# Patient Record
Sex: Female | Born: 1945 | Race: Black or African American | Hispanic: No | Marital: Single | Smoking: Former smoker
Health system: Southern US, Community
[De-identification: ages and names within clinical notes are randomized; demographics above are authoritative.]

## PROBLEM LIST (undated history)

## (undated) DIAGNOSIS — L0231 Cutaneous abscess of buttock: Secondary | ICD-10-CM

## (undated) DIAGNOSIS — L409 Psoriasis, unspecified: Secondary | ICD-10-CM

## (undated) DIAGNOSIS — K5090 Crohn's disease, unspecified, without complications: Secondary | ICD-10-CM

## (undated) DIAGNOSIS — K56609 Unspecified intestinal obstruction, unspecified as to partial versus complete obstruction: Secondary | ICD-10-CM

## (undated) DIAGNOSIS — K449 Diaphragmatic hernia without obstruction or gangrene: Secondary | ICD-10-CM

## (undated) HISTORY — PX: OTHER SURGICAL HISTORY: SHX169

---

## 1979-08-08 HISTORY — PX: APPENDECTOMY: SHX54

## 1989-08-07 HISTORY — PX: CHOLECYSTECTOMY: SHX55

## 1997-12-07 HISTORY — PX: OTHER SURGICAL HISTORY: SHX169

## 2005-02-23 ENCOUNTER — Ambulatory Visit: Payer: Self-pay | Admitting: Internal Medicine

## 2005-05-28 ENCOUNTER — Ambulatory Visit: Payer: Self-pay | Admitting: Internal Medicine

## 2005-07-22 ENCOUNTER — Ambulatory Visit: Payer: Self-pay | Admitting: Internal Medicine

## 2006-02-03 ENCOUNTER — Ambulatory Visit: Payer: Self-pay | Admitting: Internal Medicine

## 2006-02-13 ENCOUNTER — Inpatient Hospital Stay (HOSPITAL_COMMUNITY): Admission: EM | Admit: 2006-02-13 | Discharge: 2006-02-18 | Payer: Self-pay | Admitting: Emergency Medicine

## 2006-02-15 ENCOUNTER — Ambulatory Visit: Payer: Self-pay | Admitting: Internal Medicine

## 2006-02-22 ENCOUNTER — Ambulatory Visit: Payer: Self-pay | Admitting: Internal Medicine

## 2006-04-19 ENCOUNTER — Ambulatory Visit: Payer: Self-pay | Admitting: Internal Medicine

## 2006-07-08 ENCOUNTER — Ambulatory Visit: Payer: Self-pay | Admitting: Internal Medicine

## 2006-10-14 ENCOUNTER — Ambulatory Visit: Payer: Self-pay | Admitting: Internal Medicine

## 2007-02-27 IMAGING — CR DG ABDOMEN ACUTE W/ 1V CHEST
3 series · 3 of 3 positions shown · non-contrast
Comparison: none

CLINICAL DATA: Nausea, vomiting, diarrhea, and upper abdominal pain.  Status post bowel resection for rectal abscess.  Crohn's disease.
ACUTE ABDOMINAL SERIES WITH CHEST ? 3 VIEWS:

[view not recorded (1 of 3)]
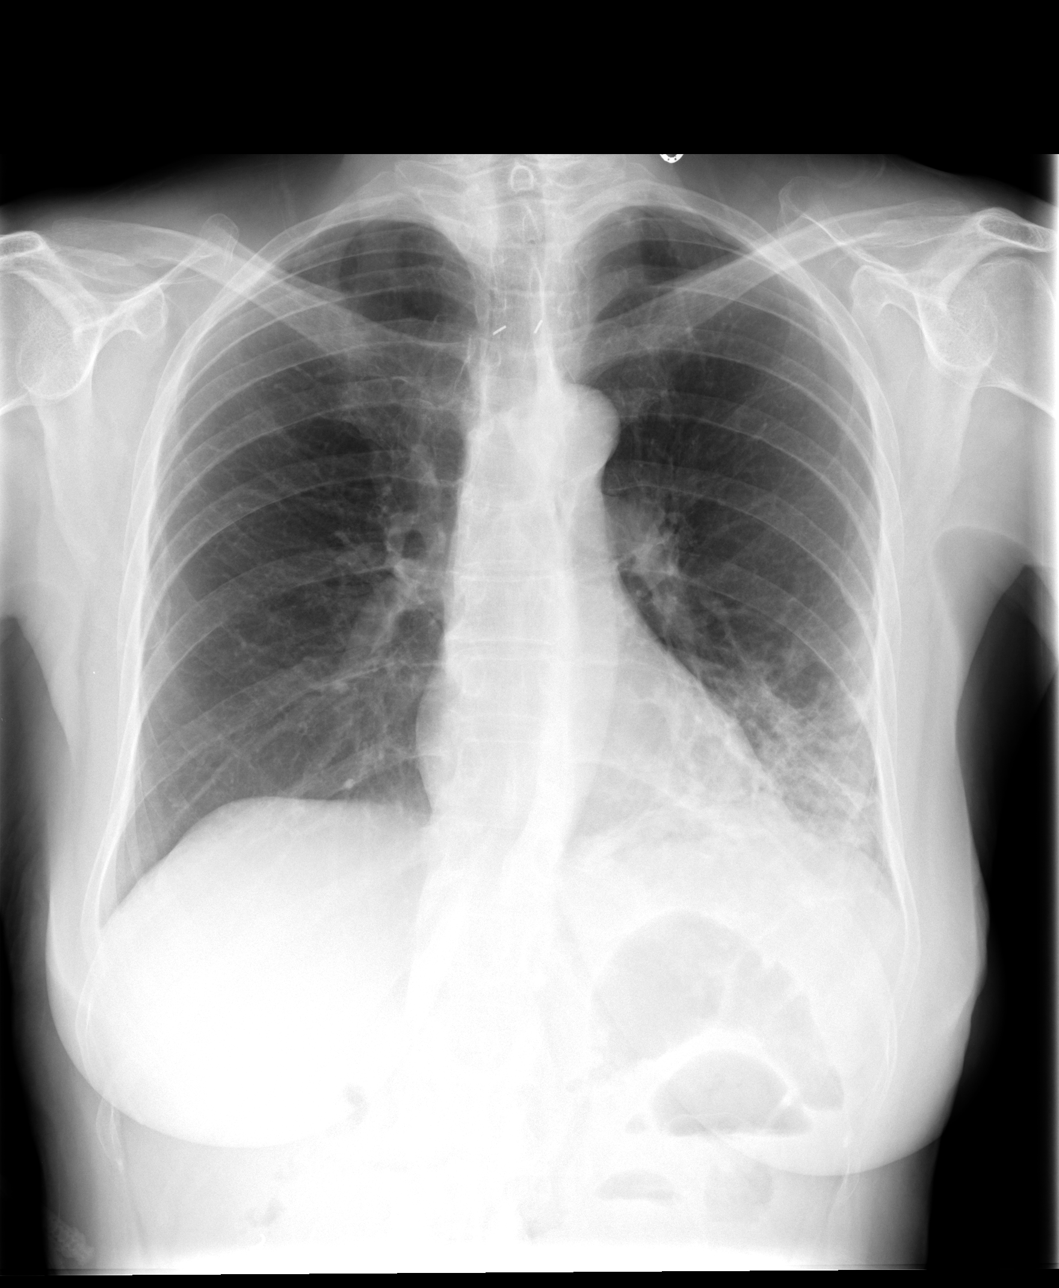

[view not recorded (2 of 3)]
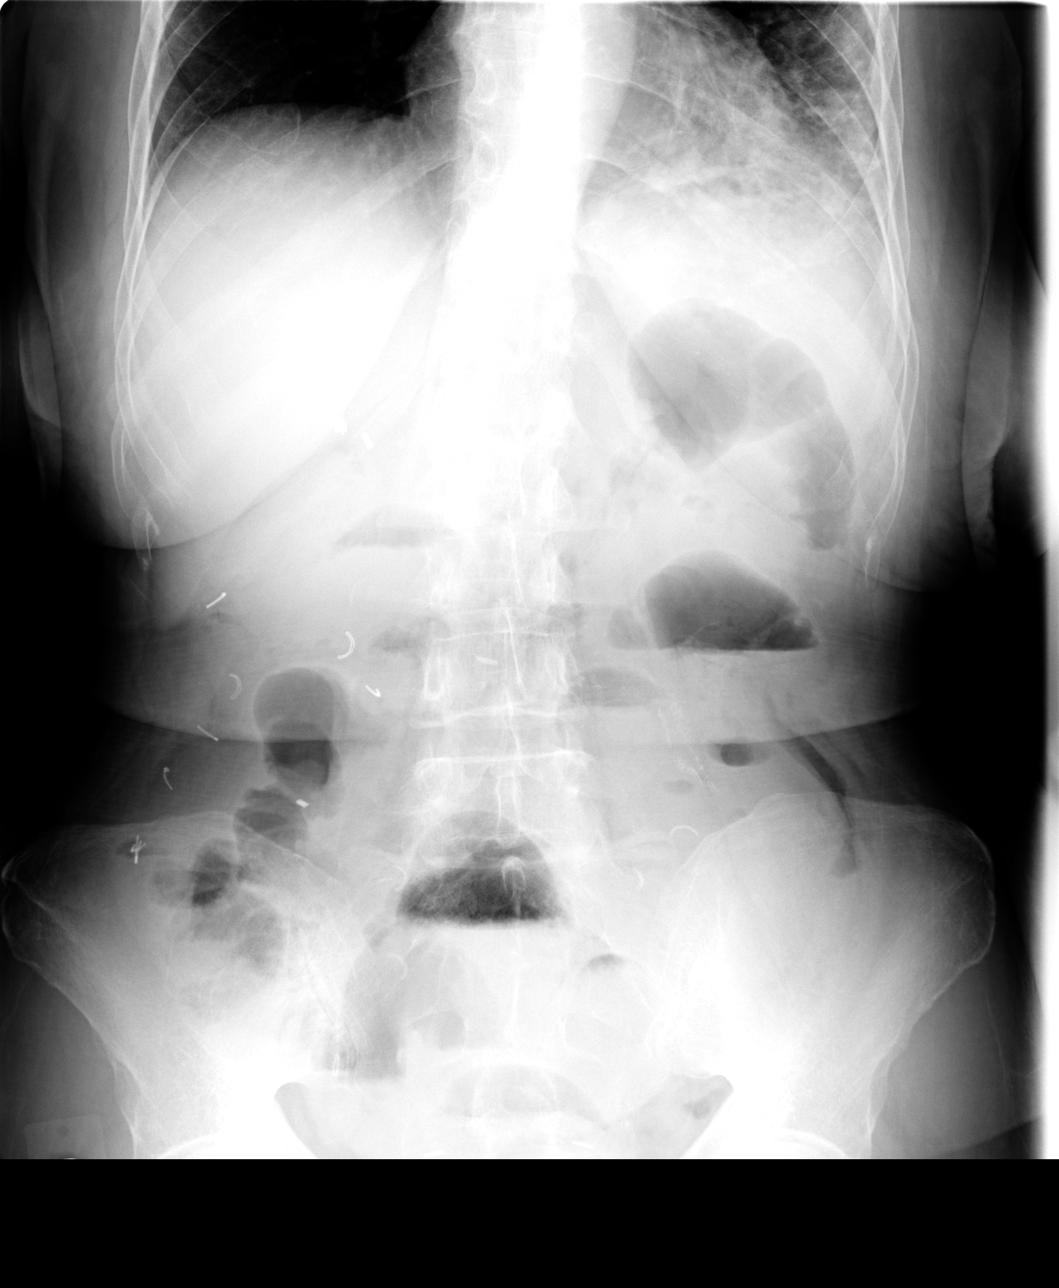

[view not recorded (3 of 3)]
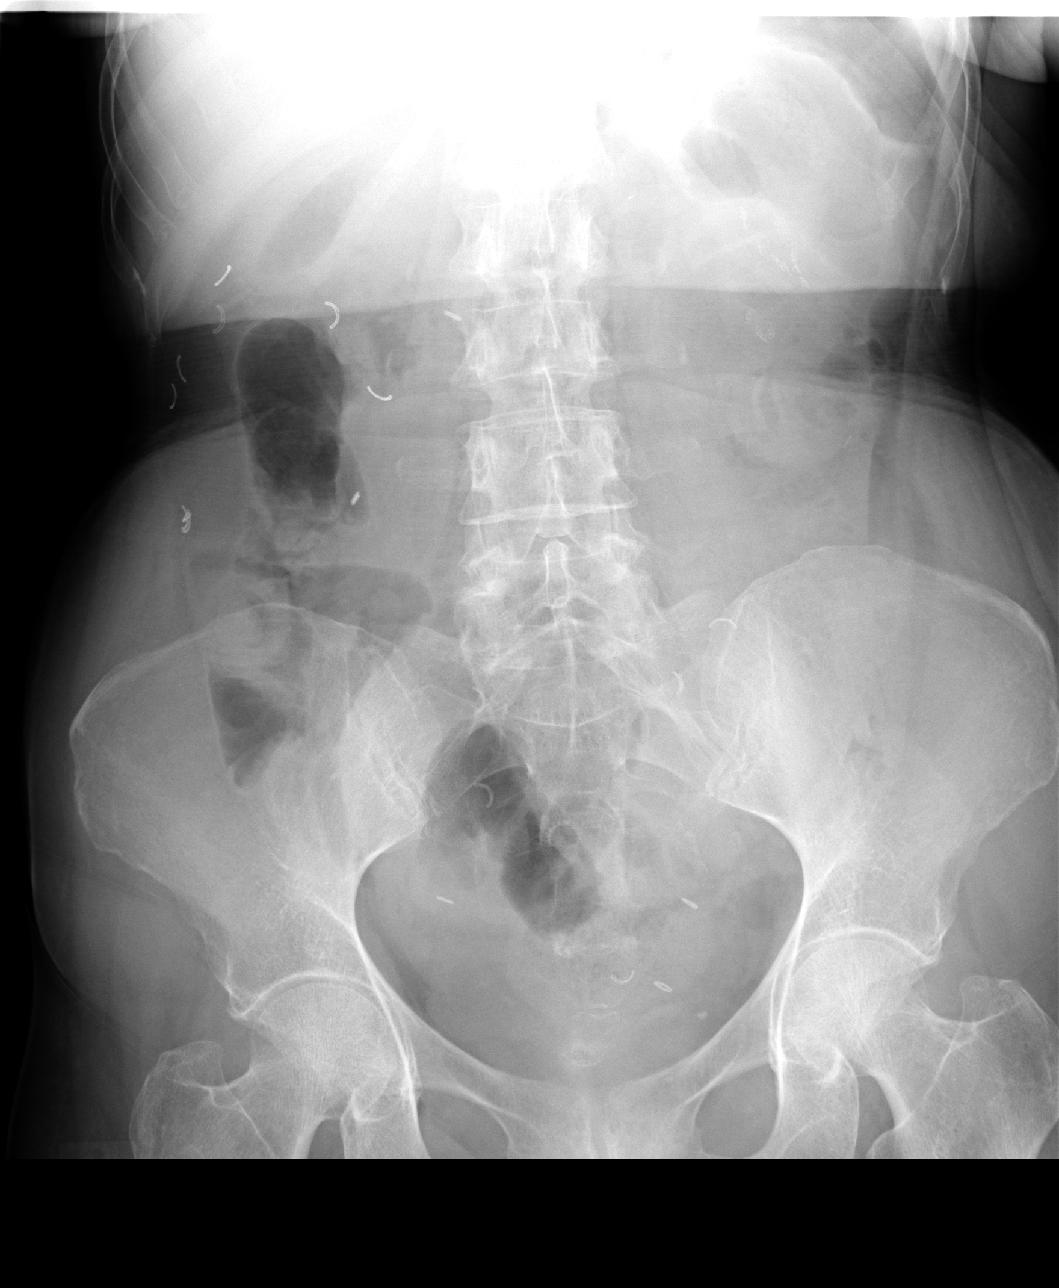

[3 of 3 positions shown; findings below may reference images not displayed]

FINDINGS: There is an irregular parenchymal density in the left lung base, the acuteness of which is uncertain.  This may represent pneumonia however, it is possible that chronic scarring may produce this appearance.  No free intraperitoneal air.  There are dilated loops of small bowel in the upper and lower abdomen with air-fluid levels on the erect view.  There is a paucity of colonic gas.  The findings are suspicious for small bowel obstruction.
IMPRESSION: Radiographic findings would be compatible with small bowel obstruction.  
Left lower lung zone density suspicious for pneumonia until proven otherwise.
Unfortunately, there are no prior chest x-rays available for correlation.
Consider follow-up chest post-treatment to reassess the left base density.

## 2007-03-01 IMAGING — CR DG ABDOMEN ACUTE W/ 1V CHEST
3 series · 3 of 3 positions shown · non-contrast
Comparison: 02/13/06.

CLINICAL DATA: Abdominal pain. 
 ACUTE ABDOMINAL SERIES:

[view not recorded (1 of 3)]
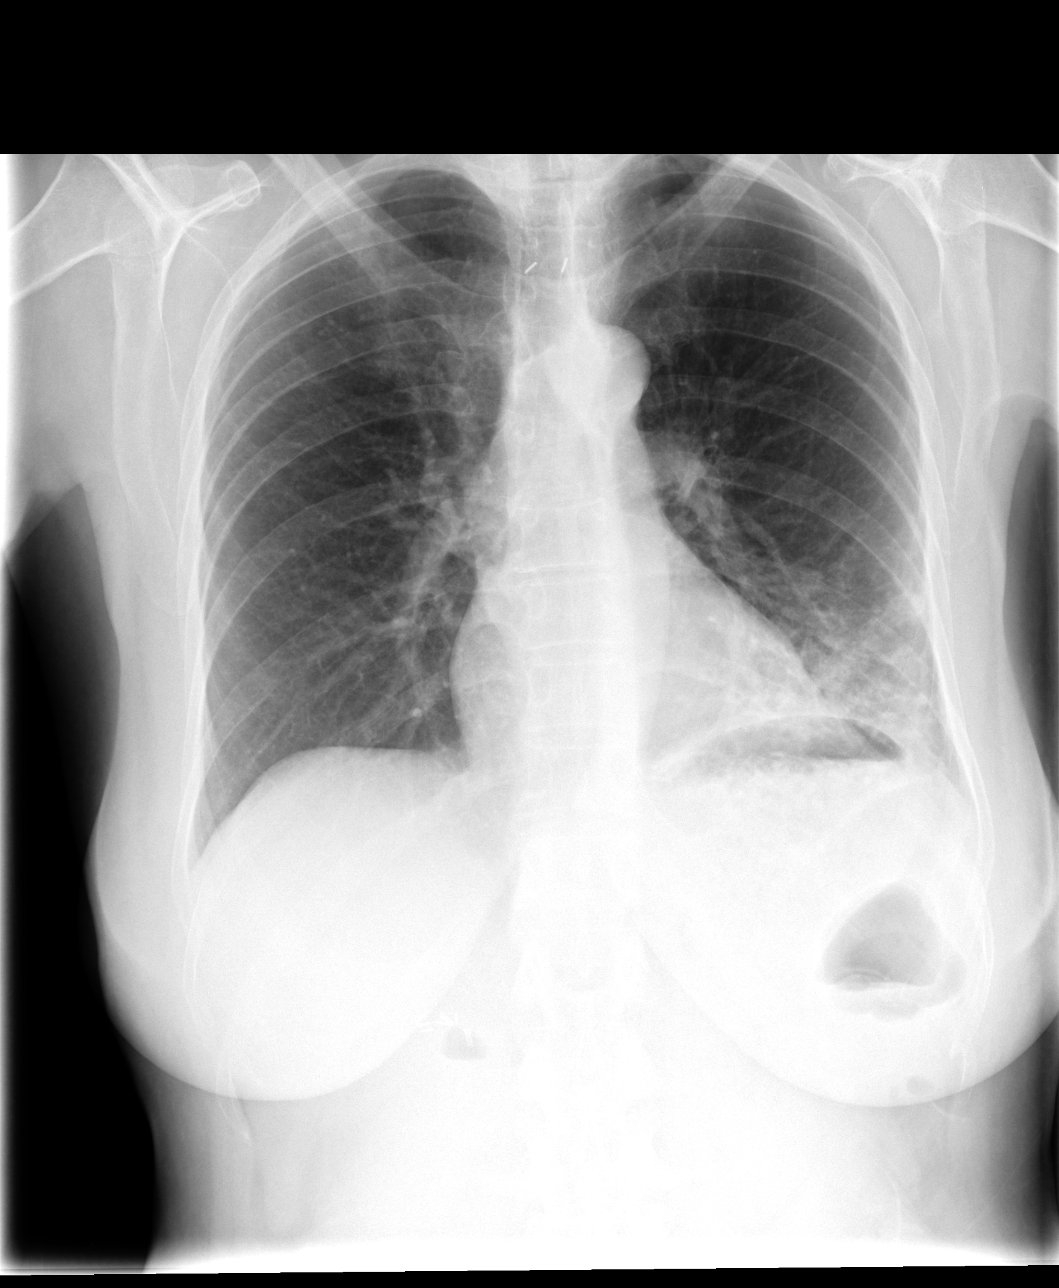

[view not recorded (2 of 3)]
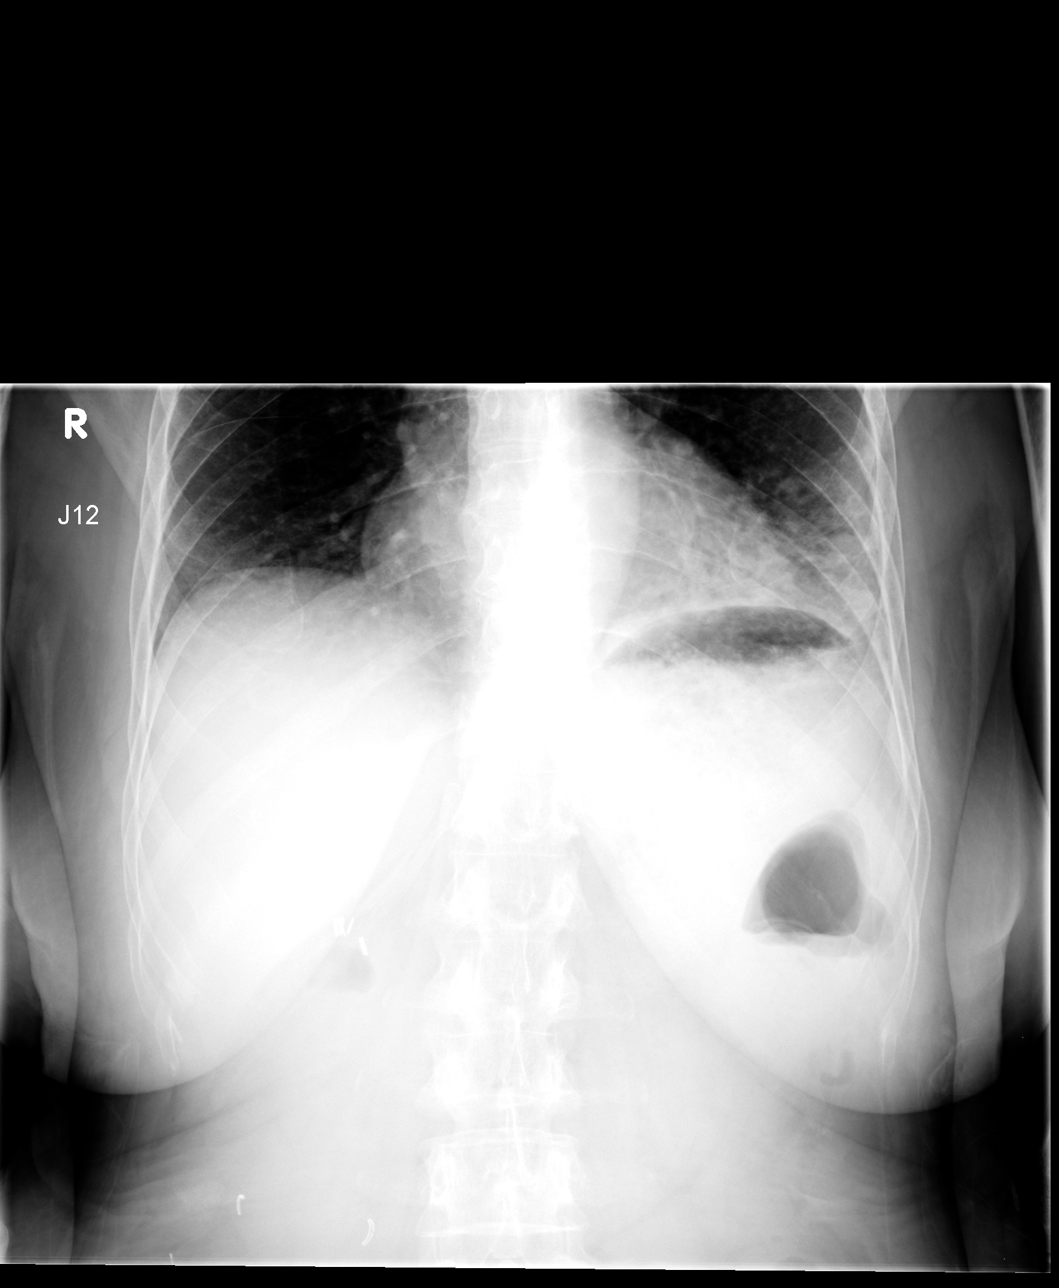

[view not recorded (3 of 3)]
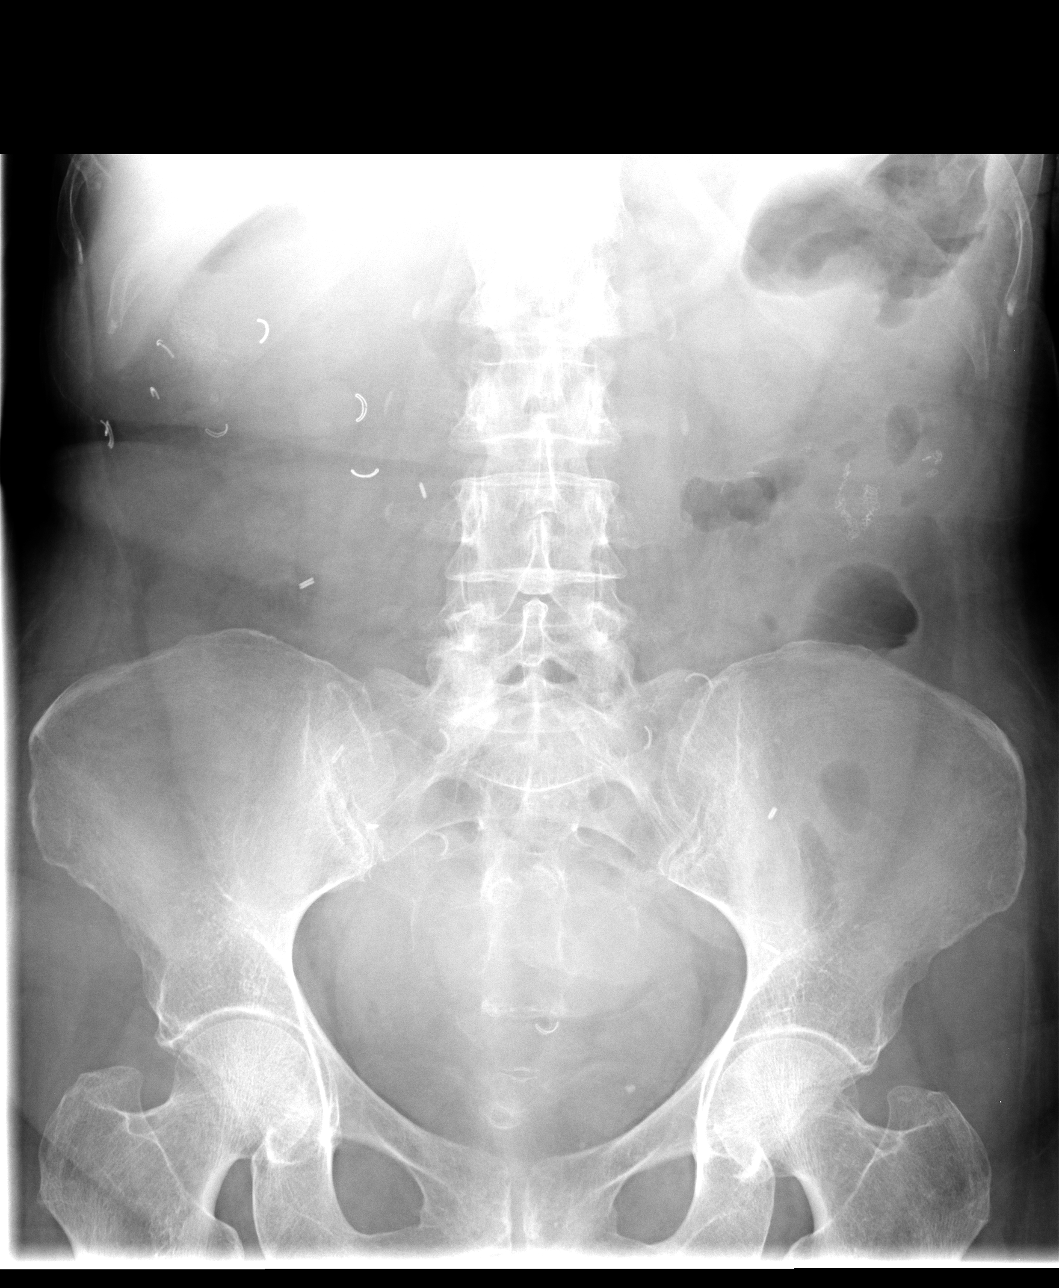

[3 of 3 positions shown; findings below may reference images not displayed]

FINDINGS: Prominent markings remain at the left lung base.  Although this could be due to atelectasis or scarring, left lower lobe pneumonia is also a consideration.  The right lung is clear.  The heart is within normal limits is size.  
 The slight small bowel distention appears improved.  On the erect view no significant air fluid levels are seen and no free air is noted.
IMPRESSION: 1.  Opacity remains at the left base suspicious for left lower lobe pneumonia. 
 2.  Improvement in small bowel distention.  No free air.

## 2007-08-22 ENCOUNTER — Emergency Department (HOSPITAL_COMMUNITY): Admission: EM | Admit: 2007-08-22 | Discharge: 2007-08-22 | Payer: Self-pay | Admitting: Emergency Medicine

## 2007-10-18 ENCOUNTER — Emergency Department (HOSPITAL_COMMUNITY): Admission: EM | Admit: 2007-10-18 | Discharge: 2007-10-18 | Payer: Self-pay | Admitting: *Deleted

## 2007-11-23 ENCOUNTER — Emergency Department (HOSPITAL_COMMUNITY): Admission: EM | Admit: 2007-11-23 | Discharge: 2007-11-23 | Payer: Self-pay | Admitting: Emergency Medicine

## 2007-12-08 HISTORY — PX: COLOSTOMY TAKEDOWN: SHX5783

## 2007-12-08 HISTORY — PX: BOWEL RESECTION: SHX1257

## 2007-12-08 HISTORY — PX: COLOSTOMY: SHX63

## 2007-12-14 ENCOUNTER — Emergency Department (HOSPITAL_COMMUNITY): Admission: EM | Admit: 2007-12-14 | Discharge: 2007-12-14 | Payer: Self-pay | Admitting: *Deleted

## 2008-05-20 ENCOUNTER — Emergency Department (HOSPITAL_COMMUNITY): Admission: EM | Admit: 2008-05-20 | Discharge: 2008-05-21 | Payer: Self-pay | Admitting: Emergency Medicine

## 2008-06-10 ENCOUNTER — Emergency Department (HOSPITAL_COMMUNITY): Admission: EM | Admit: 2008-06-10 | Discharge: 2008-06-11 | Payer: Self-pay | Admitting: Emergency Medicine

## 2008-08-15 ENCOUNTER — Emergency Department (HOSPITAL_COMMUNITY): Admission: EM | Admit: 2008-08-15 | Discharge: 2008-08-16 | Payer: Self-pay | Admitting: Emergency Medicine

## 2008-10-31 IMAGING — CR DG ABDOMEN ACUTE W/ 1V CHEST
3 series · 3 of 3 positions shown · non-contrast
Comparison: 02/15/06.

CLINICAL DATA: 60-year-old female, Crohn?s disease.  Abdominal pain.  
ACUTE ABDOMINAL SERIES - 3 VIEW;

[w chest pa]
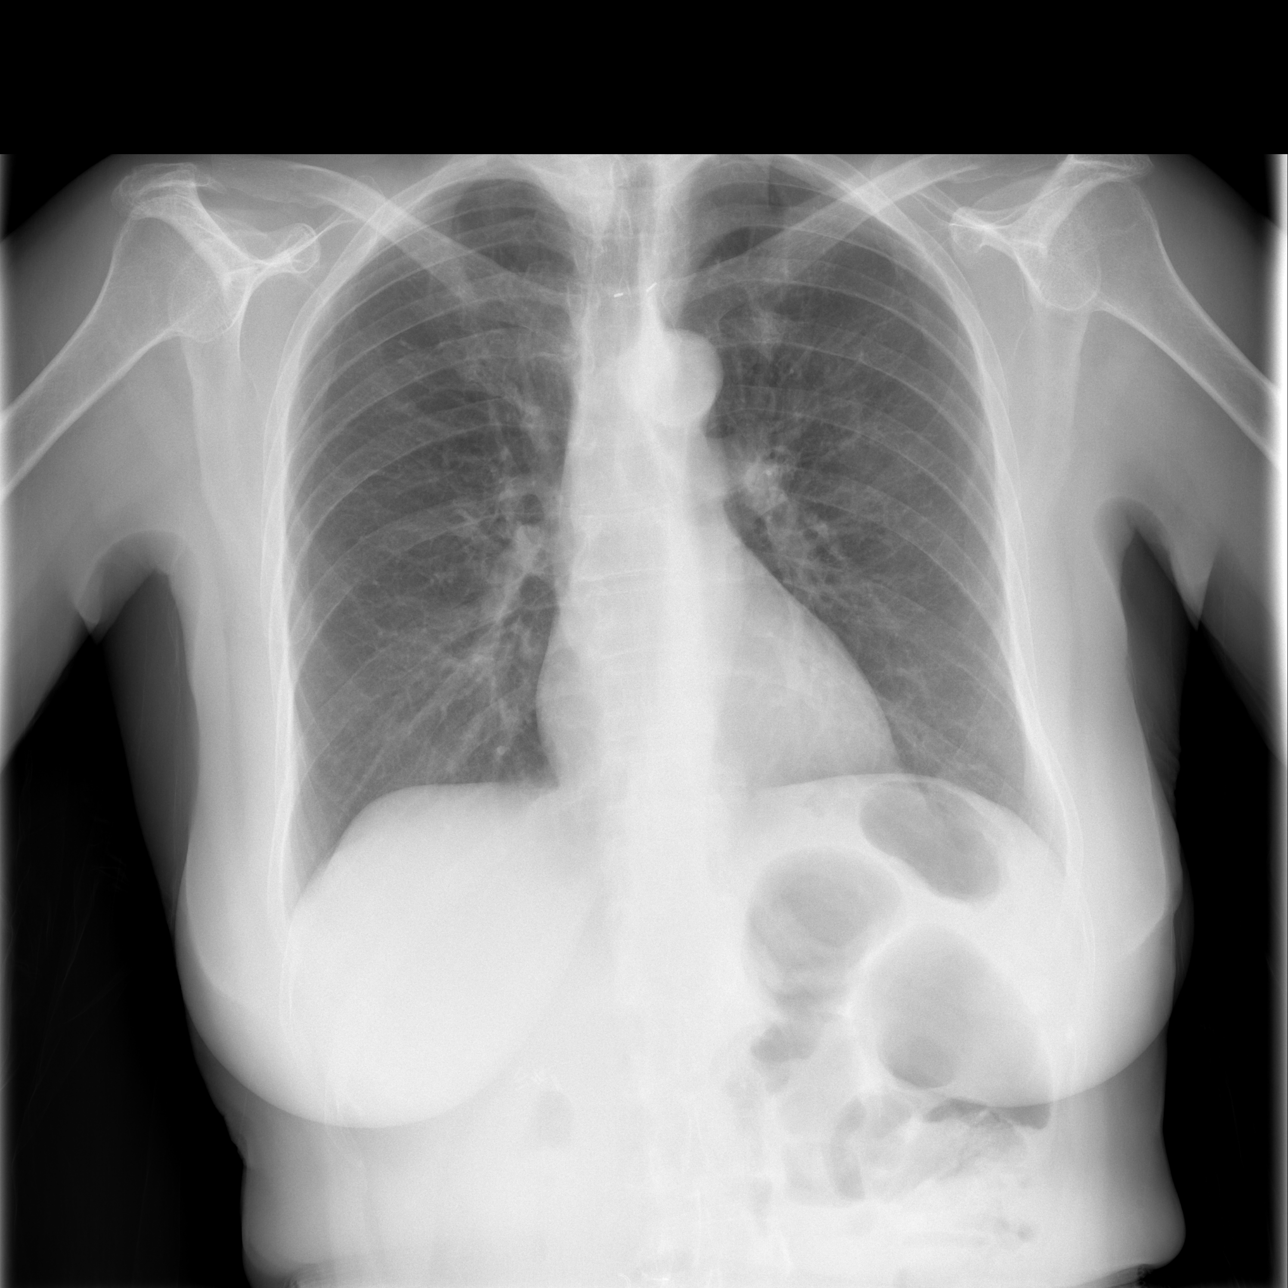

[w abdomen upright]
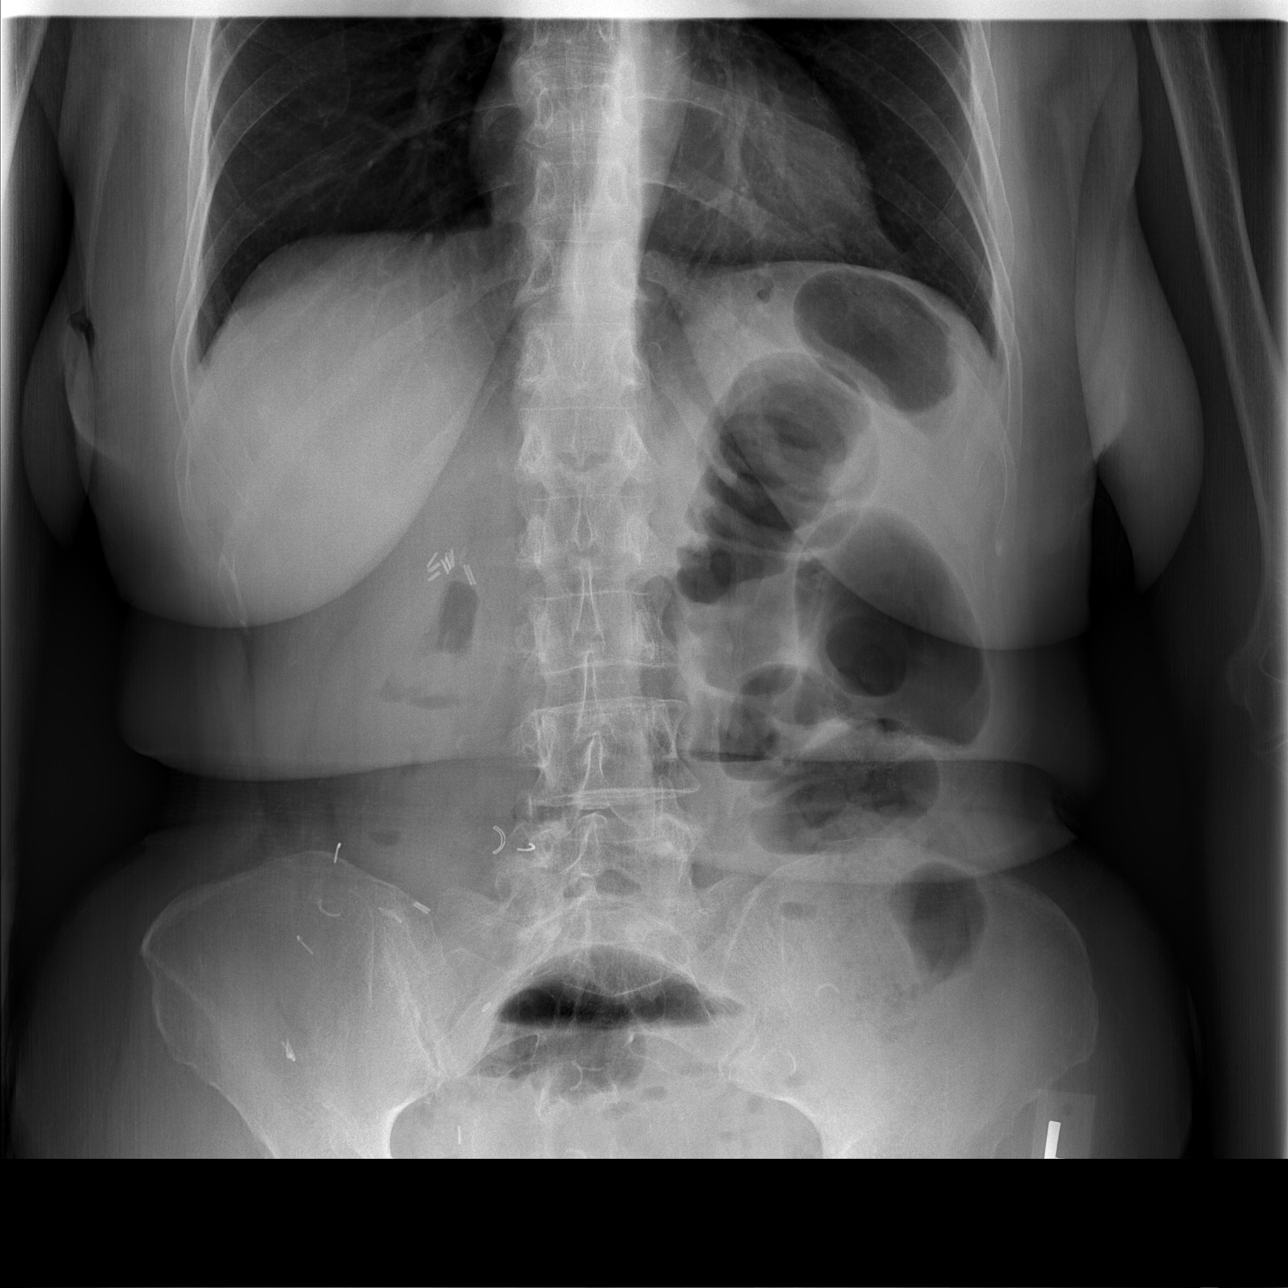

[t abdomen supine]
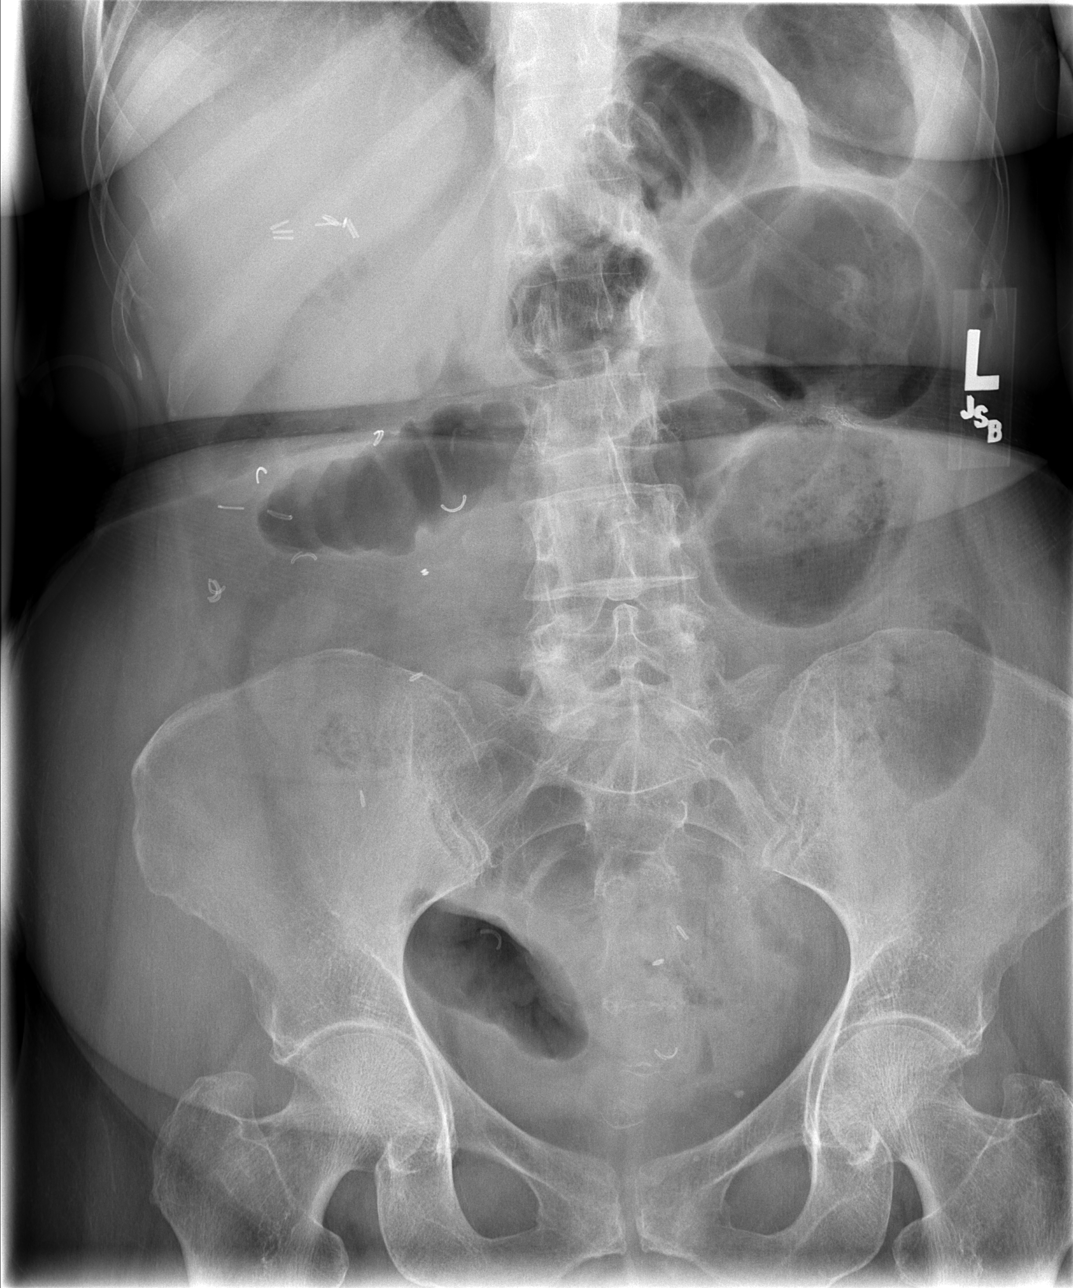

[3 of 3 positions shown; findings below may reference images not displayed]

FINDINGS: Clear lungs.  Normal heart size.  Negative for pneumonia, edema, effusion, or pneumothorax.  No free air. 
Patient is status post cholecystectomy.  Previous lower abdominal surgery with surgical clips noted.  Dilated small bowel is present with air fluid levels on the upright exam.  Small bowel diameter is roughly 7 cm.  Colon is decompressed.  Findings are consistent with small bowel obstruction.
IMPRESSION: 1.  Small bowel obstruction.  
2.  No free air.  
3.  No acute chest finding.

## 2008-11-12 ENCOUNTER — Ambulatory Visit: Payer: Self-pay | Admitting: Internal Medicine

## 2008-11-12 ENCOUNTER — Inpatient Hospital Stay (HOSPITAL_COMMUNITY): Admission: EM | Admit: 2008-11-12 | Discharge: 2008-11-15 | Payer: Self-pay | Admitting: Emergency Medicine

## 2008-11-26 ENCOUNTER — Emergency Department (HOSPITAL_COMMUNITY): Admission: EM | Admit: 2008-11-26 | Discharge: 2008-11-26 | Payer: Self-pay | Admitting: Emergency Medicine

## 2008-12-06 IMAGING — CR DG ABDOMEN ACUTE W/ 1V CHEST
3 series · 3 of 3 positions shown · non-contrast
Comparison: 10/18/2007.

CLINICAL DATA: 60 year-old female with abdominal pain, Crohn?s disease, nausea, vomiting and diarrhea.
 ACUTE ABDOMINAL SERIES WITH CHEST? 3 VIEW:

[w chest pa]
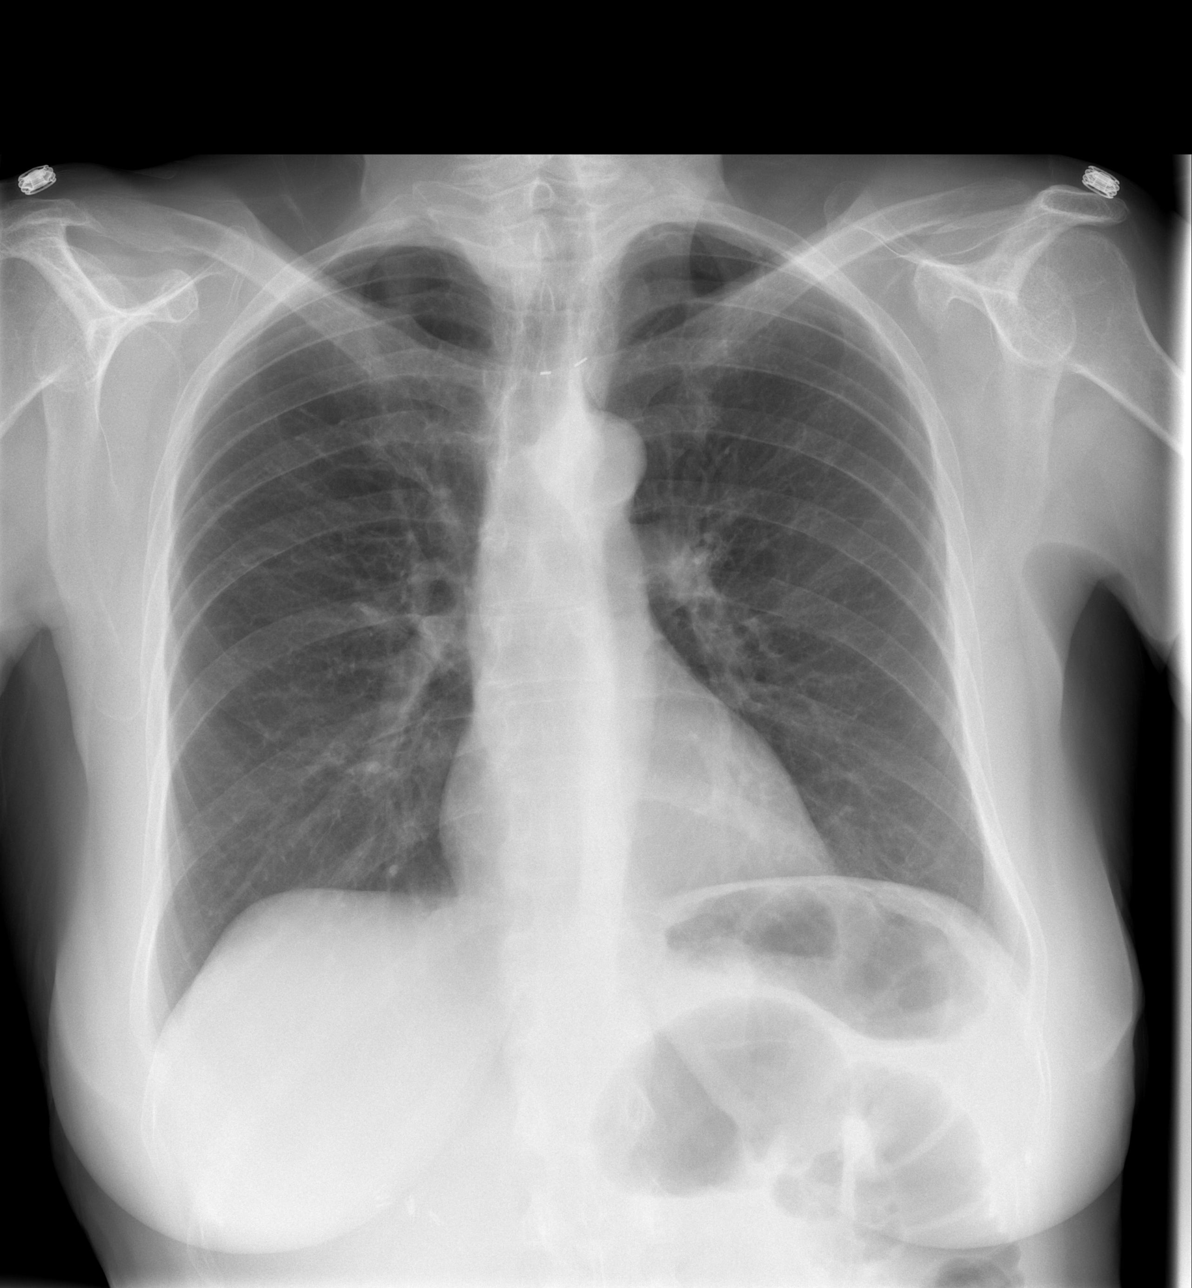

[w abdomen upright]
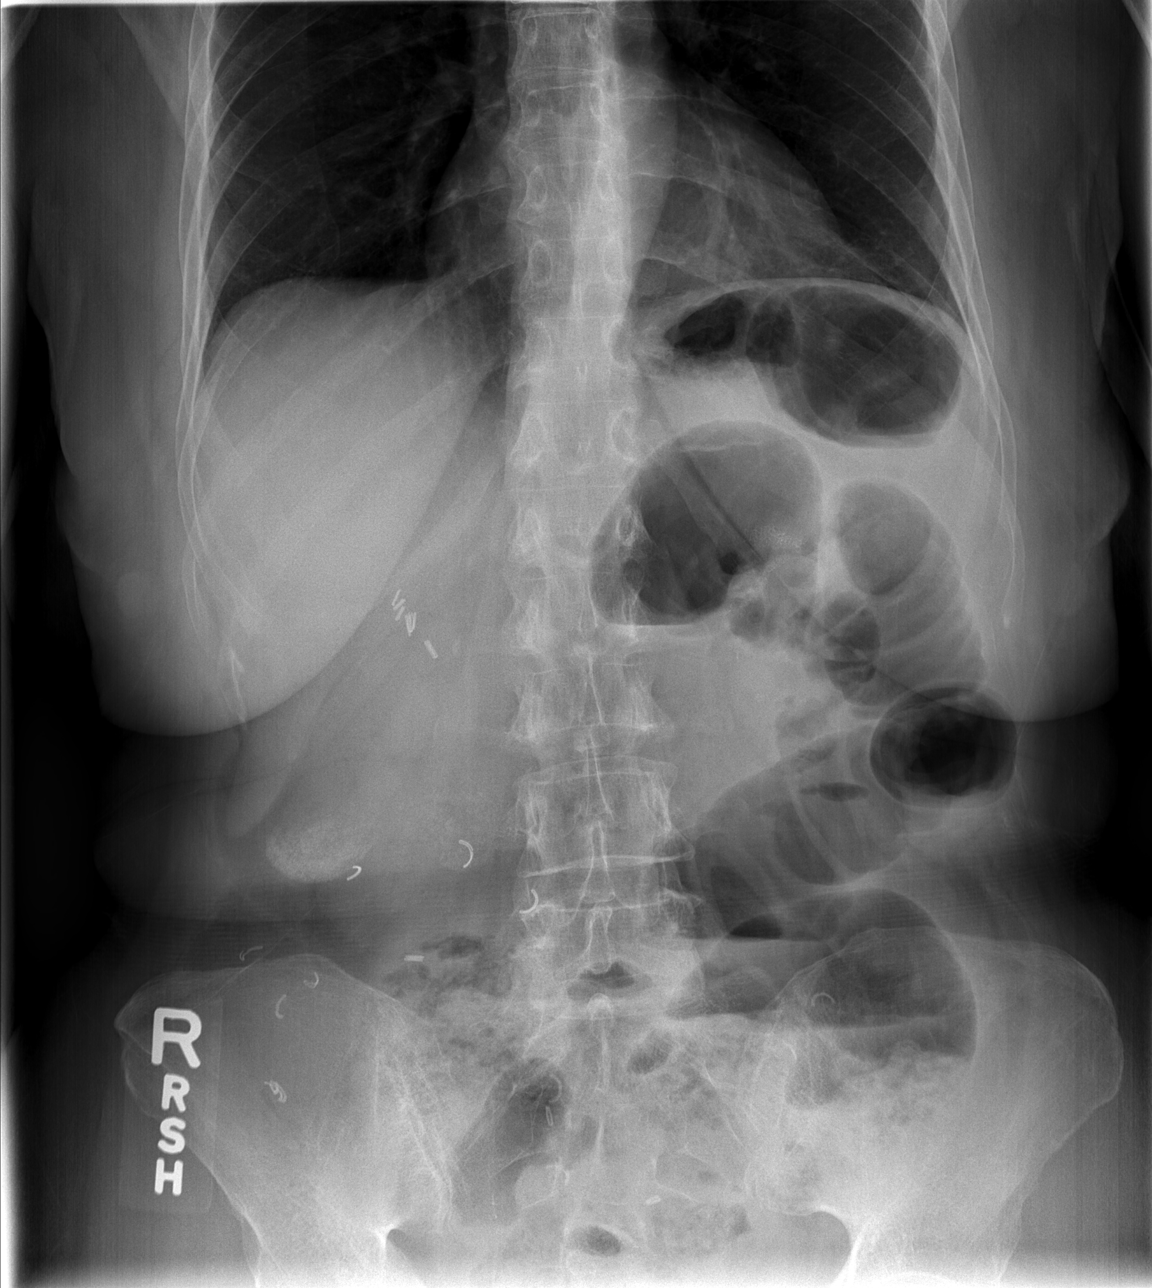

[t abdomen supine]
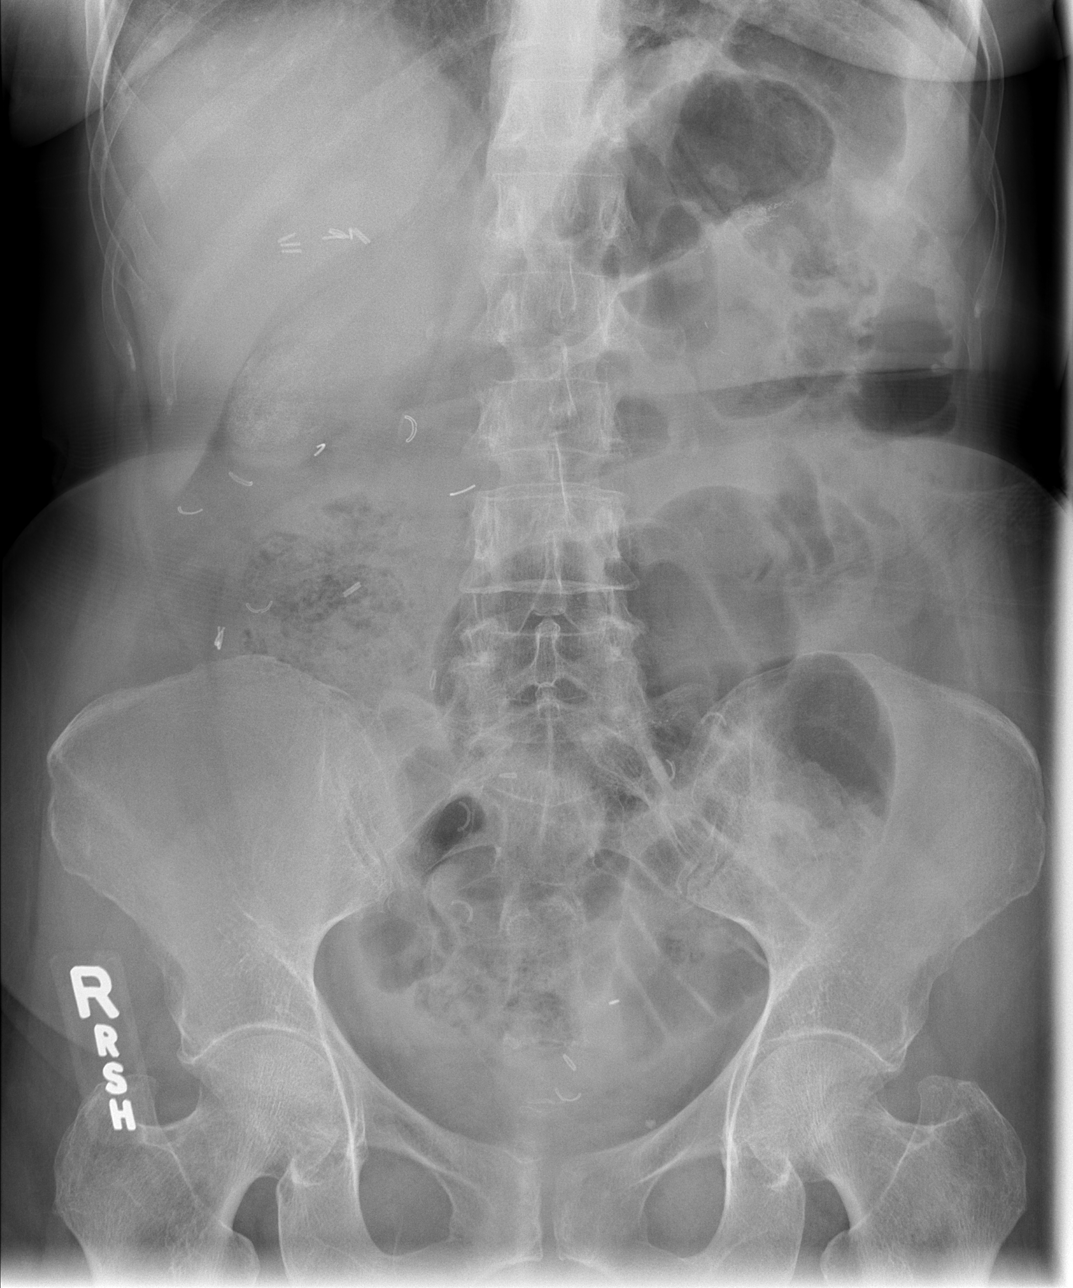

[3 of 3 positions shown; findings below may reference images not displayed]

FINDINGS: Normal heart size and vascularity.  No acute pneumonia, edema, effusion or pneumothorax.  Clips are noted over the superior mediastinum.  Chest exam is stable.
 Clips are present in the abdomen from prior bowel surgery and likely cholecystectomy.  No free air identified.  Dilated small bowel is noted in the left abdomen diffusely with air-fluid levels suspicious for small bowel obstruction.  On the supine view, a small bowel loop is dilated to nearly 9cm.  Colon is relatively decompressed.
IMPRESSION: 1. Dilated small bowel with air-fluid levels consistent with recurrent small bowel obstruction.
 2. No free air.
 3. No acute chest finding.

## 2009-02-07 ENCOUNTER — Emergency Department (HOSPITAL_COMMUNITY): Admission: EM | Admit: 2009-02-07 | Discharge: 2009-02-07 | Payer: Self-pay | Admitting: Emergency Medicine

## 2009-02-11 ENCOUNTER — Inpatient Hospital Stay (HOSPITAL_COMMUNITY): Admission: EM | Admit: 2009-02-11 | Discharge: 2009-02-14 | Payer: Self-pay | Admitting: Emergency Medicine

## 2009-02-12 ENCOUNTER — Ambulatory Visit: Payer: Self-pay | Admitting: Gastroenterology

## 2009-04-20 ENCOUNTER — Emergency Department (HOSPITAL_COMMUNITY): Admission: EM | Admit: 2009-04-20 | Discharge: 2009-04-20 | Payer: Self-pay | Admitting: Emergency Medicine

## 2009-04-21 ENCOUNTER — Emergency Department (HOSPITAL_COMMUNITY): Admission: EM | Admit: 2009-04-21 | Discharge: 2009-04-22 | Payer: Self-pay | Admitting: Emergency Medicine

## 2009-04-30 ENCOUNTER — Emergency Department (HOSPITAL_COMMUNITY): Admission: EM | Admit: 2009-04-30 | Discharge: 2009-04-30 | Payer: Self-pay | Admitting: Emergency Medicine

## 2009-05-02 ENCOUNTER — Inpatient Hospital Stay (HOSPITAL_COMMUNITY): Admission: EM | Admit: 2009-05-02 | Discharge: 2009-05-05 | Payer: Self-pay | Admitting: Emergency Medicine

## 2009-05-02 ENCOUNTER — Ambulatory Visit: Payer: Self-pay | Admitting: Internal Medicine

## 2009-05-14 ENCOUNTER — Emergency Department (HOSPITAL_COMMUNITY): Admission: EM | Admit: 2009-05-14 | Discharge: 2009-05-14 | Payer: Self-pay | Admitting: Emergency Medicine

## 2009-05-15 ENCOUNTER — Inpatient Hospital Stay (HOSPITAL_COMMUNITY): Admission: EM | Admit: 2009-05-15 | Discharge: 2009-05-17 | Payer: Self-pay | Admitting: Emergency Medicine

## 2009-07-09 ENCOUNTER — Ambulatory Visit: Payer: Self-pay | Admitting: Internal Medicine

## 2009-07-09 ENCOUNTER — Inpatient Hospital Stay (HOSPITAL_COMMUNITY): Admission: EM | Admit: 2009-07-09 | Discharge: 2009-07-10 | Payer: Self-pay | Admitting: Emergency Medicine

## 2009-07-15 ENCOUNTER — Emergency Department (HOSPITAL_COMMUNITY): Admission: EM | Admit: 2009-07-15 | Discharge: 2009-07-16 | Payer: Self-pay | Admitting: Emergency Medicine

## 2009-07-25 ENCOUNTER — Ambulatory Visit: Payer: Self-pay | Admitting: Vascular Surgery

## 2009-07-25 ENCOUNTER — Emergency Department (HOSPITAL_COMMUNITY): Admission: EM | Admit: 2009-07-25 | Discharge: 2009-07-25 | Payer: Self-pay | Admitting: Emergency Medicine

## 2009-07-25 ENCOUNTER — Encounter (INDEPENDENT_AMBULATORY_CARE_PROVIDER_SITE_OTHER): Payer: Self-pay | Admitting: Emergency Medicine

## 2009-08-29 IMAGING — CR DG ABDOMEN ACUTE W/ 1V CHEST
3 series · 3 of 3 positions shown · non-contrast
Comparison: 11/23/2007

CLINICAL DATA: Abdominal pain.  Crohn's disease.

ACUTE ABDOMEN SERIES (ABDOMEN 2 VIEW & CHEST 1 VIEW)

[w chest pa]
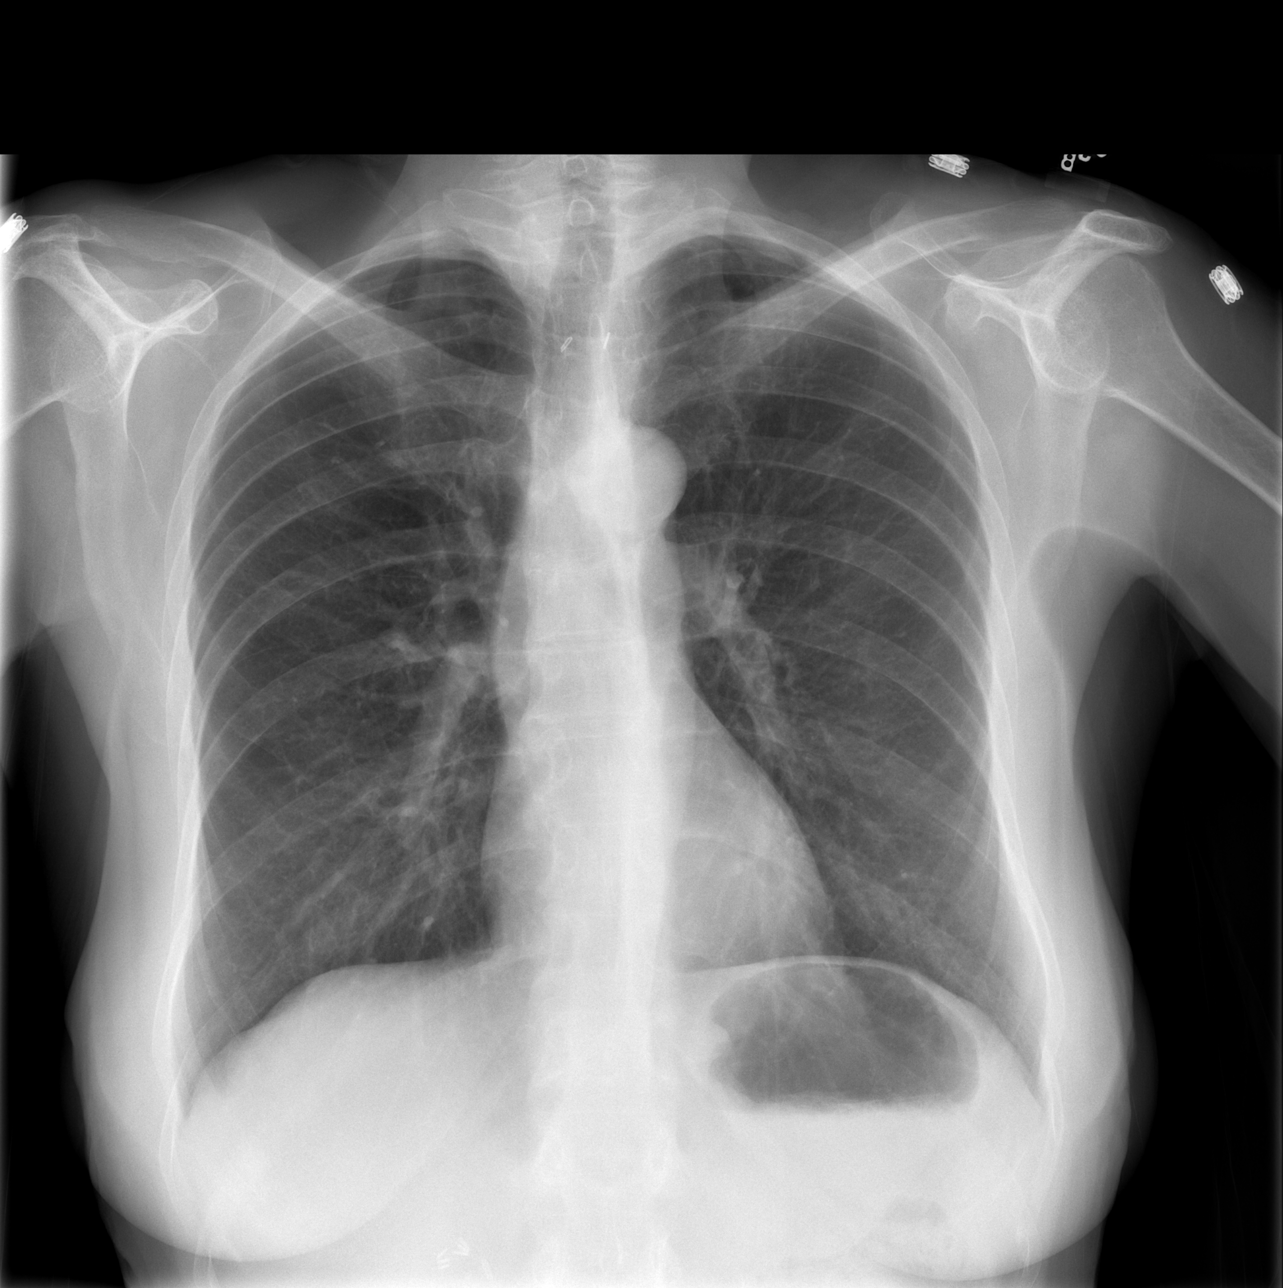

[w abdomen upright]
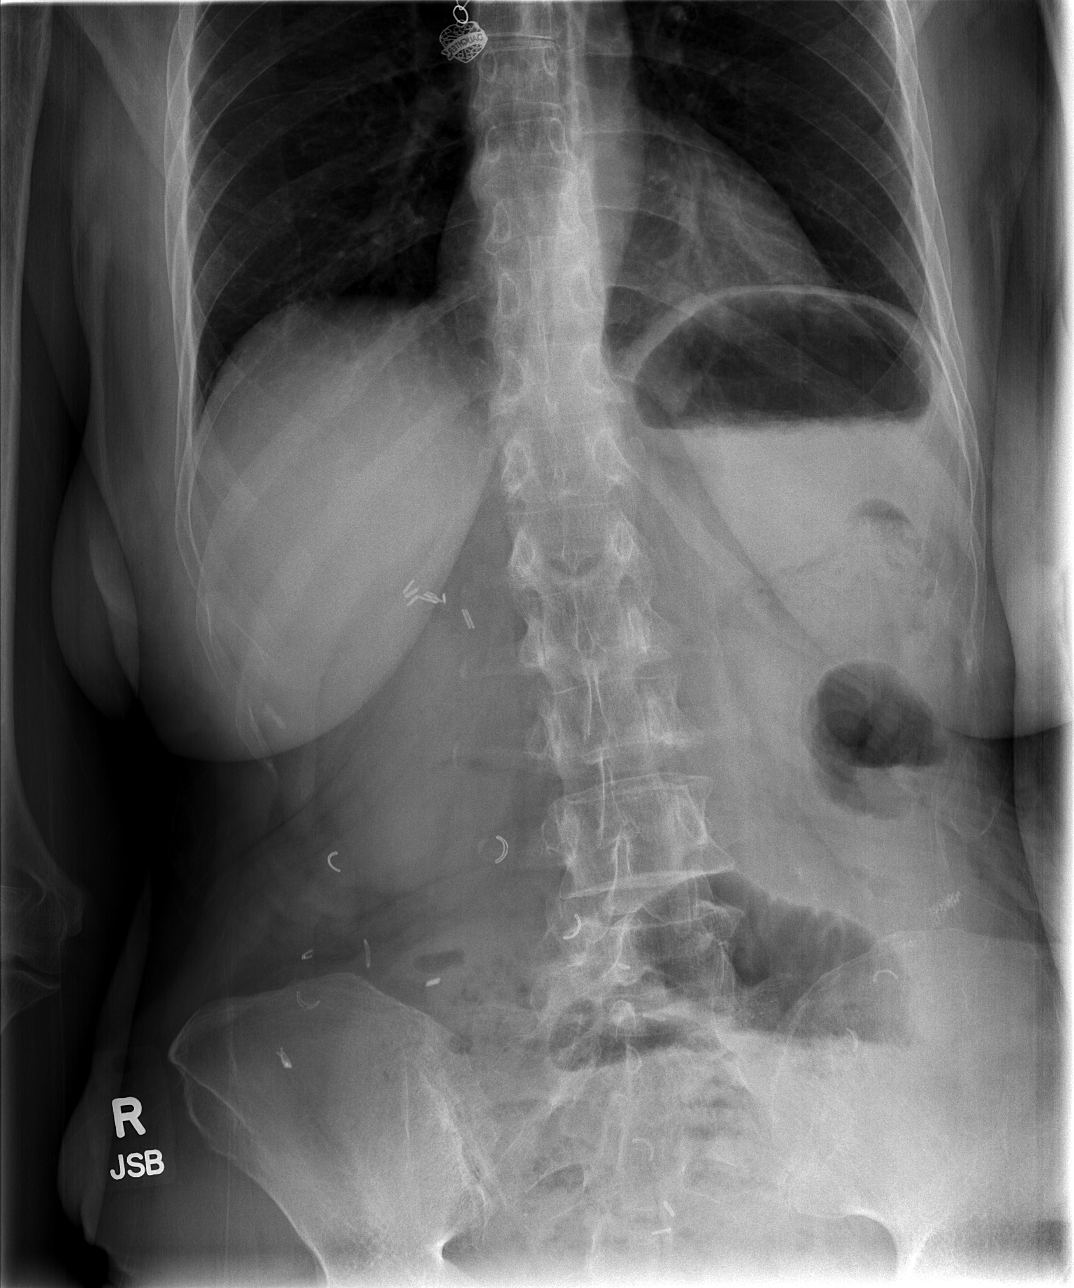

[t abdomen supine]
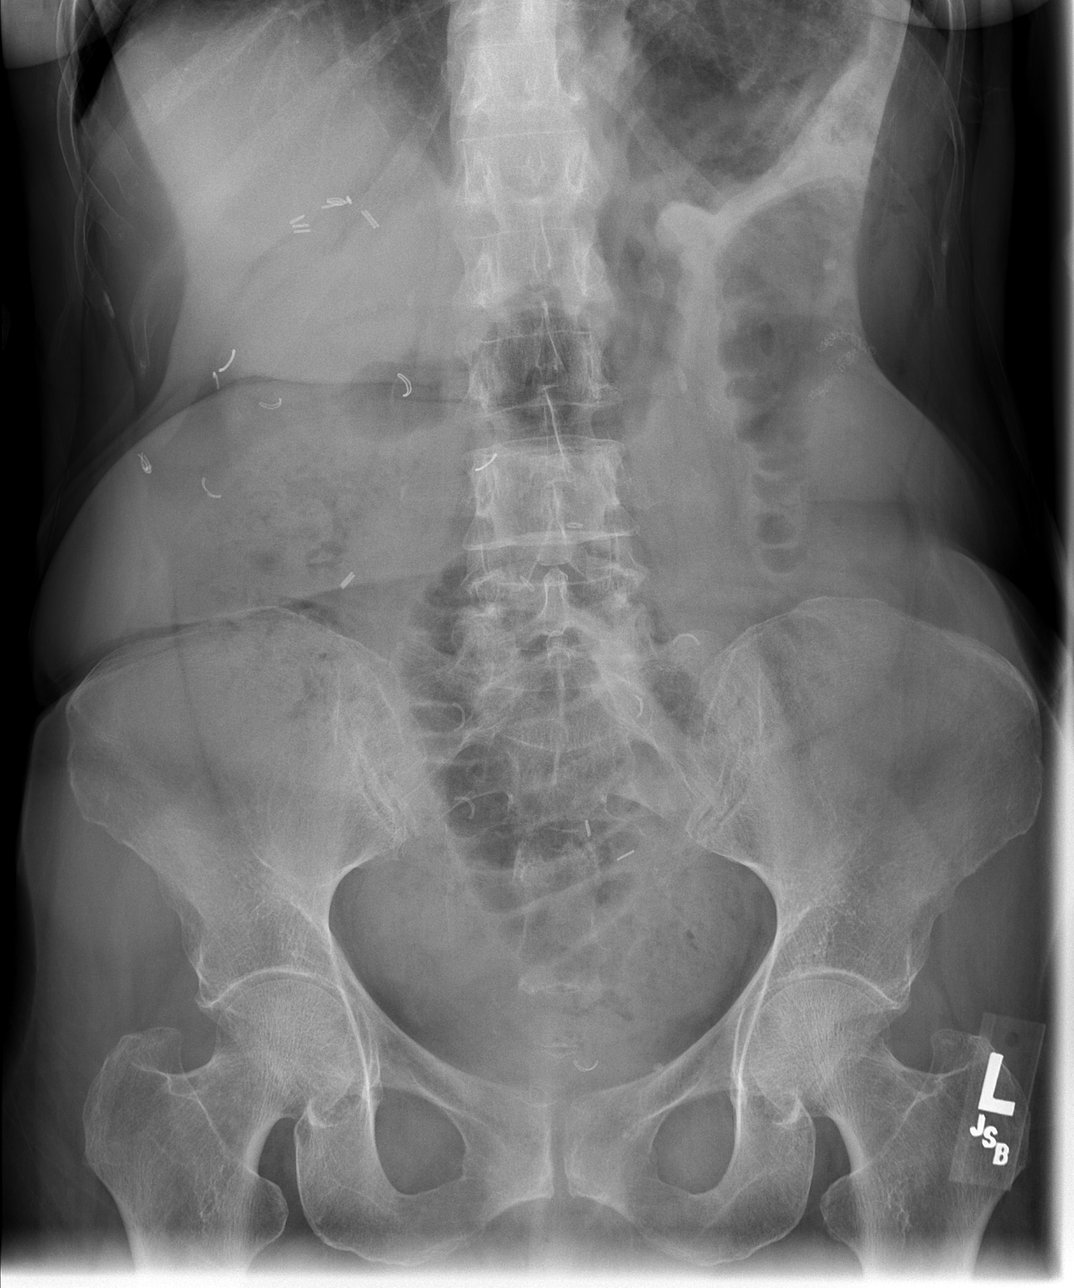

[3 of 3 positions shown; findings below may reference images not displayed]

FINDINGS: The cardiomediastinal silhouette is unremarkable.
The lungs are clear.
Clips within the upper mediastinum are again identified.
No pleural effusions or pneumothorax identified.

A nonspecific dilated loop of small bowel within the central lower
abdomen is identified.
No other dilated loops of bowel are identified.
Surgical clips and sutures within the abdomen and pelvis are again
noted.
There is no evidence of pneumoperitoneum.
No acute bony abnormalities are identified.
IMPRESSION: Distended small bowel loop within the central lower abdomen -
question focal small bowel abnormality or ileus.

No evidence of pneumoperitoneum.

No evidence of acute cardiopulmonary disease.

## 2009-08-30 IMAGING — CT CT PELVIS W/ CM
2 of 5 series · 11 of 36 positions shown, 18 images · IV contrast (water/omni  & 80 ml omni 300)
Comparison: None

CT ABDOMEN

CLINICAL DATA: Abdominal and pelvic pain with nausea vomiting.
History of Crohn disease with multiple bowel surgeries including
colon resection..

CT ABDOMEN AND PELVIS WITH CONTRAST
TECHNIQUE: Multidetector CT imaging of the abdomen and pelvis was
performed using the standard protocol following bolus
administration of intravenous contrast.
Contrast: 80 ml intravenous Ymnipaque-N00.

[Series 2: routine abdomen · axial · 0.70mm/px · z∈[-396,-56]mm · 10 of 84 slices shown, 16 images]
[im 8/84  soft-tissue]
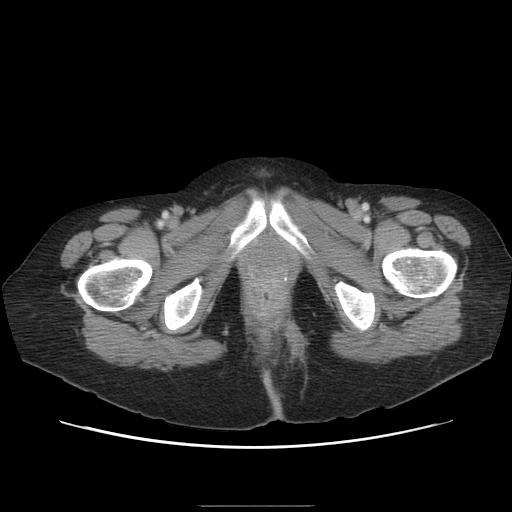
[im 8/84  bone]
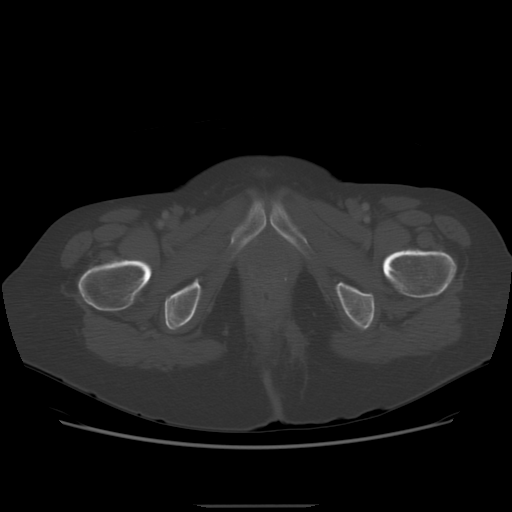
[im 16/84  soft-tissue]
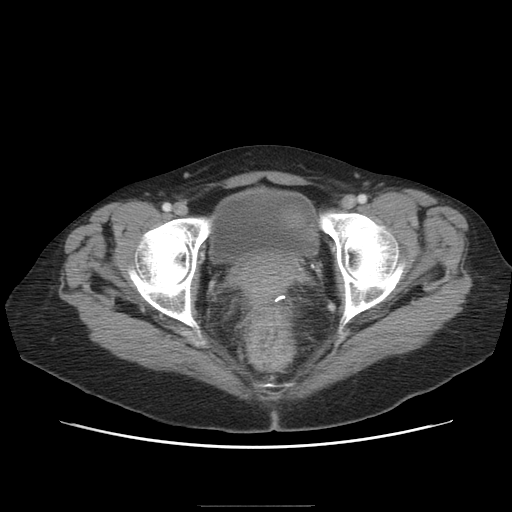
[im 23/84  soft-tissue]
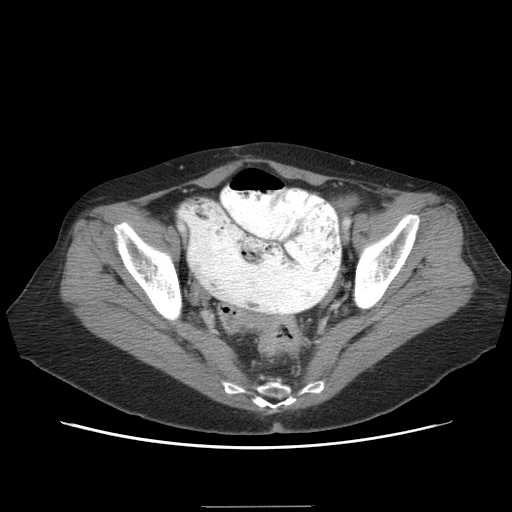
[im 31/84  soft-tissue]
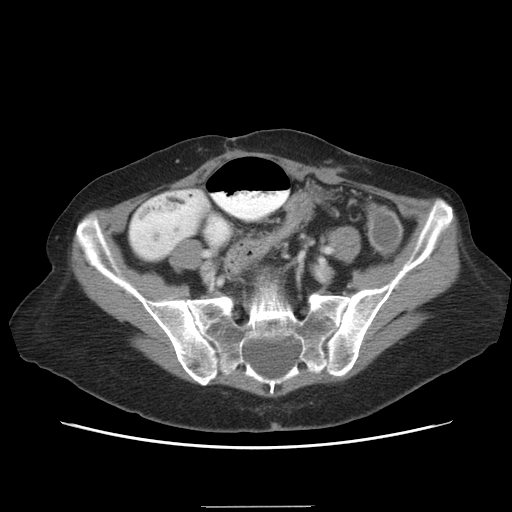
[im 38/84  soft-tissue]
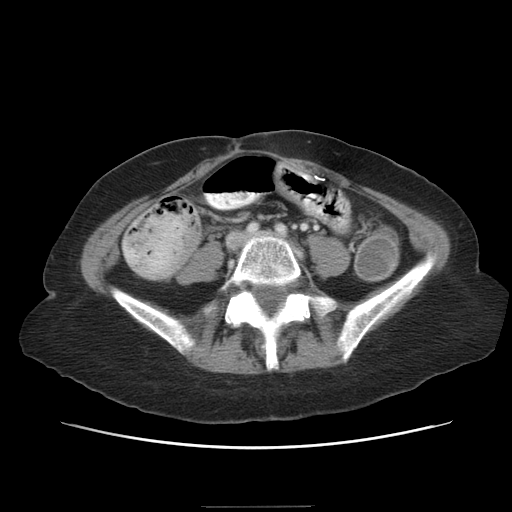
[im 46/84  soft-tissue]
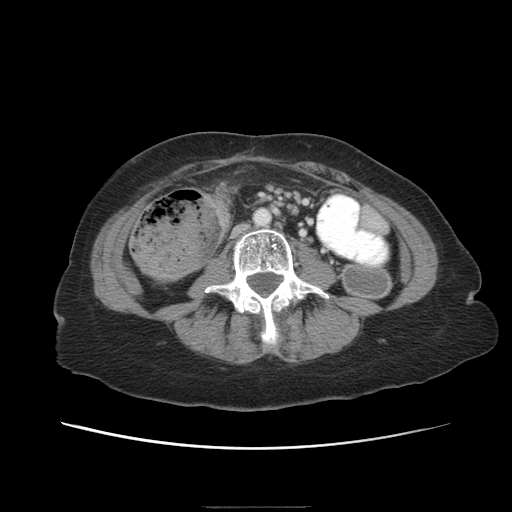
[im 53/84  soft-tissue]
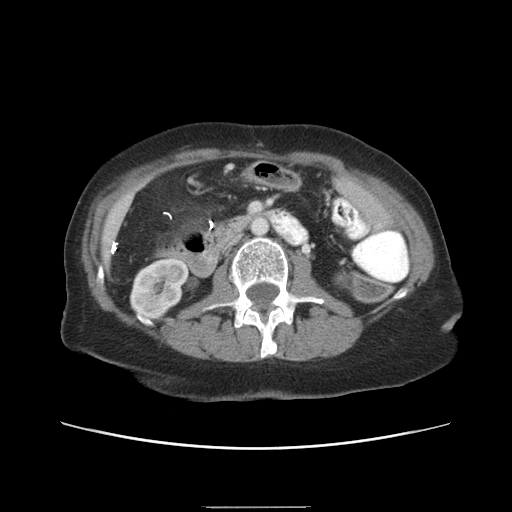
[im 53/84  lung]
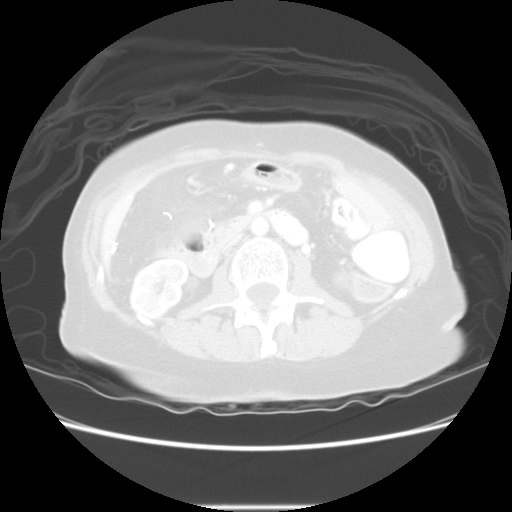
[im 61/84  soft-tissue]
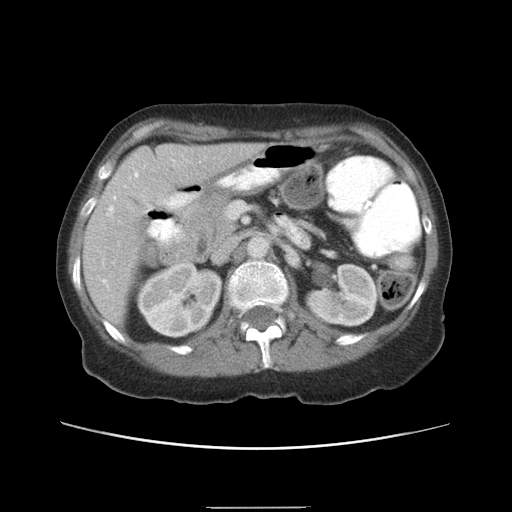
[im 61/84  lung]
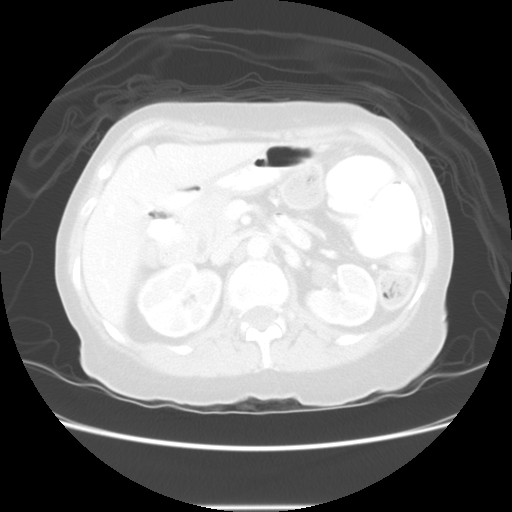
[im 68/84  soft-tissue]
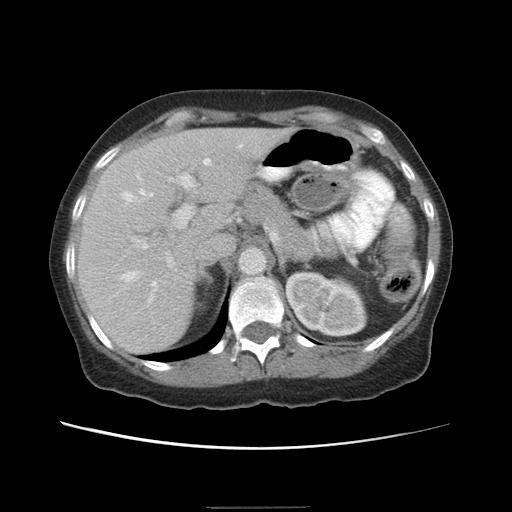
[im 68/84  lung]
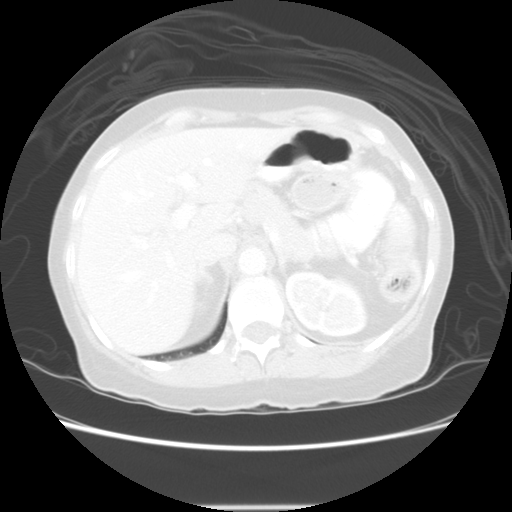
[im 68/84  bone]
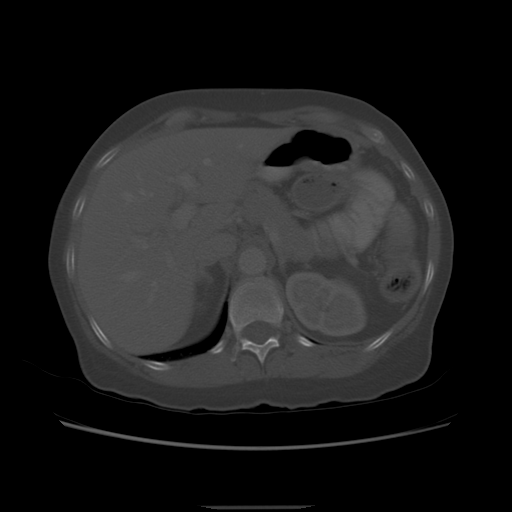
[im 76/84  soft-tissue]
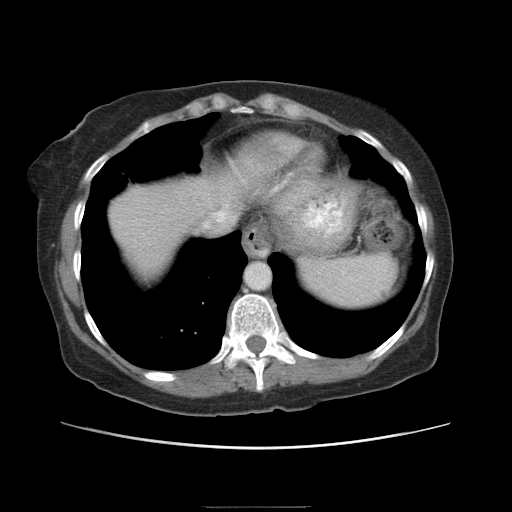
[im 76/84  lung]
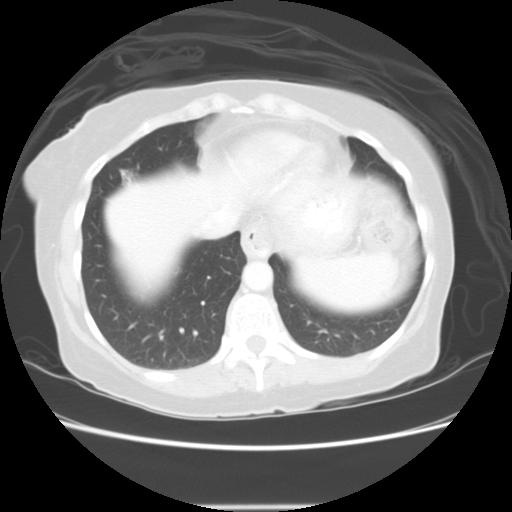

[Series 400: reformatted · sagittal · 0.83mm/px · 1 of 110 slices shown, 2 images]
[im 37/110  soft-tissue]
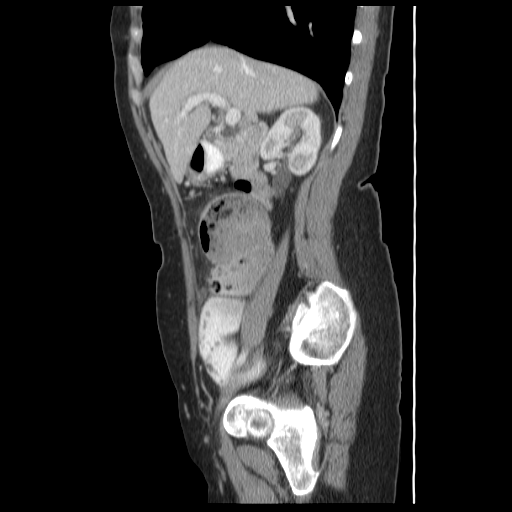
[im 37/110  bone]
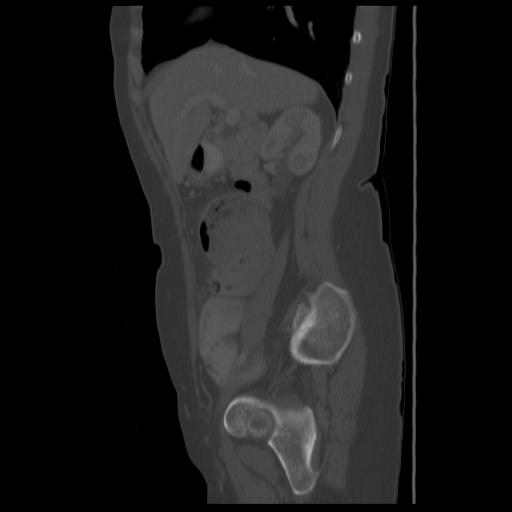

[11 of 36 positions shown; findings below may reference images not displayed]

FINDINGS: A small right hepatic cyst is identified but the
remainder the liver is unremarkable.
The spleen, pancreas, and adrenal glands are unremarkable.
Bilateral renal cortical atrophy is identified the kidneys are
otherwise unremarkable.
The patient is status post cholecystectomy.

There are dilated loops of small bowel noted to a focal area of
small bowel wall thickening and narrowing within the anterior right
abdomen (images 35-38).  Mild wall thickening of the bowel distal
to this focal area of narrowing down to the rectum is identified
but this bowel is not collapsed.

There is no evidence of pneumoperitoneum, free fluid, biliary
dilatation, or dilated aneurysm.
IMPRESSION: Focal small bowel wall thickening within the anterior right abdomen
causing partial bowel obstruction - compatible with Chron's
involvement. No evidence of pneumoperitoneum.

Mild apparent wall thickening of the remainder of the bowel distal
to this focal narrowing suggesting Crohn's involvement as well.

CT PELVIS
FINDINGS: There are dilated loops of small bowel noted.  A a focal
area of small bowel wall thickening and narrowing is identified
within the anterior right abdomen (images 35-38).  Mild wall
thickening of the bowel distal to this focal area of narrowing down
to the rectum is identified but this bowel is not collapsed.

No evidence of free fluid or enlarged lymph nodes identified. ]
IMPRESSION: Focal small bowel wall thickening within the anterior right abdomen
causing partial bowel obstruction - compatible with Chron's
involvement. No evidence of pneumoperitoneum.

Mild apparent wall thickening of the remainder of the bowel distal
to this focal narrowing suggesting Crohn's involvement as well.

## 2009-11-19 ENCOUNTER — Emergency Department (HOSPITAL_COMMUNITY): Admission: EM | Admit: 2009-11-19 | Discharge: 2009-11-19 | Payer: Self-pay | Admitting: Emergency Medicine

## 2009-11-26 IMAGING — CT CT PELVIS W/ CM
2 of 5 series · 17 of 46 positions shown, 19 images · IV contrast (80 ml omni 300)
Comparison: 08/16/2008

CT ABDOMEN

CLINICAL DATA: Abdominal pain

CT ABDOMEN AND PELVIS WITH CONTRAST
TECHNIQUE: Multidetector CT imaging of the abdomen and pelvis was
performed using the standard protocol following bolus
administration of intravenous contrast.
Contrast: 80 ml Xmnipaque-AGG

[Series 2: routine abdomen · axial · 0.70mm/px · z∈[-415,-55]mm · 14 of 82 slices shown, 16 images]
[im 5/82  soft-tissue]
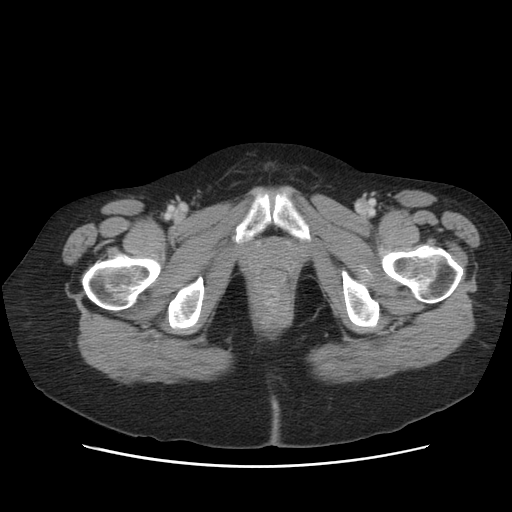
[im 5/82  bone]
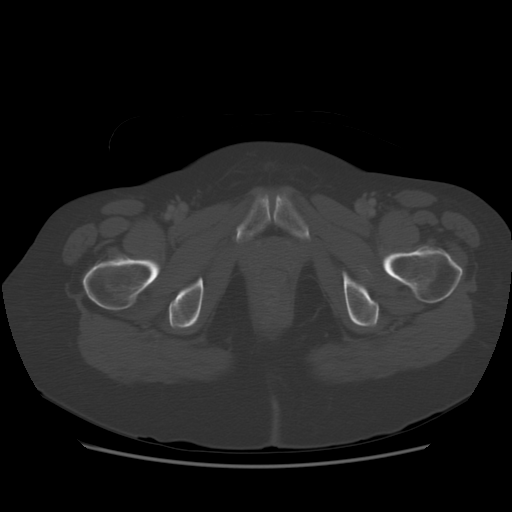
[im 9/82  soft-tissue]
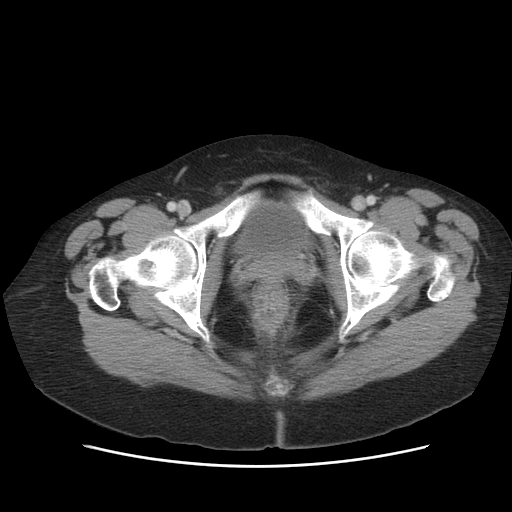
[im 18/82  soft-tissue]
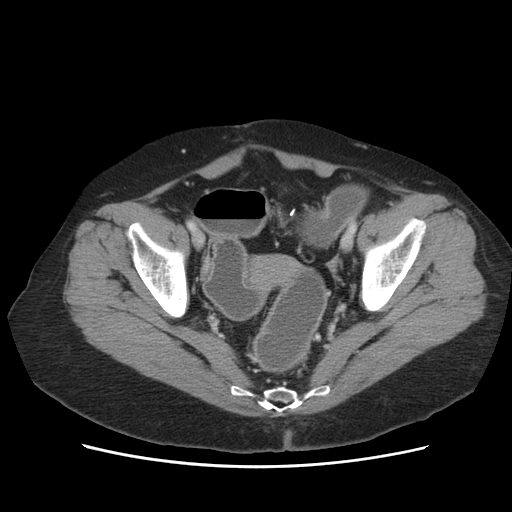
[im 22/82  soft-tissue]
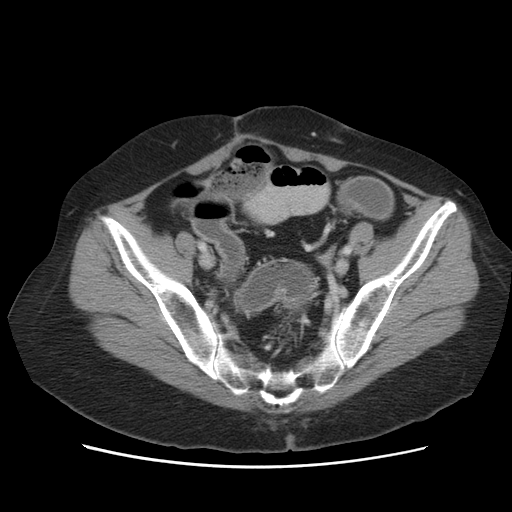
[im 26/82  soft-tissue]
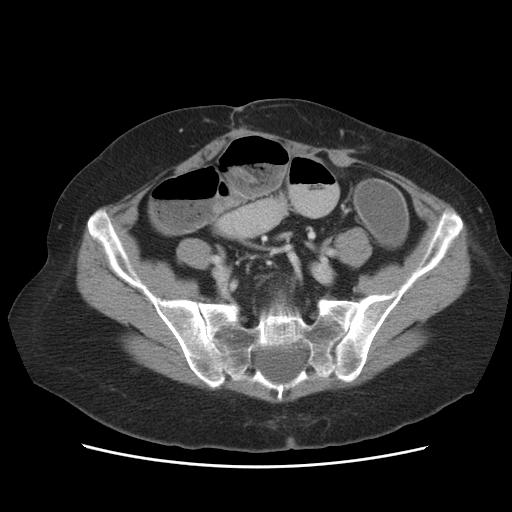
[im 35/82  soft-tissue]
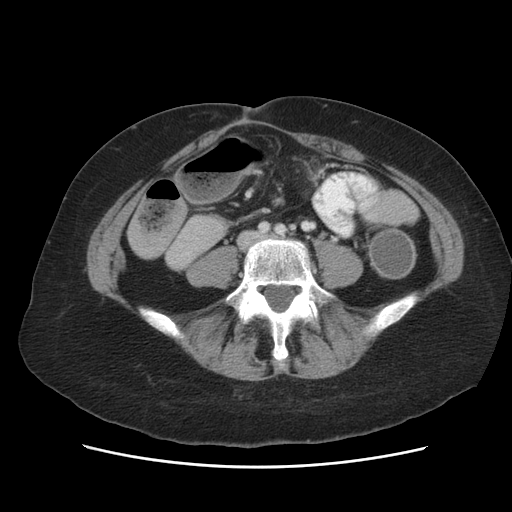
[im 39/82  soft-tissue]
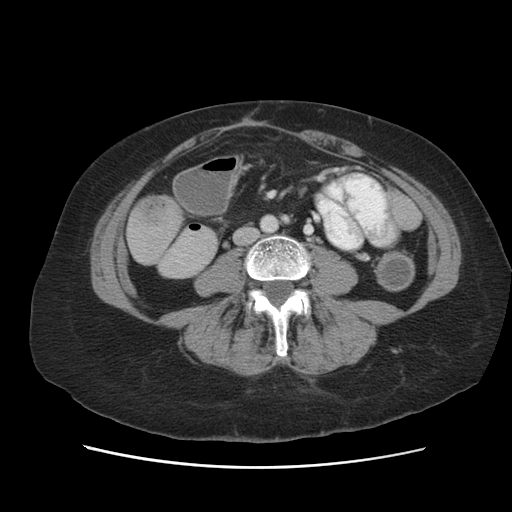
[im 43/82  soft-tissue]
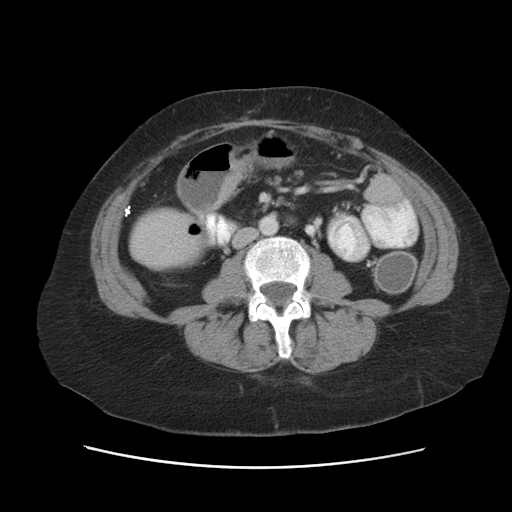
[im 47/82  soft-tissue]
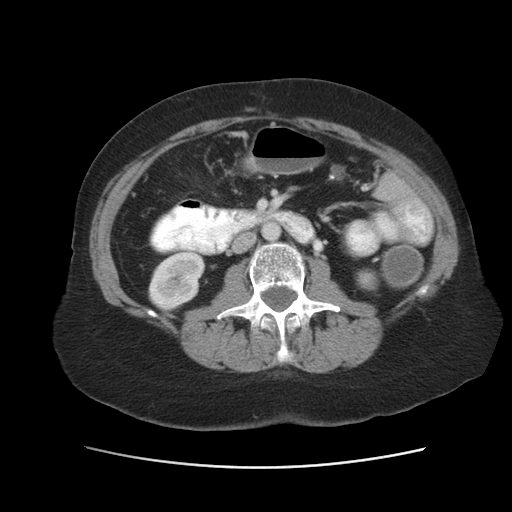
[im 47/82  bone]
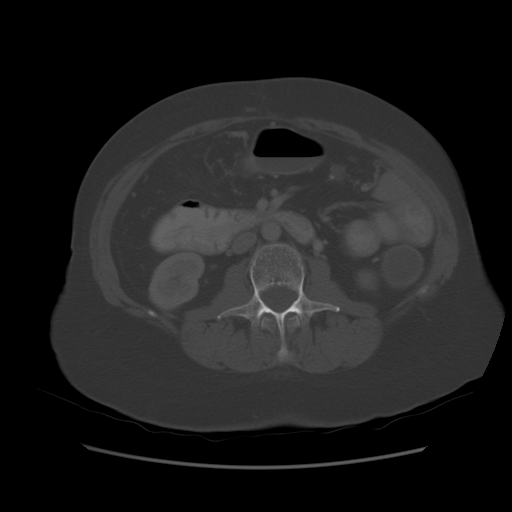
[im 56/82  soft-tissue]
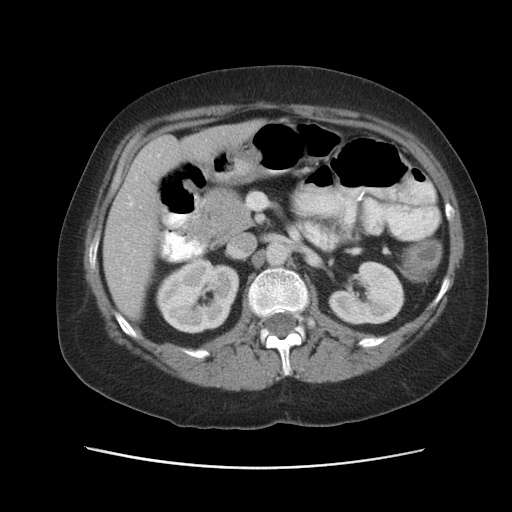
[im 60/82  soft-tissue]
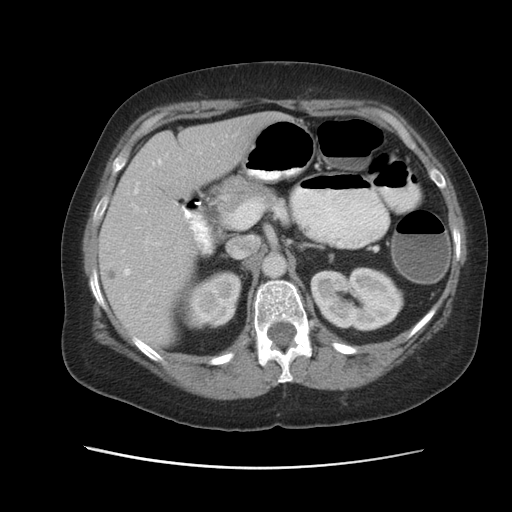
[im 64/82  soft-tissue]
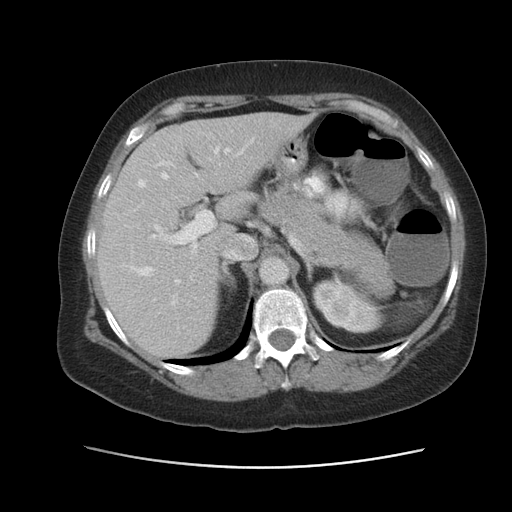
[im 73/82  soft-tissue]
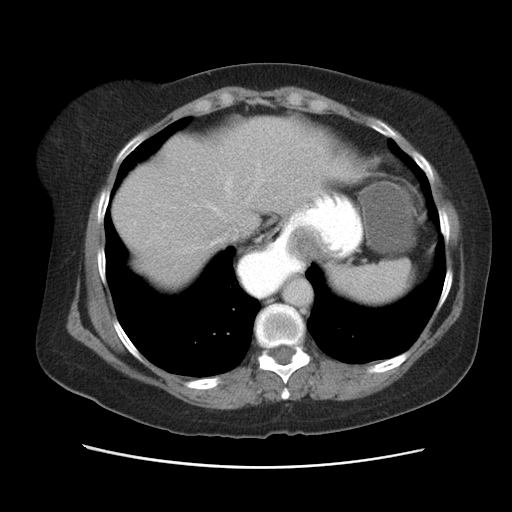
[im 77/82  soft-tissue]
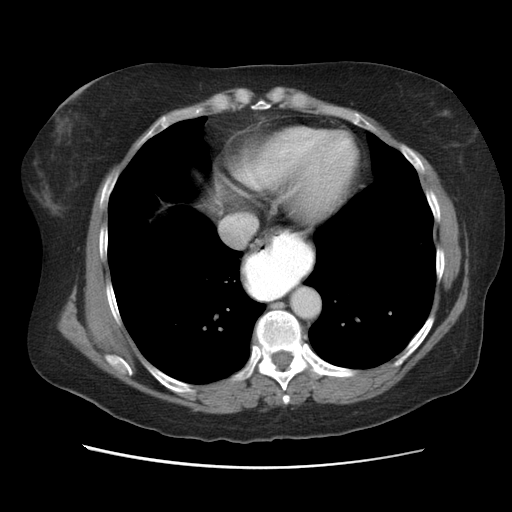

[Series 401: reformatted · coronal · 0.86mm/px · 3 of 88 slices shown]
[im 30/88  soft-tissue]
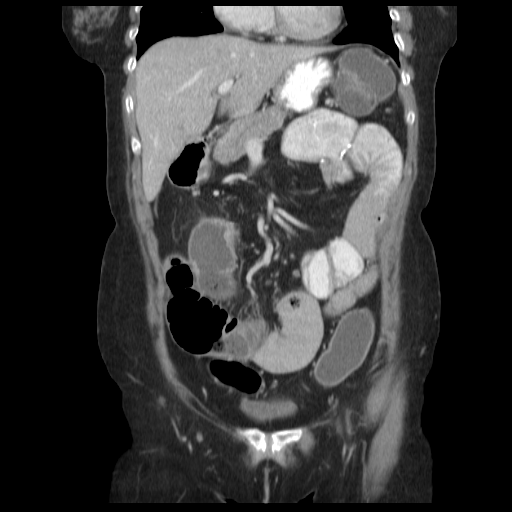
[im 39/88  soft-tissue]
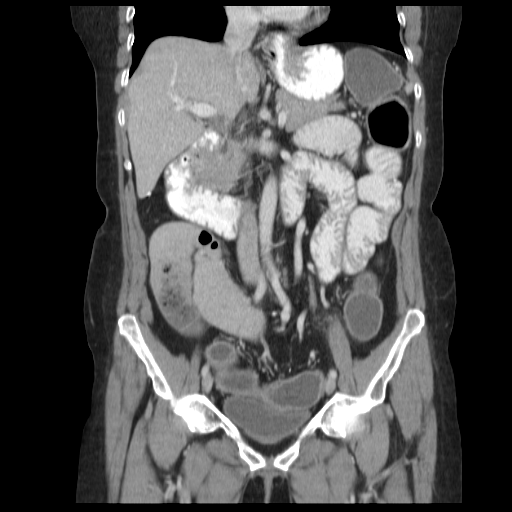
[im 49/88  soft-tissue]
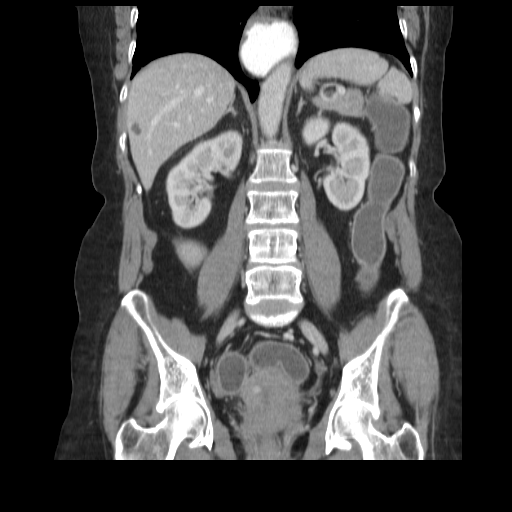

[17 of 46 positions shown; findings below may reference images not displayed]

FINDINGS: Scarring verse atelectasis at the lung bases is stable.
A large hiatal hernia is not significantly changed.  Status post
colectomy is redemonstrated.  On images 38 and 39, there is a focal
bowel wall thickening involving the loop of small bowel in the
anterior abdomen.  Dilated small bowel loops are present both
proximal and distal to this area.  No pneumatosis.  No extraluminal
gas.  No free fluid in the abdomen.

A tiny hypodensity in the liver is stable.  Kidneys, adrenal
glands, pancreas, and spleen are within normal limits.
IMPRESSION: There is focal bowel wall thickening involving a loop of small
bowel in the anterior abdomen.  Dilated small bowel loops are
present.  Considerations include both neoplastic an inflammatory
etiology.  Dilated small bowel loops suggest  partial small bowel
obstruction.

CT PELVIS
FINDINGS: The uterus and adnexa are within normal limits.  There
is no free fluid in the pelvis.  The bladder is unremarkable.
No acute intrapelvic pathology.
IMPRESSION:

## 2009-11-26 IMAGING — CR DG ABDOMEN 2V
2 series · 2 of 2 positions shown · non-contrast
Comparison: 08/16/2008

CLINICAL DATA: Crohn's disease.  Abdominal pain.

ABDOMEN - 2 VIEW

[w abdomen upright]
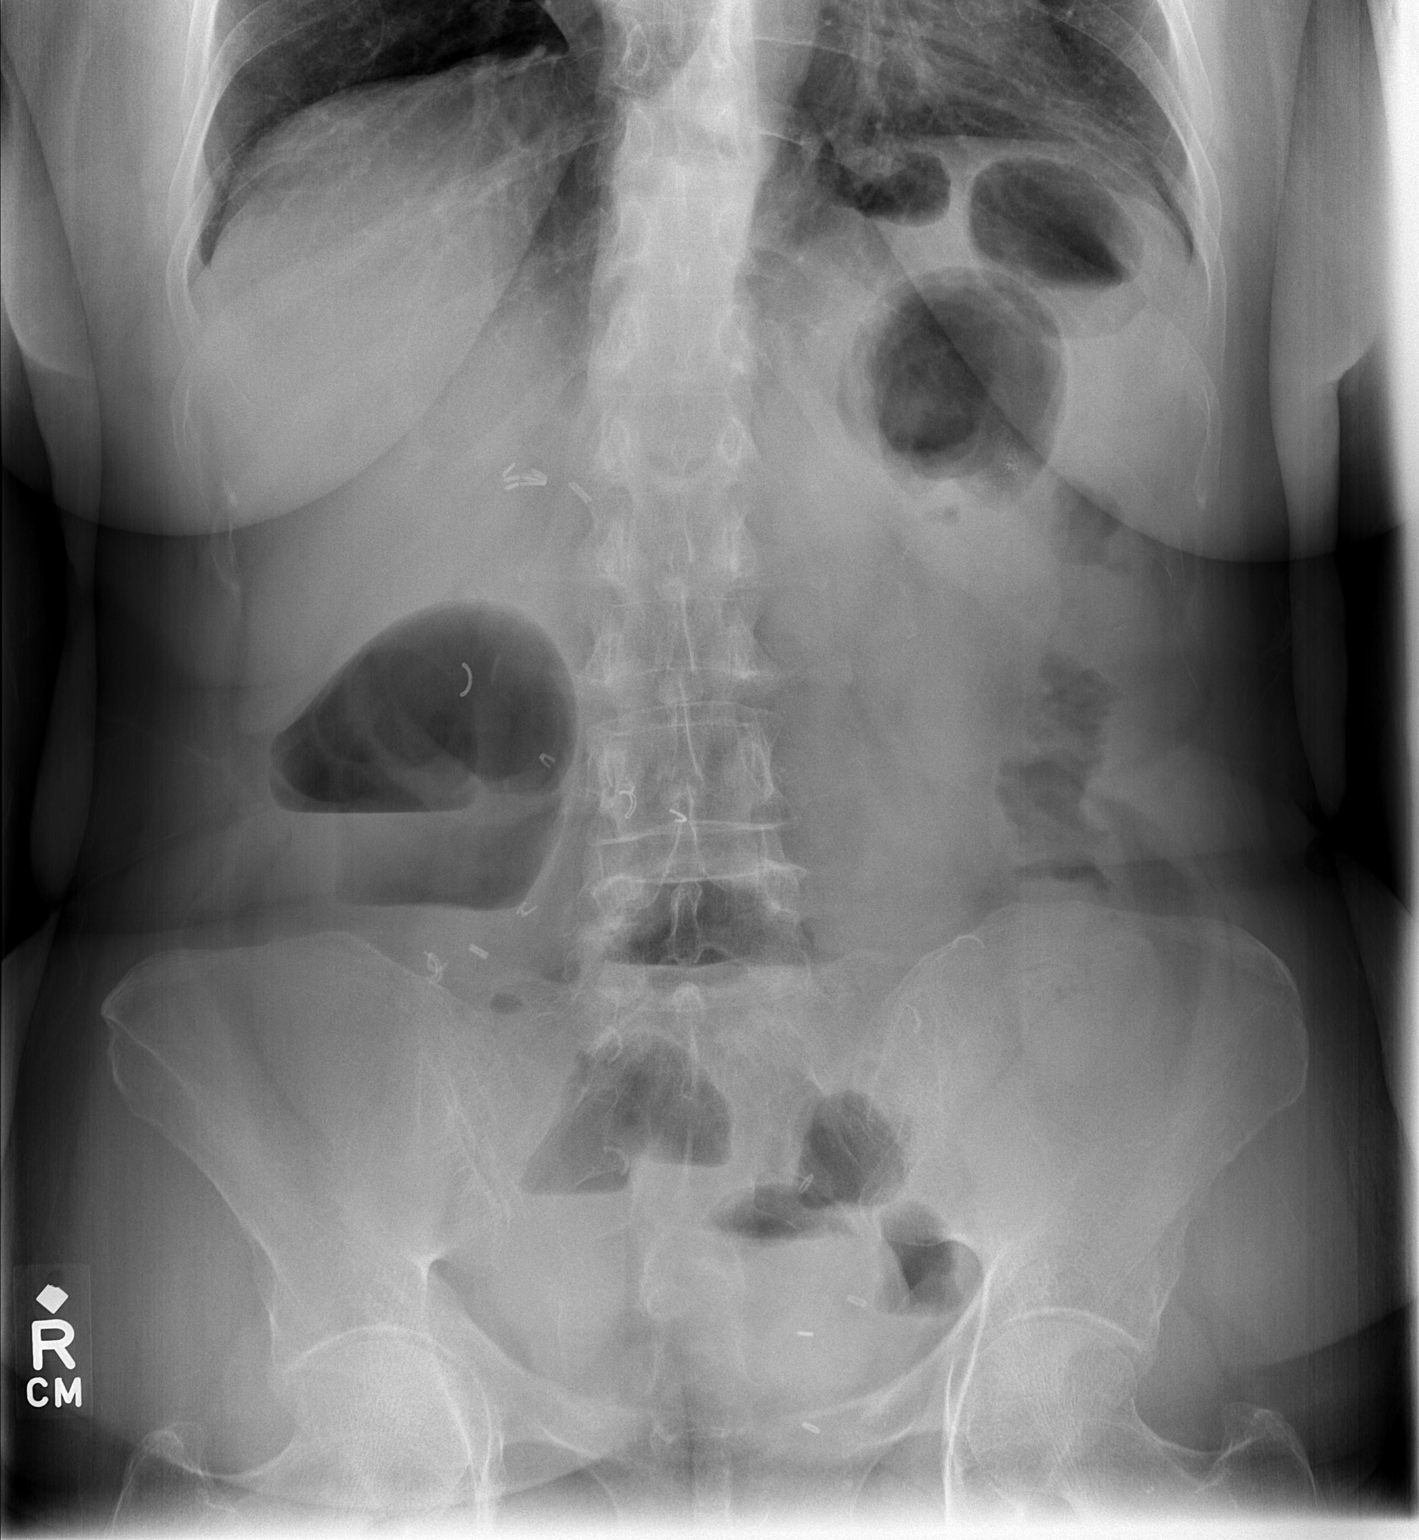

[t abdomen supine]
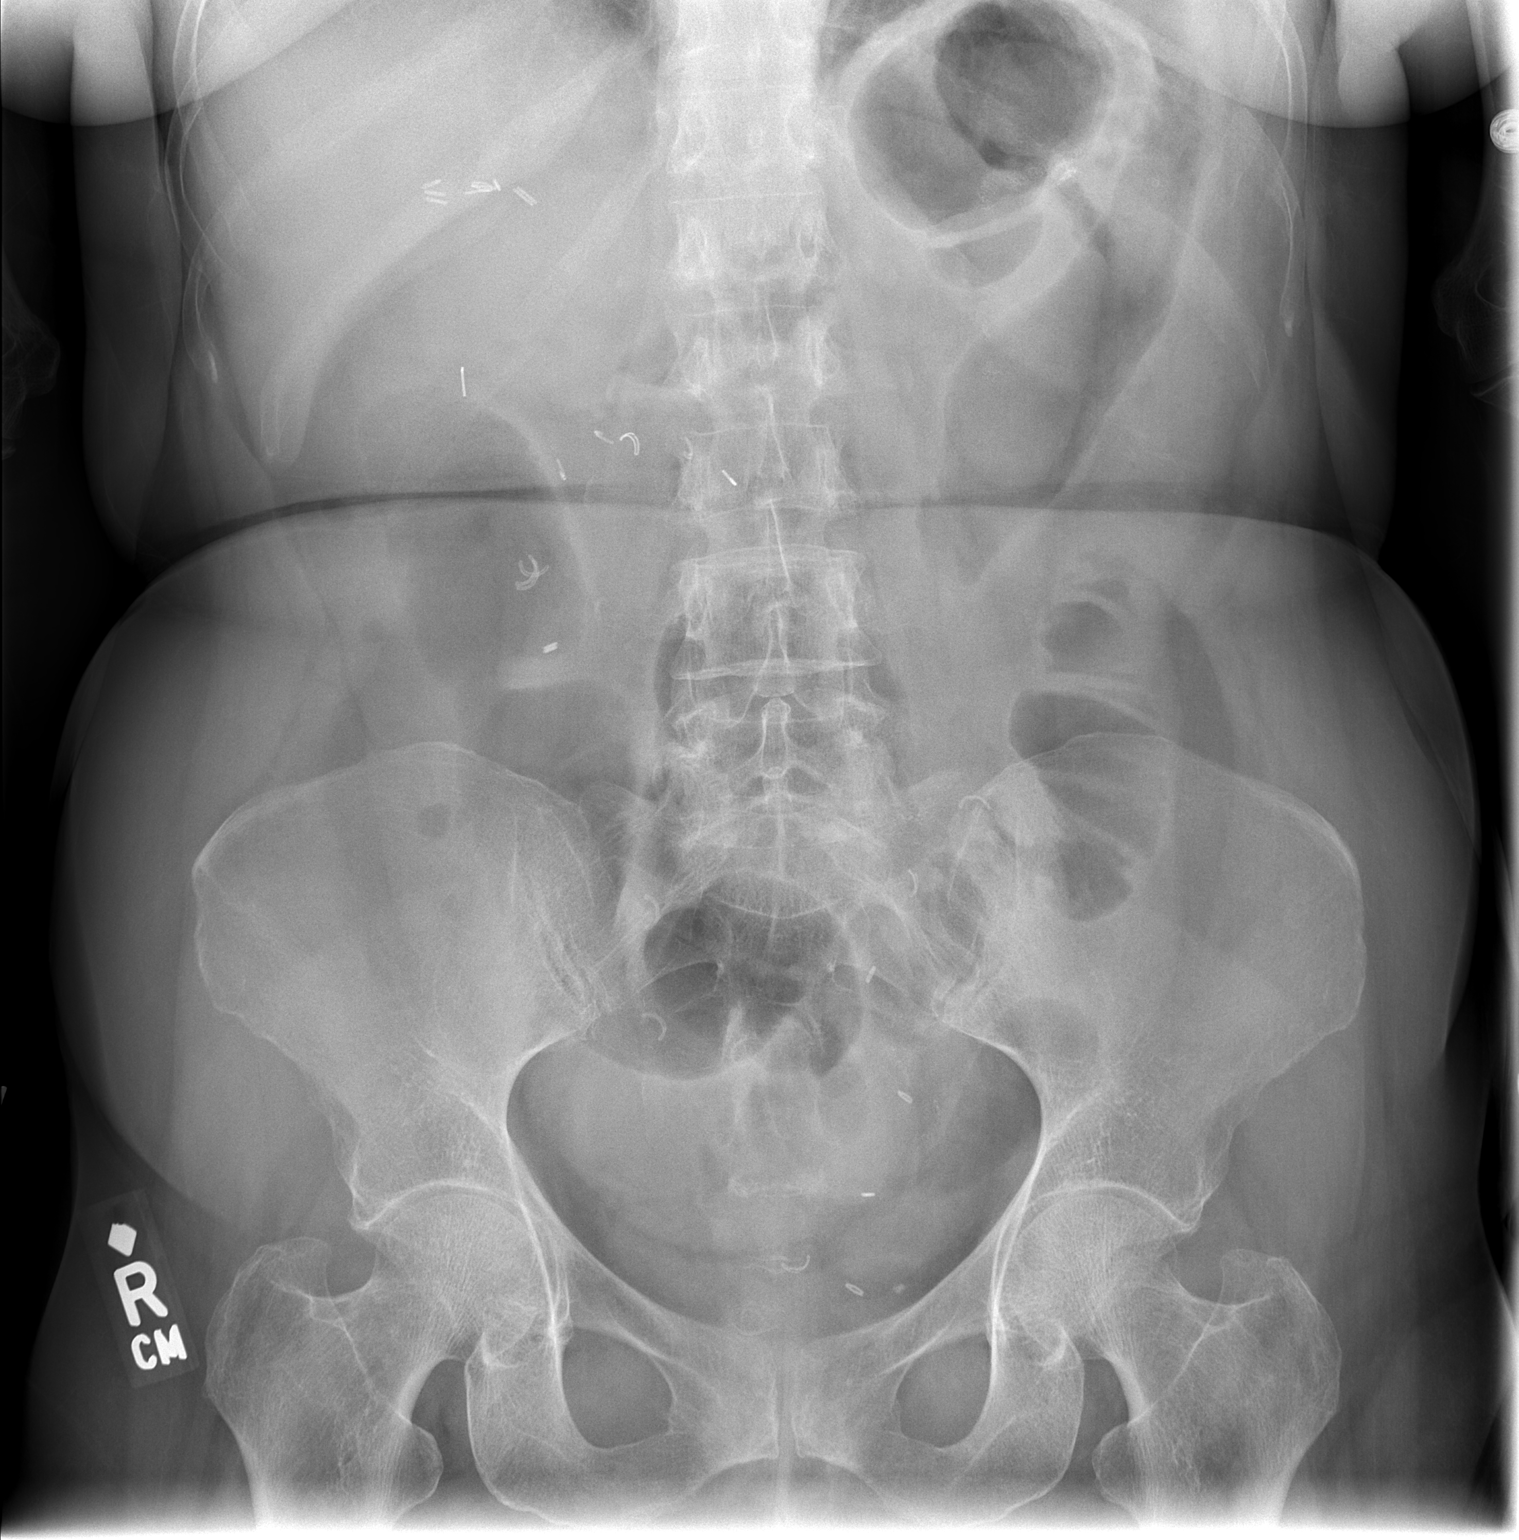

[2 of 2 positions shown; findings below may reference images not displayed]

FINDINGS: Dilated small bowel loops and air-fluid levels are
present in the right lower quadrant.  There is no free
intraperitoneal gas.  Postoperative changes are noted.  Bones are
demineralized.  Soft tissues are within normal limits.
IMPRESSION: Partial or early small bowel obstruction and postoperative changes.
No free intraperitoneal gas to suggest perforation.

## 2009-11-27 IMAGING — CR DG ABDOMEN 2V
2 series · 2 of 2 positions shown · non-contrast
Comparison: 08/15/2008

CLINICAL DATA: Abdominal pain

ABDOMEN - 2 VIEW

[w abdomen upright]
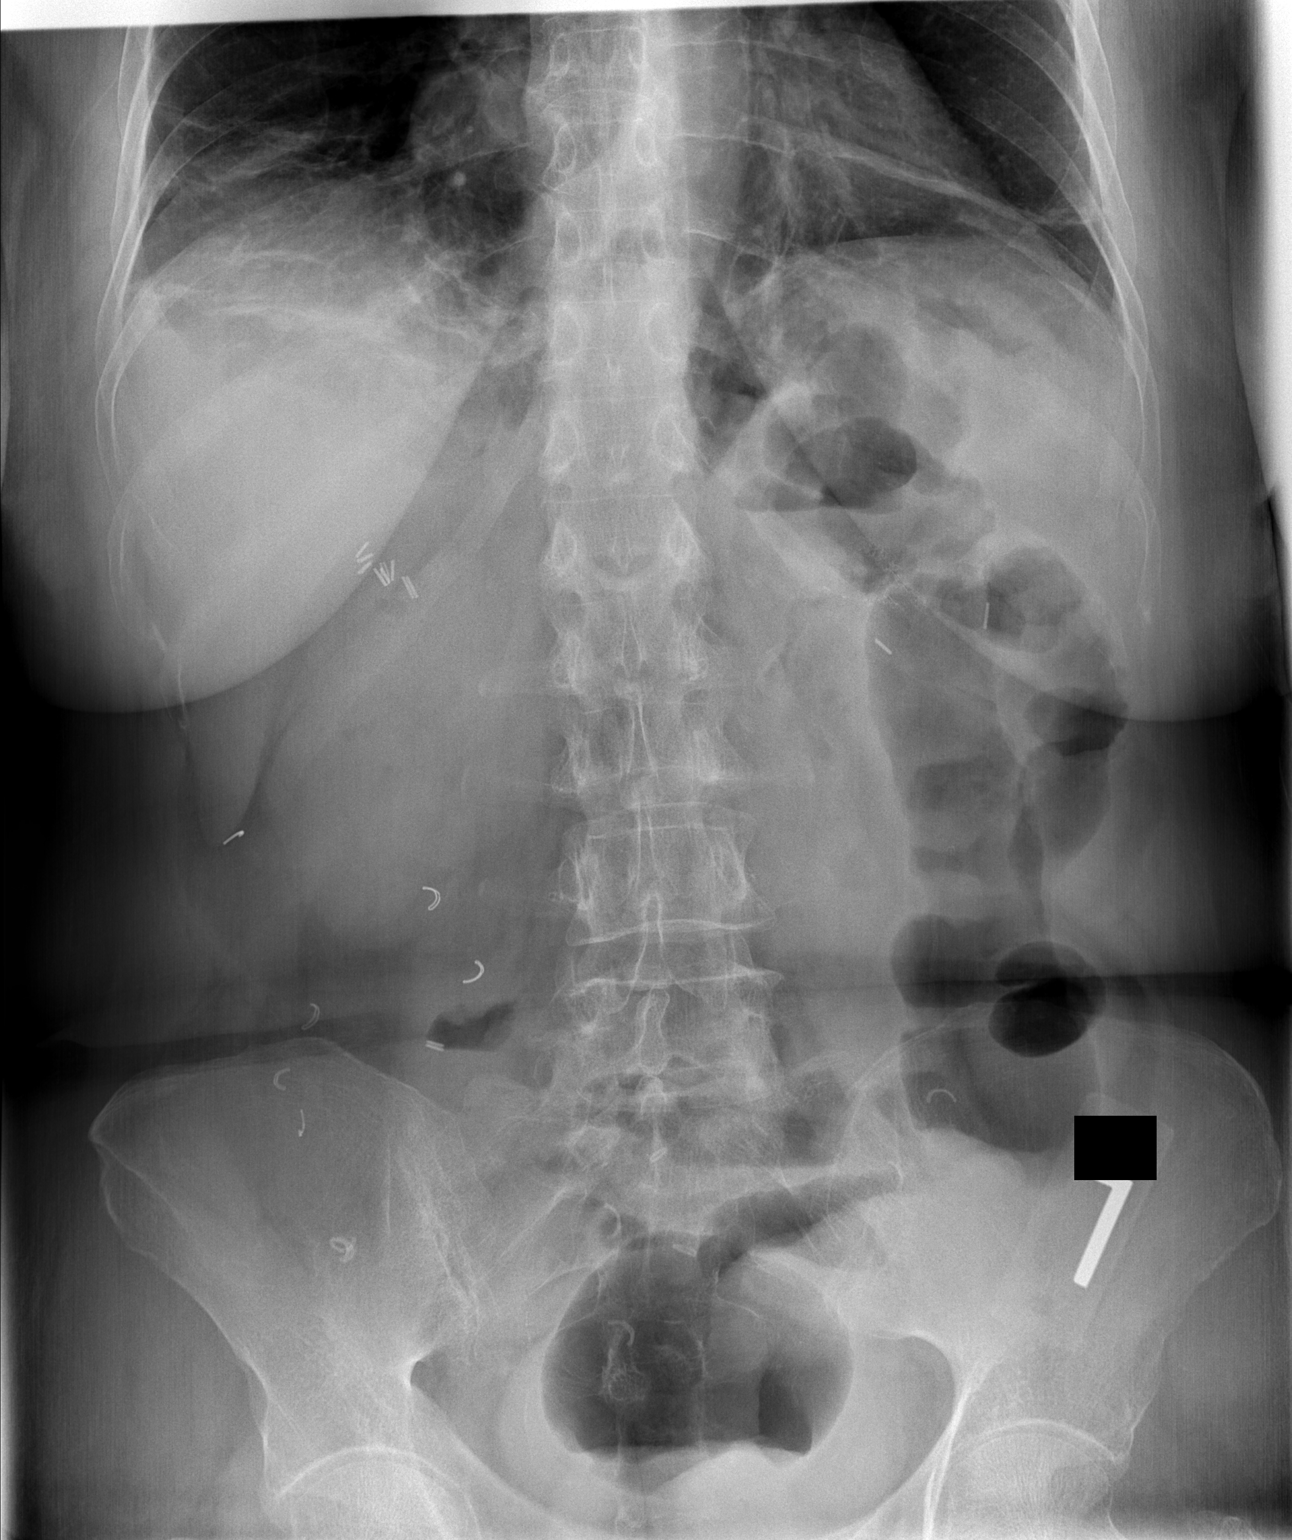

[t abdomen supine]
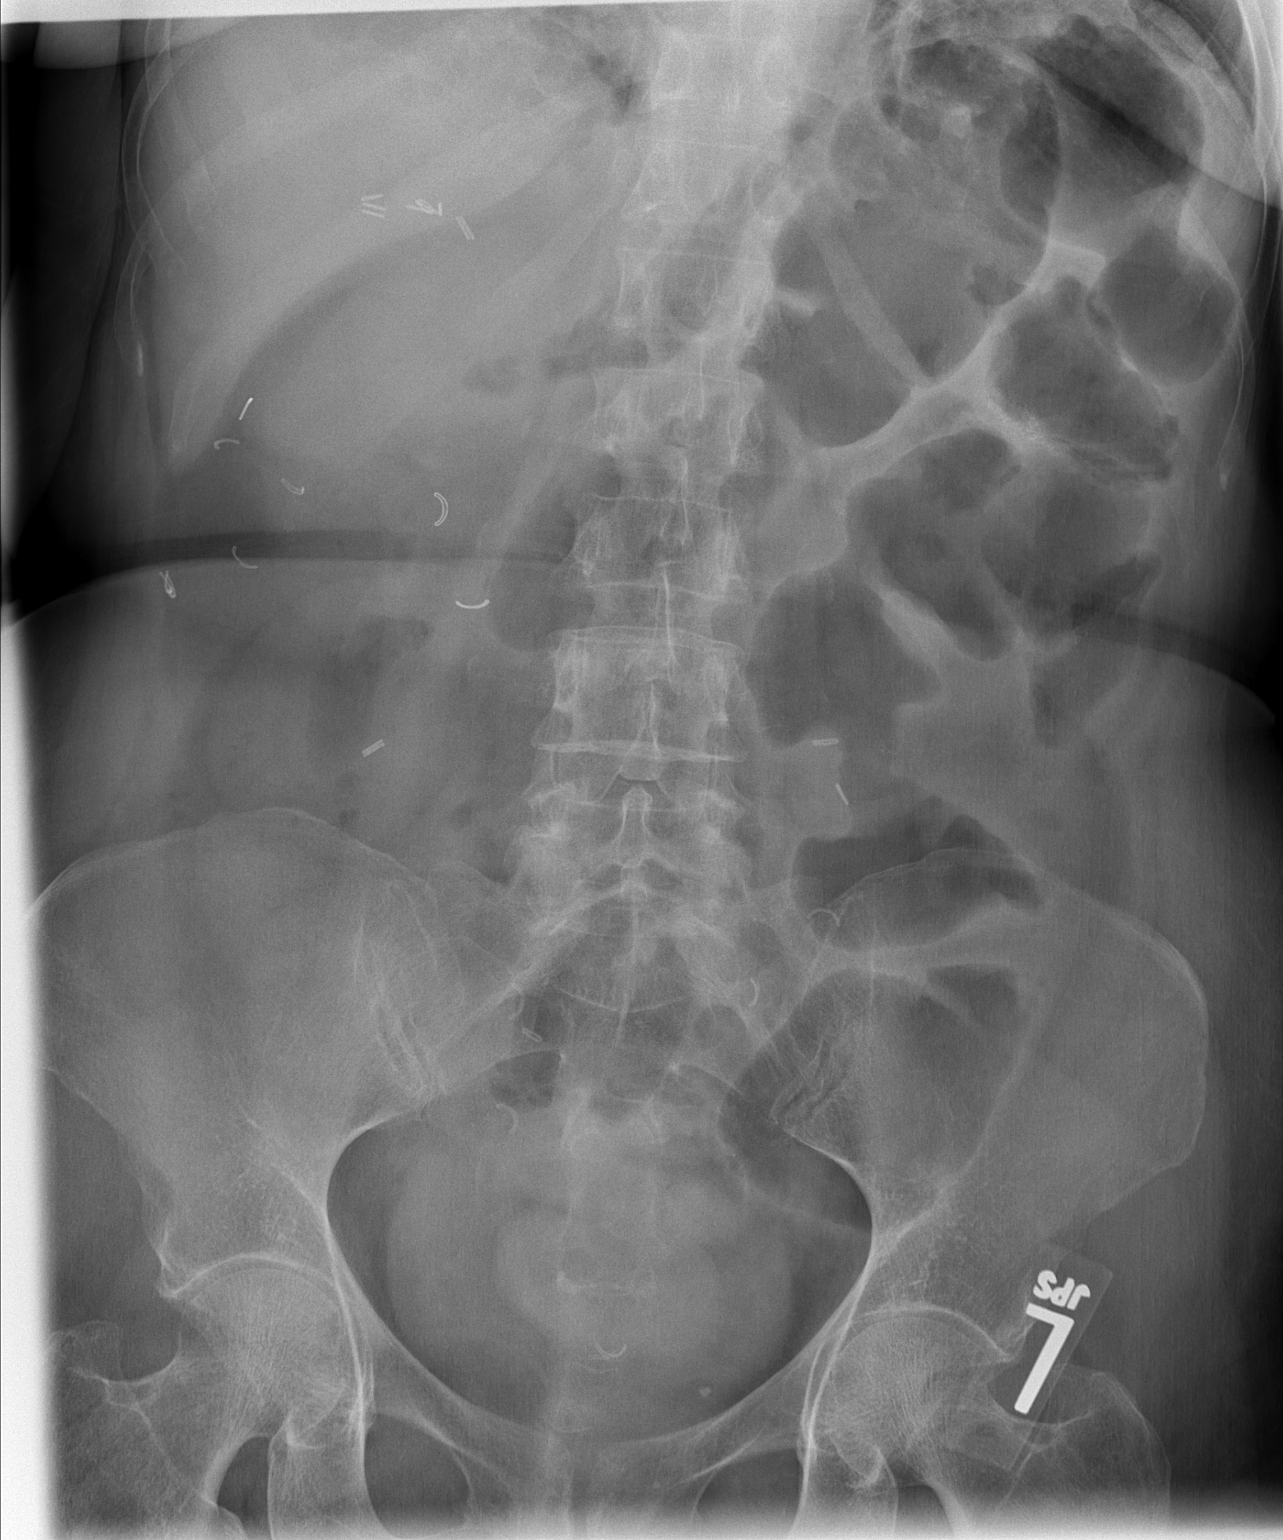

[2 of 2 positions shown; findings below may reference images not displayed]

FINDINGS: No free air or acute/specific abnormality of the bowel
gas pattern.  There is mild dilatation of the left colon without
evidence for obstruction.  No wall thickening or pneumatosis.

Psoas margins intact.  No pathological calcifications.  Numerous
surgical clips are present.  No osseous lesions.
IMPRESSION: Nonspecific dilatation of the left colon - no acute or specific
findings.

## 2009-12-10 IMAGING — CR DG ABDOMEN ACUTE W/ 1V CHEST
3 series · 3 of 3 positions shown · non-contrast
Comparison: Abdominal radiographs 11/13/2008, three-way series
08/15/2008

CLINICAL DATA: Abdominal pain

ACUTE ABDOMEN SERIES (ABDOMEN 2 VIEW & CHEST 1 VIEW)

[w chest pa]
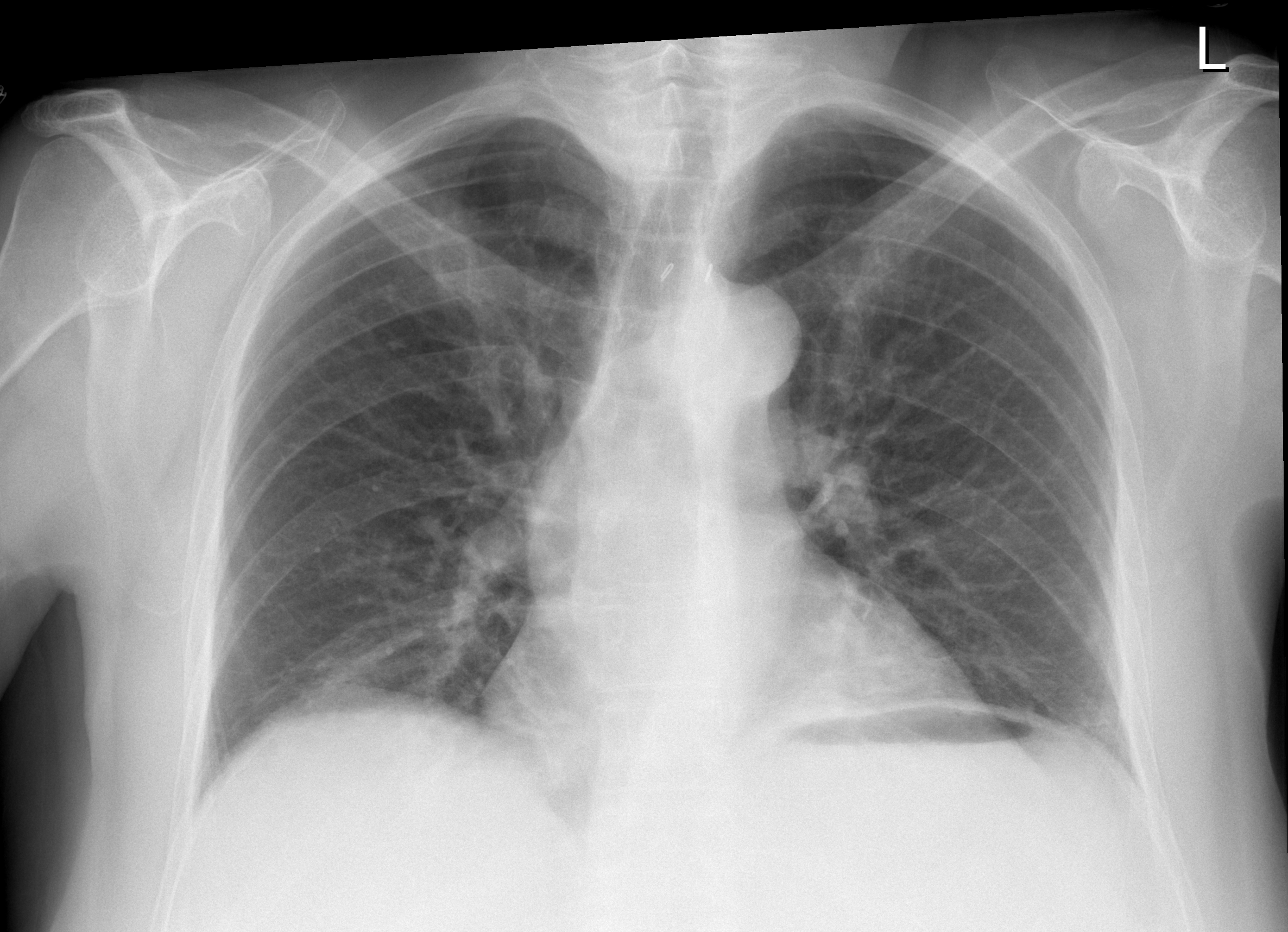

[w abdomen upright]
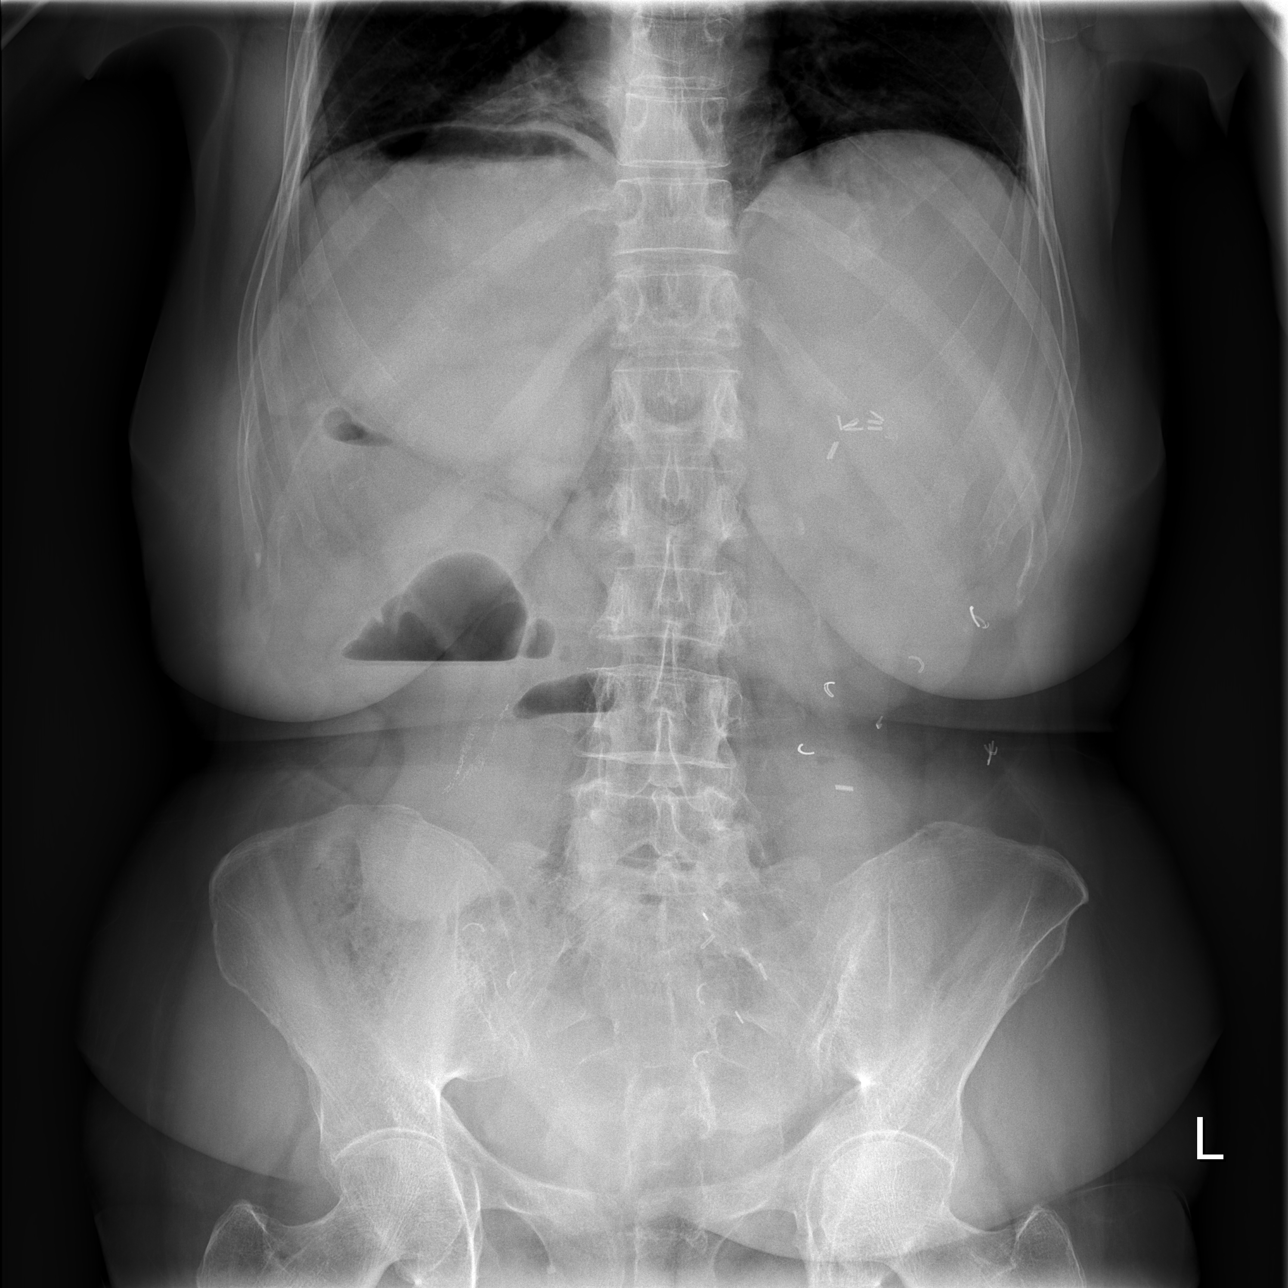

[t abdomen supine]
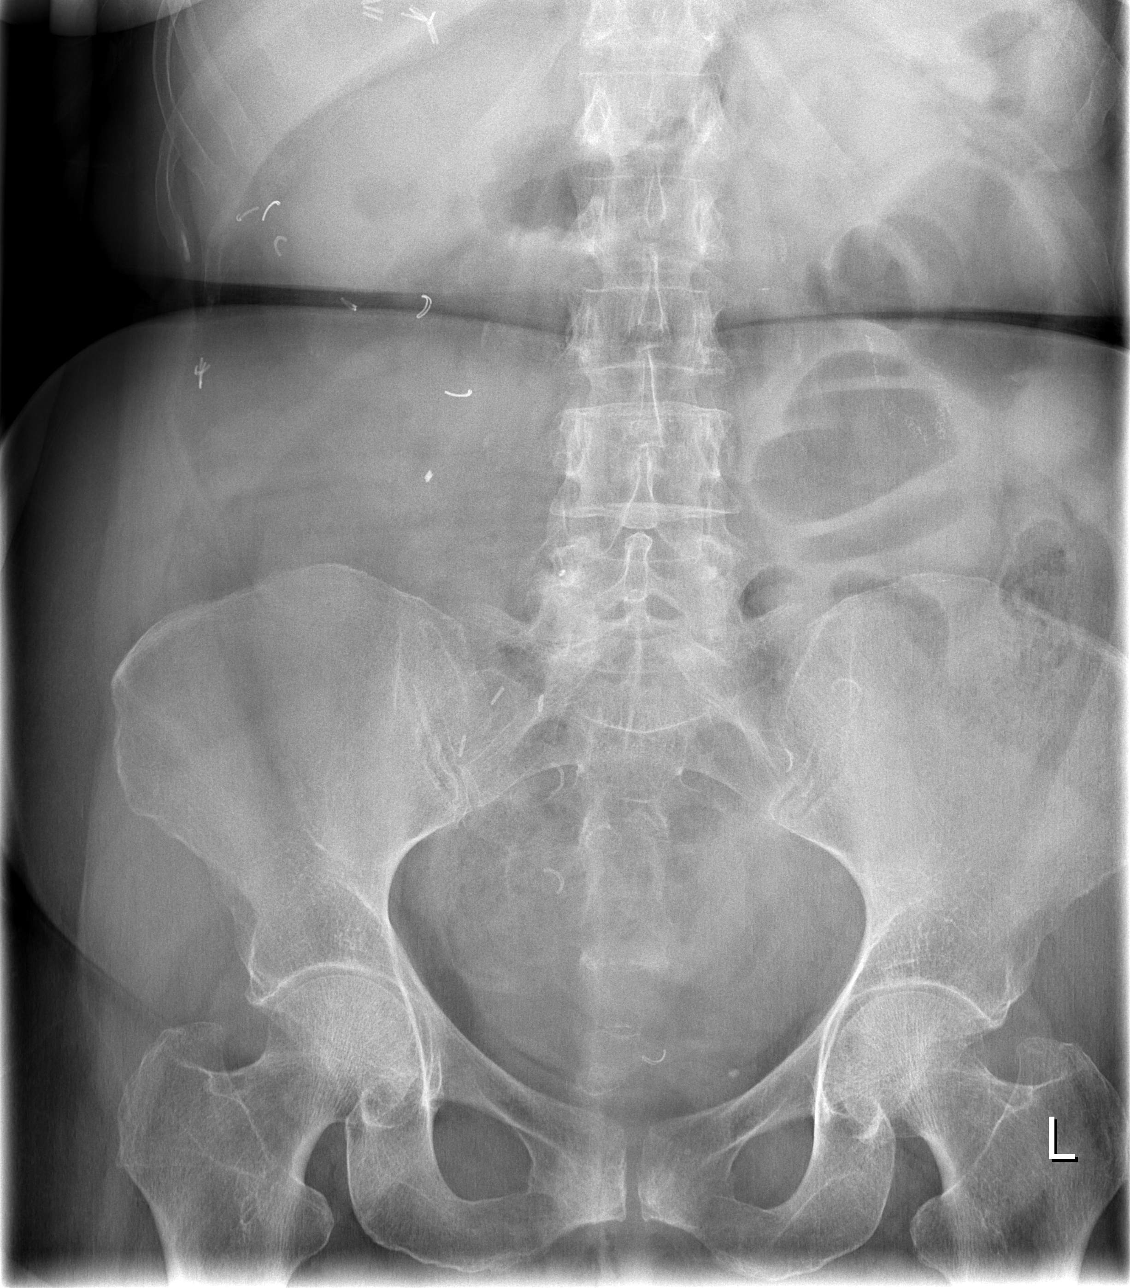

[3 of 3 positions shown; findings below may reference images not displayed]

FINDINGS: Lung volumes are low with bibasilar atelectasis.  No free
air beneath the diaphragms.  Heart size is on the upper limits of
normal accounting for technique.

Left upper quadrant suture lines are noted.  There is stable mild
prominence of several loops of colon but no gaseous small bowel
distention is seen to suggest small bowel obstruction.  No abnormal
air-fluid level identified.  No calcific opacity is seen.
IMPRESSION: Nonobstructive bowel gas pattern.

Low lung volumes with bibasilar atelectasis.

## 2009-12-17 ENCOUNTER — Inpatient Hospital Stay (HOSPITAL_COMMUNITY): Admission: EM | Admit: 2009-12-17 | Discharge: 2009-12-24 | Payer: Self-pay | Admitting: Emergency Medicine

## 2010-02-01 ENCOUNTER — Inpatient Hospital Stay (HOSPITAL_COMMUNITY): Admission: EM | Admit: 2010-02-01 | Discharge: 2010-02-08 | Payer: Self-pay | Admitting: Emergency Medicine

## 2010-02-07 ENCOUNTER — Ambulatory Visit: Payer: Self-pay | Admitting: Gastroenterology

## 2010-02-25 IMAGING — CR DG ABDOMEN ACUTE W/ 1V CHEST
3 series · 3 of 3 positions shown · non-contrast
Comparison: None

CLINICAL DATA: Abdominal pain, vomiting and diarrhea.

ACUTE ABDOMEN SERIES (ABDOMEN 2 VIEW & CHEST 1 VIEW)

[w chest pa]
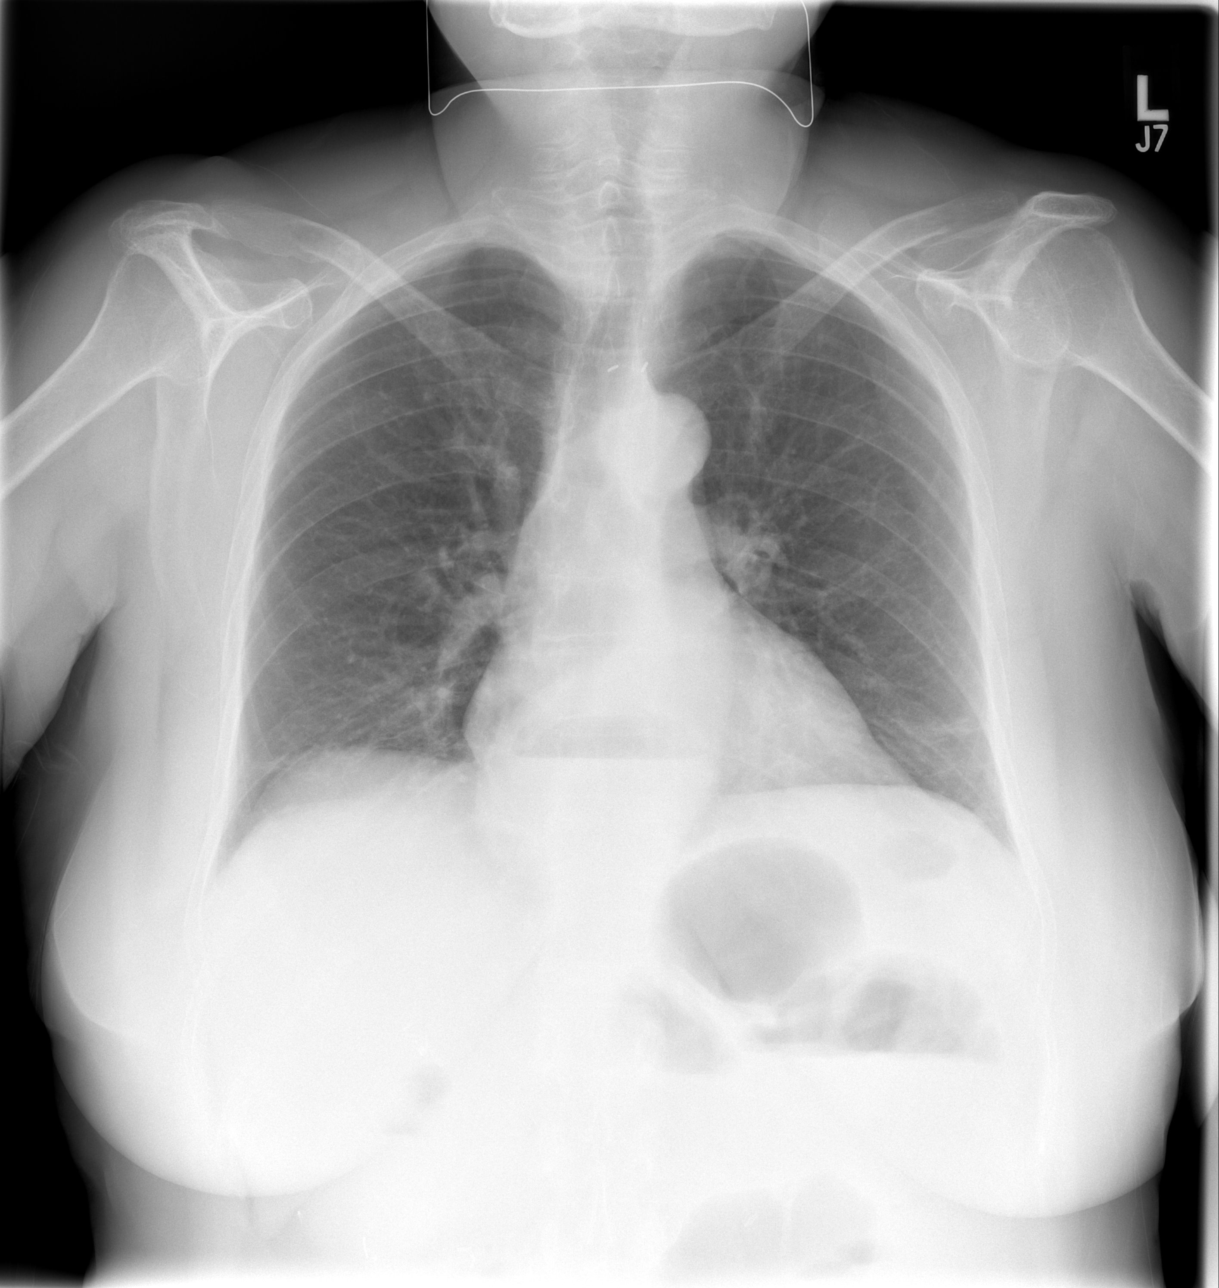

[w abdomen upright]
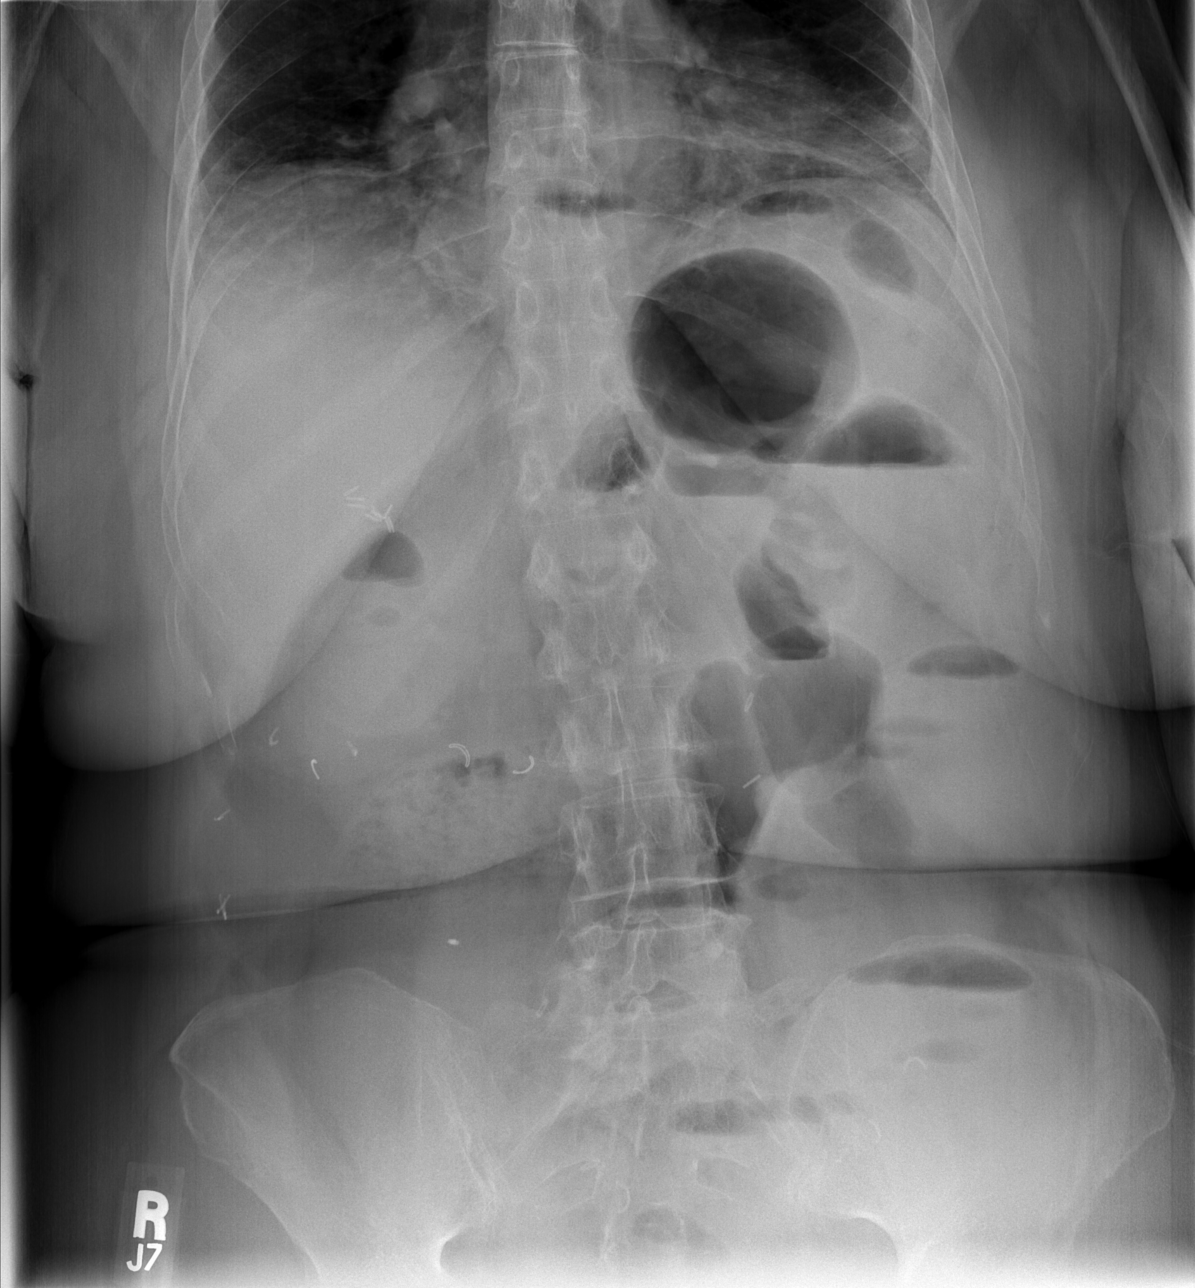

[t abdomen supine]
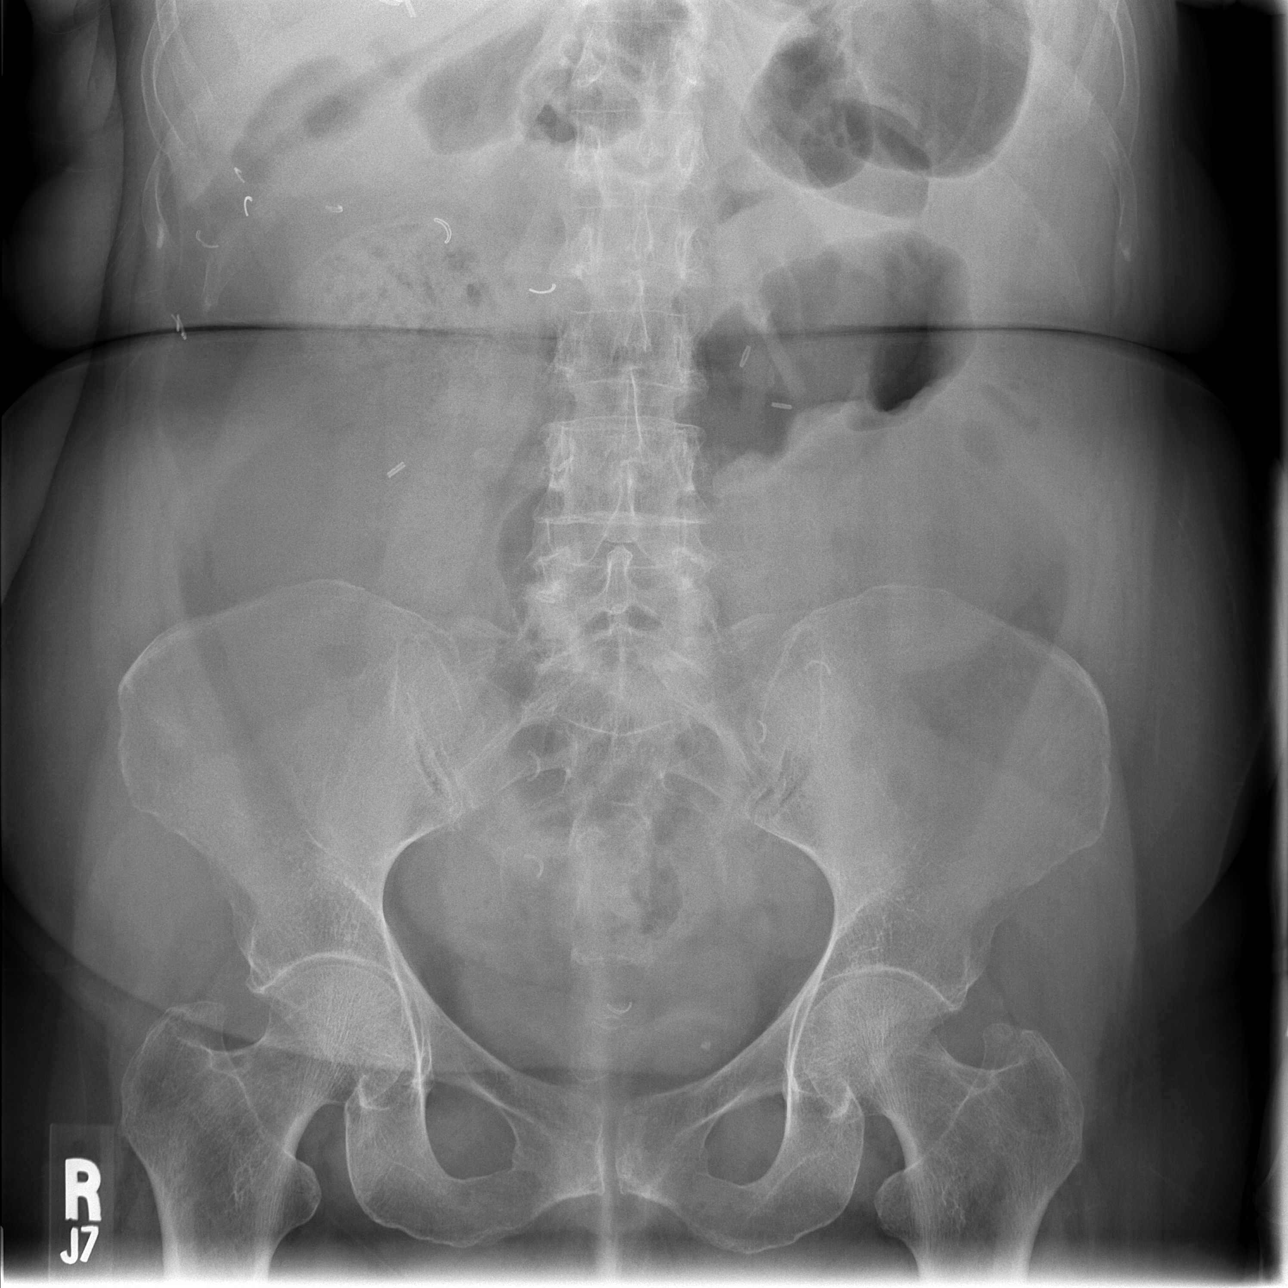

[3 of 3 positions shown; findings below may reference images not displayed]

FINDINGS: The upright chest x-ray demonstrates minimal streaky
bibasilar atelectasis.  Heart size is normal.

Two views of the abdomen demonstrate extensive surgical changes.
There is a moderate sized hiatal hernia.  There are dilated small
bowel loops with air-fluid levels consistent with a small bowel
obstruction.  No free air.
IMPRESSION: 1.  Streaky bibasilar atelectasis.
2.  Moderate sized hiatal hernia.
3.  Small bowel obstruction bowel gas pattern.

## 2010-02-25 IMAGING — CT CT PELVIS W/ CM
2 of 5 series · 13 of 32 positions shown, 18 images · IV contrast (water- bmi proto & 100 ML OMNI 300)
Comparison: 11/12/2008

CT ABDOMEN

CLINICAL DATA: Abdominal pain, vomiting and diarrhea.

CT ABDOMEN AND PELVIS WITH CONTRAST
TECHNIQUE: Multidetector CT imaging of the abdomen and pelvis was
performed using the standard protocol following bolus
administration of intravenous contrast.
Contrast: 100 ml Dmnipaque-1MM axial

[Series 2: routine abdomen · axial · 0.75mm/px · z∈[-395,-70]mm · 7 of 87 slices shown, 12 images]
[im 11/87  soft-tissue]
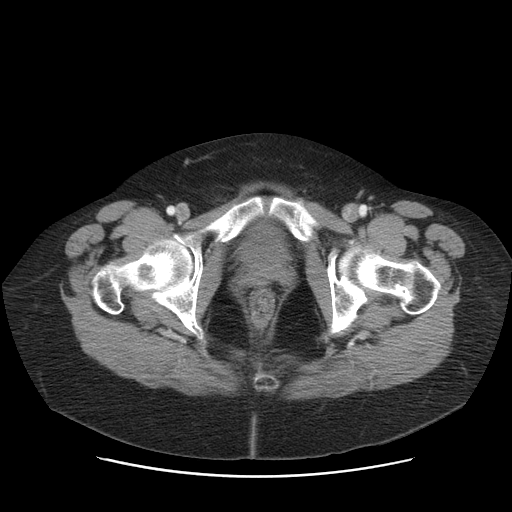
[im 11/87  bone]
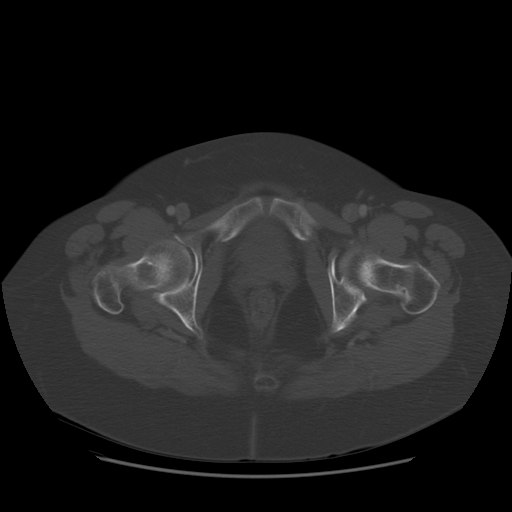
[im 22/87  soft-tissue]
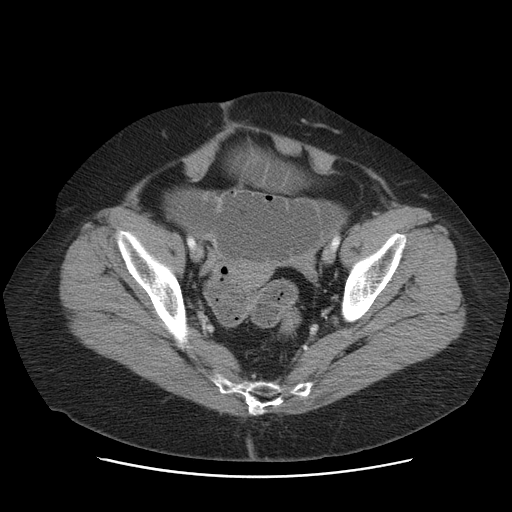
[im 33/87  soft-tissue]
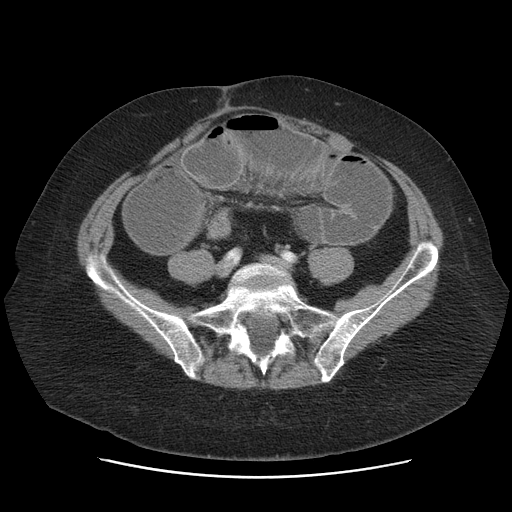
[im 44/87  soft-tissue]
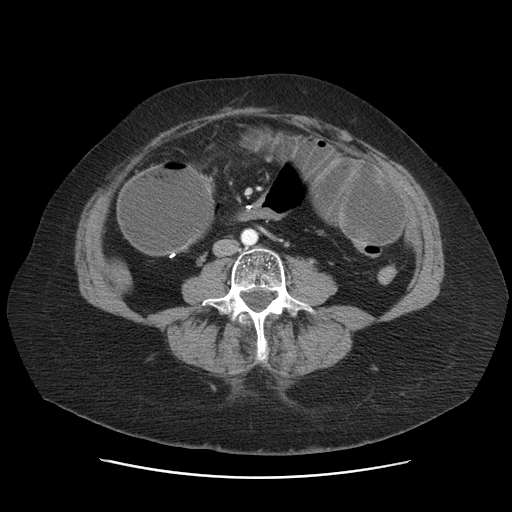
[im 44/87  lung]
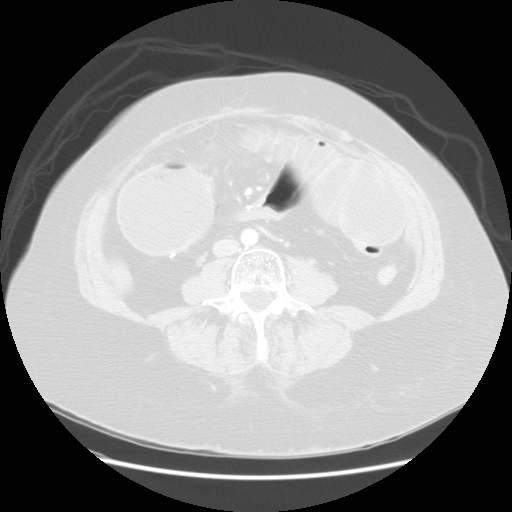
[im 54/87  soft-tissue]
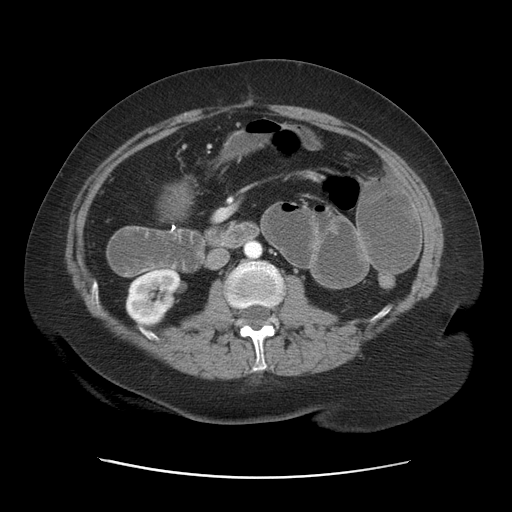
[im 54/87  lung]
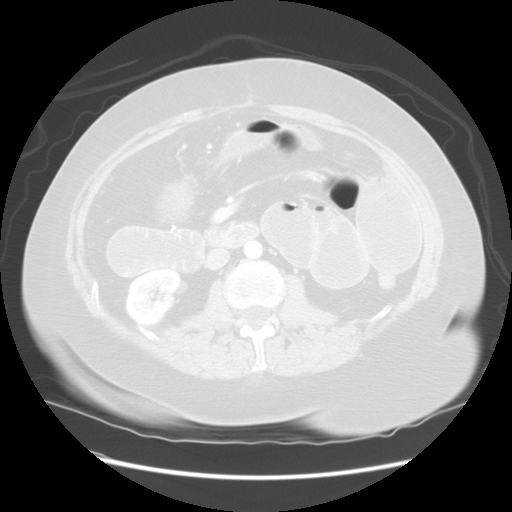
[im 65/87  soft-tissue]
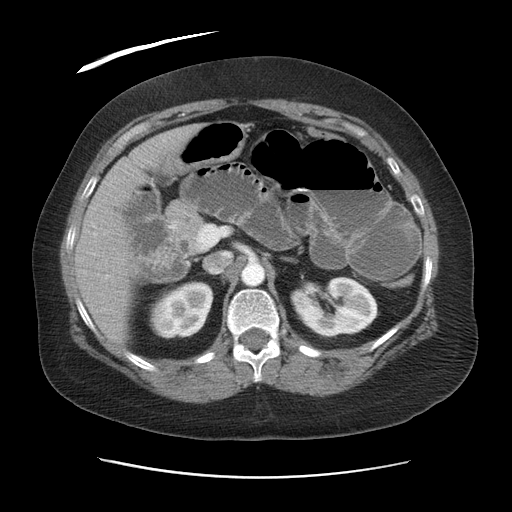
[im 65/87  lung]
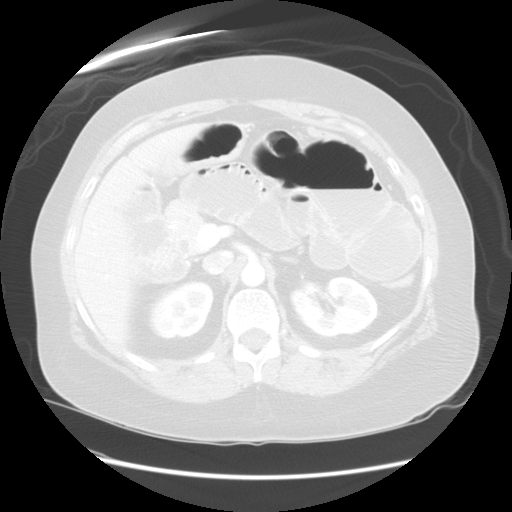
[im 76/87  soft-tissue]
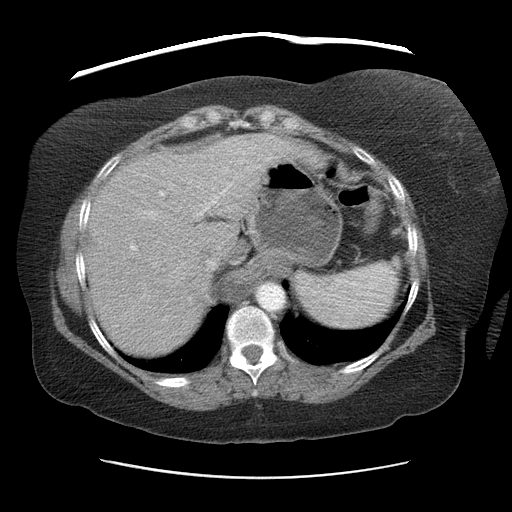
[im 76/87  lung]
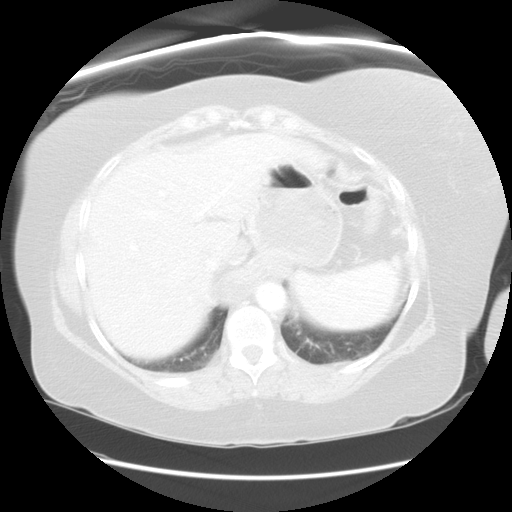

[Series 400: reformatted · sagittal · 0.88mm/px · 6 of 101 slices shown]
[im 12/101  soft-tissue]
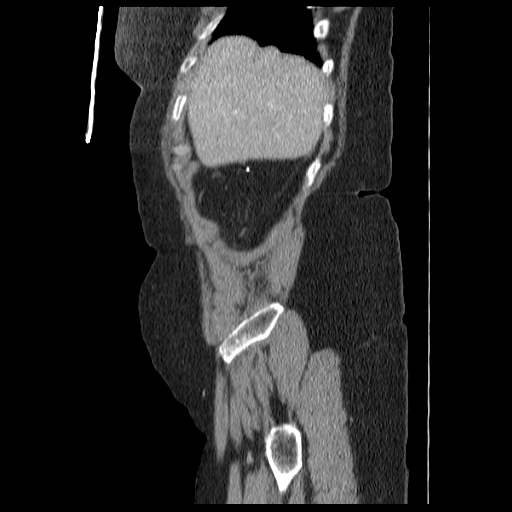
[im 23/101  soft-tissue]
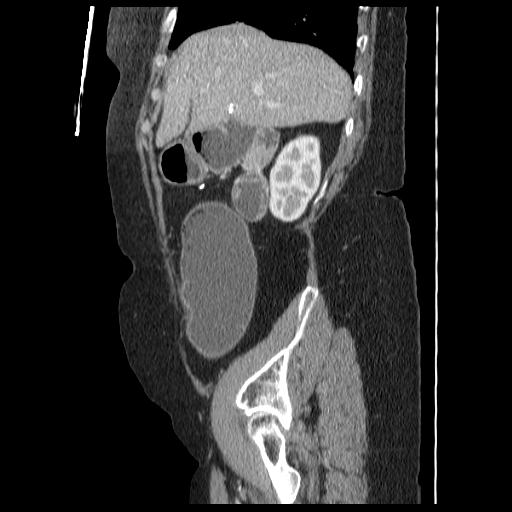
[im 34/101  soft-tissue]
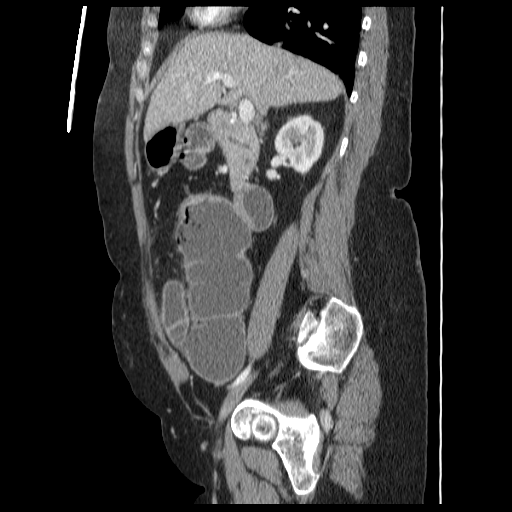
[im 45/101  soft-tissue]
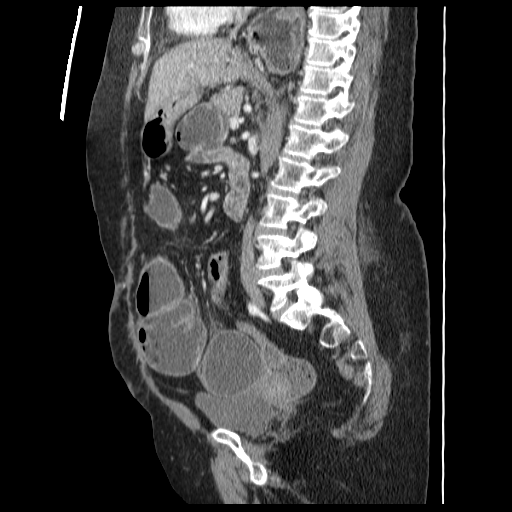
[im 56/101  soft-tissue]
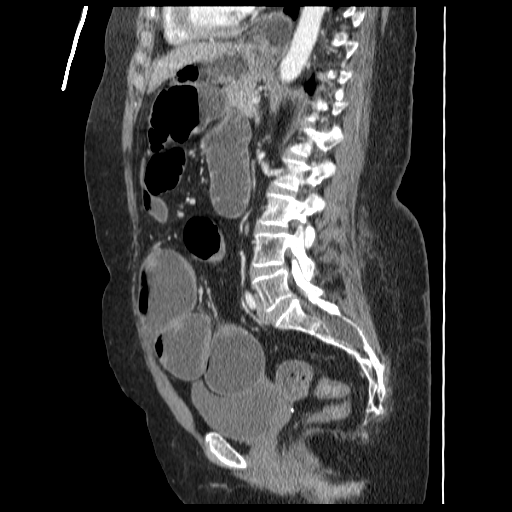
[im 67/101  soft-tissue]
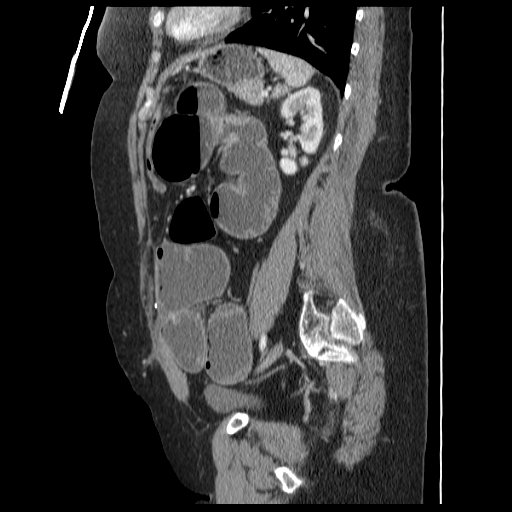

[13 of 32 positions shown; findings below may reference images not displayed]

FINDINGS: The lung bases are clear except for dependent
atelectasis.  There is a large hiatal hernia.

The liver is unremarkable.  This is stable right hepatic lobe cyst.
No biliary dilatation.  The patient has had a cholecystectomy.  The
pancreas is unremarkable.  The spleen is normal in size.  The
adrenal glands and kidneys demonstrate no significant
abnormalities.

The stomach is grossly normal.  The duodenum and small bowel are
dilated with air-fluid levels consistent with a small bowel
obstruction.  The colon is decompressed.

The aorta is normal in caliber.  The major branch vessels are
normal.  No mesenteric or retroperitoneal masses or adenopathy.

The bony structures are unremarkable.
IMPRESSION: 1.  Markedly dilated small bowel consistent with a small bowel
obstruction.  A focal area of small bowel wall thickening in the
right abdomen may be an area of recurrent Crohn's disease.  This
looks very similar to the prior study.
2.  Status post cholecystectomy with stable mild common bile duct
dilatation.

CT PELVIS
FINDINGS: The rectum, sigmoid colon and uterus and ovaries are
unremarkable.  There are markedly dilated small bowel loops
measuring up to 5 cm in diameter.  There are extensive surgical
changes and there is an area in the region of the medial terminal
ileum demonstrates mild enhancement in the wall thickening which
could be a focus of Crohn's disease.

The bony pelvis is intact.
IMPRESSION: 1.  High-grade small bowel obstruction with a transition area in
the region of the distal ileum which may reflect a focus of
recurrent Crohn's disease.
2.  No other significant pelvic findings.

## 2010-02-26 IMAGING — CR DG ABDOMEN 1V
1 series · 1 of 1 positions shown · non-contrast
Comparison: Abdomen films of 02/11/2009

CLINICAL DATA: Small bowel obstruction, exacerbation of Crohn's
disease

ABDOMEN - 1 VIEW

[w abdomen upright]
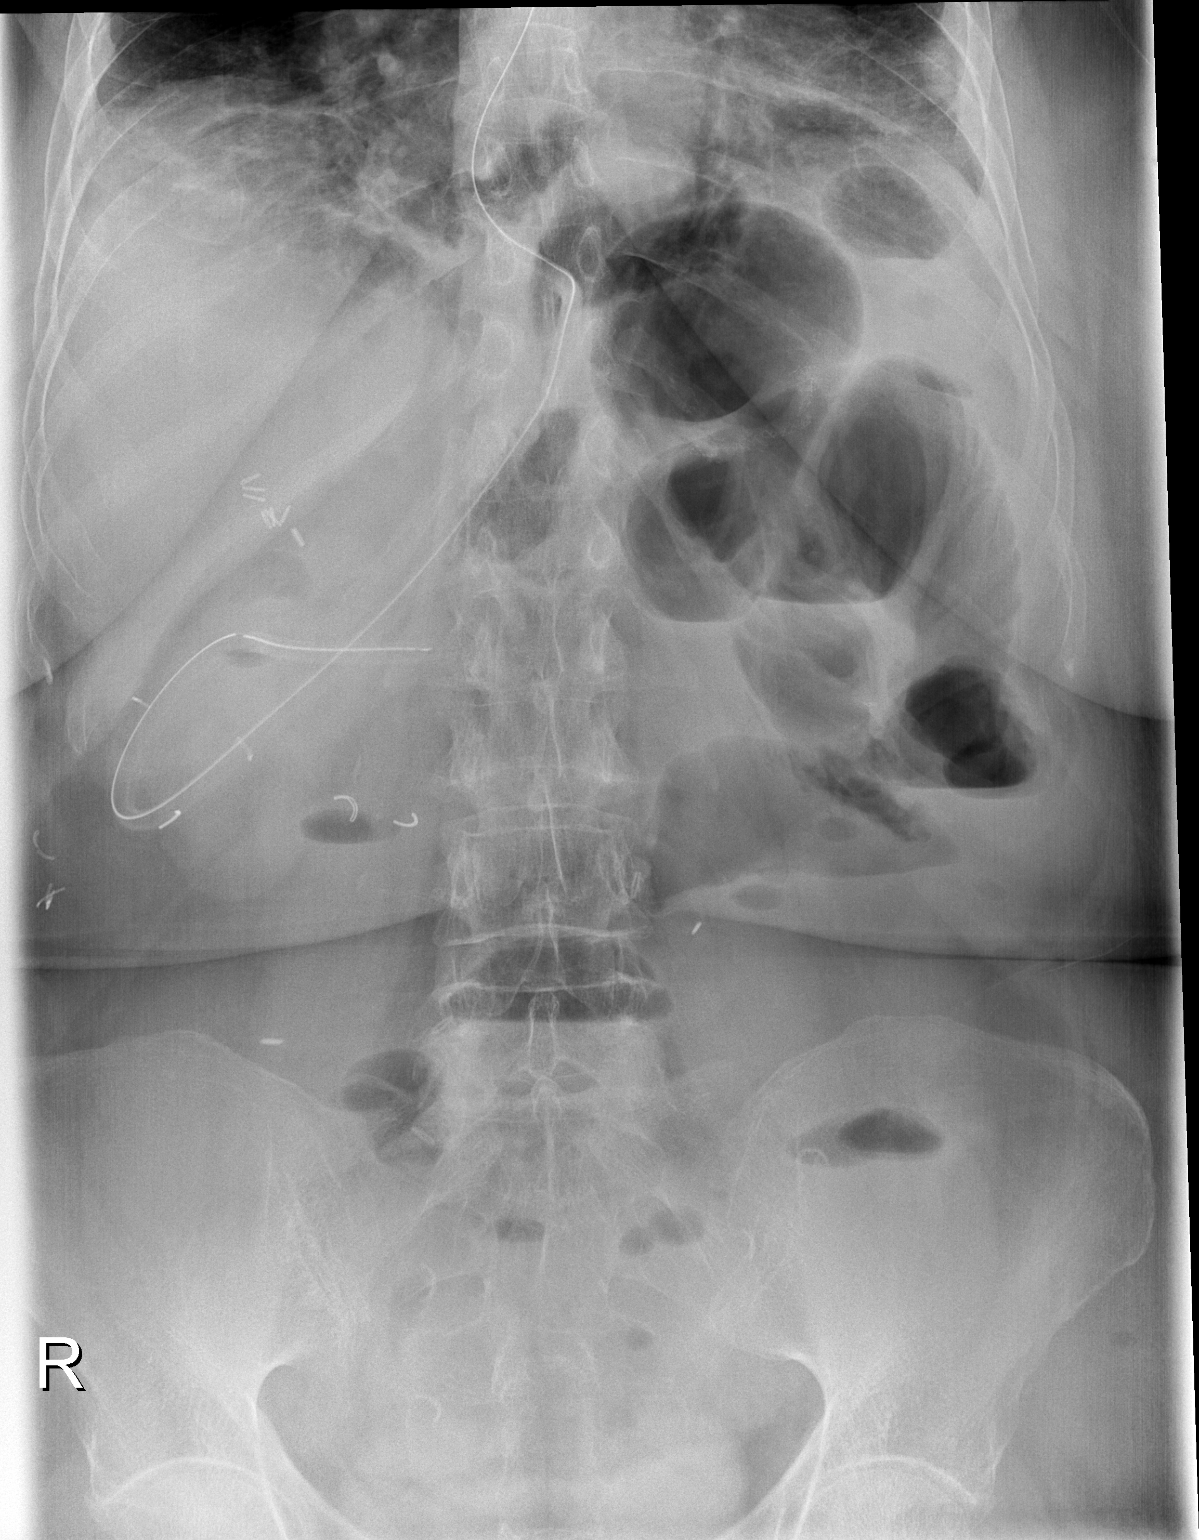

[1 of 1 positions shown; findings below may reference images not displayed]

FINDINGS: An NG tube is present with a loop distally probably in
the region of the distal body of the stomach or proximal duodenum.
There is still gaseous distention of small bowel loops with
differential air-fluid levels consistent with small bowel
obstruction.  Very little colonic bowel gas is seen.  Basilar
linear atelectasis is noted at both lung bases.
IMPRESSION: Persistent SBO pattern.  NG tube looped in distal stomach or
proximal duodenal loop.

## 2010-02-26 IMAGING — CR DG CHEST 2V
2 series · 2 of 2 positions shown · non-contrast
Comparison: None.

CLINICAL DATA: Small bowel obstruction.  Exacerbation of Crohn's
disease.

CHEST - 2 VIEW 02/12/2009:

[w chest pa]
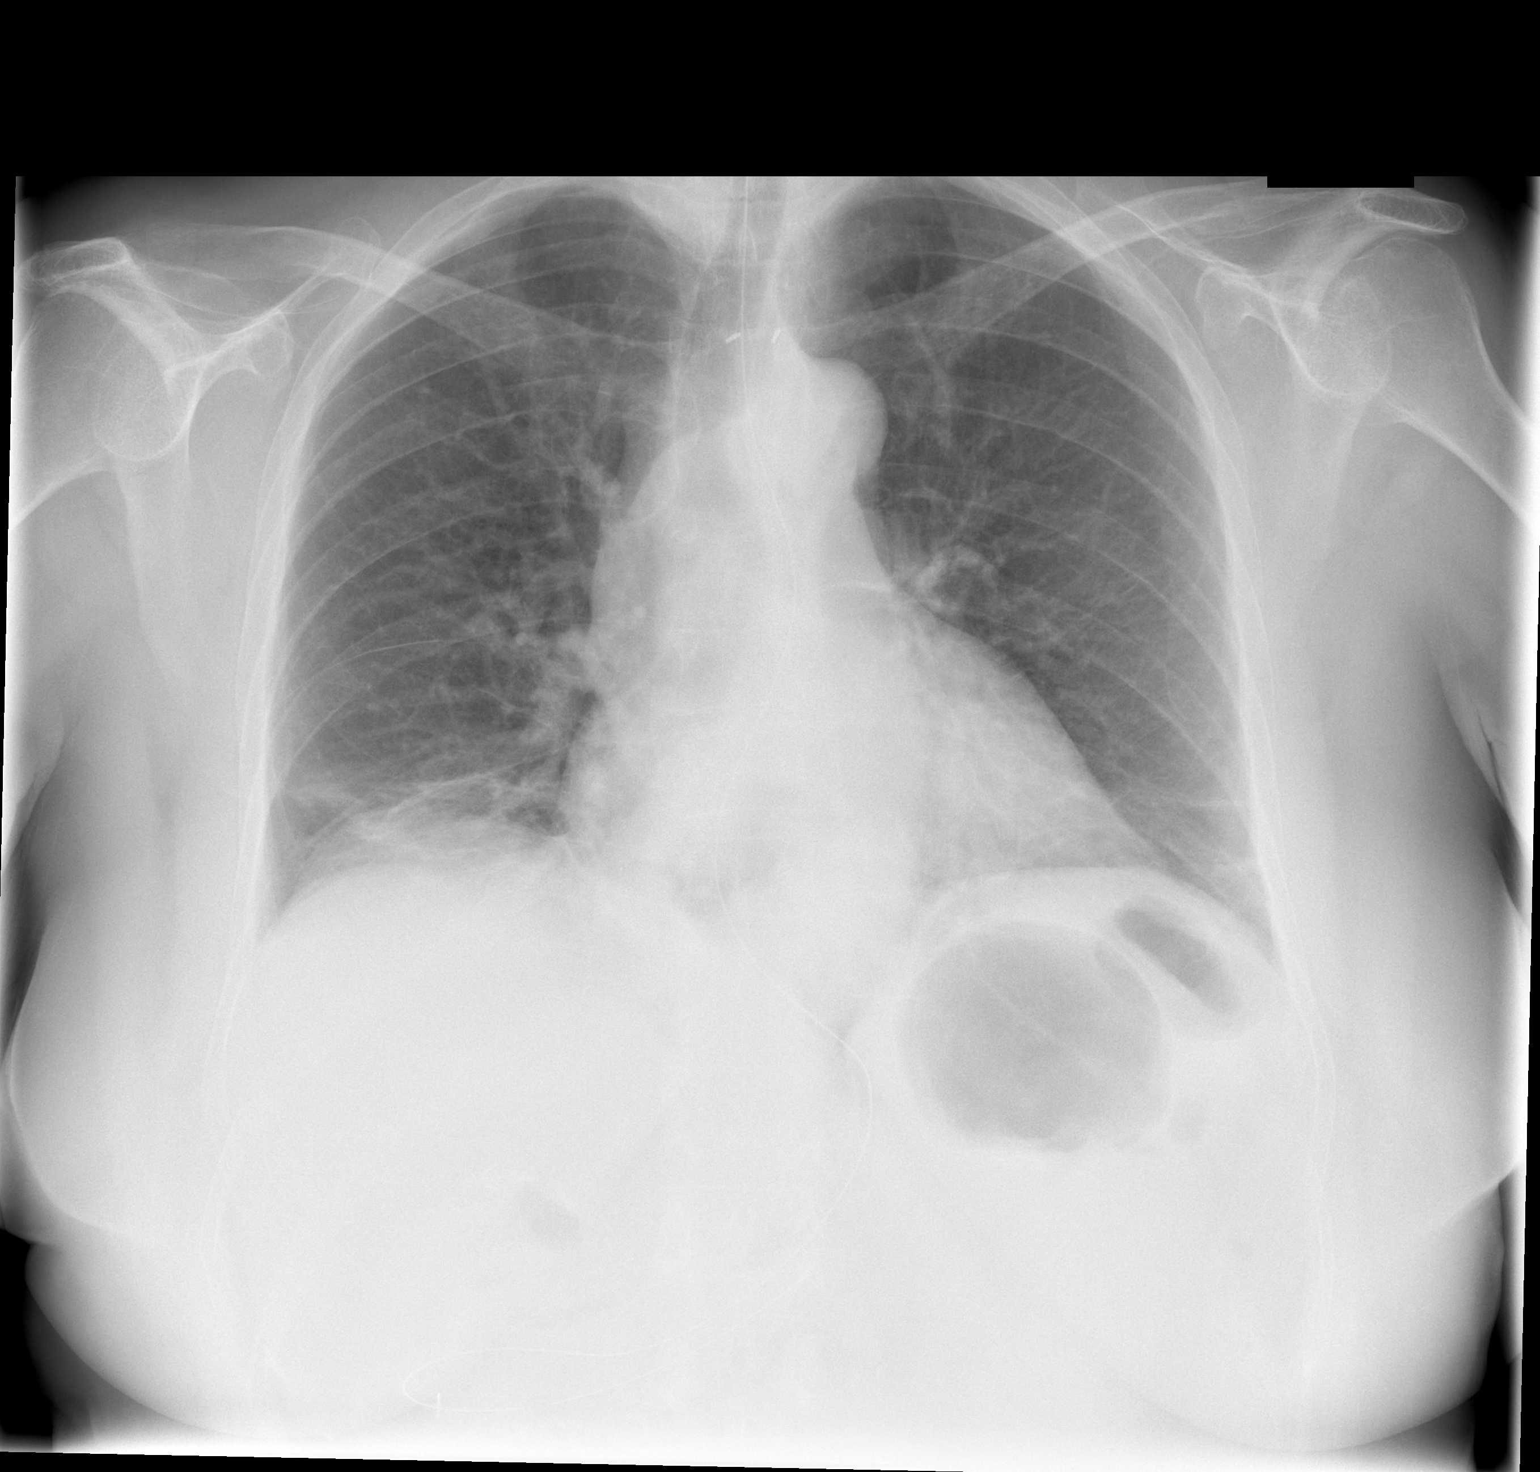

[w chest lat]
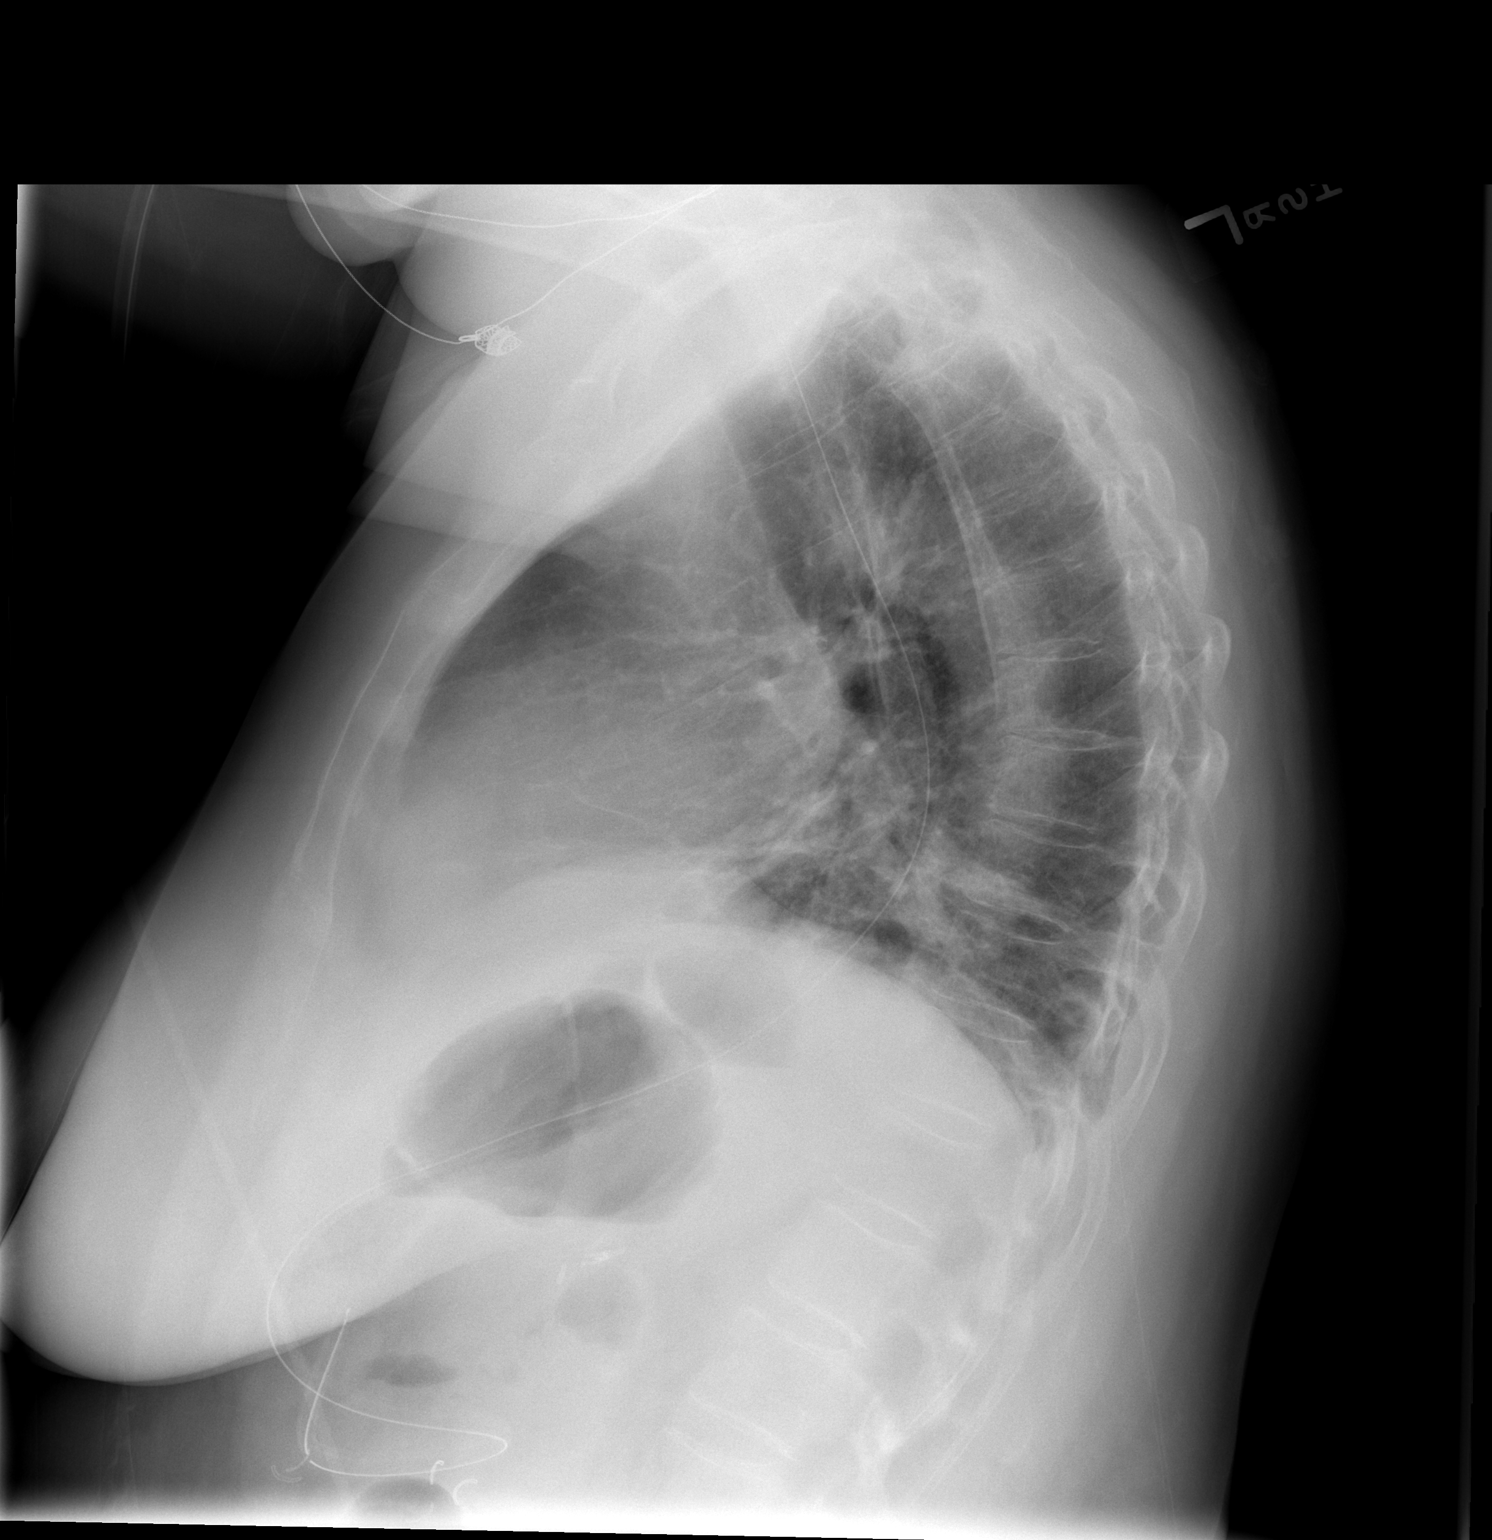

[2 of 2 positions shown; findings below may reference images not displayed]

FINDINGS: Suboptimal inspiration with atelectasis in the lower
lobes and lingula.  Lungs otherwise clear.  Heart size normal for
technique and degree of inspiration.  Thoracic aorta mildly
tortuous.  Small moderate sized hiatal hernia.  Nasogastric tube
courses below the diaphragm and is looped in the stomach.  No
pleural effusions.  Visualized bony thorax intact.
IMPRESSION: Suboptimal inspiration accounts for atelectasis in the lower lobes
and lingula.  No acute cardiopulmonary disease otherwise.  Hiatal
hernia.

## 2010-03-27 ENCOUNTER — Emergency Department (HOSPITAL_COMMUNITY): Admission: EM | Admit: 2010-03-27 | Discharge: 2010-03-28 | Payer: Self-pay | Admitting: Emergency Medicine

## 2010-05-04 IMAGING — CR DG ABDOMEN ACUTE W/ 1V CHEST
5 series · 5 of 5 positions shown · non-contrast
Comparison: 02/12/2009.

CLINICAL DATA: Crohn's disease.  Abdominal pain.

ACUTE ABDOMEN SERIES (ABDOMEN 2 VIEW & CHEST 1 VIEW)

[w chest pa (1 of 2)]
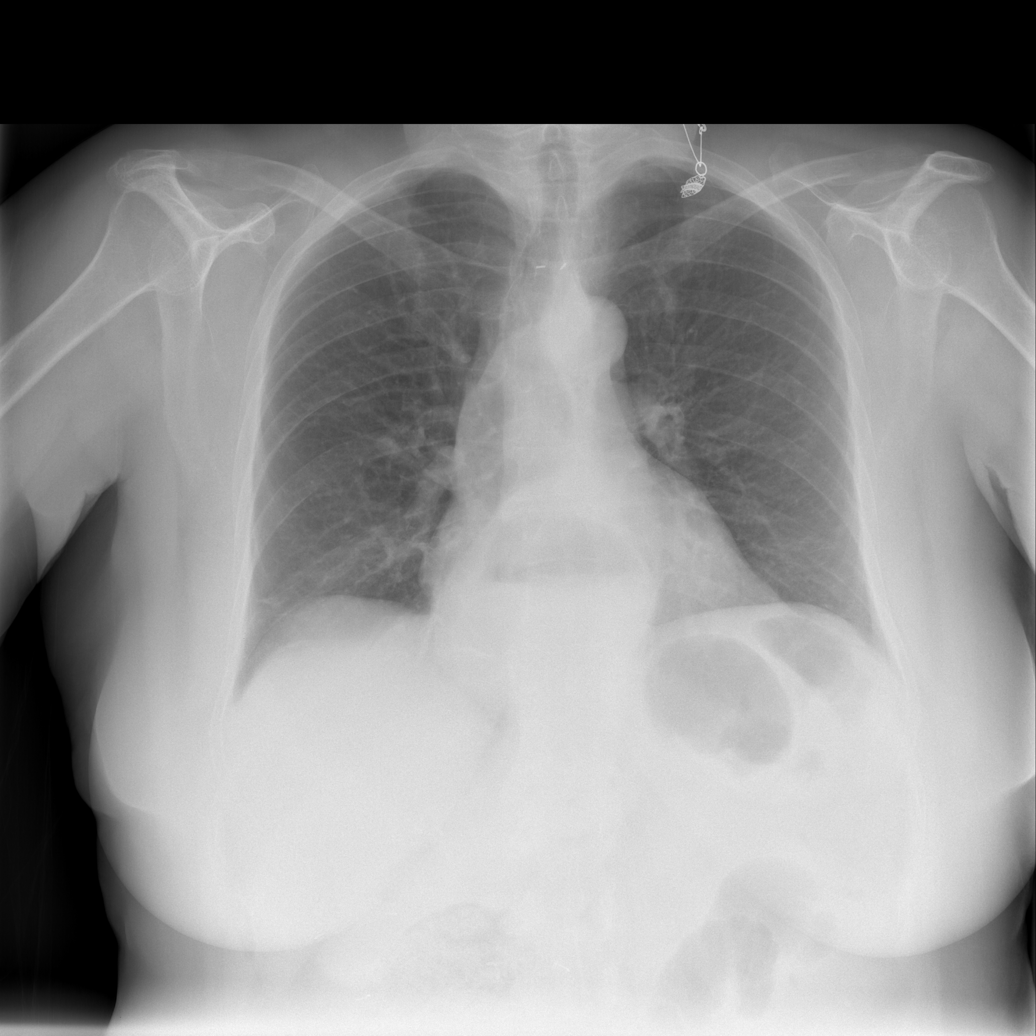

[w chest pa (2 of 2)]
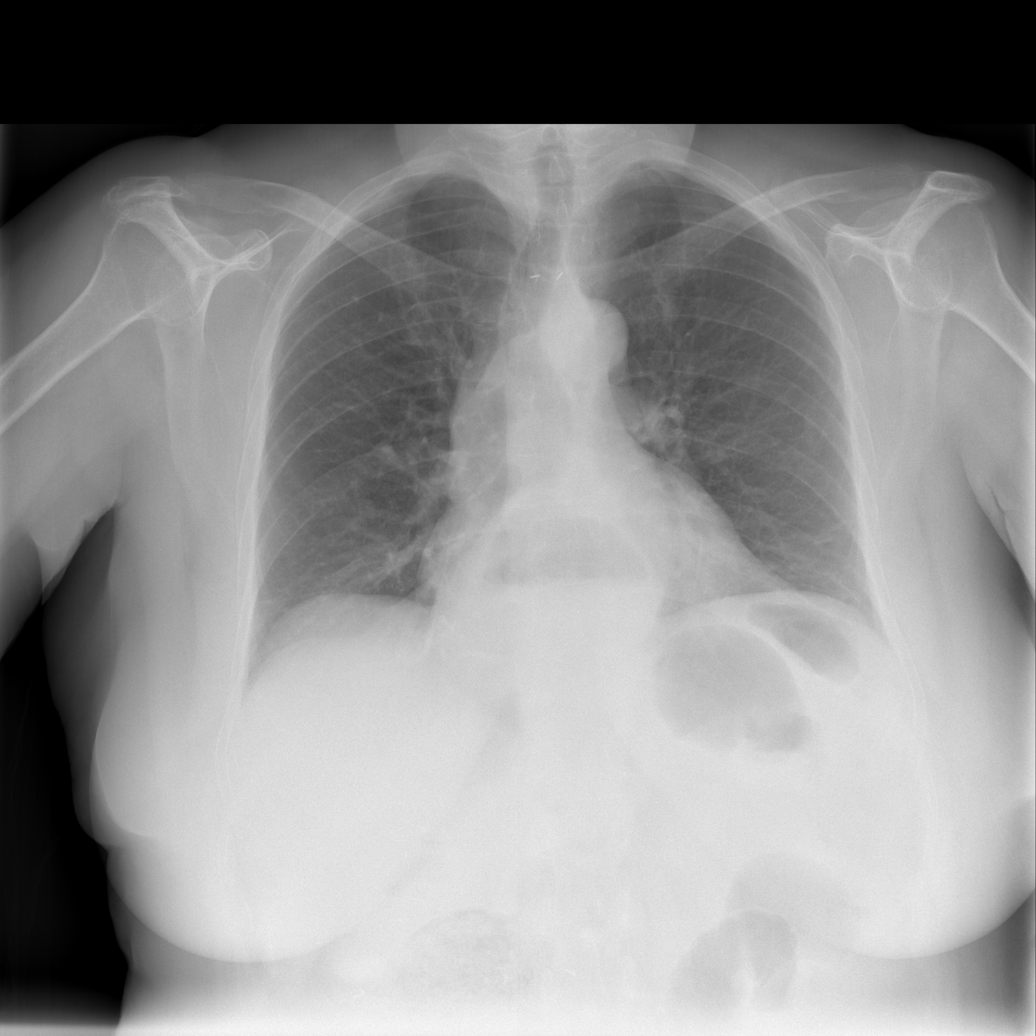

[w abdomen upright *]
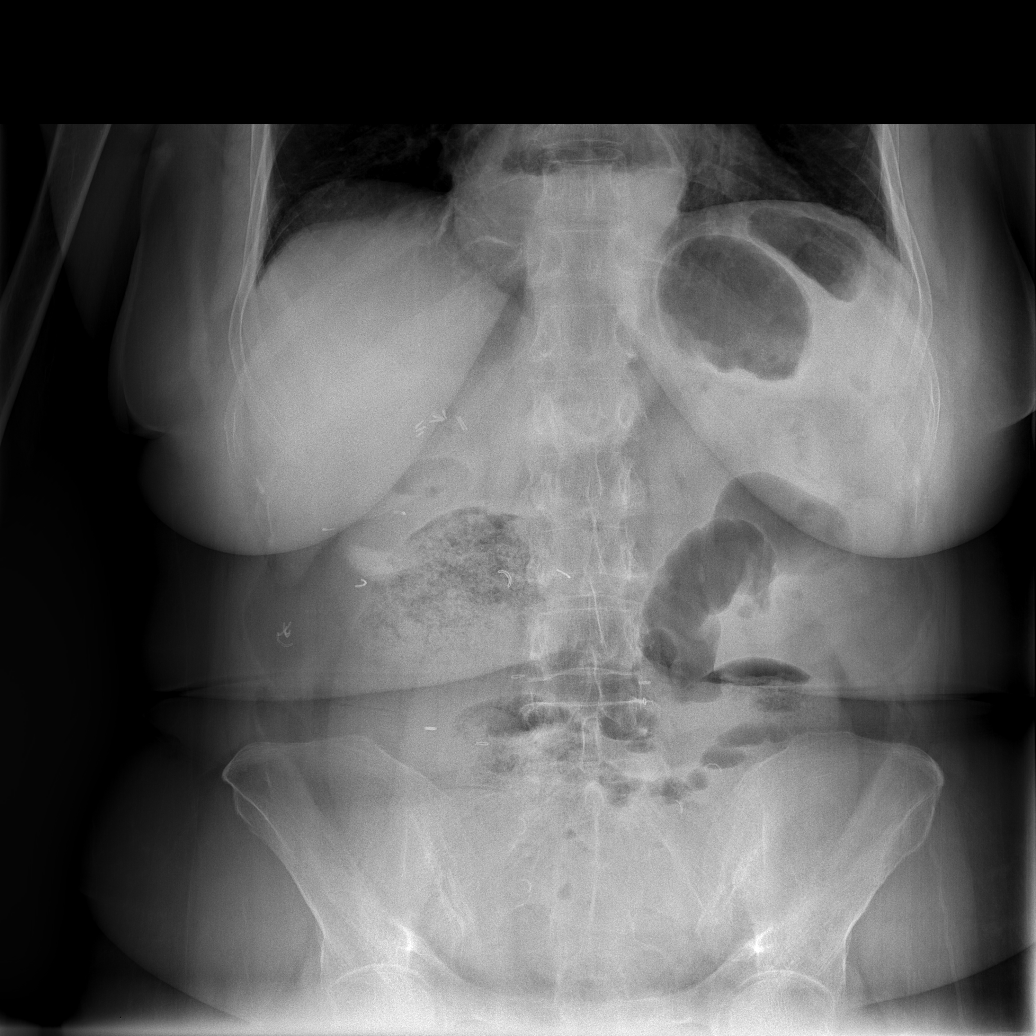

[t abdomen supine (1 of 2)]
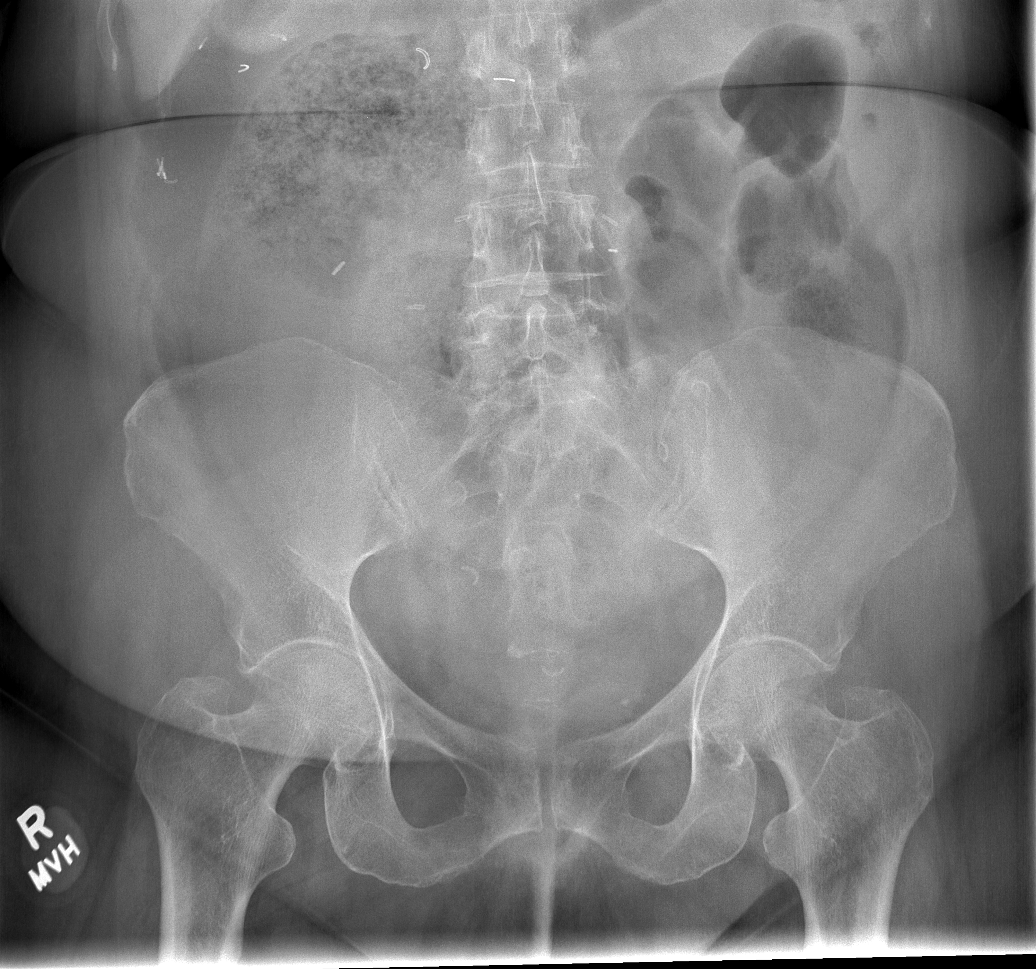

[t abdomen supine (2 of 2)]
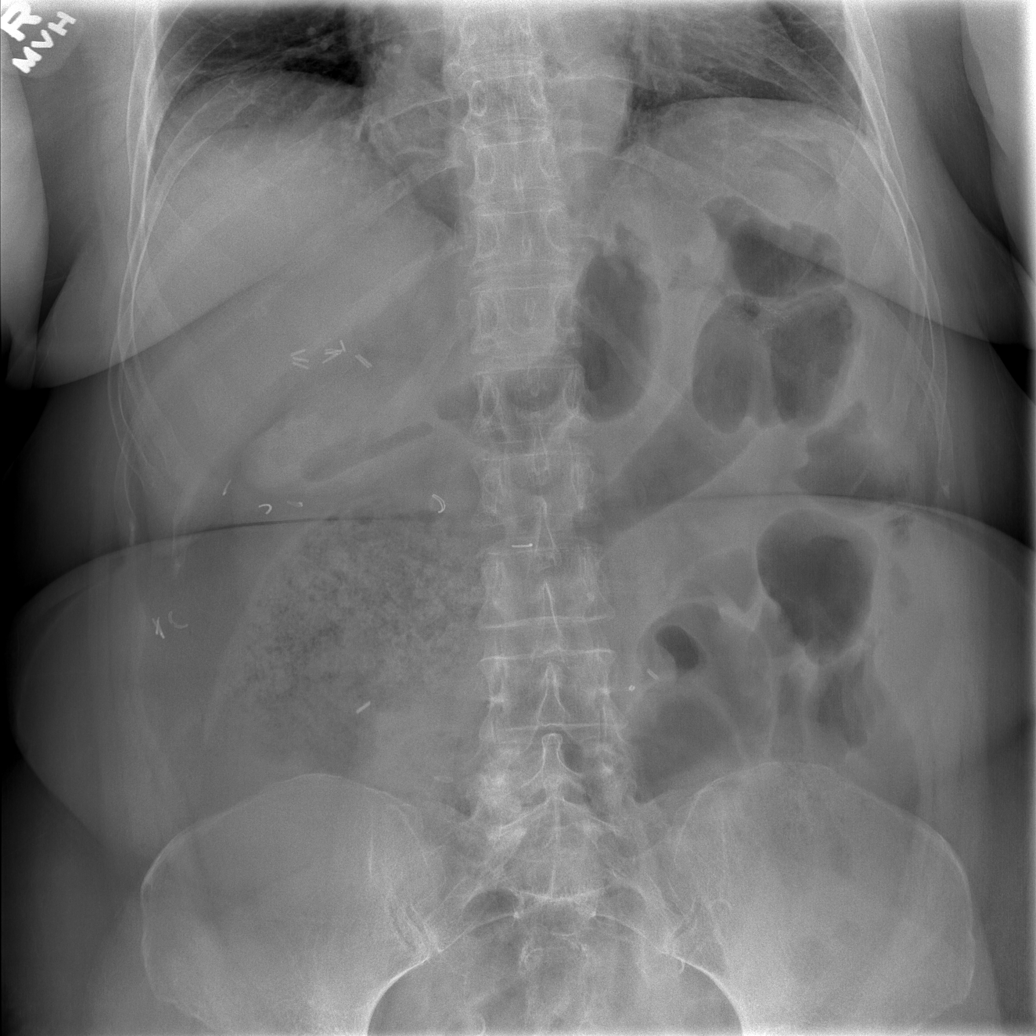

[5 of 5 positions shown; findings below may reference images not displayed]

FINDINGS: Hiatal hernia.  Tortuous aorta.  Mild cardiomegaly.
Central pulmonary vascular prominence.  No congestive heart failure
or segmental infiltrate. Minimal basilar atelectatic changes.

Gas distended small bowel loops with minimally thickened folds.  No
free air.  Stool right colon.
IMPRESSION: Gas filled slightly distended small bowel loops with slightly
thickened folds.  This may reflect underlying Crohn's disease.

Hiatal hernia.

Tortuous aorta.

Minimal basilar atelectatic changes.

## 2010-05-14 IMAGING — CR DG ABDOMEN ACUTE W/ 1V CHEST
3 series · 3 of 3 positions shown · non-contrast
Comparison: 04/20/2009

CLINICAL DATA: Abdominal pain.

ACUTE ABDOMEN SERIES (ABDOMEN 2 VIEW & CHEST 1 VIEW)

[w chest pa]
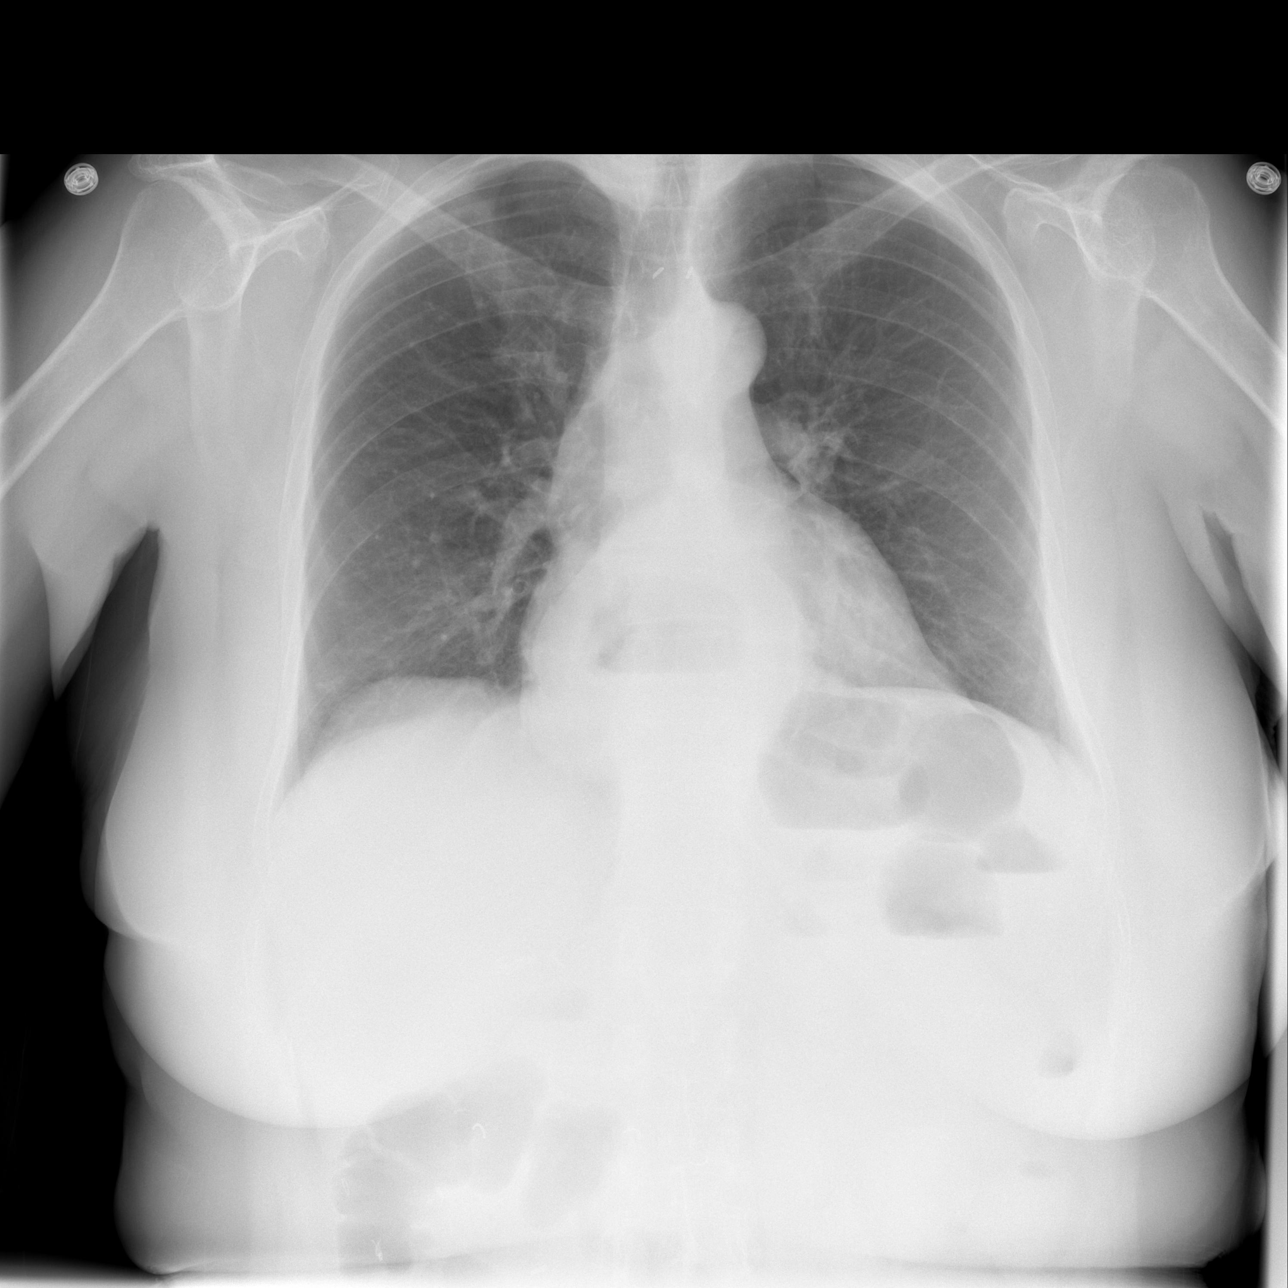

[w abdomen upright]
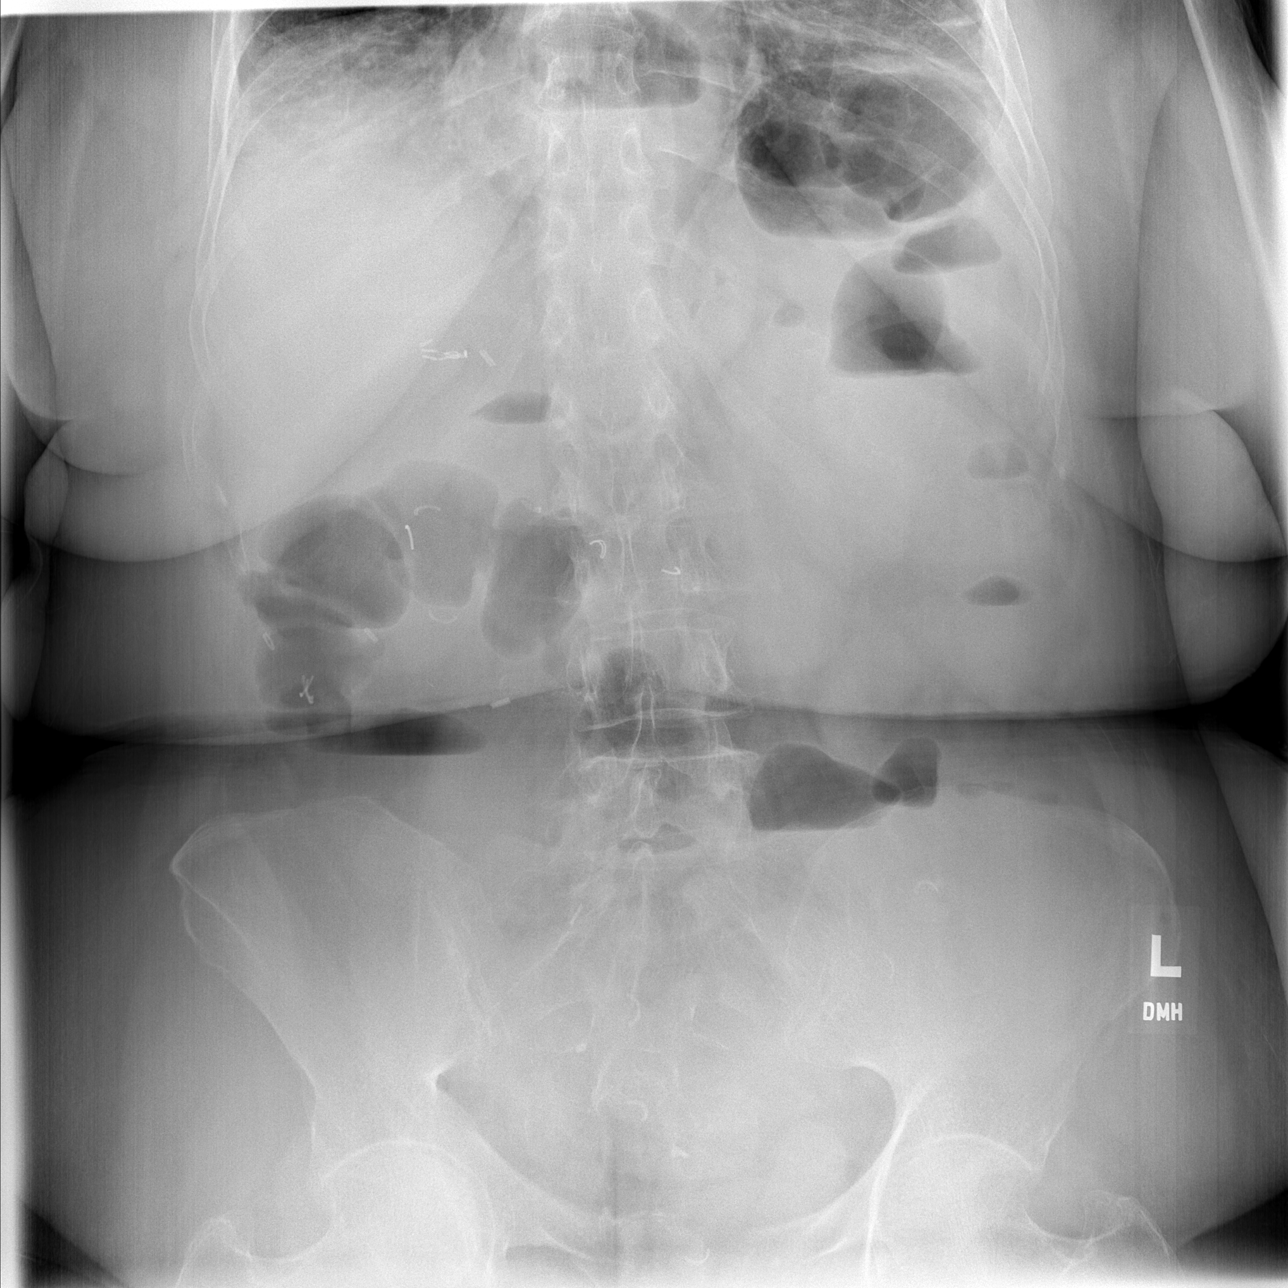

[t abdomen supine]
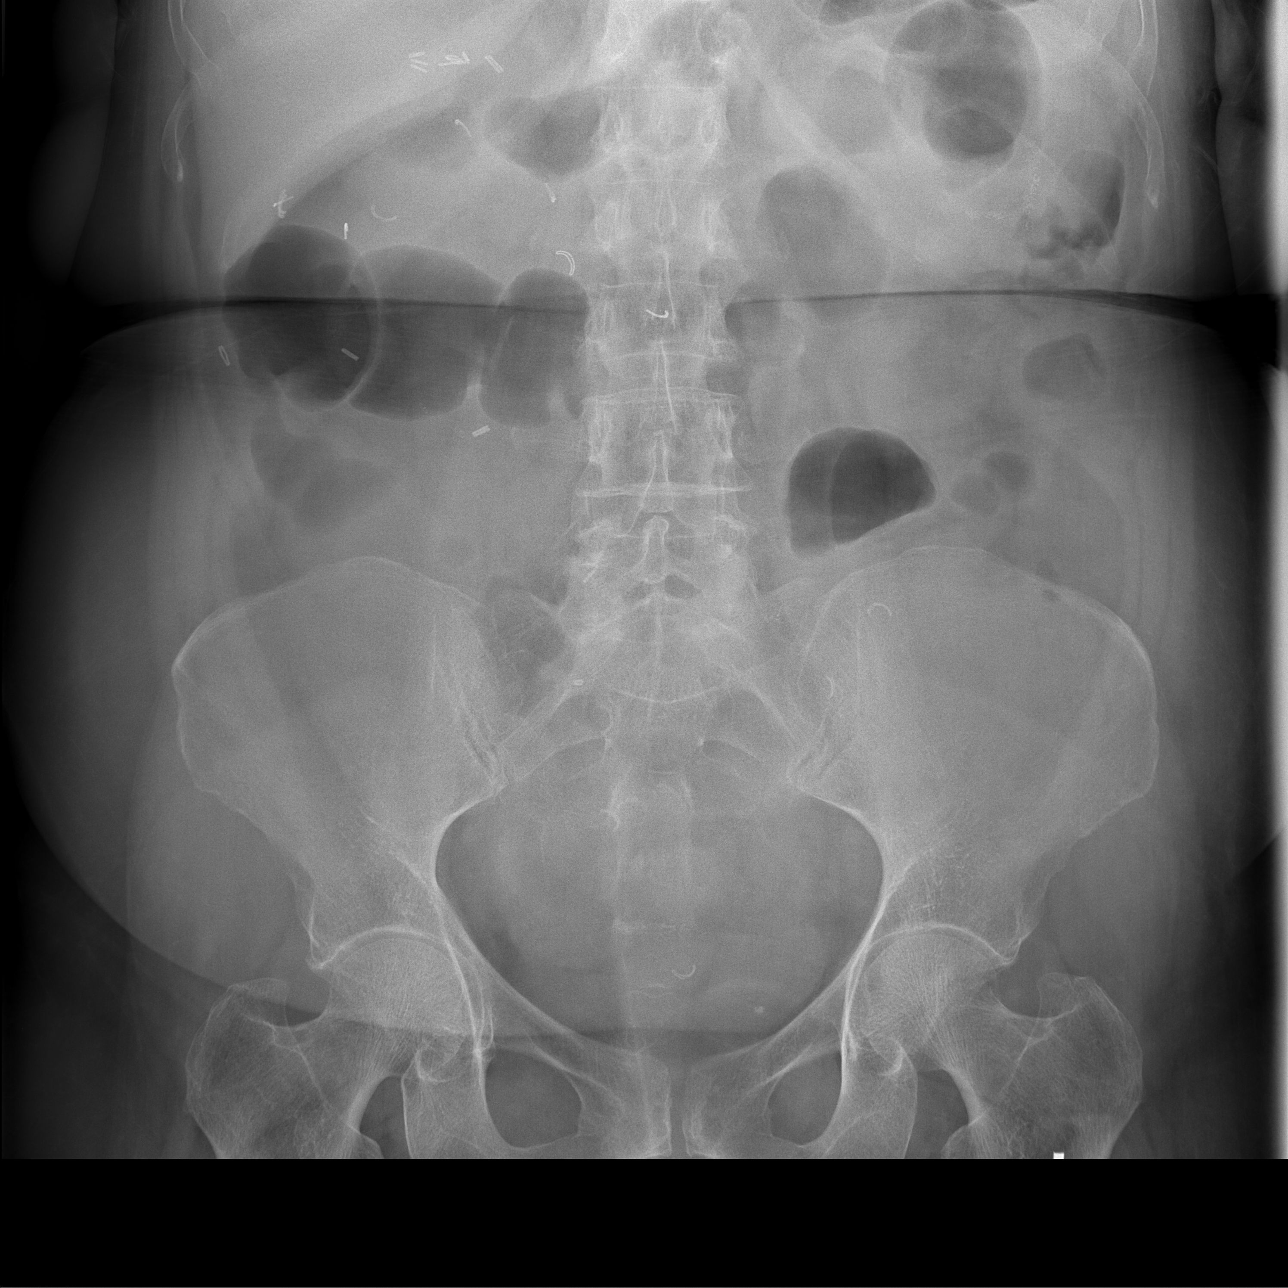

[3 of 3 positions shown; findings below may reference images not displayed]

FINDINGS: The upright chest x-ray demonstrates a moderate to large
hiatal hernia which is stable.  The cardiac silhouette, mediastinal
and hilar contours are stable.  No acute pulmonary findings.

Two views of the abdomen demonstrate scattered air in the colon.
There are a few air-fluid levels.  No dilated loops of small bowel
to suggest obstruction.  Surgical changes are noted.  The bony
structures are unremarkable.
IMPRESSION: 1.  No acute cardiopulmonary findings.  Stable moderate to large
hiatal hernia.
2.  Scattered air in the colon.  No definite findings for small
bowel obstruction or free air.

## 2010-05-16 IMAGING — CT CT PELVIS W/ CM
2 of 5 series · 14 of 32 positions shown, 19 images · IV contrast (agent unspecified)
Comparison: 02/11/2009

CT ABDOMEN

CLINICAL DATA: Abdominal pain/nausea and vomiting/history of
Crohn's disease with colon resection

CT ABDOMEN AND PELVIS WITH CONTRAST
TECHNIQUE: Multidetector CT imaging of the abdomen and pelvis was
performed using the standard protocol following bolus
administration of intravenous contrast.
Contrast: 100 ml Pmnipaque-Z99

[Series 2: routine abdomen · axial · 0.83mm/px · z∈[-390,-60]mm · 8 of 86 slices shown, 13 images]
[im 10/86  soft-tissue]
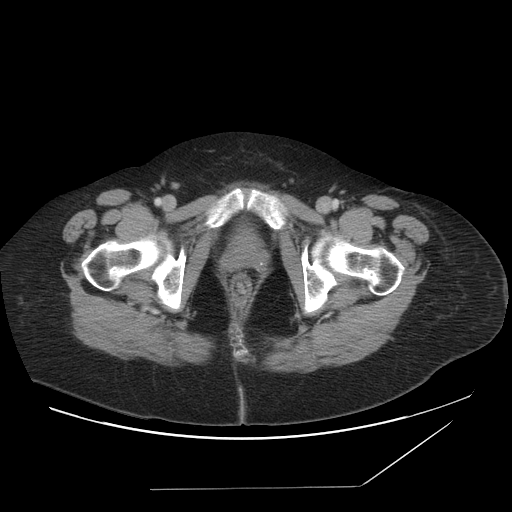
[im 10/86  bone]
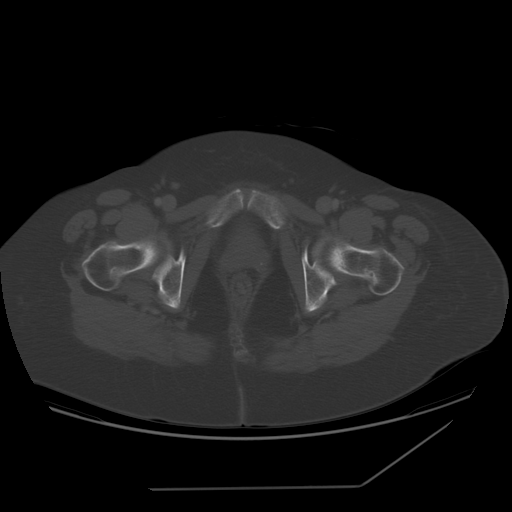
[im 19/86  soft-tissue]
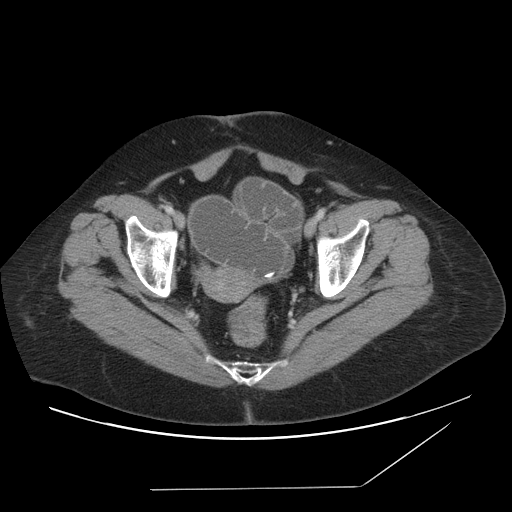
[im 29/86  soft-tissue]
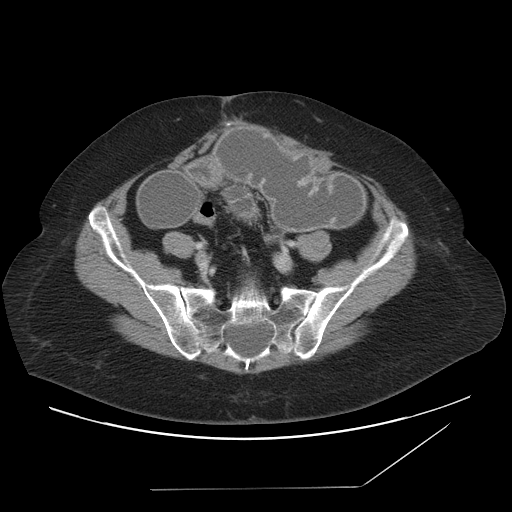
[im 38/86  soft-tissue]
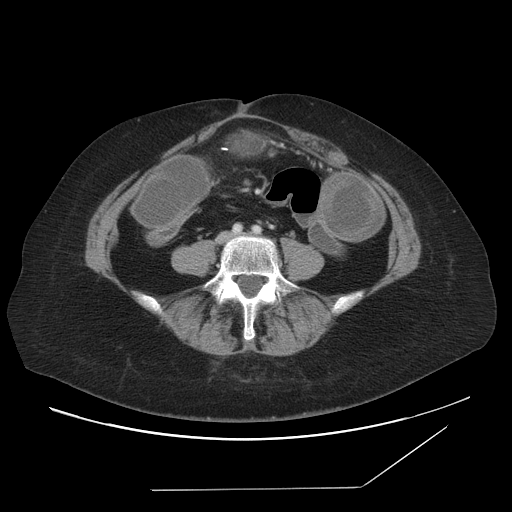
[im 48/86  soft-tissue]
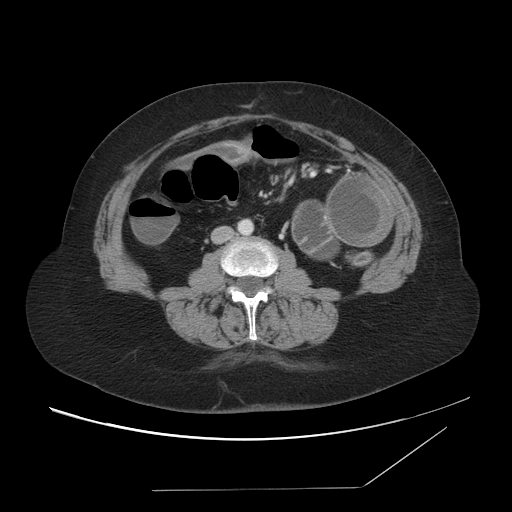
[im 48/86  lung]
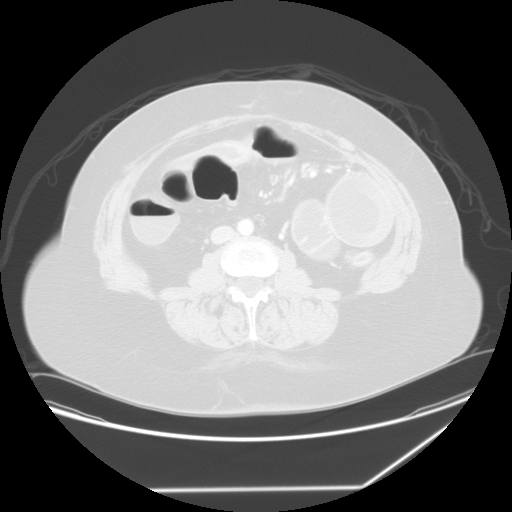
[im 57/86  soft-tissue]
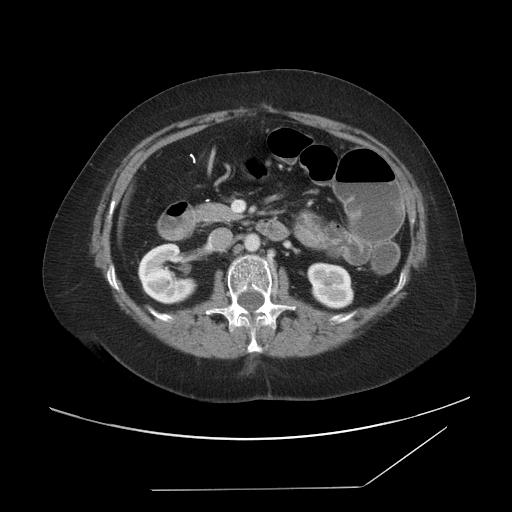
[im 57/86  lung]
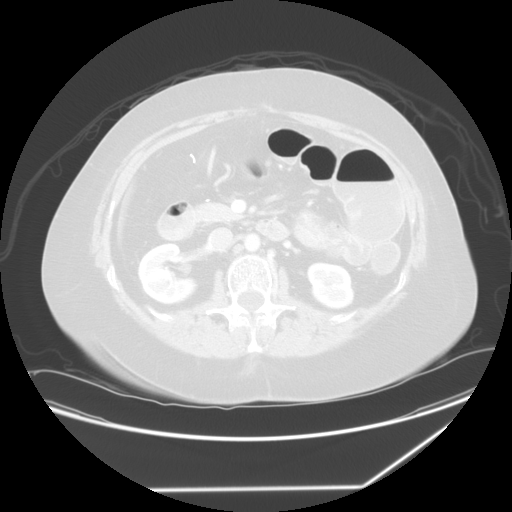
[im 67/86  soft-tissue]
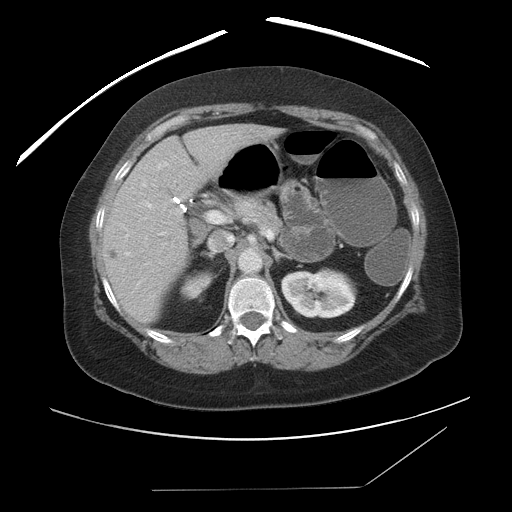
[im 67/86  lung]
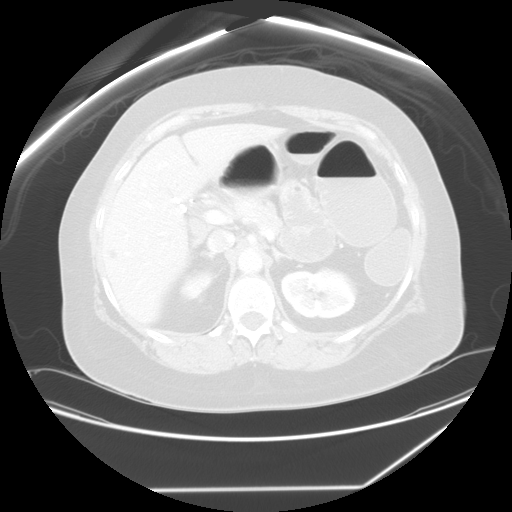
[im 76/86  soft-tissue]
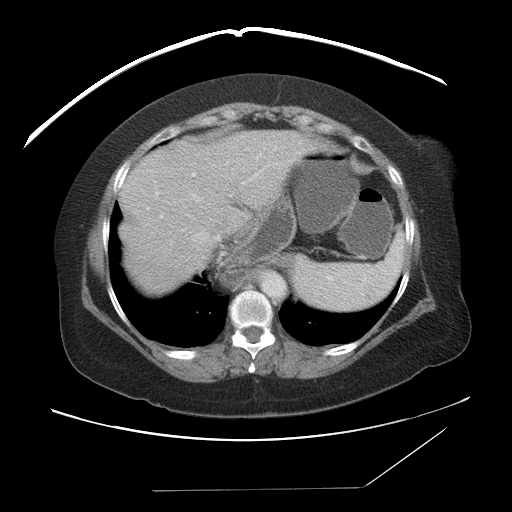
[im 76/86  lung]
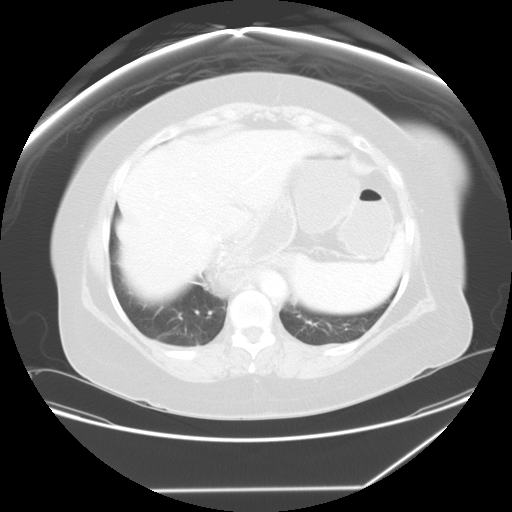

[Series 400: reformatted · sagittal · 0.85mm/px · 6 of 100 slices shown]
[im 12/100  soft-tissue]
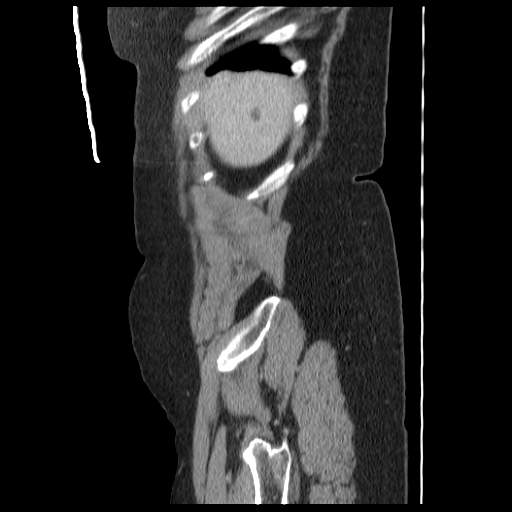
[im 23/100  soft-tissue]
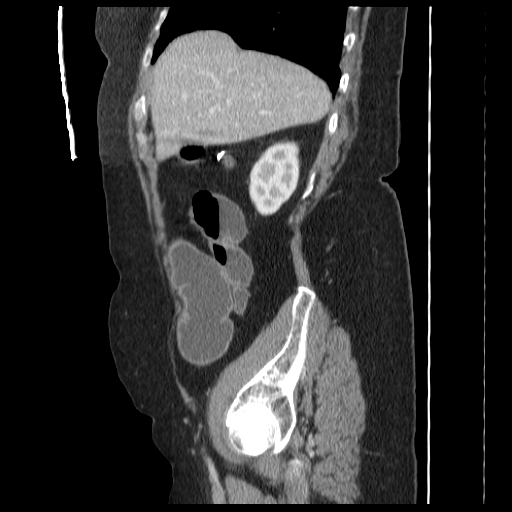
[im 34/100  soft-tissue]
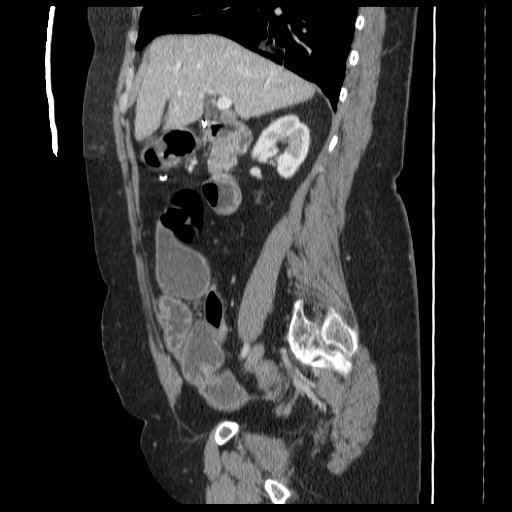
[im 45/100  soft-tissue]
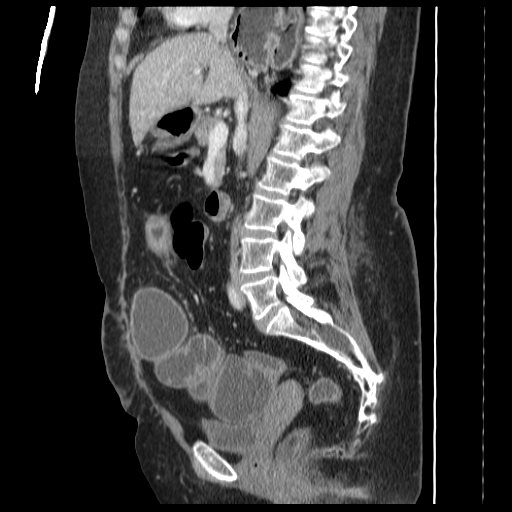
[im 56/100  soft-tissue]
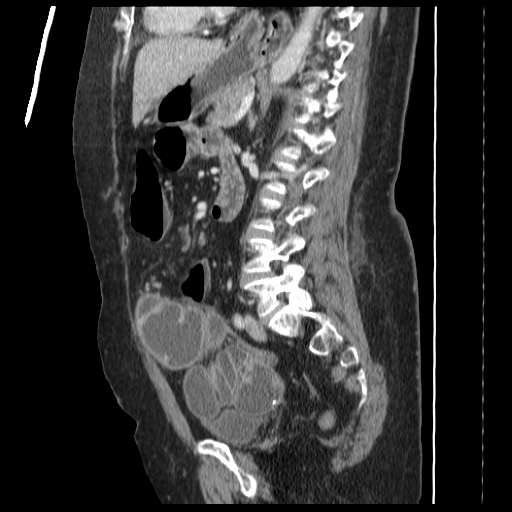
[im 67/100  soft-tissue]
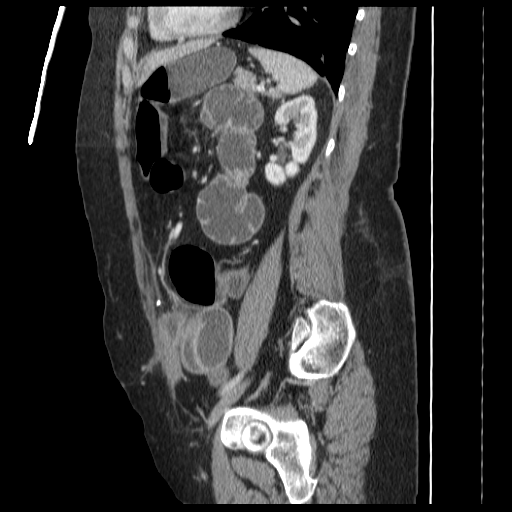

[14 of 32 positions shown; findings below may reference images not displayed]

FINDINGS: Highest cuts include the lung bases which appear clear.
There is a large hiatal hernia again noted.

Liver unremarkable with a small cyst of the right lobe again noted.
Spleen, pancreas, and adrenal glands remain normal.

There are markedly dilated loops of small bowel as before.  No
colon is visualized in the upper abdomen in this patient who is had
partial resection of the colon.  There are several areas where
there appears to be bowel wall thickening and some areas of
enhancement suggesting active Crohn's disease.  No obvious
perforation or abscess.  Similar changes were noted on the prior
study.

Prior cholecystectomy.  No significant  biliary dilatation at this
time.
IMPRESSION: 1. Findings consistent with small bowel obstruction with changes
suggesting active Crohn's disease as before.  No definite
perforation or abscess.
2.  Large hiatal hernia.

CT PELVIS
FINDINGS: There is evidence for a nondilated distal colon.  There
are several areas of surgical clips suggesting multiple bowel
anastomoses.  There are several areas of small bowel wall
enhancement suggesting active Crohn's disease.  At this time there
is no visible perforation.  A focal site causing obstruction is not
evident.
IMPRESSION: Small bowel obstruction with the CT findings suggesting active
Crohn's disease in several different areas.  No obvious perforation
or abscess.  Similar changes were noted previously.

## 2010-05-17 IMAGING — CR DG ABDOMEN 2V
2 series · 2 of 2 positions shown · non-contrast
Comparison: [DATE]

CLINICAL DATA: Small bowel obstruction and Crohn's disease

ABDOMEN - 2 VIEW

[w abdomen upright]
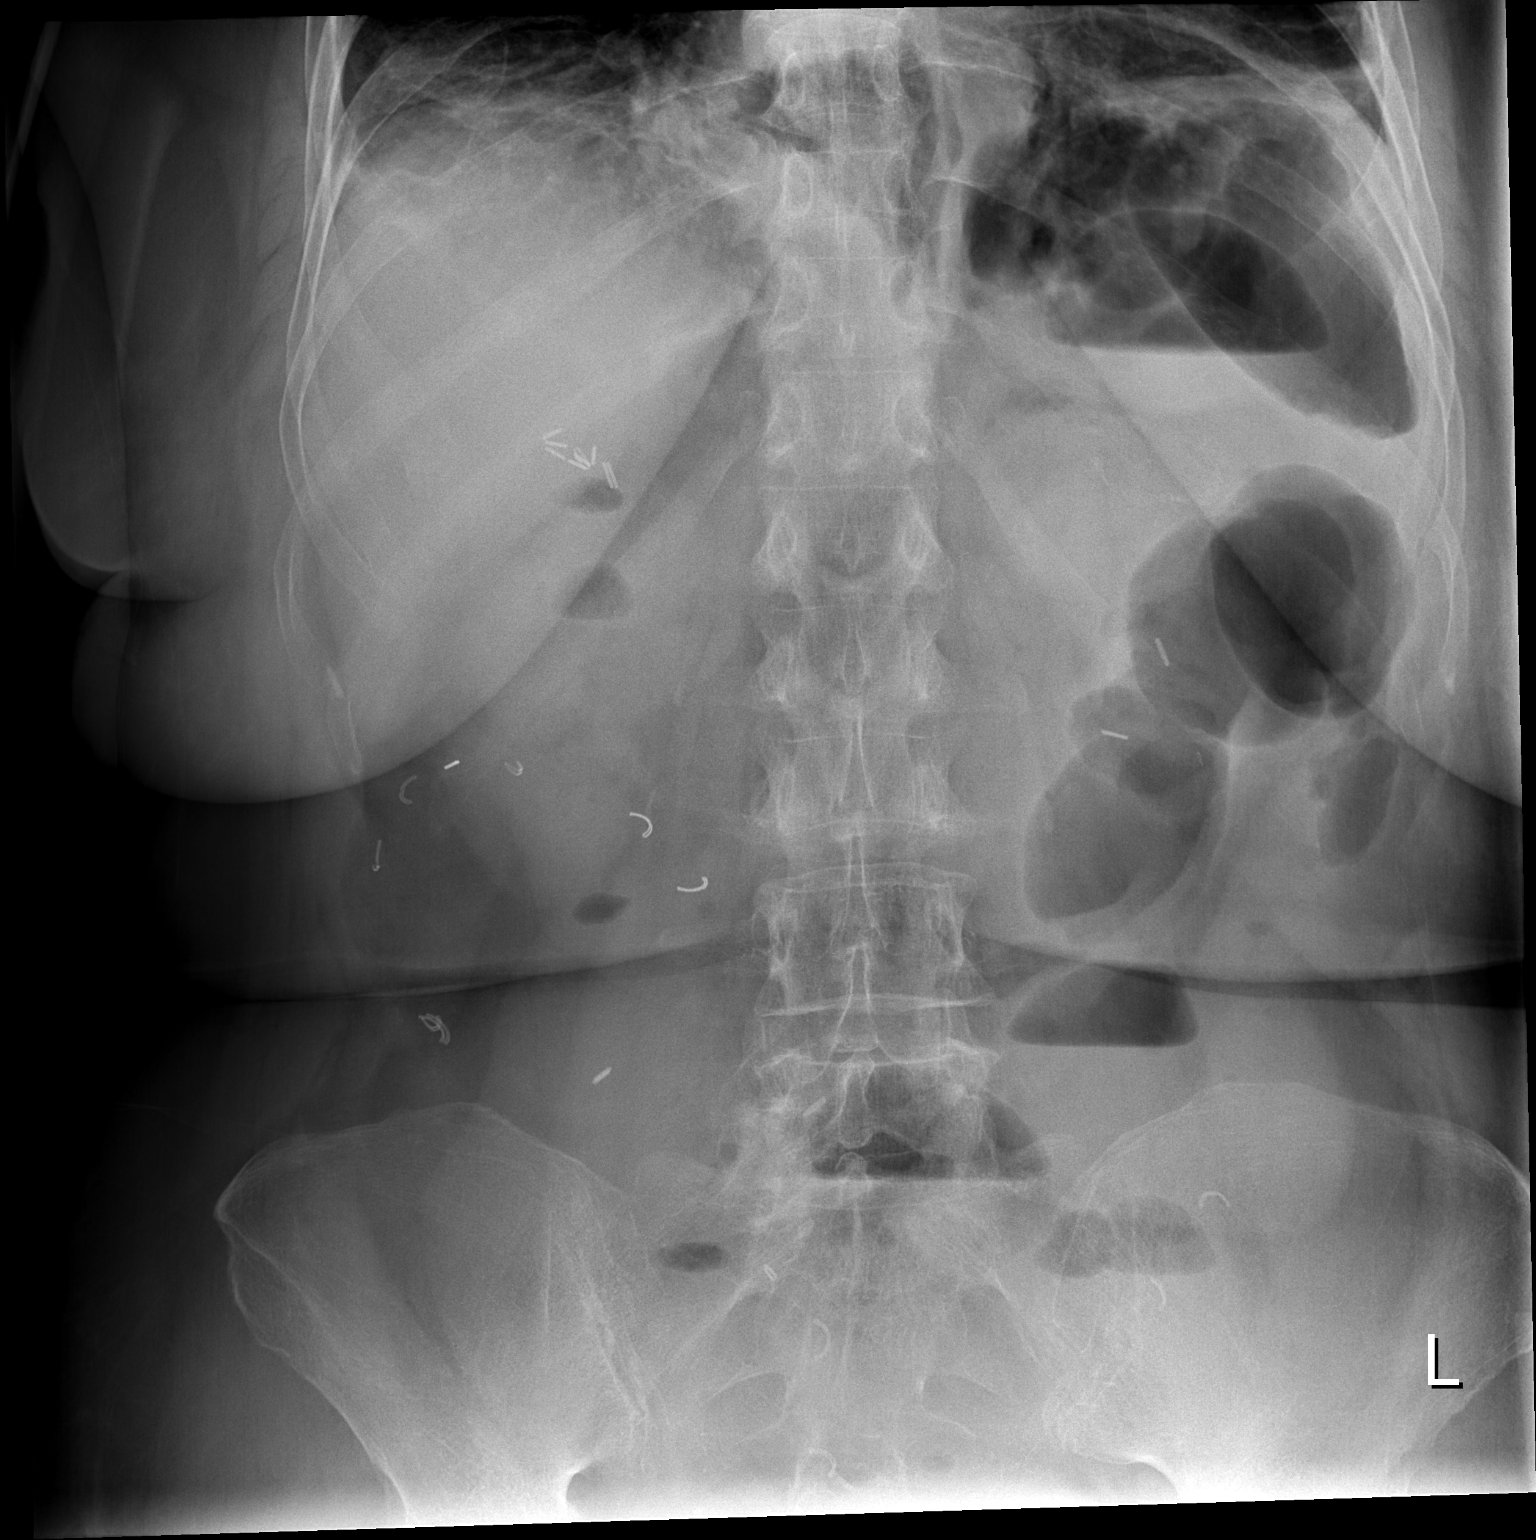

[t abdomen supine]
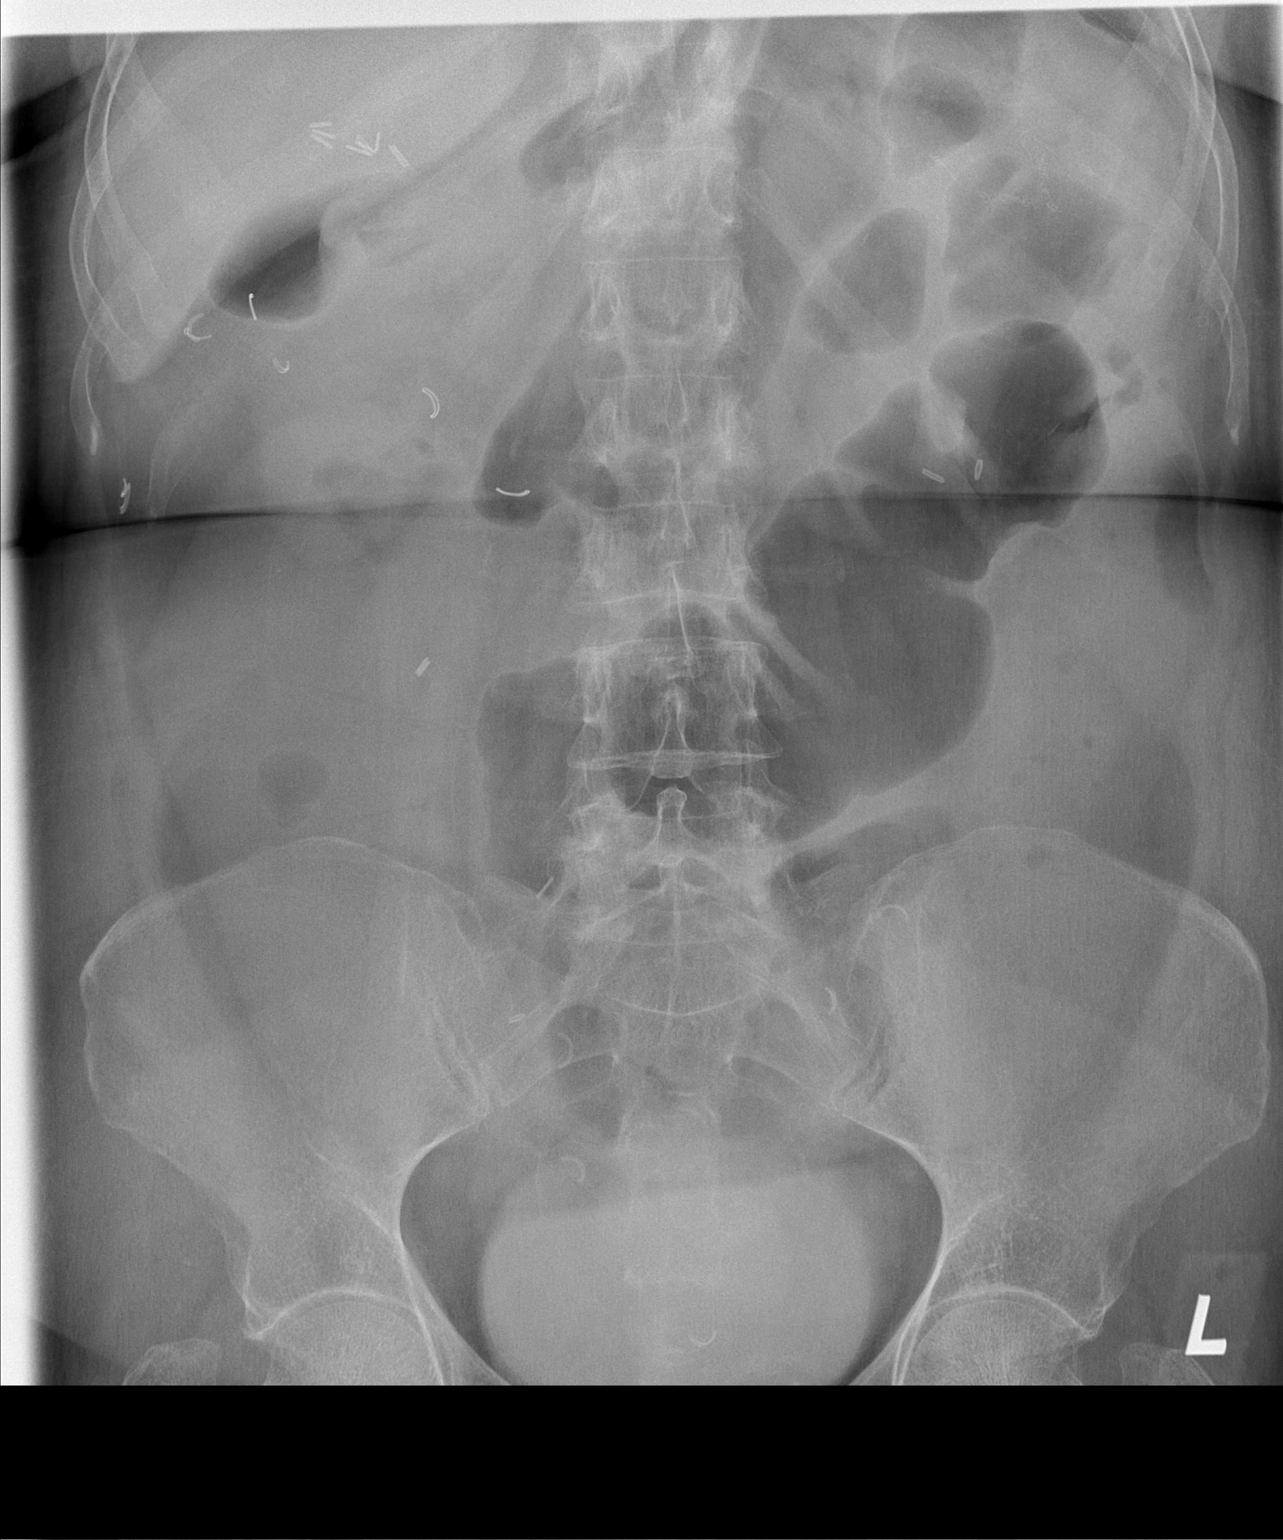

[2 of 2 positions shown; findings below may reference images not displayed]

FINDINGS: Air fluid levels within distended bowel loops in the left
side and lower abdomen are stable.  No free intraperitoneal gas or
pneumatosis.  Sub total colectomy is noted on the prior CT.
IMPRESSION: Stable small bowel obstruction pattern.  No free intraperitoneal
gas.

## 2010-05-28 IMAGING — CR DG ABDOMEN 2V
2 series · 2 of 2 positions shown · non-contrast
Comparison: Abdominal series 04/25/2009

CLINICAL DATA: Abdominal pain with vomiting .  History of Crohn's
disease and colostomy.

ABDOMEN - 2 VIEW

[w abdomen upright]
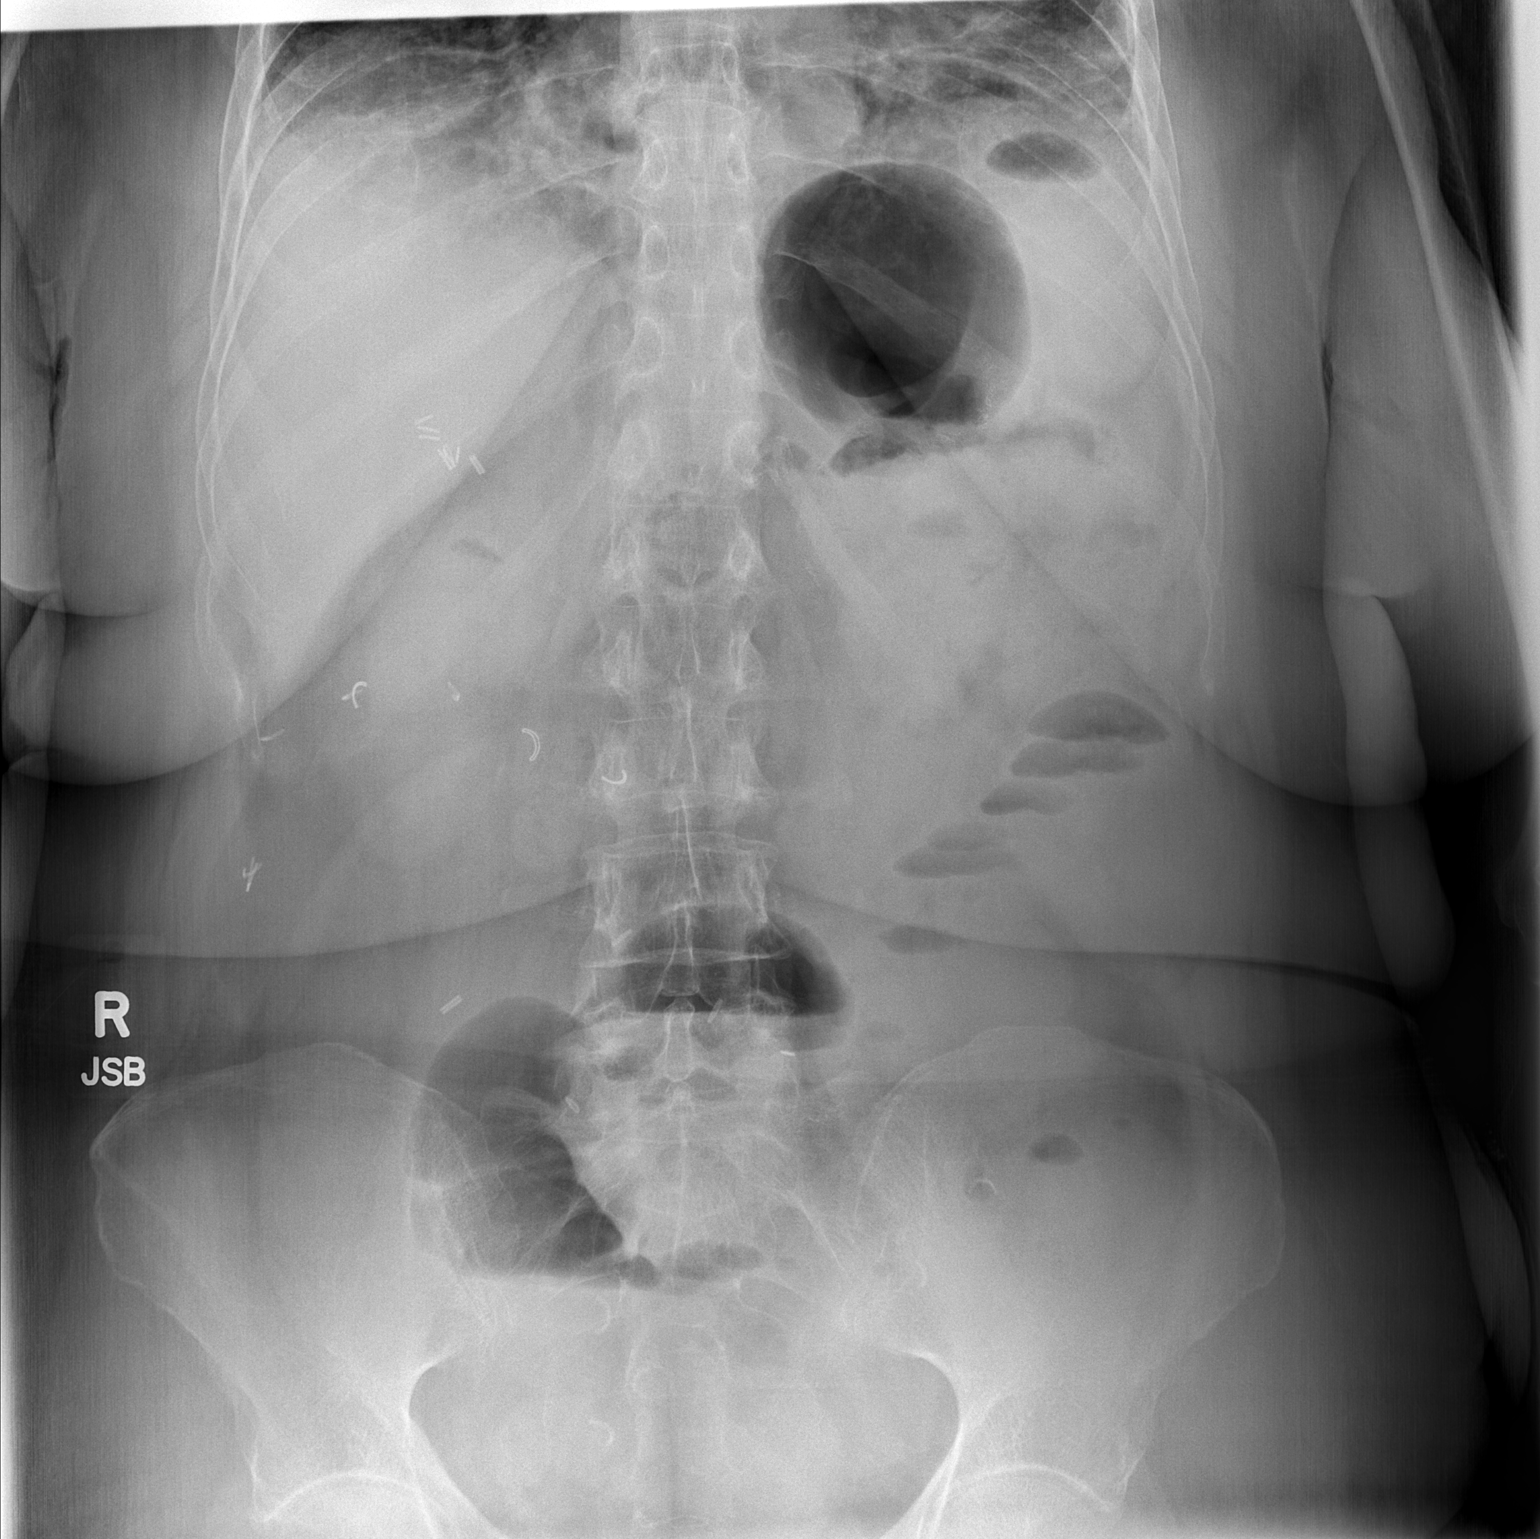

[t abdomen supine]
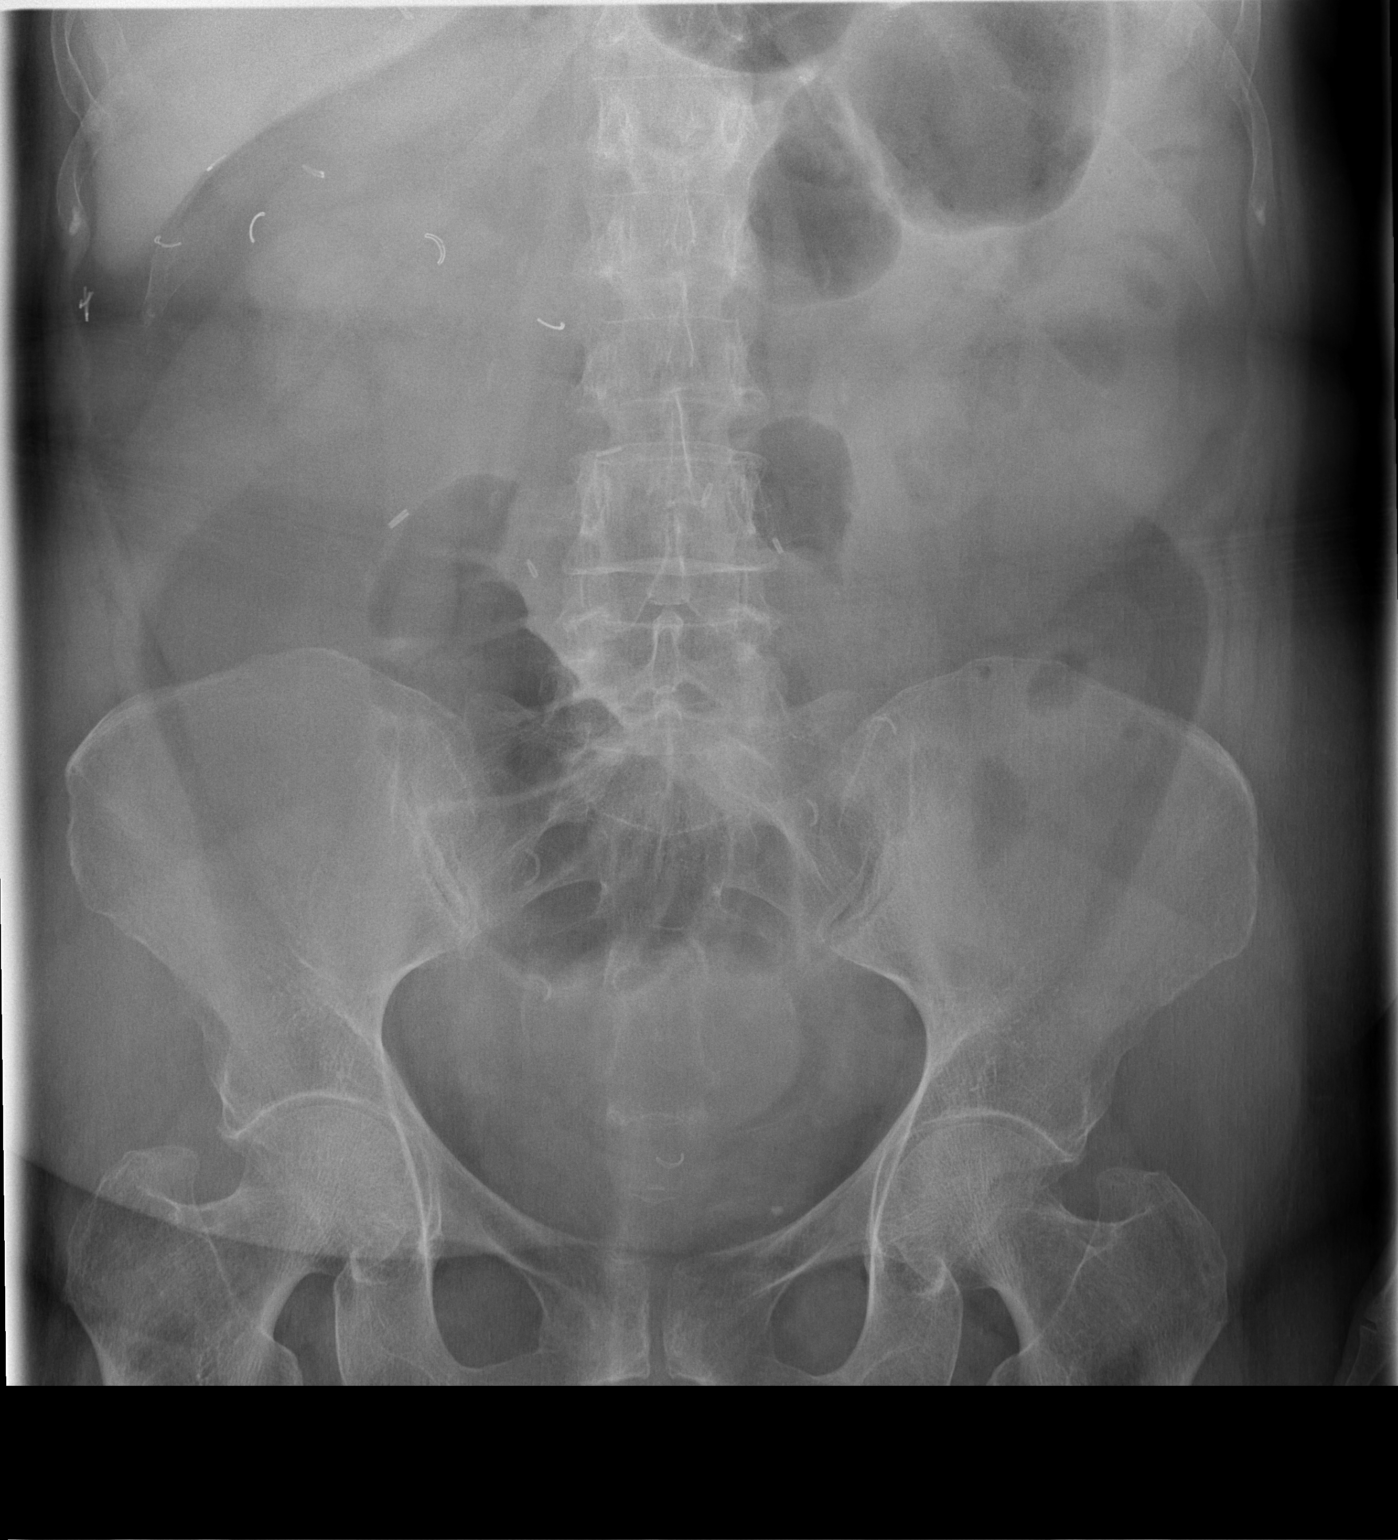

[2 of 2 positions shown; findings below may reference images not displayed]

FINDINGS: There is a prominent loop of small bowel in the central
lower abdomen which is unchanged from prior and measures 7 cm .
There are several air-fluid levels in the left lower quadrant small
bowel.  No evidence of intraperitoneal free air.  There is no gas
in the distal small bowel at the ileoanal anastomosis.
IMPRESSION: 1.  No significant change from prior.
2.  Small bowel ileus versus obstruction is similar to prior.

## 2010-05-29 ENCOUNTER — Inpatient Hospital Stay (HOSPITAL_COMMUNITY): Admission: EM | Admit: 2010-05-29 | Discharge: 2010-06-08 | Payer: Self-pay | Admitting: Emergency Medicine

## 2010-05-29 IMAGING — CR DG ABDOMEN ACUTE W/ 1V CHEST
3 series · 3 of 3 positions shown · non-contrast
Comparison: Abdomen series 05/14/2009, CT abdomen pelvis 05/02/2009
and chest x-ray 02/12/2009.  Recent clinical notes were reviewed.

CLINICAL DATA: Crohn's disease with abdominal pain, nausea and
vomiting.

ACUTE ABDOMEN SERIES (ABDOMEN 2 VIEW & CHEST 1 VIEW)

[w chest pa]
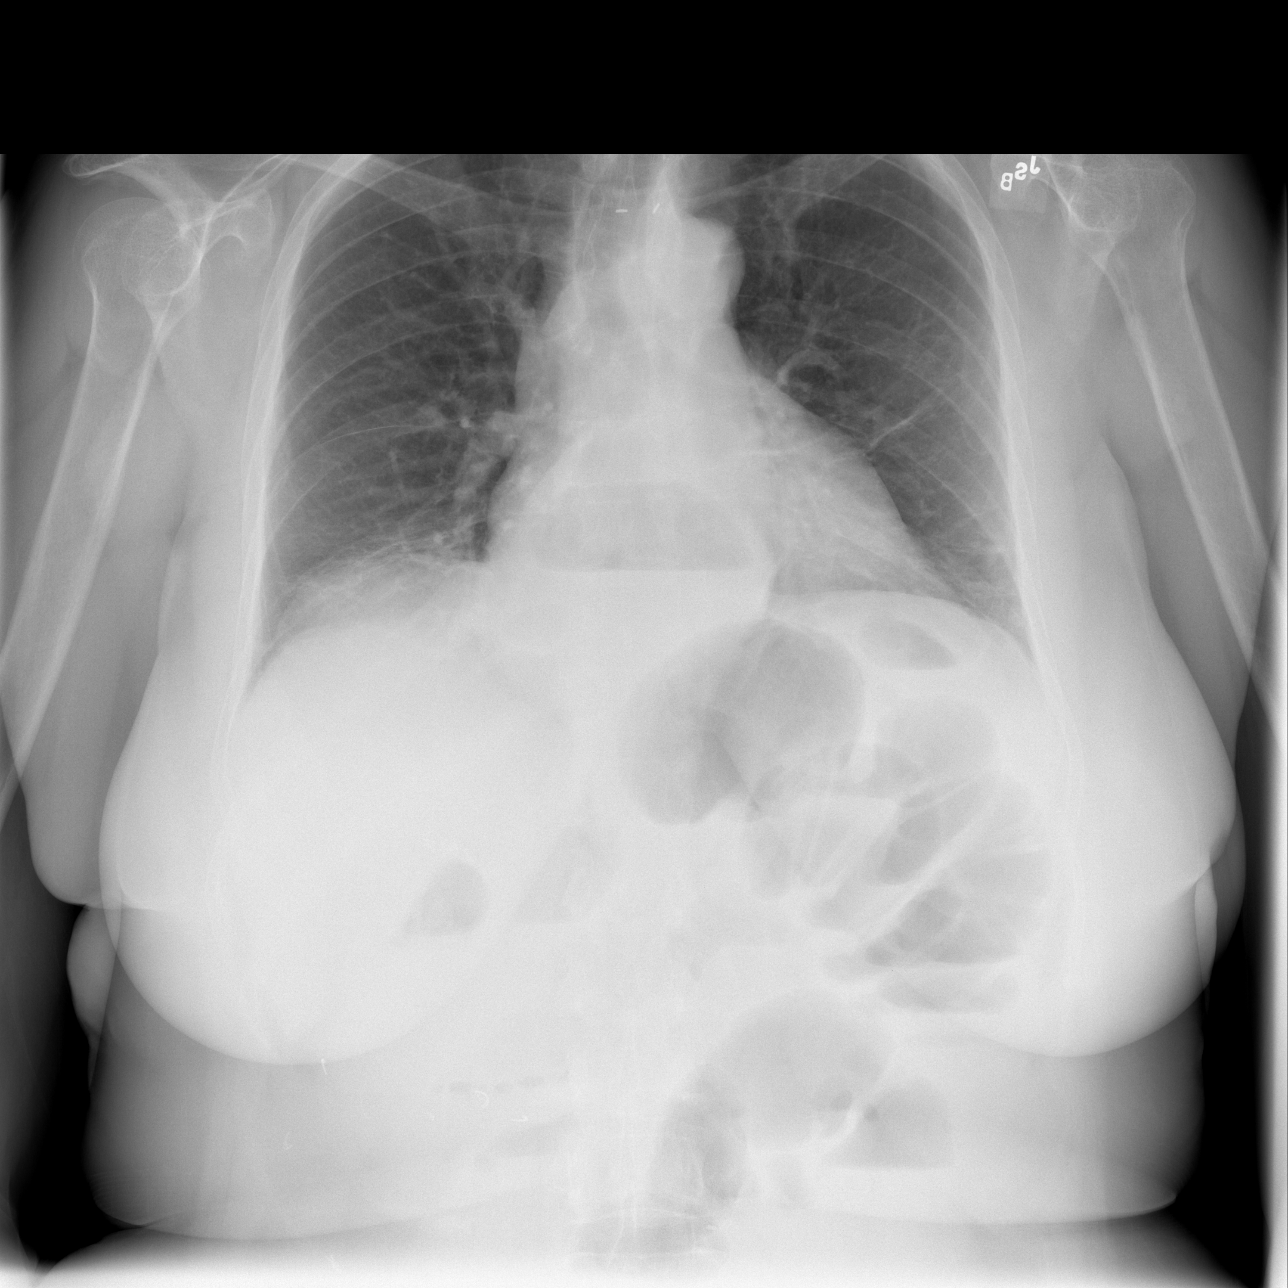

[w abdomen upright]
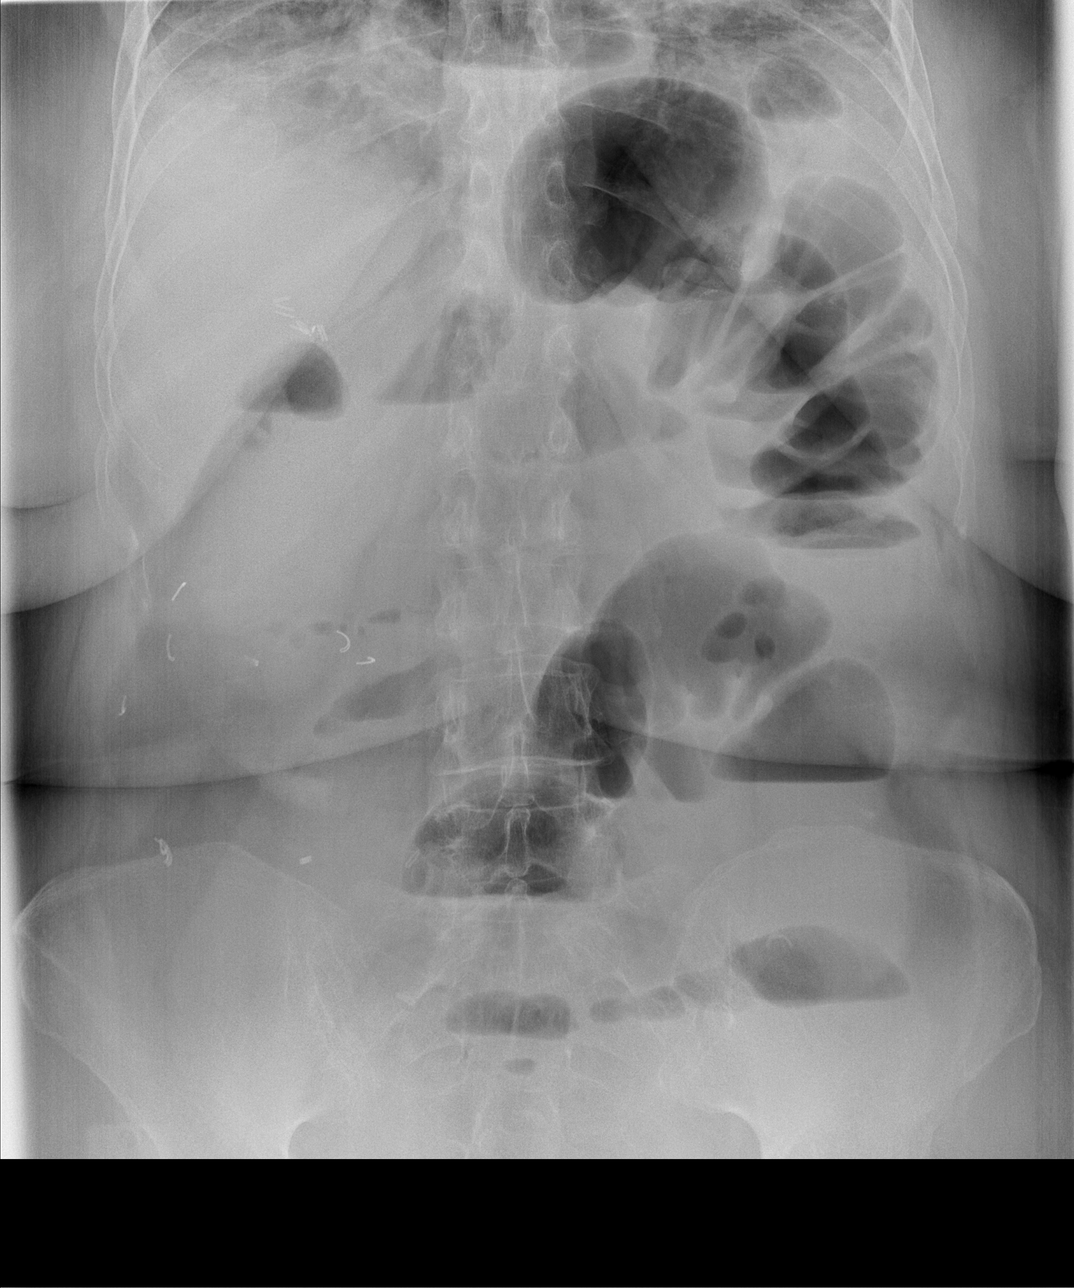

[t abdomen supine]
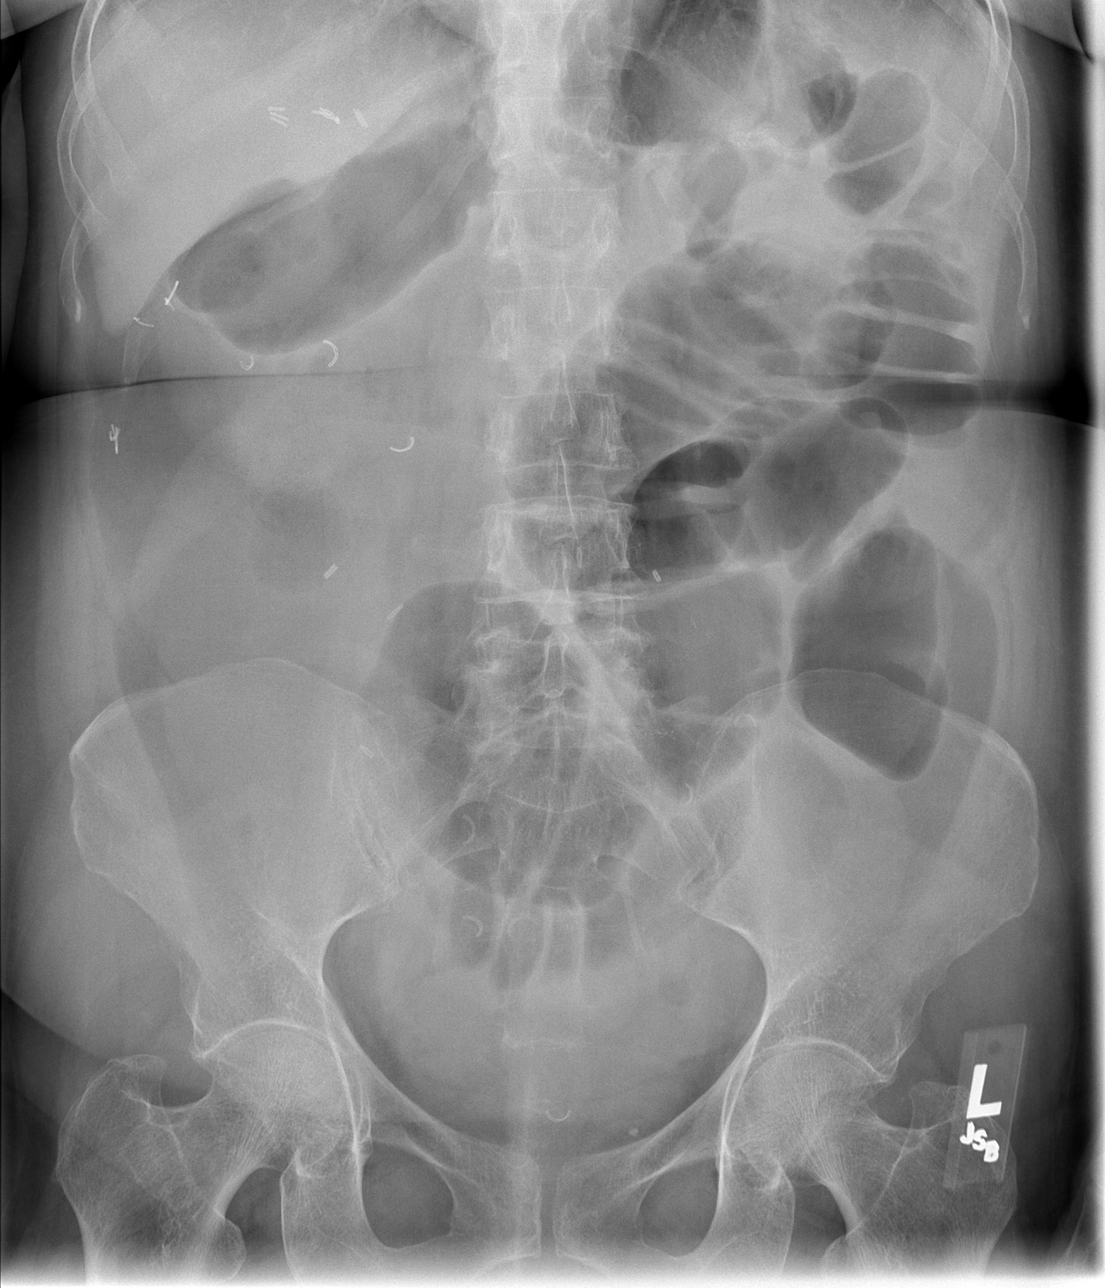

[3 of 3 positions shown; findings below may reference images not displayed]

FINDINGS: Frontal view of the chest shows midline trachea and
stable heart size.  Large hiatal hernia.  Lungs are somewhat low in
volume with bibasilar atelectasis and/or scarring.

Two views of the abdomen show postoperative changes.  Air and fluid
are seen in somewhat dilated bowel.  The patient has undergone
multiple bowel resections, per review of clinical notes, making
differentiation between small bowel and colon somewhat difficult.
IMPRESSION: Dilated bowel with air-fluid levels, which may be due to
obstruction. Please see above.

## 2010-05-31 ENCOUNTER — Ambulatory Visit: Payer: Self-pay | Admitting: Internal Medicine

## 2010-05-31 IMAGING — CR DG ABDOMEN 1V
1 series · 1 of 1 positions shown · non-contrast
Comparison: Acute abdomen series 05/25/2009 and two-view abdomen x-
rays 05/14/2009 and 05/03/2009.

CLINICAL DATA: History of Crohn's disease who presented this
admission with a small bowel obstruction.  Follow-up.

ABDOMEN - 1 VIEW 05/17/2009:

[t abdomen supine]
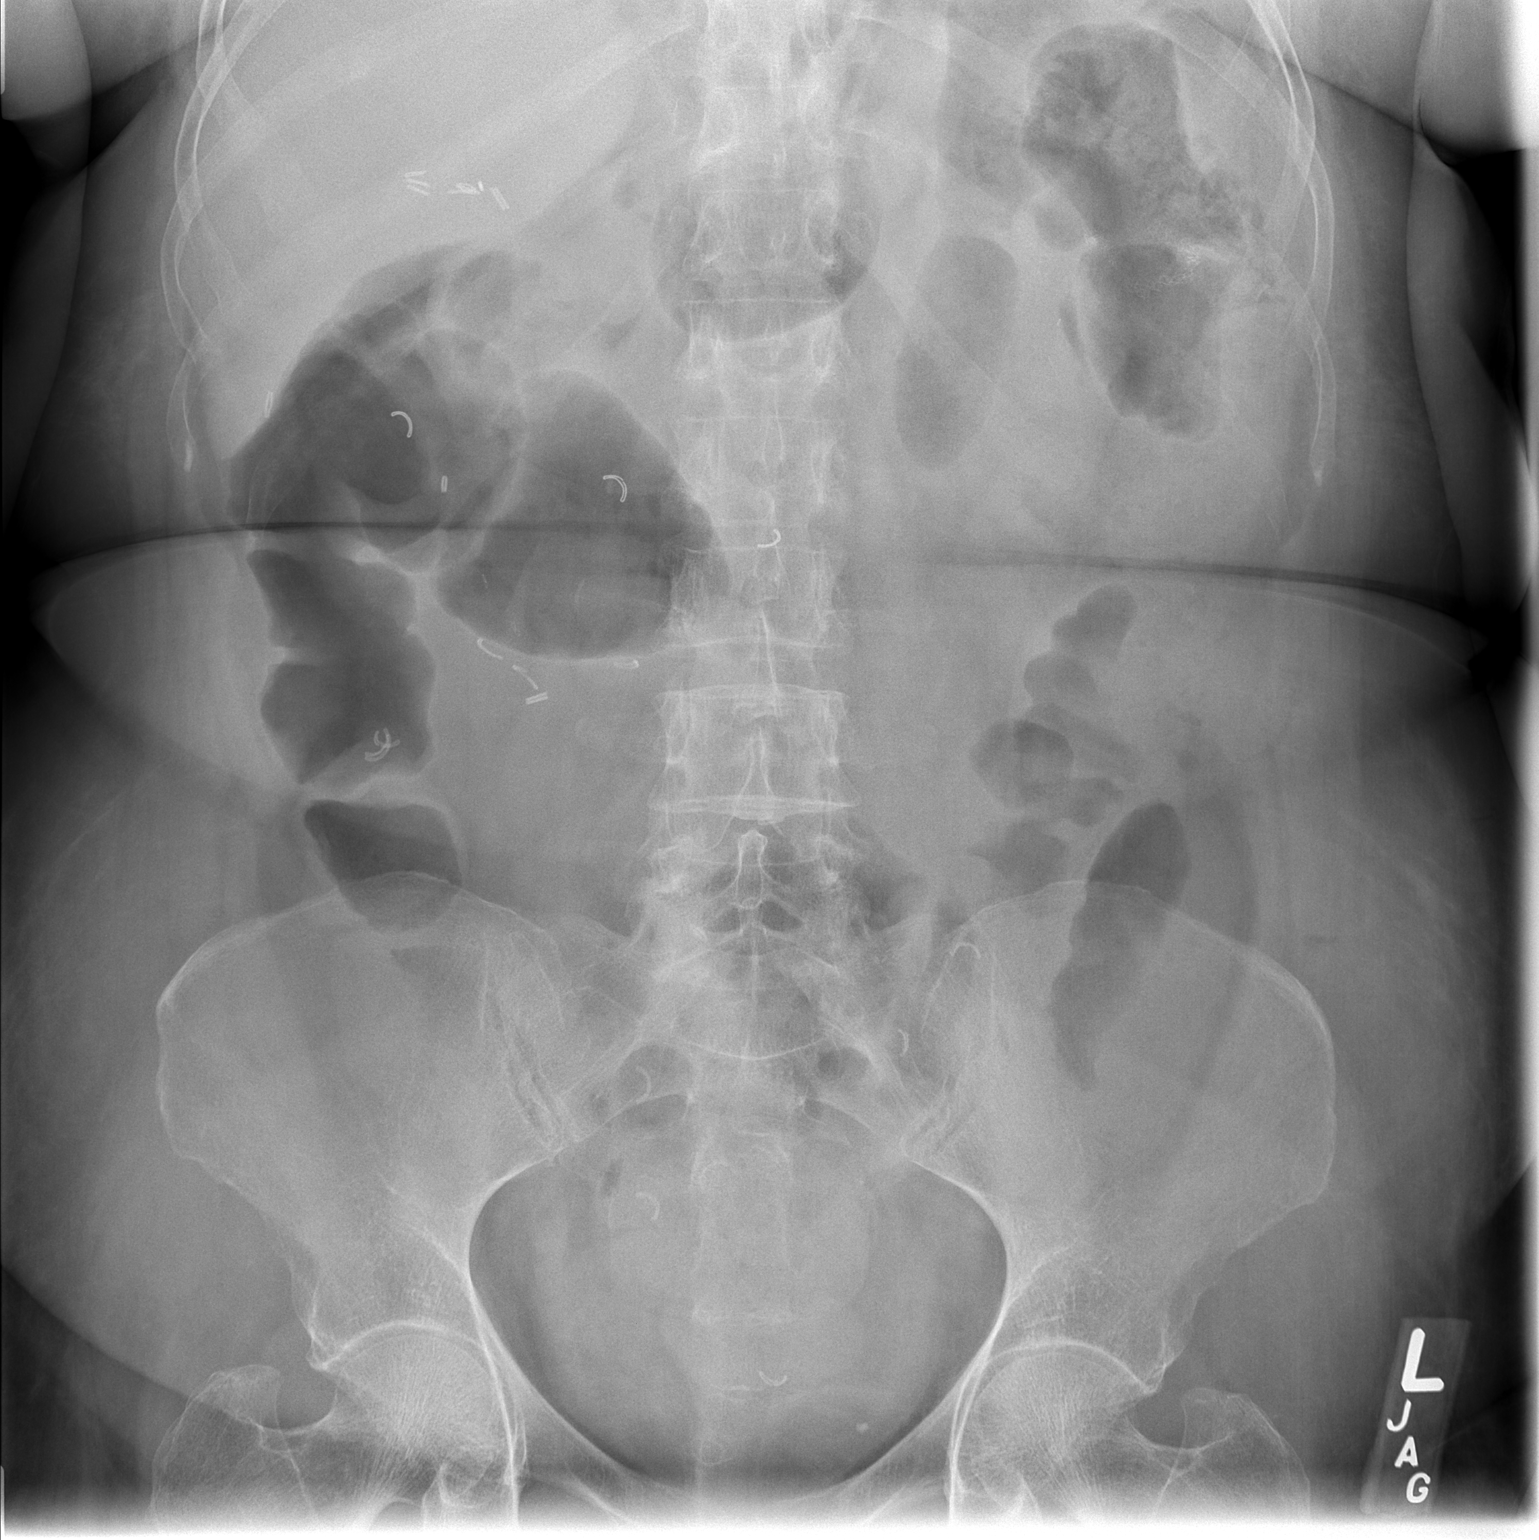

[1 of 1 positions shown; findings below may reference images not displayed]

FINDINGS: Significant interval improvement in the dilated jejunum
in the left upper quadrant since the examination 2 days ago.  Gas
within normal caliber jejunum currently.  Gas in normal caliber
colon.  No evidence of free air on the supine image.
IMPRESSION: Resolution of small bowel obstruction.  Unremarkable bowel gas
pattern currently.

## 2010-06-04 ENCOUNTER — Ambulatory Visit: Payer: Self-pay | Admitting: Internal Medicine

## 2010-07-22 IMAGING — CR DG CHEST 2V
2 series · 2 of 2 positions shown · non-contrast
Comparison: 02/12/2009

CLINICAL DATA: Abdominal pain and Crohn's flare-up.

CHEST - 2 VIEW

[w chest pa]
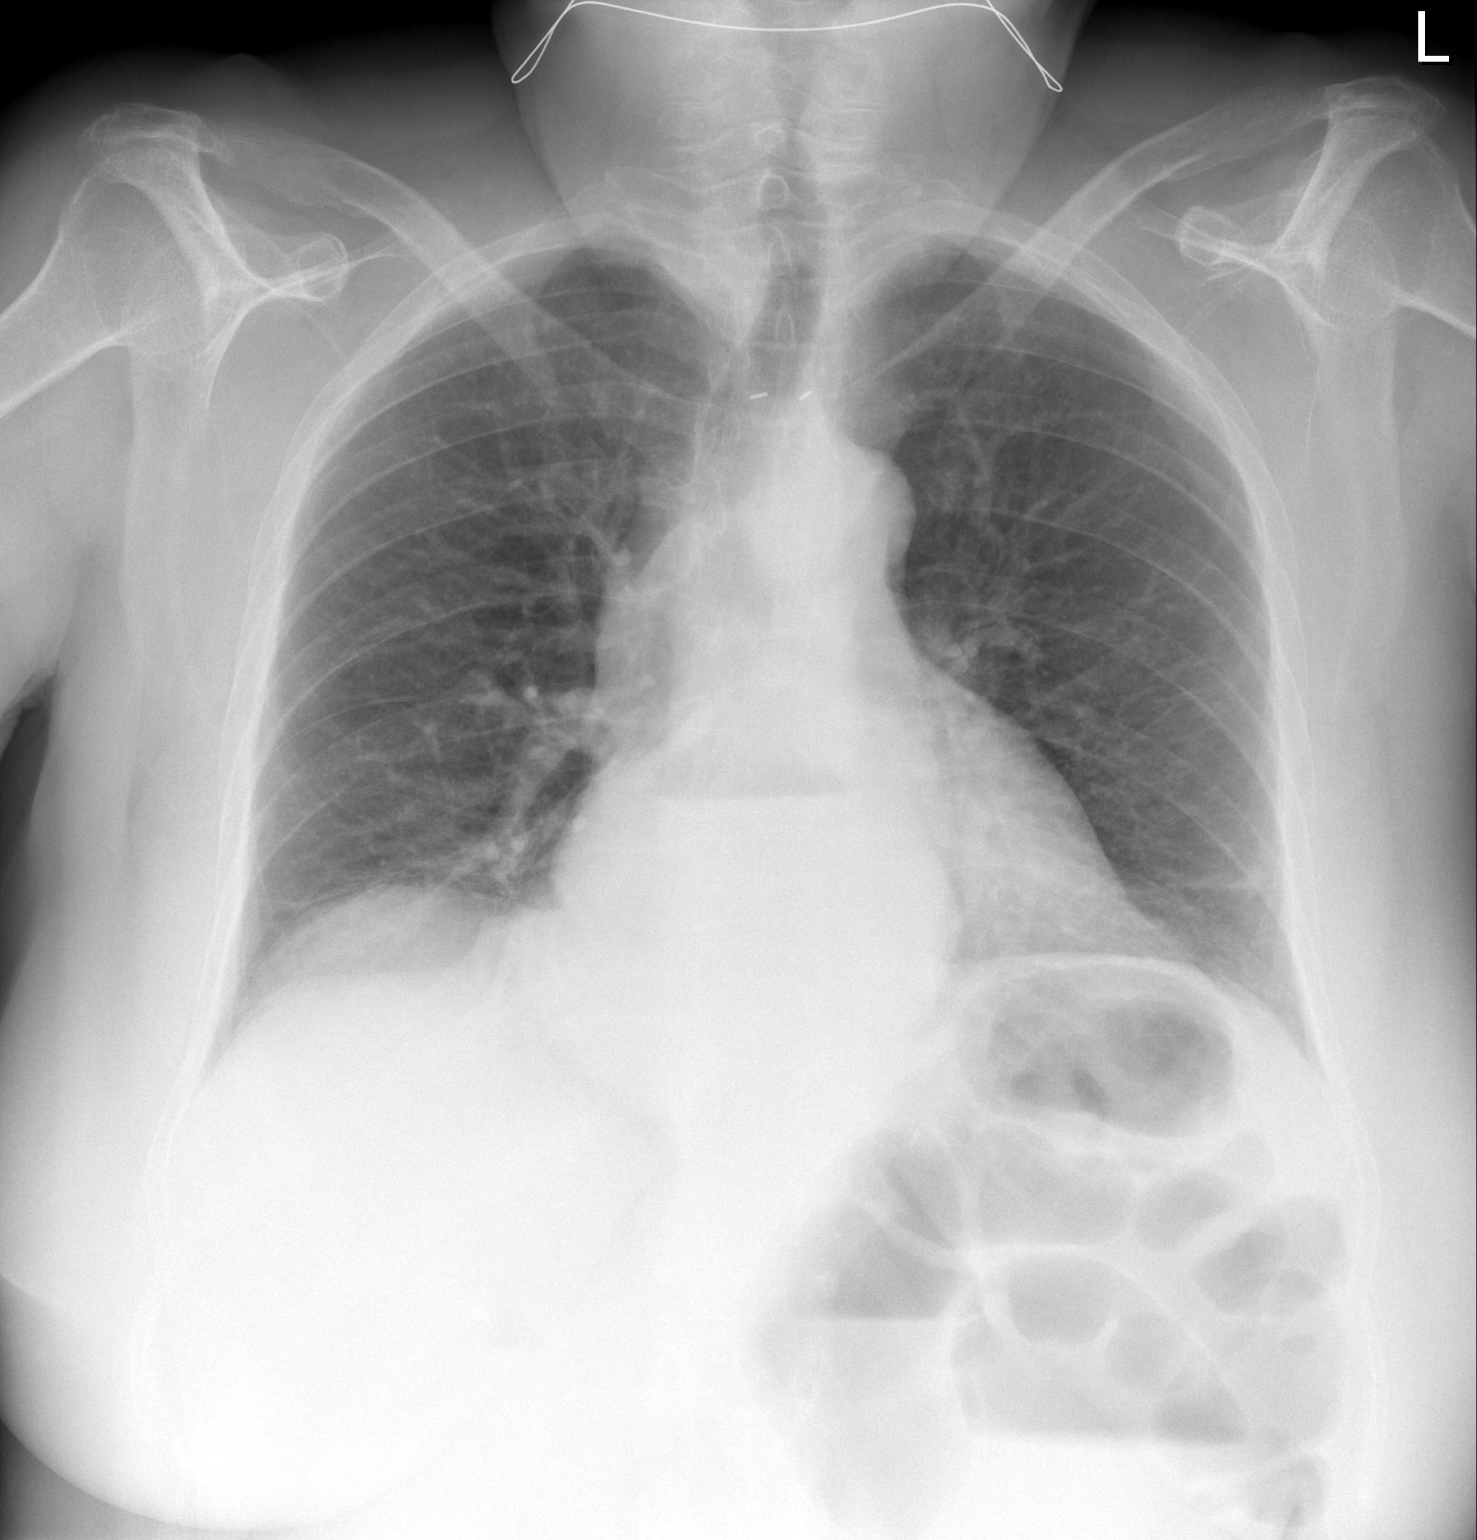

[w chest lat]
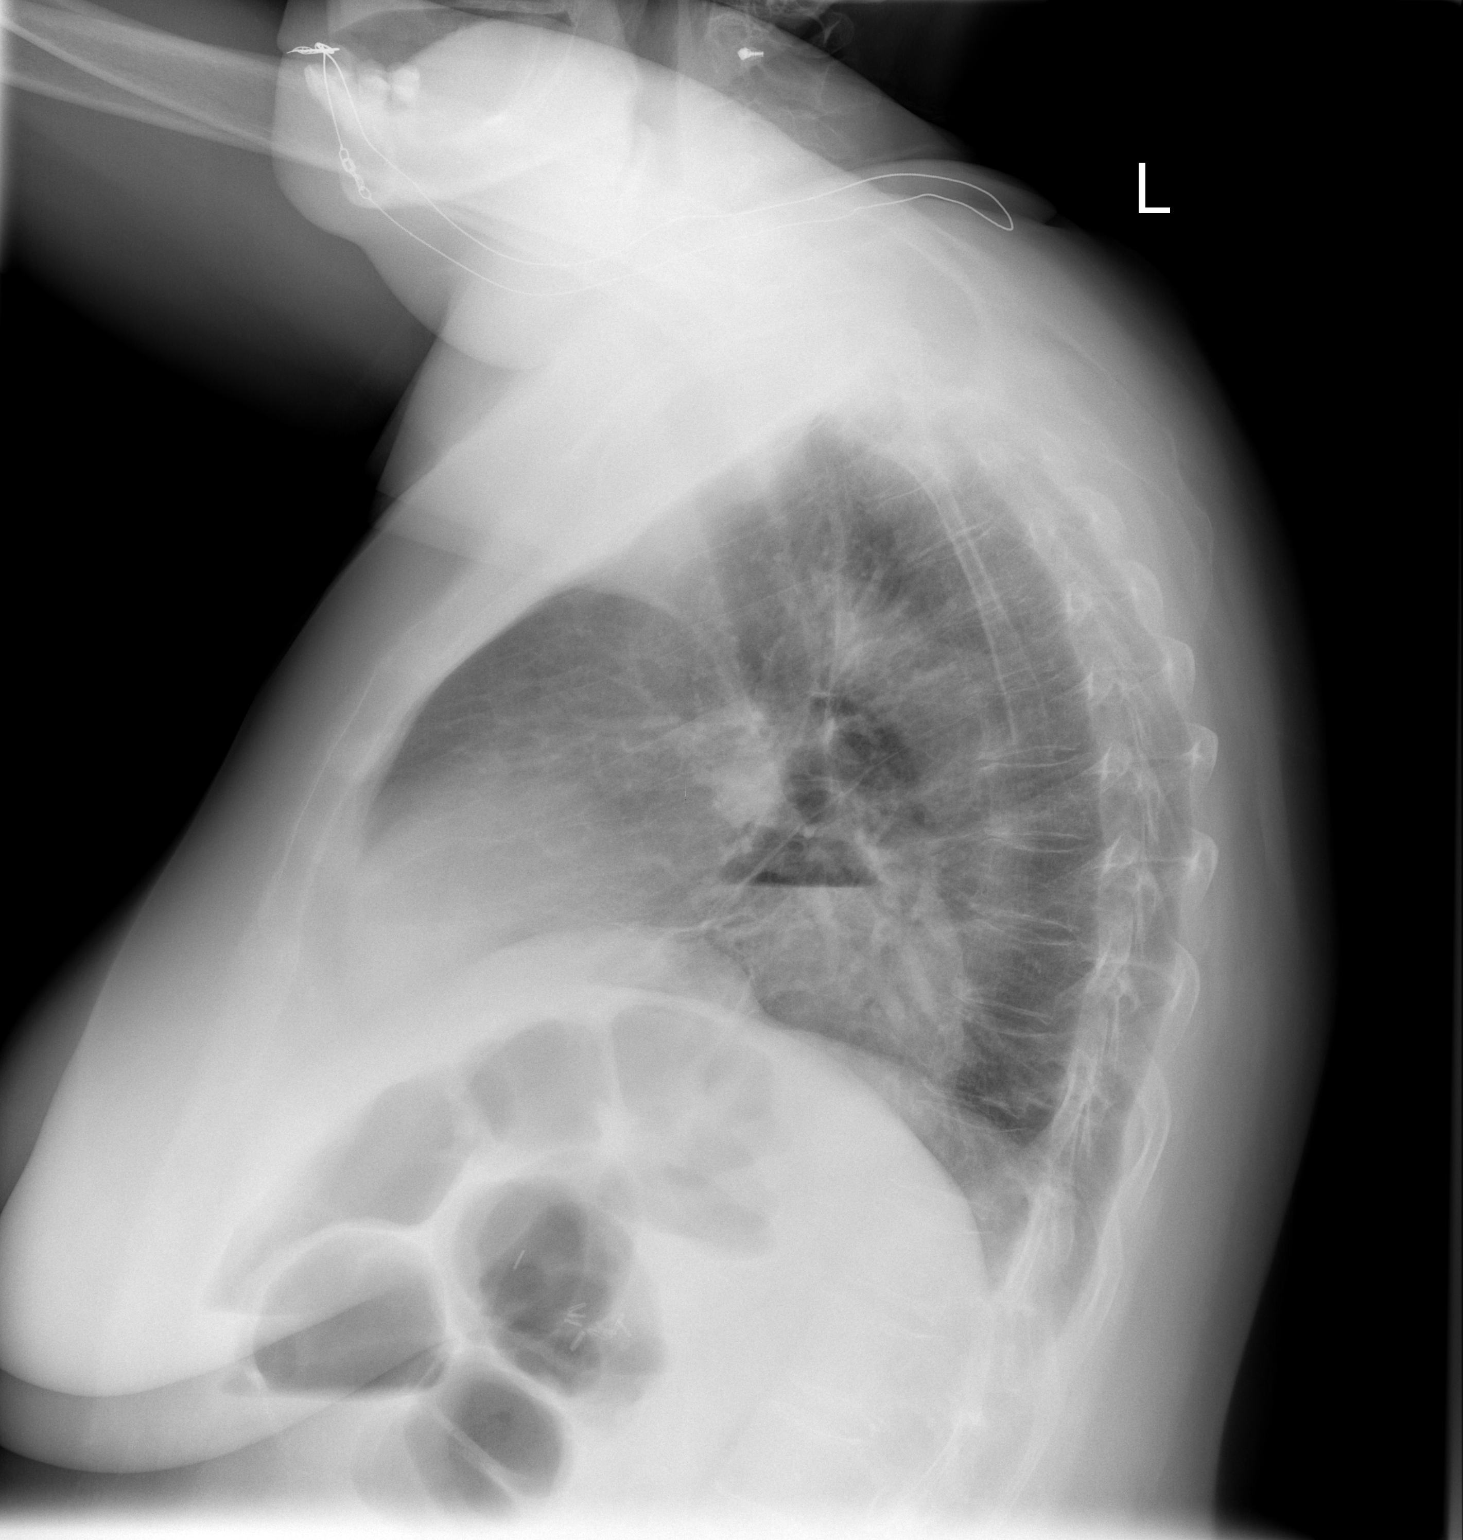

[2 of 2 positions shown; findings below may reference images not displayed]

FINDINGS: Two views of the chest demonstrate a large hiatal hernia
with an air-fluid level.  Again noted are linear densities in the
lung bases suggestive for atelectasis and/or scarring.  Stable
surgical clips near the thoracic inlet.  Cardiac silhouette is
stable.  There are gas-filled loops of bowel in the left upper
quadrant of the abdomen.
IMPRESSION: No acute chest findings.

Large hiatal hernia.

Gas-filled loops of bowel in the left upper quadrant of the
abdomen.

## 2010-07-22 IMAGING — CT CT ABDOMEN W/ CM
1 of 3 series · 14 of 32 positions shown, 18 images · IV contrast (APPLIED)
Comparison: Plain films of the abdomen 05/17/2009 and CT abdomen
and pelvis 05/02/2009 and 02/11/2009.

CT ABDOMEN

CLINICAL DATA: Abdominal pain in patient with Crohn's disease.

CT ABDOMEN AND PELVIS WITH CONTRAST
TECHNIQUE: Multidetector CT imaging of the abdomen and pelvis was
performed using the standard protocol following bolus
administration of intravenous contrast.
Contrast: 100 ml 1mnipaque-TWW.

[Series 2: abd/pelv with 5.0 b31f st · axial · 0.72mm/px · z∈[-548,-108]mm · 14 of 98 slices shown, 18 images]
[im 5/98  soft-tissue]
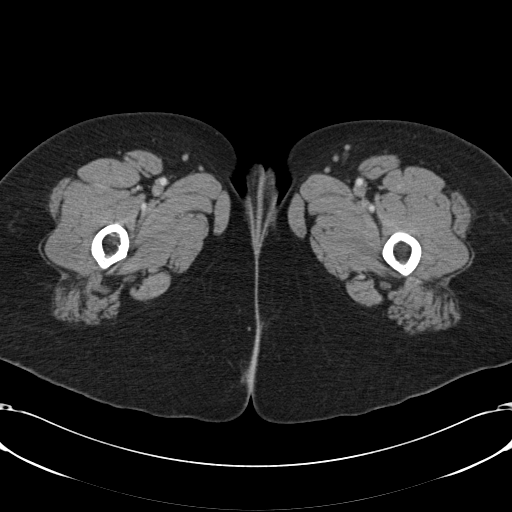
[im 5/98  bone]
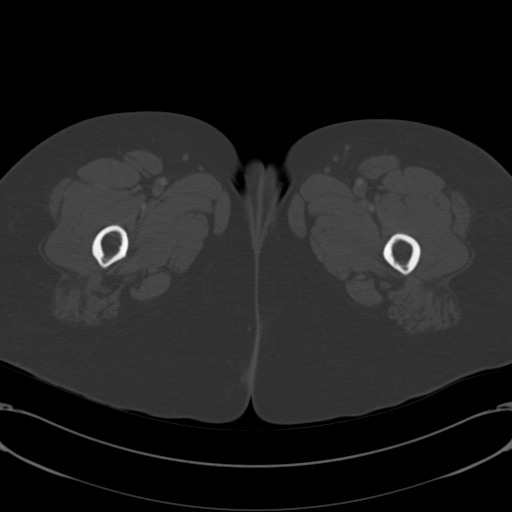
[im 14/98  soft-tissue]
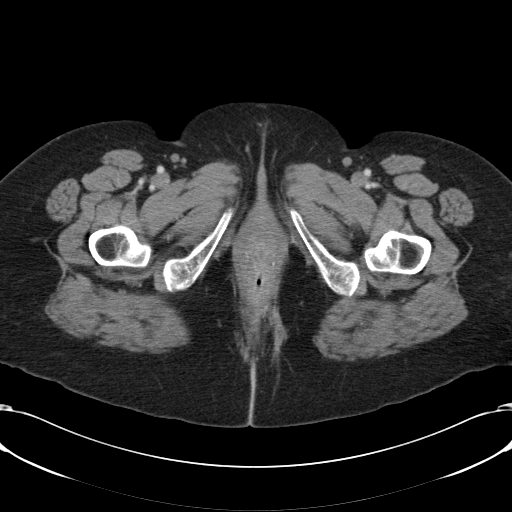
[im 23/98  soft-tissue]
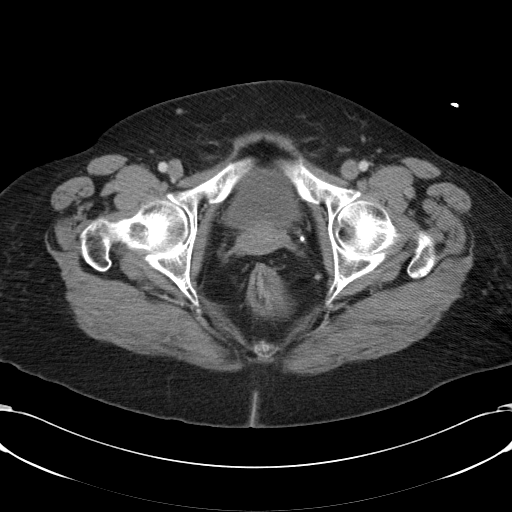
[im 31/98  soft-tissue]
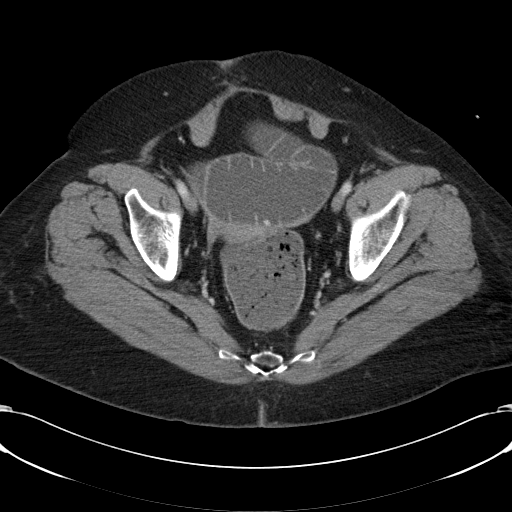
[im 36/98  soft-tissue]
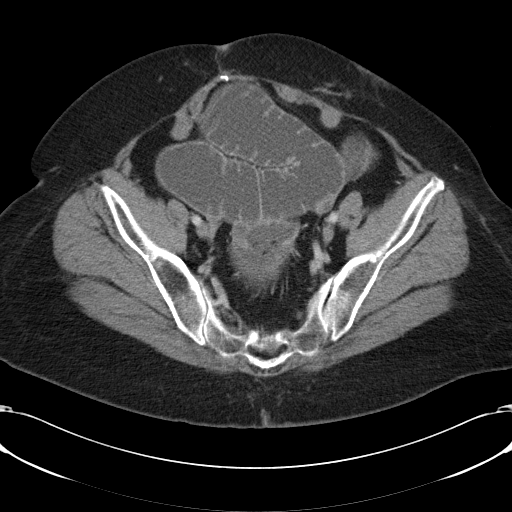
[im 45/98  soft-tissue]
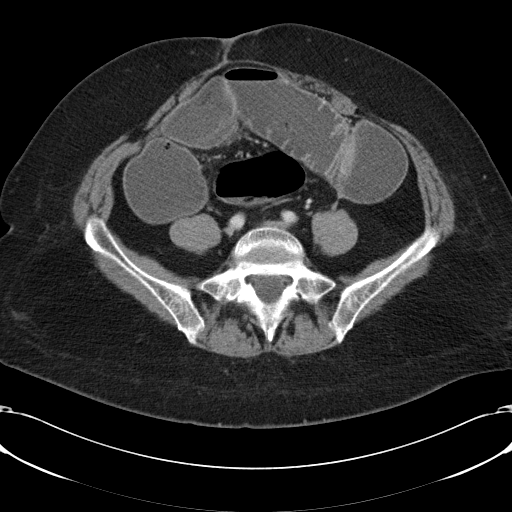
[im 53/98  soft-tissue]
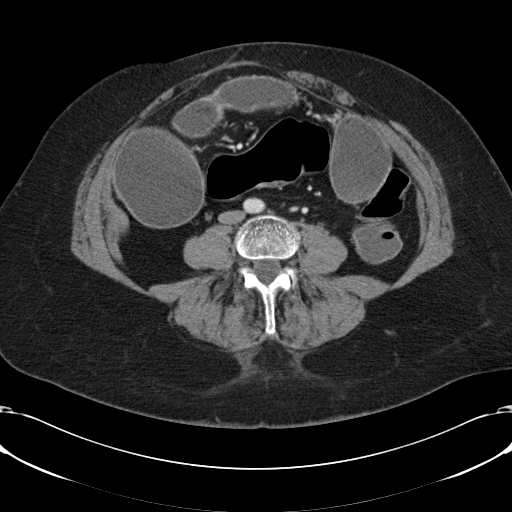
[im 62/98  soft-tissue]
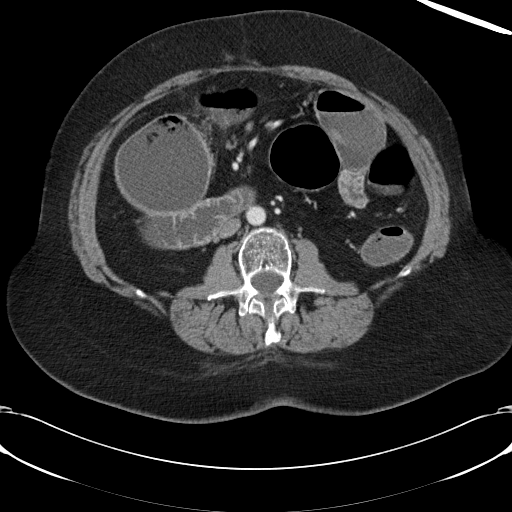
[im 67/98  soft-tissue]
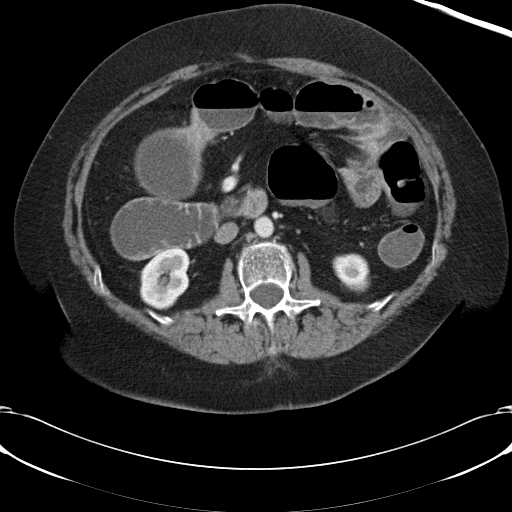
[im 67/98  bone]
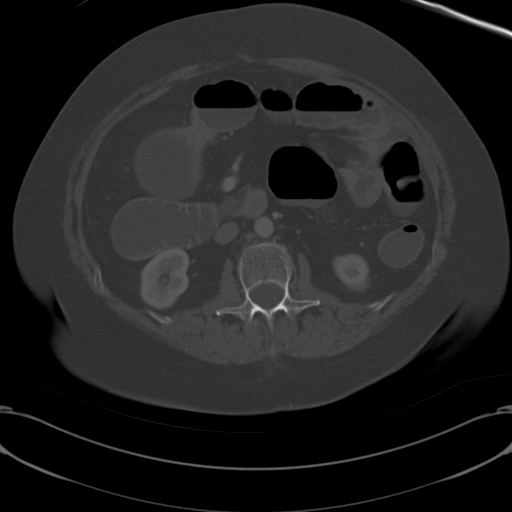
[im 75/98  soft-tissue]
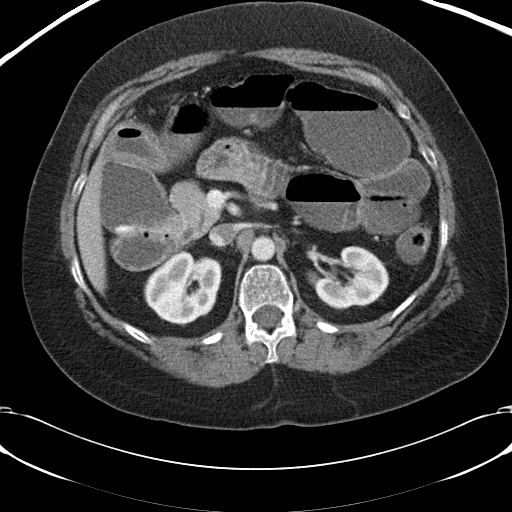
[im 80/98  lung]
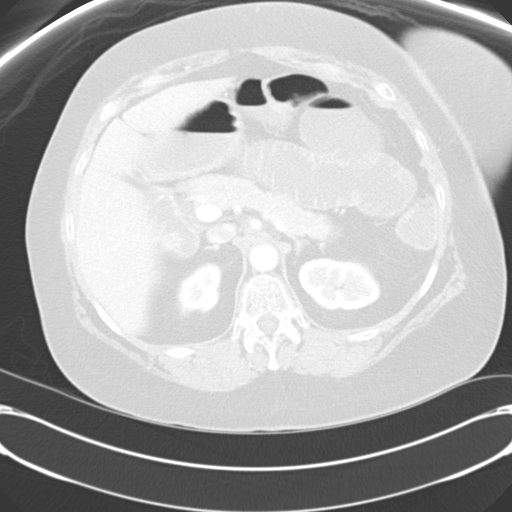
[im 84/98  soft-tissue]
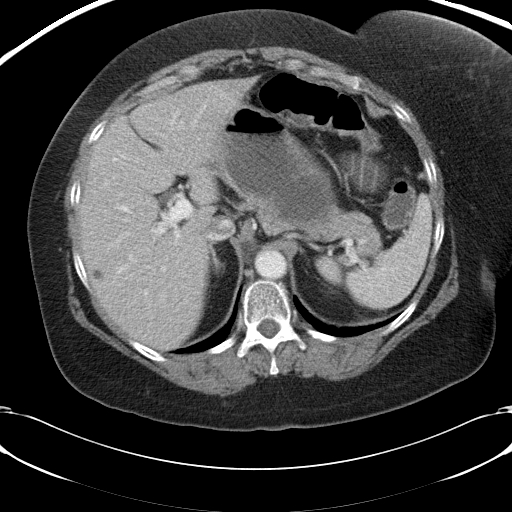
[im 84/98  lung]
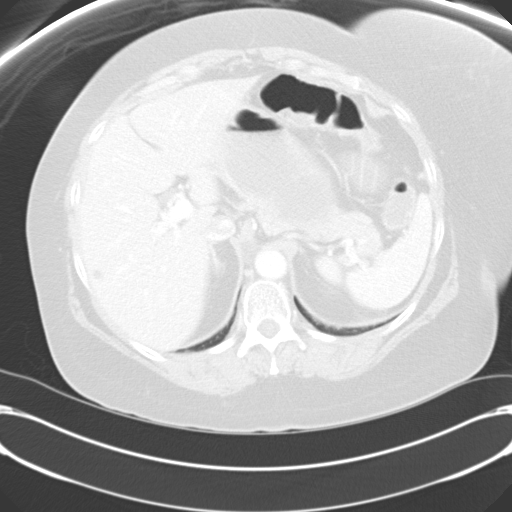
[im 89/98  lung]
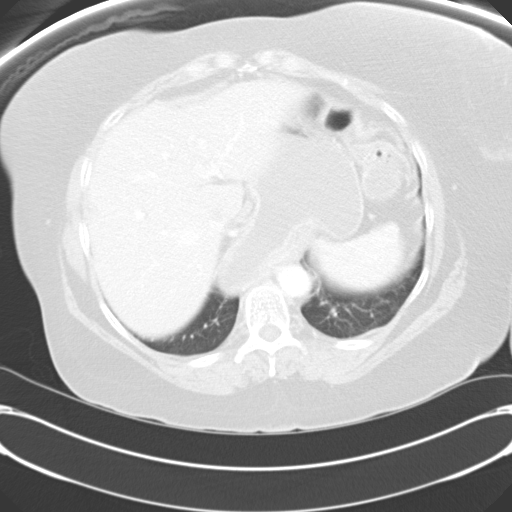
[im 93/98  soft-tissue]
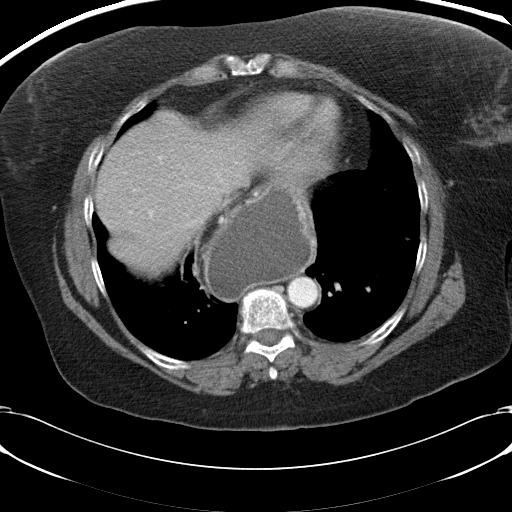
[im 93/98  lung]
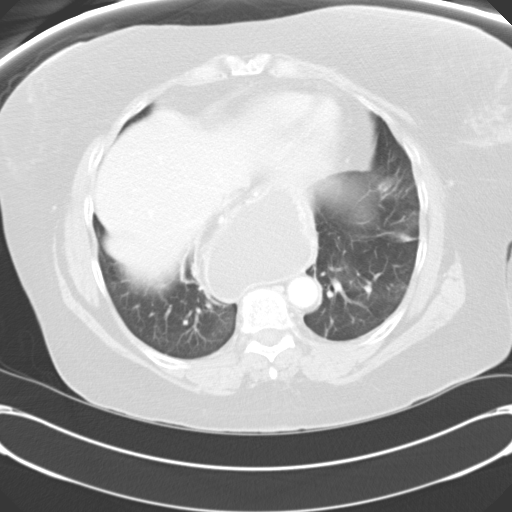

[14 of 32 positions shown; findings below may reference images not displayed]

FINDINGS: As on the prior study, the patient has a large hiatal
hernia.  There is some scattered atelectasis in the lung bases
which are otherwise unremarkable.  No pleural or pericardial
effusion.

Surgical anastomosis is noted in the left upper quadrant of the
abdomen in this patient who is status post subtotal colectomy.
There is marked dilatation of small bowel diffusely with multiple
air-fluid levels present.  Focal area of narrowing of small bowel
is seen in the right upper quadrant of the abdomen on image 32
which has a similar appearance to the prior study from 02/11/2009.
Small bowel loop immediately proximal to this area of narrowing is
markedly dilated with small bowel feces sign identified.  There is
a second area of narrowing of small bowel in the left upper
quadrant of the abdomen with an angulated loop seen.  No abscess or
perforation is identified.

The patient is status post cholecystectomy.  Small cyst in the
right hepatic lobe is unchanged.  The adrenal glands, pancreas,
biliary tree, spleen and kidneys are unremarkable.  No focal bony
abnormality is identified.
IMPRESSION: 1.  Findings consistent with small bowel obstruction.  Two
potential areas of narrowing are identified as detailed above with
with the most likely in the right upper quadrant and a second in
the left upper quadrant.  Right upper quadrant narrowing appears
similar to that seen on prior study. No abscess or perforation.
2.  Large hiatal hernia

CT PELVIS
FINDINGS: There is no pelvic fluid or lymphadenopathy.  Small
enhancing lesion the right side the uterus is compatible with a
small fibroid.  Uterus and adnexa otherwise unremarkable.  No focal
bony abnormality.
IMPRESSION: No acute finding in the pelvis.

## 2010-07-23 IMAGING — CR DG ABDOMEN 2V
2 series · 2 of 2 positions shown · non-contrast
Comparison: CT 07/08/2009

CLINICAL DATA: Crohn disease.  Bowel obstruction.

ABDOMEN - 2 VIEW

[w abdomen upright]
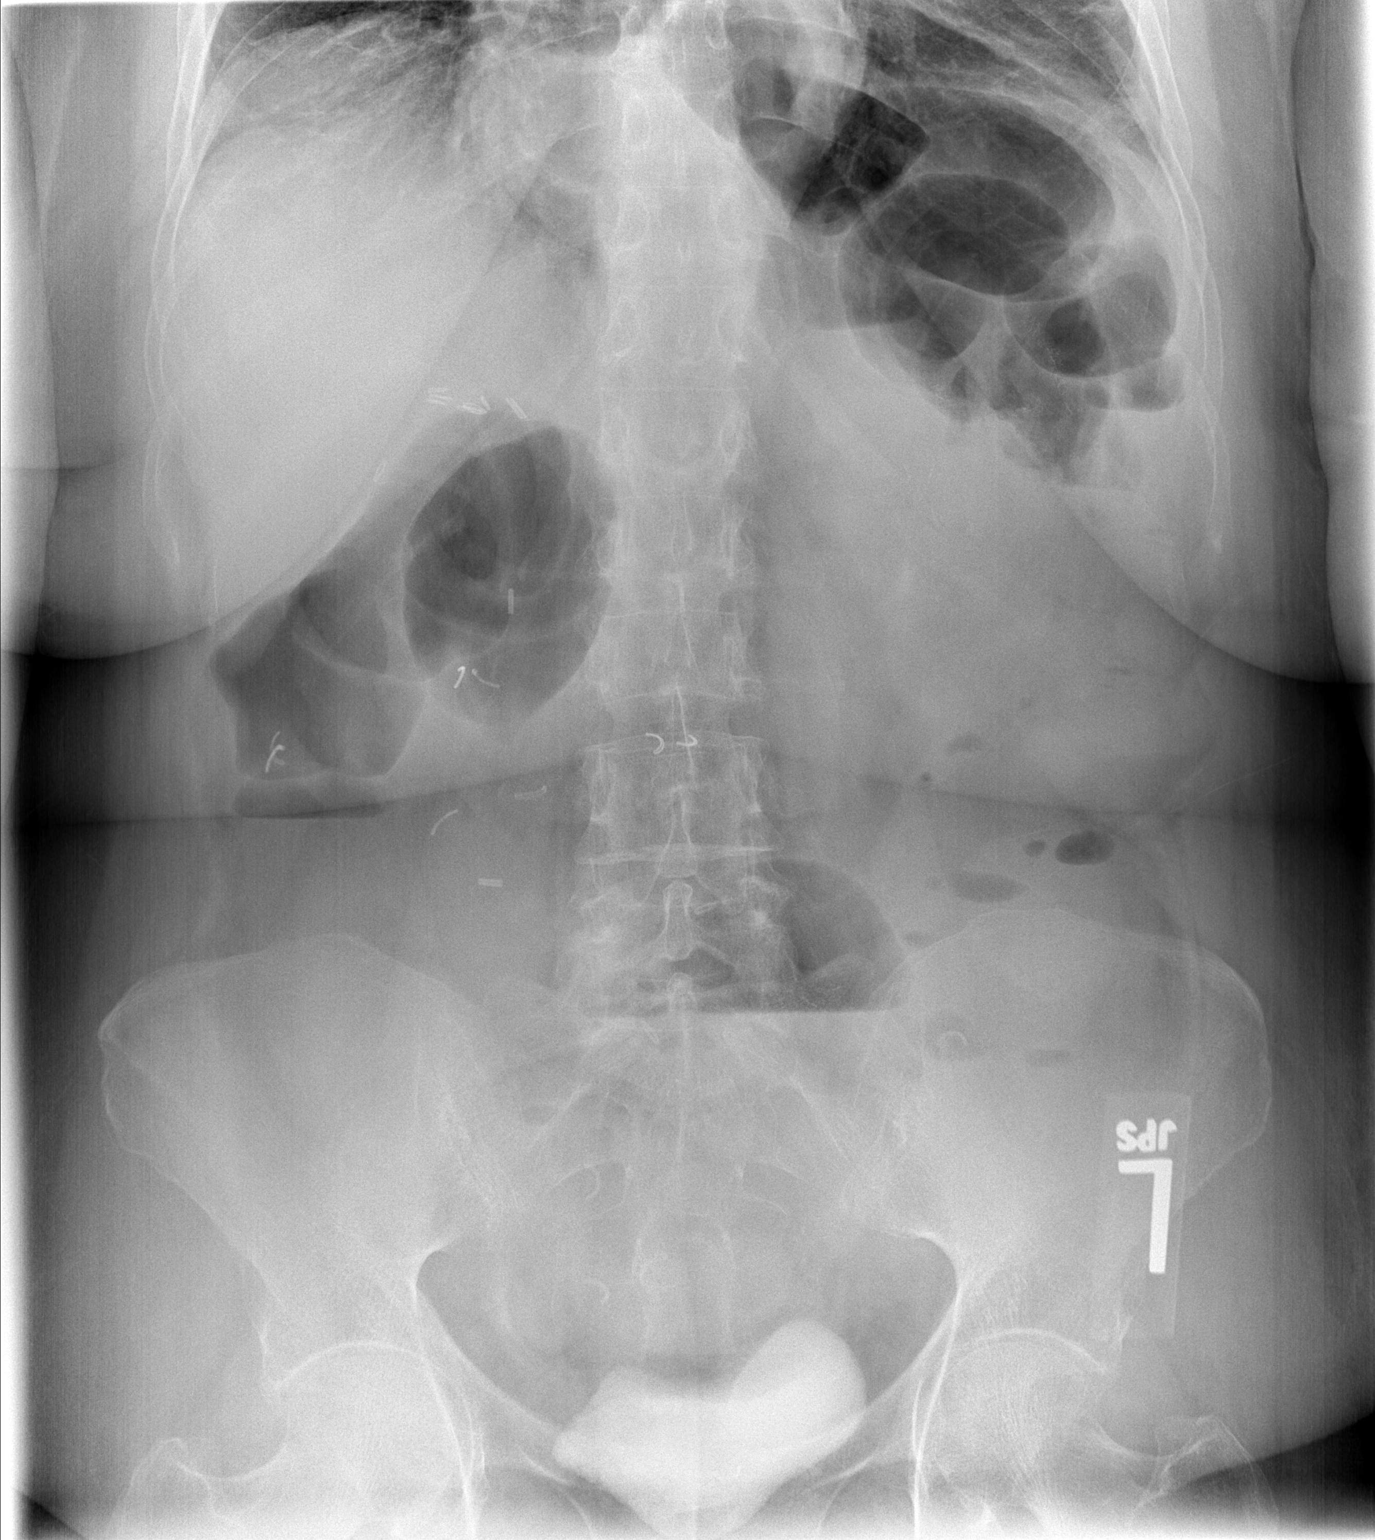

[t abdomen supine]
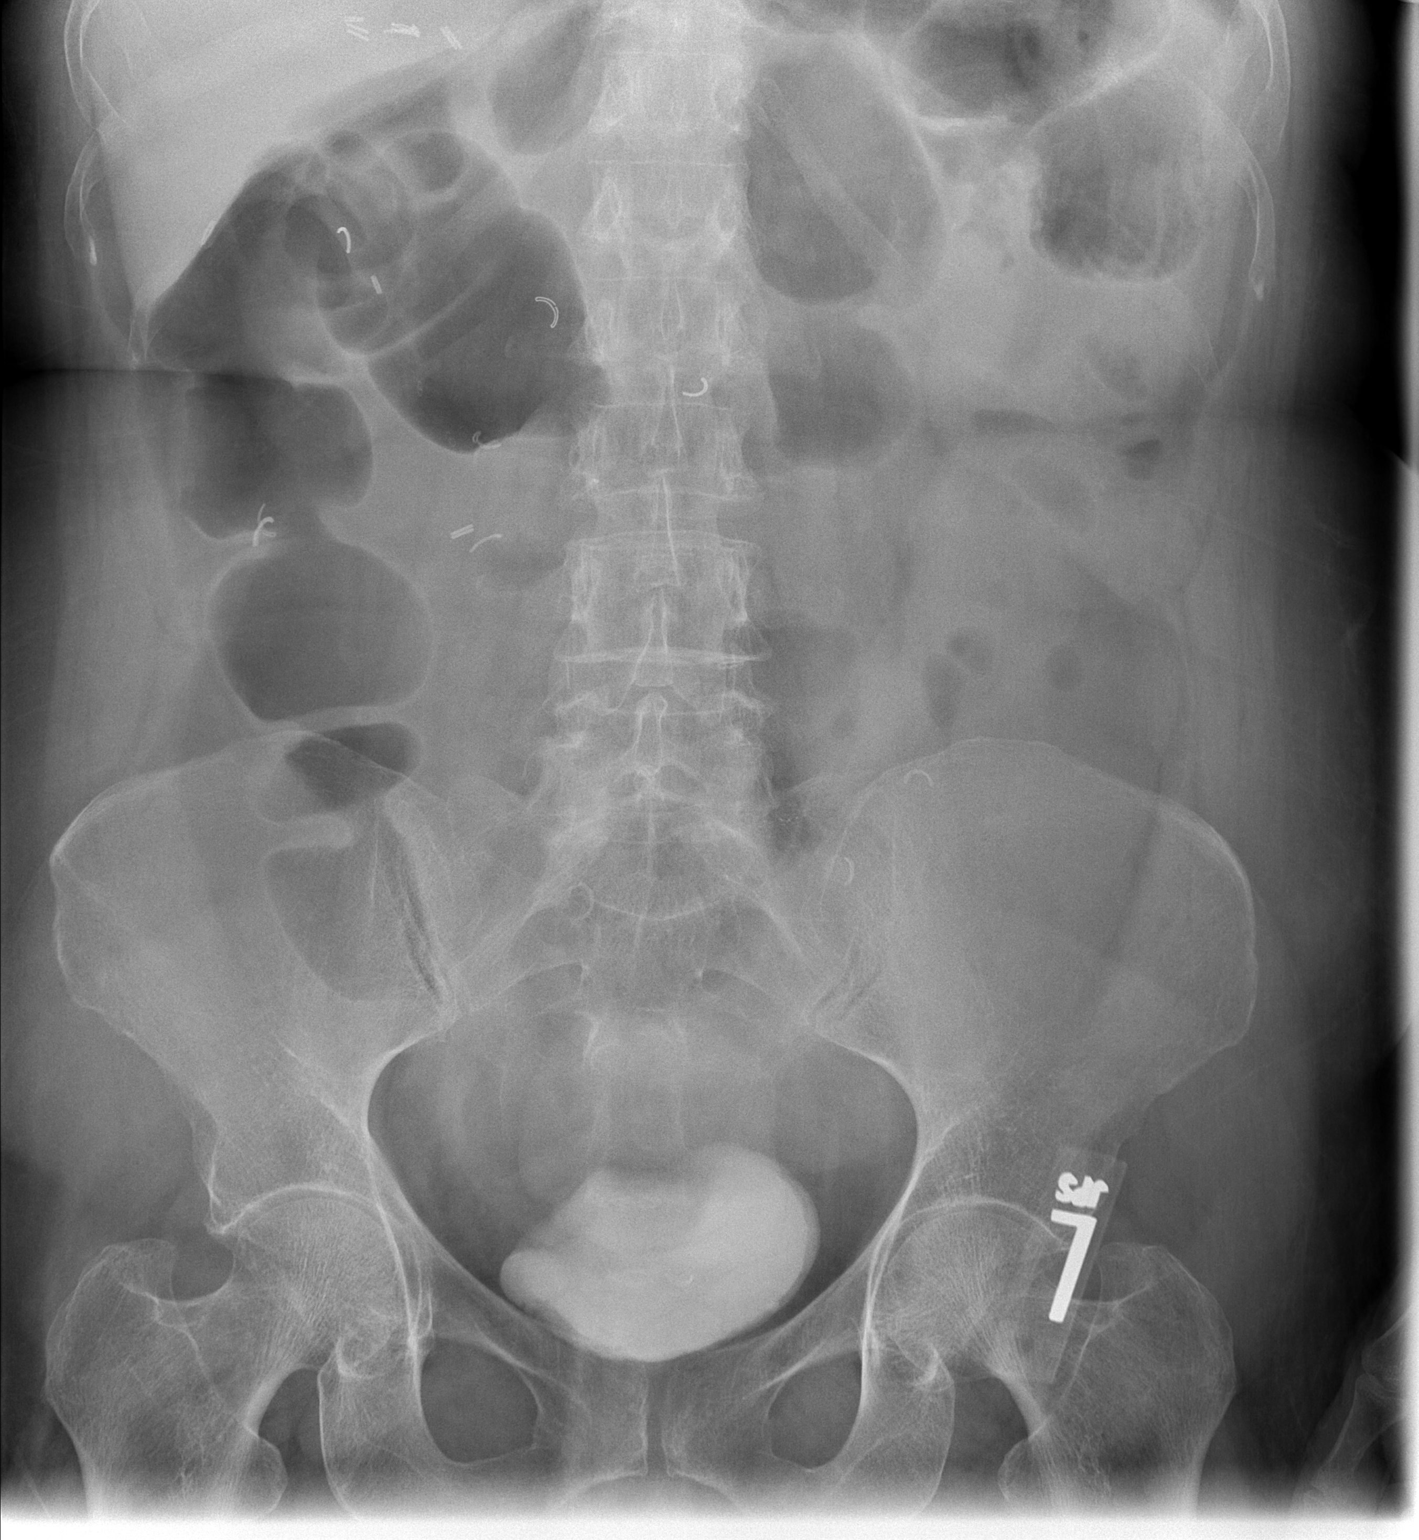

[2 of 2 positions shown; findings below may reference images not displayed]

FINDINGS: Distended loops of bowel, most likely small bowel with
air-fluid level in a lower abdominal bowel loops are findings
consistent with bowel obstruction.  Contrast material is noted in
the bladder.  No extraluminal gas.
IMPRESSION: Findings compatible with small bowel obstruction.

## 2010-07-24 IMAGING — CR DG ABDOMEN 1V
1 series · 1 of 1 positions shown · non-contrast
Comparison: Abdomen films of [DATE] and 05/17/2009

CLINICAL DATA: Follow up small bowel obstruction

ABDOMEN - 1 VIEW

[t abdomen supine]
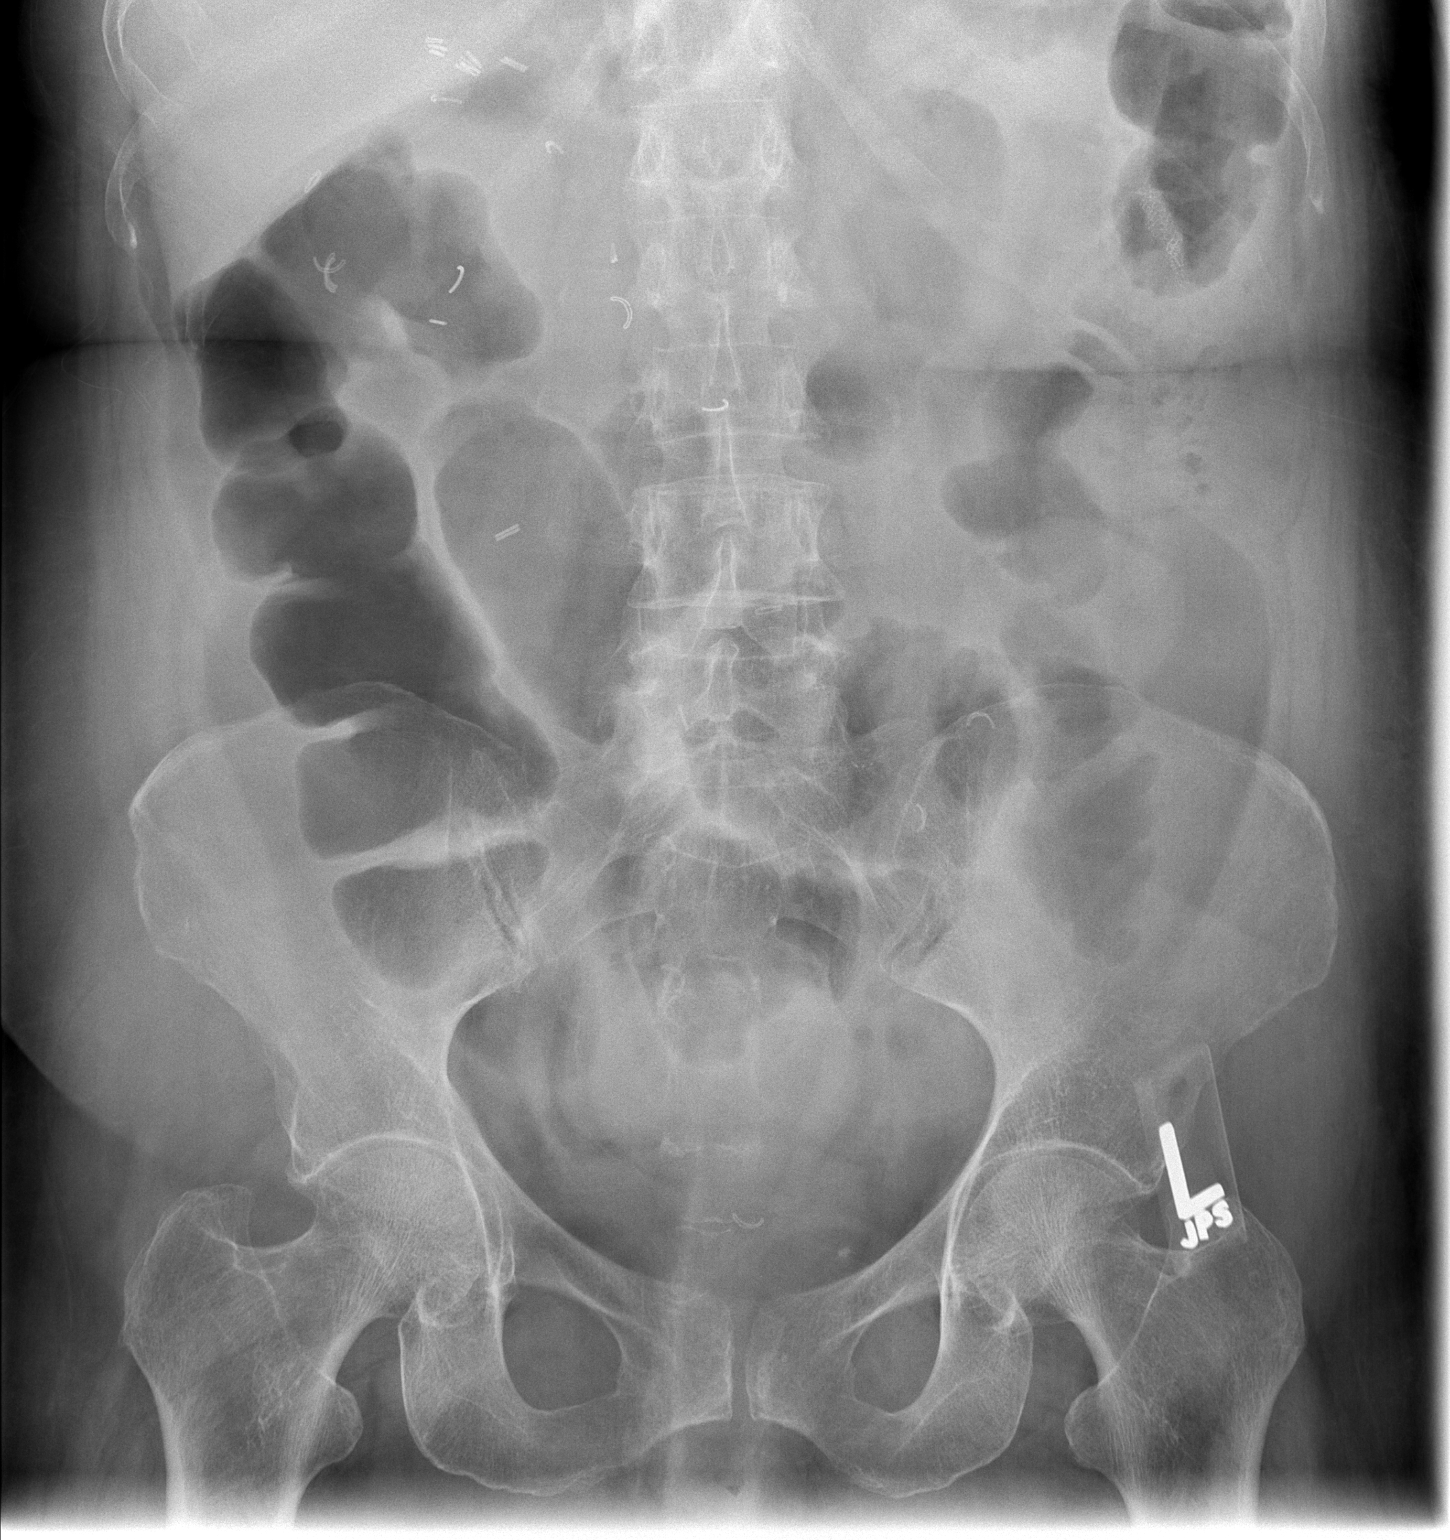

[1 of 1 positions shown; findings below may reference images not displayed]

FINDINGS: There is colonic bowel gas present.  No definite evidence
of bowel obstruction is seen.  There is a loop of small bowel in
the left abdomen which is minimally prominent, and follow-up is
recommended.  Surgical clips are noted in the right upper quadrant.
IMPRESSION: No definite obstruction.  Minimally prominent loop of small bowel
in the left abdomen.

## 2010-07-29 IMAGING — CR DG ABDOMEN ACUTE W/ 1V CHEST
3 series · 3 of 3 positions shown · non-contrast
Comparison: Chest x-ray of 07/08/2009 and abdomen films of
07/10/2009

CLINICAL DATA: Epigastric pain, history of Crohn's disease

ACUTE ABDOMEN SERIES (ABDOMEN 2 VIEW & CHEST 1 VIEW)

[w chest pa]
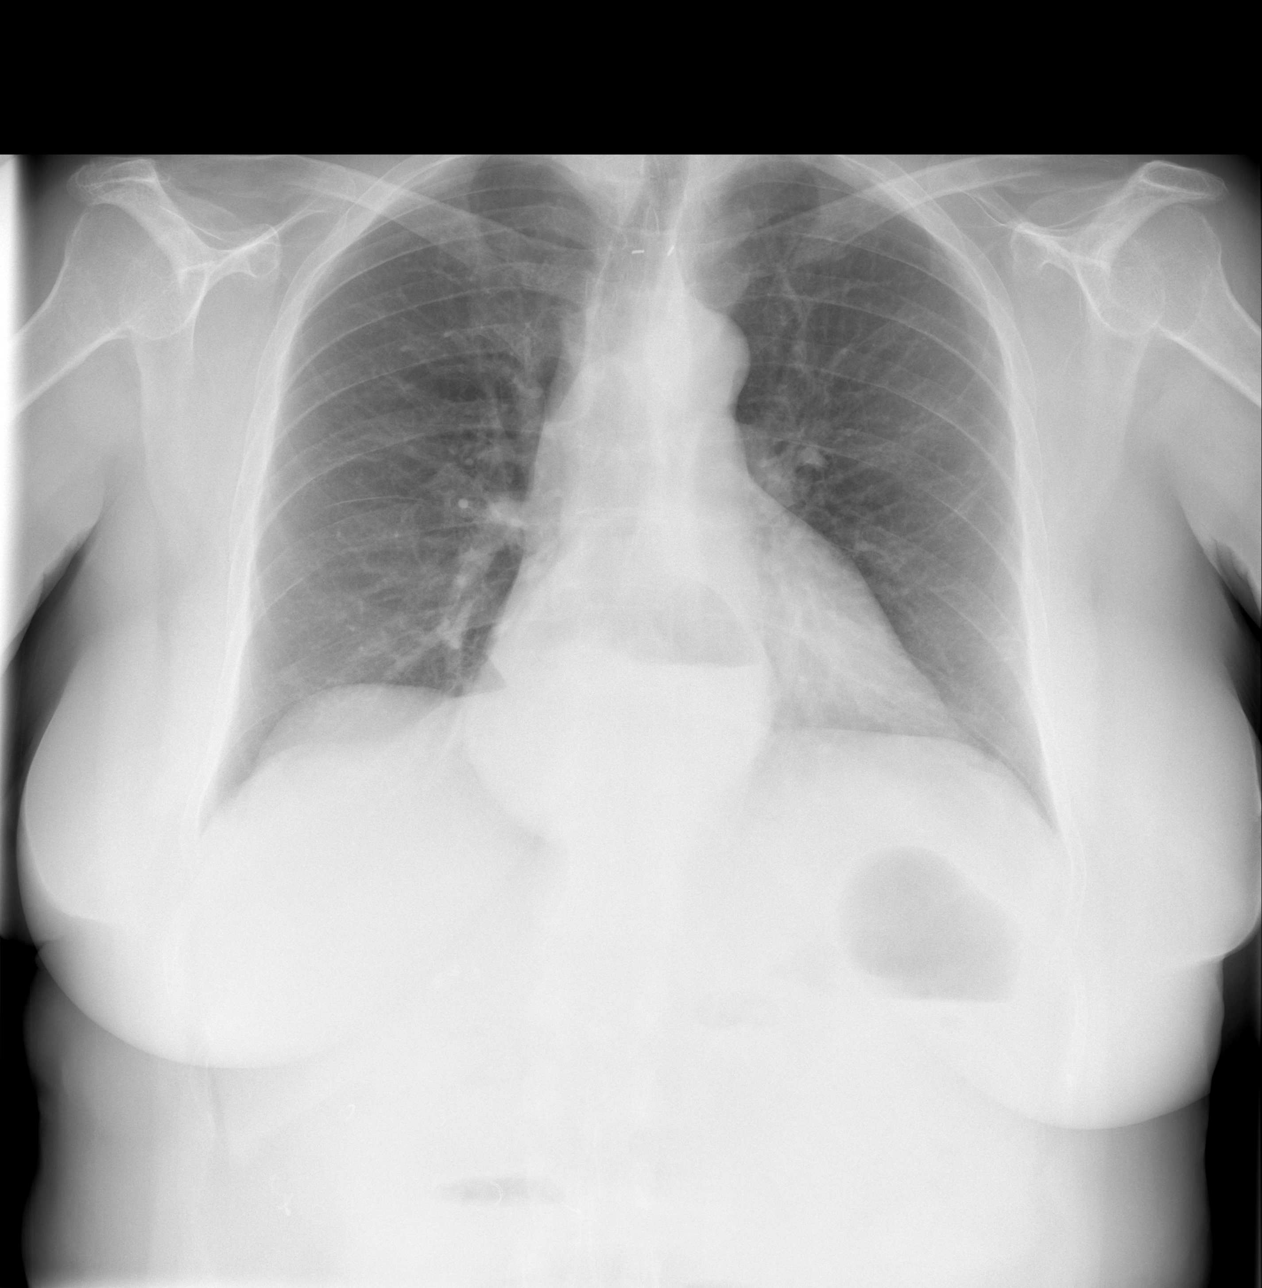

[w abdomen upright]
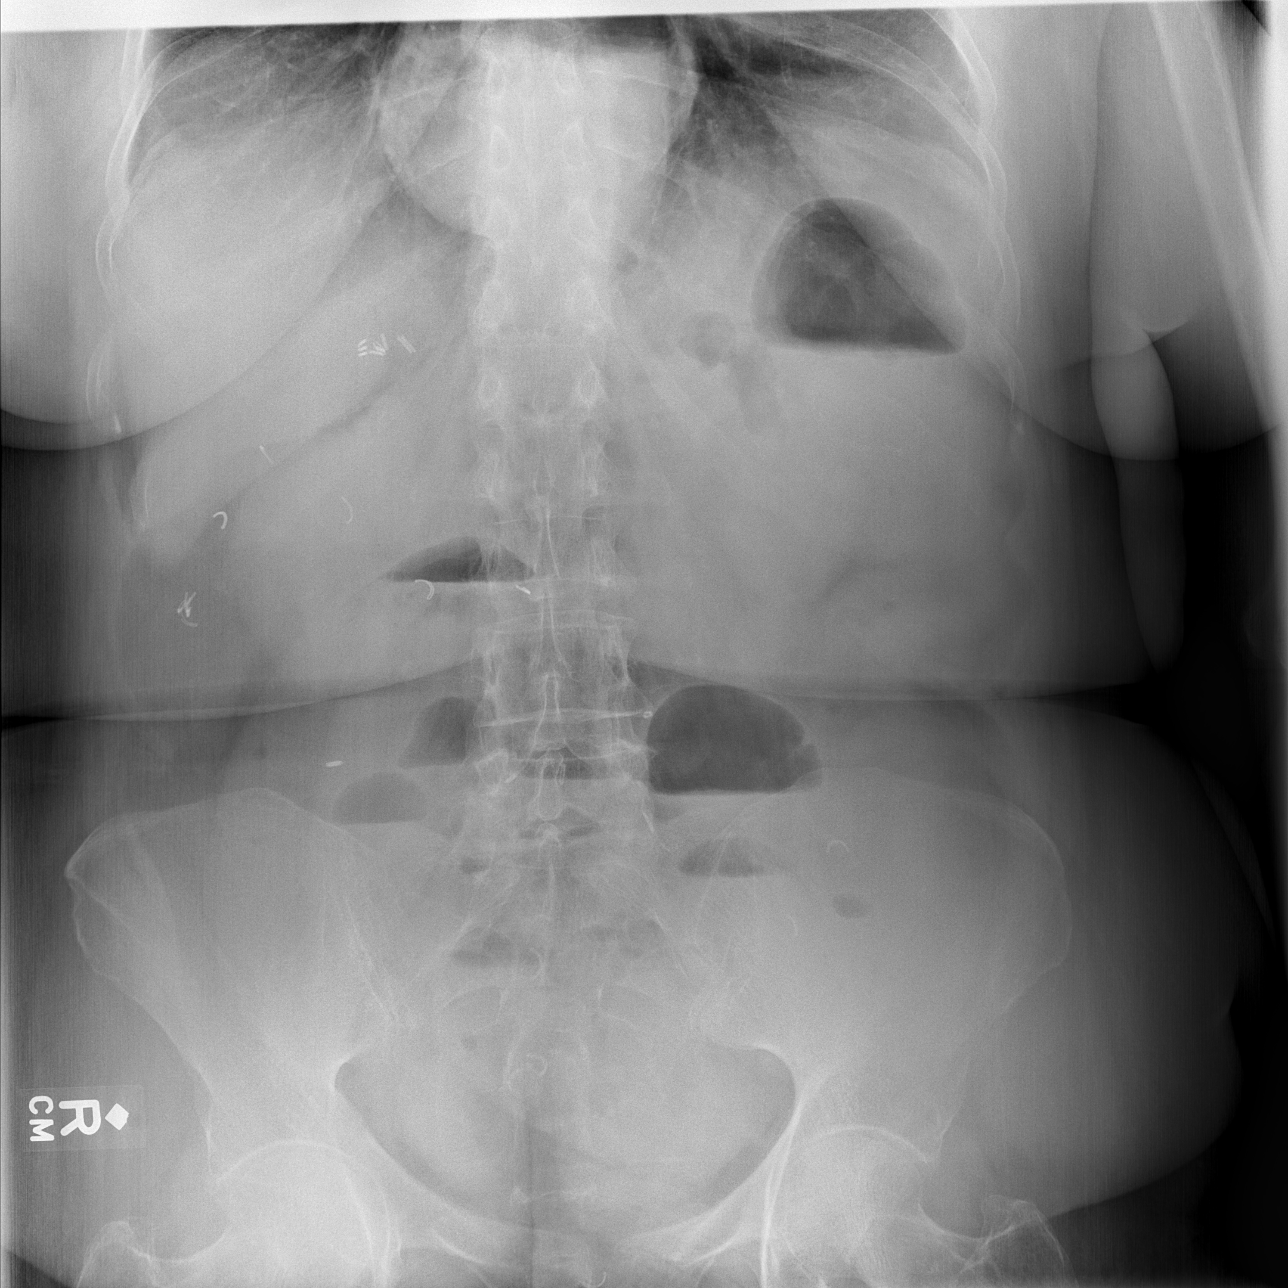

[t abdomen supine]
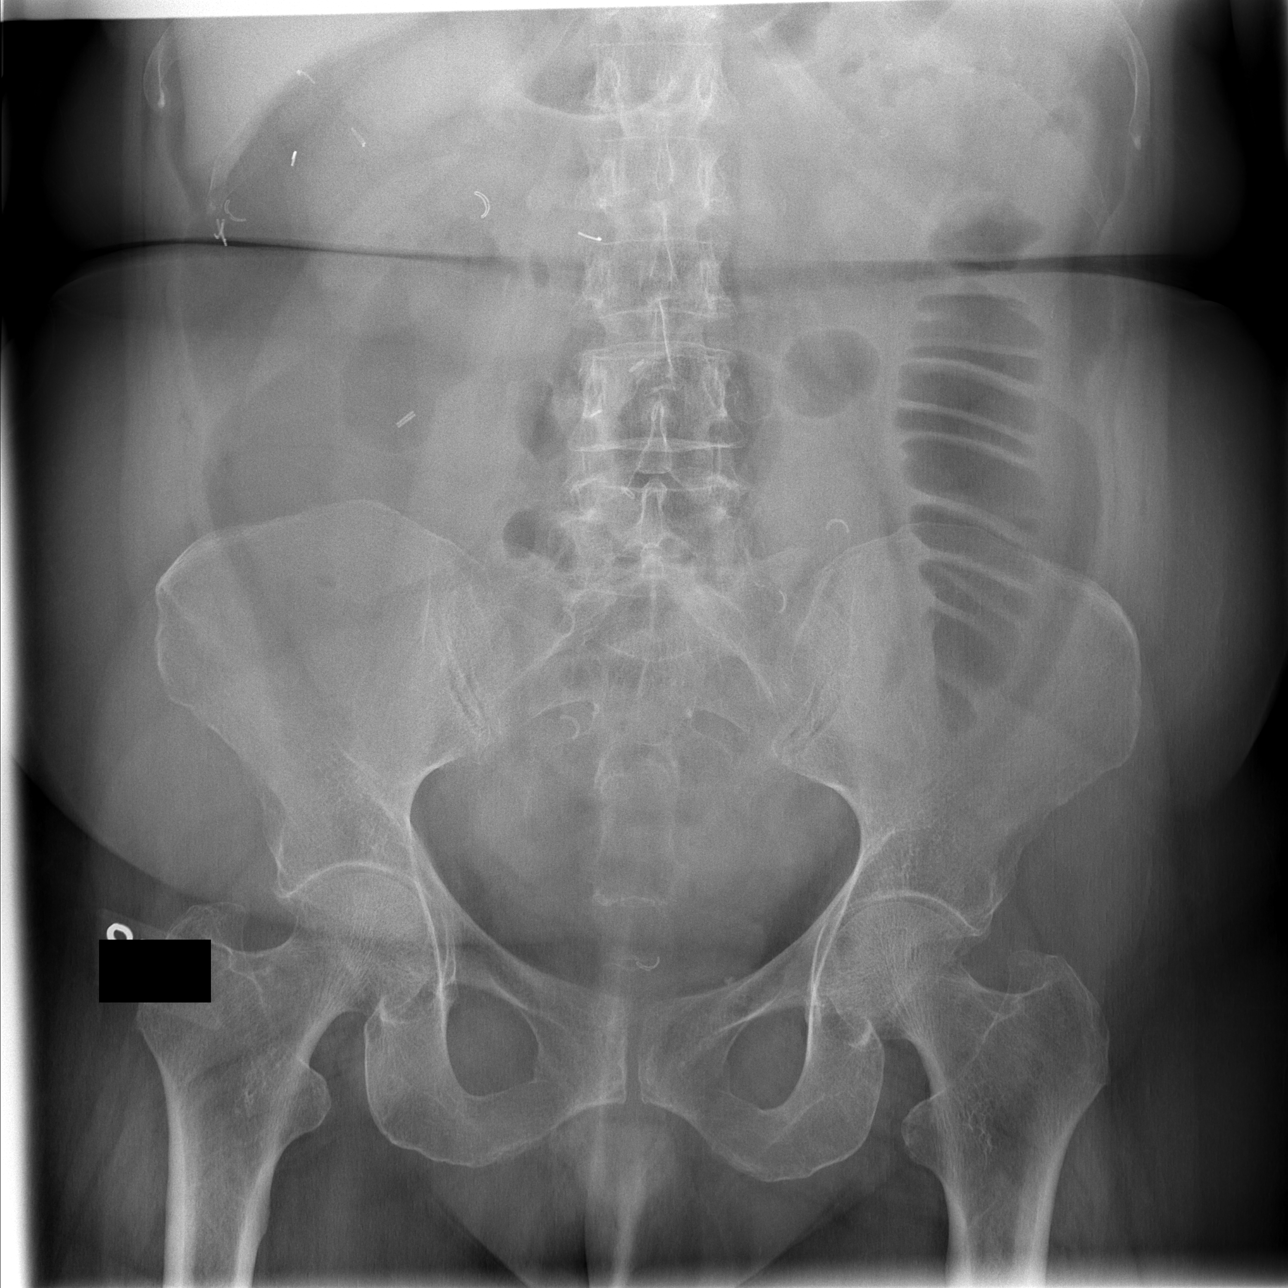

[3 of 3 positions shown; findings below may reference images not displayed]

FINDINGS: The lungs are clear.  The heart is within upper limits of
normal.  A moderate sized hiatal hernia is noted.

There are dilated loops of small bowel with air-fluid levels
consistent with small bowel obstruction.  No colonic bowel gas is
seen.  Multiple surgical clips are present throughout the abdomen.
IMPRESSION: 1.  Dilated small bowel loops with air-fluid levels consistent with
small bowel obstruction.  No free air.
2.  No active lung disease.
3.  Moderate sized hiatal hernia.

## 2010-08-08 IMAGING — CR DG ABDOMEN ACUTE W/ 1V CHEST
3 series · 3 of 3 positions shown · non-contrast
Comparison: 07/15/2009

CLINICAL DATA: Abdominal pain, swelling.

ACUTE ABDOMEN SERIES (ABDOMEN 2 VIEW & CHEST 1 VIEW)

[w chest pa]
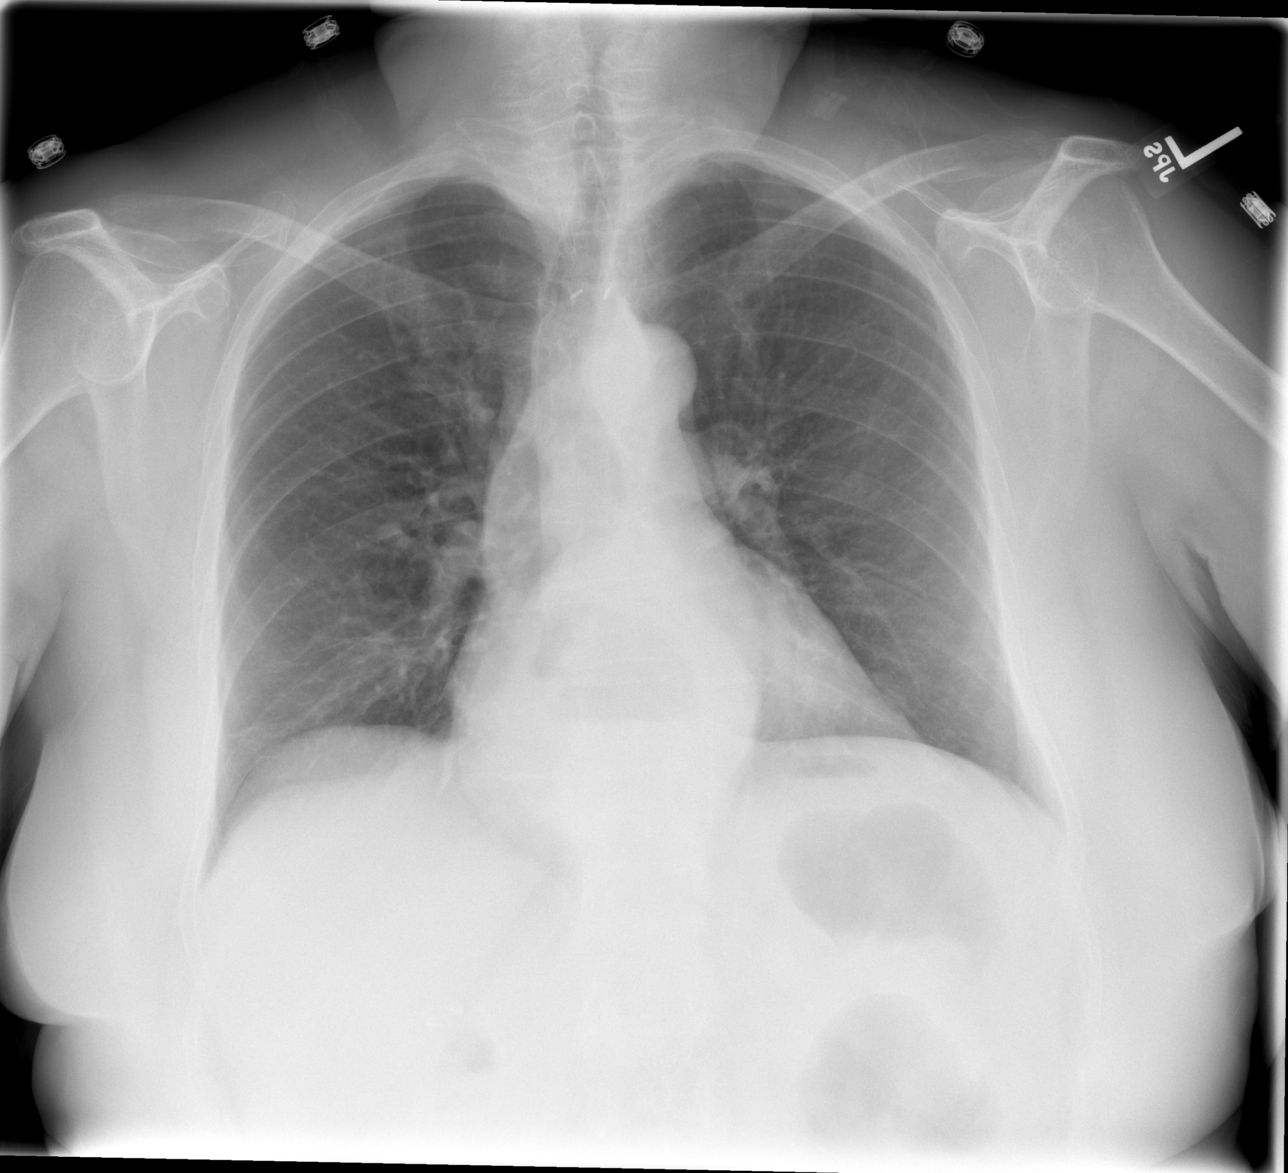

[w abdomen upright]
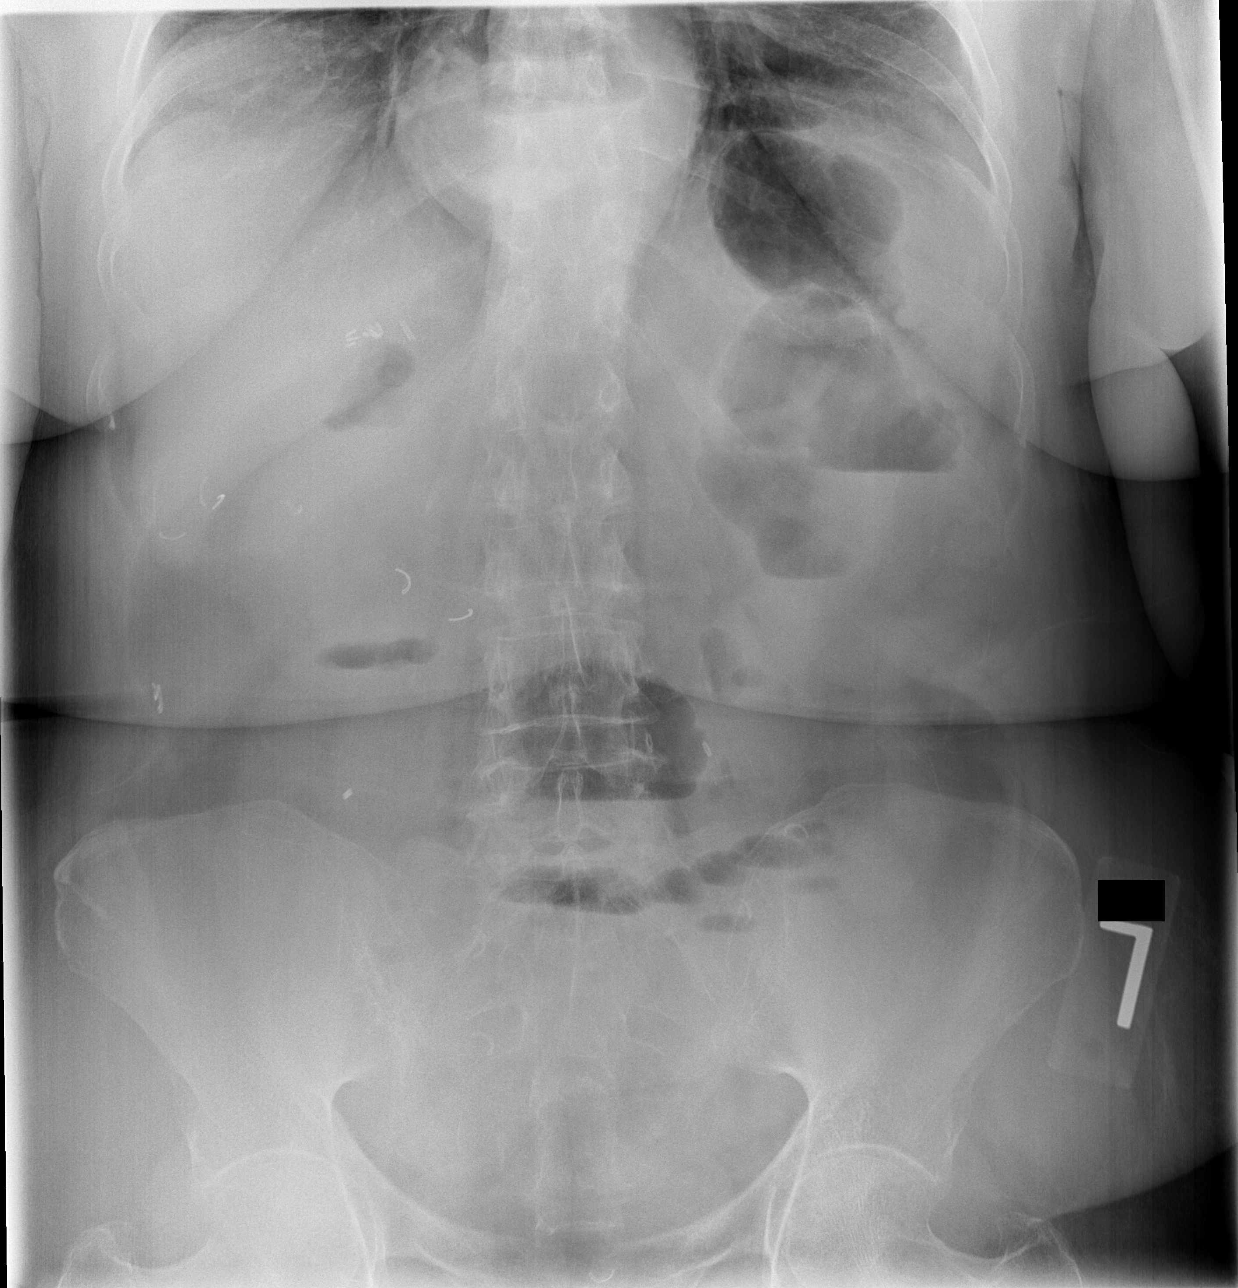

[t abdomen supine]
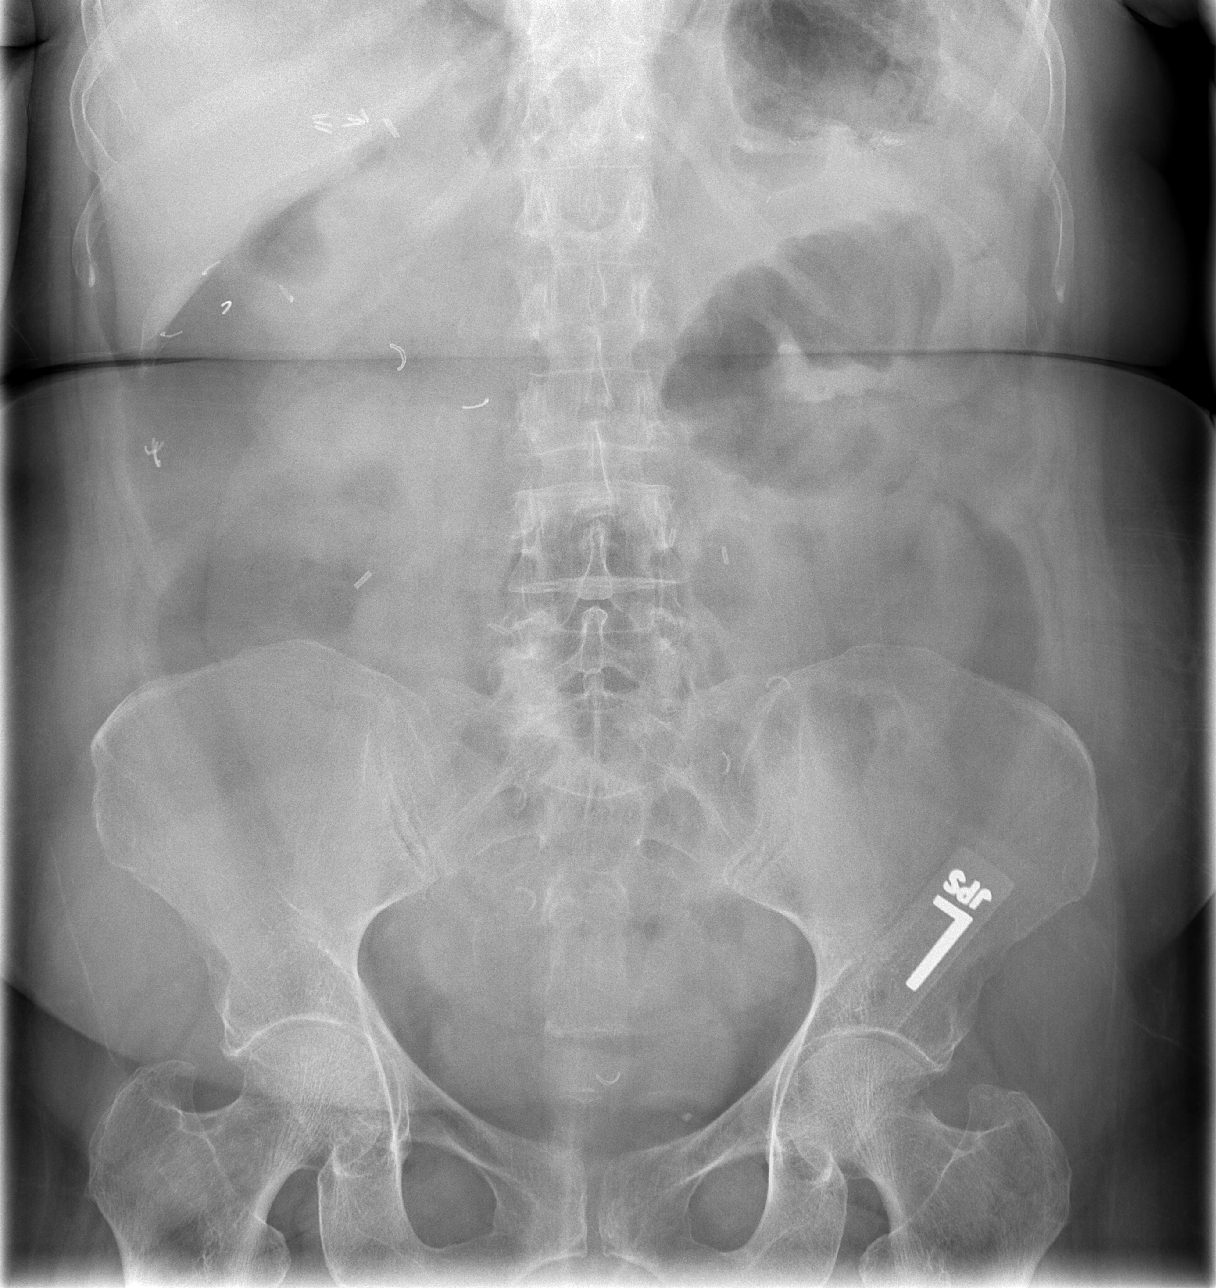

[3 of 3 positions shown; findings below may reference images not displayed]

FINDINGS: Moderate sized hiatal hernia again noted, unchanged.
Heart is normal size.  Lungs are clear.  No effusions.

Dilated small bowel loops with air-fluid levels seen with in the
abdomen compatible with small bowel obstruction.  Findings similar
to prior study.  Surgical clips throughout the abdomen.  No free
air.
IMPRESSION: Continued small bowel obstruction pattern.  This is not
significantly changed since previous study.

## 2010-09-24 ENCOUNTER — Emergency Department (HOSPITAL_COMMUNITY): Admission: EM | Admit: 2010-09-24 | Discharge: 2010-09-24 | Payer: Self-pay | Admitting: Emergency Medicine

## 2010-11-06 ENCOUNTER — Ambulatory Visit (HOSPITAL_COMMUNITY): Payer: Self-pay | Admitting: Internal Medicine

## 2010-11-06 ENCOUNTER — Encounter (HOSPITAL_COMMUNITY)
Admission: RE | Admit: 2010-11-06 | Discharge: 2010-12-06 | Payer: Self-pay | Source: Home / Self Care | Attending: Internal Medicine | Admitting: Internal Medicine

## 2010-12-02 ENCOUNTER — Ambulatory Visit: Payer: Self-pay | Admitting: Internal Medicine

## 2010-12-03 IMAGING — CR DG ABDOMEN ACUTE W/ 1V CHEST
3 series · 3 of 3 positions shown · non-contrast
Comparison: CT abdomen pelvis of 07/25/2009, and chest with acute
abdomen of 07/25/2009

CLINICAL DATA: Shortness of breath, abdominal pain, nausea,
vomiting, diarrhea

ACUTE ABDOMEN SERIES (ABDOMEN 2 VIEW & CHEST 1 VIEW)

[w chest pa]
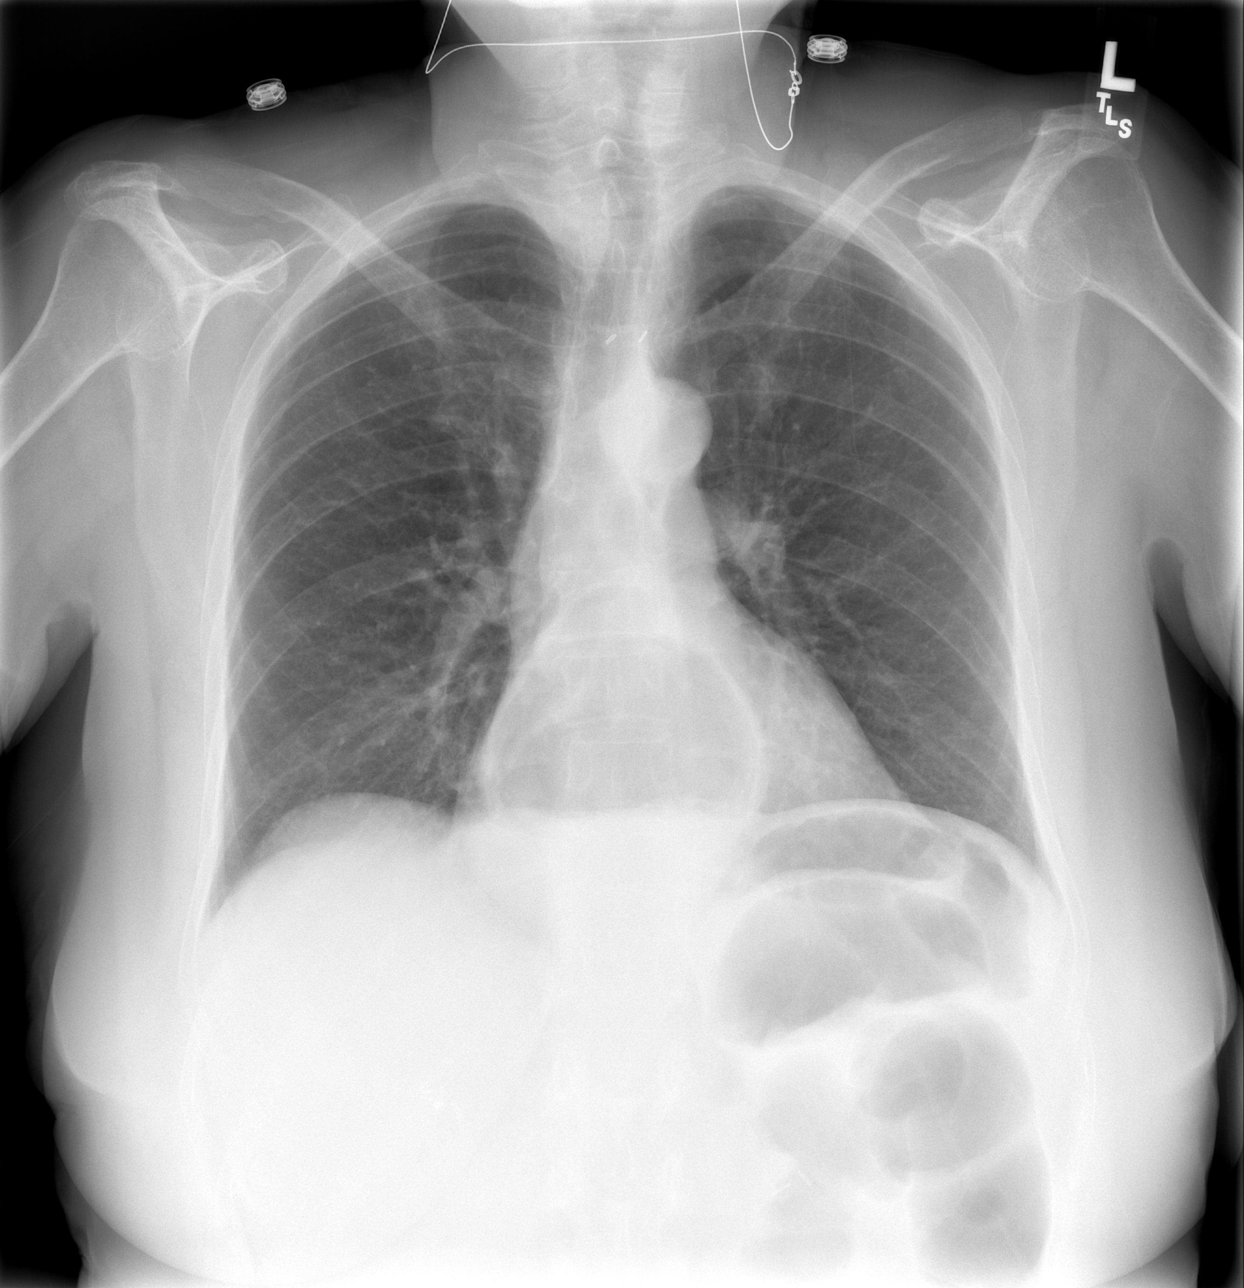

[w abdomen upright]
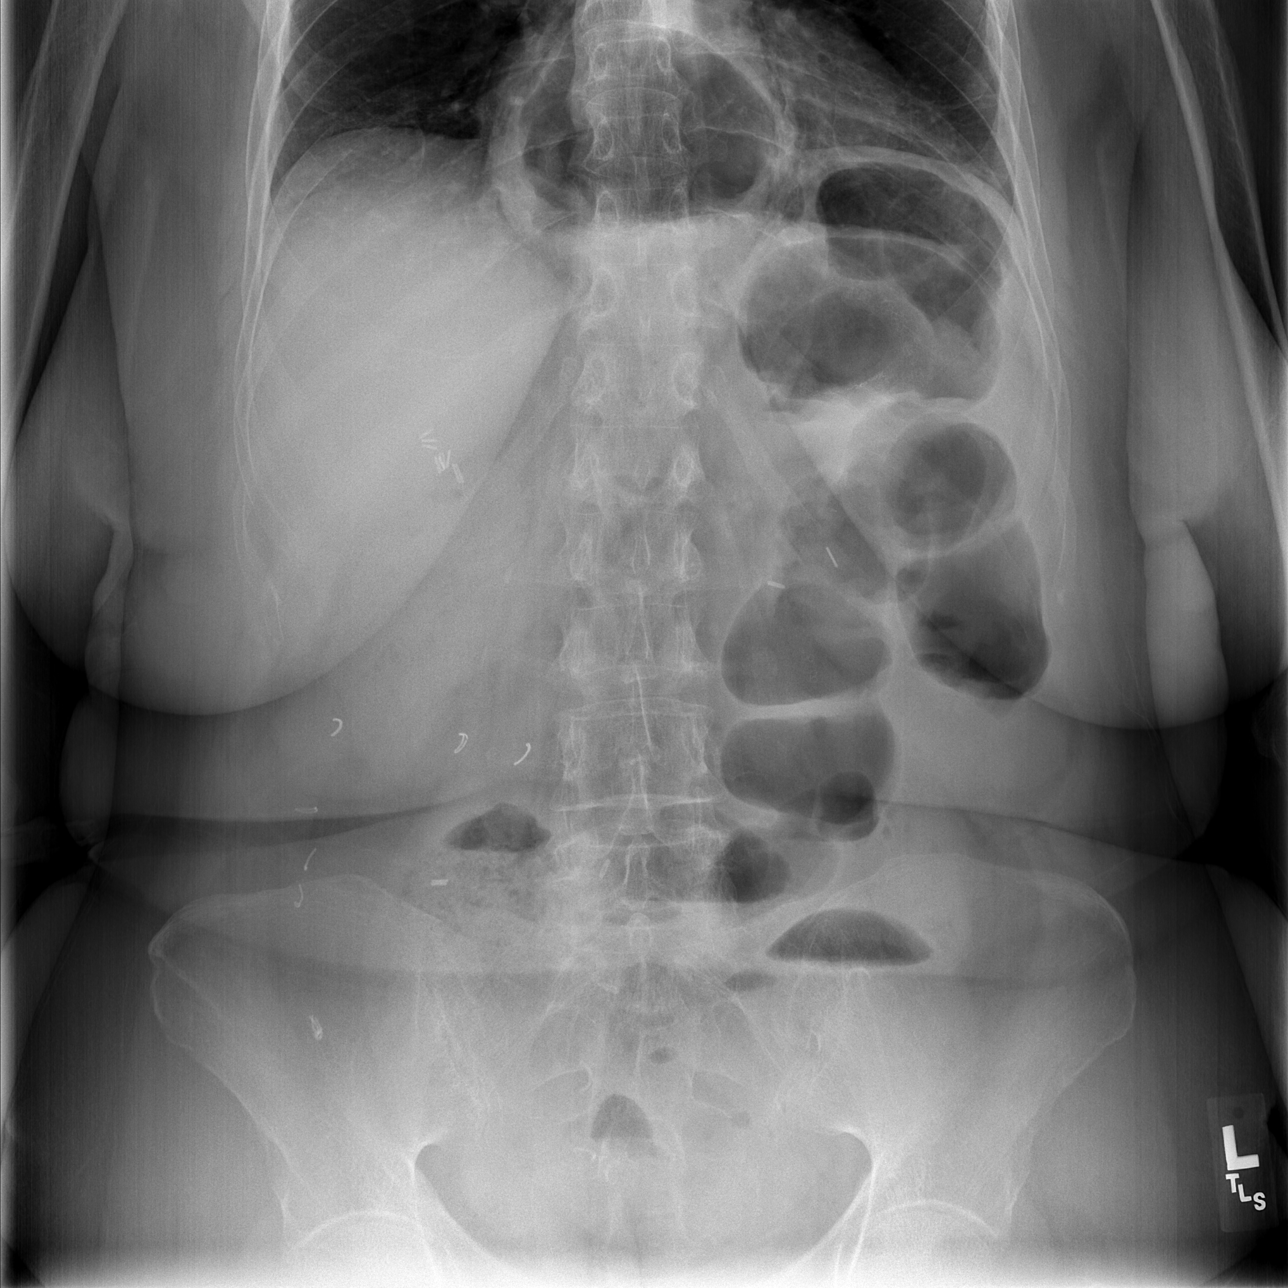

[t abdomen supine]
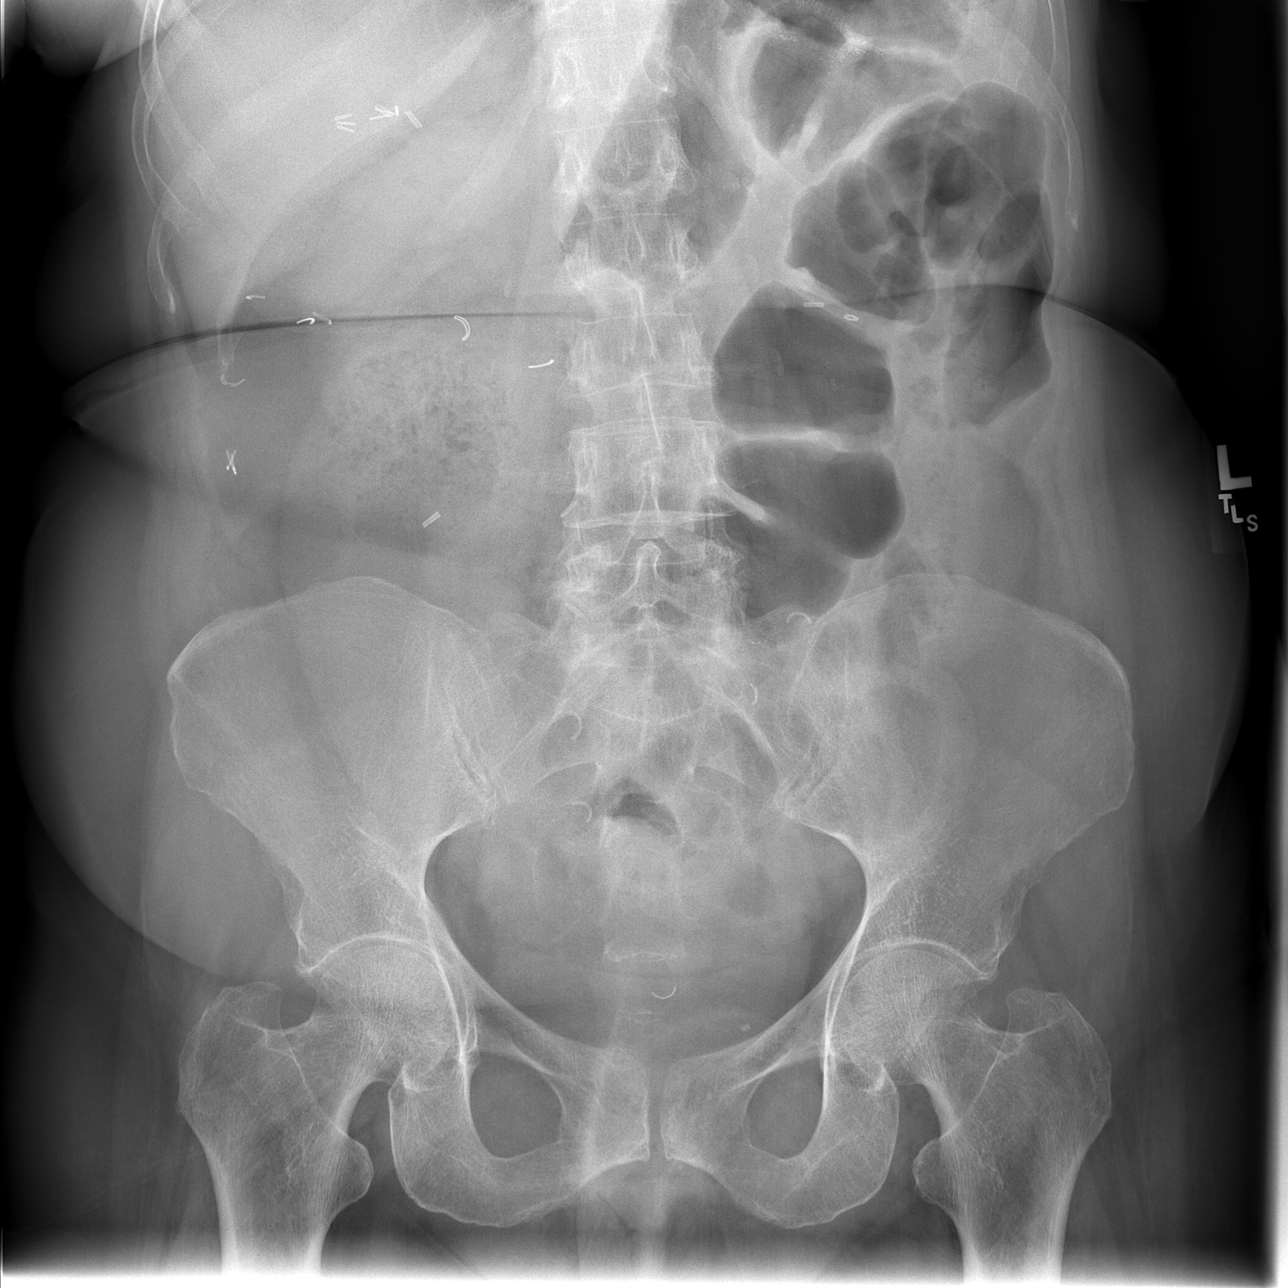

[3 of 3 positions shown; findings below may reference images not displayed]

FINDINGS: The lungs are clear.  A moderate sized hiatal hernia is
noted.  Heart size is stable.

The gas noted throughout the abdomen is primarily in the colon
without distention.  No definite small bowel gaseous distention is
seen.  However it would be difficult to exclude fluid distended
loops of small bowel.  No free air is seen.  Multiple surgical
clips are noted.
IMPRESSION: 1.  No definite gaseous distention of small bowel loops is seen,
but the small bowel loops may be fluid distended.  Cannot exclude
small bowel obstruction on the current films.  No free air.
2.  Moderate sized hiatal hernia.
3.  No active lung disease.

## 2010-12-28 ENCOUNTER — Encounter: Payer: Self-pay | Admitting: Unknown Physician Specialty

## 2010-12-30 ENCOUNTER — Encounter (HOSPITAL_COMMUNITY): Admission: RE | Admit: 2010-12-30 | Payer: Self-pay | Source: Home / Self Care | Admitting: Internal Medicine

## 2010-12-31 IMAGING — CT CT ABD-PELV W/ CM
2 of 5 series · 12 of 36 positions shown, 19 images · IV contrast (water & 100 ML OMNI 300)
Comparison: 07/25/2009.

CLINICAL DATA: Nausea.  Vomiting.

CT ABDOMEN AND PELVIS WITH CONTRAST
TECHNIQUE: Multidetector CT imaging of the abdomen and pelvis was
performed following the standard protocol during bolus
administration of intravenous contrast.
Contrast: 100 ml 5mnipaque-FGG.

[Series 2: routine abdomen · axial · 0.75mm/px · z∈[-371,-31]mm · 11 of 82 slices shown, 17 images]
[im 7/82  soft-tissue]
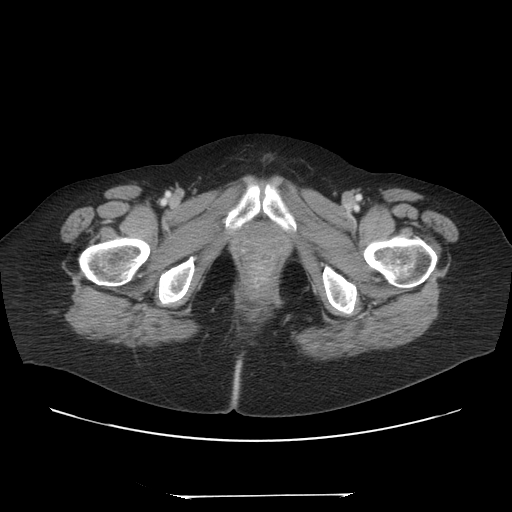
[im 7/82  bone]
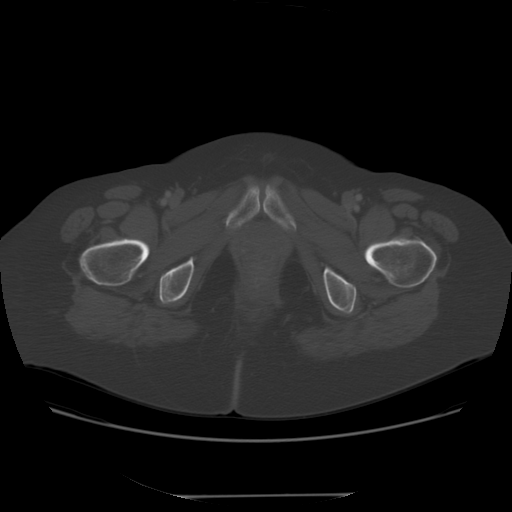
[im 14/82  soft-tissue]
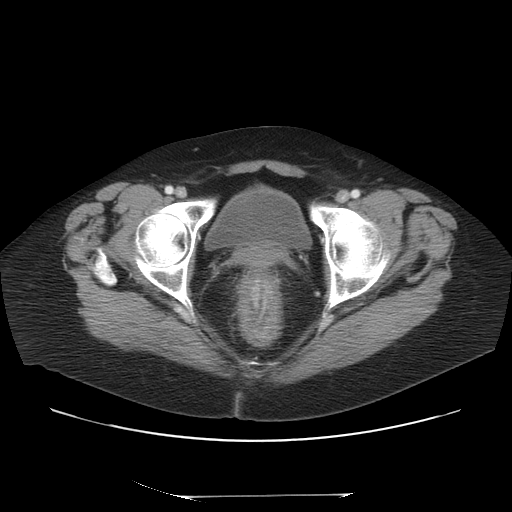
[im 21/82  soft-tissue]
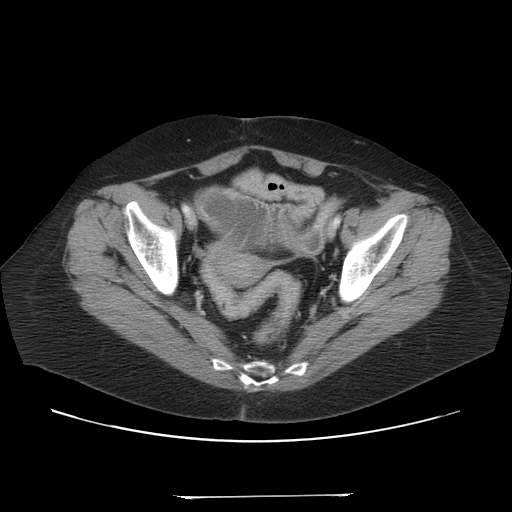
[im 28/82  soft-tissue]
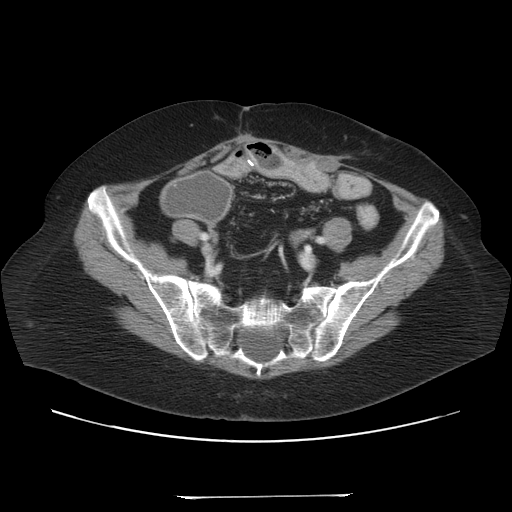
[im 34/82  soft-tissue]
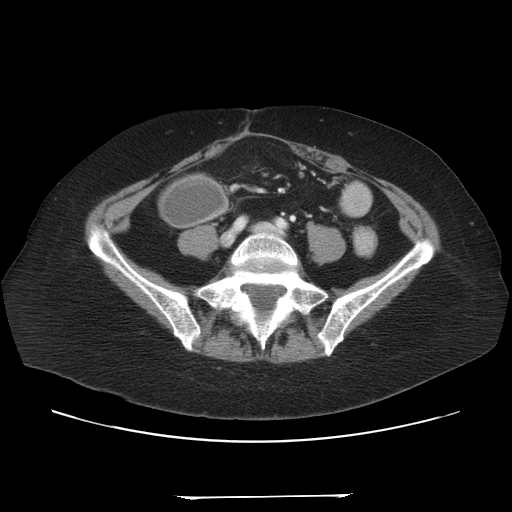
[im 41/82  soft-tissue]
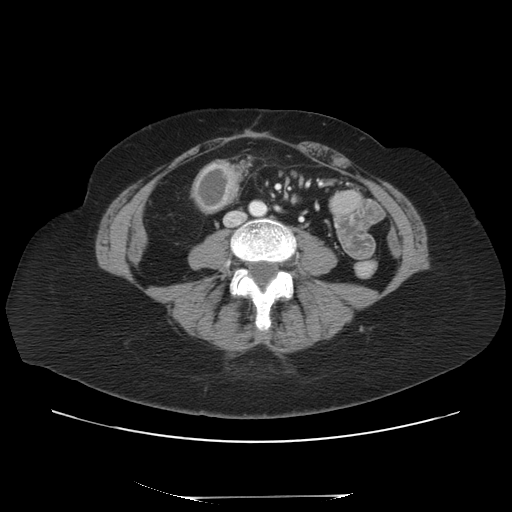
[im 48/82  soft-tissue]
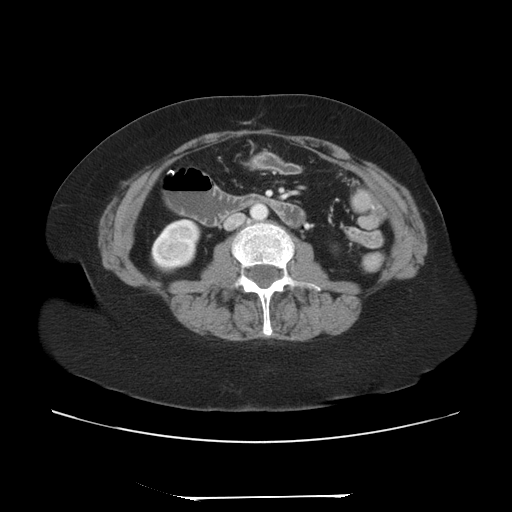
[im 55/82  soft-tissue]
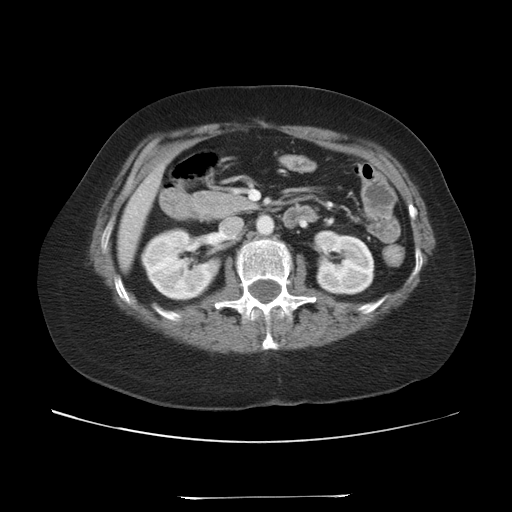
[im 55/82  lung]
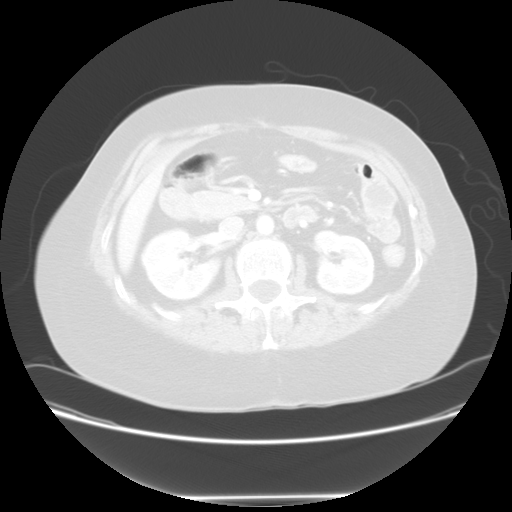
[im 61/82  soft-tissue]
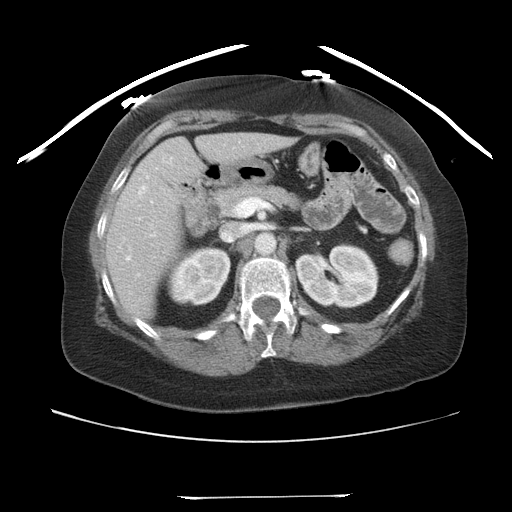
[im 61/82  lung]
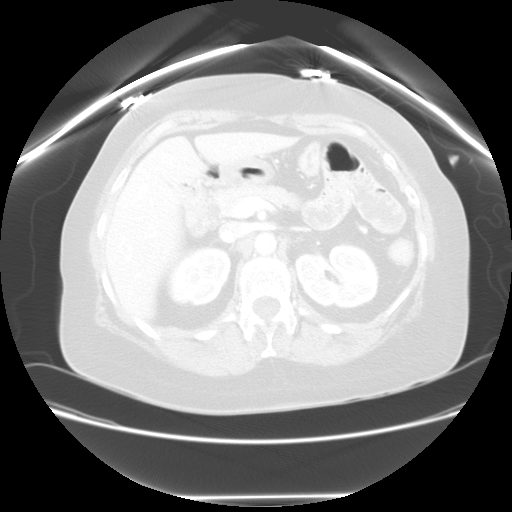
[im 61/82  bone]
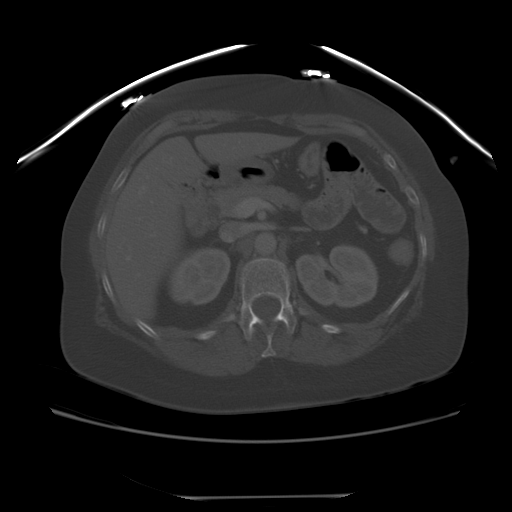
[im 68/82  soft-tissue]
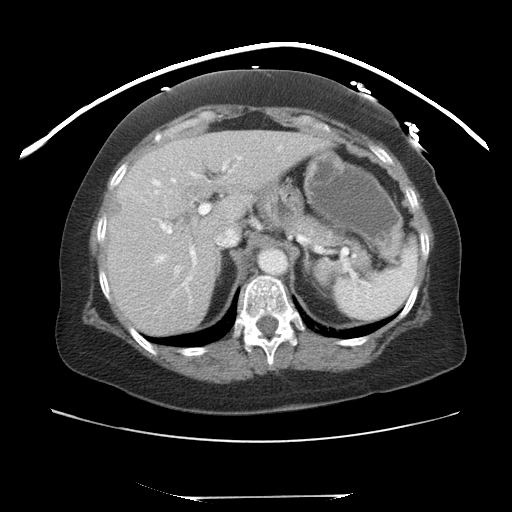
[im 68/82  lung]
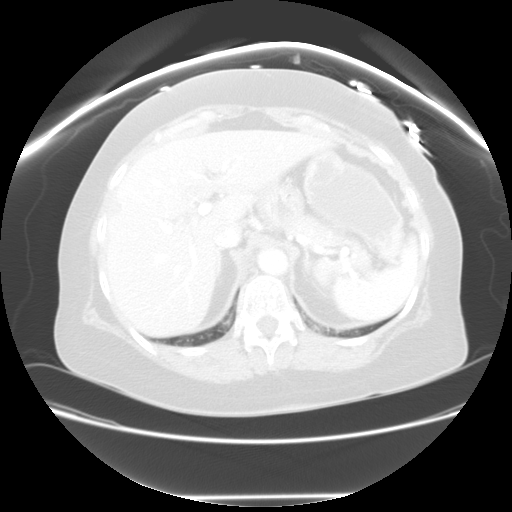
[im 75/82  soft-tissue]
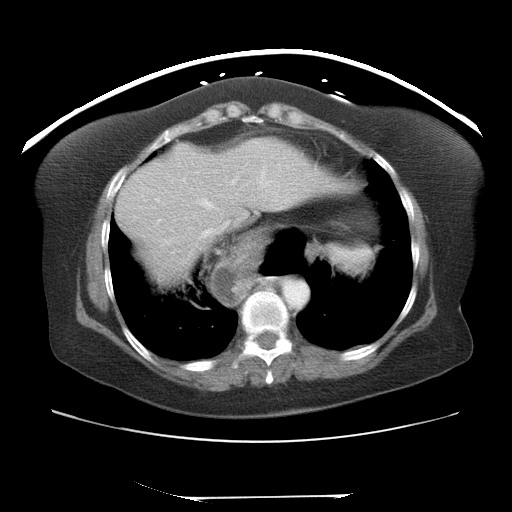
[im 75/82  lung]
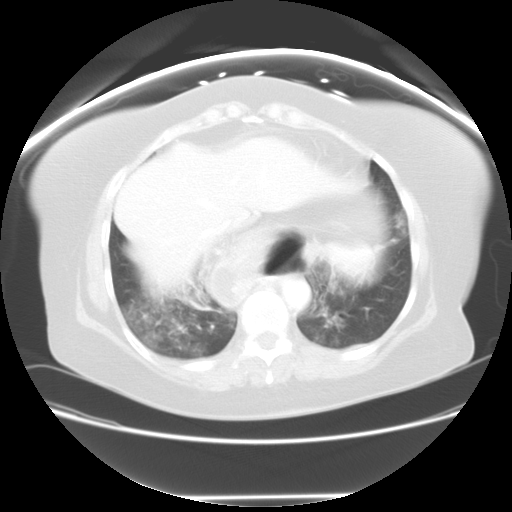

[Series 401: sag · sagittal · 0.81mm/px · 1 of 109 slices shown, 2 images]
[im 37/109  soft-tissue]
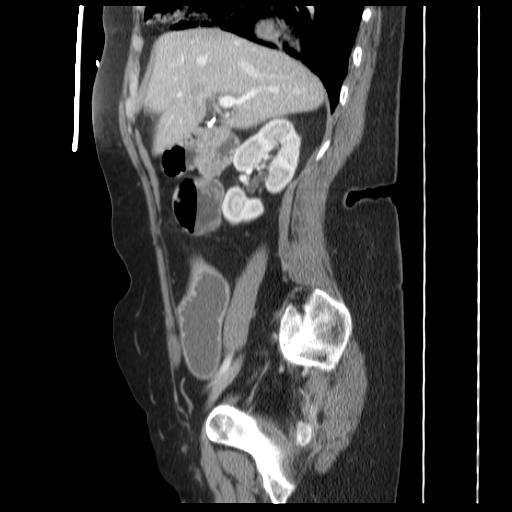
[im 37/109  bone]
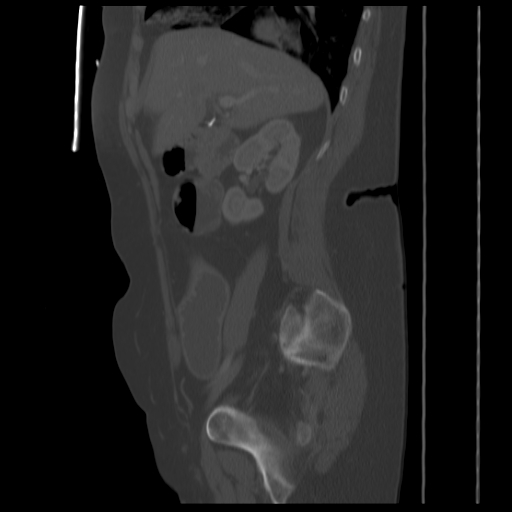

[12 of 36 positions shown; findings below may reference images not displayed]

FINDINGS: Large hiatal hernia is present.  Airspace disease is
present within the right middle lobe with patchy less pronounced
airspace disease in both lower lobes.  Findings suggestive of
pneumonia with aspiration considered unlikely based on the
distribution.

The liver shows a small cyst in the right hepatic lobe.
Cholecystectomy.  No free air is present in the abdomen.  The
pancreas and kidneys appear within normal limits.  Mild thickening
of the antrum of the stomach is likely due to under distention.

Distention of the second part of the duodenum with fluid.  Jejunum
appears within normal limits.  Ascending colectomy is present.
Ileocolonic anastomosis is present in the left upper quadrant.
There is dilation of the distal small bowel extending up to the
enterocolonic anastomosis.  Mural thickening is present in the
upper abdomen, with hyperenhancement of small bowel mucosa
consistent with active enteritis.  There is dilation of a loop of
small bowel in the expected location of the splenic flexure of the
colon.  No free fluid in the anatomic pelvis.  Bilateral L5 pars
defects without spondylolisthesis.
IMPRESSION: 1.  Right middle lobe and bilateral lower lobe airspace opacity
consistent with pneumonia.
2.  Large hiatal hernia.
3.  Dilation of the distal small bowel proximal to the ileocolonic
anastomoses.  Mural thickening is consistent with active Crohn's
disease.  No complete obstruction is identified.
4.  Focal areas of dilated small bowel indicating with stricture.
5.  Ascending colectomy.

## 2010-12-31 IMAGING — CR DG ABDOMEN ACUTE W/ 1V CHEST
3 series · 3 of 3 positions shown · non-contrast
Comparison: 11/19/2009.

CLINICAL DATA: Nausea and vomiting.

ACUTE ABDOMEN SERIES (ABDOMEN 2 VIEW & CHEST 1 VIEW)

[w chest pa]
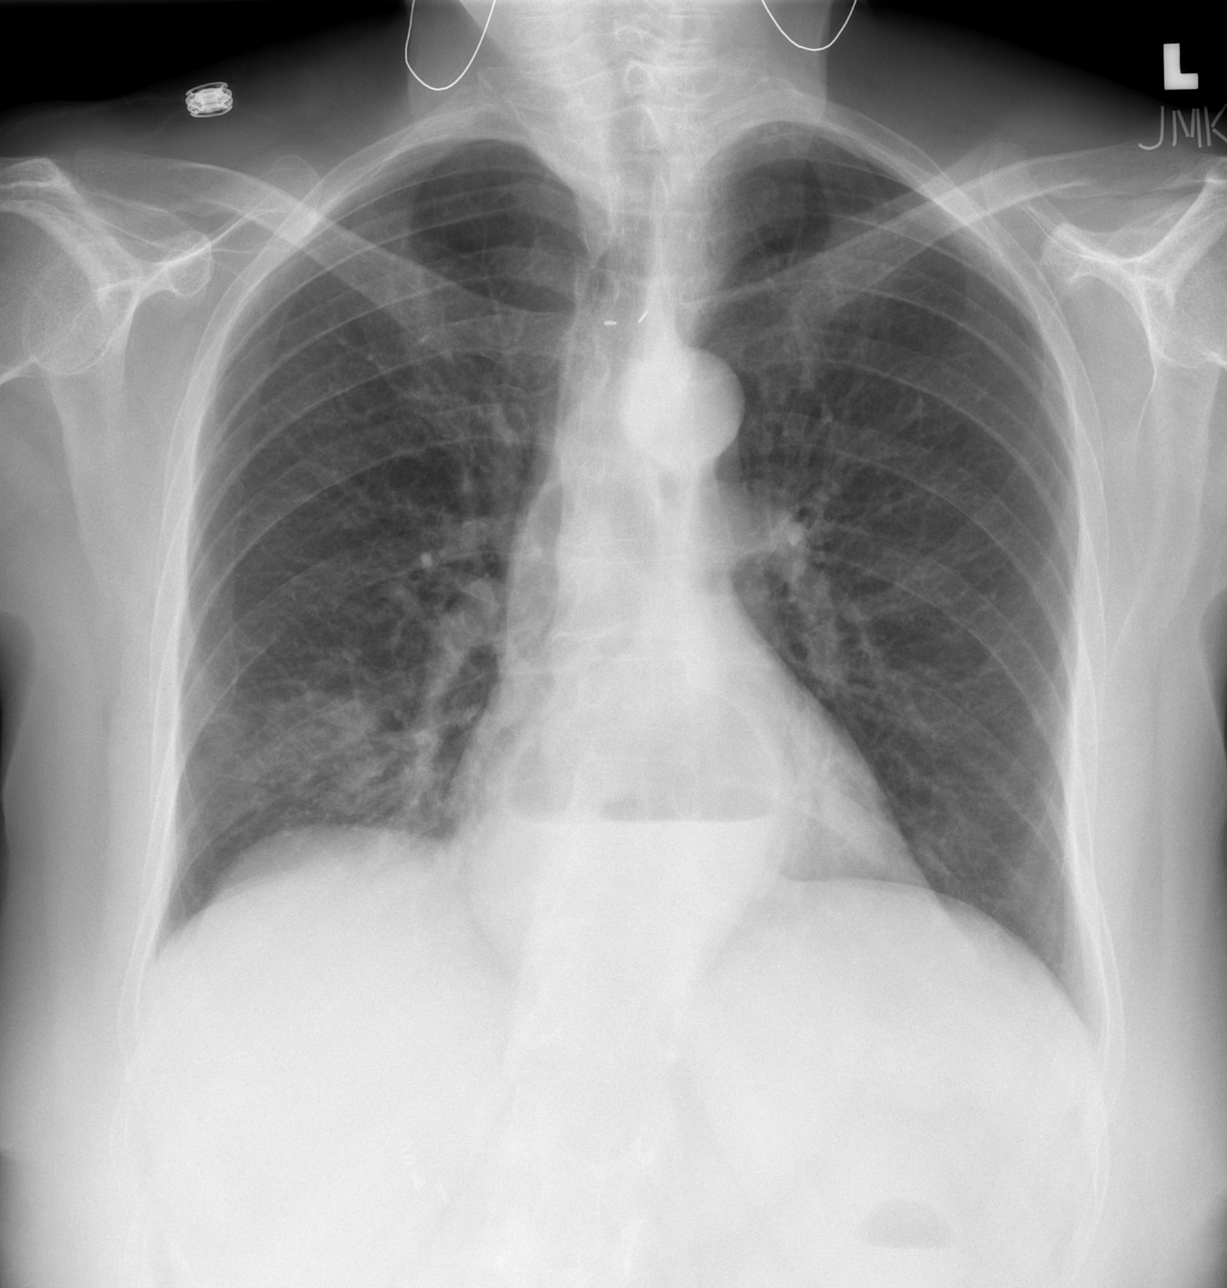

[w abdomen upright]
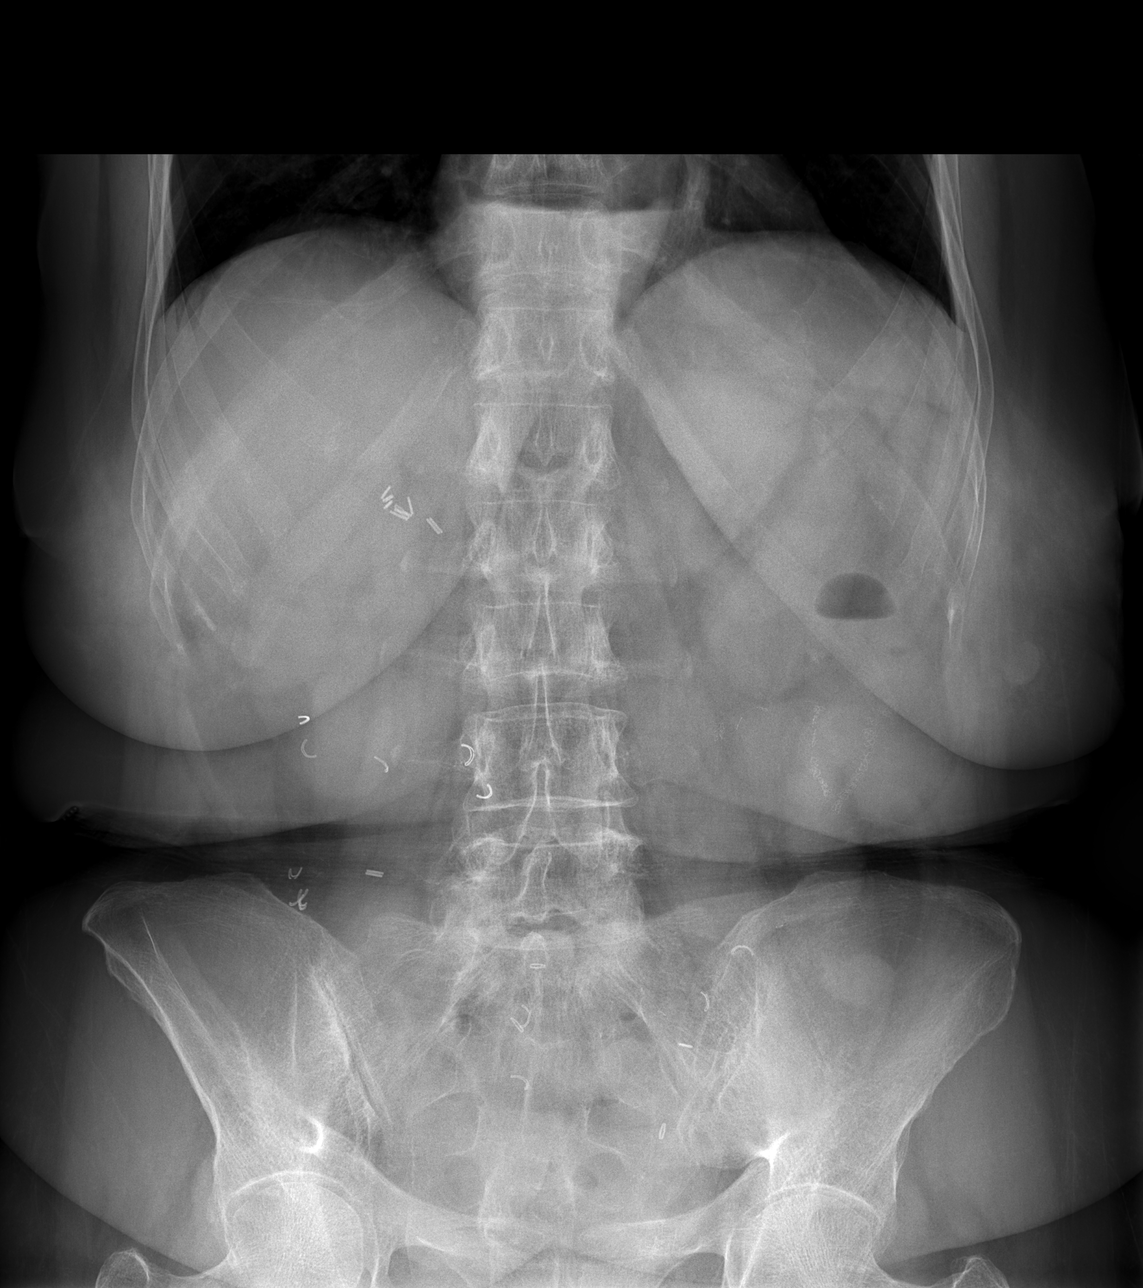

[t abdomen supine]
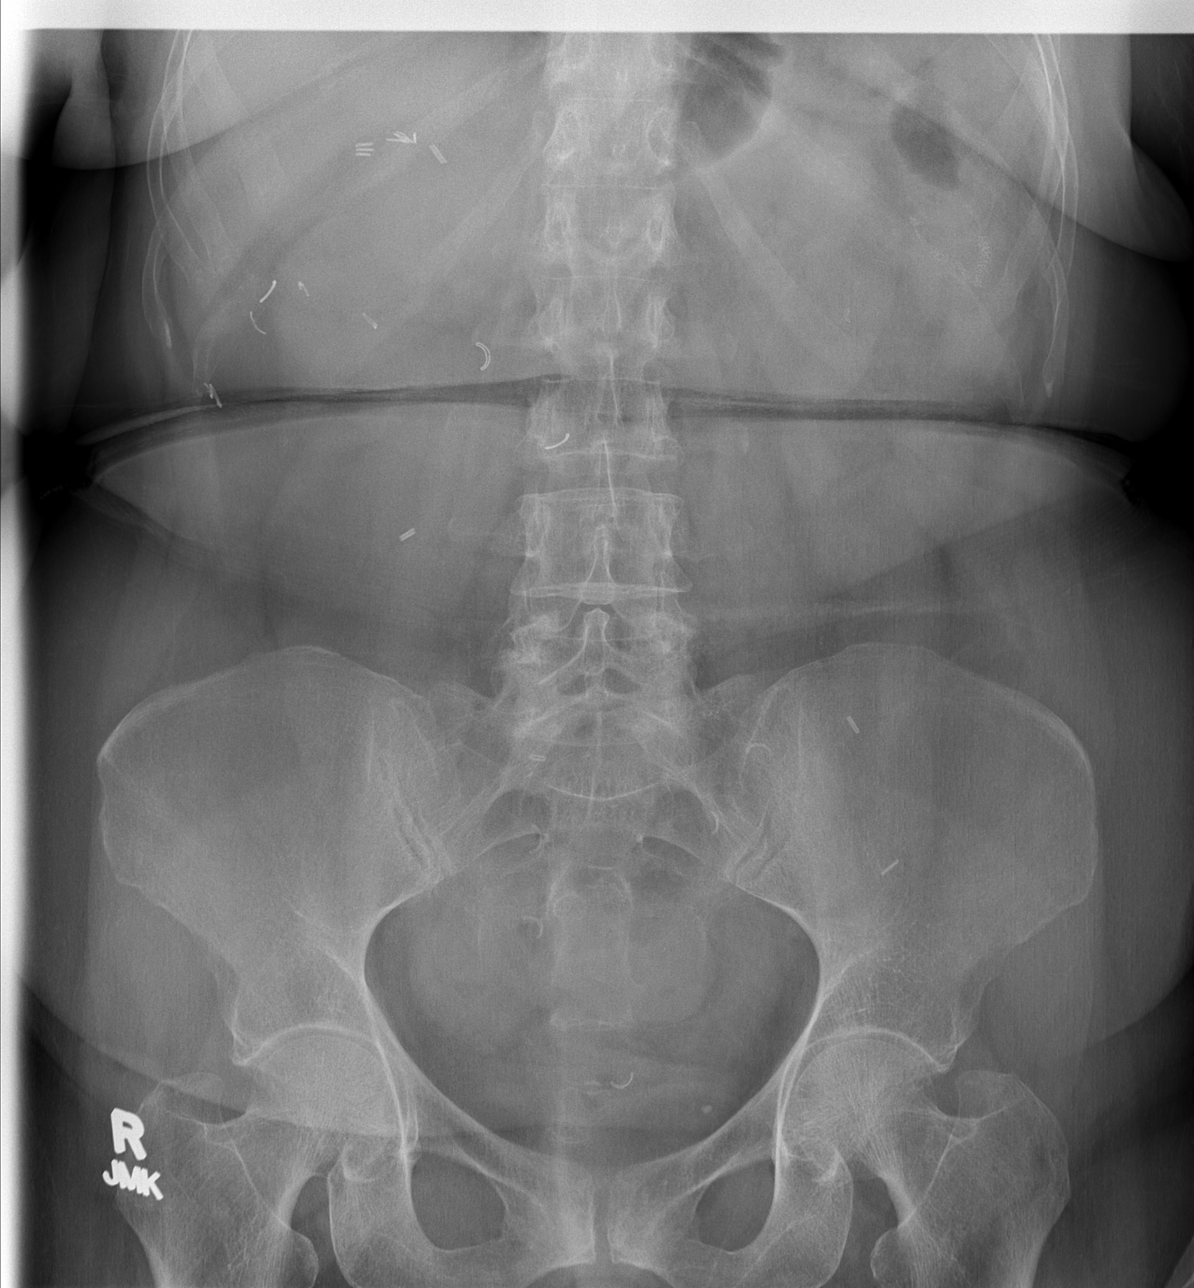

[3 of 3 positions shown; findings below may reference images not displayed]

FINDINGS: Patchy airspace disease in the right lung base may
represent pneumonia.  This was not present previously.

There is a large hiatal hernia which is unchanged.  There is no
heart failure or effusion.

Multiple surgical clips are present in the abdomen.  There is no
bowel obstruction or free air.  No acute bony abnormality.
IMPRESSION: Right lower lobe infiltrate, probable pneumonia.

Nonobstructive bowel gas pattern.

## 2011-01-05 IMAGING — CR DG CHEST 2V
1 series · 1 of 1 positions shown · non-contrast
Comparison: Abdominal x-ray on 12/20/2009 and chest x-ray on
12/17/2009

CLINICAL DATA: Pneumonia

CHEST - 2 VIEW

[w chest lat]
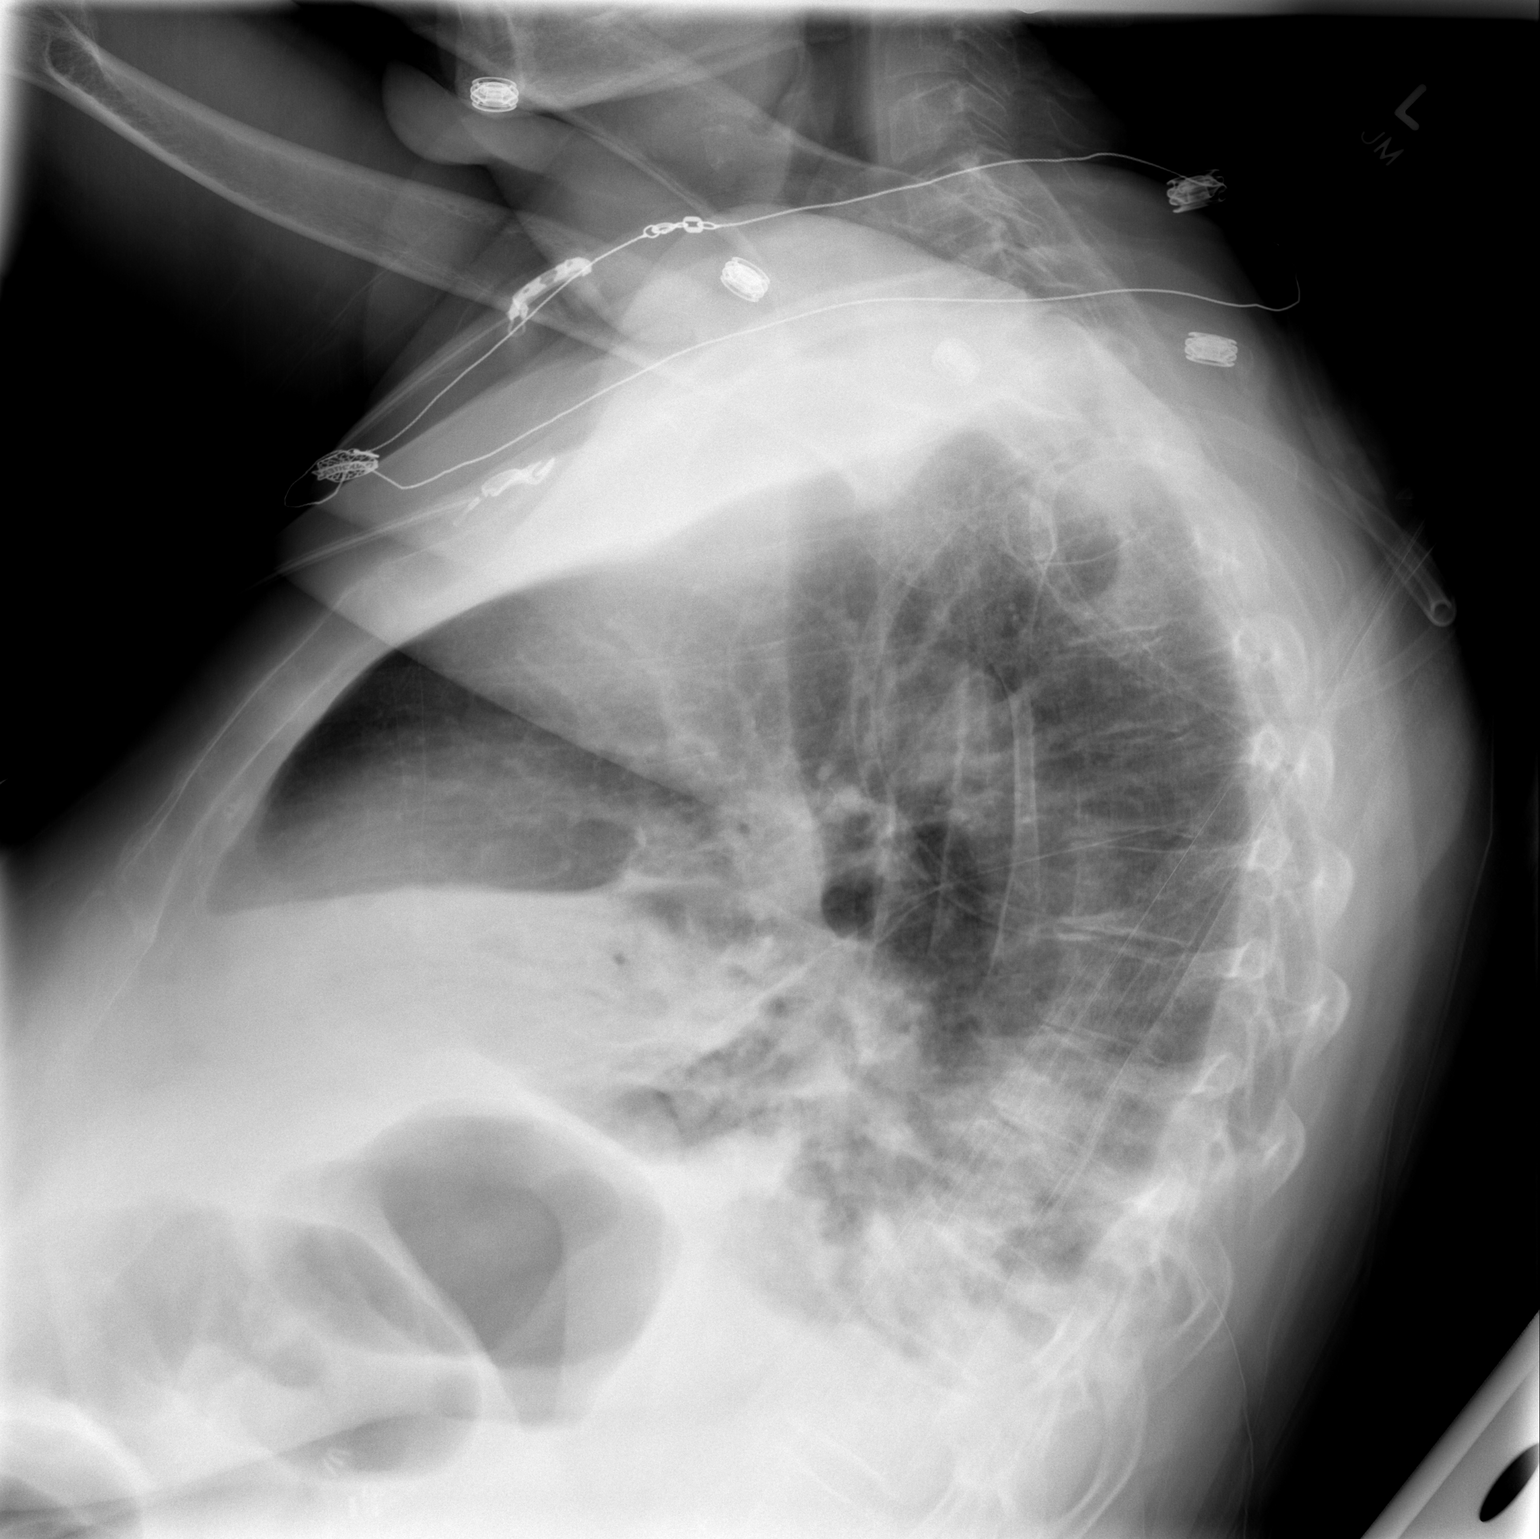

[1 of 1 positions shown; findings below may reference images not displayed]

FINDINGS: There is progressive pneumonia of the right middle lobe
and right lower lobe with increased airspace consolidation present.
There likely is associated small amount of right pleural fluid.
Increased opacity is also noted at the left lung base and evolving
left lower lobe pneumonia may also be present.  Progressive
bilateral abnormalities raises the possibility of aspiration as
etiology of pneumonia.
IMPRESSION: Progressive, dense pneumonia of the right middle and lower lobes
with associated small right pleural effusion.  There also is
progressive opacity at the left lung base suspicious for developing
left lower lobe pneumonia.  Bilateral abnormalities raises the
possibility of aspiration as etiology of pneumonia.

## 2011-01-07 ENCOUNTER — Ambulatory Visit (HOSPITAL_COMMUNITY): Admit: 2011-01-07 | Payer: Self-pay | Admitting: Internal Medicine

## 2011-01-07 ENCOUNTER — Ambulatory Visit (HOSPITAL_COMMUNITY): Payer: Medicare Other

## 2011-01-07 ENCOUNTER — Encounter (HOSPITAL_COMMUNITY): Payer: Medicare Other | Attending: Gastroenterology

## 2011-01-07 DIAGNOSIS — K5090 Crohn's disease, unspecified, without complications: Secondary | ICD-10-CM | POA: Insufficient documentation

## 2011-02-18 LAB — CBC
HCT: 40.2 % (ref 36.0–46.0)
Hemoglobin: 13.4 g/dL (ref 12.0–15.0)
MCH: 28.7 pg (ref 26.0–34.0)
MCHC: 33.2 g/dL (ref 30.0–36.0)
MCV: 86.5 fL (ref 78.0–100.0)
Platelets: 341 10*3/uL (ref 150–400)
RBC: 4.65 MIL/uL (ref 3.87–5.11)
RDW: 15.1 % (ref 11.5–15.5)
WBC: 14 10*3/uL — ABNORMAL HIGH (ref 4.0–10.5)

## 2011-02-18 LAB — COMPREHENSIVE METABOLIC PANEL WITH GFR
ALT: 39 U/L — ABNORMAL HIGH (ref 0–35)
Alkaline Phosphatase: 145 U/L — ABNORMAL HIGH (ref 39–117)
BUN: 10 mg/dL (ref 6–23)
CO2: 20 meq/L (ref 19–32)
Calcium: 9.4 mg/dL (ref 8.4–10.5)
GFR calc non Af Amer: 45 mL/min — ABNORMAL LOW (ref >60)
Glucose, Bld: 89 mg/dL (ref 70–99)
Potassium: 2.6 meq/L — CL (ref 3.5–5.1)
Sodium: 142 meq/L (ref 135–145)

## 2011-02-18 LAB — COMPREHENSIVE METABOLIC PANEL
AST: 28 U/L (ref 0–37)
Albumin: 3.5 g/dL (ref 3.5–5.2)
Chloride: 116 mEq/L — ABNORMAL HIGH (ref 96–112)
Creatinine, Ser: 1.22 mg/dL — ABNORMAL HIGH (ref 0.4–1.2)
GFR calc Af Amer: 54 mL/min — ABNORMAL LOW (ref >60)
Total Bilirubin: 0.4 mg/dL (ref 0.3–1.2)
Total Protein: 8.4 g/dL — ABNORMAL HIGH (ref 6.0–8.3)

## 2011-02-18 LAB — DIFFERENTIAL
Basophils Absolute: 0 10*3/uL (ref 0.0–0.1)
Basophils Relative: 0 % (ref 0–1)
Eosinophils Absolute: 0.1 K/uL (ref 0.0–0.7)
Eosinophils Relative: 0 % (ref 0–5)
Lymphocytes Relative: 10 % — ABNORMAL LOW (ref 12–46)
Lymphs Abs: 1.3 10*3/uL (ref 0.7–4.0)
Monocytes Absolute: 0.6 10*3/uL (ref 0.1–1.0)
Monocytes Relative: 4 % (ref 3–12)
Neutro Abs: 12 K/uL — ABNORMAL HIGH (ref 1.7–7.7)
Neutrophils Relative %: 86 % — ABNORMAL HIGH (ref 43–77)

## 2011-02-22 LAB — CARDIAC PANEL(CRET KIN+CKTOT+MB+TROPI)
CK, MB: 3.8 ng/mL (ref 0.3–4.0)
CK, MB: 4.9 ng/mL — ABNORMAL HIGH (ref 0.3–4.0)
Relative Index: 2.5 (ref 0.0–2.5)
Total CK: 140 U/L (ref 7–177)
Total CK: 196 U/L — ABNORMAL HIGH (ref 7–177)
Troponin I: 0.04 ng/mL (ref 0.00–0.06)

## 2011-02-22 LAB — MAGNESIUM
Magnesium: 1.5 mg/dL (ref 1.5–2.5)
Magnesium: 1.7 mg/dL (ref 1.5–2.5)
Magnesium: 2.2 mg/dL (ref 1.5–2.5)

## 2011-02-22 LAB — CBC
HCT: 30.9 % — ABNORMAL LOW (ref 36.0–46.0)
HCT: 29.6 % — ABNORMAL LOW (ref 36.0–46.0)
HCT: 30 % — ABNORMAL LOW (ref 36.0–46.0)
HCT: 30.8 % — ABNORMAL LOW (ref 36.0–46.0)
HCT: 37.2 % (ref 36.0–46.0)
HCT: 30.5 % — ABNORMAL LOW (ref 36.0–46.0)
HCT: 32.4 % — ABNORMAL LOW (ref 36.0–46.0)
HCT: 41.2 % (ref 36.0–46.0)
Hemoglobin: 10.3 g/dL — ABNORMAL LOW (ref 12.0–15.0)
Hemoglobin: 10.1 g/dL — ABNORMAL LOW (ref 12.0–15.0)
Hemoglobin: 10.8 g/dL — ABNORMAL LOW (ref 12.0–15.0)
Hemoglobin: 10.2 g/dL — ABNORMAL LOW (ref 12.0–15.0)
Hemoglobin: 10.6 g/dL — ABNORMAL LOW (ref 12.0–15.0)
MCH: 29.6 pg (ref 26.0–34.0)
MCH: 30.1 pg (ref 26.0–34.0)
MCHC: 33.5 g/dL (ref 30.0–36.0)
MCHC: 33 g/dL (ref 30.0–36.0)
MCHC: 32.8 g/dL (ref 30.0–36.0)
MCHC: 33 g/dL (ref 30.0–36.0)
MCV: 89 fL (ref 78.0–100.0)
MCV: 86.1 fL (ref 78.0–100.0)
MCV: 87.4 fL (ref 78.0–100.0)
MCV: 88 fL (ref 78.0–100.0)
MCV: 88.3 fL (ref 78.0–100.0)
MCV: 89 fL (ref 78.0–100.0)
MCV: 89.7 fL (ref 78.0–100.0)
Platelets: 192 10*3/uL (ref 150–400)
Platelets: 261 10*3/uL (ref 150–400)
Platelets: 291 10*3/uL (ref 150–400)
Platelets: 304 10*3/uL (ref 150–400)
Platelets: 346 10*3/uL (ref 150–400)
Platelets: 423 10*3/uL — ABNORMAL HIGH (ref 150–400)
Platelets: 268 10*3/uL (ref 150–400)
Platelets: 290 10*3/uL (ref 150–400)
Platelets: 303 10*3/uL (ref 150–400)
Platelets: 213 10*3/uL (ref 150–400)
RBC: 3.48 MIL/uL — ABNORMAL LOW (ref 3.87–5.11)
RBC: 3.39 MIL/uL — ABNORMAL LOW (ref 3.87–5.11)
RBC: 4.44 MIL/uL (ref 3.87–5.11)
RBC: 3.55 MIL/uL — ABNORMAL LOW (ref 3.87–5.11)
RBC: 3.4 MIL/uL — ABNORMAL LOW (ref 3.87–5.11)
RBC: 3.59 MIL/uL — ABNORMAL LOW (ref 3.87–5.11)
RDW: 14.1 % (ref 11.5–15.5)
RDW: 15.5 % (ref 11.5–15.5)
RDW: 16.2 % — ABNORMAL HIGH (ref 11.5–15.5)
RDW: 16 % — ABNORMAL HIGH (ref 11.5–15.5)
RDW: 16.1 % — ABNORMAL HIGH (ref 11.5–15.5)
RDW: 13.2 % (ref 11.5–15.5)
RDW: 13.6 % (ref 11.5–15.5)
RDW: 13.9 % (ref 11.5–15.5)
WBC: 16.3 10*3/uL — ABNORMAL HIGH (ref 4.0–10.5)
WBC: 21.4 10*3/uL — ABNORMAL HIGH (ref 4.0–10.5)
WBC: 14.1 10*3/uL — ABNORMAL HIGH (ref 4.0–10.5)
WBC: 19.7 10*3/uL — ABNORMAL HIGH (ref 4.0–10.5)
WBC: 20.1 10*3/uL — ABNORMAL HIGH (ref 4.0–10.5)
WBC: 21.5 10*3/uL — ABNORMAL HIGH (ref 4.0–10.5)
WBC: 15.9 10*3/uL — ABNORMAL HIGH (ref 4.0–10.5)
WBC: 19.3 10*3/uL — ABNORMAL HIGH (ref 4.0–10.5)
WBC: 5.5 10*3/uL (ref 4.0–10.5)

## 2011-02-22 LAB — CULTURE, BLOOD (ROUTINE X 2)
Culture: NO GROWTH
Culture: NO GROWTH

## 2011-02-22 LAB — POTASSIUM: Potassium: 2.8 mEq/L — ABNORMAL LOW (ref 3.5–5.1)

## 2011-02-22 LAB — BASIC METABOLIC PANEL
BUN: 3 mg/dL — ABNORMAL LOW (ref 6–23)
BUN: 4 mg/dL — ABNORMAL LOW (ref 6–23)
BUN: 6 mg/dL (ref 6–23)
BUN: 12 mg/dL (ref 6–23)
BUN: 13 mg/dL (ref 6–23)
BUN: 3 mg/dL — ABNORMAL LOW (ref 6–23)
BUN: 5 mg/dL — ABNORMAL LOW (ref 6–23)
BUN: 6 mg/dL (ref 6–23)
BUN: 3 mg/dL — ABNORMAL LOW (ref 6–23)
BUN: 8 mg/dL (ref 6–23)
CO2: 15 mEq/L — ABNORMAL LOW (ref 19–32)
CO2: 15 mEq/L — ABNORMAL LOW (ref 19–32)
CO2: 16 mEq/L — ABNORMAL LOW (ref 19–32)
CO2: 17 mEq/L — ABNORMAL LOW (ref 19–32)
CO2: 18 mEq/L — ABNORMAL LOW (ref 19–32)
CO2: 14 mEq/L — ABNORMAL LOW (ref 19–32)
CO2: 24 mEq/L (ref 19–32)
CO2: 17 mEq/L — ABNORMAL LOW (ref 19–32)
CO2: 19 mEq/L (ref 19–32)
Calcium: 7.9 mg/dL — ABNORMAL LOW (ref 8.4–10.5)
Calcium: 8 mg/dL — ABNORMAL LOW (ref 8.4–10.5)
Calcium: 7.1 mg/dL — ABNORMAL LOW (ref 8.4–10.5)
Calcium: 7.4 mg/dL — ABNORMAL LOW (ref 8.4–10.5)
Calcium: 8.2 mg/dL — ABNORMAL LOW (ref 8.4–10.5)
Chloride: 110 mEq/L (ref 96–112)
Chloride: 112 mEq/L (ref 96–112)
Chloride: 114 mEq/L — ABNORMAL HIGH (ref 96–112)
Chloride: 119 mEq/L — ABNORMAL HIGH (ref 96–112)
Chloride: 122 mEq/L — ABNORMAL HIGH (ref 96–112)
Chloride: 111 mEq/L (ref 96–112)
Chloride: 107 mEq/L (ref 96–112)
Chloride: 109 mEq/L (ref 96–112)
Creatinine, Ser: 1.02 mg/dL (ref 0.4–1.2)
Creatinine, Ser: 1.11 mg/dL (ref 0.4–1.2)
Creatinine, Ser: 1.24 mg/dL — ABNORMAL HIGH (ref 0.4–1.2)
Creatinine, Ser: 1.28 mg/dL — ABNORMAL HIGH (ref 0.4–1.2)
Creatinine, Ser: 1 mg/dL (ref 0.4–1.2)
Creatinine, Ser: 1.14 mg/dL (ref 0.4–1.2)
Creatinine, Ser: 0.91 mg/dL (ref 0.4–1.2)
Creatinine, Ser: 1.05 mg/dL (ref 0.4–1.2)
Creatinine, Ser: 1.15 mg/dL (ref 0.4–1.2)
GFR calc Af Amer: >60 mL/min (ref >60)
GFR calc Af Amer: >60 mL/min (ref >60)
GFR calc Af Amer: >60 mL/min (ref >60)
GFR calc non Af Amer: >60 mL/min (ref >60)
GFR calc non Af Amer: 53 mL/min — ABNORMAL LOW (ref >60)
GFR calc non Af Amer: 56 mL/min — ABNORMAL LOW (ref >60)
GFR calc non Af Amer: 48 mL/min — ABNORMAL LOW (ref >60)
GFR calc non Af Amer: 56 mL/min — ABNORMAL LOW (ref >60)
GFR calc non Af Amer: 53 mL/min — ABNORMAL LOW (ref >60)
GFR calc non Af Amer: >60 mL/min (ref >60)
Glucose, Bld: 106 mg/dL — ABNORMAL HIGH (ref 70–99)
Glucose, Bld: 107 mg/dL — ABNORMAL HIGH (ref 70–99)
Glucose, Bld: 119 mg/dL — ABNORMAL HIGH (ref 70–99)
Glucose, Bld: 99 mg/dL (ref 70–99)
Glucose, Bld: 134 mg/dL — ABNORMAL HIGH (ref 70–99)
Glucose, Bld: 112 mg/dL — ABNORMAL HIGH (ref 70–99)
Glucose, Bld: 91 mg/dL (ref 70–99)
Glucose, Bld: 120 mg/dL — ABNORMAL HIGH (ref 70–99)
Potassium: 3.3 mEq/L — ABNORMAL LOW (ref 3.5–5.1)
Potassium: 4 mEq/L (ref 3.5–5.1)
Potassium: 3.4 mEq/L — ABNORMAL LOW (ref 3.5–5.1)
Potassium: 3.7 mEq/L (ref 3.5–5.1)
Potassium: 3.2 mEq/L — ABNORMAL LOW (ref 3.5–5.1)
Sodium: 136 mEq/L (ref 135–145)
Sodium: 137 mEq/L (ref 135–145)
Sodium: 139 mEq/L (ref 135–145)

## 2011-02-22 LAB — URINE MICROSCOPIC-ADD ON

## 2011-02-22 LAB — DIFFERENTIAL
Basophils Absolute: 0 10*3/uL (ref 0.0–0.1)
Basophils Absolute: 0 10*3/uL (ref 0.0–0.1)
Basophils Absolute: 0 10*3/uL (ref 0.0–0.1)
Basophils Relative: 0 % (ref 0–1)
Basophils Relative: 0 % (ref 0–1)
Basophils Relative: 0 % (ref 0–1)
Eosinophils Absolute: 0 10*3/uL (ref 0.0–0.7)
Eosinophils Absolute: 0.1 10*3/uL (ref 0.0–0.7)
Eosinophils Absolute: 0 10*3/uL (ref 0.0–0.7)
Eosinophils Absolute: 0 10*3/uL (ref 0.0–0.7)
Eosinophils Relative: 0 % (ref 0–5)
Eosinophils Relative: 0 % (ref 0–5)
Eosinophils Relative: 0 % (ref 0–5)
Lymphocytes Relative: 9 % — ABNORMAL LOW (ref 12–46)
Lymphs Abs: 1.2 10*3/uL (ref 0.7–4.0)
Monocytes Absolute: 0.4 10*3/uL (ref 0.1–1.0)
Monocytes Absolute: 0.2 10*3/uL (ref 0.1–1.0)
Neutrophils Relative %: 88 % — ABNORMAL HIGH (ref 43–77)

## 2011-02-22 LAB — COMPREHENSIVE METABOLIC PANEL
ALT: 21 U/L (ref 0–35)
ALT: 29 U/L (ref 0–35)
ALT: 39 U/L — ABNORMAL HIGH (ref 0–35)
ALT: 23 U/L (ref 0–35)
ALT: 37 U/L — ABNORMAL HIGH (ref 0–35)
AST: 27 U/L (ref 0–37)
AST: 54 U/L — ABNORMAL HIGH (ref 0–37)
AST: 23 U/L (ref 0–37)
Albumin: 2.7 g/dL — ABNORMAL LOW (ref 3.5–5.2)
Albumin: 3.6 g/dL (ref 3.5–5.2)
Alkaline Phosphatase: 102 U/L (ref 39–117)
BUN: 5 mg/dL — ABNORMAL LOW (ref 6–23)
CO2: 12 mEq/L — ABNORMAL LOW (ref 19–32)
CO2: 13 mEq/L — ABNORMAL LOW (ref 19–32)
CO2: 16 mEq/L — ABNORMAL LOW (ref 19–32)
CO2: 21 mEq/L (ref 19–32)
CO2: 21 mEq/L (ref 19–32)
Calcium: 7.4 mg/dL — ABNORMAL LOW (ref 8.4–10.5)
Calcium: 7.6 mg/dL — ABNORMAL LOW (ref 8.4–10.5)
Calcium: 8.9 mg/dL (ref 8.4–10.5)
Calcium: 8.9 mg/dL (ref 8.4–10.5)
Chloride: 110 mEq/L (ref 96–112)
Chloride: 120 mEq/L — ABNORMAL HIGH (ref 96–112)
Chloride: 113 mEq/L — ABNORMAL HIGH (ref 96–112)
Chloride: 117 mEq/L — ABNORMAL HIGH (ref 96–112)
GFR calc Af Amer: 47 mL/min — ABNORMAL LOW (ref >60)
GFR calc Af Amer: 52 mL/min — ABNORMAL LOW (ref >60)
GFR calc Af Amer: 56 mL/min — ABNORMAL LOW (ref >60)
GFR calc Af Amer: >60 mL/min (ref >60)
GFR calc non Af Amer: 39 mL/min — ABNORMAL LOW (ref >60)
GFR calc non Af Amer: 46 mL/min — ABNORMAL LOW (ref >60)
GFR calc non Af Amer: 54 mL/min — ABNORMAL LOW (ref >60)
Glucose, Bld: 132 mg/dL — ABNORMAL HIGH (ref 70–99)
Glucose, Bld: 80 mg/dL (ref 70–99)
Glucose, Bld: 85 mg/dL (ref 70–99)
Potassium: 3.5 mEq/L (ref 3.5–5.1)
Sodium: 135 mEq/L (ref 135–145)
Sodium: 137 mEq/L (ref 135–145)
Sodium: 141 mEq/L (ref 135–145)
Sodium: 142 mEq/L (ref 135–145)
Total Bilirubin: 0.3 mg/dL (ref 0.3–1.2)
Total Bilirubin: 0.7 mg/dL (ref 0.3–1.2)
Total Bilirubin: 0.5 mg/dL (ref 0.3–1.2)
Total Bilirubin: 0.5 mg/dL (ref 0.3–1.2)
Total Protein: 8.9 g/dL — ABNORMAL HIGH (ref 6.0–8.3)
Total Protein: 5.6 g/dL — ABNORMAL LOW (ref 6.0–8.3)

## 2011-02-22 LAB — LIPASE, BLOOD: Lipase: 54 U/L (ref 11–59)

## 2011-02-22 LAB — GLUCOSE, CAPILLARY
Glucose-Capillary: 122 mg/dL — ABNORMAL HIGH (ref 70–99)
Glucose-Capillary: 133 mg/dL — ABNORMAL HIGH (ref 70–99)
Glucose-Capillary: 134 mg/dL — ABNORMAL HIGH (ref 70–99)
Glucose-Capillary: 136 mg/dL — ABNORMAL HIGH (ref 70–99)
Glucose-Capillary: 138 mg/dL — ABNORMAL HIGH (ref 70–99)
Glucose-Capillary: 144 mg/dL — ABNORMAL HIGH (ref 70–99)
Glucose-Capillary: 148 mg/dL — ABNORMAL HIGH (ref 70–99)
Glucose-Capillary: 161 mg/dL — ABNORMAL HIGH (ref 70–99)
Glucose-Capillary: 163 mg/dL — ABNORMAL HIGH (ref 70–99)
Glucose-Capillary: 71 mg/dL (ref 70–99)
Glucose-Capillary: 88 mg/dL (ref 70–99)
Glucose-Capillary: 103 mg/dL — ABNORMAL HIGH (ref 70–99)
Glucose-Capillary: 112 mg/dL — ABNORMAL HIGH (ref 70–99)
Glucose-Capillary: 114 mg/dL — ABNORMAL HIGH (ref 70–99)
Glucose-Capillary: 116 mg/dL — ABNORMAL HIGH (ref 70–99)
Glucose-Capillary: 122 mg/dL — ABNORMAL HIGH (ref 70–99)
Glucose-Capillary: 128 mg/dL — ABNORMAL HIGH (ref 70–99)
Glucose-Capillary: 134 mg/dL — ABNORMAL HIGH (ref 70–99)
Glucose-Capillary: 84 mg/dL (ref 70–99)
Glucose-Capillary: 81 mg/dL (ref 70–99)

## 2011-02-22 LAB — URINE CULTURE

## 2011-02-22 LAB — POCT CARDIAC MARKERS
CKMB, poc: 1.2 ng/mL (ref 1.0–8.0)
Myoglobin, poc: 108 ng/mL (ref 12–200)

## 2011-02-22 LAB — POCT I-STAT, CHEM 8
Creatinine, Ser: 1.4 mg/dL — ABNORMAL HIGH (ref 0.4–1.2)
Hemoglobin: 15 g/dL (ref 12.0–15.0)
Potassium: 2.4 mEq/L — CL (ref 3.5–5.1)
Sodium: 141 mEq/L (ref 135–145)

## 2011-02-22 LAB — URINALYSIS, MICROSCOPIC ONLY
Glucose, UA: NEGATIVE mg/dL
Ketones, ur: NEGATIVE mg/dL
Nitrite: NEGATIVE
Protein, ur: 30 mg/dL — AB

## 2011-02-22 LAB — CHLORIDE, URINE, RANDOM: Chloride Urine: 211 mEq/L

## 2011-02-22 LAB — CK TOTAL AND CKMB (NOT AT ARMC)
CK, MB: 5.1 ng/mL — ABNORMAL HIGH (ref 0.3–4.0)
Relative Index: 3.3 — ABNORMAL HIGH (ref 0.0–2.5)
Total CK: 155 U/L (ref 7–177)

## 2011-02-22 LAB — URINALYSIS, ROUTINE W REFLEX MICROSCOPIC
Bilirubin Urine: NEGATIVE
Bilirubin Urine: NEGATIVE
Glucose, UA: NEGATIVE mg/dL
Hgb urine dipstick: NEGATIVE
Nitrite: NEGATIVE
Protein, ur: NEGATIVE mg/dL
Specific Gravity, Urine: 1.011 (ref 1.005–1.030)
Urobilinogen, UA: 0.2 mg/dL (ref 0.0–1.0)
pH: 6.5 (ref 5.0–8.0)

## 2011-02-22 LAB — NA AND K (SODIUM & POTASSIUM), RAND UR: Potassium Urine: 33 mEq/L

## 2011-02-22 LAB — POCT I-STAT 4, (NA,K, GLUC, HGB,HCT): Potassium: 3.3 mEq/L — ABNORMAL LOW (ref 3.5–5.1)

## 2011-02-22 LAB — HEPATIC FUNCTION PANEL
Bilirubin, Direct: 0.1 mg/dL (ref 0.0–0.3)
Indirect Bilirubin: 0.3 mg/dL (ref 0.3–0.9)

## 2011-02-22 LAB — BRAIN NATRIURETIC PEPTIDE: Pro B Natriuretic peptide (BNP): 189 pg/mL — ABNORMAL HIGH (ref 0.0–100.0)

## 2011-02-22 LAB — PHOSPHORUS: Phosphorus: 2.1 mg/dL — ABNORMAL LOW (ref 2.3–4.6)

## 2011-02-22 LAB — HEMOGLOBIN A1C: Hgb A1c MFr Bld: 5.7 % (ref 4.6–6.1)

## 2011-02-24 LAB — COMPREHENSIVE METABOLIC PANEL
ALT: 39 U/L — ABNORMAL HIGH (ref 0–35)
AST: 38 U/L — ABNORMAL HIGH (ref 0–37)
CO2: 20 mEq/L (ref 19–32)
Calcium: 8.8 mg/dL (ref 8.4–10.5)
GFR calc Af Amer: >60 mL/min (ref >60)
GFR calc non Af Amer: 57 mL/min — ABNORMAL LOW (ref >60)
Sodium: 137 mEq/L (ref 135–145)
Total Protein: 8.1 g/dL (ref 6.0–8.3)

## 2011-02-24 LAB — DIFFERENTIAL
Eosinophils Absolute: 0 10*3/uL (ref 0.0–0.7)
Eosinophils Relative: 1 % (ref 0–5)
Lymphs Abs: 2 10*3/uL (ref 0.7–4.0)
Monocytes Absolute: 0.5 10*3/uL (ref 0.1–1.0)
Monocytes Relative: 6 % (ref 3–12)

## 2011-02-24 LAB — CBC
MCHC: 33.6 g/dL (ref 30.0–36.0)
RBC: 4.28 MIL/uL (ref 3.87–5.11)

## 2011-02-25 LAB — DIFFERENTIAL
Basophils Absolute: 0 10*3/uL (ref 0.0–0.1)
Basophils Absolute: 0 10*3/uL (ref 0.0–0.1)
Basophils Relative: 0 % (ref 0–1)
Eosinophils Absolute: 0 10*3/uL (ref 0.0–0.7)
Eosinophils Absolute: 0 10*3/uL (ref 0.0–0.7)
Eosinophils Relative: 0 % (ref 0–5)
Lymphocytes Relative: 8 % — ABNORMAL LOW (ref 12–46)
Lymphs Abs: 1.1 10*3/uL (ref 0.7–4.0)
Lymphs Abs: 1.6 10*3/uL (ref 0.7–4.0)
Monocytes Relative: 0 % — ABNORMAL LOW (ref 3–12)
Monocytes Relative: 1 % — ABNORMAL LOW (ref 3–12)
Neutro Abs: 8.4 10*3/uL — ABNORMAL HIGH (ref 1.7–7.7)
Neutrophils Relative %: 84 % — ABNORMAL HIGH (ref 43–77)
Neutrophils Relative %: 90 % — ABNORMAL HIGH (ref 43–77)
Neutrophils Relative %: 91 % — ABNORMAL HIGH (ref 43–77)

## 2011-02-25 LAB — COMPREHENSIVE METABOLIC PANEL
ALT: 30 U/L (ref 0–35)
CO2: 15 mEq/L — ABNORMAL LOW (ref 19–32)
CO2: 18 mEq/L — ABNORMAL LOW (ref 19–32)
Calcium: 8.1 mg/dL — ABNORMAL LOW (ref 8.4–10.5)
Calcium: 8.9 mg/dL (ref 8.4–10.5)
Chloride: 106 mEq/L (ref 96–112)
Creatinine, Ser: 0.99 mg/dL (ref 0.4–1.2)
Creatinine, Ser: 1.21 mg/dL — ABNORMAL HIGH (ref 0.4–1.2)
GFR calc Af Amer: >60 mL/min (ref >60)
GFR calc non Af Amer: 45 mL/min — ABNORMAL LOW (ref >60)
GFR calc non Af Amer: 57 mL/min — ABNORMAL LOW (ref >60)
Glucose, Bld: 125 mg/dL — ABNORMAL HIGH (ref 70–99)
Glucose, Bld: 138 mg/dL — ABNORMAL HIGH (ref 70–99)
Sodium: 139 mEq/L (ref 135–145)
Total Bilirubin: 0.8 mg/dL (ref 0.3–1.2)
Total Protein: 6.2 g/dL (ref 6.0–8.3)

## 2011-02-25 LAB — CLOSTRIDIUM DIFFICILE EIA: C difficile Toxins A+B, EIA: NEGATIVE

## 2011-02-25 LAB — BASIC METABOLIC PANEL
BUN: 12 mg/dL (ref 6–23)
CO2: 14 mEq/L — ABNORMAL LOW (ref 19–32)
Calcium: 7.6 mg/dL — ABNORMAL LOW (ref 8.4–10.5)
Chloride: 115 mEq/L — ABNORMAL HIGH (ref 96–112)
Creatinine, Ser: 1.05 mg/dL (ref 0.4–1.2)
GFR calc Af Amer: >60 mL/min (ref >60)
Glucose, Bld: 157 mg/dL — ABNORMAL HIGH (ref 70–99)

## 2011-02-25 LAB — GLUCOSE, CAPILLARY
Glucose-Capillary: 124 mg/dL — ABNORMAL HIGH (ref 70–99)
Glucose-Capillary: 143 mg/dL — ABNORMAL HIGH (ref 70–99)

## 2011-02-25 LAB — CBC
Hemoglobin: 10.4 g/dL — ABNORMAL LOW (ref 12.0–15.0)
Hemoglobin: 14.6 g/dL (ref 12.0–15.0)
MCHC: 33.3 g/dL (ref 30.0–36.0)
MCHC: 33.4 g/dL (ref 30.0–36.0)
MCHC: 34 g/dL (ref 30.0–36.0)
MCV: 88.2 fL (ref 78.0–100.0)
MCV: 88.4 fL (ref 78.0–100.0)
MCV: 90.3 fL (ref 78.0–100.0)
Platelets: 312 10*3/uL (ref 150–400)
RBC: 3.45 MIL/uL — ABNORMAL LOW (ref 3.87–5.11)
RBC: 4.13 MIL/uL (ref 3.87–5.11)
RBC: 4.86 MIL/uL (ref 3.87–5.11)
RDW: 16.4 % — ABNORMAL HIGH (ref 11.5–15.5)
RDW: 16.5 % — ABNORMAL HIGH (ref 11.5–15.5)
WBC: 10.7 10*3/uL — ABNORMAL HIGH (ref 4.0–10.5)

## 2011-02-25 LAB — POTASSIUM
Potassium: 2.3 mEq/L — CL (ref 3.5–5.1)
Potassium: 2.8 mEq/L — ABNORMAL LOW (ref 3.5–5.1)

## 2011-02-25 LAB — STOOL CULTURE

## 2011-02-25 LAB — POCT CARDIAC MARKERS: Myoglobin, poc: >500 ng/mL (ref 12–200)

## 2011-02-25 LAB — MAGNESIUM: Magnesium: 2.2 mg/dL (ref 1.5–2.5)

## 2011-02-25 LAB — LIPASE, BLOOD: Lipase: 38 U/L (ref 11–59)

## 2011-03-01 LAB — COMPREHENSIVE METABOLIC PANEL
ALT: 16 U/L (ref 0–35)
ALT: 19 U/L (ref 0–35)
AST: 13 U/L (ref 0–37)
AST: 14 U/L (ref 0–37)
Albumin: 2.5 g/dL — ABNORMAL LOW (ref 3.5–5.2)
Albumin: 2.8 g/dL — ABNORMAL LOW (ref 3.5–5.2)
Alkaline Phosphatase: 117 U/L (ref 39–117)
Alkaline Phosphatase: 127 U/L — ABNORMAL HIGH (ref 39–117)
Alkaline Phosphatase: 98 U/L (ref 39–117)
BUN: 7 mg/dL (ref 6–23)
BUN: 9 mg/dL (ref 6–23)
CO2: 14 mEq/L — ABNORMAL LOW (ref 19–32)
CO2: 16 mEq/L — ABNORMAL LOW (ref 19–32)
CO2: 17 mEq/L — ABNORMAL LOW (ref 19–32)
Calcium: 7.3 mg/dL — ABNORMAL LOW (ref 8.4–10.5)
Calcium: 7.7 mg/dL — ABNORMAL LOW (ref 8.4–10.5)
Chloride: 115 mEq/L — ABNORMAL HIGH (ref 96–112)
Chloride: 119 mEq/L — ABNORMAL HIGH (ref 96–112)
Chloride: 120 mEq/L — ABNORMAL HIGH (ref 96–112)
Creatinine, Ser: 0.94 mg/dL (ref 0.4–1.2)
GFR calc Af Amer: >60 mL/min (ref >60)
GFR calc Af Amer: >60 mL/min (ref >60)
GFR calc non Af Amer: >60 mL/min (ref >60)
GFR calc non Af Amer: >60 mL/min (ref >60)
GFR calc non Af Amer: >60 mL/min (ref >60)
Glucose, Bld: 122 mg/dL — ABNORMAL HIGH (ref 70–99)
Glucose, Bld: 153 mg/dL — ABNORMAL HIGH (ref 70–99)
Glucose, Bld: 160 mg/dL — ABNORMAL HIGH (ref 70–99)
Potassium: 2.4 mEq/L — CL (ref 3.5–5.1)
Potassium: 2.8 mEq/L — ABNORMAL LOW (ref 3.5–5.1)
Potassium: 3.7 mEq/L (ref 3.5–5.1)
Sodium: 140 mEq/L (ref 135–145)
Sodium: 140 mEq/L (ref 135–145)
Sodium: 142 mEq/L (ref 135–145)
Total Bilirubin: 0.3 mg/dL (ref 0.3–1.2)
Total Bilirubin: 0.4 mg/dL (ref 0.3–1.2)
Total Protein: 5.4 g/dL — ABNORMAL LOW (ref 6.0–8.3)

## 2011-03-01 LAB — DIFFERENTIAL
Basophils Relative: 0 % (ref 0–1)
Eosinophils Absolute: 0 10*3/uL (ref 0.0–0.7)
Eosinophils Relative: 0 % (ref 0–5)
Neutrophils Relative %: 87 % — ABNORMAL HIGH (ref 43–77)

## 2011-03-01 LAB — GLUCOSE, CAPILLARY
Glucose-Capillary: 100 mg/dL — ABNORMAL HIGH (ref 70–99)
Glucose-Capillary: 100 mg/dL — ABNORMAL HIGH (ref 70–99)
Glucose-Capillary: 106 mg/dL — ABNORMAL HIGH (ref 70–99)
Glucose-Capillary: 117 mg/dL — ABNORMAL HIGH (ref 70–99)
Glucose-Capillary: 122 mg/dL — ABNORMAL HIGH (ref 70–99)
Glucose-Capillary: 141 mg/dL — ABNORMAL HIGH (ref 70–99)
Glucose-Capillary: 141 mg/dL — ABNORMAL HIGH (ref 70–99)
Glucose-Capillary: 85 mg/dL (ref 70–99)
Glucose-Capillary: 89 mg/dL (ref 70–99)

## 2011-03-01 LAB — BASIC METABOLIC PANEL
BUN: 6 mg/dL (ref 6–23)
BUN: 8 mg/dL (ref 6–23)
CO2: 20 mEq/L (ref 19–32)
CO2: 24 mEq/L (ref 19–32)
Chloride: 113 mEq/L — ABNORMAL HIGH (ref 96–112)
Chloride: 116 mEq/L — ABNORMAL HIGH (ref 96–112)
Creatinine, Ser: 0.92 mg/dL (ref 0.4–1.2)
Glucose, Bld: 90 mg/dL (ref 70–99)
Potassium: 2.8 mEq/L — ABNORMAL LOW (ref 3.5–5.1)
Potassium: 3 mEq/L — ABNORMAL LOW (ref 3.5–5.1)

## 2011-03-01 LAB — CBC
HCT: 30.2 % — ABNORMAL LOW (ref 36.0–46.0)
HCT: 30.9 % — ABNORMAL LOW (ref 36.0–46.0)
HCT: 31.9 % — ABNORMAL LOW (ref 36.0–46.0)
Hemoglobin: 10.3 g/dL — ABNORMAL LOW (ref 12.0–15.0)
Hemoglobin: 9.8 g/dL — ABNORMAL LOW (ref 12.0–15.0)
MCHC: 32.8 g/dL (ref 30.0–36.0)
MCHC: 33.4 g/dL (ref 30.0–36.0)
MCHC: 33.5 g/dL (ref 30.0–36.0)
MCV: 90.6 fL (ref 78.0–100.0)
MCV: 90.9 fL (ref 78.0–100.0)
MCV: 91.9 fL (ref 78.0–100.0)
Platelets: 225 10*3/uL (ref 150–400)
Platelets: 230 10*3/uL (ref 150–400)
RBC: 3.27 MIL/uL — ABNORMAL LOW (ref 3.87–5.11)
RBC: 3.29 MIL/uL — ABNORMAL LOW (ref 3.87–5.11)
RBC: 3.41 MIL/uL — ABNORMAL LOW (ref 3.87–5.11)
RBC: 3.47 MIL/uL — ABNORMAL LOW (ref 3.87–5.11)
WBC: 11.5 10*3/uL — ABNORMAL HIGH (ref 4.0–10.5)
WBC: 12.2 10*3/uL — ABNORMAL HIGH (ref 4.0–10.5)
WBC: 9.4 10*3/uL (ref 4.0–10.5)
WBC: 9.6 10*3/uL (ref 4.0–10.5)
WBC: 9.7 10*3/uL (ref 4.0–10.5)

## 2011-03-01 LAB — CLOSTRIDIUM DIFFICILE EIA

## 2011-03-01 LAB — MAGNESIUM: Magnesium: 1.6 mg/dL (ref 1.5–2.5)

## 2011-03-04 ENCOUNTER — Ambulatory Visit (HOSPITAL_COMMUNITY): Payer: Medicare Other

## 2011-03-04 ENCOUNTER — Encounter (HOSPITAL_COMMUNITY): Payer: Medicare Other

## 2011-03-10 ENCOUNTER — Encounter (HOSPITAL_COMMUNITY): Payer: Medicare Other

## 2011-03-10 ENCOUNTER — Encounter (HOSPITAL_COMMUNITY): Payer: Medicare Other | Attending: Oncology

## 2011-03-10 DIAGNOSIS — K5090 Crohn's disease, unspecified, without complications: Secondary | ICD-10-CM | POA: Insufficient documentation

## 2011-03-10 LAB — DIFFERENTIAL
Basophils Absolute: 0 10*3/uL (ref 0.0–0.1)
Eosinophils Absolute: 0.1 10*3/uL (ref 0.0–0.7)
Eosinophils Relative: 1 % (ref 0–5)
Monocytes Absolute: 0.6 10*3/uL (ref 0.1–1.0)

## 2011-03-10 LAB — COMPREHENSIVE METABOLIC PANEL
ALT: 25 U/L (ref 0–35)
AST: 24 U/L (ref 0–37)
Albumin: 3.5 g/dL (ref 3.5–5.2)
Alkaline Phosphatase: 168 U/L — ABNORMAL HIGH (ref 39–117)
CO2: 20 mEq/L (ref 19–32)
Chloride: 106 mEq/L (ref 96–112)
GFR calc Af Amer: 49 mL/min — ABNORMAL LOW (ref >60)
GFR calc non Af Amer: 40 mL/min — ABNORMAL LOW (ref >60)
Potassium: 2.2 mEq/L — CL (ref 3.5–5.1)
Sodium: 137 mEq/L (ref 135–145)
Total Bilirubin: 0.6 mg/dL (ref 0.3–1.2)

## 2011-03-10 LAB — URINALYSIS, ROUTINE W REFLEX MICROSCOPIC
Bilirubin Urine: NEGATIVE
Ketones, ur: NEGATIVE mg/dL
Nitrite: NEGATIVE
Protein, ur: 30 mg/dL — AB

## 2011-03-10 LAB — CBC
MCV: 86.7 fL (ref 78.0–100.0)
RBC: 4.8 MIL/uL (ref 3.87–5.11)

## 2011-03-10 LAB — URINE MICROSCOPIC-ADD ON

## 2011-03-10 LAB — POTASSIUM: Potassium: 2 mEq/L — CL (ref 3.5–5.1)

## 2011-03-10 LAB — URINE CULTURE: Colony Count: 90000

## 2011-03-14 LAB — COMPREHENSIVE METABOLIC PANEL
ALT: 16 U/L (ref 0–35)
ALT: 19 U/L (ref 0–35)
ALT: 26 U/L (ref 0–35)
AST: 22 U/L (ref 0–37)
BUN: 14 mg/dL (ref 6–23)
BUN: 13 mg/dL (ref 6–23)
CO2: 20 mEq/L (ref 19–32)
CO2: 19 mEq/L (ref 19–32)
Calcium: 9.3 mg/dL (ref 8.4–10.5)
Calcium: 8.8 mg/dL (ref 8.4–10.5)
Calcium: 9.3 mg/dL (ref 8.4–10.5)
Creatinine, Ser: 1.24 mg/dL — ABNORMAL HIGH (ref 0.4–1.2)
Creatinine, Ser: 1.15 mg/dL (ref 0.4–1.2)
GFR calc Af Amer: 54 mL/min — ABNORMAL LOW (ref >60)
GFR calc non Af Amer: 44 mL/min — ABNORMAL LOW (ref >60)
GFR calc non Af Amer: 48 mL/min — ABNORMAL LOW (ref >60)
Glucose, Bld: 90 mg/dL (ref 70–99)
Glucose, Bld: 113 mg/dL — ABNORMAL HIGH (ref 70–99)
Glucose, Bld: 119 mg/dL — ABNORMAL HIGH (ref 70–99)
Sodium: 140 mEq/L (ref 135–145)
Total Protein: 7 g/dL (ref 6.0–8.3)
Total Protein: 7.3 g/dL (ref 6.0–8.3)

## 2011-03-14 LAB — URINALYSIS, ROUTINE W REFLEX MICROSCOPIC
Glucose, UA: NEGATIVE mg/dL
Glucose, UA: NEGATIVE mg/dL
Ketones, ur: NEGATIVE mg/dL
Ketones, ur: NEGATIVE mg/dL
Ketones, ur: NEGATIVE mg/dL
Nitrite: NEGATIVE
Protein, ur: 30 mg/dL — AB
Protein, ur: 30 mg/dL — AB
Specific Gravity, Urine: 1.036 — ABNORMAL HIGH (ref 1.005–1.030)
Urobilinogen, UA: 0.2 mg/dL (ref 0.0–1.0)
pH: 6.5 (ref 5.0–8.0)

## 2011-03-14 LAB — POCT I-STAT, CHEM 8
Calcium, Ion: 1.16 mmol/L (ref 1.12–1.32)
Creatinine, Ser: 1.3 mg/dL — ABNORMAL HIGH (ref 0.4–1.2)
Glucose, Bld: 114 mg/dL — ABNORMAL HIGH (ref 70–99)
Hemoglobin: 14.3 g/dL (ref 12.0–15.0)
TCO2: 15 mmol/L (ref 0–100)

## 2011-03-14 LAB — URINE CULTURE: Colony Count: >=100000

## 2011-03-14 LAB — DIFFERENTIAL
Eosinophils Absolute: 0 10*3/uL (ref 0.0–0.7)
Eosinophils Absolute: 0 10*3/uL (ref 0.0–0.7)
Eosinophils Relative: 1 % (ref 0–5)
Eosinophils Relative: 0 % (ref 0–5)
Lymphocytes Relative: 19 % (ref 12–46)
Lymphs Abs: 0.9 10*3/uL (ref 0.7–4.0)
Lymphs Abs: 1.9 10*3/uL (ref 0.7–4.0)
Monocytes Absolute: 1 10*3/uL (ref 0.1–1.0)
Monocytes Relative: 0 % — ABNORMAL LOW (ref 3–12)
Monocytes Relative: 7 % (ref 3–12)
Neutrophils Relative %: 69 % (ref 43–77)
Neutrophils Relative %: 78 % — ABNORMAL HIGH (ref 43–77)

## 2011-03-14 LAB — MAGNESIUM: Magnesium: 1.8 mg/dL (ref 1.5–2.5)

## 2011-03-14 LAB — CBC
HCT: 38.2 % (ref 36.0–46.0)
HCT: 40.3 % (ref 36.0–46.0)
Hemoglobin: 12.8 g/dL (ref 12.0–15.0)
Hemoglobin: 11.4 g/dL — ABNORMAL LOW (ref 12.0–15.0)
MCHC: 33.5 g/dL (ref 30.0–36.0)
MCHC: 33.4 g/dL (ref 30.0–36.0)
MCHC: 33.7 g/dL (ref 30.0–36.0)
MCV: 93.4 fL (ref 78.0–100.0)
MCV: 94.8 fL (ref 78.0–100.0)
MCV: 95.6 fL (ref 78.0–100.0)
Platelets: 318 10*3/uL (ref 150–400)
RBC: 4.09 MIL/uL (ref 3.87–5.11)
RBC: 3.53 MIL/uL — ABNORMAL LOW (ref 3.87–5.11)
RBC: 4.25 MIL/uL (ref 3.87–5.11)
RDW: 14.6 % (ref 11.5–15.5)
RDW: 14.5 % (ref 11.5–15.5)
RDW: 14.7 % (ref 11.5–15.5)
WBC: 10.5 10*3/uL (ref 4.0–10.5)

## 2011-03-14 LAB — BASIC METABOLIC PANEL
BUN: 14 mg/dL (ref 6–23)
GFR calc non Af Amer: 47 mL/min — ABNORMAL LOW (ref >60)
Potassium: 3.7 mEq/L (ref 3.5–5.1)
Sodium: 138 mEq/L (ref 135–145)

## 2011-03-14 LAB — LIPASE, BLOOD: Lipase: 18 U/L (ref 11–59)

## 2011-03-14 LAB — URINE MICROSCOPIC-ADD ON

## 2011-03-16 LAB — URINALYSIS, ROUTINE W REFLEX MICROSCOPIC
Nitrite: NEGATIVE
Protein, ur: 100 mg/dL — AB
Urobilinogen, UA: 0.2 mg/dL (ref 0.0–1.0)

## 2011-03-16 LAB — CBC
HCT: 30.9 % — ABNORMAL LOW (ref 36.0–46.0)
HCT: 37.2 % (ref 36.0–46.0)
HCT: 39 % (ref 36.0–46.0)
Hemoglobin: 10.4 g/dL — ABNORMAL LOW (ref 12.0–15.0)
Hemoglobin: 12.8 g/dL (ref 12.0–15.0)
Hemoglobin: 13.1 g/dL (ref 12.0–15.0)
MCHC: 33.6 g/dL (ref 30.0–36.0)
MCHC: 34.3 g/dL (ref 30.0–36.0)
MCV: 102.5 fL — ABNORMAL HIGH (ref 78.0–100.0)
MCV: 102.8 fL — ABNORMAL HIGH (ref 78.0–100.0)
RBC: 3.22 MIL/uL — ABNORMAL LOW (ref 3.87–5.11)
RBC: 3.79 MIL/uL — ABNORMAL LOW (ref 3.87–5.11)
RDW: 15.6 % — ABNORMAL HIGH (ref 11.5–15.5)
RDW: 15.7 % — ABNORMAL HIGH (ref 11.5–15.5)
RDW: 15.9 % — ABNORMAL HIGH (ref 11.5–15.5)
WBC: 11.4 10*3/uL — ABNORMAL HIGH (ref 4.0–10.5)

## 2011-03-16 LAB — URINE MICROSCOPIC-ADD ON

## 2011-03-16 LAB — COMPREHENSIVE METABOLIC PANEL
ALT: 20 U/L (ref 0–35)
AST: 26 U/L (ref 0–37)
Albumin: 2.8 g/dL — ABNORMAL LOW (ref 3.5–5.2)
Albumin: 3.3 g/dL — ABNORMAL LOW (ref 3.5–5.2)
Alkaline Phosphatase: 91 U/L (ref 39–117)
BUN: 8 mg/dL (ref 6–23)
CO2: 22 mEq/L (ref 19–32)
Calcium: 8.5 mg/dL (ref 8.4–10.5)
Chloride: 111 mEq/L (ref 96–112)
Creatinine, Ser: 1.05 mg/dL (ref 0.4–1.2)
GFR calc Af Amer: >60 mL/min (ref >60)
GFR calc non Af Amer: 53 mL/min — ABNORMAL LOW (ref >60)
Glucose, Bld: 104 mg/dL — ABNORMAL HIGH (ref 70–99)
Potassium: 3.6 mEq/L (ref 3.5–5.1)
Sodium: 143 mEq/L (ref 135–145)
Total Bilirubin: 0.6 mg/dL (ref 0.3–1.2)
Total Protein: 6 g/dL (ref 6.0–8.3)

## 2011-03-16 LAB — BASIC METABOLIC PANEL
CO2: 19 mEq/L (ref 19–32)
CO2: 23 mEq/L (ref 19–32)
Calcium: 8.8 mg/dL (ref 8.4–10.5)
GFR calc non Af Amer: >60 mL/min (ref >60)
Glucose, Bld: 158 mg/dL — ABNORMAL HIGH (ref 70–99)
Glucose, Bld: 89 mg/dL (ref 70–99)
Potassium: 2.9 mEq/L — ABNORMAL LOW (ref 3.5–5.1)
Potassium: 4.3 mEq/L (ref 3.5–5.1)
Sodium: 138 mEq/L (ref 135–145)
Sodium: 142 mEq/L (ref 135–145)

## 2011-03-16 LAB — DIFFERENTIAL
Basophils Absolute: 0 10*3/uL (ref 0.0–0.1)
Basophils Relative: 0 % (ref 0–1)
Eosinophils Absolute: 0 10*3/uL (ref 0.0–0.7)
Eosinophils Relative: 0 % (ref 0–5)
Lymphs Abs: 0.7 10*3/uL (ref 0.7–4.0)
Monocytes Absolute: 0.1 10*3/uL (ref 0.1–1.0)
Monocytes Relative: 2 % — ABNORMAL LOW (ref 3–12)
Neutro Abs: 10.7 10*3/uL — ABNORMAL HIGH (ref 1.7–7.7)
Neutrophils Relative %: 94 % — ABNORMAL HIGH (ref 43–77)

## 2011-03-16 LAB — PHOSPHORUS: Phosphorus: 2.1 mg/dL — ABNORMAL LOW (ref 2.3–4.6)

## 2011-03-16 LAB — LIPASE, BLOOD: Lipase: 23 U/L (ref 11–59)

## 2011-03-16 LAB — URINE CULTURE: Colony Count: >=100000

## 2011-03-17 LAB — URINALYSIS, ROUTINE W REFLEX MICROSCOPIC
Glucose, UA: NEGATIVE mg/dL
Glucose, UA: NEGATIVE mg/dL
Glucose, UA: NEGATIVE mg/dL
Hgb urine dipstick: NEGATIVE
Ketones, ur: NEGATIVE mg/dL
Nitrite: NEGATIVE
Nitrite: NEGATIVE
Protein, ur: 100 mg/dL — AB
Specific Gravity, Urine: 1.026 (ref 1.005–1.030)
Specific Gravity, Urine: 1.033 — ABNORMAL HIGH (ref 1.005–1.030)
Urobilinogen, UA: 0.2 mg/dL (ref 0.0–1.0)
pH: 6.5 (ref 5.0–8.0)
pH: 6.5 (ref 5.0–8.0)
pH: 6 (ref 5.0–8.0)

## 2011-03-17 LAB — COMPREHENSIVE METABOLIC PANEL
ALT: 23 U/L (ref 0–35)
ALT: 28 U/L (ref 0–35)
AST: 31 U/L (ref 0–37)
AST: 29 U/L (ref 0–37)
Albumin: 4.1 g/dL (ref 3.5–5.2)
Alkaline Phosphatase: 135 U/L — ABNORMAL HIGH (ref 39–117)
BUN: 9 mg/dL (ref 6–23)
BUN: 9 mg/dL (ref 6–23)
BUN: 10 mg/dL (ref 6–23)
BUN: 8 mg/dL (ref 6–23)
CO2: 19 mEq/L (ref 19–32)
CO2: 23 mEq/L (ref 19–32)
Calcium: 9 mg/dL (ref 8.4–10.5)
Calcium: 9.1 mg/dL (ref 8.4–10.5)
Calcium: 9.5 mg/dL (ref 8.4–10.5)
Chloride: 104 mEq/L (ref 96–112)
Creatinine, Ser: 1.09 mg/dL (ref 0.4–1.2)
Creatinine, Ser: 1.13 mg/dL (ref 0.4–1.2)
Creatinine, Ser: 1.13 mg/dL (ref 0.4–1.2)
GFR calc Af Amer: 59 mL/min — ABNORMAL LOW (ref >60)
GFR calc Af Amer: 53 mL/min — ABNORMAL LOW (ref >60)
GFR calc non Af Amer: 49 mL/min — ABNORMAL LOW (ref >60)
GFR calc non Af Amer: 49 mL/min — ABNORMAL LOW (ref >60)
Glucose, Bld: 92 mg/dL (ref 70–99)
Glucose, Bld: 96 mg/dL (ref 70–99)
Glucose, Bld: 112 mg/dL — ABNORMAL HIGH (ref 70–99)
Potassium: 3.3 mEq/L — ABNORMAL LOW (ref 3.5–5.1)
Sodium: 141 mEq/L (ref 135–145)
Sodium: 137 mEq/L (ref 135–145)
Total Bilirubin: 0.6 mg/dL (ref 0.3–1.2)
Total Protein: 6.6 g/dL (ref 6.0–8.3)
Total Protein: 7.6 g/dL (ref 6.0–8.3)

## 2011-03-17 LAB — DIFFERENTIAL
Basophils Absolute: 0 10*3/uL (ref 0.0–0.1)
Basophils Relative: 1 % (ref 0–1)
Eosinophils Absolute: 0 10*3/uL (ref 0.0–0.7)
Eosinophils Absolute: 0 10*3/uL (ref 0.0–0.7)
Eosinophils Relative: 1 % (ref 0–5)
Lymphocytes Relative: 34 % (ref 12–46)
Lymphocytes Relative: 36 % (ref 12–46)
Lymphocytes Relative: 10 % — ABNORMAL LOW (ref 12–46)
Lymphs Abs: 1.6 10*3/uL (ref 0.7–4.0)
Lymphs Abs: 1.9 10*3/uL (ref 0.7–4.0)
Lymphs Abs: 0.5 10*3/uL — ABNORMAL LOW (ref 0.7–4.0)
Monocytes Absolute: 0.5 10*3/uL (ref 0.1–1.0)
Monocytes Relative: 10 % (ref 3–12)
Neutro Abs: 2.8 10*3/uL (ref 1.7–7.7)
Neutro Abs: 3 10*3/uL (ref 1.7–7.7)
Neutro Abs: 4.2 10*3/uL (ref 1.7–7.7)
Neutrophils Relative %: 49 % (ref 43–77)
Neutrophils Relative %: 53 % (ref 43–77)
Neutrophils Relative %: 87 % — ABNORMAL HIGH (ref 43–77)

## 2011-03-17 LAB — CBC
HCT: 34 % — ABNORMAL LOW (ref 36.0–46.0)
Hemoglobin: 11.2 g/dL — ABNORMAL LOW (ref 12.0–15.0)
Hemoglobin: 11.3 g/dL — ABNORMAL LOW (ref 12.0–15.0)
Hemoglobin: 11.4 g/dL — ABNORMAL LOW (ref 12.0–15.0)
MCHC: 34.1 g/dL (ref 30.0–36.0)
MCHC: 34.1 g/dL (ref 30.0–36.0)
MCHC: 35.4 g/dL (ref 30.0–36.0)
MCHC: 33.8 g/dL (ref 30.0–36.0)
MCV: 103.9 fL — ABNORMAL HIGH (ref 78.0–100.0)
MCV: 106.4 fL — ABNORMAL HIGH (ref 78.0–100.0)
Platelets: 370 10*3/uL (ref 150–400)
RBC: 3.18 MIL/uL — ABNORMAL LOW (ref 3.87–5.11)
RBC: 3.27 MIL/uL — ABNORMAL LOW (ref 3.87–5.11)
RBC: 3.16 MIL/uL — ABNORMAL LOW (ref 3.87–5.11)
RDW: 15.9 % — ABNORMAL HIGH (ref 11.5–15.5)
RDW: 17.4 % — ABNORMAL HIGH (ref 11.5–15.5)
WBC: 4.2 10*3/uL (ref 4.0–10.5)
WBC: 4.9 10*3/uL (ref 4.0–10.5)

## 2011-03-17 LAB — URINE MICROSCOPIC-ADD ON

## 2011-03-17 LAB — BASIC METABOLIC PANEL
CO2: 19 mEq/L (ref 19–32)
Calcium: 8.8 mg/dL (ref 8.4–10.5)
Chloride: 111 mEq/L (ref 96–112)
Creatinine, Ser: 1.07 mg/dL (ref 0.4–1.2)
GFR calc Af Amer: >60 mL/min (ref >60)
Sodium: 137 mEq/L (ref 135–145)

## 2011-03-17 LAB — URINE CULTURE

## 2011-03-17 LAB — LIPASE, BLOOD: Lipase: 30 U/L (ref 11–59)

## 2011-03-19 LAB — PROTIME-INR
INR: 1 (ref 0.00–1.49)
Prothrombin Time: 13.9 seconds (ref 11.6–15.2)

## 2011-03-19 LAB — URINALYSIS, ROUTINE W REFLEX MICROSCOPIC
Protein, ur: 30 mg/dL — AB
Urobilinogen, UA: 0.2 mg/dL (ref 0.0–1.0)

## 2011-03-19 LAB — URINE CULTURE
Colony Count: 85000
Colony Count: >=100000
Colony Count: >=100000

## 2011-03-19 LAB — COMPREHENSIVE METABOLIC PANEL
ALT: 31 U/L (ref 0–35)
AST: 25 U/L (ref 0–37)
Albumin: 3.7 g/dL (ref 3.5–5.2)
Albumin: 3.9 g/dL (ref 3.5–5.2)
BUN: 11 mg/dL (ref 6–23)
BUN: 12 mg/dL (ref 6–23)
CO2: 22 mEq/L (ref 19–32)
Calcium: 9.3 mg/dL (ref 8.4–10.5)
Chloride: 105 mEq/L (ref 96–112)
Chloride: 105 mEq/L (ref 96–112)
Creatinine, Ser: 1.03 mg/dL (ref 0.4–1.2)
Creatinine, Ser: 1.17 mg/dL (ref 0.4–1.2)
GFR calc Af Amer: 56 mL/min — ABNORMAL LOW (ref >60)
GFR calc non Af Amer: 46 mL/min — ABNORMAL LOW (ref >60)
GFR calc non Af Amer: 47 mL/min — ABNORMAL LOW (ref >60)
Glucose, Bld: 111 mg/dL — ABNORMAL HIGH (ref 70–99)
Glucose, Bld: 140 mg/dL — ABNORMAL HIGH (ref 70–99)
Potassium: 4.4 mEq/L (ref 3.5–5.1)
Sodium: 137 mEq/L (ref 135–145)
Total Bilirubin: 0.5 mg/dL (ref 0.3–1.2)
Total Bilirubin: 0.8 mg/dL (ref 0.3–1.2)
Total Protein: 6.5 g/dL (ref 6.0–8.3)

## 2011-03-19 LAB — DIFFERENTIAL
Basophils Absolute: 0 10*3/uL (ref 0.0–0.1)
Basophils Relative: 0 % (ref 0–1)
Eosinophils Absolute: 0 10*3/uL (ref 0.0–0.7)
Lymphocytes Relative: 6 % — ABNORMAL LOW (ref 12–46)
Monocytes Relative: 2 % — ABNORMAL LOW (ref 3–12)
Neutro Abs: 7.1 10*3/uL (ref 1.7–7.7)
Neutrophils Relative %: 92 % — ABNORMAL HIGH (ref 43–77)

## 2011-03-19 LAB — URINE DRUGS OF ABUSE SCREEN W ALC, ROUTINE (REF LAB)
Barbiturate Quant, Ur: NEGATIVE
Benzodiazepines.: NEGATIVE
Creatinine,U: 92.8 mg/dL
Marijuana Metabolite: NEGATIVE
Methadone: NEGATIVE
Phencyclidine (PCP): NEGATIVE

## 2011-03-19 LAB — MAGNESIUM: Magnesium: 1.9 mg/dL (ref 1.5–2.5)

## 2011-03-19 LAB — CBC
HCT: 27.6 % — ABNORMAL LOW (ref 36.0–46.0)
HCT: 35.3 % — ABNORMAL LOW (ref 36.0–46.0)
Hemoglobin: 10.8 g/dL — ABNORMAL LOW (ref 12.0–15.0)
Hemoglobin: 9.7 g/dL — ABNORMAL LOW (ref 12.0–15.0)
MCV: 99.6 fL (ref 78.0–100.0)
Platelets: 219 10*3/uL (ref 150–400)
Platelets: 276 10*3/uL (ref 150–400)
Platelets: 351 10*3/uL (ref 150–400)
RBC: 2.65 MIL/uL — ABNORMAL LOW (ref 3.87–5.11)
RBC: 2.78 MIL/uL — ABNORMAL LOW (ref 3.87–5.11)
RBC: 3.54 MIL/uL — ABNORMAL LOW (ref 3.87–5.11)
RBC: 3.81 MIL/uL — ABNORMAL LOW (ref 3.87–5.11)
RDW: 23.9 % — ABNORMAL HIGH (ref 11.5–15.5)
WBC: 4 10*3/uL (ref 4.0–10.5)
WBC: 4.6 10*3/uL (ref 4.0–10.5)
WBC: 6.6 10*3/uL (ref 4.0–10.5)
WBC: 7.8 10*3/uL (ref 4.0–10.5)

## 2011-03-19 LAB — CK TOTAL AND CKMB (NOT AT ARMC)
CK, MB: 3.4 ng/mL (ref 0.3–4.0)
CK, MB: 3.4 ng/mL (ref 0.3–4.0)
Relative Index: INVALID (ref 0.0–2.5)
Total CK: 70 U/L (ref 7–177)

## 2011-03-19 LAB — PHOSPHORUS: Phosphorus: 4.5 mg/dL (ref 2.3–4.6)

## 2011-03-19 LAB — BASIC METABOLIC PANEL
GFR calc Af Amer: 57 mL/min — ABNORMAL LOW (ref >60)
GFR calc non Af Amer: 47 mL/min — ABNORMAL LOW (ref >60)
Glucose, Bld: 92 mg/dL (ref 70–99)
Potassium: 4 mEq/L (ref 3.5–5.1)
Sodium: 140 mEq/L (ref 135–145)

## 2011-03-19 LAB — URINE MICROSCOPIC-ADD ON

## 2011-03-19 LAB — LIPID PANEL
HDL: 92 mg/dL (ref >39)
Total CHOL/HDL Ratio: 1.8 RATIO

## 2011-03-19 LAB — LIPASE, BLOOD
Lipase: 24 U/L (ref 11–59)
Lipase: 95 U/L — ABNORMAL HIGH (ref 11–59)

## 2011-03-19 LAB — LACTIC ACID, PLASMA: Lactic Acid, Venous: 0.8 mmol/L (ref 0.5–2.2)

## 2011-03-19 LAB — BRAIN NATRIURETIC PEPTIDE: Pro B Natriuretic peptide (BNP): <30 pg/mL (ref 0.0–100.0)

## 2011-04-02 ENCOUNTER — Emergency Department (HOSPITAL_COMMUNITY)
Admission: EM | Admit: 2011-04-02 | Discharge: 2011-04-02 | Disposition: A | Discharge status: Home / Self Care | Payer: Medicare Other | Attending: Emergency Medicine | Admitting: Emergency Medicine

## 2011-04-02 ENCOUNTER — Emergency Department (HOSPITAL_COMMUNITY): Payer: Medicare Other

## 2011-04-02 DIAGNOSIS — R109 Unspecified abdominal pain: Secondary | ICD-10-CM | POA: Insufficient documentation

## 2011-04-02 DIAGNOSIS — R112 Nausea with vomiting, unspecified: Secondary | ICD-10-CM | POA: Insufficient documentation

## 2011-04-02 DIAGNOSIS — Z9889 Other specified postprocedural states: Secondary | ICD-10-CM | POA: Insufficient documentation

## 2011-04-02 DIAGNOSIS — R197 Diarrhea, unspecified: Secondary | ICD-10-CM | POA: Insufficient documentation

## 2011-04-02 DIAGNOSIS — K449 Diaphragmatic hernia without obstruction or gangrene: Secondary | ICD-10-CM | POA: Insufficient documentation

## 2011-04-02 DIAGNOSIS — Z9089 Acquired absence of other organs: Secondary | ICD-10-CM | POA: Insufficient documentation

## 2011-04-02 LAB — COMPREHENSIVE METABOLIC PANEL
ALT: 35 U/L (ref 0–35)
AST: 29 U/L (ref 0–37)
Alkaline Phosphatase: 128 U/L — ABNORMAL HIGH (ref 39–117)
CO2: 15 mEq/L — ABNORMAL LOW (ref 19–32)
Chloride: 114 mEq/L — ABNORMAL HIGH (ref 96–112)
GFR calc Af Amer: 58 mL/min — ABNORMAL LOW (ref >60)
GFR calc non Af Amer: 48 mL/min — ABNORMAL LOW (ref >60)
Glucose, Bld: 125 mg/dL — ABNORMAL HIGH (ref 70–99)
Sodium: 139 mEq/L (ref 135–145)
Total Bilirubin: 0.5 mg/dL (ref 0.3–1.2)

## 2011-04-02 LAB — URINALYSIS, ROUTINE W REFLEX MICROSCOPIC
Glucose, UA: NEGATIVE mg/dL
Hgb urine dipstick: NEGATIVE
Ketones, ur: NEGATIVE mg/dL
Protein, ur: 30 mg/dL — AB
Urobilinogen, UA: 0.2 mg/dL (ref 0.0–1.0)

## 2011-04-02 LAB — CBC
HCT: 38.2 % (ref 36.0–46.0)
Hemoglobin: 12.7 g/dL (ref 12.0–15.0)
MCH: 27.6 pg (ref 26.0–34.0)
MCHC: 33.2 g/dL (ref 30.0–36.0)
RDW: 14.6 % (ref 11.5–15.5)

## 2011-04-02 LAB — DIFFERENTIAL
Basophils Absolute: 0 10*3/uL (ref 0.0–0.1)
Basophils Relative: 0 % (ref 0–1)
Eosinophils Relative: 0 % (ref 0–5)
Monocytes Absolute: 0.1 10*3/uL (ref 0.1–1.0)
Monocytes Relative: 1 % — ABNORMAL LOW (ref 3–12)

## 2011-04-02 LAB — URINE MICROSCOPIC-ADD ON

## 2011-04-02 LAB — OCCULT BLOOD, POC DEVICE: Fecal Occult Bld: POSITIVE

## 2011-04-02 MED ORDER — IOHEXOL 300 MG/ML  SOLN
100.0000 mL | Freq: Once | INTRAMUSCULAR | Status: AC | PRN
  Administered 2011-04-02: 100 mL via INTRAVENOUS

## 2011-04-03 LAB — URINE CULTURE

## 2011-04-10 IMAGING — CR DG ABDOMEN ACUTE W/ 1V CHEST
3 series · 3 of 3 positions shown · non-contrast
Comparison: 12/20/2009

CLINICAL DATA: Crohn's disease.  Abdominal pain.

ACUTE ABDOMEN SERIES (ABDOMEN 2 VIEW & CHEST 1 VIEW)

[w chest pa]
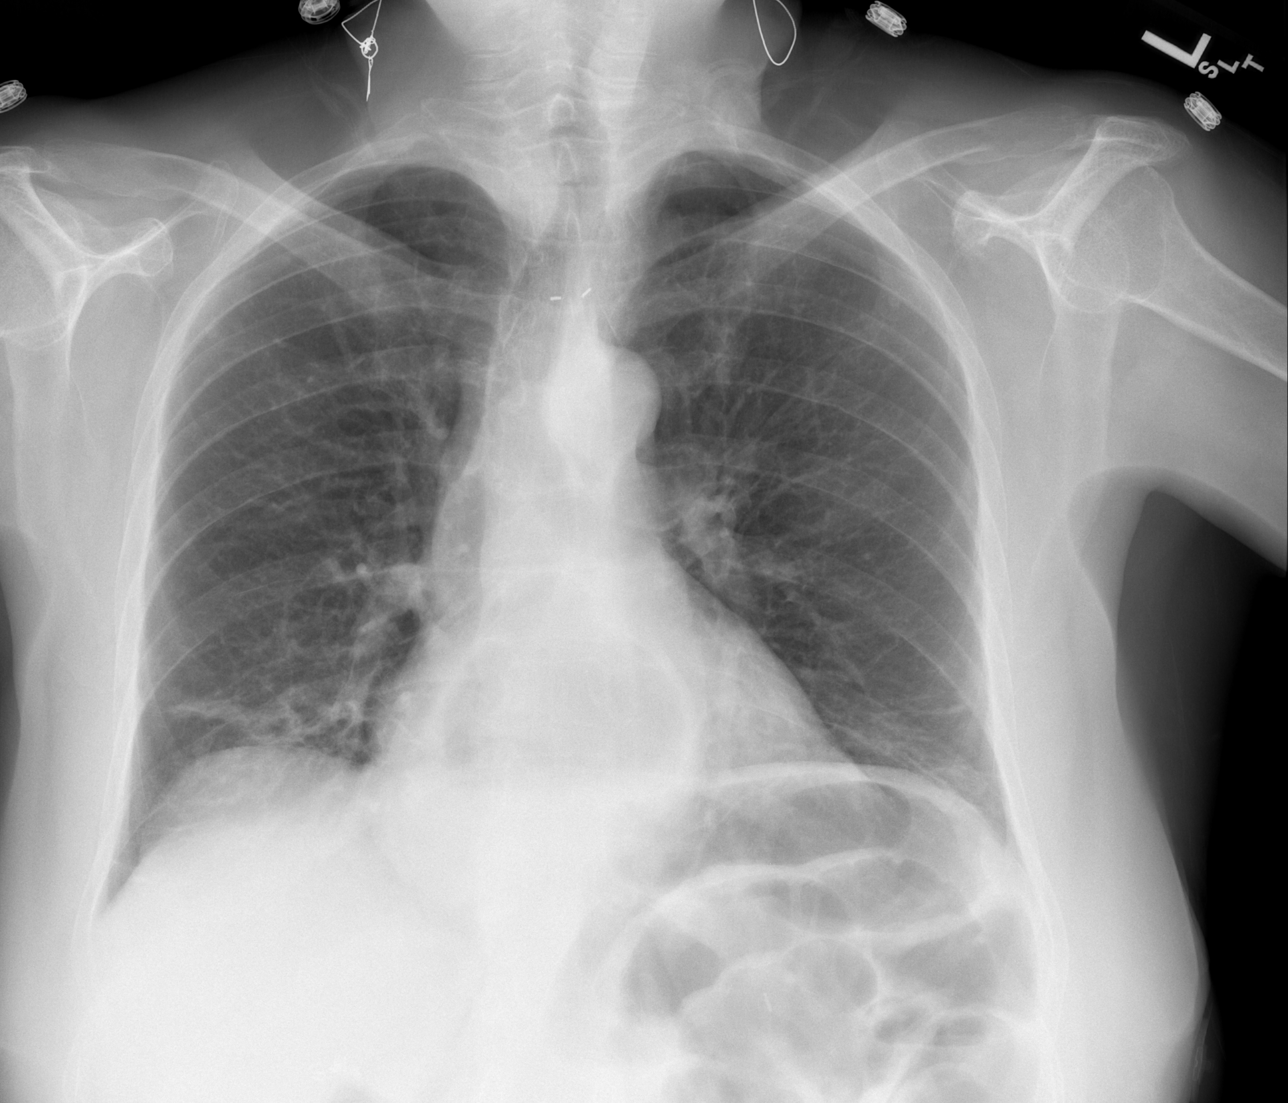

[w abdomen upright]
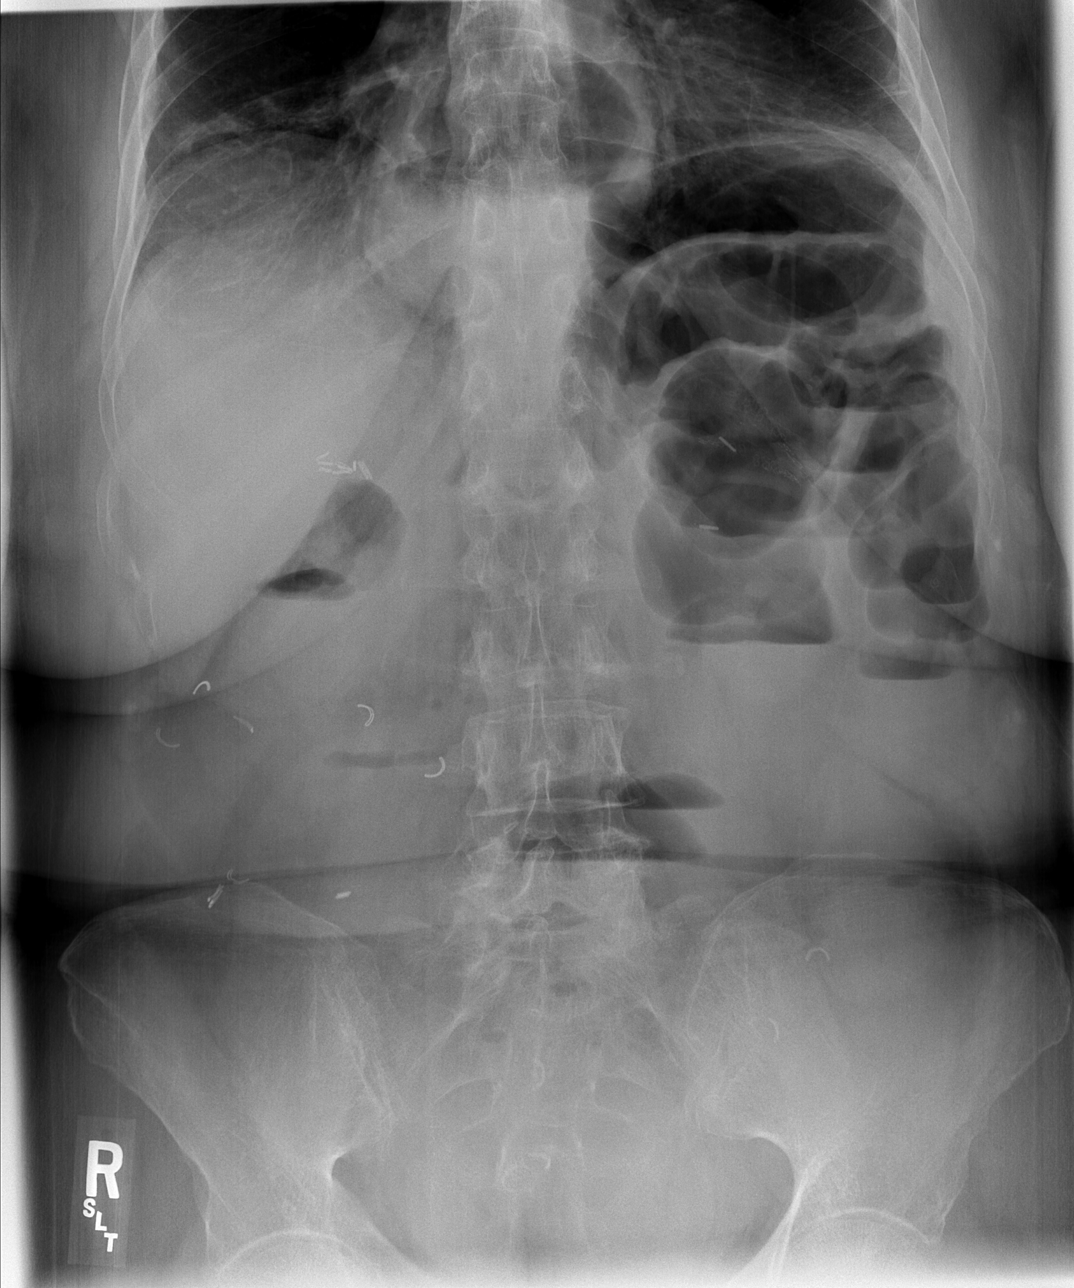

[t abdomen supine]
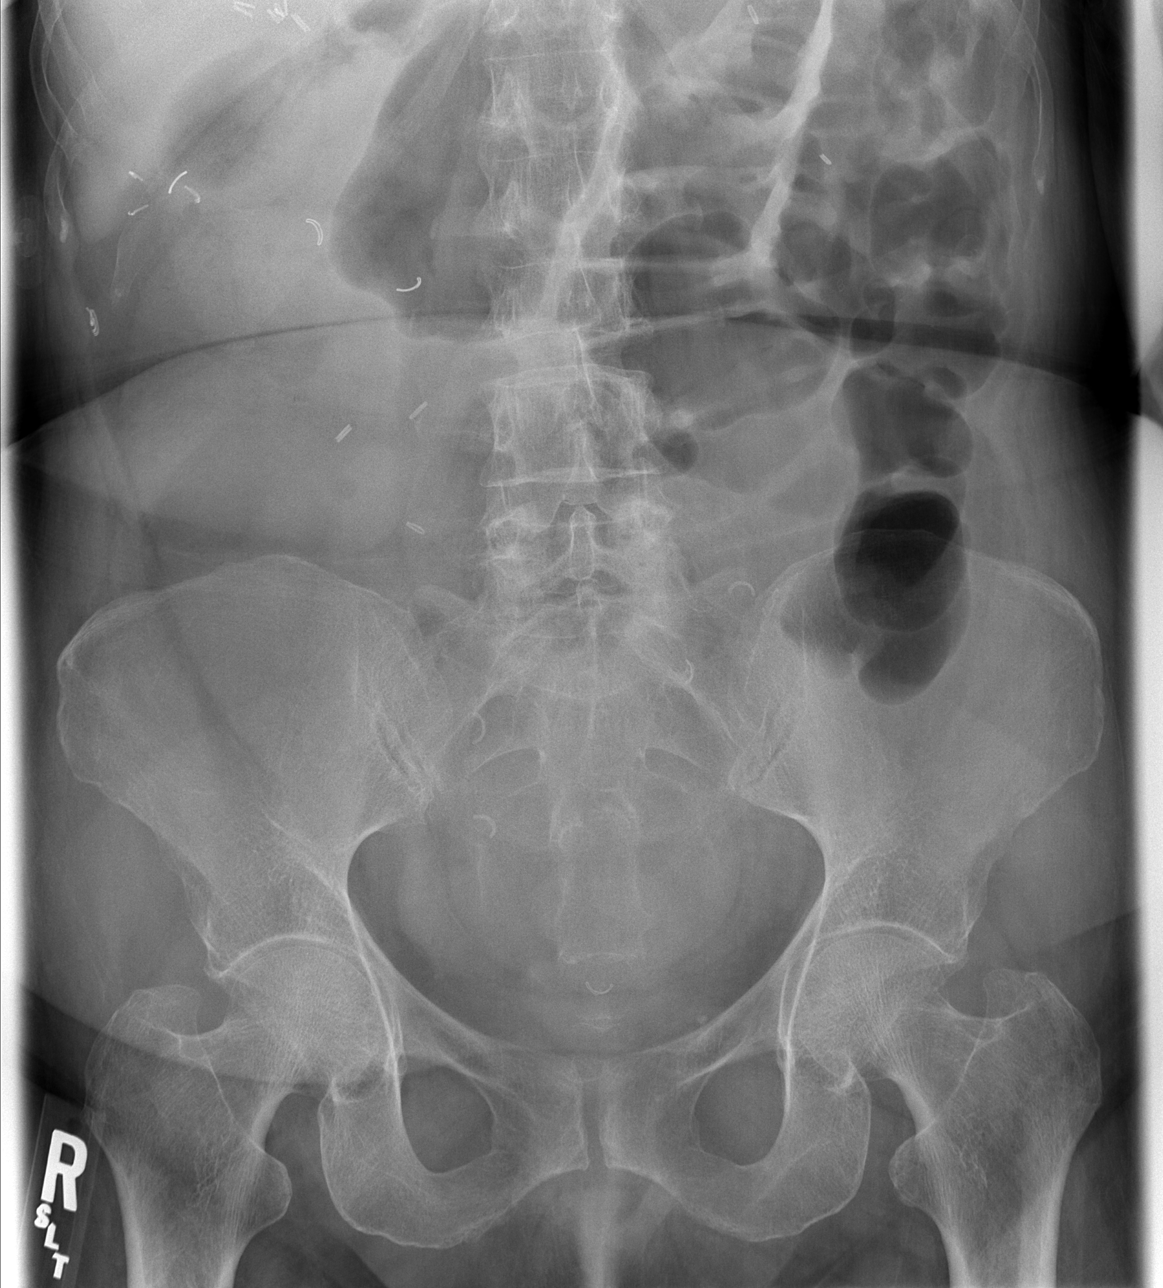

[3 of 3 positions shown; findings below may reference images not displayed]

FINDINGS: Heart size is normal.

There is plate-like atelectasis identified within both lung bases
right greater than left.

Hiatal hernia is noted.

There are multiple air-fluid levels identified within the abdomen.

Dilated loops of bowel within the left upper quadrant of the
abdomen are noted measuring up to 5.7 cm.

There is a a paucity of rectal gas.
IMPRESSION: 1.  Dilated loops of bowel and air-fluid levels in this patient who
has a history of partial colonic resection and Crohn's disease.
Bowel obstruction cannot be excluded.
2.  Hiatal hernia
3.  Bibasilar pulmonary atelectasis.

## 2011-04-11 IMAGING — CT CT ABD-PELV W/ CM
2 of 5 series · 16 of 46 positions shown, 18 images · IV contrast (APPLIED)
Comparison: 03/27/2010 radiographs; CT from 12/17/2009

CLINICAL DATA: Lower abdominal pain.  Chest pain.  History of
Crohn's disease.

CT ABDOMEN AND PELVIS WITH CONTRAST
TECHNIQUE: Multidetector CT imaging of the abdomen and pelvis was
performed following the standard protocol during bolus
administration of intravenous contrast.
Contrast: 80 ml Dmnipaque-1GG

[Series 2: abd/pelv with 5.0 b31f st · axial · 0.66mm/px · z∈[-516,-126]mm · 13 of 88 slices shown, 15 images]
[im 5/88  soft-tissue]
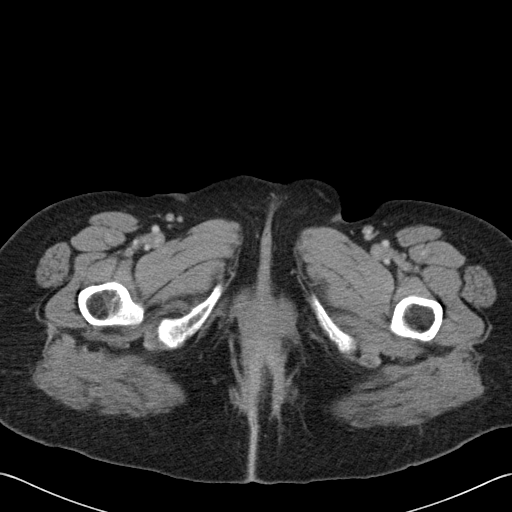
[im 5/88  bone]
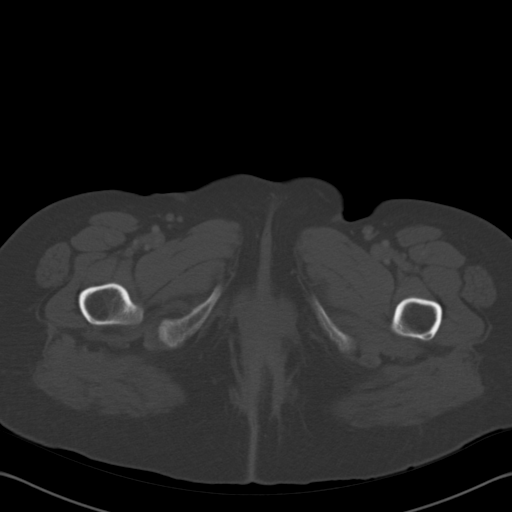
[im 14/88  soft-tissue]
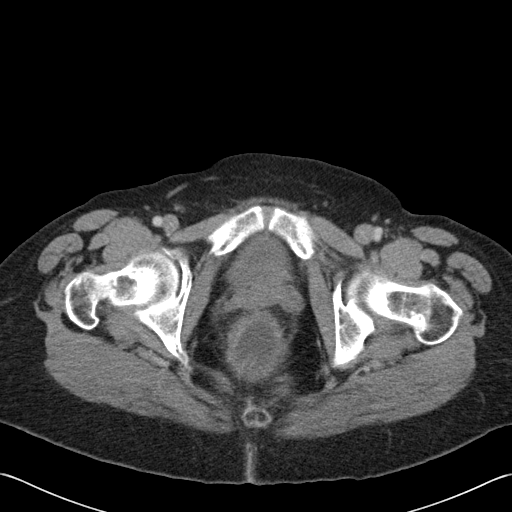
[im 18/88  soft-tissue]
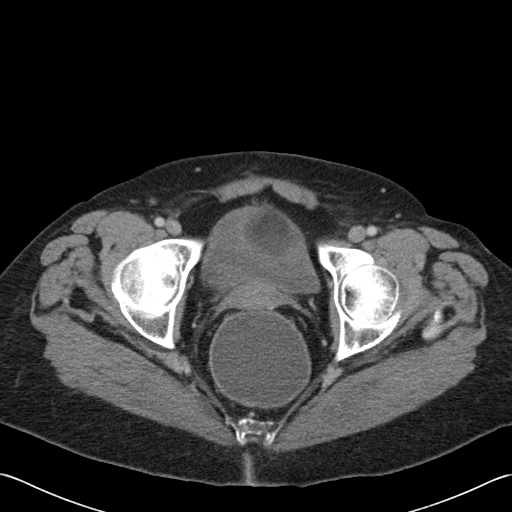
[im 27/88  soft-tissue]
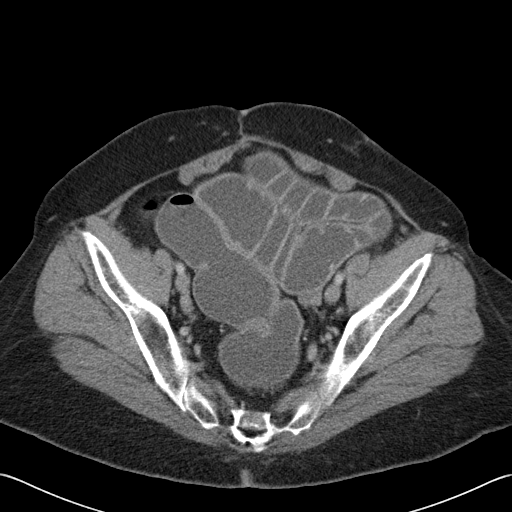
[im 31/88  soft-tissue]
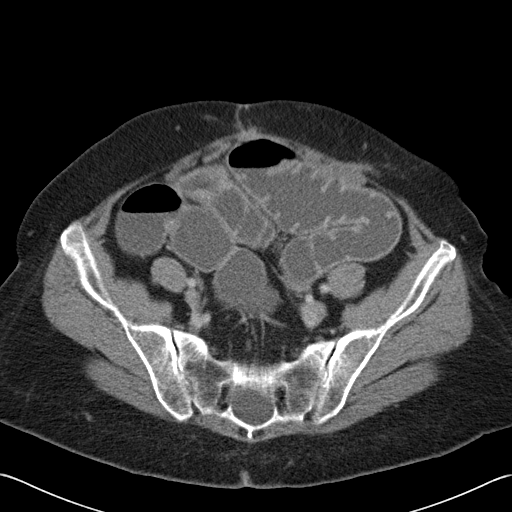
[im 40/88  soft-tissue]
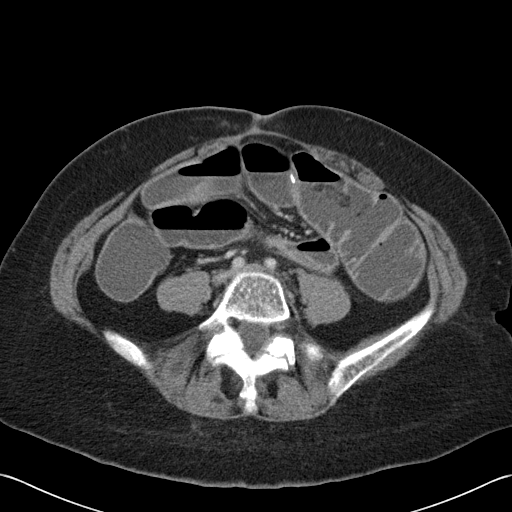
[im 44/88  soft-tissue]
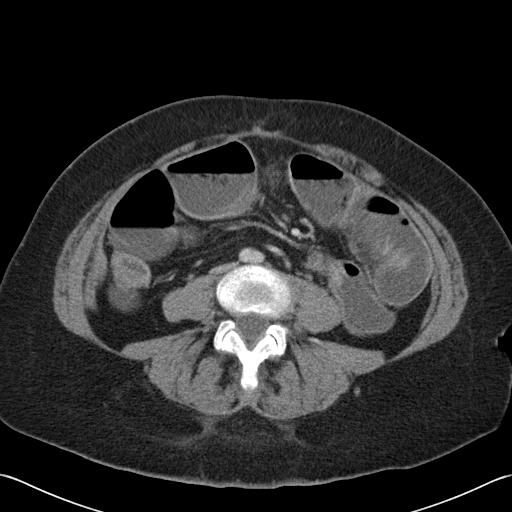
[im 48/88  soft-tissue]
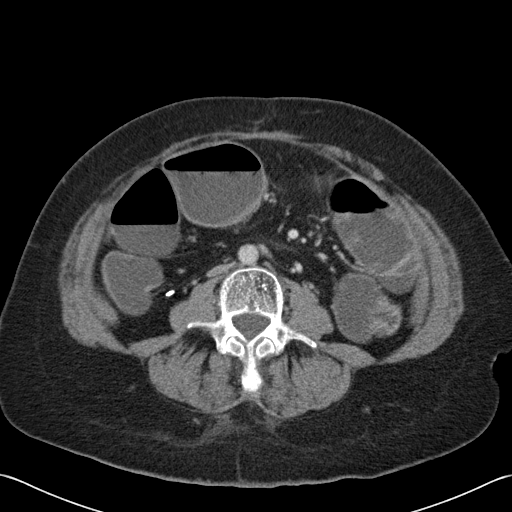
[im 57/88  soft-tissue]
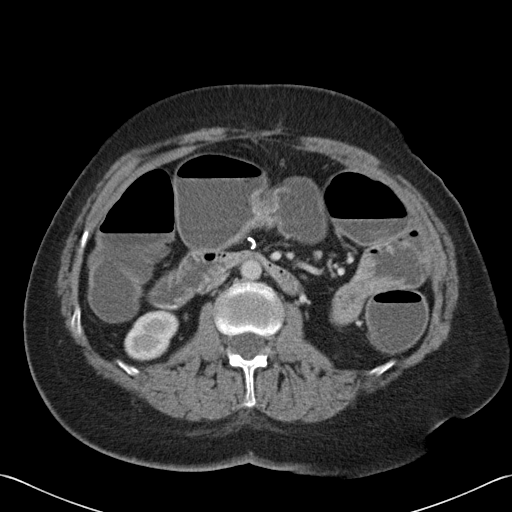
[im 57/88  bone]
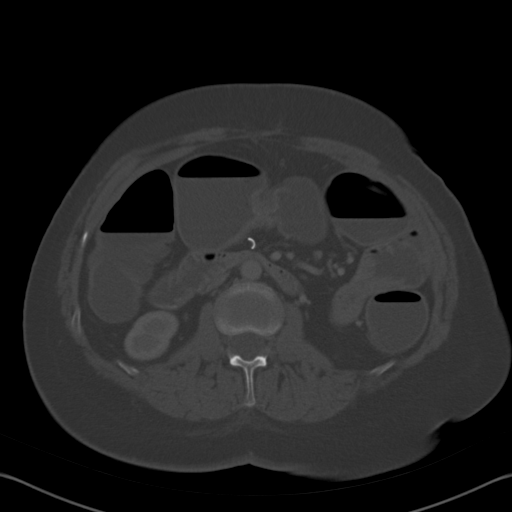
[im 61/88  soft-tissue]
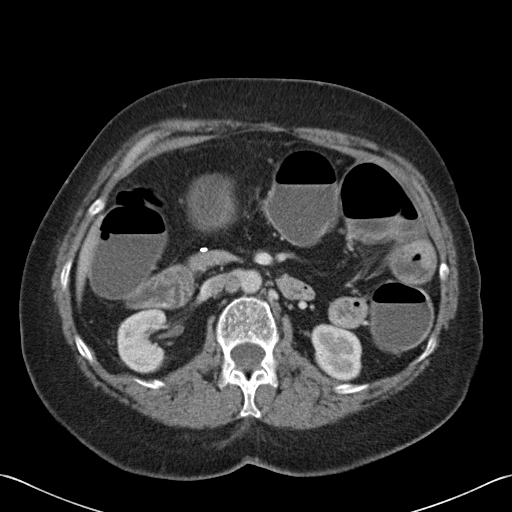
[im 70/88  soft-tissue]
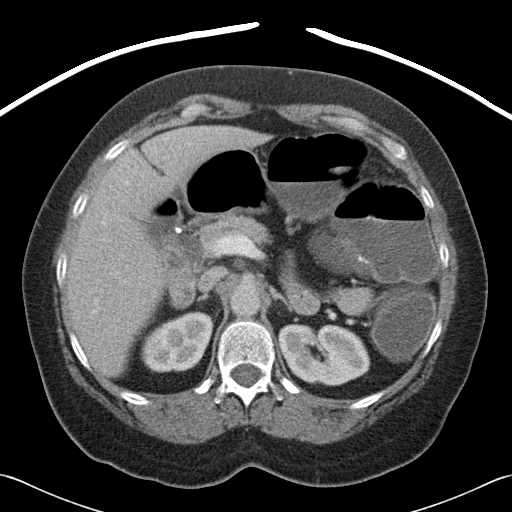
[im 74/88  soft-tissue]
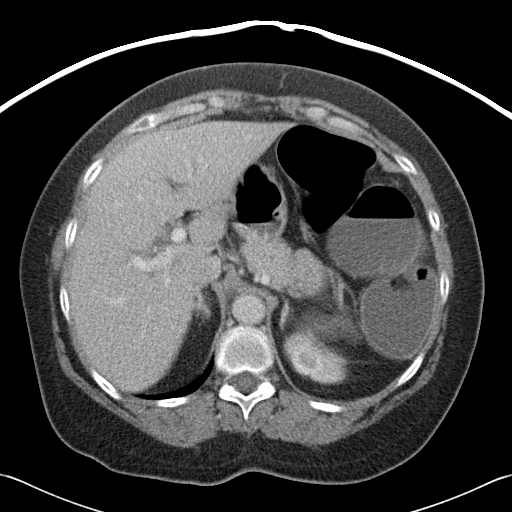
[im 83/88  soft-tissue]
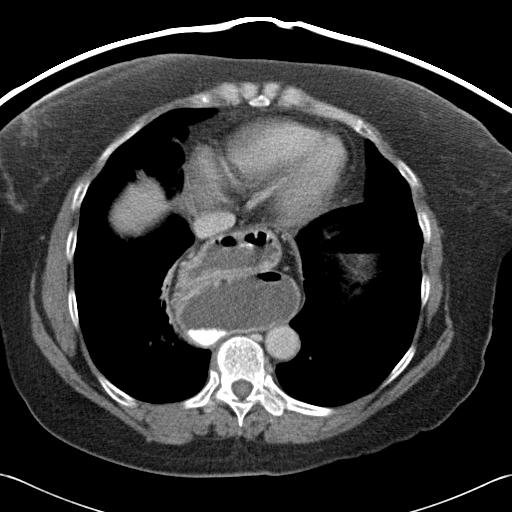

[Series 602: coronal · coronal · 0.86mm/px · 3 of 81 slices shown]
[im 27/81  soft-tissue]
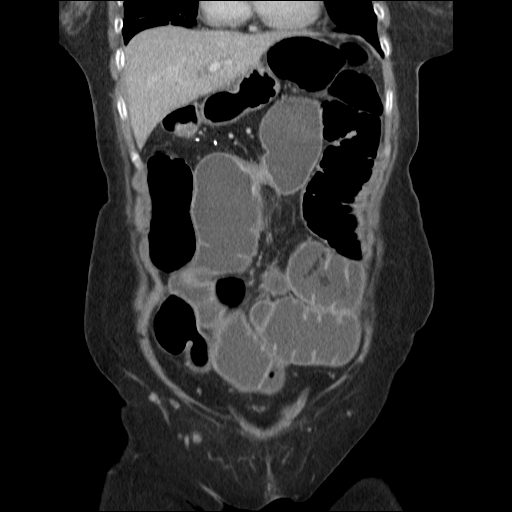
[im 36/81  soft-tissue]
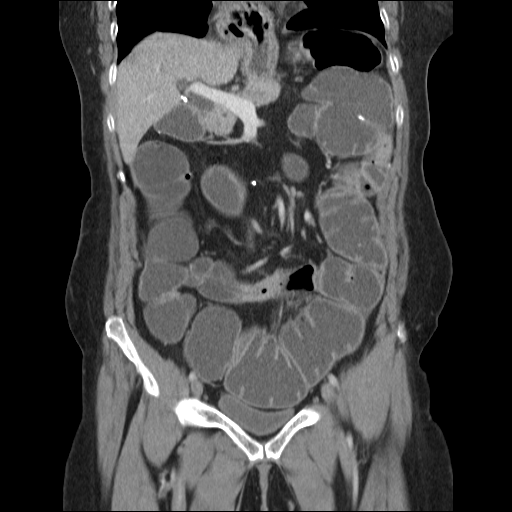
[im 45/81  soft-tissue]
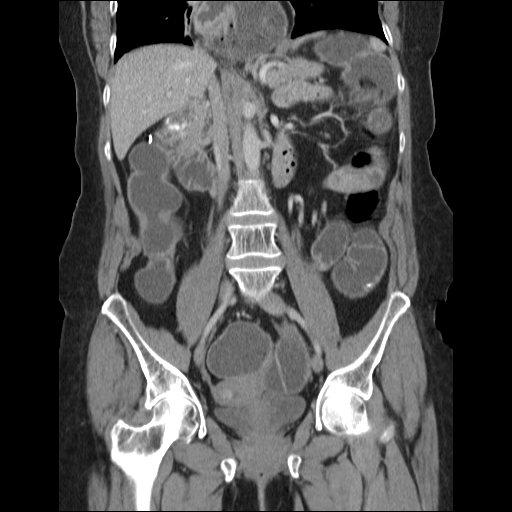

[16 of 46 positions shown; findings below may reference images not displayed]

FINDINGS: Large hiatal hernia noted.  There is linear atelectasis
or scarring in the lingula, right middle lobe, and right lower
lobe, although overall the airspace opacities at the lung bases are
significantly improved.

A hypodense lesion in the right hepatic lobe measures 0.8 x 0.7 cm
(formerly 0.7 x 0.5 cm, and back on 08/16/2008, 0.8 x 0.6 cm).
This likely represents a cyst.

The spleen appears unremarkable, as do the pancreas and adrenal
glands.  The gallbladder is surgically absent.

There is dilation of most of the jejunum, the entire remaining
ileum, and the entire colon.  The appearance suggests diffuse
ileus, although there are some areas of mucosal enhancement in the
jejunum and colon potentially reflecting active Crohn's
inflammation.

The kidneys appear unremarkable, as do the proximal ureters.

No abscess is identified.  No ascites noted.  The urinary bladder
appears unremarkable.

The sacroiliac joints appear normal.  There is degenerative loss of
articular space both hips.  There are bilateral pars defects at L5
but these are somewhat low in position and are not associated with
subluxation.

Fusiform shaped Tarlov cyst noted at the S2-3 level.
IMPRESSION: 1.  Dilated small and large bowel extending to the rectum, probably
representing diffuse ileus although there are some areas of
moderate mucosal enhancement in the bowel which may reflect active
Crohn's disease.
2.  Pars defects at L5 without subluxation.
3.  Tarlov cysts in the upper sacrum.
4.  Improved airspace opacities in the lung bases.
5.  Large hiatal hernia.

6.  Stable right hepatic lobe hypodense lesion, likely a cyst.

## 2011-04-21 NOTE — H&P (Signed)
Danielle [name] NO.:  [id number]   MEDICAL RECORD NO.:  [id number]          PATIENT TYPE:  INP   LOCATION:  5128                         FACILITY:  [facility]   PHYSICIAN:  Maisie Fus A. Cornett, M.D.DATE OF BIRTH:  29-May-1946   DATE OF ADMISSION:  05/02/2009  DATE OF DISCHARGE:                              HISTORY & PHYSICAL   Dr. Karilyn Cota in [place] as her gastroenterologist and Dr. Gabriel Cirri is her  primary surgeon in [place].   CHIEF COMPLAINT:  Abdominal pain with nausea and vomiting.   HISTORY OF PRESENT ILLNESS:  [name] is a 65 year old black female  with a limited past medical history only of Crohn disease with multiple  bowel resection secondary to Crohn strictures.  She was recently  admitted to [facility] in March 2010 for small bowel obstruction, which  resolved with conservative management.  She has had serial admissions in  the past for essentially the same problem.  Approximately 3 weeks ago,  the patient began having some abdominal pain that was pretty vague.  During this first week of pain, she states she had some nausea, but that  resolved.  Then, this nausea and vomiting came back approximately 4 days  ago.  She presented to [facility] Emergency Department twice over the  past several days with the complaint of abdominal pain, nausea, and  vomiting, and was diagnosed with urinary tract infection.  Due to the  continued pain, she presented to [facility] where she was told the same  thing.  At that time, she was given Cipro for urinary tract infection.  This did not seem to help her pain and due to continued nausea and  vomiting, the patient presented back to [facility] Emergency Department  today where a CT scan of the abdomen and pelvis was completed, which  showed a probable small bowel obstruction with active Crohn's colitis.  However, upon looking at the CT scan it is very difficult to determine  whether her large bowel or small bowel is dilated  as her anatomy is not  very clear due to the multiple bowel resections.  Because of this we  were called to admit the patient.   REVIEW OF SYSTEMS:  Please see HPI, otherwise, all other systems are  negative.   PAST MEDICAL HISTORY:  Crohn disease.   PAST SURGICAL HISTORY:  The patient is unsure of how many exact  abdominal procedures she has had; however, she has had many procedures  including multiple exploratory laparotomies with bowel resections, as  well as I believe, a colostomy and colostomy takedown.  She does also  state she has had an appendectomy and cholecystectomy and multiple  rectal I and Ds for perirectal abscesses, probably secondary to her  Crohn.  She has also had a left ankle surgery, as well as a total  thyroidectomy.   SOCIAL HISTORY:  The patient is divorced.  She does not have any  children.  She is a former smoker, but quit many many years ago.  She  denies any alcohol or illegal drugs.   ALLERGIES:  1.  MORPHINE.  2. SULFA.  3. ASPIRIN.   MEDICATIONS:  1. Vicodin 1-2 p.o. q.4 h. p.r.n. pain.  2. Klor-Con 40 mEq once a day.  3. Lasix 40 mg once a day.  4. Phenergan 25 mg as needed.  5. Loperamide, dose unknown p.r.n. for diarrhea.  6. Mercaptopurine dose unknown twice a day.  7. Pantoprazole 40 mg twice a day.  8. Bentyl as needed.  9. Remicade.  She has been receiving these treatments over the last      several weeks.  She was supposed to receive her last treatment      today; however, she felt bad enough that she did not go for her      treatment.  10.Vitamin B complex with vitamin B12 and C once a day.  11.Cipro 500 mg one p.o. b.i.d. times last 2 days for urinary tract      infection.  12.Flagyl 500 mg one p.o. t.i.d. times last 2 weeks.   PHYSICAL EXAMINATION:  GENERAL:  Danielle [name] is a 65 year old black  female who is currently sitting up in her bed in no acute distress.  She  is well developed and well nourished.  VITAL SIGNS:   Temperature 98.1, pulse 91, respirations 20, and blood  pressure 109/73.  HEENT:  Head is normocephalic and atraumatic.  Sclerae noninjected.  Also, the patient does wear glasses.  Pupils are equal, round, and  reactive to light.  Ears and nose without any obvious masses or lesions.  No rhinorrhea.  Mouth is pink and moist.  Throat shows no exudate.  NECK:  Supple.  Trachea was midline.  No thyromegaly.  HEART:  Regular rate and rhythm.  Normal S1, S2.  No murmurs, gallops,  or rubs are noted.  Carotid +2 , radial, and pedal pulses bilaterally.  LUNGS:  Clear to auscultation bilaterally with no wheezes, rhonchi, or  rales noted.  Respiratory effort is nonlabored.  ABDOMEN:  Soft with some minimal distension.  She does have active bowel  sounds and only has mild tenderness on exam.  She does have multiple  scars on her belly from all her prior surgical procedures.  No  peritoneal signs are noted.  MUSCULOSKELETAL:  All 4 extremities are symmetrical with no cyanosis,  clubbing, or edema.  SKIN:  Warm and dry without any obvious masses, lesions, or rashes.  NEUROLOGIC:  Cranial nerves II through XII appeared to be grossly  intact.  Deep tendon reflex exam is deferred at this time.  PSYCHIATRIC:  The patient is alert and oriented x3 with an appropriate  affect.   LABORATORY DATA AND DIAGNOSTICS:  White blood cell count is 5,400,  hemoglobin 11.6, hematocrit 34.0, and platelet count is 354,000.  Sodium  139, potassium 3.4, glucose 92, BUN 9, creatinine 1.13.  CT of the  abdomen and pelvis showed dilated loops of small bowel with a large  hiatal hernia; however, as stated above due to the patient's multiple  surgical procedures, her anatomy was very difficult to define and it is  unclear whether these bowel dilatations are small bowel or a large bowel  or possibly even though.   IMPRESSION:  1. Crohn's enterocolitis with possible bowel obstruction.  2. Urinary tract infection.   PLAN:   [name] will be admitted to 5100.  She will remain n.p.o. and  various IV medications will be given to her for pain and nausea.  She  will also be given Cipro to continue treatment of her urinary  tract  infection.  We will recheck lab and get serial x-rays in the morning.  We will start her on SCDs with Lovenox for DVT prophylaxis.  In the  meantime, we would like the patient to sign a release of information  forms that we can obtain her operative  reports from Dr. Brayton Caves office in [place].  So, we have a better idea of  what type anatomy we are dealing with, so we can better treat the  patient.  Otherwise, we have obtained a GI consult to help with the  management of her active Crohn disease.      Letha Cape, PA      Maisie Fus A. Cornett, M.D.  Electronically Signed    KEO/MEDQ  D:  05/02/2009  T:  05/03/2009  Job:  [id number]   cc:   Erskine Speed, M.D.  Lionel December, M.D.

## 2011-04-21 NOTE — H&P (Signed)
Danielle [name], Danielle [name] NO.:  [id number]   MEDICAL RECORD NO.:  [id number]          PATIENT TYPE:  INP   LOCATION:  5035                         FACILITY:  [facility]   PHYSICIAN:  Della Goo, M.D. DATE OF BIRTH:  [birth year]   DATE OF ADMISSION:  11/12/2008  DATE OF DISCHARGE:                              HISTORY & PHYSICAL   PRIMARY CARE PHYSICIAN:  Unassigned.   CHIEF COMPLAINT:  Abdominal pain.   HISTORY OF PRESENT ILLNESS:  This is 65 year old female with a history  of Crohn disease who presents to the emergency department secondary to  complaints of worsening abdominal pain, nausea, vomiting and diarrhea  since yesterday a.m.  She reports vomiting up food-like material at  first, then progressed to dry heaves.  She denies having any  hematemesis.  She also has had diarrhea, multiple episodes.  Denies  having any melena or hematochezia.  She also denies having any fevers,  chills, chest pain, shortness of breath, lightheadedness, dizziness,  myalgias or arthralgias.  She describes her abdominal pain as being  diffusely located and rates the pain as being an 8/10 at the worst.   PAST MEDICAL HISTORY:  Significant for Crohn disease diagnosed in the  32s.  Her gastroenterologist is Dr. Karilyn Cota in [place], [place].   PAST SURGICAL HISTORY:  History of multiple abdominal surgeries for  bowel obstructions.  She reports having 5 surgeries for bowel  obstruction.  She also reports having a colostomy placement and  colostomy takedown and also an appendectomy and cholecystectomy.   Her medications at this time include:  1. Imuran 25 mg 2 tablets p.o. b.i.d.  2. Iron 325 mg one p.o. daily.  3. Pentasa 325 mg 3 tablets p.o. t.i.d.  4. Hydrocodone/APAP 5/500 one p.o. q.6 h. p.r.n. severe pain.  5. Potassium chloride 20 mEq 1 p.o. b.i.d.  6. Multivitamin 1 p.o. daily.  7. Phenergan p.r.n. 25 mg q.6 h p.r.n. nausea or vomiting.   ALLERGIES:  ASPIRIN and  SULFA.   SOCIAL HISTORY:  The patient lives with her brother and nephew.  She is  a nonsmoker and nondrinker.   FAMILY HISTORY:  Positive for paternal aunts, uncles and cousins with  Crohn disease.  Positive for coronary artery disease in her paternal  family, also hypertension in her paternal family and also diabetes in  her mother.   REVIEW OF SYSTEMS:  Pertinents are mentioned above.  The patient denies  having any fevers, chills, congestion, seizure activity, headaches,  blurry vision, syncope.  She does report having poor appetite and p.o.  intake over the past 24 hours.   PHYSICAL EXAMINATION FINDINGS:  This is a 65 year old obese female in  discomfort but no acute distress.  VITAL SIGNS:  Temperature 97.3, blood pressure 127/88, heart rate 84,  respirations 20, O2 saturations 100% on room air.  HEENT EXAMINATION:  Normocephalic, atraumatic.  Pupils equally round,  reactive to light.  Extraocular movements are intact.  Funduscopic  benign.  Oropharynx is clear.  NECK:  Supple, full range of motion.  No thyromegaly, adenopathy,  jugular venous distention.  CARDIOVASCULAR:  Regular rate and rhythm.  No murmurs, gallops or rubs.  LUNGS:  Clear to auscultation bilaterally.  ABDOMEN:  Positive bowel sounds, soft, nontender, nondistended.  EXTREMITIES:  Without cyanosis, clubbing or edema.  NEUROLOGIC EXAMINATION:  Nonfocal.   LABORATORY STUDIES:  White blood cell count 45.3, hemoglobin 14.2,  hematocrit 43.5, platelets 482, neutrophils 95%.  Sodium 138, potassium  3.7, chloride 102, bicarb 21, BUN 15, creatinine 1.30 and glucose 174.  Urinalysis reveals moderate hemoglobin and moderate leukocyte esterase.  Abdominal x-rays reveal partial small-bowel obstruction.  No free air.  CT scan of the abdomen and pelvis revealed dilated small bowel loops.   ASSESSMENT:  A 65 year old female being admitted with:  1. Abdominal pain.  2. Partial small-bowel obstruction.  3. Crohn  disease.  4. Urinary tract infection.  5. Leukocytosis.   PLAN:  The patient will be admitted and placed on clear liquids for  bowel rest, and IV fluids have been ordered for maintenance therapy.  Pain control therapy and antiemetics have also been ordered.  The  patient will be placed on IV antibiotic therapy of ciprofloxacin to  cover the urinary tract infection.  Flagyl therapy will also be  considered.  She will continue her regular medications and her  electrolytes will be monitored.  DVT and GI prophylaxis have also been  ordered.  A repeat abdominal series will also be performed to monitor  the patient's progress while she is hospitalized and further  consultations will ensue pending results of the patient's x-ray studies  and her clinical course.      Della Goo, M.D.  Electronically Signed     HJ/MEDQ  D:  11/12/2008  T:  11/12/2008  Job:  [id number]

## 2011-04-21 NOTE — H&P (Signed)
Danielle [name], Danielle [name]               ACCOUNT NO.:  [id number]   MEDICAL RECORD NO.:  [id number]          PATIENT TYPE:  INP   LOCATION:  5159                         FACILITY[facility]  [facility]   PHYSICIAN:  Carlena Hurl, MDDATE OF BIRTH:  [birth year]   DATE OF ADMISSION:  02/11/2009  DATE OF DISCHARGE:                              HISTORY & PHYSICAL   CHIEF COMPLAINT:  Abdominal pain, nausea, vomiting, and diarrhea of 1-  day duration.   HISTORY OF PRESENT ILLNESS:  This is a 65 year old pleasant Caucasian  lady who has a past medical history significant for Crohn disease  diagnosed in 1980, coming in with 1-day episode of nausea, vomiting,  diarrhea, and abdominal pain.  The history is obtained by the patient.  According to her, this patient started having abdominal cramps and  nausea and vomiting and diarrhea started one day prior to this  admission.  Initially, she vomited the food material and later she noted  a green color fluid coming out through the vomitus.  She thought it is  stool.  She has not noted any blood in the vomitus.  She is also having  loose stools, but there is no blood or no mucus in it.  She is  complaining of right lower quadrant pain, which is sharp and 8/10 and  not radiating anywhere and there is no relieving or aggravating factors.  This patient was admitted recently and she was admitted on November 12, 2008, for pretty much similar symptoms and the patient states that she  was diagnosed with Crohn disease in 97s and she follows up with Dr.  Karilyn Cota in [place], [place].  She denies any other past medical  history.   PAST SURGICAL HISTORY:  The patient had multiple abdominal surgeries for  bowel obstruction.  She reports having five surgeries for bowel  obstruction and also had a colostomy placement done and later it was  reversed and she also had appendectomy and cholecystectomy.   FAMILY HISTORY:  Nothing significant.   MEDICATIONS:  Medications  the patient is on at home are:  1. Imuran 25 mg 2 tablets p.o. 2 times a day.  2. Iron pills 325 mg p.o. 1 time a day.  3. Pentasa 25 mg p.o. 3 times a day.  4. Hydrocodone tablets for pain.  5. Potassium chloride 20 mEq p.o. 2 times a day.  6. Multivitamin tablet.  7. Phenergan as needed for nausea and vomiting.   ALLERGIES:  The patient is allergic to ASPIRIN and PENICILLIN.   SOCIAL HISTORY:  The patient lives with her brother and nephew.  She  denies smoking, alcohol, or drug abuse.   FAMILY HISTORY:  Positive for paternal aunts, uncles, and cousins with  Crohn disease; also positive for coronary artery disease in her paternal  family; and significant history of diabetes and hypertension in the  paternal family and in her mother.   REVIEW OF SYSTEMS:  Pretty much same as history of present illness.   PHYSICAL EXAMINATION:  GENERAL:  This is a 65 year old Caucasian lady  who is lying comfortably on the bed with  NG tube without any significant  abdominal pain or severe shortness of breath.  VITAL SIGNS:  When she came into the ER, blood pressure 132/84, pulse  rate of 96, respirations 24, temperature 98.5, and saturating 100% on  room air.  HEENT:  Head is atraumatic and normocephalic and there is NG tube in her  nose without any suction.  NECK:  Supple.  No JVD appreciated.  LUNGS:  Clear to auscultation bilaterally.  CVS:  S1 and S2 heard with regular rate and rhythm.  ABDOMEN:  Soft.  Bowel sounds present, nontender, and nondistended.  EXTREMITIES:  No pedal edema noted.  Pulses are palpable bilaterally.  No cyanosis, no clubbing appreciated.   LABORATORY DATA:  WBC 6.6, hemoglobin 12.9, hematocrit 38.2, platelets  of 351, and neutrophils of 85%.  Sodium 137, potassium 4.4, chloride  105, bicarb 22, glucose of 119, BUN 11, and creatinine of 1.19.  AST,  ALT, alk phos, and albumin are within normal limits.  Calcium of 9.9 and  lipase of 95.   The patient had a CT chest  and pelvis, which showed markedly dilated  small bowel consistent with small bowel obstruction and focal area of  small bowel wall thickening in the right abdomen, which is showing a  recurrent Crohn disease.  CT of pelvis also showed high grade small  bowel obstruction with a transient area in the region of the distal  ileum reflecting a focus of recurrent Crohn disease.  No other  significant pelvic findings noted.   ASSESSMENT AND PLAN:  A 65 year old lady with a history of Crohn  disease, now coming in with nausea, vomiting, and diarrhea; and the CT  abdomen showing small bowel obstruction.  1. Small bowel obstruction.  This patient had multiple abdominal      surgeries in the past because of the Crohn disease.  This could be      from the adhesions causing the small bowel obstruction.  The      patient has NG tube now and so far there is no suctioning from the      NG tube and the patient clinically is stable and not feeling any      nauseous and even her abdominal pain has improved.  So for now, we      will treat this small bowel obstruction symptomatically and surgery      has also already been consulted and further management will be      depending upon the patient's clinical status and depending upon the      surgical consult evaluation.  2. Recurrent Crohn disease.  CT abdomen showed small bowel wall      thickening in the right abdomen and at this time the patient will      be started on IV Solu-Medrol.  We will start this patient on IV      Solu-Medrol for the Crohn disease.  3. Nausea and vomiting.  The patient will be given Zofran as needed      for her nausea and vomiting.  4. Deep venous thrombosis prophylaxis.  The patient will be placed on      Lovenox 40 mg subcu 1 time a day.      Carlena Hurl, MD  Electronically Signed     JD/MEDQ  D:  02/12/2009  T:  02/12/2009  Job:  [id number]

## 2011-04-21 NOTE — Discharge Summary (Signed)
[name], [name] NO.:  [id number]   MEDICAL RECORD NO.:  [id number]          PATIENT TYPE:  INP   LOCATION:  5159                         FACILITY:  [facility]   PHYSICIAN:  Marcellus Scott, MD     DATE OF BIRTH:  December 12, 1945   DATE OF ADMISSION:  02/11/2009  DATE OF DISCHARGE:  02/14/2009                               DISCHARGE SUMMARY   PRIMARY MEDICAL DOCTOR:  Gentry Fitz.   PRIMARY GASTROENTEROLOGIST:  Dr. Lionel December.  Phone number [phone]-  [phone].   DISCHARGE DIAGNOSES:  1. Recurrent small bowel obstruction.  Clinically resolved.  2. Crohn disease.  3. Recurrent urinary tract infection.  [name]. Chronic anemia.   DISCHARGE MEDICATIONS:  1. Bentyl liquid 1 teaspoon before each meal.  2. Prednisone increased to 40 mg p.o. daily.  3. Imuran 20 mg tablet, 2 tablets p.o. daily.  [name]. Potassium 40 mEq p.o. b.i.d.  5. Protonix 40 mg p.o. b.i.d.  6. Multivitamin 1 p.o. daily.  7. Calcium 1 p.o. daily.  8. Iron tablets 325 mg p.o. daily.  9. Pentasa 250 mg tablet, 2 tablets p.o. t.i.d.  10.Phenergan 25 mg every 6 hourly as needed.  11.Cipro 500 mg p.o. b.i.d. to complete a 1-week course.   PROCEDURES:  1. X-ray of the abdomen on February 12, 2009.  Impression:  Persistent      small bowel obstruction pattern.  NG tube looped in the distal      stomach or proximal duodenal loop.  2. Chest x-ray on February 12, 2009.  Impression:  Suboptimal inspiration      accounts for atelectasis in the lower lobes and lingula.  No acute      cardiopulmonary disease otherwise.  Hiatal hernia.  3. CT of the abdomen with contrast.  Impression:      a.     Markedly dilated small bowel consistent with a small bowel       obstruction.  A focal area of small bowel thickening in the right       abdomen may be an area of recurrent Crohn disease.  This looks       very similar to a prior study.      b.     Status post cholecystectomy with stable mild common bile       duct dilatation.  [name]. CT  pelvis.  Impression:      a.     A high-grade small bowel obstruction with a transition area       in the region of the distal ileum which may reflect a focus of       recurrent Crohn disease.      b.     No other significant pelvic findings.  5. X-ray of the abdomen and chest.  Impression:      a.     Streaky bibasilar atelectasis.      b.     Moderate-sized hiatal hernia.      c.     Small bowel obstruction bowel gas pattern.   Danielle DATA:  CBC:  Hemoglobin [name], Danielle [name], Danielle [name], Danielle  219.  Urine culture from February 12, 2009 is greater than 100,000 colonies  per mL.  This sample seems contaminated.  Urine drug screen was  negative.  Basic metabolic panel on February 13, 2009 unremarkable with BUN  13, creatinine 1.16.  Cardiac enzymes negative.  Homocysteine 8.  TSH  0.580.  Urinalysis with too-numerous-to-count Danielle blood cells and a  few bacteria.  BNP less than 30.  Lipid panel unremarkable.  Lactic acid  0.8.  Lipase 95.   CONSULTATIONS:  1. GI, Dr. Arlyce Dice of [facility].  2. [facility], Dr. Johna Sheriff.   HOSPITAL COURSE AND PATIENT DISPOSITION:  Danielle [name] is a 65 year old  female patient with history of Crohn disease, multiple abdominal  surgeries for bowel obstruction and prior colostomy, appendectomy, and  cholecystectomy who presented with nausea, vomiting, diarrhea, and  abdominal pain.  There was no blood or mucus in either one of them.  She  had recently been admitted on November 12, 2008 for similar symptoms.  She was now admitted with a diagnosis of small bowel obstruction for  further evaluation and management.  1. 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  The patient was admitted to the medical floor.      Surgery and GI were consulted given the history of multiple      abdominal surgeries to rule out a mechanical obstruction.  The      patient's bowel was rested by making her  nil per oral and NG tube      to low wall suction.  She was placed on IV Solu-Medrol and Flagyl.      With these measures the patient actually did quite well.  Her      steroids were subsequently switched to p.o. and her NG tube was      discontinued and diet was gradually advanced.  Today she has      tolerated an advanced low-residue diet with no further abdominal      pain, nausea, vomiting or diarrhea.  Her PPD testing has been      negative.  GI recommends continuing all her home medications and      follow up with her GI physician and recommended starting Remicade      as outpatient.  She has been advised to avoid narcotic pain      medications for some time.  She can use Tylenol on an as-needed      basis. The patient has an appointment to see Dr. Karilyn Cota tomorrow,      February 15, 2009 at 10:45 in the morning.  She is being advised to      follow with him with a repeat CBC.  2. Crohn disease.  Management as per problem #1.  3. Recurrent urinary tract infection.  The patient indicates she was      treated a month ago for a UTI.  However, she had some dysuria on      this admission.  She has been started to complete a week's course      of Cipro.  She is to call back in 2 days for final urine culture      results.  [name]. Anemia which is probably chronic.  Unclear what her baseline is.      However, she has had steadily dropping blood counts in the hospital      from 10.8 to 9.7-[name].  There is no  overt bleeding.  I have      recommended repeating the Hemoglobin and Danielle prior to      discharge but she indicates that she would rather follow up with      Dr. Karilyn Cota in the a.m. with a repeat CBC.  Apparently he also has      [facility].  This has been      encouraged.   This patient at this time is stable, asymptomatic of any dizziness,  weakness, lightheadedness or dyspnea.  She will be discharged home to  follow up with her GI physician in the a.m.  with a repeat CBC.   Time spent coordinating this discharge:  35 minutes.      Marcellus Scott, MD  Electronically Signed     AH/MEDQ  D:  02/14/2009  T:  02/14/2009  Job:  [id number]   cc:   Lionel December, M.D.  Lorne Skeens. [place]worth, M.D.  Barbette Hair. Arlyce Dice, MD,FACG

## 2011-04-21 NOTE — Discharge Summary (Signed)
[name], [name] NO.:  [id number]   MEDICAL RECORD NO.:  [id number]          PATIENT TYPE:  INP   LOCATION:  5031                         FACILITY:  [facility]   PHYSICIAN:  Danielle Paling, MD    DATE OF BIRTH:  Apr 22, 1946   DATE OF ADMISSION:  07/08/2009  DATE OF DISCHARGE:  07/10/2009                               DISCHARGE SUMMARY   PRIMARY CARE PHYSICIAN:  Danielle [name], M.D.   GASTROENTEROLOGIST:  Danielle [name], M.D.   PRIMARY SURGEON:  Danielle [name], M.D.   ADMITTING HISTORY:  Please refer to the excellent admission note  dictated by Danielle [name], M.D. under history of present illness.   PRIMARY DIAGNOSIS:  Small bowel obstruction.   SECONDARY DIAGNOSES:  1. History of complicated and advanced Crohn's disease.  2. History of recurrent Crohn's flare.  3. Recurring urinary tract infection.   DISCHARGE MEDICATIONS:  No new medication added.  The patient is to  continue home medication which is as follows:  1. 6-mercaptopurine 50 mg daily p.o. q.12 hours.  2. Lasix 20 mg p.o. daily.  3. KCl 40 mEq p.o. daily.  4. Prednisone 10 mg p.o. daily.  5. Flagyl 500 mg p.o. q.8 hours.  6. Protonix 40 mg p.o. q.12 hours.  7. Multivitamin 1 tablet p.o. daily.  8. Vitamin B12 injections monthly.  9. Ambien 10 mg p.o. q.h.s.  10.Vicodin 5/500 mg p.o. q.6 hours p.r.n.  11.Imodium 2 tablets p.o. q.8 hours p.r.n.  12.Phenergan 25 mg p.o. q.6 hours p.r.n.   HOSPITAL COURSE:  The following issues were addressed during the  hospitalization:  1. Small bowel obstruction.  The patient was admitted to the hospital      and GI consult and general surgery consult were obtained.  Chest x-      ray showed small bowel obstruction.  However it resolved and      followup abdominal x-ray today shows resolution of the small bowel      obstruction.  Also clinically the patient has tolerated p.o. intake      very well; had a bowel movement and had no nausea or vomiting and     abdominal pain has resolved as well.  The patient is      hemodynamically and clinically stable to go home.  2. Crohn's disease.  Continued home medications at the hospital.  The      patient will follow up with her primary gastroenterologist in 1      week time as an outpatient to adjust her prednisone dose.   PROCEDURES PERFORMED AND CONSULTATIONS PERFORMED:  As mentioned under  hospital course.   DISPOSITION:  The patient is going to follow up with Dr. Lionel [name]  in [place] in 1 week's time and with Dr. Mirna [name] in 1 week's time as  well.  Total time spent in discharge 45 minutes.      Danielle Paling, MD  Electronically Signed     NP/MEDQ  D:  07/10/2009  T:  07/10/2009  Job:  [id number]   cc:   Danielle Friendly. Loleta Chance, MD  Danielle [name],  M.D. 

## 2011-04-21 NOTE — Discharge Summary (Signed)
NAMEMIKAYA, BUNNER NO.:  [id number]   MEDICAL RECORD NO.:  [id number]          PATIENT TYPE:  INP   LOCATION:  5020                         FACILITY:  [facility]   PHYSICIAN:  Monte Fantasia, MD  DATE OF BIRTH:  11-28-1946   DATE OF ADMISSION:  05/15/2009  DATE OF DISCHARGE:  05/17/2009                               DISCHARGE SUMMARY   PRIMARY CARE PHYSICIAN:  Unassigned to [place].  The primary care physician  in [place], Dr. Cyndia Bent.   DISCHARGE DIAGNOSES:  1. Chron disease flare.  2. Diarrhea, which is secondary to the above.  3. Abdominal pain, which is resolved, secondary to Chron disease flare      and diarrhea.   DISCHARGE MEDICATIONS:  1. Lasix 20 mg p.o. daily.  2. Protonix 40 mg p.o. b.i.d.  3. Imodium 2 mg p.o. b.i.d. p.r.n. diarrhea, discontinue.  4. Flagyl 500 mg p.o. t.i.d. for 5 more days.  5. Prednisone 60 mg p.o. daily of 5 days, then 40 mg p.o. daily for 3      days, then 20 mg p.o. daily for 3 days, then 10 mg p.o. daily for 3      days, then 5 mg p.o. daily for 3 days, and then to discontinue.  6. Ambien 5 mg p.o. nightly p.r.n. insomnia.  7. Vicodin 500/5 one tablet p.o. q.6 h. p.r.n. pain.   COURSE DURING THE HOSPITAL STAY:  Ms. Signer is a 65 year old Caucasian  lady patient who was admitted with complaints of severe abdominal pain  due to severe Crohn disease flare.  As per the patient, the pain was  similar to what she gets with the Chron flare and that she had tapered  her prednisone too fast than usual, which caused that as per her.  The  patient was started on her prednisone, initially started with 60 mg.  At  present, the patient's abdominal pain has resolved well.  We would go  slow on tapering the prednisone as dictated above, and the patient is  recommended to follow up with her primary care physician and the GI  physician as an outpatient.  The patient has been also empirically given  Flagyl 3 times a day to complete the  course of 7 days.  As it has shown  in some patients to assist with correction of acute flare.  The patient  received her monthly vitamin B12 during the stay in the hospital.  At  present, the patient is medically stable to be discharged and can be  discharged home.  The patient has a history of recurrent urinary tract  infection and has been treated multiple times for the same.  At present,  she is asymptomatic for the UTI and hence no antibiotics had been  started.  Possibly, this could be secondary to colonization.   LABORATORY DATA DONE DURING THE STAY IN THE HOSPITAL:  Total WBC 10.9,  hemoglobin 10.4, hematocrit 30.9, platelets of 218.  Sodium 142,  potassium 4.3, chloride 117, bicarb 19, glucose 158, BUN 7, creatinine  0.92, calcium of 8.5.   RADIOLOGICAL INVESTIGATIONS:  Done  during the stay in the hospital:  1. Abdominal x-ray done on May 14, 2009.  Impression:  No significant change from prior small bowel ileus versus  obstruction similar to prior.  1. Abdominal x-ray done on May 15, 2009:  Dilated bowel loops.  2. Abdominal Xray - resolution of ileus   DISPOSITION:  The patient at present is medically stable to be  discharged and can be discharged home.   PHYSICAL EXAMINATION:  VITAL SIGNS:  Temperature of 98.3, heart rate of  90, respirations 22, blood pressure 126/74.  HEENT/NECK:  Neck is supple.  Pupils equal and reacting to light.  No  pallor.  No lymphadenopathy.  RESPIRATORY:  Air entry is bilaterally equal.  No rales.  No rhonchi.  CARDIOVASCULAR:  S1 and S2.  Regular rate and rhythm.  ABDOMEN:  Soft.  No guarding.  No rigidity.  No tenderness.  Per  abdomen, no distention.  Bowel sounds are good, not hyperactive.      Monte Fantasia, MD  Electronically Signed     MP/MEDQ  D:  05/17/2009  T:  05/18/2009  Job:  [id number]   cc:   Dr. Cyndia Bent

## 2011-04-21 NOTE — Consult Note (Signed)
Danielle [name] NO.:  [id number]   MEDICAL RECORD NO.:  [id number]          PATIENT TYPE:  INP   LOCATION:  5159                         FACILITY:  [facility]   PHYSICIAN:  Danielle [name], Danielle McclainDATE OF BIRTH:  1945/12/31   DATE OF CONSULTATION:  02/12/2009  DATE OF DISCHARGE:                                 CONSULTATION   REQUESTING PHYSICIAN:  Cathlyn Parsons, NP   REASON FOR CONSULTATION:  High-grade small-bowel obstruction.   HISTORY OF PRESENT ILLNESS:  Danielle [name] is a 65 year old female patient  with history of Crohn's diagnosed in 63.  She was followed by Dr.  Karilyn Cota in [place], [place].  She presented to the emergency  department in the past 24 hours with complaints of abrupt onset of  abdominal pain associated with nausea and vomiting.  The patient does  admit to eating cabbage this past Sunday.  She had prior presentation  with abdominal pain and nausea and vomiting associated small bowel  obstruction in December 2009.  CT at that time showed in the right upper  abdomen an area of the bowel wall thickening and associated small bowel  dilatation.  The small bowel obstruction in December though did resolve  with conservative treatment.  Since that time, the patient reports that  she has done well with her Crohn's management.  She is currently on 3  medications, Imuran, Pentasa and her physician just placed her on  prednisone and she was also to start Remicade infusions later this  month.   In regards to prior problems related to Crohn's, she has had at least 6-  7 laparotomy procedures involving the small bowel or colon because of  her Crohn disease.  She has also had prior colectomy with colostomy and  associated subsequent colostomy takedown as well.  In regards to the  historical evolution of her Crohn disease, she did not present initially  with any obstructive process.  Gastroenterology was also evaluating the  patient for a Crohn's  exacerbation.   REVIEW OF SYSTEMS:  As per the history of present illness.  No  constitutional symptoms such as fevers, chills, or myalgias.  GI:  Although the patient reports she has not had any problems related to her  Crohn disease since she was seen in December, she has had several  emergency department visits over the past 12 months for GI complaints  and she was seen at least twice since the December visit, once was on  November 26, 2008 and once was on February 07, 2009.  She does report that  she has been passing flatus since lunch time today.   PAST MEDICAL HISTORY:  Crohn disease.   PAST SURGICAL HISTORY:  1. Six to seven laparotomy procedures regarding Crohn's complications      for either lysis of adhesions and possible bowel resections, small      bowel, she has also had a prior colectomy and colostomy with      colostomy takedown.  2. Appendectomy.  3. Cholecystectomy.   FAMILY HISTORY:  Noncontributory to this admission.   SOCIAL HISTORY:  No alcohol or tobacco.  She works out of her home.   ALLERGIES:  ASPIRIN and PENICILLIN.   HOME MEDICATIONS:  Imuran, iron, Pentasa, hydrocodone, potassium  chloride, multiple vitamin, and Phenergan.  Since admission, she has  also been started on IV Solu-Medrol as well as Flagyl.   PHYSICAL EXAMINATION:  GENERAL:  Pleasant female patient complaining of  discomfort related to her NG tube.  VITAL SIGNS:  Temperature 98, BP 110/71, pulse 87 and regular, and  respirations 18.  PSYCH:  The patient is alert and oriented x3.  Affect is appropriate to  current situation.  NEUROLOGIC:  Cranial nerves II-XII are grossly intact.  The patient is  moving all extremities x4.  No focal neurologic deficits.  EYES:  Sclerae noninjected and nonicteric.  EARS, NOSE, and THROAT:  Ears are symmetrical.  No otorrhea.  Nose is  midline.  No rhinorrhea.  Oral mucous membranes pink and moist.  CHEST:  Bilateral lung sounds.  Clear to auscultation.   Respiratory  effort is unlabored.  She is saturating 97% on room air.  CARDIOVASCULAR:  Heart sounds S1-S2.  No rubs, murmurs, clicks, or  gallops.  No JVD.  No peripheral edema.  Pulses regular, nontachycardic.  ABDOMEN:  Slightly obese but nontender, slightly distended, and  diminished bowel sounds.  NG tube is in place and clamped.  The patient  is pending transport to the x-ray department for a KUB.  The NG tube has  thin, green bilious returns noted.  EXTREMITIES:  Symmetrical in appearance without cyanosis or clubbing.   LABORATORY DATA:  Presentation urinalysis is abnormal consistent with  UTI.  Sodium 136, potassium 4.6, CO2 22, glucose 140, BUN 12, and  creatinine 1.03.  LFTs are normal.  White count 4000, hemoglobin 10.8,  and platelets 276,000.  PTT 31, PT 13.9, and INR 1.0.   DIAGNOSTIC DATA:  CT of the abdomen and pelvis again reveals duodenal  and small bowel dilatation with air-fluid levels consistent with a bowel  obstruction.  The colon is decompressed.  She has a focal area of  thickening in the right abdomen similar to prior study on December 2009.  From my review, this area actually looks somewhat worsened with more  strictured appearance and subsequently more noticeable bowel dilatation.   IMPRESSION:  1. High-grade small-bowel obstruction in a patient with known Crohn      stricture.  2. Probable acute Crohn's exacerbation versus mechanical obstruction      from recently ingested cabbage.  3. Probable urinary tract infection.   PLAN:  1. Continue current medical therapy with bowel rest, NG tubes, and IV      fluids.  2. Agree with treatment of Crohn's at the present time with a steroid      pulsing, Flagyl as was the GI evaluation.  Hopefully, medical      therapy will resolve the patient's bowel obstruction and she will      avoid operative intervention this admission.  3. Follow up on today's KUB to see if the obstruction is passing and      she is  beginning to develop air and contrast      in the colon.  The patient self-reports flatus passage today.  4. If symptoms do not resolve, the patient may need operative      intervention this admission.  This would be difficult given the      patient's multiple prior abdominal procedures.      Danielle [name],  N.P.      Danielle [name], M.D.  Electronically Signed    ALE/MEDQ  D:  02/12/2009  T:  02/13/2009  Job:  [id number]

## 2011-04-21 NOTE — Discharge Summary (Signed)
NAMENIVEAH, BOERNER NO.:  [id number]   MEDICAL RECORD NO.:  [id number]          PATIENT TYPE:  INP   LOCATION:  5128                         FACILITY:  [facility]   PHYSICIAN:  Currie Paris, M.D.DATE OF BIRTH:  1946/01/09   DATE OF ADMISSION:  05/02/2009  DATE OF DISCHARGE:  05/05/2009                               DISCHARGE SUMMARY   ADMITTING PHYSICIAN:  Maisie Fus A. Cornett, MD   DISCHARGE PHYSICIAN:  Currie Paris, MD   GASTROENTEROLOGIST:  Lionel December, MD, Jonita Albee.   PRIMARY SURGEON:  Erskine Speed, MD, Jonita Albee.   PROCEDURES:  None.   CONSULTANTS:  Iva Boop, MD, Clementeen Graham, [facility].   REASON FOR ADMISSION:  Ms. Christoffel is a 65 year old black female with a  past medical history of Crohn disease and multiple abdominal surgeries  secondary to her Crohn disease.  She presented to the emergency  department with a complaint of about a week's worth of pain with some  nausea.  She then progressed to having multiple bouts of emesis as well.  Once here, she was found to have a possible bowel obstruction on CT scan  with evidence of Crohn colitis at her terminal ileum.  We were asked to  see the patient at this time secondary to bowel obstruction.  Please see  admitting history and physical for further details.   ADMITTING DIAGNOSES:  1. Crohn enterocolitis with possible bowel obstruction.  2. Urinary tract infection.   HOSPITAL COURSE:  The patient was admitted and continued on her Cipro  for her urinary tract infection.  She was made n.p.o.  At this time,  Gastroenterology was consulted and after their evaluation, they felt  that the patient probably did not have a bowel obstruction but simply  had a Crohn's exacerbation.  At this time, the patient was placed on  Imuran for her exacerbation.  She was continued on this and her pain  continued to improve.  The patient was passing flatus and having bowel  movements by hospital day 1 and therefore her  diet was advanced as  tolerated.  By her final hospital day, the patient did not have any  complaints, was tolerating a regular diet, continuing to have bowel  movements and was not having much pain.   On exam, her abdomen was soft, nontender, and nondistended with active  bowel sounds.  At this time, it was felt that the patient was stable for  discharge to home with quick follow up with her primary  gastroenterologist in [place], Dr. Karilyn Cota.   DISCHARGE DIAGNOSES:  1. Crohn enterocolitis with possible bowel obstruction.  2. Bowel obstruction, resolved.  3. Urinary tract infection.   DISCHARGE MEDICATIONS:  She was informed to continue her;  1. Lasix 40 mg daily.  2. Phenergan 25 mg p.r.n. nausea.  3. Pantoprazole 40 mg 2 times a day.  4. Loperamide 2 mg 2 times a day.  5. Mercaptopurine 50 mg 2 times a day.  6. Bentyl 2 mg as needed.  7. Fluconazole 150 mg daily.  8. Vitamin B12 monthly.   She was given prescriptions  for,  1. Prednisone 40 mg once a day.  2. Imuran 150 mg once a day.  3. Vicodin 7.5/500 one q.6 h. p.r.n. pain.   She was informed to stop taking her,  1. Cipro 500 mg 2 times daily.  2. Flagyl 500 mg 2 times daily.  3. Oxycodone 5/325 as needed.  4. Doxycycline 100 mg 2 times a day.   DISCHARGE INSTRUCTIONS:  Ms. Carneal is informed that she may resume a  regular diet.  She has no activity restrictions and may shower.  She has  no wound care that is applicable, and she is to follow up with Dr.  Karilyn Cota in [place] for which she already has an appointment on May 08, 2009,  which is this upcoming week.      Letha Cape, PA      Currie Paris, M.D.  Electronically Signed    KEO/MEDQ  D:  05/05/2009  T:  05/05/2009  Job:  [id number]   cc:   Erskine Speed, M.D.  Lionel December, M.D.  Iva Boop, MD,FACG

## 2011-04-21 NOTE — H&P (Signed)
Danielle [name], Danielle [name] NO.:  [id number]   MEDICAL RECORD NO.:  [id number]          PATIENT TYPE:  EMS   LOCATION:  MAJO                         FACILITY:  [facility]   PHYSICIAN:  Vania Rea, M.D. DATE OF BIRTH:  [birth year]   DATE OF ADMISSION:  07/08/2009  DATE OF DISCHARGE:                              HISTORY & PHYSICAL   PRIMARY CARE PHYSICIAN:  Dr. Mirna Mires.   GASTROENTEROLOGIST:  Lionel December, M.D. in [place].   CHIEF COMPLAINT:  Nausea, vomiting and diarrhea for 1 day.   HISTORY OF PRESENT ILLNESS:  This is 65 year old African American lady  with a known history of complicated Crohn's, status post multiple  surgeries, who was in her baseline state of health, maintained on  multiple medications until the day of admission when she started having  severe nausea, vomiting, and diarrhea associated with bloating and  abdominal distention.  The patient came to the emergency room to be  evaluated and as a part of her management had a CT scan of the abdomen  done.   The patient has had no fever, cough or cold.  No chest pain.  She has  not noted increased urinary frequency but has been having increased  thirst and dehydration.   After having had a CAT scan done, the patient passed a lot of gas and  noted that with the passage of large amounts of gas, she felt much  better.  She feels a lot of her symptoms were due to trapped gas.  However, because her CT scan showed evidence of small-bowel obstruction  with areas of marked narrowing of her gut, the hospitalist service was  called to assist with management.  Currently, patient is keeping down  ginger-ale.   PAST MEDICAL HISTORY:  1. Complicated and advanced Crohn's disease, status post multiple      abdominal surgeries, including laparotomy and osteotomies with      subsequent takedown.  2. Recurrent Crohn's flare.  Currently being managed with and      prednisone.  3. History of recurrent UTIs.  4.  She is status post appendectomy.  5. Status post cholecystectomy.   MEDICATIONS:  1. 6-mercaptopurine 50 mg twice daily.  2. Lasix 20 mg daily.  3. Potassium 40 mEq daily.  4. Prednisone on a taper 10 mg daily.  Will continue this for another      10 days.  5. Flagyl 500 mg 3 times daily.  6. Protonix 40 mg twice daily.  7. Multivitamin 1 daily.  8. Vitamin B12 monthly.  9. Ambien 10 mg at bedtime when necessary.  10.Vicodin 5/500 every 6 hours when needed.  11.Imodium 2 tablets 3 times daily when needed.  She has run out.  12.Phenergan 25 mg when needed.   ALLERGIES:  1. ASPIRIN causes abdominal cramps.  2. MORPHINE causes hallucinations.  3. SULFA causes severe nausea and vomiting.   SOCIAL HISTORY:  She lives with a brother.  Denies tobacco, alcohol or  illicit drug use currently.   FAMILY HISTORY:  She has multiple uncles and aunts with Crohn disease.  However,  her siblings are all completely healthy.  Her mother had  diabetes, and her father has coronary artery disease.   REVIEW OF SYSTEMS:  Other than noted above, a 10-point review of systems  is unremarkable.   PHYSICAL EXAMINATION:  A pleasant middle-aged African American lady  lying in the stretcher in no acute distress.  VITALS:  Her temperature is 98.1.  Her pulse 75, respirations 16, blood  pressure 114, 08/02.  She is saturating at 98% on room air.  Pupils are round equal and reactive.  Mucous membranes pink.  She is  dehydrated.  No cervical lymphadenopathy or thyromegaly.  No jugular venous  distention.  No carotid bruit.  She has no oral Candida.  Her chest is clear to auscultation bilaterally.  CARDIOVASCULAR:  Regular rhythm without murmur.  Her abdomen is obese and soft.  She complains of generalized soreness.  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  Her  bowel sounds are normal.  EXTREMITIES:  Without edema.   She has 2+  dorsalis pedis pulses bilaterally.  CENTRAL NERVOUS SYSTEM: Cranial nerves II-XII grossly intact.  She has  no focal neurologic deficit.  Her skin is warm and dry.  Apart from the area just where her navel used  to be, which appears to be moist and like a fistula.  She has no obvious  ulceration.   LABS:  Her white count is 10.2, hemoglobin 13.7, platelets 301.  She has  90% neutrophils.  Absolute granulocyte count is 9.5, otherwise  unremarkable.  Her i-STAT chemistry shows a sodium of 138, potassium  3.7, chloride 115, BUN 15, creatinine 1.3, glucose 114.  Her lactic acid  is 1.3.  Urinalysis was contaminated by many epithelial cells.  However,  she had white cells too numerous to count, red cells 7 to 10, many  bacteria, rare yeast, specific gravity is 1.036, a large amount of  leukocyte esterase, total protein 100.   A 2-view chest x-ray shows no acute chest findings; however, she had a  large hiatal hernia and gas-filled loops of bowel in the left upper  quadrant of the abdomen.   CT scan of the abdomen and pelvis with p.o. and IV contrast revealed  findings consistent with small-bowel obstruction.  Two potential areas  of narrowing are identified in the right upper quadrant and left upper  quadrant.  No abscess or perforation.  A large hiatal hernia.  There are  no acute findings in the pelvis.   ASSESSMENT:  1. Acute gastroenteritis which does not seem typical of her Crohn's      flare.  2. Severe Crohn disease.  3. Dehydration  4. Urinary tract infection.  5. 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   PLAN:  Will admit this lady for hydration and treatment of her urinary  tract infection.  Will check her stool for C diff but will continue her  with Flagyl, 6-mercaptopurine, and her normal dose of prednisone.  We  will not change a dose of her prednisone nor add any other Crohn's  medications until we consult with the GI Service.  Will continue her  with clear  liquids and IV fluids.  Repeat a plain x-ray of her abdomen  in the morning.  If the x-ray suggest the obstruction has resolved, then  we will resume her Imodium.  Other plans as per orders.      Vania Rea,  M.D.  Electronically Signed     LC/MEDQ  D:  07/09/2009  T:  07/09/2009  Job:  [id number]   cc:   Annia Friendly. Loleta Chance, MD  Lionel December, M.D.

## 2011-04-21 NOTE — H&P (Signed)
[name], Danielle [name] NO.:  [id number]   MEDICAL RECORD NO.:  [id number]          PATIENT TYPE:  INP   LOCATION:  1827                         FACILITY:  [facility]   PHYSICIAN:  Lonia Blood, M.D.DATE OF BIRTH:  Nov 25, 1946   DATE OF ADMISSION:  05/15/2009  DATE OF DISCHARGE:                              HISTORY & PHYSICAL   PRIMARY CARE PHYSICIAN:  [place], [place].   CHIEF COMPLAINT:  Abdominal pain.   HISTORY OF PRESENT ILLNESS:  [name] is a very pleasant 65-  year-old female who suffers with severe advanced Crohn's disease.  She  has undergone multiple abdominal surgeries for this.  She has suffered  with multiple flares.  She was discharged from the hospital at the end  of May of 2010 with a similar symptom complex.  The patient states that  she was taking all of her usual medications and getting along fine until  approximately 6:00 a.m. this morning.  She then developed severe onset  of acute abdominal pain.  This was very shortly thereafter followed by  intractable nausea, vomiting and diarrhea.  This is typical of her usual  Crohn's flares.  She had no fever.  She denied dysuria.  She denies  chest pain, shortness of breath, fevers or chills.  She presented to the  emergency room after she was not able to control her symptoms with  Phenergan and her usual Crohn's disease medications at home.  In the  emergency room a KUB has been accomplished which raises a question of a  partial small-bowel obstruction.  It should be noted this is a typical  presentation for this patient and has been established in the past this  likely represents a flare of Crohn's disease rather than a true small-  bowel obstruction.   REVIEW OF SYSTEMS:  Comprehensive review of systems is unremarkable with  exception of multiple positive elements as noted in the history of  present illness above.   PAST MEDICAL HISTORY:  1. Severe Crohn's disease.  a.     Multiple abdominal surgeries including laparotomies and       possible ostomy with subsequent takedown.      b.     Frequent flares requiring pulse dose Imuran as well as       prednisone.  2. 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  3. Status post appendectomy.  4. Status post cholecystectomy.  5. Remote history of tobacco abuse.   OUTPATIENT MEDICATIONS:  1. Imuran 25 mg possibly b.i.d.  2. Lasix 40 mg daily.  3. Phenergan 25 mg p.r.n.  4. Protonix 40 mg b.i.d.  5. Loperamide 2 mg b.i.d.  6,  The patient reports that she takes mercaptopurine 50 mg b.i.d.  1. Bentyl 2 mg p.r.n.  2. Recent course of fluconazole possibly completed.  3. B12 injections every month.  4. Prednisone 40 mg daily - recently tapered off but the patient      resumed 40 on her own.   ALLERGIES:  ASPIRIN, SULFA AND MORPHINE.   FAMILY  HISTORY:  Noncontributory this admission but fully reviewed per  old records.   SOCIAL HISTORY:  The patient denies alcohol or use illicit drugs.  She  lives in the [place] area.  She does not presently smoke.   DATA REVIEWED:  White count is elevated at 11.4, MCV is 103 which is  above normal.  Hemoglobin and platelet count are normal.  Sodium,  potassium, chloride, bicarb, BUN, creatinine are normal.  Serum glucose  is normal.  Calcium is 9.1.  LFTs are normal.  Albumin is borderline low  at 3.3, total bili is 0.6, lipase is normal.  Urinalysis reveals large  leukocyte esterase, 15 of ketones and 21-50 white blood cells.  KUB  reveals dilated bowel with air-fluid levels raising the question of  possible small bowel obstruction.  Please see discussion above.   PHYSICAL EXAMINATION:  Temperature 98.3, blood pressure 118/78, heart  rate 87, respiratory rate 16, O2 saturation 97% on room air.  GENERAL:  Well-developed, well-nourished overweight female in no acute  respiratory distress.  HEENT:  Normocephalic,  atraumatic.  Pupils equal, round, react to light  and accommodation.  Extraocular muscles intact bilaterally.  OC/OP  clear.  NECK:  No JVD.  LUNGS:  Clear to auscultation bilaterally without wheezes or rhonchi.  CARDIOVASCULAR:  Regular rate and rhythm without murmur, gallop or rub  and normal S1, S2.  ABDOMEN:  Overweight, soft.  Bowel sounds very hypoactive but present.  Generally tender to palpation throughout all fields.  No focal mass.  No  rebound, nondistended.  EXTREMITIES:  No significant cyanosis, clubbing, edema bilateral lower  extremities.  NEUROLOGIC:  The patient is alert and oriented x4.  Cranial II-XII are  intact bilaterally.  She displays 5/5 strength bilateral upper and lower  extremities.  There is no Babinski.  She has intact sensation to touch  throughout.   IMPRESSION AND PLAN:  1. Acute Crohn's flare.  Ms. Milliner appears to have very severe      Crohn's disease.  She indeed is suffering with what she reports is      her typical acute flare.  This is concomitant with a recent      tapering of her prednisone to off.  We will increase the patient's      Imuran to 150 mg a day which is still somewhat below maximum      potential weight based dose of approximately 200 mg a day for this      patient.  It is not clear to me if it is wise to use 6-      mercaptopurine in the setting of Imuran as well as these seem to be      somewhat redundant to my knowledge.  I will hold the 6-      mercaptopurine for now until we can clarify the patient's medical      regimen from her Gastroenterology physician in the morning.  I will      place the patient back on high-dose prednisone at 60 mg p.o. b.i.d.      for now.  We will continue loperamide and Bentyl.  We will add      Flagyl at 500 mg t.i.d. as this has been shown in some patients to      assist with correction of an acute flare.  The patient will be      hydrated due to her inability to tolerate a significant p.o.  intake  to assure that she does not become dehydrated.  2. Recurrent urinary tract infections.  At this point one must decide      whether this patient has chronic colonization or true urinary tract      infections.  She has no dysuria whatsoever.  There are no fevers or      chills.  For now I am inclined to withhold antibiotic therapy.      Urine will be sent for culture.  If a significant colony count is      identified on culture we will initiate a specific antibiotic      directed at that particular pathogen.  3. Chronic macrocytosis and anemia.  At the present time the patient      is not anemic.  This may simply be secondary to hemo concentration      with dehydration.  The patient states that she continues her B12      every month.  We will follow her hemoglobin as we hydrate her and      investigate further as indicated.      Lonia Blood, M.D.  Electronically Signed     JTM/MEDQ  D:  05/15/2009  T:  05/15/2009  Job:  [id number]

## 2011-04-21 NOTE — Discharge Summary (Signed)
NAMEAHTZIRY, SAATHOFF NO.:  [id number]   MEDICAL RECORD NO.:  [id number]          PATIENT TYPE:  INP   LOCATION:  5035                         FACILITY:  [facility]   PHYSICIAN:  Eduard Clos, MDDATE OF BIRTH:  1946/01/12   DATE OF ADMISSION:  11/12/2008  DATE OF DISCHARGE:                               DISCHARGE SUMMARY   COURSE IN THE HOSPITAL:  A 65 year old female with known history of  Crohn's disease presented to the ER with complaints of nausea, vomiting,  and abdominal pain.  On admission, patient had a CAT scan of the abdomen  and pelvis which showed small bowel wall thickening involving the small  bowel.  Patient initially was placed n.p.o., and then advanced clear-  liquid diet, and advanced as tolerated.  A gastroenterology consult was  obtained with Dr. Leone Payor.  Patient also had a bout of  chronic on Cipro  and Flagyl.  After that,  patient tolerated diet and had no nausea or  vomiting.  Patient can be followed as an outpatient with  Dr. Karilyn Cota,  patient's primary gastroenterologist.   Patient will be discharged on Cipro for 3-4 days for her possible UTI.   PROCEDURES DURING THIS STAY:  CT of the abdomen and pelvis with contrast  showed small bowel thickening  , unknown etiology.  small bowel loops  with partial small bowel obstruction.  No acute intrapelvic pathology.  X-ray of the abdomen on November 13, 2008 shows nonspecific dilatation of  the left colon.   FINAL DIAGNOSES:  1. Partial small bowel obstruction secondary to Crohn's disease of the      bowel.  2. Dehydration.  3. Urinary tract infection.   DISCHARGE MEDICATIONS:  1. Imuran 50 mg p.o. b.i.d.  2. Pentasa 325 mg 3 tablets 3 times daily.  3. Iron 325 mg p.o. daily.  4. Multivitamin 1 tablet p.o. daily.  5. Potassium chloride 15 mEq twice daily.  6. Hydrocodone/ APAP 5/500 one p.o. q.6h. p.r.n. as needed.  7. Ciprofloxacin   Patient is to follow up with her primary  gastroenterology, Dr. Karilyn Cota,  December 21.  At that time,  to check her metabolic panel with Dr.  Karilyn Cota.  Patient is advised to avoid high residuediet.      Eduard Clos, MD  Electronically Signed    ANK/MEDQ  D:  11/13/2008  T:  11/13/2008  Job:  [id number]   cc:   Lionel December, M.D.

## 2011-04-21 NOTE — Discharge Summary (Signed)
Danielle [name], Danielle [name] NO.:  [id number]   MEDICAL RECORD NO.:  [id number]          PATIENT TYPE:  INP   LOCATION:  5035                         FACILITY:  [facility]   PHYSICIAN:  Isidor Holts, M.D.  DATE OF BIRTH:  December 07, 1946   DATE OF ADMISSION:  11/12/2008  DATE OF DISCHARGE:  11/15/2008                               DISCHARGE SUMMARY   ADDENDUM:   DISCHARGE DIAGNOSES:  Refer to interim discharge summary dictated  November 13, 2008, by Dr. Midge Minium.  Refer also to above  mentioned interim summary, for details of admission history, procedures,  consultations and clinical course.  For the period, however, from  November 14, 2008, to November 15, 2008, the following are pertinent:  The patient continued on Ciprofloxacin for urinary tract infection,  however, urine culture came back on November 14, 2008, demonstrating E.  coli which was quinolone resistant, but sensitive to cephalosporins.  The patient on that same day, spiked a pyrexia of 101.  She was  therefore started on intravenous Rocephin, and by November 15, 2008, was  afebrile.  Ciprofloxacin has of course been discontinued.  There were no  new issues.  The patient continued to maintain adequate oral intake  without nausea or vomiting, had no symptoms of abdominal pain, felt  considerably improved on November 15, 2008, and was very keen to be  discharged.   DISPOSITION:  The patient was considered clinically stable for discharge  on November 15, 2008.  She has been transitioned to oral steroids in pre-  admission dosage.  Imuran has of course, been continued in pre-admission  dosage.  She is anticipated to complete a further 5 days of Keflex, to  complete a 7 day course of treatment for her quinolone resistant E. coli  urinary tract infection.   DISCHARGE MEDICATIONS:  1. Imuran 50 mg p.o. b.i.d.  2. Iron 325 mg p.o. b.i.d.  3. Pentasa (325 mg) three pills p.o. t.i.d.  4. Potassium chloride 20  mEq p.o. b.i.d.  5. Multivitamin one p.o. daily.  6. Prednisone 30 mg p.o. daily.  7. Protonix 40 mg p.o. daily.  8. Keflex 500 mg p.o. b.i.d. for 5 days, from November 16, 2008.   Note:  Hydrocodone/APAP and Phenergan have been discontinued, until re-  evaluated by Dr. Karilyn Cota.   DIET:  Low residue.   ACTIVITY:  As tolerated.   FOLLOWUP INSTRUCTIONS:  The patient is to follow up with her primary  gastroenterologist, Dr. Karilyn Cota.  She has already an appointment  scheduled for November 26, 2008.  She has been urged to keep this  appointment.   SPECIAL INSTRUCTIONS:  Dr. Karilyn Cota is recommended to check the patient's  serum electrolytes, during her office visit.      Isidor Holts, M.D.  Electronically Signed     CO/MEDQ  D:  11/15/2008  T:  11/15/2008  Job:  [id number]   cc:   Lionel December, M.D.

## 2011-04-24 NOTE — Consult Note (Signed)
Danielle [name], Danielle [name]               ACCOUNT NO.:  [id number]   MEDICAL RECORD NO.:  [id number]          PATIENT TYPE:  INP   LOCATION:  A302                          FACILITY:  [facility]   PHYSICIAN:  R. Roetta Sessions, M.D. DATE OF BIRTH:  [birth year]   DATE OF CONSULTATION:  02/14/2006  DATE OF DISCHARGE:                                   CONSULTATION   REASON FOR CONSULTATION:  Nausea, vomiting, diarrhea in setting of Crohn's  disease.   HISTORY OF PRESENT ILLNESS:  Danielle [name] is a 65 year old African-American  female with longstanding history of complicated Crohn's disease.  She had  been having trouble with worsening diarrhea and abdominal pain over the past  several weeks, subsequently became significantly worse in the past couple of  days, and she presented to the emergency department for further evaluation  yesterday.  She was subsequently admitted to Dr. Rob Hickman service with  radiographic picture consistent with a partial small-bowel obstruction.  This lady has been on steroid-sparing therapy in the way of azathioprine and  was more recently changed to MT6 (mercaptomerin) [apparently for insurance  reasons] some 2 weeks ago under the direction of Dr. Karilyn Cota.  She tells me  that one month ago she had a CT scan of the abdomen at [facility]  which demonstrated active disease, and plans were being made to get her  Remicade therapy.  She has not yet had her PPD.  She tells me she got sick  when she consumed her oral prep for the CT scan, vomited, and ended up in  [facility] for 1 week with left-sided pneumonia.   On admission yesterday, she had a white count of 9.1, hemoglobin 10.8,  hematocrit 33.4, MCV 83.6.  Sodium 138, potassium 3.4, chloride 110, glucose  100, BUN 6, creatinine 0.6.  Overnight with hydration, hemoglobin 8.2,  hematocrit 25.7.  She has not had any melena or rectal bleeding.   Her abdominal pain, nausea and vomiting have resolved.  She is on a  full  liquid diet and is hungry and wants to have regular food.  She was given 60  mg Solu-Medrol as a single dose yesterday.  She has been continued on  Pentasa, MT6 (mercaptomerin).   She was also being placed on IV Cipro and Flagyl.   PAST MEDICAL HISTORY:  Significant for:  1.  Longstanding and complicated Crohn's disease.  She has had multiple      small-bowel resections, mostly done by Dr. Gabriel Cirri at [facility].      She has had at least one surgery here by Dr. Katrinka Blazing in the past, last was      a few years ago.  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  1.  She did receive Remicade back in 2002.  She had a number of treatments      and says things were helped significant with this treatment modality.  2.  She tells me she last had a colonoscopy one year ago by Dr. Gabriel Cirri.      She checked out okay.  3.  Recent hospitalization for lower lobe pneumonia at [facility].   MEDICATIONS ON ADMISSION:  1.  Potassium 40 mEq twice daily.  2.  Pentasa 3 tablets, dose unknown 3 times a day.  3.  Mercaptomerin 100 mg daily.  4.  Iron tablet twice daily.  5.  Vitamin supplement daily.  6 . Vitamin B12 IM monthly.  1.  Librax as needed.   ALLERGIES:  ASPIRIN, MORPHINE, SULFA.   FAMILY HISTORY:  Multiple family members with Crohn's disease.  There is no  history of colorectal neoplasia or chronic gastrointestinal illness.   SOCIAL HISTORY:  The patient is an Public house manager, works in [place].  She is followed  primarily by Dr. Fara Chute. No alcohol, tobacco.  She is disabled  currently because of the Crohn's.   REVIEW OF SYSTEMS:  No chest pain or dyspnea on exertion.  No skin or eye  problems.  No fever or chills.   PHYSICAL EXAMINATION:  GENERAL:  Well-developed 65 year old African-American  female sitting up in her hospital bed looking quite comfortable.  VITAL SIGNS:  Temperature 98.3,  pulse 94, respirations 16, blood pressure  97/58.   PHYSICAL  EXAMINATION:  HEENT:  No scleral icterus, oral cavity lesions.  CHEST: Lungs clear to auscultation.  CARDIOVASCULAR: Regular rate and rhythm without murmur, gallop, or rub.  ABDOMEN:  She has multiple surgical scars, nondistended.  Positive bowel  sounds. Soft and entirely nontender.  No appreciable mass or organomegaly.  EXTREMITIES:  No edema.   ADDITIONAL LABORATORY DATA:  Sodium 138, potassium 3.4, chloride 110, CO2  19, glucose 100, BUN 6, creatinine 0.9, total bilirubin 0.6, alkaline  phosphatase  82, AST 24, ALT 21, total protein 7.7, albumin 3.3, calcium  9.5.  Lipase 22 yesterday and 18 today.  Urinalysis: Specific gravity  greater than 1.030.  She had 21 to 50 white cells, many bacteria and also  many squamous epithelial cells.   IMPRESSION:  Danielle [name] is a pleasant 65 year old lady with  longstanding complicated Crohn's disease.  It sounds like she has had  worsening of her symptoms over the past several weeks and had a acute  exacerbation leading to hospitalization. She is now significantly improved  with IV fluids, symptomatic treatment, and a single dose of Solu-Medrol  overnight.  She says she is back to her more recent baseline.   I have reviewed the plain films. Indeed, she does have convincing  radiographic picture for at least partial small-bowel obstruction, but  clinically this has gotten better.   There are no old records for review at this time, but Danielle [name] is well  versed and has fairly good insight as to her disease process and evaluation  thus far, and it does sound like she was on the way towards getting  Remicade.  I certainly would like to review the CT scan and hopefully there  may be a copy in our office on a CD ROM.   RECOMMENDATIONS:  1.  For the time being, we will allow her to have a low-residue diet.  2.  We will review the CT one way or the other from [facility] done 1      month ago. 3.  We will go ahead and do a PPD skin  test anyway because it has been a      year since she has had one. This will be done in preparation for      possible  Remicade therapy.  4.  I agree with  continuing her 6-MP and Pentasa.  Hopefully will stop her      Cipro in the near future.  5.  Will review the office record.  6.  Send stool sample for C difficle toxin assay.   I would like to thank Dr. Sherle Poe for allowing me to see this nice lady  today.  Further recommendations to follow.      Jonathon Bellows, M.D.  Electronically Signed     RMR/MEDQ  D:  02/14/2006  T:  02/14/2006  Job:  [id number]   cc:   Margaretmary Dys, M.D.   Lionel December, M.D.  [address]  [place]  [place] [address]   Fara Chute, M.D.  [facility], [employer]

## 2011-04-24 NOTE — H&P (Signed)
[name], [name]               ACCOUNT NO.:  [id number]   MEDICAL RECORD NO.:  [id number]          PATIENT TYPE:  INP   LOCATION:  A302                          FACILITY:  [facility]   PHYSICIAN:  Margaretmary Dys, M.D.DATE OF BIRTH:  05-Sep-1946   DATE OF ADMISSION:  02/13/2006  DATE OF DISCHARGE:  LH                                HISTORY & PHYSICAL   ADMISSION DIAGNOSIS:  1.  Acute abdominal pain.  2.  Exacerbation of Crohn's disease.  3.  Diarrhea.  4.  Hypokalemia.   PRIMARY CARE PHYSICIAN:  Dr. Karilyn Cota, gastroenterologist.   CHIEF COMPLAINT:  Abdominal pain, diarrhea of several days' duration.   HISTORY OF PRESENT ILLNESS:  [name] is a 65 year old African-American  female who presented to the emergency room with complaints of abdominal pain  and diarrhea.  She has an 18 year history of Crohn's colitis.   She follows with Dr. Karilyn Cota.  The patient usually has about 4-5 bowel  movements a day but typically when she has an exacerbation she has greater  than 10 and that is what happened in the last 3-4 days.  She has been having  multiple episodes.  Initially, she thought this would blow away.  However,  it got even more progressive to the point where she developed some nausea  and vomiting.  She tried to use some Phenergan suppositories but without  much help.  She also has some cramping abdominal pain extra with the  diarrhea.  She had some fever and chills but had no headache, no chest pain  or shortness of breath.  She had no blood in her stools and has never had  any blood in her stools.   About a month ago she was in the hospital for a CT scan of her abdomen and  pelvis and she had some trouble with the contrast; she was vomiting, was  admitted to the hospital and eventually contracted what appeared to be  nosocomial pneumonia.   Evaluation in the emergency room revealed that she was mildly dehydrated and  mildly hypokalemia and abdominal x-rays showed possible  partial small bowel  obstruction.  The patient has been admitted now for further management.   REVIEW OF SYSTEMS:  Ten point review of systems is otherwise negative except  as mentioned in the history of present illness.   PAST MEDICAL HISTORY:  1.  Crohn's disease with multiple surgeries in the past.  The patient states      that she has had six surgeries for bowel resection.  2.  Recent nosocomial pneumonia about a month ago.  She has yet to feel that      she is back to her baseline.   MEDICATIONS:  1.  Potassium 40 mEq p.o. b.i.d.  2.  Bentyl p.r.n.  3.  Pentasa three tablets p.o. t.i.d.  4.  Mercaptopurine 50 mg two tablets p.o. once a day.  5.  Iron tablets one p.o. b.i.d.  6.  Vitamins one tablet p.o. once a day.  7.  Vitamin B12 intramuscularly once a month.  8.  Librax p.o. as needed.  ALLERGIES:  She reports allergy to aspirin and morphine sulfate.   FAMILY HISTORY:  Positive for extensive history of Crohn's disease in an  aunt and uncle and brother.   SOCIAL HISTORY:  The patient is a lifelong nonsmoker.  Does not drink  alcohol.  Denies any illicit drug use.  Currently retired.  Has been unable  to continue working because of her illness.  She is a trained LPN.   Does note a family history of colon cancer.   PHYSICAL EXAMINATION:  Conscious, alert, mild pain distress.  VITAL SIGNS:  Blood pressure on arrival in the emergency room was 100/66, pulse 108,  respiratory rate 18, T max 98.1.  Oxygen saturation was 98% on room air.  HEENT:  Normocephalic, atraumatic.  Oral mucosa is moist with no exudates.  NECK:  Supple. No JVD or lymphadenopathy.  LUNGS:  Clear clinically with good air entry bilaterally.  HEART:  S1, S1 regular.  ABDOMEN:  Soft, nondistended. No rigidity.  The patient has some mild  umbilical tenderness on palpation.  Otherwise appeared benign.  EXTREMITIES: No edema.  CNS:  Exam is grossly intact with no focal deficits.   LABORATORY DATA:  Acute  abdominal series revealed findings compatible with  small bowel obstruction.  She did have a left lower lung densities  suspicious for pneumonia which appears to be what it was previously when she  had pneumonia but patient is asymptomatic now from the pulmonary standpoint.   White blood cell count 9.1, hemoglobin 10.8, hematocrit 33.4, platelet count  589.  Neutrophils 83%.  Sodium 138, potassium 3.4, chloride 110, CO2 19,  glucose 100, BUN 6, creatinine 0.9.  Total bilirubin 0.6, AST 24, ALT 21,  albumin 3.3.  Calcium 9.5.  Lipase 22.  Urinalysis was negative.  Urine  microscopy showed many bacteria but leukocytes were trace and no nitrites.   ASSESSMENT/PLAN:  [name] is a 65 year old African-American female with  an 18 year history of Crohn's disease who presented with acute exacerbation.   The plan is to admit her at this time.  Will put her on some Solu-Medrol 60  mg IV once a day pending GI evaluation.  The patient was empirically started  on Cipro and Flagyl which I agreed to.  I have discussed with Dr Reino Kent he  will be seeing her in consult.  Will replace her potassium and hydrate her.  Will control pain with Dilaudid p.r.n.  The patient feels a lot better since  arriving in the emergency room.  She received some Demerol and Phenergan and  Levsin.   DVT prophylaxis will be with Lovenox and GI prophylaxis with Protonix.  I  will resume the patient's home medications consisting of mercaptopurine and  mesalamine.      Margaretmary Dys, M.D.  Electronically Signed     AM/MEDQ  D:  02/14/2006  T:  02/14/2006  Job:  [id number]

## 2011-04-24 NOTE — Group Therapy Note (Signed)
Danielle [name], Danielle [name]               ACCOUNT NO.:  [id number]   MEDICAL RECORD NO.:  [id number]          PATIENT TYPE:  INP   LOCATION:  A302                          FACILITY:  [facility]   PHYSICIAN:  Margaretmary Dys, M.D.DATE OF BIRTH:  February 25, 1946   DATE OF PROCEDURE:  02/14/2006  DATE OF DISCHARGE:                                   PROGRESS NOTE   SUBJECTIVE:  The patient feels a lot better.  She is actually hungry this  morning and requesting food to eat.  She has very minimal abdominal pain and  her diarrheal episodes have markedly reduced.  She has no nausea or  vomiting.  No fever, chills or rigors.  No headaches, dizziness or  lightheadedness.   OBJECTIVE:  GENERAL:  The patient is comfortable, pleasant and in better  spirits this morning.  VITAL SIGNS:  Blood pressure 97/58, pulse 94, respiratory rate 16,  temperature 98.3, oxygen saturation 100% on room air.  HEENT EXAM:  Normocephalic, atraumatic.  Oral mucosa moist.  No exudate.  Neck supple, no JVD.  LUNGS:  Clear to A&P.  HEART:  S1/S2 regular, no S3/S4 gallops or rubs.  ABDOMEN:  Soft, nontender, bowel sounds positive.  Much improved when  compared to yesterday.  EXTREMITIES:  No edema.  CNS:  Showed no focal deficits.   LABORATORY DATA:  White blood cell 12.7, hemoglobin 8.2, hematocrit 25.7,  platelet count 264,000, no left shift.  Sodium 140, potassium 3.3, chloride  114, CO2 20, glucose 90, BUN 4, creatinine 0.9, calcium 8.5, lipase 18.   ASSESSMENT:  1.  Acute Crohn's exacerbation.  The patient is much better, has less pain.      We will continue on Solu-Medrol 60 mg IV today and also Cipro and Flagyl      IV.  We will wait for input from Dr. Jena Gauss, GI.  We will also continue      on Pentasa and Mercaptopurine.  We will advance her diet.  2.  Small bowel obstruction.  Possibly very partial.  The patient is hungry      and has no nausea.  We will start with clear liquids and advance as      tolerated.   DISPOSITION:  The patient doing fairly well overall with improvement in her  symptoms.  We will continue to monitor and await GI input.      Margaretmary Dys, M.D.  Electronically Signed     AM/MEDQ  D:  02/14/2006  T:  02/14/2006  Job:  [id number]   cc:   R. Roetta Sessions, M.D.  [address]  [place]  [place] [address]

## 2011-04-24 NOTE — Discharge Summary (Signed)
Danielle, [name]               ACCOUNT NO.:  [id number]   MEDICAL RECORD NO.:  [id number]          PATIENT TYPE:  INP   LOCATION:  A302                          FACILITY:  [facility]   PHYSICIAN:  Hanley Hays. Dechurch, M.D.DATE OF BIRTH:  04-04-1946   DATE OF ADMISSION:  02/13/2006  DATE OF DISCHARGE:  03/15/2007LH                                 DISCHARGE SUMMARY   DIAGNOSES:  1.  Exacerbation of Crohn's disease.  2.  Partial small bowel obstruction, resolved.  3.  Anemia, acute on chronic, status post two units packed red blood cells.  4.  Hypokalemia, resolving.  5.  Resolving pneumonia.   DISPOSITION:  The patient is being discharged to home to follow up with her  family physician and Dr. Dionicia Abler.   DISCHARGE MEDICATIONS:  Lortab 5/500 q. 4-6h. p.r.n. pain, prednisone 20 mg  daily, further taper by Dr. Dionicia Abler, Pentasa SR 250 mg t.i.d., mercaptopurine  100 mg daily, KCl 40 mEq two times daily, and iron 1 tablet b.i.d. She will  continue her multivitamins daily and vitamin B12 monthly as per her regular  physician.   HOSPITAL COURSE:  The patient is a 65 year old African-American female with  long standing complicated Crohn's disease who presented to the hospital with  worsening diarrhea and abdominal pain.  She presented with evidence of a  partial small bowel obstruction.  With hydration and bowel rest, this  spontaneously resolved and she began taking p.o.'s without difficulty.  Her  initial hemoglobin was 10.8 and dropped to a low of 7.6 post IV hydration  and she received 2 units of packed red cells.  Hemoglobin was 10.3 at time  of discharge.  She was initially was hypokalemic with a potassium 3.4 and,  again, this was supplemented and was 3.9 at the time of discharge.  Iron  studies revealed a total iron of 36, an iron binding capacity of 248, and  saturation of 15% with normal B12 and folate levels.  She was continued on  iron supplement. Her chest x-ray revealed some changes  in the left lower  lung consistent with her resolving pneumonia.  She had no symptoms relative  to that during this stay.  She remained clinically stable and was discharged  with plan as noted above.      Hanley Hays Josefine Class, M.D.  Electronically Signed     FED/MEDQ  D:  04/16/2006  T:  04/17/2006  Job:  [id number]

## 2011-05-05 ENCOUNTER — Encounter (HOSPITAL_COMMUNITY): Payer: Medicare Other | Attending: Oncology

## 2011-05-05 DIAGNOSIS — K5090 Crohn's disease, unspecified, without complications: Secondary | ICD-10-CM | POA: Insufficient documentation

## 2011-06-01 ENCOUNTER — Ambulatory Visit (INDEPENDENT_AMBULATORY_CARE_PROVIDER_SITE_OTHER): Payer: Medicare Other | Admitting: Internal Medicine

## 2011-06-01 DIAGNOSIS — K5080 Crohn's disease of both small and large intestine without complications: Secondary | ICD-10-CM

## 2011-06-01 DIAGNOSIS — M2550 Pain in unspecified joint: Secondary | ICD-10-CM

## 2011-06-09 ENCOUNTER — Encounter (HOSPITAL_COMMUNITY): Payer: Medicare Other | Attending: Oncology

## 2011-06-09 DIAGNOSIS — K5090 Crohn's disease, unspecified, without complications: Secondary | ICD-10-CM | POA: Insufficient documentation

## 2011-06-11 IMAGING — CR DG ABDOMEN 2V
2 series · 2 of 2 positions shown · non-contrast
Comparison: CT of the abdomen and pelvis performed 03/28/2010, and
abdominal and chest radiographs performed 03/27/2010

CLINICAL DATA: Lower abdominal pain and diarrhea.

ABDOMEN - 2 VIEW

[w abdomen upright]
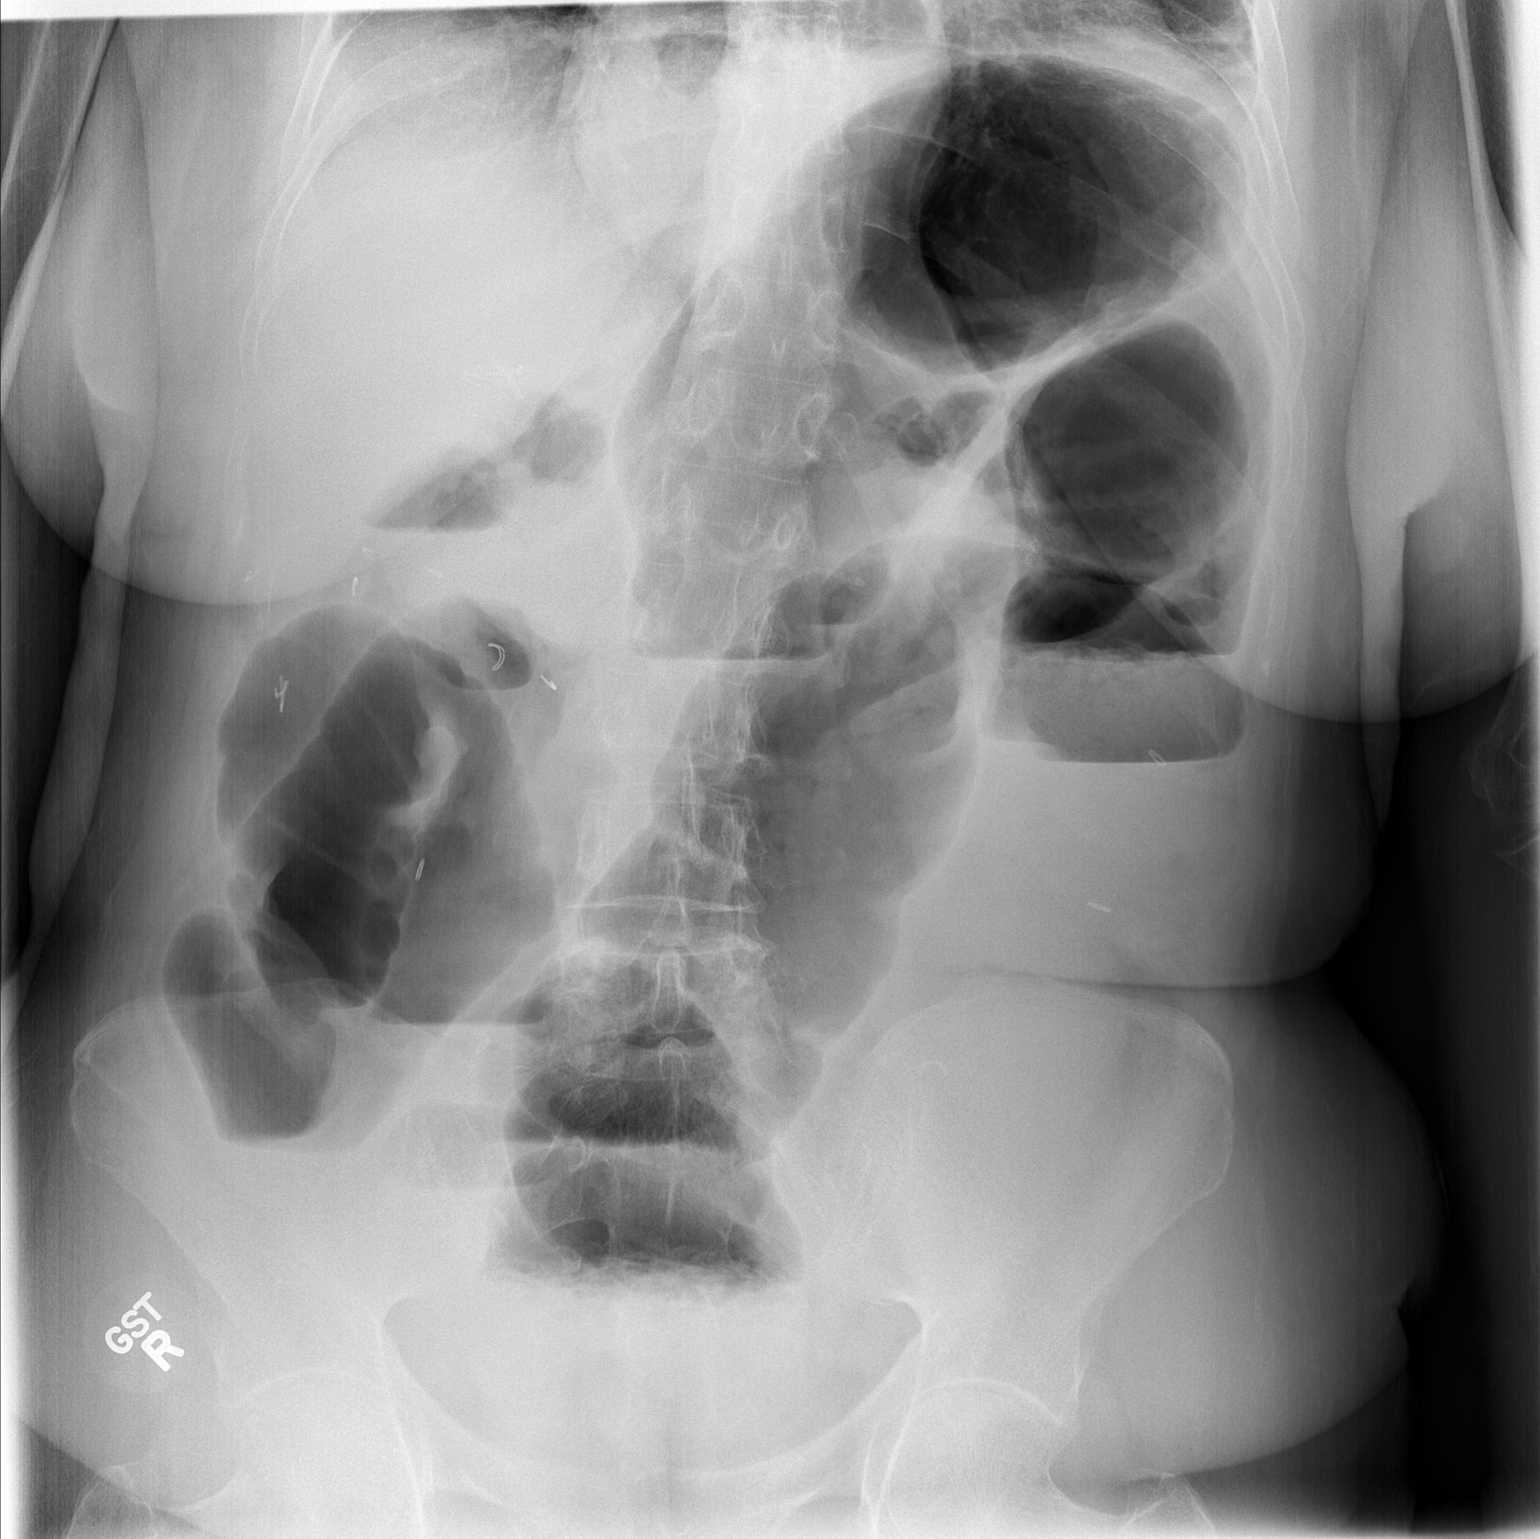

[t abdomen supine]
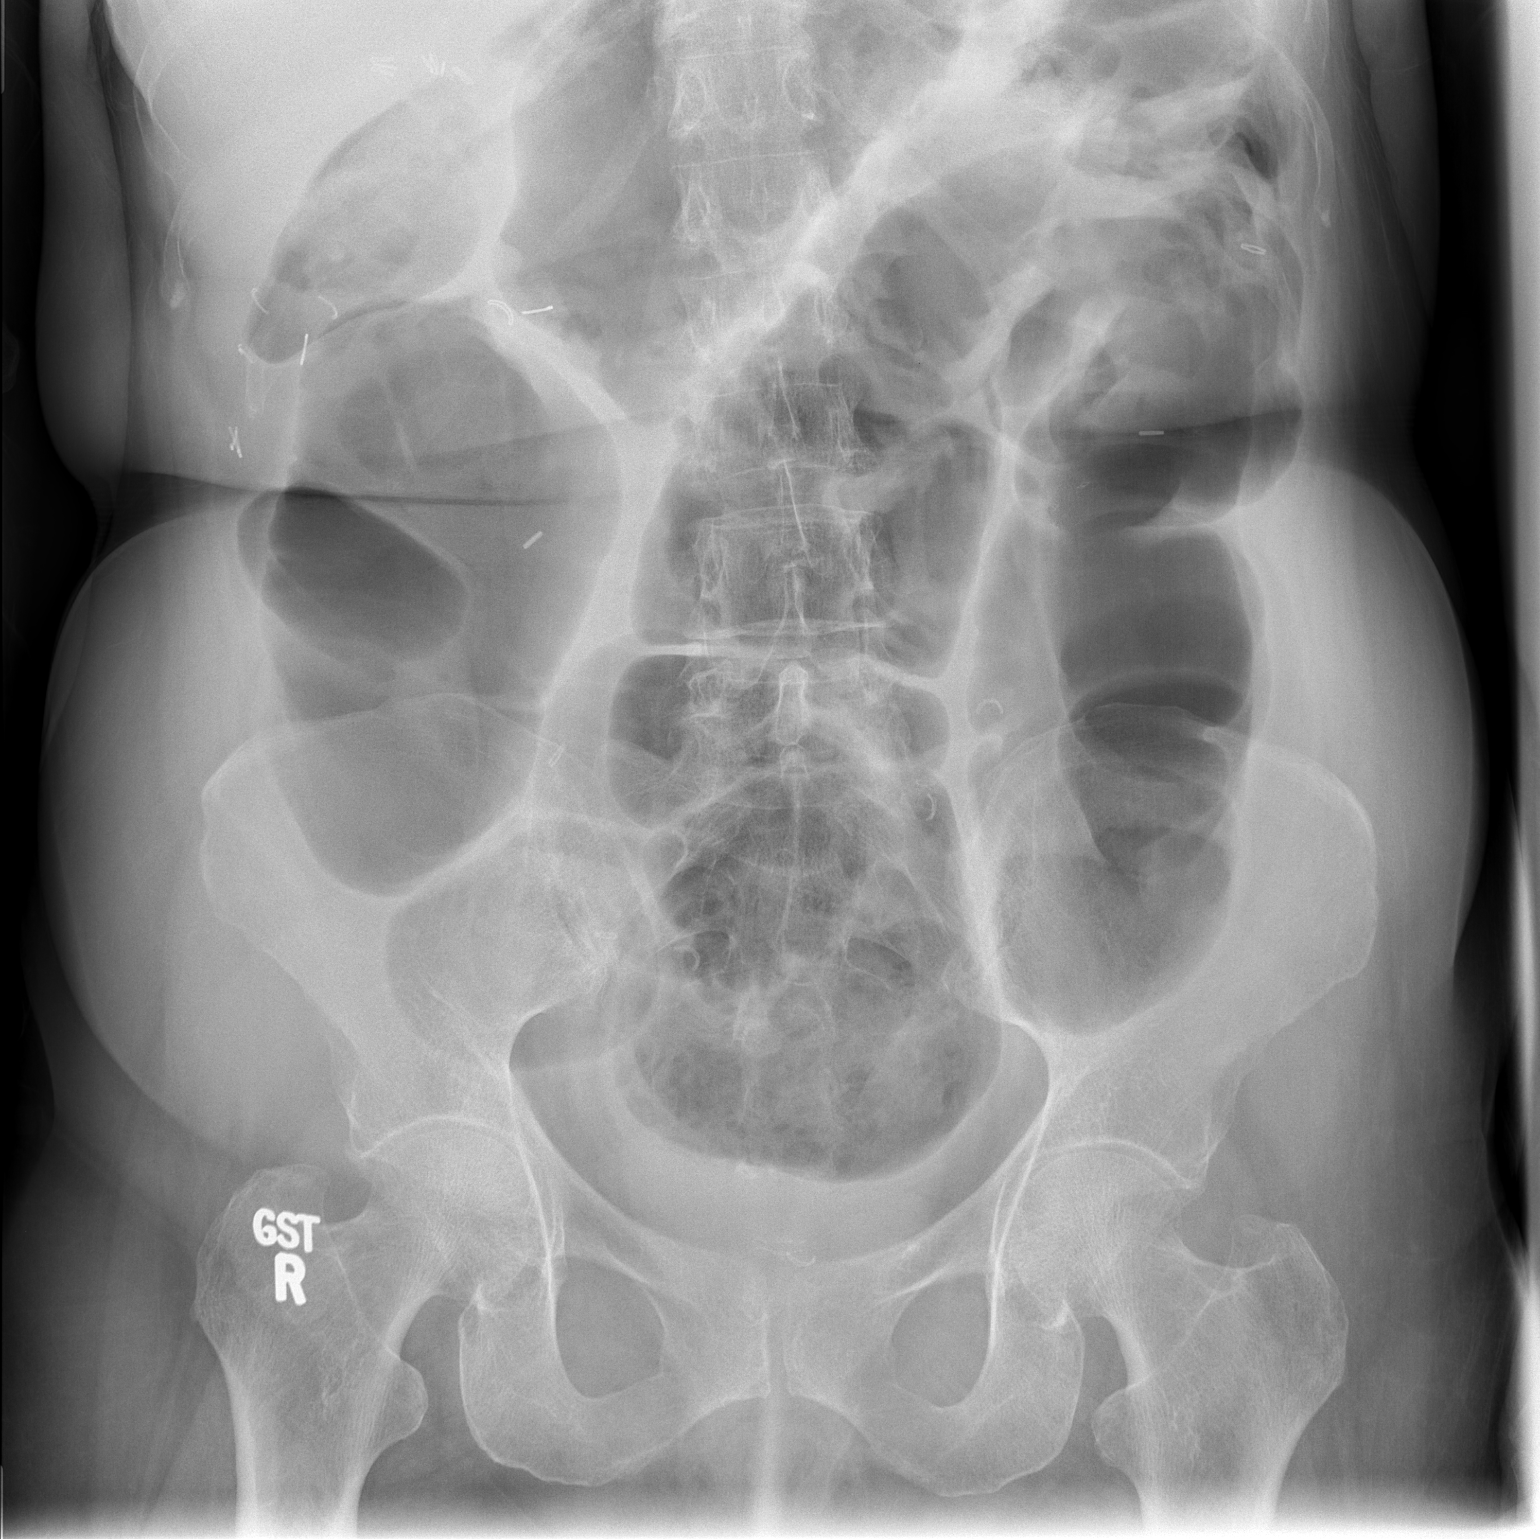

[2 of 2 positions shown; findings below may reference images not displayed]

FINDINGS: On correlation with the prior CT, the patient is status
post resection of most of the small bowel, as well as the proximal
large bowel. There is diffuse gaseous distension of the remaining
visualized bowel, with multiple prominent air-fluid levels,
measuring up to 9.4 cm in diameter.  However, this extends to the
level of the rectum.  This raises concern for ileus; no definite
mucosal thickening is seen to suggest toxic megacolon.  A very
distal obstruction might also have this appearance.

No free intra-abdominal air is identified.  No acute osseous
abnormalities are seen.  Bibasilar atelectasis is noted at the
visualized lung bases.
IMPRESSION: 1.  Diffuse gaseous distension of the remaining bowel, with
multiple air-fluid levels; this extends to the level of the rectum.
Findings raise concern for ileus; a very distal obstruction, at the
level of the rectum, might also have this appearance.  Status post
resection of most of the small bowel, as well as the proximal large
bowel.
2.  Bibasilar atelectasis noted.

## 2011-06-11 IMAGING — CR DG CHEST 2V
2 series · 2 of 2 positions shown · non-contrast
Comparison: 01/26/2010

CLINICAL DATA: Chest pain

CHEST - 2 VIEW

[w chest pa]
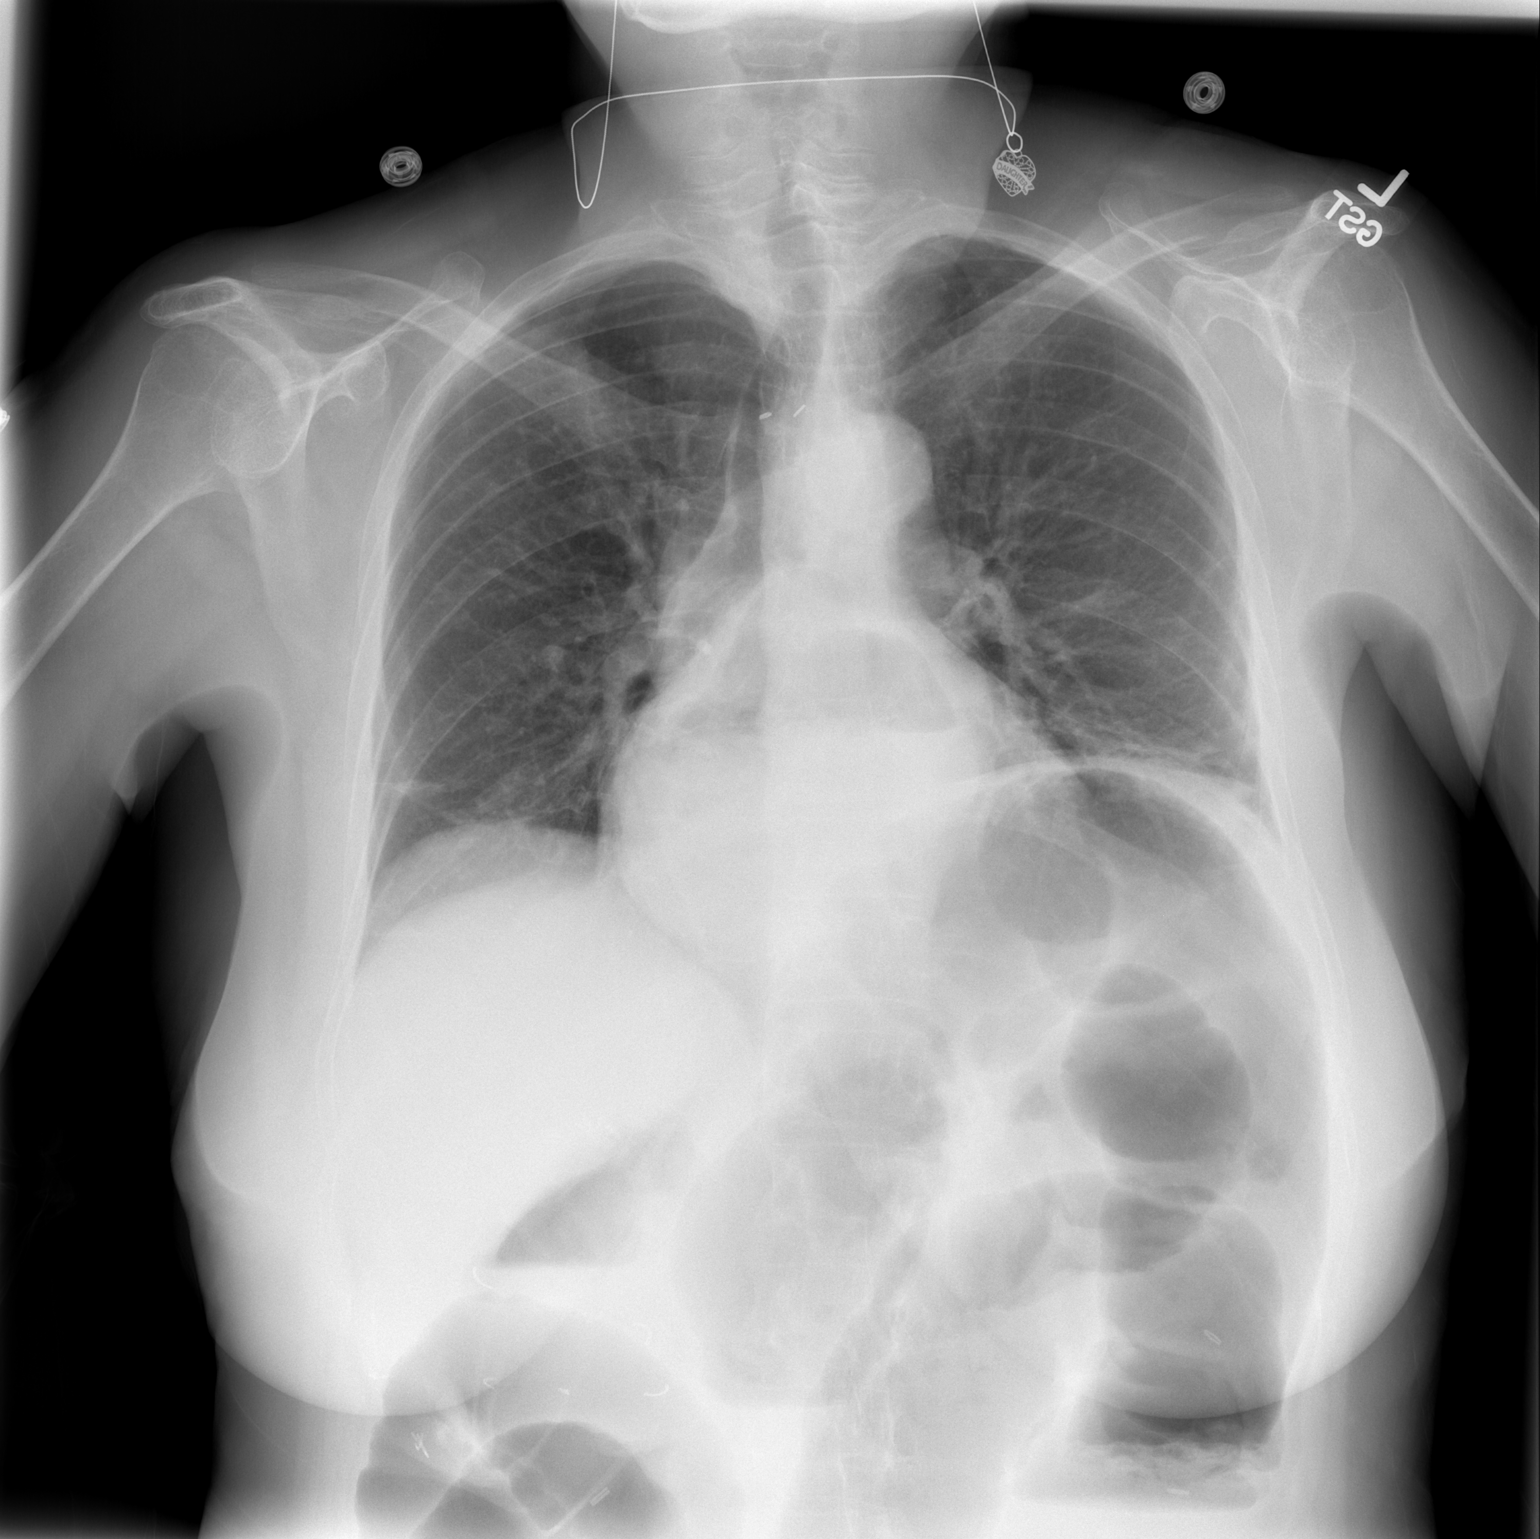

[w chest lat]
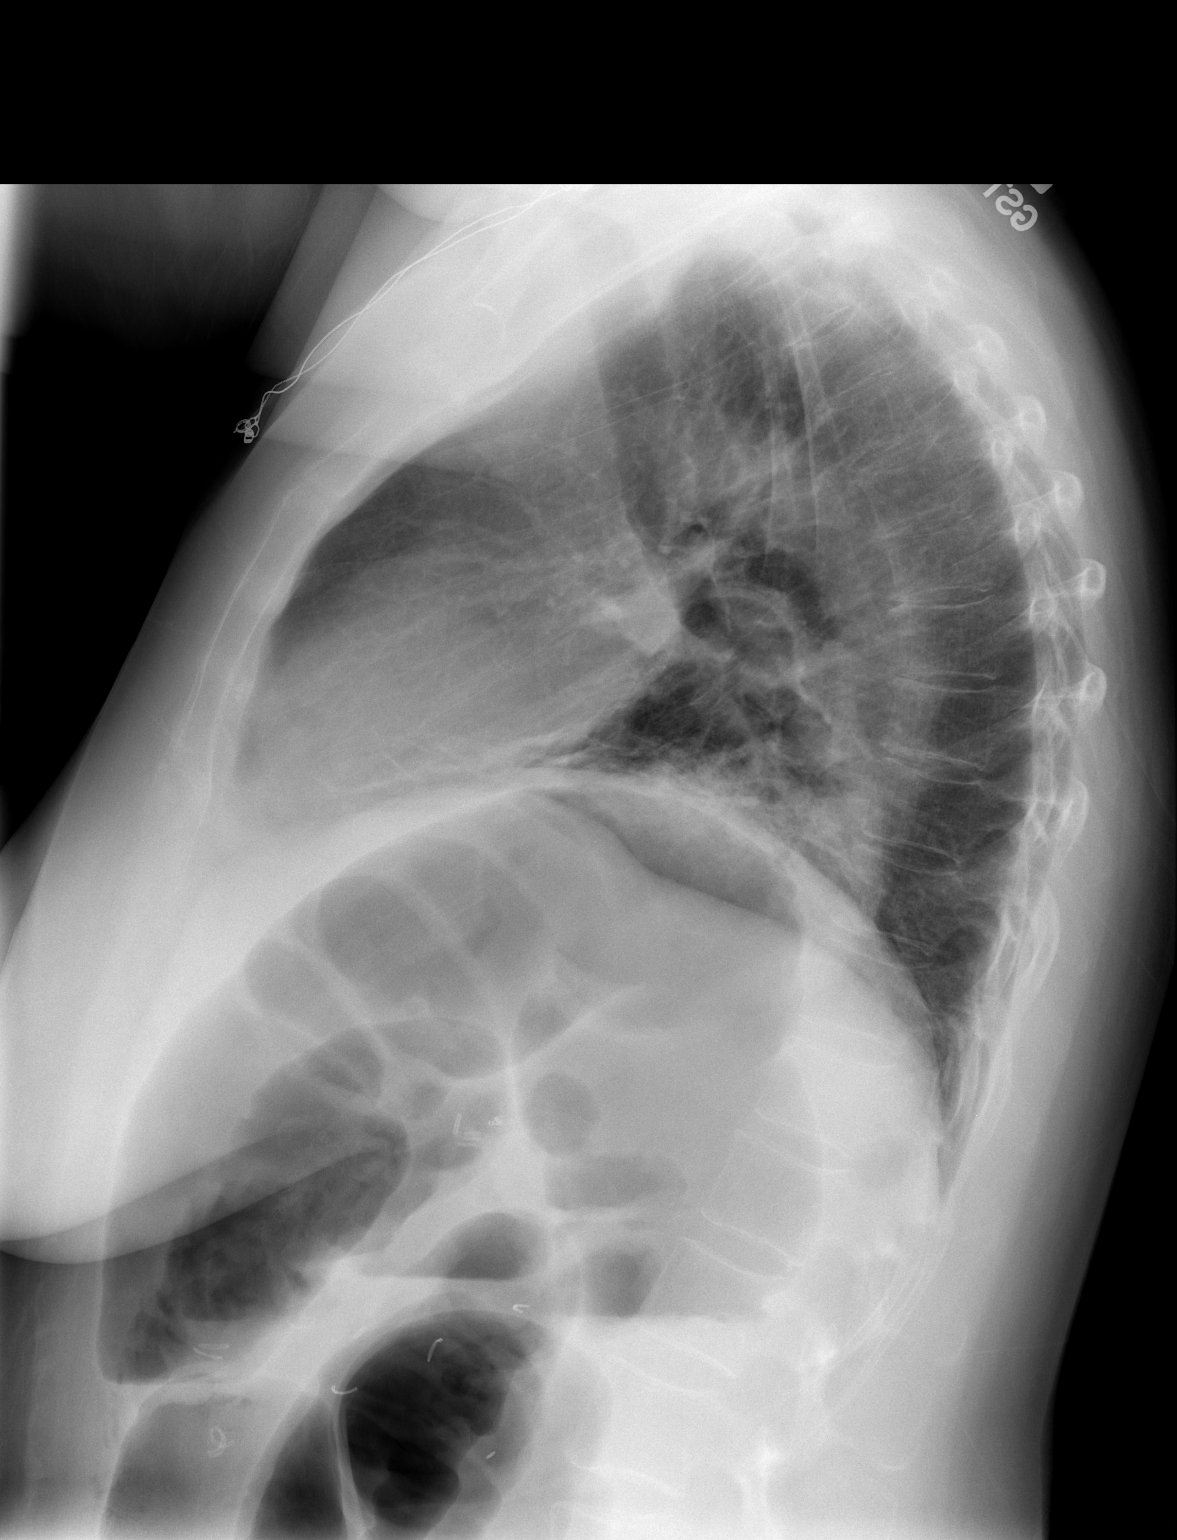

[2 of 2 positions shown; findings below may reference images not displayed]

FINDINGS: Lung volumes are low.  No edema or focal consolidation.
Cardiopericardial silhouette is at upper limits of normal for size.
Moderate-to-large hiatal hernia noted.  There is some bibasilar
subsegmental atelectasis.

Diffuse gaseous colonic distention is visualized in the upper
abdomen.  This has been present on previous studies, but is more
pronounced today.
IMPRESSION: Low volume film with bibasilar atelectasis.

Moderate-to-large hiatal hernia.

The diffuse gaseous bowel distention appears to mainly represent
colon.  The patient has had gaseous colonic distention on previous
films, but it is more prominent today.

## 2011-06-12 IMAGING — CT CT ABD-PELV W/ CM
2 of 5 series · 16 of 46 positions shown, 18 images · IV contrast (omnipaque)
Comparison: CT of the abdomen and pelvis performed 03/28/2010

CLINICAL DATA: Diffuse abdominal pain and distension; nausea and
vomiting.  Constipation.  History of Crohn's disease with extensive
bowel resection.

CT ABDOMEN AND PELVIS WITH CONTRAST
TECHNIQUE: Multidetector CT imaging of the abdomen and pelvis was
performed following the standard protocol during bolus
administration of intravenous contrast.
Contrast: 80 mL of Omnipaque 300 IV contrast

[Series 2: routine abdomen · axial · 0.90mm/px · z∈[-412,-22]mm · 13 of 88 slices shown, 15 images]
[im 5/88  soft-tissue]
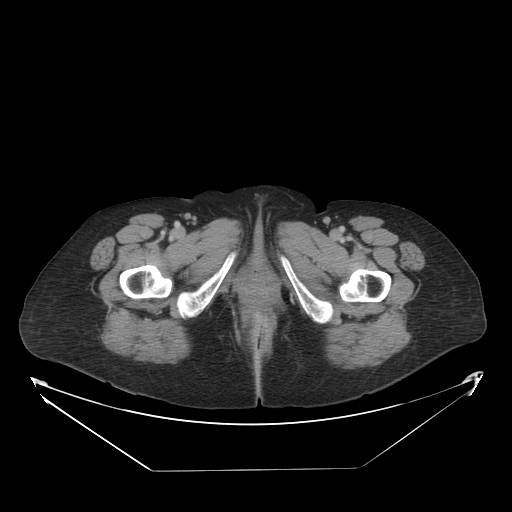
[im 5/88  bone]
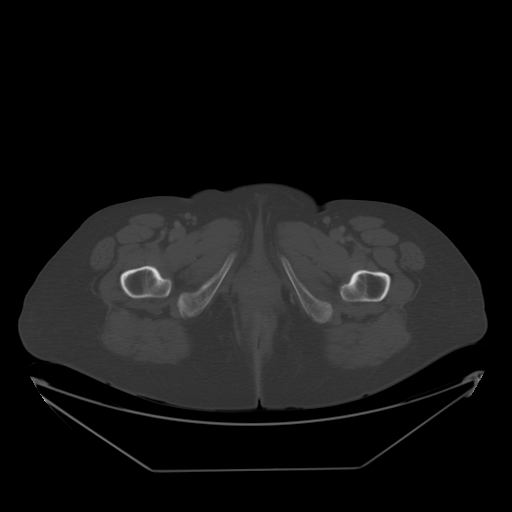
[im 10/88  soft-tissue]
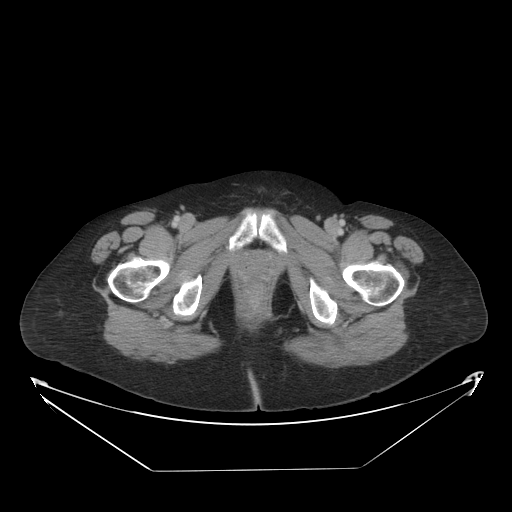
[im 20/88  soft-tissue]
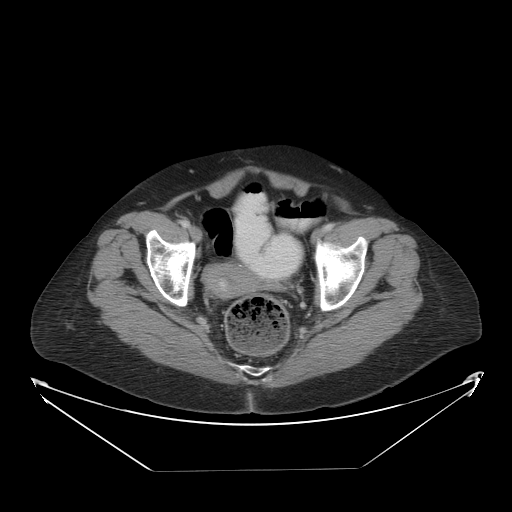
[im 25/88  soft-tissue]
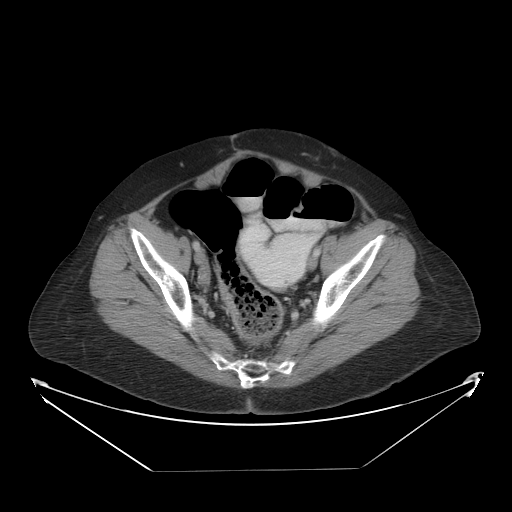
[im 30/88  soft-tissue]
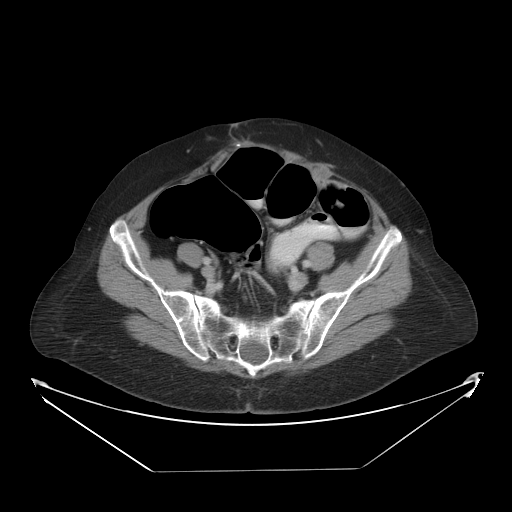
[im 39/88  soft-tissue]
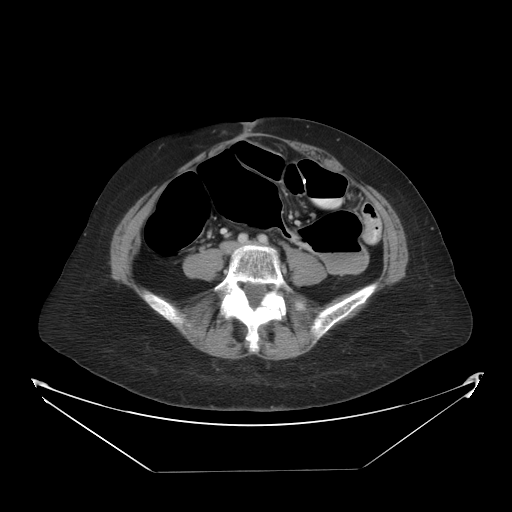
[im 44/88  soft-tissue]
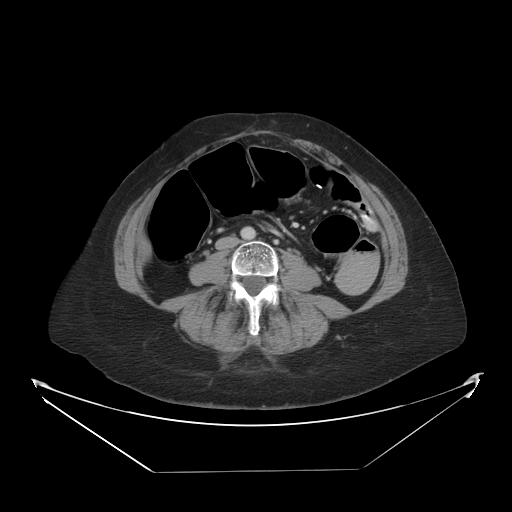
[im 49/88  soft-tissue]
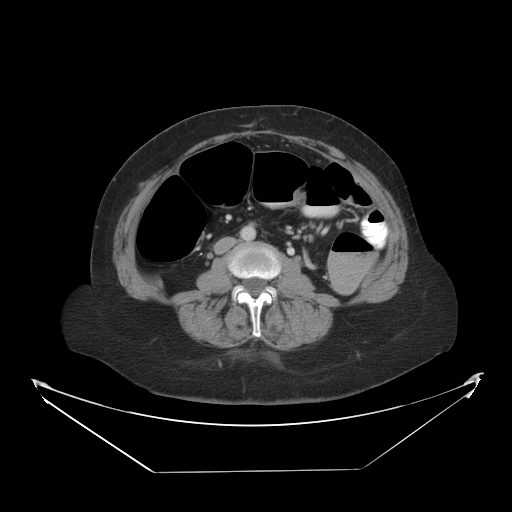
[im 59/88  soft-tissue]
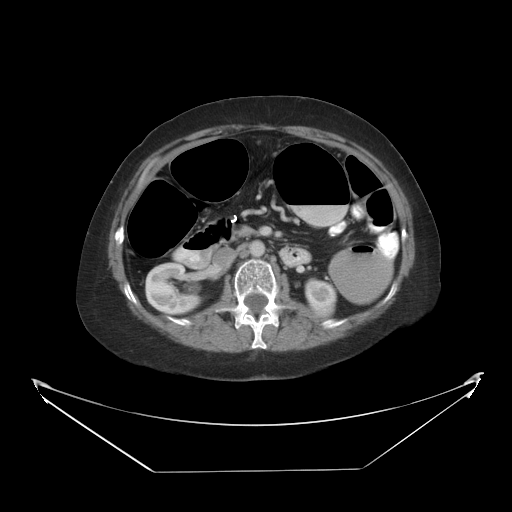
[im 59/88  bone]
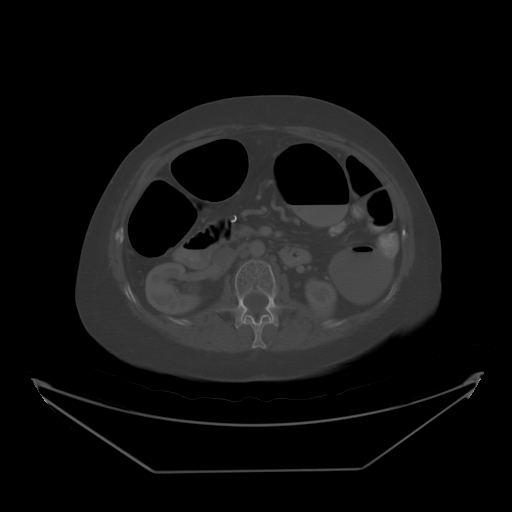
[im 63/88  soft-tissue]
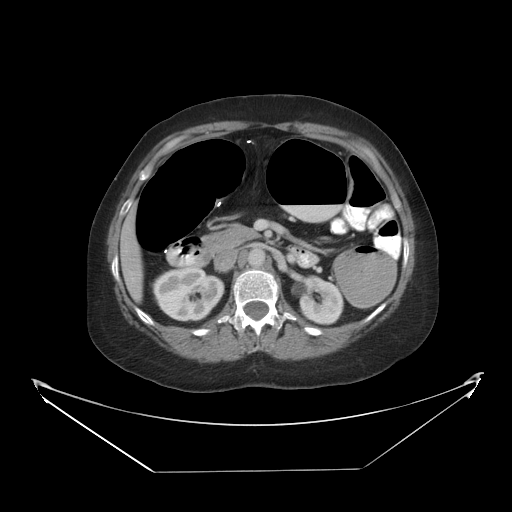
[im 68/88  soft-tissue]
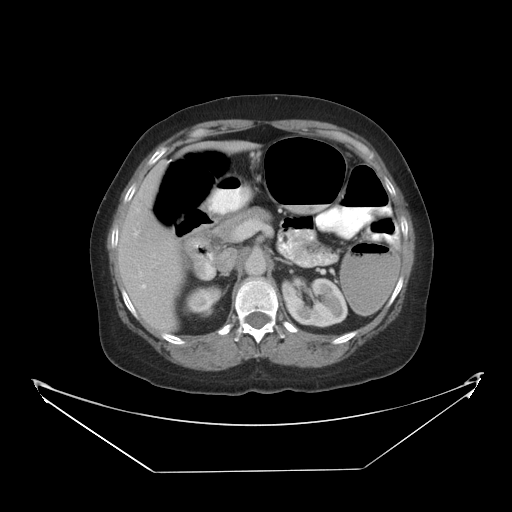
[im 78/88  soft-tissue]
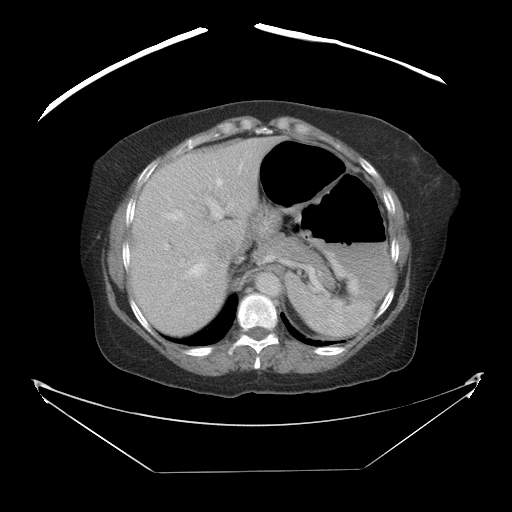
[im 83/88  soft-tissue]
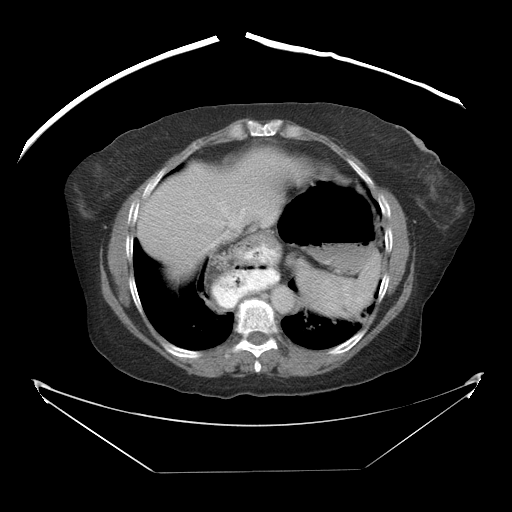

[Series 401: cor · coronal · 0.90mm/px · 3 of 97 slices shown]
[im 33/97  soft-tissue]
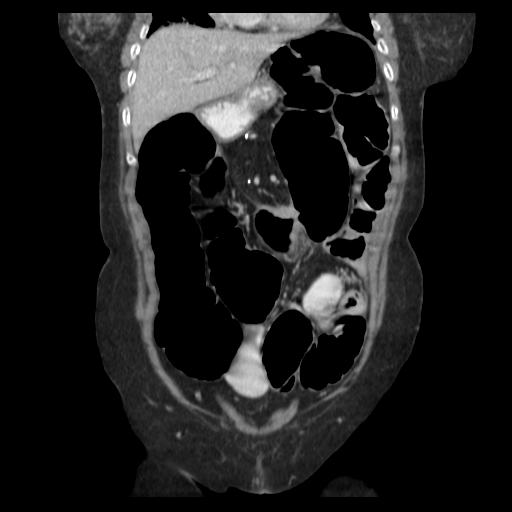
[im 43/97  soft-tissue]
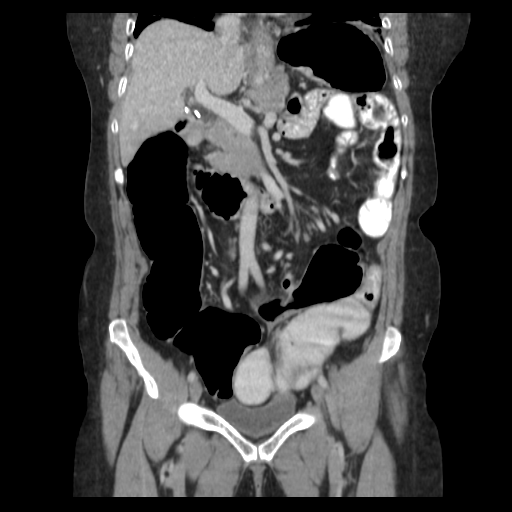
[im 54/97  soft-tissue]
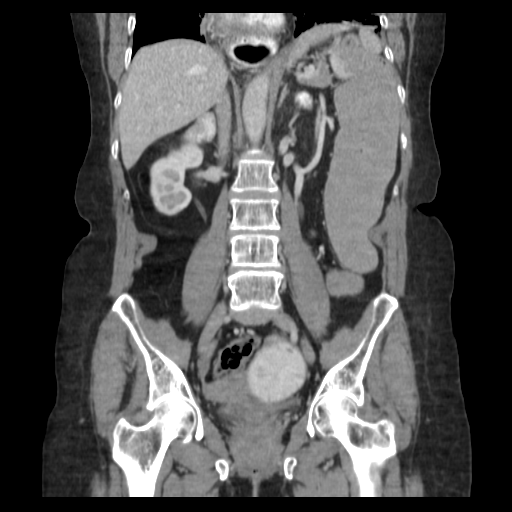

[16 of 46 positions shown; findings below may reference images not displayed]

FINDINGS: Mild patchy bibasilar airspace opacity likely reflects
atelectasis, although pneumonia cannot be entirely excluded.

A small 0.6 cm hypodensity is again noted within the right hepatic
lobe; this is not fully characterized on the current study, but
likely reflects a small hepatic cyst.  The liver is otherwise
unremarkable in appearance.  The spleen is within normal limits.
The patient is status post cholecystectomy, with clips noted at the
gallbladder fossa.  The pancreas is unremarkable; the adrenal
glands are within normal limits.

The kidneys are normal in appearance.  No hydronephrosis is seen;
no renal or ureteral stones are identified.  No perinephric
stranding is appreciated.  Small bilateral extrarenal pelves are
identified.

The patient is status post extensive bowel resection, involving
most of the small bowel and part of the colon.  There is diffuse
gaseous distension of the visualized remaining bowel, more
prominent than on the prior study and of uncertain etiology.  Stool
is noted within the rectum, with mild associated inflammatory
change.  The mild inflammation appears stable from the prior study,
and there is no sign of significant fecal impaction.  Given the
lack of distal obstruction, findings likely reflect ileus.

The more proximal small bowel is relatively decompressed,
containing a small amount of contrast.  A very large hiatal hernia
is noted, containing most of the stomach; this is unchanged in
appearance from the prior study, without evidence for volvulus at
this time.

No acute vascular abnormalities are seen.

The bladder is mildly distended and is unremarkable in appearance.
The uterus is within normal limits.  The adnexa are grossly
unremarkable; no suspicious adnexal masses are seen.  No inguinal
lymphadenopathy is appreciated.

Mild degenerative change is noted at the lower lumbar spine, with
mild fragmentation at the facet joints.  Tarlov cysts are again
identified at the upper sacrum.  No acute osseous abnormalities are
seen.
IMPRESSION: 1.  Diffuse distension of the remaining bowel, primarily with air.
No evidence of distal obstruction; stool noted in the rectum, with
mild stable associated inflammatory change.  No evidence of fecal
impaction.  Findings likely reflect ileus.
2.  Mild patchy bibasilar airspace opacity likely reflects
atelectasis, although pneumonia cannot be entirely excluded.
3.  Very large hiatal hernia, containing most of the stomach; no
evidence of volvulus at this time.
4.  Likely small stable hepatic cyst.
5.  Mild degenerative change at the lower lumbar spine, with Tarlov
cysts again seen at the upper sacrum.

## 2011-06-13 IMAGING — CR DG ABDOMEN 2V
2 series · 2 of 2 positions shown · non-contrast
Comparison: Prior today

CLINICAL DATA: Follow-up ileus.

ABDOMEN - 2 VIEW

[w abdomen upright]
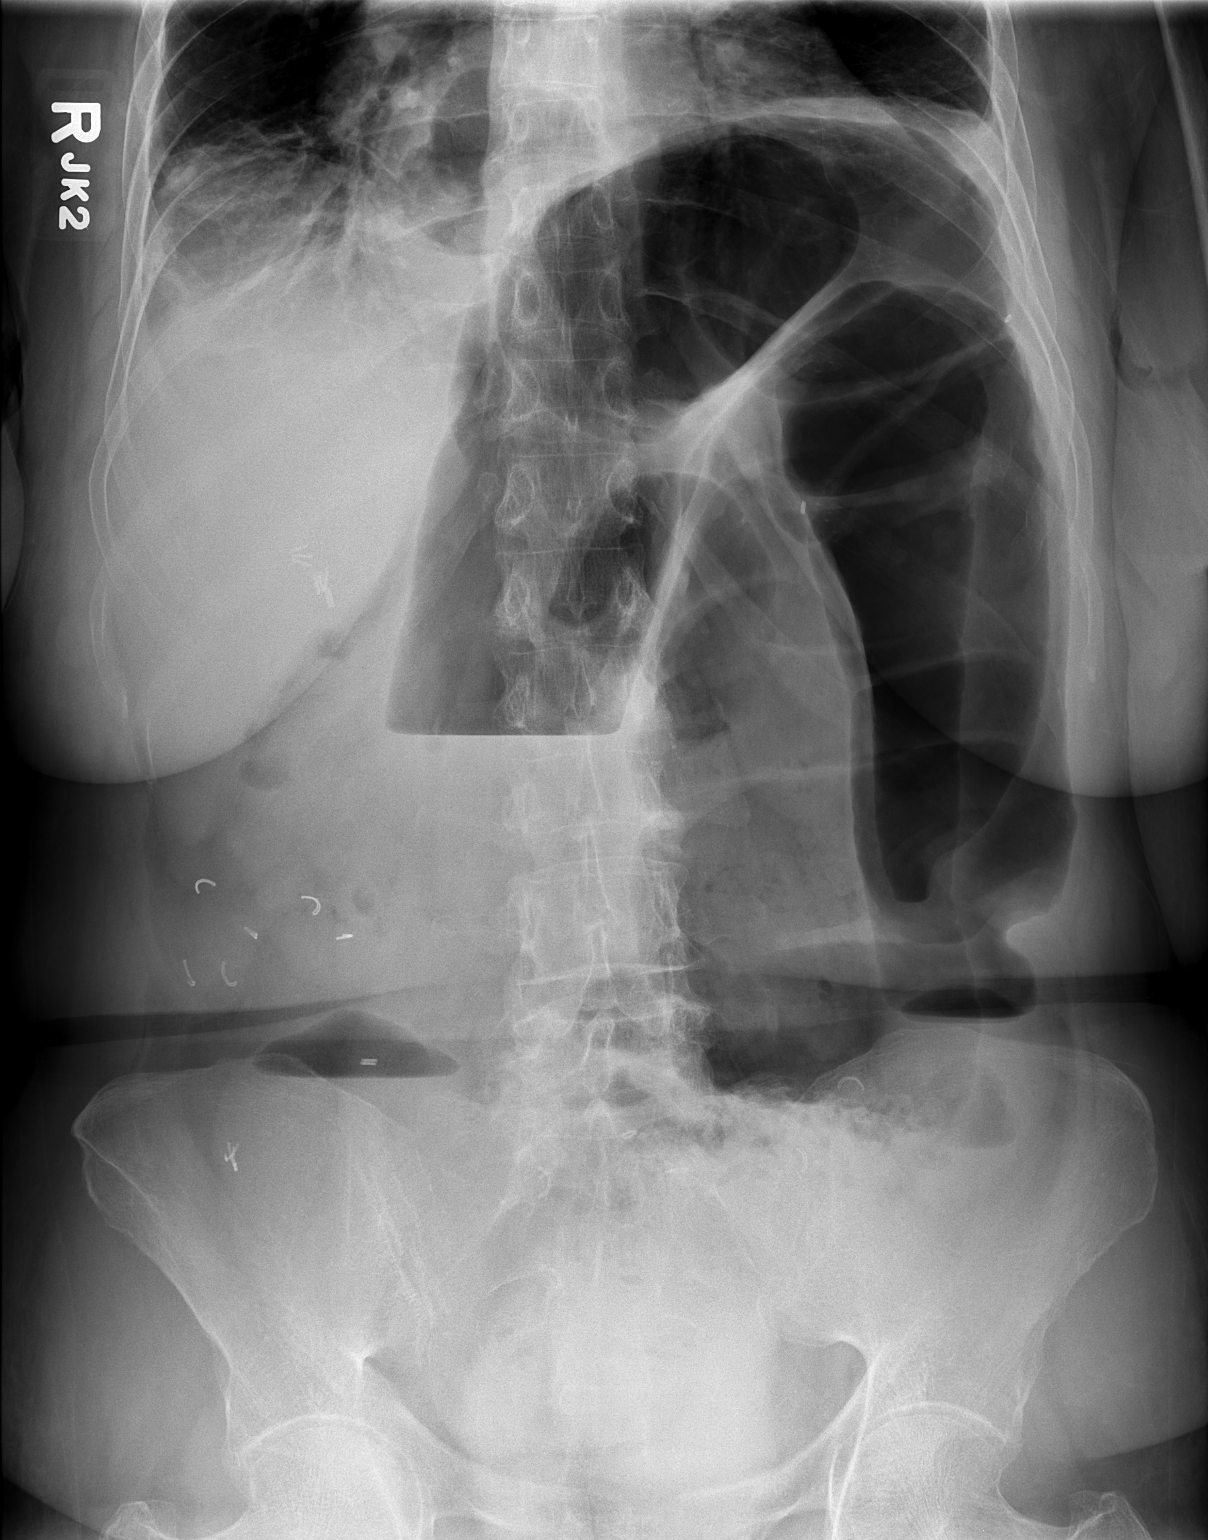

[t abdomen supine]
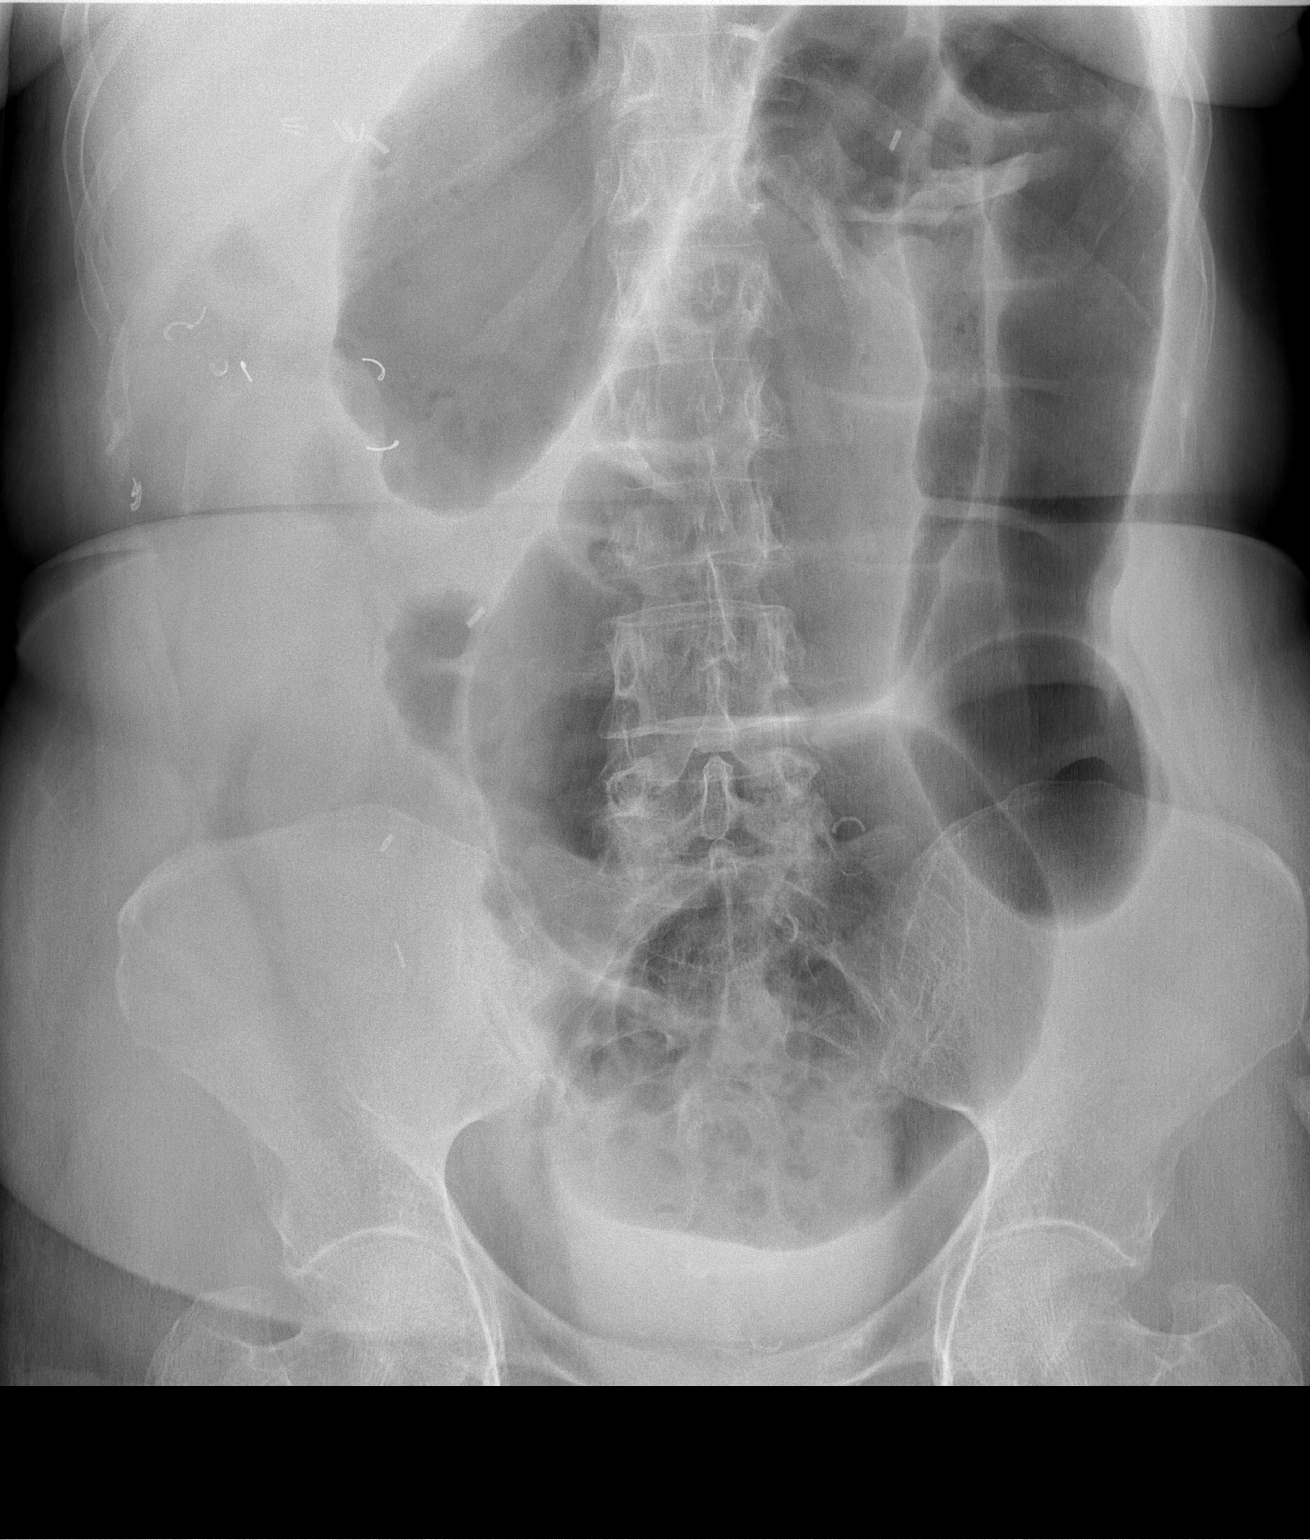

[2 of 2 positions shown; findings below may reference images not displayed]

FINDINGS: Colonic dilatation with multiple air-fluid levels shows
no significant change.  Contrast is again seen within the rectum.
Surgical clips are seen in the right abdomen.  No free air
identified.
IMPRESSION: Colonic ileus pattern, without significant change.

## 2011-06-14 IMAGING — CR DG ABDOMEN 2V
3 series · 3 of 3 positions shown · non-contrast
Comparison: 05/30/2010

CLINICAL DATA: Follow up ileus

ABDOMEN - 2 VIEW

[w abdomen upright *]
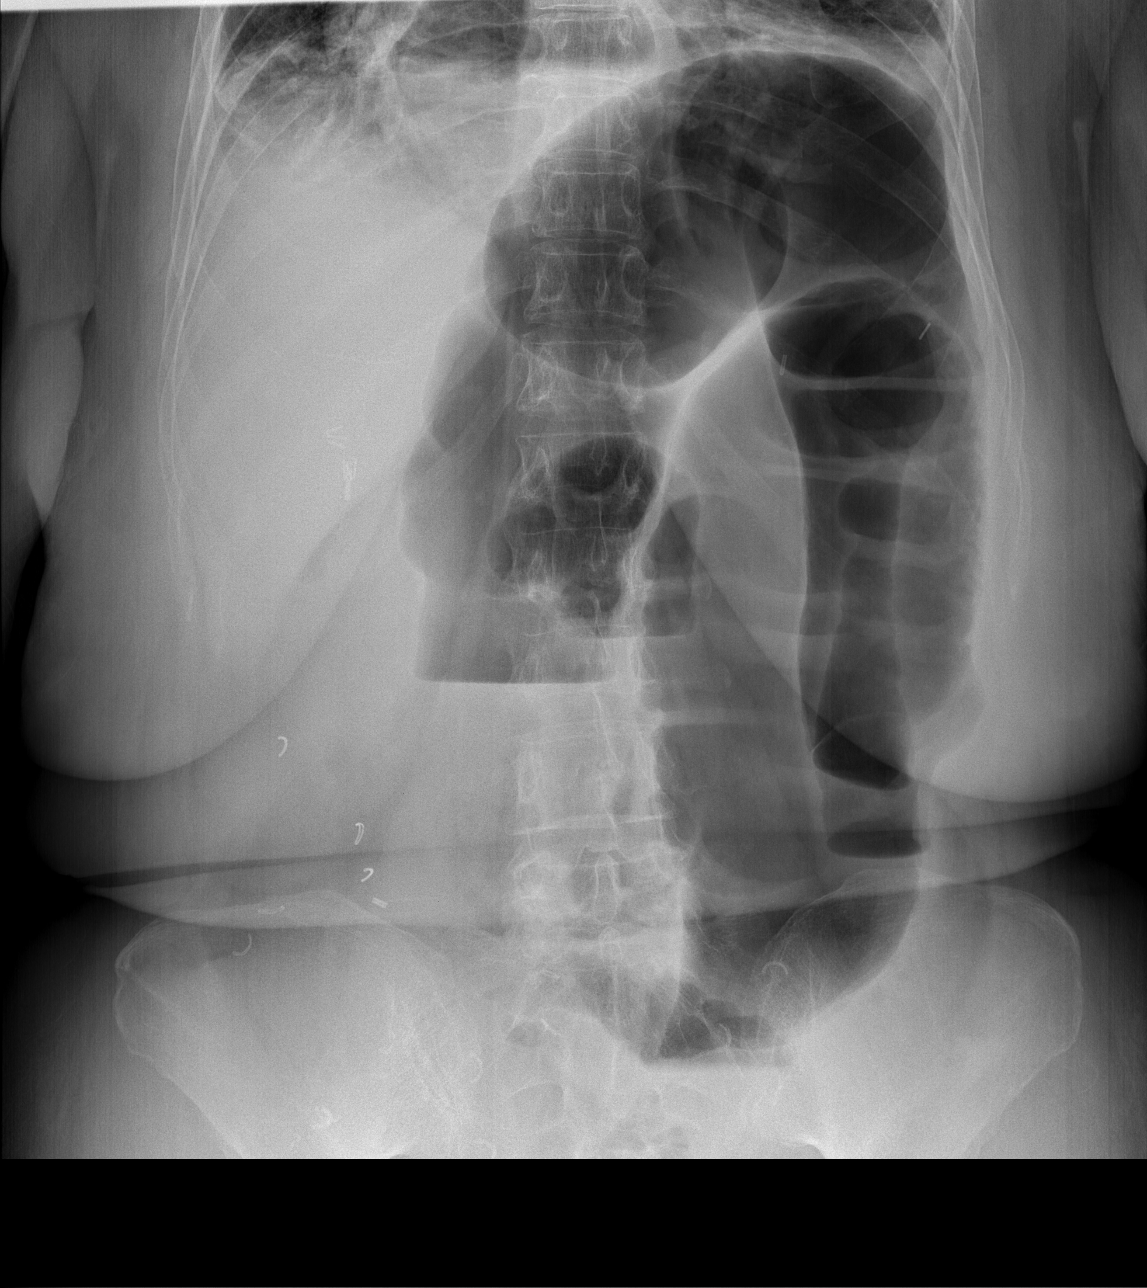

[t abdomen supine * (1 of 2)]
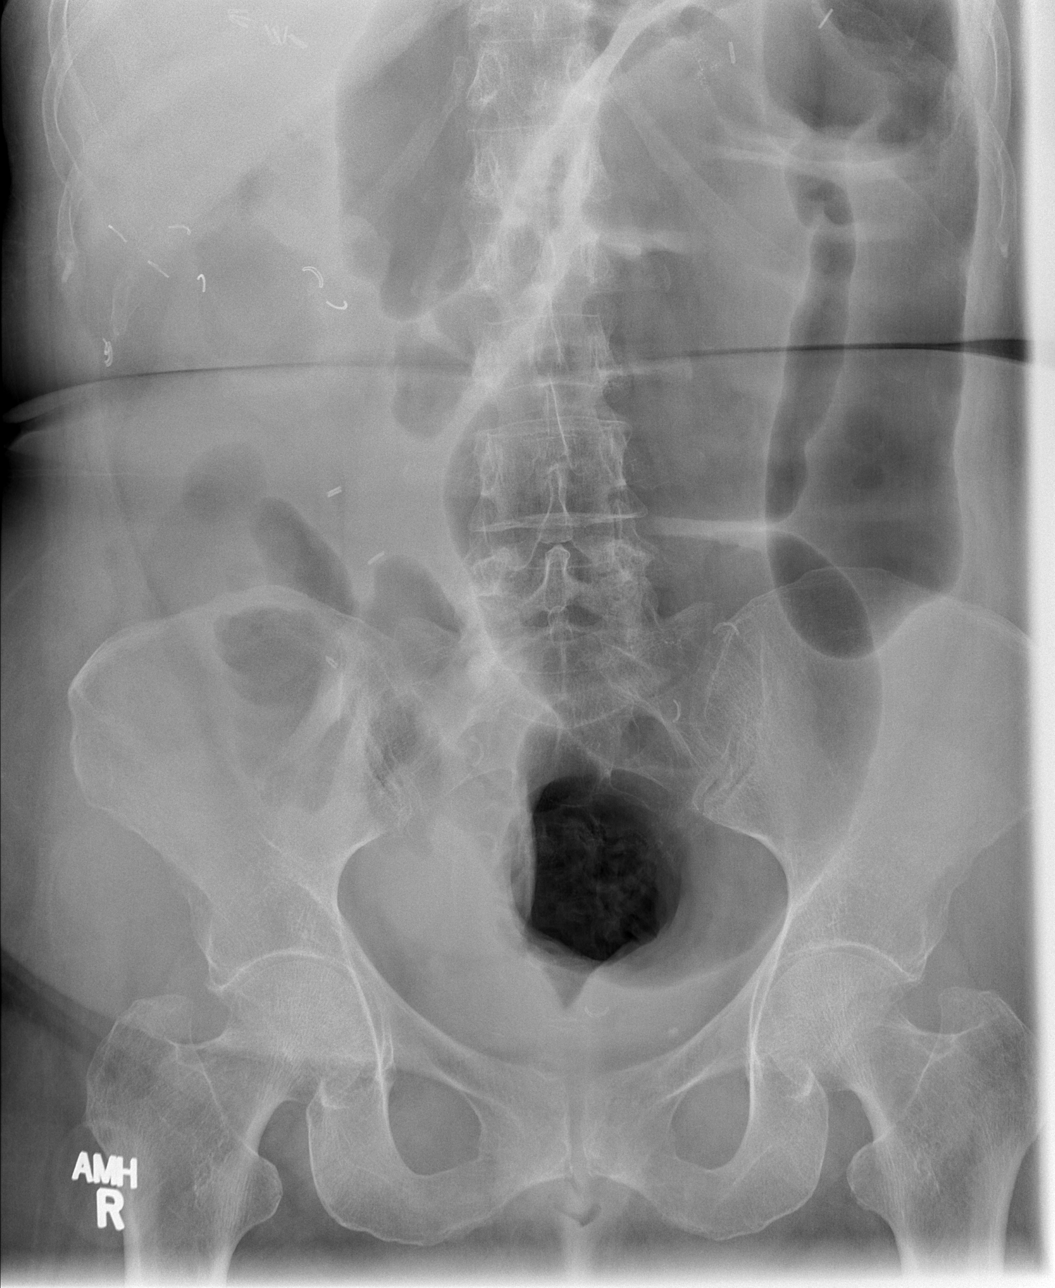

[t abdomen supine * (2 of 2)]
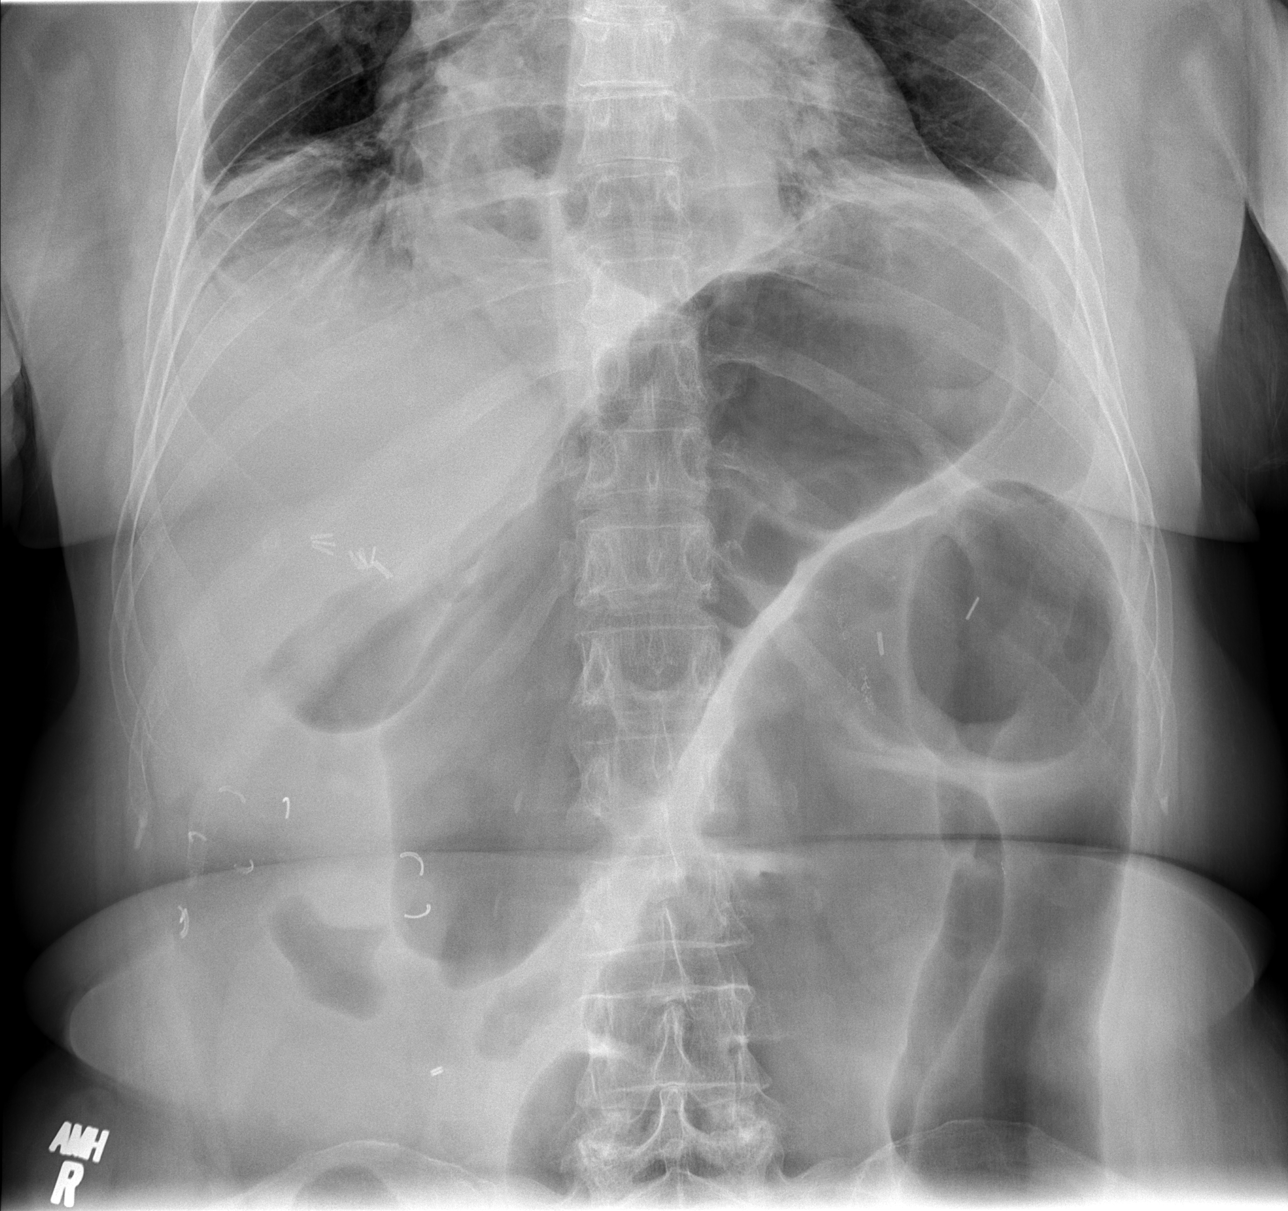

[3 of 3 positions shown; findings below may reference images not displayed]

FINDINGS: Colonic dilatation and multiple air-fluid levels are
again identified.  When compared with the previous examination the
degree of colonic dilatation is unchanged.

Surgical clips are noted within the right hemiabdomen.

No free intraperitoneal air identified.
IMPRESSION: 1.  Colonic ileus pattern without significant change.

## 2011-06-15 IMAGING — CR DG ABDOMEN 2V
3 series · 3 of 3 positions shown · non-contrast
Comparison: 05/31/2010

CLINICAL DATA: Follow-up ileus.

ABDOMEN - 2 VIEW

[w abdomen upright]
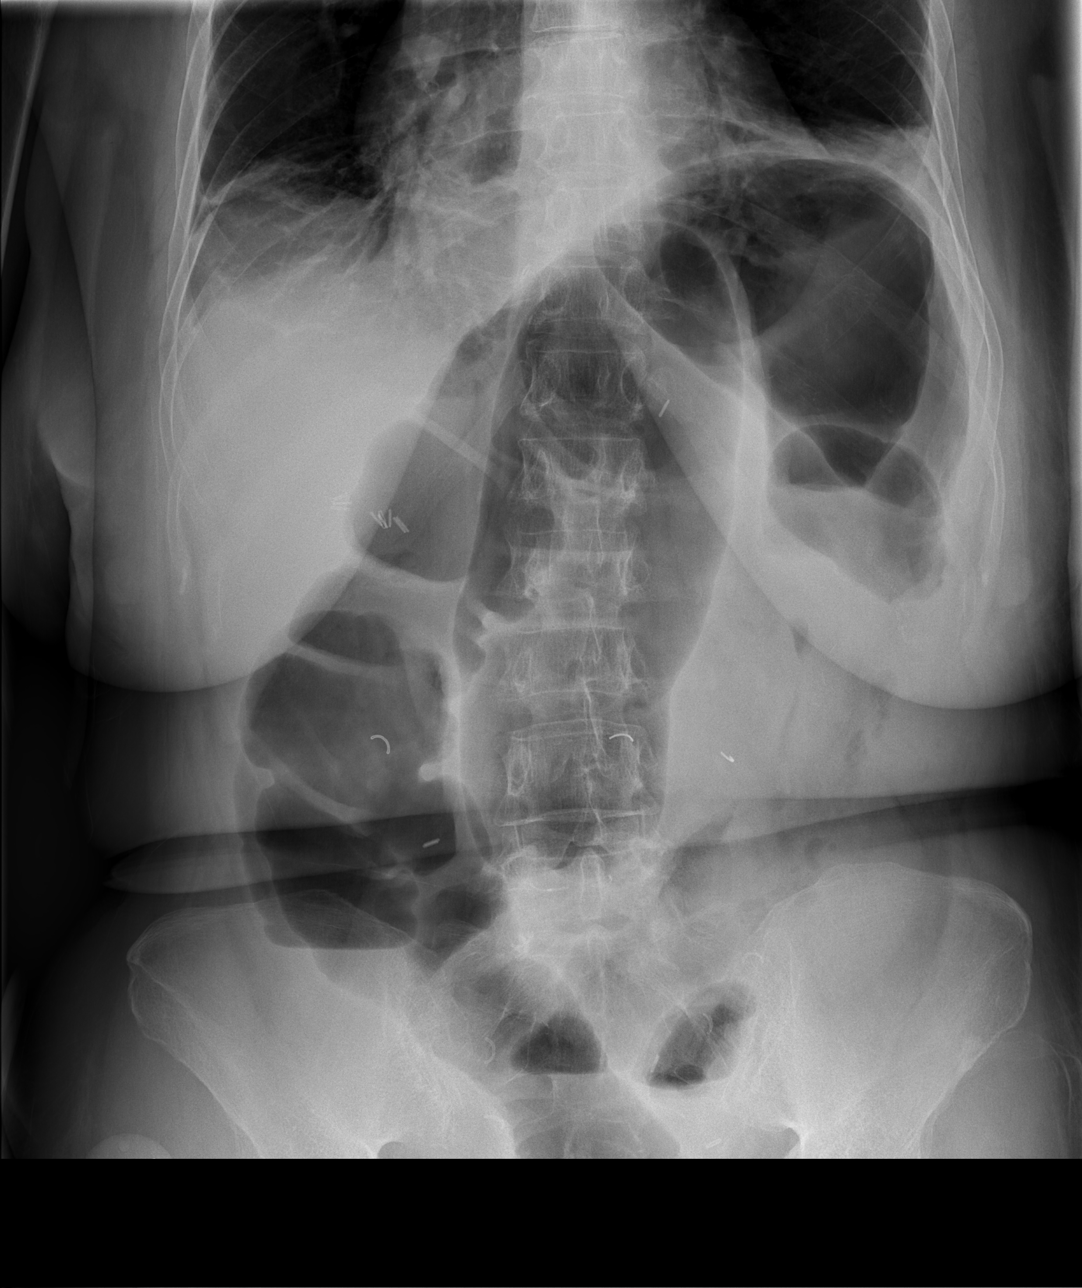

[t abdomen supine (1 of 2)]
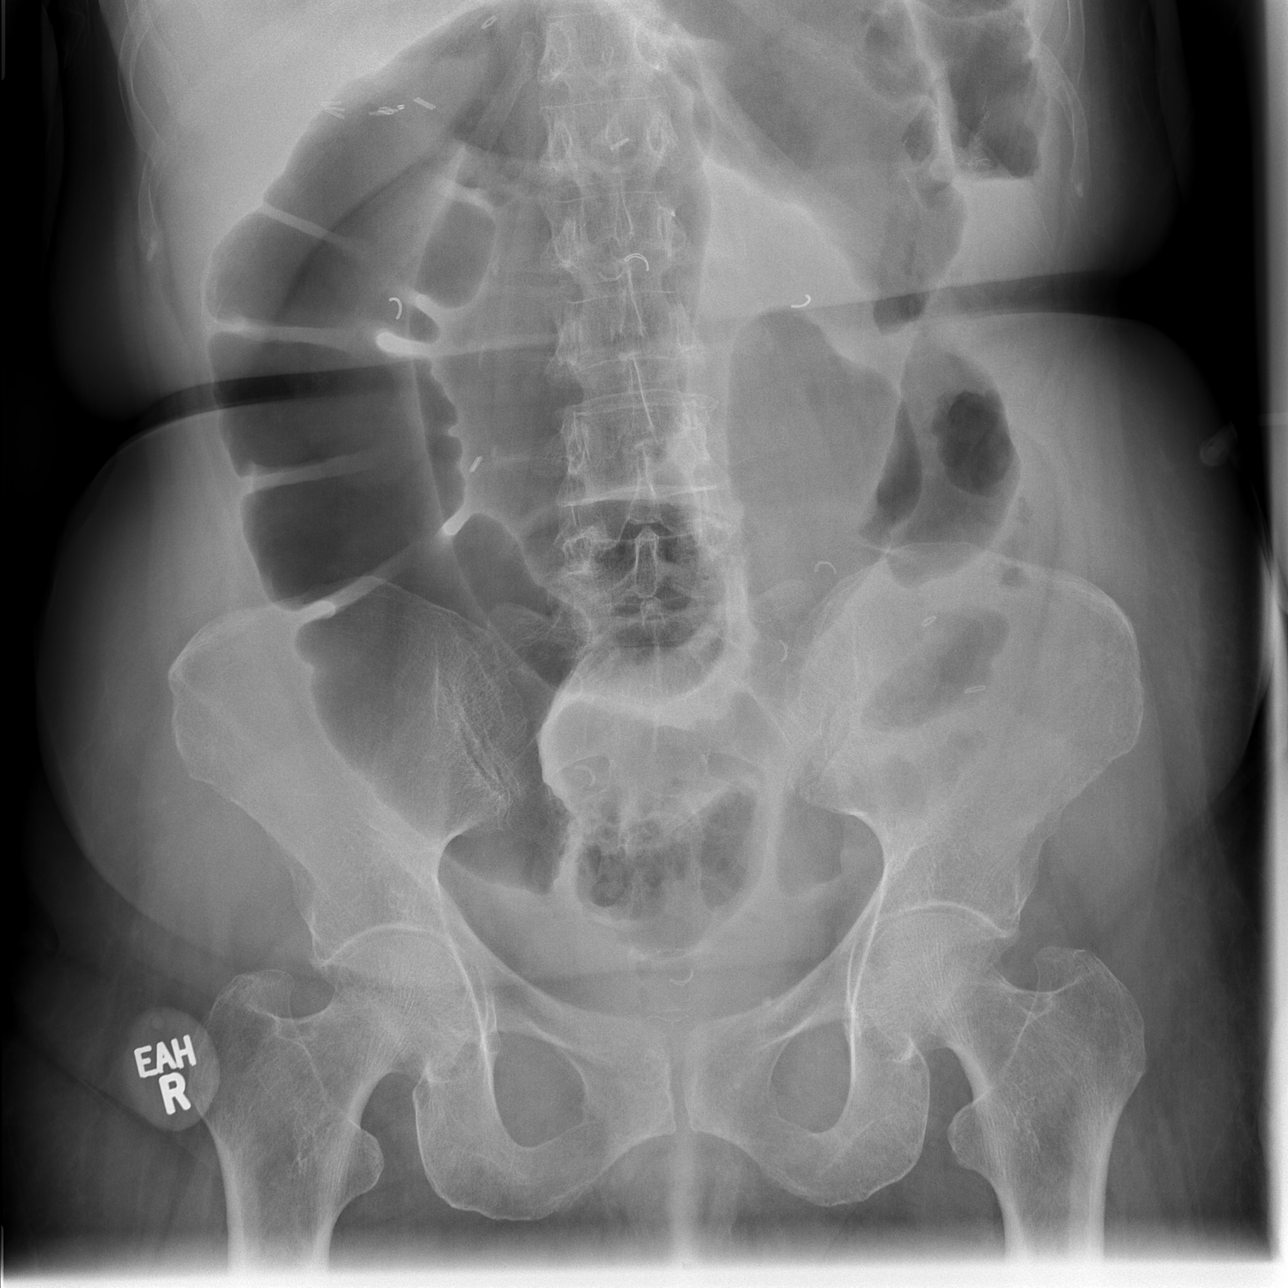

[t abdomen supine (2 of 2)]
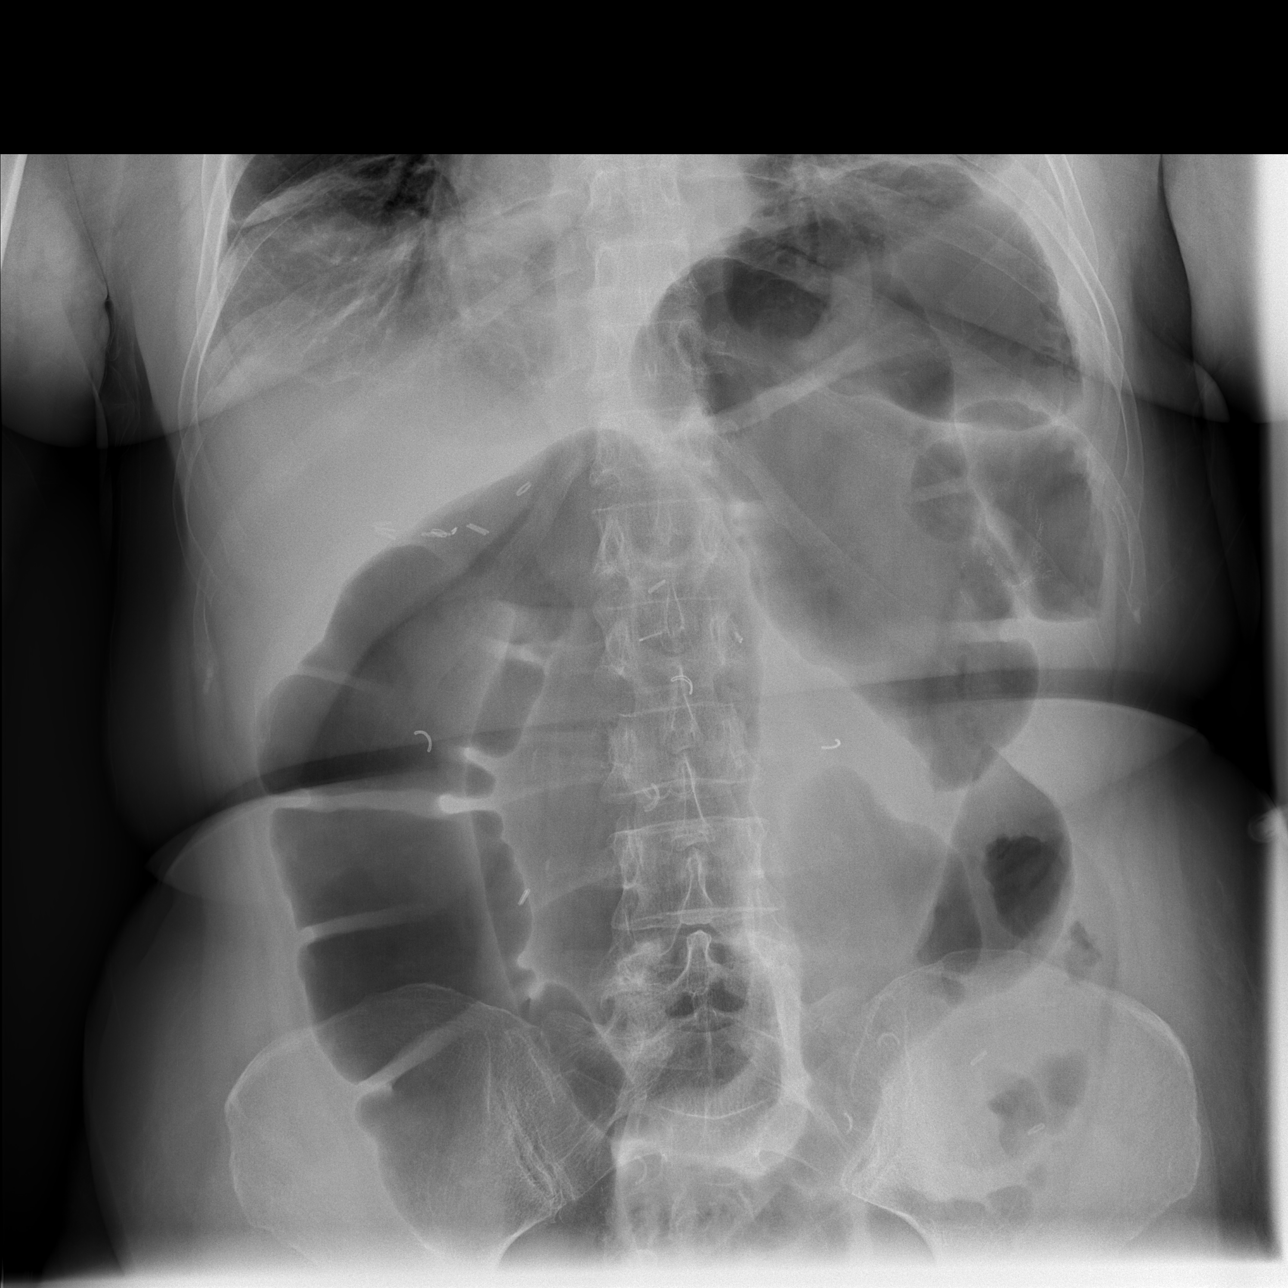

[3 of 3 positions shown; findings below may reference images not displayed]

FINDINGS: Diffuse gaseous distention of colon is again seen to the
level of the rectum.  This is consistent with colonic ileus.  No
evidence of dilated small bowel loops or free air.

Moderate size hiatal hernia again noted as well as bibasilar
infiltrates or atelectasis.
IMPRESSION: 1.  No significant change in diffuse colonic ileus pattern.
2.  Bibasilar atelectasis versus infiltrates and hiatal hernia
again noted.

## 2011-06-16 NOTE — Consult Note (Signed)
NAME:  Danielle [name], Danielle [name]                    ACCOUNT NO.:  MEDICAL RECORD NO.:  [id number]  LOCATION:                                 FACILITY:  PHYSICIAN:  Lionel December, M.D.    DATE OF BIRTH:  Feb 11, 1946  DATE OF CONSULTATION:  06/01/2011 DATE OF DISCHARGE:                                CONSULTATION   PRESENTING COMPLAINT:  Followup for Crohn disease.  The patient complains of diarrhea and painful defecation.  HISTORY OF PRESENT ILLNESS:  Danielle [name] is a 65 year old African American female patient of Dr. Yolanda Bonine, who is here for scheduled visit. She was last seen in [facility] in August 2011.  She has history of Crohn disease for over 20 years.  She has been on Remicade infusion since middle of last year.  She says Remicade only helps her but 4-6 weeks.  Within a day or two of receiving infusion, her stools became normal.  Gradually, she begins to have diarrhea and she will have 5-6 stools every day.  Stools are loose except soon after receiving Remicade fusion.  Even though her stools are loose, she has excruciating pain with defecation.  She states she is taking pain medication 2-3 times a day.  She has noted scant amount of rectal discharge.  She would like to get a refill on her Mycolog II cream.  She also complains of joint pain in small joints of both hands, shoulders, and knees.  Dr. Loleta Chance gave her tramadol which she took 4 times a day and experienced some rectal bleeding.  Now, she is taking once a day and rectal bleeding has stopped.  She denies morning joint stiffness.  She denies abdominal pain.  She says her appetite is good.  She has gained 9 pounds in the last 10 months.  She does not take any NSAIDs.  She denies nausea, vomiting, fever, chills, or heartburn.  She also not experienced any side effects with Remicade.  Her last PPD was in November 2011.  CURRENT MEDICATIONS: 1. KCl 40 mEq p.o. daily. 2. MVI daily. 3. Calcium 600. 4. Vitamin D 400 units daily. 5.  Lortab 7.5/500 t.i.d. p.r.n. 6. Vitamin B12 injection 1 mg IM q. monthly. 7. Mycolog II cream to perianal area b.i.d. p.r.n. 8. Protonix 40 mg p.o. q.a.m. 9. Phenergan 25 mg p.o. q.6 p.r.n. which she uses occasionally. 10.Remicade infusion 5 mg/kg every 8 weeks.  Last dose was on May 01, 2011. 11.Bentyl liquid 10 mg 4 times a day. 12.Probiotic one daily.  PAST MEDICAL HISTORY:  Her Crohn disease was diagnosed in 1989, initially was a small bowel disease.  She has had at least 4 bowel resections, the first one was by Dr. Elpidio Anis several years ago and she has had at least 3 more by Dr. Gabriel Cirri at [facility].  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  She also has perianal disease with multiple abscesses for which she has required I and D as well as Flagyl therapy.  The patient's first colonoscopy was in 2000, and most recently she had  one in February 2006, by Dr. Gabriel Cirri when she had her colostomy for a colostomy takedown.  Other surgeries include appendectomy and cholecystectomy.  In the past, she has been on , but unfortunately she has failed it, has been in the case with mesalamine.  Chronic GERD.  She is on replacement therapy with B12 because she has had more than 4 feet of her distal small bowel removed.  History of hypokalemia, possibly related to GI losses.  ALLERGIES:  To ASPIRIN, SULFA, and MORPHINE SULFATE.  FAMILY HISTORY:  She has paternal aunt, age 84 with Crohn disease and she has a first cousin on her dad side with Crohn disease.  SOCIAL HISTORY:  She is divorced.  She does not have any children.  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  She has never smoked cigarettes or drank alcohol.  OBJECTIVE:  VITAL SIGNS:  Weight 154 pounds, she is 64 inches tall, pulse 74 per minute,  blood pressure 106/68, temp is 98.1. HEENT:  Conjunctivae is pink.  Sclerae nonicteric.  Oropharyngeal mucosa is normal.  She has dentures in place.  No neck masses or thyromegaly noted. CARDIAC:  With regular rhythm.  Normal S1 and S3.  No murmur or gallop noted. LUNGS:  Clear to auscultation. ABDOMEN:  Symmetrical.  She has midline scar.  Bowel sounds are normal on palpation, soft abdomen without tenderness, organomegaly, or masses. RECTAL:  Limited with external inspection.  She had some redness to perianal skin and scant amount of discharge. EXTREMITIES:  No peripheral edema or clubbing noted.  ASSESSMENT: 1. Crohn disease involving distal small bowel and anorectal perianal     disease.  She is having very painful defecation to the point that     she is requiring pain medication every day.  Suspect this may be     due to refractory disease, but need to rule out neoplasm or a     stricture.  Her last colonoscopy was over 5 years ago.  It appears     that Remicade is only providing relief for 4-6 weeks.  Therefore,     interval needs to be decreased to every 6 weeks.  Next, would be to     increase the dose to 7.5/10/kg. 2. Polyarthralgia.  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  PLAN: 1. New prescription given for K-Dur 40 mEq daily 1 month, 5 refills. 2. Prescription also given for hydrocodone/APAP 7.5/500 t.i.d. p.r.n.     60 without refill. 3. Dicyclomine liquid 10 mg for each meal 90 doses with 2 refills. 4. Mycolog II cream 60 g to be used b.i.d. p.r.n.  We will schedule for colonoscopy at [facility] in near future.  We will request that Remicade infusion interval be changed to every 6 weeks.  She will have the following blood work at the time of colonoscopy, CBC, sed rate, CRP,  ANA, rheumatoid factor, Chem-20, and serum magnesium level.          ______________________________ Lionel December, M.D.     NR/MEDQ  D:  06/01/2011  T:  06/02/2011  Job:  [id number]  cc:   Annia Friendly. [place],  [place] Fax: [phone]

## 2011-06-18 IMAGING — CR DG ABDOMEN 2V
2 series · 2 of 2 positions shown · non-contrast
Comparison: 06/02/2010

CLINICAL DATA: Abdominal pain, ileus

ABDOMEN - 2 VIEW

[w abdomen upright]
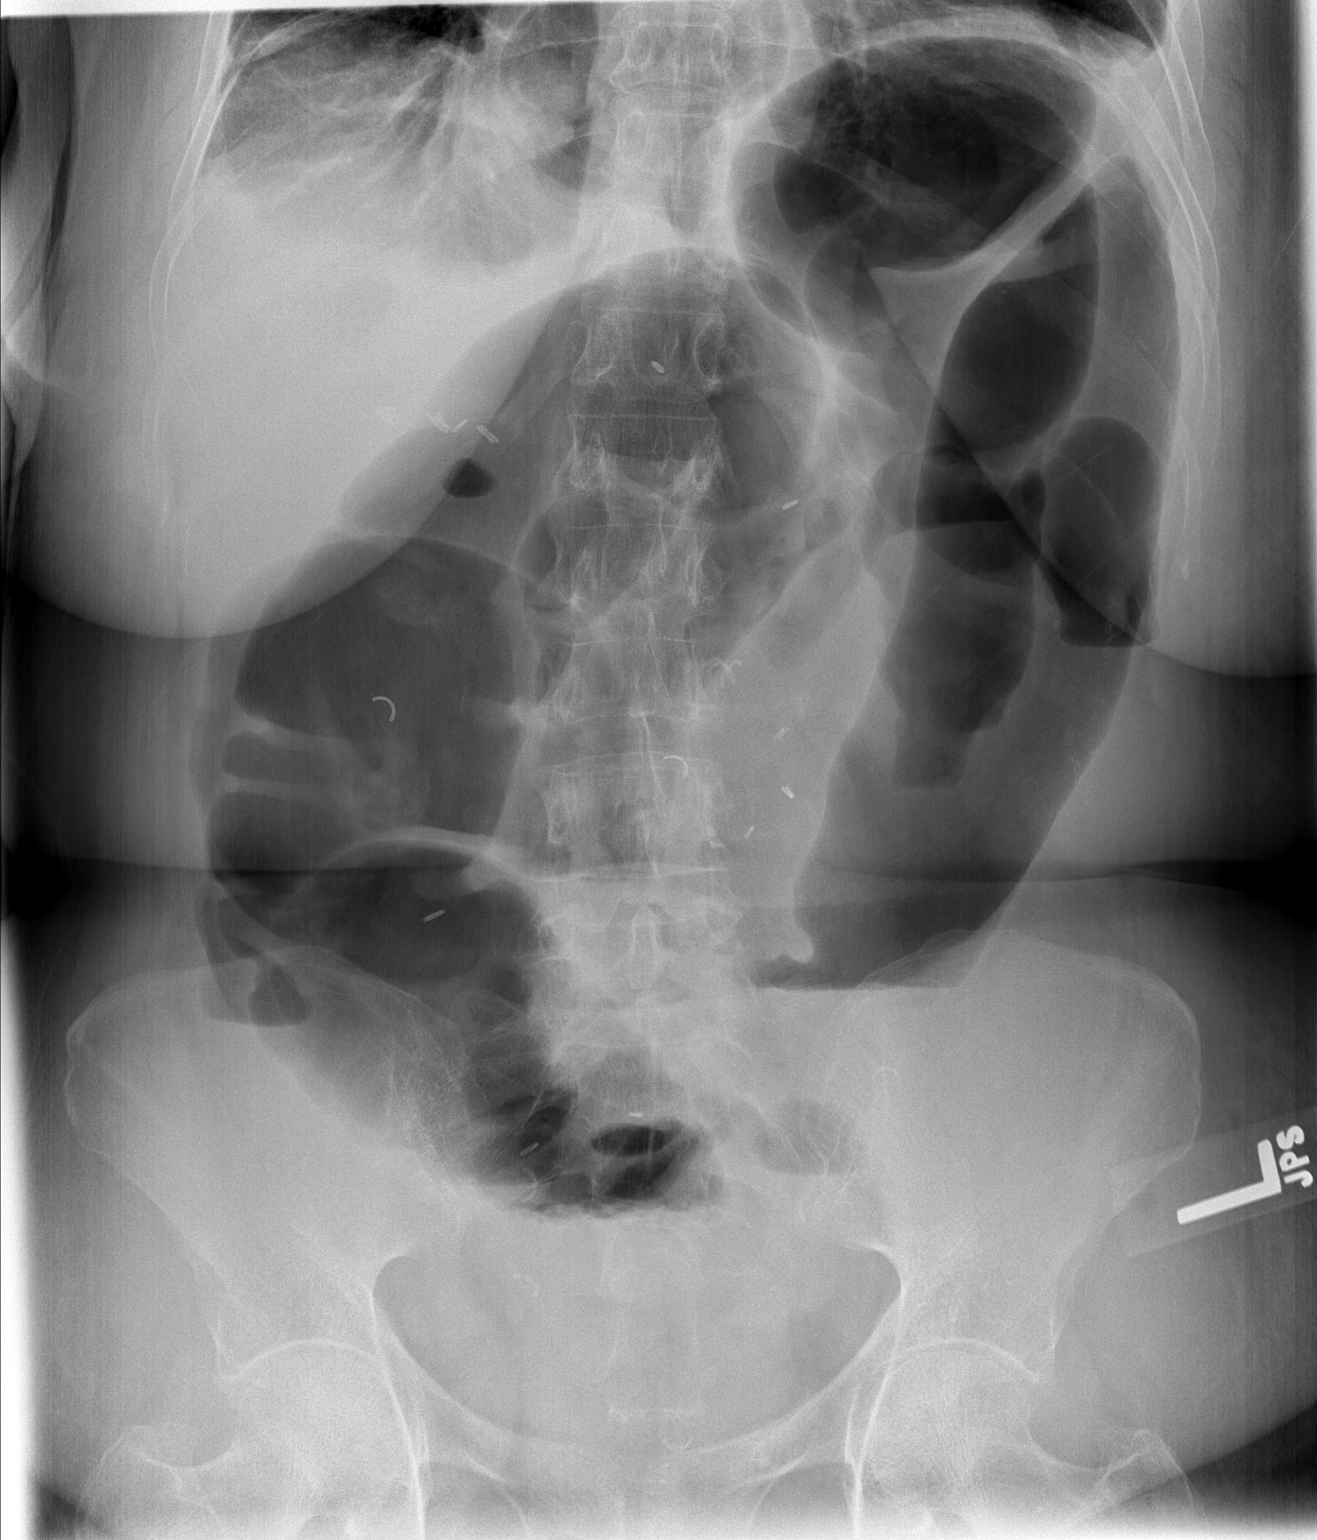

[t abdomen supine]
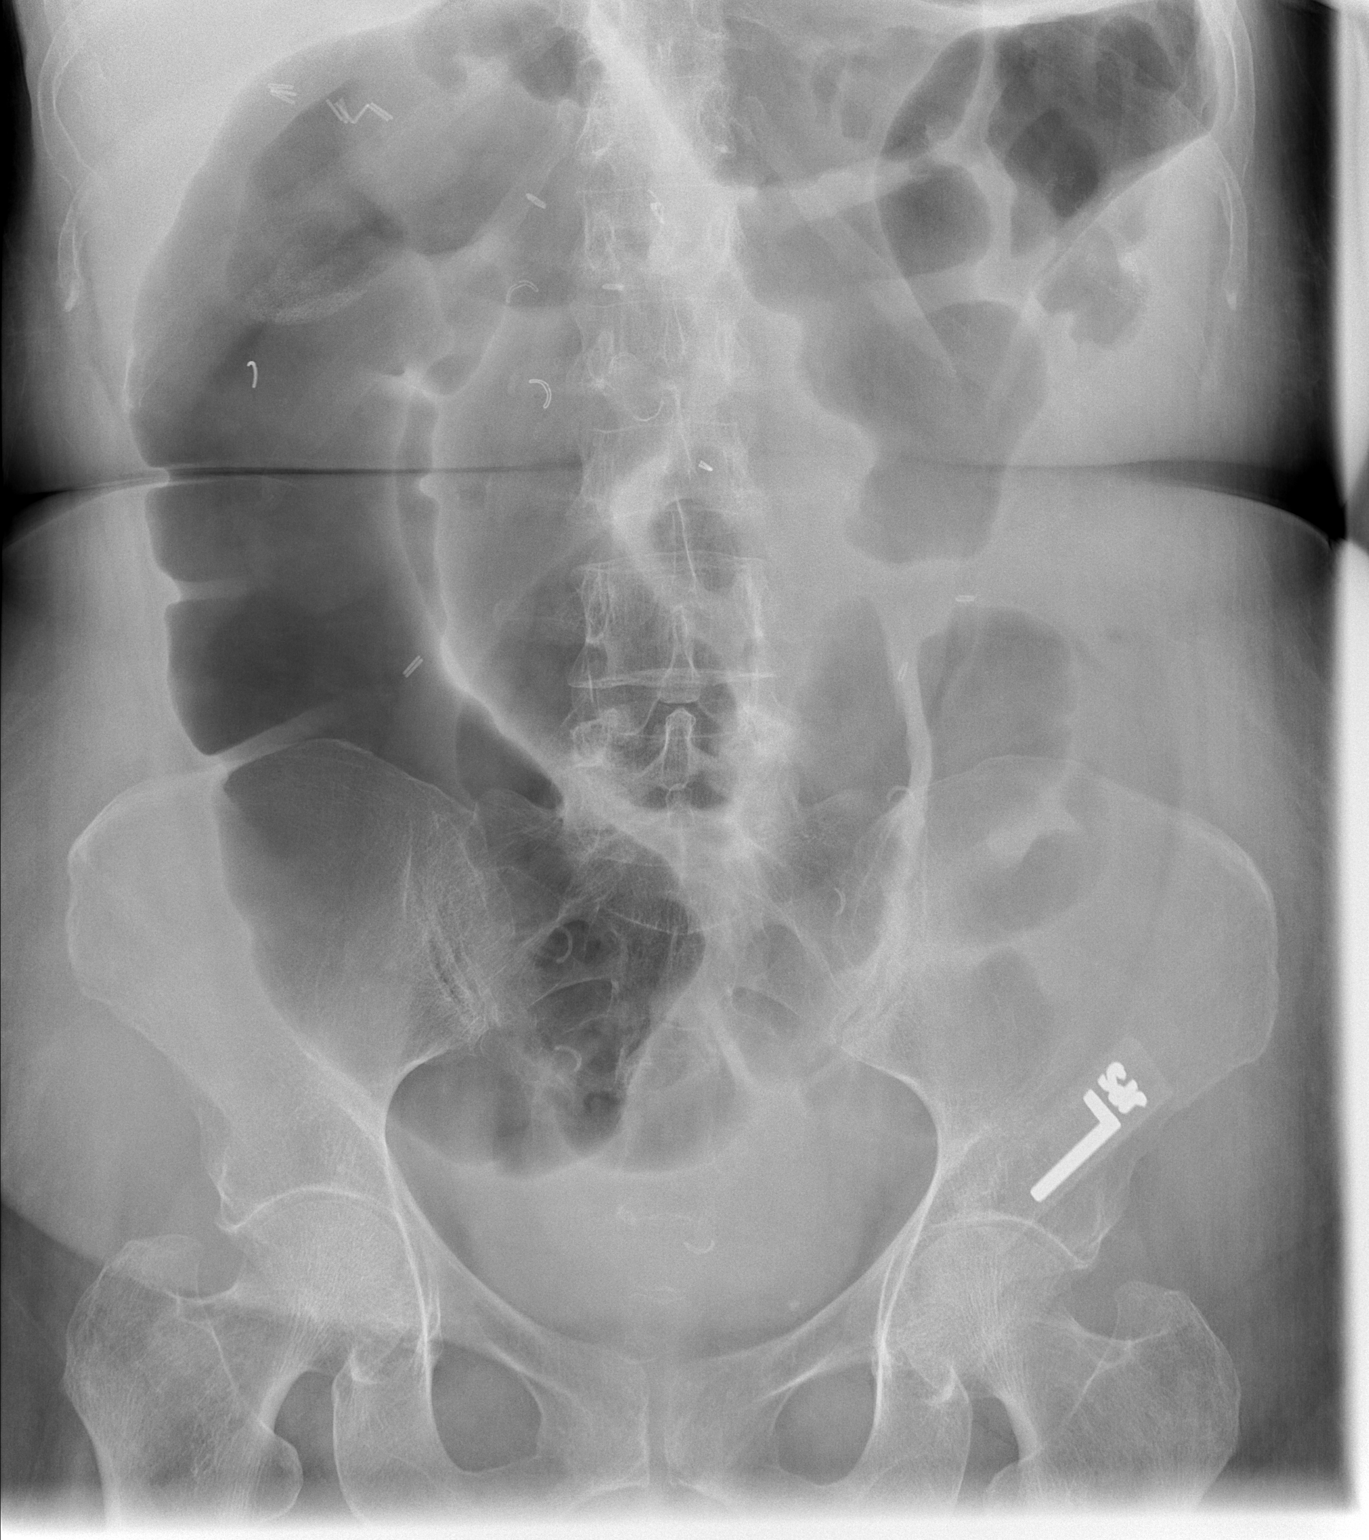

[2 of 2 positions shown; findings below may reference images not displayed]

FINDINGS: There is been further dilatation of the sigmoid colon
measuring up to 10 cm diameter distally.  There are scattered   gas
distended segments of more proximal colon. Small bowel appears less
dilated.  Air fluid levels in the colon on the erect film.  No free
air.  Small effusions and atelectasis/consolidation suspected in
the lung bases.  Scattered vascular clips in the mid abdomen.
IMPRESSION: 1.  Persistent colonic ileus

## 2011-06-19 IMAGING — CR DG CHEST 1V PORT
1 series · 1 of 1 positions shown · non-contrast
Comparison: 05/28/2010

CLINICAL DATA: Right PICC line placement.

PORTABLE CHEST - 1 VIEW

[view not recorded]
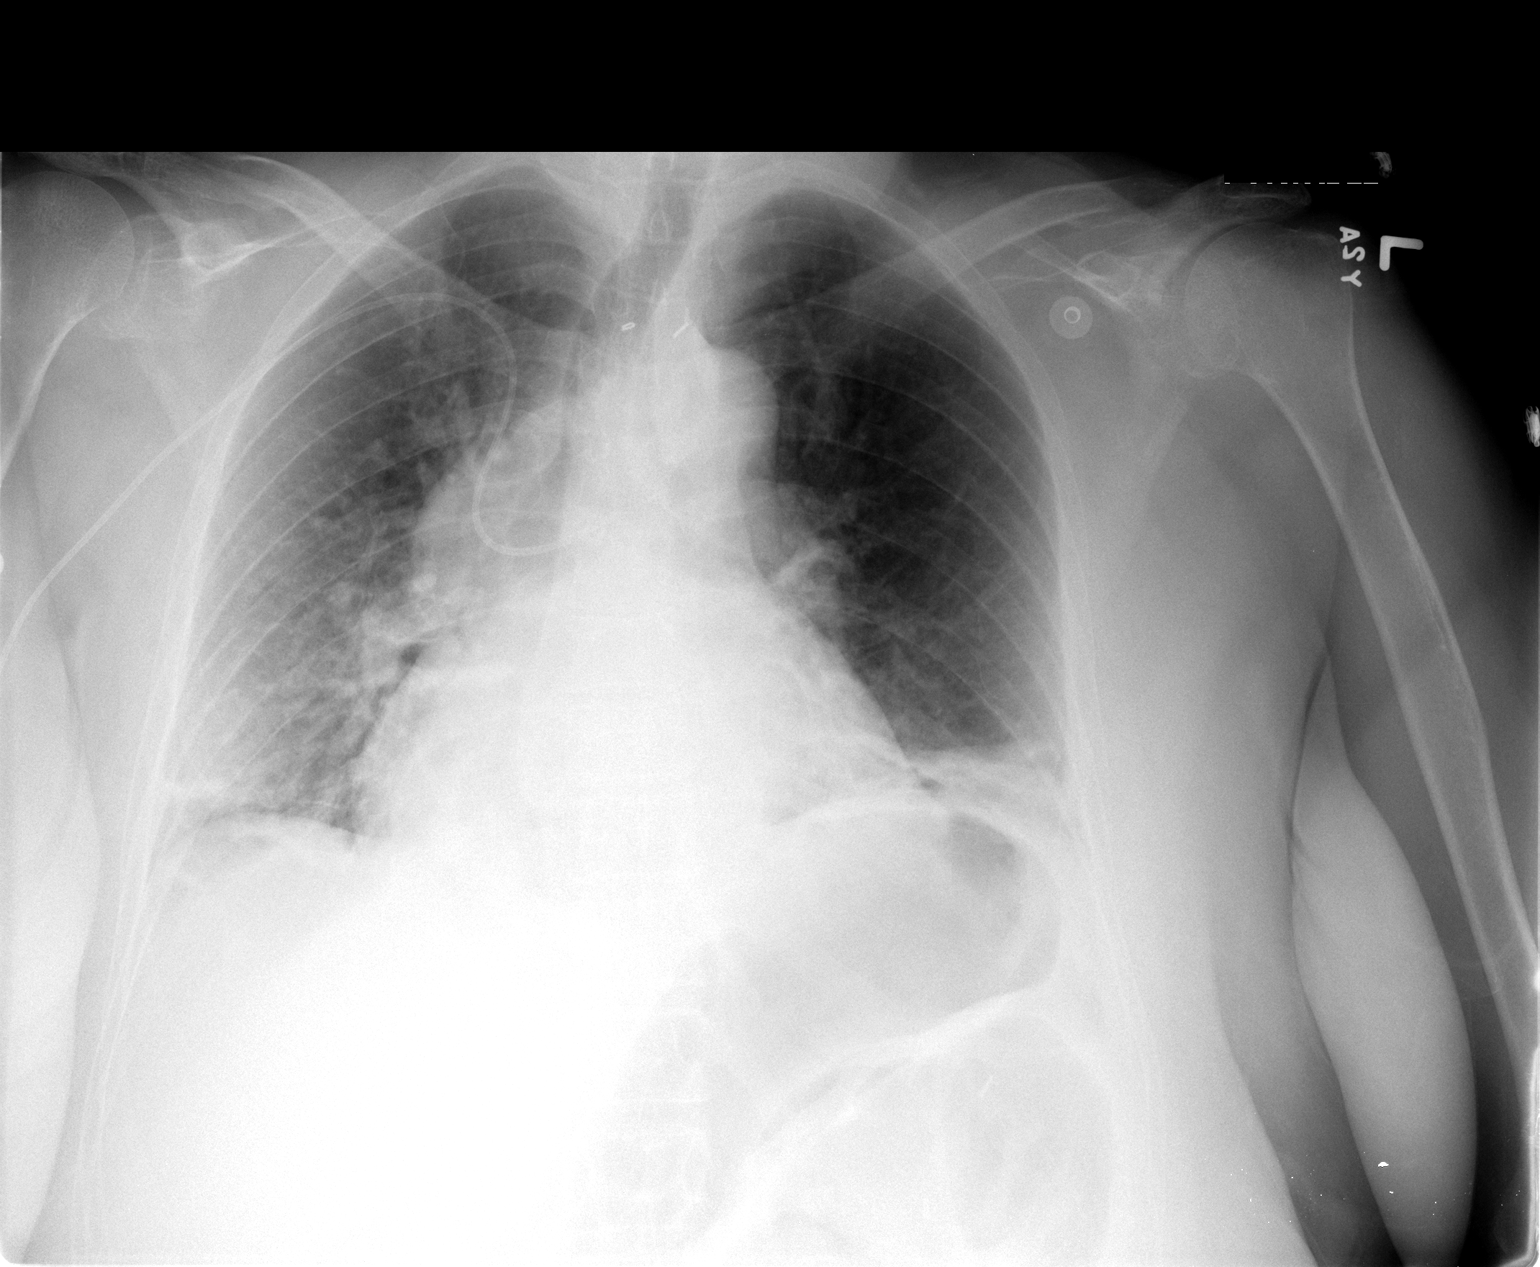

[1 of 1 positions shown; findings below may reference images not displayed]

FINDINGS: Right-sided PICC line enters the right innominate vein
and crosses over into the left innominate vein.  Recommend
retraction of the catheter by  4 cm followed by readvancement, if
possible.  Lungs show increased bibasilar atelectasis since the
prior chest x-ray.
IMPRESSION: Right-sided PICC line crosses at the level of the innominate
confluence into the left innominate vein.  Recommend initial
adjustment by retraction of the catheter by roughly 4 cm followed
by readvancement, if possible.

## 2011-06-19 IMAGING — CR DG CHEST 1V PORT
1 series · 1 of 1 positions shown · non-contrast
Comparison: 4345 hours the same day and earlier.

CLINICAL DATA: 63-year-old female with right PICC line
repositioning.

PORTABLE CHEST - 1 VIEW

[AP]
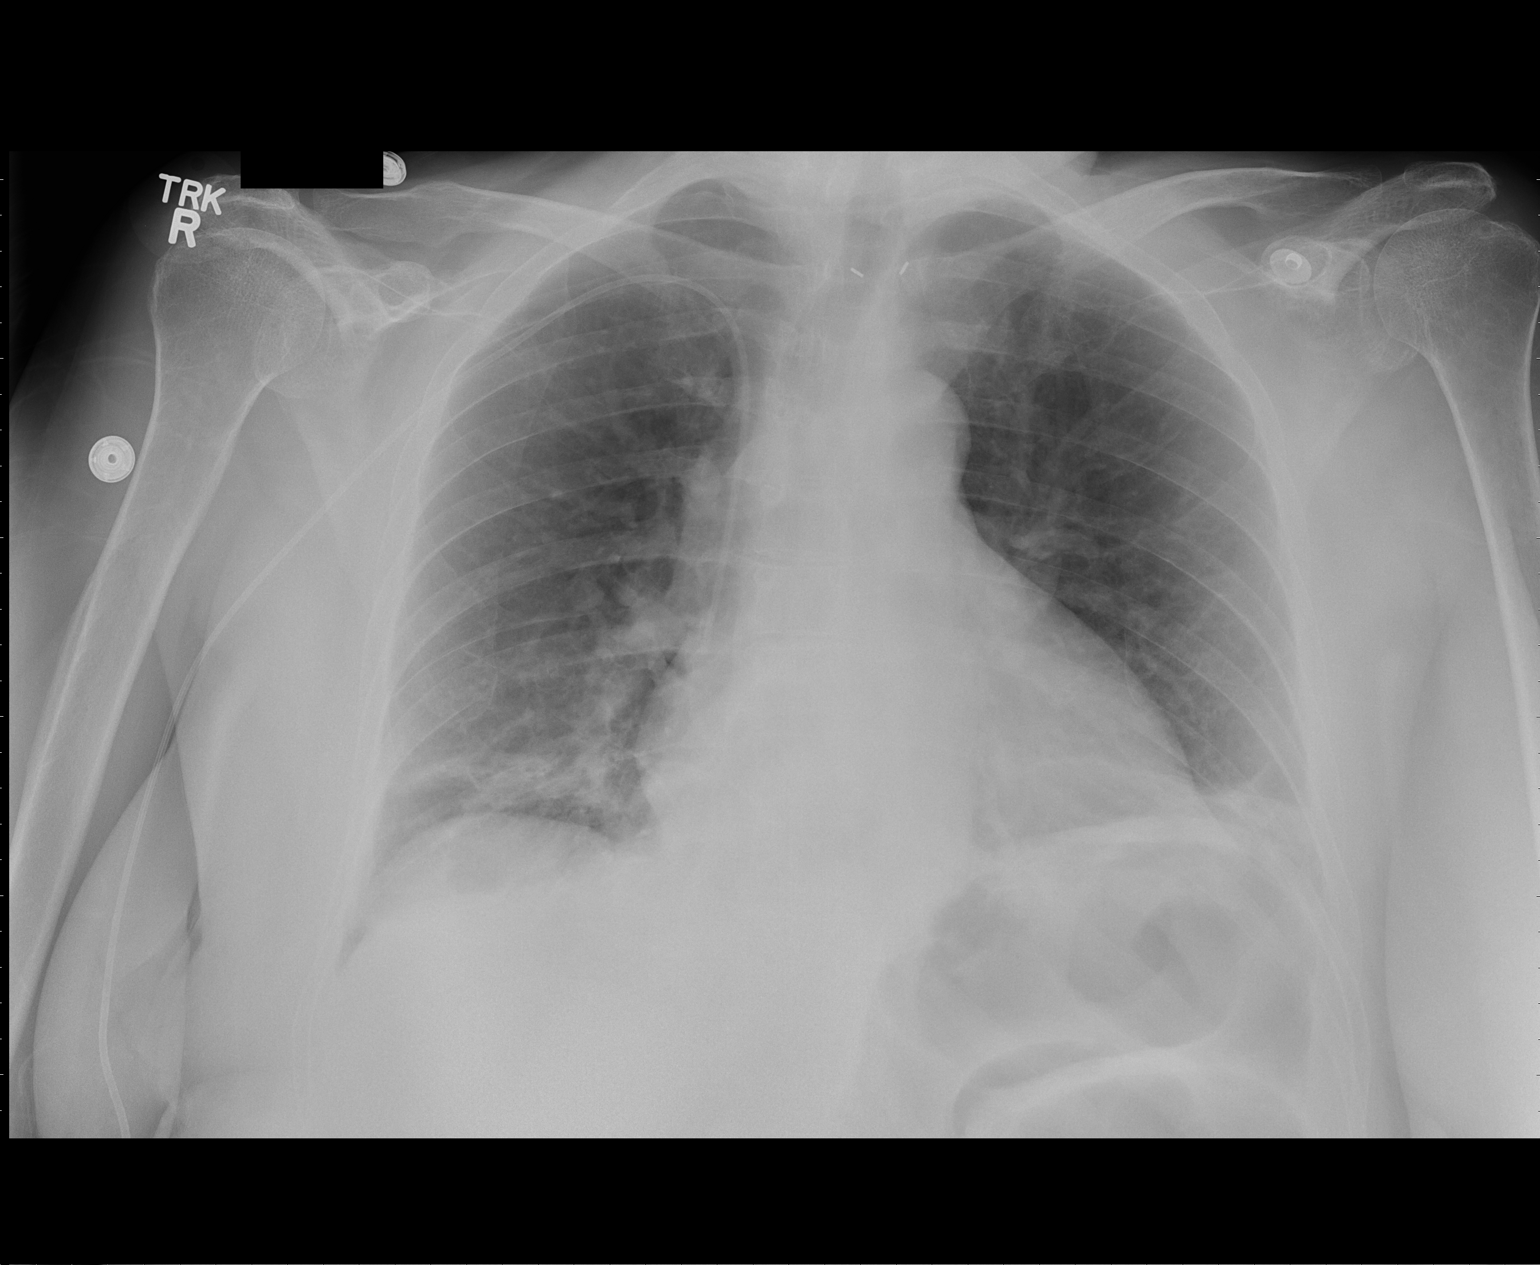

[1 of 1 positions shown; findings below may reference images not displayed]

FINDINGS: Semi upright AP portable view 8885 hours.  There is now
satisfactory course of the right upper extremity PICC line, tip at
the level of the cavoatrial junction.  No pneumothorax.  Otherwise
stable lungs.
IMPRESSION: Satisfactory placement of right PICC line, tip at the level of the
cavoatrial junction.  Otherwise stable chest.

## 2011-06-20 IMAGING — CR DG ABDOMEN 2V
1 series · 1 of 1 positions shown · non-contrast
Comparison: Abdominal radiograph performed 06/04/2010

CLINICAL DATA: Nausea, vomiting and abdominal pain; history of
bowel resections and Crohn's disease.  Follow up ileus.

ABDOMEN - 2 VIEW

[view not recorded]
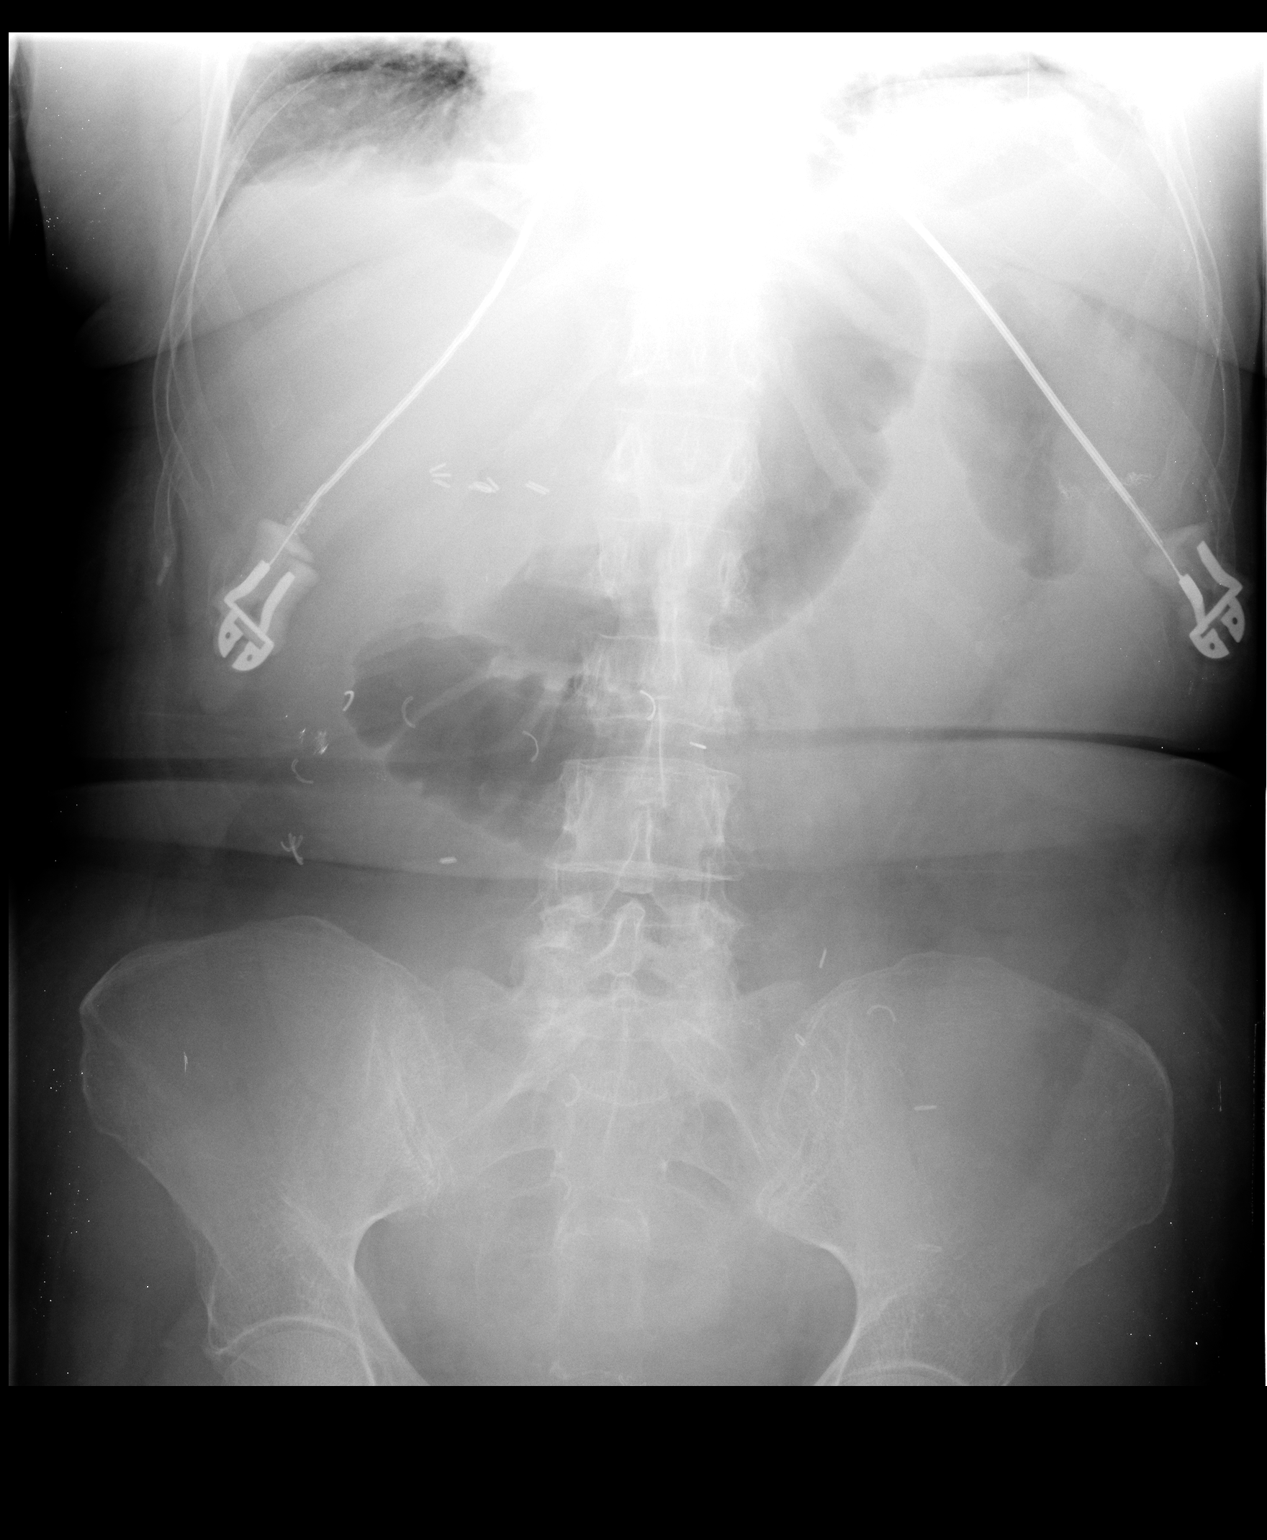

[1 of 1 positions shown; findings below may reference images not displayed]

FINDINGS: Previous noted colonic distension has largely resolved;
the bowel is mostly filled with fluid, with a relative paucity of
bowel gas, except in the stomach and a loop of distended proximal
small bowel. The presence of fluid within the colon suggests
against high-grade obstruction. No free intra-abdominal air is
identified on the provided decubitus view.  Scattered postoperative
change is noted about the abdomen.

No acute osseous abnormalities are seen.
IMPRESSION: Interval resolution of colonic distension; bowel mostly filled with
fluid, with a relative paucity of bowel gas.  Nonspecific
distension of a single loop of proximal small bowel, without
definite evidence for high-grade bowel obstruction.  No evidence of
free intra-abdominal air.

## 2011-06-23 ENCOUNTER — Ambulatory Visit (HOSPITAL_COMMUNITY): Payer: Medicare Other

## 2011-06-29 ENCOUNTER — Encounter (INDEPENDENT_AMBULATORY_CARE_PROVIDER_SITE_OTHER): Payer: Self-pay | Admitting: *Deleted

## 2011-07-02 ENCOUNTER — Emergency Department (HOSPITAL_COMMUNITY)
Admission: EM | Admit: 2011-07-02 | Discharge: 2011-07-03 | Disposition: A | Discharge status: Home / Self Care | Payer: Medicare Other | Attending: Emergency Medicine | Admitting: Emergency Medicine

## 2011-07-02 DIAGNOSIS — R109 Unspecified abdominal pain: Secondary | ICD-10-CM | POA: Insufficient documentation

## 2011-07-02 DIAGNOSIS — K5090 Crohn's disease, unspecified, without complications: Secondary | ICD-10-CM | POA: Insufficient documentation

## 2011-07-02 DIAGNOSIS — E876 Hypokalemia: Secondary | ICD-10-CM | POA: Insufficient documentation

## 2011-07-02 DIAGNOSIS — Z9089 Acquired absence of other organs: Secondary | ICD-10-CM | POA: Insufficient documentation

## 2011-07-02 LAB — DIFFERENTIAL
Basophils Absolute: 0 10*3/uL (ref 0.0–0.1)
Eosinophils Relative: 1 % (ref 0–5)
Lymphocytes Relative: 24 % (ref 12–46)
Lymphs Abs: 2.7 10*3/uL (ref 0.7–4.0)
Neutro Abs: 7.1 10*3/uL (ref 1.7–7.7)
Neutrophils Relative %: 66 % (ref 43–77)

## 2011-07-02 LAB — CBC
HCT: 37.6 % (ref 36.0–46.0)
RBC: 4.55 MIL/uL (ref 3.87–5.11)
RDW: 14.3 % (ref 11.5–15.5)
WBC: 10.9 10*3/uL — ABNORMAL HIGH (ref 4.0–10.5)

## 2011-07-02 LAB — BASIC METABOLIC PANEL
BUN: 17 mg/dL (ref 6–23)
CO2: 13 mEq/L — ABNORMAL LOW (ref 19–32)
Chloride: 105 mEq/L (ref 96–112)
GFR calc Af Amer: 55 mL/min — ABNORMAL LOW (ref >60)
Potassium: 3 mEq/L — ABNORMAL LOW (ref 3.5–5.1)

## 2011-07-06 ENCOUNTER — Telehealth (INDEPENDENT_AMBULATORY_CARE_PROVIDER_SITE_OTHER): Payer: Self-pay | Admitting: *Deleted

## 2011-07-06 ENCOUNTER — Emergency Department (HOSPITAL_COMMUNITY)
Admission: EM | Admit: 2011-07-06 | Discharge: 2011-07-06 | Disposition: A | Discharge status: Home / Self Care | Payer: Medicare Other | Attending: Emergency Medicine | Admitting: Emergency Medicine

## 2011-07-06 DIAGNOSIS — R197 Diarrhea, unspecified: Secondary | ICD-10-CM | POA: Insufficient documentation

## 2011-07-06 DIAGNOSIS — E876 Hypokalemia: Secondary | ICD-10-CM | POA: Insufficient documentation

## 2011-07-06 DIAGNOSIS — R109 Unspecified abdominal pain: Secondary | ICD-10-CM | POA: Insufficient documentation

## 2011-07-06 DIAGNOSIS — R110 Nausea: Secondary | ICD-10-CM | POA: Insufficient documentation

## 2011-07-06 LAB — BASIC METABOLIC PANEL
BUN: 10 mg/dL (ref 6–23)
CO2: 14 mEq/L — ABNORMAL LOW (ref 19–32)
Chloride: 105 mEq/L (ref 96–112)
Creatinine, Ser: 1.36 mg/dL — ABNORMAL HIGH (ref 0.50–1.10)

## 2011-07-06 LAB — DIFFERENTIAL
Eosinophils Relative: 1 % (ref 0–5)
Lymphocytes Relative: 24 % (ref 12–46)
Lymphs Abs: 2.8 10*3/uL (ref 0.7–4.0)
Monocytes Relative: 7 % (ref 3–12)
Neutrophils Relative %: 67 % (ref 43–77)

## 2011-07-06 LAB — URINALYSIS, ROUTINE W REFLEX MICROSCOPIC
Glucose, UA: NEGATIVE mg/dL
Hgb urine dipstick: NEGATIVE
Specific Gravity, Urine: 1.018 (ref 1.005–1.030)
Urobilinogen, UA: 0.2 mg/dL (ref 0.0–1.0)
pH: 6.5 (ref 5.0–8.0)

## 2011-07-06 LAB — CBC
HCT: 39 % (ref 36.0–46.0)
MCV: 82.5 fL (ref 78.0–100.0)
RBC: 4.73 MIL/uL (ref 3.87–5.11)
WBC: 11.6 10*3/uL — ABNORMAL HIGH (ref 4.0–10.5)

## 2011-07-06 NOTE — Telephone Encounter (Signed)
Patient was sch'd for TCS 07/08/11 and left message stating she was sick and wanted to cancel her TCS and she would call back to resch at later date

## 2011-07-08 ENCOUNTER — Ambulatory Visit (HOSPITAL_COMMUNITY): Admission: RE | Admit: 2011-07-08 | Payer: Medicare Other | Source: Ambulatory Visit | Admitting: Internal Medicine

## 2011-07-08 ENCOUNTER — Encounter (HOSPITAL_COMMUNITY): Admission: RE | Payer: Self-pay | Source: Ambulatory Visit

## 2011-07-08 ENCOUNTER — Encounter (INDEPENDENT_AMBULATORY_CARE_PROVIDER_SITE_OTHER): Payer: Medicare Other | Admitting: Internal Medicine

## 2011-07-08 SURGERY — COLONOSCOPY
Anesthesia: Moderate Sedation

## 2011-07-22 ENCOUNTER — Encounter (INDEPENDENT_AMBULATORY_CARE_PROVIDER_SITE_OTHER): Payer: Self-pay

## 2011-07-23 ENCOUNTER — Encounter (HOSPITAL_COMMUNITY): Payer: Medicare Other | Attending: Oncology

## 2011-07-23 VITALS — BP 97/61 | HR 62 | Temp 99.0°F | Wt 153.2 lb

## 2011-07-23 DIAGNOSIS — K5090 Crohn's disease, unspecified, without complications: Secondary | ICD-10-CM

## 2011-07-23 MED ORDER — ACETAMINOPHEN 500 MG PO TABS
ORAL_TABLET | ORAL | Status: AC
  Administered 2011-07-23: 1000 mg via ORAL
  Filled 2011-07-23: qty 1

## 2011-07-23 MED ORDER — LORATADINE 10 MG PO TABS
10.0000 mg | ORAL_TABLET | Freq: Every day | ORAL | Status: DC
  Administered 2011-07-23: 10 mg via ORAL

## 2011-07-23 MED ORDER — SODIUM CHLORIDE 0.9 % IV SOLN
Freq: Once | INTRAVENOUS | Status: AC
  Administered 2011-07-23: 50 mL via INTRAVENOUS

## 2011-07-23 MED ORDER — SODIUM CHLORIDE 0.9 % IJ SOLN: 10.0000 mL | Freq: Once | INTRAMUSCULAR | Status: DC

## 2011-07-23 MED ORDER — LORATADINE 10 MG PO TABS
ORAL_TABLET | ORAL | Status: AC
  Administered 2011-07-23: 10 mg via ORAL
  Filled 2011-07-23: qty 1

## 2011-07-23 MED ORDER — SODIUM CHLORIDE 0.9 % IV SOLN
5.0000 mg/kg | Freq: Once | INTRAVENOUS | Status: AC
  Administered 2011-07-23: 300 mg via INTRAVENOUS
  Filled 2011-07-23: qty 30

## 2011-07-23 MED ORDER — ACETAMINOPHEN 325 MG PO TABS
650.0000 mg | ORAL_TABLET | Freq: Four times a day (QID) | ORAL | Status: AC | PRN
  Administered 2011-07-23: 1000 mg via ORAL
  Filled 2011-07-23: qty 2

## 2011-07-23 NOTE — Progress Notes (Signed)
Pt. Tolerated well.

## 2011-07-27 ENCOUNTER — Other Ambulatory Visit (INDEPENDENT_AMBULATORY_CARE_PROVIDER_SITE_OTHER): Payer: Self-pay | Admitting: *Deleted

## 2011-07-27 DIAGNOSIS — K5090 Crohn's disease, unspecified, without complications: Secondary | ICD-10-CM

## 2011-07-27 MED ORDER — HYDROCODONE-ACETAMINOPHEN 7.5-500 MG PO TABS: ORAL_TABLET | ORAL | Status: DC

## 2011-07-27 NOTE — Telephone Encounter (Signed)
Rec'd a fax request for a refill on Hydrocodone/Acetaminoph 7.5-500. Last filled 07-03-2011 . The patient takes this for the pain that she experiences with Crohn's Disease.

## 2011-08-03 ENCOUNTER — Other Ambulatory Visit (INDEPENDENT_AMBULATORY_CARE_PROVIDER_SITE_OTHER): Payer: Self-pay | Admitting: *Deleted

## 2011-08-03 DIAGNOSIS — K5090 Crohn's disease, unspecified, without complications: Secondary | ICD-10-CM

## 2011-08-03 MED ORDER — PREDNISONE 10 MG PO TABS: ORAL_TABLET | ORAL | Status: DC

## 2011-08-27 LAB — CBC
HCT: 36.6
Hemoglobin: 12
MCV: 93.2
RBC: 3.93
WBC: 14.3 — ABNORMAL HIGH

## 2011-08-27 LAB — DIFFERENTIAL
Basophils Absolute: 0
Basophils Relative: 0
Lymphocytes Relative: 4 — ABNORMAL LOW
Neutro Abs: 13.6 — ABNORMAL HIGH

## 2011-08-27 LAB — URINALYSIS, ROUTINE W REFLEX MICROSCOPIC
Bilirubin Urine: NEGATIVE
Nitrite: NEGATIVE
Protein, ur: 30 — AB
Specific Gravity, Urine: 1.031 — ABNORMAL HIGH
Urobilinogen, UA: 0.2

## 2011-08-27 LAB — COMPREHENSIVE METABOLIC PANEL
Alkaline Phosphatase: 115
BUN: 6
CO2: 21
Chloride: 111
Creatinine, Ser: 0.9
GFR calc non Af Amer: >60
Glucose, Bld: 94
Total Bilirubin: 0.4

## 2011-08-27 LAB — URINE MICROSCOPIC-ADD ON

## 2011-08-27 LAB — LIPASE, BLOOD: Lipase: 22

## 2011-08-31 ENCOUNTER — Emergency Department (HOSPITAL_COMMUNITY): Payer: Medicare Other

## 2011-08-31 ENCOUNTER — Emergency Department (HOSPITAL_COMMUNITY)
Admission: EM | Admit: 2011-08-31 | Discharge: 2011-08-31 | Disposition: A | Discharge status: Home / Self Care | Payer: Medicare Other | Attending: Emergency Medicine | Admitting: Emergency Medicine

## 2011-08-31 DIAGNOSIS — M1990 Unspecified osteoarthritis, unspecified site: Secondary | ICD-10-CM | POA: Insufficient documentation

## 2011-08-31 DIAGNOSIS — R51 Headache: Secondary | ICD-10-CM | POA: Insufficient documentation

## 2011-08-31 DIAGNOSIS — M542 Cervicalgia: Secondary | ICD-10-CM | POA: Insufficient documentation

## 2011-08-31 DIAGNOSIS — M47812 Spondylosis without myelopathy or radiculopathy, cervical region: Secondary | ICD-10-CM | POA: Insufficient documentation

## 2011-09-03 ENCOUNTER — Encounter (HOSPITAL_COMMUNITY): Payer: Medicare Other | Attending: Oncology

## 2011-09-03 VITALS — BP 113/72 | HR 92 | Temp 98.4°F | Ht 64.5 in | Wt 154.0 lb

## 2011-09-03 DIAGNOSIS — K5090 Crohn's disease, unspecified, without complications: Secondary | ICD-10-CM | POA: Insufficient documentation

## 2011-09-03 LAB — POCT I-STAT, CHEM 8
Glucose, Bld: 144 — ABNORMAL HIGH
HCT: 41
Hemoglobin: 13.9
Potassium: 2.6 — CL
Sodium: 142
TCO2: 18

## 2011-09-03 LAB — CBC
HCT: 38.4
Hemoglobin: 12.9
MCHC: 32.9
MCV: 89.5
Platelets: 462 — ABNORMAL HIGH
Platelets: 355
RBC: 3.88
WBC: 25.4 — ABNORMAL HIGH

## 2011-09-03 LAB — DIFFERENTIAL
Basophils Relative: 0
Eosinophils Absolute: 0
Eosinophils Relative: 0
Eosinophils Relative: 0
Lymphocytes Relative: 5 — ABNORMAL LOW
Lymphs Abs: 1
Lymphs Abs: 1
Monocytes Absolute: 1
Monocytes Absolute: 0.7
Monocytes Relative: 4
Monocytes Relative: 4
Neutro Abs: 23.4 — ABNORMAL HIGH

## 2011-09-03 LAB — COMPREHENSIVE METABOLIC PANEL
ALT: 12
AST: 13
Albumin: 3.2 — ABNORMAL LOW
CO2: 20
Calcium: 9.3
GFR calc Af Amer: >60
Sodium: 140
Total Protein: 6.9

## 2011-09-03 LAB — URINE MICROSCOPIC-ADD ON

## 2011-09-03 LAB — URINALYSIS, ROUTINE W REFLEX MICROSCOPIC
Bilirubin Urine: NEGATIVE
Glucose, UA: NEGATIVE
Ketones, ur: NEGATIVE
Nitrite: NEGATIVE
Specific Gravity, Urine: 1.022
Specific Gravity, Urine: 1.017
pH: 6
pH: 6

## 2011-09-03 LAB — HEPATIC FUNCTION PANEL
AST: 17
Bilirubin, Direct: <0.1
Total Bilirubin: 0.5

## 2011-09-03 MED ORDER — SODIUM CHLORIDE 0.9 % IV SOLN
INTRAVENOUS | Status: DC
  Administered 2011-09-03: 11:00:00 via INTRAVENOUS

## 2011-09-03 MED ORDER — LORATADINE 10 MG PO TABS
ORAL_TABLET | ORAL | Status: AC
  Administered 2011-09-03: 10 mg via ORAL
  Filled 2011-09-03: qty 1

## 2011-09-03 MED ORDER — ACETAMINOPHEN 325 MG PO TABS
650.0000 mg | ORAL_TABLET | Freq: Once | ORAL | Status: AC
  Administered 2011-09-03: 650 mg via ORAL
  Filled 2011-09-03: qty 2

## 2011-09-03 MED ORDER — INFLIXIMAB 100 MG IV SOLR
5.0000 mg/kg | Freq: Once | INTRAVENOUS | Status: AC
  Administered 2011-09-03: 300 mg via INTRAVENOUS
  Filled 2011-09-03: qty 30

## 2011-09-03 MED ORDER — LORATADINE 10 MG PO TABS
10.0000 mg | ORAL_TABLET | Freq: Every day | ORAL | Status: DC
  Administered 2011-09-03: 10 mg via ORAL

## 2011-09-09 LAB — POCT I-STAT, CHEM 8
Creatinine, Ser: 1.2
Hemoglobin: 12.6
Potassium: 4.1
Sodium: 140
TCO2: 17

## 2011-09-09 LAB — OCCULT BLOOD X 1 CARD TO LAB, STOOL: Fecal Occult Bld: POSITIVE

## 2011-09-09 LAB — DIFFERENTIAL
Basophils Absolute: 0
Eosinophils Absolute: 0.1
Eosinophils Relative: 0
Lymphocytes Relative: 10 — ABNORMAL LOW
Lymphs Abs: 1.4
Neutrophils Relative %: 87 — ABNORMAL HIGH

## 2011-09-09 LAB — CBC
HCT: 36.3
MCV: 88.4
Platelets: 492 — ABNORMAL HIGH
RDW: 14
WBC: 13.9 — ABNORMAL HIGH

## 2011-09-11 LAB — CBC
HCT: 37.9 % (ref 36.0–46.0)
HCT: 33.6 — ABNORMAL LOW
Hemoglobin: 10.7 g/dL — ABNORMAL LOW (ref 12.0–15.0)
Hemoglobin: 14.2 g/dL (ref 12.0–15.0)
MCHC: 31.5 g/dL (ref 30.0–36.0)
MCHC: 33.6 g/dL (ref 30.0–36.0)
MCV: 88 fL (ref 78.0–100.0)
MCV: 88.3 fL (ref 78.0–100.0)
MCV: 89.5 fL (ref 78.0–100.0)
Platelets: 419 10*3/uL — ABNORMAL HIGH (ref 150–400)
Platelets: 379
RBC: 3.62 MIL/uL — ABNORMAL LOW (ref 3.87–5.11)
RBC: 3.65 MIL/uL — ABNORMAL LOW (ref 3.87–5.11)
RBC: 4.92 MIL/uL (ref 3.87–5.11)
RDW: 15.9 — ABNORMAL HIGH
WBC: 15.4 10*3/uL — ABNORMAL HIGH (ref 4.0–10.5)
WBC: 45.3 10*3/uL — ABNORMAL HIGH (ref 4.0–10.5)

## 2011-09-11 LAB — CULTURE, BLOOD (ROUTINE X 2)

## 2011-09-11 LAB — URINALYSIS, ROUTINE W REFLEX MICROSCOPIC
Bilirubin Urine: NEGATIVE
Glucose, UA: NEGATIVE mg/dL
Glucose, UA: NEGATIVE mg/dL
Glucose, UA: NEGATIVE
Hgb urine dipstick: NEGATIVE
Ketones, ur: NEGATIVE mg/dL
Ketones, ur: NEGATIVE mg/dL
Ketones, ur: NEGATIVE mg/dL
Protein, ur: 30 mg/dL — AB
Protein, ur: NEGATIVE mg/dL
Specific Gravity, Urine: 1.027 (ref 1.005–1.030)
Specific Gravity, Urine: 1.027
Urobilinogen, UA: 0.2 mg/dL (ref 0.0–1.0)

## 2011-09-11 LAB — URINE CULTURE: Colony Count: >=100000

## 2011-09-11 LAB — DIFFERENTIAL
Basophils Absolute: 0 10*3/uL (ref 0.0–0.1)
Basophils Absolute: 0
Basophils Relative: 0 % (ref 0–1)
Basophils Relative: 0
Eosinophils Absolute: 0 10*3/uL (ref 0.0–0.7)
Eosinophils Absolute: 0 10*3/uL (ref 0.0–0.7)
Eosinophils Relative: 1
Lymphocytes Relative: 6 % — ABNORMAL LOW (ref 12–46)
Lymphocytes Relative: 12
Lymphs Abs: 0.9 10*3/uL (ref 0.7–4.0)
Lymphs Abs: 1.4 10*3/uL (ref 0.7–4.0)
Monocytes Absolute: 0.1 10*3/uL (ref 0.1–1.0)
Monocytes Absolute: 0.9 10*3/uL (ref 0.1–1.0)
Monocytes Relative: 1 % — ABNORMAL LOW (ref 3–12)
Monocytes Relative: 2 % — ABNORMAL LOW (ref 3–12)
Monocytes Relative: 2 % — ABNORMAL LOW (ref 3–12)
Neutro Abs: 14.2 10*3/uL — ABNORMAL HIGH (ref 1.7–7.7)
Neutro Abs: 43 10*3/uL — ABNORMAL HIGH (ref 1.7–7.7)
Neutrophils Relative %: 92 % — ABNORMAL HIGH (ref 43–77)
Neutrophils Relative %: 94 % — ABNORMAL HIGH (ref 43–77)

## 2011-09-11 LAB — BASIC METABOLIC PANEL
BUN: 6
CO2: 21 mEq/L (ref 19–32)
Calcium: 9.2
Chloride: 110 mEq/L (ref 96–112)
Chloride: 115 mEq/L — ABNORMAL HIGH (ref 96–112)
Creatinine, Ser: 1.08 mg/dL (ref 0.4–1.2)
GFR calc Af Amer: >60 mL/min (ref >60)
GFR calc Af Amer: >60 mL/min (ref >60)
GFR calc non Af Amer: 54 — ABNORMAL LOW
Glucose, Bld: 87
Potassium: 3.5 mEq/L (ref 3.5–5.1)
Sodium: 142 mEq/L (ref 135–145)

## 2011-09-11 LAB — URINE MICROSCOPIC-ADD ON

## 2011-09-11 LAB — COMPREHENSIVE METABOLIC PANEL
ALT: 24 U/L (ref 0–35)
ALT: 32 U/L (ref 0–35)
AST: 40 U/L — ABNORMAL HIGH (ref 0–37)
Alkaline Phosphatase: 127 U/L — ABNORMAL HIGH (ref 39–117)
Alkaline Phosphatase: 89 U/L (ref 39–117)
BUN: 12 mg/dL (ref 6–23)
CO2: 21 mEq/L (ref 19–32)
CO2: 23 mEq/L (ref 19–32)
Chloride: 102 mEq/L (ref 96–112)
GFR calc Af Amer: 51 mL/min — ABNORMAL LOW (ref >60)
GFR calc Af Amer: >60 mL/min (ref >60)
GFR calc non Af Amer: 42 mL/min — ABNORMAL LOW (ref >60)
Glucose, Bld: 174 mg/dL — ABNORMAL HIGH (ref 70–99)
Potassium: 3.7 mEq/L (ref 3.5–5.1)
Sodium: 138 mEq/L (ref 135–145)
Total Bilirubin: 0.8 mg/dL (ref 0.3–1.2)
Total Bilirubin: 0.8 mg/dL (ref 0.3–1.2)
Total Protein: 7.8 g/dL (ref 6.0–8.3)

## 2011-09-11 LAB — LIPASE, BLOOD: Lipase: 303 U/L — ABNORMAL HIGH (ref 11–59)

## 2011-09-14 ENCOUNTER — Encounter (INDEPENDENT_AMBULATORY_CARE_PROVIDER_SITE_OTHER): Payer: Medicare Other | Admitting: Internal Medicine

## 2011-09-14 ENCOUNTER — Encounter (INDEPENDENT_AMBULATORY_CARE_PROVIDER_SITE_OTHER): Payer: Self-pay | Admitting: *Deleted

## 2011-09-15 ENCOUNTER — Telehealth (INDEPENDENT_AMBULATORY_CARE_PROVIDER_SITE_OTHER): Payer: Self-pay | Admitting: *Deleted

## 2011-09-15 LAB — DIFFERENTIAL
Basophils Absolute: 0
Basophils Relative: 0
Eosinophils Absolute: 0.1 — ABNORMAL LOW
Eosinophils Relative: 1
Monocytes Absolute: 0.1
Monocytes Relative: 1 — ABNORMAL LOW

## 2011-09-15 LAB — CBC
HCT: 36.3
Platelets: 526 — ABNORMAL HIGH
WBC: 13.4 — ABNORMAL HIGH

## 2011-09-15 LAB — COMPREHENSIVE METABOLIC PANEL
ALT: 9
AST: 19
Albumin: 3.2 — ABNORMAL LOW
Alkaline Phosphatase: 75
BUN: 4 — ABNORMAL LOW
Chloride: 107
GFR calc Af Amer: >60
Potassium: 3.1 — ABNORMAL LOW
Sodium: 132 — ABNORMAL LOW
Total Bilirubin: 0.9

## 2011-09-15 NOTE — Telephone Encounter (Signed)
Patient called back realized she missed her appt yesterday--RS for 10/30 @ 9:45am.  She needs to have her meds called in  Hydocodone but needs to be for 3 times daily  [facility] [address]. Florence Canner [phone]

## 2011-09-16 NOTE — Telephone Encounter (Signed)
I have given this to Terri to address. I spoke with Jackilyn this morning and she states that she does need a new Rx for her Hydrocodone/APAP 7.5/500 mg  Take 1 by mouth three times a day #90 with 3 refills. I have made Delrae Rend aware and she will address.

## 2011-09-17 LAB — CBC
MCHC: 32.6
Platelets: 419 — ABNORMAL HIGH
RDW: 13.6

## 2011-09-17 LAB — DIFFERENTIAL
Basophils Absolute: 0
Basophils Relative: 0
Eosinophils Relative: 1
Lymphocytes Relative: 18
Neutro Abs: 12.8 — ABNORMAL HIGH

## 2011-09-17 LAB — BASIC METABOLIC PANEL
BUN: 8
CO2: 23
Calcium: 9.9
GFR calc non Af Amer: 46 — ABNORMAL LOW
Glucose, Bld: 93

## 2011-09-17 NOTE — Telephone Encounter (Signed)
Per Delrae Rend verbal instruction the following was [facility]: Hydrocodone/Acetamin 7.5/500 mg - Take 1 by mouth Three times daily-#90 with 2 additional refills. The patient was called and made aware on 09-16-11.

## 2011-09-21 NOTE — Progress Notes (Signed)
This encounter was created in error - please disregard.

## 2011-10-06 ENCOUNTER — Encounter (INDEPENDENT_AMBULATORY_CARE_PROVIDER_SITE_OTHER): Payer: Self-pay | Admitting: Internal Medicine

## 2011-10-06 ENCOUNTER — Ambulatory Visit (INDEPENDENT_AMBULATORY_CARE_PROVIDER_SITE_OTHER): Payer: Medicare Other | Admitting: Internal Medicine

## 2011-10-06 DIAGNOSIS — K5090 Crohn's disease, unspecified, without complications: Secondary | ICD-10-CM

## 2011-10-06 DIAGNOSIS — R197 Diarrhea, unspecified: Secondary | ICD-10-CM

## 2011-10-06 MED ORDER — METRONIDAZOLE 250 MG PO TABS: 250.0000 mg | ORAL_TABLET | Freq: Three times a day (TID) | ORAL | Status: AC

## 2011-10-06 MED ORDER — CHOLESTYRAMINE 4 G PO PACK: 1.0000 | PACK | Freq: Two times a day (BID) | ORAL | Status: DC

## 2011-10-06 NOTE — Patient Instructions (Addendum)
Metronidazole 250 mg by mouth 3 times a day after each meal for 4 weeks. Questran 2-4 g twice daily by mouth 2 hours before or after taking other medications.

## 2011-10-06 NOTE — Progress Notes (Signed)
Presenting complaint; diarrhea and perianal discomfort. Subjective; patient is 65 year old female with history of small bowel and anorectal Crohn's disease who is here for scheduled visit. She was last seen on 06/01/2011. Her last dose of infliximab was on 08/24/2011. She now presents with five-day history of diarrhea and rectal soreness. She states her perianal skin is raw. She denies rectal bleeding, rectal discharge, abdominal pain, nausea vomiting fever or chills. She was on prednisone until one week ago. In his own was given to her by Dr. Loleta Chance for arthCurrent medicationsralgias. She has not taken any antibiotics recently. She has pain in small joints of both hands neck and shoulders. She has not experienced any side effects with infliximab and she states it helps and she wonders if she could get it more frequently. She states it even helps with her joint pains. She is applying Mycolog-II cream to perianal skin but it is not helping.  Current Outpatient Prescriptions on File Prior to Visit  Medication Sig Dispense Refill  . calcium carbonate 1250 MG capsule Take by mouth 2 (two) times daily with a meal.        . dicyclomine (BENTYL) 10 MG/5ML syrup Take 20 mg by mouth 4 (four) times daily -  before meals and at bedtime.        [name] HYDROcodone-acetaminophen (LORTAB 7.5) 7.5-500 MG per tablet Take 1 Tablet By Mouth 3 Times a Day As Needed  60 tablet  0  . inFLIXimab (REMICADE) 100 MG injection Inject into the vein. Last infusion was in September , next infusion 10-24-11      . multivitamin (THERAGRAN) per tablet Take 1 tablet by mouth daily.        [name] nystatin-triamcinolone (MYCOLOG II) cream Apply topically 4 (four) times daily.        . pantoprazole (PROTONIX) 40 MG tablet Take 40 mg by mouth daily.        . Pot Cl in D5W Lact Ringers (KCL 0.3%-LACTATED RINGERS-D5W) 40 MEQ/L SOLN Inject into the vein continuous. Rec's with Remicade Infusion      . Probiotic Product (PROBIOTIC FORMULA PO) Take by  mouth.        . promethazine (PHENERGAN) 25 MG tablet Take 25 mg by mouth every 6 (six) hours as needed.         Objective BP 120/80  Pulse 72  Temp(Src) 98.8 F (37.1 C) (Oral)  Resp 14  Ht 5\' 4"  (1.626 m)  Wt 168 lb (76.204 kg)  BMI 28.84 kg/m2 Conjunctiva is pink. Sclerae nonicteric. Oropharyngeal mucosa is normal. No neck masses or thyromegaly noted. Abdomen is full. Bowel sounds are normal. Abdomen is soft and nontender without organomegaly or masses. Rectal examination was limited to external inspection and she has skin excoriation all-around anal orifice. No peripheral edema or clubbing noted. Assessment #1 Perianal irritation secondary to diarrhea in a patient with known history of small bowel and anorectal Crohn's disease. she does not appear to be acutely ill. #2. 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 Loleta Chance. Plan Proceed with infliximab as scheduled. She is currently on maintenance every eight-weeks. Treatment interval or dose could be changed depending on her response. Metronidazole 250 mg by mouth 3 times a day for 4 weeks. Westra and 2-4 g twice daily take 2 hours before or after taking other medications. Office visit in 6 weeks.

## 2011-10-08 IMAGING — CT CT ABD-PELV W/ CM
1 of 2 series · 15 of 32 positions shown, 19 images · IV contrast (agent unspecified)
Comparison: Multiple priors.  Most recent CT 05/29/2010.

CLINICAL DATA: Crohn's disease.  Severe abdominal pain with nausea
and vomiting.  Status post cholecystectomy, appendectomy, colon
resection, and adhesions with colostomy takedown.

CT ABDOMEN AND PELVIS WITH CONTRAST
Contrast: 100 ml [id number]

[Series 2: rtn ap with st · axial · 0.68mm/px · z∈[+950,+1340]mm · 15 of 86 slices shown, 19 images]
[im 4/86  soft-tissue]
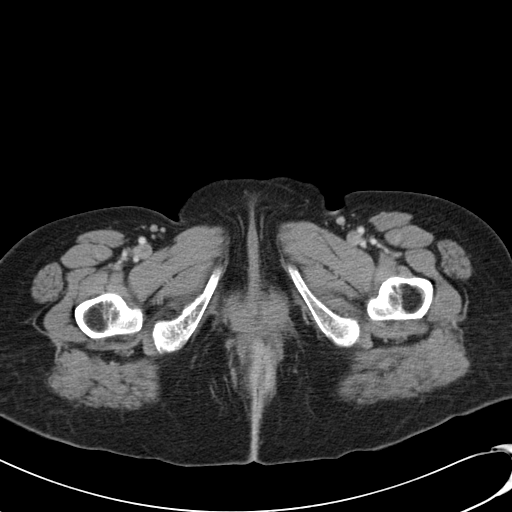
[im 4/86  bone]
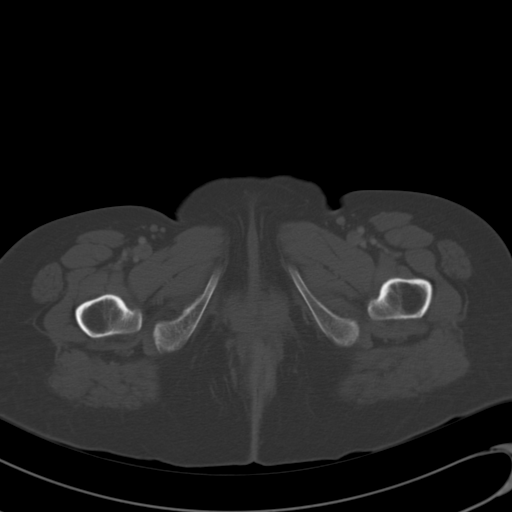
[im 12/86  soft-tissue]
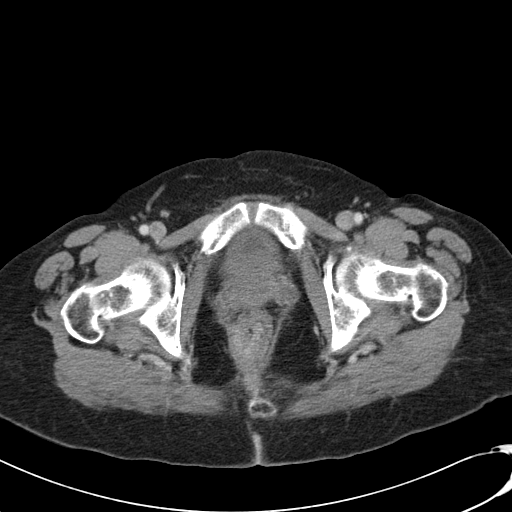
[im 19/86  soft-tissue]
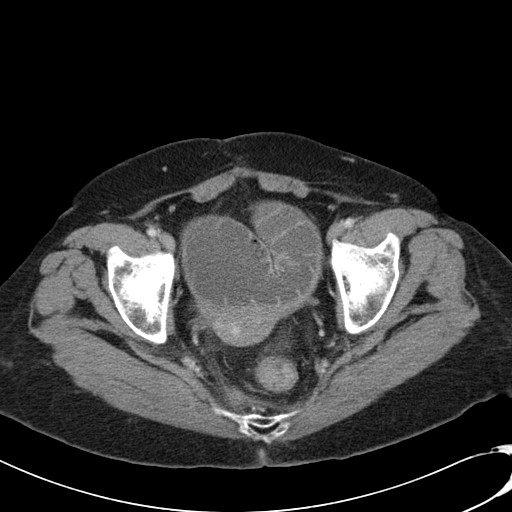
[im 23/86  soft-tissue]
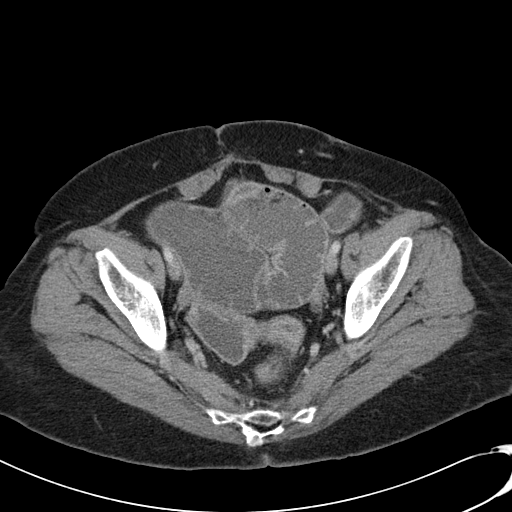
[im 30/86  soft-tissue]
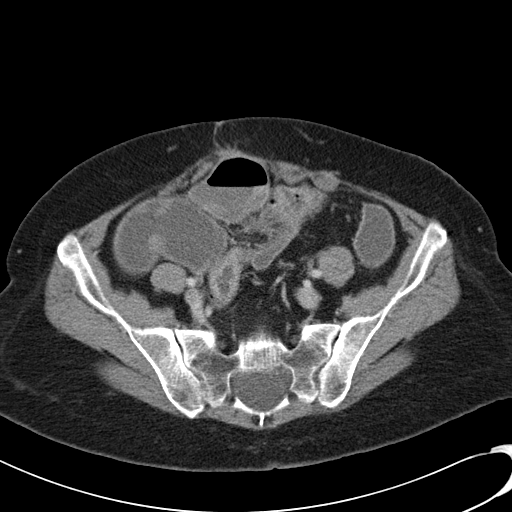
[im 37/86  soft-tissue]
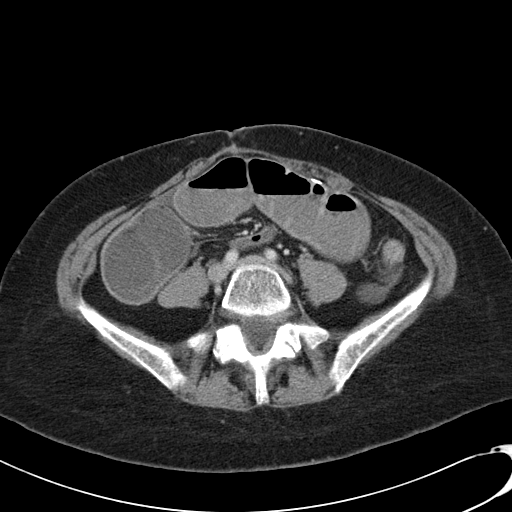
[im 45/86  soft-tissue]
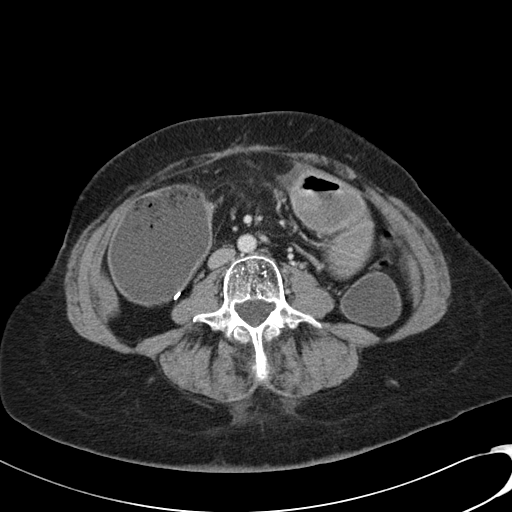
[im 49/86  soft-tissue]
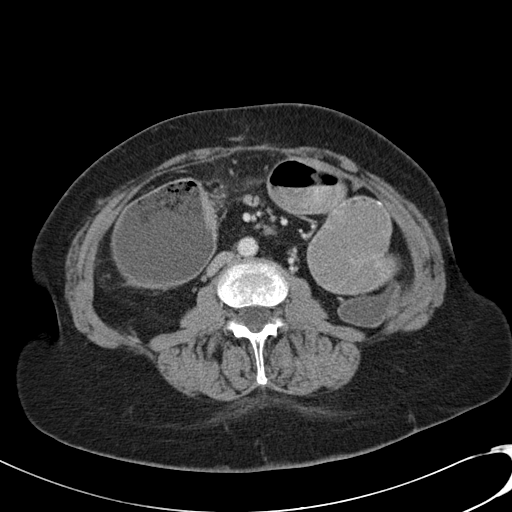
[im 56/86  soft-tissue]
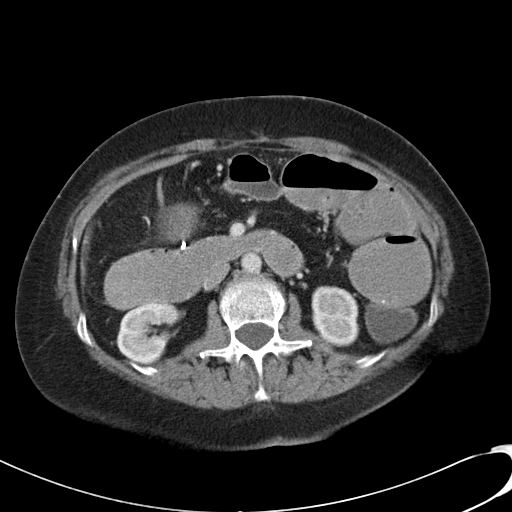
[im 56/86  bone]
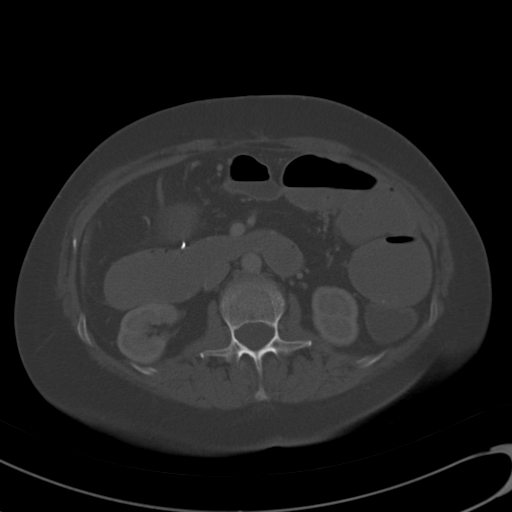
[im 63/86  soft-tissue]
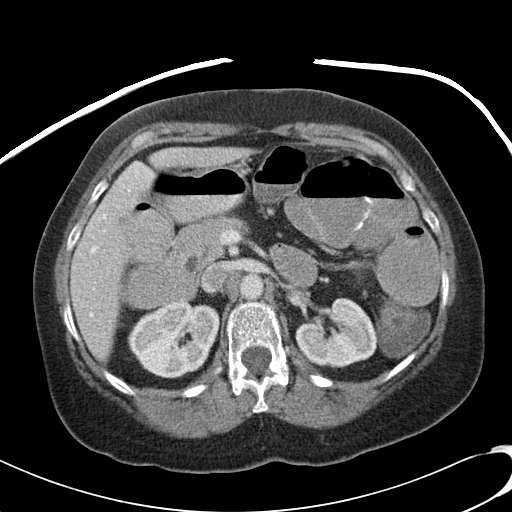
[im 67/86  soft-tissue]
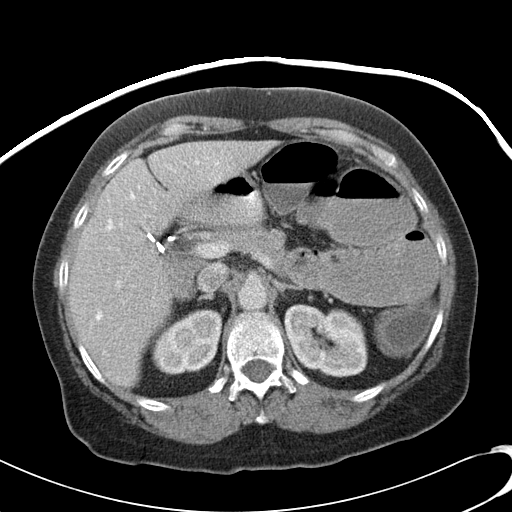
[im 71/86  lung]
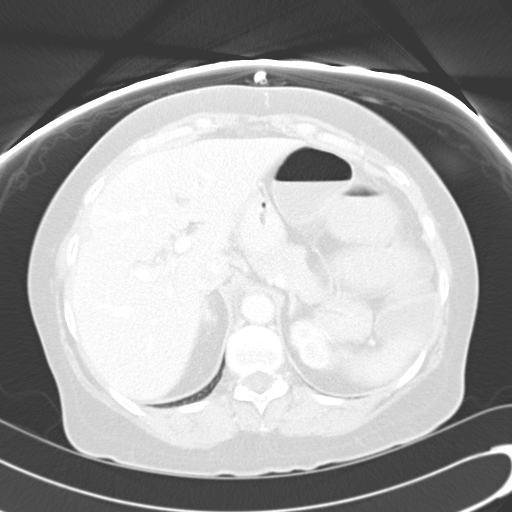
[im 74/86  soft-tissue]
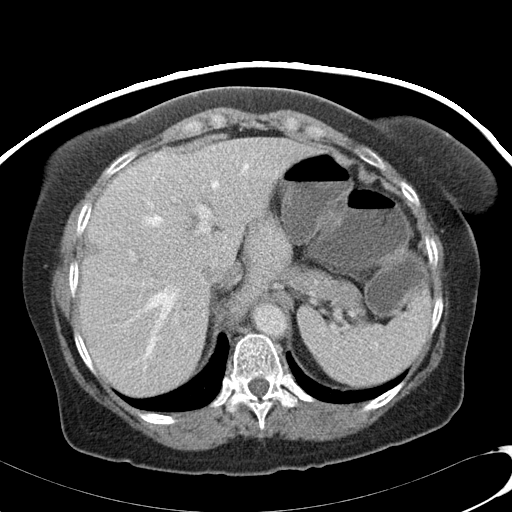
[im 74/86  lung]
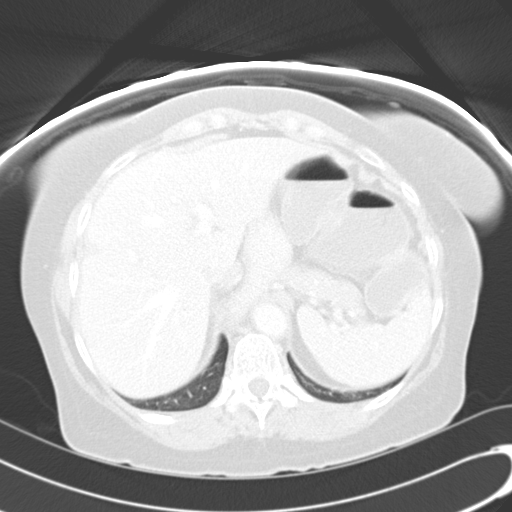
[im 78/86  lung]
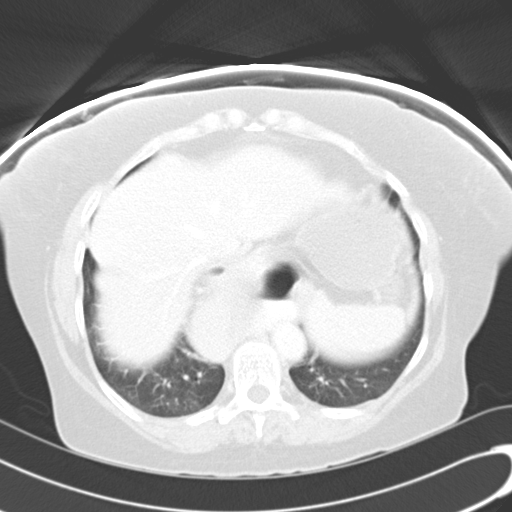
[im 82/86  soft-tissue]
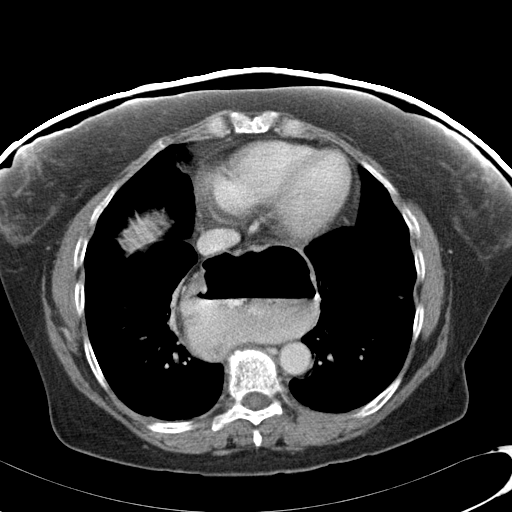
[im 82/86  lung]
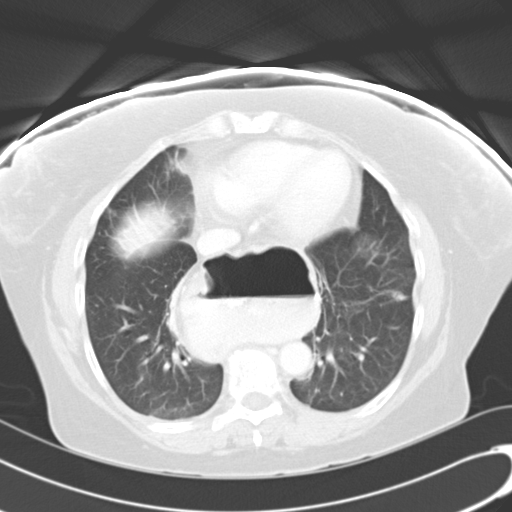

[15 of 32 positions shown; findings below may reference images not displayed]

FINDINGS: Lung bases clear.  Unchanged small hepatic cyst.  Large
hiatal hernia with much of the stomach in the chest.  Normal liver
and spleen.  Gallbladder absent.  Pancreas  and adrenal glands
unremarkable.  Normal renal contour and excretion.  No adenopathy
or vascular abnormality.

The patient is status postresection of a a good deal of small and
large bowel for history of Crohn's disease.  There is marked
dilatation of the remaining intestines.  In the pelvis, the rectum
is of relatively normal caliber.  It is unclear if there is a low
transition zone leading to bowel obstruction due to adhesion, or if
the findings represent ileus.  Correlate clinically.  Continued
surveillance with plain films may be helpful in further evaluation.
No visible intra-abdominal abscess or mass lesion.  No free fluid
or free air.  A similar appearance was noted on the prior CT of
[DATE].  Small area of increased density noted in the uterus likely
fibroid.  Urinary bladder normal.  No visible sacroiliac joint
pathology.
IMPRESSION: Marked bowel distention is similar to that noted on 05/29/2010.
Question ileus versus distal obstruction.  Adhesions would be a
likely consideration if obstruction is present.

## 2011-10-12 ENCOUNTER — Other Ambulatory Visit: Payer: Self-pay

## 2011-10-12 ENCOUNTER — Encounter (HOSPITAL_COMMUNITY): Payer: Self-pay | Admitting: *Deleted

## 2011-10-12 ENCOUNTER — Emergency Department (HOSPITAL_COMMUNITY)
Admission: EM | Admit: 2011-10-12 | Discharge: 2011-10-13 | Disposition: A | Discharge status: Home / Self Care | Payer: Medicare Other | Attending: Emergency Medicine | Admitting: Emergency Medicine

## 2011-10-12 DIAGNOSIS — E872 Acidosis, unspecified: Secondary | ICD-10-CM | POA: Insufficient documentation

## 2011-10-12 DIAGNOSIS — R000 Tachycardia, unspecified: Secondary | ICD-10-CM | POA: Insufficient documentation

## 2011-10-12 DIAGNOSIS — K5090 Crohn's disease, unspecified, without complications: Secondary | ICD-10-CM | POA: Insufficient documentation

## 2011-10-12 DIAGNOSIS — K219 Gastro-esophageal reflux disease without esophagitis: Secondary | ICD-10-CM | POA: Insufficient documentation

## 2011-10-12 DIAGNOSIS — R109 Unspecified abdominal pain: Secondary | ICD-10-CM | POA: Insufficient documentation

## 2011-10-12 DIAGNOSIS — Z87891 Personal history of nicotine dependence: Secondary | ICD-10-CM | POA: Insufficient documentation

## 2011-10-12 DIAGNOSIS — R079 Chest pain, unspecified: Secondary | ICD-10-CM | POA: Insufficient documentation

## 2011-10-12 NOTE — ED Notes (Signed)
Pt in c/o epigastric pain with n/v/d, pt states she has chrones disease and this feels like a flair, also c/o chest pain, EKG completed in triage

## 2011-10-12 NOTE — ED Notes (Signed)
Pt states she believes herself to be having a chrone's flair.  Pt has a hx of such.  Pt has been having diarrhea x2 days during which she has been passing much flatus and vomiting.

## 2011-10-13 ENCOUNTER — Telehealth (INDEPENDENT_AMBULATORY_CARE_PROVIDER_SITE_OTHER): Payer: Self-pay | Admitting: Internal Medicine

## 2011-10-13 ENCOUNTER — Other Ambulatory Visit: Payer: Self-pay

## 2011-10-13 ENCOUNTER — Emergency Department (HOSPITAL_COMMUNITY): Payer: Medicare Other

## 2011-10-13 LAB — DIFFERENTIAL
Basophils Absolute: 0 10*3/uL (ref 0.0–0.1)
Basophils Relative: 0 % (ref 0–1)
Eosinophils Absolute: 0.1 10*3/uL (ref 0.0–0.7)
Eosinophils Relative: 0 % (ref 0–5)
Neutrophils Relative %: 76 % (ref 43–77)

## 2011-10-13 LAB — CBC
MCH: 26.8 pg (ref 26.0–34.0)
MCHC: 32.1 g/dL (ref 30.0–36.0)
Platelets: 450 10*3/uL — ABNORMAL HIGH (ref 150–400)
RBC: 4.73 MIL/uL (ref 3.87–5.11)
RDW: 14.2 % (ref 11.5–15.5)

## 2011-10-13 LAB — URINE MICROSCOPIC-ADD ON

## 2011-10-13 LAB — COMPREHENSIVE METABOLIC PANEL
ALT: 19 U/L (ref 0–35)
Albumin: 3.2 g/dL — ABNORMAL LOW (ref 3.5–5.2)
Alkaline Phosphatase: 127 U/L — ABNORMAL HIGH (ref 39–117)
Calcium: 8.7 mg/dL (ref 8.4–10.5)
Potassium: 3 mEq/L — ABNORMAL LOW (ref 3.5–5.1)
Sodium: 134 mEq/L — ABNORMAL LOW (ref 135–145)
Total Protein: 8.1 g/dL (ref 6.0–8.3)

## 2011-10-13 LAB — URINALYSIS, ROUTINE W REFLEX MICROSCOPIC
Bilirubin Urine: NEGATIVE
Glucose, UA: NEGATIVE mg/dL
Ketones, ur: NEGATIVE mg/dL
Nitrite: NEGATIVE
Specific Gravity, Urine: 1.021 (ref 1.005–1.030)
pH: 6.5 (ref 5.0–8.0)

## 2011-10-13 MED ORDER — HYDROMORPHONE HCL PF 1 MG/ML IJ SOLN
1.0000 mg | Freq: Once | INTRAMUSCULAR | Status: AC
  Administered 2011-10-13: 1 mg via INTRAVENOUS
  Filled 2011-10-13: qty 1

## 2011-10-13 MED ORDER — SODIUM CHLORIDE 0.9 % IV BOLUS (SEPSIS)
1000.0000 mL | Freq: Once | INTRAVENOUS | Status: AC
  Administered 2011-10-13: 1000 mL via INTRAVENOUS

## 2011-10-13 MED ORDER — PREDNISONE 5 MG PO TABS: ORAL_TABLET | ORAL | Status: AC

## 2011-10-13 MED ORDER — ONDANSETRON HCL 4 MG/2ML IJ SOLN
4.0000 mg | Freq: Once | INTRAMUSCULAR | Status: AC
  Administered 2011-10-13: 4 mg via INTRAVENOUS

## 2011-10-13 MED ORDER — ONDANSETRON HCL 4 MG/2ML IJ SOLN
INTRAMUSCULAR | Status: AC
  Administered 2011-10-13: 4 mg via INTRAVENOUS
  Filled 2011-10-13: qty 2

## 2011-10-13 MED ORDER — PROMETHAZINE HCL 25 MG/ML IJ SOLN
25.0000 mg | Freq: Once | INTRAMUSCULAR | Status: AC
  Administered 2011-10-13: 25 mg via INTRAVENOUS
  Filled 2011-10-13: qty 1

## 2011-10-13 NOTE — Telephone Encounter (Signed)
Talked with Danielle [name] she was seen in the ER in [place] last night. She was given IV's for dehydration. [name] states that she had contacted that virus and cold not get back to where she was, feels that she is having a flare and request prednisone 20 mg -Take by mouth 1 week , 10 mg by mouth 1 week , called to [facility] on [address] in [place], [place]  I told her that I would give to Delrae Rend to address and to check with Pharmacy later in the day to make sure that they have it.

## 2011-10-13 NOTE — Telephone Encounter (Signed)
Patient has called back and states that she needs her pain medication refilled and she has refills but, it is 2 days early. While at the ER last night she states that she was not given any meds for pain. The pharmacist states if we would call and change her RX to Hydrocodone 7.5/500 mg take 1 by mouth 4 times daily, they would go ahead an fill it. Danielle [name] ask that we please help her due to her pain. The Pharmacy number is [phone] , she also ask that we call her @ [phone] to let her know the outcome.

## 2011-10-13 NOTE — ED Provider Notes (Signed)
History     CSN: [id number] Arrival date & time: 10/12/2011  8:15 PM   First MD Initiated Contact with Patient 10/13/11 0308      Chief Complaint  Patient presents with  . Chest Pain    (Consider location/radiation/quality/duration/timing/severity/associated sxs/prior treatment) Patient is a 65 y.o. female presenting with chest pain. The history is provided by the patient.  Chest Pain    patient here with 2 days of abdominal pain similar to her Crohn's disease. Patient has had emesis x5 over the last 3 days, which has been nonbilious or bloody. No black or bloody stools noted. Location of pain is diffuse  described as crampy, no fever noted. Patient has used medications at home which have not helped.  Past Medical History  Diagnosis Date  . Crohn's disease   . Chronic diarrhea   . Joint pain   . GERD (gastroesophageal reflux disease)   . Polyarthralgia     Past Surgical History  Procedure Date  . Bowel resection   . Colostomy     [name]  . Anal stenosis   . Colonoscopy 2000  . Cholecystectomy   . Appendectomy   . Goiter 1999    Family History  Problem Relation Age of Onset  . Diabetes Mother   . Healthy Sister   . Hypertension Brother   . Colon cancer Brother     History  Substance Use Topics  . Smoking status: Former Smoker    Types: Cigarettes    Quit date: 10/05/1996  . Smokeless tobacco: Never Used   Comment: 1 pack every two weeks when she smoked  . Alcohol Use: No    OB History    Grav Para Term Preterm Abortions TAB SAB Ect Mult Living                  Review of Systems  Cardiovascular: Positive for chest pain.  All other systems reviewed and are negative.    Allergies  Aspirin; Morphine and related; and Sulfur  Home Medications   Current Outpatient Rx  Name Route Sig Dispense Refill  . CALCIUM CARBONATE 1250 MG PO CAPS Oral Take by mouth 2 (two) times daily with a meal.      . CALCIUM-VITAMIN D 600-200 MG-UNIT PO TABS Oral Take 1  tablet by mouth daily.      . CHOLESTYRAMINE 4 G PO PACK Oral Take 1 packet by mouth 2 (two) times daily. 60 each 1  . VITAMIN B-12 IJ Injection Inject as directed every 30 (thirty) days.      [name] DICYCLOMINE HCL 10 MG/5ML PO SOLN Oral Take 20 mg by mouth 4 (four) times daily -  before meals and at bedtime.      [name] HYDROCODONE-ACETAMINOPHEN 7.5-500 MG PO TABS  Take 1 Tablet By Mouth 3 Times a Day As Needed 60 tablet 0  . INFLIXIMAB 100 MG IV SOLR Intravenous Inject into the vein. Last infusion was in September , next infusion 10-24-11    . METRONIDAZOLE 250 MG PO TABS Oral Take 1 tablet (250 mg total) by mouth 3 (three) times daily. 84 tablet 0  . MULTIVITAMINS PO TABS Oral Take 1 tablet by mouth daily.      . NYSTATIN-TRIAMCINOLONE 100000-0.1 UNIT/GM-% EX CREA Topical Apply topically 4 (four) times daily.      [name] PANTOPRAZOLE SODIUM 40 MG PO TBEC Oral Take 40 mg by mouth daily.      [name] KCL IN D5W LACTATED RINGERS 40 MEQ/L IV SOLN  Intravenous Inject into the vein continuous. Rec's with Remicade Infusion    . PROBIOTIC FORMULA PO Oral Take by mouth.      [name] PROMETHAZINE HCL 25 MG PO TABS Oral Take 25 mg by mouth every 6 (six) hours as needed.      [name] ZOLPIDEM TARTRATE 10 MG PO TABS Oral Take 10 mg by mouth as needed.       BP 113/71  Pulse 99  Temp(Src) 99.6 F (37.6 C) (Oral)  Resp 18  SpO2 98%  Physical Exam  Nursing note and vitals reviewed. Constitutional: She is oriented to person, place, and time. She appears well-developed and well-nourished.  Non-toxic appearance. No distress.  HENT:  Head: Normocephalic and atraumatic.  Eyes: Conjunctivae and EOM are normal. Pupils are equal, round, and reactive to light.  Neck: Normal range of motion. Neck supple. No tracheal deviation present.  Cardiovascular: Regular rhythm and normal heart sounds.  Tachycardia present.  Exam reveals no gallop.   No murmur heard. Pulmonary/Chest: Effort normal and breath sounds normal. No stridor. No respiratory  distress. She has no wheezes.  Abdominal: Soft. Normal appearance and bowel sounds are normal. She exhibits no distension. There is generalized tenderness. There is no rigidity, no rebound and no guarding.  Musculoskeletal: Normal range of motion. She exhibits no edema and no tenderness.  Neurological: She is alert and oriented to person, place, and time. She has normal strength. No cranial nerve deficit or sensory deficit. GCS eye subscore is 4. GCS verbal subscore is 5. GCS motor subscore is 6.  Skin: Skin is warm and dry.  Psychiatric: She has a normal mood and affect. Her speech is normal and behavior is normal.    ED Course  Procedures (including critical care time)   Labs Reviewed  CBC  DIFFERENTIAL  COMPREHENSIVE METABOLIC PANEL  URINALYSIS, ROUTINE W REFLEX MICROSCOPIC  LIPASE, BLOOD   No results found.   No diagnosis found.    MDM    Date: 10/13/2011  Rate: 93  Rhythm: normal sinus rhythm  QRS Axis: normal  Intervals: normal  ST/T Wave abnormalities: nonspecific ST changes  Conduction Disutrbances:none  Narrative Interpretation:   Old EKG Reviewed: none available       5:51 AM Is given IV fluids 2 L total. Also given pain medications and antibiotics. Patient's acidosis noted on her electrolytes and she was offered admission. She does not want to stay in the hospital at this time and wants to see her doctor at 10 AM for a scheduled visit. Patient is stable at this time for discharge  Toy Baker, MD 10/13/11 [phone]

## 2011-10-13 NOTE — Telephone Encounter (Signed)
Danielle Ar, NP states that she has called patient and made her aware that she will not fill her pain medication.

## 2011-10-13 NOTE — ED Notes (Signed)
Pt and family complaining about length of time they are having to wait to see a Dr.  Dr Freida Busman aware and at bedside at the moment.

## 2011-10-13 NOTE — Telephone Encounter (Signed)
Would like for [name] to give her a call back.

## 2011-10-13 NOTE — ED Notes (Signed)
The pt is sleeping with no complaints at this time.

## 2011-10-13 NOTE — Telephone Encounter (Signed)
Danielle [name] thinks she is having a Crohn's flare. She is having  Loose stool. She is presently taking Imodium for her diarrhea.  I will start her on a Prednisone taper.  I am not going to refill her hydrocodone.

## 2011-10-15 ENCOUNTER — Ambulatory Visit (HOSPITAL_COMMUNITY): Payer: Medicare Other

## 2011-10-21 ENCOUNTER — Encounter (HOSPITAL_COMMUNITY): Payer: Medicare Other | Attending: Oncology

## 2011-10-21 VITALS — Ht 64.0 in | Wt 154.0 lb

## 2011-10-21 DIAGNOSIS — K5090 Crohn's disease, unspecified, without complications: Secondary | ICD-10-CM | POA: Insufficient documentation

## 2011-10-21 MED ORDER — SODIUM CHLORIDE 0.9 % IJ SOLN
10.0000 mL | INTRAMUSCULAR | Status: DC | PRN
  Administered 2011-10-21: 10 mL via INTRAVENOUS

## 2011-10-21 MED ORDER — LORATADINE 10 MG PO TABS
10.0000 mg | ORAL_TABLET | Freq: Every day | ORAL | Status: DC
  Administered 2011-10-21: 10 mg via ORAL
  Filled 2011-10-21: qty 1

## 2011-10-21 MED ORDER — ACETAMINOPHEN 325 MG PO TABS
650.0000 mg | ORAL_TABLET | Freq: Once | ORAL | Status: AC
  Administered 2011-10-21: 650 mg via ORAL
  Filled 2011-10-21: qty 2

## 2011-10-21 MED ORDER — SODIUM CHLORIDE 0.9 % IV SOLN
INTRAVENOUS | Status: DC
  Administered 2011-10-21: 13:00:00 via INTRAVENOUS

## 2011-10-21 MED ORDER — SODIUM CHLORIDE 0.9 % IV SOLN
5.0000 mg/kg | Freq: Once | INTRAVENOUS | Status: AC
  Administered 2011-10-21: 300 mg via INTRAVENOUS
  Filled 2011-10-21: qty 30

## 2011-11-06 ENCOUNTER — Encounter (HOSPITAL_COMMUNITY): Payer: Self-pay | Admitting: Emergency Medicine

## 2011-11-06 ENCOUNTER — Emergency Department (HOSPITAL_COMMUNITY): Payer: Medicare Other

## 2011-11-06 ENCOUNTER — Inpatient Hospital Stay (HOSPITAL_COMMUNITY)
Admission: EM | Admit: 2011-11-06 | Discharge: 2011-11-13 | DRG: 641 | Disposition: A | Discharge status: Home / Self Care | Payer: Medicare Other | Attending: Internal Medicine | Admitting: Internal Medicine

## 2011-11-06 DIAGNOSIS — R197 Diarrhea, unspecified: Secondary | ICD-10-CM

## 2011-11-06 DIAGNOSIS — Z79899 Other long term (current) drug therapy: Secondary | ICD-10-CM

## 2011-11-06 DIAGNOSIS — D473 Essential (hemorrhagic) thrombocythemia: Secondary | ICD-10-CM | POA: Diagnosis present

## 2011-11-06 DIAGNOSIS — K5090 Crohn's disease, unspecified, without complications: Secondary | ICD-10-CM | POA: Diagnosis present

## 2011-11-06 DIAGNOSIS — R112 Nausea with vomiting, unspecified: Secondary | ICD-10-CM

## 2011-11-06 DIAGNOSIS — I959 Hypotension, unspecified: Secondary | ICD-10-CM | POA: Diagnosis present

## 2011-11-06 DIAGNOSIS — K449 Diaphragmatic hernia without obstruction or gangrene: Secondary | ICD-10-CM | POA: Diagnosis present

## 2011-11-06 DIAGNOSIS — E876 Hypokalemia: Principal | ICD-10-CM | POA: Diagnosis present

## 2011-11-06 DIAGNOSIS — A088 Other specified intestinal infections: Secondary | ICD-10-CM | POA: Diagnosis present

## 2011-11-06 DIAGNOSIS — D72829 Elevated white blood cell count, unspecified: Secondary | ICD-10-CM | POA: Diagnosis present

## 2011-11-06 DIAGNOSIS — K219 Gastro-esophageal reflux disease without esophagitis: Secondary | ICD-10-CM | POA: Diagnosis present

## 2011-11-06 DIAGNOSIS — R1310 Dysphagia, unspecified: Secondary | ICD-10-CM

## 2011-11-06 DIAGNOSIS — Z2989 Encounter for other specified prophylactic measures: Secondary | ICD-10-CM

## 2011-11-06 DIAGNOSIS — Z418 Encounter for other procedures for purposes other than remedying health state: Secondary | ICD-10-CM

## 2011-11-06 LAB — CBC
HCT: 36.3 % (ref 36.0–46.0)
Hemoglobin: 12.4 g/dL (ref 12.0–15.0)
MCH: 27.7 pg (ref 26.0–34.0)
MCHC: 34.2 g/dL (ref 30.0–36.0)
MCV: 81 fL (ref 78.0–100.0)
RBC: 4.48 MIL/uL (ref 3.87–5.11)

## 2011-11-06 LAB — MAGNESIUM: Magnesium: 2 mg/dL (ref 1.5–2.5)

## 2011-11-06 LAB — CARDIAC PANEL(CRET KIN+CKTOT+MB+TROPI): CK, MB: 4.7 ng/mL — ABNORMAL HIGH (ref 0.3–4.0)

## 2011-11-06 LAB — BASIC METABOLIC PANEL
BUN: 11 mg/dL (ref 6–23)
CO2: 14 mEq/L — ABNORMAL LOW (ref 19–32)
Calcium: 8.9 mg/dL (ref 8.4–10.5)
Creatinine, Ser: 1.29 mg/dL — ABNORMAL HIGH (ref 0.50–1.10)
GFR calc non Af Amer: 43 mL/min — ABNORMAL LOW (ref >90)
Glucose, Bld: 77 mg/dL (ref 70–99)

## 2011-11-06 MED ORDER — ENOXAPARIN SODIUM 40 MG/0.4ML ~~LOC~~ SOLN
40.0000 mg | SUBCUTANEOUS | Status: DC
  Administered 2011-11-06 – 2011-11-12 (×7): 40 mg via SUBCUTANEOUS
  Filled 2011-11-06 (×8): qty 0.4

## 2011-11-06 MED ORDER — MAGNESIUM SULFATE 40 MG/ML IJ SOLN
2.0000 g | Freq: Once | INTRAMUSCULAR | Status: AC
  Administered 2011-11-06: 2 g via INTRAVENOUS
  Filled 2011-11-06: qty 50

## 2011-11-06 MED ORDER — MULTIVITAMINS PO TABS: 1.0000 | ORAL_TABLET | Freq: Every day | ORAL | Status: DC

## 2011-11-06 MED ORDER — HYDROMORPHONE HCL PF 1 MG/ML IJ SOLN
0.5000 mg | INTRAMUSCULAR | Status: DC | PRN
  Administered 2011-11-06 – 2011-11-09 (×9): 1 mg via INTRAVENOUS
  Administered 2011-11-09: 0.5 mg via INTRAVENOUS
  Administered 2011-11-10 – 2011-11-13 (×13): 1 mg via INTRAVENOUS
  Filled 2011-11-06 (×24): qty 1

## 2011-11-06 MED ORDER — DICYCLOMINE HCL 10 MG/5ML PO SOLN
20.0000 mg | Freq: Three times a day (TID) | ORAL | Status: DC
  Administered 2011-11-07 – 2011-11-10 (×8): 20 mg via ORAL
  Filled 2011-11-06 (×18): qty 10

## 2011-11-06 MED ORDER — THERA M PLUS PO TABS
1.0000 | ORAL_TABLET | Freq: Every day | ORAL | Status: DC
  Administered 2011-11-07 – 2011-11-13 (×7): 1 via ORAL
  Filled 2011-11-06 (×7): qty 1

## 2011-11-06 MED ORDER — HYDROMORPHONE HCL PF 1 MG/ML IJ SOLN
1.0000 mg | Freq: Once | INTRAMUSCULAR | Status: AC
  Administered 2011-11-06: 1 mg via INTRAVENOUS
  Filled 2011-11-06: qty 1

## 2011-11-06 MED ORDER — PROMETHAZINE HCL 25 MG/ML IJ SOLN
12.5000 mg | Freq: Four times a day (QID) | INTRAMUSCULAR | Status: DC | PRN
  Administered 2011-11-06 – 2011-11-11 (×16): 12.5 mg via INTRAVENOUS
  Administered 2011-11-12: 23:00:00 via INTRAVENOUS
  Administered 2011-11-12 (×2): 12.5 mg via INTRAVENOUS
  Filled 2011-11-06 (×19): qty 1

## 2011-11-06 MED ORDER — CALCIUM CARBONATE-VITAMIN D 500-200 MG-UNIT PO TABS
1.0000 | ORAL_TABLET | Freq: Every day | ORAL | Status: DC
  Administered 2011-11-07 – 2011-11-13 (×7): 1 via ORAL
  Filled 2011-11-06 (×7): qty 1

## 2011-11-06 MED ORDER — ACETAMINOPHEN 650 MG RE SUPP: 650.0000 mg | Freq: Four times a day (QID) | RECTAL | Status: DC | PRN

## 2011-11-06 MED ORDER — ALUM & MAG HYDROXIDE-SIMETH 200-200-20 MG/5ML PO SUSP: 30.0000 mL | Freq: Four times a day (QID) | ORAL | Status: DC | PRN

## 2011-11-06 MED ORDER — ACETAMINOPHEN 325 MG PO TABS: 650.0000 mg | ORAL_TABLET | Freq: Four times a day (QID) | ORAL | Status: DC | PRN

## 2011-11-06 MED ORDER — OXYCODONE HCL 5 MG PO TABS
5.0000 mg | ORAL_TABLET | ORAL | Status: DC | PRN
  Filled 2011-11-06: qty 1

## 2011-11-06 MED ORDER — MAGNESIUM SULFATE 40 MG/ML IJ SOLN: 2.0000 g | Freq: Once | INTRAMUSCULAR | Status: DC

## 2011-11-06 MED ORDER — FLORA-Q PO CAPS
1.0000 | ORAL_CAPSULE | Freq: Every day | ORAL | Status: DC
  Administered 2011-11-07 – 2011-11-13 (×8): 1 via ORAL
  Filled 2011-11-06 (×8): qty 1

## 2011-11-06 MED ORDER — CHOLESTYRAMINE 4 G PO PACK
1.0000 | PACK | ORAL | Status: DC
  Administered 2011-11-07 (×2): 1 via ORAL
  Filled 2011-11-06 (×15): qty 1

## 2011-11-06 MED ORDER — ZOLPIDEM TARTRATE 5 MG PO TABS
5.0000 mg | ORAL_TABLET | Freq: Every evening | ORAL | Status: DC | PRN
  Administered 2011-11-07 – 2011-11-11 (×3): 5 mg via ORAL
  Filled 2011-11-06 (×3): qty 1

## 2011-11-06 MED ORDER — POTASSIUM CHLORIDE IN NACL 40-0.9 MEQ/L-% IV SOLN
INTRAVENOUS | Status: DC
  Administered 2011-11-06 – 2011-11-10 (×7): via INTRAVENOUS
  Administered 2011-11-12: 100 mL/h via INTRAVENOUS
  Administered 2011-11-13: 06:00:00 via INTRAVENOUS
  Filled 2011-11-06 (×20): qty 1000

## 2011-11-06 MED ORDER — PROMETHAZINE HCL 12.5 MG PO TABS
12.5000 mg | ORAL_TABLET | Freq: Four times a day (QID) | ORAL | Status: DC | PRN
  Filled 2011-11-06 (×4): qty 1

## 2011-11-06 MED ORDER — PROBIOTIC FORMULA PO CAPS: 1.0000 | ORAL_CAPSULE | ORAL | Status: DC

## 2011-11-06 MED ORDER — POTASSIUM CHLORIDE 10 MEQ/100ML IV SOLN
10.0000 meq | INTRAVENOUS | Status: AC
  Administered 2011-11-06 – 2011-11-07 (×5): 10 meq via INTRAVENOUS
  Filled 2011-11-06 (×5): qty 100

## 2011-11-06 MED ORDER — PROMETHAZINE HCL 25 MG/ML IJ SOLN
25.0000 mg | Freq: Once | INTRAMUSCULAR | Status: AC
  Administered 2011-11-06: 25 mg via INTRAVENOUS
  Filled 2011-11-06: qty 1

## 2011-11-06 MED ORDER — CALCIUM-VITAMIN D 600-200 MG-UNIT PO TABS: 1.0000 | ORAL_TABLET | Freq: Every day | ORAL | Status: DC

## 2011-11-06 MED ORDER — POTASSIUM CHLORIDE 10 MEQ/100ML IV SOLN
10.0000 meq | Freq: Once | INTRAVENOUS | Status: AC
  Administered 2011-11-06: 10 meq via INTRAVENOUS
  Filled 2011-11-06: qty 100

## 2011-11-06 MED ORDER — SODIUM CHLORIDE 0.9 % IV BOLUS (SEPSIS)
1000.0000 mL | Freq: Once | INTRAVENOUS | Status: AC
  Administered 2011-11-06: 1000 mL via INTRAVENOUS

## 2011-11-06 NOTE — ED Notes (Signed)
The pt up to the br just after her pain and nausea med was given.  She insisted on going to the br cautioned against it.  Family member and staff accompanied her there.  She tolerated  It well.

## 2011-11-06 NOTE — ED Notes (Signed)
Pt c/o mid sternal CP with radiation into throat and productive cough with clear sputum; pt sts SOB and nausea

## 2011-11-06 NOTE — ED Notes (Signed)
Patient is alert and oriented x4, resp even unlabored. Skin w/d. C/o abdominal pain, history of chrohns disease. Waiting on hospitalist for admission

## 2011-11-06 NOTE — ED Provider Notes (Signed)
History     CSN: [id number] Arrival date & time: 11/06/2011  3:09 PM   First MD Initiated Contact with Patient 11/06/11 1528      Chief Complaint  Patient presents with  . Chest Pain    (Consider location/radiation/quality/duration/timing/severity/associated sxs/prior treatment) Patient is a 65 y.o. female presenting with chest pain and GI illlness. The history is provided by the patient.  Chest Pain The chest pain began 2 days ago. Chest pain occurs constantly. The chest pain is unchanged. Associated with: swallowing. At its most intense, the pain is at 7/10. The pain is currently at 3/10. Quality: "like something is stuck" The pain does not radiate. Exacerbated by: swallowing. Primary symptoms include cough, nausea and vomiting. Pertinent negatives for primary symptoms include no fever, no syncope, no shortness of breath, no palpitations and no abdominal pain.  The cough began 2 days ago. The cough is productive. The sputum is clear.  Pertinent negatives for associated symptoms include no diaphoresis, no lower extremity edema, no near-syncope, no orthopnea, no paroxysmal nocturnal dyspnea and no weakness. Treatments tried: anti emetics. Risk factors include post-menopausal and being elderly. Past medical history comments: crohn's disease    GI Problem  This is a new problem. The current episode started 2 days ago. The problem occurs 5 to 10 times per day. The problem has not changed since onset.There has been no fever. Associated symptoms include vomiting and cough. Pertinent negatives include no abdominal pain, no chills and no headaches. Past medical history comments: crohn's disease.    Past Medical History  Diagnosis Date  . Crohn's disease   . Chronic diarrhea   . Joint pain   . GERD (gastroesophageal reflux disease)   . Polyarthralgia     Past Surgical History  Procedure Date  . Bowel resection   . Colostomy     [name]  . Anal stenosis   . Colonoscopy 2000  .  Cholecystectomy   . Appendectomy   . Goiter 1999    Family History  Problem Relation Age of Onset  . Diabetes Mother   . Healthy Sister   . Hypertension Brother   . Colon cancer Brother     History  Substance Use Topics  . Smoking status: Former Smoker    Types: Cigarettes    Quit date: 10/05/1996  . Smokeless tobacco: Never Used   Comment: 1 pack every two weeks when she smoked  . Alcohol Use: No    OB History    Grav Para Term Preterm Abortions TAB SAB Ect Mult Living                  Review of Systems  Constitutional: Negative for fever, chills and diaphoresis.  HENT: Positive for sore throat and trouble swallowing. Negative for voice change.   Respiratory: Positive for cough. Negative for shortness of breath.   Cardiovascular: Positive for chest pain. Negative for palpitations, orthopnea, syncope and near-syncope.  Gastrointestinal: Positive for nausea and vomiting. Negative for abdominal pain.  Musculoskeletal: Negative for back pain.  Skin: Negative for color change and rash.  Neurological: Negative for weakness, light-headedness and headaches.  All other systems reviewed and are negative.    Allergies  Aspirin; Morphine and related; and Sulfa antibiotics  Home Medications   Current Outpatient Rx  Name Route Sig Dispense Refill  . CALCIUM-VITAMIN D 600-200 MG-UNIT PO TABS Oral Take 1 tablet by mouth daily.      . CEPHALEXIN 500 MG PO CAPS Oral Take 500 mg  by mouth 3 (three) times daily. Started on 11/29 for 7 days     . CHOLESTYRAMINE 4 G PO PACK Oral Take 1 packet by mouth 2 (two) times daily. 60 each 1  . VITAMIN B-12 IJ Injection Inject as directed every 30 (thirty) days.      [name] DICYCLOMINE HCL 10 MG/5ML PO SOLN Oral Take 20 mg by mouth 4 (four) times daily -  before meals and at bedtime.     [name] HYDROCODONE-ACETAMINOPHEN 7.5-500 MG PO TABS  Take 1 Tablet By Mouth 3 Times a Day As Needed 60 tablet 0  . MULTIVITAMINS PO TABS Oral Take 1 tablet by mouth  daily.      . NYSTATIN-TRIAMCINOLONE 100000-0.1 UNIT/GM-% EX CREA Topical Apply topically 4 (four) times daily.     [name] PANTOPRAZOLE SODIUM 40 MG PO TBEC Oral Take 40 mg by mouth daily.      [name] PROBIOTIC FORMULA PO Oral Take 1 tablet by mouth daily.     [name] PROMETHAZINE HCL 25 MG PO TABS Oral Take 25 mg by mouth every 6 (six) hours as needed. Nausea    . INFLIXIMAB 100 MG IV SOLR Intravenous Inject 100 mg into the vein every 6 (six) weeks. Last infusion was in October , next infusion 11-26-11    . ZOLPIDEM TARTRATE 10 MG PO TABS Oral Take 10 mg by mouth as needed. insomnia      BP 115/60  Pulse 105  Temp(Src) 98.5 F (36.9 C) (Oral)  Resp 18  SpO2 97%  Physical Exam  Nursing note and vitals reviewed. Constitutional: She is oriented to person, place, and time. She appears well-developed and well-nourished.  HENT:  Head: Normocephalic and atraumatic.  Eyes: Pupils are equal, round, and reactive to light.  Cardiovascular: Normal rate, regular rhythm, normal heart sounds and intact distal pulses.   Pulmonary/Chest: Effort normal and breath sounds normal. No respiratory distress.  Abdominal: Soft. She exhibits no distension. There is no tenderness.  Neurological: She is alert and oriented to person, place, and time.  Skin: Skin is warm and dry.  Psychiatric: She has a normal mood and affect.    ED Course  Procedures (including critical care time)  Labs Reviewed  CBC - Abnormal; Notable for the following:    WBC 17.8 (*)    Platelets 450 (*)    All other components within normal limits  BASIC METABOLIC PANEL - Abnormal; Notable for the following:    Sodium 133 (*)    Potassium 2.0 (*)    CO2 14 (*)    Creatinine, Ser 1.29 (*)    GFR calc non Af Amer 43 (*)    GFR calc Af Amer 50 (*)    All other components within normal limits  CARDIAC PANEL(CRET KIN+CKTOT+MB+TROPI) - Abnormal; Notable for the following:    CK, MB 4.7 (*)    Relative Index 4.3 (*)    All other components within  normal limits   Dg Chest 2 View  11/06/2011  *RADIOLOGY REPORT*  Clinical Data: Cough with increasing sputum.  CHEST - 2 VIEW  Comparison: Chest radiograph 06/05/2010 and 05/28/2010  Findings: Cardiopericardial silhouette is upper normal in size and stable.  Stable thoracic aorta and hilar contours are normal pulmonary vascularity.  Moderate to large hiatal hernia appears similar to prior examination.  The lungs are normally expanded and clear.  There is no pleural effusion. Bones appear osteopenic.  No acute bony abnormality identified.  Mild gaseous distention of visualized colon in  the left upper quadrant, similar to prior exam.  IMPRESSION: Moderate to large hiatal hernia.  No acute cardiopulmonary disease.  Original Report Authenticated By: Britta Mccreedy, M.D.     Date: 11/06/2011  Rate: 108  Rhythm: sinus tachycardia  QRS Axis: right  Intervals: normal  ST/T Wave abnormalities: indeterminate Abnormal appearance of ST segment in leads I, aVL< but no ST depression  Conduction Disutrbances:none  Narrative Interpretation:   Old EKG Reviewed: unchanged    1. Hypokalemia   2. Crohn's disease   3. Trouble swallowing   4. Nausea & vomiting   5. Diarrhea       MDM  64 yo F with hx Crohn's disease presents with 2 days of throat and chest pain, mild ache chronically, but worse with swallowing; says it feels like something is getting stuck when swallowing. Will have to swallow own saliva several times to get it to go down all the way. Says started with a piece of chicken feeling like it got stuck, but has not had problems since then being able to eat and swallow food. Is accompanied by cough productive of clear sputum, and nausea with several episodes of vomiting. Denies fevers, SOB, abd pain, changes in bowel habits. EKG obtained in triage as above. Will check basic labs, CXR, and due to age and complaint of CP, will check trop. However, do not think is likely to be cardiac in etiology; is  much more likely to be related to esophageal issues secondary to Crohn's.  Pt significantly hypokalemic; will start treatment. Has leukocytosis to 17.8, which is higher than previous, and at end of steroid taper. However, afebrile, no signs of pna on CXR. Discussed pt with medicine, who will admit to step down unit for monitoring and further treatment.     Theotis Burrow, MD 11/07/11 [phone]

## 2011-11-06 NOTE — ED Notes (Signed)
The pt has had difficulty swallowing dry mouth with vomiting  For 3 days.  She has a history of crohns disaese.  She gets iv remicade and she received that approx 3 weeks ago.  Alert oriented skin warm and dry.

## 2011-11-06 NOTE — ED Notes (Signed)
The pts pot chloride has almost infused she is not having any pain or irriitation form the meds

## 2011-11-06 NOTE — H&P (Addendum)
DATE OF ADMISSION:  11/06/2011  PCP:    [name], MD   Chief Complaint: Weakness  HPI: Danielle [name] is an 65 y.o. female with a history of Crohn's Disease since 1990, who presents with complaints of weakness difficulty swallowing, and worsening diarrhea X 1 week.  She reports having difficulty swallowing but denies pain in her throat.  She had been given antibiotic therapy of Keflex for a dental abscess and had dental work done.  She reports also having increased heartburn and indigestion and decreased appetite.   She has had nausea but denies vomiting.  She denies any hematochezia or melena passage.   In the Emergency department, her laboratory studies revealed a potassium level of 2.0. Replacement therapy was started and she was referred for admission.    Past Medical History  Diagnosis Date  . Crohn's disease   . Chronic diarrhea   . Joint pain   . GERD (gastroesophageal reflux disease)   . Polyarthralgia     Past Surgical History  Procedure Date  . Bowel resection   . Colostomy     [name]  . Anal stenosis   . Colonoscopy 2000  . Cholecystectomy   . Appendectomy   . Goiter 1999    Medications:  HOME MEDS: Prior to Admission medications   Medication Sig Start Date End Date Taking? Authorizing Provider  Calcium-Vitamin D 600-200 MG-UNIT per tablet Take 1 tablet by mouth daily.     Yes Historical Provider, MD  cephALEXin (KEFLEX) 500 MG capsule Take 500 mg by mouth 3 (three) times daily. Started on 11/29 for 7 days    Yes Historical Provider, MD  cholestyramine Lanetta Inch) 4 G packet Take 1 packet by mouth 2 (two) times daily. 10/06/11 10/05/12 Yes Malissa Hippo, MD  Cyanocobalamin (VITAMIN B-12 IJ) Inject as directed every 30 (thirty) days.     Yes Historical Provider, MD  dicyclomine (BENTYL) 10 MG/5ML syrup Take 20 mg by mouth 4 (four) times daily -  before meals and at bedtime.    Yes Historical Provider, MD  HYDROcodone-acetaminophen (LORTAB 7.5) 7.5-500 MG  per tablet Take 1 Tablet By Mouth 3 Times a Day As Needed 07/27/11  Yes Llana Aliment, NP  multivitamin [facility]) per tablet Take 1 tablet by mouth daily.     Yes Historical Provider, MD  nystatin-triamcinolone (MYCOLOG II) cream Apply topically 4 (four) times daily.    Yes Historical Provider, MD  pantoprazole (PROTONIX) 40 MG tablet Take 40 mg by mouth daily.     Yes Historical Provider, MD  Probiotic Product (PROBIOTIC FORMULA PO) Take 1 tablet by mouth daily.    Yes Historical Provider, MD  promethazine (PHENERGAN) 25 MG tablet Take 25 mg by mouth every 6 (six) hours as needed. Nausea   Yes Historical Provider, MD  inFLIXimab (REMICADE) 100 MG injection Inject 100 mg into the vein every 6 (six) weeks. Last infusion was in October , next infusion 11-26-11    Historical Provider, MD  zolpidem (AMBIEN) 10 MG tablet Take 10 mg by mouth as needed. insomnia 08/24/11   Historical Provider, MD    Allergies:  Allergies  Allergen Reactions  . Aspirin Other (See Comments)    Clamps her stomach  . Morphine And Related Itching  . Sulfa Antibiotics Nausea And Vomiting    Social History:   reports that she quit smoking about 15 years ago. Her smoking use included Cigarettes. She has never used smokeless tobacco. She reports that she does not drink  alcohol or use illicit drugs.  Family History: Family History  Problem Relation Age of Onset  . Diabetes Mother   . Healthy Sister   . Hypertension Brother   . Colon cancer Brother     Review of Systems:  The patient denies anorexia, fever, weight loss, vision loss, decreased hearing, chest pain, syncope, dyspnea on exertion, peripheral edema, balance deficits, hemoptysis, abdominal pain, melena, hematochezia,  hematuria, incontinence, genital sores, muscle weakness, suspicious skin lesions, transient blindness, difficulty walking, depression, unusual weight change, abnormal bleeding, enlarged lymph nodes, angioedema, and breast masses.  Physical  Exam:  GEN:  Pleasant 65 year old well nourished well developed appearing African American Female in no acute distress; cooperative with exam Filed Vitals:   11/06/11 1512 11/06/11 1804 11/06/11 1945  BP: 115/60 100/62 105/66  Pulse: 105 93 89  Temp: 98.5 F (36.9 C) 98.4 F (36.9 C)   TempSrc: Oral Axillary   Resp: 18 18   SpO2: 97% 100% 100%   Blood pressure 105/66, pulse 89, temperature 98.4 F (36.9 C), temperature source Axillary, resp. rate 18, SpO2 100.00%. PSYCH: SHe is alert and oriented x4; does not appear anxious does not appear depressed; affect is normal HEENT: Normocephalic and Atraumatic, Mucous membranes pink; PERRLA; EOM intact; Fundi:  Benign;  No scleral icterus, Nares: Patent, Oropharynx: Clear, Sparse Dentition, Neck:  FROM, no cervical lymphadenopathy nor thyromegaly or carotid bruit; no JVD; Breasts:: Not examined CHEST WALL: No tenderness CHEST: Normal respiration, clear to auscultation bilaterally HEART: Regular rate and rhythm; no murmurs rubs or gallops BACK: No kyphosis or scoliosis; no CVA tenderness ABDOMEN: Positive Bowel Sounds, soft non-tender; no masses, no organomegaly, no pannus; no intertriginous candida. Rectal Exam: Not done EXTREMITIES: No bone or joint deformity; age-appropriate arthropathy of the hands and knees; no cyanosis, clubbing or edema; no ulcerations. Genitalia: not examined PULSES: 2+ and symmetric SKIN: No rash or ulceration CNS: Cranial nerves 2-12 grossly intact no focal neurologic deficit   Labs & Imaging Results for orders placed during the hospital encounter of 11/06/11 (from the past 48 hour(s))  CBC     Status: Abnormal   Collection Time   11/06/11  4:02 PM      Component Value Range Comment   WBC 17.8 (*) 4.0 - 10.5 (K/uL)    RBC 4.48  3.87 - 5.11 (MIL/uL)    Hemoglobin 12.4  12.0 - 15.0 (g/dL)    HCT 45.4  09.8 - 11.9 (%)    MCV 81.0  78.0 - 100.0 (fL)    MCH 27.7  26.0 - 34.0 (pg)    MCHC 34.2  30.0 - 36.0  (g/dL)    RDW 14.7  82.9 - 56.2 (%)    Platelets 450 (*) 150 - 400 (K/uL)   BASIC METABOLIC PANEL     Status: Abnormal   Collection Time   11/06/11  4:02 PM      Component Value Range Comment   Sodium 133 (*) 135 - 145 (mEq/L)    Potassium 2.0 (*) 3.5 - 5.1 (mEq/L)    Chloride 107  96 - 112 (mEq/L)    CO2 14 (*) 19 - 32 (mEq/L)    Glucose, Bld 77  70 - 99 (mg/dL)    BUN 11  6 - 23 (mg/dL)    Creatinine, Ser 1.30 (*) 0.50 - 1.10 (mg/dL)    Calcium 8.9  8.4 - 10.5 (mg/dL)    GFR calc non Af Amer 43 (*) >90 (mL/min)    GFR  calc Af Amer 50 (*) >90 (mL/min)   CARDIAC PANEL(CRET KIN+CKTOT+MB+TROPI)     Status: Abnormal   Collection Time   11/06/11  4:04 PM      Component Value Range Comment   Total CK 109  7 - 177 (U/L)    CK, MB 4.7 (*) 0.3 - 4.0 (ng/mL)    Troponin I <0.30  <0.30 (ng/mL)    Relative Index 4.3 (*) 0.0 - 2.5     Dg Chest 2 View  11/06/2011  *RADIOLOGY REPORT*  Clinical Data: Cough with increasing sputum.  CHEST - 2 VIEW  Comparison: Chest radiograph 06/05/2010 and 05/28/2010  Findings: Cardiopericardial silhouette is upper normal in size and stable.  Stable thoracic aorta and hilar contours are normal pulmonary vascularity.  Moderate to large hiatal hernia appears similar to prior examination.  The lungs are normally expanded and clear.  There is no pleural effusion. Bones appear osteopenic.  No acute bony abnormality identified.  Mild gaseous distention of visualized colon in the left upper quadrant, similar to prior exam.  IMPRESSION: Moderate to large hiatal hernia.  No acute cardiopulmonary disease.  Original Report Authenticated By: Britta Mccreedy, M.D.      Assessment/Plan: Present on Admission:  .Hypokalemia Diarrhea (Chronic) Dysphagia Leukocytosis Thrombocytosis Crohn's Disease   Plan:    Patient has been admitted to a Stepdown Bed due to her severe Hypokalemia ( potassium level =2.0) from her chronic diarrhea.  Potassium replacement therapy has been  ordered and a magnesium level will be checked.  She will be placed in isolation temporarily due to her diarrhea to rule out an infectious cause, she does have a leukocytosis and thrombocytosis which may be early indices of bacteremia or sepsis, therefore Stool studies will be sent for C.diff PCR, and Stool Pathogens.  An evaluation for dysphagia will also be ordered, however this may also may be due to an infectious process, possibly candidal or herpetic, if so a GI consultation may be needed once her potassium has been replaced.  Her regular medications will be reconciled, and DVT prophylaxis has been ordered.      Other plans as per orders.    CODE STATUS:      FULL CODE       Farley Crooker C 11/06/2011, 8:02 PM

## 2011-11-06 NOTE — ED Notes (Signed)
Pot drip started 10 meq to run in over one hour.  nss running along with it to dilute

## 2011-11-06 NOTE — ED Notes (Signed)
The pts pot chlorde iv has infused. Her liter bolus has almost infused

## 2011-11-07 LAB — BASIC METABOLIC PANEL
CO2: 15 mEq/L — ABNORMAL LOW (ref 19–32)
Calcium: 8.1 mg/dL — ABNORMAL LOW (ref 8.4–10.5)
Creatinine, Ser: 1.02 mg/dL (ref 0.50–1.10)
GFR calc non Af Amer: 57 mL/min — ABNORMAL LOW (ref >90)
Sodium: 139 mEq/L (ref 135–145)

## 2011-11-07 LAB — CBC
MCH: 26.8 pg (ref 26.0–34.0)
MCV: 81.8 fL (ref 78.0–100.0)
Platelets: 430 10*3/uL — ABNORMAL HIGH (ref 150–400)
RBC: 4.06 MIL/uL (ref 3.87–5.11)
RDW: 15.1 % (ref 11.5–15.5)
WBC: 12.2 10*3/uL — ABNORMAL HIGH (ref 4.0–10.5)

## 2011-11-07 LAB — CLOSTRIDIUM DIFFICILE BY PCR: Toxigenic C. Difficile by PCR: NEGATIVE

## 2011-11-07 MED ORDER — POTASSIUM CHLORIDE 10 MEQ/100ML IV SOLN
10.0000 meq | INTRAVENOUS | Status: AC
  Administered 2011-11-07 – 2011-11-08 (×6): 10 meq via INTRAVENOUS
  Filled 2011-11-07 (×6): qty 100

## 2011-11-07 MED ORDER — LOPERAMIDE HCL 2 MG PO CAPS
2.0000 mg | ORAL_CAPSULE | ORAL | Status: DC | PRN
  Administered 2011-11-07 – 2011-11-13 (×10): 2 mg via ORAL
  Filled 2011-11-07 (×10): qty 1

## 2011-11-07 MED ORDER — POTASSIUM CHLORIDE CRYS ER 20 MEQ PO TBCR
40.0000 meq | EXTENDED_RELEASE_TABLET | Freq: Two times a day (BID) | ORAL | Status: AC
  Administered 2011-11-07 (×2): 40 meq via ORAL
  Filled 2011-11-07 (×2): qty 2

## 2011-11-07 MED ORDER — PANTOPRAZOLE SODIUM 40 MG IV SOLR
40.0000 mg | Freq: Two times a day (BID) | INTRAVENOUS | Status: DC
  Administered 2011-11-07 – 2011-11-08 (×3): 40 mg via INTRAVENOUS
  Filled 2011-11-07 (×4): qty 40

## 2011-11-07 NOTE — Progress Notes (Signed)
Subjective:  Persistent diarrhea, no abd pain. Objective: Vital signs in last 24 hours: Filed Vitals:   11/06/11 2115 11/06/11 2223 11/07/11 0434 11/07/11 1413  BP: 102/56 84/55 98/61  94/61  Pulse: 85 78 84 88  Temp:  97.7 F (36.5 C) 97.9 F (36.6 C) 98.6 F (37 C)  TempSrc:  Oral  Oral  Resp: 18 18 18 18   Height:  5\' 4"  (1.626 m)    Weight:  71.714 kg (158 lb 1.6 oz) 71.124 kg (156 lb 12.8 oz)   SpO2: 100% 98% 100% 100%   Weight change:   Intake/Output Summary (Last 24 hours) at 11/07/11 1837 Last data filed at 11/07/11 1820  Gross per 24 hour  Intake 3826.67 ml  Output    805 ml  Net 3021.67 ml   Physical Exam: .on exam she is alert and afebrile and comfortable. cvs s1 and s2 heard  Lungs are clear abd is soft and bowels sounds hyperactive Ext no  Edema. Neuro: non focal  Lab Results: Results for orders placed during the hospital encounter of 11/06/11 (from the past 24 hour(s))  CLOSTRIDIUM DIFFICILE BY PCR     Status: Normal   Collection Time   11/07/11  1:00 AM      Component Value Range   C difficile by pcr NEGATIVE  NEGATIVE   BASIC METABOLIC PANEL     Status: Abnormal   Collection Time   11/07/11 11:00 AM      Component Value Range   Sodium 139  135 - 145 (mEq/L)   Potassium 2.2 (*) 3.5 - 5.1 (mEq/L)   Chloride 113 (*) 96 - 112 (mEq/L)   CO2 15 (*) 19 - 32 (mEq/L)   Glucose, Bld 66 (*) 70 - 99 (mg/dL)   BUN 6  6 - 23 (mg/dL)   Creatinine, Ser 8.11  0.50 - 1.10 (mg/dL)   Calcium 8.1 (*) 8.4 - 10.5 (mg/dL)   GFR calc non Af Amer 57 (*) >90 (mL/min)   GFR calc Af Amer 66 (*) >90 (mL/min)  CBC     Status: Abnormal   Collection Time   11/07/11 11:00 AM      Component Value Range   WBC 12.2 (*) 4.0 - 10.5 (K/uL)   RBC 4.06  3.87 - 5.11 (MIL/uL)   Hemoglobin 10.9 (*) 12.0 - 15.0 (g/dL)   HCT 91.4 (*) 78.2 - 46.0 (%)   MCV 81.8  78.0 - 100.0 (fL)   MCH 26.8  26.0 - 34.0 (pg)   MCHC 32.8  30.0 - 36.0 (g/dL)   RDW 95.6  21.3 - 08.6 (%)   Platelets 430  (*) 150 - 400 (K/uL)         Micro Results: Recent Results (from the past 240 hour(s))  CLOSTRIDIUM DIFFICILE BY PCR     Status: Normal   Collection Time   11/07/11  1:00 AM      Component Value Range Status Comment   C difficile by pcr NEGATIVE  NEGATIVE  Final    Studies/Results: Dg Chest 2 View  11/06/2011  *RADIOLOGY REPORT*  Clinical Data: Cough with increasing sputum.  CHEST - 2 VIEW  Comparison: Chest radiograph 06/05/2010 and 05/28/2010  Findings: Cardiopericardial silhouette is upper normal in size and stable.  Stable thoracic aorta and hilar contours are normal pulmonary vascularity.  Moderate to large hiatal hernia appears similar to prior examination.  The lungs are normally expanded and clear.  There is no pleural effusion. Bones appear osteopenic.  No acute bony abnormality identified.  Mild gaseous distention of visualized colon in the left upper quadrant, similar to prior exam.  IMPRESSION: Moderate to large hiatal hernia.  No acute cardiopulmonary disease.  Original Report Authenticated By: Britta Mccreedy, M.D.   Medications: reviewed Scheduled Meds:   . calcium-vitamin D  1 tablet Oral Daily  . cholestyramine  1 packet Oral Custom  . dicyclomine  20 mg Oral TID AC & HS  . enoxaparin  40 mg Subcutaneous Q24H  . Flora-Q  1 capsule Oral Daily  . magnesium sulfate IVPB  2 g Intravenous Once  . multivitamins ther. w/minerals  1 tablet Oral Daily  . pantoprazole (PROTONIX) IV  40 mg Intravenous Q12H  . potassium chloride  10 mEq Intravenous Once  . potassium chloride  10 mEq Intravenous Q1 Hr x 6  . potassium chloride  10 mEq Intravenous Q1 Hr x 6  . potassium chloride  40 mEq Oral BID  . DISCONTD: Calcium-Vitamin D  1 tablet Oral Daily  . DISCONTD: magnesium sulfate IVPB  2 g Intravenous Once  . DISCONTD: multivitamin  1 tablet Oral Daily  . DISCONTD: PROBIOTIC FORMULA  1 capsule Oral 1 day or 1 dose   Continuous Infusions:   . 0.9 % NaCl with KCl 40 mEq / L 100  mL/hr at 11/07/11 1002   PRN Meds:.acetaminophen, acetaminophen, alum & mag hydroxide-simeth, HYDROmorphone, loperamide, oxyCODONE, promethazine, promethazine, zolpidem Assessment/Plan: Principal Problem:  *Hypokalemia is being repleted. Pt denies any chest pain. Will get a repeat potassium level tonight.  And replete as needed Active Problems:  Chronic diarrhea: since 1 week. Probably crohns flare up.  Takes monthly subcutaneous injections for crohns disease. c diff pcr negative and other stool work up still pending. On PRN imodium, gi consulted. Spoke to Dr Marina Goodell over the phone, recommended no indication for steroids yet. Watchful observation, and replete k as needed.   Dysphagia speech and swallow eval pending.  Leukocytosis: improving. Not on any antibiotics yet. Stool work pending.   Thrombocytosis: reactive   LOS: 1 day   Caoilainn Sacks 11/07/2011, 6:37 PM

## 2011-11-07 NOTE — ED Provider Notes (Signed)
I saw and evaluated the patient, reviewed the resident's note and I agree with the findings and plan.  The patient arrived complaining of chest pain, difficulty swallowing.  The heart and lung exam were within normal limits as was the remainder of the physical exam.  The ekg was unchanged from prior studies and I agree with the resident's interpretation of it.  Will discuss admission with medicine as there is an elevated wbc of undetermined significance.  Also, the K is low at 2.  Will initiate replacement and consult medicine.  Geoffery Lyons, MD 11/07/11 [phone]

## 2011-11-07 NOTE — Significant Event (Signed)
11/07/11 (12:27) K+ at 2.2 Dr. Blake Divine caaled and informed orders given.

## 2011-11-08 LAB — CBC
HCT: 30 % — ABNORMAL LOW (ref 36.0–46.0)
Hemoglobin: 9.9 g/dL — ABNORMAL LOW (ref 12.0–15.0)
MCH: 27.1 pg (ref 26.0–34.0)
MCHC: 33 g/dL (ref 30.0–36.0)
RBC: 3.65 MIL/uL — ABNORMAL LOW (ref 3.87–5.11)

## 2011-11-08 LAB — BASIC METABOLIC PANEL
BUN: 5 mg/dL — ABNORMAL LOW (ref 6–23)
CO2: 13 mEq/L — ABNORMAL LOW (ref 19–32)
Calcium: 8.3 mg/dL — ABNORMAL LOW (ref 8.4–10.5)
GFR calc non Af Amer: 52 mL/min — ABNORMAL LOW (ref >90)
Glucose, Bld: 76 mg/dL (ref 70–99)

## 2011-11-08 MED ORDER — PANTOPRAZOLE SODIUM 40 MG PO TBEC
40.0000 mg | DELAYED_RELEASE_TABLET | Freq: Every day | ORAL | Status: DC
  Administered 2011-11-08 – 2011-11-13 (×5): 40 mg via ORAL
  Filled 2011-11-08 (×5): qty 1

## 2011-11-08 NOTE — Progress Notes (Signed)
11/08/11:  Pharmacy- IV to po 64yo female with Crohn's flare, experiencing continued diarrhea but no abdominal pain.  She is taking other medications by mouth.  Will change Protonix IV to po per P&T cmte protocol  Renaee Munda 11/08/2011

## 2011-11-08 NOTE — Progress Notes (Addendum)
Subjective: Patient improved comfortable diarrhea has improved denies any nausea vomiting or abdominal pain. Still has discomfort in her throat on swallowing. Speech and swallow evaluation still pending.  Objective: Weight change: 0.499 kg (1 lb 1.6 oz)  Intake/Output Summary (Last 24 hours) at 11/08/11 1717 Last data filed at 11/08/11 1300  Gross per 24 hour  Intake    960 ml  Output      4 ml  Net    956 ml   on exam she is alert and afebrile and comfortable.  cvs s1 and s2 heard  Lungs are clear  abd is soft and bowels sounds hyperactive  Ext no Edema.  Neuro: non focal   Lab Results: Results for orders placed during the hospital encounter of 11/06/11 (from the past 24 hour(s))  CBC     Status: Abnormal   Collection Time   11/08/11  6:40 AM      Component Value Range   WBC 12.8 (*) 4.0 - 10.5 (K/uL)   RBC 3.65 (*) 3.87 - 5.11 (MIL/uL)   Hemoglobin 9.9 (*) 12.0 - 15.0 (g/dL)   HCT 16.1 (*) 09.6 - 46.0 (%)   MCV 82.2  78.0 - 100.0 (fL)   MCH 27.1  26.0 - 34.0 (pg)   MCHC 33.0  30.0 - 36.0 (g/dL)   RDW 04.5  40.9 - 81.1 (%)   Platelets 392  150 - 400 (K/uL)  BASIC METABOLIC PANEL     Status: Abnormal   Collection Time   11/08/11  6:40 AM      Component Value Range   Sodium 138  135 - 145 (mEq/L)   Potassium 3.4 (*) 3.5 - 5.1 (mEq/L)   Chloride 119 (*) 96 - 112 (mEq/L)   CO2 13 (*) 19 - 32 (mEq/L)   Glucose, Bld 76  70 - 99 (mg/dL)   BUN 5 (*) 6 - 23 (mg/dL)   Creatinine, Ser 9.14 (*) 0.50 - 1.10 (mg/dL)   Calcium 8.3 (*) 8.4 - 10.5 (mg/dL)   GFR calc non Af Amer 52 (*) >90 (mL/min)   GFR calc Af Amer 60 (*) >90 (mL/min)     Micro Results: Recent Results (from the past 240 hour(s))  CLOSTRIDIUM DIFFICILE BY PCR     Status: Normal   Collection Time   11/07/11  1:00 AM      Component Value Range Status Comment   C difficile by pcr NEGATIVE  NEGATIVE  Final     Studies/Results: Dg Chest 2 View  11/06/2011  *RADIOLOGY REPORT*  Clinical Data: Cough with  increasing sputum.  CHEST - 2 VIEW  Comparison: Chest radiograph 06/05/2010 and 05/28/2010  Findings: Cardiopericardial silhouette is upper normal in size and stable.  Stable thoracic aorta and hilar contours are normal pulmonary vascularity.  Moderate to large hiatal hernia appears similar to prior examination.  The lungs are normally expanded and clear.  There is no pleural effusion. Bones appear osteopenic.  No acute bony abnormality identified.  Mild gaseous distention of visualized colon in the left upper quadrant, similar to prior exam.  IMPRESSION: Moderate to large hiatal hernia.  No acute cardiopulmonary disease.  Original Report Authenticated By: Britta Mccreedy, M.D.   Dg Abd Acute W/chest  10/13/2011  *RADIOLOGY REPORT*  Clinical Data: Abdominal pain.  ACUTE ABDOMEN SERIES (ABDOMEN 2 VIEW & CHEST 1 VIEW)  Comparison: 04/02/2011 CT  Findings: Hiatal hernia.  Surgical clips project over the trachea. Mild bibasilar interstitial prominence / linear opacity may reflect mild  atelectasis.  Otherwise, without focal consolidation.  There are numerous surgical clips throughout the abdomen.  There are a few nonspecific air-fluid levels.  Relative paucity of distal bowel gas.  No free intraperitoneal air.  No acute osseous abnormality.  IMPRESSION: Nonspecific bowel gas pattern with a few air-fluid levels however no bowel dilatation.  No free intraperitoneal air.  Hiatal hernia.  Original Report Authenticated By: Waneta Martins, M.D.   Medications: Scheduled Meds:   . calcium-vitamin D  1 tablet Oral Daily  . cholestyramine  1 packet Oral Custom  . dicyclomine  20 mg Oral TID AC & HS  . enoxaparin  40 mg Subcutaneous Q24H  . Flora-Q  1 capsule Oral Daily  . multivitamins ther. w/minerals  1 tablet Oral Daily  . pantoprazole (PROTONIX) IV  40 mg Intravenous Q12H  . potassium chloride  10 mEq Intravenous Q1 Hr x 6  . potassium chloride  40 mEq Oral BID   Continuous Infusions:   . 0.9 % NaCl with  KCl 40 mEq / L 100 mL/hr at 11/07/11 1002   PRN Meds:.acetaminophen, acetaminophen, alum & mag hydroxide-simeth, HYDROmorphone, loperamide, oxyCODONE, promethazine, promethazine, zolpidem  Assessment/Plan: Patient Active Hospital Problem List: Hypokalemia (11/06/2011)   repleted currently her potassium level is 3.4. She still has potassium running in her IV.  Chronic diarrhea (11/06/2011) likely secondary to Crohn's flareup. Currently does not have any indication for steroids. C. difficile PCR negative stool cultures negative so far. When necessary Imodium  Dysphagia (11/06/2011)  chronic dysphagia she had an upper endoscopy many years ago and was told it was within normal limits. We'll get a speech and swallow evaluation and barium swallow in a.m. if needed.  Leukocytosis (11/06/2011)  improving  Thrombocytosis (11/06/2011)  reactive   LOS: 2 days   Danielle [name] 11/08/2011, 5:17 PM

## 2011-11-09 LAB — CBC
Hemoglobin: 10 g/dL — ABNORMAL LOW (ref 12.0–15.0)
MCH: 26.5 pg (ref 26.0–34.0)
MCHC: 32.2 g/dL (ref 30.0–36.0)
MCV: 82.5 fL (ref 78.0–100.0)
Platelets: 360 10*3/uL (ref 150–400)

## 2011-11-09 LAB — BASIC METABOLIC PANEL
CO2: 14 mEq/L — ABNORMAL LOW (ref 19–32)
Calcium: 8.1 mg/dL — ABNORMAL LOW (ref 8.4–10.5)
Creatinine, Ser: 1.05 mg/dL (ref 0.50–1.10)
GFR calc non Af Amer: 55 mL/min — ABNORMAL LOW (ref >90)
Glucose, Bld: 79 mg/dL (ref 70–99)
Sodium: 138 mEq/L (ref 135–145)

## 2011-11-09 MED ORDER — DIPHENHYDRAMINE HCL 25 MG PO CAPS
25.0000 mg | ORAL_CAPSULE | Freq: Once | ORAL | Status: AC
  Administered 2011-11-09: 25 mg via ORAL
  Filled 2011-11-09 (×2): qty 1

## 2011-11-09 MED ORDER — CIPROFLOXACIN IN D5W 400 MG/200ML IV SOLN
400.0000 mg | Freq: Two times a day (BID) | INTRAVENOUS | Status: DC
  Administered 2011-11-09 – 2011-11-13 (×7): 400 mg via INTRAVENOUS
  Filled 2011-11-09 (×10): qty 200

## 2011-11-09 MED ORDER — METRONIDAZOLE IN NACL 5-0.79 MG/ML-% IV SOLN
500.0000 mg | Freq: Three times a day (TID) | INTRAVENOUS | Status: DC
  Administered 2011-11-10 – 2011-11-11 (×7): 500 mg via INTRAVENOUS
  Filled 2011-11-09 (×12): qty 100

## 2011-11-09 NOTE — Progress Notes (Signed)
Subjective: Pt had 3 loose bowel movement this am. Pt spiked fever yesterday. She states she feels better.   Objective: Weight change: 1.587 kg (3 lb 8 oz)  Intake/Output Summary (Last 24 hours) at 11/09/11 1526 Last data filed at 11/09/11 1329  Gross per 24 hour  Intake    840 ml  Output      4 ml  Net    836 ml   On exam She is alert afebrile, in no acute distress. CVS: S1 S2 heard Lungs: Clear Abdomen: soft non tender and non distended. Extremities: no edema.  Neuro: no new focal deficits.  Lab Results: Results for orders placed during the hospital encounter of 11/06/11 (from the past 24 hour(s))  CBC     Status: Abnormal   Collection Time   11/09/11  9:45 AM      Component Value Range   WBC 14.1 (*) 4.0 - 10.5 (K/uL)   RBC 3.77 (*) 3.87 - 5.11 (MIL/uL)   Hemoglobin 10.0 (*) 12.0 - 15.0 (g/dL)   HCT 11.9 (*) 14.7 - 46.0 (%)   MCV 82.5  78.0 - 100.0 (fL)   MCH 26.5  26.0 - 34.0 (pg)   MCHC 32.2  30.0 - 36.0 (g/dL)   RDW 82.9  56.2 - 13.0 (%)   Platelets 360  150 - 400 (K/uL)  BASIC METABOLIC PANEL     Status: Abnormal   Collection Time   11/09/11  9:45 AM      Component Value Range   Sodium 138  135 - 145 (mEq/L)   Potassium 3.6  3.5 - 5.1 (mEq/L)   Chloride 116 (*) 96 - 112 (mEq/L)   CO2 14 (*) 19 - 32 (mEq/L)   Glucose, Bld 79  70 - 99 (mg/dL)   BUN 6  6 - 23 (mg/dL)   Creatinine, Ser 8.65  0.50 - 1.10 (mg/dL)   Calcium 8.1 (*) 8.4 - 10.5 (mg/dL)   GFR calc non Af Amer 55 (*) >90 (mL/min)   GFR calc Af Amer 64 (*) >90 (mL/min)     Micro Results: Recent Results (from the past 240 hour(s))  STOOL CULTURE     Status: Normal (Preliminary result)   Collection Time   11/07/11 12:59 AM      Component Value Range Status Comment   Specimen Description STOOL   Final    Special Requests NONE   Final    Culture NO SUSPICIOUS COLONIES, CONTINUING TO HOLD   Final    Report Status PENDING   Incomplete   CLOSTRIDIUM DIFFICILE BY PCR     Status: Normal   Collection  Time   11/07/11  1:00 AM      Component Value Range Status Comment   C difficile by pcr NEGATIVE  NEGATIVE  Final     Studies/Results: Dg Chest 2 View  11/06/2011  *RADIOLOGY REPORT*  Clinical Data: Cough with increasing sputum.  CHEST - 2 VIEW  Comparison: Chest radiograph 06/05/2010 and 05/28/2010  Findings: Cardiopericardial silhouette is upper normal in size and stable.  Stable thoracic aorta and hilar contours are normal pulmonary vascularity.  Moderate to large hiatal hernia appears similar to prior examination.  The lungs are normally expanded and clear.  There is no pleural effusion. Bones appear osteopenic.  No acute bony abnormality identified.  Mild gaseous distention of visualized colon in the left upper quadrant, similar to prior exam.  IMPRESSION: Moderate to large hiatal hernia.  No acute cardiopulmonary disease.  Original Report  Authenticated By: Britta Mccreedy, M.D.   Dg Abd Acute W/chest  10/13/2011  *RADIOLOGY REPORT*  Clinical Data: Abdominal pain.  ACUTE ABDOMEN SERIES (ABDOMEN 2 VIEW & CHEST 1 VIEW)  Comparison: 04/02/2011 CT  Findings: Hiatal hernia.  Surgical clips project over the trachea. Mild bibasilar interstitial prominence / linear opacity may reflect mild atelectasis.  Otherwise, without focal consolidation.  There are numerous surgical clips throughout the abdomen.  There are a few nonspecific air-fluid levels.  Relative paucity of distal bowel gas.  No free intraperitoneal air.  No acute osseous abnormality.  IMPRESSION: Nonspecific bowel gas pattern with a few air-fluid levels however no bowel dilatation.  No free intraperitoneal air.  Hiatal hernia.  Original Report Authenticated By: Waneta Martins, M.D.   Medications: Scheduled Meds:   . calcium-vitamin D  1 tablet Oral Daily  . cholestyramine  1 packet Oral Custom  . dicyclomine  20 mg Oral TID AC & HS  . enoxaparin  40 mg Subcutaneous Q24H  . Flora-Q  1 capsule Oral Daily  . multivitamins ther. w/minerals   1 tablet Oral Daily  . pantoprazole  40 mg Oral Q1200  . DISCONTD: pantoprazole (PROTONIX) IV  40 mg Intravenous Q12H   Continuous Infusions:   . 0.9 % NaCl with KCl 40 mEq / L 100 mL/hr at 11/09/11 0826   PRN Meds:.acetaminophen, acetaminophen, alum & mag hydroxide-simeth, HYDROmorphone, loperamide, oxyCODONE, promethazine, promethazine, zolpidem  Assessment/Plan:  Hypokalemia (11/06/2011) repleted. Chronic diarrhea (11/06/2011) Probably viral in origin. Stool cultures are negative.  C diff pcr negative. Persistent leukocytosis, with fever overnight. Blood cultures negative so far. Will Call gastroenterology in am if diarrhea doesn't improve in the next 24 hours.  Dysphagia (11/06/2011)  swallow eval pending Pt has mod to large hiatal hernia.  Leukocytosis (11/06/2011)  persistent. Fever: will start pt on empirically on iv cipro and iv flagyl.    LOS: 3 days   Danielle [name] 11/09/2011, 3:26 PM

## 2011-11-09 NOTE — Progress Notes (Signed)
Utilization Review Completed.Carmita Boom T12/02/2011   

## 2011-11-10 LAB — BASIC METABOLIC PANEL
CO2: 13 mEq/L — ABNORMAL LOW (ref 19–32)
Calcium: 8.1 mg/dL — ABNORMAL LOW (ref 8.4–10.5)
Creatinine, Ser: 1.08 mg/dL (ref 0.50–1.10)
GFR calc Af Amer: 62 mL/min — ABNORMAL LOW (ref >90)
GFR calc non Af Amer: 53 mL/min — ABNORMAL LOW (ref >90)
Sodium: 137 mEq/L (ref 135–145)

## 2011-11-10 LAB — CBC
MCH: 26.3 pg (ref 26.0–34.0)
MCHC: 31.9 g/dL (ref 30.0–36.0)
MCV: 82.5 fL (ref 78.0–100.0)
Platelets: 348 10*3/uL (ref 150–400)
RBC: 3.72 MIL/uL — ABNORMAL LOW (ref 3.87–5.11)
RDW: 15.4 % (ref 11.5–15.5)

## 2011-11-10 LAB — STOOL CULTURE

## 2011-11-10 MED ORDER — SODIUM CHLORIDE 0.9 % IV SOLN
Freq: Once | INTRAVENOUS | Status: AC
  Administered 2011-11-11: 500 mL via INTRAVENOUS

## 2011-11-10 MED ORDER — SODIUM CHLORIDE 0.9 % IV SOLN: Freq: Once | INTRAVENOUS | Status: DC

## 2011-11-10 NOTE — Progress Notes (Signed)
Subjective:  Patient states she feels better today, she had 3 episodes of diarrhea. Slightly nauseous no episodes of vomiting. Denies any abdominal pain. Objective: Weight change: 0.046 kg (1.6 oz)  Intake/Output Summary (Last 24 hours) at 11/10/11 1851 Last data filed at 11/10/11 1700  Gross per 24 hour  Intake    360 ml  Output   1200 ml  Net   -840 ml   On exam she's alert afebrile oriented x3 comfortable in no acute distress  Cardiovascular exam S1-S2 heard  Respiratory exam lungs clear to auscultation bilaterally no wheezing or rhonchi  Abdomen is soft nontender nondistended bowel sounds are heard  Extremities no pedal edema cyanosis or clubbing  Neurological exam patient is alert oriented x3 no focal deficits  Lab Results: Results for orders placed during the hospital encounter of 11/06/11 (from the past 24 hour(s))  CBC     Status: Abnormal   Collection Time   11/10/11  6:40 AM      Component Value Range   WBC 12.2 (*) 4.0 - 10.5 (K/uL)   RBC 3.72 (*) 3.87 - 5.11 (MIL/uL)   Hemoglobin 9.8 (*) 12.0 - 15.0 (g/dL)   HCT 29.5 (*) 62.1 - 46.0 (%)   MCV 82.5  78.0 - 100.0 (fL)   MCH 26.3  26.0 - 34.0 (pg)   MCHC 31.9  30.0 - 36.0 (g/dL)   RDW 30.8  65.7 - 84.6 (%)   Platelets 348  150 - 400 (K/uL)  BASIC METABOLIC PANEL     Status: Abnormal   Collection Time   11/10/11  6:40 AM      Component Value Range   Sodium 137  135 - 145 (mEq/L)   Potassium 3.9  3.5 - 5.1 (mEq/L)   Chloride 116 (*) 96 - 112 (mEq/L)   CO2 13 (*) 19 - 32 (mEq/L)   Glucose, Bld 89  70 - 99 (mg/dL)   BUN 5 (*) 6 - 23 (mg/dL)   Creatinine, Ser 9.62  0.50 - 1.10 (mg/dL)   Calcium 8.1 (*) 8.4 - 10.5 (mg/dL)   GFR calc non Af Amer 53 (*) >90 (mL/min)   GFR calc Af Amer 62 (*) >90 (mL/min)     Micro Results: Recent Results (from the past 240 hour(s))  STOOL CULTURE     Status: Normal   Collection Time   11/07/11 12:59 AM      Component Value Range Status Comment   Specimen Description STOOL    Final    Special Requests NONE   Final    Culture     Final    Value: NO SALMONELLA, SHIGELLA, CAMPYLOBACTER, OR YERSINIA ISOLATED   Report Status 11/10/2011 FINAL   Final   CLOSTRIDIUM DIFFICILE BY PCR     Status: Normal   Collection Time   11/07/11  1:00 AM      Component Value Range Status Comment   C difficile by pcr NEGATIVE  NEGATIVE  Final     Studies/Results: Dg Chest 2 View  11/06/2011  *RADIOLOGY REPORT*  Clinical Data: Cough with increasing sputum.  CHEST - 2 VIEW  Comparison: Chest radiograph 06/05/2010 and 05/28/2010  Findings: Cardiopericardial silhouette is upper normal in size and stable.  Stable thoracic aorta and hilar contours are normal pulmonary vascularity.  Moderate to large hiatal hernia appears similar to prior examination.  The lungs are normally expanded and clear.  There is no pleural effusion. Bones appear osteopenic.  No acute bony abnormality identified.  Mild  gaseous distention of visualized colon in the left upper quadrant, similar to prior exam.  IMPRESSION: Moderate to large hiatal hernia.  No acute cardiopulmonary disease.  Original Report Authenticated By: Britta Mccreedy, M.D.   Dg Abd Acute W/chest  10/13/2011  *RADIOLOGY REPORT*  Clinical Data: Abdominal pain.  ACUTE ABDOMEN SERIES (ABDOMEN 2 VIEW & CHEST 1 VIEW)  Comparison: 04/02/2011 CT  Findings: Hiatal hernia.  Surgical clips project over the trachea. Mild bibasilar interstitial prominence / linear opacity may reflect mild atelectasis.  Otherwise, without focal consolidation.  There are numerous surgical clips throughout the abdomen.  There are a few nonspecific air-fluid levels.  Relative paucity of distal bowel gas.  No free intraperitoneal air.  No acute osseous abnormality.  IMPRESSION: Nonspecific bowel gas pattern with a few air-fluid levels however no bowel dilatation.  No free intraperitoneal air.  Hiatal hernia.  Original Report Authenticated By: Waneta Martins, M.D.   Medications: Scheduled  Meds:   . sodium chloride   Intravenous Once  . sodium chloride   Intravenous Once  . calcium-vitamin D  1 tablet Oral Daily  . cholestyramine  1 packet Oral Custom  . ciprofloxacin  400 mg Intravenous Q12H  . diphenhydrAMINE  25 mg Oral Once  . enoxaparin  40 mg Subcutaneous Q24H  . Flora-Q  1 capsule Oral Daily  . metronidazole  500 mg Intravenous Q8H  . multivitamins ther. w/minerals  1 tablet Oral Daily  . pantoprazole  40 mg Oral Q1200  . DISCONTD: dicyclomine  20 mg Oral TID AC & HS   Continuous Infusions:   . 0.9 % NaCl with KCl 40 mEq / L 100 mL/hr at 11/10/11 1018   PRN Meds:.acetaminophen, acetaminophen, alum & mag hydroxide-simeth, HYDROmorphone, loperamide, oxyCODONE, promethazine, promethazine, zolpidem  Assessment/Plan: Patient Active Hospital Problem List: Hypokalemia (11/06/2011)  repleted  Chronic diarrhea (11/06/2011): C. difficile PCR negative, stool culture is negative. Unclear etiology. Probably viral gastroenteritis. The patient continues to spike fevers with persistent leukocytosis and start the patient on IV Cipro and Flagyl empirically. Call gastroenterology consult from [facility] for further recommendations.  currently she is on Imodium as needed.  /? Crohn's flareup : Patient is on maintenance infliximab injections getting from  Dr Patty Sermons office. This does not look like a typical Crohn's flareup. Going for signal sigmoidoscopy in a.m.   Dysphagia (11/06/2011)  patient has moderate to large hiatal hernia. Going for upper endoscopy in a.m. for further evaluation of dysphagia.  Leukocytosis (11/06/2011): Probably secondary to infectious diarrhea.    LOS: 4 days   Trustin Chapa 11/10/2011, 6:51 PM

## 2011-11-10 NOTE — Consult Note (Signed)
[facility] Consultation Note  Primary Care Physician:  [name], MD Primary Gastroenterologist:  Dr. Lionel December  Reason for Consultation:  Odynophagia, diarrhea, history Crohn's disease  HPI: Danielle [name] is a 65 y.o. female with history of Crohn's disease > 30 years on maintenance infliximab, under care of Dr. Lionel December.  Was admitted to the hospital couple days ago with nausea, vomiting, dysphagia, and diarrhea.  Just after Thanksgiving, she had what sounds like transient food impaction episode that resolved spontaneously.  Since then, she has had troubles with odynophagia, but has had no further dysphagia.  Speech Therapy evaluation showed no oropharyngeal source.  Prior endoscopy many years ago, no known results.  Also with diarrhea for the past couple weeks, 3-5 watery non-bloody stools daily.  No abdominal pain. Was on course of antibiotics for abscessed tooth recently.  Last colonoscopy many years ago.  Failure to thrive, weakness, subjective weight loss also endorsed.  Has had some low-grade temperatures (~ 100.5) since hospitalized.   Past Medical History  Diagnosis Date  . Crohn's disease   . Chronic diarrhea   . Joint pain   . GERD (gastroesophageal reflux disease)   . Polyarthralgia     Past Surgical History  Procedure Date  . Bowel resection   . Colostomy     [name]  . Anal stenosis   . Colonoscopy 2000  . Cholecystectomy   . Appendectomy   . Goiter 1999    Prior to Admission medications   Medication Sig Start Date End Date Taking? Authorizing Provider  Calcium-Vitamin D 600-200 MG-UNIT per tablet Take 1 tablet by mouth daily.     Yes Historical Provider, MD  cholestyramine Lanetta Inch) 4 G packet Take 1 packet by mouth 2 (two) times daily. 10/06/11 10/05/12 Yes Malissa Hippo, MD  Cyanocobalamin (VITAMIN B-12 IJ) Inject as directed every 30 (thirty) days.     Yes Historical Provider, MD  dicyclomine (BENTYL) 10 MG/5ML syrup Take 20 mg by  mouth 4 (four) times daily -  before meals and at bedtime.    Yes Historical Provider, MD  HYDROcodone-acetaminophen (LORTAB 7.5) 7.5-500 MG per tablet Take 1 Tablet By Mouth 3 Times a Day As Needed 07/27/11  Yes Llana Aliment, NP  multivitamin [facility]) per tablet Take 1 tablet by mouth daily.     Yes Historical Provider, MD  nystatin-triamcinolone (MYCOLOG II) cream Apply topically 4 (four) times daily.    Yes Historical Provider, MD  pantoprazole (PROTONIX) 40 MG tablet Take 40 mg by mouth daily.     Yes Historical Provider, MD  Probiotic Product (PROBIOTIC FORMULA PO) Take 1 tablet by mouth daily.    Yes Historical Provider, MD  promethazine (PHENERGAN) 25 MG tablet Take 25 mg by mouth every 6 (six) hours as needed. Nausea   Yes Historical Provider, MD  inFLIXimab (REMICADE) 100 MG injection Inject 100 mg into the vein every 6 (six) weeks. Last infusion was in October , next infusion 11-26-11    Historical Provider, MD  zolpidem (AMBIEN) 10 MG tablet Take 10 mg by mouth as needed. insomnia 08/24/11   Historical Provider, MD    Current Facility-Administered Medications  Medication Dose Route Frequency Provider Last Rate Last Dose  . 0.9 %  sodium chloride infusion   Intravenous Once Vijaya Akula      . 0.9 % NaCl with KCl 40 mEq / L  infusion   Intravenous Continuous Ron Parker, MD 100 mL/hr at 11/10/11 1018    .  acetaminophen (TYLENOL) tablet 650 mg  650 mg Oral Q6H PRN Ron Parker, MD       Or  . acetaminophen (TYLENOL) suppository 650 mg  650 mg Rectal Q6H PRN Ron Parker, MD      . alum & mag hydroxide-simeth (MAALOX/MYLANTA) 200-200-20 MG/5ML suspension 30 mL  30 mL Oral Q6H PRN Ron Parker, MD      . calcium-vitamin D (OSCAL WITH D) 500-200 MG-UNIT per tablet 1 tablet  1 tablet Oral Daily Vijaya Akula   1 tablet at 11/10/11 1032  . cholestyramine (QUESTRAN) packet 1 packet  1 packet Oral Custom Ron Parker, MD   1 packet at 11/07/11 1300  .  ciprofloxacin (CIPRO) IVPB 400 mg  400 mg Intravenous Q12H Vijaya Akula   400 mg at 11/10/11 1232  . diphenhydrAMINE (BENADRYL) capsule 25 mg  25 mg Oral Once Shanker Ghimire   25 mg at 11/09/11 2356  . enoxaparin (LOVENOX) injection 40 mg  40 mg Subcutaneous Q24H Ron Parker, MD   40 mg at 11/09/11 2145  . Flora-Q (FLORA-Q) Capsule 1 capsule  1 capsule Oral Daily Judie Bonus Hammons, PHARMD   1 capsule at 11/10/11 1032  . HYDROmorphone (DILAUDID) injection 0.5-1 mg  0.5-1 mg Intravenous Q3H PRN Ron Parker, MD   1 mg at 11/10/11 0937  . loperamide (IMODIUM) capsule 2 mg  2 mg Oral PRN Vijaya Akula   2 mg at 11/09/11 1154  . metroNIDAZOLE (FLAGYL) IVPB 500 mg  500 mg Intravenous Q8H Vijaya Akula   500 mg at 11/10/11 0703  . multivitamins ther. w/minerals tablet 1 tablet  1 tablet Oral Daily Judie Bonus Hammons, PHARMD   1 tablet at 11/10/11 1033  . oxyCODONE (Oxy IR/ROXICODONE) immediate release tablet 5 mg  5 mg Oral Q4H PRN Ron Parker, MD      . pantoprazole (PROTONIX) EC tablet 40 mg  40 mg Oral Q1200 Kendra P Hiatt, PHARMD   40 mg at 11/10/11 1250  . promethazine (PHENERGAN) tablet 12.5 mg  12.5 mg Oral Q6H PRN Ron Parker, MD       Or  . promethazine (PHENERGAN) injection 12.5 mg  12.5 mg Intravenous Q6H PRN Ron Parker, MD   12.5 mg at 11/10/11 1019  . zolpidem (AMBIEN) tablet 5 mg  5 mg Oral QHS PRN Ron Parker, MD   5 mg at 11/08/11 2308  . DISCONTD: dicyclomine (BENTYL) 10 MG/5ML syrup 20 mg  20 mg Oral TID AC & HS Ron Parker, MD   20 mg at 11/10/11 0703   Facility-Administered Medications Ordered in Other Encounters  Medication Dose Route Frequency Provider Last Rate Last Dose  . loratadine (CLARITIN) tablet 10 mg  10 mg Oral Daily Malissa Hippo, MD   10 mg at 10/21/11 1345  . sodium chloride 0.9 % injection 10 mL  10 mL Intravenous PRN Malissa Hippo, MD   10 mL at 10/21/11 1324    Allergies as of 11/06/2011 - Review  Complete 11/06/2011  Allergen Reaction Noted  . Aspirin Other (See Comments) 07/22/2011  . Morphine and related Itching 07/22/2011  . Sulfa antibiotics Nausea And Vomiting 11/06/2011    Family History  Problem Relation Age of Onset  . Diabetes Mother   . Healthy Sister   . Hypertension Brother   . Colon cancer Brother     History   Social History  . Marital Status: Single    Spouse  Name: N/A    Number of Children: N/A  . Years of Education: N/A   Occupational History  . Not on file.   Social History Main Topics  . Smoking status: Former Smoker -- 4 years    Types: Cigarettes    Quit date: 10/05/1996  . Smokeless tobacco: Never Used   Comment: 1 pack every two weeks when she smoked  . Alcohol Use: No  . Drug Use: No  . Sexually Active: Not on file   Other Topics Concern  . Not on file   Social History Narrative  . No narrative on file    Review of Systems: Positive for: fevers, failure to thrive, weakness, fatigue, weight loss Gen: Denies sleep disorder CV: Denies chest pain, angina, palpitations, syncope, orthopnea, PND, peripheral edema, and claudication. Resp: Denies dyspnea, cough, sputum, wheezing, coughing up blood. GI: Described in detail in HPI.    GU : Denies urinary burning, blood in urine, urinary frequency, urinary hesitancy, nocturnal urination, and urinary incontinence. MS: Denies joint pain or swelling.  Denies muscle weakness, cramps, atrophy.  Derm: Denies rash, itching, oral ulcerations, hives, unhealing ulcers.  Psych: Denies depression, anxiety, memory loss, suicidal ideation, hallucinations,  and confusion. Heme: Denies bruising, bleeding, and enlarged lymph nodes. Neuro:  Denies any headaches, dizziness, paresthesias. Endo:  Denies any problems with DM, thyroid, adrenal function.  Physical Exam: Vital signs in last 24 hours: Temp:  [98.1 F (36.7 C)-100.7 F (38.2 C)] 100.7 F (38.2 C) (12/04 1427) Pulse Rate:  [92-95] 95  (12/04  1427) Resp:  [18-19] 19  (12/04 1427) BP: (89-95)/(55-57) 95/57 mmHg (12/04 1427) SpO2:  [98 %-100 %] 99 % (12/04 1427) Weight:  [73.846 kg (162 lb 12.8 oz)] 162 lb 12.8 oz (73.846 kg) (12/04 0541) Last BM Date: 11/09/11 General:   Alert,  Well-developed, well-nourished, pleasant and cooperative in NAD Head:  Normocephalic and atraumatic. Eyes:  Sclera clear, no icterus.   Conjunctiva pink. Ears:  Normal auditory acuity. Nose:  No deformity, discharge,  or lesions. Mouth:  No deformity or lesions.  Somewhat dry mucous membranes Neck:  Supple; no masses or thyromegaly. Lungs:  Clear throughout to auscultation.   No wheezes, crackles, or rhonchi. No acute distress. Heart:  Regular rate and rhythm; no murmurs, clicks, rubs,  or gallops. Abdomen:  Soft, nontender and nondistended. No masses, hepatosplenomegaly or hernias noted. Normal bowel sounds, without guarding, and without rebound.     Msk:  Symmetrical without gross deformities. Normal posture. Pulses:  Normal pulses noted. Extremities:  Without clubbing or edema. Neurologic:  Alert and  oriented x4;  grossly normal neurologically. Skin:  Intact without significant lesions or rashes. Psych:  Alert and cooperative. Normal mood and affect.   Lab Results:  Basename 11/10/11 0640 11/09/11 0945 11/08/11 0640  WBC 12.2* 14.1* 12.8*  HGB 9.8* 10.0* 9.9*  HCT 30.7* 31.1* 30.0*  PLT 348 360 392   BMET  Basename 11/10/11 0640 11/09/11 0945 11/08/11 0640  NA 137 138 138  K 3.9 3.6 3.4*  CL 116* 116* 119*  CO2 13* 14* 13*  GLUCOSE 89 79 76  BUN 5* 6 5*  CREATININE 1.08 1.05 1.11*  CALCIUM 8.1* 8.1* 8.3*   LFT No results found for this basename: PROT,ALBUMIN,AST,ALT,ALKPHOS,BILITOT,BILIDIR,IBILI in the last 72 hours PT/INR No results found for this basename: LABPROT:2,INR:2 in the last 72 hours  Studies/Results: No results found.  Impression:  1.  Dysphagia, resolved.  Query motility disturbance versus stricture. 2.   Odynophagia.  Recent antibiotics.  Query esophagitis (infectious versus other) versus pill ulceration. 3.  Diarrhea with hypokalemia.  Stool studies unrevealing.  History Crohn's disease. 4.  Crohn's disease.  Longstanding.  Managed by Dr. Karilyn Cota.  Plan:  1.  Clear liquids, NPO after midnight. 2.  Empiric PPI therapy. 3.  EGD and flexible sigmoidoscopy tomorrow. 4.  If endoscopy/sigmoidoscopy are unrevealing, consider CT chest/abd/pelvis with contrast.   LOS: 4 days   Vertis Scheib M  11/10/2011, 3:01 PM

## 2011-11-10 NOTE — Progress Notes (Addendum)
Speech Language/Pathology Clinical/Bedside Swallow Evaluation Patient Details  Name: Danielle [name] MRN: [id number] DOB: [birth year] Today's Date: 11/10/2011  Past Medical History:  Past Medical History  Diagnosis Date  . Crohn's disease   . Chronic diarrhea   . Joint pain   . GERD (gastroesophageal reflux disease)   . Polyarthralgia    Past Surgical History:  Past Surgical History  Procedure Date  . Bowel resection   . Colostomy     [name]  . Anal stenosis   . Colonoscopy 2000  . Cholecystectomy   . Appendectomy   . Goiter 1999   Other Pertinent Information: 65 yo F with hx Crohn's disease describes event during Thanksgiving when bolus of chicken was lodged in esophagus; pt was able to expectorate.  She states that since that episode, she has had chronic sensation of pressure/pain in right throat/chest.  Descibes production of copious, clear, but thick sputum since that time.  Moderate to large hiatal hernia.  Clinical Impression: Pt presents with normal oropharyngeal swallow with no indication of pharyngeal deficits nor compromised airway protection.  Her described symptoms: globus, regurgitation of chicken bolus with subsequent chronic pain/pressure, increased mucous production/expectoration, suggest an esophageal condition.  Recommend consideration of a GI consult.  No further SLP f/u warranted.   Other Related Risk Factors:  HH  Recommendations Recommended Consults: Consider GI evaluation Solid Consistency: Regular POs when approved for advanced diet per MD Liquid Consistency: Thin Medication Administration: Whole meds with liquid Supervision: Patient able to self feed Compensations: Slow rate;Follow solids with liquid Postural Changes and/or Swallow Maneuvers: Seated upright 90 degrees;Upright 30-60 min after meal    Di Jasmer L. Samson Frederic, [place] CCC/SLP Pager [phone]  Blenda Mounts Laurice 11/10/2011,12:16 PM

## 2011-11-11 ENCOUNTER — Inpatient Hospital Stay (HOSPITAL_COMMUNITY): Payer: Medicare Other

## 2011-11-11 ENCOUNTER — Encounter (HOSPITAL_COMMUNITY): Payer: Self-pay

## 2011-11-11 ENCOUNTER — Encounter (HOSPITAL_COMMUNITY)
Admission: EM | Disposition: A | Discharge status: Home / Self Care | Payer: Self-pay | Source: Home / Self Care | Attending: Internal Medicine

## 2011-11-11 HISTORY — PX: FLEXIBLE SIGMOIDOSCOPY: SHX5431

## 2011-11-11 HISTORY — PX: ESOPHAGOGASTRODUODENOSCOPY: SHX5428

## 2011-11-11 LAB — CBC
MCH: 26.4 pg (ref 26.0–34.0)
MCHC: 32.2 g/dL (ref 30.0–36.0)
RDW: 15.3 % (ref 11.5–15.5)

## 2011-11-11 LAB — COMPREHENSIVE METABOLIC PANEL
ALT: 11 U/L (ref 0–35)
Alkaline Phosphatase: 95 U/L (ref 39–117)
CO2: 12 mEq/L — ABNORMAL LOW (ref 19–32)
Calcium: 8 mg/dL — ABNORMAL LOW (ref 8.4–10.5)
Chloride: 114 mEq/L — ABNORMAL HIGH (ref 96–112)
GFR calc Af Amer: 66 mL/min — ABNORMAL LOW (ref >90)
GFR calc non Af Amer: 57 mL/min — ABNORMAL LOW (ref >90)
Glucose, Bld: 70 mg/dL (ref 70–99)
Potassium: 3.5 mEq/L (ref 3.5–5.1)
Sodium: 135 mEq/L (ref 135–145)
Total Bilirubin: 0.2 mg/dL — ABNORMAL LOW (ref 0.3–1.2)

## 2011-11-11 SURGERY — EGD (ESOPHAGOGASTRODUODENOSCOPY)
Anesthesia: Moderate Sedation

## 2011-11-11 MED ORDER — FENTANYL CITRATE 0.05 MG/ML IJ SOLN
INTRAMUSCULAR | Status: DC | PRN
  Administered 2011-11-11 (×4): 25 ug via INTRAVENOUS

## 2011-11-11 MED ORDER — MIDAZOLAM HCL 10 MG/2ML IJ SOLN
INTRAMUSCULAR | Status: DC | PRN
  Administered 2011-11-11 (×4): 2 mg via INTRAVENOUS

## 2011-11-11 MED ORDER — FENTANYL CITRATE 0.05 MG/ML IJ SOLN
INTRAMUSCULAR | Status: AC
  Filled 2011-11-11: qty 2

## 2011-11-11 MED ORDER — MIDAZOLAM HCL 10 MG/2ML IJ SOLN
INTRAMUSCULAR | Status: AC
  Filled 2011-11-11: qty 2

## 2011-11-11 MED ORDER — FLUCONAZOLE 150 MG PO TABS
150.0000 mg | ORAL_TABLET | Freq: Once | ORAL | Status: AC
  Administered 2011-11-11: 150 mg via ORAL
  Filled 2011-11-11: qty 1

## 2011-11-11 MED ORDER — BUTAMBEN-TETRACAINE-BENZOCAINE 2-2-14 % EX AERO
INHALATION_SPRAY | CUTANEOUS | Status: DC | PRN
  Administered 2011-11-11: 2 via TOPICAL

## 2011-11-11 NOTE — Progress Notes (Signed)
Subjective:  Patient states she is hungry and wants to eat, still has diarrhea but no abdominal pain or vomiting.   Objective: Weight change: 0.754 kg (1 lb 10.6 oz)  Intake/Output Summary (Last 24 hours) at 11/11/11 1531 Last data filed at 11/11/11 1017  Gross per 24 hour  Intake    150 ml  Output    950 ml  Net   -800 ml   Blood pressure 99/65, pulse 105, temperature 100.2 F (37.9 C), temperature source Oral, resp. rate 19, height 5\' 4"  (1.626 m), weight 74.6 kg (164 lb 7.4 oz), SpO2 99.00%.  On exam Gen: Alert and oriented x3 comfortable, no acute distress HEENT: Anicteric sclera. PERLA CVS : S1-S2 clear, no murmur rubs or gallops  Lungs:clear to auscultation bilaterally no wheezing or rhonchi Abdomen is soft nontender nondistended bowel sounds are heard Extremities no edema, cyanosis or clubbing Neurological exam patient is alert oriented x3 no focal deficits  Lab Results: Results for orders placed during the hospital encounter of 11/06/11 (from the past 24 hour(s))  COMPREHENSIVE METABOLIC PANEL     Status: Abnormal   Collection Time   11/11/11  5:08 AM      Component Value Range   Sodium 135  135 - 145 (mEq/L)   Potassium 3.5  3.5 - 5.1 (mEq/L)   Chloride 114 (*) 96 - 112 (mEq/L)   CO2 12 (*) 19 - 32 (mEq/L)   Glucose, Bld 70  70 - 99 (mg/dL)   BUN 4 (*) 6 - 23 (mg/dL)   Creatinine, Ser 1.61  0.50 - 1.10 (mg/dL)   Calcium 8.0 (*) 8.4 - 10.5 (mg/dL)   Total Protein 6.3  6.0 - 8.3 (g/dL)   Albumin 2.2 (*) 3.5 - 5.2 (g/dL)   AST 14  0 - 37 (U/L)   ALT 11  0 - 35 (U/L)   Alkaline Phosphatase 95  39 - 117 (U/L)   Total Bilirubin 0.2 (*) 0.3 - 1.2 (mg/dL)   GFR calc non Af Amer 57 (*) >90 (mL/min)   GFR calc Af Amer 66 (*) >90 (mL/min)  CBC     Status: Abnormal   Collection Time   11/11/11  5:08 AM      Component Value Range   WBC 13.0 (*) 4.0 - 10.5 (K/uL)   RBC 3.52 (*) 3.87 - 5.11 (MIL/uL)   Hemoglobin 9.3 (*) 12.0 - 15.0 (g/dL)   HCT 09.6 (*) 04.5 - 46.0 (%)     MCV 82.1  78.0 - 100.0 (fL)   MCH 26.4  26.0 - 34.0 (pg)   MCHC 32.2  30.0 - 36.0 (g/dL)   RDW 40.9  81.1 - 91.4 (%)   Platelets 348  150 - 400 (K/uL)     Micro Results: Recent Results (from the past 240 hour(s))  STOOL CULTURE     Status: Normal   Collection Time   11/07/11 12:59 AM      Component Value Range Status Comment   Specimen Description STOOL   Final    Special Requests NONE   Final    Culture     Final    Value: NO SALMONELLA, SHIGELLA, CAMPYLOBACTER, OR YERSINIA ISOLATED   Report Status 11/10/2011 FINAL   Final   CLOSTRIDIUM DIFFICILE BY PCR     Status: Normal   Collection Time   11/07/11  1:00 AM      Component Value Range Status Comment   C difficile by pcr NEGATIVE  NEGATIVE  Final  Studies/Results: Dg Chest 2 View  11/06/2011  *RADIOLOGY REPORT*  Clinical Data: Cough with increasing sputum.  CHEST - 2 VIEW  Comparison: Chest radiograph 06/05/2010 and 05/28/2010  Findings: Cardiopericardial silhouette is upper normal in size and stable.  Stable thoracic aorta and hilar contours are normal pulmonary vascularity.  Moderate to large hiatal hernia appears similar to prior examination.  The lungs are normally expanded and clear.  There is no pleural effusion. Bones appear osteopenic.  No acute bony abnormality identified.  Mild gaseous distention of visualized colon in the left upper quadrant, similar to prior exam.  IMPRESSION: Moderate to large hiatal hernia.  No acute cardiopulmonary disease.  Original Report Authenticated By: Britta Mccreedy, M.D.   Dg Abd Acute W/chest  10/13/2011  *RADIOLOGY REPORT*  Clinical Data: Abdominal pain.  ACUTE ABDOMEN SERIES (ABDOMEN 2 VIEW & CHEST 1 VIEW)  Comparison: 04/02/2011 CT  Findings: Hiatal hernia.  Surgical clips project over the trachea. Mild bibasilar interstitial prominence / linear opacity may reflect mild atelectasis.  Otherwise, without focal consolidation.  There are numerous surgical clips throughout the abdomen.  There  are a few nonspecific air-fluid levels.  Relative paucity of distal bowel gas.  No free intraperitoneal air.  No acute osseous abnormality.  IMPRESSION: Nonspecific bowel gas pattern with a few air-fluid levels however no bowel dilatation.  No free intraperitoneal air.  Hiatal hernia.  Original Report Authenticated By: Waneta Martins, M.D.   Medications: Scheduled Meds:    . sodium chloride   Intravenous Once  . sodium chloride   Intravenous Once  . calcium-vitamin D  1 tablet Oral Daily  . cholestyramine  1 packet Oral Custom  . ciprofloxacin  400 mg Intravenous Q12H  . enoxaparin  40 mg Subcutaneous Q24H  . Flora-Q  1 capsule Oral Daily  . fluconazole  150 mg Oral Once  . metronidazole  500 mg Intravenous Q8H  . multivitamins ther. w/minerals  1 tablet Oral Daily  . pantoprazole  40 mg Oral Q1200   Continuous Infusions:    . 0.9 % NaCl with KCl 40 mEq / L 125 mL/hr at 11/10/11 2153   PRN Meds:.acetaminophen, acetaminophen, alum & mag hydroxide-simeth, HYDROmorphone, loperamide, oxyCODONE, promethazine, promethazine, zolpidem, DISCONTD: butamben-tetracaine-benzocaine, DISCONTD: fentaNYL, DISCONTD: midazolam  Assessment/Plan: Patient Active Hospital Problem List:  Hypokalemia with hypertension: repleted, on maintenance IV fluids with potassium supplementation   Chronic diarrhea (11/06/2011): C. difficile PCR negative, stool culture is negative. Unclear etiology, probably viral gastroenteritis.  - On Imodium and cholestyramine. - continues to spike fevers with leukocytosis, currently on on IV Cipro and Flagyl empirically. - GI following, the patient underwent EGD today which showed large 6 cm hiatal hernia with possible paraesophageal component. Clinical sigmoidoscopy was normal other than tight anal stenosis on digital exam she is on Imodium as needed. - Patient also underwent a barium swallow esophagogram today which was limited and showed large hiatal hernia. Patient is aware  of the hiatal hernia and states it is chronic and does not want to have any surgeries for it.  ? Crohn's flareup : Patient is on maintenance infliximab injections getting from  Dr Patty Sermons office. This does not look like a typical Crohn's flareup.   Dysphagia (11/06/2011): patient has moderate to large hiatal hernia. For now place on liquid diet per patient's request.   Leukocytosis (11/06/2011): Probably secondary to infectious diarrhea, continue ciprofloxacin and Flagyl.   DVT prophylaxis: Lovenox    LOS: 5 days   RAI,RIPUDEEP 11/11/2011, 3:30 PM

## 2011-11-11 NOTE — Brief Op Note (Signed)
11/06/2011 - 11/11/2011  11:31 AM  PATIENT:  Danielle [name]  65 y.o. female  PRE-OPERATIVE DIAGNOSIS:  EGD-odynophagia FLEX-Diarrhea - unprepped  POST-OPERATIVE DIAGNOSIS:  hiatal hernia anal stenosis,otw normal  PROCEDURE:  Procedure(s): ESOPHAGOGASTRODUODENOSCOPY (EGD) FLEXIBLE SIGMOIDOSCOPY  SURGEON:  Surgeon(s): Barrie Folk, MD  Please see full formal operative notes. EGD showed a large 6 cm hiatal hernia with possible paraesophageal component. Clinical sigmoidoscopy was normal other than tight anal stenosis on digital exam with friability and bleeding

## 2011-11-11 NOTE — Progress Notes (Signed)
[facility] Progress Note  Subjective: Still some nausea and abdominal pain, generalized, no further diarrhea.  Objective: Vital signs in last 24 hours: Temp:  [98.9 F (37.2 C)-100.7 F (38.2 C)] 98.9 F (37.2 C) (12/05 1012) Pulse Rate:  [95-97] 97  (12/05 0608) Resp:  [13-37] 21  (12/05 1125) BP: (74-114)/(51-74) 88/59 mmHg (12/05 1125) SpO2:  [98 %-100 %] 100 % (12/05 1125) Weight:  [74.6 kg (164 lb 7.4 oz)] 164 lb 7.4 oz (74.6 kg) (12/05 0608) Weight change: 0.754 kg (1 lb 10.6 oz)   PE: Abdomen soft nondistended  Lab Results: Results for orders placed during the hospital encounter of 11/06/11 (from the past 24 hour(s))  COMPREHENSIVE METABOLIC PANEL     Status: Abnormal   Collection Time   11/11/11  5:08 AM      Component Value Range   Sodium 135  135 - 145 (mEq/L)   Potassium 3.5  3.5 - 5.1 (mEq/L)   Chloride 114 (*) 96 - 112 (mEq/L)   CO2 12 (*) 19 - 32 (mEq/L)   Glucose, Bld 70  70 - 99 (mg/dL)   BUN 4 (*) 6 - 23 (mg/dL)   Creatinine, Ser 1.61  0.50 - 1.10 (mg/dL)   Calcium 8.0 (*) 8.4 - 10.5 (mg/dL)   Total Protein 6.3  6.0 - 8.3 (g/dL)   Albumin 2.2 (*) 3.5 - 5.2 (g/dL)   AST 14  0 - 37 (U/L)   ALT 11  0 - 35 (U/L)   Alkaline Phosphatase 95  39 - 117 (U/L)   Total Bilirubin 0.2 (*) 0.3 - 1.2 (mg/dL)   GFR calc non Af Amer 57 (*) >90 (mL/min)   GFR calc Af Amer 66 (*) >90 (mL/min)  CBC     Status: Abnormal   Collection Time   11/11/11  5:08 AM      Component Value Range   WBC 13.0 (*) 4.0 - 10.5 (K/uL)   RBC 3.52 (*) 3.87 - 5.11 (MIL/uL)   Hemoglobin 9.3 (*) 12.0 - 15.0 (g/dL)   HCT 09.6 (*) 04.5 - 46.0 (%)   MCV 82.1  78.0 - 100.0 (fL)   MCH 26.4  26.0 - 34.0 (pg)   MCHC 32.2  30.0 - 36.0 (g/dL)   RDW 40.9  81.1 - 91.4 (%)   Platelets 348  150 - 400 (K/uL)    Studies/Results: EGD shows large hiatal hernia with possible paraesophageal component, flexible sigmoidoscopy shows fairly tight anal stenosis and is otherwise normal to 60 cm  the   Assessment: 1. Nausea vomiting abdominal pain unclear whether due to to a true Crohn's exacerbation or possible viral gastroenteritis 2. Very large hiatal hernia 3. 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 No change in Crohn's regimen 2. Continue supportive care and current medicines for now 3. Will obtain barium swallow to assess for paraesophageal hiatal hernia 4.     Oliviya Gilkison C 11/11/2011, 11:34 AM

## 2011-11-12 MED ORDER — FLUCONAZOLE 150 MG PO TABS
150.0000 mg | ORAL_TABLET | Freq: Once | ORAL | Status: AC
  Administered 2011-11-13: 150 mg via ORAL
  Filled 2011-11-12: qty 1

## 2011-11-12 MED ORDER — METRONIDAZOLE IN NACL 5-0.79 MG/ML-% IV SOLN
500.0000 mg | Freq: Three times a day (TID) | INTRAVENOUS | Status: DC
  Administered 2011-11-12 – 2011-11-13 (×3): 500 mg via INTRAVENOUS
  Filled 2011-11-12 (×6): qty 100

## 2011-11-12 MED ORDER — DIPHENHYDRAMINE HCL 25 MG PO CAPS
25.0000 mg | ORAL_CAPSULE | Freq: Four times a day (QID) | ORAL | Status: DC | PRN
  Administered 2011-11-13 (×2): 25 mg via ORAL
  Filled 2011-11-12 (×2): qty 1

## 2011-11-12 NOTE — Progress Notes (Signed)
Subjective:  Feels a lot better today, nausea vomiting, abdominal pain and diarrhea improving  Objective: Weight change: -0.6 kg (-1 lb 5.2 oz)  Intake/Output Summary (Last 24 hours) at 11/12/11 1557 Last data filed at 11/12/11 1514  Gross per 24 hour  Intake   1640 ml  Output      3 ml  Net   1637 ml   Blood pressure 91/61, pulse 91, temperature 99.9 F (37.7 C), temperature source Oral, resp. rate 18, height 5\' 4"  (1.626 m), weight 74 kg (163 lb 2.3 oz), SpO2 99.00%.  On exam Gen: Alert and oriented x3 comfortable, no acute distress HEENT: Anicteric sclera. PERLA CVS : S1-S2 clear, no murmur rubs or gallops  Lungs:clear to auscultation bilaterally no wheezing or rhonchi Abdomen is soft nontender nondistended bowel sounds are heard Extremities no edema, cyanosis or clubbing Neuro: patient is alert oriented x3 no focal deficits  Lab Results: No results found for this or any previous visit (from the past 24 hour(s)).   Micro Results: Recent Results (from the past 240 hour(s))  STOOL CULTURE     Status: Normal   Collection Time   11/07/11 12:59 AM      Component Value Range Status Comment   Specimen Description STOOL   Final    Special Requests NONE   Final    Culture     Final    Value: NO SALMONELLA, SHIGELLA, CAMPYLOBACTER, OR YERSINIA ISOLATED   Report Status 11/10/2011 FINAL   Final   CLOSTRIDIUM DIFFICILE BY PCR     Status: Normal   Collection Time   11/07/11  1:00 AM      Component Value Range Status Comment   C difficile by pcr NEGATIVE  NEGATIVE  Final     Studies/Results: Dg Chest 2 View  11/06/2011  *RADIOLOGY REPORT*  Clinical Data: Cough with increasing sputum.  CHEST - 2 VIEW  Comparison: Chest radiograph 06/05/2010 and 05/28/2010  Findings: Cardiopericardial silhouette is upper normal in size and stable.  Stable thoracic aorta and hilar contours are normal pulmonary vascularity.  Moderate to large hiatal hernia appears similar to prior examination.  The  lungs are normally expanded and clear.  There is no pleural effusion. Bones appear osteopenic.  No acute bony abnormality identified.  Mild gaseous distention of visualized colon in the left upper quadrant, similar to prior exam.  IMPRESSION: Moderate to large hiatal hernia.  No acute cardiopulmonary disease.  Original Report Authenticated By: Britta Mccreedy, M.D.   Dg Abd Acute W/chest  10/13/2011  *RADIOLOGY REPORT*  Clinical Data: Abdominal pain.  ACUTE ABDOMEN SERIES (ABDOMEN 2 VIEW & CHEST 1 VIEW)  Comparison: 04/02/2011 CT  Findings: Hiatal hernia.  Surgical clips project over the trachea. Mild bibasilar interstitial prominence / linear opacity may reflect mild atelectasis.  Otherwise, without focal consolidation.  There are numerous surgical clips throughout the abdomen.  There are a few nonspecific air-fluid levels.  Relative paucity of distal bowel gas.  No free intraperitoneal air.  No acute osseous abnormality.  IMPRESSION: Nonspecific bowel gas pattern with a few air-fluid levels however no bowel dilatation.  No free intraperitoneal air.  Hiatal hernia.  Original Report Authenticated By: Waneta Martins, M.D.   Medications: Scheduled Meds:    . sodium chloride   Intravenous Once  . calcium-vitamin D  1 tablet Oral Daily  . cholestyramine  1 packet Oral Custom  . ciprofloxacin  400 mg Intravenous Q12H  . enoxaparin  40 mg Subcutaneous Q24H  .  Flora-Q  1 capsule Oral Daily  . fluconazole  150 mg Oral Once  . metronidazole  500 mg Intravenous Q8H  . multivitamins ther. w/minerals  1 tablet Oral Daily  . pantoprazole  40 mg Oral Q1200  . DISCONTD: metronidazole  500 mg Intravenous Q8H   Continuous Infusions:    . 0.9 % NaCl with KCl 40 mEq / L 100 mL/hr (11/12/11 1555)   PRN Meds:.acetaminophen, acetaminophen, alum & mag hydroxide-simeth, HYDROmorphone, loperamide, oxyCODONE, promethazine, promethazine, zolpidem  Assessment/Plan: Patient Active Hospital Problem  List:  Hypokalemia with hypotension: Stable  Acute gastroenteritis on Chronic diarrhea (11/06/2011): C. difficile PCR negative, stool culture is negative. Unclear etiology, probably viral gastroenteritis.  - On Imodium and cholestyramine, on IV Cipro and Flagyl empirically. - GI following, the patient underwent EGD which showed large 6 cm hiatal hernia with possible paraesophageal component. Sigmoidoscopy was normal other than tight anal stenosis on digital exam she is on Imodium as needed. - Patient also underwent a barium swallow esophagogram which was limited and showed large hiatal hernia. Patient is aware of the hiatal hernia and states it is chronic and does not want to have any surgeries for it. Started on bland diet, patient tolerating.  ? Crohn's flareup : Patient is on maintenance infliximab injections getting from  Dr Patty Sermons office, does not look like a typical Crohn's flareup.   Dysphagia : patient has moderate to large hiatal hernia. Advanced diet as tolerated  Leukocytosis : Probably secondary to infectious diarrhea, continue ciprofloxacin and Flagyl.   DVT prophylaxis: Lovenox   Disposition: Hopefully DC home in a.m. if okay with gastroenterology and symptoms improved.   LOS: 6 days   Danielle [name] 11/12/2011, 3:57 PM

## 2011-11-12 NOTE — Progress Notes (Signed)
[facility] Progress Note  Subjective: Nausea, abdominal pain, and diarrhea all improved. The patient is hungry.  Objective: Vital signs in last 24 hours: Temp:  [98.7 F (37.1 C)-102.3 F (39.1 C)] 99.2 F (37.3 C) (12/06 0359) Pulse Rate:  [92-105] 92  (12/06 0359) Resp:  [13-37] 20  (12/06 0359) BP: (74-114)/(51-74) 99/65 mmHg (12/06 0359) SpO2:  [96 %-100 %] 98 % (12/06 0359) Weight:  [74 kg (163 lb 2.3 oz)] 163 lb 2.3 oz (74 kg) (12/06 0359) Weight change: -0.6 kg (-1 lb 5.2 oz)   PE: Unchanged  Lab Results: No results found for this or any previous visit (from the past 24 hour(s)).  Studies/Results: Dg Esophagus  11/11/2011  *RADIOLOGY REPORT*  Clinical Data: Large hiatal hernia on endoscopy.  ESOPHOGRAM/BARIUM SWALLOW  Technique:  Combined double contrast and single contrast examination performed using effervescent crystals, thick barium liquid, and thin barium liquid.  Fluoroscopy time:  1.0 minutes.  Comparison:  None.  Findings:  There is pre swallow spillover to the vallecula, with difficulty initiating swallowing.  There are primary peristaltic waves during single swallow evaluation.  Remainder of the exam was limited due to severe patient discomfort.  There is a large hiatal hernia.  IMPRESSION: Exam was limited due to patient condition.  Large hiatal hernia.  Original Report Authenticated By: Reyes Ivan, M.D.      Assessment: 1. Nausea vomiting abdominal pain and diarrhea possibly related to a viral gastroenteritis 2. Crohn's disease 3. Large hiatal hernia, otherwise no visible abnormality on EGD and flexible sigmoidoscopy to 60 cm  Plan: 1. Continue present Crohn's regimen and followup with her primary gastroenterologist after discharge 2. Advance diet as tolerated 3. Continue Protonix which she was artery taking at home    Nashon Erbes C 11/12/2011, 8:19 AM

## 2011-11-13 MED ORDER — METRONIDAZOLE 500 MG PO TABS: 500.0000 mg | ORAL_TABLET | Freq: Three times a day (TID) | ORAL | Status: AC

## 2011-11-13 MED ORDER — NYSTATIN 100000 UNIT/ML MT SUSP: 500000.0000 [IU] | Freq: Four times a day (QID) | OROMUCOSAL | Status: AC

## 2011-11-13 MED ORDER — FLUCONAZOLE 150 MG PO TABS: 150.0000 mg | ORAL_TABLET | Freq: Once | ORAL | Status: AC

## 2011-11-13 MED ORDER — OXYCODONE HCL 5 MG PO TABS: 5.0000 mg | ORAL_TABLET | Freq: Three times a day (TID) | ORAL | Status: AC | PRN

## 2011-11-13 MED ORDER — CIPROFLOXACIN HCL 500 MG PO TABS: 500.0000 mg | ORAL_TABLET | Freq: Two times a day (BID) | ORAL | Status: DC

## 2011-11-13 MED ORDER — LOPERAMIDE HCL 2 MG PO CAPS: 2.0000 mg | ORAL_CAPSULE | ORAL | Status: AC | PRN

## 2011-11-13 NOTE — Progress Notes (Signed)
   CARE MANAGEMENT NOTE 11/13/2011  Patient:  Danielle [name], Danielle [name]   Account Number:  [id number]  Date Initiated:  11/09/2011  Documentation initiated by:  Danielle [name] Assessment:   65 yr-old female adm with chrohn's flare and hypokalemia; lives with sister, independent PTA.     Action/Plan:   Anticipated DC Date:  11/13/2011   Anticipated DC Plan:  HOME/SELF CARE      DC Planning Services  CM consult      Choice offered to / List presented to:             Status of service:   Medicare Important Message given?   (If response is "NO", the following Medicare IM given date fields will be blank) Date Medicare IM given:   Date Additional Medicare IM given:    Discharge Disposition:  HOME/SELF CARE  Per UR Regulation:  Reviewed for med. necessity/level of care/duration of stay  Comments:  PCP:  Dr. Mirna [name]  Contact:  Danielle [name], sister  [phone] 12/7 spoke w pt, she does not anticipate any dc needs, indep, Danielle Delwyn Scoggin rn,bsn 11/11/11 0950 Danielle Prime RN MSN CCM GI following, barium swallow ordered.  11/09/11 1430 Danielle Mayo RN MSN CCM Pt reports she has a seat in her shower stall and is planning to purchase a hand-held shower.  Has required no services or equipment.  No d/c needs identified.

## 2011-11-13 NOTE — Discharge Summary (Signed)
Physician Discharge Summary  Patient ID: Danielle [name] MRN: [id number] DOB/AGE: [birth year] 65 y.o.  Admit date: 11/06/2011 Discharge date: 11/13/2011  Primary Care Physician:  [name], MD Primary gastroenterologist: Dr. Lionel December, in [place]  Discharge Diagnoses:    .Hypokalemia Acute gastroenteritis on chronic diarrhea, C. difficile negative Questionable Crohn's flareup Large hiatal hernia Leukocytosis likely scheduled to infectious diarrhea  Consults: Gastroenterology Dorena Cookey, M.D.)   Discharge Medications: Current Discharge Medication List    START taking these medications   Details  ciprofloxacin (CIPRO) 500 MG tablet Take 1 tablet (500 mg total) by mouth 2 (two) times daily. Qty: 20 tablet, Refills: 0    fluconazole (DIFLUCAN) 150 MG tablet Take 1 tablet (150 mg total) by mouth once. Qty: 3 tablet, Refills: 0    loperamide (IMODIUM) 2 MG capsule Take 1 capsule (2 mg total) by mouth as needed for diarrhea or loose stools. Qty: 30 capsule, Refills: 1    metroNIDAZOLE (FLAGYL) 500 MG tablet Take 1 tablet (500 mg total) by mouth 3 (three) times daily. Qty: 30 tablet, Refills: 0    nystatin (MYCOSTATIN) 100000 UNIT/ML suspension Take 5 mLs (500,000 Units total) by mouth 4 (four) times daily. Qty: 60 mL, Refills: 0    oxyCODONE (OXY IR/ROXICODONE) 5 MG immediate release tablet Take 1 tablet (5 mg total) by mouth every 8 (eight) hours as needed for pain. Qty: 30 tablet, Refills: 0      CONTINUE these medications which have NOT CHANGED   Details  Calcium-Vitamin D 600-200 MG-UNIT per tablet Take 1 tablet by mouth daily.      cholestyramine (QUESTRAN) 4 G packet Take 1 packet by mouth 2 (two) times daily. Qty: 60 each, Refills: 1   Associated Diagnoses: Diarrhea    Cyanocobalamin (VITAMIN B-12 IJ) Inject as directed every 30 (thirty) days.      dicyclomine (BENTYL) 10 MG/5ML syrup Take 20 mg by mouth 4 (four) times daily -  before  meals and at bedtime.     HYDROcodone-acetaminophen (LORTAB 7.5) 7.5-500 MG per tablet Take 1 Tablet By Mouth 3 Times a Day As Needed Qty: 60 tablet, Refills: 0   Associated Diagnoses: Crohn's    multivitamin (THERAGRAN) per tablet Take 1 tablet by mouth daily.      nystatin-triamcinolone (MYCOLOG II) cream Apply topically 4 (four) times daily.     pantoprazole (PROTONIX) 40 MG tablet Take 40 mg by mouth daily.      Probiotic Product (PROBIOTIC FORMULA PO) Take 1 tablet by mouth daily.     promethazine (PHENERGAN) 25 MG tablet Take 25 mg by mouth every 6 (six) hours as needed. Nausea    inFLIXimab (REMICADE) 100 MG injection Inject 100 mg into the vein every 6 (six) weeks. Last infusion was in October , next infusion 11-26-11    zolpidem (AMBIEN) 10 MG tablet Take 10 mg by mouth as needed. insomnia      STOP taking these medications     cephALEXin (KEFLEX) 500 MG capsule          Brief H and P: For complete details please refer to admission H and P, but in brief patient is a 65 year old female with history of Crohn's disease since 5 presented with complaints of weakness, difficulty swallowing and worsening diarrhea for last one week. Patient reported having difficulty swallowing but denied pain in her throat. She had been given antibiotic therapy of Keflex for dental abscess and had dental work done. She also reported increasing heartburn  and indigestion and decreased appetite. She denied any hematochezia or melena. In ED patient's lab studies showed potassium level of 2.0 and she was referred for admission.  Hospital Course:   Hypokalemia with hypotension: Patient was admitted to the telemetry monitored floor and potassium was replaced aggressively, it improved to 3.5 at the time of discharge.   Acute gastroenteritis on Chronic diarrhea: Likely secondary to viral gastroenteritis, resolved  Stool studies were sent, C. difficile PCR was negative, stool culture was negative.  Patient was started on Imodium and cholestyramine and placed on IV Cipro and Flagyl empirically. Gastroenterology was consulted. Patient underwent EGD which showed large 6 cm hiatal hernia with possible paraesophageal component. Sigmoidoscopy was normal other than tight anal stenosis on digital exam. Patient also underwent a barium swallow esophagogram which was limited and showed large hiatal hernia. Patient is aware of the hiatal hernia and states it is chronic and does not want to have any surgeries for it. She was started on bland diet with which she is tolerating. She will follow up with her primary gastroenterologist, Dr. Karilyn Cota on 11/17/2011.   ? Crohn's flareup : Patient is on maintenance infliximab injections getting from Dr Patty Sermons office, does not look like a typical Crohn's flareup during this admission.   Dysphagia : patient has moderate to large hiatal hernia. Initially placed on liquid diet however now tolerating solid diet.  Leukocytosis : Probably secondary to infectious diarrhea, continue ciprofloxacin and Flagyl.     Day of Discharge BP 90/55  Pulse 94  Temp(Src) 98.5 F (36.9 C) (Oral)  Resp 18  Ht 5\' 4"  (1.626 m)  Wt 74.118 kg (163 lb 6.4 oz)  BMI 28.05 kg/m2  SpO2 96%  Physical Exam: General: Alert and awake oriented x3 not in any acute distress. HEENT: anicteric sclera, pupils reactive to light and accommodation CVS: S1-S2 clear no murmur rubs or gallops Chest: clear to auscultation bilaterally, no wheezing rales or rhonchi Abdomen: soft nontender, nondistended, normal bowel sounds, no organomegaly Extremities: no cyanosis, clubbing or edema noted bilaterally Neuro: Cranial nerves II-XII intact, no focal neurological deficits   The results of significant diagnostics from this hospitalization (including imaging, microbiology, ancillary and laboratory) are listed below for reference.    LAB RESULTS: Basic Metabolic Panel:  Lab 11/11/11 4098 11/10/11 0640  11/06/11 1604  NA 135 137 --  K 3.5 3.9 --  CL 114* 116* --  CO2 12* 13* --  GLUCOSE 70 89 --  BUN 4* 5* --  CREATININE 1.03 1.08 --  CALCIUM 8.0* 8.1* --  MG -- -- 2.0  PHOS -- -- --   Liver Function Tests:  Lab 11/11/11 0508  AST 14  ALT 11  ALKPHOS 95  BILITOT 0.2*  PROT 6.3  ALBUMIN 2.2*     Lab 11/11/11 0508 11/10/11 0640  WBC 13.0* 12.2*  NEUTROABS -- --  HGB 9.3* 9.8*  HCT 28.9* 30.7*  MCV 82.1 --  PLT 348 348   Cardiac Enzymes:  Lab 11/06/11 1604  CKTOTAL 109  CKMB 4.7*  CKMBINDEX --  TROPONINI <0.30    Significant Diagnostic Studies:  Dg Chest 2 View  11/06/2011  *RADIOLOGY REPORT*  Clinical Data: Cough with increasing sputum.  CHEST - 2 VIEW  Comparison: Chest radiograph 06/05/2010 and 05/28/2010  Findings: Cardiopericardial silhouette is upper normal in size and stable.  Stable thoracic aorta and hilar contours are normal pulmonary vascularity.  Moderate to large hiatal hernia appears similar to prior examination.  The lungs are normally  expanded and clear.  There is no pleural effusion. Bones appear osteopenic.  No acute bony abnormality identified.  Mild gaseous distention of visualized colon in the left upper quadrant, similar to prior exam.  IMPRESSION: Moderate to large hiatal hernia.  No acute cardiopulmonary disease.  Original Report Authenticated By: Britta Mccreedy, M.D.     Disposition and Follow-up: Discharge Orders    Future Appointments: Provider: Department: Dept Phone: Center:   11/17/2011 10:30 AM Malissa Hippo, MD [facility] December [phone] None   11/26/2011 10:45 AM [facility] [phone] None     Future Orders Please Complete By Expires   Diet - low sodium heart healthy      Increase activity slowly          DISPOSITION: Home  DIET: Heart healthy  ACTIVITY: As tolerated  DISCHARGE FOLLOW-UP Follow-up Information    Follow up with HILL,GERALD K. Make an appointment in 2 weeks.       Follow up with Malissa Hippo, MD on 11/17/2011. (call earlier  if symptoms worsen)    Contact information:   [address], [address] [place] [address] [phone]          Time spent on Discharge: 45 minutes  Signed: RAI,RIPUDEEP 11/13/2011, 2:46 PM

## 2011-11-13 NOTE — Progress Notes (Signed)
Notified dr. reddy pt c/o itching and redness to vaginal area, new order received for diflucan and benadryl, per md. No s/s of acute distress, no vaginal discharge witness.

## 2011-11-17 ENCOUNTER — Ambulatory Visit (INDEPENDENT_AMBULATORY_CARE_PROVIDER_SITE_OTHER): Payer: Medicare Other | Admitting: Internal Medicine

## 2011-11-17 ENCOUNTER — Encounter (INDEPENDENT_AMBULATORY_CARE_PROVIDER_SITE_OTHER): Payer: Self-pay | Admitting: Internal Medicine

## 2011-11-17 VITALS — BP 110/70 | HR 74 | Temp 98.2°F | Resp 14 | Ht 64.0 in | Wt 157.0 lb

## 2011-11-17 DIAGNOSIS — K5090 Crohn's disease, unspecified, without complications: Secondary | ICD-10-CM

## 2011-11-17 DIAGNOSIS — R439 Unspecified disturbances of smell and taste: Secondary | ICD-10-CM

## 2011-11-17 DIAGNOSIS — E876 Hypokalemia: Secondary | ICD-10-CM

## 2011-11-17 DIAGNOSIS — R432 Parageusia: Secondary | ICD-10-CM

## 2011-11-17 DIAGNOSIS — K219 Gastro-esophageal reflux disease without esophagitis: Secondary | ICD-10-CM

## 2011-11-17 NOTE — Patient Instructions (Signed)
Discontinue Cipro but finished up on metronidazole prescription. To have metabolic 7 on 11/26/2011 at the time of Remicade infusion.

## 2011-11-19 NOTE — Progress Notes (Signed)
Presenting complaint; Followup for Crohn's disease. Subjective: Danielle [name] a 65 year old African female with a 30 year history of Crohn's disease who developed copious diarrhea and dysphagia/odynophagia and was admitted to [facility] on 11/06/2011 through 11/13/2019. He had serum potassium of 2 mEq per liter admission and treated aggressively. Her stool studies came back negative. She was seen by Dr. Dorena Cookey of [facility] physicians. She underwent EGD and noted to have large hiatal hernia but no evidence of esophagitis ring or stricture. She also had flexible sigmoidoscopy which revealed perianal inflammation no evidence of proctitis or sigmoid disease. She was discharged on antibiotics. Patient recalls that she had tooth abscess and had her tooth pulled and was treated with cephalexin before she got sick. As noted above her C. difficile by PCR was negative. She feels much better. She is having 2-3 stools per day either soft or mushy. She denies melena or rectal bleeding. She is also not having any rectal discharge or abdominal pain. She does complain of swelling to her upper lip and has no taste whatsoever. She denies shortness of breath or dysphagia. She states her heartburn is well controlled with therapy. Patient states her her arthralgias she has resolved following last infusion of Remicade. Current Medications: Current Outpatient Prescriptions on File Prior to Visit  Medication Sig Dispense Refill  . Calcium-Vitamin D 600-200 MG-UNIT per tablet Take 1 tablet by mouth daily.        . Cyanocobalamin (VITAMIN B-12 IJ) Inject as directed every 30 (thirty) days.        [name] dicyclomine (BENTYL) 10 MG/5ML syrup Take 20 mg by mouth 4 (four) times daily -  before meals and at bedtime.       [name] HYDROcodone-acetaminophen (LORTAB 7.5) 7.5-500 MG per tablet Take 1 Tablet By Mouth 3 Times a Day As Needed  60 tablet  0  . loperamide (IMODIUM) 2 MG capsule Take 1 capsule (2 mg total) by mouth as needed for diarrhea or loose  stools.  30 capsule  1  . metroNIDAZOLE (FLAGYL) 500 MG tablet Take 1 tablet (500 mg total) by mouth 3 (three) times daily.  30 tablet  0  . multivitamin (THERAGRAN) per tablet Take 1 tablet by mouth daily.        [name] nystatin (MYCOSTATIN) 100000 UNIT/ML suspension Take 5 mLs (500,000 Units total) by mouth 4 (four) times daily.  60 mL  0  . nystatin-triamcinolone (MYCOLOG II) cream Apply topically 4 (four) times daily.       . pantoprazole (PROTONIX) 40 MG tablet Take 40 mg by mouth daily.        . Probiotic Product (PROBIOTIC FORMULA PO) Take 1 tablet by mouth daily.       . promethazine (PHENERGAN) 25 MG tablet Take 25 mg by mouth every 6 (six) hours as needed. Nausea      . zolpidem (AMBIEN) 10 MG tablet Take 10 mg by mouth as needed. insomnia      . cholestyramine (QUESTRAN) 4 G packet Take 1 packet by mouth 2 (two) times daily.  60 each  1  . inFLIXimab (REMICADE) 100 MG injection Inject 100 mg into the vein every 6 (six) weeks. Last infusion was in October , next infusion 11-26-11      . oxyCODONE (OXY IR/ROXICODONE) 5 MG immediate release tablet Take 1 tablet (5 mg total) by mouth every 8 (eight) hours as needed for pain.  30 tablet  0   Current Facility-Administered Medications on File Prior to Visit  Medication Dose Route Frequency Provider Last Rate Last Dose  . loratadine (CLARITIN) tablet 10 mg  10 mg Oral Daily Malissa Hippo, MD   10 mg at 10/21/11 1345  . sodium chloride 0.9 % injection 10 mL  10 mL Intravenous PRN Malissa Hippo, MD   10 mL at 10/21/11 1324   Objective; BP 110/70  Pulse 74  Temp(Src) 98.2 F (36.8 C) (Oral)  Resp 14  Ht 5\' 4"  (1.626 m)  Wt 157 lb (71.215 kg)  BMI 26.95 kg/m2 Patient appears to be in no acute distress. Conjunctiva is pink. Sclera is nonicteric Tongue surface is flat but no erosions or ulcers noted. Her initial mucosa is normal. No neck masses or thyromegaly noted. Cardiac exam with regular rhythm normal S1 and S2. No murmur or gallop  noted. Lungs are clear to auscultation. Abdomen is soft and nontender without organomegaly or masses. No LE edema or clubbing noted.  Assessment: #1. Recent bout with copious diarrhea possibly related to use of cephalexin however C. difficile by PCR was negative. #2. Recent bout of severe hypokalemia secondary to diarrhea. Serum potassium was low normal at the time of discharge last week. #3. Dysgeusia. Secondary to antibiotic therapy. She remains on parenteral B12 and oral MVI.   Plan: Patient advised to discontinue Cipro finished up on metronidazole and Diflucan. She will have metabolic 7 on 11/26/2011 when she is at hospital for Remicade infusion. Office visit in 2 months.

## 2011-11-24 ENCOUNTER — Encounter (HOSPITAL_COMMUNITY): Payer: Self-pay | Admitting: Gastroenterology

## 2011-11-24 NOTE — Progress Notes (Signed)
Tolerated infusion without complications. 

## 2011-11-26 ENCOUNTER — Other Ambulatory Visit (INDEPENDENT_AMBULATORY_CARE_PROVIDER_SITE_OTHER): Payer: Self-pay | Admitting: Internal Medicine

## 2011-11-26 ENCOUNTER — Encounter (HOSPITAL_COMMUNITY): Payer: Medicare Other

## 2011-11-26 ENCOUNTER — Encounter (HOSPITAL_COMMUNITY): Payer: Medicare Other | Attending: Oncology

## 2011-11-26 VITALS — BP 95/70 | HR 122 | Temp 97.0°F | Wt 154.0 lb

## 2011-11-26 DIAGNOSIS — K5090 Crohn's disease, unspecified, without complications: Secondary | ICD-10-CM | POA: Insufficient documentation

## 2011-11-26 DIAGNOSIS — E876 Hypokalemia: Secondary | ICD-10-CM

## 2011-11-26 LAB — BASIC METABOLIC PANEL
Calcium: 9.6 mg/dL (ref 8.4–10.5)
GFR calc Af Amer: 46 mL/min — ABNORMAL LOW (ref >90)
GFR calc non Af Amer: 40 mL/min — ABNORMAL LOW (ref >90)
Potassium: 3.1 mEq/L — ABNORMAL LOW (ref 3.5–5.1)
Sodium: 135 mEq/L (ref 135–145)

## 2011-11-26 MED ORDER — SPIRONOLACTONE 50 MG PO TABS: 50.0000 mg | ORAL_TABLET | Freq: Every day | ORAL | Status: DC

## 2011-11-26 MED ORDER — SODIUM CHLORIDE 0.9 % IJ SOLN
10.0000 mL | INTRAMUSCULAR | Status: DC | PRN
  Administered 2011-11-26: 10 mL via INTRAVENOUS

## 2011-11-26 MED ORDER — ACETAMINOPHEN 325 MG PO TABS
650.0000 mg | ORAL_TABLET | ORAL | Status: AC
  Administered 2011-11-26: 325 mg via ORAL
  Filled 2011-11-26: qty 2

## 2011-11-26 MED ORDER — SODIUM CHLORIDE 0.9 % IV SOLN
5.0000 mg/kg | Freq: Once | INTRAVENOUS | Status: AC
  Administered 2011-11-26: 300 mg via INTRAVENOUS
  Filled 2011-11-26: qty 30

## 2011-11-26 MED ORDER — SODIUM CHLORIDE 0.9 % IV SOLN
INTRAVENOUS | Status: DC
  Administered 2011-11-26: 11:00:00 via INTRAVENOUS

## 2011-11-26 MED ORDER — LORATADINE 10 MG PO TABS
10.0000 mg | ORAL_TABLET | Freq: Every day | ORAL | Status: DC
  Administered 2011-11-26: 10 mg via ORAL

## 2011-11-26 NOTE — Progress Notes (Signed)
Tolerated well

## 2011-11-26 NOTE — Progress Notes (Signed)
Labs drawn today for bmp 

## 2011-11-29 NOTE — Progress Notes (Signed)
She'll have a metabolic 7 and she sees Dr. Loleta Chance next week. We may be able to decrease dose of KCl with spironolactone.

## 2011-12-08 HISTORY — PX: COLONOSCOPY: SHX174

## 2011-12-09 ENCOUNTER — Telehealth (INDEPENDENT_AMBULATORY_CARE_PROVIDER_SITE_OTHER): Payer: Self-pay | Admitting: *Deleted

## 2011-12-09 NOTE — Telephone Encounter (Signed)
Patient LM asking for [name] to please give her a call at [phone].

## 2011-12-10 ENCOUNTER — Inpatient Hospital Stay (HOSPITAL_COMMUNITY)
Admission: EM | Admit: 2011-12-10 | Discharge: 2011-12-14 | DRG: 389 | Disposition: A | Discharge status: Home / Self Care | Payer: Medicare Other | Attending: Internal Medicine | Admitting: Internal Medicine

## 2011-12-10 ENCOUNTER — Emergency Department (HOSPITAL_COMMUNITY): Payer: Medicare Other

## 2011-12-10 ENCOUNTER — Encounter (HOSPITAL_COMMUNITY): Payer: Self-pay | Admitting: Emergency Medicine

## 2011-12-10 ENCOUNTER — Other Ambulatory Visit: Payer: Self-pay

## 2011-12-10 DIAGNOSIS — K5090 Crohn's disease, unspecified, without complications: Secondary | ICD-10-CM | POA: Diagnosis present

## 2011-12-10 DIAGNOSIS — E876 Hypokalemia: Secondary | ICD-10-CM | POA: Diagnosis present

## 2011-12-10 DIAGNOSIS — K449 Diaphragmatic hernia without obstruction or gangrene: Secondary | ICD-10-CM | POA: Diagnosis present

## 2011-12-10 DIAGNOSIS — K219 Gastro-esophageal reflux disease without esophagitis: Secondary | ICD-10-CM | POA: Diagnosis present

## 2011-12-10 DIAGNOSIS — I959 Hypotension, unspecified: Secondary | ICD-10-CM | POA: Diagnosis present

## 2011-12-10 DIAGNOSIS — R112 Nausea with vomiting, unspecified: Secondary | ICD-10-CM | POA: Diagnosis not present

## 2011-12-10 DIAGNOSIS — E871 Hypo-osmolality and hyponatremia: Secondary | ICD-10-CM | POA: Diagnosis not present

## 2011-12-10 DIAGNOSIS — R1110 Vomiting, unspecified: Secondary | ICD-10-CM | POA: Diagnosis not present

## 2011-12-10 DIAGNOSIS — R197 Diarrhea, unspecified: Secondary | ICD-10-CM | POA: Diagnosis not present

## 2011-12-10 DIAGNOSIS — K5289 Other specified noninfective gastroenteritis and colitis: Secondary | ICD-10-CM | POA: Diagnosis present

## 2011-12-10 DIAGNOSIS — R141 Gas pain: Secondary | ICD-10-CM | POA: Diagnosis not present

## 2011-12-10 DIAGNOSIS — R109 Unspecified abdominal pain: Secondary | ICD-10-CM | POA: Diagnosis not present

## 2011-12-10 DIAGNOSIS — N390 Urinary tract infection, site not specified: Secondary | ICD-10-CM | POA: Diagnosis present

## 2011-12-10 DIAGNOSIS — K6389 Other specified diseases of intestine: Secondary | ICD-10-CM | POA: Diagnosis not present

## 2011-12-10 DIAGNOSIS — K56609 Unspecified intestinal obstruction, unspecified as to partial versus complete obstruction: Principal | ICD-10-CM | POA: Diagnosis present

## 2011-12-10 DIAGNOSIS — R143 Flatulence: Secondary | ICD-10-CM | POA: Diagnosis not present

## 2011-12-10 DIAGNOSIS — R5381 Other malaise: Secondary | ICD-10-CM | POA: Diagnosis not present

## 2011-12-10 DIAGNOSIS — R7301 Impaired fasting glucose: Secondary | ICD-10-CM | POA: Diagnosis not present

## 2011-12-10 DIAGNOSIS — D72829 Elevated white blood cell count, unspecified: Secondary | ICD-10-CM | POA: Diagnosis present

## 2011-12-10 DIAGNOSIS — R531 Weakness: Secondary | ICD-10-CM

## 2011-12-10 HISTORY — DX: Diaphragmatic hernia without obstruction or gangrene: K44.9

## 2011-12-10 LAB — CBC
HCT: 35.2 % — ABNORMAL LOW (ref 36.0–46.0)
Hemoglobin: 12.8 g/dL (ref 12.0–15.0)
MCH: 26.6 pg (ref 26.0–34.0)
MCHC: 34 g/dL (ref 30.0–36.0)
MCHC: 33.8 g/dL (ref 30.0–36.0)
MCV: 78.4 fL (ref 78.0–100.0)
MCV: 78.4 fL (ref 78.0–100.0)
Platelets: 402 10*3/uL — ABNORMAL HIGH (ref 150–400)
RBC: 4.81 MIL/uL (ref 3.87–5.11)
RDW: 16 % — ABNORMAL HIGH (ref 11.5–15.5)

## 2011-12-10 LAB — COMPREHENSIVE METABOLIC PANEL
ALT: 81 U/L — ABNORMAL HIGH (ref 0–35)
CO2: 9 mEq/L — CL (ref 19–32)
Calcium: 8.5 mg/dL (ref 8.4–10.5)
Creatinine, Ser: 1.39 mg/dL — ABNORMAL HIGH (ref 0.50–1.10)
GFR calc Af Amer: 45 mL/min — ABNORMAL LOW (ref >90)
GFR calc non Af Amer: 39 mL/min — ABNORMAL LOW (ref >90)
Glucose, Bld: 88 mg/dL (ref 70–99)

## 2011-12-10 LAB — DIFFERENTIAL
Basophils Absolute: 0 10*3/uL (ref 0.0–0.1)
Lymphocytes Relative: 24 % (ref 12–46)
Monocytes Absolute: 1.2 10*3/uL — ABNORMAL HIGH (ref 0.1–1.0)
Neutro Abs: 9.4 10*3/uL — ABNORMAL HIGH (ref 1.7–7.7)

## 2011-12-10 LAB — URINALYSIS, ROUTINE W REFLEX MICROSCOPIC
Nitrite: NEGATIVE
Specific Gravity, Urine: 1.008 (ref 1.005–1.030)
pH: 6.5 (ref 5.0–8.0)

## 2011-12-10 LAB — URINE MICROSCOPIC-ADD ON

## 2011-12-10 MED ORDER — FENTANYL CITRATE 0.05 MG/ML IJ SOLN
100.0000 ug | Freq: Once | INTRAMUSCULAR | Status: AC
  Administered 2011-12-10: 100 ug via INTRAVENOUS

## 2011-12-10 MED ORDER — POTASSIUM CHLORIDE 10 MEQ/100ML IV SOLN
10.0000 meq | INTRAVENOUS | Status: AC
  Administered 2011-12-10 – 2011-12-11 (×3): 10 meq via INTRAVENOUS
  Filled 2011-12-10 (×3): qty 100

## 2011-12-10 MED ORDER — DICYCLOMINE HCL 10 MG/5ML PO SOLN
20.0000 mg | Freq: Three times a day (TID) | ORAL | Status: DC
  Administered 2011-12-11 – 2011-12-13 (×4): 20 mg via ORAL
  Filled 2011-12-10 (×20): qty 10

## 2011-12-10 MED ORDER — ENOXAPARIN SODIUM 40 MG/0.4ML ~~LOC~~ SOLN
40.0000 mg | SUBCUTANEOUS | Status: DC
  Administered 2011-12-10 – 2011-12-13 (×4): 40 mg via SUBCUTANEOUS
  Filled 2011-12-10 (×7): qty 0.4

## 2011-12-10 MED ORDER — IOHEXOL 300 MG/ML  SOLN
80.0000 mL | Freq: Once | INTRAMUSCULAR | Status: AC | PRN
  Administered 2011-12-10: 80 mL via INTRAVENOUS

## 2011-12-10 MED ORDER — ZOLPIDEM TARTRATE 5 MG PO TABS
10.0000 mg | ORAL_TABLET | ORAL | Status: DC | PRN
  Administered 2011-12-11 – 2011-12-13 (×3): 10 mg via ORAL
  Filled 2011-12-10: qty 1
  Filled 2011-12-10: qty 2
  Filled 2011-12-10: qty 1
  Filled 2011-12-10: qty 2

## 2011-12-10 MED ORDER — FENTANYL CITRATE 0.05 MG/ML IJ SOLN
INTRAMUSCULAR | Status: AC
  Filled 2011-12-10: qty 2

## 2011-12-10 MED ORDER — SODIUM CHLORIDE 0.9 % IV SOLN
INTRAVENOUS | Status: DC
  Administered 2011-12-10 – 2011-12-11 (×2): via INTRAVENOUS

## 2011-12-10 MED ORDER — ONDANSETRON HCL 4 MG/2ML IJ SOLN
4.0000 mg | Freq: Four times a day (QID) | INTRAMUSCULAR | Status: DC | PRN
  Filled 2011-12-10: qty 2

## 2011-12-10 MED ORDER — POTASSIUM CHLORIDE CRYS ER 20 MEQ PO TBCR
40.0000 meq | EXTENDED_RELEASE_TABLET | Freq: Once | ORAL | Status: AC
  Administered 2011-12-10: 40 meq via ORAL
  Filled 2011-12-10: qty 2

## 2011-12-10 MED ORDER — SACCHAROMYCES BOULARDII 250 MG PO CAPS
250.0000 mg | ORAL_CAPSULE | Freq: Two times a day (BID) | ORAL | Status: DC
  Administered 2011-12-11 – 2011-12-14 (×5): 250 mg via ORAL
  Filled 2011-12-10 (×11): qty 1

## 2011-12-10 MED ORDER — SPIRONOLACTONE 50 MG PO TABS
50.0000 mg | ORAL_TABLET | Freq: Every day | ORAL | Status: DC
  Administered 2011-12-11: 50 mg via ORAL
  Filled 2011-12-10 (×3): qty 1

## 2011-12-10 MED ORDER — PANTOPRAZOLE SODIUM 40 MG IV SOLR
40.0000 mg | INTRAVENOUS | Status: DC
  Administered 2011-12-10 – 2011-12-11 (×2): 40 mg via INTRAVENOUS
  Filled 2011-12-10 (×3): qty 40

## 2011-12-10 MED ORDER — ONDANSETRON HCL 4 MG PO TABS: 4.0000 mg | ORAL_TABLET | Freq: Four times a day (QID) | ORAL | Status: DC | PRN

## 2011-12-10 MED ORDER — CALCIUM-VITAMIN D 600-200 MG-UNIT PO TABS: 1.0000 | ORAL_TABLET | Freq: Every day | ORAL | Status: DC

## 2011-12-10 MED ORDER — PROMETHAZINE HCL 25 MG/ML IJ SOLN
12.5000 mg | Freq: Once | INTRAMUSCULAR | Status: AC
  Administered 2011-12-10: 12.5 mg via INTRAVENOUS
  Filled 2011-12-10: qty 1

## 2011-12-10 MED ORDER — MULTIVITAMINS PO TABS: 1.0000 | ORAL_TABLET | Freq: Every day | ORAL | Status: DC

## 2011-12-10 MED ORDER — DEXTROSE 5 % IV SOLN
1.0000 g | INTRAVENOUS | Status: DC
  Administered 2011-12-10: 1 g via INTRAVENOUS
  Filled 2011-12-10 (×2): qty 10

## 2011-12-10 MED ORDER — ADULT MULTIVITAMIN W/MINERALS CH
1.0000 | ORAL_TABLET | Freq: Every day | ORAL | Status: DC
  Administered 2011-12-12 – 2011-12-14 (×3): 1 via ORAL
  Filled 2011-12-10 (×5): qty 1

## 2011-12-10 MED ORDER — SODIUM CHLORIDE 0.9 % IV BOLUS (SEPSIS)
500.0000 mL | Freq: Once | INTRAVENOUS | Status: AC
  Administered 2011-12-10: 500 mL via INTRAVENOUS

## 2011-12-10 MED ORDER — CALCIUM CARBONATE-VITAMIN D 500-200 MG-UNIT PO TABS
1.0000 | ORAL_TABLET | Freq: Every day | ORAL | Status: DC
  Administered 2011-12-12 – 2011-12-14 (×3): 1 via ORAL
  Filled 2011-12-10 (×6): qty 1

## 2011-12-10 MED ORDER — FENTANYL CITRATE 0.05 MG/ML IJ SOLN
50.0000 ug | INTRAMUSCULAR | Status: DC | PRN
  Administered 2011-12-10 – 2011-12-14 (×8): 50 ug via INTRAVENOUS
  Filled 2011-12-10 (×9): qty 2

## 2011-12-10 MED ORDER — PROMETHAZINE HCL 25 MG/ML IJ SOLN
12.5000 mg | Freq: Four times a day (QID) | INTRAMUSCULAR | Status: DC | PRN
  Administered 2011-12-11 (×2): 12.5 mg via INTRAVENOUS
  Administered 2011-12-11: 17:00:00 via INTRAVENOUS
  Administered 2011-12-12 – 2011-12-14 (×5): 12.5 mg via INTRAVENOUS
  Filled 2011-12-10 (×10): qty 1

## 2011-12-10 NOTE — ED Provider Notes (Cosign Needed)
History     CSN: [id number]  Arrival date & time 12/10/11  1428   First MD Initiated Contact with Patient 12/10/11 1543      Chief Complaint  Patient presents with  . Emesis    (Consider location/radiation/quality/duration/timing/severity/associated sxs/prior treatment) HPI Danielle [name] is a 65 y.o. female presents with c/o nausea and spitting up mucus from the stomach leading to desire to be assessed in the ED. The sx(s) have been present for 5 days. Additional concerns are abdominal pain. Causative factors are none. Palliative factors are nothing. The distress associated is mild. The disorder has been present for 5 days. She also has generalized weakness. No fever or urinary symptoms, back pain, or gait disability      Past Medical History  Diagnosis Date  . Crohn's disease   . Chronic diarrhea   . Joint pain   . GERD (gastroesophageal reflux disease)   . Polyarthralgia   . Shortness of breath   . Pneumonia   . Anemia   . Hernia (acquired) (recurrent)     hiatal    Past Surgical History  Procedure Date  . Bowel resection   . Colostomy     [name]  . Anal stenosis   . Colonoscopy 2000  . Cholecystectomy   . Appendectomy   . Goiter 1999  . Esophagogastroduodenoscopy 11/11/2011    Procedure: ESOPHAGOGASTRODUODENOSCOPY (EGD);  Surgeon: Barrie Folk, MD;  Location: [facility] ENDOSCOPY;  Service: Endoscopy;  Laterality: N/A;  . Flexible sigmoidoscopy 11/11/2011    Procedure: FLEXIBLE SIGMOIDOSCOPY;  Surgeon: Barrie Folk, MD;  Location: [facility] ENDOSCOPY;  Service: Endoscopy;  Laterality: N/A;    Family History  Problem Relation Age of Onset  . Diabetes Mother   . Healthy Sister   . Hypertension Brother   . Colon cancer Brother     History  Substance Use Topics  . Smoking status: Former Smoker -- 4 years    Types: Cigarettes    Quit date: 10/05/1996  . Smokeless tobacco: Never Used   Comment: 1 pack every two weeks when she smoked  . Alcohol Use: No    OB History     Grav Para Term Preterm Abortions TAB SAB Ect Mult Living                  Review of Systems  All other systems reviewed and are negative.    Allergies  Aspirin; Morphine and related; and Sulfa antibiotics  Home Medications   Current Outpatient Rx  Name Route Sig Dispense Refill  . CALCIUM-VITAMIN D 600-200 MG-UNIT PO TABS Oral Take 1 tablet by mouth daily.      [name] VITAMIN B-12 IJ Injection Inject as directed every 30 (thirty) days.      [name] DICYCLOMINE HCL 10 MG/5ML PO SOLN Oral Take 20 mg by mouth 4 (four) times daily -  before meals and at bedtime.     [name] HYDROCODONE-ACETAMINOPHEN 7.5-500 MG PO TABS  Take 1 Tablet By Mouth 3 Times a Day As Needed 60 tablet 0  . INFLIXIMAB 100 MG IV SOLR Intravenous Inject 100 mg into the vein every 6 (six) weeks. Last infusion was in October , next infusion 11-26-11    . MULTIVITAMINS PO TABS Oral Take 1 tablet by mouth daily.      . NYSTATIN-TRIAMCINOLONE 100000-0.1 UNIT/GM-% EX CREA Topical Apply topically 4 (four) times daily.     [name] PANTOPRAZOLE SODIUM 40 MG PO TBEC Oral Take 40 mg by mouth daily.      [name]  PROBIOTIC FORMULA PO Oral Take 1 tablet by mouth daily.     [name] PROMETHAZINE HCL 25 MG PO TABS Oral Take 25 mg by mouth every 6 (six) hours as needed. Nausea    . SPIRONOLACTONE 50 MG PO TABS Oral Take 1 tablet (50 mg total) by mouth daily. 30 tablet 2  . ZOLPIDEM TARTRATE 10 MG PO TABS Oral Take 10 mg by mouth as needed. insomnia      BP 90/56  Pulse 100  Temp(Src) 97.6 F (36.4 C) (Oral)  Resp 20  Ht 5\' 4"  (1.626 m)  Wt 156 lb (70.761 kg)  BMI 26.78 kg/m2  SpO2 100%  Physical Exam  Nursing note and vitals reviewed. Constitutional: She is oriented to person, place, and time. She appears well-developed and well-nourished.  HENT:  Head: Normocephalic and atraumatic.  Eyes: Conjunctivae and EOM are normal. Pupils are equal, round, and reactive to light.  Neck: Normal range of motion and phonation normal. Neck supple.  Cardiovascular:  Normal rate, regular rhythm and intact distal pulses.   Pulmonary/Chest: Effort normal and breath sounds normal. She exhibits no tenderness.  Abdominal: Soft. She exhibits no distension and no mass. There is tenderness (Mild epigastric discomfort to touch). There is no rebound and no guarding.  Musculoskeletal: Normal range of motion. She exhibits no edema and no tenderness.  Neurological: She is alert and oriented to person, place, and time. She has normal strength and normal reflexes. She exhibits normal muscle tone.  Skin: Skin is warm and dry.  Psychiatric: She has a normal mood and affect. Her behavior is normal. Judgment and thought content normal.    ED Course  Procedures (including critical care time) ED treatment: IV fluids, by mouth and IV potassium.    Date: 12/10/2011  Rate: 94  Rhythm: normal sinus rhythm  QRS Axis: normal  Intervals: normal  ST/T Wave abnormalities: early repolarization  Conduction Disutrbances:none  Narrative Interpretation:   Old EKG Reviewed: unchanged  CRITICAL CARE Performed by: Mancel Bale L   Total critical care time: 35  Critical care time was exclusive of separately billable procedures and treating other patients.  Critical care was necessary to treat or prevent imminent or life-threatening deterioration.  Critical care was time spent personally by me on the following activities: development of treatment plan with patient and/or surrogate as well as nursing, discussions with consultants, evaluation of patient's response to treatment, examination of patient, obtaining history from patient or surrogate, ordering and performing treatments and interventions, ordering and review of laboratory studies, ordering and review of radiographic studies, pulse oximetry and re-evaluation of patient's condition.   Labs Reviewed  CBC - Abnormal; Notable for the following:    WBC 14.1 (*)    RDW 15.8 (*)    Platelets 463 (*)    All other components  within normal limits  DIFFERENTIAL - Abnormal; Notable for the following:    Neutro Abs 9.4 (*)    Monocytes Absolute 1.2 (*)    All other components within normal limits  URINALYSIS, ROUTINE W REFLEX MICROSCOPIC - Abnormal; Notable for the following:    APPearance CLOUDY (*)    Hgb urine dipstick SMALL (*)    Protein, ur 30 (*)    Leukocytes, UA LARGE (*)    All other components within normal limits  COMPREHENSIVE METABOLIC PANEL - Abnormal; Notable for the following:    Sodium 132 (*)    Potassium 2.2 (*)    CO2 9 (*)    Creatinine, Ser 1.39 (*)  Total Protein 8.6 (*)    Albumin 3.2 (*)    AST 57 (*)    ALT 81 (*)    Alkaline Phosphatase 119 (*)    Total Bilirubin 0.1 (*)    GFR calc non Af Amer 39 (*)    GFR calc Af Amer 45 (*)    All other components within normal limits  LIPASE, BLOOD - Abnormal; Notable for the following:    Lipase 102 (*)    All other components within normal limits  URINE MICROSCOPIC-ADD ON - Abnormal; Notable for the following:    Squamous Epithelial / LPF FEW (*)    All other components within normal limits  URINE CULTURE   Dg Abd Acute W/chest  12/10/2011  *RADIOLOGY REPORT*  Clinical Data: Abdominal pain and distention, nausea, vomiting, acid reflux, chest discomfort, shortness of breath; history Crohn's disease, GERD  ACUTE ABDOMEN SERIES (ABDOMEN 2 VIEW & CHEST 1 VIEW)  Comparison: 10/13/2011 abdominal radiographs, 11/06/2011 chest radiographs  Findings: Normal heart size and pulmonary vascularity. Mildly tortuous thoracic aorta. Moderate to large hiatal hernia. Lungs clear. No pleural effusion or pneumothorax. Bones appear demineralized. Air filled loops of bowel under left diaphragm similar to prior study. Scattered surgical clips in abdomen. Paucity of colonic gas. Few prominent small bowel loops in mid abdomen with air-fluid levels on upright view. Small amount gas within stomach. Bones diffusely demineralized. No definite bowel wall thickening or  free intraperitoneal air. No urinary tract calcification.  IMPRESSION: Hiatal hernia. Few mildly prominent small bowel loops in the abdomen, with a few scattered air-fluid levels on upright view of paucity of colonic gas. Partial or early small bowel obstruction not excluded. Consider follow-up CT imaging of the abdomen pelvis with IV and oral contrast if clinically indicated.  Original Report Authenticated By: Lollie Marrow, M.D.     1. Weakness   2. Hypokalemia       MDM   Weakness, likely due to severe hypokalemia. Associated multiple metabolic abnormalities. Etiology is not clear. Doubt Crohn's exacerbation since she has only mild, abdominal pain. Diarrhea, that she is having is nonspecific. She is on a potassium sparing diuretic. She'll need to be admitted for stabilization. At this point, no prolonged QT to be concerned about cardiac arrhythmias.      Flint Melter, MD 12/10/11 [phone]

## 2011-12-10 NOTE — H&P (Signed)
History and Physical Examination  Date: 12/10/2011  Patient name: Danielle [name] Medical record number: [id number] Date of birth: 1946-08-16 Age: 66 y.o. Gender: female PCP: [name], MD, MD  Attending physician: Isidor Holts  Chief Complaint  Patient presents with  . Emesis     History of Present Illness: Danielle [name] is an 66 y.o. female with Crohn's disease presented to the emergency department complaining of nausea, emesis, worsening GERD with thick mucus regurgitation from her large hiatal hernia.  She reports abdominal pain in the epigastric area and significant weakness for the past 2 days getting worse.  The patient reports chronic back pain.  She reports no fever or chills or headache.  She reports that she has been taking proton X. But it does not seem to control the acid reflux symptoms that she experiences.  In the emergency department she was found to be hypokalemic and hyponatremic with a urinary tract infection and x-ray studies of the abdomen suspicious for early small bowel obstruction.  The patient has had abdominal surgery in the past.  Hospital admission was requested for treatment, monitoring and evaluation.  Past Medical History Past Medical History  Diagnosis Date  . Crohn's disease   . Chronic diarrhea   . Joint pain   . GERD (gastroesophageal reflux disease)   . Polyarthralgia   . Shortness of breath   . Pneumonia   . Anemia   . Hernia (acquired) (recurrent)     hiatal  . Hiatal hernia   . Back pain     Past Surgical History Past Surgical History  Procedure Date  . Bowel resection   . Colostomy     [name]  . Anal stenosis   . Colonoscopy 2000  . Cholecystectomy   . Appendectomy   . Goiter 1999  . Esophagogastroduodenoscopy 11/11/2011    Procedure: ESOPHAGOGASTRODUODENOSCOPY (EGD);  Surgeon: Barrie Folk, MD;  Location: [facility] ENDOSCOPY;  Service: Endoscopy;  Laterality: N/A;  . Flexible sigmoidoscopy 11/11/2011    Procedure: FLEXIBLE  SIGMOIDOSCOPY;  Surgeon: Barrie Folk, MD;  Location: [facility] ENDOSCOPY;  Service: Endoscopy;  Laterality: N/A;    Home Meds: Prior to Admission medications   Medication Sig Start Date End Date Taking? Authorizing Provider  Calcium-Vitamin D 600-200 MG-UNIT per tablet Take 1 tablet by mouth daily.     Yes Historical Provider, MD  Cyanocobalamin (VITAMIN B-12 IJ) Inject as directed every 30 (thirty) days.     Yes Historical Provider, MD  dicyclomine (BENTYL) 10 MG/5ML syrup Take 20 mg by mouth 4 (four) times daily -  before meals and at bedtime.    Yes Historical Provider, MD  HYDROcodone-acetaminophen (LORTAB 7.5) 7.5-500 MG per tablet Take 1 Tablet By Mouth 3 Times a Day As Needed 07/27/11  Yes Llana Aliment, NP  inFLIXimab (REMICADE) 100 MG injection Inject 100 mg into the vein every 6 (six) weeks. Last infusion was in October , next infusion 11-26-11   Yes Historical Provider, MD  multivitamin [facility]) per tablet Take 1 tablet by mouth daily.     Yes Historical Provider, MD  nystatin-triamcinolone (MYCOLOG II) cream Apply topically 4 (four) times daily.    Yes Historical Provider, MD  pantoprazole (PROTONIX) 40 MG tablet Take 40 mg by mouth daily.     Yes Historical Provider, MD  Probiotic Product (PROBIOTIC FORMULA PO) Take 1 tablet by mouth daily.    Yes Historical Provider, MD  promethazine (PHENERGAN) 25 MG tablet Take 25 mg by mouth every 6 (  six) hours as needed. Nausea   Yes Historical Provider, MD  spironolactone (ALDACTONE) 50 MG tablet Take 1 tablet (50 mg total) by mouth daily. 11/26/11 11/25/12 Yes Malissa Hippo, MD  zolpidem (AMBIEN) 10 MG tablet Take 10 mg by mouth as needed. insomnia 08/24/11  Yes Historical Provider, MD    Allergies: Aspirin; Morphine and related; and Sulfa antibiotics  Social History:  History   Social History  . Marital Status: Single    Spouse Name: N/A    Number of Children: N/A  . Years of Education: N/A   Occupational History  . Not on file.    Social History Main Topics  . Smoking status: Former Smoker -- 4 years    Types: Cigarettes    Quit date: 10/05/1996  . Smokeless tobacco: Never Used   Comment: 1 pack every two weeks when she smoked  . Alcohol Use: No  . Drug Use: No  . Sexually Active: Not on file   Other Topics Concern  . Not on file   Social History Narrative  . No narrative on file   Family History:  Family History  Problem Relation Age of Onset  . Diabetes Mother   . Healthy Sister   . Hypertension Brother   . Colon cancer Brother     Review of Systems: Pertinent items are noted in HPI. All other systems reviewed and reported as negative.   Physical Exam: Blood pressure 90/56, pulse 100, temperature 97.6 F (36.4 C), temperature source Oral, resp. rate 20, height 5\' 4"  (1.626 m), weight 70.761 kg (156 lb), SpO2 100.00%. General appearance: alert, cooperative, appears stated age and no distress Head: Normocephalic, without obvious abnormality, atraumatic Eyes: negative Nose: Nares normal. Septum midline. Mucosa normal. No drainage or sinus tenderness., no discharge Throat: no lesions or gross abnormalities seen Neck: no adenopathy, no carotid bruit, no JVD, supple, symmetrical, trachea midline and thyroid not enlarged, symmetric, no tenderness/mass/nodules Lungs: clear to auscultation bilaterally Heart: S1, S2 normal Abdomen: soft, with mild epigastric tenderness, no masses palpated no guarding or rebound tenderness noted bowel sounds present. Extremities: no pretibial edema cyanosis or clubbing Skin: Skin color, texture, turgor normal. No rashes or lesions Lymph nodes: Cervical, supraclavicular, and axillary nodes normal. Neurologic: Grossly normal  Lab  And Imaging results:  Results for orders placed during the hospital encounter of 12/10/11 (from the past 24 hour(s))  CBC     Status: Abnormal   Collection Time   12/10/11  3:57 PM      Component Value Range   WBC 14.1 (*) 4.0 - 10.5 (K/uL)    RBC 4.81  3.87 - 5.11 (MIL/uL)   Hemoglobin 12.8  12.0 - 15.0 (g/dL)   HCT 19.1  47.8 - 29.5 (%)   MCV 78.4  78.0 - 100.0 (fL)   MCH 26.6  26.0 - 34.0 (pg)   MCHC 34.0  30.0 - 36.0 (g/dL)   RDW 62.1 (*) 30.8 - 15.5 (%)   Platelets 463 (*) 150 - 400 (K/uL)  DIFFERENTIAL     Status: Abnormal   Collection Time   12/10/11  3:57 PM      Component Value Range   Neutrophils Relative 67  43 - 77 (%)   Neutro Abs 9.4 (*) 1.7 - 7.7 (K/uL)   Lymphocytes Relative 24  12 - 46 (%)   Lymphs Abs 3.3  0.7 - 4.0 (K/uL)   Monocytes Relative 9  3 - 12 (%)   Monocytes Absolute 1.2 (*) 0.1 -  1.0 (K/uL)   Eosinophils Relative 1  0 - 5 (%)   Eosinophils Absolute 0.1  0.0 - 0.7 (K/uL)   Basophils Relative 0  0 - 1 (%)   Basophils Absolute 0.0  0.0 - 0.1 (K/uL)  COMPREHENSIVE METABOLIC PANEL     Status: Abnormal   Collection Time   12/10/11  3:57 PM      Component Value Range   Sodium 132 (*) 135 - 145 (mEq/L)   Potassium 2.2 (*) 3.5 - 5.1 (mEq/L)   Chloride 107  96 - 112 (mEq/L)   CO2 9 (*) 19 - 32 (mEq/L)   Glucose, Bld 88  70 - 99 (mg/dL)   BUN 17  6 - 23 (mg/dL)   Creatinine, Ser 1.19 (*) 0.50 - 1.10 (mg/dL)   Calcium 8.5  8.4 - 14.7 (mg/dL)   Total Protein 8.6 (*) 6.0 - 8.3 (g/dL)   Albumin 3.2 (*) 3.5 - 5.2 (g/dL)   AST 57 (*) 0 - 37 (U/L)   ALT 81 (*) 0 - 35 (U/L)   Alkaline Phosphatase 119 (*) 39 - 117 (U/L)   Total Bilirubin 0.1 (*) 0.3 - 1.2 (mg/dL)   GFR calc non Af Amer 39 (*) >90 (mL/min)   GFR calc Af Amer 45 (*) >90 (mL/min)  LIPASE, BLOOD     Status: Abnormal   Collection Time   12/10/11  3:57 PM      Component Value Range   Lipase 102 (*) 11 - 59 (U/L)  URINALYSIS, ROUTINE W REFLEX MICROSCOPIC     Status: Abnormal   Collection Time   12/10/11  5:15 PM      Component Value Range   Color, Urine YELLOW  YELLOW    APPearance CLOUDY (*) CLEAR    Specific Gravity, Urine 1.008  1.005 - 1.030    pH 6.5  5.0 - 8.0    Glucose, UA NEGATIVE  NEGATIVE (mg/dL)   Hgb urine dipstick SMALL  (*) NEGATIVE    Bilirubin Urine NEGATIVE  NEGATIVE    Ketones, ur NEGATIVE  NEGATIVE (mg/dL)   Protein, ur 30 (*) NEGATIVE (mg/dL)   Urobilinogen, UA 0.2  0.0 - 1.0 (mg/dL)   Nitrite NEGATIVE  NEGATIVE    Leukocytes, UA LARGE (*) NEGATIVE   URINE MICROSCOPIC-ADD ON     Status: Abnormal   Collection Time   12/10/11  5:15 PM      Component Value Range   Squamous Epithelial / LPF FEW (*) RARE    WBC, UA 21-50  <3 (WBC/hpf)   Bacteria, UA RARE  RARE    EKG Results:  Orders placed during the hospital encounter of 12/10/11  . EKG 12-LEAD  . EKG 12-LEAD     Impression  Active Problems:  Hypokalemia  Leukocytosis  Crohn's disease  GERD (gastroesophageal reflux disease)  Hyponatremia  Hiatal hernia  UTI (lower urinary tract infection)  Weakness generalized  Back pain  SBO (small bowel obstruction)  Anemia  Elevated LFTs  Renal Insufficiency  Plan  Admit the patient to a telemetry monitored bed.  Provide supplemental fluids with potassium and monitor check magnesium.  Treat urinary tract infection with Rocephin.  I'm ordering a CT scan of the abdomen with contrast to evaluate for small bowel obstruction.  Protonix I.V. Has been ordered.  Keep the patient n.p.o. at this time until her CT scan has been evaluated in her symptoms resolved.  Monitor cycle cardiac enzymes.  Pain medications as needed for back pain and abdominal  pain.  Nausea medications when necessary.  Please see orders.  Please see x-ray studies and labs.  Patient concerned about having to stay in the ER overnight and has requested to go to loses: Hospitalists the bed can be found for her here at this hospital.  I relayed this information to the charge nurse who verbalized understanding. Recheck creatinine after IVFs.  Culture urine.   Standley Dakins MD [facility] [place], [place] [phone] 12/10/2011, 8:18 PM

## 2011-12-10 NOTE — ED Notes (Addendum)
Patient vomiting with diarrhea for 2-3 days, call MD, he thought that her potassium levels might be low, and suggested the patient be seen at emergency room, patient also complains of abdominal pain

## 2011-12-10 NOTE — ED Notes (Signed)
Pt complaining of burning to IV site. Pt made aware that she does not anything infusing through her IV at this time. IV site flushed with approx 300cc NS. No infiltration, redness, or swelling noted to site. Pt refusing to let Rn infuse the rest of Potassium that is scheduled. IV called to see if they can get IV access on pt.

## 2011-12-10 NOTE — Telephone Encounter (Signed)
I spoke with Fulton Mole and she states that her PCP is out of town until next week. She had wondered if Dr. Karilyn Cota might call her in something for the following: weakness, salvia is very thick and white both in her mouth and the back of her throat, and is causing nausea but she is not vomiting.  Disney has decided to go to the Urgent Care there in [place] when her sister gets home for evaluation and will call us back tomorrow with a progress report.

## 2011-12-10 NOTE — ED Notes (Signed)
Still awaiting IV nurse to start PIV so that K can be restarted. Initial site flushed. Pt cont to c/o burning. No swelling, redness, infiltration noted to site. Will cont to monitor

## 2011-12-10 NOTE — Progress Notes (Signed)
Patient ID: Danielle [name], female   DOB: 1946-11-10, 66 y.o.   MRN: [id number]  Transfer Note  12/10/2011   Pt was seen and admitted today from [facility] ER.  Pt was admitted to a telemetry bed but unfortunately there were no telemetry beds available at [facility]. The patient became upset and demanded to be moved to [facility].  I called and spoke with bed control at [facility] and learned that there was a semi-private room available.  The patient said that it was acceptable and that she wanted to proceed with the transfer to [facility].  Please see admission H&P,  labs and imaging for details regarding the patient's hospital admission.  I spoke with the [facility] flow manager and was told to assign patient to [facility] Team 7 Thedore Mins).    Rodney Langton, MD, CDE, FAAFP [facility] [place], [place]

## 2011-12-10 NOTE — ED Notes (Signed)
Pt in x-ray. Will complete EKG upon return.

## 2011-12-11 DIAGNOSIS — R109 Unspecified abdominal pain: Secondary | ICD-10-CM | POA: Diagnosis not present

## 2011-12-11 DIAGNOSIS — N390 Urinary tract infection, site not specified: Secondary | ICD-10-CM | POA: Diagnosis present

## 2011-12-11 DIAGNOSIS — K5090 Crohn's disease, unspecified, without complications: Secondary | ICD-10-CM | POA: Diagnosis not present

## 2011-12-11 DIAGNOSIS — E871 Hypo-osmolality and hyponatremia: Secondary | ICD-10-CM | POA: Diagnosis not present

## 2011-12-11 DIAGNOSIS — R1110 Vomiting, unspecified: Secondary | ICD-10-CM | POA: Diagnosis not present

## 2011-12-11 DIAGNOSIS — R142 Eructation: Secondary | ICD-10-CM | POA: Diagnosis not present

## 2011-12-11 DIAGNOSIS — R112 Nausea with vomiting, unspecified: Secondary | ICD-10-CM

## 2011-12-11 DIAGNOSIS — I959 Hypotension, unspecified: Secondary | ICD-10-CM | POA: Diagnosis present

## 2011-12-11 DIAGNOSIS — R5383 Other fatigue: Secondary | ICD-10-CM | POA: Diagnosis not present

## 2011-12-11 DIAGNOSIS — R5381 Other malaise: Secondary | ICD-10-CM | POA: Diagnosis not present

## 2011-12-11 DIAGNOSIS — R141 Gas pain: Secondary | ICD-10-CM | POA: Diagnosis not present

## 2011-12-11 DIAGNOSIS — K6389 Other specified diseases of intestine: Secondary | ICD-10-CM | POA: Diagnosis not present

## 2011-12-11 DIAGNOSIS — K5289 Other specified noninfective gastroenteritis and colitis: Secondary | ICD-10-CM | POA: Diagnosis present

## 2011-12-11 DIAGNOSIS — K56609 Unspecified intestinal obstruction, unspecified as to partial versus complete obstruction: Secondary | ICD-10-CM | POA: Diagnosis present

## 2011-12-11 DIAGNOSIS — E876 Hypokalemia: Secondary | ICD-10-CM | POA: Diagnosis not present

## 2011-12-11 DIAGNOSIS — K219 Gastro-esophageal reflux disease without esophagitis: Secondary | ICD-10-CM | POA: Diagnosis not present

## 2011-12-11 DIAGNOSIS — D72829 Elevated white blood cell count, unspecified: Secondary | ICD-10-CM | POA: Diagnosis present

## 2011-12-11 DIAGNOSIS — K449 Diaphragmatic hernia without obstruction or gangrene: Secondary | ICD-10-CM | POA: Diagnosis not present

## 2011-12-11 DIAGNOSIS — R197 Diarrhea, unspecified: Secondary | ICD-10-CM | POA: Diagnosis not present

## 2011-12-11 DIAGNOSIS — R1084 Generalized abdominal pain: Secondary | ICD-10-CM | POA: Diagnosis not present

## 2011-12-11 LAB — TSH: TSH: 3.822 u[IU]/mL (ref 0.350–4.500)

## 2011-12-11 LAB — POTASSIUM: Potassium: 2.7 mEq/L — CL (ref 3.5–5.1)

## 2011-12-11 MED ORDER — SODIUM CHLORIDE 0.9 % IV SOLN
INTRAVENOUS | Status: AC
  Administered 2011-12-11: 15:00:00 via INTRAVENOUS

## 2011-12-11 MED ORDER — METHYLPREDNISOLONE SODIUM SUCC 40 MG IJ SOLR
40.0000 mg | Freq: Two times a day (BID) | INTRAMUSCULAR | Status: DC
  Administered 2011-12-11 – 2011-12-12 (×3): 40 mg via INTRAVENOUS
  Filled 2011-12-11 (×6): qty 1

## 2011-12-11 MED ORDER — SODIUM CHLORIDE 0.9 % IJ SOLN
10.0000 mL | INTRAMUSCULAR | Status: DC | PRN
  Administered 2011-12-13 – 2011-12-14 (×4): 10 mL

## 2011-12-11 MED ORDER — CIPROFLOXACIN IN D5W 400 MG/200ML IV SOLN
400.0000 mg | Freq: Two times a day (BID) | INTRAVENOUS | Status: DC
  Administered 2011-12-11 – 2011-12-13 (×5): 400 mg via INTRAVENOUS
  Filled 2011-12-11 (×6): qty 200

## 2011-12-11 MED ORDER — POTASSIUM CHLORIDE CRYS ER 20 MEQ PO TBCR
40.0000 meq | EXTENDED_RELEASE_TABLET | Freq: Two times a day (BID) | ORAL | Status: AC
  Administered 2011-12-11 (×2): 40 meq via ORAL
  Filled 2011-12-11: qty 1
  Filled 2011-12-11: qty 2
  Filled 2011-12-11: qty 1

## 2011-12-11 MED ORDER — POTASSIUM CHLORIDE IN NACL 40-0.9 MEQ/L-% IV SOLN
INTRAVENOUS | Status: DC
  Administered 2011-12-11: 11:00:00 via INTRAVENOUS
  Filled 2011-12-11: qty 1000

## 2011-12-11 MED ORDER — POTASSIUM CHLORIDE 10 MEQ/100ML IV SOLN
10.0000 meq | INTRAVENOUS | Status: AC
  Administered 2011-12-11 (×2): 10 meq via INTRAVENOUS
  Filled 2011-12-11 (×4): qty 100

## 2011-12-11 MED ORDER — MAGNESIUM SULFATE 40 MG/ML IJ SOLN
2.0000 g | Freq: Once | INTRAMUSCULAR | Status: AC
  Administered 2011-12-11: 2 g via INTRAVENOUS
  Filled 2011-12-11: qty 50

## 2011-12-11 MED ORDER — METRONIDAZOLE IN NACL 5-0.79 MG/ML-% IV SOLN
500.0000 mg | Freq: Three times a day (TID) | INTRAVENOUS | Status: DC
  Administered 2011-12-11 – 2011-12-12 (×3): 500 mg via INTRAVENOUS
  Filled 2011-12-11 (×6): qty 100

## 2011-12-11 NOTE — ED Notes (Signed)
Report to [facility] at [employer]

## 2011-12-11 NOTE — Consult Note (Signed)
NAMEGURSIMRAN, LITAKER NO.:  [id number]  MEDICAL RECORD NO.:  [id number]  LOCATION:  4729                         FACILITY:  [facility]  PHYSICIAN:  Ardeth Sportsman, MD     DATE OF BIRTH:  01-21-1946  DATE OF CONSULTATION:  12/11/2011 DATE OF DISCHARGE:                                CONSULTATION   PRIMARY CARE PHYSICIAN:  Annia Friendly. Loleta Chance, MD.  Main gastroenterologist is Dr. Lionel December.  Other gastroenterologist in [place] is Dr. Jeannetta Ellis with [facility].  Prior surgeon is Dr. Erskine Speed, I believe up in [place] through [place].  Requesting physician is [facility].  I think it might have been Dr. Thedore Mins.  SURGEON:  Ardeth Sportsman, MD.  ASSISTANT:  [facility].  REASON FOR EVALUATION:  Abdominal pain, question of sigmoid Crohn's stricture.  HISTORY OF PRESENT ILLNESS:  Ms. Reichardt is a 66 year old obese female with a long-standing history of Crohn's disease.  She has had numerous abdominal surgeries, primarily by Dr. Gabriel Cirri up in [facility] including small bowel resections for it sounds like Crohn's flares and bowel obstructions.  I believe she still has most of her colon.  She has also had history of perianal fistulous disease.  Most recently, she was operated by my partner, Dr. Luisa Hart last summer for some anal stricturing which seems to have improved.  The patient also has known large hiatal hernia as well.  She normally takes proton pump inhibitors for this.  She was admitted around Thanksgiving time, 6 weeks ago with nausea and vomiting.  She normally has 3-5 bowel movements a day and has loose stools.  She occasionally uses Imodium for this but tries not to use it too often.  She had a workup including upper and lower endoscopy which showed no evidence of any strictures or other major abnormalities.  She had a diarrhea workup which I believe was negative for any infection.  She seemed to improve with hydration and  stabilization.  It is presumed she had gastroenteritis with possible Crohn's flare up.  She has been followed up by Dr. Karilyn Cota and has been placed on Remicade.  She notes about a 3-day history of some nausea, vomiting, diarrhea, and feeling rather wiped out.  She went to Urgent Care and sent to emergency room.  She went to [facility].  She was admitted to hospitalist for severe hyperkalemia and hyponatremia.  She had a CAT scan done last night which shows some dilated proximal colon and decompressed distal colon.  Radiology wonders about a possible Crohn's stricture at the junction of the descending and sigmoid colon. Therefore hospitalist requested surgical evaluation.  They say that they are getting Gastroenterology involved as well.  The patient had to be transferred to [facility] due to lack of bed availability at [facility].  Since she has been admitted, her abdominal pain has gone down.  She is feeling much better.  No significant nausea or vomiting now.  No fever, chills, or sweats.  PAST MEDICAL HISTORY: 1. Crohn's disease followed by Dr. Karilyn Cota primarily up in     [place].  Occasionally followed by [facility] when she is in  [place].  Recently with Crohn's flare. 2. Chronic intermittent diarrhea. 3. Anal strictures. 4. Giant hiatal hernia. 5. Question pneumonia in the past. 6. Polyarthralgias and back pain.  PAST SURGICAL HISTORY:  She has had numerous bowel resections primarily done by Dr. Gabriel Cirri.  It looks like primarily in the 1990s and 2000s, none in the past 7 years.  She has had anal stenosis with some anal dilation, most recently done last in June 2011.  She had a repeat EGD done in December 2012 last month which shows no evidence of any strictures or masses up to 60 cm by flex sig and a giant hiatal hernia on EGD.  EGD showed a large hiatal hernia with probable paraesophageal component.  She had an EGD to get it confirmed.  She  had some discomfort associated with it.  ALLERGIES:  ASPIRIN, MORPHINE, and SULFA.  MEDICATIONS:  Calcium, vitamin B12, Bentyl, Lortab, Remicade, Phenergan, Mycolog, Protonix, probiotic, Phenergan, Aldactone, and Ambien.  REVIEW OF SYSTEMS:  As noted per HPI.  GENERAL:  Her weight has been stable.  No fever, chills, or sweats.  EYES, ENT:  Negative.  CARDIAC: Some dyspnea on exertion but no exertional chest pain.  RESPIRATORY:  No recent cold, coughs, or flus.  There is documentation of pneumonia last admission but she denies any difficulty breathing at this point. ABDOMEN:  As noted above.  No hematochezia, melena, or hematemesis.  GU: No dysuria, hematuria, pyuria.  GYN:  No vaginal bleeding or discharge. MUSCULOSKELETAL:  Chronic joint pains but these are stable. HEME/LYMPH/ALLERGIC:  Otherwise negative.  HEPATIC/RENAL/ENDOCRINE: Otherwise negative.  PHYSICAL EXAMINATION:  VITAL SIGNS:  Her temperature is 98, pulse is 87, respirations I would say around 18, systolic blood pressure has been in the 80s-90s/50s-60s.  She has had 4-9 pain, 97% on room air. GENERAL:  She is a well-developed, obese female, slightly thin in the face, calm in no acute distress. HEENT:  Eyes:  Pupils equal, round, reactive to light.  Extraocular movements are intact.  Sclerae nonicteric or injected. LYMPH:  No head, neck, axillary, or groin lymphadenopathy. SKIN:  No major petechiae.  No other source of lesions. PSYCH:  She is calm.  No evidence of any dementia, delirium, psychosis, or paranoia. NEUROLOGICAL:  Cranial nerves II-XII are intact.  Hand grip is 5/5, equal, and symmetrical. NECK:  Supple.  No masses.  Trachea is midline. HEART:  Regular rate and rhythm.  No murmurs, clicks, or rubs. CHEST:  Clear to auscultation bilaterally.  No major wheezes, rales, or rhonchi.  BREASTS:  No obvious nipple discharge or bleeding.  No pain in the rib on sternal compression. ABDOMEN:  Obese but soft.  Not  particularly distended.  She has some mild abdominal discomfort that is diffuse but not at any focal region. She has a midline incision without any evidence of incisional hernia. GENITAL:  No vaginal bleeding and no obvious inguinal hernias. RECTAL:  Deferred given lack of symptoms and she declined. MUSCULOSKELETAL:  Some joint stiffness but pretty good range of motion at shoulders, elbows, wrists, as well as hips, knees, and ankles.  LABORATORY VALUES:  Her potassium is 2.2 with a creatinine of 1.39. LFTs are mildly elevated as well.  White count of 14.1.  Urinalysis shows a large volume of leukocytes.  CT scan again shows a giant hiatal hernia with part of the colon going up into the hiatal hernia.  She does have some proximally dilated colon and distally decompressed.  I am not convinced there is  a definite stricture there.  There is no major phlegmon around it.  Perhaps there is but it does not correlate with normal colonoscopy last month. Dilated small bowel but she obviously has very little small bowel remaining.  No evidence of any free air or hernias.  ASSESSMENT/PLAN:  A 66 year old morbidly obese female with Crohn's disease with numerous abdominal operations, history of partial small bowel obstructions, and chronic diarrhea with nausea, vomiting, and hypokalemia. 1. Admit. 2. IV fluids. 3. Aggressive potassium replacement. 4. Check magnesium. 5. We will follow as a consult.  At this point, I do not see any     evidence of any major bowel obstruction records intervention. 6. Probably touch base with Gastroenterology.  I would be interested     to see what their feeling is on how the CT scan findings correlate     with the endoscopy from a month ago.  I do not know if repeat     endoscopy is warranted unless she has some stronger evidence such     as hematochezia or melena but I defer to them.  Continue Remicade. 7. I wonder if lot of her symptoms are related to her giant      paraesophageal hiatal hernia and would she benefit from considering     surgical repair.  Given her comorbidities and other issues and     numerous prior surgeries, I think that would be higher risk.  I     will defer to my partner Dr. Wenda Low who is on this week for     the service who has experience in this to see if she would benefit     since a lot of her symptoms are nausea, vomiting, pain.     Ardeth Sportsman, MD     SCG/MEDQ  D:  12/11/2011  T:  12/11/2011  Job:  [id number]

## 2011-12-11 NOTE — Consult Note (Signed)
Referring Provider: Dr. Thedore Mins Primary Care Physician:  [name], MD, MD Primary Gastroenterologist:  Dr. Karilyn Cota ([place])  Reason for Consultation:  Crohn's disease; Nausea/Vomiting  HPI: Danielle [name] is a 66 y.o. female with longstanding Crohn's disease and multiple intestinal surgeries in the past with the last occurring about 7 years ago. She has been on Remicade for over a year and usually sees Dr. Karilyn Cota in [place]. She was in the hospital in early December and was seen by Dr. Madilyn Fireman for diarrhea who did a flexible sigmoidoscopy that showed a perianal stenosis and was otherwise unremarkable up to 60cm (the extent of the exam). An EGD showed a large paraesophageal hernia. She reports recent worsening of nausea with eating and vomiting up clear fluid. Denies any associated abdominal pain. Reports being able to keep her food down despite these N/V episodes. Reports that her stools are at their baseline of 3-4 loose stools per day. Denies any diarrhea or rectal bleeding. CT during this admit showed a distal colonic narrowing concerning for an inflammatory stricture. Takes a PPI at home once a day. Last Remicade was in late December per her report.  Past Medical History  Diagnosis Date  . Crohn's disease   . Chronic diarrhea   . Joint pain   . GERD (gastroesophageal reflux disease)   . Polyarthralgia   . Shortness of breath   . Pneumonia   . Anemia   . Hernia (acquired) (recurrent)     hiatal  . Hiatal hernia   . Back pain     Past Surgical History  Procedure Date  . Bowel resection   . Colostomy     [name]  . Anal stenosis   . Colonoscopy 2000  . Cholecystectomy   . Appendectomy   . Goiter 1999  . Esophagogastroduodenoscopy 11/11/2011    Procedure: ESOPHAGOGASTRODUODENOSCOPY (EGD);  Surgeon: Barrie Folk, MD;  Location: [facility] ENDOSCOPY;  Service: Endoscopy;  Laterality: N/A;  . Flexible sigmoidoscopy 11/11/2011    Procedure: FLEXIBLE SIGMOIDOSCOPY;  Surgeon: Barrie Folk, MD;  Location: [facility] ENDOSCOPY;  Service: Endoscopy;  Laterality: N/A;    Prior to Admission medications   Medication Sig Start Date End Date Taking? Authorizing Provider  Calcium-Vitamin D 600-200 MG-UNIT per tablet Take 1 tablet by mouth daily.     Yes Historical Provider, MD  Cyanocobalamin (VITAMIN B-12 IJ) Inject as directed every 30 (thirty) days.     Yes Historical Provider, MD  dicyclomine (BENTYL) 10 MG/5ML syrup Take 20 mg by mouth 4 (four) times daily -  before meals and at bedtime.    Yes Historical Provider, MD  HYDROcodone-acetaminophen (LORTAB 7.5) 7.5-500 MG per tablet Take 1 Tablet By Mouth 3 Times a Day As Needed 07/27/11  Yes Llana Aliment, NP  inFLIXimab (REMICADE) 100 MG injection Inject 100 mg into the vein every 6 (six) weeks. Last infusion was in October , next infusion 11-26-11   Yes Historical Provider, MD  multivitamin [employer]) per tablet Take 1 tablet by mouth daily.     Yes Historical Provider, MD  nystatin-triamcinolone (MYCOLOG II) cream Apply topically 4 (four) times daily.    Yes Historical Provider, MD  pantoprazole (PROTONIX) 40 MG tablet Take 40 mg by mouth daily.     Yes Historical Provider, MD  Probiotic Product (PROBIOTIC FORMULA PO) Take 1 tablet by mouth daily.    Yes Historical Provider, MD  promethazine (PHENERGAN) 25 MG tablet Take 25 mg by mouth every 6 (six) hours as needed. Nausea  Yes Historical Provider, MD  spironolactone (ALDACTONE) 50 MG tablet Take 1 tablet (50 mg total) by mouth daily. 11/26/11 11/25/12 Yes Malissa Hippo, MD  zolpidem (AMBIEN) 10 MG tablet Take 10 mg by mouth as needed. insomnia 08/24/11  Yes Historical Provider, MD    Scheduled Meds:   . calcium-vitamin D  1 tablet Oral Daily  . ciprofloxacin  400 mg Intravenous Q12H  . dicyclomine  20 mg Oral TID AC & HS  . enoxaparin  40 mg Subcutaneous Q24H  . fentaNYL  100 mcg Intravenous Once  . magnesium sulfate 1 - 4 g bolus IVPB  2 g Intravenous Once  .  methylPREDNISolone (SOLU-MEDROL) injection  40 mg Intravenous Q12H  . metronidazole  500 mg Intravenous Q8H  . mulitivitamin with minerals  1 tablet Oral Daily  . pantoprazole (PROTONIX) IV  40 mg Intravenous Q24H  . potassium chloride  10 mEq Intravenous Q1 Hr x 4  . potassium chloride  10 mEq Intravenous Q1 Hr x 4  . potassium chloride  40 mEq Oral Once  . potassium chloride  40 mEq Oral BID  . promethazine  12.5 mg Intravenous Once  . saccharomyces boulardii  250 mg Oral BID  . sodium chloride  500 mL Intravenous Once  . DISCONTD: Calcium-Vitamin D  1 tablet Oral Daily  . DISCONTD: cefTRIAXone (ROCEPHIN)  IV  1 g Intravenous Q24H  . DISCONTD: multivitamin  1 tablet Oral Daily  . DISCONTD: spironolactone  50 mg Oral Daily   Continuous Infusions:   . sodium chloride 125 mL/hr at 12/11/11 1432  . DISCONTD: sodium chloride 125 mL/hr at 12/11/11 1003  . DISCONTD: 0.9 % NaCl with KCl 40 mEq / L 75 mL/hr at 12/11/11 1116   PRN Meds:.fentaNYL, iohexol, promethazine, zolpidem, DISCONTD: ondansetron (ZOFRAN) IV, DISCONTD: ondansetron  Allergies as of 12/10/2011 - Review Complete 12/10/2011  Allergen Reaction Noted  . Aspirin Other (See Comments) 07/22/2011  . Morphine and related Itching 07/22/2011  . Sulfa antibiotics Nausea And Vomiting 11/06/2011    Family History  Problem Relation Age of Onset  . Diabetes Mother   . Healthy Sister   . Hypertension Brother   . Colon cancer Brother     History   Social History  . Marital Status: Single    Spouse Name: N/A    Number of Children: N/A  . Years of Education: N/A   Occupational History  . Not on file.   Social History Main Topics  . Smoking status: Former Smoker -- 4 years    Types: Cigarettes    Quit date: 10/05/1996  . Smokeless tobacco: Never Used   Comment: 1 pack every two weeks when she smoked  . Alcohol Use: No  . Drug Use: No  . Sexually Active: Not on file   Other Topics Concern  . Not on file   Social  History Narrative  . No narrative on file    Review of Systems: Pertinent GI symptoms per HPI; All systems reviewed and reported as negative except as stated above.  Physical Exam: Vital signs: Filed Vitals:   12/11/11 1500  BP: 98/61  Pulse: 74  Temp: 98 F (36.7 C)  Resp: 22   Last BM Date: 12/10/11 General:   Alert,  Well-developed, well-nourished, pleasant and cooperative in NAD HEENT: oral pharynx clear, anicteric Neck: supple, no LAD, nontender Lungs:  Clear throughout to auscultation.   No wheezes, crackles, or rhonchi. No acute distress. Heart:  Regular rate and rhythm;  no murmurs, clicks, rubs,  or gallops. Abdomen: soft, nontender, nondistended, positive bowel sounds, surgical scars noted  Rectal:  Deferred Ext: no edema, no cyanosis  GI:  Lab Results:  Basename 12/10/11 2148 12/10/11 1557  WBC 13.5* 14.1*  HGB 11.9* 12.8  HCT 35.2* 37.7  PLT 402* 463*   BMET  Basename 12/11/11 1036 12/10/11 2148 12/10/11 1557  NA -- -- 132*  K 2.7* -- 2.2*  CL -- -- 107  CO2 -- -- 9*  GLUCOSE -- -- 88  BUN -- -- 17  CREATININE -- 1.22* 1.39*  CALCIUM -- -- 8.5   LFT  Basename 12/10/11 1557  PROT 8.6*  ALBUMIN 3.2*  AST 57*  ALT 81*  ALKPHOS 119*  BILITOT 0.1*  BILIDIR --  IBILI --   PT/INR No results found for this basename: LABPROT:2,INR:2 in the last 72 hours   Studies/Results: Ct Abdomen Pelvis W Contrast  12/10/2011  *RADIOLOGY REPORT*  Clinical Data: Vomiting and diarrhea.  History of Crohn's. Gastroesophageal reflux disease.  Hernia surgery.  Bowel resection. Cholecystectomy and appendectomy.  CT ABDOMEN AND PELVIS WITH CONTRAST  Technique:  Multidetector CT imaging of the abdomen and pelvis was performed following the standard protocol during bolus administration of intravenous contrast.  Contrast: 80mL OMNIPAQUE IOHEXOL 300 MG/ML IV SOLN  Comparison: 04/02/2011  Findings: Slight fibrosis in the lung bases.  Large esophageal hiatal hernia with most of  the stomach in the chest.  Surgical absence of the gallbladder.  The liver, spleen, pancreas, adrenal glands, kidneys, abdominal aorta, and retroperitoneal lymph nodes are unremarkable.  Postsurgical changes in the upper abdomen with bowel staples consistent with bowel resections.  There is colonic dilatation with air-fluid levels in the colon down to the level of the sigmoid descending colon junction where there is focal change in caliber. There is enhancement of the wall in this area.  This suggests an inflammatory stricture although colonic neoplasm is not entirely excluded.  The colon proximal to this area is distended.  Of note, the transverse colon extends into the hiatal hernia and there is some narrowing at the point of the hernia but the colon both before and after the hernia is distended.  I believe the site of obstruction is at the lower descending/sigmoid junction.  There is also mild dilatation of small bowel loops with fluid and gas present.  Focal narrowing of a small bowel loop in the mid abdomen probably representing inflammatory stricture.  This is similar to the changes seen on the prior study except there is no acute inflammatory infiltration in this area today. No evidence of abscess.  No free air or free fluid in the abdomen.  There is focal gas in the subcutaneous tissues over the left lower abdomen.  This could represent injection site.  Otherwise, soft tissue infection is suggested.  Pelvis:  The rectum and sigmoid colon are decompressed.  No obvious inflammatory changes.  The bladder wall is not thickened.  Uterus and adnexal structures are not enlarged.  No free or loculated pelvic fluid collections.  The appendix is surgically absent by history.  There appears to have been a partial right colectomy with ileal colonic anastomoses.  Normal alignment of the lumbar vertebra.  Expansion of the bony spinal canal in the sacrum, likely representing cyst or prominence of the thecal sac.   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  Associated dilatation of the residual small bowel. 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  No intra- abdominal abscess.  Note that there is a large esophageal hiatal hernia containing most of the stomach.  The splenic fracture of the colon also extends up into the hiatal hernia.  Multiple additional findings as discussed.  Findings were discussed by telephone with Dr. Esmeralda Arthur at the time of dictation, 2320 hours on 12/10/2011.  Original Report Authenticated By: Marlon Pel, M.D.   Dg Abd Acute W/chest  12/10/2011  *RADIOLOGY REPORT*  Clinical Data: Abdominal pain and distention, nausea, vomiting, acid reflux, chest discomfort, shortness of breath; history Crohn's disease, GERD  ACUTE ABDOMEN SERIES (ABDOMEN 2 VIEW & CHEST 1 VIEW)  Comparison: 10/13/2011 abdominal radiographs, 11/06/2011 chest radiographs  Findings: Normal heart size and pulmonary vascularity. Mildly tortuous thoracic aorta. Moderate to large hiatal hernia. Lungs clear. No pleural effusion or pneumothorax. Bones appear demineralized. Air filled loops of bowel under left diaphragm similar to prior study. Scattered surgical clips in abdomen. Paucity of colonic gas. Few prominent small bowel loops in mid abdomen with air-fluid levels on upright view. Small amount gas within stomach. Bones diffusely demineralized. No definite bowel wall thickening or free intraperitoneal air. No urinary tract calcification.  IMPRESSION: Hiatal hernia. Few mildly prominent small bowel loops in the abdomen, with a few scattered air-fluid levels on upright view of paucity of colonic gas. Partial or early small bowel obstruction not excluded. Consider follow-up CT imaging of the abdomen pelvis with IV and oral contrast if clinically indicated.   Original Report Authenticated By: Lollie Marrow, M.D.    Impression/Plan: 65yo with long-standing Crohn's disease on maintenance Remicade presenting with N/V without abdominal pain or diarrhea. I think her admitting symptoms are due to her large hiatal hernia and unrelated to her Crohn's disease. She is not having a Crohn's flare. Her CT findings regarding her colon are likely chronic and there was suggestion of the current appearance on the CT in April 2012. I do not think she needs a repeat flex sig based on normal colonic mucosa on 11/11/11. Would advance diet as tolerated. Increase to PPI BID when on solid food. Continue Remicade as outpt. Defer to surgery whether hernia surgery needed but I do not think it is needed right now and would maximize medical therapy first.    LOS: 1 day   Latesia Norrington C.  12/11/2011, 4:23 PM

## 2011-12-11 NOTE — ED Notes (Signed)
Pt requested for IV site to be discontinued

## 2011-12-11 NOTE — ED Notes (Signed)
Pt requested transfer to [facility] for further care.

## 2011-12-11 NOTE — Progress Notes (Signed)
Danielle [name] EAV:[id number] is a 66 y.o. female,  Outpatient Primary MD for the patient is [name], MD, MD  Chief Complaint  Patient presents with  . Emesis        Subjective:   Danielle [name] today has, No headache, No chest pain, No abdominal pain - No Nausea, No new weakness tingling or numbness, No Cough - SOB.    Objective:   Filed Vitals:   12/11/11 0015 12/11/11 0016 12/11/11 0147 12/11/11 0700  BP:  93/60 98/65 87/52   Pulse:  74 96 87  Temp: 97.8 F (36.6 C)  98 F (36.7 C) 98 F (36.7 C)  TempSrc:      Resp:  14 24 22   Height:   5\' 4"  (1.626 m)   Weight:   66.6 kg (146 lb 13.2 oz)   SpO2:  100% 95% 97%    Wt Readings from Last 3 Encounters:  12/11/11 66.6 kg (146 lb 13.2 oz)  11/26/11 69.854 kg (154 lb)  11/17/11 71.215 kg (157 lb)     Intake/Output Summary (Last 24 hours) at 12/11/11 1239 Last data filed at 12/11/11 0800  Gross per 24 hour  Intake    500 ml  Output    400 ml  Net    100 ml    Exam Awake Alert, Oriented *3, No new F.N deficits, Normal affect [place].AT,PERRAL Supple Neck,No JVD, No cervical lymphadenopathy appriciated.  Symmetrical Chest wall movement, Good air movement bilaterally, CTAB RRR,No Gallops,Rubs or new Murmurs, No Parasternal Heave +ve B.Sounds, Abd Soft, Non tender, No organomegaly appriciated, No rebound -guarding or rigidity. No Cyanosis, Clubbing or edema, No new Rash or bruise   Data Review  CBC  Lab 12/10/11 2148 12/10/11 1557  WBC 13.5* 14.1*  HGB 11.9* 12.8  HCT 35.2* 37.7  PLT 402* 463*  MCV 78.4 78.4  MCH 26.5 26.6  MCHC 33.8 34.0  RDW 16.0* 15.8*  LYMPHSABS -- 3.3  MONOABS -- 1.2*  EOSABS -- 0.1  BASOSABS -- 0.0  BANDABS -- --    Chemistries   Lab 12/11/11 1036 12/10/11 2148 12/10/11 1557  NA -- -- 132*  K 2.7* -- 2.2*  CL -- -- 107  CO2 -- -- 9*  GLUCOSE -- -- 88  BUN -- -- 17  CREATININE -- 1.22* 1.39*  CALCIUM -- -- 8.5  MG -- 1.4* --  AST -- -- 57*  ALT -- -- 81*    ALKPHOS -- -- 119*  BILITOT -- -- 0.1*   ------------------------------------------------------------------------------------------------------------------ estimated creatinine clearance is 43.8 ml/min (by C-G formula based on Cr of 1.22). ------------------------------------------------------------------------------------------------------------------ No results found for this basename: HGBA1C:2 in the last 72 hours ------------------------------------------------------------------------------------------------------------------ No results found for this basename: CHOL:2,HDL:2,LDLCALC:2,TRIG:2,CHOLHDL:2,LDLDIRECT:2 in the last 72 hours ------------------------------------------------------------------------------------------------------------------  [facility] 12/10/11 2148  TSH 3.822  T4TOTAL --  T3FREE --  THYROIDAB --   ------------------------------------------------------------------------------------------------------------------ No results found for this basename: VITAMINB12:2,FOLATE:2,FERRITIN:2,TIBC:2,IRON:2,RETICCTPCT:2 in the last 72 hours  Coagulation profile No results found for this basename: INR:5,PROTIME:5 in the last 168 hours  No results found for this basename: DDIMER:2 in the last 72 hours  Cardiac Enzymes No results found for this basename: CK:3,CKMB:3,TROPONINI:3,MYOGLOBIN:3 in the last 168 hours ------------------------------------------------------------------------------------------------------------------ No components found with this basename: POCBNP:3  Micro Results No results found for this or any previous visit (from the past 240 hour(s)).  Radiology Reports Ct Abdomen Pelvis W Contrast  12/10/2011  *RADIOLOGY REPORT*  Clinical Data: Vomiting and diarrhea.  History of Crohn's. Gastroesophageal reflux disease.  Hernia  surgery.  Bowel resection. Cholecystectomy and appendectomy.  CT ABDOMEN AND PELVIS WITH CONTRAST  Technique:  Multidetector CT imaging  of the abdomen and pelvis was performed following the standard protocol during bolus administration of intravenous contrast.  Contrast: 80mL OMNIPAQUE IOHEXOL 300 MG/ML IV SOLN  Comparison: 04/02/2011  Findings: Slight fibrosis in the lung bases.  Large esophageal hiatal hernia with most of the stomach in the chest.  Surgical absence of the gallbladder.  The liver, spleen, pancreas, adrenal glands, kidneys, abdominal aorta, and retroperitoneal lymph nodes are unremarkable.  Postsurgical changes in the upper abdomen with bowel staples consistent with bowel resections.  There is colonic dilatation with air-fluid levels in the colon down to the level of the sigmoid descending colon junction where there is focal change in caliber. There is enhancement of the wall in this area.  This suggests an inflammatory stricture although colonic neoplasm is not entirely excluded.  The colon proximal to this area is distended.  Of note, the transverse colon extends into the hiatal hernia and there is some narrowing at the point of the hernia but the colon both before and after the hernia is distended.  I believe the site of obstruction is at the lower descending/sigmoid junction.  There is also mild dilatation of small bowel loops with fluid and gas present.  Focal narrowing of a small bowel loop in the mid abdomen probably representing inflammatory stricture.  This is similar to the changes seen on the prior study except there is no acute inflammatory infiltration in this area today. No evidence of abscess.  No free air or free fluid in the abdomen.  There is focal gas in the subcutaneous tissues over the left lower abdomen.  This could represent injection site.  Otherwise, soft tissue infection is suggested.  Pelvis:  The rectum and sigmoid colon are decompressed.  No obvious inflammatory changes.  The bladder wall is not thickened.  Uterus and adnexal structures are not enlarged.  No free or loculated pelvic fluid collections.   The appendix is surgically absent by history.  There appears to have been a partial right colectomy with ileal colonic anastomoses.  Normal alignment of the lumbar vertebra.  Expansion of the bony spinal canal in the sacrum, likely representing cyst or prominence of the thecal sac.  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  Associated dilatation of the residual small bowel. 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  No intra- abdominal abscess.  Note that there is a large esophageal hiatal hernia containing most of the stomach.  The splenic fracture of the colon also extends up into the hiatal hernia.  Multiple additional findings as discussed.  Findings were discussed by telephone with Dr. Esmeralda Arthur at the time of dictation, 2320 hours on 12/10/2011.  Original Report Authenticated By: Marlon Pel, M.D.   Dg Esophagus  11/11/2011  *RADIOLOGY REPORT*  Clinical Data: Large hiatal hernia on endoscopy.  ESOPHOGRAM/BARIUM SWALLOW  Technique:  Combined double contrast and single contrast examination performed using effervescent crystals, thick barium liquid, and thin barium liquid.  Fluoroscopy time:  1.0 minutes.  Comparison:  None.  Findings:  There is pre swallow spillover to the vallecula, with difficulty initiating swallowing.  There are primary peristaltic waves during single swallow evaluation.  Remainder of the exam was limited due to severe patient discomfort.  There is a  large hiatal hernia.  IMPRESSION: Exam was limited due to patient condition.  Large hiatal hernia.  Original Report Authenticated By: Reyes Ivan, M.D.   Dg Abd Acute W/chest  12/10/2011  *RADIOLOGY REPORT*  Clinical Data: Abdominal pain and distention, nausea, vomiting, acid reflux, chest discomfort, shortness of breath; history Crohn's disease, GERD   ACUTE ABDOMEN SERIES (ABDOMEN 2 VIEW & CHEST 1 VIEW)  Comparison: 10/13/2011 abdominal radiographs, 11/06/2011 chest radiographs  Findings: Normal heart size and pulmonary vascularity. Mildly tortuous thoracic aorta. Moderate to large hiatal hernia. Lungs clear. No pleural effusion or pneumothorax. Bones appear demineralized. Air filled loops of bowel under left diaphragm similar to prior study. Scattered surgical clips in abdomen. Paucity of colonic gas. Few prominent small bowel loops in mid abdomen with air-fluid levels on upright view. Small amount gas within stomach. Bones diffusely demineralized. No definite bowel wall thickening or free intraperitoneal air. No urinary tract calcification.  IMPRESSION: Hiatal hernia. Few mildly prominent small bowel loops in the abdomen, with a few scattered air-fluid levels on upright view of paucity of colonic gas. Partial or early small bowel obstruction not excluded. Consider follow-up CT imaging of the abdomen pelvis with IV and oral contrast if clinically indicated.  Original Report Authenticated By: Lollie Marrow, M.D.    Scheduled Meds:   . calcium-vitamin D  1 tablet Oral Daily  . cefTRIAXone (ROCEPHIN)  IV  1 g Intravenous Q24H  . ciprofloxacin  400 mg Intravenous Q12H  . dicyclomine  20 mg Oral TID AC & HS  . enoxaparin  40 mg Subcutaneous Q24H  . fentaNYL  100 mcg Intravenous Once  . magnesium sulfate 1 - 4 g bolus IVPB  2 g Intravenous Once  . methylPREDNISolone (SOLU-MEDROL) injection  40 mg Intravenous Q12H  . metronidazole  500 mg Intravenous Q8H  . mulitivitamin with minerals  1 tablet Oral Daily  . pantoprazole (PROTONIX) IV  40 mg Intravenous Q24H  . potassium chloride  10 mEq Intravenous Q1 Hr x 4  . potassium chloride  10 mEq Intravenous Q1 Hr x 4  . potassium chloride  40 mEq Oral Once  . potassium chloride  40 mEq Oral BID  . promethazine  12.5 mg Intravenous Once  . saccharomyces boulardii  250 mg Oral BID  . sodium chloride  500  mL Intravenous Once  . DISCONTD: Calcium-Vitamin D  1 tablet Oral Daily  . DISCONTD: multivitamin  1 tablet Oral Daily  . DISCONTD: spironolactone  50 mg Oral Daily   Continuous Infusions:   . sodium chloride    . DISCONTD: sodium chloride 125 mL/hr at 12/11/11 1003  . DISCONTD: 0.9 % NaCl with KCl 40 mEq / L 75 mL/hr at 12/11/11 1116   PRN Meds:.fentaNYL, iohexol, promethazine, zolpidem, DISCONTD: ondansetron (ZOFRAN) IV, DISCONTD: ondansetron  Assessment & Plan    1. Abd Pain, N&V -SBO with H/O crohn;s for 68yrs -  ? Flare vs Chr stricture - symptoms better, NPO, IVF, BP chronically low, GI to see-call placed with [facility]. Continue IV steroids-cipro-flagyl for now.  2.Low K and Na - replace IV and PO along with mag, recheck in pm.  3. UTI - DC rocephin, as on ABX for #1.  4. Elevated LFTs - monitor, GI to follow.  5.Chr low BP - IVF and monitor, no symptoms.  DVT Prophylaxis  Lovenox   See all Orders from today for further details     Leroy Sea M.D on 12/11/2011 at 12:39 PM  [facility] Office  [phone]

## 2011-12-12 ENCOUNTER — Inpatient Hospital Stay (HOSPITAL_COMMUNITY): Payer: Medicare Other

## 2011-12-12 LAB — CBC
Platelets: 364 10*3/uL (ref 150–400)
RBC: 3.67 MIL/uL — ABNORMAL LOW (ref 3.87–5.11)
WBC: 13.3 10*3/uL — ABNORMAL HIGH (ref 4.0–10.5)

## 2011-12-12 LAB — URINE CULTURE: Culture  Setup Time: 201301040210

## 2011-12-12 LAB — COMPREHENSIVE METABOLIC PANEL
ALT: 58 U/L — ABNORMAL HIGH (ref 0–35)
AST: 35 U/L (ref 0–37)
Alkaline Phosphatase: 95 U/L (ref 39–117)
CO2: 7 mEq/L — CL (ref 19–32)
Calcium: 8.3 mg/dL — ABNORMAL LOW (ref 8.4–10.5)
Chloride: 119 mEq/L — ABNORMAL HIGH (ref 96–112)
GFR calc non Af Amer: 49 mL/min — ABNORMAL LOW (ref >90)
Potassium: 3.7 mEq/L (ref 3.5–5.1)
Sodium: 137 mEq/L (ref 135–145)
Total Bilirubin: 0.2 mg/dL — ABNORMAL LOW (ref 0.3–1.2)

## 2011-12-12 LAB — MAGNESIUM: Magnesium: 2 mg/dL (ref 1.5–2.5)

## 2011-12-12 MED ORDER — PANTOPRAZOLE SODIUM 40 MG PO TBEC
40.0000 mg | DELAYED_RELEASE_TABLET | Freq: Two times a day (BID) | ORAL | Status: DC
  Administered 2011-12-12 – 2011-12-14 (×4): 40 mg via ORAL
  Filled 2011-12-12 (×4): qty 1

## 2011-12-12 MED ORDER — DEXTROSE 5 % IV SOLN
INTRAVENOUS | Status: DC
  Administered 2011-12-12 – 2011-12-13 (×2): via INTRAVENOUS
  Filled 2011-12-12 (×4): qty 150

## 2011-12-12 NOTE — Progress Notes (Signed)
Subjective: Nausea a little better  Objective: Vital signs in last 24 hours: Temp:  [97.4 F (36.3 C)-98 F (36.7 C)] 97.9 F (36.6 C) (01/05 0528) Pulse Rate:  [74-87] 87  (01/05 0528) Resp:  [20-22] 20  (01/05 0528) BP: (84-99)/(47-70) 94/60 mmHg (01/05 0528) SpO2:  [97 %-100 %] 97 % (01/05 0528) Weight:  [150 lb 14.4 oz (68.448 kg)] 150 lb 14.4 oz (68.448 kg) (01/05 0528) Last BM Date: 12/12/11  Intake/Output from previous day: 01/04 0701 - 01/05 0700 In: 1310 [P.O.:600; IV Piggyback:710] Out: 2053 [Urine:2050; Stool:3] Intake/Output this shift:    GI: soft non distended.  scars noted.  Lab Results:   Basename 12/12/11 0530 12/10/11 2148  WBC 13.3* 13.5*  HGB 9.8* 11.9*  HCT 29.0* 35.2*  PLT 364 402*   BMET  Basename 12/12/11 0530 12/11/11 1915 12/10/11 2148 12/10/11 1557  NA 137 -- -- 132*  K 3.7 3.9 -- --  CL 119* -- -- 107  CO2 7* -- -- 9*  GLUCOSE 101* -- -- 88  BUN 8 -- -- 17  CREATININE 1.16* -- 1.22* --  CALCIUM 8.3* -- -- 8.5   PT/INR No results found for this basename: LABPROT:2,INR:2 in the last 72 hours ABG No results found for this basename: PHART:2,PCO2:2,PO2:2,HCO3:2 in the last 72 hours  Studies/Results: Ct Abdomen Pelvis W Contrast  12/10/2011  *RADIOLOGY REPORT*  Clinical Data: Vomiting and diarrhea.  History of Crohn's. Gastroesophageal reflux disease.  Hernia surgery.  Bowel resection. Cholecystectomy and appendectomy.  CT ABDOMEN AND PELVIS WITH CONTRAST  Technique:  Multidetector CT imaging of the abdomen and pelvis was performed following the standard protocol during bolus administration of intravenous contrast.  Contrast: 80mL OMNIPAQUE IOHEXOL 300 MG/ML IV SOLN  Comparison: 04/02/2011  Findings: Slight fibrosis in the lung bases.  Large esophageal hiatal hernia with most of the stomach in the chest.  Surgical absence of the gallbladder.  The liver, spleen, pancreas, adrenal glands, kidneys, abdominal aorta, and retroperitoneal lymph  nodes are unremarkable.  Postsurgical changes in the upper abdomen with bowel staples consistent with bowel resections.  There is colonic dilatation with air-fluid levels in the colon down to the level of the sigmoid descending colon junction where there is focal change in caliber. There is enhancement of the wall in this area.  This suggests an inflammatory stricture although colonic neoplasm is not entirely excluded.  The colon proximal to this area is distended.  Of note, the transverse colon extends into the hiatal hernia and there is some narrowing at the point of the hernia but the colon both before and after the hernia is distended.  I believe the site of obstruction is at the lower descending/sigmoid junction.  There is also mild dilatation of small bowel loops with fluid and gas present.  Focal narrowing of a small bowel loop in the mid abdomen probably representing inflammatory stricture.  This is similar to the changes seen on the prior study except there is no acute inflammatory infiltration in this area today. No evidence of abscess.  No free air or free fluid in the abdomen.  There is focal gas in the subcutaneous tissues over the left lower abdomen.  This could represent injection site.  Otherwise, soft tissue infection is suggested.  Pelvis:  The rectum and sigmoid colon are decompressed.  No obvious inflammatory changes.  The bladder wall is not thickened.  Uterus and adnexal structures are not enlarged.  No free or loculated pelvic fluid collections.  The appendix  is surgically absent by history.  There appears to have been a partial right colectomy with ileal colonic anastomoses.  Normal alignment of the lumbar vertebra.  Expansion of the bony spinal canal in the sacrum, likely representing cyst or prominence of the thecal sac.  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   Associated dilatation of the residual small bowel. 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  No intra- abdominal abscess.  Note that there is a large esophageal hiatal hernia containing most of the stomach.  The splenic fracture of the colon also extends up into the hiatal hernia.  Multiple additional findings as discussed.  Findings were discussed by telephone with Dr. Esmeralda Arthur at the time of dictation, 2320 hours on 12/10/2011.  Original Report Authenticated By: Marlon Pel, M.D.   Dg Abd Acute W/chest  12/12/2011  *RADIOLOGY REPORT*  Clinical Data: Crohn disease, fatigue  ACUTE ABDOMEN SERIES (ABDOMEN 2 VIEW & CHEST 1 VIEW)  Comparison: CT abdomen pelvis dated 12/10/2011  Findings: Lungs are clear. No pleural effusion or pneumothorax.  The heart is top normal in size.  Moderate hiatal hernia.  Right subclavian PICC.  Multiple dilated loops of bowel in the left upper abdomen.  When correlating with recent CT, this is likely secondary to distal colonic stricture.  No evidence of free air under the diaphragm on the upright view.  Cystectomy clips in the right upper abdomen.  Additional surgical clips overlying the lower abdomen.  Degenerative changes of the visualized thoracolumbar spine.  IMPRESSION: No evidence of acute cardiopulmonary disease.  Multiple dilated loops of bowel in the left upper abdomen, likely secondary to distal colonic stricture when correlating with CT.  No free air.  Original Report Authenticated By: Charline Bills, M.D.   Dg Abd Acute W/chest  12/10/2011  *RADIOLOGY REPORT*  Clinical Data: Abdominal pain and distention, nausea, vomiting, acid reflux, chest discomfort, shortness of breath; history Crohn's disease, GERD  ACUTE ABDOMEN SERIES (ABDOMEN 2 VIEW & CHEST 1 VIEW)  Comparison: 10/13/2011 abdominal radiographs, 11/06/2011 chest radiographs  Findings: Normal heart size and pulmonary vascularity. Mildly tortuous thoracic aorta.  Moderate to large hiatal hernia. Lungs clear. No pleural effusion or pneumothorax. Bones appear demineralized. Air filled loops of bowel under left diaphragm similar to prior study. Scattered surgical clips in abdomen. Paucity of colonic gas. Few prominent small bowel loops in mid abdomen with air-fluid levels on upright view. Small amount gas within stomach. Bones diffusely demineralized. No definite bowel wall thickening or free intraperitoneal air. No urinary tract calcification.  IMPRESSION: Hiatal hernia. Few mildly prominent small bowel loops in the abdomen, with a few scattered air-fluid levels on upright view of paucity of colonic gas. Partial or early small bowel obstruction not excluded. Consider follow-up CT imaging of the abdomen pelvis with IV and oral contrast if clinically indicated.  Original Report Authenticated By: Lollie Marrow, M.D.    Anti-infectives: Anti-infectives     Start     Dose/Rate Route Frequency Ordered Stop   12/11/11 1400   metroNIDAZOLE (FLAGYL) IVPB 500 mg        500 mg 100 mL/hr over 60 Minutes Intravenous 3 times per day 12/11/11 1207     12/11/11 1300   ciprofloxacin (CIPRO) IVPB 400 mg        400 mg 200 mL/hr over 60 Minutes Intravenous Every 12 hours  12/11/11 1207     12/10/11 2100   cefTRIAXone (ROCEPHIN) 1 g in dextrose 5 % 50 mL IVPB  Status:  Discontinued        1 g 100 mL/hr over 30 Minutes Intravenous Every 24 hours 12/10/11 2057 12/11/11 1244          Assessment/Plan: s/p  No surgical need for Crohn's disease.  Being treated for Hiatal hernia.  Can follow up with Dr Michaell Cowing for hiatal hernia if needed.  Will sign off.  LOS: 2 days    Veverly Larimer A. 12/12/2011

## 2011-12-12 NOTE — Progress Notes (Signed)
Patient ID: Danielle [name], female   DOB: 01-14-1946, 66 y.o.   MRN: [id number] [facility] Gastroenterology Progress Note  AMRITA RADU 66 y.o. 1946/02/21   Subjective: Feels much better. No N/V. Tolerating liquids and wants to eat.  Objective: Vital signs: Filed Vitals:   12/12/11 1617  BP: 105/57  Pulse: 95  Temp: 97.5 F (36.4 C)  Resp: 19    Intake/Output last 24 hrs:  Intake/Output Summary (Last 24 hours) at 12/12/11 2019 Last data filed at 12/12/11 1702  Gross per 24 hour  Intake   1130 ml  Output    502 ml  Net    628 ml    Physical Exam: Gen: alert, no acute distress  Abd: soft, nontender  Lab Results:  Basename 12/12/11 0530 12/11/11 1915 12/10/11 2148 12/10/11 1557  NA 137 -- -- 132*  K 3.7 3.9 -- --  CL 119* -- -- 107  CO2 7* -- -- 9*  GLUCOSE 101* -- -- 88  BUN 8 -- -- 17  CREATININE 1.16* -- 1.22* --  CALCIUM 8.3* -- -- 8.5  MG 2.0 -- 1.4* --  PHOS -- -- 2.5 --    [facility] 12/12/11 0530 12/10/11 1557  AST 35 57*  ALT 58* 81*  ALKPHOS 95 119*  BILITOT 0.2* 0.1*  PROT 7.1 8.6*  ALBUMIN 2.7* 3.2*    Basename 12/12/11 0530 12/10/11 2148 12/10/11 1557  WBC 13.3* 13.5* --  NEUTROABS -- -- 9.4*  HGB 9.8* 11.9* --  HCT 29.0* 35.2* --  MCV 79.0 78.4 --  PLT 364 402* --     Assessment/Plan: Hiatal hernia: PPI PO BID. Advance diet.  Crohn's: Remicade as outpt. No evidence of a flare. F/U with Dr. Karilyn Cota D/C home tomorrow if stable. Will sign off. Call us back if questions.   Tamel Abel C. 12/12/2011, 8:19 PM

## 2011-12-12 NOTE — Progress Notes (Signed)
Danielle [name] BJY:[id number]:[id number] is a 66 y.o. female,  Outpatient Primary MD for the patient is [name], MD, MD  Chief Complaint  Patient presents with  . Emesis        Subjective:   Danielle [name] today has, No headache, No chest pain, No abdominal pain - No Nausea, No new weakness tingling or numbness, No Cough - SOB.    Objective:   Filed Vitals:   12/11/11 1500 12/11/11 1900 12/11/11 2229 12/12/11 0528  BP: 98/61 99/70 84/47  94/60  Pulse: 74 76 85 87  Temp: 98 F (36.7 C) 97.6 F (36.4 C) 97.4 F (36.3 C) 97.9 F (36.6 C)  TempSrc:      Resp: 22 20 20 20   Height:      Weight:    68.448 kg (150 lb 14.4 oz)  SpO2: 97% 97% 100% 97%    Wt Readings from Last 3 Encounters:  12/12/11 68.448 kg (150 lb 14.4 oz)  11/26/11 69.854 kg (154 lb)  11/17/11 71.215 kg (157 lb)     Intake/Output Summary (Last 24 hours) at 12/12/11 1512 Last data filed at 12/12/11 1041  Gross per 24 hour  Intake   1130 ml  Output    703 ml  Net    427 ml    Exam Awake Alert, Oriented *3, No new F.N deficits, Normal affect [place].AT,PERRAL Supple Neck,No JVD, No cervical lymphadenopathy appriciated.  Symmetrical Chest wall movement, Good air movement bilaterally, CTAB RRR,No Gallops,Rubs or new Murmurs, No Parasternal Heave +ve B.Sounds, Abd Soft, Non tender, No organomegaly appriciated, No rebound -guarding or rigidity. No Cyanosis, Clubbing or edema, No new Rash or bruise   Data Review  CBC  Lab 12/12/11 0530 12/10/11 2148 12/10/11 1557  WBC 13.3* 13.5* 14.1*  HGB 9.8* 11.9* 12.8  HCT 29.0* 35.2* 37.7  PLT 364 402* 463*  MCV 79.0 78.4 78.4  MCH 26.7 26.5 26.6  MCHC 33.8 33.8 34.0  RDW 16.2* 16.0* 15.8*  LYMPHSABS -- -- 3.3  MONOABS -- -- 1.2*  EOSABS -- -- 0.1  BASOSABS -- -- 0.0  BANDABS -- -- --    Chemistries   Lab 12/12/11 0530 12/11/11 1915 12/11/11 1036 12/10/11 2148 12/10/11 1557  NA 137 -- -- -- 132*  K 3.7 3.9 2.7* -- 2.2*  CL 119* -- -- -- 107  CO2  7* -- -- -- 9*  GLUCOSE 101* -- -- -- 88  BUN 8 -- -- -- 17  CREATININE 1.16* -- -- 1.22* 1.39*  CALCIUM 8.3* -- -- -- 8.5  MG 2.0 -- -- 1.4* --  AST 35 -- -- -- 57*  ALT 58* -- -- -- 81*  ALKPHOS 95 -- -- -- 119*  BILITOT 0.2* -- -- -- 0.1*   ------------------------------------------------------------------------------------------------------------------ estimated creatinine clearance is 46.6 ml/min (by C-G formula based on Cr of 1.16). ------------------------------------------------------------------------------------------------------------------ No results found for this basename: HGBA1C:2 in the last 72 hours ------------------------------------------------------------------------------------------------------------------ No results found for this basename: CHOL:2,HDL:2,LDLCALC:2,TRIG:2,CHOLHDL:2,LDLDIRECT:2 in the last 72 hours ------------------------------------------------------------------------------------------------------------------  [facility] 12/10/11 2148  TSH 3.822  T4TOTAL --  T3FREE --  THYROIDAB --   ------------------------------------------------------------------------------------------------------------------ No results found for this basename: VITAMINB12:2,FOLATE:2,FERRITIN:2,TIBC:2,IRON:2,RETICCTPCT:2 in the last 72 hours  Coagulation profile No results found for this basename: INR:5,PROTIME:5 in the last 168 hours  No results found for this basename: DDIMER:2 in the last 72 hours  Cardiac Enzymes No results found for this basename: CK:3,CKMB:3,TROPONINI:3,MYOGLOBIN:3 in the last 168 hours ------------------------------------------------------------------------------------------------------------------ No components found with this basename: POCBNP:3  Micro  Results Recent Results (from the past 240 hour(s))  URINE CULTURE     Status: Normal   Collection Time   12/10/11  5:15 PM      Component Value Range Status Comment   Specimen Description URINE,  CLEAN CATCH   Final    Special Requests NONE   Final    Setup Time [id number]   Final    Colony Count >=100,000 COLONIES/ML   Final    Culture     Final    Value: Multiple bacterial morphotypes present, none predominant. Suggest appropriate recollection if clinically indicated.   Report Status 12/12/2011 FINAL   Final     Radiology Reports Ct Abdomen Pelvis W Contrast  12/10/2011  *RADIOLOGY REPORT*  Clinical Data: Vomiting and diarrhea.  History of Crohn's. Gastroesophageal reflux disease.  Hernia surgery.  Bowel resection. Cholecystectomy and appendectomy.  CT ABDOMEN AND PELVIS WITH CONTRAST  Technique:  Multidetector CT imaging of the abdomen and pelvis was performed following the standard protocol during bolus administration of intravenous contrast.  Contrast: 80mL OMNIPAQUE IOHEXOL 300 MG/ML IV SOLN  Comparison: 04/02/2011  Findings: Slight fibrosis in the lung bases.  Large esophageal hiatal hernia with most of the stomach in the chest.  Surgical absence of the gallbladder.  The liver, spleen, pancreas, adrenal glands, kidneys, abdominal aorta, and retroperitoneal lymph nodes are unremarkable.  Postsurgical changes in the upper abdomen with bowel staples consistent with bowel resections.  There is colonic dilatation with air-fluid levels in the colon down to the level of the sigmoid descending colon junction where there is focal change in caliber. There is enhancement of the wall in this area.  This suggests an inflammatory stricture although colonic neoplasm is not entirely excluded.  The colon proximal to this area is distended.  Of note, the transverse colon extends into the hiatal hernia and there is some narrowing at the point of the hernia but the colon both before and after the hernia is distended.  I believe the site of obstruction is at the lower descending/sigmoid junction.  There is also mild dilatation of small bowel loops with fluid and gas present.  Focal narrowing of a small bowel  loop in the mid abdomen probably representing inflammatory stricture.  This is similar to the changes seen on the prior study except there is no acute inflammatory infiltration in this area today. No evidence of abscess.  No free air or free fluid in the abdomen.  There is focal gas in the subcutaneous tissues over the left lower abdomen.  This could represent injection site.  Otherwise, soft tissue infection is suggested.  Pelvis:  The rectum and sigmoid colon are decompressed.  No obvious inflammatory changes.  The bladder wall is not thickened.  Uterus and adnexal structures are not enlarged.  No free or loculated pelvic fluid collections.  The appendix is surgically absent by history.  There appears to have been a partial right colectomy with ileal colonic anastomoses.  Normal alignment of the lumbar vertebra.  Expansion of the bony spinal canal in the sacrum, likely representing cyst or prominence of the thecal sac.  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  Associated dilatation of the residual small bowel. 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  No intra- abdominal abscess.  Note that there is a large esophageal hiatal hernia containing most of the  stomach.  The splenic fracture of the colon also extends up into the hiatal hernia.  Multiple additional findings as discussed.  Findings were discussed by telephone with Dr. Esmeralda Arthur at the time of dictation, 2320 hours on 12/10/2011.  Original Report Authenticated By: Marlon Pel, M.D.   Dg Esophagus  11/11/2011  *RADIOLOGY REPORT*  Clinical Data: Large hiatal hernia on endoscopy.  ESOPHOGRAM/BARIUM SWALLOW  Technique:  Combined double contrast and single contrast examination performed using effervescent crystals, thick barium liquid, and thin barium liquid.  Fluoroscopy time:   1.0 minutes.  Comparison:  None.  Findings:  There is pre swallow spillover to the vallecula, with difficulty initiating swallowing.  There are primary peristaltic waves during single swallow evaluation.  Remainder of the exam was limited due to severe patient discomfort.  There is a large hiatal hernia.  IMPRESSION: Exam was limited due to patient condition.  Large hiatal hernia.  Original Report Authenticated By: Reyes Ivan, M.D.   Dg Abd Acute W/chest  12/10/2011  *RADIOLOGY REPORT*  Clinical Data: Abdominal pain and distention, nausea, vomiting, acid reflux, chest discomfort, shortness of breath; history Crohn's disease, GERD  ACUTE ABDOMEN SERIES (ABDOMEN 2 VIEW & CHEST 1 VIEW)  Comparison: 10/13/2011 abdominal radiographs, 11/06/2011 chest radiographs  Findings: Normal heart size and pulmonary vascularity. Mildly tortuous thoracic aorta. Moderate to large hiatal hernia. Lungs clear. No pleural effusion or pneumothorax. Bones appear demineralized. Air filled loops of bowel under left diaphragm similar to prior study. Scattered surgical clips in abdomen. Paucity of colonic gas. Few prominent small bowel loops in mid abdomen with air-fluid levels on upright view. Small amount gas within stomach. Bones diffusely demineralized. No definite bowel wall thickening or free intraperitoneal air. No urinary tract calcification.  IMPRESSION: Hiatal hernia. Few mildly prominent small bowel loops in the abdomen, with a few scattered air-fluid levels on upright view of paucity of colonic gas. Partial or early small bowel obstruction not excluded. Consider follow-up CT imaging of the abdomen pelvis with IV and oral contrast if clinically indicated.  Original Report Authenticated By: Lollie Marrow, M.D.    Scheduled Meds:    . calcium-vitamin D  1 tablet Oral Daily  . ciprofloxacin  400 mg Intravenous Q12H  . dicyclomine  20 mg Oral TID AC & HS  . enoxaparin  40 mg Subcutaneous Q24H  . mulitivitamin with  minerals  1 tablet Oral Daily  . pantoprazole (PROTONIX) IV  40 mg Intravenous Q24H  . potassium chloride  10 mEq Intravenous Q1 Hr x 4  . potassium chloride  40 mEq Oral BID  . saccharomyces boulardii  250 mg Oral BID  . DISCONTD: methylPREDNISolone (SOLU-MEDROL) injection  40 mg Intravenous Q12H  . DISCONTD: metronidazole  500 mg Intravenous Q8H   Continuous Infusions:    . sodium chloride 125 mL/hr at 12/11/11 1432  .  sodium bicarbonate infusion 75 mL/hr at 12/12/11 1159   PRN Meds:.fentaNYL, promethazine, sodium chloride, zolpidem  Assessment & Plan    1. Abd Pain, N&V -SBO with H/O crohn;s for 60yrs -  ? Flare vs Chr stricture - symptoms better, per GI and Surgery Not crohn's flare and no surgery needed, both want Po diet will do, D/W Dr Bosie Clos 12-12-11, DC steroids-Flagyl, IV bicarb OK for 1 day. Outpt GI Rehman follow.  2.Low K and Na - replaced- resolved.  3. UTI - DC rocephin, as on ABX for #1.Cipro  4. Elevated LFTs - monitor, Improved, outpt GI to follow.  5.Chr  low BP - IVF and monitor, no symptoms.  6. Hiatal Hernia - outpt Surg follow.  7. Low BiCarb - due to Diarrhea last 10 days - NML anion gap D/W Dr Arlean Hopping and Bosie Clos  On 12-12-11 - short term replacement. IV drip today.  DVT Prophylaxis  Lovenox   See all Orders from today for further details     Leroy Sea M.D on 12/12/2011 at 3:12 PM  [facility] Office  [phone]

## 2011-12-13 LAB — COMPREHENSIVE METABOLIC PANEL
Albumin: 2.2 g/dL — ABNORMAL LOW (ref 3.5–5.2)
BUN: 7 mg/dL (ref 6–23)
Calcium: 7.2 mg/dL — ABNORMAL LOW (ref 8.4–10.5)
Creatinine, Ser: 1.13 mg/dL — ABNORMAL HIGH (ref 0.50–1.10)
GFR calc Af Amer: 58 mL/min — ABNORMAL LOW (ref >90)
Glucose, Bld: 78 mg/dL (ref 70–99)
Potassium: 2 mEq/L — CL (ref 3.5–5.1)
Total Protein: 5.5 g/dL — ABNORMAL LOW (ref 6.0–8.3)

## 2011-12-13 LAB — CBC
HCT: 25.1 % — ABNORMAL LOW (ref 36.0–46.0)
Hemoglobin: 8.4 g/dL — ABNORMAL LOW (ref 12.0–15.0)
MCH: 26.2 pg (ref 26.0–34.0)
MCHC: 33.5 g/dL (ref 30.0–36.0)
RDW: 16.2 % — ABNORMAL HIGH (ref 11.5–15.5)

## 2011-12-13 LAB — MAGNESIUM: Magnesium: 1.5 mg/dL (ref 1.5–2.5)

## 2011-12-13 LAB — POTASSIUM: Potassium: 2.9 mEq/L — ABNORMAL LOW (ref 3.5–5.1)

## 2011-12-13 LAB — GLUCOSE, CAPILLARY: Glucose-Capillary: 87 mg/dL (ref 70–99)

## 2011-12-13 MED ORDER — POTASSIUM CHLORIDE CRYS ER 20 MEQ PO TBCR
40.0000 meq | EXTENDED_RELEASE_TABLET | Freq: Three times a day (TID) | ORAL | Status: AC
  Administered 2011-12-13 (×3): 40 meq via ORAL
  Filled 2011-12-13 (×3): qty 2

## 2011-12-13 MED ORDER — SODIUM BICARBONATE 650 MG PO TABS
650.0000 mg | ORAL_TABLET | Freq: Two times a day (BID) | ORAL | Status: DC
  Administered 2011-12-13 – 2011-12-14 (×3): 650 mg via ORAL
  Filled 2011-12-13 (×4): qty 1

## 2011-12-13 MED ORDER — POTASSIUM CHLORIDE 10 MEQ/100ML IV SOLN: 10.0000 meq | INTRAVENOUS | Status: DC

## 2011-12-13 MED ORDER — POTASSIUM CHLORIDE 10 MEQ/100ML IV SOLN
10.0000 meq | INTRAVENOUS | Status: AC
  Administered 2011-12-13 (×5): 10 meq via INTRAVENOUS
  Filled 2011-12-13 (×6): qty 100

## 2011-12-13 MED ORDER — POTASSIUM CHLORIDE 10 MEQ/100ML IV SOLN
10.0000 meq | Freq: Once | INTRAVENOUS | Status: AC
  Administered 2011-12-13: 10 meq via INTRAVENOUS

## 2011-12-13 MED ORDER — MAGNESIUM SULFATE 40 MG/ML IJ SOLN
2.0000 g | Freq: Once | INTRAMUSCULAR | Status: AC
  Administered 2011-12-13: 2 g via INTRAVENOUS
  Filled 2011-12-13: qty 50

## 2011-12-13 NOTE — Progress Notes (Signed)
Danielle [name] [id number] is a 66 y.o. female,  Outpatient Primary MD for the patient is [name], MD, MD  Chief Complaint  Patient presents with  . Emesis        Subjective:   Danielle [name] today has, No headache, No chest pain, No abdominal pain - No Nausea, No new weakness tingling or numbness, No Cough - SOB.    Objective:   Filed Vitals:   12/12/11 1617 12/12/11 2100 12/13/11 0358 12/13/11 0415  BP: 105/57 88/50 84/37  90/48  Pulse: 95 94 91   Temp: 97.5 F (36.4 C) 98.3 F (36.8 C) 98.3 F (36.8 C)   TempSrc: Oral Oral Oral   Resp: 19 18 16    Height:      Weight:   68.6 kg (151 lb 3.8 oz)   SpO2: 100% 100% 98%     Wt Readings from Last 3 Encounters:  12/13/11 68.6 kg (151 lb 3.8 oz)  11/26/11 69.854 kg (154 lb)  11/17/11 71.215 kg (157 lb)     Intake/Output Summary (Last 24 hours) at 12/13/11 1432 Last data filed at 12/13/11 0911  Gross per 24 hour  Intake 2306.25 ml  Output      0 ml  Net 2306.25 ml    Exam Awake Alert, Oriented *3, No new F.N deficits, Normal affect [place].AT,PERRAL Supple Neck,No JVD, No cervical lymphadenopathy appriciated.  Symmetrical Chest wall movement, Good air movement bilaterally, CTAB RRR,No Gallops,Rubs or new Murmurs, No Parasternal Heave +ve B.Sounds, Abd Soft, Non tender, No organomegaly appriciated, No rebound -guarding or rigidity. No Cyanosis, Clubbing or edema, No new Rash or bruise   Data Review  CBC  Lab 12/13/11 0500 12/12/11 0530 12/10/11 2148 12/10/11 1557  WBC 11.7* 13.3* 13.5* 14.1*  HGB 8.4* 9.8* 11.9* 12.8  HCT 25.1* 29.0* 35.2* 37.7  PLT 312 364 402* 463*  MCV 78.2 79.0 78.4 78.4  MCH 26.2 26.7 26.5 26.6  MCHC 33.5 33.8 33.8 34.0  RDW 16.2* 16.2* 16.0* 15.8*  LYMPHSABS -- -- -- 3.3  MONOABS -- -- -- 1.2*  EOSABS -- -- -- 0.1  BASOSABS -- -- -- 0.0  BANDABS -- -- -- --    Chemistries   Lab 12/13/11 0500 12/12/11 0530 12/11/11 1915 12/11/11 1036 12/10/11 2148 12/10/11 1557  NA  140 137 -- -- -- 132*  K 2.0* 3.7 3.9 2.7* -- 2.2*  CL 114* 119* -- -- -- 107  CO2 15* 7* -- -- -- 9*  GLUCOSE 78 101* -- -- -- 88  BUN 7 8 -- -- -- 17  CREATININE 1.13* 1.16* -- -- 1.22* 1.39*  CALCIUM 7.2* 8.3* -- -- -- 8.5  MG 1.5 2.0 -- -- 1.4* --  AST 16 35 -- -- -- 57*  ALT 35 58* -- -- -- 81*  ALKPHOS 77 95 -- -- -- 119*  BILITOT 0.2* 0.2* -- -- -- 0.1*   ------------------------------------------------------------------------------------------------------------------ estimated creatinine clearance is 47.9 ml/min (by C-G formula based on Cr of 1.13). ------------------------------------------------------------------------------------------------------------------ No results found for this basename: HGBA1C:2 in the last 72 hours ------------------------------------------------------------------------------------------------------------------ No results found for this basename: CHOL:2,HDL:2,LDLCALC:2,TRIG:2,CHOLHDL:2,LDLDIRECT:2 in the last 72 hours ------------------------------------------------------------------------------------------------------------------  [facility] 12/10/11 2148  TSH 3.822  T4TOTAL --  T3FREE --  THYROIDAB --   ------------------------------------------------------------------------------------------------------------------ No results found for this basename: VITAMINB12:2,FOLATE:2,FERRITIN:2,TIBC:2,IRON:2,RETICCTPCT:2 in the last 72 hours  Coagulation profile No results found for this basename: INR:5,PROTIME:5 in the last 168 hours  No results found for this basename: DDIMER:2 in the last 72 hours  Cardiac Enzymes No results found for this basename: CK:3,CKMB:3,TROPONINI:3,MYOGLOBIN:3 in the last 168 hours ------------------------------------------------------------------------------------------------------------------ No components found with this basename: POCBNP:3  Micro Results Recent Results (from the past 240 hour(s))  URINE CULTURE      Status: Normal   Collection Time   12/10/11  5:15 PM      Component Value Range Status Comment   Specimen Description URINE, CLEAN CATCH   Final    Special Requests NONE   Final    Setup Time [id number]   Final    Colony Count >=100,000 COLONIES/ML   Final    Culture     Final    Value: Multiple bacterial morphotypes present, none predominant. Suggest appropriate recollection if clinically indicated.   Report Status 12/12/2011 FINAL   Final     Radiology Reports Ct Abdomen Pelvis W Contrast  12/10/2011  *RADIOLOGY REPORT*  Clinical Data: Vomiting and diarrhea.  History of Crohn's. Gastroesophageal reflux disease.  Hernia surgery.  Bowel resection. Cholecystectomy and appendectomy.  CT ABDOMEN AND PELVIS WITH CONTRAST  Technique:  Multidetector CT imaging of the abdomen and pelvis was performed following the standard protocol during bolus administration of intravenous contrast.  Contrast: 80mL OMNIPAQUE IOHEXOL 300 MG/ML IV SOLN  Comparison: 04/02/2011  Findings: Slight fibrosis in the lung bases.  Large esophageal hiatal hernia with most of the stomach in the chest.  Surgical absence of the gallbladder.  The liver, spleen, pancreas, adrenal glands, kidneys, abdominal aorta, and retroperitoneal lymph nodes are unremarkable.  Postsurgical changes in the upper abdomen with bowel staples consistent with bowel resections.  There is colonic dilatation with air-fluid levels in the colon down to the level of the sigmoid descending colon junction where there is focal change in caliber. There is enhancement of the wall in this area.  This suggests an inflammatory stricture although colonic neoplasm is not entirely excluded.  The colon proximal to this area is distended.  Of note, the transverse colon extends into the hiatal hernia and there is some narrowing at the point of the hernia but the colon both before and after the hernia is distended.  I believe the site of obstruction is at the lower  descending/sigmoid junction.  There is also mild dilatation of small bowel loops with fluid and gas present.  Focal narrowing of a small bowel loop in the mid abdomen probably representing inflammatory stricture.  This is similar to the changes seen on the prior study except there is no acute inflammatory infiltration in this area today. No evidence of abscess.  No free air or free fluid in the abdomen.  There is focal gas in the subcutaneous tissues over the left lower abdomen.  This could represent injection site.  Otherwise, soft tissue infection is suggested.  Pelvis:  The rectum and sigmoid colon are decompressed.  No obvious inflammatory changes.  The bladder wall is not thickened.  Uterus and adnexal structures are not enlarged.  No free or loculated pelvic fluid collections.  The appendix is surgically absent by history.  There appears to have been a partial right colectomy with ileal colonic anastomoses.  Normal alignment of the lumbar vertebra.  Expansion of the bony spinal canal in the sacrum, likely representing cyst or prominence of the thecal sac.  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  Associated dilatation of the residual small bowel. Findings are consistent with Crohn's stricture although  neoplasm is not entirely excluded.  No intra- abdominal abscess.  Note that there is a large esophageal hiatal hernia containing most of the stomach.  The splenic fracture of the colon also extends up into the hiatal hernia.  Multiple additional findings as discussed.  Findings were discussed by telephone with Dr. Esmeralda Arthur at the time of dictation, 2320 hours on 12/10/2011.  Original Report Authenticated By: Marlon Pel, M.D.   Dg Esophagus  11/11/2011  *RADIOLOGY REPORT*  Clinical Data: Large hiatal hernia on endoscopy.  ESOPHOGRAM/BARIUM SWALLOW  Technique:  Combined double  contrast and single contrast examination performed using effervescent crystals, thick barium liquid, and thin barium liquid.  Fluoroscopy time:  1.0 minutes.  Comparison:  None.  Findings:  There is pre swallow spillover to the vallecula, with difficulty initiating swallowing.  There are primary peristaltic waves during single swallow evaluation.  Remainder of the exam was limited due to severe patient discomfort.  There is a large hiatal hernia.  IMPRESSION: Exam was limited due to patient condition.  Large hiatal hernia.  Original Report Authenticated By: Reyes Ivan, M.D.   Dg Abd Acute W/chest  12/10/2011  *RADIOLOGY REPORT*  Clinical Data: Abdominal pain and distention, nausea, vomiting, acid reflux, chest discomfort, shortness of breath; history Crohn's disease, GERD  ACUTE ABDOMEN SERIES (ABDOMEN 2 VIEW & CHEST 1 VIEW)  Comparison: 10/13/2011 abdominal radiographs, 11/06/2011 chest radiographs  Findings: Normal heart size and pulmonary vascularity. Mildly tortuous thoracic aorta. Moderate to large hiatal hernia. Lungs clear. No pleural effusion or pneumothorax. Bones appear demineralized. Air filled loops of bowel under left diaphragm similar to prior study. Scattered surgical clips in abdomen. Paucity of colonic gas. Few prominent small bowel loops in mid abdomen with air-fluid levels on upright view. Small amount gas within stomach. Bones diffusely demineralized. No definite bowel wall thickening or free intraperitoneal air. No urinary tract calcification.  IMPRESSION: Hiatal hernia. Few mildly prominent small bowel loops in the abdomen, with a few scattered air-fluid levels on upright view of paucity of colonic gas. Partial or early small bowel obstruction not excluded. Consider follow-up CT imaging of the abdomen pelvis with IV and oral contrast if clinically indicated.  Original Report Authenticated By: Lollie Marrow, M.D.    Scheduled Meds:    . calcium-vitamin D  1 tablet Oral Daily  .  ciprofloxacin  400 mg Intravenous Q12H  . dicyclomine  20 mg Oral TID AC & HS  . enoxaparin  40 mg Subcutaneous Q24H  . magnesium sulfate 1 - 4 g bolus IVPB  2 g Intravenous Once  . mulitivitamin with minerals  1 tablet Oral Daily  . pantoprazole  40 mg Oral BID AC  . potassium chloride  10 mEq Intravenous Q1 Hr x 6  . potassium chloride  40 mEq Oral TID  . saccharomyces boulardii  250 mg Oral BID  . sodium bicarbonate  650 mg Oral BID  . DISCONTD: methylPREDNISolone (SOLU-MEDROL) injection  40 mg Intravenous Q12H  . DISCONTD: metronidazole  500 mg Intravenous Q8H  . DISCONTD: pantoprazole (PROTONIX) IV  40 mg Intravenous Q24H  . DISCONTD: potassium chloride  10 mEq Intravenous Q1 Hr x 4   Continuous Infusions:    . DISCONTD:  sodium bicarbonate infusion 75 mL/hr at 12/13/11 0003   PRN Meds:.fentaNYL, promethazine, sodium chloride, zolpidem  Assessment & Plan    1. Abd Pain, N&V -SBO with H/O crohn;s for 70yrs -  ? Flare vs Chr stricture - symptoms better, per GI  and Surgery Not crohn's flare and no surgery needed, both want Po diet will do, D/W Dr Bosie Clos 12-12-11, DC steroids-Flagyl,Outpt GI Rehman follow. Tolerating PO diet.  2. Severe hypokalemia - aggressive IV and Po replacement along with IV Mag, recheck in PM.  3. UTI - treated.  4. Elevated LFTs - monitor, resolved, outpt Follow by GI.  5.Chr low BP - IVF and monitor, no symptoms. Stable.  6. Hiatal Hernia - outpt Surg follow.  7. Low BiCarb - due to Diarrhea last 10 days - NML anion gap D/W Dr Arlean Hopping and Bosie Clos  On 12-12-11 - short term replacement. Switch to PO Bicarb.  DVT Prophylaxis  Lovenox   See all Orders from today for further details     Leroy Sea M.D on 12/13/2011 at 2:32 PM  [facility] Office  [phone]

## 2011-12-13 NOTE — Progress Notes (Signed)
CRITICAL VALUE ALERT  Critical value received:  POTASSIUM 2.0  Date of notification:  12/13/11  Time of notification:  0810  Critical value read back:yes  Nurse who received alert:  Courtney Heys, RN  MD notified (1st page):  DR. [facility]  Time of first page:  [phone]  MD notified (2nd page):  Time of second page:  Responding MD:  DR. Thedore Mins  Time MD responded:  [phone]

## 2011-12-13 NOTE — Progress Notes (Signed)
Pt's BP 77/61 & 88/49 with machine by NT.  Recheck 96/58 manually  Pt asymptomatic.  Dr Sedonia Small informed.  Instructed make note that BP be checked with manual cuff.

## 2011-12-14 LAB — CBC
MCV: 78 fL (ref 78.0–100.0)
Platelets: 269 10*3/uL (ref 150–400)
RBC: 3.23 MIL/uL — ABNORMAL LOW (ref 3.87–5.11)
RDW: 16.4 % — ABNORMAL HIGH (ref 11.5–15.5)
WBC: 11.1 10*3/uL — ABNORMAL HIGH (ref 4.0–10.5)

## 2011-12-14 LAB — COMPREHENSIVE METABOLIC PANEL
ALT: 28 U/L (ref 0–35)
AST: 14 U/L (ref 0–37)
Albumin: 2.2 g/dL — ABNORMAL LOW (ref 3.5–5.2)
CO2: 18 mEq/L — ABNORMAL LOW (ref 19–32)
Chloride: 113 mEq/L — ABNORMAL HIGH (ref 96–112)
Creatinine, Ser: 1.07 mg/dL (ref 0.50–1.10)
Potassium: 3.4 mEq/L — ABNORMAL LOW (ref 3.5–5.1)
Sodium: 138 mEq/L (ref 135–145)
Total Bilirubin: 0.2 mg/dL — ABNORMAL LOW (ref 0.3–1.2)

## 2011-12-14 MED ORDER — SODIUM BICARBONATE 650 MG PO TABS: 650.0000 mg | ORAL_TABLET | Freq: Two times a day (BID) | ORAL | Status: DC

## 2011-12-14 MED ORDER — POTASSIUM CHLORIDE CRYS ER 20 MEQ PO TBCR
40.0000 meq | EXTENDED_RELEASE_TABLET | Freq: Once | ORAL | Status: AC
  Administered 2011-12-14: 40 meq via ORAL
  Filled 2011-12-14: qty 2

## 2011-12-14 NOTE — Progress Notes (Signed)
Per MD order, PICC line removed. Cath intact at 37cm. Vaseline pressure gauze to site, pressure held x 5min. No bleeding to site. Pt instructed to keep dressing CDI x 24 hours. Avoid heavy lifting, pushing or pulling x 24 hours,  If bleeding occurs hold pressure, if bleeding does not stop contact MD or go to the ED. Pt does not have any questions. Yarielys Beed M  

## 2011-12-14 NOTE — Discharge Summary (Addendum)
Danielle [name], 66 y.o., DOB [birth year], MRN [id number]. Admission date: 12/10/2011 Discharge Date 12/14/2011 Primary MD [name], MD, MD Admitting Physician Leroy Sea, MD  Admission Diagnosis  Hypokalemia [276.8] Weakness [780.79] EMESIS  Discharge Diagnosis   Active Problems:  Hypokalemia  Leukocytosis  Crohn's disease  GERD (gastroesophageal reflux disease)  Hyponatremia  Hiatal hernia  UTI (lower urinary tract infection)  Weakness generalized  Back pain  SBO (small bowel obstruction)  Anemia  Elevated LFTs       Past Medical History  Diagnosis Date  . Crohn's disease   . Chronic diarrhea   . Joint pain   . GERD (gastroesophageal reflux disease)   . Polyarthralgia   . Shortness of breath   . Pneumonia   . Anemia   . Hernia (acquired) (recurrent)     hiatal  . Hiatal hernia   . Back pain     Past Surgical History  Procedure Date  . Bowel resection   . Colostomy     [name]  . Anal stenosis   . Colonoscopy 2000  . Cholecystectomy   . Appendectomy   . Goiter 1999  . Esophagogastroduodenoscopy 11/11/2011    Procedure: ESOPHAGOGASTRODUODENOSCOPY (EGD);  Surgeon: Barrie Folk, MD;  Location: [facility] ENDOSCOPY;  Service: Endoscopy;  Laterality: N/A;  . Flexible sigmoidoscopy 11/11/2011    Procedure: FLEXIBLE SIGMOIDOSCOPY;  Surgeon: Barrie Folk, MD;  Location: [facility] ENDOSCOPY;  Service: Endoscopy;  Laterality: N/A;     Hospital Course See H&P, Labs, Consult and Test reports for all details in brief, patient was admitted for     1. Abd Pain, N&V -SBO with H/O crohn;s for 75yrs - ? Flare vs Chr stricture - symptoms better, per GI and Surgery Not crohn's flare and no surgery needed, ? Mild gastroenteritis, tolerating Po diet well for 48hrs, D/W Dr Bosie Clos 12-12-11, DC steroids-Flagyl,Outpt GI Rehman follow for remicade.   2. Severe hypokalemia - much better post replacement, given 40mg  PO more today, please check outpt in 2 days.   3. ? Mild UTI - treated.     4. Elevated LFTs - monitor, resolved, outpt Follow by GI.    5.Chr low BP -  no symptoms. Stable.    6. Hiatal Hernia - outpt Surg follow.    7. Low BiCarb - due to Diarrhea last 10 days - NML anion gap D/W Dr Arlean Hopping and Bosie Clos On 12-12-11 - short term replacement. Switch to PO Bicarb for 2 more days.    Consults GI  Significant Tests:  See full reports for all details     Ct Abdomen Pelvis W Contrast  12/10/2011  *RADIOLOGY REPORT*  Clinical Data: Vomiting and diarrhea.  History of Crohn's. Gastroesophageal reflux disease.  Hernia surgery.  Bowel resection. Cholecystectomy and appendectomy.  CT ABDOMEN AND PELVIS WITH CONTRAST  Technique:  Multidetector CT imaging of the abdomen and pelvis was performed following the standard protocol during bolus administration of intravenous contrast.  Contrast: 80mL OMNIPAQUE IOHEXOL 300 MG/ML IV SOLN  Comparison: 04/02/2011  Findings: Slight fibrosis in the lung bases.  Large esophageal hiatal hernia with most of the stomach in the chest.  Surgical absence of the gallbladder.  The liver, spleen, pancreas, adrenal glands, kidneys, abdominal aorta, and retroperitoneal lymph nodes are unremarkable.  Postsurgical changes in the upper abdomen with bowel staples consistent with bowel resections.  There is colonic dilatation with air-fluid levels in the colon down to the level of the sigmoid descending colon junction where there is  focal change in caliber. There is enhancement of the wall in this area.  This suggests an inflammatory stricture although colonic neoplasm is not entirely excluded.  The colon proximal to this area is distended.  Of note, the transverse colon extends into the hiatal hernia and there is some narrowing at the point of the hernia but the colon both before and after the hernia is distended.  I believe the site of obstruction is at the lower descending/sigmoid junction.  There is also mild dilatation of small bowel loops with fluid and  gas present.  Focal narrowing of a small bowel loop in the mid abdomen probably representing inflammatory stricture.  This is similar to the changes seen on the prior study except there is no acute inflammatory infiltration in this area today. No evidence of abscess.  No free air or free fluid in the abdomen.  There is focal gas in the subcutaneous tissues over the left lower abdomen.  This could represent injection site.  Otherwise, soft tissue infection is suggested.  Pelvis:  The rectum and sigmoid colon are decompressed.  No obvious inflammatory changes.  The bladder wall is not thickened.  Uterus and adnexal structures are not enlarged.  No free or loculated pelvic fluid collections.  The appendix is surgically absent by history.  There appears to have been a partial right colectomy with ileal colonic anastomoses.  Normal alignment of the lumbar vertebra.  Expansion of the bony spinal canal in the sacrum, likely representing cyst or prominence of the thecal sac.  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  Associated dilatation of the residual small bowel. 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  No intra- abdominal abscess.  Note that there is a large esophageal hiatal hernia containing most of the stomach.  The splenic fracture of the colon also extends up into the hiatal hernia.  Multiple additional findings as discussed.  Findings were discussed by telephone with Dr. Esmeralda Arthur at the time of dictation, 2320 hours on 12/10/2011.  Original Report Authenticated By: Marlon Pel, M.D.   Dg Abd Acute W/chest  12/12/2011  *RADIOLOGY REPORT*  Clinical Data: Crohn disease, fatigue  ACUTE ABDOMEN SERIES (ABDOMEN 2 VIEW & CHEST 1 VIEW)  Comparison: CT abdomen pelvis dated 12/10/2011  Findings: Lungs are clear. No pleural effusion or  pneumothorax.  The heart is top normal in size.  Moderate hiatal hernia.  Right subclavian PICC.  Multiple dilated loops of bowel in the left upper abdomen.  When correlating with recent CT, this is likely secondary to distal colonic stricture.  No evidence of free air under the diaphragm on the upright view.  Cystectomy clips in the right upper abdomen.  Additional surgical clips overlying the lower abdomen.  Degenerative changes of the visualized thoracolumbar spine.  IMPRESSION: No evidence of acute cardiopulmonary disease.  Multiple dilated loops of bowel in the left upper abdomen, likely secondary to distal colonic stricture when correlating with CT.  No free air.  Original Report Authenticated By: Charline Bills, M.D.   Dg Abd Acute W/chest  12/10/2011  *RADIOLOGY REPORT*  Clinical Data: Abdominal pain and distention, nausea, vomiting, acid reflux, chest discomfort, shortness of breath; history Crohn's disease, GERD  ACUTE ABDOMEN SERIES (ABDOMEN 2 VIEW & CHEST 1 VIEW)  Comparison: 10/13/2011 abdominal radiographs, 11/06/2011 chest radiographs  Findings: Normal heart size and pulmonary vascularity.  Mildly tortuous thoracic aorta. Moderate to large hiatal hernia. Lungs clear. No pleural effusion or pneumothorax. Bones appear demineralized. Air filled loops of bowel under left diaphragm similar to prior study. Scattered surgical clips in abdomen. Paucity of colonic gas. Few prominent small bowel loops in mid abdomen with air-fluid levels on upright view. Small amount gas within stomach. Bones diffusely demineralized. No definite bowel wall thickening or free intraperitoneal air. No urinary tract calcification.  IMPRESSION: Hiatal hernia. Few mildly prominent small bowel loops in the abdomen, with a few scattered air-fluid levels on upright view of paucity of colonic gas. Partial or early small bowel obstruction not excluded. Consider follow-up CT imaging of the abdomen pelvis with IV and oral contrast if  clinically indicated.  Original Report Authenticated By: Lollie Marrow, M.D.     Today   Subjective:   Danielle [name] today has no headache,no chest abdominal pain,no new weakness tingling or numbness, feels much better wants to go home today.   Objective:   Blood pressure 84/50, pulse 88, temperature 98.3 F (36.8 C), temperature source Oral, resp. rate 16, height 5\' 4"  (1.626 m), weight 73.9 kg (162 lb 14.7 oz), SpO2 100.00%.  Intake/Output Summary (Last 24 hours) at 12/14/11 1335 Last data filed at 12/14/11 1000  Gross per 24 hour  Intake   2340 ml  Output      2 ml  Net   2338 ml    Exam Awake Alert, Oriented *3, No new F.N deficits, Normal affect [place].AT,PERRAL Supple Neck,No JVD, No cervical lymphadenopathy appriciated.  Symmetrical Chest wall movement, Good air movement bilaterally, CTAB RRR,No Gallops,Rubs or new Murmurs, No Parasternal Heave +ve B.Sounds, Abd Soft, Non tender, No organomegaly appriciated, No rebound -guarding or rigidity. No Cyanosis, Clubbing or edema, No new Rash or bruise  Data Review      CBC w Diff: Lab Results  Component Value Date   WBC 11.1* 12/14/2011   HGB 8.5* 12/14/2011   HCT 25.2* 12/14/2011   PLT 269 12/14/2011   LYMPHOPCT 24 12/10/2011   MONOPCT 9 12/10/2011   EOSPCT 1 12/10/2011   BASOPCT 0 12/10/2011   CMP: Lab Results  Component Value Date   NA 138 12/14/2011   K 3.4* 12/14/2011   CL 113* 12/14/2011   CO2 18* 12/14/2011   BUN 6 12/14/2011   CREATININE 1.07 12/14/2011   PROT 5.8* 12/14/2011   ALBUMIN 2.2* 12/14/2011   BILITOT 0.2* 12/14/2011   ALKPHOS 79 12/14/2011   AST 14 12/14/2011   ALT 28 12/14/2011  .  Micro Results Recent Results (from the past 240 hour(s))  URINE CULTURE     Status: Normal   Collection Time   12/10/11  5:15 PM      Component Value Range Status Comment   Specimen Description URINE, CLEAN CATCH   Final    Special Requests NONE   Final    Setup Time [id number]   Final    Colony Count >=100,000 COLONIES/ML   Final     Culture     Final    Value: Multiple bacterial morphotypes present, none predominant. Suggest appropriate recollection if clinically indicated.   Report Status 12/12/2011 FINAL   Final      Discharge Instructions     Follow with Primary MD [name], MD, MD in 2 days   Get CBC, CMP, checked 2 days by Primary MD and again as instructed by your Primary MD.    Get Medicines reviewed and adjusted.  Please request  your Prim.MD to go over all Hospital Tests and Procedure/Radiological results at the follow up, please get all Hospital records sent to your Prim MD by signing hospital release before you go home.  Follow-up Information    Follow up with [facility] K, MD. Make an appointment in 2 days.      Follow up with REHMAN,NAJEEB U, MD. Make an appointment in 1 week.   Contact information:   [address], [address] [place] [address] [phone]         Activity: Fall precautions use walker/cane & assistance as needed  Diet: Heart Healthy, Aspiration precautions.  For Heart failure patients - Check your Weight same time everyday, if you gain over 2 pounds, or you develop in leg swelling, experience more shortness of breath or chest pain, call your Primary MD immediately. Follow Cardiac Low Salt Diet and 1.8 lit/day fluid restriction.  Disposition Home  If you experience worsening of your admission symptoms, develop shortness of breath, life threatening emergency, suicidal or homicidal thoughts you must seek medical attention immediately by calling 911 or calling your MD immediately  if symptoms less severe.  You Must read complete instructions/literature along with all the possible adverse reactions/side effects for all the Medicines you take and that have been prescribed to you. Take any new Medicines after you have completely understood and accpet all the possible adverse reactions/side effects.   Do not drive if your were admitted for syncope or siezures until  you have seen by Primary MD or a Neurologist and advised to drive.   Follow-up Information    Follow up with [facility] K, MD. Make an appointment in 2 days.      Follow up with REHMAN,NAJEEB U, MD. Make an appointment in 1 week.   Contact information:   [address], [address] [place] [address] [phone]          Discharge Medications   Medication List  As of 12/14/2011  1:35 PM   START taking these medications         sodium bicarbonate 650 MG tablet   Take 1 tablet (650 mg total) by mouth 2 (two) times daily.         CONTINUE taking these medications         Calcium-Vitamin D 600-200 MG-UNIT per tablet      dicyclomine 10 MG/5ML syrup   Commonly known as: BENTYL      HYDROcodone-acetaminophen 7.5-500 MG per tablet   Commonly known as: LORTAB   Take 1 Tablet By Mouth 3 Times a Day As Needed      multivitamin per tablet      nystatin-triamcinolone cream   Commonly known as: MYCOLOG II      pantoprazole 40 MG tablet   Commonly known as: PROTONIX      PROBIOTIC FORMULA PO      promethazine 25 MG tablet   Commonly known as: PHENERGAN      REMICADE 100 MG injection   Generic drug: inFLIXimab      spironolactone 50 MG tablet   Commonly known as: ALDACTONE   Take 1 tablet (50 mg total) by mouth daily.      VITAMIN B-12 IJ      zolpidem 10 MG tablet   Commonly known as: AMBIEN          Where to get your medications    These are the prescriptions that you need to pick up.   You  may get these medications from any pharmacy.         sodium bicarbonate 650 MG tablet             Total Time in preparing paper work, data evaluation and todays exam - 35 minutes  Leroy Sea M.D on 12/14/2011 at 1:35 PM  [facility] Office  [phone]

## 2011-12-15 ENCOUNTER — Inpatient Hospital Stay (HOSPITAL_COMMUNITY)
Admission: EM | Admit: 2011-12-15 | Discharge: 2011-12-18 | DRG: 389 | Disposition: A | Discharge status: Home / Self Care | Payer: Medicare Other | Attending: Internal Medicine | Admitting: Internal Medicine

## 2011-12-15 ENCOUNTER — Emergency Department (HOSPITAL_COMMUNITY): Payer: Medicare Other

## 2011-12-15 ENCOUNTER — Encounter (HOSPITAL_COMMUNITY): Payer: Self-pay | Admitting: Emergency Medicine

## 2011-12-15 DIAGNOSIS — K56609 Unspecified intestinal obstruction, unspecified as to partial versus complete obstruction: Secondary | ICD-10-CM | POA: Diagnosis not present

## 2011-12-15 DIAGNOSIS — R112 Nausea with vomiting, unspecified: Secondary | ICD-10-CM | POA: Diagnosis not present

## 2011-12-15 DIAGNOSIS — K219 Gastro-esophageal reflux disease without esophagitis: Secondary | ICD-10-CM

## 2011-12-15 DIAGNOSIS — K449 Diaphragmatic hernia without obstruction or gangrene: Secondary | ICD-10-CM

## 2011-12-15 DIAGNOSIS — K5090 Crohn's disease, unspecified, without complications: Secondary | ICD-10-CM | POA: Diagnosis not present

## 2011-12-15 DIAGNOSIS — D72829 Elevated white blood cell count, unspecified: Secondary | ICD-10-CM | POA: Diagnosis not present

## 2011-12-15 DIAGNOSIS — R12 Heartburn: Secondary | ICD-10-CM | POA: Diagnosis not present

## 2011-12-15 DIAGNOSIS — Z87891 Personal history of nicotine dependence: Secondary | ICD-10-CM | POA: Diagnosis not present

## 2011-12-15 DIAGNOSIS — K6389 Other specified diseases of intestine: Secondary | ICD-10-CM | POA: Diagnosis not present

## 2011-12-15 DIAGNOSIS — D473 Essential (hemorrhagic) thrombocythemia: Secondary | ICD-10-CM

## 2011-12-15 DIAGNOSIS — K5080 Crohn's disease of both small and large intestine without complications: Secondary | ICD-10-CM | POA: Diagnosis not present

## 2011-12-15 DIAGNOSIS — K2990 Gastroduodenitis, unspecified, without bleeding: Secondary | ICD-10-CM | POA: Diagnosis not present

## 2011-12-15 DIAGNOSIS — R197 Diarrhea, unspecified: Secondary | ICD-10-CM

## 2011-12-15 DIAGNOSIS — E871 Hypo-osmolality and hyponatremia: Secondary | ICD-10-CM | POA: Diagnosis not present

## 2011-12-15 DIAGNOSIS — E876 Hypokalemia: Secondary | ICD-10-CM

## 2011-12-15 DIAGNOSIS — M549 Dorsalgia, unspecified: Secondary | ICD-10-CM

## 2011-12-15 DIAGNOSIS — R531 Weakness: Secondary | ICD-10-CM

## 2011-12-15 DIAGNOSIS — R109 Unspecified abdominal pain: Secondary | ICD-10-CM | POA: Diagnosis not present

## 2011-12-15 DIAGNOSIS — R432 Parageusia: Secondary | ICD-10-CM

## 2011-12-15 DIAGNOSIS — N390 Urinary tract infection, site not specified: Secondary | ICD-10-CM

## 2011-12-15 DIAGNOSIS — D649 Anemia, unspecified: Secondary | ICD-10-CM

## 2011-12-15 LAB — CBC
HCT: 34.9 % — ABNORMAL LOW (ref 36.0–46.0)
Hemoglobin: 11.8 g/dL — ABNORMAL LOW (ref 12.0–15.0)
MCHC: 33.8 g/dL (ref 30.0–36.0)
WBC: 12.8 10*3/uL — ABNORMAL HIGH (ref 4.0–10.5)

## 2011-12-15 LAB — COMPREHENSIVE METABOLIC PANEL
ALT: 42 U/L — ABNORMAL HIGH (ref 0–35)
Albumin: 3.1 g/dL — ABNORMAL LOW (ref 3.5–5.2)
Alkaline Phosphatase: 107 U/L (ref 39–117)
BUN: 5 mg/dL — ABNORMAL LOW (ref 6–23)
Chloride: 109 mEq/L (ref 96–112)
GFR calc Af Amer: 64 mL/min — ABNORMAL LOW (ref >90)
Glucose, Bld: 84 mg/dL (ref 70–99)
Potassium: 3.9 mEq/L (ref 3.5–5.1)
Sodium: 138 mEq/L (ref 135–145)
Total Bilirubin: 0.3 mg/dL (ref 0.3–1.2)
Total Protein: 7.7 g/dL (ref 6.0–8.3)

## 2011-12-15 MED ORDER — ADULT MULTIVITAMIN W/MINERALS CH
1.0000 | ORAL_TABLET | Freq: Every day | ORAL | Status: DC
  Administered 2011-12-16 – 2011-12-17 (×2): 1 via ORAL
  Filled 2011-12-15 (×3): qty 1

## 2011-12-15 MED ORDER — SPIRONOLACTONE 50 MG PO TABS
50.0000 mg | ORAL_TABLET | Freq: Every day | ORAL | Status: DC
  Administered 2011-12-16 – 2011-12-17 (×2): 50 mg via ORAL
  Filled 2011-12-15 (×3): qty 1

## 2011-12-15 MED ORDER — MORPHINE SULFATE 4 MG/ML IJ SOLN
4.0000 mg | Freq: Once | INTRAMUSCULAR | Status: DC
  Filled 2011-12-15: qty 1

## 2011-12-15 MED ORDER — SPIRONOLACTONE 50 MG PO TABS: 50.0000 mg | ORAL_TABLET | Freq: Every day | ORAL | Status: DC

## 2011-12-15 MED ORDER — CALCIUM CARBONATE-VITAMIN D 500-200 MG-UNIT PO TABS
1.0000 | ORAL_TABLET | Freq: Every day | ORAL | Status: DC
  Administered 2011-12-17: 1 via ORAL
  Filled 2011-12-15 (×3): qty 1

## 2011-12-15 MED ORDER — ONDANSETRON 4 MG PO TBDP
8.0000 mg | ORAL_TABLET | Freq: Once | ORAL | Status: AC
  Administered 2011-12-15: 8 mg via ORAL
  Filled 2011-12-15: qty 2

## 2011-12-15 MED ORDER — PROMETHAZINE HCL 25 MG/ML IJ SOLN
12.5000 mg | Freq: Four times a day (QID) | INTRAMUSCULAR | Status: DC | PRN
  Administered 2011-12-15 – 2011-12-16 (×2): 12.5 mg via INTRAVENOUS
  Filled 2011-12-15 (×2): qty 1

## 2011-12-15 MED ORDER — ZOLPIDEM TARTRATE 10 MG PO TABS
10.0000 mg | ORAL_TABLET | Freq: Every evening | ORAL | Status: DC | PRN
  Administered 2011-12-16 – 2011-12-17 (×2): 10 mg via ORAL
  Filled 2011-12-15 (×2): qty 1

## 2011-12-15 MED ORDER — HYDROCODONE-ACETAMINOPHEN 5-325 MG PO TABS
2.0000 | ORAL_TABLET | ORAL | Status: DC | PRN
  Administered 2011-12-17 (×2): 2 via ORAL
  Filled 2011-12-15 (×2): qty 2

## 2011-12-15 MED ORDER — HYDROMORPHONE HCL PF 1 MG/ML IJ SOLN
1.0000 mg | INTRAMUSCULAR | Status: DC | PRN
  Administered 2011-12-15 – 2011-12-18 (×12): 1 mg via INTRAVENOUS
  Filled 2011-12-15 (×12): qty 1

## 2011-12-15 MED ORDER — NYSTATIN-TRIAMCINOLONE 100000-0.1 UNIT/GM-% EX CREA
TOPICAL_CREAM | Freq: Four times a day (QID) | CUTANEOUS | Status: DC
  Administered 2011-12-16 – 2011-12-17 (×4): via TOPICAL
  Filled 2011-12-15 (×2): qty 15

## 2011-12-15 MED ORDER — HYDROMORPHONE HCL PF 1 MG/ML IJ SOLN
0.5000 mg | Freq: Once | INTRAMUSCULAR | Status: AC
  Administered 2011-12-15: 0.5 mg via INTRAMUSCULAR
  Filled 2011-12-15: qty 1

## 2011-12-15 MED ORDER — SODIUM CHLORIDE 0.9 % IV SOLN
INTRAVENOUS | Status: AC
  Administered 2011-12-15: 22:00:00 via INTRAVENOUS

## 2011-12-15 MED ORDER — INFLIXIMAB 100 MG IV SOLR: 100.0000 mg | INTRAVENOUS | Status: DC

## 2011-12-15 MED ORDER — SODIUM BICARBONATE 650 MG PO TABS: 650.0000 mg | ORAL_TABLET | Freq: Two times a day (BID) | ORAL | Status: DC

## 2011-12-15 MED ORDER — DICYCLOMINE HCL 10 MG/5ML PO SOLN
20.0000 mg | Freq: Three times a day (TID) | ORAL | Status: DC
  Administered 2011-12-17: 20 mg via ORAL
  Filled 2011-12-15 (×13): qty 10

## 2011-12-15 MED ORDER — POTASSIUM CHLORIDE IN NACL 20-0.9 MEQ/L-% IV SOLN
INTRAVENOUS | Status: DC
  Administered 2011-12-15 – 2011-12-17 (×5): via INTRAVENOUS
  Filled 2011-12-15 (×9): qty 1000

## 2011-12-15 MED ORDER — SODIUM BICARBONATE 650 MG PO TABS
650.0000 mg | ORAL_TABLET | Freq: Two times a day (BID) | ORAL | Status: DC
  Administered 2011-12-15 – 2011-12-17 (×5): 650 mg via ORAL
  Filled 2011-12-15 (×8): qty 1

## 2011-12-15 MED ORDER — HYDROCODONE-ACETAMINOPHEN 10-325 MG PO TABS: 1.0000 | ORAL_TABLET | Freq: Four times a day (QID) | ORAL | Status: DC | PRN

## 2011-12-15 MED ORDER — PANTOPRAZOLE SODIUM 40 MG PO TBEC
40.0000 mg | DELAYED_RELEASE_TABLET | Freq: Every day | ORAL | Status: DC
  Administered 2011-12-15 – 2011-12-17 (×3): 40 mg via ORAL
  Filled 2011-12-15 (×2): qty 1

## 2011-12-15 MED ORDER — PROMETHAZINE HCL 25 MG/ML IJ SOLN
25.0000 mg | Freq: Once | INTRAMUSCULAR | Status: AC
  Administered 2011-12-15: 25 mg via INTRAMUSCULAR

## 2011-12-15 MED ORDER — PROMETHAZINE HCL 25 MG/ML IJ SOLN
25.0000 mg | Freq: Once | INTRAMUSCULAR | Status: DC
  Filled 2011-12-15: qty 1

## 2011-12-15 NOTE — ED Provider Notes (Signed)
History     CSN: [id number]  Arrival date & time 12/15/11  1734   First MD Initiated Contact with Patient 12/15/11 1741      Chief Complaint  Patient presents with  . Abdominal Pain  . Nausea  . Emesis  . Diarrhea    (Consider location/radiation/quality/duration/timing/severity/associated sxs/prior treatment) HPI History provided by pt.   Pt had had N/V/D and diffuse, severe lower abd cramping since 1am today.  Has h/o Crohn's for which she receives remicade treatments q6wks and this pain feels similar.  No associated fever, GU sx or blood in stool.  Per prior chart, pt admitted to hospital for N/V/D and abd pain on 12/09/10.  CT abd/pelvis results: focal inflammatory stricture at junction of sigmoid and descending colon with proximal colonic distension and air-fluid levels.  Associated dilatation of the residual small bowel. 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  Pt reports that she was discharged yesterday at 4pm and was feeling better at that time. She called her GI physician (Dr. Karilyn Cota) today and was told to come to ED but not get a CT abd/pelvis.  Pt also has h/o recurrent SBO and was admitted for high grade SBO in 2010.  Current sx also feel similar to past SBO but more severe today.  Past abd surgeries include appendectomy, cholecystectomy, bowel resection and colostomy.   Past Medical History  Diagnosis Date  . Crohn's disease   . Chronic diarrhea   . Joint pain   . GERD (gastroesophageal reflux disease)   . Polyarthralgia   . Shortness of breath   . Pneumonia   . Anemia   . Hernia (acquired) (recurrent)     hiatal  . Hiatal hernia   . Back pain     Past Surgical History  Procedure Date  . Bowel resection   . Colostomy     [name]  . Anal stenosis   . Colonoscopy 2000  . Cholecystectomy   . Appendectomy   . Goiter 1999  . Esophagogastroduodenoscopy 11/11/2011    Procedure: ESOPHAGOGASTRODUODENOSCOPY (EGD);   Surgeon: Barrie Folk, MD;  Location: [facility] ENDOSCOPY;  Service: Endoscopy;  Laterality: N/A;  . Flexible sigmoidoscopy 11/11/2011    Procedure: FLEXIBLE SIGMOIDOSCOPY;  Surgeon: Barrie Folk, MD;  Location: [facility] ENDOSCOPY;  Service: Endoscopy;  Laterality: N/A;    Family History  Problem Relation Age of Onset  . Diabetes Mother   . Healthy Sister   . Hypertension Brother   . Colon cancer Brother     History  Substance Use Topics  . Smoking status: Former Smoker -- 4 years    Types: Cigarettes    Quit date: 10/05/1996  . Smokeless tobacco: Never Used   Comment: 1 pack every two weeks when she smoked  . Alcohol Use: No    OB History    Grav Para Term Preterm Abortions TAB SAB Ect Mult Living                  Review of Systems  All other systems reviewed and are negative.    Allergies  Aspirin; Morphine and related; and Sulfa antibiotics  Home Medications   Current Outpatient Rx  Name Route Sig Dispense Refill  . CALCIUM-VITAMIN D 600-200 MG-UNIT PO TABS Oral Take 1 tablet by mouth daily.      [name] VITAMIN B-12 IJ Injection Inject as directed every 30 (thirty) days.      [name] DICYCLOMINE HCL 10 MG/5ML PO SOLN Oral  Take 20 mg by mouth 4 (four) times daily -  before meals and at bedtime.     [name] HYDROCODONE-ACETAMINOPHEN 7.5-500 MG PO TABS Oral Take 1 tablet by mouth every 8 (eight) hours as needed. For pain.     . INFLIXIMAB 100 MG IV SOLR Intravenous Inject 100 mg into the vein every 6 (six) weeks. Last infusion was in October , next infusion 11-26-11    . MULTIVITAMINS PO TABS Oral Take 1 tablet by mouth daily.      . NYSTATIN-TRIAMCINOLONE 100000-0.1 UNIT/GM-% EX CREA Topical Apply topically 4 (four) times daily.     [name] PANTOPRAZOLE SODIUM 40 MG PO TBEC Oral Take 40 mg by mouth daily.      [name] PROBIOTIC FORMULA PO Oral Take 1 tablet by mouth daily.     [name] PROMETHAZINE HCL 25 MG PO TABS Oral Take 25 mg by mouth every 6 (six) hours as needed. Nausea    . SODIUM BICARBONATE 650 MG PO  TABS Oral Take 650 mg by mouth 2 (two) times daily.      [name] SPIRONOLACTONE 50 MG PO TABS Oral Take 50 mg by mouth daily.      [name] ZOLPIDEM TARTRATE 10 MG PO TABS Oral Take 10 mg by mouth as needed. insomnia      BP 91/58  Pulse 96  Temp(Src) 98.4 F (36.9 C) (Oral)  Resp 15  SpO2 98%  Physical Exam  Nursing note and vitals reviewed. Constitutional: She is oriented to person, place, and time. She appears well-developed and well-nourished. No distress.  HENT:  Head: Normocephalic and atraumatic.  Mouth/Throat: Oropharynx is clear and moist.  Eyes:       Normal appearance  Neck: Normal range of motion.  Cardiovascular: Normal rate and regular rhythm.   Pulmonary/Chest: Effort normal and breath sounds normal.  Abdominal: Soft. Bowel sounds are normal. She exhibits no distension. There is no tenderness.       Obese.  Diffuse tenderness reported but pt does not appear uncomfortable w/ palpation.   Musculoskeletal: She exhibits no edema and no tenderness.  Neurological: She is alert and oriented to person, place, and time.  Skin: Skin is warm and dry. No rash noted.  Psychiatric: She has a normal mood and affect. Her behavior is normal.    ED Course  Procedures (including critical care time)  Labs Reviewed  CBC - Abnormal; Notable for the following:    WBC 12.8 (*)    Hemoglobin 11.8 (*) DELTA CHECK NOTED   HCT 34.9 (*)    RDW 17.0 (*)    All other components within normal limits  COMPREHENSIVE METABOLIC PANEL - Abnormal; Notable for the following:    BUN 5 (*)    Albumin 3.1 (*)    ALT 42 (*)    GFR calc non Af Amer 55 (*)    GFR calc Af Amer 64 (*)    All other components within normal limits  BASIC METABOLIC PANEL - Abnormal; Notable for the following:    CO2 16 (*)    Glucose, Bld 69 (*)    BUN 5 (*)    Calcium 8.2 (*)    GFR calc non Af Amer 60 (*)    GFR calc Af Amer 70 (*)    All other components within normal limits  CBC - Abnormal; Notable for the following:     WBC 12.9 (*)    RBC 3.80 (*)    Hemoglobin 10.2 (*)    HCT  31.1 (*)    RDW 17.5 (*)    All other components within normal limits  LIPASE, BLOOD   Dg Abd Acute W/chest  12/15/2011  *RADIOLOGY REPORT*  Clinical Data: Crohn's disease  ACUTE ABDOMEN SERIES (ABDOMEN 2 VIEW & CHEST 1 VIEW)  Comparison: Plain film 12/12/2011  Findings: Large hiatal hernia noted.  Chronic scarring at the right lung base.  No free air beneath hemidiaphragms.  There is interval reduction in the dilated loops of bowel seen on comparison exam.  No evidence of gas in the rectum on prior study. Multiple surgical clips noted in the right lower quadrant. Cholecystectomy clips in the right upper quadrant.  IMPRESSION:  1.  Large hiatal hernia unchanged. 2.  Interval improvement in small bowel obstruction pattern compared to prior. 3.  No evidence of intraperitoneal free air.  Original Report Authenticated By: Genevive Bi, M.D.     1. Abdominal pain   2. Nausea vomiting and diarrhea   3. Crohn's   4. Hypokalemia       MDM  Pt w/ h/o crohn's and recurrent SBO presents w/ c/o diffuse lower abd pain and N/V/D.  Was admitted for similar sx on 12/09/10, CT abd/pelvis showed inflammatory stricture at jxn of sigmoid colon and descending bowel as well as large hiatal hernia, pt a GI and surgical consult and was d/c'd home yesterday.  Sx recurred at 1am today and Dr. Karilyn Cota w/ [facility] in [place] referred to ED but advised her not to get a CT abd/pelvis.   Diffuse, mild ttp and exam otherwise unremarkable.  Labs pending.    Labs unremarkable.  Results discussed w/ pt.  She reports improvement in pain but persistent nausea after receiving IM dilaudid and phenergan.  On re-examination, tenderness localized to LLQ and RLQ w/out guarding or rebound.  Zofran ODT ordered.  Dr. Leone Payor w/ [facility] consulted and he is concerned that pt may have another partial SBO.  After reviewing her chart, it appears that the transverse colon  entering her hiatal hernia was overlooked by GI and surgical consultants during patient's most recent admission.  He recommends acute abd series and admission for monitoring and surgery consult.  [facility] consulted for admission.          Arie Sabina [place], [place] 12/16/11 1436

## 2011-12-15 NOTE — H&P (Signed)
PATIENT DETAILS Name: MYLENE BOW Age: 66 y.o. Sex: female Date of Birth: February 27, 1946 Admit Date: 12/15/2011 ZOX:[id number] K, MD, MD   CHIEF COMPLAINT:  Abdominal pain with nausea vomiting and diarrhea  HPI: Mrs. Martie Round is being readmitted to the hospital for similar symptoms. She was admitted on January 3 and discharged yesterday to home. She returns today in the afternoon with abdominal pain with nausea, vomiting and diarrhea. She tried taking Phenergan and hydrocodone/acetaminophen without relief. She denies hematemesis, hematochezia, mucous or greasy stools. She had no fever or chills. The abdominal pain is crampy, sharp, diffuse, 10 out of 10 in severity and only relieved by IV Dilaudid. She was not able to eat anything at home as she would vomit every time. Repeat an acute abdominal series today revealed an interval improvement in her small bowel traction with no evidence of free air and unchanged large hiatal hernia. CT of the abdomen was not repeated for with reasons. Her white count remains elevated when compared to prior admission.   ALLERGIES:   Allergies  Allergen Reactions  . Aspirin Other (See Comments)    Clamps her stomach  . Morphine And Related Itching  . Sulfa Antibiotics Nausea And Vomiting    PAST MEDICAL HISTORY: Past Medical History  Diagnosis Date  . Crohn's disease   . Chronic diarrhea   . Joint pain   . GERD (gastroesophageal reflux disease)   . Polyarthralgia   . Shortness of breath   . Pneumonia   . Anemia   . Hernia (acquired) (recurrent)     hiatal  . Hiatal hernia   . Back pain     PAST SURGICAL HISTORY: Past Surgical History  Procedure Date  . Bowel resection   . Colostomy     [name]  . Anal stenosis   . Colonoscopy 2000  . Cholecystectomy   . Appendectomy   . Goiter 1999  . Esophagogastroduodenoscopy 11/11/2011    Procedure: ESOPHAGOGASTRODUODENOSCOPY (EGD);  Surgeon: Barrie Folk, MD;  Location: [facility] ENDOSCOPY;  Service:  Endoscopy;  Laterality: N/A;  . Flexible sigmoidoscopy 11/11/2011    Procedure: FLEXIBLE SIGMOIDOSCOPY;  Surgeon: Barrie Folk, MD;  Location: [facility] ENDOSCOPY;  Service: Endoscopy;  Laterality: N/A;    MEDICATIONS AT HOME: Prior to Admission medications   Medication Sig Start Date End Date Taking? Authorizing Provider  Calcium-Vitamin D 600-200 MG-UNIT per tablet Take 1 tablet by mouth daily.     Yes Historical Provider, MD  Cyanocobalamin (VITAMIN B-12 IJ) Inject as directed every 30 (thirty) days.     Yes Historical Provider, MD  dicyclomine (BENTYL) 10 MG/5ML syrup Take 20 mg by mouth 4 (four) times daily -  before meals and at bedtime.    Yes Historical Provider, MD  HYDROcodone-acetaminophen (LORTAB) 7.5-500 MG per tablet Take 1 tablet by mouth every 8 (eight) hours as needed. For pain.  07/27/11  Yes Llana Aliment, NP  inFLIXimab (REMICADE) 100 MG injection Inject 100 mg into the vein every 6 (six) weeks. Last infusion was in October , next infusion 11-26-11   Yes Historical Provider, MD  multivitamin [facility]) per tablet Take 1 tablet by mouth daily.     Yes Historical Provider, MD  nystatin-triamcinolone (MYCOLOG II) cream Apply topically 4 (four) times daily.    Yes Historical Provider, MD  pantoprazole (PROTONIX) 40 MG tablet Take 40 mg by mouth daily.     Yes Historical Provider, MD  Probiotic Product (PROBIOTIC FORMULA PO) Take 1 tablet by mouth daily.  Yes Historical Provider, MD  promethazine (PHENERGAN) 25 MG tablet Take 25 mg by mouth every 6 (six) hours as needed. Nausea   Yes Historical Provider, MD  sodium bicarbonate 650 MG tablet Take 650 mg by mouth 2 (two) times daily.   12/14/11 12/13/12 Yes Leroy Sea, MD  spironolactone (ALDACTONE) 50 MG tablet Take 50 mg by mouth daily.   11/26/11 11/25/12 Yes Malissa Hippo, MD  zolpidem (AMBIEN) 10 MG tablet Take 10 mg by mouth as needed. insomnia 08/24/11  Yes Historical Provider, MD    FAMILY HISTORY: Family History  Problem  Relation Age of Onset  . Diabetes Mother   . Healthy Sister   . Hypertension Brother   . Colon cancer Brother     SOCIAL HISTORY:  reports that she quit smoking about 15 years ago. Her smoking use included Cigarettes. She quit after 4 years of use. She has never used smokeless tobacco. She reports that she does not drink alcohol or use illicit drugs. She lives with her sister  REVIEW OF SYSTEMS:   as per history of present illness  PHYSICAL EXAM: Blood pressure 90/59, pulse 101, temperature 98.4 F (36.9 C), temperature source Oral, resp. rate 19, height 5\' 3"  (1.6 m), weight 69 kg (152 lb 1.9 oz), SpO2 100.00%.  General appearance :Awake, alert, not in any distress. Speech Clear. Not toxic Looking HEENT: Atraumatic and Normocephalic, pupils equally reactive to light and accomodation Neck: supple, no JVD. No cervical lymphadenopathy.  Chest:Good air entry bilaterally, no added sounds  CVS: S1 S2 regular, no murmurs.  Abdomen: Multiple surgical scars noted, Bowel sounds diminished, mildly tender to palpation in all 4 quadrants and not distended with no gaurding, rigidity or rebound. Extremities: B/L Lower Ext shows no edema, both legs are warm to touch, with  dorsalis pedis pulses palpable. Neurology: Awake alert, and oriented X 3, CN II-XII intact, Non focal, Deep Tendon Reflex-2+ all over, plantar's downgoing B/L, sensory exam is grossly intact.  Skin:No Rash  LABS ON ADMISSION:   Basename 12/15/11 1818 12/14/11 0500 12/13/11 0500  NA 138 138 --  K 3.9 3.4* --  CL 109 113* --  CO2 19 18* --  GLUCOSE 84 81 --  BUN 5* 6 --  CREATININE 1.05 1.07 --  CALCIUM 9.0 7.5* --  MG -- 2.0 1.5  PHOS -- -- --    Basename 12/15/11 1818 12/14/11 0500  AST 35 14  ALT 42* 28  ALKPHOS 107 79  BILITOT 0.3 0.2*  PROT 7.7 5.8*  ALBUMIN 3.1* 2.2*    Basename 12/15/11 1818  LIPASE 44  AMYLASE --    Basename 12/15/11 1818 12/14/11 0500  WBC 12.8* 11.1*  NEUTROABS -- --  HGB 11.8*  8.5*  HCT 34.9* 25.2*  MCV 80.2 78.0  PLT 302 269   No results found for this basename: CKTOTAL:3,CKMB:3,CKMBINDEX:3,TROPONINI:3 in the last 72 hours No results found for this basename: DDIMER:2 in the last 72 hours No components found with this basename: POCBNP:3   RADIOLOGIC STUDIES ON ADMISSION: Ct Abdomen Pelvis W Contrast  12/10/2011  *RADIOLOGY REPORT*  Clinical Data: Vomiting and diarrhea.  History of Crohn's. Gastroesophageal reflux disease.  Hernia surgery.  Bowel resection. Cholecystectomy and appendectomy.  CT ABDOMEN AND PELVIS WITH CONTRAST  Technique:  Multidetector CT imaging of the abdomen and pelvis was performed following the standard protocol during bolus administration of intravenous contrast.  Contrast: 80mL OMNIPAQUE IOHEXOL 300 MG/ML IV SOLN  Comparison: 04/02/2011  Findings: Slight fibrosis  in the lung bases.  Large esophageal hiatal hernia with most of the stomach in the chest.  Surgical absence of the gallbladder.  The liver, spleen, pancreas, adrenal glands, kidneys, abdominal aorta, and retroperitoneal lymph nodes are unremarkable.  Postsurgical changes in the upper abdomen with bowel staples consistent with bowel resections.  There is colonic dilatation with air-fluid levels in the colon down to the level of the sigmoid descending colon junction where there is focal change in caliber. There is enhancement of the wall in this area.  This suggests an inflammatory stricture although colonic neoplasm is not entirely excluded.  The colon proximal to this area is distended.  Of note, the transverse colon extends into the hiatal hernia and there is some narrowing at the point of the hernia but the colon both before and after the hernia is distended.  I believe the site of obstruction is at the lower descending/sigmoid junction.  There is also mild dilatation of small bowel loops with fluid and gas present.  Focal narrowing of a small bowel loop in the mid abdomen probably  representing inflammatory stricture.  This is similar to the changes seen on the prior study except there is no acute inflammatory infiltration in this area today. No evidence of abscess.  No free air or free fluid in the abdomen.  There is focal gas in the subcutaneous tissues over the left lower abdomen.  This could represent injection site.  Otherwise, soft tissue infection is suggested.  Pelvis:  The rectum and sigmoid colon are decompressed.  No obvious inflammatory changes.  The bladder wall is not thickened.  Uterus and adnexal structures are not enlarged.  No free or loculated pelvic fluid collections.  The appendix is surgically absent by history.  There appears to have been a partial right colectomy with ileal colonic anastomoses.  Normal alignment of the lumbar vertebra.  Expansion of the bony spinal canal in the sacrum, likely representing cyst or prominence of the thecal sac.  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  Associated dilatation of the residual small bowel. 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  No intra- abdominal abscess.  Note that there is a large esophageal hiatal hernia containing most of the stomach.  The splenic fracture of the colon also extends up into the hiatal hernia.  Multiple additional findings as discussed.  Findings were discussed by telephone with Dr. Esmeralda Arthur at the time of dictation, 2320 hours on 12/10/2011.  Original Report Authenticated By: Marlon Pel, M.D.   Dg Abd Acute W/chest  12/15/2011  *RADIOLOGY REPORT*  Clinical Data: Crohn's disease  ACUTE ABDOMEN SERIES (ABDOMEN 2 VIEW & CHEST 1 VIEW)  Comparison: Plain film 12/12/2011  Findings: Large hiatal hernia noted.  Chronic scarring at the right lung base.  No free air beneath hemidiaphragms.  There is interval reduction in the  dilated loops of bowel seen on comparison exam.  No evidence of gas in the rectum on prior study. Multiple surgical clips noted in the right lower quadrant. Cholecystectomy clips in the right upper quadrant.  IMPRESSION:  1.  Large hiatal hernia unchanged. 2.  Interval improvement in small bowel obstruction pattern compared to prior. 3.  No evidence of intraperitoneal free air.  Original Report Authenticated By: Genevive Bi, M.D.   Dg Abd Acute W/chest  12/12/2011  *RADIOLOGY REPORT*  Clinical Data:  Crohn disease, fatigue  ACUTE ABDOMEN SERIES (ABDOMEN 2 VIEW & CHEST 1 VIEW)  Comparison: CT abdomen pelvis dated 12/10/2011  Findings: Lungs are clear. No pleural effusion or pneumothorax.  The heart is top normal in size.  Moderate hiatal hernia.  Right subclavian PICC.  Multiple dilated loops of bowel in the left upper abdomen.  When correlating with recent CT, this is likely secondary to distal colonic stricture.  No evidence of free air under the diaphragm on the upright view.  Cystectomy clips in the right upper abdomen.  Additional surgical clips overlying the lower abdomen.  Degenerative changes of the visualized thoracolumbar spine.  IMPRESSION: No evidence of acute cardiopulmonary disease.  Multiple dilated loops of bowel in the left upper abdomen, likely secondary to distal colonic stricture when correlating with CT.  No free air.  Original Report Authenticated By: Charline Bills, M.D.   Dg Abd Acute W/chest  12/10/2011  *RADIOLOGY REPORT*  Clinical Data: Abdominal pain and distention, nausea, vomiting, acid reflux, chest discomfort, shortness of breath; history Crohn's disease, GERD  ACUTE ABDOMEN SERIES (ABDOMEN 2 VIEW & CHEST 1 VIEW)  Comparison: 10/13/2011 abdominal radiographs, 11/06/2011 chest radiographs  Findings: Normal heart size and pulmonary vascularity. Mildly tortuous thoracic aorta. Moderate to large hiatal hernia. Lungs clear. No pleural effusion or pneumothorax. Bones appear  demineralized. Air filled loops of bowel under left diaphragm similar to prior study. Scattered surgical clips in abdomen. Paucity of colonic gas. Few prominent small bowel loops in mid abdomen with air-fluid levels on upright view. Small amount gas within stomach. Bones diffusely demineralized. No definite bowel wall thickening or free intraperitoneal air. No urinary tract calcification.  IMPRESSION: Hiatal hernia. Few mildly prominent small bowel loops in the abdomen, with a few scattered air-fluid levels on upright view of paucity of colonic gas. Partial or early small bowel obstruction not excluded. Consider follow-up CT imaging of the abdomen pelvis with IV and oral contrast if clinically indicated.  Original Report Authenticated By: Lollie Marrow, M.D.    ASSESSMENT AND PLAN: Present on Admission:  .Abdominal pain .Nausea vomiting and diarrhea .Crohn's disease  Patient is being readmitted for IV fluid support, control of nausea and vomiting and analgesia  GI consultation a.m.  Further plan will depend as patient's clinical course evolves and further radiologic and laboratory data become available. Patient will be monitored closely.   DVT Prophylaxis: SCDs Code Status: Full code  Total time spent for admission equals 45 minutes.  Jonny Ruiz 12/15/2011, 11:22 PM

## 2011-12-15 NOTE — ED Notes (Signed)
Per EMS, pt to ED with c/o Abdominal pain, N/V/D.  Onset this AM approx 0100.

## 2011-12-15 NOTE — ED Notes (Signed)
Attempted IV placement x2.  Unsuccessful.   IV team paged. 

## 2011-12-16 LAB — BASIC METABOLIC PANEL
BUN: 5 mg/dL — ABNORMAL LOW (ref 6–23)
Chloride: 112 mEq/L (ref 96–112)
Creatinine, Ser: 0.97 mg/dL (ref 0.50–1.10)
GFR calc Af Amer: 70 mL/min — ABNORMAL LOW (ref >90)
GFR calc non Af Amer: 60 mL/min — ABNORMAL LOW (ref >90)
Potassium: 3.7 mEq/L (ref 3.5–5.1)

## 2011-12-16 LAB — CBC
MCHC: 32.8 g/dL (ref 30.0–36.0)
Platelets: 287 10*3/uL (ref 150–400)
RDW: 17.5 % — ABNORMAL HIGH (ref 11.5–15.5)
WBC: 12.9 10*3/uL — ABNORMAL HIGH (ref 4.0–10.5)

## 2011-12-16 MED ORDER — WHITE PETROLATUM GEL
Status: AC
  Administered 2011-12-16: 12:00:00
  Filled 2011-12-16: qty 5

## 2011-12-16 MED ORDER — PROMETHAZINE HCL 25 MG/ML IJ SOLN
25.0000 mg | Freq: Four times a day (QID) | INTRAMUSCULAR | Status: DC | PRN
  Administered 2011-12-16 – 2011-12-18 (×7): 25 mg via INTRAVENOUS
  Filled 2011-12-16 (×6): qty 1

## 2011-12-16 MED ORDER — WHITE PETROLATUM GEL
Status: AC
  Administered 2011-12-16: 09:00:00
  Filled 2011-12-16: qty 5

## 2011-12-16 NOTE — Progress Notes (Signed)
Utilization review completed- post discharge 

## 2011-12-16 NOTE — Progress Notes (Signed)
Utilization review complete 

## 2011-12-16 NOTE — Progress Notes (Signed)
   CARE MANAGEMENT NOTE 12/16/2011  Patient:  Danielle [name], Danielle [name]   Account Number:  [id number]  Date Initiated:  12/16/2011  Documentation initiated by:  [facility]  Subjective/Objective Assessment:   n/v, abd pain, Crohn's Disease     Action/Plan:   readmission from 12/10/11-12/14/11  lives w sister, pcp dr Earvin [place]   Anticipated DC Date:  12/18/2011   Anticipated DC Plan:  HOME/SELF CARE      DC Planning Services  CM consult      Choice offered to / List presented to:             Status of service:  In process, will continue to follow Medicare Important Message given?   (If response is "NO", the following Medicare IM given date fields will be blank) Date Medicare IM given:   Date Additional Medicare IM given:    Discharge Disposition:    Per UR Regulation:  Reviewed for med. necessity/level of care/duration of stay  Comments:  12/16/2011 1030 Utilization review complete. Isidoro Donning RN CCM Case Mgmt phone [phone] 1/9 spoke w pt, she lives w sister and is very indep, she does not anticpate any dc needs. debbie Elim Peale rn,bsn [id number]

## 2011-12-16 NOTE — Progress Notes (Signed)
Patient ID: Danielle [name], female   DOB: 1946-05-31, 66 y.o.   MRN: [id number]  Subjective: No events overnight. Patient denies chest pain, shortness of breath, abdominal pain.   Objective:  Vital signs in last 24 hours:  Filed Vitals:   12/15/11 2145 12/15/11 2300 12/16/11 0519 12/16/11 1511  BP: 97/62 90/59 88/59  95/65  Pulse: 98 101 90 102  Temp:  98.4 F (36.9 C) 97.9 F (36.6 C) 99.2 F (37.3 C)  TempSrc:  Oral Oral Oral  Resp: 12 19 16 18   Height:  5\' 3"  (1.6 m)    Weight:  69 kg (152 lb 1.9 oz)    SpO2: 100% 100% 98% 98%    Intake/Output from previous day:   Intake/Output Summary (Last 24 hours) at 12/16/11 1954 Last data filed at 12/16/11 0600  Gross per 24 hour  Intake 766.67 ml  Output      0 ml  Net 766.67 ml    Physical Exam: General: Alert, awake, oriented x3, in no acute distress. HEENT: No bruits, no goiter. Moist mucous membranes, no scleral icterus, no conjunctival pallor. Heart: Regular rate and rhythm, S1/S2 +, no murmurs, rubs, gallops. Lungs: Clear to auscultation bilaterally. No wheezing, no rhonchi, no rales.  Abdomen: Soft, nontender, nondistended, positive bowel sounds. Extremities: No clubbing or cyanosis, no pitting edema,  positive pedal pulses. Neuro: Grossly nonfocal.  Lab Results:  Basic Metabolic Panel:    Component Value Date/Time   NA 139 12/16/2011 0555   K 3.7 12/16/2011 0555   CL 112 12/16/2011 0555   CO2 16* 12/16/2011 0555   BUN 5* 12/16/2011 0555   CREATININE 0.97 12/16/2011 0555   GLUCOSE 69* 12/16/2011 0555   CALCIUM 8.2* 12/16/2011 0555   CBC:    Component Value Date/Time   WBC 12.9* 12/16/2011 0555   HGB 10.2* 12/16/2011 0555   HCT 31.1* 12/16/2011 0555   PLT 287 12/16/2011 0555   MCV 81.8 12/16/2011 0555   NEUTROABS 9.4* 12/10/2011 1557   LYMPHSABS 3.3 12/10/2011 1557   MONOABS 1.2* 12/10/2011 1557   EOSABS 0.1 12/10/2011 1557   BASOSABS 0.0 12/10/2011 1557      Lab 12/16/11 0555 12/15/11 1818 12/14/11 0500 12/13/11 0500 12/12/11  0530 12/10/11 1557  WBC 12.9* 12.8* 11.1* 11.7* 13.3* --  HGB 10.2* 11.8* 8.5* 8.4* 9.8* --  HCT 31.1* 34.9* 25.2* 25.1* 29.0* --  PLT 287 302 269 312 364 --  MCV 81.8 80.2 78.0 78.2 79.0 --  MCH 26.8 27.1 26.3 26.2 26.7 --  MCHC 32.8 33.8 33.7 33.5 33.8 --  RDW 17.5* 17.0* 16.4* 16.2* 16.2* --  LYMPHSABS -- -- -- -- -- 3.3  MONOABS -- -- -- -- -- 1.2*  EOSABS -- -- -- -- -- 0.1  BASOSABS -- -- -- -- -- 0.0  BANDABS -- -- -- -- -- --    Lab 12/16/11 0555 12/15/11 1818 12/14/11 0500 12/13/11 1800 12/13/11 0500 12/12/11 0530 12/10/11 2148  NA 139 138 138 -- 140 137 --  K 3.7 3.9 3.4* 2.9* 2.0* -- --  CL 112 109 113* -- 114* 119* --  CO2 16* 19 18* -- 15* 7* --  GLUCOSE 69* 84 81 -- 78 101* --  BUN 5* 5* 6 -- 7 8 --  CREATININE 0.97 1.05 1.07 -- 1.13* 1.16* --  CALCIUM 8.2* 9.0 7.5* -- 7.2* 8.3* --  MG -- -- 2.0 -- 1.5 2.0 1.4*   No results found for this basename: INR:5,PROTIME:5 in the last  168 hours Cardiac markers: No results found for this basename: CK:3,CKMB:3,TROPONINI:3,MYOGLOBIN:3 in the last 168 hours No components found with this basename: POCBNP:3 Recent Results (from the past 240 hour(s))  URINE CULTURE     Status: Normal   Collection Time   12/10/11  5:15 PM      Component Value Range Status Comment   Specimen Description URINE, CLEAN CATCH   Final    Special Requests NONE   Final    Setup Time [id number]   Final    Colony Count >=100,000 COLONIES/ML   Final    Culture     Final    Value: Multiple bacterial morphotypes present, none predominant. Suggest appropriate recollection if clinically indicated.   Report Status 12/12/2011 FINAL   Final     Studies/Results: Dg Abd Acute W/chest  12/15/2011  *RADIOLOGY REPORT*  Clinical Data: Crohn's disease  ACUTE ABDOMEN SERIES (ABDOMEN 2 VIEW & CHEST 1 VIEW)  Comparison: Plain film 12/12/2011  Findings: Large hiatal hernia noted.  Chronic scarring at the right lung base.  No free air beneath hemidiaphragms.  There is  interval reduction in the dilated loops of bowel seen on comparison exam.  No evidence of gas in the rectum on prior study. Multiple surgical clips noted in the right lower quadrant. Cholecystectomy clips in the right upper quadrant.  IMPRESSION:  1.  Large hiatal hernia unchanged. 2.  Interval improvement in small bowel obstruction pattern compared to prior. 3.  No evidence of intraperitoneal free air.  Original Report Authenticated By: Genevive Bi, M.D.    Medications: Scheduled Meds:   . sodium chloride   Intravenous STAT  . calcium-vitamin D  1 tablet Oral Daily  . dicyclomine  20 mg Oral TID AC & HS  . mulitivitamin with minerals  1 tablet Oral Daily  . nystatin-triamcinolone   Topical QID  . ondansetron  8 mg Oral Once  . pantoprazole  40 mg Oral Q1200  . sodium bicarbonate  650 mg Oral BID  . spironolactone  50 mg Oral Daily  . white petrolatum      . white petrolatum      . DISCONTD: inFLIXimab  100 mg Intravenous Q6 weeks  . DISCONTD: sodium bicarbonate  650 mg Oral BID  . DISCONTD: spironolactone  50 mg Oral Daily   Continuous Infusions:   . 0.9 % NaCl with KCl 20 mEq / L 125 mL/hr at 12/16/11 1525   PRN Meds:.HYDROcodone-acetaminophen, HYDROmorphone (DILAUDID) injection, promethazine, zolpidem, DISCONTD: HYDROcodone-acetaminophen, DISCONTD: promethazine  Assessment/Plan:  Principal Problem:   *Nausea vomiting and diarrhea - likely secondary to small bowel obstruction - we have advanced diet to Clear liquid and then regular as patient has reported she tolerated it fine - we will continue to monitor  - abdominal x ray  Active Problems:   Leukocytosis - we will repeat CBC in am - patient reports no longer having diarrhea and is C. Diff negative   EDUCATION - test results and diagnostic studies were discussed with patient  at the bedside - patient has verbalized the understanding - questions were answered at the bedside and contact information was provided for  additional questions or concerns   LOS: 1 day   Danielle [name] 12/16/2011, 7:54 PM  [facility] Pager: [phone]

## 2011-12-17 ENCOUNTER — Inpatient Hospital Stay (HOSPITAL_COMMUNITY): Payer: Medicare Other

## 2011-12-17 LAB — BASIC METABOLIC PANEL
CO2: 17 mEq/L — ABNORMAL LOW (ref 19–32)
Calcium: 8.2 mg/dL — ABNORMAL LOW (ref 8.4–10.5)
Chloride: 110 mEq/L (ref 96–112)
Glucose, Bld: 95 mg/dL (ref 70–99)
Potassium: 3.5 mEq/L (ref 3.5–5.1)
Sodium: 136 mEq/L (ref 135–145)

## 2011-12-17 LAB — CBC
Hemoglobin: 9.4 g/dL — ABNORMAL LOW (ref 12.0–15.0)
Platelets: 268 10*3/uL (ref 150–400)
RBC: 3.54 MIL/uL — ABNORMAL LOW (ref 3.87–5.11)
WBC: 15.3 10*3/uL — ABNORMAL HIGH (ref 4.0–10.5)

## 2011-12-17 MED ORDER — CIPROFLOXACIN IN D5W 400 MG/200ML IV SOLN: 400.0000 mg | Freq: Two times a day (BID) | INTRAVENOUS | Status: DC

## 2011-12-17 MED ORDER — DIPHENHYDRAMINE HCL 50 MG/ML IJ SOLN
12.5000 mg | Freq: Once | INTRAMUSCULAR | Status: AC
Start: 2011-12-17 — End: 2011-12-17
  Administered 2011-12-17: 12.5 mg via INTRAVENOUS
  Filled 2011-12-17: qty 1

## 2011-12-17 MED ORDER — METRONIDAZOLE IN NACL 5-0.79 MG/ML-% IV SOLN: 500.0000 mg | Freq: Three times a day (TID) | INTRAVENOUS | Status: DC

## 2011-12-17 NOTE — Progress Notes (Signed)
Agree with above 

## 2011-12-17 NOTE — Progress Notes (Signed)
Patient ID: Danielle [name], female   DOB: 07/28/1946, 66 y.o.   MRN: [id number] Subjective: No events overnight. Patient denies chest pain, shortness of breath, abdominal pain. Had bowel movement and reports ambulating.  Objective:  Vital signs in last 24 hours:  Filed Vitals:   12/16/11 1511 12/16/11 2200 12/17/11 0600 12/17/11 1419  BP: 95/65 92/59 105/68 107/63  Pulse: 102 102 103 100  Temp: 99.2 F (37.3 C) 99.6 F (37.6 C) 98 F (36.7 C) 98.2 F (36.8 C)  TempSrc: Oral Oral Oral Oral  Resp: 18 20 20 20   Height:      Weight:      SpO2: 98% 98% 96% 97%    Intake/Output from previous day:  No intake or output data in the 24 hours ending 12/17/11 1644  Physical Exam: General: Alert, awake, oriented x3, in no acute distress. HEENT: No bruits, no goiter. Moist mucous membranes, no scleral icterus, no conjunctival pallor. Heart: Regular rate and rhythm, S1/S2 +, no murmurs, rubs, gallops. Lungs: Clear to auscultation bilaterally. No wheezing, no rhonchi, no rales.  Abdomen: Soft, nontender, nondistended, positive bowel sounds. Extremities: No clubbing or cyanosis, no pitting edema,  positive pedal pulses. Neuro: Grossly nonfocal.  Lab Results:  Basic Metabolic Panel:    Component Value Date/Time   NA 136 12/17/2011 0830   K 3.5 12/17/2011 0830   CL 110 12/17/2011 0830   CO2 17* 12/17/2011 0830   BUN 5* 12/17/2011 0830   CREATININE 1.01 12/17/2011 0830   GLUCOSE 95 12/17/2011 0830   CALCIUM 8.2* 12/17/2011 0830   CBC:    Component Value Date/Time   WBC 15.3* 12/17/2011 0830   HGB 9.4* 12/17/2011 0830   HCT 29.1* 12/17/2011 0830   PLT 268 12/17/2011 0830   MCV 82.2 12/17/2011 0830   NEUTROABS 9.4* 12/10/2011 1557   LYMPHSABS 3.3 12/10/2011 1557   MONOABS 1.2* 12/10/2011 1557   EOSABS 0.1 12/10/2011 1557   BASOSABS 0.0 12/10/2011 1557      Lab 12/17/11 0830 12/16/11 0555 12/15/11 1818 12/14/11 0500 12/13/11 0500  WBC 15.3* 12.9* 12.8* 11.1* 11.7*  HGB 9.4* 10.2* 11.8* 8.5*  8.4*  HCT 29.1* 31.1* 34.9* 25.2* 25.1*  PLT 268 287 302 269 312  MCV 82.2 81.8 80.2 78.0 78.2  MCH 26.6 26.8 27.1 26.3 26.2  MCHC 32.3 32.8 33.8 33.7 33.5  RDW 17.0* 17.5* 17.0* 16.4* 16.2*  LYMPHSABS -- -- -- -- --  MONOABS -- -- -- -- --  EOSABS -- -- -- -- --  BASOSABS -- -- -- -- --  BANDABS -- -- -- -- --    Lab 12/17/11 0830 12/16/11 0555 12/15/11 1818 12/14/11 0500 12/13/11 1800 12/13/11 0500 12/12/11 0530 12/10/11 2148  NA 136 139 138 138 -- 140 -- --  K 3.5 3.7 3.9 3.4* 2.9* -- -- --  CL 110 112 109 113* -- 114* -- --  CO2 17* 16* 19 18* -- 15* -- --  GLUCOSE 95 69* 84 81 -- 78 -- --  BUN 5* 5* 5* 6 -- 7 -- --  CREATININE 1.01 0.97 1.05 1.07 -- 1.13* -- --  CALCIUM 8.2* 8.2* 9.0 7.5* -- 7.2* -- --  MG -- -- -- 2.0 -- 1.5 2.0 1.4*   No results found for this basename: INR:5,PROTIME:5 in the last 168 hours Cardiac markers: No results found for this basename: CK:3,CKMB:3,TROPONINI:3,MYOGLOBIN:3 in the last 168 hours No components found with this basename: POCBNP:3 Recent Results (from the past 240 hour(s))  URINE  CULTURE     Status: Normal   Collection Time   12/10/11  5:15 PM      Component Value Range Status Comment   Specimen Description URINE, CLEAN CATCH   Final    Special Requests NONE   Final    Setup Time [id number]   Final    Colony Count >=100,000 COLONIES/ML   Final    Culture     Final    Value: Multiple bacterial morphotypes present, none predominant. Suggest appropriate recollection if clinically indicated.   Report Status 12/12/2011 FINAL   Final     Studies/Results: Dg Abd 1 View  12/17/2011  *RADIOLOGY REPORT*  Clinical Data: Small bowel obstruction. Chron's disease with right abdominal pain  ABDOMEN - 1 VIEW  Comparison: 12/15/2011 and CT 12/10/2011  Findings: Surgical clips are identified throughout the abdomen with a surgical suture line is seen in the left mid abdomen.  Mild distention of a focal small bowel loop is seen in the left mid abdomen.   Colonic air in the transverse and proximal descending portions of the colon with no distal colonic air is seen.  An increase in the degree of proximal colonic air is identified in comparison with the recent KUB and the appearance of mildly dilated small bowel loop suggest the possibility of interval worsening in the small bowel obstructive pattern.  No free intraperitoneal air is suggested and bony structures are stable.  IMPRESSION: Increasing colonic air and new focal small bowel loop distention suspicious for worsening small bowel obstruction  Original Report Authenticated By: Bertha Stakes, M.D.   Dg Abd Acute W/chest  12/15/2011  *RADIOLOGY REPORT*  Clinical Data: Crohn's disease  ACUTE ABDOMEN SERIES (ABDOMEN 2 VIEW & CHEST 1 VIEW)  Comparison: Plain film 12/12/2011  Findings: Large hiatal hernia noted.  Chronic scarring at the right lung base.  No free air beneath hemidiaphragms.  There is interval reduction in the dilated loops of bowel seen on comparison exam.  No evidence of gas in the rectum on prior study. Multiple surgical clips noted in the right lower quadrant. Cholecystectomy clips in the right upper quadrant.  IMPRESSION:  1.  Large hiatal hernia unchanged. 2.  Interval improvement in small bowel obstruction pattern compared to prior. 3.  No evidence of intraperitoneal free air.  Original Report Authenticated By: Genevive Bi, M.D.    Medications: Scheduled Meds:   . calcium-vitamin D  1 tablet Oral Daily  . dicyclomine  20 mg Oral TID AC & HS  . diphenhydrAMINE  12.5 mg Intravenous Once  . mulitivitamin with minerals  1 tablet Oral Daily  . nystatin-triamcinolone   Topical QID  . pantoprazole  40 mg Oral Q1200  . sodium bicarbonate  650 mg Oral BID  . spironolactone  50 mg Oral Daily  . DISCONTD: ciprofloxacin  400 mg Intravenous Q12H  . DISCONTD: metronidazole  500 mg Intravenous Q8H   Continuous Infusions:   . 0.9 % NaCl with KCl 20 mEq / L 125 mL/hr at 12/17/11 0232    PRN Meds:.HYDROcodone-acetaminophen, HYDROmorphone (DILAUDID) injection, promethazine, zolpidem  Assessment/Plan:   Principal Problem:   *Nausea vomiting and diarrhea  - patient tolerates solid food well - surgery consult - recommended no intervention because no clinical signs of small bowel obstruction - plan to d/c home in am  Active Problems:  Leukocytosis  - likely reactive - patient reports no longer having diarrhea and is C. Diff negative   EDUCATION  - test results and diagnostic  studies were discussed with patient at the bedside  - patient has verbalized the understanding  - questions were answered at the bedside and contact information was provided for additional questions or concerns       LOS: 2 days   Teigan Manner 12/17/2011, 4:44 PM  [facility] Pager: [phone]

## 2011-12-17 NOTE — Progress Notes (Signed)
Subjective: Asked to re-eval we previously saw last week for possibility of worsening SBO. She was actually discharged but came back in with some N/V/D. SInce being readmitted, she has improved steadily. She currently denies pain, N/V. She had a BM today and just finished eating regular diet for lunch. She states she feels fine. However, a repeat x-ray was ordered today which suggested a focal loop of dilated SB could represent SBO and therefore surgery consult called.  Objective: Vital signs in last 24 hours: Temp:  [98 F (36.7 C)-99.6 F (37.6 C)] 98 F (36.7 C) (01/10 0600) Pulse Rate:  [102-103] 103  (01/10 0600) Resp:  [18-20] 20  (01/10 0600) BP: (92-105)/(59-68) 105/68 mmHg (01/10 0600) SpO2:  [96 %-98 %] 96 % (01/10 0600) Last BM Date: 12/16/11  Intake/Output this shift:    Physical Exam: BP 105/68  Pulse 103  Temp(Src) 98 F (36.7 C) (Oral)  Resp 20  Ht 5\' 3"  (1.6 m)  Wt 69 kg (152 lb 1.9 oz)  BMI 26.95 kg/m2  SpO2 96% Abdomen: soft, ND, NT, active BS.  Labs: CBC  Basename 12/17/11 0830 12/16/11 0555  WBC 15.3* 12.9*  HGB 9.4* 10.2*  HCT 29.1* 31.1*  PLT 268 287   BMET  Basename 12/17/11 0830 12/16/11 0555  NA 136 139  K 3.5 3.7  CL 110 112  CO2 17* 16*  GLUCOSE 95 69*  BUN 5* 5*  CREATININE 1.01 0.97  CALCIUM 8.2* 8.2*   LFT  Basename 12/15/11 1818  PROT 7.7  ALBUMIN 3.1*  AST 35  ALT 42*  ALKPHOS 107  BILITOT 0.3  BILIDIR --  IBILI --  LIPASE 44   PT/INR No results found for this basename: LABPROT:2,INR:2 in the last 72 hours ABG No results found for this basename: PHART:2,PCO2:2,PO2:2,HCO3:2 in the last 72 hours  Studies/Results: Dg Abd 1 View  12/17/2011  *RADIOLOGY REPORT*  Clinical Data: Small bowel obstruction. Chron's disease with right abdominal pain  ABDOMEN - 1 VIEW  Comparison: 12/15/2011 and CT 12/10/2011  Findings: Surgical clips are identified throughout the abdomen with a surgical suture line is seen in the left mid  abdomen.  Mild distention of a focal small bowel loop is seen in the left mid abdomen.  Colonic air in the transverse and proximal descending portions of the colon with no distal colonic air is seen.  An increase in the degree of proximal colonic air is identified in comparison with the recent KUB and the appearance of mildly dilated small bowel loop suggest the possibility of interval worsening in the small bowel obstructive pattern.  No free intraperitoneal air is suggested and bony structures are stable.  IMPRESSION: Increasing colonic air and new focal small bowel loop distention suspicious for worsening small bowel obstruction  Original Report Authenticated By: Bertha Stakes, M.D.   Dg Abd Acute W/chest  12/15/2011  *RADIOLOGY REPORT*  Clinical Data: Crohn's disease  ACUTE ABDOMEN SERIES (ABDOMEN 2 VIEW & CHEST 1 VIEW)  Comparison: Plain film 12/12/2011  Findings: Large hiatal hernia noted.  Chronic scarring at the right lung base.  No free air beneath hemidiaphragms.  There is interval reduction in the dilated loops of bowel seen on comparison exam.  No evidence of gas in the rectum on prior study. Multiple surgical clips noted in the right lower quadrant. Cholecystectomy clips in the right upper quadrant.  IMPRESSION:  1.  Large hiatal hernia unchanged. 2.  Interval improvement in small bowel obstruction pattern compared to prior. 3.  No evidence of intraperitoneal free air.  Original Report Authenticated By: Genevive Bi, M.D.    Assessment: Principal Problem:  *Nausea vomiting and diarrhea, chronic Active Problems:  Leukocytosis  Crohn's disease  Abdominal pain Hx of multiple abdominal operations  Hiatal Hernia without obstruction  Plan: Clinically, Danielle [name] does not have a bowel obstruction. She is on reg diet without issue today. She does have HH, which she was recommended to follow up with Dr. Michaell Cowing for possible elective repair, though she is currently on a treatment plan per  Dr. Karilyn Cota. No surgical indications at this time, call if needed.  LOS: 2 days    Danielle [name] 12/17/2011

## 2011-12-18 MED ORDER — PANTOPRAZOLE SODIUM 40 MG PO TBEC: 40.0000 mg | DELAYED_RELEASE_TABLET | Freq: Every day | ORAL | Status: DC

## 2011-12-18 MED ORDER — CALCIUM-VITAMIN D 600-200 MG-UNIT PO TABS: 1.0000 | ORAL_TABLET | Freq: Every day | ORAL | Status: DC

## 2011-12-18 MED ORDER — SODIUM BICARBONATE 650 MG PO TABS: 650.0000 mg | ORAL_TABLET | Freq: Two times a day (BID) | ORAL | Status: DC

## 2011-12-18 MED ORDER — PROMETHAZINE HCL 25 MG PO TABS: 25.0000 mg | ORAL_TABLET | Freq: Four times a day (QID) | ORAL | Status: DC | PRN

## 2011-12-18 MED ORDER — SPIRONOLACTONE 50 MG PO TABS: 50.0000 mg | ORAL_TABLET | Freq: Every day | ORAL | Status: DC

## 2011-12-18 MED ORDER — NYSTATIN-TRIAMCINOLONE 100000-0.1 UNIT/GM-% EX CREA: TOPICAL_CREAM | Freq: Four times a day (QID) | CUTANEOUS | Status: DC

## 2011-12-18 MED ORDER — DICYCLOMINE HCL 10 MG/5ML PO SOLN: 20.0000 mg | Freq: Three times a day (TID) | ORAL | Status: DC

## 2011-12-18 MED ORDER — HYDROCODONE-ACETAMINOPHEN 7.5-500 MG PO TABS: 1.0000 | ORAL_TABLET | Freq: Three times a day (TID) | ORAL | Status: DC | PRN

## 2011-12-18 MED ORDER — ZOLPIDEM TARTRATE 10 MG PO TABS: 10.0000 mg | ORAL_TABLET | ORAL | Status: DC | PRN

## 2011-12-18 MED ORDER — MULTIVITAMINS PO TABS: 1.0000 | ORAL_TABLET | Freq: Every day | ORAL | Status: DC

## 2011-12-18 MED ORDER — METRONIDAZOLE 500 MG PO TABS: 500.0000 mg | ORAL_TABLET | Freq: Three times a day (TID) | ORAL | Status: AC

## 2011-12-18 NOTE — ED Provider Notes (Signed)
Medical screening examination/treatment/procedure(s) were performed by non-physician practitioner and as supervising physician I was immediately available for consultation/collaboration.   Geoffery Lyons, MD 12/18/11 [phone]

## 2011-12-18 NOTE — Discharge Summary (Signed)
Patient ID: Danielle [name] MRN: [id number] DOB/AGE: 65-Dec-1947 65 y.o.  Admit date: 12/15/2011 Discharge date: 12/18/2011  Primary Care Physician:  [name], MD, MD  Discharge Diagnoses:    Present on Admission:  .Abdominal pain .Nausea vomiting and diarrhea .Crohn's disease .Leukocytosis  Principal Problem:  *Nausea vomiting and diarrhea Active Problems:  Leukocytosis  Crohn's disease  Abdominal pain   Current Discharge Medication List    START taking these medications   Details  metroNIDAZOLE (FLAGYL) 500 MG tablet Take 1 tablet (500 mg total) by mouth 3 (three) times daily. Qty: 30 tablet, Refills: 0      CONTINUE these medications which have CHANGED   Details  Calcium-Vitamin D 600-200 MG-UNIT per tablet Take 1 tablet by mouth daily. Qty: 30 tablet, Refills: 3    dicyclomine (BENTYL) 10 MG/5ML syrup Take 10 mLs (20 mg total) by mouth 4 (four) times daily -  before meals and at bedtime. Qty: 240 mL, Refills: 3    HYDROcodone-acetaminophen (LORTAB) 7.5-500 MG per tablet Take 1 tablet by mouth every 8 (eight) hours as needed. For pain. Qty: 45 tablet, Refills: 0    multivitamin (THERAGRAN) per tablet Take 1 tablet by mouth daily. Qty: 30 tablet, Refills: 3    nystatin-triamcinolone (MYCOLOG II) cream Apply topically 4 (four) times daily. Qty: 30 g, Refills: 0    pantoprazole (PROTONIX) 40 MG tablet Take 1 tablet (40 mg total) by mouth daily. Qty: 30 tablet, Refills: 5    promethazine (PHENERGAN) 25 MG tablet Take 1 tablet (25 mg total) by mouth every 6 (six) hours as needed. Nausea Qty: 30 tablet, Refills: 0    sodium bicarbonate 650 MG tablet Take 1 tablet (650 mg total) by mouth 2 (two) times daily. Qty: 60 tablet, Refills: 0    spironolactone (ALDACTONE) 50 MG tablet Take 1 tablet (50 mg total) by mouth daily. Qty: 30 tablet, Refills: 3    zolpidem (AMBIEN) 10 MG tablet Take 1 tablet (10 mg total) by mouth as needed. insomnia Qty: 30 tablet,  Refills: 0      CONTINUE these medications which have NOT CHANGED   Details  Cyanocobalamin (VITAMIN B-12 IJ) Inject as directed every 30 (thirty) days.      inFLIXimab (REMICADE) 100 MG injection Inject 100 mg into the vein every 6 (six) weeks. Last infusion was in October , next infusion 11-26-11    Probiotic Product (PROBIOTIC FORMULA PO) Take 1 tablet by mouth daily.         Disposition and Follow-up: follow up with GI in 1 week  Consults:   1. Surgery- for small bowel obstruction  Significant Diagnostic Studies:  Dg Abd Acute W/chest  12/15/2011  *RADIOLOGY REPORT*  Clinical Data: Crohn's disease  ACUTE ABDOMEN SERIES (ABDOMEN 2 VIEW & CHEST 1 VIEW)  Comparison: Plain film 12/12/2011  Findings: Large hiatal hernia noted.  Chronic scarring at the right lung base.  No free air beneath hemidiaphragms.  There is interval reduction in the dilated loops of bowel seen on comparison exam.  No evidence of gas in the rectum on prior study. Multiple surgical clips noted in the right lower quadrant. Cholecystectomy clips in the right upper quadrant.  IMPRESSION:  1.  Large hiatal hernia unchanged. 2.  Interval improvement in small bowel obstruction pattern compared to prior. 3.  No evidence of intraperitoneal free air.  Original Report Authenticated By: Genevive Bi, M.D.    Brief H and P: 66 year old female with history of Chron's disease  presented to ED with abdominal pain with nausea, vomiting and diarrhea. She tried taking Phenergan and hydrocodone/acetaminophen without symptomatic relief. She denied hematemesis, hematochezia, mucous or greasy stools. She had no fever or chills. The abdominal pain was crampy, sharp, diffuse, 10 out of 10 in severity and only relieved by IV Dilaudid. She was not able to eat anything at home as she would vomit every time. Repeat an acute abdominal series on admission revealed an interval improvement in her small bowel traction with no evidence of free air and  unchanged large hiatal hernia. CT of the abdomen was not repeated for those reasons. Her white count remains elevated when compared to prior admission. Surgery was consulted for SBO but no intervention was recommended as patient is not clinically obstructed.   Physical Exam on Discharge:  Filed Vitals:   12/17/11 0600 12/17/11 1419 12/17/11 2200 12/18/11 0600  BP: 105/68 107/63 118/75 93/68  Pulse: 103 100 80 86  Temp: 98 F (36.7 C) 98.2 F (36.8 C) 100.4 F (38 C) 98.2 F (36.8 C)  TempSrc: Oral Oral Oral Oral  Resp: 20 20 20 19   Height:      Weight:      SpO2: 96% 97% 98% 98%     Intake/Output Summary (Last 24 hours) at 12/18/11 0741 Last data filed at 12/18/11 0600  Gross per 24 hour  Intake   1140 ml  Output      0 ml  Net   1140 ml    General: Alert, awake, oriented x3, in no acute distress. HEENT: No bruits, no goiter. Heart: Regular rate and rhythm, without murmurs, rubs, gallops. Lungs: Clear to auscultation bilaterally. Abdomen: Soft, nontender, nondistended, positive bowel sounds. Extremities: No clubbing cyanosis or edema with positive pedal pulses. Neuro: Grossly intact, nonfocal.  CBC:    Component Value Date/Time   WBC 15.3* 12/17/2011 0830   HGB 9.4* 12/17/2011 0830   HCT 29.1* 12/17/2011 0830   PLT 268 12/17/2011 0830   MCV 82.2 12/17/2011 0830   NEUTROABS 9.4* 12/10/2011 1557   LYMPHSABS 3.3 12/10/2011 1557   MONOABS 1.2* 12/10/2011 1557   EOSABS 0.1 12/10/2011 1557   BASOSABS 0.0 12/10/2011 1557    Basic Metabolic Panel:    Component Value Date/Time   NA 136 12/17/2011 0830   K 3.5 12/17/2011 0830   CL 110 12/17/2011 0830   CO2 17* 12/17/2011 0830   BUN 5* 12/17/2011 0830   CREATININE 1.01 12/17/2011 0830   GLUCOSE 95 12/17/2011 0830   CALCIUM 8.2* 12/17/2011 0830    Assessment/Plan:   Principal Problem:   *Nausea vomiting and diarrhea  - likely secondary to small bowel obstruction  - we have advanced diet to Clear liquid and then regular as  patient has reported she tolerated it fine  - surgery recommended no intervention as patient has no nausea or vomiting and tolerates solid food well - patient however insisted that even if she needed surgery she would not consent for it as she feels it will make her SBO worse  Active Problems:   Leukocytosis   - patient reports no longer having diarrhea and is C. Diff negative  - unclear etiology but perhaps secondary to Chron's flare up - we will discharge her on 10 day course of Flagyl and she verbalized understanding to follow up with GI doctor  EDUCATION  - test results and diagnostic studies were discussed with patient at the bedside  - patient has verbalized the understanding  - questions were  answered at the bedside and contact information was provided for additional questions or concerns  DISPOSITION - patient appears clinically well to be discharged home  Time spent on Discharge: Greater than 30 minutes   Signed: Dagmar Adcox 12/18/2011, 7:41 AM

## 2011-12-28 ENCOUNTER — Encounter (INDEPENDENT_AMBULATORY_CARE_PROVIDER_SITE_OTHER): Payer: Self-pay | Admitting: Internal Medicine

## 2011-12-28 ENCOUNTER — Encounter (INDEPENDENT_AMBULATORY_CARE_PROVIDER_SITE_OTHER): Payer: Self-pay | Admitting: *Deleted

## 2011-12-28 ENCOUNTER — Ambulatory Visit (INDEPENDENT_AMBULATORY_CARE_PROVIDER_SITE_OTHER): Payer: Medicare Other | Admitting: Internal Medicine

## 2011-12-28 VITALS — BP 106/70 | HR 68 | Temp 97.8°F | Resp 12 | Ht 64.0 in | Wt 150.0 lb

## 2011-12-28 DIAGNOSIS — K5090 Crohn's disease, unspecified, without complications: Secondary | ICD-10-CM

## 2011-12-28 LAB — CBC WITH DIFFERENTIAL/PLATELET
Eosinophils Relative: 1 % (ref 0–5)
HCT: 34 % — ABNORMAL LOW (ref 36.0–46.0)
Hemoglobin: 10.3 g/dL — ABNORMAL LOW (ref 12.0–15.0)
Lymphocytes Relative: 28 % (ref 12–46)
Lymphs Abs: 3.9 10*3/uL (ref 0.7–4.0)
MCV: 85.9 fL (ref 78.0–100.0)
Monocytes Relative: 8 % (ref 3–12)
Platelets: 518 10*3/uL — ABNORMAL HIGH (ref 150–400)
RBC: 3.96 MIL/uL (ref 3.87–5.11)
WBC: 13.8 10*3/uL — ABNORMAL HIGH (ref 4.0–10.5)

## 2011-12-28 NOTE — Progress Notes (Signed)
Subjective:     Patient ID: Danielle [name], female   DOB: Jul 27, 1946, 66 y.o.   MRN: [id number]  HPI  Danielle [name] is a 66 yr old female here today for f/u after recent admission for a flare of Crohn's.  She actually tells me on her second admission they told her she probably had a virus.   Her WBC ct on 12/27/2010 was 15.3. H and H 9.4 and 29.4.   She c/o abdominal pain, vomiting, and diarrhea.  She had tried taking Phenergan and Hydrocodone/acetaminophen without relief.   K on 16/2013 3.5   She underwent CT 12/10/2011 which revealed: There appears to be a focal inflammatory stricture at the junction  of the sigmoid and descending colon with proximal colonic  distension and air-fluid levels. Associated dilatation of the  residual small bowel. Findings are consistent with Crohn's  stricture although neoplasm is not entirely excluded. No intra-  abdominal abscess. Note that there is a large esophageal hiatal  hernia containing most of the stomach. The splenic fracture of the  colon also extends up into the hiatal hernia. Multiple additional  findings as discussed.  12/16/2011 Abdominal xray IMPRESSION:  Increasing colonic air and new focal small bowel loop distention  suspicious for worsening small bowel obstruction   In December she underwent and EGD with Dr. Dorena Cookey of [facility] and noted to have a large hiatal hernia but no evidence of esophagitis, ring, or stricture. She also underwent a flexible sigmoidoscopy which revealed perianal inflammation but no evidence of proctitis or sigmoid disease. She was admitted to [facility] for copious diarrhea, dysphagia, and odynophagia.  It was also noted that her potassium was 2 mEq per liter   and she was treated aggressively. Stool studies were negative.   She tells me she is 90% better. Her appetite is good. She has lost maybe a few pounds. She denies abdominal pain. She is having 3 stools a day. Her stools are formed. She denies rectal bleeding or melena.  She denies any rectal discharge.     Review of Systems see hpi.  Current Outpatient Prescriptions  Medication Sig Dispense Refill  . Calcium-Vitamin D 600-200 MG-UNIT per tablet Take 1 tablet by mouth daily.  30 tablet  3  . Cyanocobalamin (VITAMIN B-12 IJ) Inject as directed every 30 (thirty) days.        [name] dicyclomine (BENTYL) 10 MG/5ML syrup Take 10 mLs (20 mg total) by mouth 4 (four) times daily -  before meals and at bedtime.  240 mL  3  . HYDROcodone-acetaminophen (LORTAB) 7.5-500 MG per tablet Take 1 tablet by mouth every 8 (eight) hours as needed. For pain.  45 tablet  0  . inFLIXimab (REMICADE) 100 MG injection Inject 100 mg into the vein every 6 (six) weeks. Last infusion was in October , next infusion to be 01-07-12      . metroNIDAZOLE (FLAGYL) 500 MG tablet Take 1 tablet (500 mg total) by mouth 3 (three) times daily.  30 tablet  0  . multivitamin (THERAGRAN) per tablet Take 1 tablet by mouth daily.  30 tablet  3  . nystatin-triamcinolone (MYCOLOG II) cream Apply topically 4 (four) times daily.  30 g  0  . pantoprazole (PROTONIX) 40 MG tablet Take 1 tablet (40 mg total) by mouth daily.  30 tablet  5  . POTASSIUM PO Take 40 mEq by mouth 2 (two) times daily.      . Probiotic Product (PROBIOTIC FORMULA PO) Take 1  tablet by mouth daily.       . promethazine (PHENERGAN) 25 MG tablet Take 1 tablet (25 mg total) by mouth every 6 (six) hours as needed. Nausea  30 tablet  0  . spironolactone (ALDACTONE) 50 MG tablet Take 1 tablet (50 mg total) by mouth daily.  30 tablet  3  . zolpidem (AMBIEN) 10 MG tablet Take 1 tablet (10 mg total) by mouth as needed. insomnia  30 tablet  0   Past Medical History  Diagnosis Date  . Crohn's disease   . Chronic diarrhea   . Joint pain   . GERD (gastroesophageal reflux disease)   . Polyarthralgia   . Shortness of breath   . Pneumonia   . Anemia   . Hernia (acquired) (recurrent)     hiatal  . Hiatal hernia   . Back pain    Past Surgical History    Procedure Date  . Bowel resection   . Colostomy     [name]  . Anal stenosis   . Colonoscopy 2000  . Cholecystectomy   . Appendectomy   . Goiter 1999  . Esophagogastroduodenoscopy 11/11/2011    Procedure: ESOPHAGOGASTRODUODENOSCOPY (EGD);  Surgeon: Barrie Folk, MD;  Location: [facility] ENDOSCOPY;  Service: Endoscopy;  Laterality: N/A;  . Flexible sigmoidoscopy 11/11/2011    Procedure: FLEXIBLE SIGMOIDOSCOPY;  Surgeon: Barrie Folk, MD;  Location: [facility] ENDOSCOPY;  Service: Endoscopy;  Laterality: N/A;   History   Social History  . Marital Status: Single    Spouse Name: N/A    Number of Children: N/A  . Years of Education: N/A   Occupational History  . Not on file.   Social History Main Topics  . Smoking status: Former Smoker -- 4 years    Types: Cigarettes    Quit date: 10/05/1996  . Smokeless tobacco: Never Used   Comment: 1 pack every two weeks when she smoked  . Alcohol Use: No  . Drug Use: No  . Sexually Active: Not on file   Other Topics Concern  . Not on file   Social History Narrative  . No narrative on file   Family Status  Relation Status Death Age  . Mother Deceased 35  . Father Deceased 46    Congestive heart failure  . Sister Alive   . Brother Alive   . Brother Deceased 60   Allergies  Allergen Reactions  . Aspirin Other (See Comments)    Clamps her stomach  . Morphine And Related Itching  . Sulfa Antibiotics Nausea And Vomiting       Objective:   Physical Exam Filed Vitals:   12/28/11 0914  Height: 5\' 4"  (1.626 m)  Weight: 150 lb (68.04 kg)    Alert and oriented. Skin warm and dry. Oral mucosa is moist.   . Sclera anicteric, conjunctivae is pink. Thyroid not enlarged. No cervical lymphadenopathy. Lungs clear. Heart regular rate and rhythm.  Abdomen is soft. Bowel sounds are positive. No hepatomegaly. No abdominal masses felt. No tenderness.  No edema to lower extremities. Patient is alert and oriented.      Assessment:    Crohn's.  She says  she feels better. (90% better).     Plan:     Will repeat a CBC, CRP and get a potassium on her today. Will also give Rx for Hydrocodone #60 with no refills. Must last 30 days.

## 2011-12-28 NOTE — Patient Instructions (Addendum)
Will obtain a CBC, K  and CRP on her today. She will finish the Cipro and Flagyl. OV in 3 months. Rx for Hydrocodone-Acet. 7.5-500 given to patient. Must last 30 days.  I discussed Danielle [name] with Dr. Karilyn Cota.

## 2012-01-07 ENCOUNTER — Encounter (HOSPITAL_COMMUNITY): Payer: Medicare Other | Attending: Oncology

## 2012-01-07 VITALS — Ht 64.0 in | Wt 156.5 lb

## 2012-01-07 DIAGNOSIS — K5090 Crohn's disease, unspecified, without complications: Secondary | ICD-10-CM | POA: Diagnosis not present

## 2012-01-07 MED ORDER — ACETAMINOPHEN 325 MG PO TABS
650.0000 mg | ORAL_TABLET | Freq: Once | ORAL | Status: AC
  Administered 2012-01-07: 500 mg via ORAL

## 2012-01-07 MED ORDER — LORATADINE 10 MG PO TABS
10.0000 mg | ORAL_TABLET | Freq: Once | ORAL | Status: AC
  Administered 2012-01-07: 10 mg via ORAL

## 2012-01-07 MED ORDER — SODIUM CHLORIDE 0.9 % IJ SOLN
10.0000 mL | Freq: Once | INTRAMUSCULAR | Status: AC
  Administered 2012-01-07: 10 mL via INTRAVENOUS

## 2012-01-07 MED ORDER — SODIUM CHLORIDE 0.9 % IV SOLN
5.0000 mg/kg | Freq: Once | INTRAVENOUS | Status: AC
  Administered 2012-01-07: 400 mg via INTRAVENOUS
  Filled 2012-01-07: qty 40

## 2012-01-07 MED ORDER — SODIUM CHLORIDE 0.9 % IV SOLN
Freq: Once | INTRAVENOUS | Status: AC
  Administered 2012-01-07: 12:00:00 via INTRAVENOUS

## 2012-01-07 NOTE — Progress Notes (Signed)
Tolerated well

## 2012-01-14 DIAGNOSIS — D510 Vitamin B12 deficiency anemia due to intrinsic factor deficiency: Secondary | ICD-10-CM | POA: Diagnosis not present

## 2012-01-18 ENCOUNTER — Ambulatory Visit (INDEPENDENT_AMBULATORY_CARE_PROVIDER_SITE_OTHER): Payer: Medicare Other | Admitting: Internal Medicine

## 2012-01-22 DIAGNOSIS — H2510 Age-related nuclear cataract, unspecified eye: Secondary | ICD-10-CM | POA: Diagnosis not present

## 2012-01-27 ENCOUNTER — Telehealth (INDEPENDENT_AMBULATORY_CARE_PROVIDER_SITE_OTHER): Payer: Self-pay | Admitting: *Deleted

## 2012-01-27 NOTE — Telephone Encounter (Signed)
   Danielle [name] called this morning requested a refill on her Hydrocodone 7.5/500 mg Takes 1 by mouth three times daily. Last filled in December 15 2011 for #40  [facility] [place] [facility] [address] phone number is [phone].

## 2012-02-04 ENCOUNTER — Telehealth (INDEPENDENT_AMBULATORY_CARE_PROVIDER_SITE_OTHER): Payer: Self-pay | Admitting: *Deleted

## 2012-02-04 DIAGNOSIS — K5090 Crohn's disease, unspecified, without complications: Secondary | ICD-10-CM

## 2012-02-04 MED ORDER — DICYCLOMINE HCL 10 MG PO CAPS: 10.0000 mg | ORAL_CAPSULE | Freq: Three times a day (TID) | ORAL | Status: DC

## 2012-02-04 NOTE — Telephone Encounter (Signed)
Rx e-prescribed to [facility]

## 2012-02-04 NOTE — Telephone Encounter (Signed)
Patient called and has ask for the following medications to be [facility] [phone] or fax [phone]. Danielle [name] states that she has edema in her lower extremities and would like Lasix 40 mg to help with this, also she needs refill on Bentyl 10 mg but pharmacy told her that she would need to have it prescribed as a gel capsule,liquid not being made or is hard to get now. She uses this ac meals and at bedtime. Patient may be called at [phone]

## 2012-02-04 NOTE — Telephone Encounter (Signed)
Addended by: Len Blalock on: 02/04/2012 10:45 AM   Modules accepted: Orders

## 2012-02-04 NOTE — Telephone Encounter (Signed)
Patient called and has ask for the following medications to be called into [facility] [place] [phone] or fax [phone]. Sunni states that she has edema in her lower extremities and would like Lasix 40 mg to help with this, also she needs refill on Bentyl 10 mg but pharmacy told her that she would need to have it prescribed as a gel capsule,liquid not being made or is hard to get now. She uses this ac meals and at bedtime. Patient may be called at [phone]  

## 2012-02-09 ENCOUNTER — Other Ambulatory Visit (INDEPENDENT_AMBULATORY_CARE_PROVIDER_SITE_OTHER): Payer: Self-pay | Admitting: *Deleted

## 2012-02-09 ENCOUNTER — Other Ambulatory Visit (INDEPENDENT_AMBULATORY_CARE_PROVIDER_SITE_OTHER): Payer: Self-pay | Admitting: Internal Medicine

## 2012-02-09 MED ORDER — HYDROCODONE-ACETAMINOPHEN 7.5-750 MG PO TABS: 1.0000 | ORAL_TABLET | Freq: Four times a day (QID) | ORAL | Status: DC | PRN

## 2012-02-09 NOTE — Telephone Encounter (Signed)
Need chart

## 2012-02-09 NOTE — Telephone Encounter (Signed)
[facility] for Hydrocodon-Acetaminoph 7.5-500, take 1 tablet 3 times a day as needed for pain. [facility] [place] at [phone].

## 2012-02-09 NOTE — Telephone Encounter (Signed)
For Dr. Rehman 

## 2012-02-18 ENCOUNTER — Encounter (HOSPITAL_COMMUNITY): Payer: Medicare Other | Attending: Oncology

## 2012-02-18 ENCOUNTER — Other Ambulatory Visit: Payer: Self-pay | Admitting: Family Medicine

## 2012-02-18 VITALS — BP 97/69 | HR 102 | Temp 98.7°F | Wt 152.6 lb

## 2012-02-18 DIAGNOSIS — K5090 Crohn's disease, unspecified, without complications: Secondary | ICD-10-CM

## 2012-02-18 DIAGNOSIS — R609 Edema, unspecified: Secondary | ICD-10-CM

## 2012-02-18 MED ORDER — SODIUM CHLORIDE 0.9 % IV SOLN
5.0000 mg/kg | Freq: Once | INTRAVENOUS | Status: AC
  Administered 2012-02-18: 300 mg via INTRAVENOUS
  Filled 2012-02-18: qty 30

## 2012-02-18 MED ORDER — LORATADINE 10 MG PO TABS
10.0000 mg | ORAL_TABLET | Freq: Every day | ORAL | Status: DC
  Administered 2012-02-18: 10 mg via ORAL

## 2012-02-18 MED ORDER — ACETAMINOPHEN 325 MG PO TABS
650.0000 mg | ORAL_TABLET | Freq: Once | ORAL | Status: AC
  Administered 2012-02-18: 650 mg via ORAL

## 2012-02-18 NOTE — Progress Notes (Signed)
Tolerated infusion well. 

## 2012-02-19 ENCOUNTER — Other Ambulatory Visit: Payer: Medicare Other

## 2012-02-26 ENCOUNTER — Other Ambulatory Visit: Payer: Medicare Other

## 2012-03-02 ENCOUNTER — Telehealth (INDEPENDENT_AMBULATORY_CARE_PROVIDER_SITE_OTHER): Payer: Self-pay | Admitting: *Deleted

## 2012-03-02 ENCOUNTER — Other Ambulatory Visit (INDEPENDENT_AMBULATORY_CARE_PROVIDER_SITE_OTHER): Payer: Self-pay | Admitting: Internal Medicine

## 2012-03-02 DIAGNOSIS — K5090 Crohn's disease, unspecified, without complications: Secondary | ICD-10-CM | POA: Diagnosis not present

## 2012-03-02 NOTE — Telephone Encounter (Signed)
Per Dr. Karilyn Cota the patient will need a B-Met . Lab noted and patient called and made aware

## 2012-03-02 NOTE — Telephone Encounter (Signed)
Patient needs metabolic 7

## 2012-03-02 NOTE — Telephone Encounter (Signed)
Lab noted and patient called and made aware .

## 2012-03-04 ENCOUNTER — Other Ambulatory Visit: Payer: Medicare Other

## 2012-03-10 ENCOUNTER — Other Ambulatory Visit (INDEPENDENT_AMBULATORY_CARE_PROVIDER_SITE_OTHER): Payer: Self-pay | Admitting: *Deleted

## 2012-03-10 NOTE — Telephone Encounter (Signed)
Patient called and states that she needs a new prescription called in for Phenergan Suppositories , as the previous one had expired. Per Delrae Rend may call in the following: Phenergan Suppository 25 mg - Insert one per rectal as needed for nausea and vomiting #30 with 2 refills this [facility] [place]. Patient was made aware.

## 2012-03-15 ENCOUNTER — Encounter (INDEPENDENT_AMBULATORY_CARE_PROVIDER_SITE_OTHER): Payer: Self-pay | Admitting: General Surgery

## 2012-03-15 ENCOUNTER — Ambulatory Visit (INDEPENDENT_AMBULATORY_CARE_PROVIDER_SITE_OTHER): Payer: Medicare Other | Admitting: General Surgery

## 2012-03-15 VITALS — BP 114/68 | HR 72 | Temp 97.8°F | Resp 16 | Ht 64.5 in | Wt 158.2 lb

## 2012-03-15 DIAGNOSIS — K612 Anorectal abscess: Secondary | ICD-10-CM

## 2012-03-15 DIAGNOSIS — K611 Rectal abscess: Secondary | ICD-10-CM

## 2012-03-15 DIAGNOSIS — K5090 Crohn's disease, unspecified, without complications: Secondary | ICD-10-CM

## 2012-03-15 NOTE — Progress Notes (Signed)
Patient ID: Danielle [name], female   DOB: 04/17/1946, 66 y.o.   MRN: [id number]  Chief Complaint  Patient presents with  . Follow-up    Eval possible rectal abscess. Severe pain & swelling, redness.    HPI Danielle [name] is a 67 y.o. female.  She is referred to me by Dr. Mirna Mires for evaluation of a perirectal abscess.  This patient has a complex and long-standing history of Crohn's disease. She says that Dr. Gabriel Cirri has drained several perirectal abscesses in the past and has been on multiple of bowel operations. Dr. Michaell Cowing has seen her in the past. Dr. Luisa Hart has seen her in the past. She is managed by Dr. Karilyn Cota in [place]. She lives in [place] now.  She presents today with a 5 day history of painful swelling in the perianal area. She notices that she is incontinent of flatus. This is chronic. HPI  Past Medical History  Diagnosis Date  . Crohn's disease   . Chronic diarrhea   . Joint pain   . GERD (gastroesophageal reflux disease)   . Polyarthralgia   . Shortness of breath   . Pneumonia   . Anemia   . Hernia (acquired) (recurrent)     hiatal  . Hiatal hernia   . Back pain     Past Surgical History  Procedure Date  . Bowel resection   . Colostomy     [name]  . Anal stenosis   . Colonoscopy 2000  . Cholecystectomy   . Appendectomy   . Goiter 1999  . Esophagogastroduodenoscopy 11/11/2011    Procedure: ESOPHAGOGASTRODUODENOSCOPY (EGD);  Surgeon: Barrie Folk, MD;  [facility] ENDOSCOPY;  Service: Endoscopy;  Laterality: N/A;  . Flexible sigmoidoscopy 11/11/2011    Procedure: FLEXIBLE SIGMOIDOSCOPY;  Surgeon: Barrie Folk, MD;  Location: [facility] ENDOSCOPY;  Service: Endoscopy;  Laterality: N/A;    Family History  Problem Relation Age of Onset  . Diabetes Mother   . Healthy Sister   . Hypertension Brother   . Colon cancer Brother     Social History History  Substance Use Topics  . Smoking status: Former Smoker -- 4 years    Types: Cigarettes    Quit date:  10/05/1996  . Smokeless tobacco: Never Used   Comment: 1 pack every two weeks when she smoked  . Alcohol Use: No    Allergies  Allergen Reactions  . Aspirin Other (See Comments)    Clamps her stomach  . Morphine And Related Itching  . Sulfa Antibiotics Nausea And Vomiting    Current Outpatient Prescriptions  Medication Sig Dispense Refill  . Calcium-Vitamin D 600-200 MG-UNIT per tablet Take 1 tablet by mouth daily.  30 tablet  3  . Cyanocobalamin (VITAMIN B-12 IJ) Inject as directed every 30 (thirty) days.        [name] dicyclomine (BENTYL) 10 MG capsule Take 1 capsule (10 mg total) by mouth 3 (three) times daily.  90 capsule  3  . HYDROcodone-acetaminophen (LORTAB) 7.5-500 MG per tablet Take 1 tablet by mouth every 8 (eight) hours as needed. For pain.  45 tablet  0  . HYDROcodone-acetaminophen (VICODIN ES) 7.5-750 MG per tablet Take 1 tablet by mouth every 6 (six) hours as needed for pain.  20 tablet  0  . inFLIXimab (REMICADE) 100 MG injection Inject 100 mg into the vein every 6 (six) weeks. Last infusion was in October , next infusion to be 01-07-12      . multivitamin [facility]) per tablet  Take 1 tablet by mouth daily.  30 tablet  3  . nystatin-triamcinolone (MYCOLOG II) cream APPLY TOPICALLY 4 TIMES A DAY  30 g  0  . pantoprazole (PROTONIX) 40 MG tablet Take 1 tablet (40 mg total) by mouth daily.  30 tablet  5  . POTASSIUM PO Take 40 mEq by mouth 2 (two) times daily.      . Probiotic Product (PROBIOTIC FORMULA PO) Take 1 tablet by mouth daily.       . promethazine (PHENERGAN) 25 MG tablet Take 1 tablet (25 mg total) by mouth every 6 (six) hours as needed. Nausea  30 tablet  0  . spironolactone (ALDACTONE) 50 MG tablet Take 1 tablet (50 mg total) by mouth daily.  30 tablet  3  . spironolactone (ALDACTONE) 50 MG tablet TAKE 1 TABLET (50 MG TOTAL) BY MOUTH DAILY.  30 tablet  2  . zolpidem (AMBIEN) 10 MG tablet Take 1 tablet (10 mg total) by mouth as needed. insomnia  30 tablet  0  .  DISCONTD: spironolactone (ALDACTONE) 50 MG tablet Take 1 tablet (50 mg total) by mouth daily.  30 tablet  2    Review of Systems Review of Systems  Constitutional: Negative for fever, chills and unexpected weight change.  HENT: Negative for hearing loss, congestion, sore throat, trouble swallowing and voice change.   Eyes: Negative for visual disturbance.  Respiratory: Negative for cough and wheezing.   Cardiovascular: Negative for chest pain, palpitations and leg swelling.  Gastrointestinal: Positive for nausea, abdominal pain, diarrhea and rectal pain. Negative for vomiting, constipation, blood in stool, abdominal distention and anal bleeding.  Genitourinary: Negative for hematuria, vaginal bleeding and difficulty urinating.  Musculoskeletal: Negative for arthralgias.  Skin: Negative for rash and wound.  Neurological: Negative for seizures, syncope and headaches.  Hematological: Negative for adenopathy. Does not bruise/bleed easily.  Psychiatric/Behavioral: Negative for confusion.    Blood pressure 114/68, pulse 72, temperature 97.8 F (36.6 C), temperature source Temporal, resp. rate 16, height 5' 4.5" (1.638 m), weight 158 lb 4 oz (71.782 kg).  Physical Exam Physical Exam  Constitutional: She appears well-developed and well-nourished. She appears distressed.  HENT:  Head: Normocephalic and atraumatic.  Cardiovascular: Normal rate, regular rhythm and normal heart sounds.   No murmur heard. Pulmonary/Chest: Effort normal and breath sounds normal. No respiratory distress. She has no wheezes. She has no rales. She exhibits no tenderness.  Abdominal: Soft. Bowel sounds are normal. She exhibits no distension. There is no tenderness.  Genitourinary:       Exam of the perianal area reveals multiple scars and chronic tissue thickening consistent with severe Crohn's disease and multiple procedures. Purulent material is emanating from the anal canal. There is a tender mass in the right  posterior position.  Following alcohol prep and 1% Xylocaine with epinephrine local I performed incision and drainage of the right posterior abscess with a radially oriented incision. This was a large foul smelling abscess. I evacuated this and packed it with 1 inch iodoform gauze. She tolerated this amazingly well.  Skin: Skin is warm and dry. No rash noted. She is not diaphoretic. There is erythema. No pallor.  Psychiatric: She has a normal mood and affect. Her behavior is normal. Thought content normal.    Data Reviewed Old office and hospital records  Assessment    Recurrent perirectal abscess, right posterior  Crohn's disease. Status post multiple abdominal operations and multiple  perirectal abscesses.  Hiatal hernia.  History small bowel obstructions  Plan    Incision and drainage of perirectal abscess performed in office today. This was a deep fascia rectal abscess. She tolerated this well.  One total best 4 times daily  Remove packing in 48 hours  Return to office this Friday  Augmentin 875 mg twice daily for 14 days       Avaley Coop M. Derrell Lolling, M.D., [facility] General and Minimally invasive Surgery Breast and Colorectal Surgery Office:   [phone] Pager:   [phone]  03/15/2012, 5:26 PM

## 2012-03-15 NOTE — Patient Instructions (Signed)
You had a another perirectal abscess. We were able to drain this.  Take warm tub baths 3 or 4 times a day.  Remove the packing on Thursday.  Take the antibiotics until they are completely gone.  You will be given an appointment to see [place] in the office on Friday to check and make sure you are getting better.

## 2012-03-16 ENCOUNTER — Other Ambulatory Visit (INDEPENDENT_AMBULATORY_CARE_PROVIDER_SITE_OTHER): Payer: Self-pay | Admitting: *Deleted

## 2012-03-16 NOTE — Telephone Encounter (Signed)
Patient called and states that on 03-15-12 she went to the surgical center and had an abcess lanced. She was requesting refill on her Mycolog ll Cream  - apply topically twice daily 30 grams 6 [facility] per Dr. Patty Sermons approval.  Patient has an appointment with Dorene Ar, NP on 03-28-12, she stated at that time she  would like to also discuss Remicade and increasing her dose.Next Remicade is posted for 03-31-12.

## 2012-03-18 ENCOUNTER — Encounter (INDEPENDENT_AMBULATORY_CARE_PROVIDER_SITE_OTHER): Payer: Self-pay | Admitting: Surgery

## 2012-03-18 ENCOUNTER — Ambulatory Visit (INDEPENDENT_AMBULATORY_CARE_PROVIDER_SITE_OTHER): Payer: Medicare Other | Admitting: Surgery

## 2012-03-18 VITALS — BP 120/72 | HR 97 | Temp 97.7°F | Ht 64.5 in | Wt 159.2 lb

## 2012-03-18 DIAGNOSIS — K612 Anorectal abscess: Secondary | ICD-10-CM

## 2012-03-18 DIAGNOSIS — K611 Rectal abscess: Secondary | ICD-10-CM

## 2012-03-18 NOTE — Progress Notes (Addendum)
[facility]  Ovidio Kin, MD,  FACS [address]  [address] [place], [place]    [address] Phone:  [phone] FAX:  [phone]   Re:   Danielle [name] DOB:   [birth year] MRN:   [id number]  ASSESSMENT AND PLAN: 1.  Perirectal abscess drained by Dr. Derrell Lolling - 03/15/2012  Right posterior area.  The wound is draining some purulence, but there is no acute cellulitis.  To continue local wound care.  See Dr. Derrell Lolling back in 2 - 3 weeks.  2.  Chronic/recurrent perirectal disease 3.  Crohn's disease - followed by Dr. Karilyn Cota (has seen Dr. Madilyn Fireman in [place])  On Remicade at this time. 4.  I gave her Diflucan - 150 mg tab - for vaginal infection.    HISTORY OF PRESENT ILLNESS: Chief Complaint  Patient presents with  . Follow-up    reck rectal abscess    Danielle [name] is a 66 y.o. (DOB: February 09, 1946)  AA female who is a patient of Danielle K, MD, MD and comes to me today for follow up of perirectal abscess.  She is doing much better since the drainage this past Monday.  She is having no fever, minimal discomfort of the rectum. She has some chronic issues with her peri-rectal area.   PHYSICAL EXAM: BP 120/72  Pulse 97  Temp(Src) 97.7 F (36.5 C) (Temporal)  Ht 5' 4.5" (1.638 m)  Wt 159 lb 3.2 oz (72.213 kg)  BMI 26.90 kg/m2  SpO2 97%  Rectum:  Right posterior I&D wound draining.  Deep scar at right lateral rectum.  She has other peri anal changes consistent with Crohn's.  DATA REVIEWED: Dr. Jacinto Halim notes.  Ovidio Kin, MD, FACS Office:  [phone]

## 2012-03-22 DIAGNOSIS — K5090 Crohn's disease, unspecified, without complications: Secondary | ICD-10-CM | POA: Diagnosis not present

## 2012-03-22 LAB — BASIC METABOLIC PANEL
BUN: 8 mg/dL (ref 6–23)
CO2: 18 mEq/L — ABNORMAL LOW (ref 19–32)
Glucose, Bld: 84 mg/dL (ref 70–99)
Potassium: 3.8 mEq/L (ref 3.5–5.3)
Sodium: 137 mEq/L (ref 135–145)

## 2012-03-25 ENCOUNTER — Inpatient Hospital Stay (HOSPITAL_COMMUNITY)
Admission: AD | Admit: 2012-03-25 | Discharge: 2012-04-01 | DRG: 386 | Disposition: A | Discharge status: Home / Self Care | Payer: Medicare Other | Attending: Internal Medicine | Admitting: Internal Medicine

## 2012-03-25 ENCOUNTER — Encounter (HOSPITAL_COMMUNITY): Payer: Self-pay | Admitting: Emergency Medicine

## 2012-03-25 ENCOUNTER — Emergency Department (HOSPITAL_COMMUNITY): Payer: Medicare Other

## 2012-03-25 DIAGNOSIS — E876 Hypokalemia: Secondary | ICD-10-CM

## 2012-03-25 DIAGNOSIS — R197 Diarrhea, unspecified: Secondary | ICD-10-CM | POA: Diagnosis not present

## 2012-03-25 DIAGNOSIS — K219 Gastro-esophageal reflux disease without esophagitis: Secondary | ICD-10-CM

## 2012-03-25 DIAGNOSIS — K5000 Crohn's disease of small intestine without complications: Secondary | ICD-10-CM | POA: Diagnosis not present

## 2012-03-25 DIAGNOSIS — D75839 Thrombocytosis, unspecified: Secondary | ICD-10-CM

## 2012-03-25 DIAGNOSIS — R1084 Generalized abdominal pain: Secondary | ICD-10-CM | POA: Diagnosis not present

## 2012-03-25 DIAGNOSIS — R7989 Other specified abnormal findings of blood chemistry: Secondary | ICD-10-CM

## 2012-03-25 DIAGNOSIS — K449 Diaphragmatic hernia without obstruction or gangrene: Secondary | ICD-10-CM | POA: Diagnosis not present

## 2012-03-25 DIAGNOSIS — D649 Anemia, unspecified: Secondary | ICD-10-CM

## 2012-03-25 DIAGNOSIS — J189 Pneumonia, unspecified organism: Secondary | ICD-10-CM

## 2012-03-25 DIAGNOSIS — K5090 Crohn's disease, unspecified, without complications: Secondary | ICD-10-CM | POA: Diagnosis not present

## 2012-03-25 DIAGNOSIS — R531 Weakness: Secondary | ICD-10-CM

## 2012-03-25 DIAGNOSIS — R432 Parageusia: Secondary | ICD-10-CM

## 2012-03-25 DIAGNOSIS — R110 Nausea: Secondary | ICD-10-CM | POA: Diagnosis not present

## 2012-03-25 DIAGNOSIS — D473 Essential (hemorrhagic) thrombocythemia: Secondary | ICD-10-CM

## 2012-03-25 DIAGNOSIS — N179 Acute kidney failure, unspecified: Secondary | ICD-10-CM | POA: Diagnosis present

## 2012-03-25 DIAGNOSIS — R079 Chest pain, unspecified: Secondary | ICD-10-CM | POA: Diagnosis not present

## 2012-03-25 DIAGNOSIS — N390 Urinary tract infection, site not specified: Secondary | ICD-10-CM

## 2012-03-25 DIAGNOSIS — K5080 Crohn's disease of both small and large intestine without complications: Secondary | ICD-10-CM | POA: Diagnosis not present

## 2012-03-25 DIAGNOSIS — K56609 Unspecified intestinal obstruction, unspecified as to partial versus complete obstruction: Secondary | ICD-10-CM

## 2012-03-25 DIAGNOSIS — D72829 Elevated white blood cell count, unspecified: Secondary | ICD-10-CM | POA: Diagnosis present

## 2012-03-25 DIAGNOSIS — I959 Hypotension, unspecified: Secondary | ICD-10-CM | POA: Diagnosis not present

## 2012-03-25 DIAGNOSIS — R933 Abnormal findings on diagnostic imaging of other parts of digestive tract: Secondary | ICD-10-CM | POA: Diagnosis not present

## 2012-03-25 DIAGNOSIS — R112 Nausea with vomiting, unspecified: Secondary | ICD-10-CM

## 2012-03-25 DIAGNOSIS — E871 Hypo-osmolality and hyponatremia: Secondary | ICD-10-CM

## 2012-03-25 DIAGNOSIS — M549 Dorsalgia, unspecified: Secondary | ICD-10-CM

## 2012-03-25 DIAGNOSIS — R109 Unspecified abdominal pain: Secondary | ICD-10-CM | POA: Diagnosis not present

## 2012-03-25 DIAGNOSIS — R0789 Other chest pain: Secondary | ICD-10-CM | POA: Diagnosis not present

## 2012-03-25 DIAGNOSIS — K611 Rectal abscess: Secondary | ICD-10-CM

## 2012-03-25 LAB — POCT I-STAT, CHEM 8
BUN: 16 mg/dL (ref 6–23)
Calcium, Ion: 1.21 mmol/L (ref 1.12–1.32)
Chloride: 112 mEq/L (ref 96–112)
Creatinine, Ser: 1.3 mg/dL — ABNORMAL HIGH (ref 0.50–1.10)
Glucose, Bld: 90 mg/dL (ref 70–99)
HCT: 34 % — ABNORMAL LOW (ref 36.0–46.0)
Hemoglobin: 11.6 g/dL — ABNORMAL LOW (ref 12.0–15.0)
Potassium: 3.6 mEq/L (ref 3.5–5.1)
Sodium: 138 mEq/L (ref 135–145)
TCO2: 15 mmol/L (ref 0–100)

## 2012-03-25 LAB — DIFFERENTIAL
Basophils Absolute: 0 10*3/uL (ref 0.0–0.1)
Basophils Relative: 0 % (ref 0–1)
Eosinophils Relative: 1 % (ref 0–5)
Monocytes Absolute: 1.2 10*3/uL — ABNORMAL HIGH (ref 0.1–1.0)
Neutro Abs: 11.3 10*3/uL — ABNORMAL HIGH (ref 1.7–7.7)

## 2012-03-25 LAB — POCT I-STAT TROPONIN I: Troponin i, poc: 0.01 ng/mL (ref 0.00–0.08)

## 2012-03-25 LAB — CBC
HCT: 32.2 % — ABNORMAL LOW (ref 36.0–46.0)
MCHC: 33.2 g/dL (ref 30.0–36.0)
Platelets: 440 10*3/uL — ABNORMAL HIGH (ref 150–400)
RDW: 14.1 % (ref 11.5–15.5)
RDW: 14.2 % (ref 11.5–15.5)
WBC: 16.5 10*3/uL — ABNORMAL HIGH (ref 4.0–10.5)

## 2012-03-25 LAB — CREATININE, SERUM
Creatinine, Ser: 1.17 mg/dL — ABNORMAL HIGH (ref 0.50–1.10)
GFR calc Af Amer: 56 mL/min — ABNORMAL LOW (ref >90)
GFR calc non Af Amer: 48 mL/min — ABNORMAL LOW (ref >90)

## 2012-03-25 LAB — URINALYSIS, ROUTINE W REFLEX MICROSCOPIC
Bilirubin Urine: NEGATIVE
Glucose, UA: NEGATIVE mg/dL
Ketones, ur: NEGATIVE mg/dL
Nitrite: NEGATIVE
Protein, ur: NEGATIVE mg/dL
Specific Gravity, Urine: 1.015 (ref 1.005–1.030)
Urobilinogen, UA: 0.2 mg/dL (ref 0.0–1.0)
pH: 6 (ref 5.0–8.0)

## 2012-03-25 LAB — URINE MICROSCOPIC-ADD ON

## 2012-03-25 LAB — CLOSTRIDIUM DIFFICILE BY PCR: Toxigenic C. Difficile by PCR: NEGATIVE

## 2012-03-25 MED ORDER — HYDROMORPHONE HCL PF 1 MG/ML IJ SOLN
0.5000 mg | INTRAMUSCULAR | Status: DC | PRN
  Administered 2012-03-25: 0.5 mg via INTRAVENOUS
  Filled 2012-03-25: qty 1

## 2012-03-25 MED ORDER — SODIUM CHLORIDE 0.9 % IV BOLUS (SEPSIS)
1000.0000 mL | Freq: Once | INTRAVENOUS | Status: AC
  Administered 2012-03-25: 1000 mL via INTRAVENOUS

## 2012-03-25 MED ORDER — HYDROMORPHONE HCL PF 1 MG/ML IJ SOLN
1.0000 mg | INTRAMUSCULAR | Status: DC | PRN
  Administered 2012-03-25 – 2012-03-26 (×4): 1 mg via INTRAVENOUS
  Filled 2012-03-25 (×5): qty 1

## 2012-03-25 MED ORDER — SODIUM CHLORIDE 0.9 % IV SOLN
INTRAVENOUS | Status: DC
  Administered 2012-03-25 – 2012-03-28 (×4): via INTRAVENOUS

## 2012-03-25 MED ORDER — FENTANYL CITRATE 0.05 MG/ML IJ SOLN
50.0000 ug | Freq: Once | INTRAMUSCULAR | Status: AC
  Administered 2012-03-25: 50 ug via INTRAVENOUS
  Filled 2012-03-25: qty 2

## 2012-03-25 MED ORDER — MORPHINE SULFATE 2 MG/ML IJ SOLN
1.0000 mg | INTRAMUSCULAR | Status: DC | PRN
  Administered 2012-03-25: 1 mg via INTRAVENOUS
  Filled 2012-03-25: qty 1

## 2012-03-25 MED ORDER — HYDROMORPHONE HCL PF 1 MG/ML IJ SOLN
1.0000 mg | Freq: Once | INTRAMUSCULAR | Status: AC
  Administered 2012-03-25: 1 mg via INTRAVENOUS

## 2012-03-25 MED ORDER — HYDROMORPHONE HCL PF 1 MG/ML IJ SOLN
0.5000 mg | Freq: Once | INTRAMUSCULAR | Status: AC
  Administered 2012-03-25: 0.5 mg via INTRAVENOUS
  Filled 2012-03-25: qty 1

## 2012-03-25 MED ORDER — DEXTROSE 5 % IV SOLN
1.0000 g | Freq: Once | INTRAVENOUS | Status: AC
  Administered 2012-03-25: 1 g via INTRAVENOUS
  Filled 2012-03-25: qty 10

## 2012-03-25 MED ORDER — PROMETHAZINE HCL 25 MG/ML IJ SOLN
12.5000 mg | Freq: Four times a day (QID) | INTRAMUSCULAR | Status: DC | PRN
  Administered 2012-03-25 – 2012-03-26 (×3): 12.5 mg via INTRAMUSCULAR
  Filled 2012-03-25 (×3): qty 1

## 2012-03-25 MED ORDER — NYSTATIN-TRIAMCINOLONE 100000-0.1 UNIT/GM-% EX CREA
TOPICAL_CREAM | Freq: Two times a day (BID) | CUTANEOUS | Status: DC
  Administered 2012-03-25 – 2012-03-29 (×9): via TOPICAL
  Administered 2012-03-29: 1 via TOPICAL
  Administered 2012-03-30 (×2): via TOPICAL
  Filled 2012-03-25 (×2): qty 15
  Filled 2012-03-25: qty 30

## 2012-03-25 MED ORDER — ZOLPIDEM TARTRATE 5 MG PO TABS
10.0000 mg | ORAL_TABLET | Freq: Every evening | ORAL | Status: DC | PRN
  Administered 2012-03-25 – 2012-03-31 (×4): 10 mg via ORAL
  Filled 2012-03-25: qty 1
  Filled 2012-03-25 (×2): qty 2
  Filled 2012-03-25: qty 1
  Filled 2012-03-25: qty 2

## 2012-03-25 MED ORDER — HEPARIN SODIUM (PORCINE) 5000 UNIT/ML IJ SOLN
5000.0000 [IU] | Freq: Three times a day (TID) | INTRAMUSCULAR | Status: DC
  Administered 2012-03-25 – 2012-04-01 (×22): 5000 [IU] via SUBCUTANEOUS
  Filled 2012-03-25 (×24): qty 1

## 2012-03-25 MED ORDER — PROMETHAZINE HCL 25 MG/ML IJ SOLN
12.5000 mg | Freq: Once | INTRAMUSCULAR | Status: AC
  Administered 2012-03-25: 12.5 mg via INTRAMUSCULAR
  Filled 2012-03-25: qty 1

## 2012-03-25 MED ORDER — DEXTROSE 5 % IV SOLN
500.0000 mg | Freq: Once | INTRAVENOUS | Status: DC
  Administered 2012-03-25: 500 mg via INTRAVENOUS
  Filled 2012-03-25: qty 500

## 2012-03-25 MED ORDER — ONDANSETRON HCL 4 MG/2ML IJ SOLN
INTRAMUSCULAR | Status: AC
  Administered 2012-03-25: 03:00:00 via INTRAVENOUS
  Filled 2012-03-25: qty 2

## 2012-03-25 NOTE — H&P (Signed)
PCP:   [name], MD, MD  Dr. Deland Pretty  Chief Complaint:  Diarrhoea and Vomiting and cramps  65 yr aaf started to to have diarrhoea on Tuesday.  This became profuse and seemed to be every time she got up.Tuesday night she started to have some nausea and then the vomiting started and she continued to vomit.  No blood in stool or vomit.  Vomit was mainly food contents.  No sick contacts at present.  Last diarrhioea was 2:00 am this am and has finally started slowing   Diarrhoea is watery, and yellow-not foul or dark.  States that she had a recent Abcess incised by Dr. Ernestine Mcmurray and was placed on Augmentin.  Finished the course of Abx yesterday. Currently is on Remicaide for Crohn's and sees Dr. Karilyn Cota in [facility] for this. Has felt weak, but no dizziness while standing.  Has not been able to keep anything down.  Has no current Nausea.   No specfici abd pain and doesn't feel like the crohn's flares that she usually has.  Review of Systems:  The patient denies anorexia, fever, weight loss,, vision loss, decreased hearing, hoarseness, chest pain, syncope, dyspnea on exertion, peripheral edema, balance deficits, hemoptysis, abdominal pain, melena, hematochezia, severe indigestion/heartburn, hematuria, incontinence, genital sores, muscle weakness, suspicious skin lesions, transient blindness, difficulty walking, depression, unusual weight change, abnormal bleeding, enlarged lymph nodes, angioedema, and breast masses.  Past Medical History: Crohn's disease Partial Small Bowel obstruction 5/12 Chronic anemia Hiatal Hernia Altered LFT's Perirectal abcess I& D 4/13 Thrombocytosis Dysphagia   Past surgical history: Past Surgical History  Procedure Date  . Bowel resection   . Colostomy     [name]  . Anal stenosis   . Colonoscopy 2000  . Cholecystectomy   . Appendectomy   . Goiter 1999  . Esophagogastroduodenoscopy 11/11/2011    Procedure: ESOPHAGOGASTRODUODENOSCOPY (EGD);  Surgeon: Barrie Folk, MD;  Location[facility] ENDOSCOPY;  Service: Endoscopy;  Laterality: N/A;  . Flexible sigmoidoscopy 11/11/2011    Procedure: FLEXIBLE SIGMOIDOSCOPY;  Surgeon: Barrie Folk, MD;  Location: [facility] ENDOSCOPY;  Service: Endoscopy;  Laterality: N/A;    Medications: Prior to Admission medications   Medication Sig Start Date End Date Taking? Authorizing Provider  Calcium-Vitamin D 600-200 MG-UNIT per tablet Take 1 tablet by mouth daily. 12/18/11  Yes Alison Murray, MD  Cyanocobalamin (VITAMIN B-12 IJ) Inject as directed every 30 (thirty) days.     Yes Historical Provider, MD  dicyclomine (BENTYL) 10 MG capsule Take 1 capsule (10 mg total) by mouth 3 (three) times daily. 02/04/12 02/03/13 Yes Len Blalock, NP  HYDROcodone-acetaminophen (LORTAB) 7.5-500 MG per tablet Take 1 tablet by mouth every 8 (eight) hours as needed. For pain. 12/18/11  Yes Alison Murray, MD  inFLIXimab (REMICADE) 100 MG injection Inject 100 mg into the vein every 6 (six) weeks. Last infusion was in October , next infusion to be 01-07-12   Yes Historical Provider, MD  multivitamin [facility]) per tablet Take 1 tablet by mouth daily. 12/18/11  Yes Alison Murray, MD  nystatin-triamcinolone [facility] II) cream APPLY TOPICALLY 4 TIMES A DAY 02/09/12  Yes Len Blalock, NP  pantoprazole (PROTONIX) 40 MG tablet Take 1 tablet (40 mg total) by mouth daily. 12/18/11  Yes Alison Murray, MD  POTASSIUM PO Take 40 mEq by mouth 2 (two) times daily.   Yes Historical Provider, MD  Probiotic Product (PROBIOTIC FORMULA PO) Take 1 tablet by mouth daily.    Yes Historical  Provider, MD  promethazine (PHENERGAN) 25 MG tablet Take 1 tablet (25 mg total) by mouth every 6 (six) hours as needed. Nausea 12/18/11  Yes Alison Murray, MD  spironolactone (ALDACTONE) 50 MG tablet Take 1 tablet (50 mg total) by mouth daily. 12/18/11 12/17/12 Yes Alma Concepcion Elk, MD  zolpidem (AMBIEN) 10 MG tablet Take 1 tablet (10 mg total) by mouth as needed. insomnia 12/18/11  Yes Alison Murray, MD    Allergies:   Allergies  Allergen Reactions  . Aspirin Other (See Comments)    Clamps her stomach  . Sulfa Antibiotics Nausea And Vomiting    Social History:  reports that she quit smoking about 15 years ago. Her smoking use included Cigarettes. She quit after 4 years of use. She has never used smokeless tobacco. She reports that she does not drink alcohol or use illicit drugs.  Family History: Family History  Problem Relation Age of Onset  . Diabetes Mother   . Healthy Sister   . Hypertension Brother   . Colon cancer Brother     Physical Exam: Filed Vitals:   03/25/12 0615 03/25/12 0645 03/25/12 0700 03/25/12 0715  BP: 100/56 89/56 96/48  100/58  Pulse: 108 97 97 95  Temp:      TempSrc:      Resp: 20 17 17 18   SpO2: 97% 97% 97% 98%    HEENT-Alert AAF, NAD. CHEST-cta b, no added sound CARDS-s1 s2 no m/r/g, rrr, ?Mild JVD, no bruit ABD-lower abd scar.  NO rebound, no guard, no rebound SKIN-Mild L ankle Swelling NEURO-alert, oriented x3 speech: normal in context and clarity cranial nerves II-XII: intact motor strength: full proximally and distally sensation: intact to vibration, pain, and light touch cerebellar: finger-to-nose and heel-to-shin intact reflexes: full and symmetric   Labs on Admission:   Basename 03/25/12 0426 03/22/12 1151  NA 138 137  K 3.6 3.8  CL 112 112  CO2 -- 18*  GLUCOSE 90 84  BUN 16 8  CREATININE 1.30* 0.99  CALCIUM -- 8.8  MG -- --  PHOS -- --   No results found for this basename: AST:2,ALT:2,ALKPHOS:2,BILITOT:2,PROT:2,ALBUMIN:2 in the last 72 hours No results found for this basename: LIPASE:2,AMYLASE:2 in the last 72 hours  Basename 03/25/12 0426 03/25/12 0415  WBC -- 15.2*  NEUTROABS -- 11.3*  HGB 11.6* 10.7*  HCT 34.0* 32.2*  MCV -- 85.2  PLT -- 446*   No results found for this basename: CKTOTAL:3,CKMB:3,CKMBINDEX:3,TROPONINI:3 in the last 72 hours No results found for this basename:  TSH,T4TOTAL,FREET3,T3FREE,THYROIDAB in the last 72 hours No results found for this basename: VITAMINB12:2,FOLATE:2,FERRITIN:2,TIBC:2,IRON:2,RETICCTPCT:2 in the last 72 hours  Radiological Exams on Admission: Dg Abd Acute W/chest  03/25/2012  *RADIOLOGY REPORT*  Clinical Data: Vomiting, diarrhea and body aches.  ACUTE ABDOMEN SERIES (ABDOMEN 2 VIEW & CHEST 1 VIEW)  Comparison: Chest and abdominal radiographs performed 12/15/2011  Findings: The lungs are well-aerated.  Minimal bilateral airspace opacities could reflect mild pneumonia.  There is no evidence of pleural effusion or pneumothorax.  The cardiomediastinal silhouette is within normal limits.  A relatively large hiatal hernia is noted, with an air-fluid level. A nonspecific bowel gas pattern is noted, with segments of small bowel distension, measuring up to 4.5 cm in diameter, and associated air-fluid levels.  However, air is also seen within the colon.  The pattern suggests against bowel obstruction.  No free intra-abdominal air is identified on the provided upright view.  No acute osseous abnormalities are seen; the sacroiliac  joints are unremarkable in appearance.  Scattered postoperative change is noted along the right side of the abdomen.  IMPRESSION:  1.  Nonspecific bowel gas pattern, with segments of small bowel distension, measuring up to 4.5 cm in diameter, and associated air- fluid levels.  However, air also seen within the colon.  The pattern suggests against bowel obstruction.  No free intra- abdominal air seen. 2.  Relatively large hiatal hernia noted. 3.  Minimal bilateral airspace opacities could reflect mild pneumonia.  Original Report Authenticated By: Tonia Ghent, M.D.    Assessment/Plan 1-nausea vomiting and diarrhea-unclear if this is related to her Crohn's disease. She theoretically it does not sound like she has C. difficile but does need to rule out tumor she just had a recent abscess that was incision and then was placed on  Augmentin. Therefore we need to rule out C. difficile. I will also get a lipase as we need to ensure there is no pancreatic issue although this is more likely than not.  We will place her on Phenergan Q6 when necessary 12.5 mg and keep a soft diet/clear liquid diet. Get stool cultures as well. She was given a dose of ceftriaxone and azithromycin for unclear reasons and this will be discontinued.  A bone x-ray done shows intracolonic air. In relatively large hiatal hernia with some minimal bilateral airspace opacities. As she is nonfebrile at present would hold off on antibiotics as per above 2-Crohn's disease-patient is on Remicade and takes every 6 weeks. Patient follows up with her gastroenterologist at [facility] and is in the process of transferring over her.   3-chronic anemia-she may be hemoconcentrated given her nausea and vomiting we will try the fluid at 75 cc an hour normal saline 4-acute kidney injury-likely secondary to significant volume depletion her baseline creatinines less than 1 and today is about 1.3.  Continue saline. 5-hypotension-patient was given saline bolus 1000 cc issue is slightly hypotensive in the emergency room. We'll continue IV fluids and hold her antihypertensive medications, spironolactone 50, for now. 6-GERD-patient's post implant was on this be held pending C. difficile results.  Obs on Tm-9 Full code  [facility] 03/25/2012, 7:20 AM

## 2012-03-25 NOTE — ED Notes (Signed)
Patient wanting to speak with MD.  Patient continuing with pain and nausea.  Patient requesting certain medications for pain and nausea.  MD notified.  No new orders per MD.

## 2012-03-25 NOTE — Progress Notes (Signed)
Utilization Review Completed.Danielle [name] T4/19/2013   

## 2012-03-25 NOTE — ED Notes (Signed)
Attempted to call report but receiving RN is unavailable. 

## 2012-03-25 NOTE — Progress Notes (Signed)
Pt came from home. Skin is dry and intact with no skin issues. Code status is full code. Activity level is stand by assist. Enteric contact precaution initiated to rule out C. Diff. Pt vitals are stable and has been oriented to the unit.

## 2012-03-25 NOTE — ED Provider Notes (Signed)
History     CSN: [id number]  Arrival date & time 03/25/12  4098   First MD Initiated Contact with Patient 03/25/12 0357      Chief Complaint  Patient presents with  . Chest Pain  . Abdominal Pain    (Consider location/radiation/quality/duration/timing/severity/associated sxs/prior treatment) Patient is a 66 y.o. female presenting with chest pain, abdominal pain, and diarrhea. The history is provided by the patient.  Chest Pain The chest pain began 6 - 12 hours ago. Chest pain occurs intermittently. The chest pain is unchanged. Associated with: nothing. At its most intense, the pain is at 10/10. The pain is currently at 0/10. The severity of the pain is severe. The quality of the pain is described as sharp. The pain does not radiate. Exacerbated by: nothing. Primary symptoms include abdominal pain, nausea and vomiting.  Pertinent negatives for associated symptoms include no diaphoresis and no near-syncope. She tried nothing for the symptoms. Risk factors include being elderly.  Pertinent negatives for past medical history include no MI.  Pertinent negatives for family medical history include: no Marfan's syndrome in family.  Procedure history is negative for cardiac catheterization.    Abdominal Pain The primary symptoms of the illness include abdominal pain, nausea, vomiting and diarrhea. The current episode started more than 2 days ago. The onset of the illness was gradual. The problem has been gradually worsening.  The patient has had a change in bowel habit. Risk factors for an acute abdominal problem include being elderly. Symptoms associated with the illness do not include anorexia, diaphoresis or constipation. Significant associated medical issues include inflammatory bowel disease.  Diarrhea The primary symptoms include abdominal pain, nausea, vomiting and diarrhea. The illness began 3 to 5 days ago. The onset was gradual. The problem has been gradually worsening.  The vomiting  began yesterday. Vomiting occurs 2 to 5 times per day. The emesis contains stomach contents. Risk factors: none.  The illness does not include anorexia or constipation. Significant associated medical issues include inflammatory bowel disease. Risk factors: none.  Thinks she is having a crohns flair  Past Medical History  Diagnosis Date  . Crohn's disease   . Chronic diarrhea   . Joint pain   . GERD (gastroesophageal reflux disease)   . Polyarthralgia   . Shortness of breath   . Pneumonia   . Anemia   . Hernia (acquired) (recurrent)     hiatal  . Hiatal hernia   . Back pain     Past Surgical History  Procedure Date  . Bowel resection   . Colostomy     [name]  . Anal stenosis   . Colonoscopy 2000  . Cholecystectomy   . Appendectomy   . Goiter 1999  . Esophagogastroduodenoscopy 11/11/2011    Procedure: ESOPHAGOGASTRODUODENOSCOPY (EGD);  Surgeon: Barrie Folk, MD;  Location: [facility] ENDOSCOPY;  Service: Endoscopy;  Laterality: N/A;  . Flexible sigmoidoscopy 11/11/2011    Procedure: FLEXIBLE SIGMOIDOSCOPY;  Surgeon: Barrie Folk, MD;  Location: [facility] ENDOSCOPY;  Service: Endoscopy;  Laterality: N/A;    Family History  Problem Relation Age of Onset  . Diabetes Mother   . Healthy Sister   . Hypertension Brother   . Colon cancer Brother     History  Substance Use Topics  . Smoking status: Former Smoker -- 4 years    Types: Cigarettes    Quit date: 10/05/1996  . Smokeless tobacco: Never Used   Comment: 1 pack every two weeks when she smoked  . Alcohol  Use: No    OB History    Grav Para Term Preterm Abortions TAB SAB Ect Mult Living                  Review of Systems  Constitutional: Negative for diaphoresis.  Cardiovascular: Positive for chest pain. Negative for near-syncope.  Gastrointestinal: Positive for nausea, vomiting, abdominal pain and diarrhea. Negative for constipation and anorexia.  All other systems reviewed and are negative.    Allergies  Aspirin and  Sulfa antibiotics  Home Medications   Current Outpatient Rx  Name Route Sig Dispense Refill  . CALCIUM-VITAMIN D 600-200 MG-UNIT PO TABS Oral Take 1 tablet by mouth daily. 30 tablet 3  . VITAMIN B-12 IJ Injection Inject as directed every 30 (thirty) days.      [name] DICYCLOMINE HCL 10 MG PO CAPS Oral Take 1 capsule (10 mg total) by mouth 3 (three) times daily. 90 capsule 3  . HYDROCODONE-ACETAMINOPHEN 7.5-500 MG PO TABS Oral Take 1 tablet by mouth every 8 (eight) hours as needed. For pain. 45 tablet 0  . INFLIXIMAB 100 MG IV SOLR Intravenous Inject 100 mg into the vein every 6 (six) weeks. Last infusion was in October , next infusion to be 01-07-12    . MULTIVITAMINS PO TABS Oral Take 1 tablet by mouth daily. 30 tablet 3  . NYSTATIN-TRIAMCINOLONE 100000-0.1 UNIT/GM-% EX CREA  APPLY TOPICALLY 4 TIMES A DAY 30 g 0  . PANTOPRAZOLE SODIUM 40 MG PO TBEC Oral Take 1 tablet (40 mg total) by mouth daily. 30 tablet 5  . POTASSIUM PO Oral Take 40 mEq by mouth 2 (two) times daily.    [name] PROBIOTIC FORMULA PO Oral Take 1 tablet by mouth daily.     [name] PROMETHAZINE HCL 25 MG PO TABS Oral Take 1 tablet (25 mg total) by mouth every 6 (six) hours as needed. Nausea 30 tablet 0  . SPIRONOLACTONE 50 MG PO TABS Oral Take 1 tablet (50 mg total) by mouth daily. 30 tablet 3  . ZOLPIDEM TARTRATE 10 MG PO TABS Oral Take 1 tablet (10 mg total) by mouth as needed. insomnia 30 tablet 0    BP 96/60  Pulse 100  Temp(Src) 98.8 F (37.1 C) (Oral)  Resp 17  SpO2 100%  Physical Exam  Constitutional: She appears well-developed and well-nourished. No distress.  HENT:  Head: Normocephalic and atraumatic.  Mouth/Throat: No oropharyngeal exudate.  Eyes: Conjunctivae are normal. Pupils are equal, round, and reactive to light.  Neck: Normal range of motion. Neck supple.  Cardiovascular: Normal rate and regular rhythm.   Pulmonary/Chest: She has decreased breath sounds.  Abdominal: Soft. Bowel sounds are normal. There is  generalized tenderness.  Musculoskeletal: Normal range of motion. She exhibits no edema.  Neurological: She is alert.  Skin: Skin is warm and dry.  Psychiatric: She has a normal mood and affect.    ED Course  Procedures (including critical care time)  Labs Reviewed  CBC - Abnormal; Notable for the following:    WBC 15.2 (*)    RBC 3.78 (*)    Hemoglobin 10.7 (*)    HCT 32.2 (*)    Platelets 446 (*)    All other components within normal limits  DIFFERENTIAL - Abnormal; Notable for the following:    Neutro Abs 11.3 (*)    Monocytes Absolute 1.2 (*)    All other components within normal limits  POCT I-STAT, CHEM 8 - Abnormal; Notable for the following:    Creatinine,  Ser 1.30 (*)    Hemoglobin 11.6 (*)    HCT 34.0 (*)    All other components within normal limits  POCT I-STAT TROPONIN I  URINALYSIS, ROUTINE W REFLEX MICROSCOPIC  URINE CULTURE  STOOL CULTURE  CLOSTRIDIUM DIFFICILE BY PCR   Dg Abd Acute W/chest  03/25/2012  *RADIOLOGY REPORT*  Clinical Data: Vomiting, diarrhea and body aches.  ACUTE ABDOMEN SERIES (ABDOMEN 2 VIEW & CHEST 1 VIEW)  Comparison: Chest and abdominal radiographs performed 12/15/2011  Findings: The lungs are well-aerated.  Minimal bilateral airspace opacities could reflect mild pneumonia.  There is no evidence of pleural effusion or pneumothorax.  The cardiomediastinal silhouette is within normal limits.  A relatively large hiatal hernia is noted, with an air-fluid level. A nonspecific bowel gas pattern is noted, with segments of small bowel distension, measuring up to 4.5 cm in diameter, and associated air-fluid levels.  However, air is also seen within the colon.  The pattern suggests against bowel obstruction.  No free intra-abdominal air is identified on the provided upright view.  No acute osseous abnormalities are seen; the sacroiliac joints are unremarkable in appearance.  Scattered postoperative change is noted along the right side of the abdomen.   IMPRESSION:  1.  Nonspecific bowel gas pattern, with segments of small bowel distension, measuring up to 4.5 cm in diameter, and associated air- fluid levels.  However, air also seen within the colon.  The pattern suggests against bowel obstruction.  No free intra- abdominal air seen. 2.  Relatively large hiatal hernia noted. 3.  Minimal bilateral airspace opacities could reflect mild pneumonia.  Original Report Authenticated By: Tonia Ghent, M.D.     No diagnosis found.    MDM  Case d/w [facility] admit        Yelitza Reach K Josten Warmuth-Rasch, MD 03/25/12 [phone]

## 2012-03-25 NOTE — ED Notes (Addendum)
Patient continues to insist upon having nausea medication.  MD notified.  No new orders at this time.  It was explained to patient of her BP being too low for both pain medications and antinausea medications.  Zofran offered and declined.

## 2012-03-25 NOTE — ED Notes (Signed)
Patient with right chest pain, intermittent in nature.  Patient also has nausea, vomiting and diarrhea that started yesterday with the chest pain.  Patient does have some shortness of breath with exertion.  Patient was given 4mg  of Zofran enroute for the nausea.

## 2012-03-26 LAB — CBC
Hemoglobin: 11.4 g/dL — ABNORMAL LOW (ref 12.0–15.0)
MCH: 27.5 pg (ref 26.0–34.0)
MCHC: 31.8 g/dL (ref 30.0–36.0)

## 2012-03-26 LAB — BASIC METABOLIC PANEL
BUN: 12 mg/dL (ref 6–23)
Calcium: 9.2 mg/dL (ref 8.4–10.5)
Creatinine, Ser: 1.21 mg/dL — ABNORMAL HIGH (ref 0.50–1.10)
GFR calc non Af Amer: 46 mL/min — ABNORMAL LOW (ref >90)
Glucose, Bld: 67 mg/dL — ABNORMAL LOW (ref 70–99)
Potassium: 3.7 mEq/L (ref 3.5–5.1)

## 2012-03-26 LAB — URINE CULTURE

## 2012-03-26 LAB — PROTIME-INR: Prothrombin Time: 14.9 seconds (ref 11.6–15.2)

## 2012-03-26 MED ORDER — PROMETHAZINE HCL 25 MG/ML IJ SOLN
12.5000 mg | INTRAMUSCULAR | Status: DC | PRN
  Administered 2012-03-26 – 2012-03-28 (×9): 12.5 mg via INTRAMUSCULAR
  Filled 2012-03-26 (×9): qty 1

## 2012-03-26 MED ORDER — METRONIDAZOLE IN NACL 5-0.79 MG/ML-% IV SOLN
500.0000 mg | Freq: Three times a day (TID) | INTRAVENOUS | Status: DC
  Administered 2012-03-26 – 2012-04-01 (×18): 500 mg via INTRAVENOUS
  Filled 2012-03-26 (×21): qty 100

## 2012-03-26 MED ORDER — HYDROMORPHONE HCL PF 1 MG/ML IJ SOLN
1.0000 mg | INTRAMUSCULAR | Status: DC | PRN
  Administered 2012-03-26: 1 mg via INTRAVENOUS
  Administered 2012-03-26: 1.5 mg via INTRAVENOUS
  Administered 2012-03-26: 2 mg via INTRAVENOUS
  Administered 2012-03-27 (×2): 1 mg via INTRAVENOUS
  Filled 2012-03-26 (×4): qty 1
  Filled 2012-03-26: qty 2
  Filled 2012-03-26: qty 1

## 2012-03-26 NOTE — Consult Note (Signed)
[facility] Consult Note  Referring Provider: No ref. provider found Primary Care Physician:  [name], MD, MD Primary Gastroenterologist:  Dr.  Antony Contras Complaint: Nausea vomiting and diarrhea HPI: Danielle [name] is an 66 y.o. black female  with a history of Crohn's disease admitted with acute onset of nausea vomiting and diarrhea beginning 2 days prior to admission with profuse diarrhea and significant nausea and vomiting at the onset with some vague epigastric pain which was different location than the pain she is used to from her Crohn's disease. She had a rectal abscess drained and was started on Augmentin for a week ending 5 days prior to the onset of her symptoms. She had a brief admission for nausea vomiting and diarrhea in January was treated conservatively. She was also in the hospital in December and I did an EGD which showed a large hiatal hernia and a flexible sigmoidoscopy to 60 cm which was essentially normal except for some perianal erythema. She denies any recent travel or any sick household contacts. She sees Dr. Egbert Garibaldi on in refill for general gastrointestinal care and takes Remicade every 6 weeks. Her last abdominal CT suggested some descending colon thickening which was in somewhat of contrast to a sigmoidoscopy done 1 month earlier. Her C. difficile toxin was negative on this admission.  Past Medical History  Diagnosis Date  . Crohn's disease   . Chronic diarrhea   . Joint pain   . GERD (gastroesophageal reflux disease)   . Polyarthralgia   . Shortness of breath   . Pneumonia   . Anemia   . Hernia (acquired) (recurrent)     hiatal  . Hiatal hernia   . Back pain     Past Surgical History  Procedure Date  . Bowel resection   . Colostomy     [name]  . Anal stenosis   . Colonoscopy 2000    needs to have one  . Cholecystectomy   . Appendectomy   . Goiter 1999  . Esophagogastroduodenoscopy 11/11/2011    Procedure: ESOPHAGOGASTRODUODENOSCOPY (EGD);   Surgeon: Barrie Folk, MD;  Location: [facility] ENDOSCOPY;  Service: Endoscopy;  Laterality: N/A;  . Flexible sigmoidoscopy 11/11/2011    Procedure: FLEXIBLE SIGMOIDOSCOPY;  Surgeon: Barrie Folk, MD;  Location: [facility] ENDOSCOPY;  Service: Endoscopy;  Laterality: N/A;    Medications Prior to Admission  Medication Dose Route Frequency Provider Last Rate Last Dose  . 0.9 %  sodium chloride infusion   Intravenous Continuous Rhetta Mura, MD 75 mL/hr at 03/26/12 0353    . cefTRIAXone (ROCEPHIN) 1 g in dextrose 5 % 50 mL IVPB  1 g Intravenous Once April K Palumbo-Rasch, MD   1 g at 03/25/12 1610  . fentaNYL (SUBLIMAZE) injection 50 mcg  50 mcg Intravenous Once April K Palumbo-Rasch, MD   50 mcg at 03/25/12 0413  . heparin injection 5,000 Units  5,000 Units Subcutaneous Q8H Rhetta Mura, MD   5,000 Units at 03/26/12 0525  . HYDROmorphone (DILAUDID) injection 0.5 mg  0.5 mg Intravenous Once April K Palumbo-Rasch, MD   0.5 mg at 03/25/12 0522  . HYDROmorphone (DILAUDID) injection 0.5 mg  0.5 mg Intravenous Once April K Palumbo-Rasch, MD   0.5 mg at 03/25/12 9604  . HYDROmorphone (DILAUDID) injection 1 mg  1 mg Intravenous Once Rhetta Mura, MD   1 mg at 03/25/12 1535  . HYDROmorphone (DILAUDID) injection 1-2 mg  1-2 mg Intravenous Q3H PRN Rhetta Mura, MD      . metroNIDAZOLE (FLAGYL)  IVPB 500 mg  500 mg Intravenous Q8H Barrie Folk, MD      . nystatin-triamcinolone (MYCOLOG II) cream   Topical BID Rhetta Mura, MD      . ondansetron [facility]) 4 MG/2ML injection           . promethazine (PHENERGAN) injection 12.5 mg  12.5 mg Intramuscular Once April K Palumbo-Rasch, MD   12.5 mg at 03/25/12 0630  . promethazine (PHENERGAN) injection 12.5 mg  12.5 mg Intramuscular Q4H PRN Jinger Neighbors, NP   12.5 mg at 03/26/12 0554  . sodium chloride 0.9 % bolus 1,000 mL  1,000 mL Intravenous Once April K Palumbo-Rasch, MD   1,000 mL at 03/25/12 0353  . zolpidem (AMBIEN) tablet 10 mg  10 mg Oral QHS  PRN Rhetta Mura, MD   10 mg at 03/25/12 2121  . DISCONTD: azithromycin (ZITHROMAX) 500 mg in dextrose 5 % 250 mL IVPB  500 mg Intravenous Once April K Palumbo-Rasch, MD   500 mg at 03/25/12 0800  . DISCONTD: HYDROmorphone (DILAUDID) injection 0.5 mg  0.5 mg Intravenous Q3H PRN Rhetta Mura, MD   0.5 mg at 03/25/12 1338  . DISCONTD: HYDROmorphone (DILAUDID) injection 1 mg  1 mg Intravenous Q3H PRN Rhetta Mura, MD   1 mg at 03/26/12 0834  . DISCONTD: morphine 2 MG/ML injection 1 mg  1 mg Intravenous Q4H PRN Rhetta Mura, MD   1 mg at 03/25/12 1023  . DISCONTD: promethazine (PHENERGAN) injection 12.5 mg  12.5 mg Intramuscular Q6H PRN Rhetta Mura, MD   12.5 mg at 03/26/12 0106   Medications Prior to Admission  Medication Sig Dispense Refill  . Calcium-Vitamin D 600-200 MG-UNIT per tablet Take 1 tablet by mouth daily.  30 tablet  3  . Cyanocobalamin (VITAMIN B-12 IJ) Inject as directed every 30 (thirty) days.        [name] dicyclomine (BENTYL) 10 MG capsule Take 1 capsule (10 mg total) by mouth 3 (three) times daily.  90 capsule  3  . HYDROcodone-acetaminophen (LORTAB) 7.5-500 MG per tablet Take 1 tablet by mouth every 8 (eight) hours as needed. For pain.  45 tablet  0  . inFLIXimab (REMICADE) 100 MG injection Inject 100 mg into the vein every 6 (six) weeks. Last infusion was in October , next infusion to be 01-07-12      . multivitamin [facility]) per tablet Take 1 tablet by mouth daily.  30 tablet  3  . nystatin-triamcinolone (MYCOLOG II) cream APPLY TOPICALLY 4 TIMES A DAY  30 g  0  . pantoprazole (PROTONIX) 40 MG tablet Take 1 tablet (40 mg total) by mouth daily.  30 tablet  5  . POTASSIUM PO Take 40 mEq by mouth 2 (two) times daily.      . Probiotic Product (PROBIOTIC FORMULA PO) Take 1 tablet by mouth daily.       . promethazine (PHENERGAN) 25 MG tablet Take 1 tablet (25 mg total) by mouth every 6 (six) hours as needed. Nausea  30 tablet  0  . spironolactone  (ALDACTONE) 50 MG tablet Take 1 tablet (50 mg total) by mouth daily.  30 tablet  3  . zolpidem (AMBIEN) 10 MG tablet Take 1 tablet (10 mg total) by mouth as needed. insomnia  30 tablet  0    Allergies:  Allergies  Allergen Reactions  . Aspirin Other (See Comments)    Clamps her stomach  . Sulfa Antibiotics Nausea And Vomiting    Family History  Problem  Relation Age of Onset  . Diabetes Mother   . Healthy Sister   . Hypertension Brother   . Colon cancer Brother     died with colon cancer    Social History:  reports that she quit smoking about 15 years ago. Her smoking use included Cigarettes. She quit after 4 years of use. She has never used smokeless tobacco. She reports that she does not drink alcohol or use illicit drugs.  Review of Systems: negative except as above   Blood pressure 118/72, pulse 115, temperature 98.3 F (36.8 C), temperature source Oral, resp. rate 17, height 5\' 4"  (1.626 m), weight 68.947 kg (152 lb), SpO2 100.00%. Head: Normocephalic, without obvious abnormality, atraumatic Neck: no adenopathy, no carotid bruit, no JVD, supple, symmetrical, trachea midline and thyroid not enlarged, symmetric, no tenderness/mass/nodules Resp: clear to auscultation bilaterally Cardio: regular rate and rhythm, S1, S2 normal, no murmur, click, rub or gallop GI: Abdomen soft mildly tender in the epigastrium no hepatosplenomegaly mass or guarding Extremities: extremities normal, atraumatic, no cyanosis or edema  Results for orders placed during the hospital encounter of 03/25/12 (from the past 48 hour(s))  CBC     Status: Abnormal   Collection Time   03/25/12  4:15 AM      Component Value Range Comment   WBC 15.2 (*) 4.0 - 10.5 (K/uL)    RBC 3.78 (*) 3.87 - 5.11 (MIL/uL)    Hemoglobin 10.7 (*) 12.0 - 15.0 (g/dL)    HCT 16.1 (*) 09.6 - 46.0 (%)    MCV 85.2  78.0 - 100.0 (fL)    MCH 28.3  26.0 - 34.0 (pg)    MCHC 33.2  30.0 - 36.0 (g/dL)    RDW 04.5  40.9 - 81.1 (%)     Platelets 446 (*) 150 - 400 (K/uL)   DIFFERENTIAL     Status: Abnormal   Collection Time   03/25/12  4:15 AM      Component Value Range Comment   Neutrophils Relative 74  43 - 77 (%)    Neutro Abs 11.3 (*) 1.7 - 7.7 (K/uL)    Lymphocytes Relative 16  12 - 46 (%)    Lymphs Abs 2.5  0.7 - 4.0 (K/uL)    Monocytes Relative 8  3 - 12 (%)    Monocytes Absolute 1.2 (*) 0.1 - 1.0 (K/uL)    Eosinophils Relative 1  0 - 5 (%)    Eosinophils Absolute 0.2  0.0 - 0.7 (K/uL)    Basophils Relative 0  0 - 1 (%)    Basophils Absolute 0.0  0.0 - 0.1 (K/uL)   POCT I-STAT TROPONIN I     Status: Normal   Collection Time   03/25/12  4:25 AM      Component Value Range Comment   Troponin i, poc 0.01  0.00 - 0.08 (ng/mL)    Comment 3            POCT I-STAT, CHEM 8     Status: Abnormal   Collection Time   03/25/12  4:26 AM      Component Value Range Comment   Sodium 138  135 - 145 (mEq/L)    Potassium 3.6  3.5 - 5.1 (mEq/L)    Chloride 112  96 - 112 (mEq/L)    BUN 16  6 - 23 (mg/dL)    Creatinine, Ser 9.14 (*) 0.50 - 1.10 (mg/dL)    Glucose, Bld 90  70 - 99 (mg/dL)  Calcium, Ion 1.21  1.12 - 1.32 (mmol/L)    TCO2 15  0 - 100 (mmol/L)    Hemoglobin 11.6 (*) 12.0 - 15.0 (g/dL)    HCT 16.1 (*) 09.6 - 46.0 (%)   URINALYSIS, ROUTINE W REFLEX MICROSCOPIC     Status: Abnormal   Collection Time   03/25/12  6:06 AM      Component Value Range Comment   Color, Urine YELLOW  YELLOW     APPearance CLOUDY (*) CLEAR     Specific Gravity, Urine 1.015  1.005 - 1.030     pH 6.0  5.0 - 8.0     Glucose, UA NEGATIVE  NEGATIVE (mg/dL)    Hgb urine dipstick MODERATE (*) NEGATIVE     Bilirubin Urine NEGATIVE  NEGATIVE     Ketones, ur NEGATIVE  NEGATIVE (mg/dL)    Protein, ur NEGATIVE  NEGATIVE (mg/dL)    Urobilinogen, UA 0.2  0.0 - 1.0 (mg/dL)    Nitrite NEGATIVE  NEGATIVE     Leukocytes, UA LARGE (*) NEGATIVE    URINE CULTURE     Status: Normal   Collection Time   03/25/12  6:06 AM      Component Value Range  Comment   Specimen Description URINE, CLEAN CATCH      Special Requests NONE      Culture  Setup Time [id number]      Colony Count >=100,000 COLONIES/ML      Culture        Value: Multiple bacterial morphotypes present, none predominant. Suggest appropriate recollection if clinically indicated.   Report Status 03/26/2012 FINAL     URINE MICROSCOPIC-ADD ON     Status: Abnormal   Collection Time   03/25/12  6:06 AM      Component Value Range Comment   Squamous Epithelial / LPF RARE  RARE     WBC, UA TOO NUMEROUS TO COUNT  <3 (WBC/hpf)    RBC / HPF 0-2  <3 (RBC/hpf)    Bacteria, UA FEW (*) RARE    CLOSTRIDIUM DIFFICILE BY PCR     Status: Normal   Collection Time   03/25/12 11:00 AM      Component Value Range Comment   C difficile by pcr NEGATIVE  NEGATIVE    CBC     Status: Abnormal   Collection Time   03/25/12 11:10 AM      Component Value Range Comment   WBC 16.5 (*) 4.0 - 10.5 (K/uL)    RBC 3.84 (*) 3.87 - 5.11 (MIL/uL)    Hemoglobin 10.8 (*) 12.0 - 15.0 (g/dL)    HCT 78.2 (*) 95.6 - 46.0 (%)    MCV 85.9  78.0 - 100.0 (fL)    MCH 28.1  26.0 - 34.0 (pg)    MCHC 32.7  30.0 - 36.0 (g/dL)    RDW 21.3  08.6 - 57.8 (%)    Platelets 440 (*) 150 - 400 (K/uL)   CREATININE, SERUM     Status: Abnormal   Collection Time   03/25/12 11:10 AM      Component Value Range Comment   Creatinine, Ser 1.17 (*) 0.50 - 1.10 (mg/dL)    GFR calc non Af Amer 48 (*) >90 (mL/min)    GFR calc Af Amer 56 (*) >90 (mL/min)   LIPASE, BLOOD     Status: Normal   Collection Time   03/25/12 11:10 AM      Component Value Range Comment   Lipase 33  11 - 59 (U/L)   GLUCOSE, CAPILLARY     Status: Normal   Collection Time   03/25/12 12:36 PM      Component Value Range Comment   Glucose-Capillary 71  70 - 99 (mg/dL)   BASIC METABOLIC PANEL     Status: Abnormal   Collection Time   03/26/12  5:45 AM      Component Value Range Comment   Sodium 131 (*) 135 - 145 (mEq/L) DELTA CHECK NOTED   Potassium 3.7  3.5 -  5.1 (mEq/L)    Chloride 105  96 - 112 (mEq/L)    CO2 11 (*) 19 - 32 (mEq/L)    Glucose, Bld 67 (*) 70 - 99 (mg/dL)    BUN 12  6 - 23 (mg/dL)    Creatinine, Ser 4.09 (*) 0.50 - 1.10 (mg/dL)    Calcium 9.2  8.4 - 10.5 (mg/dL)    GFR calc non Af Amer 46 (*) >90 (mL/min)    GFR calc Af Amer 54 (*) >90 (mL/min)   CBC     Status: Abnormal   Collection Time   03/26/12  5:45 AM      Component Value Range Comment   WBC 19.6 (*) 4.0 - 10.5 (K/uL)    RBC 4.14  3.87 - 5.11 (MIL/uL)    Hemoglobin 11.4 (*) 12.0 - 15.0 (g/dL)    HCT 81.1 (*) 91.4 - 46.0 (%)    MCV 86.5  78.0 - 100.0 (fL)    MCH 27.5  26.0 - 34.0 (pg)    MCHC 31.8  30.0 - 36.0 (g/dL)    RDW 78.2  95.6 - 21.3 (%)    Platelets 491 (*) 150 - 400 (K/uL)   PROTIME-INR     Status: Normal   Collection Time   03/26/12  5:45 AM      Component Value Range Comment   Prothrombin Time 14.9  11.6 - 15.2 (seconds)    INR 1.15  0.00 - 1.49     Dg Abd Acute W/chest  03/25/2012  *RADIOLOGY REPORT*  Clinical Data: Vomiting, diarrhea and body aches.  ACUTE ABDOMEN SERIES (ABDOMEN 2 VIEW & CHEST 1 VIEW)  Comparison: Chest and abdominal radiographs performed 12/15/2011  Findings: The lungs are well-aerated.  Minimal bilateral airspace opacities could reflect mild pneumonia.  There is no evidence of pleural effusion or pneumothorax.  The cardiomediastinal silhouette is within normal limits.  A relatively large hiatal hernia is noted, with an air-fluid level. A nonspecific bowel gas pattern is noted, with segments of small bowel distension, measuring up to 4.5 cm in diameter, and associated air-fluid levels.  However, air is also seen within the colon.  The pattern suggests against bowel obstruction.  No free intra-abdominal air is identified on the provided upright view.  No acute osseous abnormalities are seen; the sacroiliac joints are unremarkable in appearance.  Scattered postoperative change is noted along the right side of the abdomen.  IMPRESSION:  1.   Nonspecific bowel gas pattern, with segments of small bowel distension, measuring up to 4.5 cm in diameter, and associated air- fluid levels.  However, air also seen within the colon.  The pattern suggests against bowel obstruction.  No free intra- abdominal air seen. 2.  Relatively large hiatal hernia noted. 3.  Minimal bilateral airspace opacities could reflect mild pneumonia.  Original Report Authenticated By: Tonia Ghent, M.D.    Assessment: Nausea vomiting and diarrhea with plain film suggesting probable ileus. Not clear whether related to  an active Crohn's flare in with a history of recent antibiotics and a negative C. difficile toxin. Plan:  For now would simply start her on Flagyl empirically and see if her slight improvement continues. If she does not do well and or has persistent leukocytosis or fever will probably re\re order an abdominal CT scan or at least repeat plain films. Will follow with you. We'll keep on clear liquid diet for now. Raimundo Corbit C 03/26/2012, 11:47 AM

## 2012-03-26 NOTE — Plan of Care (Signed)
Problem: Phase I Progression Outcomes Goal: OOB as tolerated unless otherwise ordered Outcome: Completed/Met Date Met:  03/26/12 Ad lib Goal: Initial discharge plan identified Outcome: Completed/Met Date Met:  03/26/12 Return home

## 2012-03-27 LAB — DIFFERENTIAL
Basophils Absolute: 0 10*3/uL (ref 0.0–0.1)
Eosinophils Absolute: 0.1 10*3/uL (ref 0.0–0.7)
Lymphocytes Relative: 16 % (ref 12–46)
Lymphs Abs: 2 10*3/uL (ref 0.7–4.0)
Monocytes Absolute: 1.3 10*3/uL — ABNORMAL HIGH (ref 0.1–1.0)
Neutro Abs: 8.8 10*3/uL — ABNORMAL HIGH (ref 1.7–7.7)

## 2012-03-27 LAB — BASIC METABOLIC PANEL
BUN: 13 mg/dL (ref 6–23)
Chloride: 112 mEq/L (ref 96–112)
Creatinine, Ser: 1.19 mg/dL — ABNORMAL HIGH (ref 0.50–1.10)
GFR calc Af Amer: 55 mL/min — ABNORMAL LOW (ref >90)

## 2012-03-27 LAB — CBC
HCT: 32.3 % — ABNORMAL LOW (ref 36.0–46.0)
MCHC: 31.6 g/dL (ref 30.0–36.0)
MCV: 87.3 fL (ref 78.0–100.0)
RDW: 14.2 % (ref 11.5–15.5)

## 2012-03-27 MED ORDER — DICYCLOMINE HCL 10 MG PO CAPS
10.0000 mg | ORAL_CAPSULE | Freq: Three times a day (TID) | ORAL | Status: DC
  Filled 2012-03-27 (×2): qty 1

## 2012-03-27 MED ORDER — HYDROMORPHONE HCL PF 1 MG/ML IJ SOLN
1.0000 mg | INTRAMUSCULAR | Status: DC | PRN
  Administered 2012-03-27 (×2): 1 mg via INTRAVENOUS
  Administered 2012-03-27: 1.5 mg via INTRAVENOUS
  Administered 2012-03-28 – 2012-04-01 (×16): 1 mg via INTRAVENOUS
  Filled 2012-03-27 (×10): qty 1
  Filled 2012-03-27: qty 2
  Filled 2012-03-27 (×8): qty 1

## 2012-03-27 MED ORDER — HYDROMORPHONE HCL PF 1 MG/ML IJ SOLN: 1.0000 mg | INTRAMUSCULAR | Status: DC | PRN

## 2012-03-27 MED ORDER — FLUCONAZOLE 100 MG PO TABS
100.0000 mg | ORAL_TABLET | Freq: Once | ORAL | Status: AC
  Administered 2012-03-27: 100 mg via ORAL
  Filled 2012-03-27: qty 1

## 2012-03-27 MED ORDER — DICYCLOMINE HCL 10 MG/5ML PO SOLN
10.0000 mg | Freq: Three times a day (TID) | ORAL | Status: DC
  Administered 2012-03-27 – 2012-03-28 (×9): 10 mg via ORAL
  Filled 2012-03-27 (×26): qty 5

## 2012-03-27 NOTE — Progress Notes (Signed)
PROGRESS NOTE-late entry patient seen 9 AM 4/20  Danielle [name] AVW:[id number] DOB: 15-May-1946 DOA: 03/25/2012 PCP: [name], MD, MD  Brief narrative: 66 year old female admitted with persistent nausea vomiting and diarrhea  Past medical history:Crohn's disease  Partial Small Bowel obstruction 5/12  Chronic anemia  Hiatal Hernia  Altered LFT's  Perirectal abcess I& D 4/13  Thrombocytosis  Dysphagia  Consultants:  None as yet  Procedures:  Acute abdominal series 4/19 = nonspecific bowel gas pattern with segments of small bowel distention up to 4.5 cm diameter however air seen in the colon suggesting against bowel obstruction, relatively large hiatal hernia, minimal bilateral airspace opacities could reflect on pneumonia  Antibiotics:  None   Subjective  Numerous episodes of diarrhea, still not feeling well. Not able to keep down much nauseous No chest pain or shortness breath no blurred vision no double vision   Objective   Interim History: Nursing notes reviewed  Objective: Filed Vitals:   03/26/12 0522 03/26/12 1400 03/26/12 2100 03/27/12 0525  BP: 118/72 97/64 99/63  108/71  Pulse: 115 107 114 108  Temp: 98.3 F (36.8 C) 98.5 F (36.9 C) 98.2 F (36.8 C) 98.5 F (36.9 C)  TempSrc: Oral Oral Oral Oral  Resp: 17 19 18 17   Height:      Weight:      SpO2: 100% 97% 97% 99%    Intake/Output Summary (Last 24 hours) at 03/27/12 1331 Last data filed at 03/27/12 0521  Gross per 24 hour  Intake 1757.5 ml  Output      1 ml  Net 1756.5 ml    Exam:  HEENT-Alert AAF, NAD.  CHEST-cta b, no added sound  CARDS-s1 s2 no m/r/g, rrr, ?Mild JVD, no bruit  ABD-lower abd scar. NO rebound, no guard, no rebound-mildly tender in epigastric SKIN-Mild L ankle Swelling   Data Reviewed: Basic Metabolic Panel:  Lab 03/27/12 8295 03/26/12 0545 03/25/12 1110 03/25/12 0426 03/22/12 1151  NA 138 131* -- 138 137  K 3.6 3.7 -- -- --  CL 112 105 -- 112 112  CO2 10* 11* --  -- 18*  GLUCOSE 85 67* -- 90 84  BUN 13 12 -- 16 8  CREATININE 1.19* 1.21* 1.17* 1.30* 0.99  CALCIUM 8.9 9.2 -- -- 8.8  MG -- -- -- -- --  PHOS -- -- -- -- --   Liver Function Tests: No results found for this basename: AST:5,ALT:5,ALKPHOS:5,BILITOT:5,PROT:5,ALBUMIN:5 in the last 168 hours  Lab 03/25/12 1110  LIPASE 33  AMYLASE --   No results found for this basename: AMMONIA:5 in the last 168 hours CBC:  Lab 03/27/12 0637 03/26/12 0545 03/25/12 1110 03/25/12 0426 03/25/12 0415  WBC 12.2* 19.6* 16.5* -- 15.2*  NEUTROABS 8.8* -- -- -- 11.3*  HGB 10.2* 11.4* 10.8* 11.6* 10.7*  HCT 32.3* 35.8* 33.0* 34.0* 32.2*  MCV 87.3 86.5 85.9 -- 85.2  PLT 406* 491* 440* -- 446*   Cardiac Enzymes: No results found for this basename: CKTOTAL:5,CKMB:5,CKMBINDEX:5,TROPONINI:5 in the last 168 hours BNP: No components found with this basename: POCBNP:5 CBG:  Lab 03/25/12 1236  GLUCAP 71    Recent Results (from the past 240 hour(s))  URINE CULTURE     Status: Normal   Collection Time   03/25/12  6:06 AM      Component Value Range Status Comment   Specimen Description URINE, CLEAN CATCH   Final    Special Requests NONE   Final    Culture  Setup Time [id number]  Final    Colony Count >=100,000 COLONIES/ML   Final    Culture     Final    Value: Multiple bacterial morphotypes present, none predominant. Suggest appropriate recollection if clinically indicated.   Report Status 03/26/2012 FINAL   Final   CLOSTRIDIUM DIFFICILE BY PCR     Status: Normal   Collection Time   03/25/12 11:00 AM      Component Value Range Status Comment   C difficile by pcr NEGATIVE  NEGATIVE  Final      Studies:              All Imaging reviewed and is as per above notation   Scheduled Meds:   . dicyclomine  10 mg Oral TID AC & HS  . fluconazole  100 mg Oral Once  . heparin  5,000 Units Subcutaneous Q8H  . metronidazole  500 mg Intravenous Q8H  . nystatin-triamcinolone   Topical BID  . DISCONTD:  dicyclomine  10 mg Oral TID AC & HS  . DISCONTD: dicyclomine  10 mg Oral TID AC & HS   Continuous Infusions:   . sodium chloride 75 mL/hr at 03/27/12 0940     Assessment/Plan: 1-nausea vomiting and diarrhea-unclear if this is related to her Crohn's disease. Her C. difficile was negative. I will consult gastroenterology given the fact that time and sure as to whether this is an actual flare of her Crohn's disease. Patient states that the Remicade wearing off does cause her to have a flare. We will hold antibiotics for now 2-Crohn's disease-patient is on Remicade and takes every 6 weeks. See above 3-chronic anemia-she may be hemoconcentrated given her nausea and vomiting we will try the fluid at 75 cc an hour normal saline  4-acute kidney injury/CKD 1-likely secondary to significant volume depletion her baseline creatinines less than 1 and today is about 1.3. Continue saline.  5-hypotension-resolved but still slightly hypotensive. We'll continue IV fluids. 6-GERD-patient's PPI hled pending CDIFF.  WIll repeat.   Code Status: Full  Family Communication: None at Bedside Disposition Plan: Home when stable   Pleas Koch, MD  [facility] Pager [phone] 03/27/2012, 1:31 PM    LOS: 2 days

## 2012-03-27 NOTE — Progress Notes (Signed)
PROGRESS NOTE  Danielle [name]:[id number] DOB: 09/27/1946 DOA: 03/25/2012 PCP: [name], MD, MD  Brief narrative: 66 year old female admitted with persistent nausea vomiting and diarrhea  Past medical history:Crohn's disease  Partial Small Bowel obstruction 5/12  Chronic anemia  Hiatal Hernia  Altered LFT's  Perirectal abcess I& D 4/13  Thrombocytosis  Dysphagia  Consultants:  Gastroenterology  Procedures:  Acute abdominal series 4/19 = nonspecific bowel gas pattern with segments of small bowel distention up to 4.5 cm diameter however air seen in the colon suggesting against bowel obstruction, relatively large hiatal hernia, minimal bilateral airspace opacities could reflect on pneumonia  Antibiotics:  Flagyl 04/20   Subjective  Feels nauseous but the diarrhea is significantly decreased. Abdominal pain still present. Bentyl seems to be helping. Able to tolerate some fluids this morning. Complaining of some vulvar itching and discharge Sister in room   Objective   Interim History: Nursing notes reviewed  Objective: Filed Vitals:   03/26/12 0522 03/26/12 1400 03/26/12 2100 03/27/12 0525  BP: 118/72 97/64 99/63  108/71  Pulse: 115 107 114 108  Temp: 98.3 F (36.8 C) 98.5 F (36.9 C) 98.2 F (36.8 C) 98.5 F (36.9 C)  TempSrc: Oral Oral Oral Oral  Resp: 17 19 18 17   Height:      Weight:      SpO2: 100% 97% 97% 99%    Intake/Output Summary (Last 24 hours) at 03/27/12 1337 Last data filed at 03/27/12 0521  Gross per 24 hour  Intake 1757.5 ml  Output      1 ml  Net 1756.5 ml    Exam:  HEENT-Alert AAF, NAD.  CHEST-cta b, no added sound  CARDS-s1 s2 no m/r/g, rrr, ?Mild JVD, no bruit  ABD-lower abd scar. NO rebound, no guard, no rebound-mildly tender in epigastric area SKIN-Mild L ankle Swelling   Data Reviewed: Basic Metabolic Panel:  Lab 03/27/12 2952 03/26/12 0545 03/25/12 1110 03/25/12 0426 03/22/12 1151  NA 138 131* -- 138 137  K 3.6 3.7  -- -- --  CL 112 105 -- 112 112  CO2 10* 11* -- -- 18*  GLUCOSE 85 67* -- 90 84  BUN 13 12 -- 16 8  CREATININE 1.19* 1.21* 1.17* 1.30* 0.99  CALCIUM 8.9 9.2 -- -- 8.8  MG -- -- -- -- --  PHOS -- -- -- -- --   Liver Function Tests: No results found for this basename: AST:5,ALT:5,ALKPHOS:5,BILITOT:5,PROT:5,ALBUMIN:5 in the last 168 hours  Lab 03/25/12 1110  LIPASE 33  AMYLASE --   No results found for this basename: AMMONIA:5 in the last 168 hours CBC:  Lab 03/27/12 0637 03/26/12 0545 03/25/12 1110 03/25/12 0426 03/25/12 0415  WBC 12.2* 19.6* 16.5* -- 15.2*  NEUTROABS 8.8* -- -- -- 11.3*  HGB 10.2* 11.4* 10.8* 11.6* 10.7*  HCT 32.3* 35.8* 33.0* 34.0* 32.2*  MCV 87.3 86.5 85.9 -- 85.2  PLT 406* 491* 440* -- 446*   Cardiac Enzymes: No results found for this basename: CKTOTAL:5,CKMB:5,CKMBINDEX:5,TROPONINI:5 in the last 168 hours BNP: No components found with this basename: POCBNP:5 CBG:  Lab 03/25/12 1236  GLUCAP 71    Recent Results (from the past 240 hour(s))  URINE CULTURE     Status: Normal   Collection Time   03/25/12  6:06 AM      Component Value Range Status Comment   Specimen Description URINE, CLEAN CATCH   Final    Special Requests NONE   Final    Culture  Setup Time [id number]  Final    Colony Count >=100,000 COLONIES/ML   Final    Culture     Final    Value: Multiple bacterial morphotypes present, none predominant. Suggest appropriate recollection if clinically indicated.   Report Status 03/26/2012 FINAL   Final   CLOSTRIDIUM DIFFICILE BY PCR     Status: Normal   Collection Time   03/25/12 11:00 AM      Component Value Range Status Comment   C difficile by pcr NEGATIVE  NEGATIVE  Final      Studies:              All Imaging reviewed and is as per above notation   Scheduled Meds:    . dicyclomine  10 mg Oral TID AC & HS  . fluconazole  100 mg Oral Once  . heparin  5,000 Units Subcutaneous Q8H  . metronidazole  500 mg Intravenous Q8H  .  nystatin-triamcinolone   Topical BID  . DISCONTD: dicyclomine  10 mg Oral TID AC & HS  . DISCONTD: dicyclomine  10 mg Oral TID AC & HS   Continuous Infusions:    . sodium chloride 75 mL/hr at 03/27/12 0940     Assessment/Plan: 1-nausea vomiting and diarrhea-? infectious colitis. Her C. difficile was negative. Continue IV Flagyl. Continue clear liquids. Patient states that the Remicade wearing off does cause her to have a flare. Anticipate that she will recover completely-her white count dropped from 19.6-12.2 and she has a predominant leukocytosis and neutrophilia which points to possible bacterial infection. If she is able to take by mouth better tomorrow and she switched to by mouth.  Patient wished to have hydromorphone every 3 hours for abdominal pain, but seems very comfortable also complains of 7-8 on 10 pain. 2-Crohn's disease-patient is on Remicade and takes every 6 weeks. See above. 3-chronic anemia-she may be hemoconcentrated given her nausea and vomiting we will try the fluid at 75 cc an hour normal saline  4-acute kidney injury/CKD 1-likely secondary to significant volume depletion her baseline creatinines less than 1 and today is about 1.3. Continue saline.  5-hypotension-resolved but still slightly hypotensive. We'll continue IV fluids. 6-GERD-patient's PPI hled pending CDIFF.  WIll repeat. 7-vulvar candidiasis-will give Diflucan 100 mg x1   Code Status: Full  Family Communication: None at Bedside Disposition Plan: Home when stable   Pleas Koch, MD  [facility] Pager [phone] 03/27/2012, 1:37 PM    LOS: 2 days

## 2012-03-27 NOTE — Progress Notes (Signed)
[facility] Progress Note  Subjective: Still complaining of diarrhea with one episode of slight vomiting. Receiving Bentyl when necessary now which has helped her at home. She's requesting to get her nausea and pain medicines on the same scheduled together.  Objective: Vital signs in last 24 hours: Temp:  [98.2 F (36.8 C)-98.5 F (36.9 C)] 98.5 F (36.9 C) (04/21 0525) Pulse Rate:  [107-114] 108  (04/21 0525) Resp:  [17-19] 17  (04/21 0525) BP: (97-108)/(63-71) 108/71 mmHg (04/21 0525) SpO2:  [97 %-99 %] 99 % (04/21 0525) Weight change:    PE: Unchanged  Lab Results: Results for orders placed during the hospital encounter of 03/25/12 (from the past 24 hour(s))  BASIC METABOLIC PANEL     Status: Abnormal   Collection Time   03/27/12  6:37 AM      Component Value Range   Sodium 138  135 - 145 (mEq/L)   Potassium 3.6  3.5 - 5.1 (mEq/L)   Chloride 112  96 - 112 (mEq/L)   CO2 10 (*) 19 - 32 (mEq/L)   Glucose, Bld 85  70 - 99 (mg/dL)   BUN 13  6 - 23 (mg/dL)   Creatinine, Ser 4.09 (*) 0.50 - 1.10 (mg/dL)   Calcium 8.9  8.4 - 81.1 (mg/dL)   GFR calc non Af Amer 47 (*) >90 (mL/min)   GFR calc Af Amer 55 (*) >90 (mL/min)  CBC     Status: Abnormal   Collection Time   03/27/12  6:37 AM      Component Value Range   WBC 12.2 (*) 4.0 - 10.5 (K/uL)   RBC 3.70 (*) 3.87 - 5.11 (MIL/uL)   Hemoglobin 10.2 (*) 12.0 - 15.0 (g/dL)   HCT 91.4 (*) 78.2 - 46.0 (%)   MCV 87.3  78.0 - 100.0 (fL)   MCH 27.6  26.0 - 34.0 (pg)   MCHC 31.6  30.0 - 36.0 (g/dL)   RDW 95.6  21.3 - 08.6 (%)   Platelets 406 (*) 150 - 400 (K/uL)  DIFFERENTIAL     Status: Abnormal   Collection Time   03/27/12  6:37 AM      Component Value Range   Neutrophils Relative 72  43 - 77 (%)   Lymphocytes Relative 16  12 - 46 (%)   Monocytes Relative 11  3 - 12 (%)   Eosinophils Relative 1  0 - 5 (%)   Basophils Relative 0  0 - 1 (%)   Neutro Abs 8.8 (*) 1.7 - 7.7 (K/uL)   Lymphs Abs 2.0  0.7 - 4.0 (K/uL)   Monocytes Absolute 1.3 (*) 0.1 - 1.0 (K/uL)   Eosinophils Absolute 0.1  0.0 - 0.7 (K/uL)   Basophils Absolute 0.0  0.0 - 0.1 (K/uL)   WBC Morphology ATYPICAL LYMPHOCYTES     Smear Review LARGE PLATELETS PRESENT      Studies/Results: No results found.    Assessment: Nausea vomiting and diarrhea unclear whether due to an infectious etiology or flare of Crohn's disease or antibiotic related  Plan: Continue IV Flagyl at least another 2 days before initiating any further workup which would probably be in the form of a CT scan if she does not improve. We'll continue to follow with you.    Dilara Navarrete C 03/27/2012, 8:41 AM

## 2012-03-28 ENCOUNTER — Inpatient Hospital Stay (HOSPITAL_COMMUNITY): Payer: Medicare Other

## 2012-03-28 ENCOUNTER — Encounter (HOSPITAL_COMMUNITY): Payer: Self-pay | Admitting: Radiology

## 2012-03-28 ENCOUNTER — Ambulatory Visit (INDEPENDENT_AMBULATORY_CARE_PROVIDER_SITE_OTHER): Payer: Medicare Other | Admitting: Internal Medicine

## 2012-03-28 MED ORDER — PANTOPRAZOLE SODIUM 40 MG IV SOLR
40.0000 mg | INTRAVENOUS | Status: DC
  Administered 2012-03-28 – 2012-03-30 (×3): 40 mg via INTRAVENOUS
  Filled 2012-03-28 (×4): qty 40

## 2012-03-28 MED ORDER — IOHEXOL 300 MG/ML  SOLN
100.0000 mL | Freq: Once | INTRAMUSCULAR | Status: AC | PRN
  Administered 2012-03-28: 100 mL via INTRAVENOUS

## 2012-03-28 MED ORDER — PROMETHAZINE HCL 25 MG/ML IJ SOLN
12.5000 mg | INTRAMUSCULAR | Status: DC | PRN
  Administered 2012-03-28 – 2012-04-01 (×15): 12.5 mg via INTRAVENOUS
  Filled 2012-03-28 (×17): qty 1

## 2012-03-28 NOTE — Progress Notes (Signed)
Started general medical care plan for pt.  Previous care plan (pneumonia pathway) was not applicable for pt.

## 2012-03-28 NOTE — Progress Notes (Signed)
   CARE MANAGEMENT NOTE 03/28/2012  Patient:  Danielle [name], Danielle [name]   Account Number:  [id number]  Date Initiated:  03/28/2012  Documentation initiated by:  Letha Cape  Subjective/Objective Assessment:   dx diarrhea, uti  admit- lives with sister.     Action/Plan:   ct scan for inflammation  pt eval   Anticipated DC Date:  03/30/2012   Anticipated DC Plan:  HOME W HOME HEALTH SERVICES      DC Planning Services  CM consult      Choice offered to / List presented to:             Status of service:  In process, will continue to follow Medicare Important Message given?   (If response is "NO", the following Medicare IM given date fields will be blank) Date Medicare IM given:   Date Additional Medicare IM given:    Discharge Disposition:    Per UR Regulation:    If discussed at Long Length of Stay Meetings, dates discussed:    Comments:  03/28/12 11:07 Letha Cape RN, BSN [phone] patient lives with sister, await pt eval.  Patient has medication coverage and transportation.  NCM will continue to follow for dc needs.

## 2012-03-28 NOTE — Progress Notes (Signed)
Patient ID: Danielle [name], female   DOB: 04-01-1946, 66 y.o.   MRN: [id number]  PROGRESS NOTE  Danielle [name] JYN:[id number] DOB: [birth year] DOA: 03/25/2012 PCP: [name], MD, MD  Brief narrative: 66 year old female admitted with persistent nausea vomiting and diarrhea  Past medical history:Crohn's disease  Partial Small Bowel obstruction 5/12  Chronic anemia  Hiatal Hernia  Altered LFT's  Perirectal abcess I& D 4/13  Thrombocytosis  Dysphagia  Consultants:  Gastroenterology  Procedures:  Acute abdominal series 4/19 = nonspecific bowel gas pattern with segments of small bowel distention up to 4.5 cm diameter however air seen in the colon suggesting against bowel obstruction, relatively large hiatal hernia, minimal bilateral airspace opacities could reflect on pneumonia  Antibiotics:  Flagyl 04/20   Subjective  Feels nauseous but the diarrhea is significantly decreased. Abdominal pain still present. Bentyl seems to be helping. Able to tolerate some fluids this morning. Complaining of some vulvar itching and discharge Sister in room   Objective   Interim History: Nursing notes reviewed  Objective: Filed Vitals:   03/27/12 0525 03/27/12 1410 03/27/12 2130 03/28/12 0534  BP: 108/71 92/60 115/75 104/69  Pulse: 108 98 108 109  Temp: 98.5 F (36.9 C) 98.1 F (36.7 C) 98 F (36.7 C) 98.2 F (36.8 C)  TempSrc: Oral Oral Oral Oral  Resp: 17 18 18 18   Height:      Weight:      SpO2: 99% 99% 96% 99%    Intake/Output Summary (Last 24 hours) at 03/28/12 1138 Last data filed at 03/28/12 0600  Gross per 24 hour  Intake 2068.75 ml  Output      0 ml  Net 2068.75 ml    Exam:  HEENT-Alert AAF, NAD.  CHEST-cta b, no added sound  CARDS-s1 s2 no m/r/g, rrr, ?Mild JVD, no bruit  ABD-lower abd scar. NO rebound, no guard, no rebound-mildly tender in epigastric area SKIN-Mild L ankle Swelling   Data Reviewed: Basic Metabolic Panel:  Lab 03/27/12 8657 03/26/12  0545 03/25/12 1110 03/25/12 0426 03/22/12 1151  NA 138 131* -- 138 137  K 3.6 3.7 -- -- --  CL 112 105 -- 112 112  CO2 10* 11* -- -- 18*  GLUCOSE 85 67* -- 90 84  BUN 13 12 -- 16 8  CREATININE 1.19* 1.21* 1.17* 1.30* 0.99  CALCIUM 8.9 9.2 -- -- 8.8  MG -- -- -- -- --  PHOS -- -- -- -- --   Liver Function Tests: No results found for this basename: AST:5,ALT:5,ALKPHOS:5,BILITOT:5,PROT:5,ALBUMIN:5 in the last 168 hours  Lab 03/25/12 1110  LIPASE 33  AMYLASE --   No results found for this basename: AMMONIA:5 in the last 168 hours CBC:  Lab 03/27/12 0637 03/26/12 0545 03/25/12 1110 03/25/12 0426 03/25/12 0415  WBC 12.2* 19.6* 16.5* -- 15.2*  NEUTROABS 8.8* -- -- -- 11.3*  HGB 10.2* 11.4* 10.8* 11.6* 10.7*  HCT 32.3* 35.8* 33.0* 34.0* 32.2*  MCV 87.3 86.5 85.9 -- 85.2  PLT 406* 491* 440* -- 446*   Cardiac Enzymes: No results found for this basename: CKTOTAL:5,CKMB:5,CKMBINDEX:5,TROPONINI:5 in the last 168 hours BNP: No components found with this basename: POCBNP:5 CBG:  Lab 03/25/12 1236  GLUCAP 71    Recent Results (from the past 240 hour(s))  URINE CULTURE     Status: Normal   Collection Time   03/25/12  6:06 AM      Component Value Range Status Comment   Specimen Description URINE, CLEAN CATCH   Final  Special Requests NONE   Final    Culture  Setup Time [id number]   Final    Colony Count >=100,000 COLONIES/ML   Final    Culture     Final    Value: Multiple bacterial morphotypes present, none predominant. Suggest appropriate recollection if clinically indicated.   Report Status 03/26/2012 FINAL   Final   STOOL CULTURE     Status: Normal (Preliminary result)   Collection Time   03/25/12 11:00 AM      Component Value Range Status Comment   Specimen Description STOOL   Final    Special Requests NONE   Final    Culture NO SUSPICIOUS COLONIES, CONTINUING TO HOLD   Final    Report Status PENDING   Incomplete   CLOSTRIDIUM DIFFICILE BY PCR     Status: Normal    Collection Time   03/25/12 11:00 AM      Component Value Range Status Comment   C difficile by pcr NEGATIVE  NEGATIVE  Final      Studies:              All Imaging reviewed and is as per above notation   Scheduled Meds:    . dicyclomine  10 mg Oral TID AC & HS  . heparin  5,000 Units Subcutaneous Q8H  . metronidazole  500 mg Intravenous Q8H  . nystatin-triamcinolone   Topical BID   Continuous Infusions:    . sodium chloride 75 mL/hr at 03/28/12 0939     Assessment/Plan: Nausea vomiting and diarrhea No vomiting over night.  Several episodes of watery diarrhea, and diffuse abdominal pain.  Appreciate Dr. Marge Duncans recommendations.  Will await CT Enterography results.  If no inflammation will advance diet.  Crohn's disease patient is on Remicade and takes every 6 weeks. To receive a dose prior to d/c per Dr. Bosie Clos.  Chronic normocytic anemia likely chronic disease.  Acute kidney injury/CKD 1 likely secondary to significant volume depletion her baseline creatinines less than 1. Continue IVF.   Hypotension resolved but still slightly hypotensive. We'll continue IV fluids.  GERD Added back PPI IV - will progress to PO as diet advances.  Vulvar candidiasis have given Diflucan 100 mg x1  UTI Culture shows multiple morphotypes.  Will repeat u/a and culture today as initial u/a appeared strongly positive.  Code Status: Full   Disposition Plan: PT/OT Evaluation.  Likely home when stable.  Algis Downs, PA-C [facility] Pager: [phone]  03/28/2012, 11:38 AM    LOS: 3 days

## 2012-03-28 NOTE — Progress Notes (Signed)
Patient ID: Danielle [name], female   DOB: 01/20/1946, 66 y.o.   MRN: [id number] [facility] Progress Note  Danielle [name] 66 y.o. [birth year]   Subjective: Abdominal pain persisting and taking IV Dilaudid Q 3 hours. Reports 2-3 watery to loose stools overnight. Denies N/V. Tolerating clears. Reports that Remicade is Q 6 weeks and next infusion due 03/31/12 per pt.  Objective: Vital signs: Filed Vitals:   03/28/12 0534  BP: 104/69  Pulse: 109  Temp: 98.2 F (36.8 C)  Resp: 18    Intake/Output last 24 hrs:  Intake/Output Summary (Last 24 hours) at 03/28/12 0835 Last data filed at 03/28/12 0600  Gross per 24 hour  Intake 2068.75 ml  Output      0 ml  Net 2068.75 ml    Physical Exam: Gen: alert, no acute distress  Abd: tender periumbilically and right mid-quadrant with voluntary guarding, soft, nondistended  Lab Results:  [facility] 03/27/12 0637 03/26/12 0545  NA 138 131*  K 3.6 3.7  CL 112 105  CO2 10* 11*  GLUCOSE 85 67*  BUN 13 12  CREATININE 1.19* 1.21*  CALCIUM 8.9 9.2  MG -- --  PHOS -- --   No results found for this basename: AST:2,ALT:2,ALKPHOS:2,BILITOT:2,PROT:2,ALBUMIN:2 in the last 72 hours  Basename 03/27/12 0637 03/26/12 0545  WBC 12.2* 19.6*  NEUTROABS 8.8* --  HGB 10.2* 11.4*  HCT 32.3* 35.8*  MCV 87.3 86.5  PLT 406* 491*     Assessment/Plan: 65yo BF with Crohn's disease and persisting diarrhea and abdominal pain despite empiric Flagyl. Leucocytosis has resolved. Stool studies negative for infection. Source of diarrhea likely IBS more so than Crohn's flare since Bentyl helps her pain. Due to persistent pain, will do an abd/pelvic CT and if no inflammation seen will stop Flagyl in case symptoms due to antibiotics. Will not advance diet until CT results back and if no inflammation will advance as tolerated. Patient wants her Remicade and treatment transferred to [facility] and will defer to her primary GI doctor, Dr. Karilyn Cota and Dr. Madilyn Fireman  who she says she has seen in our office before. Will need narcotic pain meds weaned by primary team in anticipation of possible d/c in next 1-2 days vs. give Remicade as an inpt before possible d/c this week.   Yitzchok Carriger C. 03/28/2012, 8:35 AM

## 2012-03-28 NOTE — Progress Notes (Signed)
PT Cancellation Note  Treatment cancelled today due to patient's refusal to participate. Pt reports she does not feel well enough to get out of the bed today. Will f/u tomorrow.   [facility] HELEN 03/28/2012, 11:47 AM Pager: [phone]

## 2012-03-28 NOTE — Progress Notes (Signed)
Addendum  Patient seen and examined, chart and data base reviewed.  I agree with the above assessment and plan  For full details please see Mrs. Algis Downs PA. Note.  Clint Lipps Pager: [phone] 03/28/2012, 12:52 PM

## 2012-03-29 ENCOUNTER — Encounter (HOSPITAL_COMMUNITY): Payer: Self-pay | Admitting: General Surgery

## 2012-03-29 DIAGNOSIS — K56609 Unspecified intestinal obstruction, unspecified as to partial versus complete obstruction: Secondary | ICD-10-CM

## 2012-03-29 LAB — BASIC METABOLIC PANEL
CO2: 12 mEq/L — ABNORMAL LOW (ref 19–32)
Calcium: 9.1 mg/dL (ref 8.4–10.5)
Chloride: 114 mEq/L — ABNORMAL HIGH (ref 96–112)
Potassium: 2.9 mEq/L — ABNORMAL LOW (ref 3.5–5.1)
Sodium: 139 mEq/L (ref 135–145)

## 2012-03-29 LAB — STOOL CULTURE

## 2012-03-29 MED ORDER — POTASSIUM CHLORIDE 2 MEQ/ML IV SOLN
INTRAVENOUS | Status: DC
  Administered 2012-03-29 – 2012-04-01 (×5): via INTRAVENOUS
  Filled 2012-03-29 (×8): qty 1000

## 2012-03-29 MED ORDER — ACETAMINOPHEN 650 MG RE SUPP
650.0000 mg | RECTAL | Status: DC | PRN
  Administered 2012-03-29: 650 mg via RECTAL
  Filled 2012-03-29: qty 1

## 2012-03-29 MED ORDER — CIPROFLOXACIN IN D5W 400 MG/200ML IV SOLN
400.0000 mg | Freq: Two times a day (BID) | INTRAVENOUS | Status: DC
  Administered 2012-03-29 – 2012-04-01 (×7): 400 mg via INTRAVENOUS
  Filled 2012-03-29 (×9): qty 200

## 2012-03-29 MED ORDER — POTASSIUM CHLORIDE 10 MEQ/100ML IV SOLN
10.0000 meq | INTRAVENOUS | Status: AC
  Administered 2012-03-29 (×2): 10 meq via INTRAVENOUS
  Filled 2012-03-29 (×3): qty 100

## 2012-03-29 MED ORDER — POTASSIUM CHLORIDE 10 MEQ/100ML IV SOLN
10.0000 meq | Freq: Once | INTRAVENOUS | Status: AC
  Administered 2012-03-29: 10 meq via INTRAVENOUS

## 2012-03-29 NOTE — Consult Note (Signed)
Long d/w patient & d/w partners (Many of [place] have seen her in the past)  This is a complicated situation. I would like to get prior records to see what prior bowel surgery the done. The frequency of her readmission seems to be accelerating. This admission is not as rapid return on as before. I concern the need for surgeries becoming imminent since the small bowel is more chronically dilated and others fecal is anxious. Don't have a long-term resolution. I did discuss numerous options. We have not maximize not on management. I think it is reasonable to do a trial of steroids to see if things opened up. I would add Cipro to Flagyl as well given this role history or colitis. Is not long segment stricture, however has been present for while and dilatation seems worse.  She does have a giant parasol for 2 hiatal hernia. Ideally that should be addressed at some point. However having the Meridia that on top of this is not wise. Her symptoms raise some suspicion with nausea and vomiting. However, given her high risks of morbidity, think we should focus to the most obvious problem in the intestine. I would like to make sure that there is no other intestine the small or large is not incarcerated and mediastinum menisci appear 72 hiatal hernia as well. Endoscopy has been done at the suspicious left lower colonic stricture that did not prove to exist but I may see a look at that area as well. Discussed the technique of surgery with her:  The anatomy & physiology of the digestive tract was discussed.  The pathophysiology of bowel obstruction was discussed.  Differential diagnosis such as perforated ulcer or colon, etc was discussed.   Natural history risks without surgery such as death was discussed.  I recommended abdominal exploration to diagnose & treat the source of the problem.  Laparoscopic & open techniques were discussed.   Risks such as bleeding, infection, abscess, leak, reoperation, bowel resection, possible  ostomy, hernia, heart attack, death, and other risks were discussed.   The risks of no intervention will lead to serious problems including death.   I expressed a good likelihood that surgery will address the problem.    Goals of post-operative recovery were discussed as well.  We will work to minimize complications although risks in an emergent setting are high.   Questions were answered.  The patient expressed understanding & wishes to consider surgery if no improvement with oral ABX & steroids.  Tenitively plan Friday if fails management

## 2012-03-29 NOTE — Consult Note (Signed)
Danielle [name] 05/14/1946  [id number].   Primary GI MD: Dr. Karilyn Cota Requesting MD: Dr. Arthor Captain Chief Complaint/Reason for Consult: PSBO HPI: This is a 66 yo female with a history of crohn's disease who has had multiple abdominal operations by Dr. Gabriel Cirri in [place].  We do not have his operative reports so it's difficult to know exactly what all she has had done.  It appears she has had a SBR, colectomy, colostomy, and reversal.  If she has had other operations it's unclear just what all else she had done.    She intermittently gets admitted here to [facility] for abdominal pain and PSBOs.  She usually resolves fairly quickly, but continues to have this recurrence.  She takes Remicade every 6 weeks per Dr. Karilyn Cota.  The patient was admitted this past Friday with similar complaints.  She ultimately had a CTE that showed a high grade PSBO with 7cm SB and proximal dilatation with proximal fecalization.  She however, is hungry, denies nausea or vomiting, and is passing stool and flatus.  We have been asked to evaluate for further recommendations regarding this PSBO.   Review of Systems: See HPI, otherwise all other systems reviewed and are negative.  She still has a wound on her buttock from recent I&D of perirectal abscess.  Denies reflux symptoms or abdominal pain.  Family History  Problem Relation Age of Onset  . Diabetes Mother   . Healthy Sister   . Hypertension Brother   . Colon cancer Brother     died with colon cancer    Past Medical History  Diagnosis Date  . Crohn's disease   . Chronic diarrhea   . Joint pain   . GERD (gastroesophageal reflux disease)   . Polyarthralgia   . Shortness of breath   . Pneumonia   . Anemia   . Hernia (acquired) (recurrent)     hiatal  . Hiatal hernia   . Back pain     Past Surgical History  Procedure Date  . Bowel resection   . Colostomy     [name]  . Anal stenosis   . Colonoscopy 2000    needs to have one  . Cholecystectomy   . Appendectomy    . Goiter 1999  . Esophagogastroduodenoscopy 11/11/2011    Procedure: ESOPHAGOGASTRODUODENOSCOPY (EGD);  Surgeon: Barrie Folk, MD;  Location[facility] ENDOSCOPY;  Service: Endoscopy;  Laterality: N/A;  . Flexible sigmoidoscopy 11/11/2011    Procedure: FLEXIBLE SIGMOIDOSCOPY;  Surgeon: Barrie Folk, MD;  Location: [facility] ENDOSCOPY;  Service: Endoscopy;  Laterality: N/A;    Social History:  reports that she quit smoking about 15 years ago. Her smoking use included Cigarettes. She quit after 4 years of use. She has never used smokeless tobacco. She reports that she does not drink alcohol or use illicit drugs.  Allergies:  Allergies  Allergen Reactions  . Aspirin Other (See Comments)    Clamps her stomach  . Sulfa Antibiotics Nausea And Vomiting    Medications Prior to Admission  Medication Sig Dispense Refill  . Calcium-Vitamin D 600-200 MG-UNIT per tablet Take 1 tablet by mouth daily.  30 tablet  3  . Cyanocobalamin (VITAMIN B-12 IJ) Inject as directed every 30 (thirty) days.        [name] dicyclomine (BENTYL) 10 MG capsule Take 1 capsule (10 mg total) by mouth 3 (three) times daily.  90 capsule  3  . HYDROcodone-acetaminophen (LORTAB) 7.5-500 MG per tablet Take 1 tablet by mouth every 8 (eight) hours  as needed. For pain.  45 tablet  0  . inFLIXimab (REMICADE) 100 MG injection Inject 100 mg into the vein every 6 (six) weeks. Last infusion was in October , next infusion to be 01-07-12      . multivitamin [facility]) per tablet Take 1 tablet by mouth daily.  30 tablet  3  . nystatin-triamcinolone (MYCOLOG II) cream APPLY TOPICALLY 4 TIMES A DAY  30 g  0  . pantoprazole (PROTONIX) 40 MG tablet Take 1 tablet (40 mg total) by mouth daily.  30 tablet  5  . POTASSIUM PO Take 40 mEq by mouth 2 (two) times daily.      . Probiotic Product (PROBIOTIC FORMULA PO) Take 1 tablet by mouth daily.       . promethazine (PHENERGAN) 25 MG tablet Take 1 tablet (25 mg total) by mouth every 6 (six) hours as needed. Nausea  30  tablet  0  . spironolactone (ALDACTONE) 50 MG tablet Take 1 tablet (50 mg total) by mouth daily.  30 tablet  3  . zolpidem (AMBIEN) 10 MG tablet Take 1 tablet (10 mg total) by mouth as needed. insomnia  30 tablet  0    Blood pressure 103/64, pulse 118, temperature 101.1 F (38.4 C), temperature source Oral, resp. rate 18, height 5\' 4"  (1.626 m), weight 152 lb (68.947 kg), SpO2 94.00%. Physical Exam: General: pleasant, female who is laying in bed in NAD HEENT: head is normocephalic, atraumatic.  Sclera are noninjected.  PERRL.  Ears and nose without any masses or lesions.  Mouth is pink and moist Heart: regular, rate, and rhythm.  Normal s1,s2. No obvious murmurs, gallops, or rubs noted.  Palpable radial and pedal pulses bilaterally Lungs: CTAB, no wheezes, rhonchi, or rales noted.  Respiratory effort nonlabored Abd: soft, NT, ND, +BS, no masses, hernias, or organomegaly.  She has multiple scars on her abdomen from her prior surgeries. Rectal: wound still present from recent I&D.  She does have some purulent drainage noted. MS: all 4 extremities are symmetrical with no cyanosis, clubbing, or edema. Skin: warm and dry with no masses, lesions, or rashes Psych: A&Ox3 with an appropriate affect.    Results for orders placed during the hospital encounter of 03/25/12 (from the past 48 hour(s))  BASIC METABOLIC PANEL     Status: Abnormal   Collection Time   03/29/12  5:57 AM      Component Value Range Comment   Sodium 139  135 - 145 (mEq/L)    Potassium 2.9 (*) 3.5 - 5.1 (mEq/L)    Chloride 114 (*) 96 - 112 (mEq/L)    CO2 12 (*) 19 - 32 (mEq/L)    Glucose, Bld 112 (*) 70 - 99 (mg/dL)    BUN 7  6 - 23 (mg/dL)    Creatinine, Ser 1.61 (*) 0.50 - 1.10 (mg/dL)    Calcium 9.1  8.4 - 10.5 (mg/dL)    GFR calc non Af Amer 47 (*) >90 (mL/min)    GFR calc Af Amer 55 (*) >90 (mL/min)    Ct Entero Abd/pelvis W/cm  03/28/2012  *RADIOLOGY REPORT*  Clinical Data:  Crohn's disease, abdominal pain, concern  for active Crohn's disease.  CT ABDOMEN AND PELVIS WITH CONTRAST (CT ENTEROGRAPHY)  Technique:  Multidetector CT of the abdomen and pelvis during bolus administration of intravenous contrast. Negative oral contrast VoLumen was given.  Contrast: OMNIPAQUE IOHEXOL 300 MG/ML  SOLN  Comparison:  CT scan 12/20/2011  Findings:  Gastrointestinal:  There  is  a large hiatal hernia present.  This hernia sac appears to contain not only the stomach but a loop of small bowel.  Entirety of the hernia not is not imaged but the gas esophageal junction appears to be above the diaphragm.  Duodenum appears normal. The small bowel is dilated significantly up to 7 cm.  This is increased from 5 cm on comparison exam.  There is dilatation is secondary to a stricture within the midabdomen over approximately 2 cm segment of small bowel (image 29, series 8 and image 79, series 7).  Proximal to the stricture there is stasis of enteric contents as well as dilatation of the small bowel.  There is second region of potential narrowing involving the more distal loops of small bowel in the left abdomen (image 46, series 2).  No significant obstruction at this level.  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  The distal small bowel/colon appear normal.  No evidence of pneumatosis or intraperitoneal free air.  Lung bases are clear.  No pericardial fluid.  No focal hepatic lesion.  The gallbladder is absent.  The pancreas, spleen, adrenal glands, kidneys are normal.  Abdominal aorta normal caliber.  No retroperitoneal periportal lymphadenopathy.  No free fluid the pelvis.  The uterus and ovaries are unremarkable. The bladder is normal. Review of  bone windows demonstrates no aggressive osseous lesions.  IMPRESSION:  1.  Worsening of high-grade partial small bowel obstruction with a segment of tight stricturing in the mid small bowel with significant dilatation proximal to the stricture as well as enteric stasis. 2.  Large hiatal hernia which  contains stomach and a loop of small bowel. 3.  Potential areas of stricturing within the more distal small bowel in the left abdomen without evidence of significant obstruction.  Original Report Authenticated By: Genevive Bi, M.D.       Assessment/Plan 1. High grade PSBO 2. Crohn's disease 3. Hiatal hernia  Plan: 1. Dr. Michaell Cowing and I saw the patient together.  This is a difficult situation to determine what the exact plan is as the patient continues to have recurrent symptoms despite high powered crohn's medication, but her episodes seem to resolved quickly once in the hospital.  She obviously has a significant stricture causing massive small bowel dilatation.  We have had a lengthy d/w the patient regarding surgery vs conservative management.  She is leaning more towards trying conservative management initially and if that doesn't work, then proceeding with surgical intervention.  We have expressed concern that it is likely that she will not resolve this without surgical intervention.  She understands.  We have also discussed that if we proceed with an operation, given her immunocompromised state because of her Remicade and possible use of steroids within the next day or so, that that will put her at increased risk for surgical complications such as delayed wound healing, anastomotic leak, etc.  She also understands this.  We will see if we can get old surgical records from Dr. Gabriel Cirri at [facility]. 2. Dr. Michaell Cowing is going to discuss this with some of the partners as well as Dr. Bosie Clos.  In the meantime, he would like to add Cipro to her abx regimen.  She states she is starving and wants to eat.  Given she has no nausea, vomiting, and is still passing flatus, he will allow full/pureed diet. 3. We will add sitz bathes and dressing changes for her perirectal wound. 4. We have also d/w the patient the possibility  of a hiatal hernia repair at the time of surgery, if that's where we end up; however this  is not a definite.  The patient is currently asymptomatic from this and may defer repair at this time and focus on primary problem. 5. Further recommendations per Dr. Michaell Cowing after his d/w other surgeons and Dr. Bosie Clos. Mounir Skipper E 03/29/2012, 1:14 PM

## 2012-03-29 NOTE — Progress Notes (Signed)
Addendum  Patient seen and examined, chart and data base reviewed.  I agree with the above assessment and plan  For full details please see Mrs. Algis Downs PA. Note.  CT scan findings noted, Gen surgery consulted.  Clint Lipps Pager: [phone] 03/29/2012, 5:00 PM

## 2012-03-29 NOTE — Progress Notes (Signed)
OT Cancellation Note  Treatment cancelled today due to medical issues with patient which prohibited therapy. Will reattempt when pt. Medically stable.  Shonte Soderlund, OTR/L Pager [phone] 03/29/2012, 12:43 PM

## 2012-03-29 NOTE — Progress Notes (Signed)
Patient ID: Danielle [name], female   DOB: 1946/10/29, 66 y.o.   MRN: [id number]  PCP: [name], MD, MD  Brief narrative: 66 year old female with history of severe Crohns and multiple abdominal surgeries, admitted with persistent nausea vomiting and diarrhea.  Found to have multiple GI strictures on CT enterography, one of which is high grade.  Consultants:  Gastroenterology, [facility] Surgery  Procedures:    Antibiotics:  Flagyl 04/20  Cipro 04/23   Subjective  Decreased pain today.  "I just feel weak"   Objective   Interim History: Nursing notes reviewed  Objective: Filed Vitals:   03/27/12 2130 03/28/12 0534 03/28/12 2133 03/29/12 0517  BP: 115/75 104/69 102/68 103/64  Pulse: 108 109 108 118  Temp: 98 F (36.7 C) 98.2 F (36.8 C) 98.6 F (37 C) 101.1 F (38.4 C)  TempSrc: Oral Oral Oral Oral  Resp: 18 18 18 18   Height:      Weight:      SpO2: 96% 99% 99% 94%    Intake/Output Summary (Last 24 hours) at 03/29/12 1356 Last data filed at 03/29/12 0600  Gross per 24 hour  Intake   2342 ml  Output      0 ml  Net   2342 ml    Exam:  HEENT-Alert AAF, NAD.  Lungs-cta b, no added sound  CV-s1 s2 no m/r/g, rrr Abdomen-lower abd scarring. NO rebound, no guard, no rebound-mildly tender in epigastric area Extremities:  Mild lower extremity edema.  Data Reviewed: Basic Metabolic Panel:  Lab 03/29/12 1324 03/27/12 0637 03/26/12 0545 03/25/12 1110 03/25/12 0426  NA 139 138 131* -- 138  K 2.9* 3.6 -- -- --  CL 114* 112 105 -- 112  CO2 12* 10* 11* -- --  GLUCOSE 112* 85 67* -- 90  BUN 7 13 12  -- 16  CREATININE 1.19* 1.19* 1.21* 1.17* 1.30*  CALCIUM 9.1 8.9 9.2 -- --  MG -- -- -- -- --  PHOS -- -- -- -- --    Lab 03/25/12 1110  LIPASE 33  AMYLASE --   CBC:  Lab 03/27/12 0637 03/26/12 0545 03/25/12 1110 03/25/12 0426 03/25/12 0415  WBC 12.2* 19.6* 16.5* -- 15.2*  NEUTROABS 8.8* -- -- -- 11.3*  HGB 10.2* 11.4* 10.8* 11.6* 10.7*    HCT 32.3* 35.8* 33.0* 34.0* 32.2*  MCV 87.3 86.5 85.9 -- 85.2  PLT 406* 491* 440* -- 446*   CBG:  Lab 03/25/12 1236  GLUCAP 71    Recent Results (from the past 240 hour(s))  URINE CULTURE     Status: Normal   Collection Time   03/25/12  6:06 AM      Component Value Range Status Comment   Specimen Description URINE, CLEAN CATCH   Final    Special Requests NONE   Final    Culture  Setup Time [id number]   Final    Colony Count >=100,000 COLONIES/ML   Final    Culture     Final    Value: Multiple bacterial morphotypes present, none predominant. Suggest appropriate recollection if clinically indicated.   Report Status 03/26/2012 FINAL   Final   STOOL CULTURE     Status: Normal   Collection Time   03/25/12 11:00 AM      Component Value Range Status Comment   Specimen Description STOOL   Final    Special Requests NONE   Final    Culture     Final    Value: NO  SALMONELLA, SHIGELLA, CAMPYLOBACTER, OR YERSINIA ISOLATED   Report Status 03/29/2012 FINAL   Final   CLOSTRIDIUM DIFFICILE BY PCR     Status: Normal   Collection Time   03/25/12 11:00 AM      Component Value Range Status Comment   C difficile by pcr NEGATIVE  NEGATIVE  Final      Studies:  03/28/12 CT Enterography Abdomen/Pelvis:  IMPRESSION:  1. Worsening of high-grade partial small bowel obstruction with a segment of tight stricturing in the mid small bowel with  significant dilatation proximal to the stricture as well as enteric stasis.  2. Large hiatal hernia which contains stomach and a loop of small bowel.  3. Potential areas of stricturing within the more distal small bowel in the left abdomen without evidence of significant obstruction.  Scheduled Meds:    . ciprofloxacin  400 mg Intravenous Q12H  . dicyclomine  10 mg Oral TID AC & HS  . heparin  5,000 Units Subcutaneous Q8H  . metronidazole  500 mg Intravenous Q8H  . nystatin-triamcinolone   Topical BID  . pantoprazole (PROTONIX) IV  40 mg Intravenous Q24H   . potassium chloride  10 mEq Intravenous Q1 Hr x 3   Continuous Infusions:    . sodium chloride 0.9 % 1,000 mL with potassium chloride 40 mEq infusion 100 mL/hr at 03/29/12 1055  . DISCONTD: sodium chloride 75 mL/hr at 03/28/12 1914     Assessment/Plan: Nausea vomiting and diarrhea Still with Nausea and Watery diarrhea.  Pain improved.  On sips/chips.  CT Entero shows multiple strictures one of which is high grade.  Per GI these are likely related to CD.  Considering Steroids to try to release strictures.  Per [facility] would like further discussion with GI, considering steroids.  Would like to obtain previous surgical records.  Will attempt conservative management for a few days, but may ultimately end up needing surgery.  Fever On flagyl, cipro added today.  Attempting to obtain a repeat u/a (in / out cath).  CXR at admission mentioned possible small opacities, but the patient has no upper respiratory or pna symptoms.  Will place order for blood cultures if temp greater than 101.  Crohn's disease patient is on Remicade and takes every 6 weeks. Will hold Remicade at this point per Dr. Bosie Clos, may worsen stricturing.  Chronic normocytic anemia likely chronic disease.  Acute kidney injury/CKD 1 likely secondary to significant volume depletion her baseline creatinines less than 1. Continue IVF.   Hypotension resolved but still slightly hypotensive. We'll continue IV fluids.  GERD Added back PPI IV - will progress to PO as diet advances.  Vulvar candidiasis have given Diflucan 100 mg x1  UTI Culture shows multiple morphotypes.  Will repeat u/a and culture today as initial u/a appeared strongly positive.  Hypokalemia Likely due to diarrhea.  Will replete and check bmet in am.  Code Status: Full   Disposition Plan: PT/OT Evaluation.  Likely home when stable.  Algis Downs, PA-C [facility] Pager: [phone]  03/29/2012, 1:56 PM    LOS: 4 days

## 2012-03-29 NOTE — Progress Notes (Signed)
PT Cancellation Note   03/29/12 1144  PT Visit Information  Last PT Attempted On 03/29/12  Reason Eval/Treat Not Completed Patient not medically ready    [facility] PT [phone]

## 2012-03-29 NOTE — Progress Notes (Signed)
Patient ID: Danielle [name], female   DOB: 1946/07/02, 66 y.o.   MRN: [id number] [facility] Gastroenterology Progress Note  Danielle [name] 66 y.o. [birth year]   Subjective: Nausea this morning despite Phenergan Q 4 hours. No vomiting. Denies any abdominal pain this morning and no pain meds since early last night. CT enterography reviewed and showed a high-grade mid-small bowel stricture causing obstruction as well as a distal small bowel stricture without obstruction (see CT report for complete details).  Objective: Vital signs: Filed Vitals:   03/29/12 0517  BP: 103/64  Pulse: 118  Temp: 101.1 F (38.4 C)  Resp: 18    Intake/Output last 24 hrs:  Intake/Output Summary (Last 24 hours) at 03/29/12 0903 Last data filed at 03/29/12 0600  Gross per 24 hour  Intake   2802 ml  Output      0 ml  Net   2802 ml    Physical Exam: Gen: alert, no acute distress  Abd: soft, non-tender; bowel sounds normal; no masses,  no organomegaly  Lab Results:  Basename 03/29/12 0557 03/27/12 0637  NA 139 138  K 2.9* 3.6  CL 114* 112  CO2 12* 10*  GLUCOSE 112* 85  BUN 7 13  CREATININE 1.19* 1.19*  CALCIUM 9.1 8.9  MG -- --  PHOS -- --   No results found for this basename: AST:2,ALT:2,ALKPHOS:2,BILITOT:2,PROT:2,ALBUMIN:2 in the last 72 hours  Basename 03/27/12 0637  WBC 12.2*  NEUTROABS 8.8*  HGB 10.2*  HCT 32.3*  MCV 87.3  PLT 406*     Medications: I have reviewed the patient's current medications.  Assessment/Plan: 65yo with severe Crohn's on maintenance Remicade with a CT enterography showing a high-grade stricture in the mid-small bowel that is likely due to her Crohn's (adhesions less likely as source). Would not give Remicade that is due this week since that could worsen the stricture. Starting IV steroids is an option to see if that would help clear the stricture and avoid surgery but due to potential affect on healing will not order until she is seen by [facility]. She is not vomiting  and not showing any clinical signs of a complete SBO so will hold off on IV steroids until seen by surgery in case surgery is planned and then will defer steroids decision to them. Continue Flagyl for now. D/C clear liquid diet and put on sips of clears. NG tube may be needed to help with nausea and distention of small bowel but pt wants to avoid. Continue supportive care. Will follow. Primary team has already called surgical consult.  Danielle Huston C. 03/29/2012, 9:03 AM

## 2012-03-30 LAB — BASIC METABOLIC PANEL
Chloride: 114 mEq/L — ABNORMAL HIGH (ref 96–112)
GFR calc non Af Amer: 50 mL/min — ABNORMAL LOW (ref >90)
Glucose, Bld: 83 mg/dL (ref 70–99)
Potassium: 3.7 mEq/L (ref 3.5–5.1)
Sodium: 135 mEq/L (ref 135–145)

## 2012-03-30 LAB — CBC
HCT: 29.1 % — ABNORMAL LOW (ref 36.0–46.0)
Hemoglobin: 9.5 g/dL — ABNORMAL LOW (ref 12.0–15.0)
MCH: 27.6 pg (ref 26.0–34.0)
RBC: 3.44 MIL/uL — ABNORMAL LOW (ref 3.87–5.11)

## 2012-03-30 LAB — URINALYSIS, ROUTINE W REFLEX MICROSCOPIC
Glucose, UA: NEGATIVE mg/dL
Protein, ur: NEGATIVE mg/dL
Specific Gravity, Urine: 1.011 (ref 1.005–1.030)
pH: 6 (ref 5.0–8.0)

## 2012-03-30 LAB — URINE MICROSCOPIC-ADD ON

## 2012-03-30 MED ORDER — PREDNISONE 20 MG PO TABS
40.0000 mg | ORAL_TABLET | Freq: Every day | ORAL | Status: DC
  Administered 2012-03-30 – 2012-04-01 (×3): 40 mg via ORAL
  Filled 2012-03-30 (×4): qty 2

## 2012-03-30 NOTE — Progress Notes (Addendum)
03/30/2012 Spoke with OT.  Patient refused to work with OT this afternoon.  PT already checked on patient in AM.  PT/OT to check back tomorrow.  Thanks, Rollene Rotunda. Jenise Iannelli, PT, DPT [phone]

## 2012-03-30 NOTE — Progress Notes (Signed)
Patient ID: Danielle [name], female   DOB: 10/10/1946, 66 y.o.   MRN: [id number] [facility] Gastroenterology Progress Note  Danielle [name] 66 y.o. 31-Jan-1946   Subjective: Diarrhea gone. Abdominal pain much better compared with yesterday. Denies N/V. Refusing to eat pureed diet. Wants something with more form but acknowledges that she does not want to eat too much.  Objective: Vital signs: Filed Vitals:   03/30/12 0900  BP: 90/60  Pulse: 112  Temp: 99.3 F (37.4 C)  Resp: 24    Intake/Output last 24 hrs:  Intake/Output Summary (Last 24 hours) at 03/30/12 1230 Last data filed at 03/30/12 0905  Gross per 24 hour  Intake   1640 ml  Output      0 ml  Net   1640 ml    Physical Exam: Gen: alert, no acute distress  Abd: less tenderness with periumbilical guarding, soft, ND, +BS  Lab Results:  [facility] 03/30/12 0610 03/29/12 0557  NA 135 139  K 3.7 2.9*  CL 114* 114*  CO2 11* 12*  GLUCOSE 83 112*  BUN 5* 7  CREATININE 1.13* 1.19*  CALCIUM 8.5 9.1  MG -- --  PHOS -- --   No results found for this basename: AST:2,ALT:2,ALKPHOS:2,BILITOT:2,PROT:2,ALBUMIN:2 in the last 72 hours  Basename 03/30/12 0610  WBC 15.0*  NEUTROABS --  HGB 9.5*  HCT 29.1*  MCV 84.6  PLT 329     Medications: I have reviewed the patient's current medications.  Assessment/Plan: 66yo with Crohn's disease involving the small bowel and 2 SB strictures one that is causing a high grade PSBO. Clinically she does not have any signs of obstruction. Will put on a soft diet and add PO Prednisone 40 mg/day. Continue Cipro/Flagyl. Surgery recs noted. Advance diet tomorrow if tolerates soft diet. Will follow.   Danielle Mizrahi C. 03/30/2012, 12:30 PM

## 2012-03-30 NOTE — Progress Notes (Signed)
PT Cancellation Note  Treatment cancelled today due to patient's refusal to participate.  The patient states, "I have too much going on today." of those items listed was a bath. I explained to her why PT/walking was so important while in the hospital to prevent weakness, other medical complications and to help stimulate the bowels.  She continued to refuse, but was agreeable for PT to check back this afternoon as time allows.   Rollene Rotunda Ocean Schildt, PT, DPT [phone] 03/30/2012, 9:28 AM

## 2012-03-30 NOTE — Progress Notes (Signed)
PATIENT DETAILS Name: Danielle [name] Age: 66 y.o. Sex: female Date of Birth: Jun 22, 1946 Admit Date: 03/25/2012 ZOX:[id number] K, MD, MD  Subjective: Anxious to go home, wants to avoid surgery  Objective: Vital signs in last 24 hours: Filed Vitals:   03/30/12 0444 03/30/12 0900 03/30/12 1342 03/30/12 1431  BP: 92/60 90/60 92/60  95/61  Pulse: 98 112 92 103  Temp: 98.7 F (37.1 C) 99.3 F (37.4 C) 98.6 F (37 C) 98.2 F (36.8 C)  TempSrc: Oral Oral Oral   Resp: 20 24 22 19   Height:      Weight:      SpO2: 99% 96% 100% 100%    Weight change:   Body mass index is 26.09 kg/(m^2).  Intake/Output from previous day:  Intake/Output Summary (Last 24 hours) at 03/30/12 1703 Last data filed at 03/30/12 1500  Gross per 24 hour  Intake   2830 ml  Output   1072 ml  Net   1758 ml    PHYSICAL EXAM: Gen Exam: Awake and alert with clear speech.   Neck: Supple, No JVD.   Chest: B/L Clear.   CVS: S1 S2 Regular, no murmurs.  Abdomen: soft, BS +, non tender, non distended.  Extremities: no edema, lower extremities warm to touch. Neurologic: Non Focal.   Skin: No Rash.   Wounds: N/A.    CONSULTS:  GI and general surgery  LAB RESULTS: CBC  Lab 03/30/12 0610 03/27/12 0637 03/26/12 0545 03/25/12 1110 03/25/12 0426 03/25/12 0415  WBC 15.0* 12.2* 19.6* 16.5* -- 15.2*  HGB 9.5* 10.2* 11.4* 10.8* 11.6* --  HCT 29.1* 32.3* 35.8* 33.0* 34.0* --  PLT 329 406* 491* 440* -- 446*  MCV 84.6 87.3 86.5 85.9 -- 85.2  MCH 27.6 27.6 27.5 28.1 -- 28.3  MCHC 32.6 31.6 31.8 32.7 -- 33.2  RDW 14.5 14.2 14.3 14.2 -- 14.1  LYMPHSABS -- 2.0 -- -- -- 2.5  MONOABS -- 1.3* -- -- -- 1.2*  EOSABS -- 0.1 -- -- -- 0.2  BASOSABS -- 0.0 -- -- -- 0.0  BANDABS -- -- -- -- -- --    Chemistries   Lab 03/30/12 0610 03/29/12 0557 03/27/12 0637 03/26/12 0545 03/25/12 1110 03/25/12 0426  NA 135 139 138 131* -- 138  K 3.7 2.9* 3.6 3.7 -- 3.6  CL 114* 114* 112 105 -- 112  CO2 11* 12* 10* 11* -- --    GLUCOSE 83 112* 85 67* -- 90  BUN 5* 7 13 12  -- 16  CREATININE 1.13* 1.19* 1.19* 1.21* 1.17* --  CALCIUM 8.5 9.1 8.9 9.2 -- --  MG -- -- -- -- -- --    GFR Estimated Creatinine Clearance: 48 ml/min (by C-G formula based on Cr of 1.13).  Coagulation profile  Lab 03/26/12 0545  INR 1.15  PROTIME --    Cardiac Enzymes No results found for this basename: CK:3,CKMB:3,TROPONINI:3,MYOGLOBIN:3 in the last 168 hours  No components found with this basename: POCBNP:3 No results found for this basename: DDIMER:2 in the last 72 hours No results found for this basename: HGBA1C:2 in the last 72 hours No results found for this basename: CHOL:2,HDL:2,LDLCALC:2,TRIG:2,CHOLHDL:2,LDLDIRECT:2 in the last 72 hours No results found for this basename: TSH,T4TOTAL,FREET3,T3FREE,THYROIDAB in the last 72 hours No results found for this basename: VITAMINB12:2,FOLATE:2,FERRITIN:2,TIBC:2,IRON:2,RETICCTPCT:2 in the last 72 hours No results found for this basename: LIPASE:2,AMYLASE:2 in the last 72 hours  Urine Studies No results found for this basename: UACOL:2,UAPR:2,USPG:2,UPH:2,UTP:2,UGL:2,UKET:2,UBIL:2,UHGB:2,UNIT:2,UROB:2,ULEU:2,UEPI:2,UWBC:2,URBC:2,UBAC:2,CAST:2,CRYS:2,UCOM:2,BILUA:2 in the last 72 hours  MICROBIOLOGY: Recent Results (  from the past 240 hour(s))  URINE CULTURE     Status: Normal   Collection Time   03/25/12  6:06 AM      Component Value Range Status Comment   Specimen Description URINE, CLEAN CATCH   Final    Special Requests NONE   Final    Culture  Setup Time [id number]   Final    Colony Count >=100,000 COLONIES/ML   Final    Culture     Final    Value: Multiple bacterial morphotypes present, none predominant. Suggest appropriate recollection if clinically indicated.   Report Status 03/26/2012 FINAL   Final   STOOL CULTURE     Status: Normal   Collection Time   03/25/12 11:00 AM      Component Value Range Status Comment   Specimen Description STOOL   Final    Special  Requests NONE   Final    Culture     Final    Value: NO SALMONELLA, SHIGELLA, CAMPYLOBACTER, OR YERSINIA ISOLATED   Report Status 03/29/2012 FINAL   Final   CLOSTRIDIUM DIFFICILE BY PCR     Status: Normal   Collection Time   03/25/12 11:00 AM      Component Value Range Status Comment   C difficile by pcr NEGATIVE  NEGATIVE  Final     RADIOLOGY STUDIES/RESULTS: Ct Entero Abd/pelvis W/cm  03/28/2012  *RADIOLOGY REPORT*  Clinical Data:  Crohn's disease, abdominal pain, concern for active Crohn's disease.  CT ABDOMEN AND PELVIS WITH CONTRAST (CT ENTEROGRAPHY)  Technique:  Multidetector CT of the abdomen and pelvis during bolus administration of intravenous contrast. Negative oral contrast VoLumen was given.  Contrast: OMNIPAQUE IOHEXOL 300 MG/ML  SOLN  Comparison:  CT scan 12/20/2011  Findings:  Gastrointestinal:  There is  a large hiatal hernia present.  This hernia sac appears to contain not only the stomach but a loop of small bowel.  Entirety of the hernia not is not imaged but the gas esophageal junction appears to be above the diaphragm.  Duodenum appears normal. The small bowel is dilated significantly up to 7 cm.  This is increased from 5 cm on comparison exam.  There is dilatation is secondary to a stricture within the midabdomen over approximately 2 cm segment of small bowel (image 29, series 8 and image 79, series 7).  Proximal to the stricture there is stasis of enteric contents as well as dilatation of the small bowel.  There is second region of potential narrowing involving the more distal loops of small bowel in the left abdomen (image 46, series 2).  No significant obstruction at this level.  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  The distal small bowel/colon appear normal.  No evidence of pneumatosis or intraperitoneal free air.  Lung bases are clear.  No pericardial fluid.  No focal hepatic lesion.  The gallbladder is absent.  The pancreas, spleen, adrenal glands, kidneys are  normal.  Abdominal aorta normal caliber.  No retroperitoneal periportal lymphadenopathy.  No free fluid the pelvis.  The uterus and ovaries are unremarkable. The bladder is normal. Review of  bone windows demonstrates no aggressive osseous lesions.  IMPRESSION:  1.  Worsening of high-grade partial small bowel obstruction with a segment of tight stricturing in the mid small bowel with significant dilatation proximal to the stricture as well as enteric stasis. 2.  Large hiatal hernia which contains stomach and a loop of small bowel. 3.  Potential areas of stricturing within  the more distal small bowel in the left abdomen without evidence of significant obstruction.  Original Report Authenticated By: Genevive Bi, M.D.   Dg Abd Acute W/chest  03/25/2012  *RADIOLOGY REPORT*  Clinical Data: Vomiting, diarrhea and body aches.  ACUTE ABDOMEN SERIES (ABDOMEN 2 VIEW & CHEST 1 VIEW)  Comparison: Chest and abdominal radiographs performed 12/15/2011  Findings: The lungs are well-aerated.  Minimal bilateral airspace opacities could reflect mild pneumonia.  There is no evidence of pleural effusion or pneumothorax.  The cardiomediastinal silhouette is within normal limits.  A relatively large hiatal hernia is noted, with an air-fluid level. A nonspecific bowel gas pattern is noted, with segments of small bowel distension, measuring up to 4.5 cm in diameter, and associated air-fluid levels.  However, air is also seen within the colon.  The pattern suggests against bowel obstruction.  No free intra-abdominal air is identified on the provided upright view.  No acute osseous abnormalities are seen; the sacroiliac joints are unremarkable in appearance.  Scattered postoperative change is noted along the right side of the abdomen.  IMPRESSION:  1.  Nonspecific bowel gas pattern, with segments of small bowel distension, measuring up to 4.5 cm in diameter, and associated air- fluid levels.  However, air also seen within the colon.   The pattern suggests against bowel obstruction.  No free intra- abdominal air seen. 2.  Relatively large hiatal hernia noted. 3.  Minimal bilateral airspace opacities could reflect mild pneumonia.  Original Report Authenticated By: Tonia Ghent, M.D.    MEDICATIONS: Scheduled Meds:   . ciprofloxacin  400 mg Intravenous Q12H  . dicyclomine  10 mg Oral TID AC & HS  . heparin  5,000 Units Subcutaneous Q8H  . metronidazole  500 mg Intravenous Q8H  . nystatin-triamcinolone   Topical BID  . pantoprazole (PROTONIX) IV  40 mg Intravenous Q24H  . potassium chloride  10 mEq Intravenous Q1 Hr x 3  . potassium chloride  10 mEq Intravenous Once  . predniSONE  40 mg Oral Q breakfast   Continuous Infusions:   . sodium chloride 0.9 % 1,000 mL with potassium chloride 40 mEq infusion 100 mL/hr at 03/30/12 0515   PRN Meds:.acetaminophen, HYDROmorphone (DILAUDID) injection, promethazine, zolpidem  Antibiotics: Anti-infectives     Start     Dose/Rate Route Frequency Ordered Stop   03/29/12 1400   ciprofloxacin (CIPRO) IVPB 400 mg        400 mg 200 mL/hr over 60 Minutes Intravenous Every 12 hours 03/29/12 1312     03/27/12 1100   fluconazole (DIFLUCAN) tablet 100 mg        100 mg Oral  Once 03/27/12 1014 03/27/12 1109   03/26/12 1300   metroNIDAZOLE (FLAGYL) IVPB 500 mg        500 mg 100 mL/hr over 60 Minutes Intravenous Every 8 hours 03/26/12 1146     03/25/12 0615   cefTRIAXone (ROCEPHIN) 1 g in dextrose 5 % 50 mL IVPB        1 g 100 mL/hr over 30 Minutes Intravenous  Once 03/25/12 0613 03/25/12 0703   03/25/12 0615   azithromycin (ZITHROMAX) 500 mg in dextrose 5 % 250 mL IVPB  Status:  Discontinued        500 mg 250 mL/hr over 60 Minutes Intravenous  Once 03/25/12 2130 03/25/12 0900          Assessment/Plan: Patient Active Hospital Problem List: 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 -current plans are to  monitor clinical course-advancing diet slowly  Fever -this was on 4/23 -has been afebrile since -monitor for now-repeat UA-not consistent with UTI -monitor-already on Cipro and Flagyl  Crohn's disease  patient is on Remicade and takes every 6 weeks. Will hold Remicade at this point per Dr. Bosie Clos, may worsen stricturing.   Chronic normocytic anemia  likely chronic disease.  Hypotension  BP soft, but patient asymptomatic, monitor for now  GERD  Added back PPI IV - will progress to PO as diet advances.   Hypokalemia  Likely due to diarrhea.  Monitor lytes periodically  Disposition: Remain inpatient  DVT Prophylaxis: Prophylactic heparin  Code Status: Full Code  Maretta Bees,  MD. 03/30/2012, 5:03 PM

## 2012-03-30 NOTE — Progress Notes (Signed)
Pt feeling better Again I am concerned that surgery will be needed at some point, but no hurry.  High OR risk, so caution is warranted & utilizing all non-surgical Tx helpful

## 2012-03-30 NOTE — Progress Notes (Signed)
Patient refuse packing, Notified PA with surgery and Ok to continue with Sitz bath. Wound with tan drainage in moderate amount.  Patient also with large amout of urine output in I and O cath.  Notified medical PA. No other need patient is stable.

## 2012-03-30 NOTE — Progress Notes (Signed)
Patient ID: Danielle [name], female   DOB: February 08, 1946, 66 y.o.   MRN: [id number]    Subjective: Pt feels much better today she says.  She has no nausea.  She is passing flatus, but no BM today so far.  Objective: Vital signs in last 24 hours: Temp:  [98.3 F (36.8 C)-98.7 F (37.1 C)] 98.7 F (37.1 C) (04/24 0444) Pulse Rate:  [93-101] 98  (04/24 0444) Resp:  [19-20] 20  (04/24 0444) BP: (91-103)/(60-68) 92/60 mmHg (04/24 0444) SpO2:  [98 %-100 %] 99 % (04/24 0444) Last BM Date: 03/29/12  Intake/Output from previous day: 04/23 0701 - 04/24 0700 In: 1640 [P.O.:240; I.V.:1000; IV Piggyback:400] Out: -  Intake/Output this shift:    PE: Abd: soft, Nt, ND, +BS Heart: regular Lungs: CTAB  Lab Results:   Basename 03/30/12 0610  WBC 15.0*  HGB 9.5*  HCT 29.1*  PLT 329   BMET  Basename 03/29/12 0557  NA 139  K 2.9*  CL 114*  CO2 12*  GLUCOSE 112*  BUN 7  CREATININE 1.19*  CALCIUM 9.1   PT/INR No results found for this basename: LABPROT:2,INR:2 in the last 72 hours CMP     Component Value Date/Time   NA 139 03/29/2012 0557   K 2.9* 03/29/2012 0557   CL 114* 03/29/2012 0557   CO2 12* 03/29/2012 0557   GLUCOSE 112* 03/29/2012 0557   BUN 7 03/29/2012 0557   CREATININE 1.19* 03/29/2012 0557   CREATININE 0.99 03/22/2012 1151   CALCIUM 9.1 03/29/2012 0557   PROT 7.7 12/15/2011 1818   ALBUMIN 3.1* 12/15/2011 1818   AST 35 12/15/2011 1818   ALT 42* 12/15/2011 1818   ALKPHOS 107 12/15/2011 1818   BILITOT 0.3 12/15/2011 1818   GFRNONAA 47* 03/29/2012 0557   GFRAA 55* 03/29/2012 0557   Lipase     Component Value Date/Time   LIPASE 33 03/25/2012 1110       Studies/Results: Ct Entero Abd/pelvis W/cm  03/28/2012  *RADIOLOGY REPORT*  Clinical Data:  Crohn's disease, abdominal pain, concern for active Crohn's disease.  CT ABDOMEN AND PELVIS WITH CONTRAST (CT ENTEROGRAPHY)  Technique:  Multidetector CT of the abdomen and pelvis during bolus administration of intravenous contrast.  Negative oral contrast VoLumen was given.  Contrast: OMNIPAQUE IOHEXOL 300 MG/ML  SOLN  Comparison:  CT scan 12/20/2011  Findings:  Gastrointestinal:  There is  a large hiatal hernia present.  This hernia sac appears to contain not only the stomach but a loop of small bowel.  Entirety of the hernia not is not imaged but the gas esophageal junction appears to be above the diaphragm.  Duodenum appears normal. The small bowel is dilated significantly up to 7 cm.  This is increased from 5 cm on comparison exam.  There is dilatation is secondary to a stricture within the midabdomen over approximately 2 cm segment of small bowel (image 29, series 8 and image 79, series 7).  Proximal to the stricture there is stasis of enteric contents as well as dilatation of the small bowel.  There is second region of potential narrowing involving the more distal loops of small bowel in the left abdomen (image 46, series 2).  No significant obstruction at this level.  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  The distal small bowel/colon appear normal.  No evidence of pneumatosis or intraperitoneal free air.  Lung bases are clear.  No pericardial fluid.  No focal hepatic lesion.  The gallbladder is absent.  The pancreas, spleen, adrenal glands, kidneys are normal.  Abdominal aorta normal caliber.  No retroperitoneal periportal lymphadenopathy.  No free fluid the pelvis.  The uterus and ovaries are unremarkable. The bladder is normal. Review of  bone windows demonstrates no aggressive osseous lesions.  IMPRESSION:  1.  Worsening of high-grade partial small bowel obstruction with a segment of tight stricturing in the mid small bowel with significant dilatation proximal to the stricture as well as enteric stasis. 2.  Large hiatal hernia which contains stomach and a loop of small bowel. 3.  Potential areas of stricturing within the more distal small bowel in the left abdomen without evidence of significant obstruction.  Original  Report Authenticated By: Genevive Bi, M.D.    Anti-infectives: Anti-infectives     Start     Dose/Rate Route Frequency Ordered Stop   03/29/12 1400   ciprofloxacin (CIPRO) IVPB 400 mg        400 mg 200 mL/hr over 60 Minutes Intravenous Every 12 hours 03/29/12 1312     03/27/12 1100   fluconazole (DIFLUCAN) tablet 100 mg        100 mg Oral  Once 03/27/12 1014 03/27/12 1109   03/26/12 1300   metroNIDAZOLE (FLAGYL) IVPB 500 mg        500 mg 100 mL/hr over 60 Minutes Intravenous Every 8 hours 03/26/12 1146     03/25/12 0615   cefTRIAXone (ROCEPHIN) 1 g in dextrose 5 % 50 mL IVPB        1 g 100 mL/hr over 30 Minutes Intravenous  Once 03/25/12 0613 03/25/12 0703   03/25/12 0615   azithromycin (ZITHROMAX) 500 mg in dextrose 5 % 250 mL IVPB  Status:  Discontinued        500 mg 250 mL/hr over 60 Minutes Intravenous  Once 03/25/12 0613 03/25/12 0900           Assessment/Plan  1. SB stricture, likely secondary to crohn's  Plan: 1. Have d/w Dr. Michaell Cowing and patient.  The patient would like to try conservative management first with IV abx and steroids.  If this does not make any significant improvements, then we will look at OR on Friday.  I have d/w the patient.  She is in complete agreement at this time.   LOS: 5 days    Habeeb Puertas E 03/30/2012

## 2012-03-30 NOTE — Progress Notes (Signed)
OT Note: Pt. Adamantly refusing therapy and getting OOB. Stating "I will not get out of bed today, I will do anything you want tomorrow but not today". MD present in room and aware of pt's refusal for OOB. Will reattempt 03/31/12.  Cassandria Anger, OTR/L Pager: [phone] 03/30/2012 .

## 2012-03-31 ENCOUNTER — Encounter (HOSPITAL_COMMUNITY): Payer: Medicare Other | Attending: Oncology

## 2012-03-31 DIAGNOSIS — K5090 Crohn's disease, unspecified, without complications: Secondary | ICD-10-CM | POA: Insufficient documentation

## 2012-03-31 LAB — BASIC METABOLIC PANEL
CO2: 11 mEq/L — ABNORMAL LOW (ref 19–32)
Glucose, Bld: 133 mg/dL — ABNORMAL HIGH (ref 70–99)
Potassium: 3.8 mEq/L (ref 3.5–5.1)
Sodium: 135 mEq/L (ref 135–145)

## 2012-03-31 LAB — CBC
Hemoglobin: 9 g/dL — ABNORMAL LOW (ref 12.0–15.0)
MCH: 27.4 pg (ref 26.0–34.0)
RBC: 3.28 MIL/uL — ABNORMAL LOW (ref 3.87–5.11)

## 2012-03-31 LAB — URINE CULTURE

## 2012-03-31 MED ORDER — PANTOPRAZOLE SODIUM 40 MG PO TBEC
40.0000 mg | DELAYED_RELEASE_TABLET | Freq: Every day | ORAL | Status: DC
  Administered 2012-03-31 – 2012-04-01 (×2): 40 mg via ORAL
  Filled 2012-03-31 (×2): qty 1

## 2012-03-31 NOTE — Progress Notes (Signed)
Patient refused dressing changed,located at perirectal area ,preceptor Verdon Cummins RN notified and Hospitalist Dr.Ghimire notified,no new order given. Per Patient ,her Surgeon will check wound on Friday.

## 2012-03-31 NOTE — Evaluation (Signed)
Physical Therapy Evaluation Patient Details Name: Danielle [name] MRN: [id number] DOB: 07-25-1946 Today's Date: 03/31/2012 Time: 4098-1191 PT Time Calculation (min): 20 min  PT Assessment / Plan / Recommendation Clinical Impression  Pt adm with partial small bowel obstruction.  Pt moving slowly but adequately for return home with family.  No equipment or follow -up needed at dc.    PT Assessment  Patient needs continued PT services    Follow Up Recommendations  No PT follow up    Equipment Recommendations  None recommended by PT    Frequency Min 3X/week    Precautions / Restrictions Precautions Precautions: Fall Restrictions Weight Bearing Restrictions: No   Pertinent Vitals/Pain N/A      Mobility  Bed Mobility Bed Mobility: Sit to Supine Supine to Sit: 6: Modified independent (Device/Increase time);HOB flat Sitting - Scoot to Edge of Bed: 6: Modified independent (Device/Increase time) Sit to Supine: 6: Modified independent (Device/Increase time);HOB flat Details for Bed Mobility Assistance: requires incr time Transfers Sit to Stand: 5: Supervision Stand to Sit: 5: Supervision Details for Transfer Assistance: Incr time Ambulation/Gait Ambulation/Gait Assistance: 5: Supervision Ambulation Distance (Feet): 225 Feet Assistive device: None Gait Pattern: Decreased stride length;Narrow base of support Gait velocity: slow cadence    Exercises     PT Goals Acute Rehab PT Goals PT Goal Formulation: With patient Time For Goal Achievement: 04/07/12 Potential to Achieve Goals: Good Pt will go Sit to Stand: with modified independence PT Goal: Sit to Stand - Progress: Goal set today Pt will go Stand to Sit: with modified independence PT Goal: Stand to Sit - Progress: Goal set today Pt will Ambulate: >150 feet;with modified independence PT Goal: Ambulate - Progress: Goal set today  Visit Information  Last PT Received On: 03/31/12 Assistance Needed: +1    Subjective  Data  Subjective: "I hope they let me go home Saturday," pt stated. Patient Stated Goal: Go home   Prior Functioning  Home Living Lives With: Family Available Help at Discharge: Family;Available 24 hours/day Type of Home: House Home Access: Stairs to enter [employer] of Steps: 4 Entrance Stairs-Rails: Right Home Layout: One level Bathroom Shower/Tub: Walk-in shower;Door [employer] Toilet: Handicapped height Home Adaptive Equipment: Built-in shower seat;Grab bars around toilet;Grab bars in shower Prior Function Level of Independence: Independent Able to Take Stairs?: Yes Driving: Yes Vocation: Retired Musician: No difficulties    Cognition  Overall Cognitive Status: Appears within functional limits for tasks assessed/performed Arousal/Alertness: Awake/alert Orientation Level: Appears intact for tasks assessed;Oriented X4 / Intact Behavior During Session: [facility] for tasks performed    Extremity/Trunk Assessment Right Lower Extremity Assessment RLE ROM/Strength/Tone: Within functional levels Left Lower Extremity Assessment LLE ROM/Strength/Tone: Within functional levels   Balance Static Standing Balance Static Standing - Level of Assistance: 5: Stand by assistance  End of Session PT - End of Session Activity Tolerance: Patient tolerated treatment well Patient left: in bed;with call bell/phone within reach;with bed alarm set   [facility] 03/31/2012, 2:10 PM  [employer] PT [phone]

## 2012-03-31 NOTE — Progress Notes (Signed)
Subjective:  Doing well, tolerated regular diet  Objective:  Vital signs in last 24 hours:  Filed Vitals:    03/30/12 2315  03/31/12 0609  03/31/12 1013  03/31/12 1252   BP:  89/57  86/56  92/64  102/72   Pulse:  85  76  100  84   Temp:  97.9 F (36.6 C)  97.7 F (36.5 C)  97.6 F (36.4 C)  97.5 F (36.4 C)   TempSrc:  Oral  Oral  Oral  Oral   Resp:  18  18  20  22    Height:       Weight:       SpO2:  98%  100%  99%  100%    Weight change:  Body mass index is 26.09 kg/(m^2).  Intake/Output from previous day:   Intake/Output Summary (Last 24 hours) at 03/31/12 1411 Last data filed at 03/31/12 0955   Gross per 24 hour   Intake  3316.67 ml   Output  1151 ml   Net  2165.67 ml    PHYSICAL EXAM:  Gen Exam: Awake and alert with clear speech.  Neck: Supple, No JVD.  Chest: B/L Clear.  CVS: S1 S2 Regular, no murmurs.  Abdomen: soft, BS +, non tender, non distended.  Extremities: no edema, lower extremities warm to touch.  Neurologic: Non Focal.  Skin: No Rash.  Wounds: N/A.  CONSULTS:  GI and general surgery  LAB RESULTS:  CBC   Lab  03/31/12 0548  03/30/12 0610  03/27/12 0637  03/26/12 0545  03/25/12 1110  03/25/12 0415   WBC  10.6*  15.0*  12.2*  19.6*  16.5*  --   HGB  9.0*  9.5*  10.2*  11.4*  10.8*  --   HCT  27.3*  29.1*  32.3*  35.8*  33.0*  --   PLT  308  329  406*  491*  440*  --   MCV  83.2  84.6  87.3  86.5  85.9  --   MCH  27.4  27.6  27.6  27.5  28.1  --   MCHC  33.0  32.6  31.6  31.8  32.7  --   RDW  14.4  14.5  14.2  14.3  14.2  --   LYMPHSABS  --  --  2.0  --  --  2.5   MONOABS  --  --  1.3*  --  --  1.2*   EOSABS  --  --  0.1  --  --  0.2   BASOSABS  --  --  0.0  --  --  0.0   BANDABS  --  --  --  --  --  --    Chemistries   Lab  03/31/12 0548  03/30/12 0610  03/29/12 0557  03/27/12 0637  03/26/12 0545   NA  135  135  139  138  131*   K  3.8  3.7  2.9*  3.6  3.7   CL  115*  114*  114*  112  105   CO2  11*  11*  12*  10*  11*   GLUCOSE  133*   83  112*  85  67*   BUN  7  5*  7  13  12    CREATININE  1.02  1.13*  1.19*  1.19*  1.21*   CALCIUM  8.4  8.5  9.1  8.9  9.2   MG  --  --  --  --  --  GFR  Estimated Creatinine Clearance: 53.1 ml/min (by C-G formula based on Cr of 1.02).  Coagulation profile   Lab  03/26/12 0545   INR  1.15   PROTIME  --    Cardiac Enzymes  No results found for this basename: CK:3,CKMB:3,TROPONINI:3,MYOGLOBIN:3 in the last 168 hours  No components found with this basename: POCBNP:3  No results found for this basename: DDIMER:2 in the last 72 hours  No results found for this basename: HGBA1C:2 in the last 72 hours  No results found for this basename: CHOL:2,HDL:2,LDLCALC:2,TRIG:2,CHOLHDL:2,LDLDIRECT:2 in the last 72 hours  No results found for this basename: TSH,T4TOTAL,FREET3,T3FREE,THYROIDAB in the last 72 hours  No results found for this basename: VITAMINB12:2,FOLATE:2,FERRITIN:2,TIBC:2,IRON:2,RETICCTPCT:2 in the last 72 hours  No results found for this basename: LIPASE:2,AMYLASE:2 in the last 72 hours  Urine Studies  No results found for this basename: UACOL:2,UAPR:2,USPG:2,UPH:2,UTP:2,UGL:2,UKET:2,UBIL:2,UHGB:2,UNIT:2,UROB:2,ULEU:2,UEPI:2,UWBC:2,URBC:2,UBAC:2,CAST:2,CRYS:2,UCOM:2,BILUA:2 in the last 72 hours  MICROBIOLOGY:  Recent Results (from the past 240 hour(s))   URINE CULTURE Status: Normal    Collection Time    03/25/12 6:06 AM   Component  Value  Range  Status  Comment    Specimen Description  URINE, CLEAN CATCH   Final     Special Requests  NONE   Final     Culture Setup Time  [id number]   Final     Colony Count  >=100,000 COLONIES/ML   Final     Culture    Final     Value:  Multiple bacterial morphotypes present, none predominant. Suggest appropriate recollection if clinically indicated.    Report Status  03/26/2012 FINAL   Final    STOOL CULTURE Status: Normal    Collection Time    03/25/12 11:00 AM   Component  Value  Range  Status  Comment    Specimen Description  STOOL    Final     Special Requests  NONE   Final     Culture    Final     Value:  NO SALMONELLA, SHIGELLA, CAMPYLOBACTER, OR YERSINIA ISOLATED    Report Status  03/29/2012 FINAL   Final    CLOSTRIDIUM DIFFICILE BY PCR Status: Normal    Collection Time    03/25/12 11:00 AM   Component  Value  Range  Status  Comment    C difficile by pcr  NEGATIVE  NEGATIVE  Final    URINE CULTURE Status: Normal    Collection Time    03/30/12 11:17 AM   Component  Value  Range  Status  Comment    Specimen Description  URINE, CLEAN CATCH   Final     Special Requests  NONE   Final     Culture Setup Time  [id number]   Final     Colony Count  NO GROWTH   Final     Culture  NO GROWTH   Final     Report Status  03/31/2012 FINAL   Final     RADIOLOGY STUDIES/RESULTS:  Ct Entero Abd/pelvis W/cm  03/28/2012 *RADIOLOGY REPORT* Clinical Data: Crohn's disease, abdominal pain, concern for active Crohn's disease. CT ABDOMEN AND PELVIS WITH CONTRAST (CT ENTEROGRAPHY) Technique: Multidetector CT of the abdomen and pelvis during bolus administration of intravenous contrast. Negative oral contrast VoLumen was given. Contrast: OMNIPAQUE IOHEXOL 300 MG/ML SOLN Comparison: CT scan 12/20/2011 Findings: Gastrointestinal: There is a large hiatal hernia present. This hernia sac appears to contain not only the stomach but a loop of small bowel. Entirety of the hernia not is  not imaged but the gas esophageal junction appears to be above the diaphragm. Duodenum appears normal. The small bowel is dilated significantly up to 7 cm. This is increased from 5 cm on comparison exam. There is dilatation is secondary to a stricture within the midabdomen over approximately 2 cm segment of small bowel (image 29, series 8 and image 79, series 7). Proximal to the stricture there is stasis of enteric contents as well as dilatation of the small bowel. There is second region of potential narrowing involving the more distal loops of small bowel in the left  abdomen (image 46, series 2). No significant obstruction at this level. 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 The distal small bowel/colon appear normal. No evidence of pneumatosis or intraperitoneal free air. Lung bases are clear. No pericardial fluid. No focal hepatic lesion. The gallbladder is absent. The pancreas, spleen, adrenal glands, kidneys are normal. Abdominal aorta normal caliber. No retroperitoneal periportal lymphadenopathy. No free fluid the pelvis. The uterus and ovaries are unremarkable. The bladder is normal. Review of bone windows demonstrates no aggressive osseous lesions. IMPRESSION: 1. Worsening of high-grade partial small bowel obstruction with a segment of tight stricturing in the mid small bowel with significant dilatation proximal to the stricture as well as enteric stasis. 2. Large hiatal hernia which contains stomach and a loop of small bowel. 3. Potential areas of stricturing within the more distal small bowel in the left abdomen without evidence of significant obstruction. Original Report Authenticated By: Genevive Bi, M.D.  Dg Abd Acute W/chest  03/25/2012 *RADIOLOGY REPORT* Clinical Data: Vomiting, diarrhea and body aches. ACUTE ABDOMEN SERIES (ABDOMEN 2 VIEW & CHEST 1 VIEW) Comparison: Chest and abdominal radiographs performed 12/15/2011 Findings: The lungs are well-aerated. Minimal bilateral airspace opacities could reflect mild pneumonia. There is no evidence of pleural effusion or pneumothorax. The cardiomediastinal silhouette is within normal limits. A relatively large hiatal hernia is noted, with an air-fluid level. A nonspecific bowel gas pattern is noted, with segments of small bowel distension, measuring up to 4.5 cm in diameter, and associated air-fluid levels. However, air is also seen within the colon. The pattern suggests against bowel obstruction. No free intra-abdominal air is identified on the provided upright view. No acute osseous abnormalities are seen;  the sacroiliac joints are unremarkable in appearance. Scattered postoperative change is noted along the right side of the abdomen. IMPRESSION: 1. Nonspecific bowel gas pattern, with segments of small bowel distension, measuring up to 4.5 cm in diameter, and associated air- fluid levels. However, air also seen within the colon. The pattern suggests against bowel obstruction. No free intra- abdominal air seen. 2. Relatively large hiatal hernia noted. 3. Minimal bilateral airspace opacities could reflect mild pneumonia. Original Report Authenticated By: Tonia Ghent, M.D.  MEDICATIONS:  Scheduled Meds:   .  ciprofloxacin  400 mg  Intravenous  Q12H   .  dicyclomine  10 mg  Oral  TID AC & HS   .  heparin  5,000 Units  Subcutaneous  Q8H   .  metronidazole  500 mg  Intravenous  Q8H   .  nystatin-triamcinolone   Topical  BID   .  pantoprazole  40 mg  Oral  Q1200   .  predniSONE  40 mg  Oral  Q breakfast   .  DISCONTD: pantoprazole (PROTONIX) IV  40 mg  Intravenous  Q24H    Continuous Infusions:   .  sodium chloride 0.9 % 1,000 mL with potassium chloride 40 mEq infusion  50 mL/hr at 03/30/12 1756    PRN Meds:.acetaminophen, HYDROmorphone (DILAUDID) injection, promethazine, zolpidem  Antibiotics:  Anti-infectives     Start    Dose/Rate  Route  Frequency  Ordered  Stop     03/29/12 1400    ciprofloxacin (CIPRO) IVPB 400 mg  400 mg  200 mL/hr over 60 Minutes  Intravenous  Every 12 hours  03/29/12 1312                  03/27/12 1100    fluconazole (DIFLUCAN) tablet 100 mg  100 mg  Oral  Once  03/27/12 1014  03/27/12 1109                 03/26/12 1300    metroNIDAZOLE (FLAGYL) IVPB 500 mg  500 mg  100 mL/hr over 60 Minutes  Intravenous  Every 8 hours  03/26/12 1146                  03/25/12 0615    cefTRIAXone (ROCEPHIN) 1 g in dextrose 5 % 50 mL IVPB  1 g  100 mL/hr over 30 Minutes  Intravenous  Once  03/25/12 0613  03/25/12 0703                 03/25/12 0615    azithromycin (ZITHROMAX) 500 mg in  dextrose 5 % 250 mL IVPB Status: Discontinued   500 mg  250 mL/hr over 60 Minutes  Intravenous  Once  03/25/12 4098  03/25/12 0900                      Assessment/Plan:  Patient Active Hospital Problem List: 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 Will hold Remicade at this point per Dr. Bosie Clos, may worsen stricturing.   Chronic normocytic anemia  likely chronic disease.  Hypotension  BP soft, but patient asymptomatic, monitor for now  GERD  Added back PPI IV - will progress to PO as diet advances.   Hypokalemia  Likely due to diarrhea.  Monitor lytes periodically   Disposition:  D/C home today   DVT Prophylaxis:  Prophylactic heparin  Code Status:  Full Code  Maretta Bees, MD.  03/31/2012, 2:11 PM

## 2012-03-31 NOTE — Evaluation (Signed)
Occupational Therapy Evaluation Patient Details Name: Danielle [name] MRN: [id number] DOB: January 24, 1946 Today's Date: 03/31/2012 Time: 4098-1191 OT Time Calculation (min): 35 min  OT Assessment / Plan / Recommendation Clinical Impression  Pt s/p admit for chrons issues vs cdiff with mild deficits in areas of activity tolerance and strength simply from hospitalization.  Pt would benefit from cont OT to increase I with basic adls to mod I level so she can d/c home with family.    OT Assessment  Patient needs continued OT Services    Follow Up Recommendations  No OT follow up    Equipment Recommendations  None recommended by OT    Frequency Min 2X/week    Precautions / Restrictions Precautions Precautions: Fall Restrictions Weight Bearing Restrictions: No   Pertinent Vitals/Pain Pt reports 8/10 pain.  Nursing aware.    ADL  Eating/Feeding: Performed;Independent Where Assessed - Eating/Feeding: Edge of bed Grooming: Performed;Wash/dry hands;Teeth care;Supervision/safety Where Assessed - Grooming: Standing at sink Upper Body Bathing: Performed;Set up Where Assessed - Upper Body Bathing: Sitting, bed Lower Body Bathing: Minimal assistance;Other (comment) (min guard to stand.) Where Assessed - Lower Body Bathing: Sit to stand from bed Upper Body Dressing: Performed;Set up Where Assessed - Upper Body Dressing: Sitting, chair Lower Body Dressing: Performed;Minimal assistance;Other (comment) (min guard to stand.) Where Assessed - Lower Body Dressing: Sit to stand from bed Toilet Transfer: Performed;Supervision/safety Toilet Transfer Method: Stand pivot Toilet Transfer Equipment: Bedside commode Toileting - Clothing Manipulation: Performed;Supervision/safety Where Assessed - Toileting Clothing Manipulation: Sit to stand from 3-in-1 or toilet Toileting - Hygiene: Performed;Supervision/safety;Other (comment) (extra time needed.) Where Assessed - Toileting Hygiene: Sit on 3-in-1 or  toilet Tub/Shower Transfer: Not assessed Tub/Shower Transfer Method: Not assessed Ambulation Related to ADLs: Pt walked in room with S and I V pole ADL Comments: Pt overall needs very little assist for adls.  Pt weak from hospitalization but doing well.    OT Goals Acute Rehab OT Goals OT Goal Formulation: With patient Time For Goal Achievement: 04/07/12 Potential to Achieve Goals: Good ADL Goals Pt Will Perform Grooming: Independently;Standing at sink ADL Goal: Grooming - Progress: Goal set today Pt Will Perform Lower Body Bathing: with modified independence;Sitting in shower ADL Goal: Lower Body Bathing - Progress: Goal set today Pt Will Perform Lower Body Dressing: with modified independence;Sit to stand from chair;Other (comment) (and gathering clothes w AD if needed.) ADL Goal: Lower Body Dressing - Progress: Goal set today Pt Will Perform Tub/Shower Transfer: Shower transfer;with supervision;Shower seat with back;Grab bars ADL Goal: Web designer - Progress: Goal set today Additional ADL Goal #1: Pt will complete all aspects of toileting on comfort height commode with rails and mod I. ADL Goal: Additional Goal #1 - Progress: Goal set today  Visit Information  Last OT Received On: 03/31/12 Assistance Needed: +1    Subjective Data  Subjective: I can do most for myself. Patient Stated Goal: To not have surgery.   Prior Functioning  Home Living Lives With: Family Available Help at Discharge: Family;Available 24 hours/day Type of Home: House Home Access: Stairs to enter [employer] of Steps: 4 Entrance Stairs-Rails: Right Home Layout: One level Bathroom Shower/Tub: Walk-in shower;Door Bathroom Toilet: Handicapped height Home Adaptive Equipment: Built-in shower seat;Grab bars around toilet;Grab bars in shower Prior Function Level of Independence: Independent Able to Take Stairs?: Yes Driving: Yes Vocation: Retired Musician: No  difficulties Dominant Hand: Right    Cognition  Overall Cognitive Status: Appears within functional limits for tasks assessed/performed Arousal/Alertness: Awake/alert  Orientation Level: Appears intact for tasks assessed;Oriented X4 / Intact Behavior During Session: [facility] for tasks performed    Extremity/Trunk Assessment Right Upper Extremity Assessment RUE ROM/Strength/Tone: Within functional levels RUE Sensation: WFL - Light Touch RUE Coordination: WFL - gross/fine motor Left Upper Extremity Assessment LUE ROM/Strength/Tone: Within functional levels LUE Sensation: WFL - Light Touch LUE Coordination: WFL - gross/fine motor   Mobility Bed Mobility Bed Mobility: Supine to Sit;Sitting - Scoot to Edge of Bed Supine to Sit: 5: Supervision;With rails Sitting - Scoot to Edge of Bed: 5: Supervision;With rail Details for Bed Mobility Assistance: Pt slow but does not need hands on assist. Transfers Transfers: Sit to Stand;Stand to Sit Sit to Stand: 5: Supervision;With upper extremity assist;With armrests;From bed Stand to Sit: 5: Supervision;To chair/3-in-1;With armrests;With upper extremity assist Details for Transfer Assistance: no hands on assist needed.  close S needed.   Exercise    Balance Balance Balance Assessed: Yes Static Sitting Balance Static Sitting - Comment/# of Minutes: 10 Dynamic Sitting Balance Dynamic Sitting - Comments: Pt I w/ all sitting tasks. Static Standing Balance Static Standing - Level of Assistance: 5: Stand by assistance  End of Session OT - End of Session Activity Tolerance: Patient limited by fatigue Patient left: in bed;with call bell/phone within reach;with bed alarm set Nurse Communication: Mobility status   Hope Budds 03/31/2012, 9:44 AM [phone]

## 2012-03-31 NOTE — Progress Notes (Signed)
Patient ambulated up to nursing station and end of hallway without assistance.

## 2012-03-31 NOTE — Progress Notes (Signed)
Patient ID: Danielle [name], female   DOB: [birth year], 66 y.o.   MRN: [id number]    Subjective: Pt is feeling great.  Had 3 BMs yesterday.  Tolerating a soft diet.  Objective: Vital signs in last 24 hours: Temp:  [97.7 F (36.5 C)-99.3 F (37.4 C)] 97.7 F (36.5 C) (04/25 0609) Pulse Rate:  [76-112] 76  (04/25 0609) Resp:  [18-24] 18  (04/25 0609) BP: (86-95)/(56-61) 86/56 mmHg (04/25 0609) SpO2:  [96 %-100 %] 100 % (04/25 0609) Last BM Date: 03/30/12  Intake/Output from previous day: 04/24 0701 - 04/25 0700 In: 3196.7 [P.O.:600; I.V.:2196.7; IV Piggyback:400] Out: 2222 [Urine:2220; Stool:2] Intake/Output this shift:    PE: Abd: soft, NT, ND, +BS Heart: regular Lungs: CTAB  Lab Results:   Basename 03/31/12 0548 03/30/12 0610  WBC 10.6* 15.0*  HGB 9.0* 9.5*  HCT 27.3* 29.1*  PLT 308 329   BMET  Basename 03/31/12 0548 03/30/12 0610  NA 135 135  K 3.8 3.7  CL 115* 114*  CO2 11* 11*  GLUCOSE 133* 83  BUN 7 5*  CREATININE 1.02 1.13*  CALCIUM 8.4 8.5   PT/INR No results found for this basename: LABPROT:2,INR:2 in the last 72 hours CMP     Component Value Date/Time   NA 135 03/31/2012 0548   K 3.8 03/31/2012 0548   CL 115* 03/31/2012 0548   CO2 11* 03/31/2012 0548   GLUCOSE 133* 03/31/2012 0548   BUN 7 03/31/2012 0548   CREATININE 1.02 03/31/2012 0548   CREATININE 0.99 03/22/2012 1151   CALCIUM 8.4 03/31/2012 0548   PROT 7.7 12/15/2011 1818   ALBUMIN 3.1* 12/15/2011 1818   AST 35 12/15/2011 1818   ALT 42* 12/15/2011 1818   ALKPHOS 107 12/15/2011 1818   BILITOT 0.3 12/15/2011 1818   GFRNONAA 57* 03/31/2012 0548   GFRAA 66* 03/31/2012 0548   Lipase     Component Value Date/Time   LIPASE 33 03/25/2012 1110       Studies/Results: No results found.  Anti-infectives: Anti-infectives     Start     Dose/Rate Route Frequency Ordered Stop   03/29/12 1400   ciprofloxacin (CIPRO) IVPB 400 mg        400 mg 200 mL/hr over 60 Minutes Intravenous Every 12 hours 03/29/12 1312      03/27/12 1100   fluconazole (DIFLUCAN) tablet 100 mg        100 mg Oral  Once 03/27/12 1014 03/27/12 1109   03/26/12 1300   metroNIDAZOLE (FLAGYL) IVPB 500 mg        500 mg 100 mL/hr over 60 Minutes Intravenous Every 8 hours 03/26/12 1146     03/25/12 0615   cefTRIAXone (ROCEPHIN) 1 g in dextrose 5 % 50 mL IVPB        1 g 100 mL/hr over 30 Minutes Intravenous  Once 03/25/12 0613 03/25/12 0703   03/25/12 0615   azithromycin (ZITHROMAX) 500 mg in dextrose 5 % 250 mL IVPB  Status:  Discontinued        500 mg 250 mL/hr over 60 Minutes Intravenous  Once 03/25/12 0613 03/25/12 0900           Assessment/Plan  1. PSBO secondary to stricture 2. Crohn's disease  Plan: 1. Patient seems to be improving with conservative management.  Continue for now.  Patient would like to avoid an operation if able.  Will continue to follow and see how she does.  If she goes backwards, then would consider surgery  tomorrow.   LOS: 6 days    Eliette Drumwright E 03/31/2012

## 2012-03-31 NOTE — Progress Notes (Signed)
The patient is stable.  There is no evidence of peritonitis, acute abdomen, nor shock.  There is no strong evidence of failure of improvement nor decline with current non-operative management.  While I worry than need for surgery at some point high, there is no need for surgery at the present moment.  We will continue to follow.

## 2012-03-31 NOTE — Progress Notes (Signed)
PATIENT DETAILS Name: Danielle [name] Age: 66 y.o. Sex: female Date of Birth: Aug 14, 1946 Admit Date: 03/25/2012 ZOX:[id number] K, MD, MD  Subjective: Doing well, tolerating advancement of diet so far   Objective: Vital signs in last 24 hours: Filed Vitals:   03/30/12 2315 03/31/12 0609 03/31/12 1013 03/31/12 1252  BP: 89/57 86/56 92/64  102/72  Pulse: 85 76 100 84  Temp: 97.9 F (36.6 C) 97.7 F (36.5 C) 97.6 F (36.4 C) 97.5 F (36.4 C)  TempSrc: Oral Oral Oral Oral  Resp: 18 18 20 22   Height:      Weight:      SpO2: 98% 100% 99% 100%    Weight change:   Body mass index is 26.09 kg/(m^2).  Intake/Output from previous day:  Intake/Output Summary (Last 24 hours) at 03/31/12 1411 Last data filed at 03/31/12 0955  Gross per 24 hour  Intake 3316.67 ml  Output   1151 ml  Net 2165.67 ml    PHYSICAL EXAM: Gen Exam: Awake and alert with clear speech.   Neck: Supple, No JVD.   Chest: B/L Clear.   CVS: S1 S2 Regular, no murmurs.  Abdomen: soft, BS +, non tender, non distended.  Extremities: no edema, lower extremities warm to touch. Neurologic: Non Focal.   Skin: No Rash.   Wounds: N/A.    CONSULTS:  GI and general surgery  LAB RESULTS: CBC  Lab 03/31/12 0548 03/30/12 0610 03/27/12 0637 03/26/12 0545 03/25/12 1110 03/25/12 0415  WBC 10.6* 15.0* 12.2* 19.6* 16.5* --  HGB 9.0* 9.5* 10.2* 11.4* 10.8* --  HCT 27.3* 29.1* 32.3* 35.8* 33.0* --  PLT 308 329 406* 491* 440* --  MCV 83.2 84.6 87.3 86.5 85.9 --  MCH 27.4 27.6 27.6 27.5 28.1 --  MCHC 33.0 32.6 31.6 31.8 32.7 --  RDW 14.4 14.5 14.2 14.3 14.2 --  LYMPHSABS -- -- 2.0 -- -- 2.5  MONOABS -- -- 1.3* -- -- 1.2*  EOSABS -- -- 0.1 -- -- 0.2  BASOSABS -- -- 0.0 -- -- 0.0  BANDABS -- -- -- -- -- --    Chemistries   Lab 03/31/12 0548 03/30/12 0610 03/29/12 0557 03/27/12 0637 03/26/12 0545  NA 135 135 139 138 131*  K 3.8 3.7 2.9* 3.6 3.7  CL 115* 114* 114* 112 105  CO2 11* 11* 12* 10* 11*  GLUCOSE  133* 83 112* 85 67*  BUN 7 5* 7 13 12   CREATININE 1.02 1.13* 1.19* 1.19* 1.21*  CALCIUM 8.4 8.5 9.1 8.9 9.2  MG -- -- -- -- --    GFR Estimated Creatinine Clearance: 53.1 ml/min (by C-G formula based on Cr of 1.02).  Coagulation profile  Lab 03/26/12 0545  INR 1.15  PROTIME --    Cardiac Enzymes No results found for this basename: CK:3,CKMB:3,TROPONINI:3,MYOGLOBIN:3 in the last 168 hours  No components found with this basename: POCBNP:3 No results found for this basename: DDIMER:2 in the last 72 hours No results found for this basename: HGBA1C:2 in the last 72 hours No results found for this basename: CHOL:2,HDL:2,LDLCALC:2,TRIG:2,CHOLHDL:2,LDLDIRECT:2 in the last 72 hours No results found for this basename: TSH,T4TOTAL,FREET3,T3FREE,THYROIDAB in the last 72 hours No results found for this basename: VITAMINB12:2,FOLATE:2,FERRITIN:2,TIBC:2,IRON:2,RETICCTPCT:2 in the last 72 hours No results found for this basename: LIPASE:2,AMYLASE:2 in the last 72 hours  Urine Studies No results found for this basename: UACOL:2,UAPR:2,USPG:2,UPH:2,UTP:2,UGL:2,UKET:2,UBIL:2,UHGB:2,UNIT:2,UROB:2,ULEU:2,UEPI:2,UWBC:2,URBC:2,UBAC:2,CAST:2,CRYS:2,UCOM:2,BILUA:2 in the last 72 hours  MICROBIOLOGY: Recent Results (from the past 240 hour(s))  URINE CULTURE     Status: Normal  Collection Time   03/25/12  6:06 AM      Component Value Range Status Comment   Specimen Description URINE, CLEAN CATCH   Final    Special Requests NONE   Final    Culture  Setup Time [id number]   Final    Colony Count >=100,000 COLONIES/ML   Final    Culture     Final    Value: Multiple bacterial morphotypes present, none predominant. Suggest appropriate recollection if clinically indicated.   Report Status 03/26/2012 FINAL   Final   STOOL CULTURE     Status: Normal   Collection Time   03/25/12 11:00 AM      Component Value Range Status Comment   Specimen Description STOOL   Final    Special Requests NONE   Final     Culture     Final    Value: NO SALMONELLA, SHIGELLA, CAMPYLOBACTER, OR YERSINIA ISOLATED   Report Status 03/29/2012 FINAL   Final   CLOSTRIDIUM DIFFICILE BY PCR     Status: Normal   Collection Time   03/25/12 11:00 AM      Component Value Range Status Comment   C difficile by pcr NEGATIVE  NEGATIVE  Final   URINE CULTURE     Status: Normal   Collection Time   03/30/12 11:17 AM      Component Value Range Status Comment   Specimen Description URINE, CLEAN CATCH   Final    Special Requests NONE   Final    Culture  Setup Time [id number]   Final    Colony Count NO GROWTH   Final    Culture NO GROWTH   Final    Report Status 03/31/2012 FINAL   Final     RADIOLOGY STUDIES/RESULTS: Ct Entero Abd/pelvis W/cm  03/28/2012  *RADIOLOGY REPORT*  Clinical Data:  Crohn's disease, abdominal pain, concern for active Crohn's disease.  CT ABDOMEN AND PELVIS WITH CONTRAST (CT ENTEROGRAPHY)  Technique:  Multidetector CT of the abdomen and pelvis during bolus administration of intravenous contrast. Negative oral contrast VoLumen was given.  Contrast: OMNIPAQUE IOHEXOL 300 MG/ML  SOLN  Comparison:  CT scan 12/20/2011  Findings:  Gastrointestinal:  There is  a large hiatal hernia present.  This hernia sac appears to contain not only the stomach but a loop of small bowel.  Entirety of the hernia not is not imaged but the gas esophageal junction appears to be above the diaphragm.  Duodenum appears normal. The small bowel is dilated significantly up to 7 cm.  This is increased from 5 cm on comparison exam.  There is dilatation is secondary to a stricture within the midabdomen over approximately 2 cm segment of small bowel (image 29, series 8 and image 79, series 7).  Proximal to the stricture there is stasis of enteric contents as well as dilatation of the small bowel.  There is second region of potential narrowing involving the more distal loops of small bowel in the left abdomen (image 46, series 2).  No  significant obstruction at this level.  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  The distal small bowel/colon appear normal.  No evidence of pneumatosis or intraperitoneal free air.  Lung bases are clear.  No pericardial fluid.  No focal hepatic lesion.  The gallbladder is absent.  The pancreas, spleen, adrenal glands, kidneys are normal.  Abdominal aorta normal caliber.  No retroperitoneal periportal lymphadenopathy.  No free fluid the pelvis.  The uterus and ovaries  are unremarkable. The bladder is normal. Review of  bone windows demonstrates no aggressive osseous lesions.  IMPRESSION:  1.  Worsening of high-grade partial small bowel obstruction with a segment of tight stricturing in the mid small bowel with significant dilatation proximal to the stricture as well as enteric stasis. 2.  Large hiatal hernia which contains stomach and a loop of small bowel. 3.  Potential areas of stricturing within the more distal small bowel in the left abdomen without evidence of significant obstruction.  Original Report Authenticated By: Genevive Bi, M.D.   Dg Abd Acute W/chest  03/25/2012  *RADIOLOGY REPORT*  Clinical Data: Vomiting, diarrhea and body aches.  ACUTE ABDOMEN SERIES (ABDOMEN 2 VIEW & CHEST 1 VIEW)  Comparison: Chest and abdominal radiographs performed 12/15/2011  Findings: The lungs are well-aerated.  Minimal bilateral airspace opacities could reflect mild pneumonia.  There is no evidence of pleural effusion or pneumothorax.  The cardiomediastinal silhouette is within normal limits.  A relatively large hiatal hernia is noted, with an air-fluid level. A nonspecific bowel gas pattern is noted, with segments of small bowel distension, measuring up to 4.5 cm in diameter, and associated air-fluid levels.  However, air is also seen within the colon.  The pattern suggests against bowel obstruction.  No free intra-abdominal air is identified on the provided upright view.  No acute osseous abnormalities are seen;  the sacroiliac joints are unremarkable in appearance.  Scattered postoperative change is noted along the right side of the abdomen.  IMPRESSION:  1.  Nonspecific bowel gas pattern, with segments of small bowel distension, measuring up to 4.5 cm in diameter, and associated air- fluid levels.  However, air also seen within the colon.  The pattern suggests against bowel obstruction.  No free intra- abdominal air seen. 2.  Relatively large hiatal hernia noted. 3.  Minimal bilateral airspace opacities could reflect mild pneumonia.  Original Report Authenticated By: Tonia Ghent, M.D.    MEDICATIONS: Scheduled Meds:    . ciprofloxacin  400 mg Intravenous Q12H  . dicyclomine  10 mg Oral TID AC & HS  . heparin  5,000 Units Subcutaneous Q8H  . metronidazole  500 mg Intravenous Q8H  . nystatin-triamcinolone   Topical BID  . pantoprazole  40 mg Oral Q1200  . predniSONE  40 mg Oral Q breakfast  . DISCONTD: pantoprazole (PROTONIX) IV  40 mg Intravenous Q24H   Continuous Infusions:    . sodium chloride 0.9 % 1,000 mL with potassium chloride 40 mEq infusion 50 mL/hr at 03/30/12 1756   PRN Meds:.acetaminophen, HYDROmorphone (DILAUDID) injection, promethazine, zolpidem  Antibiotics: Anti-infectives     Start     Dose/Rate Route Frequency Ordered Stop   03/29/12 1400   ciprofloxacin (CIPRO) IVPB 400 mg        400 mg 200 mL/hr over 60 Minutes Intravenous Every 12 hours 03/29/12 1312     03/27/12 1100   fluconazole (DIFLUCAN) tablet 100 mg        100 mg Oral  Once 03/27/12 1014 03/27/12 1109   03/26/12 1300   metroNIDAZOLE (FLAGYL) IVPB 500 mg        500 mg 100 mL/hr over 60 Minutes Intravenous Every 8 hours 03/26/12 1146     03/25/12 0615   cefTRIAXone (ROCEPHIN) 1 g in dextrose 5 % 50 mL IVPB        1 g 100 mL/hr over 30 Minutes Intravenous  Once 03/25/12 0613 03/25/12 0703   03/25/12 0615   azithromycin (ZITHROMAX) 500  mg in dextrose 5 % 250 mL IVPB  Status:  Discontinued        500  mg 250 mL/hr over 60 Minutes Intravenous  Once 03/25/12 4098 03/25/12 0900          Assessment/Plan: Patient Active Hospital Problem List: 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 -current plans are to monitor clinical course-advancing diet to low fiber-and then if tolerates-to d/c home in am  Fever -this was on 4/23 -continues to be afebrile since -monitor for now-repeat UA-not consistent with UTI -monitor-already on Cipro and Flagyl  Crohn's disease  patient is on Remicade and takes every 6 weeks. Will hold Remicade at this point per Dr. Bosie Clos, may worsen stricturing.   Chronic normocytic anemia  likely chronic disease.  Hypotension  BP soft, but patient asymptomatic, monitor for now  GERD  Added back PPI IV - will progress to PO as diet advances.   Hypokalemia  Likely due to diarrhea.  Monitor lytes periodically  Disposition: Remain inpatient-likely d/c home 4/26 if tolerates a low fiber diet  DVT Prophylaxis: Prophylactic heparin  Code Status: Full Code  Maretta Bees,  MD. 03/31/2012, 2:11 PM

## 2012-03-31 NOTE — Progress Notes (Signed)
Patient ID: Danielle [name], female   DOB: [birth year], 66 y.o.   MRN: [id number] [facility] Gastroenterology Progress Note  Danielle [name] 66 y.o. [birth year]   Subjective: Feels better. Reports some form to stools and no more diarrhea. Tolerating soft foods. No N/V.  Objective: Vital signs: Filed Vitals:   03/31/12 1013  BP: 92/64  Pulse: 100  Temp: 97.6 F (36.4 C)  Resp: 20    Intake/Output last 24 hrs:  Intake/Output Summary (Last 24 hours) at 03/31/12 1237 Last data filed at 03/31/12 0955  Gross per 24 hour  Intake 3316.67 ml  Output   1151 ml  Net 2165.67 ml    Physical Exam: Gen: alert, no acute distress  Abd: soft, NT, ND, active bowel sounds  Lab Results:  Basename 03/31/12 0548 03/30/12 0610  NA 135 135  K 3.8 3.7  CL 115* 114*  CO2 11* 11*  GLUCOSE 133* 83  BUN 7 5*  CREATININE 1.02 1.13*  CALCIUM 8.4 8.5  MG -- --  PHOS -- --   No results found for this basename: AST:2,ALT:2,ALKPHOS:2,BILITOT:2,PROT:2,ALBUMIN:2 in the last 72 hours  Basename 03/31/12 0548 03/30/12 0610  WBC 10.6* 15.0*  NEUTROABS -- --  HGB 9.0* 9.5*  HCT 27.3* 29.1*  MCV 83.2 84.6  PLT 308 329     Medications: I have reviewed the patient's current medications.  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 Advance diet to low fiber diet and if tolerates that then ok to go home tomorrow on Prednisone 40mg /day until f/u with Dr. Madilyn Fireman or [facility] (defer to pt which GI doc she wants to see). D/C Cipro/Flagyl at discharge tomorrow. Will change PPI to PO. Needs to see her GI doc in 2 weeks.   Danielle [name] C. 03/31/2012, 12:37 PM

## 2012-03-31 NOTE — Progress Notes (Signed)
   CARE MANAGEMENT NOTE 03/31/2012  Patient:  Danielle [name], Danielle [name]   Account Number:  [id number]  Date Initiated:  03/28/2012  Documentation initiated by:  Letha Cape  Subjective/Objective Assessment:   dx diarrhea, uti  admit- lives with sister.     Action/Plan:   ct scan for inflammation  pt eval   Anticipated DC Date:  04/01/2012   Anticipated DC Plan:  HOME W HOME HEALTH SERVICES      DC Planning Services  CM consult      Choice offered to / List presented to:             Status of service:  In process, will continue to follow Medicare Important Message given?   (If response is "NO", the following Medicare IM given date fields will be blank) Date Medicare IM given:   Date Additional Medicare IM given:    Discharge Disposition:    Per UR Regulation:    If discussed at Long Length of Stay Meetings, dates discussed:    Comments:  03/31/12 16:52 Letha Cape RN, BSN [phone] patient does not want surgery, waiting for GI recs  see patient and for surgery recs.  03/28/12 11:07 Letha Cape RN, BSN [phone] patient lives with sister, await pt eval.  Patient has medication coverage and transportation.  NCM will continue to follow for dc needs.

## 2012-04-01 MED ORDER — HYDROCODONE-ACETAMINOPHEN 7.5-500 MG PO TABS: 1.0000 | ORAL_TABLET | Freq: Three times a day (TID) | ORAL | Status: DC | PRN

## 2012-04-01 MED ORDER — INFLIXIMAB 100 MG IV SOLR: 100.0000 mg | INTRAVENOUS | Status: DC

## 2012-04-01 MED ORDER — PROMETHAZINE HCL 25 MG PO TABS: 25.0000 mg | ORAL_TABLET | Freq: Four times a day (QID) | ORAL | Status: DC | PRN

## 2012-04-01 MED ORDER — PREDNISONE 20 MG PO TABS: 40.0000 mg | ORAL_TABLET | Freq: Every day | ORAL | Status: DC

## 2012-04-01 MED ORDER — CALCIUM-VITAMIN D 600-200 MG-UNIT PO TABS: 1.0000 | ORAL_TABLET | Freq: Every day | ORAL | Status: DC

## 2012-04-01 NOTE — Discharge Summary (Signed)
I have seen and examined the patient, I agree with the above assessment and plan.

## 2012-04-01 NOTE — Progress Notes (Signed)
Avoiding need for acute intervention this admit.  The patient is stable.  There is no evidence of peritonitis, acute abdomen, nor shock.  There is no strong evidence of failure of improvement nor decline with current non-operative management.  There is no need for surgery at the present moment.  We will sign off, call with questions.  F/U [facility] PRN, depending on stability of Crohn's disease with immunosuppressive regimen & desire of pt/GI doc (Dr. Rehman/[facility]).  Consider evaluation at [facility] with IBD team including Dr. Rae Halsted, surgeon whom handles complex IBD/Crohn's patients.  We should continue to f/u on the perirectal abscess - continue packing & f/u [facility] for that specific issue

## 2012-04-01 NOTE — Progress Notes (Signed)
Pt. discharge to floor,verbalized understanding of discharged instruction,medication,restriction,diet and follow up appointment.Baseline Vitals sign stable,Pt comfortable,no sign and symptom of distress. 

## 2012-04-01 NOTE — Progress Notes (Signed)
Physical Therapy Treatment/ Discharge Note Patient Details Name: Danielle [name] MRN: [id number] DOB: 03-26-1946 Today's Date: 04/01/2012 Time: 3244-0102 PT Time Calculation (min): 8 min  PT Assessment / Plan / Recommendation Comments on Treatment Session  Pt doing well with mobility and ready for dc home.    Follow Up Recommendations  No PT follow up    Equipment Recommendations  None recommended by PT    Frequency     Plan All goals met and education completed, patient dischaged from PT services    Precautions / Restrictions Precautions Precautions: None   Pertinent Vitals/Pain N/A    Mobility  Bed Mobility Supine to Sit: 6: Modified independent (Device/Increase time) Sitting - Scoot to Edge of Bed: 6: Modified independent (Device/Increase time) Sit to Supine: 6: Modified independent (Device/Increase time) Transfers Sit to Stand: 6: Modified independent (Device/Increase time);From bed;With upper extremity assist Stand to Sit: 6: Modified independent (Device/Increase time);To bed;With upper extremity assist Ambulation/Gait Ambulation/Gait Assistance: 6: Modified independent (Device/Increase time) Ambulation Distance (Feet): 225 Feet Assistive device: None Ambulation/Gait Assistance Details: pushed IV pole for convenience Gait Pattern: Decreased stride length    Exercises     PT Goals Acute Rehab PT Goals PT Goal: Sit to Stand - Progress: Met PT Goal: Stand to Sit - Progress: Met PT Goal: Ambulate - Progress: Met  Visit Information  Last PT Received On: 04/01/12 Assistance Needed: +1    Subjective Data  Subjective: Pt states she can go home today.   Cognition  Overall Cognitive Status: Appears within functional limits for tasks assessed/performed Arousal/Alertness: Awake/alert Orientation Level: Appears intact for tasks assessed Behavior During Session: [facility] for tasks performed    Balance  Static Standing Balance Static Standing - Level of Assistance: 6:  Modified independent (Device/Increase time)  End of Session PT - End of Session Activity Tolerance: Patient tolerated treatment well Patient left: in bed;with call bell/phone within reach    [facility] 04/01/2012, 2:25 PM  [facility] PT [phone]

## 2012-04-01 NOTE — Discharge Summary (Signed)
Patient ID: Danielle [name] MRN: [id number] DOB/AGE: Mar 20, 1946 66 y.o.  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 Active Problems:  Leukocytosis  Crohn's disease  SBO (small bowel obstruction)  Anemia  Nausea vomiting and diarrhea  Perirectal abscess, I&D 03/15/2012.   Medication List  As of 04/01/2012 11:19 AM   STOP taking these medications         POTASSIUM PO      spironolactone 50 MG tablet         TAKE these medications         Calcium-Vitamin D 600-200 MG-UNIT per tablet   Take 1 tablet by mouth daily. Do not resume this medication until it has been cleared with your Gastroenterologist.      dicyclomine 10 MG capsule   Commonly known as: BENTYL   Take 1 capsule (10 mg total) by mouth 3 (three) times daily.      HYDROcodone-acetaminophen 7.5-500 MG per tablet   Commonly known as: LORTAB   Take 1 tablet by mouth every 8 (eight) hours as needed. For pain.  Use very sparingly as this medication will slow bowel motility and could contribute to a bowel obstruction.      inFLIXimab 100 MG injection   Commonly known as: REMICADE   Inject 100 mg into the vein every 6 (six) weeks. Discuss with your gastroenterologist before resuming remicade.      multivitamin per tablet   Take 1 tablet by mouth daily.      nystatin-triamcinolone cream   Commonly known as: MYCOLOG II   APPLY TOPICALLY 4 TIMES A DAY      pantoprazole 40 MG tablet   Commonly known as: PROTONIX   Take 1 tablet (40 mg total) by mouth daily.      predniSONE 20 MG tablet   Commonly known as: DELTASONE   Take 2 tablets (40 mg total) by mouth daily with breakfast.      PROBIOTIC FORMULA PO   Take 1 tablet by mouth daily.      promethazine 25 MG tablet   Commonly known as: PHENERGAN   Take 1 tablet (25 mg total) by mouth  every 6 (six) hours as needed. Nausea.  Use sparingly as this will slow bowel motility and could contribute to bowel obstruction.      VITAMIN B-12 IJ   Inject as directed every 30 (thirty) days.      zolpidem 10 MG tablet   Commonly known as: AMBIEN   Take 1 tablet (10 mg total) by mouth as needed. insomnia            Consults: [facility], [facility]  Brief H and P: Danielle [name] is a pleasant 65 year old female with a history of Crohn's disease and recurrent small bowel obstruction, she is on Remicade therapy. She was admitted on April 19 with diarrhea and vomiting.  She was evaluated by gastroenterology who ordered a CT enterography and found worsening of a high-grade partial small bowel obstruction with a segment of tight stricturing in the mid small bowel.  Surgical consultation was called.  The patient was seen and evaluated by Dr. Michaell Cowing. He conferred with gastroenterology and they decided to try conservative management with steroids, cipro and flagyl for several days in order to potentially avoid surgery.  Fortunately with this therapy the patient has  improved and is able to tolerate low-residue, frequent, small meals. She is no longer in pain. She is no longer vomiting. Bowel movements have returned to normal, which for Danielle [name] is 4-5 bowel movements a day that are semisolid.  The patient has a perirectal abscess which was incised and drained on April 9 by Dr. Derrell Lolling.  This area was examined by [facility] surgery during her hospitalization. They recommended sitz bath daily. The patient refused dressing changes frequently. She is to continue her sitz baths at home and followup with Dr. Derrell Lolling as an outpatient.  Danielle [name] has chronic normocytic anemia which remained stable during this admission.  Her blood pressure remained soft thru out this admission.  She has not been receiving her diuretics or any blood pressure medication. At this point we will  leave her off of her spironolactone and request that her gastroenterologist check her blood pressure and her diuretic medications on Monday.  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  Her potassium at this point is stable without supplementation so we will leave her off of her outpatient potassium and again requests that her gastroenterologist evaluate this on Monday.  [name]  Physical Exam on Discharge: General: Alert, awake, oriented x3, in no acute distress. HEENT: No bruits, no goiter. Heart: Regular rate and rhythm, without murmurs, rubs, gallops. Lungs: Clear to auscultation bilaterally. Abdomen: Soft, nontender, nondistended, positive bowel sounds. Extremities: No clubbing cyanosis or edema with positive pedal pulses. Neuro: Grossly intact, nonfocal.  Filed Vitals:   03/31/12 1013 03/31/12 1252 03/31/12 2029 04/01/12 0544  BP: 92/64 102/72 96/60 86/52   Pulse: 100 84 85 95  Temp: 97.6 F (36.4 C) 97.5 F (36.4 C) 97.3 F (36.3 C) 97.7 F (36.5 C)  TempSrc: Oral Oral  Oral  Resp: 20 22 18 17   Height:      Weight:      SpO2: 99% 100% 100% 100%     Intake/Output Summary (Last 24 hours) at 04/01/12 1119 Last data filed at 04/01/12 0149  Gross per 24 hour  Intake   1370 ml  Output    900 ml  Net    470 ml    Basic Metabolic Panel:  Lab 03/31/12 1610 03/30/12 0610  NA 135 135  K 3.8 3.7  CL 115* 114*  CO2 11* 11*  GLUCOSE 133* 83  BUN 7 5*  CREATININE 1.02 1.13*  CALCIUM 8.4 8.5  MG -- --  PHOS -- --   CBC:  Lab 03/31/12 0548 03/30/12 0610 03/27/12 0637  WBC 10.6* 15.0* --  NEUTROABS -- -- 8.8*  HGB 9.0* 9.5* --  HCT 27.3* 29.1* --  MCV 83.2 84.6 --  PLT 308 329 --   CBG:  Lab 03/25/12 1236  GLUCAP 71   Coagulation:  Lab 03/26/12 0545  LABPROT 14.9  INR 1.15   Urine Drug Screen: Drugs of Abuse     Component Value Date/Time   LABOPIA NEGATIVE 02/12/2009 1034   COCAINSCRNUR NEGATIVE 02/12/2009 1034   LABBENZ  NEGATIVE 02/12/2009 1034   AMPHETMU NEGATIVE 02/12/2009 1034      Significant Diagnostic Studies:  Ct Entero Abd/pelvis W/cm  03/28/2012  *RADIOLOGY REPORT*  Clinical Data:  Crohn's disease, abdominal pain, concern for active Crohn's disease.  CT ABDOMEN AND PELVIS WITH CONTRAST (CT ENTEROGRAPHY)    IMPRESSION:  1.  Worsening of high-grade partial small bowel obstruction with a segment of tight stricturing in the mid small bowel with  significant dilatation proximal to the stricture as well as enteric stasis. 2.  Large hiatal hernia which contains stomach and a loop of small bowel. 3.  Potential areas of stricturing within the more distal small bowel in the left abdomen without evidence of significant obstruction.  Original Report Authenticated By: Genevive Bi, M.D.   Dg Abd Acute W/chest  03/25/2012  *RADIOLOGY REPORT*  Clinical Data: Vomiting, diarrhea and body aches.  ACUTE ABDOMEN SERIES (ABDOMEN 2 VIEW & CHEST 1 VIEW)    IMPRESSION:  1.  Nonspecific bowel gas pattern, with segments of small bowel distension, measuring up to 4.5 cm in diameter, and associated air- fluid levels.  However, air also seen within the colon.  The pattern suggests against bowel obstruction.  No free intra- abdominal air seen. 2.  Relatively large hiatal hernia noted. 3.  Minimal bilateral airspace opacities could reflect mild pneumonia.  Original Report Authenticated By: Tonia Ghent, M.D.    Disposition and Follow-up: Stable for discharge to home with close follow up as listed below.    Instructions for wound care of Peri-Rectal abscess were provided by Surgery and listed on the after visit summary.  Discharge Orders    Future Appointments: Provider: Department: Dept Phone: Center:   04/18/2012 8:45 AM Ernestene Mention, MD [facility] [phone] None     Future Orders Please Complete By Expires   Diet general      Comments:   Eat small, low fiber, low residue meals.  If you develop pain stop eating solids  and start drinking increased liquids.  If you develop severe pain or vomiting call your gastroenterologist or return to the emergency department.   Increase activity slowly        Follow-up Information    Follow up with Ernestene Mention, MD in 2 weeks. (to follow up on perirectal abscess)    Contact information:   [facility] [address], [address] [place] [address] [phone]       Follow up with Malissa Hippo, MD. (Monday 4/29 as previously scheduled.)    Contact information:   [address], [address] [place] [address] [phone]       Follow up with [facility], MD in 3 weeks.   Contact information:   [address] [place] [address] [phone]         Note to Dr. Karilyn Cota:  Ms. Sosa has been instructed not to take her Remicade, Calcium, Spironolactone, or Potassium until she sees you on Monday.  Please check a cbc, bmet, and her blood pressure and dose her medications accordingly.  Time spent on Discharge: 40 min.  SignedStephani Police 04/01/2012, 11:19 AM [phone]

## 2012-04-01 NOTE — Progress Notes (Signed)
Patient ID: Danielle [name], female   DOB: 03-17-1946, 66 y.o.   MRN: [id number] Small amount of nausea and abdominal pain. Denies diarrhea or vomiting. Getting ready to eat a low fiber breakfast.   Pt reports she plans to see Dr. Samuel Jester from GI next Monday as scheduled. Needs to go home on Prednisone 40mg /day. See yesterday note for additional details. Ok to go home today from GI standpoint on low fiber diet.

## 2012-04-01 NOTE — Progress Notes (Signed)
Patient ID: Danielle [name], female   DOB: February 17, 1946, 66 y.o.   MRN: [id number]    Subjective: Pt feels great.  No obstructive symptoms.  She is tolerating a regular diet and having more formed BMs.  Objective: Vital signs in last 24 hours: Temp:  [97.3 F (36.3 C)-97.7 F (36.5 C)] 97.7 F (36.5 C) (04/26 0544) Pulse Rate:  [84-100] 95  (04/26 0544) Resp:  [17-22] 17  (04/26 0544) BP: (86-102)/(52-72) 86/52 mmHg (04/26 0544) SpO2:  [99 %-100 %] 100 % (04/26 0544) Last BM Date: 03/31/12  Intake/Output from previous day: 04/25 0701 - 04/26 0700 In: 1610 [P.O.:960; I.V.:450; IV Piggyback:200] Out: 900 [Urine:900] Intake/Output this shift:    PE: Abd: soft, NT, ND, +BS  Lab Results:   [facility] 03/31/12 0548 03/30/12 0610  WBC 10.6* 15.0*  HGB 9.0* 9.5*  HCT 27.3* 29.1*  PLT 308 329   BMET  Basename 03/31/12 0548 03/30/12 0610  NA 135 135  K 3.8 3.7  CL 115* 114*  CO2 11* 11*  GLUCOSE 133* 83  BUN 7 5*  CREATININE 1.02 1.13*  CALCIUM 8.4 8.5   PT/INR No results found for this basename: LABPROT:2,INR:2 in the last 72 hours CMP     Component Value Date/Time   NA 135 03/31/2012 0548   K 3.8 03/31/2012 0548   CL 115* 03/31/2012 0548   CO2 11* 03/31/2012 0548   GLUCOSE 133* 03/31/2012 0548   BUN 7 03/31/2012 0548   CREATININE 1.02 03/31/2012 0548   CREATININE 0.99 03/22/2012 1151   CALCIUM 8.4 03/31/2012 0548   PROT 7.7 12/15/2011 1818   ALBUMIN 3.1* 12/15/2011 1818   AST 35 12/15/2011 1818   ALT 42* 12/15/2011 1818   ALKPHOS 107 12/15/2011 1818   BILITOT 0.3 12/15/2011 1818   GFRNONAA 57* 03/31/2012 0548   GFRAA 66* 03/31/2012 0548   Lipase     Component Value Date/Time   LIPASE 33 03/25/2012 1110       Studies/Results: No results found.  Anti-infectives: Anti-infectives     Start     Dose/Rate Route Frequency Ordered Stop   03/29/12 1400   ciprofloxacin (CIPRO) IVPB 400 mg        400 mg 200 mL/hr over 60 Minutes Intravenous Every 12 hours 03/29/12 1312     03/27/12 1100   fluconazole (DIFLUCAN) tablet 100 mg        100 mg Oral  Once 03/27/12 1014 03/27/12 1109   03/26/12 1300   metroNIDAZOLE (FLAGYL) IVPB 500 mg        500 mg 100 mL/hr over 60 Minutes Intravenous Every 8 hours 03/26/12 1146     03/25/12 0615   cefTRIAXone (ROCEPHIN) 1 g in dextrose 5 % 50 mL IVPB        1 g 100 mL/hr over 30 Minutes Intravenous  Once 03/25/12 0613 03/25/12 0703   03/25/12 0615   azithromycin (ZITHROMAX) 500 mg in dextrose 5 % 250 mL IVPB  Status:  Discontinued        500 mg 250 mL/hr over 60 Minutes Intravenous  Once 03/25/12 0613 03/25/12 0900           Assessment/Plan  1. SB stricture, likely secondary to crohn's disease  Plan: 1. Patient is feeling better and would prefer to defer surgery at this time.  She is aware that this may return in the future and may need surgical intervention at that point; however, for now she wants to be able to go  home.  She should follow up with her GI docs, which she has an appointment on Monday.   LOS: 7 days    Ron Junco E 04/01/2012

## 2012-04-01 NOTE — Discharge Instructions (Signed)
Peri-Rectal Abscess Your caregiver has diagnosed you as having a peri-rectal abscess. This is an infected area near the rectum that is filled with pus. If the abscess is near the surface of the skin, your caregiver may open (incise) the area and drain the pus. HOME CARE INSTRUCTIONS   If your abscess was opened up and drained. A small piece of gauze may be placed in the opening so that it can drain. Do not remove the gauze unless directed by your caregiver.   A loose dressing may be placed over the abscess site. Change the dressing as often as necessary to keep it clean and dry.   After the drain is removed, the area may be washed with a gentle antiseptic (soap) four times per day.   A warm sitz bath, warm packs or heating pad may be used for pain relief, taking care not to burn yourself.   Return for a wound check in 1 day or as directed.   An "inflatable doughnut" may be used for sitting with added comfort. These can be purchased at a drugstore or medical supply house.   To reduce pain and straining with bowel movements, eat a high fiber diet with plenty of fruits and vegetables. Use stool softeners as recommended by your caregiver. This is especially important if narcotic type pain medications were prescribed as these may cause marked constipation.   Only take over-the-counter or prescription medicines for pain, discomfort, or fever as directed by your caregiver.  SEEK IMMEDIATE MEDICAL CARE IF:   You have increasing pain that is not controlled by medication.   There is increased inflammation (redness), swelling, bleeding, or drainage from the area.   An oral temperature above 102 F (38.9 C) develops.   You develop chills or generalized malaise (feel lethargic or feel "washed out").   You develop any new symptoms (problems) you feel may be related to your present problem.  Document Released: 11/20/2000 Document Revised: 11/12/2011 Document Reviewed: 11/20/2008 [facility] Patient  Information 2012 [place], [place].  Crohn's Disease Crohn's disease is a long-term (chronic) soreness and redness (inflammation) of the intestines (bowel). It can affect any portion of the digestive tract, from the mouth to the anus. It can also cause problems outside the digestive tract. Crohn's disease is closely related to a disease called ulcerative colitis (together, these two diseases are called inflammatory bowel disease).  CAUSES  The cause of Crohn's disease is not known. One Nelva Bush is that, in an easily affected (susceptible) person, the immune system is triggered to attack the body's own digestive tissue. Crohn's disease runs in families. It seems to be more common in certain geographic areas and amongst certain races. There are no clear-cut dietary causes.  SYMPTOMS  Crohn's disease can cause many different symptoms since it can affect many different parts of the body. Symptoms include:  Fatigue.   Weight loss.   Chronic diarrhea, sometime bloody.   Abdominal pain and cramps.   Fever.   Ulcers or canker sores in the mouth or rectum.   Anemia (low red blood cells).   Arthritis, skin problems, and eye problems may occur.  Complications of Crohn's disease can include:  Series of holes (perforation) of the bowel.   Portions of the intestines sticking to each other (adhesions).   Obstruction of the bowel.   Fistula formation, typically in the rectal area but also in other areas. A fistula is an opening between the bowels and the outside, or between the bowels and another organ.  A painful crack in the mucous membrane of the anus (rectal fissure).  DIAGNOSIS  Your caregiver may suspect Crohn's disease based on your symptoms and an exam. Blood tests may confirm that there is a problem. You may be asked to submit a stool specimen for examination. X-rays and CT scans may be necessary. Ultimately, the diagnosis is usually made after a procedure that uses a flexible tube that is  inserted via your mouth or your anus. This is done under sedation and is called either an upper endoscopy or colonoscopy. With these tests, the specialist can take tiny tissue samples and remove them from the inside of the bowel (biopsy). Examination of this biopsy tissue under a microscope can reveal Crohn's disease as the cause of your symptoms. Due to the many different forms that Crohn's disease can take, symptoms may be present for several years before a diagnosis is made. HOME CARE INSTRUCTIONS   There is no cure for Crohn's disease. The best treatment is frequent checkups with your caregiver.   Symptoms such as diarrhea can be controlled with medications. Avoid foods that have a laxative effect such as fresh fruit, vegetables and dairy products. During flare ups, you can rest your bowel by refraining from solid foods. Drink clear liquids frequently during the day (electrolyte or re-hydrating fluids are best. Your caregiver can help you with suggestions). Drink often to prevent loss of body fluids (dehydration). When diarrhea has cleared, eat small meals and more frequently. Avoid food additives and stimulants such as caffeine (coffee, tea, or chocolate). Enzyme supplements may help if you develop intolerance to a sugar in dairy products (lactose). Ask your caregiver or dietitian about specific dietary instructions.   Try to maintain a positive attitude. Learn relaxation techniques such as self hypnosis, mental imaging, and muscle relaxation.   If possible, avoid stresses which can aggravate your condition.   Exercise regularly.   Follow your diet.   Always get plenty of rest.  SEEK MEDICAL CARE IF:   Your symptoms fail to improve after a week or two of new treatment.   You experience continued weight loss.   You have ongoing crampy digestion or loose bowels.   You develop a new skin rash, skin sores, or eye problems.  SEEK IMMEDIATE MEDICAL CARE IF:   You have worsening of your  symptoms or develop new symptoms.   You have a fever.   You develop bloody diarrhea.   You develop severe abdominal pain.  MAKE SURE YOU:   Understand these instructions.   Will watch your condition.   Will get help right away if you are not doing well or get worse.  Document Released: 09/02/2005 Document Revised: 11/12/2011 Document Reviewed: 08/01/2007 [facility] Patient Information 2012 [place], [place].

## 2012-04-05 ENCOUNTER — Encounter (INDEPENDENT_AMBULATORY_CARE_PROVIDER_SITE_OTHER): Payer: Self-pay | Admitting: *Deleted

## 2012-04-05 ENCOUNTER — Ambulatory Visit (INDEPENDENT_AMBULATORY_CARE_PROVIDER_SITE_OTHER): Payer: Medicare Other | Admitting: Internal Medicine

## 2012-04-05 ENCOUNTER — Encounter (INDEPENDENT_AMBULATORY_CARE_PROVIDER_SITE_OTHER): Payer: Self-pay | Admitting: Internal Medicine

## 2012-04-05 VITALS — BP 116/74 | HR 70 | Temp 98.0°F | Resp 20 | Ht 64.0 in | Wt 148.0 lb

## 2012-04-05 DIAGNOSIS — R109 Unspecified abdominal pain: Secondary | ICD-10-CM

## 2012-04-05 DIAGNOSIS — K5090 Crohn's disease, unspecified, without complications: Secondary | ICD-10-CM | POA: Diagnosis not present

## 2012-04-05 MED ORDER — CIPROFLOXACIN HCL 500 MG PO TABS: 500.0000 mg | ORAL_TABLET | Freq: Two times a day (BID) | ORAL | Status: AC

## 2012-04-05 MED ORDER — HYDROCODONE-ACETAMINOPHEN 5-500 MG PO TABS: 1.0000 | ORAL_TABLET | Freq: Three times a day (TID) | ORAL | Status: DC | PRN

## 2012-04-05 MED ORDER — PREDNISONE 20 MG PO TABS: 30.0000 mg | ORAL_TABLET | Freq: Every day | ORAL | Status: AC

## 2012-04-05 MED ORDER — METRONIDAZOLE 500 MG PO TABS: 500.0000 mg | ORAL_TABLET | Freq: Two times a day (BID) | ORAL | Status: DC

## 2012-04-05 NOTE — Patient Instructions (Addendum)
Decrease prednisone to 30 mg by mouth every day. Remicade/infliximab infusion to be scheduled.

## 2012-04-05 NOTE — Progress Notes (Signed)
Presenting complaint;  Followup for Crohn's disease. Subjective:  Danielle [name] is a 66 year old African American female who has over 20 year history of small bowel and perianal Crohn's disease who has been on maintenance with infliximab. Earlier this month she had I&D for perianal abscess. About 2 weeks ago she developed nausea vomiting and copious diarrhea with abdominal pain and was admitted to  [name] on 03/25/2012 and discharged on April 26,2013. She responded to medical therapy. She was seen by Dr. Christiana Pellant and associates from [facility] as well as by Dr. Karie Soda of [facility] surgery. She was noted to have small bowel stricture responded to medical therapy. She missed infliximab dose since she was hospitalized. Patient feels better. She is having 3-4 semi-phoned to mushy stools daily she denies melena or rectal bleeding. She complains of perianal soreness and scant moderate discharge. She is having intermittent abdominal cramps. She has a good appetite and has not experienced nausea and vomiting since she left the hospital. She has lost 9 pounds since her last visit of 11/17/2011.  Current Medications: Current Outpatient Prescriptions  Medication Sig Dispense Refill  . Calcium-Vitamin D 600-200 MG-UNIT per tablet Take 1 tablet by mouth daily. Do not resume this medication until it has been cleared with your Gastroenterologist.  30 tablet  3  . Cyanocobalamin (VITAMIN B-12 IJ) Inject as directed every 30 (thirty) days.        [name] dicyclomine (BENTYL) 10 MG capsule Take 1 capsule (10 mg total) by mouth 3 (three) times daily.  90 capsule  3  . HYDROcodone-acetaminophen (LORTAB) 7.5-500 MG per tablet Take 1 tablet by mouth every 8 (eight) hours as needed. For pain.  Use very sparingly as this medication will slow bowel motility and could contribute to a bowel obstruction.  45 tablet  0  . inFLIXimab (REMICADE) 100 MG injection Inject 100 mg into the vein every 6 (six) weeks. Discuss  with your gastroenterologist before resuming remicade.  1 each  0  . multivitamin (THERAGRAN) per tablet Take 1 tablet by mouth daily.  30 tablet  3  . nystatin-triamcinolone (MYCOLOG II) cream APPLY TOPICALLY 4 TIMES A DAY  30 g  0  . pantoprazole (PROTONIX) 40 MG tablet Take 1 tablet (40 mg total) by mouth daily.  30 tablet  5  . predniSONE (DELTASONE) 20 MG tablet Take 1.5 tablets (30 mg total) by mouth daily.  100 tablet  0  . Probiotic Product (PROBIOTIC FORMULA PO) Take 1 tablet by mouth daily.       . promethazine (PHENERGAN) 25 MG tablet Take 1 tablet (25 mg total) by mouth every 6 (six) hours as needed. Nausea.  Use sparingly as this will slow bowel motility and could contribute to bowel obstruction.  30 tablet  0  . SPIRONOLACTONE PO Take 25 mg by mouth daily.      [name] zolpidem (AMBIEN) 10 MG tablet Take 1 tablet (10 mg total) by mouth as needed. insomnia  30 tablet  0  . ciprofloxacin (CIPRO) 500 MG tablet Take 1 tablet (500 mg total) by mouth 2 (two) times daily.  30 tablet  0  . HYDROcodone-acetaminophen (VICODIN) 5-500 MG per tablet Take 1 tablet by mouth every 8 (eight) hours as needed for pain.  40 tablet  0  . metroNIDAZOLE (FLAGYL) 500 MG tablet Take 1 tablet (500 mg total) by mouth 2 (two) times daily with a meal.  30 tablet  0     Objective: Blood pressure 116/74, pulse  70, temperature 98 F (36.7 C), temperature source Oral, resp. rate 20, height 5\' 4"  (1.626 m), weight 148 lb (67.132 kg). Patient does not appear to be in any distress. She has slight round facies. Conjunctiva is pink. Sclera is nonicteric Oropharyngeal mucosa is normal. No neck masses or thyromegaly noted. Cardiac exam with regular rhythm normal S1 and S2. No murmur or gallop noted. Lungs are clear to auscultation. Abdomen is symmetrical. Bowel sounds are normal. On palpation soft abdomen with no tenderness organomegaly or masses. Rectal examination deferred.  No LE edema or clubbing  noted.  Labs/studies Results: CT enterography from 03/28/2012 reviewed with the patient.   Assessment:  Recent hospitalization for acute nausea vomiting and diarrhea thought to be due to a relapse of her Crohn's disease. Stool C. difficile was negative. 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 She remains with perianal discomfort and scant discharge status post I&D for perirectal abscess on 03/15/2012.   Plan:  Arrange for patient to get infliximab infusion as soon as possible. We will escalate dose to 10 mg/kg and will check drug levels and antibody at week 4. Decrease prednisone to 30 mg by mouth every morning and drop dose by 5 mg every week. Cipro 500 mg by mouth twice a day for 2 weeks. Metronidazole 500 mg by mouth twice a day for 2 weeks. New prescription given for Vicodin 5/500 3 times a day when necessary moderate to severe pain 40 doses without a refill. Serum potassium,CRP at the time of infliximab infusion. Office visit in one month.

## 2012-04-12 ENCOUNTER — Telehealth (INDEPENDENT_AMBULATORY_CARE_PROVIDER_SITE_OTHER): Payer: Self-pay | Admitting: *Deleted

## 2012-04-12 NOTE — Telephone Encounter (Signed)
Patient has 2 Rx for Hydrocodone. One for 7.5/500 and one for 5/500 that Dr. Karilyn Cota wrote.  I will let Dr .Karilyn Cota take care of this.  Patient is seeing 2 different providers and getting pain medications.

## 2012-04-12 NOTE — Telephone Encounter (Signed)
Danielle [name], Patient called and states that at the time of her office visit on 04/05/12 Dr. Karilyn Cota wrote her prescription wrong. He wrote at that time Hydrocodone 5/500 mg take 1 by mouth every 8 hours , patient states that this one makes her itch. She has previously been taking Hydrocodone 7.5/500 mg 1 by mouth every 8 hours. She is requesting that we call her Pharmacy and correct this.

## 2012-04-15 IMAGING — CT CT ABD-PELV W/ CM
1 of 2 series · 15 of 32 positions shown, 19 images · IV contrast (agent unspecified)
Comparison: Chest and two views abdomen earlier this same date.  CT
abdomen and pelvis 09/24/2010.

CLINICAL DATA: Abdominal pain, vomiting and diarrhea.  History of
Crohn's disease.  Partial colon resection and appendectomy.  Status
post cholecystectomy.

CT ABDOMEN AND PELVIS WITH CONTRAST
TECHNIQUE: Multidetector CT imaging of the abdomen and pelvis was
performed following the standard protocol during bolus
administration of intravenous contrast.
Contrast: 100 ml Pmnipaque-QRR.

[Series 2: rtn ap with st · axial · 0.63mm/px · z∈[+996,+1391]mm · 15 of 87 slices shown, 19 images]
[im 4/87  soft-tissue]
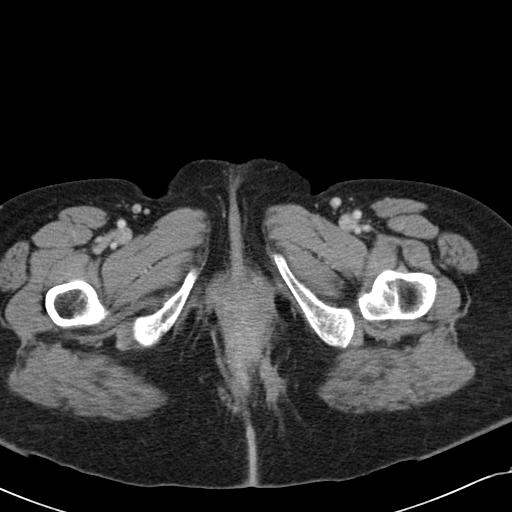
[im 4/87  bone]
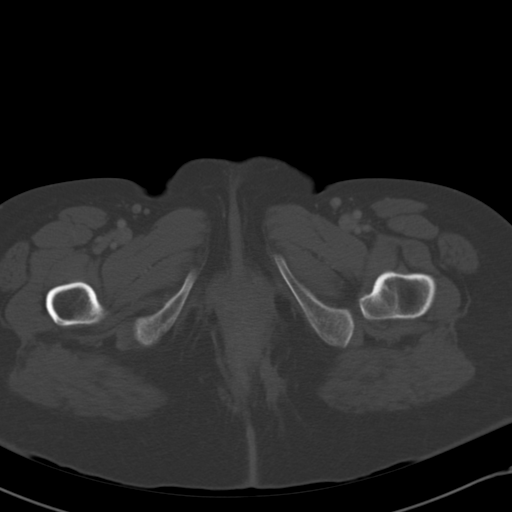
[im 12/87  soft-tissue]
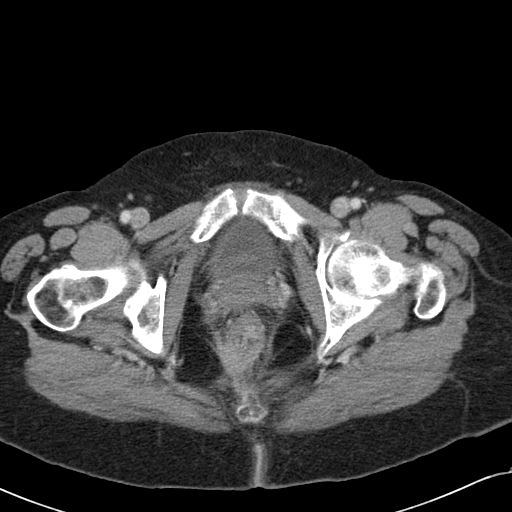
[im 19/87  soft-tissue]
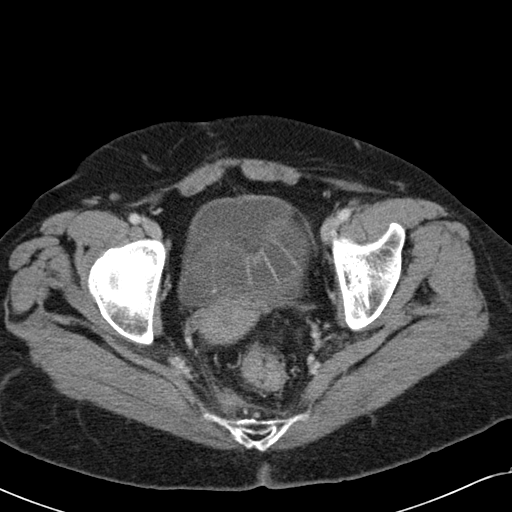
[im 23/87  soft-tissue]
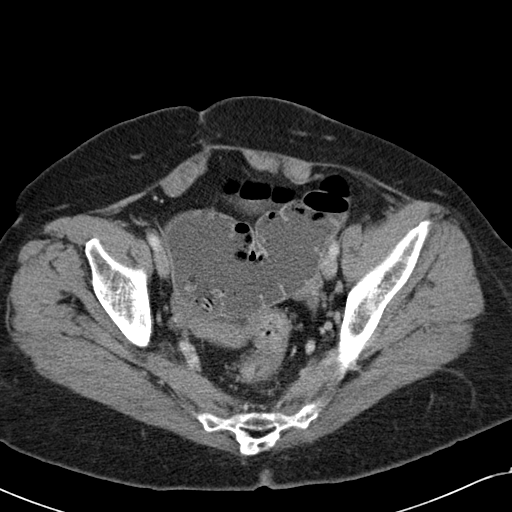
[im 30/87  soft-tissue]
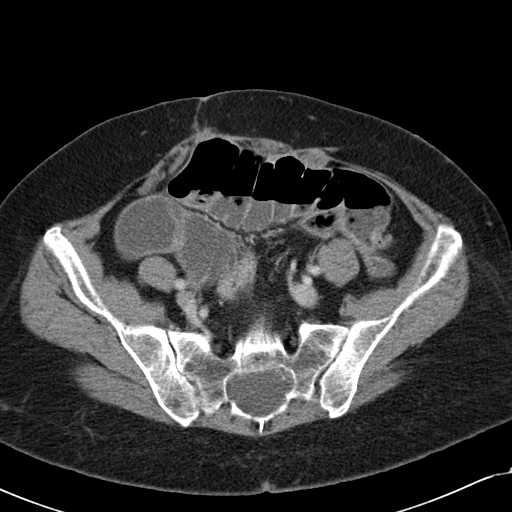
[im 38/87  soft-tissue]
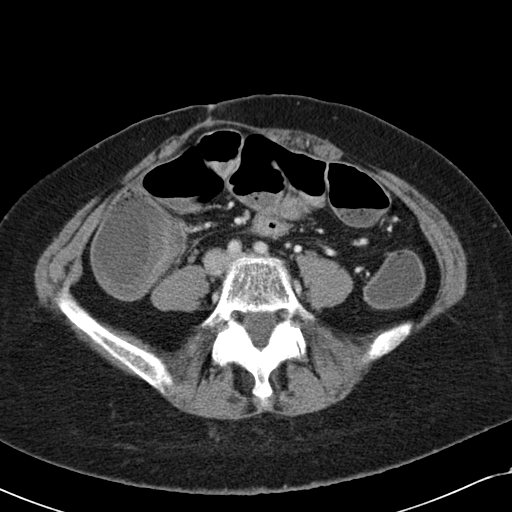
[im 45/87  soft-tissue]
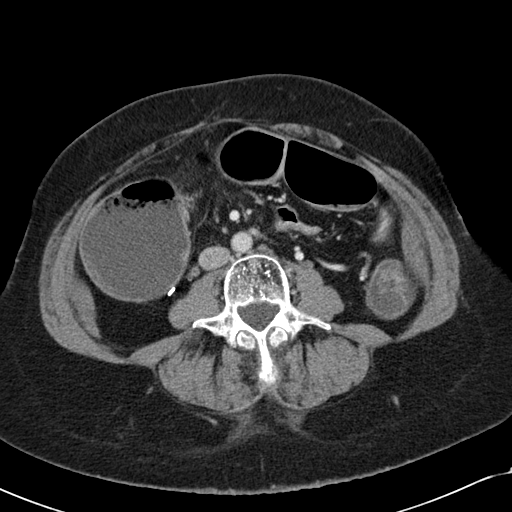
[im 49/87  soft-tissue]
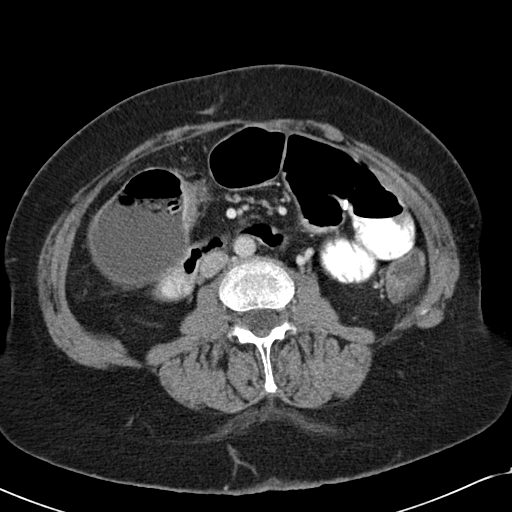
[im 57/87  soft-tissue]
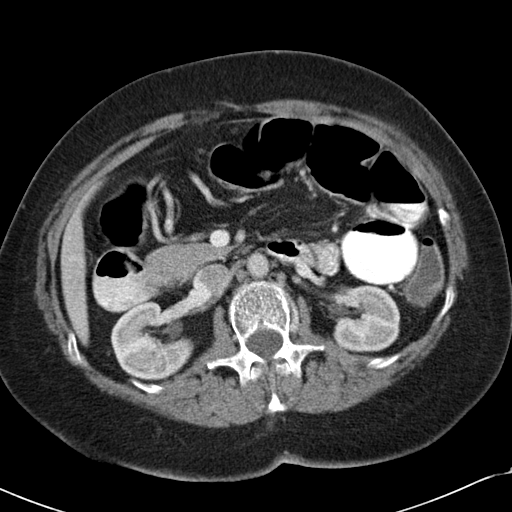
[im 57/87  bone]
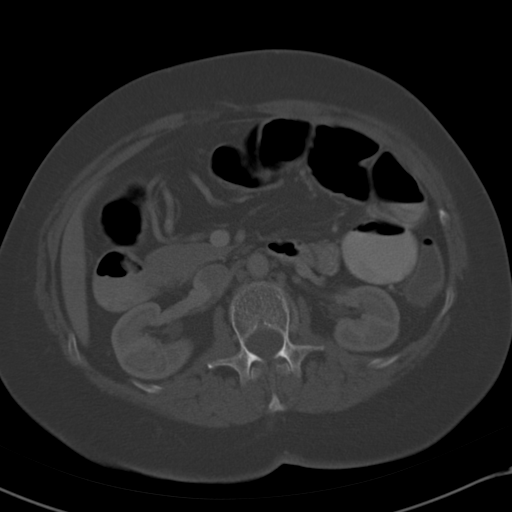
[im 64/87  soft-tissue]
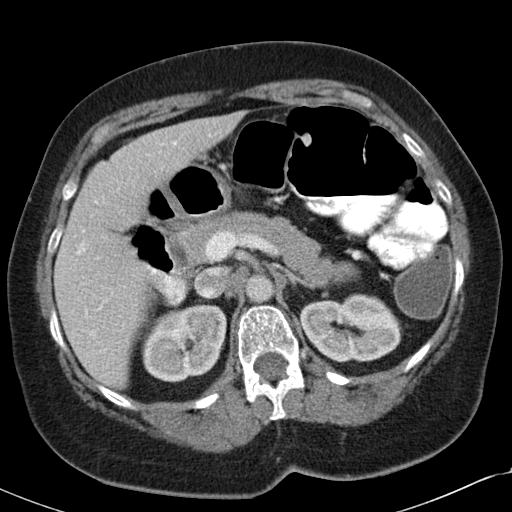
[im 68/87  soft-tissue]
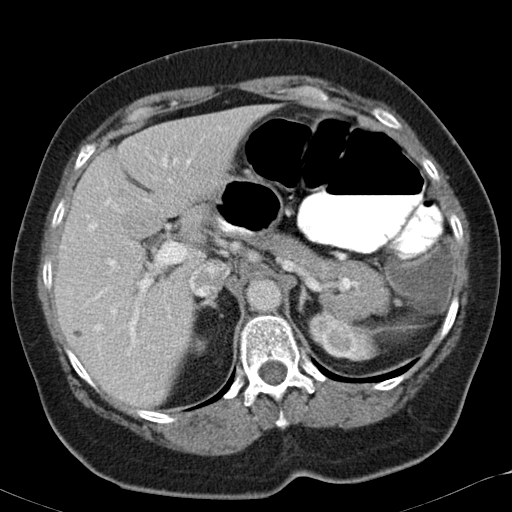
[im 72/87  lung]
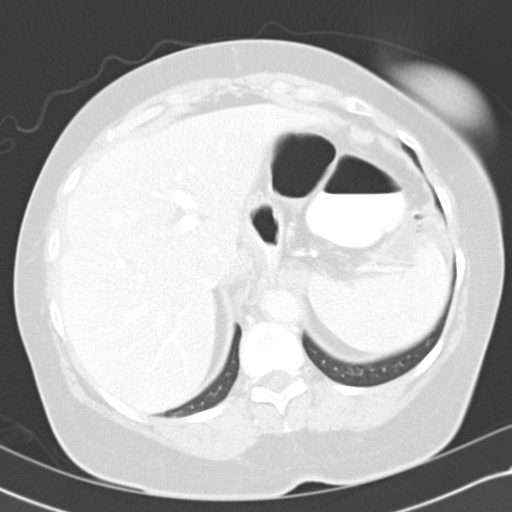
[im 75/87  soft-tissue]
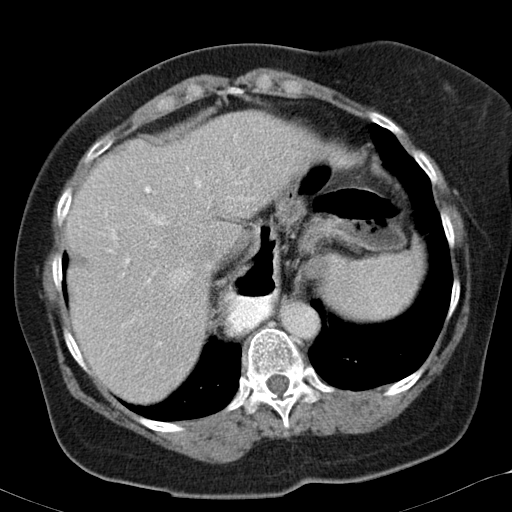
[im 75/87  lung]
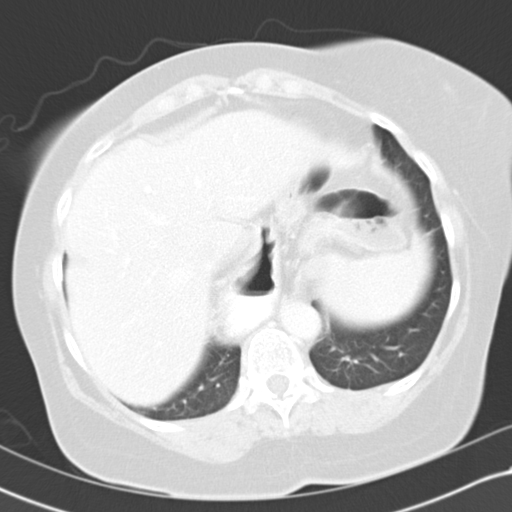
[im 79/87  lung]
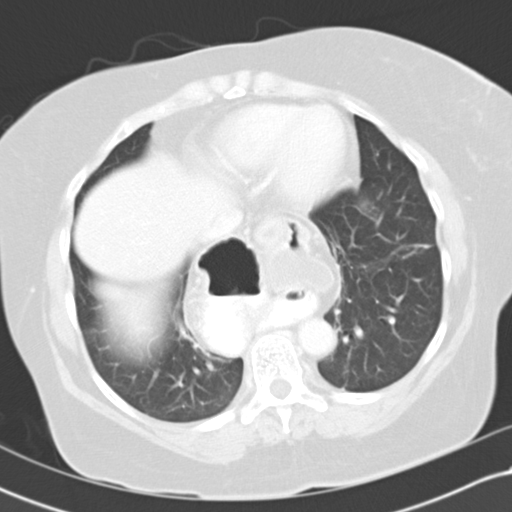
[im 83/87  soft-tissue]
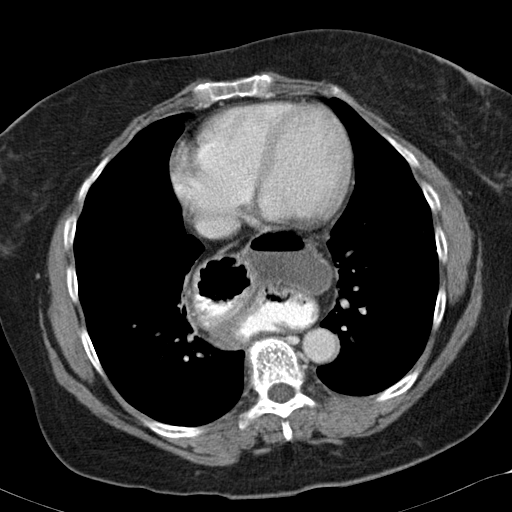
[im 83/87  lung]
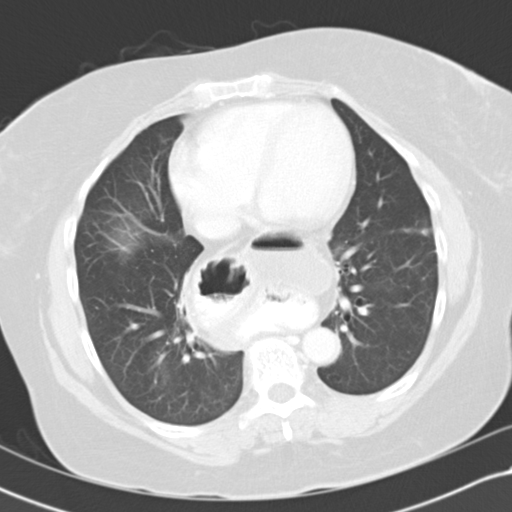

[15 of 32 positions shown; findings below may reference images not displayed]

FINDINGS: There is some dependent atelectasis in the lung bases.
No pleural or pericardial effusion.  The patient has a large hiatal
hernia.

In the right upper quadrant of the abdomen in a loop of small bowel
on image 36, there is a focal area of narrowing.  In retrospect,
this was present on the 09/24/2010 scan but is more conspicuous
today.  It is worrisome for carcinoma.  There is a small bowel
feces sign just proximal to this with marked dilatation of bowel.
The lesion may be causing partial obstruction.  The walls of the
rectosigmoid colon appear somewhat thickened without focal lesion.
There is incomplete distention of of bowel in this location.

The patient is status post cholecystectomy.  Tiny cyst in the right
hepatic lobe is unchanged.  The spleen, adrenal glands, pancreas
and kidneys appear normal.  There is no focal bony abnormality.
IMPRESSION: 1.  Focal narrowing of a loop of small bowel in the right upper
quadrant of the abdomen with an appearance worrisome the presence
of carcinoma.  Inflammatory stricture is within the differential.
The lesion appears to cause partial small bowel obstruction.
Findings discussed with Dr. Avinash at the time of interpretation.
2.  Large hiatal hernia.
3.  Thickening of the walls the rectosigmoid colon may be due to
under distention or inflammatory change.

## 2012-04-18 ENCOUNTER — Encounter (INDEPENDENT_AMBULATORY_CARE_PROVIDER_SITE_OTHER): Payer: Medicare Other | Admitting: General Surgery

## 2012-04-22 ENCOUNTER — Telehealth (INDEPENDENT_AMBULATORY_CARE_PROVIDER_SITE_OTHER): Payer: Self-pay | Admitting: *Deleted

## 2012-04-22 NOTE — Telephone Encounter (Signed)
Per Dr.Rehman may call in Vicodin 5/500 Take 1 by mouth Three Times a day for pain #40 no refills [facility]  Patient made aware

## 2012-04-22 NOTE — Telephone Encounter (Signed)
Danielle [name] called the office this morning asking how to take her Prednisone, this was reviewed with her. Currently she is in 20 mg. She stated that she was totally out of Hydrocodone 5/500 did not have any of any kind. Request a refill for Hydrocodone /Acetaminophen 5-500 mg Take 1 Tablet By Mouth Every 8 Hours as Needed For Pain # __________ , No Refills  [facility] [address] [place] [address] Phone Number [phone] Fax Number: [phone]

## 2012-04-23 NOTE — Telephone Encounter (Signed)
Script called in by Ms.Todd as directed.

## 2012-04-28 ENCOUNTER — Other Ambulatory Visit (INDEPENDENT_AMBULATORY_CARE_PROVIDER_SITE_OTHER): Payer: Self-pay | Admitting: Internal Medicine

## 2012-04-29 ENCOUNTER — Telehealth (INDEPENDENT_AMBULATORY_CARE_PROVIDER_SITE_OTHER): Payer: Self-pay | Admitting: *Deleted

## 2012-04-29 NOTE — Telephone Encounter (Signed)
Danielle [name] is requesting a new prescription for Protonix 40 mg 1 by mouth twice a day. She has been taking it twice a day due to her hernia causing more reflux.  Her hernia has gotten worse and she is asking if she should go ahead and have surgery? If so, 1- will she need another appointment with Dr.Rehman to discuss it , and 2- Should it be done in [place], [place], [place]?  Dr.Rehman has been paged.

## 2012-04-29 NOTE — Telephone Encounter (Signed)
Per Dr.Rehman may call in the Protonix 40 mg - Take 1 by mouth twice a day -1 Month - 11 refills- [facility]. Prior to be sent for a surgical consult per Dr.Rehman the patient will need to have an EGD first

## 2012-05-03 ENCOUNTER — Other Ambulatory Visit (INDEPENDENT_AMBULATORY_CARE_PROVIDER_SITE_OTHER): Payer: Self-pay | Admitting: *Deleted

## 2012-05-03 ENCOUNTER — Telehealth (INDEPENDENT_AMBULATORY_CARE_PROVIDER_SITE_OTHER): Payer: Self-pay | Admitting: *Deleted

## 2012-05-03 ENCOUNTER — Encounter (INDEPENDENT_AMBULATORY_CARE_PROVIDER_SITE_OTHER): Payer: Self-pay | Admitting: *Deleted

## 2012-05-03 DIAGNOSIS — K219 Gastro-esophageal reflux disease without esophagitis: Secondary | ICD-10-CM

## 2012-05-03 NOTE — Telephone Encounter (Signed)
EGD sch'd 05/18/12, patient aware, instructions mailed

## 2012-05-03 NOTE — Telephone Encounter (Signed)
PCP/Requesting MD: Mirna Mires  Name & DOB: Adriyana Greenbaum 21-Sep-2046     Procedure: EGD (per Dr Karilyn Cota)  Reason/Indication:  Acid reflux  Has patient had this procedure before?  yes  If so, when, by whom and where?  Last year  Is there a family history of colon cancer?    Who?  What age when diagnosed?    Is patient diabetic?   no      Does patient have prosthetic heart valve?  no  Do you have a pacemaker?  no  Has patient had joint replacement within last 12 months?  no  Is patient on Coumadin, Plavix and/or Aspirin? no  Medications: see EPIC  Allergies: see EPIC  Medication Adjustment:   Procedure date & time: 05/18/12 @ 200

## 2012-05-05 NOTE — Telephone Encounter (Signed)
agree

## 2012-05-06 ENCOUNTER — Telehealth (INDEPENDENT_AMBULATORY_CARE_PROVIDER_SITE_OTHER): Payer: Self-pay | Admitting: Internal Medicine

## 2012-05-06 NOTE — Telephone Encounter (Signed)
I talked with Danielle [name] this morning about her pain medication. She told me she was not receiving any pain medication from any other Dr.  [facility] [place] [facility] on her pain medication.  The Pharmacist informed me that Danielle [name] received Tylenol # 3 (No 16) from a Danielle [name].  So, I did not fill her pain medication.

## 2012-05-06 NOTE — Telephone Encounter (Signed)
Noted in already addressed by Ms. Setzer

## 2012-05-09 DIAGNOSIS — D510 Vitamin B12 deficiency anemia due to intrinsic factor deficiency: Secondary | ICD-10-CM | POA: Diagnosis not present

## 2012-05-10 ENCOUNTER — Telehealth (INDEPENDENT_AMBULATORY_CARE_PROVIDER_SITE_OTHER): Payer: Self-pay | Admitting: *Deleted

## 2012-05-10 NOTE — Telephone Encounter (Signed)
Danielle [name] has called the office several times in reference to getting her pain medication refilled. On Friday June 4 th , Danielle Ar NP, was going to refill this RX. After talking to the patient and she denying that she had rec'd any medication from any other Physician,then calling her Pharmacy,Danielle [name] learned that she had rec'd pain medication the day before from her dentist, therefore she canceled the Vicodin.  On 05-09-12 I talked with Dr.Rehman and he authorized me to call in the following medication: Vicodin 5/500 mg - Take 1 by mouth twice a day #40 and no refills/ [facility] Danielle [name].  Patient was called and made aware of why this had not been refilled and she was advised not to get pain medication from other Physicians. Danielle [name] agreed that she would not .

## 2012-05-12 ENCOUNTER — Encounter (HOSPITAL_COMMUNITY): Payer: Medicare Other | Attending: Oncology

## 2012-05-12 VITALS — BP 100/64 | HR 97 | Temp 98.0°F | Wt 154.8 lb

## 2012-05-12 DIAGNOSIS — K5090 Crohn's disease, unspecified, without complications: Secondary | ICD-10-CM | POA: Insufficient documentation

## 2012-05-12 LAB — C-REACTIVE PROTEIN: CRP: 3.24 mg/dL — ABNORMAL HIGH (ref <0.60)

## 2012-05-12 LAB — POTASSIUM: Potassium: 4.1 mEq/L (ref 3.5–5.1)

## 2012-05-12 MED ORDER — ACETAMINOPHEN 325 MG PO TABS
650.0000 mg | ORAL_TABLET | Freq: Once | ORAL | Status: AC
  Administered 2012-05-12: 500 mg via ORAL

## 2012-05-12 MED ORDER — SODIUM CHLORIDE 0.9 % IV SOLN
10.0000 mg/kg | Freq: Once | INTRAVENOUS | Status: AC
  Administered 2012-05-12: 700 mg via INTRAVENOUS
  Filled 2012-05-12: qty 70

## 2012-05-12 MED ORDER — SODIUM CHLORIDE 0.9 % IV SOLN
INTRAVENOUS | Status: DC
  Administered 2012-05-12: 13:00:00 via INTRAVENOUS

## 2012-05-12 MED ORDER — LORATADINE 10 MG PO TABS
10.0000 mg | ORAL_TABLET | Freq: Every day | ORAL | Status: DC
  Administered 2012-05-12: 10 mg via ORAL

## 2012-05-12 NOTE — Progress Notes (Signed)
the patient presents today for remicade at increased dose of 10 mg/kg per Dr. Karilyn Cota. Labs drawn as requested. Tolerated infusion well.

## 2012-05-13 ENCOUNTER — Telehealth (INDEPENDENT_AMBULATORY_CARE_PROVIDER_SITE_OTHER): Payer: Self-pay | Admitting: *Deleted

## 2012-05-13 ENCOUNTER — Encounter (HOSPITAL_COMMUNITY): Payer: Self-pay | Admitting: Pharmacy Technician

## 2012-05-13 ENCOUNTER — Other Ambulatory Visit (INDEPENDENT_AMBULATORY_CARE_PROVIDER_SITE_OTHER): Payer: Self-pay | Admitting: Internal Medicine

## 2012-05-13 ENCOUNTER — Telehealth (INDEPENDENT_AMBULATORY_CARE_PROVIDER_SITE_OTHER): Payer: Self-pay | Admitting: Internal Medicine

## 2012-05-13 MED ORDER — PROMETHAZINE HCL 25 MG PO TABS: 25.0000 mg | ORAL_TABLET | Freq: Four times a day (QID) | ORAL | Status: DC | PRN

## 2012-05-13 NOTE — Telephone Encounter (Signed)
Rx for Phenergan 25mg  po q 6 hrs with 3 refills e-prescribed

## 2012-05-13 NOTE — Telephone Encounter (Signed)
This medication was eprescribed

## 2012-05-13 NOTE — Telephone Encounter (Signed)
Patient called and states that she was needing a refill on her Promethazine. Her Pharmacy told her they would fax it to [place].  Patient ask if Delrae Rend would go ahead and call in this medication [facility] [place]. Camelia Eng was made aware.

## 2012-05-17 ENCOUNTER — Ambulatory Visit (INDEPENDENT_AMBULATORY_CARE_PROVIDER_SITE_OTHER): Payer: Medicare Other | Admitting: Internal Medicine

## 2012-05-17 MED ORDER — SODIUM CHLORIDE 0.45 % IV SOLN
Freq: Once | INTRAVENOUS | Status: AC
  Administered 2012-05-18: 14:00:00 via INTRAVENOUS

## 2012-05-18 ENCOUNTER — Encounter (HOSPITAL_COMMUNITY): Payer: Self-pay | Admitting: *Deleted

## 2012-05-18 ENCOUNTER — Encounter (HOSPITAL_COMMUNITY)
Admission: RE | Disposition: A | Discharge status: Home / Self Care | Payer: Self-pay | Source: Ambulatory Visit | Attending: Internal Medicine

## 2012-05-18 ENCOUNTER — Ambulatory Visit (HOSPITAL_COMMUNITY)
Admission: RE | Admit: 2012-05-18 | Discharge: 2012-05-18 | Disposition: A | Discharge status: Home / Self Care | Payer: Medicare Other | Source: Ambulatory Visit | Attending: Internal Medicine | Admitting: Internal Medicine

## 2012-05-18 DIAGNOSIS — K449 Diaphragmatic hernia without obstruction or gangrene: Secondary | ICD-10-CM | POA: Insufficient documentation

## 2012-05-18 DIAGNOSIS — K222 Esophageal obstruction: Secondary | ICD-10-CM | POA: Insufficient documentation

## 2012-05-18 DIAGNOSIS — K219 Gastro-esophageal reflux disease without esophagitis: Secondary | ICD-10-CM

## 2012-05-18 DIAGNOSIS — R1310 Dysphagia, unspecified: Secondary | ICD-10-CM | POA: Insufficient documentation

## 2012-05-18 HISTORY — PX: ESOPHAGOGASTRODUODENOSCOPY: SHX5428

## 2012-05-18 SURGERY — EGD (ESOPHAGOGASTRODUODENOSCOPY)
Anesthesia: Moderate Sedation

## 2012-05-18 MED ORDER — MIDAZOLAM HCL 5 MG/5ML IJ SOLN
INTRAMUSCULAR | Status: DC | PRN
  Administered 2012-05-18 (×4): 2 mg via INTRAVENOUS

## 2012-05-18 MED ORDER — MEPERIDINE HCL 50 MG/ML IJ SOLN
INTRAMUSCULAR | Status: AC
  Filled 2012-05-18: qty 1

## 2012-05-18 MED ORDER — BUTAMBEN-TETRACAINE-BENZOCAINE 2-2-14 % EX AERO
INHALATION_SPRAY | CUTANEOUS | Status: DC | PRN
  Administered 2012-05-18: 2 via TOPICAL

## 2012-05-18 MED ORDER — STERILE WATER FOR IRRIGATION IR SOLN
Status: DC | PRN
  Administered 2012-05-18: 14:00:00

## 2012-05-18 MED ORDER — MEPERIDINE HCL 25 MG/ML IJ SOLN
INTRAMUSCULAR | Status: DC | PRN
  Administered 2012-05-18: 20 mg via INTRAVENOUS

## 2012-05-18 MED ORDER — MIDAZOLAM HCL 5 MG/5ML IJ SOLN
INTRAMUSCULAR | Status: AC
  Filled 2012-05-18: qty 10

## 2012-05-18 NOTE — Op Note (Addendum)
EGD PROCEDURE REPORT  PATIENT:  Danielle [name]  MR#:  [id number] Birthdate:  [birth year], 66 y.o., female Endoscopist:  Dr. Malissa Hippo, MD Referred By:  Dr. [name], MD Procedure Date: 05/18/2012  Procedure:   EGD with ED.  Indications:  Patient is 66 year old African female with chronic GERD and  large hernia who  presents with intermittent solid food dysphagia. She is undergoing EGD with possible dilation.            Informed Consent:  The risks, benefits, alternatives & imponderables which include, but are not limited to, bleeding, infection, perforation, drug reaction and potential missed lesion have been reviewed.  The potential for biopsy, lesion removal, esophageal dilation, etc. have also been discussed.  Questions have been answered.  All parties agreeable.  Please see history & physical in medical record for more information.  Medications:  Demerol 20 mg IV Versed 8 mg IV Cetacaine spray topically for oropharyngeal anesthesia  Description of procedure:  The endoscope was introduced through the mouth and advanced to the second portion of the duodenum without difficulty or limitations. The mucosal surfaces were surveyed very carefully during advancement of the scope and upon withdrawal.  Findings:  Esophagus:  Short esophagus with tortuous body and noncritical ring just proximal to GE junction. No difficulty noted in passing scope across it. No stricture or ring noted at GE junction. GEJ:  29 cm Hiatus:  38 cm Stomach:  Stomach was empty. It distended very well with insufflation. Folds in the proximal stomach were normal. Mucosa at body, antrum, pyloric channel as well as angularis, fundus and cardia was normal. Large hernia well-seen on retroflex view. Duodenum:  Normal bulbar and post bulbar mucosa.  Therapeutic/Diagnostic Maneuvers Performed:  Esophageal ring was was performed using balloon dilator. It was advanced to the scope. Guidewire was pushed into gastric  lumen. Balloon dilator was positioned across distal esophagus and gradually insufflated to a diameter of 18 mm. Ring was disrupted. Was deflated withdrawn. Patient tolerated procedure well.  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 It was disrupted by dilating with  balloon to 18 mm. Large sliding hiatal hernia.  Recommendations:  Standard instructions given. Patient will call office with progress report in one week.  Alaiya Martindelcampo U  05/18/2012  2:38 PM  CC: Dr. [name], MD & Dr. Bonnetta Barry ref. provider found

## 2012-05-18 NOTE — H&P (Signed)
Danielle [name] is an 66 y.o. female.   Chief Complaint: Patient is here for esophagogastroduodenoscopy and esophageal dilation. HPI: Patient is 66 year old African female with complicated history of Crohn disease who also has developed large hiatal hernia and now presents with intermittent solid food dysphagia. She has intermittent heartburn no more than once a week. She denies epigastric pain or melena.  Past Medical History  Diagnosis Date  . Crohn's disease   . Chronic diarrhea   . Joint pain   . GERD (gastroesophageal reflux disease)   . Polyarthralgia   . Shortness of breath   . Pneumonia   . Anemia   . Hernia (acquired) (recurrent)     hiatal  . Hiatal hernia   . Back pain     Past Surgical History  Procedure Date  . Bowel resection   . Colostomy     [name]  . Anal stenosis   . Colonoscopy 2000    needs to have one  . Cholecystectomy   . Appendectomy   . Goiter 1999  . Esophagogastroduodenoscopy 11/11/2011    Procedure: ESOPHAGOGASTRODUODENOSCOPY (EGD);  Surgeon: Barrie Folk, MD;  Location: [facility] ENDOSCOPY;  Service: Endoscopy;  Laterality: N/A;  . Flexible sigmoidoscopy 11/11/2011    Procedure: FLEXIBLE SIGMOIDOSCOPY;  Surgeon: Barrie Folk, MD;  Location: [facility] ENDOSCOPY;  Service: Endoscopy;  Laterality: N/A;    Family History  Problem Relation Age of Onset  . Diabetes Mother   . Healthy Sister   . Hypertension Brother   . Colon cancer Brother     died with colon cancer   Social History:  reports that she quit smoking about 15 years ago. Her smoking use included Cigarettes. She has a 1 pack-year smoking history. She has never used smokeless tobacco. She reports that she does not drink alcohol or use illicit drugs.  Allergies:  Allergies  Allergen Reactions  . Aspirin Other (See Comments)    Cramps her stomach  . Sulfa Antibiotics Nausea And Vomiting    Medications Prior to Admission  Medication Sig Dispense Refill  . Calcium-Vitamin D 600-200 MG-UNIT per  tablet Take 1 tablet by mouth daily. Do not resume this medication until it has been cleared with your Gastroenterologist.  30 tablet  3  . Cyanocobalamin (VITAMIN B-12 IJ) Inject as directed every 30 (thirty) days.        [name] dicyclomine (BENTYL) 10 MG/5ML syrup Take 15 mg by mouth 4 (four) times daily -  before meals and at bedtime.      [name] HYDROcodone-acetaminophen (VICODIN) 5-500 MG per tablet Take 1 tablet by mouth every 8 (eight) hours as needed for pain.  40 tablet  0  . inFLIXimab (REMICADE) 100 MG injection Inject 100 mg into the vein every 6 (six) weeks. Discuss with your gastroenterologist before resuming remicade.  1 each  0  . multivitamin (THERAGRAN) per tablet Take 1 tablet by mouth daily.  30 tablet  3  . nystatin-triamcinolone (MYCOLOG II) cream APPLY TOPICALLY 4 TIMES A DAY  30 g  0  . pantoprazole (PROTONIX) 40 MG tablet Take 1 tablet (40 mg total) by mouth daily.  30 tablet  5  . Probiotic Product (PROBIOTIC FORMULA PO) Take 1 tablet by mouth daily.       . promethazine (PHENERGAN) 25 MG tablet Take 1 tablet (25 mg total) by mouth every 6 (six) hours as needed for nausea.  60 tablet  3  . spironolactone (ALDACTONE) 50 MG tablet Take 50 mg by  mouth daily.      [name] zolpidem (AMBIEN) 10 MG tablet Take 1 tablet (10 mg total) by mouth as needed. insomnia  30 tablet  0    No results found for this or any previous visit (from the past 48 hour(s)). No results found.  ROS  Blood pressure 115/76, pulse 93, temperature 98 F (36.7 C), temperature source Oral, resp. rate 18, height 5\' 4"  (1.626 m), weight 154 lb (69.854 kg), SpO2 100.00%. Physical Exam   Assessment/Plan Solid food dysphagia in a patient with history of chronic GERD and large hiatal hernia. EGD and possible ED  Danielle [name] U 05/18/2012, 2:10 PM

## 2012-05-18 NOTE — Discharge Instructions (Signed)
Resume usual medications and diet. No driving for 24 hours. Call office with progress report in one week.  Endoscopy Care After Please read the instructions outlined below and refer to this sheet in the next few weeks. These discharge instructions provide you with general information on caring for yourself after you leave the hospital. Your doctor may also give you specific instructions. While your treatment has been planned according to the most current medical practices available, unavoidable complications occasionally occur. If you have any problems or questions after discharge, please call your doctor. HOME CARE INSTRUCTIONS Activity  You may resume your regular activity but move at a slower pace for the next 24 hours.   Take frequent rest periods for the next 24 hours.   Walking will help expel (get rid of) the air and reduce the bloated feeling in your abdomen.   No driving for 24 hours (because of the anesthesia (medicine) used during the test).   You may shower.   Do not sign any important legal documents or operate any machinery for 24 hours (because of the anesthesia used during the test).  Nutrition  Drink plenty of fluids.   You may resume your normal diet.   Begin with a light meal and progress to your normal diet.   Avoid alcoholic beverages for 24 hours or as instructed by your caregiver.  Medications You may resume your normal medications unless your caregiver tells you otherwise. What you can expect today  You may experience abdominal discomfort such as a feeling of fullness or "gas" pains.   You may experience a sore throat for 2 to 3 days. This is normal. Gargling with salt water may help this.  Follow-up Your doctor will discuss the results of your test with you. SEEK IMMEDIATE MEDICAL CARE IF:  You have excessive nausea (feeling sick to your stomach) and/or vomiting.   You have severe abdominal pain and distention (swelling).   You have trouble  swallowing.   You have a temperature over 100 F (37.8 C).   You have rectal bleeding or vomiting of blood.  Document Released: 07/07/2004 Document Revised: 11/12/2011 Document Reviewed: 01/18/2008 [facility] Patient Information 2012 [place], [place].

## 2012-05-19 ENCOUNTER — Encounter (HOSPITAL_COMMUNITY): Payer: Self-pay | Admitting: Internal Medicine

## 2012-05-24 ENCOUNTER — Other Ambulatory Visit (INDEPENDENT_AMBULATORY_CARE_PROVIDER_SITE_OTHER): Payer: Self-pay | Admitting: Internal Medicine

## 2012-05-24 ENCOUNTER — Telehealth (INDEPENDENT_AMBULATORY_CARE_PROVIDER_SITE_OTHER): Payer: Self-pay | Admitting: *Deleted

## 2012-05-24 DIAGNOSIS — R109 Unspecified abdominal pain: Secondary | ICD-10-CM

## 2012-05-24 MED ORDER — CALCIUM-VITAMIN D 600-200 MG-UNIT PO TABS: 1.0000 | ORAL_TABLET | Freq: Every day | ORAL | Status: DC

## 2012-05-24 MED ORDER — HYDROCODONE-ACETAMINOPHEN 5-500 MG PO TABS: 1.0000 | ORAL_TABLET | Freq: Three times a day (TID) | ORAL | Status: DC | PRN

## 2012-05-24 NOTE — Telephone Encounter (Signed)
Normajean called back and would like to when should she have a f/u apt?

## 2012-05-24 NOTE — Telephone Encounter (Signed)
Ghina said she needed a new rx for hydrocodone, take 3 times daily [facility] [place].

## 2012-05-24 NOTE — Telephone Encounter (Signed)
She needs appointment after next Remicade infusion.

## 2012-05-25 ENCOUNTER — Telehealth (INDEPENDENT_AMBULATORY_CARE_PROVIDER_SITE_OTHER): Payer: Self-pay | Admitting: *Deleted

## 2012-05-25 NOTE — Telephone Encounter (Signed)
Clotine's wants you to call her today.Marland KitchenMarland Kitchen

## 2012-05-25 NOTE — Telephone Encounter (Signed)
Patient needed presciriptions called in this has been addressed

## 2012-05-26 NOTE — Telephone Encounter (Signed)
Apt has been scheduled for 06/28/12 at 9:00 am.

## 2012-06-14 DIAGNOSIS — R609 Edema, unspecified: Secondary | ICD-10-CM | POA: Diagnosis not present

## 2012-06-15 ENCOUNTER — Other Ambulatory Visit (INDEPENDENT_AMBULATORY_CARE_PROVIDER_SITE_OTHER): Payer: Self-pay | Admitting: *Deleted

## 2012-06-15 NOTE — Telephone Encounter (Signed)
We rec'd [facility] [place] on [address]  For a refill on her Hydrocodone 5/500 the patient is to take 1 tablet by mouth every 8 hours for pain #50 Per Dr.Rehman may refill this as written, [facility] and spoke with Dorene Grebe giving her the verbal order per Dr.Rehman. Patient was called and made aware.

## 2012-06-21 ENCOUNTER — Other Ambulatory Visit (INDEPENDENT_AMBULATORY_CARE_PROVIDER_SITE_OTHER): Payer: Self-pay | Admitting: Internal Medicine

## 2012-06-23 ENCOUNTER — Encounter (HOSPITAL_COMMUNITY): Payer: Medicare Other | Attending: Oncology

## 2012-06-23 VITALS — BP 107/61 | HR 89 | Temp 98.4°F | Wt 161.6 lb

## 2012-06-23 DIAGNOSIS — K5090 Crohn's disease, unspecified, without complications: Secondary | ICD-10-CM

## 2012-06-23 MED ORDER — SODIUM CHLORIDE 0.9 % IV SOLN
INTRAVENOUS | Status: DC
  Administered 2012-06-23: 13:00:00 via INTRAVENOUS

## 2012-06-23 MED ORDER — LORATADINE 10 MG PO TABS
10.0000 mg | ORAL_TABLET | Freq: Every day | ORAL | Status: DC
  Administered 2012-06-23: 10 mg via ORAL

## 2012-06-23 MED ORDER — ACETAMINOPHEN 325 MG PO TABS
650.0000 mg | ORAL_TABLET | Freq: Once | ORAL | Status: AC
  Administered 2012-06-23: 650 mg via ORAL

## 2012-06-23 MED ORDER — SODIUM CHLORIDE 0.9 % IJ SOLN
10.0000 mL | INTRAMUSCULAR | Status: DC | PRN
  Administered 2012-06-23: 10 mL via INTRAVENOUS

## 2012-06-23 MED ORDER — SODIUM CHLORIDE 0.9 % IV SOLN
10.0000 mg/kg | Freq: Once | INTRAVENOUS | Status: AC
  Administered 2012-06-23: 700 mg via INTRAVENOUS
  Filled 2012-06-23: qty 70

## 2012-06-23 NOTE — Progress Notes (Signed)
Tolerated well

## 2012-06-28 ENCOUNTER — Encounter (INDEPENDENT_AMBULATORY_CARE_PROVIDER_SITE_OTHER): Payer: Self-pay | Admitting: Internal Medicine

## 2012-06-28 ENCOUNTER — Ambulatory Visit (INDEPENDENT_AMBULATORY_CARE_PROVIDER_SITE_OTHER): Payer: Medicare Other | Admitting: Internal Medicine

## 2012-06-28 VITALS — BP 110/70 | HR 76 | Temp 96.9°F | Resp 20 | Ht 64.0 in | Wt 157.3 lb

## 2012-06-28 DIAGNOSIS — Z862 Personal history of diseases of the blood and blood-forming organs and certain disorders involving the immune mechanism: Secondary | ICD-10-CM | POA: Diagnosis not present

## 2012-06-28 DIAGNOSIS — R109 Unspecified abdominal pain: Secondary | ICD-10-CM | POA: Diagnosis not present

## 2012-06-28 DIAGNOSIS — Z8639 Personal history of other endocrine, nutritional and metabolic disease: Secondary | ICD-10-CM

## 2012-06-28 DIAGNOSIS — K5090 Crohn's disease, unspecified, without complications: Secondary | ICD-10-CM | POA: Diagnosis not present

## 2012-06-28 DIAGNOSIS — D649 Anemia, unspecified: Secondary | ICD-10-CM

## 2012-06-28 MED ORDER — HYDROCODONE-ACETAMINOPHEN 7.5-500 MG PO TABS: 1.0000 | ORAL_TABLET | Freq: Two times a day (BID) | ORAL | Status: AC | PRN

## 2012-06-28 MED ORDER — ALIGN PO CAPS: 1.0000 | ORAL_CAPSULE | Freq: Every day | ORAL | Status: AC

## 2012-06-28 NOTE — Progress Notes (Signed)
Presenting complaint;  Followup for Crohn's disease.  Subjective:  Danielle [name] is 66 year old Afro-American female who is here for scheduled visit. She was last seen in the office on 03/19/2012. She states she is doing much better. She did not have to go to emergency room or be hospitalized since her last visit. She has gained 9 pounds since her last visit. She received a Remicade infusion on 06/23/2012. She is now on maintenance schedule every 8 weeks. She reports no side effects with therapy. She is having intermittent diarrhea. On her worst day she may have as many as 4 stools. She is having perianal discomfort secondary to flareup with her hemorrhoids. She denies fever or chills. She has good appetite. She is having intermittent abdominal cramps. She says Vicodin 5 500 does not work. She is not taking pain medication on daily basis. She denies rectal bleeding. Few weeks ago she noted peeling to skin of her palms and soles. She wonders why. She did not have skin rash elsewhere.  Current Medications: Current Outpatient Prescriptions  Medication Sig Dispense Refill  . Calcium-Vitamin D 600-200 MG-UNIT per tablet Take 1 tablet by mouth daily. Do not resume this medication until it has been cleared with your Gastroenterologist.  30 tablet  3  . Cyanocobalamin (VITAMIN B-12 IJ) Inject as directed every 30 (thirty) days.        [name] dicyclomine (BENTYL) 10 MG/5ML syrup Take 15 mg by mouth 4 (four) times daily -  before meals and at bedtime.      [name] HYDROcodone-acetaminophen (VICODIN) 5-500 MG per tablet Take 1 tablet by mouth every 8 (eight) hours as needed for pain.  50 tablet  0  . inFLIXimab (REMICADE) 100 MG injection Inject 100 mg into the vein every 6 (six) weeks. Discuss with your gastroenterologist before resuming remicade.  1 each  0  . multivitamin (THERAGRAN) per tablet Take 1 tablet by mouth daily.  30 tablet  3  . nystatin-triamcinolone (MYCOLOG II) cream APPLY TO AFFECTED AREA TWICE A DAY  30 g  5    . pantoprazole (PROTONIX) 40 MG tablet Take 1 tablet (40 mg total) by mouth daily.  30 tablet  5  . Probiotic Product (PROBIOTIC FORMULA PO) Take 1 tablet by mouth daily.       [name] spironolactone (ALDACTONE) 50 MG tablet Take 50 mg by mouth daily.      [name] zolpidem (AMBIEN) 10 MG tablet Take 1 tablet (10 mg total) by mouth as needed. insomnia  30 tablet  0  . promethazine (PHENERGAN) 25 MG tablet Take 1 tablet (25 mg total) by mouth every 6 (six) hours as needed for nausea.  60 tablet  3     Objective: Blood pressure 110/70, pulse 76, temperature 96.9 F (36.1 C), temperature source Oral, resp. rate 20, height 5\' 4"  (1.626 m), weight 157 lb 4.8 oz (71.351 kg). Patient is alert and in no acute distress. Conjunctiva is pink. Sclera is nonicteric Oropharyngeal mucosa is normal. No neck masses or thyromegaly noted. Cardiac exam with regular rhythm normal S1 and S2. No murmur or gallop noted. Lungs are clear to auscultation. Abdomen is full. Bowel sounds are hyperactive. Abdomen is soft and nontender without organomegaly or masses. No LE edema or clubbing noted.  Labs/studies Results: Lab data from 03/31/2012 WBC 10.6, hemoglobin 9, hematocrit 27.3 a platelet count 308K. Serum sodium 135 potassium 3.8 chloride 1:15 CO2 11 glucose 133 BUN 7 creatinine 1.02 calcium 8.4   Assessment:  #1. Ileal and and  anorectal Crohn disease. She is currently on Remicade for maintenance and appears to be doing well. She has been admitted to the hospital a few times in the last year for small bowel obstruction and  responded to medical therapy. #2. Anemia. Will repeat her CBC before further workup considered. #3. History of hypokalemia. Serum potassium has been normal since he's been on low-dose spironolactone. #4. Low CO2. 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 Her CO2 was normal in January,2013 and 18 in April. He needs to be repeated for further workup or nephrology consultation  sought.   Plan:  Continue Remicade infusion every 8 weeks. Change pain medication to hydrocodone/acetaminophen 7.5/500 one twice a day when necessary 50 given without refill. Can take OTC Imodium 2 mg 3 times a day when necessary. She will go to the lab for CBC, metabolic 7 and CRP. Office visit in 3 months.

## 2012-06-28 NOTE — Patient Instructions (Signed)
Physician will contact you with results of blood work. Can take Imodium OTC 2 mg up to twice a day as needed for diarrhea

## 2012-07-04 ENCOUNTER — Telehealth (INDEPENDENT_AMBULATORY_CARE_PROVIDER_SITE_OTHER): Payer: Self-pay | Admitting: *Deleted

## 2012-07-04 NOTE — Telephone Encounter (Signed)
Patient wants you to mail her the lab from her last OV so she can take it to lab in [place], if you have any questions please call her

## 2012-07-05 ENCOUNTER — Other Ambulatory Visit (INDEPENDENT_AMBULATORY_CARE_PROVIDER_SITE_OTHER): Payer: Self-pay | Admitting: Internal Medicine

## 2012-07-05 NOTE — Telephone Encounter (Signed)
Lab orders mailed to [facility]

## 2012-07-05 NOTE — Telephone Encounter (Signed)
Left message for Danielle [name] to call her back 9:53  Called patient and what she wants is a refill on her Flagyl

## 2012-07-05 NOTE — Telephone Encounter (Signed)
Danielle [name] has taken care of this.

## 2012-07-08 ENCOUNTER — Other Ambulatory Visit (INDEPENDENT_AMBULATORY_CARE_PROVIDER_SITE_OTHER): Payer: Self-pay | Admitting: Internal Medicine

## 2012-07-08 MED ORDER — SPIRONOLACTONE 50 MG PO TABS: 50.0000 mg | ORAL_TABLET | Freq: Every day | ORAL | Status: DC

## 2012-07-14 ENCOUNTER — Other Ambulatory Visit (INDEPENDENT_AMBULATORY_CARE_PROVIDER_SITE_OTHER): Payer: Self-pay | Admitting: Internal Medicine

## 2012-07-14 DIAGNOSIS — D649 Anemia, unspecified: Secondary | ICD-10-CM | POA: Diagnosis not present

## 2012-07-14 DIAGNOSIS — K5090 Crohn's disease, unspecified, without complications: Secondary | ICD-10-CM | POA: Diagnosis not present

## 2012-07-14 LAB — BASIC METABOLIC PANEL
BUN: 12 mg/dL (ref 6–23)
CO2: 20 mEq/L (ref 19–32)
Chloride: 108 mEq/L (ref 96–112)
Glucose, Bld: 77 mg/dL (ref 70–99)
Potassium: 4.3 mEq/L (ref 3.5–5.3)
Sodium: 137 mEq/L (ref 135–145)

## 2012-07-14 LAB — CBC
HCT: 36.4 % (ref 36.0–46.0)
Hemoglobin: 11.6 g/dL — ABNORMAL LOW (ref 12.0–15.0)
MCHC: 31.9 g/dL (ref 30.0–36.0)
RBC: 4.22 MIL/uL (ref 3.87–5.11)
WBC: 15.9 10*3/uL — ABNORMAL HIGH (ref 4.0–10.5)

## 2012-07-15 LAB — C-REACTIVE PROTEIN: CRP: <0.5 mg/dL (ref <0.60)

## 2012-07-22 ENCOUNTER — Encounter (INDEPENDENT_AMBULATORY_CARE_PROVIDER_SITE_OTHER): Payer: Self-pay | Admitting: *Deleted

## 2012-08-04 ENCOUNTER — Encounter (HOSPITAL_COMMUNITY): Payer: Medicare Other | Attending: Oncology

## 2012-08-04 VITALS — Wt 152.0 lb

## 2012-08-04 DIAGNOSIS — K5090 Crohn's disease, unspecified, without complications: Secondary | ICD-10-CM | POA: Diagnosis not present

## 2012-08-04 MED ORDER — SODIUM CHLORIDE 0.9 % IV SOLN
400.0000 mg | Freq: Once | INTRAVENOUS | Status: AC
  Administered 2012-08-04: 400 mg via INTRAVENOUS
  Filled 2012-08-04: qty 40

## 2012-08-04 MED ORDER — ACETAMINOPHEN 325 MG PO TABS
650.0000 mg | ORAL_TABLET | Freq: Once | ORAL | Status: AC
  Administered 2012-08-04: 650 mg via ORAL

## 2012-08-04 MED ORDER — LORATADINE 10 MG PO TABS
10.0000 mg | ORAL_TABLET | Freq: Every day | ORAL | Status: DC
  Administered 2012-08-04: 10 mg via ORAL

## 2012-08-04 MED ORDER — SODIUM CHLORIDE 0.9 % IV SOLN
300.0000 mg | Freq: Once | INTRAVENOUS | Status: AC
  Administered 2012-08-04: 300 mg via INTRAVENOUS
  Filled 2012-08-04: qty 30

## 2012-08-04 MED ORDER — SODIUM CHLORIDE 0.9 % IV SOLN
10.0000 mg/kg | Freq: Once | INTRAVENOUS | Status: DC
  Filled 2012-08-04 (×2): qty 70

## 2012-08-04 MED ORDER — SODIUM CHLORIDE 0.9 % IV SOLN
Freq: Once | INTRAVENOUS | Status: AC
  Administered 2012-08-04: 14:00:00 via INTRAVENOUS

## 2012-08-04 MED ORDER — SODIUM CHLORIDE 0.9 % IJ SOLN
10.0000 mL | Freq: Once | INTRAMUSCULAR | Status: AC
  Administered 2012-08-04: 10 mL via INTRAVENOUS

## 2012-08-04 NOTE — Progress Notes (Signed)
Tolerated infusion well. 

## 2012-08-14 ENCOUNTER — Other Ambulatory Visit (INDEPENDENT_AMBULATORY_CARE_PROVIDER_SITE_OTHER): Payer: Self-pay | Admitting: Internal Medicine

## 2012-08-22 DIAGNOSIS — D510 Vitamin B12 deficiency anemia due to intrinsic factor deficiency: Secondary | ICD-10-CM | POA: Diagnosis not present

## 2012-09-02 ENCOUNTER — Telehealth (INDEPENDENT_AMBULATORY_CARE_PROVIDER_SITE_OTHER): Payer: Self-pay | Admitting: *Deleted

## 2012-09-02 NOTE — Telephone Encounter (Signed)
Per Dr.Rehman the patient needs to be evaluated at a pain management, if the patient is in agreement to this. He will not fill this prescription at this time. Patient will be made aware, and I will forward to Ann to make the arrangements

## 2012-09-02 NOTE — Telephone Encounter (Signed)
Forward to Ann

## 2012-09-02 NOTE — Telephone Encounter (Signed)
Patient left a message on voicemail. She is need of her refill on the Hydrocodone 5/500. She states that she would like for it to be TID versed BID. Dr.Rehman at one time had her taking it TID. She says that this is the only way that she can get it refilled. She may be reached at [phone] Last filled by Dorene Ar 08-20-12.

## 2012-09-05 NOTE — Telephone Encounter (Signed)
Patient has [place] MCD so referral will need to be done by her PCP, Dr Loleta Chance, his office is aware and asked that patient call them to set up OV so they can make referral, patient is aware

## 2012-09-08 ENCOUNTER — Ambulatory Visit (HOSPITAL_COMMUNITY): Payer: Medicare Other

## 2012-09-08 DIAGNOSIS — K5090 Crohn's disease, unspecified, without complications: Secondary | ICD-10-CM | POA: Diagnosis not present

## 2012-09-13 IMAGING — CT CT HEAD W/O CM
3 series · 16 of 30 positions shown, 17 images · non-contrast
Comparison: None.

CT HEAD

CLINICAL DATA: Pain, headache.

CT HEAD WITHOUT CONTRAST
CT CERVICAL SPINE WITHOUT CONTRAST
TECHNIQUE: Multidetector CT imaging of the head and cervical spine
was performed following the standard protocol without intravenous
contrast.  Multiplanar CT image reconstructions of the cervical
spine were also generated.

[Series 3: head w/o · axial · non-contrast · 0.40mm/px · z∈[+1075,+1160]mm · 4 of 29 slices shown, 5 images]
[im 6/29  brain]
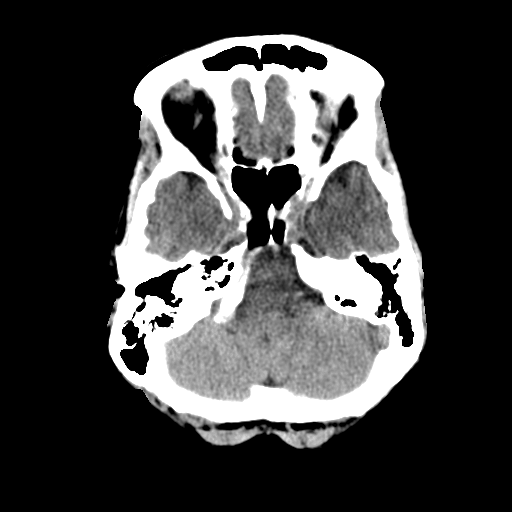
[im 6/29  bone]
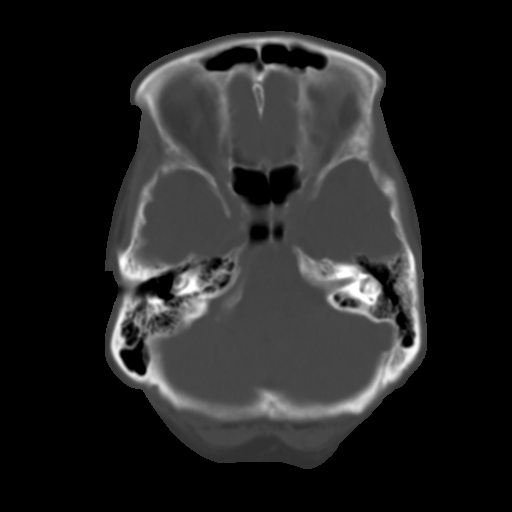
[im 12/29  brain]
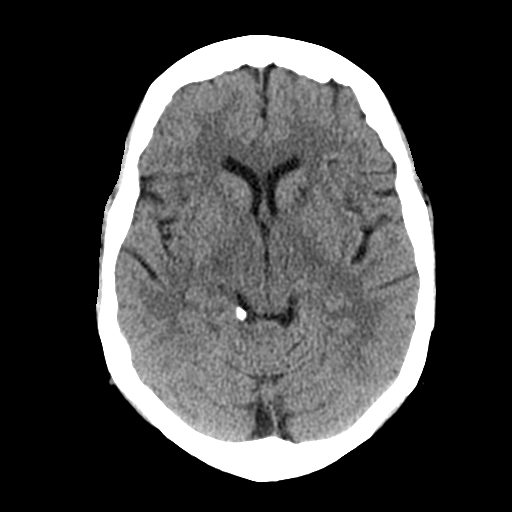
[im 17/29  brain]
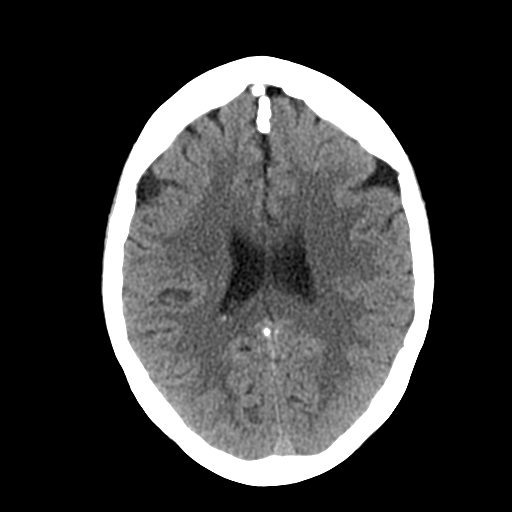
[im 23/29  brain]
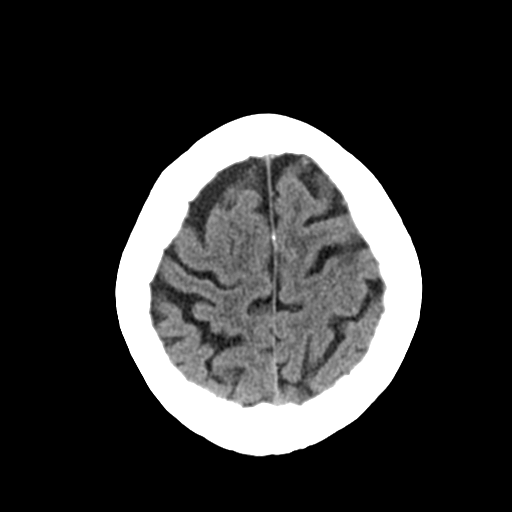

[Series 4: bone windows · axial · 0.40mm/px · z∈[+1075,+1160]mm · 4 of 29 slices shown]
[im 6/29  bone]
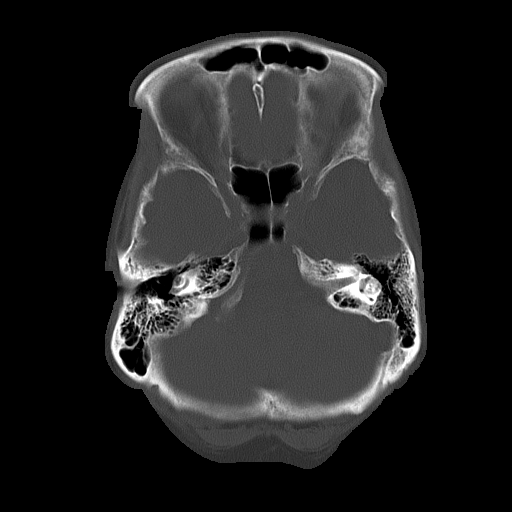
[im 12/29  bone]
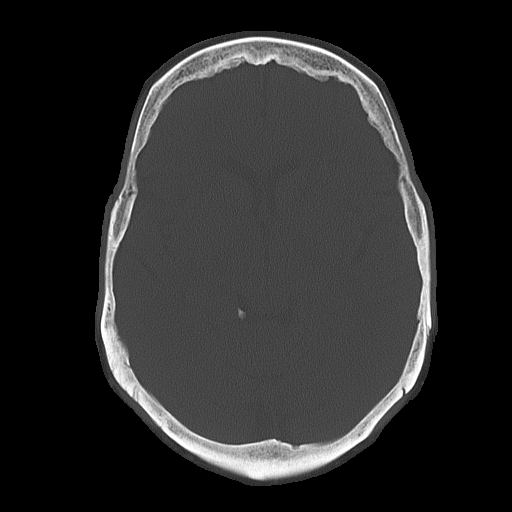
[im 17/29  bone]
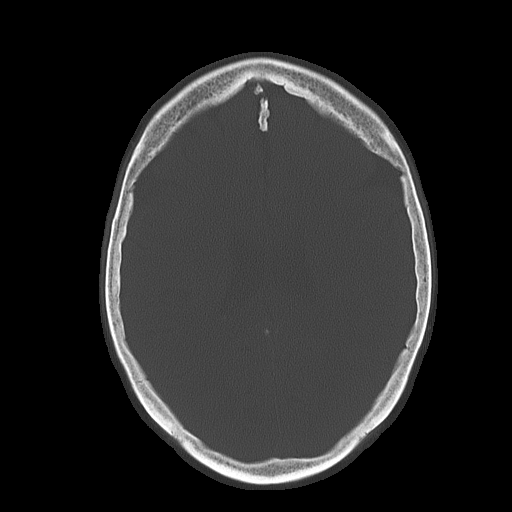
[im 23/29  bone]
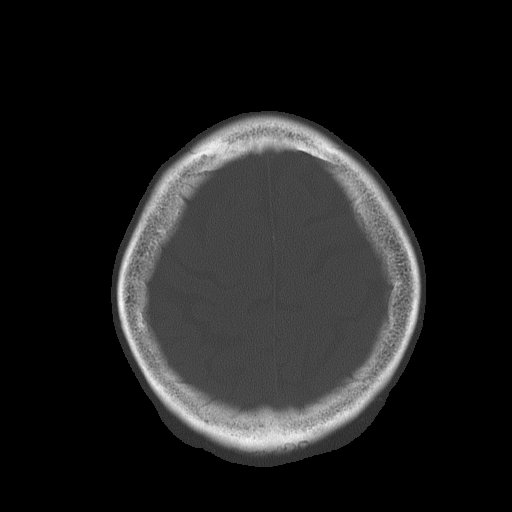

[Series 5: c-spine st · axial · 0.26mm/px · z∈[+922,+1062]mm · 8 of 85 slices shown]
[im 10/85  brain]
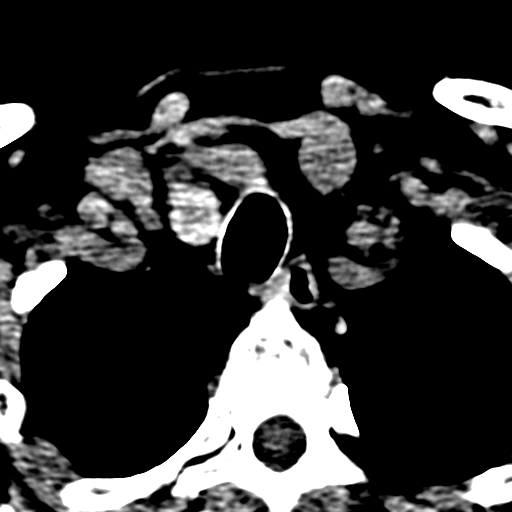
[im 20/85  brain]
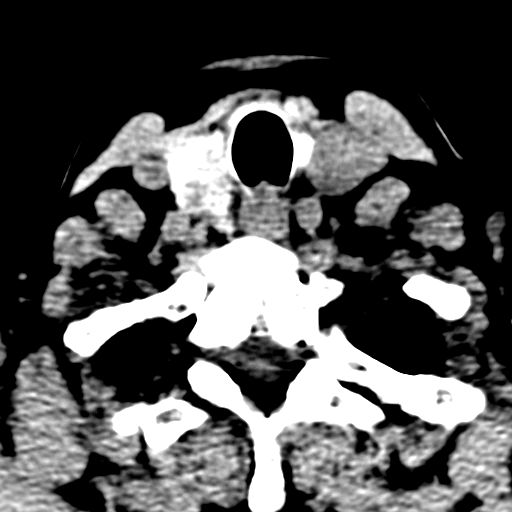
[im 30/85  brain]
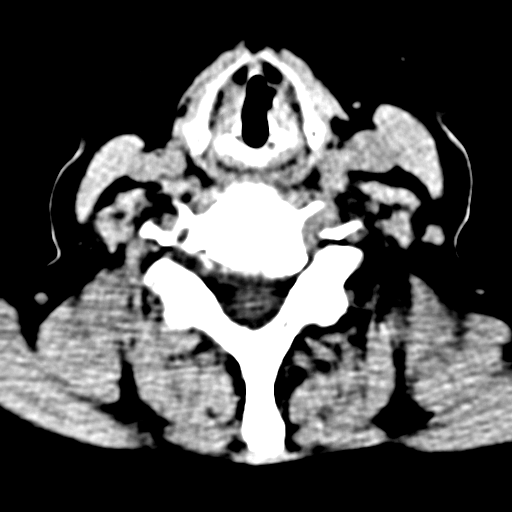
[im 40/85  brain]
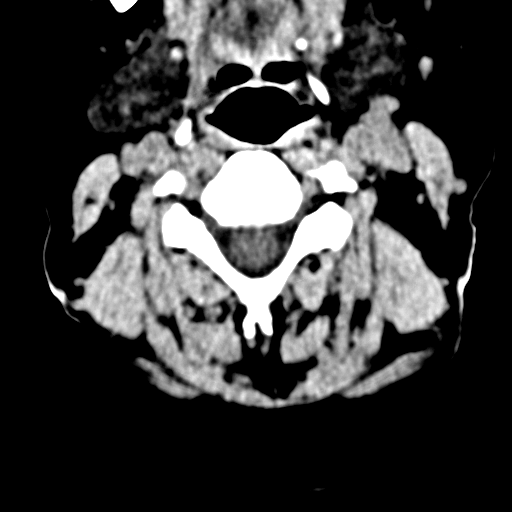
[im 50/85  brain]
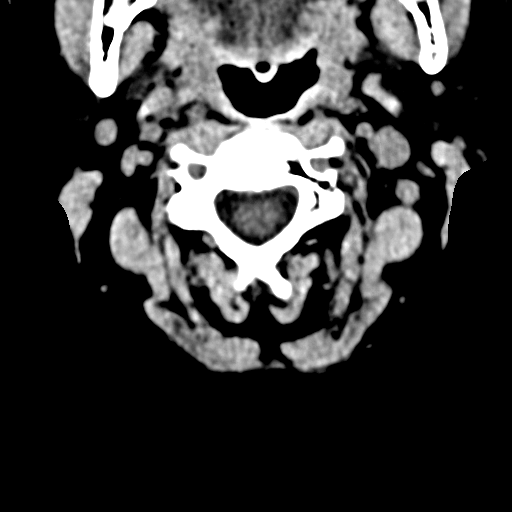
[im 60/85  brain]
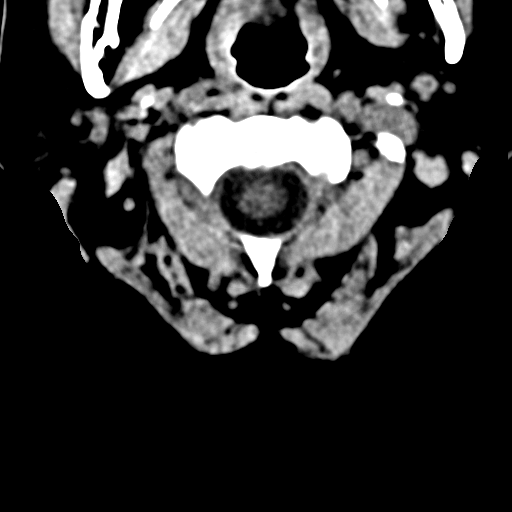
[im 70/85  brain]
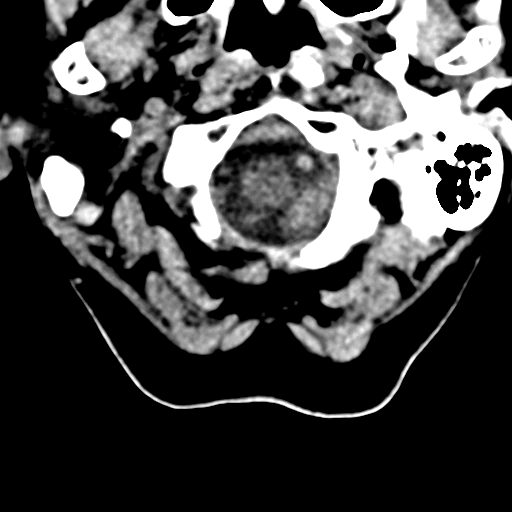
[im 80/85  brain]
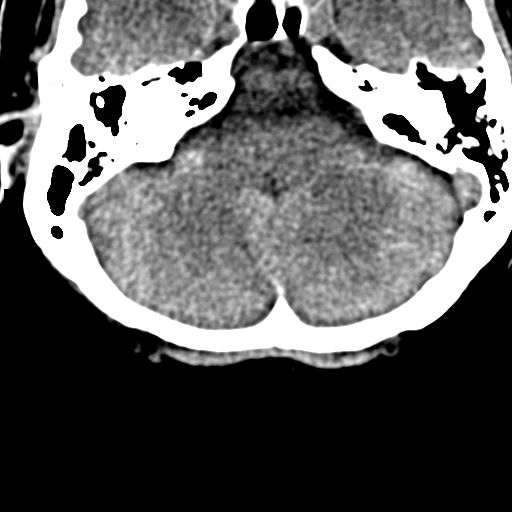

[16 of 30 positions shown; findings below may reference images not displayed]

FINDINGS: Old appearing lacunar infarct in the anterior horn of the
left internal capsule.  Mild atrophy. No acute intracranial
abnormality.  Specifically, no hemorrhage, hydrocephalus, mass
lesion, acute infarction, or significant intracranial injury.  No
acute calvarial abnormality. Visualized paranasal sinuses and
mastoids clear.  Orbital soft tissues unremarkable.
IMPRESSION: No acute intracranial abnormality.

CT CERVICAL SPINE
FINDINGS: Mild degenerative disc disease at C5-6 and C6-7.  Normal
alignment.  No fracture.  No epidural or paraspinal hematoma.

Multiple nodules are seen in the right thyroid lobe.  Left thyroid
lobe is very small.
IMPRESSION: Mild spondylosis.  No acute bony abnormality.

Right thyroid nodules with atrophic left thyroid lobe.  This can be
further evaluated with nonemergent thyroid ultrasound.

## 2012-09-21 ENCOUNTER — Encounter (HOSPITAL_COMMUNITY): Payer: Medicare Other | Attending: Oncology

## 2012-09-21 VITALS — BP 106/71 | HR 85 | Temp 97.8°F | Resp 16

## 2012-09-21 DIAGNOSIS — K5090 Crohn's disease, unspecified, without complications: Secondary | ICD-10-CM | POA: Diagnosis not present

## 2012-09-21 MED ORDER — LORATADINE 10 MG PO TABS
10.0000 mg | ORAL_TABLET | Freq: Every day | ORAL | Status: DC
  Administered 2012-09-21: 10 mg via ORAL

## 2012-09-21 MED ORDER — SODIUM CHLORIDE 0.9 % IV SOLN
INTRAVENOUS | Status: DC
  Administered 2012-09-21: 13:00:00 via INTRAVENOUS

## 2012-09-21 MED ORDER — ACETAMINOPHEN 325 MG PO TABS
650.0000 mg | ORAL_TABLET | Freq: Once | ORAL | Status: AC
  Administered 2012-09-21: 650 mg via ORAL

## 2012-09-21 MED ORDER — SODIUM CHLORIDE 0.9 % IJ SOLN
10.0000 mL | INTRAMUSCULAR | Status: DC | PRN
  Administered 2012-09-21: 10 mL via INTRAVENOUS

## 2012-09-21 MED ORDER — SODIUM CHLORIDE 0.9 % IV SOLN
700.0000 mg | Freq: Once | INTRAVENOUS | Status: AC
  Administered 2012-09-21: 700 mg via INTRAVENOUS
  Filled 2012-09-21: qty 70

## 2012-09-21 NOTE — Progress Notes (Signed)
Tolerated infusion well. 

## 2012-09-27 ENCOUNTER — Ambulatory Visit (INDEPENDENT_AMBULATORY_CARE_PROVIDER_SITE_OTHER): Payer: Medicare Other | Admitting: Internal Medicine

## 2012-09-27 DIAGNOSIS — D510 Vitamin B12 deficiency anemia due to intrinsic factor deficiency: Secondary | ICD-10-CM | POA: Diagnosis not present

## 2012-10-03 ENCOUNTER — Other Ambulatory Visit (INDEPENDENT_AMBULATORY_CARE_PROVIDER_SITE_OTHER): Payer: Self-pay | Admitting: Internal Medicine

## 2012-10-26 IMAGING — CR DG ABDOMEN ACUTE W/ 1V CHEST
4 series · 4 of 4 positions shown · non-contrast
Comparison: 04/02/2011 CT

CLINICAL DATA: Abdominal pain.

ACUTE ABDOMEN SERIES (ABDOMEN 2 VIEW & CHEST 1 VIEW)

[w chest pa]
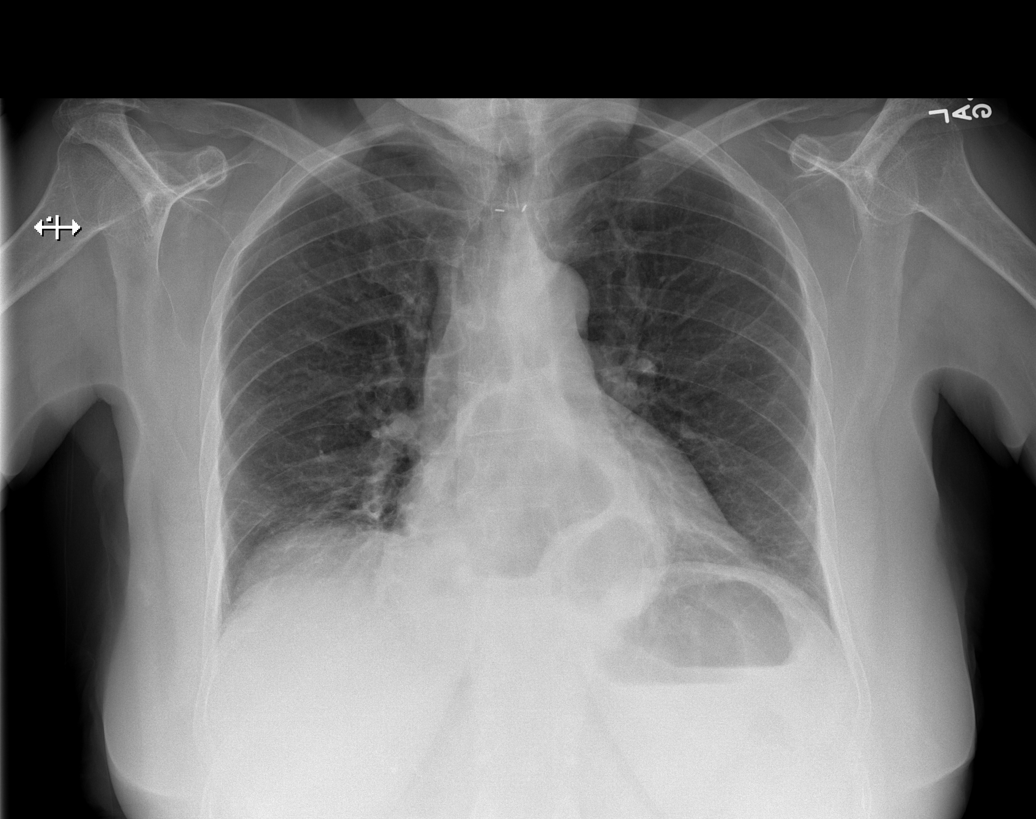

[w abdomen upright]
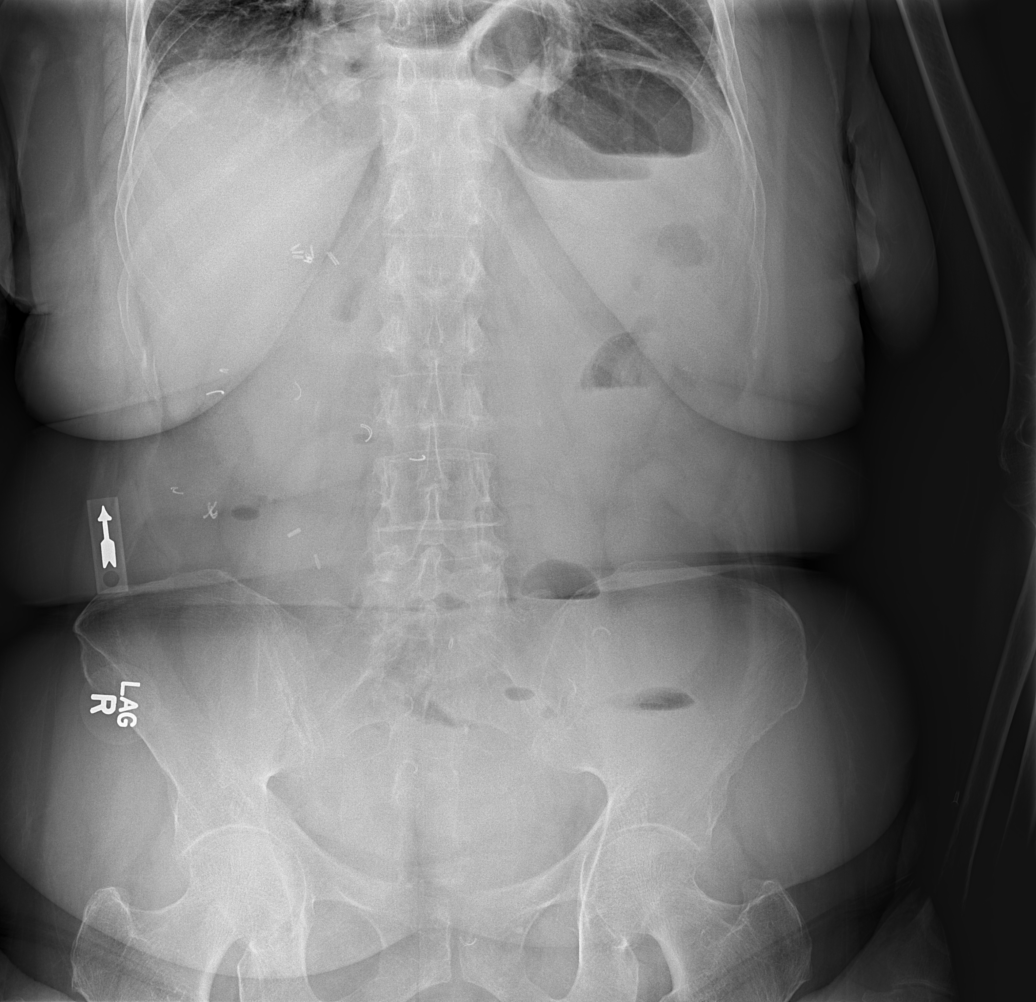

[t abdomen supine (1 of 2)]
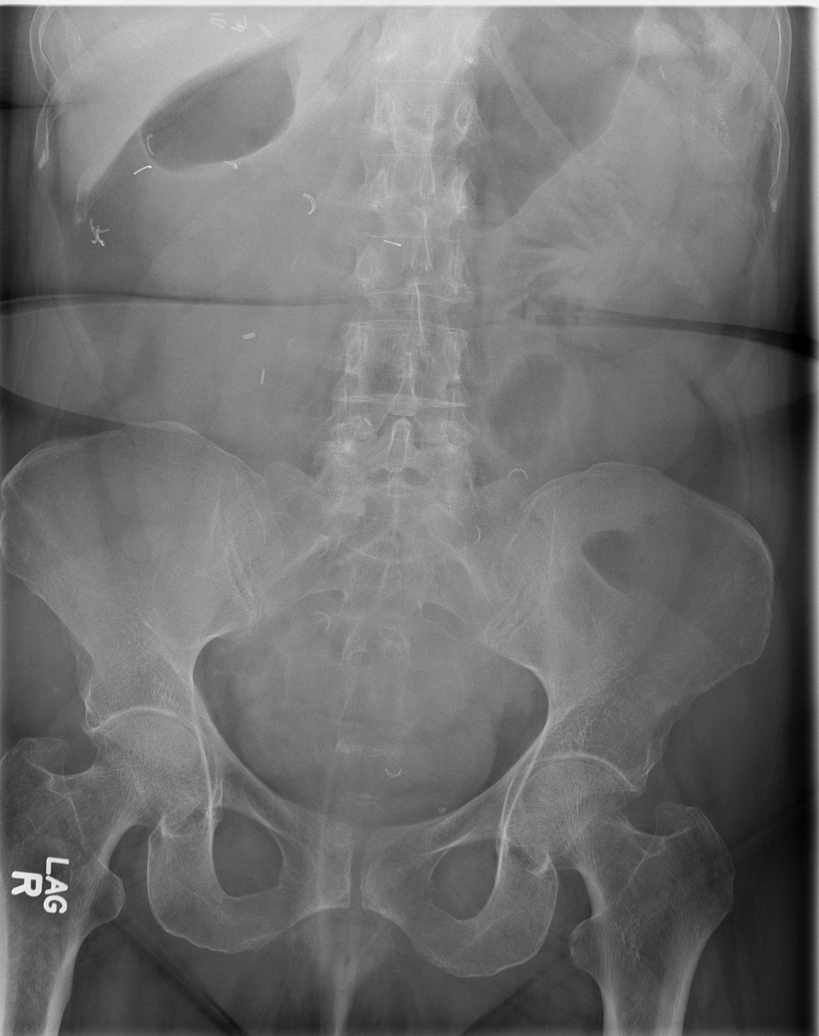

[t abdomen supine (2 of 2)]
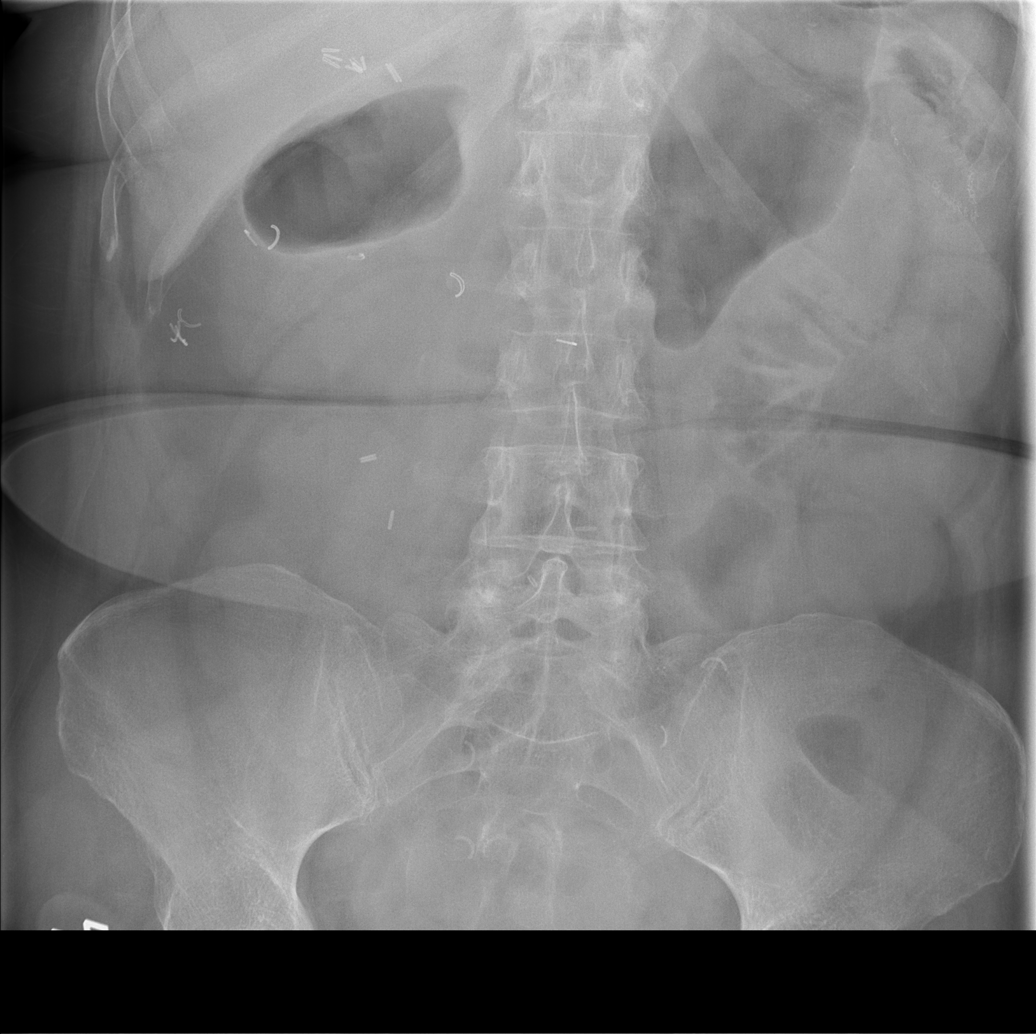

[4 of 4 positions shown; findings below may reference images not displayed]

FINDINGS: Hiatal hernia.  Surgical clips project over the trachea.
Mild bibasilar interstitial prominence / linear opacity may reflect
mild atelectasis.  Otherwise, without focal consolidation.

There are numerous surgical clips throughout the abdomen.  There
are a few nonspecific air-fluid levels.  Relative paucity of distal
bowel gas.  No free intraperitoneal air.  No acute osseous
abnormality.
IMPRESSION: Nonspecific bowel gas pattern with a few air-fluid levels however
no bowel dilatation.  No free intraperitoneal air.

Hiatal hernia.

## 2012-10-31 ENCOUNTER — Encounter (HOSPITAL_COMMUNITY): Payer: Medicare Other | Attending: Oncology

## 2012-10-31 VITALS — Wt 158.2 lb

## 2012-10-31 DIAGNOSIS — K5090 Crohn's disease, unspecified, without complications: Secondary | ICD-10-CM

## 2012-10-31 MED ORDER — SODIUM CHLORIDE 0.9 % IV SOLN
10.0000 mg/kg | Freq: Once | INTRAVENOUS | Status: AC
  Administered 2012-10-31: 700 mg via INTRAVENOUS
  Filled 2012-10-31: qty 70

## 2012-10-31 MED ORDER — LORATADINE 10 MG PO TABS
10.0000 mg | ORAL_TABLET | Freq: Every day | ORAL | Status: DC
  Administered 2012-10-31: 10 mg via ORAL

## 2012-10-31 MED ORDER — SODIUM CHLORIDE 0.9 % IV SOLN
INTRAVENOUS | Status: DC
  Administered 2012-10-31: 250 mL via INTRAVENOUS

## 2012-10-31 MED ORDER — ACETAMINOPHEN 325 MG PO TABS
650.0000 mg | ORAL_TABLET | Freq: Once | ORAL | Status: AC
  Administered 2012-10-31: 650 mg via ORAL

## 2012-10-31 NOTE — Progress Notes (Signed)
Tolerated well

## 2012-11-09 DIAGNOSIS — K5090 Crohn's disease, unspecified, without complications: Secondary | ICD-10-CM | POA: Diagnosis not present

## 2012-11-14 ENCOUNTER — Telehealth (INDEPENDENT_AMBULATORY_CARE_PROVIDER_SITE_OTHER): Payer: Self-pay | Admitting: *Deleted

## 2012-11-14 NOTE — Telephone Encounter (Signed)
Aleiya LM asking for [name] to please give her a return call before the end of the day. The numbers she can be reached on are [phone] and  [phone].

## 2012-11-21 ENCOUNTER — Other Ambulatory Visit (INDEPENDENT_AMBULATORY_CARE_PROVIDER_SITE_OTHER): Payer: Self-pay | Admitting: Internal Medicine

## 2012-11-21 ENCOUNTER — Ambulatory Visit (INDEPENDENT_AMBULATORY_CARE_PROVIDER_SITE_OTHER): Payer: Medicare Other | Admitting: Internal Medicine

## 2012-11-22 ENCOUNTER — Telehealth (INDEPENDENT_AMBULATORY_CARE_PROVIDER_SITE_OTHER): Payer: Self-pay | Admitting: *Deleted

## 2012-11-22 NOTE — Telephone Encounter (Signed)
Danielle [name] called and ask that we refill her Phenergan. Per Dr.Rehman may refill : Phenergan 25 mg take 1 by mouth every 6 hours as needed for nausea #30 no refills This was [facility] to [facility] and spoke with the pharmacist Katie. Danielle [name] was called and made aware

## 2012-11-22 NOTE — Telephone Encounter (Signed)
Patient was called and medication called in for her,refer to telephone encounter 11/22/12

## 2012-11-24 IMAGING — RF DG ESOPHAGUS
9 of 14 series · 15 of 24 positions shown · non-contrast
Comparison: None.

CLINICAL DATA: Large hiatal hernia on endoscopy.

ESOPHOGRAM/BARIUM SWALLOW
TECHNIQUE: Combined double contrast and single contrast
examination performed using effervescent crystals, thick barium
liquid, and thin barium liquid.
Fluoroscopy time:  1.0 minutes.

[Series 1: run · 4 of 8 slices shown (1 of 9)]
[im 1/8]
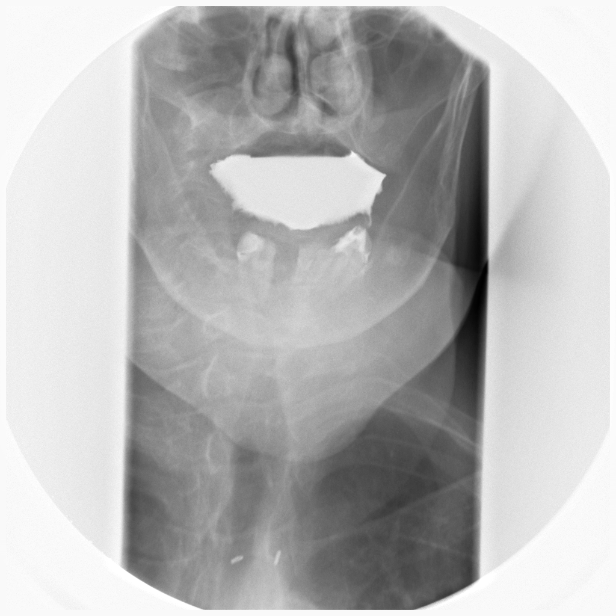
[im 3/8]
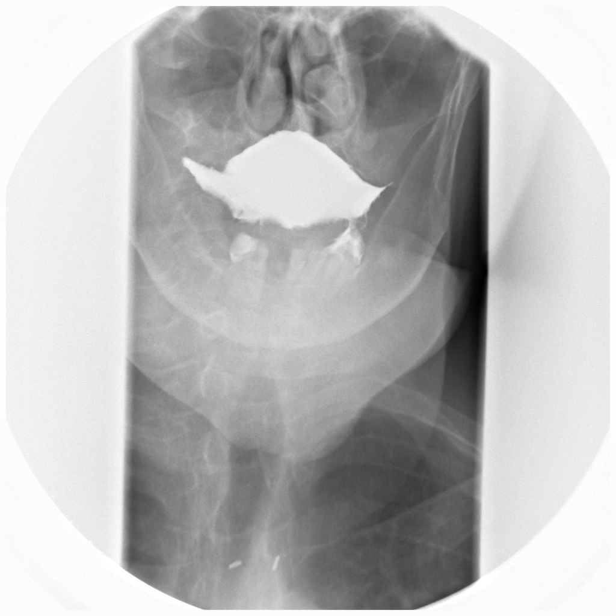
[im 6/8]
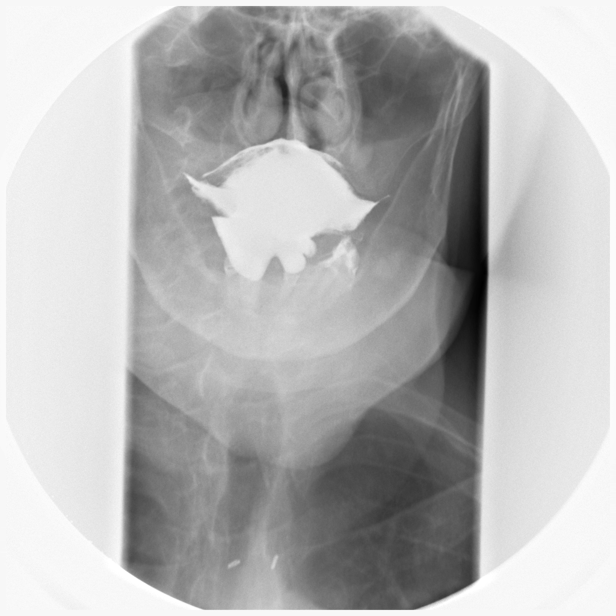
[im 8/8]
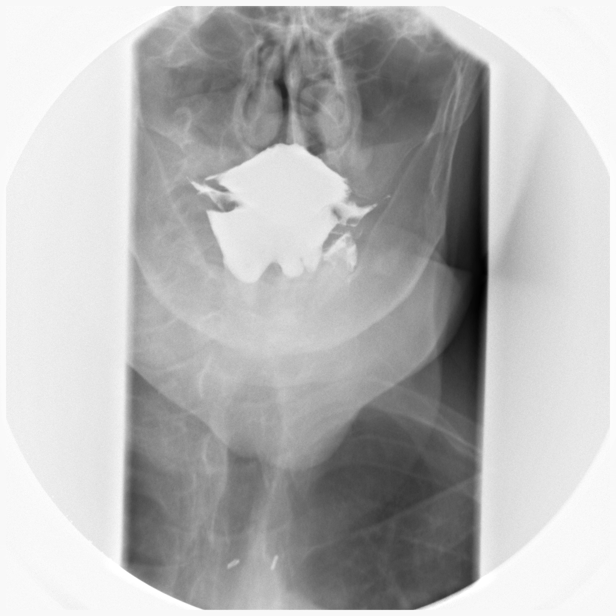

[Series 3: run · 4 of 9 slices shown (2 of 9)]
[im 1/9]
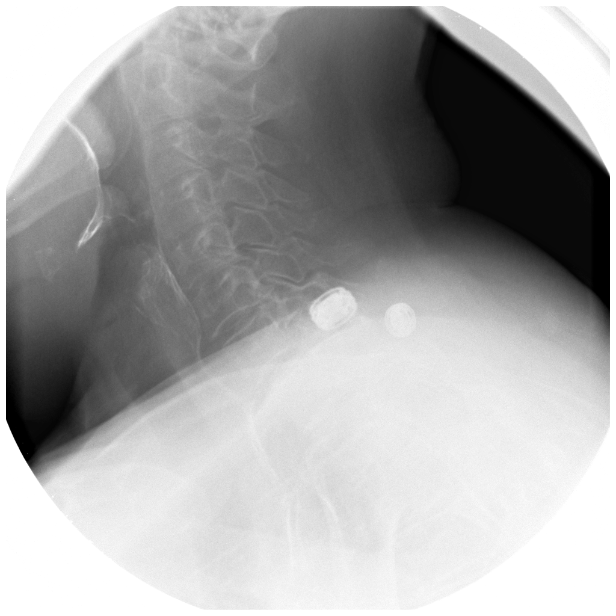
[im 2/9]
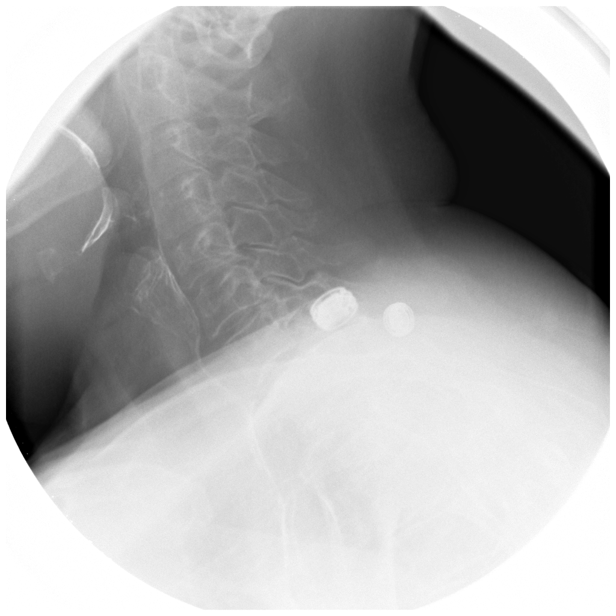
[im 5/9]
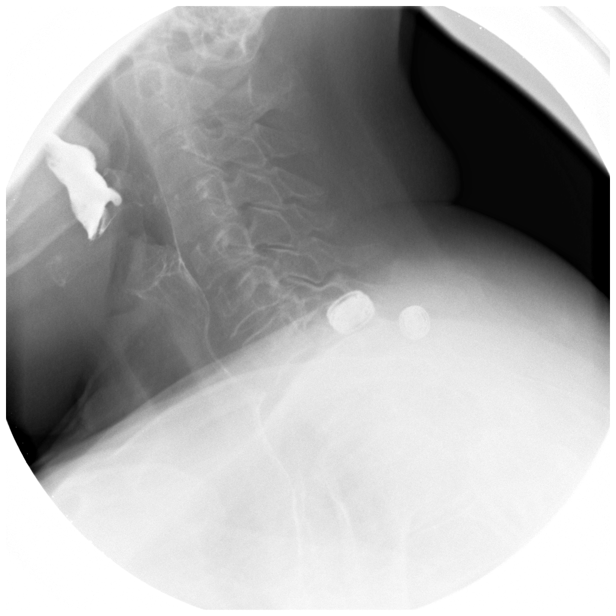
[im 9/9]
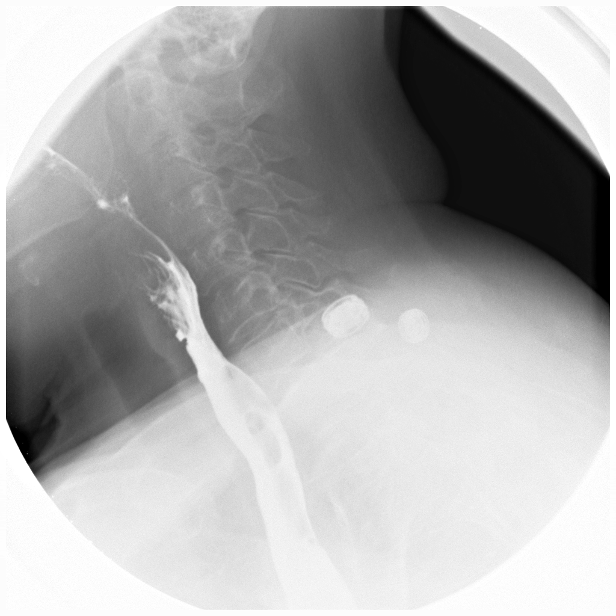

[Series 4: run · 1 of 1 slices shown (3 of 9)]
[im 1/1]
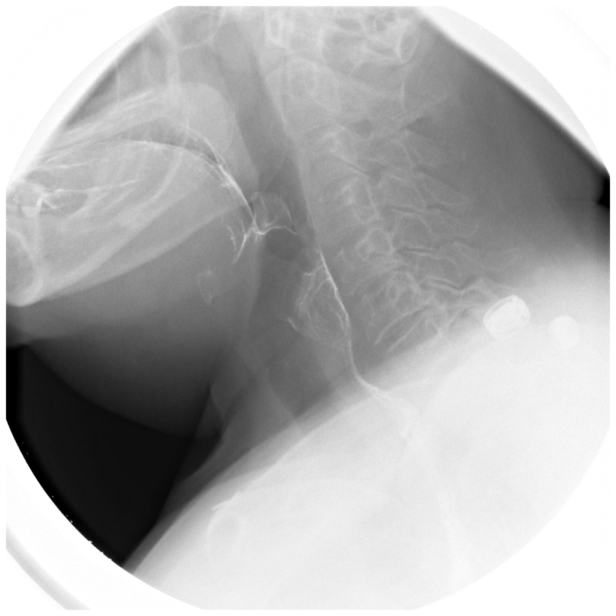

[Series 6: run · 1 of 1 slices shown (4 of 9)]
[im 1/1]
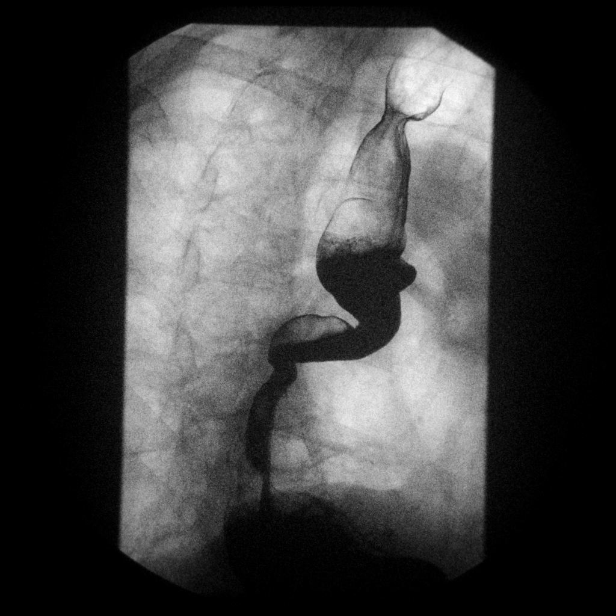

[Series 7: run · 1 of 1 slices shown (5 of 9)]
[im 1/1]
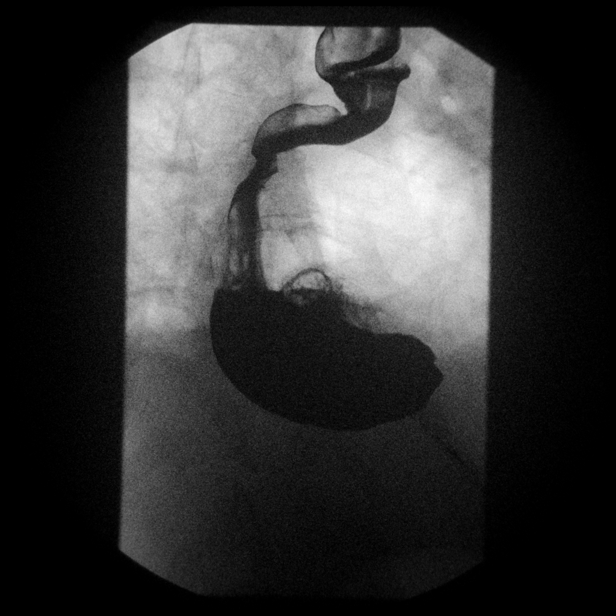

[Series 9: run · 1 of 1 slices shown (6 of 9)]
[im 1/1]
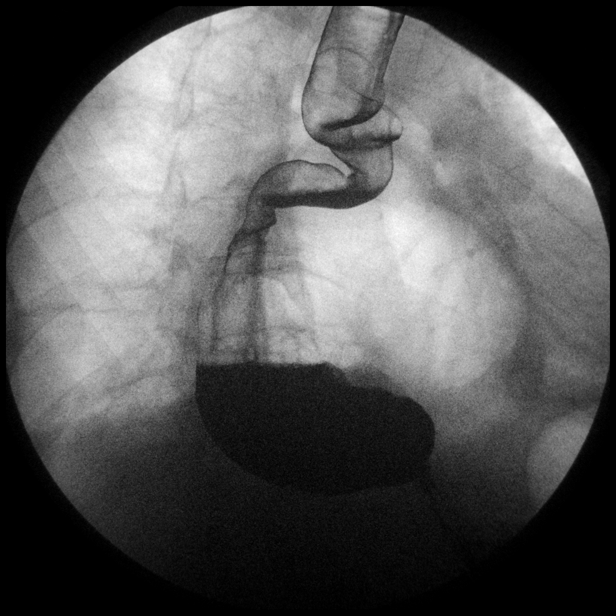

[Series 11: run · 1 of 1 slices shown (7 of 9)]
[im 1/1]
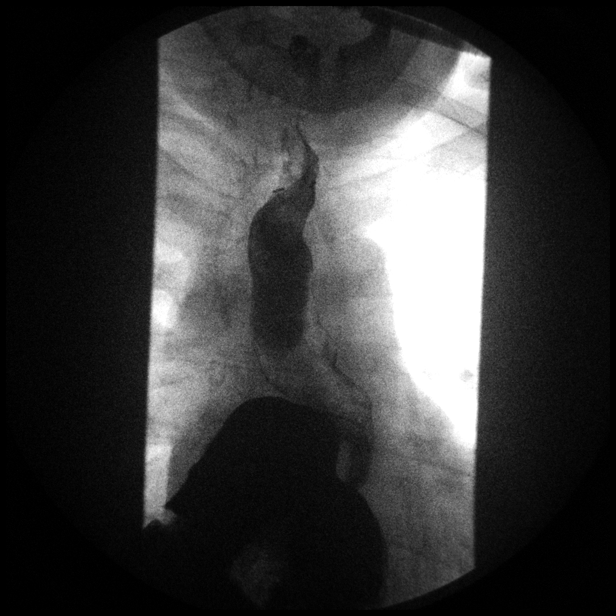

[Series 12: run · 1 of 1 slices shown (8 of 9)]
[im 1/1]
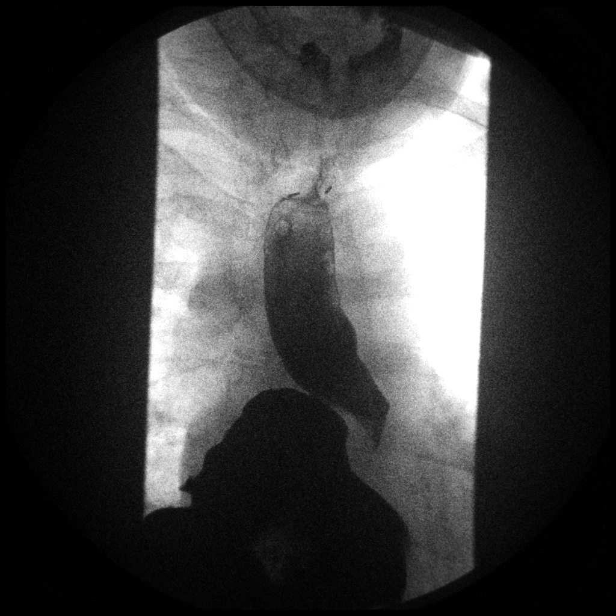

[Series 14: run · 1 of 1 slices shown (9 of 9)]
[im 1/1]
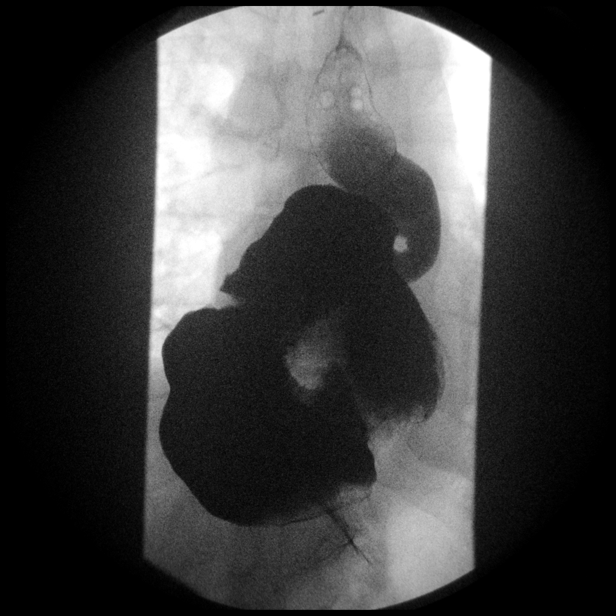

[15 of 24 positions shown; findings below may reference images not displayed]

FINDINGS: There is pre swallow spillover to the vallecula, with
difficulty initiating swallowing.  There are primary peristaltic
waves during single swallow evaluation.  Remainder of the exam was
limited due to severe patient discomfort.  There is a large hiatal
hernia.
IMPRESSION: Exam was limited due to patient condition.  Large hiatal hernia.

## 2012-12-06 ENCOUNTER — Encounter (HOSPITAL_COMMUNITY): Payer: Self-pay | Admitting: *Deleted

## 2012-12-06 ENCOUNTER — Emergency Department (HOSPITAL_COMMUNITY): Payer: Medicare Other

## 2012-12-06 DIAGNOSIS — Z87891 Personal history of nicotine dependence: Secondary | ICD-10-CM | POA: Insufficient documentation

## 2012-12-06 DIAGNOSIS — Z862 Personal history of diseases of the blood and blood-forming organs and certain disorders involving the immune mechanism: Secondary | ICD-10-CM | POA: Insufficient documentation

## 2012-12-06 DIAGNOSIS — D72829 Elevated white blood cell count, unspecified: Secondary | ICD-10-CM | POA: Diagnosis not present

## 2012-12-06 DIAGNOSIS — R112 Nausea with vomiting, unspecified: Secondary | ICD-10-CM | POA: Diagnosis not present

## 2012-12-06 DIAGNOSIS — Z8719 Personal history of other diseases of the digestive system: Secondary | ICD-10-CM | POA: Insufficient documentation

## 2012-12-06 DIAGNOSIS — J189 Pneumonia, unspecified organism: Secondary | ICD-10-CM | POA: Insufficient documentation

## 2012-12-06 DIAGNOSIS — K5090 Crohn's disease, unspecified, without complications: Secondary | ICD-10-CM | POA: Diagnosis not present

## 2012-12-06 DIAGNOSIS — E876 Hypokalemia: Secondary | ICD-10-CM | POA: Insufficient documentation

## 2012-12-06 DIAGNOSIS — Z8679 Personal history of other diseases of the circulatory system: Secondary | ICD-10-CM | POA: Insufficient documentation

## 2012-12-06 DIAGNOSIS — Z8701 Personal history of pneumonia (recurrent): Secondary | ICD-10-CM | POA: Insufficient documentation

## 2012-12-06 DIAGNOSIS — Z79899 Other long term (current) drug therapy: Secondary | ICD-10-CM | POA: Diagnosis not present

## 2012-12-06 DIAGNOSIS — K219 Gastro-esophageal reflux disease without esophagitis: Secondary | ICD-10-CM | POA: Insufficient documentation

## 2012-12-06 DIAGNOSIS — M2550 Pain in unspecified joint: Secondary | ICD-10-CM | POA: Insufficient documentation

## 2012-12-06 DIAGNOSIS — R0602 Shortness of breath: Secondary | ICD-10-CM | POA: Diagnosis not present

## 2012-12-06 DIAGNOSIS — R079 Chest pain, unspecified: Secondary | ICD-10-CM | POA: Diagnosis not present

## 2012-12-06 DIAGNOSIS — R071 Chest pain on breathing: Secondary | ICD-10-CM | POA: Diagnosis not present

## 2012-12-06 LAB — CBC WITH DIFFERENTIAL/PLATELET
Basophils Absolute: 0 10*3/uL (ref 0.0–0.1)
Basophils Relative: 0 % (ref 0–1)
Eosinophils Absolute: 0.1 10*3/uL (ref 0.0–0.7)
Eosinophils Relative: 0 % (ref 0–5)
MCH: 27.1 pg (ref 26.0–34.0)
MCHC: 32.3 g/dL (ref 30.0–36.0)
MCV: 83.9 fL (ref 78.0–100.0)
Platelets: 323 10*3/uL (ref 150–400)
RDW: 13.6 % (ref 11.5–15.5)

## 2012-12-06 LAB — POCT I-STAT TROPONIN I: Troponin i, poc: 0.01 ng/mL (ref 0.00–0.08)

## 2012-12-06 NOTE — ED Notes (Addendum)
Here by EMS, here from home for R CP, reports nv all night last night, developed R CP today, pain on R side, radiates to R shoulder and RU back, pain worse with movement, "not sure if she pulled a muscle vomiting" per EMS, (denies: sob, nv, dizziness or other sx), allergic to ASA, EMS unable to establish IV site. "EKG unremarkable". H/o crohns dz. Alert, NAD, calm, interactive. To triage via stretcher. No pedal edema, LS CTA.

## 2012-12-07 ENCOUNTER — Emergency Department (HOSPITAL_COMMUNITY)
Admission: EM | Admit: 2012-12-07 | Discharge: 2012-12-07 | Disposition: A | Discharge status: Home / Self Care | Payer: Medicare Other | Attending: Emergency Medicine | Admitting: Emergency Medicine

## 2012-12-07 DIAGNOSIS — J189 Pneumonia, unspecified organism: Secondary | ICD-10-CM

## 2012-12-07 DIAGNOSIS — E876 Hypokalemia: Secondary | ICD-10-CM

## 2012-12-07 DIAGNOSIS — R0789 Other chest pain: Secondary | ICD-10-CM

## 2012-12-07 LAB — COMPREHENSIVE METABOLIC PANEL
AST: 21 U/L (ref 0–37)
Albumin: 3.3 g/dL — ABNORMAL LOW (ref 3.5–5.2)
Alkaline Phosphatase: 108 U/L (ref 39–117)
BUN: 15 mg/dL (ref 6–23)
CO2: 17 mEq/L — ABNORMAL LOW (ref 19–32)
Chloride: 100 mEq/L (ref 96–112)
Potassium: 3.1 mEq/L — ABNORMAL LOW (ref 3.5–5.1)
Total Bilirubin: 0.6 mg/dL (ref 0.3–1.2)

## 2012-12-07 MED ORDER — AZITHROMYCIN 250 MG PO TABS: 250.0000 mg | ORAL_TABLET | Freq: Every day | ORAL | Status: DC

## 2012-12-07 MED ORDER — OXYCODONE-ACETAMINOPHEN 5-325 MG PO TABS: 2.0000 | ORAL_TABLET | ORAL | Status: DC | PRN

## 2012-12-07 MED ORDER — HYDROMORPHONE HCL PF 1 MG/ML IJ SOLN
1.0000 mg | Freq: Once | INTRAMUSCULAR | Status: AC
  Administered 2012-12-07: 1 mg via INTRAMUSCULAR
  Filled 2012-12-07: qty 1

## 2012-12-07 MED ORDER — AZITHROMYCIN 250 MG PO TABS
500.0000 mg | ORAL_TABLET | Freq: Once | ORAL | Status: AC
  Administered 2012-12-07: 500 mg via ORAL
  Filled 2012-12-07: qty 2

## 2012-12-07 NOTE — ED Provider Notes (Signed)
History     CSN: [id number]  Arrival date & time 12/06/12  2245   First MD Initiated Contact with Patient 12/07/12 0123      Chief Complaint  Patient presents with  . Nausea  . Emesis  . Chest Pain    (Consider location/radiation/quality/duration/timing/severity/associated sxs/prior treatment) HPI 67 year old female presents to emergency room complaining of right-sided chest wall pain. She reports pain started on Monday after having episode of nausea and vomiting. Patient has history of Crohn's, reports she ate some greens which she felt upset her stomach. She denies any abdominal pain She denies any fever, chills or cough, she has not had shortness of breath. Patient reports she had multiple episodes of vomiting on Monday. She has not had any vomiting on Tuesday, but has had worsening chest wall pain. She has pain from her right shoulder down to the bottom of her costal margin. Pain is worsened with movement and with palpation of the area. She denies previous history of similar symptoms. Past Medical History  Diagnosis Date  . Crohn's disease   . Chronic diarrhea   . Joint pain   . GERD (gastroesophageal reflux disease)   . Polyarthralgia   . Shortness of breath   . Pneumonia   . Anemia   . Hernia (acquired) (recurrent)     hiatal  . Hiatal hernia   . Back pain   . Hemorrhoids     Past Surgical History  Procedure Date  . Bowel resection   . Colostomy     [name]  . Anal stenosis   . Colonoscopy 2000    needs to have one  . Cholecystectomy   . Appendectomy   . Goiter 1999  . Esophagogastroduodenoscopy 11/11/2011    Procedure: ESOPHAGOGASTRODUODENOSCOPY (EGD);  Surgeon: Barrie Folk, MD;  Location: [facility] ENDOSCOPY;  Service: Endoscopy;  Laterality: N/A;  . Flexible sigmoidoscopy 11/11/2011    Procedure: FLEXIBLE SIGMOIDOSCOPY;  Surgeon: Barrie Folk, MD;  Location: [facility] ENDOSCOPY;  Service: Endoscopy;  Laterality: N/A;  . Esophagogastroduodenoscopy 05/18/2012    Procedure:  ESOPHAGOGASTRODUODENOSCOPY (EGD);  Surgeon: Malissa Hippo, MD;  Location: [facility] ENDO SUITE;  Service: Endoscopy;  Laterality: N/A;  200    Family History  Problem Relation Age of Onset  . Diabetes Mother   . Healthy Sister   . Hypertension Brother   . Colon cancer Brother     died with colon cancer    History  Substance Use Topics  . Smoking status: Former Smoker -- 0.2 packs/day for 4 years    Types: Cigarettes    Quit date: 10/05/1996  . Smokeless tobacco: Never Used     Comment: 1 pack every two weeks when she smoked  . Alcohol Use: No     Comment: denies    OB History    Grav Para Term Preterm Abortions TAB SAB Ect Mult Living                  Review of Systems  See History of Present Illness; otherwise all other systems are reviewed and negative  Allergies  Aspirin and Sulfa antibiotics  Home Medications   Current Outpatient Rx  Name  Route  Sig  Dispense  Refill  . ALIGN PO CAPS   Oral   Take 1 capsule by mouth daily.   30 capsule   5   . CALCIUM-VITAMIN D 600-200 MG-UNIT PO TABS   Oral   Take 1 tablet by mouth daily. Do not resume this medication  until it has been cleared with your Gastroenterologist.   30 tablet   3   . VITAMIN B-12 IJ   Injection   Inject as directed every 30 (thirty) days.           [name] DICYCLOMINE HCL 10 MG/5ML PO SOLN   Oral   Take 15 mg by mouth 3 (three) times daily as needed.          . INFLIXIMAB 100 MG IV SOLR   Intravenous   Inject 100 mg into the vein every 6 (six) weeks. Discuss with your gastroenterologist before resuming remicade.   1 each   0   . METRONIDAZOLE 500 MG PO TABS      TAKE 1 TABLET (500 MG TOTAL) BY MOUTH 2 (TWO) TIMES DAILY WITH A MEAL.   30 tablet   0   . MULTIVITAMINS PO TABS   Oral   Take 1 tablet by mouth daily.   30 tablet   3   . NYSTATIN-TRIAMCINOLONE 100000-0.1 UNIT/GM-% EX CREA      APPLY TO AFFECTED AREA TWICE A DAY   30 g   1     WZR   . PANTOPRAZOLE SODIUM 40 MG PO  TBEC   Oral   Take 1 tablet (40 mg total) by mouth daily.   30 tablet   5   . PROMETHAZINE HCL 25 MG PO TABS   Oral   Take 1 tablet (25 mg total) by mouth every 6 (six) hours as needed for nausea.   60 tablet   3   . SPIRONOLACTONE 50 MG PO TABS      TAKE 1 TABLET (50 MG TOTAL) BY MOUTH DAILY.   30 tablet   2   . SPIRONOLACTONE 50 MG PO TABS      TAKE 1 TABLET (50 MG TOTAL) BY MOUTH DAILY.   30 tablet   3   . ZOLPIDEM TARTRATE 10 MG PO TABS   Oral   Take 1 tablet (10 mg total) by mouth as needed. insomnia   30 tablet   0     BP 122/68  Pulse 112  Temp 99.4 F (37.4 C) (Oral)  Resp 20  SpO2 100%  Physical Exam  Nursing note and vitals reviewed. Constitutional: She is oriented to person, place, and time. She appears well-developed and well-nourished.  HENT:  Head: Normocephalic and atraumatic.  Right Ear: External ear normal.  Left Ear: External ear normal.  Nose: Nose normal.  Mouth/Throat: Oropharynx is clear and moist.  Eyes: Conjunctivae normal and EOM are normal. Pupils are equal, round, and reactive to light.  Neck: Normal range of motion. Neck supple. No JVD present. No tracheal deviation present. No thyromegaly present.  Cardiovascular: Normal rate, regular rhythm, normal heart sounds and intact distal pulses.  Exam reveals no gallop and no friction rub.   No murmur heard. Pulmonary/Chest: Effort normal. No stridor. No respiratory distress. She has no wheezes. She has no rales. She exhibits tenderness (significant chest wall tenderness from shoulder over anterior and lateral chest wall. No rashes noted, no deformity noted).       Diminished breath sounds on the right  Abdominal: Soft. Bowel sounds are normal. She exhibits no distension and no mass. There is no tenderness. There is no rebound and no guarding.  Musculoskeletal: Normal range of motion. She exhibits no edema and no tenderness.  Lymphadenopathy:    She has no cervical adenopathy.    Neurological: She is alert and  oriented to person, place, and time. She exhibits normal muscle tone. Coordination normal.  Skin: Skin is warm and dry. No rash noted. No erythema. No pallor.  Psychiatric: She has a normal mood and affect. Her behavior is normal. Judgment and thought content normal.    ED Course  Procedures (including critical care time)  Labs Reviewed  COMPREHENSIVE METABOLIC PANEL - Abnormal; Notable for the following:    Sodium 134 (*)     Potassium 3.1 (*)     CO2 17 (*)     Creatinine, Ser 1.28 (*)     Total Protein 8.6 (*)     Albumin 3.3 (*)     GFR calc non Af Amer 43 (*)     GFR calc Af Amer 50 (*)     All other components within normal limits  CBC WITH DIFFERENTIAL - Abnormal; Notable for the following:    WBC 16.1 (*)     Hemoglobin 11.1 (*)     HCT 34.4 (*)     Neutro Abs 11.9 (*)     Monocytes Absolute 1.2 (*)     All other components within normal limits  POCT I-STAT TROPONIN I   Dg Chest 2 View  12/07/2012  *RADIOLOGY REPORT*  Clinical Data: Nausea and vomiting; right lateral chest pain.  CHEST - 2 VIEW  Comparison: Chest radiograph performed 11/06/2011  Findings: The lungs are well-aerated.  Bibasilar airspace opacification is noted, worse on the right.  This raises concern for pneumonia; aspiration cannot be entirely excluded.  No pleural effusion or pneumothorax is seen.  The heart is borderline normal in size.  A somewhat complex large hiatal hernia is again noted, filled with air.  No acute osseous abnormalities are seen.  IMPRESSION:  1.  Bibasilar airspace opacification, worse on the right.  This raises concern for pneumonia; aspiration cannot be entirely excluded. 2.  Somewhat complex large hiatal hernia again noted, filled with air.   Original Report Authenticated By: Tonia Ghent, M.D.      1. Community acquired pneumonia   2. Chest wall pain   3. Hypokalemia       MDM  67 year old female with right-sided chest wall tenderness after  episodes of vomiting on Monday, 2 days ago. She is noted to have elevated white blood cell count, she is not on steroids. Her chest x-ray shows a pneumonia on the right. Will treat as it to be acquired pneumonia. Location of this pneumonia on her diaphragm may have triggered the vomiting. She has a primary care Dr. that she will see on Friday for followup.        Olivia Mackie, MD 12/07/12 [phone]

## 2012-12-08 ENCOUNTER — Other Ambulatory Visit (INDEPENDENT_AMBULATORY_CARE_PROVIDER_SITE_OTHER): Payer: Self-pay | Admitting: *Deleted

## 2012-12-08 NOTE — Telephone Encounter (Signed)
Patient left a message on the telephone asking that her Phenergan be refilled. She started throwing up really bad,aspirated and now has pneumonia. Remain very nauseated. Per Delrae Rend may call in Phenergan 25 mg Take 1 by mouth every 6 hours as needed for Nausea #30 no refills [facility] Patient was called and made aware

## 2012-12-12 ENCOUNTER — Ambulatory Visit (HOSPITAL_COMMUNITY): Payer: Medicare Other

## 2012-12-21 ENCOUNTER — Telehealth (INDEPENDENT_AMBULATORY_CARE_PROVIDER_SITE_OTHER): Payer: Self-pay | Admitting: Internal Medicine

## 2012-12-21 ENCOUNTER — Other Ambulatory Visit (INDEPENDENT_AMBULATORY_CARE_PROVIDER_SITE_OTHER): Payer: Self-pay | Admitting: Internal Medicine

## 2012-12-21 MED ORDER — PROMETHAZINE HCL 25 MG PO TABS: 25.0000 mg | ORAL_TABLET | Freq: Four times a day (QID) | ORAL | Status: DC | PRN

## 2012-12-21 NOTE — Telephone Encounter (Signed)
Rx for phenergan 25mg  q 6 hr as needed e prescribed to her pharmacy  #60 with 3 refills.

## 2012-12-23 IMAGING — CR DG ABDOMEN ACUTE W/ 1V CHEST
4 series · 4 of 4 positions shown · non-contrast
Comparison: 10/13/2011 abdominal radiographs, 11/06/2011 chest
radiographs

CLINICAL DATA: Abdominal pain and distention, nausea, vomiting,
acid reflux, chest discomfort, shortness of breath; history Crohn's
disease, GERD

ACUTE ABDOMEN SERIES (ABDOMEN 2 VIEW & CHEST 1 VIEW)

[x chest ap]
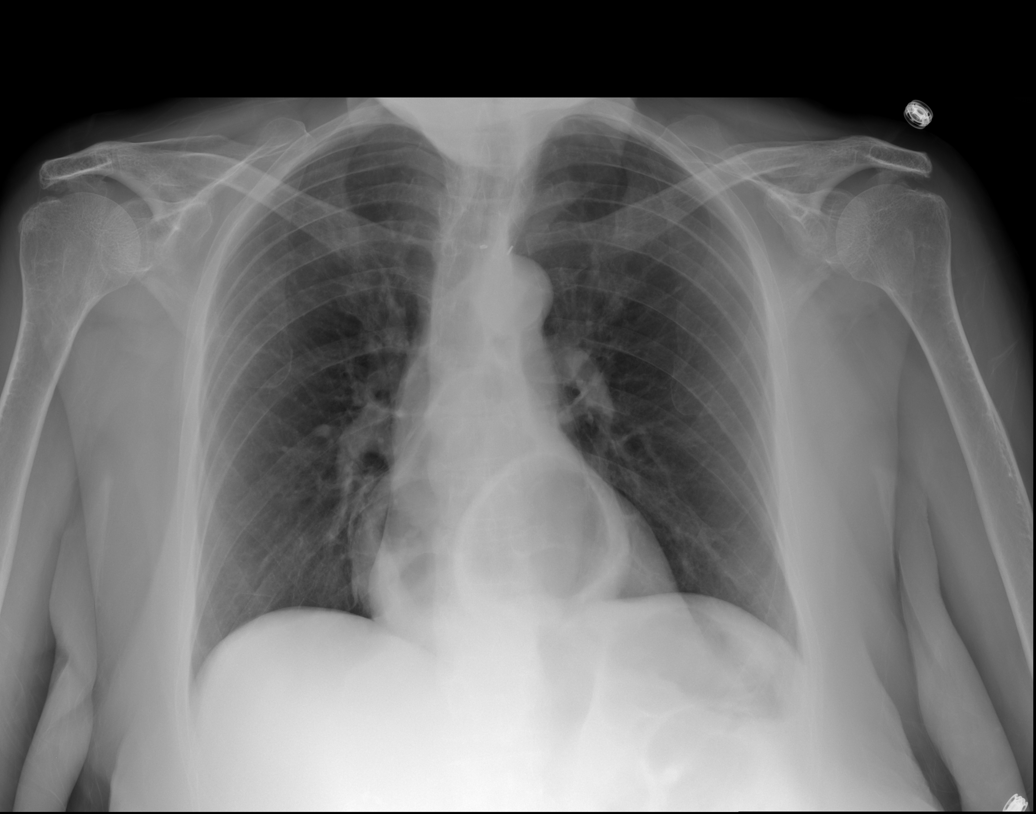

[w abdomen upright]
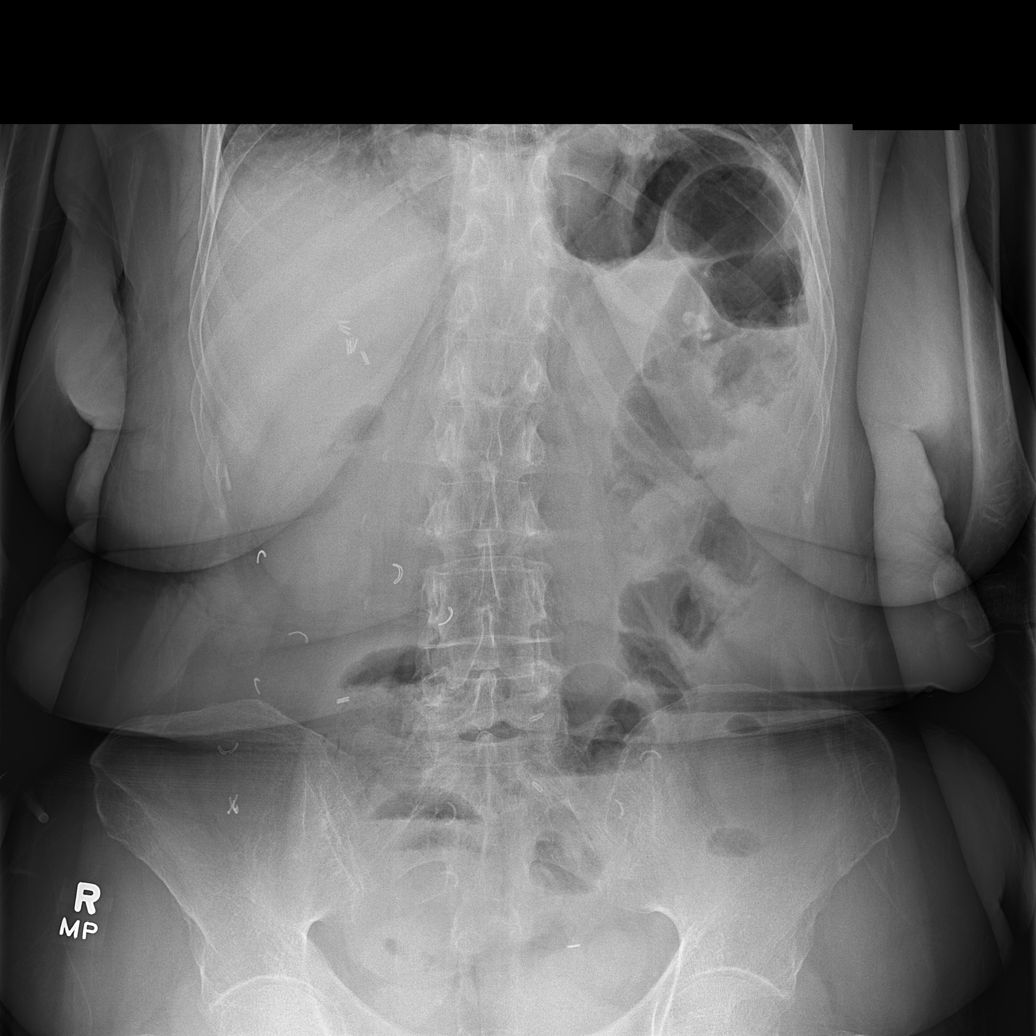

[t abdomen supine (1 of 2)]
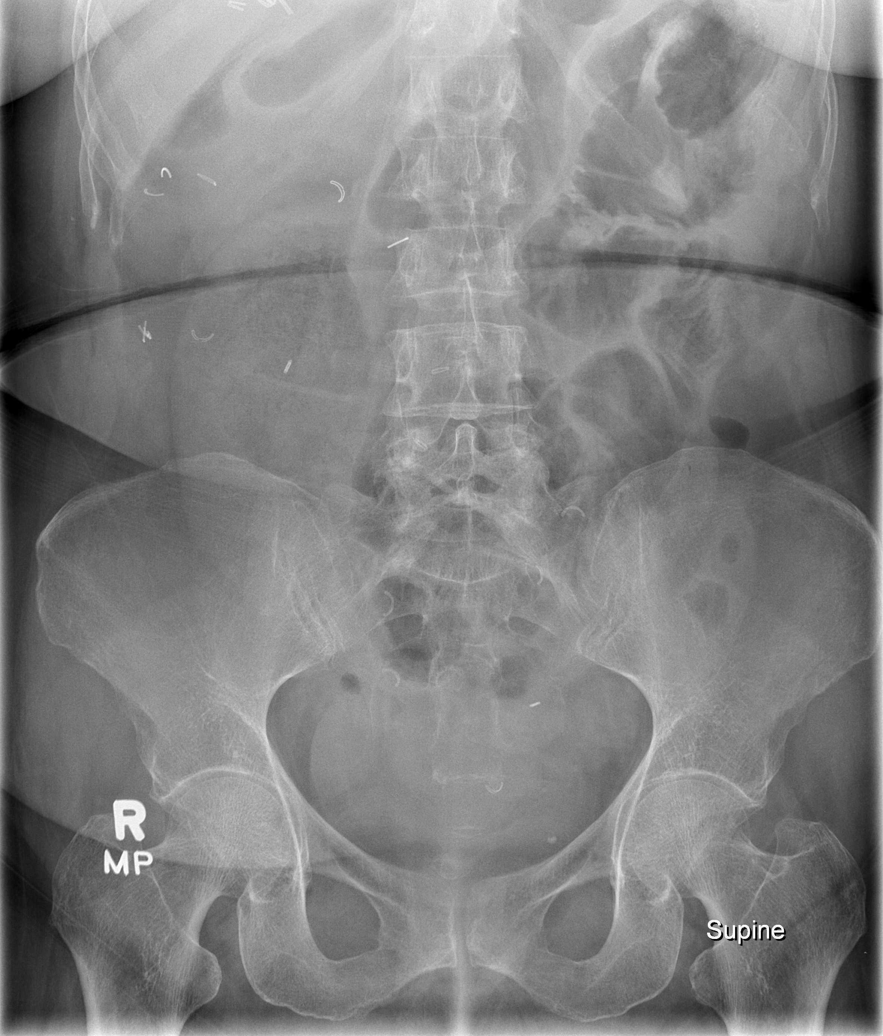

[t abdomen supine (2 of 2)]
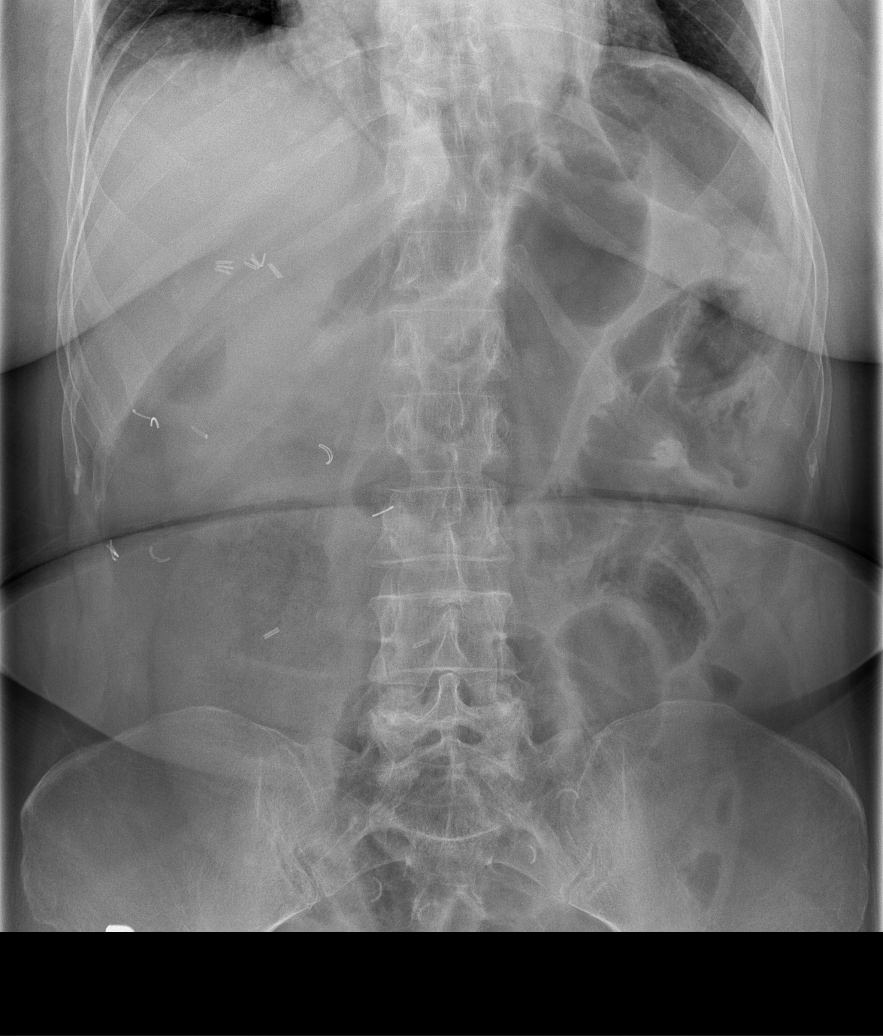

[4 of 4 positions shown; findings below may reference images not displayed]

FINDINGS: Normal heart size and pulmonary vascularity.
Mildly tortuous thoracic aorta.
Moderate to large hiatal hernia.
Lungs clear.
No pleural effusion or pneumothorax.
Bones appear demineralized.
Air filled loops of bowel under left diaphragm similar to prior
study.
Scattered surgical clips in abdomen.
Paucity of colonic gas.
Few prominent small bowel loops in mid abdomen with air-fluid
levels on upright view.
Small amount gas within stomach.
Bones diffusely demineralized.
No definite bowel wall thickening or free intraperitoneal air.
No urinary tract calcification.
IMPRESSION: Hiatal hernia.
Few mildly prominent small bowel loops in the abdomen, with a few
scattered air-fluid levels on upright view of paucity of colonic
gas.
Partial or early small bowel obstruction not excluded.
Consider follow-up CT imaging of the abdomen pelvis with IV and
oral contrast if clinically indicated.

## 2012-12-23 IMAGING — CT CT ABD-PELV W/ CM
2 of 5 series · 15 of 46 positions shown, 17 images · IV contrast (APPLIED)
Comparison: 04/02/2011

CLINICAL DATA: Vomiting and diarrhea.  History of Crohn's.
Gastroesophageal reflux disease.  Hernia surgery.  Bowel resection.
Cholecystectomy and appendectomy.

CT ABDOMEN AND PELVIS WITH CONTRAST
TECHNIQUE: Multidetector CT imaging of the abdomen and pelvis was
performed following the standard protocol during bolus
administration of intravenous contrast.
Contrast: 80mL OMNIPAQUE IOHEXOL 300 MG/ML IV SOLN

[Series 2: abd/pel with · axial · 0.74mm/px · z∈[-310,+44]mm · 12 of 81 slices shown, 14 images]
[im 5/81  soft-tissue]
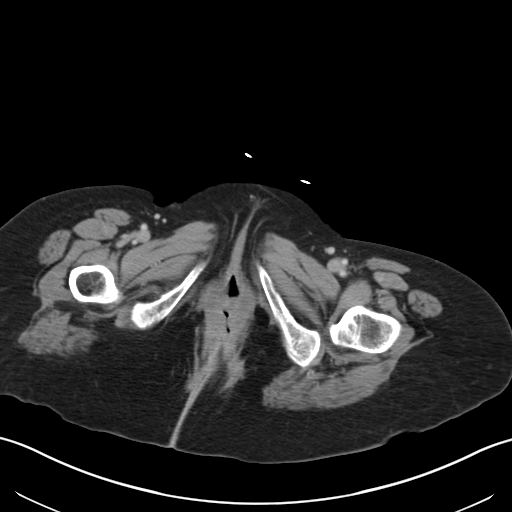
[im 5/81  bone]
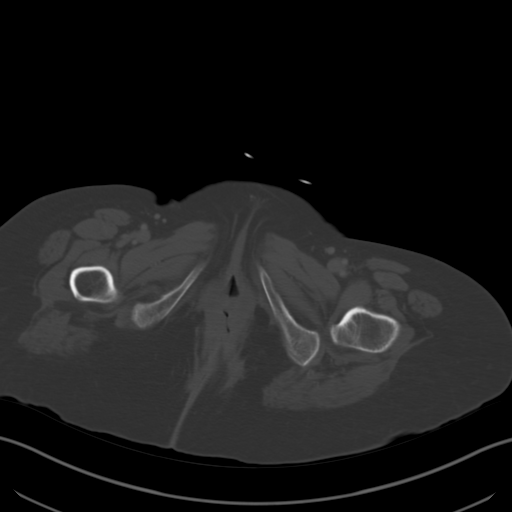
[im 14/81  soft-tissue]
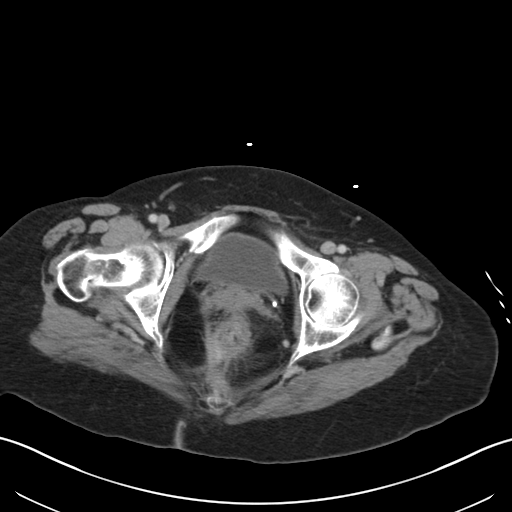
[im 18/81  soft-tissue]
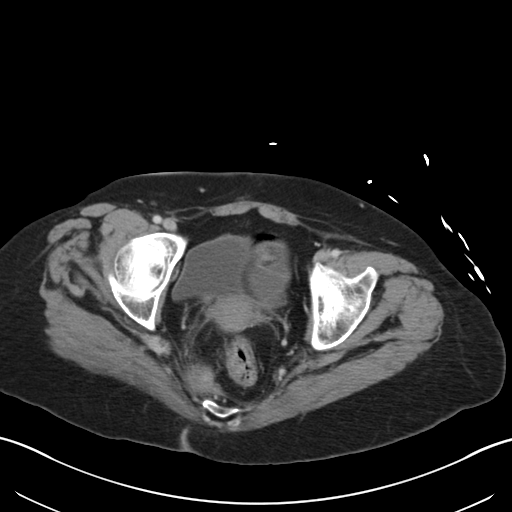
[im 23/81  soft-tissue]
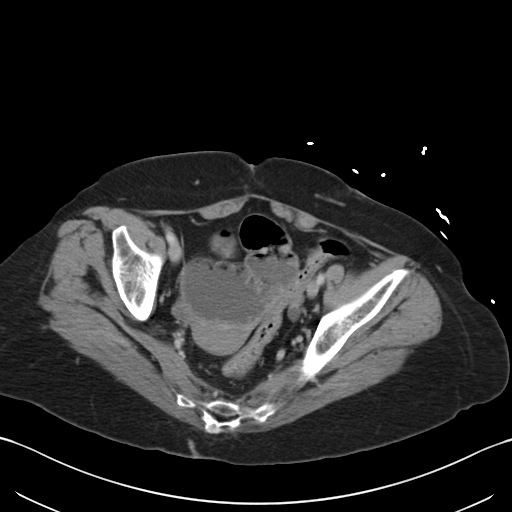
[im 32/81  soft-tissue]
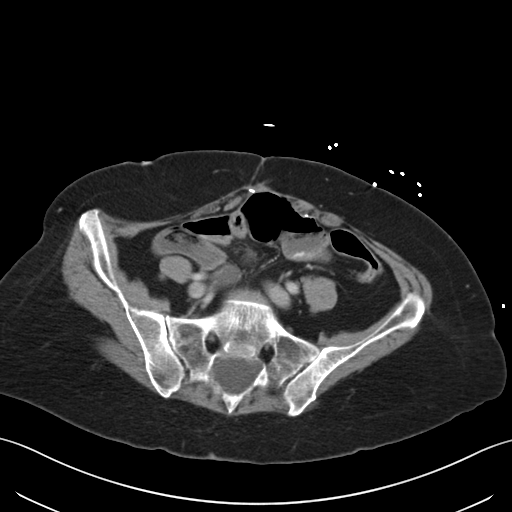
[im 36/81  soft-tissue]
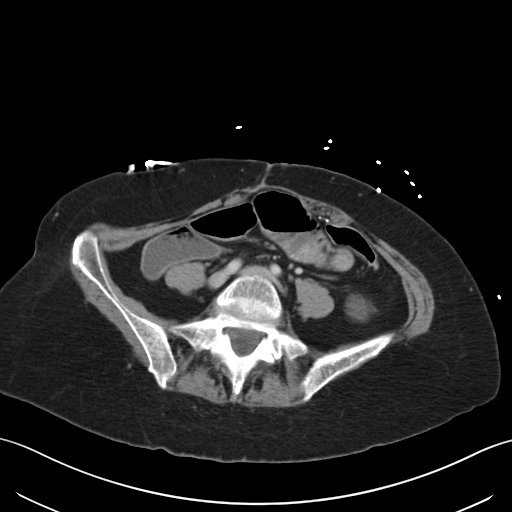
[im 45/81  soft-tissue]
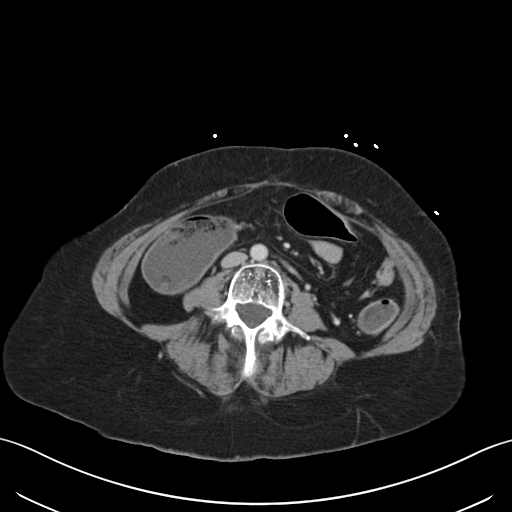
[im 49/81  soft-tissue]
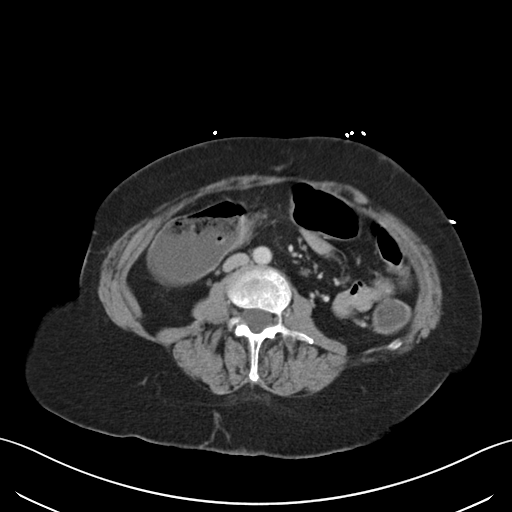
[im 58/81  soft-tissue]
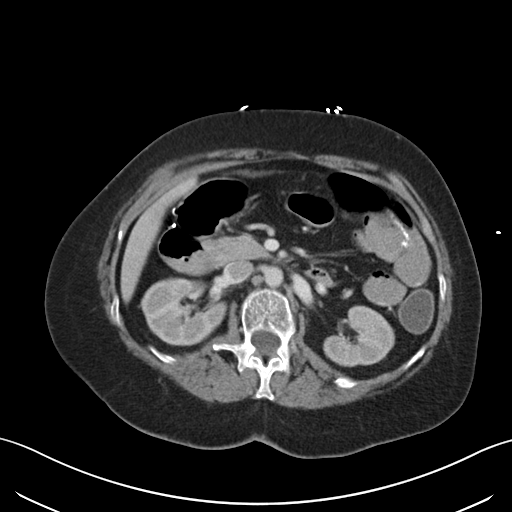
[im 58/81  bone]
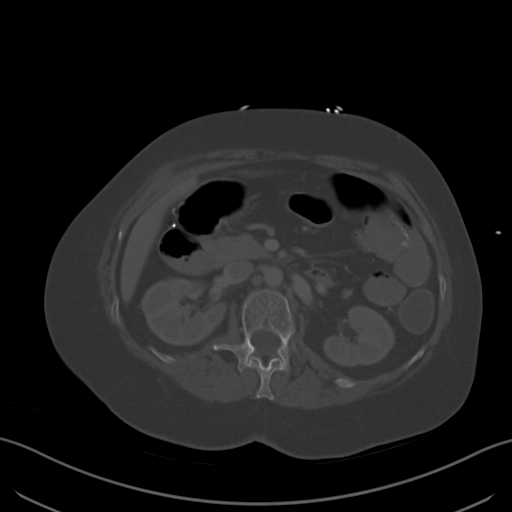
[im 63/81  soft-tissue]
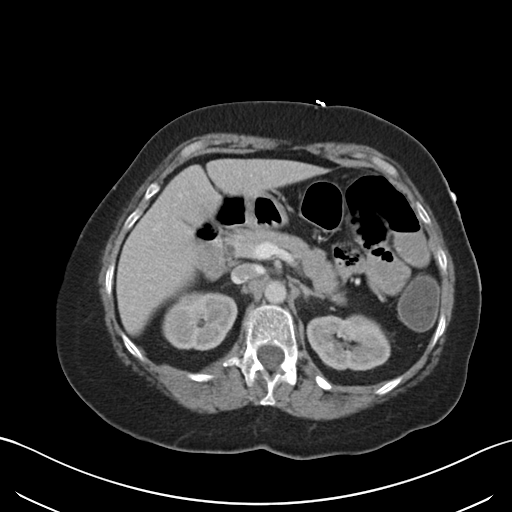
[im 67/81  soft-tissue]
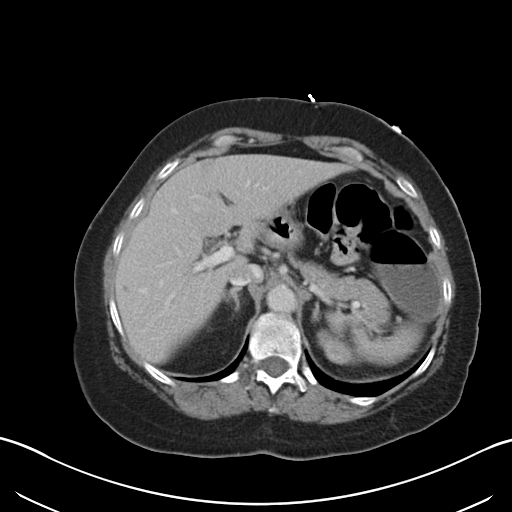
[im 76/81  soft-tissue]
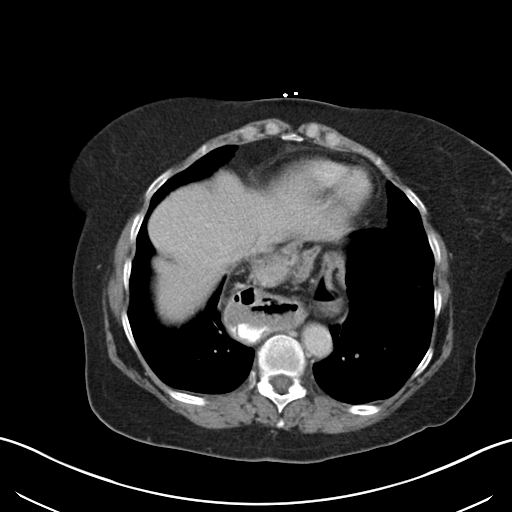

[Series 5: coronal · coronal · 0.78mm/px · 3 of 49 slices shown]
[im 17/49  soft-tissue]
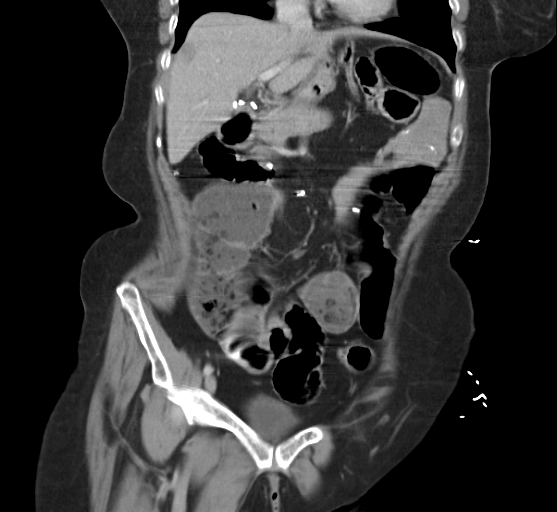
[im 22/49  soft-tissue]
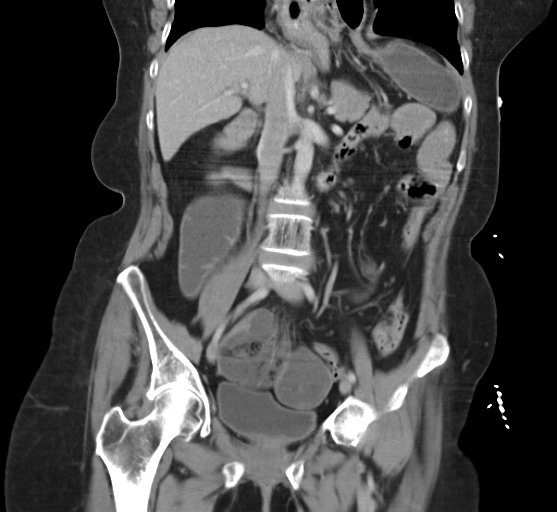
[im 27/49  soft-tissue]
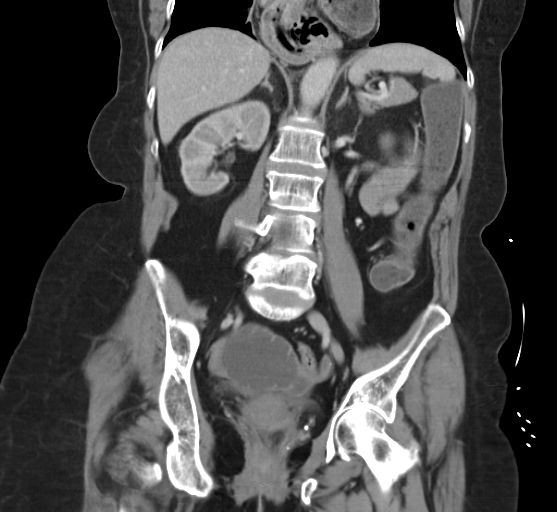

[15 of 46 positions shown; findings below may reference images not displayed]

FINDINGS: Slight fibrosis in the lung bases.  Large esophageal
hiatal hernia with most of the stomach in the chest.  Surgical
absence of the gallbladder.  The liver, spleen, pancreas, adrenal
glands, kidneys, abdominal aorta, and retroperitoneal lymph nodes
are unremarkable.

Postsurgical changes in the upper abdomen with bowel staples
consistent with bowel resections.  There is colonic dilatation with
air-fluid levels in the colon down to the level of the sigmoid
descending colon junction where there is focal change in caliber.
There is enhancement of the wall in this area.  This suggests an
inflammatory stricture although colonic neoplasm is not entirely
excluded.  The colon proximal to this area is distended.  Of note,
the transverse colon extends into the hiatal hernia and there is
some narrowing at the point of the hernia but the colon both before
and after the hernia is distended.  I believe the site of
obstruction is at the lower descending/sigmoid junction.  There is
also mild dilatation of small bowel loops with fluid and gas
present.  Focal narrowing of a small bowel loop in the mid abdomen
probably representing inflammatory stricture.  This is similar to
the changes seen on the prior study except there is no acute
inflammatory infiltration in this area today. No evidence of
abscess.

No free air or free fluid in the abdomen.  There is focal gas in
the subcutaneous tissues over the left lower abdomen.  This could
represent injection site.  Otherwise, soft tissue infection is
suggested.

Pelvis:  The rectum and sigmoid colon are decompressed.  No obvious
inflammatory changes.  The bladder wall is not thickened.  Uterus
and adnexal structures are not enlarged.  No free or loculated
pelvic fluid collections.  The appendix is surgically absent by
history.  There appears to have been a partial right colectomy with
ileal colonic anastomoses.  Normal alignment of the lumbar
vertebra.  Expansion of the bony spinal canal in the sacrum, likely
representing cyst or prominence of the thecal sac.
IMPRESSION: There appears to be a focal inflammatory stricture at the junction
of the sigmoid and descending colon with proximal colonic
distension and air-fluid levels.  Associated dilatation of the
residual small bowel. Findings are consistent with Crohn's
stricture although neoplasm is not entirely excluded.  No intra-
abdominal abscess.  Note that there is a large esophageal hiatal
hernia containing most of the stomach.  The splenic fracture of the
colon also extends up into the hiatal hernia.  Multiple additional
findings as discussed.

Findings were discussed by telephone with Dr. Dutta at the time of
dictation, 0105 hours on 12/10/2011.

## 2012-12-25 ENCOUNTER — Other Ambulatory Visit: Payer: Self-pay

## 2012-12-25 ENCOUNTER — Encounter (HOSPITAL_COMMUNITY): Payer: Self-pay

## 2012-12-25 ENCOUNTER — Emergency Department (HOSPITAL_COMMUNITY): Payer: Medicare Other

## 2012-12-25 ENCOUNTER — Inpatient Hospital Stay (HOSPITAL_COMMUNITY)
Admission: EM | Admit: 2012-12-25 | Discharge: 2013-01-01 | DRG: 682 | Disposition: A | Discharge status: Home / Self Care | Payer: Medicare Other | Attending: Internal Medicine | Admitting: Internal Medicine

## 2012-12-25 DIAGNOSIS — D649 Anemia, unspecified: Secondary | ICD-10-CM

## 2012-12-25 DIAGNOSIS — N390 Urinary tract infection, site not specified: Secondary | ICD-10-CM | POA: Diagnosis not present

## 2012-12-25 DIAGNOSIS — R197 Diarrhea, unspecified: Secondary | ICD-10-CM

## 2012-12-25 DIAGNOSIS — K5090 Crohn's disease, unspecified, without complications: Secondary | ICD-10-CM | POA: Diagnosis not present

## 2012-12-25 DIAGNOSIS — E871 Hypo-osmolality and hyponatremia: Secondary | ICD-10-CM

## 2012-12-25 DIAGNOSIS — R531 Weakness: Secondary | ICD-10-CM

## 2012-12-25 DIAGNOSIS — N179 Acute kidney failure, unspecified: Secondary | ICD-10-CM | POA: Diagnosis not present

## 2012-12-25 DIAGNOSIS — E876 Hypokalemia: Secondary | ICD-10-CM | POA: Diagnosis not present

## 2012-12-25 DIAGNOSIS — R432 Parageusia: Secondary | ICD-10-CM

## 2012-12-25 DIAGNOSIS — M549 Dorsalgia, unspecified: Secondary | ICD-10-CM

## 2012-12-25 DIAGNOSIS — Z452 Encounter for adjustment and management of vascular access device: Secondary | ICD-10-CM | POA: Diagnosis not present

## 2012-12-25 DIAGNOSIS — R0602 Shortness of breath: Secondary | ICD-10-CM

## 2012-12-25 DIAGNOSIS — K449 Diaphragmatic hernia without obstruction or gangrene: Secondary | ICD-10-CM

## 2012-12-25 DIAGNOSIS — K56609 Unspecified intestinal obstruction, unspecified as to partial versus complete obstruction: Secondary | ICD-10-CM

## 2012-12-25 DIAGNOSIS — K219 Gastro-esophageal reflux disease without esophagitis: Secondary | ICD-10-CM

## 2012-12-25 DIAGNOSIS — R1310 Dysphagia, unspecified: Secondary | ICD-10-CM

## 2012-12-25 DIAGNOSIS — R05 Cough: Secondary | ICD-10-CM | POA: Diagnosis not present

## 2012-12-25 DIAGNOSIS — J189 Pneumonia, unspecified organism: Secondary | ICD-10-CM

## 2012-12-25 DIAGNOSIS — R109 Unspecified abdominal pain: Secondary | ICD-10-CM | POA: Diagnosis not present

## 2012-12-25 DIAGNOSIS — E860 Dehydration: Secondary | ICD-10-CM | POA: Diagnosis not present

## 2012-12-25 DIAGNOSIS — R5381 Other malaise: Secondary | ICD-10-CM | POA: Diagnosis not present

## 2012-12-25 DIAGNOSIS — R059 Cough, unspecified: Secondary | ICD-10-CM

## 2012-12-25 DIAGNOSIS — D72829 Elevated white blood cell count, unspecified: Secondary | ICD-10-CM | POA: Diagnosis present

## 2012-12-25 DIAGNOSIS — K611 Rectal abscess: Secondary | ICD-10-CM

## 2012-12-25 DIAGNOSIS — D75839 Thrombocytosis, unspecified: Secondary | ICD-10-CM

## 2012-12-25 DIAGNOSIS — R7989 Other specified abnormal findings of blood chemistry: Secondary | ICD-10-CM

## 2012-12-25 DIAGNOSIS — D473 Essential (hemorrhagic) thrombocythemia: Secondary | ICD-10-CM

## 2012-12-25 DIAGNOSIS — R112 Nausea with vomiting, unspecified: Secondary | ICD-10-CM

## 2012-12-25 LAB — CBC WITH DIFFERENTIAL/PLATELET
Eosinophils Absolute: 0.1 10*3/uL (ref 0.0–0.7)
Eosinophils Relative: 1 % (ref 0–5)
Lymphs Abs: 4.7 10*3/uL — ABNORMAL HIGH (ref 0.7–4.0)
MCH: 28.4 pg (ref 26.0–34.0)
MCV: 81.3 fL (ref 78.0–100.0)
Platelets: 597 10*3/uL — ABNORMAL HIGH (ref 150–400)
RBC: 4.75 MIL/uL (ref 3.87–5.11)
RDW: 14.3 % (ref 11.5–15.5)

## 2012-12-25 LAB — COMPREHENSIVE METABOLIC PANEL
ALT: 89 U/L — ABNORMAL HIGH (ref 0–35)
Calcium: 9.6 mg/dL (ref 8.4–10.5)
GFR calc Af Amer: 25 mL/min — ABNORMAL LOW (ref >90)
Glucose, Bld: 92 mg/dL (ref 70–99)
Sodium: 125 mEq/L — ABNORMAL LOW (ref 135–145)
Total Protein: 9.6 g/dL — ABNORMAL HIGH (ref 6.0–8.3)

## 2012-12-25 LAB — URINALYSIS, ROUTINE W REFLEX MICROSCOPIC
Bilirubin Urine: NEGATIVE
Nitrite: NEGATIVE
Specific Gravity, Urine: 1.014 (ref 1.005–1.030)
pH: 6 (ref 5.0–8.0)

## 2012-12-25 LAB — URINE MICROSCOPIC-ADD ON

## 2012-12-25 IMAGING — CR DG ABDOMEN ACUTE W/ 1V CHEST
3 series · 3 of 3 positions shown · non-contrast
Comparison: CT abdomen pelvis dated 12/10/2011

CLINICAL DATA: Crohn disease, fatigue

ACUTE ABDOMEN SERIES (ABDOMEN 2 VIEW & CHEST 1 VIEW)

[w chest pa]
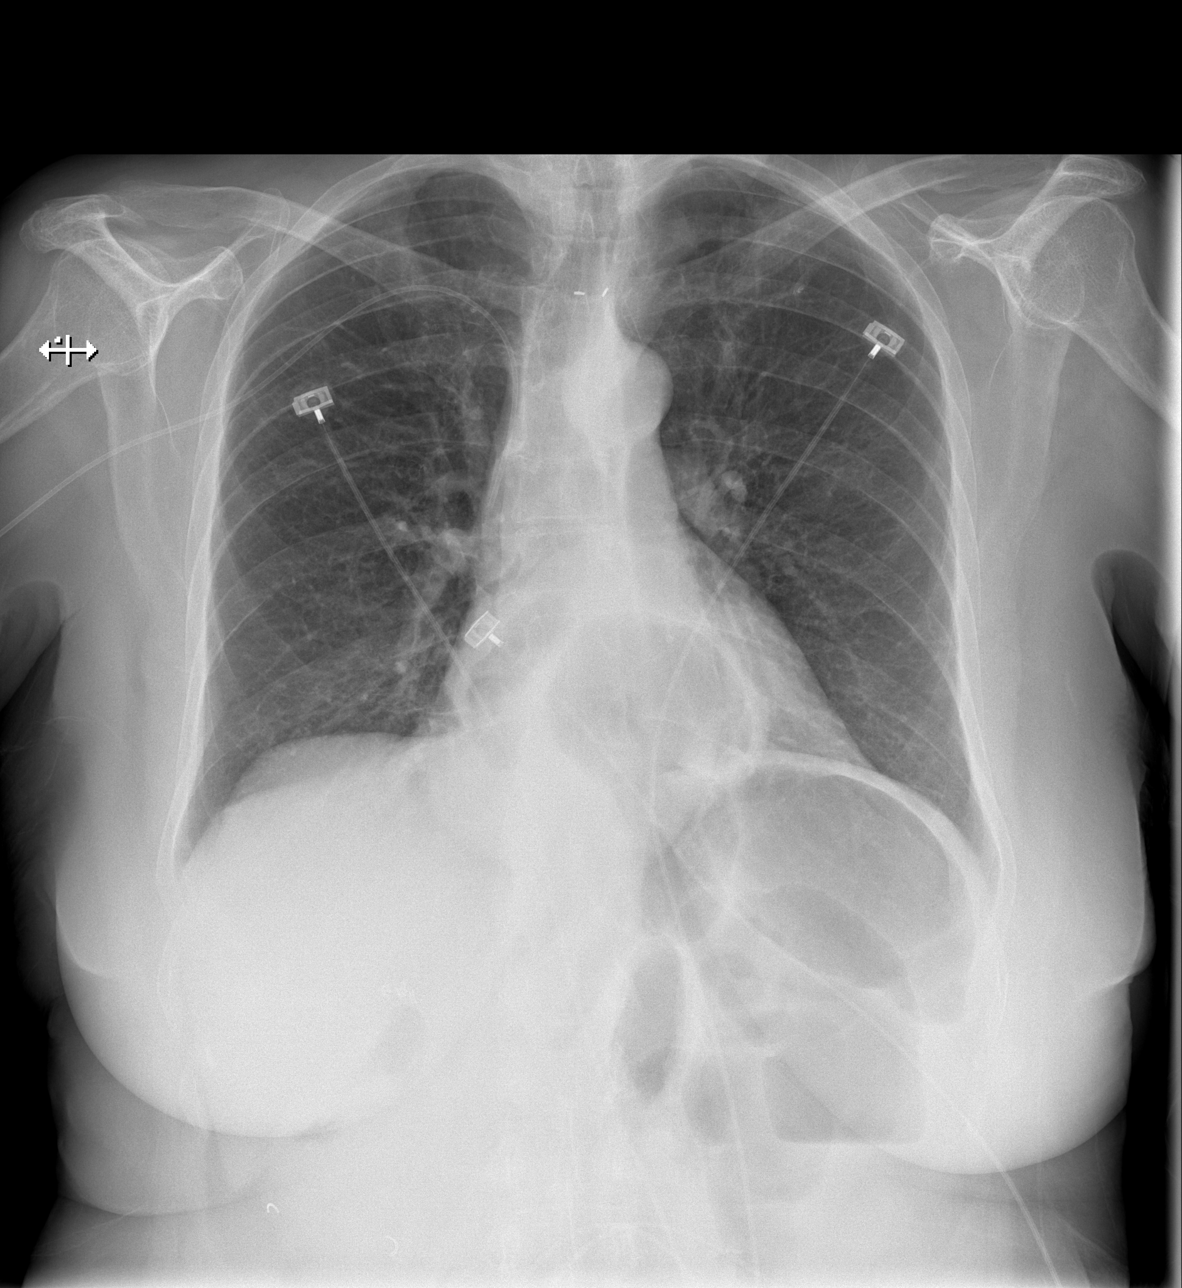

[w abdomen upright]
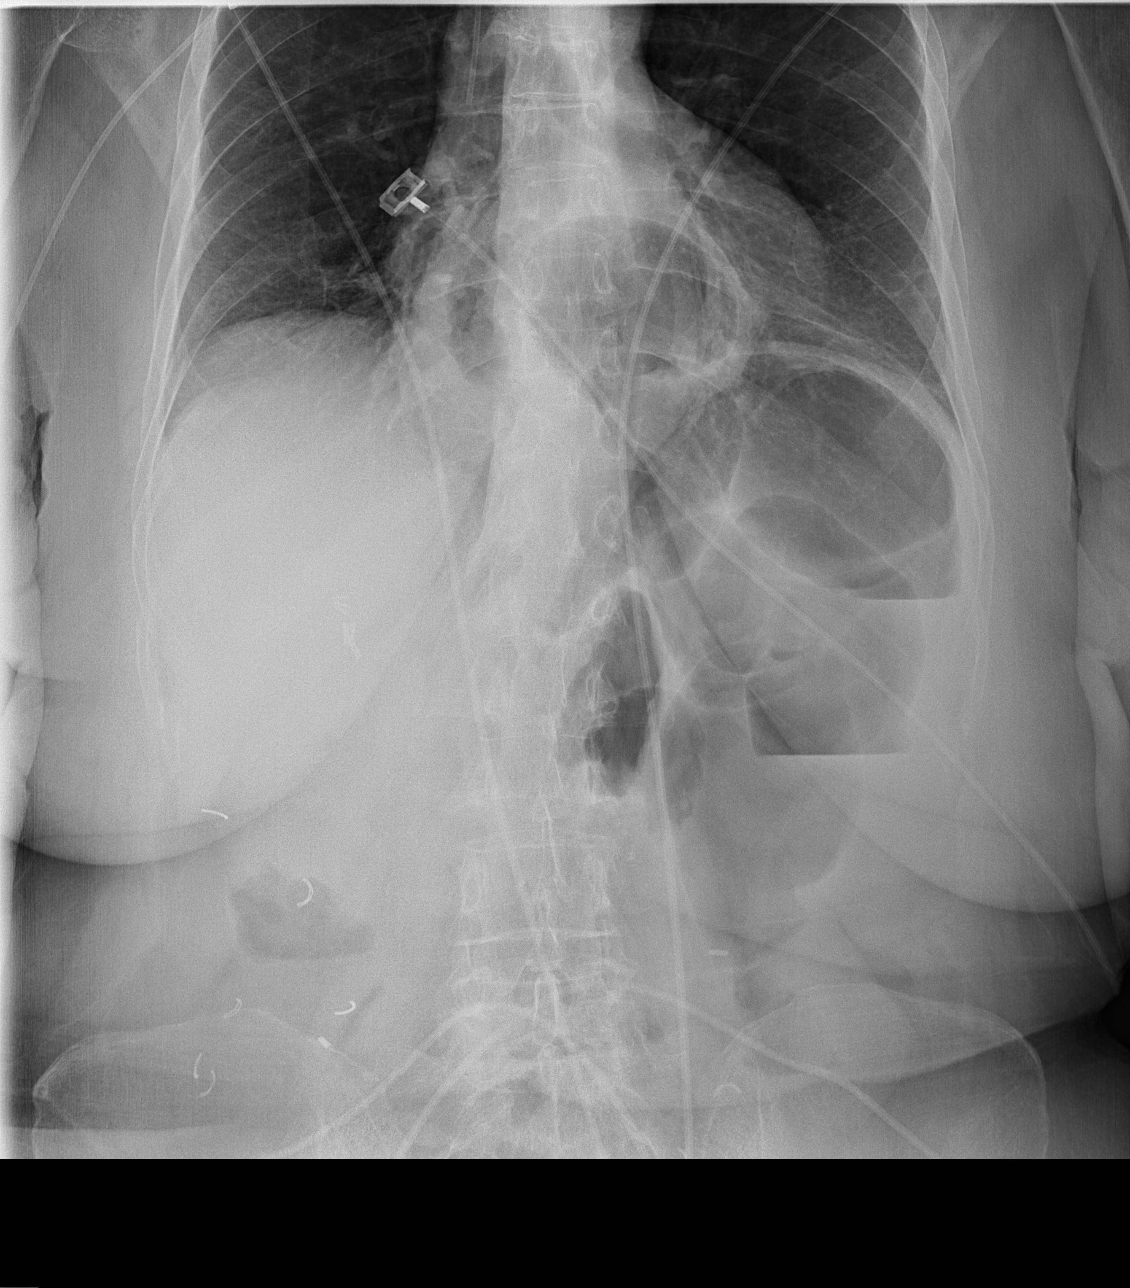

[t abdomen supine]
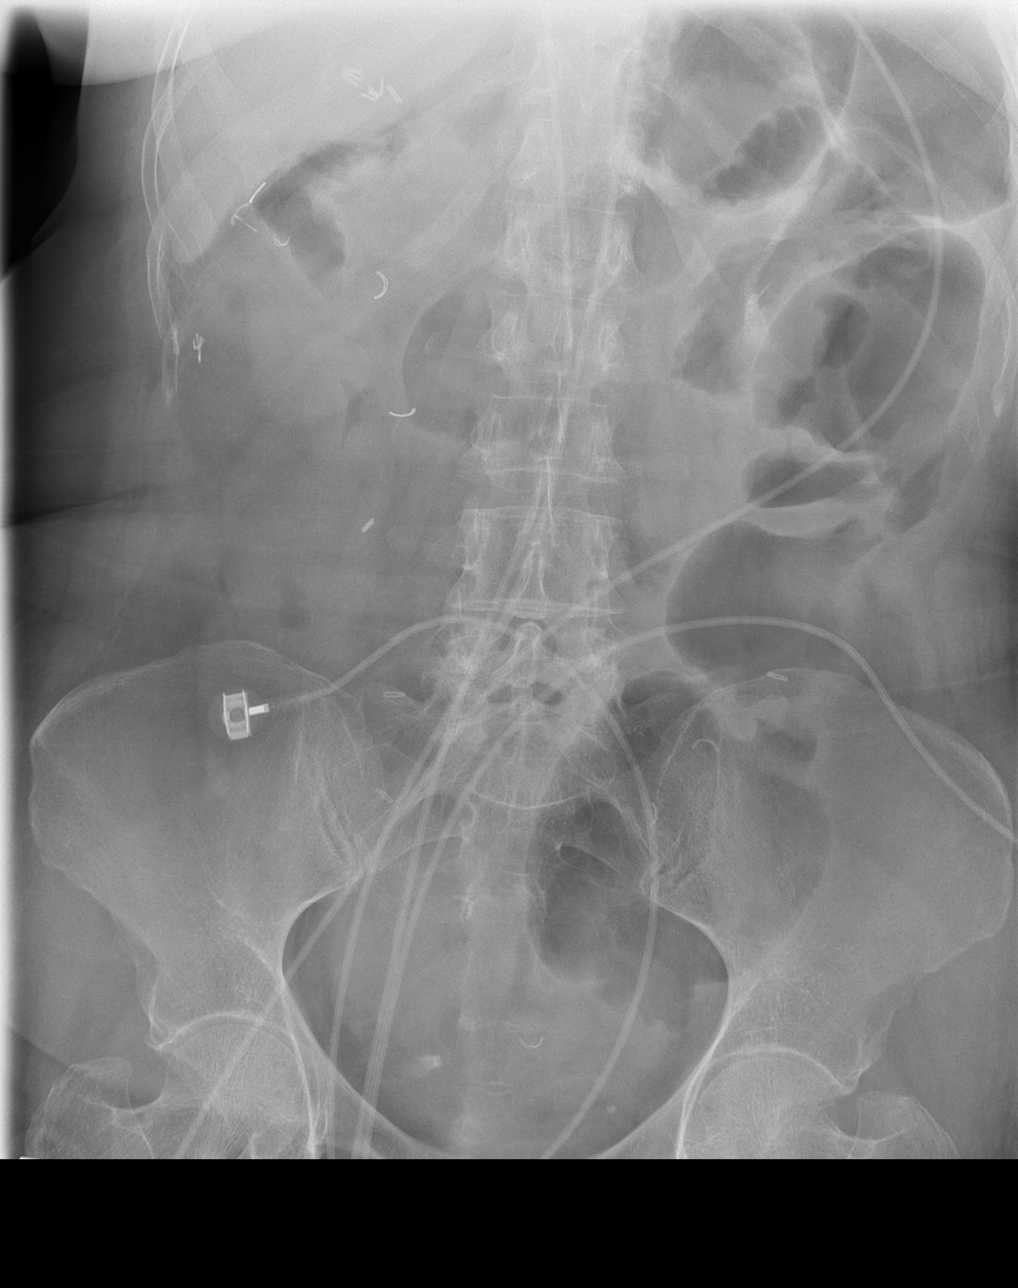

[3 of 3 positions shown; findings below may reference images not displayed]

FINDINGS: Lungs are clear. No pleural effusion or pneumothorax.

The heart is top normal in size.  Moderate hiatal hernia.

Right subclavian PICC.

Multiple dilated loops of bowel in the left upper abdomen.  When
correlating with recent CT, this is likely secondary to distal
colonic stricture.

No evidence of free air under the diaphragm on the upright view.

Cystectomy clips in the right upper abdomen.  Additional surgical
clips overlying the lower abdomen.

Degenerative changes of the visualized thoracolumbar spine.
IMPRESSION: No evidence of acute cardiopulmonary disease.

Multiple dilated loops of bowel in the left upper abdomen, likely
secondary to distal colonic stricture when correlating with CT.

No free air.

## 2012-12-25 MED ORDER — DEXTROSE 5 % IV SOLN
1.0000 g | Freq: Three times a day (TID) | INTRAVENOUS | Status: DC
  Filled 2012-12-25 (×2): qty 1

## 2012-12-25 MED ORDER — LEVOFLOXACIN IN D5W 750 MG/150ML IV SOLN: 750.0000 mg | INTRAVENOUS | Status: DC

## 2012-12-25 MED ORDER — SODIUM CHLORIDE 0.9 % IV SOLN
Freq: Once | INTRAVENOUS | Status: AC
  Administered 2012-12-25: 19:00:00 via INTRAVENOUS

## 2012-12-25 MED ORDER — PIPERACILLIN-TAZOBACTAM 3.375 G IVPB
3.3750 g | Freq: Three times a day (TID) | INTRAVENOUS | Status: DC
  Administered 2012-12-26 – 2012-12-29 (×11): 3.375 g via INTRAVENOUS
  Filled 2012-12-25 (×14): qty 50

## 2012-12-25 MED ORDER — VANCOMYCIN HCL IN DEXTROSE 1-5 GM/200ML-% IV SOLN
1000.0000 mg | Freq: Once | INTRAVENOUS | Status: AC
  Administered 2012-12-25: 1000 mg via INTRAVENOUS
  Filled 2012-12-25: qty 200

## 2012-12-25 MED ORDER — POTASSIUM CHLORIDE IN NACL 40-0.9 MEQ/L-% IV SOLN
INTRAVENOUS | Status: AC
  Administered 2012-12-26: 01:00:00 via INTRAVENOUS
  Filled 2012-12-25 (×2): qty 1000

## 2012-12-25 MED ORDER — VANCOMYCIN HCL 1000 MG IV SOLR: 750.0000 mg | INTRAVENOUS | Status: DC

## 2012-12-25 MED ORDER — ENOXAPARIN SODIUM 30 MG/0.3ML ~~LOC~~ SOLN
30.0000 mg | SUBCUTANEOUS | Status: DC
  Administered 2012-12-26 – 2012-12-30 (×5): 30 mg via SUBCUTANEOUS
  Filled 2012-12-25 (×7): qty 0.3

## 2012-12-25 MED ORDER — PANTOPRAZOLE SODIUM 40 MG PO TBEC
40.0000 mg | DELAYED_RELEASE_TABLET | Freq: Every day | ORAL | Status: DC
  Administered 2012-12-26 – 2013-01-01 (×7): 40 mg via ORAL
  Filled 2012-12-25 (×7): qty 1

## 2012-12-25 MED ORDER — SODIUM CHLORIDE 0.9 % IV SOLN
INTRAVENOUS | Status: DC
  Administered 2012-12-25: 20:00:00 via INTRAVENOUS

## 2012-12-25 MED ORDER — PIPERACILLIN-TAZOBACTAM IN DEX 2-0.25 GM/50ML IV SOLN
2.2500 g | Freq: Four times a day (QID) | INTRAVENOUS | Status: DC
  Filled 2012-12-25 (×3): qty 50

## 2012-12-25 MED ORDER — SODIUM CHLORIDE 0.9 % IV SOLN: INTRAVENOUS | Status: DC

## 2012-12-25 MED ORDER — ONDANSETRON HCL 4 MG/2ML IJ SOLN
4.0000 mg | Freq: Once | INTRAMUSCULAR | Status: AC
  Administered 2012-12-25: 4 mg via INTRAVENOUS
  Filled 2012-12-25: qty 2

## 2012-12-25 MED ORDER — DEXTROSE 5 % IV SOLN
1.0000 g | INTRAVENOUS | Status: DC
  Filled 2012-12-25: qty 1

## 2012-12-25 MED ORDER — POTASSIUM CHLORIDE 20 MEQ/15ML (10%) PO LIQD
40.0000 meq | Freq: Once | ORAL | Status: AC
  Administered 2012-12-25: 40 meq via ORAL
  Filled 2012-12-25: qty 30

## 2012-12-25 NOTE — Progress Notes (Addendum)
ANTIBIOTIC CONSULT NOTE - INITIAL  Pharmacy Consult for Vanco x 8d Indication: rule out pneumonia  Allergies  Allergen Reactions  . Aspirin Other (See Comments)    Cramps her stomach  . Sulfa Antibiotics Nausea And Vomiting    Patient Measurements:   Adjusted Body Weight: 72 kg  Vital Signs: Temp: 97.5 F (36.4 C) (01/19 1719) Temp src: Oral (01/19 1719) BP: 104/61 mmHg (01/19 1930) Pulse Rate: 104  (01/19 2130) Intake/Output from previous day:   Intake/Output from this shift:    Labs:  [facility] 12/25/12 1844  WBC 16.3*  HGB 13.5  PLT 597*  LABCREA --  CREATININE 2.28*   The CrCl is unknown because both a height and weight (above a minimum accepted value) are required for this calculation. No results found for this basename: VANCOTROUGH:2,VANCOPEAK:2,VANCORANDOM:2,GENTTROUGH:2,GENTPEAK:2,GENTRANDOM:2,TOBRATROUGH:2,TOBRAPEAK:2,TOBRARND:2,AMIKACINPEAK:2,AMIKACINTROU:2,AMIKACIN:2, in the last 72 hours   Microbiology: No results found for this or any previous visit (from the past 720 hour(s)).  Medical History: Past Medical History  Diagnosis Date  . Crohn's disease   . Chronic diarrhea   . Joint pain   . GERD (gastroesophageal reflux disease)   . Polyarthralgia   . Shortness of breath   . Pneumonia   . Anemia   . Hernia (acquired) (recurrent)     hiatal  . Hiatal hernia   . Back pain   . Hemorrhoids     Medications: pending  Assessment: SOB, emesis, weakness Patient previously treatment with abx (completed 1 week ago) for PNA. Now with SOB, vomiting, weakness, diarrhea. Patient has known h/o Crohn's dz with chronic diarrhea. Dirty UA. Dehydration  Abnormal labs: Na 125, K=2.6, bicarb=8, Scr 2.28, alkphos 125, AST/ALT=43/89, WBC 16.3. Calculated CrCl 27  Goal of Therapy:  Vancomycin trough level 15-20 mcg/ml  Plan:  Change Cefepime 1g/24h Vanco 1g IV x 1 then 750mg  IV q24h. Change Levaquin 750mg  IV q48h   Misty Stanley  Stillinger 12/25/2012,9:42 PM  Addum:  D/C cefepime and levaquin.  Start zosyn 3.375 gm IV q8 hours.

## 2012-12-25 NOTE — H&P (Signed)
PCP:   [name], MD   Chief Complaint:  Cough, sob  HPI: 67 yo female h/o crohns on remicade was dx with pna about 5weeks ago placed on oral abx (unsure) and completed course.  She has not recovered from that since.  She lives with her sister.  Has been very weak. With persistent chills and subj fever.  nonprod nonbloody cough.  Not eating well at all.  Found to have persistent infiltrate on cxr.  Review of Systems:  O/w neg  Past Medical History: Past Medical History  Diagnosis Date  . Crohn's disease   . Chronic diarrhea   . Joint pain   . GERD (gastroesophageal reflux disease)   . Polyarthralgia   . Shortness of breath   . Pneumonia   . Anemia   . Hernia (acquired) (recurrent)     hiatal  . Hiatal hernia   . Back pain   . Hemorrhoids    Past Surgical History  Procedure Date  . Bowel resection   . Colostomy     [name]  . Anal stenosis   . Colonoscopy 2000    needs to have one  . Cholecystectomy   . Appendectomy   . Goiter 1999  . Esophagogastroduodenoscopy 11/11/2011    Procedure: ESOPHAGOGASTRODUODENOSCOPY (EGD);  Surgeon: Barrie Folk, MD;  Location: [facility] ENDOSCOPY;  Service: Endoscopy;  Laterality: N/A;  . Flexible sigmoidoscopy 11/11/2011    Procedure: FLEXIBLE SIGMOIDOSCOPY;  Surgeon: Barrie Folk, MD;  Location: [facility] ENDOSCOPY;  Service: Endoscopy;  Laterality: N/A;  . Esophagogastroduodenoscopy 05/18/2012    Procedure: ESOPHAGOGASTRODUODENOSCOPY (EGD);  Surgeon: Malissa Hippo, MD;  Location: [facility] ENDO SUITE;  Service: Endoscopy;  Laterality: N/A;  200    Medications: Prior to Admission medications   Medication Sig Start Date End Date Taking? Authorizing Provider  bifidobacterium infantis (ALIGN) capsule Take 1 capsule by mouth daily. 06/28/12 06/28/13 Yes Malissa Hippo, MD  Calcium-Vitamin D 600-200 MG-UNIT per tablet Take 1 tablet by mouth daily. Do not resume this medication until it has been cleared with your Gastroenterologist. 05/24/12  Yes Malissa Hippo, MD  Cyanocobalamin (VITAMIN B-12 IJ) Inject as directed every 30 (thirty) days.     Yes Historical Provider, MD  dicyclomine (BENTYL) 10 MG/5ML syrup Take 15 mg by mouth 3 (three) times daily as needed. For IBS   Yes Historical Provider, MD  HYDROcodone-acetaminophen (NORCO/VICODIN) 5-325 MG per tablet Take 1 tablet by mouth every 6 (six) hours as needed. For pain   Yes Historical Provider, MD  inFLIXimab (REMICADE) 100 MG injection Inject 100 mg into the vein every 6 (six) weeks. Discuss with your gastroenterologist before resuming remicade. 04/01/12  Yes Tora Kindred York, PA  multivitamin [facility]) per tablet Take 1 tablet by mouth daily. 12/18/11  Yes Alison Murray, MD  pantoprazole (PROTONIX) 40 MG tablet Take 1 tablet (40 mg total) by mouth daily. 12/18/11  Yes Alison Murray, MD  promethazine (PHENERGAN) 25 MG tablet Take 1 tablet (25 mg total) by mouth every 6 (six) hours as needed for nausea. 12/21/12  Yes Len Blalock, NP  spironolactone (ALDACTONE) 50 MG tablet TAKE 1 TABLET (50 MG TOTAL) BY MOUTH DAILY. 07/08/12  Yes Len Blalock, NP  zolpidem (AMBIEN) 10 MG tablet Take 1 tablet (10 mg total) by mouth as needed. insomnia 12/18/11  Yes Alison Murray, MD    Allergies:   Allergies  Allergen Reactions  . Aspirin Other (See Comments)    Cramps her  stomach  . Sulfa Antibiotics Nausea And Vomiting    Social History:  reports that she quit smoking about 16 years ago. Her smoking use included Cigarettes. She has a 1 pack-year smoking history. She has never used smokeless tobacco. She reports that she does not drink alcohol or use illicit drugs.  Family History: Family History  Problem Relation Age of Onset  . Diabetes Mother   . Healthy Sister   . Hypertension Brother   . Colon cancer Brother     died with colon cancer    Physical Exam: Filed Vitals:   12/25/12 1745 12/25/12 1800 12/25/12 1830 12/25/12 1900  BP: 90/58 85/63 100/63 99/66  Pulse: 102 102 99   Temp:        TempSrc:      Resp: 16 25 20 20   SpO2: 98% 100% 98%    General appearance: alert, cooperative and no distress Neck: no JVD and supple, symmetrical, trachea midline Lungs: clear to auscultation bilaterally Heart: regular rate and rhythm, S1, S2 normal, no murmur, click, rub or gallop Abdomen: soft, non-tender; bowel sounds normal; no masses,  no organomegaly Extremities: extremities normal, atraumatic, no cyanosis or edema Pulses: 2+ and symmetric Skin: Skin color, texture, turgor normal. No rashes or lesions Neurologic: Grossly normal    Labs on Admission:   [facility] 12/25/12 1844  NA 125*  K 2.6*  CL 98  CO2 8*  GLUCOSE 92  BUN 33*  CREATININE 2.28*  CALCIUM 9.6  MG --  PHOS --    Basename 12/25/12 1844  AST 43*  ALT 89*  ALKPHOS 135*  BILITOT 0.3  PROT 9.6*  ALBUMIN 3.5    Basename 12/25/12 1844  WBC 16.3*  NEUTROABS 10.4*  HGB 13.5  HCT 38.6  MCV 81.3  PLT 597*   Radiological Exams on Admission: Dg Chest 2 View  12/07/2012  *RADIOLOGY REPORT*  Clinical Data: Nausea and vomiting; right lateral chest pain.  CHEST - 2 VIEW  Comparison: Chest radiograph performed 11/06/2011  Findings: The lungs are well-aerated.  Bibasilar airspace opacification is noted, worse on the right.  This raises concern for pneumonia; aspiration cannot be entirely excluded.  No pleural effusion or pneumothorax is seen.  The heart is borderline normal in size.  A somewhat complex large hiatal hernia is again noted, filled with air.  No acute osseous abnormalities are seen.  IMPRESSION:  1.  Bibasilar airspace opacification, worse on the right.  This raises concern for pneumonia; aspiration cannot be entirely excluded. 2.  Somewhat complex large hiatal hernia again noted, filled with air.   Original Report Authenticated By: Tonia Ghent, M.D.    Dg Abd Acute W/chest  12/25/2012  *RADIOLOGY REPORT*  Clinical Data: Weakness and history of pneumonia.  History of Crohn's disease.  ACUTE  ABDOMEN SERIES (ABDOMEN 2 VIEW & CHEST 1 VIEW)  Comparison: 12/06/2012  Findings: The chest radiograph demonstrates patchy densities at the right lung base.  Again noted is a massive hiatal hernia.  The left lung is clear.  No evidence for free air.  Surgical clips throughout the abdomen.  There are air-fluid levels in the left upper quadrant with dilated bowel loops.  No significant gas in the rectum.  IMPRESSION: Recurrent or residual airspace disease at the right lung base. Findings are concerning for pneumonia.  Recommend close follow-up of this area to ensure resolution.  If this disease does not resolve, recommend further characterization with CT.  Dilated bowel loops in the left upper abdomen.  Partial bowel obstruction cannot be excluded based on these findings.   Original Report Authenticated By: Richarda Overlie, M.D.     Assessment/Plan 67 yo female with persistent pna despite outpt therapy  Principal Problem:  *PNA (pneumonia) Active Problems:  Hypokalemia  Dysphagia  Leukocytosis  Crohn's disease  Hyponatremia  UTI (lower urinary tract infection)  Weakness generalized  Acute renal failure  Dehydration  Cough  SOB (shortness of breath)  Immunocompromised on remicade.  Place on iv vanc/zosyn.  pna pathway.  H/o dysphagia which will need to be checked again while here.  Also urine positive, send blood, sputum and urine cultures.  Dehydrated, place on ivf and monitor lytes and cr levels.  vss oxygen normal on RA.  Med bed full admit.  Zohar Laing A 12/25/2012, 9:36 PM

## 2012-12-25 NOTE — ED Notes (Signed)
Pt states she's had pneumonia since the beginning of January.  Pt states she started vomiting last night.  Pt c/o generalized weakness and increasing SOB.

## 2012-12-25 NOTE — ED Provider Notes (Signed)
History     CSN: [id number]  Arrival date & time 12/25/12  1713   First MD Initiated Contact with Patient 12/25/12 1829      Chief Complaint  Patient presents with  . Shortness of Breath  . Emesis  . Weakness    (Consider location/radiation/quality/duration/timing/severity/associated sxs/prior treatment) Patient is a 67 y.o. female presenting with shortness of breath, vomiting, and weakness. The history is provided by the patient and medical records. No language interpreter was used.  Shortness of Breath  The current episode started more than 1 week ago (Patient was treated for pneumonia on 12/07/2012. She completed her antibiotic treatment..A week ago she started to get short of breath. She had paroxysms of cough and had vomiting. She feels very weak and has no appetite. She's also had significant diarrhe). The onset was gradual. The problem occurs occasionally. The problem has been gradually worsening. The problem is severe. Nothing relieves the symptoms. Nothing aggravates the symptoms. Associated symptoms include sore throat, cough and shortness of breath. Pertinent negatives include no fever. Associated symptoms comments: Cough, diarrhea.. She has not inhaled smoke recently. Prior hospitalizations: A recent hospitalization. Past medical history comments: History of Crohn's disease. Recent pneumonia.. There were no sick contacts. Recently, medical care has been given by a specialist and at this facility. Services received include medications given.  Emesis  Associated symptoms include cough and diarrhea. Pertinent negatives include no chills and no fever.  Weakness The primary symptoms include nausea and vomiting. Primary symptoms do not include fever.  Additional symptoms include weakness.    Past Medical History  Diagnosis Date  . Crohn's disease   . Chronic diarrhea   . Joint pain   . GERD (gastroesophageal reflux disease)   . Polyarthralgia   . Shortness of breath   .  Pneumonia   . Anemia   . Hernia (acquired) (recurrent)     hiatal  . Hiatal hernia   . Back pain   . Hemorrhoids     Past Surgical History  Procedure Date  . Bowel resection   . Colostomy     [name]  . Anal stenosis   . Colonoscopy 2000    needs to have one  . Cholecystectomy   . Appendectomy   . Goiter 1999  . Esophagogastroduodenoscopy 11/11/2011    Procedure: ESOPHAGOGASTRODUODENOSCOPY (EGD);  Surgeon: Barrie Folk, MD;  Location: [facility] ENDOSCOPY;  Service: Endoscopy;  Laterality: N/A;  . Flexible sigmoidoscopy 11/11/2011    Procedure: FLEXIBLE SIGMOIDOSCOPY;  Surgeon: Barrie Folk, MD;  Location: [facility] ENDOSCOPY;  Service: Endoscopy;  Laterality: N/A;  . Esophagogastroduodenoscopy 05/18/2012    Procedure: ESOPHAGOGASTRODUODENOSCOPY (EGD);  Surgeon: Malissa Hippo, MD;  Location: [facility] ENDO SUITE;  Service: Endoscopy;  Laterality: N/A;  200    Family History  Problem Relation Age of Onset  . Diabetes Mother   . Healthy Sister   . Hypertension Brother   . Colon cancer Brother     died with colon cancer    History  Substance Use Topics  . Smoking status: Former Smoker -- 0.2 packs/day for 4 years    Types: Cigarettes    Quit date: 10/05/1996  . Smokeless tobacco: Never Used     Comment: 1 pack every two weeks when she smoked  . Alcohol Use: No     Comment: denies    OB History    Grav Para Term Preterm Abortions TAB SAB Ect Mult Living  Review of Systems  Constitutional: Negative for fever and chills.       Generalized weakness.  HENT: Positive for sore throat.   Respiratory: Positive for cough and shortness of breath.   Cardiovascular: Negative for palpitations and leg swelling.  Gastrointestinal: Positive for nausea, vomiting and diarrhea.       Post-tussive emesis.  Genitourinary: Negative.   Musculoskeletal: Negative.   Skin: Negative.   Neurological: Positive for weakness.  Psychiatric/Behavioral: Negative.     Allergies  Aspirin and  Sulfa antibiotics  Home Medications   Current Outpatient Rx  Name  Route  Sig  Dispense  Refill  . ALIGN PO CAPS   Oral   Take 1 capsule by mouth daily.   30 capsule   5   . CALCIUM-VITAMIN D 600-200 MG-UNIT PO TABS   Oral   Take 1 tablet by mouth daily. Do not resume this medication until it has been cleared with your Gastroenterologist.   30 tablet   3   . VITAMIN B-12 IJ   Injection   Inject as directed every 30 (thirty) days.           [name] DICYCLOMINE HCL 10 MG/5ML PO SOLN   Oral   Take 15 mg by mouth 3 (three) times daily as needed. For IBS         . HYDROCODONE-ACETAMINOPHEN 5-325 MG PO TABS   Oral   Take 1 tablet by mouth every 6 (six) hours as needed. For pain         . INFLIXIMAB 100 MG IV SOLR   Intravenous   Inject 100 mg into the vein every 6 (six) weeks. Discuss with your gastroenterologist before resuming remicade.   1 each   0   . MULTIVITAMINS PO TABS   Oral   Take 1 tablet by mouth daily.   30 tablet   3   . PANTOPRAZOLE SODIUM 40 MG PO TBEC   Oral   Take 1 tablet (40 mg total) by mouth daily.   30 tablet   5   . PROMETHAZINE HCL 25 MG PO TABS   Oral   Take 1 tablet (25 mg total) by mouth every 6 (six) hours as needed for nausea.   60 tablet   3   . SPIRONOLACTONE 50 MG PO TABS      TAKE 1 TABLET (50 MG TOTAL) BY MOUTH DAILY.   30 tablet   2   . ZOLPIDEM TARTRATE 10 MG PO TABS   Oral   Take 1 tablet (10 mg total) by mouth as needed. insomnia   30 tablet   0     BP 85/63  Pulse 102  Temp 97.5 F (36.4 C) (Oral)  Resp 25  SpO2 100%  Physical Exam  Nursing note and vitals reviewed. Constitutional: She is oriented to person, place, and time.       Slender elderly woman, frail appearance, blood pressure 90/67.  HENT:  Head: Normocephalic and atraumatic.  Right Ear: External ear normal.  Left Ear: External ear normal.  Mouth/Throat: Oropharynx is clear and moist.  Eyes: Conjunctivae normal and EOM are normal. Pupils  are equal, round, and reactive to light. No scleral icterus.  Neck: Normal range of motion. Neck supple.       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  Cardiovascular: Normal rate, regular rhythm and normal heart sounds.   Pulmonary/Chest: Effort normal and breath sounds normal.  Abdominal: Soft. Bowel sounds are normal. She exhibits no distension. There is  no tenderness.  Musculoskeletal: Normal range of motion. She exhibits no edema.  Neurological: She is alert and oriented to person, place, and time.       No sensory or motor deficit.  Skin: Skin is warm and dry.  Psychiatric: She has a normal mood and affect. Her behavior is normal.    ED Course  Procedures (including critical care time)   Labs Reviewed  CORTISOL  TSH  CBC WITH DIFFERENTIAL  COMPREHENSIVE METABOLIC PANEL  URINALYSIS, ROUTINE W REFLEX MICROSCOPIC    Date: 12/25/2012  Rate: 108  Rhythm: sinus tachycardia  QRS Axis: left  Intervals: QT prolonged QRS:  Left ventricular hypertrophy  ST/T Wave abnormalities: ST depressions anteriorly and ST depressions laterally  Conduction Disutrbances:none  Narrative Interpretation: Abnormal EKG  Old EKG Reviewed: unchanged  6:41 PM Pt seen --> physical exam performed.  IV fluids ordered.  Lab workup ordered.  Results for orders placed during the hospital encounter of 12/25/12  CBC WITH DIFFERENTIAL      Component Value Range   WBC 16.3 (*) 4.0 - 10.5 K/uL   RBC 4.75  3.87 - 5.11 MIL/uL   Hemoglobin 13.5  12.0 - 15.0 g/dL   HCT 10.2  72.5 - 36.6 %   MCV 81.3  78.0 - 100.0 fL   MCH 28.4  26.0 - 34.0 pg   MCHC 35.0  30.0 - 36.0 g/dL   RDW 44.0  34.7 - 42.5 %   Platelets 597 (*) 150 - 400 K/uL   Neutrophils Relative 64  43 - 77 %   Neutro Abs 10.4 (*) 1.7 - 7.7 K/uL   Lymphocytes Relative 29  12 - 46 %   Lymphs Abs 4.7 (*) 0.7 - 4.0 K/uL   Monocytes Relative 6  3 - 12 %   Monocytes Absolute 1.0  0.1 - 1.0 K/uL   Eosinophils Relative 1  0 - 5 %   Eosinophils Absolute 0.1  0.0 -  0.7 K/uL   Basophils Relative 0  0 - 1 %   Basophils Absolute 0.0  0.0 - 0.1 K/uL  COMPREHENSIVE METABOLIC PANEL      Component Value Range   Sodium 125 (*) 135 - 145 mEq/L   Potassium 2.6 (*) 3.5 - 5.1 mEq/L   Chloride 98  96 - 112 mEq/L   CO2 8 (*) 19 - 32 mEq/L   Glucose, Bld 92  70 - 99 mg/dL   BUN 33 (*) 6 - 23 mg/dL   Creatinine, Ser 9.56 (*) 0.50 - 1.10 mg/dL   Calcium 9.6  8.4 - 38.7 mg/dL   Total Protein 9.6 (*) 6.0 - 8.3 g/dL   Albumin 3.5  3.5 - 5.2 g/dL   AST 43 (*) 0 - 37 U/L   ALT 89 (*) 0 - 35 U/L   Alkaline Phosphatase 135 (*) 39 - 117 U/L   Total Bilirubin 0.3  0.3 - 1.2 mg/dL   GFR calc non Af Amer 21 (*) >90 mL/min   GFR calc Af Amer 25 (*) >90 mL/min   Dg Chest 2 View  12/07/2012  *RADIOLOGY REPORT*  Clinical Data: Nausea and vomiting; right lateral chest pain.  CHEST - 2 VIEW  Comparison: Chest radiograph performed 11/06/2011  Findings: The lungs are well-aerated.  Bibasilar airspace opacification is noted, worse on the right.  This raises concern for pneumonia; aspiration cannot be entirely excluded.  No pleural effusion or pneumothorax is seen.  The heart is borderline normal in size.  A  somewhat complex large hiatal hernia is again noted, filled with air.  No acute osseous abnormalities are seen.  IMPRESSION:  1.  Bibasilar airspace opacification, worse on the right.  This raises concern for pneumonia; aspiration cannot be entirely excluded. 2.  Somewhat complex large hiatal hernia again noted, filled with air.   Original Report Authenticated By: Tonia Ghent, M.D.     K is low at 2.6.  She has renal insufficiency with BUN 33 and creatinine 2.28.  She has elevated WBC of 16,300.  Acute abdominal series is pending.  UA is pending; in and out cath ordered.  PO KCL ordered.  9:13 PM Acute abdominal series shows RLL pneumonia.  Will call [facility] to admit her for HCAP.     9:34 PM Case discussed with Dr. Onalee Hua, admit to [facility] to med surg  bed.   1. HCAP (healthcare-associated pneumonia)   2. Acute renal failure   3. Cough   4. Crohn's disease   5. Dysphagia   6. Hypokalemia   7. Hyponatremia   8. Dehydration            Carleene Cooper III, MD 12/26/12 [phone]

## 2012-12-25 NOTE — ED Notes (Signed)
CRITICAL VALUE ALERT  Critical value received:  CO2  Date of notification:  12/25/2012  Time of notification:  1934  Critical value read back:yes  Nurse who received alert:  MG Jenelle Mages, RN  MD notified (1st page):  Dr. Ignacia Palma  Time of first page:  1934  MD notified (2nd page):  Time of second page:  Responding MD:  Dr. Ignacia Palma  Time MD responded:  [phone]

## 2012-12-25 NOTE — ED Provider Notes (Deleted)
Date: 12/25/2012  Rate: 108  Rhythm: sinus tachycardia  QRS Axis: left  Intervals: QT prolonged QRS:  Left ventricular hypertrophy  ST/T Wave abnormalities: ST depressions anteriorly and ST depressions laterally  Conduction Disutrbances:none  Narrative Interpretation: Abnormal EKG  Old EKG Reviewed: unchanged    Carleene Cooper III, MD 12/25/12 1758

## 2012-12-25 NOTE — ED Notes (Signed)
CRITICAL VALUE ALERT  Critical value received:  Potassium 2.6  Date of notification:  12/25/2012  Time of notification:  1934  Critical value read back:yes  Nurse who received alert:  MG Jenelle Mages, RN  MD notified (1st page):  Dr. Ignacia Palma  Time of first page:  1934  MD notified (2nd page):  Time of second page:  Responding MD:  Dr. Ignacia Palma  Time MD responded:  [phone]

## 2012-12-26 ENCOUNTER — Inpatient Hospital Stay (HOSPITAL_COMMUNITY): Payer: Medicare Other

## 2012-12-26 DIAGNOSIS — E860 Dehydration: Secondary | ICD-10-CM

## 2012-12-26 LAB — TSH: TSH: 1.713 u[IU]/mL (ref 0.350–4.500)

## 2012-12-26 LAB — EXPECTORATED SPUTUM ASSESSMENT W GRAM STAIN, RFLX TO RESP C

## 2012-12-26 LAB — CBC
Hemoglobin: 11.8 g/dL — ABNORMAL LOW (ref 12.0–15.0)
MCHC: 34.2 g/dL (ref 30.0–36.0)
RBC: 4.24 MIL/uL (ref 3.87–5.11)
WBC: 14.3 10*3/uL — ABNORMAL HIGH (ref 4.0–10.5)

## 2012-12-26 LAB — PROCALCITONIN: Procalcitonin: 0.3 ng/mL

## 2012-12-26 LAB — BASIC METABOLIC PANEL
Chloride: 107 mEq/L (ref 96–112)
GFR calc Af Amer: 24 mL/min — ABNORMAL LOW (ref >90)
GFR calc non Af Amer: 20 mL/min — ABNORMAL LOW (ref >90)
Glucose, Bld: 107 mg/dL — ABNORMAL HIGH (ref 70–99)
Potassium: 3 mEq/L — ABNORMAL LOW (ref 3.5–5.1)
Sodium: 132 mEq/L — ABNORMAL LOW (ref 135–145)

## 2012-12-26 LAB — LACTIC ACID, PLASMA: Lactic Acid, Venous: 1.7 mmol/L (ref 0.5–2.2)

## 2012-12-26 MED ORDER — IOHEXOL 300 MG/ML  SOLN
25.0000 mL | INTRAMUSCULAR | Status: AC
  Administered 2012-12-26 (×2): 25 mL via ORAL

## 2012-12-26 MED ORDER — HYDROMORPHONE HCL PF 1 MG/ML IJ SOLN
0.5000 mg | INTRAMUSCULAR | Status: DC | PRN
  Administered 2012-12-26 – 2012-12-28 (×3): 0.5 mg via INTRAVENOUS
  Filled 2012-12-26 (×3): qty 1

## 2012-12-26 MED ORDER — VANCOMYCIN HCL 1000 MG IV SOLR
750.0000 mg | INTRAVENOUS | Status: DC
  Administered 2012-12-26 – 2012-12-27 (×2): 750 mg via INTRAVENOUS
  Filled 2012-12-26 (×4): qty 750

## 2012-12-26 MED ORDER — AZITHROMYCIN 500 MG PO TABS
500.0000 mg | ORAL_TABLET | Freq: Every day | ORAL | Status: AC
  Administered 2012-12-26: 500 mg via ORAL
  Filled 2012-12-26 (×2): qty 1

## 2012-12-26 MED ORDER — ONDANSETRON HCL 4 MG/2ML IJ SOLN
4.0000 mg | Freq: Four times a day (QID) | INTRAMUSCULAR | Status: DC | PRN
  Administered 2012-12-26 – 2012-12-31 (×6): 4 mg via INTRAVENOUS
  Filled 2012-12-26 (×5): qty 2

## 2012-12-26 MED ORDER — POTASSIUM CHLORIDE CRYS ER 20 MEQ PO TBCR
40.0000 meq | EXTENDED_RELEASE_TABLET | Freq: Once | ORAL | Status: AC
  Administered 2012-12-26: 40 meq via ORAL
  Filled 2012-12-26: qty 2

## 2012-12-26 MED ORDER — ONDANSETRON HCL 4 MG/2ML IJ SOLN
INTRAMUSCULAR | Status: AC
  Filled 2012-12-26: qty 2

## 2012-12-26 MED ORDER — SODIUM BICARBONATE 8.4 % IV SOLN
INTRAVENOUS | Status: DC
  Administered 2012-12-26: 16:00:00 via INTRAVENOUS
  Filled 2012-12-26 (×4): qty 150

## 2012-12-26 MED ORDER — HYDROMORPHONE HCL PF 1 MG/ML IJ SOLN
INTRAMUSCULAR | Status: AC
  Filled 2012-12-26: qty 1

## 2012-12-26 MED ORDER — AZITHROMYCIN 250 MG PO TABS
250.0000 mg | ORAL_TABLET | Freq: Every day | ORAL | Status: DC
  Administered 2012-12-27 – 2012-12-29 (×3): 250 mg via ORAL
  Filled 2012-12-26 (×3): qty 1

## 2012-12-26 MED ORDER — OXYCODONE HCL 5 MG PO TABS
5.0000 mg | ORAL_TABLET | ORAL | Status: DC | PRN
  Filled 2012-12-26: qty 1

## 2012-12-26 MED ORDER — SODIUM CHLORIDE 0.9 % IV SOLN: INTRAVENOUS | Status: DC

## 2012-12-26 NOTE — Progress Notes (Signed)
  Comment: Called with panic lab report.  Patient has a CO2 of 7, AG of 18. This is a high anion gap metabolic acidosis of unclear etiology, but likely multi-factorial, due to dehydration/ARF, pneumonia and possibly early sepsis. Besides patient has LLQ abdominal abdominal pain/diarrhea.   Plan: 1. Bicarb infusion. 2. Procalcitonin level. 3. Abdominal/peklvic CT with oral contrast. 4. ABG, Lactic acid levels.  Transfer to SDU for closer observation.

## 2012-12-26 NOTE — Evaluation (Signed)
Clinical/Bedside Swallow Evaluation Patient Details  Name: Danielle [name] MRN: [id number] Date of Birth: 1946/01/03  Today's Date: 12/26/2012 Time: 2130-8657 SLP Time Calculation (min): 19 min  Past Medical History:  Past Medical History  Diagnosis Date  . Crohn's disease   . Chronic diarrhea   . Joint pain   . GERD (gastroesophageal reflux disease)   . Polyarthralgia   . Shortness of breath   . Pneumonia   . Anemia   . Hernia (acquired) (recurrent)     hiatal  . Hiatal hernia   . Back pain   . Hemorrhoids    Past Surgical History:  Past Surgical History  Procedure Date  . Bowel resection   . Colostomy     [name]  . Anal stenosis   . Colonoscopy 2000    needs to have one  . Cholecystectomy   . Appendectomy   . Goiter 1999  . Esophagogastroduodenoscopy 11/11/2011    Procedure: ESOPHAGOGASTRODUODENOSCOPY (EGD);  Surgeon: Barrie Folk, MD;  Location: [facility] ENDOSCOPY;  Service: Endoscopy;  Laterality: N/A;  . Flexible sigmoidoscopy 11/11/2011    Procedure: FLEXIBLE SIGMOIDOSCOPY;  Surgeon: Barrie Folk, MD;  Location: [facility] ENDOSCOPY;  Service: Endoscopy;  Laterality: N/A;  . Esophagogastroduodenoscopy 05/18/2012    Procedure: ESOPHAGOGASTRODUODENOSCOPY (EGD);  Surgeon: Malissa Hippo, MD;  Location: [facility] ENDO SUITE;  Service: Endoscopy;  Laterality: N/A;  200   HPI:  67 yo female h/o Crohn's, s/p bowel resection/colostomy which was subsequently reversed, on Remicade, hiatal hernia/GERD, polyarthralgia, chronic anemia and Goiter. She completed a course of oral antibiotics for a pneumonia about 5 weeks ago, but feels that she has not recovered from that since. She has been very weak, with persistent chills and subjective fever, non-productive cough and poor appetite. Found to have persistent infiltrate on CXR.[name]  Pt seen by SLP during hospitalization here december 2012.  At that time, she had a clinical swallow eval, which revealed likely normal oropharyngeal swallow and symptoms of  esophageal dysphagia.  Pt underwent endoscopy and limited barium swallow during that admission; large HH identified.  Reports dilatation.     Assessment / Plan / Recommendation Clinical Impression  Pt's swallow function presents similarly to function when evaluated one year ago.  Oropharyngeal swallow is WFL.    Pt weak; coughing frothy, copious amount of mucus during eval.  Only willing to consume limited POs.  Presents with persisting belching throughout PO intake (symptoms present during assessment one year ago.) Symptoms appear related to esophageal issues.   Recommend continued regular consistency diet with thin liquids.  No SLP f/u warranted.    Aspiration Risk       Diet Recommendation Regular;Thin liquid   Liquid Administration via: Straw;Cup Medication Administration: Crushed with puree Supervision: Patient able to self feed Compensations: Slow rate Postural Changes and/or Swallow Maneuvers: Seated upright 90 degrees;Upright 30-60 min after meal    Other  Recommendations Oral Care Recommendations: Oral care BID   Follow Up Recommendations  None       Pertinent Vitals/Pain No pain    SLP Swallow Goals     Swallow Study Prior Functional Status       General Date of Onset: 12/25/12 HPI: 67 yo female h/o Crohn's, s/p bowel resection/colostomy which was subsequently reversed, on Remicade, hiatal hernia/GERD, polyarthralgia, chronic anemia and Goiter. She completed a course of oral antibiotics for a pneumonia about 5 weeks ago, but feels that she has not recovered from that since. She has been very weak, with persistent  chills and subjective fever, non-productive cough and poor appetite. Found to have persistent infiltrate on CXR.[name]  Pt seen by SLP during hospitalization here december 2012.  At that time, she had a clinical swallow eval, which revealed likely normal oropharyngeal swallow and symptoms of esophageal dysphagia.  Pt underwent endoscopy and limited barium swallow  during that admission; large HH identified.  Reports dilatation.    Type of Study: Bedside swallow evaluation  Previous Swallow Assessment:   Bedside swallow eval 11/10/11: Pt presents with normal oropharyngeal swallow with no indication of pharyngeal deficits nor compromised airway protection.  Her described symptoms: globus, regurgitation of chicken bolus with subsequent chronic pain/pressure, increased mucous production/expectoration, suggest an esophageal condition.  Recommend consideration of a GI consult.  No further SLP f/u warranted.      Diet Prior to this Study: Regular;Thin liquids Temperature Spikes Noted: No Respiratory Status: Room air Behavior/Cognition: Alert;Cooperative Oral Cavity - Dentition: Dentures, top Self-Feeding Abilities: Able to feed self Patient Positioning: Upright in bed Baseline Vocal Quality: Clear Volitional Cough: Strong Volitional Swallow: Able to elicit    Oral/Motor/Sensory Function Overall Oral Motor/Sensory Function: Appears within functional limits for tasks assessed   Ice Chips Ice chips: Within functional limits   Thin Liquid Thin Liquid: Within functional limits Presentation: Straw;Self Fed    Nectar Thick Nectar Thick Liquid: Not tested   Honey Thick Honey Thick Liquid: Within functional limits   Puree Puree: Within functional limits Presentation: Self Fed   Solid   GO    Solid: Not tested (refused)      Danielle [name] L. Danielle [name], [place] CCC/SLP Pager [phone]  Blenda Mounts Danielle [name] 12/26/2012,12:01 PM

## 2012-12-26 NOTE — Progress Notes (Signed)
Patient transferred to 330 from %[address] per MD order. All vitals signs WNL, patient alert and oriented. Family at bedside.

## 2012-12-26 NOTE — Progress Notes (Signed)
[facility] PROGRESS NOTE  Danielle [name] UJW:[id number] DOB: Apr 13, 1946 DOA: 12/25/2012 PCP: [name], MD  Assessment/Plan: Principal Problem:  *PNA (pneumonia) Active Problems:  Hypokalemia  Dysphagia  Leukocytosis  Crohn's disease  Hyponatremia  UTI (lower urinary tract infection)  Weakness generalized  Acute renal failure  Dehydration  Cough  SOB (shortness of breath)    1. PNA (pneumonia): This appears to be persistent or recurrent. Patient presented with cough, intermittently productive of greenish phlegm, generally unwell with poor appetite, and CXR demonstrated patchy densities at the right lung base. Given the fact that patient is mmunocompromised on Remicade, managing with broad-spectrum antibiotics, Vanc/Zosyn, now day#2. Will also give a course of oral Azithromycin, to cover atypicals.  2. Query Dysphagia: Not clear wether patient has a history of dysphagia, although appetite is poor. Will request SLP consult.  3. UTI (lower urinary tract infection): Patient has a positive urinary sediment with many bacteria. Adequately covered by Zosyn. Awaiting urine culture.  4. Dehydration/Hyponatremia/ARF: Patient presented with a creatinine of 2.8, BUN 33, consistent with dehydration/ARF. Base line creatinine was 1.28 on 12/06/12. This is likely pre-renal, secondary to dehydration due to poor oral intake and diarrhea, as well as diuretic therapy. These same factors are also responsible for patient's presenting hyponatremia of 125. Diuretic is on hold, avoiding nephrotoxins, managing with iv fluids. 5. Hypokalemia: Due to diuretics and GI fluid loss. Repleting as indiicated. Will check magnesium levels.  6. Crohn's disease: Patient has had increased stool frequency to about 4-5 bowel movements per ad, (usually 2-3/day), over the last couple of weeks, and stools are watery. Will check stool for C. Difficile PCR. Danielle [name]      Code Status: Full Code. Family Communication:    Disposition Plan: To be determined.    Brief narrative: 67 yo female h/o Crohn's, s/p bowel resection/colostomy which was subsequently reversed, on Remicade, hiatal hernia/GERD, polyarthralgia, chronic anemia and Goiter. She completed a course of oral antibiotics for a pneumonia about 5 weeks ago, but feels that she has not recovered from that since. She has been very weak, with persistent chills and subjective fever, non-productive cough and poor appetite. Found to have persistent infiltrate on CXR.   Consultants:  N/A.   Procedures:  CXR/Abdominal X-Ray.   Antibiotics:  Vancomycin 12/25/12>>>  Zosyn 12/25/12>>>  HPI/Subjective: C/O LLQ crampy pain.  Objective: Vital signs in last 24 hours: Temp:  [97.5 F (36.4 C)-98.1 F (36.7 C)] 98 F (36.7 C) (01/20 0616) Pulse Rate:  [93-111] 93  (01/20 0616) Resp:  [16-25] 18  (01/20 0616) BP: (85-115)/(58-67) 102/58 mmHg (01/20 0616) SpO2:  [98 %-100 %] 100 % (01/20 0616) Weight:  [61.4 kg (135 lb 5.8 oz)] 61.4 kg (135 lb 5.8 oz) (01/19 2327) Weight change:     Intake/Output from previous day:       Physical Exam: General: Comfortable, alert, communicative, fully oriented, not short of breath at rest.  HEENT:  No clinical pallor, no jaundice, no conjunctival injection or discharge, still looks somewhat dehydrated. NECK:  Supple, JVP not seen, no carotid bruits, no palpable lymphadenopathy, no palpable goiter. CHEST:  Clinically clear to auscultation, no wheezes, no crackles. HEART:  Sounds 1 and 2 heard, normal, regular, no murmurs. ABDOMEN:  Full, soft, no scars, mild discomfort/tenderness LLQ, no guarding, no palpable organomegaly, no palpable masses, normal bowel sounds. GENITALIA:  Not examined. LOWER EXTREMITIES:  No pitting edema, palpable peripheral pulses. MUSCULOSKELETAL SYSTEM:  Generalized osteoarthritic changes, otherwise, normal. CENTRAL NERVOUS SYSTEM:  No focal  neurologic deficit on gross  examination.  Lab Results:  [facility]- Danielle [name] 12/25/12 1844  WBC 16.3*  HGB 13.5  HCT 38.6  PLT 597*    Basename 12/25/12 1844  NA 125*  K 2.6*  CL 98  CO2 8*  GLUCOSE 92  BUN 33*  CREATININE 2.28*  CALCIUM 9.6   Recent Results (from the past 240 hour(s))  CULTURE, EXPECTORATED SPUTUM-ASSESSMENT     Status: Normal   Collection Time   12/25/12 11:52 PM      Component Value Range Status Comment   Specimen Description SPUTUM   Final    Special Requests Immunocompromised   Final    Sputum evaluation     Final    Value: THIS SPECIMEN IS ACCEPTABLE. RESPIRATORY CULTURE REPORT TO FOLLOW.   Report Status 12/26/2012 FINAL   Final      Studies/Results: Dg Abd Acute W/chest  12/25/2012  *RADIOLOGY REPORT*  Clinical Data: Weakness and history of pneumonia.  History of Crohn's disease.  ACUTE ABDOMEN SERIES (ABDOMEN 2 VIEW & CHEST 1 VIEW)  Comparison: 12/06/2012  Findings: The chest radiograph demonstrates patchy densities at the right lung base.  Again noted is a massive hiatal hernia.  The left lung is clear.  No evidence for free air.  Surgical clips throughout the abdomen.  There are air-fluid levels in the left upper quadrant with dilated bowel loops.  No significant gas in the rectum.  IMPRESSION: Recurrent or residual airspace disease at the right lung base. Findings are concerning for pneumonia.  Recommend close follow-up of this area to ensure resolution.  If this disease does not resolve, recommend further characterization with CT.  Dilated bowel loops in the left upper abdomen.  Partial bowel obstruction cannot be excluded based on these findings.   Original Report Authenticated By: Danielle [name], M.D.     Medications: Scheduled Meds:   . enoxaparin (LOVENOX) injection  30 mg Subcutaneous Q24H  . pantoprazole  40 mg Oral Daily  . piperacillin-tazobactam (ZOSYN)  IV  3.375 g Intravenous Q8H   Continuous Infusions:   . sodium chloride    . 0.9 % NaCl with KCl 40 mEq / L 75 mL/hr at  12/26/12 0100   PRN Meds:.    LOS: 1 day   Nemiah Kissner,CHRISTOPHER  [facility] Pager [phone]. If 8PM-8AM, please contact night-coverage at [email or web address], password [facility] 12/26/2012, 7:31 AM  LOS: 1 day

## 2012-12-27 DIAGNOSIS — R197 Diarrhea, unspecified: Secondary | ICD-10-CM

## 2012-12-27 LAB — LEGIONELLA ANTIGEN, URINE: Legionella Antigen, Urine: NEGATIVE

## 2012-12-27 LAB — COMPREHENSIVE METABOLIC PANEL
ALT: 49 U/L — ABNORMAL HIGH (ref 0–35)
AST: 20 U/L (ref 0–37)
Albumin: 2.8 g/dL — ABNORMAL LOW (ref 3.5–5.2)
Alkaline Phosphatase: 99 U/L (ref 39–117)
Calcium: 8.8 mg/dL (ref 8.4–10.5)
GFR calc Af Amer: 26 mL/min — ABNORMAL LOW (ref >90)
Glucose, Bld: 100 mg/dL — ABNORMAL HIGH (ref 70–99)
Potassium: 2.8 mEq/L — ABNORMAL LOW (ref 3.5–5.1)
Sodium: 135 mEq/L (ref 135–145)
Total Protein: 7.6 g/dL (ref 6.0–8.3)

## 2012-12-27 LAB — BASIC METABOLIC PANEL
BUN: 20 mg/dL (ref 6–23)
CO2: 13 mEq/L — ABNORMAL LOW (ref 19–32)
Chloride: 106 mEq/L (ref 96–112)
Creatinine, Ser: 1.94 mg/dL — ABNORMAL HIGH (ref 0.50–1.10)
GFR calc Af Amer: 30 mL/min — ABNORMAL LOW (ref >90)
Potassium: 3 mEq/L — ABNORMAL LOW (ref 3.5–5.1)

## 2012-12-27 LAB — URINE CULTURE: Colony Count: 50000

## 2012-12-27 LAB — MAGNESIUM: Magnesium: 1.9 mg/dL (ref 1.5–2.5)

## 2012-12-27 LAB — CLOSTRIDIUM DIFFICILE BY PCR: Toxigenic C. Difficile by PCR: NEGATIVE

## 2012-12-27 MED ORDER — SODIUM CHLORIDE 0.9 % IJ SOLN
10.0000 mL | INTRAMUSCULAR | Status: DC | PRN
  Administered 2012-12-30 (×2): 10 mL

## 2012-12-27 MED ORDER — SODIUM CHLORIDE 0.9 % IV BOLUS (SEPSIS)
250.0000 mL | Freq: Once | INTRAVENOUS | Status: AC
  Administered 2012-12-27: 250 mL via INTRAVENOUS

## 2012-12-27 MED ORDER — POTASSIUM CHLORIDE CRYS ER 20 MEQ PO TBCR
40.0000 meq | EXTENDED_RELEASE_TABLET | Freq: Once | ORAL | Status: AC
  Administered 2012-12-27: 40 meq via ORAL
  Filled 2012-12-27: qty 2

## 2012-12-27 MED ORDER — LOPERAMIDE HCL 2 MG PO CAPS
2.0000 mg | ORAL_CAPSULE | Freq: Three times a day (TID) | ORAL | Status: DC
  Administered 2012-12-27 – 2012-12-31 (×13): 2 mg via ORAL
  Filled 2012-12-27 (×18): qty 1

## 2012-12-27 MED ORDER — SODIUM BICARBONATE 8.4 % IV SOLN
INTRAVENOUS | Status: DC
  Administered 2012-12-27: 23:00:00 via INTRAVENOUS
  Filled 2012-12-27 (×6): qty 100

## 2012-12-27 MED ORDER — POTASSIUM CHLORIDE CRYS ER 20 MEQ PO TBCR
40.0000 meq | EXTENDED_RELEASE_TABLET | Freq: Once | ORAL | Status: DC
  Filled 2012-12-27: qty 2

## 2012-12-27 MED ORDER — NYSTATIN-TRIAMCINOLONE 100000-0.1 UNIT/GM-% EX CREA
TOPICAL_CREAM | CUTANEOUS | Status: DC | PRN
  Filled 2012-12-27: qty 15
  Filled 2012-12-27: qty 30

## 2012-12-27 MED ORDER — POTASSIUM CHLORIDE 20 MEQ/15ML (10%) PO LIQD
40.0000 meq | Freq: Once | ORAL | Status: AC
  Administered 2012-12-27: 40 meq via ORAL
  Filled 2012-12-27: qty 30

## 2012-12-27 MED ORDER — SODIUM BICARBONATE 8.4 % IV SOLN
INTRAVENOUS | Status: AC
  Administered 2012-12-27: 12:00:00 via INTRAVENOUS
  Filled 2012-12-27: qty 100

## 2012-12-27 NOTE — Progress Notes (Signed)
Nursing 1515 Dr. Brien Few made aware that patient's SBP has been in the 80s consecutively for 3 readings despite increased IVF rate.  Order for NS 250cc IV bolus x1.  Bolus given, will continue to assess.

## 2012-12-27 NOTE — Progress Notes (Signed)
Peripherally Inserted Central Catheter/Midline Placement  The IV Nurse has discussed with the patient and/or persons authorized to consent for the patient, the purpose of this procedure and the potential benefits and risks involved with this procedure.  The benefits include less needle sticks, lab draws from the catheter and patient may be discharged home with the catheter.  Risks include, but not limited to, infection, bleeding, blood clot (thrombus formation), and puncture of an artery; nerve damage and irregular heat beat.  Alternatives to this procedure were also discussed.  PICC/Midline Placement Documentation  PICC / Midline Double Lumen 12/27/12 PICC Right Basilic (Active)       Stacie Glaze Horton 12/27/2012, 5:30 PM

## 2012-12-27 NOTE — Progress Notes (Signed)
Nursing 0830 Dr. Brien Few made aware of need for IV access, order for PICC line placed, ok to place with patient's Cr level.

## 2012-12-27 NOTE — Progress Notes (Signed)
Nursing 1000 Dr. Brien Few made aware of patient's abdominal pain.  RN made MD aware that pain medication has not been given due to patient's SBP in the 80s.  MD increased rate of patient's IVF and is ok with patient receiving Dilaudid 0.5mg  IV with current BP.  Dilaudid IV given, will continue to assess for pain relief.

## 2012-12-27 NOTE — Progress Notes (Signed)
CRITICAL VALUE ALERT  Critical value received:  CO2 9  Date of notification:  12-27-12  Time of notification:  0615  Critical value read back:yes  Nurse who received alert:  Maximino Greenland RN  MD notified (1st page):  Craige Cotta  Time of first page:  0624  MD notified (2nd page):  Time of second page:  Responding MD: Craige Cotta  Time MD responded: 0630

## 2012-12-27 NOTE — Progress Notes (Signed)
[facility] PROGRESS NOTE  Danielle [name]:[id number] DOB: 1946/03/28 DOA: 12/25/2012 PCP: [name], MD  Assessment/Plan: Principal Problem:  *PNA (pneumonia) Active Problems:  Hypokalemia  Dysphagia  Leukocytosis  Crohn's disease  Hyponatremia  UTI (lower urinary tract infection)  Weakness generalized  Acute renal failure  Dehydration  Cough  SOB (shortness of breath)    1. PNA (pneumonia): This appears to be persistent or recurrent. Patient presented with cough, intermittently productive of greenish phlegm, generally unwell with poor appetite, and CXR demonstrated patchy densities at the right lung base. Given the fact that patient is mmunocompromised on Remicade, managing with broad-spectrum antibiotics, Vanc/Zosyn day#3/Azithromycin day#2.  Patient has remained afebrile and feels better clinically. Strept Pneumoniae antigen is negative.  2. Query Dysphagia: Not clear whether patient has a history of dysphagia, although appetite is poor. SLP has evaluated, and recommended regular consistency diet.  3. UTI (lower urinary tract infection): Patient has a positive urinary sediment with many bacteria. Adequately covered by Zosyn. Urine culture revealed GNR. Identification is pending.  4. Dehydration/Hyponatremia/ARF: Patient presented with a creatinine of 2.8, BUN 33, consistent with dehydration/ARF. Base line creatinine was 1.28 on 12/06/12. This is likely pre-renal, secondary to dehydration due to poor oral intake and diarrhea, as well as diuretic therapy. These same factors are also responsible for patient's presenting hyponatremia of 125. Diuretic is on hold, avoiding nephrotoxins, managing with iv fluids, with improvement. 5. Metabolic Acidosis:  Patient was noted to have a CO2 of 7, AG of 18. This is a high anion gap metabolic acidosis of unclear etiology, but likely multi-factorial, due to dehydration/ARF, pneumonia. Early sepsis was considered, but Procalcitonin was  0.30 and lactic acid was 1.7. Managing with ivi bicarbonate infusion, as well as above measures.  6. Hypokalemia: Due to diuretics and GI fluid loss. Repleting as indiicated. Magnesium levels is normal.  7. Crohn's disease: Patient has had increased stool frequency to about 4-5 bowel movements per day, (usually 2-3/day), over the last couple of weeks, and stools are watery. C. Difficile PCR is negative. Abdominal/pelvic CT showed no acute findings within the abdomen or pelvis. Have commenced Loperamide.       Code Status: Full Code. Family Communication:  Disposition Plan: To be determined.    Brief narrative: 67 yo female h/o Crohn's, s/p bowel resection/colostomy which was subsequently reversed, on Remicade, hiatal hernia/GERD, polyarthralgia, chronic anemia and Goiter. She completed a course of oral antibiotics for a pneumonia about 5 weeks ago, but feels that she has not recovered from that since. She has been very weak, with persistent chills and subjective fever, non-productive cough and poor appetite. Found to have persistent infiltrate on CXR.   Consultants:  N/A.   Procedures:  CXR/Abdominal X-Ray.   Antibiotics:  Vancomycin 12/25/12>>>  Zosyn 12/25/12>>>  Azithromycin 12/26/12>>>  HPI/Subjective: Feels better.   Objective: Vital signs in last 24 hours: Temp:  [97.9 F (36.6 C)-98.4 F (36.9 C)] 98.2 F (36.8 C) (01/21 0737) Pulse Rate:  [78-98] 91  (01/21 0800) Resp:  [15-20] 20  (01/21 0800) BP: (89-101)/(54-58) 89/56 mmHg (01/21 0800) SpO2:  [99 %-100 %] 100 % (01/21 0800) Weight change:  Last BM Date: 12/27/12  Intake/Output from previous day: 01/20 0701 - 01/21 0700 In: 1415 [I.V.:1415] Out: -      Physical Exam: General: Comfortable, alert, communicative, fully oriented, not short of breath at rest.  HEENT:  No clinical pallor, no jaundice, no conjunctival injection or discharge. Hydration status has improved. [name] NECK:  Supple,  JVP not seen, no  carotid bruits, no palpable lymphadenopathy, no palpable goiter. CHEST:  Clinically clear to auscultation, no wheezes, no crackles. HEART:  Sounds 1 and 2 heard, normal, regular, no murmurs. ABDOMEN:  Full, soft, mild discomfort/tenderness LLQ, no guarding, no palpable organomegaly, no palpable masses, normal bowel sounds. GENITALIA:  Not examined. LOWER EXTREMITIES:  No pitting edema, palpable peripheral pulses. MUSCULOSKELETAL SYSTEM:  Generalized osteoarthritic changes, otherwise, normal. CENTRAL NERVOUS SYSTEM:  No focal neurologic deficit on gross examination.  Lab Results:  [facility] 12/26/12 1152 12/25/12 1844  WBC 14.3* 16.3*  HGB 11.8* 13.5  HCT 34.5* 38.6  PLT 477* 597*    Basename 12/27/12 0440 12/26/12 1150  NA 135 132*  K 2.8* 3.0*  CL 108 107  CO2 9* 7*  GLUCOSE 100* 107*  BUN 26* 31*  CREATININE 2.18* 2.37*  CALCIUM 8.8 9.2   Recent Results (from the past 240 hour(s))  URINE CULTURE     Status: Normal (Preliminary result)   Collection Time   12/25/12  9:03 PM      Component Value Range Status Comment   Specimen Description URINE, RANDOM   Final    Special Requests ADDED 2122   Final    Culture  Setup Time 12/25/2012 23:25   Final    Colony Count 50,000 COLONIES/ML   Final    Culture GRAM NEGATIVE RODS   Final    Report Status PENDING   Incomplete   CULTURE, BLOOD (ROUTINE X 2)     Status: Normal (Preliminary result)   Collection Time   12/25/12 10:13 PM      Component Value Range Status Comment   Specimen Description BLOOD RIGHT ARM   Final    Special Requests BOTTLES DRAWN AEROBIC ONLY 10CC   Final    Culture  Setup Time 12/26/2012 02:04   Final    Culture     Final    Value:        BLOOD CULTURE RECEIVED NO GROWTH TO DATE CULTURE WILL BE HELD FOR 5 DAYS BEFORE ISSUING A FINAL NEGATIVE REPORT   Report Status PENDING   Incomplete   CULTURE, BLOOD (ROUTINE X 2)     Status: Normal (Preliminary result)   Collection Time   12/25/12 10:22 PM      Component  Value Range Status Comment   Specimen Description BLOOD RIGHT HAND   Final    Special Requests BOTTLES DRAWN AEROBIC ONLY 1CC   Final    Culture  Setup Time 12/26/2012 02:04   Final    Culture     Final    Value:        BLOOD CULTURE RECEIVED NO GROWTH TO DATE CULTURE WILL BE HELD FOR 5 DAYS BEFORE ISSUING A FINAL NEGATIVE REPORT   Report Status PENDING   Incomplete   CULTURE, EXPECTORATED SPUTUM-ASSESSMENT     Status: Normal   Collection Time   12/25/12 11:52 PM      Component Value Range Status Comment   Specimen Description SPUTUM   Final    Special Requests Immunocompromised   Final    Sputum evaluation     Final    Value: THIS SPECIMEN IS ACCEPTABLE. RESPIRATORY CULTURE REPORT TO FOLLOW.   Report Status 12/26/2012 FINAL   Final   CULTURE, RESPIRATORY     Status: Normal (Preliminary result)   Collection Time   12/25/12 11:52 PM      Component Value Range Status Comment   Specimen Description SPUTUM  Final    Special Requests ADDED 0104   Final    Gram Stain     Final    Value: FEW WBC PRESENT, PREDOMINANTLY PMN     FEW SQUAMOUS EPITHELIAL CELLS PRESENT     RARE GRAM POSITIVE COCCI IN PAIRS   Culture NORMAL OROPHARYNGEAL FLORA   Final    Report Status PENDING   Incomplete   CLOSTRIDIUM DIFFICILE BY PCR     Status: Normal   Collection Time   12/26/12  5:50 PM      Component Value Range Status Comment   C difficile by pcr NEGATIVE  NEGATIVE Final   MRSA PCR SCREENING     Status: Normal   Collection Time   12/26/12  7:52 PM      Component Value Range Status Comment   MRSA by PCR NEGATIVE  NEGATIVE Final      Studies/Results: Ct Abdomen Pelvis Wo Contrast  12/26/2012  *RADIOLOGY REPORT*  Clinical Data: Left-sided abdominal pain.  Sepsis.  Acidosis. Crohn's disease.  CT ABDOMEN AND PELVIS WITHOUT CONTRAST  Technique:  Multidetector CT imaging of the abdomen and pelvis was performed following the standard protocol without intravenous contrast.  Comparison: 03/28/2012  Findings: A  large hiatal hernia is again seen.  Postop changes are again seen from previous bowel resections.  There is no evidence of bowel obstruction.  Surgical clips are also seen from prior cholecystectomy.  The liver, spleen, pancreas, adrenal glands, and kidneys have a normal appearance on this noncontrast study.  No evidence of hydronephrosis.  No soft tissue masses or lymphadenopathy identified within the abdomen or pelvis.  The no evidence of inflammatory process or abnormal fluid collections.  No evidence of bowel wall thickening, dilatation, or hernia.  Images obtained to the lung bases show new infiltrate in the anterior right lower lobe containing air bronchograms.  This is suspicious for pneumonia.  No pleural effusion identified.  IMPRESSION:  1.  No acute findings within the abdomen or pelvis.  No evidence of bowel obstruction. 2.  Stable appearance of large hiatal hernia. 3.  New infiltrate in the anterior right lower lobe, suspicious for pneumonia.   Original Report Authenticated By: Myles Rosenthal, M.D.    Dg Abd Acute W/chest  12/25/2012  *RADIOLOGY REPORT*  Clinical Data: Weakness and history of pneumonia.  History of Crohn's disease.  ACUTE ABDOMEN SERIES (ABDOMEN 2 VIEW & CHEST 1 VIEW)  Comparison: 12/06/2012  Findings: The chest radiograph demonstrates patchy densities at the right lung base.  Again noted is a massive hiatal hernia.  The left lung is clear.  No evidence for free air.  Surgical clips throughout the abdomen.  There are air-fluid levels in the left upper quadrant with dilated bowel loops.  No significant gas in the rectum.  IMPRESSION: Recurrent or residual airspace disease at the right lung base. Findings are concerning for pneumonia.  Recommend close follow-up of this area to ensure resolution.  If this disease does not resolve, recommend further characterization with CT.  Dilated bowel loops in the left upper abdomen.  Partial bowel obstruction cannot be excluded based on these findings.    Original Report Authenticated By: Richarda Overlie, M.D.     Medications: Scheduled Meds:    . azithromycin  250 mg Oral Daily  . enoxaparin (LOVENOX) injection  30 mg Subcutaneous Q24H  . pantoprazole  40 mg Oral Daily  . piperacillin-tazobactam (ZOSYN)  IV  3.375 g Intravenous Q8H  . vancomycin  750 mg Intravenous  Q24H   Continuous Infusions:    .  sodium bicarbonate  infusion 1000 mL 100 mL/hr at 12/26/12 1900   PRN Meds:.HYDROmorphone (DILAUDID) injection, ondansetron, oxyCODONE    LOS: 2 days   Aayliah Rotenberry,CHRISTOPHER  [facility] Pager [phone]. If 8PM-8AM, please contact night-coverage at [email or web address], password [facility] 12/27/2012, 10:11 AM  LOS: 2 days

## 2012-12-28 LAB — CULTURE, RESPIRATORY W GRAM STAIN

## 2012-12-28 LAB — BASIC METABOLIC PANEL
BUN: 13 mg/dL (ref 6–23)
Chloride: 104 mEq/L (ref 96–112)
Creatinine, Ser: 1.64 mg/dL — ABNORMAL HIGH (ref 0.50–1.10)
GFR calc non Af Amer: 32 mL/min — ABNORMAL LOW (ref >90)
Glucose, Bld: 100 mg/dL — ABNORMAL HIGH (ref 70–99)
Potassium: 2.4 mEq/L — CL (ref 3.5–5.1)

## 2012-12-28 LAB — CBC
HCT: 26.5 % — ABNORMAL LOW (ref 36.0–46.0)
Hemoglobin: 9.4 g/dL — ABNORMAL LOW (ref 12.0–15.0)
MCHC: 35.5 g/dL (ref 30.0–36.0)
MCV: 79.3 fL (ref 78.0–100.0)
RDW: 14.4 % (ref 11.5–15.5)

## 2012-12-28 LAB — POTASSIUM: Potassium: 2.6 mEq/L — CL (ref 3.5–5.1)

## 2012-12-28 IMAGING — CR DG ABDOMEN ACUTE W/ 1V CHEST
3 series · 3 of 3 positions shown · non-contrast
Comparison: Plain film 12/12/2011

CLINICAL DATA: Crohn's disease

ACUTE ABDOMEN SERIES (ABDOMEN 2 VIEW & CHEST 1 VIEW)

[w chest pa]
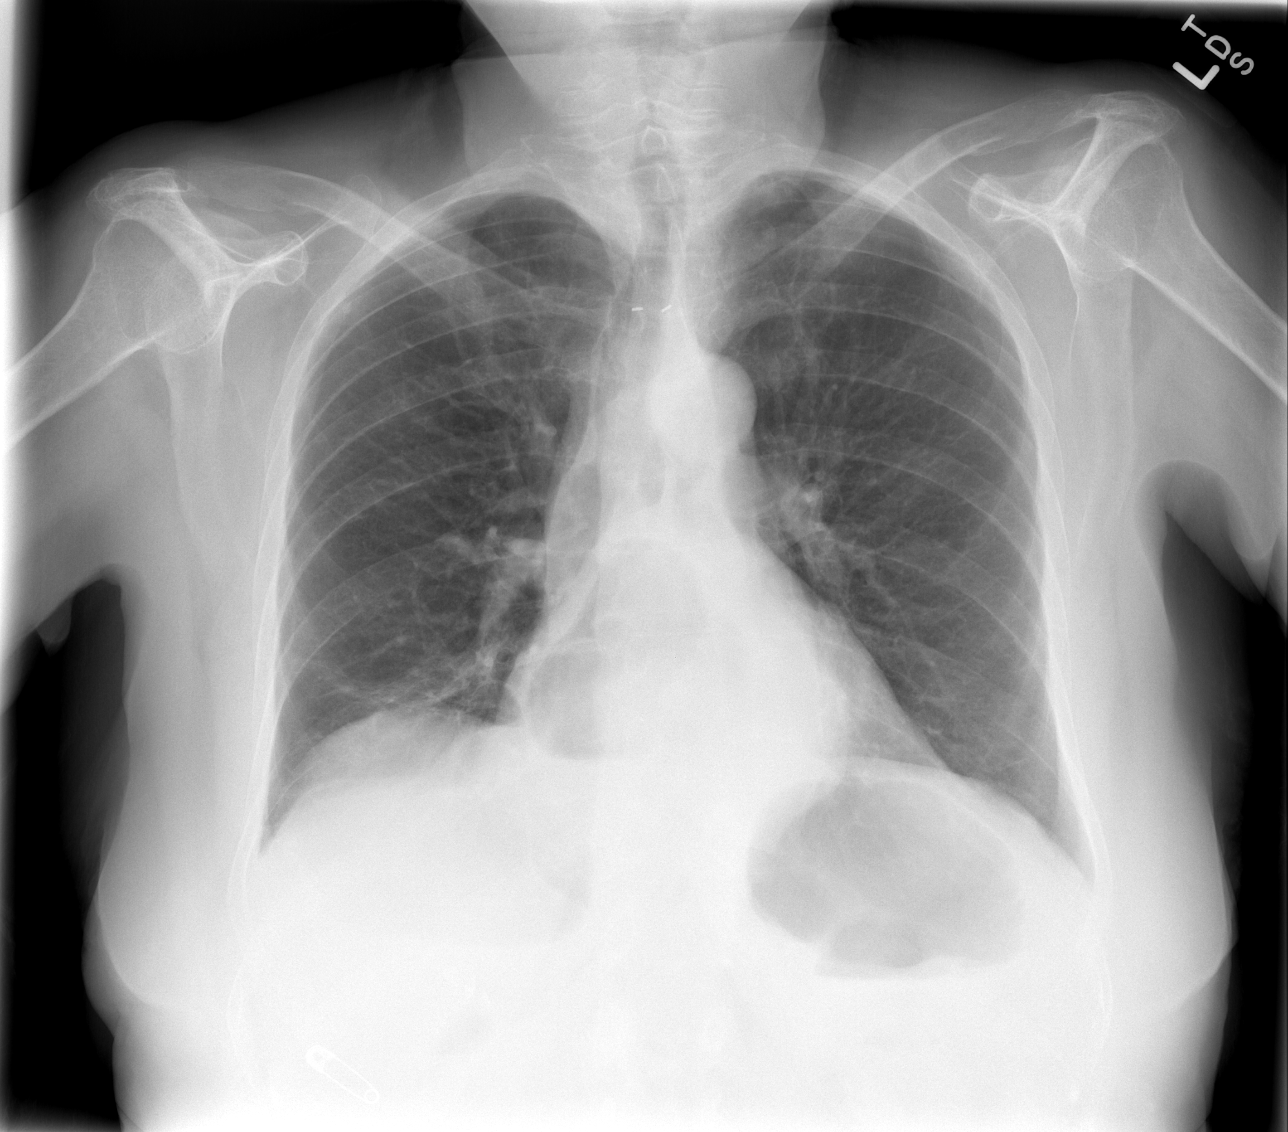

[w abdomen upright]
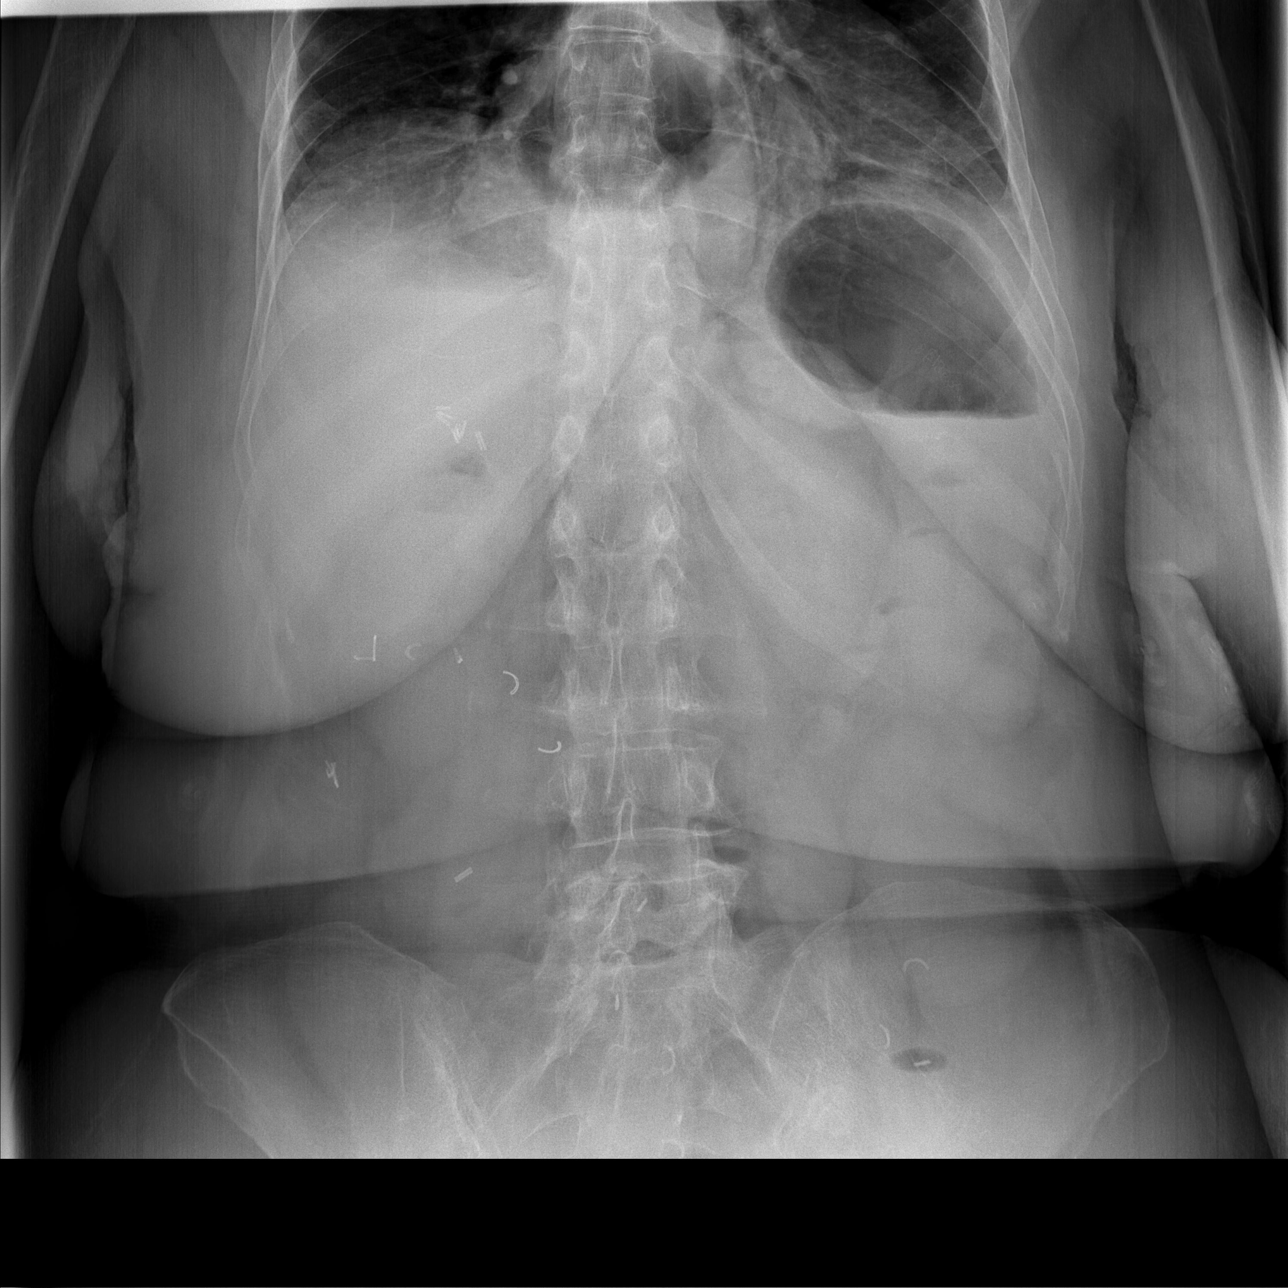

[t abdomen supine]
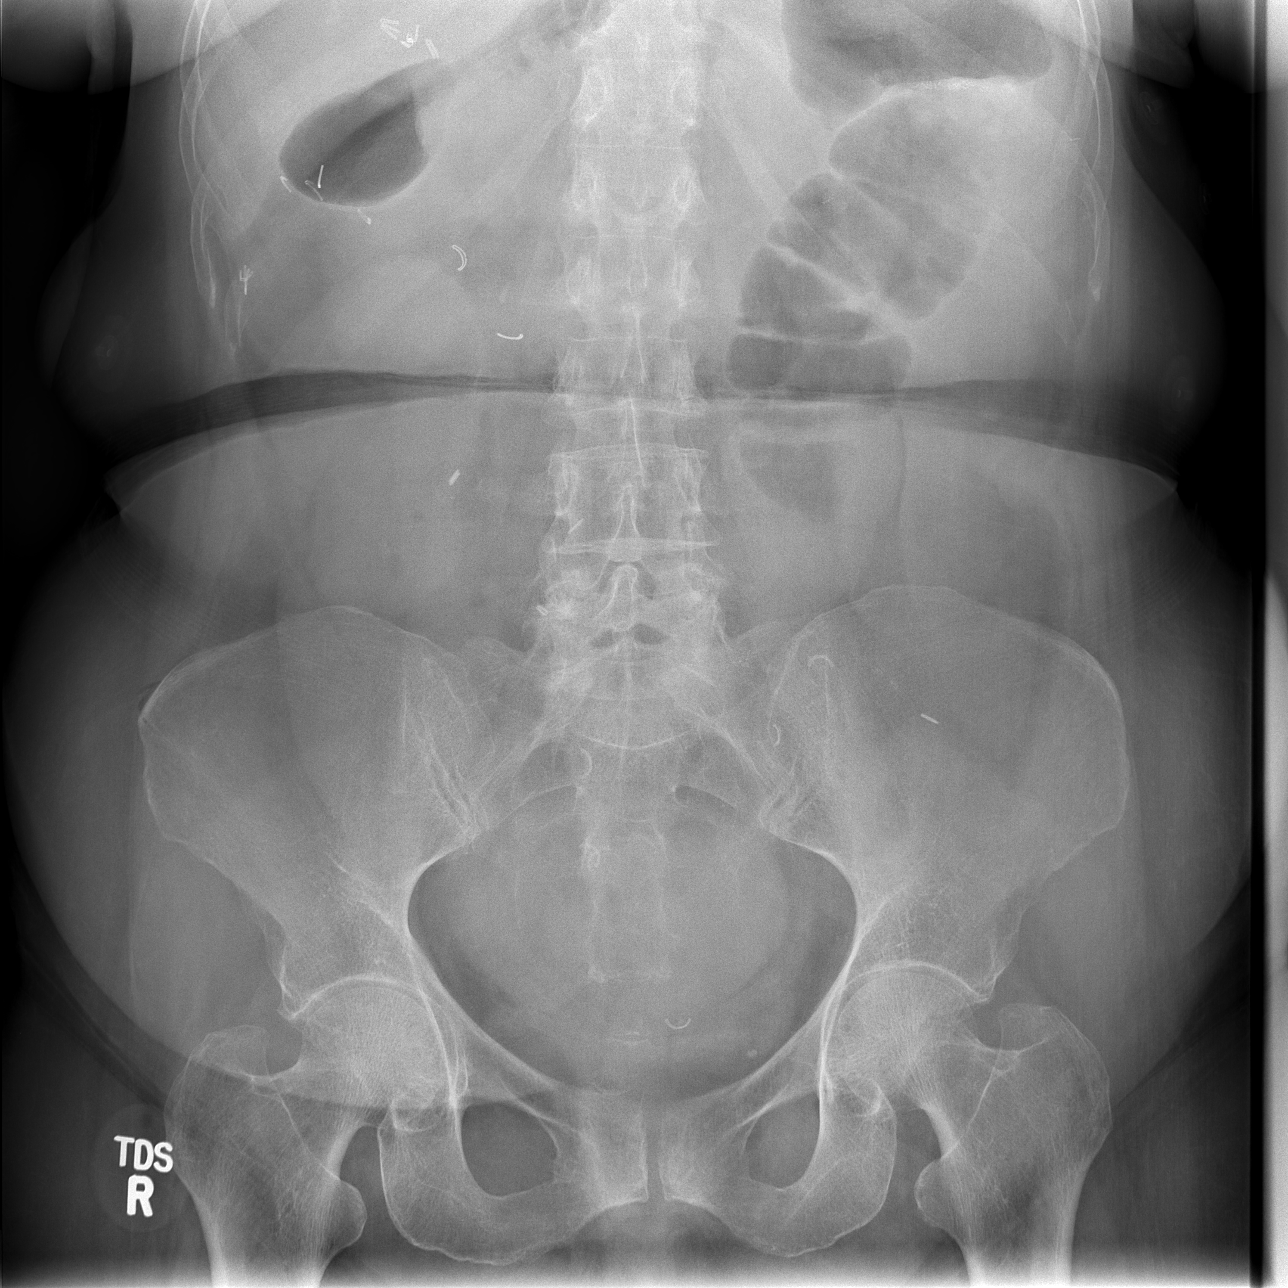

[3 of 3 positions shown; findings below may reference images not displayed]

FINDINGS: Large hiatal hernia noted.  Chronic scarring at the right
lung base.  No free air beneath hemidiaphragms.

There is interval reduction in the dilated loops of bowel seen on
comparison exam.  No evidence of gas in the rectum on prior study.
Multiple surgical clips noted in the right lower quadrant.
Cholecystectomy clips in the right upper quadrant.
IMPRESSION: 1..  Large hiatal hernia unchanged.
2.  Interval improvement in small bowel obstruction pattern
compared to prior.
3.  No evidence of intraperitoneal free air.

## 2012-12-28 MED ORDER — POTASSIUM CHLORIDE IN NACL 20-0.9 MEQ/L-% IV SOLN
INTRAVENOUS | Status: DC
  Administered 2012-12-28: 100 mL/h via INTRAVENOUS
  Administered 2012-12-29 – 2012-12-30 (×3): via INTRAVENOUS
  Filled 2012-12-28 (×7): qty 1000

## 2012-12-28 MED ORDER — DM-GUAIFENESIN ER 30-600 MG PO TB12
1.0000 | ORAL_TABLET | Freq: Two times a day (BID) | ORAL | Status: DC
  Administered 2012-12-28 – 2012-12-29 (×2): 1 via ORAL
  Administered 2012-12-29: 22:00:00 via ORAL
  Administered 2012-12-30: 1 via ORAL
  Administered 2012-12-30 – 2012-12-31 (×2): via ORAL
  Administered 2012-12-31: 1 via ORAL
  Filled 2012-12-28 (×9): qty 1

## 2012-12-28 MED ORDER — ALBUTEROL SULFATE (5 MG/ML) 0.5% IN NEBU
2.5000 mg | INHALATION_SOLUTION | Freq: Four times a day (QID) | RESPIRATORY_TRACT | Status: DC
  Administered 2012-12-28 – 2012-12-30 (×6): 2.5 mg via RESPIRATORY_TRACT
  Filled 2012-12-28 (×7): qty 0.5

## 2012-12-28 MED ORDER — PROMETHAZINE HCL 25 MG/ML IJ SOLN
12.5000 mg | Freq: Four times a day (QID) | INTRAMUSCULAR | Status: DC | PRN
  Administered 2012-12-28 – 2013-01-01 (×5): 12.5 mg via INTRAVENOUS
  Filled 2012-12-28 (×5): qty 1

## 2012-12-28 MED ORDER — POTASSIUM CHLORIDE 10 MEQ/100ML IV SOLN
10.0000 meq | INTRAVENOUS | Status: AC
  Administered 2012-12-28 (×6): 10 meq via INTRAVENOUS
  Filled 2012-12-28 (×2): qty 100

## 2012-12-28 MED ORDER — HYDROMORPHONE HCL PF 1 MG/ML IJ SOLN: 0.5000 mg | INTRAMUSCULAR | Status: DC | PRN

## 2012-12-28 MED ORDER — OXYCODONE HCL 5 MG PO TABS: 5.0000 mg | ORAL_TABLET | ORAL | Status: DC | PRN

## 2012-12-28 MED ORDER — POTASSIUM CHLORIDE 20 MEQ/15ML (10%) PO LIQD
40.0000 meq | ORAL | Status: AC
  Administered 2012-12-28: 10 meq via ORAL
  Filled 2012-12-28: qty 30

## 2012-12-28 NOTE — Progress Notes (Addendum)
[facility] Progress Note Danielle [name] 1 - Stepdown/ICU [name]   DELORIES MAURI AOZ:[id number] DOB: 07-14-1946 DOA: 12/25/2012 PCP: [name], MD  Brief narrative: 67 yo female h/o Crohn's, s/p bowel resection/colostomy which was subsequently reversed, on Remicade, hiatal hernia/GERD, polyarthralgia, chronic anemia and Goiter. She completed a course of oral antibiotics for a pneumonia about 5 weeks ago, but feels that she has not recovered from that since. She has been very weak, with persistent chills and subjective fever, non-productive cough and poor appetite. Found to have persistent infiltrate on CXR.  Assessment/Plan:  PNA RLL This appears to be persistent or recurrent. Patient presented with cough, intermittently productive of greenish phlegm, generally unwell with poor appetite, and CXR demonstrated patchy densities at the right lung base. Given the fact that patient is mmunocompromised on Remicade, managing with broad-spectrum antibiotics, Vanc/Zosyn day#3/Azithromycin day#2. Patient has remained afebrile and feels better clinically. Strept Pneumoniae antigen is negative. Today she is suffering with a lot of thick phlegm, so a flutter valve is being added.   Query Dysphagia Not clear whether patient has a history of dysphagia, although appetite is poor. SLP has evaluated, and recommended regular consistency diet.   UTI (lower urinary tract infection) Patient has a positive urinary sediment with many bacteria. Adequately covered by Zosyn. Urine culture revealed Kleb.   Dehydration/Hyponatremia/ARF Patient presented with a creatinine of 2.8, BUN 33, consistent with dehydration/ARF. Base line creatinine was 1.28 on 12/06/12. This is likely pre-renal, secondary to dehydration due to poor oral intake and diarrhea, as well as diuretic therapy. These same factors are also responsible for patient's presenting hyponatremia of 125. Diuretic is on hold, avoiding nephrotoxins, managing  with iv fluids, with improvement continuing.   Metabolic Acidosis Patient was noted to have a CO2 of 7, AG of 18. This is a high anion gap metabolic acidosis of unclear etiology, but likely multi-factorial, due to dehydration/ARF, pneumonia. Early sepsis was considered, but Procalcitonin was 0.30 and lactic acid was 1.7. Managed with iv bicarbonate infusion earlier.  W/ improvement in renal fxn, will d/c bicarb and follow.  Hypokalemia Due to diuretics, GI fluid loss, & IV bicarb. Repleting. Magnesium levels is normal.   Crohn's disease Patient has had increased stool frequency to about 4-5 bowel movements per day, (usually 2-3/day), over the last couple of weeks, and stools are watery. C. Difficile PCR is negative. Abdominal/pelvic CT showed no acute findings within the abdomen or pelvis. Have commenced Loperamide w/ improvement in sx.    Code Status: FULL Family Communication: spoke w/ pt and sister at bedside Disposition Plan: SDU additional 24hrs   Consultants: none  Procedures: none  Antibiotics: Vancomycin 12/25/12 >>> 12/28/2012 Zosyn 12/25/12 >>>  Azithromycin 12/26/12 >>>  DVT prophylaxis: lovenox  HPI/Subjective: Pt reports that she is expectorating large amounts of sputum today, and that this is causing signif nausea with some vomiting of phelgm.  She reports decrease frequency of diarrhea, no abdom pain, and no chest pain.  Her appetite remains quite poor.     Objective: Blood pressure 103/57, pulse 86, temperature 98.2 F (36.8 C), temperature source Oral, resp. rate 15, height 5' 4.5" (1.638 m), weight 61.4 kg (135 lb 5.8 oz), SpO2 96.00%.  Intake/Output Summary (Last 24 hours) at 12/28/12 1634 Last data filed at 12/28/12 1400  Gross per 24 hour  Intake   3000 ml  Output      3 ml  Net   2997 ml     Exam: General: No acute respiratory  distress at rest - frequent productive cough  Lungs: mild bibasilar crackles - no wheeze Cardiovascular: Regular rate and  rhythm without murmur gallop or rub normal S1 and S2 Abdomen: Nontender, nondistended, soft, bowel sounds positive, no rebound, no ascites, no appreciable mass Extremities: No significant cyanosis, clubbing, or edema bilateral lower extremities  Data Reviewed: Basic Metabolic Panel:  Lab 12/28/12 1610 12/28/12 0449 12/27/12 1346 12/27/12 0440 12/26/12 1150 12/25/12 1844  NA -- 135 134* 135 132* 125*  K 2.6* 2.4* 3.0* 2.8* 3.0* --  CL -- 104 106 108 107 98  CO2 -- 16* 13* 9* 7* 8*  GLUCOSE -- 100* 118* 100* 107* 92  BUN -- 13 20 26* 31* 33*  CREATININE -- 1.64* 1.94* 2.18* 2.37* 2.28*  CALCIUM -- 7.8* 8.4 8.8 9.2 9.6  MG -- 1.3* -- 1.9 -- --  PHOS -- 1.8* -- -- -- --   Liver Function Tests:  Lab 12/27/12 0440 12/25/12 1844  AST 20 43*  ALT 49* 89*  ALKPHOS 99 135*  BILITOT 0.4 0.3  PROT 7.6 9.6*  ALBUMIN 2.8* 3.5   CBC:  Lab 12/28/12 0449 12/26/12 1152 12/25/12 1844  WBC 11.3* 14.3* 16.3*  NEUTROABS -- -- 10.4*  HGB 9.4* 11.8* 13.5  HCT 26.5* 34.5* 38.6  MCV 79.3 81.4 81.3  PLT 347 477* 597*    Recent Results (from the past 240 hour(s))  URINE CULTURE     Status: Normal   Collection Time   12/25/12  9:03 PM      Component Value Range Status Comment   Specimen Description URINE, RANDOM   Final    Special Requests ADDED 2122   Final    Culture  Setup Time 12/25/2012 23:25   Final    Colony Count 50,000 COLONIES/ML   Final    Culture KLEBSIELLA PNEUMONIAE   Final    Report Status 12/27/2012 FINAL   Final    Organism ID, Bacteria KLEBSIELLA PNEUMONIAE   Final   CULTURE, BLOOD (ROUTINE X 2)     Status: Normal (Preliminary result)   Collection Time   12/25/12 10:13 PM      Component Value Range Status Comment   Specimen Description BLOOD RIGHT ARM   Final    Special Requests BOTTLES DRAWN AEROBIC ONLY 10CC   Final    Culture  Setup Time 12/26/2012 02:04   Final    Culture     Final    Value:        BLOOD CULTURE RECEIVED NO GROWTH TO DATE CULTURE WILL BE HELD FOR 5  DAYS BEFORE ISSUING A FINAL NEGATIVE REPORT   Report Status PENDING   Incomplete   CULTURE, BLOOD (ROUTINE X 2)     Status: Normal (Preliminary result)   Collection Time   12/25/12 10:22 PM      Component Value Range Status Comment   Specimen Description BLOOD RIGHT HAND   Final    Special Requests BOTTLES DRAWN AEROBIC ONLY 1CC   Final    Culture  Setup Time 12/26/2012 02:04   Final    Culture     Final    Value:        BLOOD CULTURE RECEIVED NO GROWTH TO DATE CULTURE WILL BE HELD FOR 5 DAYS BEFORE ISSUING A FINAL NEGATIVE REPORT   Report Status PENDING   Incomplete   CULTURE, EXPECTORATED SPUTUM-ASSESSMENT     Status: Normal   Collection Time   12/25/12 11:52 PM  Component Value Range Status Comment   Specimen Description SPUTUM   Final    Special Requests Immunocompromised   Final    Sputum evaluation     Final    Value: THIS SPECIMEN IS ACCEPTABLE. RESPIRATORY CULTURE REPORT TO FOLLOW.   Report Status 12/26/2012 FINAL   Final   CULTURE, RESPIRATORY     Status: Normal   Collection Time   12/25/12 11:52 PM      Component Value Range Status Comment   Specimen Description SPUTUM   Final    Special Requests ADDED 0104   Final    Gram Stain     Final    Value: FEW WBC PRESENT, PREDOMINANTLY PMN     FEW SQUAMOUS EPITHELIAL CELLS PRESENT     RARE GRAM POSITIVE COCCI IN PAIRS   Culture NORMAL OROPHARYNGEAL FLORA   Final    Report Status 12/28/2012 FINAL   Final   CLOSTRIDIUM DIFFICILE BY PCR     Status: Normal   Collection Time   12/26/12  5:50 PM      Component Value Range Status Comment   C difficile by pcr NEGATIVE  NEGATIVE Final   MRSA PCR SCREENING     Status: Normal   Collection Time   12/26/12  7:52 PM      Component Value Range Status Comment   MRSA by PCR NEGATIVE  NEGATIVE Final      Studies:  Recent x-ray studies have been reviewed in detail by the Attending Physician  Scheduled Meds:  Reviewed in detail by the Attending Physician   Lonia Blood,  MD [facility] Office  [phone] Pager [phone]  On-Call/Text Page:      [email or web address]      password [email or web address]  If 7PM-7AM, please contact night-coverage [email or web address] Password [facility] 12/28/2012, 4:34 PM   LOS: 3 days

## 2012-12-28 NOTE — Progress Notes (Signed)
CRITICAL VALUE ALERT   Critical value received:  K 2.4  Date of notification:  12/28/2012  Time of notification:  0552  Critical value read back:yes  Nurse who received alert:  Maximino Greenland  MD notified (1st page):  NP K.Kirby  Time of first page:  0554  MD notified (2nd page): NP Donnamarie Poag  Time of second page: [phone]  Responding MD:  NP Donnamarie Poag  Time MD responded:  [phone]

## 2012-12-28 NOTE — Progress Notes (Signed)
Pt vomited immediately after drinking 1 20 mEq/73mL. Took half of the other 50mEq/15mL and then refused to take the rest, stating " there is no need to make myself get sick." Will notify day-shift RN to readdress.  [place], [place], Charity fundraiser

## 2012-12-28 NOTE — Progress Notes (Signed)
ANTIBIOTIC CONSULT NOTE - INITIAL  Pharmacy Consult for Vancomycin + zosyn Indication: rule out pneumonia  Allergies  Allergen Reactions  . Aspirin Other (See Comments)    Cramps her stomach  . Sulfa Antibiotics Nausea And Vomiting    Patient Measurements: Height: 5' 4.5" (163.8 cm) Weight: 135 lb 5.8 oz (61.4 kg) IBW/kg (Calculated) : 55.85  Adjusted Body Weight: 72 kg  Vital Signs: Temp: 98.2 F (36.8 C) (01/22 0700) Temp src: Oral (01/22 0700) BP: 90/49 mmHg (01/22 0350) Pulse Rate: 90  (01/22 0350) Intake/Output from previous day: 01/21 0701 - 01/22 0700 In: 3900 [P.O.:700; I.V.:2500; IV Piggyback:700] Out: -  Intake/Output from this shift:    Labs:  Basename 12/28/12 0449 12/27/12 1346 12/27/12 0440 12/26/12 1152 12/25/12 1844  WBC 11.3* -- -- 14.3* 16.3*  HGB 9.4* -- -- 11.8* 13.5  PLT 347 -- -- 477* 597*  LABCREA -- -- -- -- --  CREATININE 1.64* 1.94* 2.18* -- --   Estimated Creatinine Clearance: 30.2 ml/min (by C-G formula based on Cr of 1.64). No results found for this basename: VANCOTROUGH:2,VANCOPEAK:2,VANCORANDOM:2,GENTTROUGH:2,GENTPEAK:2,GENTRANDOM:2,TOBRATROUGH:2,TOBRAPEAK:2,TOBRARND:2,AMIKACINPEAK:2,AMIKACINTROU:2,AMIKACIN:2, in the last 72 hours   Microbiology: Recent Results (from the past 720 hour(s))  URINE CULTURE     Status: Normal   Collection Time   12/25/12  9:03 PM      Component Value Range Status Comment   Specimen Description URINE, RANDOM   Final    Special Requests ADDED 2122   Final    Culture  Setup Time 12/25/2012 23:25   Final    Colony Count 50,000 COLONIES/ML   Final    Culture KLEBSIELLA PNEUMONIAE   Final    Report Status 12/27/2012 FINAL   Final    Organism ID, Bacteria KLEBSIELLA PNEUMONIAE   Final   CULTURE, BLOOD (ROUTINE X 2)     Status: Normal (Preliminary result)   Collection Time   12/25/12 10:13 PM      Component Value Range Status Comment   Specimen Description BLOOD RIGHT ARM   Final    Special Requests  BOTTLES DRAWN AEROBIC ONLY 10CC   Final    Culture  Setup Time 12/26/2012 02:04   Final    Culture     Final    Value:        BLOOD CULTURE RECEIVED NO GROWTH TO DATE CULTURE WILL BE HELD FOR 5 DAYS BEFORE ISSUING A FINAL NEGATIVE REPORT   Report Status PENDING   Incomplete   CULTURE, BLOOD (ROUTINE X 2)     Status: Normal (Preliminary result)   Collection Time   12/25/12 10:22 PM      Component Value Range Status Comment   Specimen Description BLOOD RIGHT HAND   Final    Special Requests BOTTLES DRAWN AEROBIC ONLY 1CC   Final    Culture  Setup Time 12/26/2012 02:04   Final    Culture     Final    Value:        BLOOD CULTURE RECEIVED NO GROWTH TO DATE CULTURE WILL BE HELD FOR 5 DAYS BEFORE ISSUING A FINAL NEGATIVE REPORT   Report Status PENDING   Incomplete   CULTURE, EXPECTORATED SPUTUM-ASSESSMENT     Status: Normal   Collection Time   12/25/12 11:52 PM      Component Value Range Status Comment   Specimen Description SPUTUM   Final    Special Requests Immunocompromised   Final    Sputum evaluation     Final    Value: THIS  SPECIMEN IS ACCEPTABLE. RESPIRATORY CULTURE REPORT TO FOLLOW.   Report Status 12/26/2012 FINAL   Final   CULTURE, RESPIRATORY     Status: Normal   Collection Time   12/25/12 11:52 PM      Component Value Range Status Comment   Specimen Description SPUTUM   Final    Special Requests ADDED 0104   Final    Gram Stain     Final    Value: FEW WBC PRESENT, PREDOMINANTLY PMN     FEW SQUAMOUS EPITHELIAL CELLS PRESENT     RARE GRAM POSITIVE COCCI IN PAIRS   Culture NORMAL OROPHARYNGEAL FLORA   Final    Report Status 12/28/2012 FINAL   Final   CLOSTRIDIUM DIFFICILE BY PCR     Status: Normal   Collection Time   12/26/12  5:50 PM      Component Value Range Status Comment   C difficile by pcr NEGATIVE  NEGATIVE Final   MRSA PCR SCREENING     Status: Normal   Collection Time   12/26/12  7:52 PM      Component Value Range Status Comment   MRSA by PCR NEGATIVE  NEGATIVE  Final     Assessment: SOB, emesis, weakness Patient previously treatment with abx (completed 1 week ago) for PNA. Presented with SOB, vomiting, weakness, diarrhea. Patient has known h/o Crohn's dz with chronic diarrhea. She was initiated on vancomycin + zosyn + azithromycin. She is afebrile and WBC is trending down. Doses remain appropriate but renal fxn is improving. Vancomycin may require dose adjustment soon if renal fxn continues to improve.   Pt is growing klebsiella in the urine which is sensitive to everything except nitrofurantoin and ampicillin. All other cultures are negative thus far.   Goal of Therapy:  Vancomycin trough level 15-20 mcg/ml  Plan:  1. Continue zosyn 3.375gm IV Q8H (4 hr inf) 2. Continue vancomycin 750mg  IV Q24H 3. Continue azithromycin as ordered 4. F/u renal fxn, C&S, clinical status and trough at SS 5. Consider de-escalating antibiotics as soon as able  Lysle Pearl, PharmD, BCPS Pager # [phone] 12/28/2012 8:30 AM

## 2012-12-29 ENCOUNTER — Inpatient Hospital Stay (HOSPITAL_COMMUNITY): Payer: Medicare Other

## 2012-12-29 LAB — BASIC METABOLIC PANEL
BUN: 10 mg/dL (ref 6–23)
Chloride: 106 mEq/L (ref 96–112)
Creatinine, Ser: 1.37 mg/dL — ABNORMAL HIGH (ref 0.50–1.10)
Glucose, Bld: 81 mg/dL (ref 70–99)
Potassium: 3.3 mEq/L — ABNORMAL LOW (ref 3.5–5.1)

## 2012-12-29 LAB — CBC
HCT: 25.9 % — ABNORMAL LOW (ref 36.0–46.0)
Hemoglobin: 9.1 g/dL — ABNORMAL LOW (ref 12.0–15.0)
MCH: 28.3 pg (ref 26.0–34.0)
MCHC: 35.1 g/dL (ref 30.0–36.0)

## 2012-12-29 MED ORDER — ALIGN PO CAPS: 1.0000 | ORAL_CAPSULE | Freq: Every day | ORAL | Status: DC

## 2012-12-29 MED ORDER — MAGNESIUM SULFATE 50 % IJ SOLN
3.0000 g | Freq: Once | INTRAVENOUS | Status: AC
  Administered 2012-12-29: 3 g via INTRAVENOUS
  Filled 2012-12-29: qty 6

## 2012-12-29 MED ORDER — POTASSIUM CHLORIDE 10 MEQ/100ML IV SOLN
10.0000 meq | INTRAVENOUS | Status: AC
  Administered 2012-12-29 (×4): 10 meq via INTRAVENOUS
  Filled 2012-12-29 (×4): qty 100

## 2012-12-29 MED ORDER — LEVOFLOXACIN 750 MG PO TABS
750.0000 mg | ORAL_TABLET | ORAL | Status: DC
  Administered 2012-12-29 – 2012-12-31 (×2): 750 mg via ORAL
  Filled 2012-12-29 (×2): qty 1

## 2012-12-29 MED ORDER — WHITE PETROLATUM GEL
Status: AC
  Administered 2012-12-29: 23:00:00
  Filled 2012-12-29: qty 5

## 2012-12-29 MED ORDER — DICYCLOMINE HCL 10 MG/5ML PO SOLN
15.0000 mg | Freq: Three times a day (TID) | ORAL | Status: DC
  Administered 2012-12-31: 15 mg via ORAL
  Filled 2012-12-29 (×14): qty 7.5

## 2012-12-29 MED ORDER — ZOLPIDEM TARTRATE 5 MG PO TABS
5.0000 mg | ORAL_TABLET | ORAL | Status: DC | PRN
  Administered 2012-12-29: 5 mg via ORAL
  Filled 2012-12-29: qty 1

## 2012-12-29 MED ORDER — SACCHAROMYCES BOULARDII 250 MG PO CAPS
250.0000 mg | ORAL_CAPSULE | Freq: Every day | ORAL | Status: DC
  Administered 2012-12-30 – 2013-01-01 (×2): 250 mg via ORAL
  Filled 2012-12-29 (×3): qty 1

## 2012-12-29 NOTE — Progress Notes (Signed)
[facility] Progress Note [place] TEAM 1 - Stepdown/ICU TEAM   DEAJAH ERKKILA WUJ:[id number] DOB: [birth year] DOA: 12/25/2012 PCP: [name], MD  Brief narrative: 67 yo female h/o Crohn's, s/p bowel resection/colostomy which was subsequently reversed, on Remicade, hiatal hernia/GERD, polyarthralgia, chronic anemia and Goiter. She completed a course of oral antibiotics for a pneumonia about 5 weeks ago, but feels that she has not recovered from that since. She has been very weak, with persistent chills and subjective fever, non-productive cough and poor appetite. Found to have persistent infiltrate on CXR.  Assessment/Plan:  PNA RLL This appears to be persistent or recurrent.  Patient presented with cough, intermittently productive of greenish phlegm, generally unwell with poor appetite, and CXR demonstrated patchy densities at the right lung base.  Given the fact that patient is immunocompromised on Remicade, managing with broad-spectrum antibiotics, Vanc/Zosyn day#3/Azithromycin day#2. Patient has remained afebrile and feels better clinically. Strept Pneumoniae antigen is negative. Today she is suffering with a lot of thick phlegm, so a flutter valve was added.   Query Dysphagia Not clear whether patient has a history of dysphagia- SLP has evaluated, and recommended regular consistency diet.   UTI (lower urinary tract infection) Patient has a positive urinary sediment with many bacteria. Adequately covered by Zosyn. Urine culture revealed Kleb.   Dehydration/Hyponatremia/ARF Patient presented with a creatinine of 2.8, BUN 33, consistent with dehydration/ARF. Base line creatinine was 1.28 on 12/06/12.  This is likely pre-renal, secondary to dehydration due to poor oral intake and diarrhea, as well as diuretic therapy.  These same factors are also responsible for patient's presenting hyponatremia of 125.  Diuretic is on hold, avoiding nephrotoxins, managing with iv fluids, with  improvement continuing.   Metabolic Acidosis Patient was noted to have a CO2 of 7, AG of 18. This is a high anion gap metabolic acidosis likely multi-factorial, due to dehydration/ARF, pneumonia. Early sepsis was considered, but Procalcitonin was 0.30 and lactic acid was 1.7. Managed with iv bicarbonate infusion earlier.  Bicarbonate infusion has been discontinued. Bicarbonate continues to improve.  Hypokalemia/hypomagnesemia Due to diuretics initially, now due to GI fluid loss.  Repleting. Magnesium levels is also low and is being repleted  Diarrhea history of Crohn disease Pain in abdomen is not typical to the patient's Crohn's flare.  C. difficile is negative. Imodium has not been effective.  We'll check fecal lactoferrin.  Diarrhea may be secondary to antibiotics  Crohn's disease Patient has had increased stool frequency to about 4-5 bowel movements per day, (usually 2-3/day), over the last couple of weeks, and stools are watery. C. Difficile PCR is negative. Abdominal/pelvic CT showed no acute findings within the abdomen or pelvis. Have commenced Loperamide w/ improvement in sx.    Code Status: FULL Family Communication: spoke w/ pt and sister at bedside Disposition Plan: Transfer to med surge  Consultants: none  Procedures: none  Antibiotics: Vancomycin 12/25/12 >>> 12/28/2012 Zosyn 12/25/12 >>>  Azithromycin 12/26/12 >>>  DVT prophylaxis: lovenox  HPI/Subjective: Pt states she is feeling much better as far as pulmonary status is concerned. She continues to have diarrhea without any blood -RN and states that there is mucus present  She can ambulate to the commode without getting dyspneic.   Objective: Blood pressure 89/48, pulse 79, temperature 97.8 F (36.6 C), temperature source Oral, resp. rate 22, height 5' 4.5" (1.638 m), weight 61.4 kg (135 lb 5.8 oz), SpO2 100.00%.  Intake/Output Summary (Last 24 hours) at 12/29/12 1608 Last data filed at 12/29/12 0600  Gross  per 24 hour  Intake 2206.67 ml  Output      0 ml  Net 2206.67 ml     Exam: General: No acute respiratory distress at rest - Lungs: Coarse breath sounds in right lower lobe - no wheeze no rhonchi Cardiovascular: Regular rate and rhythm without murmur gallop or rub normal S1 and S2 Abdomen: Tender in left lower quadrant, nondistended, soft, bowel sounds positive, no rebound, no ascites, no appreciable mass Extremities: No significant cyanosis, clubbing, or edema bilateral lower extremities  Data Reviewed: Basic Metabolic Panel:  Lab 12/29/12 1191 12/28/12 1445 12/28/12 0449 12/27/12 1346 12/27/12 0440 12/26/12 1150  NA 136 -- 135 134* 135 132*  K 3.3* 2.6* 2.4* 3.0* 2.8* --  CL 106 -- 104 106 108 107  CO2 18* -- 16* 13* 9* 7*  GLUCOSE 81 -- 100* 118* 100* 107*  BUN 10 -- 13 20 26* 31*  CREATININE 1.37* -- 1.64* 1.94* 2.18* 2.37*  CALCIUM 7.6* -- 7.8* 8.4 8.8 9.2  MG 1.2* -- 1.3* -- 1.9 --  PHOS -- -- 1.8* -- -- --   Liver Function Tests:  Lab 12/27/12 0440 12/25/12 1844  AST 20 43*  ALT 49* 89*  ALKPHOS 99 135*  BILITOT 0.4 0.3  PROT 7.6 9.6*  ALBUMIN 2.8* 3.5   CBC:  Lab 12/29/12 0440 12/28/12 0449 12/26/12 1152 12/25/12 1844  WBC 9.1 11.3* 14.3* 16.3*  NEUTROABS -- -- -- 10.4*  HGB 9.1* 9.4* 11.8* 13.5  HCT 25.9* 26.5* 34.5* 38.6  MCV 80.7 79.3 81.4 81.3  PLT 286 347 477* 597*    Recent Results (from the past 240 hour(s))  URINE CULTURE     Status: Normal   Collection Time   12/25/12  9:03 PM      Component Value Range Status Comment   Specimen Description URINE, RANDOM   Final    Special Requests ADDED 2122   Final    Culture  Setup Time 12/25/2012 23:25   Final    Colony Count 50,000 COLONIES/ML   Final    Culture KLEBSIELLA PNEUMONIAE   Final    Report Status 12/27/2012 FINAL   Final    Organism ID, Bacteria KLEBSIELLA PNEUMONIAE   Final   CULTURE, BLOOD (ROUTINE X 2)     Status: Normal (Preliminary result)   Collection Time   12/25/12 10:13 PM       Component Value Range Status Comment   Specimen Description BLOOD RIGHT ARM   Final    Special Requests BOTTLES DRAWN AEROBIC ONLY 10CC   Final    Culture  Setup Time 12/26/2012 02:04   Final    Culture     Final    Value:        BLOOD CULTURE RECEIVED NO GROWTH TO DATE CULTURE WILL BE HELD FOR 5 DAYS BEFORE ISSUING A FINAL NEGATIVE REPORT   Report Status PENDING   Incomplete   CULTURE, BLOOD (ROUTINE X 2)     Status: Normal (Preliminary result)   Collection Time   12/25/12 10:22 PM      Component Value Range Status Comment   Specimen Description BLOOD RIGHT HAND   Final    Special Requests BOTTLES DRAWN AEROBIC ONLY 1CC   Final    Culture  Setup Time 12/26/2012 02:04   Final    Culture     Final    Value:        BLOOD CULTURE RECEIVED NO GROWTH TO DATE CULTURE WILL  BE HELD FOR 5 DAYS BEFORE ISSUING A FINAL NEGATIVE REPORT   Report Status PENDING   Incomplete   CULTURE, EXPECTORATED SPUTUM-ASSESSMENT     Status: Normal   Collection Time   12/25/12 11:52 PM      Component Value Range Status Comment   Specimen Description SPUTUM   Final    Special Requests Immunocompromised   Final    Sputum evaluation     Final    Value: THIS SPECIMEN IS ACCEPTABLE. RESPIRATORY CULTURE REPORT TO FOLLOW.   Report Status 12/26/2012 FINAL   Final   CULTURE, RESPIRATORY     Status: Normal   Collection Time   12/25/12 11:52 PM      Component Value Range Status Comment   Specimen Description SPUTUM   Final    Special Requests ADDED 0104   Final    Gram Stain     Final    Value: FEW WBC PRESENT, PREDOMINANTLY PMN     FEW SQUAMOUS EPITHELIAL CELLS PRESENT     RARE GRAM POSITIVE COCCI IN PAIRS   Culture NORMAL OROPHARYNGEAL FLORA   Final    Report Status 12/28/2012 FINAL   Final   CLOSTRIDIUM DIFFICILE BY PCR     Status: Normal   Collection Time   12/26/12  5:50 PM      Component Value Range Status Comment   C difficile by pcr NEGATIVE  NEGATIVE Final   MRSA PCR SCREENING     Status: Normal   Collection  Time   12/26/12  7:52 PM      Component Value Range Status Comment   MRSA by PCR NEGATIVE  NEGATIVE Final      Studies:  Recent x-ray studies have been reviewed in detail by the Attending Physician  Scheduled Meds:  Reviewed in detail by the Attending Physician   Calvert Cantor, MD [phone]  If 7PM-7AM, please contact night-coverage [email or web address] Password [email or web address] 12/29/2012, 4:08 PM   LOS: 4 days

## 2012-12-29 NOTE — Progress Notes (Signed)
Pt transferred to 5N30 in wheelchair with RN, no telemetry ordered. Pt able to move self from chair to bed with one assist. Receiving RN at bedside.

## 2012-12-29 NOTE — Progress Notes (Signed)
ANTIBIOTIC CONSULT NOTE - Follow-up  Pharmacy Consult for levofloxacin Indication: rule out pneumonia, UTI  Allergies  Allergen Reactions  . Aspirin Other (See Comments)    Cramps her stomach  . Sulfa Antibiotics Nausea And Vomiting    Patient Measurements: Height: 5' 4.5" (163.8 cm) Weight: 135 lb 5.8 oz (61.4 kg) IBW/kg (Calculated) : 55.85  Adjusted Body Weight: 72 kg  Vital Signs: Temp: 97.8 F (36.6 C) (01/23 1100) Temp src: Oral (01/23 1100) BP: 88/49 mmHg (01/23 0430) Pulse Rate: 86  (01/23 0430) Intake/Output from previous day: 01/22 0701 - 01/23 0700 In: 3286.7 [P.O.:920; I.V.:2216.7; IV Piggyback:150] Out: 203 [Urine:200; Emesis/NG output:3] Intake/Output from this shift:    Labs:  Basename 12/29/12 0440 12/28/12 0449 12/27/12 1346  WBC 9.1 11.3* --  HGB 9.1* 9.4* --  PLT 286 347 --  LABCREA -- -- --  CREATININE 1.37* 1.64* 1.94*   Estimated Creatinine Clearance: 36.1 ml/min (by C-G formula based on Cr of 1.37). No results found for this basename: VANCOTROUGH:2,VANCOPEAK:2,VANCORANDOM:2,GENTTROUGH:2,GENTPEAK:2,GENTRANDOM:2,TOBRATROUGH:2,TOBRAPEAK:2,TOBRARND:2,AMIKACINPEAK:2,AMIKACINTROU:2,AMIKACIN:2, in the last 72 hours   Microbiology: Recent Results (from the past 720 hour(s))  URINE CULTURE     Status: Normal   Collection Time   12/25/12  9:03 PM      Component Value Range Status Comment   Specimen Description URINE, RANDOM   Final    Special Requests ADDED 2122   Final    Culture  Setup Time 12/25/2012 23:25   Final    Colony Count 50,000 COLONIES/ML   Final    Culture KLEBSIELLA PNEUMONIAE   Final    Report Status 12/27/2012 FINAL   Final    Organism ID, Bacteria KLEBSIELLA PNEUMONIAE   Final   CULTURE, BLOOD (ROUTINE X 2)     Status: Normal (Preliminary result)   Collection Time   12/25/12 10:13 PM      Component Value Range Status Comment   Specimen Description BLOOD RIGHT ARM   Final    Special Requests BOTTLES DRAWN AEROBIC ONLY 10CC    Final    Culture  Setup Time 12/26/2012 02:04   Final    Culture     Final    Value:        BLOOD CULTURE RECEIVED NO GROWTH TO DATE CULTURE WILL BE HELD FOR 5 DAYS BEFORE ISSUING A FINAL NEGATIVE REPORT   Report Status PENDING   Incomplete   CULTURE, BLOOD (ROUTINE X 2)     Status: Normal (Preliminary result)   Collection Time   12/25/12 10:22 PM      Component Value Range Status Comment   Specimen Description BLOOD RIGHT HAND   Final    Special Requests BOTTLES DRAWN AEROBIC ONLY 1CC   Final    Culture  Setup Time 12/26/2012 02:04   Final    Culture     Final    Value:        BLOOD CULTURE RECEIVED NO GROWTH TO DATE CULTURE WILL BE HELD FOR 5 DAYS BEFORE ISSUING A FINAL NEGATIVE REPORT   Report Status PENDING   Incomplete   CULTURE, EXPECTORATED SPUTUM-ASSESSMENT     Status: Normal   Collection Time   12/25/12 11:52 PM      Component Value Range Status Comment   Specimen Description SPUTUM   Final    Special Requests Immunocompromised   Final    Sputum evaluation     Final    Value: THIS SPECIMEN IS ACCEPTABLE. RESPIRATORY CULTURE REPORT TO FOLLOW.   Report Status 12/26/2012  FINAL   Final   CULTURE, RESPIRATORY     Status: Normal   Collection Time   12/25/12 11:52 PM      Component Value Range Status Comment   Specimen Description SPUTUM   Final    Special Requests ADDED 0104   Final    Gram Stain     Final    Value: FEW WBC PRESENT, PREDOMINANTLY PMN     FEW SQUAMOUS EPITHELIAL CELLS PRESENT     RARE GRAM POSITIVE COCCI IN PAIRS   Culture NORMAL OROPHARYNGEAL FLORA   Final    Report Status 12/28/2012 FINAL   Final   CLOSTRIDIUM DIFFICILE BY PCR     Status: Normal   Collection Time   12/26/12  5:50 PM      Component Value Range Status Comment   C difficile by pcr NEGATIVE  NEGATIVE Final   MRSA PCR SCREENING     Status: Normal   Collection Time   12/26/12  7:52 PM      Component Value Range Status Comment   MRSA by PCR NEGATIVE  NEGATIVE Final     Assessment: SOB, emesis,  weakness Patient previously treatment with abx (completed 1 week ago) for PNA. Presented with SOB, vomiting, weakness, diarrhea. Patient has known h/o Crohn's dz with chronic diarrhea. She was initiated on vancomycin + zosyn + azithromycin. Vancomycin was stopped yesterday. She remains afebrile and WBC is WNL.   Pt is growing klebsiella in the urine which is sensitive to everything except nitrofurantoin and ampicillin. All other cultures are negative thus far.   Goal of Therapy:  Eradication of infection  Plan:  1. Levaquin 750mg  PO Q48H 2. F/u renal fxn, C&S, clinical status   Lysle Pearl, PharmD, BCPS Pager # [phone] 12/29/2012 1:35 PM

## 2012-12-30 DIAGNOSIS — N390 Urinary tract infection, site not specified: Secondary | ICD-10-CM

## 2012-12-30 LAB — BASIC METABOLIC PANEL
Chloride: 110 mEq/L (ref 96–112)
GFR calc Af Amer: 64 mL/min — ABNORMAL LOW (ref >90)
Potassium: 4.8 mEq/L (ref 3.5–5.1)

## 2012-12-30 LAB — MAGNESIUM: Magnesium: 1.7 mg/dL (ref 1.5–2.5)

## 2012-12-30 IMAGING — CR DG ABDOMEN 1V
1 series · 1 of 1 positions shown · non-contrast
Comparison: 12/15/2011 and CT 12/10/2011

CLINICAL DATA: Small bowel obstruction. Chron's disease with right
abdominal pain

ABDOMEN - 1 VIEW

[t abdomen supine]
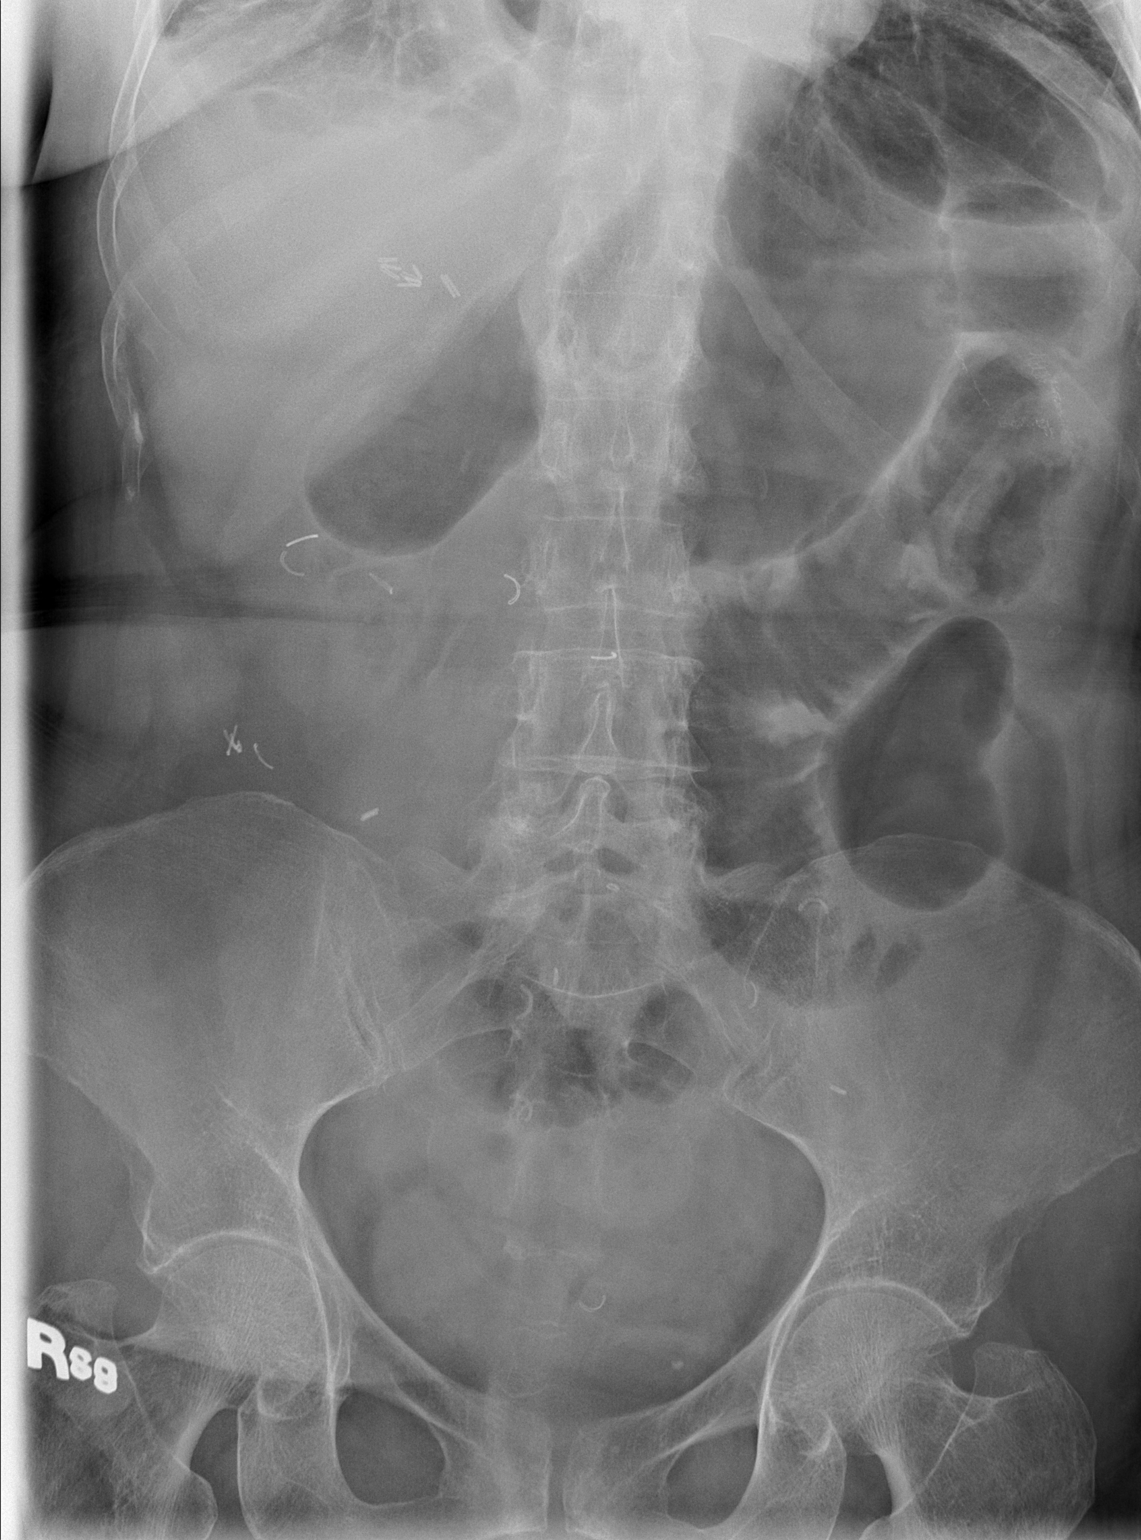

[1 of 1 positions shown; findings below may reference images not displayed]

FINDINGS: Surgical clips are identified throughout the abdomen with
a surgical suture line is seen in the left mid abdomen.  Mild
distention of a focal small bowel loop is seen in the left mid
abdomen.  Colonic air in the transverse and proximal descending
portions of the colon with no distal colonic air is seen.  An
increase in the degree of proximal colonic air is identified in
comparison with the recent KUB and the appearance of mildly dilated
small bowel loop suggest the possibility of interval worsening in
the small bowel obstructive pattern.

No free intraperitoneal air is suggested and bony structures are
stable.
IMPRESSION: Increasing colonic air and new focal small bowel loop distention
suspicious for worsening small bowel obstruction

## 2012-12-30 MED ORDER — ZOLPIDEM TARTRATE 5 MG PO TABS
5.0000 mg | ORAL_TABLET | Freq: Every evening | ORAL | Status: DC | PRN
  Administered 2012-12-30: 5 mg via ORAL
  Filled 2012-12-30: qty 1

## 2012-12-30 MED ORDER — ENOXAPARIN SODIUM 40 MG/0.4ML ~~LOC~~ SOLN
40.0000 mg | SUBCUTANEOUS | Status: DC
  Administered 2012-12-31 – 2013-01-01 (×2): 40 mg via SUBCUTANEOUS
  Filled 2012-12-30 (×3): qty 0.4

## 2012-12-30 MED ORDER — ZOLPIDEM TARTRATE 5 MG PO TABS: 10.0000 mg | ORAL_TABLET | Freq: Every evening | ORAL | Status: DC | PRN

## 2012-12-30 NOTE — Progress Notes (Signed)
Utilization review completed. Koraline Phillipson, RN, BSN. 

## 2012-12-30 NOTE — Progress Notes (Signed)
PT Cancellation Note  Patient Details Name: EVY LUTTERMAN MRN: [id number] DOB: 08/05/1946   Cancelled Treatment:    Reason Eval/Treat Not Completed: Medical issues which prohibited therapy (pt notes having just vomited.  )  Pt declining any mobility at this time.  Will f/u another time.     Sunny Schlein,  [phone] 12/30/2012, 12:22 PM

## 2012-12-30 NOTE — Progress Notes (Addendum)
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 She completed a course of oral antibiotics for a pneumonia about 5 weeks ago, but feels that she has not recovered from that since. She has been very weak, with persistent chills and subjective fever, non-productive cough and poor appetite. Found to have persistent infiltrate on CXR.  Assessment/Plan:  PNA RLL This appears to be persistent or recurrent.  Patient presented with cough, intermittently productive of greenish phlegm, generally unwell with poor appetite, and CXR demonstrated patchy densities at the right lung base.  Given the fact that patient is immunocompromised on Remicade, she was started on broad-spectrum IV antibiotics-vancomycin, Zosyn and azithromycin. Clinically improving-decreased cough, no dyspnea, afebrile and resolved leukocytosis. Blood cultures negative to date. Sputum culture shows normal OP flora. Urinary strep antigen negative. Completed azithromycin. IV vancomycin and Zosyn discontinued and transitioned to oral Levaquin-complete total 8 days treatment. Recommend outpatient followup of chest x-ray in a couple of weeks to ensure resolution. Dysphagia was suspected and SLP evaluated and recommend regular consistency diet  UTI (lower urinary tract infection) Levaquin will cover.  Dehydration/Hyponatremia/ARF Patient presented with a creatinine of 2.8, BUN 33, consistent with dehydration/ARF. Base line creatinine was 1.28 on 12/06/12.  This is likely pre-renal, secondary to dehydration due to poor oral intake and diarrhea, as well as diuretic therapy.  These same factors are also responsible for patient's presenting hyponatremia of 125.  Diuretic is on hold, avoiding  nephrotoxins, managing with iv fluids. All resolved. Discontinue IV fluids and monitor.  Hypokalemia/hypomagnesemia Due to diuretics initially, now due to GI fluid loss. Repleted.  Metabolic Acidosis Patient was noted to have a CO2 of 7, AG of 18. This is a high anion gap metabolic acidosis likely multi-factorial, due to dehydration/ARF, pneumonia. Early sepsis was considered, but Procalcitonin was 0.30 and lactic acid was 1.7. Managed with iv bicarbonate infusion earlier.  Bicarbonate infusion has been discontinued. Currently Non AG (AG:11) metabolic acidosis probably from diarrhea. Diarrhea improving-monitor BMP.  Diarrhea history of Crohn disease Pain in abdomen is not typical to the patient's Crohn's flare.  C. difficile is negative. Imodium has not been effective.  We'll check fecal lactoferrin.  Diarrhea may be secondary to antibiotics-improving.? IBS  Crohn's disease Patient has had increased stool frequency to about 4-5 bowel movements per day, (usually 2-3/day), over the last couple of weeks, and stools are watery. C. Difficile PCR is negative. Abdominal/pelvic CT showed no acute findings within the abdomen or pelvis. Have commenced Loperamide w/ improvement in sx. Diarrhea seems to be better today-monitor. No abdominal pain.   Chronic anemia Stable over last 48 hours. Follow CBC.   Code Status: FULL Family Communication: Discussed with patient Disposition Plan: Home when medically ready for discharge.  Consultants: none  Procedures: none  Antibiotics: Vancomycin 12/25/12 >>> 12/28/2012 Zosyn 12/25/12 >>> 12/29/12 Azithromycin 12/26/12 >>> 12/29/12 Oral Levaquin 12/29/12 >  DVT prophylaxis: lovenox  HPI/Subjective: Denies cough or dyspnea. No abdominal pain. Diarrhea seems to be better-last BM at 4 AM. Did not sleep well at night-states she takes Ambien 10 mg each bedtime when necessary.   Objective: Blood pressure 99/56, pulse 86, temperature 98.1 F (36.7 C),  temperature source Oral, resp. rate 18, height 5' 4.5" (1.638 m), weight 61.4 kg (135 lb 5.8 oz), SpO2 100.00%.  Intake/Output Summary (  Last 24 hours) at 12/30/12 1308 Last data filed at 12/30/12 0401  Gross per 24 hour  Intake 501.67 ml  Output      0 ml  Net 501.67 ml     Exam: General: No acute respiratory distress at rest - Lungs: Coarse breath sounds in right lower lobe - no wheeze no rhonchi. Rest of lung fields clear to auscultation. No increased work of breathing. Cardiovascular: Regular rate and rhythm without murmur gallop or rub normal S1 and S2 Abdomen: Nondistended, soft and nontender. Normal bowel sounds heard.  Extremities: Symmetrical 5 x 5 power CNS: Alert and oriented. No focal neurological deficits.  Data Reviewed: Basic Metabolic Panel:  Lab 12/30/12 1610 12/29/12 0440 12/28/12 1445 12/28/12 0449 12/27/12 1346 12/27/12 0440  NA 136 136 -- 135 134* 135  K 4.8 3.3* 2.6* 2.4* 3.0* --  CL 110 106 -- 104 106 108  CO2 15* 18* -- 16* 13* 9*  GLUCOSE 103* 81 -- 100* 118* 100*  BUN 5* 10 -- 13 20 26*  CREATININE 1.04 1.37* -- 1.64* 1.94* 2.18*  CALCIUM 7.8* 7.6* -- 7.8* 8.4 8.8  MG 1.7 1.2* -- 1.3* -- 1.9  PHOS -- -- -- 1.8* -- --   Liver Function Tests:  Lab 12/27/12 0440 12/25/12 1844  AST 20 43*  ALT 49* 89*  ALKPHOS 99 135*  BILITOT 0.4 0.3  PROT 7.6 9.6*  ALBUMIN 2.8* 3.5   CBC:  Lab 12/29/12 0440 12/28/12 0449 12/26/12 1152 12/25/12 1844  WBC 9.1 11.3* 14.3* 16.3*  NEUTROABS -- -- -- 10.4*  HGB 9.1* 9.4* 11.8* 13.5  HCT 25.9* 26.5* 34.5* 38.6  MCV 80.7 79.3 81.4 81.3  PLT 286 347 477* 597*    Recent Results (from the past 240 hour(s))  URINE CULTURE     Status: Normal   Collection Time   12/25/12  9:03 PM      Component Value Range Status Comment   Specimen Description URINE, RANDOM   Final    Special Requests ADDED 2122   Final    Culture  Setup Time 12/25/2012 23:25   Final    Colony Count 50,000 COLONIES/ML   Final    Culture  KLEBSIELLA PNEUMONIAE   Final    Report Status 12/27/2012 FINAL   Final    Organism ID, Bacteria KLEBSIELLA PNEUMONIAE   Final   CULTURE, BLOOD (ROUTINE X 2)     Status: Normal (Preliminary result)   Collection Time   12/25/12 10:13 PM      Component Value Range Status Comment   Specimen Description BLOOD RIGHT ARM   Final    Special Requests BOTTLES DRAWN AEROBIC ONLY 10CC   Final    Culture  Setup Time 12/26/2012 02:04   Final    Culture     Final    Value:        BLOOD CULTURE RECEIVED NO GROWTH TO DATE CULTURE WILL BE HELD FOR 5 DAYS BEFORE ISSUING A FINAL NEGATIVE REPORT   Report Status PENDING   Incomplete   CULTURE, BLOOD (ROUTINE X 2)     Status: Normal (Preliminary result)   Collection Time   12/25/12 10:22 PM      Component Value Range Status Comment   Specimen Description BLOOD RIGHT HAND   Final    Special Requests BOTTLES DRAWN AEROBIC ONLY 1CC   Final    Culture  Setup Time 12/26/2012 02:04   Final    Culture     Final  Value:        BLOOD CULTURE RECEIVED NO GROWTH TO DATE CULTURE WILL BE HELD FOR 5 DAYS BEFORE ISSUING A FINAL NEGATIVE REPORT   Report Status PENDING   Incomplete   CULTURE, EXPECTORATED SPUTUM-ASSESSMENT     Status: Normal   Collection Time   12/25/12 11:52 PM      Component Value Range Status Comment   Specimen Description SPUTUM   Final    Special Requests Immunocompromised   Final    Sputum evaluation     Final    Value: THIS SPECIMEN IS ACCEPTABLE. RESPIRATORY CULTURE REPORT TO FOLLOW.   Report Status 12/26/2012 FINAL   Final   CULTURE, RESPIRATORY     Status: Normal   Collection Time   12/25/12 11:52 PM      Component Value Range Status Comment   Specimen Description SPUTUM   Final    Special Requests ADDED 0104   Final    Gram Stain     Final    Value: FEW WBC PRESENT, PREDOMINANTLY PMN     FEW SQUAMOUS EPITHELIAL CELLS PRESENT     RARE GRAM POSITIVE COCCI IN PAIRS   Culture NORMAL OROPHARYNGEAL FLORA   Final    Report Status 12/28/2012  FINAL   Final   CLOSTRIDIUM DIFFICILE BY PCR     Status: Normal   Collection Time   12/26/12  5:50 PM      Component Value Range Status Comment   C difficile by pcr NEGATIVE  NEGATIVE Final   MRSA PCR SCREENING     Status: Normal   Collection Time   12/26/12  7:52 PM      Component Value Range Status Comment   MRSA by PCR NEGATIVE  NEGATIVE Final      Studies:  Recent x-ray studies have been reviewed in detail by the Attending Physician  Scheduled Meds:  Reviewed in detail by the Attending Physician   Paulett Kaufhold 1:24 PM Pager [phone]  If 7PM-7AM, please contact night-coverage [email or web address] Password [email or web address] 12/30/2012, 1:08 PM   LOS: 5 days

## 2012-12-31 LAB — CBC
Hemoglobin: 8.8 g/dL — ABNORMAL LOW (ref 12.0–15.0)
RBC: 3.12 MIL/uL — ABNORMAL LOW (ref 3.87–5.11)
WBC: 8.5 10*3/uL (ref 4.0–10.5)

## 2012-12-31 LAB — FECAL LACTOFERRIN, QUANT

## 2012-12-31 LAB — BASIC METABOLIC PANEL
CO2: 17 mEq/L — ABNORMAL LOW (ref 19–32)
GFR calc non Af Amer: 54 mL/min — ABNORMAL LOW (ref >90)
Glucose, Bld: 86 mg/dL (ref 70–99)
Potassium: 4.2 mEq/L (ref 3.5–5.1)
Sodium: 139 mEq/L (ref 135–145)

## 2012-12-31 MED ORDER — ALBUTEROL SULFATE (5 MG/ML) 0.5% IN NEBU
2.5000 mg | INHALATION_SOLUTION | Freq: Three times a day (TID) | RESPIRATORY_TRACT | Status: DC
  Administered 2012-12-31: 2.5 mg via RESPIRATORY_TRACT
  Filled 2012-12-31: qty 0.5

## 2012-12-31 MED ORDER — ZOLPIDEM TARTRATE 5 MG PO TABS: 5.0000 mg | ORAL_TABLET | Freq: Every evening | ORAL | Status: DC | PRN

## 2012-12-31 MED ORDER — ALBUTEROL SULFATE (5 MG/ML) 0.5% IN NEBU: 2.5000 mg | INHALATION_SOLUTION | Freq: Four times a day (QID) | RESPIRATORY_TRACT | Status: DC | PRN

## 2012-12-31 NOTE — Progress Notes (Signed)
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 She completed a course of oral antibiotics for a pneumonia about 5 weeks ago, but feels that she has not recovered from that since. She has been very weak, with persistent chills and subjective fever, non-productive cough and poor appetite. Found to have persistent infiltrate on CXR.  Assessment/Plan:  PNA RLL This appears to be persistent or recurrent.  Patient presented with cough, intermittently productive of greenish phlegm, generally unwell with poor appetite, and CXR demonstrated patchy densities at the right lung base.  Given the fact that patient is immunocompromised on Remicade, she was started on broad-spectrum IV antibiotics-vancomycin, Zosyn and azithromycin. Clinically improving-no cough dyspnea, afebrile and resolved leukocytosis. Blood cultures negative to date. Sputum culture shows normal OP flora. Urinary strep antigen negative. Completed azithromycin. IV vancomycin and Zosyn discontinued and transitioned to oral Levaquin-complete total 8 days treatment. Recommend outpatient followup of chest x-ray in a couple of weeks to ensure resolution. Dysphagia was suspected and SLP evaluated and recommend regular consistency diet  Klebsiella Pneumoniae UTI (lower urinary tract infection) Levaquin will cover.  Dehydration/Hyponatremia/ARF Patient presented with a creatinine of 2.8, BUN 33, consistent with dehydration/ARF. Base line creatinine was 1.28 on 12/06/12.  This is likely pre-renal, secondary to dehydration due to poor oral intake and diarrhea, as well as diuretic therapy.  These same factors are also responsible for patient's presenting hyponatremia of 125.  Diuretic is on hold,  avoiding nephrotoxins, managing with iv fluids. All resolved. Discontinued IV fluids.  Hypokalemia/hypomagnesemia Due to diuretics and diarrhea. Repleted.  Metabolic Acidosis Patient was noted to have a CO2 of 7, AG of 18. This is a high anion gap metabolic acidosis likely multi-factorial, due to dehydration/ARF, pneumonia. Early sepsis was considered, but Procalcitonin was 0.30 and lactic acid was 1.7. Managed with iv bicarbonate infusion earlier.  Bicarbonate infusion has been discontinued. Currently Non AG (AG:10) metabolic acidosis probably from diarrhea. Diarrhea improving-monitor BMP- Bicarb better at 17.  Diarrhea history of Crohn disease Pain in abdomen is not typical to the patient's Crohn's flare.  C. difficile is negative. Fecal lactoferrin positive (? Significance).  Diarrhea may be secondary to antibiotics-improving.? IBS. Only 2 BM's all day yesterday. Last BM was 1/24 afternoon.  Crohn's disease Patient has had increased stool frequency to about 4-5 bowel movements per day, (usually 2-3/day), over the last couple of weeks, and stools are watery. C. Difficile PCR is negative. Abdominal/pelvic CT showed no acute findings within the abdomen or pelvis. Have commenced Loperamide w improvement in sx. Diarrhea seems to be improving. No abdominal pain.   Chronic anemia Stable. Follow CBC.   Code Status: FULL Family Communication: Discussed with patient Disposition Plan: Possible d/c home on 1/25. Lives with sister..  Consultants: none  Procedures: none  Antibiotics: Vancomycin 12/25/12 >>> 12/28/2012 Zosyn 12/25/12 >>> 12/29/12 Azithromycin 12/26/12 >>> 12/29/12 Oral Levaquin 12/29/12 >  DVT prophylaxis: lovenox  HPI/Subjective: Denies cough or dyspnea. No abdominal pain. Diarrhea seems to be better-Only 2 BM's all day yesterday. Last BM was 1/24 afternoon.   Objective: Blood pressure 110/60, pulse 80, temperature 98.3 F (36.8 C), temperature source Oral, resp. rate  16, height 5' 4.5" (1.638 m), weight 61.4 kg (135 lb 5.8 oz), SpO2 98.00%.  Intake/Output Summary (Last 24 hours) at 12/31/12 1100 Last data  filed at 12/30/12 1830  Gross per 24 hour  Intake    578 ml  Output      0 ml  Net    578 ml     Exam: General: No acute respiratory distress at rest - Lungs: Occasional crackles right base. Rest of lung fields clear to auscultation. No increased work of breathing. Cardiovascular: Regular rate and rhythm without murmur gallop or rub normal S1 and S2 Abdomen: Nondistended, soft and nontender. Normal bowel sounds heard.  Extremities: Symmetrical 5 x 5 power CNS: Alert and oriented. No focal neurological deficits.  Data Reviewed: Basic Metabolic Panel:  Lab 12/31/12 1610 12/30/12 0820 12/29/12 0440 12/28/12 1445 12/28/12 0449 12/27/12 1346 12/27/12 0440  NA 139 136 136 -- 135 134* --  K 4.2 4.8 3.3* 2.6* 2.4* -- --  CL 112 110 106 -- 104 106 --  CO2 17* 15* 18* -- 16* 13* --  GLUCOSE 86 103* 81 -- 100* 118* --  BUN 3* 5* 10 -- 13 20 --  CREATININE 1.06 1.04 1.37* -- 1.64* 1.94* --  CALCIUM 8.2* 7.8* 7.6* -- 7.8* 8.4 --  MG -- 1.7 1.2* -- 1.3* -- 1.9  PHOS -- -- -- -- 1.8* -- --   Liver Function Tests:  Lab 12/27/12 0440 12/25/12 1844  AST 20 43*  ALT 49* 89*  ALKPHOS 99 135*  BILITOT 0.4 0.3  PROT 7.6 9.6*  ALBUMIN 2.8* 3.5   CBC:  Lab 12/31/12 0500 12/29/12 0440 12/28/12 0449 12/26/12 1152 12/25/12 1844  WBC 8.5 9.1 11.3* 14.3* 16.3*  NEUTROABS -- -- -- -- 10.4*  HGB 8.8* 9.1* 9.4* 11.8* 13.5  HCT 25.9* 25.9* 26.5* 34.5* 38.6  MCV 83.0 80.7 79.3 81.4 81.3  PLT 266 286 347 477* 597*    Recent Results (from the past 240 hour(s))  URINE CULTURE     Status: Normal   Collection Time   12/25/12  9:03 PM      Component Value Range Status Comment   Specimen Description URINE, RANDOM   Final    Special Requests ADDED 2122   Final    Culture  Setup Time 12/25/2012 23:25   Final    Colony Count 50,000 COLONIES/ML   Final     Culture KLEBSIELLA PNEUMONIAE   Final    Report Status 12/27/2012 FINAL   Final    Organism ID, Bacteria KLEBSIELLA PNEUMONIAE   Final   CULTURE, BLOOD (ROUTINE X 2)     Status: Normal (Preliminary result)   Collection Time   12/25/12 10:13 PM      Component Value Range Status Comment   Specimen Description BLOOD RIGHT ARM   Final    Special Requests BOTTLES DRAWN AEROBIC ONLY 10CC   Final    Culture  Setup Time 12/26/2012 02:04   Final    Culture     Final    Value:        BLOOD CULTURE RECEIVED NO GROWTH TO DATE CULTURE WILL BE HELD FOR 5 DAYS BEFORE ISSUING A FINAL NEGATIVE REPORT   Report Status PENDING   Incomplete   CULTURE, BLOOD (ROUTINE X 2)     Status: Normal (Preliminary result)   Collection Time   12/25/12 10:22 PM      Component Value Range Status Comment   Specimen Description BLOOD RIGHT HAND   Final    Special Requests BOTTLES DRAWN AEROBIC ONLY 1CC   Final    Culture  Setup Time 12/26/2012 02:04  Final    Culture     Final    Value:        BLOOD CULTURE RECEIVED NO GROWTH TO DATE CULTURE WILL BE HELD FOR 5 DAYS BEFORE ISSUING A FINAL NEGATIVE REPORT   Report Status PENDING   Incomplete   CULTURE, EXPECTORATED SPUTUM-ASSESSMENT     Status: Normal   Collection Time   12/25/12 11:52 PM      Component Value Range Status Comment   Specimen Description SPUTUM   Final    Special Requests Immunocompromised   Final    Sputum evaluation     Final    Value: THIS SPECIMEN IS ACCEPTABLE. RESPIRATORY CULTURE REPORT TO FOLLOW.   Report Status 12/26/2012 FINAL   Final   CULTURE, RESPIRATORY     Status: Normal   Collection Time   12/25/12 11:52 PM      Component Value Range Status Comment   Specimen Description SPUTUM   Final    Special Requests ADDED 0104   Final    Gram Stain     Final    Value: FEW WBC PRESENT, PREDOMINANTLY PMN     FEW SQUAMOUS EPITHELIAL CELLS PRESENT     RARE GRAM POSITIVE COCCI IN PAIRS   Culture NORMAL OROPHARYNGEAL FLORA   Final    Report Status  12/28/2012 FINAL   Final   CLOSTRIDIUM DIFFICILE BY PCR     Status: Normal   Collection Time   12/26/12  5:50 PM      Component Value Range Status Comment   C difficile by pcr NEGATIVE  NEGATIVE Final   MRSA PCR SCREENING     Status: Normal   Collection Time   12/26/12  7:52 PM      Component Value Range Status Comment   MRSA by PCR NEGATIVE  NEGATIVE Final      Studies:  Recent x-ray studies have been reviewed in detail by the Attending Physician  Scheduled Meds:  Reviewed in detail by the Attending Physician   Tameshia Bonneville 11:00 AM Pager [phone]  If 7PM-7AM, please contact night-coverage [email or web address] Password [email or web address] 12/31/2012, 11:00 AM   LOS: 6 days

## 2012-12-31 NOTE — Evaluation (Signed)
Physical Therapy Evaluation Patient Details Name: Danielle [name] MRN: [id number] DOB: 07-13-1946 Today's Date: 12/31/2012 Time: 7829-5621 PT Time Calculation (min): 22 min  PT Assessment / Plan / Recommendation Clinical Impression  Pt with recent extended illness resulting in decreased activitiy tolerance and generalized weakness.  PT indicated to increase strength and mobility to assist with safe d/c home.    PT Assessment  Patient needs continued PT services    Follow Up Recommendations  No PT follow up    Does the patient have the potential to tolerate intense rehabilitation      Barriers to Discharge None      Equipment Recommendations  None recommended by PT    Recommendations for Other Services     Frequency Min 3X/week    Precautions / Restrictions Precautions Precautions: None Restrictions Weight Bearing Restrictions: No   Pertinent Vitals/Pain 0/10      Mobility  Bed Mobility Bed Mobility: Supine to Sit Supine to Sit: 6: Modified independent (Device/Increase time);With rails Transfers Transfers: Sit to Stand;Stand to Sit Sit to Stand: 4: Min guard;From bed;With upper extremity assist Stand to Sit: 4: Min guard;With armrests;To chair/3-in-1 Details for Transfer Assistance: verbal cues for sequencing Ambulation/Gait Ambulation/Gait Assistance: 4: Min guard Ambulation Distance (Feet): 150 Feet Assistive device: Other (Comment) (pushing IV pole) Gait Pattern: Within Functional Limits Gait velocity: decreased    Shoulder Instructions     Exercises     PT Diagnosis: Generalized weakness  PT Problem List: Decreased strength;Decreased activity tolerance;Decreased mobility PT Treatment Interventions: Gait training;Functional mobility training;Therapeutic activities;Therapeutic exercise;Patient/family education   PT Goals Acute Rehab PT Goals PT Goal Formulation: With patient Time For Goal Achievement: 01/07/13 Potential to Achieve Goals: Good Pt  will go Sit to Stand: with modified independence PT Goal: Sit to Stand - Progress: Goal set today Pt will go Stand to Sit: with modified independence PT Goal: Stand to Sit - Progress: Goal set today Pt will Transfer Bed to Chair/Chair to Bed: with modified independence PT Transfer Goal: Bed to Chair/Chair to Bed - Progress: Goal set today Pt will Ambulate: >150 feet;with modified independence PT Goal: Ambulate - Progress: Goal set today  Visit Information  Last PT Received On: 12/31/12 Assistance Needed: +1    Subjective Data  Subjective: "I am feeling better." Patient Stated Goal: home   Prior Functioning  Home Living Lives With: Family Available Help at Discharge: Family Type of Home: House Home Access: Level entry Home Layout: One level Bathroom Shower/Tub: Engineer, manufacturing systems: Standard Home Adaptive Equipment: None Prior Function Level of Independence: Independent Able to Take Stairs?: Yes Driving: Yes Vocation: Full time employment Communication Communication: No difficulties    Cognition  Overall Cognitive Status: Appears within functional limits for tasks assessed/performed Arousal/Alertness: Awake/alert Orientation Level: Appears intact for tasks assessed Behavior During Session: [facility] for tasks performed    Extremity/Trunk Assessment     Balance    End of Session PT - End of Session Equipment Utilized During Treatment: Gait belt Activity Tolerance: Patient tolerated treatment well Patient left: in chair;with call bell/phone within reach Nurse Communication: Mobility status  GP     Ilda Foil 12/31/2012, 1:19 PM  Aida Raider, PT  Office # [phone] Pager [phone]

## 2012-12-31 NOTE — Progress Notes (Signed)
Pt ambulated down hall and back, tolerated well.  Pt refusing to ambulate to bathroom.  Will educate pt.  Will continue to observe for status changes.

## 2013-01-01 LAB — BASIC METABOLIC PANEL
BUN: <3 mg/dL — ABNORMAL LOW (ref 6–23)
Chloride: 110 mEq/L (ref 96–112)
GFR calc Af Amer: 59 mL/min — ABNORMAL LOW (ref >90)
Potassium: 3.6 mEq/L (ref 3.5–5.1)

## 2013-01-01 LAB — CULTURE, BLOOD (ROUTINE X 2): Culture: NO GROWTH

## 2013-01-01 LAB — CBC
HCT: 25.5 % — ABNORMAL LOW (ref 36.0–46.0)
Hemoglobin: 8.7 g/dL — ABNORMAL LOW (ref 12.0–15.0)
MCHC: 34.1 g/dL (ref 30.0–36.0)

## 2013-01-01 MED ORDER — DM-GUAIFENESIN ER 30-600 MG PO TB12: 1.0000 | ORAL_TABLET | Freq: Two times a day (BID) | ORAL | Status: DC

## 2013-01-01 MED ORDER — LOPERAMIDE HCL 2 MG PO CAPS: 2.0000 mg | ORAL_CAPSULE | Freq: Three times a day (TID) | ORAL | Status: DC | PRN

## 2013-01-01 MED ORDER — LEVOFLOXACIN 750 MG PO TABS: 750.0000 mg | ORAL_TABLET | ORAL | Status: DC

## 2013-01-01 NOTE — Discharge Summary (Signed)
Physician Discharge Summary  Danielle [name]:[id number] DOB: 1946/05/16 DOA: 12/25/2012  PCP: [name], MD  Admit date: 12/25/2012 Discharge date: 01/01/2013  Time spent: greater than 30 minutes  Recommendations for Outpatient Follow-up:  1. With Dr. Mirna Mires, PCP in 5 days from hospital discharge with repeat labs (BMP & CBC) 2. With Dr. Lionel December, GI 3. Recommend follow up chest X Ray in a couple of weeks to ensure resolution of Pneumonia findings.  Discharge Diagnoses:  Principal Problem:  *PNA (pneumonia) Active Problems:  Hypokalemia  Dysphagia  Leukocytosis  Crohn's disease  Hyponatremia  UTI (lower urinary tract infection)  Weakness generalized  Acute renal failure  Dehydration  Cough  SOB (shortness of breath)   Discharge Condition: Improved & stable.  Diet recommendation: Heart Healthy.  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 She completed a course of oral antibiotics for a pneumonia about 5 weeks ago, but feels that she has not recovered from that since. She has been very weak, with persistent chills and subjective fever, non-productive cough and poor appetite. Found to have persistent infiltrate on CXR.   Hospital Course:   PNA RLL  This appears to be persistent or recurrent.  Patient presented with cough, intermittently productive of greenish phlegm, generally unwell with poor appetite, and CXR demonstrated patchy densities at the right lung base.  Given the fact that patient is immunocompromised on Remicade, she was started on broad-spectrum IV antibiotics-vancomycin, Zosyn and azithromycin. Clinically improved-no cough dyspnea, afebrile and resolved leukocytosis. Blood cultures negative to date. Sputum culture showed normal OP  flora. Urinary strep antigen negative. Completed azithromycin. IV vancomycin and Zosyn discontinued and transitioned to oral Levaquin-complete total 8 days treatment. Recommend outpatient followup of chest x-ray in a couple of weeks to ensure resolution.  Dysphagia was suspected and SLP evaluated and recommend regular consistency diet   Klebsiella Pneumoniae UTI (lower urinary tract infection)  Levaquin will cover.   Dehydration/Hyponatremia/ARF  Patient presented with a creatinine of 2.8, BUN 33, consistent with dehydration/ARF. Base line creatinine was 1.28 on 12/06/12.  This was likely pre-renal, secondary to dehydration due to poor oral intake and diarrhea, as well as diuretic therapy.  These same factors are also responsible for patient's presenting hyponatremia of 125.  Diuretic were held, avoiding nephrotoxins, managed with iv fluids.  All resolved. Discontinued IV fluids.   Hypokalemia/hypomagnesemia  Due to diuretics and diarrhea.  Repleted.   Metabolic Acidosis  Patient was noted to have a CO2 of 7, AG of 18. This is a high anion gap metabolic acidosis likely multi-factorial, due to dehydration/ARF, pneumonia. Early sepsis was considered, but Procalcitonin was 0.30 and lactic acid was 1.7. Managed with iv bicarbonate infusion earlier. Review of prior labs indicate chronic intermittent low bicarbonate/? Related to diarrhea episode flare ups.   Diarrhea history of Crohn disease  Pain in abdomen is not typical to the patient's Crohn's flare.  C. difficile is negative. Fecal lactoferrin positive (? Significance).  Diarrhea may be secondary to antibiotics-improving.? IBS. Diarrhea significantly improved. Continue probiotics.  Crohn's disease  Patient has had increased stool frequency to about 4-5 bowel movements per day, (usually 2-3/day), over the last couple of weeks, and stools are watery. C. Difficile PCR is negative. Abdominal/pelvic CT showed no acute findings within the abdomen  or pelvis. Have commenced Loperamide w improvement in sx. Diarrhea improved. Will change Loperamide to as needed, on discharge.   Chronic anemia  Stable. Follow CBC.  Large Hiatal Hernia, by imaging. Continue PPI's   Procedures:  None  Consultations:  None  Discharge Exam:  Complaints: Denies cough or dyspnea. Soft BM's- frequency almost back to baseline.  Filed Vitals:   12/31/12 0807 12/31/12 1500 12/31/12 2239 01/01/13 0634  BP:  98/58 99/60 93/53   Pulse:  80 74 79  Temp:  98.8 F (37.1 C) 98.2 F (36.8 C) 98.4 F (36.9 C)  TempSrc:  Oral  Oral  Resp:  16 18 16   Height:      Weight:      SpO2: 98% 100% 100% 100%    General: No acute respiratory distress at rest -  Lungs: Occasional crackles right base. Rest of lung fields clear to auscultation. No increased work of breathing.  Cardiovascular: Regular rate and rhythm without murmur gallop or rub normal S1 and S2  Abdomen: Nondistended, soft and nontender. Normal bowel sounds heard.  Extremities: Symmetrical 5 x 5 power  CNS: Alert and oriented. No focal neurological deficits.  Discharge Instructions      Discharge Orders    Future Appointments: Provider: Department: Dept Phone: Center:   01/23/2013 10:00 AM Malissa Hippo, MD [facility] [phone] None     Future Orders Please Complete By Expires   Diet - low sodium heart healthy      Increase activity slowly      Call MD for:  temperature >100.4      Call MD for:  difficulty breathing, headache or visual disturbances      Call MD for:      Comments:   Worsening diarrhea.   Call MD for:  persistant dizziness or light-headedness      Call MD for:  extreme fatigue          Medication List     As of 01/01/2013  1:01 PM    STOP taking these medications         spironolactone 50 MG tablet   Commonly known as: ALDACTONE      TAKE these medications         bifidobacterium infantis capsule   Take 1 capsule by mouth daily.       Calcium-Vitamin D 600-200 MG-UNIT per tablet   Take 1 tablet by mouth daily. Do not resume this medication until it has been cleared with your Gastroenterologist.      dextromethorphan-guaiFENesin 30-600 MG per 12 hr tablet   Commonly known as: MUCINEX DM   Take 1 tablet by mouth 2 (two) times daily.      dicyclomine 10 MG/5ML syrup   Commonly known as: BENTYL   Take 15 mg by mouth 3 (three) times daily as needed. For IBS      HYDROcodone-acetaminophen 5-325 MG per tablet   Commonly known as: NORCO/VICODIN   Take 1 tablet by mouth every 6 (six) hours as needed. For pain      inFLIXimab 100 MG injection   Commonly known as: REMICADE   Inject 100 mg into the vein every 6 (six) weeks. Discuss with your gastroenterologist before resuming remicade.      levofloxacin 750 MG tablet   Commonly known as: LEVAQUIN   Take 1 tablet (750 mg total) by mouth every other day. Start 01/02/2013.      loperamide 2 MG capsule   Commonly  known as: IMODIUM   Take 1 capsule (2 mg total) by mouth 3 (three) times daily as needed for diarrhea or loose stools.      multivitamin per tablet   Take 1 tablet by mouth daily.      pantoprazole 40 MG tablet   Commonly known as: PROTONIX   Take 1 tablet (40 mg total) by mouth daily.      promethazine 25 MG tablet   Commonly known as: PHENERGAN   Take 1 tablet (25 mg total) by mouth every 6 (six) hours as needed for nausea.      VITAMIN B-12 IJ   Inject as directed every 30 (thirty) days.      zolpidem 10 MG tablet   Commonly known as: AMBIEN   Take 1 tablet (10 mg total) by mouth as needed. insomnia        Follow-up Information    Follow up with [facility], MD. Schedule an appointment as soon as possible for a visit in 5 days. (To be seen with repeat labs (BMP))    Contact information:   [address] [place] [address] [phone]       Schedule an appointment as soon as possible for a visit with Malissa Hippo, MD.    Contact information:   [address], [address] [place] [address] [phone]           The results of significant diagnostics from this hospitalization (including imaging, microbiology, ancillary and laboratory) are listed below for reference.    Significant Diagnostic Studies: Ct Abdomen Pelvis Wo Contrast  12/26/2012  *RADIOLOGY REPORT*  Clinical Data: Left-sided abdominal pain.  Sepsis.  Acidosis. Crohn's disease.  CT ABDOMEN AND PELVIS WITHOUT CONTRAST  Technique:  Multidetector CT imaging of the abdomen and pelvis was performed following the standard protocol without intravenous contrast.  Comparison: 03/28/2012  Findings: A large hiatal hernia is again seen.  Postop changes are again seen from previous bowel resections.  There is no evidence of bowel obstruction.  Surgical clips are also seen from prior cholecystectomy.  The liver, spleen, pancreas, adrenal glands, and kidneys have a normal appearance on this noncontrast study.  No evidence of hydronephrosis.  No soft tissue masses or lymphadenopathy identified within the abdomen or pelvis.  The no evidence of inflammatory process or abnormal fluid collections.  No evidence of bowel wall thickening, dilatation, or hernia.  Images obtained to the lung bases show new infiltrate in the anterior right lower lobe containing air bronchograms.  This is suspicious for pneumonia.  No pleural effusion identified.  IMPRESSION:  1.  No acute findings within the abdomen or pelvis.  No evidence of bowel obstruction. 2.  Stable appearance of large hiatal hernia. 3.  New infiltrate in the anterior right lower lobe, suspicious for pneumonia.   Original Report Authenticated By: Myles Rosenthal, M.D.    Dg Chest 2 View  12/07/2012  *RADIOLOGY REPORT*  Clinical Data: Nausea and vomiting; right lateral chest pain.  CHEST - 2 VIEW  Comparison: Chest radiograph performed 11/06/2011  Findings: The lungs are well-aerated.  Bibasilar airspace opacification is noted,  worse on the right.  This raises concern for pneumonia; aspiration cannot be entirely excluded.  No pleural effusion or pneumothorax is seen.  The heart is borderline normal in size.  A somewhat complex large hiatal hernia is again noted, filled with air.  No acute osseous abnormalities are seen.  IMPRESSION:  1.  Bibasilar airspace opacification, worse  on the right.  This raises concern for pneumonia; aspiration cannot be entirely excluded. 2.  Somewhat complex large hiatal hernia again noted, filled with air.   Original Report Authenticated By: Tonia Ghent, M.D.    Dg Chest Port 1 View  12/29/2012  *RADIOLOGY REPORT*  Clinical Data: Evaluate pulmonary infiltrates  PORTABLE CHEST - 1 VIEW  Comparison: 12/25/2012; 12/06/2012  Findings: Kyphotic projection.  Grossly unchanged cardiac silhouette and mediastinal contours with retrocardiac opacity compatible with known large hiatal hernia.  Interval placement of right upper extremity approach PICC line with tip projected over the superior cavoatrial junction.  Grossly unchanged right lower lung heterogeneous air space opacities and likely trace right-sided effusion.  Unchanged left basilar/retrocardiac opacity favored to represent atelectasis.  No new focal airspace opacities.  No definite pneumothorax.  Unchanged bones.  IMPRESSION: 1.  Grossly unchanged right basilar heterogeneous opacities worrisome for infection. A follow-up chest radiograph in 4 to 6 weeks after treatment is recommended to ensure resolution. 2.  Right upper extremity approach PICC line tip projects over the superior cavoatrial junction.   Original Report Authenticated By: Tacey Ruiz, MD    Dg Abd Acute W/chest  12/25/2012  *RADIOLOGY REPORT*  Clinical Data: Weakness and history of pneumonia.  History of Crohn's disease.  ACUTE ABDOMEN SERIES (ABDOMEN 2 VIEW & CHEST 1 VIEW)  Comparison: 12/06/2012  Findings: The chest radiograph demonstrates patchy densities at the right lung base.  Again  noted is a massive hiatal hernia.  The left lung is clear.  No evidence for free air.  Surgical clips throughout the abdomen.  There are air-fluid levels in the left upper quadrant with dilated bowel loops.  No significant gas in the rectum.  IMPRESSION: Recurrent or residual airspace disease at the right lung base. Findings are concerning for pneumonia.  Recommend close follow-up of this area to ensure resolution.  If this disease does not resolve, recommend further characterization with CT.  Dilated bowel loops in the left upper abdomen.  Partial bowel obstruction cannot be excluded based on these findings.   Original Report Authenticated By: Richarda Overlie, M.D.     Microbiology: Recent Results (from the past 240 hour(s))  URINE CULTURE     Status: Normal   Collection Time   12/25/12  9:03 PM      Component Value Range Status Comment   Specimen Description URINE, RANDOM   Final    Special Requests ADDED 2122   Final    Culture  Setup Time 12/25/2012 23:25   Final    Colony Count 50,000 COLONIES/ML   Final    Culture KLEBSIELLA PNEUMONIAE   Final    Report Status 12/27/2012 FINAL   Final    Organism ID, Bacteria KLEBSIELLA PNEUMONIAE   Final   CULTURE, BLOOD (ROUTINE X 2)     Status: Normal (Preliminary result)   Collection Time   12/25/12 10:13 PM      Component Value Range Status Comment   Specimen Description BLOOD RIGHT ARM   Final    Special Requests BOTTLES DRAWN AEROBIC ONLY 10CC   Final    Culture  Setup Time 12/26/2012 02:04   Final    Culture     Final    Value:        BLOOD CULTURE RECEIVED NO GROWTH TO DATE CULTURE WILL BE HELD FOR 5 DAYS BEFORE ISSUING A FINAL NEGATIVE REPORT   Report Status PENDING   Incomplete   CULTURE, BLOOD (ROUTINE X 2)  Status: Normal (Preliminary result)   Collection Time   12/25/12 10:22 PM      Component Value Range Status Comment   Specimen Description BLOOD RIGHT HAND   Final    Special Requests BOTTLES DRAWN AEROBIC ONLY 1CC   Final    Culture   Setup Time 12/26/2012 02:04   Final    Culture     Final    Value:        BLOOD CULTURE RECEIVED NO GROWTH TO DATE CULTURE WILL BE HELD FOR 5 DAYS BEFORE ISSUING A FINAL NEGATIVE REPORT   Report Status PENDING   Incomplete   CULTURE, EXPECTORATED SPUTUM-ASSESSMENT     Status: Normal   Collection Time   12/25/12 11:52 PM      Component Value Range Status Comment   Specimen Description SPUTUM   Final    Special Requests Immunocompromised   Final    Sputum evaluation     Final    Value: THIS SPECIMEN IS ACCEPTABLE. RESPIRATORY CULTURE REPORT TO FOLLOW.   Report Status 12/26/2012 FINAL   Final   CULTURE, RESPIRATORY     Status: Normal   Collection Time   12/25/12 11:52 PM      Component Value Range Status Comment   Specimen Description SPUTUM   Final    Special Requests ADDED 0104   Final    Gram Stain     Final    Value: FEW WBC PRESENT, PREDOMINANTLY PMN     FEW SQUAMOUS EPITHELIAL CELLS PRESENT     RARE GRAM POSITIVE COCCI IN PAIRS   Culture NORMAL OROPHARYNGEAL FLORA   Final    Report Status 12/28/2012 FINAL   Final   CLOSTRIDIUM DIFFICILE BY PCR     Status: Normal   Collection Time   12/26/12  5:50 PM      Component Value Range Status Comment   C difficile by pcr NEGATIVE  NEGATIVE Final   MRSA PCR SCREENING     Status: Normal   Collection Time   12/26/12  7:52 PM      Component Value Range Status Comment   MRSA by PCR NEGATIVE  NEGATIVE Final      Labs: Basic Metabolic Panel:  Lab 01/01/13 1610 12/31/12 0500 12/30/12 0820 12/29/12 0440 12/28/12 1445 12/28/12 0449 12/27/12 0440  NA 135 139 136 136 -- 135 --  K 3.6 4.2 4.8 3.3* 2.6* -- --  CL 110 112 110 106 -- 104 --  CO2 16* 17* 15* 18* -- 16* --  GLUCOSE 73 86 103* 81 -- 100* --  BUN <3* 3* 5* 10 -- 13 --  CREATININE 1.11* 1.06 1.04 1.37* -- 1.64* --  CALCIUM 8.2* 8.2* 7.8* 7.6* -- 7.8* --  MG -- -- 1.7 1.2* -- 1.3* 1.9  PHOS -- -- -- -- -- 1.8* --   Liver Function Tests:  Lab 12/27/12 0440 12/25/12 1844  AST 20  43*  ALT 49* 89*  ALKPHOS 99 135*  BILITOT 0.4 0.3  PROT 7.6 9.6*  ALBUMIN 2.8* 3.5   No results found for this basename: LIPASE:5,AMYLASE:5 in the last 168 hours No results found for this basename: AMMONIA:5 in the last 168 hours CBC:  Lab 01/01/13 0453 12/31/12 0500 12/29/12 0440 12/28/12 0449 12/26/12 1152 12/25/12 1844  WBC 9.4 8.5 9.1 11.3* 14.3* --  NEUTROABS -- -- -- -- -- 10.4*  HGB 8.7* 8.8* 9.1* 9.4* 11.8* --  HCT 25.5* 25.9* 25.9* 26.5* 34.5* --  MCV 83.6 83.0  80.7 79.3 81.4 --  PLT 232 266 286 347 477* --   Cardiac Enzymes: No results found for this basename: CKTOTAL:5,CKMB:5,CKMBINDEX:5,TROPONINI:5 in the last 168 hours BNP: BNP (last 3 results) No results found for this basename: PROBNP:3 in the last 8760 hours CBG: No results found for this basename: GLUCAP:5 in the last 168 hours  HIV Non reactive Urine Legionella & Strep antigen: Negative    Signed:  HONGALGI,ANAND  [facility] 01/01/2013, 1:01 PM

## 2013-01-12 DIAGNOSIS — J189 Pneumonia, unspecified organism: Secondary | ICD-10-CM | POA: Diagnosis not present

## 2013-01-14 ENCOUNTER — Emergency Department (HOSPITAL_COMMUNITY): Payer: Medicare Other

## 2013-01-14 ENCOUNTER — Encounter (HOSPITAL_COMMUNITY): Payer: Self-pay | Admitting: Emergency Medicine

## 2013-01-14 ENCOUNTER — Emergency Department (HOSPITAL_COMMUNITY)
Admission: EM | Admit: 2013-01-14 | Discharge: 2013-01-14 | Disposition: A | Discharge status: Home / Self Care | Payer: Medicare Other | Attending: Emergency Medicine | Admitting: Emergency Medicine

## 2013-01-14 ENCOUNTER — Other Ambulatory Visit: Payer: Self-pay

## 2013-01-14 DIAGNOSIS — Z8709 Personal history of other diseases of the respiratory system: Secondary | ICD-10-CM | POA: Insufficient documentation

## 2013-01-14 DIAGNOSIS — B958 Unspecified staphylococcus as the cause of diseases classified elsewhere: Secondary | ICD-10-CM | POA: Insufficient documentation

## 2013-01-14 DIAGNOSIS — E876 Hypokalemia: Secondary | ICD-10-CM | POA: Insufficient documentation

## 2013-01-14 DIAGNOSIS — K219 Gastro-esophageal reflux disease without esophagitis: Secondary | ICD-10-CM | POA: Insufficient documentation

## 2013-01-14 DIAGNOSIS — Z8719 Personal history of other diseases of the digestive system: Secondary | ICD-10-CM | POA: Insufficient documentation

## 2013-01-14 DIAGNOSIS — Z8739 Personal history of other diseases of the musculoskeletal system and connective tissue: Secondary | ICD-10-CM | POA: Insufficient documentation

## 2013-01-14 DIAGNOSIS — M2550 Pain in unspecified joint: Secondary | ICD-10-CM | POA: Diagnosis not present

## 2013-01-14 DIAGNOSIS — R059 Cough, unspecified: Secondary | ICD-10-CM | POA: Insufficient documentation

## 2013-01-14 DIAGNOSIS — R0602 Shortness of breath: Secondary | ICD-10-CM | POA: Diagnosis not present

## 2013-01-14 DIAGNOSIS — N390 Urinary tract infection, site not specified: Secondary | ICD-10-CM | POA: Diagnosis not present

## 2013-01-14 DIAGNOSIS — R5381 Other malaise: Secondary | ICD-10-CM | POA: Diagnosis not present

## 2013-01-14 DIAGNOSIS — Z87891 Personal history of nicotine dependence: Secondary | ICD-10-CM | POA: Insufficient documentation

## 2013-01-14 DIAGNOSIS — R5383 Other fatigue: Secondary | ICD-10-CM | POA: Insufficient documentation

## 2013-01-14 DIAGNOSIS — Z79899 Other long term (current) drug therapy: Secondary | ICD-10-CM | POA: Diagnosis not present

## 2013-01-14 DIAGNOSIS — R110 Nausea: Secondary | ICD-10-CM | POA: Insufficient documentation

## 2013-01-14 DIAGNOSIS — Z862 Personal history of diseases of the blood and blood-forming organs and certain disorders involving the immune mechanism: Secondary | ICD-10-CM | POA: Diagnosis not present

## 2013-01-14 DIAGNOSIS — E860 Dehydration: Secondary | ICD-10-CM

## 2013-01-14 DIAGNOSIS — Z8679 Personal history of other diseases of the circulatory system: Secondary | ICD-10-CM | POA: Insufficient documentation

## 2013-01-14 DIAGNOSIS — R05 Cough: Secondary | ICD-10-CM | POA: Insufficient documentation

## 2013-01-14 LAB — COMPREHENSIVE METABOLIC PANEL
ALT: 17 U/L (ref 0–35)
Albumin: 3.3 g/dL — ABNORMAL LOW (ref 3.5–5.2)
Alkaline Phosphatase: 111 U/L (ref 39–117)
BUN: 6 mg/dL (ref 6–23)
Calcium: 8.7 mg/dL (ref 8.4–10.5)
Potassium: 2.8 mEq/L — ABNORMAL LOW (ref 3.5–5.1)
Sodium: 134 mEq/L — ABNORMAL LOW (ref 135–145)
Total Protein: 8.6 g/dL — ABNORMAL HIGH (ref 6.0–8.3)

## 2013-01-14 LAB — URINALYSIS, ROUTINE W REFLEX MICROSCOPIC
Hgb urine dipstick: NEGATIVE
Specific Gravity, Urine: 1.015 (ref 1.005–1.030)
Urobilinogen, UA: 0.2 mg/dL (ref 0.0–1.0)

## 2013-01-14 LAB — CBC WITH DIFFERENTIAL/PLATELET
Basophils Relative: 0 % (ref 0–1)
Eosinophils Absolute: 0.2 10*3/uL (ref 0.0–0.7)
MCH: 28.1 pg (ref 26.0–34.0)
MCHC: 34.3 g/dL (ref 30.0–36.0)
Neutrophils Relative %: 61 % (ref 43–77)
Platelets: 433 10*3/uL — ABNORMAL HIGH (ref 150–400)

## 2013-01-14 LAB — CG4 I-STAT (LACTIC ACID): Lactic Acid, Venous: 1.09 mmol/L (ref 0.5–2.2)

## 2013-01-14 LAB — POCT I-STAT TROPONIN I: Troponin i, poc: 0.01 ng/mL (ref 0.00–0.08)

## 2013-01-14 LAB — URINE MICROSCOPIC-ADD ON

## 2013-01-14 MED ORDER — CIPROFLOXACIN HCL 500 MG PO TABS: 250.0000 mg | ORAL_TABLET | Freq: Two times a day (BID) | ORAL | Status: DC

## 2013-01-14 MED ORDER — PROMETHAZINE HCL 25 MG/ML IJ SOLN
12.5000 mg | Freq: Once | INTRAMUSCULAR | Status: AC
  Administered 2013-01-14: 12.5 mg via INTRAVENOUS
  Filled 2013-01-14: qty 1

## 2013-01-14 MED ORDER — POTASSIUM CHLORIDE CRYS ER 20 MEQ PO TBCR: 20.0000 meq | EXTENDED_RELEASE_TABLET | Freq: Every day | ORAL | Status: DC

## 2013-01-14 MED ORDER — PROMETHAZINE HCL 25 MG PO TABS: 25.0000 mg | ORAL_TABLET | Freq: Four times a day (QID) | ORAL | Status: DC | PRN

## 2013-01-14 MED ORDER — SODIUM CHLORIDE 0.9 % IV BOLUS (SEPSIS)
1000.0000 mL | Freq: Once | INTRAVENOUS | Status: AC
  Administered 2013-01-14: 1000 mL via INTRAVENOUS

## 2013-01-14 MED ORDER — POTASSIUM CHLORIDE CRYS ER 20 MEQ PO TBCR
40.0000 meq | EXTENDED_RELEASE_TABLET | Freq: Once | ORAL | Status: AC
  Administered 2013-01-14: 40 meq via ORAL
  Filled 2013-01-14: qty 2

## 2013-01-14 NOTE — ED Notes (Signed)
Pt. C/o pain 7/10 in upper abdominal region, and sts nausea is unrelieved. Pt requesting diluaidid and phenergan. MD to be made aware.

## 2013-01-14 NOTE — ED Notes (Signed)
Pt c/o feekling weak, fatigue and having nausea onset Thursday. Pt reports decrease appetite.

## 2013-01-14 NOTE — ED Notes (Signed)
Pt made aware of need for UA. Pt sts unable to void at time.

## 2013-01-14 NOTE — ED Notes (Signed)
C/o generalized weakness and malaise since PNA tx. Pt denies SHOB, v/d.

## 2013-01-14 NOTE — ED Provider Notes (Signed)
History     CSN: [id number]  Arrival date & time 01/14/13  1659   First MD Initiated Contact with Patient 01/14/13 1804      Chief Complaint  Patient presents with  . Nausea  . Fatigue  . Weakness  . Shortness of Breath    (Consider location/radiation/quality/duration/timing/severity/associated sxs/prior treatment) Patient is a 67 y.o. female presenting with weakness, shortness of breath, and general illness. The history is provided by the patient.  Weakness Associated symptoms include fatigue, nausea and weakness. Pertinent negatives include no abdominal pain, arthralgias, chest pain, congestion, coughing, fever, headaches, rash or vomiting.  Shortness of Breath Associated symptoms: no abdominal pain, no chest pain, no cough, no fever, no headaches, no rash, no vomiting and no wheezing   Illness  The current episode started more than 2 weeks ago. The onset was gradual. The problem occurs frequently. The problem has been unchanged. The problem is moderate. Nothing relieves the symptoms. The symptoms are aggravated by activity and eating. Associated symptoms include nausea. Pertinent negatives include no fever, no photophobia, no abdominal pain, no diarrhea, no vomiting, no congestion, no headaches, no rhinorrhea, no muscle aches, no cough, no URI, no wheezing and no rash. She has been less active. She has been drinking less than usual and eating less than usual. Urine output has been normal. The last void occurred less than 6 hours ago. Recently, medical care has been given at this facility. Services received include medications given and tests performed.    Past Medical History  Diagnosis Date  . Crohn's disease   . Chronic diarrhea   . Joint pain   . GERD (gastroesophageal reflux disease)   . Polyarthralgia   . Shortness of breath   . Pneumonia   . Anemia   . Hernia (acquired) (recurrent)     hiatal  . Hiatal hernia   . Back pain   . Hemorrhoids     Past Surgical History   Procedure Laterality Date  . Bowel resection    . Colostomy      [name]  . Anal stenosis    . Colonoscopy  2000    needs to have one  . Cholecystectomy    . Appendectomy    . Goiter  1999  . Esophagogastroduodenoscopy  11/11/2011    Procedure: ESOPHAGOGASTRODUODENOSCOPY (EGD);  Surgeon: Barrie Folk, MD;  Location: [facility] ENDOSCOPY;  Service: Endoscopy;  Laterality: N/A;  . Flexible sigmoidoscopy  11/11/2011    Procedure: FLEXIBLE SIGMOIDOSCOPY;  Surgeon: Barrie Folk, MD;  Location: [facility] ENDOSCOPY;  Service: Endoscopy;  Laterality: N/A;  . Esophagogastroduodenoscopy  05/18/2012    Procedure: ESOPHAGOGASTRODUODENOSCOPY (EGD);  Surgeon: Malissa Hippo, MD;  Location: [facility] ENDO SUITE;  Service: Endoscopy;  Laterality: N/A;  200    Family History  Problem Relation Age of Onset  . Diabetes Mother   . Healthy Sister   . Hypertension Brother   . Colon cancer Brother     died with colon cancer    History  Substance Use Topics  . Smoking status: Former Smoker -- 0.25 packs/day for 4 years    Types: Cigarettes    Quit date: 10/05/1996  . Smokeless tobacco: Never Used     Comment: 1 pack every two weeks when she smoked  . Alcohol Use: No     Comment: denies    OB History   Grav Para Term Preterm Abortions TAB SAB Ect Mult Living  Review of Systems  Constitutional: Positive for appetite change and fatigue. Negative for fever.  HENT: Negative for congestion, rhinorrhea and postnasal drip.   Eyes: Negative for photophobia and visual disturbance.  Respiratory: Negative for cough, chest tightness, shortness of breath and wheezing.   Cardiovascular: Negative for chest pain, palpitations and leg swelling.  Gastrointestinal: Positive for nausea. Negative for vomiting, abdominal pain and diarrhea.  Genitourinary: Negative for urgency, frequency and difficulty urinating.  Musculoskeletal: Negative for back pain and arthralgias.  Skin: Negative for rash and wound.   Neurological: Positive for weakness. Negative for headaches.  Psychiatric/Behavioral: Negative for confusion and agitation.    Allergies  Aspirin and Sulfa antibiotics  Home Medications   Current Outpatient Rx  Name  Route  Sig  Dispense  Refill  . bifidobacterium infantis (ALIGN) capsule   Oral   Take 1 capsule by mouth daily.   30 capsule   5   . Calcium-Vitamin D 600-200 MG-UNIT per tablet   Oral   Take 1 tablet by mouth daily. Do not resume this medication until it has been cleared with your Gastroenterologist.   30 tablet   3   . dextromethorphan-guaiFENesin (MUCINEX DM) 30-600 MG per 12 hr tablet   Oral   Take 1 tablet by mouth 2 (two) times daily.   15 tablet   0   . dicyclomine (BENTYL) 10 MG/5ML syrup   Oral   Take 15 mg by mouth 3 (three) times daily as needed. For IBS         . HYDROcodone-acetaminophen (NORCO/VICODIN) 5-325 MG per tablet   Oral   Take 1 tablet by mouth every 6 (six) hours as needed. For pain         . loperamide (IMODIUM) 2 MG capsule   Oral   Take 1 capsule (2 mg total) by mouth 3 (three) times daily as needed for diarrhea or loose stools.   30 capsule   0   . multivitamin (THERAGRAN) per tablet   Oral   Take 1 tablet by mouth daily.   30 tablet   3   . pantoprazole (PROTONIX) 40 MG tablet   Oral   Take 1 tablet (40 mg total) by mouth daily.   30 tablet   5   . promethazine (PHENERGAN) 25 MG tablet   Oral   Take 1 tablet (25 mg total) by mouth every 6 (six) hours as needed for nausea.   60 tablet   3   . zolpidem (AMBIEN) 10 MG tablet   Oral   Take 1 tablet (10 mg total) by mouth as needed. insomnia   30 tablet   0   . ciprofloxacin (CIPRO) 500 MG tablet   Oral   Take 0.5 tablets (250 mg total) by mouth 2 (two) times daily.   6 tablet   0   . inFLIXimab (REMICADE) 100 MG injection   Intravenous   Inject 100 mg into the vein every 6 (six) weeks. Discuss with your gastroenterologist before resuming  remicade.   1 each   0   . potassium chloride SA (K-DUR,KLOR-CON) 20 MEQ tablet   Oral   Take 1 tablet (20 mEq total) by mouth daily.   6 tablet   0   . promethazine (PHENERGAN) 25 MG tablet   Oral   Take 1 tablet (25 mg total) by mouth every 6 (six) hours as needed for nausea.   15 tablet   0     BP 100/61  Pulse 95  Temp(Src) 97.9 F (36.6 C) (Oral)  Resp 19  SpO2 100%  Physical Exam  Nursing note and vitals reviewed. Constitutional: She is oriented to person, place, and time. She appears well-developed and well-nourished. No distress.  HENT:  Head: Normocephalic and atraumatic.  Mouth/Throat: Oropharynx is clear and moist.  Eyes: EOM are normal. Pupils are equal, round, and reactive to light.  Neck: Normal range of motion. Neck supple.  Cardiovascular: Normal rate, regular rhythm, normal heart sounds and intact distal pulses.   Pulmonary/Chest: Effort normal and breath sounds normal. She has no wheezes. She has no rales.  Abdominal: Soft. Bowel sounds are normal. She exhibits no distension. There is no tenderness. There is no rebound and no guarding.  Musculoskeletal: Normal range of motion. She exhibits no edema and no tenderness.  Lymphadenopathy:    She has no cervical adenopathy.  Neurological: She is alert and oriented to person, place, and time. She displays normal reflexes. No cranial nerve deficit. She exhibits normal muscle tone. Coordination normal.  Skin: Skin is warm and dry. No rash noted.  Psychiatric: She has a normal mood and affect. Her behavior is normal.    ED Course  Procedures (including critical care time)   Date: 01/14/2013  Rate: 96  Rhythm: normal sinus rhythm  QRS Axis: normal  Intervals: normal  ST/T Wave abnormalities: normal  Conduction Disutrbances:none  Narrative Interpretation:   Old EKG Reviewed: unchanged    Labs Reviewed  CBC WITH DIFFERENTIAL - Abnormal; Notable for the following:    WBC 11.3 (*)    HCT 35.3 (*)     Platelets 433 (*)    All other components within normal limits  COMPREHENSIVE METABOLIC PANEL - Abnormal; Notable for the following:    Sodium 134 (*)    Potassium 2.8 (*)    CO2 14 (*)    Creatinine, Ser 1.18 (*)    Total Protein 8.6 (*)    Albumin 3.3 (*)    GFR calc non Af Amer 47 (*)    GFR calc Af Amer 55 (*)    All other components within normal limits  URINALYSIS, ROUTINE W REFLEX MICROSCOPIC - Abnormal; Notable for the following:    APPearance CLOUDY (*)    Protein, ur 30 (*)    Leukocytes, UA LARGE (*)    All other components within normal limits  URINE MICROSCOPIC-ADD ON - Abnormal; Notable for the following:    Squamous Epithelial / LPF MANY (*)    Bacteria, UA FEW (*)    All other components within normal limits  URINE CULTURE  POCT I-STAT TROPONIN I  CG4 I-STAT (LACTIC ACID)   Dg Chest 2 View  01/14/2013  *RADIOLOGY REPORT*  Clinical Data: Nausea and weakness, shortness of breath  CHEST - 2 VIEW  Comparison: 12/29/2012  Findings: The large hiatal hernia is seen.  Cardiac shadow is stable.  Improved aeration is noted in the right lung base with some mild residual density.  No new focal abnormality is seen.  IMPRESSION: Improved aeration in the right lung base with some mild persistent changes identified.   Original Report Authenticated By: Alcide Clever, M.D.      1. Dehydration   2. UTI (lower urinary tract infection)       MDM  31F with pmhx of Chron's disease and recent discharge from this hospital for pneumonia here with generalized weakness, fatigue, and nausea that has been persistent since her hospital discharge last week. She is no longer  on antibiotics. No recurrent fever. Still has a cough but that's improving. No vomiting or diarrhea. Exam as noted above. BP on arrival 88/61. Looking through her recent hospital admission, her BP ranged from 90's-100 persistently. Pt states that she "always runs low." No focal neurologic findings. Not hypoxic or tachycardic.  Does not appear severely dehydrated. Labs reveal stable Hgb. Hypokalemia 2.8. Stable renal function. Will give some IVF and add on a lactate to see if she's hypoperfusing. Will also give dose of Kdur 40 mEq here and phenergan for nausea. If lactate normal and patient feeling better along with an improved BP, she could probably go home as patient states she really doesn't want to stay in the hospital.  Lactate normal BP has remained upper 90's since arrival. Pt tolerating PO. Feeling better. Urine c/w possible infection. Previous urine culture showed Klebsiella sensitive to cipro, which is what I will prescribe for her. Felt stable for d/c home. Return precautions given.      Johnnette Gourd, MD 01/15/13 [phone]

## 2013-01-15 NOTE — ED Provider Notes (Signed)
I saw and evaluated the patient, reviewed the resident's note and I agree with the findings and plan. I have reviewed EKG and agree with the resident interpretation.  Patient with recent hospitalization due to pneumonia who has been at home but is feeling weak and run down. On arrival here mildly hypotensive which resolved after IV fluids. Lactate within normal limits chest x-ray shows improving pneumonia and patient denies any symptoms concerning for worsening pneumonia. UA suggestive of UTI and mild hypokalemia today. Patient is able to ambulate but is otherwise well-appearing. She has no abdominal pain and is wanting to go home   Gwyneth Sprout, MD 01/15/13 1705

## 2013-01-17 DIAGNOSIS — D649 Anemia, unspecified: Secondary | ICD-10-CM | POA: Diagnosis not present

## 2013-01-17 DIAGNOSIS — D510 Vitamin B12 deficiency anemia due to intrinsic factor deficiency: Secondary | ICD-10-CM | POA: Diagnosis not present

## 2013-01-17 LAB — URINE CULTURE: Colony Count: >=100000

## 2013-01-18 NOTE — ED Notes (Signed)
+   Urine Patient treated with Cipro-sensitive to same-chart appended per protocol MD. 

## 2013-01-23 ENCOUNTER — Ambulatory Visit (INDEPENDENT_AMBULATORY_CARE_PROVIDER_SITE_OTHER): Payer: Medicare Other | Admitting: Internal Medicine

## 2013-01-23 ENCOUNTER — Encounter (INDEPENDENT_AMBULATORY_CARE_PROVIDER_SITE_OTHER): Payer: Self-pay | Admitting: Internal Medicine

## 2013-01-23 VITALS — BP 94/70 | HR 74 | Temp 96.7°F | Resp 16 | Ht 64.0 in | Wt 127.3 lb

## 2013-01-23 DIAGNOSIS — R630 Anorexia: Secondary | ICD-10-CM | POA: Diagnosis not present

## 2013-01-23 DIAGNOSIS — R634 Abnormal weight loss: Secondary | ICD-10-CM

## 2013-01-23 DIAGNOSIS — K5090 Crohn's disease, unspecified, without complications: Secondary | ICD-10-CM | POA: Diagnosis not present

## 2013-01-23 DIAGNOSIS — G4700 Insomnia, unspecified: Secondary | ICD-10-CM

## 2013-01-23 DIAGNOSIS — M2550 Pain in unspecified joint: Secondary | ICD-10-CM | POA: Diagnosis not present

## 2013-01-23 DIAGNOSIS — E876 Hypokalemia: Secondary | ICD-10-CM

## 2013-01-23 DIAGNOSIS — R110 Nausea: Secondary | ICD-10-CM

## 2013-01-23 MED ORDER — MEGESTROL ACETATE 40 MG/ML PO SUSP: 200.0000 mg | Freq: Every day | ORAL | Status: DC

## 2013-01-23 MED ORDER — ZOLPIDEM TARTRATE 10 MG PO TABS: 5.0000 mg | ORAL_TABLET | ORAL | Status: DC | PRN

## 2013-01-23 MED ORDER — HYDROCODONE-ACETAMINOPHEN 5-325 MG PO TABS: 1.0000 | ORAL_TABLET | Freq: Two times a day (BID) | ORAL | Status: DC | PRN

## 2013-01-23 MED ORDER — POTASSIUM CHLORIDE CRYS ER 20 MEQ PO TBCR: 20.0000 meq | EXTENDED_RELEASE_TABLET | Freq: Every day | ORAL | Status: DC

## 2013-01-23 MED ORDER — ONDANSETRON HCL 4 MG PO TABS: 4.0000 mg | ORAL_TABLET | Freq: Two times a day (BID) | ORAL | Status: DC | PRN

## 2013-01-23 NOTE — Patient Instructions (Signed)
Discontinue promethazine or Phenergan. New medication is ondansetron 4 mg twice daily as needed for nausea. Please note dose reduction and zolpidem 5 mg daily at bedtime as needed.

## 2013-01-23 NOTE — Progress Notes (Signed)
Presenting complaint;  Followup for Crohn's disease. Patient has multiple other symptoms.  Subjective:  Patient is 67 year old African female who is here for scheduled visit. She was last seen in July 2013. Some of her appointments had to be moved because she was sick and not able to make it. She was admitted to [facility] between January 19 and 26 2014 for pneumonia urinary tract infection and dehydration. She feels weak. She complains of anorexia and continuing to lose weight. She has lost 30 pounds since her last visit of July 2013. She states she is status of take frothy clear liquid but she denies regurgitation, heartburn or dysphagia. She does experience nausea at times. She took Phenergan last week and feels woozy today. She has not been febrile since he was discharged from the hospital. She denies abdominal pain melena or rectal bleeding. She is having about 3 stools per day. Stools are watery to semi-formed and at times in bits and pieces. Last dose of Remicade was on 10/31/2012. She is having pain in small joints of both hands and wrists as well as shoulders. Previously she has felt better with Remicade. Her sister tells me that they have recently relocated. She believes her recurrent illness may have been related to poor ventilation in the house that they were living.  Current Medications: Current Outpatient Prescriptions  Medication Sig Dispense Refill  . bifidobacterium infantis (ALIGN) capsule Take 1 capsule by mouth daily.  30 capsule  5  . Calcium-Vitamin D 600-200 MG-UNIT per tablet Take 1 tablet by mouth daily. Do not resume this medication until it has been cleared with your Gastroenterologist.  30 tablet  3  . ciprofloxacin (CIPRO) 500 MG tablet Take 0.5 tablets (250 mg total) by mouth 2 (two) times daily.  6 tablet  0  . dextromethorphan-guaiFENesin (MUCINEX DM) 30-600 MG per 12 hr tablet Take 1 tablet by mouth 2 (two) times daily.  15 tablet  0  . dicyclomine (BENTYL) 10 MG/5ML  syrup Take 15 mg by mouth 3 (three) times daily as needed. For IBS      . loperamide (IMODIUM) 2 MG capsule Take 1 capsule (2 mg total) by mouth 3 (three) times daily as needed for diarrhea or loose stools.  30 capsule  0  . multivitamin (THERAGRAN) per tablet Take 1 tablet by mouth daily.  30 tablet  3  . pantoprazole (PROTONIX) 40 MG tablet Take 1 tablet (40 mg total) by mouth daily.  30 tablet  5  . potassium chloride SA (K-DUR,KLOR-CON) 20 MEQ tablet Take 1 tablet (20 mEq total) by mouth daily.  6 tablet  0  . promethazine (PHENERGAN) 25 MG tablet Take 1 tablet (25 mg total) by mouth every 6 (six) hours as needed for nausea.  60 tablet  3  . zolpidem (AMBIEN) 10 MG tablet Take 1 tablet (10 mg total) by mouth as needed. insomnia  30 tablet  0  . HYDROcodone-acetaminophen (NORCO/VICODIN) 5-325 MG per tablet Take 1 tablet by mouth every 6 (six) hours as needed. For pain      . inFLIXimab (REMICADE) 100 MG injection Inject 100 mg into the vein every 6 (six) weeks. Discuss with your gastroenterologist before resuming remicade.  1 each  0   No current facility-administered medications for this visit.     Objective: Blood pressure 94/70, pulse 74, temperature 96.7 F (35.9 C), temperature source Oral, resp. rate 16, height 5\' 4"  (1.626 m), weight 127 lb 4.8 oz (57.743 kg). Patient is alert and  appears to be in no acute distress. She appears chronically ill. Conjunctiva is pink. Sclera is nonicteric Oropharyngeal mucosa is normal. No neck masses or thyromegaly noted. Cardiac exam with regular rhythm normal S1 and S2. No murmur or gallop noted. Lungs are clear to auscultation. Abdomen is soft and nontender without organomegaly or masses.  No LE edema or clubbing noted.  Labs/studies Results: Lab data from 01/14/2013. Serum potassium was 2.8. H&H 12.1 and 35.3.  Assessment:  #1. Crohn's disease. She has ileal and anorectal Crohn's disease which appears to be in remission. #2. Anorexia  and weight loss secondary to recent illness and antibiotics. #3. History of hypokalemia. #4. 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 She cannot take NSAIDs on account of IBD. #5. GERD she is not having typical symptoms of GERD. #6. High-risk medications include promethazine and zolpidem.   Plan:  Will request copy of recent blood work from Dr. Adaline Sill office. New prescription for KCL 20 mEq by mouth daily issued. Decrease zolpidem to 5 mg by mouth each bedtime when necessary. Discontinue promethazine. Use ondansetron 4 mg by mouth twice a day when necessary new prescription given. Megace 200 mg by mouth daily. Acrochordon/acetaminophen 5/325 by mouth twice a day when necessary. 60 doses without a refill. Office visit in one month.

## 2013-01-30 ENCOUNTER — Telehealth (INDEPENDENT_AMBULATORY_CARE_PROVIDER_SITE_OTHER): Payer: Self-pay | Admitting: *Deleted

## 2013-01-30 NOTE — Telephone Encounter (Signed)
Kimba would like to speak with [name] about medications received in the hospital. She is needing a refill on them and was advised by PCP to call Dr. Patty Sermons office. Kaidence didn't know the name of the both medications when Lupita Leash spoke with her. The return phone number is [phone].

## 2013-01-31 NOTE — Telephone Encounter (Signed)
Per Dr.Rehman may call in Lomotil - Patient to take 1 by mouth three times a day #90 no refills. This was called to [facility] Patient called and made aware.

## 2013-01-31 NOTE — Telephone Encounter (Signed)
Orpah states that while she was in the hospital she was given Imodium for her diarrhea. Since being home she has purchased the OTC Imodium and she states that this is not working. She spoke with Dr.Hill about this and he ask her to call Dr.Rehman and see if he would write a prescription for the Lomotil. Nolan also ask if Dr.Rehman heard from her lab work. States that they called her and told her that everything was perfect, potassium was up. Patient advised that this would be discussed with Dr.Rehman and we would call her back.

## 2013-02-08 DIAGNOSIS — J189 Pneumonia, unspecified organism: Secondary | ICD-10-CM | POA: Diagnosis not present

## 2013-02-08 DIAGNOSIS — K5000 Crohn's disease of small intestine without complications: Secondary | ICD-10-CM | POA: Diagnosis not present

## 2013-02-17 ENCOUNTER — Other Ambulatory Visit (INDEPENDENT_AMBULATORY_CARE_PROVIDER_SITE_OTHER): Payer: Self-pay | Admitting: Internal Medicine

## 2013-02-20 ENCOUNTER — Other Ambulatory Visit (INDEPENDENT_AMBULATORY_CARE_PROVIDER_SITE_OTHER): Payer: Self-pay | Admitting: Internal Medicine

## 2013-02-20 ENCOUNTER — Telehealth (INDEPENDENT_AMBULATORY_CARE_PROVIDER_SITE_OTHER): Payer: Self-pay | Admitting: *Deleted

## 2013-02-20 ENCOUNTER — Ambulatory Visit (INDEPENDENT_AMBULATORY_CARE_PROVIDER_SITE_OTHER): Payer: Medicare Other | Admitting: Internal Medicine

## 2013-02-20 NOTE — Telephone Encounter (Signed)
Danielle [name] said she didn't have any transportation to her doctors apt this morning. She did reschedule to next Monday, 02/27/13. Needs her Lomotil and Hydrocodone refilled. Would like for [name] to return the call to [phone].

## 2013-02-20 NOTE — Telephone Encounter (Signed)
Doctor Karilyn Cota has addressed. Unable to call patient and the Pharmacy due to phone lines being not connected. I will attempt to call 02/21/13.

## 2013-02-20 NOTE — Telephone Encounter (Signed)
Prescriptions called in to the [facility] per Dr.Rehman.

## 2013-02-24 ENCOUNTER — Encounter (HOSPITAL_COMMUNITY): Payer: Self-pay | Admitting: *Deleted

## 2013-02-24 ENCOUNTER — Emergency Department (HOSPITAL_COMMUNITY): Payer: Medicare Other

## 2013-02-24 ENCOUNTER — Inpatient Hospital Stay (HOSPITAL_COMMUNITY): Payer: Medicare Other

## 2013-02-24 ENCOUNTER — Other Ambulatory Visit: Payer: Self-pay

## 2013-02-24 ENCOUNTER — Inpatient Hospital Stay (HOSPITAL_COMMUNITY)
Admission: EM | Admit: 2013-02-24 | Discharge: 2013-02-27 | DRG: 641 | Disposition: A | Discharge status: Home / Self Care | Payer: Medicare Other | Attending: Internal Medicine | Admitting: Internal Medicine

## 2013-02-24 DIAGNOSIS — E860 Dehydration: Secondary | ICD-10-CM | POA: Diagnosis not present

## 2013-02-24 DIAGNOSIS — Z8249 Family history of ischemic heart disease and other diseases of the circulatory system: Secondary | ICD-10-CM

## 2013-02-24 DIAGNOSIS — E872 Acidosis, unspecified: Secondary | ICD-10-CM

## 2013-02-24 DIAGNOSIS — Z833 Family history of diabetes mellitus: Secondary | ICD-10-CM

## 2013-02-24 DIAGNOSIS — M2550 Pain in unspecified joint: Secondary | ICD-10-CM | POA: Diagnosis present

## 2013-02-24 DIAGNOSIS — Z882 Allergy status to sulfonamides status: Secondary | ICD-10-CM

## 2013-02-24 DIAGNOSIS — D638 Anemia in other chronic diseases classified elsewhere: Secondary | ICD-10-CM | POA: Diagnosis present

## 2013-02-24 DIAGNOSIS — E871 Hypo-osmolality and hyponatremia: Secondary | ICD-10-CM | POA: Diagnosis present

## 2013-02-24 DIAGNOSIS — K5090 Crohn's disease, unspecified, without complications: Secondary | ICD-10-CM

## 2013-02-24 DIAGNOSIS — N179 Acute kidney failure, unspecified: Secondary | ICD-10-CM

## 2013-02-24 DIAGNOSIS — R0602 Shortness of breath: Secondary | ICD-10-CM | POA: Diagnosis not present

## 2013-02-24 DIAGNOSIS — D649 Anemia, unspecified: Secondary | ICD-10-CM

## 2013-02-24 DIAGNOSIS — R197 Diarrhea, unspecified: Secondary | ICD-10-CM

## 2013-02-24 DIAGNOSIS — K449 Diaphragmatic hernia without obstruction or gangrene: Secondary | ICD-10-CM | POA: Diagnosis present

## 2013-02-24 DIAGNOSIS — Z79899 Other long term (current) drug therapy: Secondary | ICD-10-CM

## 2013-02-24 DIAGNOSIS — F17200 Nicotine dependence, unspecified, uncomplicated: Secondary | ICD-10-CM | POA: Diagnosis present

## 2013-02-24 DIAGNOSIS — R7989 Other specified abnormal findings of blood chemistry: Secondary | ICD-10-CM

## 2013-02-24 DIAGNOSIS — Z933 Colostomy status: Secondary | ICD-10-CM

## 2013-02-24 DIAGNOSIS — IMO0002 Reserved for concepts with insufficient information to code with codable children: Secondary | ICD-10-CM | POA: Diagnosis not present

## 2013-02-24 DIAGNOSIS — E876 Hypokalemia: Principal | ICD-10-CM

## 2013-02-24 DIAGNOSIS — M25559 Pain in unspecified hip: Secondary | ICD-10-CM | POA: Diagnosis not present

## 2013-02-24 DIAGNOSIS — R0609 Other forms of dyspnea: Secondary | ICD-10-CM

## 2013-02-24 DIAGNOSIS — M6281 Muscle weakness (generalized): Secondary | ICD-10-CM | POA: Diagnosis not present

## 2013-02-24 DIAGNOSIS — K219 Gastro-esophageal reflux disease without esophagitis: Secondary | ICD-10-CM | POA: Diagnosis present

## 2013-02-24 DIAGNOSIS — Z800 Family history of malignant neoplasm of digestive organs: Secondary | ICD-10-CM

## 2013-02-24 DIAGNOSIS — Z886 Allergy status to analgesic agent status: Secondary | ICD-10-CM

## 2013-02-24 DIAGNOSIS — R5381 Other malaise: Secondary | ICD-10-CM | POA: Diagnosis present

## 2013-02-24 DIAGNOSIS — M79609 Pain in unspecified limb: Secondary | ICD-10-CM | POA: Diagnosis not present

## 2013-02-24 DIAGNOSIS — R748 Abnormal levels of other serum enzymes: Secondary | ICD-10-CM | POA: Diagnosis not present

## 2013-02-24 LAB — CBC WITH DIFFERENTIAL/PLATELET
Basophils Absolute: 0 10*3/uL (ref 0.0–0.1)
Lymphocytes Relative: 18 % (ref 12–46)
Lymphs Abs: 2.1 10*3/uL (ref 0.7–4.0)
Neutro Abs: 8.9 10*3/uL — ABNORMAL HIGH (ref 1.7–7.7)
Neutrophils Relative %: 74 % (ref 43–77)
Platelets: 380 10*3/uL (ref 150–400)
RBC: 4.07 MIL/uL (ref 3.87–5.11)
RDW: 16.1 % — ABNORMAL HIGH (ref 11.5–15.5)
WBC: 12 10*3/uL — ABNORMAL HIGH (ref 4.0–10.5)

## 2013-02-24 LAB — BASIC METABOLIC PANEL
CO2: 8 mEq/L — CL (ref 19–32)
Chloride: 101 mEq/L (ref 96–112)
GFR calc Af Amer: 43 mL/min — ABNORMAL LOW (ref >90)
Sodium: 128 mEq/L — ABNORMAL LOW (ref 135–145)

## 2013-02-24 LAB — POCT I-STAT, CHEM 8
Calcium, Ion: 1.11 mmol/L — ABNORMAL LOW (ref 1.13–1.30)
Chloride: 110 mEq/L (ref 96–112)
HCT: 35 % — ABNORMAL LOW (ref 36.0–46.0)
Hemoglobin: 11.9 g/dL — ABNORMAL LOW (ref 12.0–15.0)
Potassium: 2.1 mEq/L — CL (ref 3.5–5.1)

## 2013-02-24 LAB — D-DIMER, QUANTITATIVE: D-Dimer, Quant: 2.38 ug/mL-FEU — ABNORMAL HIGH (ref 0.00–0.48)

## 2013-02-24 LAB — GLUCOSE, CAPILLARY

## 2013-02-24 LAB — TROPONIN I: Troponin I: <0.3 ng/mL (ref <0.30)

## 2013-02-24 MED ORDER — PANTOPRAZOLE SODIUM 40 MG PO TBEC
40.0000 mg | DELAYED_RELEASE_TABLET | Freq: Every day | ORAL | Status: DC
  Administered 2013-02-25 – 2013-02-27 (×3): 40 mg via ORAL
  Filled 2013-02-24 (×3): qty 1

## 2013-02-24 MED ORDER — HYDROCODONE-ACETAMINOPHEN 5-325 MG PO TABS
1.0000 | ORAL_TABLET | Freq: Four times a day (QID) | ORAL | Status: DC | PRN
  Administered 2013-02-24 – 2013-02-27 (×4): 2 via ORAL
  Filled 2013-02-24 (×4): qty 2

## 2013-02-24 MED ORDER — TECHNETIUM TO 99M ALBUMIN AGGREGATED
6.0000 | Freq: Once | INTRAVENOUS | Status: AC | PRN
  Administered 2013-02-24: 6 via INTRAVENOUS

## 2013-02-24 MED ORDER — HEPARIN SODIUM (PORCINE) 5000 UNIT/ML IJ SOLN
5000.0000 [IU] | Freq: Three times a day (TID) | INTRAMUSCULAR | Status: DC
  Administered 2013-02-24 – 2013-02-27 (×8): 5000 [IU] via SUBCUTANEOUS
  Filled 2013-02-24 (×11): qty 1

## 2013-02-24 MED ORDER — SODIUM CHLORIDE 0.9 % IV BOLUS (SEPSIS): 1000.0000 mL | Freq: Once | INTRAVENOUS | Status: DC

## 2013-02-24 MED ORDER — ALBUTEROL SULFATE (5 MG/ML) 0.5% IN NEBU
2.5000 mg | INHALATION_SOLUTION | Freq: Three times a day (TID) | RESPIRATORY_TRACT | Status: DC
  Administered 2013-02-24 – 2013-02-27 (×7): 2.5 mg via RESPIRATORY_TRACT
  Filled 2013-02-24 (×8): qty 0.5

## 2013-02-24 MED ORDER — RISAQUAD PO CAPS
1.0000 | ORAL_CAPSULE | Freq: Every day | ORAL | Status: DC
  Administered 2013-02-25 – 2013-02-27 (×3): 1 via ORAL
  Filled 2013-02-24 (×3): qty 1

## 2013-02-24 MED ORDER — ALUM & MAG HYDROXIDE-SIMETH 200-200-20 MG/5ML PO SUSP: 30.0000 mL | Freq: Four times a day (QID) | ORAL | Status: DC | PRN

## 2013-02-24 MED ORDER — SODIUM CHLORIDE 0.9 % IJ SOLN: 3.0000 mL | Freq: Two times a day (BID) | INTRAMUSCULAR | Status: DC

## 2013-02-24 MED ORDER — POTASSIUM CHLORIDE CRYS ER 20 MEQ PO TBCR
80.0000 meq | EXTENDED_RELEASE_TABLET | Freq: Once | ORAL | Status: AC
  Administered 2013-02-24: 80 meq via ORAL
  Filled 2013-02-24: qty 4

## 2013-02-24 MED ORDER — ONDANSETRON HCL 4 MG/2ML IJ SOLN: 4.0000 mg | Freq: Four times a day (QID) | INTRAMUSCULAR | Status: DC | PRN

## 2013-02-24 MED ORDER — LOPERAMIDE HCL 2 MG PO CAPS
2.0000 mg | ORAL_CAPSULE | Freq: Three times a day (TID) | ORAL | Status: DC | PRN
  Administered 2013-02-25 – 2013-02-27 (×5): 2 mg via ORAL
  Filled 2013-02-24 (×5): qty 1

## 2013-02-24 MED ORDER — GUAIFENESIN-DM 100-10 MG/5ML PO SYRP
5.0000 mL | ORAL_SOLUTION | ORAL | Status: DC | PRN
Start: 2013-02-24 — End: 2013-02-27

## 2013-02-24 MED ORDER — POTASSIUM CHLORIDE 10 MEQ/100ML IV SOLN
10.0000 meq | INTRAVENOUS | Status: AC
  Administered 2013-02-24 (×3): 10 meq via INTRAVENOUS
  Filled 2013-02-24 (×3): qty 100

## 2013-02-24 MED ORDER — PREDNISONE 20 MG PO TABS
40.0000 mg | ORAL_TABLET | Freq: Every day | ORAL | Status: DC
  Administered 2013-02-25 – 2013-02-27 (×3): 40 mg via ORAL
  Filled 2013-02-24 (×4): qty 2

## 2013-02-24 MED ORDER — TECHNETIUM TC 99M DIETHYLENETRIAME-PENTAACETIC ACID: 40.0000 | Freq: Once | INTRAVENOUS | Status: AC | PRN

## 2013-02-24 MED ORDER — ALBUTEROL SULFATE (5 MG/ML) 0.5% IN NEBU
2.5000 mg | INHALATION_SOLUTION | Freq: Once | RESPIRATORY_TRACT | Status: AC
  Administered 2013-02-24: 2.5 mg via RESPIRATORY_TRACT
  Filled 2013-02-24: qty 0.5

## 2013-02-24 MED ORDER — ALBUTEROL SULFATE (5 MG/ML) 0.5% IN NEBU: 2.5000 mg | INHALATION_SOLUTION | RESPIRATORY_TRACT | Status: DC | PRN

## 2013-02-24 MED ORDER — HYDROMORPHONE HCL PF 1 MG/ML IJ SOLN
0.5000 mg | Freq: Once | INTRAMUSCULAR | Status: AC
  Administered 2013-02-24: 0.5 mg via INTRAMUSCULAR
  Filled 2013-02-24: qty 1

## 2013-02-24 MED ORDER — PREDNISONE 20 MG PO TABS
40.0000 mg | ORAL_TABLET | Freq: Once | ORAL | Status: AC
  Administered 2013-02-24: 40 mg via ORAL
  Filled 2013-02-24: qty 2

## 2013-02-24 MED ORDER — ZOLPIDEM TARTRATE 5 MG PO TABS
5.0000 mg | ORAL_TABLET | Freq: Every evening | ORAL | Status: DC | PRN
  Administered 2013-02-24 – 2013-02-26 (×3): 5 mg via ORAL
  Filled 2013-02-24 (×3): qty 1

## 2013-02-24 MED ORDER — MEGESTROL ACETATE 40 MG/ML PO SUSP
200.0000 mg | Freq: Every day | ORAL | Status: DC
Start: 2013-02-25 — End: 2013-02-27
  Administered 2013-02-25 – 2013-02-27 (×3): 200 mg via ORAL
  Filled 2013-02-24 (×3): qty 5

## 2013-02-24 MED ORDER — ACETAMINOPHEN 650 MG RE SUPP: 650.0000 mg | Freq: Four times a day (QID) | RECTAL | Status: DC | PRN

## 2013-02-24 MED ORDER — POTASSIUM CHLORIDE IN NACL 20-0.9 MEQ/L-% IV SOLN
Freq: Once | INTRAVENOUS | Status: DC
  Filled 2013-02-24: qty 1000

## 2013-02-24 MED ORDER — IPRATROPIUM BROMIDE 0.02 % IN SOLN
0.5000 mg | Freq: Once | RESPIRATORY_TRACT | Status: AC
  Administered 2013-02-24: 0.5 mg via RESPIRATORY_TRACT
  Filled 2013-02-24: qty 2.5

## 2013-02-24 MED ORDER — CALCIUM-VITAMIN D 600-200 MG-UNIT PO TABS: 1.0000 | ORAL_TABLET | Freq: Every day | ORAL | Status: DC

## 2013-02-24 MED ORDER — ACETAMINOPHEN 325 MG PO TABS: 650.0000 mg | ORAL_TABLET | Freq: Four times a day (QID) | ORAL | Status: DC | PRN

## 2013-02-24 MED ORDER — CALCIUM CARBONATE-VITAMIN D 500-200 MG-UNIT PO TABS
1.0000 | ORAL_TABLET | Freq: Every day | ORAL | Status: DC
  Administered 2013-02-25 – 2013-02-27 (×3): 1 via ORAL
  Filled 2013-02-24 (×3): qty 1

## 2013-02-24 MED ORDER — ONDANSETRON HCL 4 MG PO TABS: 4.0000 mg | ORAL_TABLET | Freq: Four times a day (QID) | ORAL | Status: DC | PRN

## 2013-02-24 MED ORDER — ADULT MULTIVITAMIN W/MINERALS CH
1.0000 | ORAL_TABLET | Freq: Every day | ORAL | Status: DC
  Administered 2013-02-25 – 2013-02-27 (×3): 1 via ORAL
  Filled 2013-02-24 (×3): qty 1

## 2013-02-24 NOTE — ED Notes (Signed)
Pt reports sob x 1 week, having pain to entire body. Denies recent cough. Airway intact at triage, spo2 99%

## 2013-02-24 NOTE — ED Notes (Signed)
Pt requesting for IV team to stick her. IV team paged.

## 2013-02-24 NOTE — ED Notes (Signed)
IV team called per pt request

## 2013-02-24 NOTE — Discharge Summary (Addendum)
PATIENT DETAILS Name: Danielle [name] Age: 67 y.o. Sex: female Date of Birth: 19-Jul-1946 Admit Date: 02/24/2013 ZOX:[id number] K, MD   CHIEF COMPLAINT:  Exertional shortness of breath for a week  HPI: Patient is a 67 year old Caucasian female with a past medical history of Crohn's disease previously was on Remicade to 3 months ago, polyarthralgias, history of intra-abdominal strictures from resumed Crohn's disease who presents to the hospital for the above-noted complaints. Apparently approximately 4 weeks patient has been feeling short of breath, shortness of breath is exclusively on exertion, she denies any shortness of breath at rest. She has one pillow orthopnea, there is no history of PND. There is no history of leg edema. Because the symptoms were persistent, she was brought to the emergency room for further evaluation. She also complains of generalized weakness, along with pain in her left palmer aspect of the wrist, bilateral hip pain, and posterior knee pain. -He has a known history of Crohn's disease and has approximately 4-5 loose stools every day at baseline. She denies any fever or nausea vomiting. She is now being admitted to the hospital for further evaluation and treatment.   ALLERGIES:   Allergies  Allergen Reactions  . Aspirin Other (See Comments)    Cramps her stomach  . Sulfa Antibiotics Nausea And Vomiting    PAST MEDICAL HISTORY: Past Medical History  Diagnosis Date  . Crohn's disease   . Chronic diarrhea   . Joint pain   . GERD (gastroesophageal reflux disease)   . Polyarthralgia   . Shortness of breath   . Pneumonia   . Anemia   . Hernia (acquired) (recurrent)     hiatal  . Hiatal hernia   . Back pain   . Hemorrhoids     PAST SURGICAL HISTORY: Past Surgical History  Procedure Laterality Date  . Bowel resection    . Colostomy      [name]  . Anal stenosis    . Colonoscopy  2000    needs to have one  . Cholecystectomy    . Appendectomy     . Goiter  1999  . Esophagogastroduodenoscopy  11/11/2011    Procedure: ESOPHAGOGASTRODUODENOSCOPY (EGD);  Surgeon: Barrie Folk, MD;  Location: [facility] ENDOSCOPY;  Service: Endoscopy;  Laterality: N/A;  . Flexible sigmoidoscopy  11/11/2011    Procedure: FLEXIBLE SIGMOIDOSCOPY;  Surgeon: Barrie Folk, MD;  Location: [facility] ENDOSCOPY;  Service: Endoscopy;  Laterality: N/A;  . Esophagogastroduodenoscopy  05/18/2012    Procedure: ESOPHAGOGASTRODUODENOSCOPY (EGD);  Surgeon: Malissa Hippo, MD;  Location: [facility] ENDO SUITE;  Service: Endoscopy;  Laterality: N/A;  200    MEDICATIONS AT HOME: Prior to Admission medications   Medication Sig Start Date End Date Taking? Authorizing Provider  bifidobacterium infantis (ALIGN) capsule Take 1 capsule by mouth daily. 06/28/12 06/28/13 Yes Malissa Hippo, MD  Calcium-Vitamin D 600-200 MG-UNIT per tablet Take 1 tablet by mouth daily. Do not resume this medication until it has been cleared with your Gastroenterologist. 05/24/12  Yes Malissa Hippo, MD  Cyanocobalamin (VITAMIN B-12 IJ) Inject 1 each as directed every 30 (thirty) days.   Yes Historical Provider, MD  diphenoxylate-atropine (LOMOTIL) 2.5-0.025 MG per tablet TAKE 1 TABLET 3 TIMES A DAY 02/17/13  Yes Malissa Hippo, MD  HYDROcodone-acetaminophen (NORCO/VICODIN) 5-325 MG per tablet TAKE 1 TABLET BY MOUTH 2 TIMES DAILY AS NEEDED FOR PAIN 02/20/13  Yes Malissa Hippo, MD  loperamide (IMODIUM) 2 MG capsule Take 1 capsule (2 mg total) by mouth  3 (three) times daily as needed for diarrhea or loose stools. 01/01/13  Yes Elease Etienne, MD  megestrol (MEGACE ORAL) 40 MG/ML suspension Take 5 mLs (200 mg total) by mouth daily. 01/23/13  Yes Malissa Hippo, MD  multivitamin [facility]) per tablet Take 1 tablet by mouth daily. 12/18/11  Yes Alison Murray, MD  nystatin-triamcinolone (MYCOLOG II) cream Apply 1 application topically daily.   Yes Historical Provider, MD  ondansetron (ZOFRAN) 4 MG tablet Take 1 tablet (4 mg total) by  mouth every 12 (twelve) hours as needed for nausea. 01/23/13  Yes Malissa Hippo, MD  pantoprazole (PROTONIX) 40 MG tablet Take 1 tablet (40 mg total) by mouth daily. 12/18/11  Yes Alison Murray, MD  potassium chloride SA (K-DUR,KLOR-CON) 20 MEQ tablet Take 1 tablet (20 mEq total) by mouth daily. 01/23/13  Yes Malissa Hippo, MD  zolpidem (AMBIEN) 10 MG tablet Take 0.5 tablets (5 mg total) by mouth as needed. insomnia 01/23/13  Yes Malissa Hippo, MD    FAMILY HISTORY: Family History  Problem Relation Age of Onset  . Diabetes Mother   . Healthy Sister   . Hypertension Brother   . Colon cancer Brother     died with colon cancer    SOCIAL HISTORY:  reports that she quit smoking about 16 years ago. Her smoking use included Cigarettes. She has a 1 pack-year smoking history. She has never used smokeless tobacco. She reports that she does not drink alcohol or use illicit drugs.  REVIEW OF SYSTEMS:  Constitutional:   No  weight loss, night sweats,  Fevers, chills  HEENT:    No headaches, Difficulty swallowing,Tooth/dental problems,Sore throat,  No sneezing, itching, ear ache, nasal congestion, post nasal drip,   Cardio-vascular: No chest pain,  Orthopnea, PND, swelling in lower extremities, anasarca,  dizziness, palpitations  GI:  No heartburn, indigestion, abdominal pain, nausea, vomiting  Resp: No shortness of breath with rest.  No excess mucus, no productive cough, No non-productive cough,  No coughing up of blood.No change in color of mucus.No wheezing.No chest wall deformity  Skin:  no rash or lesions.  GU:  no dysuria, change in color of urine, no urgency or frequency.  No flank pain.  Musculoskeletal:  No decreased range of motion.  No back pain.  Psych: No change in mood or affect. No depression or anxiety.  No memory loss.   PHYSICAL EXAM: Blood pressure 90/56, pulse 99, resp. rate 20, SpO2 100.00%.  General appearance :Awake, alert, not in any distress. Speech  Clear. Not toxic Looking HEENT: Atraumatic and Normocephalic, pupils equally reactive to light and accomodation Neck: supple, no JVD. No cervical lymphadenopathy.  Chest:Good air entry bilaterally, no added sounds  CVS: S1 S2 regular, no murmurs.  Abdomen: Bowel sounds present, Non tender and not distended with no gaurding, rigidity or rebound. Midline incision scar seen Extremities: B/L Lower Ext shows no edema, both legs are warm to touch. There is no obvious swelling of the lower extremities, there is no effusion noted in bilateral knee areas. Neurology: Awake alert, and oriented X 3, CN II-XII intact, Non focal Skin:No Rash. She does have a small area erythema in the lateral aspect of the wrist area-at the palmar region Wounds:N/A  LABS ON ADMISSION:   Recent Labs  02/24/13 1250 02/24/13 1558  NA 128* 134*  K <2.0* 2.1*  CL 101 110  CO2 8*  --   GLUCOSE 91 122*  BUN 20 19  CREATININE  1.45* 1.50*  CALCIUM 8.3*  --    No results found for this basename: AST, ALT, ALKPHOS, BILITOT, PROT, ALBUMIN,  in the last 72 hours No results found for this basename: LIPASE, AMYLASE,  in the last 72 hours  Recent Labs  02/24/13 1216 02/24/13 1558  WBC 12.0*  --   NEUTROABS 8.9*  --   HGB 11.4* 11.9*  HCT 31.3* 35.0*  MCV 76.9*  --   PLT 380  --     Recent Labs  02/24/13 1315  TROPONINI <0.30    Recent Labs  02/24/13 1409  DDIMER 2.38*   No components found with this basename: POCBNP,    RADIOLOGIC STUDIES ON ADMISSION: Dg Chest 2 View  02/24/2013  *RADIOLOGY REPORT*  Clinical Data: Shortness of breath  CHEST - 2 VIEW  Comparison: 01/14/2013  Findings: Cardiac shadow is stable.  A large hiatal hernia is again seen.  The lungs are well-aerated bilaterally without focal infiltrate or sizable effusion.  No bony abnormality is seen.  IMPRESSION: Large hiatal hernia is stable.  No acute abnormality is noted.   Original Report Authenticated By: Alcide Clever, M.D.     ASSESSMENT  AND PLAN: Present on Admission:  . SOB (shortness of breath) - Exclusively on exertion  - However there are no clinical signs of fluid overload, a d-dimer was drawn here in the emergency room which is significantly elevated, ED M.D. has ordered a VQ scan which is currently in progress. She does not appear hypoxic or tachycardia my exam. We will follow those results. A 2-D echocardiogram will be ordered. She is also complaining of pain in the popliteal fossa area bilaterally, lower extremity Dopplers will be ordered.  - We will monitor her clinically, hopefully with correction of potassium, improvement in her sodium she will slowly feeling better.   . Weakness generalized - Multifactorial - probably a combination of hypokalemia, hyponatremia and other metabolic derangements with some element of debility.  - Will obtain PT eval   . Hyponatremia  - Probably secondary to dehydration, improving with hydration - Recheck electrolytes in the morning, his sodium has not risen appropriately or is still persistently low would then consider further workup.   [name] Hypokalemia - Given a long-standing history of diarrhea, hypokalemia is presumed to be from GI loss - Is being repleted in the emergency room with for IV KCL runs and 80 mEq oral dose - Recheck electrolytes in a.m.  [name] Crohn's disease - She denies any abdominal pain, she does have 4-5 bowel movements at baseline. Her Crohn's disease-at least in the abdomen seems to be stable.  Claims to have not taken Remicade for the past 3 months-because she had recent pneumonia - She is however complaining of polyarthralgias-wonder if she has a half it is secondary to inflammation bowel disease  - Check ESR   . Anemia - Likely secondary to chronic disease  - Monitor for now   . Polyarthralgias - She does have one small area of erythema and exquisite tenderness in the palmar aspect of her left lateral wrist joint, she also claims to have pain in posterior  aspect of the knees, bilateral hip pain-? 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 GERD (gastroesophageal reflux disease) - PPI   . Acute renal failure - Mild renal insufficiency, hydrate with IV fluids and recheck electrolytes in a.m.  Further plan will  depend as patient's clinical course evolves and further radiologic and laboratory data become available. Patient will be monitored closely.  DVT Prophylaxis: - SQ heparin  Code Status: - Full code  Total time spent for admission equals 45 minutes.  [facility] Pager [phone]  If 7PM-7AM, please contact night-coverage [email or web address] Password [facility] 02/24/2013, 4:55 PM

## 2013-02-24 NOTE — ED Notes (Signed)
Plunkett, MD notified of abnormal lab test results 

## 2013-02-24 NOTE — ED Notes (Signed)
Pt returned from NM 

## 2013-02-24 NOTE — ED Notes (Signed)
Pt reports having pneumonia about 2 months ago

## 2013-02-24 NOTE — ED Notes (Signed)
Dr.Plunkett aware of critical values

## 2013-02-24 NOTE — ED Notes (Signed)
Pt to nuclear medicine.

## 2013-02-24 NOTE — ED Provider Notes (Signed)
History     CSN: [id number]  Arrival date & time 02/24/13  4098   First MD Initiated Contact with Patient 02/24/13 1118      Chief Complaint  Patient presents with  . Shortness of Breath    (Consider location/radiation/quality/duration/timing/severity/associated sxs/prior treatment) HPI Comments: Patient is a 67 year old female who presents for gradually worsening shortness of breath x1 week. Patient states that her shortness of breath is worse with exertion and only mildly relieved by rest. Patient denies any symptoms associated with her shortness of breath including recent fever, vision changes, cough, chest pain, nausea or vomiting, abdominal pain, or numbness or tingling in her upper and lower extremities. Patient further denies loss of consciousness. She also has an associated complaint of left wrist pain and swelling around the radial aspect of her wrist that she says started around the same time. Patient admits to a sharp pain sensation that is nonradiating. She admits to associated erythema and warmth. Patient has a history of polyarthralgia and joint pain for which she takes hydrocodone. She states that this has provided her no relief of her pain.  Patient is a 67 y.o. female presenting with shortness of breath. The history is provided by the patient. No language interpreter was used.  Shortness of Breath Associated symptoms: no abdominal pain, no chest pain, no fever, no neck pain and no vomiting     Past Medical History  Diagnosis Date  . Crohn's disease   . Chronic diarrhea   . Joint pain   . GERD (gastroesophageal reflux disease)   . Polyarthralgia   . Shortness of breath   . Pneumonia   . Anemia   . Hernia (acquired) (recurrent)     hiatal  . Hiatal hernia   . Back pain   . Hemorrhoids     Past Surgical History  Procedure Laterality Date  . Bowel resection    . Colostomy      [name]  . Anal stenosis    . Colonoscopy  2000    needs to have one  .  Cholecystectomy    . Appendectomy    . Goiter  1999  . Esophagogastroduodenoscopy  11/11/2011    Procedure: ESOPHAGOGASTRODUODENOSCOPY (EGD);  Surgeon: Barrie Folk, MD;  Location: [facility] ENDOSCOPY;  Service: Endoscopy;  Laterality: N/A;  . Flexible sigmoidoscopy  11/11/2011    Procedure: FLEXIBLE SIGMOIDOSCOPY;  Surgeon: Barrie Folk, MD;  Location: [facility] ENDOSCOPY;  Service: Endoscopy;  Laterality: N/A;  . Esophagogastroduodenoscopy  05/18/2012    Procedure: ESOPHAGOGASTRODUODENOSCOPY (EGD);  Surgeon: Malissa Hippo, MD;  Location: [facility] ENDO SUITE;  Service: Endoscopy;  Laterality: N/A;  200    Family History  Problem Relation Age of Onset  . Diabetes Mother   . Healthy Sister   . Hypertension Brother   . Colon cancer Brother     died with colon cancer    History  Substance Use Topics  . Smoking status: Former Smoker -- 0.25 packs/day for 4 years    Types: Cigarettes    Quit date: 10/05/1996  . Smokeless tobacco: Never Used     Comment: 1 pack every two weeks when she smoked  . Alcohol Use: No     Comment: denies    OB History   Grav Para Term Preterm Abortions TAB SAB Ect Mult Living                  Review of Systems  Constitutional: Positive for fatigue. Negative for fever and chills.  HENT: Negative for neck pain, neck stiffness and tinnitus.   Eyes: Negative for visual disturbance.  Respiratory: Positive for shortness of breath.   Cardiovascular: Negative for chest pain.  Gastrointestinal: Negative for nausea, vomiting, abdominal pain and diarrhea.  Genitourinary: Negative.   Neurological: Negative for syncope, weakness and numbness.  All other systems reviewed and are negative.    Allergies  Aspirin and Sulfa antibiotics  Home Medications   Current Outpatient Rx  Name  Route  Sig  Dispense  Refill  . bifidobacterium infantis (ALIGN) capsule   Oral   Take 1 capsule by mouth daily.   30 capsule   5   . Calcium-Vitamin D 600-200 MG-UNIT per tablet   Oral    Take 1 tablet by mouth daily. Do not resume this medication until it has been cleared with your Gastroenterologist.   30 tablet   3   . Cyanocobalamin (VITAMIN B-12 IJ)   Injection   Inject 1 each as directed every 30 (thirty) days.         . diphenoxylate-atropine (LOMOTIL) 2.5-0.025 MG per tablet      TAKE 1 TABLET 3 TIMES A DAY   90 tablet   2   . HYDROcodone-acetaminophen (NORCO/VICODIN) 5-325 MG per tablet      TAKE 1 TABLET BY MOUTH 2 TIMES DAILY AS NEEDED FOR PAIN   60 tablet   0   . loperamide (IMODIUM) 2 MG capsule   Oral   Take 1 capsule (2 mg total) by mouth 3 (three) times daily as needed for diarrhea or loose stools.   30 capsule   0   . megestrol (MEGACE ORAL) 40 MG/ML suspension   Oral   Take 5 mLs (200 mg total) by mouth daily.   240 mL   1   . multivitamin (THERAGRAN) per tablet   Oral   Take 1 tablet by mouth daily.   30 tablet   3   . nystatin-triamcinolone (MYCOLOG II) cream   Topical   Apply 1 application topically daily.         . ondansetron (ZOFRAN) 4 MG tablet   Oral   Take 1 tablet (4 mg total) by mouth every 12 (twelve) hours as needed for nausea.   30 tablet   1   . pantoprazole (PROTONIX) 40 MG tablet   Oral   Take 1 tablet (40 mg total) by mouth daily.   30 tablet   5   . potassium chloride SA (K-DUR,KLOR-CON) 20 MEQ tablet   Oral   Take 1 tablet (20 mEq total) by mouth daily.   30 tablet   2   . zolpidem (AMBIEN) 10 MG tablet   Oral   Take 0.5 tablets (5 mg total) by mouth as needed. insomnia   1 tablet   0     BP 90/56  Pulse 99  Resp 20  SpO2 100%  Physical Exam  Nursing note and vitals reviewed. Constitutional: She is oriented to person, place, and time. She appears well-developed and well-nourished. No distress.  HENT:  Head: Normocephalic and atraumatic.  Right Ear: External ear normal.  Left Ear: External ear normal.  Mouth/Throat: Oropharynx is clear and moist. No oropharyngeal exudate.  Eyes:  Conjunctivae are normal. Pupils are equal, round, and reactive to light. No scleral icterus.  Neck: Normal range of motion. Neck supple.  Cardiovascular: Regular rhythm, normal heart sounds and intact distal pulses.   Tachycardic  Pulmonary/Chest: Breath sounds normal. No respiratory distress.  She has no wheezes. She has no rales.  Patient appears visibly SOB, taking full and deep breaths  Abdominal: Soft. Bowel sounds are normal. She exhibits no distension. There is no tenderness. There is no rebound and no guarding.  Musculoskeletal: Normal range of motion. She exhibits no edema.  Lymphadenopathy:    She has no cervical adenopathy.  Neurological: She is alert and oriented to person, place, and time.  Skin: Skin is warm and dry. She is not diaphoretic. No erythema.  Psychiatric: She has a normal mood and affect. Her behavior is normal.    ED Course  Procedures (including critical care time)  Labs Reviewed  CBC WITH DIFFERENTIAL - Abnormal; Notable for the following:    WBC 12.0 (*)    Hemoglobin 11.4 (*)    HCT 31.3 (*)    MCV 76.9 (*)    MCHC 36.4 (*)    RDW 16.1 (*)    Neutro Abs 8.9 (*)    All other components within normal limits  BASIC METABOLIC PANEL - Abnormal; Notable for the following:    Sodium 128 (*)    Potassium <2.0 (*)    CO2 8 (*)    Creatinine, Ser 1.45 (*)    Calcium 8.3 (*)    GFR calc non Af Amer 37 (*)    GFR calc Af Amer 43 (*)    All other components within normal limits  D-DIMER, QUANTITATIVE - Abnormal; Notable for the following:    D-Dimer, Quant 2.38 (*)    All other components within normal limits  POCT I-STAT, CHEM 8 - Abnormal; Notable for the following:    Sodium 134 (*)    Potassium 2.1 (*)    Creatinine, Ser 1.50 (*)    Glucose, Bld 122 (*)    Calcium, Ion 1.11 (*)    Hemoglobin 11.9 (*)    HCT 35.0 (*)    All other components within normal limits  TROPONIN I   Dg Chest 2 View  02/24/2013  *RADIOLOGY REPORT*  Clinical Data:  Shortness of breath  CHEST - 2 VIEW  Comparison: 01/14/2013  Findings: Cardiac shadow is stable.  A large hiatal hernia is again seen.  The lungs are well-aerated bilaterally without focal infiltrate or sizable effusion.  No bony abnormality is seen.  IMPRESSION: Large hiatal hernia is stable.  No acute abnormality is noted.   Original Report Authenticated By: Alcide Clever, M.D.     Date: 02/24/2013  Rate: 101  Rhythm: sinus tachycardia  QRS Axis: normal  Intervals: normal  ST/T Wave abnormalities: nonspecific ST changes  Conduction Disutrbances:secondary repolarization abnormalities  Narrative Interpretation: sinus tachycardia with early precordial r/s transition; LVH with secondary repolarization abnormality. No STEMI or T wave inversion.  Old EKG Reviewed: unchanged from 12/15/2012   1. Hypokalemia     MDM  Patient presents for a gradually worsening dyspnea on exertion with mild shortness of breath at rest. Patient's chest is negative for signs of bronchitis or pneumonia. Patient admits to improvement in symptoms with nebulizer tx. Lab work significant for a leukocytosis to 12 as well as a d-dimer of 2.3. VQ scan ordered to determine whether patient has a pulmonary embolism in light of elevated Cr level. Patient's potassium level found to be less than 2; potassium level reordered for further evaluation as patient is well appearing and symptomatic with no cardiac symptoms all of which are not consistent with a potassium level this low. EKG ordered.  EKG unchanged from 2 months ago.  Hypokalemia confirmed with potassium level of 2.1. IV potassium ordered as well as fluids. Hospitalists have been consulted and will admit the patient for hypokalemia in addition to the patient's shortness of breath. VQ scan pending. Patient, at this time, well and nontoxic appearing and satting 100% on RA.       Antony Madura, PA-C 02/25/13 1058

## 2013-02-25 ENCOUNTER — Inpatient Hospital Stay (HOSPITAL_COMMUNITY): Payer: Medicare Other

## 2013-02-25 DIAGNOSIS — E872 Acidosis: Secondary | ICD-10-CM

## 2013-02-25 DIAGNOSIS — E860 Dehydration: Secondary | ICD-10-CM

## 2013-02-25 DIAGNOSIS — M79609 Pain in unspecified limb: Secondary | ICD-10-CM | POA: Diagnosis not present

## 2013-02-25 LAB — HEPATIC FUNCTION PANEL
ALT: 22 U/L (ref 0–35)
AST: 24 U/L (ref 0–37)
Albumin: 3 g/dL — ABNORMAL LOW (ref 3.5–5.2)
Alkaline Phosphatase: 78 U/L (ref 39–117)
Total Bilirubin: 0.2 mg/dL — ABNORMAL LOW (ref 0.3–1.2)
Total Protein: 7.8 g/dL (ref 6.0–8.3)

## 2013-02-25 LAB — SEDIMENTATION RATE: Sed Rate: 80 mm/hr — ABNORMAL HIGH (ref 0–22)

## 2013-02-25 LAB — COMPREHENSIVE METABOLIC PANEL
ALT: 19 U/L (ref 0–35)
CO2: 8 mEq/L — CL (ref 19–32)
Calcium: 8.3 mg/dL — ABNORMAL LOW (ref 8.4–10.5)
Creatinine, Ser: 1.31 mg/dL — ABNORMAL HIGH (ref 0.50–1.10)
GFR calc Af Amer: 48 mL/min — ABNORMAL LOW (ref >90)
GFR calc non Af Amer: 42 mL/min — ABNORMAL LOW (ref >90)
Glucose, Bld: 110 mg/dL — ABNORMAL HIGH (ref 70–99)

## 2013-02-25 LAB — CBC
Hemoglobin: 10.6 g/dL — ABNORMAL LOW (ref 12.0–15.0)
MCHC: 35.7 g/dL (ref 30.0–36.0)
RBC: 3.76 MIL/uL — ABNORMAL LOW (ref 3.87–5.11)

## 2013-02-25 LAB — BASIC METABOLIC PANEL
CO2: 11 mEq/L — ABNORMAL LOW (ref 19–32)
Calcium: 8.5 mg/dL (ref 8.4–10.5)
Chloride: 106 mEq/L (ref 96–112)
Glucose, Bld: 175 mg/dL — ABNORMAL HIGH (ref 70–99)
Sodium: 134 mEq/L — ABNORMAL LOW (ref 135–145)

## 2013-02-25 LAB — MAGNESIUM: Magnesium: 1.3 mg/dL — ABNORMAL LOW (ref 1.5–2.5)

## 2013-02-25 MED ORDER — POTASSIUM CHLORIDE CRYS ER 20 MEQ PO TBCR
40.0000 meq | EXTENDED_RELEASE_TABLET | ORAL | Status: AC
  Administered 2013-02-25 (×3): 40 meq via ORAL
  Filled 2013-02-25 (×3): qty 2

## 2013-02-25 MED ORDER — POTASSIUM CHLORIDE 2 MEQ/ML IV SOLN: INTRAVENOUS | Status: DC

## 2013-02-25 MED ORDER — MAGNESIUM SULFATE 40 MG/ML IJ SOLN
2.0000 g | Freq: Once | INTRAMUSCULAR | Status: AC
  Administered 2013-02-25: 2 g via INTRAVENOUS
  Filled 2013-02-25: qty 50

## 2013-02-25 MED ORDER — POTASSIUM CHLORIDE CRYS ER 20 MEQ PO TBCR
40.0000 meq | EXTENDED_RELEASE_TABLET | Freq: Once | ORAL | Status: AC
  Administered 2013-02-25: 40 meq via ORAL
  Filled 2013-02-25: qty 2

## 2013-02-25 MED ORDER — SODIUM BICARBONATE 8.4 % IV SOLN: INTRAVENOUS | Status: DC

## 2013-02-25 MED ORDER — POTASSIUM CHLORIDE 2 MEQ/ML IV SOLN
INTRAVENOUS | Status: DC
  Administered 2013-02-25 – 2013-02-26 (×3): via INTRAVENOUS
  Filled 2013-02-25 (×10): qty 850

## 2013-02-25 NOTE — Progress Notes (Signed)
CRITICAL VALUE ALERT  Critical value received:  K 2.5 & CO2 8  Date of notification:  02/25/13  Time of notification:  0700  Critical value read back:yes  Nurse who received alert:  Carlyle Lipa, RN  MD notified (1st page):  Orlena Sheldon MD  Time of first page:  0702  MD notified (2nd page):N. Dhungel   Time of second page:0719  Responding MD:  Dorris Carnes Dhungel  Time MD responded:  [phone]

## 2013-02-25 NOTE — ED Provider Notes (Signed)
Medical screening examination/treatment/procedure(s) were conducted as a shared visit with non-physician practitioner(s) and myself.  I personally evaluated the patient during the encounter Patient complaining of exertional shortness of breath and generalized weakness over the last week. Her lung sounds are clear on exam and she has no abdominal tenderness. Labs indicate severe hypokalemia with a potassium of 2.1. Unknown reason for bicarbonate of 8 other than her metabolic derrangement from crohn's and chronic diarrhea.  D-dimer is elevated. Do to increasing creatinine of 1.5 get VQ scan. Will admit for hypokalemia and shortness of breath  Gwyneth Sprout, MD 02/25/13 1203

## 2013-02-25 NOTE — Evaluation (Signed)
Physical Therapy Evaluation Patient Details Name: Danielle [name] MRN: [id number] DOB: 1946-06-21 Today's Date: 02/25/2013 Time: 4098-1191 PT Time Calculation (min): 9 min  PT Assessment / Plan / Recommendation Clinical Impression  Pt s/p SOB with decr mobility secondary to decr endurance for activity.  limited evaluation secondary to pt refused to ambulate.  Appears pt would be safe to go home with sister with HHPT f/u as long as she has 24 hour care.  Pt DOE 2/4 at rest and 3/4 with activity.     PT Assessment  Patient needs continued PT services    Follow Up Recommendations  Home health PT;Supervision/Assistance - 24 hour                Equipment Recommendations  Other (comment) (TBA)         Frequency Min 3X/week    Precautions / Restrictions Precautions Precautions: Fall Restrictions Weight Bearing Restrictions: No   Pertinent Vitals/Pain VSS, No pain     Mobility  Bed Mobility Bed Mobility: Rolling Right;Right Sidelying to Sit Rolling Right: 7: Independent Right Sidelying to Sit: 7: Independent Transfers Transfers: Sit to Stand;Stand to Sit Sit to Stand: 5: Supervision;With upper extremity assist;From bed Stand to Sit: 5: Supervision;With upper extremity assist;To bed Details for Transfer Assistance: Refused to do more than stand.  Refused to ambulate. Appears steady at EOB.  Has been getting to and from 3N1 all day by herself.  Ambulation/Gait Ambulation/Gait Assistance: Not tested (comment) Stairs: No Wheelchair Mobility Wheelchair Mobility: No         PT Diagnosis: Generalized weakness  PT Problem List: Decreased activity tolerance;Decreased mobility PT Treatment Interventions: DME instruction;Gait training;Therapeutic activities;Functional mobility training;Therapeutic exercise;Patient/family education   PT Goals Acute Rehab PT Goals PT Goal Formulation: With patient Time For Goal Achievement: 03/04/13 Potential to Achieve Goals: Good Pt will go  Sit to Stand: Independently PT Goal: Sit to Stand - Progress: Goal set today Pt will Ambulate: >150 feet;with modified independence;with least restrictive assistive device PT Goal: Ambulate - Progress: Goal set today Pt will Perform Home Exercise Program: Independently PT Goal: Perform Home Exercise Program - Progress: Goal set today  Visit Information  Last PT Received On: 02/25/13 Assistance Needed: +1    Subjective Data  Subjective: "I can go home." Patient Stated Goal: to go home   Prior Functioning  Home Living Lives With: Family;Other (Comment) (sister) Available Help at Discharge: Family Type of Home: House Home Access: Level entry Home Layout: One level Firefighter: Standard Home Adaptive Equipment: None Prior Function Level of Independence: Independent Able to Take Stairs?: Yes Driving: No Vocation: Retired Musician: No difficulties    Copywriter, advertising Overall Cognitive Status: Appears within functional limits for tasks assessed/performed Arousal/Alertness: Awake/alert Orientation Level: Appears intact for tasks assessed Behavior During Session: [facility] for tasks performed    Extremity/Trunk Assessment Right Lower Extremity Assessment RLE ROM/Strength/Tone: [facility] for tasks assessed Left Lower Extremity Assessment LLE ROM/Strength/Tone: [facility] for tasks assessed   Balance Static Standing Balance Static Standing - Balance Support: No upper extremity supported;During functional activity Static Standing - Level of Assistance: 5: Stand by assistance Static Standing - Comment/# of Minutes: 2 minutes EOB with no gross abnormalities in balance.    End of Session PT - End of Session Equipment Utilized During Treatment: Oxygen Activity Tolerance: Patient limited by fatigue Patient left: in bed;with call bell/phone within reach Nurse Communication: Mobility status       INGOLD,Daran Favaro 02/25/2013, 4:30 PM  Alaisha Eversley Ingold,PT Acute  Rehabilitation [phone] (pager)

## 2013-02-25 NOTE — Progress Notes (Addendum)
[facility] PROGRESS NOTE  RAJAH LAMBA ZOX:[id number] DOB: 1946/07/15 DOA: 02/24/2013 PCP: [name], MD   Brief narrative 67 year old female with history of Crohn's disease previously on Remicade, polyarthralgia with chronic diarrhea presented with one week history of exertional shortness of breath. Patient found to be hypokalemic with anion gap metabolic acidosis.   Assessment/Plan: Shortness of breath Appears like in the setting of dehydration. VQ scan low probability for PE. A lower extremity Doppler has been ordered for symptoms of bilateral leg pain which seems to be better today. ProBNP mildly elevated. Symptoms are better after hydration. -I will check a 2-D echo if symptoms persist. -Monitor O2 sats.  Anion gap metabolic acidosis Likely in the setting of dehydration with chronic diarrhea. Her bicarbonate on presentation was 8. -Started her on D5 with 3 amps of bicarbonate at 150 cc per hour. Monitor repeat chemistry in the evening. -Discussed plan with renal consult Dr. Arlean Hopping over the phone recommends the current with hydration and followup on labs. We'll officially reconsult if needed.  Severe hypokalemia Again in the setting of dehydration and GI loss. Given potassium supplements on admission with minimal improvement. Ordered for 40 mEq KCl for 3 doses. Mg of 1.3 which is being repleted as well magnesium. Her leg pains could be related cramping with hyperkalemia and seems to have improved. Recheck lites in the evening. -Telemetry monitoring.  Polyarthralgia Patient complains of pain over posterior aspect of the knees, and bilateral hip pain. For hip x-rays which will follow. ESR elevated. Possibly  reactive arthritis from her underlying Crohn's.   Crohn's disease  Follows with Dr. Karilyn Cota. She has underlying chronic diarrhea with 4-5 bowel movements every day. Was on Remicade  until 3 months back and was stopped as she developed pneumonia.started on prednisone  in the ED which will continue.    anemia Likely of chronic disease. Monitor for now   GERD Continue PPI  Acute kidney injury Possibly in the setting of dehydration. Slightly improved in a.m. labs. Continue to monitor  DVT prophylaxis Subcutaneous heparin        Code Status:  full code Family Communication:  none at bedside  Disposition Plan:  home once stable   Consultants:   none  Procedures:   none  Antibiotics:   none   HPI/Subjective:  patient feels her breathing to be better. Still has lower bowel movements. Denies any abdominal pain  Objective: Filed Vitals:   02/24/13 2108 02/24/13 2119 02/25/13 0505 02/25/13 0555  BP: 99/60  95/56   Pulse: 97 96 94 92  Temp: 97.5 F (36.4 C)  97.3 F (36.3 C)   TempSrc: Oral  Oral   Resp: 20 18 18 18   Weight:   54.658 kg (120 lb 8 oz)   SpO2: 100%  100%     Intake/Output Summary (Last 24 hours) at 02/25/13 0954 Last data filed at 02/24/13 2110  Gross per 24 hour  Intake      0 ml  Output      1 ml  Net     -1 ml   Filed Weights   02/25/13 0505  Weight: 54.658 kg (120 lb 8 oz)    Exam:   General:   elderly female lying in bed in no acute distress  HEENT: No pallor, dry oral mucosa  Cardiovascular:  normal S1 and S2, no murmurs rub or gallop  Respiratory: clear to auscultation bilaterally, no added sounds  Abdomen:  soft, nontender, nondistended, bowel sounds present  Musculoskeletal:  warm, no edema, some pain over bilateral hip on pressure and left wrist.  CNS: AAO x3  Data Reviewed: Basic Metabolic Panel:  Recent Labs Lab 02/24/13 1250 02/24/13 1558 02/25/13 0600 02/25/13 0800  NA 128* 134* 135  --   K <2.0* 2.1* 2.5*  --   CL 101 110 111  --   CO2 8*  --  8*  --   GLUCOSE 91 122* 110*  --   BUN 20 19 19   --   CREATININE 1.45* 1.50* 1.31*  --   CALCIUM 8.3*  --  8.3*  --   MG  --   --   --  1.3*   Liver Function Tests:  Recent Labs Lab 02/25/13 0600  AST 21  ALT 19   ALKPHOS 79  BILITOT 0.3  PROT 7.9  ALBUMIN 3.0*   No results found for this basename: LIPASE, AMYLASE,  in the last 168 hours No results found for this basename: AMMONIA,  in the last 168 hours CBC:  Recent Labs Lab 02/24/13 1216 02/24/13 1558 02/25/13 0600  WBC 12.0*  --  7.7  NEUTROABS 8.9*  --   --   HGB 11.4* 11.9* 10.6*  HCT 31.3* 35.0* 29.7*  MCV 76.9*  --  79.0  PLT 380  --  408*   Cardiac Enzymes:  Recent Labs Lab 02/24/13 1315  TROPONINI <0.30   BNP (last 3 results)  Recent Labs  02/24/13 1625  PROBNP 511.1*   CBG:  Recent Labs Lab 02/24/13 2138 02/25/13 0616  GLUCAP 129* 105*    No results found for this or any previous visit (from the past 240 hour(s)).   Studies: Dg Chest 2 View  02/24/2013  *RADIOLOGY REPORT*  Clinical Data: Shortness of breath  CHEST - 2 VIEW  Comparison: 01/14/2013  Findings: Cardiac shadow is stable.  A large hiatal hernia is again seen.  The lungs are well-aerated bilaterally without focal infiltrate or sizable effusion.  No bony abnormality is seen.  IMPRESSION: Large hiatal hernia is stable.  No acute abnormality is noted.   Original Report Authenticated By: Alcide Clever, M.D.    Nm Pulmonary Perf And Vent  02/24/2013  *RADIOLOGY REPORT*  Clinical Data:  Elevated D-dimer, short of breath for 1 week  NUCLEAR MEDICINE VENTILATION - PERFUSION LUNG SCAN  Technique:  Ventilation images were obtained in multiple projections using inhaled aerosol technetium 99 M DTPA.  Perfusion images were obtained in multiple projections after intravenous injection of Tc-73m MAA.  Radiopharmaceuticals:  Tc-84m DTPA aerosol and six mCi Tc-70m MAA.  Comparison: Chest radiograph 02/24/2013  Findings:  Ventilation:   No focal ventilation defect.  Perfusion:   No wedge shaped peripheral perfusion defects to suggest acute pulmonary embolism  IMPRESSION: Very low probability for acute pulmonary embolism.   Original Report Authenticated By: Genevive Bi, M.D.     Scheduled Meds: . 0.9 % NaCl with KCl 20 mEq / L   Intravenous Once  . acidophilus  1 capsule Oral Daily  . albuterol  2.5 mg Nebulization Q8H  . calcium-vitamin D  1 tablet Oral Daily  . heparin  5,000 Units Subcutaneous Q8H  . magnesium sulfate 1 - 4 g bolus IVPB  2 g Intravenous Once  . megestrol  200 mg Oral Daily  . multivitamin with minerals  1 tablet Oral Daily  . pantoprazole  40 mg Oral Daily  . potassium chloride  40 mEq Oral Q4H  . predniSONE  40 mg Oral  Q breakfast  . sodium chloride  3 mL Intravenous Q12H   Continuous Infusions: . dextrose 5 % 850 mL with potassium chloride 40 mEq, sodium bicarbonate 150 mEq infusion 125 mL/hr at 02/25/13 0926      Time spent: 35 minutes    Charlesetta Milliron  [facility] Pager [phone] If 7PM-7AM, please contact night-coverage at [email or web address], password [email or web address] 02/25/2013, 9:54 AM  LOS: 1 day

## 2013-02-25 NOTE — Progress Notes (Signed)
VASCULAR LAB PRELIMINARY  PRELIMINARY  PRELIMINARY  PRELIMINARY  Bilateral lower extremity venous duplex  completed.    Preliminary report:  Bilateral:  No evidence of DVT, superficial thrombosis, or Baker's Cyst.    Jaleya Pebley, RVT 02/25/2013, 11:49 AM

## 2013-02-26 DIAGNOSIS — R197 Diarrhea, unspecified: Secondary | ICD-10-CM

## 2013-02-26 LAB — BASIC METABOLIC PANEL
BUN: 16 mg/dL (ref 6–23)
CO2: 15 mEq/L — ABNORMAL LOW (ref 19–32)
Chloride: 107 mEq/L (ref 96–112)
Creatinine, Ser: 1.15 mg/dL — ABNORMAL HIGH (ref 0.50–1.10)
GFR calc Af Amer: 57 mL/min — ABNORMAL LOW (ref >90)
Glucose, Bld: 124 mg/dL — ABNORMAL HIGH (ref 70–99)
Potassium: 2.9 mEq/L — ABNORMAL LOW (ref 3.5–5.1)

## 2013-02-26 LAB — GLUCOSE, CAPILLARY
Glucose-Capillary: 113 mg/dL — ABNORMAL HIGH (ref 70–99)
Glucose-Capillary: 108 mg/dL — ABNORMAL HIGH (ref 70–99)
Glucose-Capillary: 151 mg/dL — ABNORMAL HIGH (ref 70–99)
Glucose-Capillary: 102 mg/dL — ABNORMAL HIGH (ref 70–99)

## 2013-02-26 MED ORDER — POTASSIUM CHLORIDE CRYS ER 20 MEQ PO TBCR
40.0000 meq | EXTENDED_RELEASE_TABLET | ORAL | Status: AC
  Administered 2013-02-26 (×2): 40 meq via ORAL
  Filled 2013-02-26 (×2): qty 2

## 2013-02-26 MED ORDER — MAGNESIUM SULFATE 40 MG/ML IJ SOLN
2.0000 g | Freq: Once | INTRAMUSCULAR | Status: AC
  Administered 2013-02-26: 2 g via INTRAVENOUS
  Filled 2013-02-26: qty 50

## 2013-02-26 NOTE — Progress Notes (Addendum)
[facility] PROGRESS NOTE  AZALYNN MAXIM JXB:[id number] DOB: [birth year] DOA: 02/24/2013 PCP: [name], MD  Brief narrative  67 year old female with history of Crohn's disease previously on Remicade, polyarthralgia with chronic diarrhea presented with one week history of exertional shortness of breath. Patient found to be hypokalemic with anion gap metabolic acidosis.  Assessment/Plan:  Shortness of breath  Appears like in the setting of dehydration. VQ scan low probability for PE. A lower extremity Doppler done for b/l leg pain negative for DVT. [name] ProBNP mildly elevated. Symptoms much better after hydration.   Anion gap metabolic acidosis  Likely in the setting of dehydration with chronic diarrhea. Her bicarbonate on presentation was 8.  -Started her on D5 with 3 amps of bicarbonate at 150 cc per hour. Bicarb slowly improving. -Discussed plan with renal consult Dr. Arlean Hopping over the phone recommends the current with hydration and followup on labs.   Severe hypokalemia  Again in the setting of dehydration and GI loss. Given potassium supplements on admission with minimal improvement. Given 40 mEq KCl for 3 doses yesterday. Mg of 1.3 which was repleted as well magnesium.  Stable on tele. Leg crampings are related to hypokalemia and now resolved. k of 2.9 today and further being replenished. Mg of 1.5 and repleting as well.  Polyarthralgia  Patient complains of pain over posterior aspect of the knees, and bilateral hip pain.  hip x-rays unremarkable. ESR elevated. Possibly reactive arthritis from her underlying Crohn's.   Crohn's disease  Follows with Dr. Karilyn Cota. She has underlying chronic diarrhea with 4-5 bowel movements every day. Was on Remicade until 3 months back and was stopped as she developed pneumonia.started on prednisone in the ED for arthritis  which will continue.   anemia  Likely of chronic disease. Monitor   GERD  Continue PPI   Acute kidney injury  Possibly  in the setting of dehydration.  improved in a.m. labs. Continue to monitor   DVT prophylaxis  Subcutaneous heparin   Code Status: full code  Family Communication: none at bedside   Disposition Plan: home tomorrow if lytes stable. Has follow up with her gastroenterologist at 11 am tomorrow.  Consultants:  none Procedures:  none Antibiotics:  none    HPI/Subjective:  patient feels much better today. Diarrhea improved . Denies SOB and no leg cramping further.   Objective: Filed Vitals:   02/25/13 2202 02/26/13 0401 02/26/13 0647 02/26/13 0657  BP:  90/49    Pulse:  109    Temp:  98.8 F (37.1 C)    TempSrc:  Oral    Resp:  18    Weight:    57.516 kg (126 lb 12.8 oz)  SpO2: 100% 99% 100%     Intake/Output Summary (Last 24 hours) at 02/26/13 1201 Last data filed at 02/26/13 0300  Gross per 24 hour  Intake      0 ml  Output      2 ml  Net     -2 ml   Filed Weights   02/25/13 0505 02/26/13 0657  Weight: 54.658 kg (120 lb 8 oz) 57.516 kg (126 lb 12.8 oz)    Exam:  General: elderly female lying in bed in no acute distress  HEENT: No pallor, moist  oral mucosa  Cardiovascular: normal S1 and S2, no murmurs rub or gallop  Respiratory: clear to auscultation bilaterally, no added sounds  Abdomen: soft, nontender, nondistended, bowel sounds present  Musculoskeletal: warm, no edema,  CNS: AAO x3  Data Reviewed: Basic Metabolic Panel:  Recent Labs Lab 02/24/13 1250 02/24/13 1558 02/25/13 0600 02/25/13 0800 02/25/13 1722 02/26/13 0515  NA 128* 134* 135  --  134* 135  K <2.0* 2.1* 2.5*  --  2.9* 2.9*  CL 101 110 111  --  106 107  CO2 8*  --  8*  --  11* 15*  GLUCOSE 91 122* 110*  --  175* 124*  BUN 20 19 19   --  16 16  CREATININE 1.45* 1.50* 1.31*  --  1.24* 1.15*  CALCIUM 8.3*  --  8.3*  --  8.5 7.7*  MG  --   --   --  1.3*  --  1.5   Liver Function Tests:  Recent Labs Lab 02/25/13 0600 02/25/13 1000  AST 21 24  ALT 19 22  ALKPHOS 79 78  BILITOT  0.3 0.2*  PROT 7.9 7.8  ALBUMIN 3.0* 3.0*   No results found for this basename: LIPASE, AMYLASE,  in the last 168 hours No results found for this basename: AMMONIA,  in the last 168 hours CBC:  Recent Labs Lab 02/24/13 1216 02/24/13 1558 02/25/13 0600  WBC 12.0*  --  7.7  NEUTROABS 8.9*  --   --   HGB 11.4* 11.9* 10.6*  HCT 31.3* 35.0* 29.7*  MCV 76.9*  --  79.0  PLT 380  --  408*   Cardiac Enzymes:  Recent Labs Lab 02/24/13 1315  TROPONINI <0.30   BNP (last 3 results)  Recent Labs  02/24/13 1625  PROBNP 511.1*   CBG:  Recent Labs Lab 02/25/13 1154 02/25/13 1607 02/25/13 2136 02/26/13 0613 02/26/13 1116  GLUCAP 120* 134* 116* 113* 108*    No results found for this or any previous visit (from the past 240 hour(s)).   Studies: Dg Hip Bilateral W/pelvis  02/26/2013  *RADIOLOGY REPORT*  Clinical Data: Bilateral hip pain.  BILATERAL HIP WITH PELVIS - 4+ VIEW  Comparison: 03/25/2012 and prior chest radiographs  Findings: There is no evidence of fracture, subluxation or dislocation. Mild joint space narrowing bilaterally identified. There is no evidence of focal bony lesions present. No other significant abnormalities are identified.  IMPRESSION: No evidence of acute abnormality.  Mild joint space narrowing bilaterally.   Original Report Authenticated By: Harmon Pier, M.D.    Nm Pulmonary Perf And Vent  02/24/2013  *RADIOLOGY REPORT*  Clinical Data:  Elevated D-dimer, short of breath for 1 week  NUCLEAR MEDICINE VENTILATION - PERFUSION LUNG SCAN  Technique:  Ventilation images were obtained in multiple projections using inhaled aerosol technetium 99 M DTPA.  Perfusion images were obtained in multiple projections after intravenous injection of Tc-50m MAA.  Radiopharmaceuticals:  Tc-38m DTPA aerosol and six mCi Tc-19m MAA.  Comparison: Chest radiograph 02/24/2013  Findings:  Ventilation:   No focal ventilation defect.  Perfusion:   No wedge shaped peripheral perfusion  defects to suggest acute pulmonary embolism  IMPRESSION: Very low probability for acute pulmonary embolism.   Original Report Authenticated By: Genevive Bi, M.D.     Scheduled Meds: . 0.9 % NaCl with KCl 20 mEq / L   Intravenous Once  . acidophilus  1 capsule Oral Daily  . albuterol  2.5 mg Nebulization Q8H  . calcium-vitamin D  1 tablet Oral Daily  . heparin  5,000 Units Subcutaneous Q8H  . megestrol  200 mg Oral Daily  . multivitamin with minerals  1 tablet Oral Daily  . pantoprazole  40 mg Oral Daily  .  predniSONE  40 mg Oral Q breakfast  . sodium chloride  3 mL Intravenous Q12H   Continuous Infusions: . dextrose 5 % 850 mL with potassium chloride 40 mEq, sodium bicarbonate 150 mEq infusion 125 mL/hr at 02/26/13 1013      Time spent: 25 minutes    Mikeila Burgen  [facility] Pager [phone] If 7PM-7AM, please contact night-coverage at [email or web address], password [facility] 02/26/2013, 12:01 PM  LOS: 2 days

## 2013-02-27 ENCOUNTER — Ambulatory Visit (INDEPENDENT_AMBULATORY_CARE_PROVIDER_SITE_OTHER): Payer: Medicare Other | Admitting: Internal Medicine

## 2013-02-27 ENCOUNTER — Encounter (HOSPITAL_COMMUNITY): Payer: Self-pay | Admitting: General Practice

## 2013-02-27 LAB — BASIC METABOLIC PANEL
CO2: 24 mEq/L (ref 19–32)
Calcium: 7.4 mg/dL — ABNORMAL LOW (ref 8.4–10.5)
Creatinine, Ser: 0.95 mg/dL (ref 0.50–1.10)
GFR calc non Af Amer: 62 mL/min — ABNORMAL LOW (ref >90)
Sodium: 137 mEq/L (ref 135–145)

## 2013-02-27 LAB — GLUCOSE, CAPILLARY: Glucose-Capillary: 105 mg/dL — ABNORMAL HIGH (ref 70–99)

## 2013-02-27 MED ORDER — ALBUTEROL SULFATE HFA 108 (90 BASE) MCG/ACT IN AERS: 2.0000 | INHALATION_SPRAY | Freq: Four times a day (QID) | RESPIRATORY_TRACT | Status: DC | PRN

## 2013-02-27 MED ORDER — PREDNISONE 20 MG PO TABS: 40.0000 mg | ORAL_TABLET | Freq: Every day | ORAL | Status: DC

## 2013-02-27 MED ORDER — POTASSIUM CHLORIDE CRYS ER 20 MEQ PO TBCR: 20.0000 meq | EXTENDED_RELEASE_TABLET | Freq: Two times a day (BID) | ORAL | Status: DC

## 2013-02-27 MED ORDER — DIPHENOXYLATE-ATROPINE 2.5-0.025 MG PO TABS: 2.0000 | ORAL_TABLET | Freq: Four times a day (QID) | ORAL | Status: DC | PRN

## 2013-02-27 NOTE — Discharge Summary (Signed)
Physician Discharge Summary  Danielle [name] WUJ:[id number] DOB: March 07, 1946 DOA: 02/24/2013  PCP: [name], MD  Admit date: 02/24/2013 Discharge date: 02/27/2013  Time spent: 40 minutes  Recommendations for Outpatient Follow-up:  D/c home. Has follow up with her GI today  Discharge Diagnoses:  Principal Problem:   Dehydration with Metabolic acidosis  Active Problems:   Severe Hypokalemia   Crohn's disease   Acute renal failure   GERD (gastroesophageal reflux disease)   Weakness generalized   Anemia   SOB (shortness of breath)   Hypomagnesemia   Discharge Condition: fair  Diet recommendation: regular  Filed Weights   02/25/13 0505 02/26/13 0657 02/27/13 0508  Weight: 54.658 kg (120 lb 8 oz) 57.516 kg (126 lb 12.8 oz) 58.7 kg (129 lb 6.6 oz)    History of present illness:  67 year old female with history of Crohn's disease previously on Remicade, polyarthralgia with chronic diarrhea presented with one week history of exertional shortness of breath. Patient found to be hypokalemic with anion gap metabolic acidosis.   Hospital Course:  Shortness of breath  Appears like in the setting of dehydration. VQ scan low probability for PE. A lower extremity Doppler done for b/l leg pain negative for DVT. . . Symptoms much better after hydration.   Anion gap metabolic acidosis  Likely in the setting of dehydration with chronic diarrhea. Her bicarbonate on presentation was 8 with an anion gap of 17. -Started her on D5 with 3 amps of bicarbonate at 150 cc per hour. Bicarb now improved. .  Severe hypokalemia  Again in the setting of dehydration and GI loss. Presented with k < 2. No EKG changes. Mg of 1.3 given aggressive kcl supplements and now improved. Will increase kcl dose to 20 meq tid . Mg of 1.3 which was repleted as well magnesium.  Stable on tele. Leg crampings are related to hypokalemia and now resolved. Needs closer follow up of her potassium level as  outpatient continue lomotil for diarrhea  Polyarthralgia  Patient complains of pain over posterior aspect of the knees, and bilateral hip pain. hip x-rays unremarkable. ESR elevated. Possibly reactive arthritis from her underlying Crohn's.  Ordered a short course of po prednisone and symptoms now better.   Crohn's disease  Follows with Dr. Karilyn Cota. She has underlying chronic diarrhea with 4-5 bowel movements every day. Was on Remicade until 3 months back and was stopped as she developed pneumonia.  anemia  Likely of chronic disease. stable  GERD  Continue PPI   Acute kidney injury   in the setting of dehydration. Now resolved.    Code Status: full code   Consultants:  none Procedures:  none Antibiotics:  none   Discharge Exam: Filed Vitals:   02/26/13 1321 02/26/13 2001 02/27/13 0508 02/27/13 0700  BP: 98/61 90/55 88/46  91/59  Pulse: 115 91 69   Temp: 98 F (36.7 C) 98.2 F (36.8 C) 98.7 F (37.1 C)   TempSrc: Oral Oral Oral   Resp: 18 18 18    Weight:   58.7 kg (129 lb 6.6 oz)   SpO2: 100% 99% 99%     General: elderly female lying in bed in no acute distress  HEENT: No pallor, moist oral mucosa  Cardiovascular: normal S1 and S2, no murmurs rub or gallop  Respiratory: clear to auscultation bilaterally, no added sounds  Abdomen: soft, nontender, nondistended, bowel sounds present  Musculoskeletal: warm, no edema,  CNS: AAO x3   Discharge Instructions     Medication List  TAKE these medications       bifidobacterium infantis capsule  Take 1 capsule by mouth daily.     Calcium-Vitamin D 600-200 MG-UNIT per tablet  Take 1 tablet by mouth daily. Do not resume this medication until it has been cleared with your Gastroenterologist.     diphenoxylate-atropine 2.5-0.025 MG per tablet  Commonly known as:  LOMOTIL  TAKE 1 TABLET 3 TIMES A DAY     HYDROcodone-acetaminophen 5-325 MG per tablet  Commonly known as:  NORCO/VICODIN  TAKE 1 TABLET BY MOUTH 2  TIMES DAILY AS NEEDED FOR PAIN     loperamide 2 MG capsule  Commonly known as:  IMODIUM  Take 1 capsule (2 mg total) by mouth 3 (three) times daily as needed for diarrhea or loose stools.     megestrol 40 MG/ML suspension  Commonly known as:  MEGACE ORAL  Take 5 mLs (200 mg total) by mouth daily.     multivitamin per tablet  Take 1 tablet by mouth daily.     nystatin-triamcinolone cream  Commonly known as:  MYCOLOG II  Apply 1 application topically daily.     ondansetron 4 MG tablet  Commonly known as:  ZOFRAN  Take 1 tablet (4 mg total) by mouth every 12 (twelve) hours as needed for nausea.     pantoprazole 40 MG tablet  Commonly known as:  PROTONIX  Take 1 tablet (40 mg total) by mouth daily.     potassium chloride SA 20 MEQ tablet  Commonly known as:  K-DUR,KLOR-CON  Take 1 tablet (20 mEq total) by mouth 3  (three) times daily.     predniSONE 20 MG tablet  Commonly known as:  DELTASONE  Take 2 tablets (40 mg total) by mouth daily with breakfast.     VITAMIN B-12 IJ  Inject 1 each as directed every 30 (thirty) days.     zolpidem 10 MG tablet  Commonly known as:  AMBIEN  Take 0.5 tablets (5 mg total) by mouth as needed. insomnia           Follow-up Information   Follow up with [name], MD.   Contact information:   [address] [place] [address] [phone]       Follow up with Malissa Hippo, MD. (has appointment today)    Contact information:   [address], [address] [place] [address] [phone]        The results of significant diagnostics from this hospitalization (including imaging, microbiology, ancillary and laboratory) are listed below for reference.    Significant Diagnostic Studies: Dg Chest 2 View  02/24/2013  *RADIOLOGY REPORT*  Clinical Data: Shortness of breath  CHEST - 2 VIEW  Comparison: 01/14/2013  Findings: Cardiac shadow is stable.  A large hiatal hernia is again seen.  The lungs are well-aerated  bilaterally without focal infiltrate or sizable effusion.  No bony abnormality is seen.  IMPRESSION: Large hiatal hernia is stable.  No acute abnormality is noted.   Original Report Authenticated By: Alcide Clever, M.D.    Dg Hip Bilateral W/pelvis  02/26/2013  *RADIOLOGY REPORT*  Clinical Data: Bilateral hip pain.  BILATERAL HIP WITH PELVIS - 4+ VIEW  Comparison: 03/25/2012 and prior chest radiographs  Findings: There is no evidence of fracture, subluxation or dislocation. Mild joint space narrowing bilaterally identified. There is no evidence of focal bony lesions present. No other significant abnormalities are identified.  IMPRESSION: No evidence of acute abnormality.  Mild joint  space narrowing bilaterally.   Original Report Authenticated By: Harmon Pier, M.D.    Nm Pulmonary Perf And Vent  02/24/2013  *RADIOLOGY REPORT*  Clinical Data:  Elevated D-dimer, short of breath for 1 week  NUCLEAR MEDICINE VENTILATION - PERFUSION LUNG SCAN  Technique:  Ventilation images were obtained in multiple projections using inhaled aerosol technetium 99 M DTPA.  Perfusion images were obtained in multiple projections after intravenous injection of Tc-54m MAA.  Radiopharmaceuticals:  Tc-60m DTPA aerosol and six mCi Tc-80m MAA.  Comparison: Chest radiograph 02/24/2013  Findings:  Ventilation:   No focal ventilation defect.  Perfusion:   No wedge shaped peripheral perfusion defects to suggest acute pulmonary embolism  IMPRESSION: Very low probability for acute pulmonary embolism.   Original Report Authenticated By: Genevive Bi, M.D.     Microbiology: No results found for this or any previous visit (from the past 240 hour(s)).   Labs: Basic Metabolic Panel:  Recent Labs Lab 02/24/13 1250 02/24/13 1558 02/25/13 0600 02/25/13 0800 02/25/13 1722 02/26/13 0515 02/27/13 1035  NA 128* 134* 135  --  134* 135 137  K <2.0* 2.1* 2.5*  --  2.9* 2.9* 3.6  CL 101 110 111  --  106 107 101  CO2 8*  --  8*  --  11*  15* 24  GLUCOSE 91 122* 110*  --  175* 124* 93  BUN 20 19 19   --  16 16 15   CREATININE 1.45* 1.50* 1.31*  --  1.24* 1.15* 0.95  CALCIUM 8.3*  --  8.3*  --  8.5 7.7* 7.4*  MG  --   --   --  1.3*  --  1.5  --    Liver Function Tests:  Recent Labs Lab 02/25/13 0600 02/25/13 1000  AST 21 24  ALT 19 22  ALKPHOS 79 78  BILITOT 0.3 0.2*  PROT 7.9 7.8  ALBUMIN 3.0* 3.0*   No results found for this basename: LIPASE, AMYLASE,  in the last 168 hours No results found for this basename: AMMONIA,  in the last 168 hours CBC:  Recent Labs Lab 02/24/13 1216 02/24/13 1558 02/25/13 0600  WBC 12.0*  --  7.7  NEUTROABS 8.9*  --   --   HGB 11.4* 11.9* 10.6*  HCT 31.3* 35.0* 29.7*  MCV 76.9*  --  79.0  PLT 380  --  408*   Cardiac Enzymes:  Recent Labs Lab 02/24/13 1315  TROPONINI <0.30   BNP: BNP (last 3 results)  Recent Labs  02/24/13 1625  PROBNP 511.1*   CBG:  Recent Labs Lab 02/26/13 0613 02/26/13 1116 02/26/13 1612 02/26/13 2110 02/27/13 0611  GLUCAP 113* 108* 151* 102* 105*       Signed:  Stephana Morell  [facility] 02/27/2013, 11:54 AM

## 2013-02-27 NOTE — Progress Notes (Signed)
PT Cancellation Note  Patient Details Name: ADIANNA DARWIN MRN: [id number] DOB: December 15, 1945   Cancelled Treatment:    Reason Treat Not Completed: K+ currently 2.9 and no orders for K+ repletion yet on chart. Will attempt to see in pm if K+ given   Geena Weinhold 02/27/2013, 9:49 AM  02/27/2013 Veda Canning, PT Pager: [phone]

## 2013-02-27 NOTE — Progress Notes (Signed)
Discharge instructions given to pt and pt's family along with new prescriptions. Addressed pt's concerns and questions. Pt is stable for discharge with her ride. Will provide wheelchair to escort her to the car.

## 2013-03-01 ENCOUNTER — Encounter (INDEPENDENT_AMBULATORY_CARE_PROVIDER_SITE_OTHER): Payer: Self-pay

## 2013-03-07 ENCOUNTER — Other Ambulatory Visit (INDEPENDENT_AMBULATORY_CARE_PROVIDER_SITE_OTHER): Payer: Self-pay | Admitting: Internal Medicine

## 2013-03-09 DIAGNOSIS — K5090 Crohn's disease, unspecified, without complications: Secondary | ICD-10-CM | POA: Diagnosis not present

## 2013-03-09 DIAGNOSIS — R609 Edema, unspecified: Secondary | ICD-10-CM | POA: Diagnosis not present

## 2013-03-09 DIAGNOSIS — D510 Vitamin B12 deficiency anemia due to intrinsic factor deficiency: Secondary | ICD-10-CM | POA: Diagnosis not present

## 2013-03-21 ENCOUNTER — Ambulatory Visit (INDEPENDENT_AMBULATORY_CARE_PROVIDER_SITE_OTHER): Payer: Medicare Other | Admitting: Internal Medicine

## 2013-03-21 ENCOUNTER — Encounter (INDEPENDENT_AMBULATORY_CARE_PROVIDER_SITE_OTHER): Payer: Self-pay | Admitting: Internal Medicine

## 2013-03-21 VITALS — BP 100/70 | HR 74 | Temp 98.2°F | Resp 18 | Ht 64.0 in | Wt 134.3 lb

## 2013-03-21 DIAGNOSIS — D649 Anemia, unspecified: Secondary | ICD-10-CM | POA: Diagnosis not present

## 2013-03-21 DIAGNOSIS — E876 Hypokalemia: Secondary | ICD-10-CM | POA: Diagnosis not present

## 2013-03-21 DIAGNOSIS — K5090 Crohn's disease, unspecified, without complications: Secondary | ICD-10-CM | POA: Diagnosis not present

## 2013-03-21 NOTE — Progress Notes (Signed)
Presenting complaint;  Followup for Crohn's disease, GERD and hypokalemia.  Subjective:  Patient is 67 year old African female with history of small bowel and perianal Crohn disease who presents for scheduled visit accompanied by her sister. She was on infliximab for maintenance and the last dose was on 10/31/2012. Reman.instructed because of her acute illness and she was admitted to [facility] in January 2014 for pneumonia in last month for severe hypokalemia. She feels better. Her appetite is back to normal. Her sister states her appetite is very good. She has gained about 7 pounds since her last visit of February 2014. She is having 3 bowel movements daily. Stools are usually small in amount and semi-formed. She denies watery or large volume stools. She also denies melena or rectal bleeding. She is using Mycolog 2 cream on when necessary basis. Her new complaint is one of passing gas per vagina. She is noted scant vaginal discharge which is not foul-smelling. This symptom started 1-2 weeks ago. He denies abdominal pain. Heartburn is well controlled with therapy and she does not regurgitate very often.  Current Medications: Current Outpatient Prescriptions  Medication Sig Dispense Refill  . albuterol (PROVENTIL HFA;VENTOLIN HFA) 108 (90 BASE) MCG/ACT inhaler Inhale 2 puffs into the lungs every 6 (six) hours as needed for wheezing.  1 Inhaler  2  . bifidobacterium infantis (ALIGN) capsule Take 1 capsule by mouth daily.  30 capsule  5  . Calcium-Vitamin D 600-200 MG-UNIT per tablet Take 1 tablet by mouth daily. Do not resume this medication until it has been cleared with your Gastroenterologist.  30 tablet  3  . Cyanocobalamin (VITAMIN B-12 IJ) Inject 1 each as directed every 30 (thirty) days.      . diphenoxylate-atropine (LOMOTIL) 2.5-0.025 MG per tablet Take 2 tablets by mouth 4 (four) times daily as needed for diarrhea or loose stools.  30 tablet  0  . HYDROcodone-acetaminophen (NORCO/VICODIN) 5-325  MG per tablet TAKE 1 TABLET BY MOUTH 2 TIMES DAILY AS NEEDED FOR PAIN  60 tablet  0  . loperamide (IMODIUM) 2 MG capsule Take 1 capsule (2 mg total) by mouth 3 (three) times daily as needed for diarrhea or loose stools.  30 capsule  0  . megestrol (MEGACE ORAL) 40 MG/ML suspension Take 5 mLs (200 mg total) by mouth daily.  240 mL  1  . multivitamin (THERAGRAN) per tablet Take 1 tablet by mouth daily.  30 tablet  3  . nystatin-triamcinolone (MYCOLOG II) cream APPLY TO AFFECTED AREA TWICE A DAY  60 g  1  . ondansetron (ZOFRAN) 4 MG tablet Take 1 tablet (4 mg total) by mouth every 12 (twelve) hours as needed for nausea.  30 tablet  1  . pantoprazole (PROTONIX) 40 MG tablet Take 1 tablet (40 mg total) by mouth daily.  30 tablet  5  . potassium chloride SA (K-DUR,KLOR-CON) 20 MEQ tablet Take 1 tablet (20 mEq total) by mouth 2 (two) times daily.  30 tablet  2  . spironolactone (ALDACTONE) 50 MG tablet 50 mg daily.       [name] zolpidem (AMBIEN) 10 MG tablet Take 0.5 tablets (5 mg total) by mouth as needed. insomnia  1 tablet  0   No current facility-administered medications for this visit.     Objective: Blood pressure 100/70, pulse 74, temperature 98.2 F (36.8 C), temperature source Oral, resp. rate 18, height 5\' 4"  (1.626 m), weight 134 lb 4.8 oz (60.918 kg). Patient is alert and in no acute distress. Conjunctiva  is pink. Sclera is nonicteric Oropharyngeal mucosa is normal. No neck masses or thyromegaly noted. Cardiac exam with regular rhythm normal S1 and S2. No murmur or gallop noted. Lungs are clear to auscultation. Abdomen. Bowel sounds are normal. Abdomen is soft and nontender without organomegaly or masses.  No LE edema or clubbing noted.    Assessment:  #1. Crohn's disease. She has had small bowel and anorectal Crohn's disease. 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 If she has developed a fistula  it must be very tiny since she does not have symptoms of vaginitis. #2. History of hypokalemia and hypomagnesemia. These metabolic abnormalities do not appear to be due to GI losses as she does not have diarrhea. #3. Anemia. #4. GERD.   Plan:  Patient will go to the lab for CBC, CRP, serum magnesium urinalysis and metabolic 7. She will make an appointment to see Dr. Mora Appl as soon as possible to find that if she has developed enterovaginal fistula. Will request approval for Remicade infusion both for induction and maintenance. Office visit in two-months.

## 2013-03-21 NOTE — Patient Instructions (Addendum)
Please make an appointment to see Dr. Mora Appl as soon as possible. Physician will contact you with results of blood work and urinalysis. Will initiate infliximab Remicade infusion in near future Take Imodium OTC 2 mg by mouth every morning and can take subsequent doses on as-needed basis

## 2013-03-22 ENCOUNTER — Telehealth (INDEPENDENT_AMBULATORY_CARE_PROVIDER_SITE_OTHER): Payer: Self-pay | Admitting: *Deleted

## 2013-03-22 DIAGNOSIS — N824 Other female intestinal-genital tract fistulae: Secondary | ICD-10-CM | POA: Diagnosis not present

## 2013-03-22 NOTE — Telephone Encounter (Signed)
Calypso LM asking Danielle [name] to return her call at [phone]. She got to see Dr. Mora Appl and would like to discuss this with her.

## 2013-03-22 NOTE — Telephone Encounter (Signed)
Danielle [name] called and stated that she saw Dr.McLeod this morning. 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 This is what is causing her gas. He recommended that she get started back on Remicade as soon as possible, and he wanted to see her again in 4 weeks for a follow up visit. Dr.Rehman was made aware. He gave the following order for Remicade  Infusion 5 mg/kg 0 , 2 weeks, 6 weeks,then every 8 weeks for maintenance. An updated order was faxed to specialty clinic. Patient called and made aware and she states that they had already called her to come on 03/28/13.

## 2013-03-28 ENCOUNTER — Encounter (HOSPITAL_COMMUNITY): Payer: Medicare Other | Attending: Internal Medicine

## 2013-03-28 VITALS — BP 107/68 | HR 76 | Temp 98.0°F | Resp 16 | Wt 134.0 lb

## 2013-03-28 DIAGNOSIS — K5090 Crohn's disease, unspecified, without complications: Secondary | ICD-10-CM | POA: Diagnosis not present

## 2013-03-28 MED ORDER — SODIUM CHLORIDE 0.9 % IV SOLN
INTRAVENOUS | Status: DC
  Administered 2013-03-28: 13:00:00 via INTRAVENOUS

## 2013-03-28 MED ORDER — INFLIXIMAB 100 MG IV SOLR
5.0000 mg/kg | Freq: Once | INTRAVENOUS | Status: AC
  Administered 2013-03-28: 300 mg via INTRAVENOUS
  Filled 2013-03-28: qty 30

## 2013-03-28 MED ORDER — ACETAMINOPHEN 325 MG PO TABS
650.0000 mg | ORAL_TABLET | Freq: Once | ORAL | Status: AC
  Administered 2013-03-28: 650 mg via ORAL

## 2013-03-28 MED ORDER — LORATADINE 10 MG PO TABS
10.0000 mg | ORAL_TABLET | Freq: Every day | ORAL | Status: DC
  Administered 2013-03-28: 10 mg via ORAL

## 2013-03-28 NOTE — Progress Notes (Signed)
Tolerated well

## 2013-03-29 DIAGNOSIS — K5090 Crohn's disease, unspecified, without complications: Secondary | ICD-10-CM | POA: Diagnosis not present

## 2013-03-29 DIAGNOSIS — E876 Hypokalemia: Secondary | ICD-10-CM | POA: Diagnosis not present

## 2013-03-29 DIAGNOSIS — D649 Anemia, unspecified: Secondary | ICD-10-CM | POA: Diagnosis not present

## 2013-03-29 LAB — BASIC METABOLIC PANEL WITH GFR
BUN: 18 mg/dL (ref 6–23)
CO2: 17 mEq/L — ABNORMAL LOW (ref 19–32)
GFR, Est African American: 49 mL/min — ABNORMAL LOW
Glucose, Bld: 77 mg/dL (ref 70–99)
Potassium: 3.3 mEq/L — ABNORMAL LOW (ref 3.5–5.3)
Sodium: 135 mEq/L (ref 135–145)

## 2013-03-29 LAB — CBC
HCT: 29.1 % — ABNORMAL LOW (ref 36.0–46.0)
Hemoglobin: 9.2 g/dL — ABNORMAL LOW (ref 12.0–15.0)
MCHC: 31.6 g/dL (ref 30.0–36.0)
RBC: 3.25 MIL/uL — ABNORMAL LOW (ref 3.87–5.11)

## 2013-03-29 LAB — MAGNESIUM: Magnesium: 0.8 mg/dL — ABNORMAL LOW (ref 1.5–2.5)

## 2013-03-30 LAB — URINALYSIS
Glucose, UA: NEGATIVE mg/dL
Nitrite: NEGATIVE
Specific Gravity, Urine: 1.016 (ref 1.005–1.030)
pH: 6.5 (ref 5.0–8.0)

## 2013-04-04 ENCOUNTER — Other Ambulatory Visit (INDEPENDENT_AMBULATORY_CARE_PROVIDER_SITE_OTHER): Payer: Self-pay | Admitting: Internal Medicine

## 2013-04-04 MED ORDER — MAGNESIUM OXIDE 250 MG PO TABS: 1.0000 | ORAL_TABLET | Freq: Every day | ORAL | Status: DC

## 2013-04-04 MED ORDER — DICYCLOMINE HCL 10 MG/5ML PO SOLN: 10.0000 mg | Freq: Three times a day (TID) | ORAL | Status: DC

## 2013-04-05 ENCOUNTER — Telehealth (INDEPENDENT_AMBULATORY_CARE_PROVIDER_SITE_OTHER): Payer: Self-pay | Admitting: *Deleted

## 2013-04-05 DIAGNOSIS — D649 Anemia, unspecified: Secondary | ICD-10-CM

## 2013-04-05 NOTE — Telephone Encounter (Signed)
Patient called, question answered

## 2013-04-05 NOTE — Telephone Encounter (Signed)
LM stating it was very important that she speaks with [name] before she goes to anothers doctors apt at 4:00 pm today, 04/05/13. Quintella is needing [name] to give and/or ask Dr. Karilyn Cota something. The return phone number is [phone] or [phone].

## 2013-04-05 NOTE — Telephone Encounter (Signed)
Per Dr.Rehman the patient will need to have labs drawn. 

## 2013-04-08 IMAGING — CR DG ABDOMEN ACUTE W/ 1V CHEST
3 series · 3 of 3 positions shown · non-contrast
Comparison: Chest and abdominal radiographs performed 12/15/2011

CLINICAL DATA: Vomiting, diarrhea and body aches.

ACUTE ABDOMEN SERIES (ABDOMEN 2 VIEW & CHEST 1 VIEW)

[w chest pa]
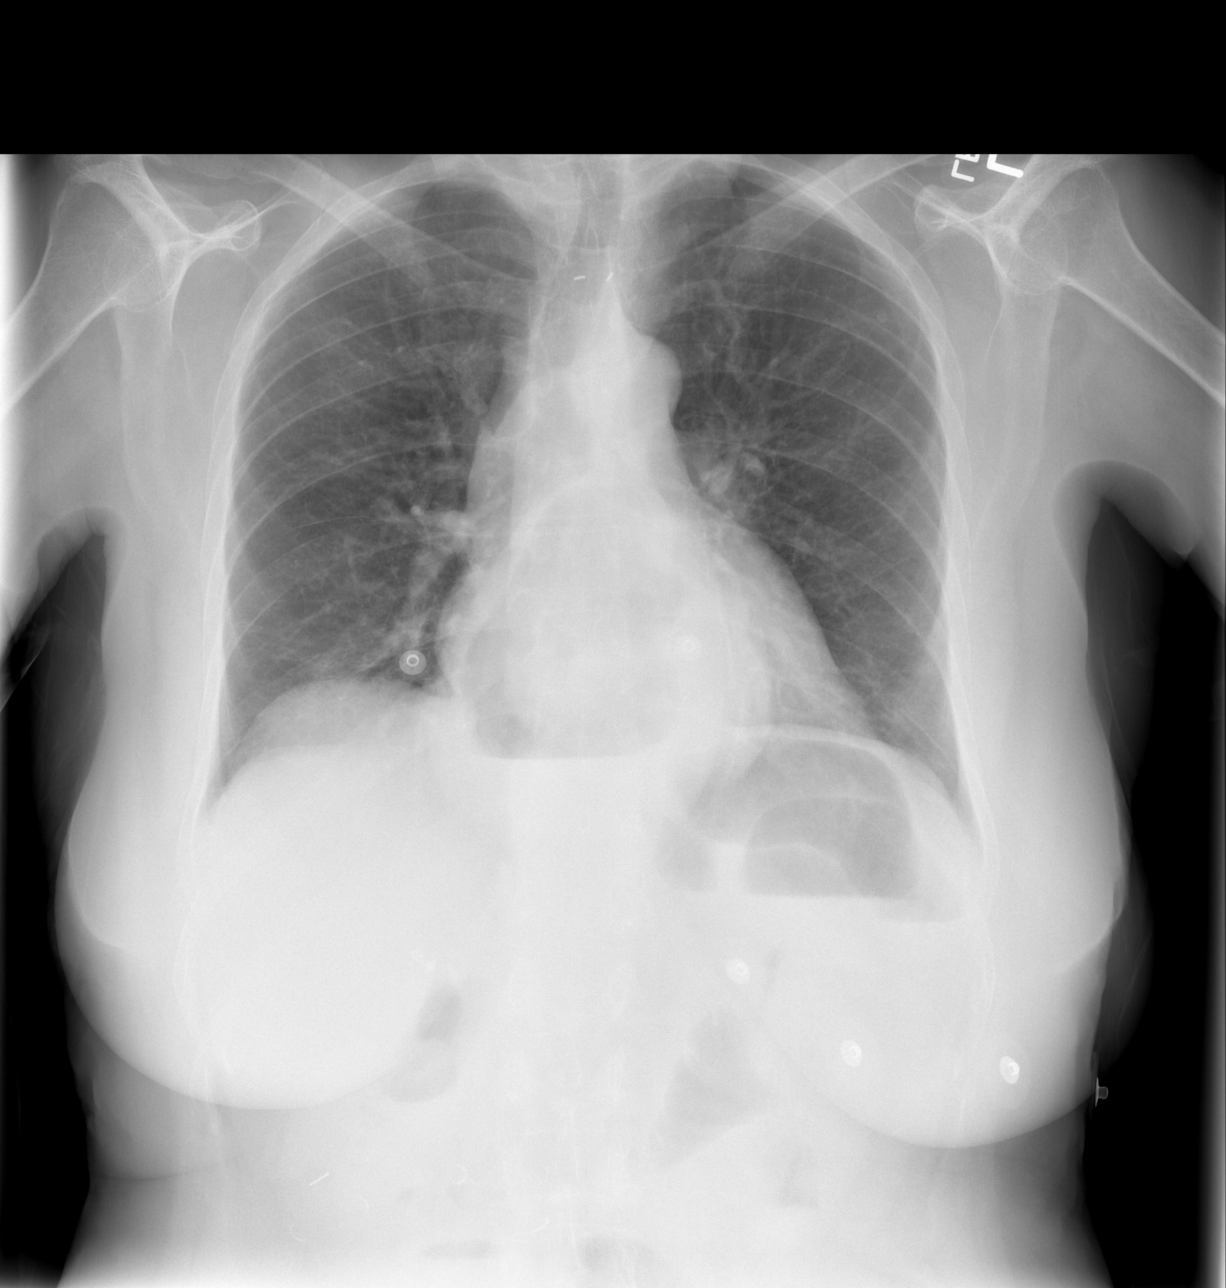

[w abdomen upright]
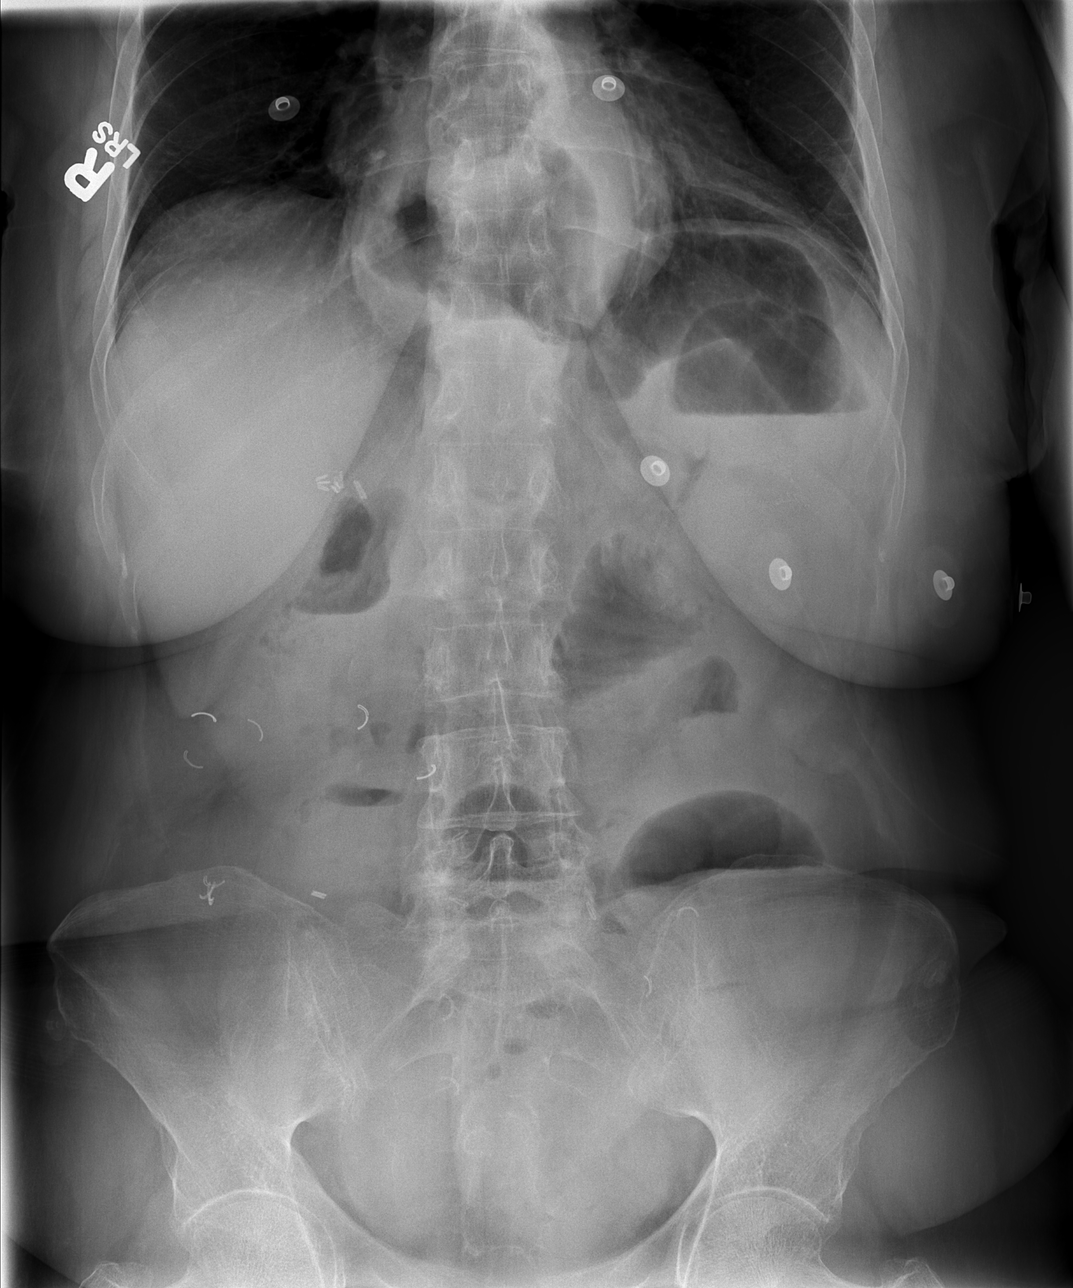

[t abdomen supine]
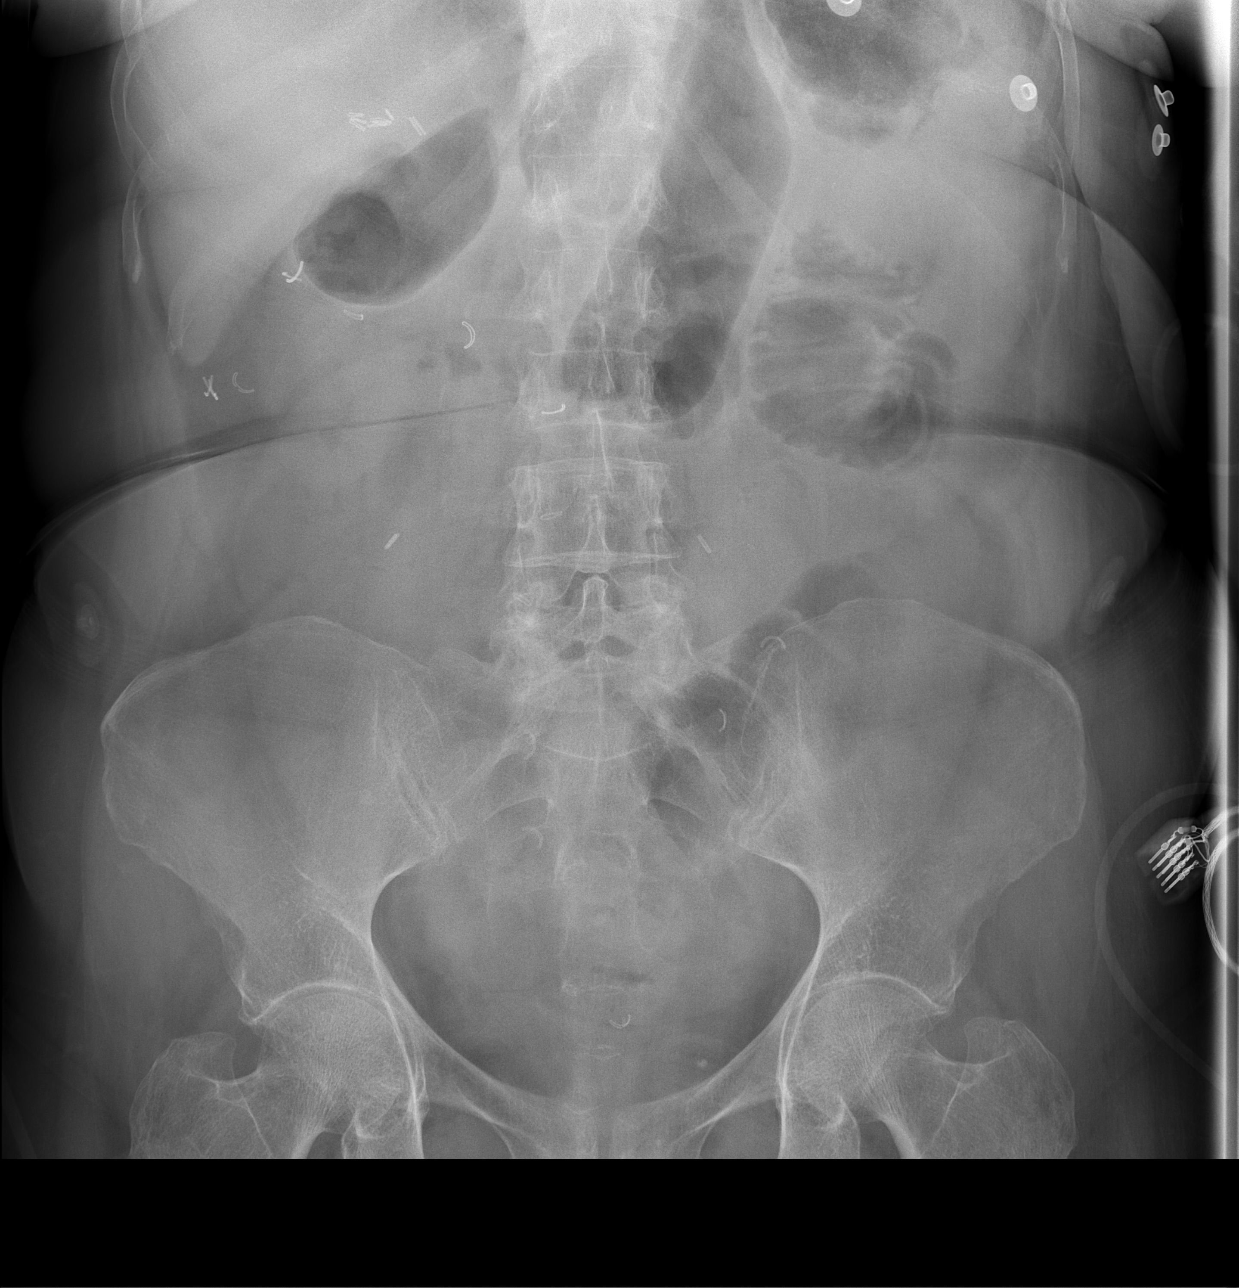

[3 of 3 positions shown; findings below may reference images not displayed]

FINDINGS: The lungs are well-aerated.  Minimal bilateral airspace
opacities could reflect mild pneumonia.  There is no evidence of
pleural effusion or pneumothorax.  The cardiomediastinal silhouette
is within normal limits.

A relatively large hiatal hernia is noted, with an air-fluid level.
A nonspecific bowel gas pattern is noted, with segments of small
bowel distension, measuring up to 4.5 cm in diameter, and
associated air-fluid levels.  However, air is also seen within the
colon.  The pattern suggests against bowel obstruction.  No free
intra-abdominal air is identified on the provided upright view.

No acute osseous abnormalities are seen; the sacroiliac joints are
unremarkable in appearance.  Scattered postoperative change is
noted along the right side of the abdomen.
IMPRESSION: 1.  Nonspecific bowel gas pattern, with segments of small bowel
distension, measuring up to 4.5 cm in diameter, and associated air-
fluid levels.  However, air also seen within the colon.  The
pattern suggests against bowel obstruction.  No free intra-
abdominal air seen.
2.  Relatively large hiatal hernia noted.
3.  Minimal bilateral airspace opacities could reflect mild
pneumonia.

## 2013-04-10 ENCOUNTER — Telehealth (INDEPENDENT_AMBULATORY_CARE_PROVIDER_SITE_OTHER): Payer: Self-pay | Admitting: *Deleted

## 2013-04-10 NOTE — Telephone Encounter (Signed)
Would like for Danielle [name] to return her call at [phone]. Dr. Karilyn Cota has put her on Magnesium and it is making her lips and feet swell. This also happened while she was in he hospital on the same medication.

## 2013-04-10 NOTE — Telephone Encounter (Signed)
Patient was called and advised per Dr.Rehman's recommendation to go to [facility] or her Pharmacy and get the lower dose of Magnesium and try that. He states that her symptoms may be coming from the components of her current medication. He feels that she should be on this medication as to prevent her Potassium from dropping. Danielle [name] was also advised of this.

## 2013-04-10 NOTE — Telephone Encounter (Signed)
Noted , this will be addressed with Dr.Rehman when he gets back from [facility]

## 2013-04-11 ENCOUNTER — Encounter (HOSPITAL_COMMUNITY): Payer: Medicare Other | Attending: Internal Medicine

## 2013-04-11 ENCOUNTER — Telehealth (INDEPENDENT_AMBULATORY_CARE_PROVIDER_SITE_OTHER): Payer: Self-pay | Admitting: *Deleted

## 2013-04-11 ENCOUNTER — Encounter (HOSPITAL_COMMUNITY): Payer: Self-pay

## 2013-04-11 ENCOUNTER — Other Ambulatory Visit (INDEPENDENT_AMBULATORY_CARE_PROVIDER_SITE_OTHER): Payer: Self-pay | Admitting: Internal Medicine

## 2013-04-11 VITALS — BP 108/59 | HR 90 | Temp 98.2°F | Resp 20 | Wt 141.1 lb

## 2013-04-11 DIAGNOSIS — K5090 Crohn's disease, unspecified, without complications: Secondary | ICD-10-CM | POA: Diagnosis not present

## 2013-04-11 IMAGING — CT CT ENTEROGRAPHY (ABD-PELV W/ CM)
2 of 8 series · 16 of 46 positions shown, 18 images · IV contrast (APPLIED)
Comparison: CT scan 12/20/2011

CLINICAL DATA: Crohn's disease, abdominal pain, concern for active
Crohn's disease.

CT ABDOMEN AND PELVIS WITH CONTRAST (CT ENTEROGRAPHY)
TECHNIQUE: Multidetector CT of the abdomen and pelvis during bolus
administration of intravenous contrast. Negative oral contrast
VoLumen was given.
Contrast: 100mL OMNIPAQUE IOHEXOL 300 MG/ML  SOLN

[Series 7: abd/pelv with 2.0 b31f st · axial · 0.68mm/px · z∈[-224,+140]mm · 13 of 208 slices shown, 15 images]
[im 13/208  soft-tissue]
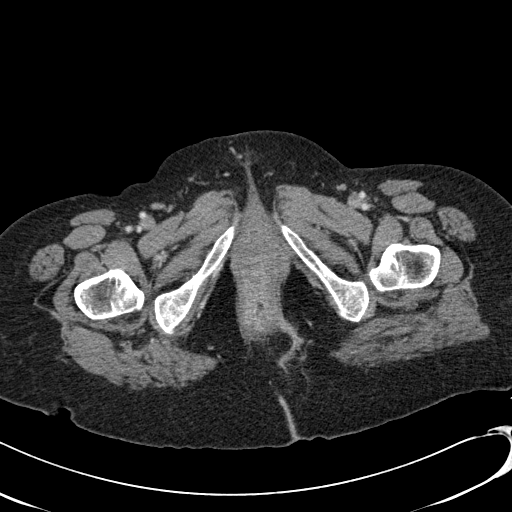
[im 13/208  bone]
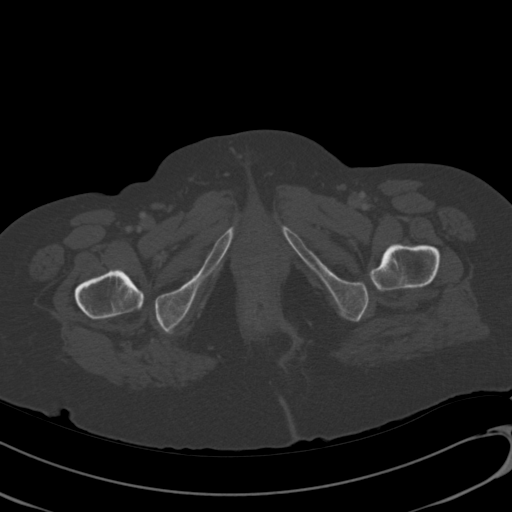
[im 25/208  soft-tissue]
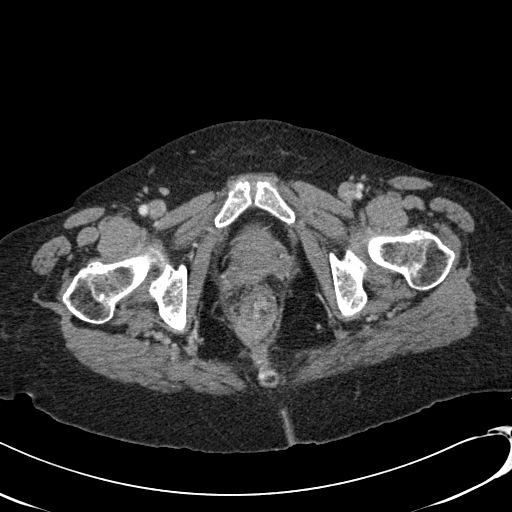
[im 49/208  soft-tissue]
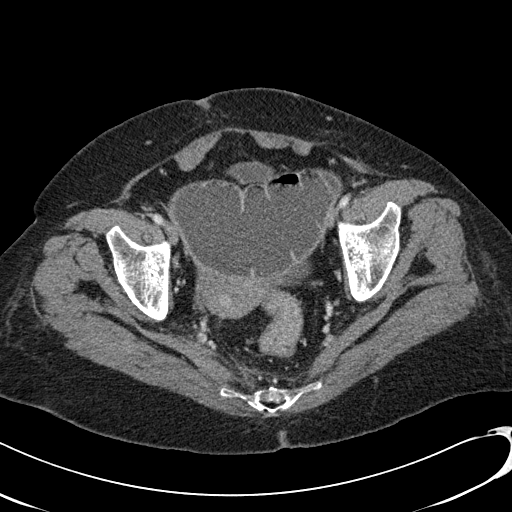
[im 61/208  soft-tissue]
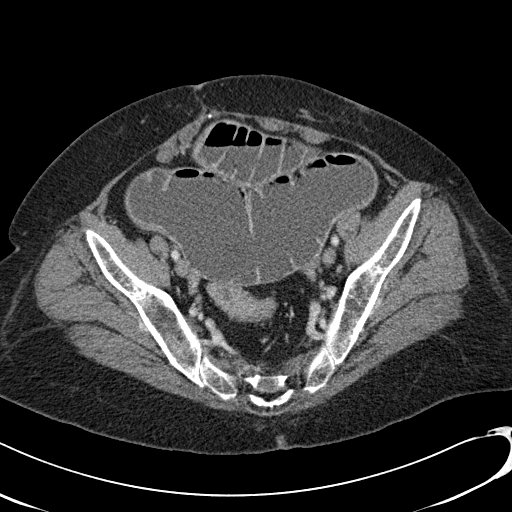
[im 74/208  soft-tissue]
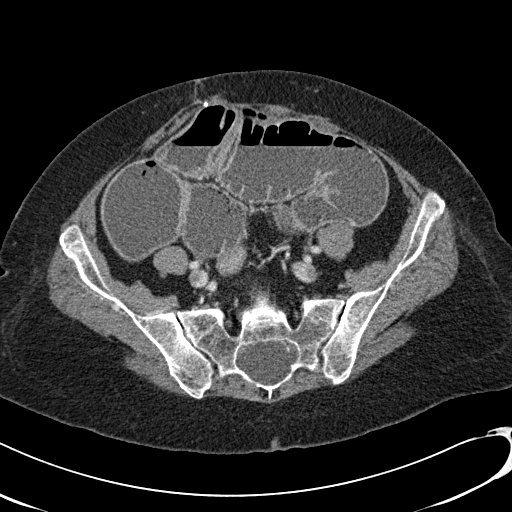
[im 86/208  soft-tissue]
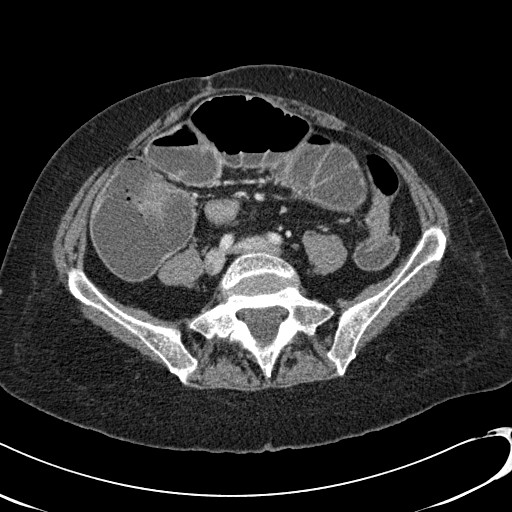
[im 110/208  soft-tissue]
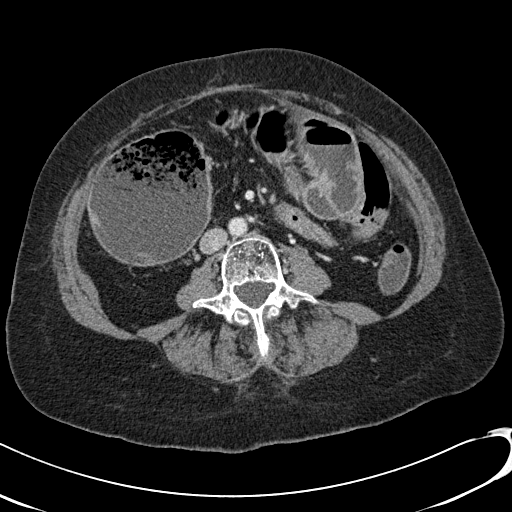
[im 122/208  soft-tissue]
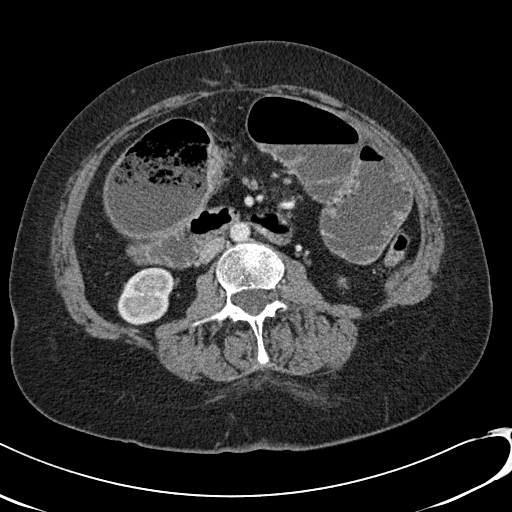
[im 134/208  soft-tissue]
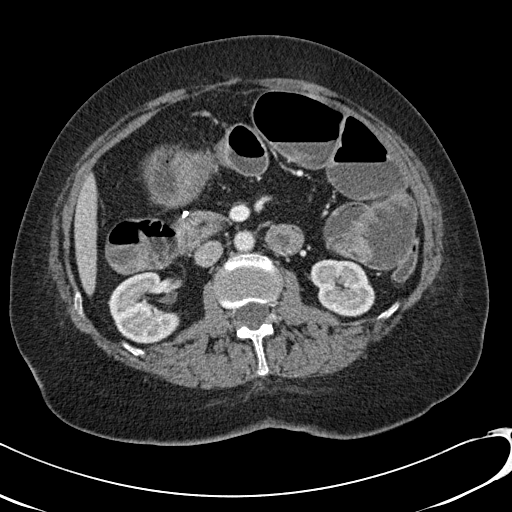
[im 134/208  bone]
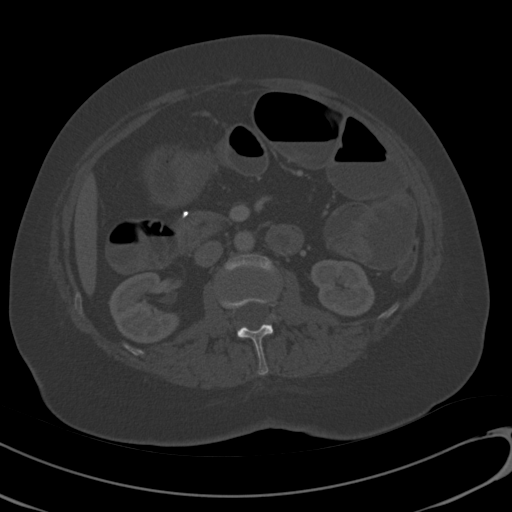
[im 147/208  soft-tissue]
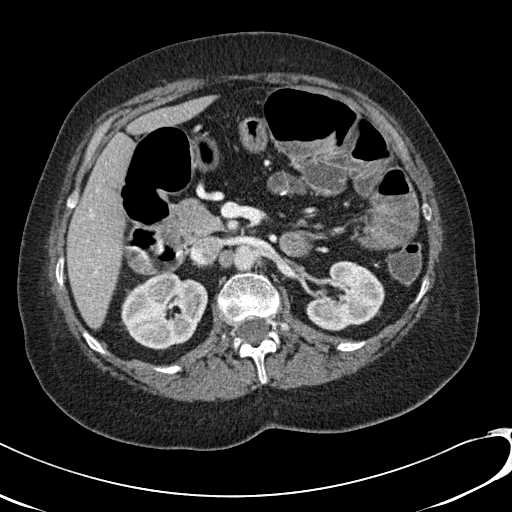
[im 159/208  soft-tissue]
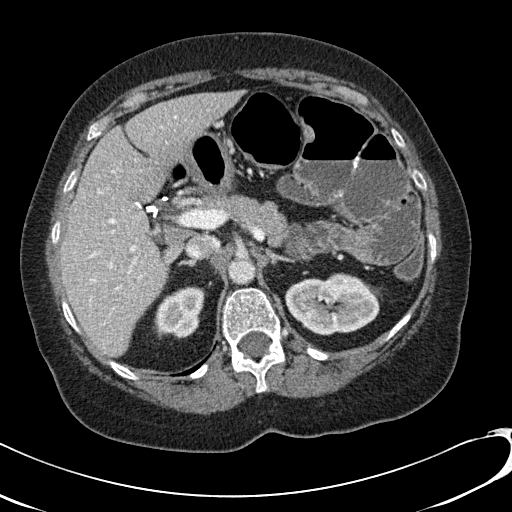
[im 183/208  soft-tissue]
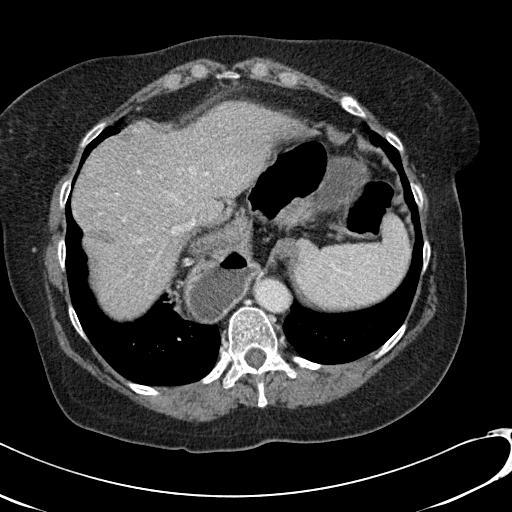
[im 195/208  soft-tissue]
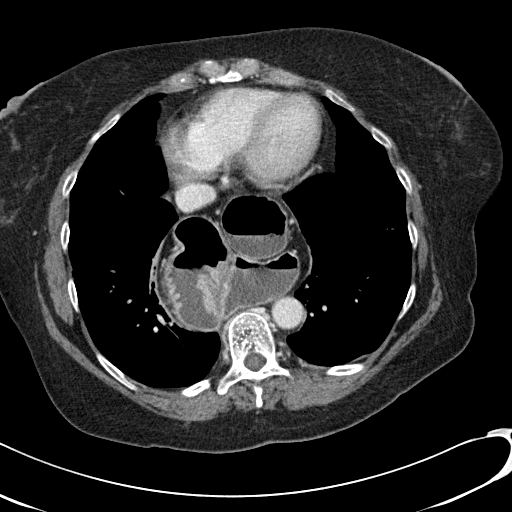

[Series 8: coronals · coronal · 0.91mm/px · 3 of 119 slices shown]
[im 40/119  soft-tissue]
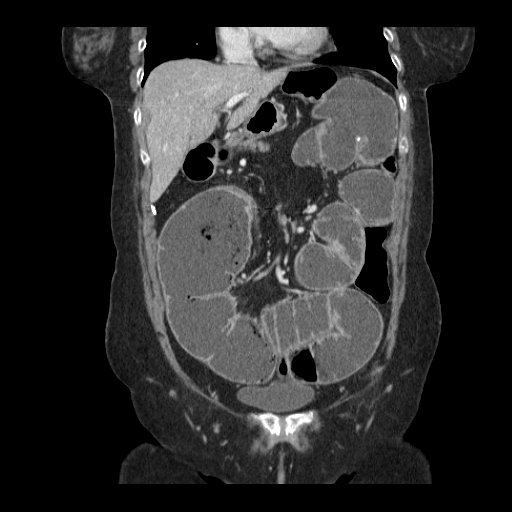
[im 53/119  soft-tissue]
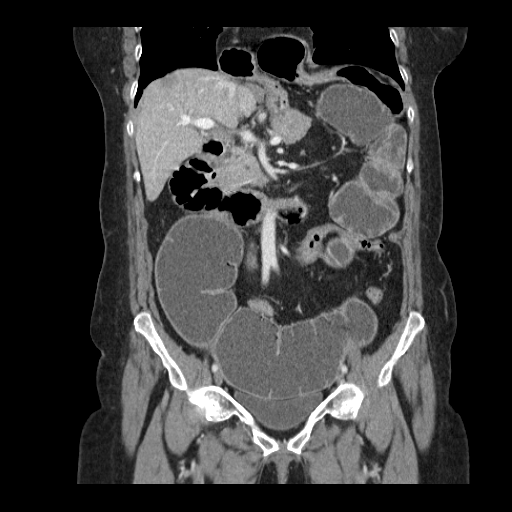
[im 66/119  soft-tissue]
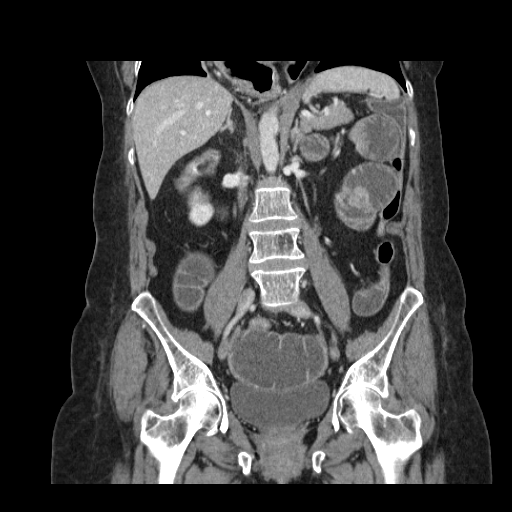

[16 of 46 positions shown; findings below may reference images not displayed]

FINDINGS: Gastrointestinal:  There is  a large hiatal hernia present.  This
hernia sac appears to contain not only the stomach but a loop of
small bowel.  Entirety of the hernia not is not imaged but the gas
esophageal junction appears to be above the diaphragm.  Duodenum
appears normal. The small bowel is dilated significantly up to 7
cm.  This is increased from 5 cm on comparison exam.  There is
dilatation is secondary to a stricture within the midabdomen over
approximately 2 cm segment of small bowel (image 29, series 8 and
image 79, series 7).  Proximal to the stricture there is stasis of
enteric contents as well as dilatation of the small bowel.  There
is second region of potential narrowing involving the more distal
loops of small bowel in the left abdomen (image 46, series 2).  No
significant obstruction at this level.

There appears to be a near total colectomy.  The distal small
bowel/colon appear normal.

No evidence of pneumatosis or intraperitoneal free air.

Lung bases are clear.  No pericardial fluid.  No focal hepatic
lesion.  The gallbladder is absent.  The pancreas, spleen, adrenal
glands, kidneys are normal.  Abdominal aorta normal caliber.  No
retroperitoneal periportal lymphadenopathy.

No free fluid the pelvis.  The uterus and ovaries are unremarkable.
The bladder is normal. Review of  bone windows demonstrates no
aggressive osseous lesions.
IMPRESSION: 1..  Worsening of high-grade partial small bowel obstruction with a
segment of tight stricturing in the mid small bowel with
significant dilatation proximal to the stricture as well as enteric
stasis.
2.  Large hiatal hernia which contains stomach and a loop of small
bowel.
3.  Potential areas of stricturing within the more distal small
bowel in the left abdomen without evidence of significant
obstruction.

## 2013-04-11 MED ORDER — ACETAMINOPHEN 325 MG PO TABS
ORAL_TABLET | ORAL | Status: AC
  Filled 2013-04-11: qty 2

## 2013-04-11 MED ORDER — ACETAMINOPHEN 325 MG PO TABS
650.0000 mg | ORAL_TABLET | Freq: Once | ORAL | Status: AC
  Administered 2013-04-11: 650 mg via ORAL

## 2013-04-11 MED ORDER — LORATADINE 10 MG PO TABS
10.0000 mg | ORAL_TABLET | Freq: Once | ORAL | Status: AC
  Administered 2013-04-11: 10 mg via ORAL

## 2013-04-11 MED ORDER — SODIUM CHLORIDE 0.9 % IV SOLN: Freq: Once | INTRAVENOUS | Status: DC

## 2013-04-11 MED ORDER — SODIUM CHLORIDE 0.9 % IV SOLN
5.0000 mg/kg | Freq: Once | INTRAVENOUS | Status: AC
  Administered 2013-04-11: 300 mg via INTRAVENOUS
  Filled 2013-04-11: qty 30

## 2013-04-11 MED ORDER — LORATADINE 10 MG PO TABS
ORAL_TABLET | ORAL | Status: AC
  Filled 2013-04-11: qty 1

## 2013-04-11 MED ORDER — NYSTATIN-TRIAMCINOLONE 100000-0.1 UNIT/GM-% EX CREA: TOPICAL_CREAM | Freq: Two times a day (BID) | CUTANEOUS | Status: DC

## 2013-04-11 NOTE — Progress Notes (Signed)
Tolerated infusion well. 

## 2013-04-11 NOTE — Telephone Encounter (Signed)
Prescription sent to her pharmacy

## 2013-04-11 NOTE — Progress Notes (Signed)
Tolerated well

## 2013-04-11 NOTE — Telephone Encounter (Signed)
Patient is requesting a refill on her Nystatin.

## 2013-04-13 ENCOUNTER — Telehealth (INDEPENDENT_AMBULATORY_CARE_PROVIDER_SITE_OTHER): Payer: Self-pay | Admitting: *Deleted

## 2013-04-13 NOTE — Telephone Encounter (Signed)
No answer at home #

## 2013-04-13 NOTE — Telephone Encounter (Signed)
Per Dr.Rehman the patient should take Prednisone 20 mg for 2 days , 15 mg for 2 days , 10 mg for 2 days , and 2 mg for 2 days then stop. I called the house no answer but did call [place] her sister and gave her the recommendations of Dr.Rehman, She is going to talk with [place] Mole and see if she had any if not a message would be left on my voice mail for it to be called in in the morning.

## 2013-04-13 NOTE — Telephone Encounter (Signed)
LM asking for Danielle [name] to return her call at [phone]. She has a rash from the medicine Dr. Karilyn Cota has her on. She is needing something called in for this. "Please Danielle [name] call, I need to speak with you."

## 2013-04-13 NOTE — Telephone Encounter (Signed)
I talked with Danielle [name] and she ask if we would call in something for the rash that she has on her chest. Denied itching.Patient advised that I would ask Dr.Rhemna and or Terri Setzer,NP to see if they would call her something in.

## 2013-04-14 ENCOUNTER — Other Ambulatory Visit (INDEPENDENT_AMBULATORY_CARE_PROVIDER_SITE_OTHER): Payer: Self-pay | Admitting: Internal Medicine

## 2013-04-14 DIAGNOSIS — R21 Rash and other nonspecific skin eruption: Secondary | ICD-10-CM

## 2013-04-14 MED ORDER — PREDNISONE 10 MG PO TABS: 5.0000 mg | ORAL_TABLET | Freq: Every day | ORAL | Status: DC

## 2013-04-14 NOTE — Telephone Encounter (Signed)
Already addressed

## 2013-04-14 NOTE — Telephone Encounter (Signed)
Dorene Ar, NP called this medication in to [facility] on 04/14/13.

## 2013-04-18 DIAGNOSIS — D649 Anemia, unspecified: Secondary | ICD-10-CM | POA: Diagnosis not present

## 2013-04-18 LAB — CBC WITH DIFFERENTIAL/PLATELET
Basophils Relative: 0 % (ref 0–1)
HCT: 34.6 % — ABNORMAL LOW (ref 36.0–46.0)
Hemoglobin: 11 g/dL — ABNORMAL LOW (ref 12.0–15.0)
Lymphocytes Relative: 16 % (ref 12–46)
Lymphs Abs: 2.7 10*3/uL (ref 0.7–4.0)
MCHC: 31.8 g/dL (ref 30.0–36.0)
Monocytes Absolute: 1.4 10*3/uL — ABNORMAL HIGH (ref 0.1–1.0)
Monocytes Relative: 9 % (ref 3–12)
Neutro Abs: 12.2 10*3/uL — ABNORMAL HIGH (ref 1.7–7.7)
Neutrophils Relative %: 75 % (ref 43–77)
RBC: 3.73 MIL/uL — ABNORMAL LOW (ref 3.87–5.11)
WBC: 16.4 10*3/uL — ABNORMAL HIGH (ref 4.0–10.5)

## 2013-04-19 DIAGNOSIS — N824 Other female intestinal-genital tract fistulae: Secondary | ICD-10-CM | POA: Diagnosis not present

## 2013-04-19 DIAGNOSIS — K5010 Crohn's disease of large intestine without complications: Secondary | ICD-10-CM | POA: Diagnosis not present

## 2013-04-19 LAB — BASIC METABOLIC PANEL
Chloride: 108 mEq/L (ref 96–112)
Glucose, Bld: 86 mg/dL (ref 70–99)
Potassium: 4.5 mEq/L (ref 3.5–5.3)
Sodium: 136 mEq/L (ref 135–145)

## 2013-04-19 LAB — MAGNESIUM: Magnesium: 1.5 mg/dL (ref 1.5–2.5)

## 2013-04-24 ENCOUNTER — Telehealth (INDEPENDENT_AMBULATORY_CARE_PROVIDER_SITE_OTHER): Payer: Self-pay | Admitting: *Deleted

## 2013-04-24 DIAGNOSIS — D649 Anemia, unspecified: Secondary | ICD-10-CM

## 2013-04-24 NOTE — Telephone Encounter (Signed)
Per Dr.Rehman the patient will need to have labs drawn. 

## 2013-04-26 DIAGNOSIS — K5090 Crohn's disease, unspecified, without complications: Secondary | ICD-10-CM | POA: Diagnosis not present

## 2013-04-26 DIAGNOSIS — D510 Vitamin B12 deficiency anemia due to intrinsic factor deficiency: Secondary | ICD-10-CM | POA: Diagnosis not present

## 2013-04-26 DIAGNOSIS — L259 Unspecified contact dermatitis, unspecified cause: Secondary | ICD-10-CM | POA: Diagnosis not present

## 2013-04-28 ENCOUNTER — Encounter (HOSPITAL_COMMUNITY): Payer: Self-pay | Admitting: Emergency Medicine

## 2013-04-28 ENCOUNTER — Other Ambulatory Visit: Payer: Self-pay

## 2013-04-28 DIAGNOSIS — S32009A Unspecified fracture of unspecified lumbar vertebra, initial encounter for closed fracture: Secondary | ICD-10-CM | POA: Diagnosis present

## 2013-04-28 DIAGNOSIS — IMO0002 Reserved for concepts with insufficient information to code with codable children: Secondary | ICD-10-CM | POA: Diagnosis not present

## 2013-04-28 DIAGNOSIS — R079 Chest pain, unspecified: Secondary | ICD-10-CM | POA: Diagnosis not present

## 2013-04-28 DIAGNOSIS — J9819 Other pulmonary collapse: Principal | ICD-10-CM | POA: Diagnosis present

## 2013-04-28 DIAGNOSIS — X58XXXA Exposure to other specified factors, initial encounter: Secondary | ICD-10-CM | POA: Diagnosis present

## 2013-04-28 DIAGNOSIS — J189 Pneumonia, unspecified organism: Secondary | ICD-10-CM | POA: Diagnosis not present

## 2013-04-28 DIAGNOSIS — R609 Edema, unspecified: Secondary | ICD-10-CM | POA: Diagnosis not present

## 2013-04-28 DIAGNOSIS — A419 Sepsis, unspecified organism: Secondary | ICD-10-CM | POA: Diagnosis not present

## 2013-04-28 DIAGNOSIS — M47814 Spondylosis without myelopathy or radiculopathy, thoracic region: Secondary | ICD-10-CM | POA: Diagnosis not present

## 2013-04-28 DIAGNOSIS — M8448XA Pathological fracture, other site, initial encounter for fracture: Secondary | ICD-10-CM | POA: Diagnosis not present

## 2013-04-28 DIAGNOSIS — K5090 Crohn's disease, unspecified, without complications: Secondary | ICD-10-CM | POA: Diagnosis not present

## 2013-04-28 DIAGNOSIS — D72829 Elevated white blood cell count, unspecified: Secondary | ICD-10-CM | POA: Diagnosis not present

## 2013-04-28 DIAGNOSIS — R0602 Shortness of breath: Secondary | ICD-10-CM | POA: Diagnosis not present

## 2013-04-28 DIAGNOSIS — I498 Other specified cardiac arrhythmias: Secondary | ICD-10-CM | POA: Diagnosis present

## 2013-04-28 DIAGNOSIS — Z87891 Personal history of nicotine dependence: Secondary | ICD-10-CM | POA: Diagnosis not present

## 2013-04-28 DIAGNOSIS — M549 Dorsalgia, unspecified: Secondary | ICD-10-CM | POA: Diagnosis not present

## 2013-04-28 DIAGNOSIS — D649 Anemia, unspecified: Secondary | ICD-10-CM | POA: Diagnosis not present

## 2013-04-28 DIAGNOSIS — Z79899 Other long term (current) drug therapy: Secondary | ICD-10-CM | POA: Diagnosis not present

## 2013-04-28 DIAGNOSIS — K219 Gastro-esophageal reflux disease without esophagitis: Secondary | ICD-10-CM | POA: Diagnosis present

## 2013-04-28 LAB — POCT I-STAT TROPONIN I: Troponin i, poc: 0.01 ng/mL (ref 0.00–0.08)

## 2013-04-28 LAB — CBC
HCT: 33 % — ABNORMAL LOW (ref 36.0–46.0)
Hemoglobin: 10.6 g/dL — ABNORMAL LOW (ref 12.0–15.0)
MCHC: 32.1 g/dL (ref 30.0–36.0)
MCV: 92.7 fL (ref 78.0–100.0)
WBC: 20.7 10*3/uL — ABNORMAL HIGH (ref 4.0–10.5)

## 2013-04-28 LAB — BASIC METABOLIC PANEL
BUN: 20 mg/dL (ref 6–23)
Chloride: 104 mEq/L (ref 96–112)
Creatinine, Ser: 1.27 mg/dL — ABNORMAL HIGH (ref 0.50–1.10)
Glucose, Bld: 91 mg/dL (ref 70–99)
Potassium: 3.9 mEq/L (ref 3.5–5.1)

## 2013-04-28 MED ORDER — NITROGLYCERIN 0.4 MG SL SUBL: 0.4000 mg | SUBLINGUAL_TABLET | SUBLINGUAL | Status: DC | PRN

## 2013-04-28 NOTE — ED Notes (Signed)
PT. REPORTS MID CHEST PAIN / TIGHTNESS WITH SOB ONSET THIS EVENING WHILE WORKING AT THE [employer] , ALSO REPORTS LOW BACK PAIN DENIES INJURY OR FALL.

## 2013-04-29 ENCOUNTER — Inpatient Hospital Stay (HOSPITAL_COMMUNITY): Payer: Medicare Other

## 2013-04-29 ENCOUNTER — Encounter (HOSPITAL_COMMUNITY): Payer: Self-pay | Admitting: Internal Medicine

## 2013-04-29 ENCOUNTER — Other Ambulatory Visit (HOSPITAL_COMMUNITY): Payer: Medicare Other

## 2013-04-29 ENCOUNTER — Emergency Department (HOSPITAL_COMMUNITY): Payer: Medicare Other

## 2013-04-29 ENCOUNTER — Inpatient Hospital Stay (HOSPITAL_COMMUNITY)
Admission: EM | Admit: 2013-04-29 | Discharge: 2013-04-30 | DRG: 206 | Disposition: A | Discharge status: Home Health | Payer: Medicare Other | Attending: Internal Medicine | Admitting: Internal Medicine

## 2013-04-29 DIAGNOSIS — D649 Anemia, unspecified: Secondary | ICD-10-CM

## 2013-04-29 DIAGNOSIS — E871 Hypo-osmolality and hyponatremia: Secondary | ICD-10-CM

## 2013-04-29 DIAGNOSIS — R079 Chest pain, unspecified: Secondary | ICD-10-CM

## 2013-04-29 DIAGNOSIS — K5090 Crohn's disease, unspecified, without complications: Secondary | ICD-10-CM

## 2013-04-29 DIAGNOSIS — R0602 Shortness of breath: Secondary | ICD-10-CM

## 2013-04-29 DIAGNOSIS — K611 Rectal abscess: Secondary | ICD-10-CM

## 2013-04-29 DIAGNOSIS — J9811 Atelectasis: Secondary | ICD-10-CM

## 2013-04-29 DIAGNOSIS — M545 Low back pain, unspecified: Secondary | ICD-10-CM

## 2013-04-29 DIAGNOSIS — R112 Nausea with vomiting, unspecified: Secondary | ICD-10-CM

## 2013-04-29 DIAGNOSIS — E860 Dehydration: Secondary | ICD-10-CM

## 2013-04-29 DIAGNOSIS — R531 Weakness: Secondary | ICD-10-CM

## 2013-04-29 DIAGNOSIS — K56609 Unspecified intestinal obstruction, unspecified as to partial versus complete obstruction: Secondary | ICD-10-CM

## 2013-04-29 DIAGNOSIS — R432 Parageusia: Secondary | ICD-10-CM

## 2013-04-29 DIAGNOSIS — R059 Cough, unspecified: Secondary | ICD-10-CM

## 2013-04-29 DIAGNOSIS — M549 Dorsalgia, unspecified: Secondary | ICD-10-CM

## 2013-04-29 DIAGNOSIS — R609 Edema, unspecified: Secondary | ICD-10-CM

## 2013-04-29 DIAGNOSIS — K219 Gastro-esophageal reflux disease without esophagitis: Secondary | ICD-10-CM

## 2013-04-29 DIAGNOSIS — D72829 Elevated white blood cell count, unspecified: Secondary | ICD-10-CM

## 2013-04-29 DIAGNOSIS — E876 Hypokalemia: Secondary | ICD-10-CM

## 2013-04-29 DIAGNOSIS — D75839 Thrombocytosis, unspecified: Secondary | ICD-10-CM

## 2013-04-29 DIAGNOSIS — T148XXA Other injury of unspecified body region, initial encounter: Secondary | ICD-10-CM

## 2013-04-29 DIAGNOSIS — E872 Acidosis, unspecified: Secondary | ICD-10-CM

## 2013-04-29 DIAGNOSIS — N390 Urinary tract infection, site not specified: Secondary | ICD-10-CM

## 2013-04-29 DIAGNOSIS — R197 Diarrhea, unspecified: Secondary | ICD-10-CM

## 2013-04-29 DIAGNOSIS — N179 Acute kidney failure, unspecified: Secondary | ICD-10-CM

## 2013-04-29 DIAGNOSIS — J189 Pneumonia, unspecified organism: Secondary | ICD-10-CM

## 2013-04-29 DIAGNOSIS — A419 Sepsis, unspecified organism: Secondary | ICD-10-CM

## 2013-04-29 DIAGNOSIS — R1310 Dysphagia, unspecified: Secondary | ICD-10-CM

## 2013-04-29 DIAGNOSIS — S32010S Wedge compression fracture of first lumbar vertebra, sequela: Secondary | ICD-10-CM

## 2013-04-29 DIAGNOSIS — K449 Diaphragmatic hernia without obstruction or gangrene: Secondary | ICD-10-CM

## 2013-04-29 DIAGNOSIS — R05 Cough: Secondary | ICD-10-CM

## 2013-04-29 DIAGNOSIS — D473 Essential (hemorrhagic) thrombocythemia: Secondary | ICD-10-CM

## 2013-04-29 DIAGNOSIS — R7989 Other specified abnormal findings of blood chemistry: Secondary | ICD-10-CM

## 2013-04-29 LAB — CBC WITH DIFFERENTIAL/PLATELET
Basophils Relative: 1 % (ref 0–1)
Hemoglobin: 11.1 g/dL — ABNORMAL LOW (ref 12.0–15.0)
Lymphs Abs: 3.9 10*3/uL (ref 0.7–4.0)
Monocytes Relative: 11 % (ref 3–12)
Neutro Abs: 12.9 10*3/uL — ABNORMAL HIGH (ref 1.7–7.7)
Neutrophils Relative %: 68 % (ref 43–77)
RBC: 3.75 MIL/uL — ABNORMAL LOW (ref 3.87–5.11)

## 2013-04-29 LAB — COMPREHENSIVE METABOLIC PANEL
BUN: 17 mg/dL (ref 6–23)
CO2: 17 mEq/L — ABNORMAL LOW (ref 19–32)
Calcium: 9 mg/dL (ref 8.4–10.5)
Creatinine, Ser: 1.26 mg/dL — ABNORMAL HIGH (ref 0.50–1.10)
GFR calc Af Amer: 51 mL/min — ABNORMAL LOW (ref >90)
GFR calc non Af Amer: 44 mL/min — ABNORMAL LOW (ref >90)
Glucose, Bld: 93 mg/dL (ref 70–99)

## 2013-04-29 LAB — TROPONIN I: Troponin I: <0.3 ng/mL (ref <0.30)

## 2013-04-29 MED ORDER — VANCOMYCIN HCL 500 MG IV SOLR
500.0000 mg | Freq: Two times a day (BID) | INTRAVENOUS | Status: DC
  Filled 2013-04-29: qty 500

## 2013-04-29 MED ORDER — ALBUTEROL SULFATE HFA 108 (90 BASE) MCG/ACT IN AERS
2.0000 | INHALATION_SPRAY | Freq: Four times a day (QID) | RESPIRATORY_TRACT | Status: DC | PRN
  Filled 2013-04-29: qty 6.7

## 2013-04-29 MED ORDER — DEXTROSE 5 % IV SOLN
1.0000 g | Freq: Two times a day (BID) | INTRAVENOUS | Status: DC
  Administered 2013-04-29: 1 g via INTRAVENOUS
  Filled 2013-04-29 (×2): qty 1

## 2013-04-29 MED ORDER — LEVOFLOXACIN IN D5W 750 MG/150ML IV SOLN
750.0000 mg | INTRAVENOUS | Status: DC
  Filled 2013-04-29: qty 150

## 2013-04-29 MED ORDER — HYDROCODONE-ACETAMINOPHEN 5-325 MG PO TABS
1.0000 | ORAL_TABLET | Freq: Two times a day (BID) | ORAL | Status: DC | PRN
  Administered 2013-04-29: 1 via ORAL
  Filled 2013-04-29: qty 1

## 2013-04-29 MED ORDER — SPIRONOLACTONE 50 MG PO TABS
50.0000 mg | ORAL_TABLET | Freq: Every day | ORAL | Status: DC
  Administered 2013-04-29 – 2013-04-30 (×2): 50 mg via ORAL
  Filled 2013-04-29 (×2): qty 1

## 2013-04-29 MED ORDER — PANTOPRAZOLE SODIUM 40 MG PO TBEC
40.0000 mg | DELAYED_RELEASE_TABLET | Freq: Every day | ORAL | Status: DC
  Administered 2013-04-29 – 2013-04-30 (×2): 40 mg via ORAL
  Filled 2013-04-29 (×2): qty 1

## 2013-04-29 MED ORDER — HYDROCODONE-ACETAMINOPHEN 5-325 MG PO TABS
1.0000 | ORAL_TABLET | ORAL | Status: DC | PRN
  Administered 2013-04-29 (×2): 2 via ORAL
  Administered 2013-04-29: 1 via ORAL
  Administered 2013-04-30: 2 via ORAL
  Filled 2013-04-29: qty 2
  Filled 2013-04-29: qty 1
  Filled 2013-04-29 (×2): qty 2

## 2013-04-29 MED ORDER — VANCOMYCIN HCL IN DEXTROSE 1-5 GM/200ML-% IV SOLN
1000.0000 mg | Freq: Once | INTRAVENOUS | Status: AC
  Administered 2013-04-29: 1000 mg via INTRAVENOUS
  Filled 2013-04-29: qty 200

## 2013-04-29 MED ORDER — ALUM & MAG HYDROXIDE-SIMETH 200-200-20 MG/5ML PO SUSP: 15.0000 mL | ORAL | Status: DC | PRN

## 2013-04-29 MED ORDER — ACETAMINOPHEN 650 MG RE SUPP: 650.0000 mg | Freq: Four times a day (QID) | RECTAL | Status: DC | PRN

## 2013-04-29 MED ORDER — ONDANSETRON HCL 4 MG/2ML IJ SOLN: 4.0000 mg | Freq: Four times a day (QID) | INTRAMUSCULAR | Status: DC | PRN

## 2013-04-29 MED ORDER — MORPHINE SULFATE 2 MG/ML IJ SOLN
2.0000 mg | Freq: Once | INTRAMUSCULAR | Status: AC
  Administered 2013-04-29: 2 mg via INTRAVENOUS
  Filled 2013-04-29: qty 1

## 2013-04-29 MED ORDER — RISAQUAD PO CAPS
1.0000 | ORAL_CAPSULE | Freq: Every day | ORAL | Status: DC
  Administered 2013-04-29 – 2013-04-30 (×2): 1 via ORAL
  Filled 2013-04-29 (×2): qty 1

## 2013-04-29 MED ORDER — POTASSIUM CHLORIDE CRYS ER 20 MEQ PO TBCR
20.0000 meq | EXTENDED_RELEASE_TABLET | Freq: Two times a day (BID) | ORAL | Status: DC
  Administered 2013-04-29 – 2013-04-30 (×3): 20 meq via ORAL
  Filled 2013-04-29 (×4): qty 1

## 2013-04-29 MED ORDER — SODIUM CHLORIDE 0.9 % IJ SOLN
3.0000 mL | Freq: Two times a day (BID) | INTRAMUSCULAR | Status: DC
  Administered 2013-04-29 (×2): 3 mL via INTRAVENOUS

## 2013-04-29 MED ORDER — ADULT MULTIVITAMIN W/MINERALS CH
1.0000 | ORAL_TABLET | Freq: Every day | ORAL | Status: DC
  Administered 2013-04-29 – 2013-04-30 (×2): 1 via ORAL
  Filled 2013-04-29 (×2): qty 1

## 2013-04-29 MED ORDER — METHOCARBAMOL 750 MG PO TABS
1500.0000 mg | ORAL_TABLET | Freq: Three times a day (TID) | ORAL | Status: DC | PRN
  Administered 2013-04-29 – 2013-04-30 (×4): 1500 mg via ORAL
  Filled 2013-04-29 (×4): qty 2

## 2013-04-29 MED ORDER — DEXTROSE 5 % IV SOLN
1.0000 g | Freq: Once | INTRAVENOUS | Status: AC
  Administered 2013-04-29: 1 g via INTRAVENOUS
  Filled 2013-04-29: qty 10

## 2013-04-29 MED ORDER — SODIUM CHLORIDE 0.9 % IJ SOLN
3.0000 mL | Freq: Two times a day (BID) | INTRAMUSCULAR | Status: DC
  Administered 2013-04-29: 3 mL via INTRAVENOUS

## 2013-04-29 MED ORDER — ONDANSETRON HCL 4 MG PO TABS: 4.0000 mg | ORAL_TABLET | Freq: Four times a day (QID) | ORAL | Status: DC | PRN

## 2013-04-29 MED ORDER — ENOXAPARIN SODIUM 40 MG/0.4ML ~~LOC~~ SOLN
40.0000 mg | SUBCUTANEOUS | Status: DC
  Administered 2013-04-29: 40 mg via SUBCUTANEOUS
  Filled 2013-04-29 (×2): qty 0.4

## 2013-04-29 MED ORDER — DICYCLOMINE HCL 10 MG/5ML PO SOLN
10.0000 mg | Freq: Three times a day (TID) | ORAL | Status: DC
  Administered 2013-04-29 – 2013-04-30 (×3): 10 mg via ORAL
  Filled 2013-04-29 (×9): qty 5

## 2013-04-29 MED ORDER — DEXTROSE 5 % IV SOLN: 500.0000 mg | Freq: Once | INTRAVENOUS | Status: DC

## 2013-04-29 MED ORDER — CYCLOBENZAPRINE HCL 10 MG PO TABS
5.0000 mg | ORAL_TABLET | Freq: Three times a day (TID) | ORAL | Status: DC | PRN
  Administered 2013-04-29: 5 mg via ORAL
  Filled 2013-04-29: qty 1

## 2013-04-29 MED ORDER — ACETAMINOPHEN 325 MG PO TABS: 650.0000 mg | ORAL_TABLET | Freq: Four times a day (QID) | ORAL | Status: DC | PRN

## 2013-04-29 NOTE — Progress Notes (Addendum)
ANTIBIOTIC CONSULT NOTE - INITIAL  Pharmacy Consult for vancomycin, levaquin and cefepime Indication: pneumonia  Allergies  Allergen Reactions  . Aspirin Other (See Comments)    Cramps her stomach  . Sulfa Antibiotics Nausea And Vomiting    Patient Measurements: Weight: 141 lb 1.5 oz (64 kg) Adjusted Body Weight:   Vital Signs: Temp: 98.7 F (37.1 C) (05/24 0452) Temp src: Oral (05/24 0452) BP: 108/63 mmHg (05/24 0452) Pulse Rate: 92 (05/24 0452) Intake/Output from previous day:   Intake/Output from this shift:    Labs:  Recent Labs  04/28/13 2305  WBC 20.7*  HGB 10.6*  PLT 269  CREATININE 1.27*   The CrCl is unknown because both a height and weight (above a minimum accepted value) are required for this calculation. No results found for this basename: VANCOTROUGH, VANCOPEAK, VANCORANDOM, GENTTROUGH, GENTPEAK, GENTRANDOM, TOBRATROUGH, TOBRAPEAK, TOBRARND, AMIKACINPEAK, AMIKACINTROU, AMIKACIN,  in the last 72 hours   Microbiology: No results found for this or any previous visit (from the past 720 hour(s)).  Medical History: Past Medical History  Diagnosis Date  . Crohn's disease   . Chronic diarrhea   . Joint pain   . GERD (gastroesophageal reflux disease)   . Polyarthralgia   . Shortness of breath   . Pneumonia   . Anemia   . Hernia (acquired) (recurrent)     hiatal  . Hiatal hernia   . Back pain   . Hemorrhoids   . Anemia     Medications:  Prescriptions prior to admission  Medication Sig Dispense Refill  . albuterol (PROVENTIL HFA;VENTOLIN HFA) 108 (90 BASE) MCG/ACT inhaler Inhale 2 puffs into the lungs every 6 (six) hours as needed for wheezing.  1 Inhaler  2  . bifidobacterium infantis (ALIGN) capsule Take 1 capsule by mouth daily.  30 capsule  5  . Calcium Carbonate-Vitamin D (CALCIUM + D PO) Take 1 tablet by mouth daily.      . Cyanocobalamin (VITAMIN B-12 IJ) Inject 1 each as directed every 30 (thirty) days.      [name] dicyclomine (BENTYL) 10  MG/5ML syrup Take 5 mLs (10 mg total) by mouth 4 (four) times daily -  before meals and at bedtime.  600 mL  2  . HYDROcodone-acetaminophen (NORCO/VICODIN) 5-325 MG per tablet Take 1 tablet by mouth 2 (two) times daily as needed for pain.      . Multiple Vitamin (MULTIVITAMIN WITH MINERALS) TABS Take 1 tablet by mouth daily.      . pantoprazole (PROTONIX) 40 MG tablet Take 1 tablet (40 mg total) by mouth daily.  30 tablet  5  . potassium chloride SA (K-DUR,KLOR-CON) 20 MEQ tablet Take 1 tablet (20 mEq total) by mouth 2 (two) times daily.  30 tablet  2  . spironolactone (ALDACTONE) 50 MG tablet Take 50 mg by mouth daily.        Assessment: HCAP   Goal of Therapy:  Vancomycin trough level 15-20 mcg/ml  Plan:  levaquin 750 mg iv q24h. Cefepime 1gm q12h, vancomycin 1gm x1 9given in ED) then 500 mg q12h --next dose at 1800   Janice Coffin 04/29/2013,5:41 AM

## 2013-04-29 NOTE — H&P (Signed)
[facility] History and Physical  Danielle [name]:[id number] DOB: 12-Nov-1946 DOA: 04/29/2013  Referring physician: ER physician. PCP: [name], MD  Specialists: Dr. Karilyn Cota gastroenterologist.  Chief Complaint: Shortness of breath and chest pain.  HPI: Danielle [name] is a 67 y.o. female with known history of Crohn's disease on Remicade started to experience chest pain with shortness of breath yesterday. Chest pain is retrosternal pressure-like radiating to the back. Shortness of breath was present even at rest. Denies any productive cough fever chills or any palpitations. Patient since the symptoms were persistent came to the ER. In the ER EKG cardiac enzymes were negative chest x-ray was showing pneumonic process and has been admitted for further management. In addition over the last few days patient has been having low back pain. Patient recently was placed on steroids after patient had skin reaction after patient took magnesium. Patient was admitted for pneumonia this March.  Review of Systems: As presented in the history of presenting illness, rest negative.  Past Medical History  Diagnosis Date  . Crohn's disease   . Chronic diarrhea   . Joint pain   . GERD (gastroesophageal reflux disease)   . Polyarthralgia   . Shortness of breath   . Pneumonia   . Anemia   . Hernia (acquired) (recurrent)     hiatal  . Hiatal hernia   . Back pain   . Hemorrhoids   . Anemia    Past Surgical History  Procedure Laterality Date  . Bowel resection    . Colostomy      [name]  . Anal stenosis    . Colonoscopy  2000    needs to have one  . Cholecystectomy    . Appendectomy    . Goiter  1999  . Esophagogastroduodenoscopy  11/11/2011    Procedure: ESOPHAGOGASTRODUODENOSCOPY (EGD);  Surgeon: Barrie Folk, MD;  Location: [facility] ENDOSCOPY;  Service: Endoscopy;  Laterality: N/A;  . Flexible sigmoidoscopy  11/11/2011    Procedure: FLEXIBLE SIGMOIDOSCOPY;  Surgeon: Barrie Folk, MD;   Location: [facility] ENDOSCOPY;  Service: Endoscopy;  Laterality: N/A;  . Esophagogastroduodenoscopy  05/18/2012    Procedure: ESOPHAGOGASTRODUODENOSCOPY (EGD);  Surgeon: Malissa Hippo, MD;  Location: [facility] ENDO SUITE;  Service: Endoscopy;  Laterality: N/A;  200   Social History:  reports that she quit smoking about 16 years ago. Her smoking use included Cigarettes. She has a 1 pack-year smoking history. She has never used smokeless tobacco. She reports that she does not drink alcohol or use illicit drugs. Lives at home. where does patient live-- Can do ADLs. Can patient participate in ADLs?  Allergies  Allergen Reactions  . Aspirin Other (See Comments)    Cramps her stomach  . Sulfa Antibiotics Nausea And Vomiting    Family History  Problem Relation Age of Onset  . Diabetes Mother   . Healthy Sister   . Hypertension Brother   . Colon cancer Brother     died with colon cancer      Prior to Admission medications   Medication Sig Start Date End Date Taking? Authorizing Provider  albuterol (PROVENTIL HFA;VENTOLIN HFA) 108 (90 BASE) MCG/ACT inhaler Inhale 2 puffs into the lungs every 6 (six) hours as needed for wheezing. 02/27/13  Yes Nishant Dhungel, MD  bifidobacterium infantis (ALIGN) capsule Take 1 capsule by mouth daily. 06/28/12 06/28/13 Yes Malissa Hippo, MD  Calcium Carbonate-Vitamin D (CALCIUM + D PO) Take 1 tablet by mouth daily.   Yes Historical Provider, MD  Cyanocobalamin (VITAMIN B-12 IJ) Inject 1 each as directed every 30 (thirty) days.   Yes Historical Provider, MD  dicyclomine (BENTYL) 10 MG/5ML syrup Take 5 mLs (10 mg total) by mouth 4 (four) times daily -  before meals and at bedtime. 04/04/13  Yes Malissa Hippo, MD  HYDROcodone-acetaminophen (NORCO/VICODIN) 5-325 MG per tablet Take 1 tablet by mouth 2 (two) times daily as needed for pain.   Yes Historical Provider, MD  Multiple Vitamin (MULTIVITAMIN WITH MINERALS) TABS Take 1 tablet by mouth daily.   Yes Historical Provider, MD   pantoprazole (PROTONIX) 40 MG tablet Take 1 tablet (40 mg total) by mouth daily. 12/18/11  Yes Alison Murray, MD  potassium chloride SA (K-DUR,KLOR-CON) 20 MEQ tablet Take 1 tablet (20 mEq total) by mouth 2 (two) times daily. 02/27/13  Yes Nishant Dhungel, MD  spironolactone (ALDACTONE) 50 MG tablet Take 50 mg by mouth daily.  01/10/13  Yes Historical Provider, MD   Physical Exam: Filed Vitals:   04/29/13 0045 04/29/13 0100 04/29/13 0115 04/29/13 0130  BP: 113/72 118/62 110/66 118/73  Pulse: 100 100 98 102  Temp:      TempSrc:      Resp: 19 22 25 25   SpO2: 99% 100% 99% 97%     General:  Well-developed well-nourished.  Eyes: Anicteric no pallor.  ENT: No discharge from the ascites nose mouth.  Neck: No mass felt.  Cardiovascular: S1-S2 heard.  Respiratory: No rhonchi or crepitations.  Abdomen: Soft nontender bowel sounds present.  Skin: No rash.  Musculoskeletal: Mild lower extremity swelling.  Psychiatric: Appears normal.  Neurologic: Alert awake oriented to time place and person. Moves all extremities.  Labs on Admission:  Basic Metabolic Panel:  Recent Labs Lab 04/28/13 2305  NA 137  K 3.9  CL 104  CO2 20  GLUCOSE 91  BUN 20  CREATININE 1.27*  CALCIUM 9.5   Liver Function Tests: No results found for this basename: AST, ALT, ALKPHOS, BILITOT, PROT, ALBUMIN,  in the last 168 hours No results found for this basename: LIPASE, AMYLASE,  in the last 168 hours No results found for this basename: AMMONIA,  in the last 168 hours CBC:  Recent Labs Lab 04/28/13 2305  WBC 20.7*  HGB 10.6*  HCT 33.0*  MCV 92.7  PLT 269   Cardiac Enzymes: No results found for this basename: CKTOTAL, CKMB, CKMBINDEX, TROPONINI,  in the last 168 hours  BNP (last 3 results)  Recent Labs  02/24/13 1625 04/28/13 2305  PROBNP 511.1* 215.7*   CBG: No results found for this basename: GLUCAP,  in the last 168 hours  Radiological Exams on Admission: Dg Chest 2  View  04/29/2013   *RADIOLOGY REPORT*  Clinical Data: Chest pain and shortness of breath; lower back pain.  CHEST - 2 VIEW  Comparison: Chest radiograph from 03/21 to S 14  Findings: The lungs are well-aerated.  New right basilar airspace opacification raises concern for mild pneumonia.  No definite pleural effusion or pneumothorax is seen.  There may also be slight left basilar airspace opacity.  The heart is normal in size; a large hiatal hernia is seen, predominantly filled with air.  No acute osseous abnormalities are seen.  Postoperative change is noted overlying the superior mediastinum.  Clips are noted within the right upper quadrant, reflecting prior cholecystectomy.  IMPRESSION:  1.  New right basilar airspace opacification, concerning for mild pneumonia; question of mild left basilar airspace opacity. 2.  Large hiatal hernia seen.  Original Report Authenticated By: Tonia Ghent, M.D.    EKG: Independently reviewed. Normal sinus rhythm.  Assessment/Plan Principal Problem:   PNA (pneumonia) Active Problems:   Crohn's disease   Chest pain   Low back pain   1. Health care associated pneumonia - since patient was just admitted patient's pneumonia will be treated as health care associated. Patient has been placed on vancomycin cefepime and Levaquin. Due to recurrence of pneumonia I have ordered CT chest since patient also has previous history of cigarette smoking. 2. Chest pain - cycle cardiac markers. 3. Lower extremity edema - check Dopplers to rule out DVT. 4. Significant leukocytosis - probably secondary to recent use of steroids. Follow blood cultures. 5. Chronic anemia - follow CBC. 6. Low back pain - I have ordered x-ray of the T-spine and L-spine. If they are negative and pain persists consider MRI. 7. History of Crohn's disease on Remicade.    Code Status: Full code.  Family Communication: None.  Disposition Plan: Admit for inpatient.    Chloey Ricard N. [facility] Pager [phone].  If 7PM-7AM, please contact night-coverage [email or web address] Password [facility] 04/29/2013, 4:05 AM

## 2013-04-29 NOTE — ED Notes (Signed)
IV attempted x1, pt states IV team is usually called, this RN paged them.

## 2013-04-29 NOTE — ED Provider Notes (Signed)
History     CSN: [id number]  Arrival date & time 04/28/13  2248   First MD Initiated Contact with Patient 04/29/13 0041      Chief Complaint  Patient presents with  . Chest Pain  . Shortness of Breath    (Consider location/radiation/quality/duration/timing/severity/associated sxs/prior treatment) Patient is a 67 y.o. female presenting with chest pain and shortness of breath.  Chest Pain Associated symptoms: shortness of breath   Shortness of Breath Associated symptoms: chest pain    patient denies cough and fever. He feels like her symptoms have worsened for a day. Pain is aching, diffuse, more significant on the right side. Worse with deep inspiration. Past Medical History  Diagnosis Date  . Crohn's disease   . Chronic diarrhea   . Joint pain   . GERD (gastroesophageal reflux disease)   . Polyarthralgia   . Shortness of breath   . Pneumonia   . Anemia   . Hernia (acquired) (recurrent)     hiatal  . Hiatal hernia   . Back pain   . Hemorrhoids   . Anemia     Past Surgical History  Procedure Laterality Date  . Bowel resection    . Colostomy      [name]  . Anal stenosis    . Colonoscopy  2000    needs to have one  . Cholecystectomy    . Appendectomy    . Goiter  1999  . Esophagogastroduodenoscopy  11/11/2011    Procedure: ESOPHAGOGASTRODUODENOSCOPY (EGD);  Surgeon: Barrie Folk, MD;  Location: [facility] ENDOSCOPY;  Service: Endoscopy;  Laterality: N/A;  . Flexible sigmoidoscopy  11/11/2011    Procedure: FLEXIBLE SIGMOIDOSCOPY;  Surgeon: Barrie Folk, MD;  Location: [facility] ENDOSCOPY;  Service: Endoscopy;  Laterality: N/A;  . Esophagogastroduodenoscopy  05/18/2012    Procedure: ESOPHAGOGASTRODUODENOSCOPY (EGD);  Surgeon: Malissa Hippo, MD;  Location: [facility] ENDO SUITE;  Service: Endoscopy;  Laterality: N/A;  200    Family History  Problem Relation Age of Onset  . Diabetes Mother   . Healthy Sister   . Hypertension Brother   . Colon cancer Brother     died with colon  cancer    History  Substance Use Topics  . Smoking status: Former Smoker -- 0.25 packs/day for 4 years    Types: Cigarettes    Quit date: 10/05/1996  . Smokeless tobacco: Never Used     Comment: 1 pack every two weeks when she smoked  . Alcohol Use: No     Comment: denies    OB History   Grav Para Term Preterm Abortions TAB SAB Ect Mult Living                  Review of Systems  Respiratory: Positive for shortness of breath.   Cardiovascular: Positive for chest pain.   Gen: no weight loss, fevers, chills, night sweats Eyes: no discharge or drainage, no occular pain or visual changes Nose: no epistaxis or rhinorrhea Mouth: no dental pain, no sore throat Neck: no neck pain Lungs: no SOB, cough, wheezing CV: no chest pain, palpitations, dependent edema or orthopnea Abd: no abdominal pain, nausea, vomiting GU: no dysuria or gross hematuria MSK: no myalgias or arthralgias Neuro: no headache, no focal neurologic deficits Skin: no rash Psyche: negative.  Allergies  Aspirin and Sulfa antibiotics  Home Medications   Current Outpatient Rx  Name  Route  Sig  Dispense  Refill  . albuterol (PROVENTIL HFA;VENTOLIN HFA) 108 (90 BASE) MCG/ACT inhaler  Inhalation   Inhale 2 puffs into the lungs every 6 (six) hours as needed for wheezing.   1 Inhaler   2   . bifidobacterium infantis (ALIGN) capsule   Oral   Take 1 capsule by mouth daily.   30 capsule   5   . Calcium Carbonate-Vitamin D (CALCIUM + D PO)   Oral   Take 1 tablet by mouth daily.         . Cyanocobalamin (VITAMIN B-12 IJ)   Injection   Inject 1 each as directed every 30 (thirty) days.         [name] dicyclomine (BENTYL) 10 MG/5ML syrup   Oral   Take 5 mLs (10 mg total) by mouth 4 (four) times daily -  before meals and at bedtime.   600 mL   2   . HYDROcodone-acetaminophen (NORCO/VICODIN) 5-325 MG per tablet   Oral   Take 1 tablet by mouth 2 (two) times daily as needed for pain.         . Multiple  Vitamin (MULTIVITAMIN WITH MINERALS) TABS   Oral   Take 1 tablet by mouth daily.         . pantoprazole (PROTONIX) 40 MG tablet   Oral   Take 1 tablet (40 mg total) by mouth daily.   30 tablet   5   . potassium chloride SA (K-DUR,KLOR-CON) 20 MEQ tablet   Oral   Take 1 tablet (20 mEq total) by mouth 2 (two) times daily.   30 tablet   2   . spironolactone (ALDACTONE) 50 MG tablet   Oral   Take 50 mg by mouth daily.            BP 115/71  Pulse 101  Temp(Src) 99.4 F (37.4 C) (Oral)  Resp 14  SpO2 100%  Physical Exam Gen: well developed and well nourished appearing Head: NCAT Eyes: PERL, EOMI Nose: no epistaixis or rhinorrhea Mouth/throat: mucosa is moist and pink Neck: supple, no stridor Lungs: CTA B, no wheezing, rhonchi or rales Abd: soft, notender, nondistended Back: no ttp, no cva ttp Skin: no rashese, wnl Neuro: CN ii-xii grossly intact, no focal deficits Psyche; normal affect,  calm and cooperative.   ED Course  Procedures (including critical care time)  Labs Reviewed  CBC - Abnormal; Notable for the following:    WBC 20.7 (*)    RBC 3.56 (*)    Hemoglobin 10.6 (*)    HCT 33.0 (*)    All other components within normal limits  BASIC METABOLIC PANEL - Abnormal; Notable for the following:    Creatinine, Ser 1.27 (*)    GFR calc non Af Amer 43 (*)    GFR calc Af Amer 50 (*)    All other components within normal limits  PRO B NATRIURETIC PEPTIDE - Abnormal; Notable for the following:    Pro B Natriuretic peptide (BNP) 215.7 (*)    All other components within normal limits  CULTURE, BLOOD (ROUTINE X 2)  CULTURE, BLOOD (ROUTINE X 2)  POCT I-STAT TROPONIN I   Dg Chest 2 View  04/29/2013   *RADIOLOGY REPORT*  Clinical Data: Chest pain and shortness of breath; lower back pain.  CHEST - 2 VIEW  Comparison: Chest radiograph from 03/21 to S 14  Findings: The lungs are well-aerated.  New right basilar airspace opacification raises concern for mild  pneumonia.  No definite pleural effusion or pneumothorax is seen.  There may also be slight left basilar airspace  opacity.  The heart is normal in size; a large hiatal hernia is seen, predominantly filled with air.  No acute osseous abnormalities are seen.  Postoperative change is noted overlying the superior mediastinum.  Clips are noted within the right upper quadrant, reflecting prior cholecystectomy.  IMPRESSION:  1.  New right basilar airspace opacification, concerning for mild pneumonia; question of mild left basilar airspace opacity. 2.  Large hiatal hernia seen.   Original Report Authenticated By: Tonia Ghent, M.D.       MDM  MS. Martie Round has a right lower lobe infiltrates/pneumonia and sepsis. We are treating her for community-acquired pneumonia. Case discussed with hospitalist who will see and evaluate for admission.        Brandt Loosen, MD 05/06/13 [phone]

## 2013-04-29 NOTE — Progress Notes (Signed)
[facility] PROGRESS NOTE  Danielle [name] JWJ:[id number] DOB: 09/29/1946 DOA: 04/29/2013 PCP: [name], MD  Assessment/Plan  Atelectasis.   -  CT chest demonstrates only atelectasis and patient has no symptoms of PNA -  D/c vancomycin and cefepime and levofloxacin -  IS -  Follow up blood cultures   Chest pain, sounds possible GERD or GI related - troponins pending -  Telemetry:  Sinus tachycardia with increasing HR over last few hours -  Continue PPI -  Add maalox prn  Lower extremity edema - duplex pending. 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  Chronic anemia -  Trend hgb -  tx for hgb < 7 or hemodynamic instability with signs of active bleeding  History of Crohn's disease on Remicade.  Diet:  Healthy heart Access:  PIV IVF:  none Proph:  lovenox  Code Status: full code Family Communication: spoke with patient alone Disposition Plan: pending completion of evaluation listed above.  Likely to home tomorrow.     Consultants:  none  Procedures:  CT chest   CXR  Lumbar and thoracic X-ray  ECHO  Duplex lower extremities  Antibiotics:  Vancomycin 5/24  Cefepime 5/24  Levofloxacin 5/24   HPI/Subjective:  Denies fevers, chills.  Denies chest pain and shortness of breath and cough and wheeze.  Denies nausea, vomiting, diarrhea, constipation.  Has been eating well.  Has been out of bed walking to the bathroom.  Has severe low back pain with  muscle spasms.  States that her IV is hurting and she would like a PICC line.  She requests IV pain mediaction    Objective: Filed Vitals:   04/29/13 0230 04/29/13 0300 04/29/13 0452 04/29/13 0500  BP: 108/53 112/71 108/63   Pulse: 95 98 92   Temp:   98.7 F (37.1 C)   TempSrc:   Oral   Resp: 20 21 18    Weight:    64 kg (141 lb 1.5 oz)  SpO2: 99% 99% 99%    No intake or output data in the 24 hours ending 04/29/13 0838 Filed Weights   04/29/13 0500  Weight: 64 kg (141 lb 1.5 oz)    Exam:   General:  AAF, No acute distress  HEENT:  NCAT, MMM  Cardiovascular:  RRR, nl S1, S2 no mrg, 2+ pulses, warm extremities  Respiratory:  CTAB, no increased WOB  Abdomen:   NABS, soft, NT/ND  MSK:   Normal tone and bulk, 1+ LEE.  TTP over the low back with palpable muscle spasms  Neuro:  Grossly intact  Data Reviewed: Basic Metabolic Panel:  Recent Labs Lab 04/28/13 2305 04/29/13 0621  NA 137 136  K 3.9 4.3  CL 104 105  CO2 20 17*  GLUCOSE 91 93  BUN 20 17  CREATININE 1.27* 1.26*  CALCIUM 9.5 9.0   Liver Function Tests:  Recent Labs Lab 04/29/13 0621  AST 15  ALT 17  ALKPHOS 100  BILITOT 0.2*  PROT 7.2  ALBUMIN 3.0*   No results found for this basename: LIPASE, AMYLASE,  in the last 168 hours No results found for this basename: AMMONIA,  in the last 168 hours CBC:  Recent Labs Lab 04/28/13 2305 04/29/13 0621  WBC 20.7* 19.0*  NEUTROABS  --  12.9*  HGB 10.6* 11.1*  HCT 33.0* 34.7*  MCV 92.7 92.5  PLT 269 257   Cardiac Enzymes: No results found for this basename: CKTOTAL, CKMB, CKMBINDEX, TROPONINI,  in the last 168 hours BNP (last 3 results)  Recent Labs  02/24/13 1625 04/28/13 2305  PROBNP 511.1* 215.7*   CBG: No results found for this basename: GLUCAP,  in the last 168 hours  No results found for this or any previous visit (from the past 240 hour(s)).   Studies: Dg Chest 2 View  04/29/2013   *RADIOLOGY REPORT*  Clinical Data: Chest pain  and shortness of breath; lower back pain.  CHEST - 2 VIEW  Comparison: Chest radiograph from 03/21 to S 14  Findings: The lungs are well-aerated.  New right basilar airspace opacification raises concern for mild pneumonia.  No definite pleural effusion or pneumothorax is seen.  There may also be slight left basilar airspace opacity.  The heart is normal in size; a large hiatal hernia is seen, predominantly filled with air.  No acute osseous abnormalities are seen.  Postoperative change is noted overlying the superior mediastinum.  Clips are noted within the right upper quadrant, reflecting prior cholecystectomy.  IMPRESSION:  1.  New right basilar airspace opacification, concerning for mild pneumonia; question of mild left basilar airspace opacity. 2.  Large hiatal hernia seen.   Original Report Authenticated By: Tonia Ghent, M.D.   Dg Thoracic Spine 2 View  04/29/2013   *RADIOLOGY REPORT*  Clinical Data: Acute onset of back pain after working in the yard yesterday.  THORACIC SPINE - 2 VIEW  Comparison: None.  Findings: 12 rib-bearing thoracic vertebrae with anatomic alignment.  No fractures.  Mild mid thoracic spondylosis. Generalized osseous demineralization.  Intact pedicles.  IMPRESSION: No acute osseous abnormality.  Mild mid thoracic spondylosis. Generalized osseous demineralization.   Original Report Authenticated By: Hulan Saas, M.D.   Dg Lumbar Spine Complete  04/29/2013   *RADIOLOGY REPORT*  Clinical Data: Acute onset of back pain after working in the ER yesterday.  LUMBAR SPINE - COMPLETE 4+ VIEW  Comparison: Bone window images from CT abdomen and pelvis 12/26/2012.  Findings: Five non-rib bearing lumbar vertebrae with anatomic alignment.  Mild compression fracture of the anterior L1 vertebral body, 20-30% or so, new since the prior CT.  No other fractures. Generalized osseous demineralization. Well-preserved disc spaces. No pars defects.  No significant facet arthropathy.  Visualized  sacroiliac joints intact.  IMPRESSION: Mild compression fracture of the anterior L1 vertebral body on the order of 20-30% or so, new since January, 2014.  This could certainly be acute.  Generalized osseous demineralization.   Original Report Authenticated By: Hulan Saas, M.D.    Scheduled Meds: . acidophilus  1 capsule Oral Daily  . azithromycin (ZITHROMAX) 500 MG IVPB  500 mg Intravenous Once  . ceFEPime (MAXIPIME) IV  1 g Intravenous Q12H  . dicyclomine  10 mg Oral TID AC & HS  . enoxaparin (LOVENOX) injection  40 mg Subcutaneous Q24H  . multivitamin with minerals  1 tablet Oral Daily  . pantoprazole  40 mg Oral Daily  . potassium chloride SA  20 mEq Oral  BID  . sodium chloride  3 mL Intravenous Q12H  . sodium chloride  3 mL Intravenous Q12H  . spironolactone  50 mg Oral Daily  . vancomycin  500 mg Intravenous Q12H   Continuous Infusions:   Principal Problem:   PNA (pneumonia) Active Problems:   Crohn's disease   Chest pain   Low back pain    Time spent: 30 min    Tanya Marvin, [facility]  [facility] Pager [phone]. If 7PM-7AM, please contact night-coverage at [email or web address], password [facility] 04/29/2013, 8:38 AM  LOS: 0 days

## 2013-04-29 NOTE — Progress Notes (Signed)
Pt given Robaxen at this time per pt request; will cont. To monitor.

## 2013-04-29 NOTE — Progress Notes (Signed)
VASCULAR LAB PRELIMINARY  PRELIMINARY  PRELIMINARY  PRELIMINARY  Bilateral lower extremity venous Dopplers completed.    Preliminary report:  There is no DVT or SVT noted in the bilateral lower extremities.  Vernice Mannina, RVT 04/29/2013, 11:42 AM

## 2013-04-29 NOTE — Progress Notes (Signed)
Utilization review completed.  P.J. Gleen Ripberger,RN,BSN Case Manager [phone]  

## 2013-04-30 DIAGNOSIS — T148XXA Other injury of unspecified body region, initial encounter: Secondary | ICD-10-CM

## 2013-04-30 DIAGNOSIS — J9819 Other pulmonary collapse: Principal | ICD-10-CM

## 2013-04-30 DIAGNOSIS — D72829 Elevated white blood cell count, unspecified: Secondary | ICD-10-CM

## 2013-04-30 DIAGNOSIS — IMO0002 Reserved for concepts with insufficient information to code with codable children: Secondary | ICD-10-CM

## 2013-04-30 MED ORDER — TIOCONAZOLE 6.5 % VA OINT: 1.0000 | TOPICAL_OINTMENT | Freq: Once | VAGINAL | Status: DC

## 2013-04-30 MED ORDER — METHOCARBAMOL 750 MG PO TABS: 1500.0000 mg | ORAL_TABLET | Freq: Three times a day (TID) | ORAL | Status: DC | PRN

## 2013-04-30 MED ORDER — HYDROCODONE-ACETAMINOPHEN 5-325 MG PO TABS: 1.0000 | ORAL_TABLET | Freq: Two times a day (BID) | ORAL | Status: DC | PRN

## 2013-04-30 NOTE — Progress Notes (Signed)
Pt given Robaxin at this time per request; will cont. To monitor.

## 2013-04-30 NOTE — Discharge Summary (Addendum)
Physician Discharge Summary  Danielle [name] ZOX:[id number] DOB: 08-10-1946 DOA: 04/29/2013  PCP: [name], MD  Admit date: 04/29/2013 Discharge date: 04/30/2013  Recommendations for Outpatient Follow-up:  1. Home health physical therapy  2. Follow up with primary care doctor within one week of discharge.  Referral for kyphoplasty if not improving with conservative measures.  Bone density evaluation/initiation of treatment for osteoporosis per PCP.  Consider ECHO for further evaluation of lower extremity edema.  Repeat BMP and CBC to follow up leukocytosis and anemia and chronic kidney disease.  Follow up pending blood cultures.    Discharge Diagnoses:  Principal Problem:   Atelectasis Active Problems:   Crohn's disease   Chest pain   Low back pain   Leukocytosis, unspecified   Compression fracture of L1 lumbar vertebra   Discharge Condition: Stable, improved  Diet recommendation: Healthy heart  Wt Readings from Last 3 Encounters:  04/30/13 70.1 kg (154 lb 8.7 oz)  04/11/13 64 kg (141 lb 1.5 oz)  03/28/13 60.782 kg (134 lb)    History of present illness:   Danielle [name] is a 67 y.o. female with known history of Crohn's disease on Remicade started to experience chest pain with shortness of breath yesterday. Chest pain is retrosternal pressure-like radiating to the back. Shortness of breath was present even at rest. Denies any productive cough fever chills or any palpitations. Patient since the symptoms were persistent came to the ER. In the ER EKG cardiac enzymes were negative chest x-ray was showing pneumonic process and has been admitted for further management. In addition over the last few days patient has been having low back pain. Patient recently was placed on steroids after patient had skin reaction after patient took magnesium. Patient was admitted for pneumonia this March.   Hospital Course:   Danielle [name] was admitted with presumed healthcare associated pneumonia.  She was started on broad-spectrum antibiotics with vancomycin, cefepime, and levofloxacin.  He underwent a CT of the chest which demonstrated only atelectasis. As she was afebrile and did not have symptoms of pneumonia such as cough or shortness of breath, her antibiotics were discontinued.  She most likely has some atelectasis, which can be treated with incentive spirometry. She has blood cultures which are pending at the time of discharge which will be followed up by her primary care doctor.    Chest pain was atypical.  Her telemetry demonstrated sinus tachycardia. Her troponins were negative. Her chest pain sounded like it might be related to gastritis or GERD, so she was continued on a PPI and had Maalox as needed.  She should talk to her primary care doctor about an outpatient stress test and echo.  Lower extremity edema.  Her albumin was only mildly low. Her lower extremity duplex was negative. Her proBNP was 215, previously it was 511. She should undergo an outpatient ultrasound of the heart.   Her low back pain she underwent x-rays of the thoracic and lumbar spines. He was found to have an L1 compression fracture 20-30%.  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  She did not have symptoms of nerve root compression on exam.  She was able to ambulate but had some pain. She should undergo outpatient physical therapy.  He was started on Robaxin and and encouraged to use heating pads. Did discuss the pros and cons of kyphoplasty, and she will wait to see if there is improvement with conservative therapies.   Significant leukocytosis, possible due to steroids  or infection.  Her white blood cell count remained around 20.  She did not have symptoms of urinary tract infection. She did not have symptoms of pneumonia, other than findings on her chest x-ray. Her blood cultures are negative at the time of discharge   Chronic anemia.    History of Crohn's disease on  Remicade.   Consultants:  none Procedures:  CT chest  CXR  Lumbar and thoracic X-ray  ECHO  Duplex lower extremities Antibiotics:  Vancomycin 5/24  Cefepime 5/24  Levofloxacin 5/24    Discharge Exam: Filed Vitals:   04/30/13 0512  BP: 123/70  Pulse: 93  Temp: 98.5 F (36.9 C)  Resp: 18   Filed Vitals:   04/29/13 0500 04/29/13 1500 04/29/13 1959 04/30/13 0512  BP:  115/69 119/75 123/70  Pulse:  100 104 93  Temp:  98.4 F (36.9 C) 98.8 F (37.1 C) 98.5 F (36.9 C)  TempSrc:  Oral Oral Oral  Resp:  16 18 18   Weight: 64 kg (141 lb 1.5 oz)   70.1 kg (154 lb 8.7 oz)  SpO2:  98% 96% 94%   Patient states that she feels some better and was able to ambulate well.  She denies SOB, cough, fevers, chills.   General: AAF, No acute distress, sitting in chair HEENT: NCAT, MMM  Cardiovascular: RRR, nl S1, S2 no mrg, 2+ pulses, warm extremities Respiratory: CTAB, no increased WOB  Abdomen: NABS, soft, NT/ND  MSK: Normal tone and bulk, 1+ LEE. TTP over the low back with palpable muscle spasms, stable from prior Neuro: Grossly intact   Discharge Instructions      Discharge Orders   Future Appointments Provider Department Dept Phone   05/22/2013 10:45 AM [facility] Team [facility] [phone]   06/14/2013 3:30 PM Malissa Hippo, MD  [facility] [phone]   07/18/2013 10:45 AM [facility] Team [facility] [phone]   Future Orders Complete By Expires     Call MD for:  difficulty breathing, headache or visual disturbances  As directed     Call MD for:  extreme fatigue  As directed     Call MD for:  hives  As directed     Call MD for:  persistant dizziness or light-headedness  As directed     Call MD for:  persistant nausea and vomiting  As directed     Call MD for:  severe uncontrolled pain  As directed     Call MD for:  temperature >100.4  As directed     Diet - low sodium heart healthy  As directed     Discharge  instructions  As directed     Comments:      You were hospitalized with chest pain and your chest x-ray initially showed a possible pneumonia.  Your CAT scan of the chest did not show pneumonia, but did show that your lungs were a little compressed probably from being more sedentary recently.  Please be as active as possible.  Your blood tests for heart attack were negative.  You did not have any arrhythmias on the heart monitor.  You were initially started on antibiotics, but these were stopped after your CAT scan of the lungs showed no pneumonia.  If you start feeling more Kolbi Tofte of breath or have fevers, you may need to start antibiotics and this can be done by your primary care doctor.  For your back pain, you have a compression fracture of  the L1 vertebra.  Please use muscle relaxants, pain medication, heating pads for pain control.  We will arrange for some physical therapy.  If you continue to have severe pain, you may talk to your primary care doctor about kyphoplasty, a procedure which may reduce your pain.  Your doctor should repeat your blood work at your follow up appointment and may refer you for an ultrasound of your heart.  Your tests for blood clot in the legs were negative.  Please take colace and bisacodyl as needed to prevent constipation while taking narcotic pain medications.    Driving Restrictions  As directed     Comments:      Do not drive or operate heavy machinery while taking robaxin or narcotic pain medication.    Increase activity slowly  As directed         Medication List    TAKE these medications       albuterol 108 (90 BASE) MCG/ACT inhaler  Commonly known as:  PROVENTIL HFA;VENTOLIN HFA  Inhale 2 puffs into the lungs every 6 (six) hours as needed for wheezing.     bifidobacterium infantis capsule  Take 1 capsule by mouth daily.     CALCIUM + D PO  Take 1 tablet by mouth daily.     dicyclomine 10 MG/5ML syrup  Commonly known as:  BENTYL  Take 5 mLs (10 mg  total) by mouth 4 (four) times daily -  before meals and at bedtime.     HYDROcodone-acetaminophen 5-325 MG per tablet  Commonly known as:  NORCO/VICODIN  Take 1 tablet by mouth 2 (two) times daily as needed for pain.     methocarbamol 750 MG tablet  Commonly known as:  ROBAXIN  Take 2 tablets (1,500 mg total) by mouth every 8 (eight) hours as needed.     multivitamin with minerals Tabs  Take 1 tablet by mouth daily.     pantoprazole 40 MG tablet  Commonly known as:  PROTONIX  Take 1 tablet (40 mg total) by mouth daily.     potassium chloride SA 20 MEQ tablet  Commonly known as:  K-DUR,KLOR-CON  Take 1 tablet (20 mEq total) by mouth 2 (two) times daily.     spironolactone 50 MG tablet  Commonly known as:  ALDACTONE  Take 50 mg by mouth daily.     tioconazole 6.5 % vaginal ointment  Commonly known as:  MONISTAT 1  Place 1 applicator vaginally once.     VITAMIN B-12 IJ  Inject 1 each as directed every 30 (thirty) days.       Follow-up Information   Schedule an appointment as soon as possible for a visit with [name], MD.   Contact information:   [address] [place] [address] [phone]       The results of significant diagnostics from this hospitalization (including imaging, microbiology, ancillary and laboratory) are listed below for reference.    Significant Diagnostic Studies: Dg Chest 2 View  04/29/2013   *RADIOLOGY REPORT*  Clinical Data: Chest pain and shortness of breath; lower back pain.  CHEST - 2 VIEW  Comparison: Chest radiograph from 03/21 to S 14  Findings: The lungs are well-aerated.  New right basilar airspace opacification raises concern for mild pneumonia.  No definite pleural effusion or pneumothorax is seen.  There may also be slight left basilar airspace opacity.  The heart is normal in size; a large hiatal hernia is seen, predominantly filled with air.  No acute osseous abnormalities are seen.  Postoperative change is noted  overlying the superior mediastinum.  Clips are noted within the right upper quadrant, reflecting prior cholecystectomy.  IMPRESSION:  1.  New right basilar airspace opacification, concerning for mild pneumonia; question of mild left basilar airspace opacity. 2.  Large hiatal hernia seen.   Original Report Authenticated By: Tonia Ghent, M.D.   Dg Thoracic Spine 2 View  04/29/2013   *RADIOLOGY REPORT*  Clinical Data: Acute onset of back pain after working in the yard yesterday.  THORACIC SPINE - 2 VIEW  Comparison: None.  Findings: 12 rib-bearing thoracic vertebrae with anatomic alignment.  No fractures.  Mild mid thoracic spondylosis. Generalized osseous demineralization.  Intact pedicles.  IMPRESSION: No acute osseous abnormality.  Mild mid thoracic spondylosis. Generalized osseous demineralization.   Original Report Authenticated By: Hulan Saas, M.D.   Dg Lumbar Spine Complete  04/29/2013   *RADIOLOGY REPORT*  Clinical Data: Acute onset of back pain after working in the ER yesterday.  LUMBAR SPINE - COMPLETE 4+ VIEW  Comparison: Bone window images from CT abdomen and pelvis 12/26/2012.  Findings: Five non-rib bearing lumbar vertebrae with anatomic alignment.  Mild compression fracture of the anterior L1 vertebral body, 20-30% or so, new since the prior CT.  No other fractures. Generalized osseous demineralization. Well-preserved disc spaces. No pars defects.  No significant facet arthropathy.  Visualized sacroiliac joints intact.  IMPRESSION: Mild compression fracture of the anterior L1 vertebral body on the order of 20-30% or so, new since January, 2014.  This could certainly be acute.  Generalized osseous demineralization.   Original Report Authenticated By: Hulan Saas, M.D.   Ct Chest Wo Contrast  04/29/2013   *RADIOLOGY REPORT*  Clinical Data: Evaluate basilar airspace disease on recent chest imaging.  CT CHEST WITHOUT CONTRAST  Technique:  Multidetector CT imaging of the chest was  performed following the standard protocol without IV contrast.  Comparison: Two-view chest x-ray yesterday.  No prior CT.  Findings: Atelectasis in the right lower lobe, right middle lobe, and to a lesser degree left lower lobe, with crowding of the bronchovascular markings.  This is due to the very large hiatal hernia which will be described in detail below.  Pulmonary parenchyma otherwise clear.  No pulmonary parenchymal nodules or masses.  No pleural effusions.  Central airways patent without significant bronchial wall thickening.  Heart mildly enlarged with left ventricular predominance.  No visible coronary atherosclerosis.  No pericardial effusion.  Mild atherosclerosis involving the aortic arch and the upper abdominal aorta.  Marked enlargement of the right lobe of the thyroid gland which contains multiple nodules, the largest approximating 1.3 - 1.4 cm. No significant mediastinal, hilar, or axillary lymphadenopathy.  Very large hiatal hernia which contains at least 2/3 of the stomach.  A portion of the mid transverse colon is also present in the hernia.  Gallbladder surgically absent.  Remainder of the visualized upper abdomen unremarkable for the unenhanced technique. Bone window images demonstrate generalized osseous demineralization and mild thoracic spondylosis.  IMPRESSION:  1.  Mild atelectasis in the lower lobes and right middle lobe, due to the very large hiatal hernia.  No acute cardiopulmonary disease otherwise. 2.  Very large hiatal hernia containing at least 2/3 of the stomach and a portion of the mid transverse colon. 3.  Multinodular thyroid goiter.   Original Report Authenticated By: Hulan Saas, M.D.    Microbiology: No results found for this or any previous visit (from the past 240 hour(s)).  Labs: Basic Metabolic Panel:  Recent Labs Lab 04/28/13 2305 04/29/13 0621  NA 137 136  K 3.9 4.3  CL 104 105  CO2 20 17*  GLUCOSE 91 93  BUN 20 17  CREATININE 1.27* 1.26*   CALCIUM 9.5 9.0   Liver Function Tests:  Recent Labs Lab 04/29/13 0621  AST 15  ALT 17  ALKPHOS 100  BILITOT 0.2*  PROT 7.2  ALBUMIN 3.0*   No results found for this basename: LIPASE, AMYLASE,  in the last 168 hours No results found for this basename: AMMONIA,  in the last 168 hours CBC:  Recent Labs Lab 04/28/13 2305 04/29/13 0621  WBC 20.7* 19.0*  NEUTROABS  --  12.9*  HGB 10.6* 11.1*  HCT 33.0* 34.7*  MCV 92.7 92.5  PLT 269 257   Cardiac Enzymes:  Recent Labs Lab 04/29/13 0621 04/29/13 1120 04/29/13 1822  TROPONINI <0.30 <0.30 <0.30   BNP: BNP (last 3 results)  Recent Labs  02/24/13 1625 04/28/13 2305  PROBNP 511.1* 215.7*   CBG: No results found for this basename: GLUCAP,  in the last 168 hours  Time coordinating discharge: 45 minutes  Signed:  Eura Radabaugh  [facility] 04/30/2013, 9:54 AM

## 2013-04-30 NOTE — Evaluation (Signed)
Physical Therapy Evaluation Patient Details Name: Danielle [name] MRN: [id number] DOB: 1946/11/25 Today's Date: 04/30/2013 Time: 0952-1010 PT Time Calculation (min): 18 min  PT Assessment / Plan / Recommendation Clinical Impression  Patient is a 67 yo female admitted with SOB, CP, and L1 compression fracture.  Patient with decreased mobility due to pain.  Patient to be discharged today.  Recommend HHPT to continue therapy at discharge.    PT Assessment  All further PT needs can be met in the next venue of care    Follow Up Recommendations  Home health PT;Supervision/Assistance - 24 hour    Does the patient have the potential to tolerate intense rehabilitation      Barriers to Discharge        Equipment Recommendations  None recommended by PT    Recommendations for Other Services     Frequency      Precautions / Restrictions Precautions Precautions: None Restrictions Weight Bearing Restrictions: No   Pertinent Vitals/Pain       Mobility  Bed Mobility Bed Mobility: Not assessed (Patient in chair as PT entered room) Details for Bed Mobility Assistance: Provided patient with education on log rolling and sidelying <-> sitting without twisting back to minimize pain.  Demonstrated for patient. Transfers Transfers: Sit to Stand;Stand to Sit Sit to Stand: 4: Min guard;With upper extremity assist;With armrests;From chair/3-in-1 Stand to Sit: 4: Min guard;With upper extremity assist;With armrests;To chair/3-in-1 Details for Transfer Assistance: Verbal cues for hand placement and technique.  Scooting to edge of chair prior to standing.  Patient with increased time to complete transfers due to pain. Ambulation/Gait Ambulation/Gait Assistance: 4: Min guard Ambulation Distance (Feet): 130 Feet Assistive device: Straight cane Ambulation/Gait Assistance Details: Verbal cues to stand upright during gait.  Patient using correct gait sequence with cane in LUE.  Patient with slow,  guarded gait. Gait Pattern: Step-through pattern;Decreased step length - right;Decreased step length - left Gait velocity: Slow gait speed    Exercises     PT Diagnosis: Difficulty walking;Abnormality of gait;Acute pain  PT Problem List: Decreased strength;Decreased activity tolerance;Decreased balance;Decreased mobility;Decreased knowledge of use of DME;Pain PT Treatment Interventions:   HHPT f/u  PT Goals  N/A  Visit Information  Last PT Received On: 04/30/13 Assistance Needed: +1    Subjective Data  Subjective: "I was working in the garden when I hurt my back" Patient Stated Goal: To decrease spasms.  To go home.   Prior Functioning  Home Living Lives With: Other (Comment) (Sister) Available Help at Discharge: Family;Available 24 hours/day Type of Home: House Home Access: Level entry Home Layout: One level Bathroom Shower/Tub: Health visitor: Standard Bathroom Accessibility: Yes How Accessible: Accessible via walker Home Adaptive Equipment: Straight cane;Shower chair with back;Hand-held shower hose Prior Function Level of Independence: Independent Able to Take Stairs?: Yes Driving: Yes Vocation: Retired Musician: No difficulties Dominant Hand: Left    Cognition  Cognition Arousal/Alertness: Awake/alert Behavior During Therapy: WFL for tasks assessed/performed Overall Cognitive Status: Within Functional Limits for tasks assessed    Extremity/Trunk Assessment Right Upper Extremity Assessment RUE ROM/Strength/Tone: WFL for tasks assessed RUE Sensation: WFL - Light Touch Left Upper Extremity Assessment LUE ROM/Strength/Tone: WFL for tasks assessed LUE Sensation: WFL - Light Touch Right Lower Extremity Assessment RLE ROM/Strength/Tone: WFL for tasks assessed RLE Sensation: WFL - Light Touch Left Lower Extremity Assessment LLE ROM/Strength/Tone: WFL for tasks assessed LLE Sensation: WFL - Light Touch   Balance    End of  Session PT -  End of Session Equipment Utilized During Treatment: Gait belt Activity Tolerance: Patient limited by pain;Patient limited by fatigue Patient left: in chair;with call bell/phone within reach Nurse Communication: Mobility status  GP     Vena Austria 04/30/2013, 10:19 AM Durenda Hurt. Renaldo Fiddler, [facility] Acute Rehab Services Pager [phone]

## 2013-04-30 NOTE — Progress Notes (Signed)
Spoke with pt about HHPT arrangements for discharge planned for today.  She chose to use [facility] when offered choice of local agencies since she already receives supplies/equipment from them. Confirmed the address and phone number listed in EPIC are correct.  Contacted [facility] main office and spoke with Asher Muir who said she would have start of care tomorrow, 05/01/2013.

## 2013-05-02 DIAGNOSIS — R269 Unspecified abnormalities of gait and mobility: Secondary | ICD-10-CM | POA: Diagnosis not present

## 2013-05-02 DIAGNOSIS — D518 Other vitamin B12 deficiency anemias: Secondary | ICD-10-CM | POA: Diagnosis not present

## 2013-05-02 DIAGNOSIS — M810 Age-related osteoporosis without current pathological fracture: Secondary | ICD-10-CM | POA: Diagnosis not present

## 2013-05-02 DIAGNOSIS — S32009A Unspecified fracture of unspecified lumbar vertebra, initial encounter for closed fracture: Secondary | ICD-10-CM | POA: Diagnosis not present

## 2013-05-02 DIAGNOSIS — M545 Low back pain: Secondary | ICD-10-CM | POA: Diagnosis not present

## 2013-05-02 DIAGNOSIS — IMO0001 Reserved for inherently not codable concepts without codable children: Secondary | ICD-10-CM | POA: Diagnosis not present

## 2013-05-04 ENCOUNTER — Encounter (INDEPENDENT_AMBULATORY_CARE_PROVIDER_SITE_OTHER): Payer: Self-pay | Admitting: *Deleted

## 2013-05-04 ENCOUNTER — Other Ambulatory Visit (INDEPENDENT_AMBULATORY_CARE_PROVIDER_SITE_OTHER): Payer: Self-pay | Admitting: *Deleted

## 2013-05-04 DIAGNOSIS — S32009A Unspecified fracture of unspecified lumbar vertebra, initial encounter for closed fracture: Secondary | ICD-10-CM | POA: Diagnosis not present

## 2013-05-04 DIAGNOSIS — M810 Age-related osteoporosis without current pathological fracture: Secondary | ICD-10-CM | POA: Diagnosis not present

## 2013-05-04 DIAGNOSIS — IMO0001 Reserved for inherently not codable concepts without codable children: Secondary | ICD-10-CM | POA: Diagnosis not present

## 2013-05-04 DIAGNOSIS — M545 Low back pain: Secondary | ICD-10-CM | POA: Diagnosis not present

## 2013-05-04 DIAGNOSIS — D518 Other vitamin B12 deficiency anemias: Secondary | ICD-10-CM | POA: Diagnosis not present

## 2013-05-04 DIAGNOSIS — D649 Anemia, unspecified: Secondary | ICD-10-CM

## 2013-05-04 DIAGNOSIS — R269 Unspecified abnormalities of gait and mobility: Secondary | ICD-10-CM | POA: Diagnosis not present

## 2013-05-08 LAB — CULTURE, BLOOD (ROUTINE X 2): Culture: NO GROWTH

## 2013-05-09 ENCOUNTER — Other Ambulatory Visit (INDEPENDENT_AMBULATORY_CARE_PROVIDER_SITE_OTHER): Payer: Self-pay | Admitting: Internal Medicine

## 2013-05-11 DIAGNOSIS — M810 Age-related osteoporosis without current pathological fracture: Secondary | ICD-10-CM | POA: Diagnosis not present

## 2013-05-11 DIAGNOSIS — M545 Low back pain: Secondary | ICD-10-CM | POA: Diagnosis not present

## 2013-05-11 DIAGNOSIS — D518 Other vitamin B12 deficiency anemias: Secondary | ICD-10-CM | POA: Diagnosis not present

## 2013-05-11 DIAGNOSIS — R269 Unspecified abnormalities of gait and mobility: Secondary | ICD-10-CM | POA: Diagnosis not present

## 2013-05-11 DIAGNOSIS — IMO0001 Reserved for inherently not codable concepts without codable children: Secondary | ICD-10-CM | POA: Diagnosis not present

## 2013-05-11 DIAGNOSIS — S32009A Unspecified fracture of unspecified lumbar vertebra, initial encounter for closed fracture: Secondary | ICD-10-CM | POA: Diagnosis not present

## 2013-05-12 DIAGNOSIS — M810 Age-related osteoporosis without current pathological fracture: Secondary | ICD-10-CM | POA: Diagnosis not present

## 2013-05-12 DIAGNOSIS — D518 Other vitamin B12 deficiency anemias: Secondary | ICD-10-CM | POA: Diagnosis not present

## 2013-05-12 DIAGNOSIS — R269 Unspecified abnormalities of gait and mobility: Secondary | ICD-10-CM | POA: Diagnosis not present

## 2013-05-12 DIAGNOSIS — IMO0001 Reserved for inherently not codable concepts without codable children: Secondary | ICD-10-CM | POA: Diagnosis not present

## 2013-05-12 DIAGNOSIS — M545 Low back pain: Secondary | ICD-10-CM | POA: Diagnosis not present

## 2013-05-12 DIAGNOSIS — S32009A Unspecified fracture of unspecified lumbar vertebra, initial encounter for closed fracture: Secondary | ICD-10-CM | POA: Diagnosis not present

## 2013-05-16 DIAGNOSIS — R269 Unspecified abnormalities of gait and mobility: Secondary | ICD-10-CM | POA: Diagnosis not present

## 2013-05-16 DIAGNOSIS — M810 Age-related osteoporosis without current pathological fracture: Secondary | ICD-10-CM | POA: Diagnosis not present

## 2013-05-16 DIAGNOSIS — D518 Other vitamin B12 deficiency anemias: Secondary | ICD-10-CM | POA: Diagnosis not present

## 2013-05-16 DIAGNOSIS — D510 Vitamin B12 deficiency anemia due to intrinsic factor deficiency: Secondary | ICD-10-CM | POA: Diagnosis not present

## 2013-05-16 DIAGNOSIS — S32009A Unspecified fracture of unspecified lumbar vertebra, initial encounter for closed fracture: Secondary | ICD-10-CM | POA: Diagnosis not present

## 2013-05-16 DIAGNOSIS — D649 Anemia, unspecified: Secondary | ICD-10-CM | POA: Diagnosis not present

## 2013-05-16 DIAGNOSIS — K5090 Crohn's disease, unspecified, without complications: Secondary | ICD-10-CM | POA: Diagnosis not present

## 2013-05-16 DIAGNOSIS — M545 Low back pain: Secondary | ICD-10-CM | POA: Diagnosis not present

## 2013-05-16 DIAGNOSIS — IMO0001 Reserved for inherently not codable concepts without codable children: Secondary | ICD-10-CM | POA: Diagnosis not present

## 2013-05-18 DIAGNOSIS — R269 Unspecified abnormalities of gait and mobility: Secondary | ICD-10-CM | POA: Diagnosis not present

## 2013-05-18 DIAGNOSIS — M545 Low back pain: Secondary | ICD-10-CM | POA: Diagnosis not present

## 2013-05-18 DIAGNOSIS — M810 Age-related osteoporosis without current pathological fracture: Secondary | ICD-10-CM | POA: Diagnosis not present

## 2013-05-18 DIAGNOSIS — S32009A Unspecified fracture of unspecified lumbar vertebra, initial encounter for closed fracture: Secondary | ICD-10-CM | POA: Diagnosis not present

## 2013-05-18 DIAGNOSIS — IMO0001 Reserved for inherently not codable concepts without codable children: Secondary | ICD-10-CM | POA: Diagnosis not present

## 2013-05-18 DIAGNOSIS — D518 Other vitamin B12 deficiency anemias: Secondary | ICD-10-CM | POA: Diagnosis not present

## 2013-05-22 ENCOUNTER — Encounter (HOSPITAL_COMMUNITY): Payer: Self-pay

## 2013-05-22 ENCOUNTER — Encounter (HOSPITAL_COMMUNITY): Payer: Medicare Other | Attending: Internal Medicine

## 2013-05-22 VITALS — BP 99/61 | HR 87 | Temp 98.0°F | Resp 18 | Wt 149.0 lb

## 2013-05-22 DIAGNOSIS — K5090 Crohn's disease, unspecified, without complications: Secondary | ICD-10-CM | POA: Insufficient documentation

## 2013-05-22 MED ORDER — ACETAMINOPHEN 325 MG PO TABS
ORAL_TABLET | ORAL | Status: AC
  Filled 2013-05-22: qty 2

## 2013-05-22 MED ORDER — LORATADINE 10 MG PO TABS
ORAL_TABLET | ORAL | Status: AC
  Filled 2013-05-22: qty 1

## 2013-05-22 MED ORDER — LORATADINE 10 MG PO TABS
10.0000 mg | ORAL_TABLET | Freq: Once | ORAL | Status: AC
  Administered 2013-05-22: 10 mg via ORAL

## 2013-05-22 MED ORDER — SODIUM CHLORIDE 0.9 % IV SOLN
5.0000 mg/kg | Freq: Once | INTRAVENOUS | Status: AC
  Administered 2013-05-22: 300 mg via INTRAVENOUS
  Filled 2013-05-22: qty 30

## 2013-05-22 MED ORDER — SODIUM CHLORIDE 0.9 % IV SOLN
Freq: Once | INTRAVENOUS | Status: AC
  Administered 2013-05-22: 12:00:00 via INTRAVENOUS

## 2013-05-22 MED ORDER — ACETAMINOPHEN 325 MG PO TABS
650.0000 mg | ORAL_TABLET | Freq: Once | ORAL | Status: AC
  Administered 2013-05-22: 650 mg via ORAL

## 2013-05-22 NOTE — Progress Notes (Signed)
Tolerated infusion well. 

## 2013-05-23 ENCOUNTER — Ambulatory Visit (INDEPENDENT_AMBULATORY_CARE_PROVIDER_SITE_OTHER): Payer: Medicare Other | Admitting: Internal Medicine

## 2013-06-14 ENCOUNTER — Encounter (INDEPENDENT_AMBULATORY_CARE_PROVIDER_SITE_OTHER): Payer: Self-pay | Admitting: Internal Medicine

## 2013-06-14 ENCOUNTER — Ambulatory Visit (INDEPENDENT_AMBULATORY_CARE_PROVIDER_SITE_OTHER): Payer: Medicare Other | Admitting: Internal Medicine

## 2013-06-14 VITALS — BP 102/68 | HR 74 | Temp 97.6°F | Resp 18 | Ht 64.0 in | Wt 139.2 lb

## 2013-06-14 DIAGNOSIS — M2550 Pain in unspecified joint: Secondary | ICD-10-CM

## 2013-06-14 DIAGNOSIS — K50919 Crohn's disease, unspecified, with unspecified complications: Secondary | ICD-10-CM

## 2013-06-14 DIAGNOSIS — K5090 Crohn's disease, unspecified, without complications: Secondary | ICD-10-CM

## 2013-06-14 MED ORDER — HYDROCODONE-ACETAMINOPHEN 5-325 MG PO TABS: 1.0000 | ORAL_TABLET | Freq: Two times a day (BID) | ORAL | Status: DC | PRN

## 2013-06-14 NOTE — Progress Notes (Signed)
Presenting complaint;  Followup for Crohn's disease. Patient complains of pain involving multiple joints.  Subjective:  Patient is 67 year old African female who is here for scheduled visit accompanied by her sister. She was last seen on 03/21/2013. Last infusion of infliximab was on 05/17/2013. She states as far as her Crohn's disease is concerned she is doing great. She denies nausea vomiting abdominal pain melena or rectal bleeding she is having 3 stools per day. Most of her stools are mushy are soft but every now and then she is passing formed stool. She is not having rectal discharge. Her appetite is good and she has gained 5 pounds since her last visit. However today she is complaining of pain in multiple joints involving both hands wrists elbows ankles as well as shoulders. Today is a better day but on her bad days she cannot use her hand for simple tasks such as opening a jar. She is having no side effects with infliximab. A few weeks ago she developed a rash and was seen by Dr. Loleta Chance. Rash is completely resolved at emollient application. She says her heartburn is well controlled with therapy. At times she forgets to take second dose of pantoprazole. About 6 weeks ago she was briefly hospitalized at  [facility] her back and chest pain. Troponin levels were negative. She was noted to have slight fracture to L1. Back pain has resolved.    Current Medications: Current Outpatient Prescriptions  Medication Sig Dispense Refill  . albuterol (PROVENTIL HFA;VENTOLIN HFA) 108 (90 BASE) MCG/ACT inhaler Inhale 2 puffs into the lungs every 6 (six) hours as needed for wheezing.  1 Inhaler  2  . bifidobacterium infantis (ALIGN) capsule Take 1 capsule by mouth daily.  30 capsule  5  . Calcium Carbonate-Vitamin D (CALCIUM + D PO) Take 1 tablet by mouth daily.      . Cyanocobalamin (VITAMIN B-12 IJ) Inject 1 each as directed every 30 (thirty) days.      [name] dicyclomine (BENTYL) 10 MG/5ML syrup Take 5 mLs (10 mg  total) by mouth 4 (four) times daily -  before meals and at bedtime.  600 mL  2  . HYDROcodone-acetaminophen (NORCO/VICODIN) 5-325 MG per tablet Take 1 tablet by mouth 2 (two) times daily as needed for pain.  30 tablet  0  . Multiple Vitamin (MULTIVITAMIN WITH MINERALS) TABS Take 1 tablet by mouth daily.      . pantoprazole (PROTONIX) 40 MG tablet TAKE 1 TABLET BY MOUTH TWICE A DAY  60 tablet  11  . potassium chloride SA (K-DUR,KLOR-CON) 20 MEQ tablet Take 1 tablet (20 mEq total) by mouth 2 (two) times daily.  30 tablet  2  . spironolactone (ALDACTONE) 50 MG tablet Take 50 mg by mouth daily.       [name] tioconazole (MONISTAT 1) 6.5 % vaginal ointment Place 1 applicator vaginally once.  8 g  0   No current facility-administered medications for this visit.     Objective: Blood pressure 102/68, pulse 74, temperature 97.6 F (36.4 C), temperature source Oral, resp. rate 18, height 5\' 4"  (1.626 m), weight 139 lb 3.2 oz (63.141 kg). Patient is alert and in no acute distress Conjunctiva is pink. Sclera is nonicteric Oropharyngeal mucosa is normal. No neck masses or thyromegaly noted. Cardiac exam with regular rhythm normal S1 and S2. No murmur or gallop noted. Lungs are clear to auscultation. Abdomen. Multiple scars. Abdomen is soft and nontender without organomegaly or masses.  She does not have peripheral edema. DIPs,  interphalangeal and MCPs without swelling or tenderness. Right wrist is full and and warm.  Labs/studies Results:  Lab data from 04/29/2013 reviewed.  Assessment:  #1. Ileal and perianal Crohn's disease. She is in remission. #2. Joint pain involving multiple joints. Right wrist is swollen and warm. 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 She is not a candidate for NSAID therapy. Will request rheumatology consultation and in the meantime she can take hydrocodone for pain on as-needed basis. #3. GERD. Symptoms well-controlled with therapy. #4. History of hypokalemia. Serum  potassium normal on 04/29/2013  Plan: Continue infliximab Remicade infusion as scheduled. Schedule bone density study and request prior study from [facility] for comparison. Take 2 tablets of calcium and vitamin D daily. Vicodin 5/325 one by mouth twice a day when necessary. Rheumatology consultation with Dr. Stacey Drain. Office visit in 8 weeks.

## 2013-06-14 NOTE — Patient Instructions (Signed)
Consultation with Dr. Kellie Simmering to be scheduled. Physician will contact her with results of bone density study when completed. Take 2 tablets of calcium and vitamin D daily

## 2013-06-15 ENCOUNTER — Other Ambulatory Visit (INDEPENDENT_AMBULATORY_CARE_PROVIDER_SITE_OTHER): Payer: Self-pay | Admitting: Internal Medicine

## 2013-06-17 ENCOUNTER — Other Ambulatory Visit (INDEPENDENT_AMBULATORY_CARE_PROVIDER_SITE_OTHER): Payer: Self-pay | Admitting: Internal Medicine

## 2013-06-19 ENCOUNTER — Other Ambulatory Visit (INDEPENDENT_AMBULATORY_CARE_PROVIDER_SITE_OTHER): Payer: Self-pay | Admitting: Internal Medicine

## 2013-06-19 DIAGNOSIS — M810 Age-related osteoporosis without current pathological fracture: Secondary | ICD-10-CM

## 2013-06-19 DIAGNOSIS — K50119 Crohn's disease of large intestine with unspecified complications: Secondary | ICD-10-CM

## 2013-06-19 DIAGNOSIS — M2550 Pain in unspecified joint: Secondary | ICD-10-CM

## 2013-06-26 ENCOUNTER — Ambulatory Visit (HOSPITAL_COMMUNITY)
Admission: RE | Admit: 2013-06-26 | Discharge: 2013-06-26 | Disposition: A | Discharge status: Home / Self Care | Payer: Medicare Other | Source: Ambulatory Visit | Attending: Internal Medicine | Admitting: Internal Medicine

## 2013-06-26 DIAGNOSIS — Z13820 Encounter for screening for osteoporosis: Secondary | ICD-10-CM | POA: Diagnosis not present

## 2013-06-26 DIAGNOSIS — K50119 Crohn's disease of large intestine with unspecified complications: Secondary | ICD-10-CM

## 2013-06-26 DIAGNOSIS — M2550 Pain in unspecified joint: Secondary | ICD-10-CM

## 2013-06-26 DIAGNOSIS — Z780 Asymptomatic menopausal state: Secondary | ICD-10-CM | POA: Diagnosis not present

## 2013-06-26 DIAGNOSIS — M810 Age-related osteoporosis without current pathological fracture: Secondary | ICD-10-CM | POA: Diagnosis not present

## 2013-06-27 ENCOUNTER — Telehealth (INDEPENDENT_AMBULATORY_CARE_PROVIDER_SITE_OTHER): Payer: Self-pay | Admitting: *Deleted

## 2013-06-27 DIAGNOSIS — K50918 Crohn's disease, unspecified, with other complication: Secondary | ICD-10-CM

## 2013-06-27 DIAGNOSIS — M810 Age-related osteoporosis without current pathological fracture: Secondary | ICD-10-CM

## 2013-06-27 NOTE — Telephone Encounter (Signed)
Per Dr.Rehman the patient will need to have labs drawn. 

## 2013-07-05 NOTE — Telephone Encounter (Signed)
Patient had these labs drawn on 07/04/13

## 2013-07-05 NOTE — Telephone Encounter (Signed)
Will cancel order  Unable to sign.

## 2013-07-12 ENCOUNTER — Encounter (HOSPITAL_COMMUNITY): Payer: Self-pay | Admitting: Emergency Medicine

## 2013-07-12 ENCOUNTER — Emergency Department (HOSPITAL_COMMUNITY): Payer: Medicare Other

## 2013-07-12 ENCOUNTER — Inpatient Hospital Stay (HOSPITAL_COMMUNITY)
Admission: EM | Admit: 2013-07-12 | Discharge: 2013-07-16 | DRG: 392 | Disposition: A | Discharge status: Home / Self Care | Payer: Medicare Other | Attending: Internal Medicine | Admitting: Internal Medicine

## 2013-07-12 DIAGNOSIS — E872 Acidosis, unspecified: Secondary | ICD-10-CM | POA: Diagnosis not present

## 2013-07-12 DIAGNOSIS — M549 Dorsalgia, unspecified: Secondary | ICD-10-CM | POA: Diagnosis present

## 2013-07-12 DIAGNOSIS — I959 Hypotension, unspecified: Secondary | ICD-10-CM | POA: Diagnosis present

## 2013-07-12 DIAGNOSIS — R079 Chest pain, unspecified: Secondary | ICD-10-CM

## 2013-07-12 DIAGNOSIS — Z87891 Personal history of nicotine dependence: Secondary | ICD-10-CM | POA: Diagnosis not present

## 2013-07-12 DIAGNOSIS — Z79899 Other long term (current) drug therapy: Secondary | ICD-10-CM | POA: Diagnosis not present

## 2013-07-12 DIAGNOSIS — R197 Diarrhea, unspecified: Secondary | ICD-10-CM

## 2013-07-12 DIAGNOSIS — K529 Noninfective gastroenteritis and colitis, unspecified: Secondary | ICD-10-CM

## 2013-07-12 DIAGNOSIS — N179 Acute kidney failure, unspecified: Secondary | ICD-10-CM | POA: Diagnosis not present

## 2013-07-12 DIAGNOSIS — R1310 Dysphagia, unspecified: Secondary | ICD-10-CM

## 2013-07-12 DIAGNOSIS — K219 Gastro-esophageal reflux disease without esophagitis: Secondary | ICD-10-CM | POA: Diagnosis present

## 2013-07-12 DIAGNOSIS — K5289 Other specified noninfective gastroenteritis and colitis: Secondary | ICD-10-CM | POA: Diagnosis not present

## 2013-07-12 DIAGNOSIS — D72829 Elevated white blood cell count, unspecified: Secondary | ICD-10-CM | POA: Diagnosis present

## 2013-07-12 DIAGNOSIS — E860 Dehydration: Secondary | ICD-10-CM

## 2013-07-12 DIAGNOSIS — K611 Rectal abscess: Secondary | ICD-10-CM

## 2013-07-12 DIAGNOSIS — R531 Weakness: Secondary | ICD-10-CM

## 2013-07-12 DIAGNOSIS — R432 Parageusia: Secondary | ICD-10-CM

## 2013-07-12 DIAGNOSIS — N390 Urinary tract infection, site not specified: Secondary | ICD-10-CM | POA: Diagnosis not present

## 2013-07-12 DIAGNOSIS — K5090 Crohn's disease, unspecified, without complications: Secondary | ICD-10-CM | POA: Diagnosis present

## 2013-07-12 DIAGNOSIS — A088 Other specified intestinal infections: Secondary | ICD-10-CM | POA: Diagnosis present

## 2013-07-12 DIAGNOSIS — R05 Cough: Secondary | ICD-10-CM

## 2013-07-12 DIAGNOSIS — D649 Anemia, unspecified: Secondary | ICD-10-CM

## 2013-07-12 DIAGNOSIS — R112 Nausea with vomiting, unspecified: Secondary | ICD-10-CM

## 2013-07-12 DIAGNOSIS — R790 Abnormal level of blood mineral: Secondary | ICD-10-CM

## 2013-07-12 DIAGNOSIS — M545 Low back pain: Secondary | ICD-10-CM

## 2013-07-12 DIAGNOSIS — K449 Diaphragmatic hernia without obstruction or gangrene: Secondary | ICD-10-CM

## 2013-07-12 DIAGNOSIS — G8929 Other chronic pain: Secondary | ICD-10-CM | POA: Diagnosis present

## 2013-07-12 DIAGNOSIS — B961 Klebsiella pneumoniae [K. pneumoniae] as the cause of diseases classified elsewhere: Secondary | ICD-10-CM | POA: Diagnosis present

## 2013-07-12 DIAGNOSIS — M2550 Pain in unspecified joint: Secondary | ICD-10-CM

## 2013-07-12 DIAGNOSIS — N289 Disorder of kidney and ureter, unspecified: Secondary | ICD-10-CM | POA: Diagnosis not present

## 2013-07-12 DIAGNOSIS — K50919 Crohn's disease, unspecified, with unspecified complications: Secondary | ICD-10-CM

## 2013-07-12 DIAGNOSIS — E871 Hypo-osmolality and hyponatremia: Secondary | ICD-10-CM

## 2013-07-12 DIAGNOSIS — D473 Essential (hemorrhagic) thrombocythemia: Secondary | ICD-10-CM

## 2013-07-12 DIAGNOSIS — E876 Hypokalemia: Secondary | ICD-10-CM | POA: Diagnosis present

## 2013-07-12 DIAGNOSIS — D509 Iron deficiency anemia, unspecified: Secondary | ICD-10-CM | POA: Diagnosis not present

## 2013-07-12 DIAGNOSIS — R109 Unspecified abdominal pain: Secondary | ICD-10-CM | POA: Diagnosis not present

## 2013-07-12 DIAGNOSIS — K56609 Unspecified intestinal obstruction, unspecified as to partial versus complete obstruction: Secondary | ICD-10-CM

## 2013-07-12 DIAGNOSIS — J9811 Atelectasis: Secondary | ICD-10-CM

## 2013-07-12 DIAGNOSIS — I4581 Long QT syndrome: Secondary | ICD-10-CM | POA: Diagnosis not present

## 2013-07-12 DIAGNOSIS — R9431 Abnormal electrocardiogram [ECG] [EKG]: Secondary | ICD-10-CM

## 2013-07-12 DIAGNOSIS — R0602 Shortness of breath: Secondary | ICD-10-CM

## 2013-07-12 LAB — TROPONIN I: Troponin I: <0.3 ng/mL (ref <0.30)

## 2013-07-12 LAB — COMPREHENSIVE METABOLIC PANEL
AST: 28 U/L (ref 0–37)
Albumin: 3.3 g/dL — ABNORMAL LOW (ref 3.5–5.2)
BUN: 26 mg/dL — ABNORMAL HIGH (ref 6–23)
Calcium: 8 mg/dL — ABNORMAL LOW (ref 8.4–10.5)
Creatinine, Ser: 1.92 mg/dL — ABNORMAL HIGH (ref 0.50–1.10)
GFR calc non Af Amer: 26 mL/min — ABNORMAL LOW (ref >90)
Total Bilirubin: 0.5 mg/dL (ref 0.3–1.2)

## 2013-07-12 LAB — URINALYSIS, ROUTINE W REFLEX MICROSCOPIC
Bilirubin Urine: NEGATIVE
Nitrite: NEGATIVE
Protein, ur: 30 mg/dL — AB
Specific Gravity, Urine: 1.005 (ref 1.005–1.030)
Urobilinogen, UA: 0.2 mg/dL (ref 0.0–1.0)

## 2013-07-12 LAB — CBC WITH DIFFERENTIAL/PLATELET
Basophils Absolute: 0 10*3/uL (ref 0.0–0.1)
Basophils Relative: 0 % (ref 0–1)
Eosinophils Relative: 0 % (ref 0–5)
HCT: 36.6 % (ref 36.0–46.0)
Hemoglobin: 13.2 g/dL (ref 12.0–15.0)
MCH: 28.1 pg (ref 26.0–34.0)
MCHC: 36.1 g/dL — ABNORMAL HIGH (ref 30.0–36.0)
MCV: 78 fL (ref 78.0–100.0)
Monocytes Absolute: 0.3 10*3/uL (ref 0.1–1.0)
Monocytes Relative: 3 % (ref 3–12)
RDW: 16.2 % — ABNORMAL HIGH (ref 11.5–15.5)

## 2013-07-12 LAB — MAGNESIUM: Magnesium: 1.3 mg/dL — ABNORMAL LOW (ref 1.5–2.5)

## 2013-07-12 LAB — URINE MICROSCOPIC-ADD ON

## 2013-07-12 LAB — LIPASE, BLOOD: Lipase: 61 U/L — ABNORMAL HIGH (ref 11–59)

## 2013-07-12 LAB — LACTIC ACID, PLASMA: Lactic Acid, Venous: 1.2 mmol/L (ref 0.5–2.2)

## 2013-07-12 LAB — POTASSIUM: Potassium: <2 mEq/L — CL (ref 3.5–5.1)

## 2013-07-12 MED ORDER — ALUM & MAG HYDROXIDE-SIMETH 200-200-20 MG/5ML PO SUSP
30.0000 mL | Freq: Four times a day (QID) | ORAL | Status: DC | PRN
  Filled 2013-07-12: qty 30

## 2013-07-12 MED ORDER — SODIUM CHLORIDE 0.9 % IJ SOLN
3.0000 mL | Freq: Two times a day (BID) | INTRAMUSCULAR | Status: DC
  Administered 2013-07-15 (×2): 3 mL via INTRAVENOUS

## 2013-07-12 MED ORDER — ONDANSETRON HCL 4 MG PO TABS
4.0000 mg | ORAL_TABLET | Freq: Four times a day (QID) | ORAL | Status: DC | PRN
  Administered 2013-07-16: 4 mg via ORAL
  Filled 2013-07-12: qty 1

## 2013-07-12 MED ORDER — POTASSIUM CHLORIDE IN NACL 40-0.9 MEQ/L-% IV SOLN
INTRAVENOUS | Status: DC
  Administered 2013-07-12: 23:00:00 via INTRAVENOUS
  Filled 2013-07-12 (×3): qty 1000

## 2013-07-12 MED ORDER — POTASSIUM CHLORIDE CRYS ER 20 MEQ PO TBCR
10.0000 meq | EXTENDED_RELEASE_TABLET | Freq: Once | ORAL | Status: AC
  Administered 2013-07-12: 10 meq via ORAL
  Filled 2013-07-12: qty 1

## 2013-07-12 MED ORDER — CIPROFLOXACIN IN D5W 400 MG/200ML IV SOLN
400.0000 mg | Freq: Two times a day (BID) | INTRAVENOUS | Status: DC
  Administered 2013-07-13 (×2): 400 mg via INTRAVENOUS
  Filled 2013-07-12 (×3): qty 200

## 2013-07-12 MED ORDER — METOCLOPRAMIDE HCL 5 MG/ML IJ SOLN
5.0000 mg | Freq: Once | INTRAMUSCULAR | Status: AC
  Administered 2013-07-12: 5 mg via INTRAVENOUS
  Filled 2013-07-12: qty 2

## 2013-07-12 MED ORDER — POTASSIUM CHLORIDE 10 MEQ/100ML IV SOLN
10.0000 meq | INTRAVENOUS | Status: AC
Start: 2013-07-12 — End: 2013-07-12
  Administered 2013-07-12 – 2013-07-13 (×3): 10 meq via INTRAVENOUS
  Filled 2013-07-12 (×5): qty 100

## 2013-07-12 MED ORDER — HYDROMORPHONE HCL PF 1 MG/ML IJ SOLN
0.5000 mg | INTRAMUSCULAR | Status: DC | PRN
  Administered 2013-07-12 – 2013-07-14 (×5): 0.5 mg via INTRAVENOUS
  Filled 2013-07-12 (×5): qty 1

## 2013-07-12 MED ORDER — SODIUM CHLORIDE 0.9 % IV BOLUS (SEPSIS)
1000.0000 mL | INTRAVENOUS | Status: AC
  Administered 2013-07-12: 1000 mL via INTRAVENOUS

## 2013-07-12 MED ORDER — MAGNESIUM SULFATE 40 MG/ML IJ SOLN
2.0000 g | Freq: Once | INTRAMUSCULAR | Status: AC
  Administered 2013-07-12: 2 g via INTRAVENOUS
  Filled 2013-07-12: qty 50

## 2013-07-12 MED ORDER — ACETAMINOPHEN 650 MG RE SUPP: 650.0000 mg | Freq: Four times a day (QID) | RECTAL | Status: DC | PRN

## 2013-07-12 MED ORDER — IOHEXOL 300 MG/ML  SOLN
25.0000 mL | INTRAMUSCULAR | Status: AC
  Administered 2013-07-12 (×2): 25 mL via ORAL

## 2013-07-12 MED ORDER — POTASSIUM CHLORIDE 10 MEQ/100ML IV SOLN
10.0000 meq | Freq: Once | INTRAVENOUS | Status: AC
  Administered 2013-07-12: 10 meq via INTRAVENOUS

## 2013-07-12 MED ORDER — CEFTRIAXONE SODIUM 1 G IJ SOLR: 1.0000 g | Freq: Once | INTRAMUSCULAR | Status: DC

## 2013-07-12 MED ORDER — ONDANSETRON HCL 4 MG/2ML IJ SOLN
4.0000 mg | Freq: Four times a day (QID) | INTRAMUSCULAR | Status: DC | PRN
  Administered 2013-07-14 – 2013-07-15 (×4): 4 mg via INTRAVENOUS
  Filled 2013-07-12 (×4): qty 2

## 2013-07-12 MED ORDER — SODIUM CHLORIDE 0.9 % IV SOLN
Freq: Once | INTRAVENOUS | Status: AC
  Administered 2013-07-12: 18:00:00 via INTRAVENOUS

## 2013-07-12 MED ORDER — MORPHINE SULFATE 4 MG/ML IJ SOLN
4.0000 mg | Freq: Once | INTRAMUSCULAR | Status: AC
  Administered 2013-07-12: 4 mg via INTRAVENOUS
  Filled 2013-07-12: qty 1

## 2013-07-12 MED ORDER — DEXTROSE 5 % IV SOLN
1.0000 g | Freq: Once | INTRAVENOUS | Status: AC
  Administered 2013-07-12: 1 g via INTRAVENOUS
  Filled 2013-07-12: qty 10

## 2013-07-12 MED ORDER — METRONIDAZOLE IN NACL 5-0.79 MG/ML-% IV SOLN
500.0000 mg | Freq: Three times a day (TID) | INTRAVENOUS | Status: DC
  Administered 2013-07-12 – 2013-07-13 (×3): 500 mg via INTRAVENOUS
  Filled 2013-07-12 (×5): qty 100

## 2013-07-12 MED ORDER — ACETAMINOPHEN 325 MG PO TABS
650.0000 mg | ORAL_TABLET | Freq: Four times a day (QID) | ORAL | Status: DC | PRN
  Administered 2013-07-15 (×2): 650 mg via ORAL
  Filled 2013-07-12 (×2): qty 2

## 2013-07-12 NOTE — ED Notes (Signed)
Patient transported to X-ray 

## 2013-07-12 NOTE — ED Notes (Signed)
Iv attempt unsuccessful x2. IV team paged. Dr. Randa Spike aware.

## 2013-07-12 NOTE — ED Notes (Signed)
Has had diarrhea x 3 days has had sob x 2 days feels weak

## 2013-07-12 NOTE — ED Notes (Signed)
Pt returned from xray. Placed back on monitor. VSS.  

## 2013-07-12 NOTE — ED Notes (Signed)
1231. Meds given IM in right upper thigh, per pt request. Ok'd with MD

## 2013-07-12 NOTE — ED Notes (Signed)
Pt reports nausea is persisting. Pt asking for phenergan. Dr. Randa Spike aware.

## 2013-07-12 NOTE — ED Notes (Signed)
Critical potassium <2 given to Dr Randa Spike

## 2013-07-12 NOTE — H&P (Addendum)
[facility] History and Physical  Danielle [name] QIO:[id number] DOB: [birth year] DOA: 07/12/2013  Referring physician: EDP PCP: [name], MD   Chief Complaint: vomiting, diarrhea, and muscle cramps  HPI: Danielle [name] is a 67 y.o. female who presents with a several day h/o N/V/D. Able to keep down water to some extent, but vomits up pills.  Too numerous to count watery stools.  Mild dyspepsia. No f/c. No hematochezia or hematemesis.  H/o crohns, but this is not typical for her usual crohn's flare. Takes Remicaide. Hospitalized a few months ago for pneumonia. No sick contacts or travel.  Drinks bottled water.  Cramping in hands and muscles.  Potassium found to be less than 2. Creatinine higher than baseline at 1.9. CT abd and pelvis show chronic findings without evidence of acute process.  Review of Systems: systems reviewed. As above, otherwise negative.  Past Medical History  Diagnosis Date  . Crohn's disease   . Chronic diarrhea   . Joint pain   . GERD (gastroesophageal reflux disease)   . Polyarthralgia   . Shortness of breath   . Pneumonia   . Anemia   . Hernia (acquired) (recurrent)     hiatal  . Hiatal hernia   . Back pain   . Hemorrhoids   . Anemia    Past Surgical History  Procedure Laterality Date  . Bowel resection    . Colostomy      [name]  . Anal stenosis    . Colonoscopy  2000    needs to have one  . Cholecystectomy    . Appendectomy    . Goiter  1999  . Esophagogastroduodenoscopy  11/11/2011    Procedure: ESOPHAGOGASTRODUODENOSCOPY (EGD);  Surgeon: Barrie Folk, MD;  Location: [facility] ENDOSCOPY;  Service: Endoscopy;  Laterality: N/A;  . Flexible sigmoidoscopy  11/11/2011    Procedure: FLEXIBLE SIGMOIDOSCOPY;  Surgeon: Barrie Folk, MD;  Location: [facility] ENDOSCOPY;  Service: Endoscopy;  Laterality: N/A;  . Esophagogastroduodenoscopy  05/18/2012    Procedure: ESOPHAGOGASTRODUODENOSCOPY (EGD);  Surgeon: Malissa Hippo, MD;  Location: [facility] ENDO SUITE;   Service: Endoscopy;  Laterality: N/A;  200   Social History:  reports that she quit smoking about 16 years ago. Her smoking use included Cigarettes. She has a 1 pack-year smoking history. She has never used smokeless tobacco. She reports that she does not drink alcohol or use illicit drugs.  Allergies  Allergen Reactions  . Aspirin Other (See Comments)    Cramps her stomach  . Sulfa Antibiotics Nausea And Vomiting    Family History  Problem Relation Age of Onset  . Diabetes Mother   . Healthy Sister   . Hypertension Brother   . Colon cancer Brother     died with colon cancer    Prior to Admission medications   Medication Sig Start Date End Date Taking? Authorizing Provider  Calcium Carbonate-Vitamin D (CALCIUM + D PO) Take 1 tablet by mouth daily.   Yes Historical Provider, MD  dicyclomine (BENTYL) 10 MG/5ML syrup Take 20 mg by mouth 4 (four) times daily -  before meals and at bedtime.   Yes Historical Provider, MD  HYDROcodone-acetaminophen (NORCO/VICODIN) 5-325 MG per tablet Take 1 tablet by mouth 2 (two) times daily as needed for pain. 06/14/13  Yes Malissa Hippo, MD  Multiple Vitamin (MULTIVITAMIN WITH MINERALS) TABS Take 1 tablet by mouth daily.   Yes Historical Provider, MD  nystatin-triamcinolone (MYCOLOG II) cream Apply 1 application topically 3 (three) times daily  as needed (irritation).   Yes Historical Provider, MD  pantoprazole (PROTONIX) 40 MG tablet Take 40 mg by mouth 2 (two) times daily as needed (acid reflex).   Yes Historical Provider, MD  potassium chloride SA (K-DUR,KLOR-CON) 20 MEQ tablet Take 1 tablet (20 mEq total) by mouth 2 (two) times daily. 02/27/13  Yes Nishant Dhungel, MD  spironolactone (ALDACTONE) 50 MG tablet Take 50 mg by mouth daily.  01/10/13  Yes Historical Provider, MD  albuterol (PROVENTIL HFA;VENTOLIN HFA) 108 (90 BASE) MCG/ACT inhaler Inhale 2 puffs into the lungs every 6 (six) hours as needed for wheezing. 02/27/13   Nishant Dhungel, MD   Cyanocobalamin (VITAMIN B-12 IJ) Inject 1 each as directed every 30 (thirty) days.    Historical Provider, MD   Physical Exam: Filed Vitals:   07/12/13 2041  BP: 129/76  Pulse: 84  Temp: 97.8 F (36.6 C)  Resp: 20      BP 129/76  Pulse 84  Temp(Src) 97.8 F (36.6 C) (Oral)  Resp 20  Ht 5' 4.5" (1.638 m)  Wt 59.421 kg (131 lb)  BMI 22.15 kg/m2  SpO2 100%  General Appearance:    Alert, cooperative, frail  Head:    Normocephalic, without obvious abnormality, atraumatic  Eyes:    PERRL, conjunctiva/corneas clear, EOM's intact, fundi    benign, both eyes  Ears:    Normal TM's and external ear canals, both ears  Nose:   Nares normal, septum midline, mucosa normal, no drainage    or sinus tenderness  Throat:   Dry mucous membranes  Neck:   Supple, symmetrical, trachea midline, no adenopathy;    thyroid:  no enlargement/tenderness/nodules; no carotid   bruit or JVD  Back:     Symmetric, no curvature, ROM normal, no CVA tenderness  Lungs:     Clear to auscultation bilaterally, respirations unlabored  Chest Wall:    No tenderness or deformity   Heart:    Regular rate and rhythm, S1 and S2 normal, no murmur, rub   or gallop     Abdomen:     Soft, non-tender, bowel sounds active all four quadrants,    no masses, no organomegaly  Genitalia:    deferred  Rectal:    deferred  Extremities:   Extremities normal, atraumatic, no cyanosis or edema  Pulses:   2+ and symmetric all extremities  Skin:   Skin color, texture, turgor normal, no rashes or lesions  Lymph nodes:   Cervical, supraclavicular, and axillary nodes normal  Neurologic:   CNII-XII intact, normal strength, sensation and reflexes    throughout    Psych:  Normal affect  Labs on Admission:  Basic Metabolic Panel:  Recent Labs Lab 07/12/13 1202 07/12/13 1541  NA 134*  --   K <2.0* <2.0*  CL 103  --   CO2 11*  --   GLUCOSE 95  --   BUN 26*  --   CREATININE 1.92*  --   CALCIUM 8.0*  --   MG  --  1.3*    Liver Function Tests:  Recent Labs Lab 07/12/13 1202  AST 28  ALT 26  ALKPHOS 87  BILITOT 0.5  PROT 7.8  ALBUMIN 3.3*    Recent Labs Lab 07/12/13 1202  LIPASE 61*   No results found for this basename: AMMONIA,  in the last 168 hours CBC:  Recent Labs Lab 07/12/13 1202  WBC 10.3  NEUTROABS 8.4*  HGB 13.2  HCT 36.6  MCV 78.0  PLT 381  Cardiac Enzymes:  Recent Labs Lab 07/12/13 1202  TROPONINI <0.30    BNP (last 3 results)  Recent Labs  02/24/13 1625 04/28/13 2305  PROBNP 511.1* 215.7*   CBG: No results found for this basename: GLUCAP,  in the last 168 hours  Radiological Exams on Admission: Ct Abdomen Pelvis Wo Contrast  07/12/2013   *RADIOLOGY REPORT*  Clinical Data: Right-sided abdominal pain with nausea and diarrhea. Prior cholecystectomy and appendectomy.  History of Crohn's disease.  CT ABDOMEN AND PELVIS WITHOUT CONTRAST  Technique:  Multidetector CT imaging of the abdomen and pelvis was performed following the standard protocol without intravenous contrast.  Comparison: 12/26/2012  Findings: A very large hiatal hernia is noted and contains proximally to thirds of the stomach and a segment of the transverse colon.  Although incompletely visualized, there is no evidence for edema or inflammation within the hernia sac.  Liver and spleen have normal uninfused features.  The duodenum, pancreas, and adrenal glands are unremarkable.  Gallbladder is surgically absent.  No stones are seen in either kidney.  No secondary changes in either kidney or ureter.  No abdominal aortic aneurysm.  No lymphadenopathy in the abdomen. No evidence for intraperitoneal free fluid.  Imaging through the pelvis shows no intraperitoneal free fluid. There is no pelvic sidewall lymphadenopathy.  There is an abnormal tract of soft tissue beginning in the region of the anal verge and tracking posterolaterally to the right into the ischiorectal fat.  Imaging features could be related to a  blind ending fistula or linear abscess.  Abnormal soft tissue to the left of the anus may be related to a fistula or scar related to an old perianal fistula.  Bone windows reveal no worrisome lytic or sclerotic osseous lesions.  IMPRESSION: Linear bands of soft tissue attenuation tracking to the right of the rectum and left of the anus.  On the right, this could be a a linear chronic perirectal linear abscess.  On the left, a peri-anal fistula or scarring from previous fistula could have this appearance.   Original Report Authenticated By: Kennith Center, M.D.   Dg Chest 2 View  07/12/2013   *RADIOLOGY REPORT*  Clinical Data: Shortness of breath, right-sided abdominal pain and diarrhea  CHEST - 2 VIEW  Comparison: Chest x-ray of 04/28/2013  Findings: The lungs remain clear.  The heart is within normal limits in size.  A moderate sized hiatal hernia again is noted.  An old right posterolateral seventh rib fracture is noted.  IMPRESSION: No active lung disease.  Moderate sized hiatal hernia remains.   Original Report Authenticated By: Dwyane Dee, M.D.    EKG: NSR. LVH with strain  Assessment/Plan  Principal Problem:   Nausea vomiting and diarrhea: not typical of crohns flare.  Suspect infectious etiology.  Check c diff, culture, ova, parasite. Empiric cipro and flagyl.  IVF. Clear liquids. antiemetics Active Problems:   Hypokalemia, severe:  Secondary to above.  Replete IV. Monitor on telemetry   Dehydration: IVF   Metabolic acidosis secondary to above. No bicarb for now, as will worsen hypokalemia.  Lactate ok   Hypomagnesemia: replete IV   Crohn's disease: stable on remicaide   GERD (gastroesophageal reflux disease)   Acute renal failure secondary to above.  Prerenal UA contaminated and no urinary symptoms  Code Status: full Family Communication: sister at bedside Disposition Plan: home  Time spent: 60 minutes  Christiane Ha [facility] Pager [phone]  If 7PM-7AM, please  contact night-coverage [email or web address] Password [facility] 07/12/2013, 9:26  PM

## 2013-07-12 NOTE — ED Notes (Signed)
IV team returned page.  

## 2013-07-12 NOTE — ED Notes (Signed)
IV team at the bedside. 

## 2013-07-12 NOTE — ED Notes (Signed)
Pt ambulatory to bathroom with staff assistance.

## 2013-07-12 NOTE — ED Notes (Signed)
Phelbotomy paged to notify need for lab draw. They state they will be over shortly.

## 2013-07-12 NOTE — ED Notes (Signed)
Back fm CT

## 2013-07-12 NOTE — ED Provider Notes (Signed)
CSN: [id number]     Arrival date & time 07/12/13  1115 History     First MD Initiated Contact with Patient 07/12/13 1128     Chief Complaint  Patient presents with  . Shortness of Breath  . Diarrhea   (Consider location/radiation/quality/duration/timing/severity/associated sxs/prior Treatment) Patient is a 67 y.o. female presenting with shortness of breath and diarrhea. The history is provided by the patient.  Shortness of Breath Severity:  Mild Onset quality:  Gradual Duration:  2 days Timing:  Constant Progression:  Unchanged Relieved by:  Nothing Worsened by:  Nothing tried Ineffective treatments:  None tried Associated symptoms: abdominal pain and vomiting   Associated symptoms: no chest pain, no cough, no fever, no headaches and no neck pain   Abdominal pain:    Location:  RUQ and RLQ   Quality:  Aching   Severity:  Mild   Onset quality:  Gradual   Duration:  3 days   Timing:  Constant   Progression:  Unchanged   Chronicity:  New Diarrhea Associated symptoms: abdominal pain and vomiting   Associated symptoms: no fever and no headaches     Past Medical History  Diagnosis Date  . Crohn's disease   . Chronic diarrhea   . Joint pain   . GERD (gastroesophageal reflux disease)   . Polyarthralgia   . Shortness of breath   . Pneumonia   . Anemia   . Hernia (acquired) (recurrent)     hiatal  . Hiatal hernia   . Back pain   . Hemorrhoids   . Anemia    Past Surgical History  Procedure Laterality Date  . Bowel resection    . Colostomy      [name]  . Anal stenosis    . Colonoscopy  2000    needs to have one  . Cholecystectomy    . Appendectomy    . Goiter  1999  . Esophagogastroduodenoscopy  11/11/2011    Procedure: ESOPHAGOGASTRODUODENOSCOPY (EGD);  Surgeon: Barrie Folk, MD;  Location: [facility] ENDOSCOPY;  Service: Endoscopy;  Laterality: N/A;  . Flexible sigmoidoscopy  11/11/2011    Procedure: FLEXIBLE SIGMOIDOSCOPY;  Surgeon: Barrie Folk, MD;  Location: [facility]  ENDOSCOPY;  Service: Endoscopy;  Laterality: N/A;  . Esophagogastroduodenoscopy  05/18/2012    Procedure: ESOPHAGOGASTRODUODENOSCOPY (EGD);  Surgeon: Malissa Hippo, MD;  Location: [facility] ENDO SUITE;  Service: Endoscopy;  Laterality: N/A;  200   Family History  Problem Relation Age of Onset  . Diabetes Mother   . Healthy Sister   . Hypertension Brother   . Colon cancer Brother     died with colon cancer   History  Substance Use Topics  . Smoking status: Former Smoker -- 0.25 packs/day for 4 years    Types: Cigarettes    Quit date: 10/05/1996  . Smokeless tobacco: Never Used     Comment: 1 pack every two weeks when she smoked  . Alcohol Use: No     Comment: denies   OB History   Grav Para Term Preterm Abortions TAB SAB Ect Mult Living                 Review of Systems  Constitutional: Negative for fever and fatigue.  HENT: Negative for congestion, drooling and neck pain.   Eyes: Negative for pain.  Respiratory: Positive for shortness of breath. Negative for cough.   Cardiovascular: Negative for chest pain.  Gastrointestinal: Positive for nausea, vomiting, abdominal pain and diarrhea. Negative for blood in  stool.  Genitourinary: Negative for dysuria and hematuria.  Musculoskeletal: Negative for back pain and gait problem.  Skin: Negative for color change.  Neurological: Negative for dizziness and headaches.  Hematological: Negative for adenopathy.  Psychiatric/Behavioral: Negative for behavioral problems.  All other systems reviewed and are negative.    Allergies  Aspirin and Sulfa antibiotics  Home Medications   Current Outpatient Rx  Name  Route  Sig  Dispense  Refill  . Calcium Carbonate-Vitamin D (CALCIUM + D PO)   Oral   Take 1 tablet by mouth daily.         [name] dicyclomine (BENTYL) 10 MG/5ML syrup   Oral   Take 20 mg by mouth 4 (four) times daily -  before meals and at bedtime.         [name] HYDROcodone-acetaminophen (NORCO/VICODIN) 5-325 MG per tablet    Oral   Take 1 tablet by mouth 2 (two) times daily as needed for pain.   40 tablet   0   . Multiple Vitamin (MULTIVITAMIN WITH MINERALS) TABS   Oral   Take 1 tablet by mouth daily.         [name] nystatin-triamcinolone (MYCOLOG II) cream   Topical   Apply 1 application topically 3 (three) times daily as needed (irritation).         . pantoprazole (PROTONIX) 40 MG tablet   Oral   Take 40 mg by mouth 2 (two) times daily as needed (acid reflex).         . potassium chloride SA (K-DUR,KLOR-CON) 20 MEQ tablet   Oral   Take 1 tablet (20 mEq total) by mouth 2 (two) times daily.   30 tablet   2   . spironolactone (ALDACTONE) 50 MG tablet   Oral   Take 50 mg by mouth daily.          [name] albuterol (PROVENTIL HFA;VENTOLIN HFA) 108 (90 BASE) MCG/ACT inhaler   Inhalation   Inhale 2 puffs into the lungs every 6 (six) hours as needed for wheezing.   1 Inhaler   2   . Cyanocobalamin (VITAMIN B-12 IJ)   Injection   Inject 1 each as directed every 30 (thirty) days.          BP 98/92  Pulse 90  Temp(Src) 97.7 F (36.5 C) (Oral)  Resp 20  SpO2 100% Physical Exam  Nursing note and vitals reviewed. Constitutional: She is oriented to person, place, and time. She appears well-developed and well-nourished.  HENT:  Head: Normocephalic.  Mouth/Throat: No oropharyngeal exudate.  Eyes: Conjunctivae and EOM are normal. Pupils are equal, round, and reactive to light.  Neck: Normal range of motion. Neck supple.  Cardiovascular: Normal rate, regular rhythm, normal heart sounds and intact distal pulses.  Exam reveals no gallop and no friction rub.   No murmur heard. Pulmonary/Chest: Effort normal and breath sounds normal. No respiratory distress. She has no wheezes.  Abdominal: Soft. Bowel sounds are normal. There is tenderness (mild ttp of RUQ, RLQ). There is no rebound and no guarding.  Musculoskeletal: Normal range of motion. She exhibits no edema and no tenderness.  Neurological: She is  alert and oriented to person, place, and time.  Skin: Skin is warm and dry.  Psychiatric: She has a normal mood and affect. Her behavior is normal.    ED Course   Procedures (including critical care time)  Labs Reviewed  CBC WITH DIFFERENTIAL - Abnormal; Notable for the following:    MCHC 36.1 (*)  RDW 16.2 (*)    Neutrophils Relative % 81 (*)    Neutro Abs 8.4 (*)    All other components within normal limits  COMPREHENSIVE METABOLIC PANEL - Abnormal; Notable for the following:    Sodium 134 (*)    Potassium <2.0 (*)    CO2 11 (*)    BUN 26 (*)    Creatinine, Ser 1.92 (*)    Calcium 8.0 (*)    Albumin 3.3 (*)    GFR calc non Af Amer 26 (*)    GFR calc Af Amer 30 (*)    All other components within normal limits  LIPASE, BLOOD - Abnormal; Notable for the following:    Lipase 61 (*)    All other components within normal limits  URINALYSIS, ROUTINE W REFLEX MICROSCOPIC - Abnormal; Notable for the following:    APPearance CLOUDY (*)    Hgb urine dipstick MODERATE (*)    Protein, ur 30 (*)    Leukocytes, UA LARGE (*)    All other components within normal limits  MAGNESIUM - Abnormal; Notable for the following:    Magnesium 1.3 (*)    All other components within normal limits  POTASSIUM - Abnormal; Notable for the following:    Potassium <2.0 (*)    All other components within normal limits  URINE MICROSCOPIC-ADD ON - Abnormal; Notable for the following:    Squamous Epithelial / LPF MANY (*)    All other components within normal limits  BASIC METABOLIC PANEL - Abnormal; Notable for the following:    Potassium <2.0 (*)    CO2 9 (*)    Creatinine, Ser 1.49 (*)    Calcium 7.5 (*)    GFR calc non Af Amer 36 (*)    GFR calc Af Amer 41 (*)    All other components within normal limits  CBC WITH DIFFERENTIAL - Abnormal; Notable for the following:    WBC 16.0 (*)    HCT 33.2 (*)    MCV 77.6 (*)    MCHC 36.7 (*)    RDW 16.2 (*)    Neutro Abs 11.9 (*)    Monocytes Absolute  1.4 (*)    All other components within normal limits  MRSA PCR SCREENING  URINE CULTURE  CLOSTRIDIUM DIFFICILE BY PCR  STOOL CULTURE  OVA AND PARASITE EXAMINATION  LACTIC ACID, PLASMA  TROPONIN I  LIPASE, BLOOD  BASIC METABOLIC PANEL   Ct Abdomen Pelvis Wo Contrast  07/12/2013   *RADIOLOGY REPORT*  Clinical Data: Right-sided abdominal pain with nausea and diarrhea. Prior cholecystectomy and appendectomy.  History of Crohn's disease.  CT ABDOMEN AND PELVIS WITHOUT CONTRAST  Technique:  Multidetector CT imaging of the abdomen and pelvis was performed following the standard protocol without intravenous contrast.  Comparison: 12/26/2012  Findings: A very large hiatal hernia is noted and contains proximally to thirds of the stomach and a segment of the transverse colon.  Although incompletely visualized, there is no evidence for edema or inflammation within the hernia sac.  Liver and spleen have normal uninfused features.  The duodenum, pancreas, and adrenal glands are unremarkable.  Gallbladder is surgically absent.  No stones are seen in either kidney.  No secondary changes in either kidney or ureter.  No abdominal aortic aneurysm.  No lymphadenopathy in the abdomen. No evidence for intraperitoneal free fluid.  Imaging through the pelvis shows no intraperitoneal free fluid. There is no pelvic sidewall lymphadenopathy.  There is an abnormal tract of soft tissue beginning  in the region of the anal verge and tracking posterolaterally to the right into the ischiorectal fat.  Imaging features could be related to a blind ending fistula or linear abscess.  Abnormal soft tissue to the left of the anus may be related to a fistula or scar related to an old perianal fistula.  Bone windows reveal no worrisome lytic or sclerotic osseous lesions.  IMPRESSION: Linear bands of soft tissue attenuation tracking to the right of the rectum and left of the anus.  On the right, this could be a a linear chronic perirectal linear  abscess.  On the left, a peri-anal fistula or scarring from previous fistula could have this appearance.   Original Report Authenticated By: Kennith Center, M.D.   Dg Chest 2 View  07/12/2013   *RADIOLOGY REPORT*  Clinical Data: Shortness of breath, right-sided abdominal pain and diarrhea  CHEST - 2 VIEW  Comparison: Chest x-ray of 04/28/2013  Findings: The lungs remain clear.  The heart is within normal limits in size.  A moderate sized hiatal hernia again is noted.  An old right posterolateral seventh rib fracture is noted.  IMPRESSION: No active lung disease.  Moderate sized hiatal hernia remains.   Original Report Authenticated By: Dwyane Dee, M.D.   1. Renal insufficiency   2. Long QT interval   3. Hypokalemia   4. Low blood magnesium level   5. Gastroenteritis   6. UTI (lower urinary tract infection)   7. Metabolic acidosis   8. Dehydration   9. Abdominal  pain, other specified site   10. Shortness of breath   11. Acute renal failure   12. Hypomagnesemia   13. Nausea vomiting and diarrhea   14. Crohn's disease, unspecified complication      Date: 07/12/2013  Rate: 94  Rhythm: normal sinus rhythm  QRS Axis: normal  Intervals: QT prolonged Qtc 605  ST/T Wave abnormalities: nonspecific ST/T changes  Conduction Disutrbances:none  Narrative Interpretation: LVH w/ repolarization abnormality, downsloping ST in I and aVL similar to previous, up sloping ST isolated in lead III and then V1-V2 also similar to previous, No new ST or T wave changes cw ischemia  Old EKG Reviewed: changes noted   Date: 07/12/2013  Rate: 87  Rhythm: normal sinus rhythm  QRS Axis: normal  Intervals: QT prolonged Qtc 576  ST/T Wave abnormalities: nonspecific ST/T changes  Conduction Disutrbances:none  Narrative Interpretation: Qtc is shorter, otherwise unchanged except for increased artifact  Old EKG Reviewed: changes noted     MDM  12:03 PM 67 y.o. female w hx of crohns, admitted 04/29/13 for HCAP pw  mild sob, abd pain, N/V/D. Pt denies bloody stools. AFVSS here. Mild ttp of abd, which is soft. Not cw crohns flare per pt. Will tx sx, CT abd to r/o acute process.   I avoided repeat doses of anti-emetics d/t long QT on ecg. This was explained to the pt and she understands.   CT showing likely chronic linear peri-rectal abscess and possible peri-anal fistula vs scarring. As pt's sx not cw crohns flare, not febrile, no leukocytosis - doubt this is acute process, but hospitalist notified of its presence.     Junius Argyle, MD 07/13/13 [id number]

## 2013-07-12 NOTE — ED Notes (Signed)
Critical potassium 2.0 given to Dr Sung Amabile

## 2013-07-13 ENCOUNTER — Encounter (HOSPITAL_COMMUNITY): Payer: Self-pay | Admitting: General Practice

## 2013-07-13 DIAGNOSIS — N179 Acute kidney failure, unspecified: Secondary | ICD-10-CM

## 2013-07-13 DIAGNOSIS — E876 Hypokalemia: Secondary | ICD-10-CM

## 2013-07-13 DIAGNOSIS — E872 Acidosis: Secondary | ICD-10-CM

## 2013-07-13 DIAGNOSIS — N390 Urinary tract infection, site not specified: Secondary | ICD-10-CM

## 2013-07-13 LAB — CBC WITH DIFFERENTIAL/PLATELET
Basophils Relative: 0 % (ref 0–1)
Hemoglobin: 12.2 g/dL (ref 12.0–15.0)
Lymphs Abs: 2.7 10*3/uL (ref 0.7–4.0)
MCHC: 36.7 g/dL — ABNORMAL HIGH (ref 30.0–36.0)
Monocytes Relative: 9 % (ref 3–12)
Neutro Abs: 11.9 10*3/uL — ABNORMAL HIGH (ref 1.7–7.7)
Neutrophils Relative %: 74 % (ref 43–77)
Platelets: 364 10*3/uL (ref 150–400)
RBC: 4.28 MIL/uL (ref 3.87–5.11)

## 2013-07-13 LAB — BASIC METABOLIC PANEL
CO2: 12 mEq/L — ABNORMAL LOW (ref 19–32)
Calcium: 7.5 mg/dL — ABNORMAL LOW (ref 8.4–10.5)
Calcium: 7 mg/dL — ABNORMAL LOW (ref 8.4–10.5)
Creatinine, Ser: 1.3 mg/dL — ABNORMAL HIGH (ref 0.50–1.10)
GFR calc Af Amer: 41 mL/min — ABNORMAL LOW (ref >90)
GFR calc Af Amer: 49 mL/min — ABNORMAL LOW (ref >90)
GFR calc non Af Amer: 36 mL/min — ABNORMAL LOW (ref >90)
Potassium: <2 mEq/L — CL (ref 3.5–5.1)
Sodium: 136 mEq/L (ref 135–145)
Sodium: 134 mEq/L — ABNORMAL LOW (ref 135–145)

## 2013-07-13 LAB — MRSA PCR SCREENING: MRSA by PCR: NEGATIVE

## 2013-07-13 LAB — LIPASE, BLOOD: Lipase: 42 U/L (ref 11–59)

## 2013-07-13 MED ORDER — POTASSIUM CHLORIDE 10 MEQ/100ML IV SOLN
10.0000 meq | INTRAVENOUS | Status: AC
  Administered 2013-07-13 (×5): 10 meq via INTRAVENOUS
  Filled 2013-07-13 (×5): qty 100

## 2013-07-13 MED ORDER — HYDROCODONE-ACETAMINOPHEN 5-325 MG PO TABS: 1.0000 | ORAL_TABLET | Freq: Four times a day (QID) | ORAL | Status: DC | PRN

## 2013-07-13 MED ORDER — SODIUM BICARBONATE 8.4 % IV SOLN: 150.0000 meq | Freq: Once | INTRAVENOUS | Status: DC

## 2013-07-13 MED ORDER — STERILE WATER FOR INJECTION IV SOLN
INTRAVENOUS | Status: DC
  Administered 2013-07-13 – 2013-07-15 (×6): via INTRAVENOUS
  Filled 2013-07-13 (×11): qty 830

## 2013-07-13 MED ORDER — SPIRONOLACTONE 50 MG PO TABS
50.0000 mg | ORAL_TABLET | Freq: Every day | ORAL | Status: DC
  Administered 2013-07-13 – 2013-07-15 (×3): 50 mg via ORAL
  Filled 2013-07-13 (×4): qty 1

## 2013-07-13 MED ORDER — DICYCLOMINE HCL 10 MG/5ML PO SOLN
20.0000 mg | Freq: Three times a day (TID) | ORAL | Status: DC
  Filled 2013-07-13 (×15): qty 10

## 2013-07-13 MED ORDER — MAGNESIUM SULFATE 40 MG/ML IJ SOLN
2.0000 g | Freq: Once | INTRAMUSCULAR | Status: AC
  Administered 2013-07-13: 2 g via INTRAVENOUS
  Filled 2013-07-13: qty 50

## 2013-07-13 MED ORDER — DIPHENOXYLATE-ATROPINE 2.5-0.025 MG PO TABS
2.0000 | ORAL_TABLET | Freq: Four times a day (QID) | ORAL | Status: DC | PRN
  Administered 2013-07-13 – 2013-07-16 (×5): 2 via ORAL
  Filled 2013-07-13 (×6): qty 2

## 2013-07-13 MED ORDER — BOOST / RESOURCE BREEZE PO LIQD
1.0000 | Freq: Two times a day (BID) | ORAL | Status: DC
  Administered 2013-07-13 – 2013-07-14 (×2): 1 via ORAL

## 2013-07-13 MED ORDER — CALCIUM CARBONATE-VITAMIN D 500-200 MG-UNIT PO TABS
1.0000 | ORAL_TABLET | Freq: Every day | ORAL | Status: DC
  Administered 2013-07-13 – 2013-07-14 (×2): 1 via ORAL
  Filled 2013-07-13 (×4): qty 1

## 2013-07-13 MED ORDER — POTASSIUM CHLORIDE 10 MEQ/100ML IV SOLN
10.0000 meq | Freq: Once | INTRAVENOUS | Status: AC
  Administered 2013-07-13: 10 meq via INTRAVENOUS

## 2013-07-13 MED ORDER — POTASSIUM CHLORIDE CRYS ER 20 MEQ PO TBCR
40.0000 meq | EXTENDED_RELEASE_TABLET | ORAL | Status: AC
  Administered 2013-07-13: 40 meq via ORAL
  Filled 2013-07-13: qty 2

## 2013-07-13 MED ORDER — SODIUM CHLORIDE 0.9 % IJ SOLN
10.0000 mL | INTRAMUSCULAR | Status: DC | PRN
  Administered 2013-07-13: 20 mL

## 2013-07-13 MED ORDER — PANTOPRAZOLE SODIUM 40 MG PO TBEC
40.0000 mg | DELAYED_RELEASE_TABLET | Freq: Two times a day (BID) | ORAL | Status: DC | PRN
  Filled 2013-07-13: qty 1

## 2013-07-13 MED ORDER — NONFORMULARY OR COMPOUNDED ITEM
Status: DC
  Filled 2013-07-13: qty 1

## 2013-07-13 MED ORDER — SODIUM CHLORIDE 0.9 % IV SOLN: INTRAVENOUS | Status: DC

## 2013-07-13 MED ORDER — POTASSIUM CHLORIDE 10 MEQ/100ML IV SOLN
10.0000 meq | INTRAVENOUS | Status: AC
  Administered 2013-07-13 (×6): 10 meq via INTRAVENOUS
  Filled 2013-07-13 (×6): qty 100

## 2013-07-13 MED ORDER — ADULT MULTIVITAMIN W/MINERALS CH
1.0000 | ORAL_TABLET | Freq: Every day | ORAL | Status: DC
  Administered 2013-07-13 – 2013-07-14 (×2): 1 via ORAL
  Filled 2013-07-13 (×4): qty 1

## 2013-07-13 MED ORDER — ZOLPIDEM TARTRATE 5 MG PO TABS
5.0000 mg | ORAL_TABLET | Freq: Once | ORAL | Status: AC
Start: 2013-07-13 — End: 2013-07-13
  Administered 2013-07-13: 5 mg via ORAL
  Filled 2013-07-13: qty 1

## 2013-07-13 MED ORDER — CALCIUM CARBONATE-VITAMIN D 250-125 MG-UNIT PO TABS: 1.0000 | ORAL_TABLET | Freq: Every day | ORAL | Status: DC

## 2013-07-13 MED ORDER — ALBUTEROL SULFATE HFA 108 (90 BASE) MCG/ACT IN AERS
2.0000 | INHALATION_SPRAY | Freq: Four times a day (QID) | RESPIRATORY_TRACT | Status: DC | PRN
Start: 2013-07-13 — End: 2013-07-16
  Filled 2013-07-13: qty 6.7

## 2013-07-13 NOTE — Progress Notes (Signed)
Patient arrived to the floor via stretcher from ED.  She is having severe cramps to bilateral arms and legs.  A&Ox4.  Sacrum is slightly red but blanchable from frequent diarrhea.  She has chronic diarrhea from IBS and Crohn's.  Explained to patient and family that she was going to be placed on Contact Precaution due to r/o CDIFF and r/o MRSA.  Also, explained patient was considered high fall risk and they understood and agreed to the fall precautions.  Oriented to unit.  Pinki Rottman RN, Justine Null

## 2013-07-13 NOTE — Progress Notes (Signed)
INITIAL NUTRITION ASSESSMENT  DOCUMENTATION CODES Per approved criteria  -Not Applicable   INTERVENTION: 1. Resource Breeze po BID, each supplement provides 250 kcal and 9 grams of protein.   NUTRITION DIAGNOSIS: Inadequate oral intake related to decreased appetite as evidenced by pt reported weight loss.   Goal: PO intake to meet >/=90% estimated nutrition needs.   Monitor:  PO intake, diet advance, weight trends, labs, I/O's   Reason for Assessment: Malnutrition Screening Tool  67 y.o. female  Admitting Dx: Nausea vomiting and diarrhea  ASSESSMENT: Pt admitted with N/V/D for several days. Pt  Having cramps in hands and arms, K+ found to be below 2. Pt with hx of IBS and Crohn's, but current process not consistent with typical flares.   Pt reports about one week of eating <50% of usual intake. Mostly soft foods or liquids. Given frequent diarrhea, likely with malabsorption of what little she was able to tolerate.  Weight hx shows 23 lb weight loss in the past 3 months. Weight prior to that was more consistently about 135 lbs, near current weight.   Height: Ht Readings from Last 1 Encounters:  07/12/13 5' 4.5" (1.638 m)    Weight: Wt Readings from Last 1 Encounters:  07/12/13 131 lb (59.421 kg)    Ideal Body Weight: 123 lbs   % Ideal Body Weight: 106%  Wt Readings from Last 10 Encounters:  07/12/13 131 lb (59.421 kg)  06/14/13 139 lb 3.2 oz (63.141 kg)  05/22/13 149 lb (67.586 kg)  04/30/13 154 lb 8.7 oz (70.1 kg)  04/11/13 141 lb 1.5 oz (64 kg)  03/28/13 134 lb (60.782 kg)  03/21/13 134 lb 4.8 oz (60.918 kg)  02/27/13 129 lb 6.6 oz (58.7 kg)  01/23/13 127 lb 4.8 oz (57.743 kg)  12/25/12 135 lb 5.8 oz (61.4 kg)    Usual Body Weight: 145 lbs per pt report (weight hx shows variable weights)  % Usual Body Weight: 90%  BMI:  Body mass index is 22.15 kg/(m^2). WNL   Estimated Nutritional Needs: Kcal: 1450-1700 Protein: 60-70 gm  Fluid: 1.5-1.7 L   Skin:  intact   Diet Order: Clear Liquid  EDUCATION NEEDS: -No education needs identified at this time   Intake/Output Summary (Last 24 hours) at 07/13/13 1136 Last data filed at 07/13/13 0937  Gross per 24 hour  Intake 2447.08 ml  Output    900 ml  Net 1547.08 ml    Last BM: 8/6   Labs:   Recent Labs Lab 07/12/13 1202 07/12/13 1541 07/13/13 0502  NA 134*  --  136  K <2.0* <2.0* <2.0*  CL 103  --  112  CO2 11*  --  9*  BUN 26*  --  19  CREATININE 1.92*  --  1.49*  CALCIUM 8.0*  --  7.5*  MG  --  1.3*  --   GLUCOSE 95  --  87    CBG (last 3)  No results found for this basename: GLUCAP,  in the last 72 hours  Scheduled Meds: . ciprofloxacin  400 mg Intravenous Q12H  . metronidazole  500 mg Intravenous Q8H  . potassium chloride  10 mEq Intravenous Q1 Hr x 6  . potassium chloride  40 mEq Oral Q4H  . sodium chloride  3 mL Intravenous Q12H    Continuous Infusions: . sterile water with sodium bicarbonate 150 mEq, potassium chloride ( ) 125 mL/hr at 07/13/13 4098    Past Medical History  Diagnosis Date  .  Crohn's disease   . Chronic diarrhea   . Joint pain   . GERD (gastroesophageal reflux disease)   . Polyarthralgia   . Anemia   . Hiatal hernia   . Hemorrhoids   . Anemia   . Hernia (acquired) (recurrent)     hiatal  . Pneumonia 2014  . Exertional shortness of breath     "q once in awhile" (07/13/2013)  . Arthritis     "all over" (07/13/2013)  . Osteoporosis     Past Surgical History  Procedure Laterality Date  . Bowel resection    . Colostomy      [name]  . Anal stenosis    . Colonoscopy  2000    needs to have one  . Cholecystectomy    . Appendectomy    . Goiter  1999  . Esophagogastroduodenoscopy  11/11/2011    Procedure: ESOPHAGOGASTRODUODENOSCOPY (EGD);  Surgeon: Barrie Folk, MD;  Location: [facility] ENDOSCOPY;  Service: Endoscopy;  Laterality: N/A;  . Flexible sigmoidoscopy  11/11/2011    Procedure: FLEXIBLE SIGMOIDOSCOPY;  Surgeon: Barrie Folk, MD;  Location: [facility] ENDOSCOPY;  Service: Endoscopy;  Laterality: N/A;  . Esophagogastroduodenoscopy  05/18/2012    Procedure: ESOPHAGOGASTRODUODENOSCOPY (EGD);  Surgeon: Malissa Hippo, MD;  Location: [facility] ENDO SUITE;  Service: Endoscopy;  Laterality: N/A;  200    Clarene Duke RD, LDN Pager [phone] After Hours pager [phone]

## 2013-07-13 NOTE — Progress Notes (Signed)
CRITICAL VALUE ALERT  Critical value received:  Potassium <2 and CO2 9  Date of notification:  07/13/2013  Time of notification:  0625  Critical value read back:yes  Nurse who received alert:  Bernie Covey RN  MD notified (1st page):  Craige Cotta  Time of first page:  [phone]  MD notified (2nd page):  Time of second page:  Responding MD:  Craige Cotta with [employer]  Time MD responded:  [phone]

## 2013-07-13 NOTE — Progress Notes (Signed)
CRITICAL VALUE ALERT  Critical value received:  Potassium 2.3  Date of notification:  07/13/13  Time of notification:  1615  Critical value read back:yes  Nurse who received alert:  Lavell Anchors, RN  MD notified (1st page): Dhungel  Time of first page:  1615  MD notified (2nd page):  Time of second page:  Responding MD:  Dhungel  Time MD responded:  [phone]

## 2013-07-13 NOTE — Progress Notes (Signed)
[facility] PROGRESS NOTE  Danielle [name] ZOX:[id number] DOB: 07-02-1946 DOA: 07/12/2013 PCP: [name], MD   Brief narrative 67 year old female with history of Crohn's disease, GERD and anemia who presented with three-day history of several episodes of nonbloody diarrhea associated with nausea. Patient noted to be in severe metabolic acidosis and severe hypokalemia.  Assessment/Plan: Severe metabolic acidosis Secondary to diarrhea. Patient has hypokalemia with a non-gap metabolic acidosis. bicarbonate of 9 this morning. She denies these symptoms to be similar to her Crohn's flare. Given poor IV access in lites could not be replenished probably overnight. Patient now has a PICC line placed. Ordered for D5 with 3 amp of bicarbonate at 125 cc per hour. Repeat imaging nor potassium with IV and oral supplements. (40 mEq po x 2  And 10 meq IV x 6 doses). Replenish low magnesium. Patient does complain of some cramps in her legs which is likely secondary to this. Off note she was admitted back in April with similar presentation. Symptoms are likely triggered by viral gastroenteritis. Patient denies having any episode of vomiting. -Stool for C. difficile negative. Stool culture pending. Will order when necessary  Lomotil. -CT scan of the abdomen done on admission , and some possible chronic perirectal abscess. However given the absence of fever and abdominal symptoms i doubt this is likely. She does have some leukocytosis which is possibly reactive. Will hold off further w/up.  Acute kidney injury Secondary to dehydration. Improving with IV fluids. Monitor in a.m.  Crohn's disease Followed in Dr. Karilyn Cota. She was on limited previously but stopped due to  development of pneumonia. Continue dicyclomine  GERD Continue PPI   Chronic back pain Continue Vicodin  Code Status: Full code Family Communication: None  Disposition Plan: Home once  stable   Consultants: None Procedures:  None  Antibiotics: IV ciprofloxacin and Flagyl HPI/Subjective: Patient reports one episode of diarrhea this morning. No nausea or vomiting.  Objective: Filed Vitals:   07/13/13 0936  BP: 101/65  Pulse: 76  Temp: 98 F (36.7 C)  Resp: 20    Intake/Output Summary (Last 24 hours) at 07/13/13 1454 Last data filed at 07/13/13 8119  Gross per 24 hour  Intake 2447.08 ml  Output    900 ml  Net 1547.08 ml   Filed Weights   07/12/13 2008 07/12/13 2041  Weight: 59.421 kg (131 lb) 59.421 kg (131 lb)    Exam:   General: elderly female in NAD  HEENT: no pallor, moist oral mucosa  Cardiovascular: N S1&S2, no murmurs, rubs or gallop  Respiratory: Clear to auscultation bilaterally, no added sounds  Abdomen: Soft, nondistended, bowel sounds present, mild tenderness to deep palpation over left lower quadrant  Musculoskeletal: Warm, no edema  CNS: AAO x3  Data Reviewed: Basic Metabolic Panel:  Recent Labs Lab 07/12/13 1202 07/12/13 1541 07/13/13 0502  NA 134*  --  136  K <2.0* <2.0* <2.0*  CL 103  --  112  CO2 11*  --  9*  GLUCOSE 95  --  87  BUN 26*  --  19  CREATININE 1.92*  --  1.49*  CALCIUM 8.0*  --  7.5*  MG  --  1.3*  --    Liver Function Tests:  Recent Labs Lab 07/12/13 1202  AST 28  ALT 26  ALKPHOS 87  BILITOT 0.5  PROT 7.8  ALBUMIN 3.3*    Recent Labs Lab 07/12/13 1202 07/13/13 0502  LIPASE 61* 42   No results found for this  basename: AMMONIA,  in the last 168 hours CBC:  Recent Labs Lab 07/12/13 1202 07/13/13 0502  WBC 10.3 16.0*  NEUTROABS 8.4* 11.9*  HGB 13.2 12.2  HCT 36.6 33.2*  MCV 78.0 77.6*  PLT 381 364   Cardiac Enzymes:  Recent Labs Lab 07/12/13 1202  TROPONINI <0.30   BNP (last 3 results)  Recent Labs  02/24/13 1625 04/28/13 2305  PROBNP 511.1* 215.7*   CBG: No results found for this basename: GLUCAP,  in the last 168 hours  Recent Results (from the past  240 hour(s))  MRSA PCR SCREENING     Status: None   Collection Time    07/13/13  1:26 AM      Result Value Range Status   MRSA by PCR NEGATIVE  NEGATIVE Final   Comment:            The GeneXpert MRSA Assay (FDA     approved for NASAL specimens     only), is one component of a     comprehensive MRSA colonization     surveillance program. It is not     intended to diagnose MRSA     infection nor to guide or     monitor treatment for     MRSA infections.  CLOSTRIDIUM DIFFICILE BY PCR     Status: None   Collection Time    07/13/13  7:06 AM      Result Value Range Status   C difficile by pcr NEGATIVE  NEGATIVE Final     Studies: Ct Abdomen Pelvis Wo Contrast  07/12/2013   *RADIOLOGY REPORT*  Clinical Data: Right-sided abdominal pain with nausea and diarrhea. Prior cholecystectomy and appendectomy.  History of Crohn's disease.  CT ABDOMEN AND PELVIS WITHOUT CONTRAST  Technique:  Multidetector CT imaging of the abdomen and pelvis was performed following the standard protocol without intravenous contrast.  Comparison: 12/26/2012  Findings: A very large hiatal hernia is noted and contains proximally to thirds of the stomach and a segment of the transverse colon.  Although incompletely visualized, there is no evidence for edema or inflammation within the hernia sac.  Liver and spleen have normal uninfused features.  The duodenum, pancreas, and adrenal glands are unremarkable.  Gallbladder is surgically absent.  No stones are seen in either kidney.  No secondary changes in either kidney or ureter.  No abdominal aortic aneurysm.  No lymphadenopathy in the abdomen. No evidence for intraperitoneal free fluid.  Imaging through the pelvis shows no intraperitoneal free fluid. There is no pelvic sidewall lymphadenopathy.  There is an abnormal tract of soft tissue beginning in the region of the anal verge and tracking posterolaterally to the right into the ischiorectal fat.  Imaging features could be related to  a blind ending fistula or linear abscess.  Abnormal soft tissue to the left of the anus may be related to a fistula or scar related to an old perianal fistula.  Bone windows reveal no worrisome lytic or sclerotic osseous lesions.  IMPRESSION: Linear bands of soft tissue attenuation tracking to the right of the rectum and left of the anus.  On the right, this could be a a linear chronic perirectal linear abscess.  On the left, a peri-anal fistula or scarring from previous fistula could have this appearance.   Original Report Authenticated By: Kennith Center, M.D.   Dg Chest 2 View  07/12/2013   *RADIOLOGY REPORT*  Clinical Data: Shortness of breath, right-sided abdominal pain and diarrhea  CHEST - 2  VIEW  Comparison: Chest x-ray of 04/28/2013  Findings: The lungs remain clear.  The heart is within normal limits in size.  A moderate sized hiatal hernia again is noted.  An old right posterolateral seventh rib fracture is noted.  IMPRESSION: No active lung disease.  Moderate sized hiatal hernia remains.   Original Report Authenticated By: Dwyane Dee, M.D.    Scheduled Meds: . ciprofloxacin  400 mg Intravenous Q12H  . feeding supplement  1 Container Oral BID BM  . metronidazole  500 mg Intravenous Q8H  . potassium chloride  10 mEq Intravenous Q1 Hr x 6  . potassium chloride  40 mEq Oral Q4H  . sodium chloride  3 mL Intravenous Q12H   Continuous Infusions: . sterile water with sodium bicarbonate 150 mEq, potassium chloride ( ) 125 mL/hr at 07/13/13 0916       Time spent: 25 minutes    Keith Felten  [facility] Pager [phone] If 7PM-7AM, please contact night-coverage at [email or web address], password [facility] 07/13/2013, 2:54 PM  LOS: 1 day

## 2013-07-13 NOTE — Progress Notes (Signed)
Peripherally Inserted Central Catheter/Midline Placement  The IV Nurse has discussed with the patient and/or persons authorized to consent for the patient, the purpose of this procedure and the potential benefits and risks involved with this procedure.  The benefits include less needle sticks, lab draws from the catheter and patient may be discharged home with the catheter.  Risks include, but not limited to, infection, bleeding, blood clot (thrombus formation), and puncture of an artery; nerve damage and irregular heat beat.  Alternatives to this procedure were also discussed.  PICC/Midline Placement Documentation        Mellissa Kohut 07/13/2013, 9:03 AM

## 2013-07-13 NOTE — Progress Notes (Signed)
Received critical lab for potassium less than 2.0 and CO 2.  [facility] notified.  Patient lost right AC.  IV Therapy x 2 unable to restart PIV.  Patient has needed PICC in the past.  Rec'd order from [facility] to do PICC.  Per IV Therapy, they will attempt PICC at about 0800.  Patient is stable.  No respiratory distress.  Will continue to monitor patient.  Devlyn Retter RN, Justine Null

## 2013-07-14 DIAGNOSIS — N390 Urinary tract infection, site not specified: Secondary | ICD-10-CM

## 2013-07-14 LAB — MAGNESIUM: Magnesium: 1.9 mg/dL (ref 1.5–2.5)

## 2013-07-14 LAB — URINE CULTURE: Colony Count: >=100000

## 2013-07-14 LAB — OVA AND PARASITE EXAMINATION: Ova and parasites: NONE SEEN

## 2013-07-14 LAB — BASIC METABOLIC PANEL
GFR calc Af Amer: 52 mL/min — ABNORMAL LOW (ref >90)
GFR calc non Af Amer: 45 mL/min — ABNORMAL LOW (ref >90)
Glucose, Bld: 88 mg/dL (ref 70–99)
Potassium: 2.7 mEq/L — CL (ref 3.5–5.1)
Sodium: 138 mEq/L (ref 135–145)

## 2013-07-14 LAB — CBC
Hemoglobin: 10.6 g/dL — ABNORMAL LOW (ref 12.0–15.0)
Platelets: 282 10*3/uL (ref 150–400)
RBC: 3.8 MIL/uL — ABNORMAL LOW (ref 3.87–5.11)
WBC: 10.3 10*3/uL (ref 4.0–10.5)

## 2013-07-14 LAB — POTASSIUM: Potassium: 3 mEq/L — ABNORMAL LOW (ref 3.5–5.1)

## 2013-07-14 MED ORDER — POTASSIUM CHLORIDE CRYS ER 20 MEQ PO TBCR: 40.0000 meq | EXTENDED_RELEASE_TABLET | ORAL | Status: AC

## 2013-07-14 MED ORDER — POTASSIUM CHLORIDE 10 MEQ/100ML IV SOLN
10.0000 meq | INTRAVENOUS | Status: AC
  Administered 2013-07-14 (×5): 10 meq via INTRAVENOUS
  Filled 2013-07-14 (×5): qty 100

## 2013-07-14 MED ORDER — DEXTROSE 5 % IV SOLN
1.0000 g | INTRAVENOUS | Status: DC
  Administered 2013-07-14 – 2013-07-15 (×2): 1 g via INTRAVENOUS
  Filled 2013-07-14 (×3): qty 10

## 2013-07-14 NOTE — Progress Notes (Addendum)
[facility] PROGRESS NOTE  Danielle [name] ZOX:[id number] DOB: Mar 04, 1946 DOA: 07/12/2013 PCP: [name], MD Primary GI: Dr. Minus Liberty   Brief narrative 67 year old female with history of Crohn's disease, GERD and anemia who presented with three-day history of several episodes of nonbloody diarrhea associated with nausea. Patient noted to be in severe metabolic acidosis and severe hypokalemia.  Assessment/Plan:  Severe metabolic acidosis - Secondary to diarrhea. Patient had hypokalemia with a non-gap metabolic acidosis. She denies these symptoms to be similar to her Crohn's flare. - Given poor IV access - PICC line placed.  - Patient was started on bicarbonate drip. Bicarbonate has improved from 11 on admission to 19 on 8/8. Potassium 2.7-being repleted IV & will followup BMP this afternoon. - Off note she was admitted back in April with similar presentation. Symptoms are likely triggered by viral gastroenteritis. Patient denies having any episode of vomiting. No history of sickly contacts or recent antibiotic use. - Stool for C. difficile negative. Stool culture pending. Will order when necessary  Lomotil. Diarrhea improving. - CT scan of the abdomen done on admission , and some possible chronic perirectal abscess. However given the absence of fever and abdominal symptoms doubt this is likely. She does have some leukocytosis which is possibly reactive. Will hold off further w/up.  Hypokalemia/hypomagnesemia - Replete and follow BMP closely.  Acute kidney injury - Secondary to dehydration. Improving with IV fluids. Follow daily BMP.  Klebsiella pneumoniae UTI - Patient complains of mild intermittent dysuria - UA positive - Start IV Rocephin pending final sensitivity results.  Hypotension - Patient asymptomatic. - Continue IV fluids and monitor closely. - Patient does not seem to be on chronic steroids at home. - Check random cortisol.  Crohn's disease - Followed by Dr.  Karilyn Cota.  - Continue dicyclomine - C. difficile PCR negative. - Stool culture pending the  Microcytic anemia - Follow CBC in a.m. No overt bleeding.  GERD - Continue PPI  Chronic back pain - Continue Vicodin  Code Status: Full code Family Communication: None  Disposition Plan: Home once stable. Will get PT evaluation.   Consultants:  None  Procedures:  PICC  Antibiotics: IV Rocephin 8/8 >  HPI/Subjective: Patient states that she feels much better than on admission. Indicates that normally she has 2-3 BMs per day which are soft. Had 3 BMs in the last 24 hours which were soft. Complains of aches and pains of her limbs and has OP appointment to see rheumatology. Denies dizziness, lightheadedness, chest pain, dyspnea or feelings of passing out.  Objective: Filed Vitals:   07/14/13 1138  BP: 82/48  Pulse: 107  Temp: 98.2 F (36.8 C)  Resp:     Intake/Output Summary (Last 24 hours) at 07/14/13 1252 Last data filed at 07/13/13 2341  Gross per 24 hour  Intake    500 ml  Output      0 ml  Net    500 ml   Filed Weights   07/12/13 2008 07/12/13 2041 07/13/13 2040  Weight: 59.421 kg (131 lb) 59.421 kg (131 lb) 61.236 kg (135 lb)    Exam:   General: elderly female in NAD  HEENT: no pallor, moist oral mucosa  Cardiovascular: S1&S2, RRR, no murmurs, rubs or gallop. Telemetry: Sinus tachycardia in the 100s.  Respiratory: Clear to auscultation bilaterally, no added sounds. No increased work of breathing.  Abdomen: Soft, nondistended, bowel sounds present, and nontender today.  Musculoskeletal: Warm, no edema  CNS: AAO x3. No focal neurological deficits.  Data Reviewed: Basic Metabolic Panel:  Recent Labs Lab 07/12/13 1202 07/12/13 1541 07/13/13 0502 07/13/13 1615 07/14/13 0556  NA 134*  --  136 134* 138  K <2.0* <2.0* <2.0* 2.3* 2.7*  CL 103  --  112 108 107  CO2 11*  --  9* 12* 19  GLUCOSE 95  --  87 157* 88  BUN 26*  --  19 14 10   CREATININE  1.92*  --  1.49* 1.30* 1.24*  CALCIUM 8.0*  --  7.5* 7.0* 7.4*  MG  --  1.3*  --   --   --    Liver Function Tests:  Recent Labs Lab 07/12/13 1202  AST 28  ALT 26  ALKPHOS 87  BILITOT 0.5  PROT 7.8  ALBUMIN 3.3*    Recent Labs Lab 07/12/13 1202 07/13/13 0502  LIPASE 61* 42   No results found for this basename: AMMONIA,  in the last 168 hours CBC:  Recent Labs Lab 07/12/13 1202 07/13/13 0502 07/14/13 0556  WBC 10.3 16.0* 10.3  NEUTROABS 8.4* 11.9*  --   HGB 13.2 12.2 10.6*  HCT 36.6 33.2* 29.1*  MCV 78.0 77.6* 76.6*  PLT 381 364 282   Cardiac Enzymes:  Recent Labs Lab 07/12/13 1202  TROPONINI <0.30   BNP (last 3 results)  Recent Labs  02/24/13 1625 04/28/13 2305  PROBNP 511.1* 215.7*   CBG: No results found for this basename: GLUCAP,  in the last 168 hours  Recent Results (from the past 240 hour(s))  URINE CULTURE     Status: None   Collection Time    07/12/13  2:57 PM      Result Value Range Status   Specimen Description URINE, RANDOM   Final   Special Requests NONE   Final   Culture  Setup Time     Final   Value: 07/12/2013 16:12     Performed at [facility] Count     Final   Value: >=100,000 COLONIES/ML     Performed at [facility]   Culture     Final   Value: KLEBSIELLA PNEUMONIAE     Performed at [facility]   Report Status 07/14/2013 FINAL   Final   Organism ID, Bacteria KLEBSIELLA PNEUMONIAE   Final  MRSA PCR SCREENING     Status: None   Collection Time    07/13/13  1:26 AM      Result Value Range Status   MRSA by PCR NEGATIVE  NEGATIVE Final   Comment:            The GeneXpert MRSA Assay (FDA     approved for NASAL specimens     only), is one component of a     comprehensive MRSA colonization     surveillance program. It is not     intended to diagnose MRSA     infection nor to guide or     monitor treatment for     MRSA infections.  CLOSTRIDIUM DIFFICILE BY PCR     Status: None    Collection Time    07/13/13  7:06 AM      Result Value Range Status   C difficile by pcr NEGATIVE  NEGATIVE Final  STOOL CULTURE     Status: None   Collection Time    07/13/13  7:07 AM      Result Value Range Status   Specimen Description STOOL   Final   Special  Requests NONE   Final   Culture     Final   Value: Culture reincubated for better growth     Performed at [facility]   Report Status PENDING   Incomplete     Studies: Ct Abdomen Pelvis Wo Contrast  07/12/2013   *RADIOLOGY REPORT*  Clinical Data: Right-sided abdominal pain with nausea and diarrhea. Prior cholecystectomy and appendectomy.  History of Crohn's disease.  CT ABDOMEN AND PELVIS WITHOUT CONTRAST  Technique:  Multidetector CT imaging of the abdomen and pelvis was performed following the standard protocol without intravenous contrast.  Comparison: 12/26/2012  Findings: A very large hiatal hernia is noted and contains proximally to thirds of the stomach and a segment of the transverse colon.  Although incompletely visualized, there is no evidence for edema or inflammation within the hernia sac.  Liver and spleen have normal uninfused features.  The duodenum, pancreas, and adrenal glands are unremarkable.  Gallbladder is surgically absent.  No stones are seen in either kidney.  No secondary changes in either kidney or ureter.  No abdominal aortic aneurysm.  No lymphadenopathy in the abdomen. No evidence for intraperitoneal free fluid.  Imaging through the pelvis shows no intraperitoneal free fluid. There is no pelvic sidewall lymphadenopathy.  There is an abnormal tract of soft tissue beginning in the region of the anal verge and tracking posterolaterally to the right into the ischiorectal fat.  Imaging features could be related to a blind ending fistula or linear abscess.  Abnormal soft tissue to the left of the anus may be related to a fistula or scar related to an old perianal fistula.  Bone windows reveal no worrisome  lytic or sclerotic osseous lesions.  IMPRESSION: Linear bands of soft tissue attenuation tracking to the right of the rectum and left of the anus.  On the right, this could be a a linear chronic perirectal linear abscess.  On the left, a peri-anal fistula or scarring from previous fistula could have this appearance.   Original Report Authenticated By: Kennith Center, M.D.    Scheduled Meds: . calcium-vitamin D  1 tablet Oral Daily  . dicyclomine  20 mg Oral TID AC & HS  . feeding supplement  1 Container Oral BID BM  . multivitamin with minerals  1 tablet Oral Daily  . sodium chloride  3 mL Intravenous Q12H  . spironolactone  50 mg Oral Daily   Continuous Infusions: . sterile water with sodium bicarbonate 150 mEq, potassium chloride ( ) 125 mL/hr at 07/14/13 0357       Time spent: 25 minutes    [facility]  [facility] Pager [phone] If 7PM-7AM, please contact night-coverage at [email or web address], password [facility] 07/14/2013, 12:52 PM  LOS: 2 days

## 2013-07-14 NOTE — Progress Notes (Signed)
CRITICAL VALUE ALERT  Critical value received: Potassium 2.7 Date of notification: 07/14/13 Time of notification: 0655 Critical value read back: yes Nurse who received alert: Waymon Budge MD notified (1st page): Donnamarie Poag NP Time of first page: [phone] MD notified (2nd page):  Time of second page:  Responding MD:  Time MD responded:

## 2013-07-15 DIAGNOSIS — D649 Anemia, unspecified: Secondary | ICD-10-CM

## 2013-07-15 LAB — ALBUMIN: Albumin: 2.2 g/dL — ABNORMAL LOW (ref 3.5–5.2)

## 2013-07-15 LAB — BASIC METABOLIC PANEL
CO2: 31 mEq/L (ref 19–32)
Calcium: 6.9 mg/dL — ABNORMAL LOW (ref 8.4–10.5)
Calcium: 7.1 mg/dL — ABNORMAL LOW (ref 8.4–10.5)
GFR calc Af Amer: 57 mL/min — ABNORMAL LOW (ref >90)
GFR calc non Af Amer: 49 mL/min — ABNORMAL LOW (ref >90)
GFR calc non Af Amer: 46 mL/min — ABNORMAL LOW (ref >90)
Glucose, Bld: 92 mg/dL (ref 70–99)
Potassium: 2.8 mEq/L — ABNORMAL LOW (ref 3.5–5.1)
Potassium: 4 mEq/L (ref 3.5–5.1)
Sodium: 138 mEq/L (ref 135–145)
Sodium: 136 mEq/L (ref 135–145)

## 2013-07-15 LAB — CORTISOL: Cortisol, Plasma: 14.7 ug/dL

## 2013-07-15 MED ORDER — POTASSIUM CHLORIDE CRYS ER 20 MEQ PO TBCR
40.0000 meq | EXTENDED_RELEASE_TABLET | ORAL | Status: DC
  Administered 2013-07-15: 40 meq via ORAL
  Filled 2013-07-15: qty 2

## 2013-07-15 MED ORDER — POTASSIUM CHLORIDE 10 MEQ/100ML IV SOLN
10.0000 meq | INTRAVENOUS | Status: AC
  Administered 2013-07-15 (×6): 10 meq via INTRAVENOUS
  Filled 2013-07-15 (×6): qty 100

## 2013-07-15 NOTE — Progress Notes (Signed)
Called to room for patient c/o "chest pain." Patient c/o" cramping pain in upper right chest," nonradiating. VS: 91/50, 100. 16, POx= 100%. Potassium low this am. PO KCL ordered and given but patient vomited approximately 10 minutes after giving. Pain is increased with deep breath. Dr.Hongalgi notified. New orders received.

## 2013-07-15 NOTE — Progress Notes (Signed)
[facility] PROGRESS NOTE  Danielle [name] GGY:[id number] DOB: 1946/01/21 DOA: 07/12/2013 PCP: [name], MD Primary GI: Dr. Minus Liberty   Brief narrative 67 year old female with history of Crohn's disease, GERD and anemia who presented with three-day history of several episodes of nonbloody diarrhea associated with nausea. Patient noted to be in severe metabolic acidosis and severe hypokalemia.  Assessment/Plan:  Severe metabolic acidosis - Secondary to diarrhea. Patient had hypokalemia with a non-gap metabolic acidosis. She denies these symptoms to be similar to her Crohn's flare. - Given poor IV access - PICC line placed.  - Patient was started on bicarbonate drip. Bicarbonate improved from 11 on admission to 31 on 8/9. DC'd bicarbonate drip. - Off note she was admitted back in April with similar presentation. Symptoms are likely triggered by viral gastroenteritis. Patient denies having any episode of vomiting. No history of sickly contacts or recent antibiotic use. - Stool for C. difficile negative. Stool culture pending. Will order when necessary  Lomotil. Diarrhea improving. - CT scan of the abdomen done on admission , and some possible chronic perirectal abscess. However given the absence of fever and abdominal symptoms doubt this is likely. She does have some leukocytosis which is possibly reactive. Will hold off further w/up. - resolved  Hypokalemia/hypomagnesemia - Repeating daily and monitoring. - Patient was written for by mouth potassium supplements last night but did not tolerate it then or this morning. - IV KCl runs and follow BMP this afternoon and replete as needed. - Magnesium: 1.9 - Continue Aldactone which might also help.  Acute kidney injury - Secondary to dehydration.  - Seems to have resolved.  Klebsiella pneumoniae UTI - Patient complains of mild intermittent dysuria - UA positive - We'll change to oral antibiotics in a day or 2.  Hypotension -  Patient asymptomatic. - She may have chronic soft blood pressures. - Patient does not seem to be on chronic steroids at home. - Check random cortisol: 14.7. - DC IV fluids and monitor.  Crohn's disease/diarrhea - Followed by Dr. Karilyn Cota.  - Continue dicyclomine - C. difficile PCR negative. - Stool culture pending - Acute diarrhea may be secondary to viral gastroenteritis. Diarrhea has resolved.  Microcytic anemia - Mild drop in hemoglobin. No overt bleeding.? Dilutional. - Followup CBC in a.m.  GERD - Continue PPI  Chronic back pain - Continue Vicodin  Right sided chest pain/musculoskeletal - When necessary Tylenol.  Code Status: Full code Family Communication: None  Disposition Plan: Home once stable. Will get PT evaluation.   Consultants:  None  Procedures:  PICC  Antibiotics: IV Rocephin 8/8 >  HPI/Subjective: Her stools have returned to baseline. 2 loose BMs in the last 24 hours. Unable to keep by mouth potassium down-vomits. This morning complained of right lower anterior chest pain, worse on deep breathing and palpation. No dyspnea. No cough.  Objective: Filed Vitals:   07/15/13 1043  BP: 79/45  Pulse: 101  Temp: 98.2 F (36.8 C)  Resp: 18    Intake/Output Summary (Last 24 hours) at 07/15/13 1222 Last data filed at 07/15/13 0900  Gross per 24 hour  Intake    240 ml  Output    351 ml  Net   -111 ml   Filed Weights   07/12/13 2041 07/13/13 2040 07/14/13 2206  Weight: 59.421 kg (131 lb) 61.236 kg (135 lb) 62.5 kg (137 lb 12.6 oz)    Exam:   General: elderly female in NAD  HEENT: no pallor, moist oral mucosa  Cardiovascular: S1&S2, RRR, no murmurs, rubs or gallop. Reproducible lower right anterior rib cage tenderness-mild.  Respiratory: Clear to auscultation bilaterally, no added sounds. No increased work of breathing.  Abdomen: Soft, nondistended, bowel sounds present, and nontender today.  Musculoskeletal: Warm, no edema  CNS: AAO  x3. No focal neurological deficits.  Data Reviewed: Basic Metabolic Panel:  Recent Labs Lab 07/12/13 1202 07/12/13 1541 07/13/13 0502 07/13/13 1615 07/14/13 0556 07/14/13 1400 07/15/13 0500  NA 134*  --  136 134* 138  --  138  K <2.0* <2.0* <2.0* 2.3* 2.7* 3.0* 2.8*  CL 103  --  112 108 107  --  99  CO2 11*  --  9* 12* 19  --  31  GLUCOSE 95  --  87 157* 88  --  92  BUN 26*  --  19 14 10   --  5*  CREATININE 1.92*  --  1.49* 1.30* 1.24*  --  1.15*  CALCIUM 8.0*  --  7.5* 7.0* 7.4*  --  6.9*  MG  --  1.3*  --   --   --  1.9  --    Liver Function Tests:  Recent Labs Lab 07/12/13 1202  AST 28  ALT 26  ALKPHOS 87  BILITOT 0.5  PROT 7.8  ALBUMIN 3.3*    Recent Labs Lab 07/12/13 1202 07/13/13 0502  LIPASE 61* 42   No results found for this basename: AMMONIA,  in the last 168 hours CBC:  Recent Labs Lab 07/12/13 1202 07/13/13 0502 07/14/13 0556 07/15/13 0500  WBC 10.3 16.0* 10.3 12.1*  NEUTROABS 8.4* 11.9*  --   --   HGB 13.2 12.2 10.6* 9.3*  HCT 36.6 33.2* 29.1* 26.6*  MCV 78.0 77.6* 76.6* 79.6  PLT 381 364 282 216   Cardiac Enzymes:  Recent Labs Lab 07/12/13 1202  TROPONINI <0.30   BNP (last 3 results)  Recent Labs  02/24/13 1625 04/28/13 2305  PROBNP 511.1* 215.7*   CBG: No results found for this basename: GLUCAP,  in the last 168 hours  Recent Results (from the past 240 hour(s))  URINE CULTURE     Status: None   Collection Time    07/12/13  2:57 PM      Result Value Range Status   Specimen Description URINE, RANDOM   Final   Special Requests NONE   Final   Culture  Setup Time     Final   Value: 07/12/2013 16:12     Performed at [facility] Count     Final   Value: >=100,000 COLONIES/ML     Performed at [facility]   Culture     Final   Value: KLEBSIELLA PNEUMONIAE     Performed at [facility]   Report Status 07/14/2013 FINAL   Final   Organism ID, Bacteria KLEBSIELLA PNEUMONIAE   Final  MRSA  PCR SCREENING     Status: None   Collection Time    07/13/13  1:26 AM      Result Value Range Status   MRSA by PCR NEGATIVE  NEGATIVE Final   Comment:            The GeneXpert MRSA Assay (FDA     approved for NASAL specimens     only), is one component of a     comprehensive MRSA colonization     surveillance program. It is not     intended to diagnose MRSA  infection nor to guide or     monitor treatment for     MRSA infections.  CLOSTRIDIUM DIFFICILE BY PCR     Status: None   Collection Time    07/13/13  7:06 AM      Result Value Range Status   C difficile by pcr NEGATIVE  NEGATIVE Final  STOOL CULTURE     Status: None   Collection Time    07/13/13  7:07 AM      Result Value Range Status   Specimen Description STOOL   Final   Special Requests NONE   Final   Culture     Final   Value: Culture reincubated for better growth     Performed at [facility]   Report Status PENDING   Incomplete  OVA AND PARASITE EXAMINATION     Status: None   Collection Time    07/13/13  7:07 AM      Result Value Range Status   Specimen Description STOOL   Final   Special Requests NONE   Final   Ova and parasites     Final   Value: NO OVA OR PARASITES SEEN     Performed at [facility]   Report Status 07/14/2013 FINAL   Final     Studies: No results found.  Scheduled Meds: . calcium-vitamin D  1 tablet Oral Daily  . cefTRIAXone (ROCEPHIN)  IV  1 g Intravenous Q24H  . dicyclomine  20 mg Oral TID AC & HS  . feeding supplement  1 Container Oral BID BM  . multivitamin with minerals  1 tablet Oral Daily  . potassium chloride  10 mEq Intravenous Q1 Hr x 6  . sodium chloride  3 mL Intravenous Q12H  . spironolactone  50 mg Oral Daily   Continuous Infusions:       Time spent: 25 minutes    [facility]  [facility] Pager [phone] If 7PM-7AM, please contact night-coverage at [email or web address], password [facility] 07/15/2013, 12:22 PM  LOS: 3 days

## 2013-07-15 NOTE — Progress Notes (Signed)
Called to patient's room at approx,. 1433 with patient c/o " it feels like I might be having a stroke." Patient sitting up in chair,no slurred speech, no facial asymmetry,no weakness on either side,slight muscle twitching in hands (on 3rd run of potassium). VS: afebrile,-106-16-93/63-99%RA. Dr. Waymon Amato notified.No new orders.

## 2013-07-15 NOTE — Evaluation (Signed)
Physical Therapy Evaluation Patient Details Name: SMT LOKEY MRN: [id number] DOB: 1946-08-09 Today's Date: 07/15/2013 Time: 4098-1191 PT Time Calculation (min): 20 min  PT Assessment / Plan / Recommendation History of Present Illness  67 year old female with history of Crohn's disease, GERD and anemia who presented with three-day history of several episodes of nonbloody diarrhea associated with nausea. Patient noted to be in severe metabolic acidosis and severe hypokalemia.  Clinical Impression  Pt very pleasant and willing to work with therapy.  Pt reports typically ambulating with SPC.  Stimulate cane with IV pole however plan to practice with SPC next session.  Pt will benefit from acute PT services to improve overall mobility to prepare for safe d/c home with HHPT.     PT Assessment  Patient needs continued PT services    Follow Up Recommendations  Home health PT;Supervision - Intermittent    Equipment Recommendations  None recommended by PT    Frequency Min 3X/week    Precautions / Restrictions Precautions Precautions: None Restrictions Weight Bearing Restrictions: No   Pertinent Vitals/Pain No c/o pain      Mobility  Bed Mobility Bed Mobility: Not assessed Transfers Transfers: Sit to Stand;Stand to Sit Sit to Stand: 4: Min guard;From chair/3-in-1 Stand to Sit: 4: Min guard;To chair/3-in-1 Details for Transfer Assistance: Minguard for safety with cues for hand placement Ambulation/Gait Ambulation/Gait Assistance: 4: Min guard Ambulation Distance (Feet): 100 Feet Assistive device: Other (Comment) (IV pole to maintain balance) Ambulation/Gait Assistance Details: Minguard for safety with cues for RW placement and upright posture Gait Pattern: Step-through pattern;Decreased stride length Gait velocity: decreased Stairs: No    Exercises     PT Diagnosis: Difficulty walking;Generalized weakness  PT Problem List: Decreased strength;Decreased activity  tolerance;Decreased mobility;Decreased balance;Decreased knowledge of use of DME PT Treatment Interventions: DME instruction;Gait training;Functional mobility training;Therapeutic activities;Therapeutic exercise;Balance training;Patient/family education     PT Goals(Current goals can be found in the care plan section) Acute Rehab PT Goals Patient Stated Goal: To go home PT Goal Formulation: With patient Time For Goal Achievement: 07/22/13 Potential to Achieve Goals: Good  Visit Information  Last PT Received On: 07/15/13 History of Present Illness: 67 year old female with history of Crohn's disease, GERD and anemia who presented with three-day history of several episodes of nonbloody diarrhea associated with nausea. Patient noted to be in severe metabolic acidosis and severe hypokalemia.       Prior Functioning  Home Living Family/patient expects to be discharged to:: Private residence Living Arrangements: Other relatives (sister) Available Help at Discharge: Family;Available 24 hours/day Type of Home: House Home Access: Level entry Home Layout: One level Home Equipment: Cane - single point;Hand held shower head;Shower seat Prior Function Level of Independence: Independent Communication Communication: No difficulties Dominant Hand: Left    Cognition  Cognition Arousal/Alertness: Awake/alert Behavior During Therapy: WFL for tasks assessed/performed Overall Cognitive Status: Within Functional Limits for tasks assessed    Extremity/Trunk Assessment Lower Extremity Assessment Lower Extremity Assessment: Generalized weakness   Balance    End of Session PT - End of Session Equipment Utilized During Treatment: Gait belt Activity Tolerance: Patient tolerated treatment well Patient left: Other (comment);with call bell/phone within reach (sitting on 3n1 with call bell, RN aware) Nurse Communication: Mobility status  GP     Lavell Supple 07/15/2013, 3:44 PM   Jake Shark, PT  DPT [phone]

## 2013-07-16 DIAGNOSIS — E860 Dehydration: Secondary | ICD-10-CM

## 2013-07-16 DIAGNOSIS — K5289 Other specified noninfective gastroenteritis and colitis: Secondary | ICD-10-CM

## 2013-07-16 LAB — STOOL CULTURE

## 2013-07-16 LAB — BASIC METABOLIC PANEL
CO2: 31 mEq/L (ref 19–32)
Chloride: 101 mEq/L (ref 96–112)
Creatinine, Ser: 1.3 mg/dL — ABNORMAL HIGH (ref 0.50–1.10)

## 2013-07-16 LAB — CBC
HCT: 25.2 % — ABNORMAL LOW (ref 36.0–46.0)
Hemoglobin: 9.3 g/dL — ABNORMAL LOW (ref 12.0–15.0)
Hemoglobin: 8.8 g/dL — ABNORMAL LOW (ref 12.0–15.0)
MCV: 81.3 fL (ref 78.0–100.0)
Platelets: 216 10*3/uL (ref 150–400)
RBC: 3.34 MIL/uL — ABNORMAL LOW (ref 3.87–5.11)
RBC: 3.1 MIL/uL — ABNORMAL LOW (ref 3.87–5.11)
WBC: 10.2 10*3/uL (ref 4.0–10.5)

## 2013-07-16 MED ORDER — DIPHENOXYLATE-ATROPINE 2.5-0.025 MG PO TABS: 1.0000 | ORAL_TABLET | Freq: Two times a day (BID) | ORAL | Status: DC | PRN

## 2013-07-16 MED ORDER — POTASSIUM CHLORIDE 20 MEQ/15ML (10%) PO LIQD
40.0000 meq | Freq: Once | ORAL | Status: AC
  Administered 2013-07-16: 40 meq via ORAL
  Filled 2013-07-16: qty 30

## 2013-07-16 MED ORDER — POTASSIUM CHLORIDE 20 MEQ PO PACK
40.0000 meq | PACK | Freq: Once | ORAL | Status: DC
  Filled 2013-07-16: qty 2

## 2013-07-16 MED ORDER — BOOST / RESOURCE BREEZE PO LIQD: 1.0000 | Freq: Two times a day (BID) | ORAL | Status: DC

## 2013-07-16 MED ORDER — POTASSIUM CHLORIDE CRYS ER 20 MEQ PO TBCR: 40.0000 meq | EXTENDED_RELEASE_TABLET | Freq: Once | ORAL | Status: DC

## 2013-07-16 MED ORDER — CEFUROXIME AXETIL 500 MG PO TABS: 500.0000 mg | ORAL_TABLET | Freq: Two times a day (BID) | ORAL | Status: DC

## 2013-07-16 MED ORDER — SPIRONOLACTONE 50 MG PO TABS: 50.0000 mg | ORAL_TABLET | Freq: Every day | ORAL | Status: DC

## 2013-07-16 NOTE — Progress Notes (Signed)
   CARE MANAGEMENT NOTE 07/16/2013  Patient:  PEGI, MILAZZO   Account Number:  [id number]  Date Initiated:  07/16/2013  Documentation initiated by:  [facility]  Subjective/Objective Assessment:   adm w/  vomiting, diarrhea, and muscle cramps     Action/Plan:   Anticipated DC Date:  07/16/2013   Anticipated DC Plan:  HOME W HOME HEALTH SERVICES      DC Planning Services  CM consult      [facility] Choice  HOME HEALTH   Choice offered to / List presented to:  C-1 Patient        HH arranged  HH-2 PT      [facility] agency  [facility]   Status of service:  Completed, signed off Medicare Important Message given?   (If response is "NO", the following Medicare IM given date fields will be blank) Date Medicare IM given:   Date Additional Medicare IM given:    Discharge Disposition:  HOME W HOME HEALTH SERVICES  Per UR Regulation:    If discussed at Long Length of Stay Meetings, dates discussed:    Comments:  07-16-13 15:30 Spoke with patient and offered choice; [facility] chosen by patient; faxed the facesheet and home health physical therapy orders to [facility] and called the referral to [facility].  No other CM needs communicated. Freddy Jaksch, BSN, CM

## 2013-07-16 NOTE — Discharge Summary (Signed)
Physician Discharge Summary  MARLEENA SHUBERT ZOX:[id number] DOB: 09-Nov-1946 DOA: 07/12/2013  PCP: [name], MD  Admit date: 07/12/2013 Discharge date: 07/16/2013  Time spent: Greater than 30 minutes  Recommendations for Outpatient Follow-up:  1. Dr. Lionel December, GI in 3 days with repeat labs (CBC &BMP). 2. Dr. Mirna Mires, PCP in 1 week. 3. Followup final stool culture results during outpatient followup with GI. 4. CT abdomen and pelvis performed in the hospital on 07/12/13 to be kindly reviewed by Dr. Karilyn Cota during OP follow up.  Discharge Diagnoses:  Principal Problem:   Metabolic acidosis Active Problems:   Hypokalemia   Crohn's disease   GERD (gastroesophageal reflux disease)   UTI (lower urinary tract infection)   Nausea vomiting and diarrhea   Acute renal failure   Dehydration   Hypomagnesemia   Discharge Condition: Improved & Stable  Diet recommendation: Heart Healthy diet.  Filed Weights   07/13/13 2040 07/14/13 2206 07/15/13 2147  Weight: 61.236 kg (135 lb) 62.5 kg (137 lb 12.6 oz) 67.223 kg (148 lb 3.2 oz)    History of present illness:  67 year old female patient with history of Crohn's disease on monthly Remicade, chronic diarrhea, GERD, anemia, was admitted to the hospital on 07/12/13 with complaints of nausea, vomiting, diarrhea and was unable to keep anything down. She normally has 2-3 loose BMs daily. There was no hematochezia or hematemesis. She stated that this episode was not typical for her usual Crohn's flare. She also had cramping in her hands and muscles. She denied sickly contacts. In the ED, potassium was 2, creatinine was elevated and CT abdomen and pelvis showed chronic findings without any acute process. She was admitted for further management.  Hospital Course:  1. Nausea, vomiting and diarrhea: Possibly from acute viral gastroenteritis and less likely from Crohn's flare. Stool C. difficile PCR negative. Stools were also negative for O&P. Final  stool culture results are pending. She was treated supportively and improved. She has no further nausea or vomiting. She is tolerating diet. Her BMs are almost back to baseline. CT scan of abdomen on admission suggested possible chronic perirectal abscess-however patient did not have fever, abdominal or rectal pain-no further workup or intervention was done for this. However this can be followed up with her primary gastroenterologist as outpatient. 2. Severe metabolic acidosis: Precipitated by diarrhea. Due to lack of peripheral IV access, PICC line was placed. She was treated with bicarbonate drip and her acidosis has resolved. 3. Severe hypokalemia: Patient is on potassium supplements and Aldactone at home. This was precipitated by diarrhea. She received multiple doses of IV potassium and her potassium has improved. This can be followed up with repeat BMP in a couple of days as outpatient. 4. Hypomagnesemia: Repleted. 5. Acute kidney injury: Precipitated by diarrhea and dehydration: Resolved after IV fluids.? Stage II-3 chronic kidney disease versus mildly elevated creatinines secondary to being on diuretics. OP follow up. 6. Dehydration: Secondary to gastroenteritis. Resolved after IV fluids 7. Klebsiella pneumoniae UTI: Patient did complain of mild intermittent dysuria. UA was positive. She completed 2 days of IV Rocephin. We'll switch to oral Ceftin on discharge to complete total 7 days of antibiotics. 8. Hypotension: Patient has chronic soft blood pressures in the hospital in the range of 90s systolic. She is asymptomatic of this without dizziness, lightheadedness or sensation of passing out. 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 She is clinically euvolemic. She's not on chronic steroids at home. Random cortisol was 14.7. 9. Crohn's disease: C. difficile PCR negative.  As indicated above, acute nausea, vomiting and diarrhea were probably due to viral gastroenteritis. Patient will discuss with her  gastroenterologist regarding postponing her dose of Remicade that is due next week. During that visit, her abdominal CT scan can be reviewed. 10. Microcytic anemia: Baseline hemoglobin is probably in the 10.5-11 range. There has been gradual drop to 8.8 g/dL? Secondary to acute illness and dilutional. No overt bleeding. Repeat CBC in 3 days during outpatient followup. 11. GERD/hiatal hernia: Continue PPIs. 12. Chronic back pain: Not an issue during the hospitalization. Continue home pain medications. 13. Right-sided musculoskeletal type chest pain: Patient complained of right-sided chest pain on 8/9 which was brought on by deep inspiration or palpation. This resolved spontaneously. 14.   Procedures:  PICC line   Consultations:  None  Discharge Exam:  Complaints: No nausea, vomiting, chest pain. BMs have almost returned to baseline. Denies abdominal pain. Tolerating diet.  Filed Vitals:   07/15/13 1746 07/15/13 2147 07/16/13 0638 07/16/13 1000  BP: 101/58 93/60 89/58  91/56  Pulse: 98 103 96 105  Temp: 97.4 F (36.3 C) 99.2 F (37.3 C) 99.1 F (37.3 C) 98.6 F (37 C)  TempSrc: Oral Axillary Oral Oral  Resp: 18 18 18 18   Height:  5' 4.5" (1.638 m)    Weight:  67.223 kg (148 lb 3.2 oz)    SpO2: 100% 97% 99% 98%    General: elderly female in NAD  Cardiovascular: S1&S2, RRR, no murmurs, rubs or gallop.  Respiratory: Clear to auscultation bilaterally, no added sounds. No increased work of breathing. Nontender chest wall palpation. Abdomen: Soft, nondistended, bowel sounds present, and nontender today.  CNS: AAO x3. No focal neurological deficits.   Discharge Instructions      Discharge Orders   Future Appointments Provider Department Dept Phone   07/18/2013 10:45 AM [facility] IV/Med Infusion Room [facility] [phone]   09/11/2013 3:45 PM Malissa Hippo, MD [facility] [phone]   Future Orders Complete By Expires     Call MD  for:  extreme fatigue  As directed     Call MD for:  persistant dizziness or light-headedness  As directed     Call MD for:  persistant nausea and vomiting  As directed     Call MD for:  severe uncontrolled pain  As directed     Call MD for:  temperature >100.4  As directed     Call MD for:  As directed     Comments:      Worsening diarrhea.    Diet - low sodium heart healthy  As directed     Increase activity slowly  As directed         Medication List         albuterol 108 (90 BASE) MCG/ACT inhaler  Commonly known as:  PROVENTIL HFA;VENTOLIN HFA  Inhale 2 puffs into the lungs every 6 (six) hours as needed for wheezing.     CALCIUM + D PO  Take 1 tablet by mouth daily.     cefUROXime 500 MG tablet  Commonly known as:  CEFTIN  Take 1 tablet (500 mg total) by mouth 2 (two) times daily.     dicyclomine 10 MG/5ML syrup  Commonly known as:  BENTYL  Take 20 mg by mouth 4 (four) times daily -  before meals and at bedtime.     diphenoxylate-atropine 2.5-0.025 MG per tablet  Commonly known as:  LOMOTIL  Take 1-2 tablets by mouth  2 (two) times daily as needed for diarrhea or loose stools.     feeding supplement Liqd  Take 1 Container by mouth 2 (two) times daily between meals.     HYDROcodone-acetaminophen 5-325 MG per tablet  Commonly known as:  NORCO/VICODIN  Take 1 tablet by mouth 2 (two) times daily as needed for pain.     multivitamin with minerals Tabs tablet  Take 1 tablet by mouth daily.     nystatin-triamcinolone cream  Commonly known as:  MYCOLOG II  Apply 1 application topically 3 (three) times daily as needed (irritation).     pantoprazole 40 MG tablet  Commonly known as:  PROTONIX  Take 40 mg by mouth 2 (two) times daily as needed (acid reflex).     potassium chloride SA 20 MEQ tablet  Commonly known as:  K-DUR,KLOR-CON  Take 1 tablet (20 mEq total) by mouth 2 (two) times daily.     spironolactone 50 MG tablet  Commonly known as:  ALDACTONE  Take 50 mg  by mouth daily.     VITAMIN B-12 IJ  Inject 1 each as directed every 30 (thirty) days.       Follow-up Information   Follow up with [facility] K, MD. Schedule an appointment as soon as possible for a visit in 1 week.   Contact information:   [address] [place] [address] [phone]       Follow up with REHMAN,NAJEEB U, MD. Schedule an appointment as soon as possible for a visit in 3 days. (To be seen with repeat labs (CBC & BMP).)    Contact information:   [address], [address] [place] [address] [phone]        The results of significant diagnostics from this hospitalization (including imaging, microbiology, ancillary and laboratory) are listed below for reference.    Significant Diagnostic Studies: Ct Abdomen Pelvis Wo Contrast  07/12/2013   *RADIOLOGY REPORT*  Clinical Data: Right-sided abdominal pain with nausea and diarrhea. Prior cholecystectomy and appendectomy.  History of Crohn's disease.  CT ABDOMEN AND PELVIS WITHOUT CONTRAST  Technique:  Multidetector CT imaging of the abdomen and pelvis was performed following the standard protocol without intravenous contrast.  Comparison: 12/26/2012  Findings: A very large hiatal hernia is noted and contains proximally to thirds of the stomach and a segment of the transverse colon.  Although incompletely visualized, there is no evidence for edema or inflammation within the hernia sac.  Liver and spleen have normal uninfused features.  The duodenum, pancreas, and adrenal glands are unremarkable.  Gallbladder is surgically absent.  No stones are seen in either kidney.  No secondary changes in either kidney or ureter.  No abdominal aortic aneurysm.  No lymphadenopathy in the abdomen. No evidence for intraperitoneal free fluid.  Imaging through the pelvis shows no intraperitoneal free fluid. There is no pelvic sidewall lymphadenopathy.  There is an abnormal tract of soft tissue beginning in the region of the anal  verge and tracking posterolaterally to the right into the ischiorectal fat.  Imaging features could be related to a blind ending fistula or linear abscess.  Abnormal soft tissue to the left of the anus may be related to a fistula or scar related to an old perianal fistula.  Bone windows reveal no worrisome lytic or sclerotic osseous lesions.  IMPRESSION: Linear bands of soft tissue attenuation tracking to the right of the rectum and left of the anus.  On the right, this could be  a a linear chronic perirectal linear abscess.  On the left, a peri-anal fistula or scarring from previous fistula could have this appearance.   Original Report Authenticated By: Kennith Center, M.D.   Dg Chest 2 View  07/12/2013   *RADIOLOGY REPORT*  Clinical Data: Shortness of breath, right-sided abdominal pain and diarrhea  CHEST - 2 VIEW  Comparison: Chest x-ray of 04/28/2013  Findings: The lungs remain clear.  The heart is within normal limits in size.  A moderate sized hiatal hernia again is noted.  An old right posterolateral seventh rib fracture is noted.  IMPRESSION: No active lung disease.  Moderate sized hiatal hernia remains.   Original Report Authenticated By: Dwyane Dee, M.D.     Microbiology: Recent Results (from the past 240 hour(s))  URINE CULTURE     Status: None   Collection Time    07/12/13  2:57 PM      Result Value Range Status   Specimen Description URINE, RANDOM   Final   Special Requests NONE   Final   Culture  Setup Time     Final   Value: 07/12/2013 16:12     Performed at [facility] Count     Final   Value: >=100,000 COLONIES/ML     Performed at [facility]   Culture     Final   Value: KLEBSIELLA PNEUMONIAE     Performed at [facility]   Report Status 07/14/2013 FINAL   Final   Organism ID, Bacteria KLEBSIELLA PNEUMONIAE   Final  MRSA PCR SCREENING     Status: None   Collection Time    07/13/13  1:26 AM      Result Value Range Status   MRSA by PCR  NEGATIVE  NEGATIVE Final   Comment:            The GeneXpert MRSA Assay (FDA     approved for NASAL specimens     only), is one component of a     comprehensive MRSA colonization     surveillance program. It is not     intended to diagnose MRSA     infection nor to guide or     monitor treatment for     MRSA infections.  CLOSTRIDIUM DIFFICILE BY PCR     Status: None   Collection Time    07/13/13  7:06 AM      Result Value Range Status   C difficile by pcr NEGATIVE  NEGATIVE Final  STOOL CULTURE     Status: None   Collection Time    07/13/13  7:07 AM      Result Value Range Status   Specimen Description STOOL   Final   Special Requests NONE   Final   Culture     Final   Value: ABUNDANT YEAST NO SUSPICIOUS COLONIES, CONTINUING TO HOLD     Note: REDUCED NORMAL FLORA PRESENT     Performed at [facility]   Report Status PENDING   Incomplete  OVA AND PARASITE EXAMINATION     Status: None   Collection Time    07/13/13  7:07 AM      Result Value Range Status   Specimen Description STOOL   Final   Special Requests NONE   Final   Ova and parasites     Final   Value: NO OVA OR PARASITES SEEN     Performed at [facility]   Report Status  07/14/2013 FINAL   Final     Labs: Basic Metabolic Panel:  Recent Labs Lab 07/12/13 1541  07/13/13 1615 07/14/13 0556 07/14/13 1400 07/15/13 0500 07/15/13 1843 07/16/13 0500  NA  --   < > 134* 138  --  138 136 139  K <2.0*  < > 2.3* 2.7* 3.0* 2.8* 4.0 3.3*  CL  --   < > 108 107  --  99 97 101  CO2  --   < > 12* 19  --  31 31 31   GLUCOSE  --   < > 157* 88  --  92 92 81  BUN  --   < > 14 10  --  5* 7 7  CREATININE  --   < > 1.30* 1.24*  --  1.15* 1.21* 1.30*  CALCIUM  --   < > 7.0* 7.4*  --  6.9* 7.1* 6.8*  MG 1.3*  --   --   --  1.9  --   --   --   < > = values in this interval not displayed. Liver Function Tests:  Recent Labs Lab 07/12/13 1202 07/15/13 0500  AST 28  --   ALT 26  --   ALKPHOS 87  --   BILITOT  0.5  --   PROT 7.8  --   ALBUMIN 3.3* 2.2*    Recent Labs Lab 07/12/13 1202 07/13/13 0502  LIPASE 61* 42   No results found for this basename: AMMONIA,  in the last 168 hours CBC:  Recent Labs Lab 07/12/13 1202 07/13/13 0502 07/14/13 0556 07/15/13 0500 07/16/13 0500  WBC 10.3 16.0* 10.3 12.1* 10.2  NEUTROABS 8.4* 11.9*  --   --   --   HGB 13.2 12.2 10.6* 9.3* 8.8*  HCT 36.6 33.2* 29.1* 26.6* 25.2*  MCV 78.0 77.6* 76.6* 79.6 81.3  PLT 381 364 282 216 200   Cardiac Enzymes:  Recent Labs Lab 07/12/13 1202  TROPONINI <0.30   BNP: BNP (last 3 results)  Recent Labs  02/24/13 1625 04/28/13 2305  PROBNP 511.1* 215.7*   CBG: No results found for this basename: GLUCAP,  in the last 168 hours  Additional labs:  Admitting BMP: Sodium 134, potassium <2, chloride 103, bicarbonate 11, BUN 26, creatinine 1.92    Signed:  Kamaria Lucia  [facility] 07/16/2013, 1:26 PM

## 2013-07-18 ENCOUNTER — Ambulatory Visit (HOSPITAL_COMMUNITY): Payer: Medicare Other

## 2013-07-18 ENCOUNTER — Encounter (HOSPITAL_COMMUNITY)
Admission: RE | Admit: 2013-07-18 | Discharge: 2013-07-18 | Disposition: A | Discharge status: Home / Self Care | Payer: Medicaid Other | Source: Ambulatory Visit | Attending: Internal Medicine | Admitting: Internal Medicine

## 2013-07-24 ENCOUNTER — Other Ambulatory Visit (INDEPENDENT_AMBULATORY_CARE_PROVIDER_SITE_OTHER): Payer: Self-pay | Admitting: Internal Medicine

## 2013-07-25 ENCOUNTER — Encounter (HOSPITAL_COMMUNITY): Admission: RE | Admit: 2013-07-25 | Payer: Medicare Other | Source: Ambulatory Visit

## 2013-07-27 DIAGNOSIS — M109 Gout, unspecified: Secondary | ICD-10-CM | POA: Diagnosis not present

## 2013-07-27 DIAGNOSIS — M25539 Pain in unspecified wrist: Secondary | ICD-10-CM | POA: Diagnosis not present

## 2013-07-27 DIAGNOSIS — M810 Age-related osteoporosis without current pathological fracture: Secondary | ICD-10-CM | POA: Diagnosis not present

## 2013-07-27 DIAGNOSIS — K5090 Crohn's disease, unspecified, without complications: Secondary | ICD-10-CM | POA: Diagnosis not present

## 2013-07-31 ENCOUNTER — Encounter (HOSPITAL_COMMUNITY)
Admission: RE | Admit: 2013-07-31 | Discharge: 2013-07-31 | Disposition: A | Discharge status: Home / Self Care | Payer: Medicare Other | Source: Ambulatory Visit | Attending: Internal Medicine | Admitting: Internal Medicine

## 2013-07-31 DIAGNOSIS — K5090 Crohn's disease, unspecified, without complications: Secondary | ICD-10-CM | POA: Insufficient documentation

## 2013-07-31 MED ORDER — LORATADINE 10 MG PO TABS
ORAL_TABLET | ORAL | Status: AC
  Filled 2013-07-31: qty 1

## 2013-07-31 MED ORDER — SODIUM CHLORIDE 0.9 % IV SOLN: 5.0000 mg/kg | INTRAVENOUS | Status: DC

## 2013-07-31 MED ORDER — ACETAMINOPHEN 325 MG PO TABS
ORAL_TABLET | ORAL | Status: AC
  Filled 2013-07-31: qty 2

## 2013-07-31 MED ORDER — SODIUM CHLORIDE 0.9 % IV SOLN
5.0000 mg/kg | Freq: Once | INTRAVENOUS | Status: AC
  Administered 2013-07-31: 300 mg via INTRAVENOUS
  Filled 2013-07-31: qty 30

## 2013-07-31 MED ORDER — ACETAMINOPHEN 325 MG PO TABS
650.0000 mg | ORAL_TABLET | Freq: Four times a day (QID) | ORAL | Status: DC | PRN
  Administered 2013-07-31: 650 mg via ORAL

## 2013-07-31 MED ORDER — LORATADINE 10 MG PO TABS
10.0000 mg | ORAL_TABLET | Freq: Every day | ORAL | Status: DC
  Administered 2013-07-31: 10 mg via ORAL

## 2013-07-31 MED ORDER — SODIUM CHLORIDE 0.9 % IV SOLN
INTRAVENOUS | Status: DC
  Administered 2013-07-31: 10:00:00 via INTRAVENOUS

## 2013-08-01 ENCOUNTER — Ambulatory Visit (INDEPENDENT_AMBULATORY_CARE_PROVIDER_SITE_OTHER): Payer: Medicare Other | Admitting: Internal Medicine

## 2013-08-03 DIAGNOSIS — Z79899 Other long term (current) drug therapy: Secondary | ICD-10-CM | POA: Diagnosis not present

## 2013-08-17 ENCOUNTER — Other Ambulatory Visit (INDEPENDENT_AMBULATORY_CARE_PROVIDER_SITE_OTHER): Payer: Self-pay | Admitting: Internal Medicine

## 2013-08-20 ENCOUNTER — Other Ambulatory Visit (INDEPENDENT_AMBULATORY_CARE_PROVIDER_SITE_OTHER): Payer: Self-pay | Admitting: Internal Medicine

## 2013-08-22 ENCOUNTER — Encounter (HOSPITAL_COMMUNITY): Payer: Self-pay | Admitting: *Deleted

## 2013-08-22 ENCOUNTER — Observation Stay (HOSPITAL_COMMUNITY): Payer: Medicare Other

## 2013-08-22 ENCOUNTER — Inpatient Hospital Stay (HOSPITAL_COMMUNITY)
Admission: EM | Admit: 2013-08-22 | Discharge: 2013-08-23 | DRG: 641 | Disposition: A | Discharge status: Home / Self Care | Payer: Medicare Other | Attending: Family Medicine | Admitting: Family Medicine

## 2013-08-22 DIAGNOSIS — N179 Acute kidney failure, unspecified: Secondary | ICD-10-CM

## 2013-08-22 DIAGNOSIS — M2550 Pain in unspecified joint: Secondary | ICD-10-CM | POA: Diagnosis present

## 2013-08-22 DIAGNOSIS — R1310 Dysphagia, unspecified: Secondary | ICD-10-CM

## 2013-08-22 DIAGNOSIS — Z23 Encounter for immunization: Secondary | ICD-10-CM

## 2013-08-22 DIAGNOSIS — K449 Diaphragmatic hernia without obstruction or gangrene: Secondary | ICD-10-CM

## 2013-08-22 DIAGNOSIS — J9811 Atelectasis: Secondary | ICD-10-CM

## 2013-08-22 DIAGNOSIS — K5090 Crohn's disease, unspecified, without complications: Secondary | ICD-10-CM | POA: Diagnosis present

## 2013-08-22 DIAGNOSIS — R141 Gas pain: Secondary | ICD-10-CM | POA: Diagnosis not present

## 2013-08-22 DIAGNOSIS — E876 Hypokalemia: Secondary | ICD-10-CM | POA: Diagnosis present

## 2013-08-22 DIAGNOSIS — K219 Gastro-esophageal reflux disease without esophagitis: Secondary | ICD-10-CM

## 2013-08-22 DIAGNOSIS — R112 Nausea with vomiting, unspecified: Secondary | ICD-10-CM

## 2013-08-22 DIAGNOSIS — R0602 Shortness of breath: Secondary | ICD-10-CM

## 2013-08-22 DIAGNOSIS — E872 Acidosis, unspecified: Secondary | ICD-10-CM | POA: Diagnosis present

## 2013-08-22 DIAGNOSIS — R109 Unspecified abdominal pain: Secondary | ICD-10-CM | POA: Diagnosis not present

## 2013-08-22 DIAGNOSIS — D473 Essential (hemorrhagic) thrombocythemia: Secondary | ICD-10-CM

## 2013-08-22 DIAGNOSIS — K611 Rectal abscess: Secondary | ICD-10-CM

## 2013-08-22 DIAGNOSIS — D649 Anemia, unspecified: Secondary | ICD-10-CM | POA: Diagnosis present

## 2013-08-22 DIAGNOSIS — D72829 Elevated white blood cell count, unspecified: Secondary | ICD-10-CM

## 2013-08-22 DIAGNOSIS — R05 Cough: Secondary | ICD-10-CM

## 2013-08-22 DIAGNOSIS — M545 Low back pain: Secondary | ICD-10-CM

## 2013-08-22 DIAGNOSIS — R432 Parageusia: Secondary | ICD-10-CM

## 2013-08-22 DIAGNOSIS — R197 Diarrhea, unspecified: Secondary | ICD-10-CM | POA: Diagnosis not present

## 2013-08-22 DIAGNOSIS — R079 Chest pain, unspecified: Secondary | ICD-10-CM

## 2013-08-22 DIAGNOSIS — K56609 Unspecified intestinal obstruction, unspecified as to partial versus complete obstruction: Secondary | ICD-10-CM

## 2013-08-22 DIAGNOSIS — M549 Dorsalgia, unspecified: Secondary | ICD-10-CM

## 2013-08-22 DIAGNOSIS — E860 Dehydration: Principal | ICD-10-CM | POA: Diagnosis present

## 2013-08-22 DIAGNOSIS — K6389 Other specified diseases of intestine: Secondary | ICD-10-CM | POA: Diagnosis not present

## 2013-08-22 DIAGNOSIS — N390 Urinary tract infection, site not specified: Secondary | ICD-10-CM

## 2013-08-22 DIAGNOSIS — R531 Weakness: Secondary | ICD-10-CM

## 2013-08-22 DIAGNOSIS — E871 Hypo-osmolality and hyponatremia: Secondary | ICD-10-CM

## 2013-08-22 LAB — URINE MICROSCOPIC-ADD ON

## 2013-08-22 LAB — CBC WITH DIFFERENTIAL/PLATELET
Basophils Relative: 0 % (ref 0–1)
Eosinophils Absolute: 0 10*3/uL (ref 0.0–0.7)
Lymphs Abs: 2.8 10*3/uL (ref 0.7–4.0)
MCH: 28.8 pg (ref 26.0–34.0)
MCHC: 34.2 g/dL (ref 30.0–36.0)
Neutro Abs: 6.5 10*3/uL (ref 1.7–7.7)
Neutrophils Relative %: 65 % (ref 43–77)
Platelets: 357 10*3/uL (ref 150–400)
RBC: 4.16 MIL/uL (ref 3.87–5.11)

## 2013-08-22 LAB — URINALYSIS, ROUTINE W REFLEX MICROSCOPIC
Glucose, UA: NEGATIVE mg/dL
Protein, ur: 100 mg/dL — AB
Specific Gravity, Urine: 1.014 (ref 1.005–1.030)
Specific Gravity, Urine: 1.008 (ref 1.005–1.030)
Urobilinogen, UA: 0.2 mg/dL (ref 0.0–1.0)

## 2013-08-22 LAB — OCCULT BLOOD X 1 CARD TO LAB, STOOL: Fecal Occult Bld: POSITIVE — AB

## 2013-08-22 LAB — CBC
HCT: 34.5 % — ABNORMAL LOW (ref 36.0–46.0)
Hemoglobin: 11.7 g/dL — ABNORMAL LOW (ref 12.0–15.0)
MCHC: 33.9 g/dL (ref 30.0–36.0)
MCV: 84.1 fL (ref 78.0–100.0)
RDW: 16.3 % — ABNORMAL HIGH (ref 11.5–15.5)
WBC: 11.1 10*3/uL — ABNORMAL HIGH (ref 4.0–10.5)

## 2013-08-22 LAB — BASIC METABOLIC PANEL
BUN: 18 mg/dL (ref 6–23)
CO2: 10 mEq/L — CL (ref 19–32)
Calcium: 7.9 mg/dL — ABNORMAL LOW (ref 8.4–10.5)
Creatinine, Ser: 1.45 mg/dL — ABNORMAL HIGH (ref 0.50–1.10)
GFR calc non Af Amer: 37 mL/min — ABNORMAL LOW (ref >90)
Glucose, Bld: 80 mg/dL (ref 70–99)
Sodium: 133 mEq/L — ABNORMAL LOW (ref 135–145)

## 2013-08-22 LAB — COMPREHENSIVE METABOLIC PANEL
AST: 15 U/L (ref 0–37)
CO2: 12 mEq/L — ABNORMAL LOW (ref 19–32)
Calcium: 8.2 mg/dL — ABNORMAL LOW (ref 8.4–10.5)
Chloride: 107 mEq/L (ref 96–112)
Creatinine, Ser: 1.5 mg/dL — ABNORMAL HIGH (ref 0.50–1.10)
GFR calc Af Amer: 41 mL/min — ABNORMAL LOW (ref >90)
GFR calc non Af Amer: 35 mL/min — ABNORMAL LOW (ref >90)
Glucose, Bld: 80 mg/dL (ref 70–99)
Total Bilirubin: 0.4 mg/dL (ref 0.3–1.2)

## 2013-08-22 LAB — LACTIC ACID, PLASMA: Lactic Acid, Venous: 1.2 mmol/L (ref 0.5–2.2)

## 2013-08-22 MED ORDER — ALBUTEROL SULFATE HFA 108 (90 BASE) MCG/ACT IN AERS: 2.0000 | INHALATION_SPRAY | Freq: Four times a day (QID) | RESPIRATORY_TRACT | Status: DC | PRN

## 2013-08-22 MED ORDER — HYDROMORPHONE HCL PF 1 MG/ML IJ SOLN
0.5000 mg | Freq: Once | INTRAMUSCULAR | Status: AC
  Administered 2013-08-22: 0.5 mg via INTRAVENOUS
  Filled 2013-08-22: qty 1

## 2013-08-22 MED ORDER — MAGNESIUM SULFATE 40 MG/ML IJ SOLN
2.0000 g | Freq: Once | INTRAMUSCULAR | Status: AC
  Administered 2013-08-22: 19:00:00 2 g via INTRAVENOUS
  Filled 2013-08-22: qty 50

## 2013-08-22 MED ORDER — DICYCLOMINE HCL 10 MG/5ML PO SOLN
20.0000 mg | Freq: Three times a day (TID) | ORAL | Status: DC | PRN
  Filled 2013-08-22: qty 10

## 2013-08-22 MED ORDER — METRONIDAZOLE IN NACL 5-0.79 MG/ML-% IV SOLN
500.0000 mg | Freq: Three times a day (TID) | INTRAVENOUS | Status: DC
  Administered 2013-08-22 – 2013-08-23 (×4): 500 mg via INTRAVENOUS
  Filled 2013-08-22 (×6): qty 100

## 2013-08-22 MED ORDER — BOOST / RESOURCE BREEZE PO LIQD: 1.0000 | Freq: Two times a day (BID) | ORAL | Status: DC

## 2013-08-22 MED ORDER — POTASSIUM CHLORIDE 10 MEQ/100ML IV SOLN
10.0000 meq | INTRAVENOUS | Status: AC
  Administered 2013-08-22 (×2): 10 meq via INTRAVENOUS
  Filled 2013-08-22 (×2): qty 100

## 2013-08-22 MED ORDER — ACETAMINOPHEN 325 MG PO TABS: 650.0000 mg | ORAL_TABLET | Freq: Four times a day (QID) | ORAL | Status: DC | PRN

## 2013-08-22 MED ORDER — SODIUM CHLORIDE 0.9 % IJ SOLN
3.0000 mL | Freq: Two times a day (BID) | INTRAMUSCULAR | Status: DC
  Administered 2013-08-22: 3 mL via INTRAVENOUS

## 2013-08-22 MED ORDER — PANTOPRAZOLE SODIUM 40 MG IV SOLR
40.0000 mg | INTRAVENOUS | Status: AC
  Administered 2013-08-22: 40 mg via INTRAVENOUS
  Filled 2013-08-22: qty 40

## 2013-08-22 MED ORDER — ENOXAPARIN SODIUM 40 MG/0.4ML ~~LOC~~ SOLN
40.0000 mg | SUBCUTANEOUS | Status: DC
  Administered 2013-08-22: 40 mg via SUBCUTANEOUS
  Filled 2013-08-22 (×2): qty 0.4

## 2013-08-22 MED ORDER — PANTOPRAZOLE SODIUM 40 MG PO TBEC
40.0000 mg | DELAYED_RELEASE_TABLET | Freq: Two times a day (BID) | ORAL | Status: DC
  Administered 2013-08-22 – 2013-08-23 (×3): 40 mg via ORAL
  Filled 2013-08-22 (×3): qty 1

## 2013-08-22 MED ORDER — HYDROMORPHONE HCL PF 1 MG/ML IJ SOLN
0.5000 mg | INTRAMUSCULAR | Status: DC | PRN
  Administered 2013-08-22: 0.5 mg via INTRAVENOUS
  Filled 2013-08-22 (×2): qty 1

## 2013-08-22 MED ORDER — ONDANSETRON HCL 4 MG/2ML IJ SOLN
4.0000 mg | Freq: Once | INTRAMUSCULAR | Status: AC
  Administered 2013-08-22: 4 mg via INTRAVENOUS
  Filled 2013-08-22: qty 2

## 2013-08-22 MED ORDER — ONDANSETRON HCL 4 MG/2ML IJ SOLN: 4.0000 mg | Freq: Four times a day (QID) | INTRAMUSCULAR | Status: DC | PRN

## 2013-08-22 MED ORDER — SODIUM BICARBONATE 8.4 % IV SOLN
INTRAVENOUS | Status: DC
  Administered 2013-08-22 – 2013-08-23 (×2): via INTRAVENOUS
  Filled 2013-08-22 (×6): qty 150

## 2013-08-22 MED ORDER — ONDANSETRON HCL 4 MG/2ML IJ SOLN: 4.0000 mg | Freq: Three times a day (TID) | INTRAMUSCULAR | Status: DC | PRN

## 2013-08-22 MED ORDER — DIPHENOXYLATE-ATROPINE 2.5-0.025 MG PO TABS
1.0000 | ORAL_TABLET | Freq: Two times a day (BID) | ORAL | Status: DC | PRN
  Administered 2013-08-22 – 2013-08-23 (×3): 2 via ORAL
  Filled 2013-08-22 (×3): qty 2

## 2013-08-22 MED ORDER — PREDNISONE 20 MG PO TABS
40.0000 mg | ORAL_TABLET | Freq: Every day | ORAL | Status: DC
  Administered 2013-08-23: 40 mg via ORAL
  Filled 2013-08-22 (×2): qty 2

## 2013-08-22 MED ORDER — METHYLPREDNISOLONE SODIUM SUCC 125 MG IJ SOLR
125.0000 mg | Freq: Once | INTRAMUSCULAR | Status: AC
  Administered 2013-08-22: 16:00:00 125 mg via INTRAVENOUS
  Filled 2013-08-22: qty 2

## 2013-08-22 MED ORDER — ACETAMINOPHEN 650 MG RE SUPP: 650.0000 mg | Freq: Four times a day (QID) | RECTAL | Status: DC | PRN

## 2013-08-22 MED ORDER — SODIUM CHLORIDE 0.9 % IV BOLUS (SEPSIS)
1000.0000 mL | Freq: Once | INTRAVENOUS | Status: AC
  Administered 2013-08-22: 1000 mL via INTRAVENOUS

## 2013-08-22 MED ORDER — NYSTATIN-TRIAMCINOLONE 100000-0.1 UNIT/GM-% EX CREA
1.0000 "application " | TOPICAL_CREAM | Freq: Three times a day (TID) | CUTANEOUS | Status: DC | PRN
  Administered 2013-08-22: 1 via TOPICAL
  Filled 2013-08-22: qty 15

## 2013-08-22 MED ORDER — HYDROMORPHONE HCL PF 1 MG/ML IJ SOLN: 0.5000 mg | INTRAMUSCULAR | Status: AC | PRN

## 2013-08-22 MED ORDER — INFLUENZA VAC SPLIT QUAD 0.5 ML IM SUSP
0.5000 mL | INTRAMUSCULAR | Status: AC
  Administered 2013-08-23: 10:00:00 0.5 mL via INTRAMUSCULAR
  Filled 2013-08-22: qty 0.5

## 2013-08-22 MED ORDER — POTASSIUM CHLORIDE CRYS ER 20 MEQ PO TBCR
40.0000 meq | EXTENDED_RELEASE_TABLET | Freq: Once | ORAL | Status: AC
  Administered 2013-08-22: 40 meq via ORAL
  Filled 2013-08-22: qty 2

## 2013-08-22 MED ORDER — CIPROFLOXACIN IN D5W 400 MG/200ML IV SOLN
400.0000 mg | Freq: Two times a day (BID) | INTRAVENOUS | Status: DC
  Administered 2013-08-22: 22:00:00 400 mg via INTRAVENOUS
  Filled 2013-08-22 (×4): qty 200

## 2013-08-22 MED ORDER — HYDROCODONE-ACETAMINOPHEN 5-325 MG PO TABS: 1.0000 | ORAL_TABLET | Freq: Four times a day (QID) | ORAL | Status: DC | PRN

## 2013-08-22 MED ORDER — POTASSIUM CHLORIDE CRYS ER 20 MEQ PO TBCR
40.0000 meq | EXTENDED_RELEASE_TABLET | Freq: Two times a day (BID) | ORAL | Status: DC
  Administered 2013-08-22: 23:00:00 40 meq via ORAL
  Filled 2013-08-22 (×3): qty 2

## 2013-08-22 MED ORDER — ADULT MULTIVITAMIN W/MINERALS CH
1.0000 | ORAL_TABLET | Freq: Every day | ORAL | Status: DC
  Administered 2013-08-22 – 2013-08-23 (×2): 1 via ORAL
  Filled 2013-08-22 (×2): qty 1

## 2013-08-22 MED ORDER — POTASSIUM CHLORIDE 10 MEQ/100ML IV SOLN
10.0000 meq | Freq: Once | INTRAVENOUS | Status: AC
  Administered 2013-08-22: 10 meq via INTRAVENOUS
  Filled 2013-08-22: qty 100

## 2013-08-22 MED ORDER — SODIUM BICARBONATE 8.4 % IV SOLN
50.0000 meq | Freq: Once | INTRAVENOUS | Status: AC
  Administered 2013-08-22: 18:00:00 50 meq via INTRAVENOUS
  Filled 2013-08-22: qty 50

## 2013-08-22 MED ORDER — ONDANSETRON HCL 4 MG PO TABS: 4.0000 mg | ORAL_TABLET | Freq: Four times a day (QID) | ORAL | Status: DC | PRN

## 2013-08-22 NOTE — ED Provider Notes (Signed)
CSN: [id number]     Arrival date & time 08/22/13  0803 History   First MD Initiated Contact with Patient 08/22/13 [phone]     Chief Complaint  Patient presents with  . Abdominal Pain  . Emesis  . Diarrhea   (Consider location/radiation/quality/duration/timing/severity/associated sxs/prior Treatment) HPI Comments: Patient is a 67 year old female with a past medical history of Crohn's disease who presents with abdominal pain for the past 2 days. The pain is located in her generalized abdomen and does not radiate. The pain is described as aching and severe. The pain started gradually and progressively worsened since the onset. No alleviating/aggravating factors. The patient has tried nothing for symptoms without relief. Associated symptoms include NVD. Patient denies fever, headache, chest pain, SOB, dysuria, constipation.    Patient is a 67 y.o. female presenting with abdominal pain, vomiting, and diarrhea.  Abdominal Pain Associated symptoms: diarrhea, nausea and vomiting   Emesis Associated symptoms: abdominal pain and diarrhea   Diarrhea Associated symptoms: abdominal pain and vomiting     Past Medical History  Diagnosis Date  . Crohn's disease   . Chronic diarrhea   . Joint pain   . GERD (gastroesophageal reflux disease)   . Polyarthralgia   . Hiatal hernia   . Hemorrhoids   . Anemia   . Hernia (acquired) (recurrent)     hiatal  . Pneumonia 2014  . Exertional shortness of breath     "q once in awhile" (07/13/2013)  . Arthritis     "all over" (07/13/2013)   Past Surgical History  Procedure Laterality Date  . Bowel resection    . Colostomy      [name]  . Anal stenosis    . Colonoscopy  2000    needs to have one  . Cholecystectomy    . Appendectomy    . Goiter  1999  . Esophagogastroduodenoscopy  11/11/2011    Procedure: ESOPHAGOGASTRODUODENOSCOPY (EGD);  Surgeon: Barrie Folk, MD;  Location: [facility] ENDOSCOPY;  Service: Endoscopy;  Laterality: N/A;  . Flexible sigmoidoscopy   11/11/2011    Procedure: FLEXIBLE SIGMOIDOSCOPY;  Surgeon: Barrie Folk, MD;  Location: [facility] ENDOSCOPY;  Service: Endoscopy;  Laterality: N/A;  . Esophagogastroduodenoscopy  05/18/2012    Procedure: ESOPHAGOGASTRODUODENOSCOPY (EGD);  Surgeon: Malissa Hippo, MD;  Location: [facility] ENDO SUITE;  Service: Endoscopy;  Laterality: N/A;  200   Family History  Problem Relation Age of Onset  . Diabetes Mother   . Healthy Sister   . Hypertension Brother   . Colon cancer Brother     died with colon cancer   History  Substance Use Topics  . Smoking status: Former Smoker -- 0.12 packs/day for 4 years    Types: Cigarettes    Quit date: 10/05/1996  . Smokeless tobacco: Never Used     Comment: 1 pack every two weeks when she smoked  . Alcohol Use: No     Comment: denies   OB History   Grav Para Term Preterm Abortions TAB SAB Ect Mult Living                 Review of Systems  Gastrointestinal: Positive for nausea, vomiting, abdominal pain and diarrhea.  All other systems reviewed and are negative.    Allergies  Aspirin and Sulfa antibiotics  Home Medications   Current Outpatient Rx  Name  Route  Sig  Dispense  Refill  . albuterol (PROVENTIL HFA;VENTOLIN HFA) 108 (90 BASE) MCG/ACT inhaler   Inhalation  Inhale 2 puffs into the lungs every 6 (six) hours as needed for wheezing.   1 Inhaler   2   . Calcium Carbonate-Vitamin D (CALCIUM + D PO)   Oral   Take 1 tablet by mouth daily.         [name] dicyclomine (BENTYL) 10 MG/5ML syrup   Oral   Take 20 mg by mouth 3 (three) times daily as needed (for irritable bowel).          . diphenoxylate-atropine (LOMOTIL) 2.5-0.025 MG per tablet   Oral   Take 1-2 tablets by mouth 2 (two) times daily as needed for diarrhea or loose stools.   30 tablet   0   . feeding supplement (RESOURCE BREEZE) LIQD   Oral   Take 1 Container by mouth 2 (two) times daily between meals.         [name] HYDROcodone-acetaminophen (NORCO/VICODIN) 5-325 MG per tablet    Oral   Take 1 tablet by mouth 2 (two) times daily as needed for pain.   40 tablet   0   . Multiple Vitamin (MULTIVITAMIN WITH MINERALS) TABS   Oral   Take 1 tablet by mouth daily.         [name] nystatin-triamcinolone (MYCOLOG II) cream   Topical   Apply 1 application topically 3 (three) times daily as needed (irritation).         . ondansetron (ZOFRAN) 4 MG tablet      TAKE 1 TABLET (4 MG TOTAL) BY MOUTH EVERY 12 (TWELVE) HOURS AS NEEDED FOR NAUSEA.   30 tablet   0   . pantoprazole (PROTONIX) 40 MG tablet   Oral   Take 40 mg by mouth 2 (two) times daily as needed (acid reflex).         . potassium chloride SA (K-DUR,KLOR-CON) 20 MEQ tablet   Oral   Take 1 tablet (20 mEq total) by mouth 2 (two) times daily.   30 tablet   2   . predniSONE (DELTASONE) 10 MG tablet      TAKE 0.5 TABLETS (5 MG TOTAL) BY MOUTH DAILY.   30 tablet   0   . spironolactone (ALDACTONE) 50 MG tablet   Oral   Take 50 mg by mouth daily.          . Cyanocobalamin (VITAMIN B-12 IJ)   Injection   Inject 1 each as directed every 30 (thirty) days. Around the 1st week of each month          BP 101/68  Pulse 92  Temp(Src) 98.1 F (36.7 C) (Oral)  Resp 16  SpO2 100% Physical Exam  Nursing note and vitals reviewed. Constitutional: She is oriented to person, place, and time. She appears well-developed and well-nourished. No distress.  HENT:  Head: Normocephalic and atraumatic.  Eyes: Conjunctivae and EOM are normal. Pupils are equal, round, and reactive to light.  Neck: Normal range of motion.  Cardiovascular: Normal rate and regular rhythm.  Exam reveals no gallop and no friction rub.   No murmur heard. Pulmonary/Chest: Effort normal and breath sounds normal. She has no wheezes. She has no rales. She exhibits no tenderness.  Abdominal: Soft. She exhibits no distension. There is tenderness. There is no rebound and no guarding.  Epigastric tenderness to palpation. No peritoneal signs or other  focal tenderness to palpation.   Musculoskeletal: Normal range of motion.  Neurological: She is alert and oriented to person, place, and time. Coordination normal.  Speech is goal-oriented.  Moves limbs without ataxia.   Skin: Skin is warm and dry.  Psychiatric: She has a normal mood and affect. Her behavior is normal.    ED Course  Procedures (including critical care time)   Date: 08/22/2013  Rate: 88  Rhythm: normal sinus rhythm  QRS Axis: normal  Intervals: normal  ST/T Wave abnormalities: normal  Conduction Disutrbances:none  Narrative Interpretation: NSR without acute changes   Old EKG Reviewed: none available    Labs Review Labs Reviewed  COMPREHENSIVE METABOLIC PANEL - Abnormal; Notable for the following:    Potassium 2.6 (*)    CO2 12 (*)    Creatinine, Ser 1.50 (*)    Calcium 8.2 (*)    Total Protein 8.6 (*)    Alkaline Phosphatase 151 (*)    GFR calc non Af Amer 35 (*)    GFR calc Af Amer 41 (*)    All other components within normal limits  CBC WITH DIFFERENTIAL - Abnormal; Notable for the following:    HCT 35.1 (*)    RDW 16.4 (*)    All other components within normal limits  URINALYSIS, ROUTINE W REFLEX MICROSCOPIC - Abnormal; Notable for the following:    Hgb urine dipstick TRACE (*)    Bilirubin Urine SMALL (*)    Protein, ur 100 (*)    Leukocytes, UA SMALL (*)    All other components within normal limits  URINE MICROSCOPIC-ADD ON - Abnormal; Notable for the following:    Bacteria, UA FEW (*)    Casts HYALINE CASTS (*)    All other components within normal limits  URINE CULTURE  LIPASE, BLOOD   Imaging Review No results found.  MDM   1. Hypokalemia, gastrointestinal losses     9:18 AM Labs and urinalysis pending. Patient will have fluids, dilaudid and zofran for symptoms. Vitals stable and patient afebrile.   10:31 AM Patient's labs show hypokalemia at 2.6. Patient has an anion gap of 17. Patient will have EKG. Other labs unremarkable.  Patient will be admitted for observation.   Emilia Beck, PA-C 08/22/13 1105

## 2013-08-22 NOTE — Progress Notes (Signed)
CRITICAL VALUE ALERT  Critical value received:  CO2 10  Date of notification:  08/22/13  Time of notification:  16:10  Critical value read back:yes  Nurse who received alert: Larina Bras  MD notified (1st page):  MD Short  Time of first page: 16:37  MD notified (2nd page): N/A  Time of second page: N/A  Responding MD:  MD Short  Time MD responded: 16:39

## 2013-08-22 NOTE — Progress Notes (Signed)
ANTIBIOTIC CONSULT NOTE - INITIAL  Pharmacy Consult for Cipro Indication: diverticulitis, flare of Crohn's disease  Allergies  Allergen Reactions  . Aspirin Other (See Comments)    Cramps her stomach  . Sulfa Antibiotics Nausea And Vomiting    Patient Measurements:   Weight: 58 kg  Vital Signs: Temp: 98.1 F (36.7 C) (09/16 0816) Temp src: Oral (09/16 0816) BP: 104/63 mmHg (09/16 1237) Pulse Rate: 88 (09/16 1237) Intake/Output from previous day:   Intake/Output from this shift:    Labs:  Recent Labs  08/22/13 0930  WBC 10.0  HGB 12.0  PLT 357  CREATININE 1.50*   Estimated CrCl ~ 42 ml/min   Microbiology: No results found for this or any previous visit (from the past 720 hour(s)).  Medical History: Past Medical History  Diagnosis Date  . Crohn's disease   . Chronic diarrhea   . Joint pain   . GERD (gastroesophageal reflux disease)   . Polyarthralgia   . Hiatal hernia   . Hemorrhoids   . Anemia   . Hernia (acquired) (recurrent)     hiatal  . Pneumonia 2014  . Exertional shortness of breath     "q once in awhile" (07/13/2013)  . Arthritis     "all over" (07/13/2013)    Medications:  Prescriptions prior to admission  Medication Sig Dispense Refill  . albuterol (PROVENTIL HFA;VENTOLIN HFA) 108 (90 BASE) MCG/ACT inhaler Inhale 2 puffs into the lungs every 6 (six) hours as needed for wheezing.  1 Inhaler  2  . Calcium Carbonate-Vitamin D (CALCIUM + D PO) Take 1 tablet by mouth daily.      [name] dicyclomine (BENTYL) 10 MG/5ML syrup Take 20 mg by mouth 3 (three) times daily as needed (for irritable bowel).       . diphenoxylate-atropine (LOMOTIL) 2.5-0.025 MG per tablet Take 1-2 tablets by mouth 2 (two) times daily as needed for diarrhea or loose stools.  30 tablet  0  . feeding supplement (RESOURCE BREEZE) LIQD Take 1 Container by mouth 2 (two) times daily between meals.      [name] HYDROcodone-acetaminophen (NORCO/VICODIN) 5-325 MG per tablet Take 1 tablet by mouth 2  (two) times daily as needed for pain.  40 tablet  0  . Multiple Vitamin (MULTIVITAMIN WITH MINERALS) TABS Take 1 tablet by mouth daily.      [name] nystatin-triamcinolone (MYCOLOG II) cream Apply 1 application topically 3 (three) times daily as needed (irritation).      . ondansetron (ZOFRAN) 4 MG tablet TAKE 1 TABLET (4 MG TOTAL) BY MOUTH EVERY 12 (TWELVE) HOURS AS NEEDED FOR NAUSEA.  30 tablet  0  . pantoprazole (PROTONIX) 40 MG tablet Take 40 mg by mouth 2 (two) times daily as needed (acid reflex).      . potassium chloride SA (K-DUR,KLOR-CON) 20 MEQ tablet Take 1 tablet (20 mEq total) by mouth 2 (two) times daily.  30 tablet  2  . predniSONE (DELTASONE) 10 MG tablet TAKE 0.5 TABLETS (5 MG TOTAL) BY MOUTH DAILY.  30 tablet  0  . spironolactone (ALDACTONE) 50 MG tablet Take 50 mg by mouth daily.       . Cyanocobalamin (VITAMIN B-12 IJ) Inject 1 each as directed every 30 (thirty) days. Around the 1st week of each month       Assessment: 67 yo F admitted with 4 day hx of increased diarrhea, abd pain, nausea, and vomiting.  Pt has hx Crohn's disease.  Pt is to start empiric therapy with  Cipro and Flagyl for diverticulitis, gastroenteritis.  Goal of Therapy:  Renal adjustment of medications; Appropriate antibiotic selection  Plan:  Cipro 400 mg IV q12h. Continue Flagyl 500mg  IV q8h as ordered by MD. Follow up culture data, renal function, and clinical progress.  [employer], Pharm.D., BCPS Clinical Pharmacist Pager [phone] 08/22/2013 2:14 PM

## 2013-08-22 NOTE — ED Notes (Signed)
Reports generalized abd pain and n/v/d since sunday

## 2013-08-22 NOTE — H&P (Signed)
[facility] History and Physical  Danielle [name] YNW:[id number] DOB: 06-Apr-1946 DOA: 08/22/2013  Referring physician:  Emilia Beck PCP:  [name], MD   Chief Complaint:  Abdominal pain and N/V/D  HPI:  The patient is a 67 y.o. year-old female with history of well-controlled Crohn's disease, GERD, polyarthritis, chronic anemia who presents with abdominal pain, nausea, vomiting, and diarrhea.  The patient was last at their baseline health 4 days ago.  At baseline she has 2-3 watery soft brown bowel movements daily. 3 days ago, she developed watery diarrhea, 6-7 bowel movements per day.  Two days ago she developed crampy diffuse 10/10 abdominal pain without radiation.  The pain comes and goes. She has continued to have frequent bowel movements and later developed nausea with nonbilious, nonbloody vomiting. She has not been able to tolerate solid foods, however she has been trying to drink plenty of fluids. She is continue to take her twice daily oral potassium. She presented to the emergency department today with persistent nausea, vomiting, diarrhea and abdominal pain.  In the emergency department, her hemoglobin was 12,, BUN 19, creatinine 1.5, calcium 8.2. white blood cell count 10, platelets 375, sodium 136, potassium 2.6, bicarbonate 12.  Her lipase and liver function tests were within normal limits. She was given Dilaudid 0.5 mg IV once, Zofran, Protonix, potassium 10 mEq IV and 40 mEq by mouth, and a normal saline bolus. She is being admitted for evaluation of her diarrhea in the setting of crohn's disease.   Review of Systems:  General:  Denies fevers, chills, weight loss or gain HEENT:  Denies changes to hearing and vision, rhinorrhea, sinus congestion, sore throat CV:  Denies chest pain and palpitations, lower extremity edema.  PULM:  Denies SOB, wheezing, cough.   GI:   per history of present illness .   GU:  Denies dysuria, frequency, urgency ENDO:  Denies polyuria,  polydipsia.   HEME:  Denies hematemesis, blood in stools, melena, abnormal bruising or bleeding.  LYMPH:  Denies lymphadenopathy.   MSK:  Denies arthralgias, myalgias.   DERM:  Denies skin rash or ulcer.   NEURO:  Denies focal numbness, weakness, slurred speech, confusion, facial droop.  PSYCH:  Denies anxiety and depression.    Past Medical History  Diagnosis Date  . Crohn's disease   . Chronic diarrhea   . Joint pain   . GERD (gastroesophageal reflux disease)   . Polyarthralgia   . Hiatal hernia   . Hemorrhoids   . Anemia   . Hernia (acquired) (recurrent)     hiatal  . Pneumonia 2014  . Exertional shortness of breath     "q once in awhile" (07/13/2013)  . Arthritis     "all over" (07/13/2013)   Past Surgical History  Procedure Laterality Date  . Bowel resection  2009  . Colostomy      [name], taken down  . Anal stenosis    . Colonoscopy  2013  . Cholecystectomy    . Appendectomy    . Goiter  1999  . Esophagogastroduodenoscopy  11/11/2011    Procedure: ESOPHAGOGASTRODUODENOSCOPY (EGD);  Surgeon: Barrie Folk, MD;  Location: [facility] ENDOSCOPY;  Service: Endoscopy;  Laterality: N/A;  . Flexible sigmoidoscopy  11/11/2011    Procedure: FLEXIBLE SIGMOIDOSCOPY;  Surgeon: Barrie Folk, MD;  Location: [facility] ENDOSCOPY;  Service: Endoscopy;  Laterality: N/A;  . Esophagogastroduodenoscopy  05/18/2012    Procedure: ESOPHAGOGASTRODUODENOSCOPY (EGD);  Surgeon: Malissa Hippo, MD;  Location: [facility] ENDO SUITE;  Service:  Endoscopy;  Laterality: N/A;  200   Social History:  reports that she quit smoking about 16 years ago. Her smoking use included Cigarettes. She has a .48 pack-year smoking history. She has never used smokeless tobacco. She reports that she does not drink alcohol or use illicit drugs. Lives in [place].  Moved from [place] in 2008.  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  LIves with her sister.   No home health services.   Allergies  Allergen Reactions  . Aspirin Other (See Comments)    Cramps  her stomach  . Sulfa Antibiotics Nausea And Vomiting    Family History  Problem Relation Age of Onset  . Diabetes Mother   . Healthy Sister   . Hypertension Brother   . Colon cancer Brother     died with colon cancer  . Heart disease Father      Prior to Admission medications   Medication Sig Start Date End Date Taking? Authorizing Provider  albuterol (PROVENTIL HFA;VENTOLIN HFA) 108 (90 BASE) MCG/ACT inhaler Inhale 2 puffs into the lungs every 6 (six) hours as needed for wheezing. 02/27/13  Yes Nishant Dhungel, MD  Calcium Carbonate-Vitamin D (CALCIUM + D PO) Take 1 tablet by mouth daily.   Yes Historical Provider, MD  dicyclomine (BENTYL) 10 MG/5ML syrup Take 20 mg by mouth 3 (three) times daily as needed (for irritable bowel).    Yes Historical Provider, MD  diphenoxylate-atropine (LOMOTIL) 2.5-0.025 MG per tablet Take 1-2 tablets by mouth 2 (two) times daily as needed for diarrhea or loose stools. 07/16/13  Yes Elease Etienne, MD  feeding supplement (RESOURCE BREEZE) LIQD Take 1 Container by mouth 2 (two) times daily between meals. 07/16/13  Yes Elease Etienne, MD  HYDROcodone-acetaminophen (NORCO/VICODIN) 5-325 MG per tablet Take 1 tablet by mouth 2 (two) times daily as needed for pain. 06/14/13  Yes Malissa Hippo, MD  Multiple Vitamin (MULTIVITAMIN WITH MINERALS) TABS Take 1 tablet by mouth daily.   Yes Historical Provider, MD  nystatin-triamcinolone (MYCOLOG II) cream Apply 1 application topically 3 (three) times daily as needed (irritation).   Yes Historical Provider, MD  ondansetron (ZOFRAN) 4 MG tablet TAKE 1 TABLET (4 MG TOTAL) BY MOUTH EVERY 12 (TWELVE) HOURS AS NEEDED FOR NAUSEA. 07/24/13  Yes Malissa Hippo, MD  pantoprazole (PROTONIX) 40 MG tablet Take 40 mg by mouth 2 (two) times daily as needed (acid reflex).   Yes Historical Provider, MD  potassium chloride SA (K-DUR,KLOR-CON) 20 MEQ tablet Take 1 tablet (20 mEq total) by mouth 2 (two) times daily. 02/27/13  Yes Nishant  Dhungel, MD  predniSONE (DELTASONE) 10 MG tablet TAKE 0.5 TABLETS (5 MG TOTAL) BY MOUTH DAILY. 08/20/13  Yes Malissa Hippo, MD  spironolactone (ALDACTONE) 50 MG tablet Take 50 mg by mouth daily.  01/10/13  Yes Historical Provider, MD  Cyanocobalamin (VITAMIN B-12 IJ) Inject 1 each as directed every 30 (thirty) days. Around the 1st week of each month    Historical Provider, MD   Physical Exam: Filed Vitals:   08/22/13 0816 08/22/13 1130 08/22/13 1237  BP: 101/68 100/61 104/63  Pulse: 92 88 88  Temp: 98.1 F (36.7 C)    TempSrc: Oral    Resp: 16 12 19   SpO2: 100% 99% 98%     General:   cachectic female, no acute distress   Eyes:  PERRL, anicteric, non-injected.  ENT:  Nares clear.  OP clear, non-erythematous without plaques or exudates.  MMM.  Neck:  Supple without TM or JVD.    Lymph:  No cervical, supraclavicular, or submandibular LAD.  Cardiovascular:  RRR, normal S1, S2, without m/r/g.  2+ pulses, warm extremities  Respiratory:  CTA bilaterally without increased WOB.  Abdomen:   hypoactive bowel sounds .  Soft, ND mild tenderness to palpation in the epigastric area without rebound or guarding.    Skin:  No rashes or focal lesions.  Musculoskeletal:  Normal bulk and tone.   Trace LE edema.  Psychiatric:  A & O x 4.  Appropriate affect.  Neurologic:  CN 3-12 intact.  5/5 strength.  Sensation intact.  Labs on Admission:  Basic Metabolic Panel:  Recent Labs Lab 08/22/13 0930  NA 136  K 2.6*  CL 107  CO2 12*  GLUCOSE 80  BUN 19  CREATININE 1.50*  CALCIUM 8.2*   Liver Function Tests:  Recent Labs Lab 08/22/13 0930  AST 15  ALT 13  ALKPHOS 151*  BILITOT 0.4  PROT 8.6*  ALBUMIN 3.6    Recent Labs Lab 08/22/13 0930  LIPASE 14   No results found for this basename: AMMONIA,  in the last 168 hours CBC:  Recent Labs Lab 08/22/13 0930  WBC 10.0  NEUTROABS 6.5  HGB 12.0  HCT 35.1*  MCV 84.4  PLT 357   Cardiac Enzymes: No results found for  this basename: CKTOTAL, CKMB, CKMBINDEX, TROPONINI,  in the last 168 hours  BNP (last 3 results)  Recent Labs  02/24/13 1625 04/28/13 2305  PROBNP 511.1* 215.7*   CBG: No results found for this basename: GLUCAP,  in the last 168 hours  Radiological Exams on Admission: No results found.  EKG: Independently reviewed. Normal sinus rhythm, inverted T waves in the inferior leads, and V4 to V6.  These were to be new compared to an EKG from August.  The flattened T waves in V4 through V6, are similar to EKG from May of 2014.    Assessment/Plan Principal Problem:   Diarrhea Active Problems:   Hypokalemia   Crohn's disease   GERD (gastroesophageal reflux disease)   Abdominal pain   Nausea vomiting and diarrhea   Acute renal failure   Dehydration   Metabolic acidosis  ---  Nausea, vomiting, diarrhea, and abdominal pain. Differential diagnosis includes infectious diarrhea including C. difficile diarrhea. The patient was on antibiotics a month ago for urinary tract infection. Differential also includes diverticulitis, Crohn's flare, partial small bowel obstruction.  Less likely would be intra-abdominal abscess given no rebound or guarding, and relatively well controlled Crohn's disease previously.  I spoke with her primary gastroenterologist, Dr. Bonney Roussel, who recommended empiric steroids and antibiotics, stool studies. She is not improving in one to 2 days, could consider a CT scan of the abdomen and pelvis.  He is planning to repeat colonoscopy soon as outpatient.   -  Admit to telemetry do to severe electrolyte disturbance -  Stool ova and parasite, culture, GI pathogen, C. Difficile -  Enteric precautions -  Ciprofloxacin and Flagyl -  Solumedrol 125 mg IV once, then start prednisone 40 mg daily tomorrow -  KUB pending -  Zofran prn -  Imodium prn  Polyarthritis, stable. Continue oral pain medications, with IV Dilaudid for breakthrough pain  GERD, stable.  Continue PPI  ECG  changes, likely secondary to electrolyte disturbances -  Telemetry -  Consider repeating once electrolytes normalized.  Hypokalemia, likely secondary to severe diarrhea -  Check magnesium level -  Potassium chloride 40 mEq by mouth  twice a day -  Additional potassium chloride IV 20 mEq -  Empiric magnesium 2 g IV once now  Mild elevated creatinine for baseline, likely due to dehydration -  IV fluids  Metabolic gap acidosis, likely due to combination of starvation ketosis, diarrhea -  Start sodium bicarbonate containing fluids  Diet:  Clear liquid diet Access:  PIV IVF:  Yes Proph:  Lovenox  Code Status: Full Family Communication: Spoke with patient and her sister Disposition Plan: Admit to telemetry  Time spent: 60 min Renae Fickle [facility] Pager [phone]  If 7PM-7AM, please contact night-coverage [email or web address] Password [facility] 08/22/2013, 12:43 PM

## 2013-08-22 NOTE — ED Notes (Signed)
Patients sister Vella Redhead is leaving. Phone #[phone]

## 2013-08-22 NOTE — ED Notes (Signed)
Verbal request from MD to perform EKG monitoring

## 2013-08-23 DIAGNOSIS — K219 Gastro-esophageal reflux disease without esophagitis: Secondary | ICD-10-CM

## 2013-08-23 DIAGNOSIS — R197 Diarrhea, unspecified: Secondary | ICD-10-CM

## 2013-08-23 DIAGNOSIS — K5090 Crohn's disease, unspecified, without complications: Secondary | ICD-10-CM

## 2013-08-23 DIAGNOSIS — E876 Hypokalemia: Secondary | ICD-10-CM

## 2013-08-23 LAB — GI PATHOGEN PANEL BY PCR, STOOL
Campylobacter by PCR: NEGATIVE
E coli (ETEC) LT/ST: NEGATIVE
E coli (STEC): NEGATIVE
Norovirus GI/GII: NEGATIVE
Rotavirus A by PCR: NEGATIVE
Shigella by PCR: NEGATIVE

## 2013-08-23 LAB — CBC
HCT: 27.5 % — ABNORMAL LOW (ref 36.0–46.0)
Hemoglobin: 9.3 g/dL — ABNORMAL LOW (ref 12.0–15.0)
WBC: 3.6 10*3/uL — ABNORMAL LOW (ref 4.0–10.5)

## 2013-08-23 LAB — BASIC METABOLIC PANEL
Chloride: 107 mEq/L (ref 96–112)
GFR calc Af Amer: 53 mL/min — ABNORMAL LOW (ref >90)
Potassium: 2.7 mEq/L — CL (ref 3.5–5.1)

## 2013-08-23 LAB — OVA AND PARASITE EXAMINATION
Ova and parasites: NONE SEEN
Special Requests: NORMAL

## 2013-08-23 MED ORDER — POTASSIUM CHLORIDE 20 MEQ/15ML (10%) PO LIQD
40.0000 meq | Freq: Every day | ORAL | Status: DC
  Filled 2013-08-23: qty 30

## 2013-08-23 MED ORDER — POTASSIUM CHLORIDE 20 MEQ/15ML (10%) PO LIQD
40.0000 meq | Freq: Once | ORAL | Status: DC
  Filled 2013-08-23 (×2): qty 30

## 2013-08-23 MED ORDER — HYDROCODONE-ACETAMINOPHEN 5-325 MG PO TABS: 1.0000 | ORAL_TABLET | Freq: Four times a day (QID) | ORAL | Status: DC | PRN

## 2013-08-23 MED ORDER — POTASSIUM CHLORIDE CRYS ER 20 MEQ PO TBCR
40.0000 meq | EXTENDED_RELEASE_TABLET | Freq: Once | ORAL | Status: AC
  Administered 2013-08-23: 07:00:00 40 meq via ORAL
  Filled 2013-08-23: qty 2

## 2013-08-23 MED ORDER — METRONIDAZOLE 500 MG PO TABS: 500.0000 mg | ORAL_TABLET | Freq: Three times a day (TID) | ORAL | Status: DC

## 2013-08-23 MED ORDER — CIPROFLOXACIN HCL 500 MG PO TABS: 500.0000 mg | ORAL_TABLET | Freq: Two times a day (BID) | ORAL | Status: DC

## 2013-08-23 MED ORDER — POTASSIUM CHLORIDE 10 MEQ/100ML IV SOLN
10.0000 meq | INTRAVENOUS | Status: AC
  Administered 2013-08-23 (×3): 10 meq via INTRAVENOUS
  Filled 2013-08-23 (×3): qty 100

## 2013-08-23 MED ORDER — PREDNISONE 20 MG PO TABS: 40.0000 mg | ORAL_TABLET | Freq: Every day | ORAL | Status: DC

## 2013-08-23 NOTE — Discharge Summary (Signed)
Physician Discharge Summary  [name]:[id number] DOB: 02-15-1946 DOA: 08/22/2013  PCP: [name], MD  Admit date: 08/22/2013 Discharge date: 08/23/2013  Time spent: > 35 minutes  Recommendations for Outpatient Follow-up:  1. Please be sure to follow up with your GI physician once discharged 2. Also be sure to check potassium levels on recheck 3. F/u on creatinine levels 4. F/u on H/H  Discharge Diagnoses:  Principal Problem:   Diarrhea Active Problems:   Hypokalemia   Crohn's disease   GERD (gastroesophageal reflux disease)   Abdominal pain   Nausea vomiting and diarrhea   Acute renal failure   Dehydration   Metabolic acidosis   Discharge Condition: full  Diet recommendation: low sodium heart healthy  Filed Weights   08/22/13 1405 08/22/13 2106  Weight: 63.504 kg (140 lb) 58.469 kg (128 lb 14.4 oz)    History of present illness:  From the original HPI The patient is a 67 y.o. year-old female with history of well-controlled Crohn's disease, GERD, polyarthritis, chronic anemia who presents with abdominal pain, nausea, vomiting, and diarrhea. The patient was last at their baseline health 4 days ago. At baseline she has 2-3 watery soft brown bowel movements daily. 3 days ago, she developed watery diarrhea, 6-7 bowel movements per day. Two days ago she developed crampy diffuse 10/10 abdominal pain without radiation. The pain comes and goes. She has continued to have frequent bowel movements and later developed nausea with nonbilious, nonbloody vomiting. She has not been able to tolerate solid foods, however she has been trying to drink plenty of fluids. She is continue to take her twice daily oral potassium. She presented to the emergency department today with persistent nausea, vomiting, diarrhea and abdominal pain.  In the emergency department, her hemoglobin was 12,, BUN 19, creatinine 1.5, calcium 8.2. white blood cell count 10, platelets 375, sodium 136, potassium  2.6, bicarbonate 12. Her lipase and liver function tests were within normal limits. She was given Dilaudid 0.5 mg IV once, Zofran, Protonix, potassium 10 mEq IV and 40 mEq by mouth, and a normal saline bolus. She is being admitted for evaluation of her diarrhea in the setting of crohn's disease.    Hospital Course:  Nausea, vomiting, diarrhea, and abdominal pain. Differential diagnosis includes infectious diarrhea including C. difficile diarrhea. The patient was on antibiotics a month ago for urinary tract infection. Differential also includes diverticulitis, Crohn's flare, partial small bowel obstruction. Less likely would be intra-abdominal abscess given no rebound or guarding, and relatively well controlled Crohn's disease previously. - Magnesium WNL, Potassium is low as such will be replaced IV prior to discharge and patient to continue her oral potassium regimen once home.  - C. Difficile negative - Ciprofloxacin and Flagyl on discharge as patient improved on this regimen. - Solumedrol 125 mg IV once initially, prednisone 40 mg daily for the next 4 days. - KUB only showed gaseous distention of the colon - Zofran prn   Polyarthritis, stable. Continue oral pain medications, with IV Dilaudid for breakthrough pain   GERD, stable. Continue PPI   Hypokalemia, likely secondary to severe diarrhea  - Check magnesium level  - Potassium chloride 40 mEq by mouth daily - Will make sure patient receives 3 rounds of potassium IV as well as her oral potassium replacement prior to discharge. - Magnesium within normal limits.  Mild elevated creatinine for baseline, likely due to dehydration  - IV fluids   Metabolic gap acidosis, likely due to combination of starvation ketosis,  diarrhea  - Patient feeling improved and bicarb improving currently - Patient requesting discharge and po intake improved. No nausea or emesis reported and as such suspect that with improved oral intake her bicarb will continue  to improve.   Procedures:  None  Consultations:  None  Discharge Exam: Filed Vitals:   08/23/13 0923  BP: 93/58  Pulse: 81  Temp: 97.9 F (36.6 C)  Resp: 18    General: Pt in NAD, Alert and awake Cardiovascular: RRR, no MRG Respiratory: CTA BL, no wheezes Abdomen: soft, ND  Discharge Instructions  Discharge Orders   Future Appointments Provider Department Dept Phone   09/04/2013 11:30 AM Malissa Hippo, MD [facility] [phone]   09/11/2013 3:45 PM Malissa Hippo, MD [facility] [phone]   09/18/2013 10:00 AM [facility] Infusion [facility] [id number]   Future Orders Complete By Expires   Call MD for:  persistant nausea and vomiting  As directed    Call MD for:  severe uncontrolled pain  As directed    Call MD for:  temperature >100.4  As directed    Diet - low sodium heart healthy  As directed    Discharge instructions  As directed    Comments:     Please be sure to follow up with your gastroenterologist in 1-2 weeks or sooner should any new concerns arise.   Increase activity slowly  As directed        Medication List         albuterol 108 (90 BASE) MCG/ACT inhaler  Commonly known as:  PROVENTIL HFA;VENTOLIN HFA  Inhale 2 puffs into the lungs every 6 (six) hours as needed for wheezing.     CALCIUM + D PO  Take 1 tablet by mouth daily.     ciprofloxacin 500 MG tablet  Commonly known as:  CIPRO  Take 1 tablet (500 mg total) by mouth 2 (two) times daily.     dicyclomine 10 MG/5ML syrup  Commonly known as:  BENTYL  Take 20 mg by mouth 3 (three) times daily as needed (for irritable bowel).     diphenoxylate-atropine 2.5-0.025 MG per tablet  Commonly known as:  LOMOTIL  Take 1-2 tablets by mouth 2 (two) times daily as needed for diarrhea or loose stools.     feeding supplement Liqd  Take 1 Container by mouth 2 (two) times daily between meals.      HYDROcodone-acetaminophen 5-325 MG per tablet  Commonly known as:  NORCO/VICODIN  Take 1 tablet by mouth every 6 (six) hours as needed.     metroNIDAZOLE 500 MG tablet  Commonly known as:  FLAGYL  Take 1 tablet (500 mg total) by mouth 3 (three) times daily.     multivitamin with minerals Tabs tablet  Take 1 tablet by mouth daily.     nystatin-triamcinolone cream  Commonly known as:  MYCOLOG II  Apply 1 application topically 3 (three) times daily as needed (irritation).     ondansetron 4 MG tablet  Commonly known as:  ZOFRAN  TAKE 1 TABLET (4 MG TOTAL) BY MOUTH EVERY 12 (TWELVE) HOURS AS NEEDED FOR NAUSEA.     pantoprazole 40 MG tablet  Commonly known as:  PROTONIX  Take 40 mg by mouth 2 (two) times daily as needed (acid reflex).     potassium chloride SA 20 MEQ tablet  Commonly known as:  K-DUR,KLOR-CON  Take 1 tablet (20 mEq total) by mouth  2 (two) times daily.     predniSONE 20 MG tablet  Commonly known as:  DELTASONE  Take 2 tablets (40 mg total) by mouth daily with breakfast. Take 40 mg daily for the next 4 days then switch back to your old dose of 5 mg po daily     spironolactone 50 MG tablet  Commonly known as:  ALDACTONE  Take 50 mg by mouth daily.     VITAMIN B-12 IJ  Inject 1 each as directed every 30 (thirty) days. Around the 1st week of each month       Allergies  Allergen Reactions  . Aspirin Other (See Comments)    Cramps her stomach  . Sulfa Antibiotics Nausea And Vomiting      The results of significant diagnostics from this hospitalization (including imaging, microbiology, ancillary and laboratory) are listed below for reference.    Significant Diagnostic Studies: Dg Abd 2 Views  08/22/2013   CLINICAL DATA:  Abdominal pain, diarrhea.  EXAM: ABDOMEN - 2 VIEW  COMPARISON:  CT 07/12/2013  FINDINGS: Large hiatal hernia. Moderate gaseous distention of the colon. No small bowel dilatation. No free air organomegaly. Prior cholecystectomy. Visualized lung  bases are clear.  IMPRESSION: Mild gaseous distention of the colon.  Large hiatal hernia.   Electronically Signed   By: Charlett Nose M.D.   On: 08/22/2013 14:56    Microbiology: Recent Results (from the past 240 hour(s))  CLOSTRIDIUM DIFFICILE BY PCR     Status: None   Collection Time    08/22/13  8:01 PM      Result Value Range Status   C difficile by pcr NEGATIVE  NEGATIVE Final     Labs: Basic Metabolic Panel:  Recent Labs Lab 08/22/13 0930 08/22/13 1509 08/23/13 0437 08/23/13 0500  NA 136 133* 135  --   K 2.6* 3.0* 2.7*  --   CL 107 107 107  --   CO2 12* 10* 14*  --   GLUCOSE 80 80 178*  --   BUN 19 18 17   --   CREATININE 1.50* 1.45* 1.22*  --   CALCIUM 8.2* 7.9* 7.6*  --   MG  --   --   --  1.9   Liver Function Tests:  Recent Labs Lab 08/22/13 0930  AST 15  ALT 13  ALKPHOS 151*  BILITOT 0.4  PROT 8.6*  ALBUMIN 3.6    Recent Labs Lab 08/22/13 0930  LIPASE 14   No results found for this basename: AMMONIA,  in the last 168 hours CBC:  Recent Labs Lab 08/22/13 0930 08/22/13 1509 08/23/13 0437  WBC 10.0 11.1* 3.6*  NEUTROABS 6.5  --   --   HGB 12.0 11.7* 9.3*  HCT 35.1* 34.5* 27.5*  MCV 84.4 84.1 83.6  PLT 357 356 279   Cardiac Enzymes: No results found for this basename: CKTOTAL, CKMB, CKMBINDEX, TROPONINI,  in the last 168 hours BNP: BNP (last 3 results)  Recent Labs  02/24/13 1625 04/28/13 2305  PROBNP 511.1* 215.7*   CBG: No results found for this basename: GLUCAP,  in the last 168 hours     Signed:  Penny Pia  [facility] 08/23/2013, 11:37 AM

## 2013-08-23 NOTE — ED Provider Notes (Signed)
Medical screening examination/treatment/procedure(s) were conducted as a shared visit with non-physician practitioner(s) and myself.  I personally evaluated the patient during the encounter  Pt with significant dehydration and hypoKalemia.  Will need admission for rehydration and potassium replacement.  Pt stabilized in the ED    Joya Gaskins, MD 08/23/13 1143

## 2013-08-23 NOTE — Progress Notes (Signed)
PT Cancellation Note  Patient Details Name: Danielle [name] MRN: [id number] DOB: 10/02/1946   Cancelled Treatment:    Reason Eval/Treat Not Completed: PT screened, no needs identified, will sign off -- Talked with pt who reports Independent with functional mobility at home, no environmental barriers to returning home and pt has been OOB in room with nursing.  Pt denies changes in ability to mobilize and does not feel PT needed.  Agree, d/w RN and will sign off with no need for PT or DME for home d/c anticipated.  Please reorder should status change acutely.  Thank you.   Narda Amber [facility] 08/23/2013, 9:08 AM

## 2013-08-23 NOTE — Progress Notes (Signed)
Patient discharge teaching given, including activity, diet, follow-up appoints, and medications. Patient verbalized understanding of all discharge instructions. IV access was d/c'd. Vitals are stable. Skin is intact except as charted in most recent assessments. Pt to be escorted out by NT, to be driven home by family.  Garrit Marrow, MBA, BS, RN 

## 2013-08-24 LAB — URINE CULTURE

## 2013-08-25 ENCOUNTER — Other Ambulatory Visit (INDEPENDENT_AMBULATORY_CARE_PROVIDER_SITE_OTHER): Payer: Self-pay | Admitting: Internal Medicine

## 2013-08-25 LAB — STOOL CULTURE: Special Requests: NORMAL

## 2013-08-25 LAB — URINE CULTURE

## 2013-08-29 ENCOUNTER — Other Ambulatory Visit (INDEPENDENT_AMBULATORY_CARE_PROVIDER_SITE_OTHER): Payer: Self-pay | Admitting: Internal Medicine

## 2013-08-29 DIAGNOSIS — M069 Rheumatoid arthritis, unspecified: Secondary | ICD-10-CM | POA: Diagnosis not present

## 2013-08-29 DIAGNOSIS — K5090 Crohn's disease, unspecified, without complications: Secondary | ICD-10-CM | POA: Diagnosis not present

## 2013-09-04 ENCOUNTER — Ambulatory Visit (INDEPENDENT_AMBULATORY_CARE_PROVIDER_SITE_OTHER): Payer: Medicare Other | Admitting: Internal Medicine

## 2013-09-04 ENCOUNTER — Encounter (INDEPENDENT_AMBULATORY_CARE_PROVIDER_SITE_OTHER): Payer: Self-pay | Admitting: Internal Medicine

## 2013-09-04 VITALS — BP 100/68 | HR 74 | Temp 98.3°F | Resp 16 | Ht 64.0 in | Wt 134.7 lb

## 2013-09-04 DIAGNOSIS — K219 Gastro-esophageal reflux disease without esophagitis: Secondary | ICD-10-CM | POA: Diagnosis not present

## 2013-09-04 DIAGNOSIS — M810 Age-related osteoporosis without current pathological fracture: Secondary | ICD-10-CM | POA: Diagnosis not present

## 2013-09-04 DIAGNOSIS — K5090 Crohn's disease, unspecified, without complications: Secondary | ICD-10-CM | POA: Diagnosis not present

## 2013-09-04 DIAGNOSIS — E876 Hypokalemia: Secondary | ICD-10-CM | POA: Diagnosis not present

## 2013-09-04 MED ORDER — PANTOPRAZOLE SODIUM 40 MG PO TBEC: 40.0000 mg | DELAYED_RELEASE_TABLET | Freq: Two times a day (BID) | ORAL | Status: DC

## 2013-09-04 MED ORDER — DICYCLOMINE HCL 10 MG/5ML PO SOLN: 20.0000 mg | Freq: Two times a day (BID) | ORAL | Status: DC

## 2013-09-04 MED ORDER — ALENDRONATE SODIUM 70 MG PO TABS: 70.0000 mg | ORAL_TABLET | ORAL | Status: DC

## 2013-09-04 NOTE — Progress Notes (Signed)
Presenting complaint;  Followup for Crohn's disease and GERD.  Subjective:  Patient is a 67 year old African female who presents for scheduled visit. She was last seen 10 weeks ago. She is accompanied by her sister Ventura Sellers. She is back to feeling reasonably well. She was briefly hospitalized at [facility] on 08/22/2013 for nausea vomiting and diarrhea and noted to have low potassium. She was discharged the following day. Review of her labs reveals her serum potassium to be 2.7 at the time of discharge. Serum magnesium was normal. She is taking oral potassium as prescribed. She is back on spironolactone. She has an average of 3 stools per day. Stools are soft and normal. She denies rectal bleeding or melena. She says her appetite is good. Her weight has dropped by 5 pounds since her last visit. She says her heartburn is well controlled with therapy. She generally takes no more than 2 doses of dicyclomine per day. She was evaluated by Dr. 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 He did not feel that she had rheumatoid arthritis. She was begun on colchicine she has a followup appointment with him next month. She states she will need new prescription for Fosamax.     Current Medications: Current Outpatient Prescriptions  Medication Sig Dispense Refill  . albuterol (PROVENTIL HFA;VENTOLIN HFA) 108 (90 BASE) MCG/ACT inhaler Inhale 2 puffs into the lungs every 6 (six) hours as needed for wheezing.  1 Inhaler  2  . alendronate (FOSAMAX) 70 MG tablet 70 mg every 7 (seven) days.       . Calcium Carbonate-Vitamin D (CALCIUM + D PO) Take 1 tablet by mouth daily.      [name] COLCRYS 0.6 MG tablet Take 0.6 mg by mouth 2 (two) times daily.       . Cyanocobalamin (VITAMIN B-12 IJ) Inject 1 each as directed every 30 (thirty) days. Around the 1st week of each month      . dicyclomine (BENTYL) 10 MG/5ML syrup Take 20 mg by mouth 4 (four) times  daily as needed (for irritable bowel).       . diphenoxylate-atropine (LOMOTIL) 2.5-0.025 MG per tablet TAKE 1 TABLET 3 TIMES A DAY  90 tablet  2  . HYDROcodone-acetaminophen (NORCO/VICODIN) 5-325 MG per tablet Take 1 tablet by mouth every 6 (six) hours as needed.  30 tablet  0  . Multiple Vitamin (MULTIVITAMIN WITH MINERALS) TABS Take 1 tablet by mouth daily.      [name] nystatin-triamcinolone (MYCOLOG II) cream Apply 1 application topically 3 (three) times daily as needed (irritation).      . ondansetron (ZOFRAN) 4 MG tablet TAKE 1 TABLET BY MOUTH EVERY 12 HOURS AS NEEDED FOR NAUSEA  30 tablet  1  . pantoprazole (PROTONIX) 40 MG tablet Take 40 mg by mouth 2 (two) times daily as needed (acid reflex).      . potassium chloride SA (K-DUR,KLOR-CON) 20 MEQ tablet Take 1 tablet (20 mEq total) by mouth 2 (two) times daily.  30 tablet  2  . spironolactone (ALDACTONE) 50 MG tablet Take 50 mg by mouth daily.        No current facility-administered medications for this visit.     Objective: Blood pressure 100/68, pulse 74, temperature 98.3 F (36.8 C), temperature source Oral, resp. rate 16, height 5\' 4"  (1.626 m), weight 134 lb 11.2 oz (61.1 kg).  patient is alert and in no acute distress.  Conjunctiva is  pink. Sclera is nonicteric Oropharyngeal mucosa is normal. No neck masses or thyromegaly noted. Cardiac exam with regular rhythm normal S1 and S2. No murmur or gallop noted. Lungs are clear to auscultation. Abdomen symmetrical soft and nontender without organomegaly or masses.  Days edema around ankles.  Labs/studies Results: Lab data from 08/23/2013 VBC 3.6 H&H 9.3 and 27.5 Platelet count 279K Serum sodium 135, potassium 2.7, chloride 107, CO2 14, glucose 178 and BUN 17. Creatinine 1.22    Assessment:  #1. Crohn's disease. Clinically she appears to be in remission. Next dose of Remicade due next month. #2. Hypokalemia. Serum potassium at the time of discharge on 08/23/2013 was 2.7. It needs  to be repeated. #3. GERD.she has known large hiatal hernia and needs to be on twice a day schedule.  #4. Osteoporosis. #5. Anemia. CBC will be repeated in few weeks.   Plan:  Patient will go to the lab for metabolic 7. If CO2 remains low will request nephrology consultation. New prescription given for Fosamax 70 mg by mouth every weekly. Office visit in 2 months.

## 2013-09-04 NOTE — Patient Instructions (Signed)
Physician will contact you with results of blood work. 

## 2013-09-08 DIAGNOSIS — E876 Hypokalemia: Secondary | ICD-10-CM | POA: Diagnosis not present

## 2013-09-08 LAB — BASIC METABOLIC PANEL
BUN: 27 mg/dL — ABNORMAL HIGH (ref 6–23)
Calcium: 9.4 mg/dL (ref 8.4–10.5)
Potassium: 3.8 mEq/L (ref 3.5–5.3)
Sodium: 136 mEq/L (ref 135–145)

## 2013-09-11 ENCOUNTER — Ambulatory Visit (INDEPENDENT_AMBULATORY_CARE_PROVIDER_SITE_OTHER): Payer: Medicare Other | Admitting: Internal Medicine

## 2013-09-11 ENCOUNTER — Other Ambulatory Visit (INDEPENDENT_AMBULATORY_CARE_PROVIDER_SITE_OTHER): Payer: Self-pay | Admitting: Internal Medicine

## 2013-09-11 MED ORDER — SODIUM BICARBONATE 650 MG PO TABS: 650.0000 mg | ORAL_TABLET | Freq: Two times a day (BID) | ORAL | Status: DC

## 2013-09-12 ENCOUNTER — Encounter (INDEPENDENT_AMBULATORY_CARE_PROVIDER_SITE_OTHER): Payer: Self-pay

## 2013-09-13 DIAGNOSIS — D510 Vitamin B12 deficiency anemia due to intrinsic factor deficiency: Secondary | ICD-10-CM | POA: Diagnosis not present

## 2013-09-14 ENCOUNTER — Telehealth (INDEPENDENT_AMBULATORY_CARE_PROVIDER_SITE_OTHER): Payer: Self-pay | Admitting: *Deleted

## 2013-09-14 DIAGNOSIS — E876 Hypokalemia: Secondary | ICD-10-CM

## 2013-09-14 DIAGNOSIS — K5090 Crohn's disease, unspecified, without complications: Secondary | ICD-10-CM

## 2013-09-14 NOTE — Telephone Encounter (Signed)
Per Dr.Rehman the patient will need to have labs drawn in 2 weeks. 

## 2013-09-18 ENCOUNTER — Encounter (HOSPITAL_COMMUNITY): Payer: Self-pay

## 2013-09-18 ENCOUNTER — Encounter (HOSPITAL_COMMUNITY)
Admission: RE | Admit: 2013-09-18 | Discharge: 2013-09-18 | Disposition: A | Discharge status: Home / Self Care | Payer: Medicare Other | Source: Ambulatory Visit | Attending: Internal Medicine | Admitting: Internal Medicine

## 2013-09-18 DIAGNOSIS — K5090 Crohn's disease, unspecified, without complications: Secondary | ICD-10-CM | POA: Diagnosis not present

## 2013-09-18 MED ORDER — ACETAMINOPHEN 325 MG PO TABS
ORAL_TABLET | ORAL | Status: AC
  Filled 2013-09-18: qty 2

## 2013-09-18 MED ORDER — SODIUM CHLORIDE 0.9 % IV SOLN
300.0000 mg | INTRAVENOUS | Status: DC
  Administered 2013-09-18: 300 mg via INTRAVENOUS
  Filled 2013-09-18: qty 30

## 2013-09-18 MED ORDER — SODIUM CHLORIDE 0.9 % IV SOLN
INTRAVENOUS | Status: DC
  Administered 2013-09-18: 500 mL via INTRAVENOUS

## 2013-09-18 MED ORDER — LORATADINE 10 MG PO TABS
10.0000 mg | ORAL_TABLET | Freq: Once | ORAL | Status: AC
  Administered 2013-09-18: 10 mg via ORAL

## 2013-09-18 MED ORDER — ACETAMINOPHEN 325 MG PO TABS
650.0000 mg | ORAL_TABLET | Freq: Once | ORAL | Status: AC
  Administered 2013-09-18: 650 mg via ORAL

## 2013-09-18 MED ORDER — LORATADINE 10 MG PO TABS
ORAL_TABLET | ORAL | Status: AC
  Filled 2013-09-18: qty 1

## 2013-09-21 DIAGNOSIS — K5090 Crohn's disease, unspecified, without complications: Secondary | ICD-10-CM | POA: Diagnosis not present

## 2013-09-21 DIAGNOSIS — E876 Hypokalemia: Secondary | ICD-10-CM | POA: Diagnosis not present

## 2013-09-22 LAB — BASIC METABOLIC PANEL
BUN: 20 mg/dL (ref 6–23)
Creat: 1.25 mg/dL — ABNORMAL HIGH (ref 0.50–1.10)
Potassium: 3.7 mEq/L (ref 3.5–5.3)

## 2013-09-26 DIAGNOSIS — M109 Gout, unspecified: Secondary | ICD-10-CM | POA: Diagnosis not present

## 2013-09-26 DIAGNOSIS — M25539 Pain in unspecified wrist: Secondary | ICD-10-CM | POA: Diagnosis not present

## 2013-09-26 DIAGNOSIS — D759 Disease of blood and blood-forming organs, unspecified: Secondary | ICD-10-CM | POA: Diagnosis not present

## 2013-09-26 DIAGNOSIS — D529 Folate deficiency anemia, unspecified: Secondary | ICD-10-CM | POA: Diagnosis not present

## 2013-10-05 ENCOUNTER — Other Ambulatory Visit (INDEPENDENT_AMBULATORY_CARE_PROVIDER_SITE_OTHER): Payer: Self-pay | Admitting: Internal Medicine

## 2013-10-09 DIAGNOSIS — D510 Vitamin B12 deficiency anemia due to intrinsic factor deficiency: Secondary | ICD-10-CM | POA: Diagnosis not present

## 2013-10-09 DIAGNOSIS — K5090 Crohn's disease, unspecified, without complications: Secondary | ICD-10-CM | POA: Diagnosis not present

## 2013-10-12 ENCOUNTER — Other Ambulatory Visit (INDEPENDENT_AMBULATORY_CARE_PROVIDER_SITE_OTHER): Payer: Self-pay | Admitting: Internal Medicine

## 2013-10-18 ENCOUNTER — Telehealth (INDEPENDENT_AMBULATORY_CARE_PROVIDER_SITE_OTHER): Payer: Self-pay | Admitting: *Deleted

## 2013-10-18 ENCOUNTER — Other Ambulatory Visit (INDEPENDENT_AMBULATORY_CARE_PROVIDER_SITE_OTHER): Payer: Self-pay | Admitting: Internal Medicine

## 2013-10-18 NOTE — Telephone Encounter (Signed)
Needs her Zofran ref[facility] and [facility] [place] at [phone]. She is out of town and will be in there until Monday, 10/23/13.

## 2013-10-18 NOTE — Telephone Encounter (Signed)
Dr.Rehman had e-scribed [facility] [place]. I called [facility] [place] [facility] [place], they are going to call [facility] and have that prescription transferred to them.

## 2013-10-31 ENCOUNTER — Other Ambulatory Visit (INDEPENDENT_AMBULATORY_CARE_PROVIDER_SITE_OTHER): Payer: Self-pay | Admitting: Internal Medicine

## 2013-11-03 ENCOUNTER — Other Ambulatory Visit (INDEPENDENT_AMBULATORY_CARE_PROVIDER_SITE_OTHER): Payer: Self-pay | Admitting: Internal Medicine

## 2013-11-06 ENCOUNTER — Ambulatory Visit (INDEPENDENT_AMBULATORY_CARE_PROVIDER_SITE_OTHER): Payer: Medicare Other | Admitting: Internal Medicine

## 2013-11-06 ENCOUNTER — Encounter (INDEPENDENT_AMBULATORY_CARE_PROVIDER_SITE_OTHER): Payer: Self-pay | Admitting: Internal Medicine

## 2013-11-06 VITALS — BP 102/67 | HR 70 | Temp 98.1°F | Resp 18 | Ht 64.0 in | Wt 141.1 lb

## 2013-11-06 DIAGNOSIS — R110 Nausea: Secondary | ICD-10-CM

## 2013-11-06 DIAGNOSIS — K219 Gastro-esophageal reflux disease without esophagitis: Secondary | ICD-10-CM

## 2013-11-06 DIAGNOSIS — D649 Anemia, unspecified: Secondary | ICD-10-CM

## 2013-11-06 DIAGNOSIS — K5090 Crohn's disease, unspecified, without complications: Secondary | ICD-10-CM

## 2013-11-06 DIAGNOSIS — Z8639 Personal history of other endocrine, nutritional and metabolic disease: Secondary | ICD-10-CM

## 2013-11-06 DIAGNOSIS — Z862 Personal history of diseases of the blood and blood-forming organs and certain disorders involving the immune mechanism: Secondary | ICD-10-CM

## 2013-11-06 MED ORDER — ONDANSETRON HCL 4 MG PO TABS: 4.0000 mg | ORAL_TABLET | Freq: Three times a day (TID) | ORAL | Status: DC | PRN

## 2013-11-06 MED ORDER — SIMETHICONE 180 MG PO CAPS: 1.0000 | ORAL_CAPSULE | Freq: Three times a day (TID) | ORAL | Status: DC | PRN

## 2013-11-06 NOTE — Patient Instructions (Signed)
Can take Phazyme one capsule by mouth up to 3 times a day on as-needed basis. Physician will contact you with results of blood work when completed.

## 2013-11-06 NOTE — Progress Notes (Signed)
Presenting complaint;  Followup for Crohn's disease and electrolyte abnormalities.  Subjective:  Patient is 67 year old African female who presents for scheduled visit accompanied by sister. She was last seen on 09/04/2013. She feels better. Her appetite is now normal. She has gained 7 pounds since her last visit. She has 3-4 bowel movements daily. Stools are usually semi-formed. She denies rectal bleeding melena or abdominal pain. She uses hydrocodone very occasionally. She did experience nausea one day after Thanksgiving she believes was secondary to her eating peanuts. The symptoms resolved spontaneously but she would like to get ondansetron prescription refilled. She also reports feeling warm and experienced nausea and vomiting following Remicade infusion in October, 2014. She reports improvement in joint pain since she has been [place]. She complains of intermittent cramps in both nouns after taking potassium pills. She states heartburns well controlled with therapy. She complains of excessive flatus and no control at times.  Current Medications: Current Outpatient Prescriptions  Medication Sig Dispense Refill  . albuterol (PROVENTIL HFA;VENTOLIN HFA) 108 (90 BASE) MCG/ACT inhaler Inhale 2 puffs into the lungs every 6 (six) hours as needed for wheezing.  1 Inhaler  2  . alendronate (FOSAMAX) 70 MG tablet Take 1 tablet (70 mg total) by mouth every 7 (seven) days.  4 tablet  11  . Calcium Carbonate-Vitamin D (CALCIUM + D PO) Take 1 tablet by mouth daily.      [name] COLCRYS 0.6 MG tablet Take 0.6 mg by mouth 2 (two) times daily.       . Cyanocobalamin (VITAMIN B-12 IJ) Inject 1 each as directed every 30 (thirty) days. Around the 1st week of each month      . dicyclomine (BENTYL) 10 MG/5ML syrup TAKE 2 TEASPOONSFUL TWICE A DAY BEFORE A MEAL  240 mL  1  . diphenoxylate-atropine (LOMOTIL) 2.5-0.025 MG per tablet TAKE 1 TABLET 3 TIMES A DAY  90 tablet  2  . HYDROcodone-acetaminophen (NORCO/VICODIN)  5-325 MG per tablet Take 1 tablet by mouth every 6 (six) hours as needed.  30 tablet  0  . Multiple Vitamin (MULTIVITAMIN WITH MINERALS) TABS Take 1 tablet by mouth daily.      [name] nystatin-triamcinolone (MYCOLOG II) cream APPLY TOPICALLY 2 (TWO) TIMES DAILY AS NEEDED.  60 g  0  . ondansetron (ZOFRAN) 4 MG tablet TAKE 1 TABLET BY MOUTH EVERY 12 HOURS AS NEEDED FOR NAUSEA  30 tablet  0  . pantoprazole (PROTONIX) 40 MG tablet Take 1 tablet (40 mg total) by mouth 2 (two) times daily before a meal.  60 tablet  5  . potassium chloride SA (K-DUR,KLOR-CON) 20 MEQ tablet Take 1 tablet (20 mEq total) by mouth 2 (two) times daily.  30 tablet  2  . sodium bicarbonate 650 MG tablet Take 1 tablet (650 mg total) by mouth 2 (two) times daily.  60 tablet  5  . spironolactone (ALDACTONE) 50 MG tablet Take 50 mg by mouth daily.       [name] zolpidem (AMBIEN) 10 MG tablet Take 10 mg by mouth at bedtime as needed for sleep (Patient states that she takes on an as needed basis only.).        No current facility-administered medications for this visit.    Objective: Blood pressure 102/67, pulse 70, temperature 98.1 F (36.7 C), temperature source Oral, resp. rate 18, height 5\' 4"  (1.626 m), weight 141 lb 1.6 oz (64.003 kg). Patient is alert and in no acute distress. Conjunctiva is pink. Sclera is nonicteric Oropharyngeal mucosa  is normal. No neck masses or thyromegaly noted. Cardiac exam with regular rhythm S1 is split and S2. No murmur or gallop noted. Lungs are clear to auscultation. Abdomen. Bowel sounds are normal. Abdomen is soft and nontender without organomegaly or masses. No LE edema or clubbing noted.  Labs/studies Results: CBC from 08/23/2013 WBC 3.6, H&H 9.3 and 27.5 and platelet count 279K Metabolic 7 from 09/21/2013. Serum sodium 133, potassium 3.7, chloride 106, CO2 17, BUN 20, creatinine 1.25, glucose 106 and calcium 9.1.    Assessment:  #1. Ileal and anoectal Crohn's disease. She is on a  maintenance with Remicade infliximab. I do not believe the symptoms she experienced following her recent dose were secondary to medication. She will be monitored closely during upcoming infusion on 11/13/2013. #2. GERD. She has large hiatal hernia. She's doing well with therapy. #3. Anemia. No evidence of overt GI bleed. She's due for CBC. #4. Elevated serum creatinine. Based on GFR she appears to have stage III disease.Urinalysis in September 2014 revealed 30 mg/dL of protein. Will discuss with Dr. Loleta Chance regarding nephrology consultation.   Plan:  New prescription given for ondansetron 4 mg by mouth 3 times a day 30 with one refill. She will go to lab for CBC and metabolic 7. Office visit in 3 months.

## 2013-11-09 ENCOUNTER — Telehealth (INDEPENDENT_AMBULATORY_CARE_PROVIDER_SITE_OTHER): Payer: Self-pay | Admitting: *Deleted

## 2013-11-09 NOTE — Telephone Encounter (Signed)
Labs printed and mailed.

## 2013-11-09 NOTE — Telephone Encounter (Signed)
This is correct, the labs that were created on 11/06/13. Thank you,Donna.

## 2013-11-09 NOTE — Telephone Encounter (Signed)
Danielle [name] has lost her lab order sheet. Would like a new one mailed to her please. If any question, please call [phone]. Address has been addressed. [name], I printed the labs dated for further on 11/06/13 to mail. Please let me know if it is anything different.

## 2013-11-13 ENCOUNTER — Encounter (HOSPITAL_COMMUNITY)
Admission: RE | Admit: 2013-11-13 | Discharge: 2013-11-13 | Disposition: A | Discharge status: Home / Self Care | Payer: Medicare Other | Source: Ambulatory Visit | Attending: Internal Medicine | Admitting: Internal Medicine

## 2013-11-13 DIAGNOSIS — K5090 Crohn's disease, unspecified, without complications: Secondary | ICD-10-CM | POA: Insufficient documentation

## 2013-11-13 MED ORDER — ACETAMINOPHEN 325 MG PO TABS
650.0000 mg | ORAL_TABLET | Freq: Once | ORAL | Status: AC
  Administered 2013-11-13: 650 mg via ORAL

## 2013-11-13 MED ORDER — ACETAMINOPHEN 325 MG PO TABS
ORAL_TABLET | ORAL | Status: AC
  Filled 2013-11-13: qty 2

## 2013-11-13 MED ORDER — ACETAMINOPHEN 325 MG PO TABS
ORAL_TABLET | ORAL | Status: AC
  Filled 2013-11-13: qty 1

## 2013-11-13 MED ORDER — SODIUM CHLORIDE 0.9 % IV SOLN
Freq: Once | INTRAVENOUS | Status: AC
  Administered 2013-11-13: 11:00:00 via INTRAVENOUS

## 2013-11-13 MED ORDER — LORATADINE 10 MG PO TABS
ORAL_TABLET | ORAL | Status: AC
  Filled 2013-11-13: qty 1

## 2013-11-13 MED ORDER — LORATADINE 10 MG PO TABS
10.0000 mg | ORAL_TABLET | Freq: Every day | ORAL | Status: DC
  Administered 2013-11-13: 10 mg via ORAL

## 2013-11-13 MED ORDER — SODIUM CHLORIDE 0.9 % IV SOLN
300.0000 mg | Freq: Once | INTRAVENOUS | Status: AC
  Administered 2013-11-13: 300 mg via INTRAVENOUS
  Filled 2013-11-13: qty 30

## 2013-11-15 LAB — CBC
HCT: 34.3 % — ABNORMAL LOW (ref 36.0–46.0)
MCH: 28.8 pg (ref 26.0–34.0)
MCHC: 32.9 g/dL (ref 30.0–36.0)
MCV: 87.3 fL (ref 78.0–100.0)
Platelets: 460 10*3/uL — ABNORMAL HIGH (ref 150–400)
RBC: 3.93 MIL/uL (ref 3.87–5.11)
RDW: 13.7 % (ref 11.5–15.5)

## 2013-11-15 LAB — BASIC METABOLIC PANEL WITH GFR
Calcium: 9.5 mg/dL (ref 8.4–10.5)
Chloride: 108 mEq/L (ref 96–112)
Creat: 1.32 mg/dL — ABNORMAL HIGH (ref 0.50–1.10)
GFR, Est African American: 49 mL/min — ABNORMAL LOW
GFR, Est Non African American: 42 mL/min — ABNORMAL LOW
Glucose, Bld: 72 mg/dL (ref 70–99)

## 2013-11-20 ENCOUNTER — Telehealth (INDEPENDENT_AMBULATORY_CARE_PROVIDER_SITE_OTHER): Payer: Self-pay | Admitting: *Deleted

## 2013-11-20 NOTE — Telephone Encounter (Signed)
Just spoke with Dr. Karilyn Cota and forgot to ask him if she could go up to 3 times daily on the Bentyl? If so, please call her in a new Rx. The return phone number is [phone].

## 2013-11-21 ENCOUNTER — Other Ambulatory Visit (INDEPENDENT_AMBULATORY_CARE_PROVIDER_SITE_OTHER): Payer: Self-pay | Admitting: *Deleted

## 2013-11-21 NOTE — Telephone Encounter (Signed)
A refill request was sent to Dr.Rehman. 

## 2013-11-21 NOTE — Telephone Encounter (Signed)
Matina has called back ask [name] to please return her call. She is out of the Bentyl and needs this call in. Her return phone number is [phone].

## 2013-11-21 NOTE — Telephone Encounter (Signed)
Patient is requesting a refill on this.she also says that she may need to take this 3 times a day. Forgot to mention to Dr.Rehman when they talked the other day on the phone.

## 2013-11-22 ENCOUNTER — Encounter (INDEPENDENT_AMBULATORY_CARE_PROVIDER_SITE_OTHER): Payer: Self-pay | Admitting: *Deleted

## 2013-11-22 ENCOUNTER — Telehealth (INDEPENDENT_AMBULATORY_CARE_PROVIDER_SITE_OTHER): Payer: Self-pay | Admitting: *Deleted

## 2013-11-22 DIAGNOSIS — E876 Hypokalemia: Secondary | ICD-10-CM

## 2013-11-22 DIAGNOSIS — K5090 Crohn's disease, unspecified, without complications: Secondary | ICD-10-CM

## 2013-11-22 MED ORDER — DICYCLOMINE HCL 10 MG/5ML PO SOLN: ORAL | Status: DC

## 2013-11-22 NOTE — Telephone Encounter (Signed)
Danielle [name] has been advised the [facility] has [facility] [place] by Dr. Karilyn Cota. Voices understood.

## 2013-11-22 NOTE — Telephone Encounter (Signed)
Per Dr.Rehman the patient will need to have labs drawn in 4 weeks.   

## 2013-11-27 DIAGNOSIS — K5090 Crohn's disease, unspecified, without complications: Secondary | ICD-10-CM | POA: Diagnosis not present

## 2013-12-08 ENCOUNTER — Other Ambulatory Visit (INDEPENDENT_AMBULATORY_CARE_PROVIDER_SITE_OTHER): Payer: Self-pay | Admitting: Internal Medicine

## 2013-12-18 DIAGNOSIS — M719 Bursopathy, unspecified: Secondary | ICD-10-CM | POA: Diagnosis not present

## 2013-12-18 DIAGNOSIS — M6530 Trigger finger, unspecified finger: Secondary | ICD-10-CM | POA: Diagnosis not present

## 2013-12-18 DIAGNOSIS — M67919 Unspecified disorder of synovium and tendon, unspecified shoulder: Secondary | ICD-10-CM | POA: Diagnosis not present

## 2013-12-18 DIAGNOSIS — M109 Gout, unspecified: Secondary | ICD-10-CM | POA: Diagnosis not present

## 2013-12-20 IMAGING — CR DG CHEST 2V
2 series · 2 of 2 positions shown · non-contrast
Comparison: Chest radiograph performed 11/06/2011

CLINICAL DATA: Nausea and vomiting; right lateral chest pain.

CHEST - 2 VIEW

[w chest pa]
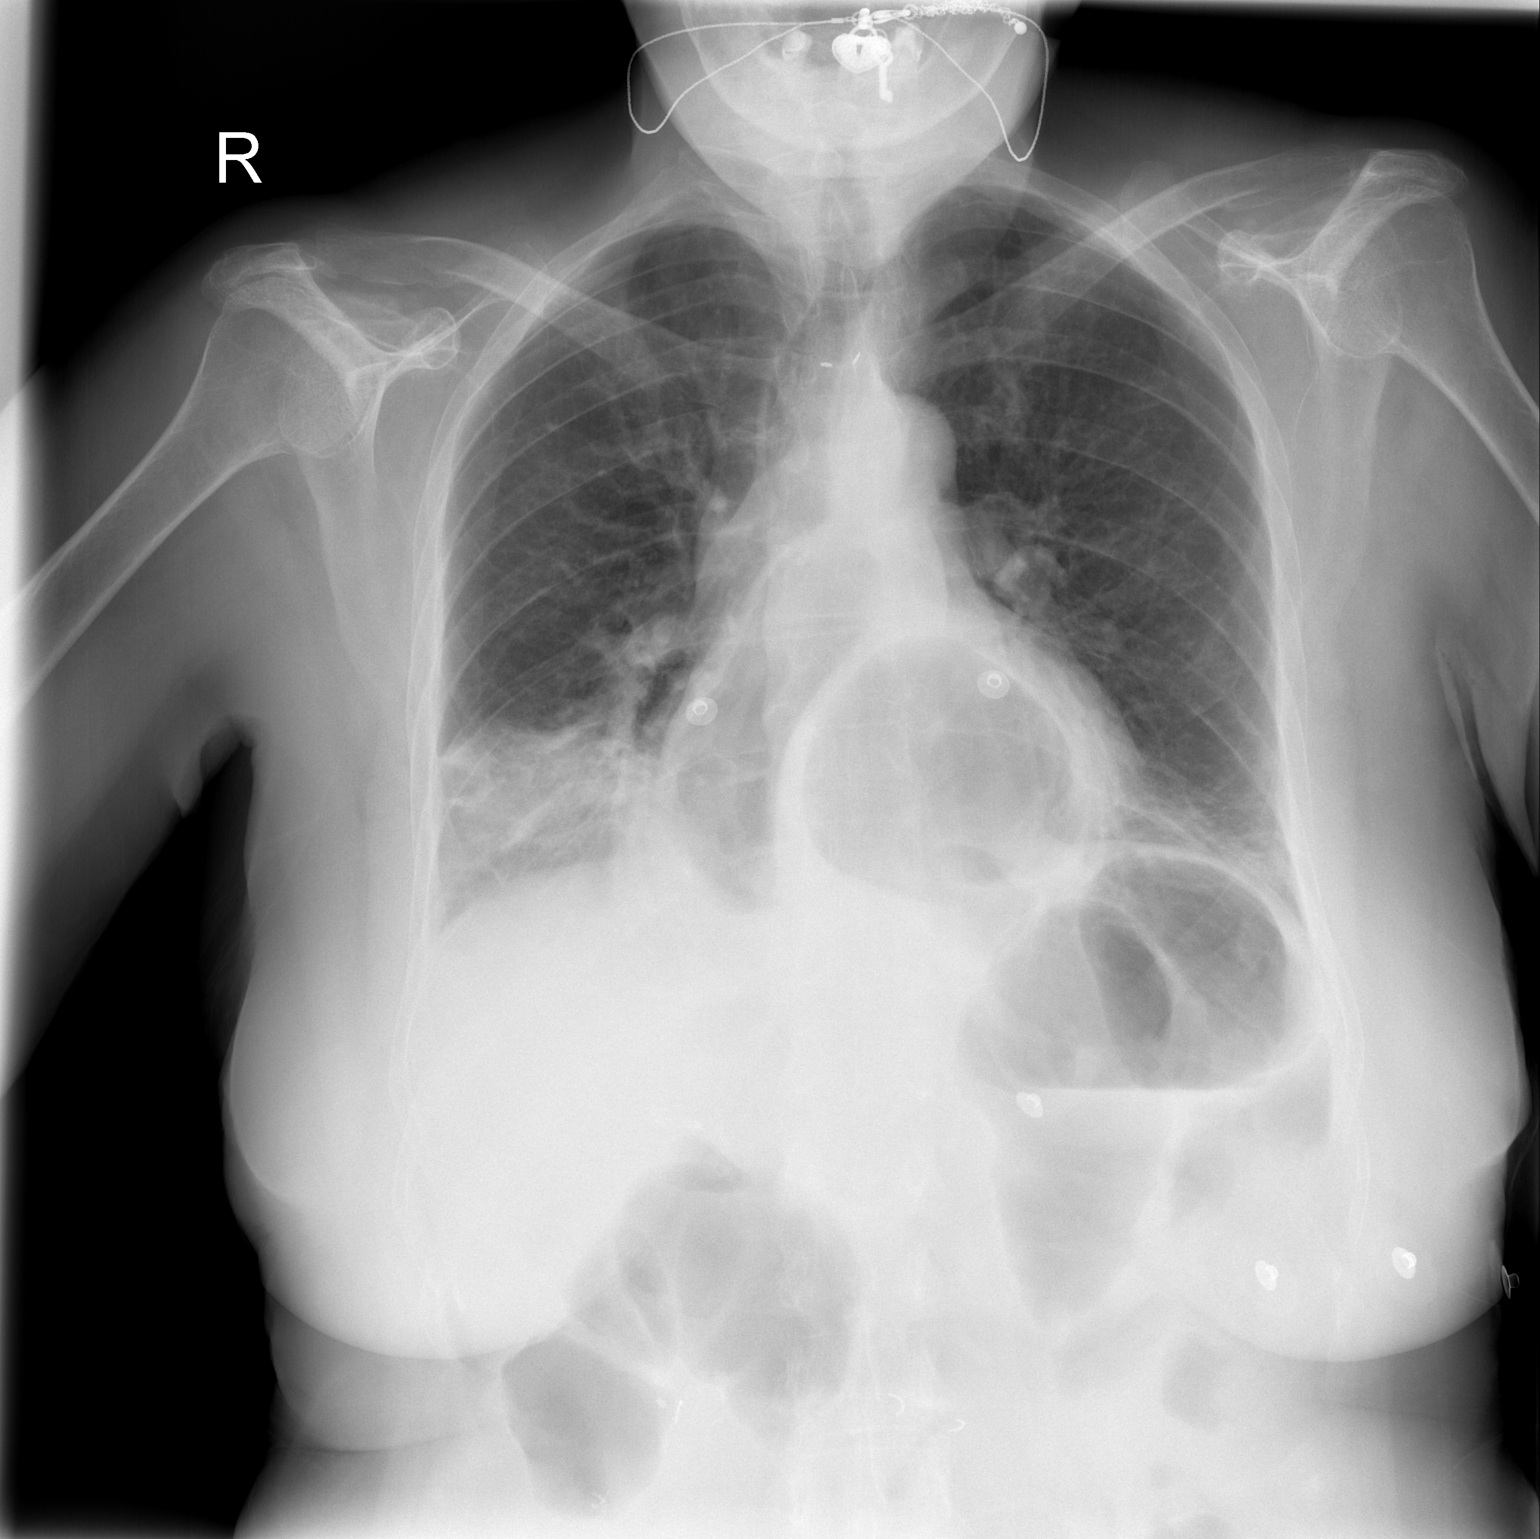

[w chest lat]
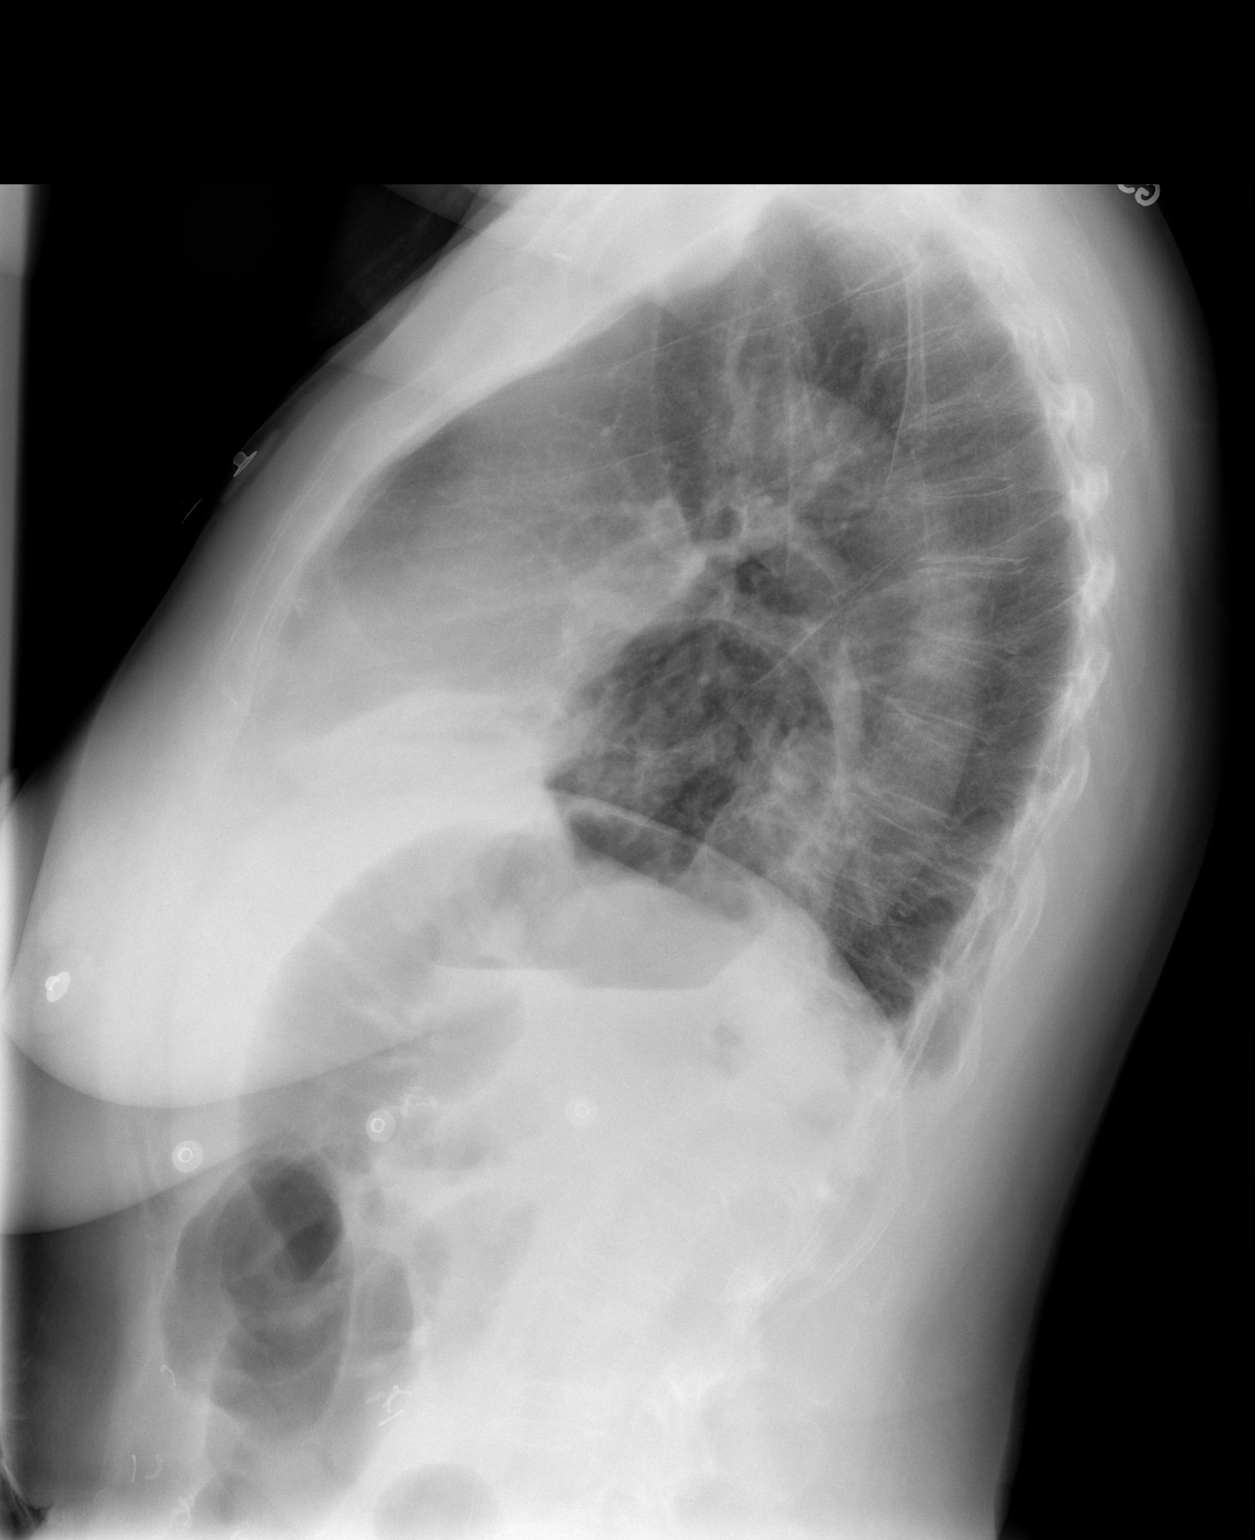

[2 of 2 positions shown; findings below may reference images not displayed]

FINDINGS: The lungs are well-aerated.  Bibasilar airspace
opacification is noted, worse on the right.  This raises concern
for pneumonia; aspiration cannot be entirely excluded.  No pleural
effusion or pneumothorax is seen.

The heart is borderline normal in size.  A somewhat complex large
hiatal hernia is again noted, filled with air.  No acute osseous
abnormalities are seen.
IMPRESSION: 1.  Bibasilar airspace opacification, worse on the right.  This
raises concern for pneumonia; aspiration cannot be entirely
excluded.
2.  Somewhat complex large hiatal hernia again noted, filled with
air.

## 2013-12-21 DIAGNOSIS — E876 Hypokalemia: Secondary | ICD-10-CM | POA: Diagnosis not present

## 2013-12-21 DIAGNOSIS — K5090 Crohn's disease, unspecified, without complications: Secondary | ICD-10-CM | POA: Diagnosis not present

## 2013-12-22 LAB — BASIC METABOLIC PANEL
BUN: 15 mg/dL (ref 6–23)
CO2: 13 mEq/L — ABNORMAL LOW (ref 19–32)
Calcium: 9 mg/dL (ref 8.4–10.5)
Chloride: 112 mEq/L (ref 96–112)
Creat: 1.58 mg/dL — ABNORMAL HIGH (ref 0.50–1.10)
Glucose, Bld: 86 mg/dL (ref 70–99)
Potassium: 3.7 mEq/L (ref 3.5–5.3)
Sodium: 135 mEq/L (ref 135–145)

## 2013-12-25 ENCOUNTER — Other Ambulatory Visit (INDEPENDENT_AMBULATORY_CARE_PROVIDER_SITE_OTHER): Payer: Self-pay | Admitting: Internal Medicine

## 2013-12-26 ENCOUNTER — Telehealth (INDEPENDENT_AMBULATORY_CARE_PROVIDER_SITE_OTHER): Payer: Self-pay | Admitting: *Deleted

## 2013-12-26 DIAGNOSIS — K5090 Crohn's disease, unspecified, without complications: Secondary | ICD-10-CM

## 2013-12-26 DIAGNOSIS — E876 Hypokalemia: Secondary | ICD-10-CM

## 2013-12-26 NOTE — Telephone Encounter (Signed)
Per Dr.Rehman the patient will need to have labs drawn in one month, if Nephrologist does them we will not. Notes Recorded by Rogene Houston, MD on 12/25/2013 at 10:33 AM Results a metabolic 7 reviewed with patient. CO2 is down again. Patient advised to go back on sodium bicarbonate 1 tablet twice daily. Serum creatinine is creeping up. Will arrange for followup with a nephrologist. She has seen one in the past in [place]. Metabolic 7 in one month unless one done at nephrologists office.

## 2013-12-26 NOTE — Telephone Encounter (Signed)
Referral & notes faxed to [facility], they will contact patient with appt, Ms Cake is aware

## 2013-12-27 ENCOUNTER — Other Ambulatory Visit (INDEPENDENT_AMBULATORY_CARE_PROVIDER_SITE_OTHER): Payer: Self-pay | Admitting: Internal Medicine

## 2013-12-28 ENCOUNTER — Encounter (INDEPENDENT_AMBULATORY_CARE_PROVIDER_SITE_OTHER): Payer: Self-pay | Admitting: *Deleted

## 2013-12-28 ENCOUNTER — Other Ambulatory Visit (INDEPENDENT_AMBULATORY_CARE_PROVIDER_SITE_OTHER): Payer: Self-pay | Admitting: *Deleted

## 2013-12-28 DIAGNOSIS — K5090 Crohn's disease, unspecified, without complications: Secondary | ICD-10-CM

## 2013-12-28 DIAGNOSIS — E876 Hypokalemia: Secondary | ICD-10-CM

## 2013-12-29 ENCOUNTER — Telehealth (INDEPENDENT_AMBULATORY_CARE_PROVIDER_SITE_OTHER): Payer: Self-pay | Admitting: *Deleted

## 2013-12-29 NOTE — Telephone Encounter (Signed)
She is at the drug store now getting an Rx filled

## 2013-12-29 NOTE — Telephone Encounter (Signed)
Ayumi needs something for a yeast infection. She has the cream Dr. Laural Golden gave her, that is not working, but is needing a medication. He gave her Cipro and Flagyl before. Her return phone number is [phone].

## 2014-01-04 DIAGNOSIS — R300 Dysuria: Secondary | ICD-10-CM | POA: Diagnosis not present

## 2014-01-08 ENCOUNTER — Encounter (HOSPITAL_COMMUNITY)
Admission: RE | Admit: 2014-01-08 | Discharge: 2014-01-08 | Disposition: A | Discharge status: Home / Self Care | Payer: Medicare Other | Source: Ambulatory Visit | Attending: Internal Medicine | Admitting: Internal Medicine

## 2014-01-08 DIAGNOSIS — K5090 Crohn's disease, unspecified, without complications: Secondary | ICD-10-CM | POA: Insufficient documentation

## 2014-01-08 IMAGING — CR DG ABDOMEN ACUTE W/ 1V CHEST
3 series · 3 of 3 positions shown · non-contrast
Comparison: 12/06/2012

CLINICAL DATA: Weakness and history of pneumonia.  History of
Crohn's disease.

ACUTE ABDOMEN SERIES (ABDOMEN 2 VIEW & CHEST 1 VIEW)

[w abdomen upright]
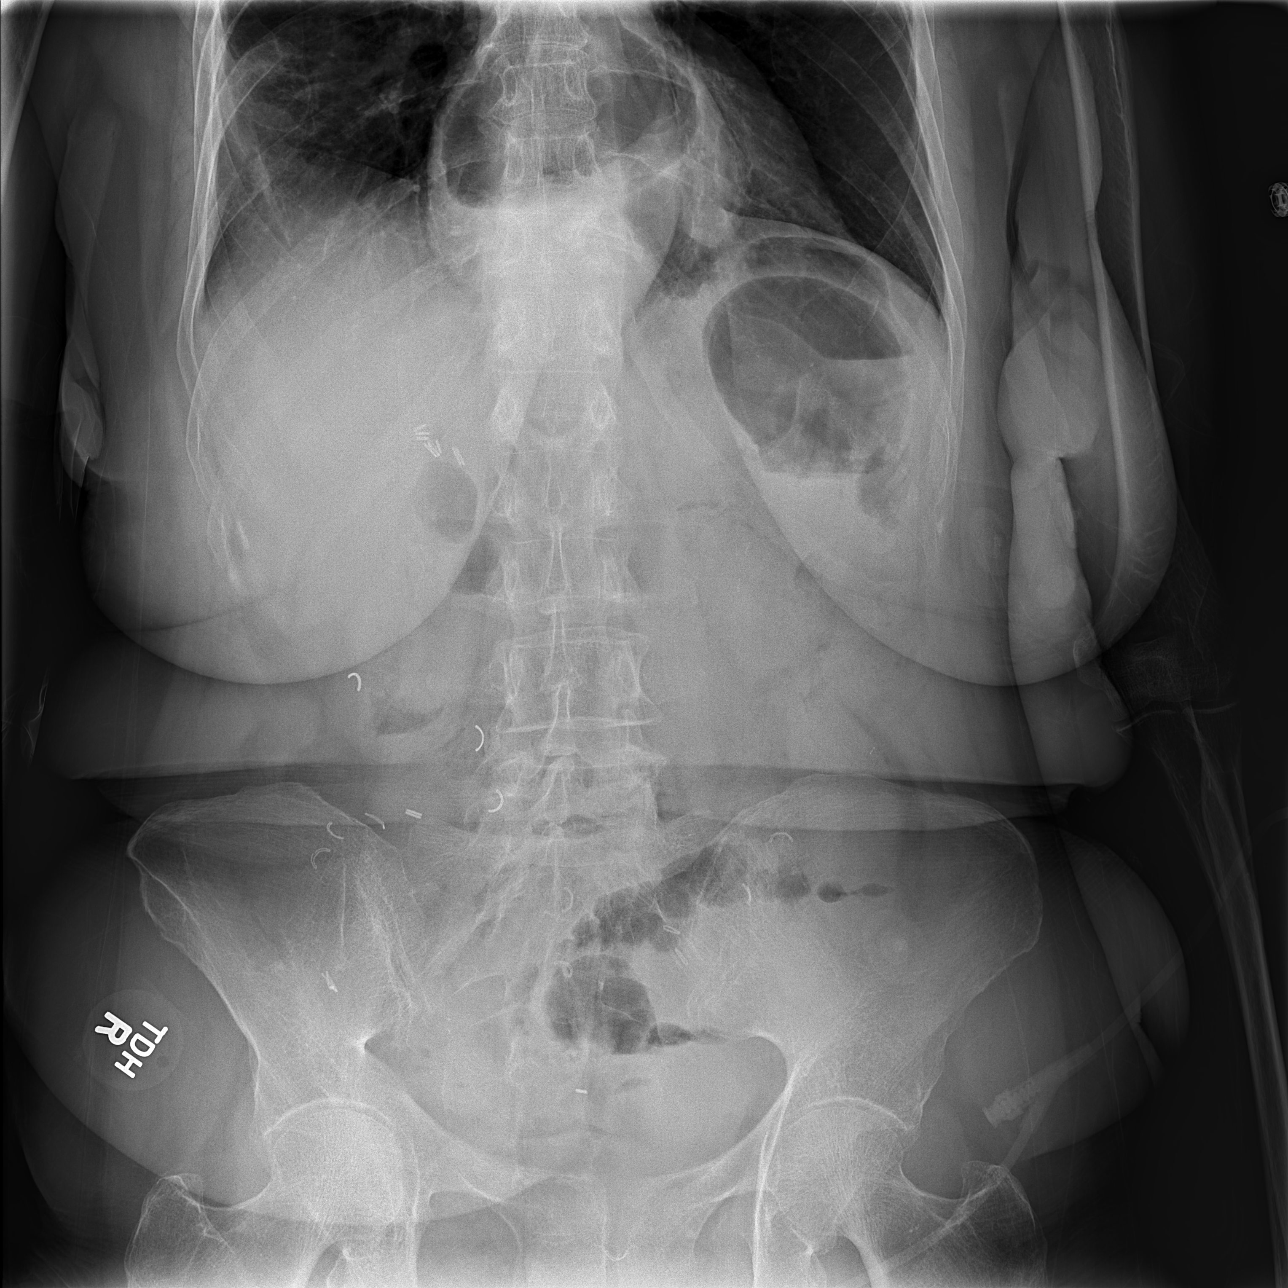

[w chest pa]
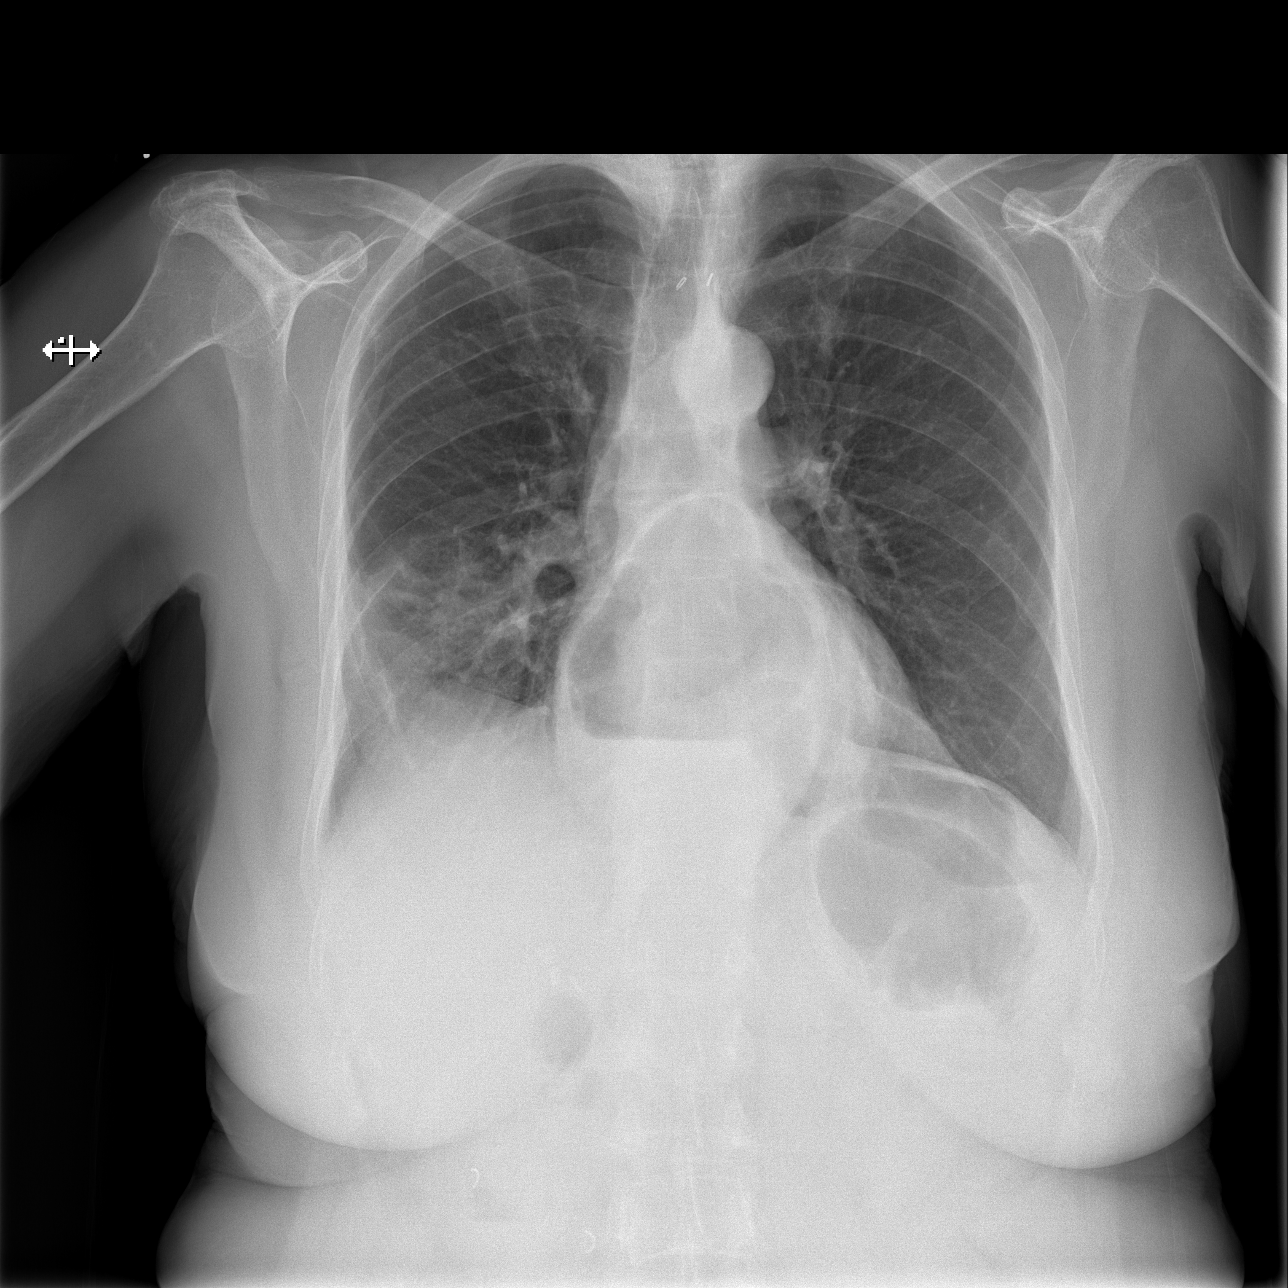

[t abdomen supine]
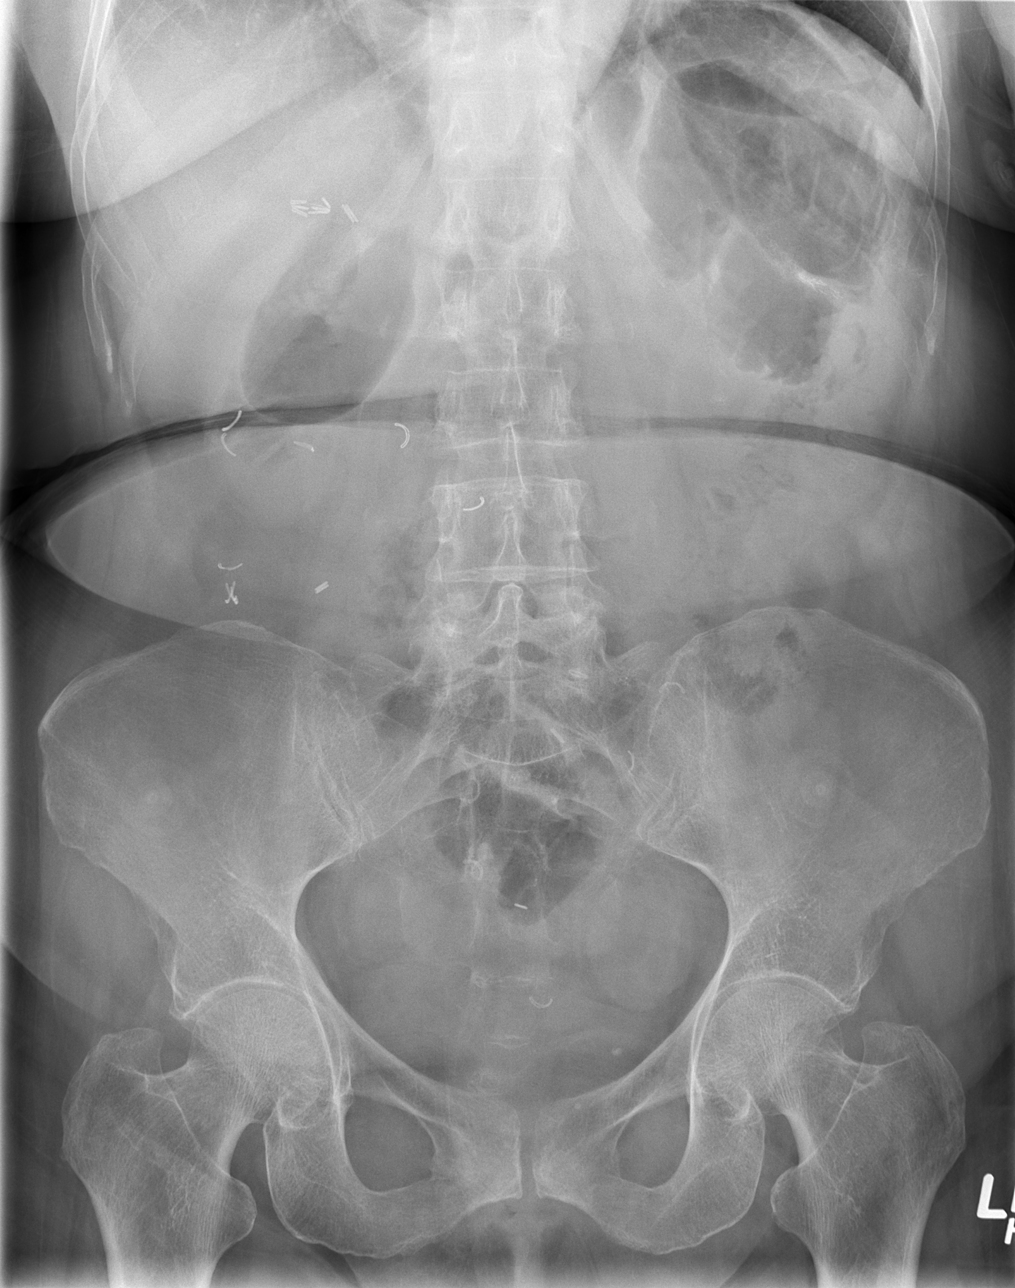

[3 of 3 positions shown; findings below may reference images not displayed]

FINDINGS: The chest radiograph demonstrates patchy densities at the
right lung base.  Again noted is a massive hiatal hernia.  The left
lung is clear.  No evidence for free air.  Surgical clips
throughout the abdomen.  There are air-fluid levels in the left
upper quadrant with dilated bowel loops.  No significant gas in the
rectum.
IMPRESSION: Recurrent or residual airspace disease at the right lung base.
Findings are concerning for pneumonia.  Recommend close follow-up
of this area to ensure resolution.  If this disease does not
resolve, recommend further characterization with CT.

Dilated bowel loops in the left upper abdomen.  Partial bowel
obstruction cannot be excluded based on these findings.

## 2014-01-09 DIAGNOSIS — M109 Gout, unspecified: Secondary | ICD-10-CM | POA: Diagnosis not present

## 2014-01-09 DIAGNOSIS — M67919 Unspecified disorder of synovium and tendon, unspecified shoulder: Secondary | ICD-10-CM | POA: Diagnosis not present

## 2014-01-09 DIAGNOSIS — M719 Bursopathy, unspecified: Secondary | ICD-10-CM | POA: Diagnosis not present

## 2014-01-09 DIAGNOSIS — R109 Unspecified abdominal pain: Secondary | ICD-10-CM | POA: Diagnosis not present

## 2014-01-10 ENCOUNTER — Encounter (HOSPITAL_COMMUNITY)
Admission: RE | Admit: 2014-01-10 | Discharge: 2014-01-10 | Disposition: A | Discharge status: Home / Self Care | Payer: Medicare Other | Source: Ambulatory Visit | Attending: Internal Medicine | Admitting: Internal Medicine

## 2014-01-10 DIAGNOSIS — K5090 Crohn's disease, unspecified, without complications: Secondary | ICD-10-CM | POA: Diagnosis not present

## 2014-01-10 MED ORDER — ACETAMINOPHEN 325 MG PO TABS
650.0000 mg | ORAL_TABLET | Freq: Once | ORAL | Status: AC
  Administered 2014-01-10: 650 mg via ORAL

## 2014-01-10 MED ORDER — LORATADINE 10 MG PO TABS
10.0000 mg | ORAL_TABLET | Freq: Every day | ORAL | Status: DC
  Administered 2014-01-10: 10 mg via ORAL

## 2014-01-10 MED ORDER — INFLIXIMAB 100 MG IV SOLR
300.0000 mg | Freq: Once | INTRAVENOUS | Status: AC
  Administered 2014-01-10: 300 mg via INTRAVENOUS
  Filled 2014-01-10: qty 30

## 2014-01-10 MED ORDER — SODIUM CHLORIDE 0.9 % IV SOLN
Freq: Once | INTRAVENOUS | Status: AC
  Administered 2014-01-10: 250 mL via INTRAVENOUS

## 2014-01-10 MED ORDER — LORATADINE 10 MG PO TABS
ORAL_TABLET | ORAL | Status: AC
  Filled 2014-01-10: qty 1

## 2014-01-10 MED ORDER — ACETAMINOPHEN 325 MG PO TABS
ORAL_TABLET | ORAL | Status: AC
  Filled 2014-01-10: qty 2

## 2014-01-10 NOTE — Progress Notes (Signed)
Awake. Voices no c/o at this time. D/C to home in good condition. 

## 2014-01-10 NOTE — Progress Notes (Signed)
Filter applied to IV tubing for remicade infusion.

## 2014-01-10 NOTE — Progress Notes (Signed)
Resting quietly. remicade infusing via pump without difficulty. Voices no c/o at this time.

## 2014-01-10 NOTE — Progress Notes (Signed)
remicade complete. Saline infusing without difficulty. Voices no c/o at this time.

## 2014-01-10 NOTE — Progress Notes (Signed)
remicade started and infusing without difficulty. Voices no c/o at this time.

## 2014-01-10 NOTE — Progress Notes (Signed)
Here for remicade infusion. Dx crohn's disease. Sister at side.

## 2014-01-11 ENCOUNTER — Other Ambulatory Visit (INDEPENDENT_AMBULATORY_CARE_PROVIDER_SITE_OTHER): Payer: Self-pay | Admitting: Internal Medicine

## 2014-01-22 ENCOUNTER — Telehealth (INDEPENDENT_AMBULATORY_CARE_PROVIDER_SITE_OTHER): Payer: Self-pay | Admitting: *Deleted

## 2014-01-22 NOTE — Telephone Encounter (Signed)
Da called today and still has not heard from "kidney doctor".  Can you check on this and see what is going on and call her.  She is aware that we may not be in Tuesday and it may be Wednesday or Thursday before she heard from you.

## 2014-01-24 NOTE — Telephone Encounter (Signed)
Patient made aware [facility] has reviewed her records, she is rated a 3 and it will be 6 weeks before they contact her with appointment

## 2014-01-27 ENCOUNTER — Other Ambulatory Visit (INDEPENDENT_AMBULATORY_CARE_PROVIDER_SITE_OTHER): Payer: Self-pay | Admitting: Internal Medicine

## 2014-01-28 IMAGING — CR DG CHEST 2V
2 series · 2 of 2 positions shown · non-contrast
Comparison: 12/29/2012

CLINICAL DATA: Nausea and weakness, shortness of breath

CHEST - 2 VIEW

[w chest pa]
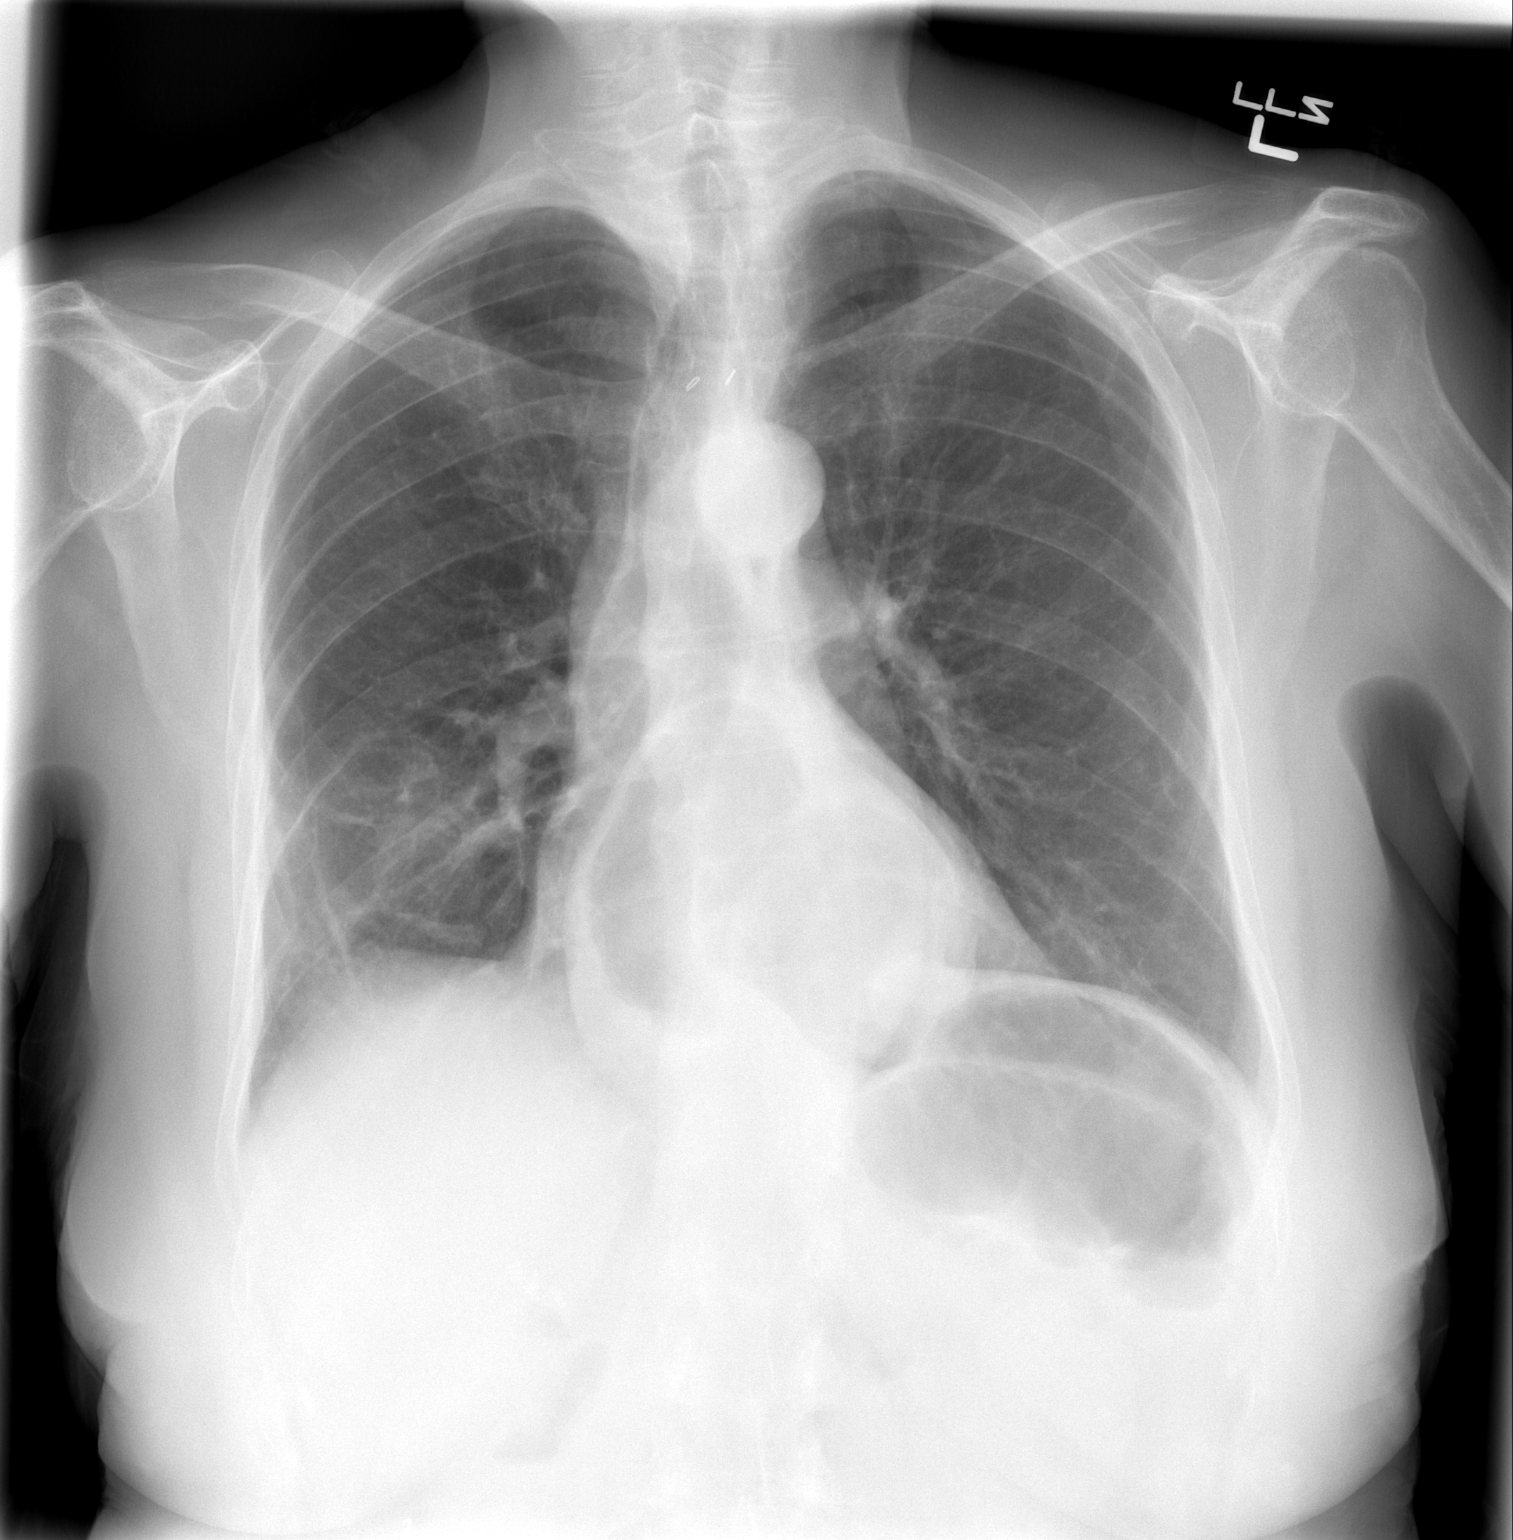

[w chest lat]
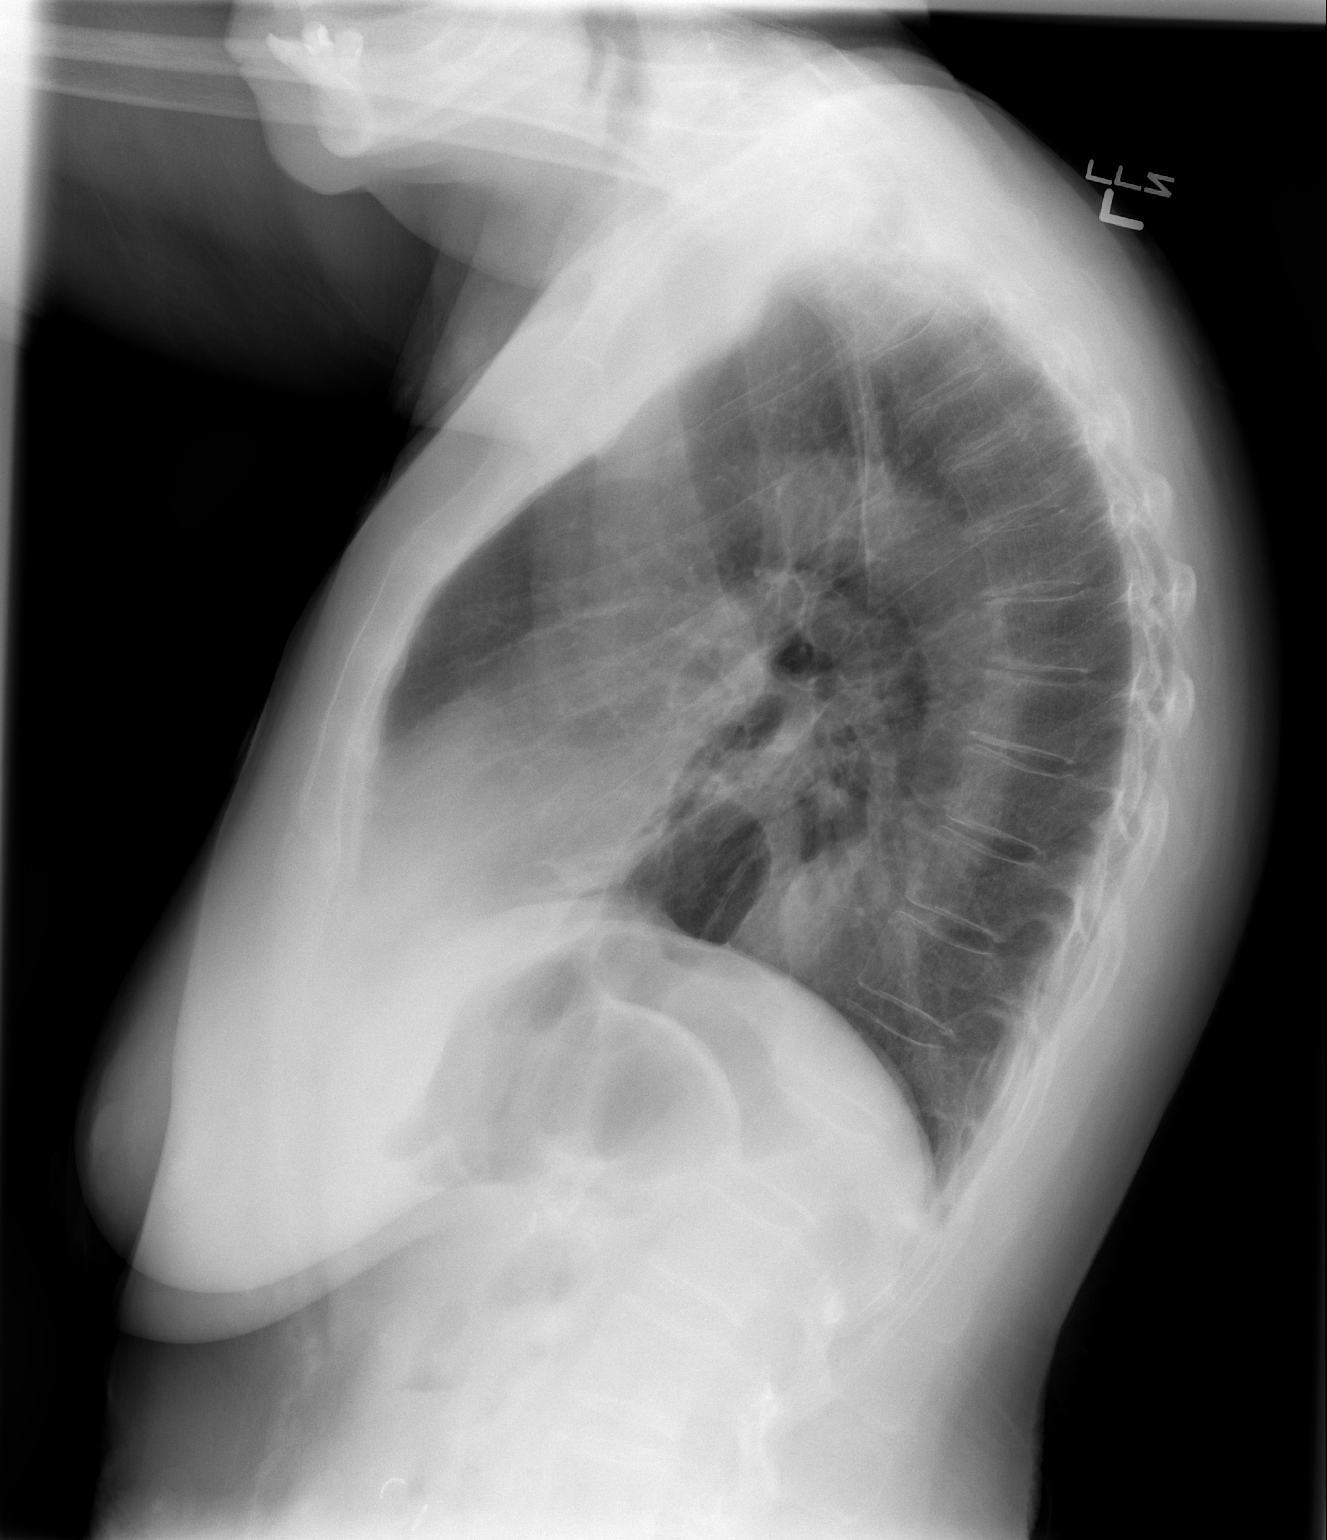

[2 of 2 positions shown; findings below may reference images not displayed]

FINDINGS: The large hiatal hernia is seen.  Cardiac shadow is
stable.  Improved aeration is noted in the right lung base with
some mild residual density.  No new focal abnormality is seen.
IMPRESSION: Improved aeration in the right lung base with some mild persistent
changes identified.

## 2014-01-29 ENCOUNTER — Other Ambulatory Visit (INDEPENDENT_AMBULATORY_CARE_PROVIDER_SITE_OTHER): Payer: Self-pay | Admitting: Internal Medicine

## 2014-02-05 ENCOUNTER — Ambulatory Visit (INDEPENDENT_AMBULATORY_CARE_PROVIDER_SITE_OTHER): Payer: Medicare Other | Admitting: Internal Medicine

## 2014-02-06 ENCOUNTER — Ambulatory Visit (INDEPENDENT_AMBULATORY_CARE_PROVIDER_SITE_OTHER): Payer: Medicare Other | Admitting: Internal Medicine

## 2014-02-06 ENCOUNTER — Encounter (INDEPENDENT_AMBULATORY_CARE_PROVIDER_SITE_OTHER): Payer: Self-pay | Admitting: Internal Medicine

## 2014-02-06 VITALS — BP 104/68 | HR 70 | Temp 98.4°F | Resp 18 | Ht 64.0 in | Wt 136.0 lb

## 2014-02-06 DIAGNOSIS — R110 Nausea: Secondary | ICD-10-CM | POA: Diagnosis not present

## 2014-02-06 DIAGNOSIS — R197 Diarrhea, unspecified: Secondary | ICD-10-CM

## 2014-02-06 DIAGNOSIS — J209 Acute bronchitis, unspecified: Secondary | ICD-10-CM | POA: Diagnosis not present

## 2014-02-06 DIAGNOSIS — K5090 Crohn's disease, unspecified, without complications: Secondary | ICD-10-CM | POA: Diagnosis not present

## 2014-02-06 DIAGNOSIS — E876 Hypokalemia: Secondary | ICD-10-CM

## 2014-02-06 MED ORDER — AZITHROMYCIN 250 MG PO TABS: ORAL_TABLET | ORAL | Status: DC

## 2014-02-06 MED ORDER — DIPHENOXYLATE-ATROPINE 2.5-0.025 MG PO TABS: 1.0000 | ORAL_TABLET | Freq: Three times a day (TID) | ORAL | Status: DC | PRN

## 2014-02-06 MED ORDER — ONDANSETRON HCL 4 MG PO TABS: 4.0000 mg | ORAL_TABLET | Freq: Three times a day (TID) | ORAL | Status: DC | PRN

## 2014-02-06 NOTE — Patient Instructions (Signed)
Physician will contact you with results of blood work. 

## 2014-02-06 NOTE — Progress Notes (Signed)
Presenting complaint;  Followup for Crohn's disease GERD and electrolyte abnormalities. Patient complains of productive cough.  Subjective:  Patient is 68 year old African American female who has ileal and perianal Crohn disease who presents for scheduled visit. She was last seen 3 months ago. She has not required hospitalization since September 2014. She remains with good appetite. She states heartburn is well controlled with therapy. She has intermittent nausea without vomiting. She generally has 3 bowel movements per day. Stool consistency varies from loose to formed. She denies rectal discharge melena or rectal bleeding. Last Remicade dose was on 01/23/2014. She has not had blood work as planned. Today she is complaining of productive cough. She says it started about a week ago it is getting worse. She coughs up.green sputum. She denies fever chills or shortness of breath. She requests new prescription for diphenoxylate and ondansetron.  Current Medications: Current Outpatient Prescriptions  Medication Sig Dispense Refill  . albuterol (PROVENTIL HFA;VENTOLIN HFA) 108 (90 BASE) MCG/ACT inhaler Inhale 2 puffs into the lungs every 6 (six) hours as needed for wheezing.  1 Inhaler  2  . alendronate (FOSAMAX) 70 MG tablet Take 1 tablet (70 mg total) by mouth every 7 (seven) days.  4 tablet  11  . Calcium Carbonate-Vitamin D (CALCIUM + D PO) Take 1 tablet by mouth daily.      [name] COLCRYS 0.6 MG tablet Take 0.6 mg by mouth 2 (two) times daily.       . Cyanocobalamin (VITAMIN B-12 IJ) Inject 1 each as directed every 30 (thirty) days. Around the 1st week of each month      . dicyclomine (BENTYL) 10 MG/5ML syrup TAKE 2 TEASPOONSFUL TWICE A DAY BEFORE A MEAL  240 mL  2  . HYDROcodone-acetaminophen (NORCO) 7.5-325 MG per tablet Take 1 tablet by mouth every 6 (six) hours as needed for moderate pain.      . Multiple Vitamin (MULTIVITAMIN WITH MINERALS) TABS Take 1 tablet by mouth daily.      [name]  nystatin-triamcinolone (MYCOLOG II) cream APPLY TOPICALLY 2 (TWO) TIMES DAILY AS NEEDED.  60 g  0  . ondansetron (ZOFRAN) 4 MG tablet TAKE 1 TABLET (4 MG TOTAL) BY MOUTH EVERY 8 (EIGHT) HOURS AS NEEDED FOR NAUSEA OR VOMITING.  30 tablet  1  . pantoprazole (PROTONIX) 40 MG tablet Take 1 tablet (40 mg total) by mouth 2 (two) times daily before a meal.  60 tablet  5  . potassium chloride SA (K-DUR,KLOR-CON) 20 MEQ tablet Take 1 tablet (20 mEq total) by mouth 2 (two) times daily.  30 tablet  2  . Simethicone (PHAZYME) 180 MG CAPS Take 1 capsule (180 mg total) by mouth 3 (three) times daily as needed.  30 capsule  0  . sodium bicarbonate 650 MG tablet Take 1 tablet (650 mg total) by mouth 2 (two) times daily.  60 tablet  5  . spironolactone (ALDACTONE) 50 MG tablet Take 50 mg by mouth daily.       . diphenoxylate-atropine (LOMOTIL) 2.5-0.025 MG per tablet TAKE 1 TABLET 3 TIMES A DAY  90 tablet  2   No current facility-administered medications for this visit.     Objective: Blood pressure 104/68, pulse 70, temperature 98.4 F (36.9 C), temperature source Oral, resp. rate 18, height 5\' 4"  (1.626 m), weight 136 lb (61.689 kg). Patient is alert and in no acute distress. Conjunctiva is pink. Sclera is nonicteric Oropharyngeal mucosa is normal. No neck masses or thyromegaly noted. Cardiac exam with  regular rhythm normal S1 and S2. No murmur or gallop noted. Lungs are clear to auscultation. Abdomen with low midline scar. Abdomen is soft and nontender without organomegaly or masses.  No LE edema or clubbing noted.  Labs/studies Results: CBC from 11/16/2023 WBC 8.9, H&H 11.3 and 34.3 and platelet count 460 K. Metabolic 7 from 83/66/2947. Serum sodium 135, potassium 3.7, chloride 112, CO2 13, BUN 15, creatinine 1.58 and glucose 86.   Assessment:  #1. Ileal and perianal Crohn's disease. She appears to be in remission. She is not having any problems with infliximab or Remicade. #2. GERD. She has  known at least moderate size sliding-type hernia. She is doing well with therapy. #3. Acute bronchitis. She is afebrile and she does not appear to be in respiratory distress. 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 Patient is on sodium bicarbonate. #5. History of hypokalemia and hypomagnesemia.  Plan:   Zithromax 500 mg by mouth today then 250 daily for 4 more days. Patient will allow for metabolic 7 and serum magnesium level. New prescription given for ondansetron and diphenoxylate. Nephrology consultation is pending. Office visit in 3 months.

## 2014-02-07 ENCOUNTER — Other Ambulatory Visit (INDEPENDENT_AMBULATORY_CARE_PROVIDER_SITE_OTHER): Payer: Self-pay | Admitting: Internal Medicine

## 2014-02-14 DIAGNOSIS — L259 Unspecified contact dermatitis, unspecified cause: Secondary | ICD-10-CM | POA: Diagnosis not present

## 2014-02-18 ENCOUNTER — Other Ambulatory Visit (INDEPENDENT_AMBULATORY_CARE_PROVIDER_SITE_OTHER): Payer: Self-pay | Admitting: Internal Medicine

## 2014-02-19 ENCOUNTER — Telehealth (INDEPENDENT_AMBULATORY_CARE_PROVIDER_SITE_OTHER): Payer: Self-pay | Admitting: *Deleted

## 2014-02-19 NOTE — Telephone Encounter (Signed)
Patient called the office and left a message that she needed to talk with me about a reaaction from an antibiotic given by Dr.Rehman. I called the patient , left a vioice message on her voicemail.

## 2014-02-20 DIAGNOSIS — D631 Anemia in chronic kidney disease: Secondary | ICD-10-CM | POA: Diagnosis not present

## 2014-02-20 DIAGNOSIS — N183 Chronic kidney disease, stage 3 unspecified: Secondary | ICD-10-CM | POA: Diagnosis not present

## 2014-02-20 DIAGNOSIS — R809 Proteinuria, unspecified: Secondary | ICD-10-CM | POA: Diagnosis not present

## 2014-02-20 DIAGNOSIS — N2581 Secondary hyperparathyroidism of renal origin: Secondary | ICD-10-CM | POA: Diagnosis not present

## 2014-02-20 DIAGNOSIS — E785 Hyperlipidemia, unspecified: Secondary | ICD-10-CM | POA: Diagnosis not present

## 2014-02-21 ENCOUNTER — Encounter (HOSPITAL_COMMUNITY): Payer: Medicare Other

## 2014-02-21 NOTE — Telephone Encounter (Signed)
Patient has not responded to the messages left regarding the rash,and possible allergic reaction.

## 2014-02-22 ENCOUNTER — Other Ambulatory Visit (INDEPENDENT_AMBULATORY_CARE_PROVIDER_SITE_OTHER): Payer: Self-pay | Admitting: Internal Medicine

## 2014-02-22 DIAGNOSIS — M67919 Unspecified disorder of synovium and tendon, unspecified shoulder: Secondary | ICD-10-CM | POA: Diagnosis not present

## 2014-02-22 DIAGNOSIS — Z79899 Other long term (current) drug therapy: Secondary | ICD-10-CM | POA: Diagnosis not present

## 2014-02-22 DIAGNOSIS — M109 Gout, unspecified: Secondary | ICD-10-CM | POA: Diagnosis not present

## 2014-02-26 ENCOUNTER — Other Ambulatory Visit (INDEPENDENT_AMBULATORY_CARE_PROVIDER_SITE_OTHER): Payer: Self-pay | Admitting: Internal Medicine

## 2014-02-26 ENCOUNTER — Telehealth (INDEPENDENT_AMBULATORY_CARE_PROVIDER_SITE_OTHER): Payer: Self-pay | Admitting: *Deleted

## 2014-02-26 MED ORDER — FLUCONAZOLE 100 MG PO TABS: 100.0000 mg | ORAL_TABLET | Freq: Every day | ORAL | Status: DC

## 2014-02-26 NOTE — Telephone Encounter (Signed)
Danielle [name] called and states that she saw Dr.Hill for the following: Taking Z-Pac. Mouth is swollen,blisters, and yeast on her tongue , throat and vagina. Dr.Hill gave her Prednisone 10 mg daily, she feels that this may need to be increase? A cream lotrimin..patient's insurance will no longer pay for the Mycolog cream. Patient is requesting medication for the yeast , both oral and vaginal. A refill on the cream And if she should increase the prednisone. Patient states that she has been unable to reach PCP , and when she did they advised that she call Dr.Rehman.

## 2014-02-26 NOTE — Telephone Encounter (Signed)
LM asking for someone to call her back. She is needing to see Dr. Laural Golden ASAP to explain what is going on with her and the medication. The return phone number is [phone].

## 2014-02-26 NOTE — Telephone Encounter (Signed)
Patient was called and made aware. 

## 2014-02-26 NOTE — Telephone Encounter (Signed)
Prescription for fluconazole sent to her pharmacy. Please advised patient to take it 3-5 days each time and no more

## 2014-02-27 ENCOUNTER — Telehealth (INDEPENDENT_AMBULATORY_CARE_PROVIDER_SITE_OTHER): Payer: Self-pay | Admitting: *Deleted

## 2014-02-27 NOTE — Telephone Encounter (Signed)
Danielle [name] said her pharmacy has not record of Albuterol called in. Her return phone number is [phone].

## 2014-02-28 NOTE — Telephone Encounter (Signed)
When I called the Pharamacy was told by the Pharmacist that Dr. Iona Beard had already called it in this morning for the patient. Patient was called and made aware

## 2014-02-28 NOTE — Telephone Encounter (Signed)
Per Dr.Rehman may call in the Clotrimazole -Beta Methsaome ,(Lotrosome) cream Use as directed # 1  No Refills This is called to the patient's pharmacy. Patient was made aware and also made aware that there was no Albuterol called in for her.

## 2014-03-07 ENCOUNTER — Inpatient Hospital Stay (HOSPITAL_COMMUNITY): Admission: RE | Admit: 2014-03-07 | Payer: Medicare Other | Source: Ambulatory Visit

## 2014-03-07 DIAGNOSIS — N760 Acute vaginitis: Secondary | ICD-10-CM | POA: Diagnosis not present

## 2014-03-10 IMAGING — CR DG CHEST 2V
2 series · 2 of 2 positions shown · non-contrast
Comparison: 01/14/2013

CLINICAL DATA: Shortness of breath

CHEST - 2 VIEW

[w chest pa]
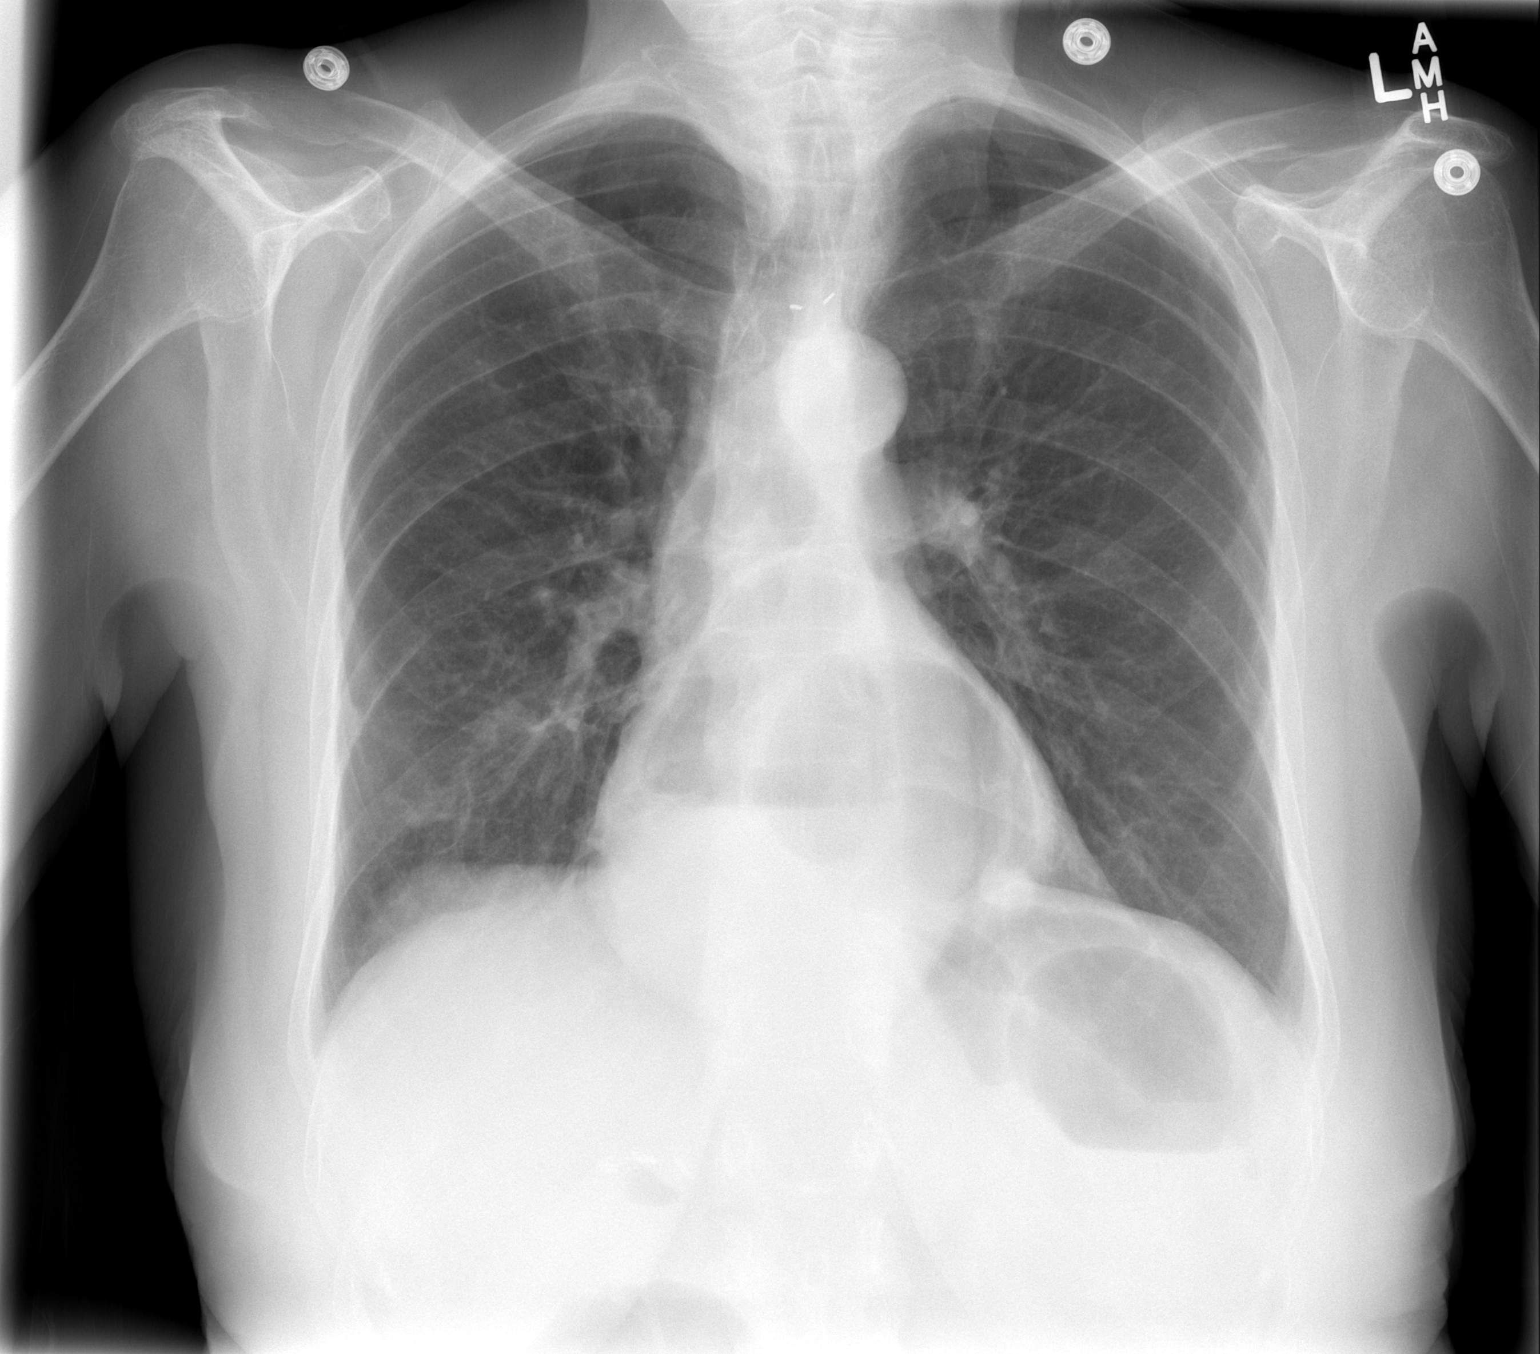

[w chest lat]
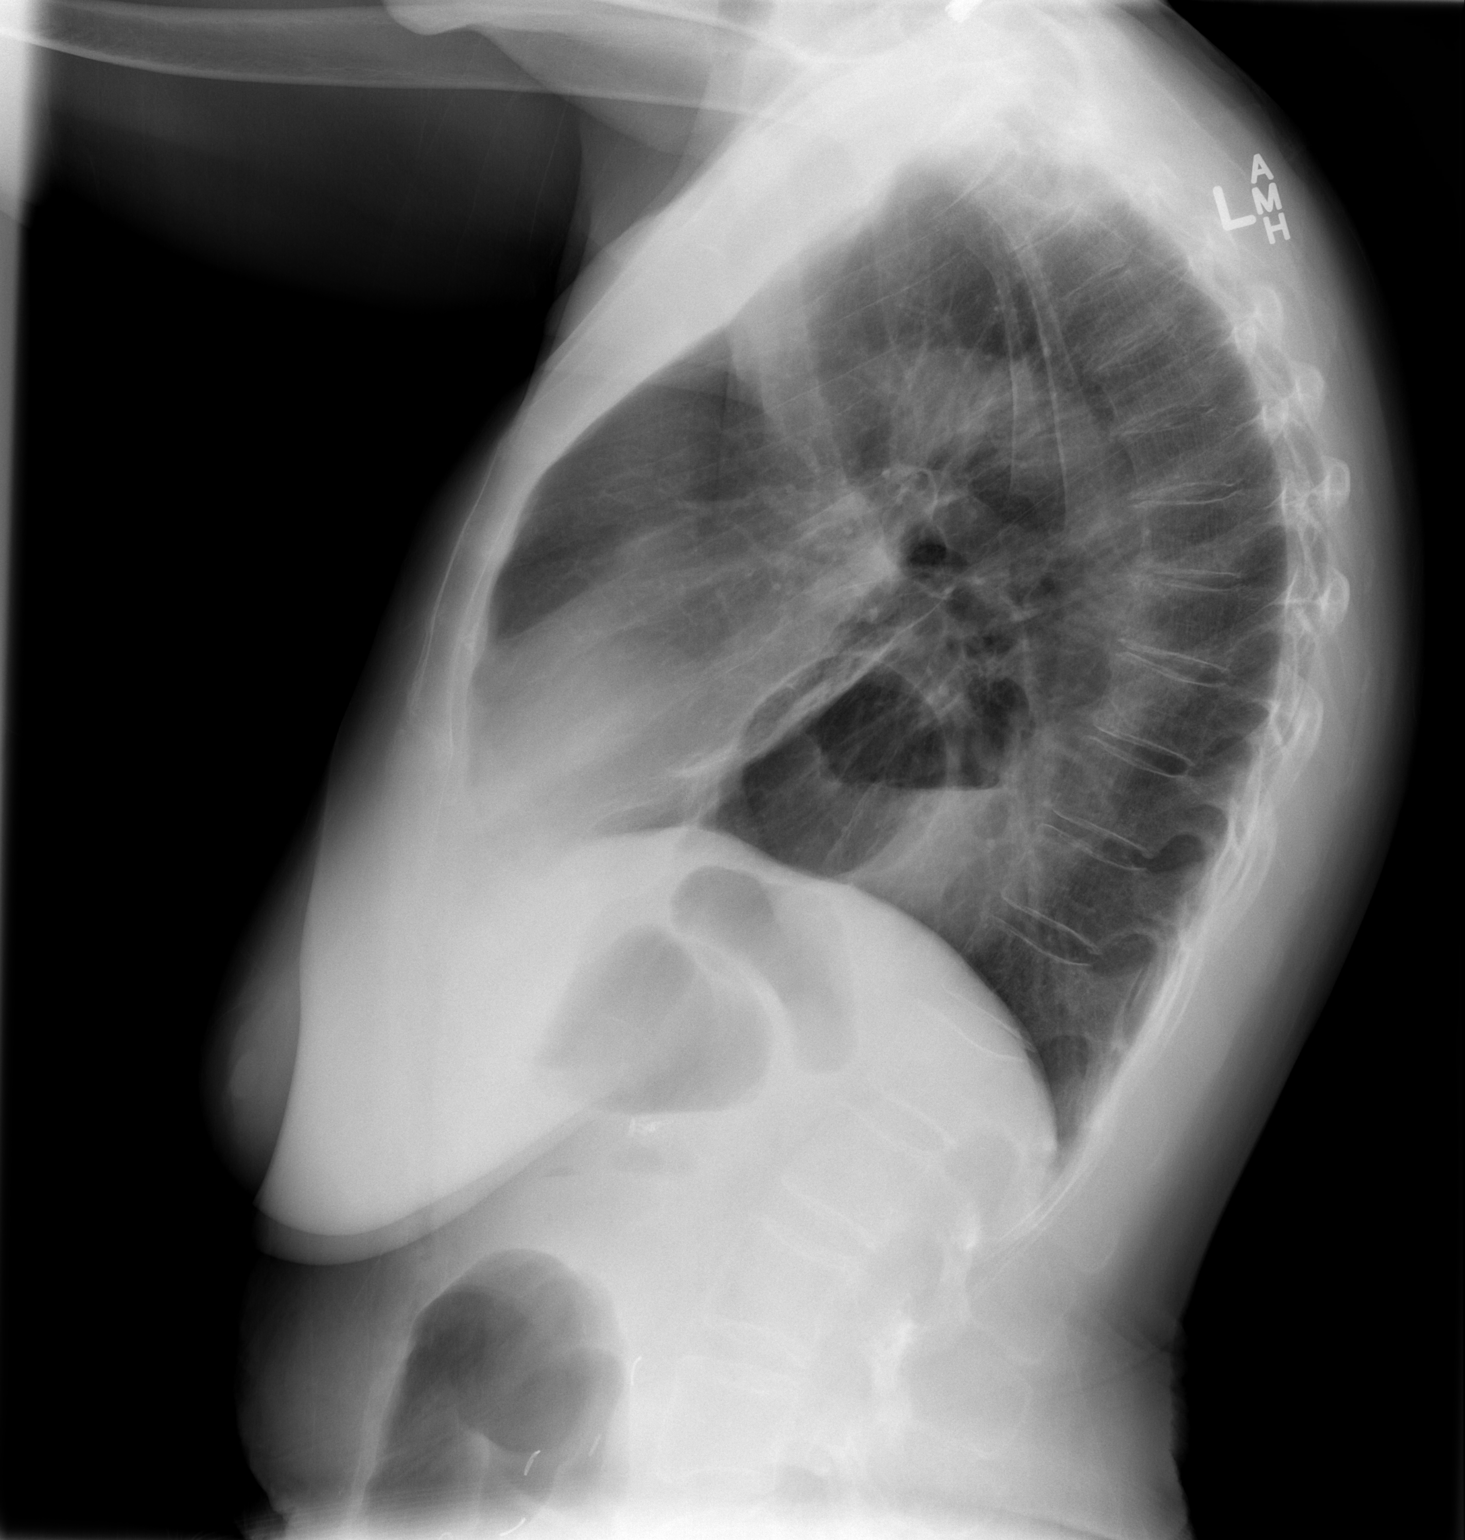

[2 of 2 positions shown; findings below may reference images not displayed]

FINDINGS: Cardiac shadow is stable.  A large hiatal hernia is again
seen.  The lungs are well-aerated bilaterally without focal
infiltrate or sizable effusion.  No bony abnormality is seen.
IMPRESSION: Large hiatal hernia is stable.  No acute abnormality is noted.

## 2014-03-11 IMAGING — CR DG HIP W/ PELVIS BILAT
5 series · 5 of 5 positions shown · non-contrast
Comparison: 03/25/2012 and prior chest radiographs

CLINICAL DATA: Bilateral hip pain.

BILATERAL HIP WITH PELVIS - 4+ VIEW

[t pelvis ap]
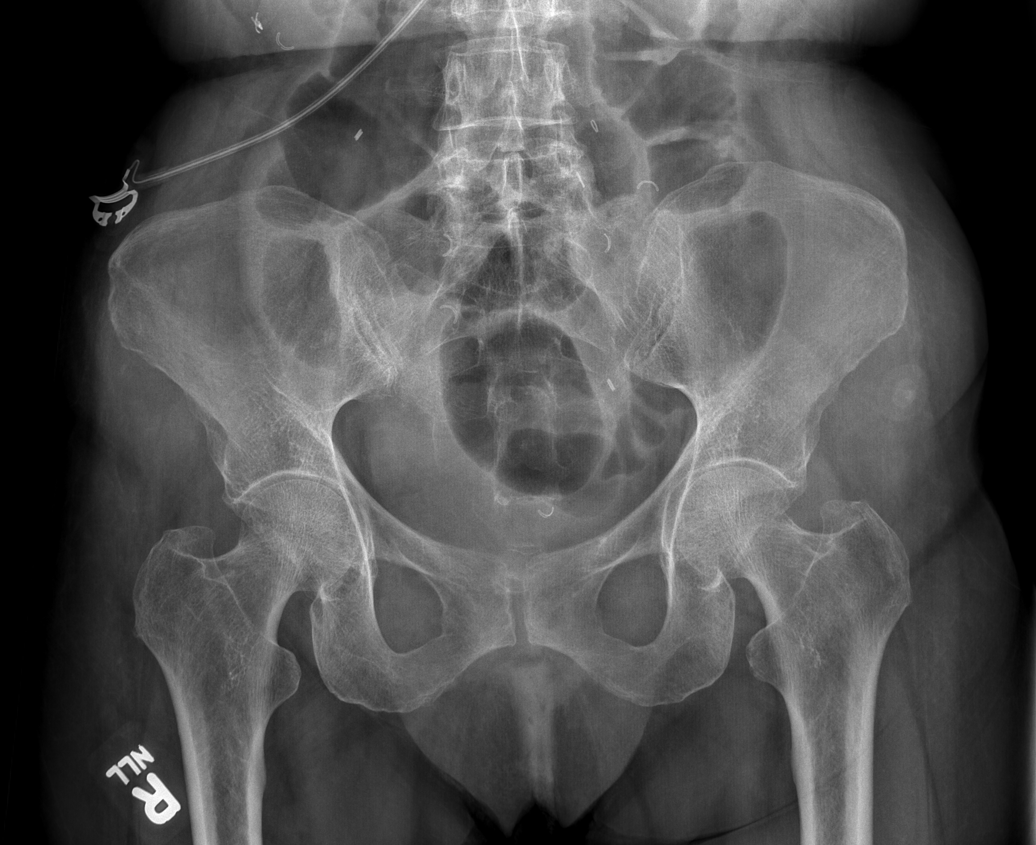

[t hip ap left]
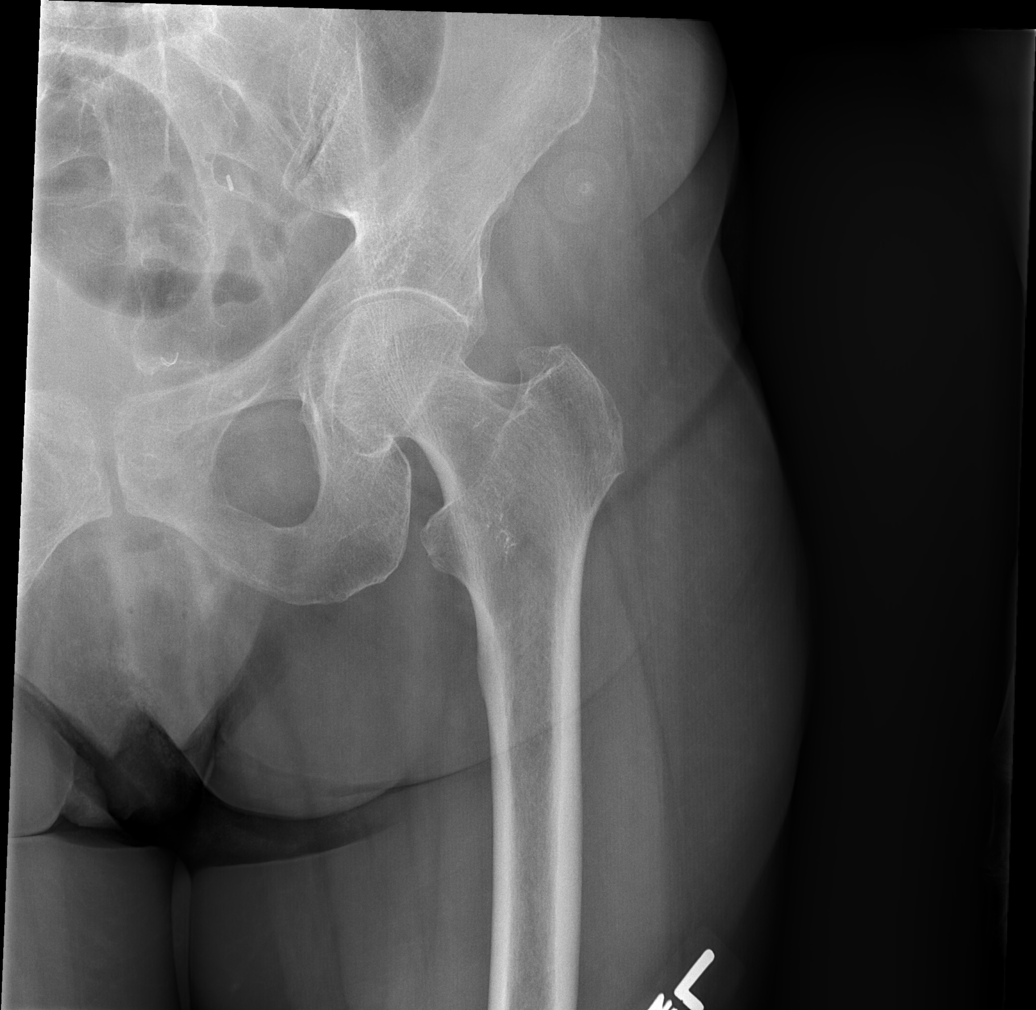

[t hip ap right]
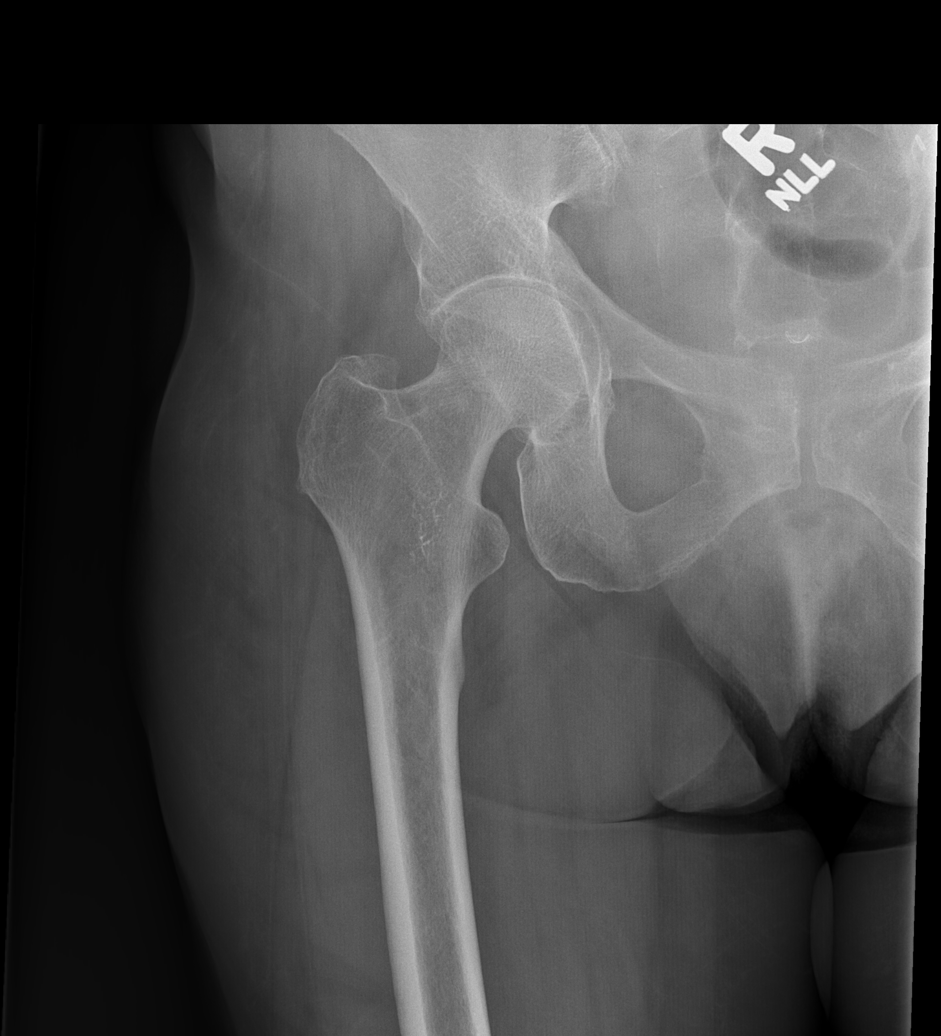

[t hip frog leg right]
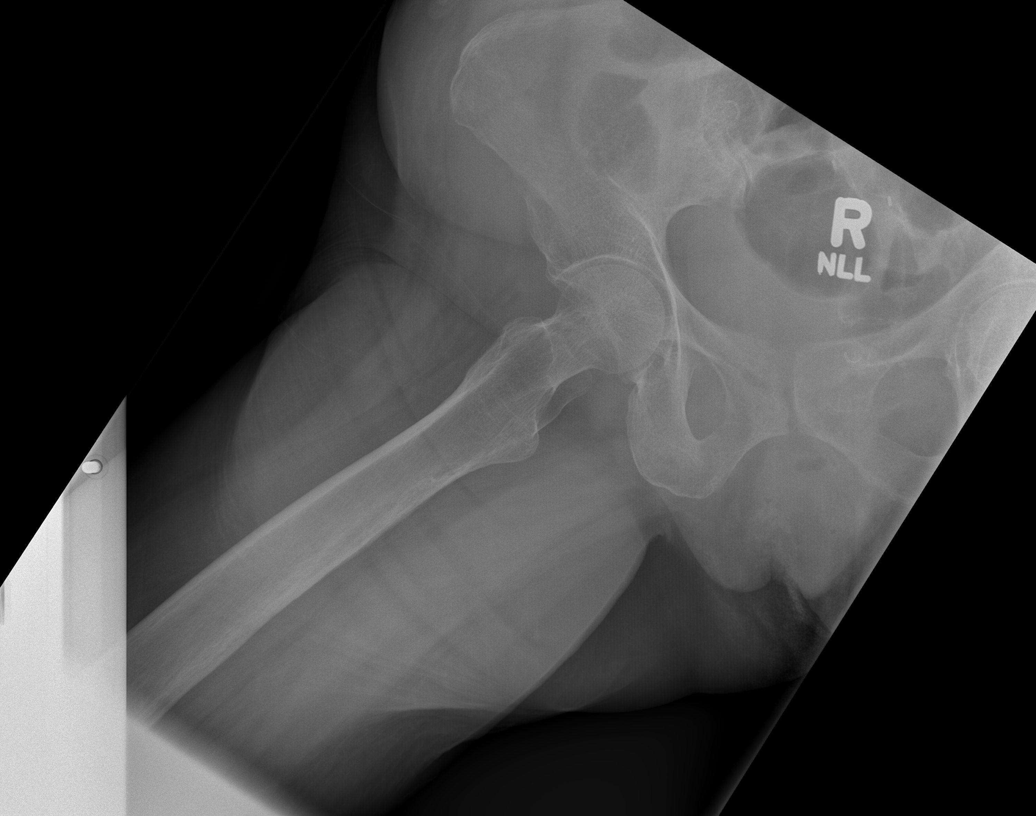

[t hip frog leg left]
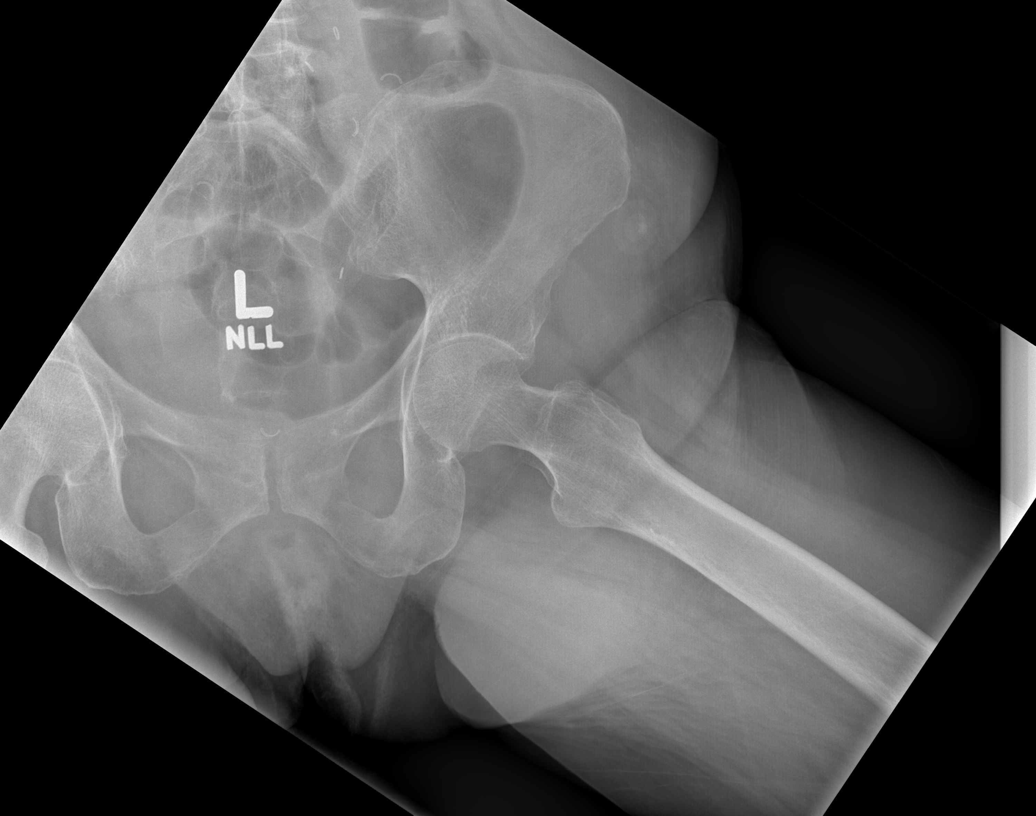

[5 of 5 positions shown; findings below may reference images not displayed]

FINDINGS: There is no evidence of fracture, subluxation or
dislocation.
Mild joint space narrowing bilaterally identified.
There is no evidence of focal bony lesions present.
No other significant abnormalities are identified.
IMPRESSION: No evidence of acute abnormality.

Mild joint space narrowing bilaterally.

## 2014-03-12 ENCOUNTER — Other Ambulatory Visit (INDEPENDENT_AMBULATORY_CARE_PROVIDER_SITE_OTHER): Payer: Self-pay | Admitting: Internal Medicine

## 2014-03-12 ENCOUNTER — Telehealth (INDEPENDENT_AMBULATORY_CARE_PROVIDER_SITE_OTHER): Payer: Self-pay | Admitting: *Deleted

## 2014-03-12 ENCOUNTER — Other Ambulatory Visit (INDEPENDENT_AMBULATORY_CARE_PROVIDER_SITE_OTHER): Payer: Self-pay | Admitting: *Deleted

## 2014-03-12 MED ORDER — ONDANSETRON HCL 4 MG PO TABS: 4.0000 mg | ORAL_TABLET | Freq: Two times a day (BID) | ORAL | Status: DC

## 2014-03-12 NOTE — Telephone Encounter (Signed)
Per Dr. Laural Golden the patient may have refill for Zofran 4 mg Take 1 by mouth twice a day #20 no refills. This was called to [facility]/[phone]. Escribed incorrectly.

## 2014-03-13 ENCOUNTER — Other Ambulatory Visit (INDEPENDENT_AMBULATORY_CARE_PROVIDER_SITE_OTHER): Payer: Self-pay | Admitting: Internal Medicine

## 2014-03-14 ENCOUNTER — Encounter (HOSPITAL_COMMUNITY)
Admission: RE | Admit: 2014-03-14 | Discharge: 2014-03-14 | Disposition: A | Discharge status: Home / Self Care | Payer: Medicare Other | Source: Ambulatory Visit | Attending: Internal Medicine | Admitting: Internal Medicine

## 2014-03-14 DIAGNOSIS — K5090 Crohn's disease, unspecified, without complications: Secondary | ICD-10-CM | POA: Diagnosis not present

## 2014-03-14 DIAGNOSIS — Z79899 Other long term (current) drug therapy: Secondary | ICD-10-CM | POA: Diagnosis not present

## 2014-03-14 MED ORDER — ACETAMINOPHEN 325 MG PO TABS
650.0000 mg | ORAL_TABLET | Freq: Once | ORAL | Status: AC
  Administered 2014-03-14: 650 mg via ORAL

## 2014-03-14 MED ORDER — LORATADINE 10 MG PO TABS
ORAL_TABLET | ORAL | Status: AC
  Filled 2014-03-14: qty 1

## 2014-03-14 MED ORDER — SODIUM CHLORIDE 0.9 % IV SOLN
300.0000 mg | INTRAVENOUS | Status: DC
  Administered 2014-03-14: 300 mg via INTRAVENOUS
  Filled 2014-03-14: qty 30

## 2014-03-14 MED ORDER — LORATADINE 10 MG PO TABS
10.0000 mg | ORAL_TABLET | Freq: Every day | ORAL | Status: DC
  Administered 2014-03-14: 10 mg via ORAL

## 2014-03-14 MED ORDER — ACETAMINOPHEN 325 MG PO TABS
ORAL_TABLET | ORAL | Status: AC
  Filled 2014-03-14: qty 2

## 2014-03-14 MED ORDER — ACETAMINOPHEN 325 MG PO TABS
ORAL_TABLET | ORAL | Status: AC
  Filled 2014-03-14: qty 1

## 2014-03-14 MED ORDER — SODIUM CHLORIDE 0.9 % IV SOLN
INTRAVENOUS | Status: DC
  Administered 2014-03-14: 11:00:00 via INTRAVENOUS

## 2014-03-14 NOTE — Progress Notes (Signed)
Awake. Voices no c/o at this time. D/C to home in good condition. 

## 2014-03-19 ENCOUNTER — Other Ambulatory Visit (INDEPENDENT_AMBULATORY_CARE_PROVIDER_SITE_OTHER): Payer: Self-pay | Admitting: Internal Medicine

## 2014-03-20 ENCOUNTER — Inpatient Hospital Stay (HOSPITAL_COMMUNITY)
Admission: EM | Admit: 2014-03-20 | Discharge: 2014-03-22 | DRG: 607 | Disposition: A | Discharge status: Home / Self Care | Payer: Medicare Other | Attending: Internal Medicine | Admitting: Internal Medicine

## 2014-03-20 ENCOUNTER — Emergency Department (HOSPITAL_COMMUNITY): Payer: Medicare Other

## 2014-03-20 ENCOUNTER — Encounter (HOSPITAL_COMMUNITY): Payer: Self-pay | Admitting: Emergency Medicine

## 2014-03-20 DIAGNOSIS — K56609 Unspecified intestinal obstruction, unspecified as to partial versus complete obstruction: Secondary | ICD-10-CM

## 2014-03-20 DIAGNOSIS — T380X5A Adverse effect of glucocorticoids and synthetic analogues, initial encounter: Secondary | ICD-10-CM | POA: Diagnosis present

## 2014-03-20 DIAGNOSIS — IMO0002 Reserved for concepts with insufficient information to code with codable children: Secondary | ICD-10-CM | POA: Diagnosis not present

## 2014-03-20 DIAGNOSIS — D649 Anemia, unspecified: Secondary | ICD-10-CM

## 2014-03-20 DIAGNOSIS — N390 Urinary tract infection, site not specified: Secondary | ICD-10-CM

## 2014-03-20 DIAGNOSIS — D72829 Elevated white blood cell count, unspecified: Secondary | ICD-10-CM

## 2014-03-20 DIAGNOSIS — E871 Hypo-osmolality and hyponatremia: Secondary | ICD-10-CM

## 2014-03-20 DIAGNOSIS — K219 Gastro-esophageal reflux disease without esophagitis: Secondary | ICD-10-CM | POA: Diagnosis present

## 2014-03-20 DIAGNOSIS — M109 Gout, unspecified: Secondary | ICD-10-CM | POA: Diagnosis present

## 2014-03-20 DIAGNOSIS — E872 Acidosis, unspecified: Secondary | ICD-10-CM

## 2014-03-20 DIAGNOSIS — Z87891 Personal history of nicotine dependence: Secondary | ICD-10-CM | POA: Diagnosis not present

## 2014-03-20 DIAGNOSIS — K611 Rectal abscess: Secondary | ICD-10-CM

## 2014-03-20 DIAGNOSIS — L989 Disorder of the skin and subcutaneous tissue, unspecified: Secondary | ICD-10-CM

## 2014-03-20 DIAGNOSIS — T363X5A Adverse effect of macrolides, initial encounter: Secondary | ICD-10-CM | POA: Diagnosis present

## 2014-03-20 DIAGNOSIS — R1310 Dysphagia, unspecified: Secondary | ICD-10-CM

## 2014-03-20 DIAGNOSIS — K5090 Crohn's disease, unspecified, without complications: Secondary | ICD-10-CM | POA: Diagnosis present

## 2014-03-20 DIAGNOSIS — M2550 Pain in unspecified joint: Secondary | ICD-10-CM | POA: Diagnosis present

## 2014-03-20 DIAGNOSIS — L270 Generalized skin eruption due to drugs and medicaments taken internally: Secondary | ICD-10-CM | POA: Diagnosis not present

## 2014-03-20 DIAGNOSIS — K449 Diaphragmatic hernia without obstruction or gangrene: Secondary | ICD-10-CM | POA: Diagnosis not present

## 2014-03-20 DIAGNOSIS — M545 Low back pain, unspecified: Secondary | ICD-10-CM

## 2014-03-20 DIAGNOSIS — E860 Dehydration: Secondary | ICD-10-CM

## 2014-03-20 DIAGNOSIS — N179 Acute kidney failure, unspecified: Secondary | ICD-10-CM

## 2014-03-20 DIAGNOSIS — S32010A Wedge compression fracture of first lumbar vertebra, initial encounter for closed fracture: Secondary | ICD-10-CM

## 2014-03-20 DIAGNOSIS — M549 Dorsalgia, unspecified: Secondary | ICD-10-CM

## 2014-03-20 DIAGNOSIS — R7989 Other specified abnormal findings of blood chemistry: Secondary | ICD-10-CM

## 2014-03-20 DIAGNOSIS — E876 Hypokalemia: Secondary | ICD-10-CM

## 2014-03-20 DIAGNOSIS — R112 Nausea with vomiting, unspecified: Secondary | ICD-10-CM

## 2014-03-20 DIAGNOSIS — R0602 Shortness of breath: Secondary | ICD-10-CM

## 2014-03-20 DIAGNOSIS — M129 Arthropathy, unspecified: Secondary | ICD-10-CM | POA: Diagnosis present

## 2014-03-20 DIAGNOSIS — R109 Unspecified abdominal pain: Secondary | ICD-10-CM

## 2014-03-20 DIAGNOSIS — J9811 Atelectasis: Secondary | ICD-10-CM

## 2014-03-20 DIAGNOSIS — R531 Weakness: Secondary | ICD-10-CM

## 2014-03-20 DIAGNOSIS — D473 Essential (hemorrhagic) thrombocythemia: Secondary | ICD-10-CM

## 2014-03-20 DIAGNOSIS — R945 Abnormal results of liver function studies: Secondary | ICD-10-CM

## 2014-03-20 DIAGNOSIS — R079 Chest pain, unspecified: Secondary | ICD-10-CM

## 2014-03-20 DIAGNOSIS — R059 Cough, unspecified: Secondary | ICD-10-CM

## 2014-03-20 DIAGNOSIS — R05 Cough: Secondary | ICD-10-CM

## 2014-03-20 DIAGNOSIS — Y92009 Unspecified place in unspecified non-institutional (private) residence as the place of occurrence of the external cause: Secondary | ICD-10-CM

## 2014-03-20 DIAGNOSIS — D75839 Thrombocytosis, unspecified: Secondary | ICD-10-CM

## 2014-03-20 DIAGNOSIS — R432 Parageusia: Secondary | ICD-10-CM

## 2014-03-20 DIAGNOSIS — R197 Diarrhea, unspecified: Secondary | ICD-10-CM

## 2014-03-20 LAB — PRO B NATRIURETIC PEPTIDE: PRO B NATRI PEPTIDE: 255.5 pg/mL — AB (ref 0–125)

## 2014-03-20 LAB — COMPREHENSIVE METABOLIC PANEL
ALBUMIN: 2.7 g/dL — AB (ref 3.5–5.2)
ALK PHOS: 97 U/L (ref 39–117)
ALT: 21 U/L (ref 0–35)
AST: 17 U/L (ref 0–37)
BUN: 19 mg/dL (ref 6–23)
CO2: 11 mEq/L — ABNORMAL LOW (ref 19–32)
Calcium: 7.1 mg/dL — ABNORMAL LOW (ref 8.4–10.5)
Chloride: 105 mEq/L (ref 96–112)
Creatinine, Ser: 1.7 mg/dL — ABNORMAL HIGH (ref 0.50–1.10)
GFR calc Af Amer: 35 mL/min — ABNORMAL LOW (ref >90)
GFR calc non Af Amer: 30 mL/min — ABNORMAL LOW (ref >90)
Glucose, Bld: 79 mg/dL (ref 70–99)
POTASSIUM: 2.5 meq/L — AB (ref 3.7–5.3)
Sodium: 135 mEq/L — ABNORMAL LOW (ref 137–147)
TOTAL PROTEIN: 6.7 g/dL (ref 6.0–8.3)

## 2014-03-20 LAB — CBC WITH DIFFERENTIAL/PLATELET
Basophils Absolute: 0 10*3/uL (ref 0.0–0.1)
Basophils Relative: 0 % (ref 0–1)
Eosinophils Absolute: 0.1 10*3/uL (ref 0.0–0.7)
Eosinophils Relative: 0 % (ref 0–5)
HCT: 28.4 % — ABNORMAL LOW (ref 36.0–46.0)
Hemoglobin: 9.8 g/dL — ABNORMAL LOW (ref 12.0–15.0)
Lymphocytes Relative: 26 % (ref 12–46)
Lymphs Abs: 5.2 10*3/uL — ABNORMAL HIGH (ref 0.7–4.0)
MCH: 28.5 pg (ref 26.0–34.0)
MCHC: 34.5 g/dL (ref 30.0–36.0)
MCV: 82.6 fL (ref 78.0–100.0)
Monocytes Absolute: 1.4 10*3/uL — ABNORMAL HIGH (ref 0.1–1.0)
Monocytes Relative: 7 % (ref 3–12)
Neutro Abs: 13.5 10*3/uL — ABNORMAL HIGH (ref 1.7–7.7)
Neutrophils Relative %: 67 % (ref 43–77)
Platelets: 356 10*3/uL (ref 150–400)
RBC: 3.44 MIL/uL — ABNORMAL LOW (ref 3.87–5.11)
RDW: 15.9 % — ABNORMAL HIGH (ref 11.5–15.5)
WBC: 20.3 10*3/uL — ABNORMAL HIGH (ref 4.0–10.5)

## 2014-03-20 LAB — MAGNESIUM: Magnesium: 1 mg/dL — ABNORMAL LOW (ref 1.5–2.5)

## 2014-03-20 LAB — I-STAT CG4 LACTIC ACID, ED: Lactic Acid, Venous: 1.21 mmol/L (ref 0.5–2.2)

## 2014-03-20 MED ORDER — SIMETHICONE 80 MG PO CHEW
80.0000 mg | CHEWABLE_TABLET | Freq: Four times a day (QID) | ORAL | Status: DC | PRN
  Filled 2014-03-20: qty 1

## 2014-03-20 MED ORDER — DIPHENHYDRAMINE HCL 25 MG PO CAPS
25.0000 mg | ORAL_CAPSULE | Freq: Four times a day (QID) | ORAL | Status: DC | PRN
  Administered 2014-03-20 – 2014-03-22 (×4): 25 mg via ORAL
  Filled 2014-03-20 (×3): qty 1

## 2014-03-20 MED ORDER — HYDROCODONE-ACETAMINOPHEN 7.5-325 MG PO TABS
1.0000 | ORAL_TABLET | Freq: Four times a day (QID) | ORAL | Status: DC | PRN
  Administered 2014-03-21 – 2014-03-22 (×2): 1 via ORAL
  Filled 2014-03-20 (×2): qty 1

## 2014-03-20 MED ORDER — ACETAMINOPHEN 650 MG RE SUPP: 650.0000 mg | Freq: Four times a day (QID) | RECTAL | Status: DC | PRN

## 2014-03-20 MED ORDER — ALBUTEROL SULFATE (2.5 MG/3ML) 0.083% IN NEBU: 2.5000 mg | INHALATION_SOLUTION | Freq: Four times a day (QID) | RESPIRATORY_TRACT | Status: DC | PRN

## 2014-03-20 MED ORDER — HEPARIN SODIUM (PORCINE) 5000 UNIT/ML IJ SOLN
5000.0000 [IU] | Freq: Three times a day (TID) | INTRAMUSCULAR | Status: DC
  Administered 2014-03-20 – 2014-03-21 (×4): 5000 [IU] via SUBCUTANEOUS
  Filled 2014-03-20 (×8): qty 1

## 2014-03-20 MED ORDER — ZOLPIDEM TARTRATE 5 MG PO TABS
5.0000 mg | ORAL_TABLET | Freq: Every evening | ORAL | Status: DC | PRN
  Administered 2014-03-20 – 2014-03-21 (×2): 5 mg via ORAL
  Filled 2014-03-20 (×2): qty 1

## 2014-03-20 MED ORDER — ALBUTEROL SULFATE HFA 108 (90 BASE) MCG/ACT IN AERS
2.0000 | INHALATION_SPRAY | Freq: Four times a day (QID) | RESPIRATORY_TRACT | Status: DC | PRN
  Filled 2014-03-20: qty 6.7

## 2014-03-20 MED ORDER — FLUCONAZOLE 150 MG PO TABS
150.0000 mg | ORAL_TABLET | Freq: Once | ORAL | Status: AC
  Administered 2014-03-20: 150 mg via ORAL
  Filled 2014-03-20: qty 1

## 2014-03-20 MED ORDER — DICYCLOMINE HCL 10 MG/5ML PO SOLN
20.0000 mg | Freq: Two times a day (BID) | ORAL | Status: DC
  Administered 2014-03-20 – 2014-03-22 (×4): 20 mg via ORAL
  Filled 2014-03-20 (×8): qty 10

## 2014-03-20 MED ORDER — POTASSIUM CHLORIDE 10 MEQ/100ML IV SOLN
10.0000 meq | Freq: Once | INTRAVENOUS | Status: AC
  Administered 2014-03-20: 10 meq via INTRAVENOUS
  Filled 2014-03-20: qty 100

## 2014-03-20 MED ORDER — SODIUM CHLORIDE 0.9 % IV SOLN
INTRAVENOUS | Status: DC
  Administered 2014-03-20: 22:00:00 via INTRAVENOUS
  Administered 2014-03-21 (×2): 1000 mL via INTRAVENOUS

## 2014-03-20 MED ORDER — POTASSIUM CHLORIDE 10 MEQ/100ML IV SOLN
10.0000 meq | Freq: Once | INTRAVENOUS | Status: DC
  Filled 2014-03-20: qty 100

## 2014-03-20 MED ORDER — SODIUM CHLORIDE 0.9 % IV SOLN: INTRAVENOUS | Status: DC

## 2014-03-20 MED ORDER — SODIUM CHLORIDE 0.9 % IV BOLUS (SEPSIS)
250.0000 mL | Freq: Once | INTRAVENOUS | Status: AC
  Administered 2014-03-20: 250 mL via INTRAVENOUS

## 2014-03-20 MED ORDER — POTASSIUM CHLORIDE CRYS ER 20 MEQ PO TBCR
40.0000 meq | EXTENDED_RELEASE_TABLET | ORAL | Status: AC
  Administered 2014-03-20 (×3): 40 meq via ORAL
  Filled 2014-03-20 (×3): qty 2

## 2014-03-20 MED ORDER — ONDANSETRON HCL 4 MG PO TABS: 4.0000 mg | ORAL_TABLET | Freq: Four times a day (QID) | ORAL | Status: DC | PRN

## 2014-03-20 MED ORDER — ONDANSETRON HCL 4 MG PO TABS
4.0000 mg | ORAL_TABLET | Freq: Two times a day (BID) | ORAL | Status: DC
  Administered 2014-03-20: 4 mg via ORAL
  Filled 2014-03-20 (×4): qty 1

## 2014-03-20 MED ORDER — ACETAMINOPHEN 325 MG PO TABS
650.0000 mg | ORAL_TABLET | Freq: Four times a day (QID) | ORAL | Status: DC | PRN
  Administered 2014-03-21: 650 mg via ORAL
  Filled 2014-03-20: qty 2

## 2014-03-20 MED ORDER — SODIUM CHLORIDE 0.9 % IJ SOLN: 3.0000 mL | Freq: Two times a day (BID) | INTRAMUSCULAR | Status: DC

## 2014-03-20 MED ORDER — ONDANSETRON HCL 4 MG/2ML IJ SOLN
4.0000 mg | Freq: Four times a day (QID) | INTRAMUSCULAR | Status: DC | PRN
  Administered 2014-03-21 (×2): 4 mg via INTRAVENOUS
  Filled 2014-03-20 (×3): qty 2

## 2014-03-20 MED ORDER — COLCHICINE 0.6 MG PO TABS
0.6000 mg | ORAL_TABLET | Freq: Every day | ORAL | Status: DC
  Filled 2014-03-20 (×3): qty 1

## 2014-03-20 MED ORDER — PANTOPRAZOLE SODIUM 40 MG PO TBEC
40.0000 mg | DELAYED_RELEASE_TABLET | Freq: Two times a day (BID) | ORAL | Status: DC
  Administered 2014-03-20 – 2014-03-22 (×4): 40 mg via ORAL
  Filled 2014-03-20 (×4): qty 1

## 2014-03-20 NOTE — ED Notes (Signed)
Pt not tolerating IV potassium well. Rate changed to 50 ml/hr.

## 2014-03-20 NOTE — ED Notes (Signed)
IV team to come retry if Antony Haste, RN unsuccessful.

## 2014-03-20 NOTE — ED Notes (Addendum)
Per pt's permission: Dr. Fransico Setters office called to see if they have a dermatologist they can refer her for. Referral specialist will call [facility].

## 2014-03-20 NOTE — ED Notes (Signed)
Admitting MD at bedside.

## 2014-03-20 NOTE — ED Notes (Signed)
MD at bedside. 

## 2014-03-20 NOTE — ED Notes (Signed)
Patient transported to X-ray 

## 2014-03-20 NOTE — ED Notes (Signed)
IV access obtained by ultrasound. Called IV team to cancel PIV start.

## 2014-03-20 NOTE — H&P (Addendum)
[facility] History and Physical  Danielle [name] GLO:[id number] DOB: [birth year] DOA: 03/20/2014  Referring physician: er PCP: Maggie Font, MD   Chief Complaint: rash  HPI: Danielle [name] is a 68 y.o. female  With a history of Crohn's disease, well controlled.  About 2 weeks ago, patient had URI type symptoms and was prescribed a z pak.  Patient then developed some swelling of her lips and red areas on her skin- face, legs, back- most of her front torso was not affected.  Some of the  areas started as blisters.  She was then seen by her GI and GYN doctors- give a course of steroids as well as ointment (a lotrimin).  Patient has been slowly improving.  No involvement of her mouth or trouble swallowing.  Lesions are not painful or itching patient has been applying pertroleum jelly to keep them from drying- denies scratching.    In the Er, patient was found to have a low K- her Cr was slightly elevated as well.  Patient refused transfer to [facility] for Dermatology consult.  CO2 was low as well on initial labs- ER was sending her home except her K came back too low.  She has been unable to be referred to derm as none in this area will take her insurance   Review of Systems:  All systems reviewed, negative unless stated above   Past Medical History  Diagnosis Date  . Crohn's disease   . Chronic diarrhea   . Joint pain   . GERD (gastroesophageal reflux disease)   . Polyarthralgia   . Hiatal hernia   . Hemorrhoids   . Anemia   . Hernia (acquired) (recurrent)     hiatal  . Pneumonia 2014  . Exertional shortness of breath     "q once in awhile" (08/22/2013)  . Arthritis     "all over" (08/22/2013)  . Gout     "have had it in both hands; left hand is the worse" (08/22/2013)   Past Surgical History  Procedure Laterality Date  . Bowel resection  2009  . 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 Anal stenosis  ~ 2010  . Colonoscopy  2013  . Cholecystectomy  1990's  . Appendectomy  1980's    . Goiter  1999  . Esophagogastroduodenoscopy  11/11/2011    Procedure: ESOPHAGOGASTRODUODENOSCOPY (EGD);  Surgeon: Missy Sabins, MD;  Location: [facility] ENDOSCOPY;  Service: Endoscopy;  Laterality: N/A;  . Flexible sigmoidoscopy  11/11/2011    Procedure: FLEXIBLE SIGMOIDOSCOPY;  Surgeon: Missy Sabins, MD;  Location: [place];  Service: Endoscopy;  Laterality: N/A;  . Esophagogastroduodenoscopy  05/18/2012    Procedure: ESOPHAGOGASTRODUODENOSCOPY (EGD);  Surgeon: Rogene Houston, MD;  Location: [facility] ENDO SUITE;  Service: Endoscopy;  Laterality: N/A;  200  . Colostomy takedown  2009    "only had it for about 1 month" (08/22/2013)   Social History:  reports that she quit smoking about 17 years ago. Her smoking use included Cigarettes. She has a .48 pack-year smoking history. She has never used smokeless tobacco. She reports that she drinks about 1.2 ounces of alcohol per week. She reports that she does not use illicit drugs.  Allergies  Allergen Reactions  . Zithromax [Azithromycin] Swelling    Patient states that she has a swollen mouth/ blisters, yeast present mouth and vaginia.  . Aspirin Other (See Comments)    Cramps her stomach  . Sulfa Antibiotics Nausea And Vomiting    Family  History  Problem Relation Age of Onset  . Diabetes Mother   . Healthy Sister   . Hypertension Brother   . Colon cancer Brother     died with colon cancer  . Heart disease Father      Prior to Admission medications   Medication Sig Start Date End Date Taking? Authorizing Provider  albuterol (PROVENTIL HFA;VENTOLIN HFA) 108 (90 BASE) MCG/ACT inhaler Inhale 2 puffs into the lungs every 6 (six) hours as needed for wheezing. 02/27/13  Yes Nishant Dhungel, MD  alendronate (FOSAMAX) 70 MG tablet Take 1 tablet (70 mg total) by mouth every 7 (seven) days. 09/04/13  Yes Rogene Houston, MD  Calcium Carbonate-Vitamin D (CALCIUM + D PO) Take 1 tablet by mouth daily.   Yes Historical Provider, MD  COLCRYS 0.6 MG tablet Take  0.6 mg by mouth daily.  07/27/13  Yes Historical Provider, MD  Cyanocobalamin (VITAMIN B-12 IJ) Inject 1 each as directed every 30 (thirty) days. Around the 1st week of each month   Yes Historical Provider, MD  dicyclomine (BENTYL) 10 MG/5ML syrup Take 20 mg by mouth 2 (two) times daily before a meal.   Yes Historical Provider, MD  HYDROcodone-acetaminophen (NORCO) 7.5-325 MG per tablet Take 1 tablet by mouth every 6 (six) hours as needed for moderate pain.   Yes Historical Provider, MD  inFLIXimab (REMICADE) 100 MG injection Inject 100 mg into the vein every 8 (eight) weeks.   Yes Historical Provider, MD  Multiple Vitamin (MULTIVITAMIN WITH MINERALS) TABS Take 1 tablet by mouth daily.   Yes Historical Provider, MD  ondansetron (ZOFRAN) 4 MG tablet Take 1 tablet (4 mg total) by mouth 2 (two) times daily. 03/12/14  Yes Rogene Houston, MD  pantoprazole (PROTONIX) 40 MG tablet Take 1 tablet (40 mg total) by mouth 2 (two) times daily before a meal. 09/04/13  Yes Rogene Houston, MD  potassium chloride SA (K-DUR,KLOR-CON) 20 MEQ tablet Take 1 tablet (20 mEq total) by mouth 2 (two) times daily. 02/27/13  Yes Nishant Dhungel, MD  Simethicone (PHAZYME) 180 MG CAPS Take 1 capsule (180 mg total) by mouth 3 (three) times daily as needed. 11/06/13  Yes Rogene Houston, MD  spironolactone (ALDACTONE) 50 MG tablet Take 50 mg by mouth daily.  01/10/13  Yes Historical Provider, MD  zolpidem (AMBIEN) 10 MG tablet Take 10 mg by mouth at bedtime as needed for sleep.  01/04/14  Yes Historical Provider, MD  predniSONE (DELTASONE) 5 MG tablet Take 5 mg by mouth See admin instructions. Taper as directed: Take 60 mg on the first day, then 50 mg on the second day, then 40 mg on the third day, then 30 mg on the fourth day, then 20 mg on the fifth day, then 10 mg on the sixth day.    Historical Provider, MD   Physical Exam: Filed Vitals:   03/20/14 1300  BP: 111/63  Pulse:   Temp:   Resp: 16    BP 111/63  Pulse 89  Temp(Src)  97.9 F (36.6 C) (Oral)  Resp 16  SpO2 100%  General:  Appears calm and comfortable Eyes: PERRL, normal lids, irises & conjunctiva ENT: grossly normal hearing, lips & tongue Neck: no LAD, masses or thyromegaly Cardiovascular: RRR, no m/r/g. No LE edema. Respiratory: CTA bilaterally, no w/r/r. Normal respiratory effort. Abdomen: soft, ntnd Skin: Bilateral cheeks with about 2 cm patches of redness skin lesions. Also scattered lesions throughout the body some with vesicles some with the  spherical lesions measuring about 2 cm to 1 cm - appear wet. None on the abdomen some on the back some on the arms and some on the legs.  No mouth involement Musculoskeletal: grossly normal tone BUE/BLE Psychiatric: grossly normal mood and affect, speech fluent and appropriate Neurologic: grossly non-focal.          Labs on Admission:  Basic Metabolic Panel:  Recent Labs Lab 03/20/14 1052  NA 135*  K 2.5*  CL 105  CO2 11*  GLUCOSE 79  BUN 19  CREATININE 1.70*  CALCIUM 7.1*   Liver Function Tests:  Recent Labs Lab 03/20/14 1052  AST 17  ALT 21  ALKPHOS 97  BILITOT <0.2*  PROT 6.7  ALBUMIN 2.7*   No results found for this basename: LIPASE, AMYLASE,  in the last 168 hours No results found for this basename: AMMONIA,  in the last 168 hours CBC:  Recent Labs Lab 03/20/14 1052  WBC 20.3*  NEUTROABS 13.5*  HGB 9.8*  HCT 28.4*  MCV 82.6  PLT 356   Cardiac Enzymes: No results found for this basename: CKTOTAL, CKMB, CKMBINDEX, TROPONINI,  in the last 168 hours  BNP (last 3 results)  Recent Labs  04/28/13 2305 03/20/14 1052  PROBNP 215.7* 255.5*   CBG: No results found for this basename: GLUCAP,  in the last 168 hours  Radiological Exams on Admission: Dg Chest 2 View  03/20/2014   CLINICAL DATA:  Shortness of breath  EXAM: CHEST  2 VIEW  COMPARISON:  07/12/2013  FINDINGS: A large hiatal hernia is again identified. The cardiac shadow is stable. Old rib fractures are seen on  the right. The lungs are clear. No focal infiltrate or sizable effusion is noted.  IMPRESSION: No active cardiopulmonary disease.   Electronically Signed   By: Inez Catalina M.D.   On: 03/20/2014 10:23    EKG: Independently reviewed. Sinus with PVCs  Assessment/Plan Principal Problem:   Skin lesion Active Problems:   Hypokalemia   Crohn's disease   Leukocytosis, unspecified   Skin lesions- ? Drug reaction- appeared after azithromycin, does not appear to be disseminated herpes but will get viral swab.  ? Skin manifestation of Crohn's- does not appear to be this either.  monitor  Hypokalemia- replete PO and IV and check Mg  Leukocytosis- ? Related to steroids, monitor  Crohn's disease- on remicade- rash was here before last dose  NO IV access, will ask ER doc to place EJ  Code Status: full Family Communication: patient Disposition Plan: admit  Time spent: 75 min  [facility] Pager [phone]

## 2014-03-20 NOTE — ED Notes (Addendum)
[facility] paged regarding IV access. Hospitalist requesting ED MD place EJ.

## 2014-03-20 NOTE — ED Notes (Signed)
Pt had angioedema at first but not anymore.

## 2014-03-20 NOTE — ED Notes (Signed)
IV team at bedside 

## 2014-03-20 NOTE — ED Notes (Signed)
Pt returned from xray

## 2014-03-20 NOTE — ED Notes (Signed)
IV team paged.  

## 2014-03-20 NOTE — ED Notes (Signed)
EDP made aware [facility] requesting EJ be placed; EDP requesting RN page and ask IV team to come try again.

## 2014-03-20 NOTE — ED Notes (Signed)
Pts sister requesting to be called once the pt gets moved to a room. Velva Harman (sister)- [phone]

## 2014-03-20 NOTE — ED Notes (Signed)
RN attempted to get blood; failed. Phleb to come.

## 2014-03-20 NOTE — ED Notes (Addendum)
Report given to [place]. Antony Haste, RN at bedside with [place] attempting to find IV access.

## 2014-03-20 NOTE — ED Notes (Signed)
IV team paged; 2 attempts unsuccessful by RN

## 2014-03-20 NOTE — ED Notes (Signed)
NOTIFIED DR. ZACKOWSKI IN PRESON OF PATIENTS LAB RESULTS OF CCG4+ LACTIC ACID ,@12 :20 PM ,03/20/2014.

## 2014-03-20 NOTE — ED Provider Notes (Addendum)
CSN: [id number]     Arrival date & time 03/20/14  0915 History   First MD Initiated Contact with Patient 03/20/14 [phone]     Chief Complaint  Patient presents with  . Allergic Reaction  . Rash     (Consider location/radiation/quality/duration/timing/severity/associated sxs/prior Treatment) Patient is a 68 y.o. female presenting with allergic reaction and rash. The history is provided by the patient and a relative.  Allergic Reaction Presenting symptoms: rash   Presenting symptoms: no difficulty swallowing   Rash Associated symptoms: fever and shortness of breath   Associated symptoms: no abdominal pain, no headaches, no nausea and not vomiting    patient with presumed reaction to Zithromax Z-Pak initially started with some swelling of the lips and then started to develop red areas on the scan and in those areas blistered. Patient has been treated with a course of prednisone over 7 days tapered no relative change also treated with Lotrimin no real change. Patient been seen by her GYN doctor her OB/GYN Dr. patient has a history of Crohn's. These lesions are now on her legs back and face and arms. No pain associated with them. There is some itching. Patient has some mild shortness of breath today. No further lip swelling or tongue swelling. Overall process may have been over the last 2 weeks. Even though patient said a week ago that she took the Z-Pak.  Past Medical History  Diagnosis Date  . Crohn's disease   . Chronic diarrhea   . Joint pain   . GERD (gastroesophageal reflux disease)   . Polyarthralgia   . Hiatal hernia   . Hemorrhoids   . Anemia   . Hernia (acquired) (recurrent)     hiatal  . Pneumonia 2014  . Exertional shortness of breath     "q once in awhile" (08/22/2013)  . Arthritis     "all over" (08/22/2013)  . Gout     "have had it in both hands; left hand is the worse" (08/22/2013)   Past Surgical History  Procedure Laterality Date  . Bowel resection  2009  . Colostomy   2009    DEMASO  . Anal stenosis  ~ 2010  . Colonoscopy  2013  . Cholecystectomy  1990's  . Appendectomy  1980's  . Goiter  1999  . Esophagogastroduodenoscopy  11/11/2011    Procedure: ESOPHAGOGASTRODUODENOSCOPY (EGD);  Surgeon: Missy Sabins, MD;  Location: [facility] ENDOSCOPY;  Service: Endoscopy;  Laterality: N/A;  . Flexible sigmoidoscopy  11/11/2011    Procedure: FLEXIBLE SIGMOIDOSCOPY;  Surgeon: Missy Sabins, MD;  Location: [place];  Service: Endoscopy;  Laterality: N/A;  . Esophagogastroduodenoscopy  05/18/2012    Procedure: ESOPHAGOGASTRODUODENOSCOPY (EGD);  Surgeon: Rogene Houston, MD;  Location: [facility] ENDO [facility];  Service: Endoscopy;  Laterality: N/A;  200  . Colostomy takedown  2009    "only had it for about 1 month" (08/22/2013)   Family History  Problem Relation Age of Onset  . Diabetes Mother   . Healthy Sister   . Hypertension Brother   . Colon cancer Brother     died with colon cancer  . Heart disease Father    History  Substance Use Topics  . Smoking status: Former Smoker -- 0.12 packs/day for 4 years    Types: Cigarettes    Quit date: 10/05/1996  . Smokeless tobacco: Never Used     Comment: 08/22/2013 1 pack every two weeks when she did smoked  . Alcohol Use: 1.2 oz/week  2 Glasses of wine per week     Comment: 08/22/2013 "glass of wine maybe twice/wk"   OB History   Grav Para Term Preterm Abortions TAB SAB Ect Mult Living                 Review of Systems  Constitutional: Positive for fever.  HENT: Negative for congestion and trouble swallowing.   Eyes: Negative for photophobia, pain, redness and visual disturbance.  Respiratory: Positive for shortness of breath.   Cardiovascular: Negative for chest pain.  Gastrointestinal: Negative for nausea, vomiting and abdominal pain.  Genitourinary: Negative for dysuria.  Musculoskeletal: Negative for back pain.  Skin: Positive for rash.  Neurological: Negative for headaches.  Hematological: Does not bruise/bleed  easily.  Psychiatric/Behavioral: Negative for confusion.      Allergies  Zithromax; Aspirin; and Sulfa antibiotics  Home Medications   Prior to Admission medications   Medication Sig Start Date End Date Taking? Authorizing Provider  albuterol (PROVENTIL HFA;VENTOLIN HFA) 108 (90 BASE) MCG/ACT inhaler Inhale 2 puffs into the lungs every 6 (six) hours as needed for wheezing. 02/27/13  Yes Nishant Dhungel, MD  alendronate (FOSAMAX) 70 MG tablet Take 1 tablet (70 mg total) by mouth every 7 (seven) days. 09/04/13  Yes Rogene Houston, MD  Calcium Carbonate-Vitamin D (CALCIUM + D PO) Take 1 tablet by mouth daily.   Yes Historical Provider, MD  COLCRYS 0.6 MG tablet Take 0.6 mg by mouth daily.  07/27/13  Yes Historical Provider, MD  Cyanocobalamin (VITAMIN B-12 IJ) Inject 1 each as directed every 30 (thirty) days. Around the 1st week of each month   Yes Historical Provider, MD  dicyclomine (BENTYL) 10 MG/5ML syrup Take 20 mg by mouth 2 (two) times daily before a meal.   Yes Historical Provider, MD  HYDROcodone-acetaminophen (NORCO) 7.5-325 MG per tablet Take 1 tablet by mouth every 6 (six) hours as needed for moderate pain.   Yes Historical Provider, MD  inFLIXimab (REMICADE) 100 MG injection Inject 100 mg into the vein every 8 (eight) weeks.   Yes Historical Provider, MD  Multiple Vitamin (MULTIVITAMIN WITH MINERALS) TABS Take 1 tablet by mouth daily.   Yes Historical Provider, MD  ondansetron (ZOFRAN) 4 MG tablet Take 1 tablet (4 mg total) by mouth 2 (two) times daily. 03/12/14  Yes Rogene Houston, MD  pantoprazole (PROTONIX) 40 MG tablet Take 1 tablet (40 mg total) by mouth 2 (two) times daily before a meal. 09/04/13  Yes Rogene Houston, MD  potassium chloride SA (K-DUR,KLOR-CON) 20 MEQ tablet Take 1 tablet (20 mEq total) by mouth 2 (two) times daily. 02/27/13  Yes Nishant Dhungel, MD  Simethicone (PHAZYME) 180 MG CAPS Take 1 capsule (180 mg total) by mouth 3 (three) times daily as needed. 11/06/13   Yes Rogene Houston, MD  spironolactone (ALDACTONE) 50 MG tablet Take 50 mg by mouth daily.  01/10/13  Yes Historical Provider, MD  zolpidem (AMBIEN) 10 MG tablet Take 10 mg by mouth at bedtime as needed for sleep.  01/04/14  Yes Historical Provider, MD  predniSONE (DELTASONE) 5 MG tablet Take 5 mg by mouth See admin instructions. Taper as directed: Take 60 mg on the first day, then 50 mg on the second day, then 40 mg on the third day, then 30 mg on the fourth day, then 20 mg on the fifth day, then 10 mg on the sixth day.    Historical Provider, MD   BP 86/54  Pulse 80  Temp(Src)  97.9 F (36.6 C) (Oral)  Resp 18  SpO2 100% Physical Exam  Nursing note and vitals reviewed. Constitutional: She is oriented to person, place, and time. She appears well-developed and well-nourished. No distress.  HENT:  Head: Normocephalic and atraumatic.  Mouth/Throat: Oropharynx is clear and moist.  Eyes: Conjunctivae and EOM are normal. Pupils are equal, round, and reactive to light.  Neck: Normal range of motion.  Cardiovascular: Normal rate, regular rhythm and normal heart sounds.   No murmur heard. Pulmonary/Chest: Effort normal and breath sounds normal. No respiratory distress.  Abdominal: Soft. Bowel sounds are normal. There is no tenderness.  Musculoskeletal: Normal range of motion.  Referred to skin exam  Neurological: She is alert and oriented to person, place, and time. No cranial nerve deficit. She exhibits normal muscle tone. Coordination normal.  Skin: Skin is warm. Rash noted.  Bilateral cheeks with about 2 cm patches of redness skin lesions. Also scattered lesions throughout the body some with vesicles some with the spherical lesions measuring about 2 cm to 1 cm with dried areas. None on the abdomen some on the back some on the arms and some on the legs. The vesicles to have a similar appearance to herpes zoster however clinically this does not fit and has never been any pain.    ED Course   Procedures (including critical care time) Labs Review Labs Reviewed  CBC WITH DIFFERENTIAL - Abnormal; Notable for the following:    WBC 20.3 (*)    RBC 3.44 (*)    Hemoglobin 9.8 (*)    HCT 28.4 (*)    RDW 15.9 (*)    Neutro Abs 13.5 (*)    Lymphs Abs 5.2 (*)    Monocytes Absolute 1.4 (*)    All other components within normal limits  COMPREHENSIVE METABOLIC PANEL - Abnormal; Notable for the following:    Sodium 135 (*)    Potassium 2.5 (*)    CO2 11 (*)    Creatinine, Ser 1.70 (*)    Calcium 7.1 (*)    Albumin 2.7 (*)    Total Bilirubin <0.2 (*)    GFR calc non Af Amer 30 (*)    GFR calc Af Amer 35 (*)    All other components within normal limits  PRO B NATRIURETIC PEPTIDE - Abnormal; Notable for the following:    Pro B Natriuretic peptide (BNP) 255.5 (*)    All other components within normal limits  I-STAT CG4 LACTIC ACID, ED   Results for orders placed during the hospital encounter of 03/20/14  CBC WITH DIFFERENTIAL      Result Value Ref Range   WBC 20.3 (*) 4.0 - 10.5 K/uL   RBC 3.44 (*) 3.87 - 5.11 MIL/uL   Hemoglobin 9.8 (*) 12.0 - 15.0 g/dL   HCT 28.4 (*) 36.0 - 46.0 %   MCV 82.6  78.0 - 100.0 fL   MCH 28.5  26.0 - 34.0 pg   MCHC 34.5  30.0 - 36.0 g/dL   RDW 15.9 (*) 11.5 - 15.5 %   Platelets 356  150 - 400 K/uL   Neutrophils Relative % 67  43 - 77 %   Neutro Abs 13.5 (*) 1.7 - 7.7 K/uL   Lymphocytes Relative 26  12 - 46 %   Lymphs Abs 5.2 (*) 0.7 - 4.0 K/uL   Monocytes Relative 7  3 - 12 %   Monocytes Absolute 1.4 (*) 0.1 - 1.0 K/uL   Eosinophils Relative 0  0 -  5 %   Eosinophils Absolute 0.1  0.0 - 0.7 K/uL   Basophils Relative 0  0 - 1 %   Basophils Absolute 0.0  0.0 - 0.1 K/uL  COMPREHENSIVE METABOLIC PANEL      Result Value Ref Range   Sodium 135 (*) 137 - 147 mEq/L   Potassium 2.5 (*) 3.7 - 5.3 mEq/L   Chloride 105  96 - 112 mEq/L   CO2 11 (*) 19 - 32 mEq/L   Glucose, Bld 79  70 - 99 mg/dL   BUN 19  6 - 23 mg/dL   Creatinine, Ser 1.70 (*) 0.50  - 1.10 mg/dL   Calcium 7.1 (*) 8.4 - 10.5 mg/dL   Total Protein 6.7  6.0 - 8.3 g/dL   Albumin 2.7 (*) 3.5 - 5.2 g/dL   AST 17  0 - 37 U/L   ALT 21  0 - 35 U/L   Alkaline Phosphatase 97  39 - 117 U/L   Total Bilirubin <0.2 (*) 0.3 - 1.2 mg/dL   GFR calc non Af Amer 30 (*) >90 mL/min   GFR calc Af Amer 35 (*) >90 mL/min  PRO B NATRIURETIC PEPTIDE      Result Value Ref Range   Pro B Natriuretic peptide (BNP) 255.5 (*) 0 - 125 pg/mL  I-STAT CG4 LACTIC ACID, ED      Result Value Ref Range   Lactic Acid, Venous 1.21  0.5 - 2.2 mmol/L     Imaging Review Dg Chest 2 View  03/20/2014   CLINICAL DATA:  Shortness of breath  EXAM: CHEST  2 VIEW  COMPARISON:  07/12/2013  FINDINGS: A large hiatal hernia is again identified. The cardiac shadow is stable. Old rib fractures are seen on the right. The lungs are clear. No focal infiltrate or sizable effusion is noted.  IMPRESSION: No active cardiopulmonary disease.   Electronically Signed   By: Inez Catalina M.D.   On: 03/20/2014 10:23     EKG Interpretation None      MDM   Final diagnoses:  Drug rash  Hypokalemia  Leukocytosis    Patient followed by the [facility]. Patient with history of presumed drug reaction the following Z-Pak for cold that was prescribed last week. Patient broke out with a rash initially had some swelling and some fever when the skin lesions started primary care doctor started her on prednisone made no difference. Patient is here because the rash has persisted and she is still feeling bad. The rash is itchy.  The rash was also treated with Lotrimin by her primary care Dr. also with no effect. Patient states has not been any pain associated with the rash. Not currently having any lip swelling tongue swelling or trouble breathing. Patient also has a history of Crohn's disease she has seen her GYN Dr. and her GI Dr. for this rash. There has been some mild shortness of breath. But no significant trouble with  breathing.    Mervin Kung, MD 03/20/14 1231  Addendum:. Had discussed originally with the hospitalist about admission they were concerned about Remo Lipps Johnson's 1 and patient transferred to [facility]. Family does not want to do that. Patient clinically if it is Remo Lipps Johnson's already 2 weeks into this rash some of the areas healing. Do not feel that admission is really for the Remo Lipps Johnson's if it is that but more so for the hypokalemia. They raise concerns about her blood pressure but had some erronous for readings but her  blood pressure transit all been above 90 systolic. Patient feels completely fine does not come across as toxic. Lactic acid is less than 2. Suspect that the potassium and the white count is elevated due to the prednisone course that she was on. We'll recontact the hospitalist for reconsideration for admission. Family wants to be admitted here.  Mervin Kung, MD 03/20/14 1311

## 2014-03-20 NOTE — ED Notes (Signed)
Pt states her PCP prescribed Z-pac for a cold last week.  Pt states that after starting it she broke out in a rash.  PCP prescribed cream which has not helped at all.  Pt states PCP also prescribed a suppository that she was unable to fill d/t the expense.  Pt with visible open rash to face and bilateral legs.  Pt states the rash is itchy.

## 2014-03-20 NOTE — ED Notes (Signed)
2.5 K called from lab; MD made aware.

## 2014-03-20 NOTE — ED Notes (Signed)
Pt denying lightheadedness or dizziness.

## 2014-03-21 DIAGNOSIS — L270 Generalized skin eruption due to drugs and medicaments taken internally: Secondary | ICD-10-CM | POA: Diagnosis not present

## 2014-03-21 DIAGNOSIS — N179 Acute kidney failure, unspecified: Secondary | ICD-10-CM

## 2014-03-21 DIAGNOSIS — E876 Hypokalemia: Secondary | ICD-10-CM | POA: Diagnosis not present

## 2014-03-21 LAB — COMPREHENSIVE METABOLIC PANEL
ALBUMIN: 2.3 g/dL — AB (ref 3.5–5.2)
ALT: 23 U/L (ref 0–35)
AST: 20 U/L (ref 0–37)
Alkaline Phosphatase: 87 U/L (ref 39–117)
BILIRUBIN TOTAL: 0.2 mg/dL — AB (ref 0.3–1.2)
BUN: 18 mg/dL (ref 6–23)
CO2: 10 mEq/L — CL (ref 19–32)
Calcium: 6.9 mg/dL — ABNORMAL LOW (ref 8.4–10.5)
Chloride: 114 mEq/L — ABNORMAL HIGH (ref 96–112)
Creatinine, Ser: 1.37 mg/dL — ABNORMAL HIGH (ref 0.50–1.10)
GFR calc non Af Amer: 39 mL/min — ABNORMAL LOW (ref >90)
GFR, EST AFRICAN AMERICAN: 45 mL/min — AB (ref >90)
Glucose, Bld: 72 mg/dL (ref 70–99)
Potassium: 2.9 mEq/L — CL (ref 3.7–5.3)
Sodium: 142 mEq/L (ref 137–147)
TOTAL PROTEIN: 5.8 g/dL — AB (ref 6.0–8.3)

## 2014-03-21 LAB — CBC
HCT: 28.4 % — ABNORMAL LOW (ref 36.0–46.0)
HEMOGLOBIN: 9.8 g/dL — AB (ref 12.0–15.0)
MCH: 28.7 pg (ref 26.0–34.0)
MCHC: 34.5 g/dL (ref 30.0–36.0)
MCV: 83 fL (ref 78.0–100.0)
Platelets: 323 10*3/uL (ref 150–400)
RBC: 3.42 MIL/uL — ABNORMAL LOW (ref 3.87–5.11)
RDW: 16.1 % — ABNORMAL HIGH (ref 11.5–15.5)
WBC: 13.1 10*3/uL — AB (ref 4.0–10.5)

## 2014-03-21 MED ORDER — POTASSIUM CHLORIDE CRYS ER 20 MEQ PO TBCR
40.0000 meq | EXTENDED_RELEASE_TABLET | ORAL | Status: AC
  Administered 2014-03-21 (×2): 40 meq via ORAL
  Filled 2014-03-21 (×2): qty 2

## 2014-03-21 MED ORDER — MAGNESIUM SULFATE 4000MG/100ML IJ SOLN
4.0000 g | Freq: Once | INTRAMUSCULAR | Status: AC
  Administered 2014-03-21: 4 g via INTRAVENOUS
  Filled 2014-03-21: qty 100

## 2014-03-21 NOTE — Progress Notes (Signed)
INITIAL NUTRITION ASSESSMENT  DOCUMENTATION CODES Per approved criteria  -Not Applicable   INTERVENTION: RD to continue to follow nutrition care plan and add interventions accordingly. Declined nutrition interventions at this time.  NUTRITION DIAGNOSIS: Inadequate oral intake related to variable appetite as evidenced by pt report.   Goal: Intake to meet >90% of estimated nutrition needs.  Monitor:  weight trends, lab trends, I/O's, PO intake, supplement tolerance  Reason for Assessment: Malnutrition Screening Tool  68 y.o. female  Admitting Dx: Skin lesion  ASSESSMENT: PMHx significant for Crohns disease. Admitted with rash and swelling s/p z-pak prescribed for URI. Pt also with skin lesions. Work-up reveals possible drug reaction.  Currently ordered for a Heart Healthy diet. She is eating 100% of meals, however she reports that the food here is "sorry." States that her appetite is good.  Pt asking to speak to the doctor during my interview, providing limited responses to my questions. Per EPIC weight hx, pt with variable weight hx. RD unable to complete physical exam during this visit.  Potassium is low at 2.9 --> ordered for KCl Magnesium is low at 1.0  Height: Ht Readings from Last 1 Encounters:  03/20/14 5\' 4"  (1.626 m)    Weight: Wt Readings from Last 1 Encounters:  03/20/14 125 lb (56.7 kg)    Ideal Body Weight: 120 lb  % Ideal Body Weight: 104%  Wt Readings from Last 10 Encounters:  03/20/14 125 lb (56.7 kg)  03/14/14 128 lb (58.06 kg)  02/06/14 136 lb (61.689 kg)  11/13/13 138 lb 8 oz (62.823 kg)  11/06/13 141 lb 1.6 oz (64.003 kg)  09/18/13 137 lb 12.8 oz (62.506 kg)  09/04/13 134 lb 11.2 oz (61.1 kg)  08/22/13 128 lb 14.4 oz (58.469 kg)  07/31/13 128 lb 6 oz (58.231 kg)  07/15/13 148 lb 3.2 oz (67.223 kg)    Usual Body Weight: 128 - 135 lb  % Usual Body Weight: 92-97%  BMI:  Body mass index is 21.45 kg/(m^2). WNL  Estimated Nutritional  Needs: Kcal: 1600 - 1800 Protein: 70 - 85 g Fluid: 1.6 - 1.8 liters daily  Skin: rash/welts  Diet Order: Cardiac  EDUCATION NEEDS: -No education needs identified at this time   Intake/Output Summary (Last 24 hours) at 03/21/14 1215 Last data filed at 03/21/14 0900  Gross per 24 hour  Intake 1218.75 ml  Output      0 ml  Net 1218.75 ml    Last BM: 4/14  Labs:   Recent Labs Lab 03/20/14 1052 03/20/14 1430 03/21/14 0505  NA 135*  --  142  K 2.5*  --  2.9*  CL 105  --  114*  CO2 11*  --  10*  BUN 19  --  18  CREATININE 1.70*  --  1.37*  CALCIUM 7.1*  --  6.9*  MG  --  1.0*  --   GLUCOSE 79  --  72    CBG (last 3)  No results found for this basename: GLUCAP,  in the last 72 hours  Scheduled Meds: . colchicine  0.6 mg Oral Daily  . dicyclomine  20 mg Oral BID AC  . heparin  5,000 Units Subcutaneous 3 times per day  . ondansetron  4 mg Oral BID  . pantoprazole  40 mg Oral BID AC  . potassium chloride  10 mEq Intravenous Once  . potassium chloride  40 mEq Oral Q4H  . sodium chloride  3 mL Intravenous Q12H  Continuous Infusions: . sodium chloride 1,000 mL (03/21/14 4235)    Past Medical History  Diagnosis Date  . Crohn's disease   . Chronic diarrhea   . Joint pain   . GERD (gastroesophageal reflux disease)   . Polyarthralgia   . Hiatal hernia   . Hemorrhoids   . Anemia   . Hernia (acquired) (recurrent)     hiatal  . Pneumonia 2014  . Exertional shortness of breath     "q once in awhile" (08/22/2013)  . Arthritis     "all over" (08/22/2013)  . Gout     "have had it in both hands; left hand is the worse" (08/22/2013)    Past Surgical History  Procedure Laterality Date  . Bowel resection  2009  . 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 Anal stenosis  ~ 2010  . Colonoscopy  2013  . Cholecystectomy  1990's  . Appendectomy  1980's  . Goiter  1999  . Esophagogastroduodenoscopy  11/11/2011    Procedure: ESOPHAGOGASTRODUODENOSCOPY (EGD);  Surgeon: Missy Sabins, MD;  Location: [facility] ENDOSCOPY;  Service: Endoscopy;  Laterality: N/A;  . Flexible sigmoidoscopy  11/11/2011    Procedure: FLEXIBLE SIGMOIDOSCOPY;  Surgeon: Missy Sabins, MD;  Location: [place];  Service: Endoscopy;  Laterality: N/A;  . Esophagogastroduodenoscopy  05/18/2012    Procedure: ESOPHAGOGASTRODUODENOSCOPY (EGD);  Surgeon: Rogene Houston, MD;  Location: [facility];  Service: Endoscopy;  Laterality: N/A;  200  . Colostomy takedown  2009    "only had it for about 1 month" (08/22/2013)  . Tonsillectomy      Inda Coke MS, RD, LDN Inpatient Registered Dietitian Pager: [phone] After-hours pager: [phone]

## 2014-03-21 NOTE — Progress Notes (Signed)
[facility] PROGRESS NOTE  Danielle [name] WGY:[id number] DOB: 07/13/1946 DOA: 03/20/2014 PCP: Maggie Font, MD  Assessment/Plan: Skin lesion/ ? Drug reaction- appeared after azithromycin, does not appear to be disseminated herpes but will get viral swab. ? Skin manifestation of Crohn's- does not appear to be this either.  -Continue supportive care for now -She states she has an appointment to see a dermatologist at [facility] on Friday, she prefers to be discharged tomorrow when her family and can be able to help with transportation Hypokalemia- replete k, also replete magnesium  hypomagnesemia-replete magnesium Leukocytosis- ? Related to steroids, follow and recheck in a.m. Crohn's disease- on remicade- rash was here before last dose Acute on chronic Renal insufficiency-improved with hydration, follow recheck.   Code Status: Full code Family Communication: None at bedside Disposition Plan: To home when medically ready   Consultants:  None  Procedures:  None  Antibiotics:  None  HPI/Subjective: States she had slight pruritus and facial lesions only overnight relieved with Benadryl. Denies shortness of breath also denies any swelling of her lips at this time.   Objective: Filed Vitals:   03/21/14 1701  BP: 93/63  Pulse: 101  Temp: 98.2 F (36.8 C)  Resp: 18    Intake/Output Summary (Last 24 hours) at 03/21/14 1706 Last data filed at 03/21/14 1300  Gross per 24 hour  Intake 1578.75 ml  Output      0 ml  Net 1578.75 ml   Filed Weights   03/20/14 1616 03/20/14 2043  Weight: 56.7 kg (125 lb) 56.7 kg (125 lb)    Exam:  General: alert & oriented x 3In NAD HEENT: Scattered Excoriated Erythematous patches on both sides of face Cardiovascular: RRR, nl S1 s2 Respiratory: CTAB Abdomen: soft +BS NT/ND, no masses palpable Extremities: Both upper and lower extremities with erythematous maculopapular patches(lesions larger in lower extremities) both anteriorly and  posteriorly on lower extremities    Data Reviewed: Basic Metabolic Panel:  Recent Labs Lab 03/20/14 1052 03/20/14 1430 03/21/14 0505  NA 135*  --  142  K 2.5*  --  2.9*  CL 105  --  114*  CO2 11*  --  10*  GLUCOSE 79  --  72  BUN 19  --  18  CREATININE 1.70*  --  1.37*  CALCIUM 7.1*  --  6.9*  MG  --  1.0*  --    Liver Function Tests:  Recent Labs Lab 03/20/14 1052 03/21/14 0505  AST 17 20  ALT 21 23  ALKPHOS 97 87  BILITOT <0.2* 0.2*  PROT 6.7 5.8*  ALBUMIN 2.7* 2.3*   No results found for this basename: LIPASE, AMYLASE,  in the last 168 hours No results found for this basename: AMMONIA,  in the last 168 hours CBC:  Recent Labs Lab 03/20/14 1052 03/21/14 0505  WBC 20.3* 13.1*  NEUTROABS 13.5*  --   HGB 9.8* 9.8*  HCT 28.4* 28.4*  MCV 82.6 83.0  PLT 356 323   Cardiac Enzymes: No results found for this basename: CKTOTAL, CKMB, CKMBINDEX, TROPONINI,  in the last 168 hours BNP (last 3 results)  Recent Labs  04/28/13 2305 03/20/14 1052  PROBNP 215.7* 255.5*   CBG: No results found for this basename: GLUCAP,  in the last 168 hours  Recent Results (from the past 240 hour(s))  WOUND CULTURE     Status: None   Collection Time    03/20/14  8:17 PM      Result Value Ref Range  Status   Specimen Description WOUND LEFT LEG   Final   Special Requests NONE   Final   Gram Stain     Final   Value: NO WBC SEEN     NO SQUAMOUS EPITHELIAL CELLS SEEN     NO ORGANISMS SEEN     Performed at [facility]   Culture PENDING   Incomplete   Report Status PENDING   Incomplete  VIRAL CULTURE VIRC     Status: None   Collection Time    03/20/14  8:37 PM      Result Value Ref Range Status   Specimen Description NOSE   Final   Special Requests NONE   Final   Culture     Final   Value: Culture has been initiated.     Note:    The current Enterovirus culture system does not detect Enterovirus D68. If Enterovirus D68 is suspected, please call client services to  add the test for Enterovirus PCR (Test code [phone]).     Performed at [facility]   Report Status PENDING   Incomplete     Studies: Dg Chest 2 View  03/20/2014   CLINICAL DATA:  Shortness of breath  EXAM: CHEST  2 VIEW  COMPARISON:  07/12/2013  FINDINGS: A large hiatal hernia is again identified. The cardiac shadow is stable. Old rib fractures are seen on the right. The lungs are clear. No focal infiltrate or sizable effusion is noted.  IMPRESSION: No active cardiopulmonary disease.   Electronically Signed   By: Inez Catalina M.D.   On: 03/20/2014 10:23    Scheduled Meds: . colchicine  0.6 mg Oral Daily  . dicyclomine  20 mg Oral BID AC  . heparin  5,000 Units Subcutaneous 3 times per day  . ondansetron  4 mg Oral BID  . pantoprazole  40 mg Oral BID AC  . potassium chloride  10 mEq Intravenous Once  . sodium chloride  3 mL Intravenous Q12H   Continuous Infusions: . sodium chloride 1,000 mL (03/21/14 0828)    Principal Problem:   Skin lesion Active Problems:   Hypokalemia   Crohn's disease   Leukocytosis, unspecified    Time spent: [facility] Pager [phone]. If 7PM-7AM, please contact night-coverage at [email or web address], password [facility] 03/21/2014, 5:06 PM  LOS: 1 day

## 2014-03-22 DIAGNOSIS — L270 Generalized skin eruption due to drugs and medicaments taken internally: Secondary | ICD-10-CM | POA: Diagnosis not present

## 2014-03-22 DIAGNOSIS — E876 Hypokalemia: Secondary | ICD-10-CM | POA: Diagnosis not present

## 2014-03-22 DIAGNOSIS — N179 Acute kidney failure, unspecified: Secondary | ICD-10-CM | POA: Diagnosis not present

## 2014-03-22 LAB — BASIC METABOLIC PANEL
BUN: 14 mg/dL (ref 6–23)
CALCIUM: 7.2 mg/dL — AB (ref 8.4–10.5)
CHLORIDE: 120 meq/L — AB (ref 96–112)
CO2: 11 mEq/L — ABNORMAL LOW (ref 19–32)
CREATININE: 1.39 mg/dL — AB (ref 0.50–1.10)
GFR calc non Af Amer: 39 mL/min — ABNORMAL LOW (ref >90)
GFR, EST AFRICAN AMERICAN: 45 mL/min — AB (ref >90)
Glucose, Bld: 84 mg/dL (ref 70–99)
Potassium: 3.7 mEq/L (ref 3.7–5.3)
Sodium: 141 mEq/L (ref 137–147)

## 2014-03-22 LAB — MAGNESIUM: MAGNESIUM: 2.5 mg/dL (ref 1.5–2.5)

## 2014-03-22 LAB — CBC
HCT: 29.7 % — ABNORMAL LOW (ref 36.0–46.0)
Hemoglobin: 9.7 g/dL — ABNORMAL LOW (ref 12.0–15.0)
MCH: 27.6 pg (ref 26.0–34.0)
MCHC: 32.7 g/dL (ref 30.0–36.0)
MCV: 84.4 fL (ref 78.0–100.0)
PLATELETS: 310 10*3/uL (ref 150–400)
RBC: 3.52 MIL/uL — ABNORMAL LOW (ref 3.87–5.11)
RDW: 16.6 % — AB (ref 11.5–15.5)
WBC: 12.8 10*3/uL — AB (ref 4.0–10.5)

## 2014-03-22 MED ORDER — SPIRONOLACTONE 50 MG PO TABS: 50.0000 mg | ORAL_TABLET | Freq: Every day | ORAL | Status: DC

## 2014-03-22 MED ORDER — DIPHENHYDRAMINE HCL 25 MG PO CAPS: 25.0000 mg | ORAL_CAPSULE | Freq: Four times a day (QID) | ORAL | Status: DC | PRN

## 2014-03-22 MED ORDER — FLUCONAZOLE 150 MG PO TABS
150.0000 mg | ORAL_TABLET | Freq: Once | ORAL | Status: DC
  Filled 2014-03-22: qty 1

## 2014-03-22 NOTE — Progress Notes (Signed)
Discharge instructions reviewed with pt; allowing time for questions. Pt verbalized understanding. IV removed with issue. Tele monitor removed; CCMD called to alert of discharge. Pt will leave unit via wheelchair accompanied by volunteer.

## 2014-03-22 NOTE — Discharge Summary (Signed)
Physician Discharge Summary  Danielle [name]:[id number] DOB: Feb 02, 1946 DOA: 03/20/2014  PCP: Maggie Font, MD  Admit date: 03/20/2014 Discharge date: 03/22/2014  Time spent: <63minutes  Recommendations for Outpatient Follow-up:  Follow-up Information   Follow up with [facility], [place] K, MD. (in 1week, call for appt upon discharge)    Specialty:  Family Medicine   Contact information:   [place] [address] [phone]       Please follow up. (Dermatologist at [facility] as scheduled in am)        Discharge Diagnoses:  Principal Problem:   Skin lesion Active Problems:   Hypokalemia   Crohn's disease   Leukocytosis, unspecified   Discharge Condition: improved/stable  Diet recommendation: Regular  Filed Weights   03/20/14 1616 03/20/14 2043 03/21/14 2100  Weight: 56.7 kg (125 lb) 56.7 kg (125 lb) 62.234 kg (137 lb 3.2 oz)    History of present illness:  With a history of Crohn's disease, well controlled. About 2 weeks ago, patient had URI type symptoms and was prescribed a z pak. Patient then developed some swelling of her lips and red areas on her skin- face, legs, back- most of her front torso was not affected. Some of the areas started as blisters. She was then seen by her GI and GYN doctors- give a course of steroids as well as ointment (a lotrimin). Patient has been slowly improving. No involvement of her mouth or trouble swallowing. Lesions are not painful or itching patient has been applying pertroleum jelly to keep them from drying- denies scratching.  In the Er, patient was found to have a low K- her Cr was slightly elevated as well. Patient refused transfer to [facility] for Dermatology consult. CO2 was low as well on initial labs- ER was sending her home except her K came back too low. She has been unable to be referred to derm as none in this area will take her insurance. She was admitted for further evaluation and  management.     Hospital Course:  Skin lesion/ ? Drug reaction- appeared after azithromycin, does not appear to be disseminated herpes but will get viral swab. ? Skin manifestation of Crohn's- does not appear to be this either.  -Patient was managed supportively, she reported that since completing her prednisone prior to admission the rash actually seemed to be improving.  -She stated she was finally able to make an appointment to see a dermatologist at [facility] on Friday 4/17, and her leukocytosis improved to 12.1 prior to discharge -She was noted to have borderline blood pressures in the hospital and this was to be secondary to hypovolemia as she had reported diarrhea prior to admission and was also on spironolactone. She was hydrated with IV fluids and constructed to hold off aspirin Aldactone until followup with PCP for recheck of blood pressures.  Hypokalemia- likely secondary to GI losses, resolved. Her potassium was repleted in the hospital hypomagnesemia-resolved, her magnesium was repleted in the hospital Leukocytosis- likely secondary to steroids she was on prior to admission, improved on no antibiotics-WBC count of 0.8 prior to discharge. Crohn's disease- on remicade- rash was here before last dose  Acute on chronic Renal insufficiency-improved with hydration-creatinine 1.39 today prior to discharge..   Procedures:  none  Consultations:  none  Discharge Exam: Filed Vitals:   03/22/14 1000  BP: 90/62  Pulse: 82  Temp: 98.2 F (36.8 C)  Resp: 20  Exam:  General: alert & oriented x 3In NAD  HEENT: Scattered Excoriated Erythematous patches on both sides of face  Cardiovascular: RRR, nl S1 s2  Respiratory: CTAB  Abdomen: soft +BS NT/ND, no masses palpable  Extremities: Both upper and lower extremities with erythematous maculopapular patches(lesions larger in lower extremities) both anteriorly and posteriorly on lower extremities.rash spares palms and soles.   Discharge  Instructions You were cared for by a hospitalist during your hospital stay. If you have any questions about your discharge medications or the care you received while you were in the hospital after you are discharged, you can call the unit and asked to speak with the hospitalist on call if the hospitalist that took care of you is not available. Once you are discharged, your primary care physician will handle any further medical issues. Please note that NO REFILLS for any discharge medications will be authorized once you are discharged, as it is imperative that you return to your primary care physician (or establish a relationship with a primary care physician if you do not have one) for your aftercare needs so that they can reassess your need for medications and monitor your lab values.  Discharge Orders   Future Appointments Provider Department Dept Phone   05/09/2014 10:00 AM [facility] IV/Med Infusion Room [facility] [phone]   05/14/2014 10:00 AM Rogene Houston, MD [facility] [phone]   Future Orders Complete By Expires   Diet general  As directed    Increase activity slowly  As directed        Medication List    STOP taking these medications       potassium chloride SA 20 MEQ tablet  Commonly known as:  K-DUR,KLOR-CON     predniSONE 5 MG tablet  Commonly known as:  DELTASONE      TAKE these medications       albuterol 108 (90 BASE) MCG/ACT inhaler  Commonly known as:  PROVENTIL HFA;VENTOLIN HFA  Inhale 2 puffs into the lungs every 6 (six) hours as needed for wheezing.     alendronate 70 MG tablet  Commonly known as:  FOSAMAX  Take 1 tablet (70 mg total) by mouth every 7 (seven) days.     CALCIUM + D PO  Take 1 tablet by mouth daily.     COLCRYS 0.6 MG tablet  Generic drug:  colchicine  Take 0.6 mg by mouth daily.     dicyclomine 10 MG/5ML syrup  Commonly known as:  BENTYL  Take 20 mg by mouth 2 (two) times daily before a  meal.     dicyclomine 10 MG/5ML syrup  Commonly known as:  BENTYL  TAKE 2 TEASPOONSFUL TWICE A DAY BEFORE A MEAL     diphenhydrAMINE 25 mg capsule  Commonly known as:  BENADRYL  Take 1 capsule (25 mg total) by mouth every 6 (six) hours as needed for itching.     HYDROcodone-acetaminophen 7.5-325 MG per tablet  Commonly known as:  NORCO  Take 1 tablet by mouth every 6 (six) hours as needed for moderate pain.     multivitamin with minerals Tabs tablet  Take 1 tablet by mouth daily.     ondansetron 4 MG tablet  Commonly known as:  ZOFRAN  Take 1 tablet (4 mg total) by mouth 2 (two) times daily.     pantoprazole 40 MG tablet  Commonly known as:  PROTONIX  Take 1 tablet (40 mg total) by mouth 2 (two) times daily before a meal.     REMICADE  100 MG injection  Generic drug:  inFLIXimab  Inject 100 mg into the vein every 8 (eight) weeks.     Simethicone 180 MG Caps  Commonly known as:  PHAZYME  Take 1 capsule (180 mg total) by mouth 3 (three) times daily as needed.     spironolactone 50 MG tablet  Commonly known as:  ALDACTONE  Take 50 mg by mouth daily.     VITAMIN B-12 IJ  Inject 1 each as directed every 30 (thirty) days. Around the 1st week of each month     zolpidem 10 MG tablet  Commonly known as:  AMBIEN  Take 10 mg by mouth at bedtime as needed for sleep.       Allergies  Allergen Reactions  . Zithromax [Azithromycin] Swelling    Patient states that she has a swollen mouth/ blisters, yeast present mouth and vaginia.  . Aspirin Other (See Comments)    Cramps her stomach  . Ciprofloxacin   . Sulfa Antibiotics Nausea And Vomiting       Follow-up Information   Follow up with HILL,GERALD K, MD. (in 1-2weeks, call for appt upon discharge)    Specialty:  Family Medicine   Contact information:   [place] [address] [phone]       Please follow up. (Dermatologist at [facility] as scheduled in am)        The results of  significant diagnostics from this hospitalization (including imaging, microbiology, ancillary and laboratory) are listed below for reference.    Significant Diagnostic Studies: Dg Chest 2 View  03/20/2014   CLINICAL DATA:  Shortness of breath  EXAM: CHEST  2 VIEW  COMPARISON:  07/12/2013  FINDINGS: A large hiatal hernia is again identified. The cardiac shadow is stable. Old rib fractures are seen on the right. The lungs are clear. No focal infiltrate or sizable effusion is noted.  IMPRESSION: No active cardiopulmonary disease.   Electronically Signed   By: Inez Catalina M.D.   On: 03/20/2014 10:23    Microbiology: Recent Results (from the past 240 hour(s))  WOUND CULTURE     Status: None   Collection Time    03/20/14  8:17 PM      Result Value Ref Range Status   Specimen Description WOUND LEFT LEG   Final   Special Requests NONE   Final   Gram Stain     Final   Value: NO WBC SEEN     NO SQUAMOUS EPITHELIAL CELLS SEEN     NO ORGANISMS SEEN     Performed at [facility]   Culture     Final   Value: Culture reincubated for better growth     Performed at [facility]   Report Status PENDING   Incomplete  VIRAL CULTURE [place]     Status: None   Collection Time    03/20/14  8:37 PM      Result Value Ref Range Status   Specimen Description NOSE   Final   Special Requests NONE   Final   Culture     Final   Value: Culture has been initiated.     Note:    The current Enterovirus culture system does not detect Enterovirus D68. If Enterovirus D68 is suspected, please call client services to add the test for Enterovirus PCR (Test code [id number]).     Performed at [facility]   Report Status PENDING   Incomplete  Labs: Basic Metabolic Panel:  Recent Labs Lab 03/20/14 1052 03/20/14 1430 03/21/14 0505 03/22/14 0506  NA 135*  --  142 141  K 2.5*  --  2.9* 3.7  CL 105  --  114* 120*  CO2 11*  --  10* 11*  GLUCOSE 79  --  72 84  BUN 19  --  18 14  CREATININE  1.70*  --  1.37* 1.39*  CALCIUM 7.1*  --  6.9* 7.2*  MG  --  1.0*  --  2.5   Liver Function Tests:  Recent Labs Lab 03/20/14 1052 03/21/14 0505  AST 17 20  ALT 21 23  ALKPHOS 97 87  BILITOT <0.2* 0.2*  PROT 6.7 5.8*  ALBUMIN 2.7* 2.3*   No results found for this basename: LIPASE, AMYLASE,  in the last 168 hours No results found for this basename: AMMONIA,  in the last 168 hours CBC:  Recent Labs Lab 03/20/14 1052 03/21/14 0505 03/22/14 0506  WBC 20.3* 13.1* 12.8*  NEUTROABS 13.5*  --   --   HGB 9.8* 9.8* 9.7*  HCT 28.4* 28.4* 29.7*  MCV 82.6 83.0 84.4  PLT 356 323 310   Cardiac Enzymes: No results found for this basename: CKTOTAL, CKMB, CKMBINDEX, TROPONINI,  in the last 168 hours BNP: BNP (last 3 results)  Recent Labs  04/28/13 2305 03/20/14 1052  PROBNP 215.7* 255.5*   CBG: No results found for this basename: GLUCAP,  in the last 168 hours     Signed:  Ammiel Guiney C Aadhira Heffernan  [facility] 03/22/2014, 11:31 AM

## 2014-03-23 DIAGNOSIS — L270 Generalized skin eruption due to drugs and medicaments taken internally: Secondary | ICD-10-CM | POA: Diagnosis not present

## 2014-03-23 LAB — WOUND CULTURE: Gram Stain: NONE SEEN

## 2014-03-26 LAB — VIRAL CULTURE VIRC

## 2014-03-29 ENCOUNTER — Other Ambulatory Visit (INDEPENDENT_AMBULATORY_CARE_PROVIDER_SITE_OTHER): Payer: Self-pay | Admitting: Internal Medicine

## 2014-03-29 DIAGNOSIS — K219 Gastro-esophageal reflux disease without esophagitis: Secondary | ICD-10-CM

## 2014-03-29 MED ORDER — PANTOPRAZOLE SODIUM 40 MG PO TBEC: 40.0000 mg | DELAYED_RELEASE_TABLET | Freq: Two times a day (BID) | ORAL | Status: DC

## 2014-03-30 ENCOUNTER — Other Ambulatory Visit (INDEPENDENT_AMBULATORY_CARE_PROVIDER_SITE_OTHER): Payer: Self-pay | Admitting: Internal Medicine

## 2014-04-03 DIAGNOSIS — H2510 Age-related nuclear cataract, unspecified eye: Secondary | ICD-10-CM | POA: Diagnosis not present

## 2014-04-03 DIAGNOSIS — H35369 Drusen (degenerative) of macula, unspecified eye: Secondary | ICD-10-CM | POA: Diagnosis not present

## 2014-04-05 DIAGNOSIS — D510 Vitamin B12 deficiency anemia due to intrinsic factor deficiency: Secondary | ICD-10-CM | POA: Diagnosis not present

## 2014-04-06 ENCOUNTER — Other Ambulatory Visit (INDEPENDENT_AMBULATORY_CARE_PROVIDER_SITE_OTHER): Payer: Self-pay | Admitting: Internal Medicine

## 2014-04-22 ENCOUNTER — Other Ambulatory Visit (INDEPENDENT_AMBULATORY_CARE_PROVIDER_SITE_OTHER): Payer: Self-pay | Admitting: Internal Medicine

## 2014-04-27 DIAGNOSIS — L408 Other psoriasis: Secondary | ICD-10-CM | POA: Diagnosis not present

## 2014-04-27 DIAGNOSIS — L270 Generalized skin eruption due to drugs and medicaments taken internally: Secondary | ICD-10-CM | POA: Diagnosis not present

## 2014-04-27 DIAGNOSIS — L538 Other specified erythematous conditions: Secondary | ICD-10-CM | POA: Diagnosis not present

## 2014-05-09 ENCOUNTER — Encounter (HOSPITAL_COMMUNITY)
Admission: RE | Admit: 2014-05-09 | Discharge: 2014-05-09 | Disposition: A | Discharge status: Home / Self Care | Payer: Medicare Other | Source: Ambulatory Visit | Attending: Internal Medicine | Admitting: Internal Medicine

## 2014-05-09 NOTE — Progress Notes (Signed)
Pt no show for remicade infusion. Called pt at home. States she has to see dr first before getting any more meds.

## 2014-05-10 DIAGNOSIS — D649 Anemia, unspecified: Secondary | ICD-10-CM | POA: Diagnosis not present

## 2014-05-10 DIAGNOSIS — R609 Edema, unspecified: Secondary | ICD-10-CM | POA: Diagnosis not present

## 2014-05-10 DIAGNOSIS — L408 Other psoriasis: Secondary | ICD-10-CM | POA: Diagnosis not present

## 2014-05-12 IMAGING — CR DG CHEST 2V
2 series · 2 of 2 positions shown · non-contrast
Comparison: Chest radiograph from [DATE] to S 14

CLINICAL DATA: Chest pain and shortness of breath; lower back pain.

CHEST - 2 VIEW

[w chest pa *]
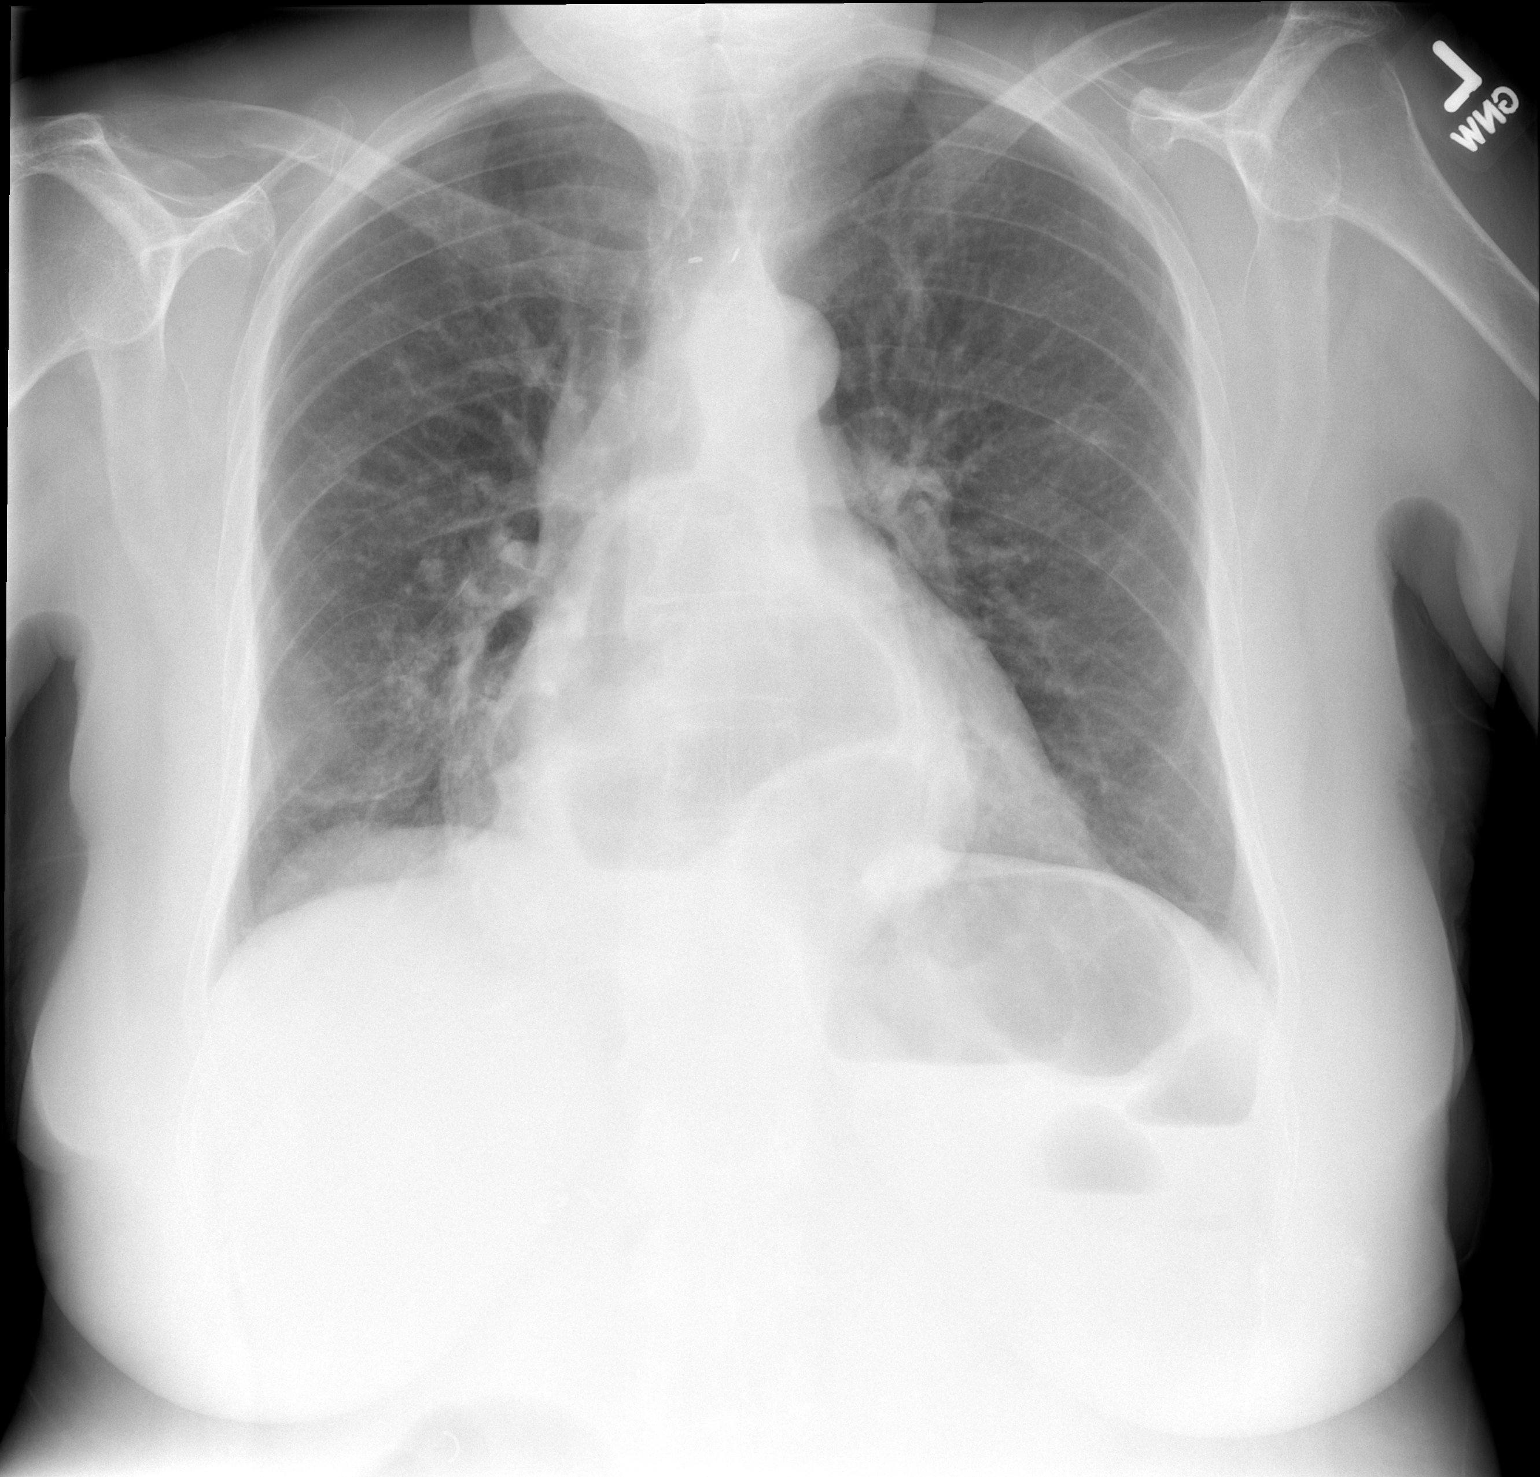

[w chest lat *]
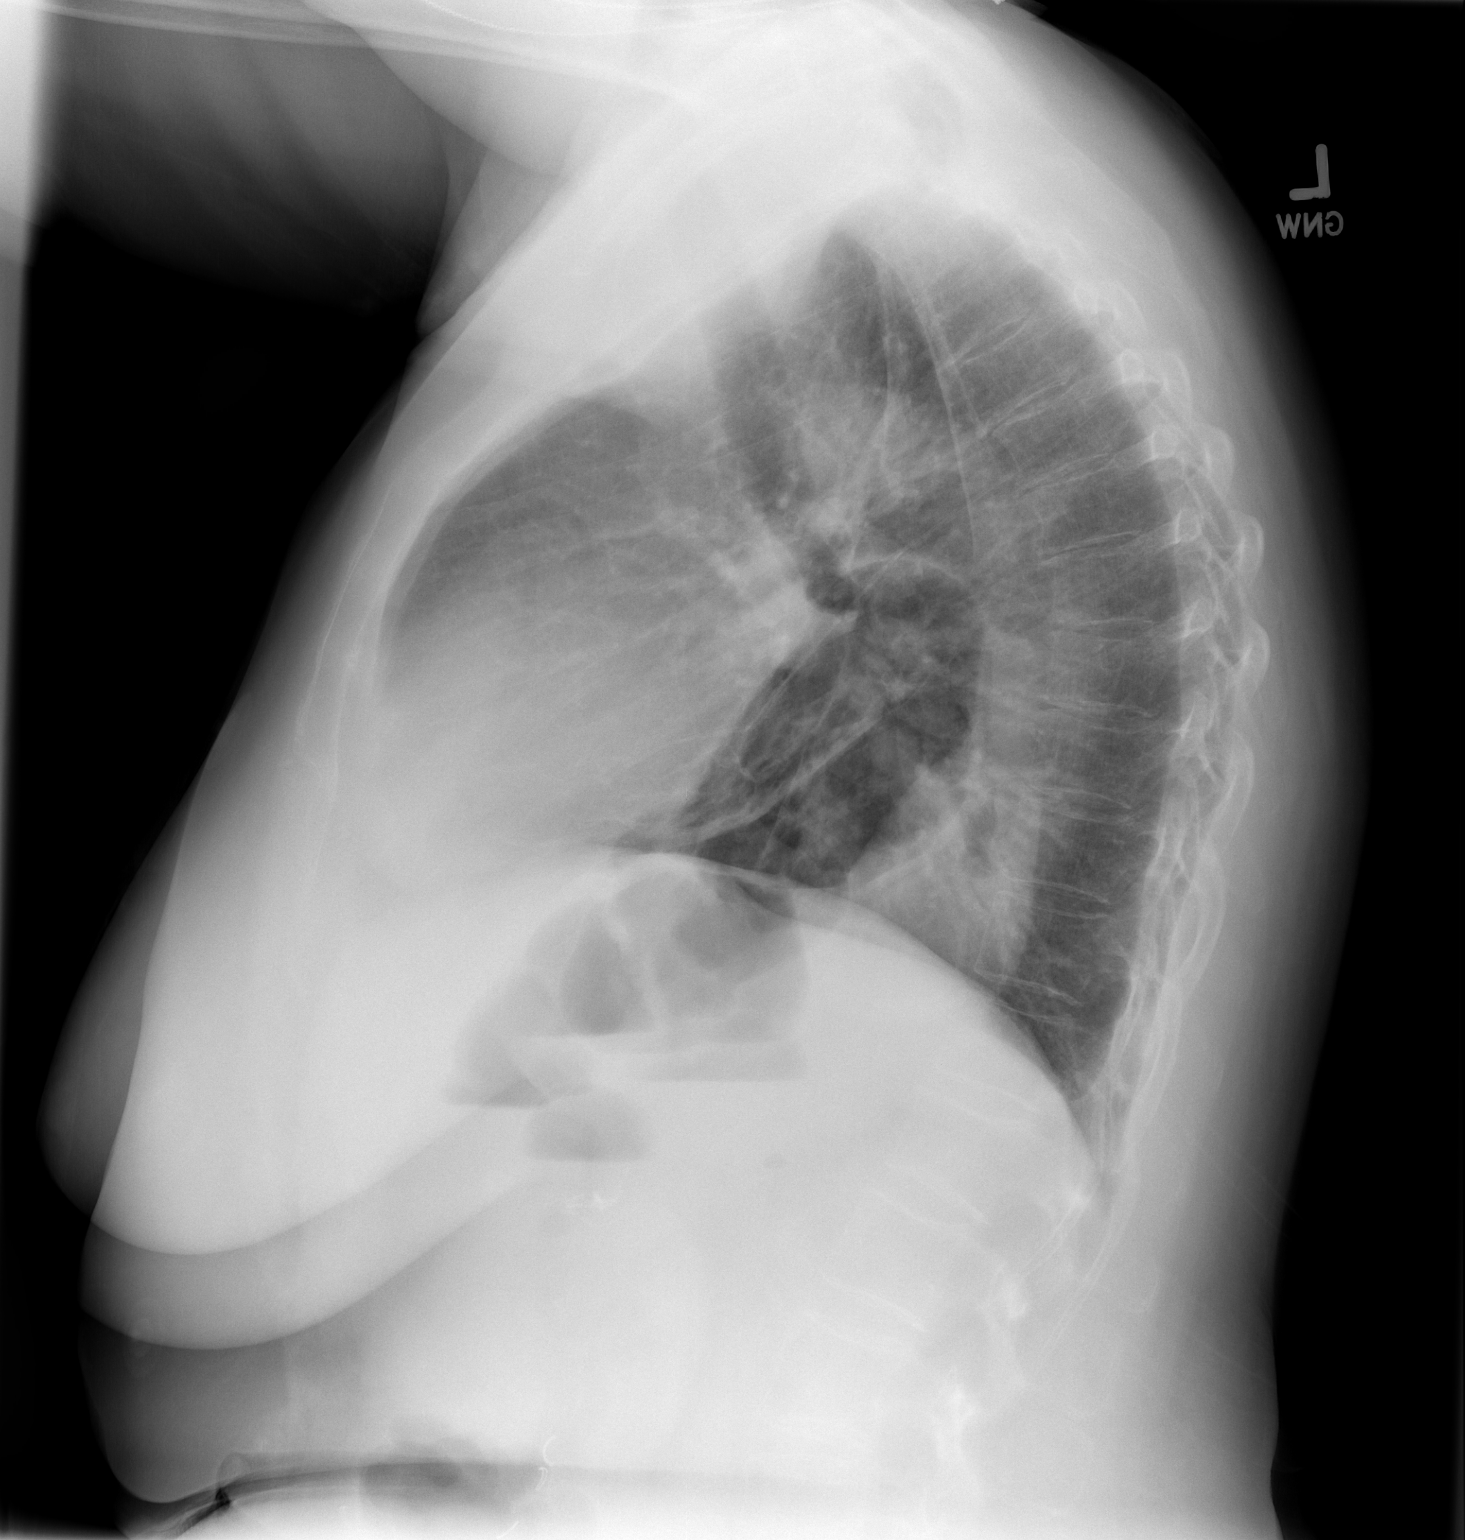

[2 of 2 positions shown; findings below may reference images not displayed]

FINDINGS: The lungs are well-aerated.  New right basilar airspace
opacification raises concern for mild pneumonia.  No definite
pleural effusion or pneumothorax is seen.  There may also be slight
left basilar airspace opacity.

The heart is normal in size; a large hiatal hernia is seen,
predominantly filled with air.  No acute osseous abnormalities are
seen.  Postoperative change is noted overlying the superior
mediastinum.  Clips are noted within the right upper quadrant,
reflecting prior cholecystectomy.
IMPRESSION: 1.  New right basilar airspace opacification, concerning for mild
pneumonia; question of mild left basilar airspace opacity.
2.  Large hiatal hernia seen.

## 2014-05-13 IMAGING — CR DG THORACIC SPINE 2V
3 series · 3 of 3 positions shown · non-contrast
Comparison: None.

CLINICAL DATA: Acute onset of back pain after working in the yard
yesterday.

THORACIC SPINE - 2 VIEW

[t thoracic spine ap]
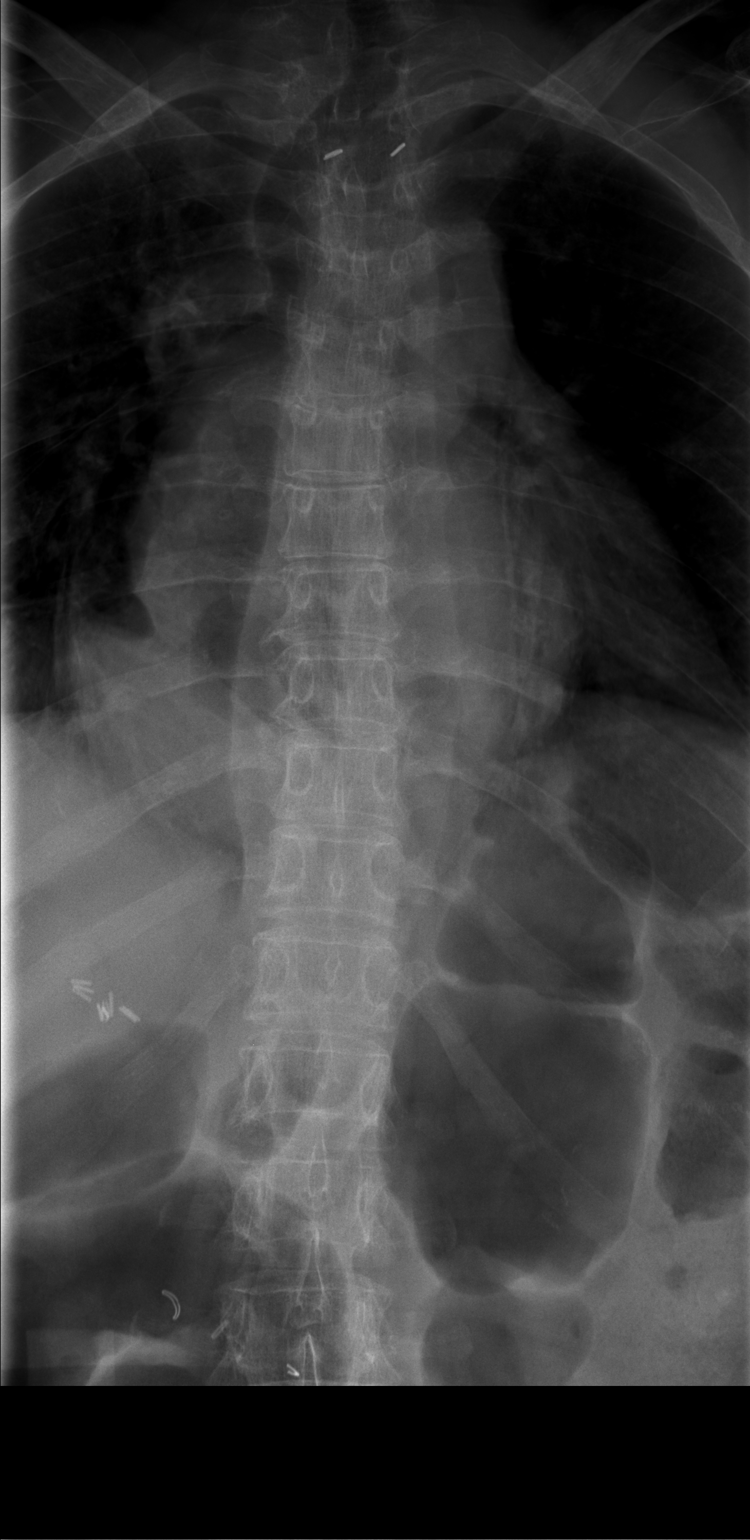

[t thoracic breathing lat]
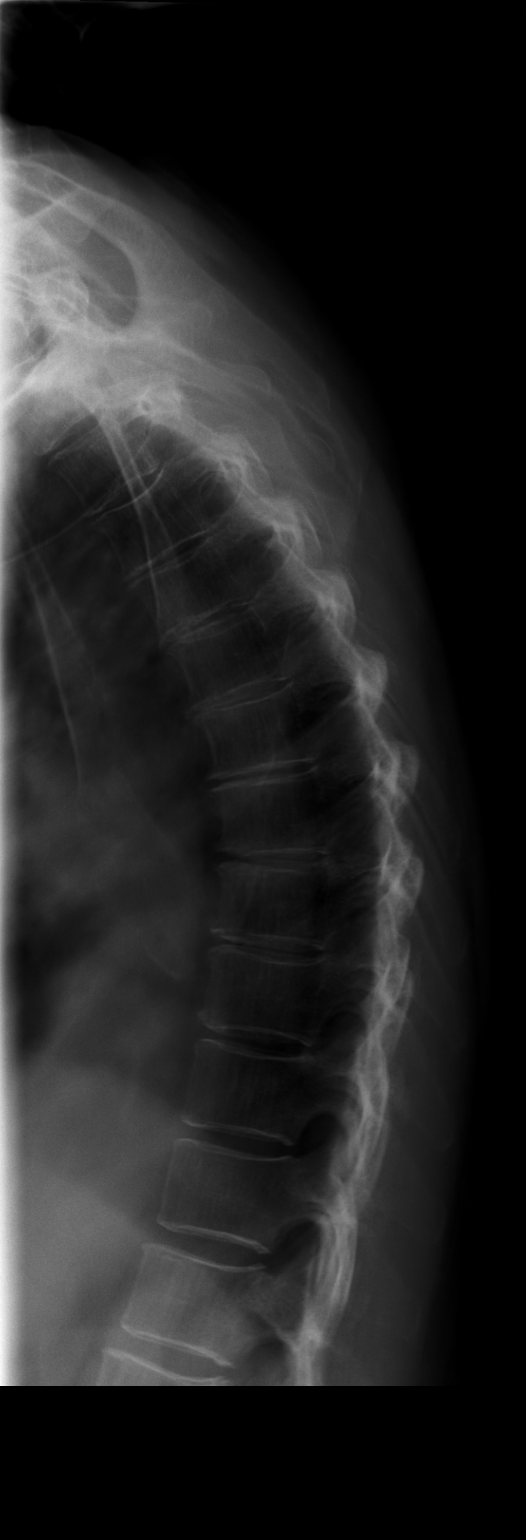

[t thoracic swimmers]
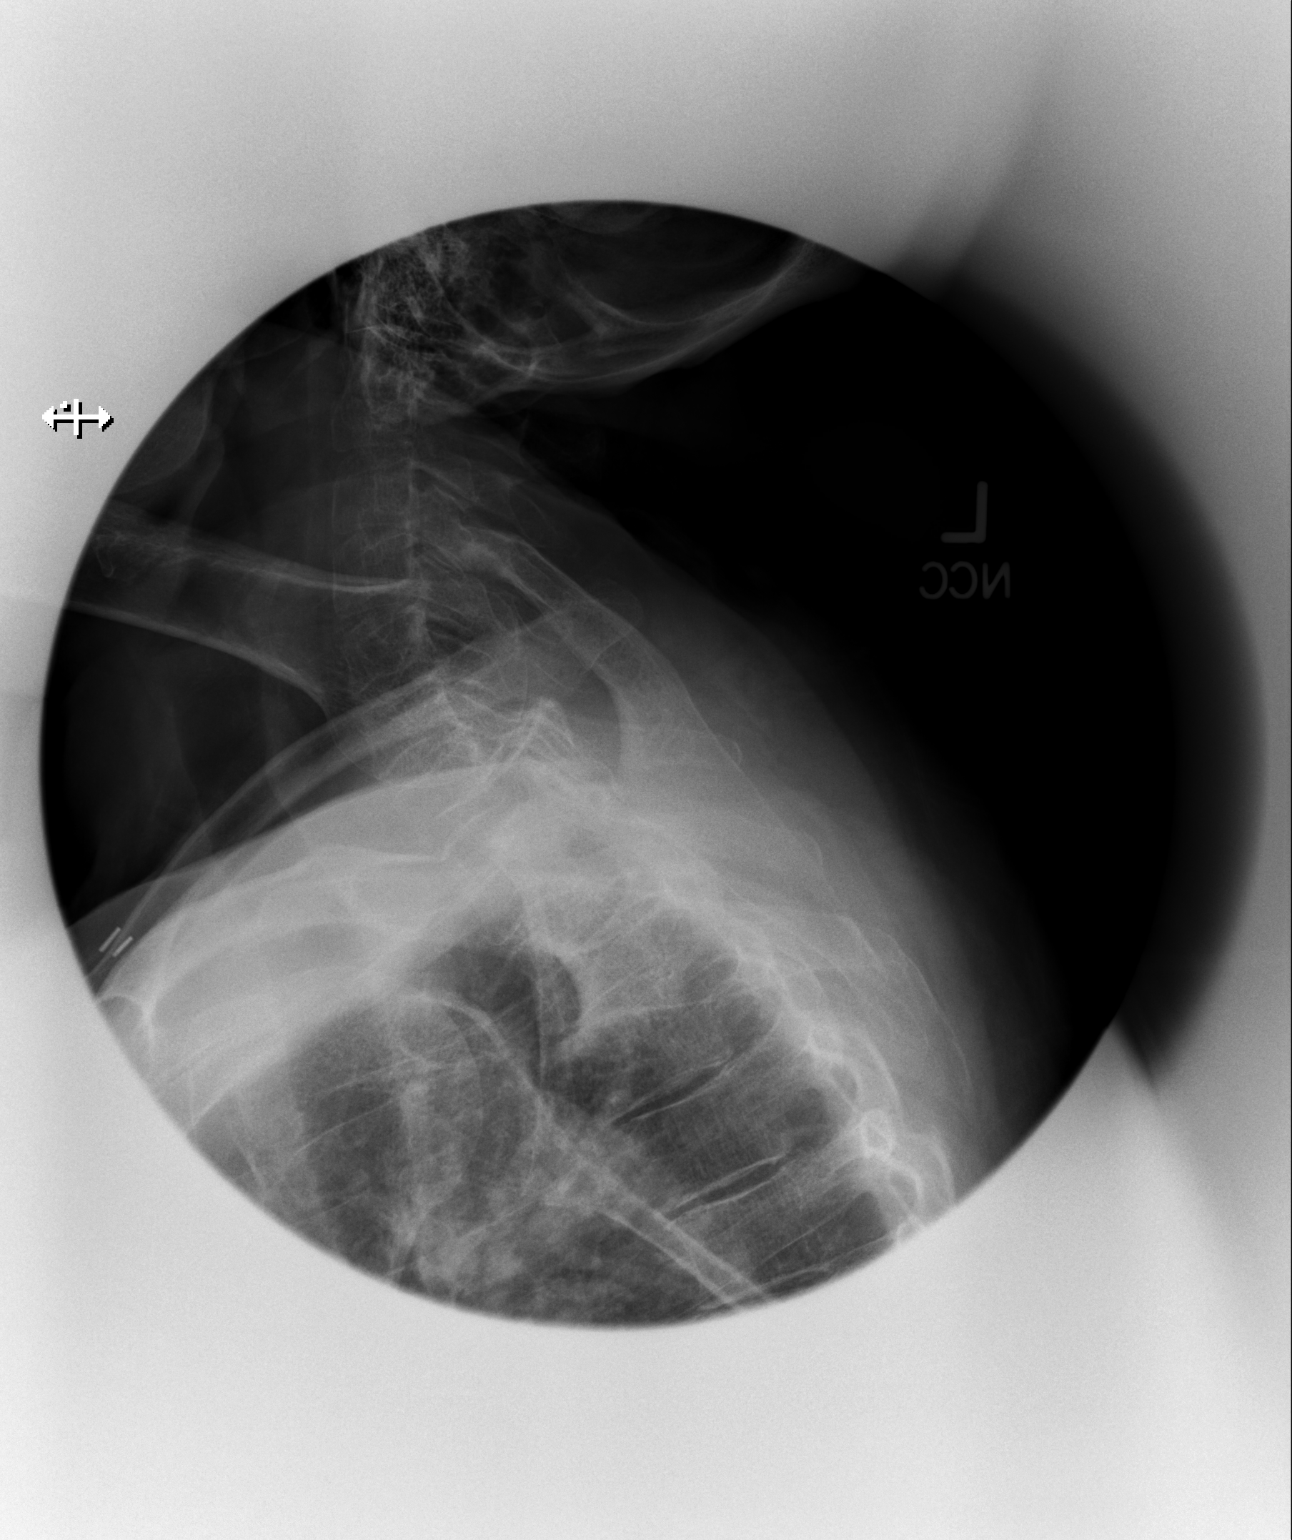

[3 of 3 positions shown; findings below may reference images not displayed]

FINDINGS: 12 rib-bearing thoracic vertebrae with anatomic
alignment.  No fractures.  Mild mid thoracic spondylosis.
Generalized osseous demineralization.  Intact pedicles.
IMPRESSION: No acute osseous abnormality.  Mild mid thoracic spondylosis.
Generalized osseous demineralization.

## 2014-05-13 IMAGING — CT CT CHEST W/O CM
2 of 3 series · 15 of 36 positions shown, 18 images · non-contrast
Comparison: Two-view chest x-ray yesterday.  No prior CT.

CLINICAL DATA: Evaluate basilar airspace disease on recent chest
imaging.

CT CHEST WITHOUT CONTRAST
TECHNIQUE: Multidetector CT imaging of the chest was performed
following the standard protocol without IV contrast.

[Series 2: routine chest · axial · 0.65mm/px · z∈[-283,-63]mm · 12 of 52 slices shown, 15 images]
[im 4/52  mediastinal]
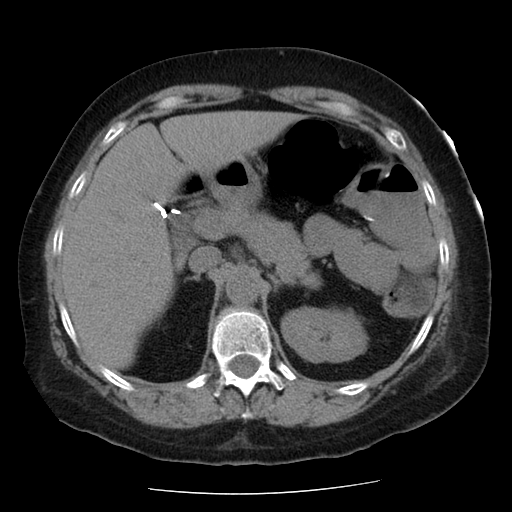
[im 4/52  lung]
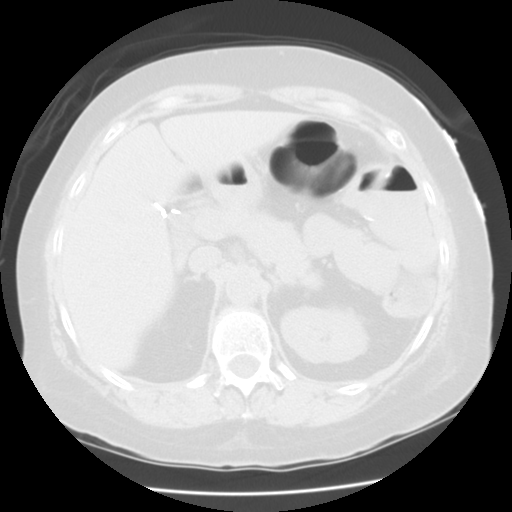
[im 8/52  lung]
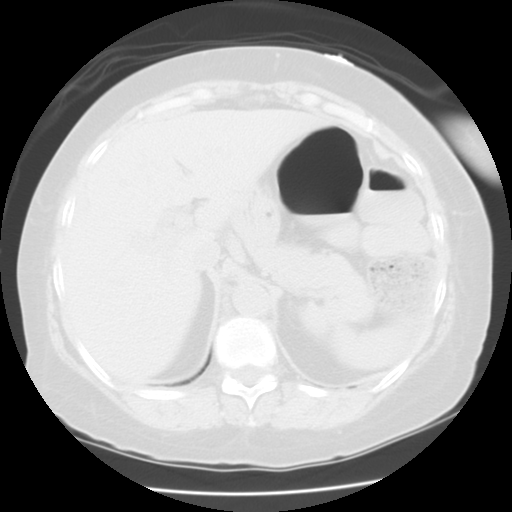
[im 12/52  lung]
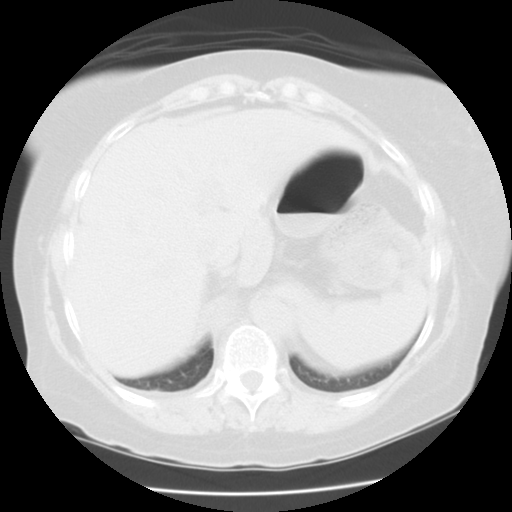
[im 16/52  lung]
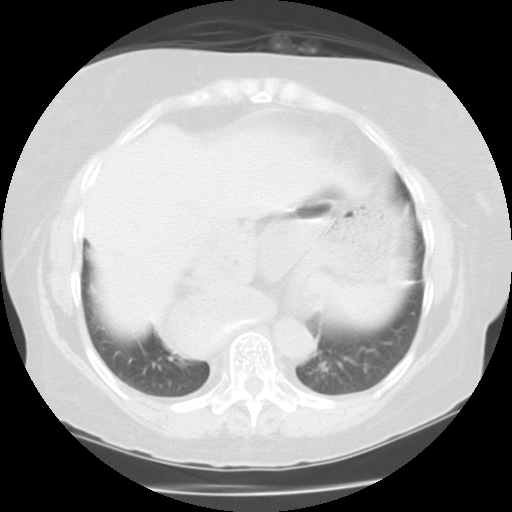
[im 19/52  mediastinal]
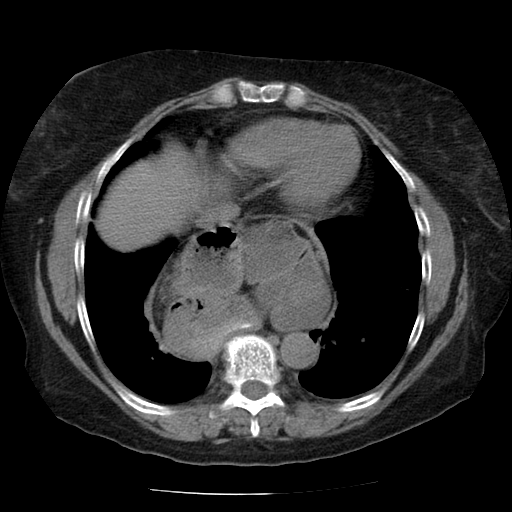
[im 19/52  lung]
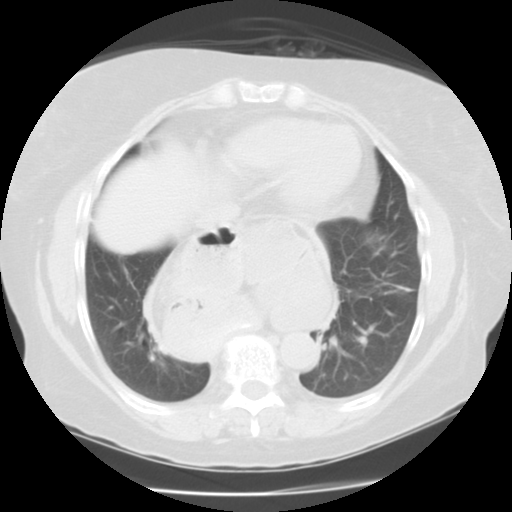
[im 23/52  lung]
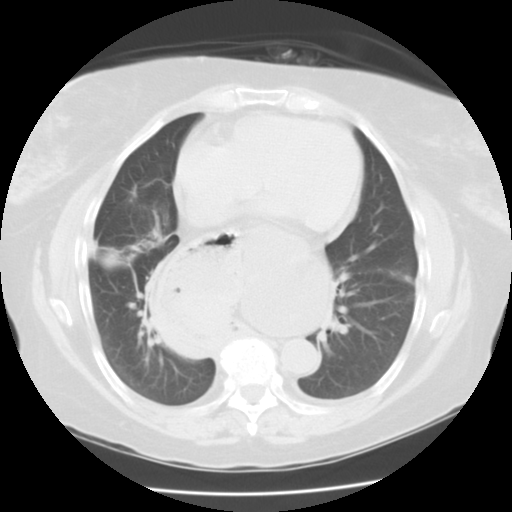
[im 29/52  lung]
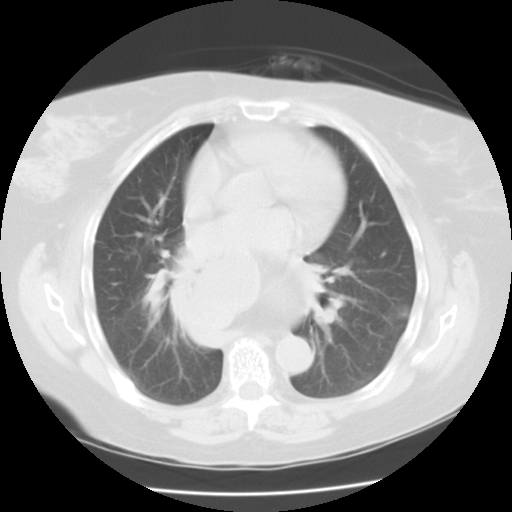
[im 33/52  lung]
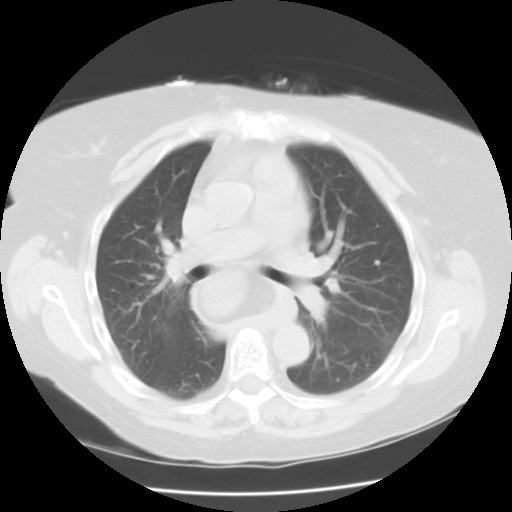
[im 36/52  mediastinal]
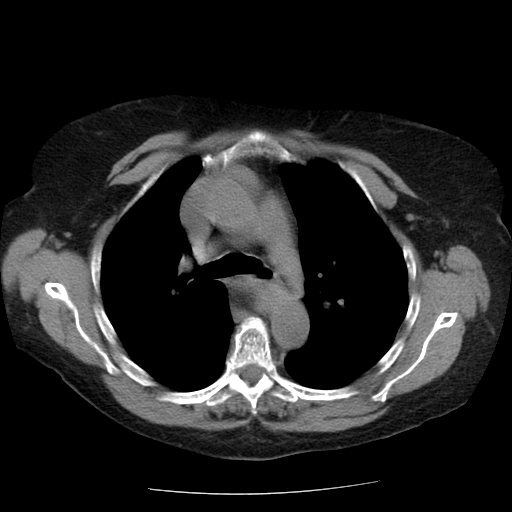
[im 36/52  lung]
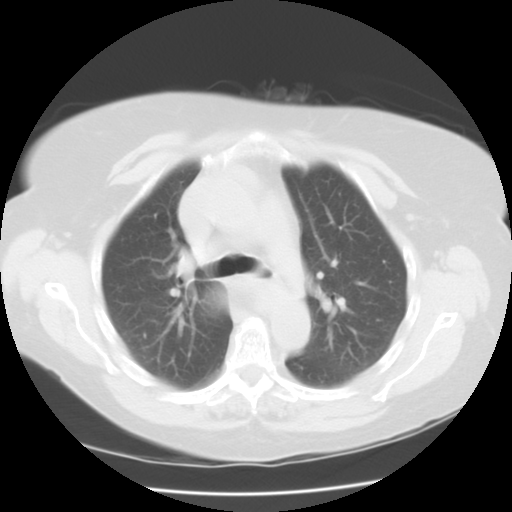
[im 40/52  lung]
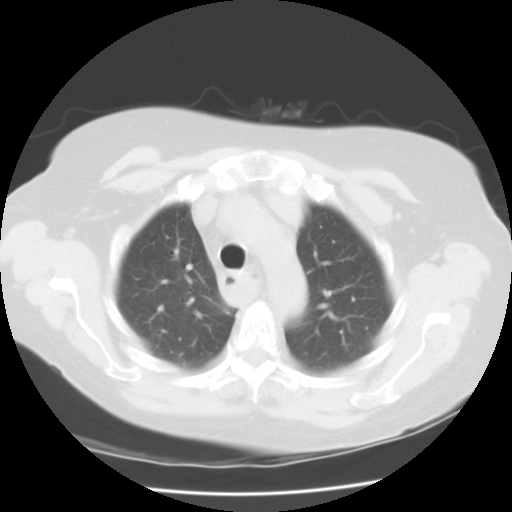
[im 44/52  lung]
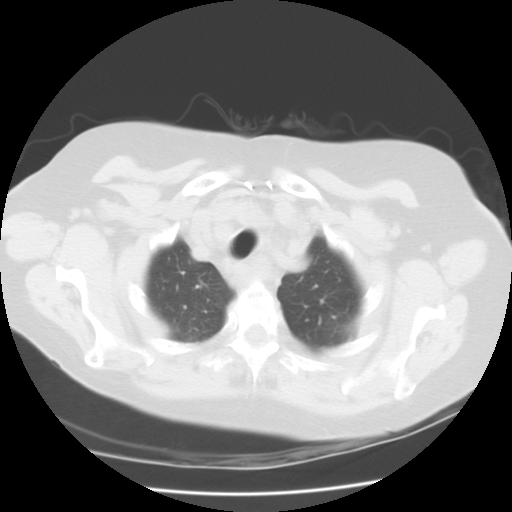
[im 48/52  lung]
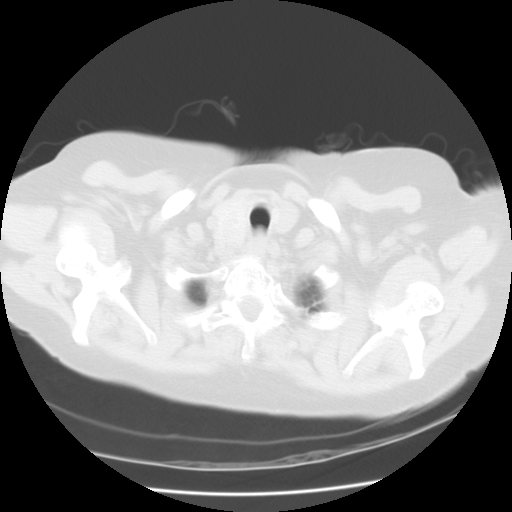

[Series 401: coronal chest · coronal · 0.65mm/px · 3 of 88 slices shown]
[im 18/88  lung]
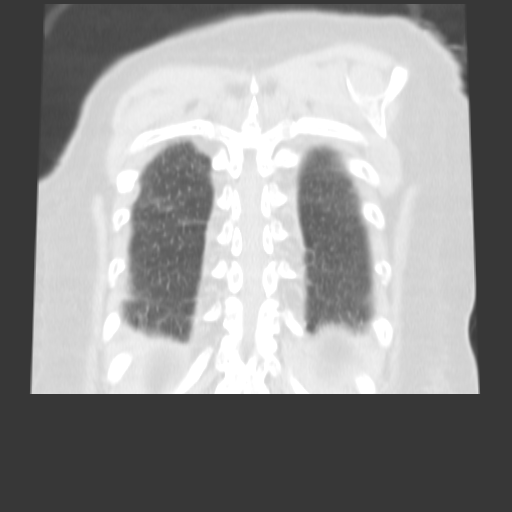
[im 35/88  lung]
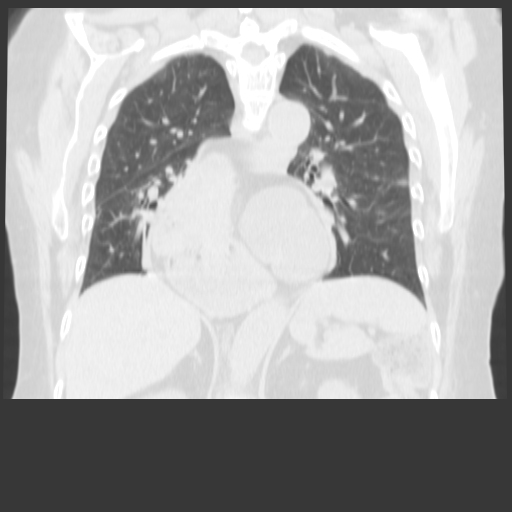
[im 53/88  lung]
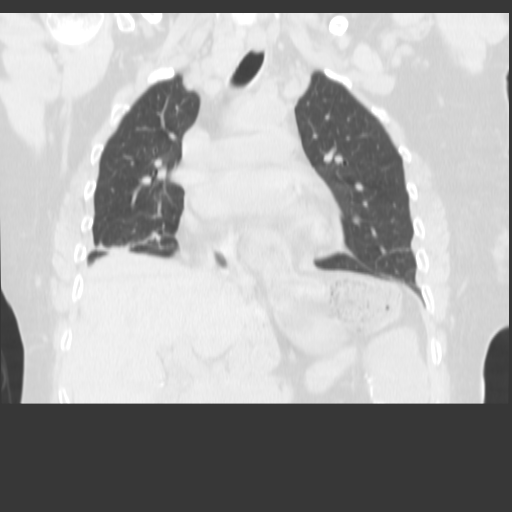

[15 of 36 positions shown; findings below may reference images not displayed]

FINDINGS: Atelectasis in the right lower lobe, right middle lobe,
and to a lesser degree left lower lobe, with crowding of the
bronchovascular markings.  This is due to the very large hiatal
hernia which will be described in detail below.  Pulmonary
parenchyma otherwise clear.  No pulmonary parenchymal nodules or
masses.  No pleural effusions.  Central airways patent without
significant bronchial wall thickening.

Heart mildly enlarged with left ventricular predominance.  No
visible coronary atherosclerosis.  No pericardial effusion.  Mild
atherosclerosis involving the aortic arch and the upper abdominal
aorta.

Marked enlargement of the right lobe of the thyroid gland which
contains multiple nodules, the largest approximating 1.3 - 1.4 cm.
No significant mediastinal, hilar, or axillary lymphadenopathy.

Very large hiatal hernia which contains at least [DATE] of the
stomach.  A portion of the mid transverse colon is also present in
the hernia.  Gallbladder surgically absent.  Remainder of the
visualized upper abdomen unremarkable for the unenhanced technique.
Bone window images demonstrate generalized osseous demineralization
and mild thoracic spondylosis.
IMPRESSION: 1.  Mild atelectasis in the lower lobes and right middle lobe, due
to the very large hiatal hernia.  No acute cardiopulmonary disease
otherwise.
2.  Very large hiatal hernia containing at least [DATE] of the stomach
and a portion of the mid transverse colon.
3.  Multinodular thyroid goiter.

## 2014-05-14 ENCOUNTER — Telehealth (INDEPENDENT_AMBULATORY_CARE_PROVIDER_SITE_OTHER): Payer: Self-pay | Admitting: *Deleted

## 2014-05-14 ENCOUNTER — Ambulatory Visit (INDEPENDENT_AMBULATORY_CARE_PROVIDER_SITE_OTHER): Payer: Medicare Other | Admitting: Internal Medicine

## 2014-05-14 NOTE — Telephone Encounter (Signed)
LM stating an emergency has came up and will need to cancel her apt for today, 05/14/14. Will call back to reschedule.

## 2014-05-15 DIAGNOSIS — K5010 Crohn's disease of large intestine without complications: Secondary | ICD-10-CM | POA: Diagnosis not present

## 2014-05-15 DIAGNOSIS — N824 Other female intestinal-genital tract fistulae: Secondary | ICD-10-CM | POA: Diagnosis not present

## 2014-05-17 ENCOUNTER — Other Ambulatory Visit (INDEPENDENT_AMBULATORY_CARE_PROVIDER_SITE_OTHER): Payer: Self-pay | Admitting: Internal Medicine

## 2014-05-17 ENCOUNTER — Telehealth (INDEPENDENT_AMBULATORY_CARE_PROVIDER_SITE_OTHER): Payer: Self-pay | Admitting: *Deleted

## 2014-05-17 NOTE — Telephone Encounter (Signed)
Per Dr.Rehman may fill with 5 additional refills. 

## 2014-05-17 NOTE — Telephone Encounter (Signed)
Forwarded to Dr.Rehman to make a decision. After he has made the decision ,patient will be contacted.

## 2014-05-17 NOTE — Telephone Encounter (Signed)
Will switch her to Humira Loading dose followed by maintenance

## 2014-05-17 NOTE — Telephone Encounter (Signed)
Danielle [name] said she still has the rash all over her body. The doctors at [facility] say not to take the remicade anymore that she will need to use Humira. The return phone number is [phone].

## 2014-05-18 NOTE — Telephone Encounter (Signed)
Patient called and made aware that we will begin a prior auth the week of 05/21/14. This is for Humira,

## 2014-05-20 ENCOUNTER — Other Ambulatory Visit (INDEPENDENT_AMBULATORY_CARE_PROVIDER_SITE_OTHER): Payer: Self-pay | Admitting: Internal Medicine

## 2014-05-21 NOTE — Telephone Encounter (Signed)
Per Deberah Castle, Np may fill with 3 refills.

## 2014-05-23 ENCOUNTER — Other Ambulatory Visit (INDEPENDENT_AMBULATORY_CARE_PROVIDER_SITE_OTHER): Payer: Self-pay | Admitting: Internal Medicine

## 2014-05-23 NOTE — Telephone Encounter (Signed)
Per Dr.Rehman may fill with 2 additional refills. 

## 2014-05-29 ENCOUNTER — Telehealth (INDEPENDENT_AMBULATORY_CARE_PROVIDER_SITE_OTHER): Payer: Self-pay | Admitting: *Deleted

## 2014-05-29 NOTE — Telephone Encounter (Signed)
Laurella would like for [name] to return the call at [phone]. If at all possible, before she leaves today.

## 2014-05-30 NOTE — Telephone Encounter (Signed)
Patient was called. Message left. I ask that she call our office back with the correct [facility] are to [facility] her Humira. There are 2 on file.

## 2014-05-30 NOTE — Telephone Encounter (Signed)
LM returning [name] call if she will please call her back.

## 2014-05-31 ENCOUNTER — Telehealth (INDEPENDENT_AMBULATORY_CARE_PROVIDER_SITE_OTHER): Payer: Self-pay | Admitting: *Deleted

## 2014-05-31 NOTE — Telephone Encounter (Signed)
New Pres[facility] was [facility] [address] , per Dr.Rehman's Instruction. Bentyl Syrup 10 gram/82ml Take 2 teaspoonfuls three times a day disp # 300 mL /5 refills  Patient was called and made aware.

## 2014-05-31 NOTE — Telephone Encounter (Signed)
Patient is currently taking Bentyl 2 teaspoonfuls twice daily. She states that she is taking it 2 teaspoonfuls three times a day. Change quanity Danielle [name] ask if Dr.Rehman would rewrite the prescription this way. She is running out to quick. States that it helps her .

## 2014-05-31 NOTE — Telephone Encounter (Signed)
Patient has been approved for the Humira. This is the preferred with Medicaid. A prescription for the Humira was [facility] [address], per Dr. Laural Golden. Humira 40 mg inject SQ as follows. Day 1 - Inject 80 mg Day 2- Inject 80 mg Day 14 - Inject 40 mg Day 15- Inject 40 mg Then inject 40 mg every 2 weeks.  Pharmacist will contact the patient. I am also going to call Yarisbel to make her aware of the status of medication.

## 2014-05-31 NOTE — Telephone Encounter (Signed)
Please send a new prescription.

## 2014-06-04 DIAGNOSIS — L408 Other psoriasis: Secondary | ICD-10-CM | POA: Diagnosis not present

## 2014-06-06 ENCOUNTER — Telehealth (INDEPENDENT_AMBULATORY_CARE_PROVIDER_SITE_OTHER): Payer: Self-pay | Admitting: *Deleted

## 2014-06-06 NOTE — Telephone Encounter (Signed)
Danielle [name] called Wednesday at 10:51 am  She needs her medical records for last year from Dr. Laural Golden.  Please give her a call if you have any questions

## 2014-06-11 NOTE — Telephone Encounter (Signed)
Records mailed to patient, she is aware

## 2014-06-13 ENCOUNTER — Other Ambulatory Visit (INDEPENDENT_AMBULATORY_CARE_PROVIDER_SITE_OTHER): Payer: Self-pay | Admitting: Internal Medicine

## 2014-06-13 DIAGNOSIS — R110 Nausea: Secondary | ICD-10-CM

## 2014-06-13 MED ORDER — ONDANSETRON HCL 4 MG PO TABS: ORAL_TABLET | ORAL | Status: DC

## 2014-06-19 ENCOUNTER — Other Ambulatory Visit (INDEPENDENT_AMBULATORY_CARE_PROVIDER_SITE_OTHER): Payer: Self-pay | Admitting: Internal Medicine

## 2014-06-19 NOTE — Telephone Encounter (Signed)
Per Dr.Rehman may refill with 2 refills

## 2014-06-20 ENCOUNTER — Other Ambulatory Visit (INDEPENDENT_AMBULATORY_CARE_PROVIDER_SITE_OTHER): Payer: Self-pay | Admitting: Internal Medicine

## 2014-06-20 DIAGNOSIS — N390 Urinary tract infection, site not specified: Secondary | ICD-10-CM | POA: Diagnosis not present

## 2014-06-20 DIAGNOSIS — K5090 Crohn's disease, unspecified, without complications: Secondary | ICD-10-CM | POA: Diagnosis not present

## 2014-06-26 ENCOUNTER — Ambulatory Visit (INDEPENDENT_AMBULATORY_CARE_PROVIDER_SITE_OTHER): Payer: Medicare Other

## 2014-06-26 ENCOUNTER — Telehealth (INDEPENDENT_AMBULATORY_CARE_PROVIDER_SITE_OTHER): Payer: Self-pay | Admitting: *Deleted

## 2014-06-26 NOTE — Telephone Encounter (Signed)
Patient presented to the office with her sister for the teaching /injection of Humira. Patient had previously been rec'ving  Remicade. At the time of her last infusion patient was also on a Z- Pac.. She developed a very bad rash. Ali Chukson sent her to [facility]. Dr.Fisher per the patient diagnosed her with a allergy reaction to both the Z- Pac and Remicade. Patient was also told by Dr.Fisher that once the Humira got into her system , the rash/psorosis would start to go away.  Patient was approved for Humira. She presented to the office today for teaching and injection. Patient still has a rash , edema. The rash looks very good compared to the pictures that her sister has . Afebrile and denies pain at the various sites. Vital signs are normal. Ht: 5 4.5 , Wt: 153.5 lbs , Temp : 98.0 , Pulse: 74 , Respiration :18 , B/P 110/80 Deberah Castle, NP looked at the rash , and agreed with me that Dr.Rehman should be contacted. Dr.Rehman will see patient on Thursday to evaluate the rash . This was explained to the patient and her sister , and both verbalized understanding. We will teach and if given clearance on Thursday , will start the Humira Injections.

## 2014-06-28 ENCOUNTER — Encounter (INDEPENDENT_AMBULATORY_CARE_PROVIDER_SITE_OTHER): Payer: Self-pay | Admitting: Internal Medicine

## 2014-06-28 ENCOUNTER — Ambulatory Visit (INDEPENDENT_AMBULATORY_CARE_PROVIDER_SITE_OTHER): Payer: Medicare Other | Admitting: Internal Medicine

## 2014-06-28 VITALS — BP 110/70 | HR 74 | Temp 98.2°F | Resp 18 | Ht 64.5 in | Wt 153.1 lb

## 2014-06-28 DIAGNOSIS — K612 Anorectal abscess: Secondary | ICD-10-CM

## 2014-06-28 DIAGNOSIS — N390 Urinary tract infection, site not specified: Secondary | ICD-10-CM

## 2014-06-28 DIAGNOSIS — K5090 Crohn's disease, unspecified, without complications: Secondary | ICD-10-CM | POA: Diagnosis not present

## 2014-06-28 DIAGNOSIS — L408 Other psoriasis: Secondary | ICD-10-CM | POA: Diagnosis not present

## 2014-06-28 DIAGNOSIS — L409 Psoriasis, unspecified: Secondary | ICD-10-CM

## 2014-06-28 DIAGNOSIS — K610 Anal abscess: Secondary | ICD-10-CM

## 2014-06-28 MED ORDER — NYSTATIN 100000 UNIT/GM EX CREA: 1.0000 "application " | TOPICAL_CREAM | Freq: Two times a day (BID) | CUTANEOUS | Status: DC

## 2014-06-28 MED ORDER — CIPROFLOXACIN HCL 500 MG PO TABS: 500.0000 mg | ORAL_TABLET | Freq: Two times a day (BID) | ORAL | Status: DC

## 2014-06-28 MED ORDER — TRIAMCINOLONE ACETONIDE 0.1 % EX CREA: 1.0000 "application " | TOPICAL_CREAM | Freq: Two times a day (BID) | CUTANEOUS | Status: DC

## 2014-06-28 NOTE — Progress Notes (Signed)
Presenting complaint;  For followup of Crohn's disease. Patient interested in starting Humira.  Subjective:   patient is 68 year old African female who has history of ileal and perianal Crohn's disease who is here for scheduled visit accompanied by her sister Cheri Rous. She was last seen in March 2015 when she was prescribed Zithromax for acute bronchitis.about 8 or 10 days later she developed generalized rash and responded to prednisone. A rash relapse off prednisone.she was referred to Dr. Beverly Gust, dermatologist at  [facility].skin biopsy confirmed that she has psoriasis. Patient is also advised to switch to Humira. Last dose of Remicade was on 01/10/2014.early on it was felt her rash was secondary to Remicade. Patient has filled prescription for Humira and wonders when she can start using this medication.in the meantime her rash got worse and Dr. Beverly Gust has put her back on prednisone at 60 mg daily and she is to taper dose every 3 day.she saw Dr. Berdine Addison and urinalysis was abnormal suggesting urinary tract infection.she was given prescription for Cipro which she has lost.she has gained 17 pounds since her last visit which she believes is due to prednisone use. She has noted perianal discomfort and discharge. She denies abdominal pain. She is having 3-4 soft stools daily. She denies abdominal pain melena rectal bleeding nausea or vomiting. Her heartburn is well-controlled with therapy. She denies flank pain or dysuria. She also denies fever.  Current Medications: Outpatient Encounter Prescriptions as of 06/28/2014  Medication Sig  . albuterol (PROVENTIL HFA;VENTOLIN HFA) 108 (90 BASE) MCG/ACT inhaler Inhale 2 puffs into the lungs every 6 (six) hours as needed for wheezing.  . Calcium Carbonate-Vitamin D (CALCIUM + D PO) Take 1 tablet by mouth daily.  . Cyanocobalamin (VITAMIN B-12 IJ) Inject 1 each as directed every 30 (thirty) days. Around the 1st week of each month  . dicyclomine (BENTYL) 10  MG/5ML syrup TAKE 2 TEASPOONSFUL TWICE A DAY BEFORE A MEAL  . dicyclomine (BENTYL) 10 MG/5ML syrup TAKE 2 TEASPOONFULS TWICE A DAY BEFORE A MEAL  . diphenhydrAMINE (BENADRYL) 25 mg capsule Take 1 capsule (25 mg total) by mouth every 6 (six) hours as needed for itching.  . diphenoxylate-atropine (LOMOTIL) 2.5-0.025 MG per tablet TAKE 1 TABLET BY MOUTH 3 TIMES A DAY AS NEEDED FOR DIARHEA OR LOOSE STOOLS.  . HYDROcodone-acetaminophen (NORCO) 7.5-325 MG per tablet Take 1 tablet by mouth every 6 (six) hours as needed for moderate pain.  [name] KLOR-CON M20 20 MEQ tablet TAKE 1 TABLET 3 TIMES A DAY  . Multiple Vitamin (MULTIVITAMIN WITH MINERALS) TABS Take 1 tablet by mouth daily.  . ondansetron (ZOFRAN) 4 MG tablet TAKE 1 TABLET (4 MG TOTAL) BY MOUTH 2 (TWO) TIMES DAILY.  . pantoprazole (PROTONIX) 40 MG tablet Take 1 tablet (40 mg total) by mouth 2 (two) times daily before a meal.  . predniSONE (DELTASONE) 10 MG tablet Take 60 mg by mouth daily with breakfast.  . Simethicone (PHAZYME) 180 MG CAPS Take 1 capsule (180 mg total) by mouth 3 (three) times daily as needed.  [name] spironolactone (ALDACTONE) 50 MG tablet TAKE 1 TABLET (50 MG TOTAL) BY MOUTH DAILY.  [name] zolpidem (AMBIEN) 10 MG tablet Take 10 mg by mouth at bedtime as needed for sleep.   . [DISCONTINUED] alendronate (FOSAMAX) 70 MG tablet Take 1 tablet (70 mg total) by mouth every 7 (seven) days.  . [DISCONTINUED] COLCRYS 0.6 MG tablet Take 0.6 mg by mouth daily.   . [DISCONTINUED] dicyclomine (BENTYL) 10 MG/5ML syrup Take 20 mg by  mouth 2 (two) times daily before a meal.  . [DISCONTINUED] inFLIXimab (REMICADE) 100 MG injection Inject 100 mg into the vein every 8 (eight) weeks.  . [DISCONTINUED] spironolactone (ALDACTONE) 50 MG tablet Take 1 tablet (50 mg total) by mouth daily.     Objective: Blood pressure 110/70, pulse 74, temperature 98.2 F (36.8 C), temperature source Oral, resp. rate 18, height 5' 4.5" (1.638 m), weight 153 lb 1.6 oz (69.446  kg).  patient is alert and in no acute distress. She has scaly erythematous rash over face for head and extremities. Conjunctiva is pink. Sclera is nonicteric Oropharyngeal mucosa is normal. No neck masses or thyromegaly noted. Cardiac exam with regular rhythm normal S1 and S2. No murmur or gallop noted. Lungs are clear to auscultation. Abdomen symmetrical soft and nontender without organomegaly or masses.   She has 1+ pitting edema around ankles. She has macular rash over both legs.  Labs/studies Results:    Assessment:  #1.Crohn's disease.ileal disease appeared to be quite sudden and she is having renal symptoms suggesting relapse. She needs to get back on him here as soon as feasible so that she would not have full-blown relapse. Urinary tract infection needs to be eradicated before Humira begun. #2. Skin rash. Biopsy consistent with psoriasis. Initially rash thought to be due to Zithromax. Review of her records reveals that she was treated with Zithromax in January 2014 and did not experience any side effects. The marrow hopefully will also take care of psoriasis. #3. Urinary tract infection. Diagnosis made by Dr. Berdine Addison and patient has lost prescription   Plan;  Prescription given for Cipro 500 mg by mouth twice a day for 7 days. Apply combination of Mycostatin and troglitazone cream to perianal area twice a day until symptoms resolve. Prescription given for both of these medications. Patient will begin him here early next week. Teaching provided by Ms. Thomas Hoff, LPN. Patient will call with progress report in 3 weeks x4. Office visit in 2 months.

## 2014-06-28 NOTE — Patient Instructions (Signed)
Can start Humira early next week when urinary symptoms have cleared. Call with progress report every week x4

## 2014-07-08 ENCOUNTER — Encounter (HOSPITAL_COMMUNITY): Payer: Self-pay | Admitting: Emergency Medicine

## 2014-07-08 ENCOUNTER — Other Ambulatory Visit: Payer: Self-pay

## 2014-07-08 ENCOUNTER — Emergency Department (HOSPITAL_COMMUNITY): Payer: Medicare Other

## 2014-07-08 ENCOUNTER — Inpatient Hospital Stay (HOSPITAL_COMMUNITY)
Admission: EM | Admit: 2014-07-08 | Discharge: 2014-07-10 | DRG: 287 | Disposition: A | Discharge status: Home / Self Care | Payer: Medicare Other | Attending: Cardiology | Admitting: Cardiology

## 2014-07-08 DIAGNOSIS — Z87891 Personal history of nicotine dependence: Secondary | ICD-10-CM

## 2014-07-08 DIAGNOSIS — I2510 Atherosclerotic heart disease of native coronary artery without angina pectoris: Secondary | ICD-10-CM | POA: Diagnosis present

## 2014-07-08 DIAGNOSIS — R0789 Other chest pain: Secondary | ICD-10-CM

## 2014-07-08 DIAGNOSIS — R079 Chest pain, unspecified: Secondary | ICD-10-CM | POA: Diagnosis not present

## 2014-07-08 DIAGNOSIS — D649 Anemia, unspecified: Secondary | ICD-10-CM | POA: Diagnosis present

## 2014-07-08 DIAGNOSIS — K5090 Crohn's disease, unspecified, without complications: Secondary | ICD-10-CM | POA: Diagnosis not present

## 2014-07-08 DIAGNOSIS — Z6825 Body mass index (BMI) 25.0-25.9, adult: Secondary | ICD-10-CM

## 2014-07-08 DIAGNOSIS — Z881 Allergy status to other antibiotic agents status: Secondary | ICD-10-CM | POA: Diagnosis not present

## 2014-07-08 DIAGNOSIS — M2550 Pain in unspecified joint: Secondary | ICD-10-CM | POA: Diagnosis present

## 2014-07-08 DIAGNOSIS — Z79899 Other long term (current) drug therapy: Secondary | ICD-10-CM | POA: Diagnosis not present

## 2014-07-08 DIAGNOSIS — E669 Obesity, unspecified: Secondary | ICD-10-CM | POA: Diagnosis present

## 2014-07-08 DIAGNOSIS — M25512 Pain in left shoulder: Secondary | ICD-10-CM

## 2014-07-08 DIAGNOSIS — K219 Gastro-esophageal reflux disease without esophagitis: Secondary | ICD-10-CM | POA: Diagnosis present

## 2014-07-08 DIAGNOSIS — K449 Diaphragmatic hernia without obstruction or gangrene: Secondary | ICD-10-CM | POA: Diagnosis present

## 2014-07-08 DIAGNOSIS — I208 Other forms of angina pectoris: Secondary | ICD-10-CM | POA: Diagnosis not present

## 2014-07-08 DIAGNOSIS — I200 Unstable angina: Secondary | ICD-10-CM | POA: Diagnosis present

## 2014-07-08 DIAGNOSIS — Z933 Colostomy status: Secondary | ICD-10-CM | POA: Diagnosis not present

## 2014-07-08 DIAGNOSIS — M25519 Pain in unspecified shoulder: Secondary | ICD-10-CM | POA: Diagnosis present

## 2014-07-08 DIAGNOSIS — L408 Other psoriasis: Secondary | ICD-10-CM | POA: Diagnosis present

## 2014-07-08 DIAGNOSIS — IMO0002 Reserved for concepts with insufficient information to code with codable children: Secondary | ICD-10-CM

## 2014-07-08 HISTORY — DX: Psoriasis, unspecified: L40.9

## 2014-07-08 LAB — BASIC METABOLIC PANEL
ANION GAP: 15 (ref 5–15)
BUN: 17 mg/dL (ref 6–23)
CO2: 17 meq/L — AB (ref 19–32)
CREATININE: 1.28 mg/dL — AB (ref 0.50–1.10)
Calcium: 9.1 mg/dL (ref 8.4–10.5)
Chloride: 108 mEq/L (ref 96–112)
GFR calc non Af Amer: 43 mL/min — ABNORMAL LOW (ref >90)
GFR, EST AFRICAN AMERICAN: 49 mL/min — AB (ref >90)
Glucose, Bld: 138 mg/dL — ABNORMAL HIGH (ref 70–99)
Potassium: 3.5 mEq/L — ABNORMAL LOW (ref 3.7–5.3)
SODIUM: 140 meq/L (ref 137–147)

## 2014-07-08 LAB — CBC WITH DIFFERENTIAL/PLATELET
BASOS ABS: 0 10*3/uL (ref 0.0–0.1)
BASOS PCT: 0 % (ref 0–1)
EOS ABS: 0.1 10*3/uL (ref 0.0–0.7)
EOS PCT: 0 % (ref 0–5)
HCT: 29.6 % — ABNORMAL LOW (ref 36.0–46.0)
HEMOGLOBIN: 9.5 g/dL — AB (ref 12.0–15.0)
Lymphocytes Relative: 18 % (ref 12–46)
Lymphs Abs: 2.8 10*3/uL (ref 0.7–4.0)
MCH: 28.5 pg (ref 26.0–34.0)
MCHC: 32.1 g/dL (ref 30.0–36.0)
MCV: 88.9 fL (ref 78.0–100.0)
Monocytes Absolute: 1.6 10*3/uL — ABNORMAL HIGH (ref 0.1–1.0)
Monocytes Relative: 10 % (ref 3–12)
Neutro Abs: 11.2 10*3/uL — ABNORMAL HIGH (ref 1.7–7.7)
Neutrophils Relative %: 72 % (ref 43–77)
Platelets: 234 10*3/uL (ref 150–400)
RBC: 3.33 MIL/uL — ABNORMAL LOW (ref 3.87–5.11)
RDW: 15.9 % — AB (ref 11.5–15.5)
WBC: 15.6 10*3/uL — ABNORMAL HIGH (ref 4.0–10.5)

## 2014-07-08 LAB — CBC
HCT: 34.6 % — ABNORMAL LOW (ref 36.0–46.0)
Hemoglobin: 10.9 g/dL — ABNORMAL LOW (ref 12.0–15.0)
MCH: 28.2 pg (ref 26.0–34.0)
MCHC: 31.5 g/dL (ref 30.0–36.0)
MCV: 89.4 fL (ref 78.0–100.0)
PLATELETS: 260 10*3/uL (ref 150–400)
RBC: 3.87 MIL/uL (ref 3.87–5.11)
RDW: 15.9 % — AB (ref 11.5–15.5)
WBC: 14.3 10*3/uL — AB (ref 4.0–10.5)

## 2014-07-08 LAB — TROPONIN I: Troponin I: <0.3 ng/mL (ref <0.30)

## 2014-07-08 LAB — TSH: TSH: 0.99 u[IU]/mL (ref 0.350–4.500)

## 2014-07-08 LAB — I-STAT TROPONIN, ED: Troponin i, poc: 0 ng/mL (ref 0.00–0.08)

## 2014-07-08 LAB — PRO B NATRIURETIC PEPTIDE: PRO B NATRI PEPTIDE: 210.7 pg/mL — AB (ref 0–125)

## 2014-07-08 MED ORDER — SPIRONOLACTONE 50 MG PO TABS
50.0000 mg | ORAL_TABLET | Freq: Every day | ORAL | Status: DC
  Administered 2014-07-09 – 2014-07-10 (×2): 50 mg via ORAL
  Filled 2014-07-08 (×2): qty 1

## 2014-07-08 MED ORDER — POTASSIUM CHLORIDE CRYS ER 20 MEQ PO TBCR
20.0000 meq | EXTENDED_RELEASE_TABLET | Freq: Three times a day (TID) | ORAL | Status: DC
  Administered 2014-07-08 – 2014-07-10 (×5): 20 meq via ORAL
  Filled 2014-07-08 (×9): qty 1

## 2014-07-08 MED ORDER — PANTOPRAZOLE SODIUM 40 MG PO TBEC
40.0000 mg | DELAYED_RELEASE_TABLET | Freq: Two times a day (BID) | ORAL | Status: DC
  Administered 2014-07-09 – 2014-07-10 (×3): 40 mg via ORAL
  Filled 2014-07-08 (×2): qty 1

## 2014-07-08 MED ORDER — ACETAMINOPHEN 325 MG PO TABS
650.0000 mg | ORAL_TABLET | ORAL | Status: DC | PRN
  Administered 2014-07-09: 650 mg via ORAL
  Filled 2014-07-08: qty 2

## 2014-07-08 MED ORDER — ASPIRIN 81 MG PO CHEW
324.0000 mg | CHEWABLE_TABLET | Freq: Once | ORAL | Status: AC
  Administered 2014-07-08: 324 mg via ORAL
  Filled 2014-07-08: qty 4

## 2014-07-08 MED ORDER — ONDANSETRON HCL 4 MG/2ML IJ SOLN: 4.0000 mg | Freq: Four times a day (QID) | INTRAMUSCULAR | Status: DC | PRN

## 2014-07-08 MED ORDER — ALBUTEROL SULFATE (2.5 MG/3ML) 0.083% IN NEBU: 3.0000 mL | INHALATION_SOLUTION | Freq: Four times a day (QID) | RESPIRATORY_TRACT | Status: DC | PRN

## 2014-07-08 MED ORDER — HEPARIN (PORCINE) IN NACL 100-0.45 UNIT/ML-% IJ SOLN
1150.0000 [IU]/h | INTRAMUSCULAR | Status: DC
Start: 2014-07-08 — End: 2014-07-09
  Administered 2014-07-08: 950 [IU]/h via INTRAVENOUS
  Administered 2014-07-09: 1150 [IU]/h via INTRAVENOUS
  Filled 2014-07-08 (×2): qty 250

## 2014-07-08 MED ORDER — SIMETHICONE 80 MG PO CHEW
80.0000 mg | CHEWABLE_TABLET | Freq: Four times a day (QID) | ORAL | Status: DC | PRN
  Filled 2014-07-08: qty 1

## 2014-07-08 MED ORDER — DICYCLOMINE HCL 10 MG/5ML PO SOLN
10.0000 mg | Freq: Two times a day (BID) | ORAL | Status: DC
  Administered 2014-07-08 – 2014-07-10 (×4): 10 mg via ORAL
  Filled 2014-07-08 (×7): qty 5

## 2014-07-08 MED ORDER — DIPHENOXYLATE-ATROPINE 2.5-0.025 MG PO TABS
1.0000 | ORAL_TABLET | Freq: Four times a day (QID) | ORAL | Status: DC | PRN
  Administered 2014-07-09 – 2014-07-10 (×3): 1 via ORAL
  Filled 2014-07-08 (×3): qty 1

## 2014-07-08 MED ORDER — METOPROLOL TARTRATE 25 MG PO TABS
25.0000 mg | ORAL_TABLET | Freq: Two times a day (BID) | ORAL | Status: DC
  Administered 2014-07-08 – 2014-07-10 (×3): 25 mg via ORAL
  Filled 2014-07-08 (×5): qty 1

## 2014-07-08 MED ORDER — HEPARIN BOLUS VIA INFUSION
4000.0000 [IU] | Freq: Once | INTRAVENOUS | Status: AC
  Administered 2014-07-08: 4000 [IU] via INTRAVENOUS
  Filled 2014-07-08: qty 4000

## 2014-07-08 MED ORDER — TRIAMCINOLONE ACETONIDE 0.1 % EX CREA: 1.0000 "application " | TOPICAL_CREAM | Freq: Two times a day (BID) | CUTANEOUS | Status: DC

## 2014-07-08 MED ORDER — ADULT MULTIVITAMIN W/MINERALS CH
1.0000 | ORAL_TABLET | Freq: Every day | ORAL | Status: DC
  Administered 2014-07-09 – 2014-07-10 (×2): 1 via ORAL
  Filled 2014-07-08 (×2): qty 1

## 2014-07-08 MED ORDER — ASPIRIN 81 MG PO CHEW: 324.0000 mg | CHEWABLE_TABLET | ORAL | Status: AC

## 2014-07-08 MED ORDER — NITROGLYCERIN 0.4 MG SL SUBL: 0.4000 mg | SUBLINGUAL_TABLET | SUBLINGUAL | Status: DC | PRN

## 2014-07-08 MED ORDER — ASPIRIN EC 81 MG PO TBEC
81.0000 mg | DELAYED_RELEASE_TABLET | Freq: Every day | ORAL | Status: DC
  Administered 2014-07-09 – 2014-07-10 (×2): 81 mg via ORAL
  Filled 2014-07-08 (×3): qty 1

## 2014-07-08 MED ORDER — ONDANSETRON HCL 4 MG PO TABS: 4.0000 mg | ORAL_TABLET | Freq: Three times a day (TID) | ORAL | Status: DC | PRN

## 2014-07-08 MED ORDER — ZOLPIDEM TARTRATE 5 MG PO TABS
5.0000 mg | ORAL_TABLET | Freq: Every evening | ORAL | Status: DC | PRN
  Administered 2014-07-08 – 2014-07-09 (×2): 5 mg via ORAL
  Filled 2014-07-08 (×2): qty 1

## 2014-07-08 MED ORDER — HYDROCODONE-ACETAMINOPHEN 7.5-325 MG PO TABS
1.0000 | ORAL_TABLET | Freq: Four times a day (QID) | ORAL | Status: DC | PRN
  Administered 2014-07-09 – 2014-07-10 (×2): 1 via ORAL
  Filled 2014-07-08 (×2): qty 1

## 2014-07-08 MED ORDER — DIPHENHYDRAMINE HCL 25 MG PO CAPS: 25.0000 mg | ORAL_CAPSULE | Freq: Four times a day (QID) | ORAL | Status: DC | PRN

## 2014-07-08 MED ORDER — ASPIRIN 300 MG RE SUPP: 300.0000 mg | RECTAL | Status: AC

## 2014-07-08 MED ORDER — TRIAMCINOLONE ACETONIDE 0.1 % EX CREA
TOPICAL_CREAM | Freq: Two times a day (BID) | CUTANEOUS | Status: DC
  Administered 2014-07-08 – 2014-07-10 (×4): via TOPICAL
  Filled 2014-07-08: qty 15

## 2014-07-08 NOTE — ED Provider Notes (Signed)
CSN: [id number]     Arrival date & time 07/08/14  1432 History   First MD Initiated Contact with Patient 07/08/14 1538     Chief Complaint  Patient presents with  . Chest Pain     (Consider location/radiation/quality/duration/timing/severity/associated sxs/prior Treatment) HPI Comments: Patient developed severe left shoulder pain sitting not eating. This pain radiated to her left anterior chest. It was associated with nausea, diaphoresis and shortness of breath. Pain resolved on its own after about one hour. She is not have panic this in the past. She denies any previous cardiac history. She's never had a stress test. Her medical history includes Crohn's disease. She was recently on a course of prednisone that caused leg swelling. She states she was recently treated for UTI as well. She denies any back pain, cough or fever. No abdominal pain or vomiting.  The history is provided by the patient.    Past Medical History  Diagnosis Date  . Crohn's disease   . Chronic diarrhea   . GERD (gastroesophageal reflux disease)   . Polyarthralgia   . Hiatal hernia   . Hemorrhoids   . Anemia   . Hiatal hernia     hiatal  . Pneumonia 2014  . Gout   . Psoriasis    Past Surgical History  Procedure Laterality Date  . Bowel resection  2009  . 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 Anal stenosis  ~ 2010  . Colonoscopy  2013  . Cholecystectomy  1990's  . Appendectomy  1980's  . Goiter  1999  . Esophagogastroduodenoscopy  11/11/2011    Procedure: ESOPHAGOGASTRODUODENOSCOPY (EGD);  Surgeon: Missy Sabins, MD;  Location: [facility] ENDOSCOPY;  Service: Endoscopy;  Laterality: N/A;  . Flexible sigmoidoscopy  11/11/2011    Procedure: FLEXIBLE SIGMOIDOSCOPY;  Surgeon: Missy Sabins, MD;  Location: [facility];  Service: Endoscopy;  Laterality: N/A;  . Esophagogastroduodenoscopy  05/18/2012    Procedure: ESOPHAGOGASTRODUODENOSCOPY (EGD);  Surgeon: Rogene Houston, MD;  Location: [facility] ENDO SUITE;  Service: Endoscopy;   Laterality: N/A;  200  . Colostomy takedown  2009    "only had it for about 1 month" (08/22/2013)  . Tonsillectomy     Family History  Problem Relation Age of Onset  . Diabetes Mother   . Healthy Sister   . Hypertension Brother   . Colon cancer Brother     died with colon cancer  . Heart disease Father    History  Substance Use Topics  . Smoking status: Former Smoker -- 0.12 packs/day for 4 years    Types: Cigarettes    Quit date: 10/05/1996  . Smokeless tobacco: Never Used     Comment: 08/22/2013 1 pack every two weeks when she did smoked  . Alcohol Use: 1.2 oz/week    2 Glasses of wine per week     Comment: 08/22/2013 "glass of wine maybe twice/wk"   OB History   Grav Para Term Preterm Abortions TAB SAB Ect Mult Living                 Review of Systems  Constitutional: Positive for diaphoresis. Negative for fever, activity change and appetite change.  HENT: Negative for congestion and rhinorrhea.   Respiratory: Positive for chest tightness and shortness of breath. Negative for cough.   Cardiovascular: Positive for chest pain.  Gastrointestinal: Positive for nausea. Negative for vomiting and abdominal pain.  Genitourinary: Negative for vaginal bleeding and vaginal discharge.  Musculoskeletal: Negative for arthralgias and  myalgias.  Neurological: Negative for dizziness and light-headedness.  A complete 10 system review of systems was obtained and all systems are negative except as noted in the HPI and PMH.      Allergies  Remicade; Zithromax; Aspirin; Ciprofloxacin; and Sulfa antibiotics  Home Medications   Prior to Admission medications   Medication Sig Start Date End Date Taking? Authorizing Provider  albuterol (PROVENTIL HFA;VENTOLIN HFA) 108 (90 BASE) MCG/ACT inhaler Inhale 2 puffs into the lungs every 6 (six) hours as needed for wheezing. 02/27/13  Yes Nishant Dhungel, MD  Calcium Carbonate-Vitamin D (CALCIUM + D PO) Take 1 tablet by mouth daily.   Yes Historical  Provider, MD  Cyanocobalamin (VITAMIN B-12 IJ) Inject 1 each as directed every 30 (thirty) days. Around the 1st week of each month   Yes Historical Provider, MD  dicyclomine (BENTYL) 10 MG/5ML syrup Take 10 mg by mouth 2 (two) times daily with a meal.   Yes Historical Provider, MD  diphenhydrAMINE (BENADRYL) 25 mg capsule Take 25 mg by mouth every 6 (six) hours as needed for itching. 03/22/14  Yes Adeline C Viyuoh, MD  diphenoxylate-atropine (LOMOTIL) 2.5-0.025 MG per tablet TAKE 1 TABLET BY MOUTH 3 TIMES A DAY AS NEEDED FOR DIARHEA OR LOOSE STOOLS. 06/19/14  Yes Rogene Houston, MD  HYDROcodone-acetaminophen (NORCO) 7.5-325 MG per tablet Take 1 tablet by mouth every 6 (six) hours as needed for moderate pain.   Yes Historical Provider, MD  Multiple Vitamin (MULTIVITAMIN WITH MINERALS) TABS Take 1 tablet by mouth daily.   Yes Historical Provider, MD  nystatin cream (MYCOSTATIN) Apply 1 application topically 2 (two) times daily. 06/28/14  Yes Rogene Houston, MD  ondansetron (ZOFRAN) 4 MG tablet Take 4 mg by mouth every 8 (eight) hours as needed for nausea. 06/13/14  Yes Butch Penny, NP  pantoprazole (PROTONIX) 40 MG tablet Take 1 tablet (40 mg total) by mouth 2 (two) times daily before a meal. 03/29/14  Yes Butch Penny, NP  potassium chloride SA (K-DUR,KLOR-CON) 20 MEQ tablet Take 20 mEq by mouth 3 (three) times daily.   Yes Historical Provider, MD  spironolactone (ALDACTONE) 50 MG tablet Take 50 mg by mouth daily.   Yes Historical Provider, MD  triamcinolone cream (KENALOG) 0.1 % Apply 1 application topically 2 (two) times daily. 06/28/14  Yes Rogene Houston, MD  zolpidem (AMBIEN) 10 MG tablet Take 10 mg by mouth at bedtime as needed for sleep.  01/04/14  Yes Historical Provider, MD  predniSONE (DELTASONE) 10 MG tablet Take 10 mg by mouth See admin instructions. Taper 6 tablets down every three days.Otho Darner of 71. Patient states she completed course of medication today (07-08-14)    Historical Provider, MD    BP 139/64  Pulse 69  Temp(Src) 97.2 F (36.2 C) (Oral)  Resp 22  Ht 5' 4.5" (1.638 m)  Wt 150 lb (68.04 kg)  BMI 25.36 kg/m2  SpO2 100% Physical Exam  Nursing note and vitals reviewed. Constitutional: She is oriented to person, place, and time. She appears well-developed and well-nourished. No distress.  HENT:  Head: Normocephalic and atraumatic.  Mouth/Throat: Oropharynx is clear and moist. No oropharyngeal exudate.  Eyes: Conjunctivae and EOM are normal. Pupils are equal, round, and reactive to light.  Neck: Normal range of motion. Neck supple.  No meningismus.  Cardiovascular: Normal rate, regular rhythm, normal heart sounds and intact distal pulses.   No murmur heard. Pulmonary/Chest: Effort normal and breath sounds normal. No respiratory distress. She  exhibits no tenderness.  Abdominal: Soft. There is no tenderness. There is no rebound and no guarding.  Musculoskeletal: Normal range of motion. She exhibits edema. She exhibits no tenderness.  FROM L shoulder without pain.  Neurovascularly intact  +2 pitting edema to knees.  Discoloration (chronic per patient)  Neurological: She is alert and oriented to person, place, and time. No cranial nerve deficit. She exhibits normal muscle tone. Coordination normal.  No ataxia on finger to nose bilaterally. No pronator drift. 5/5 strength throughout. CN 2-12 intact. Negative Romberg. Equal grip strength. Sensation intact. Gait is normal.   Skin: Skin is warm.  Psychiatric: She has a normal mood and affect. Her behavior is normal.    ED Course  Procedures (including critical care time) Labs Review Labs Reviewed  CBC - Abnormal; Notable for the following:    WBC 14.3 (*)    Hemoglobin 10.9 (*)    HCT 34.6 (*)    RDW 15.9 (*)    All other components within normal limits  BASIC METABOLIC PANEL - Abnormal; Notable for the following:    Potassium 3.5 (*)    CO2 17 (*)    Glucose, Bld 138 (*)    Creatinine, Ser 1.28 (*)    GFR  calc non Af Amer 43 (*)    GFR calc Af Amer 49 (*)    All other components within normal limits  PRO B NATRIURETIC PEPTIDE - Abnormal; Notable for the following:    Pro B Natriuretic peptide (BNP) 210.7 (*)    All other components within normal limits  CBC - Abnormal; Notable for the following:    WBC 16.2 (*)    RBC 3.30 (*)    Hemoglobin 9.3 (*)    HCT 29.2 (*)    RDW 15.8 (*)    All other components within normal limits  CBC WITH DIFFERENTIAL - Abnormal; Notable for the following:    WBC 15.6 (*)    RBC 3.33 (*)    Hemoglobin 9.5 (*)    HCT 29.6 (*)    RDW 15.9 (*)    Neutro Abs 11.2 (*)    Monocytes Absolute 1.6 (*)    All other components within normal limits  COMPREHENSIVE METABOLIC PANEL - Abnormal; Notable for the following:    CO2 16 (*)    Glucose, Bld 114 (*)    Creatinine, Ser 1.19 (*)    Calcium 8.2 (*)    Albumin 2.7 (*)    Total Bilirubin <0.2 (*)    GFR calc non Af Amer 47 (*)    GFR calc Af Amer 54 (*)    All other components within normal limits  TROPONIN I  TROPONIN I  TSH  HEPARIN LEVEL (UNFRACTIONATED)  LIPID PANEL  TROPONIN I  I-STAT TROPOININ, ED    Imaging Review Dg Chest 2 View (if Patient Has Fever And/or Copd)  07/08/2014   CLINICAL DATA:  Chest and shoulder pain  EXAM: CHEST  2 VIEW  COMPARISON:  03/20/2014  FINDINGS: A large hiatal hernia is again identified. The cardiac shadow is stable. The lungs are well-aerated without focal infiltrate or sizable effusion. No bony abnormality is seen. Old rib fractures are again seen on the right.  IMPRESSION: No active cardiopulmonary disease.   Electronically Signed   By: Inez Catalina M.D.   On: 07/08/2014 15:38     EKG Interpretation   Date/Time:  Sunday July 08 2014 14:32:54 EDT Ventricular Rate:  89 PR Interval:  106 QRS  Duration: 74 QT Interval:  350 QTC Calculation: 425 R Axis:   -14 Text Interpretation:  Sinus rhythm with short PR Left ventricular  hypertrophy Possible Lateral infarct  , age undetermined Abnormal ECG  lateral ST depressions and T wave inversions Confirmed by Patterson Hollenbaugh  MD,  Sabrina Arriaga [id number]) on 07/08/2014 3:39:58 PM      MDM   Final diagnoses:  Unstable angina   Episode of shoulder and chest pain with SOB, nausea, diaphoresis.  Now resolved.  EKG with new ST depressions and T wave inversions laterally. Troponin negative. Patient states able to tolerate ASA. Remains pain free in the ED. Concern for unstable angina. D/w Cardiology who will evaluate.   Ezequiel Essex, MD 07/09/14 [phone]

## 2014-07-08 NOTE — ED Notes (Signed)
Unable to obtain IV access with 2 nurses attempting. Paged IV team.

## 2014-07-08 NOTE — H&P (Signed)
History and Physical   Admit date: 06/11/2830 Name:  Danielle [name] Medical record number: [id number] DOB/Age:  [birth year]  68 y.o. female  Referring Physician:   [facility]  Primary Physician:   Dr. Berdine Addison  Chief complaint/reason for admission: Severe shoulder pain  HPI:  This very nice 68 year-old black female has a prior history of severe Crohn's disease with previous colostomy and perianal involvement and has chronic diarrhea. She has also previously been on Remicade this was recently stopped and she was placed on Humira. She has taken a few doses of this. She has had a chronic skin rash and has been seen at [facility] and diagnosed with psoriasis. She is normally followed by the rheumatologist. She has no prior cardiac disease but does have episodic pneumonia.  She was out eating lunch with her family and had the onset of left shoulder discomfort with slight radiation to the chest it was brief and then had severe shoulder pain that happened suddenly accompanied with diaphoresis and sweating. The discomfort persisted and she had some diarrhea and was brought to the emergency room. Her left relieving her bowels the discomfort got better and she is currently feeling fine. She does not have any exertional chest pain or prior cardiac history. She has no prior stress testing. She has noted some swelling of both lower extremities recently do to recently having completed taking a course of prednisone for a skin rash that she was seen at [facility] for. She normally denies PND, orthopnea or claudication.    Past Medical History  Diagnosis Date  . Crohn's disease   . Chronic diarrhea   . GERD (gastroesophageal reflux disease)   . Polyarthralgia   . Hiatal hernia   . Hemorrhoids   . Anemia   . Hiatal hernia     hiatal  . Pneumonia 2014  . Gout   . Psoriasis        Past Surgical History  Procedure Laterality Date  . Bowel resection  2009  . Colostomy  2009   DEMASO  . Anal stenosis  ~ 2010  . Colonoscopy  2013  . Cholecystectomy  1990's  . Appendectomy  1980's  . Goiter  1999  . Esophagogastroduodenoscopy  11/11/2011    Procedure: ESOPHAGOGASTRODUODENOSCOPY (EGD);  Surgeon: Missy Sabins, MD;  Location: [facility] ENDOSCOPY;  Service: Endoscopy;  Laterality: N/A;  . Flexible sigmoidoscopy  11/11/2011    Procedure: FLEXIBLE SIGMOIDOSCOPY;  Surgeon: Missy Sabins, MD;  Location: [place];  Service: Endoscopy;  Laterality: N/A;  . Esophagogastroduodenoscopy  05/18/2012    Procedure: ESOPHAGOGASTRODUODENOSCOPY (EGD);  Surgeon: Rogene Houston, MD;  Location: [facility] ENDO [facility];  Service: Endoscopy;  Laterality: N/A;  200  . Colostomy takedown  2009    "only had it for about 1 month" (08/22/2013)  . Tonsillectomy    .  Allergies: is allergic to remicade; zithromax; aspirin; ciprofloxacin; and sulfa antibiotics.   Medications: Prior to Admission medications   Medication Sig Start Date End Date Taking? Authorizing Provider  albuterol (PROVENTIL HFA;VENTOLIN HFA) 108 (90 BASE) MCG/ACT inhaler Inhale 2 puffs into the lungs every 6 (six) hours as needed for wheezing. 02/27/13  Yes Nishant Dhungel, MD  Calcium Carbonate-Vitamin D (CALCIUM + D PO) Take 1 tablet by mouth daily.   Yes Historical Provider, MD  Cyanocobalamin (VITAMIN B-12 IJ) Inject 1 each as directed every 30 (thirty) days. Around the 1st week of each month   Yes Historical Provider, MD  dicyclomine (  BENTYL) 10 MG/5ML syrup Take 10 mg by mouth 2 (two) times daily with a meal.   Yes Historical Provider, MD  diphenhydrAMINE (BENADRYL) 25 mg capsule Take 25 mg by mouth every 6 (six) hours as needed for itching. 03/22/14  Yes Adeline C Viyuoh, MD  diphenoxylate-atropine (LOMOTIL) 2.5-0.025 MG per tablet TAKE 1 TABLET BY MOUTH 3 TIMES A DAY AS NEEDED FOR DIARHEA OR LOOSE STOOLS. 06/19/14  Yes Rogene Houston, MD  HYDROcodone-acetaminophen (NORCO) 7.5-325 MG per tablet Take 1 tablet by mouth every 6 (six) hours  as needed for moderate pain.   Yes Historical Provider, MD  Multiple Vitamin (MULTIVITAMIN WITH MINERALS) TABS Take 1 tablet by mouth daily.   Yes Historical Provider, MD  nystatin cream (MYCOSTATIN) Apply 1 application topically 2 (two) times daily. 06/28/14  Yes Rogene Houston, MD  ondansetron (ZOFRAN) 4 MG tablet Take 4 mg by mouth every 8 (eight) hours as needed for nausea. 06/13/14  Yes Butch Penny, NP  pantoprazole (PROTONIX) 40 MG tablet Take 1 tablet (40 mg total) by mouth 2 (two) times daily before a meal. 03/29/14  Yes Butch Penny, NP  potassium chloride SA (K-DUR,KLOR-CON) 20 MEQ tablet Take 20 mEq by mouth 3 (three) times daily.   Yes Historical Provider, MD  spironolactone (ALDACTONE) 50 MG tablet Take 50 mg by mouth daily.   Yes Historical Provider, MD  triamcinolone cream (KENALOG) 0.1 % Apply 1 application topically 2 (two) times daily. 06/28/14  Yes Rogene Houston, MD  zolpidem (AMBIEN) 10 MG tablet Take 10 mg by mouth at bedtime as needed for sleep.  01/04/14  Yes Historical Provider, MD  predniSONE (DELTASONE) 10 MG tablet Take 10 mg by mouth See admin instructions. Taper 6 tablets down every three days.Otho Darner of 71. Patient states she completed course of medication today (07-08-14)    Historical Provider, MD    Family History:  Family Status  Relation Status Death Age  . Mother Deceased 64    diabetes  . Father Deceased 15    Congestive heart failure  . Sister Alive   . Brother Alive   . Brother Deceased 47    prostate cancer    Social History:   reports that she quit smoking about 17 years ago. Her smoking use included Cigarettes. She has a .48 pack-year smoking history. She has never used smokeless tobacco. She reports that she drinks about 1.2 ounces of alcohol per week. She reports that she does not use illicit drugs.   History   Social History Narrative   Lives in [place].  Moved from [place] in 2008.  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  LIves with her sister.   No home  health services.       Review of Systems: Significant skin rash that she has been seeing doctors at [facility] for. Chronic diarrhea. She has had a 15 pound weight gain. She has no rectal bleeding and does have some mild heartburn. She has had some episodic wheezing for which she uses inhalers. She has had urinary tract infections recently. She has significant arthralgias. Other than as noted above, the remainder of the review of systems is normal  Physical Exam: BP 128/81  Pulse 99  Temp(Src) 97.8 F (36.6 C) (Oral)  Resp 19  Ht 5' 4.5" (1.638 m)  Wt 68.04 kg (150 lb)  BMI 25.36 kg/m2  SpO2 100%  General appearance: Pleasant black female currently in no acute distress, mildly obese Head:  Normocephalic, without obvious abnormality, atraumatic Eyes: conjunctivae/corneas clear. PERRL, EOM's intact. Fundi not examined  Neck: no adenopathy, no carotid bruit, no JVD and supple, symmetrical, trachea midline Lungs: clear to auscultation bilaterally Heart: regular rate and rhythm, S1, S2 normal, no murmur, click, rub or gallop Abdomen: soft, non-tender; bowel sounds normal; no masses,  no organomegaly Extremities: Extremities with 2+ edema, discoloration of the lower extremities, Pulses: 2+ and symmetric Skin: Scattered maculopapular lesions Neurologic: Grossly normal  Labs: CBC  Recent Labs  07/08/14 1454  WBC 14.3*  RBC 3.87  HGB 10.9*  HCT 34.6*  PLT 260  MCV 89.4  MCH 28.2  MCHC 31.5  RDW 15.9*   CMP   Recent Labs  07/08/14 1454  NA 140  K 3.5*  CL 108  CO2 17*  GLUCOSE 138*  BUN 17  CREATININE 1.28*  CALCIUM 9.1  GFRNONAA 43*  GFRAA 49*   BNP (last 3 results)  Recent Labs  03/20/14 1052 07/08/14 1454  PROBNP 255.5* 210.7*   Cardiac Panel (last 3 results) Troponin (Point of Care Test)  Recent Labs  07/08/14 1513  TROPIPOC 0.00   Thyroid  Lab Results  Component Value Date   TSH 1.713 12/25/2012    EKG: Sinus rhythm, voltage criteria  for LVH, poor with progression laterally with T wave inversions that are new in the lateral leads  Radiology: Large hiatal hernia, clear lungs   IMPRESSIONS: 1. Prolonged shoulder pain cup he was diaphoresis and sweating that may be an ischemic equivalent 2. Severe Crohn's disease 3. Polyarthralgia possibly due to Crohn's 4. Recent initiation of Humira 5. Obesity 6. Skin rash thought to be psoriasis 7. Large hiatal hernia with esophageal reflux  PLAN: History is suggestive of a possible prolonged ischemic episode. She will have serial enzymes and serial EKG. New T wave inversions laterally. May need to have invasive evaluation to assess for coronary artery disease versus stress testing depending on outcome.  Signed: Kerry Hough MD [facility] Cardiology  07/08/2014, 4:40 PM

## 2014-07-08 NOTE — Progress Notes (Signed)
ANTICOAGULATION CONSULT NOTE - Initial Consult  Pharmacy Consult for Heparin Indication: chest pain/ACS  Allergies  Allergen Reactions  . Remicade [Infliximab] Rash    Per Patient - she has developed a rash. Being seen by Dr. Flisher/Dermatology @ [facility].   . Zithromax [Azithromycin] Swelling    Patient states that she has a swollen mouth/ blisters, yeast present mouth and vaginia.  . Aspirin Other (See Comments)    Cramps her stomach  . Ciprofloxacin     unknown  . Sulfa Antibiotics Nausea And Vomiting    Patient Measurements: Height: 5' 4.5" (163.8 cm) Weight: 150 lb (68.04 kg) IBW/kg (Calculated) : 55.85 Heparin Dosing Weight:  68 kg  Vital Signs: Temp: 97.8 F (36.6 C) (08/02 1451) Temp src: Oral (08/02 1451) BP: 125/79 mmHg (08/02 1730) Pulse Rate: 88 (08/02 1730)  Labs:  Recent Labs  07/08/14 1454  HGB 10.9*  HCT 34.6*  PLT 260  CREATININE 1.28*    Estimated Creatinine Clearance: 41.4 ml/min (by C-G formula based on Cr of 1.28).   Medical History: Past Medical History  Diagnosis Date  . Crohn's disease   . Chronic diarrhea   . GERD (gastroesophageal reflux disease)   . Polyarthralgia   . Hiatal hernia   . Hemorrhoids   . Anemia   . Hiatal hernia     hiatal  . Pneumonia 2014  . Gout   . Psoriasis     Medications:  See med rec  Assessment: CP 68 y/o BF presents with CP that radiated to L shoulder, nausea, diaphoresis, SOB. Recently treated for UTI. Patient has significant past GI history.New T wave inversions laterally on EKG. WBC elevated 14.3 (recent steroids for psoriasis). Hgb low 10.9 (chronic), Plts 260 ok, Scr 1.28 with CrCl 41.  Goal of Therapy:  Heparin level 0.3-0.7 units/ml Monitor platelets by anticoagulation protocol: Yes   Plan:  F/u cardiac enzymes Heparin 4000 units IV bolus Heparin infusion at 950 units/hr Daily heparin level and CBC.   Kourosh Jablonsky S. Alford Highland, PharmD, BCPS Clinical Staff Pharmacist Pager  [phone]  Eilene Ghazi Stillinger 07/08/2014,5:48 PM

## 2014-07-08 NOTE — ED Notes (Addendum)
Pt c/o left chest/shoulder pain onset today while out eating. Pt repots nausea and shortness of breath. Pt talking in complete sentences without difficulty. Pt presents with swelling to B/L feet, reports she had a reaction to prednisone.

## 2014-07-08 NOTE — ED Notes (Signed)
IV team at bedside 

## 2014-07-09 ENCOUNTER — Encounter (HOSPITAL_COMMUNITY)
Admission: EM | Disposition: A | Discharge status: Home / Self Care | Payer: Self-pay | Source: Home / Self Care | Attending: Cardiology

## 2014-07-09 ENCOUNTER — Other Ambulatory Visit: Payer: Self-pay

## 2014-07-09 DIAGNOSIS — D649 Anemia, unspecified: Secondary | ICD-10-CM

## 2014-07-09 DIAGNOSIS — I208 Other forms of angina pectoris: Secondary | ICD-10-CM

## 2014-07-09 HISTORY — PX: LEFT HEART CATHETERIZATION WITH CORONARY ANGIOGRAM: SHX5451

## 2014-07-09 LAB — COMPREHENSIVE METABOLIC PANEL
ALBUMIN: 2.7 g/dL — AB (ref 3.5–5.2)
ALT: 22 U/L (ref 0–35)
AST: 18 U/L (ref 0–37)
Alkaline Phosphatase: 112 U/L (ref 39–117)
Anion gap: 14 (ref 5–15)
BUN: 18 mg/dL (ref 6–23)
CALCIUM: 8.2 mg/dL — AB (ref 8.4–10.5)
CHLORIDE: 108 meq/L (ref 96–112)
CO2: 16 mEq/L — ABNORMAL LOW (ref 19–32)
CREATININE: 1.19 mg/dL — AB (ref 0.50–1.10)
GFR calc Af Amer: 54 mL/min — ABNORMAL LOW (ref >90)
GFR calc non Af Amer: 47 mL/min — ABNORMAL LOW (ref >90)
Glucose, Bld: 114 mg/dL — ABNORMAL HIGH (ref 70–99)
Potassium: 3.7 mEq/L (ref 3.7–5.3)
Sodium: 138 mEq/L (ref 137–147)
Total Bilirubin: <0.2 mg/dL — ABNORMAL LOW (ref 0.3–1.2)
Total Protein: 6.3 g/dL (ref 6.0–8.3)

## 2014-07-09 LAB — LIPID PANEL
CHOL/HDL RATIO: 1.7 ratio
Cholesterol: 172 mg/dL (ref 0–200)
HDL: 104 mg/dL (ref >39)
LDL Cholesterol: 54 mg/dL (ref 0–99)
Triglycerides: 68 mg/dL (ref <150)
VLDL: 14 mg/dL (ref 0–40)

## 2014-07-09 LAB — CBC
HEMATOCRIT: 29.2 % — AB (ref 36.0–46.0)
HEMATOCRIT: 31.4 % — AB (ref 36.0–46.0)
HEMOGLOBIN: 9.3 g/dL — AB (ref 12.0–15.0)
HEMOGLOBIN: 9.9 g/dL — AB (ref 12.0–15.0)
MCH: 28.2 pg (ref 26.0–34.0)
MCH: 28 pg (ref 26.0–34.0)
MCHC: 31.8 g/dL (ref 30.0–36.0)
MCHC: 31.5 g/dL (ref 30.0–36.0)
MCV: 88.5 fL (ref 78.0–100.0)
MCV: 89 fL (ref 78.0–100.0)
PLATELETS: 249 10*3/uL (ref 150–400)
Platelets: 264 10*3/uL (ref 150–400)
RBC: 3.3 MIL/uL — AB (ref 3.87–5.11)
RBC: 3.53 MIL/uL — AB (ref 3.87–5.11)
RDW: 15.8 % — ABNORMAL HIGH (ref 11.5–15.5)
RDW: 15.8 % — ABNORMAL HIGH (ref 11.5–15.5)
WBC: 16.2 10*3/uL — AB (ref 4.0–10.5)
WBC: 14.6 10*3/uL — ABNORMAL HIGH (ref 4.0–10.5)

## 2014-07-09 LAB — TROPONIN I: Troponin I: <0.3 ng/mL (ref <0.30)

## 2014-07-09 LAB — HEPARIN LEVEL (UNFRACTIONATED)
Heparin Unfractionated: 0.3 IU/mL (ref 0.30–0.70)
Heparin Unfractionated: 0.24 IU/mL — ABNORMAL LOW (ref 0.30–0.70)

## 2014-07-09 LAB — BASIC METABOLIC PANEL
Anion gap: 13 (ref 5–15)
BUN: 17 mg/dL (ref 6–23)
CO2: 19 meq/L (ref 19–32)
Calcium: 8.4 mg/dL (ref 8.4–10.5)
Chloride: 110 mEq/L (ref 96–112)
Creatinine, Ser: 1.19 mg/dL — ABNORMAL HIGH (ref 0.50–1.10)
GFR calc Af Amer: 54 mL/min — ABNORMAL LOW (ref >90)
GFR calc non Af Amer: 47 mL/min — ABNORMAL LOW (ref >90)
GLUCOSE: 80 mg/dL (ref 70–99)
Potassium: 3.6 mEq/L — ABNORMAL LOW (ref 3.7–5.3)
SODIUM: 142 meq/L (ref 137–147)

## 2014-07-09 LAB — PROTIME-INR
INR: 1 (ref 0.00–1.49)
PROTHROMBIN TIME: 13.2 s (ref 11.6–15.2)

## 2014-07-09 SURGERY — LEFT HEART CATHETERIZATION WITH CORONARY ANGIOGRAM
Anesthesia: LOCAL

## 2014-07-09 MED ORDER — SODIUM CHLORIDE 0.9 % IV SOLN
INTRAVENOUS | Status: DC
  Administered 2014-07-09: 1000 mL via INTRAVENOUS

## 2014-07-09 MED ORDER — HEPARIN SODIUM (PORCINE) 1000 UNIT/ML IJ SOLN
INTRAMUSCULAR | Status: AC
  Filled 2014-07-09: qty 1

## 2014-07-09 MED ORDER — FENTANYL CITRATE 0.05 MG/ML IJ SOLN
INTRAMUSCULAR | Status: AC
  Filled 2014-07-09: qty 2

## 2014-07-09 MED ORDER — VERAPAMIL HCL 2.5 MG/ML IV SOLN
INTRAVENOUS | Status: AC
  Filled 2014-07-09: qty 2

## 2014-07-09 MED ORDER — ASPIRIN 81 MG PO CHEW
81.0000 mg | CHEWABLE_TABLET | ORAL | Status: DC
  Filled 2014-07-09: qty 1

## 2014-07-09 MED ORDER — NITROGLYCERIN 1 MG/10 ML FOR IR/CATH LAB
INTRA_ARTERIAL | Status: AC
  Filled 2014-07-09: qty 10

## 2014-07-09 MED ORDER — SODIUM CHLORIDE 0.9 % IJ SOLN: 3.0000 mL | INTRAMUSCULAR | Status: DC | PRN

## 2014-07-09 MED ORDER — SODIUM CHLORIDE 0.9 % IV SOLN: 250.0000 mL | INTRAVENOUS | Status: DC | PRN

## 2014-07-09 MED ORDER — MIDAZOLAM HCL 2 MG/2ML IJ SOLN
INTRAMUSCULAR | Status: AC
  Filled 2014-07-09: qty 2

## 2014-07-09 MED ORDER — LIDOCAINE HCL (PF) 1 % IJ SOLN
INTRAMUSCULAR | Status: AC
  Filled 2014-07-09: qty 30

## 2014-07-09 MED ORDER — SODIUM CHLORIDE 0.9 % IV SOLN
1.0000 mL/kg/h | INTRAVENOUS | Status: AC
  Administered 2014-07-09: 1 mL/kg/h via INTRAVENOUS

## 2014-07-09 MED ORDER — SODIUM CHLORIDE 0.9 % IJ SOLN
3.0000 mL | Freq: Two times a day (BID) | INTRAMUSCULAR | Status: DC
  Administered 2014-07-09: 3 mL via INTRAVENOUS

## 2014-07-09 MED ORDER — HEPARIN (PORCINE) IN NACL 2-0.9 UNIT/ML-% IJ SOLN
INTRAMUSCULAR | Status: AC
  Filled 2014-07-09: qty 1000

## 2014-07-09 NOTE — H&P (View-Only) (Signed)
Patient Profile:  68 y/o AAF with h/o severe Crohn's disease and  no prior cardiac history, admitted for evaluation of severe left shoulder pain with slight radiation to the left chest.   Cardiac enzymes negative x3.   Subjective: Currently pain free. No issues overnight.   Objective: Vital signs in last 24 hours: Temp:  [97.2 F (36.2 C)-98.1 F (36.7 C)] 98.1 F (36.7 C) (08/03 0531) Pulse Rate:  [69-99] 84 (08/03 0531) Resp:  [15-22] 15 (08/03 0531) BP: (109-139)/(62-81) 109/62 mmHg (08/03 0531) SpO2:  [96 %-100 %] 96 % (08/03 0531) Weight:  [150 lb (68.04 kg)] 150 lb (68.04 kg) (08/02 1452) Last BM Date: 07/08/14  Intake/Output from previous day: 08/02 0701 - 08/03 0700 In: 356 [P.O.:240; I.V.:116] Out: 802 [Urine:800; Stool:2] Intake/Output this shift:    Medications Current Facility-Administered Medications  Medication Dose Route Frequency Provider Last Rate Last Dose  . acetaminophen (TYLENOL) tablet 650 mg  650 mg Oral Q4H PRN Jacolyn Reedy, MD      . albuterol (PROVENTIL) (2.5 MG/3ML) 0.083% nebulizer solution 3 mL  3 mL Inhalation Q6H PRN Jacolyn Reedy, MD      . aspirin chewable tablet 324 mg  324 mg Oral NOW Jacolyn Reedy, MD       Or  . aspirin suppository 300 mg  300 mg Rectal NOW Jacolyn Reedy, MD      . aspirin EC tablet 81 mg  81 mg Oral Daily Jacolyn Reedy, MD      . dicyclomine (BENTYL) 10 MG/5ML syrup 10 mg  10 mg Oral BID WC Jacolyn Reedy, MD   10 mg at 07/08/14 2210  . diphenhydrAMINE (BENADRYL) capsule 25 mg  25 mg Oral Q6H PRN Jacolyn Reedy, MD      . diphenoxylate-atropine (LOMOTIL) 2.5-0.025 MG per tablet 1 tablet  1 tablet Oral QID PRN Jacolyn Reedy, MD      . heparin ADULT infusion 100 units/mL (25000 units/250 mL)  1,000 Units/hr Intravenous Continuous Rogue Bussing, [facility] 10 mL/hr at 07/09/14 0304 1,000 Units/hr at 07/09/14 0304  . HYDROcodone-acetaminophen (NORCO) 7.5-325 MG per tablet 1 tablet  1 tablet Oral Q6H PRN  Jacolyn Reedy, MD      . metoprolol tartrate (LOPRESSOR) tablet 25 mg  25 mg Oral BID Jacolyn Reedy, MD   25 mg at 07/08/14 2209  . multivitamin with minerals tablet 1 tablet  1 tablet Oral Daily Jacolyn Reedy, MD      . nitroGLYCERIN (NITROSTAT) SL tablet 0.4 mg  0.4 mg Sublingual Q5 Min x 3 PRN Jacolyn Reedy, MD      . ondansetron [facility]) injection 4 mg  4 mg Intravenous Q6H PRN Jacolyn Reedy, MD      . ondansetron [facility]) tablet 4 mg  4 mg Oral Q8H PRN Jacolyn Reedy, MD      . pantoprazole (PROTONIX) EC tablet 40 mg  40 mg Oral BID AC Jacolyn Reedy, MD      . potassium chloride SA (K-DUR,KLOR-CON) CR tablet 20 mEq  20 mEq Oral TID Jacolyn Reedy, MD   20 mEq at 07/08/14 2208  . simethicone (MYLICON) chewable tablet 80 mg  80 mg Oral Q6H PRN Jacolyn Reedy, MD      . spironolactone (ALDACTONE) tablet 50 mg  50 mg Oral Daily Jacolyn Reedy, MD      . triamcinolone cream (KENALOG) 0.1 %   Topical BID Gwyndolyn Saxon  Thurman Coyer, MD      . zolpidem Lorrin Mais) tablet 5 mg  5 mg Oral QHS PRN Jacolyn Reedy, MD   5 mg at 07/08/14 2208    PE: General appearance: alert, cooperative and no distress Neck: no carotid bruit and no JVD Lungs: clear to auscultation bilaterally Heart: regular rate and rhythm, S1, S2 normal, no murmur, click, rub or gallop Extremities: trace- 1+ pretibial edema L>R Pulses: 2+ and symmetric Skin: warm and dry Neurologic: Grossly normal  Lab Results:   Recent Labs  07/08/14 1454 07/08/14 2328 07/09/14 0105  WBC 14.3* 15.6* 16.2*  HGB 10.9* 9.5* 9.3*  HCT 34.6* 29.6* 29.2*  PLT 260 234 249   BMET  Recent Labs  07/08/14 1454 07/08/14 2328  NA 140 138  K 3.5* 3.7  CL 108 108  CO2 17* 16*  GLUCOSE 138* 114*  BUN 17 18  CREATININE 1.28* 1.19*  CALCIUM 9.1 8.2*   PT/INR No results found for this basename: LABPROT, INR,  in the last 72 hours Cholesterol  Recent Labs  07/09/14 0105  CHOL 172   Cardiac Panel (last 3  results)  Recent Labs  07/08/14 1755 07/08/14 2328 07/09/14 0526  TROPONINI <0.30 <0.30 <0.30    Assessment/Plan  Active Problems:   Unstable angina  1. Left Shoulder/Chest Pain: Currently Pain free. EKG with Twave changes. Cardiac enzymes  Negative x 3. However, with EKG changes, it may be best to proceed with LHC vs NST.  She has been NPO since midnight. MD to assess to determine plan regarding ischemic eval.  2. Anemia: Hgb 9.3. Continue to monitor    LOS: 1 day    Brittainy M. Ladoris Gene 07/09/2014 7:52 AM   Patient seen and examined. Agree with assessment and plan. Feels better on iv heparin.No recurrent discomfort.  ECG changes have improved with resolution of lateral T wave changes. Discussed options for further evaluation. With dynamic T changes will pursue definitive cardiac cath with possible PCI. Anemic; will f/u labs.   Troy Sine, MD, [facility] 07/09/2014 8:18 AM

## 2014-07-09 NOTE — Interval H&P Note (Signed)
History and Physical Interval Note:  2/0/9470 9:62 PM  Danielle [name]  has presented today for surgery, with the diagnosis of cp  The various methods of treatment have been discussed with the patient and family. After consideration of risks, benefits and other options for treatment, the patient has consented to  Procedure(s): LEFT HEART CATHETERIZATION WITH CORONARY ANGIOGRAM (N/A) as a surgical intervention .  The patient's history has been reviewed, patient examined, no change in status, stable for surgery.  I have reviewed the patient's chart and labs.  Questions were answered to the patient's satisfaction.     Collier Salina [facility] 07/09/2014 1:17 PM Cath Lab Visit (complete for each Cath Lab visit)  Clinical Evaluation Leading to the Procedure:   ACS: Yes.    Non-ACS:    Anginal Classification: CCS IV  Anti-ischemic medical therapy: No Therapy  Non-Invasive Test Results: No non-invasive testing performed  Prior CABG: No previous CABG

## 2014-07-09 NOTE — CV Procedure (Signed)
    Cardiac Catheterization Procedure Note  Name: Danielle [name] MRN: [id number] DOB: 07-23-1946  Procedure: Left Heart Cath, Selective Coronary Angiography, LV angiography  Indication: 68 yo BF with symptoms of acute left shoulder pain.   Procedural Details: The right wrist was prepped, draped, and anesthetized with 1% lidocaine. Using the modified Seldinger technique, a 6 French slender sheath was introduced into the right radial artery. 3 mg of verapamil was administered through the sheath, weight-based unfractionated heparin was administered intravenously. Standard Judkins catheters were used for selective coronary angiography and left ventriculography. Catheter exchanges were performed over an exchange length guidewire. There were no immediate procedural complications. A TR band was used for radial hemostasis at the completion of the procedure.  The patient was transferred to the post catheterization recovery area for further monitoring.  Procedural Findings: Hemodynamics: AO 118/64 mean 89 mm Hg LV 122/17 mm Hg  Coronary angiography: Coronary dominance: right  Left mainstem: Normal.  Left anterior descending (LAD): Mild calcification in the proximal vessel with <10% irregularity.  Left circumflex (LCx): Normal.  Right coronary artery (RCA): Normal.   Left ventriculography: Left ventricular systolic function is normal, LVEF is estimated at 55-65%, there is no significant mitral regurgitation   Final Conclusions:   1. No significant CAD 2. Normal LV function.  Recommendations: Medical management.  Tiffanee Mcnee [place], [place]  07/09/2014, 1:54 PM

## 2014-07-09 NOTE — Progress Notes (Signed)
ANTICOAGULATION CONSULT NOTE - Follow Up Consult  Pharmacy Consult for heparin Indication: chest pain/ACS  Labs:  Recent Labs  07/08/14 1454 07/08/14 1755 07/08/14 2328 07/09/14 0105  HGB 10.9*  --  9.5* 9.3*  HCT 34.6*  --  29.6* 29.2*  PLT 260  --  234 249  HEPARINUNFRC  --   --   --  0.30  CREATININE 1.28*  --  1.19*  --   TROPONINI  --  <0.30 <0.30  --     Assessment: 68yo female therapeutic on heparin after initial dosing for CP though at very low end of goal.  Goal of Therapy:  Heparin level 0.3-0.7 units/ml   Plan:  Will increase heparin slightly to 1000 units/hr and confirm with level in 6hr.  Wynona Neat, PharmD, BCPS  07/09/2014,3:10 AM

## 2014-07-09 NOTE — Progress Notes (Signed)
UR Completed Nikita [facility]ble Graves-Bigelow, RN,BSN [phone]  

## 2014-07-09 NOTE — Progress Notes (Signed)
Patient Profile:  68 y/o AAF with h/o severe Crohn's disease and  no prior cardiac history, admitted for evaluation of severe left shoulder pain with slight radiation to the left chest.   Cardiac enzymes negative x3.   Subjective: Currently pain free. No issues overnight.   Objective: Vital signs in last 24 hours: Temp:  [97.2 F (36.2 C)-98.1 F (36.7 C)] 98.1 F (36.7 C) (08/03 0531) Pulse Rate:  [69-99] 84 (08/03 0531) Resp:  [15-22] 15 (08/03 0531) BP: (109-139)/(62-81) 109/62 mmHg (08/03 0531) SpO2:  [96 %-100 %] 96 % (08/03 0531) Weight:  [150 lb (68.04 kg)] 150 lb (68.04 kg) (08/02 1452) Last BM Date: 07/08/14  Intake/Output from previous day: 08/02 0701 - 08/03 0700 In: 356 [P.O.:240; I.V.:116] Out: 802 [Urine:800; Stool:2] Intake/Output this shift:    Medications Current Facility-Administered Medications  Medication Dose Route Frequency Provider Last Rate Last Dose  . acetaminophen (TYLENOL) tablet 650 mg  650 mg Oral Q4H PRN Jacolyn Reedy, MD      . albuterol (PROVENTIL) (2.5 MG/3ML) 0.083% nebulizer solution 3 mL  3 mL Inhalation Q6H PRN Jacolyn Reedy, MD      . aspirin chewable tablet 324 mg  324 mg Oral NOW Jacolyn Reedy, MD       Or  . aspirin suppository 300 mg  300 mg Rectal NOW Jacolyn Reedy, MD      . aspirin EC tablet 81 mg  81 mg Oral Daily Jacolyn Reedy, MD      . dicyclomine (BENTYL) 10 MG/5ML syrup 10 mg  10 mg Oral BID WC Jacolyn Reedy, MD   10 mg at 07/08/14 2210  . diphenhydrAMINE (BENADRYL) capsule 25 mg  25 mg Oral Q6H PRN Jacolyn Reedy, MD      . diphenoxylate-atropine (LOMOTIL) 2.5-0.025 MG per tablet 1 tablet  1 tablet Oral QID PRN Jacolyn Reedy, MD      . heparin ADULT infusion 100 units/mL (25000 units/250 mL)  1,000 Units/hr Intravenous Continuous Rogue Bussing, [facility] 10 mL/hr at 07/09/14 0304 1,000 Units/hr at 07/09/14 0304  . HYDROcodone-acetaminophen (NORCO) 7.5-325 MG per tablet 1 tablet  1 tablet Oral Q6H PRN  Jacolyn Reedy, MD      . metoprolol tartrate (LOPRESSOR) tablet 25 mg  25 mg Oral BID Jacolyn Reedy, MD   25 mg at 07/08/14 2209  . multivitamin with minerals tablet 1 tablet  1 tablet Oral Daily Jacolyn Reedy, MD      . nitroGLYCERIN (NITROSTAT) SL tablet 0.4 mg  0.4 mg Sublingual Q5 Min x 3 PRN Jacolyn Reedy, MD      . ondansetron [facility]) injection 4 mg  4 mg Intravenous Q6H PRN Jacolyn Reedy, MD      . ondansetron [facility]) tablet 4 mg  4 mg Oral Q8H PRN Jacolyn Reedy, MD      . pantoprazole (PROTONIX) EC tablet 40 mg  40 mg Oral BID AC Jacolyn Reedy, MD      . potassium chloride SA (K-DUR,KLOR-CON) CR tablet 20 mEq  20 mEq Oral TID Jacolyn Reedy, MD   20 mEq at 07/08/14 2208  . simethicone (MYLICON) chewable tablet 80 mg  80 mg Oral Q6H PRN Jacolyn Reedy, MD      . spironolactone (ALDACTONE) tablet 50 mg  50 mg Oral Daily Jacolyn Reedy, MD      . triamcinolone cream (KENALOG) 0.1 %   Topical BID Gwyndolyn Saxon  Thurman Coyer, MD      . zolpidem Lorrin Mais) tablet 5 mg  5 mg Oral QHS PRN Jacolyn Reedy, MD   5 mg at 07/08/14 2208    PE: General appearance: alert, cooperative and no distress Neck: no carotid bruit and no JVD Lungs: clear to auscultation bilaterally Heart: regular rate and rhythm, S1, S2 normal, no murmur, click, rub or gallop Extremities: trace- 1+ pretibial edema L>R Pulses: 2+ and symmetric Skin: warm and dry Neurologic: Grossly normal  Lab Results:   Recent Labs  07/08/14 1454 07/08/14 2328 07/09/14 0105  WBC 14.3* 15.6* 16.2*  HGB 10.9* 9.5* 9.3*  HCT 34.6* 29.6* 29.2*  PLT 260 234 249   BMET  Recent Labs  07/08/14 1454 07/08/14 2328  NA 140 138  K 3.5* 3.7  CL 108 108  CO2 17* 16*  GLUCOSE 138* 114*  BUN 17 18  CREATININE 1.28* 1.19*  CALCIUM 9.1 8.2*   PT/INR No results found for this basename: LABPROT, INR,  in the last 72 hours Cholesterol  Recent Labs  07/09/14 0105  CHOL 172   Cardiac Panel (last 3  results)  Recent Labs  07/08/14 1755 07/08/14 2328 07/09/14 0526  TROPONINI <0.30 <0.30 <0.30    Assessment/Plan  Active Problems:   Unstable angina  1. Left Shoulder/Chest Pain: Currently Pain free. EKG with Twave changes. Cardiac enzymes  Negative x 3. However, with EKG changes, it may be best to proceed with LHC vs NST.  She has been NPO since midnight. MD to assess to determine plan regarding ischemic eval.  2. Anemia: Hgb 9.3. Continue to monitor    LOS: 1 day    Brittainy M. Ladoris Gene 07/09/2014 7:52 AM   Patient seen and examined. Agree with assessment and plan. Feels better on iv heparin.No recurrent discomfort.  ECG changes have improved with resolution of lateral T wave changes. Discussed options for further evaluation. With dynamic T changes will pursue definitive cardiac cath with possible PCI. Anemic; will f/u labs.   Troy Sine, MD, [facility] 07/09/2014 8:18 AM

## 2014-07-09 NOTE — Progress Notes (Signed)
ANTICOAGULATION CONSULT NOTE - Follow Up Consult  Pharmacy Consult for heparin Indication: chest pain/ACS  Labs:  Recent Labs  07/08/14 1454 07/08/14 1755 07/08/14 2328 07/09/14 0105 07/09/14 0526 07/09/14 0912  HGB 10.9*  --  9.5* 9.3*  --  9.9*  HCT 34.6*  --  29.6* 29.2*  --  31.4*  PLT 260  --  234 249  --  264  LABPROT  --   --   --   --   --  13.2  INR  --   --   --   --   --  1.00  HEPARINUNFRC  --   --   --  0.30  --  0.24*  CREATININE 1.28*  --  1.19*  --   --  1.19*  TROPONINI  --  <0.30 <0.30  --  <0.30  --     Assessment: 68yo female therapeutic on heparin for ACS. Heparin level is subtherapeutic this morning.  HGb 9.9 low stable, plts wnl. No overt bleeding.  Goal of Therapy:  Heparin level 0.3-0.7 units/ml    Plan: Increase heparin gtt to 1150 units/hr Daily HL, CBC F/u plans for cath - may need HL re-check this evening   Hughes Better, PharmD, BCPS Clinical Pharmacist Pager: [phone] 07/09/2014 11:22 AM

## 2014-07-10 DIAGNOSIS — R0789 Other chest pain: Secondary | ICD-10-CM

## 2014-07-10 DIAGNOSIS — M25519 Pain in unspecified shoulder: Secondary | ICD-10-CM

## 2014-07-10 DIAGNOSIS — I200 Unstable angina: Principal | ICD-10-CM

## 2014-07-10 MED ORDER — POTASSIUM CHLORIDE CRYS ER 20 MEQ PO TBCR
40.0000 meq | EXTENDED_RELEASE_TABLET | Freq: Once | ORAL | Status: AC
  Administered 2014-07-10: 40 meq via ORAL
  Filled 2014-07-10: qty 2

## 2014-07-10 MED ORDER — ASPIRIN 81 MG PO TBEC: 81.0000 mg | DELAYED_RELEASE_TABLET | Freq: Every day | ORAL | Status: DC

## 2014-07-10 NOTE — Care Management Note (Signed)
    Page 1 of 1   07/10/2014     12:05:41 PM CARE MANAGEMENT NOTE 07/10/2014  Patient:  Danielle [name], Danielle [name]   Account Number:  [id number]  Date Initiated:  07/10/2014  Documentation initiated by:  GRAVES-BIGELOW,Katelen Luepke  Subjective/Objective Assessment:   Pt admitted for chest pain.     Action/Plan:   No needs from CM at this time.   Anticipated DC Date:  07/10/2014   Anticipated DC Plan:  [place]  CM consult      Choice offered to / List presented to:             Status of service:  Completed, signed off Medicare Important Message given?  YES (If response is "NO", the following Medicare IM given date fields will be blank) Date Medicare IM given:  07/10/2014 Medicare IM given by:  GRAVES-BIGELOW,Phillip Sandler Date Additional Medicare IM given:   Additional Medicare IM given by:    Discharge Disposition:  HOME/SELF CARE  Per UR Regulation:  Reviewed for med. necessity/level of care/duration of stay  If discussed at [place] of Stay Meetings, dates discussed:    Comments:

## 2014-07-10 NOTE — Discharge Instructions (Signed)
PLEASE REMEMBER TO BRING ALL OF YOUR MEDICATIONS TO EACH OF YOUR FOLLOW-UP OFFICE VISITS. ° °PLEASE ATTEND ALL SCHEDULED FOLLOW-UP APPOINTMENTS.  ° °Activity: Increase activity slowly as tolerated. You may shower, but no soaking baths (or swimming) for 1 week. No driving for 2 days. No lifting over 5 lbs for 1 week. No sexual activity for 1 week.  ° °You May Return to Work: in 1 week (if applicable) ° °Wound Care: You may wash cath site gently with soap and water. Keep cath site clean and dry. If you notice pain, swelling, bleeding or pus at your cath site, please call [phone]. ° ° ° °Cardiac Cath Site Care °Refer to this sheet in the next few weeks. These instructions provide you with information on caring for yourself after your procedure. Your caregiver may also give you more specific instructions. Your treatment has been planned according to current medical practices, but problems sometimes occur. Call your caregiver if you have any problems or questions after your procedure. °HOME CARE INSTRUCTIONS °· You may shower 24 hours after the procedure. Remove the bandage (dressing) and gently wash the site with plain soap and water. Gently pat the site dry.  °· Do not apply powder or lotion to the site.  °· Do not sit in a bathtub, swimming pool, or whirlpool for 5 to 7 days.  °· No bending, squatting, or lifting anything over 10 pounds (4.5 kg) as directed by your caregiver.  °· Inspect the site at least twice daily.  °· Do not drive home if you are discharged the same day of the procedure. Have someone else drive you.  °· You may drive 24 hours after the procedure unless otherwise instructed by your caregiver.  °What to expect: °· Any bruising will usually fade within 1 to 2 weeks.  °· Blood that collects in the tissue (hematoma) may be painful to the touch. It should usually decrease in size and tenderness within 1 to 2 weeks.  °SEEK IMMEDIATE MEDICAL CARE IF: °· You have unusual pain at the site or down the  affected limb.  °· You have redness, warmth, swelling, or pain at the site.  °· You have drainage (other than a small amount of blood on the dressing).  °· You have chills.  °· You have a fever or persistent symptoms for more than 72 hours.  °· You have a fever and your symptoms suddenly get worse.  °· Your leg becomes pale, cool, tingly, or numb.  °· You have heavy bleeding from the site. Hold pressure on the site.  °Document Released: 12/26/2010 Document Revised: 11/12/2011 Document Reviewed: 12/26/2010 °ExitCare® Patient Information ©2012 [employer]. ° °

## 2014-07-10 NOTE — Discharge Summary (Signed)
CARDIOLOGY DISCHARGE SUMMARY   Patient ID: Danielle [name] MRN: [id number] DOB/AGE: February 12, 1946 68 y.o.  Admit date: 07/08/2014 Discharge date: 07/10/2014  PCP: Maggie Font, MD Primary Cardiologist: Dr. Claiborne Billings  Primary Discharge Diagnosis:  Shoulder pain, left; possible anginal equivalent Secondary Discharge Diagnosis:    Crohn's disease   GERD (gastroesophageal reflux disease)   Anemia   Unstable angina   Hypokalemia  Procedures: Cardiac catheterization, coronary arteriogram, left ventriculogram, two-view chest x-ray  Hospital Course: [name] is a 68 y.o. female with no history of CAD. She had onset of severe left shoulder pain that radiated to her chest. When it did not resolve, she came to the hospital where she was admitted for further evaluation and treatment.  She has a history of Crohn's disease with chronic diarrhea. She had some episodes of diarrhea and after that her symptoms resolved. Her white count was noted to be elevated but she had completed a steroid taper that morning, and had no signs or symptoms of infection. Her chest x-ray was reviewed and showed no acute disease. A TSH was within normal limits. She has a history of anemia, but her blood counts were consistent with previous values and her blood count did not drop during her admission. She was anticoagulated with heparin and tolerated this well.  Her shoulder pain and chest pain were concerning for unstable anginal pain since they started with minimal activity. Her cardiac enzymes were negative for MI, but cardiac catheterization was performed on 08/03 to further define her anatomy.  The cardiac catheterization results are below. She had minimal coronary artery disease with a 10% stenosis in the LAD. Her EF is preserved with no wall motion abnormalities.  On 08/04, she was seen by Dr. Claiborne Billings and all data were reviewed. She had mild hypokalemia and takes potassium chronically at home. Her potassium was  supplemented. Her chest and shoulder symptoms had resolved. Her GI issues were under control. She had no new medical problems or concerns. No further inpatient workup is indicated and she is considered stable for discharge, to followup with Dr. Berdine Addison and with Dr. Claiborne Billings as needed.  Labs:   Lab Results  Component Value Date   WBC 14.6* 07/09/2014   HGB 9.9* 07/09/2014   HCT 31.4* 07/09/2014   MCV 89.0 07/09/2014   PLT 264 07/09/2014     Recent Labs Lab 07/08/14 2328 07/09/14 0912  NA 138 142  K 3.7 3.6*  CL 108 110  CO2 16* 19  BUN 18 17  CREATININE 1.19* 1.19*  CALCIUM 8.2* 8.4  PROT 6.3  --   BILITOT <0.2*  --   ALKPHOS 112  --   ALT 22  --   AST 18  --   GLUCOSE 114* 80    Recent Labs  07/08/14 1755 07/08/14 2328 07/09/14 0526  TROPONINI <0.30 <0.30 <0.30   Lipid Panel     Component Value Date/Time   CHOL 172 07/09/2014 0105   TRIG 68 07/09/2014 0105   HDL 104 07/09/2014 0105   CHOLHDL 1.7 07/09/2014 0105   VLDL 14 07/09/2014 0105   LDLCALC 54 07/09/2014 0105    Pro B Natriuretic peptide (BNP)  Date/Time Value Ref Range Status  07/08/2014  2:54 PM 210.7* 0 - 125 pg/mL Final  03/20/2014 10:52 AM 255.5* 0 - 125 pg/mL Final    Recent Labs  07/09/14 0912  INR 1.00      Radiology: Dg Chest 2 View (if Patient Has Fever  And/or Copd) 07/08/2014   CLINICAL DATA:  Chest and shoulder pain  EXAM: CHEST  2 VIEW  COMPARISON:  03/20/2014  FINDINGS: A large hiatal hernia is again identified. The cardiac shadow is stable. The lungs are well-aerated without focal infiltrate or sizable effusion. No bony abnormality is seen. Old rib fractures are again seen on the right.  IMPRESSION: No active cardiopulmonary disease.   Electronically Signed   By: Inez Catalina M.D.   On: 07/08/2014 15:38   Cardiac Cath: 07/09/2014 Left mainstem: Normal.  Left anterior descending (LAD): Mild calcification in the proximal vessel with <10% irregularity.  Left circumflex (LCx): Normal.  Right coronary artery (RCA):  Normal.  Left ventriculography: Left ventricular systolic function is normal, LVEF is estimated at 55-65%, there is no significant mitral regurgitation  Final Conclusions:  1. No significant CAD  2. Normal LV function.  Recommendations: Medical management.  EKG: 07/09/2014 Sinus rhythm Vent. rate 76 BPM PR interval 104 ms QRS duration 84 ms QT/QTc 372/418 ms P-R-T axes 34 -12 -2  FOLLOW UP PLANS AND APPOINTMENTS Allergies  Allergen Reactions  . Remicade [Infliximab] Rash    Per Patient - she has developed a rash. Being seen by Dr. Flisher/Dermatology @ [facility].   . Zithromax [Azithromycin] Swelling    Patient states that she has a swollen mouth/ blisters, yeast present mouth and vaginia.  . Aspirin Other (See Comments)    Cramps her stomach  . Ciprofloxacin     unknown  . Sulfa Antibiotics Nausea And Vomiting     Medication List         albuterol 108 (90 BASE) MCG/ACT inhaler  Commonly known as:  PROVENTIL HFA;VENTOLIN HFA  Inhale 2 puffs into the lungs every 6 (six) hours as needed for wheezing.     aspirin 81 MG EC tablet  Take 1 tablet (81 mg total) by mouth daily.     CALCIUM + D PO  Take 1 tablet by mouth daily.     dicyclomine 10 MG/5ML syrup  Commonly known as:  BENTYL  Take 10 mg by mouth 2 (two) times daily with a meal.     diphenhydrAMINE 25 mg capsule  Commonly known as:  BENADRYL  Take 25 mg by mouth every 6 (six) hours as needed for itching.     diphenoxylate-atropine 2.5-0.025 MG per tablet  Commonly known as:  LOMOTIL  TAKE 1 TABLET BY MOUTH 3 TIMES A DAY AS NEEDED FOR DIARHEA OR LOOSE STOOLS.     HYDROcodone-acetaminophen 7.5-325 MG per tablet  Commonly known as:  NORCO  Take 1 tablet by mouth every 6 (six) hours as needed for moderate pain.     multivitamin with minerals Tabs tablet  Take 1 tablet by mouth daily.     nystatin cream  Commonly known as:  MYCOSTATIN  Apply 1 application topically 2 (two) times daily.     ondansetron 4 MG  tablet  Commonly known as:  ZOFRAN  Take 4 mg by mouth every 8 (eight) hours as needed for nausea.     pantoprazole 40 MG tablet  Commonly known as:  PROTONIX  Take 1 tablet (40 mg total) by mouth 2 (two) times daily before a meal.     potassium chloride SA 20 MEQ tablet  Commonly known as:  K-DUR,KLOR-CON  Take 20 mEq by mouth 3 (three) times daily.     predniSONE 10 MG tablet  Commonly known as:  DELTASONE  Take 10 mg by mouth See admin instructions.  Taper 6 tablets down every three days.Otho Darner of 71. Patient states she completed course of medication today (07-08-14)     spironolactone 50 MG tablet  Commonly known as:  ALDACTONE  Take 50 mg by mouth daily.     triamcinolone cream 0.1 %  Commonly known as:  KENALOG  Apply 1 application topically 2 (two) times daily.     VITAMIN B-12 IJ  Inject 1 each as directed every 30 (thirty) days. Around the 1st week of each month     zolpidem 10 MG tablet  Commonly known as:  AMBIEN  Take 10 mg by mouth at bedtime as needed for sleep.        Discharge Instructions   Diet - low sodium heart healthy    Complete by:  As directed      Increase activity slowly    Complete by:  As directed           Follow-up Information   Schedule an appointment as soon as possible for a visit with Maggie Font, MD.   Specialty:  Family Medicine   Contact information:   [address] [place] [address] [phone]       BRING ALL MEDICATIONS WITH YOU TO FOLLOW UP APPOINTMENTS  Time spent with patient to include physician time: 49 min Signed: Rosaria Ferries, PA-C 07/10/2014, 11:20 AM Co-Sign MD

## 2014-07-10 NOTE — Progress Notes (Signed)
Pt discharged home with family.  Reviewed discharge instructions and education, all questions answered.  Assessment unchanged from earlier.  

## 2014-07-10 NOTE — Progress Notes (Signed)
Patient Name: Danielle [name] Date of Encounter: 07/10/2014  Principal Problem:   Shoulder pain, left; possible anginal equivalent Active Problems:   Crohn's disease   GERD (gastroesophageal reflux disease)   Anemia   Unstable angina    Patient Profile: 68 yo female w/ hx severe Crohn's dz, psoriasis, recent UTI, anemia, remote PNA and FH CAD,(denies HTN, HLD, DM,TOB) was admitted 08/02 with left shoulder pain, ?anginal equiv. Ez neg MI. Cath 08/03 w/ LAD 10%, EF 55%.   SUBJECTIVE: No more chest pain, no SOB. Accepts that we don't have a good explanation for her pain. WBC likely up from steroids.  OBJECTIVE Filed Vitals:   07/09/14 1825 07/09/14 1855 07/09/14 2027 07/10/14 0542  BP: 110/59 104/58 101/57 115/66  Pulse: 85 89 86 88  Temp:   98.2 F (36.8 C) 98.2 F (36.8 C)  TempSrc:   Oral Oral  Resp:   18 18  Height:      Weight:    150 lb 2.1 oz (68.1 kg)  SpO2:   99% 97%    Intake/Output Summary (Last 24 hours) at 07/10/14 0816 Last data filed at 07/10/14 0544  Gross per 24 hour  Intake  790.8 ml  Output    700 ml  Net   90.8 ml   Filed Weights   07/08/14 1452 07/10/14 0542  Weight: 150 lb (68.04 kg) 150 lb 2.1 oz (68.1 kg)    PHYSICAL EXAM General: Well developed, well nourished, female in no acute distress. Head: Normocephalic, atraumatic.  Neck: Supple without bruits, JVD not elevated. Lungs:  Resp regular and unlabored, few rales bases. Heart: RRR, S1, S2, no S3, S4, or murmur; no rub. Abdomen: Soft, non-tender, non-distended, BS + x 4.  Extremities: No clubbing, cyanosis, no edema. Right radial cath site without ecchymosis/hematoma Neuro: Alert and oriented X 3. Moves all extremities spontaneously. Psych: Normal affect.  LABS: CBC:  Recent Labs  07/08/14 2328 07/09/14 0105 07/09/14 0912  WBC 15.6* 16.2* 14.6*  NEUTROABS 11.2*  --   --   HGB 9.5* 9.3* 9.9*  HCT 29.6* 29.2* 31.4*  MCV 88.9 88.5 89.0  PLT 234 249 264   INR:  Recent  Labs  07/09/14 0912  INR 2.35   Basic Metabolic Panel:  Recent Labs  07/08/14 2328 07/09/14 0912  NA 138 142  K 3.7 3.6*  CL 108 110  CO2 16* 19  GLUCOSE 114* 80  BUN 18 17  CREATININE 1.19* 1.19*  CALCIUM 8.2* 8.4   Liver Function Tests:  Recent Labs  07/08/14 2328  AST 18  ALT 22  ALKPHOS 112  BILITOT <0.2*  PROT 6.3  ALBUMIN 2.7*   Cardiac Enzymes:  Recent Labs  07/08/14 1755 07/08/14 2328 07/09/14 0526  TROPONINI <0.30 <0.30 <0.30    Recent Labs  07/08/14 1513  TROPIPOC 0.00   BNP: Pro B Natriuretic peptide (BNP)  Date/Time Value Ref Range Status  07/08/2014  2:54 PM 210.7* 0 - 125 pg/mL Final  03/20/2014 10:52 AM 255.5* 0 - 125 pg/mL Final   Fasting Lipid Panel:  Recent Labs  07/09/14 0105  CHOL 172  HDL 104  LDLCALC 54  TRIG 68  CHOLHDL 1.7   Thyroid Function Tests:  Recent Labs  07/08/14 1755  TSH 0.990    TELE:  SR, rare PVCs     Radiology/Studies: Dg Chest 2 View (if Patient Has Fever And/or Copd) 07/08/2014   CLINICAL DATA:  Chest and shoulder pain  EXAM:  CHEST  2 VIEW  COMPARISON:  03/20/2014  FINDINGS: A large hiatal hernia is again identified. The cardiac shadow is stable. The lungs are well-aerated without focal infiltrate or sizable effusion. No bony abnormality is seen. Old rib fractures are again seen on the right.  IMPRESSION: No active cardiopulmonary disease.   Electronically Signed   By: Inez Catalina M.D.   On: 07/08/2014 15:38     Current Medications:  . aspirin EC  81 mg Oral Daily  . dicyclomine  10 mg Oral BID WC  . metoprolol tartrate  25 mg Oral BID  . multivitamin with minerals  1 tablet Oral Daily  . pantoprazole  40 mg Oral BID AC  . potassium chloride SA  20 mEq Oral TID  . spironolactone  50 mg Oral Daily  . triamcinolone cream   Topical BID      ASSESSMENT AND PLAN: Principal Problem:   Shoulder pain, left; possible anginal equivalent - symptoms have resolved, cath and CXR w/out acute problem. No  further workup indicated. OK to d/c home and f/u with Dr. French Ana for noncardiac causes of chest pain.  Active Problems:   Crohn's disease - continue home rx, just finished steroid taper    GERD (gastroesophageal reflux disease) - on PPI BID    Anemia - chronic, per IM    Unstable angina - no significant CAD at cath, f/u w/ cardiology PRN.  Plan - d/c today.  Signed, Rosaria Ferries , PA-C 8:16 AM 07/10/2014   Patient seen and examined. Agree with assessment and plan. No recurrent symptoms. Cath site stable. DC today.   Troy Sine, MD, [facility] 07/10/2014 8:37 AM

## 2014-07-12 ENCOUNTER — Other Ambulatory Visit (INDEPENDENT_AMBULATORY_CARE_PROVIDER_SITE_OTHER): Payer: Self-pay | Admitting: Internal Medicine

## 2014-07-12 NOTE — Telephone Encounter (Signed)
May refill per Dr.Rehman the patient needs to only take on an as needed basis.

## 2014-07-22 ENCOUNTER — Other Ambulatory Visit (INDEPENDENT_AMBULATORY_CARE_PROVIDER_SITE_OTHER): Payer: Self-pay | Admitting: Internal Medicine

## 2014-07-24 ENCOUNTER — Other Ambulatory Visit (INDEPENDENT_AMBULATORY_CARE_PROVIDER_SITE_OTHER): Payer: Self-pay | Admitting: Internal Medicine

## 2014-07-26 ENCOUNTER — Other Ambulatory Visit (INDEPENDENT_AMBULATORY_CARE_PROVIDER_SITE_OTHER): Payer: Self-pay | Admitting: Internal Medicine

## 2014-07-26 IMAGING — CT CT ABD-PELV W/O CM
2 of 4 series · 15 of 46 positions shown, 17 images · non-contrast
Comparison: 12/26/2012

CLINICAL DATA: Right-sided abdominal pain with nausea and diarrhea.
Prior cholecystectomy and appendectomy.  History of Crohn's
disease.

CT ABDOMEN AND PELVIS WITHOUT CONTRAST
TECHNIQUE: Multidetector CT imaging of the abdomen and pelvis was
performed following the standard protocol without intravenous
contrast.

[Series 2: abd/ pelvis 5.0 i30f 1 · axial · 0.67mm/px · z∈[+930,+1260]mm · 12 of 77 slices shown, 14 images]
[im 7/77  soft-tissue]
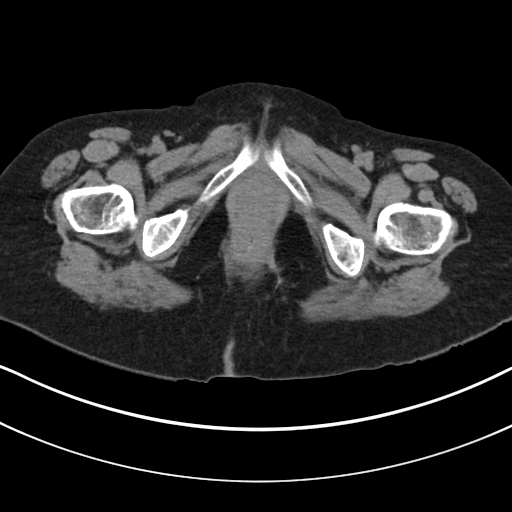
[im 7/77  bone]
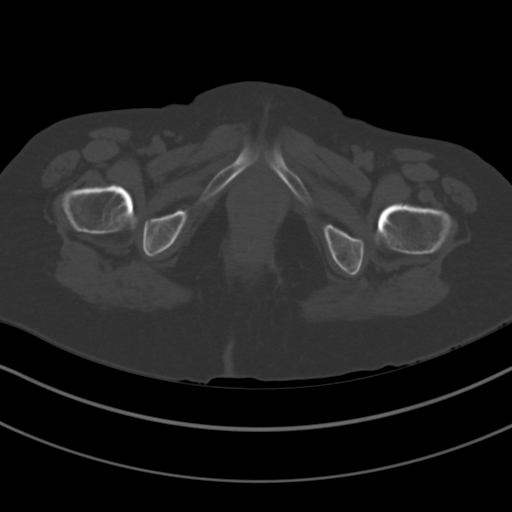
[im 13/77  soft-tissue]
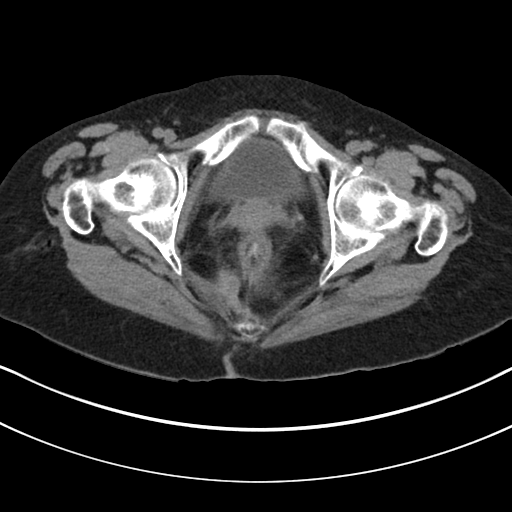
[im 19/77  soft-tissue]
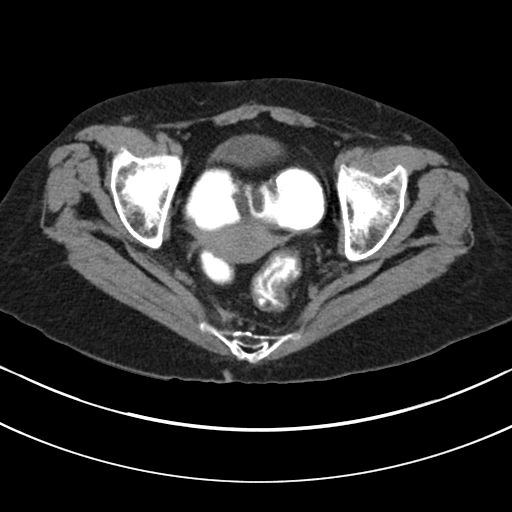
[im 25/77  soft-tissue]
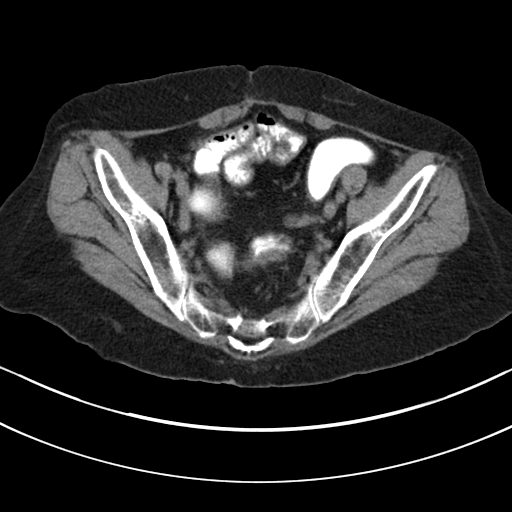
[im 31/77  soft-tissue]
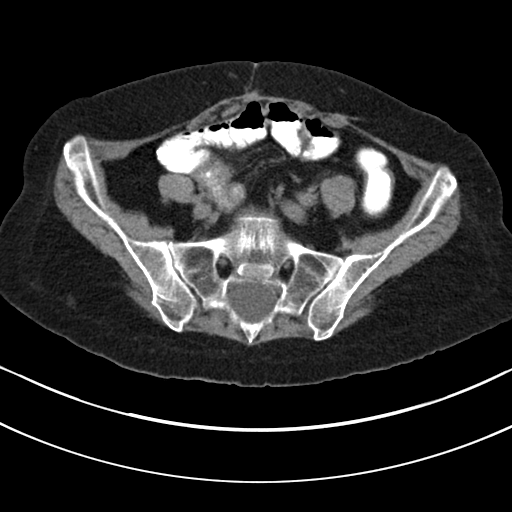
[im 37/77  soft-tissue]
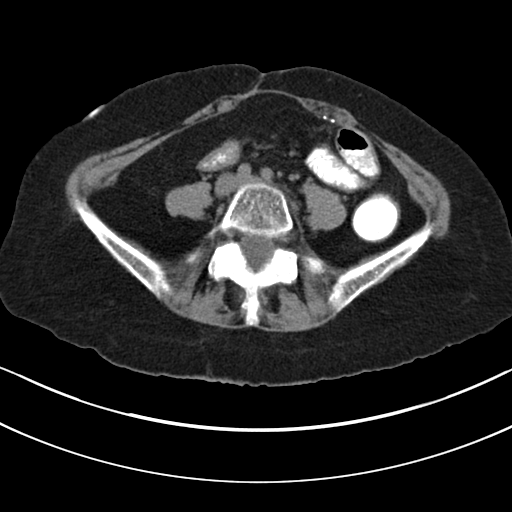
[im 43/77  soft-tissue]
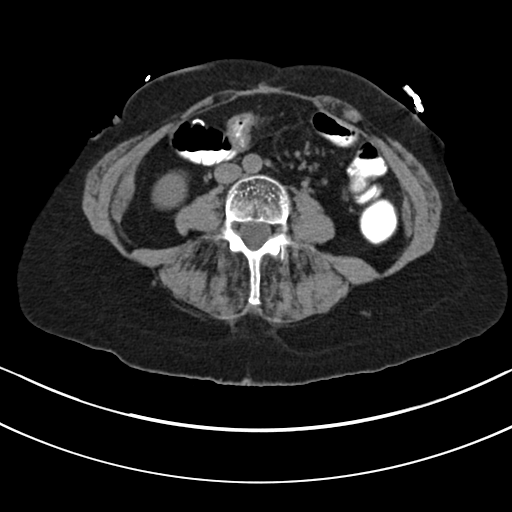
[im 49/77  soft-tissue]
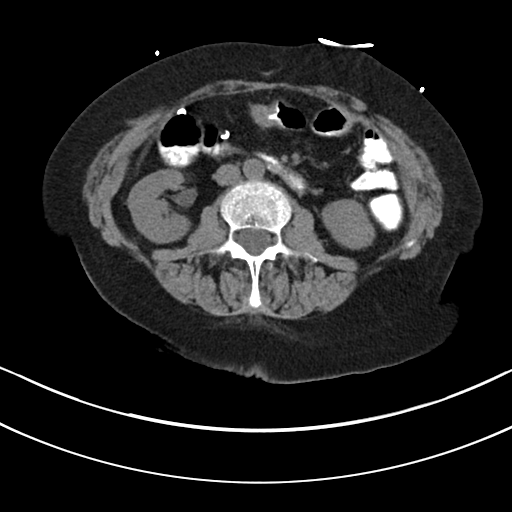
[im 55/77  soft-tissue]
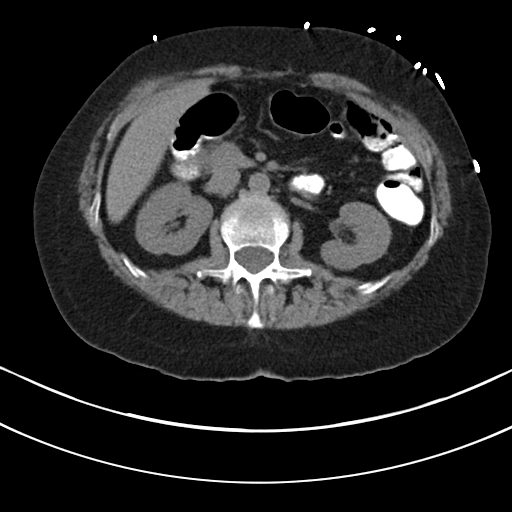
[im 55/77  bone]
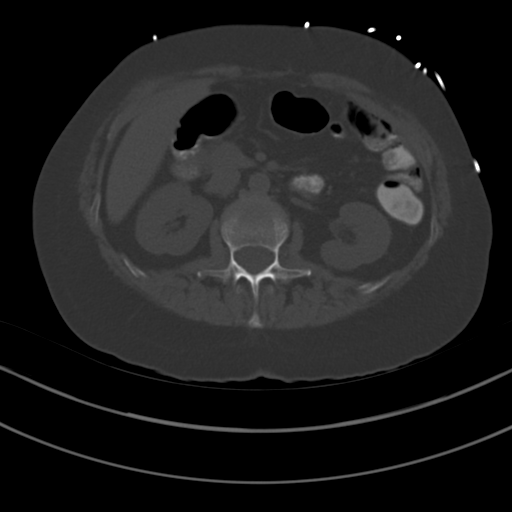
[im 61/77  soft-tissue]
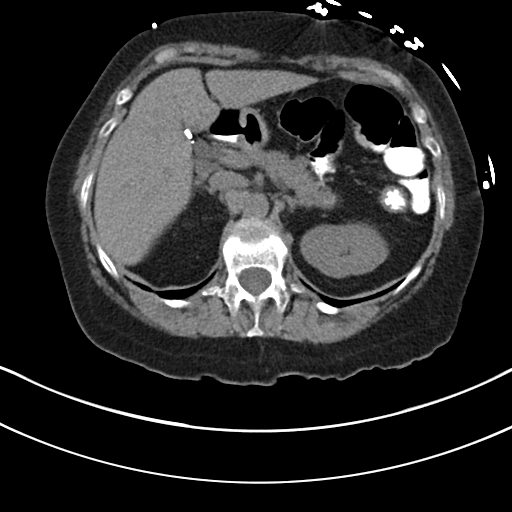
[im 67/77  soft-tissue]
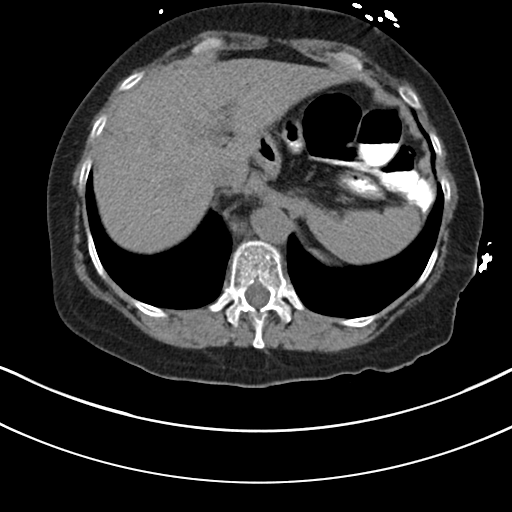
[im 73/77  soft-tissue]
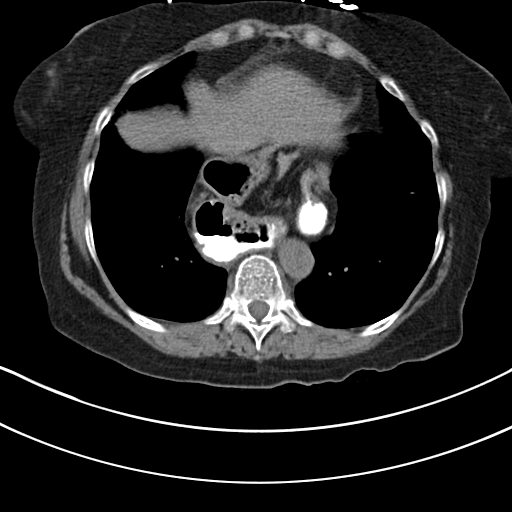

[Series 5: cor st · coronal · 0.52mm/px · 3 of 66 slices shown]
[im 22/66  soft-tissue]
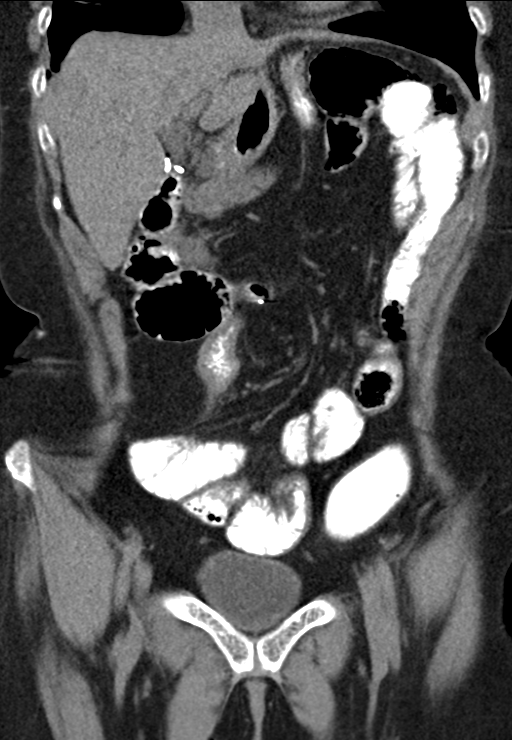
[im 29/66  soft-tissue]
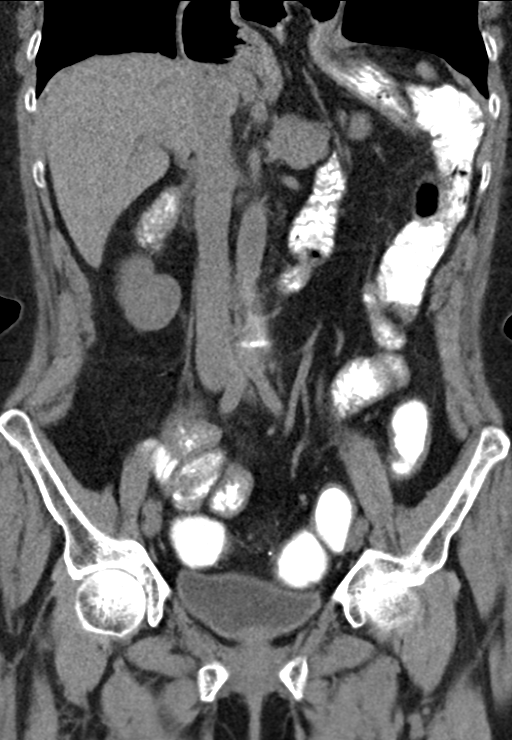
[im 37/66  soft-tissue]
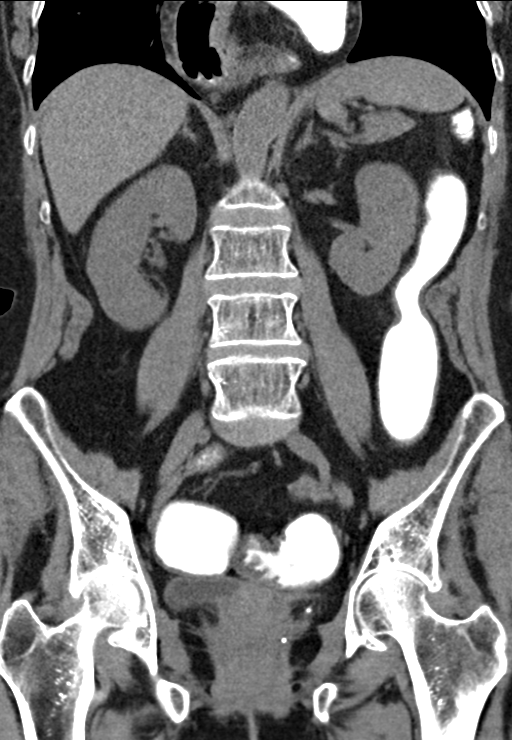

[15 of 46 positions shown; findings below may reference images not displayed]

FINDINGS: A very large hiatal hernia is noted and contains
proximally to thirds of the stomach and a segment of the transverse
colon.  Although incompletely visualized, there is no evidence for
edema or inflammation within the hernia sac.

Liver and spleen have normal uninfused features.  The duodenum,
pancreas, and adrenal glands are unremarkable.  Gallbladder is
surgically absent.

No stones are seen in either kidney.  No secondary changes in
either kidney or ureter.

No abdominal aortic aneurysm.  No lymphadenopathy in the abdomen.
No evidence for intraperitoneal free fluid.

Imaging through the pelvis shows no intraperitoneal free fluid.
There is no pelvic sidewall lymphadenopathy.

There is an abnormal tract of soft tissue beginning in the region
of the anal verge and tracking posterolaterally to the right into
the ischiorectal fat.  Imaging features could be related to a blind
ending fistula or linear abscess.  Abnormal soft tissue to the left
of the anus may be related to a fistula or scar related to an old
perianal fistula.

Bone windows reveal no worrisome lytic or sclerotic osseous
lesions.
IMPRESSION: Linear bands of soft tissue attenuation tracking to the right of
the rectum and left of the anus.  On the right, this could be a a
linear chronic perirectal linear abscess.  On the left, a peri-anal
fistula or scarring from previous fistula could have this
appearance.

## 2014-08-09 ENCOUNTER — Other Ambulatory Visit (INDEPENDENT_AMBULATORY_CARE_PROVIDER_SITE_OTHER): Payer: Self-pay | Admitting: Internal Medicine

## 2014-08-14 ENCOUNTER — Other Ambulatory Visit (INDEPENDENT_AMBULATORY_CARE_PROVIDER_SITE_OTHER): Payer: Self-pay | Admitting: Internal Medicine

## 2014-08-15 ENCOUNTER — Other Ambulatory Visit (INDEPENDENT_AMBULATORY_CARE_PROVIDER_SITE_OTHER): Payer: Self-pay | Admitting: Internal Medicine

## 2014-08-15 DIAGNOSIS — R110 Nausea: Secondary | ICD-10-CM

## 2014-08-15 MED ORDER — ONDANSETRON HCL 4 MG PO TABS: ORAL_TABLET | ORAL | Status: DC

## 2014-08-31 ENCOUNTER — Other Ambulatory Visit (INDEPENDENT_AMBULATORY_CARE_PROVIDER_SITE_OTHER): Payer: Self-pay | Admitting: Internal Medicine

## 2014-09-03 ENCOUNTER — Ambulatory Visit (INDEPENDENT_AMBULATORY_CARE_PROVIDER_SITE_OTHER): Payer: Medicare Other | Admitting: Internal Medicine

## 2014-09-03 ENCOUNTER — Encounter (INDEPENDENT_AMBULATORY_CARE_PROVIDER_SITE_OTHER): Payer: Self-pay | Admitting: Internal Medicine

## 2014-09-03 VITALS — BP 110/68 | HR 70 | Temp 97.7°F | Resp 16 | Ht 64.5 in | Wt 154.7 lb

## 2014-09-03 DIAGNOSIS — R197 Diarrhea, unspecified: Secondary | ICD-10-CM | POA: Diagnosis not present

## 2014-09-03 DIAGNOSIS — E876 Hypokalemia: Secondary | ICD-10-CM

## 2014-09-03 DIAGNOSIS — I200 Unstable angina: Secondary | ICD-10-CM

## 2014-09-03 DIAGNOSIS — K219 Gastro-esophageal reflux disease without esophagitis: Secondary | ICD-10-CM

## 2014-09-03 DIAGNOSIS — D6489 Other specified anemias: Secondary | ICD-10-CM

## 2014-09-03 DIAGNOSIS — K5090 Crohn's disease, unspecified, without complications: Secondary | ICD-10-CM | POA: Diagnosis not present

## 2014-09-03 DIAGNOSIS — K50919 Crohn's disease, unspecified, with unspecified complications: Secondary | ICD-10-CM

## 2014-09-03 MED ORDER — DIPHENOXYLATE-ATROPINE 2.5-0.025 MG PO TABS: 1.0000 | ORAL_TABLET | Freq: Four times a day (QID) | ORAL | Status: DC | PRN

## 2014-09-03 MED ORDER — NYSTATIN 100000 UNIT/GM EX CREA: TOPICAL_CREAM | Freq: Two times a day (BID) | CUTANEOUS | Status: DC | PRN

## 2014-09-03 NOTE — Progress Notes (Signed)
Presenting complaint;  Followup for Crohn's disease.  Subjective:  Patient is 68 year old African female who presents for scheduled visit accompanied by her sister. She was last seen 9 weeks ago. She's been off prednisone for about 3 weeks. She still has a rash over her legs but it's gradually getting better. When she was seen on 06/28/2014 she was having 3-4 soft stools per day. Now she is having 5-6 loose stools per day. She has noted perianal discomfort. She denies melena or rectal bleeding. She has intermittent lower abdominal pain. She takes pain medication per minute for leg pain and sometimes for abdominal pain. She says her appetite is very good. She denies fever chills nausea vomiting or heartburn. She needs refills for nystatin cream and Lomotil. She is wondering if serum potassium is dropped again because of diarrhea. She has not taken any antibiotics since her last visit. She does not believe she is having any side effects with Humira.   Current Medications: Outpatient Encounter Prescriptions as of 09/03/2014  Medication Sig  . albuterol (PROVENTIL HFA;VENTOLIN HFA) 108 (90 BASE) MCG/ACT inhaler Inhale 2 puffs into the lungs every 6 (six) hours as needed for wheezing.  [name] aspirin EC 81 MG EC tablet Take 1 tablet (81 mg total) by mouth daily.  . Calcium Carbonate-Vitamin D (CALCIUM + D PO) Take 1 tablet by mouth daily.  . Cyanocobalamin (VITAMIN B-12 IJ) Inject 1 each as directed every 30 (thirty) days. Around the 1st week of each month  . dicyclomine (BENTYL) 10 MG/5ML syrup TAKE 2 TEASPOONSFUL 3 TIMES A DAY BEFORE MEALS  . diphenhydrAMINE (BENADRYL) 25 mg capsule Take 25 mg by mouth every 6 (six) hours as needed for itching.  . diphenoxylate-atropine (LOMOTIL) 2.5-0.025 MG per tablet TAKE 1 TABLET BY MOUTH 3 TIMES A DAY AS NEEDED FOR DIARHEA OR LOOSE STOOLS.  [name] HUMIRA PEN 40 MG/0.8ML PNKT Inject 40 mg into the skin every 14 (fourteen) days.   [name] HYDROcodone-acetaminophen (NORCO)  7.5-325 MG per tablet Take 1 tablet by mouth every 6 (six) hours as needed for moderate pain.  . Multiple Vitamin (MULTIVITAMIN WITH MINERALS) TABS Take 1 tablet by mouth daily.  . ondansetron (ZOFRAN) 4 MG tablet TAKE 1 TABLET TWICE A DAY  . pantoprazole (PROTONIX) 40 MG tablet Take 1 tablet (40 mg total) by mouth 2 (two) times daily before a meal.  . potassium chloride SA (K-DUR,KLOR-CON) 20 MEQ tablet Take 20 mEq by mouth 3 (three) times daily.  [name] spironolactone (ALDACTONE) 50 MG tablet Take 50 mg by mouth daily.  [name] triamcinolone cream (KENALOG) 0.1 % Apply 1 application topically 2 (two) times daily.  [name] zolpidem (AMBIEN) 10 MG tablet Take 10 mg by mouth at bedtime as needed for sleep.   [name] nystatin cream (MYCOSTATIN) APPLY 1 APPLICATION TOPICALLY 2 (TWO) TIMES DAILY.  . [DISCONTINUED] methotrexate (RHEUMATREX) 2.5 MG tablet   . [DISCONTINUED] nystatin cream (MYCOSTATIN) Apply 1 application topically 2 (two) times daily.  . [DISCONTINUED] predniSONE (DELTASONE) 10 MG tablet Take 10 mg by mouth See admin instructions. Taper 6 tablets down every three days.Otho Darner of 70. Patient states she completed course of medication today (07-08-14)     Objective: Blood pressure 110/68, pulse 70, temperature 97.7 F (36.5 C), resp. rate 16, height 5' 4.5" (1.638 m), weight 154 lb 11.2 oz (70.171 kg). Patient is alert and in no acute distress. Conjunctiva is pink. Sclera is nonicteric Oropharyngeal mucosa is normal. No neck masses or thyromegaly noted. Cardiac exam with regular rhythm  normal S1 and S2. No murmur or gallop noted. Lungs are clear to auscultation. Abdomen is full. Bowel sounds are normal. On palpation abdomen is soft and nontender without organomegaly or masses.  She has macular rash over her legs and 1+ pitting edema. Edema slightly more on left ankle.  Labs/studies Results: Lab data from 07/09/2014   serum sodium 142, potassium 3.6, chloride 110, CO2 19, BUN 17, creatinine 1.19 and glucose  80.  serum calcium was 8.4.   WBC was 14.6, H&H 9.9 and 31.4 and platelet count 264K.    Assessment:  #1. History of small bowel and anorectal Crohn's disease. Patient is having more diarrhea than in the past. She is not having any other symptoms to suggest relapse which she could easily have. If diarrhea does not respond to symptomatic therapy may consider short-term budesonide. #2. Anemia. No evidence of GI bleed. #3. History of hypokalemia  #4. GERD. Symptoms well-controlled with therapy.  Plan:  Patient advised to stay on low residue diet for now. New prescription given for nystatin cream. Increase Lomotil to one tablet by mouth 4 times a day when necessary. Prescription given for 120 doses along with 2 refills. She will keep stool diary for the next 2 weeks. CBC, comprehensive chemistry panel and CRP. Office visit in 3 months.

## 2014-09-03 NOTE — Patient Instructions (Signed)
Physician Will call with results of blood tests. Keep stool diary for next two weeks. Stay on low residue diet for now

## 2014-09-05 ENCOUNTER — Other Ambulatory Visit (INDEPENDENT_AMBULATORY_CARE_PROVIDER_SITE_OTHER): Payer: Self-pay | Admitting: Internal Medicine

## 2014-09-05 IMAGING — CR DG ABDOMEN 2V
2 series · 2 of 2 positions shown · non-contrast
Comparison: CT 07/12/2013

CLINICAL DATA: Abdominal pain, diarrhea.

EXAM:
ABDOMEN - 2 VIEW

[w abdomen upright]
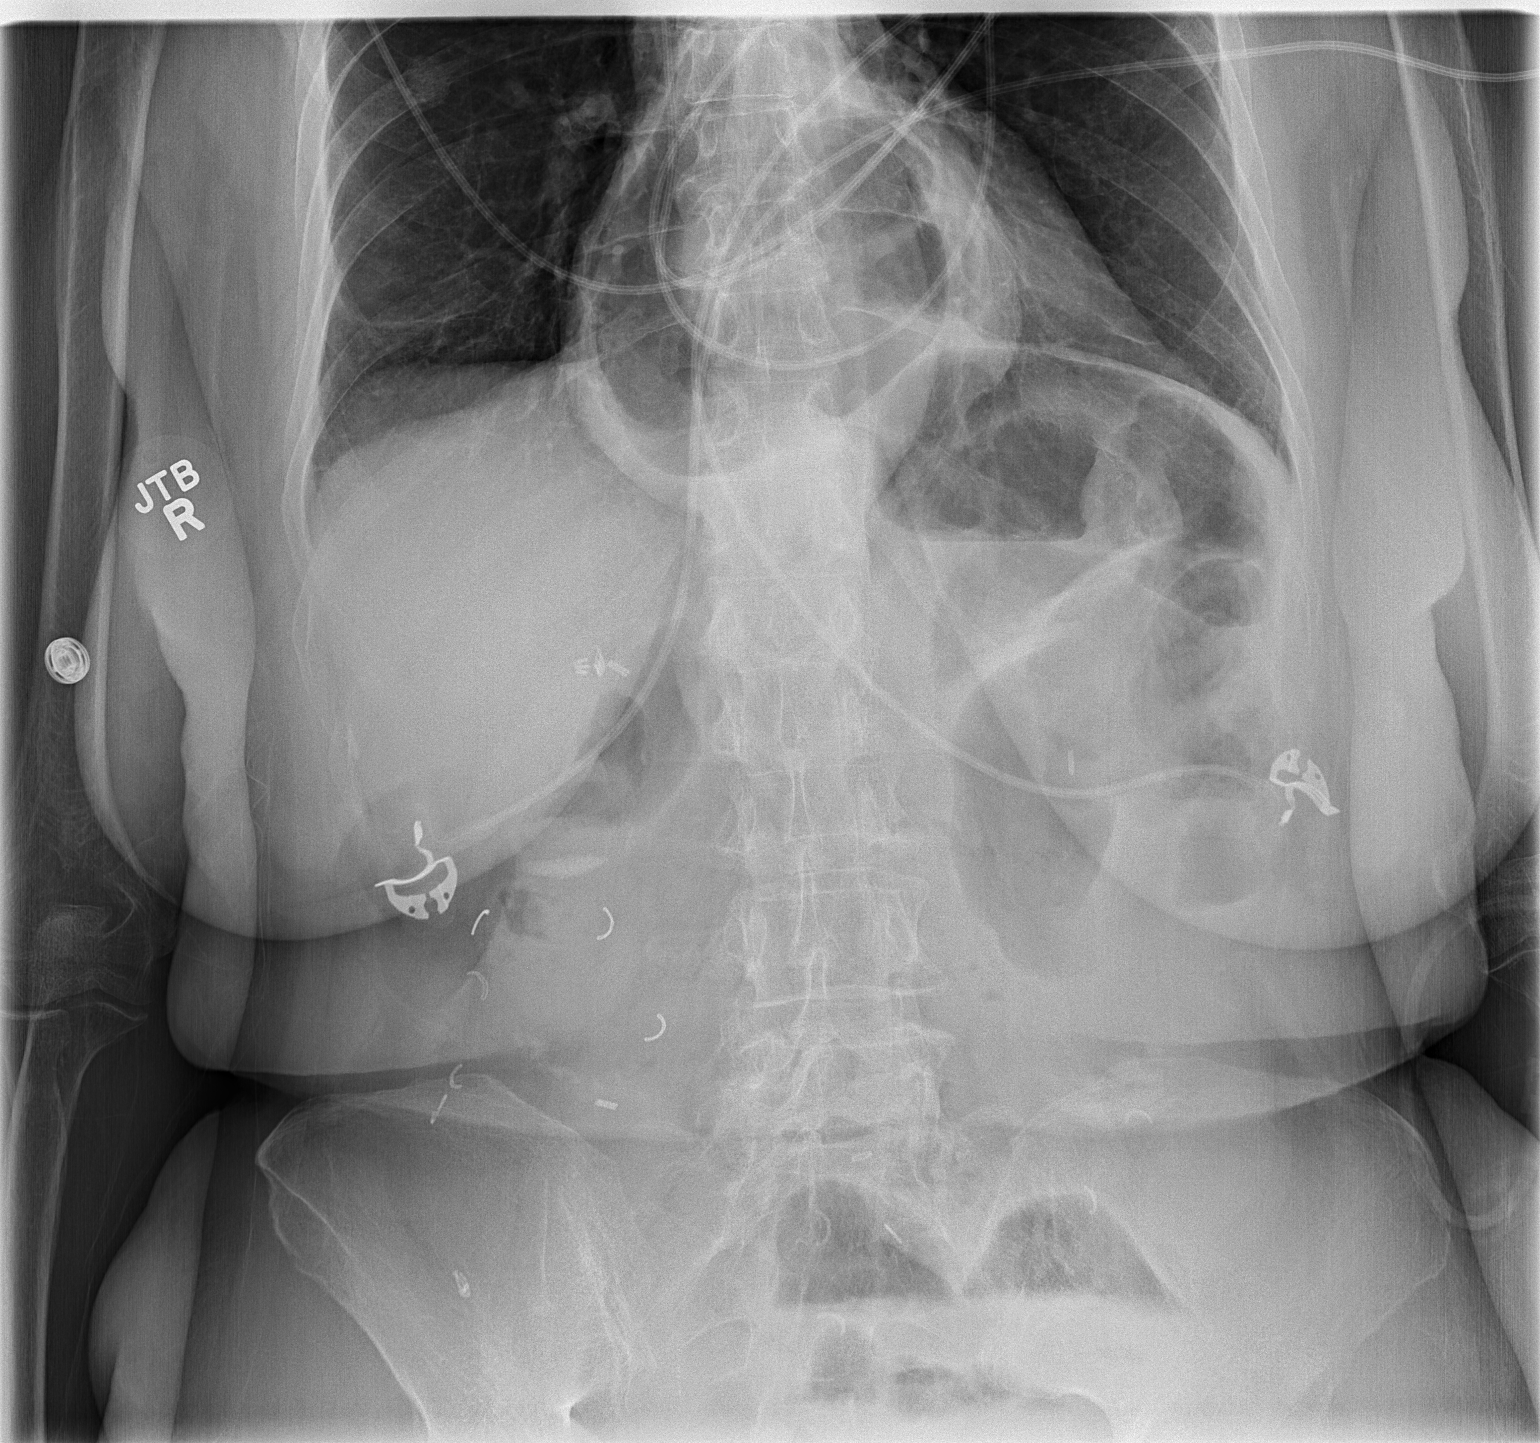

[t abdomen supine]
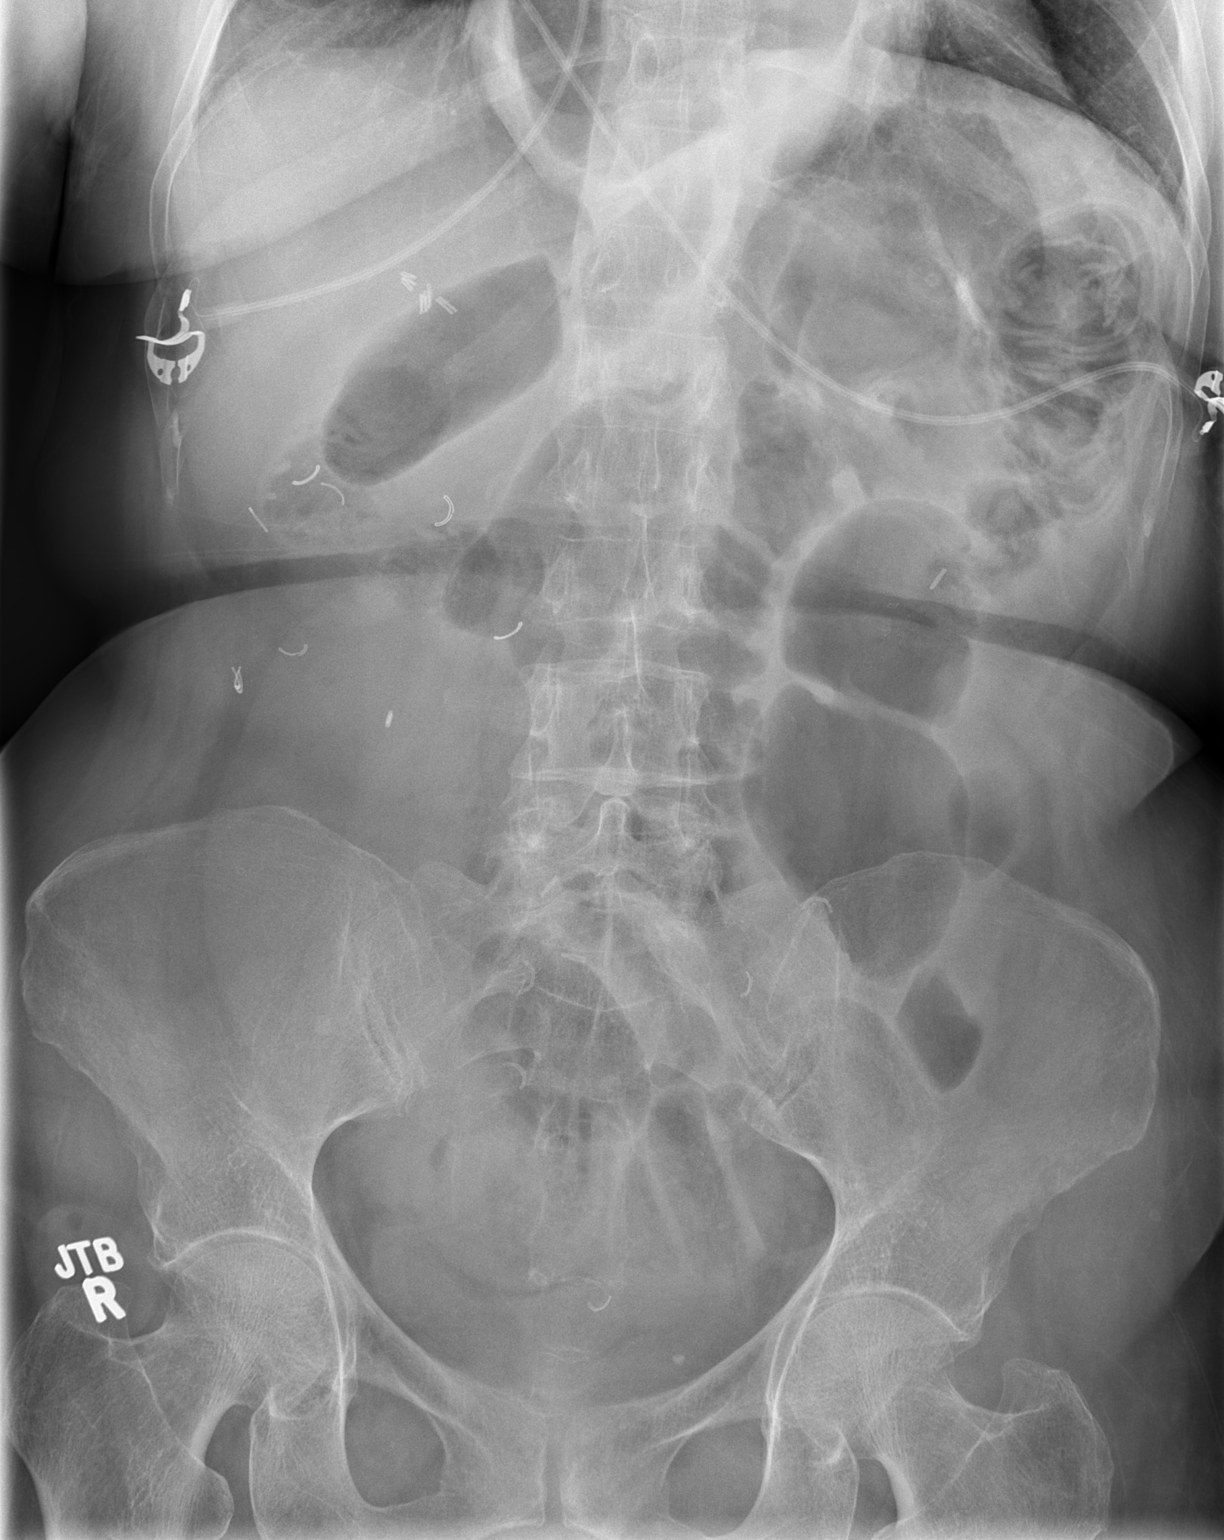

[2 of 2 positions shown; findings below may reference images not displayed]

FINDINGS: Large hiatal hernia. Moderate gaseous distention of the colon. No
small bowel dilatation. No free air organomegaly. Prior
cholecystectomy. Visualized lung bases are clear.
IMPRESSION: Mild gaseous distention of the colon.

Large hiatal hernia.

## 2014-09-18 ENCOUNTER — Other Ambulatory Visit (INDEPENDENT_AMBULATORY_CARE_PROVIDER_SITE_OTHER): Payer: Self-pay | Admitting: Internal Medicine

## 2014-09-21 DIAGNOSIS — Z23 Encounter for immunization: Secondary | ICD-10-CM | POA: Diagnosis not present

## 2014-09-25 ENCOUNTER — Other Ambulatory Visit (INDEPENDENT_AMBULATORY_CARE_PROVIDER_SITE_OTHER): Payer: Self-pay | Admitting: Internal Medicine

## 2014-09-28 ENCOUNTER — Encounter (INDEPENDENT_AMBULATORY_CARE_PROVIDER_SITE_OTHER): Payer: Self-pay | Admitting: *Deleted

## 2014-10-09 ENCOUNTER — Other Ambulatory Visit (INDEPENDENT_AMBULATORY_CARE_PROVIDER_SITE_OTHER): Payer: Self-pay | Admitting: Internal Medicine

## 2014-10-15 ENCOUNTER — Other Ambulatory Visit (INDEPENDENT_AMBULATORY_CARE_PROVIDER_SITE_OTHER): Payer: Self-pay | Admitting: Internal Medicine

## 2014-10-23 DIAGNOSIS — L600 Ingrowing nail: Secondary | ICD-10-CM | POA: Diagnosis not present

## 2014-10-24 DIAGNOSIS — M79674 Pain in right toe(s): Secondary | ICD-10-CM | POA: Diagnosis not present

## 2014-10-24 DIAGNOSIS — L03031 Cellulitis of right toe: Secondary | ICD-10-CM | POA: Diagnosis not present

## 2014-11-06 ENCOUNTER — Telehealth (INDEPENDENT_AMBULATORY_CARE_PROVIDER_SITE_OTHER): Payer: Self-pay | Admitting: *Deleted

## 2014-11-06 ENCOUNTER — Other Ambulatory Visit (INDEPENDENT_AMBULATORY_CARE_PROVIDER_SITE_OTHER): Payer: Self-pay | Admitting: Internal Medicine

## 2014-11-06 NOTE — Telephone Encounter (Signed)
Floree has no more refills on Zofran. She would like to get a new Rx sent to [facility] not [facility]. Would like to speak with [name] also, if she will please return her call at [phone].

## 2014-11-06 NOTE — Telephone Encounter (Signed)
The prescription has been sent to her Pharmacy.

## 2014-11-08 DIAGNOSIS — B353 Tinea pedis: Secondary | ICD-10-CM | POA: Diagnosis not present

## 2014-11-08 DIAGNOSIS — M79609 Pain in unspecified limb: Secondary | ICD-10-CM | POA: Diagnosis not present

## 2014-11-08 DIAGNOSIS — L03032 Cellulitis of left toe: Secondary | ICD-10-CM | POA: Diagnosis not present

## 2014-11-12 ENCOUNTER — Telehealth (INDEPENDENT_AMBULATORY_CARE_PROVIDER_SITE_OTHER): Payer: Self-pay | Admitting: *Deleted

## 2014-11-12 NOTE — Telephone Encounter (Signed)
Forgot to tell Danielle [name] she was also needing a new Rx for nystatin cream [facility] to [facility] [address].

## 2014-11-13 ENCOUNTER — Other Ambulatory Visit (INDEPENDENT_AMBULATORY_CARE_PROVIDER_SITE_OTHER): Payer: Self-pay | Admitting: Internal Medicine

## 2014-11-13 DIAGNOSIS — K50919 Crohn's disease, unspecified, with unspecified complications: Secondary | ICD-10-CM

## 2014-11-13 MED ORDER — NYSTATIN 100000 UNIT/GM EX CREA: TOPICAL_CREAM | Freq: Two times a day (BID) | CUTANEOUS | Status: DC | PRN

## 2014-11-13 NOTE — Telephone Encounter (Signed)
Prescription for the Nystatin has been sent to her [facility] per Dr.Rehman.

## 2014-11-13 NOTE — Telephone Encounter (Signed)
Refill has been sent to her pharmacy

## 2014-11-15 ENCOUNTER — Encounter (HOSPITAL_COMMUNITY): Payer: Self-pay | Admitting: Cardiology

## 2014-11-18 ENCOUNTER — Encounter (HOSPITAL_COMMUNITY): Payer: Self-pay | Admitting: *Deleted

## 2014-11-18 ENCOUNTER — Inpatient Hospital Stay (HOSPITAL_COMMUNITY)
Admission: EM | Admit: 2014-11-18 | Discharge: 2014-11-22 | DRG: 392 | Disposition: A | Discharge status: Home / Self Care | Payer: Medicare Other | Attending: Internal Medicine | Admitting: Internal Medicine

## 2014-11-18 DIAGNOSIS — Z9049 Acquired absence of other specified parts of digestive tract: Secondary | ICD-10-CM | POA: Diagnosis present

## 2014-11-18 DIAGNOSIS — E872 Acidosis, unspecified: Secondary | ICD-10-CM

## 2014-11-18 DIAGNOSIS — K449 Diaphragmatic hernia without obstruction or gangrene: Secondary | ICD-10-CM | POA: Diagnosis present

## 2014-11-18 DIAGNOSIS — K219 Gastro-esophageal reflux disease without esophagitis: Secondary | ICD-10-CM | POA: Diagnosis present

## 2014-11-18 DIAGNOSIS — L97919 Non-pressure chronic ulcer of unspecified part of right lower leg with unspecified severity: Secondary | ICD-10-CM | POA: Diagnosis present

## 2014-11-18 DIAGNOSIS — B961 Klebsiella pneumoniae [K. pneumoniae] as the cause of diseases classified elsewhere: Secondary | ICD-10-CM | POA: Diagnosis present

## 2014-11-18 DIAGNOSIS — E876 Hypokalemia: Secondary | ICD-10-CM | POA: Diagnosis present

## 2014-11-18 DIAGNOSIS — R197 Diarrhea, unspecified: Secondary | ICD-10-CM | POA: Diagnosis not present

## 2014-11-18 DIAGNOSIS — D649 Anemia, unspecified: Secondary | ICD-10-CM | POA: Diagnosis present

## 2014-11-18 DIAGNOSIS — Z9889 Other specified postprocedural states: Secondary | ICD-10-CM

## 2014-11-18 DIAGNOSIS — M2550 Pain in unspecified joint: Secondary | ICD-10-CM | POA: Diagnosis present

## 2014-11-18 DIAGNOSIS — R630 Anorexia: Secondary | ICD-10-CM | POA: Diagnosis present

## 2014-11-18 DIAGNOSIS — Z881 Allergy status to other antibiotic agents status: Secondary | ICD-10-CM

## 2014-11-18 DIAGNOSIS — E860 Dehydration: Secondary | ICD-10-CM | POA: Diagnosis present

## 2014-11-18 DIAGNOSIS — N390 Urinary tract infection, site not specified: Secondary | ICD-10-CM | POA: Diagnosis present

## 2014-11-18 DIAGNOSIS — L511 Stevens-Johnson syndrome: Secondary | ICD-10-CM | POA: Diagnosis present

## 2014-11-18 DIAGNOSIS — N179 Acute kidney failure, unspecified: Secondary | ICD-10-CM

## 2014-11-18 DIAGNOSIS — L97929 Non-pressure chronic ulcer of unspecified part of left lower leg with unspecified severity: Secondary | ICD-10-CM | POA: Diagnosis present

## 2014-11-18 DIAGNOSIS — Z882 Allergy status to sulfonamides status: Secondary | ICD-10-CM | POA: Diagnosis not present

## 2014-11-18 DIAGNOSIS — L409 Psoriasis, unspecified: Secondary | ICD-10-CM | POA: Diagnosis present

## 2014-11-18 DIAGNOSIS — R21 Rash and other nonspecific skin eruption: Secondary | ICD-10-CM | POA: Diagnosis present

## 2014-11-18 DIAGNOSIS — T363X5D Adverse effect of macrolides, subsequent encounter: Secondary | ICD-10-CM

## 2014-11-18 DIAGNOSIS — Z888 Allergy status to other drugs, medicaments and biological substances status: Secondary | ICD-10-CM

## 2014-11-18 DIAGNOSIS — Z79899 Other long term (current) drug therapy: Secondary | ICD-10-CM

## 2014-11-18 DIAGNOSIS — K5000 Crohn's disease of small intestine without complications: Secondary | ICD-10-CM | POA: Diagnosis not present

## 2014-11-18 DIAGNOSIS — K5090 Crohn's disease, unspecified, without complications: Secondary | ICD-10-CM

## 2014-11-18 DIAGNOSIS — R634 Abnormal weight loss: Secondary | ICD-10-CM | POA: Diagnosis present

## 2014-11-18 DIAGNOSIS — R112 Nausea with vomiting, unspecified: Secondary | ICD-10-CM | POA: Diagnosis not present

## 2014-11-18 DIAGNOSIS — K529 Noninfective gastroenteritis and colitis, unspecified: Secondary | ICD-10-CM | POA: Diagnosis present

## 2014-11-18 DIAGNOSIS — E871 Hypo-osmolality and hyponatremia: Secondary | ICD-10-CM | POA: Diagnosis present

## 2014-11-18 LAB — URINALYSIS, ROUTINE W REFLEX MICROSCOPIC
GLUCOSE, UA: NEGATIVE mg/dL
Ketones, ur: 15 mg/dL — AB
Nitrite: NEGATIVE
PH: 6.5 (ref 5.0–8.0)
PROTEIN: 30 mg/dL — AB
Specific Gravity, Urine: 1.01 (ref 1.005–1.030)
Urobilinogen, UA: 1 mg/dL (ref 0.0–1.0)

## 2014-11-18 LAB — URINE MICROSCOPIC-ADD ON

## 2014-11-18 LAB — CBC WITH DIFFERENTIAL/PLATELET
BASOS ABS: 0.1 10*3/uL (ref 0.0–0.1)
Basophils Relative: 0 % (ref 0–1)
Eosinophils Absolute: 0.1 10*3/uL (ref 0.0–0.7)
Eosinophils Relative: 1 % (ref 0–5)
HEMATOCRIT: 37.4 % (ref 36.0–46.0)
Hemoglobin: 12.7 g/dL (ref 12.0–15.0)
LYMPHS PCT: 28 % (ref 12–46)
Lymphs Abs: 4.3 10*3/uL — ABNORMAL HIGH (ref 0.7–4.0)
MCH: 26.6 pg (ref 26.0–34.0)
MCHC: 34 g/dL (ref 30.0–36.0)
MCV: 78.2 fL (ref 78.0–100.0)
MONO ABS: 1 10*3/uL (ref 0.1–1.0)
MONOS PCT: 7 % (ref 3–12)
NEUTROS ABS: 10.1 10*3/uL — AB (ref 1.7–7.7)
Neutrophils Relative %: 64 % (ref 43–77)
Platelets: 413 10*3/uL — ABNORMAL HIGH (ref 150–400)
RBC: 4.78 MIL/uL (ref 3.87–5.11)
RDW: 15.1 % (ref 11.5–15.5)
WBC: 15.6 10*3/uL — AB (ref 4.0–10.5)

## 2014-11-18 LAB — I-STAT VENOUS BLOOD GAS, ED
ACID-BASE DEFICIT: 15 mmol/L — AB (ref 0.0–2.0)
Bicarbonate: 9.9 mEq/L — ABNORMAL LOW (ref 20.0–24.0)
O2 Saturation: 32 %
TCO2: 11 mmol/L (ref 0–100)
pCO2, Ven: 22.9 mmHg — ABNORMAL LOW (ref 45.0–50.0)
pH, Ven: 7.244 — ABNORMAL LOW (ref 7.250–7.300)
pO2, Ven: 23 mmHg — CL (ref 30.0–45.0)

## 2014-11-18 LAB — MAGNESIUM: Magnesium: 1.7 mg/dL (ref 1.5–2.5)

## 2014-11-18 LAB — COMPREHENSIVE METABOLIC PANEL
ALK PHOS: 133 U/L — AB (ref 39–117)
ALT: 16 U/L (ref 0–35)
AST: 23 U/L (ref 0–37)
Albumin: 2.7 g/dL — ABNORMAL LOW (ref 3.5–5.2)
Anion gap: 21 — ABNORMAL HIGH (ref 5–15)
BILIRUBIN TOTAL: 0.3 mg/dL (ref 0.3–1.2)
BUN: 18 mg/dL (ref 6–23)
CHLORIDE: 96 meq/L (ref 96–112)
CO2: 10 mEq/L — CL (ref 19–32)
CREATININE: 1.79 mg/dL — AB (ref 0.50–1.10)
Calcium: 8.9 mg/dL (ref 8.4–10.5)
GFR calc Af Amer: 33 mL/min — ABNORMAL LOW (ref >90)
GFR, EST NON AFRICAN AMERICAN: 28 mL/min — AB (ref >90)
Glucose, Bld: 77 mg/dL (ref 70–99)
POTASSIUM: 2.7 meq/L — AB (ref 3.7–5.3)
Sodium: 127 mEq/L — ABNORMAL LOW (ref 137–147)
Total Protein: 8.8 g/dL — ABNORMAL HIGH (ref 6.0–8.3)

## 2014-11-18 LAB — LIPASE, BLOOD: Lipase: 76 U/L — ABNORMAL HIGH (ref 11–59)

## 2014-11-18 LAB — I-STAT TROPONIN, ED: Troponin i, poc: 0 ng/mL (ref 0.00–0.08)

## 2014-11-18 LAB — I-STAT CG4 LACTIC ACID, ED: Lactic Acid, Venous: 1.17 mmol/L (ref 0.5–2.2)

## 2014-11-18 MED ORDER — DEXTROSE 5 % IV SOLN
1.0000 g | INTRAVENOUS | Status: DC
  Filled 2014-11-18: qty 10

## 2014-11-18 MED ORDER — ACETAMINOPHEN 325 MG PO TABS
650.0000 mg | ORAL_TABLET | Freq: Once | ORAL | Status: DC
  Filled 2014-11-18 (×2): qty 2

## 2014-11-18 MED ORDER — ZOLPIDEM TARTRATE 5 MG PO TABS: 10.0000 mg | ORAL_TABLET | Freq: Every evening | ORAL | Status: DC | PRN

## 2014-11-18 MED ORDER — POTASSIUM CHLORIDE IN NACL 20-0.9 MEQ/L-% IV SOLN
INTRAVENOUS | Status: DC
  Administered 2014-11-18 – 2014-11-19 (×2): via INTRAVENOUS
  Filled 2014-11-18 (×4): qty 1000

## 2014-11-18 MED ORDER — PANTOPRAZOLE SODIUM 40 MG IV SOLR
40.0000 mg | INTRAVENOUS | Status: DC
  Administered 2014-11-19 – 2014-11-21 (×4): 40 mg via INTRAVENOUS
  Filled 2014-11-18 (×5): qty 40

## 2014-11-18 MED ORDER — POTASSIUM CHLORIDE CRYS ER 20 MEQ PO TBCR: 40.0000 meq | EXTENDED_RELEASE_TABLET | Freq: Once | ORAL | Status: DC

## 2014-11-18 MED ORDER — PROMETHAZINE HCL 25 MG/ML IJ SOLN
12.5000 mg | Freq: Four times a day (QID) | INTRAMUSCULAR | Status: DC | PRN
  Administered 2014-11-18 – 2014-11-19 (×3): 12.5 mg via INTRAVENOUS
  Filled 2014-11-18 (×4): qty 1

## 2014-11-18 MED ORDER — ACETAMINOPHEN 650 MG RE SUPP: 650.0000 mg | RECTAL | Status: DC | PRN

## 2014-11-18 MED ORDER — POTASSIUM CHLORIDE 10 MEQ/100ML IV SOLN
10.0000 meq | INTRAVENOUS | Status: AC
  Administered 2014-11-18: 10 meq via INTRAVENOUS
  Filled 2014-11-18: qty 100

## 2014-11-18 MED ORDER — NYSTATIN 100000 UNIT/GM EX POWD
Freq: Three times a day (TID) | CUTANEOUS | Status: DC
  Administered 2014-11-19 – 2014-11-22 (×9): via TOPICAL
  Filled 2014-11-18: qty 15

## 2014-11-18 MED ORDER — POTASSIUM CHLORIDE 10 MEQ/100ML IV SOLN
10.0000 meq | INTRAVENOUS | Status: AC
  Filled 2014-11-18: qty 100

## 2014-11-18 MED ORDER — CEFTRIAXONE SODIUM IN DEXTROSE 20 MG/ML IV SOLN
1.0000 g | INTRAVENOUS | Status: DC
  Administered 2014-11-19 – 2014-11-20 (×2): 1 g via INTRAVENOUS
  Filled 2014-11-18 (×3): qty 50

## 2014-11-18 MED ORDER — TRIAMCINOLONE ACETONIDE 0.1 % EX CREA
1.0000 "application " | TOPICAL_CREAM | Freq: Two times a day (BID) | CUTANEOUS | Status: DC
  Administered 2014-11-19 – 2014-11-21 (×6): 1 via TOPICAL
  Filled 2014-11-18 (×2): qty 15

## 2014-11-18 MED ORDER — HEPARIN SODIUM (PORCINE) 5000 UNIT/ML IJ SOLN
5000.0000 [IU] | Freq: Three times a day (TID) | INTRAMUSCULAR | Status: DC
  Administered 2014-11-19 – 2014-11-21 (×8): 5000 [IU] via SUBCUTANEOUS
  Filled 2014-11-18 (×10): qty 1

## 2014-11-18 MED ORDER — PROMETHAZINE HCL 25 MG PO TABS
12.5000 mg | ORAL_TABLET | Freq: Four times a day (QID) | ORAL | Status: DC | PRN
  Administered 2014-11-18 – 2014-11-21 (×2): 12.5 mg via ORAL
  Filled 2014-11-18 (×2): qty 1

## 2014-11-18 MED ORDER — DEXTROSE 5 % IV SOLN: 1.0000 g | INTRAVENOUS | Status: DC

## 2014-11-18 MED ORDER — ZOLPIDEM TARTRATE 5 MG PO TABS: 5.0000 mg | ORAL_TABLET | Freq: Every evening | ORAL | Status: DC | PRN

## 2014-11-18 MED ORDER — DEXTROSE 5 % IV SOLN
1.0000 g | Freq: Once | INTRAVENOUS | Status: AC
  Administered 2014-11-18: 1 g via INTRAVENOUS
  Filled 2014-11-18: qty 10

## 2014-11-18 MED ORDER — ALUM & MAG HYDROXIDE-SIMETH 200-200-20 MG/5ML PO SUSP: 30.0000 mL | Freq: Four times a day (QID) | ORAL | Status: DC | PRN

## 2014-11-18 MED ORDER — SODIUM CHLORIDE 0.9 % IV BOLUS (SEPSIS)
1000.0000 mL | Freq: Once | INTRAVENOUS | Status: AC
  Administered 2014-11-18: 1000 mL via INTRAVENOUS

## 2014-11-18 NOTE — ED Notes (Signed)
Pt aware of urine sample needed, pt unable to go at this time. 

## 2014-11-18 NOTE — ED Notes (Signed)
Pt states that she has had SOB, N/V/D for approx 5 days. Pt states that she has IBS and Crohns disease. Pt states that she has intermittent chest pain the feels like "pins" sticking her. Pt states that she has list approx 20 lbs since September because she cannot eat. Pt has also not been able to take her potassium in a week.

## 2014-11-18 NOTE — H&P (Addendum)
[facility] History and Physical  Danielle [name]:[id number] DOB: [birth year] DOA: 11/18/2014   PCP: Maggie Font, MD    Chief Complaint: Vomiting and diarrhea  HPI: Danielle [name] is a 68 y.o. female with past medical history of Crohn's, on and off diarrhea,-esophageal reflux disease, hiatal hernia and psoriasis who presents with one-week complaint of vomiting and watery stool. She states that this does not feel like a Crohn's exacerbation as she has no abdominal pain. She has not had any fever, chills or sweats. She has not had any sick contacts. She did take 2 doses of an antibiotic given by her dentist 2 weeks ago prior to getting her teeth pulled. She is able to tolerate water and tomatoes only. She does have Lomotil ordered for when she has such exacerbations. She took 2 doses yesterday without any improvement.   General: + anorexia,  5 lb weight loss- no fever chills, has a headache Cardiac: Denies syncope, feeling lightheaded, palpitations, pedal edema - had chest pain and shortness of breath after vomiting yesterday and today- resolved on its own.  Respiratory: Denies cough, shortness of breath, wheezing GI: per HPI GU: Denies hematuria, incontinence, + dysuria  Musculoskeletal: Denies arthritis or back pain Skin: has psoriasis on legs- rash on face starting today which is not pruritic-also has a rash in her groin and bellybutton Neurologic: Denies focal weakness or numbness, change in vision Psychiatry: Denies depression or anxiety.   Past Medical History  Diagnosis Date  . Crohn's disease   . Chronic diarrhea   . GERD (gastroesophageal reflux disease)   . Hiatal hernia   . Hemorrhoids   . Anemia   . Pneumonia 2014  . Psoriasis     Past Surgical History  Procedure Laterality Date  . Bowel resection  2009  . 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 Anal stenosis  ~ 2010  . Colonoscopy  2013  . Cholecystectomy  1990's  . Appendectomy  1980's  . Goiter  1999  .  Esophagogastroduodenoscopy  11/11/2011    Procedure: ESOPHAGOGASTRODUODENOSCOPY (EGD);  Surgeon: Missy Sabins, MD;  Location: [facility] ENDOSCOPY;  Service: Endoscopy;  Laterality: N/A;  . Flexible sigmoidoscopy  11/11/2011    Procedure: FLEXIBLE SIGMOIDOSCOPY;  Surgeon: Missy Sabins, MD;  Location: [facility];  Service: Endoscopy;  Laterality: N/A;  . Esophagogastroduodenoscopy  05/18/2012    Procedure: ESOPHAGOGASTRODUODENOSCOPY (EGD);  Surgeon: Rogene Houston, MD;  Location: [facility] ENDO SUITE;  Service: Endoscopy;  Laterality: N/A;  200  . Colostomy takedown  2009    "only had it for about 1 month" (08/22/2013)  . Tonsillectomy    . Left heart catheterization with coronary angiogram N/A 07/09/2014    Procedure: LEFT HEART CATHETERIZATION WITH CORONARY ANGIOGRAM;  Surgeon: Peter M Martinique, MD;  Location: [facility] CATH LAB;  Service: Cardiovascular;  Laterality: N/A;    Social History: does not smoke cigarettes or drink alcohol Lives at home with sister, no assistive devices needed   Allergies  Allergen Reactions  . Remicade [Infliximab] Rash    Per Patient - she has developed a rash. Being seen by Dr. Flisher/Dermatology @ [facility].   . Zithromax [Azithromycin] Swelling    Patient states that she has a swollen mouth/ blisters, yeast present mouth and vaginia.  . Aspirin Other (See Comments)    Cramps her stomach  . Ciprofloxacin     unknown  . Sulfa Antibiotics Nausea And Vomiting    Family History  Problem Relation Age of  Onset  . Diabetes Mother   . Healthy Sister   . Hypertension Brother   . Colon cancer Brother     died with colon cancer  . Heart disease Father       Prior to Admission medications   Medication Sig Start Date End Date Taking? Authorizing Provider  albuterol (PROVENTIL HFA;VENTOLIN HFA) 108 (90 BASE) MCG/ACT inhaler Inhale 2 puffs into the lungs every 6 (six) hours as needed for wheezing. 02/27/13  Yes Nishant Dhungel, MD  Calcium Carbonate-Vitamin D (CALCIUM + D PO) Take 1  tablet by mouth daily.   Yes Historical Provider, MD  dicyclomine (BENTYL) 10 MG/5ML syrup TAKE 2 TEASPOONSFUL 3 TIMES A DAY BEFORE MEALS 10/09/14  Yes Butch Penny, NP  diphenhydrAMINE (BENADRYL) 25 mg capsule Take 25 mg by mouth every 6 (six) hours as needed for itching. 03/22/14  Yes Adeline Saralyn Pilar, MD  diphenoxylate-atropine (LOMOTIL) 2.5-0.025 MG per tablet Take 1 tablet by mouth 4 (four) times daily as needed for diarrhea or loose stools. 09/03/14  Yes Rogene Houston, MD  HUMIRA PEN 40 MG/0.8ML PNKT Inject 40 mg into the skin every 14 (fourteen) days.  08/02/14  Yes Historical Provider, MD  HYDROcodone-acetaminophen (NORCO) 7.5-325 MG per tablet Take 1 tablet by mouth every 6 (six) hours as needed for moderate pain.   Yes Historical Provider, MD  KLOR-CON M20 20 MEQ tablet TAKE 1 TABLET BY MOUTH 3 TIMES A DAY 09/18/14  Yes Rogene Houston, MD  Multiple Vitamin (MULTIVITAMIN WITH MINERALS) TABS Take 1 tablet by mouth daily.   Yes Historical Provider, MD  nystatin cream (MYCOSTATIN) Apply topically 2 (two) times daily as needed for dry skin. 11/13/14  Yes Rogene Houston, MD  ondansetron (ZOFRAN) 4 MG tablet TAKE 1 TABLET TWICE A DAY 08/15/14  Yes Butch Penny, NP  pantoprazole (PROTONIX) 40 MG tablet Take 1 tablet (40 mg total) by mouth 2 (two) times daily before a meal. 03/29/14  Yes Butch Penny, NP  spironolactone (ALDACTONE) 50 MG tablet Take 50 mg by mouth daily.   Yes Historical Provider, MD  triamcinolone cream (KENALOG) 0.1 % Apply 1 application topically 2 (two) times daily. 06/28/14  Yes Rogene Houston, MD  zolpidem (AMBIEN) 10 MG tablet Take 10 mg by mouth at bedtime as needed for sleep.  01/04/14  Yes Historical Provider, MD  Cyanocobalamin (VITAMIN B-12 IJ) Inject 1 each as directed every 30 (thirty) days. Around the 1st week of each month    Historical Provider, MD     Physical Exam: Filed Vitals:   11/18/14 1800 11/18/14 1833 11/18/14 1900 11/18/14 1938  BP: 101/68 101/68  115/78 106/65  Pulse:  100  89  Temp:      TempSrc:      Resp: 15 18 16 18   Height:      Weight:      SpO2: 100% 100% 100% 100%     General: Awake alert oriented 3, no acute distress HEENT: Normocephalic and Atraumatic, Mucous membranes pink and dry                PERRLA; EOM intact; No scleral icterus,                 Nares: Patent, Oropharynx: Clear, Fair Dentition                 Neck: FROM, no cervical lymphadenopathy, thyromegaly, carotid bruit or JVD;  Breasts: deferred CHEST WALL: No tenderness  CHEST:  Normal respiration, clear to auscultation bilaterally  HEART: Regular rate and rhythm; no murmurs rubs or gallops  BACK: No kyphosis or scoliosis; no CVA tenderness  ABDOMEN: Positive Bowel Sounds, soft, non-tender; no masses, no organomegaly Rectal Exam: deferred EXTREMITIES: No cyanosis, clubbing, or edema Genitalia: not examined  SKIN:  Macular rash on cheeks around lips and on chin, moist erythematous rash in periumbilical area and in groin with additional excoriation in the groin area. CNS: Alert and Oriented x 4, Nonfocal exam, CN 2-12 intact  Labs on Admission:  Basic Metabolic Panel:  Recent Labs Lab 11/18/14 1710  NA 127*  K 2.7*  CL 96  CO2 10*  GLUCOSE 77  BUN 18  CREATININE 1.79*  CALCIUM 8.9   Liver Function Tests:  Recent Labs Lab 11/18/14 1710  AST 23  ALT 16  ALKPHOS 133*  BILITOT 0.3  PROT 8.8*  ALBUMIN 2.7*    Recent Labs Lab 11/18/14 1710  LIPASE 76*   No results for input(s): AMMONIA in the last 168 hours. CBC:  Recent Labs Lab 11/18/14 1710  WBC 15.6*  NEUTROABS 10.1*  HGB 12.7  HCT 37.4  MCV 78.2  PLT 413*   Cardiac Enzymes: No results for input(s): CKTOTAL, CKMB, CKMBINDEX, TROPONINI in the last 168 hours.  BNP (last 3 results)  Recent Labs  03/20/14 1052 07/08/14 1454  PROBNP 255.5* 210.7*   CBG: No results for input(s): GLUCAP in the last 168 hours.  Radiological Exams on Admission: No results  found.  EKG: Independently reviewed. Sinus tach with LVH  Assessment/Plan Principal Problem:   Gastroenteritis -GI pathogen panel ordered -Nothing by mouth except sips with meds-IV fluids while nothing by mouth -Hold off on further doses of Lomotil for now -Switch oral meds to IV were available  Active Problems:   Metabolic acidosis -Due to diarrhea and renal failure-hydrate and follow-repeat Bmet in the morning    ARF (acute renal failure) -Baseline creatinine is 1.1-1.3 -Suspect this is prerenal-continue to hydrate and recheck in the morning    Hyponatremia -Due to dehydration-recheck in a.m.     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 as she is on potassium replacement at home despite being on Aldactone -This is likely being exacerbated by ongoing diarrhea and inability to take oral potassium -Infusing IV potassium but as it is causing burning it will need to be infused slowly-Will order potassium to continuous IV fluids as well -Check magnesium and replace if needed  UTI - she admits to dysuria -Ultrasound-Rocephin started    Rash of face -Cause uncertain-just started today - follow- will hold off on ordering medications for it    Crohn's disease -At this point patient does not feel this is a Crohn's flare-she takes Humira as outpatient and follows with Dr. 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      Consulted: none  Code Status: Full code Family Communication: Louretta Parma DVT Prophylaxis: Heparin  Time spent: 34 min  Victor, MD [facility]  If 7PM-7AM, please contact night-coverage [email or web address] 11/18/2014, 9:00 PM

## 2014-11-18 NOTE — ED Provider Notes (Signed)
CSN: [id number]     Arrival date & time 11/18/14  1615 History   First MD Initiated Contact with Patient 11/18/14 1655     Chief Complaint  Patient presents with  . Shortness of Breath  . Diarrhea  . Emesis     (Consider location/radiation/quality/duration/timing/severity/associated sxs/prior Treatment) HPI Patient presents to the emergency department with nausea, vomiting and diarrhea that over the last 5 days.  Patient does have a history of Crohn's disease.  She states she has had some intermittent shortness of breath, but has not been consistent.  The patient states that she has had these types of issues in the past and states that her potassium was going low.  The patient states that she does not have any chest pain, weakness, dizziness, back pain, neck pain, fever, cough, runny nose, sore throat, dysuria, abdominal pain, hematemesis or bloody stool.  Patient also denies syncope.  Patient states she feels like her potassium may be low and she has felt some generalized fatigue.  The patient states that nothing seems make her condition better or worse.  Patient, states she has not been able to eat or drink very well since this started Past Medical History  Diagnosis Date  . Crohn's disease   . Chronic diarrhea   . GERD (gastroesophageal reflux disease)   . Polyarthralgia   . Hiatal hernia   . Hemorrhoids   . Anemia   . Hiatal hernia     hiatal  . Pneumonia 2014  . Gout   . Psoriasis    Past Surgical History  Procedure Laterality Date  . Bowel resection  2009  . 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 Anal stenosis  ~ 2010  . Colonoscopy  2013  . Cholecystectomy  1990's  . Appendectomy  1980's  . Goiter  1999  . Esophagogastroduodenoscopy  11/11/2011    Procedure: ESOPHAGOGASTRODUODENOSCOPY (EGD);  Surgeon: Missy Sabins, MD;  Location[facility] ENDOSCOPY;  Service: Endoscopy;  Laterality: N/A;  . Flexible sigmoidoscopy  11/11/2011    Procedure: FLEXIBLE SIGMOIDOSCOPY;  Surgeon: Missy Sabins, MD;  Location: [place];  Service: Endoscopy;  Laterality: N/A;  . Esophagogastroduodenoscopy  05/18/2012    Procedure: ESOPHAGOGASTRODUODENOSCOPY (EGD);  Surgeon: Rogene Houston, MD;  Location: [facility] ENDO SUITE;  Service: Endoscopy;  Laterality: N/A;  200  . Colostomy takedown  2009    "only had it for about 1 month" (08/22/2013)  . Tonsillectomy    . Left heart catheterization with coronary angiogram N/A 07/09/2014    Procedure: LEFT HEART CATHETERIZATION WITH CORONARY ANGIOGRAM;  Surgeon: Peter M Martinique, MD;  Location: [facility] CATH LAB;  Service: Cardiovascular;  Laterality: N/A;   Family History  Problem Relation Age of Onset  . Diabetes Mother   . Healthy Sister   . Hypertension Brother   . Colon cancer Brother     died with colon cancer  . Heart disease Father    History  Substance Use Topics  . Smoking status: Former Smoker -- 0.12 packs/day for 4 years    Types: Cigarettes    Quit date: 10/05/1996  . Smokeless tobacco: Never Used     Comment: 08/22/2013 1 pack every two weeks when she did smoked  . Alcohol Use: 1.2 oz/week    2 Glasses of wine per week     Comment: 08/22/2013 "glass of wine maybe twice/wk"   OB History    No data available     Review of Systems  All  other systems negative except as documented in the HPI. All pertinent positives and negatives as reviewed in the HPI.   Allergies  Remicade; Zithromax; Aspirin; Ciprofloxacin; and Sulfa antibiotics  Home Medications   Prior to Admission medications   Medication Sig Start Date End Date Taking? Authorizing Provider  albuterol (PROVENTIL HFA;VENTOLIN HFA) 108 (90 BASE) MCG/ACT inhaler Inhale 2 puffs into the lungs every 6 (six) hours as needed for wheezing. 02/27/13  Yes Nishant Dhungel, MD  Calcium Carbonate-Vitamin D (CALCIUM + D PO) Take 1 tablet by mouth daily.   Yes Historical Provider, MD  dicyclomine (BENTYL) 10 MG/5ML syrup TAKE 2 TEASPOONSFUL 3 TIMES A DAY BEFORE MEALS 10/09/14  Yes Butch Penny,  NP  diphenhydrAMINE (BENADRYL) 25 mg capsule Take 25 mg by mouth every 6 (six) hours as needed for itching. 03/22/14  Yes Adeline Saralyn Pilar, MD  diphenoxylate-atropine (LOMOTIL) 2.5-0.025 MG per tablet Take 1 tablet by mouth 4 (four) times daily as needed for diarrhea or loose stools. 09/03/14  Yes Rogene Houston, MD  HUMIRA PEN 40 MG/0.8ML PNKT Inject 40 mg into the skin every 14 (fourteen) days.  08/02/14  Yes Historical Provider, MD  HYDROcodone-acetaminophen (NORCO) 7.5-325 MG per tablet Take 1 tablet by mouth every 6 (six) hours as needed for moderate pain.   Yes Historical Provider, MD  KLOR-CON M20 20 MEQ tablet TAKE 1 TABLET BY MOUTH 3 TIMES A DAY 09/18/14  Yes Rogene Houston, MD  Multiple Vitamin (MULTIVITAMIN WITH MINERALS) TABS Take 1 tablet by mouth daily.   Yes Historical Provider, MD  nystatin cream (MYCOSTATIN) Apply topically 2 (two) times daily as needed for dry skin. 11/13/14  Yes Rogene Houston, MD  ondansetron (ZOFRAN) 4 MG tablet TAKE 1 TABLET TWICE A DAY 08/15/14  Yes Butch Penny, NP  pantoprazole (PROTONIX) 40 MG tablet Take 1 tablet (40 mg total) by mouth 2 (two) times daily before a meal. 03/29/14  Yes Butch Penny, NP  spironolactone (ALDACTONE) 50 MG tablet Take 50 mg by mouth daily.   Yes Historical Provider, MD  triamcinolone cream (KENALOG) 0.1 % Apply 1 application topically 2 (two) times daily. 06/28/14  Yes Rogene Houston, MD  zolpidem (AMBIEN) 10 MG tablet Take 10 mg by mouth at bedtime as needed for sleep.  01/04/14  Yes Historical Provider, MD  Cyanocobalamin (VITAMIN B-12 IJ) Inject 1 each as directed every 30 (thirty) days. Around the 1st week of each month    Historical Provider, MD   BP 106/65 mmHg  Pulse 89  Temp(Src) 97.7 F (36.5 C) (Oral)  Resp 18  Ht 5\' 4"  (1.626 m)  Wt 130 lb (58.968 kg)  BMI 22.30 kg/m2  SpO2 100% Physical Exam  Constitutional: She appears well-developed and well-nourished. No distress.  HENT:  Head: Normocephalic and  atraumatic.  Mouth/Throat: Oropharynx is clear and moist.  Eyes: Pupils are equal, round, and reactive to light.  Neck: Normal range of motion.  Cardiovascular: Normal rate, regular rhythm and normal heart sounds.  Exam reveals no gallop and no friction rub.   No murmur heard. Pulmonary/Chest: Effort normal and breath sounds normal. No respiratory distress.  Abdominal: Soft. Bowel sounds are normal. She exhibits no distension. There is no tenderness.  Musculoskeletal: She exhibits no edema.  Neurological: She is alert. She exhibits normal muscle tone. Coordination normal.  Skin: Skin is warm and dry.  Psychiatric: She has a normal mood and affect. Her behavior is normal.  Nursing note and vitals  reviewed.   ED Course  Procedures (including critical care time) Labs Review Labs Reviewed  CBC WITH DIFFERENTIAL - Abnormal; Notable for the following:    WBC 15.6 (*)    Platelets 413 (*)    Neutro Abs 10.1 (*)    Lymphs Abs 4.3 (*)    All other components within normal limits  COMPREHENSIVE METABOLIC PANEL - Abnormal; Notable for the following:    Sodium 127 (*)    Potassium 2.7 (*)    CO2 10 (*)    Creatinine, Ser 1.79 (*)    Total Protein 8.8 (*)    Albumin 2.7 (*)    Alkaline Phosphatase 133 (*)    GFR calc non Af Amer 28 (*)    GFR calc Af Amer 33 (*)    Anion gap 21 (*)    All other components within normal limits  LIPASE, BLOOD - Abnormal; Notable for the following:    Lipase 76 (*)    All other components within normal limits  URINALYSIS, ROUTINE W REFLEX MICROSCOPIC - Abnormal; Notable for the following:    APPearance CLOUDY (*)    Hgb urine dipstick MODERATE (*)    Bilirubin Urine SMALL (*)    Ketones, ur 15 (*)    Protein, ur 30 (*)    Leukocytes, UA LARGE (*)    All other components within normal limits  URINE MICROSCOPIC-ADD ON - Abnormal; Notable for the following:    Squamous Epithelial / LPF FEW (*)    Bacteria, UA MANY (*)    All other components within  normal limits  I-STAT VENOUS BLOOD GAS, ED - Abnormal; Notable for the following:    pH, Ven 7.244 (*)    pCO2, Ven 22.9 (*)    pO2, Ven 23.0 (*)    Bicarbonate 9.9 (*)    Acid-base deficit 15.0 (*)    All other components within normal limits  BLOOD GAS, VENOUS  I-STAT TROPOININ, ED  I-STAT CG4 LACTIC ACID, ED    Imaging Review No results found.   EKG Interpretation   Date/Time:  Sunday November 18 2014 16:22:46 EST Ventricular Rate:  105 PR Interval:  116 QRS Duration: 102 QT Interval:  346 QTC Calculation: 457 R Axis:   -24 Text Interpretation:  Sinus tachycardia Left ventricular hypertrophy with  repolarization abnormality Cannot rule out Septal infarct , age  undetermined Abnormal ECG Nonspecific changes laterally Confirmed by  Mingo Amber  MD, [place] ([id number]) on 11/18/2014 4:57:41 PM      Patient be admitted to the hospital for further evaluation and care.  She has metabolic acidosis, most likely from her vomiting and diarrhea.  The patient is advised of the plan and all questions were answered    Brent General, PA-C 11/19/14 0125  Evelina Bucy, MD 11/23/14 [phone]

## 2014-11-18 NOTE — ED Notes (Signed)
Received call from lab tech Katie regarding potassium of 2.7 and co2 of 10.  [place] lawyer notified

## 2014-11-18 NOTE — ED Notes (Signed)
Report attempt x 1 

## 2014-11-18 NOTE — ED Notes (Signed)
Dr Mingo Amber given a copy of venous blood gas

## 2014-11-19 DIAGNOSIS — E872 Acidosis: Secondary | ICD-10-CM

## 2014-11-19 LAB — BASIC METABOLIC PANEL
ANION GAP: 17 — AB (ref 5–15)
Anion gap: 20 — ABNORMAL HIGH (ref 5–15)
BUN: 16 mg/dL (ref 6–23)
BUN: 13 mg/dL (ref 6–23)
BUN: 11 mg/dL (ref 6–23)
CALCIUM: 8.3 mg/dL — AB (ref 8.4–10.5)
CALCIUM: 8 mg/dL — AB (ref 8.4–10.5)
CO2: 11 mEq/L — ABNORMAL LOW (ref 19–32)
CO2: 13 mEq/L — ABNORMAL LOW (ref 19–32)
Calcium: 8 mg/dL — ABNORMAL LOW (ref 8.4–10.5)
Chloride: 109 mEq/L (ref 96–112)
Chloride: 105 mEq/L (ref 96–112)
Chloride: 104 mEq/L (ref 96–112)
Creatinine, Ser: 1.28 mg/dL — ABNORMAL HIGH (ref 0.50–1.10)
Creatinine, Ser: 1.21 mg/dL — ABNORMAL HIGH (ref 0.50–1.10)
Creatinine, Ser: 1.01 mg/dL (ref 0.50–1.10)
GFR calc Af Amer: 49 mL/min — ABNORMAL LOW (ref >90)
GFR calc Af Amer: 53 mL/min — ABNORMAL LOW (ref >90)
GFR calc Af Amer: 66 mL/min — ABNORMAL LOW (ref >90)
GFR calc non Af Amer: 57 mL/min — ABNORMAL LOW (ref >90)
GFR, EST NON AFRICAN AMERICAN: 43 mL/min — AB (ref >90)
GFR, EST NON AFRICAN AMERICAN: 46 mL/min — AB (ref >90)
GLUCOSE: 56 mg/dL — AB (ref 70–99)
GLUCOSE: 86 mg/dL (ref 70–99)
Glucose, Bld: 94 mg/dL (ref 70–99)
POTASSIUM: 3.5 meq/L — AB (ref 3.7–5.3)
Potassium: 2.5 mEq/L — CL (ref 3.7–5.3)
Potassium: 2.6 mEq/L — CL (ref 3.7–5.3)
SODIUM: 134 meq/L — AB (ref 137–147)
Sodium: 133 mEq/L — ABNORMAL LOW (ref 137–147)
Sodium: 136 mEq/L — ABNORMAL LOW (ref 137–147)

## 2014-11-19 LAB — GI PATHOGEN PANEL BY PCR, STOOL
C difficile toxin A/B: NEGATIVE
Campylobacter by PCR: NEGATIVE
Cryptosporidium by PCR: NEGATIVE
E COLI (ETEC) LT/ST: NEGATIVE
E COLI (STEC): NEGATIVE
E coli 0157 by PCR: NEGATIVE
G lamblia by PCR: NEGATIVE
NOROVIRUS G1/G2: NEGATIVE
Rotavirus A by PCR: NEGATIVE
SALMONELLA BY PCR: NEGATIVE
Shigella by PCR: NEGATIVE

## 2014-11-19 LAB — CBC
HCT: 32.2 % — ABNORMAL LOW (ref 36.0–46.0)
Hemoglobin: 10.4 g/dL — ABNORMAL LOW (ref 12.0–15.0)
MCH: 25.7 pg — AB (ref 26.0–34.0)
MCHC: 32.3 g/dL (ref 30.0–36.0)
MCV: 79.5 fL (ref 78.0–100.0)
PLATELETS: 332 10*3/uL (ref 150–400)
RBC: 4.05 MIL/uL (ref 3.87–5.11)
RDW: 15.4 % (ref 11.5–15.5)
WBC: 11.5 10*3/uL — ABNORMAL HIGH (ref 4.0–10.5)

## 2014-11-19 LAB — GLUCOSE, CAPILLARY: Glucose-Capillary: 165 mg/dL — ABNORMAL HIGH (ref 70–99)

## 2014-11-19 MED ORDER — POTASSIUM CHLORIDE CRYS ER 20 MEQ PO TBCR
40.0000 meq | EXTENDED_RELEASE_TABLET | Freq: Once | ORAL | Status: DC
  Filled 2014-11-19: qty 2

## 2014-11-19 MED ORDER — SODIUM BICARBONATE 8.4 % IV SOLN
INTRAVENOUS | Status: DC
  Administered 2014-11-19 – 2014-11-21 (×3): via INTRAVENOUS
  Filled 2014-11-19 (×6): qty 150

## 2014-11-19 MED ORDER — NYSTATIN 100000 UNIT/GM EX CREA
TOPICAL_CREAM | Freq: Two times a day (BID) | CUTANEOUS | Status: DC
  Administered 2014-11-20 – 2014-11-22 (×4): via TOPICAL
  Filled 2014-11-19: qty 15

## 2014-11-19 MED ORDER — FLUCONAZOLE 100MG IVPB
100.0000 mg | INTRAVENOUS | Status: DC
Start: 2014-11-19 — End: 2014-11-22
  Administered 2014-11-19 – 2014-11-22 (×4): 100 mg via INTRAVENOUS
  Filled 2014-11-19 (×4): qty 50

## 2014-11-19 MED ORDER — POTASSIUM CHLORIDE 10 MEQ/100ML IV SOLN
10.0000 meq | INTRAVENOUS | Status: DC
  Filled 2014-11-19 (×4): qty 100

## 2014-11-19 MED ORDER — POTASSIUM CHLORIDE CRYS ER 20 MEQ PO TBCR: 40.0000 meq | EXTENDED_RELEASE_TABLET | Freq: Once | ORAL | Status: DC

## 2014-11-19 MED ORDER — PROMETHAZINE HCL 25 MG/ML IJ SOLN
12.5000 mg | Freq: Once | INTRAMUSCULAR | Status: AC
  Administered 2014-11-19: 12.5 mg via INTRAVENOUS

## 2014-11-19 MED ORDER — POTASSIUM CHLORIDE 10 MEQ/100ML IV SOLN
10.0000 meq | INTRAVENOUS | Status: AC
  Administered 2014-11-19: 10 meq via INTRAVENOUS
  Filled 2014-11-19 (×4): qty 100

## 2014-11-19 MED ORDER — POTASSIUM CHLORIDE 20 MEQ/15ML (10%) PO SOLN
40.0000 meq | Freq: Four times a day (QID) | ORAL | Status: AC
  Administered 2014-11-20 (×2): 40 meq via ORAL
  Filled 2014-11-19 (×2): qty 30

## 2014-11-19 MED ORDER — POTASSIUM CHLORIDE 10 MEQ/100ML IV SOLN
10.0000 meq | INTRAVENOUS | Status: DC
  Administered 2014-11-19: 10 meq via INTRAVENOUS
  Filled 2014-11-19 (×3): qty 100

## 2014-11-19 MED ORDER — POTASSIUM CHLORIDE 10 MEQ/100ML IV SOLN
10.0000 meq | INTRAVENOUS | Status: AC
  Administered 2014-11-19 (×2): 10 meq via INTRAVENOUS
  Filled 2014-11-19 (×2): qty 100

## 2014-11-19 MED ORDER — ONDANSETRON HCL 4 MG/2ML IJ SOLN
4.0000 mg | Freq: Four times a day (QID) | INTRAMUSCULAR | Status: DC | PRN
  Administered 2014-11-19: 4 mg via INTRAVENOUS
  Filled 2014-11-19: qty 2

## 2014-11-19 MED ORDER — BOOST / RESOURCE BREEZE PO LIQD
1.0000 | Freq: Two times a day (BID) | ORAL | Status: DC
  Administered 2014-11-20 – 2014-11-21 (×4): 1 via ORAL

## 2014-11-19 MED ORDER — DEXTROSE 50 % IV SOLN
INTRAVENOUS | Status: AC
  Administered 2014-11-19: 50 mL
  Filled 2014-11-19: qty 50

## 2014-11-19 MED ORDER — MAGNESIUM SULFATE 2 GM/50ML IV SOLN
2.0000 g | Freq: Once | INTRAVENOUS | Status: AC
  Administered 2014-11-19: 2 g via INTRAVENOUS
  Filled 2014-11-19: qty 50

## 2014-11-19 MED ORDER — POTASSIUM CHLORIDE CRYS ER 20 MEQ PO TBCR
40.0000 meq | EXTENDED_RELEASE_TABLET | Freq: Once | ORAL | Status: DC
  Filled 2014-11-19 (×2): qty 2

## 2014-11-19 NOTE — Consult Note (Addendum)
WOC wound consult note Reason for Consult: Consult requested for superficial lesions to BLE.  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  She has previously been unsing Nystatin cream to dry the affected areas.  She does not use dressings to these sites. Wound type: Multiple patchy areas of purple-red circles which are partial thickness; no odor, drainage, or depth. Dressing procedure/placement/frequency: Please order Nystatin cream BID if you agree with the plan of care.  Pt can resume follow-up with dermatologist after discharge. Please re-consult if further assistance is needed.  Thank-you,  Julien Girt MSN, [place], [place], [place], [place]

## 2014-11-19 NOTE — Consult Note (Signed)
Referring Provider:   Dr. Niel Hummer Primary Care Physician:  Maggie Font, MD Primary Gastroenterologist:  Dr. Hildred Laser  Reason for Consultation:  Vomiting and diarrhea in the setting of Crohn's disease.  HPI: Danielle [name] is a 68 y.o. female who was diagnosed with Crohn's disease in her 52s, approximately 30 years ago. She was admitted to the hospital yesterday because of protracted vomiting and diarrhea.  The patient's Crohn's disease was initially diagnosed and managed in [place], [place]. By her report, it is apparently predominantly small bowel disease with some perianal disease (anal stenosis). She does not have a history of Crohn's colitis, as far as she is aware. She has had a total of 5 ileal resections, most recently about 10 years ago, by her report.  The patient has been managed with anti-TNF agents for a number of years, perhaps 10 years. She was on Remicade for a long time and was switched to Humira couple of months ago.  4 months ago, she was seen by her primary gastroenterologist and noted to have frequent, loose stools, but the patient indicates that the problem had normalized until recently. She was down to her baseline of 2 soft bowel movements per day.  Then, 10 days ago, she was treated was a couple of doses of Cipro for dental work. The next day, she developed vomiting and diarrhea, which has been with her basically ever since. The vomiting was several times a day and consisted of "clear phlegm", no bile, no blood. As of a couple of days ago, the vomiting subsided and was replaced just by nausea. She is able to drink liquids okay at this time.  Around the same time as the vomiting started, she also developed diarrhea, consisting of 6 or 7 loose bowel movements per day, nonbloody, and without cramps. She did have some nocturnal diarrhea. There was a little bit of blood on the toilet paper from perianal excoriation.  Around the same time, the patient  developed a nonpruritic rash of sores on her lower extremities and to some degree on her trunk. There was a similar reaction, both in terms of GI symptoms in skin lesions, about 6 months ago when treated with azithromycin.  Review of the patient's CT scan from 4 months ago shows a massive hiatal hernia with part of the transverse colon up in the chest, colon with two thirds of her stomach. She did show some small bowel dilatation and hyper enhancement suggestive of disease activity.   Past Medical History  Diagnosis Date  . Crohn's disease   . Chronic diarrhea   . GERD (gastroesophageal reflux disease)   . Polyarthralgia   . Hiatal hernia   . Hemorrhoids   . Anemia   . Hiatal hernia     hiatal  . Pneumonia 2014  . Gout   . Psoriasis     Past Surgical History  Procedure Laterality Date  . Bowel resection  2009  . 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 Anal stenosis  ~ 2010  . Colonoscopy  2013  . Cholecystectomy  1990's  . Appendectomy  1980's  . Goiter  1999  . Esophagogastroduodenoscopy  11/11/2011    Procedure: ESOPHAGOGASTRODUODENOSCOPY (EGD);  Surgeon: Missy Sabins, MD;  Location: [facility] ENDOSCOPY;  Service: Endoscopy;  Laterality: N/A;  . Flexible sigmoidoscopy  11/11/2011    Procedure: FLEXIBLE SIGMOIDOSCOPY;  Surgeon: Missy Sabins, MD;  Location: [place];  Service: Endoscopy;  Laterality: N/A;  . Esophagogastroduodenoscopy  05/18/2012  Procedure: ESOPHAGOGASTRODUODENOSCOPY (EGD);  Surgeon: Rogene Houston, MD;  Location: [facility] ENDO SUITE;  Service: Endoscopy;  Laterality: N/A;  200  . Colostomy takedown  2009    "only had it for about 1 month" (08/22/2013)  . Left heart catheterization with coronary angiogram N/A 07/09/2014    Procedure: LEFT HEART CATHETERIZATION WITH CORONARY ANGIOGRAM;  Surgeon: Peter M Martinique, MD;  Location: [facility] CATH LAB;  Service: Cardiovascular;  Laterality: N/A;    Prior to Admission medications   Medication Sig Start Date End Date Taking? Authorizing  Provider  albuterol (PROVENTIL HFA;VENTOLIN HFA) 108 (90 BASE) MCG/ACT inhaler Inhale 2 puffs into the lungs every 6 (six) hours as needed for wheezing. 02/27/13  Yes Nishant Dhungel, MD  Calcium Carbonate-Vitamin D (CALCIUM + D PO) Take 1 tablet by mouth daily.   Yes Historical Provider, MD  dicyclomine (BENTYL) 10 MG/5ML syrup TAKE 2 TEASPOONSFUL 3 TIMES A DAY BEFORE MEALS 10/09/14  Yes Butch Penny, NP  diphenhydrAMINE (BENADRYL) 25 mg capsule Take 25 mg by mouth every 6 (six) hours as needed for itching. 03/22/14  Yes Adeline Saralyn Pilar, MD  diphenoxylate-atropine (LOMOTIL) 2.5-0.025 MG per tablet Take 1 tablet by mouth 4 (four) times daily as needed for diarrhea or loose stools. 09/03/14  Yes Rogene Houston, MD  HUMIRA PEN 40 MG/0.8ML PNKT Inject 40 mg into the skin every 14 (fourteen) days.  08/02/14  Yes Historical Provider, MD  HYDROcodone-acetaminophen (NORCO) 7.5-325 MG per tablet Take 1 tablet by mouth every 6 (six) hours as needed for moderate pain.   Yes Historical Provider, MD  KLOR-CON M20 20 MEQ tablet TAKE 1 TABLET BY MOUTH 3 TIMES A DAY 09/18/14  Yes Rogene Houston, MD  Multiple Vitamin (MULTIVITAMIN WITH MINERALS) TABS Take 1 tablet by mouth daily.   Yes Historical Provider, MD  nystatin cream (MYCOSTATIN) Apply topically 2 (two) times daily as needed for dry skin. 11/13/14  Yes Rogene Houston, MD  ondansetron (ZOFRAN) 4 MG tablet TAKE 1 TABLET TWICE A DAY 08/15/14  Yes Butch Penny, NP  pantoprazole (PROTONIX) 40 MG tablet Take 1 tablet (40 mg total) by mouth 2 (two) times daily before a meal. 03/29/14  Yes Butch Penny, NP  spironolactone (ALDACTONE) 50 MG tablet Take 50 mg by mouth daily.   Yes Historical Provider, MD  triamcinolone cream (KENALOG) 0.1 % Apply 1 application topically 2 (two) times daily. 06/28/14  Yes Rogene Houston, MD  zolpidem (AMBIEN) 10 MG tablet Take 10 mg by mouth at bedtime as needed for sleep.  01/04/14  Yes Historical Provider, MD  Cyanocobalamin (VITAMIN  B-12 IJ) Inject 1 each as directed every 30 (thirty) days. Around the 1st week of each month    Historical Provider, MD    Current Facility-Administered Medications  Medication Dose Route Frequency Provider Last Rate Last Dose  . acetaminophen (TYLENOL) tablet 650 mg  650 mg Oral Once Debbe Odea, MD   650 mg at 11/18/14 2115  . alum & mag hydroxide-simeth (MAALOX/MYLANTA) 200-200-20 MG/5ML suspension 30 mL  30 mL Oral Q6H PRN Debbe Odea, MD      . cefTRIAXone (ROCEPHIN) 1 g in dextrose 5 % 50 mL IVPB - Premix  1 g Intravenous Q24H Saima Rizwan, MD      . feeding supplement (RESOURCE BREEZE) (RESOURCE BREEZE) liquid 1 Container  1 Container Oral BID BM Dorann Ou, RD      . fluconazole (DIFLUCAN) IVPB 100 mg  100 mg Intravenous  Q24H Belkys A Regalado, MD      . heparin injection 5,000 Units  5,000 Units Subcutaneous 3 times per day Debbe Odea, MD   5,000 Units at 11/19/14 0541  . magnesium sulfate IVPB 2 g 50 mL  2 g Intravenous Once Belkys A Regalado, MD      . nystatin (MYCOSTATIN/NYSTOP) topical powder   Topical TID Debbe Odea, MD      . pantoprazole (PROTONIX) injection 40 mg  40 mg Intravenous Q24H Debbe Odea, MD   40 mg at 11/19/14 0105  . potassium chloride SA (K-DUR,KLOR-CON) CR tablet 40 mEq  40 mEq Oral Once Belkys A Regalado, MD   40 mEq at 11/19/14 1151  . promethazine (PHENERGAN) injection 12.5 mg  12.5 mg Intravenous Q6H PRN Debbe Odea, MD   12.5 mg at 11/19/14 1219  . promethazine (PHENERGAN) tablet 12.5 mg  12.5 mg Oral Q6H PRN Debbe Odea, MD   12.5 mg at 11/18/14 2109  . sodium bicarbonate 150 mEq in dextrose 5 % 1,000 mL infusion   Intravenous Continuous Belkys A Regalado, MD 125 mL/hr at 11/19/14 0753    . triamcinolone cream (KENALOG) 0.1 % 1 application  1 application Topical BID Debbe Odea, MD   1 application at 93/81/01 0023  . zolpidem (AMBIEN) tablet 5 mg  5 mg Oral QHS PRN Debbe Odea, MD        Allergies as of 11/18/2014 - Review Complete  11/18/2014  Allergen Reaction Noted  . Remicade [infliximab] Rash 05/31/2014  . Zithromax [azithromycin] Swelling 02/26/2014  . Aspirin Other (See Comments) 07/22/2011  . Ciprofloxacin  03/20/2014  . Sulfa antibiotics Nausea And Vomiting 11/06/2011    Family History  Problem Relation Age of Onset  . Diabetes Mother   . Healthy Sister   . Hypertension Brother   . Colon cancer Brother     died with colon cancer  . Heart disease Father     History   Social History  . Marital Status: Single    Spouse Name: N/A    Number of Children: N/A  . Years of Education: N/A   Occupational History  . Not on file.   Social History Main Topics  . Smoking status: Former Smoker -- 0.12 packs/day for 4 years    Types: Cigarettes    Quit date: 10/05/1996  . Smokeless tobacco: Never Used     Comment: 08/22/2013 1 pack every two weeks when she did smoked  . Alcohol Use: 1.2 oz/week    2 Glasses of wine per week     Comment: 08/22/2013 "glass of wine maybe twice/wk"  . Drug Use: No  . Sexual Activity: Not Currently    Birth Control/ Protection: Post-menopausal   Other Topics Concern  . Not on file   Social History Narrative   Lives in [place].  Moved from [place] in 2008.  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  LIves with her sister.   No home health services.      Review of Systems: No neurologic symptoms or weakness. No joint effusions or problems. Skin rash as noted in history of present illness. No cough or shortness of breath. No chest pain except on one occasion after vomiting. No abdominal pain, no rectal bleeding other than scant blood on the toilet paper from excessive wiping of the perianal area. Urine has an unusual order but no pneumaturia. No hematuria.  Physical Exam: A frail-appearing African-American female appearing quite older than her age, who moves quite  slowly getting in and out of bed. However, she is in no acute distress. Vital signs in last 24 hours: Temp:  [97.7 F (36.5  C)-98 F (36.7 C)] 98 F (36.7 C) (12/14 1402) Pulse Rate:  [89-110] 110 (12/14 1402) Resp:  [15-20] 18 (12/14 1402) BP: (95-115)/(59-78) 111/68 mmHg (12/14 1402) SpO2:  [98 %-100 %] 99 % (12/14 1402) Weight:  [58.968 kg (130 lb)-61.236 kg (135 lb)] 61.236 kg (135 lb) (12/13 2250) Last BM Date: 11/19/14 General:   Alert,  frail-appearing, pleasant and cooperative in NAD Head:  Normocephalic and atraumatic. Eyes:  Sclera clear, no icterus.   Conjunctiva pink. Mouth:   No ulcerations or lesions, but extensive white exudate noted on the tongue suggestive of thrush (no dysphagia symptoms). Neck:   No masses or thyromegaly. Lungs:  Clear throughout to auscultation.   No wheezes, crackles, or rhonchi. No evident respiratory distress. Heart:   Regular rate and rhythm; no murmurs, clicks, rubs,  or gallops. Abdomen:  Soft, nontender, nontympanitic, and nondistended. No masses, hepatosplenomegaly or ventral hernias noted. Normal bowel sounds, without bruits, guarding, or rebound.   Rectal:  Not performed Msk:   Symmetrical without gross deformities. Extremities:   Without clubbing, cyanosis, or significant edema. There does appear to be scant edema of the lower extremities, chronic and nonpitting. Neurologic:  Alert and coherent;  grossly normal neurologically. Skin:  Multiple quarter-sized erythematous partially crusted lesions most notable on the anterior surface of the lower extremities with some involvement of the lower abdominal wall in the center. Cervical Nodes:  No significant cervical adenopathy. Psych:   Alert and cooperative. Normal mood and affect.  Intake/Output from previous day:   Intake/Output this shift: Total I/O In: 0  Out: 100 [Urine:100]  Lab Results:  Recent Labs  11/18/14 1710 11/19/14 0635  WBC 15.6* 11.5*  HGB 12.7 10.4*  HCT 37.4 32.2*  PLT 413* 332   BMET  Recent Labs  11/18/14 1710 11/19/14 0635  NA 127* 133*  K 2.7* 3.5*  CL 96 109  CO2 10* <7*   GLUCOSE 77 56*  BUN 18 16  CREATININE 1.79* 1.28*  CALCIUM 8.9 8.0*   LFT  Recent Labs  11/18/14 1710  PROT 8.8*  ALBUMIN 2.7*  AST 23  ALT 16  ALKPHOS 133*  BILITOT 0.3   PT/INR No results for input(s): LABPROT, INR in the last 72 hours.  Studies/Results: No results found.  Impression: 1. Protracted nausea and diarrhea following exposure to Cipro 10 days ago.  Also, protracted vomiting at the beginning of this illness. 2. History of Crohn's disease, primarily involving the small bowel (ileum) and perianal area, status post multiple small bowel resections, maintained on anti-TNF therapy 3. Massive hiatal hernia with large amount of stomach and small amount of colon in the hernia sac. 4. Diffuse skin lesions of uncertain cause  Discussion: This severity of protracted vomiting would not be typical of a Crohn's flareup (apparently the patient does not feel it is her Crohn's, either), unless she had a small bowel obstruction or anastomotic stenosis as a consequence of the Crohn's. In a similar fashion, this severity of diarrhea would not be typical for Crohn's disease unless her with significant colonic involvement, which apparently there has not been in the past. Right now, I'm wondering about the possibility of a torsion of her hiatal hernia having led to proximal gastric obstruction leading to the vomiting of "phlegm" rather than bile or gastric contents. Another possibility, to explain the diarrhea, would  be C. difficile or some sort of enteric infection, such as Norovirus.  Plan: My recommendations are as follows:  1. CT enterography to assess for obstruction and/or small bowel inflammation and/or colitis 2. Await results of C. difficile toxin assay and enteric pathogen panel. In the meantime, I will empirically start the patient on Saccharomyces probiotic in view of the current antibiotic therapy 3. Zinc oxide ointment to excoriated intertriginous skin lesion in the  periumbilical area, which the patient has asked about but which is not specifically hurting her. 4. Dermatology consult upon discharge, consider evaluation for pyoderma gangrenosum, cutaneous TB, etc. (doubt) 5. Depending on the findings of her CT scan, a follow-up barium swallow to get a dynamic study of her large hiatal hernia with intrathoracic stomach, may be needed. Depending on what we see on the studies, surgical consultation for consideration of possible hiatal hernia repair.   LOS: 1 day   Napoleon Monacelli V  11/19/2014, 2:28 PM

## 2014-11-19 NOTE — Progress Notes (Signed)
INITIAL NUTRITION ASSESSMENT  DOCUMENTATION CODES Per approved criteria  -Not Applicable   INTERVENTION: Resource Breeze po BID, each supplement provides 250 kcal and 9 grams of protein  NUTRITION DIAGNOSIS: Inadequate oral intake related to gastroenteritis as evidenced by wt loss.   Goal: Pt to meet >/= 90% of their estimated nutrition needs   Monitor:  Weight trend, po intake, acceptance of supplements, labs  Reason for Assessment: MST  68 y.o. female  Admitting Dx: Gastroenteritis  ASSESSMENT: 68 y.o. female with past medical history of Crohn's, on and off diarrhea,-esophageal reflux disease, hiatal hernia and psoriasis who presents with one-week complaint of vomiting and watery stool.  - Pt reports improvement in both diarrhea and nausea. She has tolerated ginger ale today, but has had no solid food since Saturday. Will order clear liquid nutritional supplements. Pt's UBW is 150 lbs. Wt loss significant for time frame. No signs of fat or muscle depletion.   Height: Ht Readings from Last 1 Encounters:  11/18/14 5\' 4"  (1.626 m)    Weight: Wt Readings from Last 1 Encounters:  11/18/14 135 lb (61.236 kg)    Ideal Body Weight: 54.7 kg  % Ideal Body Weight: 112%  Wt Readings from Last 10 Encounters:  11/18/14 135 lb (61.236 kg)  09/03/14 154 lb 11.2 oz (70.171 kg)  07/10/14 150 lb 2.1 oz (68.1 kg)  06/28/14 153 lb 1.6 oz (69.446 kg)  03/21/14 137 lb 3.2 oz (62.234 kg)  03/14/14 128 lb (58.06 kg)  02/06/14 136 lb (61.689 kg)  11/13/13 138 lb 8 oz (62.823 kg)  11/06/13 141 lb 1.6 oz (64.003 kg)  09/18/13 137 lb 12.8 oz (62.506 kg)    Usual Body Weight: 150 lbs  % Usual Body Weight: 90%  BMI:  Body mass index is 23.16 kg/(m^2).  Estimated Nutritional Needs: Kcal: 1600-1800 Protein: 85-100 g Fluid: 1.8 L/day  Skin: Intact  Diet Order: Diet clear liquid  EDUCATION NEEDS: -Education needs addressed   Intake/Output Summary (Last 24 hours) at 11/19/14  1331 Last data filed at 11/19/14 0900  Gross per 24 hour  Intake      0 ml  Output    100 ml  Net   -100 ml    Last BM: prior to admission   Labs:   Recent Labs Lab 11/18/14 1710 11/19/14 0635  NA 127* 133*  K 2.7* 3.5*  CL 96 109  CO2 10* <7*  BUN 18 16  CREATININE 1.79* 1.28*  CALCIUM 8.9 8.0*  MG 1.7  --   GLUCOSE 77 56*    CBG (last 3)   Recent Labs  11/19/14 0826  GLUCAP 165*    Scheduled Meds: . acetaminophen  650 mg Oral Once  . cefTRIAXone (ROCEPHIN)  IV  1 g Intravenous Q24H  . fluconazole (DIFLUCAN) IV  100 mg Intravenous Q24H  . heparin  5,000 Units Subcutaneous 3 times per day  . magnesium sulfate 1 - 4 g bolus IVPB  2 g Intravenous Once  . nystatin   Topical TID  . pantoprazole (PROTONIX) IV  40 mg Intravenous Q24H  . potassium chloride  40 mEq Oral Once  . triamcinolone cream  1 application Topical BID    Continuous Infusions: .  sodium bicarbonate  infusion 1000 mL 125 mL/hr at 11/19/14 5885    Past Medical History  Diagnosis Date  . Crohn's disease   . Chronic diarrhea   . GERD (gastroesophageal reflux disease)   . Polyarthralgia   . Hiatal hernia   .  Hemorrhoids   . Anemia   . Hiatal hernia     hiatal  . Pneumonia 2014  . Gout   . Psoriasis     Past Surgical History  Procedure Laterality Date  . Bowel resection  2009  . 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 Anal stenosis  ~ 2010  . Colonoscopy  2013  . Cholecystectomy  1990's  . Appendectomy  1980's  . Goiter  1999  . Esophagogastroduodenoscopy  11/11/2011    Procedure: ESOPHAGOGASTRODUODENOSCOPY (EGD);  Surgeon: Missy Sabins, MD;  Location: [facility] ENDOSCOPY;  Service: Endoscopy;  Laterality: N/A;  . Flexible sigmoidoscopy  11/11/2011    Procedure: FLEXIBLE SIGMOIDOSCOPY;  Surgeon: Missy Sabins, MD;  Location: [place];  Service: Endoscopy;  Laterality: N/A;  . Esophagogastroduodenoscopy  05/18/2012    Procedure: ESOPHAGOGASTRODUODENOSCOPY (EGD);  Surgeon: Rogene Houston, MD;   Location: [facility] ENDO SUITE;  Service: Endoscopy;  Laterality: N/A;  200  . Colostomy takedown  2009    "only had it for about 1 month" (08/22/2013)  . Left heart catheterization with coronary angiogram N/A 07/09/2014    Procedure: LEFT HEART CATHETERIZATION WITH CORONARY ANGIOGRAM;  Surgeon: Peter M Martinique, MD;  Location: [facility] CATH LAB;  Service: Cardiovascular;  Laterality: N/A;    Laurette Schimke MS, RD, LDN

## 2014-11-19 NOTE — Progress Notes (Signed)
Spoke with provider K. Kirby-Graham about pt's low potassium and pt unable to tolerate iv potassium. Spoke with pt and explained the importance of her getting her potassium and pt agreed to try po potassium. Pt to take liquid potassium. Will continue to monitor pt.

## 2014-11-19 NOTE — Progress Notes (Signed)
PROGRESS NOTE  Danielle [name] ZOX:[id number] DOB: Aug 29, 1946 DOA: 11/18/2014 PCP: Maggie Font, MD Gastroenterologist:  Dr. Valorie Roosevelt Danielle [name] is a 68 y.o. female with past medical history of Crohn's, on and off diarrhea, esophageal reflux disease, hiatal hernia and psoriasis who presents with one-week complaint of vomiting and watery stool. She states that this does not feel like a Crohn's exacerbation as she has no abdominal pain. She has not had any fever, chills or sweats. She has not had any sick contacts. She did take 2 doses of an antibiotic given by her dentist 2 weeks ago prior to getting her teeth pulled, and developed a rash on her face and extremities. She is able to tolerate water and tomatoes only. She does have Lomotil ordered for when she has diarrhea exacerbations. She took 2 doses yesterday without any improvement.  Assessment/Plan:  Metabolic Acidosis Severe.  CO2 < 7.  Bicarb gtt started 12/14 Review of her previous labs indicates she has had metabolic acidosis multiple times in the past.  This may be chronic. Secondary to GI losses.  Acute Kidney Injury Baseline creatinine 1.19.  Admitted with 1.79.  Likely secondary to GI losses. Creatinine improved with IVF.    Gastroenteritis with a pmh of Crohns Patient with watery diarrhea x 1 week. GI pathogen panel ordered on admission.  C-diff PCR ordered 12/14. CT Abdomen / Pelvis with contrast is pending.  Clear liquid diet. [facility]enterology consulted - Appreciate Dr. Buccini's recommendations.  Crohns H/O small bowel and anorectal Crohns Disease cared for by Dr. Laural Golden. Currently on Humira for Psoriasis (and Crohns?).  Previously on Remicade. Patient reports this does not feel like a Crohns flare.  UTI U/A appears positive on admission Urine culture pending. Started on rocephin on admission.  Has an allergy to cipro.  D/C rocephin if c-diff positive.  Normocytic anemia Likely secondary to chronic  illness. Will monitor.  Likely stable for continued outpatient follow up.  Rash / Lower Extremity ulcerations. Believe the rash is likely related to antibiotics. Review of previous office notes indicates prednisone has helped in the past.  Will monitor for now. WOC requested.  Hypokalemia Repleted.  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 Disposition Plan: inpatient.  Likely to home when able.   Consultants:  Gastroenterology  Procedures:  None.  Antibiotics: Anti-infectives    Start     Dose/Rate Route Frequency Ordered Stop   11/19/14 2100  cefTRIAXone (ROCEPHIN) 1 g in dextrose 5 % 50 mL IVPB - Premix     1 g100 mL/hr over 30 Minutes Intravenous Every 24 hours 11/18/14 2219     11/19/14 1100  fluconazole (DIFLUCAN) IVPB 100 mg     100 mg50 mL/hr over 60 Minutes Intravenous Every 24 hours 11/19/14 1020     11/19/14 0000  cefTRIAXone (ROCEPHIN) 1 g in dextrose 5 % 50 mL IVPB  Status:  Discontinued     1 g100 mL/hr over 30 Minutes Intravenous Every 24 hours 11/18/14 2044 11/18/14 2219   11/18/14 2045  cefTRIAXone (ROCEPHIN) 1 g in dextrose 5 % 50 mL IVPB  Status:  Discontinued     1 g100 mL/hr over 30 Minutes Intravenous Every 24 hours 11/18/14 2043 11/18/14 2044   11/18/14 2015  cefTRIAXone (ROCEPHIN) 1 g in dextrose 5 % 50 mL IVPB     1 g100 mL/hr  over 30 Minutes Intravenous  Once 11/18/14 2012 11/18/14 2146       Objective: Filed Vitals:   11/18/14 2130 11/18/14 2200 11/18/14 2250 11/19/14 0511  BP: 103/61 95/59 108/64 113/60  Pulse: 92 93 98 93  Temp:   98 F (36.7 C) 98 F (36.7 C)  TempSrc:   Oral Oral  Resp:    16  Height:   5' 4" (1.626 m)   Weight:   61.236 kg (135 lb)   SpO2: 100% 100% 100% 100%    Intake/Output Summary (Last 24 hours) at 11/19/14 1240 Last data filed at 11/19/14 0900  Gross per 24 hour    Intake      0 ml  Output    100 ml  Net   -100 ml   Filed Weights   11/18/14 1627 11/18/14 2250  Weight: 58.968 kg (130 lb) 61.236 kg (135 lb)    Exam: General: Wd female, awakens easily from sleep.  A&O with clear speech.  No complaints. HEENT:  EOMI, Anicteic Sclera, MMM. No pharyngeal erythema or exudates  Neck: Supple, no JVD, no masses  Cardiovascular: RRR, S1 S2 auscultated, no rubs, murmurs or gallops.   Respiratory: Clear to auscultation bilaterally with equal chest rise  Abdomen: Soft, obese, nontender, nondistended, + bowel sounds  Extremities: warm dry without cyanosis clubbing or edema.  Neuro: AAOx3, cranial nerves grossly intact. Able to move all 4 extremities Skin: annular lesions and ulcerations on lower extremities, reddened papules on face.   Psych: Normal affect and demeanor with intact judgement and insight  Data Reviewed: Basic Metabolic Panel:  Recent Labs Lab 11/18/14 1710 11/19/14 0635  NA 127* 133*  K 2.7* 3.5*  CL 96 109  CO2 10* <7*  GLUCOSE 77 56*  BUN 18 16  CREATININE 1.79* 1.28*  CALCIUM 8.9 8.0*  MG 1.7  --    Liver Function Tests:  Recent Labs Lab 11/18/14 1710  AST 23  ALT 16  ALKPHOS 133*  BILITOT 0.3  PROT 8.8*  ALBUMIN 2.7*    Recent Labs Lab 11/18/14 1710  LIPASE 76*   CBC:  Recent Labs Lab 11/18/14 1710 11/19/14 0635  WBC 15.6* 11.5*  NEUTROABS 10.1*  --   HGB 12.7 10.4*  HCT 37.4 32.2*  MCV 78.2 79.5  PLT 413* 332   BNP (last 3 results)  Recent Labs  03/20/14 1052 07/08/14 1454  PROBNP 255.5* 210.7*   CBG:  Recent Labs Lab 11/19/14 0826  GLUCAP 165*      Studies: No results found.  Scheduled Meds: . acetaminophen  650 mg Oral Once  . cefTRIAXone (ROCEPHIN)  IV  1 g Intravenous Q24H  . fluconazole (DIFLUCAN) IV  100 mg Intravenous Q24H  . heparin  5,000 Units Subcutaneous 3 times per day  . magnesium sulfate 1 - 4 g bolus IVPB  2 g Intravenous Once  . nystatin   Topical TID  .  pantoprazole (PROTONIX) IV  40 mg Intravenous Q24H  . potassium chloride  10 mEq Intravenous Q1 Hr x 2  . potassium chloride  40 mEq Oral Once  . triamcinolone cream  1 application Topical BID   Continuous Infusions: .  sodium bicarbonate  infusion 1000 mL 125 mL/hr at 11/19/14 4403    Principal Problem:   Gastroenteritis Active Problems:   Crohn's disease   GERD (gastroesophageal reflux disease)   Metabolic acidosis   ARF (acute renal failure)   Rash of face   Hyponatremia   Hypokalemia  Karen Kitchens  [facility] Pager [phone]. If 7PM-7AM, please contact night-coverage at [email or web address], password [facility] 11/19/2014, 12:40 PM  LOS: 1 day    Patient seen and examined. She is alert, following commands.  She is still having diarrhea.  Metabolic acidosis; start bicarb Gtt. Repeat B-met tonight.  Hypokalemia; replete with IV and oral K, repeat B-met tonight.  Follow up CT enterography. Depending on results might need surgery evaluation.    , Md.

## 2014-11-19 NOTE — Progress Notes (Signed)
CRITICAL VALUE ALERT  Critical value received:  K 2.5   Date of notification:  11/19/14  Time of notification:  4503  Critical value read back:Yes.    Nurse who received alert:  Zenon Mayo, RN  MD notified (1st page):  Dr. Tyrell Antonio  Time of first page:  1315  MD notified (2nd page):  Time of second page:  Responding MD:  Dr. Tyrell Antonio  Time MD responded:  [phone]

## 2014-11-19 NOTE — Progress Notes (Signed)
CRITICAL VALUE ALERT  Critical value received:  CO2 <7  Date of notification:  11/19/14  Time of notification:  0740  Critical value read back:Yes.    Nurse who received alert:  Zenon Mayo, RN  MD notified (1st page):  [place]  Time of first page:  0740  Responding MD:  Tyrell Antonio  Time MD responded:  [phone]

## 2014-11-20 LAB — BASIC METABOLIC PANEL
Anion gap: 14 (ref 5–15)
Anion gap: 13 (ref 5–15)
BUN: 8 mg/dL (ref 6–23)
BUN: 8 mg/dL (ref 6–23)
CALCIUM: 8.1 mg/dL — AB (ref 8.4–10.5)
CHLORIDE: 109 meq/L (ref 96–112)
CO2: 14 mEq/L — ABNORMAL LOW (ref 19–32)
CO2: 15 meq/L — AB (ref 19–32)
CREATININE: 0.98 mg/dL (ref 0.50–1.10)
Calcium: 8.3 mg/dL — ABNORMAL LOW (ref 8.4–10.5)
Chloride: 105 mEq/L (ref 96–112)
Creatinine, Ser: 1.06 mg/dL (ref 0.50–1.10)
GFR calc Af Amer: 62 mL/min — ABNORMAL LOW (ref >90)
GFR calc Af Amer: 68 mL/min — ABNORMAL LOW (ref >90)
GFR calc non Af Amer: 53 mL/min — ABNORMAL LOW (ref >90)
GFR, EST NON AFRICAN AMERICAN: 59 mL/min — AB (ref >90)
Glucose, Bld: 85 mg/dL (ref 70–99)
Glucose, Bld: 112 mg/dL — ABNORMAL HIGH (ref 70–99)
POTASSIUM: 3.6 meq/L — AB (ref 3.7–5.3)
Potassium: 3.4 mEq/L — ABNORMAL LOW (ref 3.7–5.3)
SODIUM: 133 meq/L — AB (ref 137–147)
Sodium: 137 mEq/L (ref 137–147)

## 2014-11-20 LAB — CBC
HEMATOCRIT: 32.5 % — AB (ref 36.0–46.0)
HEMOGLOBIN: 11 g/dL — AB (ref 12.0–15.0)
MCH: 26.4 pg (ref 26.0–34.0)
MCHC: 33.8 g/dL (ref 30.0–36.0)
MCV: 78.1 fL (ref 78.0–100.0)
Platelets: 349 10*3/uL (ref 150–400)
RBC: 4.16 MIL/uL (ref 3.87–5.11)
RDW: 15.3 % (ref 11.5–15.5)
WBC: 9.4 10*3/uL (ref 4.0–10.5)

## 2014-11-20 LAB — CLOSTRIDIUM DIFFICILE BY PCR: Toxigenic C. Difficile by PCR: NEGATIVE

## 2014-11-20 MED ORDER — POTASSIUM CHLORIDE 20 MEQ/15ML (10%) PO SOLN
40.0000 meq | Freq: Once | ORAL | Status: AC
  Administered 2014-11-20: 40 meq via ORAL
  Filled 2014-11-20: qty 30

## 2014-11-20 MED ORDER — SODIUM CHLORIDE 0.9 % IJ SOLN
10.0000 mL | INTRAMUSCULAR | Status: DC | PRN
  Administered 2014-11-22: 10 mL
  Filled 2014-11-20: qty 40

## 2014-11-20 MED ORDER — DIPHENOXYLATE-ATROPINE 2.5-0.025 MG PO TABS
1.0000 | ORAL_TABLET | Freq: Four times a day (QID) | ORAL | Status: DC | PRN
  Administered 2014-11-20 – 2014-11-22 (×3): 1 via ORAL
  Filled 2014-11-20 (×5): qty 1

## 2014-11-20 NOTE — Progress Notes (Signed)
Peripherally Inserted Central Catheter/Midline Placement  The IV Nurse has discussed with the patient and/or persons authorized to consent for the patient, the purpose of this procedure and the potential benefits and risks involved with this procedure.  The benefits include less needle sticks, lab draws from the catheter and patient may be discharged home with the catheter.  Risks include, but not limited to, infection, bleeding, blood clot (thrombus formation), and puncture of an artery; nerve damage and irregular heat beat.  Alternatives to this procedure were also discussed.  PICC/Midline Placement Documentation  PICC / Midline Single Lumen 45/40/98 PICC Right Basilic 36 cm 2 cm (Active)  Indication for Insertion or Continuance of Line Poor Vasculature-patient has had multiple peripheral attempts or PIVs lasting less than 24 hours 11/20/2014 12:00 PM  Exposed Catheter (cm) 2 cm 11/20/2014 12:00 PM  Dressing Change Due 11/27/14 11/20/2014 12:00 PM       Holley Bouche Renee 11/20/2014, 12:51 PM

## 2014-11-20 NOTE — Progress Notes (Addendum)
Charge nurse asked pt if we could send her medications to pharmacy to be stored till d/c.  Pt's nurse, Gerald Stabs at doorway of pt's room. Pt agreeable with storing medications in pharmacy till d/c. Handed pt her bag and pt handed nurse her pill container with medications. Will store medications in pharmacy. Ranelle Oyster, RN (charge nurse)

## 2014-11-20 NOTE — Progress Notes (Signed)
The patient feels better today. There has been resolution of her nausea, but so far, the diarrhea is remaining about the same. Her C. difficile toxin assay came back negative; we are still waiting for the results of the GI pathogen panel.  Vascular access has been lost, so her CT enterography has not yet been performed. I still feel that it is important to do it, even though her vomiting is better. We need to look for evidence of obstruction from her large hiatal hernia, we need to check for further herniation of bowel contents into the chest, we need to assess for Crohn's small bowel activity and possible obstruction, and we need to assess for possible colitis in view of ongoing diarrhea.  Cleotis Nipper, M.D. [phone]

## 2014-11-20 NOTE — Progress Notes (Addendum)
PROGRESS NOTE  Danielle [name]:[id number] DOB: 1945-12-23 DOA: 11/18/2014 PCP: Maggie Font, MD Gastroenterologist:  Dr. Valorie Roosevelt Danielle [name] is a 68 y.o. female with past medical history of Crohn's, esophageal reflux disease, significant hiatal hernia and psoriasis who presents with one-week complaint of vomiting and watery stool. She states that this does not feel like a Crohn's exacerbation as she has no abdominal pain.  She took 2 doses of an antibiotic given by her dentist 2 weeks ago prior to getting her teeth pulled, and developed a rash on her face and extremities, along with diarrhea. At the time of admission she was only able to tolerate water and tomatoes.   Assessment/Plan:  Metabolic Acidosis Severe.  CO2 < 7.  Bicarb gtt started 12/14.  CO2 Improved on 12/16 to 20.  Will discontinue bicarb gtt, and check bmet in am. Review of her previous labs indicates she has had metabolic acidosis multiple times in the past.  This may be chronic.  Secondary to GI losses.  Hypokalemia  Secondary to GI losses and worsened by the Bicarb gtt.  Repleted with oral and IV.  Now improved to 3.6 (12/15).  Will continue to replete. Recheck bmet in am 12/17  Acute Kidney Injury Resolved.  Baseline creatinine 1.19.  Admitted with 1.79.  Likely secondary to GI losses. Creatinine improved with IVF.    Gastroenteritis with a pmh of Crohns Patient with watery diarrhea x 1 week. Now, with bowel rest, improved to 3x a day. (almost normal for her).  Advance soft diet on 12/16.  GI pathogen panel negative.  C-diff PCR negative. Question if this episode was caused by the antibiotics (cipro).  Patient refused CT Enterography stating that her GI physician (Dr. Laural Golden) had told her in the past not to have another CT - but rather to have an MRI.  After discussion with Dr. Cristina Gong, it was decided that since the patient was improving - to wait and consider imaging at outpatient follow up.   Appreciate Dr.  Osborn Coho recommendations.  Per his notes:  "We need to look for evidence of obstruction from her large hiatal hernia, check for further herniation of bowel contents into the chest, assess for Crohn's small bowel activity and possible obstruction, and to assess for possible colitis in view of ongoing diarrhea".  I will forward this recommendation to Dr. Laural Golden to consider for outpatient follow up.  Crohns H/O small bowel and anorectal Crohns Disease cared for by Dr. Laural Golden.   Currently on Humira for Psoriasis (and Crohns?).  Previously on Remicade.  Patient reports this does not feel like a Crohns flare.  UTI U/A appears positive on admission Urine culture is positive for gram negative rods.  Waiting for sensitivities. Started on rocephin on admission.  Has an allergy to cipro.    Normocytic anemia Likely secondary to chronic illness.  HGb appears to vacillate some what.  No signs of frank bleeding or bruising.  Likely stable for outpatient follow up.  Rash / Lower Extremity ulcerations. Believe the rash is likely related to antibiotics.  Patient is followed by a dermatologist outpatient who recommended Nystatin cream.  Have ordered nystatin cream (12/14).  Rash improving.  Appreciate WOC.  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 Disposition Plan: inpatient.  Likely to home when able.   Consultants:  Gastroenterology  Procedures:  None.  Antibiotics: Anti-infectives    Start     Dose/Rate Route Frequency Ordered Stop   11/19/14 2100  cefTRIAXone (ROCEPHIN) 1 g in dextrose 5 % 50 mL IVPB - Premix     1 g100 mL/hr over 30 Minutes Intravenous Every 24 hours 11/18/14 2219     11/19/14 1100  fluconazole (DIFLUCAN) IVPB 100 mg     100 mg50 mL/hr over 60 Minutes Intravenous Every 24 hours 11/19/14 1020     11/19/14 0000  cefTRIAXone  (ROCEPHIN) 1 g in dextrose 5 % 50 mL IVPB  Status:  Discontinued     1 g100 mL/hr over 30 Minutes Intravenous Every 24 hours 11/18/14 2044 11/18/14 2219   11/18/14 2045  cefTRIAXone (ROCEPHIN) 1 g in dextrose 5 % 50 mL IVPB  Status:  Discontinued     1 g100 mL/hr over 30 Minutes Intravenous Every 24 hours 11/18/14 2043 11/18/14 2044   11/18/14 2015  cefTRIAXone (ROCEPHIN) 1 g in dextrose 5 % 50 mL IVPB     1 g100 mL/hr over 30 Minutes Intravenous  Once 11/18/14 2012 11/18/14 2146       Objective: Filed Vitals:   11/20/14 0643 11/20/14 1433 11/20/14 2208 11/21/14 0542  BP: 103/56 91/53 105/62 96/61  Pulse: 93 90 98   Temp: 98.9 F (37.2 C) 98.2 F (36.8 C) 98.4 F (36.9 C) 98.8 F (37.1 C)  TempSrc: Oral Oral Oral Oral  Resp: 14 18 18 18   Height:      Weight:      SpO2: 99% 100% 100%     Intake/Output Summary (Last 24 hours) at 11/21/14 1450 Last data filed at 11/21/14 1056  Gross per 24 hour  Intake    220 ml  Output    200 ml  Net     20 ml   Filed Weights   11/18/14 1627 11/18/14 2250  Weight: 58.968 kg (130 lb) 61.236 kg (135 lb)    Exam: General: Wd female,  A&O with clear speech.  Appears stated age. HEENT:  EOMI, Anicteic Sclera, MMM. No pharyngeal erythema or exudates, edentulous. Neck: Supple, no JVD, no masses  Cardiovascular: RRR, S1 S2 auscultated, no rubs, murmurs or gallops.  No LEE Respiratory: Clear to auscultation bilaterally with equal chest rise  Abdomen: Soft, obese, nontender, nondistended, + bowel sounds  Extremities: warm dry without cyanosis clubbing or edema.  Neuro: AAOx3, cranial nerves grossly intact. Able to move all 4 extremities Skin: annular lesions and ulcerations on lower extremities, reddened papules on face.   Psych: Normal affect and demeanor with intact judgement and insight  Data Reviewed: Basic Metabolic Panel:  Recent Labs Lab 11/18/14 1710  11/19/14 1315 11/19/14 1828 11/20/14 0850 11/20/14 2020 11/21/14 0550  NA  127*  < > 136* 134* 137 133* 137  K 2.7*  < > 2.5* 2.6* 3.6* 3.4* 3.3*  CL 96  < > 105 104 109 105 104  CO2 10*  < > 11* 13* 14* 15* 20  GLUCOSE 77  < > 94 86 85 112* 78  BUN 18  < > 13 11 8 8 7   CREATININE 1.79*  < > 1.21* 1.01 1.06 0.98 1.02  CALCIUM 8.9  < > 8.3* 8.0* 8.3* 8.1* 8.0*  MG 1.7  --   --   --   --   --   --   < > = values in this interval not displayed. Liver Function Tests:  Recent Labs Lab 11/18/14 1710  AST 23  ALT 16  ALKPHOS 133*  BILITOT 0.3  PROT 8.8*  ALBUMIN 2.7*    Recent Labs Lab 11/18/14 1710  LIPASE 76*   CBC:  Recent Labs Lab 11/18/14 1710 11/19/14 0635 11/20/14 0850 11/21/14 0550  WBC 15.6* 11.5* 9.4 8.8  NEUTROABS 10.1*  --   --   --   HGB 12.7 10.4* 11.0* 9.2*  HCT 37.4 32.2* 32.5* 28.3*  MCV 78.2 79.5 78.1 77.5*  PLT 413* 332 349 317   BNP (last 3 results)  Recent Labs  03/20/14 1052 07/08/14 1454  PROBNP 255.5* 210.7*   CBG:  Recent Labs Lab 11/19/14 0826  GLUCAP 165*      Studies: No results found.  Scheduled Meds: . acetaminophen  650 mg Oral Once  . cefTRIAXone (ROCEPHIN)  IV  1 g Intravenous Q24H  . feeding supplement (RESOURCE BREEZE)  1 Container Oral BID BM  . fluconazole (DIFLUCAN) IV  100 mg Intravenous Q24H  . heparin  5,000 Units Subcutaneous 3 times per day  . nystatin   Topical TID  . nystatin cream   Topical BID  . pantoprazole (PROTONIX) IV  40 mg Intravenous Q24H  . triamcinolone cream  1 application Topical BID   Continuous Infusions: . sodium chloride 75 mL/hr at 11/21/14 1249    Principal Problem:   Gastroenteritis Active Problems:   Crohn's disease   GERD (gastroesophageal reflux disease)   Metabolic acidosis   ARF (acute renal failure)   Rash of face   Hyponatremia   Hypokalemia    Karen Kitchens  [facility] Pager [phone]. If 7PM-7AM, please contact night-coverage at [email or web address], password [facility] 11/21/2014, 2:50 PM  LOS: 3 days

## 2014-11-20 NOTE — Progress Notes (Signed)
Pt called about IVsite. RN to assess site and found that it was infiltrated. IV was removed, pt's arm was elevated,  and rn explained to pt to keep site elevated. Heat pack applied. Iv team was notified to restart iv as pt is a hard stick. Will continue to monitor pt.

## 2014-11-21 ENCOUNTER — Inpatient Hospital Stay (HOSPITAL_COMMUNITY): Payer: Medicare Other

## 2014-11-21 LAB — CBC
HCT: 28.3 % — ABNORMAL LOW (ref 36.0–46.0)
Hemoglobin: 9.2 g/dL — ABNORMAL LOW (ref 12.0–15.0)
MCH: 25.2 pg — ABNORMAL LOW (ref 26.0–34.0)
MCHC: 32.5 g/dL (ref 30.0–36.0)
MCV: 77.5 fL — AB (ref 78.0–100.0)
Platelets: 317 10*3/uL (ref 150–400)
RBC: 3.65 MIL/uL — ABNORMAL LOW (ref 3.87–5.11)
RDW: 15 % (ref 11.5–15.5)
WBC: 8.8 10*3/uL (ref 4.0–10.5)

## 2014-11-21 LAB — BASIC METABOLIC PANEL
Anion gap: 13 (ref 5–15)
Anion gap: 10 (ref 5–15)
BUN: 7 mg/dL (ref 6–23)
BUN: 7 mg/dL (ref 6–23)
CALCIUM: 8 mg/dL — AB (ref 8.4–10.5)
CO2: 20 meq/L (ref 19–32)
CO2: 19 mEq/L (ref 19–32)
CREATININE: 1.02 mg/dL (ref 0.50–1.10)
Calcium: 7.8 mg/dL — ABNORMAL LOW (ref 8.4–10.5)
Chloride: 104 mEq/L (ref 96–112)
Chloride: 102 mEq/L (ref 96–112)
Creatinine, Ser: 0.93 mg/dL (ref 0.50–1.10)
GFR calc Af Amer: 65 mL/min — ABNORMAL LOW (ref >90)
GFR, EST AFRICAN AMERICAN: 73 mL/min — AB (ref >90)
GFR, EST NON AFRICAN AMERICAN: 56 mL/min — AB (ref >90)
GFR, EST NON AFRICAN AMERICAN: 63 mL/min — AB (ref >90)
GLUCOSE: 78 mg/dL (ref 70–99)
Glucose, Bld: 94 mg/dL (ref 70–99)
Potassium: 3.3 mEq/L — ABNORMAL LOW (ref 3.7–5.3)
Potassium: 3.8 mEq/L (ref 3.7–5.3)
SODIUM: 137 meq/L (ref 137–147)
Sodium: 131 mEq/L — ABNORMAL LOW (ref 137–147)

## 2014-11-21 MED ORDER — CEPHALEXIN 500 MG PO CAPS
500.0000 mg | ORAL_CAPSULE | Freq: Two times a day (BID) | ORAL | Status: DC
  Administered 2014-11-21 – 2014-11-22 (×2): 500 mg via ORAL
  Filled 2014-11-21 (×4): qty 1

## 2014-11-21 MED ORDER — IOHEXOL 350 MG/ML SOLN: 100.0000 mL | Freq: Once | INTRAVENOUS | Status: AC | PRN

## 2014-11-21 MED ORDER — SODIUM CHLORIDE 0.9 % IV BOLUS (SEPSIS)
500.0000 mL | Freq: Once | INTRAVENOUS | Status: AC
  Administered 2014-11-21: 500 mL via INTRAVENOUS

## 2014-11-21 MED ORDER — POTASSIUM CHLORIDE 20 MEQ/15ML (10%) PO SOLN
40.0000 meq | Freq: Once | ORAL | Status: AC
  Administered 2014-11-21: 40 meq via ORAL
  Filled 2014-11-21: qty 30

## 2014-11-21 MED ORDER — SODIUM CHLORIDE 0.9 % IV SOLN
INTRAVENOUS | Status: DC
  Administered 2014-11-21: 16:00:00 via INTRAVENOUS

## 2014-11-21 MED ORDER — SODIUM CHLORIDE 0.9 % IV SOLN
INTRAVENOUS | Status: DC
  Administered 2014-11-21: 13:00:00 via INTRAVENOUS

## 2014-11-21 NOTE — Progress Notes (Deleted)
CARE MANAGEMENT NOTE 11/21/2014  Patient:  Danielle [name]   Account Number:  [id number]  Date Initiated:  11/21/2014  Documentation initiated by:  [facility]  Subjective/Objective Assessment:   admitted with nausea and vomiting     Action/Plan:   Anticipated DC Date:  11/21/2014   Anticipated DC Plan:  [place]  CM consult      Choice offered to / List presented to:             Status of service:  Completed, signed off Medicare Important Message given?   (If response is "NO", the following Medicare IM given date fields will be blank) Date Medicare IM given:   Medicare IM given by:   Date Additional Medicare IM given:   Additional Medicare IM given by:    Discharge Disposition:  HOME/SELF CARE  Per UR Regulation:    If discussed at Long Length of Stay Meetings, dates discussed:    Comments:  11/21/14 Patient refused home health RN/aide.

## 2014-11-21 NOTE — Progress Notes (Signed)
Pt refused to go to CT at this time. Pt stated her DR from home told her to not get anymore CT's scans done and get an MRI. Pt was educated on the important's of the CT scan. Pt would like to talk to the Dr in the morning before having the CT done

## 2014-11-21 NOTE — Progress Notes (Signed)
CNA notified Rn of pt having home medication in room and witnessed pt taking two pills unknown. I made pt aware that she could not take any home medication whilein the care of the hosptial. Pt stated that her "  Female Dr told me I could take her on pill if i had them here". PT could not name the Dr. Bing Plume nurse made aware. And Medications where counted and send to pharmacy to store till d/c, pt agreed to do so.

## 2014-11-21 NOTE — Progress Notes (Addendum)
PROGRESS NOTE  Danielle [name] QIW:[id number] DOB: 08-16-1946 DOA: 11/18/2014 PCP: Maggie Font, MD Gastroenterologist:  Dr. Valorie Roosevelt Danielle [name] is a 68 y.o. female with past medical history of Crohn's, esophageal reflux disease, significant hiatal hernia and psoriasis who presents with one-week complaint of vomiting and watery stool. She states that this does not feel like a Crohn's exacerbation as she has no abdominal pain.  She took 2 doses of an antibiotic given by her dentist 2 weeks ago prior to getting her teeth pulled, and developed a rash on her face and extremities, along with diarrhea. At the time of admission she was only able to tolerate water and tomatoes.   Assessment/Plan:  Metabolic Acidosis Severe.  CO2 < 7.  Bicarb gtt started 12/14.  CO2 Improved on 12/16 to 20.  Will discontinue bicarb gtt, and check bmet in am. Review of her previous labs indicates she has had metabolic acidosis multiple times in the past.  This may be chronic.  Secondary to GI losses.  Hypokalemia  Secondary to GI losses and worsened by the Bicarb gtt.  Repleted with oral and IV.  Now improved to 3.6 (12/15).  Will continue to replete. Recheck bmet in am 12/17  Acute Kidney Injury Resolved.  Baseline creatinine 1.19.  Admitted with 1.79.  Likely secondary to GI losses. Creatinine improved with IVF.    Gastroenteritis with a pmh of Crohns Patient with watery diarrhea x 1 week. Now, with bowel rest, improved to 3x a day. (almost normal for her).  Advance soft diet on 12/16.  GI pathogen panel negative.  C-diff PCR negative. Question if this episode was caused by the antibiotics (cipro).  Patient refused CT Enterography stating that her GI physician (Dr. Laural Golden) had told her in the past not to have another CT - but rather to have an MRI.  After discussion with Dr. Cristina Gong, it was decided that since the patient was improving - to wait and consider imaging at outpatient follow up.   Appreciate Dr.  Osborn Coho recommendations.  Per his notes:  "We need to look for evidence of obstruction from her large hiatal hernia, check for further herniation of bowel contents into the chest, assess for Crohn's small bowel activity and possible obstruction, and to assess for possible colitis in view of ongoing diarrhea".  I will forward this recommendation to Dr. Laural Golden to consider for outpatient follow up.  Crohns H/O small bowel and anorectal Crohns Disease cared for by Dr. Laural Golden.  Currently on Humira for Psoriasis (and Crohns?).  Previously on Remicade. Patient reports this does not feel like a Crohns flare.  UTI U/A appears positive on admission Urine culture growing citrobacter.  Started on rocephin on admission. Has an allergy to cipro.   Day 3 antibiotics. Will change antibiotics to keflex for 5 total day.   Normocytic anemia Likely secondary to chronic illness.  HGb appears to vacillate some what.  No signs of frank bleeding or bruising.   Monitor Hb trend. Prior hb at 9 and 10 range.   Rash / Lower Extremity ulcerations. Believe the rash is likely related to antibiotics.  Patient is followed by a dermatologist outpatient who recommended Nystatin cream.  Have ordered nystatin cream (12/14).  Rash improving.  Appreciate WOC.  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 Scd.   Code Status: full Family Communication: Patient is alert and orientated and understands her plan of care. Disposition Plan: inpatient.  Likely to home when able.    Consultants:  Gastroenterology  Procedures:  None.  Antibiotics: Anti-infectives    Start     Dose/Rate Route Frequency Ordered Stop   11/19/14 2100  cefTRIAXone (ROCEPHIN) 1 g in dextrose 5 % 50 mL IVPB - Premix     1 g100 mL/hr over 30 Minutes Intravenous Every 24 hours 11/18/14 2219     11/19/14 1100  fluconazole (DIFLUCAN) IVPB 100 mg     100 mg50 mL/hr over 60 Minutes Intravenous  Every 24 hours 11/19/14 1020     11/19/14 0000  cefTRIAXone (ROCEPHIN) 1 g in dextrose 5 % 50 mL IVPB  Status:  Discontinued     1 g100 mL/hr over 30 Minutes Intravenous Every 24 hours 11/18/14 2044 11/18/14 2219   11/18/14 2045  cefTRIAXone (ROCEPHIN) 1 g in dextrose 5 % 50 mL IVPB  Status:  Discontinued     1 g100 mL/hr over 30 Minutes Intravenous Every 24 hours 11/18/14 2043 11/18/14 2044   11/18/14 2015  cefTRIAXone (ROCEPHIN) 1 g in dextrose 5 % 50 mL IVPB     1 g100 mL/hr over 30 Minutes Intravenous  Once 11/18/14 2012 11/18/14 2146       Objective: Filed Vitals:   11/20/14 2208 11/21/14 0542 11/21/14 1512 11/21/14 1513  BP: 105/62 96/61 88/54  91/47  Pulse: 98  108   Temp: 98.4 F (36.9 C) 98.8 F (37.1 C) 99 F (37.2 C)   TempSrc: Oral Oral Oral   Resp: 18 18 18    Height:      Weight:      SpO2: 100%  100%     Intake/Output Summary (Last 24 hours) at 11/21/14 1559 Last data filed at 11/21/14 1056  Gross per 24 hour  Intake    220 ml  Output    200 ml  Net     20 ml   Filed Weights   11/18/14 1627 11/18/14 2250  Weight: 58.968 kg (130 lb) 61.236 kg (135 lb)    Exam: General: Wd female,  A&O with clear speech.  Appears stated age. HEENT:  EOMI, Anicteic Sclera. Neck: Supple, no JVD, no masses  Cardiovascular: RRR, S1 S2 auscultated, no rubs, murmurs or gallops.  No LEE Respiratory: Clear to auscultation bilaterally.  Abdomen: Soft, obese, nontender, nondistended, + bowel sounds  Extremities: warm dry without cyanosis clubbing or edema.  Neuro: AAOx3, cranial nerves grossly intact. Able to move all 4 extremities Skin: annular lesions and ulcerations on lower extremities, reddened papules on face.    Data Reviewed: Basic Metabolic Panel:  Recent Labs Lab 11/18/14 1710  11/19/14 1315 11/19/14 1828 11/20/14 0850 11/20/14 2020 11/21/14 0550  NA 127*  < > 136* 134* 137 133* 137  K 2.7*  < > 2.5* 2.6* 3.6* 3.4* 3.3*  CL 96  < > 105 104 109 105 104  CO2  10*  < > 11* 13* 14* 15* 20  GLUCOSE 77  < > 94 86 85 112* 78  BUN 18  < > 13 11 8 8 7   CREATININE 1.79*  < > 1.21* 1.01 1.06 0.98 1.02  CALCIUM 8.9  < > 8.3* 8.0* 8.3* 8.1* 8.0*  MG 1.7  --   --   --   --   --   --   < > = values in this interval not displayed. Liver Function Tests:  Recent Labs Lab 11/18/14 1710  AST 23  ALT 16  ALKPHOS 133*  BILITOT 0.3  PROT 8.8*  ALBUMIN 2.7*    Recent Labs Lab 11/18/14 1710  LIPASE 76*   CBC:  Recent Labs Lab 11/18/14 1710 11/19/14 0635 11/20/14 0850 11/21/14 0550  WBC 15.6* 11.5* 9.4 8.8  NEUTROABS 10.1*  --   --   --   HGB 12.7 10.4* 11.0* 9.2*  HCT 37.4 32.2* 32.5* 28.3*  MCV 78.2 79.5 78.1 77.5*  PLT 413* 332 349 317   BNP (last 3 results)  Recent Labs  03/20/14 1052 07/08/14 1454  PROBNP 255.5* 210.7*   CBG:  Recent Labs Lab 11/19/14 0826  GLUCAP 165*      Studies: No results found.  Scheduled Meds: . acetaminophen  650 mg Oral Once  . cefTRIAXone (ROCEPHIN)  IV  1 g Intravenous Q24H  . feeding supplement (RESOURCE BREEZE)  1 Container Oral BID BM  . fluconazole (DIFLUCAN) IV  100 mg Intravenous Q24H  . heparin  5,000 Units Subcutaneous 3 times per day  . nystatin   Topical TID  . nystatin cream   Topical BID  . pantoprazole (PROTONIX) IV  40 mg Intravenous Q24H  . triamcinolone cream  1 application Topical BID   Continuous Infusions: . sodium chloride 10 mL/hr at 11/21/14 1432  . sodium chloride 100 mL/hr at 11/21/14 1545    Principal Problem:   Gastroenteritis Active Problems:   Crohn's disease   GERD (gastroesophageal reflux disease)   Metabolic acidosis   ARF (acute renal failure)   Rash of face   Hyponatremia   Hypokalemia  Agree with assessment and plan done by Imogene Burn.   Danielle Higginbotham, Md.  [facility] Pager [phone]. If 7PM-7AM, please contact night-coverage at [email or web address], password [facility] 11/21/2014, 3:59 PM  LOS: 3 days

## 2014-11-21 NOTE — Progress Notes (Signed)
Nausea and vomiting resolved, tolerating solid diet.  Diarrhea still present, but much improved.  As noted, the patient has declined the small bowel enterography, based on a prior recommendation of her primary gastroenterologist to avoid further such procedures.  Impression: Clinically improved. Exact nature of what was, or still is, going on with the patient is not entirely clear.  Recommendation:  1. I recommend to the patient that she discuss with her primary gastroenterologist some sort of further evaluation of her large and possibly complicated hiatal hernia, as well as an updated evaluation of her inflammatory bowel disease. (In my opinion, a small bowel enterography would probably be the best way to capture all of these issues.)  2. I will sign off at this time in view of the patient's clinical improvement, but would be happy to see her again at your request, or to discuss her case in more detail if desired.  Cleotis Nipper, M.D. [phone]

## 2014-11-22 LAB — CBC
HCT: 26.1 % — ABNORMAL LOW (ref 36.0–46.0)
Hemoglobin: 8.4 g/dL — ABNORMAL LOW (ref 12.0–15.0)
MCH: 25.3 pg — ABNORMAL LOW (ref 26.0–34.0)
MCHC: 32.2 g/dL (ref 30.0–36.0)
MCV: 78.6 fL (ref 78.0–100.0)
Platelets: 249 10*3/uL (ref 150–400)
RBC: 3.32 MIL/uL — ABNORMAL LOW (ref 3.87–5.11)
RDW: 15.2 % (ref 11.5–15.5)
WBC: 7.9 10*3/uL (ref 4.0–10.5)

## 2014-11-22 LAB — BASIC METABOLIC PANEL
Anion gap: 11 (ref 5–15)
BUN: 7 mg/dL (ref 6–23)
CALCIUM: 7.8 mg/dL — AB (ref 8.4–10.5)
CO2: 18 mEq/L — ABNORMAL LOW (ref 19–32)
Chloride: 107 mEq/L (ref 96–112)
Creatinine, Ser: 1.01 mg/dL (ref 0.50–1.10)
GFR calc Af Amer: 66 mL/min — ABNORMAL LOW (ref >90)
GFR, EST NON AFRICAN AMERICAN: 57 mL/min — AB (ref >90)
GLUCOSE: 82 mg/dL (ref 70–99)
Potassium: 4.4 mEq/L (ref 3.7–5.3)
SODIUM: 136 meq/L — AB (ref 137–147)

## 2014-11-22 LAB — URINE CULTURE: Colony Count: >=100000

## 2014-11-22 MED ORDER — FLUCONAZOLE 100 MG PO TABS: 100.0000 mg | ORAL_TABLET | Freq: Every day | ORAL | Status: DC

## 2014-11-22 MED ORDER — NYSTATIN 100000 UNIT/GM EX POWD: CUTANEOUS | Status: DC

## 2014-11-22 MED ORDER — NYSTATIN 100000 UNIT/GM EX CREA: TOPICAL_CREAM | Freq: Two times a day (BID) | CUTANEOUS | Status: DC

## 2014-11-22 MED ORDER — BOOST / RESOURCE BREEZE PO LIQD: 1.0000 | Freq: Two times a day (BID) | ORAL | Status: DC

## 2014-11-22 MED ORDER — CEPHALEXIN 500 MG PO CAPS: 500.0000 mg | ORAL_CAPSULE | Freq: Two times a day (BID) | ORAL | Status: DC

## 2014-11-22 MED ORDER — POTASSIUM CHLORIDE CRYS ER 20 MEQ PO TBCR: 20.0000 meq | EXTENDED_RELEASE_TABLET | Freq: Every day | ORAL | Status: DC

## 2014-11-22 MED ORDER — SODIUM BICARBONATE 650 MG PO TABS: 650.0000 mg | ORAL_TABLET | Freq: Two times a day (BID) | ORAL | Status: DC

## 2014-11-22 MED ORDER — SODIUM BICARBONATE 650 MG PO TABS
650.0000 mg | ORAL_TABLET | Freq: Two times a day (BID) | ORAL | Status: DC
  Administered 2014-11-22: 650 mg via ORAL
  Filled 2014-11-22 (×2): qty 1

## 2014-11-22 NOTE — Progress Notes (Signed)
OT Cancellation Note  Patient Details Name: Danielle [name] MRN: [id number] DOB: [birth year]   Cancelled Treatment:    Reason Eval/Treat Not Completed: OT screened, no needs identified, will sign off.  Pt preparing for d/c.  Sister present.  Pt and sister report pt is functioning at her baseline and has all necessary equipment at home.  Declined OT.  Danielle [name] 11/22/2014, 3:07 PM  [phone]

## 2014-11-22 NOTE — Progress Notes (Signed)
Patient given discharge instructions, prescriptions, and home medications.  PICC line removed.  Pt denied any needs at this time.  Pt taken to discharge location via wheelchair.

## 2014-11-22 NOTE — Discharge Instructions (Signed)
Chronic Diarrhea  Diarrhea is frequent loose and watery bowel movements. It can cause you to feel weak and dehydrated. Dehydration can cause you to become tired and thirsty and to have a dry mouth, decreased urination, and dark yellow urine. Diarrhea is a sign of another problem, most often an infection that will not last long. In most cases, diarrhea lasts 2-3 days. Diarrhea that lasts longer than 4 weeks is called long-lasting (chronic) diarrhea. It is important to treat your diarrhea as directed by your health care provider to lessen or prevent future episodes of diarrhea.   CAUSES   There are many causes of chronic diarrhea. The following are some possible causes:   · Gastrointestinal infections caused by viruses, bacteria, or parasites.    · Food poisoning or food allergies.    · Certain medicines, such as antibiotics, chemotherapy, and laxatives.    · Artificial sweeteners and fructose.    · Digestive disorders, such as celiac disease and inflammatory bowel diseases.    · Irritable bowel syndrome.  · Some disorders of the pancreas.  · Disorders of the thyroid.  · Reduced blood flow to the intestines.  · Cancer.  Sometimes the cause of chronic diarrhea is unknown.  RISK FACTORS  · Having a severely weakened immune system, such as from HIV or AIDS.    · Taking certain types of cancer-fighting drugs (such as with chemotherapy) or other medicines.    · Having had a recent organ transplant.    · Having a portion of the stomach or small bowel removed.    · Traveling to countries where food and water supplies are often contaminated.    SYMPTOMS   In addition to frequent, loose stools, diarrhea may cause:   · Cramping.    · Abdominal pain.    · Nausea.    · Fever.  · Fatigue.  · Urgent need to use the bathroom.  · Loss of bowel control.  DIAGNOSIS   Your health care provider must take a careful history and perform a physical exam. Tests given are based on your symptoms and history. Tests may include:   · Blood or  stool tests. Three or more stool samples may be examined. Stool cultures may be used to test for bacteria or parasites.    · X-rays.    · A procedure in which a thin tube is inserted into the mouth or rectum (endoscopy). This allows the health care provider to look inside the intestine.    TREATMENT   · Treatment is aimed at correcting the cause of the diarrhea when possible.  · Diarrhea caused by an infection can often be treated with antibiotic medicines.  · Diarrhea not caused by an infection may require you to take long-term medicine or have surgery. Specific treatment should be discussed with your health care provider.  · If the cause cannot be determined, treatment aims to relieve symptoms and prevent dehydration. Serious health problems can occur if you do not maintain proper fluid levels. Treatment may include:  ¨ Taking an oral rehydration solution (ORS).  ¨ Not drinking beverages that contain caffeine (such as tea, coffee, and soft drinks).  ¨ Not drinking alcohol.  ¨ Maintaining well-balanced nutrition to help you recover faster.  HOME CARE INSTRUCTIONS   · Drink enough fluids to keep urine clear or pale yellow. Drink 1 cup (8 oz) of fluid for each diarrhea episode. Avoid fluids that contain simple sugars, fruit juices, whole milk products, and sodas. Hydrate with an ORS. You may purchase the ORS or prepare it at home by mixing the   following ingredients together:  ¨  - tsp (1.7-3  mL) table salt.  ¨ ¾ tsp (3 ¾ mL) baking soda.  ¨  tsp (1.7 mL) salt substitute containing potassium chloride.  ¨ 1 tbsp (20 mL) sugar.  ¨ 4.2 c (1 L) of water.    · Certain foods and beverages may increase the speed at which food moves through the gastrointestinal (GI) tract. These foods and beverages should be avoided. They include:  ¨ Caffeinated and alcoholic beverages.  ¨ High-fiber foods, such as raw fruits and vegetables, nuts, seeds, and whole grain breads and cereals.  ¨ Foods and beverages sweetened with sugar  alcohols, such as xylitol, sorbitol, and mannitol.    · Some foods may be well tolerated and may help thicken stool. These include:  ¨ Starchy foods, such as rice, toast, pasta, low-sugar cereal, oatmeal, grits, baked potatoes, crackers, and bagels.  ¨ Bananas.  ¨ Applesauce.  · Add probiotic-rich foods to help increase healthy bacteria in the GI tract. These include yogurt and fermented milk products.  · Wash your hands well after each diarrhea episode.  · Only take over-the-counter or prescription medicines as directed by your health care provider.  · Take a warm bath to relieve any burning or pain from frequent diarrhea episodes.  SEEK MEDICAL CARE IF:   · You are not urinating as often.  · Your urine is a dark color.  · You become very tired or dizzy.  · You have severe pain in the abdomen or rectum.  · Your have blood or pus in your stools.  · Your stools look black and tarry.  SEEK IMMEDIATE MEDICAL CARE IF:   · You are unable to keep fluids down.  · You have persistent vomiting.  · You have blood in your stool.  · Your stools are black and tarry.  · You do not urinate in 6-8 hours, or there is only a small amount of very dark urine.  · You have abdominal pain that increases or localizes.  · You have weakness, dizziness, confusion, or lightheadedness.  · You have a severe headache.  · Your diarrhea gets worse or does not get better.  · You have a fever or persistent symptoms for more than 2-3 days.  · You have a fever and your symptoms suddenly get worse.  MAKE SURE YOU:   · Understand these instructions.  · Will watch your condition.  · Will get help right away if you are not doing well or get worse.  Document Released: 02/13/2004 Document Revised: 11/28/2013 Document Reviewed: 05/18/2013  [employer]® Patient Information ©2015 [employer]. This information is not intended to replace advice given to you by your health care provider. Make sure you discuss any questions you have with your health care  provider.

## 2014-11-22 NOTE — Progress Notes (Signed)
Per MD order, PICC line removed. Cath intact at 36cm. Vaseline pressure gauze to site, pressure held x 5min. No bleeding to site. Pt instructed to keep dressing CDI x 24 hours. Avoid heavy lifting, pushing or pulling x 24 hours,  If bleeding occurs hold pressure, if bleeding does not stop contact MD or go to the ED. Pt does not have any questions. Srihari Shellhammer M 

## 2014-11-22 NOTE — Discharge Summary (Signed)
Physician Discharge Summary  Danielle [name] QBH:[id number] DOB: [birth year] DOA: 11/18/2014  PCP: Maggie Font, MD  Admit date: 11/18/2014 Discharge date: 11/22/2014  Time spent: 40 minutes  Recommendations for Outpatient Follow-up:  1. CBC, BMET.  Patient with anemia and severe metabolic acidosis during this hospitalization. 2. GI follow up for further evaluation of large hiatal hernia.  Discharge Diagnoses:  Principal Problem:   Gastroenteritis Active Problems:   Crohn's disease   GERD (gastroesophageal reflux disease)   Metabolic acidosis   ARF (acute renal failure)   Rash of face   Hyponatremia   Hypokalemia   Discharge Condition: stable  Diet recommendation: Soft diet as tolerated  Filed Weights   11/18/14 1627 11/18/14 2250  Weight: 58.968 kg (130 lb) 61.236 kg (135 lb)    History of present illness:  Danielle [name] is a 68 y.o. female with past medical history of Crohn's, esophageal reflux disease, significant hiatal hernia and psoriasis who presented with one-week  of vomiting and watery stool. She states that this does not feel like a Crohn's exacerbation as she has no abdominal pain. She took 2 doses of an antibiotic given by her dentist 2 weeks ago prior to getting her teeth pulled, and developed a rash on her face and extremities, along with diarrhea. At the time of admission she was only able to tolerate water and tomatoes.   Hospital Course:   Metabolic Acidosis Severe. CO2 < 7. Bicarb gtt started 12/14. CO2 Improved on 12/16 to 20. Bicarb gtt was discontinued on 12/16 and the patient's bicarb dropped to 17 on 12/17.  Will discharge Danielle [name] on oral bicarb tablets for several days.  Recommend quick follow up lab work.  Review of her previous labs indicates she has had metabolic acidosis multiple times in the past. This may be chronic. Secondary to GI losses.  Hypokalemia  Secondary to GI losses and worsened by the Bicarb gtt. Repleted with oral  and IV. Now improved to 4.4.   Acute Kidney Injury Resolved. Baseline creatinine 1.19. Admitted with 1.79. Likely secondary to GI losses. Creatinine improved with IVF.   Gastroenteritis with a pmh of Crohns Patient with watery diarrhea x 1 week. Now improved to 3x a day. (almost normal for her). Advanced soft diet on 12/16. GI pathogen panel negative. C-diff PCR negative. Question if this episode was caused by the antibiotics (cipro). Patient refused CT Enterography stating that her GI physician (Dr. Laural [name]) had told her in the past not to have another CT - but rather to have an MRI. After discussion with Dr. Cristina [name], it was decided that since the patient was improving - to wait and consider imaging at outpatient follow up. Per Dr. Cristina [name] she would benefit from some sort of further evaluation of her large and possibly complicated hiatal hernia, as well as an updated evaluation of her inflammatory bowel disease. (In my opinion, a small bowel enterography would probably be the best way to capture all of these issues).  Crohns H/O small bowel and anorectal Crohns Disease cared for by Dr. Laural [name]. Currently on Humira for Psoriasis (and Crohns?). Previously on Remicade. Patient reports this does not feel like a Crohns flare.  UTI Urine culture is positive for Danielle [name].  The patient has been treated with rocephin inpatient and will be discharged on keflex.  Normocytic anemia Likely secondary to chronic illness.  HGb appears to vacillate some what. No signs of frank bleeding or bruising. Likely stable for outpatient follow up.  Rash /  Lower Extremity ulcerations. The rash is likely related to antibiotics. Patient is followed by a dermatologist outpatient who recommended Nystatin cream. She was seen by the wound care nurse and treated with nystatin cream and oral Diflucan. Rash improving.  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 Appears stated age. Lying comfortably in bed HEENT: EOMI, Anicteic Sclera. Neck: Supple, no JVD, no masses  Cardiovascular: RRR, S1 S2 auscultated, no rubs, murmurs or gallops. No LEE Respiratory: Clear to auscultation bilaterally. No increased work of breathing Abdomen: Soft,  nontender, nondistended, + bowel sounds  Extremities: warm dry without cyanosis clubbing or edema.  Neuro: AAOx3, cranial nerves grossly intact. Able to move all 4 extremities Skin: annular lesions and ulcerations on lower extremities which are now scabbed over, reddened papules on face are becoming less erythematous.    Discharge Instructions   Discharge Instructions    Diet - low sodium heart healthy    Complete by:  As directed      Increase activity slowly    Complete by:  As directed           Current Discharge Medication List    START taking these medications   Details  cephALEXin (KEFLEX) 500 MG capsule Take 1 capsule (500 mg total) by mouth every 12 (twelve) hours. Qty: 4 capsule, Refills: 0    feeding supplement, RESOURCE BREEZE, (RESOURCE BREEZE) LIQD Take 1 Container by mouth 2 (two) times daily between meals. Qty: 30 Container, Refills: 0    fluconazole (DIFLUCAN) 100 MG tablet Take 1 tablet (100 mg total) by mouth daily. Qty: 5 tablet, Refills: 0    nystatin (MYCOSTATIN/NYSTOP) 100000 UNIT/GM POWD Apply three times a day affected area. Qty: 1 Bottle, Refills: 0    sodium bicarbonate 650 MG tablet Take 1 tablet (650 mg total) by mouth 2 (two) times daily. Qty: 15 tablet, Refills: 0      CONTINUE these medications which have CHANGED   Details  nystatin cream (MYCOSTATIN) Apply topically 2  (two) times daily. Qty: 30 g, Refills: 0    potassium chloride SA (KLOR-CON M20) 20 MEQ tablet Take 1 tablet (20 mEq total) by mouth daily. Qty: 20 tablet, Refills: 1      CONTINUE these medications which have NOT CHANGED   Details  albuterol (PROVENTIL HFA;VENTOLIN HFA) 108 (90 BASE) MCG/ACT inhaler Inhale 2 puffs into the lungs every 6 (six) hours as needed for wheezing. Qty: 1 Inhaler, Refills: 2    Calcium Carbonate-Vitamin D (CALCIUM + D PO) Take 1 tablet by mouth daily.    dicyclomine (BENTYL) 10 MG/5ML syrup TAKE 2 TEASPOONSFUL 3 TIMES A DAY BEFORE MEALS Qty: 300 mL, Refills: 5    diphenhydrAMINE (BENADRYL) 25 mg capsule Take 25 mg by mouth every 6 (six) hours as needed for itching.    diphenoxylate-atropine (LOMOTIL) 2.5-0.025 MG per tablet Take 1 tablet by mouth 4 (four) times daily as needed for diarrhea or loose stools. Qty: 120 tablet, Refills: 2   Associated Diagnoses: Diarrhea; Crohn's disease, unspecified complication    HUMIRA  PEN 40 MG/0.8ML PNKT Inject 40 mg into the skin every 14 (fourteen) days.     HYDROcodone-acetaminophen (NORCO) 7.5-325 MG per tablet Take 1 tablet by mouth every 6 (six) hours as needed for moderate pain.    Multiple Vitamin (MULTIVITAMIN WITH MINERALS) TABS Take 1 tablet by mouth daily.    ondansetron (ZOFRAN) 4 MG tablet TAKE 1 TABLET TWICE A DAY Qty: 20 tablet, Refills: 0   Associated Diagnoses: Nausea alone    pantoprazole (PROTONIX) 40 MG tablet Take 1 tablet (40 mg total) by mouth 2 (two) times daily before a meal. Qty: 180 tablet, Refills: 3   Associated Diagnoses: GERD (gastroesophageal reflux disease)    triamcinolone cream (KENALOG) 0.1 % Apply 1 application topically 2 (two) times daily.    zolpidem (AMBIEN) 10 MG tablet Take 10 mg by mouth at bedtime as needed for sleep.     Cyanocobalamin (VITAMIN B-12 IJ) Inject 1 each as directed every 30 (thirty) days. Around the 1st week of each month      STOP taking these  medications     spironolactone (ALDACTONE) 50 MG tablet        Allergies  Allergen Reactions  . Remicade [Infliximab] Rash    Per Patient - she has developed a rash. Being seen by Dr. Flisher/Dermatology @ [facility].   . Zithromax [Azithromycin] Swelling    Patient states that she has a swollen mouth/ blisters, yeast present mouth and vaginia.  . Aspirin Other (See Comments)    Cramps her stomach  . Ciprofloxacin     unknown  . Sulfa Antibiotics Nausea And Vomiting   Follow-up Information    Follow up with HILL,GERALD K, MD In 1 week.   Specialty:  Family Medicine   Contact information:   [address] [place] [address] [phone]        The results of significant diagnostics from this hospitalization (including imaging, microbiology, ancillary and laboratory) are listed below for reference.     Microbiology: Recent Results (from the past 240 hour(s))  Culture, Urine     Status: None   Collection Time: 11/18/14  6:33 PM  Result Value Ref Range Status   Specimen Description URINE, RANDOM  Final   Special Requests ADDED [id number]  Final   Culture  Setup Time   Final    11/20/2014 03:52 Performed at [facility]   Final    >=100,000 COLONIES/ML Performed at [facility]   Final    Danielle [name] Performed at [facility]    Report Status 11/22/2014 FINAL  Final   Organism ID, Bacteria Danielle [name]  Final      Susceptibility   Danielle [name] - MIC*    AMPICILLIN >=32 RESISTANT Resistant     CEFAZOLIN <=4 SENSITIVE Sensitive     CEFTRIAXONE <=1 SENSITIVE Sensitive     CIPROFLOXACIN 2 INTERMEDIATE Intermediate     GENTAMICIN <=1 SENSITIVE Sensitive     LEVOFLOXACIN >=8 RESISTANT Resistant     NITROFURANTOIN 128 RESISTANT Resistant     TOBRAMYCIN <=1 SENSITIVE Sensitive     TRIMETH/SULFA <=20 SENSITIVE Sensitive     PIP/TAZO 8 SENSITIVE Sensitive     * Danielle [name]   Clostridium Difficile by PCR     Status: None   Collection Time: 11/19/14  8:21 PM  Result Value Ref Range Status   C difficile by pcr NEGATIVE NEGATIVE Final  Labs: Basic Metabolic Panel:  Recent Labs Lab 11/18/14 1710  11/20/14 0850 11/20/14 2020 11/21/14 0550 11/21/14 1540 11/22/14 0500  NA 127*  < > 137 133* 137 131* 136*  K 2.7*  < > 3.6* 3.4* 3.3* 3.8 4.4  CL 96  < > 109 105 104 102 107  CO2 10*  < > 14* 15* 20 19 18*  GLUCOSE 77  < > 85 112* 78 94 82  BUN 18  < > 8 8 7 7 7   CREATININE 1.79*  < > 1.06 0.98 1.02 0.93 1.01  CALCIUM 8.9  < > 8.3* 8.1* 8.0* 7.8* 7.8*  MG 1.7  --   --   --   --   --   --   < > = values in this interval not displayed. Liver Function Tests:  Recent Labs Lab 11/18/14 1710  AST 23  ALT 16  ALKPHOS 133*  BILITOT 0.3  PROT 8.8*  ALBUMIN 2.7*    Recent Labs Lab 11/18/14 1710  LIPASE 76*    CBC:  Recent Labs Lab 11/18/14 1710 11/19/14 0635 11/20/14 0850 11/21/14 0550 11/22/14 0500  WBC 15.6* 11.5* 9.4 8.8 7.9  NEUTROABS 10.1*  --   --   --   --   HGB 12.7 10.4* 11.0* 9.2* 8.4*  HCT 37.4 32.2* 32.5* 28.3* 26.1*  MCV 78.2 79.5 78.1 77.5* 78.6  PLT 413* 332 349 317 249    BNP (last 3 results)  Recent Labs  03/20/14 1052 07/08/14 1454  PROBNP 255.5* 210.7*   CBG:  Recent Labs Lab 11/19/14 0826  GLUCAP 165*       Signed:  Melton Alar, PA-C  [facility] 11/22/2014, 1:22 PM    Patient seen and examined. Agree with Discharge summary done by Imogene Burn PA-C.  Patient feeling better, denies melena, hematochezia. No dizziness. Her BP usually runs low.  She feels great wishes to go home.

## 2014-11-22 NOTE — Evaluation (Signed)
Physical Therapy Evaluation Patient Details Name: Danielle [name] MRN: [id number] DOB: 02/25/1946 Today's Date: 11/22/2014   History of Present Illness  68 yo female arriving due to SOB, N?V?D x5 days. Pt with loss of weight ~ 20 pounds within 4 months.  UTI (+) rash / lower extremity ulcerations PMH: IBS, crohns disease GERD, hiatal hernia, anemia, PNA, GOUT, psoriasis, polyarthralgia, bowel resection, anal stenosis, goiter  Clinical Impression  Pt admitted with above diagnosis. Pt currently with functional limitations due to the deficits listed below (see PT Problem List). At the time of PT eval pt was able to perform transfers and ambulation with no physical assistance. Pt will benefit from skilled PT to increase their independence and safety with mobility to allow discharge to the venue listed below.    Unsure of level of care that pt requires at home. She originally states that she is independent with ADL's and can complete bathing and dressing without assistance. Pt later reports that she and her sister are looking to get a home health aide to assist pt with her bath and cooking at home. 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 RN notified of pt's request to speak with a case manager/CSW for more details.     Follow Up Recommendations Home health PT;Supervision for mobility/OOB    Equipment Recommendations  None recommended by PT    Recommendations for Other Services       Precautions / Restrictions Precautions Precautions: Fall Precaution Comments: Pt can be impulsive at times Restrictions Weight Bearing Restrictions: No      Mobility  Bed Mobility Overal bed mobility: Needs Assistance Bed Mobility: Supine to Sit     Supine to sit: Min guard     General bed mobility comments: Pt was able to transition to EOB with min guard for trunk elevation. Pt required  increased time to scoot to EOB so feet were resting on ground.   Transfers Overall transfer level: Needs assistance Equipment used: None Transfers: Sit to/from Stand Sit to Stand: Supervision         General transfer comment: Pt was able to power-up to full standing position without assistance. No unsteadiness noted.   Ambulation/Gait Ambulation/Gait assistance: Supervision Ambulation Distance (Feet): 25 Feet Assistive device: None Gait Pattern/deviations: Step-through pattern;Decreased stride length;Trendelenburg;Trunk flexed Gait velocity: Decreased Gait velocity interpretation: Below normal speed for age/gender General Gait Details: Pt refused ambulation out in the hallway, and appeared suddenly frustrated when PT asked pt to ambulate. Pt states "I can do whatever I want to do and I don't need help." Pt agreeable to ambulate in the room. Slightly unsteady but no physical assist required. Pt appeared to have difficulty fully extending her knees.   Stairs            Wheelchair Mobility    Modified Rankin (Stroke Patients Only)       Balance Overall balance assessment: No apparent balance deficits (not formally assessed)                                           Pertinent Vitals/Pain Pain Assessment: No/denies pain    Home Living Family/patient expects to be discharged to:: Private residence Living Arrangements: Other relatives (Sister) Available Help at Discharge: Family;Available PRN/intermittently Type of Home: House  Home Access: Level entry     Home Layout: One level Home Equipment: Cane - single point;Shower seat      Prior Function Level of Independence: Independent         Comments: Pt states she is independent with everything, and occasionally uses a cane (later states she hardly ever uses it and shouldn't have mentioned it). 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 Pt asking for resources to get an  aide at home to assist her with her bath (despite earlier stating she did not need assist) - looking to have her sister be hired to care for her.      Hand Dominance   Dominant Hand: Right    Extremity/Trunk Assessment   Upper Extremity Assessment: Defer to OT evaluation           Lower Extremity Assessment: Generalized weakness      Cervical / Trunk Assessment: Normal  Communication   Communication: No difficulties  Cognition Arousal/Alertness: Awake/alert Behavior During Therapy: WFL for tasks assessed/performed Overall Cognitive Status: Within Functional Limits for tasks assessed                      General Comments      Exercises        Assessment/Plan    PT Assessment Patient needs continued PT services  PT Diagnosis Abnormality of gait;Generalized weakness   PT Problem List Decreased strength;Decreased range of motion;Decreased activity tolerance;Decreased balance;Decreased mobility;Decreased knowledge of use of DME;Decreased safety awareness;Decreased knowledge of precautions  PT Treatment Interventions DME instruction;Gait training;Stair training;Functional mobility training;Therapeutic activities;Therapeutic exercise;Neuromuscular re-education;Patient/family education   PT Goals (Current goals can be found in the Care Plan section) Acute Rehab PT Goals Patient Stated Goal: Return home with sister today PT Goal Formulation: With patient Time For Goal Achievement: 11/29/14 Potential to Achieve Goals: Good    Frequency Min 3X/week   Barriers to discharge        Co-evaluation               End of Session Equipment Utilized During Treatment:  (Pt refused gait belt) Activity Tolerance: Patient tolerated treatment well Patient left: in chair;with call bell/phone within reach Nurse Communication: Mobility status         Time: 6295-2841 PT Time Calculation (min) (ACUTE ONLY): 27 min   Charges:   PT Evaluation $Initial PT  Evaluation Tier I: 1 Procedure PT Treatments $Gait Training: 8-22 mins $Therapeutic Activity: 8-22 mins   PT G Codes:          Rolinda Roan 11/22/2014, 1:22 PM   Rolinda Roan, PT, DPT Acute Rehabilitation Services Pager: [phone]

## 2014-11-22 NOTE — Care Management Note (Signed)
    Page 1 of 1   11/22/2014     4:13:36 PM CARE MANAGEMENT NOTE 11/22/2014  Patient:  Danielle [name], Danielle [name]   Account Number:  [id number]  Date Initiated:  11/22/2014  Documentation initiated by:  Tomi Bamberger  Subjective/Objective Assessment:   dx gastroenteritis  admit     Action/Plan:   Anticipated DC Date:  11/22/2014   Anticipated DC Plan:  [place]  CM consult      Choice offered to / List presented to:             Status of service:  Completed, signed off Medicare Important Message given?  YES (If response is "NO", the following Medicare IM given date fields will be blank) Date Medicare IM given:  11/21/2014 Medicare IM given by:  Tomi Bamberger Date Additional Medicare IM given:   Additional Medicare IM given by:    Discharge Disposition:  HOME/SELF CARE  Per UR Regulation:  Reviewed for med. necessity/level of care/duration of stay  If discussed at [place] of Stay Meetings, dates discussed:    Comments:  11/22/14 [place], BSN [phone]  patient  is for dc today, and she states she does not want hh services but would like pcs services or caps.  NCM informed her to contact her pcp so they can send information to [place], she states she will contact her pcp.

## 2014-12-03 ENCOUNTER — Ambulatory Visit (INDEPENDENT_AMBULATORY_CARE_PROVIDER_SITE_OTHER): Payer: Medicare Other | Admitting: Internal Medicine

## 2014-12-03 ENCOUNTER — Telehealth (INDEPENDENT_AMBULATORY_CARE_PROVIDER_SITE_OTHER): Payer: Self-pay | Admitting: *Deleted

## 2014-12-03 ENCOUNTER — Other Ambulatory Visit (INDEPENDENT_AMBULATORY_CARE_PROVIDER_SITE_OTHER): Payer: Self-pay | Admitting: *Deleted

## 2014-12-03 DIAGNOSIS — R197 Diarrhea, unspecified: Secondary | ICD-10-CM

## 2014-12-03 DIAGNOSIS — K50919 Crohn's disease, unspecified, with unspecified complications: Secondary | ICD-10-CM

## 2014-12-03 MED ORDER — DIPHENOXYLATE-ATROPINE 2.5-0.025 MG PO TABS: 1.0000 | ORAL_TABLET | Freq: Four times a day (QID) | ORAL | Status: DC | PRN

## 2014-12-03 NOTE — Telephone Encounter (Signed)
Left message wants to talk to tammy -- didn't go into detail

## 2014-12-03 NOTE — Telephone Encounter (Signed)
Patient called and is requesting a refill on the Lomotil. Per Lelon Perla may fill with 2 refills.  This was called to patient's pharmacy. Patient was made aware.

## 2014-12-03 NOTE — Telephone Encounter (Signed)
Patient was requesting that her Lomotil be filled. Per Lelon Perla may fill with 2 refills. This was called to the patient's pharmacy and patient was called back and made aware.

## 2014-12-06 DIAGNOSIS — K5090 Crohn's disease, unspecified, without complications: Secondary | ICD-10-CM | POA: Diagnosis not present

## 2014-12-06 DIAGNOSIS — R21 Rash and other nonspecific skin eruption: Secondary | ICD-10-CM | POA: Diagnosis not present

## 2014-12-06 DIAGNOSIS — L308 Other specified dermatitis: Secondary | ICD-10-CM | POA: Diagnosis not present

## 2014-12-06 DIAGNOSIS — L408 Other psoriasis: Secondary | ICD-10-CM | POA: Diagnosis not present

## 2014-12-06 DIAGNOSIS — K50919 Crohn's disease, unspecified, with unspecified complications: Secondary | ICD-10-CM | POA: Diagnosis not present

## 2014-12-17 ENCOUNTER — Encounter (INDEPENDENT_AMBULATORY_CARE_PROVIDER_SITE_OTHER): Payer: Self-pay | Admitting: Internal Medicine

## 2014-12-17 ENCOUNTER — Ambulatory Visit (INDEPENDENT_AMBULATORY_CARE_PROVIDER_SITE_OTHER): Payer: Medicare Other | Admitting: Internal Medicine

## 2014-12-17 VITALS — BP 110/66 | HR 90 | Temp 97.9°F | Resp 18 | Ht 64.5 in | Wt 138.1 lb

## 2014-12-17 DIAGNOSIS — R21 Rash and other nonspecific skin eruption: Secondary | ICD-10-CM | POA: Diagnosis not present

## 2014-12-17 DIAGNOSIS — K50919 Crohn's disease, unspecified, with unspecified complications: Secondary | ICD-10-CM

## 2014-12-17 DIAGNOSIS — E876 Hypokalemia: Secondary | ICD-10-CM

## 2014-12-17 DIAGNOSIS — D6489 Other specified anemias: Secondary | ICD-10-CM | POA: Diagnosis not present

## 2014-12-17 DIAGNOSIS — K219 Gastro-esophageal reflux disease without esophagitis: Secondary | ICD-10-CM | POA: Diagnosis not present

## 2014-12-17 MED ORDER — NYSTATIN 100000 UNIT/GM EX POWD: CUTANEOUS | Status: DC

## 2014-12-17 NOTE — Progress Notes (Signed)
Presenting complaint;  Follow-up for Crohn's disease  GERD and anemia.  Subjective:  Patient is 69 year old African-American female who has Crohn's disease involving small bowel as well as perianal disease. She has required bowel resection on at least 2 or maybe 3 occasions as well as I&D of perianal abscess who presents for scheduled visit. She was last seen on 09/03/2014. She does not feel well. She is dealing with recurrent rash. She had skin biopsy last week by her physician at [facility] and she was told that she has psoriasis. Patient reports that her rash has gotten worse every time she is taking Humira. Last dose was on 11/13/2014. She is not on topical medications and rashes slowly going away. She was hospitalized at [facility] about 4 weeks ago for 3 days but nausea vomiting diarrhea and noted to have profound hypokalemia. She declined to have abdominopelvic CT. Since her symptoms rapidly improved no imaging was performed. She says her sister also had similar illness. Stool studies revealed abundant yeast and she was treated with Diflucan. She is having 3-4 stools per day. Most of her stools are mushy. She denies melena or rectal bleeding or abdominal pain. She has lost 16 pounds since her last visit. She complains of intermittent nausea but no vomiting and she rarely experiences heartburn or regurgitation. She is using a wedge pillow which was provided to her during one of her hospitalizations at [facility].   Current Medications: Outpatient Encounter Prescriptions as of 12/17/2014  Medication Sig  . albuterol (PROVENTIL HFA;VENTOLIN HFA) 108 (90 BASE) MCG/ACT inhaler Inhale 2 puffs into the lungs every 6 (six) hours as needed for wheezing.  . Calcium Carbonate-Vitamin D (CALCIUM + D PO) Take 1 tablet by mouth daily.  . Cyanocobalamin (VITAMIN B-12 IJ) Inject 1 each as directed every 30 (thirty) days. Around the 1st week of each month  . dicyclomine (BENTYL) 10 MG/5ML syrup TAKE 2 TEASPOONSFUL 3 TIMES A  DAY BEFORE MEALS  . diphenhydrAMINE (BENADRYL) 25 mg capsule Take 25 mg by mouth every 6 (six) hours as needed for itching.  . diphenoxylate-atropine (LOMOTIL) 2.5-0.025 MG per tablet Take 1 tablet by mouth 4 (four) times daily as needed for diarrhea or loose stools.  . fluocinonide ointment (LIDEX) 7.41 % Apply 1 application topically 2 (two) times daily. Apply to arms, legs, abdomen 2-3 times daily  . HYDROcodone-acetaminophen (NORCO) 7.5-325 MG per tablet Take 1 tablet by mouth every 6 (six) hours as needed for moderate pain.  . Multiple Vitamin (MULTIVITAMIN WITH MINERALS) TABS Take 1 tablet by mouth daily.  [name] nystatin (MYCOSTATIN/NYSTOP) 100000 UNIT/GM POWD Apply three times a day affected area.  . nystatin cream (MYCOSTATIN) Apply topically 2 (two) times daily.  . ondansetron (ZOFRAN) 4 MG tablet TAKE 1 TABLET TWICE A DAY  . pantoprazole (PROTONIX) 40 MG tablet Take 1 tablet (40 mg total) by mouth 2 (two) times daily before a meal.  . potassium chloride SA (KLOR-CON M20) 20 MEQ tablet Take 1 tablet (20 mEq total) by mouth daily.  [name] spironolactone (ALDACTONE) 100 MG tablet Take 100 mg by mouth daily.   [name] triamcinolone cream (KENALOG) 0.1 % Apply 1 application topically 2 (two) times daily.  [name] HUMIRA PEN 40 MG/0.8ML PNKT Inject 40 mg into the skin every 14 (fourteen) days.   . [DISCONTINUED] cephALEXin (KEFLEX) 500 MG capsule Take 1 capsule (500 mg total) by mouth every 12 (twelve) hours. (Patient not taking: Reported on 12/17/2014)  . [DISCONTINUED] feeding supplement, RESOURCE BREEZE, (RESOURCE BREEZE) LIQD Take  1 Container by mouth 2 (two) times daily between meals. (Patient not taking: Reported on 12/17/2014)  . [DISCONTINUED] fluconazole (DIFLUCAN) 100 MG tablet Take 1 tablet (100 mg total) by mouth daily. (Patient not taking: Reported on 12/17/2014)  . [DISCONTINUED] sodium bicarbonate 650 MG tablet Take 1 tablet (650 mg total) by mouth 2 (two) times daily. (Patient not taking: Reported on  12/17/2014)  . [DISCONTINUED] zolpidem (AMBIEN) 10 MG tablet Take 10 mg by mouth at bedtime as needed for sleep.      Objective: Blood pressure 110/66, pulse 90, temperature 97.9 F (36.6 C), temperature source Oral, resp. rate 18, height 5' 4.5" (1.638 m), weight 138 lb 1.6 oz (62.642 kg). Patient is alert and in no acute distress. She has patchy erythema or skin of face. Conjunctiva is pink. Sclera is nonicteric Oropharyngeal mucosa is normal. No neck masses or thyromegaly noted. Cardiac exam with regular rhythm normal S1 and S2. No murmur or gallop noted. Lungs are clear to auscultation. Abdomen is symmetrical. Bowel sounds are normal. On palpation abdomen is soft and nontender without organomegaly or masses.  Maculopapular rash noted involving both legs as well as back of left hand.  Labs/studies Results: Lab studies from 11/22/2014  WBC 7.9, H&H 8.4 and 26.1 and platelet count 249K  MCV was 78.6.   serum potassium was 4.4. BUN 7 and creatinine 1.01.  Assessment:  #1. Crohn's disease involving small bowel and anorectal region. She presently appears to be in remission. She has not taken Humira since 11/13/2014. #2. Maculopapular skin rash. Recent biopsy shows changes typical of psoriasis. Rash may be secondary to Humira. Humira however is also treatment option for psoriasis. Therefore will continue to hold this medication. #3. Anemia. #4. History of hypokalemia. #5. GERD. She has large hiatal hernia. Review of sequential imaging studies suggest that hernia has enlarged. Surgical treatment at some point would be reasonable when other issues have resolved.  Plan:  Patient will go to lab for CBC, metabolic 7 and CRP. New prescription given for Mycostatin powder 60 g with 1 refill. Stop Humira. Will confer with her dermatologist at [facility] when she is able to provide [place] with her name and phone number(I was unable to access records via [facility]). May consider initiating Remicade or  Entyvio for her Crohn's disease when rash has resolved. Office visit in 2 months.

## 2014-12-17 NOTE — Patient Instructions (Signed)
Physician will call with results of blood tests 

## 2014-12-18 DIAGNOSIS — L448 Other specified papulosquamous disorders: Secondary | ICD-10-CM | POA: Diagnosis not present

## 2014-12-18 DIAGNOSIS — K5090 Crohn's disease, unspecified, without complications: Secondary | ICD-10-CM | POA: Diagnosis not present

## 2014-12-18 DIAGNOSIS — D510 Vitamin B12 deficiency anemia due to intrinsic factor deficiency: Secondary | ICD-10-CM | POA: Diagnosis not present

## 2014-12-19 LAB — BASIC METABOLIC PANEL
BUN: 19 mg/dL (ref 6–23)
CALCIUM: 8.2 mg/dL — AB (ref 8.4–10.5)
CHLORIDE: 105 meq/L (ref 96–112)
CO2: 16 mEq/L — ABNORMAL LOW (ref 19–32)
Creat: 1.84 mg/dL — ABNORMAL HIGH (ref 0.50–1.10)
GLUCOSE: 74 mg/dL (ref 70–99)
Potassium: 4.3 mEq/L (ref 3.5–5.3)
Sodium: 135 mEq/L (ref 135–145)

## 2014-12-19 LAB — CBC
HCT: 35.1 % — ABNORMAL LOW (ref 36.0–46.0)
HEMOGLOBIN: 11.2 g/dL — AB (ref 12.0–15.0)
MCH: 26.9 pg (ref 26.0–34.0)
MCHC: 31.9 g/dL (ref 30.0–36.0)
MCV: 84.4 fL (ref 78.0–100.0)
MPV: 9.4 fL (ref 8.6–12.4)
Platelets: 548 10*3/uL — ABNORMAL HIGH (ref 150–400)
RBC: 4.16 MIL/uL (ref 3.87–5.11)
RDW: 16.6 % — AB (ref 11.5–15.5)
WBC: 12 10*3/uL — ABNORMAL HIGH (ref 4.0–10.5)

## 2014-12-19 LAB — C-REACTIVE PROTEIN: CRP: 7.2 mg/dL — AB (ref <0.60)

## 2014-12-20 ENCOUNTER — Other Ambulatory Visit (INDEPENDENT_AMBULATORY_CARE_PROVIDER_SITE_OTHER): Payer: Self-pay | Admitting: Internal Medicine

## 2014-12-24 ENCOUNTER — Other Ambulatory Visit (INDEPENDENT_AMBULATORY_CARE_PROVIDER_SITE_OTHER): Payer: Self-pay | Admitting: Internal Medicine

## 2014-12-24 DIAGNOSIS — K5090 Crohn's disease, unspecified, without complications: Secondary | ICD-10-CM

## 2014-12-26 DIAGNOSIS — M79674 Pain in right toe(s): Secondary | ICD-10-CM | POA: Diagnosis not present

## 2014-12-26 DIAGNOSIS — L03031 Cellulitis of right toe: Secondary | ICD-10-CM | POA: Diagnosis not present

## 2014-12-26 DIAGNOSIS — M79675 Pain in left toe(s): Secondary | ICD-10-CM | POA: Diagnosis not present

## 2014-12-27 ENCOUNTER — Other Ambulatory Visit (INDEPENDENT_AMBULATORY_CARE_PROVIDER_SITE_OTHER): Payer: Self-pay | Admitting: Internal Medicine

## 2014-12-27 ENCOUNTER — Telehealth (INDEPENDENT_AMBULATORY_CARE_PROVIDER_SITE_OTHER): Payer: Self-pay | Admitting: *Deleted

## 2014-12-27 NOTE — Telephone Encounter (Signed)
Per Dr.Rehman - may call in Zofran #30 with 1 refill. This was escribed to the pharamcy. Patient was called and made aware. Ask that she try and take 2 by mouth daily as needed for nausea and vomiting.

## 2014-12-27 NOTE — Telephone Encounter (Signed)
Danielle [name] would like to please speak with [name] about needing a refill on her Zofran. It was last refill on 12/21/14 by Deberah Castle, NP #20 with zero refills. [name] said she only has enough to last until Saturday. The return phone number is [phone].

## 2015-01-08 ENCOUNTER — Other Ambulatory Visit (INDEPENDENT_AMBULATORY_CARE_PROVIDER_SITE_OTHER): Payer: Self-pay | Admitting: Internal Medicine

## 2015-01-08 ENCOUNTER — Ambulatory Visit (HOSPITAL_COMMUNITY): Payer: Medicare Other

## 2015-01-08 ENCOUNTER — Ambulatory Visit (HOSPITAL_COMMUNITY)
Admission: RE | Admit: 2015-01-08 | Discharge: 2015-01-08 | Disposition: A | Discharge status: Home / Self Care | Payer: Medicare Other | Source: Ambulatory Visit | Attending: Internal Medicine | Admitting: Internal Medicine

## 2015-01-08 DIAGNOSIS — K603 Anal fistula: Secondary | ICD-10-CM | POA: Insufficient documentation

## 2015-01-08 DIAGNOSIS — K5090 Crohn's disease, unspecified, without complications: Secondary | ICD-10-CM | POA: Insufficient documentation

## 2015-01-09 DIAGNOSIS — D2372 Other benign neoplasm of skin of left lower limb, including hip: Secondary | ICD-10-CM | POA: Diagnosis not present

## 2015-01-11 ENCOUNTER — Other Ambulatory Visit (INDEPENDENT_AMBULATORY_CARE_PROVIDER_SITE_OTHER): Payer: Self-pay | Admitting: Internal Medicine

## 2015-01-11 MED ORDER — METRONIDAZOLE 250 MG PO TABS: 250.0000 mg | ORAL_TABLET | Freq: Three times a day (TID) | ORAL | Status: DC

## 2015-01-15 ENCOUNTER — Telehealth (INDEPENDENT_AMBULATORY_CARE_PROVIDER_SITE_OTHER): Payer: Self-pay | Admitting: *Deleted

## 2015-01-15 NOTE — Telephone Encounter (Signed)
Patient states that she was told by Dr.Rehman after talking with him recently, that she may take a Zofran in the morning and then take another when she feels sick. She says that her current Rx is for #30- she is running out because of the way she is taking them. She says that she is also taking with the Flagyl.  Patient is requesting instead of BID PRN , may it be written TID PRN or QID PRN. Per Dr.Rehman call in #30 TID PRN. Will address 01/16/15.

## 2015-01-15 NOTE — Telephone Encounter (Signed)
Danielle [name] has a question about a medication before it is refilled. Her return phone number is [phone].

## 2015-01-16 NOTE — Telephone Encounter (Signed)
Zofran 4 mg Tablets - Called to [facility] Per Dr.Rehman the patient may take 1 in the morning and another when she feels nauseated. Patient is to take BID/PRN for Nausea. #30  Patient was made aware.

## 2015-01-17 ENCOUNTER — Other Ambulatory Visit (INDEPENDENT_AMBULATORY_CARE_PROVIDER_SITE_OTHER): Payer: Self-pay | Admitting: *Deleted

## 2015-01-17 ENCOUNTER — Telehealth (INDEPENDENT_AMBULATORY_CARE_PROVIDER_SITE_OTHER): Payer: Self-pay | Admitting: Internal Medicine

## 2015-01-17 DIAGNOSIS — K219 Gastro-esophageal reflux disease without esophagitis: Secondary | ICD-10-CM

## 2015-01-17 MED ORDER — SPIRONOLACTONE 50 MG PO TABS: 50.0000 mg | ORAL_TABLET | Freq: Every day | ORAL | Status: DC

## 2015-01-17 MED ORDER — PANTOPRAZOLE SODIUM 40 MG PO TBEC: 40.0000 mg | DELAYED_RELEASE_TABLET | Freq: Two times a day (BID) | ORAL | Status: DC

## 2015-01-17 NOTE — Telephone Encounter (Signed)
Rx sent 

## 2015-01-17 NOTE — Telephone Encounter (Signed)
Per Dr.Rehman may refill .

## 2015-01-20 MED ORDER — PROMETHAZINE HCL 12.5 MG PO TABS: ORAL_TABLET | ORAL | Status: DC

## 2015-01-20 NOTE — Telephone Encounter (Signed)
PT CALLED. ZOFRAN NOT HELPING. REQUESTING PHENERGAN RX SENT. REFILLS PER NUR/TS.

## 2015-01-22 NOTE — Telephone Encounter (Signed)
Unable to reach Dr. Clovis Riley at [facility]. Message left for patient.

## 2015-01-24 ENCOUNTER — Telehealth (INDEPENDENT_AMBULATORY_CARE_PROVIDER_SITE_OTHER): Payer: Self-pay | Admitting: *Deleted

## 2015-01-24 NOTE — Telephone Encounter (Signed)
On 01/23/15 per Dr.Rehman after speaking with the patient's allergist , it is okay for the patient to go back on Remicade. She will be introduced to Remicade as follows per Dr.Rehman 0-2 weeks- 6 weeks then every 8 weeks for maintenance. 5mg  /kg. The patient will need to take the Methotrexate 7.5 mg by mouth weekly. Dr.Rehman ask that she go ahead and start this medication. Patient was called and advised , and states that she was going to have the Pharmacy go ahead a fill that prescription for 01/24/15.  A call to [employer] where a PA was completed over the phone. It was approved through Jun 11 2015. [employer] ID [id number]. Order to be sent to [facility] today 01/24/15.

## 2015-01-25 ENCOUNTER — Telehealth (HOSPITAL_COMMUNITY): Payer: Self-pay | Admitting: *Deleted

## 2015-01-25 NOTE — Telephone Encounter (Signed)
Danielle [name] contacted by B. Mishue. Pt wants to start her Remicade infusion next week and will call back to make appt and states " I have your number to call".

## 2015-01-26 ENCOUNTER — Inpatient Hospital Stay (HOSPITAL_COMMUNITY)
Admission: EM | Admit: 2015-01-26 | Discharge: 2015-02-03 | DRG: 982 | Disposition: A | Discharge status: Home / Self Care | Payer: Medicare Other | Attending: Internal Medicine | Admitting: Internal Medicine

## 2015-01-26 ENCOUNTER — Emergency Department (HOSPITAL_COMMUNITY): Payer: Medicare Other

## 2015-01-26 ENCOUNTER — Encounter (HOSPITAL_COMMUNITY): Payer: Self-pay

## 2015-01-26 DIAGNOSIS — R197 Diarrhea, unspecified: Secondary | ICD-10-CM | POA: Diagnosis not present

## 2015-01-26 DIAGNOSIS — E871 Hypo-osmolality and hyponatremia: Secondary | ICD-10-CM | POA: Diagnosis present

## 2015-01-26 DIAGNOSIS — N390 Urinary tract infection, site not specified: Principal | ICD-10-CM | POA: Diagnosis present

## 2015-01-26 DIAGNOSIS — Z87891 Personal history of nicotine dependence: Secondary | ICD-10-CM | POA: Diagnosis not present

## 2015-01-26 DIAGNOSIS — N179 Acute kidney failure, unspecified: Secondary | ICD-10-CM | POA: Diagnosis present

## 2015-01-26 DIAGNOSIS — E876 Hypokalemia: Secondary | ICD-10-CM | POA: Diagnosis not present

## 2015-01-26 DIAGNOSIS — K219 Gastro-esophageal reflux disease without esophagitis: Secondary | ICD-10-CM | POA: Diagnosis present

## 2015-01-26 DIAGNOSIS — K50913 Crohn's disease, unspecified, with fistula: Secondary | ICD-10-CM | POA: Diagnosis present

## 2015-01-26 DIAGNOSIS — N898 Other specified noninflammatory disorders of vagina: Secondary | ICD-10-CM | POA: Diagnosis present

## 2015-01-26 DIAGNOSIS — K50919 Crohn's disease, unspecified, with unspecified complications: Secondary | ICD-10-CM | POA: Diagnosis not present

## 2015-01-26 DIAGNOSIS — R21 Rash and other nonspecific skin eruption: Secondary | ICD-10-CM | POA: Diagnosis present

## 2015-01-26 DIAGNOSIS — N823 Fistula of vagina to large intestine: Secondary | ICD-10-CM | POA: Diagnosis present

## 2015-01-26 DIAGNOSIS — N828 Other female genital tract fistulae: Secondary | ICD-10-CM | POA: Diagnosis present

## 2015-01-26 DIAGNOSIS — N824 Other female intestinal-genital tract fistulae: Secondary | ICD-10-CM | POA: Diagnosis not present

## 2015-01-26 DIAGNOSIS — R112 Nausea with vomiting, unspecified: Secondary | ICD-10-CM | POA: Diagnosis not present

## 2015-01-26 DIAGNOSIS — K449 Diaphragmatic hernia without obstruction or gangrene: Secondary | ICD-10-CM | POA: Diagnosis present

## 2015-01-26 DIAGNOSIS — I959 Hypotension, unspecified: Secondary | ICD-10-CM | POA: Diagnosis present

## 2015-01-26 DIAGNOSIS — D649 Anemia, unspecified: Secondary | ICD-10-CM | POA: Diagnosis not present

## 2015-01-26 DIAGNOSIS — E860 Dehydration: Secondary | ICD-10-CM | POA: Diagnosis present

## 2015-01-26 DIAGNOSIS — L409 Psoriasis, unspecified: Secondary | ICD-10-CM | POA: Diagnosis present

## 2015-01-26 DIAGNOSIS — E872 Acidosis, unspecified: Secondary | ICD-10-CM

## 2015-01-26 DIAGNOSIS — R1110 Vomiting, unspecified: Secondary | ICD-10-CM | POA: Diagnosis not present

## 2015-01-26 DIAGNOSIS — K6289 Other specified diseases of anus and rectum: Secondary | ICD-10-CM | POA: Diagnosis present

## 2015-01-26 DIAGNOSIS — R109 Unspecified abdominal pain: Secondary | ICD-10-CM | POA: Diagnosis not present

## 2015-01-26 DIAGNOSIS — K50113 Crohn's disease of large intestine with fistula: Secondary | ICD-10-CM | POA: Diagnosis present

## 2015-01-26 DIAGNOSIS — K603 Anal fistula, unspecified: Secondary | ICD-10-CM | POA: Diagnosis present

## 2015-01-26 LAB — URINALYSIS, ROUTINE W REFLEX MICROSCOPIC
Bilirubin Urine: NEGATIVE
Glucose, UA: NEGATIVE mg/dL
Ketones, ur: NEGATIVE mg/dL
Nitrite: NEGATIVE
Protein, ur: 30 mg/dL — AB
Specific Gravity, Urine: 1.005 (ref 1.005–1.030)
Urobilinogen, UA: 0.2 mg/dL (ref 0.0–1.0)
pH: 6.5 (ref 5.0–8.0)

## 2015-01-26 LAB — CBC WITH DIFFERENTIAL/PLATELET
Basophils Absolute: 0 10*3/uL (ref 0.0–0.1)
Basophils Relative: 0 % (ref 0–1)
Eosinophils Absolute: 0.1 10*3/uL (ref 0.0–0.7)
Eosinophils Relative: 1 % (ref 0–5)
HCT: 36.7 % (ref 36.0–46.0)
Hemoglobin: 12.4 g/dL (ref 12.0–15.0)
Lymphocytes Relative: 32 % (ref 12–46)
Lymphs Abs: 4.1 10*3/uL — ABNORMAL HIGH (ref 0.7–4.0)
MCH: 27.3 pg (ref 26.0–34.0)
MCHC: 33.8 g/dL (ref 30.0–36.0)
MCV: 80.7 fL (ref 78.0–100.0)
Monocytes Absolute: 0.6 10*3/uL (ref 0.1–1.0)
Monocytes Relative: 5 % (ref 3–12)
Neutro Abs: 8.2 10*3/uL — ABNORMAL HIGH (ref 1.7–7.7)
Neutrophils Relative %: 62 % (ref 43–77)
Platelets: 460 10*3/uL — ABNORMAL HIGH (ref 150–400)
RBC: 4.55 MIL/uL (ref 3.87–5.11)
RDW: 17 % — ABNORMAL HIGH (ref 11.5–15.5)
WBC: 13 10*3/uL — ABNORMAL HIGH (ref 4.0–10.5)

## 2015-01-26 LAB — LIPASE, BLOOD: Lipase: 26 U/L (ref 11–59)

## 2015-01-26 LAB — COMPREHENSIVE METABOLIC PANEL
ALT: 17 U/L (ref 0–35)
AST: 31 U/L (ref 0–37)
Albumin: 2.5 g/dL — ABNORMAL LOW (ref 3.5–5.2)
Alkaline Phosphatase: 134 U/L — ABNORMAL HIGH (ref 39–117)
Anion gap: 13 (ref 5–15)
BUN: 21 mg/dL (ref 6–23)
CO2: 10 mmol/L — CL (ref 19–32)
Calcium: 7.4 mg/dL — ABNORMAL LOW (ref 8.4–10.5)
Chloride: 110 mmol/L (ref 96–112)
Creatinine, Ser: 2.44 mg/dL — ABNORMAL HIGH (ref 0.50–1.10)
GFR calc Af Amer: 22 mL/min — ABNORMAL LOW (ref >90)
GFR calc non Af Amer: 19 mL/min — ABNORMAL LOW (ref >90)
Glucose, Bld: 180 mg/dL — ABNORMAL HIGH (ref 70–99)
Potassium: 2.1 mmol/L — CL (ref 3.5–5.1)
Sodium: 133 mmol/L — ABNORMAL LOW (ref 135–145)
Total Bilirubin: 0.6 mg/dL (ref 0.3–1.2)
Total Protein: 8 g/dL (ref 6.0–8.3)

## 2015-01-26 LAB — MAGNESIUM: Magnesium: 1.4 mg/dL — ABNORMAL LOW (ref 1.5–2.5)

## 2015-01-26 LAB — PROTIME-INR
INR: 1.21 (ref 0.00–1.49)
Prothrombin Time: 15.5 seconds — ABNORMAL HIGH (ref 11.6–15.2)

## 2015-01-26 LAB — URINE MICROSCOPIC-ADD ON

## 2015-01-26 LAB — TSH: TSH: 3.286 u[IU]/mL (ref 0.350–4.500)

## 2015-01-26 LAB — I-STAT CG4 LACTIC ACID, ED
Lactic Acid, Venous: 1.47 mmol/L (ref 0.5–2.0)
Lactic Acid, Venous: 1.42 mmol/L (ref 0.5–2.0)

## 2015-01-26 MED ORDER — PROMETHAZINE HCL 25 MG PO TABS
12.5000 mg | ORAL_TABLET | Freq: Four times a day (QID) | ORAL | Status: DC | PRN
  Administered 2015-01-26 – 2015-02-02 (×11): 12.5 mg via ORAL
  Filled 2015-01-26 (×12): qty 1

## 2015-01-26 MED ORDER — DEXTROSE 5 % IV SOLN
1.0000 g | INTRAVENOUS | Status: DC
  Filled 2015-01-26: qty 10

## 2015-01-26 MED ORDER — SODIUM CHLORIDE 0.9 % IV BOLUS (SEPSIS)
500.0000 mL | Freq: Once | INTRAVENOUS | Status: AC
Start: 2015-01-26 — End: 2015-01-26
  Administered 2015-01-26: 500 mL via INTRAVENOUS

## 2015-01-26 MED ORDER — ONDANSETRON HCL 4 MG/2ML IJ SOLN
4.0000 mg | Freq: Once | INTRAMUSCULAR | Status: AC
Start: 2015-01-26 — End: 2015-01-26
  Administered 2015-01-26: 4 mg via INTRAVENOUS
  Filled 2015-01-26: qty 2

## 2015-01-26 MED ORDER — SODIUM CHLORIDE 0.9 % IV SOLN
INTRAVENOUS | Status: DC
  Administered 2015-01-26: 1000 mL via INTRAVENOUS

## 2015-01-26 MED ORDER — MAGNESIUM SULFATE 2 GM/50ML IV SOLN
2.0000 g | Freq: Once | INTRAVENOUS | Status: AC
  Administered 2015-01-26: 2 g via INTRAVENOUS
  Filled 2015-01-26 (×2): qty 50

## 2015-01-26 MED ORDER — SODIUM CHLORIDE 0.9 % IJ SOLN
3.0000 mL | Freq: Two times a day (BID) | INTRAMUSCULAR | Status: DC
  Administered 2015-01-26 – 2015-02-02 (×13): 3 mL via INTRAVENOUS

## 2015-01-26 MED ORDER — ONDANSETRON HCL 4 MG/2ML IJ SOLN
4.0000 mg | Freq: Once | INTRAMUSCULAR | Status: AC
  Administered 2015-01-26: 4 mg via INTRAVENOUS
  Filled 2015-01-26: qty 2

## 2015-01-26 MED ORDER — FENTANYL CITRATE 0.05 MG/ML IJ SOLN
50.0000 ug | Freq: Once | INTRAMUSCULAR | Status: AC
  Administered 2015-01-26: 50 ug via INTRAVENOUS
  Filled 2015-01-26: qty 2

## 2015-01-26 MED ORDER — HYDROCODONE-ACETAMINOPHEN 5-325 MG PO TABS
1.0000 | ORAL_TABLET | ORAL | Status: DC | PRN
  Administered 2015-01-29 – 2015-02-03 (×3): 2 via ORAL
  Filled 2015-01-26 (×4): qty 2

## 2015-01-26 MED ORDER — POTASSIUM CHLORIDE CRYS ER 20 MEQ PO TBCR
60.0000 meq | EXTENDED_RELEASE_TABLET | Freq: Once | ORAL | Status: DC
  Filled 2015-01-26: qty 3

## 2015-01-26 MED ORDER — HEPARIN SODIUM (PORCINE) 5000 UNIT/ML IJ SOLN
5000.0000 [IU] | Freq: Three times a day (TID) | INTRAMUSCULAR | Status: DC
  Administered 2015-01-26 – 2015-02-03 (×21): 5000 [IU] via SUBCUTANEOUS
  Filled 2015-01-26 (×26): qty 1

## 2015-01-26 MED ORDER — MORPHINE SULFATE 2 MG/ML IJ SOLN
1.0000 mg | INTRAMUSCULAR | Status: DC | PRN
  Administered 2015-01-27 – 2015-02-03 (×15): 1 mg via INTRAVENOUS
  Filled 2015-01-26 (×17): qty 1

## 2015-01-26 MED ORDER — PANTOPRAZOLE SODIUM 40 MG PO TBEC: 40.0000 mg | DELAYED_RELEASE_TABLET | Freq: Two times a day (BID) | ORAL | Status: DC

## 2015-01-26 MED ORDER — POTASSIUM CHLORIDE 10 MEQ/100ML IV SOLN
10.0000 meq | Freq: Once | INTRAVENOUS | Status: AC
  Administered 2015-01-26: 10 meq via INTRAVENOUS
  Filled 2015-01-26: qty 100

## 2015-01-26 MED ORDER — MORPHINE SULFATE 4 MG/ML IJ SOLN
4.0000 mg | Freq: Once | INTRAMUSCULAR | Status: DC
  Filled 2015-01-26: qty 1

## 2015-01-26 MED ORDER — POTASSIUM CHLORIDE 10 MEQ/100ML IV SOLN
10.0000 meq | INTRAVENOUS | Status: DC
  Administered 2015-01-26 (×3): 10 meq via INTRAVENOUS
  Filled 2015-01-26 (×4): qty 100

## 2015-01-26 MED ORDER — PANTOPRAZOLE SODIUM 40 MG PO TBEC
40.0000 mg | DELAYED_RELEASE_TABLET | Freq: Two times a day (BID) | ORAL | Status: DC
  Administered 2015-01-26 – 2015-02-03 (×16): 40 mg via ORAL
  Filled 2015-01-26 (×16): qty 1

## 2015-01-26 MED ORDER — CEFTRIAXONE SODIUM 1 G IJ SOLR
1.0000 g | INTRAMUSCULAR | Status: DC
  Administered 2015-01-27 – 2015-02-02 (×7): 1 g via INTRAVENOUS
  Filled 2015-01-26 (×8): qty 10

## 2015-01-26 MED ORDER — ACETAMINOPHEN 650 MG RE SUPP: 650.0000 mg | Freq: Four times a day (QID) | RECTAL | Status: DC | PRN

## 2015-01-26 MED ORDER — SODIUM CHLORIDE 0.9 % IV BOLUS (SEPSIS)
250.0000 mL | Freq: Once | INTRAVENOUS | Status: AC
  Administered 2015-01-26: 250 mL via INTRAVENOUS

## 2015-01-26 MED ORDER — DEXTROSE 5 % IV SOLN
1.0000 g | Freq: Once | INTRAVENOUS | Status: AC
  Administered 2015-01-26: 1 g via INTRAVENOUS
  Filled 2015-01-26: qty 10

## 2015-01-26 MED ORDER — KCL IN DEXTROSE-NACL 40-5-0.45 MEQ/L-%-% IV SOLN
INTRAVENOUS | Status: DC
  Administered 2015-01-26: 20:00:00 via INTRAVENOUS
  Filled 2015-01-26 (×2): qty 1000

## 2015-01-26 MED ORDER — PREDNISONE 50 MG PO TABS
60.0000 mg | ORAL_TABLET | Freq: Every day | ORAL | Status: DC
  Administered 2015-01-26 – 2015-02-03 (×9): 60 mg via ORAL
  Filled 2015-01-26 (×14): qty 1

## 2015-01-26 MED ORDER — MAGNESIUM SULFATE 2 GM/50ML IV SOLN
2.0000 g | Freq: Once | INTRAVENOUS | Status: AC
  Administered 2015-01-26: 2 g via INTRAVENOUS
  Filled 2015-01-26: qty 50

## 2015-01-26 MED ORDER — ACETAMINOPHEN 325 MG PO TABS
650.0000 mg | ORAL_TABLET | Freq: Four times a day (QID) | ORAL | Status: DC | PRN
  Administered 2015-01-26: 650 mg via ORAL
  Filled 2015-01-26 (×2): qty 2

## 2015-01-26 MED ORDER — SODIUM CHLORIDE 0.9 % IV BOLUS (SEPSIS)
1000.0000 mL | Freq: Once | INTRAVENOUS | Status: AC
  Administered 2015-01-26: 1000 mL via INTRAVENOUS

## 2015-01-26 NOTE — H&P (Signed)
[facility] History and Physical  Danielle [name] MWN:[id number] DOB: 24-Jul-1946 DOA: 01/26/2015  Referring physician: Wilson Singer PCP: Maggie Font, MD   Chief Complaint: Nausea and vomiting  HPI: Danielle [name] is a 69 y.o. female with past medical history of Crohn's disease, chronic diarrhea and hiatal hernia with GERD came into the hospital complaining about nausea, vomiting and diarrhea. Symptoms started about 2 weeks ago but progressed over the past several days, patient developed generalized weakness and overall feeling unwell so she decided to come to the hospital for further evaluation. Patient reported she vomits about 3-4 times a day, usually after eating. Her diarrhea slowed down but she still vomiting and feeling nauseous. In the ED she was found to be severely hypokalemic with potassium of 2.1, severely acidotic with bicarbonate of 10 , patient does not seem to have any kind of respiratory distress with this, appears to be acute on chronic metabolic acidosis. Patient also has acute renal failure with creatinine of 2.44. Patient admitted to the hospital for further evaluation.  Review of Systems:  Constitutional: generalized weakness Eyes: negative for irritation, redness and visual disturbance Ears, nose, mouth, throat, and face: negative for earaches, epistaxis, nasal congestion and sore throat Respiratory: negative for cough, dyspnea on exertion, sputum and wheezing Cardiovascular: negative for chest pain, dyspnea, lower extremity edema, orthopnea, palpitations and syncope Gastrointestinal:  per history of present illness  Genitourinary:negative for dysuria, frequency and hematuria Hematologic/lymphatic: negative for bleeding, easy bruising and lymphadenopathy Musculoskeletal:negative for arthralgias, muscle weakness and stiff joints Neurological: negative for coordination problems, gait problems, headaches and weakness Endocrine: negative for diabetic symptoms including  polydipsia, polyuria and weight loss Allergic/Immunologic: negative for anaphylaxis, hay fever and urticaria  Past Medical History  Diagnosis Date  . Crohn's disease   . Chronic diarrhea   . GERD (gastroesophageal reflux disease)   . Polyarthralgia   . Hiatal hernia   . Hemorrhoids   . Anemia   . Hiatal hernia     hiatal  . Pneumonia 2014  . Gout   . Psoriasis    Past Surgical History  Procedure Laterality Date  . Bowel resection  2009  . 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 Anal stenosis  ~ 2010  . Colonoscopy  2013  . Cholecystectomy  1990's  . Appendectomy  1980's  . Goiter  1999  . Esophagogastroduodenoscopy  11/11/2011    Procedure: ESOPHAGOGASTRODUODENOSCOPY (EGD);  Surgeon: Missy Sabins, MD;  Location: [facility] ENDOSCOPY;  Service: Endoscopy;  Laterality: N/A;  . Flexible sigmoidoscopy  11/11/2011    Procedure: FLEXIBLE SIGMOIDOSCOPY;  Surgeon: Missy Sabins, MD;  Location: [place];  Service: Endoscopy;  Laterality: N/A;  . Esophagogastroduodenoscopy  05/18/2012    Procedure: ESOPHAGOGASTRODUODENOSCOPY (EGD);  Surgeon: Rogene Houston, MD;  Location: [facility] ENDO SUITE;  Service: Endoscopy;  Laterality: N/A;  200  . Colostomy takedown  2009    "only had it for about 1 month" (08/22/2013)  . Left heart catheterization with coronary angiogram N/A 07/09/2014    Procedure: LEFT HEART CATHETERIZATION WITH CORONARY ANGIOGRAM;  Surgeon: Peter M Martinique, MD;  Location: [facility] CATH LAB;  Service: Cardiovascular;  Laterality: N/A;   Social History:   reports that she quit smoking about 18 years ago. Her smoking use included Cigarettes. She has a .48 pack-year smoking history. She has never used smokeless tobacco. She reports that she drinks about 1.2 oz of alcohol per week. She reports that she does not use illicit drugs.  Allergies  Allergen Reactions  . Remicade [Infliximab] Rash    Per Patient - she has developed a rash. Being seen by Dr. Flisher/Dermatology @ [facility].   . Zithromax  [Azithromycin] Swelling    Patient states that she has a swollen mouth/ blisters, yeast present mouth and vaginia.  . Aspirin Other (See Comments)    Cramps her stomach  . Ciprofloxacin     unknown  . Sulfa Antibiotics Nausea And Vomiting    Family History  Problem Relation Age of Onset  . Diabetes Mother   . Healthy Sister   . Hypertension Brother   . Colon cancer Brother     died with colon cancer  . Heart disease Father     Prior to Admission medications   Medication Sig Start Date End Date Taking? Authorizing Provider  albuterol (PROVENTIL HFA;VENTOLIN HFA) 108 (90 BASE) MCG/ACT inhaler Inhale 2 puffs into the lungs every 6 (six) hours as needed for wheezing. 02/27/13  Yes Nishant Dhungel, MD  Calcium Carbonate-Vitamin D (CALCIUM + D PO) Take 1 tablet by mouth daily.   Yes Historical Provider, MD  Cyanocobalamin (VITAMIN B-12 IJ) Inject 1 each as directed every 30 (thirty) days. Around the 1st week of each month   Yes Historical Provider, MD  dicyclomine (BENTYL) 10 MG/5ML syrup TAKE 2 TEASPOONSFUL 3 TIMES A DAY BEFORE MEALS 10/09/14  Yes Butch Penny, NP  diphenhydrAMINE (BENADRYL) 25 mg capsule Take 25 mg by mouth every 6 (six) hours as needed for itching. 03/22/14  Yes Adeline Saralyn Pilar, MD  diphenoxylate-atropine (LOMOTIL) 2.5-0.025 MG per tablet Take 1 tablet by mouth 4 (four) times daily as needed for diarrhea or loose stools. 12/03/14  Yes Butch Penny, NP  fluocinonide ointment (LIDEX) 3.38 % Apply 1 application topically 2 (two) times daily. Apply to arms, legs, abdomen 2-3 times daily 12/13/14  Yes Historical Provider, MD  HYDROcodone-acetaminophen (NORCO) 7.5-325 MG per tablet Take 1 tablet by mouth every 6 (six) hours as needed for moderate pain.   Yes Historical Provider, MD  Multiple Vitamin (MULTIVITAMIN WITH MINERALS) TABS Take 1 tablet by mouth daily.   Yes Historical Provider, MD  nystatin (MYCOSTATIN/NYSTOP) 100000 UNIT/GM POWD Apply three times a day affected area.  12/17/14  Yes Rogene Houston, MD  nystatin cream (MYCOSTATIN) Apply topically 2 (two) times daily. 11/22/14  Yes Belkys A Regalado, MD  ondansetron (ZOFRAN) 4 MG tablet TAKE 1 TABLET (4 MG TOTAL) BY MOUTH 2 (TWO) TIMES DAILY AS NEEDED FOR NAUSEA OR VOMITING. 12/27/14  Yes Rogene Houston, MD  pantoprazole (PROTONIX) 40 MG tablet Take 1 tablet (40 mg total) by mouth 2 (two) times daily before a meal. 01/17/15  Yes Butch Penny, NP  potassium chloride SA (KLOR-CON M20) 20 MEQ tablet Take 1 tablet (20 mEq total) by mouth daily. 11/22/14  Yes Belkys A Regalado, MD  promethazine (PHENERGAN) 12.5 MG tablet 1-2 po q4-6 h prn nausea or vomiting 01/20/15  Yes Danie Binder, MD  spironolactone (ALDACTONE) 100 MG tablet Take 100 mg by mouth daily.    Yes Historical Provider, MD  spironolactone (ALDACTONE) 50 MG tablet Take 1 tablet (50 mg total) by mouth daily. 01/17/15  Yes Rogene Houston, MD  triamcinolone cream (KENALOG) 0.1 % Apply 1 application topically 2 (two) times daily. 06/28/14  Yes Rogene Houston, MD  metroNIDAZOLE (FLAGYL) 250 MG tablet Take 1 tablet (250 mg total) by mouth 3 (three) times daily. 01/11/15   Rogene Houston, MD  Physical Exam: Filed Vitals:   01/26/15 1516  BP: 84/54  Pulse: 91  Temp:   Resp: 18   Constitutional: Oriented to person, place, and time. Well-developed and well-nourished. Cooperative.  Head: Normocephalic and atraumatic.  Nose: Nose normal.  Mouth/Throat: Uvula is midline, oropharynx is clear and moist and mucous membranes are normal.  Eyes: Conjunctivae and EOM are normal. Pupils are equal, round, and reactive to light.  Neck: Trachea normal and normal range of motion. Neck supple.  Cardiovascular: Normal rate, regular rhythm, S1 normal, S2 normal, normal heart sounds and intact distal pulses.   Pulmonary/Chest: Effort normal and breath sounds normal.  Abdominal: Soft. Bowel sounds are normal. There is no hepatosplenomegaly. There is no tenderness.    Musculoskeletal: Normal range of motion.  Neurological: Alert and oriented to person, place, and time. Has normal strength. No cranial nerve deficit or sensory deficit.  Skin: Skin is warm, dry and intact.  Psychiatric: Has a normal mood and affect. Speech is normal and behavior is normal.   Labs on Admission:  Basic Metabolic Panel:  Recent Labs Lab 01/26/15 1118  NA 133*  K 2.1*  CL 110  CO2 10*  GLUCOSE 180*  BUN 21  CREATININE 2.44*  CALCIUM 7.4*   Liver Function Tests:  Recent Labs Lab 01/26/15 1118  AST 31  ALT 17  ALKPHOS 134*  BILITOT 0.6  PROT 8.0  ALBUMIN 2.5*    Recent Labs Lab 01/26/15 1118  LIPASE 26   No results for input(s): AMMONIA in the last 168 hours. CBC:  Recent Labs Lab 01/26/15 1118  WBC 13.0*  NEUTROABS 8.2*  HGB 12.4  HCT 36.7  MCV 80.7  PLT 460*   Cardiac Enzymes: No results for input(s): CKTOTAL, CKMB, CKMBINDEX, TROPONINI in the last 168 hours.  BNP (last 3 results) No results for input(s): BNP in the last 8760 hours.  ProBNP (last 3 results)  Recent Labs  03/20/14 1052 07/08/14 1454  PROBNP 255.5* 210.7*    CBG: No results for input(s): GLUCAP in the last 168 hours.  Radiological Exams on Admission: Dg Abd 2 Views  01/26/2015   CLINICAL DATA:  Initial evaluation for vomiting and upper abdominal pain for 1 week with mild diarrhea, personal history of Crohn's disease, GERD, and hiatal hernia  EXAM: ABDOMEN - 2 VIEW  COMPARISON:  08/22/2013, 07/12/2013  FINDINGS: Large hiatal hernia with air-fluid levels within it. Left upper quadrant air-fluid levels possibly within stomach or small bowel. Single small right upper quadrant air-fluid level. Air-fluid levels in the mid abdomen inferiorly. No free air in the abdomen identified. Numerous surgical staples identified in the upper and lower abdomen consistent with prior bowel surgery.  When compared to 08/22/2013, the bowel gas pattern is similar. Correlating the appearance  with CT scan performed 07/12/2013, the pattern is consistent with known hiatal hernia as well as with the presence of areas of relative small bowel stricture ring and dilatation as seen on prior CT scan.  IMPRESSION: Known large hiatal hernia. Nonspecific air-fluid levels in small and large bowel, similar to prior studies, consistent with known alternating areas of stricturing and dilatation.   Electronically Signed   By: Skipper Cliche M.D.   On: 01/26/2015 14:18     Assessment/Plan Principal Problem:   UTI (lower urinary tract infection) Active Problems:   Crohn's disease   Hiatal hernia   Metabolic acidosis   Hyponatremia   Hypokalemia   Nausea and vomiting    UTI Urinalysis consistent with  UTI, patient is started on Rocephin. We'll adjust antibiotics according to the culture results.  Nausea, vomiting and diarrhea Patient has chronic symptoms is up every now and then, unclear of this related to her Crohn's disease. Patient was on Remicade and Humira and was recently discontinued. I will start her on 60 mg of prednisone, follow symptoms clinically. Treat symptomatically with Phenergan, rule out C. difficile colitis.  Acute renal failure Presents with creatinine of 2.44, baseline creatinine was 1.0 and December 2015. Patient is on Aldactone, diuretics held. Started on IV fluids, check BMP in a.m.  Metabolic acidosis Non-anion gap metabolic acidosis, likely secondary to GI losses from diarrhea and vomiting.  Cannot rule out fasting state and acute renal failure as contributors to the metabolic acidosis.  I don't think bicarbonate drip is indicated, hydrate with normal saline/D5. Follow BMP daily. If not improving then can start low rate bicarbonate drip.  Hypokalemia Likely secondary to diarrhea, patient had previous presentation with similar symptoms. Potassium repleted orally and parenterally, patient will be on potassium containing IV fluids. Given 1 dose of magnesium,  check mag in a.m.  Hyponatremia Mild hyponatremia with sodium of 133, likely secondary to dehydration continue IV fluids. Check BMP in a.m.    Code Status:  full code. Family Communication: plan discussed with the patient Disposition Plan: telemetry  Time spent: 70 minutes  [facility] Pager [phone]

## 2015-01-26 NOTE — ED Notes (Signed)
Called and talked to floor regarding bed status. Room is not clean at the time. Charge RN aware.

## 2015-01-26 NOTE — ED Provider Notes (Signed)
CSN: [id number]     Arrival date & time 01/26/15  1045 History   First MD Initiated Contact with Patient 01/26/15 1048     Chief Complaint  Patient presents with  . Abdominal Pain     (Consider location/radiation/quality/duration/timing/severity/associated sxs/prior Treatment) HPI   69 y.o. female with past medical history of Crohn's disease, chronic diarrhea and hiatal hernia with GERD came into the hospital complaining about nausea, vomiting and diarrhea. Symptoms started about 2 weeks ago but progressed over the past several days, patient developed generalized weakness and overall feeling unwell so she decided to come to the hospital for further evaluation. Patient reported she vomits about 3-4 times a day, usually after eating. Her diarrhea slowed down but she still vomiting and feeling nauseous. Denies significant abdominal pain. No distension.    Past Medical History  Diagnosis Date  . Crohn's disease   . Chronic diarrhea   . GERD (gastroesophageal reflux disease)   . Polyarthralgia   . Hiatal hernia   . Hemorrhoids   . Anemia   . Hiatal hernia     hiatal  . Pneumonia 2014  . Gout   . Psoriasis    Past Surgical History  Procedure Laterality Date  . Bowel resection  2009  . 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 Anal stenosis  ~ 2010  . Colonoscopy  2013  . Cholecystectomy  1990's  . Appendectomy  1980's  . Goiter  1999  . Esophagogastroduodenoscopy  11/11/2011    Procedure: ESOPHAGOGASTRODUODENOSCOPY (EGD);  Surgeon: Missy Sabins, MD;  Location: [facility] ENDOSCOPY;  Service: Endoscopy;  Laterality: N/A;  . Flexible sigmoidoscopy  11/11/2011    Procedure: FLEXIBLE SIGMOIDOSCOPY;  Surgeon: Missy Sabins, MD;  Location: [place];  Service: Endoscopy;  Laterality: N/A;  . Esophagogastroduodenoscopy  05/18/2012    Procedure: ESOPHAGOGASTRODUODENOSCOPY (EGD);  Surgeon: Rogene Houston, MD;  Location: [facility] ENDO [facility];  Service: Endoscopy;  Laterality: N/A;  200  . Colostomy takedown  2009     "only had it for about 1 month" (08/22/2013)  . Left heart catheterization with coronary angiogram N/A 07/09/2014    Procedure: LEFT HEART CATHETERIZATION WITH CORONARY ANGIOGRAM;  Surgeon: Peter M Martinique, MD;  Location: [facility] CATH LAB;  Service: Cardiovascular;  Laterality: N/A;   Family History  Problem Relation Age of Onset  . Diabetes Mother   . Healthy Sister   . Hypertension Brother   . Colon cancer Brother     died with colon cancer  . Heart disease Father    History  Substance Use Topics  . Smoking status: Former Smoker -- 0.12 packs/day for 4 years    Types: Cigarettes    Quit date: 10/05/1996  . Smokeless tobacco: Never Used     Comment: 08/22/2013 1 pack every two weeks when she did smoked  . Alcohol Use: 1.2 oz/week    2 Glasses of wine per week     Comment: 08/22/2013 "glass of wine maybe twice/wk"   OB History    No data available     Review of Systems  All systems reviewed and negative, other than as noted in HPI.   Allergies  Remicade; Zithromax; Aspirin; Ciprofloxacin; and Sulfa antibiotics  Home Medications   Prior to Admission medications   Medication Sig Start Date End Date Taking? Authorizing Provider  albuterol (PROVENTIL HFA;VENTOLIN HFA) 108 (90 BASE) MCG/ACT inhaler Inhale 2 puffs into the lungs every 6 (six) hours as needed for wheezing. 02/27/13  Yes  Nishant Dhungel, MD  Calcium Carbonate-Vitamin D (CALCIUM + D PO) Take 1 tablet by mouth daily.   Yes Historical Provider, MD  Cyanocobalamin (VITAMIN B-12 IJ) Inject 1 each as directed every 30 (thirty) days. Around the 1st week of each month   Yes Historical Provider, MD  dicyclomine (BENTYL) 10 MG/5ML syrup TAKE 2 TEASPOONSFUL 3 TIMES A DAY BEFORE MEALS 10/09/14  Yes Butch Penny, NP  diphenhydrAMINE (BENADRYL) 25 mg capsule Take 25 mg by mouth every 6 (six) hours as needed for itching. 03/22/14  Yes Adeline Saralyn Pilar, MD  diphenoxylate-atropine (LOMOTIL) 2.5-0.025 MG per tablet Take 1 tablet by mouth  4 (four) times daily as needed for diarrhea or loose stools. 12/03/14  Yes Butch Penny, NP  fluocinonide ointment (LIDEX) 0.10 % Apply 1 application topically 2 (two) times daily. Apply to arms, legs, abdomen 2-3 times daily 12/13/14  Yes Historical Provider, MD  HYDROcodone-acetaminophen (NORCO) 7.5-325 MG per tablet Take 1 tablet by mouth every 6 (six) hours as needed for moderate pain.   Yes Historical Provider, MD  Multiple Vitamin (MULTIVITAMIN WITH MINERALS) TABS Take 1 tablet by mouth daily.   Yes Historical Provider, MD  nystatin (MYCOSTATIN/NYSTOP) 100000 UNIT/GM POWD Apply three times a day affected area. 12/17/14  Yes Rogene Houston, MD  nystatin cream (MYCOSTATIN) Apply topically 2 (two) times daily. 11/22/14  Yes Belkys A Regalado, MD  ondansetron (ZOFRAN) 4 MG tablet TAKE 1 TABLET (4 MG TOTAL) BY MOUTH 2 (TWO) TIMES DAILY AS NEEDED FOR NAUSEA OR VOMITING. 12/27/14  Yes Rogene Houston, MD  pantoprazole (PROTONIX) 40 MG tablet Take 1 tablet (40 mg total) by mouth 2 (two) times daily before a meal. 01/17/15  Yes Butch Penny, NP  potassium chloride SA (KLOR-CON M20) 20 MEQ tablet Take 1 tablet (20 mEq total) by mouth daily. 11/22/14  Yes Belkys A Regalado, MD  promethazine (PHENERGAN) 12.5 MG tablet 1-2 po q4-6 h prn nausea or vomiting 01/20/15  Yes Danie Binder, MD  spironolactone (ALDACTONE) 100 MG tablet Take 100 mg by mouth daily.    Yes Historical Provider, MD  spironolactone (ALDACTONE) 50 MG tablet Take 1 tablet (50 mg total) by mouth daily. 01/17/15  Yes Rogene Houston, MD  triamcinolone cream (KENALOG) 0.1 % Apply 1 application topically 2 (two) times daily. 06/28/14  Yes Rogene Houston, MD  metroNIDAZOLE (FLAGYL) 250 MG tablet Take 1 tablet (250 mg total) by mouth 3 (three) times daily. 01/11/15   Rogene Houston, MD   BP 90/56 mmHg  Pulse 85  Temp(Src) 97.3 F (36.3 C) (Axillary)  Resp 18  SpO2 100% Physical Exam  Constitutional: She appears well-developed and  well-nourished. No distress.  HENT:  Head: Normocephalic and atraumatic.  Eyes: Conjunctivae are normal. Right eye exhibits no discharge. Left eye exhibits no discharge.  Neck: Neck supple.  Cardiovascular: Normal rate, regular rhythm and normal heart sounds.  Exam reveals no gallop and no friction rub.   No murmur heard. Pulmonary/Chest: Effort normal and breath sounds normal. No respiratory distress.  Abdominal: Soft. She exhibits no distension. There is no tenderness.  Surgical scars noted. Abdomen flat. Nontender.   Musculoskeletal: She exhibits no edema or tenderness.  Neurological: She is alert.  Skin: Skin is warm and dry.  Psychiatric: She has a normal mood and affect. Her behavior is normal. Thought content normal.  Nursing note and vitals reviewed.   ED Course  Procedures (including critical care time) Labs Review Labs Reviewed  COMPREHENSIVE METABOLIC PANEL - Abnormal; Notable for the following:    Sodium 133 (*)    Potassium 2.1 (*)    CO2 10 (*)    Glucose, Bld 180 (*)    Creatinine, Ser 2.44 (*)    Calcium 7.4 (*)    Albumin 2.5 (*)    Alkaline Phosphatase 134 (*)    GFR calc non Af Amer 19 (*)    GFR calc Af Amer 22 (*)    All other components within normal limits  CBC WITH DIFFERENTIAL/PLATELET - Abnormal; Notable for the following:    WBC 13.0 (*)    RDW 17.0 (*)    Platelets 460 (*)    Neutro Abs 8.2 (*)    Lymphs Abs 4.1 (*)    All other components within normal limits  LIPASE, BLOOD  URINALYSIS, ROUTINE W REFLEX MICROSCOPIC  MAGNESIUM  I-STAT CG4 LACTIC ACID, ED    Imaging Review No results found.   EKG Interpretation None      MDM   Final diagnoses:  Vomiting  Metabolic acidosis  Hypokalemia  AKI (acute kidney injury)    68yF with n/v and generalized weakness. Metabolic acidosis and hypokalemia. Will check mag. Hx of crohn's but denies abdominal pain typical of her flares. Does not seems distended, but has hx of prior abdominal  surgery and will XR to eval for possible obstruction although clinically I feel not likely. Previous admissions with similar presentations.  IVF. Electrolyte repletion. Will need admission.     Virgel Manifold, MD 01/31/15 1141

## 2015-01-26 NOTE — ED Notes (Signed)
Report given to RN on floor, Rapid Response RN called. Denise at bedside to evaluate patient, pt to be placed on unit when bed ready. Pt remains hypotensive, admitting physician aware. Pt is alert and oriented x4. NAD.

## 2015-01-26 NOTE — Progress Notes (Signed)
RN called for report.  

## 2015-01-26 NOTE — ED Notes (Signed)
Abdominal pain , n/v/d for 1 week. Skin is warm and dry;

## 2015-01-26 NOTE — Progress Notes (Signed)
NURSING PROGRESS NOTE  NICOLETTA HUSH [id number] Admission Data: 01/26/2015 6:23 PM Attending Provider: Verlee Monte, MD ZYY:QMGN,OIBBCW K, MD Code Status: full  Danielle [name] is a 69 y.o. female patient admitted from ED:  -No acute distress noted.  -No complaints of shortness of breath.  -No complaints of chest pain.   Cardiac Monitoring: Box # [id number] in place. Cardiac monitor yields:normal sinus rhythm.  Blood pressure 90/61, pulse 91, temperature 97.8 F (36.6 C), temperature source Oral, resp. rate 16, height 5' 4.5" (1.638 m), weight 55.475 kg (122 lb 4.8 oz), SpO2 100 %.   IV Fluids:  IV in place, occlusive dsg intact without redness, IV cath antecubital right, condition patent and no redness normal saline @100 .   Allergies:  Remicade; Zithromax; Aspirin; Ciprofloxacin; and Sulfa antibiotics  Past Medical History:   has a past medical history of Crohn's disease; Chronic diarrhea; GERD (gastroesophageal reflux disease); Polyarthralgia; Hiatal hernia; Hemorrhoids; Anemia; Hiatal hernia; Pneumonia (2014); Gout; and Psoriasis.  Past Surgical History:   has past surgical history that includes Bowel resection (2009); Colostomy (2009); ANAL STENOSIS (~ 2010); Colonoscopy (2013); Cholecystectomy (1990's); Appendectomy (1980's); goiter (1999); Esophagogastroduodenoscopy (11/11/2011); Flexible sigmoidoscopy (11/11/2011); Esophagogastroduodenoscopy (05/18/2012); Colostomy takedown (2009); and left heart catheterization with coronary angiogram (N/A, 07/09/2014).  Social History:   reports that she quit smoking about 18 years ago. Her smoking use included Cigarettes. She has a .48 pack-year smoking history. She has never used smokeless tobacco. She reports that she drinks about 1.2 oz of alcohol per week. She reports that she does not use illicit drugs.  Skin: warm dry intact   Patient/Family orientated to room. Information packet given to patient/family. Admission inpatient armband information  verified with patient/family to include name and date of birth and placed on patient arm. Side rails up x 2, fall assessment and education completed with patient/family. Patient/family able to verbalize understanding of risk associated with falls and verbalized understanding to call for assistance before getting out of bed. Call light within reach. Patient/family able to voice and demonstrate understanding of unit orientation instructions.    Will continue to evaluate and treat per MD orders.

## 2015-01-26 NOTE — ED Notes (Signed)
Lab called with critical Potassium of 2.1, CO2 of 10. EDP notified.

## 2015-01-26 NOTE — Progress Notes (Signed)
Pt c/o headache, and is asking for med . Checked bp is 88/50 mm of hg. On call provider Vivien Presto, NP notified  .  250 ml of NS bolus ordered and advised to give tylenol for headache . Will cont to monitor.

## 2015-01-27 LAB — BASIC METABOLIC PANEL
ANION GAP: 8 (ref 5–15)
ANION GAP: 9 (ref 5–15)
BUN: 18 mg/dL (ref 6–23)
BUN: 17 mg/dL (ref 6–23)
CALCIUM: 6.8 mg/dL — AB (ref 8.4–10.5)
CHLORIDE: 119 mmol/L — AB (ref 96–112)
CHLORIDE: 108 mmol/L (ref 96–112)
CO2: 7 mmol/L — CL (ref 19–32)
CO2: 14 mmol/L — ABNORMAL LOW (ref 19–32)
Calcium: 6.9 mg/dL — ABNORMAL LOW (ref 8.4–10.5)
Creatinine, Ser: 1.74 mg/dL — ABNORMAL HIGH (ref 0.50–1.10)
Creatinine, Ser: 1.31 mg/dL — ABNORMAL HIGH (ref 0.50–1.10)
GFR, EST AFRICAN AMERICAN: 34 mL/min — AB (ref >90)
GFR, EST AFRICAN AMERICAN: 48 mL/min — AB (ref >90)
GFR, EST NON AFRICAN AMERICAN: 29 mL/min — AB (ref >90)
GFR, EST NON AFRICAN AMERICAN: 41 mL/min — AB (ref >90)
GLUCOSE: 214 mg/dL — AB (ref 70–99)
Glucose, Bld: 105 mg/dL — ABNORMAL HIGH (ref 70–99)
Potassium: 2.7 mmol/L — CL (ref 3.5–5.1)
Potassium: 2.6 mmol/L — CL (ref 3.5–5.1)
SODIUM: 134 mmol/L — AB (ref 135–145)
Sodium: 131 mmol/L — ABNORMAL LOW (ref 135–145)

## 2015-01-27 LAB — CBC
HCT: 28.8 % — ABNORMAL LOW (ref 36.0–46.0)
Hemoglobin: 9.8 g/dL — ABNORMAL LOW (ref 12.0–15.0)
MCH: 27.1 pg (ref 26.0–34.0)
MCHC: 34 g/dL (ref 30.0–36.0)
MCV: 79.8 fL (ref 78.0–100.0)
PLATELETS: 349 10*3/uL (ref 150–400)
RBC: 3.61 MIL/uL — AB (ref 3.87–5.11)
RDW: 16.9 % — ABNORMAL HIGH (ref 11.5–15.5)
WBC: 9.8 10*3/uL (ref 4.0–10.5)

## 2015-01-27 LAB — URINE CULTURE: Colony Count: >=100000

## 2015-01-27 LAB — MRSA PCR SCREENING: MRSA BY PCR: POSITIVE — AB

## 2015-01-27 LAB — MAGNESIUM: Magnesium: 3.7 mg/dL — ABNORMAL HIGH (ref 1.5–2.5)

## 2015-01-27 LAB — LACTIC ACID, PLASMA: Lactic Acid, Venous: 1.7 mmol/L (ref 0.5–2.0)

## 2015-01-27 MED ORDER — CHLORHEXIDINE GLUCONATE CLOTH 2 % EX PADS
6.0000 | MEDICATED_PAD | Freq: Every day | CUTANEOUS | Status: DC
  Administered 2015-01-27 – 2015-01-28 (×2): 6 via TOPICAL

## 2015-01-27 MED ORDER — SODIUM CHLORIDE 0.9 % IV BOLUS (SEPSIS)
500.0000 mL | Freq: Once | INTRAVENOUS | Status: AC
  Administered 2015-01-27: 500 mL via INTRAVENOUS

## 2015-01-27 MED ORDER — STERILE WATER FOR INJECTION IV SOLN
INTRAVENOUS | Status: DC
  Administered 2015-01-27 – 2015-01-28 (×3): via INTRAVENOUS
  Filled 2015-01-27 (×5): qty 850

## 2015-01-27 MED ORDER — MUPIROCIN 2 % EX OINT
1.0000 "application " | TOPICAL_OINTMENT | Freq: Two times a day (BID) | CUTANEOUS | Status: AC
  Administered 2015-01-27 – 2015-01-31 (×8): 1 via NASAL
  Filled 2015-01-27: qty 22

## 2015-01-27 MED ORDER — SODIUM CHLORIDE 0.9 % IJ SOLN
10.0000 mL | INTRAMUSCULAR | Status: DC | PRN
  Administered 2015-02-02: 10 mL
  Filled 2015-01-27: qty 40

## 2015-01-27 MED ORDER — POTASSIUM CHLORIDE 20 MEQ/15ML (10%) PO SOLN
40.0000 meq | Freq: Once | ORAL | Status: AC
  Administered 2015-01-27: 40 meq via ORAL
  Filled 2015-01-27: qty 30

## 2015-01-27 MED ORDER — POTASSIUM CHLORIDE CRYS ER 20 MEQ PO TBCR
40.0000 meq | EXTENDED_RELEASE_TABLET | Freq: Once | ORAL | Status: AC
  Administered 2015-01-27: 40 meq via ORAL
  Filled 2015-01-27: qty 2

## 2015-01-27 MED ORDER — POTASSIUM CHLORIDE 20 MEQ/15ML (10%) PO SOLN
40.0000 meq | Freq: Once | ORAL | Status: AC
  Administered 2015-01-28: 40 meq via ORAL
  Filled 2015-01-27: qty 30

## 2015-01-27 MED ORDER — SODIUM CHLORIDE 0.9 % IJ SOLN
10.0000 mL | Freq: Two times a day (BID) | INTRAMUSCULAR | Status: DC
  Administered 2015-01-27: 10 mL

## 2015-01-27 MED ORDER — POTASSIUM CHLORIDE 10 MEQ/100ML IV SOLN
10.0000 meq | INTRAVENOUS | Status: DC
  Administered 2015-01-27 (×2): 10 meq via INTRAVENOUS
  Filled 2015-01-27 (×4): qty 100

## 2015-01-27 MED ORDER — POTASSIUM CHLORIDE 10 MEQ/100ML IV SOLN
10.0000 meq | INTRAVENOUS | Status: AC
  Administered 2015-01-27 (×2): 10 meq via INTRAVENOUS

## 2015-01-27 MED ORDER — POTASSIUM CHLORIDE CRYS ER 20 MEQ PO TBCR
30.0000 meq | EXTENDED_RELEASE_TABLET | Freq: Once | ORAL | Status: AC
  Administered 2015-01-28: 30 meq via ORAL
  Filled 2015-01-27: qty 1

## 2015-01-27 NOTE — Progress Notes (Signed)
Patient noted with several open ares to areas to the vagina, labia majora and labia minora. Also patient noted with a small red raised rash area to the left lower leg around the anterior ankle and an open area to the umbilicus (resembling and old PEG tube site. Patient stated," the rash is from an antibiotic reaction a long time ago. The open area on the stomach if from my leg".  Patient denied ever having a PEG tube. These finding were reported to S. Alene Mires NP who states she will come and assess the patient. Moisture barrier cream applied to peri area, guase applied to umbilicus and left ankle rash marked to determine if rash is spreading. Will continue to monitor and endorse.

## 2015-01-27 NOTE — Progress Notes (Addendum)
Shift Event:  Pt with BP 81/48, other VSS, asymptomatic. Pt received 250 ml NS bolus 4 hours ago. Record reviewed, pt with low SBPs in the 80s since admission on 01/26/15. PMH of Crohn's disease, chronic diarrhea, and multiple admissions with metabolic acidosis.  At bedside, chronically sick looking Caucasian female in NAD. Lungs- CTAB. Heart- RRR, no m,r,g. Abd- +BS, soft NTND.   Hypotension -BP  81/48, pt asymptomatic. At bedside, pt states has chronic hypotension from chronic diarrhea with SBPs in 80s-70s  -Ordered 500 ml NS bolus -repeat BP 54/49  Metabolic Acidosis -CO2 7, worsening from previous 10 -Lactic acid normal -Transfer to SDU for bicarb gtt, as per admitting MD note, and night MD  Hypokalemia -K 2.7 -replace with KDUR 40 meq, and 4 runs of IV [facility] [phone]

## 2015-01-27 NOTE — Progress Notes (Signed)
CRITICAL VALUE ALERT  Critical value received:  Potassium =2.7, CO2= 7  Date of notification:  01/27/15  Time of notification:  0304  Critical value read back:Yes.    Nurse who received alert:  Charlynn Grimes, RN  MD notified (1st page):  Jyl Heinz, PA  Time of first page:  [phone]  MD notified (2nd page):  Time of second page:  Responding MD:  Jyl Heinz, PA Time MD responded:  [phone]

## 2015-01-27 NOTE — Progress Notes (Signed)
Peripherally Inserted Central Catheter/Midline Placement  The IV Nurse has discussed with the patient and/or persons authorized to consent for the patient, the purpose of this procedure and the potential benefits and risks involved with this procedure.  The benefits include less needle sticks, lab draws from the catheter and patient may be discharged home with the catheter.  Risks include, but not limited to, infection, bleeding, blood clot (thrombus formation), and puncture of an artery; nerve damage and irregular heat beat.  Alternatives to this procedure were also discussed.  PICC/Midline Placement Documentation  PICC / Midline Double Lumen 32/35/57 PICC Right Basilic 35 cm 1 cm (Active)  Indication for Insertion or Continuance of Line Administration of hyperosmolar/irritating solutions (i.e. TPN, Vancomycin, etc.);Limited venous access - need for IV therapy >5 days (PICC only);Poor Vasculature-patient has had multiple peripheral attempts or PIVs lasting less than 24 hours 01/27/2015  7:17 PM  Exposed Catheter (cm) 1 cm 01/27/2015  7:17 PM  Site Assessment Clean;Dry;Intact 01/27/2015  7:17 PM  Lumen #1 Status Flushed;Saline locked;Blood return noted 01/27/2015  7:17 PM  Lumen #2 Status Flushed;Saline locked;Blood return noted 01/27/2015  7:17 PM  Dressing Type Transparent 01/27/2015  7:17 PM  Dressing Status Clean;Dry;Intact;Antimicrobial disc in place 01/27/2015  7:17 PM  Line Care Connections checked and tightened 01/27/2015  7:17 PM  Line Adjustment (NICU/IV Team Only) No 01/27/2015  7:17 PM  Dressing Intervention New dressing 01/27/2015  7:17 PM  Dressing Change Due 02/03/15 01/27/2015  7:17 PM       Rolena Infante 01/27/2015, 7:18 PM

## 2015-01-27 NOTE — Progress Notes (Signed)
Patient has arrived to unit

## 2015-01-27 NOTE — Progress Notes (Signed)
Patient stated that her potassium will not get higher than 2.9 that's her normal. However, hospital records reflect that this patient's potassium has been WNL (above 4.0) on previous hospital visits. Will continue to monitor and endorse.

## 2015-01-27 NOTE — Progress Notes (Signed)
[place] TEAM 1 - Stepdown/ICU TEAM Progress Note  ADREANNE YONO WUJ:[id number] DOB: 02-13-1946 DOA: 01/26/2015 PCP: Maggie Font, MD  Admit HPI / Brief Narrative: 69 y.o. female with past medical history of Crohn's disease, chronic diarrhea, and hiatal hernia with GERD who came to the hospital complaining of nausea, vomiting and diarrhea. Symptoms started about 2 weeks prior but progressed the the point the patient developed generalized weakness. Patient reported she vomits about 3-4 times a day, usually after eating. She is known to have a large complex HH.  In the ED she was found to be severely hypokalemic with potassium of 2.1, severely acidotic with bicarbonate of 10.  Patient also had acute renal failure with creatinine of 2.44.   HPI/Subjective: Pt has been noted by RN to be passing watery stool from her vagina.  The pt tells this MD that this has been going on for ~70month now, and that she has been seen by a GYN and her GI MD for this issue.  She feels that it has actually decresed in its severity.  She currently denies cp, sob, or HA.    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   Nausea, vomiting and diarrhea Remicade and Humira reportedly recently discontinued - placed on prednisone at admit - likely a combination of chronic issues exacerbated by acute UTI/pyelo as well as known severe HH - tx UTI and follow sx   Acute renal failure creatinine of 2.44 at admit - baseline creatinine 1.0 December 2015 - aldactone / diuretics held - crt is slowly improving w/ volume resuscitation   Non-gap Metabolic acidosis likely secondary to GI losses from diarrhea and vomiting + acute renal failure - bicarb gtt started as bicarb was falling - follow closely - suspect will improve w/ improving renal fxn   Hypokalemia likely secondary to diarrhea - cont to replace and follow closely   Hypomagnesemia Corrected - follow  as now above normal   Hyponatremia likely secondary to dehydration - improving with volume expansion - follow   Crohn's disease w/ chronic diarrhea  Will discuss w/ her GI MD in AM when office is open   Large complex hiatal hernia  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 Followed in [facility] - did not show up for most recent appointment   MRSA screen +  Code Status: FULL Family Communication: no family present at time of exam Disposition Plan: SDU  Consultants: none  Procedures: none  Antibiotics: Rocephin 2/20 >  DVT prophylaxis: SQ heparin   Objective: Blood pressure 94/63, pulse 93, temperature 96.3 F (35.7 C), temperature source Axillary, resp. rate 19, height 5\' 5"  (1.651 m), weight 55.792 kg (123 lb), SpO2 99 %.  Intake/Output Summary (Last 24 hours) at 01/27/15 0934 Last data filed at 01/27/15 9562  Gross per 24 hour  Intake   1950 ml  Output    350 ml  Net   1600 ml   Exam: General: No acute respiratory distress Lungs: Clear to auscultation bilaterally without wheezes or crackles Cardiovascular: Regular rate and rhythm without murmur gallop or rub normal S1 and S2 Abdomen: Nontender, nondistended, soft, bowel sounds positive, no rebound, no ascites, no appreciable mass Extremities: No significant cyanosis, clubbing, or edema bilateral lower extremities  Data Reviewed: Basic Metabolic Panel:  Recent Labs Lab 01/26/15 1118 01/26/15 1405 01/27/15 0210  NA 133*  --  134*  K 2.1*  --  2.7*  CL 110  --  119*  CO2 10*  --  7*  GLUCOSE 180*  --  214*  BUN 21  --  18  CREATININE 2.44*  --  1.74*  CALCIUM 7.4*  --  6.8*  MG  --  1.4* 3.7*    Liver Function Tests:  Recent Labs Lab 01/26/15 1118  AST 31  ALT 17  ALKPHOS 134*  BILITOT 0.6  PROT 8.0  ALBUMIN 2.5*    Recent Labs Lab 01/26/15 1118  LIPASE 26   Coags:  Recent Labs Lab 01/26/15 1955  INR 1.21   CBC:  Recent Labs Lab 01/26/15 1118 01/27/15 0210  WBC  13.0* 9.8  NEUTROABS 8.2*  --   HGB 12.4 9.8*  HCT 36.7 28.8*  MCV 80.7 79.8  PLT 460* 349    Recent Results (from the past 240 hour(s))  MRSA PCR Screening     Status: Abnormal   Collection Time: 01/27/15  4:28 AM  Result Value Ref Range Status   MRSA by PCR POSITIVE (A) NEGATIVE Final    Comment:        The GeneXpert MRSA Assay (FDA approved for NASAL specimens only), is one component of a comprehensive MRSA colonization surveillance program. It is not intended to diagnose MRSA infection nor to guide or monitor treatment for MRSA infections. RESULT CALLED TO, READ BACK BY AND VERIFIED WITH: Gilman Buttner RN 01/27/15 0856 [place]      Studies:  Recent x-ray studies have been reviewed in detail by the Attending Physician  Scheduled Meds:  Scheduled Meds: . cefTRIAXone (ROCEPHIN)  IV  1 g Intravenous Q24H  . Chlorhexidine Gluconate Cloth  6 each Topical Q0600  . heparin  5,000 Units Subcutaneous 3 times per day  . mupirocin ointment  1 application Nasal BID  . pantoprazole  40 mg Oral BID AC  . potassium chloride  60 mEq Oral Once  . predniSONE  60 mg Oral Q breakfast  . sodium chloride  3 mL Intravenous Q12H    Time spent on care of this patient: 35 mins   MCCLUNG,JEFFREY T , MD   [facility] Office  [phone] Pager - Text Page per Shea Evans as per below:  On-Call/Text Page:      [email or web address]      password [email or web address]  If 7PM-7AM, please contact night-coverage [email or web address] Password [facility] 01/27/2015, 9:34 AM   LOS: 1 day

## 2015-01-27 NOTE — Progress Notes (Signed)
Report received. Awaiting patient's arrival ro [id number]

## 2015-01-27 NOTE — Progress Notes (Signed)
Report given to Zigmund Daniel ,RN in 2 C.

## 2015-01-27 NOTE — Progress Notes (Signed)
Utilization Review Completed.Danielle [name] T2/21/2016  

## 2015-01-27 NOTE — Progress Notes (Signed)
Rechecked BP after 250 ml NS bolus given is 81/48 mm of hg. Pt is asymptomatic. On call provider Jyl Heinz, [place] notified via text page. Will monitor.

## 2015-01-27 NOTE — Progress Notes (Signed)
Rechecked  manual BP after 500 ml of NS bolus given is 83/60 mm of hg . Jyl Heinz, PA notified via text page. Will monitor.

## 2015-01-27 NOTE — Progress Notes (Signed)
Pt transferred to [facility] room 9. No s/s of distress noted during transport. All belonging sent with pt. Pt's sister Louretta Parma)  made aware.

## 2015-01-27 NOTE — Progress Notes (Signed)
CRITICAL VALUE ALERT  Critical value received:  Potassium 2.9  Date of notification:  01/27/2015  Time of notification:  2122  Critical value read back: Yes  Nurse who received alert:  Nani Ravens RN  MD notified (1st page):  Schorr NP  Time of first page:  2126  MD notified (2nd page):  Time of second page:  Responding MD:   Time MD responded:  2130

## 2015-01-27 NOTE — Progress Notes (Signed)
Patient's vaginal discharge appears to be stool. Jyl Heinz NP in to assess. Will endorse.

## 2015-01-28 ENCOUNTER — Ambulatory Visit (INDEPENDENT_AMBULATORY_CARE_PROVIDER_SITE_OTHER): Payer: Medicare Other | Admitting: Internal Medicine

## 2015-01-28 LAB — CBC
HCT: 23 % — ABNORMAL LOW (ref 36.0–46.0)
Hemoglobin: 8 g/dL — ABNORMAL LOW (ref 12.0–15.0)
MCH: 26.8 pg (ref 26.0–34.0)
MCHC: 34.8 g/dL (ref 30.0–36.0)
MCV: 76.9 fL — ABNORMAL LOW (ref 78.0–100.0)
Platelets: 300 10*3/uL (ref 150–400)
RBC: 2.99 MIL/uL — ABNORMAL LOW (ref 3.87–5.11)
RDW: 16.4 % — ABNORMAL HIGH (ref 11.5–15.5)
WBC: 13 10*3/uL — ABNORMAL HIGH (ref 4.0–10.5)

## 2015-01-28 LAB — BASIC METABOLIC PANEL
Anion gap: 5 (ref 5–15)
BUN: 11 mg/dL (ref 6–23)
CO2: 16 mmol/L — ABNORMAL LOW (ref 19–32)
Calcium: 6.6 mg/dL — ABNORMAL LOW (ref 8.4–10.5)
Chloride: 108 mmol/L (ref 96–112)
Creatinine, Ser: 1.06 mg/dL (ref 0.50–1.10)
GFR, EST AFRICAN AMERICAN: 62 mL/min — AB (ref >90)
GFR, EST NON AFRICAN AMERICAN: 53 mL/min — AB (ref >90)
Glucose, Bld: 127 mg/dL — ABNORMAL HIGH (ref 70–99)
Potassium: 3.5 mmol/L (ref 3.5–5.1)
Sodium: 129 mmol/L — ABNORMAL LOW (ref 135–145)

## 2015-01-28 LAB — MAGNESIUM: MAGNESIUM: 1.3 mg/dL — AB (ref 1.5–2.5)

## 2015-01-28 LAB — COMPREHENSIVE METABOLIC PANEL
ALK PHOS: 71 U/L (ref 39–117)
ALT: 9 U/L (ref 0–35)
AST: 14 U/L (ref 0–37)
Albumin: 1.2 g/dL — ABNORMAL LOW (ref 3.5–5.2)
Anion gap: 3 — ABNORMAL LOW (ref 5–15)
BILIRUBIN TOTAL: 0.2 mg/dL — AB (ref 0.3–1.2)
BUN: 12 mg/dL (ref 6–23)
CO2: 14 mmol/L — ABNORMAL LOW (ref 19–32)
Calcium: 4.8 mg/dL — CL (ref 8.4–10.5)
Chloride: 120 mmol/L — ABNORMAL HIGH (ref 96–112)
Creatinine, Ser: 0.92 mg/dL (ref 0.50–1.10)
GFR calc Af Amer: 73 mL/min — ABNORMAL LOW (ref >90)
GFR calc non Af Amer: 63 mL/min — ABNORMAL LOW (ref >90)
Glucose, Bld: 105 mg/dL — ABNORMAL HIGH (ref 70–99)
Potassium: 2.3 mmol/L — CL (ref 3.5–5.1)
Sodium: 137 mmol/L (ref 135–145)
Total Protein: 3.9 g/dL — ABNORMAL LOW (ref 6.0–8.3)

## 2015-01-28 LAB — CLOSTRIDIUM DIFFICILE BY PCR: Toxigenic C. Difficile by PCR: NEGATIVE

## 2015-01-28 LAB — HEMOGLOBIN A1C
HEMOGLOBIN A1C: 5.6 % (ref 4.8–5.6)
Mean Plasma Glucose: 114 mg/dL

## 2015-01-28 MED ORDER — SODIUM CHLORIDE 0.9 % IV SOLN
2.0000 g | Freq: Once | INTRAVENOUS | Status: AC
  Administered 2015-01-28: 2 g via INTRAVENOUS
  Filled 2015-01-28: qty 20

## 2015-01-28 MED ORDER — MUPIROCIN 2 % EX OINT: 1.0000 "application " | TOPICAL_OINTMENT | Freq: Two times a day (BID) | CUTANEOUS | Status: DC

## 2015-01-28 MED ORDER — MAGNESIUM SULFATE 2 GM/50ML IV SOLN
2.0000 g | Freq: Once | INTRAVENOUS | Status: AC
  Administered 2015-01-28: 2 g via INTRAVENOUS
  Filled 2015-01-28: qty 50

## 2015-01-28 MED ORDER — METRONIDAZOLE 500 MG PO TABS
500.0000 mg | ORAL_TABLET | Freq: Three times a day (TID) | ORAL | Status: DC
  Administered 2015-01-28 – 2015-02-03 (×17): 500 mg via ORAL
  Filled 2015-01-28 (×20): qty 1

## 2015-01-28 MED ORDER — CHLORHEXIDINE GLUCONATE CLOTH 2 % EX PADS
6.0000 | MEDICATED_PAD | Freq: Every day | CUTANEOUS | Status: AC
  Administered 2015-01-29 – 2015-02-02 (×3): 6 via TOPICAL

## 2015-01-28 MED ORDER — POTASSIUM CHLORIDE CRYS ER 20 MEQ PO TBCR
40.0000 meq | EXTENDED_RELEASE_TABLET | Freq: Two times a day (BID) | ORAL | Status: DC
  Administered 2015-01-28: 40 meq via ORAL
  Filled 2015-01-28: qty 2

## 2015-01-28 MED ORDER — POTASSIUM CHLORIDE CRYS ER 20 MEQ PO TBCR
40.0000 meq | EXTENDED_RELEASE_TABLET | Freq: Three times a day (TID) | ORAL | Status: DC
  Administered 2015-01-28 – 2015-01-29 (×4): 40 meq via ORAL
  Filled 2015-01-28 (×9): qty 2

## 2015-01-28 MED ORDER — POTASSIUM CHLORIDE IN NACL 20-0.9 MEQ/L-% IV SOLN
INTRAVENOUS | Status: DC
  Administered 2015-01-28 (×2): via INTRAVENOUS
  Filled 2015-01-28 (×6): qty 1000

## 2015-01-28 NOTE — Progress Notes (Signed)
[place] TEAM 1 - Stepdown/ICU TEAM Progress Note  Danielle [name] SWN:[id number] DOB: 1946-11-01 DOA: 01/26/2015 PCP: Maggie Font, MD  Admit HPI / Brief Narrative: 69 y.o. female with past medical history of Crohn's disease, chronic diarrhea, and large hiatal hernia with GERD who came to the hospital complaining of nausea, vomiting and diarrhea. Symptoms started about 2 weeks prior but progressed the the point the patient developed generalized weakness. Patient reported she vomits about 3-4 times a day, usually after eating.   In the ED she was found to be severely hypokalemic with potassium of 2.1, severely acidotic with bicarbonate of 10.  Patient also had acute renal failure with creatinine of 2.44.   HPI/Subjective: Pt feels that her diarrhea has improved, but does cont to pass liquid stool through her vagina.  She denies cp, n/v, sob, or current abdom pain.  She is very hungry and asks to be allowed to eat.    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 Laural Golden who states that a recent MRI noted a rectal area fistula but that at that point it had not yet connected to the vagina - I have placed a call to Gen Surg to discuss tx options   Acute renal failure creatinine of 2.44 at admit - baseline creatinine 1.0 December 2015 - aldactone / diuretics held - crt is  normalizing w/ volume resuscitation   Non-gap Metabolic acidosis likely secondary to GI losses from diarrhea and vomiting + acute renal failure - bicarb gtt started as bicarb was falling - improving and hypokalemia proving difficult to correct therefore will stop bicarb gtt and follow   Severe Hypokalemia likely secondary to diarrhea - cont to replace and follow closely   Hypomagnesemia Corrected but recurring, likely reflective of significant total body deficit - cont to replace and follow   Hyponatremia likely secondary to dehydration - resolved with volume expansion   Hypocalcemia Replace IV and oral - recheck in AM   Large complex hiatal hernia  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 Followed in [facility] - did not show up for most recent appointment   MRSA screen +  Code Status: FULL Family Communication: no family present at time of exam Disposition Plan: SDU  Consultants: none  Procedures: none  Antibiotics: Rocephin 2/20 > Flagyl 2/22 >  DVT prophylaxis: SQ heparin   Objective: Blood pressure 95/55, pulse 87, temperature 97.6 F (36.4 C), temperature source Oral, resp. rate 16, height 5\' 5"  (1.651 m), weight 55.792 kg (123 lb), SpO2 100 %.  Intake/Output Summary (Last 24 hours) at 01/28/15 1428 Last data filed at 01/28/15 0541  Gross per 24 hour  Intake    613 ml  Output    501 ml  Net    112 ml   Exam: General: No acute respiratory distress Lungs: Clear to  auscultation bilaterally without wheezes or crackles Cardiovascular: Regular rate and rhythm without murmur gallop or rub normal S1 and S2 Abdomen: Nontender, nondistended, soft, bowel sounds positive, no rebound, no ascites, no appreciable mass Extremities: No significant cyanosis, clubbing, edema bilateral lower extremities  Data Reviewed: Basic Metabolic Panel:  Recent Labs Lab 01/26/15 1118 01/26/15 1405 01/27/15 0210 01/27/15 1943 01/28/15 0600  NA 133*  --  134* 131* 137    K 2.1*  --  2.7* 2.6* 2.3*  CL 110  --  119* 108 120*  CO2 10*  --  7* 14* 14*  GLUCOSE 180*  --  214* 105* 105*  BUN 21  --  18 17 12   CREATININE 2.44*  --  1.74* 1.31* 0.92  CALCIUM 7.4*  --  6.8* 6.9* 4.8*  MG  --  1.4* 3.7*  --  1.3*    Liver Function Tests:  Recent Labs Lab 01/26/15 1118 01/28/15 0600  AST 31 14  ALT 17 9  ALKPHOS 134* 71  BILITOT 0.6 0.2*  PROT 8.0 3.9*  ALBUMIN 2.5* 1.2*    Recent Labs Lab 01/26/15 1118  LIPASE 26   Coags:  Recent Labs Lab 01/26/15 1955  INR 1.21   CBC:  Recent Labs Lab 01/26/15 1118 01/27/15 0210 01/28/15 0600  WBC 13.0* 9.8 13.0*  NEUTROABS 8.2*  --   --   HGB 12.4 9.8* 8.0*  HCT 36.7 28.8* 23.0*  MCV 80.7 79.8 76.9*  PLT 460* 349 300    Recent Results (from the past 240 hour(s))  Urine culture     Status: None   Collection Time: 01/26/15  1:14 PM  Result Value Ref Range Status   Specimen Description URINE, CLEAN CATCH  Final   Special Requests NONE  Final   Colony Count   Final    >=100,000 COLONIES/ML Performed at [facility]    Culture   Final    Multiple bacterial morphotypes present, none predominant. Suggest appropriate recollection if clinically indicated. Performed at [facility]    Report Status 01/27/2015 FINAL  Final  MRSA PCR Screening     Status: Abnormal   Collection Time: 01/27/15  4:28 AM  Result Value Ref Range Status   MRSA by PCR POSITIVE (A) NEGATIVE Final    Comment:        The GeneXpert MRSA Assay (FDA approved for NASAL specimens only), is one component of a comprehensive MRSA colonization surveillance program. It is not intended to diagnose MRSA infection nor to guide or monitor treatment for MRSA infections. RESULT CALLED TO, READ BACK BY AND VERIFIED WITH: Gilman Buttner RN 01/27/15 0856 [place]   Clostridium Difficile by PCR     Status: None   Collection Time: 01/28/15 12:23 AM  Result Value Ref Range Status   C difficile by pcr NEGATIVE  NEGATIVE Final     Studies:  Recent x-ray studies have been reviewed in detail by the Attending Physician  Scheduled Meds:  Scheduled Meds: . cefTRIAXone (ROCEPHIN)  IV  1 g Intravenous Q24H  . Chlorhexidine Gluconate Cloth  6 each Topical Q0600  . heparin  5,000 Units Subcutaneous 3 times per day  . mupirocin ointment  1 application Nasal BID  . pantoprazole  40 mg Oral BID AC  . potassium chloride  40 mEq Oral BID  . predniSONE  60 mg Oral Q breakfast  . sodium chloride  10-40 mL Intracatheter Q12H  . sodium chloride  3 mL Intravenous Q12H  Time spent on care of this patient: 35 mins   [facility] T , MD   [facility] Office  [phone] Pager - Text Page per Shea Evans as per below:  On-Call/Text Page:      [email or web address]      password [email or web address]  If 7PM-7AM, please contact night-coverage [email or web address] Password [email or web address] 01/28/2015, 2:28 PM   LOS: 2 days

## 2015-01-29 ENCOUNTER — Encounter (HOSPITAL_COMMUNITY): Payer: Self-pay | Admitting: General Surgery

## 2015-01-29 DIAGNOSIS — N179 Acute kidney failure, unspecified: Secondary | ICD-10-CM

## 2015-01-29 DIAGNOSIS — E876 Hypokalemia: Secondary | ICD-10-CM

## 2015-01-29 DIAGNOSIS — K50913 Crohn's disease, unspecified, with fistula: Secondary | ICD-10-CM

## 2015-01-29 DIAGNOSIS — R197 Diarrhea, unspecified: Secondary | ICD-10-CM

## 2015-01-29 DIAGNOSIS — N823 Fistula of vagina to large intestine: Secondary | ICD-10-CM

## 2015-01-29 DIAGNOSIS — R112 Nausea with vomiting, unspecified: Secondary | ICD-10-CM

## 2015-01-29 LAB — CBC WITH DIFFERENTIAL/PLATELET
Basophils Absolute: 0 10*3/uL (ref 0.0–0.1)
Basophils Relative: 0 % (ref 0–1)
EOS PCT: 0 % (ref 0–5)
Eosinophils Absolute: 0 10*3/uL (ref 0.0–0.7)
HCT: 22.8 % — ABNORMAL LOW (ref 36.0–46.0)
HEMOGLOBIN: 7.7 g/dL — AB (ref 12.0–15.0)
LYMPHS ABS: 2.1 10*3/uL (ref 0.7–4.0)
Lymphocytes Relative: 18 % (ref 12–46)
MCH: 26.8 pg (ref 26.0–34.0)
MCHC: 33.8 g/dL (ref 30.0–36.0)
MCV: 79.4 fL (ref 78.0–100.0)
Monocytes Absolute: 0.7 10*3/uL (ref 0.1–1.0)
Monocytes Relative: 6 % (ref 3–12)
Neutro Abs: 8.6 10*3/uL — ABNORMAL HIGH (ref 1.7–7.7)
Neutrophils Relative %: 76 % (ref 43–77)
PLATELETS: 262 10*3/uL (ref 150–400)
RBC: 2.87 MIL/uL — ABNORMAL LOW (ref 3.87–5.11)
RDW: 16.8 % — ABNORMAL HIGH (ref 11.5–15.5)
WBC: 11.4 10*3/uL — ABNORMAL HIGH (ref 4.0–10.5)

## 2015-01-29 LAB — COMPREHENSIVE METABOLIC PANEL
ALBUMIN: 1.8 g/dL — AB (ref 3.5–5.2)
ALK PHOS: 88 U/L (ref 39–117)
ALT: 10 U/L (ref 0–35)
ANION GAP: 1 — AB (ref 5–15)
AST: 13 U/L (ref 0–37)
BUN: 13 mg/dL (ref 6–23)
CO2: 19 mmol/L (ref 19–32)
Calcium: 7 mg/dL — ABNORMAL LOW (ref 8.4–10.5)
Chloride: 116 mmol/L — ABNORMAL HIGH (ref 96–112)
Creatinine, Ser: 1.07 mg/dL (ref 0.50–1.10)
GFR calc Af Amer: 61 mL/min — ABNORMAL LOW (ref >90)
GFR calc non Af Amer: 52 mL/min — ABNORMAL LOW (ref >90)
Glucose, Bld: 107 mg/dL — ABNORMAL HIGH (ref 70–99)
POTASSIUM: 4.5 mmol/L (ref 3.5–5.1)
Sodium: 136 mmol/L (ref 135–145)
Total Bilirubin: 0.3 mg/dL (ref 0.3–1.2)
Total Protein: 5.1 g/dL — ABNORMAL LOW (ref 6.0–8.3)

## 2015-01-29 LAB — BASIC METABOLIC PANEL
Anion gap: 5 (ref 5–15)
BUN: 12 mg/dL (ref 6–23)
CHLORIDE: 118 mmol/L — AB (ref 96–112)
CO2: 14 mmol/L — ABNORMAL LOW (ref 19–32)
Calcium: 6.2 mg/dL — CL (ref 8.4–10.5)
Creatinine, Ser: 0.99 mg/dL (ref 0.50–1.10)
GFR calc Af Amer: 67 mL/min — ABNORMAL LOW (ref >90)
GFR calc non Af Amer: 58 mL/min — ABNORMAL LOW (ref >90)
Glucose, Bld: 116 mg/dL — ABNORMAL HIGH (ref 70–99)
Potassium: 5 mmol/L (ref 3.5–5.1)
Sodium: 137 mmol/L (ref 135–145)

## 2015-01-29 LAB — PHOSPHORUS
Phosphorus: <1 mg/dL — CL (ref 2.3–4.6)
Phosphorus: <1 mg/dL — CL (ref 2.3–4.6)

## 2015-01-29 LAB — MAGNESIUM: Magnesium: 2 mg/dL (ref 1.5–2.5)

## 2015-01-29 MED ORDER — DICYCLOMINE HCL 10 MG/5ML PO SOLN
10.0000 mg | Freq: Three times a day (TID) | ORAL | Status: DC
Start: 2015-01-29 — End: 2015-02-03
  Administered 2015-01-29 – 2015-02-03 (×15): 10 mg via ORAL
  Filled 2015-01-29 (×19): qty 5

## 2015-01-29 MED ORDER — POTASSIUM PHOSPHATES 15 MMOLE/5ML IV SOLN
30.0000 mmol | Freq: Once | INTRAVENOUS | Status: DC
  Filled 2015-01-29: qty 10

## 2015-01-29 MED ORDER — DEXTROSE 5 % IV SOLN
20.0000 meq | Freq: Once | INTRAVENOUS | Status: AC
  Administered 2015-01-29: 20 meq via INTRAVENOUS
  Filled 2015-01-29: qty 5

## 2015-01-29 MED ORDER — K PHOS MONO-SOD PHOS DI & MONO 155-852-130 MG PO TABS
500.0000 mg | ORAL_TABLET | Freq: Once | ORAL | Status: AC
  Administered 2015-01-29: 500 mg via ORAL
  Filled 2015-01-29: qty 2

## 2015-01-29 MED ORDER — SODIUM CHLORIDE 0.9 % IV SOLN
INTRAVENOUS | Status: DC
  Administered 2015-01-29 – 2015-01-30 (×2): via INTRAVENOUS
  Administered 2015-01-31 (×2): 50 mL/h via INTRAVENOUS
  Administered 2015-02-01 – 2015-02-02 (×2): via INTRAVENOUS
  Administered 2015-02-03: 1 mL via INTRAVENOUS

## 2015-01-29 MED ORDER — CALCIUM CHLORIDE 10 % IV SOLN
1.0000 g | Freq: Once | INTRAVENOUS | Status: AC
  Administered 2015-01-29: 1 g via INTRAVENOUS
  Filled 2015-01-29: qty 10

## 2015-01-29 MED ORDER — DIPHENOXYLATE-ATROPINE 2.5-0.025 MG PO TABS
1.0000 | ORAL_TABLET | Freq: Four times a day (QID) | ORAL | Status: DC
  Administered 2015-01-29 – 2015-02-03 (×19): 1 via ORAL
  Filled 2015-01-29 (×19): qty 1

## 2015-01-29 MED ORDER — K PHOS MONO-SOD PHOS DI & MONO 155-852-130 MG PO TABS
250.0000 mg | ORAL_TABLET | Freq: Once | ORAL | Status: AC
  Administered 2015-01-29: 250 mg via ORAL
  Filled 2015-01-29: qty 1

## 2015-01-29 NOTE — Progress Notes (Signed)
[facility] staff in with pelvic exam

## 2015-01-29 NOTE — Progress Notes (Signed)
CRITICAL VALUE ALERT  Critical value received:  Phosphorus  Date of notification:  01/28/2015  Time of notification: 3295  Critical value read back:Yes.    Nurse who received alert: Will Bonnet  MD notified (1st page): Dr. Sherral Hammers  Time of first page:  0752  MD notified (2nd page):  Time of second page:  Responding MD:  Dr. Sherral Hammers  Time MD responded:  0800

## 2015-01-29 NOTE — Progress Notes (Signed)
Dr. Sherral Hammers paged regarding Phosphorus and calcium level

## 2015-01-29 NOTE — Progress Notes (Signed)
I spoke with our colorectal surgeon who also felt as though EUA with [name] would be best.  Will schedule for Thursday.  Kathryne Eriksson. [name], MD, [place] [phone] [facility]

## 2015-01-29 NOTE — Consult Note (Signed)
Reason for Consult: colovaginal fistula Referring Physician: Dr. Dia Crawford   HPI: Danielle [name] is a 69 year old female with a history of Crohn's disease s/p bowel resection and colostomy with eventual takedown by Dr. Anthony Sar in 2006, hiatal hernia, chronic diarrhea and GERD admitted on 01/26/15 with vomiting and diarrhea.  She was found to have hypokalemia, acute renal failure, metabolic acidosis, UTI and was therefore admitted.  She was noted to have watery stool and air passing from her vagina.  We have therefore been asked to evaluate for a colovaginal fistula and further management.    The patient reports this started about 3 months ago. She apparently saw a OBGYN, Dr. Derenda Mis and was given a "suppository" and her fistula resolved.  It then started again about 1 month ago.  Her symptoms are mild in severity and not bothersome.  She reports increase in drainage after her diet was resumed, but once again very little.  She reports chronic diarrhea and takes Lomotil.  She is followed by Dr. Laural Golden in [place] for her Crohn's.  She was on Remicade, but was stopped due to reaction to Azithromycin which she took for a cold.  The plan is to resume her Remicade.  She is currently on prednisone.  Previous abdominal surgeries include; bowel resection, appendectomy and cholecystectomy.  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  Renal function has improved,  Potassium has been replaced.  White count on 2/22 was up at 13K, c diff is negative.  She continues to have diarrhea, Lomotil has been resumed.  She is tolerating a regular diet.  Rocephin started on admission, flagyl added on 2/22 for UTI.    Past Medical History  Diagnosis Date  . Crohn's disease   . Chronic diarrhea   . GERD (gastroesophageal reflux disease)   . Polyarthralgia   . Hiatal hernia   . Hemorrhoids   . Anemia   . Hiatal hernia     hiatal  . Pneumonia 2014  . Gout   . Psoriasis     Past Surgical  History  Procedure Laterality Date  . Bowel resection  2009  . 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 Anal stenosis  ~ 2010  . Colonoscopy  2013  . Cholecystectomy  1990's  . Appendectomy  1980's  . Goiter  1999  . Esophagogastroduodenoscopy  11/11/2011    Procedure: ESOPHAGOGASTRODUODENOSCOPY (EGD);  Surgeon: Missy Sabins, MD;  Location: [facility] ENDOSCOPY;  Service: Endoscopy;  Laterality: N/A;  . Flexible sigmoidoscopy  11/11/2011    Procedure: FLEXIBLE SIGMOIDOSCOPY;  Surgeon: Missy Sabins, MD;  Location: [place];  Service: Endoscopy;  Laterality: N/A;  . Esophagogastroduodenoscopy  05/18/2012    Procedure: ESOPHAGOGASTRODUODENOSCOPY (EGD);  Surgeon: Rogene Houston, MD;  Location: [facility] ENDO SUITE;  Service: Endoscopy;  Laterality: N/A;  200  . Colostomy takedown  2009    "only had it for about 1 month" (08/22/2013)  . Left heart catheterization with coronary angiogram N/A 07/09/2014    Procedure: LEFT HEART CATHETERIZATION WITH CORONARY ANGIOGRAM;  Surgeon: Peter M Martinique, MD;  Location: [facility] CATH LAB;  Service: Cardiovascular;  Laterality: N/A;    Family History  Problem Relation Age of Onset  . Diabetes Mother   . Healthy Sister   . Hypertension Brother   . Colon cancer Brother     died with colon cancer  . Heart disease Father     Social History:  reports that she quit smoking about 72  years ago. Her smoking use included Cigarettes. She has a .48 pack-year smoking history. She has never used smokeless tobacco. She reports that she drinks about 1.2 oz of alcohol per week. She reports that she does not use illicit drugs.  Allergies:  Allergies  Allergen Reactions  . Remicade [Infliximab] Rash    Per Patient - she has developed a rash. Being seen by Dr. Flisher/Dermatology @ [facility].   . Zithromax [Azithromycin] Swelling    Patient states that she has a swollen mouth/ blisters, yeast present mouth and vaginia.  . Aspirin Other (See Comments)    Cramps her stomach  . Ciprofloxacin      unknown  . Sulfa Antibiotics Nausea And Vomiting    Medications:  Scheduled Meds: . cefTRIAXone (ROCEPHIN)  IV  1 g Intravenous Q24H  . Chlorhexidine Gluconate Cloth  6 each Topical Q0600  . dicyclomine  10 mg Oral TID  . diphenoxylate-atropine  1 tablet Oral QID  . heparin  5,000 Units Subcutaneous 3 times per day  . metroNIDAZOLE  500 mg Oral 3 times per day  . mupirocin ointment  1 application Nasal BID  . pantoprazole  40 mg Oral BID AC  . phosphorus  250 mg Oral Once  . potassium chloride  40 mEq Oral TID  . predniSONE  60 mg Oral Q breakfast  . sodium chloride  3 mL Intravenous Q12H  . sodium phosphate  Dextrose 5% IVPB  20 mEq Intravenous Once   Continuous Infusions: . 0.9 % NaCl with KCl 20 mEq / L 75 mL/hr at 01/28/15 2211   PRN Meds:.acetaminophen **OR** acetaminophen, HYDROcodone-acetaminophen, morphine injection, promethazine, sodium chloride   Results for orders placed or performed during the hospital encounter of 01/26/15 (from the past 48 hour(s))  Basic metabolic panel     Status: Abnormal   Collection Time: 01/27/15  7:43 PM  Result Value Ref Range   Sodium 131 (L) 135 - 145 mmol/L   Potassium 2.6 (LL) 3.5 - 5.1 mmol/L    Comment: REPEATED TO VERIFY CRITICAL RESULT CALLED TO, READ BACK BY AND VERIFIED WITH: ABDUSSALAAM [facility] 01/27/15 2118 WAYK    Chloride 108 96 - 112 mmol/L   CO2 14 (L) 19 - 32 mmol/L   Glucose, Bld 105 (H) 70 - 99 mg/dL   BUN 17 6 - 23 mg/dL   Creatinine, Ser 1.31 (H) 0.50 - 1.10 mg/dL   Calcium 6.9 (L) 8.4 - 10.5 mg/dL   GFR calc non Af Amer 41 (L) >90 mL/min   GFR calc Af Amer 48 (L) >90 mL/min    Comment: (NOTE) The eGFR has been calculated using the CKD EPI equation. This calculation has not been validated in all clinical situations. eGFR's persistently <90 mL/min signify possible Chronic Kidney Disease.    Anion gap 9 5 - 15  Clostridium Difficile by PCR     Status: None   Collection Time: 01/28/15 12:23 AM  Result Value Ref  Range   C difficile by pcr NEGATIVE NEGATIVE  CBC     Status: Abnormal   Collection Time: 01/28/15  6:00 AM  Result Value Ref Range   WBC 13.0 (H) 4.0 - 10.5 K/uL   RBC 2.99 (L) 3.87 - 5.11 MIL/uL   Hemoglobin 8.0 (L) 12.0 - 15.0 g/dL    Comment: REPEATED TO VERIFY   HCT 23.0 (L) 36.0 - 46.0 %   MCV 76.9 (L) 78.0 - 100.0 fL   MCH 26.8 26.0 - 34.0 pg  MCHC 34.8 30.0 - 36.0 g/dL   RDW 16.4 (H) 11.5 - 15.5 %   Platelets 300 150 - 400 K/uL  Comprehensive metabolic panel     Status: Abnormal   Collection Time: 01/28/15  6:00 AM  Result Value Ref Range   Sodium 137 135 - 145 mmol/L   Potassium 2.3 (LL) 3.5 - 5.1 mmol/L    Comment: REPEATED TO VERIFY CRITICAL RESULT CALLED TO, READ BACK BY AND VERIFIED WITH: C.WHITE,RN 0746 01/28/15 CLARK,S    Chloride 120 (H) 96 - 112 mmol/L   CO2 14 (L) 19 - 32 mmol/L   Glucose, Bld 105 (H) 70 - 99 mg/dL   BUN 12 6 - 23 mg/dL   Creatinine, Ser 0.92 0.50 - 1.10 mg/dL   Calcium 4.8 (LL) 8.4 - 10.5 mg/dL    Comment: REPEATED TO VERIFY CRITICAL RESULT CALLED TO, READ BACK BY AND VERIFIED WITH: C.WHITE,RN 0746 01/28/15 CLARK,S    Total Protein 3.9 (L) 6.0 - 8.3 g/dL   Albumin 1.2 (L) 3.5 - 5.2 g/dL   AST 14 0 - 37 U/L   ALT 9 0 - 35 U/L   Alkaline Phosphatase 71 39 - 117 U/L   Total Bilirubin 0.2 (L) 0.3 - 1.2 mg/dL   GFR calc non Af Amer 63 (L) >90 mL/min   GFR calc Af Amer 73 (L) >90 mL/min    Comment: (NOTE) The eGFR has been calculated using the CKD EPI equation. This calculation has not been validated in all clinical situations. eGFR's persistently <90 mL/min signify possible Chronic Kidney Disease.    Anion gap 3 (L) 5 - 15  Magnesium     Status: Abnormal   Collection Time: 01/28/15  6:00 AM  Result Value Ref Range   Magnesium 1.3 (L) 1.5 - 2.5 mg/dL  Basic metabolic panel     Status: Abnormal   Collection Time: 01/28/15  6:00 PM  Result Value Ref Range   Sodium 129 (L) 135 - 145 mmol/L   Potassium 3.5 3.5 - 5.1 mmol/L   Chloride  108 96 - 112 mmol/L   CO2 16 (L) 19 - 32 mmol/L   Glucose, Bld 127 (H) 70 - 99 mg/dL   BUN 11 6 - 23 mg/dL   Creatinine, Ser 1.06 0.50 - 1.10 mg/dL   Calcium 6.6 (L) 8.4 - 10.5 mg/dL   GFR calc non Af Amer 53 (L) >90 mL/min   GFR calc Af Amer 62 (L) >90 mL/min    Comment: (NOTE) The eGFR has been calculated using the CKD EPI equation. This calculation has not been validated in all clinical situations. eGFR's persistently <90 mL/min signify possible Chronic Kidney Disease.    Anion gap 5 5 - 15  Comprehensive metabolic panel     Status: Abnormal   Collection Time: 01/29/15  5:52 AM  Result Value Ref Range   Sodium 136 135 - 145 mmol/L    Comment: DELTA CHECK NOTED   Potassium 4.5 3.5 - 5.1 mmol/L    Comment: NO VISIBLE HEMOLYSIS   Chloride 116 (H) 96 - 112 mmol/L   CO2 19 19 - 32 mmol/L   Glucose, Bld 107 (H) 70 - 99 mg/dL   BUN 13 6 - 23 mg/dL   Creatinine, Ser 1.07 0.50 - 1.10 mg/dL   Calcium 7.0 (L) 8.4 - 10.5 mg/dL   Total Protein 5.1 (L) 6.0 - 8.3 g/dL   Albumin 1.8 (L) 3.5 - 5.2 g/dL   AST 13 0 - 37  U/L   ALT 10 0 - 35 U/L   Alkaline Phosphatase 88 39 - 117 U/L   Total Bilirubin 0.3 0.3 - 1.2 mg/dL   GFR calc non Af Amer 52 (L) >90 mL/min   GFR calc Af Amer 61 (L) >90 mL/min    Comment: (NOTE) The eGFR has been calculated using the CKD EPI equation. This calculation has not been validated in all clinical situations. eGFR's persistently <90 mL/min signify possible Chronic Kidney Disease.    Anion gap 1 (L) 5 - 15    Comment: REPEATED TO VERIFY  Magnesium     Status: None   Collection Time: 01/29/15  5:52 AM  Result Value Ref Range   Magnesium 2.0 1.5 - 2.5 mg/dL  Phosphorus     Status: Abnormal   Collection Time: 01/29/15  5:52 AM  Result Value Ref Range   Phosphorus <1.0 (LL) 2.3 - 4.6 mg/dL    Comment: CRITICAL RESULT CALLED TO, READ BACK BY AND VERIFIED WITH: WHITE C RN 01/29/15 0750 COSTELLO B REPEATED TO VERIFY     No results found.  Review of  Systems  All other systems reviewed and are negative.  Blood pressure 87/51, pulse 81, temperature 97.9 F (36.6 C), temperature source Oral, resp. rate 20, height 5' 5"  (1.651 m), weight 123 lb (55.792 kg), SpO2 99 %. Physical Exam  Constitutional: She is oriented to person, place, and time. She appears well-developed and well-nourished. No distress.  Cardiovascular: Normal rate, regular rhythm, normal heart sounds and intact distal pulses.  Exam reveals no gallop and no friction rub.   No murmur heard. Respiratory: Effort normal and breath sounds normal. No respiratory distress. She has no wheezes. She has no rales. She exhibits no tenderness.  GI: Soft. Bowel sounds are normal. She exhibits no distension and no mass. There is no rebound and no guarding.  Mild RLQ tenderness  Musculoskeletal: Normal range of motion. She exhibits no edema.  Neurological: She is alert and oriented to person, place, and time.  Skin: Skin is warm and dry. She is not diaphoretic.  Psychiatric: She has a normal mood and affect. Her behavior is normal. Judgment and thought content normal.    Assessment/Plan: Crohn's disease s/p bowel resection 2006(Dr. 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  Will proceed with a pelvic exam and proctoscopy today for further evaluation.  Thank you for the consult.     Mujahid Jalomo ANP-BC 01/29/2015, 9:59 AM

## 2015-01-29 NOTE — Progress Notes (Signed)
NP made aware of critical labs that were not responded too previously, also that pts low K has now resolved. Orders in place.

## 2015-01-29 NOTE — Progress Notes (Signed)
[place] TEAM 1 - Stepdown/ICU TEAM Progress Note  Danielle [name] IRW:[id number] DOB: Apr 08, 1946 DOA: 01/26/2015 PCP: Maggie Font, MD  Admit HPI / Brief Narrative: 69 y.o. BF PMHx  Crohn's disease, chronic diarrhea, and large hiatal hernia, anemia, gout with GERD who came to the hospital complaining of nausea, vomiting and diarrhea. Symptoms started about 2 weeks prior but progressed the the point the patient developed generalized weakness. Patient reported she vomits about 3-4 times a day, usually after eating.   In the ED she was found to be severely hypokalemic with potassium of 2.1, severely acidotic with bicarbonate of 10. Patient also had acute renal failure with creatinine of 2.44.    HPI/Subjective: 2/23 A/O 4, continues to have diarrhea. States has been having diarrhea per vagina ~1 month. Negative CP negative SOB. States had multiple abdominal surgeries to include to colon resections secondary to Crohn's.     Assessment/Plan: UTI -Cont empiric abx  - culture data noted mult bacterial morphotypes - most likely caused by rectovaginal fistula -Continue flagyl to broaden anaerobic coverage   Acute on chronic Nausea, vomiting and diarrhea likely a combination of chronic issues exacerbated by acute UTI/pyelo as well as known severe HH - tx UTI and follow sx - clinically improving   Crohn's disease w fistula/ chronic diarrhea  Remicade and Humira reportedly recently discontinued - placed on prednisone at admit - her GI MD plans to resume Remicade when he sees her in f/u  -Bentyl 10 mg TID (home dose) -Lomotil QID (home dose)  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 Laural Golden who states that a recent MRI noted a rectal area fistula but that at that point it had not yet connected to the vagina - I have placed a call to Gen Surg to discuss tx options  -Consulted general surgery who will evaluate  Acute  renal failure creatinine of 2.44 at admit - baseline creatinine 1.0 December 2015 - aldactone / diuretics held - crt is normalizing w/ volume resuscitation   Non-gap Metabolic acidosis likely secondary to GI losses from diarrhea and vomiting + acute renal failure - bicarb gtt started as bicarb was falling - improving and hypokalemia proving difficult to correct therefore will stop bicarb gtt and follow   Severe Hypokalemia -likely secondary to diarrhea - cont to replace and follow closely   Hypomagnesemia -Corrected but recurring, likely reflective of significant total body deficit - cont to replace and follow   Hyponatremia -likely secondary to dehydration - resolved with volume expansion   Hypocalcemia -Replace IV and oral - recheck in AM  -Obtain albumin to calculate corrected calcium  Hypophosphatemia -Sodium phosphate -K-Phos 250 mg 1  Large complex hiatal hernia  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   Followed in [facility] - did not show up for most recent appointment  Code Status: FULL Family Communication: no family present at time of exam Disposition Plan: Per surgery    Consultants: Dr. Laural Golden ([facility]) phone consult Surgery consult pending   Procedure/Significant Events:   2/20 abdominal x-ray;Known large hiatal hernia. Nonspecific air-fluid levels in small/Lg alternating areas of stricturing and dilatation.   Culture 2/20 urine positive multiple bacterial morphotypes 2/21 MRSA by PCR positive 2/22 C. difficile negative    Antibiotics: Rocephin 2/20 > Flagyl 2/22 >  DVT prophylaxis: SQ heparin    Devices NA   LINES / TUBES:  NA  Continuous Infusions: . 0.9 % NaCl with KCl 20 mEq / L 75 mL/hr at 01/28/15 2211    Objective: VITAL SIGNS: Temp: 97.2 F (36.2 C) (02/23 1553) Temp Source: Axillary (02/23 1553) BP: 100/52 mmHg (02/23 1553) Pulse Rate: 86 (02/23 1553) SPO2; FIO2:   Intake/Output Summary (Last  24 hours) at 01/29/15 1857 Last data filed at 01/28/15 2300  Gross per 24 hour  Intake    375 ml  Output      0 ml  Net    375 ml     Exam: General: A/O 4, NAD, No acute respiratory distress Lungs: Clear to auscultation bilaterally without wheezes or crackles Cardiovascular: Regular rate and rhythm without murmur gallop or rub normal S1 and S2 Abdomen:Tender to palpation diffusely, nondistended, soft, bowel sounds positive, no rebound, no ascites, no appreciable mass Extremities: No significant cyanosis, clubbing, or edema bilateral lower extremities  Data Reviewed: Basic Metabolic Panel:  Recent Labs Lab 01/26/15 1405 01/27/15 0210 01/27/15 1943 01/28/15 0600 01/28/15 1800 01/29/15 0552 01/29/15 1600  NA  --  134* 131* 137 129* 136 137  K  --  2.7* 2.6* 2.3* 3.5 4.5 5.0  CL  --  119* 108 120* 108 116* 118*  CO2  --  7* 14* 14* 16* 19 14*  GLUCOSE  --  214* 105* 105* 127* 107* 116*  BUN  --  18 17 12 11 13 12   CREATININE  --  1.74* 1.31* 0.92 1.06 1.07 0.99  CALCIUM  --  6.8* 6.9* 4.8* 6.6* 7.0* 6.2*  MG 1.4* 3.7*  --  1.3*  --  2.0  --   PHOS  --   --   --   --   --  <1.0* <1.0*   Liver Function Tests:  Recent Labs Lab 01/26/15 1118 01/28/15 0600 01/29/15 0552  AST 31 14 13   ALT 17 9 10   ALKPHOS 134* 71 88  BILITOT 0.6 0.2* 0.3  PROT 8.0 3.9* 5.1*  ALBUMIN 2.5* 1.2* 1.8*    Recent Labs Lab 01/26/15 1118  LIPASE 26   No results for input(s): AMMONIA in the last 168 hours. CBC:  Recent Labs Lab 01/26/15 1118 01/27/15 0210 01/28/15 0600 01/29/15 0926  WBC 13.0* 9.8 13.0* 11.4*  NEUTROABS 8.2*  --   --  8.6*  HGB 12.4 9.8* 8.0* 7.7*  HCT 36.7 28.8* 23.0* 22.8*  MCV 80.7 79.8 76.9* 79.4  PLT 460* 349 300 262   Cardiac Enzymes: No results for input(s): CKTOTAL, CKMB, CKMBINDEX, TROPONINI in the last 168 hours. BNP (last 3 results) No results for input(s): BNP in the last 8760 hours.  ProBNP (last 3 results)  Recent Labs  03/20/14 1052  07/08/14 1454  PROBNP 255.5* 210.7*    CBG: No results for input(s): GLUCAP in the last 168 hours.  Recent Results (from the past 240 hour(s))  Urine culture     Status: None   Collection Time: 01/26/15  1:14 PM  Result Value Ref Range Status   Specimen Description URINE, CLEAN CATCH  Final   Special Requests NONE  Final   Colony Count   Final    >=100,000 COLONIES/ML Performed at [facility]    Culture   Final    Multiple bacterial morphotypes present, none predominant. Suggest appropriate recollection if clinically indicated. Performed at [facility]    Report Status 01/27/2015 FINAL  Final  MRSA PCR Screening     Status: Abnormal   Collection Time: 01/27/15  4:28 AM  Result Value Ref Range Status   MRSA by PCR POSITIVE (A) NEGATIVE Final    Comment:        The GeneXpert MRSA Assay (FDA approved for NASAL specimens only), is one component of a comprehensive MRSA colonization surveillance program. It is not intended to diagnose MRSA infection nor to guide or monitor treatment for MRSA infections. RESULT CALLED TO, READ BACK BY AND VERIFIED WITH: Gilman Buttner RN 01/27/15 0856 [place]   Clostridium Difficile by PCR     Status: None   Collection Time: 01/28/15 12:23 AM  Result Value Ref Range Status   C difficile by pcr NEGATIVE NEGATIVE Final     Studies:  Recent x-ray studies have been reviewed in detail by the Attending Physician  Scheduled Meds:  Scheduled Meds: . cefTRIAXone (ROCEPHIN)  IV  1 g Intravenous Q24H  . Chlorhexidine Gluconate Cloth  6 each Topical Q0600  . dicyclomine  10 mg Oral TID  . diphenoxylate-atropine  1 tablet Oral QID  . heparin  5,000 Units Subcutaneous 3 times per day  . metroNIDAZOLE  500 mg Oral 3 times per day  . mupirocin ointment  1 application Nasal BID  . pantoprazole  40 mg Oral BID AC  . potassium chloride  40 mEq Oral TID  . predniSONE  60 mg Oral Q breakfast  . sodium chloride  3 mL Intravenous Q12H     Time spent on care of this patient: 40 mins   Allie Bossier [facility]  [facility] Office  [phone] Pager - [phone]  On-Call/Text Page:      [email or web address]      password [email or web address]  If 7PM-7AM, please contact night-coverage [email or web address] Password [facility] 01/29/2015, 6:57 PM   LOS: 3 days   Care during the described time interval was provided by me .  I have reviewed this patient's available data, including medical history, events of note, physical examination, radiology studies and test results as part of my evaluation  Dia Crawford, MD [phone] Pager

## 2015-01-30 LAB — COMPREHENSIVE METABOLIC PANEL
ALBUMIN: 1.8 g/dL — AB (ref 3.5–5.2)
ALT: 8 U/L (ref 0–35)
AST: 14 U/L (ref 0–37)
Alkaline Phosphatase: 83 U/L (ref 39–117)
Anion gap: 2 — ABNORMAL LOW (ref 5–15)
BUN: 14 mg/dL (ref 6–23)
CALCIUM: 7.2 mg/dL — AB (ref 8.4–10.5)
CHLORIDE: 119 mmol/L — AB (ref 96–112)
CO2: 16 mmol/L — ABNORMAL LOW (ref 19–32)
Creatinine, Ser: 1.08 mg/dL (ref 0.50–1.10)
GFR calc Af Amer: 60 mL/min — ABNORMAL LOW (ref >90)
GFR, EST NON AFRICAN AMERICAN: 52 mL/min — AB (ref >90)
Glucose, Bld: 114 mg/dL — ABNORMAL HIGH (ref 70–99)
Potassium: 4 mmol/L (ref 3.5–5.1)
Sodium: 137 mmol/L (ref 135–145)
TOTAL PROTEIN: 5.2 g/dL — AB (ref 6.0–8.3)
Total Bilirubin: 0.2 mg/dL — ABNORMAL LOW (ref 0.3–1.2)

## 2015-01-30 LAB — CBC WITH DIFFERENTIAL/PLATELET
BASOS PCT: 0 % (ref 0–1)
Basophils Absolute: 0 10*3/uL (ref 0.0–0.1)
Eosinophils Absolute: 0 10*3/uL (ref 0.0–0.7)
Eosinophils Relative: 0 % (ref 0–5)
HCT: 23.3 % — ABNORMAL LOW (ref 36.0–46.0)
Hemoglobin: 7.7 g/dL — ABNORMAL LOW (ref 12.0–15.0)
Lymphocytes Relative: 15 % (ref 12–46)
Lymphs Abs: 1.8 10*3/uL (ref 0.7–4.0)
MCH: 26.6 pg (ref 26.0–34.0)
MCHC: 33 g/dL (ref 30.0–36.0)
MCV: 80.3 fL (ref 78.0–100.0)
Monocytes Absolute: 0.6 10*3/uL (ref 0.1–1.0)
Monocytes Relative: 5 % (ref 3–12)
NEUTROS ABS: 9.8 10*3/uL — AB (ref 1.7–7.7)
NEUTROS PCT: 80 % — AB (ref 43–77)
Platelets: 240 10*3/uL (ref 150–400)
RBC: 2.9 MIL/uL — ABNORMAL LOW (ref 3.87–5.11)
RDW: 17.1 % — AB (ref 11.5–15.5)
WBC: 12.1 10*3/uL — ABNORMAL HIGH (ref 4.0–10.5)

## 2015-01-30 LAB — PHOSPHORUS: PHOSPHORUS: 1.6 mg/dL — AB (ref 2.3–4.6)

## 2015-01-30 LAB — MAGNESIUM: Magnesium: 1.4 mg/dL — ABNORMAL LOW (ref 1.5–2.5)

## 2015-01-30 MED ORDER — MAGNESIUM SULFATE 2 GM/50ML IV SOLN
2.0000 g | Freq: Once | INTRAVENOUS | Status: AC
  Administered 2015-01-30: 2 g via INTRAVENOUS
  Filled 2015-01-30: qty 50

## 2015-01-30 MED ORDER — POTASSIUM CHLORIDE 20 MEQ/15ML (10%) PO SOLN
20.0000 meq | Freq: Three times a day (TID) | ORAL | Status: DC
  Administered 2015-01-30 – 2015-02-02 (×8): 20 meq via ORAL
  Filled 2015-01-30 (×11): qty 15

## 2015-01-30 NOTE — Progress Notes (Signed)
Np made aware that pts BP is currently low with a MAP of 62. Will monitor.

## 2015-01-30 NOTE — Progress Notes (Signed)
[place] TEAM 1 - Stepdown/ICU TEAM Progress Note  Danielle [name] QBH:[id number] DOB: 10/17/1946 DOA: 01/26/2015 PCP: Maggie Font, MD  Admit HPI / Brief Narrative: 69 y.o. female with past medical history of Crohn's disease, chronic diarrhea, and large hiatal hernia with GERD who came to the hospital complaining of nausea, vomiting and diarrhea. Symptoms started about 2 weeks prior but progressed the the point the patient developed generalized weakness. Patient reported she vomits about 3-4 times a day, usually after eating.   In the ED she was found to be severely hypokalemic with potassium of 2.1, severely acidotic with bicarbonate of 10.  Patient also had acute renal failure with creatinine of 2.44.   HPI/Subjective: Feels "much better" today.  Denies cp, n/v, or abdom pain.  States she is ready for OR tomorrow.    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 Per Gen Surg - to OR for exam under anesthesia tomorrow w/ possible [facility] placement   Acute renal failure - resolved creatinine of 2.44 at admit - baseline creatinine 1.0 December 2015 - aldactone / diuretics held - crt has normalized w/ volume resuscitation   Non-gap Metabolic acidosis likely secondary to GI losses from diarrhea and vomiting + acute renal failure - improving w/ normalization of renal fxn - follow w/ ongoing diarrhea   Severe Hypokalemia It appears we have finally repleted her total body deficit - slow  replacement and follow   Hypomagnesemia Corrected but recurring, likely reflective of significant total body deficit - cont to replace and follow   Hyponatremia likely secondary to dehydration - resolved with volume expansion   Hypocalcemia Much improved - cont to replace and follow   Large complex hiatal hernia  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 Followed in [facility] - did not show up for most recent appointment   MRSA screen +  Code Status: FULL Family Communication: no family present at time of exam Disposition Plan: SDU  Consultants: Gen Surg   Procedures: none  Antibiotics: Rocephin 2/20 > Flagyl 2/22 >  DVT prophylaxis: SQ heparin   Objective: Blood pressure 112/61, pulse 100, temperature 97.8 F (36.6 C), temperature source Oral, resp. rate 24, height 5\' 5"  (1.651 m), weight 55.792 kg (123 lb), SpO2 100 %.  Intake/Output Summary (Last 24 hours) at 01/30/15 1733 Last data filed at 01/30/15 0973  Gross per 24 hour  Intake    845 ml  Output    701 ml  Net    144 ml   Exam: General: No acute respiratory distress Lungs: Clear to auscultation bilaterally without wheezes or crackles Cardiovascular: Regular rate and rhythm without murmur gallop or rub Abdomen: Nontender, nondistended, soft, bowel sounds positive, no rebound, no ascites, no appreciable mass Extremities: No significant cyanosis, clubbing, edema bilateral lower extremities  Data Reviewed: Basic Metabolic Panel:  Recent Labs Lab 01/26/15 1405 01/27/15 0210  01/28/15 0600 01/28/15 1800 01/29/15 0552 01/29/15 1600 01/30/15 0500  NA  --  134*  < > 137 129* 136 137 137  K  --  2.7*  < > 2.3* 3.5 4.5 5.0 4.0  CL  --  119*  < > 120* 108 116* 118* 119*  CO2  --  7*  < > 14* 16* 19 14* 16*  GLUCOSE  --  214*  < > 105* 127* 107* 116* 114*  BUN  --  18  < > 12 11 13 12 14   CREATININE  --  1.74*  < > 0.92 1.06 1.07 0.99 1.08  CALCIUM  --  6.8*  < > 4.8* 6.6* 7.0* 6.2* 7.2*  MG  1.4* 3.7*  --  1.3*  --  2.0  --  1.4*  PHOS  --   --   --   --   --  <1.0* <1.0* 1.6*  < > = values in this interval not displayed.  Liver Function Tests:  Recent Labs Lab 01/26/15 1118 01/28/15 0600 01/29/15 0552 01/30/15 0500  AST 31 14 13 14   ALT 17 9 10 8   ALKPHOS 134* 71 88 83  BILITOT 0.6 0.2* 0.3 0.2*  PROT 8.0 3.9* 5.1* 5.2*  ALBUMIN 2.5* 1.2* 1.8* 1.8*    Recent Labs Lab 01/26/15 1118  LIPASE 26   Coags:  Recent Labs Lab 01/26/15 1955  INR 1.21   CBC:  Recent Labs Lab 01/26/15 1118 01/27/15 0210 01/28/15 0600 01/29/15 0926 01/30/15 0500  WBC 13.0* 9.8 13.0* 11.4* 12.1*  NEUTROABS 8.2*  --   --  8.6* 9.8*  HGB 12.4 9.8* 8.0* 7.7* 7.7*  HCT 36.7 28.8* 23.0* 22.8* 23.3*  MCV 80.7 79.8 76.9* 79.4 80.3  PLT 460* 349 300 262 240    Recent Results (from the past 240 hour(s))  Urine culture     Status: None   Collection Time: 01/26/15  1:14 PM  Result Value Ref Range Status   Specimen Description URINE, CLEAN CATCH  Final   Special Requests NONE  Final   Colony Count   Final    >=100,000 COLONIES/ML Performed at [facility]    Culture   Final    Multiple bacterial morphotypes present, none predominant. Suggest appropriate recollection if clinically indicated. Performed at [facility]    Report Status 01/27/2015 FINAL  Final  MRSA PCR Screening     Status: Abnormal   Collection Time: 01/27/15  4:28 AM  Result Value Ref Range Status   MRSA by PCR POSITIVE (A) NEGATIVE Final    Comment:        The GeneXpert MRSA Assay (FDA approved for NASAL specimens only), is one component of a comprehensive MRSA colonization surveillance program. It is not intended to diagnose MRSA infection nor to guide or monitor treatment for MRSA infections. RESULT CALLED TO, READ BACK BY AND VERIFIED WITH: Gilman Buttner RN 01/27/15 0856 [place]   Clostridium Difficile by PCR     Status: None   Collection Time: 01/28/15 12:23 AM  Result Value  Ref Range Status   C difficile by pcr NEGATIVE NEGATIVE Final     Studies:  Recent x-ray studies have been reviewed in detail by the Attending Physician  Scheduled Meds:  Scheduled Meds: . cefTRIAXone (ROCEPHIN)  IV  1 g Intravenous Q24H  . Chlorhexidine Gluconate Cloth  6 each Topical Q0600  . dicyclomine  10 mg Oral TID  . diphenoxylate-atropine  1 tablet Oral QID  . heparin  5,000 Units Subcutaneous 3 times per day  . metroNIDAZOLE  500  mg Oral 3 times per day  . mupirocin ointment  1 application Nasal BID  . pantoprazole  40 mg Oral BID AC  . potassium chloride  40 mEq Oral TID  . predniSONE  60 mg Oral Q breakfast  . sodium chloride  3 mL Intravenous Q12H    Time spent on care of this patient: 35 mins   MCCLUNG,JEFFREY T , MD   [facility] Office  [phone] Pager - Text Page per Shea Evans as per below:  On-Call/Text Page:      [email or web address]      password [email or web address]  If 7PM-7AM, please contact night-coverage [email or web address] Password [facility] 01/30/2015, 5:33 PM   LOS: 4 days

## 2015-01-30 NOTE — Progress Notes (Signed)
Patient ID: Danielle [name], female   DOB: [birth year], 69 y.o.   MRN: [id number]     [facility]      [address] [place], [place] [address]    Phone: [phone] FAX: [phone]     Subjective: Little sore today.  Less diarrhea now that lomotil has been started.  Less vaginal discharge.    Objective:  Vital signs:  Filed Vitals:   01/29/15 1553 01/29/15 2109 01/30/15 0022 01/30/15 0313  BP: 100/52 98/61 94/55  102/62  Pulse: 86 79 72 79  Temp: 97.2 F (36.2 C) 97.6 F (36.4 C) 98.1 F (36.7 C) 97.8 F (36.6 C)  TempSrc: Axillary Oral Oral Oral  Resp: 15 28 15 19   Height:      Weight:      SpO2: 100%  98% 100%    Last BM Date: 01/27/15 (stool coming from vaginal area)  Intake/Output   Yesterday:  02/23 0701 - 02/24 0700 In: 675 [I.V.:675] Out: 700 [Urine:700] This shift: I/O last 3 completed shifts: In: 975 [I.V.:975] Out: 700 [Urine:700]     Physical Exam: General: Pt awake/alert/oriented x4 in no acute distress Abdomen: Soft.  Nondistended.  Nontender.  No evidence of peritonitis.  No incarcerated hernias.   Problem List:   Principal Problem:   UTI (lower urinary tract infection) Active Problems:   Crohn's disease   Hiatal hernia   Metabolic acidosis   ARF (acute renal failure)   Hyponatremia   Hypokalemia   Nausea and vomiting   Vomiting   Nausea with vomiting   Diarrhea   Rectovaginal fistula   Acute renal failure syndrome   Metabolic acidosis, normal anion gap (NAG)   Hypomagnesemia   Hypocalcemia   Hypophosphatemia    Results:   Labs: Results for orders placed or performed during the hospital encounter of 01/26/15 (from the past 48 hour(s))  Basic metabolic panel     Status: Abnormal   Collection Time: 01/28/15  6:00 PM  Result Value Ref Range   Sodium 129 (L) 135 - 145 mmol/L   Potassium 3.5 3.5 - 5.1 mmol/L   Chloride 108 96 - 112 mmol/L   CO2 16 (L) 19 - 32 mmol/L    Glucose, Bld 127 (H) 70 - 99 mg/dL   BUN 11 6 - 23 mg/dL   Creatinine, Ser 1.06 0.50 - 1.10 mg/dL   Calcium 6.6 (L) 8.4 - 10.5 mg/dL   GFR calc non Af Amer 53 (L) >90 mL/min   GFR calc Af Amer 62 (L) >90 mL/min    Comment: (NOTE) The eGFR has been calculated using the CKD EPI equation. This calculation has not been validated in all clinical situations. eGFR's persistently <90 mL/min signify possible Chronic Kidney Disease.    Anion gap 5 5 - 15  Comprehensive metabolic panel     Status: Abnormal   Collection Time: 01/29/15  5:52 AM  Result Value Ref Range   Sodium 136 135 - 145 mmol/L    Comment: DELTA CHECK NOTED   Potassium 4.5 3.5 - 5.1 mmol/L    Comment: NO VISIBLE HEMOLYSIS   Chloride 116 (H) 96 - 112 mmol/L   CO2 19 19 - 32 mmol/L   Glucose, Bld 107 (H) 70 - 99 mg/dL   BUN 13 6 - 23 mg/dL   Creatinine, Ser 1.07 0.50 - 1.10 mg/dL   Calcium 7.0 (L) 8.4 - 10.5 mg/dL   Total Protein 5.1 (L) 6.0 - 8.3  g/dL   Albumin 1.8 (L) 3.5 - 5.2 g/dL   AST 13 0 - 37 U/L   ALT 10 0 - 35 U/L   Alkaline Phosphatase 88 39 - 117 U/L   Total Bilirubin 0.3 0.3 - 1.2 mg/dL   GFR calc non Af Amer 52 (L) >90 mL/min   GFR calc Af Amer 61 (L) >90 mL/min    Comment: (NOTE) The eGFR has been calculated using the CKD EPI equation. This calculation has not been validated in all clinical situations. eGFR's persistently <90 mL/min signify possible Chronic Kidney Disease.    Anion gap 1 (L) 5 - 15    Comment: REPEATED TO VERIFY  Magnesium     Status: None   Collection Time: 01/29/15  5:52 AM  Result Value Ref Range   Magnesium 2.0 1.5 - 2.5 mg/dL  Phosphorus     Status: Abnormal   Collection Time: 01/29/15  5:52 AM  Result Value Ref Range   Phosphorus <1.0 (LL) 2.3 - 4.6 mg/dL    Comment: CRITICAL RESULT CALLED TO, READ BACK BY AND VERIFIED WITH: WHITE C RN 01/29/15 0750 COSTELLO B REPEATED TO VERIFY   CBC with Differential/Platelet     Status: Abnormal   Collection Time: 01/29/15  9:26 AM   Result Value Ref Range   WBC 11.4 (H) 4.0 - 10.5 K/uL   RBC 2.87 (L) 3.87 - 5.11 MIL/uL   Hemoglobin 7.7 (L) 12.0 - 15.0 g/dL   HCT 22.8 (L) 36.0 - 46.0 %   MCV 79.4 78.0 - 100.0 fL   MCH 26.8 26.0 - 34.0 pg   MCHC 33.8 30.0 - 36.0 g/dL   RDW 16.8 (H) 11.5 - 15.5 %   Platelets 262 150 - 400 K/uL   Neutrophils Relative % 76 43 - 77 %   Neutro Abs 8.6 (H) 1.7 - 7.7 K/uL   Lymphocytes Relative 18 12 - 46 %   Lymphs Abs 2.1 0.7 - 4.0 K/uL   Monocytes Relative 6 3 - 12 %   Monocytes Absolute 0.7 0.1 - 1.0 K/uL   Eosinophils Relative 0 0 - 5 %   Eosinophils Absolute 0.0 0.0 - 0.7 K/uL   Basophils Relative 0 0 - 1 %   Basophils Absolute 0.0 0.0 - 0.1 K/uL  Basic metabolic panel     Status: Abnormal   Collection Time: 01/29/15  4:00 PM  Result Value Ref Range   Sodium 137 135 - 145 mmol/L   Potassium 5.0 3.5 - 5.1 mmol/L   Chloride 118 (H) 96 - 112 mmol/L   CO2 14 (L) 19 - 32 mmol/L   Glucose, Bld 116 (H) 70 - 99 mg/dL   BUN 12 6 - 23 mg/dL   Creatinine, Ser 0.99 0.50 - 1.10 mg/dL   Calcium 6.2 (LL) 8.4 - 10.5 mg/dL    Comment: REPEATED TO VERIFY CRITICAL RESULT CALLED TO, READ BACK BY AND VERIFIED WITH: C WHITE,RN 1748 01/29/15 WBOND    GFR calc non Af Amer 58 (L) >90 mL/min   GFR calc Af Amer 67 (L) >90 mL/min    Comment: (NOTE) The eGFR has been calculated using the CKD EPI equation. This calculation has not been validated in all clinical situations. eGFR's persistently <90 mL/min signify possible Chronic Kidney Disease.    Anion gap 5 5 - 15  Phosphorus     Status: Abnormal   Collection Time: 01/29/15  4:00 PM  Result Value Ref Range   Phosphorus <1.0 (LL) 2.3 -  4.6 mg/dL    Comment: REPEATED TO VERIFY CRITICAL RESULT CALLED TO, READ BACK BY AND VERIFIED WITH: C WHITE,RN 1748 01/29/15 WBOND   Comprehensive metabolic panel     Status: Abnormal   Collection Time: 01/30/15  5:00 AM  Result Value Ref Range   Sodium 137 135 - 145 mmol/L   Potassium 4.0 3.5 - 5.1  mmol/L   Chloride 119 (H) 96 - 112 mmol/L   CO2 16 (L) 19 - 32 mmol/L   Glucose, Bld 114 (H) 70 - 99 mg/dL   BUN 14 6 - 23 mg/dL   Creatinine, Ser 1.08 0.50 - 1.10 mg/dL   Calcium 7.2 (L) 8.4 - 10.5 mg/dL   Total Protein 5.2 (L) 6.0 - 8.3 g/dL   Albumin 1.8 (L) 3.5 - 5.2 g/dL   AST 14 0 - 37 U/L   ALT 8 0 - 35 U/L   Alkaline Phosphatase 83 39 - 117 U/L   Total Bilirubin 0.2 (L) 0.3 - 1.2 mg/dL   GFR calc non Af Amer 52 (L) >90 mL/min   GFR calc Af Amer 60 (L) >90 mL/min    Comment: (NOTE) The eGFR has been calculated using the CKD EPI equation. This calculation has not been validated in all clinical situations. eGFR's persistently <90 mL/min signify possible Chronic Kidney Disease.    Anion gap 2 (L) 5 - 15    Comment: REPEATED TO VERIFY  CBC with Differential/Platelet     Status: Abnormal   Collection Time: 01/30/15  5:00 AM  Result Value Ref Range   WBC 12.1 (H) 4.0 - 10.5 K/uL   RBC 2.90 (L) 3.87 - 5.11 MIL/uL   Hemoglobin 7.7 (L) 12.0 - 15.0 g/dL   HCT 23.3 (L) 36.0 - 46.0 %   MCV 80.3 78.0 - 100.0 fL   MCH 26.6 26.0 - 34.0 pg   MCHC 33.0 30.0 - 36.0 g/dL   RDW 17.1 (H) 11.5 - 15.5 %   Platelets 240 150 - 400 K/uL   Neutrophils Relative % 80 (H) 43 - 77 %   Neutro Abs 9.8 (H) 1.7 - 7.7 K/uL   Lymphocytes Relative 15 12 - 46 %   Lymphs Abs 1.8 0.7 - 4.0 K/uL   Monocytes Relative 5 3 - 12 %   Monocytes Absolute 0.6 0.1 - 1.0 K/uL   Eosinophils Relative 0 0 - 5 %   Eosinophils Absolute 0.0 0.0 - 0.7 K/uL   Basophils Relative 0 0 - 1 %   Basophils Absolute 0.0 0.0 - 0.1 K/uL  Magnesium     Status: Abnormal   Collection Time: 01/30/15  5:00 AM  Result Value Ref Range   Magnesium 1.4 (L) 1.5 - 2.5 mg/dL  Phosphorus     Status: Abnormal   Collection Time: 01/30/15  5:00 AM  Result Value Ref Range   Phosphorus 1.6 (L) 2.3 - 4.6 mg/dL    Imaging / Studies: No results found.  Medications / Allergies:  Scheduled Meds: . cefTRIAXone (ROCEPHIN)  IV  1 g Intravenous  Q24H  . Chlorhexidine Gluconate Cloth  6 each Topical Q0600  . dicyclomine  10 mg Oral TID  . diphenoxylate-atropine  1 tablet Oral QID  . heparin  5,000 Units Subcutaneous 3 times per day  . metroNIDAZOLE  500 mg Oral 3 times per day  . mupirocin ointment  1 application Nasal BID  . pantoprazole  40 mg Oral BID AC  . potassium chloride  40 mEq Oral TID  .  predniSONE  60 mg Oral Q breakfast  . sodium chloride  3 mL Intravenous Q12H   Continuous Infusions: . sodium chloride 75 mL/hr at 01/29/15 2136   PRN Meds:.acetaminophen **OR** acetaminophen, HYDROcodone-acetaminophen, morphine injection, promethazine, sodium chloride  Antibiotics: Anti-infectives    Start     Dose/Rate Route Frequency Ordered Stop   01/28/15 1545  metroNIDAZOLE (FLAGYL) tablet 500 mg     500 mg Oral 3 times per day 01/28/15 1520     01/27/15 1500  cefTRIAXone (ROCEPHIN) 1 g in dextrose 5 % 50 mL IVPB     1 g 100 mL/hr over 30 Minutes Intravenous Every 24 hours 01/26/15 1808     01/26/15 1815  cefTRIAXone (ROCEPHIN) 1 g in dextrose 5 % 50 mL IVPB  Status:  Discontinued     1 g 100 mL/hr over 30 Minutes Intravenous Every 24 hours 01/26/15 1803 01/26/15 1808   01/26/15 1400  cefTRIAXone (ROCEPHIN) 1 g in dextrose 5 % 50 mL IVPB     1 g 100 mL/hr over 30 Minutes Intravenous  Once 01/26/15 1357 01/26/15 1537        Assessment/Plan Crohn's disease s/p bowel resection 2006(Dr. 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

## 2015-01-31 ENCOUNTER — Encounter (HOSPITAL_COMMUNITY)
Admission: EM | Disposition: A | Discharge status: Home / Self Care | Payer: Self-pay | Source: Home / Self Care | Attending: Internal Medicine

## 2015-01-31 ENCOUNTER — Inpatient Hospital Stay (HOSPITAL_COMMUNITY): Payer: Medicare Other | Admitting: Anesthesiology

## 2015-01-31 ENCOUNTER — Encounter (HOSPITAL_COMMUNITY): Payer: Self-pay | Admitting: Anesthesiology

## 2015-01-31 DIAGNOSIS — K50113 Crohn's disease of large intestine with fistula: Secondary | ICD-10-CM | POA: Diagnosis present

## 2015-01-31 HISTORY — PX: PROCTOSCOPY: SHX2266

## 2015-01-31 HISTORY — PX: RECTAL EXAM UNDER ANESTHESIA: SHX6399

## 2015-01-31 LAB — COMPREHENSIVE METABOLIC PANEL
ALBUMIN: 1.8 g/dL — AB (ref 3.5–5.2)
ALBUMIN: 2 g/dL — AB (ref 3.5–5.2)
ALT: 10 U/L (ref 0–35)
ALT: 12 U/L (ref 0–35)
ANION GAP: 4 — AB (ref 5–15)
AST: 13 U/L (ref 0–37)
AST: 16 U/L (ref 0–37)
Alkaline Phosphatase: 88 U/L (ref 39–117)
Alkaline Phosphatase: 88 U/L (ref 39–117)
Anion gap: 7 (ref 5–15)
BILIRUBIN TOTAL: 0.3 mg/dL (ref 0.3–1.2)
BUN: 12 mg/dL (ref 6–23)
BUN: 10 mg/dL (ref 6–23)
CALCIUM: 7 mg/dL — AB (ref 8.4–10.5)
CHLORIDE: 116 mmol/L — AB (ref 96–112)
CO2: 18 mmol/L — ABNORMAL LOW (ref 19–32)
CO2: 15 mmol/L — AB (ref 19–32)
CREATININE: 1.04 mg/dL (ref 0.50–1.10)
CREATININE: 1.04 mg/dL (ref 0.50–1.10)
Calcium: 7.1 mg/dL — ABNORMAL LOW (ref 8.4–10.5)
Chloride: 116 mmol/L — ABNORMAL HIGH (ref 96–112)
GFR calc Af Amer: 63 mL/min — ABNORMAL LOW (ref >90)
GFR calc Af Amer: 63 mL/min — ABNORMAL LOW (ref >90)
GFR calc non Af Amer: 54 mL/min — ABNORMAL LOW (ref >90)
GFR calc non Af Amer: 54 mL/min — ABNORMAL LOW (ref >90)
GLUCOSE: 77 mg/dL (ref 70–99)
Glucose, Bld: 114 mg/dL — ABNORMAL HIGH (ref 70–99)
POTASSIUM: 3.4 mmol/L — AB (ref 3.5–5.1)
Potassium: 3.5 mmol/L (ref 3.5–5.1)
SODIUM: 138 mmol/L (ref 135–145)
Sodium: 138 mmol/L (ref 135–145)
TOTAL PROTEIN: 5.3 g/dL — AB (ref 6.0–8.3)
Total Bilirubin: 0.3 mg/dL (ref 0.3–1.2)
Total Protein: 5.9 g/dL — ABNORMAL LOW (ref 6.0–8.3)

## 2015-01-31 LAB — CBC
HCT: 23.6 % — ABNORMAL LOW (ref 36.0–46.0)
Hemoglobin: 7.8 g/dL — ABNORMAL LOW (ref 12.0–15.0)
MCH: 26.7 pg (ref 26.0–34.0)
MCHC: 33.1 g/dL (ref 30.0–36.0)
MCV: 80.8 fL (ref 78.0–100.0)
Platelets: 224 10*3/uL (ref 150–400)
RBC: 2.92 MIL/uL — AB (ref 3.87–5.11)
RDW: 17.2 % — ABNORMAL HIGH (ref 11.5–15.5)
WBC: 15.8 10*3/uL — AB (ref 4.0–10.5)

## 2015-01-31 LAB — MAGNESIUM
MAGNESIUM: 1.7 mg/dL (ref 1.5–2.5)
Magnesium: 1.6 mg/dL (ref 1.5–2.5)

## 2015-01-31 LAB — GLUCOSE, CAPILLARY: Glucose-Capillary: 81 mg/dL (ref 70–99)

## 2015-01-31 LAB — PHOSPHORUS
Phosphorus: 1.3 mg/dL — ABNORMAL LOW (ref 2.3–4.6)
Phosphorus: 1.6 mg/dL — ABNORMAL LOW (ref 2.3–4.6)

## 2015-01-31 SURGERY — PROCTOSCOPY
Anesthesia: General | Site: Rectum

## 2015-01-31 MED ORDER — LACTATED RINGERS IV SOLN
INTRAVENOUS | Status: DC
  Administered 2015-01-31: 11:00:00 via INTRAVENOUS

## 2015-01-31 MED ORDER — LIDOCAINE HCL (CARDIAC) 20 MG/ML IV SOLN
INTRAVENOUS | Status: AC
  Filled 2015-01-31: qty 5

## 2015-01-31 MED ORDER — MIDAZOLAM HCL 5 MG/5ML IJ SOLN
INTRAMUSCULAR | Status: DC | PRN
  Administered 2015-01-31: 0.5 mg via INTRAVENOUS

## 2015-01-31 MED ORDER — LACTATED RINGERS IV SOLN
INTRAVENOUS | Status: DC | PRN
  Administered 2015-01-31: 11:00:00 via INTRAVENOUS

## 2015-01-31 MED ORDER — SUCCINYLCHOLINE CHLORIDE 20 MG/ML IJ SOLN
INTRAMUSCULAR | Status: DC | PRN
  Administered 2015-01-31: 110 mg via INTRAVENOUS

## 2015-01-31 MED ORDER — LIDOCAINE HCL (CARDIAC) 20 MG/ML IV SOLN
INTRAVENOUS | Status: DC | PRN
  Administered 2015-01-31: 80 mg via INTRAVENOUS

## 2015-01-31 MED ORDER — MAGNESIUM SULFATE 2 GM/50ML IV SOLN
2.0000 g | Freq: Once | INTRAVENOUS | Status: AC
  Administered 2015-01-31: 2 g via INTRAVENOUS
  Filled 2015-01-31: qty 50

## 2015-01-31 MED ORDER — MIDAZOLAM HCL 2 MG/2ML IJ SOLN
INTRAMUSCULAR | Status: AC
  Filled 2015-01-31: qty 2

## 2015-01-31 MED ORDER — PROMETHAZINE HCL 25 MG/ML IJ SOLN: 6.2500 mg | INTRAMUSCULAR | Status: DC | PRN

## 2015-01-31 MED ORDER — BUPIVACAINE LIPOSOME 1.3 % IJ SUSP
20.0000 mL | Freq: Once | INTRAMUSCULAR | Status: AC
  Administered 2015-01-31: 20 mL
  Filled 2015-01-31: qty 20

## 2015-01-31 MED ORDER — PROPOFOL 10 MG/ML IV BOLUS
INTRAVENOUS | Status: AC
  Filled 2015-01-31: qty 20

## 2015-01-31 MED ORDER — SODIUM PHOSPHATE 3 MMOLE/ML IV SOLN
30.0000 meq | Freq: Once | INTRAVENOUS | Status: AC
  Administered 2015-01-31: 30 meq via INTRAVENOUS
  Filled 2015-01-31: qty 7.5

## 2015-01-31 MED ORDER — DOUBLE ANTIBIOTIC 500-10000 UNIT/GM EX OINT
TOPICAL_OINTMENT | Freq: Two times a day (BID) | CUTANEOUS | Status: DC
  Administered 2015-01-31 – 2015-02-02 (×5): via TOPICAL
  Filled 2015-01-31 (×4): qty 1
  Filled 2015-01-31: qty 15
  Filled 2015-01-31 (×14): qty 1

## 2015-01-31 MED ORDER — POTASSIUM CHLORIDE 10 MEQ/100ML IV SOLN
10.0000 meq | INTRAVENOUS | Status: AC
  Administered 2015-01-31 – 2015-02-01 (×3): 10 meq via INTRAVENOUS
  Filled 2015-01-31 (×2): qty 100

## 2015-01-31 MED ORDER — ARTIFICIAL TEARS OP OINT
TOPICAL_OINTMENT | OPHTHALMIC | Status: AC
  Filled 2015-01-31: qty 3.5

## 2015-01-31 MED ORDER — FENTANYL CITRATE 0.05 MG/ML IJ SOLN
INTRAMUSCULAR | Status: AC
  Filled 2015-01-31: qty 5

## 2015-01-31 MED ORDER — PHENYLEPHRINE HCL 10 MG/ML IJ SOLN
INTRAMUSCULAR | Status: DC | PRN
  Administered 2015-01-31 (×2): 80 ug via INTRAVENOUS

## 2015-01-31 MED ORDER — ONDANSETRON HCL 4 MG/2ML IJ SOLN
INTRAMUSCULAR | Status: DC | PRN
  Administered 2015-01-31: 4 mg via INTRAVENOUS

## 2015-01-31 MED ORDER — PHENYLEPHRINE 40 MCG/ML (10ML) SYRINGE FOR IV PUSH (FOR BLOOD PRESSURE SUPPORT)
PREFILLED_SYRINGE | INTRAVENOUS | Status: AC
  Filled 2015-01-31: qty 10

## 2015-01-31 MED ORDER — HYDROMORPHONE HCL 1 MG/ML IJ SOLN: 0.2500 mg | INTRAMUSCULAR | Status: DC | PRN

## 2015-01-31 MED ORDER — FENTANYL CITRATE 0.05 MG/ML IJ SOLN
INTRAMUSCULAR | Status: DC | PRN
  Administered 2015-01-31: 100 ug via INTRAVENOUS
  Administered 2015-01-31: 50 ug via INTRAVENOUS

## 2015-01-31 MED ORDER — PROPOFOL 10 MG/ML IV BOLUS
INTRAVENOUS | Status: DC | PRN
  Administered 2015-01-31: 70 mg via INTRAVENOUS

## 2015-01-31 MED ORDER — BACITRACIN-NEOMYCIN-POLYMYXIN OINTMENT TUBE
TOPICAL_OINTMENT | CUTANEOUS | Status: DC | PRN
  Administered 2015-01-31: 1 via TOPICAL

## 2015-01-31 MED ORDER — SUCCINYLCHOLINE CHLORIDE 20 MG/ML IJ SOLN
INTRAMUSCULAR | Status: AC
  Filled 2015-01-31: qty 1

## 2015-01-31 MED ORDER — DIBUCAINE 1 % RE OINT
TOPICAL_OINTMENT | RECTAL | Status: AC
  Filled 2015-01-31: qty 28

## 2015-01-31 MED ORDER — 0.9 % SODIUM CHLORIDE (POUR BTL) OPTIME
TOPICAL | Status: DC | PRN
  Administered 2015-01-31: 1000 mL

## 2015-01-31 MED ORDER — ONDANSETRON HCL 4 MG/2ML IJ SOLN
INTRAMUSCULAR | Status: AC
  Filled 2015-01-31: qty 2

## 2015-01-31 SURGICAL SUPPLY — 50 items
APL SKNCLS STERI-STRIP NONHPOA (GAUZE/BANDAGES/DRESSINGS) ×2
BENZOIN TINCTURE PRP APPL 2/3 (GAUZE/BANDAGES/DRESSINGS) ×4 IMPLANT
CANISTER SUCTION 2500CC (MISCELLANEOUS) ×4 IMPLANT
CLEANER TIP ELECTROSURG 2X2 (MISCELLANEOUS) ×4 IMPLANT
COVER SURGICAL LIGHT HANDLE (MISCELLANEOUS) ×4 IMPLANT
DRAPE LAPAROTOMY T 102X78X121 (DRAPES) ×4 IMPLANT
DRAPE UTILITY XL STRL (DRAPES) ×2 IMPLANT
DRSG PAD ABDOMINAL 8X10 ST (GAUZE/BANDAGES/DRESSINGS) ×1 IMPLANT
ELECT REM PT RETURN 9FT ADLT (ELECTROSURGICAL) ×4
ELECTRODE REM PT RTRN 9FT ADLT (ELECTROSURGICAL) ×2 IMPLANT
GAUZE SPONGE 4X4 12PLY STRL (GAUZE/BANDAGES/DRESSINGS) ×4 IMPLANT
GAUZE SPONGE 4X4 16PLY XRAY LF (GAUZE/BANDAGES/DRESSINGS) ×4 IMPLANT
GLOVE BIO SURGEON STRL SZ 6.5 (GLOVE) ×4 IMPLANT
GLOVE BIO SURGEON STRL SZ7.5 (GLOVE) ×3 IMPLANT
GLOVE BIO SURGEONS STRL SZ 6.5 (GLOVE) ×2
GLOVE BIOGEL PI IND STRL 6.5 (GLOVE) ×1 IMPLANT
GLOVE BIOGEL PI IND STRL 7.0 (GLOVE) ×3 IMPLANT
GLOVE BIOGEL PI IND STRL 8 (GLOVE) ×2 IMPLANT
GLOVE BIOGEL PI INDICATOR 6.5 (GLOVE) ×2
GLOVE BIOGEL PI INDICATOR 7.0 (GLOVE) ×6
GLOVE BIOGEL PI INDICATOR 8 (GLOVE) ×2
GLOVE ECLIPSE 6.5 STRL STRAW (GLOVE) ×3 IMPLANT
GLOVE ECLIPSE 7.5 STRL STRAW (GLOVE) ×4 IMPLANT
GOWN STRL REUS W/ TWL LRG LVL3 (GOWN DISPOSABLE) ×6 IMPLANT
GOWN STRL REUS W/TWL 2XL LVL3 (GOWN DISPOSABLE) ×3 IMPLANT
GOWN STRL REUS W/TWL LRG LVL3 (GOWN DISPOSABLE) ×16
KIT BASIN OR (CUSTOM PROCEDURE TRAY) ×4 IMPLANT
KIT ROOM TURNOVER OR (KITS) ×4 IMPLANT
LEGGING LITHOTOMY PAIR STRL (DRAPES) IMPLANT
LOOP VESSEL MAXI BLUE (MISCELLANEOUS) ×3 IMPLANT
MARKER SKIN DUAL TIP RULER LAB (MISCELLANEOUS) ×4 IMPLANT
NDL HYPO 25GX1X1/2 BEV (NEEDLE) IMPLANT
NEEDLE HYPO 25GX1X1/2 BEV (NEEDLE) ×4 IMPLANT
NS IRRIG 1000ML POUR BTL (IV SOLUTION) ×4 IMPLANT
PACK SURGICAL SETUP 50X90 (CUSTOM PROCEDURE TRAY) ×4 IMPLANT
PAD ABD 8X10 STRL (GAUZE/BANDAGES/DRESSINGS) ×3 IMPLANT
PAD ARMBOARD 7.5X6 YLW CONV (MISCELLANEOUS) ×8 IMPLANT
PENCIL BUTTON HOLSTER BLD 10FT (ELECTRODE) ×4 IMPLANT
SURGILUBE 2OZ TUBE FLIPTOP (MISCELLANEOUS) ×4 IMPLANT
SUT CHROMIC 3 0 SH 27 (SUTURE) ×2 IMPLANT
SUT ETHIBOND NAB CT1 #1 30IN (SUTURE) ×3 IMPLANT
SYR CONTROL 10ML LL (SYRINGE) ×3 IMPLANT
TOWEL OR 17X24 6PK STRL BLUE (TOWEL DISPOSABLE) ×1 IMPLANT
TOWEL OR 17X26 10 PK STRL BLUE (TOWEL DISPOSABLE) ×4 IMPLANT
TRAY PROCTOSCOPIC FIBER OPTIC (SET/KITS/TRAYS/PACK) ×3 IMPLANT
TUBE CONNECTING 12'X1/4 (SUCTIONS) ×1
TUBE CONNECTING 12X1/4 (SUCTIONS) ×3 IMPLANT
UNDERPAD 30X30 INCONTINENT (UNDERPADS AND DIAPERS) ×1 IMPLANT
WATER STERILE IRR 1000ML POUR (IV SOLUTION) ×1 IMPLANT
YANKAUER SUCT BULB TIP NO VENT (SUCTIONS) ×4 IMPLANT

## 2015-01-31 NOTE — Progress Notes (Signed)
For EUA/Fistulotomy/Seton placement today.  Danielle [name]. [name], MD, [place] [phone] [facility]

## 2015-01-31 NOTE — Anesthesia Preprocedure Evaluation (Addendum)
Anesthesia Evaluation  Patient identified by MRN, date of birth, ID band Patient awake    Reviewed: Allergy & Precautions, NPO status , Patient's Chart, lab work & pertinent test results  Airway Mallampati: I  TM Distance: >3 FB Neck ROM: Full    Dental  (+) Dental Advisory Given, Edentulous Upper, Edentulous Lower   Pulmonary former smoker,    Pulmonary exam normal       Cardiovascular + angina  (GERD)   Neuro/Psych    GI/Hepatic Neg liver ROS, hiatal hernia, GERD-  Medicated and Controlled,Crohns disease   Endo/Other  negative endocrine ROS  Renal/GU Renal Insufficiency     Musculoskeletal   Abdominal   Peds  Hematology negative hematology ROS (+) anemia , H?H 7.8/23.6   Anesthesia Other Findings   Reproductive/Obstetrics                           Anesthesia Physical Anesthesia Plan  ASA: III  Anesthesia Plan: General   Post-op Pain Management:    Induction: Intravenous  Airway Management Planned: Oral ETT  Additional Equipment:   Intra-op Plan:   Post-operative Plan: Extubation in OR  Informed Consent: I have reviewed the patients History and Physical, chart, labs and discussed the procedure including the risks, benefits and alternatives for the proposed anesthesia with the patient or authorized representative who has indicated his/her understanding and acceptance.   Dental advisory given  Plan Discussed with: Anesthesiologist and Surgeon  Anesthesia Plan Comments:         Anesthesia Quick Evaluation

## 2015-01-31 NOTE — Anesthesia Postprocedure Evaluation (Signed)
Anesthesia Post Note  Patient: Danielle [name]  Procedure(s) Performed: Procedure(s) (LRB): RIGID PROCTOSCOPY (N/A) RECTAL EXAM UNDER ANESTHESIA WITH PLACEMENT OF A SETON (N/A)  Anesthesia type: MAC  Patient location: PACU  Post pain: Pain level controlled  Post assessment: Patient's Cardiovascular Status Stable  Last Vitals:  Filed Vitals:   01/31/15 1330  BP: 106/60  Pulse:   Temp:   Resp: 13    Post vital signs: Reviewed and stable  Level of consciousness: sedated  Complications: No apparent anesthesia complications

## 2015-01-31 NOTE — Anesthesia Procedure Notes (Signed)
Procedure Name: Intubation Date/Time: 01/31/2015 11:54 AM Performed by: Terrill Mohr Pre-anesthesia Checklist: Patient identified, Emergency Drugs available, Suction available and Patient being monitored Patient Re-evaluated:Patient Re-evaluated prior to inductionOxygen Delivery Method: Circle system utilized Preoxygenation: Pre-oxygenation with 100% oxygen Intubation Type: IV induction Ventilation: Mask ventilation without difficulty Laryngoscope Size: Mac and 3 Grade View: Grade I Tube type: Oral Tube size: 7.0 mm Number of attempts: 1 Airway Equipment and Method: Stylet Placement Confirmation: ETT inserted through vocal cords under direct vision,  breath sounds checked- equal and bilateral and positive ETCO2 Secured at: 21 (cm at gum) cm Tube secured with: Tape Dental Injury: Teeth and Oropharynx as per pre-operative assessment

## 2015-01-31 NOTE — Transfer of Care (Signed)
Immediate Anesthesia Transfer of Care Note  Patient: Danielle [name]  Procedure(s) Performed: Procedure(s): RIGID PROCTOSCOPY (N/A) RECTAL EXAM UNDER ANESTHESIA WITH PLACEMENT OF A SETON (N/A)  Patient Location: PACU  Anesthesia Type:General  Level of Consciousness: awake, alert , oriented and patient cooperative  Airway & Oxygen Therapy: Patient Spontanous Breathing and Patient connected to nasal cannula oxygen  Post-op Assessment: Report given to RN and Post -op Vital signs reviewed and stable  Post vital signs: Reviewed and stable  Last Vitals:  Filed Vitals:   01/31/15 1300  BP:   Pulse:   Temp: 36.4 C  Resp:     Complications: No apparent anesthesia complications

## 2015-01-31 NOTE — Progress Notes (Signed)
Pt has been NPO after midnight and consent signed and in the chart.

## 2015-01-31 NOTE — Progress Notes (Signed)
[facility] TEAM 1 - Stepdown/ICU TEAM Progress Note  Danielle [name] UMP:[id number] DOB: [birth year] DOA: 01/26/2015 PCP: Maggie Font, MD  Admit HPI / Brief Narrative: 69 y.o. BF PMHx  Crohn's disease, chronic diarrhea, and large hiatal hernia, anemia, gout with GERD who came to the hospital complaining of nausea, vomiting and diarrhea. Symptoms started about 2 weeks prior but progressed the the point the patient developed generalized weakness. Patient reported she vomits about 3-4 times a day, usually after eating.   In the ED she was found to be severely hypokalemic with potassium of 2.1, severely acidotic with bicarbonate of 10. Patient also had acute renal failure with creatinine of 2.44.    HPI/Subjective: 2/25  A/O 4, states minimal diarrhea ve diarrhea. Negative N/V/abdominal pain. Negative SOB/CP    Assessment/Plan: UTI -Cont empiric abx  - culture data noted mult bacterial morphotypes - most likely caused by rectovaginal fistula -Continue flagyl to broaden anaerobic coverage   Acute on chronic Nausea, vomiting and diarrhea likely a combination of chronic issues exacerbated by acute UTI/pyelo as well as known severe HH - tx UTI and follow sx - clinically improving   Crohn's disease w fistula/ chronic diarrhea  Remicade and Humira reportedly recently discontinued - placed on prednisone at admit - her GI MD plans to resume Remicade when he sees her in f/u  -Bentyl 10 mg TID (home dose) -Lomotil QID (home dose)  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 Laural Golden who states that a recent MRI noted a rectal area fistula but that at that point it had not yet connected to the vagina - I have placed a call to Gen Surg to discuss tx options  -Consulted general surgery who will evaluate  Acute renal failure creatinine of 2.44 at admit - baseline creatinine 1.0 December 2015 - aldactone / diuretics held - crt  is normalizing w/ volume resuscitation   Non-gap Metabolic acidosis likely secondary to GI losses from diarrhea and vomiting + acute renal failure - bicarb gtt started as bicarb was falling - improving and hypokalemia proving difficult to correct therefore will stop bicarb gtt and follow   Hypokalemia -Potassium 20 mEq  Tid -2/25 potassium IV 10 mEq 3 runs    Hypomagnesemia -Magnesium IV 2 g 1   Hyponatremia -likely secondary to dehydration, resolved with volume expansion   Hypocalcemia -2/25 Albumin = 1.8  - Corrected calcium= 9.08  Hypophosphatemia -Sodium phosphate 30 mEq 1   Large complex hiatal hernia  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   Followed in [facility] - did not show up for most recent appointment  Code Status: FULL Family Communication: no family present at time of exam Disposition Plan: Per surgery    Consultants: Dr. Laural Golden (GI [facility]) phone consult Dr.Alicia Marcello Moores (colorectal surgeon)   Procedure/Significant Events:   2/20 abdominal x-ray;Known large hiatal hernia. Nonspecific air-fluid levels in small/Lg alternating areas of stricturing and dilatation.   Culture 2/20 urine positive multiple bacterial morphotypes 2/21 MRSA by PCR positive 2/22 C. difficile negative    Antibiotics: Rocephin 2/20 > Flagyl 2/22 >  DVT prophylaxis: SQ heparin    Devices NA   LINES / TUBES:  NA    Continuous Infusions: . sodium chloride 50 mL/hr (01/31/15 1549)  . lactated ringers Stopped (01/31/15 1550)    Objective: VITAL SIGNS: Temp: 97.5 F (36.4 C) (02/25 1637) Temp Source: Oral (02/25 1637) BP:  99/54 mmHg (02/25 1637) Pulse Rate: 92 (02/25 1637) SPO2; FIO2:   Intake/Output Summary (Last 24 hours) at 01/31/15 1949 Last data filed at 01/31/15 1800  Gross per 24 hour  Intake   1253 ml  Output     11 ml  Net   1242 ml     Exam: General: A/O 4, NAD, No acute respiratory distress Lungs: Clear to  auscultation bilaterally without wheezes or crackles Cardiovascular: Regular rate and rhythm without murmur gallop or rub normal S1 and S2 Abdomen:Tender to palpation diffusely, nondistended, soft, bowel sounds positive, no rebound, no ascites, no appreciable mass Extremities: No significant cyanosis, clubbing, or edema bilateral lower extremities  Data Reviewed: Basic Metabolic Panel:  Recent Labs Lab 01/28/15 0600  01/29/15 0552 01/29/15 1600 01/30/15 0500 01/31/15 0500 01/31/15 1456  NA 137  < > 136 137 137 138 138  K 2.3*  < > 4.5 5.0 4.0 3.5 3.4*  CL 120*  < > 116* 118* 119* 116* 116*  CO2 14*  < > 19 14* 16* 18* 15*  GLUCOSE 105*  < > 107* 116* 114* 77 114*  BUN 12  < > 13 12 14 12 10   CREATININE 0.92  < > 1.07 0.99 1.08 1.04 1.04  CALCIUM 4.8*  < > 7.0* 6.2* 7.2* 7.0* 7.1*  MG 1.3*  --  2.0  --  1.4* 1.7 1.6  PHOS  --   --  <1.0* <1.0* 1.6* 1.3* 1.6*  < > = values in this interval not displayed. Liver Function Tests:  Recent Labs Lab 01/28/15 0600 01/29/15 0552 01/30/15 0500 01/31/15 0500 01/31/15 1456  AST 14 13 14 13 16   ALT 9 10 8 10 12   ALKPHOS 71 88 83 88 88  BILITOT 0.2* 0.3 0.2* 0.3 0.3  PROT 3.9* 5.1* 5.2* 5.3* 5.9*  ALBUMIN 1.2* 1.8* 1.8* 1.8* 2.0*    Recent Labs Lab 01/26/15 1118  LIPASE 26   No results for input(s): AMMONIA in the last 168 hours. CBC:  Recent Labs Lab 01/26/15 1118 01/27/15 0210 01/28/15 0600 01/29/15 0926 01/30/15 0500 01/31/15 0039  WBC 13.0* 9.8 13.0* 11.4* 12.1* 15.8*  NEUTROABS 8.2*  --   --  8.6* 9.8*  --   HGB 12.4 9.8* 8.0* 7.7* 7.7* 7.8*  HCT 36.7 28.8* 23.0* 22.8* 23.3* 23.6*  MCV 80.7 79.8 76.9* 79.4 80.3 80.8  PLT 460* 349 300 262 240 224   Cardiac Enzymes: No results for input(s): CKTOTAL, CKMB, CKMBINDEX, TROPONINI in the last 168 hours. BNP (last 3 results) No results for input(s): BNP in the last 8760 hours.  ProBNP (last 3 results)  Recent Labs  03/20/14 1052 07/08/14 1454  PROBNP 255.5*  210.7*    CBG:  Recent Labs Lab 01/31/15 1259  GLUCAP 81    Recent Results (from the past 240 hour(s))  Urine culture     Status: None   Collection Time: 01/26/15  1:14 PM  Result Value Ref Range Status   Specimen Description URINE, CLEAN CATCH  Final   Special Requests NONE  Final   Colony Count   Final    >=100,000 COLONIES/ML Performed at [facility]    Culture   Final    Multiple bacterial morphotypes present, none predominant. Suggest appropriate recollection if clinically indicated. Performed at [facility]    Report Status 01/27/2015 FINAL  Final  MRSA PCR Screening     Status: Abnormal   Collection Time: 01/27/15  4:28 AM  Result Value  Ref Range Status   MRSA by PCR POSITIVE (A) NEGATIVE Final    Comment:        The GeneXpert MRSA Assay (FDA approved for NASAL specimens only), is one component of a comprehensive MRSA colonization surveillance program. It is not intended to diagnose MRSA infection nor to guide or monitor treatment for MRSA infections. RESULT CALLED TO, READ BACK BY AND VERIFIED WITH: Gilman Buttner RN 01/27/15 0856 [place]   Clostridium Difficile by PCR     Status: None   Collection Time: 01/28/15 12:23 AM  Result Value Ref Range Status   C difficile by pcr NEGATIVE NEGATIVE Final     Studies:  Recent x-ray studies have been reviewed in detail by the Attending Physician  Scheduled Meds:  Scheduled Meds: . cefTRIAXone (ROCEPHIN)  IV  1 g Intravenous Q24H  . Chlorhexidine Gluconate Cloth  6 each Topical Q0600  . dicyclomine  10 mg Oral TID  . diphenoxylate-atropine  1 tablet Oral QID  . heparin  5,000 Units Subcutaneous 3 times per day  . magnesium sulfate 1 - 4 g bolus IVPB  2 g Intravenous Once  . metroNIDAZOLE  500 mg Oral 3 times per day  . mupirocin ointment  1 application Nasal BID  . pantoprazole  40 mg Oral BID AC  . polymixin-bacitracin   Topical BID  . potassium chloride  10 mEq Intravenous Q1 Hr x 3  .  potassium chloride  20 mEq Oral TID  . predniSONE  60 mg Oral Q breakfast  . sodium chloride  3 mL Intravenous Q12H  . sodium phosphate  Dextrose 5% IVPB  30 mEq Intravenous Once    Time spent on care of this patient: 40 mins   Allie Bossier [facility]  [facility] Office  [phone] Pager - [phone]  On-Call/Text Page:      [email or web address]      password [email or web address]  If 7PM-7AM, please contact night-coverage [email or web address] Password [facility] 01/31/2015, 7:49 PM   LOS: 5 days   Care during the described time interval was provided by me .  I have reviewed this patient's available data, including medical history, events of note, physical examination, radiology studies and test results as part of my evaluation  Dia Crawford, MD [phone] Pager

## 2015-01-31 NOTE — Op Note (Signed)
01/26/2015 - 01/31/2015  80:88 PM  PATIENT:  Danielle [name]  69 y.o. female  Patient Care Team: Iona Beard, MD as PCP - General (Family Medicine) Missy Sabins, MD as Consulting Physician (Gastroenterology) Rogene Houston, MD as Consulting Physician (Gastroenterology) Burke Keels, MD as Consulting Physician (General Surgery)  PRE-OPERATIVE DIAGNOSIS:  Crohn's rectovaginal fistula  POST-OPERATIVE DIAGNOSIS:  Crohn's rectovaginal fistula  PROCEDURE:  EXAM UNDER ANESTHESIA WITH SETON PLACEMENT, RIGID PROCTOSCOPY Surgeon(s): Leighton Ruff, MD Doreen Salvage, MD   ASSISTANT: Dr Hulen Skains   ANESTHESIA:   local and spinal  SPECIMEN:  No Specimen  DISPOSITION OF SPECIMEN:  N/A  COUNTS:  YES  PLAN OF CARE: patient already admitted  PATIENT DISPOSITION:  PACU - hemodynamically stable.  INDICATION: 69 y.o. F with Crohn's disease who presents to the hospital with UTI and concern for rectovaginal fistula.  She has come off her Remicade recently and is on Prednisone here in the hospital.     OR FINDINGS: Distal recto-vaginal fistula with some distal inflammation of the rectum  DESCRIPTION: the patient was identified in the preoperative holding area and taken to the OR where they were laid on the operating room table.  General anesthesia was induced without difficulty. The patient was then positioned in prone jackknife position with buttocks gently taped apart.  The patient was then prepped and draped in usual sterile fashion.  SCDs were noted to be in place prior to the initiation of anesthesia. A surgical timeout was performed indicating the correct patient, procedure, positioning and need for preoperative antibiotics.  I began with a digital rectal exam.  There was moderate stenosis of the distal rectum.  There was some decreased anal tone noted.  A distal rectovaginal fistula was palpated and stool was expressed into the vagina.  A seton was placed through this and secured with Ethibond  sutures x3.  The entire rectum was then visualized with a rigid proctoscope.  This was inserted to ~15cm.  There were no signs of proximal inflammation.  There was some distal inflammation noted at anorectal junction.  This was more pronounced anteriorly.  We then infused a rectal block using Exparel.  The patient was awakened from anesthesia and sent to the PACU in stable condition.  All counts were correct per OR staff.

## 2015-02-01 ENCOUNTER — Encounter (HOSPITAL_COMMUNITY): Payer: Self-pay | Admitting: General Surgery

## 2015-02-01 LAB — CBC WITH DIFFERENTIAL/PLATELET
BASOS ABS: 0 10*3/uL (ref 0.0–0.1)
BASOS PCT: 0 % (ref 0–1)
Eosinophils Absolute: 0 10*3/uL (ref 0.0–0.7)
Eosinophils Relative: 0 % (ref 0–5)
HEMATOCRIT: 23.9 % — AB (ref 36.0–46.0)
HEMOGLOBIN: 7.8 g/dL — AB (ref 12.0–15.0)
Lymphocytes Relative: 22 % (ref 12–46)
Lymphs Abs: 3.9 10*3/uL (ref 0.7–4.0)
MCH: 27.1 pg (ref 26.0–34.0)
MCHC: 32.6 g/dL (ref 30.0–36.0)
MCV: 83 fL (ref 78.0–100.0)
MONO ABS: 1.3 10*3/uL — AB (ref 0.1–1.0)
Monocytes Relative: 7 % (ref 3–12)
Neutro Abs: 12.7 10*3/uL — ABNORMAL HIGH (ref 1.7–7.7)
Neutrophils Relative %: 71 % (ref 43–77)
Platelets: 211 10*3/uL (ref 150–400)
RBC: 2.88 MIL/uL — AB (ref 3.87–5.11)
RDW: 17.6 % — AB (ref 11.5–15.5)
WBC: 18 10*3/uL — AB (ref 4.0–10.5)

## 2015-02-01 LAB — COMPREHENSIVE METABOLIC PANEL
ALT: 10 U/L (ref 0–35)
ANION GAP: 5 (ref 5–15)
AST: 12 U/L (ref 0–37)
Albumin: 1.8 g/dL — ABNORMAL LOW (ref 3.5–5.2)
Alkaline Phosphatase: 78 U/L (ref 39–117)
BUN: 8 mg/dL (ref 6–23)
CALCIUM: 6.9 mg/dL — AB (ref 8.4–10.5)
CO2: 18 mmol/L — AB (ref 19–32)
Chloride: 115 mmol/L — ABNORMAL HIGH (ref 96–112)
Creatinine, Ser: 1.02 mg/dL (ref 0.50–1.10)
GFR calc non Af Amer: 56 mL/min — ABNORMAL LOW (ref >90)
GFR, EST AFRICAN AMERICAN: 64 mL/min — AB (ref >90)
Glucose, Bld: 72 mg/dL (ref 70–99)
POTASSIUM: 3.6 mmol/L (ref 3.5–5.1)
Sodium: 138 mmol/L (ref 135–145)
TOTAL PROTEIN: 5.1 g/dL — AB (ref 6.0–8.3)
Total Bilirubin: 0.2 mg/dL — ABNORMAL LOW (ref 0.3–1.2)

## 2015-02-01 LAB — PHOSPHORUS: PHOSPHORUS: 2.2 mg/dL — AB (ref 2.3–4.6)

## 2015-02-01 LAB — MAGNESIUM: Magnesium: 1.9 mg/dL (ref 1.5–2.5)

## 2015-02-01 MED ORDER — SODIUM BICARBONATE 8.4 % IV SOLN
100.0000 meq | Freq: Once | INTRAVENOUS | Status: AC
  Administered 2015-02-01: 100 meq via INTRAVENOUS
  Filled 2015-02-01: qty 100

## 2015-02-01 NOTE — Care Management Note (Signed)
    Page 1 of 1   02/01/2015     2:08:17 PM CARE MANAGEMENT NOTE 02/01/2015  Patient:  Danielle [name], Danielle [name]   Account Number:  [id number]  Date Initiated:  01/28/2015  Documentation initiated by:  Zarek Relph  Subjective/Objective Assessment:   dx UTI/hyponatremia/hypokalemia; metabolic acidosis; lives with sister    PCP  Iona Beard     Anticipated DC Date:  02/03/2015   Anticipated DC Plan:  [place]  CM consult      Status of service:  In process, will continue to follow Medicare Important Message given?  YES (If response is "NO", the following Medicare IM given date fields will be blank) Date Medicare IM given:  01/29/2015 Medicare IM given by:  Mandy Peeks Date Additional Medicare IM given:  02/01/2015 Additional Medicare IM given by:  Babette Relic  Per UR Regulation:  Reviewed for med. necessity/level of care/duration of stay  Comments:  02/01/15 1030 [place] RN MSN BSN CCM Pt eligible for LTAC, discussed option with pt and family, also discussed ST-SNF for rehab.  Pt states she wants to discuss with MD before making a decision. 61  Pt has decided she does not want either option, wants to return home with home health services.  PT/OT evals pending.

## 2015-02-01 NOTE — Progress Notes (Signed)
Patient ID: Danielle [name], female   DOB: Sep 30, 1946, 69 y.o.   MRN: [id number]     [facility]      [address] [place], [place] [address]    Phone: [phone] FAX: [phone]     Subjective: More drainage.  No pain.    Objective:  Vital signs:  Filed Vitals:   01/31/15 1959 02/01/15 0000 02/01/15 0429 02/01/15 0735  BP: 97/58 106/63 97/64 98/55  Pulse: 89 82 73 79  Temp: 97.2 F (36.2 C) 98.3 F (36.8 C) 97.8 F (36.6 C) 97.8 F (36.6 C)  TempSrc: Axillary Axillary Axillary Oral  Resp: _0 Height:      Weight:      SpO2: 100% 98% 100% 100%    Last BM Date: 01/31/15  Intake/Output   Yesterday:  02/25 0701 - 02/26 0700 In: 803 [I.V.:753; IV Piggyback:50] Out: 211 [Urine:200; Stool:1; Blood:10] This shift:     Physical Exam: General: Pt awake/alert/oriented x4 in no acute distress  Abdomen: Soft.  Nondistended.  Non tender.  No evidence of peritonitis.  No incarcerated hernias.    Problem List:   Principal Problem:   UTI (lower urinary tract infection) Active Problems:   Crohn's disease   Hiatal hernia   Metabolic acidosis   ARF (acute renal failure)   Hyponatremia   Hypokalemia   Nausea and vomiting   Vomiting   Nausea with vomiting   Diarrhea   Rectovaginal fistula   Acute renal failure syndrome   Metabolic acidosis, normal anion gap (NAG)   Hypomagnesemia   Hypocalcemia   Hypophosphatemia   Crohn's disease with fistula    Results:   Labs: Results for orders placed or performed during the hospital encounter of 01/26/15 (from the past 48 hour(s))  CBC     Status: Abnormal   Collection Time: 01/31/15 12:39 AM  Result Value Ref Range   WBC 15.8 (H) 4.0 - 10.5 K/uL   RBC 2.92 (L) 3.87 - 5.11 MIL/uL   Hemoglobin 7.8 (L) 12.0 - 15.0 g/dL   HCT 23.6 (L) 36.0 - 46.0 %   MCV 80.8 78.0 - 100.0 fL   MCH 26.7 26.0 - 34.0 pg   MCHC 33.1 30.0 - 36.0 g/dL   RDW 17.2 (H) 11.5 - 15.5  %   Platelets 224 150 - 400 K/uL  Comprehensive metabolic panel     Status: Abnormal   Collection Time: 01/31/15  5:00 AM  Result Value Ref Range   Sodium 138 135 - 145 mmol/L   Potassium 3.5 3.5 - 5.1 mmol/L   Chloride 116 (H) 96 - 112 mmol/L   CO2 18 (L) 19 - 32 mmol/L   Glucose, Bld 77 70 - 99 mg/dL   BUN 12 6 - 23 mg/dL   Creatinine, Ser 1.04 0.50 - 1.10 mg/dL   Calcium 7.0 (L) 8.4 - 10.5 mg/dL   Total Protein 5.3 (L) 6.0 - 8.3 g/dL   Albumin 1.8 (L) 3.5 - 5.2 g/dL   AST 13 0 - 37 U/L   ALT 10 0 - 35 U/L   Alkaline Phosphatase 88 39 - 117 U/L   Total Bilirubin 0.3 0.3 - 1.2 mg/dL   GFR calc non Af Amer 54 (L) >90 mL/min   GFR calc Af Amer 63 (L) >90 mL/min    Comment: (NOTE) The eGFR has been calculated using the CKD EPI equation. This calculation has not been validated in all  clinical situations. eGFR's persistently <90 mL/min signify possible Chronic Kidney Disease.    Anion gap 4 (L) 5 - 15  Magnesium     Status: None   Collection Time: 01/31/15  5:00 AM  Result Value Ref Range   Magnesium 1.7 1.5 - 2.5 mg/dL  Phosphorus     Status: Abnormal   Collection Time: 01/31/15  5:00 AM  Result Value Ref Range   Phosphorus 1.3 (L) 2.3 - 4.6 mg/dL  Glucose, capillary     Status: None   Collection Time: 01/31/15 12:59 PM  Result Value Ref Range   Glucose-Capillary 81 70 - 99 mg/dL   Comment 1 Notify RN   Comprehensive metabolic panel     Status: Abnormal   Collection Time: 01/31/15  2:56 PM  Result Value Ref Range   Sodium 138 135 - 145 mmol/L   Potassium 3.4 (L) 3.5 - 5.1 mmol/L   Chloride 116 (H) 96 - 112 mmol/L   CO2 15 (L) 19 - 32 mmol/L   Glucose, Bld 114 (H) 70 - 99 mg/dL   BUN 10 6 - 23 mg/dL   Creatinine, Ser 1.04 0.50 - 1.10 mg/dL   Calcium 7.1 (L) 8.4 - 10.5 mg/dL   Total Protein 5.9 (L) 6.0 - 8.3 g/dL   Albumin 2.0 (L) 3.5 - 5.2 g/dL   AST 16 0 - 37 U/L   ALT 12 0 - 35 U/L   Alkaline Phosphatase 88 39 - 117 U/L   Total Bilirubin 0.3 0.3 - 1.2 mg/dL    GFR calc non Af Amer 54 (L) >90 mL/min   GFR calc Af Amer 63 (L) >90 mL/min    Comment: (NOTE) The eGFR has been calculated using the CKD EPI equation. This calculation has not been validated in all clinical situations. eGFR's persistently <90 mL/min signify possible Chronic Kidney Disease.    Anion gap 7 5 - 15  Magnesium     Status: None   Collection Time: 01/31/15  2:56 PM  Result Value Ref Range   Magnesium 1.6 1.5 - 2.5 mg/dL  Phosphorus     Status: Abnormal   Collection Time: 01/31/15  2:56 PM  Result Value Ref Range   Phosphorus 1.6 (L) 2.3 - 4.6 mg/dL  CBC with Differential/Platelet     Status: Abnormal   Collection Time: 02/01/15  5:00 AM  Result Value Ref Range   WBC 18.0 (H) 4.0 - 10.5 K/uL   RBC 2.88 (L) 3.87 - 5.11 MIL/uL   Hemoglobin 7.8 (L) 12.0 - 15.0 g/dL   HCT 23.9 (L) 36.0 - 46.0 %   MCV 83.0 78.0 - 100.0 fL   MCH 27.1 26.0 - 34.0 pg   MCHC 32.6 30.0 - 36.0 g/dL   RDW 17.6 (H) 11.5 - 15.5 %   Platelets 211 150 - 400 K/uL   Neutrophils Relative % 71 43 - 77 %   Neutro Abs 12.7 (H) 1.7 - 7.7 K/uL   Lymphocytes Relative 22 12 - 46 %   Lymphs Abs 3.9 0.7 - 4.0 K/uL   Monocytes Relative 7 3 - 12 %   Monocytes Absolute 1.3 (H) 0.1 - 1.0 K/uL   Eosinophils Relative 0 0 - 5 %   Eosinophils Absolute 0.0 0.0 - 0.7 K/uL   Basophils Relative 0 0 - 1 %   Basophils Absolute 0.0 0.0 - 0.1 K/uL    Imaging / Studies: No results found.  Medications / Allergies:  Scheduled Meds: . cefTRIAXone (ROCEPHIN)    IV  1 g Intravenous Q24H  . Chlorhexidine Gluconate Cloth  6 each Topical Q0600  . dicyclomine  10 mg Oral TID  . diphenoxylate-atropine  1 tablet Oral QID  . heparin  5,000 Units Subcutaneous 3 times per day  . metroNIDAZOLE  500 mg Oral 3 times per day  . mupirocin ointment  1 application Nasal BID  . pantoprazole  40 mg Oral BID AC  . polymixin-bacitracin   Topical BID  . potassium chloride  20 mEq Oral TID  . predniSONE  60 mg Oral Q breakfast  . sodium  chloride  3 mL Intravenous Q12H   Continuous Infusions: . sodium chloride 50 mL/hr (01/31/15 1549)  . lactated ringers Stopped (01/31/15 1550)   PRN Meds:.acetaminophen **OR** acetaminophen, HYDROcodone-acetaminophen, morphine injection, promethazine, sodium chloride  Antibiotics: Anti-infectives    Start     Dose/Rate Route Frequency Ordered Stop   01/28/15 1545  metroNIDAZOLE (FLAGYL) tablet 500 mg     500 mg Oral 3 times per day 01/28/15 1520     01/27/15 1500  cefTRIAXone (ROCEPHIN) 1 g in dextrose 5 % 50 mL IVPB     1 g 100 mL/hr over 30 Minutes Intravenous Every 24 hours 01/26/15 1808     01/26/15 1815  cefTRIAXone (ROCEPHIN) 1 g in dextrose 5 % 50 mL IVPB  Status:  Discontinued     1 g 100 mL/hr over 30 Minutes Intravenous Every 24 hours 01/26/15 1803 01/26/15 1808   01/26/15 1400  cefTRIAXone (ROCEPHIN) 1 g in dextrose 5 % 50 mL IVPB     1 g 100 mL/hr over 30 Minutes Intravenous  Once 01/26/15 1357 01/26/15 1537        Assessment/Plan Rectovaginal fistula POD#1 EUA, placement of seton--Dr. Hulen Skains and Dr. Marcello Moores -i have arranged her follow up with Dr. Joyice Faster -surgery signing off.  please call with questions or concerns.   Erby Pian, [facility] Pager [phone]) For consults and floor pages call [phone](7A-4:30P)  02/01/2015 8:59 AM

## 2015-02-01 NOTE — Progress Notes (Signed)
[place] TEAM 1 - Stepdown/ICU TEAM Progress Note  Danielle [name] GUR:[id number] DOB: 10/17/1946 DOA: 01/26/2015 PCP: Maggie Font, MD  Admit HPI / Brief Narrative: 69 y.o. BF PMHx  Crohn's disease, chronic diarrhea, and large hiatal hernia, anemia, gout with GERD who came to the hospital complaining of nausea, vomiting and diarrhea. Symptoms started about 2 weeks prior but progressed the the point the patient developed generalized weakness. Patient reported she vomits about 3-4 times a day, usually after eating.   In the ED she was found to be severely hypokalemic with potassium of 2.1, severely acidotic with bicarbonate of 10. Patient also had acute renal failure with creatinine of 2.44.    HPI/Subjective: 2/26  A/O 4, negative N/V, negative diarrhea, negative CP, negative SOB. States only pain is minimal were incision in rectal area. States does not wish to go to [facility] or [employer].     Assessment/Plan: UTI -Although patient's leukocytosis may be from steroids considering patient had rectovaginal fistula would continue antibiotic coverage. -Cont empiric abx would finish full 5 day course of antibiotic  - culture data noted mult bacterial morphotypes - most likely caused by rectovaginal fistula -Continue flagyl to broaden anaerobic coverage   Acute on chronic Nausea, vomiting and diarrhea -Resolving  -Will advance diet to regular.  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  -Surgery has signed off, will transfer patient to another ward, continue replacement of electrolytes   Acute renal failure (baseline 1.0 December 2015) -Admission creatinine of 2.44  -Current  creatinine at baseline -Continue gentle hydration normal saline 1ml/hr -Continue to hold nephrotoxic medication  Non-gap Metabolic acidosis -likely secondary to GI losses from diarrhea and vomiting + acute renal failure -Sodium bicarbonate 2 Amps 1   Hypokalemia -Potassium 20 mEq  Tid  Hypomagnesemia -Magnesium IV 2 g 1   Hyponatremia -Resolved   Hypocalcemia -2/25 Albumin = 1.8  - Corrected calcium= 9.08  Hypophosphatemia -Slightly low continue to monitor   Large complex hiatal hernia  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 Laural Golden (GI [facility]) phone consult Dr.Alicia Marcello Moores (colorectal surgeon)   Procedure/Significant Events: 2/25 EXAM UNDER ANESTHESIA WITH SETON PLACEMENT, RIGID PROCTOSCOPY    2/20 abdominal x-ray;Known large hiatal hernia. Nonspecific air-fluid levels in small/Lg alternating areas of stricturing and dilatation.   Culture 2/20 urine positive multiple bacterial morphotypes 2/21 MRSA by PCR positive 2/22 C. difficile negative    Antibiotics: Rocephin 2/20 > Flagyl 2/22 >  DVT prophylaxis: SQ heparin    Devices NA   LINES / TUBES:  NA    Continuous Infusions: . sodium chloride 50 mL/hr at 02/01/15 1245  . lactated ringers Stopped (01/31/15 1550)    Objective: VITAL SIGNS: Temp: 97.8 F (36.6 C) (02/26 0735) Temp Source: Oral (02/26 0735) BP: 103/67 mmHg (02/26 1238) Pulse Rate: 82 (02/26 1238) SPO2; FIO2:   Intake/Output Summary (Last 24 hours) at 02/01/15 1323 Last data filed at 02/01/15 0800  Gross per 24 hour  Intake  250 ml  Output    201 ml  Net     49 ml     Exam: General: A/O 4, NAD, No acute respiratory distress Lungs: Clear to auscultation bilaterally without wheezes or  crackles Cardiovascular: Regular rate and rhythm without murmur gallop or rub normal S1 and S2 Abdomen: Nontender to palpation, soft, bowel sounds positive, no rebound, no ascites, no appreciable mass Extremities: No significant cyanosis, clubbing, or edema bilateral lower extremities  Data Reviewed: Basic Metabolic Panel:  Recent Labs Lab 01/29/15 0552 01/29/15 1600 01/30/15 0500 01/31/15 0500 01/31/15 1456 02/01/15 0500  NA 136 137 137 138 138 138  K 4.5 5.0 4.0 3.5 3.4* 3.6  CL 116* 118* 119* 116* 116* 115*  CO2 19 14* 16* 18* 15* 18*  GLUCOSE 107* 116* 114* 77 114* 72  BUN 13 12 14 12 10 8   CREATININE 1.07 0.99 1.08 1.04 1.04 1.02  CALCIUM 7.0* 6.2* 7.2* 7.0* 7.1* 6.9*  MG 2.0  --  1.4* 1.7 1.6 1.9  PHOS <1.0* <1.0* 1.6* 1.3* 1.6* 2.2*   Liver Function Tests:  Recent Labs Lab 01/29/15 0552 01/30/15 0500 01/31/15 0500 01/31/15 1456 02/01/15 0500  AST 13 14 13 16 12   ALT 10 8 10 12 10   ALKPHOS 88 83 88 88 78  BILITOT 0.3 0.2* 0.3 0.3 0.2*  PROT 5.1* 5.2* 5.3* 5.9* 5.1*  ALBUMIN 1.8* 1.8* 1.8* 2.0* 1.8*    Recent Labs Lab 01/26/15 1118  LIPASE 26   No results for input(s): AMMONIA in the last 168 hours. CBC:  Recent Labs Lab 01/26/15 1118  01/28/15 0600 01/29/15 0926 01/30/15 0500 01/31/15 0039 02/01/15 0500  WBC 13.0*  < > 13.0* 11.4* 12.1* 15.8* 18.0*  NEUTROABS 8.2*  --   --  8.6* 9.8*  --  12.7*  HGB 12.4  < > 8.0* 7.7* 7.7* 7.8* 7.8*  HCT 36.7  < > 23.0* 22.8* 23.3* 23.6* 23.9*  MCV 80.7  < > 76.9* 79.4 80.3 80.8 83.0  PLT 460*  < > 300 262 240 224 211  < > = values in this interval not displayed. Cardiac Enzymes: No results for input(s): CKTOTAL, CKMB, CKMBINDEX, TROPONINI in the last 168 hours. BNP (last 3 results) No results for input(s): BNP in the last 8760 hours.  ProBNP (last 3 results)  Recent Labs  03/20/14 1052 07/08/14 1454  PROBNP 255.5* 210.7*    CBG:  Recent Labs Lab 01/31/15 1259  GLUCAP 81    Recent Results  (from the past 240 hour(s))  Urine culture     Status: None   Collection Time: 01/26/15  1:14 PM  Result Value Ref Range Status   Specimen Description URINE, CLEAN CATCH  Final   Special Requests NONE  Final   Colony Count   Final    >=100,000 COLONIES/ML Performed at [facility]    Culture   Final    Multiple bacterial morphotypes present, none predominant. Suggest appropriate recollection if clinically indicated. Performed at [facility]    Report Status 01/27/2015 FINAL  Final  MRSA PCR Screening     Status: Abnormal   Collection Time: 01/27/15  4:28 AM  Result Value Ref Range Status   MRSA by PCR POSITIVE (A) NEGATIVE Final    Comment:        The GeneXpert MRSA Assay (FDA approved for NASAL specimens only), is one component of a comprehensive MRSA colonization surveillance program. It is not intended to diagnose MRSA infection nor  to guide or monitor treatment for MRSA infections. RESULT CALLED TO, READ BACK BY AND VERIFIED WITH: Gilman Buttner RN 01/27/15 0856 [place]   Clostridium Difficile by PCR     Status: None   Collection Time: 01/28/15 12:23 AM  Result Value Ref Range Status   C difficile by pcr NEGATIVE NEGATIVE Final     Studies:  Recent x-ray studies have been reviewed in detail by the Attending Physician  Scheduled Meds:  Scheduled Meds: . cefTRIAXone (ROCEPHIN)  IV  1 g Intravenous Q24H  . Chlorhexidine Gluconate Cloth  6 each Topical Q0600  . dicyclomine  10 mg Oral TID  . diphenoxylate-atropine  1 tablet Oral QID  . heparin  5,000 Units Subcutaneous 3 times per day  . metroNIDAZOLE  500 mg Oral 3 times per day  . pantoprazole  40 mg Oral BID AC  . polymixin-bacitracin   Topical BID  . potassium chloride  20 mEq Oral TID  . predniSONE  60 mg Oral Q breakfast  . sodium chloride  3 mL Intravenous Q12H    Time spent on care of this patient: 40 mins   Allie Bossier [facility]  [facility] Office  [phone] Pager -  [phone]  On-Call/Text Page:      [email or web address]      password [email or web address]  If 7PM-7AM, please contact night-coverage [email or web address] Password [email or web address] 02/01/2015, 1:23 PM   LOS: 6 days   Care during the described time interval was provided by me .  I have reviewed this patient's available data, including medical history, events of note, physical examination, radiology studies and test results as part of my evaluation  Dia Crawford, MD [phone] Pager

## 2015-02-02 LAB — COMPREHENSIVE METABOLIC PANEL
ALBUMIN: 1.7 g/dL — AB (ref 3.5–5.2)
ALT: 9 U/L (ref 0–35)
ANION GAP: 5 (ref 5–15)
AST: 12 U/L (ref 0–37)
Alkaline Phosphatase: 72 U/L (ref 39–117)
BUN: 7 mg/dL (ref 6–23)
CALCIUM: 6.8 mg/dL — AB (ref 8.4–10.5)
CO2: 20 mmol/L (ref 19–32)
Chloride: 116 mmol/L — ABNORMAL HIGH (ref 96–112)
Creatinine, Ser: 1.01 mg/dL (ref 0.50–1.10)
GFR, EST AFRICAN AMERICAN: 65 mL/min — AB (ref >90)
GFR, EST NON AFRICAN AMERICAN: 56 mL/min — AB (ref >90)
Glucose, Bld: 81 mg/dL (ref 70–99)
Potassium: 3.8 mmol/L (ref 3.5–5.1)
SODIUM: 141 mmol/L (ref 135–145)
TOTAL PROTEIN: 4.9 g/dL — AB (ref 6.0–8.3)
Total Bilirubin: <0.1 mg/dL — ABNORMAL LOW (ref 0.3–1.2)

## 2015-02-02 LAB — CBC WITH DIFFERENTIAL/PLATELET
BASOS PCT: 0 % (ref 0–1)
Basophils Absolute: 0 10*3/uL (ref 0.0–0.1)
EOS ABS: 0 10*3/uL (ref 0.0–0.7)
EOS PCT: 0 % (ref 0–5)
HCT: 22.7 % — ABNORMAL LOW (ref 36.0–46.0)
Hemoglobin: 7.5 g/dL — ABNORMAL LOW (ref 12.0–15.0)
LYMPHS ABS: 2.8 10*3/uL (ref 0.7–4.0)
Lymphocytes Relative: 16 % (ref 12–46)
MCH: 27.7 pg (ref 26.0–34.0)
MCHC: 33 g/dL (ref 30.0–36.0)
MCV: 83.8 fL (ref 78.0–100.0)
MONO ABS: 1.1 10*3/uL — AB (ref 0.1–1.0)
Monocytes Relative: 6 % (ref 3–12)
NEUTROS PCT: 78 % — AB (ref 43–77)
Neutro Abs: 13.1 10*3/uL — ABNORMAL HIGH (ref 1.7–7.7)
PLATELETS: 206 10*3/uL (ref 150–400)
RBC: 2.71 MIL/uL — ABNORMAL LOW (ref 3.87–5.11)
RDW: 17.6 % — AB (ref 11.5–15.5)
WBC: 17 10*3/uL — ABNORMAL HIGH (ref 4.0–10.5)

## 2015-02-02 LAB — MAGNESIUM: MAGNESIUM: 1.4 mg/dL — AB (ref 1.5–2.5)

## 2015-02-02 LAB — PHOSPHORUS: Phosphorus: 1.6 mg/dL — ABNORMAL LOW (ref 2.3–4.6)

## 2015-02-02 MED ORDER — MAGNESIUM SULFATE 2 GM/50ML IV SOLN
2.0000 g | Freq: Once | INTRAVENOUS | Status: AC
  Administered 2015-02-02: 2 g via INTRAVENOUS
  Filled 2015-02-02: qty 50

## 2015-02-02 MED ORDER — SODIUM PHOSPHATE 3 MMOLE/ML IV SOLN
20.0000 meq | Freq: Once | INTRAVENOUS | Status: AC
  Administered 2015-02-02: 20 meq via INTRAVENOUS
  Filled 2015-02-02: qty 5

## 2015-02-02 NOTE — Progress Notes (Signed)
[place] [facility] TEAM 1 - Stepdown/ICU TEAM Progress Note  Danielle [name] VQX:[id number] DOB: 09/20/1946 DOA: 01/26/2015 PCP: Maggie Font, MD  Admit HPI / Brief Narrative: 69 y.o. BF PMHx  Crohn's disease, chronic diarrhea, and large hiatal hernia, anemia, gout with GERD who came to the hospital complaining of nausea, vomiting and diarrhea. Symptoms started about 2 weeks prior but progressed the the point the patient developed generalized weakness. Patient reported she vomits about 3-4 times a day, usually after eating.   In the ED she was found to be severely hypokalemic with potassium of 2.1, severely acidotic with bicarbonate of 10. Patient also had acute renal failure with creatinine of 2.44.    HPI/Subjective: 2/27  A/O 4, negative N/V, negative diarrhea, negative CP, negative SOB.    Assessment/Plan: UTI -Although patient's leukocytosis may be from steroids considering patient had rectovaginal fistula would continue antibiotic coverage. -Cont empiric abx would finish full 5 day course of antibiotic  - culture data noted mult bacterial morphotypes - most likely caused by rectovaginal fistula -Continue flagyl to broaden anaerobic coverage  -Upon discharge in a.m. patient will have completed full course of antibiotics .  Acute on chronic Nausea, vomiting and diarrhea -Resolving  -Tolerating regular diet.  Crohn's disease w fistula/ chronic diarrhea  -Continue prednisone 60 mg daily -Remicade and Humira reportedly recently discontinued; GI MD plans to resume Remicade when he sees her in f/u  -Continue Bentyl 10 mg TID (home dose) -Continue Lomotil QID (home dose)  Rectovaginal fistula -S/P  SETON PLACEMENT by Rennis Chris on 0/03.  -Continue replacement of electrolytes   Acute renal failure (baseline 1.0 December 2015) -Admission creatinine of 2.44  -Current creatinine at baseline -Continue gentle hydration normal saline 77ml/hr -Continue to hold nephrotoxic  medication  Non-gap Metabolic acidosis -likely secondary to GI losses from diarrhea and vomiting + acute renal failure -Resolving   Hypokalemia -Potassium 20 mEq  Tid  Hypomagnesemia -Magnesium IV 2 g 1   Hyponatremia -Resolved   Hypocalcemia -2/25 Albumin = 1.8  - Corrected calcium= 9.08  Hypophosphatemia -Sodium phosphate 20 mEq 1  Large complex hiatal hernia  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 -Followed in [facility] - did not show up for most recent appointment    Code Status: FULL Family Communication: no family present at time of exam Disposition Plan: Discharge in a.m.   Consultants: Dr. Laural Golden (GI [facility]) phone consult Dr.Alicia [facility] (colorectal surgeon)   Procedure/Significant Events: 2/25 EXAM UNDER ANESTHESIA WITH SETON PLACEMENT, RIGID PROCTOSCOPY    2/20 abdominal x-ray;Known large hiatal hernia. Nonspecific air-fluid levels in small/Lg alternating areas of stricturing and dilatation.   Culture 2/20 urine positive multiple bacterial morphotypes 2/21 MRSA by PCR positive 2/22 C. difficile negative    Antibiotics: Rocephin 2/20 > Flagyl 2/22 >  DVT prophylaxis: SQ heparin    Devices NA   LINES / TUBES:  NA    Continuous Infusions: . sodium chloride 50 mL/hr at 02/02/15 0549  . lactated ringers Stopped (01/31/15 1550)    Objective: VITAL SIGNS: Temp: 98 F (36.7 C) (02/27 1413) Temp Source: Oral (02/27 1413) BP: 97/56 mmHg (02/27 1413) Pulse Rate: 93 (02/27 1413) SPO2; FIO2:   Intake/Output Summary (Last 24 hours) at 02/02/15 1747 Last data filed at 02/02/15 1300  Gross per 24 hour  Intake   1533 ml  Output      0 ml  Net   1533 ml     Exam: General: A/O 4, NAD, No acute respiratory  distress Lungs: Clear to auscultation bilaterally without wheezes or crackles Cardiovascular: Regular rate and rhythm without murmur gallop or rub normal S1 and S2 Abdomen: Nontender to palpation, soft, bowel  sounds positive, no rebound, no ascites, no appreciable mass Extremities: No significant cyanosis, clubbing, or edema bilateral lower extremities  Data Reviewed: Basic Metabolic Panel:  Recent Labs Lab 01/30/15 0500 01/31/15 0500 01/31/15 1456 02/01/15 0500 02/02/15 0500  NA 137 138 138 138 141  K 4.0 3.5 3.4* 3.6 3.8  CL 119* 116* 116* 115* 116*  CO2 16* 18* 15* 18* 20  GLUCOSE 114* 77 114* 72 81  BUN 14 12 10 8 7   CREATININE 1.08 1.04 1.04 1.02 1.01  CALCIUM 7.2* 7.0* 7.1* 6.9* 6.8*  MG 1.4* 1.7 1.6 1.9 1.4*  PHOS 1.6* 1.3* 1.6* 2.2* 1.6*   Liver Function Tests:  Recent Labs Lab 01/30/15 0500 01/31/15 0500 01/31/15 1456 02/01/15 0500 02/02/15 0500  AST 14 13 16 12 12   ALT 8 10 12 10 9   ALKPHOS 83 88 88 78 72  BILITOT 0.2* 0.3 0.3 0.2* <0.1*  PROT 5.2* 5.3* 5.9* 5.1* 4.9*  ALBUMIN 1.8* 1.8* 2.0* 1.8* 1.7*   No results for input(s): LIPASE, AMYLASE in the last 168 hours. No results for input(s): AMMONIA in the last 168 hours. CBC:  Recent Labs Lab 01/29/15 0926 01/30/15 0500 01/31/15 0039 02/01/15 0500 02/02/15 0500  WBC 11.4* 12.1* 15.8* 18.0* 17.0*  NEUTROABS 8.6* 9.8*  --  12.7* 13.1*  HGB 7.7* 7.7* 7.8* 7.8* 7.5*  HCT 22.8* 23.3* 23.6* 23.9* 22.7*  MCV 79.4 80.3 80.8 83.0 83.8  PLT 262 240 224 211 206   Cardiac Enzymes: No results for input(s): CKTOTAL, CKMB, CKMBINDEX, TROPONINI in the last 168 hours. BNP (last 3 results) No results for input(s): BNP in the last 8760 hours.  ProBNP (last 3 results)  Recent Labs  03/20/14 1052 07/08/14 1454  PROBNP 255.5* 210.7*    CBG:  Recent Labs Lab 01/31/15 1259  GLUCAP 81    Recent Results (from the past 240 hour(s))  Urine culture     Status: None   Collection Time: 01/26/15  1:14 PM  Result Value Ref Range Status   Specimen Description URINE, CLEAN CATCH  Final   Special Requests NONE  Final   Colony Count   Final    >=100,000 COLONIES/ML Performed at [facility]    Culture    Final    Multiple bacterial morphotypes present, none predominant. Suggest appropriate recollection if clinically indicated. Performed at [facility]    Report Status 01/27/2015 FINAL  Final  MRSA PCR Screening     Status: Abnormal   Collection Time: 01/27/15  4:28 AM  Result Value Ref Range Status   MRSA by PCR POSITIVE (A) NEGATIVE Final    Comment:        The GeneXpert MRSA Assay (FDA approved for NASAL specimens only), is one component of a comprehensive MRSA colonization surveillance program. It is not intended to diagnose MRSA infection nor to guide or monitor treatment for MRSA infections. RESULT CALLED TO, READ BACK BY AND VERIFIED WITH: Gilman Buttner RN 01/27/15 0856 [place]   Clostridium Difficile by PCR     Status: None   Collection Time: 01/28/15 12:23 AM  Result Value Ref Range Status   C difficile by pcr NEGATIVE NEGATIVE Final     Studies:  Recent x-ray studies have been reviewed in detail by the Attending Physician  Scheduled Meds:  Scheduled Meds: .  cefTRIAXone (ROCEPHIN)  IV  1 g Intravenous Q24H  . Chlorhexidine Gluconate Cloth  6 each Topical Q0600  . dicyclomine  10 mg Oral TID  . diphenoxylate-atropine  1 tablet Oral QID  . heparin  5,000 Units Subcutaneous 3 times per day  . metroNIDAZOLE  500 mg Oral 3 times per day  . pantoprazole  40 mg Oral BID AC  . polymixin-bacitracin   Topical BID  . potassium chloride  20 mEq Oral TID  . predniSONE  60 mg Oral Q breakfast  . sodium chloride  3 mL Intravenous Q12H  . sodium phosphate  Dextrose 5% IVPB  20 mEq Intravenous Once    Time spent on care of this patient: 40 mins   Allie Bossier [facility]  [facility] Office  [phone] Pager - [phone]  On-Call/Text Page:      [email or web address]      password [email or web address]  If 7PM-7AM, please contact night-coverage [email or web address] Password [facility] 02/02/2015, 5:47 PM   LOS: 7 days   Care during the described time interval was provided by me .   I have reviewed this patient's available data, including medical history, events of note, physical examination, radiology studies and test results as part of my evaluation  Dia Crawford, MD [phone] Pager

## 2015-02-02 NOTE — Progress Notes (Signed)
Pt a/o, no c/o pain, pt oob with assist x 1 to [facility], pt stable

## 2015-02-03 LAB — CBC WITH DIFFERENTIAL/PLATELET
BASOS ABS: 0 10*3/uL (ref 0.0–0.1)
Basophils Relative: 0 % (ref 0–1)
EOS ABS: 0 10*3/uL (ref 0.0–0.7)
EOS PCT: 0 % (ref 0–5)
HCT: 23.2 % — ABNORMAL LOW (ref 36.0–46.0)
Hemoglobin: 7.8 g/dL — ABNORMAL LOW (ref 12.0–15.0)
LYMPHS PCT: 17 % (ref 12–46)
Lymphs Abs: 3.6 10*3/uL (ref 0.7–4.0)
MCH: 27.7 pg (ref 26.0–34.0)
MCHC: 33.6 g/dL (ref 30.0–36.0)
MCV: 82.3 fL (ref 78.0–100.0)
MONOS PCT: 7 % (ref 3–12)
Monocytes Absolute: 1.4 10*3/uL — ABNORMAL HIGH (ref 0.1–1.0)
NEUTROS ABS: 16.3 10*3/uL — AB (ref 1.7–7.7)
Neutrophils Relative %: 76 % (ref 43–77)
Platelets: 221 10*3/uL (ref 150–400)
RBC: 2.82 MIL/uL — AB (ref 3.87–5.11)
RDW: 17.6 % — AB (ref 11.5–15.5)
WBC: 21.4 10*3/uL — ABNORMAL HIGH (ref 4.0–10.5)

## 2015-02-03 LAB — COMPREHENSIVE METABOLIC PANEL
ALT: 10 U/L (ref 0–35)
AST: 11 U/L (ref 0–37)
Albumin: 1.8 g/dL — ABNORMAL LOW (ref 3.5–5.2)
Alkaline Phosphatase: 82 U/L (ref 39–117)
Anion gap: 5 (ref 5–15)
BUN: 8 mg/dL (ref 6–23)
CHLORIDE: 116 mmol/L — AB (ref 96–112)
CO2: 19 mmol/L (ref 19–32)
Calcium: 6.7 mg/dL — ABNORMAL LOW (ref 8.4–10.5)
Creatinine, Ser: 1 mg/dL (ref 0.50–1.10)
GFR calc Af Amer: 66 mL/min — ABNORMAL LOW (ref >90)
GFR calc non Af Amer: 57 mL/min — ABNORMAL LOW (ref >90)
GLUCOSE: 94 mg/dL (ref 70–99)
Potassium: 4.2 mmol/L (ref 3.5–5.1)
Sodium: 140 mmol/L (ref 135–145)
Total Bilirubin: <0.1 mg/dL — ABNORMAL LOW (ref 0.3–1.2)
Total Protein: 5.1 g/dL — ABNORMAL LOW (ref 6.0–8.3)

## 2015-02-03 LAB — PHOSPHORUS: PHOSPHORUS: 1.8 mg/dL — AB (ref 2.3–4.6)

## 2015-02-03 LAB — MAGNESIUM: MAGNESIUM: 1.6 mg/dL (ref 1.5–2.5)

## 2015-02-03 MED ORDER — MAGNESIUM OXIDE 400 (241.3 MG) MG PO TABS: 200.0000 mg | ORAL_TABLET | Freq: Every day | ORAL | Status: DC

## 2015-02-03 MED ORDER — K PHOS MONO-SOD PHOS DI & MONO 155-852-130 MG PO TABS: 250.0000 mg | ORAL_TABLET | Freq: Every day | ORAL | Status: DC

## 2015-02-03 MED ORDER — POTASSIUM CHLORIDE 20 MEQ/15ML (10%) PO SOLN: 20.0000 meq | Freq: Every day | ORAL | Status: DC

## 2015-02-03 MED ORDER — POTASSIUM CHLORIDE 20 MEQ/15ML (10%) PO SOLN
20.0000 meq | Freq: Every day | ORAL | Status: DC
  Administered 2015-02-03: 20 meq via ORAL
  Filled 2015-02-03: qty 15

## 2015-02-03 MED ORDER — K PHOS MONO-SOD PHOS DI & MONO 155-852-130 MG PO TABS
250.0000 mg | ORAL_TABLET | Freq: Two times a day (BID) | ORAL | Status: DC
  Administered 2015-02-03: 250 mg via ORAL
  Filled 2015-02-03 (×2): qty 1

## 2015-02-03 MED ORDER — PREDNISONE 20 MG PO TABS
60.0000 mg | ORAL_TABLET | Freq: Every day | ORAL | Status: DC
Start: 2015-02-03 — End: 2015-04-25

## 2015-02-03 MED ORDER — MAGNESIUM OXIDE 400 (241.3 MG) MG PO TABS
200.0000 mg | ORAL_TABLET | Freq: Every day | ORAL | Status: DC
  Administered 2015-02-03: 200 mg via ORAL
  Filled 2015-02-03: qty 0.5

## 2015-02-03 NOTE — Progress Notes (Signed)
RUA PICC d/c'ed without incident.  Site cleaned with CHG, covered with vaseline guaze and dry 2x2.  Pressure applied to site.  Pt verbalizes understanding of site care, dressing care and signs of infection via teach back method.  No ADN or signs of infection.

## 2015-02-03 NOTE — Discharge Summary (Signed)
Physician Discharge Summary  Danielle [name] WUX:[id number] DOB: 1946/07/28 DOA: 01/26/2015  PCP: Maggie Font, MD  Admit date: 01/26/2015 Discharge date: 02/03/2015  Time spent: 40 minutes  Recommendations for Outpatient Follow-up:  UTI -culture data noted mult bacterial morphotypes - most likely caused by rectovaginal fistula -Has completed full course of antibiotics .  Acute on chronic Nausea, vomiting and diarrhea -Resolved  -Tolerating regular diet.  Crohn's disease w fistula/ chronic diarrhea  -Continue prednisone 60 mg daily -Patient states is to call Dr. Hildred Laser ([facility]) in [facility], who is going to refer her to a GI physician in [place] to start Remicade  -Continue Bentyl 10 mg TID (home dose) -Continue Lomotil QID (home dose)  Rectovaginal fistula -S/P SETON PLACEMENT by Rennis Chris on 2/53.  -Continue replacement of electrolytes upon discharge. -Follow-up with Rennis Chris (colorectal surgeon) on 9 March at 0910  Acute renal failure (baseline 1.0 December 2015) -Admission creatinine of 2.44  -Current creatinine at baseline -Continue to hold nephrotoxic medication; counseled patient on types of medication to avoid  Non-gap Metabolic acidosis -Resolved  Hypokalemia -Resolved  -Continue potassium chloride 20 mEq daily -Start K-Phos 250 mg daily -PCP and GI physician to monitor  Hypophosphatemia -Start K-Phos 250 mg daily -PCP and GI physician to monitor  Hypomagnesemia -Magnesium oxide 200 mg daily -PCP and GI physician to monitor  Hyponatremia -Resolved   Hypocalcemia -2/25 Albumin = 1.8  - Corrected calcium= 9.08  Large complex hiatal hernia  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 -Followed in [facility] - did not show up for most recent appointment        Discharge Diagnoses:  Principal Problem:   UTI (lower urinary tract infection) Active Problems:   Crohn's disease   Hiatal  hernia   Metabolic acidosis   ARF (acute renal failure)   Hyponatremia   Hypokalemia   Nausea and vomiting   Vomiting   Nausea with vomiting   Diarrhea   Rectovaginal fistula   Acute renal failure syndrome   Metabolic acidosis, normal anion gap (NAG)   Hypomagnesemia   Hypocalcemia   Hypophosphatemia   Crohn's disease with fistula   AKI (acute kidney injury)   Discharge Condition: stable  Diet recommendation: regular  Filed Weights   01/26/15 1739 01/27/15 0400  Weight: 55.475 kg (122 lb 4.8 oz) 55.792 kg (123 lb)    History of present illness:  69 y.o. BF PMHx Crohn's disease, chronic diarrhea, and large hiatal hernia, anemia, gout with GERD who came to the hospital complaining of nausea, vomiting and diarrhea. Symptoms started about 2 weeks prior but progressed the the point the patient developed generalized weakness. Patient reported she vomits about 3-4 times a day, usually after eating.   In the ED she was found to be severely hypokalemic with potassium of 2.1, severely acidotic with bicarbonate of 10. Patient also had acute renal failure with creatinine of 2.44.  During his hospitalization patient was diagnosed with rectovaginal fistula secondary to ongoing Crohn's disease. Patient's fistula was repaired by Rennis Chris (colorectal surgeon). Patient was restarted on steroids which she will remain on until she sees her GI physician, who is going to start her on Remicade. Stable for discharge     Consultants: Dr. Laural Golden ([facility]) phone consult Dr.Alicia [facility] (colorectal surgeon)   Procedure/Significant Events: 2/25 EXAM UNDER ANESTHESIA WITH SETON PLACEMENT, RIGID PROCTOSCOPY 2/20 abdominal x-ray;Known large hiatal hernia. Nonspecific air-fluid levels in small/Lg alternating areas of stricturing and dilatation.  Culture 2/20 urine positive multiple bacterial morphotypes 2/21 MRSA by PCR positive 2/22 C. difficile  negative    Antibiotics: Rocephin 2/20 > Flagyl 2/22 >    Discharge Exam: Filed Vitals:   02/02/15 0636 02/02/15 1413 02/02/15 2122 02/03/15 0529  BP: 97/62 97/56 110/64 99/53  Pulse: 80 93 93 87  Temp: 98.5 F (36.9 C) 98 F (36.7 C) 98.6 F (37 C) 98.4 F (36.9 C)  TempSrc: Oral Oral Oral   Resp: 18 16 16    Height:      Weight:      SpO2: 100%  100% 98%    General: A/O 4, NAD, No acute respiratory distress Lungs: Clear to auscultation bilaterally without wheezes or crackles Cardiovascular: Regular rate and rhythm without murmur gallop or rub normal S1 and S2 Abdomen: Nontender to palpation, soft, bowel sounds positive, no rebound, no ascites, no appreciable mass Extremities: No significant cyanosis, clubbing, or edema bilateral lower extremities   Discharge Instructions     Medication List    STOP taking these medications        metroNIDAZOLE 250 MG tablet  Commonly known as:  FLAGYL     potassium chloride SA 20 MEQ tablet  Commonly known as:  KLOR-CON M20     spironolactone 100 MG tablet  Commonly known as:  ALDACTONE     spironolactone 50 MG tablet  Commonly known as:  ALDACTONE      TAKE these medications        albuterol 108 (90 BASE) MCG/ACT inhaler  Commonly known as:  PROVENTIL HFA;VENTOLIN HFA  Inhale 2 puffs into the lungs every 6 (six) hours as needed for wheezing.     CALCIUM + D PO  Take 1 tablet by mouth daily.     dicyclomine 10 MG/5ML syrup  Commonly known as:  BENTYL  TAKE 2 TEASPOONSFUL 3 TIMES A DAY BEFORE MEALS     diphenhydrAMINE 25 mg capsule  Commonly known as:  BENADRYL  Take 25 mg by mouth every 6 (six) hours as needed for itching.     diphenoxylate-atropine 2.5-0.025 MG per tablet  Commonly known as:  LOMOTIL  Take 1 tablet by mouth 4 (four) times daily as needed for diarrhea or loose stools.     fluocinonide ointment 0.05 %  Commonly known as:  LIDEX  Apply 1 application topically 2 (two) times daily. Apply to  arms, legs, abdomen 2-3 times daily     HYDROcodone-acetaminophen 7.5-325 MG per tablet  Commonly known as:  NORCO  Take 1 tablet by mouth every 6 (six) hours as needed for moderate pain.     magnesium oxide 400 (241.3 MG) MG tablet  Commonly known as:  MAG-OX  Take 0.5 tablets (200 mg total) by mouth daily.     multivitamin with minerals Tabs tablet  Take 1 tablet by mouth daily.     nystatin cream  Commonly known as:  MYCOSTATIN  Apply topically 2 (two) times daily.     nystatin 100000 UNIT/GM Powd  Apply three times a day affected area.     ondansetron 4 MG tablet  Commonly known as:  ZOFRAN  TAKE 1 TABLET (4 MG TOTAL) BY MOUTH 2 (TWO) TIMES DAILY AS NEEDED FOR NAUSEA OR VOMITING.     pantoprazole 40 MG tablet  Commonly known as:  PROTONIX  Take 1 tablet (40 mg total) by mouth 2 (two) times daily before a meal.     phosphorus 155-852-130 MG tablet  Commonly known as:  K PHOS NEUTRAL  Take 1 tablet (250 mg total) by mouth daily.     potassium chloride 20 MEQ/15ML (10%) Soln  Take 15 mLs (20 mEq total) by mouth daily.     predniSONE 20 MG tablet  Commonly known as:  DELTASONE  Take 3 tablets (60 mg total) by mouth daily with breakfast.     promethazine 12.5 MG tablet  Commonly known as:  PHENERGAN  1-2 po q4-6 h prn nausea or vomiting     triamcinolone cream 0.1 %  Commonly known as:  KENALOG  Apply 1 application topically 2 (two) times daily.     VITAMIN B-12 IJ  Inject 1 each as directed every 30 (thirty) days. Around the 1st week of each month       Allergies  Allergen Reactions  . Remicade [Infliximab] Rash    Per Patient - she has developed a rash. Being seen by Dr. Flisher/Dermatology @ [facility].   . Zithromax [Azithromycin] Swelling    Patient states that she has a swollen mouth/ blisters, yeast present mouth and vaginia.  . Aspirin Other (See Comments)    Cramps her stomach  . Ciprofloxacin     unknown  . Sulfa Antibiotics Nausea And Vomiting    Follow-up Information    Follow up with Rosario Adie., MD On 05/15/6788.   Specialty:  General Surgery   Why:  arrive by 910AM for a 9:40AM appointment with colorectal surgeon   Contact information:   [address] [place] [address] [phone]       Follow up with HILL,GERALD K, MD. Schedule an appointment as soon as possible for a visit in 2 weeks.   Specialty:  Family Medicine   Why:  Hospital follow-up Crohn's disease flare, rectal vaginal fistula repair, electrolyte abnormality    Contact information:   [address] [place] [address] [phone]       Follow up with REHMAN,NAJEEB U, MD. Schedule an appointment as soon as possible for a visit in 1 week.   Specialty:  Gastroenterology   Why:  Hospital follow-up Crohn's disease flare complicated by rectovaginal fistula; restart Remicade, monitor electrolyte abnormality   Contact information:   [place] [address] [phone]        The results of significant diagnostics from this hospitalization (including imaging, microbiology, ancillary and laboratory) are listed below for reference.    Significant Diagnostic Studies: Mr Pelvis Wo Contrast  01/08/2015   CLINICAL DATA:  Crohn's disease. No new complaints. Follow-up exam.  EXAM: MRI ABDOMEN AND PELVIS WITHOUT  CONTRAST  TECHNIQUE: Multiplanar multisequence MR imaging of the abdomen and pelvis was performed.  CONTRAST:  No IV contrast  COMPARISON:  CT 07/12/2013  FINDINGS: MRI ABDOMEN FINDINGS  Lower chest:  Poorly imaged  Hepatobiliary: No focal hepatic lesion. No biliary duct dilatation. Post cholecystectomy. The common hepatic duct and common bile duct are mildly dilated following cholecystectomy with the common bile duct measuring 8 mm  Pancreas: Pancreatic parenchyma appears normal without of duct dilatation or inflammation.  Spleen: Normal spleen.  Adrenals/urinary tract: Adrenal glands and kidneys are normal. No renal  obstruction. The bladder appears normal.  Stomach/Bowel: Dynamic imaging of the bowel was performed. There is evidence of multiple surgeries with small bowel resection and enteric colonic anastomoses. There is no evidence of bowel wall thickening or peri colonic or perienteric mesenteric inflammation to suggest active Crohn's disease. No clear evidence of stricturing.  No evidence of fistula or abscess.  Vascular/Lymphatic: All Abdominal aorta is normal caliber. There is no retroperitoneal or periportal lymphadenopathy. No pelvic lymphadenopathy.  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  No free fluid the pelvis.  Rectum appears normal. There is a tubular structure extending from the posterior aspect of the mesorectal fascia and extending anteriorly to involve the posterior wall of the distal rectum/anus. There is a fluid collection extending from 10 o'clock to 4 o'clock surrounding the anus. There is minimal inflammation associated with fluid collection. These findings are similar to CT of 07/12/2013  IMPRESSION: 1. No evidence of active Crohn's disease within the small bowel or colon. 2. No evidence of abscess or fistula involving the small bowel or colon. 3. No evidence of high-grade stricturing. 4. Multiple resections and anastomoses difficult to evaluate on MRI. 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 This appears to be a chronic finding with minimal inflammation and no significant change from CT of 07/12/2013.   Electronically Signed   By: Suzy Bouchard M.D.   On: 01/08/2015 17:35   Mr Abdomen Wo Contrast  01/08/2015   CLINICAL DATA:  Crohn's disease. No new complaints. Follow-up exam.  EXAM: MRI ABDOMEN AND PELVIS WITHOUT  CONTRAST  TECHNIQUE: Multiplanar multisequence MR imaging of the abdomen and pelvis was performed.  CONTRAST:  No IV contrast  COMPARISON:  CT 07/12/2013   FINDINGS: MRI ABDOMEN FINDINGS  Lower chest:  Poorly imaged  Hepatobiliary: No focal hepatic lesion. No biliary duct dilatation. Post cholecystectomy. The common hepatic duct and common bile duct are mildly dilated following cholecystectomy with the common bile duct measuring 8 mm  Pancreas: Pancreatic parenchyma appears normal without of duct dilatation or inflammation.  Spleen: Normal spleen.  Adrenals/urinary tract: Adrenal glands and kidneys are normal. No renal obstruction. The bladder appears normal.  Stomach/Bowel: Dynamic imaging of the bowel was performed. There is evidence of multiple surgeries with small bowel resection and enteric colonic anastomoses. There is no evidence of bowel wall thickening or peri colonic or perienteric mesenteric inflammation to suggest active Crohn's disease. No clear evidence of stricturing. No evidence of fistula or abscess.  Vascular/Lymphatic: All Abdominal aorta is normal caliber. There is no retroperitoneal or periportal lymphadenopathy. No pelvic lymphadenopathy.  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  No free fluid the pelvis.  Rectum appears normal. There is a tubular structure extending from the posterior aspect of the mesorectal fascia and extending anteriorly to involve the posterior wall of the distal rectum/anus. There is a fluid collection extending from 10 o'clock to 4 o'clock surrounding the anus. There is minimal inflammation associated with fluid collection. These findings are similar to CT of 07/12/2013  IMPRESSION: 1. No evidence of active Crohn's disease within the small bowel or colon. 2. No evidence of abscess or fistula involving the small bowel or colon. 3. No evidence of high-grade stricturing. 4. Multiple resections and anastomoses difficult to evaluate on MRI. 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 This appears to be a  chronic finding with minimal inflammation and no significant change from CT of 07/12/2013.   Electronically Signed   By: Suzy Bouchard M.D.   On: 01/08/2015 17:35   Dg Abd 2 Views  01/26/2015   CLINICAL DATA:  Initial evaluation for vomiting and upper abdominal pain for 1 week with mild diarrhea, personal history of Crohn's disease, GERD, and hiatal hernia  EXAM: ABDOMEN - 2 VIEW  COMPARISON:  08/22/2013, 07/12/2013  FINDINGS: Large hiatal hernia with air-fluid levels within it. Left upper quadrant air-fluid levels possibly within stomach or small bowel. Single small right upper quadrant air-fluid level. Air-fluid levels in the mid abdomen inferiorly. No free air in the abdomen identified. Numerous surgical staples identified in the upper and lower abdomen consistent with prior bowel surgery.  When compared to 08/22/2013, the bowel gas pattern is similar. Correlating the appearance with CT scan performed 07/12/2013, the pattern is consistent with known hiatal hernia as well as with the presence of areas of relative small bowel stricture ring and dilatation as seen on prior CT scan.  IMPRESSION: Known large hiatal hernia. Nonspecific air-fluid levels in small and large bowel, similar to prior studies, consistent with known alternating areas of stricturing and dilatation.   Electronically Signed   By: Skipper Cliche M.D.   On: 01/26/2015 14:18    Microbiology: Recent Results (from the past 240 hour(s))  Urine culture     Status: None   Collection Time: 01/26/15  1:14 PM  Result Value Ref Range Status   Specimen Description URINE, CLEAN CATCH  Final   Special Requests NONE  Final   Colony Count   Final    >=100,000 COLONIES/ML Performed at [facility]    Culture   Final    Multiple bacterial morphotypes present, none predominant. Suggest appropriate recollection if clinically indicated. Performed at [facility]    Report Status 01/27/2015 FINAL  Final  MRSA PCR Screening      Status: Abnormal   Collection Time: 01/27/15  4:28 AM  Result Value Ref Range Status   MRSA by PCR POSITIVE (A) NEGATIVE Final    Comment:        The GeneXpert MRSA Assay (FDA approved for NASAL specimens only), is one component of a comprehensive MRSA colonization surveillance program. It is not intended to diagnose MRSA infection nor to guide or monitor treatment for MRSA infections. RESULT CALLED TO, READ BACK BY AND VERIFIED WITH: Gilman Buttner RN 01/27/15 0856 WOOTEN,K   Clostridium Difficile by PCR     Status: None   Collection Time: 01/28/15 12:23 AM  Result Value Ref Range Status   C difficile by pcr NEGATIVE NEGATIVE Final     Labs: Basic Metabolic Panel:  Recent Labs Lab 01/31/15 0500 01/31/15 1456 02/01/15 0500 02/02/15 0500 02/03/15 0520  NA 138 138 138 141 140  K 3.5 3.4* 3.6 3.8 4.2  CL 116* 116* 115* 116* 116*  CO2 18* 15* 18* 20 19  GLUCOSE 77 114* 72 81 94  BUN 12 10 8 7 8   CREATININE 1.04 1.04 1.02 1.01 1.00  CALCIUM 7.0* 7.1* 6.9* 6.8* 6.7*  MG 1.7 1.6 1.9 1.4* 1.6  PHOS 1.3* 1.6* 2.2* 1.6* 1.8*   Liver Function Tests:  Recent Labs Lab 01/31/15 0500 01/31/15 1456 02/01/15 0500 02/02/15 0500 02/03/15 0520  AST 13 16 12 12 11   ALT 10 12 10 9 10   ALKPHOS 88 88 78 72 82  BILITOT 0.3 0.3 0.2* <0.1* <0.1*  PROT 5.3* 5.9* 5.1* 4.9* 5.1*  ALBUMIN 1.8* 2.0* 1.8* 1.7* 1.8*   No results for input(s): LIPASE, AMYLASE in the last 168 hours. No results for input(s): AMMONIA in the last 168 hours. CBC:  Recent Labs Lab 01/29/15 0926 01/30/15  0500 01/31/15 0039 02/01/15 0500 02/02/15 0500 02/03/15 0520  WBC 11.4* 12.1* 15.8* 18.0* 17.0* 21.4*  NEUTROABS 8.6* 9.8*  --  12.7* 13.1* 16.3*  HGB 7.7* 7.7* 7.8* 7.8* 7.5* 7.8*  HCT 22.8* 23.3* 23.6* 23.9* 22.7* 23.2*  MCV 79.4 80.3 80.8 83.0 83.8 82.3  PLT 262 240 224 211 206 221   Cardiac Enzymes: No results for input(s): CKTOTAL, CKMB, CKMBINDEX, TROPONINI in the last 168 hours. BNP: BNP  (last 3 results) No results for input(s): BNP in the last 8760 hours.  ProBNP (last 3 results)  Recent Labs  03/20/14 1052 07/08/14 1454  PROBNP 255.5* 210.7*    CBG:  Recent Labs Lab 01/31/15 1259  GLUCAP 81       Signed:  Dia Crawford, MD [facility] [phone] pager

## 2015-02-11 DIAGNOSIS — S31821A Laceration without foreign body of left buttock, initial encounter: Secondary | ICD-10-CM | POA: Diagnosis not present

## 2015-02-11 DIAGNOSIS — D510 Vitamin B12 deficiency anemia due to intrinsic factor deficiency: Secondary | ICD-10-CM | POA: Diagnosis not present

## 2015-02-11 DIAGNOSIS — K5090 Crohn's disease, unspecified, without complications: Secondary | ICD-10-CM | POA: Diagnosis not present

## 2015-02-11 DIAGNOSIS — N823 Fistula of vagina to large intestine: Secondary | ICD-10-CM | POA: Diagnosis not present

## 2015-02-11 DIAGNOSIS — K50913 Crohn's disease, unspecified, with fistula: Secondary | ICD-10-CM | POA: Diagnosis not present

## 2015-02-11 DIAGNOSIS — D519 Vitamin B12 deficiency anemia, unspecified: Secondary | ICD-10-CM | POA: Diagnosis not present

## 2015-02-15 ENCOUNTER — Other Ambulatory Visit (INDEPENDENT_AMBULATORY_CARE_PROVIDER_SITE_OTHER): Payer: Self-pay | Admitting: Internal Medicine

## 2015-02-18 ENCOUNTER — Encounter (INDEPENDENT_AMBULATORY_CARE_PROVIDER_SITE_OTHER): Payer: Self-pay | Admitting: Internal Medicine

## 2015-02-18 ENCOUNTER — Ambulatory Visit (INDEPENDENT_AMBULATORY_CARE_PROVIDER_SITE_OTHER): Payer: Medicare Other | Admitting: Internal Medicine

## 2015-02-18 VITALS — BP 110/70 | HR 80 | Temp 97.8°F | Resp 18 | Ht 64.5 in | Wt 139.7 lb

## 2015-02-18 DIAGNOSIS — D649 Anemia, unspecified: Secondary | ICD-10-CM

## 2015-02-18 DIAGNOSIS — E876 Hypokalemia: Secondary | ICD-10-CM

## 2015-02-18 DIAGNOSIS — R197 Diarrhea, unspecified: Secondary | ICD-10-CM

## 2015-02-18 DIAGNOSIS — E440 Moderate protein-calorie malnutrition: Secondary | ICD-10-CM | POA: Diagnosis not present

## 2015-02-18 DIAGNOSIS — R21 Rash and other nonspecific skin eruption: Secondary | ICD-10-CM

## 2015-02-18 DIAGNOSIS — R600 Localized edema: Secondary | ICD-10-CM | POA: Diagnosis not present

## 2015-02-18 DIAGNOSIS — R110 Nausea: Secondary | ICD-10-CM | POA: Diagnosis not present

## 2015-02-18 DIAGNOSIS — K50919 Crohn's disease, unspecified, with unspecified complications: Secondary | ICD-10-CM

## 2015-02-18 MED ORDER — DIPHENOXYLATE-ATROPINE 2.5-0.025 MG PO TABS: 1.0000 | ORAL_TABLET | Freq: Four times a day (QID) | ORAL | Status: DC | PRN

## 2015-02-18 MED ORDER — NYSTATIN 100000 UNIT/GM EX CREA: TOPICAL_CREAM | Freq: Two times a day (BID) | CUTANEOUS | Status: DC

## 2015-02-18 MED ORDER — ONDANSETRON HCL 4 MG PO TABS: ORAL_TABLET | ORAL | Status: DC

## 2015-02-18 MED ORDER — SPIRONOLACTONE 50 MG PO TABS: 50.0000 mg | ORAL_TABLET | Freq: Two times a day (BID) | ORAL | Status: DC

## 2015-02-18 MED ORDER — POTASSIUM CHLORIDE 20 MEQ/15ML (10%) PO SOLN: 20.0000 meq | Freq: Two times a day (BID) | ORAL | Status: DC

## 2015-02-18 MED ORDER — NYSTATIN 100000 UNIT/GM EX POWD: CUTANEOUS | Status: DC

## 2015-02-18 MED ORDER — METHOTREXATE 2.5 MG PO TABS: 7.5000 mg | ORAL_TABLET | ORAL | Status: DC

## 2015-02-18 NOTE — Patient Instructions (Addendum)
Please bring all your medications with you at the time of next visit. High-protein low-salt diet.

## 2015-02-18 NOTE — Progress Notes (Signed)
Presenting complaint;  Follow-up for Crohn's disease, anemia and lower extremity edema.  Subjective:  Patient is 69 year old African American female was in for scheduled visit. She was last seen on 12/17/2014. 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 Patient was not having any symptoms pertaining his fistula. She was begun on metronidazole and plans were made for her to go back on Remicade infusion since Humira did not help. Before she could get Remicade she was admitted to [facility] on 01/26/2015 and discharged on 02/03/2015. He was admitted with nausea he even urinary tract infection hypokalemia hypomagnesemia and she began passing air through vagina. She underwent exam under anesthesia by Dr. Leighton Ruff with placement of seton She was also noted to be endemic but was not transfused and her albumin dropped to 1.7. She states skin rash has completely resolved. She is having 3-4 stools per day. Most of her stools are soft or semi-formed. She has minimal vaginal discharge. She denies melena or rectal bleeding. She has noted lower extremity edema which has gotten worse since she was hospitalized. She is taking furosemide at 20 mg daily and Spironolactone and 50 mg daily. She has good appetite.   she needs prescription for nystatin powder Mycolog-II cream as well as ondansetron and diphenoxylate.   she says heartburn is well controlled with therapy. She has had intermittent nausea without vomiting.  She denies fever chills night sweats or dysuria.    Current Medications: Outpatient Encounter Prescriptions as of 02/18/2015  Medication Sig  . albuterol (PROVENTIL HFA;VENTOLIN HFA) 108 (90 BASE) MCG/ACT inhaler Inhale 2 puffs into the lungs every 6 (six) hours as needed for wheezing.  . Calcium Carbonate-Vitamin D (CALCIUM + D PO) Take 1 tablet by mouth daily.  . Cyanocobalamin (VITAMIN B-12 IJ) Inject 1 each as directed every 30  (thirty) days. Around the 1st week of each month  . dicyclomine (BENTYL) 10 MG/5ML syrup TAKE 2 TEASPOONSFUL 3 TIMES A DAY BEFORE MEALS  . diphenhydrAMINE (BENADRYL) 25 mg capsule Take 25 mg by mouth every 6 (six) hours as needed for itching.  . diphenoxylate-atropine (LOMOTIL) 2.5-0.025 MG per tablet Take 1 tablet by mouth 4 (four) times daily as needed for diarrhea or loose stools.  . fluocinonide ointment (LIDEX) 9.51 % Apply 1 application topically 2 (two) times daily. Apply to arms, legs, abdomen 2-3 times daily  . HYDROcodone-acetaminophen (NORCO) 7.5-325 MG per tablet Take 1 tablet by mouth every 6 (six) hours as needed for moderate pain.  . Multiple Vitamin (MULTIVITAMIN WITH MINERALS) TABS Take 1 tablet by mouth daily.  [name] nystatin (MYCOSTATIN/NYSTOP) 100000 UNIT/GM POWD Apply three times a day affected area.  . nystatin cream (MYCOSTATIN) Apply topically 2 (two) times daily.  . ondansetron (ZOFRAN) 4 MG tablet TAKE 1 TABLET (4 MG TOTAL) BY MOUTH 2 (TWO) TIMES DAILY AS NEEDED FOR NAUSEA OR VOMITING.  . pantoprazole (PROTONIX) 40 MG tablet Take 1 tablet (40 mg total) by mouth 2 (two) times daily before a meal.  . potassium chloride 20 MEQ/15ML (10%) SOLN Take 15 mLs (20 mEq total) by mouth daily.  . predniSONE (DELTASONE) 20 MG tablet Take 3 tablets (60 mg total) by mouth daily with breakfast. (Patient taking differently: Take 10 mg by mouth daily with breakfast. )  . triamcinolone cream (KENALOG) 0.1 % Apply 1 application topically 2 (two) times daily.  . [DISCONTINUED] magnesium oxide (MAG-OX) 400 (241.3 MG) MG tablet Take 0.5 tablets (200 mg total) by mouth daily. (  Patient not taking: Reported on 02/18/2015)  . [DISCONTINUED] phosphorus (K PHOS NEUTRAL) 155-852-130 MG tablet Take 1 tablet (250 mg total) by mouth daily. (Patient not taking: Reported on 02/18/2015)  . [DISCONTINUED] promethazine (PHENERGAN) 12.5 MG tablet 1-2 po q4-6 h prn nausea or vomiting (Patient not taking: Reported on  02/18/2015)     Objective: Blood pressure 110/70, pulse 80, temperature 97.8 F (36.6 C), temperature source Oral, resp. rate 18, height 5' 4.5" (1.638 m), weight 139 lb 11.2 oz (63.368 kg). Patient is alert and in no acute distress.  Conjunctiva is pale. Sclera is nonicteric Oropharyngeal mucosa is normal. No neck masses or thyromegaly noted. Cardiac exam with regular rhythm normal S1 and S2. No murmur or gallop noted. Lungs are clear to auscultation. Abdomen is soft and nontender without organomegaly or masses. Examination somewhat limited because she was examined in sitting position  2-3+ pitting edema involving both lower extremities to the level of knees.   Labs/studies Results: Laboratory data from 02/11/2015 from Dr. Cathey Endow office  WBC 13.9, H&H 8.4 and 27.4 platelet count 338K Serum sodium 137 potassium 3.1 chloride 107 CO2 18 BUN 11 creatinine 1.19 glucose 107  Serum calcium 6.3  Serum albumin on 02/03/2015 was 1.8    I also reviewed records of recent hospitalization.  Assessment:  #1. Crohn's disease. 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 She has had I&D for perianal abscesses on few occasions by Dr. Valentino Saxon of [facility]. She has done well with Remicade in the past and she needs to go back on this medication which hopefully will heal this fistula. I have talked with her dermatologist Dr. Clovis Riley of [facility] and she recommended patient be started on methotrexate at 7.5 mg every week and she has no objection to patient taking Remicade (methotrexate primarily for skin disease). #2. Anemia. Hemoglobin was 7.8 two weeks ago and 8.4 last week. Anemia felt to be multifactorial. She does have history of iron deficiency anemia. #3. Malnutrition. Serum albumin is low. She did not have pre-albumin during recent hospitalization but I don't have any doubt that it would be very low.   #4. Perianal rash secondary to topical fungal infection. #5. History of hypokalemia. #6. Nausea. Nausea is multifactorial but primarily due to Crohn's disease and GERD. #7. Bilateral lower extremity edema secondary to hypoalbuminemia and reduced oncotic pressure. If she does not respond to diuretic therapy she may benefit from IV albumin.  Plan:  Proceed with Remicade infusion at 5 mg/kg at weeks 0,2 and 6 weeks for induction and then every 8 weeks for maintenance. Methotrexate 7.5 mg by mouth every weekly. Prescription given for nystatin powder, Mycolog-II cream as well as diphenoxylate and ondansetron. Increase furosemide to 40 mg by mouth daily. KCl 20 mEq by mouth twice a day. Increase Spironolactone to 50 mg by mouth twice a day. High-protein low-salt diet. Patient will have CBC, metabolic 7 and serum albumin in two days at the time of Remicade infusion at day hospital([facility]). Office visit in 4 weeks.

## 2015-02-20 ENCOUNTER — Encounter (HOSPITAL_COMMUNITY): Payer: Self-pay

## 2015-02-20 ENCOUNTER — Encounter (HOSPITAL_COMMUNITY)
Admission: RE | Admit: 2015-02-20 | Discharge: 2015-02-20 | Disposition: A | Discharge status: Home / Self Care | Payer: Medicare Other | Source: Ambulatory Visit | Attending: Internal Medicine | Admitting: Internal Medicine

## 2015-02-20 DIAGNOSIS — K5090 Crohn's disease, unspecified, without complications: Secondary | ICD-10-CM | POA: Diagnosis not present

## 2015-02-20 DIAGNOSIS — R7309 Other abnormal glucose: Secondary | ICD-10-CM | POA: Insufficient documentation

## 2015-02-20 LAB — BASIC METABOLIC PANEL
ANION GAP: 8 (ref 5–15)
BUN: 14 mg/dL (ref 6–23)
CALCIUM: 7.2 mg/dL — AB (ref 8.4–10.5)
CO2: 16 mmol/L — ABNORMAL LOW (ref 19–32)
Chloride: 115 mmol/L — ABNORMAL HIGH (ref 96–112)
Creatinine, Ser: 1.12 mg/dL — ABNORMAL HIGH (ref 0.50–1.10)
GFR calc Af Amer: 58 mL/min — ABNORMAL LOW (ref >90)
GFR, EST NON AFRICAN AMERICAN: 50 mL/min — AB (ref >90)
Glucose, Bld: 40 mg/dL — CL (ref 70–99)
Potassium: 3.5 mmol/L (ref 3.5–5.1)
SODIUM: 139 mmol/L (ref 135–145)

## 2015-02-20 LAB — CBC
HCT: 27.4 % — ABNORMAL LOW (ref 36.0–46.0)
HEMOGLOBIN: 8.5 g/dL — AB (ref 12.0–15.0)
MCH: 28.1 pg (ref 26.0–34.0)
MCHC: 31 g/dL (ref 30.0–36.0)
MCV: 90.4 fL (ref 78.0–100.0)
Platelets: 451 10*3/uL — ABNORMAL HIGH (ref 150–400)
RBC: 3.03 MIL/uL — ABNORMAL LOW (ref 3.87–5.11)
RDW: 17.9 % — ABNORMAL HIGH (ref 11.5–15.5)
WBC: 15.8 10*3/uL — ABNORMAL HIGH (ref 4.0–10.5)

## 2015-02-20 LAB — ALBUMIN: Albumin: 2.7 g/dL — ABNORMAL LOW (ref 3.5–5.2)

## 2015-02-20 LAB — GLUCOSE, CAPILLARY: GLUCOSE-CAPILLARY: 83 mg/dL (ref 70–99)

## 2015-02-20 MED ORDER — ACETAMINOPHEN 325 MG PO TABS
650.0000 mg | ORAL_TABLET | Freq: Once | ORAL | Status: AC
  Administered 2015-02-20: 650 mg via ORAL
  Filled 2015-02-20: qty 2

## 2015-02-20 MED ORDER — SODIUM CHLORIDE 0.9 % IV SOLN
INTRAVENOUS | Status: DC
  Administered 2015-02-20: 250 mL via INTRAVENOUS

## 2015-02-20 MED ORDER — SODIUM CHLORIDE 0.9 % IV SOLN
5.0000 mg/kg | Freq: Once | INTRAVENOUS | Status: AC
  Administered 2015-02-20: 300 mg via INTRAVENOUS
  Filled 2015-02-20: qty 30

## 2015-02-20 MED ORDER — LORATADINE 10 MG PO TABS
10.0000 mg | ORAL_TABLET | Freq: Every day | ORAL | Status: DC
Start: 2015-02-20 — End: 2015-02-21
  Administered 2015-02-20: 10 mg via ORAL
  Filled 2015-02-20: qty 1

## 2015-02-20 NOTE — Progress Notes (Signed)
Results for Danielle [name], Danielle [name] (MRN [id number]) as of 02/20/2015 14:42 Labs drawn prior to remicade infusion, Dr. Laural Golden in to see pt. After receiving glucose of 40  Pt was asymptomic , has eaten a full meal,denies weakness, recheck 86.. Tolerated remicade infusion well. Discussed with Dr. Laural Golden patients severely poor venous access. States will plan to talk with pt.  Ref. Range 02/20/2015 11:25 02/20/2015 14:24  Sodium Latest Range: 135-145 mmol/L 139   Potassium Latest Range: 3.5-5.1 mmol/L 3.5   Chloride Latest Range: 96-112 mmol/L 115 (H)   CO2 Latest Range: 19-32 mmol/L 16 (L)   BUN Latest Range: 6-23 mg/dL 14   Creatinine Latest Range: 0.50-1.10 mg/dL 1.12 (H)   Calcium Latest Range: 8.4-10.5 mg/dL 7.2 (L)   GFR calc non Af Amer Latest Range: >90 mL/min 50 (L)   GFR calc Af Amer Latest Range: >90 mL/min 58 (L)   Glucose Latest Range: 70-99 mg/dL 40 (LL)   Anion gap Latest Range: 5-15  8   Albumin Latest Range: 3.5-5.2 g/dL 2.7 (L)   WBC Latest Range: 4.0-10.5 K/uL 15.8 (H)   RBC Latest Range: 3.87-5.11 MIL/uL 3.03 (L)   Hemoglobin Latest Range: 12.0-15.0 g/dL 8.5 (L)   HCT Latest Range: 36.0-46.0 % 27.4 (L)   MCV Latest Range: 78.0-100.0 fL 90.4   MCH Latest Range: 26.0-34.0 pg 28.1   MCHC Latest Range: 30.0-36.0 g/dL 31.0   RDW Latest Range: 11.5-15.5 % 17.9 (H)   Platelets Latest Range: 150-400 K/uL 451 (H)

## 2015-02-21 ENCOUNTER — Encounter (INDEPENDENT_AMBULATORY_CARE_PROVIDER_SITE_OTHER): Payer: Self-pay | Admitting: *Deleted

## 2015-02-21 ENCOUNTER — Telehealth (INDEPENDENT_AMBULATORY_CARE_PROVIDER_SITE_OTHER): Payer: Self-pay | Admitting: *Deleted

## 2015-02-21 DIAGNOSIS — K50919 Crohn's disease, unspecified, with unspecified complications: Secondary | ICD-10-CM

## 2015-02-21 NOTE — Telephone Encounter (Signed)
Per Dr.Rehman the patient will need to have labs drawn in 2 weeks at the time of her 2 nd Remicade Infusion.

## 2015-02-27 ENCOUNTER — Telehealth (INDEPENDENT_AMBULATORY_CARE_PROVIDER_SITE_OTHER): Payer: Self-pay | Admitting: *Deleted

## 2015-02-27 NOTE — Telephone Encounter (Signed)
[name] would like to speak with [name]. She has a question about a medicine her dermatologist gave her.

## 2015-02-28 NOTE — Telephone Encounter (Signed)
Patient was called again, rec'd voice mail. A message was left asking that she call us before 5 pm today so that we may help her with her question. She was also made aware that we will be closed on Friday for the holiday.

## 2015-02-28 NOTE — Telephone Encounter (Signed)
Patient was called and a message ws left for her to return the call.

## 2015-03-05 ENCOUNTER — Encounter (HOSPITAL_COMMUNITY): Payer: Medicare Other

## 2015-03-06 ENCOUNTER — Encounter (HOSPITAL_COMMUNITY)
Admission: RE | Admit: 2015-03-06 | Discharge: 2015-03-06 | Disposition: A | Discharge status: Home / Self Care | Payer: Medicare Other | Source: Ambulatory Visit | Attending: Internal Medicine | Admitting: Internal Medicine

## 2015-03-06 DIAGNOSIS — K5090 Crohn's disease, unspecified, without complications: Secondary | ICD-10-CM | POA: Diagnosis not present

## 2015-03-06 DIAGNOSIS — R7309 Other abnormal glucose: Secondary | ICD-10-CM | POA: Diagnosis not present

## 2015-03-06 MED ORDER — SODIUM CHLORIDE 0.9 % IV SOLN
300.0000 mg | Freq: Once | INTRAVENOUS | Status: AC
  Administered 2015-03-06: 300 mg via INTRAVENOUS
  Filled 2015-03-06: qty 30

## 2015-03-06 MED ORDER — ACETAMINOPHEN 325 MG PO TABS
ORAL_TABLET | ORAL | Status: AC
  Filled 2015-03-06: qty 2

## 2015-03-06 MED ORDER — LORATADINE 10 MG PO TABS
10.0000 mg | ORAL_TABLET | Freq: Every day | ORAL | Status: DC
  Administered 2015-03-06: 10 mg via ORAL

## 2015-03-06 MED ORDER — LORATADINE 10 MG PO TABS
ORAL_TABLET | ORAL | Status: AC
  Filled 2015-03-06: qty 1

## 2015-03-06 MED ORDER — SODIUM CHLORIDE 0.9 % IV SOLN
INTRAVENOUS | Status: DC
  Administered 2015-03-06: 250 mL via INTRAVENOUS

## 2015-03-06 MED ORDER — ACETAMINOPHEN 325 MG PO TABS
650.0000 mg | ORAL_TABLET | Freq: Once | ORAL | Status: AC
  Administered 2015-03-06: 650 mg via ORAL

## 2015-03-06 NOTE — Progress Notes (Signed)
Here for remicade infusion. Dx K50.90

## 2015-03-07 ENCOUNTER — Telehealth (INDEPENDENT_AMBULATORY_CARE_PROVIDER_SITE_OTHER): Payer: Self-pay | Admitting: *Deleted

## 2015-03-07 NOTE — Telephone Encounter (Signed)
The patient called several days ago asking that I call her. I have made numerous attempts only to get her voicemail. The patient has returned the calls but I am unable to talk with her. I have ask that she leave the name of the medication with Donna,the one Dermatologist gave her, and i would address it with Dr.Rehman.

## 2015-03-07 NOTE — Telephone Encounter (Signed)
A new prescription for the [facility]ate [facility] for 2.5 mg - Take 3 by mouth one time a week #6. The patient has picked up the prescription but accidentally threw it away. Her refills will resume on 03/16/15. Patient was made aware. She also picked up her Folic Acid 1 mg on 09/22/50 and will start taking daily.

## 2015-03-14 ENCOUNTER — Telehealth (INDEPENDENT_AMBULATORY_CARE_PROVIDER_SITE_OTHER): Payer: Self-pay | Admitting: *Deleted

## 2015-03-14 NOTE — Telephone Encounter (Signed)
Called to do PA for the patient's Dicyclomine Suspension 10 mL/60mL. I was told by Pryor Montes that this had been completed and that the Patient's next refill was /is due on 03/08/15. The case number if needed is [id number].

## 2015-03-15 ENCOUNTER — Other Ambulatory Visit (INDEPENDENT_AMBULATORY_CARE_PROVIDER_SITE_OTHER): Payer: Self-pay | Admitting: Internal Medicine

## 2015-03-18 DIAGNOSIS — K5090 Crohn's disease, unspecified, without complications: Secondary | ICD-10-CM | POA: Diagnosis not present

## 2015-03-18 DIAGNOSIS — N823 Fistula of vagina to large intestine: Secondary | ICD-10-CM | POA: Diagnosis not present

## 2015-03-18 DIAGNOSIS — G4700 Insomnia, unspecified: Secondary | ICD-10-CM | POA: Diagnosis not present

## 2015-03-21 ENCOUNTER — Other Ambulatory Visit (INDEPENDENT_AMBULATORY_CARE_PROVIDER_SITE_OTHER): Payer: Self-pay | Admitting: Internal Medicine

## 2015-03-21 ENCOUNTER — Ambulatory Visit (INDEPENDENT_AMBULATORY_CARE_PROVIDER_SITE_OTHER): Payer: Medicare Other | Admitting: Internal Medicine

## 2015-03-21 ENCOUNTER — Encounter (INDEPENDENT_AMBULATORY_CARE_PROVIDER_SITE_OTHER): Payer: Self-pay

## 2015-03-25 ENCOUNTER — Encounter (INDEPENDENT_AMBULATORY_CARE_PROVIDER_SITE_OTHER): Payer: Self-pay | Admitting: Internal Medicine

## 2015-03-25 ENCOUNTER — Telehealth (INDEPENDENT_AMBULATORY_CARE_PROVIDER_SITE_OTHER): Payer: Self-pay | Admitting: Internal Medicine

## 2015-03-25 ENCOUNTER — Ambulatory Visit (INDEPENDENT_AMBULATORY_CARE_PROVIDER_SITE_OTHER): Payer: Medicare Other | Admitting: Internal Medicine

## 2015-03-25 VITALS — BP 122/60 | HR 80 | Temp 97.0°F | Ht 64.5 in | Wt 138.0 lb

## 2015-03-25 DIAGNOSIS — K50113 Crohn's disease of large intestine with fistula: Secondary | ICD-10-CM

## 2015-03-25 NOTE — Telephone Encounter (Signed)
error 

## 2015-03-25 NOTE — Patient Instructions (Signed)
OV in 3 months with a CBC

## 2015-03-25 NOTE — Progress Notes (Signed)
Subjective:    Patient ID: Danielle [name], female    DOB: 1946/08/09, 69 y.o.   MRN: [id number]  HPI 69 yr old black female here for f/u. Last seen in March for Crohn's and anemia. On 01/08/2015 MRI abdomen/pelvis with CM obtained to reasess staging for her disease which revealed a rectovaginal fistula.  She started Remicade infusion last month.  n.  She tells me she feels tired all the time. There is no rash. She was admitted [facility] in February for complications of a rectovaginal fistula. She was admitted with nausea, UTI, hypokalemia, hypomagnesemia and she was passing air thru her vagina. On 01/31/2015 she underwent a rigid proctoscopy with seton placement by Chauncey Mann MD  Appetite is great. Presently taking 10mg  of Prednisone. Hx of Crohns greater than 20 yrs. She denies any nausea. Presently taking Prednisone 10mg  daily. She says she has a small amt of vaginal discharge. No rectal bleeding.  She has a BM x 5 a day and are very loose.   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  Subjective:  CBC    Component Value Date/Time   WBC 15.8* 02/20/2015 1125   RBC 3.03* 02/20/2015 1125   HGB 8.5* 02/20/2015 1125   HCT 27.4* 02/20/2015 1125   PLT 451* 02/20/2015 1125   MCV 90.4 02/20/2015 1125   MCH 28.1 02/20/2015 1125   MCHC 31.0 02/20/2015 1125   RDW 17.9* 02/20/2015 1125   LYMPHSABS 3.6 02/03/2015 0520   MONOABS 1.4* 02/03/2015 0520   EOSABS 0.0 02/03/2015 0520   BASOSABS 0.0 02/03/2015 0520    Review of Systems Past Medical History  Diagnosis Date  . Crohn's disease   . Chronic diarrhea   . GERD (gastroesophageal reflux disease)   . Polyarthralgia   . Hiatal hernia   . Hemorrhoids   . Anemia   . Hiatal hernia     hiatal  . Pneumonia 2014  . Gout   . Psoriasis     Past Surgical History  Procedure Laterality Date  . Bowel resection  2009  . 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 Anal stenosis  ~ 2010  . Colonoscopy  2013    . Cholecystectomy  1990's  . Appendectomy  1980's  . Goiter  1999  . Esophagogastroduodenoscopy  11/11/2011    Procedure: ESOPHAGOGASTRODUODENOSCOPY (EGD);  Surgeon: Missy Sabins, MD;  Location: [facility] ENDOSCOPY;  Service: Endoscopy;  Laterality: N/A;  . Flexible sigmoidoscopy  11/11/2011    Procedure: FLEXIBLE SIGMOIDOSCOPY;  Surgeon: Missy Sabins, MD;  Location: [place];  Service: Endoscopy;  Laterality: N/A;  . Esophagogastroduodenoscopy  05/18/2012    Procedure: ESOPHAGOGASTRODUODENOSCOPY (EGD);  Surgeon: Rogene Houston, MD;  Location: [facility] ENDO SUITE;  Service: Endoscopy;  Laterality: N/A;  200  . Colostomy takedown  2009    "only had it for about 1 month" (08/22/2013)  . Left heart catheterization with coronary angiogram N/A 07/09/2014    Procedure: LEFT HEART CATHETERIZATION WITH CORONARY ANGIOGRAM;  Surgeon: Peter M Martinique, MD;  Location[facility] CATH LAB;  Service: Cardiovascular;  Laterality: N/A;  . Proctoscopy N/A 01/31/2015    Procedure: RIGID PROCTOSCOPY;  Surgeon: Leighton Ruff, MD;  Location: [place];  Service: General;  Laterality: N/A;  . Rectal exam under anesthesia N/A 01/31/2015    Procedure: [place] OF A SETON;  Surgeon: Leighton Ruff, MD;  Location: [place];  Service: General;  Laterality: N/A;  Allergies  Allergen Reactions  . Zithromax [Azithromycin] Swelling    Patient states that she has a swollen mouth/ blisters, yeast present mouth and vaginia.  . Aspirin Other (See Comments)    Cramps her stomach  . Ciprofloxacin     unknown  . Sulfa Antibiotics Nausea And Vomiting    Current Outpatient Prescriptions on File Prior to Visit  Medication Sig Dispense Refill  . albuterol (PROVENTIL HFA;VENTOLIN HFA) 108 (90 BASE) MCG/ACT inhaler Inhale 2 puffs into the lungs every 6 (six) hours as needed for wheezing. 1 Inhaler 2  . Calcium Carbonate-Vitamin D (CALCIUM + D PO) Take 1 tablet by mouth daily.    . Cyanocobalamin (VITAMIN B-12 IJ) Inject  1 each as directed every 30 (thirty) days. Around the 1st week of each month    . dicyclomine (BENTYL) 10 MG/5ML syrup TAKE 2 TEASPOONSFUL 3 TIMES A DAY BEFORE MEALS 300 mL 5  . diphenhydrAMINE (BENADRYL) 25 mg capsule Take 25 mg by mouth every 6 (six) hours as needed for itching.    . diphenoxylate-atropine (LOMOTIL) 2.5-0.025 MG per tablet Take 1 tablet by mouth 4 (four) times daily as needed for diarrhea or loose stools. 120 tablet 2  . fluocinonide ointment (LIDEX) 9.62 % Apply 1 application topically 2 (two) times daily. Apply to arms, legs, abdomen 2-3 times daily    . HYDROcodone-acetaminophen (NORCO) 7.5-325 MG per tablet Take 1 tablet by mouth every 6 (six) hours as needed for moderate pain.    . methotrexate (RHEUMATREX) 2.5 MG tablet Take 3 tablets (7.5 mg total) by mouth once a week. Caution:Chemotherapy. Protect from light. 12 tablet 5  . Multiple Vitamin (MULTIVITAMIN WITH MINERALS) TABS Take 1 tablet by mouth daily.    [name] nystatin (MYCOSTATIN/NYSTOP) 100000 UNIT/GM POWD Apply three times a day affected area. 60 g 1  . nystatin cream (MYCOSTATIN) Apply topically 2 (two) times daily. 30 g 0  . ondansetron (ZOFRAN) 4 MG tablet TAKE 1 TABLET (4 MG TOTAL) BY MOUTH 2 (TWO) TIMES DAILY AS NEEDED FOR NAUSEA OR VOMITING. 30 tablet 1  . pantoprazole (PROTONIX) 40 MG tablet Take 1 tablet (40 mg total) by mouth 2 (two) times daily before a meal. 180 tablet 3  . potassium chloride 20 MEQ/15ML (10%) SOLN Take 15 mLs (20 mEq total) by mouth 2 (two) times daily. 900 mL 0  . predniSONE (DELTASONE) 20 MG tablet Take 3 tablets (60 mg total) by mouth daily with breakfast. (Patient taking differently: Take 10 mg by mouth daily with breakfast. ) 90 tablet 0  . spironolactone (ALDACTONE) 50 MG tablet Take 1 tablet (50 mg total) by mouth 2 (two) times daily. 60 tablet 5  . triamcinolone cream (KENALOG) 0.1 % Apply 1 application topically 2 (two) times daily.    . furosemide (LASIX) 40 MG tablet Take 40 mg by  mouth as needed.      No current facility-administered medications on file prior to visit.        Objective:   Physical ExamBlood pressure 122/60, pulse 80, temperature 97 F (36.1 C), height 5' 4.5" (1.638 m), weight 138 lb (62.596 kg). Alert and oriented. Skin warm and dry. Oral mucosa is moist.   . Sclera anicteric, conjunctivae is pink. Thyroid not enlarged. No cervical lymphadenopathy. Lungs clear. Heart regular rate and rhythm.  Abdomen is soft. Bowel sounds are positive. No hepatomegaly. No abdominal masses felt. No tenderness.  2+ edema to lower extremities.   Patient declined to let me examine her rectal  area.        Assessment & Plan:   Crohn's disease. Hx of multiple surgeries for small bowel diseae. She has a rectovaginal fistula and had a seton placed in February.  Presently taking Remicade infusion.  She has had 2 Remicade infusions so far.   CBC and Bmet today.

## 2015-03-25 NOTE — Telephone Encounter (Signed)
I saw Danielle [name] today. She is out of her Lomotil. Pharmacy would not refill it till end of month. ? Whether Dr. Laural Golden wants to refill it early. Thanks, Karna Christmas

## 2015-03-26 ENCOUNTER — Other Ambulatory Visit (INDEPENDENT_AMBULATORY_CARE_PROVIDER_SITE_OTHER): Payer: Self-pay | Admitting: *Deleted

## 2015-03-26 DIAGNOSIS — R197 Diarrhea, unspecified: Secondary | ICD-10-CM

## 2015-03-26 DIAGNOSIS — K50919 Crohn's disease, unspecified, with unspecified complications: Secondary | ICD-10-CM

## 2015-03-26 NOTE — Telephone Encounter (Deleted)
.  per Dr.Rehman may refill t his medication for the patient.

## 2015-03-26 NOTE — Telephone Encounter (Signed)
Pharmacy was called and given an oksy for the Lomotil to be filled per the pharmacist this is a controlled drug and they cannot fill until the end of the months. This most recent fill was on 03/07/15 #120. Patient has a refill on this medication.

## 2015-03-27 ENCOUNTER — Telehealth (INDEPENDENT_AMBULATORY_CARE_PROVIDER_SITE_OTHER): Payer: Self-pay | Admitting: *Deleted

## 2015-03-27 NOTE — Telephone Encounter (Signed)
Talked with the patient. She states that she cannot get the Lomotil. I explained that we did try to give another Rx on 03/26/15 , and the Pharmacist stated that they filled it on 03/07/15 for #120. This is a controled substance and we will have to wait for the 1 month. Patient was advised that she could take Imodium over the counter 1 by by mouth up to three times daily. Patient voiced understanding , says that she will do the Imodium , and her sister had gone to get her Dicyclomine filled , she has run out of that.  Patient will call with a PR by Friday.

## 2015-03-27 NOTE — Telephone Encounter (Signed)
Danielle [name] said she is having really bad diarrhea. Would like for [name] to return her call at [phone]. Patient was advised the Lomotil could not be refilled until the end of April. Voices understood.

## 2015-03-28 DIAGNOSIS — K50113 Crohn's disease of large intestine with fistula: Secondary | ICD-10-CM | POA: Diagnosis not present

## 2015-03-28 LAB — CBC WITH DIFFERENTIAL/PLATELET
Basophils Absolute: 0 10*3/uL (ref 0.0–0.1)
Basophils Relative: 0 % (ref 0–1)
Eosinophils Absolute: 0 10*3/uL (ref 0.0–0.7)
Eosinophils Relative: 0 % (ref 0–5)
HCT: 26.6 % — ABNORMAL LOW (ref 36.0–46.0)
Hemoglobin: 8.5 g/dL — ABNORMAL LOW (ref 12.0–15.0)
Lymphocytes Relative: 34 % (ref 12–46)
Lymphs Abs: 3.6 10*3/uL (ref 0.7–4.0)
MCH: 28.7 pg (ref 26.0–34.0)
MCHC: 32 g/dL (ref 30.0–36.0)
MCV: 89.9 fL (ref 78.0–100.0)
MONO ABS: 0.1 10*3/uL (ref 0.1–1.0)
MPV: 8.4 fL — ABNORMAL LOW (ref 8.6–12.4)
Monocytes Relative: 1 % — ABNORMAL LOW (ref 3–12)
Neutro Abs: 6.9 10*3/uL (ref 1.7–7.7)
Neutrophils Relative %: 65 % (ref 43–77)
PLATELETS: 338 10*3/uL (ref 150–400)
RBC: 2.96 MIL/uL — AB (ref 3.87–5.11)
RDW: 16.3 % — ABNORMAL HIGH (ref 11.5–15.5)
WBC: 10.6 10*3/uL — ABNORMAL HIGH (ref 4.0–10.5)

## 2015-03-29 LAB — BASIC METABOLIC PANEL
BUN: 9 mg/dL (ref 6–23)
CALCIUM: 7.3 mg/dL — AB (ref 8.4–10.5)
CHLORIDE: 115 meq/L — AB (ref 96–112)
CO2: 14 mEq/L — ABNORMAL LOW (ref 19–32)
Creat: 1.34 mg/dL — ABNORMAL HIGH (ref 0.50–1.10)
Glucose, Bld: 69 mg/dL — ABNORMAL LOW (ref 70–99)
Potassium: 3.3 mEq/L — ABNORMAL LOW (ref 3.5–5.3)
SODIUM: 142 meq/L (ref 135–145)

## 2015-03-31 ENCOUNTER — Other Ambulatory Visit (INDEPENDENT_AMBULATORY_CARE_PROVIDER_SITE_OTHER): Payer: Self-pay | Admitting: Internal Medicine

## 2015-04-03 ENCOUNTER — Encounter (HOSPITAL_COMMUNITY)
Admission: RE | Admit: 2015-04-03 | Discharge: 2015-04-03 | Disposition: A | Discharge status: Home / Self Care | Payer: Medicare Other | Source: Ambulatory Visit | Attending: Internal Medicine | Admitting: Internal Medicine

## 2015-04-03 DIAGNOSIS — K5090 Crohn's disease, unspecified, without complications: Secondary | ICD-10-CM | POA: Insufficient documentation

## 2015-04-03 IMAGING — CR DG CHEST 2V
2 series · 2 of 2 positions shown · non-contrast
Comparison: 07/12/2013

CLINICAL DATA: Shortness of breath

EXAM:
CHEST  2 VIEW

[w chest pa]
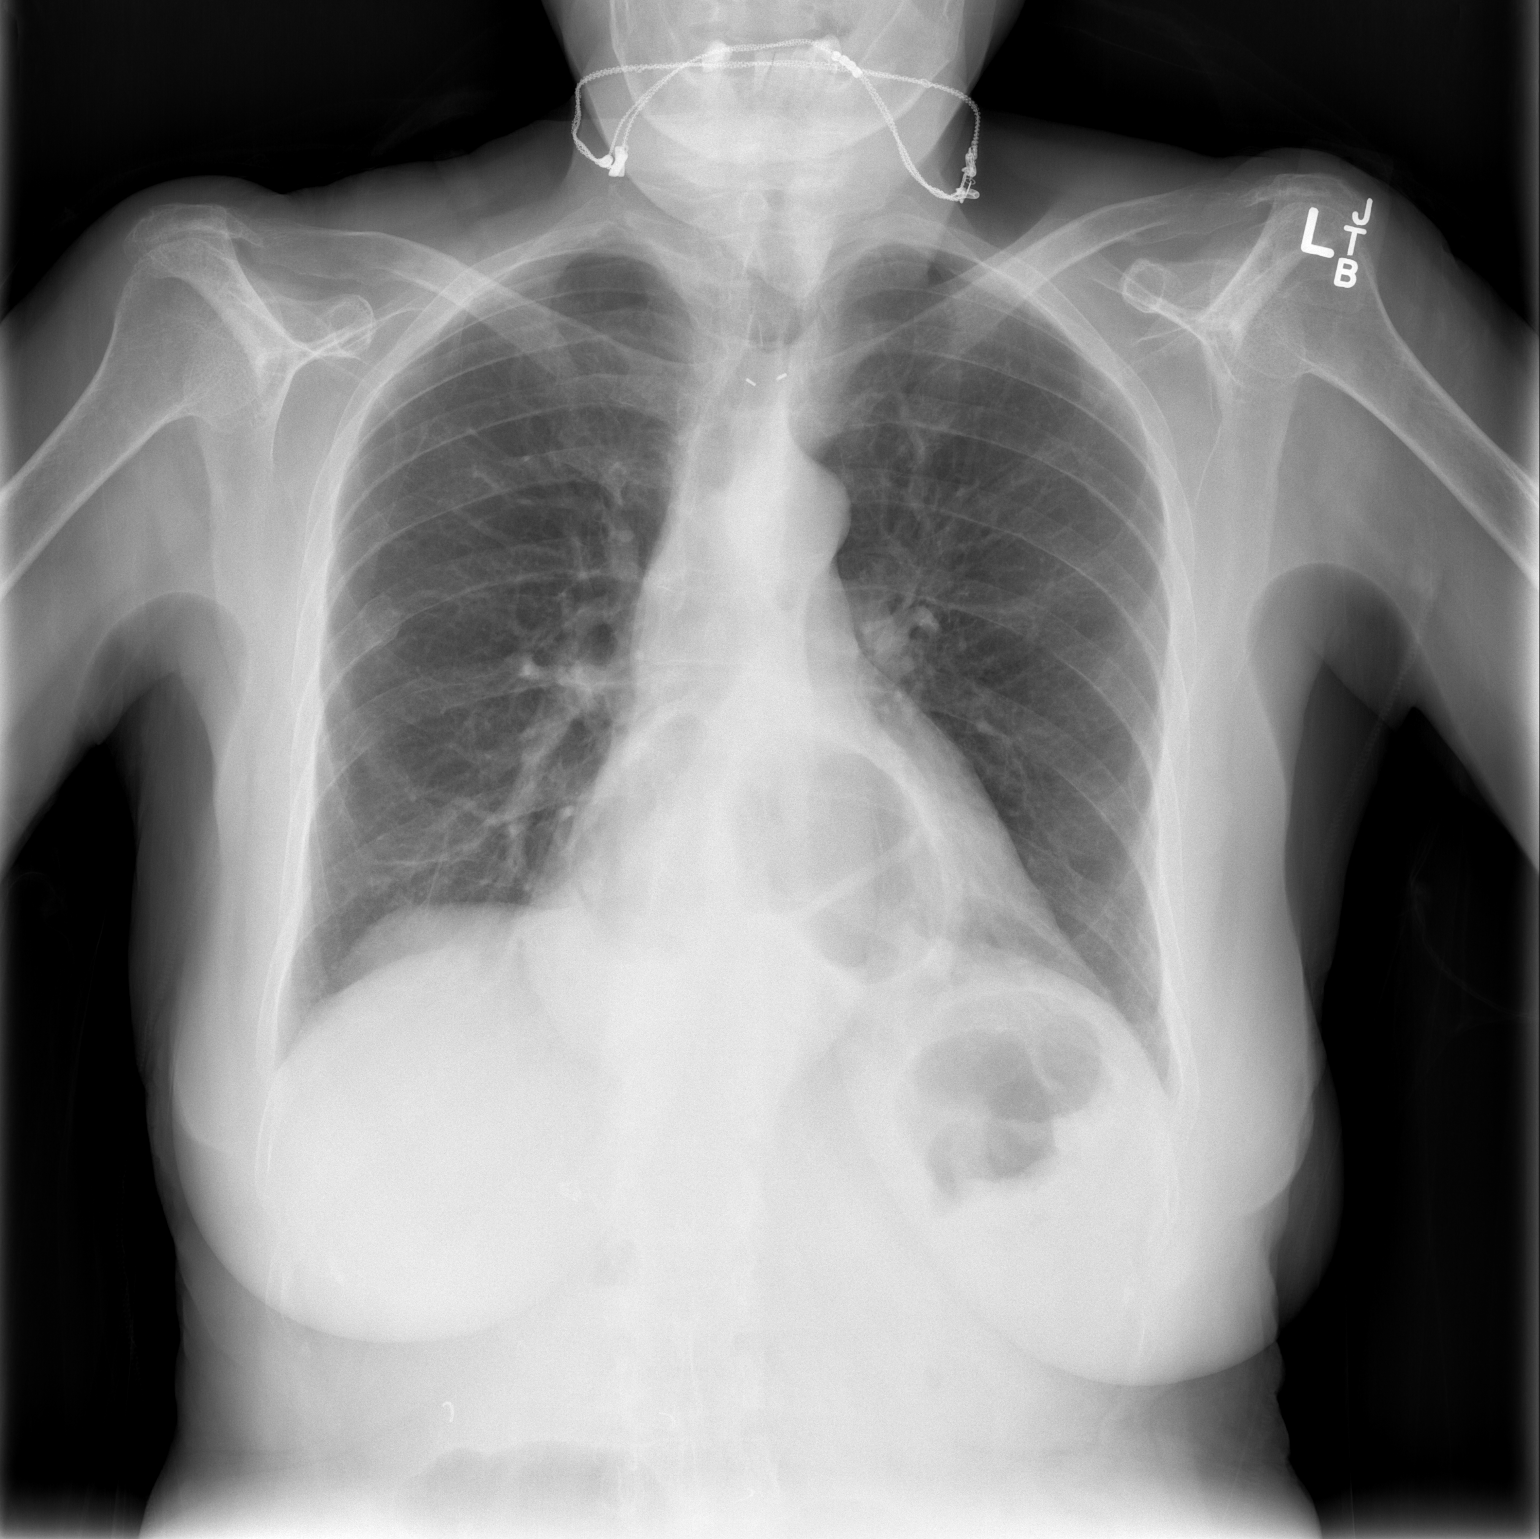

[w chest lat]
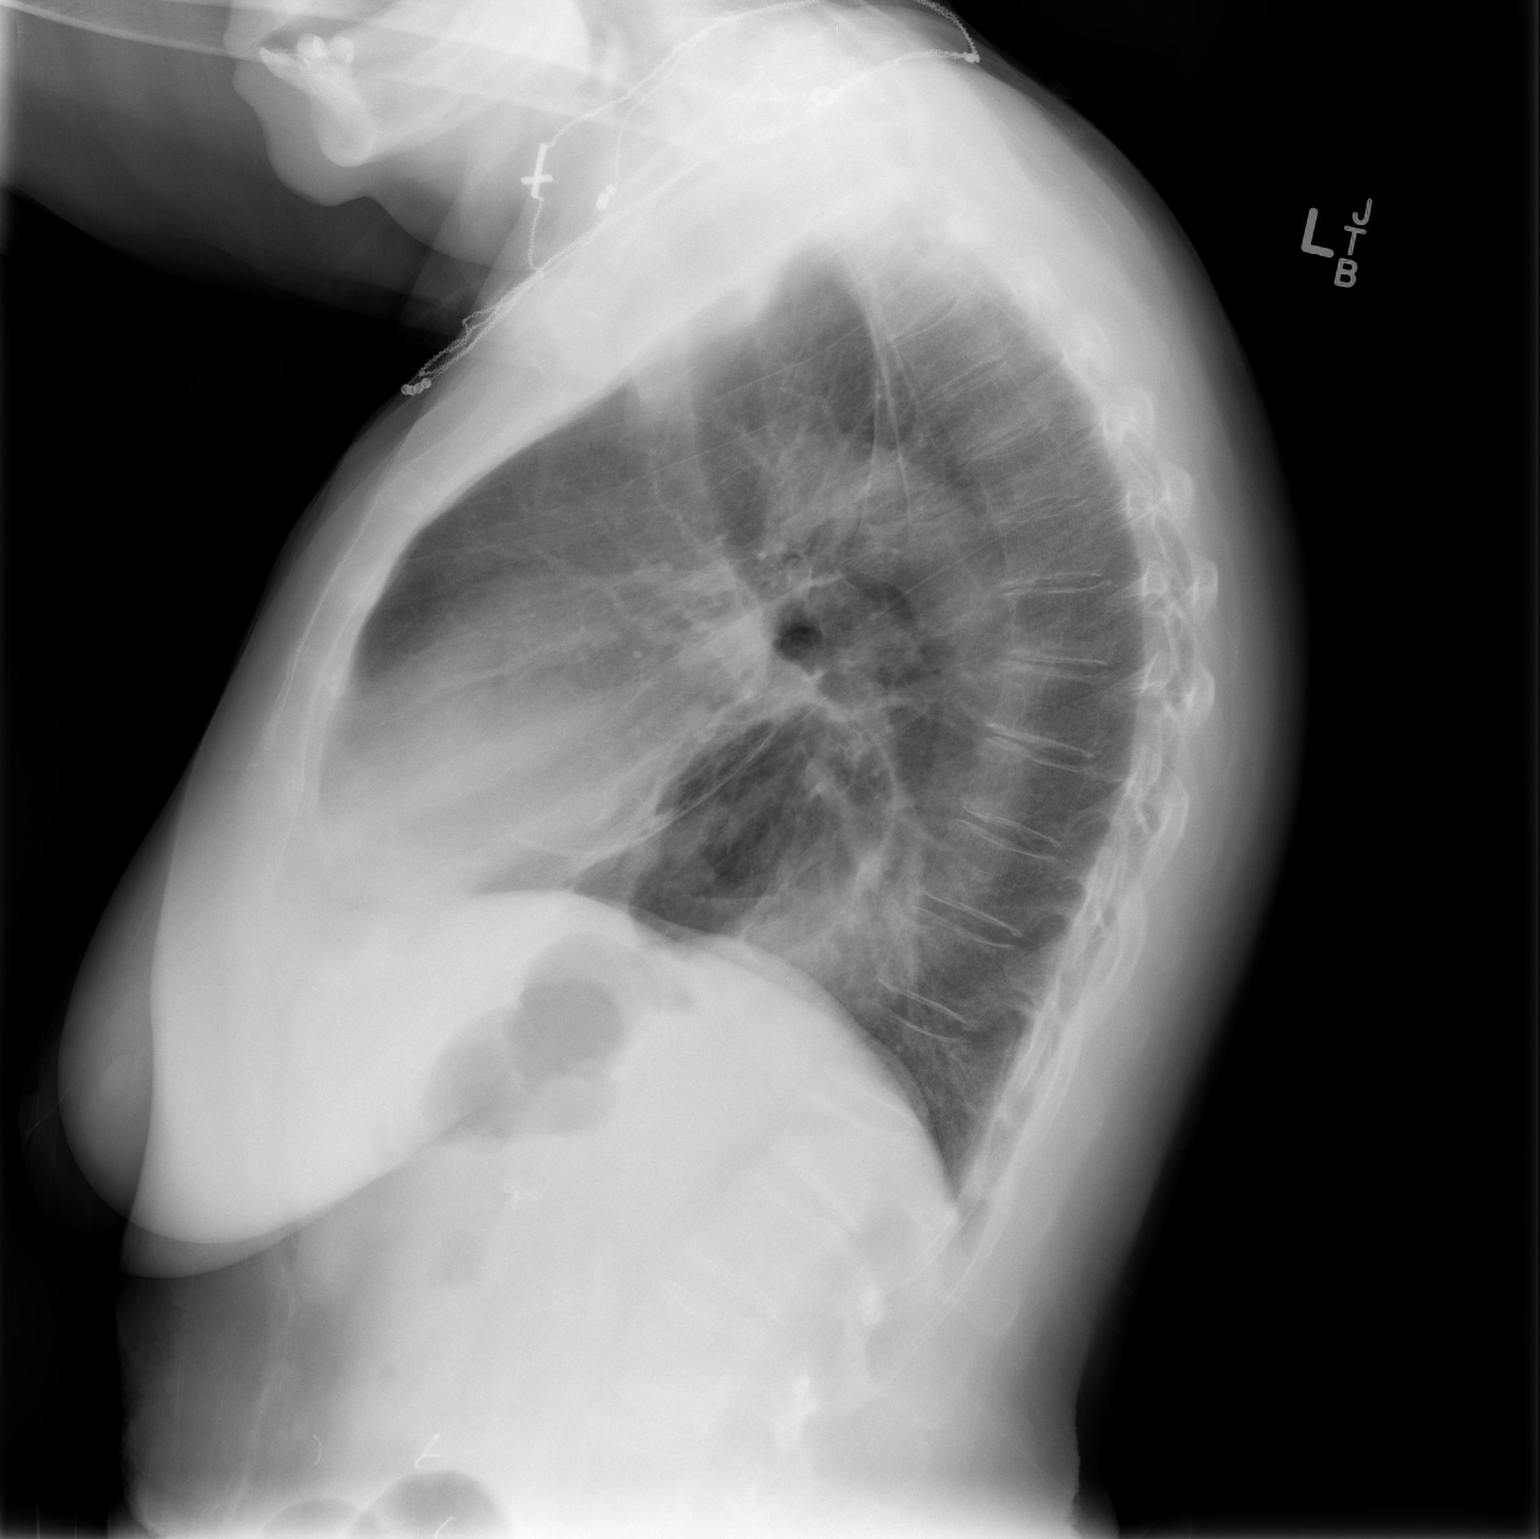

[2 of 2 positions shown; findings below may reference images not displayed]

FINDINGS: A large hiatal hernia is again identified. The cardiac shadow is
stable. Old rib fractures are seen on the right. The lungs are
clear. No focal infiltrate or sizable effusion is noted.
IMPRESSION: No active cardiopulmonary disease.

## 2015-04-03 MED ORDER — LORATADINE 10 MG PO TABS
10.0000 mg | ORAL_TABLET | Freq: Every day | ORAL | Status: DC
  Administered 2015-04-03: 10 mg via ORAL

## 2015-04-03 MED ORDER — LORATADINE 10 MG PO TABS
ORAL_TABLET | ORAL | Status: AC
  Filled 2015-04-03: qty 1

## 2015-04-03 MED ORDER — SODIUM CHLORIDE 0.9 % IV SOLN
INTRAVENOUS | Status: DC
  Administered 2015-04-03: 12:00:00 via INTRAVENOUS

## 2015-04-03 MED ORDER — ACETAMINOPHEN 500 MG PO TABS
ORAL_TABLET | ORAL | Status: AC
  Filled 2015-04-03: qty 1

## 2015-04-03 MED ORDER — ACETAMINOPHEN 325 MG PO TABS
650.0000 mg | ORAL_TABLET | Freq: Once | ORAL | Status: AC
  Administered 2015-04-03: 650 mg via ORAL
  Filled 2015-04-03: qty 2

## 2015-04-03 MED ORDER — SODIUM CHLORIDE 0.9 % IV SOLN
5.0000 mg/kg | INTRAVENOUS | Status: DC
  Administered 2015-04-03: 300 mg via INTRAVENOUS
  Filled 2015-04-03: qty 30

## 2015-04-03 MED ORDER — HEPARIN SOD (PORK) LOCK FLUSH 100 UNIT/ML IV SOLN: 500.0000 [IU] | INTRAVENOUS | Status: DC | PRN

## 2015-04-03 MED ORDER — SODIUM CHLORIDE 0.9 % IJ SOLN: 10.0000 mL | INTRAMUSCULAR | Status: DC | PRN

## 2015-04-25 ENCOUNTER — Other Ambulatory Visit (INDEPENDENT_AMBULATORY_CARE_PROVIDER_SITE_OTHER): Payer: Self-pay | Admitting: Internal Medicine

## 2015-04-26 ENCOUNTER — Other Ambulatory Visit (INDEPENDENT_AMBULATORY_CARE_PROVIDER_SITE_OTHER): Payer: Self-pay | Admitting: Internal Medicine

## 2015-04-26 ENCOUNTER — Telehealth (INDEPENDENT_AMBULATORY_CARE_PROVIDER_SITE_OTHER): Payer: Self-pay | Admitting: *Deleted

## 2015-04-26 NOTE — Telephone Encounter (Signed)
Patient has been advised and voice understood.

## 2015-04-26 NOTE — Telephone Encounter (Signed)
Prescriptions have been completed and sent to the pharmacy, patient is aware.

## 2015-04-26 NOTE — Telephone Encounter (Signed)
Danielle [name] has called twice and LM asking to speak with [name]. She needs to talk with her about a refill request that has been sent to the office. Her return phone number is [phone].

## 2015-04-26 NOTE — Telephone Encounter (Signed)
Prescriptions are inbox, waiting to get cleared with Dr.Rehman. Patient call after that.

## 2015-05-06 ENCOUNTER — Other Ambulatory Visit (INDEPENDENT_AMBULATORY_CARE_PROVIDER_SITE_OTHER): Payer: Self-pay | Admitting: Internal Medicine

## 2015-05-13 ENCOUNTER — Other Ambulatory Visit (INDEPENDENT_AMBULATORY_CARE_PROVIDER_SITE_OTHER): Payer: Self-pay | Admitting: *Deleted

## 2015-05-13 ENCOUNTER — Telehealth (INDEPENDENT_AMBULATORY_CARE_PROVIDER_SITE_OTHER): Payer: Self-pay | Admitting: *Deleted

## 2015-05-13 DIAGNOSIS — R197 Diarrhea, unspecified: Secondary | ICD-10-CM

## 2015-05-13 MED ORDER — DIPHENOXYLATE-ATROPINE 2.5-0.025 MG PO TABS: ORAL_TABLET | ORAL | Status: DC

## 2015-05-13 MED ORDER — CHOLESTYRAMINE 4 G PO PACK: 4.0000 g | PACK | Freq: Two times a day (BID) | ORAL | Status: DC

## 2015-05-13 NOTE — Telephone Encounter (Signed)
Per Dr.Rehman the patient will need to have C-Diff as she has had a lot of antibiotics. He will do a prescription for Lomotil so that the patient can take 4 times daily.Also she will need to take Questran 4 grams BID - 2 hours before a meal or 2 hours after a meal. [facility] [address], her brother will pick up the prescription here in the office 05-14-15.

## 2015-05-13 NOTE — Telephone Encounter (Signed)
Patient states that she is having 9-10 bowel movements a day ,nothing but pure water. Per Dr.Rehman the patient will need to have C-Diff collected. May take Lomotil 4 times daily, a new prescription to be done and picked up by brother 05/14/15. Questran 4 grams BID - take twice a day - 2 hours before meals or 2 hours after.

## 2015-05-13 NOTE — Telephone Encounter (Signed)
Danielle [name] would like for [name] to return her call. She has a question about one of her medicines. The return phone number is [phone].

## 2015-05-22 ENCOUNTER — Encounter (HOSPITAL_COMMUNITY): Payer: Self-pay

## 2015-05-22 ENCOUNTER — Encounter (HOSPITAL_COMMUNITY)
Admission: RE | Admit: 2015-05-22 | Discharge: 2015-05-22 | Disposition: A | Discharge status: Home / Self Care | Payer: Medicare Other | Source: Ambulatory Visit | Attending: Internal Medicine | Admitting: Internal Medicine

## 2015-05-22 DIAGNOSIS — K5090 Crohn's disease, unspecified, without complications: Secondary | ICD-10-CM | POA: Diagnosis not present

## 2015-05-22 MED ORDER — SODIUM CHLORIDE 0.9 % IV SOLN
INTRAVENOUS | Status: DC
  Administered 2015-05-22: 12:00:00 via INTRAVENOUS

## 2015-05-22 MED ORDER — INFLIXIMAB 100 MG IV SOLR
300.0000 mg | Freq: Once | INTRAVENOUS | Status: AC
  Administered 2015-05-22: 300 mg via INTRAVENOUS
  Filled 2015-05-22: qty 30

## 2015-05-22 MED ORDER — ACETAMINOPHEN 325 MG PO TABS
650.0000 mg | ORAL_TABLET | Freq: Once | ORAL | Status: AC
  Administered 2015-05-22: 650 mg via ORAL
  Filled 2015-05-22: qty 2

## 2015-05-22 MED ORDER — LORATADINE 10 MG PO TABS
10.0000 mg | ORAL_TABLET | Freq: Every day | ORAL | Status: DC
  Administered 2015-05-22: 10 mg via ORAL
  Filled 2015-05-22: qty 1

## 2015-05-23 ENCOUNTER — Other Ambulatory Visit: Payer: Self-pay | Admitting: General Surgery

## 2015-05-23 DIAGNOSIS — K50113 Crohn's disease of large intestine with fistula: Secondary | ICD-10-CM | POA: Diagnosis not present

## 2015-05-23 DIAGNOSIS — K611 Rectal abscess: Secondary | ICD-10-CM

## 2015-05-24 ENCOUNTER — Ambulatory Visit
Admission: RE | Admit: 2015-05-24 | Discharge: 2015-05-24 | Disposition: A | Discharge status: Home / Self Care | Payer: Medicare Other | Source: Ambulatory Visit | Attending: General Surgery | Admitting: General Surgery

## 2015-05-24 DIAGNOSIS — K603 Anal fistula: Secondary | ICD-10-CM | POA: Diagnosis not present

## 2015-05-30 DIAGNOSIS — M7751 Other enthesopathy of right foot: Secondary | ICD-10-CM | POA: Diagnosis not present

## 2015-05-30 DIAGNOSIS — M65871 Other synovitis and tenosynovitis, right ankle and foot: Secondary | ICD-10-CM | POA: Diagnosis not present

## 2015-05-30 DIAGNOSIS — G5761 Lesion of plantar nerve, right lower limb: Secondary | ICD-10-CM | POA: Diagnosis not present

## 2015-05-30 DIAGNOSIS — M7989 Other specified soft tissue disorders: Secondary | ICD-10-CM | POA: Diagnosis not present

## 2015-06-12 ENCOUNTER — Other Ambulatory Visit (INDEPENDENT_AMBULATORY_CARE_PROVIDER_SITE_OTHER): Payer: Self-pay | Admitting: Internal Medicine

## 2015-06-13 ENCOUNTER — Telehealth (INDEPENDENT_AMBULATORY_CARE_PROVIDER_SITE_OTHER): Payer: Self-pay | Admitting: *Deleted

## 2015-06-13 NOTE — Telephone Encounter (Signed)
Advised patient of her apt date and time. Voice agreed and said that was okay. Will need her medicine refilled before then and she will call her pharmacy.

## 2015-06-13 NOTE — Telephone Encounter (Signed)
Sparkles calls in needing to know does she have an appt. Soon if not she needs one.  Please call her back   [phone]

## 2015-06-21 ENCOUNTER — Telehealth (INDEPENDENT_AMBULATORY_CARE_PROVIDER_SITE_OTHER): Payer: Self-pay | Admitting: *Deleted

## 2015-06-21 NOTE — Telephone Encounter (Signed)
Danielle [name] said she stopped the Methotrexate. Had a lot of side effects: Diarrhea, Lots of gas, swelling in legs & feet, Dizzy, Tired, Cough and Breathing is off. She has tapered off the Prednisone to 10 mg.  The return phone number is [phone].

## 2015-06-24 ENCOUNTER — Telehealth (INDEPENDENT_AMBULATORY_CARE_PROVIDER_SITE_OTHER): Payer: Self-pay | Admitting: *Deleted

## 2015-06-24 DIAGNOSIS — D508 Other iron deficiency anemias: Secondary | ICD-10-CM | POA: Diagnosis not present

## 2015-06-24 DIAGNOSIS — K50913 Crohn's disease, unspecified, with fistula: Secondary | ICD-10-CM | POA: Diagnosis not present

## 2015-06-24 DIAGNOSIS — K5090 Crohn's disease, unspecified, without complications: Secondary | ICD-10-CM | POA: Diagnosis not present

## 2015-06-24 NOTE — Telephone Encounter (Signed)
Per Dr.Rehman the patient will need to have labs drawn at [facility] in [place] on 06/25/15.

## 2015-06-24 NOTE — Telephone Encounter (Signed)
Talked with patient. Patient needs CBC and comprehensive chemistry panel. She will go to [facility] in [place]. Please send paper work.

## 2015-06-24 NOTE — Telephone Encounter (Signed)
Labs have been ordered

## 2015-06-26 LAB — CBC
HCT: 30.6 % — ABNORMAL LOW (ref 36.0–46.0)
HEMOGLOBIN: 9.5 g/dL — AB (ref 12.0–15.0)
MCH: 28.1 pg (ref 26.0–34.0)
MCHC: 31 g/dL (ref 30.0–36.0)
MCV: 90.5 fL (ref 78.0–100.0)
MPV: 9.2 fL (ref 8.6–12.4)
Platelets: 343 10*3/uL (ref 150–400)
RBC: 3.38 MIL/uL — ABNORMAL LOW (ref 3.87–5.11)
RDW: 16.5 % — AB (ref 11.5–15.5)
WBC: 10.5 10*3/uL (ref 4.0–10.5)

## 2015-06-26 LAB — COMPREHENSIVE METABOLIC PANEL
ALT: 16 U/L (ref 0–35)
AST: 15 U/L (ref 0–37)
Albumin: 2.9 g/dL — ABNORMAL LOW (ref 3.5–5.2)
Alkaline Phosphatase: 105 U/L (ref 39–117)
BUN: 7 mg/dL (ref 6–23)
CHLORIDE: 112 meq/L (ref 96–112)
CO2: 16 mEq/L — ABNORMAL LOW (ref 19–32)
Calcium: 8.6 mg/dL (ref 8.4–10.5)
Creat: 0.96 mg/dL (ref 0.50–1.10)
Glucose, Bld: 83 mg/dL (ref 70–99)
Potassium: 3.6 mEq/L (ref 3.5–5.3)
SODIUM: 140 meq/L (ref 135–145)
TOTAL PROTEIN: 6.3 g/dL (ref 6.0–8.3)
Total Bilirubin: 0.3 mg/dL (ref 0.2–1.2)

## 2015-06-27 DIAGNOSIS — Z0000 Encounter for general adult medical examination without abnormal findings: Secondary | ICD-10-CM | POA: Diagnosis not present

## 2015-07-03 ENCOUNTER — Other Ambulatory Visit (INDEPENDENT_AMBULATORY_CARE_PROVIDER_SITE_OTHER): Payer: Self-pay | Admitting: Internal Medicine

## 2015-07-09 ENCOUNTER — Ambulatory Visit (INDEPENDENT_AMBULATORY_CARE_PROVIDER_SITE_OTHER): Payer: Medicare Other | Admitting: Internal Medicine

## 2015-07-09 DIAGNOSIS — K50113 Crohn's disease of large intestine with fistula: Secondary | ICD-10-CM | POA: Diagnosis not present

## 2015-07-10 ENCOUNTER — Encounter (HOSPITAL_COMMUNITY)
Admission: RE | Admit: 2015-07-10 | Discharge: 2015-07-10 | Disposition: A | Discharge status: Home / Self Care | Payer: Medicare Other | Source: Ambulatory Visit | Attending: Internal Medicine | Admitting: Internal Medicine

## 2015-07-10 DIAGNOSIS — K5090 Crohn's disease, unspecified, without complications: Secondary | ICD-10-CM | POA: Diagnosis not present

## 2015-07-10 MED ORDER — SODIUM CHLORIDE 0.9 % IV SOLN
INTRAVENOUS | Status: DC
  Administered 2015-07-10: 250 mL via INTRAVENOUS

## 2015-07-10 MED ORDER — ACETAMINOPHEN 325 MG PO TABS
ORAL_TABLET | ORAL | Status: AC
  Filled 2015-07-10: qty 2

## 2015-07-10 MED ORDER — LORATADINE 10 MG PO TABS
10.0000 mg | ORAL_TABLET | Freq: Every day | ORAL | Status: DC
  Administered 2015-07-10: 10 mg via ORAL

## 2015-07-10 MED ORDER — SODIUM CHLORIDE 0.9 % IV SOLN
300.0000 mg | INTRAVENOUS | Status: DC
  Administered 2015-07-10: 300 mg via INTRAVENOUS
  Filled 2015-07-10: qty 30

## 2015-07-10 MED ORDER — LORATADINE 10 MG PO TABS
ORAL_TABLET | ORAL | Status: AC
  Filled 2015-07-10: qty 1

## 2015-07-10 MED ORDER — ACETAMINOPHEN 325 MG PO TABS
650.0000 mg | ORAL_TABLET | Freq: Once | ORAL | Status: AC
  Administered 2015-07-10: 650 mg via ORAL

## 2015-07-15 ENCOUNTER — Other Ambulatory Visit (INDEPENDENT_AMBULATORY_CARE_PROVIDER_SITE_OTHER): Payer: Self-pay | Admitting: Internal Medicine

## 2015-07-16 ENCOUNTER — Ambulatory Visit (INDEPENDENT_AMBULATORY_CARE_PROVIDER_SITE_OTHER): Payer: Medicare Other | Admitting: Internal Medicine

## 2015-07-16 ENCOUNTER — Other Ambulatory Visit (INDEPENDENT_AMBULATORY_CARE_PROVIDER_SITE_OTHER): Payer: Self-pay | Admitting: *Deleted

## 2015-07-16 DIAGNOSIS — E876 Hypokalemia: Secondary | ICD-10-CM

## 2015-07-16 DIAGNOSIS — H44112 Panuveitis, left eye: Secondary | ICD-10-CM | POA: Diagnosis not present

## 2015-07-16 MED ORDER — POTASSIUM CHLORIDE 20 MEQ/15ML (10%) PO SOLN: 20.0000 meq | Freq: Two times a day (BID) | ORAL | Status: DC

## 2015-07-16 NOTE — Telephone Encounter (Signed)
Patient called and ask that we refill her K+ along with her Bentyl. Per Dr.Rehman may approve both as written.

## 2015-07-18 DIAGNOSIS — H43813 Vitreous degeneration, bilateral: Secondary | ICD-10-CM | POA: Diagnosis not present

## 2015-07-18 DIAGNOSIS — H3531 Nonexudative age-related macular degeneration: Secondary | ICD-10-CM | POA: Diagnosis not present

## 2015-07-22 ENCOUNTER — Other Ambulatory Visit (INDEPENDENT_AMBULATORY_CARE_PROVIDER_SITE_OTHER): Payer: Self-pay | Admitting: Internal Medicine

## 2015-07-22 IMAGING — CR DG CHEST 2V
2 series · 2 of 2 positions shown · non-contrast
Comparison: 03/20/2014

CLINICAL DATA: Chest and shoulder pain

EXAM:
CHEST  2 VIEW

[w chest pa]
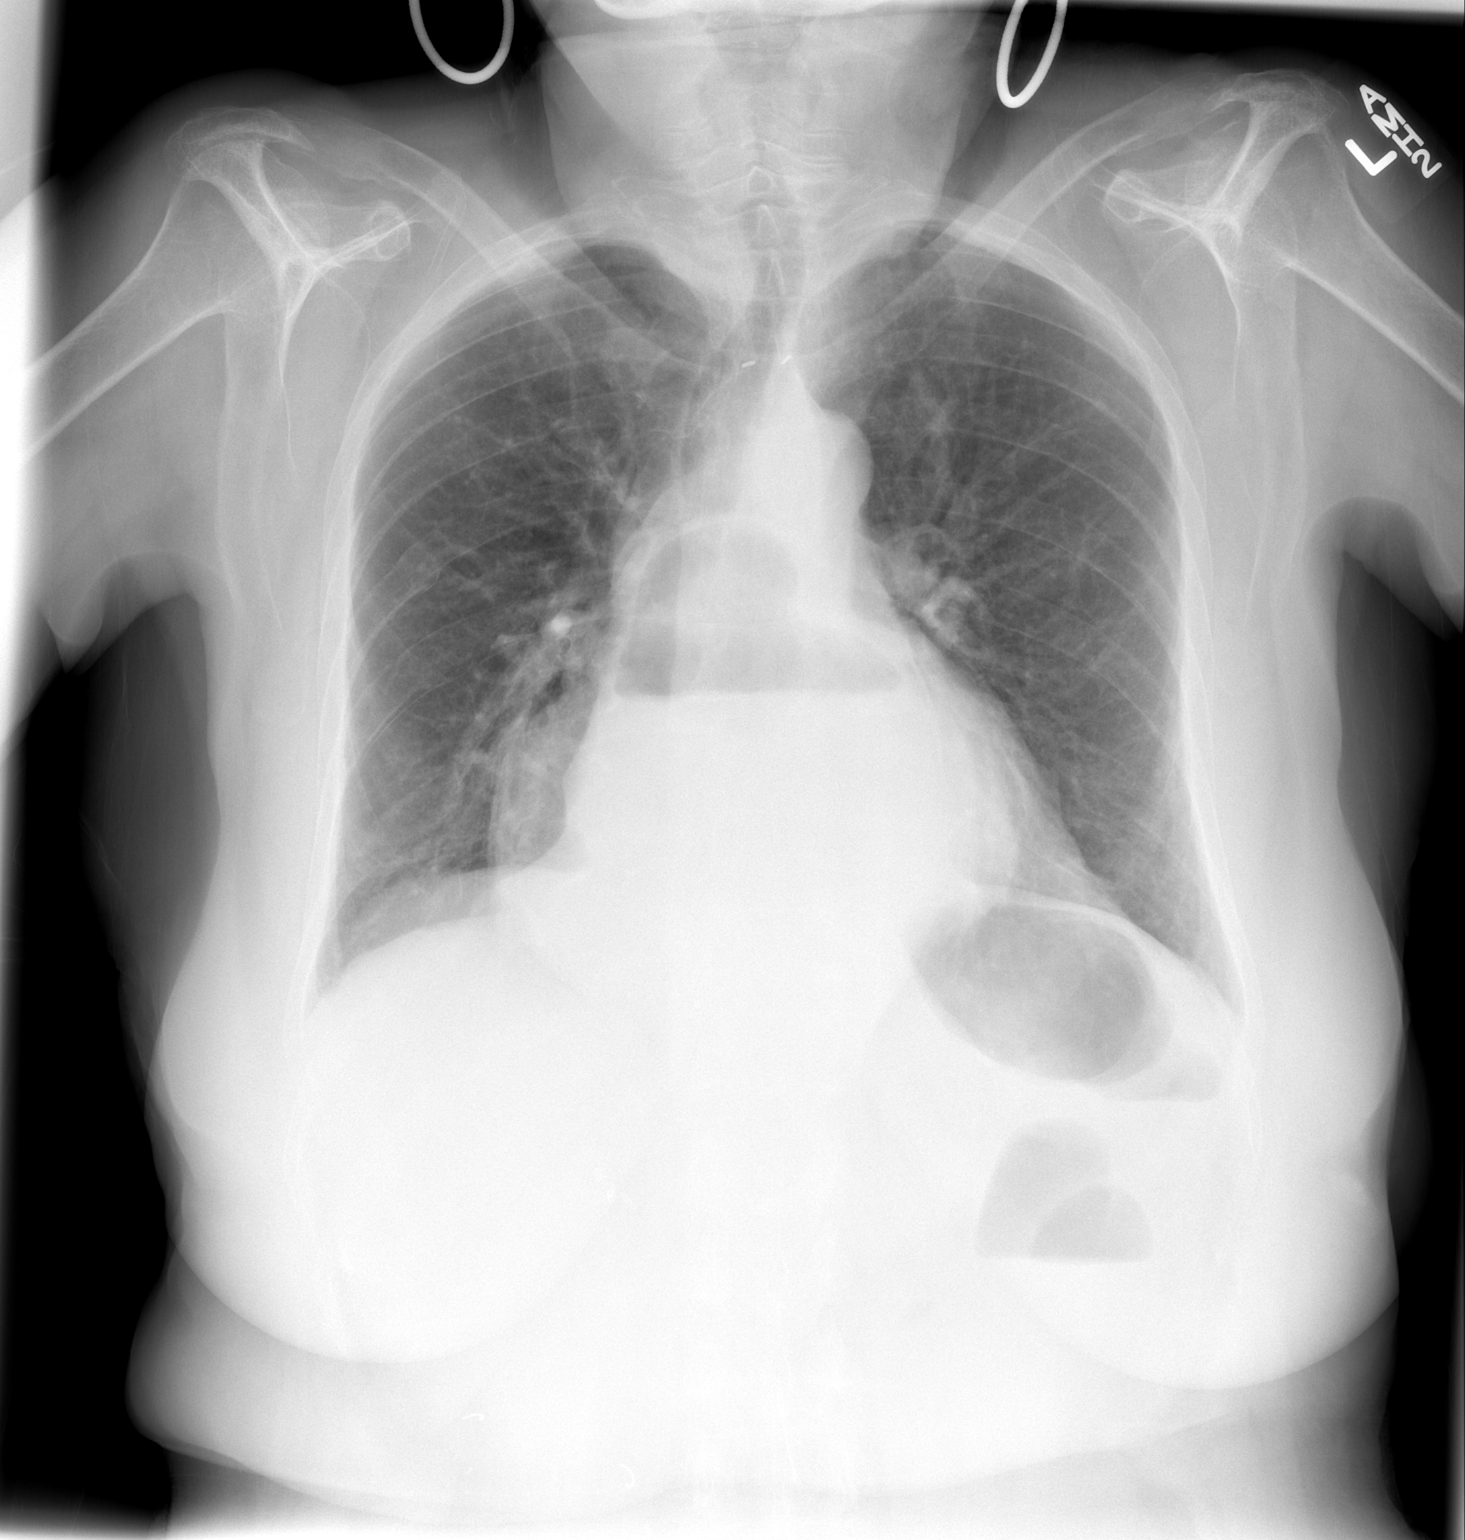

[w chest lat]
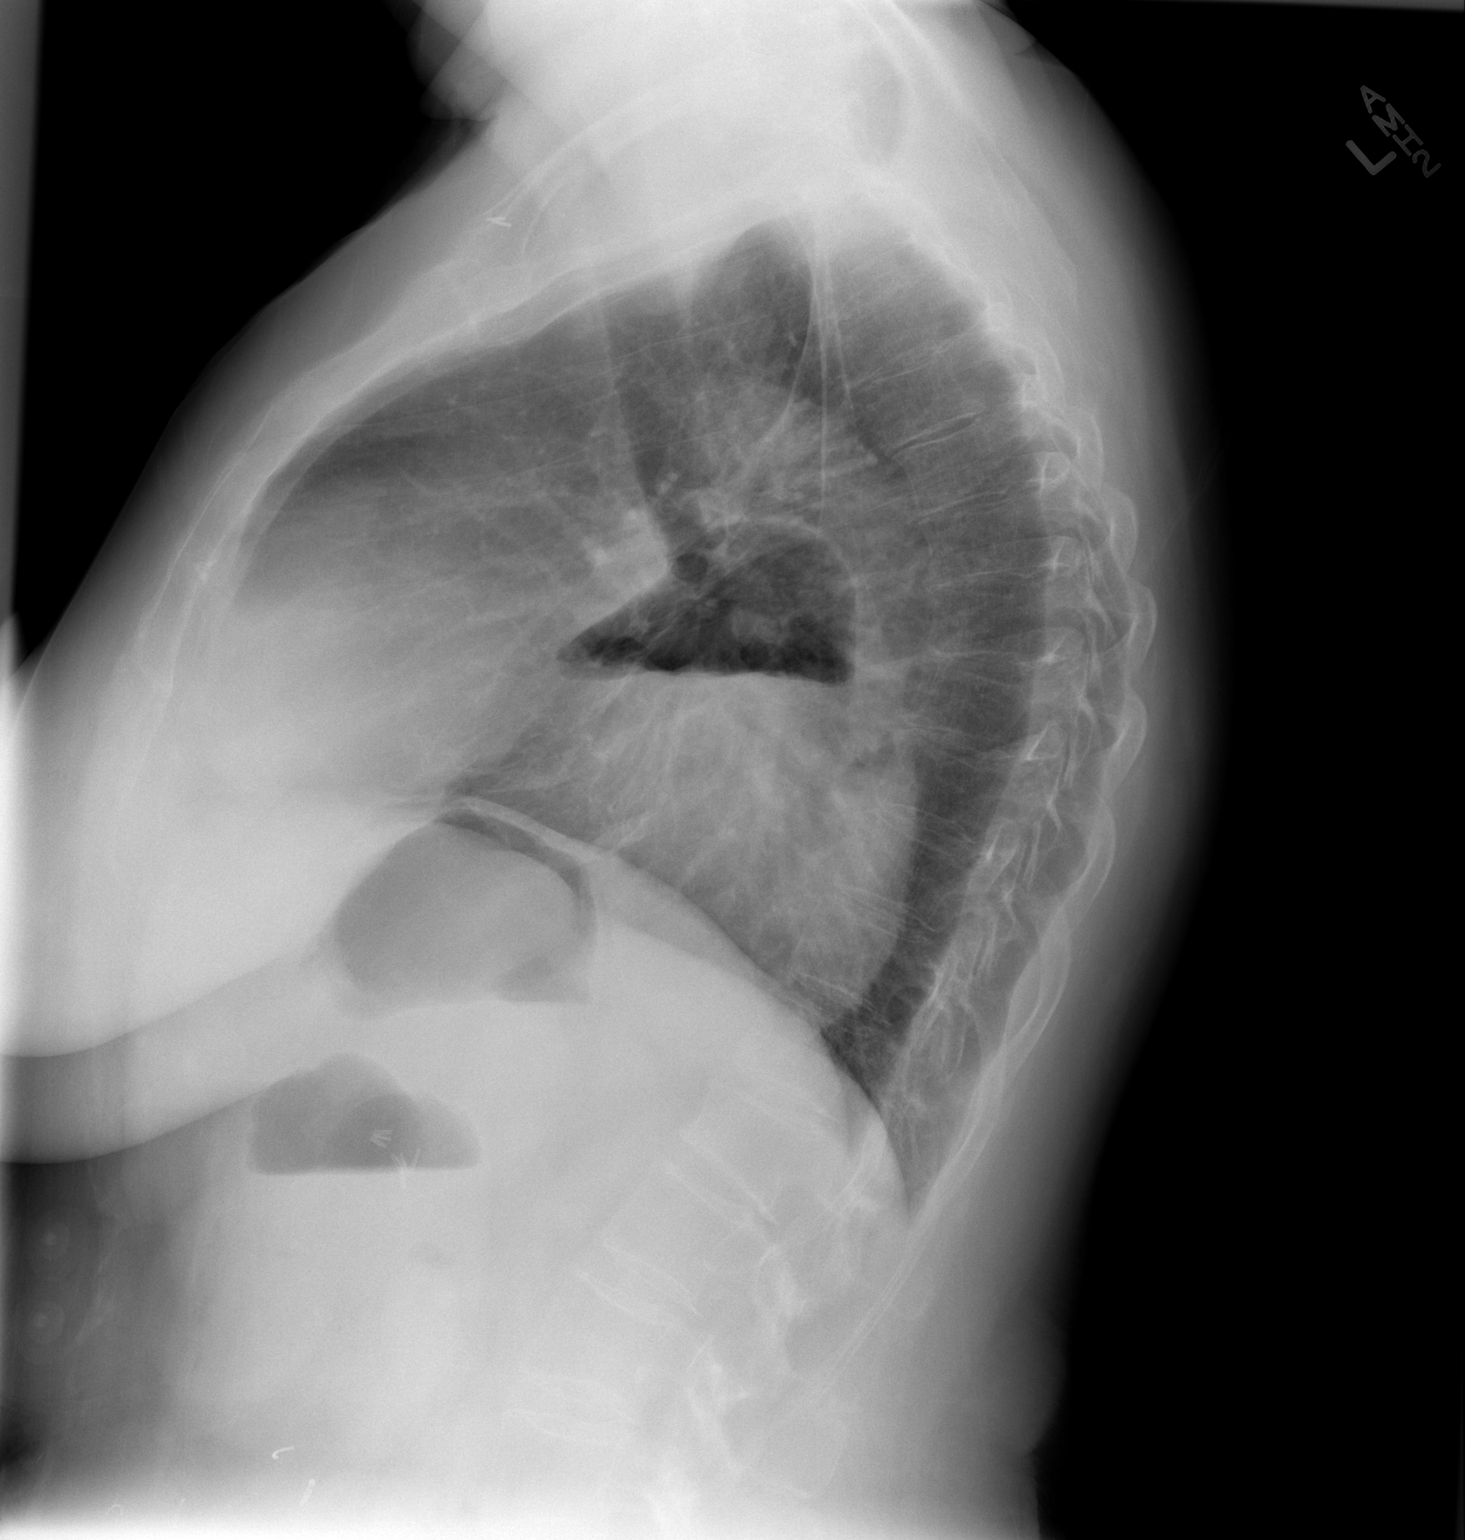

[2 of 2 positions shown; findings below may reference images not displayed]

FINDINGS: A large hiatal hernia is again identified. The cardiac shadow is
stable. The lungs are well-aerated without focal infiltrate or
sizable effusion. No bony abnormality is seen. Old rib fractures are
again seen on the right.
IMPRESSION: No active cardiopulmonary disease.

## 2015-07-25 DIAGNOSIS — H43813 Vitreous degeneration, bilateral: Secondary | ICD-10-CM | POA: Diagnosis not present

## 2015-07-29 ENCOUNTER — Other Ambulatory Visit (INDEPENDENT_AMBULATORY_CARE_PROVIDER_SITE_OTHER): Payer: Self-pay | Admitting: *Deleted

## 2015-07-29 ENCOUNTER — Emergency Department (HOSPITAL_COMMUNITY): Payer: Medicare Other

## 2015-07-29 ENCOUNTER — Encounter (HOSPITAL_COMMUNITY): Payer: Self-pay | Admitting: Family Medicine

## 2015-07-29 ENCOUNTER — Emergency Department (HOSPITAL_COMMUNITY)
Admission: EM | Admit: 2015-07-29 | Discharge: 2015-07-30 | Disposition: A | Discharge status: Home / Self Care | Payer: Medicare Other | Attending: Emergency Medicine | Admitting: Emergency Medicine

## 2015-07-29 ENCOUNTER — Telehealth (INDEPENDENT_AMBULATORY_CARE_PROVIDER_SITE_OTHER): Payer: Self-pay | Admitting: *Deleted

## 2015-07-29 DIAGNOSIS — K5090 Crohn's disease, unspecified, without complications: Secondary | ICD-10-CM | POA: Diagnosis not present

## 2015-07-29 DIAGNOSIS — Z9049 Acquired absence of other specified parts of digestive tract: Secondary | ICD-10-CM | POA: Diagnosis not present

## 2015-07-29 DIAGNOSIS — M109 Gout, unspecified: Secondary | ICD-10-CM | POA: Diagnosis not present

## 2015-07-29 DIAGNOSIS — Z79899 Other long term (current) drug therapy: Secondary | ICD-10-CM | POA: Diagnosis not present

## 2015-07-29 DIAGNOSIS — Z8701 Personal history of pneumonia (recurrent): Secondary | ICD-10-CM | POA: Diagnosis not present

## 2015-07-29 DIAGNOSIS — R197 Diarrhea, unspecified: Secondary | ICD-10-CM | POA: Diagnosis not present

## 2015-07-29 DIAGNOSIS — K219 Gastro-esophageal reflux disease without esophagitis: Secondary | ICD-10-CM | POA: Diagnosis not present

## 2015-07-29 DIAGNOSIS — Z862 Personal history of diseases of the blood and blood-forming organs and certain disorders involving the immune mechanism: Secondary | ICD-10-CM | POA: Insufficient documentation

## 2015-07-29 DIAGNOSIS — E876 Hypokalemia: Secondary | ICD-10-CM | POA: Diagnosis not present

## 2015-07-29 DIAGNOSIS — Z872 Personal history of diseases of the skin and subcutaneous tissue: Secondary | ICD-10-CM | POA: Insufficient documentation

## 2015-07-29 DIAGNOSIS — R52 Pain, unspecified: Secondary | ICD-10-CM

## 2015-07-29 DIAGNOSIS — Z9889 Other specified postprocedural states: Secondary | ICD-10-CM | POA: Diagnosis not present

## 2015-07-29 DIAGNOSIS — Z87891 Personal history of nicotine dependence: Secondary | ICD-10-CM | POA: Insufficient documentation

## 2015-07-29 DIAGNOSIS — N390 Urinary tract infection, site not specified: Secondary | ICD-10-CM | POA: Diagnosis not present

## 2015-07-29 DIAGNOSIS — K6389 Other specified diseases of intestine: Secondary | ICD-10-CM | POA: Diagnosis not present

## 2015-07-29 DIAGNOSIS — R0602 Shortness of breath: Secondary | ICD-10-CM | POA: Diagnosis not present

## 2015-07-29 DIAGNOSIS — R1013 Epigastric pain: Secondary | ICD-10-CM | POA: Diagnosis not present

## 2015-07-29 DIAGNOSIS — R140 Abdominal distension (gaseous): Secondary | ICD-10-CM | POA: Diagnosis not present

## 2015-07-29 DIAGNOSIS — R1110 Vomiting, unspecified: Secondary | ICD-10-CM | POA: Diagnosis not present

## 2015-07-29 DIAGNOSIS — R079 Chest pain, unspecified: Secondary | ICD-10-CM | POA: Diagnosis not present

## 2015-07-29 LAB — CBC
HCT: 33.6 % — ABNORMAL LOW (ref 36.0–46.0)
Hemoglobin: 10.4 g/dL — ABNORMAL LOW (ref 12.0–15.0)
MCH: 26.1 pg (ref 26.0–34.0)
MCHC: 31 g/dL (ref 30.0–36.0)
MCV: 84.2 fL (ref 78.0–100.0)
Platelets: 440 10*3/uL — ABNORMAL HIGH (ref 150–400)
RBC: 3.99 MIL/uL (ref 3.87–5.11)
RDW: 15.5 % (ref 11.5–15.5)
WBC: 13.8 10*3/uL — AB (ref 4.0–10.5)

## 2015-07-29 LAB — HEPATIC FUNCTION PANEL
ALBUMIN: 3.2 g/dL — AB (ref 3.5–5.0)
ALT: 12 U/L — ABNORMAL LOW (ref 14–54)
AST: 27 U/L (ref 15–41)
Alkaline Phosphatase: 98 U/L (ref 38–126)
Bilirubin, Direct: <0.1 mg/dL — ABNORMAL LOW (ref 0.1–0.5)
TOTAL PROTEIN: 7.3 g/dL (ref 6.5–8.1)
Total Bilirubin: 0.5 mg/dL (ref 0.3–1.2)

## 2015-07-29 LAB — I-STAT TROPONIN, ED: Troponin i, poc: 0 ng/mL (ref 0.00–0.08)

## 2015-07-29 LAB — BASIC METABOLIC PANEL
Anion gap: 8 (ref 5–15)
BUN: 7 mg/dL (ref 6–20)
CHLORIDE: 116 mmol/L — AB (ref 101–111)
CO2: 15 mmol/L — ABNORMAL LOW (ref 22–32)
CREATININE: 1.41 mg/dL — AB (ref 0.44–1.00)
Calcium: 8.7 mg/dL — ABNORMAL LOW (ref 8.9–10.3)
GFR, EST AFRICAN AMERICAN: 44 mL/min — AB (ref >60)
GFR, EST NON AFRICAN AMERICAN: 38 mL/min — AB (ref >60)
Glucose, Bld: 87 mg/dL (ref 65–99)
POTASSIUM: 2.7 mmol/L — AB (ref 3.5–5.1)
Sodium: 139 mmol/L (ref 135–145)

## 2015-07-29 LAB — URINALYSIS, ROUTINE W REFLEX MICROSCOPIC
Bilirubin Urine: NEGATIVE
Glucose, UA: NEGATIVE mg/dL
KETONES UR: NEGATIVE mg/dL
NITRITE: NEGATIVE
Protein, ur: 30 mg/dL — AB
Specific Gravity, Urine: 1.016 (ref 1.005–1.030)
Urobilinogen, UA: 0.2 mg/dL (ref 0.0–1.0)
pH: 6.5 (ref 5.0–8.0)

## 2015-07-29 LAB — URINE MICROSCOPIC-ADD ON

## 2015-07-29 LAB — LIPASE, BLOOD: LIPASE: 57 U/L — AB (ref 22–51)

## 2015-07-29 MED ORDER — LIDOCAINE HCL 1 % IJ SOLN
INTRAMUSCULAR | Status: AC
  Filled 2015-07-29: qty 20

## 2015-07-29 MED ORDER — POTASSIUM CHLORIDE 10 MEQ/100ML IV SOLN: 10.0000 meq | Freq: Once | INTRAVENOUS | Status: DC

## 2015-07-29 MED ORDER — POTASSIUM CHLORIDE CRYS ER 20 MEQ PO TBCR: 40.0000 meq | EXTENDED_RELEASE_TABLET | Freq: Two times a day (BID) | ORAL | Status: DC

## 2015-07-29 MED ORDER — SODIUM CHLORIDE 0.9 % IV BOLUS (SEPSIS)
1000.0000 mL | Freq: Once | INTRAVENOUS | Status: AC
  Administered 2015-07-30: 1000 mL via INTRAVENOUS

## 2015-07-29 MED ORDER — SODIUM CHLORIDE 0.9 % IV BOLUS (SEPSIS)
1000.0000 mL | Freq: Once | INTRAVENOUS | Status: AC
  Administered 2015-07-29: 1000 mL via INTRAVENOUS

## 2015-07-29 MED ORDER — POTASSIUM CHLORIDE 10 MEQ/100ML IV SOLN
10.0000 meq | Freq: Once | INTRAVENOUS | Status: AC
  Administered 2015-07-30: 10 meq via INTRAVENOUS
  Filled 2015-07-29: qty 100

## 2015-07-29 MED ORDER — METOCLOPRAMIDE HCL 5 MG/ML IJ SOLN
5.0000 mg | Freq: Once | INTRAMUSCULAR | Status: AC
  Administered 2015-07-29: 5 mg via INTRAVENOUS
  Filled 2015-07-29: qty 2

## 2015-07-29 MED ORDER — POTASSIUM CHLORIDE CRYS ER 20 MEQ PO TBCR: 40.0000 meq | EXTENDED_RELEASE_TABLET | Freq: Once | ORAL | Status: DC

## 2015-07-29 MED ORDER — MORPHINE SULFATE (PF) 4 MG/ML IV SOLN
4.0000 mg | Freq: Once | INTRAVENOUS | Status: AC
  Administered 2015-07-29: 4 mg via INTRAVENOUS
  Filled 2015-07-29: qty 1

## 2015-07-29 NOTE — ED Provider Notes (Signed)
CSN: [id number]     Arrival date & time 07/29/15  1914 History   First MD Initiated Contact with Patient 07/29/15 2101     Chief Complaint  Patient presents with  . Chest Pain     (Consider location/radiation/quality/duration/timing/severity/associated sxs/prior Treatment) HPI Complains of abdominal pain at epigastrium, nonradiating, typical of Crohn's disease flareups she's had in the past. Associated symptoms include vomiting 4 times today, no hematemesis and watery diarrhea 4 episodes. She presently denies any nausea. Pain is moderate.. She attempted to take Vicodin, and Zofran, which she vomited. She denies fever denies chest pain denies shortness of breath. Symptoms nonexertional. Nothing makes symptoms better or worse. No other associated symptoms. She does report that she's had improvement of symptoms in the past after treatment with prednisone Past Medical History  Diagnosis Date  . Crohn's disease   . Chronic diarrhea   . GERD (gastroesophageal reflux disease)   . Polyarthralgia   . Hiatal hernia   . Hemorrhoids   . Anemia   . Hiatal hernia     hiatal  . Pneumonia 2014  . Gout   . Psoriasis    Past Surgical History  Procedure Laterality Date  . Bowel resection  2009  . 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 Anal stenosis  ~ 2010  . Colonoscopy  2013  . Cholecystectomy  1990's  . Appendectomy  1980's  . Goiter  1999  . Esophagogastroduodenoscopy  11/11/2011    Procedure: ESOPHAGOGASTRODUODENOSCOPY (EGD);  Surgeon: Missy Sabins, MD;  Location: [facility] ENDOSCOPY;  Service: Endoscopy;  Laterality: N/A;  . Flexible sigmoidoscopy  11/11/2011    Procedure: FLEXIBLE SIGMOIDOSCOPY;  Surgeon: Missy Sabins, MD;  Location: [place];  Service: Endoscopy;  Laterality: N/A;  . Esophagogastroduodenoscopy  05/18/2012    Procedure: ESOPHAGOGASTRODUODENOSCOPY (EGD);  Surgeon: Rogene Houston, MD;  Location: [facility] ENDO SUITE;  Service: Endoscopy;  Laterality: N/A;  200  . Colostomy takedown  2009     "only had it for about 1 month" (08/22/2013)  . Left heart catheterization with coronary angiogram N/A 07/09/2014    Procedure: LEFT HEART CATHETERIZATION WITH CORONARY ANGIOGRAM;  Surgeon: Peter M Martinique, MD;  Location: [facility] CATH LAB;  Service: Cardiovascular;  Laterality: N/A;  . Proctoscopy N/A 01/31/2015    Procedure: RIGID PROCTOSCOPY;  Surgeon: Leighton Ruff, MD;  Location: [place];  Service: General;  Laterality: N/A;  . Rectal exam under anesthesia N/A 01/31/2015    Procedure: [place] OF A SETON;  Surgeon: Leighton Ruff, MD;  Location: [facility] OR;  Service: General;  Laterality: N/A;   Family History  Problem Relation Age of Onset  . Diabetes Mother   . Healthy Sister   . Hypertension Brother   . Colon cancer Brother     died with colon cancer  . Heart disease Father   . Crohn's disease Paternal Aunt    Social History  Substance Use Topics  . Smoking status: Former Smoker -- 0.12 packs/day for 4 years    Types: Cigarettes    Quit date: 10/05/1996  . Smokeless tobacco: Never Used     Comment: 08/22/2013 1 pack every two weeks when she did smoked  . Alcohol Use: No   OB History    No data available     Review of Systems  Constitutional: Negative.   HENT: Negative.   Respiratory: Negative.   Cardiovascular: Negative.   Gastrointestinal: Positive for nausea, vomiting, abdominal pain and diarrhea.  Genitourinary: Positive for dysuria.  Musculoskeletal: Negative.   Skin: Negative.   Neurological: Negative.   Psychiatric/Behavioral: Negative.   All other systems reviewed and are negative.     Allergies  Zithromax; Aspirin; Ciprofloxacin; and Sulfa antibiotics  Home Medications   Prior to Admission medications   Medication Sig Start Date End Date Taking? Authorizing Provider  Calcium Carbonate-Vitamin D (CALCIUM + D PO) Take 1 tablet by mouth daily.   Yes Historical Provider, MD  Cyanocobalamin (VITAMIN B-12 IJ) Inject 1 each as directed  every 30 (thirty) days. Around the 1st week of each month   Yes Historical Provider, MD  dicyclomine (BENTYL) 10 MG/5ML syrup TAKE 2 TEASPOONFUL BY MOUTH 3 TIMES A DAY BEFORE MEALS 07/16/15  Yes Rogene Houston, MD  diphenhydrAMINE (BENADRYL) 25 mg capsule Take 25 mg by mouth every 6 (six) hours as needed for itching. 03/22/14  Yes Adeline C Viyuoh, MD  diphenoxylate-atropine (LOMOTIL) 2.5-0.025 MG per tablet TAKE 1 TABLET BY MOUTH 4 TIMES DAILY AS NEEDED FOR DIARHEA AND LOOSE STOOL 05/13/15  Yes Rogene Houston, MD  furosemide (LASIX) 40 MG tablet Take 40 mg by mouth daily as needed for fluid or edema.    Yes Historical Provider, MD  HYDROcodone-acetaminophen (NORCO) 7.5-325 MG per tablet Take 1 tablet by mouth every 6 (six) hours as needed for moderate pain.   Yes Historical Provider, MD  Multiple Vitamin (MULTIVITAMIN WITH MINERALS) TABS Take 1 tablet by mouth daily.   Yes Historical Provider, MD  ondansetron (ZOFRAN) 4 MG tablet TAKE 1 TABLET BY MOUTH TWICE A DAY AS NEEDED FOR NAUSEA AND VOMITING 07/23/15  Yes Rogene Houston, MD  pantoprazole (PROTONIX) 40 MG tablet Take 1 tablet (40 mg total) by mouth 2 (two) times daily before a meal. 01/17/15  Yes Butch Penny, NP  potassium chloride 20 MEQ/15ML (10%) SOLN Take 15 mLs (20 mEq total) by mouth 2 (two) times daily. 07/16/15  Yes Rogene Houston, MD  spironolactone (ALDACTONE) 50 MG tablet Take 1 tablet (50 mg total) by mouth 2 (two) times daily. 02/18/15  Yes Rogene Houston, MD  albuterol (PROVENTIL HFA;VENTOLIN HFA) 108 (90 BASE) MCG/ACT inhaler Inhale 2 puffs into the lungs every 6 (six) hours as needed for wheezing. 02/27/13   Nishant Dhungel, MD  cholestyramine (QUESTRAN) 4 G packet Take 1 packet (4 g total) by mouth 2 (two) times daily. Patient not taking: Reported on 07/29/2015 05/13/15   Rogene Houston, MD  methotrexate (RHEUMATREX) 2.5 MG tablet Take 3 tablets (7.5 mg total) by mouth once a week. Caution:Chemotherapy. Protect from light. Patient  not taking: Reported on 07/29/2015 02/18/15   Rogene Houston, MD  nystatin (MYCOSTATIN/NYSTOP) 100000 UNIT/GM POWD APPLY 3 TIMES DAILY TO AFFECTED AREA Patient not taking: Reported on 07/29/2015 04/01/15   Rogene Houston, MD  nystatin cream (MYCOSTATIN) Apply topically 2 (two) times daily. Patient not taking: Reported on 07/29/2015 02/18/15   Rogene Houston, MD  predniSONE (DELTASONE) 20 MG tablet Take 0.5 tablets (10 mg total) by mouth daily with breakfast. 40 mg for 1 week , 30 mg for 1 week, 20 mg for 1 week , then 15 mg per week , 10 mg for 1 week , 5 mg for 1 week Patient not taking: Reported on 07/29/2015 04/26/15   Rogene Houston, MD   BP 107/65 mmHg  Pulse 92  Temp(Src) 98.2 F (36.8 C) (Oral)  Resp 14  Ht 5\' 4"  (1.626 m)  Wt 136  lb (61.689 kg)  BMI 23.33 kg/m2  SpO2 100% Physical Exam  Constitutional: She appears well-developed and well-nourished.  HENT:  Head: Normocephalic and atraumatic.  Eyes: Conjunctivae are normal. Pupils are equal, round, and reactive to light.  Neck: Neck supple. No tracheal deviation present. No thyromegaly present.  Cardiovascular: Normal rate and regular rhythm.   No murmur heard. Pulmonary/Chest: Effort normal and breath sounds normal.  Abdominal: Soft. Bowel sounds are normal. She exhibits no distension. There is tenderness.  Mild epigastric tenderness  Musculoskeletal: Normal range of motion. She exhibits no edema or tenderness.  Neurological: She is alert. Coordination normal.  Skin: Skin is warm and dry. No rash noted.  Psychiatric: She has a normal mood and affect.  Nursing note and vitals reviewed.   ED Course  Procedures (including critical care time) Labs Review Labs Reviewed  BASIC METABOLIC PANEL - Abnormal; Notable for the following:    Potassium 2.7 (*)    Chloride 116 (*)    CO2 15 (*)    Creatinine, Ser 1.41 (*)    Calcium 8.7 (*)    GFR calc non Af Amer 38 (*)    GFR calc Af Amer 44 (*)    All other components within  normal limits  CBC - Abnormal; Notable for the following:    WBC 13.8 (*)    Hemoglobin 10.4 (*)    HCT 33.6 (*)    Platelets 440 (*)    All other components within normal limits  I-STAT TROPOININ, ED    Imaging Review Dg Chest 2 View  07/29/2015   CLINICAL DATA:  Left-sided chest pain starting at noon today. Nausea, vomiting, shortness of breath, lightheadedness, and diarrhea. History of Crohn.  EXAM: CHEST  2 VIEW  COMPARISON:  07/08/2014  FINDINGS: Normal heart size and pulmonary vascularity. Large esophageal hiatal hernia behind the heart. No focal airspace disease or consolidation in the lungs. No blunting of costophrenic angles. No pneumothorax. Multiple old right rib fractures. Degenerative changes in the shoulders. Surgical clips in the right upper quadrant. No significant change since prior study.  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 No evidence of active pulmonary disease.   Electronically Signed   By: Lucienne Capers M.D.   On: 07/29/2015 20:48   I have personally reviewed and evaluated these images and lab results as part of my medical decision-making.   EKG Interpretation None     nursing was unable to establish peripheral IV access and unable to draw blood. Angiocath insertion Performed by: Orlie Dakin  Consent: Verbal consent obtained. Risks and benefits: risks, benefits and alternatives were discussed Time out: Immediately prior to procedure a "time out" was called to verify the correct patient, procedure, equipment, support staff and site/side marked as required.  Preparation: Patient was prepped and draped in the usual sterile fashion.  Vein Location: left external jugular vein    Gauge: 20  Normal blood return and flush without difficulty Patient tolerance: Patient tolerated the procedure well with no immediate complications.   11:45 PM patient feels improved after treatment with intravenous fluids, morphine and Reglan. No longer  nauseated X-rays viewed by me  2: 28 AM patient's pain and nausea are under control and she feels ready to go home. She is able to drink without difficulty Results for orders placed or performed during the hospital encounter of 12/08/70  Basic metabolic panel  Result Value Ref Range   Sodium 139 135 - 145 mmol/L   Potassium 2.7 (LL) 3.5 -  5.1 mmol/L   Chloride 116 (H) 101 - 111 mmol/L   CO2 15 (L) 22 - 32 mmol/L   Glucose, Bld 87 65 - 99 mg/dL   BUN 7 6 - 20 mg/dL   Creatinine, Ser 1.41 (H) 0.44 - 1.00 mg/dL   Calcium 8.7 (L) 8.9 - 10.3 mg/dL   GFR calc non Af Amer 38 (L) >60 mL/min   GFR calc Af Amer 44 (L) >60 mL/min   Anion gap 8 5 - 15  CBC  Result Value Ref Range   WBC 13.8 (H) 4.0 - 10.5 K/uL   RBC 3.99 3.87 - 5.11 MIL/uL   Hemoglobin 10.4 (L) 12.0 - 15.0 g/dL   HCT 33.6 (L) 36.0 - 46.0 %   MCV 84.2 78.0 - 100.0 fL   MCH 26.1 26.0 - 34.0 pg   MCHC 31.0 30.0 - 36.0 g/dL   RDW 15.5 11.5 - 15.5 %   Platelets 440 (H) 150 - 400 K/uL  Hepatic function panel  Result Value Ref Range   Total Protein 7.3 6.5 - 8.1 g/dL   Albumin 3.2 (L) 3.5 - 5.0 g/dL   AST 27 15 - 41 U/L   ALT 12 (L) 14 - 54 U/L   Alkaline Phosphatase 98 38 - 126 U/L   Total Bilirubin 0.5 0.3 - 1.2 mg/dL   Bilirubin, Direct <0.1 (L) 0.1 - 0.5 mg/dL   Indirect Bilirubin NOT CALCULATED 0.3 - 0.9 mg/dL  Lipase, blood  Result Value Ref Range   Lipase 57 (H) 22 - 51 U/L  Urinalysis, Routine w reflex microscopic (not at [facility])  Result Value Ref Range   Color, Urine YELLOW YELLOW   APPearance CLOUDY (A) CLEAR   Specific Gravity, Urine 1.016 1.005 - 1.030   pH 6.5 5.0 - 8.0   Glucose, UA NEGATIVE NEGATIVE mg/dL   Hgb urine dipstick SMALL (A) NEGATIVE   Bilirubin Urine NEGATIVE NEGATIVE   Ketones, ur NEGATIVE NEGATIVE mg/dL   Protein, ur 30 (A) NEGATIVE mg/dL   Urobilinogen, UA 0.2 0.0 - 1.0 mg/dL   Nitrite NEGATIVE NEGATIVE   Leukocytes, UA LARGE (A) NEGATIVE  Urine microscopic-add on  Result Value Ref  Range   Squamous Epithelial / LPF RARE RARE   WBC, UA 21-50 <3 WBC/hpf   RBC / HPF 3-6 <3 RBC/hpf   Bacteria, UA RARE RARE  I-stat troponin, ED  Result Value Ref Range   Troponin i, poc 0.00 0.00 - 0.08 ng/mL   Comment 3           Ct Abdomen Pelvis Wo Contrast  07/30/2015   CLINICAL DATA:  Crohn's flare paresis bowel obstruction. Nausea, vomiting, and diarrhea. Red cells and white cells in the urine. Elevated lipase.  EXAM: CT ABDOMEN AND PELVIS WITHOUT CONTRAST  TECHNIQUE: Multidetector CT imaging of the abdomen and pelvis was performed following the standard protocol without IV contrast.  COMPARISON:  CT pelvis 05/24/2015. MRI abdomen 01/08/2015. CT abdomen and pelvis 07/12/2013.  FINDINGS: Atelectasis in the lung bases. Large esophageal hiatal hernia containing most of the stomach, some small bowel, and the splenic flexure of the colon.  Postoperative changes with surgical clips in the abdomen. Likely there have been previous small bowel and possible partial large bowel resections. Remaining colon is diffusely dilated and fluid-filled. There appears to be a stricture of the left transverse colon. This has been present previously. Proximal small bowel appears dilated and fluid-filled. There appears to be stricture. No distal ileum with mild wall thickening.  This is also similar to prior study. Small and large bowel dilatation has increased since the prior study. No free or loculated fluid collections in the abdomen.  Unenhanced appearance of liver, spleen, gallbladder, pancreas, adrenal glands, kidneys, abdominal aorta, inferior vena cava, and retroperitoneal lymph nodes is unremarkable. Probable surgical absence of the gallbladder.  Pelvis: Uterus and ovaries are not enlarged. No free or loculated pelvic fluid collections. 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 There appears to be a fistula to the left of midline extending posteriorly to the skin surface.  Metallic structure or suture demonstrated at the base of the perineum. Appearance is similar to prior studies. No destructive bone lesions. Tarlov cysts in the sacrum.  IMPRESSION: Previous partial small and large bowel resections. Changes of Crohn's disease with areas of small and large bowel wall thickening and stricturing. Involve segments appear similar to prior studies. Mild large and small dilatation with fluid-filled loops, increased since previous study. This may represent progression of strictures versus dysmotility. No discrete abscess. 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 Large esophageal hiatal hernia containing most of the stomach and portions of small and large bowel.   Electronically Signed   By: Lucienne Capers M.D.   On: 07/30/2015 01:39   Dg Chest 2 View  07/29/2015   CLINICAL DATA:  Left-sided chest pain starting at noon today. Nausea, vomiting, shortness of breath, lightheadedness, and diarrhea. History of Crohn.  EXAM: CHEST  2 VIEW  COMPARISON:  07/08/2014  FINDINGS: Normal heart size and pulmonary vascularity. Large esophageal hiatal hernia behind the heart. No focal airspace disease or consolidation in the lungs. No blunting of costophrenic angles. No pneumothorax. Multiple old right rib fractures. Degenerative changes in the shoulders. Surgical clips in the right upper quadrant. No significant change since prior study.  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 No evidence of active pulmonary disease.   Electronically Signed   By: Lucienne Capers M.D.   On: 07/29/2015 20:48   Dg Abd 2 Views  07/29/2015   CLINICAL DATA:  History of Crohn disease. Vomiting, diarrhea, and epigastric pain today.  EXAM: ABDOMEN - 2 VIEW  COMPARISON:  01/26/2015  FINDINGS: Gas in the colon with paucity of gas in the small bowel. Multiple small air-fluid levels are demonstrated. Findings suggest possible small bowel obstruction with predominantly fluid-filled  loops. Surgical clips in the abdomen. No radiopaque stones. Degenerative changes in the spine and hips. No free intra-abdominal air.  IMPRESSION: Small air-fluid levels with paucity of gas in the small bowel suggest possible small bowel obstruction with predominantly fluid-filled loops.   Electronically Signed   By: Lucienne Capers M.D.   On: 07/29/2015 23:38    MDM   Final diagnoses:  None  PLan prescription Reglan, Norco, Keflex, prednisone. She is encouraged call her gastroenterologist tomorrow to schedule follow-up appointment Diagnosis #1 exacerbation of Crohn's disease #2 hypokalemia #3 urinary tract infection #4 renal insufficiency      Orlie Dakin, MD 07/30/15 [phone]

## 2015-07-29 NOTE — ED Notes (Signed)
I attempted to collect labs and was unsuccessful.  I made the nurse aware 

## 2015-07-29 NOTE — ED Notes (Addendum)
Pt is complaining of left sided chest pain that started around noon today. Pt states she was lying down when the pain started. Also, complains of nausea, vomiting, a little shortness of breath, lightheadness, and diarrhea. Pt states she normally get chest pain when her potassium is low or has a crohns flare up.

## 2015-07-29 NOTE — Telephone Encounter (Signed)
Danielle [name] said she is need a Rx for Prednisone sent to [facility] on [address]. Her return phone number is [phone].

## 2015-07-29 NOTE — Telephone Encounter (Signed)
A refill request has been sent to [place].

## 2015-07-29 NOTE — Telephone Encounter (Signed)
Danielle [name] has called in a left a message that she needs to have a refill on her Prednisone.

## 2015-07-29 NOTE — ED Notes (Signed)
Attempted to start IV x 3 without success; IV team called for consult

## 2015-07-30 ENCOUNTER — Emergency Department (HOSPITAL_COMMUNITY): Payer: Medicare Other

## 2015-07-30 DIAGNOSIS — K5090 Crohn's disease, unspecified, without complications: Secondary | ICD-10-CM | POA: Diagnosis not present

## 2015-07-30 MED ORDER — PREDNISONE 20 MG PO TABS: ORAL_TABLET | ORAL | Status: DC

## 2015-07-30 MED ORDER — METOCLOPRAMIDE HCL 5 MG/ML IJ SOLN
5.0000 mg | Freq: Once | INTRAMUSCULAR | Status: AC
  Administered 2015-07-30: 5 mg via INTRAVENOUS
  Filled 2015-07-30: qty 2

## 2015-07-30 MED ORDER — POTASSIUM CHLORIDE 20 MEQ/15ML (10%) PO SOLN
40.0000 meq | Freq: Once | ORAL | Status: AC
Start: 2015-07-30 — End: 2015-07-30
  Administered 2015-07-30: 40 meq via ORAL
  Filled 2015-07-30: qty 30

## 2015-07-30 MED ORDER — CEPHALEXIN 500 MG PO CAPS: 500.0000 mg | ORAL_CAPSULE | Freq: Three times a day (TID) | ORAL | Status: DC

## 2015-07-30 MED ORDER — HYDROCODONE-ACETAMINOPHEN 5-325 MG PO TABS: 1.0000 | ORAL_TABLET | ORAL | Status: DC | PRN

## 2015-07-30 MED ORDER — PREDNISONE 20 MG PO TABS
40.0000 mg | ORAL_TABLET | Freq: Once | ORAL | Status: AC
  Administered 2015-07-30: 40 mg via ORAL
  Filled 2015-07-30: qty 2

## 2015-07-30 MED ORDER — METOCLOPRAMIDE HCL 10 MG PO TABS: 10.0000 mg | ORAL_TABLET | Freq: Four times a day (QID) | ORAL | Status: DC | PRN

## 2015-07-30 MED ORDER — MORPHINE SULFATE (PF) 4 MG/ML IV SOLN
4.0000 mg | Freq: Once | INTRAVENOUS | Status: AC
  Administered 2015-07-30: 4 mg via INTRAVENOUS
  Filled 2015-07-30: qty 1

## 2015-07-30 MED ORDER — CEPHALEXIN 500 MG PO CAPS
500.0000 mg | ORAL_CAPSULE | Freq: Once | ORAL | Status: AC
  Administered 2015-07-30: 500 mg via ORAL
  Filled 2015-07-30: qty 1

## 2015-07-30 MED ORDER — POTASSIUM CHLORIDE CRYS ER 20 MEQ PO TBCR
40.0000 meq | EXTENDED_RELEASE_TABLET | Freq: Once | ORAL | Status: DC
  Filled 2015-07-30: qty 2

## 2015-07-30 MED ORDER — IOHEXOL 300 MG/ML  SOLN
50.0000 mL | Freq: Once | INTRAMUSCULAR | Status: AC | PRN
  Administered 2015-07-30: 50 mL via ORAL

## 2015-07-30 NOTE — ED Notes (Signed)
Pt advises that she is not going to drink anymore of the CT contrast; CT notified

## 2015-07-30 NOTE — Discharge Instructions (Signed)
Crohn Disease Call your gastroenterologist tomorrow to schedule appointment for within the next week. You've been prescribed and antibiotics for urinary infection. Urine has been sent for culture. We will call you if the antibiotics needs to be changed. Take Tylenol for mild pain or the pain medicine prescribed for bad pain. Don't take Tylenol and the pain medicine prescribed together is common and she could be dangerous. Crohn disease is a long-term (chronic) soreness and redness (inflammation) of the intestines (bowel). It can affect any portion of the digestive tract, from the mouth to the anus. It can also cause problems outside the digestive tract. Crohn disease is closely related to a disease called ulcerative colitis (together, these two diseases are called inflammatory bowel disease).  CAUSES  The cause of Crohn disease is not known. One Link Snuffer is that, in an easily affected person, the immune system is triggered to attack the body's own digestive tissue. Crohn disease runs in families. It seems to be more common in certain geographic areas and amongst certain races. There are no clear-cut dietary causes.  SYMPTOMS  Crohn disease can cause many different symptoms since it can affect many different parts of the body. Symptoms include:  Fatigue.  Weight loss.  Chronic diarrhea, sometime bloody.  Abdominal pain and cramps.  Fever.  Ulcers or canker sores in the mouth or rectum.  Anemia (low red blood cells).  Arthritis, skin problems, and eye problems may occur. Complications of Crohn disease can include:  Series of holes (perforation) of the bowel.  Portions of the intestines sticking to each other (adhesions).  Obstruction of the bowel.  Fistula formation, typically in the rectal area but also in other areas. A fistula is an opening between the bowels and the outside, or between the bowels and another organ.  A painful crack in the mucous membrane of the anus (rectal  fissure). DIAGNOSIS  Your caregiver may suspect Crohn disease based on your symptoms and an exam. Blood tests may confirm that there is a problem. You may be asked to submit a stool specimen for examination. X-rays and CT scans may be necessary. Ultimately, the diagnosis is usually made after a procedure that uses a flexible tube that is inserted via your mouth or your anus. This is done under sedation and is called either an upper endoscopy or colonoscopy. With these tests, the specialist can take tiny tissue samples and remove them from the inside of the bowel (biopsy). Examination of this biopsy tissue under a microscope can reveal Crohn disease as the cause of your symptoms. Due to the many different forms that Crohn disease can take, symptoms may be present for several years before a diagnosis is made. TREATMENT  Medications are often used to decrease inflammation and control the immune system. These include medicines related to aspirin, steroid medications, and newer and stronger medications to slow down the immune system. Some medications may be used as suppositories or enemas. A number of other medications are used or have been studied. Your caregiver will make specific recommendations. HOME CARE INSTRUCTIONS   Symptoms such as diarrhea can be controlled with medications. Avoid foods that have a laxative effect such as fresh fruit, vegetables, and dairy products. During flare-ups, you can rest your bowel by refraining from solid foods. Drink clear liquids frequently during the day. (Electrolyte or rehydrating fluids are best. Your caregiver can help you with suggestions.) Drink often to prevent loss of body fluids (dehydration). When diarrhea has cleared, eat small meals and more frequently.  Avoid food additives and stimulants such as caffeine (coffee, tea, or chocolate). Enzyme supplements may help if you develop intolerance to a sugar in dairy products (lactose). Ask your caregiver or dietitian  about specific dietary instructions.  Try to maintain a positive attitude. Learn relaxation techniques such as self-hypnosis, mental imaging, and muscle relaxation.  If possible, avoid stresses which can aggravate your condition.  Exercise regularly.  Follow your diet.  Always get plenty of rest. SEEK MEDICAL CARE IF:   Your symptoms fail to improve after a week or two of new treatment.  You experience continued weight loss.  You have ongoing cramps or loose bowels.  You develop a new skin rash, skin sores, or eye problems. SEEK IMMEDIATE MEDICAL CARE IF:   You have worsening of your symptoms or develop new symptoms.  You have a fever.  You develop bloody diarrhea.  You develop severe abdominal pain. MAKE SURE YOU:   Understand these instructions.  Will watch your condition.  Will get help right away if you are not doing well or get worse. Document Released: 09/02/2005 Document Revised: 04/09/2014 Document Reviewed: 08/01/2007 [facility] Patient Information 2015 [place], [place]. This information is not intended to replace advice given to you by your health care provider. Make sure you discuss any questions you have with your health care provider.

## 2015-07-31 LAB — URINE CULTURE: SPECIAL REQUESTS: NORMAL

## 2015-08-05 DIAGNOSIS — M25512 Pain in left shoulder: Secondary | ICD-10-CM | POA: Diagnosis not present

## 2015-08-05 DIAGNOSIS — M25511 Pain in right shoulder: Secondary | ICD-10-CM | POA: Diagnosis not present

## 2015-08-05 DIAGNOSIS — M109 Gout, unspecified: Secondary | ICD-10-CM | POA: Diagnosis not present

## 2015-08-05 DIAGNOSIS — K5090 Crohn's disease, unspecified, without complications: Secondary | ICD-10-CM | POA: Diagnosis not present

## 2015-08-05 DIAGNOSIS — M7532 Calcific tendinitis of left shoulder: Secondary | ICD-10-CM | POA: Diagnosis not present

## 2015-08-06 ENCOUNTER — Telehealth (INDEPENDENT_AMBULATORY_CARE_PROVIDER_SITE_OTHER): Payer: Self-pay | Admitting: *Deleted

## 2015-08-06 NOTE — Telephone Encounter (Signed)
Patient called asking [name] to please call her today. She is needing her to ask Dr. Laural Golden a question about her Remicade.

## 2015-08-06 NOTE — Telephone Encounter (Signed)
Would like to speak with [name] about changing her remicade. The return phone number is [phone].

## 2015-08-07 NOTE — Telephone Encounter (Signed)
Patient is requesting that her Remicade be every 6 weeks instead of every 8 weeks. Last infusion was on August 3,2016 and next one is posted for 09/04/15. She is asking if it could be on 08-15-15. Patient a has little flare up and had to go to the ED, and it was suggested that she needed to have the infusions every 6 weeks instead of 8 weeks.  Patient will be contacted with Dr.Rehman's recommendation.

## 2015-08-07 NOTE — Telephone Encounter (Signed)
Per Dr.Rehman - no objection to the patient having the infusions every 6 weeks. A new order was sent to [facility]. Note patient was last infused 07-10-15 and is currently posted for infusion 09-04-15. Patient has also ask for morning appointment.  Patient was called and made aware.

## 2015-08-08 ENCOUNTER — Other Ambulatory Visit (INDEPENDENT_AMBULATORY_CARE_PROVIDER_SITE_OTHER): Payer: Self-pay | Admitting: Internal Medicine

## 2015-08-08 DIAGNOSIS — R197 Diarrhea, unspecified: Secondary | ICD-10-CM

## 2015-08-08 NOTE — Telephone Encounter (Signed)
Rx refilled.

## 2015-08-13 ENCOUNTER — Other Ambulatory Visit (INDEPENDENT_AMBULATORY_CARE_PROVIDER_SITE_OTHER): Payer: Self-pay | Admitting: Internal Medicine

## 2015-08-14 NOTE — Telephone Encounter (Signed)
A refill has been completed by the nurse practioner.

## 2015-08-15 ENCOUNTER — Other Ambulatory Visit (INDEPENDENT_AMBULATORY_CARE_PROVIDER_SITE_OTHER): Payer: Self-pay | Admitting: *Deleted

## 2015-08-19 ENCOUNTER — Emergency Department (HOSPITAL_COMMUNITY): Payer: Medicare Other

## 2015-08-19 ENCOUNTER — Encounter (HOSPITAL_COMMUNITY): Payer: Self-pay | Admitting: Emergency Medicine

## 2015-08-19 ENCOUNTER — Emergency Department (HOSPITAL_COMMUNITY)
Admission: EM | Admit: 2015-08-19 | Discharge: 2015-08-19 | Disposition: A | Discharge status: Home / Self Care | Payer: Medicare Other | Attending: Emergency Medicine | Admitting: Emergency Medicine

## 2015-08-19 DIAGNOSIS — Z8739 Personal history of other diseases of the musculoskeletal system and connective tissue: Secondary | ICD-10-CM | POA: Diagnosis not present

## 2015-08-19 DIAGNOSIS — W108XXA Fall (on) (from) other stairs and steps, initial encounter: Secondary | ICD-10-CM | POA: Diagnosis not present

## 2015-08-19 DIAGNOSIS — Z872 Personal history of diseases of the skin and subcutaneous tissue: Secondary | ICD-10-CM | POA: Insufficient documentation

## 2015-08-19 DIAGNOSIS — Z8701 Personal history of pneumonia (recurrent): Secondary | ICD-10-CM | POA: Insufficient documentation

## 2015-08-19 DIAGNOSIS — Y998 Other external cause status: Secondary | ICD-10-CM | POA: Diagnosis not present

## 2015-08-19 DIAGNOSIS — Z862 Personal history of diseases of the blood and blood-forming organs and certain disorders involving the immune mechanism: Secondary | ICD-10-CM | POA: Insufficient documentation

## 2015-08-19 DIAGNOSIS — Z9889 Other specified postprocedural states: Secondary | ICD-10-CM | POA: Insufficient documentation

## 2015-08-19 DIAGNOSIS — Z87891 Personal history of nicotine dependence: Secondary | ICD-10-CM | POA: Diagnosis not present

## 2015-08-19 DIAGNOSIS — S72492A Other fracture of lower end of left femur, initial encounter for closed fracture: Secondary | ICD-10-CM

## 2015-08-19 DIAGNOSIS — S72422A Displaced fracture of lateral condyle of left femur, initial encounter for closed fracture: Secondary | ICD-10-CM | POA: Insufficient documentation

## 2015-08-19 DIAGNOSIS — S72412A Displaced unspecified condyle fracture of lower end of left femur, initial encounter for closed fracture: Secondary | ICD-10-CM

## 2015-08-19 DIAGNOSIS — Z792 Long term (current) use of antibiotics: Secondary | ICD-10-CM | POA: Diagnosis not present

## 2015-08-19 DIAGNOSIS — Y9289 Other specified places as the place of occurrence of the external cause: Secondary | ICD-10-CM | POA: Diagnosis not present

## 2015-08-19 DIAGNOSIS — Y9389 Activity, other specified: Secondary | ICD-10-CM | POA: Diagnosis not present

## 2015-08-19 DIAGNOSIS — S72422B Displaced fracture of lateral condyle of left femur, initial encounter for open fracture type I or II: Secondary | ICD-10-CM | POA: Diagnosis not present

## 2015-08-19 DIAGNOSIS — Z79899 Other long term (current) drug therapy: Secondary | ICD-10-CM | POA: Diagnosis not present

## 2015-08-19 DIAGNOSIS — K219 Gastro-esophageal reflux disease without esophagitis: Secondary | ICD-10-CM | POA: Diagnosis not present

## 2015-08-19 DIAGNOSIS — Z043 Encounter for examination and observation following other accident: Secondary | ICD-10-CM | POA: Diagnosis not present

## 2015-08-19 DIAGNOSIS — S8992XA Unspecified injury of left lower leg, initial encounter: Secondary | ICD-10-CM | POA: Diagnosis present

## 2015-08-19 DIAGNOSIS — M25562 Pain in left knee: Secondary | ICD-10-CM | POA: Diagnosis not present

## 2015-08-19 MED ORDER — HYDROMORPHONE HCL 1 MG/ML IJ SOLN
1.0000 mg | Freq: Once | INTRAMUSCULAR | Status: AC
  Administered 2015-08-19: 1 mg via INTRAMUSCULAR
  Filled 2015-08-19: qty 1

## 2015-08-19 MED ORDER — HYDROMORPHONE HCL 1 MG/ML IJ SOLN: 1.0000 mg | Freq: Once | INTRAMUSCULAR | Status: DC

## 2015-08-19 MED ORDER — HYDROMORPHONE HCL 1 MG/ML IJ SOLN
0.5000 mg | Freq: Once | INTRAMUSCULAR | Status: AC
  Administered 2015-08-19: 0.5 mg via INTRAMUSCULAR
  Filled 2015-08-19: qty 1

## 2015-08-19 MED ORDER — OXYCODONE-ACETAMINOPHEN 5-325 MG PO TABS: 1.0000 | ORAL_TABLET | ORAL | Status: DC | PRN

## 2015-08-19 MED ORDER — HYDROMORPHONE HCL 1 MG/ML IJ SOLN
0.7500 mg | Freq: Once | INTRAMUSCULAR | Status: AC
  Administered 2015-08-19: 0.75 mg via INTRAMUSCULAR
  Filled 2015-08-19: qty 1

## 2015-08-19 NOTE — Discharge Instructions (Signed)
Fracture A fracture is a break in a bone, due to a force on the bone that is greater than the bone's strength can handle. There are many types of fractures, including:  Complete fracture: The break passes completely through the bone.  Displaced: The ends of the bone fragments are not properly aligned.  Non-displaced: The ends of the bone fragments are in proper alignment.  Incomplete fracture (greenstick): The break does not pass completely through the bone. Incomplete fractures may or may not be angular (angulated).  Open fracture (compound): Part of the broken bone pokes through the skin. Open fractures have a high risk for infection.  Closed fracture: The fracture has not broken through the skin.  Comminuted fracture: The bone is broken into more than two pieces.  Compression fracture: The break occurs from extreme pressure on the bone (includes crushing injury).  Impacted fracture: The broken bone ends have been driven into each other.  Avulsion fracture: A ligament or tendon pulls a small piece of bone off from the main bony segment.  Pathologic fracture: A fracture due to the bone being made weak by a disease (osteoporosis or tumors).  Stress fracture: A fracture caused by intense exercise or repetitive and prolonged pressure that makes the bone weak. SYMPTOMS   Pain, tenderness, bleeding, bruising, and swelling at the fracture site.  Weakness and inability to bear weight on the injured extremity.  Paleness and deformity (sometimes).  Loss of pulse, numbness, tingling, or paralysis below the fracture site (usually a limb); these are emergencies. CAUSES  Bone being subjected to a force greater than its strength. RISK INCREASES WITH:  Contact sports and falls from heights.  Previous or current bone problems (osteoporosis or tumors).  Poor balance.  Poor strength and flexibility. PREVENTION   Warm up and stretch properly before activity.  Maintain physical  fitness:  Cardiovascular fitness.  Muscle strength.  Flexibility and endurance.  Wear proper protective equipment.  Use proper exercise technique. RELATED COMPLICATIONS   Bone fails to heal (nonunion).  Bone heals in a poor position (malunion).  Low blood volume (hypovolemic), shock due to blood loss.  Clump of fat cells travels through the blood (fat embolus) from the injury site to the lungs or brain (more common with thigh fractures).  Obstruction of nearby arteries. TREATMENT  Treatment first requires realigning of the bones (reduction) by a medically trained person, if the fracture is displaced. After realignment if the fracture is completed, or for non-displaced fractures, ice and medicine are used to reduce pain and inflammation. The bone and adjacent joints are then restrained with a splint, cast, or brace to allow the bones to heal without moving. Surgery is sometimes needed, to reposition the bones and hold the position with rods, pins, plates, or screws. Restraint for long periods of time may result in muscle and joint weakness or build up of fluid in tissues (edema). For this reason, physical therapy is often needed to regain strength and full range of motion. Recovery is complete when there is no bone motion at the fracture site and x-rays (radiographs) show complete healing.  MEDICATION   General anesthesia, sedation, or muscle relaxants may be needed to allow for realignment of the fracture. If pain medicine is needed, nonsteroidal anti-inflammatory medicines (aspirin and ibuprofen), or other minor pain relievers (acetaminophen), are often advised.  Do not take pain medicine for 7 days before surgery.  Stronger pain relievers may be prescribed by your caregiver. Use only as directed and only as much  as you need. SEEK MEDICAL CARE IF:   The following occur after restraint or surgery. (Report any of these signs immediately):  Swelling above or below the fracture  site.  Severe, persistent pain.  Blue or gray skin below the fracture site, especially under the nails. Numbness or loss of feeling below the fracture site. Document Released: 11/23/2005 Document Revised: 11/09/2012 Document Reviewed: 03/07/2009 [facility] Patient Information 2015 [employer], [place]. This information is not intended to replace advice given to you by your health care provider. Make sure you discuss any questions you have with your health care provider.

## 2015-08-19 NOTE — ED Notes (Signed)
Pt was walking down steps and missed a step landing on L knee.  Pt cannot bear weight on that limb.  Pt has chronic swelling in BLE but is increased in LLE.    BP120/72 P70 RR16

## 2015-08-19 NOTE — Care Management Note (Addendum)
Case Management Note  Patient Details  Name: Danielle [name] MRN: [id number] Date of Birth: 08-Nov-1946  Subjective/Objective:       69 yr old female medicare pt with fall backwards recently on steps Lives with her sister and nephew.  Pt has a present 3N1 she wants elevated or updated Pt wants a rollator ht 5' 2 1/2" wt 136 lbs Agreed to delivery to home Pt states she chooses [facility] for [facility] health services and DME Nephew at bedside Very pleasant patient   Action/Plan:  CM assess pt home health needs  CM reviewed in details medicare guidelines, home health [facility]) (length of stay in home, types of [facility] staff available, coverage, primary caregiver, up to 24 hrs before services may be started) and Private duty nursing (PDN-coverage, length of stay in the home types of staff available).  CM provided pt/family with a list of [place] home health agencies    Discussed the need to have conservative DMe prior to having an electric w/c 1605 Cm spoke with [place] at [facility] to provided orders for DME and [facility] services   Expected Discharge Date:   August 19 2015                Expected Discharge Plan:   home with home health services   In-House Referral:   na   Discharge planning Services    home with home health services   Post Acute Care Choice:    home with home health services  Choice offered to:   pt   DME Arranged:   3n1, rollator DME Agency[facility]   [facility]   HH Arranged:   HHRN, HHPT, [place] aide  [place] Agency:   [facility]   Status of Service:   completed   Additional Comments:  Robbie Lis, RN 08/19/2015, 4:01 PM

## 2015-08-19 NOTE — ED Notes (Signed)
Patient transported to X-ray 

## 2015-08-19 NOTE — ED Notes (Signed)
Patient transported to CT 

## 2015-08-19 NOTE — ED Notes (Signed)
Bed: WA20 Expected date:  Expected time:  Means of arrival:  Comments: 68 fall

## 2015-08-20 NOTE — Progress Notes (Signed)
Pt left voice message for CM about services CM spoke with [facility] staff

## 2015-08-21 ENCOUNTER — Encounter (HOSPITAL_COMMUNITY)
Admission: RE | Admit: 2015-08-21 | Discharge: 2015-08-21 | Disposition: A | Discharge status: Home / Self Care | Payer: Medicare Other | Source: Ambulatory Visit | Attending: Internal Medicine | Admitting: Internal Medicine

## 2015-08-21 DIAGNOSIS — K5090 Crohn's disease, unspecified, without complications: Secondary | ICD-10-CM | POA: Insufficient documentation

## 2015-08-21 MED ORDER — SODIUM CHLORIDE 0.9 % IV SOLN
300.0000 mg | Freq: Once | INTRAVENOUS | Status: AC
  Administered 2015-08-21: 300 mg via INTRAVENOUS
  Filled 2015-08-21: qty 30

## 2015-08-21 MED ORDER — LORATADINE 10 MG PO TABS
10.0000 mg | ORAL_TABLET | Freq: Once | ORAL | Status: AC
  Administered 2015-08-21: 10 mg via ORAL

## 2015-08-21 MED ORDER — SODIUM CHLORIDE 0.9 % IV SOLN
Freq: Once | INTRAVENOUS | Status: AC
  Administered 2015-08-21: 250 mL via INTRAVENOUS

## 2015-08-21 MED ORDER — ACETAMINOPHEN 325 MG PO TABS
650.0000 mg | ORAL_TABLET | Freq: Once | ORAL | Status: AC
  Administered 2015-08-21: 650 mg via ORAL
  Filled 2015-08-21: qty 2

## 2015-08-21 MED ORDER — LORATADINE 10 MG PO TABS
ORAL_TABLET | ORAL | Status: AC
Start: 2015-08-21 — End: 2015-08-21
  Filled 2015-08-21: qty 1

## 2015-08-22 DIAGNOSIS — S72412D Displaced unspecified condyle fracture of lower end of left femur, subsequent encounter for closed fracture with routine healing: Secondary | ICD-10-CM | POA: Diagnosis not present

## 2015-08-23 DIAGNOSIS — M25562 Pain in left knee: Secondary | ICD-10-CM | POA: Diagnosis not present

## 2015-08-26 DIAGNOSIS — S72412D Displaced unspecified condyle fracture of lower end of left femur, subsequent encounter for closed fracture with routine healing: Secondary | ICD-10-CM | POA: Diagnosis not present

## 2015-08-27 DIAGNOSIS — B5801 Toxoplasma chorioretinitis: Secondary | ICD-10-CM | POA: Diagnosis not present

## 2015-08-27 DIAGNOSIS — M0760 Enteropathic arthropathies, unspecified site: Secondary | ICD-10-CM | POA: Diagnosis not present

## 2015-08-27 DIAGNOSIS — H4322 Crystalline deposits in vitreous body, left eye: Secondary | ICD-10-CM | POA: Diagnosis not present

## 2015-08-29 DIAGNOSIS — H3090 Unspecified chorioretinal inflammation, unspecified eye: Secondary | ICD-10-CM | POA: Diagnosis not present

## 2015-08-29 DIAGNOSIS — M0760 Enteropathic arthropathies, unspecified site: Secondary | ICD-10-CM | POA: Diagnosis not present

## 2015-08-29 DIAGNOSIS — B5801 Toxoplasma chorioretinitis: Secondary | ICD-10-CM | POA: Diagnosis not present

## 2015-08-29 DIAGNOSIS — E119 Type 2 diabetes mellitus without complications: Secondary | ICD-10-CM | POA: Diagnosis not present

## 2015-08-29 DIAGNOSIS — D869 Sarcoidosis, unspecified: Secondary | ICD-10-CM | POA: Diagnosis not present

## 2015-08-29 DIAGNOSIS — H4322 Crystalline deposits in vitreous body, left eye: Secondary | ICD-10-CM | POA: Diagnosis not present

## 2015-08-30 DIAGNOSIS — S72412D Displaced unspecified condyle fracture of lower end of left femur, subsequent encounter for closed fracture with routine healing: Secondary | ICD-10-CM | POA: Diagnosis not present

## 2015-09-02 DIAGNOSIS — S72412D Displaced unspecified condyle fracture of lower end of left femur, subsequent encounter for closed fracture with routine healing: Secondary | ICD-10-CM | POA: Diagnosis not present

## 2015-09-03 NOTE — ED Provider Notes (Signed)
CSN: [id number]     Arrival date & time 08/19/15  1105 History   First MD Initiated Contact with Patient 08/19/15 1130     Chief Complaint  Patient presents with  . Fall     (Consider location/radiation/quality/duration/timing/severity/associated sxs/prior Treatment) HPI   32yF with L knee pain. Onset just before arrival. Was stepping down steps and stepped ackwardly. Denies actual fall to me. Denies significant pain anywhere else. No numbness or tingling. Has been unable to bear weight on leg since. No intervention prior to arrival.   Past Medical History  Diagnosis Date  . Crohn's disease   . Chronic diarrhea   . GERD (gastroesophageal reflux disease)   . Polyarthralgia   . Hiatal hernia   . Hemorrhoids   . Anemia   . Hiatal hernia     hiatal  . Pneumonia 2014  . Gout   . Psoriasis    Past Surgical History  Procedure Laterality Date  . Bowel resection  2009  . 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 Anal stenosis  ~ 2010  . Colonoscopy  2013  . Cholecystectomy  1990's  . Appendectomy  1980's  . Goiter  1999  . Esophagogastroduodenoscopy  11/11/2011    Procedure: ESOPHAGOGASTRODUODENOSCOPY (EGD);  Surgeon: Missy Sabins, MD;  [facility] END[facility]COPY;  Service: Endoscopy;  Laterality: N/A;  . Flexible sigmoidoscopy  11/11/2011    Procedure: FLEXIBLE SIGMOIDOSCOPY;  Surgeon: Missy Sabins, MD;  Location: [place];  Service: Endoscopy;  Laterality: N/A;  . Esophagogastroduodenoscopy  05/18/2012    Procedure: ESOPHAGOGASTRODUODENOSCOPY (EGD);  Surgeon: Rogene Houston, MD;  Location: [facility] ENDO SUITE;  Service: Endoscopy;  Laterality: N/A;  200  . Colostomy takedown  2009    "only had it for about 1 month" (08/22/2013)  . Left heart catheterization with coronary angiogram N/A 07/09/2014    Procedure: LEFT HEART CATHETERIZATION WITH CORONARY ANGIOGRAM;  Surgeon: Peter M Martinique, MD;  Location: [facility] CATH LAB;  Service: Cardiovascular;  Laterality: N/A;  . Proctoscopy N/A 01/31/2015     Procedure: RIGID PROCTOSCOPY;  Surgeon: Leighton Ruff, MD;  Location: [place];  Service: General;  Laterality: N/A;  . Rectal exam under anesthesia N/A 01/31/2015    Procedure: [place] OF A SETON;  Surgeon: Leighton Ruff, MD;  Location: [facility] OR;  Service: General;  Laterality: N/A;   Family History  Problem Relation Age of Onset  . Diabetes Mother   . Healthy Sister   . Hypertension Brother   . Colon cancer Brother     died with colon cancer  . Heart disease Father   . Crohn's disease Paternal Aunt    Social History  Substance Use Topics  . Smoking status: Former Smoker -- 0.12 packs/day for 4 years    Types: Cigarettes    Quit date: 10/05/1996  . Smokeless tobacco: Never Used     Comment: 08/22/2013 1 pack every two weeks when she did smoked  . Alcohol Use: No   OB History    No data available     Review of Systems  All systems reviewed and negative, other than as noted in HPI.   Allergies  Zithromax; Aspirin; Ciprofloxacin; Ibuprofen; and Sulfa antibiotics  Home Medications   Prior to Admission medications   Medication Sig Start Date End Date Taking? Authorizing Provider  acetaminophen (TYLENOL) 500 MG tablet Take 1,000 mg by mouth every 6 (six) hours as needed for mild pain or moderate pain.  Yes Historical Provider, MD  albuterol (PROVENTIL HFA;VENTOLIN HFA) 108 (90 BASE) MCG/ACT inhaler Inhale 2 puffs into the lungs every 6 (six) hours as needed for wheezing. 02/27/13  Yes Nishant Dhungel, MD  Calcium Carbonate-Vitamin D (CALCIUM + D PO) Take 1 tablet by mouth daily.   Yes Historical Provider, MD  Cyanocobalamin (VITAMIN B-12 IJ) Inject 1 each as directed every 30 (thirty) days. Around the 1st week of each month   Yes Historical Provider, MD  dicyclomine (BENTYL) 10 MG/5ML syrup TAKE 2 TEASPOONFUL BY MOUTH 3 TIMES A DAY BEFORE MEALS 07/16/15  Yes Rogene Houston, MD  diphenhydrAMINE (BENADRYL) 25 mg capsule Take 25 mg by mouth every 6 (six)  hours as needed for itching. 03/22/14  Yes Adeline C Viyuoh, MD  diphenoxylate-atropine (LOMOTIL) 2.5-0.025 MG per tablet TAKE 1 TABLET BY MOUTH 4 TIMES A DAY AS NEEDED FOR DIARHEA AND LOOSE STOOL 08/08/15  Yes Butch Penny, NP  furosemide (LASIX) 40 MG tablet Take 80 mg by mouth daily as needed for fluid or edema.    Yes Historical Provider, MD  HYDROcodone-acetaminophen (NORCO) 5-325 MG per tablet Take 1-2 tablets by mouth every 4 (four) hours as needed for severe pain. 07/30/15  Yes Orlie Dakin, MD  HYDROcodone-acetaminophen (NORCO) 7.5-325 MG per tablet Take 1 tablet by mouth every 4 (four) hours as needed for moderate pain.  07/20/15  Yes Historical Provider, MD  inFLIXimab (REMICADE) 100 MG injection Inject 100 mg into the vein every 6 (six) weeks.   Yes Historical Provider, MD  Multiple Vitamin (MULTIVITAMIN WITH MINERALS) TABS Take 1 tablet by mouth daily.   Yes Historical Provider, MD  nystatin (MYCOSTATIN/NYSTOP) 100000 UNIT/GM POWD APPLY 3 TIMES DAILY TO AFFECTED AREA 04/01/15  Yes Rogene Houston, MD  ondansetron (ZOFRAN) 4 MG tablet TAKE 1 TABLET BY MOUTH TWICE A DAY AS NEEDED FOR NAUSEA AND VOMITING 07/23/15  Yes Rogene Houston, MD  pantoprazole (PROTONIX) 40 MG tablet Take 1 tablet (40 mg total) by mouth 2 (two) times daily before a meal. 01/17/15  Yes Butch Penny, NP  potassium chloride 20 MEQ/15ML (10%) SOLN Take 15 mLs (20 mEq total) by mouth 2 (two) times daily. 07/16/15  Yes Rogene Houston, MD  predniSONE (DELTASONE) 20 MG tablet TAKE 2 TABLETS BY MOUTH EVERY DAY FOR 5 DAYS 08/14/15  Yes Butch Penny, NP  spironolactone (ALDACTONE) 50 MG tablet Take 1 tablet (50 mg total) by mouth 2 (two) times daily. Patient taking differently: Take 50 mg by mouth once.  02/18/15  Yes Rogene Houston, MD  zolpidem (AMBIEN) 10 MG tablet Take 10 mg by mouth at bedtime as needed for sleep.   Yes Historical Provider, MD  cephALEXin (KEFLEX) 500 MG capsule Take 1 capsule (500 mg total) by mouth 3  (three) times daily. 07/30/15   Orlie Dakin, MD  metoCLOPramide (REGLAN) 10 MG tablet Take 1 tablet (10 mg total) by mouth every 6 (six) hours as needed for nausea (nausea/headache). 07/30/15   Orlie Dakin, MD  oxyCODONE-acetaminophen (PERCOCET/ROXICET) 5-325 MG per tablet Take 1-2 tablets by mouth every 4 (four) hours as needed for severe pain. 08/19/15   Virgel Manifold, MD   BP 108/57 mmHg  Pulse 85  Temp(Src) 98.1 F (36.7 C) (Oral)  Resp 18  SpO2 100% Physical Exam  Constitutional: She appears well-developed and well-nourished. No distress.  HENT:  Head: Normocephalic and atraumatic.  Eyes: Conjunctivae are normal. Right eye exhibits no discharge. Left eye exhibits no discharge.  Neck: Neck supple.  Cardiovascular: Normal rate, regular rhythm and normal heart sounds.  Exam reveals no gallop and no friction rub.   No murmur heard. Pulmonary/Chest: Effort normal and breath sounds normal. No respiratory distress.  Abdominal: Soft. She exhibits no distension. There is no tenderness.  Musculoskeletal: She exhibits tenderness.  TTP anterolateral L knee. Mild-moderate soft tissue swelling. Can range although with increased pain. Closed injury. NVI.   Neurological: She is alert.  Skin: Skin is warm and dry.  Psychiatric: She has a normal mood and affect. Her behavior is normal. Thought content normal.  Nursing note and vitals reviewed.   ED Course  Procedures (including critical care time) Labs Review Labs Reviewed - No data to display  Imaging Review No results found.   Ct Knee Left Wo Contrast  08/19/2015   CLINICAL DATA:  Left knee pain and swelling.  Abnormal radiograph.  EXAM: CT OF THE LEFT KNEE WITHOUT CONTRAST  TECHNIQUE: Multidetector CT imaging of the left knee was performed according to the standard protocol. Multiplanar CT image reconstructions were also generated.  COMPARISON:  Radiographs dated 08/19/2015  FINDINGS: There is subtle fracture of the lateral aspect of  the lateral femoral condyle including an avulsion of bone just above the popliteus tendon origin. There is no fracture of the articular surface of the femur. The tibia and fibula are intact. 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 There is a large hemarthrosis. Patella is intact. Cruciate and collateral ligaments are intact. Distal quadriceps tendon and patellar tendon are normal.  IMPRESSION: Subtle avulsion fracture and impaction fracture of the lateral femoral condyle. The articular surface is not involved.  Prominent hemarthrosis.  Prominent osteopenia.   Electronically Signed   By: Lorriane Shire M.D.   On: 08/19/2015 13:58   Dg Knee Complete 4 Views Left  08/19/2015   CLINICAL DATA:  Patient missed a step at home this morning with hyperflexion of the left knee.  EXAM: LEFT KNEE - COMPLETE 4+ VIEW  COMPARISON:  None.  FINDINGS: There is fluid fluid level in the knee joint. There is question cortical discontinuity in the lateral aspect of the distal femur. There is generalized osteopenia of the visualized bones.  IMPRESSION: Fluid fluid level of the knee joint and question cortical discontinuity in the lateral distal aspect of the femur suggesting subtle fracture.   Electronically Signed   By: Abelardo Diesel M.D.   On: 08/19/2015 12:27   I have personally reviewed and evaluated these images and lab results as part of my medical decision-making.   EKG Interpretation None      MDM   Final diagnoses:  Avulsion fracture of femoral condyle, left, closed, initial encounter    68yF with avulsion/impaction fractures of lateral femoral condyle. Assistive walking devices. PRN pain meds. Ortho FU.     Virgel Manifold, MD 09/03/15 [phone]

## 2015-09-04 ENCOUNTER — Encounter (HOSPITAL_COMMUNITY): Payer: Medicare Other

## 2015-09-06 DIAGNOSIS — M25562 Pain in left knee: Secondary | ICD-10-CM | POA: Diagnosis not present

## 2015-09-09 ENCOUNTER — Other Ambulatory Visit (INDEPENDENT_AMBULATORY_CARE_PROVIDER_SITE_OTHER): Payer: Self-pay | Admitting: Internal Medicine

## 2015-09-09 DIAGNOSIS — N823 Fistula of vagina to large intestine: Secondary | ICD-10-CM | POA: Diagnosis not present

## 2015-09-11 ENCOUNTER — Other Ambulatory Visit (INDEPENDENT_AMBULATORY_CARE_PROVIDER_SITE_OTHER): Payer: Self-pay | Admitting: Internal Medicine

## 2015-09-12 ENCOUNTER — Other Ambulatory Visit (INDEPENDENT_AMBULATORY_CARE_PROVIDER_SITE_OTHER): Payer: Self-pay | Admitting: Internal Medicine

## 2015-09-16 ENCOUNTER — Ambulatory Visit (INDEPENDENT_AMBULATORY_CARE_PROVIDER_SITE_OTHER): Payer: Medicare Other | Admitting: Internal Medicine

## 2015-09-16 ENCOUNTER — Encounter (INDEPENDENT_AMBULATORY_CARE_PROVIDER_SITE_OTHER): Payer: Self-pay | Admitting: Internal Medicine

## 2015-09-16 VITALS — BP 100/66 | HR 60 | Temp 97.1°F | Resp 18 | Ht 64.5 in | Wt 131.9 lb

## 2015-09-16 DIAGNOSIS — K219 Gastro-esophageal reflux disease without esophagitis: Secondary | ICD-10-CM

## 2015-09-16 DIAGNOSIS — Z8639 Personal history of other endocrine, nutritional and metabolic disease: Secondary | ICD-10-CM

## 2015-09-16 DIAGNOSIS — K50919 Crohn's disease, unspecified, with unspecified complications: Secondary | ICD-10-CM | POA: Diagnosis not present

## 2015-09-16 DIAGNOSIS — R197 Diarrhea, unspecified: Secondary | ICD-10-CM

## 2015-09-16 DIAGNOSIS — D649 Anemia, unspecified: Secondary | ICD-10-CM

## 2015-09-16 MED ORDER — BENEFIBER DRINK MIX PO PACK: 4.0000 g | PACK | Freq: Every day | ORAL | Status: DC

## 2015-09-16 MED ORDER — DIPHENOXYLATE-ATROPINE 2.5-0.025 MG PO TABS: 2.0000 | ORAL_TABLET | Freq: Two times a day (BID) | ORAL | Status: DC

## 2015-09-16 NOTE — Patient Instructions (Signed)
Benefiber 4 g by mouth daily at bedtime. Take Lomotil or diphenoxylate 2 tablets before breakfast and 2 tablet before lunch mouth every day. Physician will call with results of blood tests when completed.

## 2015-09-16 NOTE — Progress Notes (Signed)
Presenting complaint;  Follow-up for Crohn's disease GERD anemia and hypokalemia.  Subjective:  Danielle [name] is 69 year old African-American female who is here for usual visit. She was last seen on 03/25/2015. She remains on infliximab infusion every 6 weeks. 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 Now she complains of diarrhea. She has 4 loose stools every day. She says if she did not take dicyclomine Lomotil she will have many more bowel movements. She denies melena or rectal bleeding or incontinence. She has occasional nocturnal bowel movement. She states her appetite is good. However she has lost 7 pounds in the last 6 months. She says she is watching her diet very closely and rarely has heartburn or nausea. She fell 1 month ago a sustained fractured her left patella and treated with knee brace. She is under care of Dr. Percell Miller. She also is under care of Dr. Charlestine Night and had cortisone shot to right shoulder. Last infliximab dose thousand 08/19/2015. She does not use inhaler on daily basis. She has not taken furosemide and last several weeks. She had negative PPD earlier this year.    Current Medications: Outpatient Encounter Prescriptions as of 09/16/2015  Medication Sig  . acetaminophen (TYLENOL) 500 MG tablet Take 1,000 mg by mouth every 6 (six) hours as needed for mild pain or moderate pain.  [name] albuterol (PROVENTIL HFA;VENTOLIN HFA) 108 (90 BASE) MCG/ACT inhaler Inhale 2 puffs into the lungs every 6 (six) hours as needed for wheezing.  . Calcium Carbonate-Vitamin D (CALCIUM + D PO) Take 1 tablet by mouth daily.  . Cyanocobalamin (VITAMIN B-12 IJ) Inject 1 each as directed every 30 (thirty) days. Around the 1st week of each month  . cyclopentolate (CYCLODRYL,CYCLOGYL) 1 % ophthalmic solution INSTILL 1 DROP IN LEFT EYE THREE TIMES A DAY  . dicyclomine (BENTYL) 10 MG/5ML syrup TAKE 2 TEASPOONFUL BY MOUTH 3 TIMES A DAY BEFORE MEALS  .  diphenhydrAMINE (BENADRYL) 25 mg capsule Take 25 mg by mouth every 6 (six) hours as needed for itching.  . diphenoxylate-atropine (LOMOTIL) 2.5-0.025 MG tablet TAKE 1 TABLET BY MOUTH 4 TIMES A DAY AS NEEDED FOR DIARHEA AND LOOSE STOOL  . HYDROcodone-acetaminophen (NORCO) 7.5-325 MG per tablet Take 1 tablet by mouth every 4 (four) hours as needed for moderate pain.   [name] inFLIXimab (REMICADE) 100 MG injection Inject 100 mg into the vein every 6 (six) weeks.  . Multiple Vitamin (MULTIVITAMIN WITH MINERALS) TABS Take 1 tablet by mouth daily.  . ondansetron (ZOFRAN) 4 MG tablet TAKE 1 TABLET BY MOUTH TWICE A DAY AS NEEDED FOR NAUSEA AND VOMITING  . pantoprazole (PROTONIX) 40 MG tablet Take 1 tablet (40 mg total) by mouth 2 (two) times daily before a meal.  . potassium chloride 20 MEQ/15ML (10%) SOLN Take 15 mLs (20 mEq total) by mouth 2 (two) times daily.  . prednisoLONE acetate (PRED FORTE) 1 % ophthalmic suspension INSTILL 1 DROP INTO LEFT EYE 4 TIMES A DAY  . predniSONE (DELTASONE) 20 MG tablet Take 0.5 tablets (10 mg total) by mouth daily with breakfast. Use as directed.  [name] spironolactone (ALDACTONE) 50 MG tablet Take 1 tablet (50 mg total) by mouth 2 (two) times daily. (Patient taking differently: Take 50 mg by mouth once. )  . [DISCONTINUED] cephALEXin (KEFLEX) 500 MG capsule Take 1 capsule (500 mg total) by mouth 3 (three) times daily. (Patient not taking: Reported on 09/16/2015)  . [DISCONTINUED] furosemide (LASIX) 40 MG tablet Take 80 mg  by mouth daily as needed for fluid or edema.   . [DISCONTINUED] HYDROcodone-acetaminophen (NORCO) 5-325 MG per tablet Take 1-2 tablets by mouth every 4 (four) hours as needed for severe pain. (Patient not taking: Reported on 09/16/2015)  . [DISCONTINUED] metoCLOPramide (REGLAN) 10 MG tablet Take 1 tablet (10 mg total) by mouth every 6 (six) hours as needed for nausea (nausea/headache). (Patient not taking: Reported on 09/16/2015)  . [DISCONTINUED] nystatin  (MYCOSTATIN/NYSTOP) 100000 UNIT/GM POWD APPLY 3 TIMES DAILY TO AFFECTED AREA (Patient not taking: Reported on 09/16/2015)  . [DISCONTINUED] oxyCODONE-acetaminophen (PERCOCET/ROXICET) 5-325 MG per tablet Take 1-2 tablets by mouth every 4 (four) hours as needed for severe pain. (Patient not taking: Reported on 09/16/2015)  . [DISCONTINUED] zolpidem (AMBIEN) 10 MG tablet Take 10 mg by mouth at bedtime as needed for sleep.   No facility-administered encounter medications on file as of 09/16/2015.    Objective: Blood pressure 100/66, pulse 60, temperature 97.1 F (36.2 C), temperature source Oral, resp. rate 18, height 5' 4.5" (1.638 m), weight 131 lb 14.4 oz (59.829 kg). Patient is alert and in no acute distress. Conjunctiva is pink. Sclera is nonicteric Oropharyngeal mucosa is normal. She has dentures in place. No neck masses or thyromegaly noted. Cardiac exam with regular rhythm normal S1 and S2. No murmur or gallop noted. Lungs are clear to auscultation. Abdomen is symmetrical. Bowel sounds are normal. On palpation abdomen is soft and nontender without organomegaly or masses.  She is waiting left knee brace. She has 1+ pitting edema around left ankle. Calf is not tender.  Labs/studies Results: Lab data from 07/29/2015  Bilirubin 0.5, AP 98, AST 27, ALT 12 and total protein 7.3 with albumen of 3.2.  Serum sodium 139, potassium 2.7, chloride 116, CO2 15, BUN 7 and creatinine 1.41.   WBC 13.8, H&H 10.4 and 33.6. Platelet count 440K  Assessment:  #1. Ileal and anorectal Crohn's disease. She remains on infliximab for maintenance therapy. She has diarrhea but no other symptoms to suggest relapse of her disease. Over the last year she's had multiple stool studies including GI pathogen panel twice and was negative. She does not appear to be acutely ill. She has not noted any relief with Questran. #2. GERD. She is doing well with single dose of PPI. She has known large hiatal hernia. #3. Anemia.  Anemia as felt to be multifactorial. #4. History of hypokalemia.   Plan:  Patient will go to lab for CBC, CRP and comprehensive chemistry panel. Diphenoxylate 2 tablets by mouth twice a day. Benefiber 4 g by mouth daily at bedtime. Will review lab studies and determine timing of her next colonoscopy.

## 2015-09-17 DIAGNOSIS — H4322 Crystalline deposits in vitreous body, left eye: Secondary | ICD-10-CM | POA: Diagnosis not present

## 2015-09-17 DIAGNOSIS — B5801 Toxoplasma chorioretinitis: Secondary | ICD-10-CM | POA: Diagnosis not present

## 2015-09-23 DIAGNOSIS — D510 Vitamin B12 deficiency anemia due to intrinsic factor deficiency: Secondary | ICD-10-CM | POA: Diagnosis not present

## 2015-09-23 DIAGNOSIS — K219 Gastro-esophageal reflux disease without esophagitis: Secondary | ICD-10-CM | POA: Diagnosis not present

## 2015-09-23 DIAGNOSIS — Z6823 Body mass index (BMI) 23.0-23.9, adult: Secondary | ICD-10-CM | POA: Diagnosis not present

## 2015-09-23 DIAGNOSIS — K5090 Crohn's disease, unspecified, without complications: Secondary | ICD-10-CM | POA: Diagnosis not present

## 2015-09-26 DIAGNOSIS — B5801 Toxoplasma chorioretinitis: Secondary | ICD-10-CM | POA: Diagnosis not present

## 2015-09-26 DIAGNOSIS — H353131 Nonexudative age-related macular degeneration, bilateral, early dry stage: Secondary | ICD-10-CM | POA: Diagnosis not present

## 2015-09-26 DIAGNOSIS — H25813 Combined forms of age-related cataract, bilateral: Secondary | ICD-10-CM | POA: Diagnosis not present

## 2015-09-30 DIAGNOSIS — K5090 Crohn's disease, unspecified, without complications: Secondary | ICD-10-CM | POA: Diagnosis not present

## 2015-09-30 DIAGNOSIS — D649 Anemia, unspecified: Secondary | ICD-10-CM | POA: Diagnosis not present

## 2015-09-30 DIAGNOSIS — Z8639 Personal history of other endocrine, nutritional and metabolic disease: Secondary | ICD-10-CM | POA: Diagnosis not present

## 2015-09-30 DIAGNOSIS — K50919 Crohn's disease, unspecified, with unspecified complications: Secondary | ICD-10-CM | POA: Diagnosis not present

## 2015-09-30 DIAGNOSIS — M25562 Pain in left knee: Secondary | ICD-10-CM | POA: Diagnosis not present

## 2015-09-30 DIAGNOSIS — R197 Diarrhea, unspecified: Secondary | ICD-10-CM | POA: Diagnosis not present

## 2015-10-01 ENCOUNTER — Telehealth (INDEPENDENT_AMBULATORY_CARE_PROVIDER_SITE_OTHER): Payer: Self-pay | Admitting: *Deleted

## 2015-10-01 DIAGNOSIS — E876 Hypokalemia: Secondary | ICD-10-CM

## 2015-10-01 LAB — COMPREHENSIVE METABOLIC PANEL
ALBUMIN: 3 g/dL — AB (ref 3.6–5.1)
ALK PHOS: 107 U/L (ref 33–130)
ALT: 8 U/L (ref 6–29)
AST: 11 U/L (ref 10–35)
BUN: 8 mg/dL (ref 7–25)
CALCIUM: 7.9 mg/dL — AB (ref 8.6–10.4)
CO2: 18 mmol/L — AB (ref 20–31)
Chloride: 106 mmol/L (ref 98–110)
Creat: 1.99 mg/dL — ABNORMAL HIGH (ref 0.50–0.99)
GLUCOSE: 52 mg/dL — AB (ref 65–99)
POTASSIUM: 2.4 mmol/L — AB (ref 3.5–5.3)
Sodium: 135 mmol/L (ref 135–146)
Total Bilirubin: 0.3 mg/dL (ref 0.2–1.2)
Total Protein: 6.4 g/dL (ref 6.1–8.1)

## 2015-10-01 LAB — CBC
HEMATOCRIT: 30.7 % — AB (ref 36.0–46.0)
Hemoglobin: 9.5 g/dL — ABNORMAL LOW (ref 12.0–15.0)
MCH: 24.9 pg — ABNORMAL LOW (ref 26.0–34.0)
MCHC: 30.9 g/dL (ref 30.0–36.0)
MCV: 80.4 fL (ref 78.0–100.0)
MPV: 9.1 fL (ref 8.6–12.4)
Platelets: 363 10*3/uL (ref 150–400)
RBC: 3.82 MIL/uL — ABNORMAL LOW (ref 3.87–5.11)
RDW: 16.5 % — AB (ref 11.5–15.5)
WBC: 15.3 10*3/uL — ABNORMAL HIGH (ref 4.0–10.5)

## 2015-10-01 LAB — C-REACTIVE PROTEIN: CRP: 3.2 mg/dL — ABNORMAL HIGH (ref <0.60)

## 2015-10-01 NOTE — Telephone Encounter (Signed)
Recd a call from [place] with a critical lab result. K+ - 2.4 CL. Per Dr.Rehman patient should go to the hospital Ed.  Patient was called and given instructions. She states that she can bring it up faster than hospital , and she doesn't have a way there. Also , she states that she is coming for her Remicade  tomorrow.  Per Dr.Rehman she is to take K+ 40 mEq by mouth three times daily and have her K+ rechecked. Patient states that she will have this done at the time of her Remicade infusion tomorrow.

## 2015-10-02 ENCOUNTER — Encounter (HOSPITAL_COMMUNITY)
Admission: RE | Admit: 2015-10-02 | Discharge: 2015-10-02 | Disposition: A | Discharge status: Home / Self Care | Payer: Medicare Other | Source: Ambulatory Visit | Attending: Internal Medicine | Admitting: Internal Medicine

## 2015-10-02 DIAGNOSIS — K5090 Crohn's disease, unspecified, without complications: Secondary | ICD-10-CM | POA: Insufficient documentation

## 2015-10-02 LAB — COMPREHENSIVE METABOLIC PANEL
ALBUMIN: 2.9 g/dL — AB (ref 3.5–5.0)
ALT: 12 U/L — ABNORMAL LOW (ref 14–54)
ANION GAP: 7 (ref 5–15)
AST: 17 U/L (ref 15–41)
Alkaline Phosphatase: 100 U/L (ref 38–126)
BUN: 14 mg/dL (ref 6–20)
CHLORIDE: 115 mmol/L — AB (ref 101–111)
CO2: 17 mmol/L — ABNORMAL LOW (ref 22–32)
Calcium: 7.6 mg/dL — ABNORMAL LOW (ref 8.9–10.3)
Creatinine, Ser: 1.51 mg/dL — ABNORMAL HIGH (ref 0.44–1.00)
GFR calc Af Amer: 40 mL/min — ABNORMAL LOW (ref >60)
GFR, EST NON AFRICAN AMERICAN: 35 mL/min — AB (ref >60)
GLUCOSE: 85 mg/dL (ref 65–99)
POTASSIUM: 2.1 mmol/L — AB (ref 3.5–5.1)
Sodium: 139 mmol/L (ref 135–145)
Total Bilirubin: 0.2 mg/dL — ABNORMAL LOW (ref 0.3–1.2)
Total Protein: 6.8 g/dL (ref 6.5–8.1)

## 2015-10-02 LAB — MAGNESIUM: MAGNESIUM: 1.3 mg/dL — AB (ref 1.7–2.4)

## 2015-10-02 MED ORDER — ACETAMINOPHEN 325 MG PO TABS
650.0000 mg | ORAL_TABLET | Freq: Once | ORAL | Status: AC
  Administered 2015-10-02: 650 mg via ORAL
  Filled 2015-10-02: qty 2

## 2015-10-02 MED ORDER — SODIUM CHLORIDE 0.9 % IV SOLN
Freq: Once | INTRAVENOUS | Status: AC
  Administered 2015-10-02: 11:00:00 via INTRAVENOUS

## 2015-10-02 MED ORDER — POTASSIUM CHLORIDE 20 MEQ/15ML (10%) PO SOLN
40.0000 meq | Freq: Two times a day (BID) | ORAL | Status: DC
  Administered 2015-10-02: 40 meq via ORAL
  Filled 2015-10-02: qty 30

## 2015-10-02 MED ORDER — POTASSIUM CHLORIDE 10 MEQ/100ML IV SOLN
10.0000 meq | INTRAVENOUS | Status: AC
  Filled 2015-10-02: qty 100

## 2015-10-02 MED ORDER — SODIUM CHLORIDE 0.9 % IV SOLN
5.0000 mg/kg | Freq: Once | INTRAVENOUS | Status: AC
  Administered 2015-10-02: 300 mg via INTRAVENOUS
  Filled 2015-10-02: qty 30

## 2015-10-02 MED ORDER — POTASSIUM CHLORIDE CRYS ER 20 MEQ PO TBCR: 40.0000 meq | EXTENDED_RELEASE_TABLET | Freq: Two times a day (BID) | ORAL | Status: DC

## 2015-10-02 MED ORDER — LORATADINE 10 MG PO TABS
10.0000 mg | ORAL_TABLET | Freq: Once | ORAL | Status: AC
  Administered 2015-10-02: 10 mg via ORAL
  Filled 2015-10-02: qty 1

## 2015-10-02 NOTE — Progress Notes (Signed)
remicaid finish with no further incident. Began pottasium runs patient complained was burning hand real bad slow down and still burning. Stopped runs and made dr Laural Golden aware. He will be over to see patient.

## 2015-10-02 NOTE — Progress Notes (Signed)
Patient was having mild reaction funny feeling in legs. Slow drip down to 80 from 150 after 30 mins patient said feeling better increased back to 150cc /hr after 30 mins  patient still feeling ok. Going to leave drip at 150cc till completion.

## 2015-10-07 ENCOUNTER — Telehealth (INDEPENDENT_AMBULATORY_CARE_PROVIDER_SITE_OTHER): Payer: Self-pay | Admitting: *Deleted

## 2015-10-07 DIAGNOSIS — E876 Hypokalemia: Secondary | ICD-10-CM

## 2015-10-07 DIAGNOSIS — K50918 Crohn's disease, unspecified, with other complication: Secondary | ICD-10-CM | POA: Diagnosis not present

## 2015-10-07 NOTE — Telephone Encounter (Signed)
Patient to have B-Met the week of November 1st, noted for 10/10/15.

## 2015-10-08 LAB — BASIC METABOLIC PANEL
BUN: 11 mg/dL (ref 7–25)
CALCIUM: 8.5 mg/dL — AB (ref 8.6–10.4)
CO2: 15 mmol/L — AB (ref 20–31)
CREATININE: 1.23 mg/dL — AB (ref 0.50–0.99)
Chloride: 113 mmol/L — ABNORMAL HIGH (ref 98–110)
GLUCOSE: 79 mg/dL (ref 65–99)
Potassium: 3.5 mmol/L (ref 3.5–5.3)
SODIUM: 140 mmol/L (ref 135–146)

## 2015-10-09 ENCOUNTER — Other Ambulatory Visit (INDEPENDENT_AMBULATORY_CARE_PROVIDER_SITE_OTHER): Payer: Self-pay | Admitting: Internal Medicine

## 2015-10-09 DIAGNOSIS — H4322 Crystalline deposits in vitreous body, left eye: Secondary | ICD-10-CM | POA: Diagnosis not present

## 2015-10-09 DIAGNOSIS — B5801 Toxoplasma chorioretinitis: Secondary | ICD-10-CM | POA: Diagnosis not present

## 2015-10-10 ENCOUNTER — Telehealth (INDEPENDENT_AMBULATORY_CARE_PROVIDER_SITE_OTHER): Payer: Self-pay | Admitting: *Deleted

## 2015-10-10 ENCOUNTER — Encounter (INDEPENDENT_AMBULATORY_CARE_PROVIDER_SITE_OTHER): Payer: Self-pay | Admitting: *Deleted

## 2015-10-10 DIAGNOSIS — K50118 Crohn's disease of large intestine with other complication: Secondary | ICD-10-CM

## 2015-10-10 DIAGNOSIS — D509 Iron deficiency anemia, unspecified: Secondary | ICD-10-CM

## 2015-10-10 DIAGNOSIS — E876 Hypokalemia: Secondary | ICD-10-CM

## 2015-10-10 NOTE — Telephone Encounter (Signed)
Per Dr.Rehman the patient will need to have labs drawn in 3 weeks. 

## 2015-10-12 ENCOUNTER — Other Ambulatory Visit (INDEPENDENT_AMBULATORY_CARE_PROVIDER_SITE_OTHER): Payer: Self-pay | Admitting: Internal Medicine

## 2015-10-28 DIAGNOSIS — B5801 Toxoplasma chorioretinitis: Secondary | ICD-10-CM | POA: Diagnosis not present

## 2015-11-05 DIAGNOSIS — H353131 Nonexudative age-related macular degeneration, bilateral, early dry stage: Secondary | ICD-10-CM | POA: Diagnosis not present

## 2015-11-06 DIAGNOSIS — D509 Iron deficiency anemia, unspecified: Secondary | ICD-10-CM | POA: Diagnosis not present

## 2015-11-06 DIAGNOSIS — E876 Hypokalemia: Secondary | ICD-10-CM | POA: Diagnosis not present

## 2015-11-06 DIAGNOSIS — K50118 Crohn's disease of large intestine with other complication: Secondary | ICD-10-CM | POA: Diagnosis not present

## 2015-11-07 LAB — CBC
HEMATOCRIT: 29.2 % — AB (ref 36.0–46.0)
HEMOGLOBIN: 9.2 g/dL — AB (ref 12.0–15.0)
MCH: 25.3 pg — ABNORMAL LOW (ref 26.0–34.0)
MCHC: 31.5 g/dL (ref 30.0–36.0)
MCV: 80.2 fL (ref 78.0–100.0)
MPV: 9 fL (ref 8.6–12.4)
Platelets: 382 10*3/uL (ref 150–400)
RBC: 3.64 MIL/uL — AB (ref 3.87–5.11)
RDW: 17.8 % — ABNORMAL HIGH (ref 11.5–15.5)
WBC: 16 10*3/uL — ABNORMAL HIGH (ref 4.0–10.5)

## 2015-11-07 LAB — BASIC METABOLIC PANEL
BUN: 29 mg/dL — AB (ref 7–25)
CHLORIDE: 107 mmol/L (ref 98–110)
CO2: 16 mmol/L — AB (ref 20–31)
CREATININE: 1.74 mg/dL — AB (ref 0.50–0.99)
Calcium: 8.3 mg/dL — ABNORMAL LOW (ref 8.6–10.4)
Glucose, Bld: 62 mg/dL — ABNORMAL LOW (ref 65–99)
Potassium: 3.6 mmol/L (ref 3.5–5.3)
Sodium: 134 mmol/L — ABNORMAL LOW (ref 135–146)

## 2015-11-07 LAB — MAGNESIUM: Magnesium: 1.3 mg/dL — ABNORMAL LOW (ref 1.5–2.5)

## 2015-11-09 ENCOUNTER — Other Ambulatory Visit (INDEPENDENT_AMBULATORY_CARE_PROVIDER_SITE_OTHER): Payer: Self-pay | Admitting: Internal Medicine

## 2015-11-09 MED ORDER — CIPROFLOXACIN HCL 500 MG PO TABS: 500.0000 mg | ORAL_TABLET | Freq: Two times a day (BID) | ORAL | Status: DC

## 2015-11-09 MED ORDER — MAGNESIUM OXIDE 400 (241.3 MG) MG PO TABS: 400.0000 mg | ORAL_TABLET | Freq: Every day | ORAL | Status: DC

## 2015-11-11 ENCOUNTER — Telehealth (INDEPENDENT_AMBULATORY_CARE_PROVIDER_SITE_OTHER): Payer: Self-pay | Admitting: *Deleted

## 2015-11-11 ENCOUNTER — Encounter (INDEPENDENT_AMBULATORY_CARE_PROVIDER_SITE_OTHER): Payer: Self-pay | Admitting: *Deleted

## 2015-11-11 DIAGNOSIS — K50113 Crohn's disease of large intestine with fistula: Secondary | ICD-10-CM

## 2015-11-11 NOTE — Telephone Encounter (Signed)
Per Dr.Rehman the patient will need to have labs drawn in 2 weeks. 

## 2015-11-13 ENCOUNTER — Encounter (HOSPITAL_COMMUNITY): Admission: RE | Admit: 2015-11-13 | Payer: Medicare Other | Source: Ambulatory Visit

## 2015-11-14 DIAGNOSIS — K6289 Other specified diseases of anus and rectum: Secondary | ICD-10-CM | POA: Diagnosis not present

## 2015-11-15 ENCOUNTER — Other Ambulatory Visit (INDEPENDENT_AMBULATORY_CARE_PROVIDER_SITE_OTHER): Payer: Self-pay | Admitting: Internal Medicine

## 2015-11-20 ENCOUNTER — Encounter (HOSPITAL_COMMUNITY)
Admission: RE | Admit: 2015-11-20 | Discharge: 2015-11-20 | Disposition: A | Discharge status: Home / Self Care | Payer: Medicare Other | Source: Ambulatory Visit | Attending: Internal Medicine | Admitting: Internal Medicine

## 2015-11-20 DIAGNOSIS — K5090 Crohn's disease, unspecified, without complications: Secondary | ICD-10-CM | POA: Diagnosis not present

## 2015-11-20 MED ORDER — LORATADINE 10 MG PO TABS
ORAL_TABLET | ORAL | Status: AC
  Filled 2015-11-20: qty 1

## 2015-11-20 MED ORDER — ACETAMINOPHEN 325 MG PO TABS
650.0000 mg | ORAL_TABLET | Freq: Once | ORAL | Status: AC
  Administered 2015-11-20: 650 mg via ORAL

## 2015-11-20 MED ORDER — LORATADINE 10 MG PO TABS
10.0000 mg | ORAL_TABLET | Freq: Once | ORAL | Status: AC
  Administered 2015-11-20: 10 mg via ORAL

## 2015-11-20 MED ORDER — SODIUM CHLORIDE 0.9 % IV SOLN
300.0000 mg | Freq: Once | INTRAVENOUS | Status: AC
  Administered 2015-11-20: 300 mg via INTRAVENOUS
  Filled 2015-11-20: qty 30

## 2015-11-20 MED ORDER — ACETAMINOPHEN 325 MG PO TABS
ORAL_TABLET | ORAL | Status: AC
  Filled 2015-11-20: qty 2

## 2015-11-20 MED ORDER — SODIUM CHLORIDE 0.9 % IV SOLN
INTRAVENOUS | Status: DC
  Administered 2015-11-20: 250 mL via INTRAVENOUS

## 2015-11-25 ENCOUNTER — Other Ambulatory Visit (INDEPENDENT_AMBULATORY_CARE_PROVIDER_SITE_OTHER): Payer: Self-pay | Admitting: Internal Medicine

## 2015-11-27 ENCOUNTER — Other Ambulatory Visit (INDEPENDENT_AMBULATORY_CARE_PROVIDER_SITE_OTHER): Payer: Self-pay | Admitting: Internal Medicine

## 2015-12-05 ENCOUNTER — Emergency Department (HOSPITAL_COMMUNITY): Payer: Medicare Other

## 2015-12-05 ENCOUNTER — Inpatient Hospital Stay (HOSPITAL_COMMUNITY)
Admission: EM | Admit: 2015-12-05 | Discharge: 2015-12-09 | DRG: 871 | Disposition: A | Discharge status: Home / Self Care | Payer: Medicare Other | Attending: Internal Medicine | Admitting: Internal Medicine

## 2015-12-05 ENCOUNTER — Encounter (HOSPITAL_COMMUNITY): Payer: Self-pay | Admitting: Emergency Medicine

## 2015-12-05 DIAGNOSIS — R197 Diarrhea, unspecified: Secondary | ICD-10-CM

## 2015-12-05 DIAGNOSIS — D899 Disorder involving the immune mechanism, unspecified: Secondary | ICD-10-CM

## 2015-12-05 DIAGNOSIS — K529 Noninfective gastroenteritis and colitis, unspecified: Secondary | ICD-10-CM | POA: Diagnosis present

## 2015-12-05 DIAGNOSIS — R0602 Shortness of breath: Secondary | ICD-10-CM | POA: Diagnosis not present

## 2015-12-05 DIAGNOSIS — N823 Fistula of vagina to large intestine: Secondary | ICD-10-CM | POA: Diagnosis present

## 2015-12-05 DIAGNOSIS — Z881 Allergy status to other antibiotic agents status: Secondary | ICD-10-CM

## 2015-12-05 DIAGNOSIS — Z8701 Personal history of pneumonia (recurrent): Secondary | ICD-10-CM

## 2015-12-05 DIAGNOSIS — Z886 Allergy status to analgesic agent status: Secondary | ICD-10-CM

## 2015-12-05 DIAGNOSIS — K50913 Crohn's disease, unspecified, with fistula: Secondary | ICD-10-CM | POA: Diagnosis present

## 2015-12-05 DIAGNOSIS — R600 Localized edema: Secondary | ICD-10-CM

## 2015-12-05 DIAGNOSIS — Z9049 Acquired absence of other specified parts of digestive tract: Secondary | ICD-10-CM | POA: Diagnosis not present

## 2015-12-05 DIAGNOSIS — Z800 Family history of malignant neoplasm of digestive organs: Secondary | ICD-10-CM | POA: Diagnosis not present

## 2015-12-05 DIAGNOSIS — Z22322 Carrier or suspected carrier of Methicillin resistant Staphylococcus aureus: Secondary | ICD-10-CM

## 2015-12-05 DIAGNOSIS — N390 Urinary tract infection, site not specified: Secondary | ICD-10-CM | POA: Diagnosis present

## 2015-12-05 DIAGNOSIS — D72829 Elevated white blood cell count, unspecified: Secondary | ICD-10-CM | POA: Diagnosis not present

## 2015-12-05 DIAGNOSIS — K449 Diaphragmatic hernia without obstruction or gangrene: Secondary | ICD-10-CM | POA: Diagnosis present

## 2015-12-05 DIAGNOSIS — J181 Lobar pneumonia, unspecified organism: Secondary | ICD-10-CM | POA: Diagnosis not present

## 2015-12-05 DIAGNOSIS — Z833 Family history of diabetes mellitus: Secondary | ICD-10-CM

## 2015-12-05 DIAGNOSIS — Z8249 Family history of ischemic heart disease and other diseases of the circulatory system: Secondary | ICD-10-CM

## 2015-12-05 DIAGNOSIS — Z87891 Personal history of nicotine dependence: Secondary | ICD-10-CM

## 2015-12-05 DIAGNOSIS — N179 Acute kidney failure, unspecified: Secondary | ICD-10-CM | POA: Diagnosis not present

## 2015-12-05 DIAGNOSIS — J189 Pneumonia, unspecified organism: Secondary | ICD-10-CM

## 2015-12-05 DIAGNOSIS — Z882 Allergy status to sulfonamides status: Secondary | ICD-10-CM | POA: Diagnosis not present

## 2015-12-05 DIAGNOSIS — D638 Anemia in other chronic diseases classified elsewhere: Secondary | ICD-10-CM | POA: Diagnosis not present

## 2015-12-05 DIAGNOSIS — R6883 Chills (without fever): Secondary | ICD-10-CM

## 2015-12-05 DIAGNOSIS — K219 Gastro-esophageal reflux disease without esophagitis: Secondary | ICD-10-CM | POA: Diagnosis present

## 2015-12-05 DIAGNOSIS — K5090 Crohn's disease, unspecified, without complications: Secondary | ICD-10-CM | POA: Diagnosis present

## 2015-12-05 DIAGNOSIS — J690 Pneumonitis due to inhalation of food and vomit: Secondary | ICD-10-CM

## 2015-12-05 DIAGNOSIS — A419 Sepsis, unspecified organism: Principal | ICD-10-CM | POA: Diagnosis present

## 2015-12-05 DIAGNOSIS — A09 Infectious gastroenteritis and colitis, unspecified: Secondary | ICD-10-CM

## 2015-12-05 DIAGNOSIS — Z79899 Other long term (current) drug therapy: Secondary | ICD-10-CM

## 2015-12-05 DIAGNOSIS — D849 Immunodeficiency, unspecified: Secondary | ICD-10-CM | POA: Diagnosis present

## 2015-12-05 DIAGNOSIS — K50113 Crohn's disease of large intestine with fistula: Secondary | ICD-10-CM | POA: Diagnosis present

## 2015-12-05 DIAGNOSIS — Z7952 Long term (current) use of systemic steroids: Secondary | ICD-10-CM

## 2015-12-05 DIAGNOSIS — N183 Chronic kidney disease, stage 3 (moderate): Secondary | ICD-10-CM | POA: Diagnosis present

## 2015-12-05 DIAGNOSIS — R112 Nausea with vomiting, unspecified: Secondary | ICD-10-CM | POA: Diagnosis not present

## 2015-12-05 DIAGNOSIS — D8989 Other specified disorders involving the immune mechanism, not elsewhere classified: Secondary | ICD-10-CM | POA: Diagnosis present

## 2015-12-05 LAB — COMPREHENSIVE METABOLIC PANEL
ALBUMIN: 3.1 g/dL — AB (ref 3.5–5.0)
ALK PHOS: 125 U/L (ref 38–126)
ALT: 16 U/L (ref 14–54)
ANION GAP: 9 (ref 5–15)
AST: 23 U/L (ref 15–41)
BILIRUBIN TOTAL: 0.6 mg/dL (ref 0.3–1.2)
BUN: 14 mg/dL (ref 6–20)
CALCIUM: 8.4 mg/dL — AB (ref 8.9–10.3)
CO2: 14 mmol/L — ABNORMAL LOW (ref 22–32)
Chloride: 120 mmol/L — ABNORMAL HIGH (ref 101–111)
Creatinine, Ser: 1.18 mg/dL — ABNORMAL HIGH (ref 0.44–1.00)
GFR calc Af Amer: 54 mL/min — ABNORMAL LOW (ref >60)
GFR, EST NON AFRICAN AMERICAN: 46 mL/min — AB (ref >60)
GLUCOSE: 106 mg/dL — AB (ref 65–99)
Potassium: 4 mmol/L (ref 3.5–5.1)
Sodium: 143 mmol/L (ref 135–145)
TOTAL PROTEIN: 7.1 g/dL (ref 6.5–8.1)

## 2015-12-05 LAB — I-STAT CHEM 8, ED
BUN: 17 mg/dL (ref 6–20)
CALCIUM ION: 1.2 mmol/L (ref 1.13–1.30)
CHLORIDE: 119 mmol/L — AB (ref 101–111)
CREATININE: 1 mg/dL (ref 0.44–1.00)
GLUCOSE: 100 mg/dL — AB (ref 65–99)
HCT: 34 % — ABNORMAL LOW (ref 36.0–46.0)
Hemoglobin: 11.6 g/dL — ABNORMAL LOW (ref 12.0–15.0)
Potassium: 3.7 mmol/L (ref 3.5–5.1)
Sodium: 145 mmol/L (ref 135–145)
TCO2: 13 mmol/L (ref 0–100)

## 2015-12-05 LAB — LIPASE, BLOOD: Lipase: 30 U/L (ref 11–51)

## 2015-12-05 LAB — URINALYSIS, ROUTINE W REFLEX MICROSCOPIC
BILIRUBIN URINE: NEGATIVE
GLUCOSE, UA: NEGATIVE mg/dL
KETONES UR: NEGATIVE mg/dL
Nitrite: NEGATIVE
PH: 6 (ref 5.0–8.0)
Protein, ur: NEGATIVE mg/dL
SPECIFIC GRAVITY, URINE: 1.015 (ref 1.005–1.030)

## 2015-12-05 LAB — CBC WITH DIFFERENTIAL/PLATELET
BASOS ABS: 0 10*3/uL (ref 0.0–0.1)
BASOS PCT: 0 %
Basophils Absolute: 0 10*3/uL (ref 0.0–0.1)
Basophils Relative: 0 %
EOS ABS: 0 10*3/uL (ref 0.0–0.7)
EOS PCT: 0 %
Eosinophils Absolute: 0.1 10*3/uL (ref 0.0–0.7)
Eosinophils Relative: 0 %
HCT: 31.7 % — ABNORMAL LOW (ref 36.0–46.0)
HEMATOCRIT: 30.5 % — AB (ref 36.0–46.0)
HEMOGLOBIN: 9.5 g/dL — AB (ref 12.0–15.0)
Hemoglobin: 9.1 g/dL — ABNORMAL LOW (ref 12.0–15.0)
LYMPHS ABS: 2.4 10*3/uL (ref 0.7–4.0)
LYMPHS PCT: 10 %
LYMPHS PCT: 14 %
Lymphs Abs: 3.1 10*3/uL (ref 0.7–4.0)
MCH: 24.8 pg — AB (ref 26.0–34.0)
MCH: 25.2 pg — ABNORMAL LOW (ref 26.0–34.0)
MCHC: 30 g/dL (ref 30.0–36.0)
MCHC: 29.8 g/dL — ABNORMAL LOW (ref 30.0–36.0)
MCV: 82.8 fL (ref 78.0–100.0)
MCV: 84.5 fL (ref 78.0–100.0)
MONOS PCT: 8 %
Monocytes Absolute: 1.9 10*3/uL — ABNORMAL HIGH (ref 0.1–1.0)
Monocytes Absolute: 1.6 10*3/uL — ABNORMAL HIGH (ref 0.1–1.0)
Monocytes Relative: 7 %
NEUTROS ABS: 17.8 10*3/uL — AB (ref 1.7–7.7)
NEUTROS PCT: 82 %
NEUTROS PCT: 79 %
Neutro Abs: 20.1 10*3/uL — ABNORMAL HIGH (ref 1.7–7.7)
Platelets: 386 10*3/uL (ref 150–400)
Platelets: 373 10*3/uL (ref 150–400)
RBC: 3.83 MIL/uL — AB (ref 3.87–5.11)
RBC: 3.61 MIL/uL — AB (ref 3.87–5.11)
RDW: 15.6 % — ABNORMAL HIGH (ref 11.5–15.5)
RDW: 15.8 % — ABNORMAL HIGH (ref 11.5–15.5)
WBC: 24.5 10*3/uL — AB (ref 4.0–10.5)
WBC: 22.6 10*3/uL — AB (ref 4.0–10.5)

## 2015-12-05 LAB — MAGNESIUM: Magnesium: 1.6 mg/dL — ABNORMAL LOW (ref 1.7–2.4)

## 2015-12-05 LAB — APTT: aPTT: 22 seconds — ABNORMAL LOW (ref 24–37)

## 2015-12-05 LAB — PROTIME-INR
INR: 1.28 (ref 0.00–1.49)
PROTHROMBIN TIME: 16.2 s — AB (ref 11.6–15.2)

## 2015-12-05 LAB — STREP PNEUMONIAE URINARY ANTIGEN: STREP PNEUMO URINARY ANTIGEN: NEGATIVE

## 2015-12-05 LAB — TSH: TSH: 1.404 u[IU]/mL (ref 0.350–4.500)

## 2015-12-05 LAB — C DIFFICILE QUICK SCREEN W PCR REFLEX
C DIFFICILE (CDIFF) TOXIN: NEGATIVE
C DIFFICLE (CDIFF) ANTIGEN: NEGATIVE
C Diff interpretation: NEGATIVE

## 2015-12-05 LAB — PHOSPHORUS: Phosphorus: 2.7 mg/dL (ref 2.5–4.6)

## 2015-12-05 LAB — URINE MICROSCOPIC-ADD ON

## 2015-12-05 LAB — PROCALCITONIN: PROCALCITONIN: 0.27 ng/mL

## 2015-12-05 LAB — LACTIC ACID, PLASMA
LACTIC ACID, VENOUS: 1.9 mmol/L (ref 0.5–2.0)
LACTIC ACID, VENOUS: 2.1 mmol/L — AB (ref 0.5–2.0)

## 2015-12-05 MED ORDER — DEXTROSE 5 % IV SOLN
1.0000 g | Freq: Once | INTRAVENOUS | Status: AC
  Administered 2015-12-05: 1 g via INTRAVENOUS
  Filled 2015-12-05: qty 10

## 2015-12-05 MED ORDER — MAGNESIUM OXIDE 400 (241.3 MG) MG PO TABS
400.0000 mg | ORAL_TABLET | Freq: Every day | ORAL | Status: DC
  Administered 2015-12-05 – 2015-12-09 (×5): 400 mg via ORAL
  Filled 2015-12-05 (×5): qty 1

## 2015-12-05 MED ORDER — SODIUM CHLORIDE 0.9 % IV BOLUS (SEPSIS)
500.0000 mL | Freq: Once | INTRAVENOUS | Status: AC
  Administered 2015-12-05: 500 mL via INTRAVENOUS

## 2015-12-05 MED ORDER — POTASSIUM CHLORIDE 20 MEQ/15ML (10%) PO SOLN
20.0000 meq | Freq: Two times a day (BID) | ORAL | Status: DC
  Administered 2015-12-05 – 2015-12-08 (×8): 20 meq via ORAL
  Filled 2015-12-05 (×10): qty 15

## 2015-12-05 MED ORDER — ACETAMINOPHEN 500 MG PO TABS
1000.0000 mg | ORAL_TABLET | Freq: Four times a day (QID) | ORAL | Status: DC | PRN
  Administered 2015-12-05 – 2015-12-09 (×5): 1000 mg via ORAL
  Filled 2015-12-05 (×5): qty 2

## 2015-12-05 MED ORDER — ONDANSETRON HCL 4 MG/2ML IJ SOLN
4.0000 mg | Freq: Once | INTRAMUSCULAR | Status: AC
  Administered 2015-12-05: 4 mg via INTRAVENOUS
  Filled 2015-12-05: qty 2

## 2015-12-05 MED ORDER — SODIUM CHLORIDE 0.9 % IV SOLN
INTRAVENOUS | Status: DC
  Administered 2015-12-05: 13:00:00 via INTRAVENOUS

## 2015-12-05 MED ORDER — OXYCODONE-ACETAMINOPHEN 5-325 MG PO TABS: 1.0000 | ORAL_TABLET | Freq: Four times a day (QID) | ORAL | Status: DC | PRN

## 2015-12-05 MED ORDER — SODIUM CHLORIDE 0.9 % IJ SOLN
3.0000 mL | Freq: Two times a day (BID) | INTRAMUSCULAR | Status: DC
  Administered 2015-12-06 – 2015-12-08 (×3): 3 mL via INTRAVENOUS

## 2015-12-05 MED ORDER — ONDANSETRON HCL 4 MG PO TABS: 4.0000 mg | ORAL_TABLET | Freq: Four times a day (QID) | ORAL | Status: DC | PRN

## 2015-12-05 MED ORDER — SODIUM CHLORIDE 0.9 % IV SOLN
INTRAVENOUS | Status: DC
  Administered 2015-12-05 – 2015-12-08 (×6): via INTRAVENOUS

## 2015-12-05 MED ORDER — PIPERACILLIN-TAZOBACTAM 3.375 G IVPB 30 MIN
3.3750 g | INTRAVENOUS | Status: AC
  Administered 2015-12-05: 3.375 g via INTRAVENOUS
  Filled 2015-12-05: qty 50

## 2015-12-05 MED ORDER — MORPHINE SULFATE (PF) 4 MG/ML IV SOLN
4.0000 mg | Freq: Once | INTRAVENOUS | Status: AC
  Administered 2015-12-05: 4 mg via INTRAVENOUS
  Filled 2015-12-05: qty 1

## 2015-12-05 MED ORDER — PIPERACILLIN-TAZOBACTAM 3.375 G IVPB
3.3750 g | Freq: Three times a day (TID) | INTRAVENOUS | Status: DC
  Administered 2015-12-05 – 2015-12-09 (×11): 3.375 g via INTRAVENOUS
  Filled 2015-12-05 (×13): qty 50

## 2015-12-05 MED ORDER — VANCOMYCIN HCL IN DEXTROSE 1-5 GM/200ML-% IV SOLN
1000.0000 mg | Freq: Once | INTRAVENOUS | Status: AC
Start: 2015-12-05 — End: 2015-12-05
  Administered 2015-12-05: 1000 mg via INTRAVENOUS
  Filled 2015-12-05: qty 200

## 2015-12-05 MED ORDER — DICYCLOMINE HCL 10 MG/5ML PO SOLN
10.0000 mg | Freq: Three times a day (TID) | ORAL | Status: DC
  Administered 2015-12-05 – 2015-12-09 (×12): 10 mg via ORAL
  Filled 2015-12-05 (×13): qty 5

## 2015-12-05 MED ORDER — SPIRONOLACTONE 50 MG PO TABS
50.0000 mg | ORAL_TABLET | Freq: Every day | ORAL | Status: DC
  Administered 2015-12-05 – 2015-12-09 (×5): 50 mg via ORAL
  Filled 2015-12-05 (×5): qty 1

## 2015-12-05 MED ORDER — MORPHINE SULFATE (PF) 2 MG/ML IV SOLN
1.0000 mg | INTRAVENOUS | Status: DC | PRN
  Administered 2015-12-05 – 2015-12-08 (×9): 1 mg via INTRAVENOUS
  Filled 2015-12-05 (×9): qty 1

## 2015-12-05 MED ORDER — DEXTROSE 5 % IV SOLN
2.0000 g | Freq: Once | INTRAVENOUS | Status: DC
  Filled 2015-12-05: qty 2

## 2015-12-05 MED ORDER — MORPHINE SULFATE (PF) 2 MG/ML IV SOLN: 1.0000 mg | INTRAVENOUS | Status: DC | PRN

## 2015-12-05 MED ORDER — ONDANSETRON HCL 4 MG/2ML IJ SOLN
4.0000 mg | Freq: Four times a day (QID) | INTRAMUSCULAR | Status: DC | PRN
  Administered 2015-12-05 – 2015-12-08 (×4): 4 mg via INTRAVENOUS
  Filled 2015-12-05 (×4): qty 2

## 2015-12-05 MED ORDER — ADULT MULTIVITAMIN W/MINERALS CH
1.0000 | ORAL_TABLET | Freq: Every day | ORAL | Status: DC
  Administered 2015-12-05 – 2015-12-09 (×5): 1 via ORAL
  Filled 2015-12-05 (×5): qty 1

## 2015-12-05 MED ORDER — VANCOMYCIN HCL 500 MG IV SOLR
500.0000 mg | Freq: Two times a day (BID) | INTRAVENOUS | Status: DC
  Administered 2015-12-06: 500 mg via INTRAVENOUS
  Filled 2015-12-05 (×2): qty 500

## 2015-12-05 MED ORDER — LEVOFLOXACIN IN D5W 500 MG/100ML IV SOLN
500.0000 mg | Freq: Once | INTRAVENOUS | Status: AC
  Administered 2015-12-05: 500 mg via INTRAVENOUS
  Filled 2015-12-05: qty 100

## 2015-12-05 NOTE — ED Notes (Signed)
IV attempted x2 will get Ultrasound

## 2015-12-05 NOTE — ED Notes (Signed)
Pt. Unable to urinate at this time. Will collect urine when pt. Voids. Nurse aware.  

## 2015-12-05 NOTE — ED Provider Notes (Addendum)
CSN: [id number]     Arrival date & time 12/05/15  0612 History   First MD Initiated Contact with Patient 12/05/15 0701     Chief Complaint  Patient presents with  . Abdominal Pain  . Emesis  . Diarrhea     (Consider location/radiation/quality/duration/timing/severity/associated sxs/prior Treatment) Patient is a 69 y.o. female presenting with abdominal pain, vomiting, and diarrhea. The history is provided by the patient.  Abdominal Pain Associated symptoms: chills, diarrhea, shortness of breath and vomiting   Associated symptoms: no chest pain, no dysuria, no fever and no sore throat   Emesis Associated symptoms: abdominal pain, chills and diarrhea   Associated symptoms: no headaches and no sore throat   Diarrhea Associated symptoms: abdominal pain, chills and vomiting   Associated symptoms: no fever and no headaches   Patient w hx IBS, crohns disease, c/o abd pain and nvd in the past day.  Pt notes a few episodes emesis since yesterday, states emesis clear, not bloody or bilious. Diarrhea is watery, a few episodes since last pm  - notes has diarrhea at baseline, but increased frequency in past day. abd pain, is crampy, moderate, diffuse. No specific exacerbating or alleviating factors. No fevers. No dysuria.  Compliant w normal medications. Pt on cipro now for 'bladder infection'.        Past Medical History  Diagnosis Date  . Crohn's disease ([place])   . Chronic diarrhea   . GERD (gastroesophageal reflux disease)   . Polyarthralgia   . Hiatal hernia   . Hemorrhoids   . Anemia   . Hiatal hernia     hiatal  . Pneumonia 2014  . Psoriasis    Past Surgical History  Procedure Laterality Date  . Bowel resection  2009  . 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 Anal stenosis  ~ 2010  . Colonoscopy  2013  . Cholecystectomy  1990's  . Appendectomy  1980's  . Goiter  1999  . Esophagogastroduodenoscopy  11/11/2011    Procedure: ESOPHAGOGASTRODUODENOSCOPY (EGD);  Surgeon: Missy Sabins, MD;   Location: [facility] ENDOSCOPY;  Service: Endoscopy;  Laterality: N/A;  . Flexible sigmoidoscopy  11/11/2011    Procedure: FLEXIBLE SIGMOIDOSCOPY;  Surgeon: Missy Sabins, MD;  Location: [place];  Service: Endoscopy;  Laterality: N/A;  . Esophagogastroduodenoscopy  05/18/2012    Procedure: ESOPHAGOGASTRODUODENOSCOPY (EGD);  Surgeon: Rogene Houston, MD;  Location: [facility] ENDO SUITE;  Service: Endoscopy;  Laterality: N/A;  200  . Colostomy takedown  2009    "only had it for about 1 month" (08/22/2013)  . Left heart catheterization with coronary angiogram N/A 07/09/2014    Procedure: LEFT HEART CATHETERIZATION WITH CORONARY ANGIOGRAM;  Surgeon: Peter M Martinique, MD;  Location: [facility] CATH LAB;  Service: Cardiovascular;  Laterality: N/A;  . Proctoscopy N/A 01/31/2015    Procedure: RIGID PROCTOSCOPY;  Surgeon: Leighton Ruff, MD;  Location: [place];  Service: General;  Laterality: N/A;  . Rectal exam under anesthesia N/A 01/31/2015    Procedure: [place] OF A SETON;  Surgeon: Leighton Ruff, MD;  Location: [facility] OR;  Service: General;  Laterality: N/A;   Family History  Problem Relation Age of Onset  . Diabetes Mother   . Healthy Sister   . Hypertension Brother   . Colon cancer Brother     died with colon cancer  . Heart disease Father   . Crohn's disease Paternal Aunt    Social History  Substance Use Topics  .  Smoking status: Former Smoker -- 0.12 packs/day for 4 years    Types: Cigarettes    Quit date: 10/05/1996  . Smokeless tobacco: Never Used     Comment: 08/22/2013 1 pack every two weeks when she did smoked  . Alcohol Use: No   OB History    No data available     Review of Systems  Constitutional: Positive for chills. Negative for fever.  HENT: Negative for sore throat.   Eyes: Negative for redness.  Respiratory: Positive for shortness of breath.        Pt indicates sob for past couple days, occasional non prod cough, chills.   Cardiovascular: Negative for chest  pain and leg swelling.  Gastrointestinal: Positive for vomiting, abdominal pain and diarrhea.  Endocrine: Negative for polyuria.  Genitourinary: Negative for dysuria and flank pain.       Recently started tx for uti  Musculoskeletal: Negative for back pain and neck pain.  Skin: Negative for rash.  Neurological: Negative for headaches.  Hematological: Does not bruise/bleed easily.  Psychiatric/Behavioral: Negative for confusion.      Allergies  Zithromax; Aspirin; Ibuprofen; and Sulfa antibiotics  Home Medications   Prior to Admission medications   Medication Sig Start Date End Date Taking? Authorizing Provider  acetaminophen (TYLENOL) 500 MG tablet Take 1,000 mg by mouth every 6 (six) hours as needed for mild pain or moderate pain.   Yes Historical Provider, MD  albuterol (PROVENTIL HFA;VENTOLIN HFA) 108 (90 BASE) MCG/ACT inhaler Inhale 2 puffs into the lungs every 6 (six) hours as needed for wheezing. 02/27/13  Yes Nishant Dhungel, MD  Calcium Carbonate-Vitamin D (CALCIUM + D PO) Take 1 tablet by mouth daily.   Yes Historical Provider, MD  ciprofloxacin (CIPRO) 500 MG tablet TAKE 1 TABLET (500 MG TOTAL) BY MOUTH 2 (TWO) TIMES DAILY. 11/25/15  Yes Rogene Houston, MD  Cyanocobalamin (VITAMIN B-12 IJ) Inject 1 each as directed every 30 (thirty) days. Around the 1st week of each month   Yes Historical Provider, MD  dicyclomine (BENTYL) 10 MG/5ML syrup TAKE 2 TEASPOONFUL BY MOUTH 3 TIMES A DAY BEFORE MEALS 10/10/15  Yes Butch Penny, NP  diphenhydrAMINE (BENADRYL) 25 mg capsule Take 25 mg by mouth every 6 (six) hours as needed for itching. 03/22/14  Yes Adeline Saralyn Pilar, MD  diphenoxylate-atropine (LOMOTIL) 2.5-0.025 MG tablet Take 2 tablets by mouth 2 (two) times daily. 09/16/15  Yes Rogene Houston, MD  HYDROcodone-acetaminophen (NORCO) 7.5-325 MG per tablet Take 1 tablet by mouth every 4 (four) hours as needed for moderate pain.  07/20/15  Yes Historical Provider, MD  inFLIXimab  (REMICADE) 100 MG injection Inject 100 mg into the vein every 6 (six) weeks.   Yes Historical Provider, MD  magnesium oxide (MAG-OX) 400 (241.3 MG) MG tablet Take 1 tablet (400 mg total) by mouth daily. 11/09/15  Yes Rogene Houston, MD  Multiple Vitamin (MULTIVITAMIN WITH MINERALS) TABS Take 1 tablet by mouth daily.   Yes Historical Provider, MD  ondansetron (ZOFRAN) 4 MG tablet TAKE 1 TABLET BY MOUTH TWICE A DAY AS NEEDED FOR NAUSEA AND VOMITING 11/19/15  Yes Butch Penny, NP  pantoprazole (PROTONIX) 40 MG tablet TAKE 1 TABLET (40 MG TOTAL) BY MOUTH 2 (TWO) TIMES DAILY BEFORE A MEAL. 11/27/15  Yes Butch Penny, NP  potassium chloride 20 MEQ/15ML (10%) SOLN Take 15 mLs (20 mEq total) by mouth 2 (two) times daily. 07/16/15  Yes Rogene Houston, MD  spironolactone (ALDACTONE) 50  MG tablet Take 1 tablet (50 mg total) by mouth 2 (two) times daily. Patient taking differently: Take 50 mg by mouth daily.  02/18/15  Yes Rogene Houston, MD  predniSONE (DELTASONE) 20 MG tablet Take 0.5 tablets (10 mg total) by mouth daily with breakfast. Use as directed. Patient not taking: Reported on 12/05/2015 09/12/15   Rogene Houston, MD  Wheat Dextrin (BENEFIBER DRINK MIX) PACK Take 4 g by mouth at bedtime. Patient not taking: Reported on 12/05/2015 09/16/15   Rogene Houston, MD   BP 114/63 mmHg  Pulse 117  Temp(Src) 99.7 F (37.6 C) (Oral)  Resp 16  SpO2 100% Physical Exam  Constitutional: She appears well-developed and well-nourished. No distress.  HENT:  Mouth/Throat: Oropharynx is clear and moist.  Eyes: Conjunctivae are normal. No scleral icterus.  Neck: Neck supple. No tracheal deviation present.  Cardiovascular: Normal rate, regular rhythm, normal heart sounds and intact distal pulses.  Exam reveals no gallop and no friction rub.   No murmur heard. Pulmonary/Chest: Effort normal. No respiratory distress.  Rhonchi r lower  Abdominal: Soft. Normal appearance and bowel sounds are normal. She exhibits  no distension and no mass. There is tenderness. There is no rebound and no guarding.  Mid to lower abd tendereness  Genitourinary:  No cva tenderness  Musculoskeletal: She exhibits no edema.  Neurological: She is alert.  Skin: Skin is warm and dry. No rash noted. She is not diaphoretic.  Psychiatric: She has a normal mood and affect.  Nursing note and vitals reviewed.   ED Course  Procedures (including critical care time) Labs Review   Results for orders placed or performed during the hospital encounter of 12/05/15  CBC with Differential/Platelet  Result Value Ref Range   WBC 24.5 (H) 4.0 - 10.5 K/uL   RBC 3.83 (L) 3.87 - 5.11 MIL/uL   Hemoglobin 9.5 (L) 12.0 - 15.0 g/dL   HCT 31.7 (L) 36.0 - 46.0 %   MCV 82.8 78.0 - 100.0 fL   MCH 24.8 (L) 26.0 - 34.0 pg   MCHC 30.0 30.0 - 36.0 g/dL   RDW 15.6 (H) 11.5 - 15.5 %   Platelets 386 150 - 400 K/uL   Neutrophils Relative % 82 %   Neutro Abs 20.1 (H) 1.7 - 7.7 K/uL   Lymphocytes Relative 10 %   Lymphs Abs 2.4 0.7 - 4.0 K/uL   Monocytes Relative 8 %   Monocytes Absolute 1.9 (H) 0.1 - 1.0 K/uL   Eosinophils Relative 0 %   Eosinophils Absolute 0.0 0.0 - 0.7 K/uL   Basophils Relative 0 %   Basophils Absolute 0.0 0.0 - 0.1 K/uL  Urinalysis, Routine w reflex microscopic (not at [facility])  Result Value Ref Range   Color, Urine YELLOW YELLOW   APPearance CLOUDY (A) CLEAR   Specific Gravity, Urine 1.015 1.005 - 1.030   pH 6.0 5.0 - 8.0   Glucose, UA NEGATIVE NEGATIVE mg/dL   Hgb urine dipstick SMALL (A) NEGATIVE   Bilirubin Urine NEGATIVE NEGATIVE   Ketones, ur NEGATIVE NEGATIVE mg/dL   Protein, ur NEGATIVE NEGATIVE mg/dL   Nitrite NEGATIVE NEGATIVE   Leukocytes, UA LARGE (A) NEGATIVE  Lipase, blood  Result Value Ref Range   Lipase 30 11 - 51 U/L  Urine microscopic-add on  Result Value Ref Range   Squamous Epithelial / LPF 0-5 (A) NONE SEEN   WBC, UA 6-30 0 - 5 WBC/hpf   RBC / HPF 0-5 0 - 5 RBC/hpf  Bacteria, UA FEW (A) NONE  SEEN  I-Stat Chem 8, ED  Result Value Ref Range   Sodium 145 135 - 145 mmol/L   Potassium 3.7 3.5 - 5.1 mmol/L   Chloride 119 (H) 101 - 111 mmol/L   BUN 17 6 - 20 mg/dL   Creatinine, Ser 1.00 0.44 - 1.00 mg/dL   Glucose, Bld 100 (H) 65 - 99 mg/dL   Calcium, Ion 1.20 1.13 - 1.30 mmol/L   TCO2 13 0 - 100 mmol/L   Hemoglobin 11.6 (L) 12.0 - 15.0 g/dL   HCT 34.0 (L) 36.0 - 46.0 %   Dg Chest 2 View  12/05/2015  CLINICAL DATA:  69 year old female with history of abdominal pain common nausea, vomiting and diarrhea since yesterday evening. Shortness of breath with chills. EXAM: CHEST  2 VIEW COMPARISON:  Chest x-ray a 07/29/2015. FINDINGS: Extensive airspace consolidation throughout the right mid to lower lung new compared to the prior examination. Left lung is clear. No pleural effusions. No evidence of pulmonary edema. Heart size is mildly enlarged. Massive hiatal hernia, which appears slightly larger than the prior study and appears to contain both stomach and colon. Surgical clips near the thoracic inlet. IMPRESSION: 1. Extensive airspace consolidation throughout the right mid to lower lung, predominantly in the right lower lobe, concerning for aspiration pneumonia. 2. Enlarging massive hiatal hernia which contains both stomach and colon. 3. Mild cardiomegaly. Electronically Signed   By: Vinnie Langton M.D.   On: 12/05/2015 11:44      I have personally reviewed and evaluated these images and lab results as part of my medical decision-making.    MDM   Iv ns bolus. zofran iv. Morphine iv.  Reviewed nursing notes and prior charts for additional history.   Pt w improvement in abd pain/symptoms, repeat abd exam, no tenderness.  Wbc is elevated. abd remains non painful, non tender.  ua returns, LE pos, 6-30 wbc, bacteria - will culture and rx.  Rocephin ordered.   cxr subsequently returns concerning for pna. Will add additional abx coverage (note pt on po abx and received iv abx in ED for  uti, therefore, do not feel utility in adding blood cx)  Med service contacted for admission.        Lajean Saver, MD 12/05/15 [phone]

## 2015-12-05 NOTE — H&P (Signed)
[facility] History and Physical  Danielle [name] [id number]:[id number] DOB: 10/02/1946 DOA: 12/05/2015  Referring physician: ER physician: Dr. Lajean Saver  PCP: Maggie Font, MD  Chief Complaint: Nausea, vomiting, diarrhea  HPI:  69 year old female with past medical history significant for Crohn's disease, follows with GI (Dr. 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 Leighton Ruff. She does have history of chronic diarrhea which is relatively controlled with Lomotil and Bentyl. She was recently diagnosed with urinary tract infection and was started on antibiotic which she still is taking. Over past 24 hours she started to experience intractable nausea, vomiting and diarrhea. She felt so sick and was unable to take her medications because of the symptoms. No fevers or chills. No chest pain or shortness of breath. And no significant abdominal pain. No cough. No blood in the stool or urine.  In ED, blood pressure was stable, 114/63, no fevers. Her heart rate was 117. Blood work showed white blood cell count of 24.5, hemoglobin 11.6 otherwise unremarkable. Chest x-ray demonstrated extensive airspace consolidation throughout the right mid to lower lung and right lower lobe concerning for aspiration pneumonia. Because of diarrhea stool was sent for C. difficile. Patient was started on broad-spectrum antibiotics as sepsis criteria met on the admission.  Assessment & Plan    Principal problem: Sepsis secondary to possible aspiration pneumonia / lobar pneumonia, unspecified organism / leukocytosis - Sepsis criteria met on the admission with tachycardia, leukocytosis and source of infection most likely pneumonia versus UTI versus C.diff - Chest x-ray on the admission demonstrated extensive airspace consolidation throughout the right mid to lower lung and predominantly in the right lower lobe concerning for aspiration  pneumonia - Sepsis order set placed. Pneumonia order set placed. - Started on cefepime and vancomycin per sepsis protocol - Because of the diarrhea patient was also tested for C. Difficile - Follow-up blood culture results, urine culture results - Follow-up pro calcitonin level, lactic acid level. - Follow up strep pneumonia, Legionella and respiratory culture results - Please note that leukocytosis may be reflective off steroids.    Active Problems: Urinary tract infection - Source of infection may also be urinary tract infection. She was recently started on antibiotics for UTI. - Urinalysis showed large leukocytes and few bacteria - Zosyn will cover for UTI - Follow up urine culture results  Diarrhea presumed infectious etiology - Stool tested for C. Difficile - Follow up GI pathogen panel - We will start Flagyl once stool for C. difficile results are back  History of Crohn's disease - Patient gets infliximab infusion every 6 weeks by GI - Patient is on daily prednisone 20 mg   Anemia of chronic disease - Hemoglobin stable at 11.6  DVT prophylaxis:  - SCDs bilaterally  Radiological Exams on Admission: Dg Chest 2 View 12/05/2015  1. Extensive airspace consolidation throughout the right mid to lower lung, predominantly in the right lower lobe, concerning for aspiration pneumonia. 2. Enlarging massive hiatal hernia which contains both stomach and colon. 3. Mild cardiomegaly. Electronically Signed   By: Vinnie Langton M.D.   On: 12/05/2015 11:44    Code Status: Full Family Communication: Plan of care discussed with the patient  Disposition Plan: Admit for further evaluation, telemetry   Leisa Lenz, MD  [facility] Pager [phone]  Time spent in minutes: 75 minutes  Review of Systems:  Constitutional: Negative for fever, chills and malaise/fatigue. Negative for diaphoresis.  HENT: Negative  for hearing loss, ear pain, nosebleeds, congestion, sore throat, neck  pain, tinnitus and ear discharge.   Eyes: Negative for blurred vision, double vision, photophobia, pain, discharge and redness.  Respiratory: Negative for cough, hemoptysis, sputum production, shortness of breath, wheezing and stridor.   Cardiovascular: Negative for chest pain, palpitations, orthopnea, claudication and leg swelling.  Gastrointestinal: per HPI Genitourinary: Negative for dysuria, urgency, frequency, hematuria and flank pain.  Musculoskeletal: Negative for myalgias, back pain, joint pain and falls.  Skin: Negative for itching and rash.  Neurological: Negative for dizziness and weakness. Negative for tingling, tremors, sensory change, speech change, focal weakness, loss of consciousness and headaches.  Endo/Heme/Allergies: Negative for environmental allergies and polydipsia. Does not bruise/bleed easily.  Psychiatric/Behavioral: Negative for suicidal ideas. The patient is not nervous/anxious.      Past Medical History  Diagnosis Date  . Crohn's disease ([place])   . Chronic diarrhea   . GERD (gastroesophageal reflux disease)   . Polyarthralgia   . Hiatal hernia   . Hemorrhoids   . Anemia   . Hiatal hernia     hiatal  . Pneumonia 2014  . Psoriasis    Past Surgical History  Procedure Laterality Date  . Bowel resection  2009  . 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 Anal stenosis  ~ 2010  . Colonoscopy  2013  . Cholecystectomy  1990's  . Appendectomy  1980's  . Goiter  1999  . Esophagogastroduodenoscopy  11/11/2011    Procedure: ESOPHAGOGASTRODUODENOSCOPY (EGD);  Surgeon: Missy Sabins, MD;  Location: [facility] ENDOSCOPY;  Service: Endoscopy;  Laterality: N/A;  . Flexible sigmoidoscopy  11/11/2011    Procedure: FLEXIBLE SIGMOIDOSCOPY;  Surgeon: Missy Sabins, MD;  Location: [place];  Service: Endoscopy;  Laterality: N/A;  . Esophagogastroduodenoscopy  05/18/2012    Procedure: ESOPHAGOGASTRODUODENOSCOPY (EGD);  Surgeon: Rogene Houston, MD;  Location: [facility]O [facility];  Service:  Endoscopy;  Laterality: N/A;  200  . Colostomy takedown  2009    "only had it for about 1 month" (08/22/2013)  . Left heart catheterization with coronary angiogram N/A 07/09/2014    Procedure: LEFT HEART CATHETERIZATION WITH CORONARY ANGIOGRAM;  Surgeon: Peter M Martinique, MD;  Location[facility] CATH LAB;  Service: Cardiovascular;  Laterality: N/A;  . Proctoscopy N/A 01/31/2015    Procedure: RIGID PROCTOSCOPY;  Surgeon: Leighton Ruff, MD;  Location: [place];  Service: General;  Laterality: N/A;  . Rectal exam under anesthesia N/A 01/31/2015    Procedure: [place] OF A SETON;  Surgeon: Leighton Ruff, MD;  Location: [place];  Service: General;  Laterality: N/A;   Social History:  reports that she quit smoking about 19 years ago. Her smoking use included Cigarettes. She has a .48 pack-year smoking history. She has never used smokeless tobacco. She reports that she does not drink alcohol or use illicit drugs.  Allergies  Allergen Reactions  . Zithromax [Azithromycin] Swelling    Patient states that she has a swollen mouth/ blisters, yeast present mouth and vaginia.  . Aspirin Other (See Comments)    Cramps her stomach  . Ibuprofen Swelling  . Sulfa Antibiotics Nausea And Vomiting    Family History:  Family History  Problem Relation Age of Onset  . Diabetes Mother   . Healthy Sister   . Hypertension Brother   . Colon cancer Brother     died with colon cancer  . Heart disease Father   . Crohn's disease Paternal Aunt  Prior to Admission medications   Medication Sig Start Date End Date Taking? Authorizing Provider  acetaminophen (TYLENOL) 500 MG tablet Take 1,000 mg by mouth every 6 (six) hours as needed for mild pain or moderate pain.   Yes Historical Provider, MD  albuterol (PROVENTIL HFA;VENTOLIN HFA) 108 (90 BASE) MCG/ACT inhaler Inhale 2 puffs into the lungs every 6 (six) hours as needed for wheezing. 02/27/13  Yes Nishant Dhungel, MD  Calcium  Carbonate-Vitamin D (CALCIUM + D PO) Take 1 tablet by mouth daily.   Yes Historical Provider, MD  ciprofloxacin (CIPRO) 500 MG tablet TAKE 1 TABLET (500 MG TOTAL) BY MOUTH 2 (TWO) TIMES DAILY. 11/25/15  Yes Rogene Houston, MD  Cyanocobalamin (VITAMIN B-12 IJ) Inject 1 each as directed every 30 (thirty) days. Around the 1st week of each month   Yes Historical Provider, MD  dicyclomine (BENTYL) 10 MG/5ML syrup TAKE 2 TEASPOONFUL BY MOUTH 3 TIMES A DAY BEFORE MEALS 10/10/15  Yes Butch Penny, NP  diphenhydrAMINE (BENADRYL) 25 mg capsule Take 25 mg by mouth every 6 (six) hours as needed for itching. 03/22/14  Yes Adeline Saralyn Pilar, MD  diphenoxylate-atropine (LOMOTIL) 2.5-0.025 MG tablet Take 2 tablets by mouth 2 (two) times daily. 09/16/15  Yes Rogene Houston, MD  HYDROcodone-acetaminophen (NORCO) 7.5-325 MG per tablet Take 1 tablet by mouth every 4 (four) hours as needed for moderate pain.  07/20/15  Yes Historical Provider, MD  inFLIXimab (REMICADE) 100 MG injection Inject 100 mg into the vein every 6 (six) weeks.   Yes Historical Provider, MD  magnesium oxide (MAG-OX) 400 (241.3 MG) MG tablet Take 1 tablet (400 mg total) by mouth daily. 11/09/15  Yes Rogene Houston, MD  Multiple Vitamin (MULTIVITAMIN WITH MINERALS) TABS Take 1 tablet by mouth daily.   Yes Historical Provider, MD  ondansetron (ZOFRAN) 4 MG tablet TAKE 1 TABLET BY MOUTH TWICE A DAY AS NEEDED FOR NAUSEA AND VOMITING 11/19/15  Yes Butch Penny, NP  pantoprazole (PROTONIX) 40 MG tablet TAKE 1 TABLET (40 MG TOTAL) BY MOUTH 2 (TWO) TIMES DAILY BEFORE A MEAL. 11/27/15  Yes Butch Penny, NP  potassium chloride 20 MEQ/15ML (10%) SOLN Take 15 mLs (20 mEq total) by mouth 2 (two) times daily. 07/16/15  Yes Rogene Houston, MD  spironolactone (ALDACTONE) 50 MG tablet Take 1 tablet (50 mg total) by mouth 2 (two) times daily. Patient taking differently: Take 50 mg by mouth daily.  02/18/15  Yes Rogene Houston, MD  predniSONE (DELTASONE) 20 MG  tablet Take 0.5 tablets (10 mg total) by mouth daily with breakfast. Use as directed. Patient not taking: Reported on 12/05/2015 09/12/15   Rogene Houston, MD  Wheat Dextrin (BENEFIBER DRINK MIX) PACK Take 4 g by mouth at bedtime. Patient not taking: Reported on 12/05/2015 09/16/15   Rogene Houston, MD   Physical Exam: Filed Vitals:   12/05/15 0636 12/05/15 0810 12/05/15 0853 12/05/15 1100  BP: 114/63  103/59 112/68  Pulse: 117  111 104  Temp: 99.7 F (37.6 C) 98.4 F (36.9 C)    TempSrc: Oral     Resp: 16  16 16   SpO2: 100%  94% 100%    Physical Exam  Constitutional: Appears well-developed and well-nourished. No distress.  HENT: Normocephalic. No tonsillar erythema or exudates Eyes: Conjunctivae are normal. N scleral icterus.  Neck: Normal ROM. Neck supple. No JVD. No tracheal deviation. No thyromegaly.  CVS: tachycardic, S1/S2 +, no murmurs, no gallops, no carotid  bruit.  Pulmonary: Effort and breath sounds normal, no stridor, rhonchi, wheezes, rales.  Abdominal: Soft. BS +,  no distension, tenderness, rebound or guarding.  Musculoskeletal: Normal range of motion. No edema and no tenderness.  Lymphadenopathy: No lymphadenopathy noted, cervical, inguinal. Neuro: Alert. Normal reflexes, muscle tone coordination. No focal neurologic deficits. Skin: Skin is warm and dry. No rash noted.  No erythema. No pallor.  Psychiatric: Normal mood and affect. Behavior, judgment, thought content normal.   Labs on Admission:  Basic Metabolic Panel:  Recent Labs Lab 12/05/15 0721  NA 145  K 3.7  CL 119*  GLUCOSE 100*  BUN 17  CREATININE 1.00   Liver Function Tests: No results for input(s): AST, ALT, ALKPHOS, BILITOT, PROT, ALBUMIN in the last 168 hours.  Recent Labs Lab 12/05/15 0711  LIPASE 30   No results for input(s): AMMONIA in the last 168 hours. CBC:  Recent Labs Lab 12/05/15 0711 12/05/15 0721  WBC 24.5*  --   NEUTROABS 20.1*  --   HGB 9.5* 11.6*  HCT 31.7* 34.0*   MCV 82.8  --   PLT 386  --    Cardiac Enzymes: No results for input(s): CKTOTAL, CKMB, CKMBINDEX, TROPONINI in the last 168 hours. BNP: Invalid input(s): POCBNP CBG: No results for input(s): GLUCAP in the last 168 hours.  If 7PM-7AM, please contact night-coverage [email or web address] Password [email or web address] 12/05/2015, 12:50 PM

## 2015-12-05 NOTE — ED Notes (Signed)
PT CAN GO TO FLOOR AT 14:55.

## 2015-12-05 NOTE — ED Notes (Signed)
Stuck patient x1 for blood, unsuccessful x1

## 2015-12-05 NOTE — ED Notes (Signed)
Phlebotomy called to draw labs.

## 2015-12-05 NOTE — ED Notes (Signed)
Pt is c/o abd pain with nausea, vomiting, and diarrhea that started yesterday evening and has lasted throughout the night  Pt states she feels short of breath and has chills  Pt states she has crohn's disease  Pt denies any blood in her stools this far

## 2015-12-05 NOTE — ED Notes (Signed)
Attempted to call report

## 2015-12-05 NOTE — Progress Notes (Signed)
ANTIBIOTIC CONSULT NOTE - INITIAL  Pharmacy Consult for Vancomycin, Zosyn Indication: Sepsis secondary to possible aspiration pneumonia vs. UTI  Allergies  Allergen Reactions  . Zithromax [Azithromycin] Swelling    Patient states that she has a swollen mouth/ blisters, yeast present mouth and vaginia.  . Aspirin Other (See Comments)    Cramps her stomach  . Ibuprofen Swelling  . Sulfa Antibiotics Nausea And Vomiting    Patient Measurements: Weight: 129 lb (58.514 kg) Height: 5'4.5"  Vital Signs: Temp: 98.4 F (36.9 C) (12/29 0810) Temp Source: Oral (12/29 0636) BP: 111/69 mmHg (12/29 1337) Pulse Rate: 104 (12/29 1337) Intake/Output from previous day:   Intake/Output from this shift:    Labs:  Recent Labs  12/05/15 0711 12/05/15 0721  WBC 24.5*  --   HGB 9.5* 11.6*  PLT 386  --   CREATININE  --  1.00   Estimated Creatinine Clearance: 47.5 mL/min (by C-G formula based on Cr of 1). No results for input(s): VANCOTROUGH, VANCOPEAK, VANCORANDOM, GENTTROUGH, GENTPEAK, GENTRANDOM, TOBRATROUGH, TOBRAPEAK, TOBRARND, AMIKACINPEAK, AMIKACINTROU, AMIKACIN in the last 72 hours.   Microbiology: No results found for this or any previous visit (from the past 720 hour(s)).  Medical History: Past Medical History  Diagnosis Date  . Crohn's disease ([place])   . Chronic diarrhea   . GERD (gastroesophageal reflux disease)   . Polyarthralgia   . Hiatal hernia   . Hemorrhoids   . Anemia   . Hiatal hernia     hiatal  . Pneumonia 2014  . Psoriasis     Assessment: 60 y/oF with PMH of Crohn's disease, rectovaginal fistula, chronic diarrhea, recently diagnosed UTI for which she is currently on Cipro who presents to [facility] ED with intractable nausea, vomiting, and diarrhea. CXR concerning for aspiration pneumonia. Pharmacy consulted to assist with dosing of Vancomycin and Zosyn for sepsis secondary to possible aspiration pneumonia vs. UTI.  12/29 >> Ceftriaxone x 1 in ED 12/29 >>  Levofloxacin x 1 in ED 12/29 >> Zosyn >> 12/29 >> Vancomycin >>    12/29 urine: sent C.diff PCR: ordered Gastrointestinal panel by PCR, stool: ordered  Strep pneumo/Legionella Ur Ag: ordered  Tmax: 99.22F WBC elevated at 24.5K SCr 1 with CrCl ~ 48 ml/min CG Lactic Acid: 1.9 PCT: IP HIV antibody: IP  Goal of Therapy:  Vancomycin trough level 15-20 mcg/ml  Appropriate antibiotic dosing for renal function and indication Eradication of infection  Plan:   Vancomycin 1g IV x 1 ordered per MD. Continue with Vancomycin 500mg  IV q12h thereafter.  Plan for Vancomycin trough level at steady state.  Zosyn 3.375g IV x 1 over 30 minutes, then Zosyn 3.375g IV q8h (infuse over 4 hours).  Monitor renal function, cultures, clinical course.   Lindell Spar, PharmD, BCPS Pager: [phone] 12/05/2015 2:18 PM

## 2015-12-05 NOTE — Progress Notes (Signed)
CRITICAL VALUE ALERT  Critical value received: Lactic acid  Date of notification:  12/05/15  Time of notification:  1700  Critical value read back:Yes.    Nurse who received alert:  Wess Botts  MD notified (1st page):  Devien  Time of first page:  41  MD notified (2nd page):  Time of second page:  Responding MD:    Time MD responded:

## 2015-12-05 NOTE — ED Notes (Signed)
Ambulated w/o difficulty to bathroom. No BM.

## 2015-12-06 ENCOUNTER — Encounter (HOSPITAL_COMMUNITY): Payer: Self-pay | Admitting: Internal Medicine

## 2015-12-06 DIAGNOSIS — R197 Diarrhea, unspecified: Secondary | ICD-10-CM

## 2015-12-06 DIAGNOSIS — N179 Acute kidney failure, unspecified: Secondary | ICD-10-CM | POA: Diagnosis present

## 2015-12-06 DIAGNOSIS — N183 Chronic kidney disease, stage 3 unspecified: Secondary | ICD-10-CM | POA: Diagnosis present

## 2015-12-06 DIAGNOSIS — J189 Pneumonia, unspecified organism: Secondary | ICD-10-CM

## 2015-12-06 DIAGNOSIS — A419 Sepsis, unspecified organism: Secondary | ICD-10-CM | POA: Diagnosis present

## 2015-12-06 DIAGNOSIS — J181 Lobar pneumonia, unspecified organism: Secondary | ICD-10-CM

## 2015-12-06 LAB — BASIC METABOLIC PANEL
Anion gap: 9 (ref 5–15)
BUN: 10 mg/dL (ref 6–20)
CO2: 14 mmol/L — ABNORMAL LOW (ref 22–32)
CREATININE: 1.29 mg/dL — AB (ref 0.44–1.00)
Calcium: 8.7 mg/dL — ABNORMAL LOW (ref 8.9–10.3)
Chloride: 116 mmol/L — ABNORMAL HIGH (ref 101–111)
GFR calc Af Amer: 48 mL/min — ABNORMAL LOW (ref >60)
GFR, EST NON AFRICAN AMERICAN: 42 mL/min — AB (ref >60)
Glucose, Bld: 73 mg/dL (ref 65–99)
Potassium: 4.1 mmol/L (ref 3.5–5.1)
SODIUM: 139 mmol/L (ref 135–145)

## 2015-12-06 LAB — CBC
HCT: 30.1 % — ABNORMAL LOW (ref 36.0–46.0)
Hemoglobin: 9.2 g/dL — ABNORMAL LOW (ref 12.0–15.0)
MCH: 25.1 pg — ABNORMAL LOW (ref 26.0–34.0)
MCHC: 30.6 g/dL (ref 30.0–36.0)
MCV: 82 fL (ref 78.0–100.0)
PLATELETS: 293 10*3/uL (ref 150–400)
RBC: 3.67 MIL/uL — ABNORMAL LOW (ref 3.87–5.11)
RDW: 15.5 % (ref 11.5–15.5)
WBC: 18.8 10*3/uL — AB (ref 4.0–10.5)

## 2015-12-06 LAB — URINE CULTURE

## 2015-12-06 LAB — LEGIONELLA ANTIGEN, URINE

## 2015-12-06 LAB — HIV ANTIBODY (ROUTINE TESTING W REFLEX): HIV SCREEN 4TH GENERATION: NONREACTIVE

## 2015-12-06 LAB — GLUCOSE, CAPILLARY: Glucose-Capillary: 82 mg/dL (ref 65–99)

## 2015-12-06 LAB — MRSA PCR SCREENING: MRSA BY PCR: POSITIVE — AB

## 2015-12-06 MED ORDER — VANCOMYCIN HCL IN DEXTROSE 750-5 MG/150ML-% IV SOLN
750.0000 mg | INTRAVENOUS | Status: DC
  Administered 2015-12-06 – 2015-12-08 (×3): 750 mg via INTRAVENOUS
  Filled 2015-12-06 (×6): qty 150

## 2015-12-06 MED ORDER — MAGNESIUM SULFATE 2 GM/50ML IV SOLN
2.0000 g | Freq: Once | INTRAVENOUS | Status: AC
  Administered 2015-12-06: 2 g via INTRAVENOUS
  Filled 2015-12-06: qty 50

## 2015-12-06 NOTE — Progress Notes (Addendum)
Patient ID: Danielle [name], female   DOB: [birth year], 69 y.o.   MRN: [id number] [facility] PROGRESS NOTE  SICILY ZARAGOZA WUJ:[id number] DOB: 08-Dec-1945 DOA: 12/05/2015 PCP: Maggie Font, MD  Brief narrative:    69 year old female with past medical history significant for Crohn's disease, follows with GI (Dr. 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 Leighton Ruff. She does have history of chronic diarrhea which is relatively controlled with Lomotil and Bentyl. She was recently diagnosed with urinary tract infection and was started on antibiotic which she still is taking. Over past 24 hours she started to experience intractable nausea, vomiting and diarrhea. She felt so sick and was unable to take her medications because of the symptoms. No fevers or chills.   In ED, blood pressure was stable, 114/63, no fevers. Her heart rate was 117. Blood work showed white blood cell count of 24.5, hemoglobin 11.6 otherwise unremarkable. Chest x-ray demonstrated extensive airspace consolidation throughout the right mid to lower lung and right lower lobe concerning for aspiration pneumonia. Urinalysis demonstrated leukocytes and few bacteria. Because of the reports of diarrhea stool was sent for C. difficile. Patient was started on broad-spectrum antibiotics as sepsis criteria met on the admission.  Barrier to discharge: while C. diff is negative she spiked fever overnight while on broad spectrum abx so it is reasonable to continue to monitor in hospital setting at least for next 24 - 48 hours.   Assessment/Plan:    Principal problem: Sepsis secondary to possible aspiration pneumonia / lobar pneumonia, unspecified organism / UTI / leukocytosis / Diarrhea  - Sepsis criteria met on the admission with tachycardia, leukocytosis. Lactic acid as highest 2.1. Procalcitonin was 0.27. - Source of infection - pneumonia versus urinary tract  infection. Urinalysis on the admission showed leukocytosis. Chest x-ray on the admission showed airspace consolidation throughout the right mid and lower lung lobe concerning for aspiration pneumonia - Stool for C. difficile is negative - Order for blood cultures placed on the admission however for some reason and not collected so order placed for blood cultures today. - Urine culture is pending - Strep pneumonia and legionella is negative    Active Problems: History of Crohn's disease - Patient gets infliximab infusion every 6 weeks by GI - Patient is on daily prednisone 20 mg   Anemia of chronic disease - Hemoglobin stable at 11.6  CKD (chronic kidney disease) stage 3, GFR 30-59 ml/min - Baseline Cr 1.9 and on this admission WNL but little up this am, 1.29 - Continue IV fluids  - Follow up BMP in am   Hypomagnesemia - Supplemented today   DVT prophylaxis:  - SCDs bilaterally  Code Status: Full.  Family Communication:  plan of care discussed with the patient Disposition Plan: Home once patient feels better and wants sepsis etiology resolves. In addition, she spiked fever overnight. Anticipated discharge by 12/08/2015.  IV access:  Peripheral IV  Procedures and diagnostic studies:    Dg Chest 2 View 12/05/2015  1. Extensive airspace consolidation throughout the right mid to lower lung, predominantly in the right lower lobe, concerning for aspiration pneumonia. 2. Enlarging massive hiatal hernia which contains both stomach and colon. 3. Mild cardiomegaly. Electronically Signed   By: Vinnie Langton M.D.   On: 12/05/2015 11:44    Medical Consultants:  None  Other Consultants:  None  IAnti-Infectives:   Vancomycin and Zosyn 12/05/2015 --.   Leisa Lenz, MD  [facility] Pager [phone]  Time spent in minutes: 25 minutes  If 7PM-7AM, please contact night-coverage [email or web address] Password [email or web address] 12/06/2015, 7:49 AM   LOS: 1 day    HPI/Subjective: No acute  overnight events. Patient reports she feels better.  Objective: Filed Vitals:   12/05/15 1537 12/05/15 2214 12/05/15 2318 12/06/15 0502  BP: 117/67 106/64  127/71  Pulse: 106 112  116  Temp: 98.2 F (36.8 C) 101.2 F (38.4 C) 100.9 F (38.3 C) 100.3 F (37.9 C)  TempSrc: Oral Axillary  Oral  Resp: _0 Height: _1  (1.626 m)     Weight: 136 lb 3.2 oz (61.78 kg)   129 lb 6.6 oz (58.7 kg)  SpO2: 99% 96%  95%    Intake/Output Summary (Last 24 hours) at 12/06/15 0749 Last data filed at 12/05/15 2300  Gross per 24 hour  Intake    705 ml  Output    400 ml  Net    305 ml    Exam:   General:  Pt is alert, follows commands appropriately, not in acute distress  Cardiovascular: Regular rate and rhythm, S1/S2 appreciated  Respiratory: No wheezing, no rhonchi  Abdomen: Soft, non tender, non distended, bowel sounds present  Extremities: No edema, pulses DP and PT palpable bilaterally  Neuro: Grossly nonfocal  Data Reviewed: Basic Metabolic Panel:  Recent Labs Lab 12/05/15 0721 12/05/15 1615 12/06/15 0455  NA 145 143 139  K 3.7 4.0 4.1  CL 119* 120* 116*  CO2  --  14* 14*  GLUCOSE 100* 106* 73  BUN _2 CREATININE 1.00 1.18* 1.29*  CALCIUM  --  8.4* 8.7*  MG  --  1.6*  --   PHOS  --  2.7  --    Liver Function Tests:  Recent Labs Lab 12/05/15 1615  AST 23  ALT 16  ALKPHOS 125  BILITOT 0.6  PROT 7.1  ALBUMIN 3.1*    Recent Labs Lab 12/05/15 0711  LIPASE 30   No results for input(s): AMMONIA in the last 168 hours. CBC:  Recent Labs Lab 12/05/15 0711 12/05/15 0721 12/05/15 1615 12/06/15 0455  WBC 24.5*  --  22.6* 18.8*  NEUTROABS 20.1*  --  17.8*  --   HGB 9.5* 11.6* 9.1* 9.2*  HCT 31.7* 34.0* 30.5* 30.1*  MCV 82.8  --  84.5 82.0  PLT 386  --  373 293   Cardiac Enzymes: No results for input(s): CKTOTAL, CKMB, CKMBINDEX, TROPONINI in the last 168 hours. BNP: Invalid input(s): POCBNP CBG: No results for input(s): GLUCAP in the  last 168 hours.  Recent Results (from the past 240 hour(s))  C difficile quick scan w PCR reflex     Status: None   Collection Time: 12/05/15  5:45 PM  Result Value Ref Range Status   C Diff antigen NEGATIVE NEGATIVE Final   C Diff toxin NEGATIVE NEGATIVE Final   C Diff interpretation Negative for toxigenic C. difficile  Final     Scheduled Meds: . dicyclomine  10 mg Oral TID AC  . magnesium oxide  400 mg Oral Daily  . multivitamin with minerals  1 tablet Oral Daily  . piperacillin-tazobactam (ZOSYN)  IV  3.375 g Intravenous Q8H  . potassium chloride  20 mEq Oral BID  . sodium chloride  3 mL Intravenous Q12H  . spironolactone  50 mg Oral Daily  . vancomycin  500 mg Intravenous Q12H   Continuous Infusions: . sodium chloride 75  mL/hr at 12/05/15 1904

## 2015-12-06 NOTE — Progress Notes (Signed)
Utilization review completed. Nayel Purdy, RN, BSN. 

## 2015-12-06 NOTE — Progress Notes (Signed)
CRITICAL VALUE ALERT  Critical value received :  MRSA Positive   Date of notification:  12/06/15  Time of notification:  1300  Critical value read back:Yes.    Nurse who received alert:  Rulon Abide   MD notified (1st page):  Charlies Silvers  Time of first page:  21  MD notified (2nd page):  Time of second page:  Responding MD:    Time MD responded:

## 2015-12-06 NOTE — Progress Notes (Signed)
Pharmacy Antibiotic Follow-up Note  Danielle [name] is a 69 y.o. year-old female admitted on 12/05/2015.  The patient is currently on day 2 of vancomycin and Zosyn for aspiration pneumonia and UTI.  Assessment/Plan:  The dose of Vancomycin will be adjusted to 750 mg IV q24h based on renal function.   Continue Zosyn 3.375g IV Q8H infused over 4hrs.   Measure Vanc trough at steady state.  Follow up renal fxn, culture results, and clinical course.    Temp (24hrs), Avg:100.2 F (37.9 C), Min:98.2 F (36.8 C), Max:101.2 F (38.4 C)   Recent Labs Lab 12/05/15 0711 12/05/15 1615 12/06/15 0455  WBC 24.5* 22.6* 18.8*    Recent Labs Lab 12/05/15 0721 12/05/15 1615 12/06/15 0455  CREATININE 1.00 1.18* 1.29*   Estimated Creatinine Clearance: 36 mL/min (by C-G formula based on Cr of 1.29).    Allergies  Allergen Reactions  . Zithromax [Azithromycin] Swelling    Patient states that she has a swollen mouth/ blisters, yeast present mouth and vaginia.  . Aspirin Other (See Comments)    Cramps her stomach  . Ibuprofen Swelling  . Sulfa Antibiotics Nausea And Vomiting    Antimicrobials this admission: 12/29 >> Ceftriaxone x 1 in ED 12/29 >> Levofloxacin x 1 in ED 12/29 >> Zosyn >> 12/29 >> Vancomycin >>   Levels/dose changes this admission: None  Microbiology results: 12/29 urine: in process 12/29 C.diff PCR: Toxin neg, Ag negative 12/29 Gastrointestinal panel by PCR, stool: in process  12/29 Strep pneumo/Legionella Ur Ag: negative/pending 12/30 MRSA PCR: in process 12/29 HIV antibody: non-reactive  Thank you for allowing pharmacy to be a part of this patient's care. Gretta Arab PharmD, BCPS Pager [phone] 12/06/2015 10:09 AM

## 2015-12-07 ENCOUNTER — Encounter (HOSPITAL_COMMUNITY): Payer: Self-pay | Admitting: Internal Medicine

## 2015-12-07 DIAGNOSIS — J189 Pneumonia, unspecified organism: Secondary | ICD-10-CM

## 2015-12-07 DIAGNOSIS — K449 Diaphragmatic hernia without obstruction or gangrene: Secondary | ICD-10-CM

## 2015-12-07 DIAGNOSIS — A419 Sepsis, unspecified organism: Principal | ICD-10-CM

## 2015-12-07 DIAGNOSIS — N179 Acute kidney failure, unspecified: Secondary | ICD-10-CM

## 2015-12-07 DIAGNOSIS — Z22322 Carrier or suspected carrier of Methicillin resistant Staphylococcus aureus: Secondary | ICD-10-CM

## 2015-12-07 DIAGNOSIS — D72829 Elevated white blood cell count, unspecified: Secondary | ICD-10-CM

## 2015-12-07 DIAGNOSIS — N183 Chronic kidney disease, stage 3 (moderate): Secondary | ICD-10-CM

## 2015-12-07 HISTORY — DX: Diaphragmatic hernia without obstruction or gangrene: K44.9

## 2015-12-07 LAB — GLUCOSE, CAPILLARY: Glucose-Capillary: 83 mg/dL (ref 65–99)

## 2015-12-07 MED ORDER — ONDANSETRON HCL 4 MG/2ML IJ SOLN
4.0000 mg | Freq: Once | INTRAMUSCULAR | Status: AC
  Administered 2015-12-07: 4 mg via INTRAVENOUS
  Filled 2015-12-07: qty 2

## 2015-12-07 MED ORDER — ALBUTEROL SULFATE (2.5 MG/3ML) 0.083% IN NEBU
2.5000 mg | INHALATION_SOLUTION | Freq: Four times a day (QID) | RESPIRATORY_TRACT | Status: DC
  Administered 2015-12-07 – 2015-12-08 (×5): 2.5 mg via RESPIRATORY_TRACT
  Filled 2015-12-07 (×5): qty 3

## 2015-12-07 NOTE — Progress Notes (Signed)
Progress Note   Danielle [name] OTL:[id number] DOB: 1946-07-15 DOA: 12/05/2015 PCP: Maggie Font, MD   Brief Narrative:   Danielle [name] is an 69 y.o. female with a PMH of Crohn's disease, follows with GI (Dr. 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 Leighton Ruff, chronic diarrhea which is relatively controlled with Lomotil and Bentyl, recent diagnosis of UTI treated with antibiotics, who was admitted 12/05/15 with a 24-hour history of intractable nausea, vomiting and diarrhea. In ED, blood pressure was stable, 114/63, no fevers. Her heart rate was 117. Blood work showed white blood cell count of 24.5, hemoglobin 11.6 otherwise unremarkable. Chest x-ray demonstrated extensive airspace consolidation throughout the right mid to lower lung and right lower lobe concerning for aspiration pneumonia. Urinalysis demonstrated leukocytes and few bacteria. Because of the reports of diarrhea stool was sent for C. difficile. Patient was started on broad-spectrum antibiotics as sepsis criteria met on the admission.  Barrier to discharge: while C. diff is negative she spiked fever overnight while on broad spectrum abx so it is reasonable to continue to monitor in hospital setting at least for next 24 - 48 hours.  Assessment/Plan:   Principal problem: Sepsis secondary to possible aspiration pneumonia / lobar pneumonia, unspecified organism / UTI / leukocytosis / Diarrhea  - Sepsis criteria met on the admission with tachycardia, leukocytosis. Lactic acid as highest 2.1. Procalcitonin was 0.27. - Source of infection - pneumonia vs UTI. - Stool for C. difficile is negative. - Follow-up blood and urine cultures. - Strep pneumonia and legionella is negative. - Continue empiric vancomycin and Zosyn for now (patient on immunosuppressive therapy).  Active Problems: Massive hiatal hernia - Monitor for symptoms.  MRSA carrier -  Decontamination therapy ordered.  History of Crohn's disease - Patient gets infliximab infusion every 6 weeks by GI. - Patient is on daily prednisone 20 mg.   Anemia of chronic disease - Hemoglobin stable.  CKD (chronic kidney disease) stage 3, GFR 30-59 ml/min - Baseline Cr 1.9 and on this admission WNL but little up this am, 1.29 - Continue IV fluids.   Hypomagnesemia - Supplemented.  DVT prophylaxis:  - SCDs bilaterally.   Family Communication/Anticipated D/C date and plan/Code Status   Family Communication: No family currently at the bedside. Disposition Plan: Home when afebrile x 24 hours and culture data back. Anticipated D/C date:   3 days. Code Status: Full code.   IV Access:    Peripheral IV   Procedures and diagnostic studies:   Dg Chest 2 View  12/05/2015  CLINICAL DATA:  69 year old female with history of abdominal pain common nausea, vomiting and diarrhea since yesterday evening. Shortness of breath with chills. EXAM: CHEST  2 VIEW COMPARISON:  Chest x-ray a 07/29/2015. FINDINGS: Extensive airspace consolidation throughout the right mid to lower lung new compared to the prior examination. Left lung is clear. No pleural effusions. No evidence of pulmonary edema. Heart size is mildly enlarged. Massive hiatal hernia, which appears slightly larger than the prior study and appears to contain both stomach and colon. Surgical clips near the thoracic inlet. IMPRESSION: 1. Extensive airspace consolidation throughout the right mid to lower lung, predominantly in the right lower lobe, concerning for aspiration pneumonia. 2. Enlarging massive hiatal hernia which contains both stomach and colon. 3. Mild cardiomegaly. Electronically Signed   By: Vinnie Langton M.D.   On: 12/05/2015 11:44     Medical Consultants:  None.  Anti-Infectives:    Vancomycin 12/05/15--->  Zosyn 12/05/15--->  Cefepime, Levaquin, and Rocephin 1  Subjective:   Danielle [name]  reports nausea, loss of appetite today.  She is still a bit short of breath and says Albuterol helps with this at home.  Has a mild cough productive of clear mucous.  No diarrhea.    Objective:    Filed Vitals:   12/06/15 1422 12/06/15 2116 12/07/15 0033 12/07/15 0443  BP:  122/63  108/58  Pulse:  104  98  Temp: 98.1 F (36.7 C) 102.6 F (39.2 C) 98.1 F (36.7 C) 98.3 F (36.8 C)  TempSrc:  Oral Oral Oral  Resp:  22  22  Height:      Weight:    59.421 kg (131 lb)  SpO2:  100%  100%    Intake/Output Summary (Last 24 hours) at 12/07/15 0800 Last data filed at 12/07/15 0524  Gross per 24 hour  Intake    490 ml  Output   2400 ml  Net  -1910 ml   Filed Weights   12/05/15 1537 12/06/15 0502 12/07/15 0443  Weight: 61.78 kg (136 lb 3.2 oz) 58.7 kg (129 lb 6.6 oz) 59.421 kg (131 lb)    Exam: Gen:  NAD Cardiovascular:  RRR, No M/R/G Respiratory:  Lungs course Gastrointestinal:  Abdomen soft, NT/ND, + BS Extremities:  No C/E/C   Data Reviewed:    Labs: Basic Metabolic Panel:  Recent Labs Lab 12/05/15 0721 12/05/15 1615 12/06/15 0455  NA 145 143 139  K 3.7 4.0 4.1  CL 119* 120* 116*  CO2  --  14* 14*  GLUCOSE 100* 106* 73  BUN _0 CREATININE 1.00 1.18* 1.29*  CALCIUM  --  8.4* 8.7*  MG  --  1.6*  --   PHOS  --  2.7  --    GFR Estimated Creatinine Clearance: 36 mL/min (by C-G formula based on Cr of 1.29). Liver Function Tests:  Recent Labs Lab 12/05/15 1615  AST 23  ALT 16  ALKPHOS 125  BILITOT 0.6  PROT 7.1  ALBUMIN 3.1*    Recent Labs Lab 12/05/15 0711  LIPASE 30   Coagulation profile  Recent Labs Lab 12/05/15 1615  INR 1.28    CBC:  Recent Labs Lab 12/05/15 0711 12/05/15 0721 12/05/15 1615 12/06/15 0455  WBC 24.5*  --  22.6* 18.8*  NEUTROABS 20.1*  --  17.8*  --   HGB 9.5* 11.6* 9.1* 9.2*  HCT 31.7* 34.0* 30.5* 30.1*  MCV 82.8  --  84.5 82.0  PLT 386  --  373 293   CBG:  Recent Labs Lab 12/06/15 0749  GLUCAP 82    Thyroid function studies:  Recent Labs  12/05/15 1615  TSH 1.404   Sepsis Labs:  Recent Labs Lab 12/05/15 0711 12/05/15 1329 12/05/15 1615 12/06/15 0455  PROCALCITON  --  0.27  --   --   WBC 24.5*  --  22.6* 18.8*  LATICACIDVEN  --  1.9 2.1*  --    Microbiology Recent Results (from the past 240 hour(s))  Urine culture     Status: None   Collection Time: 12/05/15 11:06 AM  Result Value Ref Range Status   Specimen Description URINE, CLEAN CATCH  Final   Special Requests NONE  Final   Culture   Final    MULTIPLE SPECIES PRESENT, SUGGEST RECOLLECTION Performed at [facility]    Report Status 12/06/2015 FINAL  Final  Urine culture     Status: None   Collection Time: 12/05/15  4:12 PM  Result Value Ref Range Status   Specimen Description URINE, CLEAN CATCH  Final   Special Requests NONE  Final   Culture   Final    MULTIPLE SPECIES PRESENT, SUGGEST RECOLLECTION Performed at [facility]    Report Status 12/06/2015 FINAL  Final  C difficile quick scan w PCR reflex     Status: None   Collection Time: 12/05/15  5:45 PM  Result Value Ref Range Status   C Diff antigen NEGATIVE NEGATIVE Final   C Diff toxin NEGATIVE NEGATIVE Final   C Diff interpretation Negative for toxigenic C. difficile  Final  MRSA PCR Screening     Status: Abnormal   Collection Time: 12/06/15  9:06 AM  Result Value Ref Range Status   MRSA by PCR POSITIVE (A) NEGATIVE Final    Comment:        The GeneXpert MRSA Assay (FDA approved for NASAL specimens only), is one component of a comprehensive MRSA colonization surveillance program. It is not intended to diagnose MRSA infection nor to guide or monitor treatment for MRSA infections. RESULT CALLED TO, READ BACK BY AND VERIFIED WITH: J.BURNS RN AT 6861 ON 12.30.16 BY SHUEA      Medications:   . dicyclomine  10 mg Oral TID AC  . magnesium oxide  400 mg Oral Daily  . multivitamin with minerals  1 tablet Oral Daily  .  piperacillin-tazobactam (ZOSYN)  IV  3.375 g Intravenous Q8H  . potassium chloride  20 mEq Oral BID  . sodium chloride  3 mL Intravenous Q12H  . spironolactone  50 mg Oral Daily  . vancomycin  750 mg Intravenous Q24H   Continuous Infusions: . sodium chloride 75 mL/hr at 12/07/15 0310    Time spent: 35 minutes with > 50% of time discussing current diagnostic test results, clinical impression and plan of care.   LOS: 2 days   Vang Kraeger  [facility] Pager [phone]. If unable to reach me by pager, please call my cell phone at [phone].  *Please refer to [email or web address], password [email or web address] to get updated schedule on who will round on this patient, as hospitalists switch teams weekly. If 7PM-7AM, please contact night-coverage at [email or web address], password [email or web address] for any overnight needs.  12/07/2015, 8:00 AM          \

## 2015-12-08 DIAGNOSIS — K50913 Crohn's disease, unspecified, with fistula: Secondary | ICD-10-CM

## 2015-12-08 LAB — BASIC METABOLIC PANEL
ANION GAP: 6 (ref 5–15)
BUN: 8 mg/dL (ref 6–20)
CALCIUM: 8.3 mg/dL — AB (ref 8.9–10.3)
CO2: 16 mmol/L — AB (ref 22–32)
Chloride: 113 mmol/L — ABNORMAL HIGH (ref 101–111)
Creatinine, Ser: 1.21 mg/dL — ABNORMAL HIGH (ref 0.44–1.00)
GFR calc Af Amer: 52 mL/min — ABNORMAL LOW (ref >60)
GFR calc non Af Amer: 45 mL/min — ABNORMAL LOW (ref >60)
GLUCOSE: 87 mg/dL (ref 65–99)
Potassium: 4.3 mmol/L (ref 3.5–5.1)
Sodium: 135 mmol/L (ref 135–145)

## 2015-12-08 LAB — GLUCOSE, CAPILLARY
GLUCOSE-CAPILLARY: 92 mg/dL (ref 65–99)
Glucose-Capillary: 89 mg/dL (ref 65–99)

## 2015-12-08 LAB — CBC
HCT: 26.4 % — ABNORMAL LOW (ref 36.0–46.0)
HEMOGLOBIN: 8.1 g/dL — AB (ref 12.0–15.0)
MCH: 25.4 pg — AB (ref 26.0–34.0)
MCHC: 30.7 g/dL (ref 30.0–36.0)
MCV: 82.8 fL (ref 78.0–100.0)
Platelets: 253 10*3/uL (ref 150–400)
RBC: 3.19 MIL/uL — ABNORMAL LOW (ref 3.87–5.11)
RDW: 15.4 % (ref 11.5–15.5)
WBC: 15.3 10*3/uL — ABNORMAL HIGH (ref 4.0–10.5)

## 2015-12-08 MED ORDER — ZOLPIDEM TARTRATE 5 MG PO TABS
5.0000 mg | ORAL_TABLET | Freq: Once | ORAL | Status: AC
  Administered 2015-12-08: 5 mg via ORAL
  Filled 2015-12-08: qty 1

## 2015-12-08 MED ORDER — ALBUTEROL SULFATE (2.5 MG/3ML) 0.083% IN NEBU
2.5000 mg | INHALATION_SOLUTION | Freq: Three times a day (TID) | RESPIRATORY_TRACT | Status: DC
  Administered 2015-12-08 – 2015-12-09 (×3): 2.5 mg via RESPIRATORY_TRACT
  Filled 2015-12-08 (×3): qty 3

## 2015-12-08 MED ORDER — DIPHENOXYLATE-ATROPINE 2.5-0.025 MG PO TABS
2.0000 | ORAL_TABLET | Freq: Two times a day (BID) | ORAL | Status: DC
  Administered 2015-12-08 – 2015-12-09 (×3): 2 via ORAL
  Filled 2015-12-08 (×3): qty 2

## 2015-12-08 NOTE — Progress Notes (Signed)
Progress Note   Danielle [name]:[id number] DOB: Sep 14, 1946 DOA: 12/05/2015 PCP: Maggie Font, MD   Brief Narrative:   Danielle [name] is an 70 y.o. female with a PMH of Crohn's disease, follows with GI (Dr. 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 Leighton Ruff, chronic diarrhea which is relatively controlled with Lomotil and Bentyl, recent diagnosis of UTI treated with antibiotics, who was admitted 12/05/15 with a 24-hour history of intractable nausea, vomiting and diarrhea. In ED, blood pressure was stable, 114/63, no fevers. Her heart rate was 117. Blood work showed white blood cell count of 24.5, hemoglobin 11.6 otherwise unremarkable. Chest x-ray demonstrated extensive airspace consolidation throughout the right mid to lower lung and right lower lobe concerning for aspiration pneumonia. Urinalysis demonstrated leukocytes and few bacteria. Because of the reports of diarrhea stool was sent for C. difficile. Patient was started on broad-spectrum antibiotics as sepsis criteria met on the admission.  Barrier to discharge: while C. diff is negative she spiked fever overnight while on broad spectrum abx so it is reasonable to continue to monitor in hospital setting at least for next 24 - 48 hours.  Assessment/Plan:   Principal problem: Sepsis secondary to possible aspiration pneumonia / lobar pneumonia, unspecified organism / UTI / leukocytosis / Diarrhea  - Sepsis criteria met on the admission with tachycardia, leukocytosis. Lactic acid as highest 2.1. Procalcitonin was 0.27. - Source of infection - pneumonia vs UTI. - Stool for C. Difficile negative. - Urine cultures grew multiple species, blood cultures negative to date. - Strep pneumonia and legionella negative. - Continue empiric vancomycin and Zosyn for now (patient on immunosuppressive therapy and is a MRSA carrier).  Active Problems: Massive hiatal  hernia - Monitor for symptoms.  MRSA carrier - Decontamination therapy ordered.  History of Crohn's disease - Patient gets infliximab infusion every 6 weeks by GI. - Patient is on daily prednisone 20 mg.   Anemia of chronic disease - Hemoglobin stable.  CKD (chronic kidney disease) stage 3, GFR 30-59 ml/min - Baseline Cr 1.9, Currently stable. - Continue IV fluids.   Hypomagnesemia - Supplemented.  DVT prophylaxis:  - SCDs bilaterally.   Family Communication/Anticipated D/C date and plan/Code Status   Family Communication: No family currently at the bedside. Disposition Plan: Home when afebrile x 24 hours and culture data back. Anticipated D/C date:   1-2 days. Code Status: Full code.   IV Access:    Peripheral IV   Procedures and diagnostic studies:   Dg Chest 2 View  12/05/2015  CLINICAL DATA:  70 year old female with history of abdominal pain common nausea, vomiting and diarrhea since yesterday evening. Shortness of breath with chills. EXAM: CHEST  2 VIEW COMPARISON:  Chest x-ray a 07/29/2015. FINDINGS: Extensive airspace consolidation throughout the right mid to lower lung new compared to the prior examination. Left lung is clear. No pleural effusions. No evidence of pulmonary edema. Heart size is mildly enlarged. Massive hiatal hernia, which appears slightly larger than the prior study and appears to contain both stomach and colon. Surgical clips near the thoracic inlet. IMPRESSION: 1. Extensive airspace consolidation throughout the right mid to lower lung, predominantly in the right lower lobe, concerning for aspiration pneumonia. 2. Enlarging massive hiatal hernia which contains both stomach and colon. 3. Mild cardiomegaly. Electronically Signed   By: Vinnie Langton M.D.   On: 12/05/2015 11:44     Medical Consultants:  None.  Anti-Infectives:    Vancomycin 12/05/15--->  Zosyn 12/05/15--->  Cefepime, Levaquin, and Rocephin 1  Subjective:    Danielle [name] says her breathing is better since starting on the nebulized bronchodilators.  Having some diarrhea today.  No N/V.  Appetite "pretty good".  No reports of pain except occasional abdominal pain.  Objective:    Filed Vitals:   12/07/15 1932 12/07/15 2024 12/08/15 0526 12/08/15 0811  BP:  104/60 101/55   Pulse:  108 101   Temp:  99.9 F (37.7 C) 99.4 F (37.4 C)   TempSrc:  Oral Oral   Resp:  18 18   Height:      Weight:   59.603 kg (131 lb 6.4 oz)   SpO2: 97% 95% 97% 99%    Intake/Output Summary (Last 24 hours) at 12/08/15 0831 Last data filed at 12/08/15 0704  Gross per 24 hour  Intake 2472.5 ml  Output    650 ml  Net 1822.5 ml   Filed Weights   12/06/15 0502 12/07/15 0443 12/08/15 0526  Weight: 58.7 kg (129 lb 6.6 oz) 59.421 kg (131 lb) 59.603 kg (131 lb 6.4 oz)    Exam: Gen:  NAD Cardiovascular:  RRR, No M/R/G Respiratory:  Lungs course Gastrointestinal:  Abdomen soft, NT/ND, + BS Extremities:  No C/E/C   Data Reviewed:    Labs: Basic Metabolic Panel:  Recent Labs Lab 12/05/15 0721 12/05/15 1615 12/06/15 0455 12/08/15 0517  NA 145 143 139 135  K 3.7 4.0 4.1 4.3  CL 119* 120* 116* 113*  CO2  --  14* 14* 16*  GLUCOSE 100* 106* 73 87  BUN _0 CREATININE 1.00 1.18* 1.29* 1.21*  CALCIUM  --  8.4* 8.7* 8.3*  MG  --  1.6*  --   --   PHOS  --  2.7  --   --    GFR Estimated Creatinine Clearance: 38.4 mL/min (by C-G formula based on Cr of 1.21). Liver Function Tests:  Recent Labs Lab 12/05/15 1615  AST 23  ALT 16  ALKPHOS 125  BILITOT 0.6  PROT 7.1  ALBUMIN 3.1*    Recent Labs Lab 12/05/15 0711  LIPASE 30   Coagulation profile  Recent Labs Lab 12/05/15 1615  INR 1.28    CBC:  Recent Labs Lab 12/05/15 0711 12/05/15 0721 12/05/15 1615 12/06/15 0455 12/08/15 0517  WBC 24.5*  --  22.6* 18.8* 15.3*  NEUTROABS 20.1*  --  17.8*  --   --   HGB 9.5* 11.6* 9.1* 9.2* 8.1*  HCT 31.7* 34.0* 30.5* 30.1* 26.4*   MCV 82.8  --  84.5 82.0 82.8  PLT 386  --  373 293 253   CBG:  Recent Labs Lab 12/06/15 0749 12/07/15 0800 12/08/15 0752  GLUCAP 82 83 92   Thyroid function studies:  Recent Labs  12/05/15 1615  TSH 1.404   Sepsis Labs:  Recent Labs Lab 12/05/15 0711 12/05/15 1329 12/05/15 1615 12/06/15 0455 12/08/15 0517  PROCALCITON  --  0.27  --   --   --   WBC 24.5*  --  22.6* 18.8* 15.3*  LATICACIDVEN  --  1.9 2.1*  --   --    Microbiology Recent Results (from the past 240 hour(s))  Urine culture     Status: None   Collection Time: 12/05/15 11:06 AM  Result Value Ref Range Status   Specimen Description URINE, CLEAN CATCH  Final   Special Requests NONE  Final   Culture   Final    MULTIPLE SPECIES PRESENT, SUGGEST RECOLLECTION Performed at [facility]    Report Status 12/06/2015 FINAL  Final  Urine culture     Status: None   Collection Time: 12/05/15  4:12 PM  Result Value Ref Range Status   Specimen Description URINE, CLEAN CATCH  Final   Special Requests NONE  Final   Culture   Final    MULTIPLE SPECIES PRESENT, SUGGEST RECOLLECTION Performed at [facility]    Report Status 12/06/2015 FINAL  Final  C difficile quick scan w PCR reflex     Status: None   Collection Time: 12/05/15  5:45 PM  Result Value Ref Range Status   C Diff antigen NEGATIVE NEGATIVE Final   C Diff toxin NEGATIVE NEGATIVE Final   C Diff interpretation Negative for toxigenic C. difficile  Final  MRSA PCR Screening     Status: Abnormal   Collection Time: 12/06/15  9:06 AM  Result Value Ref Range Status   MRSA by PCR POSITIVE (A) NEGATIVE Final    Comment:        The GeneXpert MRSA Assay (FDA approved for NASAL specimens only), is one component of a comprehensive MRSA colonization surveillance program. It is not intended to diagnose MRSA infection nor to guide or monitor treatment for MRSA infections. RESULT CALLED TO, READ BACK BY AND VERIFIED WITH: J.BURNS RN AT 3710 ON  12.30.16 BY SHUEA   Culture, blood (routine x 2)     Status: None (Preliminary result)   Collection Time: 12/06/15 12:55 PM  Result Value Ref Range Status   Specimen Description BLOOD LEFT HAND  Final   Special Requests IN PEDIATRIC BOTTLE 4CC  Final   Culture   Final    NO GROWTH < 24 HOURS Performed at [facility]    Report Status PENDING  Incomplete  Culture, blood (routine x 2)     Status: None (Preliminary result)   Collection Time: 12/06/15  1:05 PM  Result Value Ref Range Status   Specimen Description BLOOD RIGHT HAND  Final   Special Requests BOTTLES DRAWN AEROBIC ONLY [place]  Final   Culture   Final    NO GROWTH < 24 HOURS Performed at [facility]    Report Status PENDING  Incomplete     Medications:   . albuterol  2.5 mg Nebulization TID  . dicyclomine  10 mg Oral TID AC  . magnesium oxide  400 mg Oral Daily  . multivitamin with minerals  1 tablet Oral Daily  . piperacillin-tazobactam (ZOSYN)  IV  3.375 g Intravenous Q8H  . potassium chloride  20 mEq Oral BID  . sodium chloride  3 mL Intravenous Q12H  . spironolactone  50 mg Oral Daily  . vancomycin  750 mg Intravenous Q24H   Continuous Infusions: . sodium chloride 75 mL/hr at 12/08/15 0750    Time spent: 25 minutes.   LOS: 3 days   [facility] Pager [phone]. If unable to reach me by pager, please call my cell phone at [phone].  *Please refer to [email or web address], password [email or web address] to get updated schedule on who will round on this patient, as hospitalists switch teams weekly. If 7PM-7AM, please contact night-coverage at [email or web address], password [email or web address] for any overnight needs.  12/08/2015, 8:31 AM          \

## 2015-12-09 DIAGNOSIS — D899 Disorder involving the immune mechanism, unspecified: Secondary | ICD-10-CM

## 2015-12-09 DIAGNOSIS — D849 Immunodeficiency, unspecified: Secondary | ICD-10-CM | POA: Diagnosis present

## 2015-12-09 LAB — BASIC METABOLIC PANEL
Anion gap: 8 (ref 5–15)
BUN: 6 mg/dL (ref 6–20)
CHLORIDE: 110 mmol/L (ref 101–111)
CO2: 15 mmol/L — AB (ref 22–32)
CREATININE: 1.11 mg/dL — AB (ref 0.44–1.00)
Calcium: 8.6 mg/dL — ABNORMAL LOW (ref 8.9–10.3)
GFR calc non Af Amer: 50 mL/min — ABNORMAL LOW (ref >60)
GFR, EST AFRICAN AMERICAN: 58 mL/min — AB (ref >60)
GLUCOSE: 99 mg/dL (ref 65–99)
Potassium: 4.9 mmol/L (ref 3.5–5.1)
Sodium: 133 mmol/L — ABNORMAL LOW (ref 135–145)

## 2015-12-09 LAB — CBC
HEMATOCRIT: 28.6 % — AB (ref 36.0–46.0)
HEMOGLOBIN: 8.8 g/dL — AB (ref 12.0–15.0)
MCH: 25.1 pg — ABNORMAL LOW (ref 26.0–34.0)
MCHC: 30.8 g/dL (ref 30.0–36.0)
MCV: 81.5 fL (ref 78.0–100.0)
Platelets: 269 10*3/uL (ref 150–400)
RBC: 3.51 MIL/uL — ABNORMAL LOW (ref 3.87–5.11)
RDW: 15 % (ref 11.5–15.5)
WBC: 12.1 10*3/uL — ABNORMAL HIGH (ref 4.0–10.5)

## 2015-12-09 LAB — GLUCOSE, CAPILLARY: Glucose-Capillary: 75 mg/dL (ref 65–99)

## 2015-12-09 MED ORDER — ALBUTEROL SULFATE (2.5 MG/3ML) 0.083% IN NEBU: 2.5000 mg | INHALATION_SOLUTION | Freq: Four times a day (QID) | RESPIRATORY_TRACT | Status: DC | PRN

## 2015-12-09 MED ORDER — ZOLPIDEM TARTRATE 5 MG PO TABS: 5.0000 mg | ORAL_TABLET | Freq: Every evening | ORAL | Status: DC | PRN

## 2015-12-09 MED ORDER — SPIRONOLACTONE 50 MG PO TABS: 50.0000 mg | ORAL_TABLET | Freq: Every day | ORAL | Status: DC

## 2015-12-09 MED ORDER — BENZONATATE 100 MG PO CAPS: 100.0000 mg | ORAL_CAPSULE | Freq: Three times a day (TID) | ORAL | Status: DC | PRN

## 2015-12-09 MED ORDER — ALBUTEROL SULFATE HFA 108 (90 BASE) MCG/ACT IN AERS: 2.0000 | INHALATION_SPRAY | Freq: Four times a day (QID) | RESPIRATORY_TRACT | Status: AC | PRN

## 2015-12-09 MED ORDER — ONDANSETRON HCL 4 MG PO TABS: 4.0000 mg | ORAL_TABLET | Freq: Three times a day (TID) | ORAL | Status: DC | PRN

## 2015-12-09 NOTE — Discharge Summary (Signed)
Physician Discharge Summary  Danielle [name] XBW:[id number] DOB: [birth year] DOA: 12/05/2015  PCP: Maggie Font, MD  Admit date: 12/05/2015 Discharge date: 12/09/2015   Recommendations for Outpatient Follow-Up:   1. F/U with PCP in 2 weeks.  PCP please check final blood culture results.   Discharge Diagnosis:   Principal Problem:    Sepsis due to pneumonia [facility]) in a patient with immunosuppressed status Active Problems:    Leukocytosis    ARF (acute renal failure) (HCC)    Hypomagnesemia    Crohn's disease with fistula (HCC)    CKD (chronic kidney disease) stage 3, GFR 30-59 ml/min    MRSA carrier    Hiatal hernia    Chronic immunosuppression   Discharge disposition:  Home.    Discharge Condition: Improved.  Diet recommendation: Regular.   History of Present Illness:   Danielle [name] is an 70 y.o. female with a PMH of Crohn's disease, follows with GI (Dr. 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 Leighton Ruff, chronic diarrhea which is relatively controlled with Lomotil and Bentyl, recent diagnosis of UTI treated with antibiotics, who was admitted 12/05/15 with a 24-hour history of intractable nausea, vomiting and diarrhea. In ED, blood pressure was stable, 114/63, no fevers. Her heart rate was 117. Blood work showed white blood cell count of 24.5, hemoglobin 11.6 otherwise unremarkable. Chest x-ray demonstrated extensive airspace consolidation throughout the right mid to lower lung and right lower lobe concerning for aspiration pneumonia. Urinalysis demonstrated leukocytes and few bacteria. Because of the reports of diarrhea stool was sent for C. difficile. Patient was started on broad-spectrum antibiotics as sepsis criteria met on the admission.  Hospital Course by Problem:   Principal problem: Sepsis secondary to possible aspiration pneumonia / lobar pneumonia, unspecified organism /  UTI / leukocytosis / Diarrhea  - Sepsis criteria met on the admission with tachycardia, leukocytosis. Lactic acid as highest 2.1. Procalcitonin was 0.27. - Source of infection - pneumonia on CXR - Stool for C. Difficile negative. - Urine cultures grew multiple species, blood cultures negative to date. - Strep pneumonia and legionella negative. - Treated with empiric vancomycin and Zosyn for now (patient on immunosuppressive therapy and is a MRSA carrier). - OK to D/C home on 6 more days of treatment with oral Cipro (has already filled script for this PTA).  Active Problems: Massive hiatal hernia - Asymptomatic.  MRSA carrier - Decontamination therapy provided.  History of Crohn's disease / chronic immunosuppression - Patient gets infliximab infusion every 6 weeks by GI. - Patient is on daily prednisone 20 mg.   Anemia of chronic disease - Hemoglobin stable.  CKD (chronic kidney disease) stage 3, GFR 30-59 ml/min - Baseline Cr 1.9, Currently stable.  Hypomagnesemia - Supplemented.  Medical Consultants:    None.   Discharge Exam:   Filed Vitals:   12/09/15 0742 12/09/15 0814  BP:  98/58  Pulse: 96 95  Temp:    Resp: 18    Filed Vitals:   12/08/15 1939 12/09/15 0401 12/09/15 0742 12/09/15 0814  BP: 95/53 93/49  98/58  Pulse: 96 103 96 95  Temp: 98.2 F (36.8 C) 98.1 F (36.7 C)    TempSrc: Oral Oral    Resp: _0 Height:      Weight:  54.658 kg (120 lb 8 oz)    SpO2: 100% 98% 96%     Gen:  NAD Cardiovascular:  RRR,  No M/R/G Respiratory: Lungs CTAB Gastrointestinal: Abdomen soft, NT/ND with normal active bowel sounds. Extremities: No C/E/C   The results of significant diagnostics from this hospitalization (including imaging, microbiology, ancillary and laboratory) are listed below for reference.     Procedures and Diagnostic Studies:   Dg Chest 2 View  12/05/2015  CLINICAL DATA:  70 year old female with history of abdominal pain common nausea,  vomiting and diarrhea since yesterday evening. Shortness of breath with chills. EXAM: CHEST  2 VIEW COMPARISON:  Chest x-ray a 07/29/2015. FINDINGS: Extensive airspace consolidation throughout the right mid to lower lung new compared to the prior examination. Left lung is clear. No pleural effusions. No evidence of pulmonary edema. Heart size is mildly enlarged. Massive hiatal hernia, which appears slightly larger than the prior study and appears to contain both stomach and colon. Surgical clips near the thoracic inlet. IMPRESSION: 1. Extensive airspace consolidation throughout the right mid to lower lung, predominantly in the right lower lobe, concerning for aspiration pneumonia. 2. Enlarging massive hiatal hernia which contains both stomach and colon. 3. Mild cardiomegaly. Electronically Signed   By: Vinnie Langton M.D.   On: 12/05/2015 11:44     Labs:   Basic Metabolic Panel:  Recent Labs Lab 12/05/15 0721 12/05/15 1615 12/06/15 0455 12/08/15 0517 12/09/15 0442  NA 145 143 139 135 133*  K 3.7 4.0 4.1 4.3 4.9  CL 119* 120* 116* 113* 110  CO2  --  14* 14* 16* 15*  GLUCOSE 100* 106* 73 87 99  BUN _0 CREATININE 1.00 1.18* 1.29* 1.21* 1.11*  CALCIUM  --  8.4* 8.7* 8.3* 8.6*  MG  --  1.6*  --   --   --   PHOS  --  2.7  --   --   --    GFR Estimated Creatinine Clearance: 41.9 mL/min (by C-G formula based on Cr of 1.11). Liver Function Tests:  Recent Labs Lab 12/05/15 1615  AST 23  ALT 16  ALKPHOS 125  BILITOT 0.6  PROT 7.1  ALBUMIN 3.1*    Recent Labs Lab 12/05/15 0711  LIPASE 30   Coagulation profile  Recent Labs Lab 12/05/15 1615  INR 1.28    CBC:  Recent Labs Lab 12/05/15 0711 12/05/15 0721 12/05/15 1615 12/06/15 0455 12/08/15 0517 12/09/15 0442  WBC 24.5*  --  22.6* 18.8* 15.3* 12.1*  NEUTROABS 20.1*  --  17.8*  --   --   --   HGB 9.5* 11.6* 9.1* 9.2* 8.1* 8.8*  HCT 31.7* 34.0* 30.5* 30.1* 26.4* 28.6*  MCV 82.8  --  84.5 82.0 82.8 81.5    PLT 386  --  373 293 253 269   CBG:  Recent Labs Lab 12/06/15 0749 12/07/15 0800 12/08/15 0752 12/08/15 1210 12/09/15 0734  GLUCAP 82 83 92 89 75   Microbiology Recent Results (from the past 240 hour(s))  Urine culture     Status: None   Collection Time: 12/05/15 11:06 AM  Result Value Ref Range Status   Specimen Description URINE, CLEAN CATCH  Final   Special Requests NONE  Final   Culture   Final    MULTIPLE SPECIES PRESENT, SUGGEST RECOLLECTION Performed at [facility]    Report Status 12/06/2015 FINAL  Final  Urine culture     Status: None   Collection Time: 12/05/15  4:12 PM  Result Value Ref Range Status   Specimen Description URINE, CLEAN CATCH  Final   Special Requests  NONE  Final   Culture   Final    MULTIPLE SPECIES PRESENT, SUGGEST RECOLLECTION Performed at [facility]    Report Status 12/06/2015 FINAL  Final  C difficile quick scan w PCR reflex     Status: None   Collection Time: 12/05/15  5:45 PM  Result Value Ref Range Status   C Diff antigen NEGATIVE NEGATIVE Final   C Diff toxin NEGATIVE NEGATIVE Final   C Diff interpretation Negative for toxigenic C. difficile  Final  MRSA PCR Screening     Status: Abnormal   Collection Time: 12/06/15  9:06 AM  Result Value Ref Range Status   MRSA by PCR POSITIVE (A) NEGATIVE Final    Comment:        The GeneXpert MRSA Assay (FDA approved for NASAL specimens only), is one component of a comprehensive MRSA colonization surveillance program. It is not intended to diagnose MRSA infection nor to guide or monitor treatment for MRSA infections. RESULT CALLED TO, READ BACK BY AND VERIFIED WITH: J.BURNS RN AT 4782 ON 12.30.16 BY SHUEA   Culture, blood (routine x 2)     Status: None (Preliminary result)   Collection Time: 12/06/15 12:55 PM  Result Value Ref Range Status   Specimen Description BLOOD LEFT HAND  Final   Special Requests IN PEDIATRIC BOTTLE 4CC  Final   Culture   Final    NO GROWTH  3 DAYS Performed at [facility]    Report Status PENDING  Incomplete  Culture, blood (routine x 2)     Status: None (Preliminary result)   Collection Time: 12/06/15  1:05 PM  Result Value Ref Range Status   Specimen Description BLOOD RIGHT HAND  Final   Special Requests BOTTLES DRAWN AEROBIC ONLY [place]  Final   Culture   Final    NO GROWTH 3 DAYS Performed at [facility]    Report Status PENDING  Incomplete     Discharge Instructions:   Discharge Instructions    Call MD for:  difficulty breathing, headache or visual disturbances    Complete by:  As directed      Call MD for:  extreme fatigue    Complete by:  As directed      Call MD for:  temperature >100.4    Complete by:  As directed      Diet - low sodium heart healthy    Complete by:  As directed      Discharge instructions    Complete by:  As directed   Take Cipro for 6 more days (you have this antibiotic at home).  You were treated for pneumonia while you were in the hospital.  It is important that you see your PCP in follow up, and have him/her order a follow up chest x-ray in 4-6 weeks to ensure resolution of the pneumonia and to exclude any underlying pathology.  Take all of your antibiotics, as prescribed, even if you feel better.  Do not discontinue antibiotics prematurely.     Increase activity slowly    Complete by:  As directed             Medication List    STOP taking these medications        predniSONE 20 MG tablet  Commonly known as:  DELTASONE      TAKE these medications        acetaminophen 500 MG tablet  Commonly known as:  TYLENOL  Take 1,000 mg by  mouth every 6 (six) hours as needed for mild pain or moderate pain.     albuterol 108 (90 Base) MCG/ACT inhaler  Commonly known as:  PROVENTIL HFA;VENTOLIN HFA  Inhale 2 puffs into the lungs every 6 (six) hours as needed for wheezing.     BENEFIBER DRINK MIX Pack  Take 4 g by mouth at bedtime.     CALCIUM + D PO  Take 1  tablet by mouth daily.     ciprofloxacin 500 MG tablet  Commonly known as:  CIPRO  TAKE 1 TABLET (500 MG TOTAL) BY MOUTH 2 (TWO) TIMES DAILY.     dicyclomine 10 MG/5ML syrup  Commonly known as:  BENTYL  TAKE 2 TEASPOONFUL BY MOUTH 3 TIMES A DAY BEFORE MEALS     diphenhydrAMINE 25 mg capsule  Commonly known as:  BENADRYL  Take 25 mg by mouth every 6 (six) hours as needed for itching.     diphenoxylate-atropine 2.5-0.025 MG tablet  Commonly known as:  LOMOTIL  Take 2 tablets by mouth 2 (two) times daily.     HYDROcodone-acetaminophen 7.5-325 MG tablet  Commonly known as:  NORCO  Take 1 tablet by mouth every 4 (four) hours as needed for moderate pain.     inFLIXimab 100 MG injection  Commonly known as:  REMICADE  Inject 100 mg into the vein every 6 (six) weeks.     magnesium oxide 400 (241.3 Mg) MG tablet  Commonly known as:  MAG-OX  Take 1 tablet (400 mg total) by mouth daily.     multivitamin with minerals Tabs tablet  Take 1 tablet by mouth daily.     ondansetron 4 MG tablet  Commonly known as:  ZOFRAN  Take 1 tablet (4 mg total) by mouth every 8 (eight) hours as needed for nausea or vomiting.     pantoprazole 40 MG tablet  Commonly known as:  PROTONIX  TAKE 1 TABLET (40 MG TOTAL) BY MOUTH 2 (TWO) TIMES DAILY BEFORE A MEAL.     potassium chloride 20 MEQ/15ML (10%) Soln  Take 15 mLs (20 mEq total) by mouth 2 (two) times daily.     spironolactone 50 MG tablet  Commonly known as:  ALDACTONE  Take 1 tablet (50 mg total) by mouth daily.     VITAMIN B-12 IJ  Inject 1 each as directed every 30 (thirty) days. Around the 1st week of each month     zolpidem 5 MG tablet  Commonly known as:  AMBIEN  Take 1 tablet (5 mg total) by mouth at bedtime as needed for sleep.           Follow-up Information    Follow up with [facility] K, MD. Schedule an appointment as soon as possible for a visit in 2 weeks.   Specialty:  Family Medicine   Why:  Hospital follow up.    Contact information:   [address] [place] [address] [phone]        Time coordinating discharge: 35 minutes.  Signed:  RAMA,CHRISTINA  Pager [phone] [facility] 12/09/2015, 11:19 AM

## 2015-12-09 NOTE — Discharge Instructions (Signed)
Antibiotic Medicine °Antibiotic medicines are used to treat infections caused by bacteria. They work by hurting or killing the germs that are making you sick. °HOW WILL MY MEDICINE BE PICKED? °There are many kinds of antibiotic medicines. To help your doctor pick one, tell your doctor if: °· You have any allergies. °· You are pregnant or plan to get pregnant. °· You are breastfeeding. °· You are taking any medicines. These include over-the-counter medicines, prescription medicines, and herbal remedies. °· You have a medical condition or problem. °If you have questions about why your medicine was picked, ask. °FOR HOW LONG SHOULD I TAKE MY MEDICINE? °Take your medicine for as long as your doctor tells you to. Do not stop taking it when you feel better. If you stop taking it too soon: °· You may start to feel sick again. °· Your infection may get harder to treat. °· New problems may develop. °WHAT IF I MISS A DOSE? °Try not to miss any doses of antibiotic medicine. If you miss a dose: °· Take the dose as soon as you can. °· If you are taking 2 doses a day, take the next dose in 5 to 6 hours. °· If you are taking 3 or more doses a day, take the next dose in 2 to 4 hours. Then go back to the normal schedule. °If you cannot take a missed dose, take the next dose on time. Then take the missed dose after you have taken all the doses as told by your doctor, as if you had one more dose left. °DOES THIS MEDICINE AFFECT BIRTH CONTROL? °Birth control pills may not work while you are on antibiotic medicines. If you are taking birth control pills, keep taking them as usual. Use a second form of birth control, such as a condom. Keep using the second form of birth control until you are finished with your current 1 month cycle of birth control pills. °GET HELP IF: °· You get worse. °· You do not feel better a few days after starting the medicine. °· You throw up (vomit). °· There are white patches in your mouth. °· You have new  joint pain after starting the medicine. °· You have new muscle aches after starting the medicine. °· You had a fever before starting the medicine, and it comes back. °· You have any symptoms of an allergic reaction, such as an itchy rash. If this happens, stop taking the medicine. °GET HELP RIGHT AWAY IF: °· Your pee (urine) turns dark or becomes blood-colored. °· Your skin turns yellow. °· You bruise or bleed easily. °· You have very bad watery poop (diarrhea) and cramps in your belly (abdomen). °· You have a very bad headache. °· You have signs of a very bad allergic reaction, such as: °¨ Trouble breathing. °¨ Wheezing. °¨ Swelling of the lips, tongue, or face. °¨ Fainting. °¨ Blisters on the skin or in the mouth. °If you have signs of a very bad allergic reaction, stop taking the antibiotic medicine right away. °  °This information is not intended to replace advice given to you by your health care provider. Make sure you discuss any questions you have with your health care provider. °  °Document Released: 09/01/2008 Document Revised: 08/14/2015 Document Reviewed: 04/10/2015 °Elsevier Interactive Patient Education ©2016 [employer] ° °

## 2015-12-11 LAB — CULTURE, BLOOD (ROUTINE X 2)
CULTURE: NO GROWTH
Culture: NO GROWTH

## 2015-12-24 ENCOUNTER — Encounter (INDEPENDENT_AMBULATORY_CARE_PROVIDER_SITE_OTHER): Payer: Self-pay | Admitting: Internal Medicine

## 2015-12-24 ENCOUNTER — Telehealth (INDEPENDENT_AMBULATORY_CARE_PROVIDER_SITE_OTHER): Payer: Self-pay | Admitting: Internal Medicine

## 2015-12-24 ENCOUNTER — Ambulatory Visit (INDEPENDENT_AMBULATORY_CARE_PROVIDER_SITE_OTHER): Payer: Medicare Other | Admitting: Internal Medicine

## 2015-12-24 VITALS — BP 108/64 | HR 66 | Temp 98.0°F | Resp 16 | Ht 64.5 in | Wt 135.1 lb

## 2015-12-24 DIAGNOSIS — Z8639 Personal history of other endocrine, nutritional and metabolic disease: Secondary | ICD-10-CM

## 2015-12-24 DIAGNOSIS — K219 Gastro-esophageal reflux disease without esophagitis: Secondary | ICD-10-CM

## 2015-12-24 DIAGNOSIS — D509 Iron deficiency anemia, unspecified: Secondary | ICD-10-CM

## 2015-12-24 DIAGNOSIS — K50813 Crohn's disease of both small and large intestine with fistula: Secondary | ICD-10-CM | POA: Diagnosis not present

## 2015-12-24 DIAGNOSIS — R110 Nausea: Secondary | ICD-10-CM

## 2015-12-24 MED ORDER — NYSTATIN 100000 UNIT/GM EX CREA
1.0000 | TOPICAL_CREAM | Freq: Two times a day (BID) | CUTANEOUS | Status: DC
Start: 2015-12-24 — End: 2016-03-24

## 2015-12-24 MED ORDER — CIPROFLOXACIN HCL 500 MG PO TABS: 500.0000 mg | ORAL_TABLET | Freq: Two times a day (BID) | ORAL | Status: DC

## 2015-12-24 MED ORDER — ONDANSETRON HCL 4 MG PO TABS: 4.0000 mg | ORAL_TABLET | Freq: Two times a day (BID) | ORAL | Status: DC | PRN

## 2015-12-24 MED ORDER — METRONIDAZOLE 500 MG PO TABS: 500.0000 mg | ORAL_TABLET | Freq: Two times a day (BID) | ORAL | Status: DC

## 2015-12-24 MED ORDER — TRIAMCINOLONE ACETONIDE 0.1 % EX CREA: 1.0000 "application " | TOPICAL_CREAM | Freq: Two times a day (BID) | CUTANEOUS | Status: DC

## 2015-12-24 MED ORDER — NYSTATIN-TRIAMCINOLONE 100000-0.1 UNIT/GM-% EX CREA: 1.0000 "application " | TOPICAL_CREAM | Freq: Two times a day (BID) | CUTANEOUS | Status: DC

## 2015-12-24 NOTE — Telephone Encounter (Signed)
Mycolog-II cream is not covered. Wants triamcinolone and  nystatin prescription to be sent to her pharmacy

## 2015-12-24 NOTE — Progress Notes (Signed)
Presenting complaint:  Follow-up for Crohn's disease GERD and anemia.  Subjective:  Patient is 70 year old African-American female with multiple medical problems was here for scheduled visit. She was last seen on 09/16/2015. She was hospitalized at [facility] on 12/05/2015 for pneumonia and possible urosepsis but urine cultures were negative. She was discharged on 12/09/2015. Patient believes she had wire infection or food poisoning and had multiple episodes of vomiting prior to admission. She did not experience abdominal pain or diarrhea. She states heartburns well controlled with therapy and she denies postprandial or nocturnal regurgitation. She is having 3 bowel movements per day. Stool consistency varies between loose to formed stool. She denies melena or rectal bleeding. She has noted scant rectal discharge and feels that she needs to be back on antibiotics. She states she was seen by Dr. Leighton Ruff few months ago and she tried to aspirate perianal region to the left of anal orifice but there was no pus. It was felt that this changes due to scarring. She has gained 4 pounds since her last visit. She is getting pain medication from Dr. Berdine Addison which she uses primarily for back pain.    Current Medications: Outpatient Encounter Prescriptions as of 12/24/2015  Medication Sig  . acetaminophen (TYLENOL) 500 MG tablet Take 1,000 mg by mouth every 6 (six) hours as needed for mild pain or moderate pain.  [name] albuterol (PROVENTIL HFA;VENTOLIN HFA) 108 (90 Base) MCG/ACT inhaler Inhale 2 puffs into the lungs every 6 (six) hours as needed for wheezing.  . Calcium Carbonate-Vitamin D (CALCIUM + D PO) Take 1 tablet by mouth daily.  . Cyanocobalamin (VITAMIN B-12 IJ) Inject 1 each as directed every 30 (thirty) days. Around the 1st week of each month  . dicyclomine (BENTYL) 10 MG/5ML syrup TAKE 2 TEASPOONFUL BY MOUTH 3 TIMES A DAY BEFORE MEALS  . diphenhydrAMINE (BENADRYL) 25 mg capsule Take 25 mg by mouth every 6  (six) hours as needed for itching.  . diphenoxylate-atropine (LOMOTIL) 2.5-0.025 MG tablet Take 2 tablets by mouth 2 (two) times daily.  [name] HYDROcodone-acetaminophen (NORCO) 7.5-325 MG per tablet Take 1 tablet by mouth every 4 (four) hours as needed for moderate pain.   [name] inFLIXimab (REMICADE) 100 MG injection Inject 100 mg into the vein every 6 (six) weeks.  . magnesium oxide (MAG-OX) 400 (241.3 MG) MG tablet Take 1 tablet (400 mg total) by mouth daily.  . Multiple Vitamin (MULTIVITAMIN WITH MINERALS) TABS Take 1 tablet by mouth daily.  [name] nystatin (MYCOSTATIN/NYSTOP) 100000 UNIT/GM POWD USE TO AFFECTED AREA TWICE DAILY  . ondansetron (ZOFRAN) 4 MG tablet Take 1 tablet (4 mg total) by mouth every 8 (eight) hours as needed for nausea or vomiting.  . pantoprazole (PROTONIX) 40 MG tablet TAKE 1 TABLET (40 MG TOTAL) BY MOUTH 2 (TWO) TIMES DAILY BEFORE A MEAL.  [name] potassium chloride 20 MEQ/15ML (10%) SOLN Take 15 mLs (20 mEq total) by mouth 2 (two) times daily.  [name] spironolactone (ALDACTONE) 50 MG tablet Take 1 tablet (50 mg total) by mouth daily.  . [DISCONTINUED] clobetasol cream (TEMOVATE) 0.05 % APPLY TO AFFECTED AREA EVERY DAY  . ciprofloxacin (CIPRO) 500 MG tablet TAKE 1 TABLET (500 MG TOTAL) BY MOUTH 2 (TWO) TIMES DAILY. (Patient not taking: Reported on 12/24/2015)  . [DISCONTINUED] Wheat Dextrin (BENEFIBER DRINK MIX) PACK Take 4 g by mouth at bedtime. (Patient not taking: Reported on 12/05/2015)  . [DISCONTINUED] zolpidem (AMBIEN) 5 MG tablet Take 1 tablet (5 mg total) by mouth at bedtime as  needed for sleep. (Patient not taking: Reported on 12/24/2015)   No facility-administered encounter medications on file as of 12/24/2015.     Objective: Blood pressure 108/64, pulse 66, temperature 98 F (36.7 C), temperature source Oral, resp. rate 16, height 5' 4.5" (1.638 m), weight 135 lb 1.6 oz (61.281 kg). Patient is alert and in no acute distress. Conjunctiva is pink. Sclera is  nonicteric Oropharyngeal mucosa is normal. No neck masses or thyromegaly noted. Cardiac exam with regular rhythm normal S1 and S2. No murmur or gallop noted. Lungs are clear to auscultation. Abdomen is symmetrical soft and nontender without organomegaly or masses. Rectal examination revealed moist perianal skin with induration at 3 o'clock. Posteriorly there is tiny opening with mucopurulent material and there is thickening to skin but no fluctuant mass noted. No LE edema or clubbing noted.  Labs/studies Results: Lab data from 12/09/2015. WBC 12.1, H&H 8.8 and 28.6 and platelet count 269K   serum sodium 133, potassium 4.9, chloride 110, CO2 15, glucose 99, BUN 6 and creatinine 1.11  Assessment:  #1. Perianal Crohn's disease. She has small opening posterior to anal orifice but no fluctuant mass noted.. She needs to be back on antibiotics. This area will be examined prior to Remicade infusion next week. She may need to follow-up with Dr. Leighton Ruff again. #2. Anemia. H&H remains low. When it appears she is getting better she develops another acute illness causing setback. Anemia is multifactorial including due to iron deficiency. #3. History of hypokalemia and hypomagnesemia #4. GERD. Heartburn is well controlled with therapy.   Plan:  CBC, metabolic-7 and serum magnesium next week. Cipro 500 mg by mouth twice a day for 2 weeks. Metronidazole 250 mg by mouth 3 times a day for 2 weeks. Mycolog-II cream to be applied to perianal region twice a day for 2 weeks and then when necessary. Patient will undergo rectal exam prior to Remicade infusion next week.

## 2015-12-24 NOTE — Patient Instructions (Signed)
Blood work to be done at the time of Remicade infusion next week.

## 2015-12-25 ENCOUNTER — Other Ambulatory Visit (INDEPENDENT_AMBULATORY_CARE_PROVIDER_SITE_OTHER): Payer: Self-pay | Admitting: Internal Medicine

## 2016-01-01 ENCOUNTER — Encounter (HOSPITAL_COMMUNITY)
Admission: RE | Admit: 2016-01-01 | Discharge: 2016-01-01 | Disposition: A | Discharge status: Home / Self Care | Payer: Medicare Other | Source: Ambulatory Visit | Attending: Internal Medicine | Admitting: Internal Medicine

## 2016-01-01 DIAGNOSIS — K5090 Crohn's disease, unspecified, without complications: Secondary | ICD-10-CM | POA: Diagnosis not present

## 2016-01-01 LAB — MAGNESIUM: MAGNESIUM: 1.6 mg/dL — AB (ref 1.7–2.4)

## 2016-01-01 LAB — COMPREHENSIVE METABOLIC PANEL
ALT: 10 U/L — ABNORMAL LOW (ref 14–54)
ANION GAP: 7 (ref 5–15)
AST: 19 U/L (ref 15–41)
Albumin: 3.1 g/dL — ABNORMAL LOW (ref 3.5–5.0)
Alkaline Phosphatase: 118 U/L (ref 38–126)
BILIRUBIN TOTAL: 0.3 mg/dL (ref 0.3–1.2)
BUN: 10 mg/dL (ref 6–20)
CHLORIDE: 114 mmol/L — AB (ref 101–111)
CO2: 16 mmol/L — ABNORMAL LOW (ref 22–32)
Calcium: 8.7 mg/dL — ABNORMAL LOW (ref 8.9–10.3)
Creatinine, Ser: 1.58 mg/dL — ABNORMAL HIGH (ref 0.44–1.00)
GFR calc Af Amer: 38 mL/min — ABNORMAL LOW (ref >60)
GFR, EST NON AFRICAN AMERICAN: 33 mL/min — AB (ref >60)
Glucose, Bld: 70 mg/dL (ref 65–99)
POTASSIUM: 3.4 mmol/L — AB (ref 3.5–5.1)
Sodium: 137 mmol/L (ref 135–145)
TOTAL PROTEIN: 7.6 g/dL (ref 6.5–8.1)

## 2016-01-01 LAB — CBC
HEMATOCRIT: 29.1 % — AB (ref 36.0–46.0)
Hemoglobin: 8.9 g/dL — ABNORMAL LOW (ref 12.0–15.0)
MCH: 24.7 pg — ABNORMAL LOW (ref 26.0–34.0)
MCHC: 30.6 g/dL (ref 30.0–36.0)
MCV: 80.8 fL (ref 78.0–100.0)
PLATELETS: 406 10*3/uL — AB (ref 150–400)
RBC: 3.6 MIL/uL — ABNORMAL LOW (ref 3.87–5.11)
RDW: 15.5 % (ref 11.5–15.5)
WBC: 11.3 10*3/uL — AB (ref 4.0–10.5)

## 2016-01-01 MED ORDER — ACETAMINOPHEN 325 MG PO TABS
650.0000 mg | ORAL_TABLET | Freq: Once | ORAL | Status: AC
  Administered 2016-01-01: 650 mg via ORAL
  Filled 2016-01-01: qty 2

## 2016-01-01 MED ORDER — LORATADINE 10 MG PO TABS
10.0000 mg | ORAL_TABLET | Freq: Once | ORAL | Status: AC
  Administered 2016-01-01: 10 mg via ORAL
  Filled 2016-01-01: qty 1

## 2016-01-01 MED ORDER — SODIUM CHLORIDE 0.9 % IV SOLN
5.0000 mg/kg | INTRAVENOUS | Status: DC
  Administered 2016-01-01: 300 mg via INTRAVENOUS
  Filled 2016-01-01: qty 30

## 2016-01-01 MED ORDER — SODIUM CHLORIDE 0.9 % IV SOLN
INTRAVENOUS | Status: DC
  Administered 2016-01-01: 250 mL via INTRAVENOUS

## 2016-01-01 NOTE — Progress Notes (Signed)
Results for Danielle [name], Danielle [name] (MRN [id number]) as of 01/01/2016 15:00  Ref. Range 01/01/2016 10:00  Sodium Latest Ref Range: 135-145 mmol/L 137  Potassium Latest Ref Range: 3.5-5.1 mmol/L 3.4 (L)  Chloride Latest Ref Range: 101-111 mmol/L 114 (H)  CO2 Latest Ref Range: 22-32 mmol/L 16 (L)  BUN Latest Ref Range: 6-20 mg/dL 10  Creatinine Latest Ref Range: 0.44-1.00 mg/dL 1.58 (H)  Calcium Latest Ref Range: 8.9-10.3 mg/dL 8.7 (L)  EGFR (Non-African Amer.) Latest Ref Range: >60 mL/min 33 (L)  EGFR (African American) Latest Ref Range: >60 mL/min 38 (L)  Glucose Latest Ref Range: 65-99 mg/dL 70  Anion gap Latest Ref Range: 5-15  7  Magnesium Latest Ref Range: 1.7-2.4 mg/dL 1.6 (L)  Alkaline Phosphatase Latest Ref Range: 38-126 U/L 118  Albumin Latest Ref Range: 3.5-5.0 g/dL 3.1 (L)  AST Latest Ref Range: 15-41 U/L 19  ALT Latest Ref Range: 14-54 U/L 10 (L)  Total Protein Latest Ref Range: 6.5-8.1 g/dL 7.6  Total Bilirubin Latest Ref Range: 0.3-1.2 mg/dL 0.3  WBC Latest Ref Range: 4.0-10.5 K/uL 11.3 (H)  RBC Latest Ref Range: 3.87-5.11 MIL/uL 3.60 (L)  Hemoglobin Latest Ref Range: 12.0-15.0 g/dL 8.9 (L)  HCT Latest Ref Range: 36.0-46.0 % 29.1 (L)  MCV Latest Ref Range: 78.0-100.0 fL 80.8  MCH Latest Ref Range: 26.0-34.0 pg 24.7 (L)  MCHC Latest Ref Range: 30.0-36.0 g/dL 30.6  RDW Latest Ref Range: 11.5-15.5 % 15.5  Platelets Latest Ref Range: 150-400 K/uL 406 (H)

## 2016-01-03 ENCOUNTER — Encounter (INDEPENDENT_AMBULATORY_CARE_PROVIDER_SITE_OTHER): Payer: Self-pay

## 2016-01-07 ENCOUNTER — Telehealth (INDEPENDENT_AMBULATORY_CARE_PROVIDER_SITE_OTHER): Payer: Self-pay | Admitting: Internal Medicine

## 2016-01-07 DIAGNOSIS — K50918 Crohn's disease, unspecified, with other complication: Secondary | ICD-10-CM

## 2016-01-07 MED ORDER — SODIUM BICARBONATE 650 MG PO TABS: 650.0000 mg | ORAL_TABLET | Freq: Two times a day (BID) | ORAL | Status: DC

## 2016-01-08 DIAGNOSIS — D510 Vitamin B12 deficiency anemia due to intrinsic factor deficiency: Secondary | ICD-10-CM | POA: Diagnosis not present

## 2016-01-08 DIAGNOSIS — Z111 Encounter for screening for respiratory tuberculosis: Secondary | ICD-10-CM | POA: Diagnosis not present

## 2016-01-08 NOTE — Telephone Encounter (Signed)
Per Dr.Rehman the patient will need to have labs drawn in 1 month. 

## 2016-01-09 ENCOUNTER — Other Ambulatory Visit (INDEPENDENT_AMBULATORY_CARE_PROVIDER_SITE_OTHER): Payer: Self-pay | Admitting: Internal Medicine

## 2016-01-14 ENCOUNTER — Telehealth (INDEPENDENT_AMBULATORY_CARE_PROVIDER_SITE_OTHER): Payer: Self-pay | Admitting: Internal Medicine

## 2016-01-14 ENCOUNTER — Other Ambulatory Visit (INDEPENDENT_AMBULATORY_CARE_PROVIDER_SITE_OTHER): Payer: Self-pay | Admitting: Internal Medicine

## 2016-01-14 NOTE — Telephone Encounter (Signed)
Danielle [name] called saying she's completely out of Zofran and needs a refill sent to her pharmacy.    Pt's ph# [phone] Thank you.

## 2016-01-15 ENCOUNTER — Other Ambulatory Visit (INDEPENDENT_AMBULATORY_CARE_PROVIDER_SITE_OTHER): Payer: Self-pay | Admitting: *Deleted

## 2016-01-15 DIAGNOSIS — K5090 Crohn's disease, unspecified, without complications: Secondary | ICD-10-CM | POA: Diagnosis not present

## 2016-01-15 DIAGNOSIS — D510 Vitamin B12 deficiency anemia due to intrinsic factor deficiency: Secondary | ICD-10-CM | POA: Diagnosis not present

## 2016-01-15 MED ORDER — ONDANSETRON HCL 4 MG PO TABS: ORAL_TABLET | ORAL | Status: DC

## 2016-01-15 NOTE — Telephone Encounter (Signed)
Patient was called and states that she is in need of a refill on Zofran. Forwarded to Terri to address.

## 2016-01-15 NOTE — Telephone Encounter (Signed)
A refill request has been sent to [facility]

## 2016-01-22 IMAGING — MR MR PELVIS W/O CM
6 of 8 series · 20 of 48 positions shown · IV contrast (agent unspecified)
Comparison: CT 07/12/2013

CLINICAL DATA: Crohn's disease. No new complaints. Follow-up exam.

EXAM:
MRI ABDOMEN AND PELVIS WITHOUT  CONTRAST
TECHNIQUE: Multiplanar multisequence MR imaging of the abdomen and pelvis was
performed.
CONTRAST:  No IV contrast

[Series 4: T2 · axial · 5.0mm · 0.66mm/px · z∈[-144,+196]mm · 5 of 69 slices shown (1 of 2)]
[im 1/69]
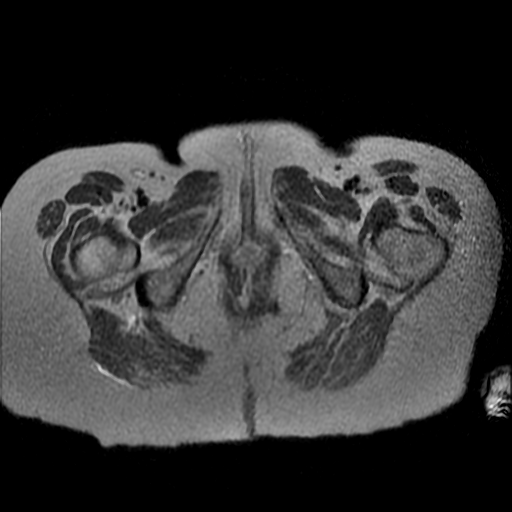
[im 18/69]
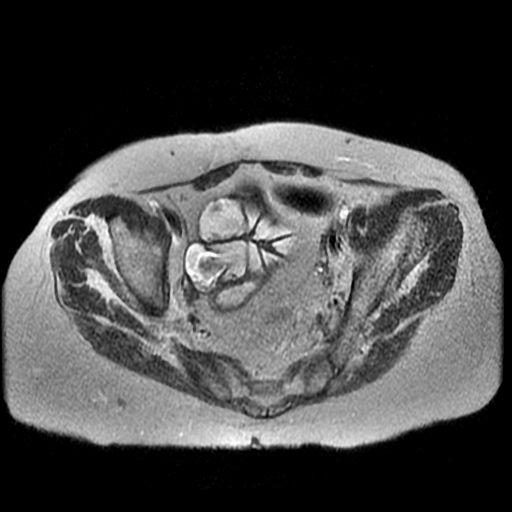
[im 35/69]
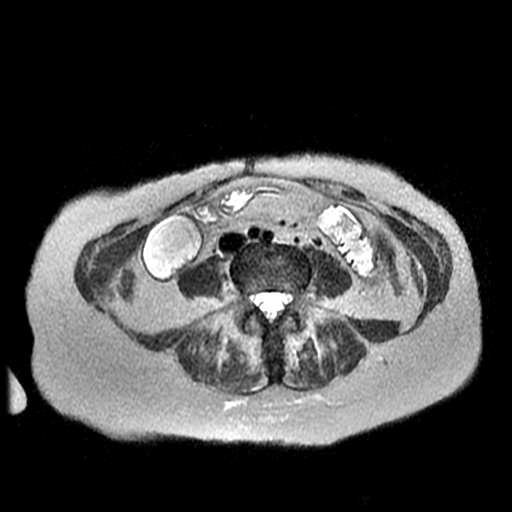
[im 52/69]
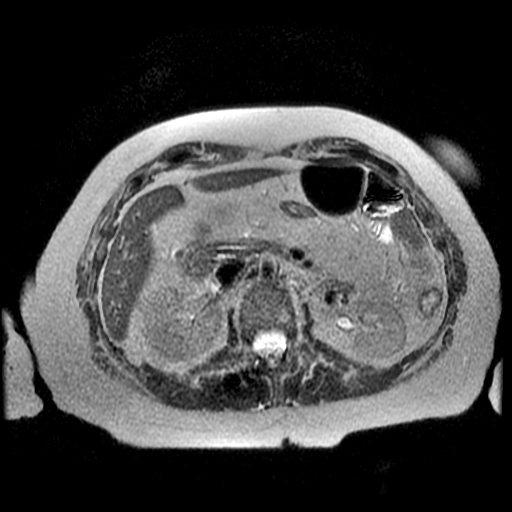
[im 69/69]
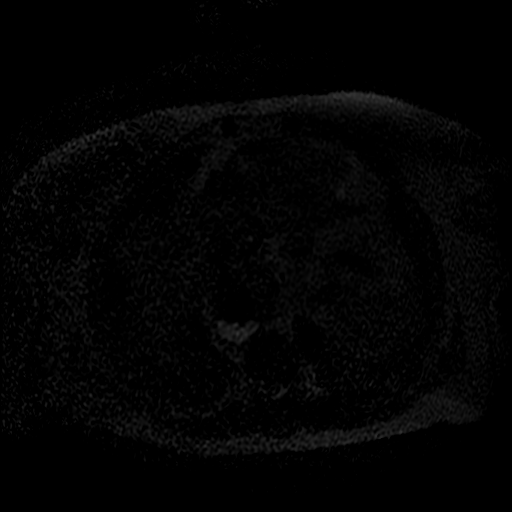

[Series 5: T2 · coronal · 5.0mm · 0.78mm/px · 1 of 24 slices shown (2 of 2)]
[im 1/24]
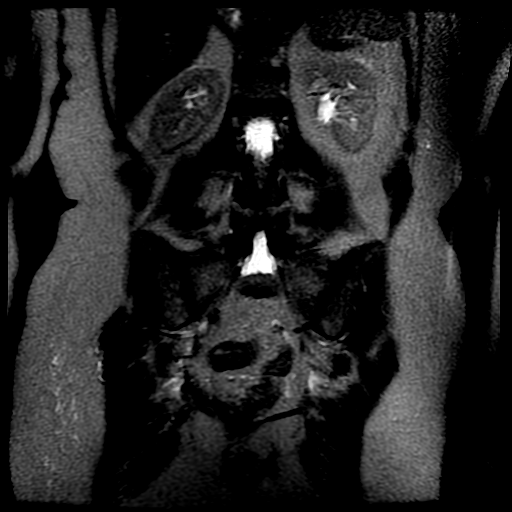

[Series 6: bSSFP · coronal · 5.0mm · 0.78mm/px · 9 of 460 slices shown (1 of 2)]
[im 19/460]
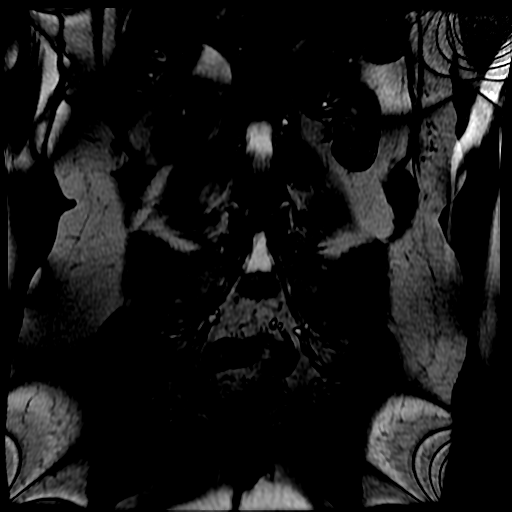
[im 74/460]
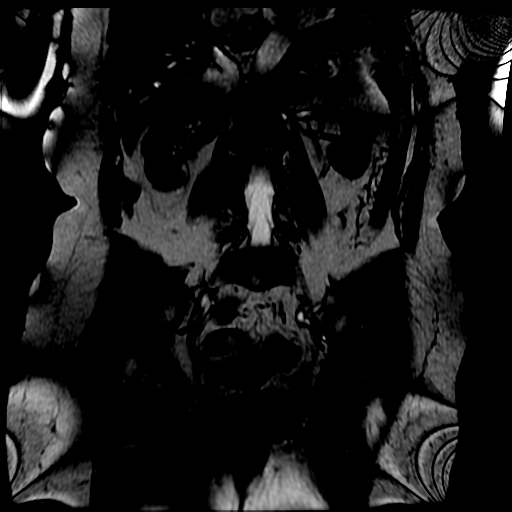
[im 147/460]
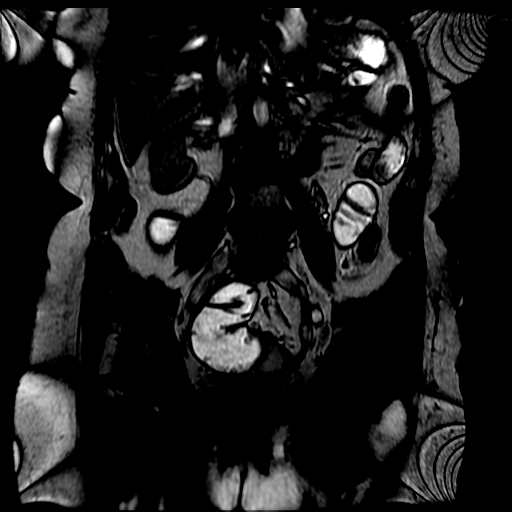
[im 202/460]
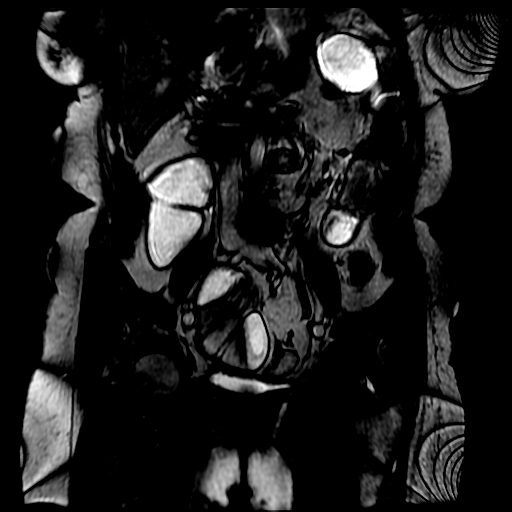
[im 239/460]
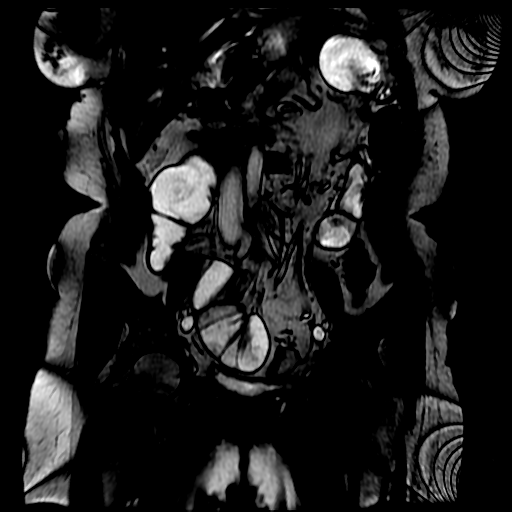
[im 258/460]
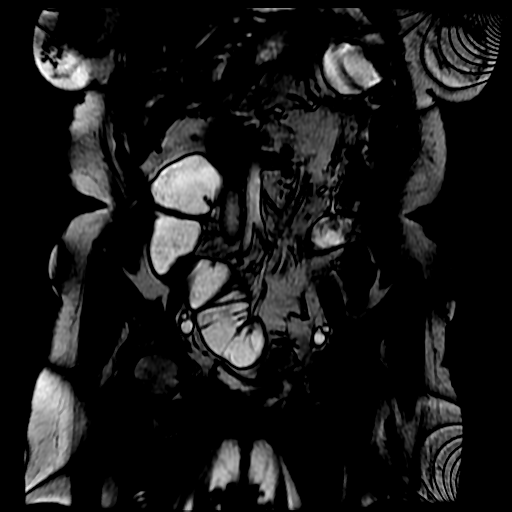
[im 313/460]
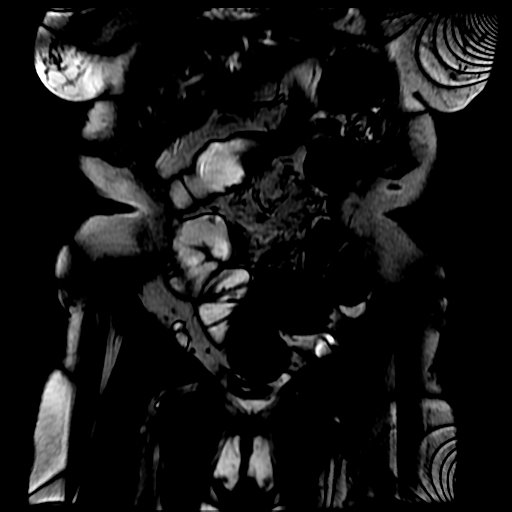
[im 386/460]
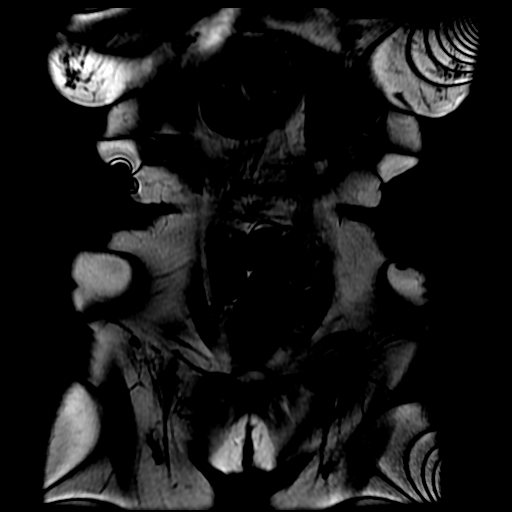
[im 441/460]
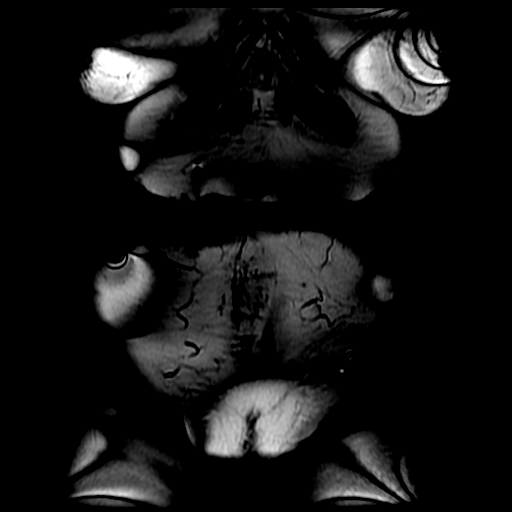

[Series 7: DWI b500 · axial · 6.0mm · 1.41mm/px · z∈[-10,+216]mm · 3 of 60 slices shown]
[im 1/60]
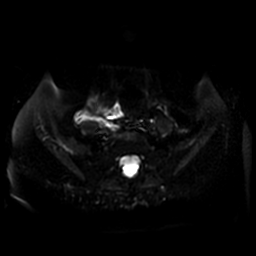
[im 30/60]
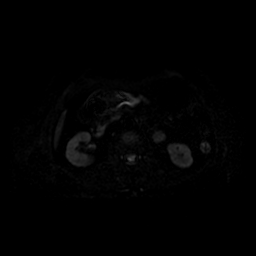
[im 60/60]
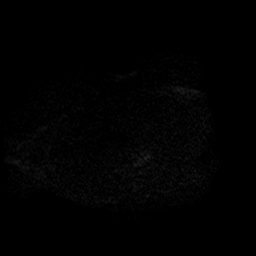

[Series 8: bSSFP · coronal · 5.0mm · 0.72mm/px · 1 of 23 slices shown (2 of 2)]
[im 1/23]
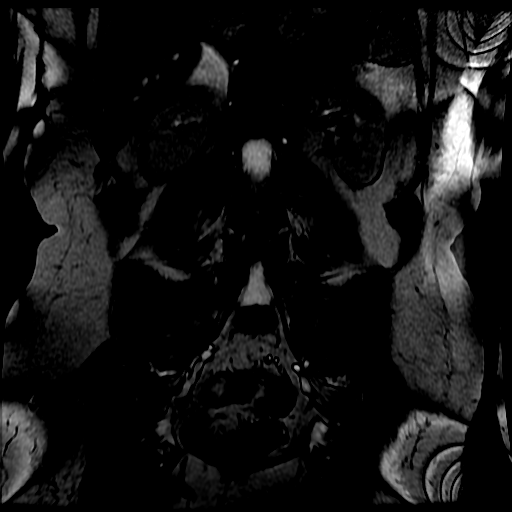

[Series 9: T1 dynamic · axial · 4.0mm · 0.74mm/px · 1 of 88 slices shown]
[im 1/88]
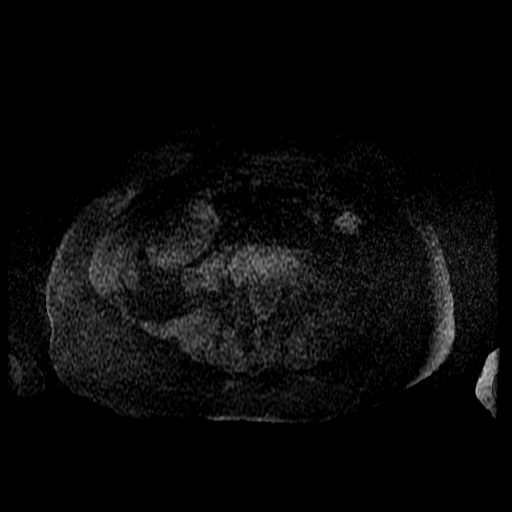

[20 of 48 positions shown; findings below may reference images not displayed]

FINDINGS: MRI ABDOMEN FINDINGS

Lower chest:  Poorly imaged

Hepatobiliary: No focal hepatic lesion. No biliary duct dilatation.
Post cholecystectomy. The common hepatic duct and common bile duct
are mildly dilated following cholecystectomy with the common bile
duct measuring 8 mm

Pancreas: Pancreatic parenchyma appears normal without of duct
dilatation or inflammation.

Spleen: Normal spleen.

Adrenals/urinary tract: Adrenal glands and kidneys are normal. No
renal obstruction. The bladder appears normal.

Stomach/Bowel: Dynamic imaging of the bowel was performed. There is
evidence of multiple surgeries with small bowel resection and
enteric colonic anastomoses. There is no evidence of bowel wall
thickening or peri colonic or perienteric mesenteric inflammation to
suggest active Crohn's disease. No clear evidence of stricturing. No
evidence of fistula or abscess.

Vascular/Lymphatic: All Abdominal aorta is normal caliber. There is
no retroperitoneal or periportal lymphadenopathy. No pelvic
lymphadenopathy.

Musculoskeletal:  Tarlov cyst in the sacrum

MRI PELVIS FINDINGS

The uterus and ovaries are normal.  No free fluid the pelvis.

Rectum appears normal. There is a tubular structure extending from
the posterior aspect of the mesorectal fascia and extending
anteriorly to involve the posterior wall of the distal rectum/anus.
There is a fluid collection extending from 10 o'clock to 4 o'clock
surrounding the anus. There is minimal inflammation associated with
fluid collection. These findings are similar to CT of 07/12/2013
IMPRESSION: 1. No evidence of active Crohn's disease within the small bowel or
colon.
2. No evidence of abscess or fistula involving the small bowel or
colon.
3. No evidence of high-grade stricturing.
4. Multiple resections and anastomoses difficult to evaluate on MRI.
5. Chronic trans - sphincteric perianal fistula extending posterior
from the anus to the mesorectal fascia. This appears to be a chronic
finding with minimal inflammation and no significant change from CT
of 07/12/2013.

## 2016-02-03 ENCOUNTER — Encounter (INDEPENDENT_AMBULATORY_CARE_PROVIDER_SITE_OTHER): Payer: Self-pay | Admitting: *Deleted

## 2016-02-03 ENCOUNTER — Other Ambulatory Visit (INDEPENDENT_AMBULATORY_CARE_PROVIDER_SITE_OTHER): Payer: Self-pay | Admitting: Internal Medicine

## 2016-02-03 ENCOUNTER — Other Ambulatory Visit (INDEPENDENT_AMBULATORY_CARE_PROVIDER_SITE_OTHER): Payer: Self-pay | Admitting: *Deleted

## 2016-02-03 DIAGNOSIS — N823 Fistula of vagina to large intestine: Secondary | ICD-10-CM | POA: Diagnosis not present

## 2016-02-03 DIAGNOSIS — K50918 Crohn's disease, unspecified, with other complication: Secondary | ICD-10-CM

## 2016-02-03 DIAGNOSIS — Z8639 Personal history of other endocrine, nutritional and metabolic disease: Secondary | ICD-10-CM

## 2016-02-03 DIAGNOSIS — K50919 Crohn's disease, unspecified, with unspecified complications: Secondary | ICD-10-CM

## 2016-02-03 DIAGNOSIS — K50813 Crohn's disease of both small and large intestine with fistula: Secondary | ICD-10-CM

## 2016-02-03 MED ORDER — CIPROFLOXACIN HCL 500 MG PO TABS: 500.0000 mg | ORAL_TABLET | Freq: Two times a day (BID) | ORAL | Status: DC

## 2016-02-03 NOTE — Telephone Encounter (Signed)
Patient called and states that she needs this refill also.  Sores are back.

## 2016-02-09 IMAGING — CR DG ABDOMEN 2V
2 series · 2 of 2 positions shown · non-contrast
Comparison: 08/22/2013, 07/12/2013

CLINICAL DATA: Initial evaluation for vomiting and upper abdominal
pain for 1 week with mild diarrhea, personal history of Crohn's
disease, GERD, and hiatal hernia

EXAM:
ABDOMEN - 2 VIEW

[w abdomen upright]
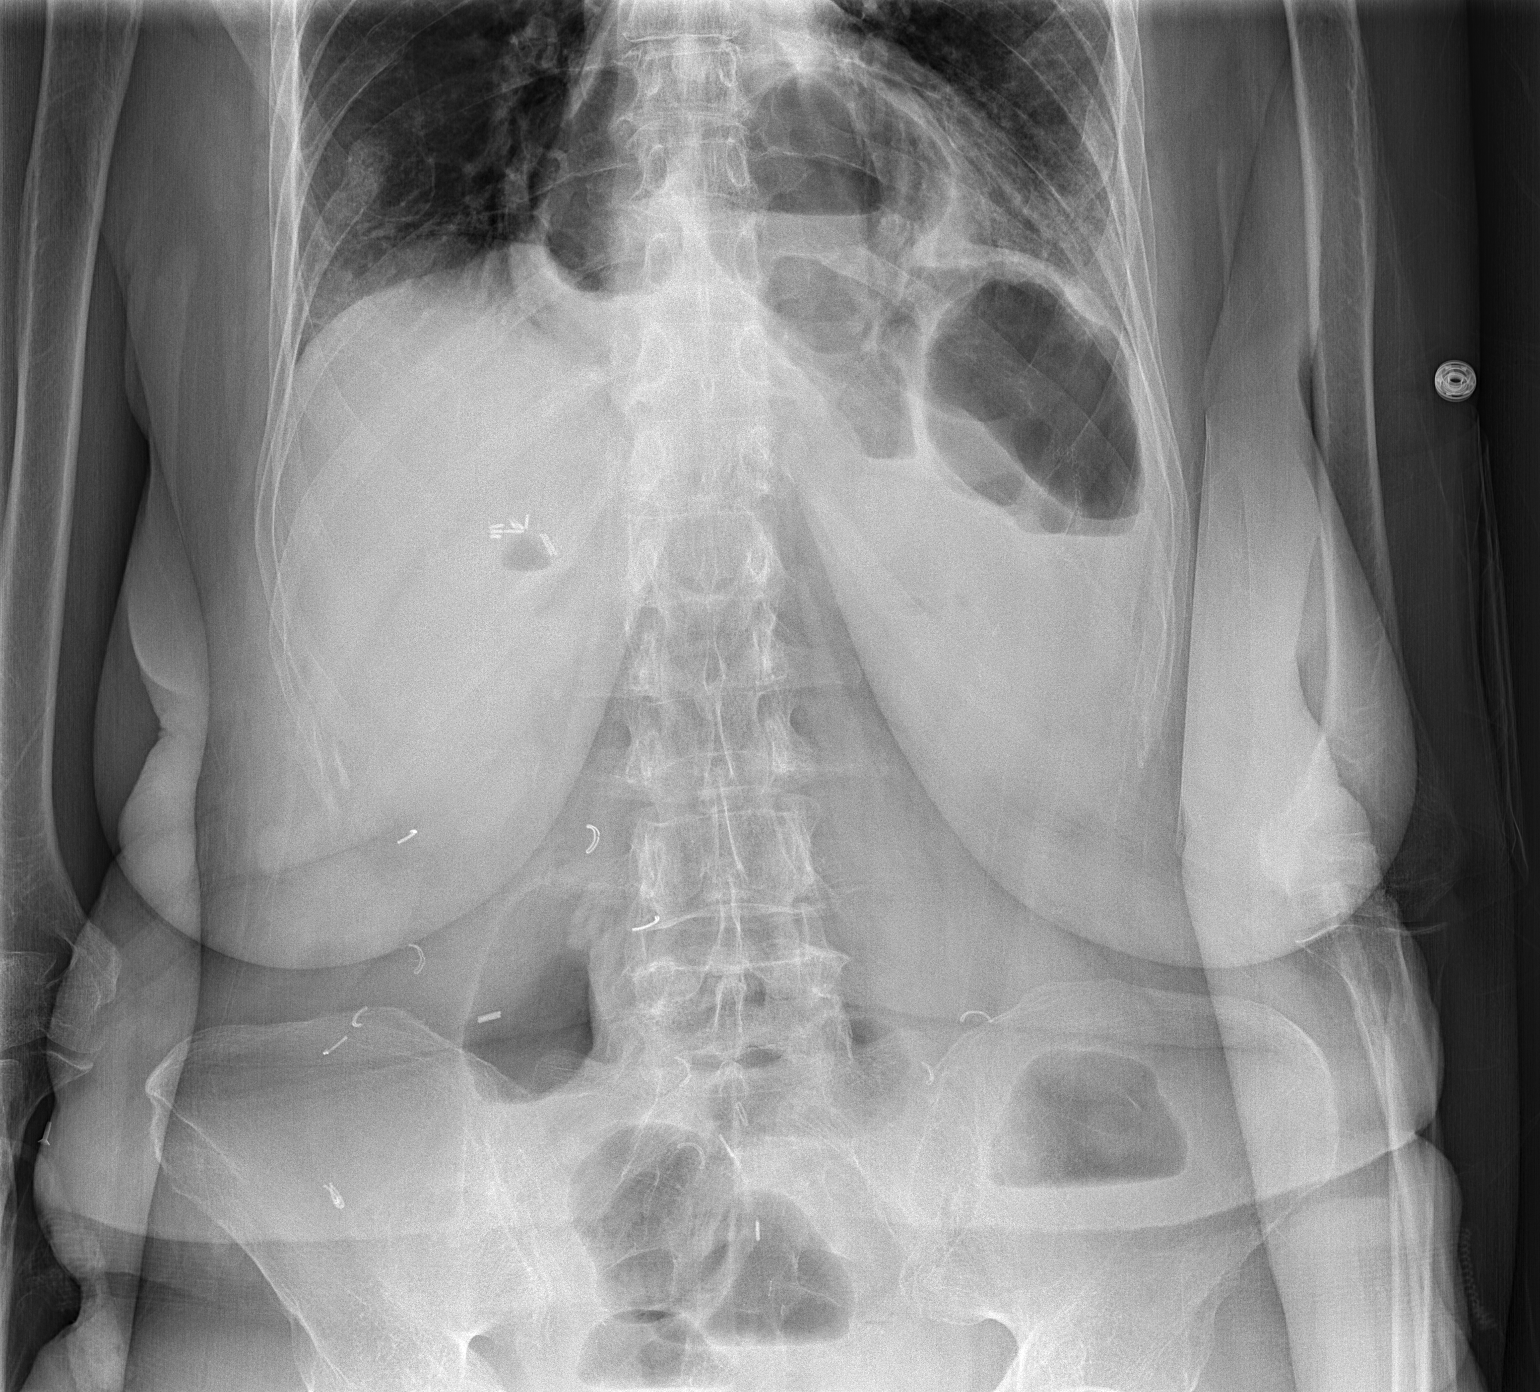

[t abdomen supine]
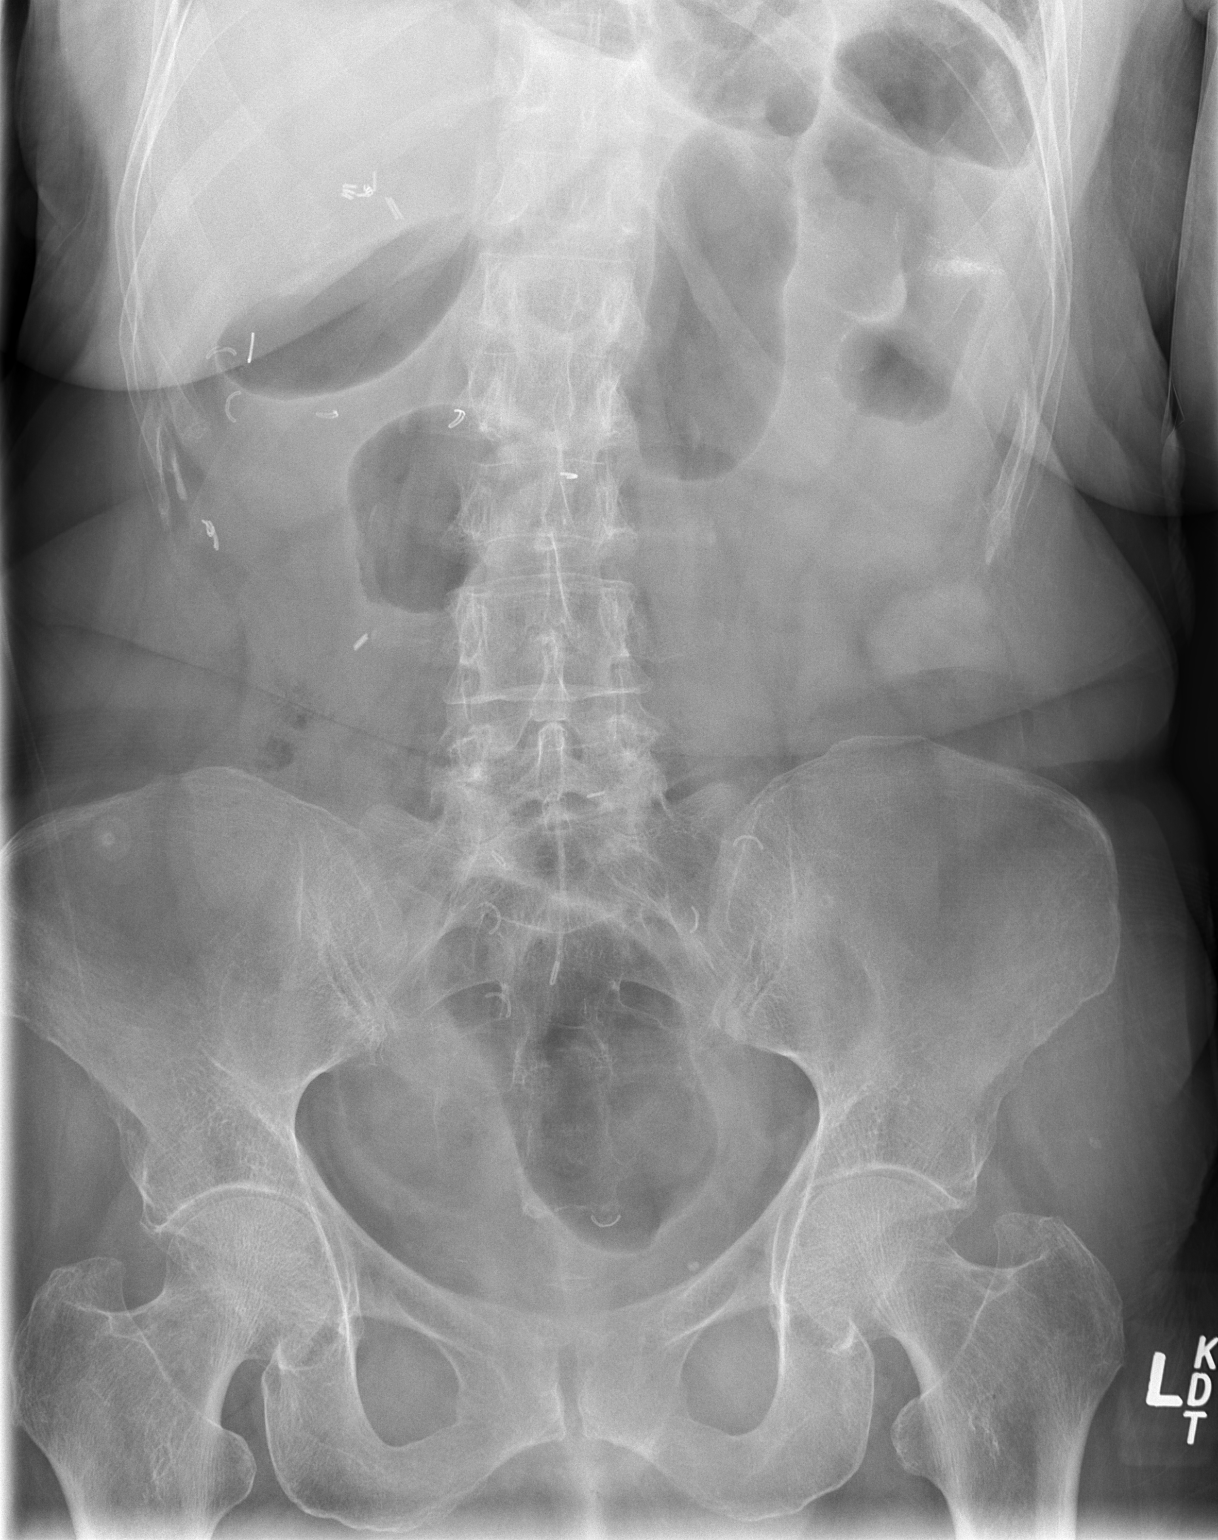

[2 of 2 positions shown; findings below may reference images not displayed]

FINDINGS: Large hiatal hernia with air-fluid levels within it. Left upper
quadrant air-fluid levels possibly within stomach or small bowel.
Single small right upper quadrant air-fluid level. Air-fluid levels
in the mid abdomen inferiorly. No free air in the abdomen
identified. Numerous surgical staples identified in the upper and
lower abdomen consistent with prior bowel surgery.

When compared to 08/22/2013, the bowel gas pattern is similar.
Correlating the appearance with CT scan performed 07/12/2013, the
pattern is consistent with known hiatal hernia as well as with the
presence of areas of relative small bowel stricture ring and
dilatation as seen on prior CT scan.
IMPRESSION: Known large hiatal hernia. Nonspecific air-fluid levels in small and
large bowel, similar to prior studies, consistent with known
alternating areas of stricturing and dilatation.

## 2016-02-10 DIAGNOSIS — B5801 Toxoplasma chorioretinitis: Secondary | ICD-10-CM | POA: Diagnosis not present

## 2016-02-10 DIAGNOSIS — H25812 Combined forms of age-related cataract, left eye: Secondary | ICD-10-CM | POA: Diagnosis not present

## 2016-02-10 DIAGNOSIS — H25811 Combined forms of age-related cataract, right eye: Secondary | ICD-10-CM | POA: Diagnosis not present

## 2016-02-10 DIAGNOSIS — H353131 Nonexudative age-related macular degeneration, bilateral, early dry stage: Secondary | ICD-10-CM | POA: Diagnosis not present

## 2016-02-12 ENCOUNTER — Encounter (HOSPITAL_COMMUNITY)
Admission: RE | Admit: 2016-02-12 | Discharge: 2016-02-12 | Disposition: A | Discharge status: Home / Self Care | Payer: Medicare Other | Source: Ambulatory Visit | Attending: Internal Medicine | Admitting: Internal Medicine

## 2016-02-12 DIAGNOSIS — K5090 Crohn's disease, unspecified, without complications: Secondary | ICD-10-CM | POA: Insufficient documentation

## 2016-02-12 MED ORDER — SODIUM CHLORIDE 0.9 % IV SOLN
300.0000 mg | INTRAVENOUS | Status: DC
  Administered 2016-02-12: 300 mg via INTRAVENOUS
  Filled 2016-02-12: qty 30

## 2016-02-12 MED ORDER — LORATADINE 10 MG PO TABS
10.0000 mg | ORAL_TABLET | Freq: Once | ORAL | Status: AC
  Administered 2016-02-12: 10 mg via ORAL
  Filled 2016-02-12: qty 1

## 2016-02-12 MED ORDER — ACETAMINOPHEN 325 MG PO TABS
650.0000 mg | ORAL_TABLET | Freq: Once | ORAL | Status: AC
  Administered 2016-02-12: 650 mg via ORAL
  Filled 2016-02-12: qty 2

## 2016-02-12 MED ORDER — SODIUM CHLORIDE 0.9 % IV SOLN
INTRAVENOUS | Status: DC
  Administered 2016-02-12: 200 mL via INTRAVENOUS

## 2016-02-17 DIAGNOSIS — H25812 Combined forms of age-related cataract, left eye: Secondary | ICD-10-CM | POA: Diagnosis not present

## 2016-02-17 DIAGNOSIS — H2512 Age-related nuclear cataract, left eye: Secondary | ICD-10-CM | POA: Diagnosis not present

## 2016-02-19 ENCOUNTER — Other Ambulatory Visit (INDEPENDENT_AMBULATORY_CARE_PROVIDER_SITE_OTHER): Payer: Self-pay | Admitting: Internal Medicine

## 2016-03-03 ENCOUNTER — Telehealth (INDEPENDENT_AMBULATORY_CARE_PROVIDER_SITE_OTHER): Payer: Self-pay | Admitting: Internal Medicine

## 2016-03-03 NOTE — Telephone Encounter (Signed)
Ms. Voils called saying she wants [name] to have Dr. Laural Golden call her TODAY due to having a question.  Pt's ph# [phone] Thank you.

## 2016-03-04 NOTE — Telephone Encounter (Signed)
Patient was called. She states that she is feeling fine , nothing hurts. She is just concerned that she has lost 7 lbs in a week. Recently has cataract surgery on eye and did well with that.  She is asking about lab work being drawn to see what may be causing her to lose weight. [phone].  To be discussed with Dr.Rehman.

## 2016-03-05 ENCOUNTER — Other Ambulatory Visit (INDEPENDENT_AMBULATORY_CARE_PROVIDER_SITE_OTHER): Payer: Self-pay | Admitting: Internal Medicine

## 2016-03-06 NOTE — Telephone Encounter (Signed)
Per Dr.Rehman the patient has an appointment soon. This will be addressed at that time. Patient was called and made aware.

## 2016-03-11 ENCOUNTER — Other Ambulatory Visit (INDEPENDENT_AMBULATORY_CARE_PROVIDER_SITE_OTHER): Payer: Self-pay | Admitting: Internal Medicine

## 2016-03-11 NOTE — Telephone Encounter (Signed)
duplicate

## 2016-03-12 DIAGNOSIS — K50114 Crohn's disease of large intestine with abscess: Secondary | ICD-10-CM | POA: Diagnosis not present

## 2016-03-13 ENCOUNTER — Telehealth (INDEPENDENT_AMBULATORY_CARE_PROVIDER_SITE_OTHER): Payer: Self-pay | Admitting: Internal Medicine

## 2016-03-13 MED ORDER — ONDANSETRON HCL 4 MG PO TABS: 4.0000 mg | ORAL_TABLET | ORAL | Status: DC | PRN

## 2016-03-13 NOTE — Telephone Encounter (Signed)
Refill on Zofran ordered

## 2016-03-23 DIAGNOSIS — K50114 Crohn's disease of large intestine with abscess: Secondary | ICD-10-CM | POA: Diagnosis not present

## 2016-03-24 ENCOUNTER — Encounter (INDEPENDENT_AMBULATORY_CARE_PROVIDER_SITE_OTHER): Payer: Self-pay | Admitting: Internal Medicine

## 2016-03-24 ENCOUNTER — Telehealth (INDEPENDENT_AMBULATORY_CARE_PROVIDER_SITE_OTHER): Payer: Self-pay | Admitting: *Deleted

## 2016-03-24 ENCOUNTER — Ambulatory Visit (INDEPENDENT_AMBULATORY_CARE_PROVIDER_SITE_OTHER): Payer: Medicare Other | Admitting: Internal Medicine

## 2016-03-24 VITALS — BP 102/66 | HR 86 | Temp 98.6°F | Resp 18 | Ht 64.5 in | Wt 126.5 lb

## 2016-03-24 DIAGNOSIS — K50918 Crohn's disease, unspecified, with other complication: Secondary | ICD-10-CM

## 2016-03-24 DIAGNOSIS — E876 Hypokalemia: Secondary | ICD-10-CM

## 2016-03-24 DIAGNOSIS — K219 Gastro-esophageal reflux disease without esophagitis: Secondary | ICD-10-CM | POA: Diagnosis not present

## 2016-03-24 DIAGNOSIS — R634 Abnormal weight loss: Secondary | ICD-10-CM

## 2016-03-24 DIAGNOSIS — D6489 Other specified anemias: Secondary | ICD-10-CM | POA: Diagnosis not present

## 2016-03-24 DIAGNOSIS — R197 Diarrhea, unspecified: Secondary | ICD-10-CM

## 2016-03-24 DIAGNOSIS — R21 Rash and other nonspecific skin eruption: Secondary | ICD-10-CM

## 2016-03-24 MED ORDER — MEGESTROL ACETATE 40 MG PO TABS: 200.0000 mg | ORAL_TABLET | Freq: Every day | ORAL | Status: DC

## 2016-03-24 MED ORDER — TRIAMCINOLONE ACETONIDE 0.1 % EX CREA: 1.0000 "application " | TOPICAL_CREAM | Freq: Two times a day (BID) | CUTANEOUS | Status: DC | PRN

## 2016-03-24 MED ORDER — NYSTATIN 100000 UNIT/GM EX CREA
1.0000 | TOPICAL_CREAM | Freq: Two times a day (BID) | CUTANEOUS | Status: DC | PRN
Start: 2016-03-24 — End: 2016-11-16

## 2016-03-24 NOTE — Telephone Encounter (Signed)
Pharmacy called and per Dr.Rehman Change the Megace to liquid. Patient will take 5 mLs daily. Patient was called and made aware.

## 2016-03-24 NOTE — Patient Instructions (Signed)
Physician will call with results of blood tests when completed. Continue pantoprazole 40 mg by mouth before breakfast daily and you second dose on as needed basis.

## 2016-03-24 NOTE — Telephone Encounter (Signed)
Danielle [name] needs the medication Dr. Laural Golden just sent in today to be liquid form and not tablet.  Please call her when corrected--needs today

## 2016-03-24 NOTE — Progress Notes (Signed)
Presenting complaint;  Follow-up for Crohn's disease GERD anemia and electrolyte abnormalities.  Subjective:  Patient is 70 year old African-American female who is here for scheduled visit accompanied by her niece who is also an Therapist, sports. She was last seen on 12/24/2015. She was advised have blood work and Fabry but she forgot to do so. She had cataract surgery [place] over a month ago and since then she hasn't had a good appetite. She has lost 9 pounds since then. She used to weigh 150 pounds 4 years ago. She says her vision is not 100% but she believes it will get better. She feels heartburns well controlled with therapy. At times she misses a second dose and cannot tell a difference. She denies dysphagia. She is having an average of 3 stools per day and all of her stools are soft. She denies melena or rectal bleeding or abdominal pain. She has not followed up with her nephrologist as recommended. She denies fever or night sweats. She would like to be treated with Megace which has helped her in the past. She is scheduled to have Remicade infusion tomorrow. She was seen by Dr. Leighton Ruff yesterday and was told that perianal abscess at resolved and she was advised to finish up prescription of Cipro and metronidazole. Patient states she will need new prescription for Mycostatin and triamcinolone cream that that she uses for perianal discomfort/rash.   Current Medications: Outpatient Encounter Prescriptions as of 03/24/2016  Medication Sig  . acetaminophen (TYLENOL) 500 MG tablet Take 1,000 mg by mouth every 6 (six) hours as needed for mild pain or moderate pain.  [name] albuterol (PROVENTIL HFA;VENTOLIN HFA) 108 (90 Base) MCG/ACT inhaler Inhale 2 puffs into the lungs every 6 (six) hours as needed for wheezing.  . Calcium Carbonate-Vitamin D (CALCIUM + D PO) Take 1 tablet by mouth daily.  . ciprofloxacin (CIPRO) 500 MG tablet Take 1 tablet (500 mg total) by mouth 2 (two) times daily.  . Cyanocobalamin  (VITAMIN B-12 IJ) Inject 1 each as directed every 30 (thirty) days. Around the 1st week of each month  . dicyclomine (BENTYL) 10 MG/5ML syrup TAKE 2 TEASPOONFULS BY MOUTH 3 TIMES A DAY BEFORE MEALS  . diphenhydrAMINE (BENADRYL) 25 mg capsule Take 25 mg by mouth every 6 (six) hours as needed for itching.  . diphenoxylate-atropine (LOMOTIL) 2.5-0.025 MG tablet TAKE 2 TABLETS BY MOUTH TWICE A DAY  . HYDROcodone-acetaminophen (NORCO) 7.5-325 MG per tablet Take 1 tablet by mouth every 4 (four) hours as needed for moderate pain.   [name] inFLIXimab (REMICADE) 100 MG injection Inject 100 mg into the vein every 6 (six) weeks.  . magnesium oxide (MAG-OX) 400 (241.3 MG) MG tablet Take 1 tablet (400 mg total) by mouth daily.  . metroNIDAZOLE (FLAGYL) 500 MG tablet TAKE 1 TABLET (500 MG TOTAL) BY MOUTH 2 (TWO) TIMES DAILY.  . Multiple Vitamin (MULTIVITAMIN WITH MINERALS) TABS Take 1 tablet by mouth daily.  [name] nystatin cream (MYCOSTATIN) Apply 1 application topically 2 (two) times daily.  [name] nystatin-triamcinolone (MYCOLOG II) cream Apply 1 application topically 2 (two) times daily.  . ondansetron (ZOFRAN) 4 MG tablet Take 1 tablet (4 mg total) by mouth as needed for nausea or vomiting. Must 30 days before next refill  . pantoprazole (PROTONIX) 40 MG tablet TAKE 1 TABLET (40 MG TOTAL) BY MOUTH 2 (TWO) TIMES DAILY BEFORE A MEAL.  [name] potassium chloride 20 MEQ/15ML (10%) SOLN Take 15 mLs (20 mEq total) by mouth 2 (two) times daily.  . sodium  bicarbonate 650 MG tablet Take 1 tablet (650 mg total) by mouth 2 (two) times daily.  [name] spironolactone (ALDACTONE) 50 MG tablet Take 1 tablet (50 mg total) by mouth daily.  [name] triamcinolone cream (KENALOG) 0.1 % Apply 1 application topically 2 (two) times daily.   No facility-administered encounter medications on file as of 03/24/2016.     Objective: Blood pressure 102/66, pulse 86, temperature 98.6 F (37 C), temperature source Oral, resp. rate 18, height 5' 4.5" (1.638 m),  weight 126 lb 8 oz (57.38 kg). Patient is alert and in no acute distress. Conjunctiva is pink. Sclera is nonicteric Oropharyngeal mucosa is normal. No neck masses or thyromegaly noted. Cardiac exam with regular rhythm normal S1 and S2. No murmur or gallop noted. Lungs are clear to auscultation. Abdomen is somewhat asymmetric secondary to prior surgeries but it is soft and nontender without organomegaly or masses. She has 1-2+ pitting edema involving both legs.  Labs/studies Results: Lab data from 01/01/2016  CRP 1.6   WBC 11.3, H&H 8.9 and 29.1 and platelet count 406K Serum sodium 137, potassium 3.4, chloride 114, CO2 16, BUN 10, creatinine 1.58 and serum albumin 3.1.   Assessment:  #1. Ileal and anorectal Crohn's disease. Perianal disease has healed as noted by Dr. Leighton Ruff patient saw yesterday. She will continue infliximab infusion every 6 weeks #2. GERD. She is on double dose PPI. She take second dose on as-needed basis #3. Anemia. She appears to have multifactorial anemia including chronic disease and iron deficiency. #4. Hypokalemia and hypomagnesemia. #5. Weight loss. Need to rule out Addison's disease. Will begin Megace. #6.  Chronic diarrhea secondary to Crohn's disease and IBS.  Plan:  Patient will have CBC, metabolic 7, serum magnesium, serum albumin and cortisol level at the time of infliximab infusion at day hospital tomorrow. New prescription given for Mycostatin and triamcinolone cream. Megace 20 mg by mouth daily. Patient will continue Cipro and metronidazole until she runs out of prescription. Patient encouraged to set up an appointment with Dr. Senaida Lange, her nephrologist for follow-up for chronic kidney disease. Office visit in 3 months.

## 2016-03-25 ENCOUNTER — Telehealth (INDEPENDENT_AMBULATORY_CARE_PROVIDER_SITE_OTHER): Payer: Self-pay | Admitting: *Deleted

## 2016-03-25 ENCOUNTER — Encounter (HOSPITAL_COMMUNITY)
Admission: RE | Admit: 2016-03-25 | Discharge: 2016-03-25 | Disposition: A | Discharge status: Home / Self Care | Payer: Medicare Other | Source: Ambulatory Visit | Attending: Internal Medicine | Admitting: Internal Medicine

## 2016-03-25 DIAGNOSIS — K5090 Crohn's disease, unspecified, without complications: Secondary | ICD-10-CM | POA: Insufficient documentation

## 2016-03-25 MED ORDER — LORATADINE 10 MG PO TABS
10.0000 mg | ORAL_TABLET | Freq: Once | ORAL | Status: AC
  Administered 2016-03-25: 10 mg via ORAL
  Filled 2016-03-25: qty 1

## 2016-03-25 MED ORDER — SODIUM CHLORIDE 0.9 % IV SOLN
5.0000 mg/kg | Freq: Once | INTRAVENOUS | Status: AC
  Administered 2016-03-25: 300 mg via INTRAVENOUS
  Filled 2016-03-25: qty 30

## 2016-03-25 MED ORDER — ACETAMINOPHEN 325 MG PO TABS
650.0000 mg | ORAL_TABLET | Freq: Once | ORAL | Status: AC
  Administered 2016-03-25: 650 mg via ORAL
  Filled 2016-03-25: qty 2

## 2016-03-25 MED ORDER — SODIUM CHLORIDE 0.9 % IV SOLN
Freq: Once | INTRAVENOUS | Status: AC
  Administered 2016-03-25: 11:00:00 via INTRAVENOUS

## 2016-03-25 NOTE — Telephone Encounter (Signed)
Patient needed for a PA to be done for the medication Megace. [employer] was called,spoke with Glorious. A PA done. Then we were conferenced in with a Pharmacist Luisa Dago. It was told that medicare no longer supports this medication for patient's diagnosis. This is now excluded.  An appeal can be done and within the next 60 days. May call [phone],opt 2 Fax number is [phone].  Patient was made aware. She states that she will just buy it for now but would appreciate an appeal being done. To be discussed with Dr.Rehman.

## 2016-03-27 ENCOUNTER — Other Ambulatory Visit (INDEPENDENT_AMBULATORY_CARE_PROVIDER_SITE_OTHER): Payer: Self-pay | Admitting: Internal Medicine

## 2016-03-27 DIAGNOSIS — R634 Abnormal weight loss: Secondary | ICD-10-CM | POA: Diagnosis not present

## 2016-03-27 DIAGNOSIS — K50918 Crohn's disease, unspecified, with other complication: Secondary | ICD-10-CM | POA: Diagnosis not present

## 2016-03-27 DIAGNOSIS — K50919 Crohn's disease, unspecified, with unspecified complications: Secondary | ICD-10-CM | POA: Diagnosis not present

## 2016-03-27 DIAGNOSIS — K5090 Crohn's disease, unspecified, without complications: Secondary | ICD-10-CM | POA: Diagnosis not present

## 2016-03-27 LAB — CBC
HEMATOCRIT: 30 % — AB (ref 35.0–45.0)
HEMOGLOBIN: 9.6 g/dL — AB (ref 11.7–15.5)
MCH: 24.9 pg — AB (ref 27.0–33.0)
MCHC: 32 g/dL (ref 32.0–36.0)
MCV: 77.9 fL — AB (ref 80.0–100.0)
MPV: 9.2 fL (ref 7.5–12.5)
Platelets: 419 10*3/uL — ABNORMAL HIGH (ref 140–400)
RBC: 3.85 MIL/uL (ref 3.80–5.10)
RDW: 16.6 % — ABNORMAL HIGH (ref 11.0–15.0)
WBC: 11.3 10*3/uL — AB (ref 3.8–10.8)

## 2016-03-27 LAB — MAGNESIUM: Magnesium: 1.4 mg/dL — ABNORMAL LOW (ref 1.5–2.5)

## 2016-03-28 LAB — ALBUMIN: Albumin: 3.2 g/dL — ABNORMAL LOW (ref 3.6–5.1)

## 2016-03-28 LAB — CORTISOL: Cortisol, Plasma: 8.6 ug/dL

## 2016-03-29 ENCOUNTER — Other Ambulatory Visit (INDEPENDENT_AMBULATORY_CARE_PROVIDER_SITE_OTHER): Payer: Self-pay | Admitting: Internal Medicine

## 2016-03-29 DIAGNOSIS — E876 Hypokalemia: Secondary | ICD-10-CM

## 2016-03-29 LAB — BASIC METABOLIC PANEL
BUN: 15 mg/dL (ref 7–25)
CHLORIDE: 115 mmol/L — AB (ref 98–110)
CO2: 17 mmol/L — AB (ref 20–31)
Calcium: 8.6 mg/dL (ref 8.6–10.4)
Creat: 1.35 mg/dL — ABNORMAL HIGH (ref 0.50–0.99)
Glucose, Bld: 71 mg/dL (ref 65–99)
Potassium: 3 mmol/L — ABNORMAL LOW (ref 3.5–5.3)
SODIUM: 149 mmol/L — AB (ref 135–146)

## 2016-03-29 MED ORDER — POTASSIUM CHLORIDE 20 MEQ/15ML (10%) PO SOLN: 20.0000 meq | Freq: Three times a day (TID) | ORAL | Status: DC

## 2016-03-30 ENCOUNTER — Other Ambulatory Visit (INDEPENDENT_AMBULATORY_CARE_PROVIDER_SITE_OTHER): Payer: Self-pay | Admitting: Internal Medicine

## 2016-03-30 ENCOUNTER — Telehealth (INDEPENDENT_AMBULATORY_CARE_PROVIDER_SITE_OTHER): Payer: Self-pay | Admitting: *Deleted

## 2016-03-30 DIAGNOSIS — K50918 Crohn's disease, unspecified, with other complication: Secondary | ICD-10-CM

## 2016-03-30 DIAGNOSIS — R7989 Other specified abnormal findings of blood chemistry: Secondary | ICD-10-CM

## 2016-03-30 NOTE — Telephone Encounter (Signed)
Per Dr.Rehman the patient will need to have labs drawn in 2 weeks. 

## 2016-04-01 ENCOUNTER — Encounter (INDEPENDENT_AMBULATORY_CARE_PROVIDER_SITE_OTHER): Payer: Self-pay | Admitting: *Deleted

## 2016-04-01 ENCOUNTER — Other Ambulatory Visit (INDEPENDENT_AMBULATORY_CARE_PROVIDER_SITE_OTHER): Payer: Self-pay | Admitting: *Deleted

## 2016-04-01 DIAGNOSIS — K50918 Crohn's disease, unspecified, with other complication: Secondary | ICD-10-CM

## 2016-04-01 DIAGNOSIS — R7989 Other specified abnormal findings of blood chemistry: Secondary | ICD-10-CM

## 2016-04-11 ENCOUNTER — Other Ambulatory Visit (INDEPENDENT_AMBULATORY_CARE_PROVIDER_SITE_OTHER): Payer: Self-pay | Admitting: Internal Medicine

## 2016-04-13 ENCOUNTER — Other Ambulatory Visit (INDEPENDENT_AMBULATORY_CARE_PROVIDER_SITE_OTHER): Payer: Self-pay | Admitting: Internal Medicine

## 2016-04-17 DIAGNOSIS — R7989 Other specified abnormal findings of blood chemistry: Secondary | ICD-10-CM | POA: Diagnosis not present

## 2016-04-17 DIAGNOSIS — K50918 Crohn's disease, unspecified, with other complication: Secondary | ICD-10-CM | POA: Diagnosis not present

## 2016-04-18 LAB — BASIC METABOLIC PANEL
BUN: 22 mg/dL (ref 7–25)
CALCIUM: 9 mg/dL (ref 8.6–10.4)
CO2: 18 mmol/L — AB (ref 20–31)
CREATININE: 1.38 mg/dL — AB (ref 0.50–0.99)
Chloride: 111 mmol/L — ABNORMAL HIGH (ref 98–110)
GLUCOSE: 70 mg/dL (ref 65–99)
Potassium: 4.1 mmol/L (ref 3.5–5.3)
Sodium: 139 mmol/L (ref 135–146)

## 2016-04-18 LAB — MAGNESIUM: Magnesium: 1.7 mg/dL (ref 1.5–2.5)

## 2016-04-20 DIAGNOSIS — Z6823 Body mass index (BMI) 23.0-23.9, adult: Secondary | ICD-10-CM | POA: Diagnosis not present

## 2016-04-20 DIAGNOSIS — K50113 Crohn's disease of large intestine with fistula: Secondary | ICD-10-CM | POA: Diagnosis not present

## 2016-04-20 DIAGNOSIS — K603 Anal fistula: Secondary | ICD-10-CM | POA: Diagnosis not present

## 2016-04-20 DIAGNOSIS — D510 Vitamin B12 deficiency anemia due to intrinsic factor deficiency: Secondary | ICD-10-CM | POA: Diagnosis not present

## 2016-04-24 ENCOUNTER — Telehealth (INDEPENDENT_AMBULATORY_CARE_PROVIDER_SITE_OTHER): Payer: Self-pay | Admitting: Internal Medicine

## 2016-04-24 NOTE — Telephone Encounter (Signed)
Patient is needing more quanity on the Nystatin powder. She has refills. Patient was advised that we will call the pharmacy and take care of it.

## 2016-04-24 NOTE — Telephone Encounter (Signed)
Patient left a message late yesterday asking that [name] call her.  She did not leave any other details.  [phone]

## 2016-04-27 DIAGNOSIS — D510 Vitamin B12 deficiency anemia due to intrinsic factor deficiency: Secondary | ICD-10-CM | POA: Diagnosis not present

## 2016-04-27 DIAGNOSIS — N183 Chronic kidney disease, stage 3 (moderate): Secondary | ICD-10-CM | POA: Diagnosis not present

## 2016-04-27 DIAGNOSIS — L409 Psoriasis, unspecified: Secondary | ICD-10-CM | POA: Diagnosis not present

## 2016-04-27 DIAGNOSIS — K50113 Crohn's disease of large intestine with fistula: Secondary | ICD-10-CM | POA: Diagnosis not present

## 2016-04-28 DIAGNOSIS — D631 Anemia in chronic kidney disease: Secondary | ICD-10-CM | POA: Diagnosis not present

## 2016-04-28 DIAGNOSIS — N2581 Secondary hyperparathyroidism of renal origin: Secondary | ICD-10-CM | POA: Diagnosis not present

## 2016-04-28 DIAGNOSIS — E785 Hyperlipidemia, unspecified: Secondary | ICD-10-CM | POA: Diagnosis not present

## 2016-04-28 DIAGNOSIS — N189 Chronic kidney disease, unspecified: Secondary | ICD-10-CM | POA: Diagnosis not present

## 2016-04-28 DIAGNOSIS — N183 Chronic kidney disease, stage 3 (moderate): Secondary | ICD-10-CM | POA: Diagnosis not present

## 2016-04-28 DIAGNOSIS — R809 Proteinuria, unspecified: Secondary | ICD-10-CM | POA: Diagnosis not present

## 2016-05-06 ENCOUNTER — Encounter (HOSPITAL_COMMUNITY)
Admission: RE | Admit: 2016-05-06 | Discharge: 2016-05-06 | Disposition: A | Discharge status: Home / Self Care | Payer: Medicare Other | Source: Ambulatory Visit | Attending: Internal Medicine | Admitting: Internal Medicine

## 2016-05-06 DIAGNOSIS — H31012 Macula scars of posterior pole (postinflammatory) (post-traumatic), left eye: Secondary | ICD-10-CM | POA: Diagnosis not present

## 2016-05-06 DIAGNOSIS — H43813 Vitreous degeneration, bilateral: Secondary | ICD-10-CM | POA: Diagnosis not present

## 2016-05-06 DIAGNOSIS — H35372 Puckering of macula, left eye: Secondary | ICD-10-CM | POA: Diagnosis not present

## 2016-05-06 DIAGNOSIS — K5090 Crohn's disease, unspecified, without complications: Secondary | ICD-10-CM | POA: Insufficient documentation

## 2016-05-06 DIAGNOSIS — H35361 Drusen (degenerative) of macula, right eye: Secondary | ICD-10-CM | POA: Diagnosis not present

## 2016-05-06 MED ORDER — LORATADINE 10 MG PO TABS
10.0000 mg | ORAL_TABLET | Freq: Every day | ORAL | Status: DC
  Administered 2016-05-06: 10 mg via ORAL
  Filled 2016-05-06: qty 1

## 2016-05-06 MED ORDER — ACETAMINOPHEN 325 MG PO TABS
650.0000 mg | ORAL_TABLET | Freq: Once | ORAL | Status: AC
  Administered 2016-05-06: 650 mg via ORAL
  Filled 2016-05-06: qty 2

## 2016-05-06 MED ORDER — SODIUM CHLORIDE 0.9 % IV SOLN
INTRAVENOUS | Status: DC
  Administered 2016-05-06: 10:00:00 via INTRAVENOUS

## 2016-05-06 MED ORDER — SODIUM CHLORIDE 0.9 % IV SOLN
300.0000 mg | INTRAVENOUS | Status: DC
  Administered 2016-05-06: 300 mg via INTRAVENOUS
  Filled 2016-05-06: qty 30

## 2016-05-11 ENCOUNTER — Other Ambulatory Visit (INDEPENDENT_AMBULATORY_CARE_PROVIDER_SITE_OTHER): Payer: Self-pay | Admitting: Internal Medicine

## 2016-05-13 ENCOUNTER — Other Ambulatory Visit (INDEPENDENT_AMBULATORY_CARE_PROVIDER_SITE_OTHER): Payer: Self-pay | Admitting: Internal Medicine

## 2016-05-14 DIAGNOSIS — H35372 Puckering of macula, left eye: Secondary | ICD-10-CM | POA: Diagnosis not present

## 2016-05-14 DIAGNOSIS — H31012 Macula scars of posterior pole (postinflammatory) (post-traumatic), left eye: Secondary | ICD-10-CM | POA: Diagnosis not present

## 2016-05-15 DIAGNOSIS — H35372 Puckering of macula, left eye: Secondary | ICD-10-CM | POA: Diagnosis not present

## 2016-05-21 DIAGNOSIS — N183 Chronic kidney disease, stage 3 (moderate): Secondary | ICD-10-CM | POA: Diagnosis not present

## 2016-05-24 ENCOUNTER — Other Ambulatory Visit (INDEPENDENT_AMBULATORY_CARE_PROVIDER_SITE_OTHER): Payer: Self-pay | Admitting: Internal Medicine

## 2016-06-10 DIAGNOSIS — R600 Localized edema: Secondary | ICD-10-CM | POA: Diagnosis not present

## 2016-06-10 DIAGNOSIS — K5090 Crohn's disease, unspecified, without complications: Secondary | ICD-10-CM | POA: Diagnosis not present

## 2016-06-10 DIAGNOSIS — G510 Bell's palsy: Secondary | ICD-10-CM | POA: Diagnosis not present

## 2016-06-10 DIAGNOSIS — K219 Gastro-esophageal reflux disease without esophagitis: Secondary | ICD-10-CM | POA: Diagnosis not present

## 2016-06-17 ENCOUNTER — Encounter (HOSPITAL_COMMUNITY)
Admission: RE | Admit: 2016-06-17 | Discharge: 2016-06-17 | Disposition: A | Discharge status: Home / Self Care | Payer: Commercial Managed Care - HMO | Source: Ambulatory Visit | Attending: Internal Medicine | Admitting: Internal Medicine

## 2016-06-17 DIAGNOSIS — K5090 Crohn's disease, unspecified, without complications: Secondary | ICD-10-CM | POA: Insufficient documentation

## 2016-06-17 MED ORDER — ACETAMINOPHEN 325 MG PO TABS
ORAL_TABLET | ORAL | Status: AC
  Filled 2016-06-17: qty 2

## 2016-06-17 MED ORDER — SODIUM CHLORIDE 0.9 % IV SOLN
Freq: Once | INTRAVENOUS | Status: AC
  Administered 2016-06-17: 200 mL via INTRAVENOUS

## 2016-06-17 MED ORDER — LORATADINE 10 MG PO TABS
10.0000 mg | ORAL_TABLET | Freq: Once | ORAL | Status: AC
  Administered 2016-06-17: 10 mg via ORAL

## 2016-06-17 MED ORDER — ACETAMINOPHEN 325 MG PO TABS
650.0000 mg | ORAL_TABLET | Freq: Once | ORAL | Status: AC
  Administered 2016-06-17: 650 mg via ORAL

## 2016-06-17 MED ORDER — LORATADINE 10 MG PO TABS
ORAL_TABLET | ORAL | Status: AC
  Filled 2016-06-17: qty 1

## 2016-06-17 MED ORDER — SODIUM CHLORIDE 0.9 % IV SOLN
300.0000 mg | INTRAVENOUS | Status: DC
  Administered 2016-06-17: 300 mg via INTRAVENOUS
  Filled 2016-06-17: qty 30

## 2016-06-19 DIAGNOSIS — Z48810 Encounter for surgical aftercare following surgery on the sense organs: Secondary | ICD-10-CM | POA: Diagnosis not present

## 2016-06-19 DIAGNOSIS — H35372 Puckering of macula, left eye: Secondary | ICD-10-CM | POA: Diagnosis not present

## 2016-06-25 ENCOUNTER — Other Ambulatory Visit (INDEPENDENT_AMBULATORY_CARE_PROVIDER_SITE_OTHER): Payer: Self-pay | Admitting: Internal Medicine

## 2016-06-30 ENCOUNTER — Encounter (INDEPENDENT_AMBULATORY_CARE_PROVIDER_SITE_OTHER): Payer: Self-pay | Admitting: Internal Medicine

## 2016-06-30 ENCOUNTER — Encounter (INDEPENDENT_AMBULATORY_CARE_PROVIDER_SITE_OTHER): Payer: Self-pay | Admitting: *Deleted

## 2016-06-30 ENCOUNTER — Ambulatory Visit (INDEPENDENT_AMBULATORY_CARE_PROVIDER_SITE_OTHER): Payer: Commercial Managed Care - HMO | Admitting: Internal Medicine

## 2016-06-30 VITALS — BP 108/66 | HR 70 | Temp 97.8°F | Ht 64.4 in | Wt 149.2 lb

## 2016-06-30 DIAGNOSIS — R634 Abnormal weight loss: Secondary | ICD-10-CM

## 2016-06-30 DIAGNOSIS — K219 Gastro-esophageal reflux disease without esophagitis: Secondary | ICD-10-CM | POA: Diagnosis not present

## 2016-06-30 DIAGNOSIS — D649 Anemia, unspecified: Secondary | ICD-10-CM

## 2016-06-30 DIAGNOSIS — M810 Age-related osteoporosis without current pathological fracture: Secondary | ICD-10-CM

## 2016-06-30 DIAGNOSIS — R600 Localized edema: Secondary | ICD-10-CM

## 2016-06-30 DIAGNOSIS — K50918 Crohn's disease, unspecified, with other complication: Secondary | ICD-10-CM | POA: Diagnosis not present

## 2016-06-30 MED ORDER — POTASSIUM CHLORIDE CRYS ER 20 MEQ PO TBCR: 20.0000 meq | EXTENDED_RELEASE_TABLET | Freq: Every day | ORAL | 1 refills | Status: DC | PRN

## 2016-06-30 MED ORDER — FUROSEMIDE 40 MG PO TABS: 40.0000 mg | ORAL_TABLET | Freq: Every day | ORAL | 1 refills | Status: DC | PRN

## 2016-06-30 NOTE — Patient Instructions (Signed)
Take furosemide on as-needed basis. Take potassium chloride on days 2 take furosemide.. You can weight 10 days before your blood work. Decrease Megace dose to 80 mg a day which is 2 tablets daily.

## 2016-06-30 NOTE — Progress Notes (Signed)
Presenting complaint;  Follow-up for Crohn's disease and weight loss.  Subjective:  Danielle [name] is 70 year old African-American female who has multiple medical problems including Crohn disease is here for scheduled visit. She was last seen on 03/24/2016 when she weighed 126 pounds. Fasting cortisol level was normal. She was begun on Megace at a dose of 200 mg by mouth daily. She says since then she's been eating very well. She has gained 23 pounds. She also complains of lower extremity edema which started about 2 weeks ago. She is on low-salt diet. She states she is hooked up on nabs. She is having 2-3 stools per day. Most of her stools as soft to formed. She denies melena or rectal bleeding abdominal pain or rectal discharge. Heartburn is well controlled with PPI. She denies dysphagia nausea or vomiting. She has not taken Zofran lately. She did follow with Dr. Senaida Lange regarding chronic kidney disease and is to see him in 6 months. She says last potassium was high and Dr. Berdine Addison stopped her KCl. She remains on spironolactone.   Current Medications: Outpatient Encounter Prescriptions as of 06/30/2016  Medication Sig  . acetaminophen (TYLENOL) 500 MG tablet Take 1,000 mg by mouth every 6 (six) hours as needed for mild pain or moderate pain.  [name] albuterol (PROVENTIL HFA;VENTOLIN HFA) 108 (90 Base) MCG/ACT inhaler Inhale 2 puffs into the lungs every 6 (six) hours as needed for wheezing.  . Calcium Carbonate-Vitamin D (CALCIUM + D PO) Take 1 tablet by mouth daily.  . Cyanocobalamin (VITAMIN B-12 IJ) Inject 1 each as directed every 30 (thirty) days. Around the 1st week of each month  . dicyclomine (BENTYL) 10 MG/5ML syrup TAKE 2 TEASPOONFULS BY MOUTH 3 TIMES A DAY BEFORE MEALS  . diphenhydrAMINE (BENADRYL) 25 mg capsule Take 25 mg by mouth every 6 (six) hours as needed for itching.  . diphenoxylate-atropine (LOMOTIL) 2.5-0.025 MG tablet TAKE 2 TABLETS BY MOUTH TWICE A DAY  . fluconazole (DIFLUCAN) 150 MG tablet  TAKE 1 TABLET BY MOUTH NOW, THEN 7 DAYS LATER  . HYDROcodone-acetaminophen (NORCO) 7.5-325 MG per tablet Take 1 tablet by mouth every 4 (four) hours as needed for moderate pain.   [name] inFLIXimab (REMICADE) 100 MG injection Inject 100 mg into the vein every 6 (six) weeks.  . megestrol (MEGACE) 40 MG tablet Take 5 tablets (200 mg total) by mouth daily.  . Multiple Vitamin (MULTIVITAMIN WITH MINERALS) TABS Take 1 tablet by mouth daily.  [name] nystatin cream (MYCOSTATIN) Apply 1 application topically 2 (two) times daily as needed for dry skin.  . NYSTATIN powder USE TO AFFECTED AREA TWICE DAILY  . ondansetron (ZOFRAN) 4 MG tablet Take 1 tablet (4 mg total) by mouth as needed for nausea or vomiting. Must 30 days before next refill  . pantoprazole (PROTONIX) 40 MG tablet TAKE 1 TABLET (40 MG TOTAL) BY MOUTH 2 (TWO) TIMES DAILY BEFORE A MEAL.  . predniSONE (DELTASONE) 20 MG tablet TAKE 1/2 TABLET BY MOUTH EVERY DAY WITH BREAKFAST  . spironolactone (ALDACTONE) 50 MG tablet Take 1 tablet (50 mg total) by mouth daily.  [name] triamcinolone cream (KENALOG) 0.1 % Apply 1 application topically 2 (two) times daily as needed.  . [DISCONTINUED] ciprofloxacin (CIPRO) 500 MG tablet TAKE 1 TABLET BY MOUTH TWICE A DAY (Patient not taking: Reported on 06/30/2016)  . [DISCONTINUED] magnesium oxide (MAG-OX) 400 (241.3 MG) MG tablet Take 1 tablet (400 mg total) by mouth daily. (Patient not taking: Reported on 06/30/2016)  . [DISCONTINUED] metroNIDAZOLE (FLAGYL) 500 MG  tablet TAKE 1 TABLET BY MOUTH 3 TIMES A DAY (Patient not taking: Reported on 06/30/2016)  . [DISCONTINUED] potassium chloride 20 MEQ/15ML (10%) SOLN Take 15 mLs (20 mEq total) by mouth 3 (three) times daily. (Patient not taking: Reported on 06/30/2016)  . [DISCONTINUED] sodium bicarbonate 650 MG tablet Take 1 tablet (650 mg total) by mouth 2 (two) times daily. (Patient not taking: Reported on 06/30/2016)   No facility-administered encounter medications on file as of  06/30/2016.      Objective: Blood pressure 108/66, pulse 70, temperature 97.8 F (36.6 C), temperature source Oral, height 5' 4.4" (1.636 m), weight 149 lb 3.2 oz (67.7 kg). Patient is alert and in no acute distress. Conjunctiva is pink. Sclera is nonicteric Oropharyngeal mucosa is normal. No neck masses or thyromegaly noted. Cardiac exam with regular rhythm normal S1 and S2. No murmur or gallop noted. Lungs are clear to auscultation. Abdomen is symmetrical. On palpation is soft and nontender without organomegaly or masses.  She has 2+ pitting edema to both legs. It is more pronounced in the left leg where skin is shiny but no skin breakdown noted.  Labs/studies Results: Lab data from 03/27/2016  WBC 11.3, H&H 9.6 and 30.0 and platelet count 419K    Assessment:  #1. Ileal and anorectal Crohn's disease. Patient is on infliximab and appears to be in remission. She has been off prednisone for more than 3 weeks. #2. Weight loss. Cortisol level was normal about 3 months ago. She has gained 23 pounds in the last 13 weeks. Some of her weight gain appears to be due to lower extremity edema but more so weight gain appears to be real. Would back off on Megace. #3. Bilateral lower extremity edema. Patient needs to be back on diuretic. She will use it daily for 2-3 days and thereafter on as-needed basis. #4. Osteoporosis. She is on calcium and vitamin D but no other medication. She was seen by Dr. Charlestine Night 3 years ago but apparently no therapy recommended. #5. Anemia. She has multi-factorial anemia including iron deficiency.   Plan:  Decrease Megace to 80 mg by mouth daily. Furosemide 40 mg by mouth daily when necessary. KCl 20 mg by mouth daily when necessary. Patient advised to take KCl only on days when she takes furosemide. Patient will go to the lab for CBC, metabolic 7, serum albumin and vitamin D2 level. Bone density study. Office visit in 3 months.

## 2016-07-08 ENCOUNTER — Other Ambulatory Visit (HOSPITAL_COMMUNITY): Payer: Commercial Managed Care - HMO

## 2016-07-11 ENCOUNTER — Other Ambulatory Visit (INDEPENDENT_AMBULATORY_CARE_PROVIDER_SITE_OTHER): Payer: Self-pay | Admitting: Internal Medicine

## 2016-07-15 ENCOUNTER — Ambulatory Visit (HOSPITAL_COMMUNITY)
Admission: RE | Admit: 2016-07-15 | Discharge: 2016-07-15 | Disposition: A | Discharge status: Home / Self Care | Payer: Commercial Managed Care - HMO | Source: Ambulatory Visit | Attending: Internal Medicine | Admitting: Internal Medicine

## 2016-07-15 DIAGNOSIS — M810 Age-related osteoporosis without current pathological fracture: Secondary | ICD-10-CM | POA: Diagnosis not present

## 2016-07-16 DIAGNOSIS — R600 Localized edema: Secondary | ICD-10-CM | POA: Diagnosis not present

## 2016-07-16 DIAGNOSIS — D649 Anemia, unspecified: Secondary | ICD-10-CM | POA: Diagnosis not present

## 2016-07-16 DIAGNOSIS — M810 Age-related osteoporosis without current pathological fracture: Secondary | ICD-10-CM | POA: Diagnosis not present

## 2016-07-16 LAB — CBC
HEMATOCRIT: 31.9 % — AB (ref 35.0–45.0)
Hemoglobin: 10 g/dL — ABNORMAL LOW (ref 11.7–15.5)
MCH: 26.1 pg — AB (ref 27.0–33.0)
MCHC: 31.3 g/dL — AB (ref 32.0–36.0)
MCV: 83.3 fL (ref 80.0–100.0)
MPV: 9.4 fL (ref 7.5–12.5)
PLATELETS: 355 10*3/uL (ref 140–400)
RBC: 3.83 MIL/uL (ref 3.80–5.10)
RDW: 14.3 % (ref 11.0–15.0)
WBC: 12.6 10*3/uL — ABNORMAL HIGH (ref 3.8–10.8)

## 2016-07-17 LAB — ALBUMIN: Albumin: 3.5 g/dL — ABNORMAL LOW (ref 3.6–5.1)

## 2016-07-17 LAB — BASIC METABOLIC PANEL
BUN: 21 mg/dL (ref 7–25)
CALCIUM: 8.7 mg/dL (ref 8.6–10.4)
CO2: 17 mmol/L — AB (ref 20–31)
CREATININE: 1.25 mg/dL — AB (ref 0.50–0.99)
Chloride: 110 mmol/L (ref 98–110)
GLUCOSE: 73 mg/dL (ref 65–99)
Potassium: 4.1 mmol/L (ref 3.5–5.3)
SODIUM: 135 mmol/L (ref 135–146)

## 2016-07-17 LAB — VITAMIN D 25 HYDROXY (VIT D DEFICIENCY, FRACTURES): Vit D, 25-Hydroxy: 9 ng/mL — ABNORMAL LOW (ref 30–100)

## 2016-07-19 ENCOUNTER — Other Ambulatory Visit (INDEPENDENT_AMBULATORY_CARE_PROVIDER_SITE_OTHER): Payer: Self-pay | Admitting: Internal Medicine

## 2016-07-19 MED ORDER — VITAMIN D (ERGOCALCIFEROL) 1.25 MG (50000 UNIT) PO CAPS: 50000.0000 [IU] | ORAL_CAPSULE | ORAL | 0 refills | Status: DC

## 2016-07-20 DIAGNOSIS — K5090 Crohn's disease, unspecified, without complications: Secondary | ICD-10-CM | POA: Diagnosis not present

## 2016-07-20 DIAGNOSIS — M810 Age-related osteoporosis without current pathological fracture: Secondary | ICD-10-CM | POA: Diagnosis not present

## 2016-07-20 DIAGNOSIS — D510 Vitamin B12 deficiency anemia due to intrinsic factor deficiency: Secondary | ICD-10-CM | POA: Diagnosis not present

## 2016-07-20 DIAGNOSIS — K219 Gastro-esophageal reflux disease without esophagitis: Secondary | ICD-10-CM | POA: Diagnosis not present

## 2016-07-23 ENCOUNTER — Other Ambulatory Visit (INDEPENDENT_AMBULATORY_CARE_PROVIDER_SITE_OTHER): Payer: Self-pay | Admitting: Internal Medicine

## 2016-07-29 ENCOUNTER — Encounter (HOSPITAL_COMMUNITY)
Admission: RE | Admit: 2016-07-29 | Discharge: 2016-07-29 | Disposition: A | Discharge status: Home / Self Care | Payer: Commercial Managed Care - HMO | Source: Ambulatory Visit | Attending: Internal Medicine | Admitting: Internal Medicine

## 2016-07-29 DIAGNOSIS — K5090 Crohn's disease, unspecified, without complications: Secondary | ICD-10-CM | POA: Insufficient documentation

## 2016-07-29 MED ORDER — ACETAMINOPHEN 325 MG PO TABS
650.0000 mg | ORAL_TABLET | Freq: Once | ORAL | Status: AC
  Administered 2016-07-29: 650 mg via ORAL

## 2016-07-29 MED ORDER — SODIUM CHLORIDE 0.9 % IV SOLN
INTRAVENOUS | Status: DC
  Administered 2016-07-29: 10:00:00 via INTRAVENOUS

## 2016-07-29 MED ORDER — LORATADINE 10 MG PO TABS
10.0000 mg | ORAL_TABLET | Freq: Every day | ORAL | Status: DC
  Administered 2016-07-29: 10 mg via ORAL

## 2016-07-29 MED ORDER — LORATADINE 10 MG PO TABS
ORAL_TABLET | ORAL | Status: AC
  Filled 2016-07-29: qty 1

## 2016-07-29 MED ORDER — ACETAMINOPHEN 325 MG PO TABS
ORAL_TABLET | ORAL | Status: AC
  Filled 2016-07-29: qty 2

## 2016-07-29 MED ORDER — SODIUM CHLORIDE 0.9 % IV SOLN
300.0000 mg | INTRAVENOUS | Status: DC
  Administered 2016-07-29: 300 mg via INTRAVENOUS
  Filled 2016-07-29: qty 10

## 2016-08-03 DIAGNOSIS — E785 Hyperlipidemia, unspecified: Secondary | ICD-10-CM | POA: Diagnosis not present

## 2016-08-03 DIAGNOSIS — N183 Chronic kidney disease, stage 3 (moderate): Secondary | ICD-10-CM | POA: Diagnosis not present

## 2016-08-03 DIAGNOSIS — N2581 Secondary hyperparathyroidism of renal origin: Secondary | ICD-10-CM | POA: Diagnosis not present

## 2016-08-03 DIAGNOSIS — R809 Proteinuria, unspecified: Secondary | ICD-10-CM | POA: Diagnosis not present

## 2016-08-03 DIAGNOSIS — D631 Anemia in chronic kidney disease: Secondary | ICD-10-CM | POA: Diagnosis not present

## 2016-08-04 DIAGNOSIS — H2511 Age-related nuclear cataract, right eye: Secondary | ICD-10-CM | POA: Diagnosis not present

## 2016-08-04 DIAGNOSIS — H25011 Cortical age-related cataract, right eye: Secondary | ICD-10-CM | POA: Diagnosis not present

## 2016-08-04 DIAGNOSIS — H18413 Arcus senilis, bilateral: Secondary | ICD-10-CM | POA: Diagnosis not present

## 2016-08-11 IMAGING — CR DG ABDOMEN 2V
2 series · 2 of 2 positions shown · non-contrast
Comparison: 01/26/2015

CLINICAL DATA: History of Crohn disease. Vomiting, diarrhea, and
epigastric pain today.

EXAM:
ABDOMEN - 2 VIEW

[w abdomen upright]
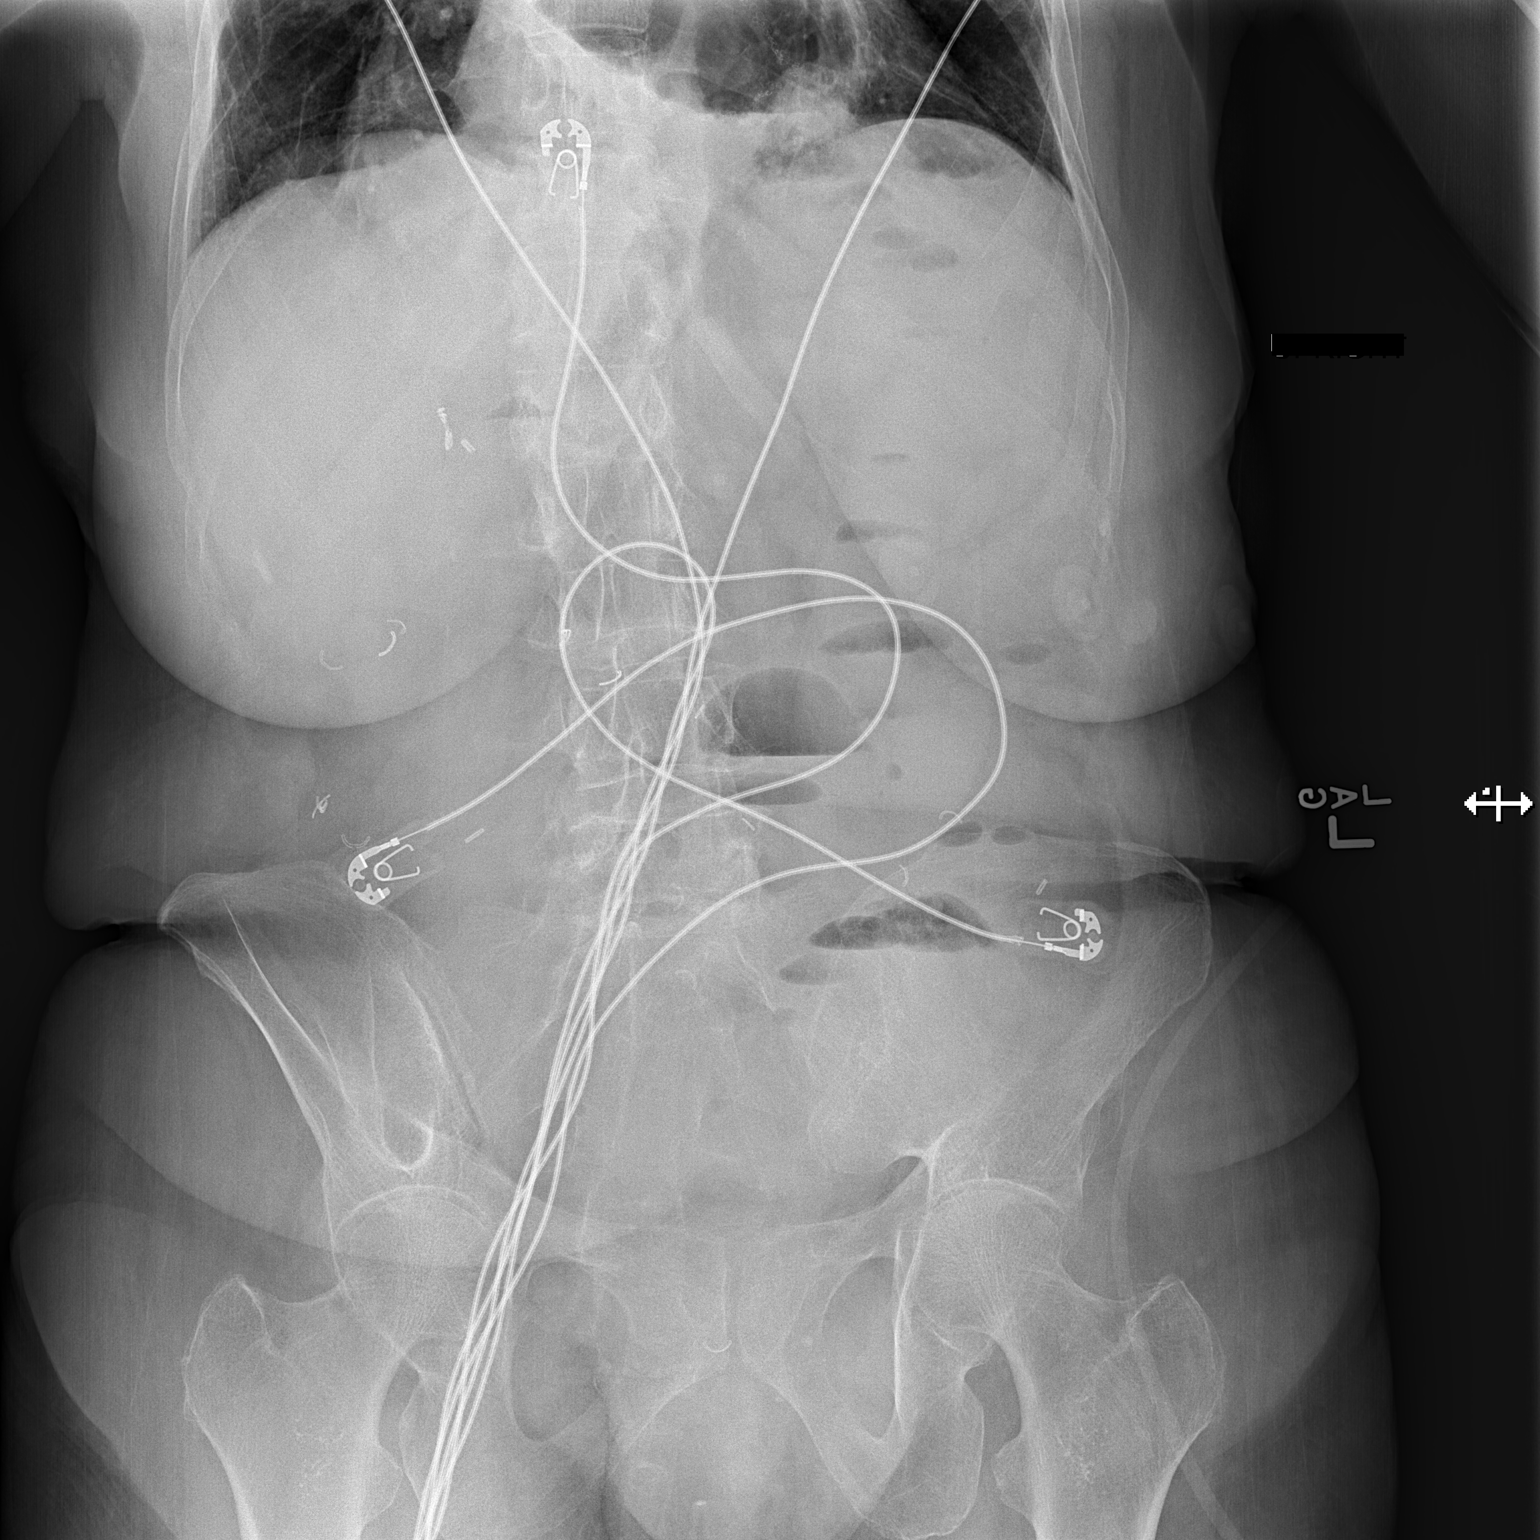

[t abdomen supine]
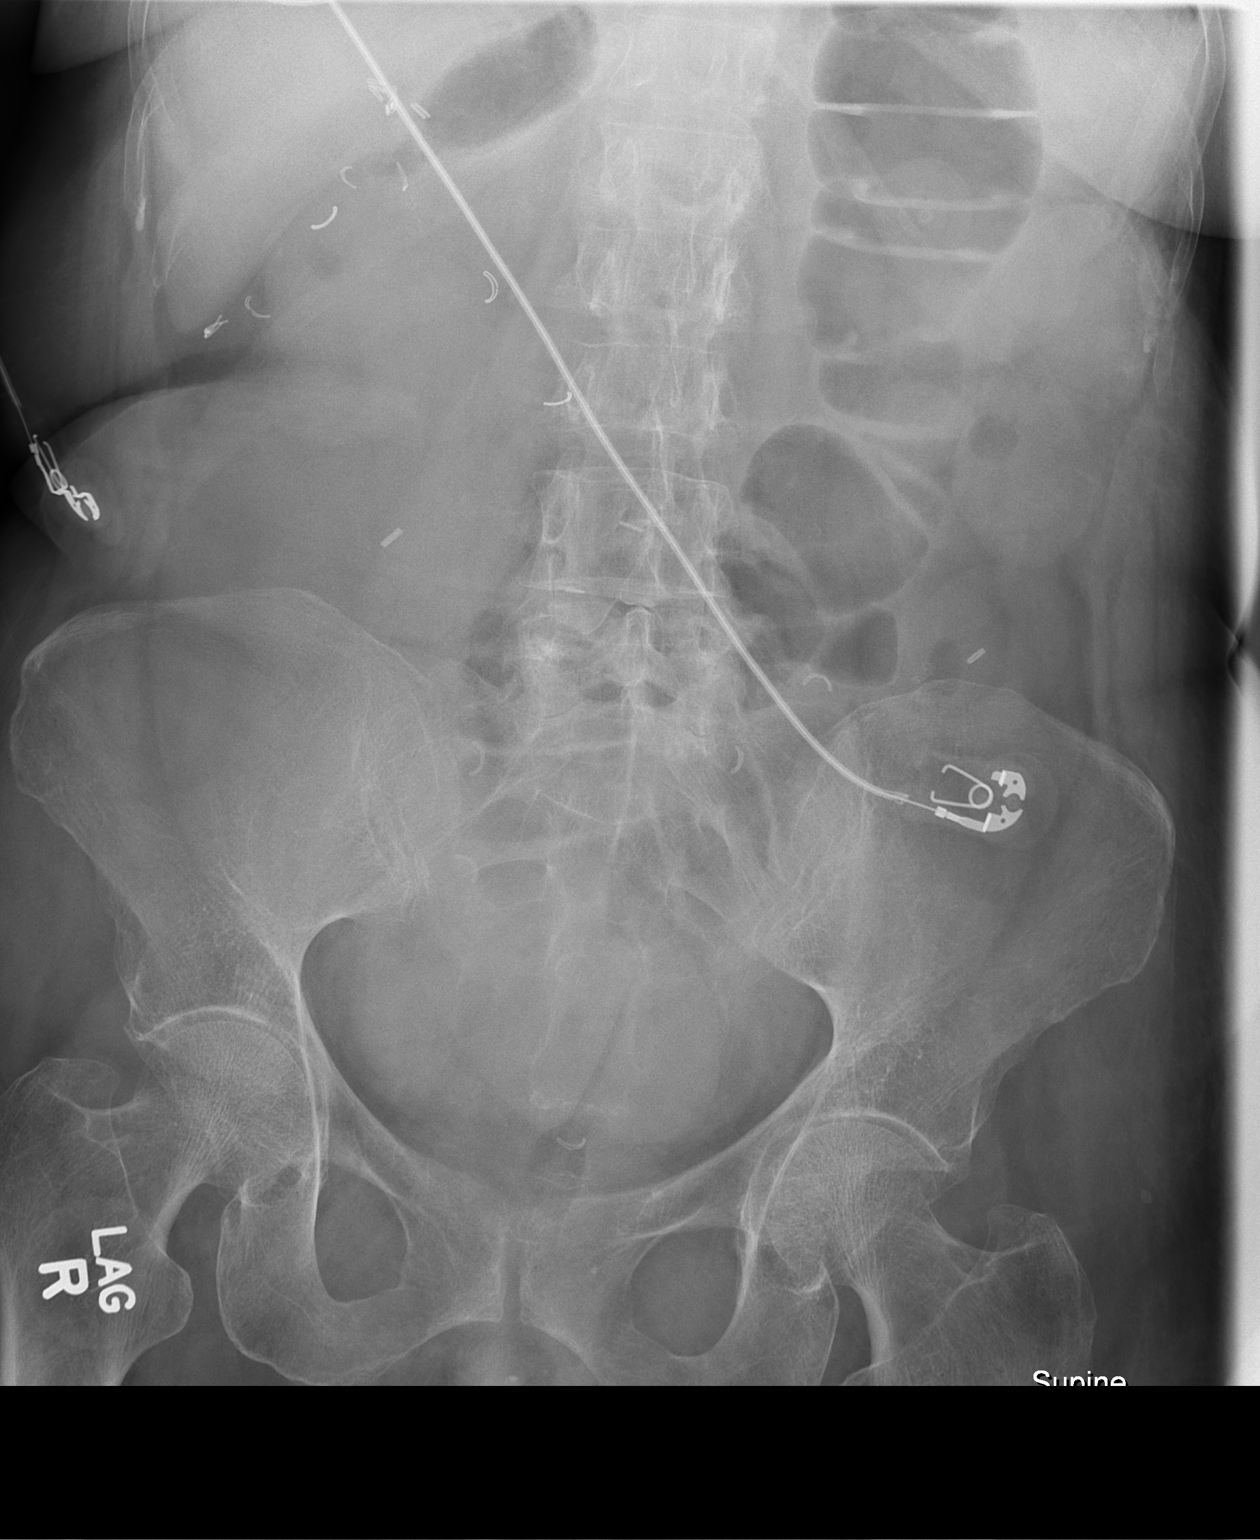

[2 of 2 positions shown; findings below may reference images not displayed]

FINDINGS: Gas in the colon with paucity of gas in the small bowel. Multiple
small air-fluid levels are demonstrated. Findings suggest possible
small bowel obstruction with predominantly fluid-filled loops.
Surgical clips in the abdomen. No radiopaque stones. Degenerative
changes in the spine and hips. No free intra-abdominal air.
IMPRESSION: Small air-fluid levels with paucity of gas in the small bowel
suggest possible small bowel obstruction with predominantly
fluid-filled loops.

## 2016-08-11 IMAGING — CR DG CHEST 2V
2 series · 2 of 2 positions shown · non-contrast
Comparison: 07/08/2014

CLINICAL DATA: Left-sided chest pain starting at noon today.
Nausea, vomiting, shortness of breath, lightheadedness, and
diarrhea. History of Crohn.

EXAM:
CHEST  2 VIEW

[w chest pa]
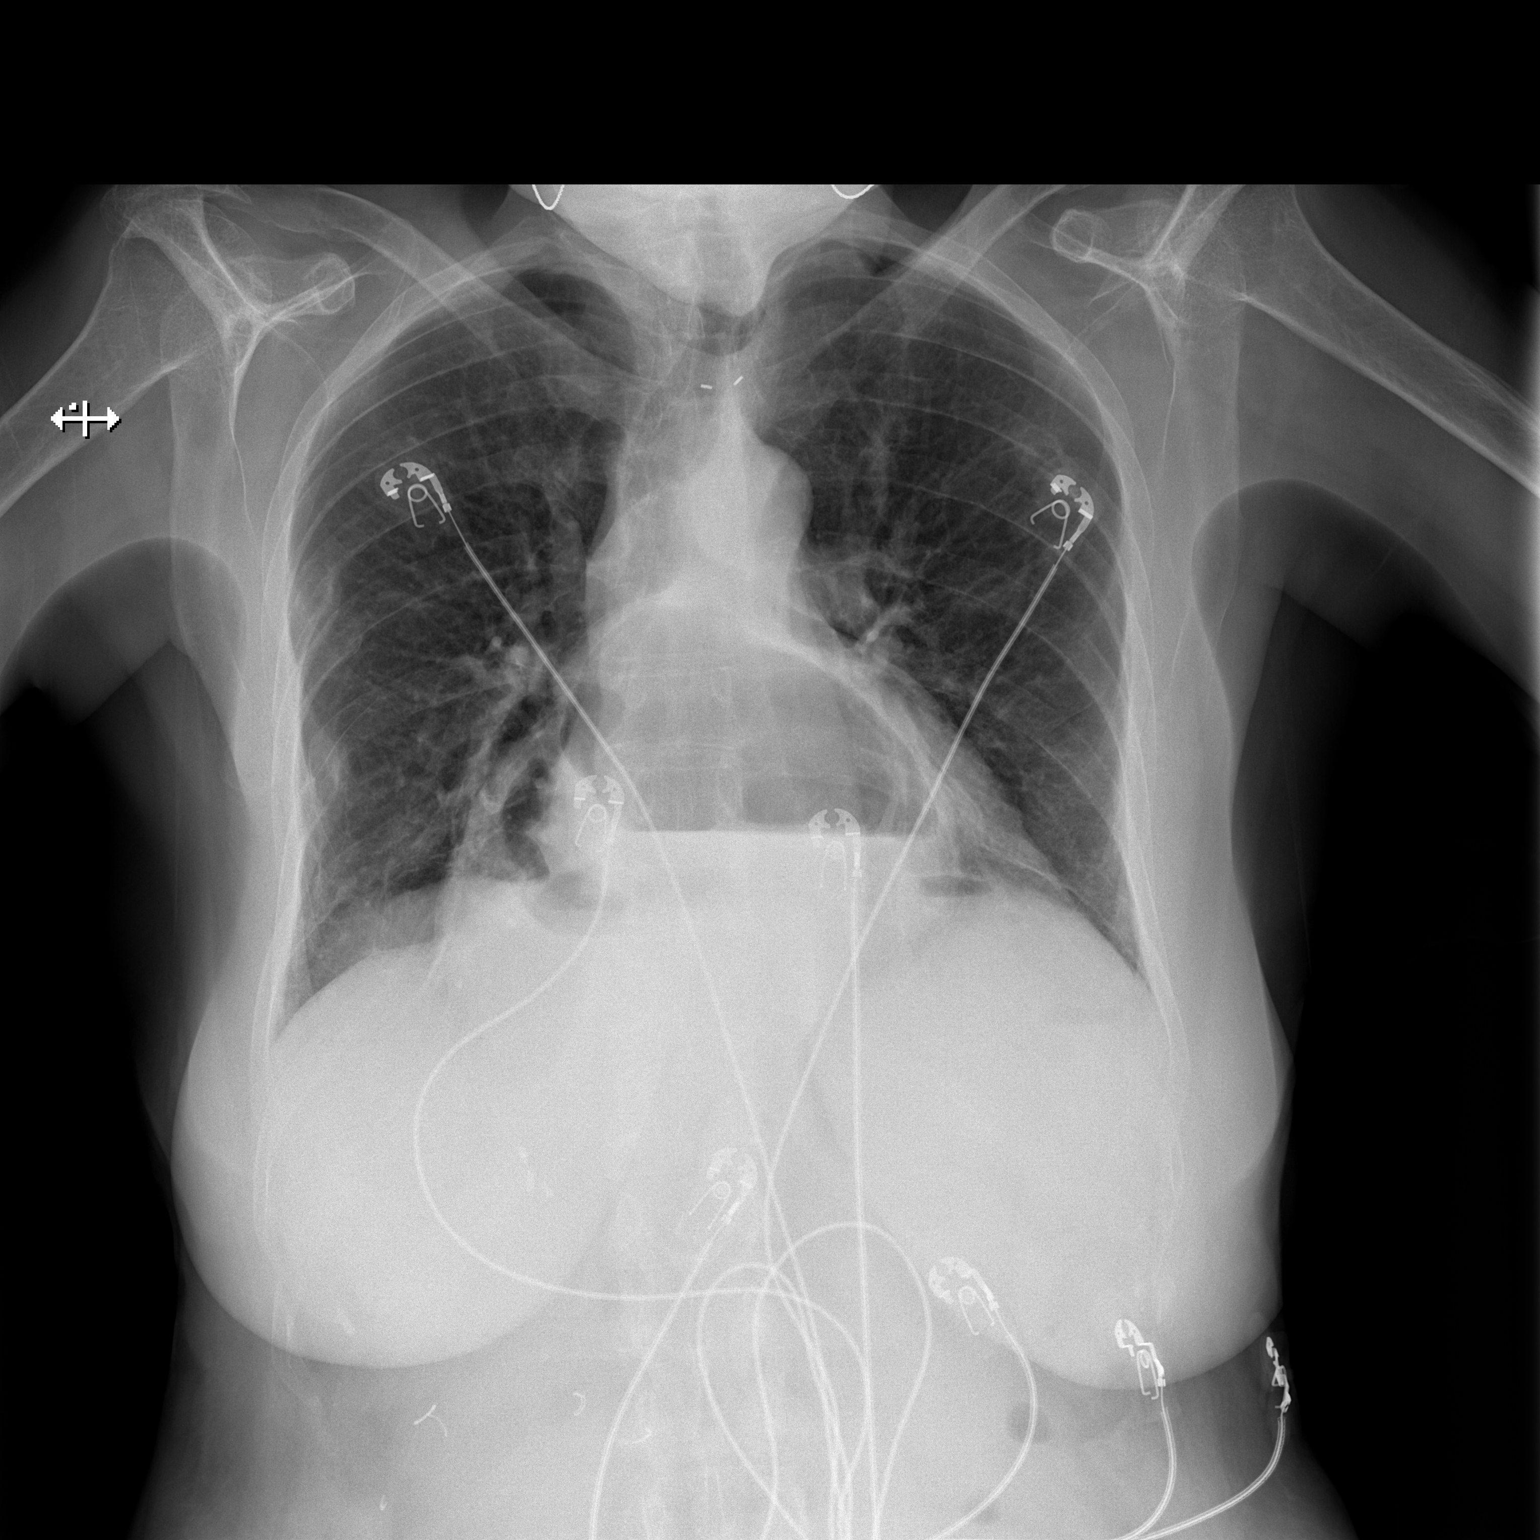

[w chest lat]
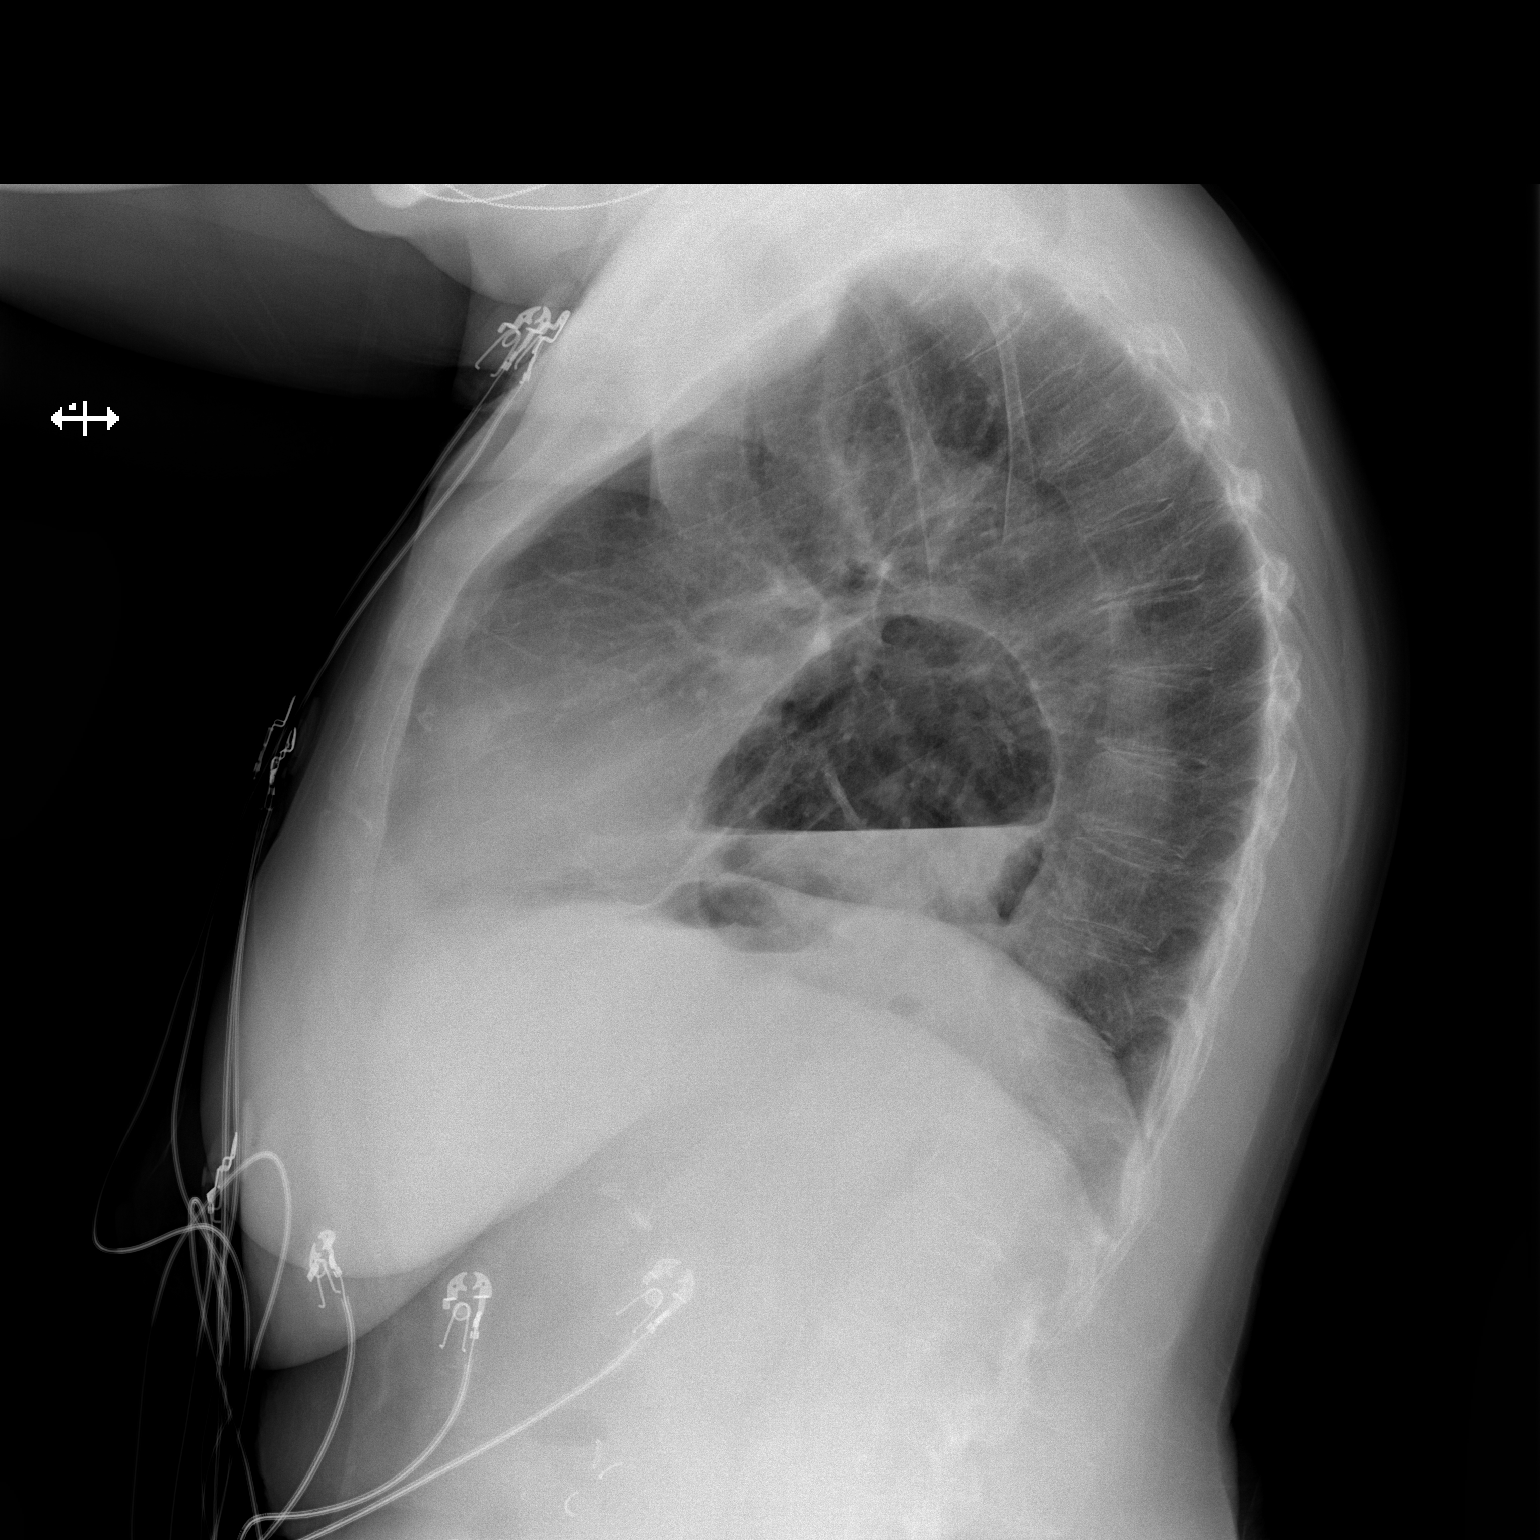

[2 of 2 positions shown; findings below may reference images not displayed]

FINDINGS: Normal heart size and pulmonary vascularity. Large esophageal hiatal
hernia behind the heart. No focal airspace disease or consolidation
in the lungs. No blunting of costophrenic angles. No pneumothorax.
Multiple old right rib fractures. Degenerative changes in the
shoulders. Surgical clips in the right upper quadrant. No
significant change since prior study.
IMPRESSION: Large esophageal hiatal hernia behind the heart. No evidence of
active pulmonary disease.

## 2016-08-28 DIAGNOSIS — H2511 Age-related nuclear cataract, right eye: Secondary | ICD-10-CM | POA: Diagnosis not present

## 2016-09-01 IMAGING — CR DG KNEE COMPLETE 4+V*L*
4 series · 4 of 4 positions shown · non-contrast
Comparison: None.

CLINICAL DATA: Patient missed a step at home this morning with
hyperflexion of the left knee.

EXAM:
LEFT KNEE - COMPLETE 4+ VIEW

[x knee ap left (1 of 4)]
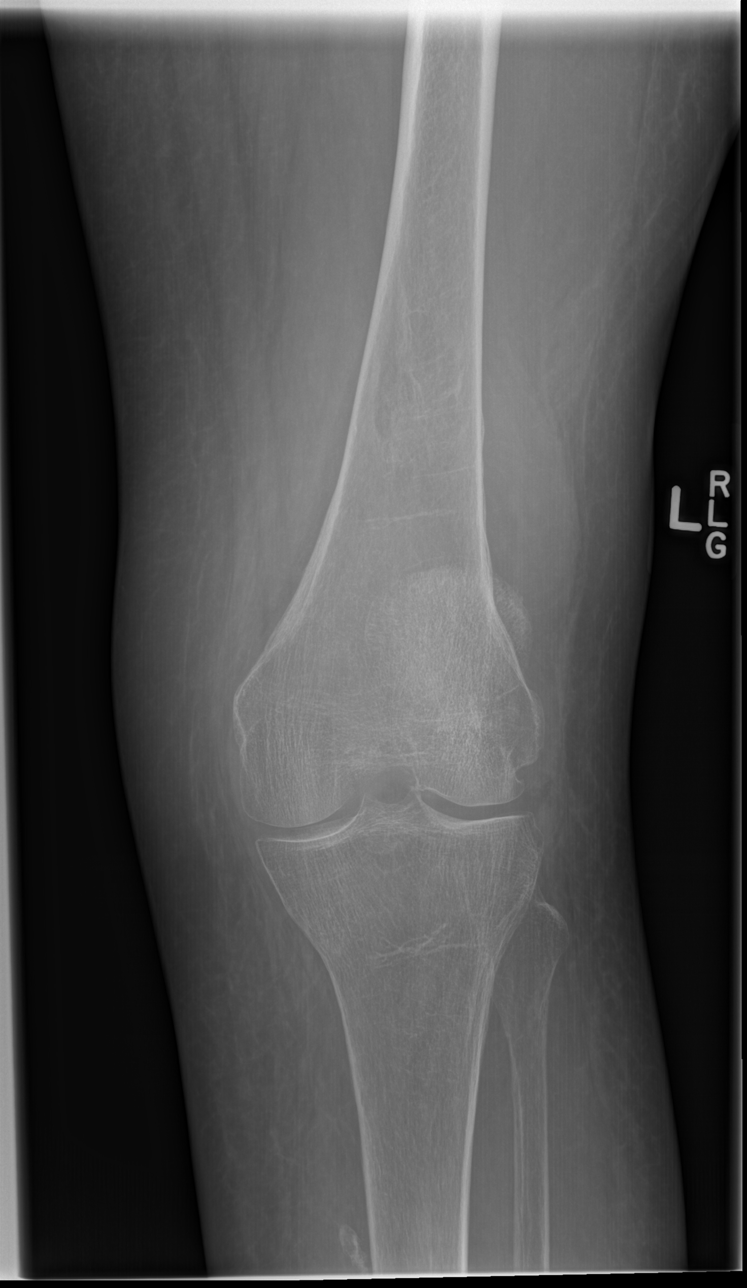

[x knee ap left (2 of 4)]
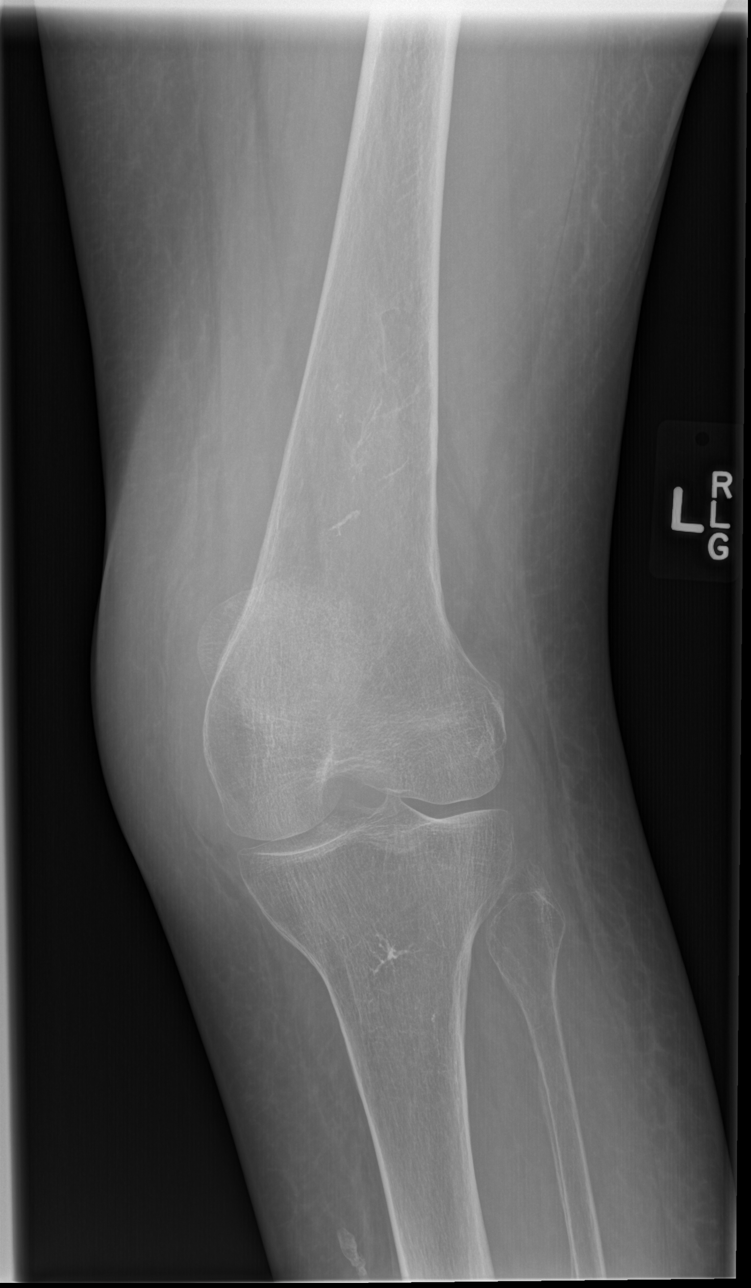

[x knee ap left (3 of 4)]
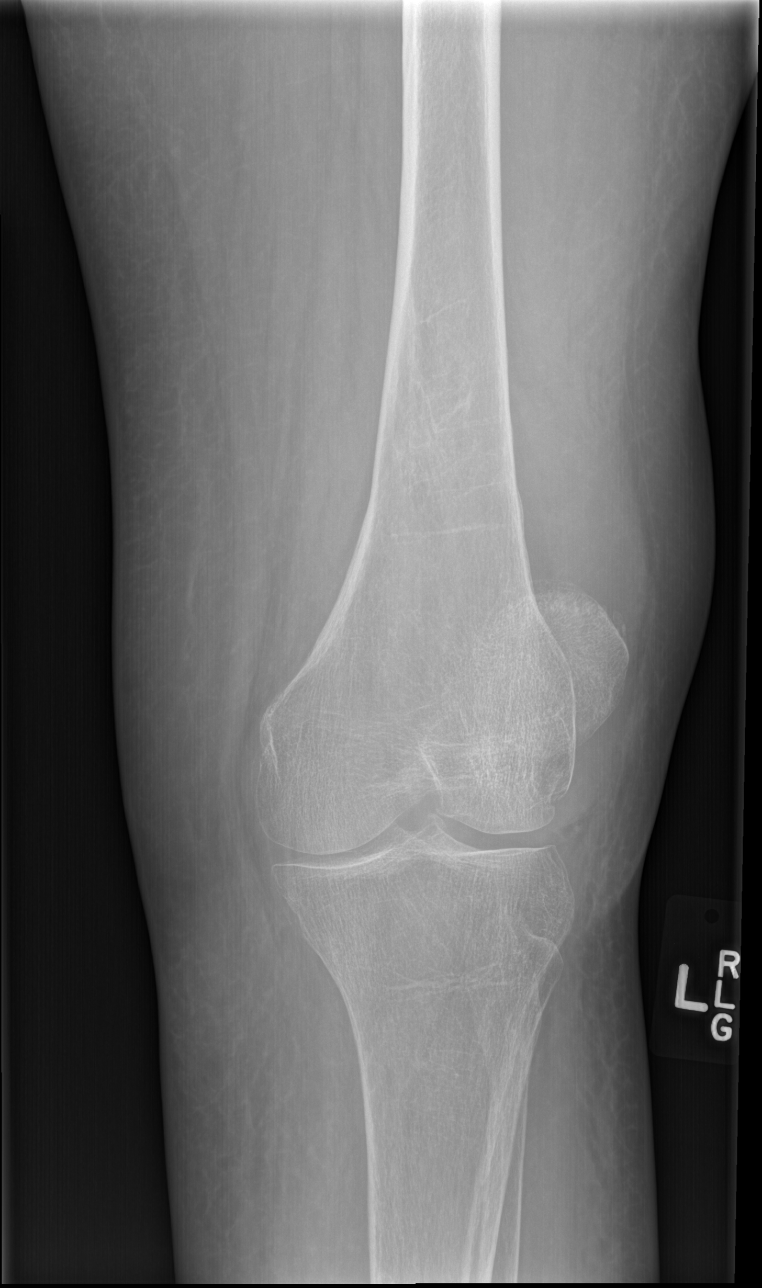

[x knee ap left (4 of 4)]
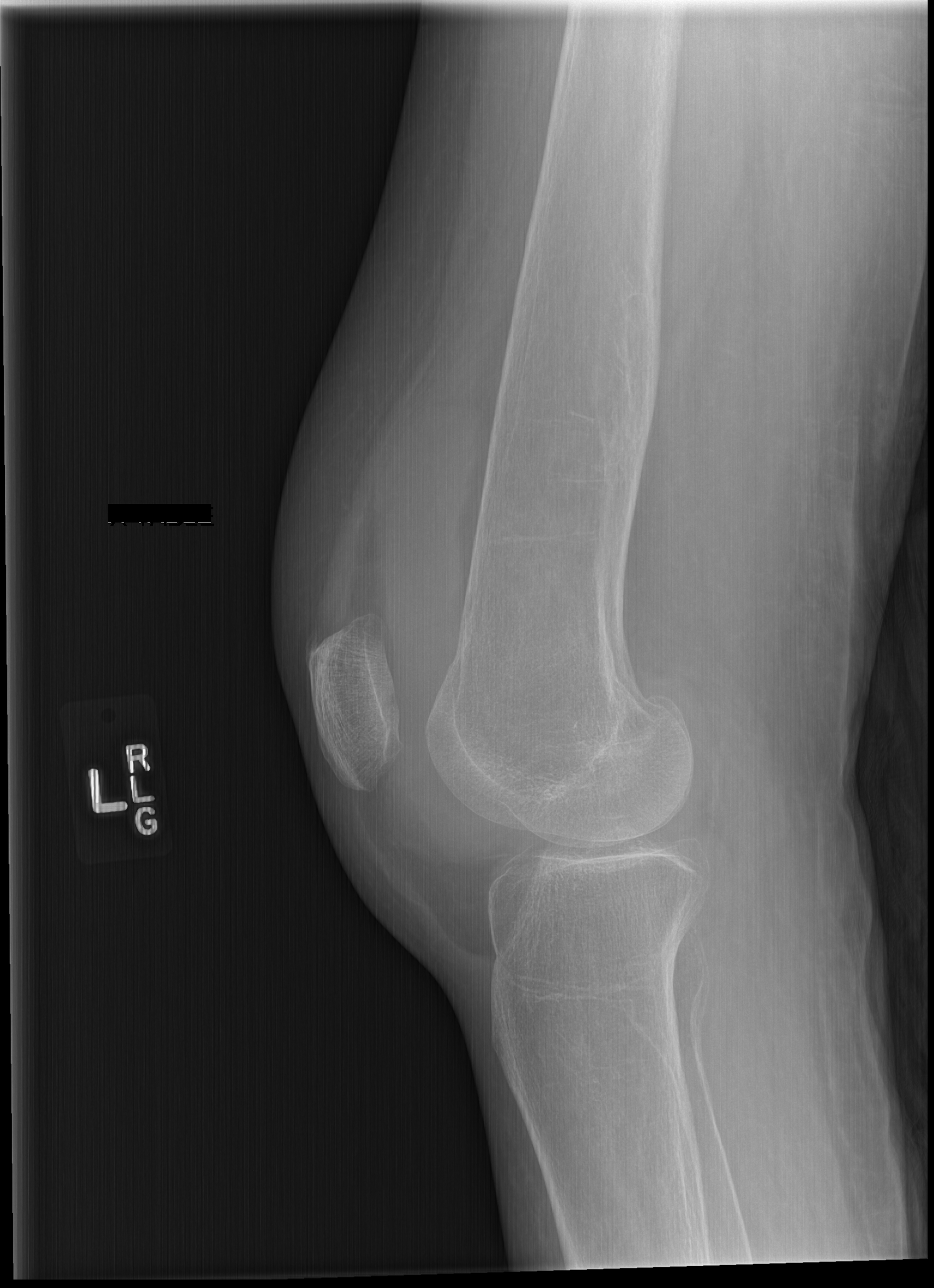

[4 of 4 positions shown; findings below may reference images not displayed]

FINDINGS: There is fluid fluid level in the knee joint. There is question
cortical discontinuity in the lateral aspect of the distal femur.
There is generalized osteopenia of the visualized bones.
IMPRESSION: Fluid fluid level of the knee joint and question cortical
discontinuity in the lateral distal aspect of the femur suggesting
subtle fracture.

## 2016-09-01 IMAGING — CT CT KNEE*L* W/O CM
4 of 8 series · 10 of 33 positions shown, 11 images · non-contrast
Comparison: Radiographs dated 08/19/2015

CLINICAL DATA: Left knee pain and swelling.  Abnormal radiograph.

EXAM:
CT OF THE LEFT KNEE WITHOUT CONTRAST
TECHNIQUE: Multidetector CT imaging of the left knee was performed according to
the standard protocol. Multiplanar CT image reconstructions were
also generated.

[Series 4: knee st · axial · 0.31mm/px · z∈[-1090,-1000]mm · 2 of 136 slices shown, 3 images]
[im 46/136  soft-tissue]
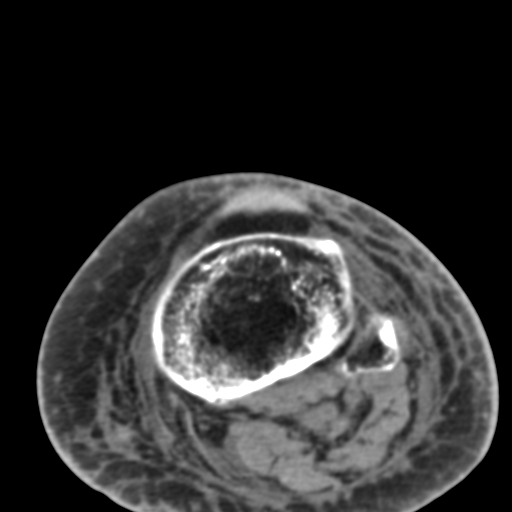
[im 46/136  bone]
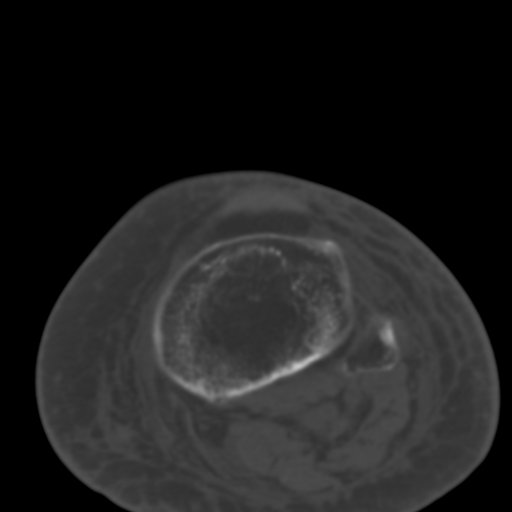
[im 91/136  bone]
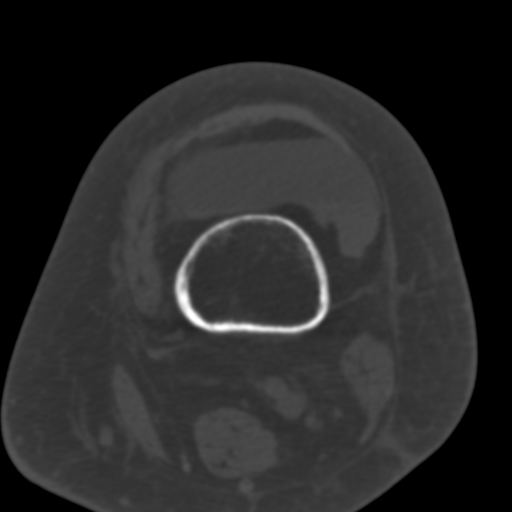

[Series 7: coronal bone · coronal · 0.31mm/px · 1 of 76 slices shown]
[im 38/76  bone]
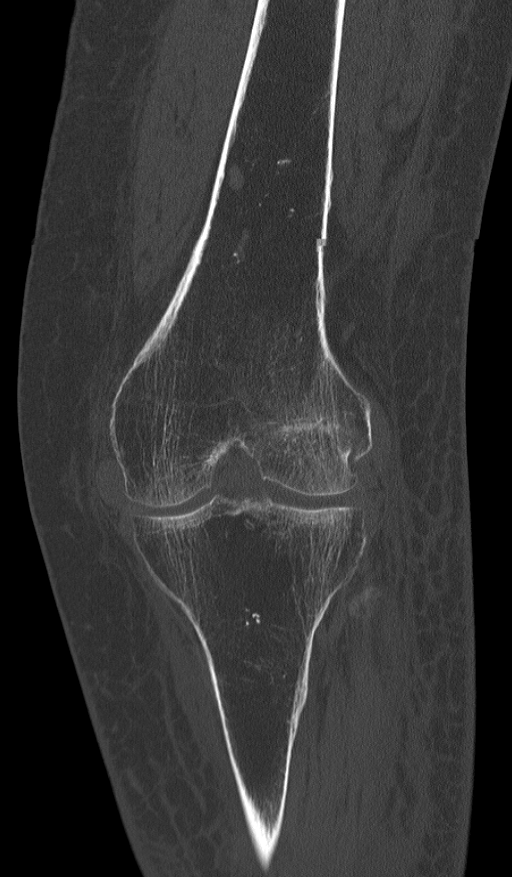

[Series 8: sagittal bone · sagittal · 0.29mm/px · 5 of 76 slices shown]
[im 13/76  bone]
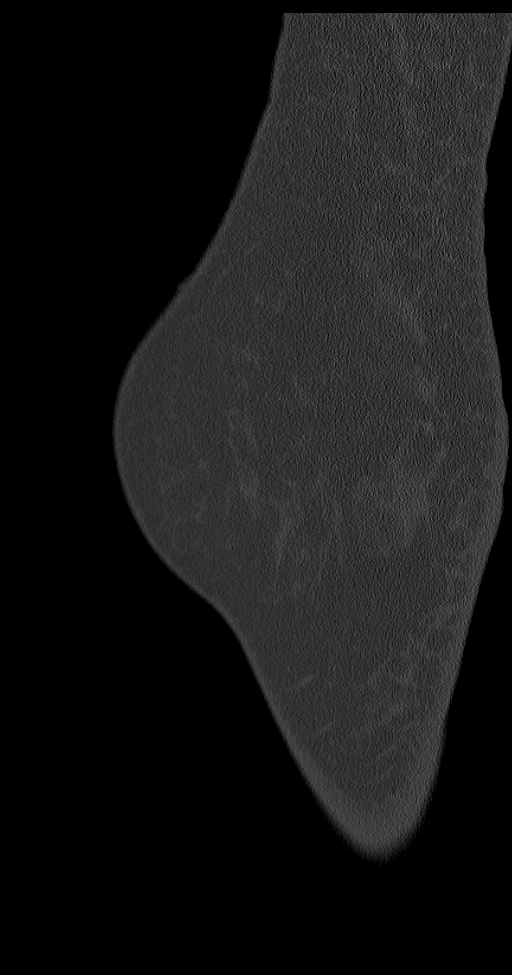
[im 26/76  bone]
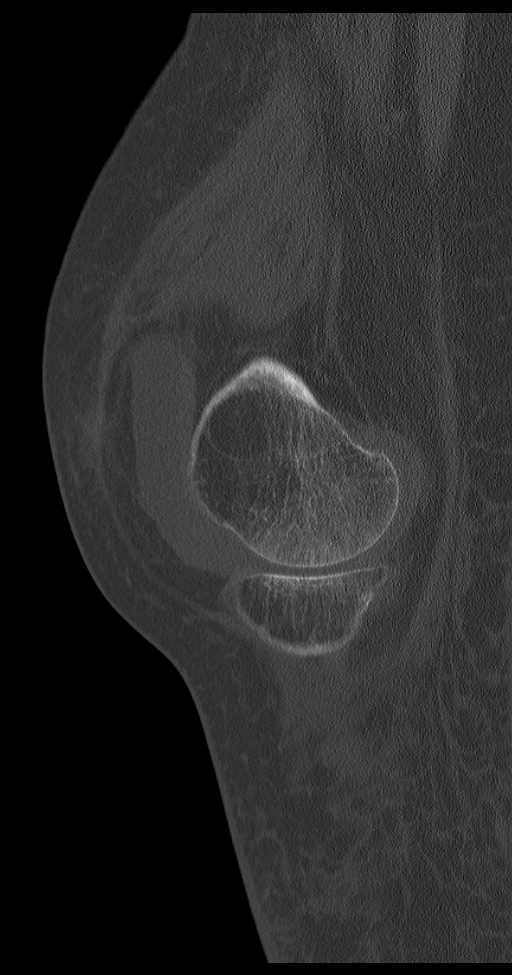
[im 38/76  bone]
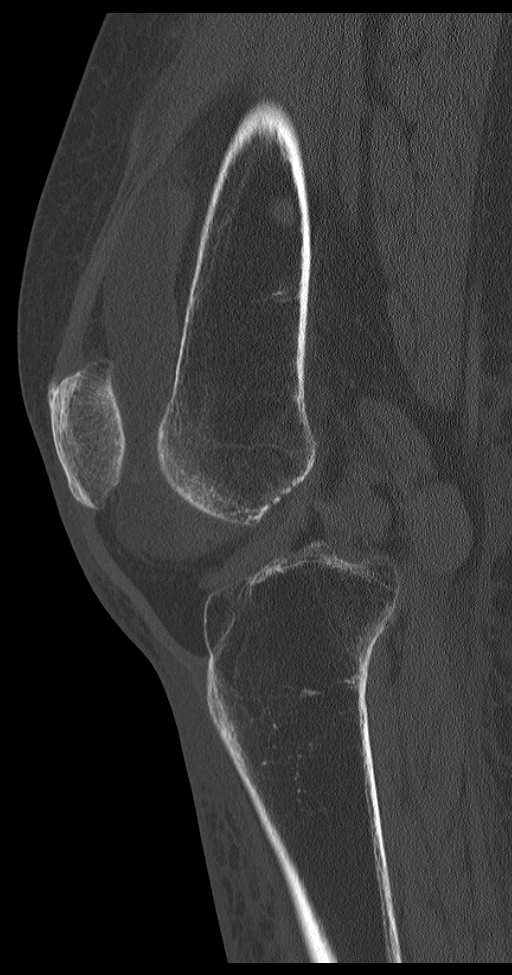
[im 51/76  bone]
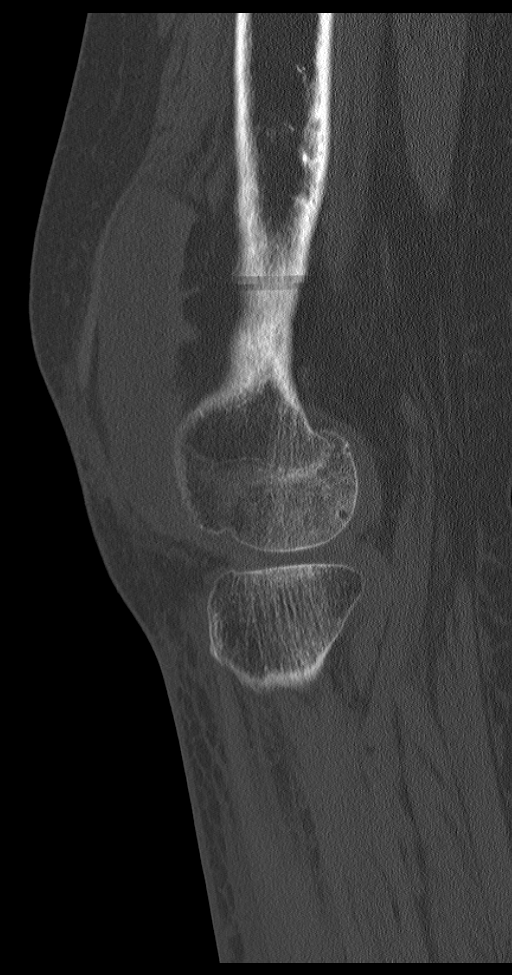
[im 63/76  bone]
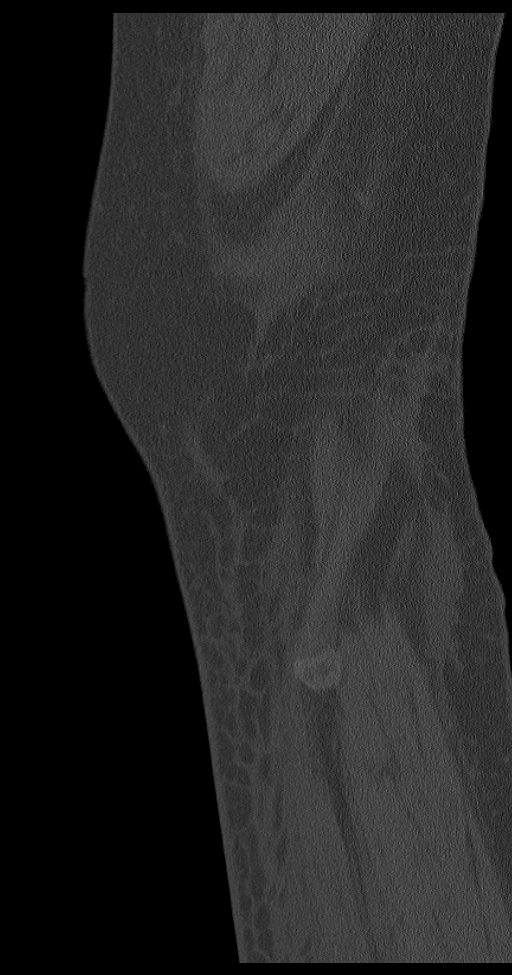

[Series 9: axial bone · axial · 0.28mm/px · z∈[-1101,-1011]mm · 2 of 135 slices shown]
[im 45/135  bone]
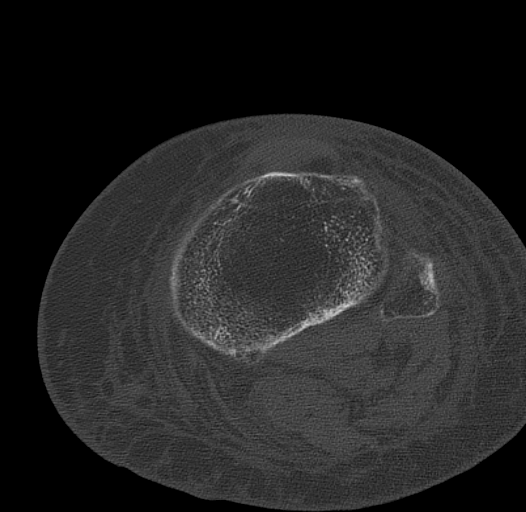
[im 90/135  bone]
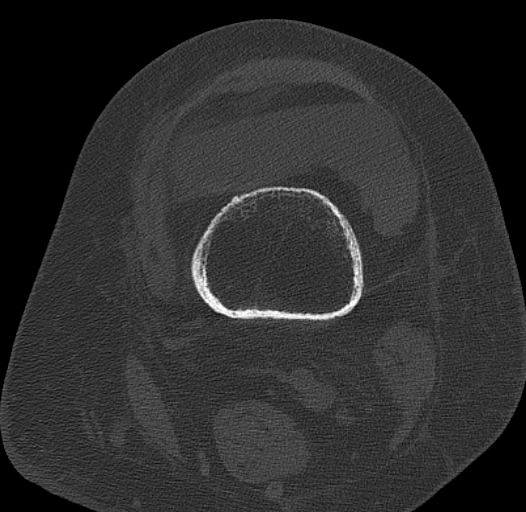

[10 of 33 positions shown; findings below may reference images not displayed]

FINDINGS: There is subtle fracture of the lateral aspect of the lateral
femoral condyle including an avulsion of bone just above the
popliteus tendon origin. There is no fracture of the articular
surface of the femur. The tibia and fibula are intact. All the bones
are markedly osteoid P knee. There is a large hemarthrosis. Patella
is intact. Cruciate and collateral ligaments are intact. Distal
quadriceps tendon and patellar tendon are normal.
IMPRESSION: Subtle avulsion fracture and impaction fracture of the lateral
femoral condyle. The articular surface is not involved.

Prominent hemarthrosis.

Prominent osteopenia.

## 2016-09-05 ENCOUNTER — Other Ambulatory Visit (INDEPENDENT_AMBULATORY_CARE_PROVIDER_SITE_OTHER): Payer: Self-pay | Admitting: Internal Medicine

## 2016-09-09 ENCOUNTER — Encounter (HOSPITAL_COMMUNITY)
Admission: RE | Admit: 2016-09-09 | Discharge: 2016-09-09 | Disposition: A | Discharge status: Home / Self Care | Payer: Commercial Managed Care - HMO | Source: Ambulatory Visit | Attending: Internal Medicine | Admitting: Internal Medicine

## 2016-09-10 ENCOUNTER — Telehealth (INDEPENDENT_AMBULATORY_CARE_PROVIDER_SITE_OTHER): Payer: Self-pay | Admitting: *Deleted

## 2016-09-10 NOTE — Telephone Encounter (Signed)
Patient states that on last Wednesday she got the virus with N&V, Diarrhea and fever. She says that she needs her Zofran and Phenergan refilled. She admits that she is early on this request but says that during this time she took extra to help with her symptoms. Per Dr.Rehman may refill as follows: Zofran 4 mg - patient takes 1 by mouth every 8 hours prn N&V # 30 no refill and this must last the patient for 30 days.  Phenergan 25 mg - the patient is to take 1 by mouth BID prn N&V #20 no refill.  Patient states that she takes only 1 Zofran and will take a Phenergan when she needs it. The Phenergan helps her not to use the Zofran as much when needed.  This was called to the patient 's [place].

## 2016-09-16 ENCOUNTER — Encounter (HOSPITAL_COMMUNITY)
Admission: RE | Admit: 2016-09-16 | Discharge: 2016-09-16 | Disposition: A | Discharge status: Home / Self Care | Payer: Commercial Managed Care - HMO | Source: Ambulatory Visit | Attending: Internal Medicine | Admitting: Internal Medicine

## 2016-09-16 DIAGNOSIS — K5090 Crohn's disease, unspecified, without complications: Secondary | ICD-10-CM | POA: Diagnosis not present

## 2016-09-16 MED ORDER — ACETAMINOPHEN 325 MG PO TABS
650.0000 mg | ORAL_TABLET | Freq: Once | ORAL | Status: AC
  Administered 2016-09-16: 650 mg via ORAL
  Filled 2016-09-16: qty 2

## 2016-09-16 MED ORDER — SODIUM CHLORIDE 0.9 % IV SOLN
INTRAVENOUS | Status: DC
  Administered 2016-09-16: 250 mL via INTRAVENOUS

## 2016-09-16 MED ORDER — SODIUM CHLORIDE 0.9 % IV SOLN
5.0000 mg/kg | Freq: Once | INTRAVENOUS | Status: AC
  Administered 2016-09-16: 300 mg via INTRAVENOUS
  Filled 2016-09-16: qty 30

## 2016-09-16 MED ORDER — LORATADINE 10 MG PO TABS
10.0000 mg | ORAL_TABLET | Freq: Once | ORAL | Status: AC
  Administered 2016-09-16: 10 mg via ORAL
  Filled 2016-09-16: qty 1

## 2016-09-17 ENCOUNTER — Other Ambulatory Visit (INDEPENDENT_AMBULATORY_CARE_PROVIDER_SITE_OTHER): Payer: Self-pay | Admitting: Internal Medicine

## 2016-09-25 ENCOUNTER — Other Ambulatory Visit (INDEPENDENT_AMBULATORY_CARE_PROVIDER_SITE_OTHER): Payer: Self-pay | Admitting: Internal Medicine

## 2016-09-26 ENCOUNTER — Other Ambulatory Visit (INDEPENDENT_AMBULATORY_CARE_PROVIDER_SITE_OTHER): Payer: Self-pay | Admitting: Internal Medicine

## 2016-09-30 ENCOUNTER — Encounter (INDEPENDENT_AMBULATORY_CARE_PROVIDER_SITE_OTHER): Payer: Self-pay

## 2016-10-06 ENCOUNTER — Encounter (INDEPENDENT_AMBULATORY_CARE_PROVIDER_SITE_OTHER): Payer: Self-pay | Admitting: *Deleted

## 2016-10-06 ENCOUNTER — Encounter (INDEPENDENT_AMBULATORY_CARE_PROVIDER_SITE_OTHER): Payer: Self-pay | Admitting: Internal Medicine

## 2016-10-06 ENCOUNTER — Ambulatory Visit (HOSPITAL_COMMUNITY)
Admission: RE | Admit: 2016-10-06 | Discharge: 2016-10-06 | Disposition: A | Discharge status: Home / Self Care | Payer: Commercial Managed Care - HMO | Source: Ambulatory Visit | Attending: Internal Medicine | Admitting: Internal Medicine

## 2016-10-06 ENCOUNTER — Ambulatory Visit (INDEPENDENT_AMBULATORY_CARE_PROVIDER_SITE_OTHER): Payer: Commercial Managed Care - HMO | Admitting: Internal Medicine

## 2016-10-06 VITALS — BP 130/80 | HR 76 | Temp 97.3°F | Resp 18 | Ht 64.4 in | Wt 159.6 lb

## 2016-10-06 DIAGNOSIS — K219 Gastro-esophageal reflux disease without esophagitis: Secondary | ICD-10-CM

## 2016-10-06 DIAGNOSIS — R062 Wheezing: Secondary | ICD-10-CM | POA: Diagnosis not present

## 2016-10-06 DIAGNOSIS — R1319 Other dysphagia: Secondary | ICD-10-CM

## 2016-10-06 DIAGNOSIS — R1310 Dysphagia, unspecified: Secondary | ICD-10-CM | POA: Diagnosis not present

## 2016-10-06 DIAGNOSIS — R0602 Shortness of breath: Secondary | ICD-10-CM | POA: Diagnosis not present

## 2016-10-06 DIAGNOSIS — K449 Diaphragmatic hernia without obstruction or gangrene: Secondary | ICD-10-CM | POA: Diagnosis not present

## 2016-10-06 DIAGNOSIS — K50813 Crohn's disease of both small and large intestine with fistula: Secondary | ICD-10-CM

## 2016-10-06 DIAGNOSIS — D509 Iron deficiency anemia, unspecified: Secondary | ICD-10-CM

## 2016-10-06 DIAGNOSIS — M8580 Other specified disorders of bone density and structure, unspecified site: Secondary | ICD-10-CM | POA: Insufficient documentation

## 2016-10-06 NOTE — Progress Notes (Signed)
Presenting complaint;  Follow-up for Crohn's disease GERD iron deficiency anemia. Patient complains of wheezing.  Subjective:  Danielle [name] is 70 year old African-American female who is here for scheduled visit. She states she has been wheezing for the last 2 days. She has dry cough. She denies fever. She has noted dyspnea with exertion. She denies orthopnea. She has been using albuterol inhaler which has helped. Says heartburns well controlled with therapy however she is complaining of dysphagia to solids. She said this started few weeks ago. She has good appetite. She has gained 10 pounds since her last visit. She is taking Lasix on as-needed basis. She is having 3 formed stools daily. She denies melena or rectal bleeding or rectal discharge. Last dose of Remicade on infliximab was on 09/11/2016. She says she had a temp of 1032 weeks ago with nausea and diarrhea but she was better within 24 hours. She feels she had food poisoning. She did not experience wheezing at that time. She does not have any pets at home.    Current Medications: Outpatient Encounter Prescriptions as of 10/06/2016  Medication Sig  . acetaminophen (TYLENOL) 500 MG tablet Take 1,000 mg by mouth every 6 (six) hours as needed for mild pain or moderate pain.  [name] albuterol (PROVENTIL HFA;VENTOLIN HFA) 108 (90 Base) MCG/ACT inhaler Inhale 2 puffs into the lungs every 6 (six) hours as needed for wheezing.  . Calcium Carbonate-Vitamin D (CALCIUM + D PO) Take 1 tablet by mouth daily.  . Cyanocobalamin (VITAMIN B-12 IJ) Inject 1 each as directed every 30 (thirty) days. Around the 1st week of each month  . dicyclomine (BENTYL) 10 MG/5ML syrup TAKE 2 TEASPOONFULS BY MOUTH 3 TIMES A DAY BEFORE MEALS  . diphenhydrAMINE (BENADRYL) 25 mg capsule Take 25 mg by mouth every 6 (six) hours as needed for itching.  . diphenoxylate-atropine (LOMOTIL) 2.5-0.025 MG tablet Take 1 tablet by mouth as needed.   . fluconazole (DIFLUCAN) 150 MG tablet TAKE 1  TABLET BY MOUTH NOW, THEN 7 DAYS LATER  . FLUZONE HIGH-DOSE 0.5 ML SUSY 0.5 mLs.   . furosemide (LASIX) 40 MG tablet Take 1 tablet (40 mg total) by mouth daily as needed for edema.  [name] HYDROcodone-acetaminophen (NORCO) 7.5-325 MG per tablet Take 1 tablet by mouth every 4 (four) hours as needed for moderate pain.   [name] inFLIXimab (REMICADE) 100 MG injection Inject 100 mg into the vein every 6 (six) weeks.  . Multiple Vitamin (MULTIVITAMIN WITH MINERALS) TABS Take 1 tablet by mouth daily.  [name] nystatin cream (MYCOSTATIN) Apply 1 application topically 2 (two) times daily as needed for dry skin.  . NYSTATIN powder USE TO AFFECTED AREA TWICE DAILY  . ondansetron (ZOFRAN) 4 MG tablet Take 1 tablet (4 mg total) by mouth as needed for nausea or vomiting. Must 30 days before next refill  . pantoprazole (PROTONIX) 40 MG tablet TAKE 1 TABLET (40 MG TOTAL) BY MOUTH 2 (TWO) TIMES DAILY BEFORE A MEAL.  [name] potassium chloride SA (K-DUR,KLOR-CON) 20 MEQ tablet Take 1 tablet (20 mEq total) by mouth daily as needed. Take potassium on days U take furosemide.  . predniSONE (DELTASONE) 20 MG tablet Take 1 tablet (20 mg total) by mouth as directed.  . predniSONE (DELTASONE) 20 MG tablet TAKE 1/2 TABLET BY MOUTH EVERY DAY WITH BREAKFAST  . spironolactone (ALDACTONE) 50 MG tablet Take 1 tablet (50 mg total) by mouth daily.  [name] triamcinolone cream (KENALOG) 0.1 % Apply 1 application topically 2 (two) times daily as needed.  [name]  Vitamin D, Ergocalciferol, (DRISDOL) 50000 units CAPS capsule Take 1 capsule (50,000 Units total) by mouth every 7 (seven) days.  . [DISCONTINUED] ciprofloxacin (CIPRO) 500 MG tablet TAKE 1 TABLET BY MOUTH TWICE A DAY (Patient not taking: Reported on 10/06/2016)  . [DISCONTINUED] diphenoxylate-atropine (LOMOTIL) 2.5-0.025 MG tablet TAKE 2 TABLETS BY MOUTH TWICE A DAY (Patient not taking: Reported on 10/06/2016)  . [DISCONTINUED] megestrol (MEGACE) 40 MG tablet Take 2 tablets (80 mg total) by mouth daily.    No facility-administered encounter medications on file as of 10/06/2016.      Objective: Blood pressure 130/80, pulse 76, temperature 97.3 F (36.3 C), temperature source Oral, resp. rate 18, height 5' 4.4" (1.636 m), weight 159 lb 9.6 oz (72.4 kg). Patient is alert and in no acute distress. Conjunctiva is pink. Sclera is nonicteric Oropharyngeal mucosa is normal. No neck masses or thyromegaly noted. Cardiac exam with regular rhythm normal S1 and S2. No murmur or gallop noted. She has bilateral expiratory rhonchi. Abdomen is symmetrical soft and nontender without organomegaly or masses. She has 2+ pitting edema involving both legs.  Labs/studies Results: Lab data from 04/15/2016  H&H 10 and 31.9   Serum albumin 3.5. Serum potassium was 4.1. BUN 21 and creatinine 1.25.   Assessment:  #1. Crohn's disease of small and large bowel. She is on infliximab and appears to be in remission. #2. GERD. Heartburn is well controlled with therapy #3. Esophageal dysphagia. She has history of Schatzki's ring. Last EGD ED was over 4 years ago. She will be further evaluated with barium pill esophagogram the for EGD considered. #4. Iron deficiency anemia. #5. Bronchospasm. She is afebrile. She has lower extremity edema but she does not have elevated JVD. Bronchospasm may be due to allergy. Doubt that she has pneumonia. She will go to the hospital for chest x-ray.   Plan:  Patient will go to the hospital for chest PA and left lateral films. Patient advised to use albuterol inhaler up to 4 times a day. She will have CBC and metabolic 7. Barium pill esophagogram. Patient advised to take furosemide for the next 2-3 days and thereafter on when necessary basis. Office visit in 3 months.

## 2016-10-06 NOTE — Patient Instructions (Signed)
Use an inhaler up to 4 times a day. Physician will call with results of chest x-ray esophagogram and blood work

## 2016-10-09 ENCOUNTER — Telehealth (INDEPENDENT_AMBULATORY_CARE_PROVIDER_SITE_OTHER): Payer: Self-pay | Admitting: *Deleted

## 2016-10-09 NOTE — Telephone Encounter (Signed)
Patient called ask that Dr.Rehman call in a RX for her Sinus. Patient was seen and assessed in office on 10/06/2016. Per Dr.Rehman - may call in Augmentin 500mg /125mg  take 1 by mouth three times daily for 1 week. This was called to the [facility]. Patient was called and made aware,also she was reminded of her lab work , that it needed to be done.

## 2016-10-13 ENCOUNTER — Ambulatory Visit (INDEPENDENT_AMBULATORY_CARE_PROVIDER_SITE_OTHER): Payer: Commercial Managed Care - HMO | Admitting: Internal Medicine

## 2016-10-13 ENCOUNTER — Ambulatory Visit (HOSPITAL_COMMUNITY): Payer: Commercial Managed Care - HMO

## 2016-10-14 DIAGNOSIS — D509 Iron deficiency anemia, unspecified: Secondary | ICD-10-CM | POA: Diagnosis not present

## 2016-10-14 DIAGNOSIS — K50813 Crohn's disease of both small and large intestine with fistula: Secondary | ICD-10-CM | POA: Diagnosis not present

## 2016-10-14 LAB — BASIC METABOLIC PANEL
BUN: 9 mg/dL (ref 7–25)
CALCIUM: 8.2 mg/dL — AB (ref 8.6–10.4)
CHLORIDE: 114 mmol/L — AB (ref 98–110)
CO2: 18 mmol/L — AB (ref 20–31)
CREATININE: 1.1 mg/dL — AB (ref 0.50–0.99)
GLUCOSE: 76 mg/dL (ref 65–99)
Potassium: 3.5 mmol/L (ref 3.5–5.3)
Sodium: 140 mmol/L (ref 135–146)

## 2016-10-14 LAB — CBC
HEMATOCRIT: 31.7 % — AB (ref 35.0–45.0)
HEMOGLOBIN: 9.8 g/dL — AB (ref 11.7–15.5)
MCH: 25.6 pg — AB (ref 27.0–33.0)
MCHC: 30.9 g/dL — ABNORMAL LOW (ref 32.0–36.0)
MCV: 82.8 fL (ref 80.0–100.0)
MPV: 8.7 fL (ref 7.5–12.5)
PLATELETS: 329 10*3/uL (ref 140–400)
RBC: 3.83 MIL/uL (ref 3.80–5.10)
RDW: 16.4 % — ABNORMAL HIGH (ref 11.0–15.0)
WBC: 11.5 10*3/uL — ABNORMAL HIGH (ref 3.8–10.8)

## 2016-10-15 ENCOUNTER — Ambulatory Visit (HOSPITAL_COMMUNITY): Payer: Commercial Managed Care - HMO

## 2016-10-16 ENCOUNTER — Encounter (HOSPITAL_COMMUNITY): Payer: Self-pay | Admitting: Emergency Medicine

## 2016-10-16 ENCOUNTER — Emergency Department (HOSPITAL_COMMUNITY)
Admission: EM | Admit: 2016-10-16 | Discharge: 2016-10-16 | Disposition: A | Discharge status: Home / Self Care | Payer: Commercial Managed Care - HMO | Attending: Emergency Medicine | Admitting: Emergency Medicine

## 2016-10-16 ENCOUNTER — Emergency Department (HOSPITAL_COMMUNITY): Payer: Commercial Managed Care - HMO

## 2016-10-16 DIAGNOSIS — N183 Chronic kidney disease, stage 3 (moderate): Secondary | ICD-10-CM | POA: Diagnosis not present

## 2016-10-16 DIAGNOSIS — Z87891 Personal history of nicotine dependence: Secondary | ICD-10-CM | POA: Diagnosis not present

## 2016-10-16 DIAGNOSIS — R0789 Other chest pain: Secondary | ICD-10-CM

## 2016-10-16 DIAGNOSIS — Z79899 Other long term (current) drug therapy: Secondary | ICD-10-CM | POA: Diagnosis not present

## 2016-10-16 DIAGNOSIS — J40 Bronchitis, not specified as acute or chronic: Secondary | ICD-10-CM | POA: Diagnosis not present

## 2016-10-16 DIAGNOSIS — R079 Chest pain, unspecified: Secondary | ICD-10-CM | POA: Diagnosis not present

## 2016-10-16 HISTORY — DX: Crohn's disease, unspecified, without complications: K50.90

## 2016-10-16 LAB — BASIC METABOLIC PANEL
Anion gap: 7 (ref 5–15)
BUN: 11 mg/dL (ref 6–20)
CHLORIDE: 114 mmol/L — AB (ref 101–111)
CO2: 18 mmol/L — ABNORMAL LOW (ref 22–32)
CREATININE: 1.16 mg/dL — AB (ref 0.44–1.00)
Calcium: 8.6 mg/dL — ABNORMAL LOW (ref 8.9–10.3)
GFR calc Af Amer: 55 mL/min — ABNORMAL LOW (ref >60)
GFR calc non Af Amer: 47 mL/min — ABNORMAL LOW (ref >60)
GLUCOSE: 69 mg/dL (ref 65–99)
POTASSIUM: 3.4 mmol/L — AB (ref 3.5–5.1)
SODIUM: 139 mmol/L (ref 135–145)

## 2016-10-16 LAB — CBC
HCT: 33.7 % — ABNORMAL LOW (ref 36.0–46.0)
HEMOGLOBIN: 10.2 g/dL — AB (ref 12.0–15.0)
MCH: 25.2 pg — AB (ref 26.0–34.0)
MCHC: 30.3 g/dL (ref 30.0–36.0)
MCV: 83.2 fL (ref 78.0–100.0)
Platelets: 294 10*3/uL (ref 150–400)
RBC: 4.05 MIL/uL (ref 3.87–5.11)
RDW: 16.4 % — ABNORMAL HIGH (ref 11.5–15.5)
WBC: 21.1 10*3/uL — ABNORMAL HIGH (ref 4.0–10.5)

## 2016-10-16 LAB — I-STAT TROPONIN, ED: Troponin i, poc: 0 ng/mL (ref 0.00–0.08)

## 2016-10-16 MED ORDER — HYDROCODONE-ACETAMINOPHEN 7.5-325 MG PO TABS: 1.0000 | ORAL_TABLET | ORAL | 0 refills | Status: DC | PRN

## 2016-10-16 MED ORDER — DOXYCYCLINE MONOHYDRATE 100 MG PO CAPS: 100.0000 mg | ORAL_CAPSULE | Freq: Two times a day (BID) | ORAL | 0 refills | Status: DC

## 2016-10-16 MED ORDER — HYDROCODONE-ACETAMINOPHEN 5-325 MG PO TABS
2.0000 | ORAL_TABLET | ORAL | Status: AC
  Administered 2016-10-16: 2 via ORAL
  Filled 2016-10-16: qty 2

## 2016-10-16 MED ORDER — HYDROCODONE-ACETAMINOPHEN 5-325 MG PO TABS: 1.0000 | ORAL_TABLET | ORAL | Status: DC

## 2016-10-16 NOTE — ED Notes (Signed)
MD at bedside. 

## 2016-10-16 NOTE — ED Notes (Signed)
Bed: [id number] Expected date: 10/16/16 Expected time: 1:18 PM Means of arrival: Ambulance Comments: Chest wall/back pain after putting on stockings this morning

## 2016-10-16 NOTE — ED Provider Notes (Signed)
[place] DEPT Provider Note   CSN: [id number] Arrival date & time: 10/16/16  1325     History   Chief Complaint Chief Complaint  Patient presents with  . Chest Pain  . Diarrhea    HPI LARCENIA BUDMAN is a 70 y.o. female.  HPI Pt was bending over to put on stockings the other day.  When she did she felt a pop on the right side of her chest.  Since then she has been having pain on the right side of her chest and into her back.  It increases with deep breathing.  She has had some cough.  No fevers.  No shortness of breath.  No legs swelling noticed in the last few days.  The sx seem to getting worse so she came to the ED today.  Pt does not have a history of heart or lung problems.  Past Medical History:  Diagnosis Date  . Crohn disease ([place])   . Hiatal hernia 12/07/2015  . Psoriasis     Patient Active Problem List   Diagnosis Date Noted  . Immunosuppressed status ([place]) 12/09/2015  . 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 Hiatal hernia 12/07/2015  . Sepsis due to pneumonia ([place]) 12/06/2015  . CKD (chronic kidney disease) stage 3, GFR 30-59 ml/min 12/06/2015  . Crohn's disease with fistula ([place])   . Hypomagnesemia   . ARF (acute renal failure) ([place]) 11/18/2014  . Leukocytosis 11/06/2011    Past Surgical History:  Procedure Laterality Date  . ANAL STENOSIS  ~ 2010  . APPENDECTOMY  1980's  . BOWEL RESECTION  2009  . CHOLECYSTECTOMY  1990's  . COLONOSCOPY  2013  . COLOSTOMY  2009   DEMASO  . COLOSTOMY TAKEDOWN  2009   "only had it for about 1 month" (08/22/2013)  . ESOPHAGOGASTRODUODENOSCOPY  11/11/2011   Procedure: ESOPHAGOGASTRODUODENOSCOPY (EGD);  Surgeon: Missy Sabins, MD;  Location: [facility] ENDOSCOPY;  Service: Endoscopy;  Laterality: N/A;  . ESOPHAGOGASTRODUODENOSCOPY  05/18/2012   Procedure: ESOPHAGOGASTRODUODENOSCOPY (EGD);  Surgeon: Rogene Houston, MD;  Location: [facility] ENDO SUITE;  Service: Endoscopy;  Laterality: N/A;  200  . FLEXIBLE SIGMOIDOSCOPY  11/11/2011   Procedure: FLEXIBLE SIGMOIDOSCOPY;  Surgeon: Missy Sabins, MD;  Location: [place];  Service: Endoscopy;  Laterality: N/A;  . goiter  1999  . LEFT HEART CATHETERIZATION WITH CORONARY ANGIOGRAM N/A 07/09/2014   Procedure: LEFT HEART CATHETERIZATION WITH CORONARY ANGIOGRAM;  Surgeon: Peter M Martinique, MD;  Location: [facility] CATH LAB;  Service: Cardiovascular;  Laterality: N/A;  . PROCTOSCOPY N/A 01/31/2015   Procedure: RIGID PROCTOSCOPY;  Surgeon: Leighton Ruff, MD;  Location: [place];  Service: General;  Laterality: N/A;  . RECTAL EXAM UNDER ANESTHESIA N/A 01/31/2015   Procedure: RECTAL EXAM UNDER ANESTHESIA WITH PLACEMENT OF A SETON;  Surgeon: Leighton Ruff, MD;  Location: [place];  Service: General;  Laterality: N/A;    OB History    No data available       Home Medications    Prior to Admission medications   Medication Sig Start Date End Date Taking? Authorizing Provider  acetaminophen (TYLENOL) 500 MG tablet Take 1,000 mg by mouth every 6 (six) hours as needed for mild pain or moderate pain.   Yes Historical Provider, MD  albuterol (PROVENTIL HFA;VENTOLIN HFA) 108 (90 Base) MCG/ACT inhaler Inhale 2 puffs into the lungs every 6 (six) hours as needed for wheezing. 12/09/15  Yes Venetia Maxon Rama, MD  Calcium Carbonate-Vitamin D (CALCIUM + D PO) Take 1 tablet by  mouth daily.   Yes Historical Provider, MD  Cyanocobalamin (VITAMIN B-12 IJ) Inject 1 each as directed every 30 (thirty) days. Around the 1st week of each month   Yes Historical Provider, MD  dicyclomine (BENTYL) 10 MG/5ML syrup TAKE 2 TEASPOONFULS BY MOUTH 3 TIMES A DAY BEFORE MEALS 09/07/16  Yes Butch Penny, NP  Multiple Vitamin (MULTIVITAMIN WITH MINERALS) TABS Take 1 tablet by mouth daily.   Yes Historical Provider, MD  NYSTATIN powder USE TO AFFECTED AREA TWICE DAILY 04/13/16  Yes Rogene Houston, MD  ondansetron (ZOFRAN) 4 MG tablet Take 1 tablet (4 mg total) by mouth as needed for nausea or vomiting. Must 30 days before next refill 03/13/16   Yes Terri L Setzer, NP  pantoprazole (PROTONIX) 40 MG tablet TAKE 1 TABLET (40 MG TOTAL) BY MOUTH 2 (TWO) TIMES DAILY BEFORE A MEAL. 05/25/16  Yes Butch Penny, NP  promethazine (PHENERGAN) 25 MG tablet Take 25 mg by mouth every 6 (six) hours as needed for nausea.  09/10/16  Yes Historical Provider, MD  spironolactone (ALDACTONE) 50 MG tablet Take 1 tablet (50 mg total) by mouth daily. 12/09/15  Yes Venetia Maxon Rama, MD  Vitamin D, Ergocalciferol, (DRISDOL) 50000 units CAPS capsule Take 1 capsule (50,000 Units total) by mouth every 7 (seven) days. 07/19/16  Yes Rogene Houston, MD  diphenhydrAMINE (BENADRYL) 25 mg capsule Take 25 mg by mouth every 6 (six) hours as needed for itching. 03/22/14   Sheila Oats, MD  diphenoxylate-atropine (LOMOTIL) 2.5-0.025 MG tablet Take 1 tablet by mouth as needed for diarrhea or loose stools.  09/25/16   Historical Provider, MD  doxycycline (MONODOX) 100 MG capsule Take 1 capsule (100 mg total) by mouth 2 (two) times daily. 10/16/16   Dorie Rank, MD  fluconazole (DIFLUCAN) 150 MG tablet TAKE 1 TABLET BY MOUTH NOW, THEN 7 DAYS LATER Patient not taking: Reported on 10/16/2016 09/17/16   Rogene Houston, MD  FLUZONE HIGH-DOSE 0.5 ML SUSY 0.5 mLs.  08/19/16   Historical Provider, MD  furosemide (LASIX) 40 MG tablet Take 1 tablet (40 mg total) by mouth daily as needed for edema. 06/30/16   Rogene Houston, MD  HYDROcodone-acetaminophen (NORCO) 7.5-325 MG tablet Take 1 tablet by mouth every 4 (four) hours as needed for moderate pain. 10/16/16   Dorie Rank, MD  inFLIXimab (REMICADE) 100 MG injection Inject 100 mg into the vein every 6 (six) weeks.    Historical Provider, MD  nystatin cream (MYCOSTATIN) Apply 1 application topically 2 (two) times daily as needed for dry skin. 03/24/16   Rogene Houston, MD  potassium chloride SA (K-DUR,KLOR-CON) 20 MEQ tablet Take 1 tablet (20 mEq total) by mouth daily as needed. Take potassium on days U take furosemide. Patient taking  differently: Take 20 mEq by mouth daily as needed. Take potassium on days while taking furosemide. 06/30/16   Rogene Houston, MD  predniSONE (DELTASONE) 20 MG tablet Take 1 tablet (20 mg total) by mouth as directed. Patient not taking: Reported on 10/16/2016 09/26/16   Rogene Houston, MD  predniSONE (DELTASONE) 20 MG tablet TAKE 1/2 TABLET BY MOUTH EVERY DAY WITH BREAKFAST Patient not taking: Reported on 10/16/2016 09/28/16   Butch Penny, NP  triamcinolone cream (KENALOG) 0.1 % Apply 1 application topically 2 (two) times daily as needed. 03/24/16   Rogene Houston, MD    Family History Family History  Problem Relation Age of Onset  . Diabetes Mother   .  Heart disease Father   . Healthy Sister   . Hypertension Brother   . Colon cancer Brother     died with colon cancer  . Crohn's disease Paternal Aunt     Social History Social History  Substance Use Topics  . Smoking status: Former Smoker    Packs/day: 0.12    Years: 4.00    Types: Cigarettes    Quit date: 10/05/1996  . Smokeless tobacco: Never Used     Comment: 08/22/2013 1 pack every two weeks when she did smoked  . Alcohol use No     Allergies   Zithromax [azithromycin]; Aspirin; Ibuprofen; and Sulfa antibiotics   Review of Systems Review of Systems  Gastrointestinal: Positive for diarrhea (occasional).  All other systems reviewed and are negative.    Physical Exam Updated Vital Signs BP 109/66   Pulse 87   Temp 98.8 F (37.1 C) (Oral)   Resp 21   SpO2 96%   Physical Exam  Constitutional: She appears well-developed and well-nourished. No distress.  HENT:  Head: Normocephalic and atraumatic.  Right Ear: External ear normal.  Left Ear: External ear normal.  Eyes: Conjunctivae are normal. Right eye exhibits no discharge. Left eye exhibits no discharge. No scleral icterus.  Neck: Neck supple. No tracheal deviation present.  Cardiovascular: Normal rate, regular rhythm and intact distal pulses.     Pulmonary/Chest: Effort normal and breath sounds normal. No stridor. No respiratory distress. She has no wheezes. She has no rales. She exhibits tenderness (anterior and mid axillary left chest wall).  Abdominal: Soft. Bowel sounds are normal. She exhibits no distension. There is no tenderness. There is no rebound and no guarding.  Musculoskeletal: She exhibits no edema or tenderness.  Neurological: She is alert. She has normal strength. No cranial nerve deficit (no facial droop, extraocular movements intact, no slurred speech) or sensory deficit. She exhibits normal muscle tone. She displays no seizure activity. Coordination normal.  Skin: Skin is warm and dry. No rash noted.  Psychiatric: She has a normal mood and affect.  Nursing note and vitals reviewed.    ED Treatments / Results  Labs (all labs ordered are listed, but only abnormal results are displayed) Labs Reviewed  CBC - Abnormal; Notable for the following:       Result Value   WBC 21.1 (*)    Hemoglobin 10.2 (*)    HCT 33.7 (*)    MCH 25.2 (*)    RDW 16.4 (*)    All other components within normal limits  BASIC METABOLIC PANEL - Abnormal; Notable for the following:    Potassium 3.4 (*)    Chloride 114 (*)    CO2 18 (*)    Creatinine, Ser 1.16 (*)    Calcium 8.6 (*)    GFR calc non Af Amer 47 (*)    GFR calc Af Amer 55 (*)    All other components within normal limits  I-STAT TROPOININ, ED    EKG  EKG Interpretation  Date/Time:  Friday October 16 2016 13:46:54 EST Ventricular Rate:  96 PR Interval:    QRS Duration: 104 QT Interval:  364 QTC Calculation: 460 R Axis:   -32 Text Interpretation:  Sinus rhythm Short PR interval Abnormal R-wave progression, late transition Left ventricular hypertrophy Anterior ST elevation, probably due to LVH PACs new since last tracing Confirmed by Nickolis Diel  MD-J, Mandela Bello [id number]) on 10/16/2016 2:35:39 PM       Radiology Dg Chest 2 View  Result Date:  10/16/2016 CLINICAL DATA:   Chest wall pain EXAM: CHEST  2 VIEW COMPARISON:  10/06/2016 FINDINGS: Very large hiatal hernia, not quite as large as on the prior study. Bibasilar atelectasis has progressed in the interval. No pleural effusion. Negative for heart failure. IMPRESSION: Very large hiatal hernia, partially decompressed since the prior chest x-ray Progression of bibasilar atelectasis/ infiltrate.  No effusion. Electronically Signed   By: Franchot Gallo M.D.   On: 10/16/2016 15:11    Procedures Procedures (including critical care time)  Medications Ordered in ED Medications  HYDROcodone-acetaminophen (NORCO/VICODIN) 5-325 MG per tablet 2 tablet (2 tablets Oral Given 10/16/16 1446)     Initial Impression / Assessment and Plan / ED Course  I have reviewed the triage vital signs and the nursing notes.  Pertinent labs & imaging results that were available during my care of the patient were reviewed by me and considered in my medical decision making (see chart for details).  Clinical Course as of Oct 16 1626  Fri Oct 16, 2016  1551 Elevated WBC but she is on steroids  [JK]    Clinical Course User Index [JK] Dorie Rank, MD    Patient's chest x-ray does not show any rib fracture. It does suggest the possibility of bibasilar infiltrate. This could be atelectasis associated with splinting from her chest wall pain however the patient has had some respiratory symptoms the previous week. She finished a recent course of antibiotics. Leukocytosis may very well be from her steroids but I can't certainly exclude an infection. I will have her start doxycycline. She is on chronic opiates and requests 5 hydrocodone pills. I think this is reasonable. I recommended follow-up with her primary care doctor.  Final Clinical Impressions(s) / ED Diagnoses   Final diagnoses:  Chest wall pain  Bronchitis    New Prescriptions New Prescriptions   DOXYCYCLINE (MONODOX) 100 MG CAPSULE    Take 1 capsule (100 mg total) by mouth 2 (two)  times daily.     Dorie Rank, MD 10/16/16 [phone]

## 2016-10-16 NOTE — ED Triage Notes (Addendum)
Pt from home. Pt reports R CP under breast radiates to side since Wednesday. Also having back spasms. Pain began after bending over to put on socks. Felt a pop at pain onset. Worse with movement and palpation. Was recently treated for a cough as well. No SOB, dizziness, nausea, or lightheadedness. Pt also reports generalized weakness for the past week. Has had diarrhea at home. No n/v or dysuria.

## 2016-10-16 NOTE — ED Notes (Signed)
I attempted to collect labs and was unsuccessful. 

## 2016-10-16 NOTE — Discharge Instructions (Signed)
Follow up with your doctor next week to make sure your symptoms are improving.  Take the abx as prescribed for possible lung infection on CXR

## 2016-10-21 ENCOUNTER — Ambulatory Visit (HOSPITAL_COMMUNITY): Payer: Commercial Managed Care - HMO

## 2016-10-26 ENCOUNTER — Other Ambulatory Visit (INDEPENDENT_AMBULATORY_CARE_PROVIDER_SITE_OTHER): Payer: Self-pay | Admitting: Internal Medicine

## 2016-10-28 ENCOUNTER — Encounter (HOSPITAL_COMMUNITY)
Admission: RE | Admit: 2016-10-28 | Discharge: 2016-10-28 | Disposition: A | Discharge status: Home / Self Care | Payer: Commercial Managed Care - HMO | Source: Ambulatory Visit | Attending: Internal Medicine | Admitting: Internal Medicine

## 2016-10-28 DIAGNOSIS — K5090 Crohn's disease, unspecified, without complications: Secondary | ICD-10-CM | POA: Diagnosis not present

## 2016-10-28 MED ORDER — SODIUM CHLORIDE 0.9 % IV SOLN
INTRAVENOUS | Status: DC
  Administered 2016-10-28: 200 mL via INTRAVENOUS

## 2016-10-28 MED ORDER — LORATADINE 10 MG PO TABS
ORAL_TABLET | ORAL | Status: AC
  Filled 2016-10-28: qty 1

## 2016-10-28 MED ORDER — SODIUM CHLORIDE 0.9 % IV SOLN
400.0000 mg | INTRAVENOUS | Status: DC
  Administered 2016-10-28: 400 mg via INTRAVENOUS
  Filled 2016-10-28: qty 40

## 2016-10-28 MED ORDER — ACETAMINOPHEN 325 MG PO TABS
ORAL_TABLET | ORAL | Status: AC
  Filled 2016-10-28: qty 2

## 2016-10-28 MED ORDER — LORATADINE 10 MG PO TABS
10.0000 mg | ORAL_TABLET | Freq: Once | ORAL | Status: AC
  Administered 2016-10-28: 10 mg via ORAL

## 2016-10-28 MED ORDER — ACETAMINOPHEN 325 MG PO TABS
650.0000 mg | ORAL_TABLET | Freq: Once | ORAL | Status: AC
  Administered 2016-10-28: 650 mg via ORAL

## 2016-11-03 DIAGNOSIS — L821 Other seborrheic keratosis: Secondary | ICD-10-CM | POA: Diagnosis not present

## 2016-11-03 DIAGNOSIS — L720 Epidermal cyst: Secondary | ICD-10-CM | POA: Diagnosis not present

## 2016-11-04 DIAGNOSIS — N183 Chronic kidney disease, stage 3 (moderate): Secondary | ICD-10-CM | POA: Diagnosis not present

## 2016-11-04 DIAGNOSIS — G4700 Insomnia, unspecified: Secondary | ICD-10-CM | POA: Diagnosis not present

## 2016-11-04 DIAGNOSIS — D649 Anemia, unspecified: Secondary | ICD-10-CM | POA: Diagnosis not present

## 2016-11-04 DIAGNOSIS — K5000 Crohn's disease of small intestine without complications: Secondary | ICD-10-CM | POA: Diagnosis not present

## 2016-11-05 ENCOUNTER — Telehealth (INDEPENDENT_AMBULATORY_CARE_PROVIDER_SITE_OTHER): Payer: Self-pay | Admitting: *Deleted

## 2016-11-05 NOTE — Telephone Encounter (Signed)
[facility] was called @[phone] , spoke with Marlowe Sax. A PA was completed over the phone for a continuation of Remicade. I was told that it would be 24-72 hours before a decision would be made. The reference # is [id number]. It was asked that it be back dated to 10-28-2016.

## 2016-11-11 ENCOUNTER — Other Ambulatory Visit (INDEPENDENT_AMBULATORY_CARE_PROVIDER_SITE_OTHER): Payer: Self-pay | Admitting: Internal Medicine

## 2016-11-16 ENCOUNTER — Other Ambulatory Visit (INDEPENDENT_AMBULATORY_CARE_PROVIDER_SITE_OTHER): Payer: Self-pay | Admitting: *Deleted

## 2016-11-16 DIAGNOSIS — R197 Diarrhea, unspecified: Secondary | ICD-10-CM

## 2016-11-16 DIAGNOSIS — R21 Rash and other nonspecific skin eruption: Secondary | ICD-10-CM

## 2016-11-16 MED ORDER — NYSTATIN 100000 UNIT/GM EX CREA: 1.0000 "application " | TOPICAL_CREAM | Freq: Two times a day (BID) | CUTANEOUS | 1 refills | Status: DC | PRN

## 2016-11-16 MED ORDER — DIPHENOXYLATE-ATROPINE 2.5-0.025 MG PO TABS: 1.0000 | ORAL_TABLET | Freq: Four times a day (QID) | ORAL | 0 refills | Status: DC | PRN

## 2016-11-17 ENCOUNTER — Telehealth (INDEPENDENT_AMBULATORY_CARE_PROVIDER_SITE_OTHER): Payer: Self-pay | Admitting: *Deleted

## 2016-11-17 NOTE — Telephone Encounter (Signed)
Patient called the office on 12/111/2017. She was c/o horrible diarrhea. This started over the weekend. She has gone to the bathroom form 8 to 4 times a day. Patient states that when she drinks water it goes straight through. She states that she has taken all of her Lomotil ,says that she has doubled on them trying to stop this diarrhea. Imodium has not worked.She has also taken Bentyl. She cannot take Pepto Bismuth, Kaopectate. Patient states that from her rectum to her vagina she is raw.  She request something stronger for the diarrhea.  Per Dr.Rehman the patient may take the Lomotil QID prn diarrhea, she may take the Imodium on a schedule. She may take 4 mg TID.  The Rx was e-scribed to the patient's pharmacy. Patient called the office back and wanted something stronger, it was explained to the patient that this was the strongest thing to take.  Patient was advised condition worsened to go to the ED for evaluation.

## 2016-12-09 ENCOUNTER — Encounter (HOSPITAL_COMMUNITY)
Admission: RE | Admit: 2016-12-09 | Discharge: 2016-12-09 | Disposition: A | Discharge status: Home / Self Care | Payer: Commercial Managed Care - HMO | Source: Ambulatory Visit | Attending: Internal Medicine | Admitting: Internal Medicine

## 2016-12-09 DIAGNOSIS — K5090 Crohn's disease, unspecified, without complications: Secondary | ICD-10-CM | POA: Diagnosis not present

## 2016-12-09 MED ORDER — SODIUM CHLORIDE 0.9 % IV SOLN
INTRAVENOUS | Status: DC
  Administered 2016-12-09: 250 mL via INTRAVENOUS

## 2016-12-09 MED ORDER — ACETAMINOPHEN 325 MG PO TABS
650.0000 mg | ORAL_TABLET | Freq: Once | ORAL | Status: AC
  Administered 2016-12-09: 650 mg via ORAL
  Filled 2016-12-09: qty 2

## 2016-12-09 MED ORDER — LORATADINE 10 MG PO TABS
10.0000 mg | ORAL_TABLET | Freq: Every day | ORAL | Status: DC
  Administered 2016-12-09: 10 mg via ORAL
  Filled 2016-12-09: qty 1

## 2016-12-09 MED ORDER — SODIUM CHLORIDE 0.9 % IV SOLN
5.0000 mg/kg | Freq: Once | INTRAVENOUS | Status: AC
  Administered 2016-12-09: 300 mg via INTRAVENOUS
  Filled 2016-12-09: qty 30

## 2016-12-18 IMAGING — CR DG CHEST 2V
2 series · 2 of 2 positions shown · non-contrast
Comparison: Chest x-ray a 07/29/2015.

CLINICAL DATA: 69-year-old female with history of abdominal pain
common nausea, vomiting and diarrhea since yesterday evening.
Shortness of breath with chills.

EXAM:
CHEST  2 VIEW

[w chest lat]
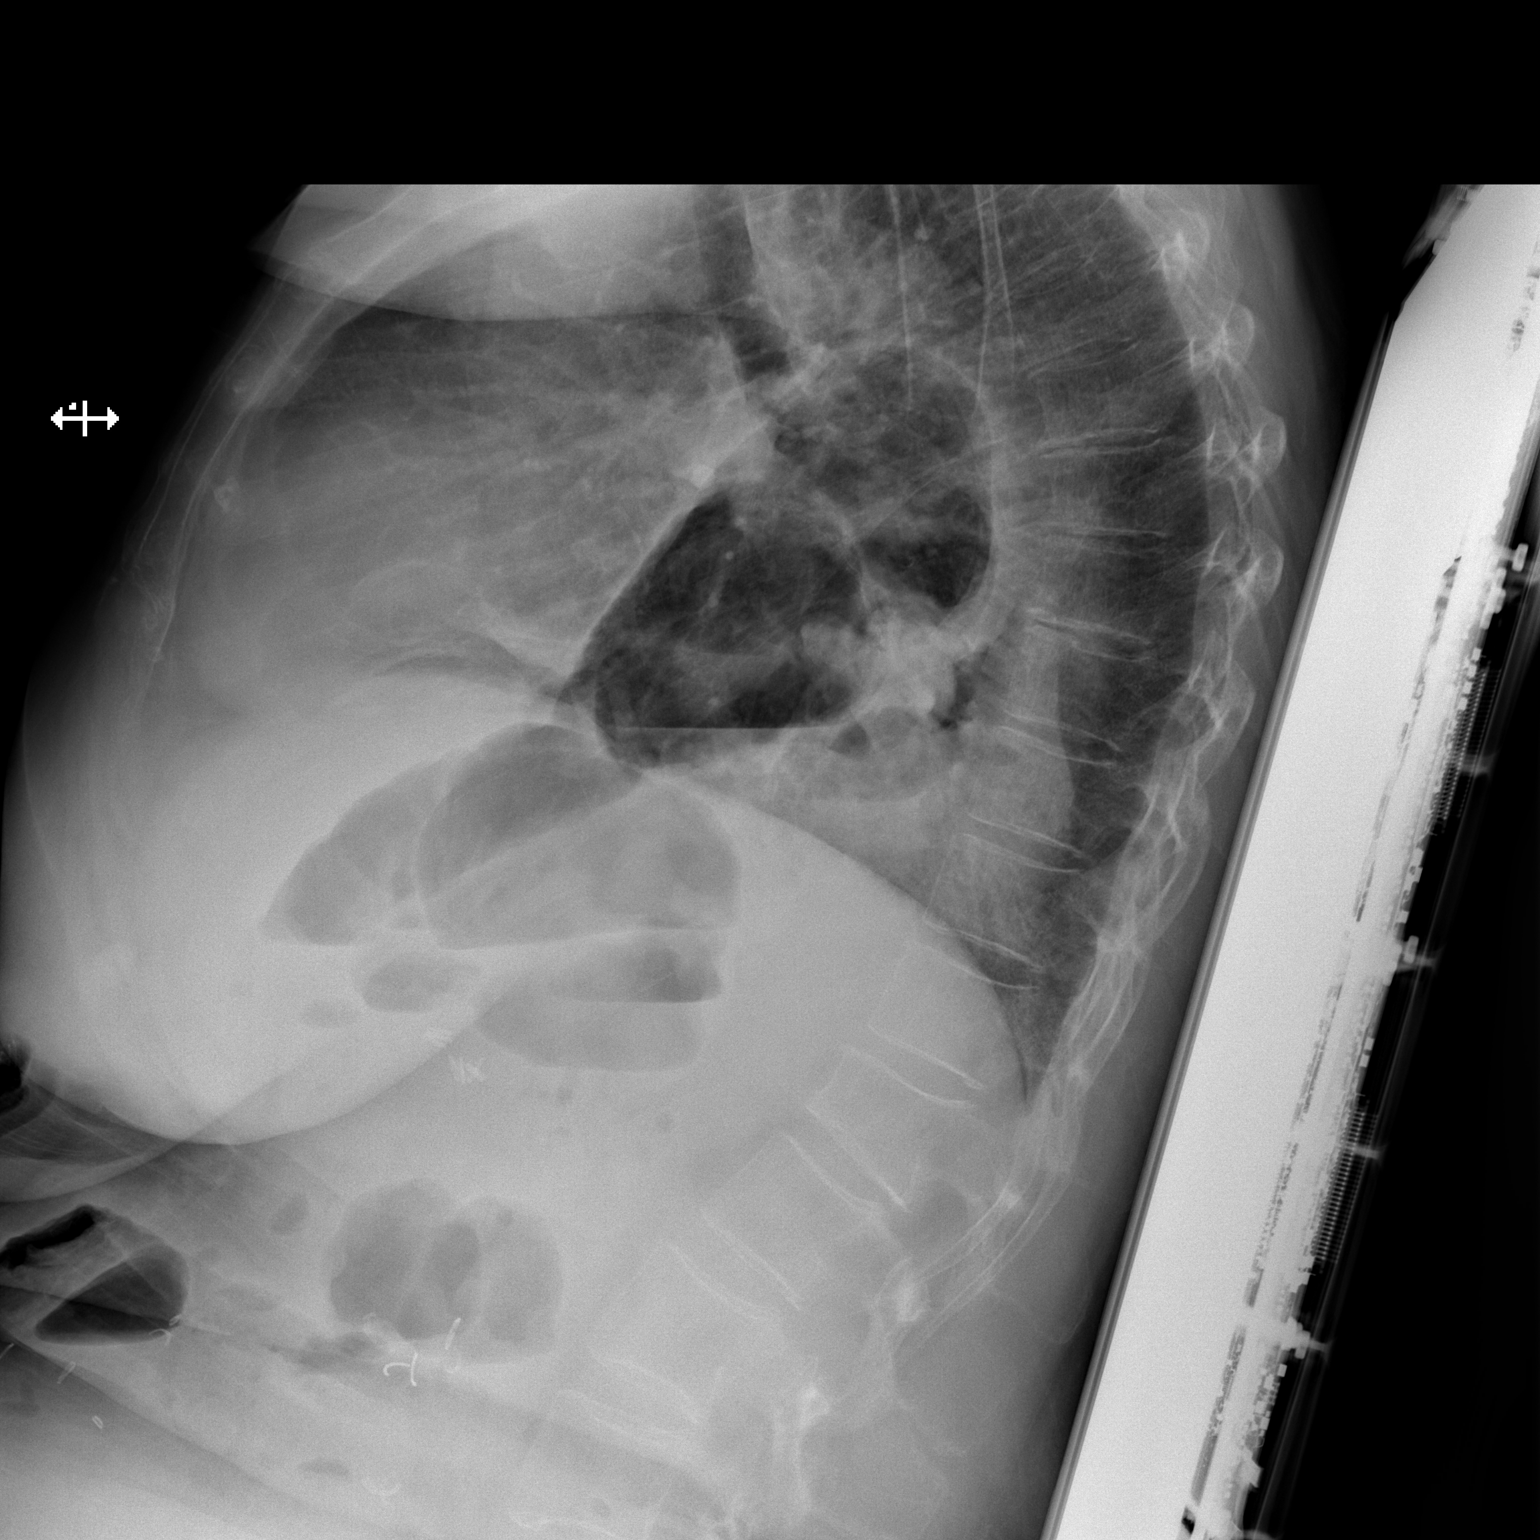

[x chest ap]
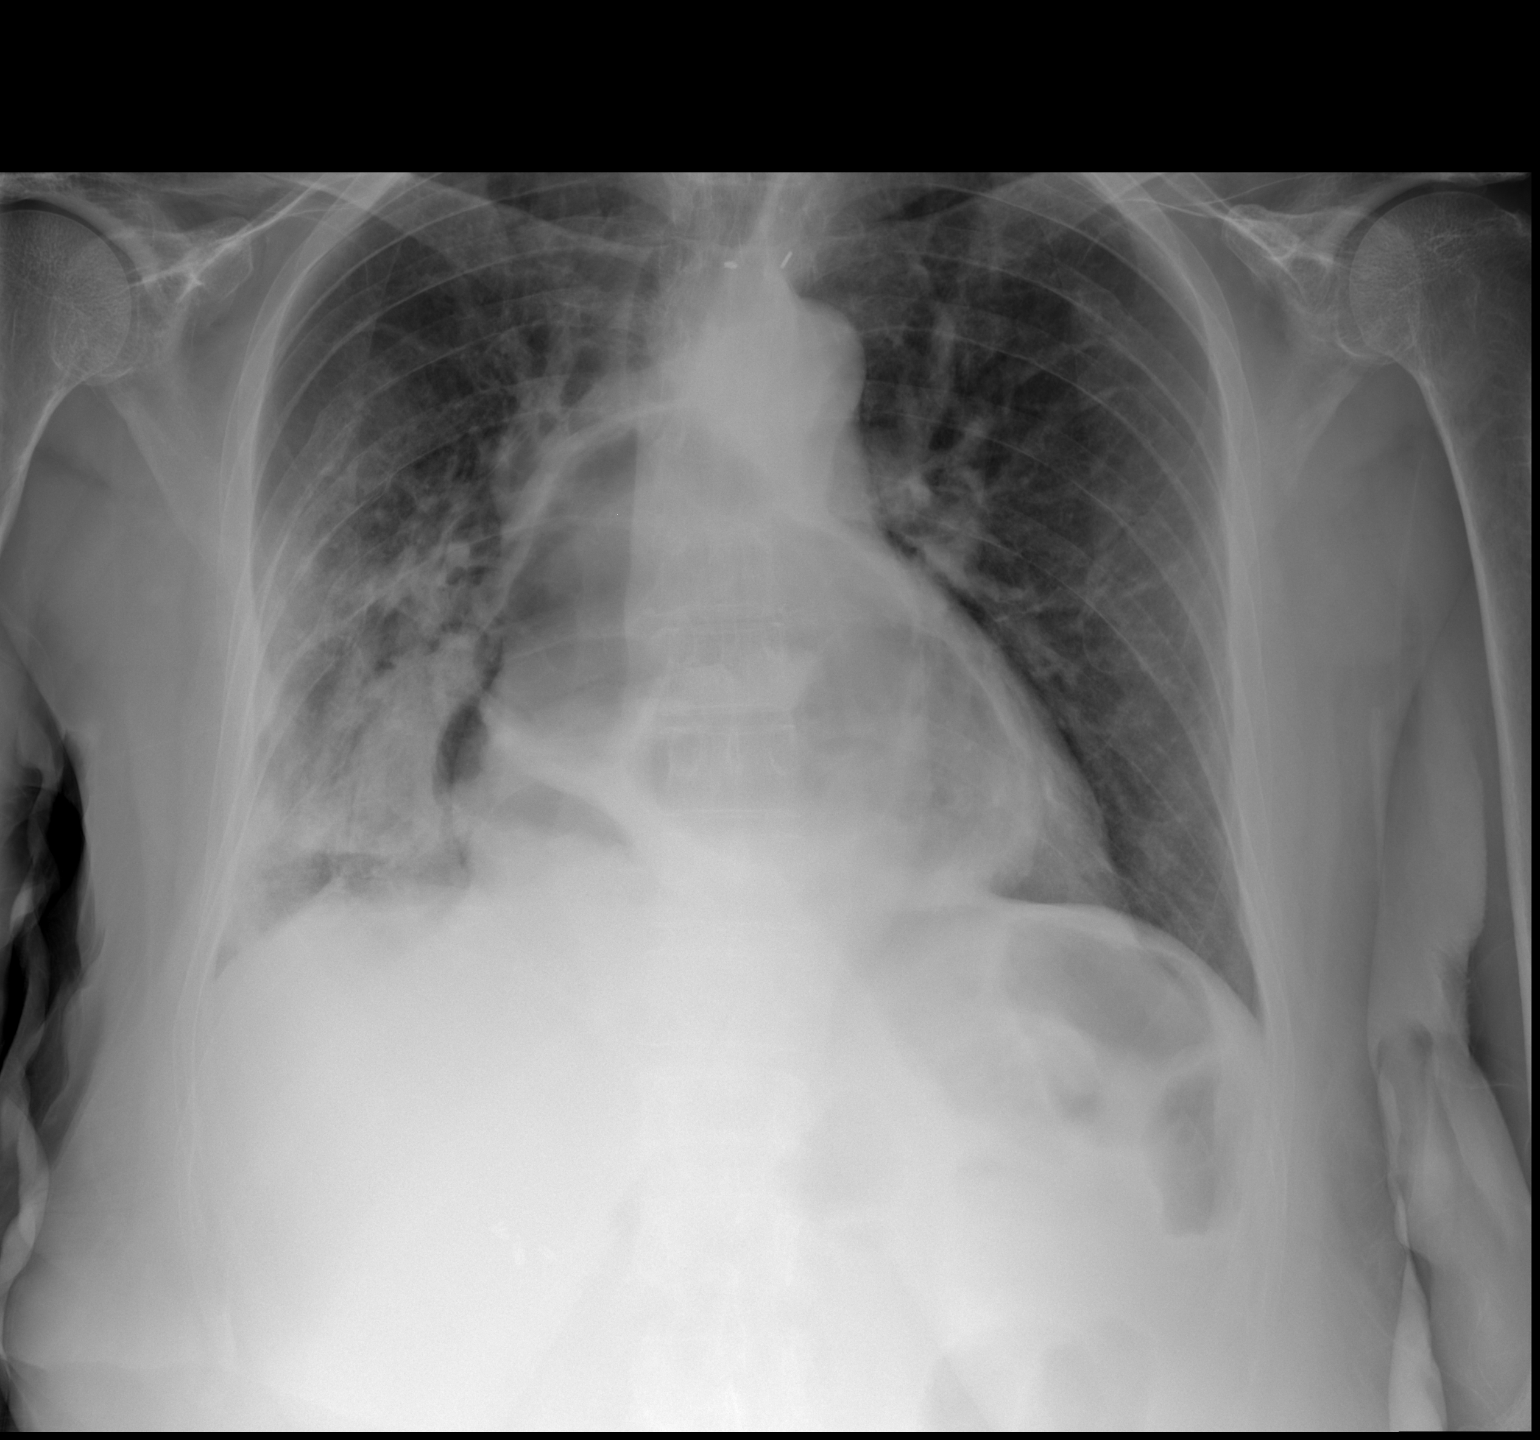

[2 of 2 positions shown; findings below may reference images not displayed]

FINDINGS: Extensive airspace consolidation throughout the right mid to lower
lung new compared to the prior examination. Left lung is clear. No
pleural effusions. No evidence of pulmonary edema. Heart size is
mildly enlarged. Massive hiatal hernia, which appears slightly
larger than the prior study and appears to contain both stomach and
colon. Surgical clips near the thoracic inlet.
IMPRESSION: 1. Extensive airspace consolidation throughout the right mid to
lower lung, predominantly in the right lower lobe, concerning for
aspiration pneumonia.
2. Enlarging massive hiatal hernia which contains both stomach and
colon.
3. Mild cardiomegaly.

## 2016-12-29 ENCOUNTER — Other Ambulatory Visit (INDEPENDENT_AMBULATORY_CARE_PROVIDER_SITE_OTHER): Payer: Self-pay | Admitting: Internal Medicine

## 2017-01-05 ENCOUNTER — Ambulatory Visit (INDEPENDENT_AMBULATORY_CARE_PROVIDER_SITE_OTHER): Payer: Commercial Managed Care - HMO | Admitting: Internal Medicine

## 2017-01-05 ENCOUNTER — Encounter (INDEPENDENT_AMBULATORY_CARE_PROVIDER_SITE_OTHER): Payer: Self-pay

## 2017-01-05 ENCOUNTER — Encounter (INDEPENDENT_AMBULATORY_CARE_PROVIDER_SITE_OTHER): Payer: Self-pay | Admitting: Internal Medicine

## 2017-01-05 VITALS — BP 110/82 | HR 72 | Temp 97.5°F | Resp 18 | Ht 64.4 in | Wt 151.6 lb

## 2017-01-05 DIAGNOSIS — R21 Rash and other nonspecific skin eruption: Secondary | ICD-10-CM | POA: Diagnosis not present

## 2017-01-05 DIAGNOSIS — K219 Gastro-esophageal reflux disease without esophagitis: Secondary | ICD-10-CM | POA: Diagnosis not present

## 2017-01-05 DIAGNOSIS — K50813 Crohn's disease of both small and large intestine with fistula: Secondary | ICD-10-CM | POA: Diagnosis not present

## 2017-01-05 DIAGNOSIS — R197 Diarrhea, unspecified: Secondary | ICD-10-CM | POA: Diagnosis not present

## 2017-01-05 DIAGNOSIS — D509 Iron deficiency anemia, unspecified: Secondary | ICD-10-CM

## 2017-01-05 DIAGNOSIS — Z8639 Personal history of other endocrine, nutritional and metabolic disease: Secondary | ICD-10-CM

## 2017-01-05 MED ORDER — CIPROFLOXACIN HCL 500 MG PO TABS: 500.0000 mg | ORAL_TABLET | Freq: Two times a day (BID) | ORAL | 1 refills | Status: DC

## 2017-01-05 MED ORDER — DIPHENOXYLATE-ATROPINE 2.5-0.025 MG PO TABS: 1.0000 | ORAL_TABLET | Freq: Four times a day (QID) | ORAL | 0 refills | Status: DC | PRN

## 2017-01-05 MED ORDER — NYSTATIN 100000 UNIT/GM EX CREA: 1.0000 "application " | TOPICAL_CREAM | Freq: Two times a day (BID) | CUTANEOUS | 1 refills | Status: DC | PRN

## 2017-01-05 MED ORDER — METRONIDAZOLE 250 MG PO TABS: 250.0000 mg | ORAL_TABLET | Freq: Three times a day (TID) | ORAL | 0 refills | Status: DC

## 2017-01-05 MED ORDER — DICYCLOMINE HCL 10 MG/5ML PO SOLN: 20.0000 mg | Freq: Three times a day (TID) | ORAL | 3 refills | Status: DC | PRN

## 2017-01-05 NOTE — Progress Notes (Signed)
Presenting complaint;  Follow-up for Crohn's disease GERD and iron deficiency anemia.  Subjective:  Danielle [name] is 71 year old African-American female who is here for scheduled visit. She was last seen 3 months ago. She has lost 8 pounds since her last visit and she believed visit because she does not have LE edema anymore. She is using Lasix no more than twice a week. She has very good appetite. She feels heartburns well controlled with therapy. She is not having any side effects with PPI. She is having an average of 3 stools per day. Stool score is either 5 or 6 on Bristol stool scale. As noted painful lumpy area in perianal region. She has not noted any drainage. She denies fever or chills. She has not required trip to emergency room since her last visit. Needs multiple refills.    Current Medications: Outpatient Encounter Prescriptions as of 01/05/2017  Medication Sig  . acetaminophen (TYLENOL) 500 MG tablet Take 1,000 mg by mouth every 6 (six) hours as needed for mild pain or moderate pain.  [name] albuterol (PROVENTIL HFA;VENTOLIN HFA) 108 (90 Base) MCG/ACT inhaler Inhale 2 puffs into the lungs every 6 (six) hours as needed for wheezing.  . Calcium Carbonate-Vitamin D (CALCIUM + D PO) Take 1 tablet by mouth daily.  . Cyanocobalamin (VITAMIN B-12 IJ) Inject 1 each as directed every 30 (thirty) days. Around the 1st week of each month  . dicyclomine (BENTYL) 10 MG/5ML syrup TAKE 2 TEASPOONFULS BY MOUTH 3 TIMES A DAY BEFORE MEALS  . diphenhydrAMINE (BENADRYL) 25 mg capsule Take 25 mg by mouth every 6 (six) hours as needed for itching.  . diphenoxylate-atropine (LOMOTIL) 2.5-0.025 MG tablet Take 1 tablet by mouth 4 (four) times daily as needed for diarrhea or loose stools.  [name] FLUZONE HIGH-DOSE 0.5 ML SUSY 0.5 mLs.   . furosemide (LASIX) 40 MG tablet Take 1 tablet (40 mg total) by mouth daily as needed for edema.  [name] HYDROcodone-acetaminophen (NORCO) 7.5-325 MG tablet Take 1 tablet by mouth every 4 (four)  hours as needed for moderate pain.  [name] inFLIXimab (REMICADE) 100 MG injection Inject 100 mg into the vein every 6 (six) weeks.  . Multiple Vitamin (MULTIVITAMIN WITH MINERALS) TABS Take 1 tablet by mouth daily.  [name] nystatin cream (MYCOSTATIN) Apply 1 application topically 2 (two) times daily as needed for dry skin.  . NYSTATIN powder USE TO AFFECTED AREA TWICE DAILY  . ondansetron (ZOFRAN) 4 MG tablet Take 1 tablet (4 mg total) by mouth as needed for nausea or vomiting. Must 30 days before next refill  . pantoprazole (PROTONIX) 40 MG tablet TAKE 1 TABLET (40 MG TOTAL) BY MOUTH 2 (TWO) TIMES DAILY BEFORE A MEAL.  [name] potassium chloride SA (K-DUR,KLOR-CON) 20 MEQ tablet Take 1 tablet (20 mEq total) by mouth daily as needed. Take potassium on days U take furosemide. (Patient taking differently: Take 20 mEq by mouth daily as needed. Take potassium on days while taking furosemide.)  . promethazine (PHENERGAN) 25 MG tablet TAKE 1 TABLET BY MOUTH TWICE A DAY AS NEEDED  . spironolactone (ALDACTONE) 50 MG tablet Take 1 tablet (50 mg total) by mouth daily.  [name] triamcinolone cream (KENALOG) 0.1 % Apply 1 application topically 2 (two) times daily as needed.  . Vitamin D, Ergocalciferol, (DRISDOL) 50000 units CAPS capsule Take 1 capsule (50,000 Units total) by mouth every 7 (seven) days.  . [DISCONTINUED] doxycycline (MONODOX) 100 MG capsule Take 1 capsule (100 mg total) by mouth 2 (two) times daily. (Patient not  taking: Reported on 01/05/2017)  . [DISCONTINUED] fluconazole (DIFLUCAN) 150 MG tablet TAKE 1 TABLET BY MOUTH NOW, THEN 7 DAYS LATER (Patient not taking: Reported on 10/16/2016)  . [DISCONTINUED] predniSONE (DELTASONE) 20 MG tablet Take 1 tablet (20 mg total) by mouth as directed. (Patient not taking: Reported on 10/16/2016)  . [DISCONTINUED] predniSONE (DELTASONE) 20 MG tablet TAKE 1/2 TABLET BY MOUTH EVERY DAY WITH BREAKFAST (Patient not taking: Reported on 01/05/2017)   No facility-administered encounter  medications on file as of 01/05/2017.      Objective: Blood pressure 110/82, pulse 72, temperature 97.5 F (36.4 C), temperature source Oral, resp. rate 18, height 5' 4.4" (1.636 m), weight 151 lb 9.6 oz (68.8 kg). Patient is alert and in no acute distress. Conjunctiva is pink. Sclera is nonicteric Oropharyngeal mucosa is normal. No neck masses or thyromegaly noted. Cardiac exam with regular rhythm normal S1 and S2. No murmur or gallop noted. Lungs are clear to auscultation. Abdomen is symmetrical with low midline scar. On palpation abdomen is soft and nontender without organomegaly or masses. Rectal examination is limited to external inspection. She has edema to perianal skin and indurated area on the left side measuring about 2 x 3 cm. It is not fluctuant. No LE edema or clubbing noted.   Assessment:  #1. Crohn's disease. She has developed indurated area and perianal region. She has had surgery for perianal disease before. She does not appear to be toxic. She is afebrile. She will need to go back on antibiotic and will need to follow-up with Dr. Leighton Ruff a colorectal surgeon. Next infliximab infusion is due in 2 weeks. Need to make sure that she has responded to antibiotic therapy before she proceeds with infusion. He is also due for surveillance colonoscopy #2. GERD. She has large hiatal hernia. She is doing well with double dose PPI. I would like to try single dose PPI and see how she does. #3. History of iron deficiency anemia. #4. History of hypokalemia.   Plan:  Cipro 500 mg by mouth twice a day for 2 weeks. Metronidazole 250 mg by mouth 3 times a day for 2 weeks. Decrease pantoprazole to 40 mg by mouth every morning. Will change prescription at next refill if she does well. CBC and metabolic 7. Patient advised to call office with progress report in 2 weeks. Office visit in 3 months. Will plan colonoscopy following her next visit.

## 2017-01-05 NOTE — Patient Instructions (Signed)
Decrease pantoprazole to once a day daily before breakfast. Can go back to twice daily if heartburn relapses. Physician will call with results of blood tests when completed. Progress report in 2 weeks.

## 2017-01-06 ENCOUNTER — Encounter (INDEPENDENT_AMBULATORY_CARE_PROVIDER_SITE_OTHER): Payer: Self-pay | Admitting: Internal Medicine

## 2017-01-14 ENCOUNTER — Other Ambulatory Visit (INDEPENDENT_AMBULATORY_CARE_PROVIDER_SITE_OTHER): Payer: Self-pay | Admitting: Internal Medicine

## 2017-01-15 DIAGNOSIS — Z8639 Personal history of other endocrine, nutritional and metabolic disease: Secondary | ICD-10-CM | POA: Diagnosis not present

## 2017-01-15 DIAGNOSIS — D509 Iron deficiency anemia, unspecified: Secondary | ICD-10-CM | POA: Diagnosis not present

## 2017-01-15 DIAGNOSIS — K50813 Crohn's disease of both small and large intestine with fistula: Secondary | ICD-10-CM | POA: Diagnosis not present

## 2017-01-15 LAB — CBC
HCT: 32 % — ABNORMAL LOW (ref 35.0–45.0)
Hemoglobin: 9.8 g/dL — ABNORMAL LOW (ref 11.7–15.5)
MCH: 25.3 pg — AB (ref 27.0–33.0)
MCHC: 30.6 g/dL — AB (ref 32.0–36.0)
MCV: 82.5 fL (ref 80.0–100.0)
MPV: 8.8 fL (ref 7.5–12.5)
PLATELETS: 340 10*3/uL (ref 140–400)
RBC: 3.88 MIL/uL (ref 3.80–5.10)
RDW: 15.6 % — ABNORMAL HIGH (ref 11.0–15.0)
WBC: 20.4 10*3/uL — ABNORMAL HIGH (ref 3.8–10.8)

## 2017-01-16 LAB — BASIC METABOLIC PANEL
BUN: 15 mg/dL (ref 7–25)
CALCIUM: 7.9 mg/dL — AB (ref 8.6–10.4)
CO2: 17 mmol/L — ABNORMAL LOW (ref 20–31)
CREATININE: 1.27 mg/dL — AB (ref 0.50–0.99)
Chloride: 113 mmol/L — ABNORMAL HIGH (ref 98–110)
GLUCOSE: 68 mg/dL (ref 65–99)
Potassium: 3.4 mmol/L — ABNORMAL LOW (ref 3.5–5.3)
Sodium: 139 mmol/L (ref 135–146)

## 2017-01-18 ENCOUNTER — Other Ambulatory Visit (INDEPENDENT_AMBULATORY_CARE_PROVIDER_SITE_OTHER): Payer: Self-pay | Admitting: *Deleted

## 2017-01-18 DIAGNOSIS — K50013 Crohn's disease of small intestine with fistula: Secondary | ICD-10-CM

## 2017-01-20 ENCOUNTER — Encounter (HOSPITAL_COMMUNITY)
Admission: RE | Admit: 2017-01-20 | Discharge: 2017-01-20 | Disposition: A | Discharge status: Home / Self Care | Payer: Medicare HMO | Source: Ambulatory Visit | Attending: Internal Medicine | Admitting: Internal Medicine

## 2017-01-20 NOTE — Progress Notes (Signed)
Patient called today to report that she has a "stomach bug" and will call back to reschedule Remicade infusion.

## 2017-01-28 ENCOUNTER — Encounter (HOSPITAL_COMMUNITY)
Admission: RE | Admit: 2017-01-28 | Discharge: 2017-01-28 | Disposition: A | Discharge status: Home / Self Care | Payer: Medicare HMO | Source: Ambulatory Visit | Attending: Internal Medicine | Admitting: Internal Medicine

## 2017-01-28 DIAGNOSIS — K5090 Crohn's disease, unspecified, without complications: Secondary | ICD-10-CM | POA: Diagnosis not present

## 2017-01-28 MED ORDER — LORATADINE 10 MG PO TABS
10.0000 mg | ORAL_TABLET | Freq: Every day | ORAL | Status: DC
  Administered 2017-01-28: 10 mg via ORAL
  Filled 2017-01-28: qty 1

## 2017-01-28 MED ORDER — SODIUM CHLORIDE 0.9 % IV SOLN
Freq: Once | INTRAVENOUS | Status: AC
Start: 2017-01-28 — End: 2017-01-28
  Administered 2017-01-28: 10:00:00 via INTRAVENOUS

## 2017-01-28 MED ORDER — SODIUM CHLORIDE 0.9 % IV SOLN
300.0000 mg | INTRAVENOUS | Status: DC
  Administered 2017-01-28: 300 mg via INTRAVENOUS
  Filled 2017-01-28: qty 30

## 2017-01-28 MED ORDER — ACETAMINOPHEN 325 MG PO TABS
650.0000 mg | ORAL_TABLET | Freq: Once | ORAL | Status: AC
  Administered 2017-01-28: 650 mg via ORAL
  Filled 2017-01-28: qty 2

## 2017-01-29 ENCOUNTER — Other Ambulatory Visit (INDEPENDENT_AMBULATORY_CARE_PROVIDER_SITE_OTHER): Payer: Self-pay | Admitting: Internal Medicine

## 2017-02-01 ENCOUNTER — Other Ambulatory Visit (INDEPENDENT_AMBULATORY_CARE_PROVIDER_SITE_OTHER): Payer: Self-pay | Admitting: *Deleted

## 2017-02-01 ENCOUNTER — Encounter (INDEPENDENT_AMBULATORY_CARE_PROVIDER_SITE_OTHER): Payer: Self-pay | Admitting: *Deleted

## 2017-02-01 DIAGNOSIS — K50013 Crohn's disease of small intestine with fistula: Secondary | ICD-10-CM

## 2017-02-03 ENCOUNTER — Emergency Department (HOSPITAL_COMMUNITY)
Admission: EM | Admit: 2017-02-03 | Discharge: 2017-02-03 | Disposition: A | Discharge status: Home / Self Care | Payer: Medicare HMO | Attending: Emergency Medicine | Admitting: Emergency Medicine

## 2017-02-03 ENCOUNTER — Emergency Department (HOSPITAL_COMMUNITY): Payer: Medicare HMO

## 2017-02-03 ENCOUNTER — Encounter (HOSPITAL_COMMUNITY): Payer: Self-pay | Admitting: Nurse Practitioner

## 2017-02-03 DIAGNOSIS — K5010 Crohn's disease of large intestine without complications: Secondary | ICD-10-CM | POA: Insufficient documentation

## 2017-02-03 DIAGNOSIS — N3000 Acute cystitis without hematuria: Secondary | ICD-10-CM

## 2017-02-03 DIAGNOSIS — N183 Chronic kidney disease, stage 3 (moderate): Secondary | ICD-10-CM | POA: Insufficient documentation

## 2017-02-03 DIAGNOSIS — K2970 Gastritis, unspecified, without bleeding: Secondary | ICD-10-CM | POA: Diagnosis not present

## 2017-02-03 DIAGNOSIS — R109 Unspecified abdominal pain: Secondary | ICD-10-CM | POA: Diagnosis not present

## 2017-02-03 DIAGNOSIS — R1030 Lower abdominal pain, unspecified: Secondary | ICD-10-CM | POA: Diagnosis present

## 2017-02-03 DIAGNOSIS — Z87891 Personal history of nicotine dependence: Secondary | ICD-10-CM | POA: Diagnosis not present

## 2017-02-03 DIAGNOSIS — R112 Nausea with vomiting, unspecified: Secondary | ICD-10-CM | POA: Diagnosis not present

## 2017-02-03 DIAGNOSIS — R1110 Vomiting, unspecified: Secondary | ICD-10-CM | POA: Diagnosis not present

## 2017-02-03 DIAGNOSIS — R197 Diarrhea, unspecified: Secondary | ICD-10-CM | POA: Diagnosis not present

## 2017-02-03 LAB — URINALYSIS, ROUTINE W REFLEX MICROSCOPIC
BILIRUBIN URINE: NEGATIVE
Glucose, UA: NEGATIVE mg/dL
KETONES UR: NEGATIVE mg/dL
NITRITE: NEGATIVE
PROTEIN: 30 mg/dL — AB
Specific Gravity, Urine: 1.024 (ref 1.005–1.030)
pH: 6 (ref 5.0–8.0)

## 2017-02-03 LAB — COMPREHENSIVE METABOLIC PANEL
ALBUMIN: 3.8 g/dL (ref 3.5–5.0)
ALT: 32 U/L (ref 14–54)
ANION GAP: 8 (ref 5–15)
AST: 26 U/L (ref 15–41)
Alkaline Phosphatase: 130 U/L — ABNORMAL HIGH (ref 38–126)
BUN: 16 mg/dL (ref 6–20)
CHLORIDE: 110 mmol/L (ref 101–111)
CO2: 23 mmol/L (ref 22–32)
Calcium: 9.2 mg/dL (ref 8.9–10.3)
Creatinine, Ser: 1.27 mg/dL — ABNORMAL HIGH (ref 0.44–1.00)
GFR calc Af Amer: 49 mL/min — ABNORMAL LOW (ref >60)
GFR, EST NON AFRICAN AMERICAN: 42 mL/min — AB (ref >60)
Glucose, Bld: 117 mg/dL — ABNORMAL HIGH (ref 65–99)
POTASSIUM: 3.1 mmol/L — AB (ref 3.5–5.1)
Sodium: 141 mmol/L (ref 135–145)
TOTAL PROTEIN: 8 g/dL (ref 6.5–8.1)
Total Bilirubin: 0.4 mg/dL (ref 0.3–1.2)

## 2017-02-03 LAB — CBC
HEMATOCRIT: 35.2 % — AB (ref 36.0–46.0)
HEMOGLOBIN: 10.7 g/dL — AB (ref 12.0–15.0)
MCH: 24.7 pg — ABNORMAL LOW (ref 26.0–34.0)
MCHC: 30.4 g/dL (ref 30.0–36.0)
MCV: 81.1 fL (ref 78.0–100.0)
Platelets: 402 10*3/uL — ABNORMAL HIGH (ref 150–400)
RBC: 4.34 MIL/uL (ref 3.87–5.11)
RDW: 16 % — AB (ref 11.5–15.5)
WBC: 23.6 10*3/uL — AB (ref 4.0–10.5)

## 2017-02-03 LAB — LIPASE, BLOOD: LIPASE: 20 U/L (ref 11–51)

## 2017-02-03 MED ORDER — CEPHALEXIN 500 MG PO CAPS
500.0000 mg | ORAL_CAPSULE | Freq: Once | ORAL | Status: AC
  Administered 2017-02-03: 500 mg via ORAL
  Filled 2017-02-03: qty 1

## 2017-02-03 MED ORDER — IOPAMIDOL (ISOVUE-300) INJECTION 61%
100.0000 mL | Freq: Once | INTRAVENOUS | Status: AC | PRN
  Administered 2017-02-03: 100 mL via INTRAVENOUS

## 2017-02-03 MED ORDER — POTASSIUM CHLORIDE CRYS ER 20 MEQ PO TBCR
40.0000 meq | EXTENDED_RELEASE_TABLET | Freq: Once | ORAL | Status: AC
  Administered 2017-02-03: 40 meq via ORAL
  Filled 2017-02-03: qty 2

## 2017-02-03 MED ORDER — IOPAMIDOL (ISOVUE-300) INJECTION 61%
INTRAVENOUS | Status: AC
  Filled 2017-02-03: qty 30

## 2017-02-03 MED ORDER — PROMETHAZINE HCL 25 MG/ML IJ SOLN
12.5000 mg | Freq: Once | INTRAMUSCULAR | Status: AC
  Administered 2017-02-03: 12.5 mg via INTRAVENOUS
  Filled 2017-02-03: qty 1

## 2017-02-03 MED ORDER — IOPAMIDOL (ISOVUE-300) INJECTION 61%
INTRAVENOUS | Status: AC
  Administered 2017-02-03: 100 mL via INTRAVENOUS
  Filled 2017-02-03: qty 100

## 2017-02-03 MED ORDER — FENTANYL CITRATE (PF) 100 MCG/2ML IJ SOLN
100.0000 ug | Freq: Once | INTRAMUSCULAR | Status: AC
  Administered 2017-02-03: 100 ug via INTRAVENOUS
  Filled 2017-02-03: qty 2

## 2017-02-03 MED ORDER — CEPHALEXIN 500 MG PO CAPS: 500.0000 mg | ORAL_CAPSULE | Freq: Two times a day (BID) | ORAL | 0 refills | Status: DC

## 2017-02-03 MED ORDER — CEPHALEXIN 500 MG PO CAPS: 500.0000 mg | ORAL_CAPSULE | Freq: Once | ORAL | Status: DC

## 2017-02-03 MED ORDER — PROMETHAZINE HCL 12.5 MG RE SUPP
12.5000 mg | Freq: Once | RECTAL | Status: AC | PRN
  Administered 2017-02-03: 12.5 mg via RECTAL
  Filled 2017-02-03: qty 1

## 2017-02-03 MED ORDER — SODIUM CHLORIDE 0.9 % IV BOLUS (SEPSIS)
1000.0000 mL | Freq: Once | INTRAVENOUS | Status: AC
  Administered 2017-02-03: 1000 mL via INTRAVENOUS

## 2017-02-03 MED ORDER — DOXYCYCLINE HYCLATE 100 MG PO TABS: 100.0000 mg | ORAL_TABLET | Freq: Once | ORAL | Status: DC

## 2017-02-03 MED ORDER — IOPAMIDOL (ISOVUE-300) INJECTION 61%: 30.0000 mL | Freq: Once | INTRAVENOUS | Status: DC | PRN

## 2017-02-03 NOTE — ED Notes (Signed)
Bed: [id number] Expected date:  Expected time:  Means of arrival:  Comments: EMS 71 yo female abdominal pain nausea and diarrhea-hx Crohn's

## 2017-02-03 NOTE — ED Notes (Signed)
Patient understood discharge instructions and was A & Ox4.

## 2017-02-03 NOTE — ED Notes (Signed)
Upon discharge, patient was requesting pain medication.  I informed patient she was not getting pain medication, she had already spoken to MD regarding this request.  Ongoing, nurse informed me patient refused Ceflex antibiotic and she was upset over pain medication.  Upon discharging patient, I asked if patient could walk, she stated yes.  When she came out of her room with daughter, family member became upset stating patient would not and I stated to her I could get her a wheelchair.  Family member  became very angry and started using foul language when walking out the door. Patient and family member stated they was unhappy with patient care and satisfaction they received from the hospital.  Family member said she would call and report this to higher management. Family member was beligerent and Financial risk analyst a "B...". In addition, this was witnessed by nurse student

## 2017-02-03 NOTE — ED Triage Notes (Signed)
Pt brought in to ED via EMS. Per EMS patient states she began having sever generalized abdominal pain, nausea, and vomiting later last night. She has a hx of Crohn's and verbalized this feels like a flare up. Pt informed Ems that she just finished a course of Prednisone for her Crohn's. Denies any other significant medical history. States she has had 4 episodes of vomiting greenish/yellow colored bile. She was NSR, 126/88, 99% RA, 90, 98.0 oral. She has a #20 LAC and was given Zofran 4 mg IV with some relief.

## 2017-02-03 NOTE — ED Notes (Signed)
Patient is refusing to take Keflex as she states she is nauseated and needs something additional for pain. PA notified and Phenergan suppository ordered. Pt is aware that staff is awaiting medication. At this time she will not receive additional pain medication however, patient states she will not be leaving without phenergan and additional pain medication. Oncoming nurse and PA aware.

## 2017-02-03 NOTE — ED Notes (Signed)
Patient transported to CT 

## 2017-02-03 NOTE — ED Provider Notes (Signed)
[facility] DEPT Provider Note   CSN: [id number] Arrival date & time: 02/03/17  [id number]   By signing my name below, I, [name], attest that this documentation has been prepared under the direction and in the presence of Everlene Balls, MD. Electronically Signed: [name], ED Scribe. 02/03/17. 4:00 AM.  History   Chief Complaint Chief Complaint  Patient presents with  . Abdominal Pain  . Nausea  . Emesis    HPI DEMMI [name] is a 71 y.o. female with a PMHx of chron's disease, who presents to the Emergency Department brought in by EMS complaining of upper abdominal pain onset PTA. Pt reports associated vomiting and diarrhea. She has tried tylenol with no relief of her symptoms. Pt notes that she is unsure if her current symptoms are an exacerbation of her chron's disease. She states that she recently completed a course of prednisone for a recent flare of her chron's. She has had a colostomy, cholecystectomy, and appendectomy. She denies fever, chills, blood in vomit, dysuria, hematuria, and any other symptoms. Denies sick contacts.    The history is provided by the patient. No language interpreter was used.    Past Medical History:  Diagnosis Date  . Crohn disease ([place])   . Hiatal hernia 12/07/2015  . Psoriasis     Patient Active Problem List   Diagnosis Date Noted  . Immunosuppressed status ([place]) 12/09/2015  . 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 Hiatal hernia 12/07/2015  . Sepsis due to pneumonia ([place]) 12/06/2015  . CKD (chronic kidney disease) stage 3, GFR 30-59 ml/min 12/06/2015  . Crohn's disease with fistula ([place])   . Hypomagnesemia   . ARF (acute renal failure) ([place]) 11/18/2014  . Leukocytosis 11/06/2011    Past Surgical History:  Procedure Laterality Date  . ANAL STENOSIS  ~ 2010  . APPENDECTOMY  1980's  . BOWEL RESECTION  2009  . CHOLECYSTECTOMY  1990's  . COLONOSCOPY  2013  . COLOSTOMY  2009   DEMASO  . COLOSTOMY TAKEDOWN  2009   "only had it for about 1  month" (08/22/2013)  . ESOPHAGOGASTRODUODENOSCOPY  11/11/2011   Procedure: ESOPHAGOGASTRODUODENOSCOPY (EGD);  Surgeon: Missy Sabins, MD;  Location[facility] ENDOSCOPY;  Service: Endoscopy;  Laterality: N/A;  . ESOPHAGOGASTRODUODENOSCOPY  05/18/2012   Procedure: ESOPHAGOGASTRODUODENOSCOPY (EGD);  Surgeon: Rogene Houston, MD;  Location: [facility]O [facility];  Service: Endoscopy;  Laterality: N/A;  200  . FLEXIBLE SIGMOIDOSCOPY  11/11/2011   Procedure: FLEXIBLE SIGMOIDOSCOPY;  Surgeon: Missy Sabins, MD;  Location: [place];  Service: Endoscopy;  Laterality: N/A;  . goiter  1999  . LEFT HEART CATHETERIZATION WITH CORONARY ANGIOGRAM N/A 07/09/2014   Procedure: LEFT HEART CATHETERIZATION WITH CORONARY ANGIOGRAM;  Surgeon: Peter M Martinique, MD;  Location: [facility] CATH LAB;  Service: Cardiovascular;  Laterality: N/A;  . PROCTOSCOPY N/A 01/31/2015   Procedure: RIGID PROCTOSCOPY;  Surgeon: Leighton Ruff, MD;  Location: [place];  Service: General;  Laterality: N/A;  . RECTAL EXAM UNDER ANESTHESIA N/A 01/31/2015   Procedure: RECTAL EXAM UNDER ANESTHESIA WITH PLACEMENT OF A SETON;  Surgeon: Leighton Ruff, MD;  Location: [place];  Service: General;  Laterality: N/A;    OB History    No data available       Home Medications    Prior to Admission medications   Medication Sig Start Date End Date Taking? Authorizing Provider  albuterol (PROVENTIL HFA;VENTOLIN HFA) 108 (90 Base) MCG/ACT inhaler Inhale 2 puffs into the lungs every 6 (six) hours as needed for  wheezing. 12/09/15  Yes Venetia Maxon Rama, MD  acetaminophen (TYLENOL) 500 MG tablet Take 1,000 mg by mouth every 6 (six) hours as needed for mild pain or moderate pain.    Historical Provider, MD  Calcium Carbonate-Vitamin D (CALCIUM + D PO) Take 1 tablet by mouth daily.    Historical Provider, MD  ciprofloxacin (CIPRO) 500 MG tablet Take 1 tablet (500 mg total) by mouth 2 (two) times daily. 01/05/17   Rogene Houston, MD  Cyanocobalamin (VITAMIN B-12 IJ) Inject 1 each as directed  every 30 (thirty) days. Around the 1st week of each month    Historical Provider, MD  dicyclomine (BENTYL) 10 MG/5ML syrup Take 10 mLs (20 mg total) by mouth 3 (three) times daily as needed. 01/05/17   Rogene Houston, MD  diphenhydrAMINE (BENADRYL) 25 mg capsule Take 25 mg by mouth every 6 (six) hours as needed for itching. 03/22/14   Sheila Oats, MD  diphenoxylate-atropine (LOMOTIL) 2.5-0.025 MG tablet Take 1 tablet by mouth 4 (four) times daily as needed for diarrhea or loose stools. 01/05/17   Rogene Houston, MD  fluconazole (DIFLUCAN) 150 MG tablet TAKE 1 TABLET BY MOUTH NOW, THEN 7 DAYS LATER 01/15/17   Rogene Houston, MD  FLUZONE HIGH-DOSE 0.5 ML SUSY 0.5 mLs.  08/19/16   Historical Provider, MD  furosemide (LASIX) 40 MG tablet Take 1 tablet (40 mg total) by mouth daily as needed for edema. 06/30/16   Rogene Houston, MD  HYDROcodone-acetaminophen (NORCO) 7.5-325 MG tablet Take 1 tablet by mouth every 4 (four) hours as needed for moderate pain. 10/16/16   Dorie Rank, MD  inFLIXimab (REMICADE) 100 MG injection Inject 100 mg into the vein every 6 (six) weeks.    Historical Provider, MD  metroNIDAZOLE (FLAGYL) 250 MG tablet Take 1 tablet (250 mg total) by mouth 3 (three) times daily. 01/05/17   Rogene Houston, MD  Multiple Vitamin (MULTIVITAMIN WITH MINERALS) TABS Take 1 tablet by mouth daily.    Historical Provider, MD  nystatin cream (MYCOSTATIN) Apply 1 application topically 2 (two) times daily as needed for dry skin. 01/05/17   Rogene Houston, MD  NYSTATIN powder USE TO AFFECTED AREA TWICE DAILY 04/13/16   Rogene Houston, MD  ondansetron (ZOFRAN) 4 MG tablet Take 1 tablet (4 mg total) by mouth as needed for nausea or vomiting. Must 30 days before next refill 03/13/16   Butch Penny, NP  pantoprazole (PROTONIX) 40 MG tablet TAKE 1 TABLET (40 MG TOTAL) BY MOUTH 2 (TWO) TIMES DAILY BEFORE A MEAL. 05/25/16   Butch Penny, NP  potassium chloride SA (K-DUR,KLOR-CON) 20 MEQ tablet Take 1 tablet (20  mEq total) by mouth daily as needed. Take potassium on days U take furosemide. Patient taking differently: Take 20 mEq by mouth daily as needed. Take potassium on days while taking furosemide. 06/30/16   Rogene Houston, MD  promethazine (PHENERGAN) 25 MG tablet TAKE 1 TABLET BY MOUTH TWICE A DAY AS NEEDED 02/01/17   Rogene Houston, MD  spironolactone (ALDACTONE) 50 MG tablet Take 1 tablet (50 mg total) by mouth daily. 12/09/15   Venetia Maxon Rama, MD  triamcinolone cream (KENALOG) 0.1 % Apply 1 application topically 2 (two) times daily as needed. 03/24/16   Rogene Houston, MD  Vitamin D, Ergocalciferol, (DRISDOL) 50000 units CAPS capsule Take 1 capsule (50,000 Units total) by mouth every 7 (seven) days. 07/19/16   Rogene Houston, MD  Family History Family History  Problem Relation Age of Onset  . Diabetes Mother   . Heart disease Father   . Healthy Sister   . Hypertension Brother   . Colon cancer Brother     died with colon cancer  . Crohn's disease Paternal Aunt     Social History Social History  Substance Use Topics  . Smoking status: Former Smoker    Packs/day: 0.12    Years: 4.00    Types: Cigarettes    Quit date: 10/05/1996  . Smokeless tobacco: Never Used     Comment: 08/22/2013 1 pack every two weeks when she did smoked  . Alcohol use No     Allergies   Zithromax [azithromycin]; Aspirin; Ibuprofen; and Sulfa antibiotics   Review of Systems Review of Systems A complete 10 system review of systems was obtained and all systems are negative except as noted in the HPI and PMH.   Physical Exam Updated Vital Signs BP 122/84 (BP Location: Left Arm)   Pulse 89   Temp 97.9 F (36.6 C) (Oral)   Resp 20   SpO2 100%   Physical Exam  Constitutional: She is oriented to person, place, and time. She appears well-developed and well-nourished. No distress.  HENT:  Head: Normocephalic and atraumatic.  Nose: Nose normal.  Mouth/Throat: Oropharynx is clear and moist. No  oropharyngeal exudate.  Eyes: Conjunctivae and EOM are normal. Pupils are equal, round, and reactive to light. No scleral icterus.  Neck: Normal range of motion. Neck supple. No JVD present. No tracheal deviation present. No thyromegaly present.  Cardiovascular: Normal rate, regular rhythm and normal heart sounds.  Exam reveals no gallop and no friction rub.   No murmur heard. Pulmonary/Chest: Effort normal and breath sounds normal. No respiratory distress. She has no wheezes. She exhibits no tenderness.  Abdominal: Soft. Bowel sounds are normal. She exhibits no distension and no mass. There is tenderness in the right upper quadrant and left upper quadrant. There is no rebound and no guarding.  Bilateral upper quadrant tenderness to palpation  Musculoskeletal: Normal range of motion. She exhibits no edema or tenderness.  BLE edema.  Lymphadenopathy:    She has no cervical adenopathy.  Neurological: She is alert and oriented to person, place, and time. No cranial nerve deficit. She exhibits normal muscle tone.  Skin: Skin is warm and dry. No rash noted. No erythema. No pallor.  Nursing note and vitals reviewed.    ED Treatments / Results  DIAGNOSTIC STUDIES: Oxygen Saturation is 98% on RA, nl by my interpretation.    COORDINATION OF CARE: 3:58 AM Discussed treatment plan with pt at bedside which includes labs, UA, and pt agreed to plan.   Labs (all labs ordered are listed, but only abnormal results are displayed) Labs Reviewed  COMPREHENSIVE METABOLIC PANEL - Abnormal; Notable for the following:       Result Value   Potassium 3.1 (*)    Glucose, Bld 117 (*)    Creatinine, Ser 1.27 (*)    Alkaline Phosphatase 130 (*)    GFR calc non Af Amer 42 (*)    GFR calc Af Amer 49 (*)    All other components within normal limits  CBC - Abnormal; Notable for the following:    WBC 23.6 (*)    Hemoglobin 10.7 (*)    HCT 35.2 (*)    MCH 24.7 (*)    RDW 16.0 (*)    Platelets 402 (*)    All  other components within normal limits  URINALYSIS, ROUTINE W REFLEX MICROSCOPIC - Abnormal; Notable for the following:    Color, Urine AMBER (*)    APPearance CLOUDY (*)    Hgb urine dipstick MODERATE (*)    Protein, ur 30 (*)    Leukocytes, UA LARGE (*)    Bacteria, UA MANY (*)    Squamous Epithelial / LPF 6-30 (*)    All other components within normal limits  LIPASE, BLOOD    EKG  EKG Interpretation None       Radiology Ct Abdomen Pelvis W Contrast  Result Date: 02/03/2017 CLINICAL DATA:  Acute onset of nausea, vomiting and diarrhea. Upper abdominal pain. Current history of Crohn's disease. Leukocytosis. Red blood cells and white blood cells in the urine. Initial encounter. EXAM: CT ABDOMEN AND PELVIS WITH CONTRAST TECHNIQUE: Multidetector CT imaging of the abdomen and pelvis was performed using the standard protocol following bolus administration of intravenous contrast. CONTRAST:  100 mL of Isovue 300 IV contrast COMPARISON:  CT of the abdomen and pelvis from 07/30/2015 FINDINGS: Lower chest: Atelectasis is noted about the patient's very large hiatal hernia. The visualized portions of the mediastinum unremarkable. The hernia contains a distended loop of small bowel, as well as much of the stomach. Hepatobiliary: The liver is unremarkable in appearance. The patient is status post cholecystectomy, with clips noted at the gallbladder fossa. The common bile duct remains normal in caliber. Pancreas: The pancreas is within normal limits. Spleen: The spleen is unremarkable in appearance. Adrenals/Urinary Tract: The adrenal glands are unremarkable in appearance. The kidneys are within normal limits. There is no evidence of hydronephrosis. No renal or ureteral stones are identified. No perinephric stranding is seen. Stomach/Bowel: The patient is status post resection of portions of the small bowel and colon. Remaining visualized small bowel loops are diffusely dilated, measuring up to 6.9 cm in  diameter. The colon is largely decompressed, with mild wall thickening along the ascending colon. This may correspond to the patient's Crohn's disease. The stomach is otherwise unremarkable. Vascular/Lymphatic: The abdominal aorta is unremarkable in appearance. The inferior vena cava is grossly unremarkable. No retroperitoneal lymphadenopathy is seen. No pelvic sidewall lymphadenopathy is identified. Reproductive: The bladder is mildly distended and grossly unremarkable. A small uterine fibroid is noted. The uterus is otherwise unremarkable. No suspicious adnexal masses are seen. Other: A small 2.4 x 0.6 cm collection of fluid and air along the right side of the anorectal canal raises concern for trace abscess. Diffuse soft tissue inflammation tracks near the gluteal cleft, with minimal fluid, concerning for perianal fistulas as previously noted. Musculoskeletal: No acute osseous abnormalities are identified. There is chronic compression deformity of vertebral body L1. The visualized musculature is unremarkable in appearance. IMPRESSION: 1. Diffuse dilatation of remaining small bowel loops, measuring up to 6.9 cm in diameter. The colon is largely decompressed, with mild irregular wall thickening along the ascending colon. Mild surrounding soft tissue inflammation seen. This may reflect exacerbation of the patient's Crohn's disease. Mass is considered less likely. 2. Very large hiatal hernia is again noted, containing much of the stomach and a distended segment of small bowel. 3. Perianal fistulas again noted, with a small 2.4 x 0.6 cm collection of fluid and air along the right side of the anorectal canal, concerning for trace abscess. Diffuse surrounding soft tissue inflammation tracking near the gluteal cleft. 4. 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 5. Small uterine fibroids seen. 6. Atelectasis about the very large hiatal hernia. Electronically  Signed   By: Garald Balding M.D.   On: 02/03/2017  06:22    Procedures Procedures (including critical care time)  Medications Ordered in ED Medications  iopamidol (ISOVUE-300) 61 % injection 30 mL (not administered)  cephALEXin (KEFLEX) capsule 500 mg (not administered)  sodium chloride 0.9 % bolus 1,000 mL (0 mLs Intravenous Stopped 02/03/17 0355)  fentaNYL (SUBLIMAZE) injection 100 mcg (100 mcg Intravenous Given 02/03/17 0412)  promethazine (PHENERGAN) injection 12.5 mg (12.5 mg Intravenous Given 02/03/17 0448)  potassium chloride SA (K-DUR,KLOR-CON) CR tablet 40 mEq (40 mEq Oral Given 02/03/17 0456)  iopamidol (ISOVUE-300) 61 % injection 100 mL (100 mLs Intravenous Contrast Given 02/03/17 0551)     Initial Impression / Assessment and Plan / ED Course  I have reviewed the triage vital signs and the nursing notes.  Pertinent labs & imaging results that were available during my care of the patient were reviewed by me and considered in my medical decision making (see chart for details).     Patient presents to the ED for BUQ abd pain.  She states this is different and worse than her normal Crohns flare.  She is requesting pain control and phenergan.  She was given fentanyl for pain. Will obtain CT scan as she has a history of multiple surgeries in the past.  Doubt SBO since she Is having diarrhea. This may be a severe gastroenteritis. Will continue to reevaluate.  6:36 AM CT reveals crohns flare with possible small abscesses around the rectum. Exam of this area elicits no pain and no fluctuance felt.  Will place patient on keflex for treatment of UTI.  PCP fu strongly advised within 3 days. She appears well and in NAD.  VS normal. Patient safe for DC.     Final Clinical Impressions(s) / ED Diagnoses   Final diagnoses:  None    New Prescriptions New Prescriptions   No medications on file   I personally performed the services described in this documentation, which was scribed in my presence. The recorded information has been  reviewed and is accurate.      Everlene Balls, MD 02/03/17 [phone]

## 2017-02-08 DIAGNOSIS — K50913 Crohn's disease, unspecified, with fistula: Secondary | ICD-10-CM | POA: Diagnosis not present

## 2017-02-08 DIAGNOSIS — L400 Psoriasis vulgaris: Secondary | ICD-10-CM | POA: Diagnosis not present

## 2017-02-08 DIAGNOSIS — D510 Vitamin B12 deficiency anemia due to intrinsic factor deficiency: Secondary | ICD-10-CM | POA: Diagnosis not present

## 2017-02-08 DIAGNOSIS — N183 Chronic kidney disease, stage 3 (moderate): Secondary | ICD-10-CM | POA: Diagnosis not present

## 2017-02-09 ENCOUNTER — Telehealth (INDEPENDENT_AMBULATORY_CARE_PROVIDER_SITE_OTHER): Payer: Self-pay | Admitting: *Deleted

## 2017-02-09 NOTE — Telephone Encounter (Signed)
Per Dr.rehman the patient will need to go to the ED for further evaluation of this flare. We are not able to call in pain medication, Patient was called and made aware.

## 2017-02-09 NOTE — Telephone Encounter (Signed)
Patient calls in today --returned call  She states last Wednesday she had to go to ER--flare of Crohns  They gave her IV for pain and IV for vomiting--she did feel better no meds given for pain since she takes them from Dr. Berdine Addison.  Now she is feeling bad again and is eating very little--no vomiting, no fever normal loose stools for her with no blood seen.  She is having terrible stomach cramps--since she was given nothing at ER for pain  She knows Dr. Berdine Addison will not refill until next Tuesday.  She really needs something.  [facility].

## 2017-02-10 ENCOUNTER — Telehealth (INDEPENDENT_AMBULATORY_CARE_PROVIDER_SITE_OTHER): Payer: Self-pay | Admitting: Internal Medicine

## 2017-02-10 NOTE — Telephone Encounter (Signed)
Patient called, stated that she needs to get an appointment with Dr. Laural Golden ASAP, she stated that Dr. Laural Golden stated that he wants to see her as soon as he can.  The patient has an appointment for 05/04/17 at 10:30am with Dr. Laural Golden.  Dr. Laural Golden, please advise if she should keep the appointment she has or if you need to see her sooner.  [phone]

## 2017-02-11 ENCOUNTER — Other Ambulatory Visit (INDEPENDENT_AMBULATORY_CARE_PROVIDER_SITE_OTHER): Payer: Self-pay | Admitting: Internal Medicine

## 2017-02-11 NOTE — Telephone Encounter (Signed)
Office visit next week

## 2017-02-12 NOTE — Telephone Encounter (Signed)
Sent a message to [name] to help try to find a spot to bring this patient in.

## 2017-02-18 ENCOUNTER — Encounter (INDEPENDENT_AMBULATORY_CARE_PROVIDER_SITE_OTHER): Payer: Self-pay | Admitting: Internal Medicine

## 2017-02-18 ENCOUNTER — Ambulatory Visit (INDEPENDENT_AMBULATORY_CARE_PROVIDER_SITE_OTHER): Payer: Medicare HMO | Admitting: Internal Medicine

## 2017-02-18 ENCOUNTER — Other Ambulatory Visit (INDEPENDENT_AMBULATORY_CARE_PROVIDER_SITE_OTHER): Payer: Self-pay | Admitting: *Deleted

## 2017-02-18 VITALS — BP 110/70 | HR 66 | Temp 99.0°F | Resp 18 | Ht 64.5 in | Wt 151.9 lb

## 2017-02-18 DIAGNOSIS — K219 Gastro-esophageal reflux disease without esophagitis: Secondary | ICD-10-CM

## 2017-02-18 DIAGNOSIS — K449 Diaphragmatic hernia without obstruction or gangrene: Secondary | ICD-10-CM | POA: Diagnosis not present

## 2017-02-18 DIAGNOSIS — K50813 Crohn's disease of both small and large intestine with fistula: Secondary | ICD-10-CM | POA: Diagnosis not present

## 2017-02-18 DIAGNOSIS — K50118 Crohn's disease of large intestine with other complication: Secondary | ICD-10-CM

## 2017-02-18 DIAGNOSIS — R21 Rash and other nonspecific skin eruption: Secondary | ICD-10-CM | POA: Diagnosis not present

## 2017-02-18 MED ORDER — NYSTATIN 100000 UNIT/GM EX CREA: 1.0000 "application " | TOPICAL_CREAM | Freq: Two times a day (BID) | CUTANEOUS | 1 refills | Status: DC | PRN

## 2017-02-18 MED ORDER — PROMETHAZINE HCL 25 MG PO TABS: 25.0000 mg | ORAL_TABLET | Freq: Two times a day (BID) | ORAL | 0 refills | Status: DC | PRN

## 2017-02-18 MED ORDER — NYSTATIN 100000 UNIT/GM EX POWD: CUTANEOUS | 2 refills | Status: DC

## 2017-02-18 NOTE — Patient Instructions (Addendum)
Colonoscopy and possible stricture dilation to be scheduled for near future. Report to [facility] ER if nausea vomiting and abdominal pain recur. Physician will call with results of blood tests(Remicade and  antibody levels). I will contact Dr. Leighton Ruff and request her to review your CT

## 2017-02-18 NOTE — Progress Notes (Signed)
Presenting complaint;  Follow-up for Crohn's disease.  Database and Subjective:  Patient is 71 year old African-American female who was history of ileocolonic/rectal Crohn disease and was last seen on 01/25/2017. She is on infliximab for maintenance. Following her last visit she was treated with Cipro and metronidazole for perianal disease. She has not been on prednisone for over 3 months. She thought she was doing well until 02/02/2017 when she developed abdominal cramping and nausea. She was seen in emergency room at [facility] on 02/03/2017. She underwent abdominopelvic CT which revealed dilated loops of small bowel and thickening with narrowing at ileocolonic anastomosis. There was also question of perianal fistula with small left no fluid in air along the right side of anorectal canal. She also had diffuse surrounding soft tissue inflammation tracking near the gluteal cleft. She also had large hiatal hernia chronic L1 compression and uterine fibroids. Urinalysis suggested urine tract infection. Serum potassium was 3.1. She was given IV fluids with KCl and discharged on Keflex. She developed nausea and vomiting after first dose of Keflex and did not take it. She states last week she had similar episode. She decided not to go to emergency room. She took a pain pill Bentyl  She denies abdominal pain. She is having 3-4 bowel movements per day. Stools are formed or semi-formed. She denies melena or rectal bleeding. She is not having rectal discharge. She also is not having gluteal or perirectal pain. Her appetite is normal and she has not lost any weight. She says heartburns well controlled with therapy. She has noted intermittent wheezing. She saw Dr. Berdine Addison and she was felt not to be in CHF. She is using albuterol inhaler on as-needed basis. She denies fever chills or night sweats.    Current Medications: Outpatient Encounter Prescriptions as of 02/18/2017  Medication Sig  . acetaminophen (TYLENOL) 500 MG  tablet Take 1,000 mg by mouth every 6 (six) hours as needed for mild pain or moderate pain.  [name] albuterol (PROVENTIL HFA;VENTOLIN HFA) 108 (90 Base) MCG/ACT inhaler Inhale 2 puffs into the lungs every 6 (six) hours as needed for wheezing.  . Calcium Carbonate-Vitamin D (CALCIUM + D PO) Take 1 tablet by mouth daily.  . Cyanocobalamin (VITAMIN B-12 IJ) Inject 1 each as directed every 30 (thirty) days. Around the 1st week of each month  . dicyclomine (BENTYL) 10 MG/5ML syrup TAKE 10 MLS (20 MG TOTAL) BY MOUTH 3 (THREE) TIMES DAILY AS NEEDED.  [name] diphenhydrAMINE (BENADRYL) 25 mg capsule Take 25 mg by mouth every 6 (six) hours as needed for itching.  . diphenoxylate-atropine (LOMOTIL) 2.5-0.025 MG tablet Take 1 tablet by mouth 4 (four) times daily as needed for diarrhea or loose stools.  . fluconazole (DIFLUCAN) 150 MG tablet TAKE 1 TABLET BY MOUTH NOW, THEN 7 DAYS LATER  . FLUZONE HIGH-DOSE 0.5 ML SUSY 0.5 mLs.   . furosemide (LASIX) 40 MG tablet Take 1 tablet (40 mg total) by mouth daily as needed for edema.  [name] HYDROcodone-acetaminophen (NORCO) 7.5-325 MG tablet Take 1 tablet by mouth every 4 (four) hours as needed for moderate pain.  [name] inFLIXimab (REMICADE) 100 MG injection Inject 100 mg into the vein every 6 (six) weeks.  . Multiple Vitamin (MULTIVITAMIN WITH MINERALS) TABS Take 1 tablet by mouth daily.  [name] nystatin cream (MYCOSTATIN) Apply 1 application topically 2 (two) times daily as needed for dry skin.  . NYSTATIN powder USE TO AFFECTED AREA TWICE DAILY  . pantoprazole (PROTONIX) 40 MG tablet TAKE 1 TABLET (40 MG  TOTAL) BY MOUTH 2 (TWO) TIMES DAILY BEFORE A MEAL.  [name] potassium chloride SA (K-DUR,KLOR-CON) 20 MEQ tablet Take 1 tablet (20 mEq total) by mouth daily as needed. Take potassium on days U take furosemide. (Patient taking differently: Take 20 mEq by mouth daily as needed. Take potassium on days while taking furosemide.)  . promethazine (PHENERGAN) 25 MG tablet TAKE 1 TABLET BY MOUTH TWICE A  DAY AS NEEDED  . spironolactone (ALDACTONE) 50 MG tablet Take 1 tablet (50 mg total) by mouth daily.  [name] triamcinolone cream (KENALOG) 0.1 % Apply 1 application topically 2 (two) times daily as needed.  . Vitamin D, Ergocalciferol, (DRISDOL) 50000 units CAPS capsule Take 1 capsule (50,000 Units total) by mouth every 7 (seven) days.  . ondansetron (ZOFRAN) 4 MG tablet Take 1 tablet (4 mg total) by mouth as needed for nausea or vomiting. Must 30 days before next refill (Patient not taking: Reported on 02/18/2017)  . [DISCONTINUED] cephALEXin (KEFLEX) 500 MG capsule Take 1 capsule (500 mg total) by mouth 2 (two) times daily. (Patient not taking: Reported on 02/18/2017)  . [DISCONTINUED] ciprofloxacin (CIPRO) 500 MG tablet Take 1 tablet (500 mg total) by mouth 2 (two) times daily.  . [DISCONTINUED] metroNIDAZOLE (FLAGYL) 250 MG tablet Take 1 tablet (250 mg total) by mouth 3 (three) times daily. (Patient not taking: Reported on 02/18/2017)   No facility-administered encounter medications on file as of 02/18/2017.      Objective: Blood pressure 110/70, pulse 66, temperature 99 F (37.2 C), temperature source Oral, resp. rate 18, height 5' 4.5" (1.638 m), weight 151 lb 14.4 oz (68.9 kg). Patient is alert and in no acute distress. Conjunctiva is pink. Sclera is nonicteric Oropharyngeal mucosa is normal. No neck masses or thyromegaly noted. Cardiac exam with regular rhythm with prominent S1 and normal S2. S2. No murmur or gallop noted. Lungs are clear to auscultation. Abdomen is full. 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 Bowel sounds are normal. On palpation abdomen is soft and nontender. Tilley examination was limited to external inspection. There is slight induration to the left of anal orifice. There is some edema in the skin but no excoriation or ulcer noted. No LE edema or clubbing noted.  Labs/studies Results:   CT images from 02/03/2017 reviewed alongside  patient.  Assessment:  #1. Crohn's disease. CT suggest wall thickening and narrowing at ileocolonic anastomosis. Therefore she could be having intermittent partial small bowel obstruction. This stricture may BM and able to endoscopic therapy. It is also possible that she has stopped responding to infliximab. Therefore need to check antibody levels along with trough level. #2. GERD. He has large hiatal hernia. Typical symptoms are well controlled. I do not believe her bronchospasm secondary to GERD. #3. Perianal rash secondary to this area staying moist. No evidence of perianal abscess on external examination. She will need to follow with Dr. Rush Landmark home she has seen for perianal disease before.  #4.Chronic anemia. Recent H&H was higher than it has been before and it may have been due to dehydration. #5. History of hypokalemia. Check serum potassium prior to colonoscopy.    Plan:  Diagnostic colonoscopy with possible ileocolonic stricture dilation. Prescription given for promethazine and Mycostatin. Patient advised to report to ER at [facility] if she develops abdominal pain and or vomiting. Will check infliximab trough and antibody levels. Office visit in 8 weeks.

## 2017-02-19 ENCOUNTER — Other Ambulatory Visit (INDEPENDENT_AMBULATORY_CARE_PROVIDER_SITE_OTHER): Payer: Self-pay | Admitting: Internal Medicine

## 2017-02-19 ENCOUNTER — Encounter (INDEPENDENT_AMBULATORY_CARE_PROVIDER_SITE_OTHER): Payer: Self-pay | Admitting: *Deleted

## 2017-02-19 ENCOUNTER — Telehealth (INDEPENDENT_AMBULATORY_CARE_PROVIDER_SITE_OTHER): Payer: Self-pay | Admitting: *Deleted

## 2017-02-19 DIAGNOSIS — K50918 Crohn's disease, unspecified, with other complication: Secondary | ICD-10-CM

## 2017-02-19 DIAGNOSIS — K5090 Crohn's disease, unspecified, without complications: Secondary | ICD-10-CM | POA: Insufficient documentation

## 2017-02-19 NOTE — Telephone Encounter (Signed)
Patient need trilyte 

## 2017-02-20 ENCOUNTER — Other Ambulatory Visit (INDEPENDENT_AMBULATORY_CARE_PROVIDER_SITE_OTHER): Payer: Self-pay | Admitting: Internal Medicine

## 2017-02-20 DIAGNOSIS — R21 Rash and other nonspecific skin eruption: Secondary | ICD-10-CM

## 2017-02-20 DIAGNOSIS — R197 Diarrhea, unspecified: Secondary | ICD-10-CM

## 2017-02-22 MED ORDER — PEG 3350-KCL-NA BICARB-NACL 420 G PO SOLR: 4000.0000 mL | Freq: Once | ORAL | 0 refills | Status: AC

## 2017-02-23 DIAGNOSIS — K6289 Other specified diseases of anus and rectum: Secondary | ICD-10-CM | POA: Diagnosis not present

## 2017-02-24 ENCOUNTER — Other Ambulatory Visit (INDEPENDENT_AMBULATORY_CARE_PROVIDER_SITE_OTHER): Payer: Self-pay | Admitting: Internal Medicine

## 2017-03-01 DIAGNOSIS — K50013 Crohn's disease of small intestine with fistula: Secondary | ICD-10-CM | POA: Diagnosis not present

## 2017-03-01 LAB — CBC
HCT: 33.4 % — ABNORMAL LOW (ref 35.0–45.0)
HEMOGLOBIN: 10.3 g/dL — AB (ref 11.7–15.5)
MCH: 25 pg — AB (ref 27.0–33.0)
MCHC: 30.8 g/dL — AB (ref 32.0–36.0)
MCV: 81.1 fL (ref 80.0–100.0)
MPV: 9.4 fL (ref 7.5–12.5)
Platelets: 395 10*3/uL (ref 140–400)
RBC: 4.12 MIL/uL (ref 3.80–5.10)
RDW: 16.6 % — AB (ref 11.0–15.0)
WBC: 16.4 10*3/uL — AB (ref 3.8–10.8)

## 2017-03-02 LAB — BASIC METABOLIC PANEL
BUN: 9 mg/dL (ref 7–25)
CALCIUM: 8.6 mg/dL (ref 8.6–10.4)
CO2: 15 mmol/L — ABNORMAL LOW (ref 20–31)
Chloride: 113 mmol/L — ABNORMAL HIGH (ref 98–110)
Creat: 1.37 mg/dL — ABNORMAL HIGH (ref 0.50–0.99)
GLUCOSE: 76 mg/dL (ref 65–99)
POTASSIUM: 3 mmol/L — AB (ref 3.5–5.3)
SODIUM: 140 mmol/L (ref 135–146)

## 2017-03-09 ENCOUNTER — Encounter (HOSPITAL_COMMUNITY): Payer: Self-pay

## 2017-03-09 ENCOUNTER — Encounter (HOSPITAL_COMMUNITY)
Admission: RE | Admit: 2017-03-09 | Discharge: 2017-03-09 | Disposition: A | Discharge status: Home / Self Care | Payer: Medicare HMO | Source: Ambulatory Visit | Attending: Internal Medicine | Admitting: Internal Medicine

## 2017-03-09 DIAGNOSIS — Z01818 Encounter for other preprocedural examination: Secondary | ICD-10-CM

## 2017-03-09 DIAGNOSIS — R1310 Dysphagia, unspecified: Secondary | ICD-10-CM | POA: Diagnosis present

## 2017-03-09 DIAGNOSIS — Z87891 Personal history of nicotine dependence: Secondary | ICD-10-CM | POA: Diagnosis not present

## 2017-03-09 DIAGNOSIS — R1314 Dysphagia, pharyngoesophageal phase: Secondary | ICD-10-CM | POA: Diagnosis not present

## 2017-03-09 DIAGNOSIS — Z980 Intestinal bypass and anastomosis status: Secondary | ICD-10-CM | POA: Diagnosis not present

## 2017-03-09 DIAGNOSIS — K5090 Crohn's disease, unspecified, without complications: Secondary | ICD-10-CM | POA: Diagnosis not present

## 2017-03-09 DIAGNOSIS — K449 Diaphragmatic hernia without obstruction or gangrene: Secondary | ICD-10-CM | POA: Diagnosis not present

## 2017-03-09 DIAGNOSIS — K50918 Crohn's disease, unspecified, with other complication: Secondary | ICD-10-CM

## 2017-03-09 DIAGNOSIS — K222 Esophageal obstruction: Secondary | ICD-10-CM | POA: Diagnosis not present

## 2017-03-09 DIAGNOSIS — Z1211 Encounter for screening for malignant neoplasm of colon: Secondary | ICD-10-CM | POA: Diagnosis not present

## 2017-03-09 DIAGNOSIS — K219 Gastro-esophageal reflux disease without esophagitis: Secondary | ICD-10-CM | POA: Diagnosis not present

## 2017-03-09 LAB — BASIC METABOLIC PANEL
ANION GAP: 7 (ref 5–15)
BUN: 9 mg/dL (ref 6–20)
CALCIUM: 8.8 mg/dL — AB (ref 8.9–10.3)
CO2: 16 mmol/L — AB (ref 22–32)
Chloride: 115 mmol/L — ABNORMAL HIGH (ref 101–111)
Creatinine, Ser: 1.19 mg/dL — ABNORMAL HIGH (ref 0.44–1.00)
GFR calc Af Amer: 53 mL/min — ABNORMAL LOW (ref >60)
GFR, EST NON AFRICAN AMERICAN: 45 mL/min — AB (ref >60)
GLUCOSE: 79 mg/dL (ref 65–99)
Potassium: 2.8 mmol/L — ABNORMAL LOW (ref 3.5–5.1)
Sodium: 138 mmol/L (ref 135–145)

## 2017-03-09 LAB — SURGICAL PCR SCREEN
MRSA, PCR: POSITIVE — AB
Staphylococcus aureus: POSITIVE — AB

## 2017-03-09 NOTE — Patient Instructions (Signed)
Danielle [name]  [birth year]     @PREFPERIOPPHARMACY @   Your procedure is scheduled on  03/12/2017   Report to [facility] at  76 A.M.  Call this number if you have problems the morning of surgery:  [phone]   Remember:  Do not eat food or drink liquids after midnight.  Take these medicines the morning of surgery with A SIP OF WATER  Hydrocodone, zofran, protonix, phenergan. Use your inhaler before you come.   Do not wear jewelry, make-up or nail polish.  Do not wear lotions, powders, or perfumes, or deoderant.  Do not shave 48 hours prior to surgery.  Men may shave face and neck.  Do not bring valuables to the hospital.  [facility] is not responsible for any belongings or valuables.  Contacts, dentures or bridgework may not be worn into surgery.  Leave your suitcase in the car.  After surgery it may be brought to your room.  For patients admitted to the hospital, discharge time will be determined by your treatment team.  Patients discharged the day of surgery will not be allowed to drive home.   Name and phone number of your driver:   family Special instructions:  Follow the diet and prep instructions given to you by Dr Olevia Perches office.  Please read over the following fact sheets that you were given. Anesthesia Post-op Instructions and Care and Recovery After Surgery       Colonoscopy, Adult A colonoscopy is an exam to look at the entire large intestine. During the exam, a lubricated, bendable tube is inserted into the anus and then passed into the rectum, colon, and other parts of the large intestine. A colonoscopy is often done as a part of normal colorectal screening or in response to certain symptoms, such as anemia, persistent diarrhea, abdominal pain, and blood in the stool. The exam can help screen for and diagnose medical problems, including:  Tumors.  Polyps.  Inflammation.  Areas of bleeding. Tell a health care provider about:  Any  allergies you have.  All medicines you are taking, including vitamins, herbs, eye drops, creams, and over-the-counter medicines.  Any problems you or family members have had with anesthetic medicines.  Any blood disorders you have.  Any surgeries you have had.  Any medical conditions you have.  Any problems you have had passing stool. What are the risks? Generally, this is a safe procedure. However, problems may occur, including:  Bleeding.  A tear in the intestine.  A reaction to medicines given during the exam.  Infection (rare). What happens before the procedure? Eating and drinking restrictions  Follow instructions from your health care provider about eating and drinking, which may include:  A few days before the procedure - follow a low-fiber diet. Avoid nuts, seeds, dried fruit, raw fruits, and vegetables.  1-3 days before the procedure - follow a clear liquid diet. Drink only clear liquids, such as clear broth or bouillon, black coffee or tea, clear juice, clear soft drinks or sports drinks, gelatin dessert, and popsicles. Avoid any liquids that contain red or purple dye.  On the day of the procedure - do not eat or drink anything during the 2 hours before the procedure, or within the time period that your health care provider recommends. Bowel prep  If you were prescribed an oral bowel prep to clean out your colon:  Take it as told by your health care provider. Starting  the day before your procedure, you will need to drink a large amount of medicated liquid. The liquid will cause you to have multiple loose stools until your stool is almost clear or light green.  If your skin or anus gets irritated from diarrhea, you may use these to relieve the irritation:  Medicated wipes, such as adult wet wipes with aloe and vitamin E.  A skin soothing-product like petroleum jelly.  If you vomit while drinking the bowel prep, take a break for up to 60 minutes and then begin the  bowel prep again. If vomiting continues and you cannot take the bowel prep without vomiting, call your health care provider. General instructions   Ask your health care provider about changing or stopping your regular medicines. This is especially important if you are taking diabetes medicines or blood thinners.  Plan to have someone take you home from the hospital or clinic. What happens during the procedure?  An IV tube may be inserted into one of your veins.  You will be given medicine to help you relax (sedative).  To reduce your risk of infection:  Your health care team will wash or sanitize their hands.  Your anal area will be washed with soap.  You will be asked to lie on your side with your knees bent.  Your health care provider will lubricate a long, thin, flexible tube. The tube will have a camera and a light on the end.  The tube will be inserted into your anus.  The tube will be gently eased through your rectum and colon.  Air will be delivered into your colon to keep it open. You may feel some pressure or cramping.  The camera will be used to take images during the procedure.  A small tissue sample may be removed from your body to be examined under a microscope (biopsy). If any potential problems are found, the tissue will be sent to a lab for testing.  If small polyps are found, your health care provider may remove them and have them checked for cancer cells.  The tube that was inserted into your anus will be slowly removed. The procedure may vary among health care providers and hospitals. What happens after the procedure?  Your blood pressure, heart rate, breathing rate, and blood oxygen level will be monitored until the medicines you were given have worn off.  Do not drive for 24 hours after the exam.  You may have a small amount of blood in your stool.  You may pass gas and have mild abdominal cramping or bloating due to the air that was used to inflate  your colon during the exam.  It is up to you to get the results of your procedure. Ask your health care provider, or the department performing the procedure, when your results will be ready. This information is not intended to replace advice given to you by your health care provider. Make sure you discuss any questions you have with your health care provider. Document Released: 11/20/2000 Document Revised: 09/23/2016 Document Reviewed: 02/04/2016 Elsevier Interactive Patient Education  2017 [employer]  Colonoscopy, Adult, Care After This sheet gives you information about how to care for yourself after your procedure. Your health care provider may also give you more specific instructions. If you have problems or questions, contact your health care provider. What can I expect after the procedure? After the procedure, it is common to have:  A small amount of blood in your stool for 24  hours after the procedure.  Some gas.  Mild abdominal cramping or bloating. Follow these instructions at home: General instructions    For the first 24 hours after the procedure:  Do not drive or use machinery.  Do not sign important documents.  Do not drink alcohol.  Do your regular daily activities at a slower pace than normal.  Eat soft, easy-to-digest foods.  Rest often.  Take over-the-counter or prescription medicines only as told by your health care provider.  It is up to you to get the results of your procedure. Ask your health care provider, or the department performing the procedure, when your results will be ready. Relieving cramping and bloating   Try walking around when you have cramps or feel bloated.  Apply heat to your abdomen as told by your health care provider. Use a heat source that your health care provider recommends, such as a moist heat pack or a heating pad.  Place a towel between your skin and the heat source.  Leave the heat on for 20-30 minutes.  Remove the  heat if your skin turns bright red. This is especially important if you are unable to feel pain, heat, or cold. You may have a greater risk of getting burned. Eating and drinking   Drink enough fluid to keep your urine clear or pale yellow.  Resume your normal diet as instructed by your health care provider. Avoid heavy or fried foods that are hard to digest.  Avoid drinking alcohol for as long as instructed by your health care provider. Contact a health care provider if:  You have blood in your stool 2-3 days after the procedure. Get help right away if:  You have more than a small spotting of blood in your stool.  You pass large blood clots in your stool.  Your abdomen is swollen.  You have nausea or vomiting.  You have a fever.  You have increasing abdominal pain that is not relieved with medicine. This information is not intended to replace advice given to you by your health care provider. Make sure you discuss any questions you have with your health care provider. Document Released: 07/07/2004 Document Revised: 08/17/2016 Document Reviewed: 02/04/2016 Elsevier Interactive Patient Education  2017 [place] Anesthesia is a term that refers to techniques, procedures, and medicines that help a person stay safe and comfortable during a medical procedure. Monitored anesthesia care, or sedation, is one type of anesthesia. Your anesthesia specialist may recommend sedation if you will be having a procedure that does not require you to be unconscious, such as:  Cataract surgery.  A dental procedure.  A biopsy.  A colonoscopy. During the procedure, you may receive a medicine to help you relax (sedative). There are three levels of sedation:  Mild sedation. At this level, you may feel awake and relaxed. You will be able to follow directions.  Moderate sedation. At this level, you will be sleepy. You may not remember the procedure.  Deep sedation. At  this level, you will be asleep. You will not remember the procedure. The more medicine you are given, the deeper your level of sedation will be. Depending on how you respond to the procedure, the anesthesia specialist may change your level of sedation or the type of anesthesia to fit your needs. An anesthesia specialist will monitor you closely during the procedure. Let your health care provider know about:  Any allergies you have.  All medicines you are taking, including vitamins,  herbs, eye drops, creams, and over-the-counter medicines.  Any use of steroids (by mouth or as a cream).  Any problems you or family members have had with sedatives and anesthetic medicines.  Any blood disorders you have.  Any surgeries you have had.  Any medical conditions you have, such as sleep apnea.  Whether you are pregnant or may be pregnant.  Any use of cigarettes, alcohol, or street drugs. What are the risks? Generally, this is a safe procedure. However, problems may occur, including:  Getting too much medicine (oversedation).  Nausea.  Allergic reaction to medicines.  Trouble breathing. If this happens, a breathing tube may be used to help with breathing. It will be removed when you are awake and breathing on your own.  Heart trouble.  Lung trouble. Before the procedure Staying hydrated  Follow instructions from your health care provider about hydration, which may include:  Up to 2 hours before the procedure - you may continue to drink clear liquids, such as water, clear fruit juice, black coffee, and plain tea. Eating and drinking restrictions  Follow instructions from your health care provider about eating and drinking, which may include:  8 hours before the procedure - stop eating heavy meals or foods such as meat, fried foods, or fatty foods.  6 hours before the procedure - stop eating light meals or foods, such as toast or cereal.  6 hours before the procedure - stop drinking  milk or drinks that contain milk.  2 hours before the procedure - stop drinking clear liquids. Medicines  Ask your health care provider about:  Changing or stopping your regular medicines. This is especially important if you are taking diabetes medicines or blood thinners.  Taking medicines such as aspirin and ibuprofen. These medicines can thin your blood. Do not take these medicines before your procedure if your health care provider instructs you not to. Tests and exams  You will have a physical exam.  You may have blood tests done to show:  How well your kidneys and liver are working.  How well your blood can clot.  General instructions  Plan to have someone take you home from the hospital or clinic.  If you will be going home right after the procedure, plan to have someone with you for 24 hours. What happens during the procedure?  Your blood pressure, heart rate, breathing, level of pain and overall condition will be monitored.  An IV tube will be inserted into one of your veins.  Your anesthesia specialist will give you medicines as needed to keep you comfortable during the procedure. This may mean changing the level of sedation.  The procedure will be performed. After the procedure  Your blood pressure, heart rate, breathing rate, and blood oxygen level will be monitored until the medicines you were given have worn off.  Do not drive for 24 hours if you received a sedative.  You may:  Feel sleepy, clumsy, or nauseous.  Feel forgetful about what happened after the procedure.  Have a sore throat if you had a breathing tube during the procedure.  Vomit. This information is not intended to replace advice given to you by your health care provider. Make sure you discuss any questions you have with your health care provider. Document Released: 08/19/2005 Document Revised: 05/01/2016 Document Reviewed: 03/15/2016 Elsevier Interactive Patient Education  2017 [place], Care After These instructions provide you with information about caring for yourself after your procedure. Your health care  provider may also give you more specific instructions. Your treatment has been planned according to current medical practices, but problems sometimes occur. Call your health care provider if you have any problems or questions after your procedure. What can I expect after the procedure? After your procedure, it is common to:  Feel sleepy for several hours.  Feel clumsy and have poor balance for several hours.  Feel forgetful about what happened after the procedure.  Have poor judgment for several hours.  Feel nauseous or vomit.  Have a sore throat if you had a breathing tube during the procedure. Follow these instructions at home: For at least 24 hours after the procedure:    Do not:  Participate in activities in which you could fall or become injured.  Drive.  Use heavy machinery.  Drink alcohol.  Take sleeping pills or medicines that cause drowsiness.  Make important decisions or sign legal documents.  Take care of children on your own.  Rest. Eating and drinking   Follow the diet that is recommended by your health care provider.  If you vomit, drink water, juice, or soup when you can drink without vomiting.  Make sure you have little or no nausea before eating solid foods. General instructions   Have a responsible adult stay with you until you are awake and alert.  Take over-the-counter and prescription medicines only as told by your health care provider.  If you smoke, do not smoke without supervision.  Keep all follow-up visits as told by your health care provider. This is important. Contact a health care provider if:  You keep feeling nauseous or you keep vomiting.  You feel light-headed.  You develop a rash.  You have a fever. Get help right away if:  You have trouble breathing. This  information is not intended to replace advice given to you by your health care provider. Make sure you discuss any questions you have with your health care provider. Document Released: 03/15/2016 Document Revised: 07/15/2016 Document Reviewed: 03/15/2016 Elsevier Interactive Patient Education  2017 [employer].

## 2017-03-10 ENCOUNTER — Other Ambulatory Visit (INDEPENDENT_AMBULATORY_CARE_PROVIDER_SITE_OTHER): Payer: Self-pay | Admitting: Internal Medicine

## 2017-03-10 DIAGNOSIS — R21 Rash and other nonspecific skin eruption: Secondary | ICD-10-CM

## 2017-03-10 MED ORDER — MUPIROCIN 2 % EX OINT
TOPICAL_OINTMENT | CUTANEOUS | Status: AC
  Filled 2017-03-10: qty 22

## 2017-03-10 NOTE — Progress Notes (Signed)
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  Advised she would have to start Mupirocin treatment, however cannot pick up, as she lives in [place].  Will start treatment day of procedure.

## 2017-03-11 ENCOUNTER — Encounter (HOSPITAL_COMMUNITY): Admission: RE | Admit: 2017-03-11 | Payer: Medicare HMO | Source: Ambulatory Visit

## 2017-03-12 ENCOUNTER — Ambulatory Visit (HOSPITAL_COMMUNITY): Payer: Medicare HMO | Admitting: Anesthesiology

## 2017-03-12 ENCOUNTER — Other Ambulatory Visit (INDEPENDENT_AMBULATORY_CARE_PROVIDER_SITE_OTHER): Payer: Self-pay | Admitting: Internal Medicine

## 2017-03-12 ENCOUNTER — Encounter (HOSPITAL_COMMUNITY): Payer: Self-pay | Admitting: *Deleted

## 2017-03-12 ENCOUNTER — Ambulatory Visit (HOSPITAL_COMMUNITY)
Admission: RE | Admit: 2017-03-12 | Discharge: 2017-03-12 | Disposition: A | Discharge status: Home / Self Care | Payer: Medicare HMO | Source: Ambulatory Visit | Attending: Internal Medicine | Admitting: Internal Medicine

## 2017-03-12 ENCOUNTER — Encounter (HOSPITAL_COMMUNITY)
Admission: RE | Disposition: A | Discharge status: Home / Self Care | Payer: Self-pay | Source: Ambulatory Visit | Attending: Internal Medicine

## 2017-03-12 DIAGNOSIS — R1310 Dysphagia, unspecified: Secondary | ICD-10-CM | POA: Diagnosis not present

## 2017-03-12 DIAGNOSIS — Z980 Intestinal bypass and anastomosis status: Secondary | ICD-10-CM | POA: Insufficient documentation

## 2017-03-12 DIAGNOSIS — R1314 Dysphagia, pharyngoesophageal phase: Secondary | ICD-10-CM

## 2017-03-12 DIAGNOSIS — K219 Gastro-esophageal reflux disease without esophagitis: Secondary | ICD-10-CM | POA: Insufficient documentation

## 2017-03-12 DIAGNOSIS — Z1211 Encounter for screening for malignant neoplasm of colon: Secondary | ICD-10-CM | POA: Insufficient documentation

## 2017-03-12 DIAGNOSIS — Z87891 Personal history of nicotine dependence: Secondary | ICD-10-CM | POA: Diagnosis not present

## 2017-03-12 DIAGNOSIS — K449 Diaphragmatic hernia without obstruction or gangrene: Secondary | ICD-10-CM

## 2017-03-12 DIAGNOSIS — K50919 Crohn's disease, unspecified, with unspecified complications: Secondary | ICD-10-CM | POA: Diagnosis not present

## 2017-03-12 DIAGNOSIS — K222 Esophageal obstruction: Secondary | ICD-10-CM

## 2017-03-12 DIAGNOSIS — K5090 Crohn's disease, unspecified, without complications: Secondary | ICD-10-CM | POA: Diagnosis not present

## 2017-03-12 DIAGNOSIS — K5080 Crohn's disease of both small and large intestine without complications: Secondary | ICD-10-CM | POA: Diagnosis not present

## 2017-03-12 DIAGNOSIS — K50918 Crohn's disease, unspecified, with other complication: Secondary | ICD-10-CM

## 2017-03-12 DIAGNOSIS — Z09 Encounter for follow-up examination after completed treatment for conditions other than malignant neoplasm: Secondary | ICD-10-CM | POA: Diagnosis not present

## 2017-03-12 DIAGNOSIS — K6289 Other specified diseases of anus and rectum: Secondary | ICD-10-CM | POA: Diagnosis not present

## 2017-03-12 HISTORY — PX: ESOPHAGOGASTRODUODENOSCOPY (EGD) WITH PROPOFOL: SHX5813

## 2017-03-12 HISTORY — PX: COLONOSCOPY WITH PROPOFOL: SHX5780

## 2017-03-12 HISTORY — PX: ESOPHAGEAL DILATION: SHX303

## 2017-03-12 LAB — POCT I-STAT 4, (NA,K, GLUC, HGB,HCT)
GLUCOSE: 74 mg/dL (ref 65–99)
HCT: 35 % — ABNORMAL LOW (ref 36.0–46.0)
Hemoglobin: 11.9 g/dL — ABNORMAL LOW (ref 12.0–15.0)
Potassium: 4.1 mmol/L (ref 3.5–5.1)
Sodium: 141 mmol/L (ref 135–145)

## 2017-03-12 SURGERY — COLONOSCOPY WITH PROPOFOL
Anesthesia: Monitor Anesthesia Care

## 2017-03-12 MED ORDER — POTASSIUM CHLORIDE CRYS ER 20 MEQ PO TBCR: 20.0000 meq | EXTENDED_RELEASE_TABLET | Freq: Every day | ORAL | 1 refills | Status: DC

## 2017-03-12 MED ORDER — DEXTROSE 50 % IV SOLN
INTRAVENOUS | Status: AC
  Filled 2017-03-12: qty 50

## 2017-03-12 MED ORDER — MIDAZOLAM HCL 2 MG/2ML IJ SOLN
1.0000 mg | INTRAMUSCULAR | Status: AC
Start: 2017-03-12 — End: 2017-03-12
  Administered 2017-03-12: 2 mg via INTRAVENOUS

## 2017-03-12 MED ORDER — FENTANYL CITRATE (PF) 100 MCG/2ML IJ SOLN
INTRAMUSCULAR | Status: AC
  Filled 2017-03-12: qty 2

## 2017-03-12 MED ORDER — PROPOFOL 10 MG/ML IV BOLUS
INTRAVENOUS | Status: AC
  Filled 2017-03-12: qty 40

## 2017-03-12 MED ORDER — CHLORHEXIDINE GLUCONATE CLOTH 2 % EX PADS: 6.0000 | MEDICATED_PAD | Freq: Once | CUTANEOUS | Status: DC

## 2017-03-12 MED ORDER — LIDOCAINE VISCOUS 2 % MT SOLN
3.0000 mL | Freq: Two times a day (BID) | OROMUCOSAL | Status: DC | PRN
  Administered 2017-03-12: 3 mL via OROMUCOSAL

## 2017-03-12 MED ORDER — FENTANYL CITRATE (PF) 100 MCG/2ML IJ SOLN
25.0000 ug | INTRAMUSCULAR | Status: AC
  Administered 2017-03-12 (×2): 25 ug via INTRAVENOUS

## 2017-03-12 MED ORDER — LACTATED RINGERS IV SOLN
INTRAVENOUS | Status: DC
  Administered 2017-03-12: 1000 mL via INTRAVENOUS
  Administered 2017-03-12: 11:00:00 via INTRAVENOUS

## 2017-03-12 MED ORDER — PROPOFOL 500 MG/50ML IV EMUL
INTRAVENOUS | Status: DC | PRN
  Administered 2017-03-12: 100 ug/kg/min via INTRAVENOUS

## 2017-03-12 MED ORDER — PROMETHAZINE HCL 25 MG PO TABS: 25.0000 mg | ORAL_TABLET | Freq: Two times a day (BID) | ORAL | 0 refills | Status: DC | PRN

## 2017-03-12 MED ORDER — MIDAZOLAM HCL 2 MG/2ML IJ SOLN
INTRAMUSCULAR | Status: AC
  Filled 2017-03-12: qty 2

## 2017-03-12 MED ORDER — LIDOCAINE VISCOUS 2 % MT SOLN
OROMUCOSAL | Status: AC
Start: 2017-03-12 — End: ?
  Filled 2017-03-12: qty 15

## 2017-03-12 MED ORDER — DEXTROSE 50 % IV SOLN
25.0000 mL | Freq: Once | INTRAVENOUS | Status: AC
  Administered 2017-03-12: 25 mL via INTRAVENOUS

## 2017-03-12 NOTE — Anesthesia Preprocedure Evaluation (Signed)
Anesthesia Evaluation  Patient identified by MRN, date of birth, ID band Patient awake    Reviewed: Allergy & Precautions, NPO status , Patient's Chart, lab work & pertinent test results  Airway Mallampati: I  TM Distance: >3 FB Neck ROM: Full    Dental  (+) Dental Advisory Given, Edentulous Upper, Edentulous Lower   Pulmonary former smoker,    Pulmonary exam normal        Cardiovascular + angina Normal cardiovascular exam  (GERD)   Neuro/Psych    GI/Hepatic Neg liver ROS, hiatal hernia (dysphagia), GERD  Medicated and Controlled,Crohns disease   Endo/Other  negative endocrine ROS  Renal/GU Renal InsufficiencyRenal disease     Musculoskeletal   Abdominal   Peds  Hematology negative hematology ROS (+) anemia , H?H 7.8/23.6   Anesthesia Other Findings   Reproductive/Obstetrics                             Anesthesia Physical Anesthesia Plan  ASA: III  Anesthesia Plan: MAC   Post-op Pain Management:    Induction: Intravenous  Airway Management Planned: Simple Face Mask  Additional Equipment:   Intra-op Plan:   Post-operative Plan:   Informed Consent: I have reviewed the patients History and Physical, chart, labs and discussed the procedure including the risks, benefits and alternatives for the proposed anesthesia with the patient or authorized representative who has indicated his/her understanding and acceptance.     Plan Discussed with:   Anesthesia Plan Comments:         Anesthesia Quick Evaluation

## 2017-03-12 NOTE — H&P (Signed)
Danielle [name] is an 71 y.o. female.   Chief Complaint: Patient is here for EGD, ED and colonoscopy. HPI: Patient is 71 year old African-American female with chronic GERD known large hiatal hernia who presents with few weeks history of intermittent solid food dysphagia. She points to suprasternal area as the site of bolus obstruction. She has no difficulty with liquids. Heartburn is well controlled with PPI. She had her esophagus dilated several years ago with symptomatic relief. Patient has history of ileal colonic Crohn's disease and is undergoing colonoscopy to assess disease activity and also for surveillance. She has had recurrent perianal symptoms. He is presently on infliximab infusion for maintenance. Last colonoscopy was 5 years ago. Danielle [name] history is negative for CRC.  Past Medical History:  Diagnosis Date  . Crohn disease ([place])   . Hiatal hernia 12/07/2015  . Psoriasis     Past Surgical History:  Procedure Laterality Date  . ANAL STENOSIS  ~ 2010  . APPENDECTOMY  1980's  . BOWEL RESECTION  2009  . CHOLECYSTECTOMY  1990's  . COLONOSCOPY  2013  . COLOSTOMY  2009   DEMASO  . COLOSTOMY TAKEDOWN  2009   "only had it for about 1 month" (08/22/2013)  . ESOPHAGOGASTRODUODENOSCOPY  11/11/2011   Procedure: ESOPHAGOGASTRODUODENOSCOPY (EGD);  Surgeon: Missy Sabins, MD;  Location: [facility] ENDOSCOPY;  Service: Endoscopy;  Laterality: N/A;  . ESOPHAGOGASTRODUODENOSCOPY  05/18/2012   Procedure: ESOPHAGOGASTRODUODENOSCOPY (EGD);  Surgeon: Rogene Houston, MD;  Location: [facility] ENDO SUITE;  Service: Endoscopy;  Laterality: N/A;  200  . FLEXIBLE SIGMOIDOSCOPY  11/11/2011   Procedure: FLEXIBLE SIGMOIDOSCOPY;  Surgeon: Missy Sabins, MD;  Location: [place];  Service: Endoscopy;  Laterality: N/A;  . goiter  1999  . LEFT HEART CATHETERIZATION WITH CORONARY ANGIOGRAM N/A 07/09/2014   Procedure: LEFT HEART CATHETERIZATION WITH CORONARY ANGIOGRAM;  Surgeon: Peter M Martinique, MD;  Location: [facility] CATH LAB;  Service:  Cardiovascular;  Laterality: N/A;  . PROCTOSCOPY N/A 01/31/2015   Procedure: RIGID PROCTOSCOPY;  Surgeon: Leighton Ruff, MD;  Location: [place];  Service: General;  Laterality: N/A;  . RECTAL EXAM UNDER ANESTHESIA N/A 01/31/2015   Procedure: RECTAL EXAM UNDER ANESTHESIA WITH PLACEMENT OF A SETON;  Surgeon: Leighton Ruff, MD;  Location: [facility] OR;  Service: General;  Laterality: N/A;    Family History  Problem Relation Age of Onset  . Diabetes Mother   . Heart disease Father   . Healthy Sister   . Hypertension Brother   . Colon cancer Brother     died with colon cancer  . Crohn's disease Paternal Aunt    Social History:  reports that she quit smoking about 20 years ago. Her smoking use included Cigarettes. She has a 0.48 pack-year smoking history. She has never used smokeless tobacco. She reports that she does not drink alcohol or use drugs.  Allergies:  Allergies  Allergen Reactions  . Zithromax [Azithromycin] Swelling    Patient states that she has a swollen mouth/ blisters, yeast present mouth and vaginia.  . Aspirin Other (See Comments)    Cramps her stomach  . Ibuprofen Other (See Comments)    Patient preference  . Sulfa Antibiotics Nausea And Vomiting    Medications Prior to Admission  Medication Sig Dispense Refill  . acetaminophen (TYLENOL) 500 MG tablet Take 1,000 mg by mouth every 6 (six) hours as needed for mild pain or moderate pain.    [name] albuterol (PROVENTIL HFA;VENTOLIN HFA) 108 (90 Base) MCG/ACT inhaler Inhale 2 puffs into the  lungs every 6 (six) hours as needed for wheezing. 1 Inhaler 2  . Calcium Carbonate-Vitamin D (CALCIUM + D PO) Take 1 tablet by mouth daily.    . Cyanocobalamin (VITAMIN B-12 IJ) Inject 1 each as directed every 30 (thirty) days. Around the 1st week of each month    . dicyclomine (BENTYL) 10 MG/5ML syrup TAKE 10 MLS (20 MG TOTAL) BY MOUTH 3 (THREE) TIMES DAILY AS NEEDED. (Patient taking differently: Take 20 mg by mouth 3 (three) times daily as needed. )  300 mL 3  . diphenoxylate-atropine (LOMOTIL) 2.5-0.025 MG tablet TAKE 1 TABLET BY MOUTH 4 TIMES A DAY AS NEEDED FOR DIARRHEA OR LOOSE STOOLS 60 tablet 0  . furosemide (LASIX) 40 MG tablet Take 1 tablet (40 mg total) by mouth daily as needed for edema. 30 tablet 1  . HYDROcodone-acetaminophen (NORCO) 7.5-325 MG tablet Take 1 tablet by mouth every 4 (four) hours as needed for moderate pain. 6 tablet 0  . inFLIXimab (REMICADE) 100 MG injection Inject 100 mg into the vein every 6 (six) weeks. Next is due 03-11-2017    . Multiple Vitamin (MULTIVITAMIN WITH MINERALS) TABS Take 1 tablet by mouth daily.    [name] nystatin (NYSTATIN) powder USE TO AFFECTED AREA TWICE DAILY (Patient taking differently: Apply 1 g topically 2 (two) times daily as needed. USE TO AFFECTED FOR ITCHING) 60 g 2  . nystatin cream (MYCOSTATIN) Apply 1 application topically 2 (two) times daily as needed for dry skin. 30 g 1  . ondansetron (ZOFRAN) 4 MG tablet Take 1 tablet (4 mg total) by mouth as needed for nausea or vomiting. Must 30 days before next refill (Patient taking differently: Take 4 mg by mouth daily as needed for nausea or vomiting. Must 30 days before next refill) 30 tablet 5  . pantoprazole (PROTONIX) 40 MG tablet TAKE 1 TABLET (40 MG TOTAL) BY MOUTH 2 (TWO) TIMES DAILY BEFORE A MEAL. 180 tablet 3  . potassium chloride SA (K-DUR,KLOR-CON) 20 MEQ tablet Take 1 tablet (20 mEq total) by mouth daily as needed. Take potassium on days U take furosemide. (Patient taking differently: Take 40 mEq by mouth daily as needed. Take potassium on days while taking furosemide.) 30 tablet 1  . promethazine (PHENERGAN) 25 MG tablet Take 1 tablet (25 mg total) by mouth 2 (two) times daily as needed. (Patient taking differently: Take 25 mg by mouth 2 (two) times daily as needed for nausea or vomiting. ) 20 tablet 0  . spironolactone (ALDACTONE) 50 MG tablet Take 1 tablet (50 mg total) by mouth daily. 60 tablet 5  . triamcinolone cream (KENALOG) 0.1 %  APPLY TO AFFECTED AREA TWICE A DAY AS NEEDED 30 g 1  . Vitamin D, Ergocalciferol, (DRISDOL) 50000 units CAPS capsule Take 1 capsule (50,000 Units total) by mouth every 7 (seven) days. (Patient taking differently: Take 50,000 Units by mouth every 7 (seven) days. On Mondays) 24 capsule 0  . fluconazole (DIFLUCAN) 150 MG tablet TAKE 1 TABLET BY MOUTH NOW, THEN 7 DAYS LATER (Patient not taking: Reported on 03/08/2017) 2 tablet 1    Results for orders placed or performed during the hospital encounter of 03/12/17 (from the past 48 hour(s))  I-STAT 4, (NA,K, GLUC, HGB,HCT)     Status: Abnormal   Collection Time: 03/12/17 10:01 AM  Result Value Ref Range   Sodium 141 135 - 145 mmol/L   Potassium 4.1 3.5 - 5.1 mmol/L   Glucose, Bld 74 65 - 99 mg/dL  HCT 35.0 (L) 36.0 - 46.0 %   Hemoglobin 11.9 (L) 12.0 - 15.0 g/dL   No results found.  ROS  Blood pressure 107/61, temperature 97.8 F (36.6 C), resp. rate (!) 24, SpO2 100 %. Physical Exam  Constitutional: She appears well-developed and well-nourished.  HENT:  Mouth/Throat: Oropharynx is clear and moist.  Eyes: Conjunctivae are normal. No scleral icterus.  Neck: No thyromegaly present.  Cardiovascular: Normal rate, regular rhythm and normal heart sounds.   No murmur heard. Respiratory: Effort normal and breath sounds normal.  GI:  Abdomen is symmetrical. Lower midline and left lower quadrant scar. Abdomen is soft and nontender without organomegaly or masses.  Musculoskeletal: She exhibits no edema.  Lymphadenopathy:    She has no cervical adenopathy.  Neurological: She is alert.  Skin: Skin is warm and dry.     Assessment/Plan Solid food dysphagia in patient with chronic GERD. Ileal and colonic/anorectal Crohn's disease. EGD with ED and surveillance colonoscopy  Hildred Laser, MD 03/12/2017, 10:46 AM

## 2017-03-12 NOTE — Anesthesia Postprocedure Evaluation (Signed)
Anesthesia Post Note  Patient: ADALEENA MOOERS  Procedure(s) Performed: Procedure(s) (LRB): COLONOSCOPY WITH PROPOFOL (N/A) ESOPHAGOGASTRODUODENOSCOPY (EGD) WITH PROPOFOL (N/A) ESOPHAGEAL DILATION (N/A)  Patient location during evaluation: PACU Anesthesia Type: MAC Level of consciousness: awake and alert, oriented and patient cooperative Pain management: pain level controlled Vital Signs Assessment: post-procedure vital signs reviewed and stable Respiratory status: spontaneous breathing, nonlabored ventilation and respiratory function stable Cardiovascular status: blood pressure returned to baseline Postop Assessment: no headache, adequate PO intake and no signs of nausea or vomiting Anesthetic complications: no     Last Vitals:  Vitals:   03/12/17 1045 03/12/17 1050  BP: 115/67 106/68  Resp: (!) 37 (!) 27  Temp:      Last Pain: There were no vitals filed for this visit.               Kohen Reither

## 2017-03-12 NOTE — Addendum Note (Signed)
Addendum  created 03/12/17 1329 by Charmaine Downs, CRNA   Charge Capture section accepted

## 2017-03-12 NOTE — Discharge Instructions (Signed)
Resume usual medications except potassium; take 20 mEq by mouth daily and take extra dose on days she have to take furosemide. No driving for 24 hours. Office visit in one month(already scheduled)   Colonoscopy, Adult, Care After This sheet gives you information about how to care for yourself after your procedure. Your health care provider may also give you more specific instructions. If you have problems or questions, contact your health care provider. What can I expect after the procedure? After the procedure, it is common to have:  A small amount of blood in your stool for 24 hours after the procedure.  Some gas.  Mild abdominal cramping or bloating. Follow these instructions at home: General instructions    For the first 24 hours after the procedure:  Do not drive or use machinery.  Do not sign important documents.  Do not drink alcohol.  Do your regular daily activities at a slower pace than normal.  Eat soft, easy-to-digest foods.  Rest often.  Take over-the-counter or prescription medicines only as told by your health care provider.  It is up to you to get the results of your procedure. Ask your health care provider, or the department performing the procedure, when your results will be ready. Relieving cramping and bloating   Try walking around when you have cramps or feel bloated.  Apply heat to your abdomen as told by your health care provider. Use a heat source that your health care provider recommends, such as a moist heat pack or a heating pad.  Place a towel between your skin and the heat source.  Leave the heat on for 20-30 minutes.  Remove the heat if your skin turns bright red. This is especially important if you are unable to feel pain, heat, or cold. You may have a greater risk of getting burned. Eating and drinking   Drink enough fluid to keep your urine clear or pale yellow.  Resume your normal diet as instructed by your health care provider.  Avoid heavy or fried foods that are hard to digest.  Avoid drinking alcohol for as long as instructed by your health care provider. Contact a health care provider if:  You have blood in your stool 2-3 days after the procedure. Get help right away if:  You have more than a small spotting of blood in your stool.  You pass large blood clots in your stool.  Your abdomen is swollen.  You have nausea or vomiting.  You have a fever.  You have increasing abdominal pain that is not relieved with medicine. This information is not intended to replace advice given to you by your health care provider. Make sure you discuss any questions you have with your health care provider. Document Released: 07/07/2004 Document Revised: 08/17/2016 Document Reviewed: 02/04/2016 Elsevier Interactive Patient Education  2017 [employer].  Esophagogastroduodenoscopy, Care After Refer to this sheet in the next few weeks. These instructions provide you with information about caring for yourself after your procedure. Your health care provider may also give you more specific instructions. Your treatment has been planned according to current medical practices, but problems sometimes occur. Call your health care provider if you have any problems or questions after your procedure. What can I expect after the procedure? After the procedure, it is common to have:  A sore throat.  Nausea.  Bloating.  Dizziness.  Fatigue. Follow these instructions at home:  Do not eat or drink anything until the numbing medicine (local anesthetic) has worn  off and your gag reflex has returned. You will know that the local anesthetic has worn off when you can swallow comfortably.  Do not drive for 24 hours if you received a medicine to help you relax (sedative).  If your health care provider took a tissue sample for testing during the procedure, make sure to get your test results. This is your responsibility. Ask your health care  provider or the department performing the test when your results will be ready.  Keep all follow-up visits as told by your health care provider. This is important. Contact a health care provider if:  You cannot stop coughing.  You are not urinating.  You are urinating less than usual. Get help right away if:  You have trouble swallowing.  You cannot eat or drink.  You have throat or chest pain that gets worse.  You are dizzy or light-headed.  You faint.  You have nausea or vomiting.  You have chills.  You have a fever.  You have severe abdominal pain.  You have black, tarry, or bloody stools. This information is not intended to replace advice given to you by your health care provider. Make sure you discuss any questions you have with your health care provider. Document Released: 11/09/2012 Document Revised: 04/30/2016 Document Reviewed: 10/17/2015 Elsevier Interactive Patient Education  2017 [employer].

## 2017-03-12 NOTE — Transfer of Care (Signed)
Immediate Anesthesia Transfer of Care Note  Patient: Danielle [name]  Procedure(s) Performed: Procedure(s) with comments: COLONOSCOPY WITH PROPOFOL (N/A) - 2:05-moved up to 1025 per Lelon Frohlich ESOPHAGOGASTRODUODENOSCOPY (EGD) WITH PROPOFOL (N/A) ESOPHAGEAL DILATION (N/A)  Patient Location: PACU  Anesthesia Type:MAC  Level of Consciousness: awake, alert , oriented and patient cooperative  Airway & Oxygen Therapy: Patient Spontanous Breathing and Patient connected to nasal cannula oxygen  Post-op Assessment: Report given to RN and Post -op Vital signs reviewed and stable  Post vital signs: Reviewed and stable  Last Vitals:  Vitals:   03/12/17 1045 03/12/17 1050  BP: 115/67 106/68  Resp: (!) 37 (!) 27  Temp:      Last Pain: There were no vitals filed for this visit.       Complications: No apparent anesthesia complications

## 2017-03-12 NOTE — Op Note (Signed)
[facility] Patient Name: Danielle [name] Procedure Date: 03/12/2017 10:57 AM MRN: [id number] Date of Birth: 23-Sep-1946 Attending MD: Hildred Laser , MD CSN: [id number] Age: 71 Admit Type: Outpatient Procedure:                Upper GI endoscopy Indications:              Esophageal dysphagia, Gastro-esophageal reflux                            disease Providers:                Hildred Laser, MD, Otis Peak B. Sharon Seller, RN, Sherlyn Lees, Technician Referring MD:             Barrie Folk. Hill, MD Medicines:                Cetacaine spray, Propofol per Anesthesia Complications:            No immediate complications. Estimated Blood Loss:     Estimated blood loss was minimal. Procedure:                Pre-Anesthesia Assessment:                           - Prior to the procedure, a History and Physical                            was performed, and patient medications and                            allergies were reviewed. The patient's tolerance of                            previous anesthesia was also reviewed. The risks                            and benefits of the procedure and the sedation                            options and risks were discussed with the patient.                            All questions were answered, and informed consent                            was obtained. Prior Anticoagulants: The patient has                            taken no previous anticoagulant or antiplatelet                            agents. ASA Grade Assessment: III - A patient with  severe systemic disease. After reviewing the risks                            and benefits, the patient was deemed in                            satisfactory condition to undergo the procedure.                           After obtaining informed consent, the endoscope was                            passed under direct vision. Throughout the   procedure, the patient's blood pressure, pulse, and                            oxygen saturations were monitored continuously. The                            [id number]) scope was introduced through the                            mouth, and advanced to the second part of duodenum.                            The upper GI endoscopy was accomplished without                            difficulty. The patient tolerated the procedure                            well. Scope In: 11:01:50 AM Scope Out: 11:13:42 AM Total Procedure Duration: 0 hours 11 minutes 52 seconds  Findings:      The examined esophagus was normal.      A moderate Schatzki ring (acquired) was found at the gastroesophageal       junction. A TTS dilator was passed through the scope. Dilation with a       15-16.5-18 mm balloon dilator was performed to 15 mm, 16.5 mm and 18 mm.       The dilation site was examined and showed moderate improvement in       luminal narrowing.      A large hiatal hernia was present.      The entire examined stomach was normal.      The duodenal bulb and second portion of the duodenum were normal. Impression:               - Normal esophagus.                           - Moderate Schatzki ring. Dilated with balloonas                            Maloney dilator could not be advanced through  tortuous esophagus.                           - Large hiatal hernia.                           - Normal stomach.                           - Normal duodenal bulb and second portion of the                            duodenum.                           - No specimens collected. Moderate Sedation:      Per Anesthesia Care Recommendation:           - Patient has a contact number available for                            emergencies. The signs and symptoms of potential                            delayed complications were discussed with the                            patient. Return to  normal activities tomorrow.                            Written discharge instructions were provided to the                            patient.                           - Resume previous diet today.                           - Continue present medications. Procedure Code(s):        --- Professional ---                           [phone], Esophagogastroduodenoscopy, flexible,                            transoral; with transendoscopic balloon dilation of                            esophagus (less than 30 mm diameter) Diagnosis Code(s):        --- Professional ---                           K22.2, Esophageal obstruction                           K44.9, Diaphragmatic hernia without obstruction or  gangrene                           R13.14, Dysphagia, pharyngoesophageal phase                           K21.9, Gastro-esophageal reflux disease without                            esophagitis CPT copyright 2016 [employer]. All rights reserved. The codes documented in this report are preliminary and upon coder review may  be revised to meet current compliance requirements. Hildred Laser, MD Hildred Laser, MD 03/12/2017 11:50:02 AM This report has been signed electronically. Number of Addenda: 0

## 2017-03-12 NOTE — Telephone Encounter (Signed)
Promethazine prescription sent to patient's pharmacy.

## 2017-03-12 NOTE — Op Note (Signed)
[facility] Patient Name: Danielle [name] Procedure Date: 03/12/2017 11:15 AM MRN: [id number] Date of Birth: 24-Apr-1946 Attending MD: Hildred Laser , MD CSN: [id number] Age: 71 Admit Type: Outpatient Procedure:                Colonoscopy Indications:              High risk colon cancer surveillance: Crohn's small                            and large intestine Providers:                Hildred Laser, MD, Gwenlyn Fudge RN, RN, [facility], Technician Referring MD:             Barrie Folk. Hill MD, MD Medicines:                Propofol per Anesthesia Complications:            No immediate complications. Estimated Blood Loss:     Estimated blood loss: none. Procedure:                Pre-Anesthesia Assessment:                           - Prior to the procedure, a History and Physical                            was performed, and patient medications and                            allergies were reviewed. The patient's tolerance of                            previous anesthesia was also reviewed. The risks                            and benefits of the procedure and the sedation                            options and risks were discussed with the patient.                            All questions were answered, and informed consent                            was obtained. Prior Anticoagulants: The patient has                            taken no previous anticoagulant or antiplatelet                            agents. ASA Grade Assessment: III - A patient with  severe systemic disease. After reviewing the risks                            and benefits, the patient was deemed in                            satisfactory condition to undergo the procedure.                           After obtaining informed consent, the colonoscope                            was passed under direct vision. Throughout the                            procedure, the  patient's blood pressure, pulse, and                            oxygen saturations were monitored continuously. The                            [id number] ([id number]) scope was introduced through                            the anus and advanced to the the ileocolonic                            anastomosis. The colonoscopy was performed without                            difficulty. The patient tolerated the procedure                            well. The quality of the bowel preparation was                            adequate. The terminal ileum and the rectum were                            photographed. Scope In: 11:18:43 AM Scope Out: 11:38:24 AM Scope Withdrawal Time: 0 hours 5 minutes 59 seconds  Total Procedure Duration: 0 hours 19 minutes 41 seconds  Findings:      The perianal exam findings include {skip}edema noted to perianalskin.      The neo-terminal ileum appeared normal.      There was evidence of a prior end-to-end ileo-colonic anastomosis at the       hepatic flexure. This was patent and was characterized by edema, friable       mucosa and mild stenosis. The anastomosis was traversed.      The exam was otherwise normal throughout the examined colon.      A patchy area of mildly friable mucosa was found in the distal rectum. Impression:               - Edema noted to perianal skin. found on perianal  exam.                           - The examined portion of the ileum was normal.                           - Patent end-to-end ileo-colonic anastomosis,                            characterized by edema, friable mucosa and mild                            stenosis.                           - Friability in the distal rectum/anal canal with                            erosions, erythema and edema.                           - No specimens collected.                           Comment: noncritical ileocolonic anastomotic                            stricture.                            Mild active diseasegion of anal rectal region. Moderate Sedation:      Per Anesthesia Care Recommendation:           - Patient has a contact number available for                            emergencies. The signs and symptoms of potential                            delayed complications were discussed with the                            patient. Return to normal activities tomorrow.                            Written discharge instructions were provided to the                            patient.                           - Resume previous diet today.                           - Continue present medications.                           - Repeat colonoscopy in 5 years  for surveillance. Procedure Code(s):        --- Professional ---                           [id number], Colonoscopy, flexible; diagnostic, including                            collection of specimen(s) by brushing or washing,                            when performed (separate procedure) Diagnosis Code(s):        --- Professional ---                           K50.80, Crohn's disease of both small and large                            intestine without complications                           Z98.0, Intestinal bypass and anastomosis status                           K62.89, Other specified diseases of anus and rectum CPT copyright 2016 [employer]. All rights reserved. The codes documented in this report are preliminary and upon coder review may  be revised to meet current compliance requirements. Hildred Laser, MD Hildred Laser, MD 03/12/2017 11:57:50 AM This report has been signed electronically. Number of Addenda: 0

## 2017-03-16 ENCOUNTER — Encounter (HOSPITAL_COMMUNITY): Payer: Self-pay | Admitting: Internal Medicine

## 2017-03-18 ENCOUNTER — Other Ambulatory Visit (INDEPENDENT_AMBULATORY_CARE_PROVIDER_SITE_OTHER): Payer: Self-pay | Admitting: Internal Medicine

## 2017-03-18 ENCOUNTER — Encounter (HOSPITAL_COMMUNITY)
Admission: RE | Admit: 2017-03-18 | Discharge: 2017-03-18 | Disposition: A | Discharge status: Home / Self Care | Payer: Medicare HMO | Source: Ambulatory Visit | Attending: Internal Medicine | Admitting: Internal Medicine

## 2017-03-18 DIAGNOSIS — K5090 Crohn's disease, unspecified, without complications: Secondary | ICD-10-CM | POA: Diagnosis not present

## 2017-03-18 MED ORDER — LORATADINE 10 MG PO TABS
ORAL_TABLET | ORAL | Status: AC
  Filled 2017-03-18: qty 1

## 2017-03-18 MED ORDER — ACETAMINOPHEN 325 MG PO TABS
ORAL_TABLET | ORAL | Status: AC
  Filled 2017-03-18: qty 2

## 2017-03-18 MED ORDER — ACETAMINOPHEN 325 MG PO TABS
650.0000 mg | ORAL_TABLET | Freq: Once | ORAL | Status: AC
  Administered 2017-03-18: 650 mg via ORAL

## 2017-03-18 MED ORDER — SODIUM CHLORIDE 0.9 % IV SOLN
INTRAVENOUS | Status: DC
  Administered 2017-03-18: 250 mL via INTRAVENOUS

## 2017-03-18 MED ORDER — LORATADINE 10 MG PO TABS
10.0000 mg | ORAL_TABLET | Freq: Once | ORAL | Status: AC
  Administered 2017-03-18: 10 mg via ORAL

## 2017-03-18 MED ORDER — SODIUM CHLORIDE 0.9 % IV SOLN
5.0000 mg/kg | INTRAVENOUS | Status: DC
  Administered 2017-03-18: 300 mg via INTRAVENOUS
  Filled 2017-03-18: qty 30

## 2017-03-23 ENCOUNTER — Other Ambulatory Visit (INDEPENDENT_AMBULATORY_CARE_PROVIDER_SITE_OTHER): Payer: Self-pay | Admitting: Internal Medicine

## 2017-03-23 DIAGNOSIS — R197 Diarrhea, unspecified: Secondary | ICD-10-CM

## 2017-03-23 DIAGNOSIS — R21 Rash and other nonspecific skin eruption: Secondary | ICD-10-CM

## 2017-03-29 ENCOUNTER — Other Ambulatory Visit (INDEPENDENT_AMBULATORY_CARE_PROVIDER_SITE_OTHER): Payer: Self-pay | Admitting: Internal Medicine

## 2017-03-29 DIAGNOSIS — R197 Diarrhea, unspecified: Secondary | ICD-10-CM

## 2017-03-29 DIAGNOSIS — R21 Rash and other nonspecific skin eruption: Secondary | ICD-10-CM

## 2017-03-31 ENCOUNTER — Other Ambulatory Visit (INDEPENDENT_AMBULATORY_CARE_PROVIDER_SITE_OTHER): Payer: Self-pay | Admitting: Internal Medicine

## 2017-04-08 ENCOUNTER — Other Ambulatory Visit (INDEPENDENT_AMBULATORY_CARE_PROVIDER_SITE_OTHER): Payer: Self-pay | Admitting: Internal Medicine

## 2017-04-08 DIAGNOSIS — R634 Abnormal weight loss: Secondary | ICD-10-CM

## 2017-04-09 ENCOUNTER — Other Ambulatory Visit (INDEPENDENT_AMBULATORY_CARE_PROVIDER_SITE_OTHER): Payer: Self-pay | Admitting: *Deleted

## 2017-04-09 DIAGNOSIS — R634 Abnormal weight loss: Secondary | ICD-10-CM

## 2017-04-09 MED ORDER — MEGESTROL ACETATE 40 MG/ML PO SUSP: 200.0000 mg | Freq: Every day | ORAL | 2 refills | Status: DC

## 2017-04-09 NOTE — Telephone Encounter (Signed)
This has been resent to the patient 's pharmacy.

## 2017-04-14 ENCOUNTER — Other Ambulatory Visit (INDEPENDENT_AMBULATORY_CARE_PROVIDER_SITE_OTHER): Payer: Self-pay | Admitting: *Deleted

## 2017-04-14 MED ORDER — POTASSIUM CHLORIDE CRYS ER 20 MEQ PO TBCR: 20.0000 meq | EXTENDED_RELEASE_TABLET | Freq: Every day | ORAL | 1 refills | Status: DC

## 2017-04-15 ENCOUNTER — Encounter (INDEPENDENT_AMBULATORY_CARE_PROVIDER_SITE_OTHER): Payer: Self-pay | Admitting: Internal Medicine

## 2017-04-15 ENCOUNTER — Ambulatory Visit (INDEPENDENT_AMBULATORY_CARE_PROVIDER_SITE_OTHER): Payer: Medicare HMO | Admitting: Internal Medicine

## 2017-04-15 VITALS — BP 108/70 | HR 72 | Temp 97.0°F | Resp 18 | Ht 64.5 in | Wt 133.8 lb

## 2017-04-15 DIAGNOSIS — K219 Gastro-esophageal reflux disease without esophagitis: Secondary | ICD-10-CM | POA: Diagnosis not present

## 2017-04-15 DIAGNOSIS — D508 Other iron deficiency anemias: Secondary | ICD-10-CM

## 2017-04-15 DIAGNOSIS — R630 Anorexia: Secondary | ICD-10-CM

## 2017-04-15 DIAGNOSIS — R634 Abnormal weight loss: Secondary | ICD-10-CM | POA: Diagnosis not present

## 2017-04-15 DIAGNOSIS — K50118 Crohn's disease of large intestine with other complication: Secondary | ICD-10-CM

## 2017-04-15 DIAGNOSIS — Z8639 Personal history of other endocrine, nutritional and metabolic disease: Secondary | ICD-10-CM

## 2017-04-15 NOTE — Patient Instructions (Signed)
Physician will call with results of blood tests 

## 2017-04-15 NOTE — Progress Notes (Signed)
Presenting complaint;  Follow-up for Crohn's disease GERD and weight loss.  Database and Subjective:  Patient is 71 year old African-American female with multiple GI problems was in for scheduled visit. She is on infliximab for maintenance of Crohn's disease. She also has chronic GERD with large hiatal hernia as well as iron deficiency anemia and history of hypokalemia. She underwent EGD with dilation on 03/12/2017. She had Schatzki's ring and a large hiatal hernia. She also underwent colonoscopy which revealed mild disease at ileocolonic anastomosis and some edema induration to perianal skin.  She complains of poor appetite and continued weight loss. She has lost 18 pounds since her last visit of 02/18/2017. She has no appetite whatsoever. She denies nausea vomiting and she is not having dysphagia anymore. She feels heartburn is well controlled with therapy. She generally has 4 bowel movements per day. Most of her stools are loose and small volume. She denies melena or rectal bleeding or abdominal pain. She also complains feeling tired and late afternoon even though she has not done much. She has not had any postural symptoms. She also denies excessive flatus. She uses dicyclomine and Lomotil on as-needed basis. Review of the systems is negative for fever chills or night sweats.   Current Medications: Outpatient Encounter Prescriptions as of 04/15/2017  Medication Sig  . acetaminophen (TYLENOL) 500 MG tablet Take 1,000 mg by mouth every 6 (six) hours as needed for mild pain or moderate pain.  [name] albuterol (PROVENTIL HFA;VENTOLIN HFA) 108 (90 Base) MCG/ACT inhaler Inhale 2 puffs into the lungs every 6 (six) hours as needed for wheezing.  . Calcium Carbonate-Vitamin D (CALCIUM + D PO) Take 1 tablet by mouth daily.  . Cyanocobalamin (VITAMIN B-12 IJ) Inject 1 each as directed every 30 (thirty) days. Around the 1st week of each month  . dicyclomine (BENTYL) 10 MG/5ML syrup TAKE 10 MLS (20 MG TOTAL)  BY MOUTH 3 (THREE) TIMES DAILY AS NEEDED.  [name] diphenoxylate-atropine (LOMOTIL) 2.5-0.025 MG tablet TAKE 1 TABLET BY MOUTH 4 TIMES A DAY AS NEEDED FOR DIARRHEA/LOOSE STOOL  . furosemide (LASIX) 40 MG tablet Take 1 tablet (40 mg total) by mouth daily as needed for edema.  [name] HYDROcodone-acetaminophen (NORCO) 7.5-325 MG tablet Take 1 tablet by mouth every 4 (four) hours as needed for moderate pain.  [name] inFLIXimab (REMICADE) 100 MG injection Inject 100 mg into the vein every 6 (six) weeks. Next is due 03-11-2017  . megestrol (MEGACE) 40 MG/ML suspension Take 5 mLs (200 mg total) by mouth daily.  . Multiple Vitamin (MULTIVITAMIN WITH MINERALS) TABS Take 1 tablet by mouth daily.  [name] nystatin (NYSTATIN) powder USE TO AFFECTED AREA TWICE DAILY (Patient taking differently: Apply 1 g topically 2 (two) times daily as needed. USE TO AFFECTED FOR ITCHING)  . nystatin cream (MYCOSTATIN) Apply 1 application topically 2 (two) times daily as needed for dry skin.  . pantoprazole (PROTONIX) 40 MG tablet TAKE 1 TABLET (40 MG TOTAL) BY MOUTH 2 (TWO) TIMES DAILY BEFORE A MEAL.  [name] potassium chloride SA (K-DUR,KLOR-CON) 20 MEQ tablet Take 1 tablet (20 mEq total) by mouth daily. Take potassium on days you take furosemide.  . promethazine (PHENERGAN) 25 MG tablet Take 1 tablet (25 mg total) by mouth 2 (two) times daily as needed. (Patient taking differently: Take 25 mg by mouth 2 (two) times daily as needed for nausea or vomiting. )  . spironolactone (ALDACTONE) 50 MG tablet Take 1 tablet (50 mg total) by mouth daily.  [name] triamcinolone cream (KENALOG) 0.1 %  APPLY TO AFFECTED AREA TWICE A DAY AS NEEDED  . Vitamin D, Ergocalciferol, (DRISDOL) 50000 units CAPS capsule Take 1 capsule (50,000 Units total) by mouth every 7 (seven) days. (Patient taking differently: Take 50,000 Units by mouth every 7 (seven) days. On Mondays)  . [DISCONTINUED] ondansetron (ZOFRAN) 4 MG tablet Take 1 tablet (4 mg total) by mouth as needed for nausea or  vomiting. Must 30 days before next refill (Patient not taking: Reported on 04/15/2017)  . [DISCONTINUED] promethazine (PHENERGAN) 25 MG tablet TAKE 1 TABLET (25 MG TOTAL) BY MOUTH 2 (TWO) TIMES DAILY AS NEEDED FOR NAUSEA OR VOMITING. (Patient not taking: Reported on 04/15/2017)   No facility-administered encounter medications on file as of 04/15/2017.      Objective: Blood pressure 108/70, pulse 72, temperature 97 F (36.1 C), temperature source Oral, resp. rate 18, height 5' 4.5" (1.638 m), weight 133 lb 12.8 oz (60.7 kg). Patient is alert and in no acute distress. Conjunctiva is pink. Sclera is nonicteric Oropharyngeal mucosa is normal. No neck masses or thyromegaly noted. Cardiac exam with regular rhythm normal S1 and S2. No murmur or gallop noted. Lungs are clear to auscultation. Abdomen is symmetrical with well-healed scars. Bowel sounds are normal. On palpation abdomen is soft and nontender without organomegaly or masses. 1+ pitting edema noted around ankles with increased pigmentation to distal one third of leg without skin ulceration  Labs/studies Results: Data from 03/01/2017  WBC 16.4, H&H 10.3 and 33.4 and MCV 81.8. Platelet count 395K.   Lab data from 03/12/2017  Serum sodium 141, potassium 4.1,  H&H 11.9 and 35.0.    Assessment:  #1.Anorexia and weight loss. Patient has lost 18 pounds in less than 2 months. The symptoms are not due to Crohn's disease. Since she has been on prednisone intermittently for several years I'm concerned that she may have Addison's disease. In the past her cortisol levels have been normal but she did respond to Megace. I do not believe she has malabsorption to account for these symptoms. One also has to [facility] but occult malignancy given her symptoms. Last abdominopelvic CT was less than 3 months ago. She may need chest CT unless source of her symptoms discovered. Will treat her with Megace if it is approved by her insurance while trying to figure  out source of her symptoms.  #2. Crohn's disease. She had colonoscopy about a month ago revealing mild disease at ileocolonic anastomosis. She is on infliximab. She has mild diarrhea most likely secondary to bowel resection and loss of ileocecal valve.  #3. GERD. Heartburn is well controlled with PPI. Recent EGD revealed Schatzki's ring which was dilated/disrupted and large hiatal hernia. Dysphagia has resolved since esophageal dilation.  #4. History of iron deficiency anemia.  #5. History of hypokalemia.   Plan:  Patient will go to the lab for sedimentation rate C-reactive protein metabolic 7 and CBC. Will also check fasting cortisol level. Will try to get Megace authorized from her insurance company. Office visit in 3 months.

## 2017-04-19 DIAGNOSIS — R32 Unspecified urinary incontinence: Secondary | ICD-10-CM | POA: Diagnosis not present

## 2017-04-19 DIAGNOSIS — K580 Irritable bowel syndrome with diarrhea: Secondary | ICD-10-CM | POA: Diagnosis not present

## 2017-04-19 DIAGNOSIS — K50919 Crohn's disease, unspecified, with unspecified complications: Secondary | ICD-10-CM | POA: Diagnosis not present

## 2017-04-21 ENCOUNTER — Observation Stay (HOSPITAL_COMMUNITY): Payer: Medicare HMO | Admitting: Anesthesiology

## 2017-04-21 ENCOUNTER — Encounter (HOSPITAL_COMMUNITY): Payer: Self-pay | Admitting: Anesthesiology

## 2017-04-21 ENCOUNTER — Inpatient Hospital Stay (HOSPITAL_COMMUNITY)
Admission: AD | Admit: 2017-04-21 | Discharge: 2017-04-29 | DRG: 345 | Disposition: A | Discharge status: Home / Self Care | Payer: Medicare HMO | Source: Ambulatory Visit | Attending: Family Medicine | Admitting: Family Medicine

## 2017-04-21 ENCOUNTER — Encounter (HOSPITAL_COMMUNITY): Admission: AD | Disposition: A | Discharge status: Home / Self Care | Payer: Self-pay | Source: Ambulatory Visit

## 2017-04-21 ENCOUNTER — Other Ambulatory Visit: Payer: Self-pay

## 2017-04-21 ENCOUNTER — Other Ambulatory Visit: Payer: Self-pay | Admitting: Surgery

## 2017-04-21 DIAGNOSIS — L409 Psoriasis, unspecified: Secondary | ICD-10-CM | POA: Diagnosis present

## 2017-04-21 DIAGNOSIS — K50114 Crohn's disease of large intestine with abscess: Secondary | ICD-10-CM | POA: Diagnosis not present

## 2017-04-21 DIAGNOSIS — I69392 Facial weakness following cerebral infarction: Secondary | ICD-10-CM

## 2017-04-21 DIAGNOSIS — K611 Rectal abscess: Secondary | ICD-10-CM

## 2017-04-21 DIAGNOSIS — R748 Abnormal levels of other serum enzymes: Secondary | ICD-10-CM | POA: Diagnosis not present

## 2017-04-21 DIAGNOSIS — K222 Esophageal obstruction: Secondary | ICD-10-CM | POA: Diagnosis present

## 2017-04-21 DIAGNOSIS — N823 Fistula of vagina to large intestine: Secondary | ICD-10-CM | POA: Diagnosis present

## 2017-04-21 DIAGNOSIS — D899 Disorder involving the immune mechanism, unspecified: Secondary | ICD-10-CM

## 2017-04-21 DIAGNOSIS — R634 Abnormal weight loss: Secondary | ICD-10-CM

## 2017-04-21 DIAGNOSIS — Z881 Allergy status to other antibiotic agents status: Secondary | ICD-10-CM

## 2017-04-21 DIAGNOSIS — E876 Hypokalemia: Secondary | ICD-10-CM | POA: Diagnosis present

## 2017-04-21 DIAGNOSIS — N179 Acute kidney failure, unspecified: Secondary | ICD-10-CM | POA: Diagnosis present

## 2017-04-21 DIAGNOSIS — Z79899 Other long term (current) drug therapy: Secondary | ICD-10-CM

## 2017-04-21 DIAGNOSIS — B373 Candidiasis of vulva and vagina: Secondary | ICD-10-CM | POA: Diagnosis present

## 2017-04-21 DIAGNOSIS — D638 Anemia in other chronic diseases classified elsewhere: Secondary | ICD-10-CM | POA: Diagnosis present

## 2017-04-21 DIAGNOSIS — K219 Gastro-esophageal reflux disease without esophagitis: Secondary | ICD-10-CM | POA: Diagnosis not present

## 2017-04-21 DIAGNOSIS — Z9689 Presence of other specified functional implants: Secondary | ICD-10-CM

## 2017-04-21 DIAGNOSIS — K1379 Other lesions of oral mucosa: Secondary | ICD-10-CM | POA: Diagnosis present

## 2017-04-21 DIAGNOSIS — R220 Localized swelling, mass and lump, head: Secondary | ICD-10-CM

## 2017-04-21 DIAGNOSIS — K50919 Crohn's disease, unspecified, with unspecified complications: Secondary | ICD-10-CM | POA: Diagnosis not present

## 2017-04-21 DIAGNOSIS — D849 Immunodeficiency, unspecified: Secondary | ICD-10-CM | POA: Diagnosis present

## 2017-04-21 DIAGNOSIS — D72829 Elevated white blood cell count, unspecified: Secondary | ICD-10-CM | POA: Diagnosis present

## 2017-04-21 DIAGNOSIS — K449 Diaphragmatic hernia without obstruction or gangrene: Secondary | ICD-10-CM | POA: Diagnosis present

## 2017-04-21 DIAGNOSIS — G459 Transient cerebral ischemic attack, unspecified: Secondary | ICD-10-CM | POA: Diagnosis not present

## 2017-04-21 DIAGNOSIS — R0789 Other chest pain: Secondary | ICD-10-CM | POA: Diagnosis not present

## 2017-04-21 DIAGNOSIS — K6289 Other specified diseases of anus and rectum: Secondary | ICD-10-CM | POA: Diagnosis present

## 2017-04-21 DIAGNOSIS — Z87891 Personal history of nicotine dependence: Secondary | ICD-10-CM | POA: Diagnosis not present

## 2017-04-21 DIAGNOSIS — K604 Rectal fistula: Secondary | ICD-10-CM | POA: Diagnosis present

## 2017-04-21 DIAGNOSIS — R0602 Shortness of breath: Secondary | ICD-10-CM

## 2017-04-21 DIAGNOSIS — Z882 Allergy status to sulfonamides status: Secondary | ICD-10-CM

## 2017-04-21 DIAGNOSIS — R202 Paresthesia of skin: Secondary | ICD-10-CM | POA: Diagnosis not present

## 2017-04-21 DIAGNOSIS — K50113 Crohn's disease of large intestine with fistula: Secondary | ICD-10-CM | POA: Diagnosis present

## 2017-04-21 DIAGNOSIS — G467 Other lacunar syndromes: Secondary | ICD-10-CM | POA: Diagnosis not present

## 2017-04-21 DIAGNOSIS — Z888 Allergy status to other drugs, medicaments and biological substances status: Secondary | ICD-10-CM | POA: Diagnosis not present

## 2017-04-21 DIAGNOSIS — N183 Chronic kidney disease, stage 3 unspecified: Secondary | ICD-10-CM | POA: Diagnosis present

## 2017-04-21 DIAGNOSIS — R200 Anesthesia of skin: Secondary | ICD-10-CM | POA: Diagnosis not present

## 2017-04-21 DIAGNOSIS — N828 Other female genital tract fistulae: Secondary | ICD-10-CM | POA: Diagnosis present

## 2017-04-21 DIAGNOSIS — L0231 Cutaneous abscess of buttock: Secondary | ICD-10-CM | POA: Diagnosis present

## 2017-04-21 DIAGNOSIS — R198 Other specified symptoms and signs involving the digestive system and abdomen: Secondary | ICD-10-CM | POA: Diagnosis not present

## 2017-04-21 DIAGNOSIS — R000 Tachycardia, unspecified: Secondary | ICD-10-CM | POA: Diagnosis present

## 2017-04-21 DIAGNOSIS — Z885 Allergy status to narcotic agent status: Secondary | ICD-10-CM | POA: Diagnosis not present

## 2017-04-21 DIAGNOSIS — R079 Chest pain, unspecified: Secondary | ICD-10-CM | POA: Diagnosis not present

## 2017-04-21 DIAGNOSIS — R0781 Pleurodynia: Secondary | ICD-10-CM | POA: Diagnosis not present

## 2017-04-21 DIAGNOSIS — N898 Other specified noninflammatory disorders of vagina: Secondary | ICD-10-CM | POA: Diagnosis not present

## 2017-04-21 DIAGNOSIS — L989 Disorder of the skin and subcutaneous tissue, unspecified: Secondary | ICD-10-CM

## 2017-04-21 DIAGNOSIS — K5090 Crohn's disease, unspecified, without complications: Secondary | ICD-10-CM | POA: Diagnosis present

## 2017-04-21 DIAGNOSIS — K50913 Crohn's disease, unspecified, with fistula: Secondary | ICD-10-CM | POA: Diagnosis present

## 2017-04-21 DIAGNOSIS — K50013 Crohn's disease of small intestine with fistula: Secondary | ICD-10-CM | POA: Diagnosis not present

## 2017-04-21 DIAGNOSIS — I360 Nonrheumatic tricuspid (valve) stenosis: Secondary | ICD-10-CM | POA: Diagnosis not present

## 2017-04-21 HISTORY — DX: Cutaneous abscess of buttock: L02.31

## 2017-04-21 HISTORY — PX: INCISION AND DRAINAGE PERIRECTAL ABSCESS: SHX1804

## 2017-04-21 LAB — CBC
HEMATOCRIT: 36.1 % (ref 36.0–46.0)
Hemoglobin: 12.4 g/dL (ref 12.0–15.0)
MCH: 26.5 pg (ref 26.0–34.0)
MCHC: 34.3 g/dL (ref 30.0–36.0)
MCV: 77.1 fL — AB (ref 78.0–100.0)
PLATELETS: 336 10*3/uL (ref 150–400)
RBC: 4.68 MIL/uL (ref 3.87–5.11)
RDW: 17.5 % — AB (ref 11.5–15.5)
WBC: 44.9 10*3/uL — AB (ref 4.0–10.5)

## 2017-04-21 LAB — BASIC METABOLIC PANEL
Anion gap: 12 (ref 5–15)
BUN: 21 mg/dL — ABNORMAL HIGH (ref 6–20)
CALCIUM: 7.5 mg/dL — AB (ref 8.9–10.3)
CO2: 14 mmol/L — AB (ref 22–32)
CREATININE: 1.71 mg/dL — AB (ref 0.44–1.00)
Chloride: 113 mmol/L — ABNORMAL HIGH (ref 101–111)
GFR calc Af Amer: 34 mL/min — ABNORMAL LOW (ref >60)
GFR, EST NON AFRICAN AMERICAN: 29 mL/min — AB (ref >60)
GLUCOSE: 76 mg/dL (ref 65–99)
Potassium: <2 mmol/L — CL (ref 3.5–5.1)
Sodium: 139 mmol/L (ref 135–145)

## 2017-04-21 SURGERY — INCISION AND DRAINAGE, ABSCESS, PERIRECTAL
Anesthesia: General | Site: Buttocks

## 2017-04-21 MED ORDER — BUPIVACAINE LIPOSOME 1.3 % IJ SUSP
20.0000 mL | INTRAMUSCULAR | Status: DC
  Filled 2017-04-21: qty 20

## 2017-04-21 MED ORDER — PROPOFOL 10 MG/ML IV BOLUS
INTRAVENOUS | Status: AC
  Filled 2017-04-21: qty 20

## 2017-04-21 MED ORDER — HYDROMORPHONE HCL 1 MG/ML IJ SOLN
0.5000 mg | INTRAMUSCULAR | Status: DC | PRN
  Administered 2017-04-21 – 2017-04-22 (×4): 0.5 mg via INTRAVENOUS
  Filled 2017-04-21 (×4): qty 1

## 2017-04-21 MED ORDER — PROPOFOL 10 MG/ML IV BOLUS
INTRAVENOUS | Status: DC | PRN
  Administered 2017-04-21 (×2): 50 mg via INTRAVENOUS
  Administered 2017-04-21: 100 mg via INTRAVENOUS

## 2017-04-21 MED ORDER — FENTANYL CITRATE (PF) 250 MCG/5ML IJ SOLN
INTRAMUSCULAR | Status: AC
  Filled 2017-04-21: qty 5

## 2017-04-21 MED ORDER — POLYETHYLENE GLYCOL 3350 17 G PO PACK: 17.0000 g | PACK | Freq: Every day | ORAL | Status: DC | PRN

## 2017-04-21 MED ORDER — ONDANSETRON HCL 4 MG/2ML IJ SOLN
INTRAMUSCULAR | Status: DC | PRN
  Administered 2017-04-21: 4 mg via INTRAVENOUS

## 2017-04-21 MED ORDER — ACETAMINOPHEN 325 MG PO TABS: 650.0000 mg | ORAL_TABLET | Freq: Four times a day (QID) | ORAL | Status: DC | PRN

## 2017-04-21 MED ORDER — FENTANYL CITRATE (PF) 100 MCG/2ML IJ SOLN
INTRAMUSCULAR | Status: DC | PRN
  Administered 2017-04-21 (×3): 25 ug via INTRAVENOUS
  Administered 2017-04-21: 50 ug via INTRAVENOUS

## 2017-04-21 MED ORDER — SIMETHICONE 80 MG PO CHEW
40.0000 mg | CHEWABLE_TABLET | Freq: Four times a day (QID) | ORAL | Status: DC | PRN
  Filled 2017-04-21: qty 1

## 2017-04-21 MED ORDER — DEXTROSE 5 % IV SOLN
2.0000 g | Freq: Every day | INTRAVENOUS | Status: DC
  Administered 2017-04-21 – 2017-04-27 (×7): 2 g via INTRAVENOUS
  Filled 2017-04-21 (×9): qty 2

## 2017-04-21 MED ORDER — POTASSIUM CHLORIDE 10 MEQ/100ML IV SOLN
INTRAVENOUS | Status: AC
  Filled 2017-04-21: qty 200

## 2017-04-21 MED ORDER — ONDANSETRON HCL 4 MG/2ML IJ SOLN: 4.0000 mg | Freq: Once | INTRAMUSCULAR | Status: DC | PRN

## 2017-04-21 MED ORDER — 0.9 % SODIUM CHLORIDE (POUR BTL) OPTIME
TOPICAL | Status: DC | PRN
  Administered 2017-04-21: 1000 mL

## 2017-04-21 MED ORDER — KCL IN DEXTROSE-NACL 20-5-0.45 MEQ/L-%-% IV SOLN
INTRAVENOUS | Status: DC
  Administered 2017-04-21 – 2017-04-27 (×7): via INTRAVENOUS
  Filled 2017-04-21 (×8): qty 1000

## 2017-04-21 MED ORDER — BUPIVACAINE LIPOSOME 1.3 % IJ SUSP
INTRAMUSCULAR | Status: DC | PRN
  Administered 2017-04-21: 20 mL

## 2017-04-21 MED ORDER — ONDANSETRON 4 MG PO TBDP: 4.0000 mg | ORAL_TABLET | Freq: Four times a day (QID) | ORAL | Status: DC | PRN

## 2017-04-21 MED ORDER — POTASSIUM CHLORIDE 10 MEQ/100ML IV SOLN
10.0000 meq | INTRAVENOUS | Status: AC
  Administered 2017-04-21 (×4): 10 meq via INTRAVENOUS
  Filled 2017-04-21 (×3): qty 100

## 2017-04-21 MED ORDER — SPIRONOLACTONE 25 MG PO TABS
50.0000 mg | ORAL_TABLET | Freq: Every day | ORAL | Status: DC
  Administered 2017-04-21: 50 mg via ORAL
  Filled 2017-04-21 (×2): qty 2

## 2017-04-21 MED ORDER — ONDANSETRON HCL 4 MG/2ML IJ SOLN
4.0000 mg | Freq: Four times a day (QID) | INTRAMUSCULAR | Status: DC | PRN
Start: 2017-04-21 — End: 2017-04-29
  Administered 2017-04-26 – 2017-04-27 (×2): 4 mg via INTRAVENOUS
  Filled 2017-04-21 (×2): qty 2

## 2017-04-21 MED ORDER — METRONIDAZOLE IN NACL 5-0.79 MG/ML-% IV SOLN
500.0000 mg | Freq: Four times a day (QID) | INTRAVENOUS | Status: DC
  Administered 2017-04-21 – 2017-04-26 (×20): 500 mg via INTRAVENOUS
  Filled 2017-04-21 (×21): qty 100

## 2017-04-21 MED ORDER — LIDOCAINE HCL (CARDIAC) 20 MG/ML IV SOLN
INTRAVENOUS | Status: DC | PRN
  Administered 2017-04-21: 60 mg via INTRAVENOUS

## 2017-04-21 MED ORDER — LACTATED RINGERS IV SOLN
INTRAVENOUS | Status: DC
  Administered 2017-04-21 (×2): via INTRAVENOUS

## 2017-04-21 MED ORDER — FENTANYL CITRATE (PF) 100 MCG/2ML IJ SOLN: 25.0000 ug | INTRAMUSCULAR | Status: DC | PRN

## 2017-04-21 MED ORDER — ACETAMINOPHEN 650 MG RE SUPP: 650.0000 mg | Freq: Four times a day (QID) | RECTAL | Status: DC | PRN

## 2017-04-21 SURGICAL SUPPLY — 31 items
BNDG CONFORM 2 STRL LF (GAUZE/BANDAGES/DRESSINGS) ×4 IMPLANT
CANISTER SUCT 3000ML PPV (MISCELLANEOUS) ×3 IMPLANT
COVER SURGICAL LIGHT HANDLE (MISCELLANEOUS) ×3 IMPLANT
DRAPE UTILITY XL STRL (DRAPES) ×6 IMPLANT
DRSG PAD ABDOMINAL 8X10 ST (GAUZE/BANDAGES/DRESSINGS) ×3 IMPLANT
ELECT CAUTERY BLADE 6.4 (BLADE) ×3 IMPLANT
ELECT REM PT RETURN 9FT ADLT (ELECTROSURGICAL) ×3
ELECTRODE REM PT RTRN 9FT ADLT (ELECTROSURGICAL) ×1 IMPLANT
GAUZE PACKING IODOFORM 1 (PACKING) IMPLANT
GAUZE SPONGE 4X4 12PLY STRL (GAUZE/BANDAGES/DRESSINGS) ×3 IMPLANT
GLOVE SURG SIGNA 7.5 PF LTX (GLOVE) ×3 IMPLANT
GOWN STRL REUS W/ TWL LRG LVL3 (GOWN DISPOSABLE) ×1 IMPLANT
GOWN STRL REUS W/ TWL XL LVL3 (GOWN DISPOSABLE) ×1 IMPLANT
GOWN STRL REUS W/TWL LRG LVL3 (GOWN DISPOSABLE) ×3
GOWN STRL REUS W/TWL XL LVL3 (GOWN DISPOSABLE) ×3
KIT BASIN OR (CUSTOM PROCEDURE TRAY) ×3 IMPLANT
KIT ROOM TURNOVER OR (KITS) ×3 IMPLANT
NS IRRIG 1000ML POUR BTL (IV SOLUTION) ×3 IMPLANT
PACK LITHOTOMY IV (CUSTOM PROCEDURE TRAY) ×3 IMPLANT
PAD ARMBOARD 7.5X6 YLW CONV (MISCELLANEOUS) ×3 IMPLANT
PENCIL BUTTON HOLSTER BLD 10FT (ELECTRODE) ×3 IMPLANT
SPONGE LAP 18X18 X RAY DECT (DISPOSABLE) ×3 IMPLANT
SWAB COLLECTION DEVICE MRSA (MISCELLANEOUS) IMPLANT
SWAB CULTURE ESWAB REG 1ML (MISCELLANEOUS) IMPLANT
SYR BULB 3OZ (MISCELLANEOUS) ×3 IMPLANT
TOWEL OR 17X24 6PK STRL BLUE (TOWEL DISPOSABLE) ×3 IMPLANT
TOWEL OR 17X26 10 PK STRL BLUE (TOWEL DISPOSABLE) ×3 IMPLANT
TUBE CONNECTING 12'X1/4 (SUCTIONS) ×1
TUBE CONNECTING 12X1/4 (SUCTIONS) ×2 IMPLANT
UNDERPAD 30X30 (UNDERPADS AND DIAPERS) ×3 IMPLANT
YANKAUER SUCT BULB TIP NO VENT (SUCTIONS) ×3 IMPLANT

## 2017-04-21 NOTE — Op Note (Signed)
IRRIGATION AND DEBRIDEMENT PERIRECTAL ABSCESS  Procedure Note  Danielle [name] [birth year]   Pre-op Diagnosis: perirectal abscess     Post-op Diagnosis: same  Procedure(s): INCISION AND DRAINAGE PERIRECTAL ABSCESS  Surgeon(s): Coralie Keens, MD  Anesthesia: General  Staff:  Circulator: Beryle Lathe, RN; Rosanne Sack, RN Scrub Person: Nicholos Johns, RN  Estimated Blood Loss: Minimal               Indications: This is [name] patient with Crohn's disease who presents with [name] left sided perirectal abscess. 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  Findings: Patient had [name] large left-sided perirectal abscess. It did not appear to communicate intracranially. She did have some stool, her vagina and I still palpate [name] small fistula tract  Procedure: The patient was brought to the operating room and identified as the correct patient. She is placed upon the operating table and general anesthesia was induced. She was then placed into the lithotomy position. Her perianal area was prepped and draped in the usual sterile fashion. Prior to starting procedure she had stool come out of her vagina. I palpated this area and could feel [name] small fistula was still present from the anus to the vagina.  I anesthetized the area of palpable fluctuance which was on the left side [name] more posterior with Experal.  I then made an incision with [name] scalpel and entered [name] large abscess cavity with [name] large amount of purulence. All the purulence was drained. Hemostasis appeared to be achieved with the cautery. Again the wound with saline. I then packed it with [name] 2 inch Kling. Dry gauze placed on top. The patient tolerated procedure well. All the counts were correct at the end procedure. The patient was then expanded in the operating room and taken in stable condition to the recovery room.          Danielle [name]   Date: 04/21/2017  Time: 2:43 PM

## 2017-04-21 NOTE — Progress Notes (Signed)
Patient ID: Danielle [name], female   DOB: [birth year], 71 y.o.   MRN: [id number]   I have seen and examined the patient and agree with the assessment and plans. Perirectal abscess with hx of Crohn's disease Plan incision and drainage of the abscess in the OR today. I discussed the risks of bleeding and ongoing infection/need for further surgery with the patient.  She agrees to proceed.  Arrionna Serena A. Ninfa Linden  MD, FACS

## 2017-04-21 NOTE — Progress Notes (Signed)
CARISA BACKHAUS [id number] Admission Data: 04/21/2017 8:05 PM Attending Provider: Nolon Nations, MD  BWI:OMBT, Berneta Sages, MD Consults/ Treatment Team:   Danielle [name] is a 71 y.o. female patient direct admit awake, alert  & orientated  X 3,  Full Code, VSS - Blood pressure (!) 82/51, pulse 97, temperature 99.4 F (37.4 C), temperature source Oral, resp. rate 16, height 5\' 4"  (1.626 m), weight 58.4 kg (128 lb 11.2 oz), SpO2 94 %., O2   no c/o shortness of breath, no c/o chest pain, no distress noted.    Allergies:   Allergies  Allergen Reactions  . Zithromax [Azithromycin] Swelling    Patient states that she has a swollen mouth/ blisters, yeast present mouth and vaginia.  . Aspirin Other (See Comments)    Cramps her stomach  . Ibuprofen Other (See Comments)    Patient preference  . Sulfa Antibiotics Nausea And Vomiting     Past Medical History:  Diagnosis Date  . Crohn disease ([place])   . Hiatal hernia 12/07/2015  . Psoriasis     History:  obtained from chart review. Tobacco/alcohol: denied none  Pt orientation to unit, room and routine. Information packet given to patient/family and safety video watched.  Admission INP armband ID verified with patient/family, and in place. SR up x 2, fall risk assessment complete with Patient and family verbalizing understanding of risks associated with falls. Pt verbalizes an understanding of how to use the call bell and to call for help before getting out of bed.  Skin, clean-dry- intact without evidence of bruising, or skin tears.   No evidence of skin break down noted on exam. no rashes, no ecchymoses, no petechiae, no nodules, no jaundice, no purpura Direct admit with perirectal abscess. Came to floor via wheelchair. In house surgical came to see patient and wanted to do I & D now. Had patient sign consent but did not have time to get IV started and lab did not have time to get labs drawn. Pt left the floor within the hour for procedure.     Will  cont to monitor and assist as needed.  Salley Slaughter, RN 04/21/2017 8:05 PM

## 2017-04-21 NOTE — Anesthesia Procedure Notes (Signed)
Procedure Name: LMA Insertion Date/Time: 04/21/2017 2:10 PM Performed by: Neldon Newport Pre-anesthesia Checklist: Timeout performed, Patient being monitored, Suction available, Emergency Drugs available and Patient identified Patient Re-evaluated:Patient Re-evaluated prior to inductionOxygen Delivery Method: Circle system utilized Preoxygenation: Pre-oxygenation with 100% oxygen Intubation Type: IV induction Ventilation: Mask ventilation without difficulty LMA: LMA inserted LMA Size: 4.0 Number of attempts: 2 Placement Confirmation: breath sounds checked- equal and bilateral and positive ETCO2 Dental Injury: Teeth and Oropharynx as per pre-operative assessment

## 2017-04-21 NOTE — Progress Notes (Signed)
[facility] Admission Note  Danielle [name] [id number]  [id number].    HPI:  The patient is a 71 year old female who presented to our [place] office with new left perirectal pain. 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 She is on her Remicade therapy. Her seton was removed in October 2016. She has had episodes rectal pain that have resolved with abx.She was seen in early April 2017 with similar symptoms. We placed her on Cipro and Flagyl and this resolved relatively quickly. She was seen by Dr Marcello Moores recently and had the area on left buttock that becomes enlarged from time to time and painful. It then spontaneously reduces. She denies any weight loss or worsening of her Crohn's symptoms recently. this area over last few days has become increasingly tender with purulence coming from it. she is unable to really sit on this. she has no fevers present. Recent upper and lower endoscopies 03/2017 significant for Schatzki ring in esophagus, stomach and duodenum normal, there was evidence of a prior end-to-end ileo-colonic anastomosis at the hepatic flexure which was patent with mild edema, friable mucosa, and mild stenosis. The anastomosis was traversed. The exam was otherwise normal throughout the examined colon. She is a former smoker who denies use of blood thinning medications.   ROS: Review of Systems  Constitutional: Negative for chills and fever.  Skin:       left buttock pain  All other systems reviewed and are negative.   Family History  Problem Relation Age of Onset  . Diabetes Mother   . Heart disease Father   . Healthy Sister   . Hypertension Brother   . Colon cancer Brother        died with colon cancer  . Crohn's disease Paternal Aunt     Past Medical History:  Diagnosis Date  . Crohn disease ([place])   . Hiatal hernia 12/07/2015  . Psoriasis      Past Surgical History:  Procedure Laterality Date  . ANAL STENOSIS  ~ 2010  . APPENDECTOMY  1980's  . BOWEL RESECTION  2009  . CHOLECYSTECTOMY  1990's  . COLONOSCOPY  2013  . COLONOSCOPY WITH PROPOFOL N/A 03/12/2017   Procedure: COLONOSCOPY WITH PROPOFOL;  Surgeon: Rogene Houston, MD;  [facility];  Service: Endoscopy;  Laterality: N/A;  2:05-moved up to 1025 per Lelon Frohlich  . COLOSTOMY  2009   DEMASO  . COLOSTOMY TAKEDOWN  2009   "only had it for about 1 month" (08/22/2013)  . ESOPHAGEAL DILATION N/A 03/12/2017   Procedure: ESOPHAGEAL DILATION;  Surgeon: Rogene Houston, MD;  [facility];  Service: Endoscopy;  Laterality: N/A;  . ESOPHAGOGASTRODUODENOSCOPY  11/11/2011   Procedure: ESOPHAGOGASTRODUODENOSCOPY (EGD);  Surgeon: Missy Sabins, MD;  Location: [facility] ENDOSCOPY;  Service: Endoscopy;  Laterality: N/A;  . ESOPHAGOGASTRODUODENOSCOPY  05/18/2012   Procedure: ESOPHAGOGASTRODUODENOSCOPY (EGD);  Surgeon: Rogene Houston, MD;  [facility];  Service: Endoscopy;  Laterality: N/A;  200  . ESOPHAGOGASTRODUODENOSCOPY (EGD) WITH PROPOFOL N/A 03/12/2017   Procedure: ESOPHAGOGASTRODUODENOSCOPY (EGD) WITH PROPOFOL;  Surgeon: Rogene Houston, MD;  [facility];  Service: Endoscopy;  Laterality: N/A;  . FLEXIBLE SIGMOIDOSCOPY  11/11/2011   Procedure: FLEXIBLE SIGMOIDOSCOPY;  Surgeon: Missy Sabins, MD;  Location: [place];  Service: Endoscopy;  Laterality: N/A;  . goiter  1999  . LEFT HEART CATHETERIZATION WITH CORONARY ANGIOGRAM N/A 07/09/2014  Procedure: LEFT HEART CATHETERIZATION WITH CORONARY ANGIOGRAM;  Surgeon: Peter M Martinique, MD;  Location: [facility] CATH LAB;  Service: Cardiovascular;  Laterality: N/A;  . PROCTOSCOPY N/A 01/31/2015   Procedure: RIGID PROCTOSCOPY;  Surgeon: Leighton Ruff, MD;  Location: [place];  Service: General;  Laterality: N/A;  . RECTAL EXAM UNDER ANESTHESIA N/A 01/31/2015   Procedure: RECTAL EXAM UNDER ANESTHESIA WITH PLACEMENT OF A SETON;   Surgeon: Leighton Ruff, MD;  Location: [place];  Service: General;  Laterality: N/A;    Social History:  reports that she quit smoking about 20 years ago. Her smoking use included Cigarettes. She has a 0.48 pack-year smoking history. She has never used smokeless tobacco. She reports that she does not drink alcohol or use drugs.  Allergies:  Allergies  Allergen Reactions  . Zithromax [Azithromycin] Swelling    Patient states that she has a swollen mouth/ blisters, yeast present mouth and vaginia.  . Aspirin Other (See Comments)    Cramps her stomach  . Ibuprofen Other (See Comments)    Patient preference  . Sulfa Antibiotics Nausea And Vomiting    Medications Prior to Admission  Medication Sig Dispense Refill  . acetaminophen (TYLENOL) 500 MG tablet Take 1,000 mg by mouth every 6 (six) hours as needed for mild pain or moderate pain.    [name] albuterol (PROVENTIL HFA;VENTOLIN HFA) 108 (90 Base) MCG/ACT inhaler Inhale 2 puffs into the lungs every 6 (six) hours as needed for wheezing. 1 Inhaler 2  . Calcium Carbonate-Vitamin D (CALCIUM + D PO) Take 1 tablet by mouth daily.    . Cyanocobalamin (VITAMIN B-12 IJ) Inject 1 each as directed every 30 (thirty) days. Around the 1st week of each month    . dicyclomine (BENTYL) 10 MG/5ML syrup TAKE 10 MLS (20 MG TOTAL) BY MOUTH 3 (THREE) TIMES DAILY AS NEEDED. 300 mL 3  . diphenoxylate-atropine (LOMOTIL) 2.5-0.025 MG tablet TAKE 1 TABLET BY MOUTH 4 TIMES A DAY AS NEEDED FOR DIARRHEA/LOOSE STOOL 90 tablet 0  . furosemide (LASIX) 40 MG tablet Take 1 tablet (40 mg total) by mouth daily as needed for edema. 30 tablet 1  . HYDROcodone-acetaminophen (NORCO) 7.5-325 MG tablet Take 1 tablet by mouth every 4 (four) hours as needed for moderate pain. 6 tablet 0  . inFLIXimab (REMICADE) 100 MG injection Inject 100 mg into the vein every 6 (six) weeks. Next is due 03-11-2017    . megestrol (MEGACE) 40 MG/ML suspension Take 5 mLs (200 mg total) by mouth daily. 150 mL 2  .  Multiple Vitamin (MULTIVITAMIN WITH MINERALS) TABS Take 1 tablet by mouth daily.    [name] nystatin (NYSTATIN) powder USE TO AFFECTED AREA TWICE DAILY (Patient taking differently: Apply 1 g topically 2 (two) times daily as needed. USE TO AFFECTED FOR ITCHING) 60 g 2  . nystatin cream (MYCOSTATIN) Apply 1 application topically 2 (two) times daily as needed for dry skin. 30 g 1  . pantoprazole (PROTONIX) 40 MG tablet TAKE 1 TABLET (40 MG TOTAL) BY MOUTH 2 (TWO) TIMES DAILY BEFORE A MEAL. 180 tablet 3  . potassium chloride SA (K-DUR,KLOR-CON) 20 MEQ tablet Take 1 tablet (20 mEq total) by mouth daily. Take potassium on days you take furosemide. 30 tablet 1  . promethazine (PHENERGAN) 25 MG tablet Take 1 tablet (25 mg total) by mouth 2 (two) times daily as needed. (Patient taking differently: Take 25 mg by mouth 2 (two) times daily as needed for nausea or vomiting. ) 20 tablet 0  .  spironolactone (ALDACTONE) 50 MG tablet Take 1 tablet (50 mg total) by mouth daily. 60 tablet 5  . triamcinolone cream (KENALOG) 0.1 % APPLY TO AFFECTED AREA TWICE A DAY AS NEEDED 30 g 1  . Vitamin D, Ergocalciferol, (DRISDOL) 50000 units CAPS capsule Take 1 capsule (50,000 Units total) by mouth every 7 (seven) days. (Patient taking differently: Take 50,000 Units by mouth every 7 (seven) days. On Mondays) 24 capsule 0    Blood pressure (!) 81/43, pulse 98, temperature 98.8 F (37.1 C), temperature source Oral, resp. rate 20, height 5\' 4"  (1.626 m), weight 58.4 kg (128 lb 11.2 oz). Physical Exam: Physical Exam  Constitutional: She is oriented to person, place, and time. She appears well-developed and well-nourished. No distress.  HENT:  Head: Normocephalic and atraumatic.  Right Ear: External ear normal.  Left Ear: External ear normal.  Eyes: EOM are normal. Pupils are equal, round, and reactive to light. No scleral icterus.  Neck: Normal range of motion. Neck supple. No tracheal deviation present.  Cardiovascular: Normal rate,  regular rhythm and normal heart sounds.  Exam reveals no friction rub.   No murmur heard. Pulmonary/Chest: Effort normal and breath sounds normal. No stridor. No respiratory distress. She has no wheezes. She has no rales. She exhibits no tenderness.  Abdominal: Soft. Bowel sounds are normal. She exhibits no distension. There is no tenderness.  Genitourinary:  Genitourinary Comments: Left buttock w/ large area that is erythematous, tender and fluctuant. There is purulence draining from left buttock from medial fistula  Musculoskeletal: Normal range of motion. She exhibits no edema or deformity.  Neurological: She is oriented to person, place, and time. No sensory deficit.  Skin: Skin is warm and dry. No rash noted.  Psychiatric: Her behavior is normal.   No results found for this or any previous visit (from the past 48 hour(s)). No results found.  Assessment/Plan Perirectal abscess in the setting of crohn's disease  Perirectal fistula  - NPO, IVF - IV abx - OR for exam under general anesthesia and I&D of abscess - CBC and BMET    Jill Alexanders, [facility] 04/21/2017, 12:30 PM Pager: [phone] Consults: [phone] Mon-Fri 7:00 am-4:30 pm Sat-Sun 7:00 am-11:30 am

## 2017-04-21 NOTE — Anesthesia Postprocedure Evaluation (Signed)
Anesthesia Post Note  Patient: Danielle [name]  Procedure(s) Performed: Procedure(s) (LRB): IRRIGATION AND DEBRIDEMENT PERIRECTAL ABSCESS (N/A)  Patient location during evaluation: PACU Anesthesia Type: General Level of consciousness: awake and alert Pain management: pain level controlled Vital Signs Assessment: post-procedure vital signs reviewed and stable Respiratory status: spontaneous breathing, nonlabored ventilation, respiratory function stable and patient connected to nasal cannula oxygen Cardiovascular status: blood pressure returned to baseline and stable Postop Assessment: no signs of nausea or vomiting Anesthetic complications: no       Last Vitals:  Vitals:   04/21/17 1518 04/21/17 1519  BP: 101/60   Pulse:    Resp: (!) 28 17  Temp:  36.1 C    Last Pain:  Vitals:   04/21/17 1202  TempSrc: Oral                 Zhanae Proffit P Lavell Supple

## 2017-04-21 NOTE — Transfer of Care (Signed)
Immediate Anesthesia Transfer of Care Note  Patient: Danielle [name]  Procedure(s) Performed: Procedure(s): IRRIGATION AND DEBRIDEMENT PERIRECTAL ABSCESS (N/A)  Patient Location: PACU  Anesthesia Type:General  Level of Consciousness: awake, alert  and oriented  Airway & Oxygen Therapy: Patient Spontanous Breathing and Patient connected to nasal cannula oxygen  Post-op Assessment: Report given to RN, Post -op Vital signs reviewed and stable and Patient moving all extremities X 4  Post vital signs: Reviewed and stable  Last Vitals:  Vitals:   04/21/17 1202  BP: (!) 81/43  Pulse: 98  Resp: 20  Temp: 37.1 C    Last Pain:  Vitals:   04/21/17 1202  TempSrc: Oral         Complications: No apparent anesthesia complications

## 2017-04-21 NOTE — Anesthesia Preprocedure Evaluation (Addendum)
Anesthesia Evaluation  Patient identified by MRN, date of birth, ID band Patient awake    Reviewed: Allergy & Precautions, NPO status , Patient's Chart, lab work & pertinent test results  Airway Mallampati: III  TM Distance: >3 FB Neck ROM: Full    Dental no notable dental hx. (+) Dental Advidsory Given, Lower Dentures, Upper Dentures   Pulmonary neg pulmonary ROS, former smoker,    Pulmonary exam normal breath sounds clear to auscultation       Cardiovascular Exercise Tolerance: Good negative cardio ROS Normal cardiovascular exam Rhythm:Regular Rate:Normal     Neuro/Psych negative neurological ROS  negative psych ROS   GI/Hepatic Neg liver ROS, hiatal hernia, GERD  Medicated and Controlled,Crohns's disease with fistula   Endo/Other  negative endocrine ROS  Renal/GU Renal diseaseChronic kidney disease  negative genitourinary   Musculoskeletal negative musculoskeletal ROS (+)   Abdominal   Peds negative pediatric ROS (+)  Hematology negative hematology ROS (+)   Anesthesia Other Findings   Reproductive/Obstetrics negative OB ROS                          Anesthesia Physical Anesthesia Plan  ASA: II  Anesthesia Plan: General   Post-op Pain Management:    Induction: Intravenous  Airway Management Planned: LMA  Additional Equipment:   Intra-op Plan:   Post-operative Plan: Extubation in OR  Informed Consent: I have reviewed the patients History and Physical, chart, labs and discussed the procedure including the risks, benefits and alternatives for the proposed anesthesia with the patient or authorized representative who has indicated his/her understanding and acceptance.   Dental advisory given and Dental Advisory Given  Plan Discussed with: CRNA, Surgeon and Anesthesiologist  Anesthesia Plan Comments:       Anesthesia Quick Evaluation

## 2017-04-21 NOTE — Progress Notes (Signed)
Pt sister's number is [phone] [facility]

## 2017-04-22 ENCOUNTER — Encounter (HOSPITAL_COMMUNITY): Payer: Self-pay | Admitting: Surgery

## 2017-04-22 DIAGNOSIS — K50013 Crohn's disease of small intestine with fistula: Secondary | ICD-10-CM | POA: Diagnosis not present

## 2017-04-22 DIAGNOSIS — K604 Rectal fistula: Secondary | ICD-10-CM | POA: Diagnosis present

## 2017-04-22 DIAGNOSIS — Z87891 Personal history of nicotine dependence: Secondary | ICD-10-CM | POA: Diagnosis not present

## 2017-04-22 DIAGNOSIS — N183 Chronic kidney disease, stage 3 (moderate): Secondary | ICD-10-CM | POA: Diagnosis present

## 2017-04-22 DIAGNOSIS — G459 Transient cerebral ischemic attack, unspecified: Secondary | ICD-10-CM | POA: Diagnosis not present

## 2017-04-22 DIAGNOSIS — Z888 Allergy status to other drugs, medicaments and biological substances status: Secondary | ICD-10-CM | POA: Diagnosis not present

## 2017-04-22 DIAGNOSIS — Z885 Allergy status to narcotic agent status: Secondary | ICD-10-CM | POA: Diagnosis not present

## 2017-04-22 DIAGNOSIS — L409 Psoriasis, unspecified: Secondary | ICD-10-CM | POA: Diagnosis present

## 2017-04-22 DIAGNOSIS — K6289 Other specified diseases of anus and rectum: Secondary | ICD-10-CM | POA: Diagnosis present

## 2017-04-22 DIAGNOSIS — E876 Hypokalemia: Secondary | ICD-10-CM | POA: Diagnosis present

## 2017-04-22 DIAGNOSIS — R220 Localized swelling, mass and lump, head: Secondary | ICD-10-CM | POA: Diagnosis not present

## 2017-04-22 DIAGNOSIS — B373 Candidiasis of vulva and vagina: Secondary | ICD-10-CM | POA: Diagnosis present

## 2017-04-22 DIAGNOSIS — R748 Abnormal levels of other serum enzymes: Secondary | ICD-10-CM | POA: Diagnosis not present

## 2017-04-22 DIAGNOSIS — K219 Gastro-esophageal reflux disease without esophagitis: Secondary | ICD-10-CM | POA: Diagnosis present

## 2017-04-22 DIAGNOSIS — N179 Acute kidney failure, unspecified: Secondary | ICD-10-CM | POA: Diagnosis present

## 2017-04-22 DIAGNOSIS — R198 Other specified symptoms and signs involving the digestive system and abdomen: Secondary | ICD-10-CM | POA: Diagnosis not present

## 2017-04-22 DIAGNOSIS — K50913 Crohn's disease, unspecified, with fistula: Secondary | ICD-10-CM | POA: Diagnosis present

## 2017-04-22 DIAGNOSIS — Z881 Allergy status to other antibiotic agents status: Secondary | ICD-10-CM | POA: Diagnosis not present

## 2017-04-22 DIAGNOSIS — L0231 Cutaneous abscess of buttock: Secondary | ICD-10-CM | POA: Diagnosis present

## 2017-04-22 DIAGNOSIS — R0781 Pleurodynia: Secondary | ICD-10-CM | POA: Diagnosis not present

## 2017-04-22 DIAGNOSIS — K222 Esophageal obstruction: Secondary | ICD-10-CM | POA: Diagnosis present

## 2017-04-22 DIAGNOSIS — R079 Chest pain, unspecified: Secondary | ICD-10-CM | POA: Diagnosis not present

## 2017-04-22 DIAGNOSIS — R000 Tachycardia, unspecified: Secondary | ICD-10-CM | POA: Diagnosis not present

## 2017-04-22 DIAGNOSIS — N828 Other female genital tract fistulae: Secondary | ICD-10-CM | POA: Diagnosis present

## 2017-04-22 DIAGNOSIS — Z882 Allergy status to sulfonamides status: Secondary | ICD-10-CM | POA: Diagnosis not present

## 2017-04-22 DIAGNOSIS — K449 Diaphragmatic hernia without obstruction or gangrene: Secondary | ICD-10-CM | POA: Diagnosis present

## 2017-04-22 DIAGNOSIS — N823 Fistula of vagina to large intestine: Secondary | ICD-10-CM | POA: Diagnosis present

## 2017-04-22 DIAGNOSIS — I360 Nonrheumatic tricuspid (valve) stenosis: Secondary | ICD-10-CM | POA: Diagnosis not present

## 2017-04-22 DIAGNOSIS — D899 Disorder involving the immune mechanism, unspecified: Secondary | ICD-10-CM | POA: Diagnosis not present

## 2017-04-22 DIAGNOSIS — R0789 Other chest pain: Secondary | ICD-10-CM | POA: Diagnosis not present

## 2017-04-22 DIAGNOSIS — Z79899 Other long term (current) drug therapy: Secondary | ICD-10-CM | POA: Diagnosis not present

## 2017-04-22 DIAGNOSIS — R200 Anesthesia of skin: Secondary | ICD-10-CM | POA: Diagnosis not present

## 2017-04-22 DIAGNOSIS — K5090 Crohn's disease, unspecified, without complications: Secondary | ICD-10-CM | POA: Diagnosis present

## 2017-04-22 DIAGNOSIS — K611 Rectal abscess: Secondary | ICD-10-CM | POA: Diagnosis present

## 2017-04-22 DIAGNOSIS — D638 Anemia in other chronic diseases classified elsewhere: Secondary | ICD-10-CM | POA: Diagnosis present

## 2017-04-22 LAB — BASIC METABOLIC PANEL
Anion gap: 11 (ref 5–15)
Anion gap: 8 (ref 5–15)
BUN: 16 mg/dL (ref 6–20)
BUN: 12 mg/dL (ref 6–20)
CHLORIDE: 115 mmol/L — AB (ref 101–111)
CO2: 12 mmol/L — ABNORMAL LOW (ref 22–32)
CO2: 12 mmol/L — ABNORMAL LOW (ref 22–32)
CREATININE: 1.41 mg/dL — AB (ref 0.44–1.00)
Calcium: 6.6 mg/dL — ABNORMAL LOW (ref 8.9–10.3)
Calcium: 6.5 mg/dL — ABNORMAL LOW (ref 8.9–10.3)
Chloride: 118 mmol/L — ABNORMAL HIGH (ref 101–111)
Creatinine, Ser: 1.63 mg/dL — ABNORMAL HIGH (ref 0.44–1.00)
GFR, EST AFRICAN AMERICAN: 36 mL/min — AB (ref >60)
GFR, EST AFRICAN AMERICAN: 43 mL/min — AB (ref >60)
GFR, EST NON AFRICAN AMERICAN: 31 mL/min — AB (ref >60)
GFR, EST NON AFRICAN AMERICAN: 37 mL/min — AB (ref >60)
Glucose, Bld: 99 mg/dL (ref 65–99)
Glucose, Bld: 205 mg/dL — ABNORMAL HIGH (ref 65–99)
POTASSIUM: 2.1 mmol/L — AB (ref 3.5–5.1)
POTASSIUM: 3.3 mmol/L — AB (ref 3.5–5.1)
SODIUM: 138 mmol/L (ref 135–145)
SODIUM: 138 mmol/L (ref 135–145)

## 2017-04-22 LAB — CBC
HCT: 31.7 % — ABNORMAL LOW (ref 36.0–46.0)
Hemoglobin: 10.7 g/dL — ABNORMAL LOW (ref 12.0–15.0)
MCH: 26.1 pg (ref 26.0–34.0)
MCHC: 33.8 g/dL (ref 30.0–36.0)
MCV: 77.3 fL — ABNORMAL LOW (ref 78.0–100.0)
PLATELETS: 268 10*3/uL (ref 150–400)
RBC: 4.1 MIL/uL (ref 3.87–5.11)
RDW: 17.6 % — AB (ref 11.5–15.5)
WBC: 35.6 10*3/uL — AB (ref 4.0–10.5)

## 2017-04-22 MED ORDER — DICYCLOMINE HCL 10 MG/5ML PO SOLN
20.0000 mg | Freq: Three times a day (TID) | ORAL | Status: DC
  Administered 2017-04-22 – 2017-04-25 (×10): 20 mg via ORAL
  Filled 2017-04-22 (×23): qty 10

## 2017-04-22 MED ORDER — PROMETHAZINE HCL 25 MG/ML IJ SOLN
12.5000 mg | Freq: Four times a day (QID) | INTRAMUSCULAR | Status: DC | PRN
  Administered 2017-04-22 – 2017-04-29 (×13): 12.5 mg via INTRAVENOUS
  Filled 2017-04-22 (×13): qty 1

## 2017-04-22 MED ORDER — POTASSIUM CHLORIDE 10 MEQ/100ML IV SOLN
10.0000 meq | INTRAVENOUS | Status: DC
  Administered 2017-04-22 (×3): 10 meq via INTRAVENOUS
  Filled 2017-04-22 (×3): qty 100

## 2017-04-22 MED ORDER — POTASSIUM CHLORIDE 10 MEQ/100ML IV SOLN: 10.0000 meq | INTRAVENOUS | Status: DC

## 2017-04-22 MED ORDER — POTASSIUM CHLORIDE CRYS ER 20 MEQ PO TBCR
40.0000 meq | EXTENDED_RELEASE_TABLET | Freq: Three times a day (TID) | ORAL | Status: DC
  Filled 2017-04-22: qty 2

## 2017-04-22 MED ORDER — ENOXAPARIN SODIUM 30 MG/0.3ML ~~LOC~~ SOLN
30.0000 mg | Freq: Every day | SUBCUTANEOUS | Status: DC
  Administered 2017-04-22 – 2017-04-24 (×3): 30 mg via SUBCUTANEOUS
  Filled 2017-04-22 (×3): qty 0.3

## 2017-04-22 MED ORDER — POTASSIUM CHLORIDE 20 MEQ/15ML (10%) PO SOLN
40.0000 meq | Freq: Three times a day (TID) | ORAL | Status: AC
  Administered 2017-04-22 – 2017-04-24 (×9): 40 meq via ORAL
  Filled 2017-04-22 (×9): qty 30

## 2017-04-22 MED ORDER — POTASSIUM CHLORIDE CRYS ER 20 MEQ PO TBCR
40.0000 meq | EXTENDED_RELEASE_TABLET | Freq: Three times a day (TID) | ORAL | Status: DC
  Administered 2017-04-22: 40 meq via ORAL
  Filled 2017-04-22: qty 2

## 2017-04-22 MED ORDER — DICYCLOMINE HCL 20 MG PO TABS
20.0000 mg | ORAL_TABLET | Freq: Three times a day (TID) | ORAL | Status: DC
  Administered 2017-04-22: 20 mg via ORAL
  Filled 2017-04-22: qty 1

## 2017-04-22 MED ORDER — HYDROMORPHONE HCL 1 MG/ML IJ SOLN
1.0000 mg | INTRAMUSCULAR | Status: DC | PRN
  Administered 2017-04-22 – 2017-04-27 (×14): 1 mg via INTRAVENOUS
  Filled 2017-04-22 (×16): qty 1

## 2017-04-22 MED ORDER — DIPHENOXYLATE-ATROPINE 2.5-0.025 MG PO TABS
1.0000 | ORAL_TABLET | Freq: Four times a day (QID) | ORAL | Status: DC | PRN
  Administered 2017-04-22 – 2017-04-28 (×7): 1 via ORAL
  Filled 2017-04-22 (×7): qty 1

## 2017-04-22 NOTE — Progress Notes (Signed)
CRITICAL VALUE ALERT  Critical value received:  Potassium 2.1  Date of notification:  04/22/2017  Time of notification:  0529  Critical value read back:Yes.    Nurse who received alert:  Clydell Hakim RN  MD notified (1st page):  CCs  Time of first page:  0533  Responding MD:  Judeth Horn MD  Time MD responded:  [phone]

## 2017-04-22 NOTE — Progress Notes (Signed)
[facility] Progress Note  1 Day Post-Op  Subjective: CC: diarrhea and perirectal pain Pain controlled - pt states that it comes and goes. Denies fever, chills, nausea, or vomiting. Reports low potassium is "normal" for her, and says that "it goes up and down".   Objective: Vital signs in last 24 hours: Temp:  [97 F (36.1 C)-99.4 F (37.4 C)] 98.7 F (37.1 C) (05/16 2208) Pulse Rate:  [95-106] 95 (05/17 0447) Resp:  [16-28] 18 (05/17 0447) BP: (81-107)/(42-65) 91/42 (05/17 0447) SpO2:  [94 %-100 %] 100 % (05/17 0447) Weight:  [58.4 kg (128 lb 11.2 oz)] 58.4 kg (128 lb 11.2 oz) (05/16 1202) Last BM Date: 04/21/17  Intake/Output from previous day: 05/16 0701 - 05/17 0700 In: 1440 [I.V.:790; IV Piggyback:650] Out: 400 [Urine:400] Intake/Output this shift: No intake/output data recorded.  PE: Gen:  Alert, NAD, pleasant Card:  Regular rate and rhythm, pedal pulses 2+ BL Pulm:  Normal effort Rectal: left buttock with 2 cm incision, draining purulent/sanguinous fluid, left buttock with mild induration and no surrounding cellulitis. Dressing changed at bedside. Pt tolerated well.   Lab Results:   Recent Labs  04/21/17 1343 04/22/17 0424  WBC 44.9* 35.6*  HGB 12.4 10.7*  HCT 36.1 31.7*  PLT 336 268   BMET  Recent Labs  04/21/17 1343 04/22/17 0424  NA 139 138  K <2.0* 2.1*  CL 113* 115*  CO2 14* 12*  GLUCOSE 76 99  BUN 21* 16  CREATININE 1.71* 1.63*  CALCIUM 7.5* 6.6*   PT/INR No results for input(s): LABPROT, INR in the last 72 hours. CMP     Component Value Date/Time   NA 138 04/22/2017 0424   K 2.1 (LL) 04/22/2017 0424   CL 115 (H) 04/22/2017 0424   CO2 12 (L) 04/22/2017 0424   GLUCOSE 99 04/22/2017 0424   BUN 16 04/22/2017 0424   CREATININE 1.63 (H) 04/22/2017 0424   CREATININE 1.37 (H) 03/01/2017 1149   CALCIUM 6.6 (L) 04/22/2017 0424   PROT 8.0 02/03/2017 0310   ALBUMIN 3.8 02/03/2017 0310   AST 26 02/03/2017 0310   ALT 32  02/03/2017 0310   ALKPHOS 130 (H) 02/03/2017 0310   BILITOT 0.4 02/03/2017 0310   GFRNONAA 31 (L) 04/22/2017 0424   GFRNONAA 42 (L) 11/15/2013 0934   GFRAA 36 (L) 04/22/2017 0424   GFRAA 49 (L) 11/15/2013 0934   Lipase     Component Value Date/Time   LIPASE 20 02/03/2017 0310   Studies/Results: No results found.  Anti-infectives: Anti-infectives    Start     Dose/Rate Route Frequency Ordered Stop   04/21/17 1245  cefTRIAXone (ROCEPHIN) 2 g in dextrose 5 % 50 mL IVPB    Comments:  Pharmacy may adjust dosing strength / duration / interval for maximal efficacy   2 g 100 mL/hr over 30 Minutes Intravenous Daily 04/21/17 1226     04/21/17 1230  metroNIDAZOLE (FLAGYL) IVPB 500 mg     500 mg 100 mL/hr over 60 Minutes Intravenous Every 6 hours 04/21/17 1226       Assessment/Plan Perirectal abscess in the setting of crohn's disease S/P INCISION AND DRAINAGE PERIRECTAL ABSCESS 04/21/17 Dr. Coralie Keens  - BID wet-to-dry dressing changes  - CBC in AM  - continue IV abx  Diarrhea - re-ordered home meds Crohn's disease Anovaginal fistula  FEN: advance diet to SOFT, continue gently IVF today due to diarrhea, hypokalemia (replace with PO elixir and IV) repeat BMET this afternoon and AM.  ID: Rocephin/Flagyl 5/16>>  VTE: SCD's, start Lovenox     LOS: 1 day    [facility] 04/22/2017, 9:44 AM Pager: [phone] Consults: [phone] Mon-Fri 7:00 am-4:30 pm Sat-Sun 7:00 am-11:30 am

## 2017-04-23 ENCOUNTER — Other Ambulatory Visit: Payer: Self-pay

## 2017-04-23 ENCOUNTER — Inpatient Hospital Stay (HOSPITAL_COMMUNITY): Payer: Medicare HMO

## 2017-04-23 DIAGNOSIS — R000 Tachycardia, unspecified: Secondary | ICD-10-CM

## 2017-04-23 DIAGNOSIS — R0781 Pleurodynia: Secondary | ICD-10-CM

## 2017-04-23 DIAGNOSIS — I360 Nonrheumatic tricuspid (valve) stenosis: Secondary | ICD-10-CM

## 2017-04-23 DIAGNOSIS — R748 Abnormal levels of other serum enzymes: Secondary | ICD-10-CM

## 2017-04-23 LAB — CBC
HCT: 33.1 % — ABNORMAL LOW (ref 36.0–46.0)
Hemoglobin: 10.9 g/dL — ABNORMAL LOW (ref 12.0–15.0)
MCH: 25.7 pg — ABNORMAL LOW (ref 26.0–34.0)
MCHC: 32.9 g/dL (ref 30.0–36.0)
MCV: 78.1 fL (ref 78.0–100.0)
PLATELETS: 279 10*3/uL (ref 150–400)
RBC: 4.24 MIL/uL (ref 3.87–5.11)
RDW: 17.9 % — ABNORMAL HIGH (ref 11.5–15.5)
WBC: 29.3 10*3/uL — AB (ref 4.0–10.5)

## 2017-04-23 LAB — ECHOCARDIOGRAM COMPLETE
CHL CUP MV DEC (S): 227
CHL CUP RV SYS PRESS: 26 mmHg
CHL CUP TV REG PEAK VELOCITY: 238 cm/s
E decel time: 227 msec
FS: 28 % (ref 28–44)
Height: 64 in
IVS/LV PW RATIO, ED: 1.48
LADIAMINDEX: 1.85 cm/m2
LASIZE: 30 mm
LEFT ATRIUM END SYS DIAM: 30 mm
LV PW d: 8.33 mm — AB (ref 0.6–1.1)
LVOT SV: 49 mL
LVOT VTI: 17.3 cm
LVOT area: 2.84 cm2
LVOT peak vel: 98.3 cm/s
LVOTD: 19 mm
MV pk A vel: 93.1 m/s
MVPKEVEL: 67.9 m/s
TR max vel: 238 cm/s
WEIGHTICAEL: 2059.2 [oz_av]

## 2017-04-23 LAB — TROPONIN I
Troponin I: 0.06 ng/mL (ref <0.03)
Troponin I: 0.03 ng/mL (ref <0.03)
Troponin I: 0.05 ng/mL (ref <0.03)

## 2017-04-23 LAB — BASIC METABOLIC PANEL
ANION GAP: 8 (ref 5–15)
BUN: 7 mg/dL (ref 6–20)
CO2: 12 mmol/L — ABNORMAL LOW (ref 22–32)
Calcium: 7.1 mg/dL — ABNORMAL LOW (ref 8.9–10.3)
Chloride: 121 mmol/L — ABNORMAL HIGH (ref 101–111)
Creatinine, Ser: 1.45 mg/dL — ABNORMAL HIGH (ref 0.44–1.00)
GFR, EST AFRICAN AMERICAN: 42 mL/min — AB (ref >60)
GFR, EST NON AFRICAN AMERICAN: 36 mL/min — AB (ref >60)
Glucose, Bld: 102 mg/dL — ABNORMAL HIGH (ref 65–99)
POTASSIUM: 3 mmol/L — AB (ref 3.5–5.1)
SODIUM: 141 mmol/L (ref 135–145)

## 2017-04-23 MED ORDER — BOOST / RESOURCE BREEZE PO LIQD
1.0000 | Freq: Three times a day (TID) | ORAL | Status: DC
  Administered 2017-04-23 – 2017-04-28 (×11): 1 via ORAL

## 2017-04-23 MED ORDER — ACETAMINOPHEN 160 MG/5ML PO SOLN
500.0000 mg | Freq: Four times a day (QID) | ORAL | Status: DC
  Administered 2017-04-24 – 2017-04-25 (×3): 500 mg via ORAL
  Filled 2017-04-23 (×7): qty 20.3

## 2017-04-23 MED ORDER — OXYCODONE HCL 5 MG PO TABS: 5.0000 mg | ORAL_TABLET | ORAL | Status: DC | PRN

## 2017-04-23 MED ORDER — POTASSIUM CHLORIDE 10 MEQ/100ML IV SOLN
10.0000 meq | INTRAVENOUS | Status: AC
  Administered 2017-04-23 (×2): 10 meq via INTRAVENOUS
  Filled 2017-04-23 (×2): qty 100

## 2017-04-23 MED ORDER — ENSURE ENLIVE PO LIQD: 237.0000 mL | Freq: Three times a day (TID) | ORAL | Status: DC

## 2017-04-23 MED ORDER — HYDROCODONE-ACETAMINOPHEN 7.5-325 MG/15ML PO SOLN
10.0000 mL | ORAL | Status: DC | PRN
  Administered 2017-04-23 – 2017-04-27 (×4): 10 mL via ORAL
  Filled 2017-04-23 (×5): qty 15

## 2017-04-23 MED ORDER — ACETAMINOPHEN 500 MG PO TABS
500.0000 mg | ORAL_TABLET | Freq: Four times a day (QID) | ORAL | Status: DC
  Administered 2017-04-23: 500 mg via ORAL
  Filled 2017-04-23: qty 1

## 2017-04-23 MED ORDER — ADULT MULTIVITAMIN W/MINERALS CH
1.0000 | ORAL_TABLET | Freq: Every day | ORAL | Status: DC
  Administered 2017-04-24 – 2017-04-29 (×3): 1 via ORAL
  Filled 2017-04-23 (×6): qty 1

## 2017-04-23 MED ORDER — ACETAMINOPHEN 160 MG/5ML PO SOLN
500.0000 mg | Freq: Four times a day (QID) | ORAL | Status: DC | PRN
Start: 2017-04-23 — End: 2017-04-23

## 2017-04-23 MED ORDER — ACETAMINOPHEN 160 MG/5ML PO SOLN
500.0000 mg | Freq: Four times a day (QID) | ORAL | Status: DC
Start: 2017-04-23 — End: 2017-04-23

## 2017-04-23 NOTE — Progress Notes (Signed)
Lab called with a critical troponin level of 0.06. Will notified Obie Dredge, [place] with [facility].

## 2017-04-23 NOTE — Progress Notes (Signed)
Danielle [name], [place] with surgery told me patient was having some CP. When I assessed her she said she wasn't having that anymore but just some pain when she takes a deep breath in. Will obtain the EKG that was ordered.

## 2017-04-23 NOTE — Progress Notes (Signed)
[facility] Progress Note  2 Days Post-Op  Subjective: CC: chest pain Pt complaining of chest pain starting when she woke up this AM. Pain is located over her sternum/left anterior ribcage. Described as sharp pain, not pressure. Non-radiating. No alleviating or exacerbating factors. No associated SOB or palpitations. Denies dizziness or diaphoresis.  Rates perirectal pain as 8/10. Tolerating PO. Having diarrhea.   WBC trending down (29), and hypokalemia slightly improved (3.0)  Afebrile, VSS  HR 141 bpm this AM noted.  Objective: Vital signs in last 24 hours: Temp:  [97.8 F (36.6 C)-99.2 F (37.3 C)] 98.3 F (36.8 C) (05/18 0546) Pulse Rate:  [41-141] 141 (05/18 0546) Resp:  [18] 18 (05/18 0546) BP: (85-99)/(52-69) 96/55 (05/18 0546) SpO2:  [94 %-100 %] 94 % (05/18 0546) Last BM Date: 04/22/17  Intake/Output from previous day: 05/17 0701 - 05/18 0700 In: 3289.2 [P.O.:240; I.V.:2399.2; IV Piggyback:650] Out: -  Intake/Output this shift: Total I/O In: 195.8 [I.V.:195.8] Out: -   PE: Gen:  Alert, NAD, pleasant Card:  Regular rate and rhythm, pedal pulses 2+ BL Pulm:  Normal effort Rectal: left buttock with 2 cm incision, dressing c/d/i, left buttock with mild induration and no surrounding cellulitis.   Lab Results:   Recent Labs  04/22/17 0424 04/23/17 0642  WBC 35.6* 29.3*  HGB 10.7* 10.9*  HCT 31.7* 33.1*  PLT 268 279   BMET  Recent Labs  04/22/17 1452 04/23/17 0642  NA 138 141  K 3.3* 3.0*  CL 118* 121*  CO2 12* 12*  GLUCOSE 205* 102*  BUN 12 7  CREATININE 1.41* 1.45*  CALCIUM 6.5* 7.1*   PT/INR No results for input(s): LABPROT, INR in the last 72 hours. CMP     Component Value Date/Time   NA 141 04/23/2017 0642   K 3.0 (L) 04/23/2017 0642   CL 121 (H) 04/23/2017 0642   CO2 12 (L) 04/23/2017 0642   GLUCOSE 102 (H) 04/23/2017 0642   BUN 7 04/23/2017 0642   CREATININE 1.45 (H) 04/23/2017 0642   CREATININE 1.37 (H) 03/01/2017  1149   CALCIUM 7.1 (L) 04/23/2017 0642   PROT 8.0 02/03/2017 0310   ALBUMIN 3.8 02/03/2017 0310   AST 26 02/03/2017 0310   ALT 32 02/03/2017 0310   ALKPHOS 130 (H) 02/03/2017 0310   BILITOT 0.4 02/03/2017 0310   GFRNONAA 36 (L) 04/23/2017 0642   GFRNONAA 42 (L) 11/15/2013 0934   GFRAA 42 (L) 04/23/2017 0642   GFRAA 49 (L) 11/15/2013 0934   Lipase     Component Value Date/Time   LIPASE 20 02/03/2017 0310       Studies/Results: No results found.  Anti-infectives: Anti-infectives    Start     Dose/Rate Route Frequency Ordered Stop   04/21/17 1245  cefTRIAXone (ROCEPHIN) 2 g in dextrose 5 % 50 mL IVPB    Comments:  Pharmacy may adjust dosing strength / duration / interval for maximal efficacy   2 g 100 mL/hr over 30 Minutes Intravenous Daily 04/21/17 1226     04/21/17 1230  metroNIDAZOLE (FLAGYL) IVPB 500 mg     500 mg 100 mL/hr over 60 Minutes Intravenous Every 6 hours 04/21/17 1226         Assessment/Plan Perirectal abscess in the setting of crohn's disease S/P INCISION AND DRAINAGE PERIRECTAL ABSCESS 04/21/17 Dr. Coralie Keens  - BID wet-to-dry dressing changes  - CBC in AM  - continue IV abx - mobilize   Chest pain - will check troponin  and 12 lead EKG Diarrhea -home meds  Crohn's disease Anovaginal fistula   ZFP:OIPP, continue gentle IVF, replace potassium, repeat BMET in AM.  ID: Rocephin/Flagyl 5/16>>  VTE: SCD's,  Lovenox    LOS: 2 days    Danielle [name] , [name] 04/23/2017, 8:17 AM Pager: [phone] Consults: [phone] Mon-Fri 7:00 am-4:30 pm Sat-Sun 7:00 am-11:30 am

## 2017-04-23 NOTE — Progress Notes (Signed)
  Echocardiogram 2D Echocardiogram has been performed.  [name] 04/23/2017, 4:01 PM

## 2017-04-23 NOTE — Progress Notes (Addendum)
Critical troponin noted in pt labs, values were elevated previously. No active orders for Troponin. Pt stated chest pain with deep breaths decreases comparing to previous pain, VS stable. Cardiology notified.

## 2017-04-23 NOTE — Consult Note (Signed)
Cardiology Consult    Patient ID: Danielle [name] MRN: [id number], DOB/AGE: [birth year]   Admit date: 04/21/2017 Date of Consult: 04/23/2017  Primary Physician: Iona Beard, MD Primary Cardiologist: Dr. Claiborne Billings Requesting Provider: Dr. Ninfa Linden  Reason for Consult: chest pain, abnormal EKG  Patient Profile    Danielle [name] is a 71 yo with a PMH significant for crohn's disease, GERD, and CKD stage III was admitted with a perirectal abscess. She was taken to the OR for I & D. On POD #1, she reported waking up with chest pain.   Danielle [name] is a 71 y.o. female who is being seen today for the evaluation of chest pain at the request of Dr. Ninfa Linden.   Past Medical History   Past Medical History:  Diagnosis Date  . Crohn disease ([place])   . Hiatal hernia 12/07/2015  . Psoriasis     Past Surgical History:  Procedure Laterality Date  . ANAL STENOSIS  ~ 2010  . APPENDECTOMY  1980's  . BOWEL RESECTION  2009  . CHOLECYSTECTOMY  1990's  . COLONOSCOPY  2013  . COLONOSCOPY WITH PROPOFOL N/A 03/12/2017   Procedure: COLONOSCOPY WITH PROPOFOL;  Surgeon: Rogene Houston, MD;  [facility];  Service: Endoscopy;  Laterality: N/A;  2:05-moved up to 1025 per Lelon Frohlich  . COLOSTOMY  2009   DEMASO  . COLOSTOMY TAKEDOWN  2009   "only had it for about 1 month" (08/22/2013)  . ESOPHAGEAL DILATION N/A 03/12/2017   Procedure: ESOPHAGEAL DILATION;  Surgeon: Rogene Houston, MD;  [facility];  Service: Endoscopy;  Laterality: N/A;  . ESOPHAGOGASTRODUODENOSCOPY  11/11/2011   Procedure: ESOPHAGOGASTRODUODENOSCOPY (EGD);  Surgeon: Missy Sabins, MD;  Location: [facility] ENDOSCOPY;  Service: Endoscopy;  Laterality: N/A;  . ESOPHAGOGASTRODUODENOSCOPY  05/18/2012   Procedure: ESOPHAGOGASTRODUODENOSCOPY (EGD);  Surgeon: Rogene Houston, MD;  [facility];  Service: Endoscopy;  Laterality: N/A;  200  . ESOPHAGOGASTRODUODENOSCOPY (EGD) WITH PROPOFOL N/A 03/12/2017   Procedure:  ESOPHAGOGASTRODUODENOSCOPY (EGD) WITH PROPOFOL;  Surgeon: Rogene Houston, MD;  [facility];  Service: Endoscopy;  Laterality: N/A;  . FLEXIBLE SIGMOIDOSCOPY  11/11/2011   Procedure: FLEXIBLE SIGMOIDOSCOPY;  Surgeon: Missy Sabins, MD;  Location: [place];  Service: Endoscopy;  Laterality: N/A;  . goiter  1999  . INCISION AND DRAINAGE PERIRECTAL ABSCESS N/A 04/21/2017   Procedure: IRRIGATION AND DEBRIDEMENT PERIRECTAL ABSCESS;  Surgeon: Coralie Keens, MD;  Location: [place];  Service: General;  Laterality: N/A;  . LEFT HEART CATHETERIZATION WITH CORONARY ANGIOGRAM N/A 07/09/2014   Procedure: LEFT HEART CATHETERIZATION WITH CORONARY ANGIOGRAM;  Surgeon: Peter M Martinique, MD;  Location: [facility] CATH LAB;  Service: Cardiovascular;  Laterality: N/A;  . PROCTOSCOPY N/A 01/31/2015   Procedure: RIGID PROCTOSCOPY;  Surgeon: Leighton Ruff, MD;  Location: [place];  Service: General;  Laterality: N/A;  . RECTAL EXAM UNDER ANESTHESIA N/A 01/31/2015   Procedure: RECTAL EXAM UNDER ANESTHESIA WITH PLACEMENT OF A SETON;  Surgeon: Leighton Ruff, MD;  Location: [employer];  Service: General;  Laterality: N/A;     Allergies  Allergies  Allergen Reactions  . Zithromax [Azithromycin] Swelling and Other (See Comments)    Patient states that she has a swollen mouth/ blisters, yeast present mouth and vaginia.  . Aspirin Other (See Comments)    Cramps her stomach  . Ibuprofen Other (See Comments)    Patient preference  . Sulfa Antibiotics Nausea And Vomiting    History of Present Illness  Danielle [name] presented to [facility] hospital with a perirectal abscess and anovaginal fistula. She was taken to the OR on 04/21/17 for incision and drainage. Following surgery on POD #2, she woke up complaining of substernal chest pain. EKG with artifiact, but ST changes in inferior leads and ST depression in V1 and V2. Troponin mildly elevated at 0.06. She states the pain is present when she takes a deep breath in, but is not present  when she isn't breathing deeply. It is not changed with swallowing. She denies SOB, vomiting, palpitations, dizziness, lightheadedness, and pre-syncope. She does not become diaphoretic with this chest discomfort. She states its a sharp stabbing pain.   Inpatient Medications    . acetaminophen (TYLENOL) oral liquid 160 mg/5 mL  500 mg Oral Q6H  . dicyclomine  20 mg Oral TID AC  . enoxaparin (LOVENOX) injection  30 mg Subcutaneous Daily  . potassium chloride  40 mEq Oral TID     Outpatient Medications    Prior to Admission medications   Medication Sig Start Date End Date Taking? Authorizing Provider  acetaminophen (TYLENOL) 500 MG tablet Take 1,000 mg by mouth every 6 (six) hours as needed for mild pain or moderate pain.   Yes [provider]  Calcium Carbonate-Vitamin D (CALCIUM + D PO) Take 1 tablet by mouth daily.   Yes [provider]  Cyanocobalamin (VITAMIN B-12 IJ) Inject 1 each as directed every 30 (thirty) days. Around the 1st week of each month   Yes [provider]  dicyclomine (BENTYL) 10 MG/5ML syrup TAKE 10 MLS (20 MG TOTAL) BY MOUTH 3 (THREE) TIMES DAILY AS NEEDED. Patient taking differently: Take 20 mg by mouth 3 (three) times daily as needed (for stomach).  03/18/17  Yes Rehman, Mechele Dawley, MD  diphenoxylate-atropine (LOMOTIL) 2.5-0.025 MG tablet TAKE 1 TABLET BY MOUTH 4 TIMES A DAY AS NEEDED FOR DIARRHEA/LOOSE STOOL 03/31/17  Yes Rehman, Mechele Dawley, MD  inFLIXimab (REMICADE) 100 MG injection Inject 100 mg into the vein every 6 (six) weeks. Next is due 03-11-2017   Yes [provider]  megestrol (MEGACE) 40 MG/ML suspension Take 5 mLs (200 mg total) by mouth daily. 04/09/17  Yes Rehman, Mechele Dawley, MD  Multiple Vitamin (MULTIVITAMIN WITH MINERALS) TABS Take 1 tablet by mouth daily.   Yes [provider]  nystatin (NYSTATIN) powder USE TO AFFECTED AREA TWICE DAILY Patient taking differently: Apply 1 g topically 2 (two) times daily as needed. USE  TO AFFECTED FOR ITCHING 02/18/17  Yes Rehman, Mechele Dawley, MD  nystatin cream (MYCOSTATIN) Apply 1 application topically 2 (two) times daily as needed for dry skin. 02/18/17  Yes Rehman, Mechele Dawley, MD  pantoprazole (PROTONIX) 40 MG tablet TAKE 1 TABLET (40 MG TOTAL) BY MOUTH 2 (TWO) TIMES DAILY BEFORE A MEAL. 05/25/16  Yes Setzer, Terri L, NP  potassium chloride SA (K-DUR,KLOR-CON) 20 MEQ tablet Take 1 tablet (20 mEq total) by mouth daily. Take potassium on days you take furosemide. 04/14/17  Yes Rehman, Mechele Dawley, MD  promethazine (PHENERGAN) 25 MG tablet Take 1 tablet (25 mg total) by mouth 2 (two) times daily as needed. Patient taking differently: Take 25 mg by mouth 2 (two) times daily as needed for nausea or vomiting.  02/18/17  Yes Rehman, Mechele Dawley, MD  triamcinolone cream (KENALOG) 0.1 % APPLY TO AFFECTED AREA TWICE A DAY AS NEEDED 03/10/17  Yes Rehman, Mechele Dawley, MD  Vitamin D, Ergocalciferol, (DRISDOL) 50000 units CAPS capsule Take 1 capsule (50,000 Units  total) by mouth every 7 (seven) days. Patient taking differently: Take 50,000 Units by mouth every 7 (seven) days. On Mondays 07/19/16  Yes Rehman, Mechele Dawley, MD  albuterol (PROVENTIL HFA;VENTOLIN HFA) 108 (90 Base) MCG/ACT inhaler Inhale 2 puffs into the lungs every 6 (six) hours as needed for wheezing. 12/09/15   Rama, Venetia Maxon, MD  furosemide (LASIX) 40 MG tablet Take 1 tablet (40 mg total) by mouth daily as needed for edema. 06/30/16   Rogene Houston, MD  HYDROcodone-acetaminophen (NORCO) 7.5-325 MG tablet Take 1 tablet by mouth every 4 (four) hours as needed for moderate pain. 10/16/16   Dorie Rank, MD  spironolactone (ALDACTONE) 50 MG tablet Take 1 tablet (50 mg total) by mouth daily. Patient not taking: Reported on 04/22/2017 12/09/15   Rama, Venetia Maxon, MD     Family History     Family History  Problem Relation Age of Onset  . Diabetes Mother   . Heart disease Father   . Healthy Sister   . Hypertension Brother   . Colon cancer Brother         died with colon cancer  . Crohn's disease Paternal Aunt     Social History    Social History   Social History  . Marital status: Single    Spouse name: N/A  . Number of children: N/A  . Years of education: N/A   Occupational History  . Not on file.   Social History Main Topics  . Smoking status: Former Smoker    Packs/day: 0.12    Years: 4.00    Types: Cigarettes    Quit date: 10/05/1996  . Smokeless tobacco: Never Used     Comment: 08/22/2013 1 pack every two weeks when she did smoked  . Alcohol use No  . Drug use: No  . Sexual activity: Not Currently    Birth control/ protection: Post-menopausal, Implant   Other Topics Concern  . Not on file   Social History Narrative   Lives in [place].  Moved from [place] in 2008.  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  LIves with her sister.   No home health services.       Review of Systems    General:  No chills, fever, night sweats or weight changes.  Cardiovascular:  + chest pain with deep inspiration, No dyspnea on exertion, edema, orthopnea, palpitations, paroxysmal nocturnal dyspnea. Dermatological: No rash, lesions/masses Respiratory: No cough, dyspnea Urologic: No hematuria, dysuria Abdominal:   No nausea, vomiting, diarrhea, bright red blood per rectum, melena, or hematemesis Neurologic:  No visual changes, changes in mental status. All other systems reviewed and are otherwise negative except as noted above.  Physical Exam    Blood pressure (!) 96/55, pulse (!) 141, temperature 98.3 F (36.8 C), temperature source Oral, resp. rate 18, height 5\' 4"  (1.626 m), weight 128 lb 11.2 oz (58.4 kg), SpO2 94 %.  General: Pleasant, NAD Psych: Normal affect. Neuro: Alert and oriented X 3. Moves all extremities spontaneously. HEENT: Normal  Neck: Supple without bruits or JVD. Lungs:  Resp regular and unlabored, CTA. Heart: Regular rhythm, tachycardic rate, no murmurs. Abdomen: Soft, non-tender, non-distended, BS + x 4.    Extremities: No clubbing, cyanosis, trace edema. DP/PT/Radials 1+ and equal bilaterally.  Labs    Troponin (Point of Care Test) No results for input(s): TROPIPOC in the last 72 hours.  Recent Labs  04/23/17 1043  TROPONINI 0.06*   Lab Results  Component Value Date  WBC 29.3 (H) 04/23/2017   HGB 10.9 (L) 04/23/2017   HCT 33.1 (L) 04/23/2017   MCV 78.1 04/23/2017   PLT 279 04/23/2017    Recent Labs Lab 04/23/17 0642  NA 141  K 3.0*  CL 121*  CO2 12*  BUN 7  CREATININE 1.45*  CALCIUM 7.1*  GLUCOSE 102*   Lab Results  Component Value Date   CHOL 172 07/09/2014   HDL 104 07/09/2014   LDLCALC 54 07/09/2014   TRIG 68 07/09/2014   Lab Results  Component Value Date   DDIMER 2.38 (H) 02/24/2013     Radiology Studies    No results found.  ECG & Cardiac Imaging    EKG 04/23/17: sinus tachycardia, ST-T wave changes, ST depression V1/2  Left Heart Cath 07/09/14: Final Conclusions:   1. No significant CAD 2. Normal LV function.  Recommendations: Medical management.  Assessment & Plan    1. Chest pain, abnormal EKG, elevated troponin - troponin 0.06 - EKG - repeat with similar artifact This chest pain sounds pleuritic in nature, since she only feels the pain with deep inspiration. She has not had this type of pain before. She has a history of GERD and esophageal dilation. She states it is unchanged with swallowing. She has had poor PO intake since surgery secondary to pain and nausea, likely contributing to her tachycardia. She had no significant CAD per cath in 2015. She does not smoke, no HTN or HLD and she is not overweight. I have low suspicion for ACS at this time. However, will continue to trend troponins. Will obtain CXR to rule out acute abnormalities. Bedside echocardiogram. Would have low suspicion for PE given her chest pain and tachycardia.    2. Acute on CKD stage III - sCr 1.45, baseline appears 1.1-1.3 - per primary team   Signed, Ledora Bottcher, PA-C 04/23/2017, 12:25 PM [phone]  I have seen and examined the patient along with Ledora Bottcher, PA-C.  I have reviewed the chart, notes and new data.  I agree with PA's note.  Key new complaints: chest pain is sharp and pleuritic. It is higher than her usual hiatal hernia related discomfort. No cough, no hemoptysis, no subjective dyspnea. Key examination changes: she is mildly tachycardic and mildly tachypneic; clear lungs, no leg edema or tenderness. Normal CV exam except tachycardia Key new findings / data: ECG shows sinus tachycardia and LAFB. Leads V 1 and V2 were probably placed too highon the chest, otherwise no change from 2017 tracing. Labs reviewed. CXR pending. Minimal increase in troponin.  PLAN: Pain may be musculoskeletal, but I am concerned about possible pulmonary embolism. D-dimer will not be helpful due to recent surgery. Check echo for signs of RV strain.   Sanda Klein, MD, [place] [phone] 04/23/2017, 2:32 PM

## 2017-04-23 NOTE — Progress Notes (Signed)
Initial Nutrition Assessment  DOCUMENTATION CODES:   Not applicable  INTERVENTION:   -Boost Breeze po TID, each supplement provides 250 kcal and 9 grams of protein -MVI daily  NUTRITION DIAGNOSIS:   Increased nutrient needs related to wound healing as evidenced by estimated needs.  GOAL:   Patient will meet greater than or equal to 90% of their needs  MONITOR:   PO intake, Supplement acceptance, Labs, Weight trends, Skin  REASON FOR ASSESSMENT:   Malnutrition Screening Tool    ASSESSMENT:   The patient is a 71 year old female who presented to our [facility] office with new left perirectal pain.   Pt admitted with perirectal abscess in the setting of crohn's disease.   S/p Procedure(s) on 04/21/17: INCISION AND DRAINAGE PERIRECTAL ABSCESS  Pt on phone at time of visit and refused RD interview and exam.   Noted multiple cups of liquids and juices on tray table. No documented meal completion available for review.   Reviewed wt hx; noted pt has experienced a 23# (15.2%) wt loss over the past 3 months, which is significant for time frame.   Unable to identify malnutrition at this time, however, pt is at high risk given increased nutrient needs for wound healing and significant wt loss.   Labs reviewed.   Diet Order:  DIET SOFT Room service appropriate? Yes; Fluid consistency: Thin  Skin:  Wound (see comment) (per-rectal abscess s/p I&D)  Last BM:  04/22/17  Height:   Ht Readings from Last 1 Encounters:  04/21/17 5\' 4"  (1.626 m)    Weight:   Wt Readings from Last 1 Encounters:  04/21/17 128 lb 11.2 oz (58.4 kg)    Ideal Body Weight:  54.5 kg  BMI:  Body mass index is 22.09 kg/m.  Estimated Nutritional Needs:   Kcal:  8676-7209  Protein:  90-105 grams  Fluid:  >1.7 L  EDUCATION NEEDS:   No education needs identified at this time  Anthoney Sheppard A. Jimmye Norman, RD, LDN, CDE Pager: [phone] After hours Pager: [phone]

## 2017-04-24 DIAGNOSIS — R0789 Other chest pain: Secondary | ICD-10-CM

## 2017-04-24 LAB — BASIC METABOLIC PANEL
Anion gap: 6 (ref 5–15)
BUN: 5 mg/dL — ABNORMAL LOW (ref 6–20)
CALCIUM: 6.8 mg/dL — AB (ref 8.9–10.3)
CHLORIDE: 124 mmol/L — AB (ref 101–111)
CO2: 11 mmol/L — ABNORMAL LOW (ref 22–32)
CREATININE: 1.44 mg/dL — AB (ref 0.44–1.00)
GFR calc Af Amer: 42 mL/min — ABNORMAL LOW (ref >60)
GFR calc non Af Amer: 36 mL/min — ABNORMAL LOW (ref >60)
Glucose, Bld: 93 mg/dL (ref 65–99)
Potassium: 3 mmol/L — ABNORMAL LOW (ref 3.5–5.1)
SODIUM: 141 mmol/L (ref 135–145)

## 2017-04-24 LAB — CBC
HCT: 29.8 % — ABNORMAL LOW (ref 36.0–46.0)
Hemoglobin: 9.9 g/dL — ABNORMAL LOW (ref 12.0–15.0)
MCH: 25.8 pg — ABNORMAL LOW (ref 26.0–34.0)
MCHC: 33.2 g/dL (ref 30.0–36.0)
MCV: 77.6 fL — AB (ref 78.0–100.0)
PLATELETS: 265 10*3/uL (ref 150–400)
RBC: 3.84 MIL/uL — ABNORMAL LOW (ref 3.87–5.11)
RDW: 17.8 % — AB (ref 11.5–15.5)
WBC: 21.7 10*3/uL — AB (ref 4.0–10.5)

## 2017-04-24 MED ORDER — POTASSIUM CHLORIDE CRYS ER 20 MEQ PO TBCR: 20.0000 meq | EXTENDED_RELEASE_TABLET | Freq: Two times a day (BID) | ORAL | Status: DC

## 2017-04-24 MED ORDER — ENOXAPARIN SODIUM 40 MG/0.4ML ~~LOC~~ SOLN
40.0000 mg | Freq: Every day | SUBCUTANEOUS | Status: DC
  Administered 2017-04-25: 40 mg via SUBCUTANEOUS
  Filled 2017-04-24: qty 0.4

## 2017-04-24 MED ORDER — POTASSIUM CHLORIDE CRYS ER 20 MEQ PO TBCR
20.0000 meq | EXTENDED_RELEASE_TABLET | Freq: Two times a day (BID) | ORAL | Status: DC
  Administered 2017-04-25 – 2017-04-27 (×6): 20 meq via ORAL
  Filled 2017-04-24 (×7): qty 1

## 2017-04-24 MED ORDER — MENTHOL 3 MG MT LOZG
1.0000 | LOZENGE | OROMUCOSAL | Status: DC | PRN
  Administered 2017-04-24: 3 mg via ORAL
  Filled 2017-04-24 (×3): qty 9

## 2017-04-24 NOTE — Progress Notes (Signed)
[facility]/Trauma Progress Note  3 Days Post-Op   Subjective:  CC: buttock is sore, sore throat, breathing  Pt states her throat is sore and is having difficulty swallowing. She continues to endorse difficulty breathing but was able to sleep well last night and had no issues with her breathing while asleep. She is still having mild chest discomfort. No acute events overnight.   Objective: Vital signs in last 24 hours: Temp:  [97.6 F (36.4 C)-99.6 F (37.6 C)] 99.6 F (37.6 C) (05/19 0508) Pulse Rate:  [79-103] 103 (05/19 0508) Resp:  [18-20] 20 (05/19 0508) BP: (91)/(46-55) 91/51 (05/19 0508) SpO2:  [100 %] 100 % (05/19 0508) Last BM Date: 04/23/17  Intake/Output from previous day: 05/18 0701 - 05/19 0700 In: 6333 [I.V.:1025; IV Piggyback:550] Out: 300 [Urine:300] Intake/Output this shift: No intake/output data recorded.  PE: Gen:  Alert, NAD, pleasant, cooperative, appears slightly anxious Card:  Mild tachycardia, regular rhythm, no M/G/R heard Pulm:  effort normal rate slightly increased at times Skin: no rashes noted, warm and dry Rectal: wound C/D/I  Lab Results:   Recent Labs  04/23/17 0642 04/24/17 0415  WBC 29.3* 21.7*  HGB 10.9* 9.9*  HCT 33.1* 29.8*  PLT 279 265   BMET  Recent Labs  04/23/17 0642 04/24/17 0415  NA 141 141  K 3.0* 3.0*  CL 121* 124*  CO2 12* 11*  GLUCOSE 102* 93  BUN 7 5*  CREATININE 1.45* 1.44*  CALCIUM 7.1* 6.8*   PT/INR No results for input(s): LABPROT, INR in the last 72 hours. CMP     Component Value Date/Time   NA 141 04/24/2017 0415   K 3.0 (L) 04/24/2017 0415   CL 124 (H) 04/24/2017 0415   CO2 11 (L) 04/24/2017 0415   GLUCOSE 93 04/24/2017 0415   BUN 5 (L) 04/24/2017 0415   CREATININE 1.44 (H) 04/24/2017 0415   CREATININE 1.37 (H) 03/01/2017 1149   CALCIUM 6.8 (L) 04/24/2017 0415   PROT 8.0 02/03/2017 0310   ALBUMIN 3.8 02/03/2017 0310   AST 26 02/03/2017 0310   ALT 32 02/03/2017 0310   ALKPHOS 130 (H) 02/03/2017 0310   BILITOT 0.4 02/03/2017 0310   GFRNONAA 36 (L) 04/24/2017 0415   GFRNONAA 42 (L) 11/15/2013 0934   GFRAA 42 (L) 04/24/2017 0415   GFRAA 49 (L) 11/15/2013 0934   Lipase     Component Value Date/Time   LIPASE 20 02/03/2017 0310    Studies/Results: Dg Chest 2 View  Result Date: 04/23/2017 CLINICAL DATA:  Chest pain EXAM: CHEST  2 VIEW COMPARISON:  02/03/2017, 10/16/2016, 10/06/2016 FINDINGS: Large hiatal hernia containing dilated bowel, increased compared to 10/16/2016, similar compared with 09/18/2016. No pleural effusion or focal consolidation. Stable cardiomediastinal silhouette. No pneumothorax. Surgical clips in the right upper quadrant. IMPRESSION: No focal consolidation or effusion. Large hiatal hernia containing air filled dilated bowel. Electronically Signed   By: Donavan Foil M.D.   On: 04/23/2017 17:40    Anti-infectives: Anti-infectives    Start     Dose/Rate Route Frequency Ordered Stop   04/21/17 1245  cefTRIAXone (ROCEPHIN) 2 g in dextrose 5 % 50 mL IVPB    Comments:  Pharmacy may adjust dosing strength / duration / interval for maximal efficacy   2 g 100 mL/hr over 30 Minutes Intravenous Daily 04/21/17 1226     04/21/17 1230  metroNIDAZOLE (FLAGYL) IVPB 500 mg     500 mg 100 mL/hr over 60 Minutes Intravenous Every 6 hours 04/21/17 1226  Assessment/Plan Perirectal abscess in the setting of crohn's disease S/P INCISION AND DRAINAGE PERIRECTAL ABSCESS 04/21/17 Dr. Coralie Keens  - BID wet-to-dry dressing changes  - WBC still elevated, AM labs  - continue IV abx - mobilize   Chest pain - card following  Diarrhea -home meds  Crohn's disease Anovaginal fistula    GSU:PJSR, continue gentle IVF, replace potassium IV and tomorrow 5/20 switch to PO, AM labs ID: Rocephin/Flagyl 5/16>> VTE: SCD's,  Lovenox    DISPO: continue dressing changes, ECHO showed normal RV function. Cards following and appreciate their  assistance. Cepacol lozenges for sore throat    LOS: 3 days    Kalman Drape , [facility] 04/24/2017, 9:42 AM Pager: [phone] Consults: [phone] Mon-Fri 7:00 am-4:30 pm Sat-Sun 7:00 am-11:30 am

## 2017-04-24 NOTE — Progress Notes (Signed)
Progress Note  Patient Name: Danielle [name] Date of Encounter: 04/24/2017  Primary Cardiologist: Dr. Ellouise Newer  Subjective   Had sore throat and chest pain earlier today and mild trouble swallowing but this has improved. No significant SOB currently. She feels better.   Inpatient Medications    Scheduled Meds: . acetaminophen (TYLENOL) oral liquid 160 mg/5 mL  500 mg Oral Q6H  . dicyclomine  20 mg Oral TID AC  . enoxaparin (LOVENOX) injection  30 mg Subcutaneous Daily  . feeding supplement  1 Container Oral TID BM  . multivitamin with minerals  1 tablet Oral Daily  . potassium chloride  40 mEq Oral TID  . [START ON 04/25/2017] potassium chloride  20 mEq Oral BID   Continuous Infusions: . cefTRIAXone (ROCEPHIN)  IV 2 g (04/24/17 0842)  . dextrose 5 % and 0.45 % NaCl with KCl 20 mEq/L 50 mL/hr at 04/24/17 0851  . metronidazole 500 mg (04/24/17 1317)   PRN Meds: diphenoxylate-atropine, HYDROcodone-acetaminophen, HYDROmorphone (DILAUDID) injection, menthol-cetylpyridinium, ondansetron **OR** ondansetron (ZOFRAN) IV, polyethylene glycol, promethazine, simethicone   Vital Signs    Vitals:   04/23/17 0546 04/23/17 1446 04/23/17 2147 04/24/17 0508  BP: (!) 96/55 (!) 91/55 (!) 91/46 (!) 91/51  Pulse: (!) 141 (!) 103 79 (!) 103  Resp: 18 18 20 20   Temp: 98.3 F (36.8 C) 97.6 F (36.4 C) 98.4 F (36.9 C) 99.6 F (37.6 C)  TempSrc: Oral Oral Oral Oral  SpO2: 94% 100%  100%  Weight:      Height:        Intake/Output Summary (Last 24 hours) at 04/24/17 1341 Last data filed at 04/24/17 1000  Gross per 24 hour  Intake             1195 ml  Output              300 ml  Net              895 ml   Filed Weights   04/21/17 1202  Weight: 128 lb 11.2 oz (58.4 kg)    Telemetry    Sinus tachycardia - Personally Reviewed  ECG    Sinus tachycardia, left anterior fascicular block, no ischemic changes - Personally Reviewed  Physical Exam   GEN: No acute distress.   Neck: No  JVD Cardiac: Tachycardia regular, no murmurs, rubs, or gallops.  Respiratory: Clear to auscultation bilaterally. Mild increased rate of breathing . GI: Soft, nontender, non-distended  MS: No edema; No deformity. Neuro:  Nonfocal  Psych: Normal affect   Labs    Chemistry Recent Labs Lab 04/22/17 1452 04/23/17 0642 04/24/17 0415  NA 138 141 141  K 3.3* 3.0* 3.0*  CL 118* 121* 124*  CO2 12* 12* 11*  GLUCOSE 205* 102* 93  BUN 12 7 5*  CREATININE 1.41* 1.45* 1.44*  CALCIUM 6.5* 7.1* 6.8*  GFRNONAA 37* 36* 36*  GFRAA 43* 42* 42*  ANIONGAP 8 8 6      Hematology Recent Labs Lab 04/22/17 0424 04/23/17 0642 04/24/17 0415  WBC 35.6* 29.3* 21.7*  RBC 4.10 4.24 3.84*  HGB 10.7* 10.9* 9.9*  HCT 31.7* 33.1* 29.8*  MCV 77.3* 78.1 77.6*  MCH 26.1 25.7* 25.8*  MCHC 33.8 32.9 33.2  RDW 17.6* 17.9* 17.8*  PLT 268 279 265    Cardiac Enzymes Recent Labs Lab 04/23/17 1043 04/23/17 1454 04/23/17 1953  TROPONINI 0.06* 0.03* 0.05*   No results for input(s): TROPIPOC in the last 168 hours.  BNPNo results for input(s): BNP, PROBNP in the last 168 hours.   DDimer No results for input(s): DDIMER in the last 168 hours.   Radiology    Dg Chest 2 View  Result Date: 04/23/2017 CLINICAL DATA:  Chest pain EXAM: CHEST  2 VIEW COMPARISON:  02/03/2017, 10/16/2016, 10/06/2016 FINDINGS: Large hiatal hernia containing dilated bowel, increased compared to 10/16/2016, similar compared with 09/18/2016. No pleural effusion or focal consolidation. Stable cardiomediastinal silhouette. No pneumothorax. Surgical clips in the right upper quadrant. IMPRESSION: No focal consolidation or effusion. Large hiatal hernia containing air filled dilated bowel. Electronically Signed   By: Donavan Foil M.D.   On: 04/23/2017 17:40    Cardiac Studies   Echocardiogram 04/23/17  - Left ventricle: The cavity size was normal. Wall thickness was   normal. Systolic function was normal. The estimated ejection    fraction was in the range of 55% to 60%. Doppler parameters are   consistent with abnormal left ventricular relaxation (grade 1   diastolic dysfunction).  Impressions:  - Extremely limited due to poor sound wave transmission; LV   function appears to be preserved; focal wall motion abnormality   cannot be excluded; mild diastolic dysfunction; right heart not   well visualized but appears to be normal in size with normal RV   function.  Patient Profile     71 y.o. female with minimally elevated flat troponin 0.06, chest pain, no significant CAD per catheterization in 2015.  Assessment & Plan    Atypical chest pain  - Low level troponin, not ACS (acute coronary syndrome)  - This is likely demand ischemia in the setting of her underlying medical illness  - Reassuring echocardiogram with normal left ventricular ejection fraction of 60%.  - Overall right ventricle appears to be normal in size.  - Continue to replete potassium which is currently 3.0.  - Creatinine currently 1.44.  - Symptoms could be GI related.  - She feels better today.   We will sign off. Please let us know if we can be of further assistance. No further cardiac testing.  Signed, Candee Furbish, MD  04/24/2017, 1:41 PM

## 2017-04-24 NOTE — Evaluation (Signed)
Physical Therapy Evaluation Patient Details Name: Danielle [name] MRN: [id number] DOB: [birth year] Today's Date: 04/24/2017   History of Present Illness  pt is a 71 yo female s/p incision and drainage of perirectal abscess. Pt has a history of GERD and Crohn's disease.   Clinical Impression  Pt presented with overall decreased mobility with transfers and gait due to increased pain from incision site.  Pt requires extra time for bed mobility but does not need any assistance. PT provided supervision during gait with IV pole and provided cues to decreased dependence with contralateral hand on rail in hallway. Pt would benefit from continued skilled PT to address decreased activity tolerance with mobility.     Follow Up Recommendations Home health PT;Supervision - Intermittent    Equipment Recommendations  None recommended by PT    Recommendations for Other Services       Precautions / Restrictions Precautions Precautions: Fall (Contact) Restrictions Weight Bearing Restrictions: No      Mobility  Bed Mobility Overal bed mobility: Modified Independent             General bed mobility comments: increased time and use of hand rails  Transfers Overall transfer level: Modified independent Equipment used: None                Ambulation/Gait Ambulation/Gait assistance: Supervision Ambulation Distance (Feet): 100 Feet Assistive device:  (IV pole ) Gait Pattern/deviations: Step-through pattern;Decreased stride length;Narrow base of support Gait velocity: decreased Gait velocity interpretation: Below normal speed for age/gender General Gait Details: pt presented with decreased gait speed and used IV pole to improve stability. Supervision required for safety   Stairs            Wheelchair Mobility    Modified Rankin (Stroke Patients Only)       Balance Overall balance assessment: Needs assistance Sitting-balance support: No upper extremity supported;Feet  supported Sitting balance-Leahy Scale: Fair     Standing balance support: No upper extremity supported;During functional activity Standing balance-Leahy Scale: Fair Standing balance comment: Pt able to maintain standing balance while PT assisted with donning robe.                              Pertinent Vitals/Pain Pain Assessment: 0-10 Pain Score: 7  Pain Location: left buttock/incision area Pain Descriptors / Indicators: Aching;Sore Pain Intervention(s): Monitored during session    Home Living Family/patient expects to be discharged to:: Private residence Living Arrangements: Other relatives Available Help at Discharge: Family;Available PRN/intermittently Type of Home: House Home Access: Stairs to enter   [employer] of Steps: 1 Home Layout: Multi-level        Prior Function Level of Independence: Independent with assistive device(s)         Comments: Pt used SPC when ambulating in community     Hand Dominance   Dominant Hand: Right    Extremity/Trunk Assessment   Upper Extremity Assessment Upper Extremity Assessment: Overall WFL for tasks assessed    Lower Extremity Assessment Lower Extremity Assessment: Overall WFL for tasks assessed       Communication   Communication: No difficulties  Cognition Arousal/Alertness: Awake/alert Behavior During Therapy: WFL for tasks assessed/performed Overall Cognitive Status: Within Functional Limits for tasks assessed  General Comments      Exercises     Assessment/Plan    PT Assessment Patient needs continued PT services  PT Problem List Decreased strength;Decreased activity tolerance;Decreased balance;Decreased mobility;Decreased coordination;Decreased safety awareness       PT Treatment Interventions DME instruction;Gait training;Stair training;Functional mobility training;Therapeutic activities;Therapeutic exercise;Balance  training;Neuromuscular re-education;Patient/family education    PT Goals (Current goals can be found in the Care Plan section)  Acute Rehab PT Goals Patient Stated Goal: to go home PT Goal Formulation: With patient Time For Goal Achievement: 05/08/17 Potential to Achieve Goals: Good    Frequency Min 3X/week   Barriers to discharge        Co-evaluation               AM-PAC PT "6 Clicks" Daily Activity  Outcome Measure Difficulty turning over in bed (including adjusting bedclothes, sheets and blankets)?: A Little Difficulty moving from lying on back to sitting on the side of the bed? : A Little Difficulty sitting down on and standing up from a chair with arms (e.g., wheelchair, bedside commode, etc,.)?: A Little Help needed moving to and from a bed to chair (including a wheelchair)?: A Little Help needed walking in hospital room?: A Little Help needed climbing 3-5 steps with a railing? : A Little 6 Click Score: 18    End of Session Equipment Utilized During Treatment: Gait belt Activity Tolerance: Patient tolerated treatment well Patient left: in bed;with call bell/phone within reach Nurse Communication: Mobility status PT Visit Diagnosis: Unsteadiness on feet (R26.81);Difficulty in walking, not elsewhere classified (R26.2)    Time: 0350-0938 PT Time Calculation (min) (ACUTE ONLY): 18 min   Charges:   PT Evaluation $PT Eval Moderate Complexity: 1 Procedure     PT G CodesLoma Sousa, SPT  [phone]  Loma Sousa 04/24/2017, 2:36 PM

## 2017-04-25 ENCOUNTER — Inpatient Hospital Stay (HOSPITAL_COMMUNITY): Payer: Medicare HMO

## 2017-04-25 DIAGNOSIS — R200 Anesthesia of skin: Secondary | ICD-10-CM

## 2017-04-25 DIAGNOSIS — G459 Transient cerebral ischemic attack, unspecified: Secondary | ICD-10-CM

## 2017-04-25 DIAGNOSIS — L0231 Cutaneous abscess of buttock: Secondary | ICD-10-CM

## 2017-04-25 DIAGNOSIS — R220 Localized swelling, mass and lump, head: Secondary | ICD-10-CM

## 2017-04-25 LAB — CBC
HEMATOCRIT: 31.2 % — AB (ref 36.0–46.0)
HEMOGLOBIN: 10.3 g/dL — AB (ref 12.0–15.0)
MCH: 25.7 pg — AB (ref 26.0–34.0)
MCHC: 33 g/dL (ref 30.0–36.0)
MCV: 77.8 fL — ABNORMAL LOW (ref 78.0–100.0)
Platelets: 270 10*3/uL (ref 150–400)
RBC: 4.01 MIL/uL (ref 3.87–5.11)
RDW: 17.9 % — AB (ref 11.5–15.5)
WBC: 24.2 10*3/uL — ABNORMAL HIGH (ref 4.0–10.5)

## 2017-04-25 LAB — LIPID PANEL
Cholesterol: 73 mg/dL (ref 0–200)
HDL: 40 mg/dL — ABNORMAL LOW (ref >40)
LDL CALC: 24 mg/dL (ref 0–99)
Total CHOL/HDL Ratio: 1.8 RATIO
Triglycerides: 43 mg/dL (ref <150)
VLDL: 9 mg/dL (ref 0–40)

## 2017-04-25 LAB — BASIC METABOLIC PANEL
Anion gap: 11 (ref 5–15)
CALCIUM: 6.9 mg/dL — AB (ref 8.9–10.3)
CHLORIDE: 123 mmol/L — AB (ref 101–111)
CO2: 8 mmol/L — AB (ref 22–32)
CREATININE: 1.59 mg/dL — AB (ref 0.44–1.00)
GFR calc Af Amer: 37 mL/min — ABNORMAL LOW (ref >60)
GFR calc non Af Amer: 32 mL/min — ABNORMAL LOW (ref >60)
Glucose, Bld: 106 mg/dL — ABNORMAL HIGH (ref 65–99)
Potassium: 2.9 mmol/L — ABNORMAL LOW (ref 3.5–5.1)
SODIUM: 142 mmol/L (ref 135–145)

## 2017-04-25 LAB — VITAMIN B12: VITAMIN B 12: 1232 pg/mL — AB (ref 180–914)

## 2017-04-25 LAB — TSH: TSH: 1.153 u[IU]/mL (ref 0.350–4.500)

## 2017-04-25 MED ORDER — FLUCONAZOLE 100 MG PO TABS
100.0000 mg | ORAL_TABLET | Freq: Every day | ORAL | Status: DC
  Administered 2017-04-26 – 2017-04-27 (×2): 100 mg via ORAL
  Filled 2017-04-25 (×2): qty 1

## 2017-04-25 MED ORDER — ENOXAPARIN SODIUM 30 MG/0.3ML ~~LOC~~ SOLN
30.0000 mg | Freq: Every day | SUBCUTANEOUS | Status: DC
  Administered 2017-04-26 – 2017-04-29 (×4): 30 mg via SUBCUTANEOUS
  Filled 2017-04-25 (×4): qty 0.3

## 2017-04-25 MED ORDER — IOPAMIDOL (ISOVUE-300) INJECTION 61%
INTRAVENOUS | Status: AC
  Filled 2017-04-25: qty 75

## 2017-04-25 MED ORDER — FLUCONAZOLE IN SODIUM CHLORIDE 200-0.9 MG/100ML-% IV SOLN
200.0000 mg | Freq: Once | INTRAVENOUS | Status: AC
  Administered 2017-04-25: 200 mg via INTRAVENOUS
  Filled 2017-04-25: qty 100

## 2017-04-25 MED ORDER — ASPIRIN EC 81 MG PO TBEC
81.0000 mg | DELAYED_RELEASE_TABLET | Freq: Every day | ORAL | Status: DC
  Administered 2017-04-25 – 2017-04-29 (×4): 81 mg via ORAL
  Filled 2017-04-25 (×5): qty 1

## 2017-04-25 NOTE — Progress Notes (Signed)
22G IV in pt's left hand was very painful to pt upon hand flushing. Peripheral access was difficult as explained to me by pt's RN. Scan not completed due to inadequate IV access. RN notified and pt returned to unit.

## 2017-04-25 NOTE — Progress Notes (Signed)
Dressing change completed per order. Moderate  serous sanguinous drainage noted. No odor noted. Wound pink and granulation tissue present. Patient tolerated well. Will continue to monitor patient.

## 2017-04-25 NOTE — Progress Notes (Signed)
[facility]/Trauma Progress Note  4 Days Post-Op   Subjective:  CC: sore throat  States sore throat is improving. No chest pain today or SOB. Neurology consulted overnight for facial tingling and droop that resolved.   Objective: Vital signs in last 24 hours: Temp:  [98.4 F (36.9 C)-101.1 F (38.4 C)] 98.9 F (37.2 C) (05/20 0515) Pulse Rate:  [82-111] 98 (05/20 0515) Resp:  [18-40] 18 (05/20 0515) BP: (90-111)/(53-85) 92/62 (05/20 0515) SpO2:  [90 %-98 %] 98 % (05/20 0515) Last BM Date: 04/24/17  Intake/Output from previous day: 05/19 0701 - 05/20 0700 In: 1587.5 [P.O.:240; I.V.:897.5; IV Piggyback:450] Out: 1700 [Urine:1700] Intake/Output this shift: Total I/O In: -  Out: 200 [Urine:200]  PE: Gen:  Alert, NAD, pleasant, cooperative, well appearing Card:  RRR, no M/G/R heard Pulm:  CTA, rate and effort normal  Abd: +BS, not distended, nontender Skin: no rashes noted, warm and dry Rectal: wound dressed  Lab Results:   Recent Labs  04/24/17 0415 04/25/17 0321  WBC 21.7* 24.2*  HGB 9.9* 10.3*  HCT 29.8* 31.2*  PLT 265 270   BMET  Recent Labs  04/24/17 0415 04/25/17 0321  NA 141 142  K 3.0* 2.9*  CL 124* 123*  CO2 11* 8*  GLUCOSE 93 106*  BUN 5* <5*  CREATININE 1.44* 1.59*  CALCIUM 6.8* 6.9*   PT/INR No results for input(s): LABPROT, INR in the last 72 hours. CMP     Component Value Date/Time   NA 142 04/25/2017 0321   K 2.9 (L) 04/25/2017 0321   CL 123 (H) 04/25/2017 0321   CO2 8 (L) 04/25/2017 0321   GLUCOSE 106 (H) 04/25/2017 0321   BUN <5 (L) 04/25/2017 0321   CREATININE 1.59 (H) 04/25/2017 0321   CREATININE 1.37 (H) 03/01/2017 1149   CALCIUM 6.9 (L) 04/25/2017 0321   PROT 8.0 02/03/2017 0310   ALBUMIN 3.8 02/03/2017 0310   AST 26 02/03/2017 0310   ALT 32 02/03/2017 0310   ALKPHOS 130 (H) 02/03/2017 0310   BILITOT 0.4 02/03/2017 0310   GFRNONAA 32 (L) 04/25/2017 0321   GFRNONAA 42 (L) 11/15/2013 0934   GFRAA 37 (L)  04/25/2017 0321   GFRAA 49 (L) 11/15/2013 0934   Lipase     Component Value Date/Time   LIPASE 20 02/03/2017 0310    Studies/Results: Dg Chest 2 View  Result Date: 04/23/2017 CLINICAL DATA:  Chest pain EXAM: CHEST  2 VIEW COMPARISON:  02/03/2017, 10/16/2016, 10/06/2016 FINDINGS: Large hiatal hernia containing dilated bowel, increased compared to 10/16/2016, similar compared with 09/18/2016. No pleural effusion or focal consolidation. Stable cardiomediastinal silhouette. No pneumothorax. Surgical clips in the right upper quadrant. IMPRESSION: No focal consolidation or effusion. Large hiatal hernia containing air filled dilated bowel. Electronically Signed   By: Donavan Foil M.D.   On: 04/23/2017 17:40   Ct Head Wo Contrast  Result Date: 04/25/2017 CLINICAL DATA:  Right facial numbness EXAM: CT HEAD WITHOUT CONTRAST TECHNIQUE: Contiguous axial images were obtained from the base of the skull through the vertex without intravenous contrast. COMPARISON:  Head CT 08/31/2011 FINDINGS: Brain: No mass lesion, intraparenchymal hemorrhage or extra-axial collection. No evidence of acute cortical infarct. 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 Brain parenchyma is otherwise normal for age. Vascular: No hyperdense vessel or unexpected calcification. Skull: Normal visualized skull base, calvarium and extracranial soft tissues. Sinuses/Orbits: No sinus fluid levels or advanced mucosal thickening. No mastoid effusion. Normal orbits. 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 No acute intracranial abnormality. Electronically Signed   By: Ulyses Jarred M.D.   On: 04/25/2017 03:43    Anti-infectives: Anti-infectives    Start     Dose/Rate Route Frequency Ordered Stop   04/21/17 1245  cefTRIAXone (ROCEPHIN) 2 g in dextrose 5 % 50 mL IVPB    Comments:  Pharmacy may adjust dosing strength / duration / interval for maximal efficacy   2 g 100 mL/hr over  30 Minutes Intravenous Daily 04/21/17 1226     04/21/17 1230  metroNIDAZOLE (FLAGYL) IVPB 500 mg     500 mg 100 mL/hr over 60 Minutes Intravenous Every 6 hours 04/21/17 1226         Assessment/Plan Perirectal abscess in the setting of crohn's disease S/P INCISION AND DRAINAGE PERIRECTAL ABSCESS 04/21/17 Dr. Coralie Keens  - BID wet-to-dry dressing changes  - WBC still elevated, AM labs  - continue IV abx - mobilize   Chest pain - cards has signed off, ECHO normal, CP has resolved Diarrhea -home meds Hypokalemia - PO replenishment, 2.9 today  Crohn's disease Anovaginal fistula Stroke workup - pt had facial tingling and droop overnight that resolved after 5 min, Hx of stroke, neurology following, CT neg, MRI, MRA and carotid pending. Placed pt on ASA   FEN: SOFT, continue gentleIVF, replace potassium PO,AM labs ID: Rocephin/Flagyl 5/16>> VTE: SCD's, Lovenox    DISPO: continue dressing changes, chest pain resolved and cards signed off. Cepacol lozenges for sore throat and is improving. Neurology involved for possible stroke workup.     LOS: 4 days    Kalman Drape , [facility] Surgery 04/25/2017, 9:53 AM Pager: [phone] Consults: [phone] Mon-Fri 7:00 am-4:30 pm Sat-Sun 7:00 am-11:30 am

## 2017-04-25 NOTE — Progress Notes (Addendum)
Patient complaining of tingling in her face. Patient is alert and oriented. Nurse noticed slight droop on patient's right cheek   during earlier assessment however patient stated that is normal for her when dentures are not in.  No neurological deficits. Rapid response nurse called and at bedside. On call neurologist paged. Order for stat head CT.  Patient back form CT. In bed resting. No complaints at this time. Will continue to monitor patient.

## 2017-04-25 NOTE — Significant Event (Signed)
Rapid Response Event Note  Overview: Time Called: 0242 Arrival Time: 0215 Event Type: Neurologic  Initial Focused Assessment:   Called by Lexine Baton, RN for pt c/o right facial numbness and tingling onset 0240 lasting 5 minutes, self resolving. Right facial droop noted by pt's nurse Danae Chen.  Upon my arrival pt sitting in bed, alert, oriented, PERL 61mm, speech clear, numbnesss, tingling and right facial droop resolved.  MAE equally.  VS: 98.6 oral temp  90/53  103 ST 18 RR with 98% O2 sats on RA   Interventions: CT head wo contrast per Dr Nicole Kindred No acute intracranial abnormality.  Unchanged left internal capsule lacunar infarct compared to 08/31/2011  Plan of Care (if not transferred):Instructed pt to notify nursing staff for return of numbness and tingling to face. Hand off to [place], RN neuro checks q 2 hrs  And to call for any concerns. Will follow as needed  Event Summary: Name of Physician Notified: Dr Wallie Char at 0250    at    Outcome: Stayed in room and stabalized     Cylie Dor, Gust Brooms

## 2017-04-25 NOTE — Consult Note (Signed)
Admission H&P    Chief Complaint: Transient facial numbness.  HPI: Danielle [name] is an 71 y.o. female with a history of previous left lacunar infarction, psoriasis, Crohn's disease and hiatal hernia who was admitted on 04/21/2017 for incision and drainage of perirectal abscess. Neurology consultation was obtained following acute onset of numbness and tingling involving the right side of her face at 2:40 AM today. There was equivocal right facial droop as well. She had no sensory changes involving extremities. There was no change in speech. CT scan of her head without contrast was obtained which showed no acute intracranial abnormality. Symptoms resolved after 5 minutes and did not recur. She has not been on antiplatelet therapy. She also was not hypertensive and does not have known diabetes mellitus.  LSN: 2:40 AM on 04/25/2017 tPA Given: No: Symptoms rapidly resolved mRankin:  Past Medical History:  Diagnosis Date  . Crohn disease ([place])   . Hiatal hernia 12/07/2015  . Psoriasis     Past Surgical History:  Procedure Laterality Date  . ANAL STENOSIS  ~ 2010  . APPENDECTOMY  1980's  . BOWEL RESECTION  2009  . CHOLECYSTECTOMY  1990's  . COLONOSCOPY  2013  . COLONOSCOPY WITH PROPOFOL N/A 03/12/2017   Procedure: COLONOSCOPY WITH PROPOFOL;  Surgeon: Rogene Houston, MD;  Location: [facility];  Service: Endoscopy;  Laterality: N/A;  2:05-moved up to 1025 per Lelon Frohlich  . COLOSTOMY  2009   DEMASO  . COLOSTOMY TAKEDOWN  2009   "only had it for about 1 month" (08/22/2013)  . ESOPHAGEAL DILATION N/A 03/12/2017   Procedure: ESOPHAGEAL DILATION;  Surgeon: Rogene Houston, MD;  Location: [facility];  Service: Endoscopy;  Laterality: N/A;  . ESOPHAGOGASTRODUODENOSCOPY  11/11/2011   Procedure: ESOPHAGOGASTRODUODENOSCOPY (EGD);  Surgeon: Missy Sabins, MD;  Location: [facility] ENDOSCOPY;  Service: Endoscopy;  Laterality: N/A;  . ESOPHAGOGASTRODUODENOSCOPY  05/18/2012   Procedure: ESOPHAGOGASTRODUODENOSCOPY  (EGD);  Surgeon: Rogene Houston, MD;  Location: [facility];  Service: Endoscopy;  Laterality: N/A;  200  . ESOPHAGOGASTRODUODENOSCOPY (EGD) WITH PROPOFOL N/A 03/12/2017   Procedure: ESOPHAGOGASTRODUODENOSCOPY (EGD) WITH PROPOFOL;  Surgeon: Rogene Houston, MD;  Location: [facility];  Service: Endoscopy;  Laterality: N/A;  . FLEXIBLE SIGMOIDOSCOPY  11/11/2011   Procedure: FLEXIBLE SIGMOIDOSCOPY;  Surgeon: Missy Sabins, MD;  Location: [place];  Service: Endoscopy;  Laterality: N/A;  . goiter  1999  . INCISION AND DRAINAGE PERIRECTAL ABSCESS N/A 04/21/2017   Procedure: IRRIGATION AND DEBRIDEMENT PERIRECTAL ABSCESS;  Surgeon: Coralie Keens, MD;  Location: [place];  Service: General;  Laterality: N/A;  . LEFT HEART CATHETERIZATION WITH CORONARY ANGIOGRAM N/A 07/09/2014   Procedure: LEFT HEART CATHETERIZATION WITH CORONARY ANGIOGRAM;  Surgeon: Peter M Martinique, MD;  Location: [facility] CATH LAB;  Service: Cardiovascular;  Laterality: N/A;  . PROCTOSCOPY N/A 01/31/2015   Procedure: RIGID PROCTOSCOPY;  Surgeon: Leighton Ruff, MD;  Location: [place];  Service: General;  Laterality: N/A;  . RECTAL EXAM UNDER ANESTHESIA N/A 01/31/2015   Procedure: RECTAL EXAM UNDER ANESTHESIA WITH PLACEMENT OF A SETON;  Surgeon: Leighton Ruff, MD;  Location: [facility];  Service: General;  Laterality: N/A;    Family History  Problem Relation Age of Onset  . Diabetes Mother   . Heart disease Father   . Healthy Sister   . Hypertension Brother   . Colon cancer Brother        died with colon cancer  . Crohn's disease Paternal Aunt    Social  History:  reports that she quit smoking about 20 years ago. Her smoking use included Cigarettes. She has a 0.48 pack-year smoking history. She has never used smokeless tobacco. She reports that she does not drink alcohol or use drugs.  Allergies:  Allergies  Allergen Reactions  . Zithromax [Azithromycin] Swelling and Other (See Comments)    Patient states that she has a swollen mouth/  blisters, yeast present mouth and vaginia.  . Aspirin Other (See Comments)    Cramps her stomach  . Ibuprofen Other (See Comments)    Patient preference  . Sulfa Antibiotics Nausea And Vomiting    Medications Prior to Admission  Medication Sig Dispense Refill  . acetaminophen (TYLENOL) 500 MG tablet Take 1,000 mg by mouth every 6 (six) hours as needed for mild pain or moderate pain.    . Calcium Carbonate-Vitamin D (CALCIUM + D PO) Take 1 tablet by mouth daily.    . Cyanocobalamin (VITAMIN B-12 IJ) Inject 1 each as directed every 30 (thirty) days. Around the 1st week of each month    . dicyclomine (BENTYL) 10 MG/5ML syrup TAKE 10 MLS (20 MG TOTAL) BY MOUTH 3 (THREE) TIMES DAILY AS NEEDED. (Patient taking differently: Take 20 mg by mouth 3 (three) times daily as needed (for stomach). ) 300 mL 3  . diphenoxylate-atropine (LOMOTIL) 2.5-0.025 MG tablet TAKE 1 TABLET BY MOUTH 4 TIMES A DAY AS NEEDED FOR DIARRHEA/LOOSE STOOL 90 tablet 0  . inFLIXimab (REMICADE) 100 MG injection Inject 100 mg into the vein every 6 (six) weeks. Next is due 03-11-2017    . megestrol (MEGACE) 40 MG/ML suspension Take 5 mLs (200 mg total) by mouth daily. 150 mL 2  . Multiple Vitamin (MULTIVITAMIN WITH MINERALS) TABS Take 1 tablet by mouth daily.    [name] nystatin (NYSTATIN) powder USE TO AFFECTED AREA TWICE DAILY (Patient taking differently: Apply 1 g topically 2 (two) times daily as needed. USE TO AFFECTED FOR ITCHING) 60 g 2  . nystatin cream (MYCOSTATIN) Apply 1 application topically 2 (two) times daily as needed for dry skin. 30 g 1  . pantoprazole (PROTONIX) 40 MG tablet TAKE 1 TABLET (40 MG TOTAL) BY MOUTH 2 (TWO) TIMES DAILY BEFORE A MEAL. 180 tablet 3  . potassium chloride SA (K-DUR,KLOR-CON) 20 MEQ tablet Take 1 tablet (20 mEq total) by mouth daily. Take potassium on days you take furosemide. 30 tablet 1  . promethazine (PHENERGAN) 25 MG tablet Take 1 tablet (25 mg total) by mouth 2 (two) times daily as needed.  (Patient taking differently: Take 25 mg by mouth 2 (two) times daily as needed for nausea or vomiting. ) 20 tablet 0  . triamcinolone cream (KENALOG) 0.1 % APPLY TO AFFECTED AREA TWICE A DAY AS NEEDED 30 g 1  . Vitamin D, Ergocalciferol, (DRISDOL) 50000 units CAPS capsule Take 1 capsule (50,000 Units total) by mouth every 7 (seven) days. (Patient taking differently: Take 50,000 Units by mouth every 7 (seven) days. On Mondays) 24 capsule 0  . albuterol (PROVENTIL HFA;VENTOLIN HFA) 108 (90 Base) MCG/ACT inhaler Inhale 2 puffs into the lungs every 6 (six) hours as needed for wheezing. 1 Inhaler 2  . furosemide (LASIX) 40 MG tablet Take 1 tablet (40 mg total) by mouth daily as needed for edema. 30 tablet 1  . HYDROcodone-acetaminophen (NORCO) 7.5-325 MG tablet Take 1 tablet by mouth every 4 (four) hours as needed for moderate pain. 6 tablet 0  . spironolactone (ALDACTONE) 50 MG tablet Take 1 tablet (50  mg total) by mouth daily. (Patient not taking: Reported on 04/22/2017) 60 tablet 5    ROS: History obtained from the patient  General ROS: negative for - chills, fatigue, fever, night sweats, weight gain or weight loss Psychological ROS: negative for - behavioral disorder, hallucinations, memory difficulties, mood swings or suicidal ideation Ophthalmic ROS: negative for - blurry vision, double vision, eye pain or loss of vision ENT ROS: negative for - epistaxis, nasal discharge, oral lesions, sore throat, tinnitus or vertigo Allergy and Immunology ROS: negative for - hives or itchy/watery eyes Hematological and Lymphatic ROS: negative for - bleeding problems, bruising or swollen lymph nodes Endocrine ROS: negative for - galactorrhea, hair pattern changes, polydipsia/polyuria or temperature intolerance Respiratory ROS: negative for - cough, hemoptysis, shortness of breath or wheezing Cardiovascular ROS: negative for - chest pain, dyspnea on exertion, edema or irregular heartbeat Gastrointestinal ROS:  negative for - abdominal pain, diarrhea, hematemesis, nausea/vomiting or stool incontinence Genito-Urinary ROS: negative for - dysuria, hematuria, incontinence or urinary frequency/urgency Musculoskeletal ROS: negative for - joint swelling or muscular weakness Neurological ROS: as noted in HPI Dermatological ROS: Perirectal abscess as noted in present illness  Physical Examination: Blood pressure 92/62, pulse 98, temperature 98.9 F (37.2 C), temperature source Oral, resp. rate 18, height 5' 4"  (1.626 m), weight 58.4 kg (128 lb 11.2 oz), SpO2 98 %.  HEENT-  Normocephalic, no lesions, without obvious abnormality.  Normal external eye and conjunctiva.  Normal TM's bilaterally.  Normal auditory canals and external ears. Normal external nose, mucus membranes and septum.  Normal pharynx. Neck supple with no masses, nodes, nodules or enlargement. Cardiovascular - regular rate and rhythm, S1, S2 normal, no murmur, click, rub or gallop Lungs - chest clear, no wheezing, rales, normal symmetric air entry Abdomen - soft, non-tender; bowel sounds normal; no masses,  no organomegaly Extremities - no joint deformities, effusion, or inflammation and no edema  Neurologic Examination: Mental Status: Alert, oriented, thought content appropriate.  Speech fluent without evidence of aphasia. Able to follow commands without difficulty. Cranial Nerves: II-Visual fields were normal. III/IV/VI-Pupils were equal and reacted. Extraocular movements were full and conjugate.    V/VII-slightly reduced perception of tactile sensation over the right side of the face compared to the left; asymmetry of lower face with no demonstrable facial weakness. VIII-normal. X-normal speech. XI: trapezius strength/neck flexion strength normal bilaterally XII-midline tongue extension with normal strength. Motor: 5/5 bilaterally with normal tone and bulk Sensory: Normal throughout. Deep Tendon Reflexes: Trace to 1+ and  symmetric. Plantars: Flexor bilaterally Cerebellar: Normal finger-to-nose testing.  Results for orders placed or performed during the hospital encounter of 04/21/17 (from the past 48 hour(s))  CBC     Status: Abnormal   Collection Time: 04/23/17  6:42 AM  Result Value Ref Range   WBC 29.3 (H) 4.0 - 10.5 K/uL   RBC 4.24 3.87 - 5.11 MIL/uL   Hemoglobin 10.9 (L) 12.0 - 15.0 g/dL   HCT 33.1 (L) 36.0 - 46.0 %   MCV 78.1 78.0 - 100.0 fL   MCH 25.7 (L) 26.0 - 34.0 pg   MCHC 32.9 30.0 - 36.0 g/dL   RDW 17.9 (H) 11.5 - 15.5 %   Platelets 279 150 - 400 K/uL  Basic metabolic panel     Status: Abnormal   Collection Time: 04/23/17  6:42 AM  Result Value Ref Range   Sodium 141 135 - 145 mmol/L   Potassium 3.0 (L) 3.5 - 5.1 mmol/L   Chloride 121 (H) 101 -  111 mmol/L   CO2 12 (L) 22 - 32 mmol/L   Glucose, Bld 102 (H) 65 - 99 mg/dL   BUN 7 6 - 20 mg/dL   Creatinine, Ser 1.45 (H) 0.44 - 1.00 mg/dL   Calcium 7.1 (L) 8.9 - 10.3 mg/dL   GFR calc non Af Amer 36 (L) >60 mL/min   GFR calc Af Amer 42 (L) >60 mL/min    Comment: (NOTE) The eGFR has been calculated using the CKD EPI equation. This calculation has not been validated in all clinical situations. eGFR's persistently <60 mL/min signify possible Chronic Kidney Disease.    Anion gap 8 5 - 15  Troponin I     Status: Abnormal   Collection Time: 04/23/17 10:43 AM  Result Value Ref Range   Troponin I 0.06 (HH) <0.03 ng/mL    Comment: CRITICAL RESULT CALLED TO, READ BACK BY AND VERIFIED WITH: WALDON,K RN @ 5681 04/23/17 LEONARD,A   Troponin I     Status: Abnormal   Collection Time: 04/23/17  2:54 PM  Result Value Ref Range   Troponin I 0.03 (HH) <0.03 ng/mL    Comment: CRITICAL VALUE NOTED.  VALUE IS CONSISTENT WITH PREVIOUSLY REPORTED AND CALLED VALUE.  Troponin I     Status: Abnormal   Collection Time: 04/23/17  7:53 PM  Result Value Ref Range   Troponin I 0.05 (HH) <0.03 ng/mL    Comment: CRITICAL VALUE NOTED.  VALUE IS CONSISTENT  WITH PREVIOUSLY REPORTED AND CALLED VALUE.  Basic metabolic panel     Status: Abnormal   Collection Time: 04/24/17  4:15 AM  Result Value Ref Range   Sodium 141 135 - 145 mmol/L   Potassium 3.0 (L) 3.5 - 5.1 mmol/L   Chloride 124 (H) 101 - 111 mmol/L   CO2 11 (L) 22 - 32 mmol/L   Glucose, Bld 93 65 - 99 mg/dL   BUN 5 (L) 6 - 20 mg/dL   Creatinine, Ser 1.44 (H) 0.44 - 1.00 mg/dL   Calcium 6.8 (L) 8.9 - 10.3 mg/dL   GFR calc non Af Amer 36 (L) >60 mL/min   GFR calc Af Amer 42 (L) >60 mL/min    Comment: (NOTE) The eGFR has been calculated using the CKD EPI equation. This calculation has not been validated in all clinical situations. eGFR's persistently <60 mL/min signify possible Chronic Kidney Disease.    Anion gap 6 5 - 15  CBC     Status: Abnormal   Collection Time: 04/24/17  4:15 AM  Result Value Ref Range   WBC 21.7 (H) 4.0 - 10.5 K/uL   RBC 3.84 (L) 3.87 - 5.11 MIL/uL   Hemoglobin 9.9 (L) 12.0 - 15.0 g/dL   HCT 29.8 (L) 36.0 - 46.0 %   MCV 77.6 (L) 78.0 - 100.0 fL   MCH 25.8 (L) 26.0 - 34.0 pg   MCHC 33.2 30.0 - 36.0 g/dL   RDW 17.8 (H) 11.5 - 15.5 %   Platelets 265 150 - 400 K/uL  CBC     Status: Abnormal   Collection Time: 04/25/17  3:21 AM  Result Value Ref Range   WBC 24.2 (H) 4.0 - 10.5 K/uL   RBC 4.01 3.87 - 5.11 MIL/uL   Hemoglobin 10.3 (L) 12.0 - 15.0 g/dL   HCT 31.2 (L) 36.0 - 46.0 %   MCV 77.8 (L) 78.0 - 100.0 fL   MCH 25.7 (L) 26.0 - 34.0 pg   MCHC 33.0 30.0 - 36.0 g/dL  RDW 17.9 (H) 11.5 - 15.5 %   Platelets 270 150 - 400 K/uL  Basic metabolic panel     Status: Abnormal   Collection Time: 04/25/17  3:21 AM  Result Value Ref Range   Sodium 142 135 - 145 mmol/L   Potassium 2.9 (L) 3.5 - 5.1 mmol/L   Chloride 123 (H) 101 - 111 mmol/L   CO2 8 (L) 22 - 32 mmol/L   Glucose, Bld 106 (H) 65 - 99 mg/dL   BUN <5 (L) 6 - 20 mg/dL   Creatinine, Ser 1.59 (H) 0.44 - 1.00 mg/dL   Calcium 6.9 (L) 8.9 - 10.3 mg/dL   GFR calc non Af Amer 32 (L) >60 mL/min   GFR  calc Af Amer 37 (L) >60 mL/min    Comment: (NOTE) The eGFR has been calculated using the CKD EPI equation. This calculation has not been validated in all clinical situations. eGFR's persistently <60 mL/min signify possible Chronic Kidney Disease.    Anion gap 11 5 - 15   Dg Chest 2 View  Result Date: 04/23/2017 CLINICAL DATA:  Chest pain EXAM: CHEST  2 VIEW COMPARISON:  02/03/2017, 10/16/2016, 10/06/2016 FINDINGS: Large hiatal hernia containing dilated bowel, increased compared to 10/16/2016, similar compared with 09/18/2016. No pleural effusion or focal consolidation. Stable cardiomediastinal silhouette. No pneumothorax. Surgical clips in the right upper quadrant. IMPRESSION: No focal consolidation or effusion. Large hiatal hernia containing air filled dilated bowel. Electronically Signed   By: Donavan Foil M.D.   On: 04/23/2017 17:40   Ct Head Wo Contrast  Result Date: 04/25/2017 CLINICAL DATA:  Right facial numbness EXAM: CT HEAD WITHOUT CONTRAST TECHNIQUE: Contiguous axial images were obtained from the base of the skull through the vertex without intravenous contrast. COMPARISON:  Head CT 08/31/2011 FINDINGS: Brain: No mass lesion, intraparenchymal hemorrhage or extra-axial collection. No evidence of acute cortical infarct. 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 Brain parenchyma is otherwise normal for age. Vascular: No hyperdense vessel or unexpected calcification. Skull: Normal visualized skull base, calvarium and extracranial soft tissues. Sinuses/Orbits: No sinus fluid levels or advanced mucosal thickening. No mastoid effusion. Normal orbits. IMPRESSION: Unchanged left internal capsule lacunar infarct compared to 08/31/2011. No acute intracranial abnormality. Electronically Signed   By: Ulyses Jarred M.D.   On: 04/25/2017 03:43    Assessment: 71 y.o. female with a history of right lacunar infarction with transient numbness in equivocal weakness involving right side  of her face, possible manifestations of TIA. Recurrent small vessel right cerebral infarction cannot be ruled out at this point, however.  Stroke Risk Factors - none  Plan: 1. HgbA1c, fasting lipid panel 2. MRI, MRA  of the brain without contrast 3. Echocardiogram 4. Carotid dopplers 5. Prophylactic therapy-Antiplatelet med: Aspirin  6. Risk factor modification 7. Telemetry monitoring  C.R. Nicole Kindred, MD [facility] [phone]  04/25/2017, 5:28 AM

## 2017-04-25 NOTE — Progress Notes (Signed)
OT Cancellation Note  Patient Details Name: Danielle [name] MRN: [id number] DOB: 1946/08/09   Cancelled Treatment:    Reason Eval/Treat Not Completed: Fatigue/lethargy limiting ability to participate;Other (comment) (Pt reporting she did not get much sleep last night. ) Requests OT to come back later.     Tyrone Schimke OTR/L Pager: [phone]  04/25/2017, 11:37 AM

## 2017-04-25 NOTE — Progress Notes (Signed)
STROKE TEAM PROGRESS NOTE   HISTORY OF PRESENT ILLNESS (per record) Danielle [name] is an 71 y.o. female with a history of previous left lacunar infarction, psoriasis, Crohn's disease and hiatal hernia who was admitted on 04/21/2017 for incision and drainage of perirectal abscess. Neurology consultation was obtained following acute onset of numbness and tingling involving the right side of her face at 2:40 AM today. There was equivocal right facial droop as well. She had no sensory changes involving extremities. There was no change in speech. CT scan of her head without contrast was obtained which showed no acute intracranial abnormality. Symptoms resolved after 5 minutes and did not recur. She has not been on antiplatelet therapy. She also was not hypertensive and does not have known diabetes mellitus.  LSN: 2:40 AM on 04/25/2017 tPA Given: No: Symptoms rapidly resolved mRankin:   SUBJECTIVE (INTERVAL HISTORY) Her daughter is at the bedside.  Pt apparently has right facial swelling, on palpation, she had tenderness on touch at right cheek and right corner of lip. Cotton swap also showed tenderness at right buccal area, concerning for local infection.    OBJECTIVE Temp:  [98.6 F (37 C)-101.1 F (38.4 C)] 98.9 F (37.2 C) (05/20 0515) Pulse Rate:  [98-111] 98 (05/20 0515) Cardiac Rhythm: Normal sinus rhythm (05/20 0950) Resp:  [18-40] 18 (05/20 0515) BP: (90-111)/(53-85) 92/62 (05/20 0515) SpO2:  [90 %-98 %] 98 % (05/20 0515)  CBC:   Recent Labs Lab 04/24/17 0415 04/25/17 0321  WBC 21.7* 24.2*  HGB 9.9* 10.3*  HCT 29.8* 31.2*  MCV 77.6* 77.8*  PLT 265 818    Basic Metabolic Panel:   Recent Labs Lab 04/24/17 0415 04/25/17 0321  NA 141 142  K 3.0* 2.9*  CL 124* 123*  CO2 11* 8*  GLUCOSE 93 106*  BUN 5* <5*  CREATININE 1.44* 1.59*  CALCIUM 6.8* 6.9*    Lipid Panel:     Component Value Date/Time   CHOL 73 04/25/2017 0932   TRIG 43 04/25/2017 0932   HDL 40 (L)  04/25/2017 0932   CHOLHDL 1.8 04/25/2017 0932   VLDL 9 04/25/2017 0932   LDLCALC 24 04/25/2017 0932   HgbA1c:  Lab Results  Component Value Date   HGBA1C 5.6 01/26/2015   Urine Drug Screen:     Component Value Date/Time   LABOPIA NEGATIVE 02/12/2009 1034   COCAINSCRNUR NEGATIVE 02/12/2009 1034   LABBENZ NEGATIVE 02/12/2009 1034   AMPHETMU NEGATIVE 02/12/2009 1034    Alcohol Level No results found for: [place] I have personally reviewed the radiological images below and agree with the radiology interpretations.  Dg Chest 2 View 04/23/2017 No focal consolidation or effusion. Large hiatal hernia containing air filled dilated bowel.   Ct Head Wo Contrast 04/25/2017 Unchanged left internal capsule lacunar infarct compared to 08/31/2011. No acute intracranial abnormality.   Mr Danielle [name] Head Wo Contrast 04/25/2017 1. No acute intracranial abnormality. The study was discontinued early as the patient refused to complete the exam.  2. 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  3. Normal variant MRA circle of Willis without significant proximal stenosis, aneurysm, or branch vessel occlusion.   TTE 04/23/2017 Study Conclusions - Left ventricle: The cavity size was normal. Wall thickness was   normal. Systolic function was normal. The estimated ejection   fraction was in the range of 55% to 60%. Doppler parameters are   consistent with abnormal left ventricular relaxation (grade 1   diastolic dysfunction). Impressions: - Extremely  limited due to poor sound wave transmission; LV   function appears to be preserved; focal wall motion abnormality   cannot be excluded; mild diastolic dysfunction; right heart not   well visualized but appears to be normal in size with normal RV   function.  CUS pending   PHYSICAL EXAM  Temp:  [98.6 F (37 C)-101.1 F (38.4 C)] 100.2 F (37.9 C) (05/20 1603) Pulse Rate:  [98-111] 103 (05/20 1603) Resp:  [18-40] 22 (05/20  1603) BP: (90-111)/(50-85) 92/50 (05/20 1603) SpO2:  [90 %-98 %] 98 % (05/20 1603)  General - Well nourished, well developed, in no apparent distress.  Ophthalmologic - Fundi not visualized due to noncooperation.  Cardiovascular - Regular rate and rhythm.  Mental Status -  Level of arousal and orientation to time, place, and person were intact. Language including expression, naming, repetition, comprehension was assessed and found intact.  Cranial Nerves II - XII - II - Visual field intact OU. III, IV, VI - Extraocular movements intact. V - Facial sensation intact bilaterally. VII - right facial swelling at right cheek and right corner of lip, with tenderness to touch at right cheek and right buccal mucosa.  VIII - Hearing & vestibular intact bilaterally. X - Palate elevates symmetrically. XI - Chin turning & shoulder shrug intact bilaterally. XII - Tongue protrusion intact.  Motor Strength - The patient's strength was normal in all extremities and pronator drift was absent.  Bulk was normal and fasciculations were absent.   Motor Tone - Muscle tone was assessed at the neck and appendages and was normal.  Reflexes - The patient's reflexes were 1+ in all extremities and she had no pathological reflexes.  Sensory - Light touch, temperature/pinprick were assessed and were symmetrical.    Coordination - The patient had normal movements in the hands with no ataxia or dysmetria.  Tremor was absent.  Gait and Station - deferred   ASSESSMENT/PLAN [name] is a 71 y.o. female with history of hiatal hernia, Crohn's disease, status post drainage of a perirectal abscess on 04/21/2017, and a remote lacunar infarct by imaging presenting with transient numbness and tingling involving the right side of her face with an equivocal right facial droop. She did not receive IV t-PA due to resolution of deficits.  Right facial/cheek/buccal mucosa infection - not stroke   Resultant  Right  facial swelling and tenderness to touch  CT head - no acute intracranial abnormality.  MRI head - No acute intracranial abnormality. 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  MRA head - no significant stenosis noted.  Carotid Doppler - pending  2D Echo - EF 55-60%. No cardiac source of emboli identified.  Recommend ENT or dental consultation if needed  LDL - 24  HgbA1c - pending  VTE prophylaxis - Lovenox DIET SOFT Room service appropriate? Yes; Fluid consistency: Thin  No antithrombotic prior to admission, now on aspirin 81 mg daily. Continue ASA   Patient counseled to be compliant with her antithrombotic medications  Ongoing aggressive stroke risk factor management  Therapy recommendations: pending  Disposition: Pending   Other Stroke Risk Factors  Advanced age  Former cigarette smoker. Quit approximately 20 years ago.  Hx stroke/TIA - remote lacunar infarct of the anterior left internal capsule by MRI.  Other Active Problems  Perirectal abscess s/p I&D - on Abx  Anemia  Leukocytosis - 24.2 K - Tmax 101.1 - IV Rocephin and Flagyl were started 04/21/2017  Hypokalemia - 2.9 ->  supplemented  Hospital day # 4  Neurology will sign off. Please call with questions. No neuro follow up needed. Thanks for the consult.  Rosalin Hawking, MD PhD Stroke Neurology 04/25/2017 5:08 PM  To contact Stroke Continuity provider, please refer to [email or web address] After hours, contact General Neurology

## 2017-04-25 NOTE — Progress Notes (Signed)
OT Cancellation Note  Patient Details Name: Danielle [name] MRN: [id number] DOB: June 14, 1946   Cancelled Treatment:    Reason Eval/Treat Not Completed: Other (comment). Reattempted OT eval. Pt coughing and trying to clear her throat upon therapist arrival. "I am not up for therapy today."  Visitors present and assisting pt. They report she is having difficulty swallowing her liquid medication.Nursing notified.     Tyrone Schimke OTR/L Pager: [phone]  04/25/2017, 2:32 PM

## 2017-04-25 NOTE — Progress Notes (Signed)
Pharmacy Antibiotic Note  Danielle [name] is a 71 y.o. female with oropharangeal candidiasis .  Pharmacy has been consulted for fluconazole dosing. He is tolerating po medications -WBC= 24.2. Tmax= 101.1, SCr= 1.59 and CrCl ~ 30   Plan: -Fluconazole 200mg  IV now then 100mg  po daily -Will sign off. Please contact pharmacy with any other needs.  Height: 5\' 4"  (162.6 cm) Weight: 128 lb 11.2 oz (58.4 kg) IBW/kg (Calculated) : 54.7  Temp (24hrs), Avg:99.7 F (37.6 C), Min:98.6 F (37 C), Max:101.1 F (38.4 C)   Recent Labs Lab 04/21/17 1343 04/22/17 0424 04/22/17 1452 04/23/17 0642 04/24/17 0415 04/25/17 0321  WBC 44.9* 35.6*  --  29.3* 21.7* 24.2*  CREATININE 1.71* 1.63* 1.41* 1.45* 1.44* 1.59*    Estimated Creatinine Clearance: 28.8 mL/min (A) (by C-G formula based on SCr of 1.59 mg/dL (H)).    Allergies  Allergen Reactions  . Zithromax [Azithromycin] Swelling and Other (See Comments)    Patient states that she has a swollen mouth/ blisters, yeast present mouth and vaginia.  . Aspirin Other (See Comments)    Cramps her stomach  . Ibuprofen Other (See Comments)    Patient preference  . Sulfa Antibiotics Nausea And Vomiting     Thank you for allowing pharmacy to be a part of this patient's care.  Hildred Laser, Pharm D 04/25/2017 3:30 PM

## 2017-04-26 ENCOUNTER — Encounter (HOSPITAL_COMMUNITY): Payer: Medicare HMO

## 2017-04-26 ENCOUNTER — Inpatient Hospital Stay (HOSPITAL_COMMUNITY): Payer: Medicare HMO

## 2017-04-26 LAB — HEMOGLOBIN A1C
HEMOGLOBIN A1C: 5.4 % (ref 4.8–5.6)
Mean Plasma Glucose: 108 mg/dL

## 2017-04-26 MED ORDER — IOPAMIDOL (ISOVUE-300) INJECTION 61%
INTRAVENOUS | Status: AC
  Administered 2017-04-26: 75 mL
  Filled 2017-04-26: qty 75

## 2017-04-26 MED ORDER — SACCHAROMYCES BOULARDII 250 MG PO CAPS
250.0000 mg | ORAL_CAPSULE | Freq: Two times a day (BID) | ORAL | Status: DC
  Administered 2017-04-26 – 2017-04-29 (×7): 250 mg via ORAL
  Filled 2017-04-26 (×7): qty 1

## 2017-04-26 MED ORDER — METRONIDAZOLE 500 MG PO TABS
500.0000 mg | ORAL_TABLET | Freq: Three times a day (TID) | ORAL | Status: DC
  Administered 2017-04-26 – 2017-04-27 (×3): 500 mg via ORAL
  Filled 2017-04-26 (×3): qty 1

## 2017-04-26 NOTE — Progress Notes (Signed)
Physical Therapy Treatment Patient Details Name: Danielle [name] MRN: [id number] DOB: 31-Jul-1946 Today's Date: 04/26/2017    History of Present Illness pt is a 71 yo female s/p incision and drainage of perirectal abscess. Pt with acute onset of numbness and tingling involving the right side of her face on 5/20 while in house with symptoms resolving (no acute findings on MRI/CT); Pt has a history of GERD, Crohn's disease, previous left lacunar infarction.     PT Comments    Pt progressing towards all goals. Pt con't to be self limiting due to fatigue. Pt with SOB with ambulation. Pt functioning at min guard level. Acute PT to follow.   Follow Up Recommendations  Home health PT;Supervision/Assistance - 24 hour     Equipment Recommendations   (cont use of cane)    Recommendations for Other Services       Precautions / Restrictions Precautions Precautions: Fall Precaution Comments: perirectal abscess Restrictions Weight Bearing Restrictions: No    Mobility  Bed Mobility Overal bed mobility: Modified Independent Bed Mobility: Supine to Sit     Supine to sit: Modified independent (Device/Increase time);HOB elevated Sit to supine: Supervision   General bed mobility comments: increased time but no physical assist needed  Transfers Overall transfer level: Needs assistance Equipment used: 1 person hand held assist Transfers: Sit to/from Stand Sit to Stand: Min guard         General transfer comment: pt reaching for something to hold onto,increased time, cautious/guarded  Ambulation/Gait Ambulation/Gait assistance: Min guard Ambulation Distance (Feet): 100 Feet Assistive device: 1 person hand held assist Gait Pattern/deviations: Step-through pattern Gait velocity: dec Gait velocity interpretation: Below normal speed for age/gender General Gait Details: +SOB, pt reports this to be normal. pt guarded and cautious, reaching for hallway rail and required 1 standing rest  break. held onto PTs hand gingerly. asked pt to use her cane however pt states"i don't need that   Stairs            Wheelchair Mobility    Modified Rankin (Stroke Patients Only)       Balance Overall balance assessment: Needs assistance Sitting-balance support: No upper extremity supported;Feet supported Sitting balance-Leahy Scale: Fair     Standing balance support: Single extremity supported Standing balance-Leahy Scale: Poor Standing balance comment: pt with increased stability with single UE support                            Cognition Arousal/Alertness: Awake/alert Behavior During Therapy: WFL for tasks assessed/performed Overall Cognitive Status: Within Functional Limits for tasks assessed                                        Exercises      General Comments        Pertinent Vitals/Pain Pain Assessment: 0-10 Pain Score: 7  Faces Pain Scale: Hurts a little bit Pain Location: L buttock Pain Descriptors / Indicators: Aching Pain Intervention(s): Monitored during session    Home Living Family/patient expects to be discharged to:: Private residence Living Arrangements: Other relatives Available Help at Discharge: Family;Available PRN/intermittently Type of Home: House Home Access: Stairs to enter   Home Layout: Multi-level Home Equipment: Shower seat;Bedside commode      Prior Function Level of Independence: Independent with assistive device(s)          PT Goals (current  goals can now be found in the care plan section) Acute Rehab PT Goals Patient Stated Goal: go home Progress towards PT goals: Progressing toward goals    Frequency    Min 3X/week      PT Plan Current plan remains appropriate    Co-evaluation              AM-PAC PT "6 Clicks" Daily Activity  Outcome Measure  Difficulty turning over in bed (including adjusting bedclothes, sheets and blankets)?: A Little Difficulty moving from lying  on back to sitting on the side of the bed? : A Little Difficulty sitting down on and standing up from a chair with arms (e.g., wheelchair, bedside commode, etc,.)?: A Little Help needed moving to and from a bed to chair (including a wheelchair)?: A Little Help needed walking in hospital room?: A Little Help needed climbing 3-5 steps with a railing? : A Little 6 Click Score: 18    End of Session   Activity Tolerance: Patient limited by fatigue Patient left:  (on [facility], RN made aware, pt reports she will push call light) Nurse Communication: Mobility status PT Visit Diagnosis: Unsteadiness on feet (R26.81);Difficulty in walking, not elsewhere classified (R26.2)     Time: 0813-8871 PT Time Calculation (min) (ACUTE ONLY): 15 min  Charges:  $Gait Training: 8-22 mins                    G Codes:      Kittie Plater, PT, DPT Pager #: [phone] Office #: [phone]    [place] 04/26/2017, 4:09 PM

## 2017-04-26 NOTE — Progress Notes (Signed)
5 Days Post-Op    CC:  Perirectal pain  Subjective: She still has the facial swelling and some tenderness.  She is awaiting PICC so she can get a CT of her face to rule out infection in this area.  Her rectal wound looks good.  She notes some drainage from her Vagina that is yellow.  She was started on Diflucan yesterday.    Objective: Vital signs in last 24 hours: Temp:  [98.3 F (36.8 C)-100.2 F (37.9 C)] 98.9 F (37.2 C) (05/21 0551) Pulse Rate:  [92-103] 102 (05/21 0551) Resp:  [17-22] 18 (05/21 0551) BP: (92-99)/(50-57) 93/57 (05/21 0551) SpO2:  [98 %-100 %] 100 % (05/21 0551) Last BM Date: 04/24/17  Intake/Output from previous day: 05/20 0701 - 05/21 0700 In: 1904.2 [P.O.:120; I.V.:1234.2; IV Piggyback:550] Out: 200 [Urine:200] Intake/Output this shift: No intake/output data recorded.  General appearance: alert, cooperative and no distress. Facial exam:  Facial swelling and erythema on the right.  No carries, or swelling under parital noted upper gum line with partial out.  Some white exudate on tongue. No complaints from swallowing.    Resp: clear to auscultation bilaterally GI: soft, non-tender; bowel sounds normal; no masses,  no organomegaly Skin: Skin color, texture, turgor normal. No rashes or lesions or open site dress and clean, no cellulitis  Lab Results:   Recent Labs  04/24/17 0415 04/25/17 0321  WBC 21.7* 24.2*  HGB 9.9* 10.3*  HCT 29.8* 31.2*  PLT 265 270    BMET  Recent Labs  04/24/17 0415 04/25/17 0321  NA 141 142  K 3.0* 2.9*  CL 124* 123*  CO2 11* 8*  GLUCOSE 93 106*  BUN 5* <5*  CREATININE 1.44* 1.59*  CALCIUM 6.8* 6.9*   PT/INR No results for input(s): LABPROT, INR in the last 72 hours.  No results for input(s): AST, ALT, ALKPHOS, BILITOT, PROT, ALBUMIN in the last 168 hours.   Lipase     Component Value Date/Time   LIPASE 20 02/03/2017 0310     Prior to Admission medications   Medication Sig Start Date End Date Taking?  Authorizing Provider  acetaminophen (TYLENOL) 500 MG tablet Take 1,000 mg by mouth every 6 (six) hours as needed for mild pain or moderate pain.   Yes [provider]  Calcium Carbonate-Vitamin D (CALCIUM + D PO) Take 1 tablet by mouth daily.   Yes [provider]  Cyanocobalamin (VITAMIN B-12 IJ) Inject 1 each as directed every 30 (thirty) days. Around the 1st week of each month   Yes [provider]  dicyclomine (BENTYL) 10 MG/5ML syrup TAKE 10 MLS (20 MG TOTAL) BY MOUTH 3 (THREE) TIMES DAILY AS NEEDED. Patient taking differently: Take 20 mg by mouth 3 (three) times daily as needed (for stomach).  03/18/17  Yes Rehman, Mechele Dawley, MD  diphenoxylate-atropine (LOMOTIL) 2.5-0.025 MG tablet TAKE 1 TABLET BY MOUTH 4 TIMES A DAY AS NEEDED FOR DIARRHEA/LOOSE STOOL 03/31/17  Yes Rehman, Mechele Dawley, MD  inFLIXimab (REMICADE) 100 MG injection Inject 100 mg into the vein every 6 (six) weeks. Next is due 03-11-2017   Yes [provider]  megestrol (MEGACE) 40 MG/ML suspension Take 5 mLs (200 mg total) by mouth daily. 04/09/17  Yes Rehman, Mechele Dawley, MD  Multiple Vitamin (MULTIVITAMIN WITH MINERALS) TABS Take 1 tablet by mouth daily.   Yes [provider]  nystatin (NYSTATIN) powder USE TO AFFECTED AREA TWICE DAILY Patient taking differently: Apply 1 g topically 2 (two) times daily  as needed. USE TO AFFECTED FOR ITCHING 02/18/17  Yes Rehman, Mechele Dawley, MD  nystatin cream (MYCOSTATIN) Apply 1 application topically 2 (two) times daily as needed for dry skin. 02/18/17  Yes Rehman, Mechele Dawley, MD  pantoprazole (PROTONIX) 40 MG tablet TAKE 1 TABLET (40 MG TOTAL) BY MOUTH 2 (TWO) TIMES DAILY BEFORE A MEAL. 05/25/16  Yes Setzer, Terri L, NP  potassium chloride SA (K-DUR,KLOR-CON) 20 MEQ tablet Take 1 tablet (20 mEq total) by mouth daily. Take potassium on days you take furosemide. 04/14/17  Yes Rehman, Mechele Dawley, MD  promethazine (PHENERGAN) 25 MG tablet Take 1 tablet (25 mg total) by mouth 2  (two) times daily as needed. Patient taking differently: Take 25 mg by mouth 2 (two) times daily as needed for nausea or vomiting.  02/18/17  Yes Rehman, Mechele Dawley, MD  triamcinolone cream (KENALOG) 0.1 % APPLY TO AFFECTED AREA TWICE A DAY AS NEEDED 03/10/17  Yes Rehman, Mechele Dawley, MD  Vitamin D, Ergocalciferol, (DRISDOL) 50000 units CAPS capsule Take 1 capsule (50,000 Units total) by mouth every 7 (seven) days. Patient taking differently: Take 50,000 Units by mouth every 7 (seven) days. On Mondays 07/19/16  Yes Rehman, Mechele Dawley, MD  albuterol (PROVENTIL HFA;VENTOLIN HFA) 108 (90 Base) MCG/ACT inhaler Inhale 2 puffs into the lungs every 6 (six) hours as needed for wheezing. 12/09/15   Rama, Venetia Maxon, MD  furosemide (LASIX) 40 MG tablet Take 1 tablet (40 mg total) by mouth daily as needed for edema. 06/30/16   Rogene Houston, MD  HYDROcodone-acetaminophen (NORCO) 7.5-325 MG tablet Take 1 tablet by mouth every 4 (four) hours as needed for moderate pain. 10/16/16   Dorie Rank, MD  spironolactone (ALDACTONE) 50 MG tablet Take 1 tablet (50 mg total) by mouth daily. Patient not taking: Reported on 04/22/2017 12/09/15   Rama, Venetia Maxon, MD    Medications: . acetaminophen (TYLENOL) oral liquid 160 mg/5 mL  500 mg Oral Q6H  . aspirin EC  81 mg Oral Daily  . dicyclomine  20 mg Oral TID AC  . enoxaparin (LOVENOX) injection  30 mg Subcutaneous Daily  . feeding supplement  1 Container Oral TID BM  . fluconazole  100 mg Oral Daily  . multivitamin with minerals  1 tablet Oral Daily  . potassium chloride  20 mEq Oral BID   . cefTRIAXone (ROCEPHIN)  IV Stopped (04/25/17 1011)  . dextrose 5 % and 0.45 % NaCl with KCl 20 mEq/L 50 mL/hr at 04/26/17 0531  . metronidazole Stopped (04/26/17 0700)  No IV fluids/access Anti-infectives    Start     Dose/Rate Route Frequency Ordered Stop   04/26/17 1000  fluconazole (DIFLUCAN) tablet 100 mg     100 mg Oral Daily 04/25/17 1531     04/25/17 1630  fluconazole (DIFLUCAN)  IVPB 200 mg     200 mg 100 mL/hr over 60 Minutes Intravenous  Once 04/25/17 1531 04/25/17 1713   04/21/17 1245  cefTRIAXone (ROCEPHIN) 2 g in dextrose 5 % 50 mL IVPB    Comments:  Pharmacy may adjust dosing strength / duration / interval for maximal efficacy   2 g 100 mL/hr over 30 Minutes Intravenous Daily 04/21/17 1226     04/21/17 1230  metroNIDAZOLE (FLAGYL) IVPB 500 mg     500 mg 100 mL/hr over 60 Minutes Intravenous Every 6 hours 04/21/17 1226      Iona Beard, MD :  PCP  Assessment/Plan Perirectal abscess/Crohn's disease Immunosuppression on Remicade I&D perirectal  abscess 04/21/17, Dr. 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  I will switch her over to PO flagyl, get a Vaginal swab, We are waiting on the IV access so we can get a CT with contrast to look at the facial swelling and find a source.  I will also check a urine to be sure we don't have issue with rectovaginal fistula again. Ask Case Management to see and begin discharge planning.  I think that if the perirectal wound were the only issue she could go home today.  No cultures currently in system for this Admit.  LOS: 5 days    Kamorie Aldous 04/26/2017 [phone]

## 2017-04-26 NOTE — Consult Note (Signed)
           [facility] CM Primary Care Navigator  0/32/1224  Danielle [name] 82/50[birth year]0 [id number]   Martin Majestic to see patient at the bedside to identify possible discharge needs but RNs and NT are presently working with patient and a transport staff is awaiting at the doorway to bring patient down for CT scan as stated.  Will attempt to meet with patient at another time when she is available in the room.  For questions, please contact:  [name], BSN, RN- [facility] Primary Care Navigator  Telephone: [phone] [place]

## 2017-04-26 NOTE — Evaluation (Signed)
Occupational Therapy Evaluation Patient Details Name: Danielle [name] MRN: [id number] DOB: [birth year] Today's Date: 04/26/2017    History of Present Illness pt is a 71 yo female s/p incision and drainage of perirectal abscess. Pt with acute onset of numbness and tingling involving the right side of her face on 5/20 while in house with symptoms resolving (no acute findings on MRI/CT); Pt has a history of GERD, Crohn's disease, previous left lacunar infarction.    Clinical Impression   This 71 y/o F presents with the above. Pt reports at baseline she is independent with ADLs and ModI with functional mobility. Pt lives with family who are available to assist intermittently. Pt currently requires MinGuard Assist for ADLs and functional mobility, limited mostly by decreased activity tolerance this session. Pt will benefit from continued OT services to increase endurance, safety, and independence with ADLs and functional mobility.     Follow Up Recommendations  Home health OT;Supervision/Assistance - 24 hour    Equipment Recommendations  None recommended by OT           Precautions / Restrictions Precautions Precautions: Fall (contact ) Restrictions Weight Bearing Restrictions: No      Mobility Bed Mobility Overal bed mobility: Needs Assistance Bed Mobility: Sit to Supine       Sit to supine: Supervision   General bed mobility comments: for line management; increased time and use of hand rails  Transfers Overall transfer level: Needs assistance Equipment used: None Transfers: Sit to/from Stand Sit to Stand: Min guard         General transfer comment: close guard for safety during functional mobility     Balance Overall balance assessment: Needs assistance Sitting-balance support: No upper extremity supported;Feet supported Sitting balance-Leahy Scale: Fair     Standing balance support: No upper extremity supported;During functional activity Standing balance-Leahy  Scale: Fair                             ADL either performed or assessed with clinical judgement   ADL Overall ADL's : Needs assistance/impaired Eating/Feeding: Independent;Sitting   Grooming: Wash/dry face;Oral care;Min guard;Standing   Upper Body Bathing: Min guard;Sitting   Lower Body Bathing: Min guard;Sit to/from stand   Upper Body Dressing : Set up;Sitting   Lower Body Dressing: Min guard;Sit to/from stand Lower Body Dressing Details (indicate cue type and reason): donning briefs  Toilet Transfer: Min guard;Stand-pivot;BSC   Toileting- Clothing Manipulation and Hygiene: Min guard;Sit to/from stand       Functional mobility during ADLs: Min guard General ADL Comments: Pt completing toileting on BSC at start of session; completed LB dressing, brief room level functional mobility, standing grooming ADLs during session; monitored HR during session with HR between 125-131 during standing grooming ADLs                         Pertinent Vitals/Pain Pain Assessment: Faces Faces Pain Scale: Hurts a little bit Pain Location: left buttock/incision area Pain Descriptors / Indicators: Aching;Sore Pain Intervention(s): Monitored during session     Hand Dominance Right   Extremity/Trunk Assessment Upper Extremity Assessment Upper Extremity Assessment: Generalized weakness (shakiness noted in bil UEs )           Communication Communication Communication: No difficulties   Cognition Arousal/Alertness: Awake/alert Behavior During Therapy: WFL for tasks assessed/performed Overall Cognitive Status: Within Functional Limits for tasks assessed  General Comments                  Home Living Family/patient expects to be discharged to:: Private residence Living Arrangements: Other relatives Available Help at Discharge:  (Pt reports her niece is an Therapist, sports ) Type of Home: House Home Access: Stairs to  enter Technical brewer of Steps: 1   Home Layout: Multi-level Alternate Level Stairs-Number of Steps: 5   Bathroom Shower/Tub: Teacher, early years/pre: Standard     Home Equipment: Shower seat;Bedside commode          Prior Functioning/Environment Level of Independence: Independent with assistive device(s)                 OT Problem List: Decreased strength;Decreased activity tolerance      OT Treatment/Interventions: Self-care/ADL training;Therapeutic activities;Therapeutic exercise;Energy conservation;Patient/family education    OT Goals(Current goals can be found in the care plan section) Acute Rehab OT Goals Patient Stated Goal: to go home OT Goal Formulation: With patient Time For Goal Achievement: 05/10/17 Potential to Achieve Goals: Good ADL Goals Pt Will Perform Grooming: with supervision;standing Pt Will Perform Lower Body Dressing: with supervision;sit to/from stand Pt Will Transfer to Toilet: with supervision;ambulating;regular height toilet Pt Will Perform Toileting - Clothing Manipulation and hygiene: with supervision;sit to/from stand Pt/caregiver will Perform Home Exercise Program: Increased strength;Both right and left upper extremity;Independently  OT Frequency: Min 2X/week                             AM-PAC PT "6 Clicks" Daily Activity     Outcome Measure Help from another person eating meals?: None Help from another person taking care of personal grooming?: A Little Help from another person toileting, which includes using toliet, bedpan, or urinal?: A Little Help from another person bathing (including washing, rinsing, drying)?: A Little Help from another person to put on and taking off regular upper body clothing?: A Little Help from another person to put on and taking off regular lower body clothing?: A Little 6 Click Score: 19   End of Session Nurse Communication: Mobility status (nurse tech)  Activity  Tolerance: Patient tolerated treatment well Patient left: in bed;with call bell/phone within reach;with nursing/sitter in room (case manager present)  OT Visit Diagnosis: Muscle weakness (generalized) (M62.81)                Time: 1694-5038 OT Time Calculation (min): 26 min Charges:  OT General Charges $OT Visit: 1 Procedure OT Evaluation $OT Eval Low Complexity: 1 Procedure G-Codes:     Lou Cal, OT Pager [phone] 04/26/2017   Raymondo Band 04/26/2017, 2:21 PM

## 2017-04-26 NOTE — Progress Notes (Signed)
PT Cancellation Note  Patient Details Name: Danielle [name] MRN: [id number] DOB: February 28, 1946   Cancelled Treatment:    Reason Eval/Treat Not Completed: Other (comment). Pt refused PT stating, "I can't walk right now. You can come back after my family leaves around 2pm. But I am not doing anything right now."  Pt educated on importance of OOB mobility pt con't to refused. PT to return as able.   Kacen Mellinger M Melvie Paglia 04/26/2017, 12:34 PM  Kittie Plater, PT, DPT Pager #: [phone] Office #: [phone]

## 2017-04-26 NOTE — Care Management Note (Addendum)
Case Management Note  Patient Details  Name: KYMIA SIMI MRN: [id number] Date of Birth: 03/14/1946  Subjective/Objective:      Chest pain,  Perirectal abscess I&D 04/21/2017, Crohn's Disease            Action/Plan: Discharge Planning: NCM spoke to pt and offered choice for HH/list provided. Pt states she lives with sister and niece, Elpidio Galea. She has RW with seat and bedside commode at home. States she was recently approved for scooter. Pt requested [facility] for [facility] RN. Contacted [facility] Liaison with new referral.   PCP HILL, GERALD MD   Expected Discharge Date:                 Expected Discharge Plan:  [place]  In-House Referral:  NA  Discharge planning Services  CM Consult  Post Acute Care Choice:  Home Health Choice offered to:  Patient  DME Arranged:  N/A DME Agency:  NA  HH Arranged:  [place] Agency:  [place]  Status of Service:  Completed, signed off  If discussed at [place] of Stay Meetings, dates discussed:  04/29/2017  Additional Comments:  Erenest Rasher, RN 04/26/2017, 11:49 AM

## 2017-04-26 NOTE — Progress Notes (Signed)
Pt pulse has been reading in the 100's with 130 the highest reading so far.  02 reading, was at 75%, this reading  may be due to her nail polish. Applied the sticker on the fore head to get the readings and it went up to 100%. Pt also complain of SOB. Called [place] MD to inform about pt condition, was unable to reach MD, but spoke with Ernst Breach in the office and she promised to relate my message to MD. Advice pt to drink more fluids. Will continue to monitor

## 2017-04-26 NOTE — Care Management Important Message (Signed)
Important Message  Patient Details  Name: Danielle [name] MRN: [id number] Date of Birth: [birth year]   Medicare Important Message Given:  Yes    Erenest Rasher, RN 04/26/2017, 11:50 AM

## 2017-04-27 ENCOUNTER — Inpatient Hospital Stay (HOSPITAL_COMMUNITY): Payer: Medicare HMO

## 2017-04-27 DIAGNOSIS — R079 Chest pain, unspecified: Secondary | ICD-10-CM

## 2017-04-27 DIAGNOSIS — E876 Hypokalemia: Secondary | ICD-10-CM

## 2017-04-27 DIAGNOSIS — D899 Disorder involving the immune mechanism, unspecified: Secondary | ICD-10-CM

## 2017-04-27 DIAGNOSIS — R198 Other specified symptoms and signs involving the digestive system and abdomen: Secondary | ICD-10-CM | POA: Diagnosis present

## 2017-04-27 DIAGNOSIS — Z9689 Presence of other specified functional implants: Secondary | ICD-10-CM

## 2017-04-27 DIAGNOSIS — K1379 Other lesions of oral mucosa: Secondary | ICD-10-CM | POA: Diagnosis present

## 2017-04-27 DIAGNOSIS — R000 Tachycardia, unspecified: Secondary | ICD-10-CM | POA: Diagnosis present

## 2017-04-27 DIAGNOSIS — R220 Localized swelling, mass and lump, head: Secondary | ICD-10-CM

## 2017-04-27 DIAGNOSIS — K50913 Crohn's disease, unspecified, with fistula: Secondary | ICD-10-CM

## 2017-04-27 DIAGNOSIS — G459 Transient cerebral ischemic attack, unspecified: Secondary | ICD-10-CM

## 2017-04-27 DIAGNOSIS — L989 Disorder of the skin and subcutaneous tissue, unspecified: Secondary | ICD-10-CM

## 2017-04-27 DIAGNOSIS — R200 Anesthesia of skin: Secondary | ICD-10-CM

## 2017-04-27 LAB — CBC
HEMATOCRIT: 31.8 % — AB (ref 36.0–46.0)
HEMOGLOBIN: 10.5 g/dL — AB (ref 12.0–15.0)
MCH: 25.8 pg — ABNORMAL LOW (ref 26.0–34.0)
MCHC: 33 g/dL (ref 30.0–36.0)
MCV: 78.1 fL (ref 78.0–100.0)
Platelets: 268 10*3/uL (ref 150–400)
RBC: 4.07 MIL/uL (ref 3.87–5.11)
RDW: 18.4 % — ABNORMAL HIGH (ref 11.5–15.5)
WBC: 24.3 10*3/uL — AB (ref 4.0–10.5)

## 2017-04-27 LAB — BASIC METABOLIC PANEL
ANION GAP: 11 (ref 5–15)
BUN: 10 mg/dL (ref 6–20)
CO2: 7 mmol/L — ABNORMAL LOW (ref 22–32)
Calcium: 7.1 mg/dL — ABNORMAL LOW (ref 8.9–10.3)
Chloride: 121 mmol/L — ABNORMAL HIGH (ref 101–111)
Creatinine, Ser: 1.61 mg/dL — ABNORMAL HIGH (ref 0.44–1.00)
GFR calc Af Amer: 37 mL/min — ABNORMAL LOW (ref >60)
GFR, EST NON AFRICAN AMERICAN: 32 mL/min — AB (ref >60)
Glucose, Bld: 95 mg/dL (ref 65–99)
Potassium: 3.1 mmol/L — ABNORMAL LOW (ref 3.5–5.1)
SODIUM: 139 mmol/L (ref 135–145)

## 2017-04-27 LAB — MAGNESIUM: Magnesium: 1.2 mg/dL — ABNORMAL LOW (ref 1.7–2.4)

## 2017-04-27 MED ORDER — FLUCONAZOLE 100 MG PO TABS
150.0000 mg | ORAL_TABLET | Freq: Every day | ORAL | Status: DC
  Administered 2017-04-27 – 2017-04-29 (×3): 150 mg via ORAL
  Filled 2017-04-27 (×3): qty 2

## 2017-04-27 MED ORDER — GERHARDT'S BUTT CREAM
TOPICAL_CREAM | Freq: Two times a day (BID) | CUTANEOUS | Status: DC
  Administered 2017-04-27 – 2017-04-29 (×5): via TOPICAL
  Filled 2017-04-27: qty 1

## 2017-04-27 MED ORDER — POTASSIUM CHLORIDE IN NACL 20-0.45 MEQ/L-% IV SOLN
INTRAVENOUS | Status: AC
  Administered 2017-04-27 – 2017-04-28 (×2): via INTRAVENOUS
  Filled 2017-04-27 (×2): qty 1000

## 2017-04-27 MED ORDER — POTASSIUM CHLORIDE CRYS ER 20 MEQ PO TBCR
40.0000 meq | EXTENDED_RELEASE_TABLET | Freq: Once | ORAL | Status: AC
  Administered 2017-04-27: 40 meq via ORAL
  Filled 2017-04-27: qty 2

## 2017-04-27 MED ORDER — MAGNESIUM SULFATE 2 GM/50ML IV SOLN
2.0000 g | Freq: Once | INTRAVENOUS | Status: AC
  Administered 2017-04-27: 2 g via INTRAVENOUS
  Filled 2017-04-27: qty 50

## 2017-04-27 MED ORDER — MAGIC MOUTHWASH W/LIDOCAINE
15.0000 mL | Freq: Three times a day (TID) | ORAL | Status: AC
  Administered 2017-04-27 – 2017-04-28 (×5): 15 mL via ORAL
  Filled 2017-04-27 (×6): qty 15

## 2017-04-27 MED ORDER — MEGESTROL ACETATE 400 MG/10ML PO SUSP
200.0000 mg | Freq: Every day | ORAL | Status: DC
  Administered 2017-04-28 – 2017-04-29 (×2): 200 mg via ORAL
  Filled 2017-04-27 (×2): qty 5

## 2017-04-27 MED ORDER — METRONIDAZOLE 500 MG PO TABS
500.0000 mg | ORAL_TABLET | Freq: Three times a day (TID) | ORAL | Status: DC
  Administered 2017-04-27: 500 mg via ORAL
  Filled 2017-04-27: qty 1

## 2017-04-27 NOTE — Consult Note (Signed)
[facility] Primary Care Navigator  6/44/0347  Danielle [name] [id number] [id number]   Met with patient at the bedside to identify possible discharge needs. Patient reports having a "knot to side of rectum" with severe pain that she could hardly sit or stand which had led to this admission. Patient endorses Dr. Iona [name] with [facility] as herprimary care provider.   [facility]ared [facility] [employer]. [place] obtain medications without any problem.   She states managing her medications at home using "pill box" system weekly.  Patient's niece Danielle [name]) provides transportation to herdoctors'appointments.  [employer] transportation benefits discussed with patient as well.  Patientlives with both her sister Danielle [name]) and niece who serve as her primary care providers at home as stated.  Anticipated discharge plan is home with home health services per patient.  Patient voiced understanding to call primary care provider's office when she returns back home, for a post discharge follow-up appointment within a week or sooner if needs arise.Patient letter (with PCP's contact number) was provided as herreminder. She mentioned having a prior scheduled appointment to see primary care provider on Wednesday 5/30. Patient was encouraged to keep the appointment to see PCP for  follow-up.   Patient denies any health management needs or concerns at this time. [facility] care management contact information provided for future needs that she may have.   For questions, please contact:  Dannielle [name], BSN, RN- [facility] Primary Care Navigator  Telephone: [phone] [place]

## 2017-04-27 NOTE — Progress Notes (Signed)
*  PRELIMINARY RESULTS* Vascular Ultrasound Carotid Duplex (Doppler) has been completed.  Preliminary findings: Bilateral: No significant (1-39%) ICA stenosis. Antegrade vertebral flow.    Landry Mellow, RDMS, RVT  04/27/2017, 12:50 PM

## 2017-04-27 NOTE — Discharge Summary (Signed)
Physician Discharge Summary  Patient ID: Danielle [name] MRN: [id number] DOB/AGE: [birth year] 71 y.o. 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  Vaginal candidiasis   I&D perirectal abscess 04/21/17, Danielle [name] Right facial swelling - resolved  Atypical chest pain - resolved  Chronic hypokalemia - K+ -3.1 this AM - continuing KCL replacement Hypomagnesemia - resolved Tachycardia - stable  Diarrhea - C diff negative 04/28/17 Sepsis with aspiration pneumonia - 12/2015 Chronic kidney disease Prior CVA  Schatzki ring    Principal Problem:   Tachycardia Active Problems:   Leukocytosis   Hypomagnesemia   Crohn's disease with fistula (HCC)   CKD (chronic kidney disease) stage 3, GFR 30-59 ml/min   Immunosuppressed status (HCC)   Perirectal abscess   Left buttock abscess   Perineal irritation in female   Mucosal irritation of oral cavity   Hypokalemia   Chest pain   Facial swelling   Right facial numbness   PROCEDURES: I&D perirectal abscess 04/21/17, Danielle [name] Course:  The patient is a 71 year old female who presented to our [place] office with new left perirectal pain. 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 She is on her Remicade therapy. Her seton was removed in October 2016. She has had episodes rectal pain that have resolved with abx.She was seen in early April 2017 with  similar symptoms. We placed her on Cipro and Flagyl and this resolved relatively quickly. She was seen by Dr Danielle [name] recently and had the area on left buttock that becomes enlarged from time to time and painful. It then spontaneously reduces. She denies any weight loss or worsening of her Crohn's symptoms recently. this area over last few days has become increasingly tender with purulence coming from it. she is unable to really sit on this. she has no fevers present. Recent upper and lower endoscopies 03/2017 significant for Schatzki ring in esophagus, stomach and duodenum normal, there was evidence of a prior end-to-end ileo-colonic anastomosis at the hepatic flexure which was patent with mild edema, friable mucosa, and mild stenosis. The anastomosis was traversed. The exam was otherwise normal throughout the examined colon. She is a former smoker who denies use of blood thinning medications.  She was seen in the ED and admitted by Danielle [name] and taken to the OR later that day for I&D of her perirectal abscess.  She did well from the abscess and had no real issues with that.  She had multiple other issues including hypokalemia.  She complained of chest pain the 2nd post op day, without shortness of breath.  She remained hypokalemic and KCl supplementation continued. Troponin was minimally elevated and she was seen by Cardiology. Her low level troponin was considered demand related and not ischemia, and cardiology signed off. On 5/20 a Rapid response was initiated for right facial numbness.  Neurology work up was again negative.  She had some facial swelling and no cause was found.  We had to place a PICC line in order to  get maxillofacial CT, which was also negative.  Swelling noted resolved on it's own.  She complained of vaginal discharge and we ordered a urine that has not been done after 48 hours. The vaginal drainage has pretty much resolved with diflucan, she has some cellulitis and peroneal  redness treated with Diflucan, and barrier cream.  She has had OT and PT recommend home health assistance.   Repeat labs show ongoing hypokalemia,,  hypomagnesemia, and elevated WBC.  Medicine has seen her in consult.  She had some tachycardia but nothing of significance was found in work up of various services.  We are continuing to replace K+ and Magnesium.  Her WBC is improving.  She has had some erythema, and yellow drainage from her Vagina since Monday.  We cultured it and got yeast, no bacteria, we still do not have a urine analysis.  We have kept her on Diflucan.  She had allot of skin irritation around her vagina and perineum which we treated with barrier cream/nystatin mixture.  She reported it was improving over the last 48 hours till this AM, when drainage changed.   She had multiple bouts of diarrhea reported on 5/23, C diff PCR was negative.  On 5/24, drainage from her vagina that had been a yeast turned green in color and looks like stool.  She has what appears to be a reoccurrence of her vaginal fistula.    Reviewed with Dr. Rosendo [name], and we plan to keep her on Diflucan a 5 more days, continue local care to skin, and let her follow up as an outpatient with Danielle [name], Danielle [name], and Danielle [name].  Danielle [name] wants 5 days of diflucan, KCL 20 meq TID, and Mag Oxide 400 mg BID. She is fairly anxious this AM and was complaining of pain, hyperventilating some in the room.  Danielle [name] is checking a chest xray to be sure she does not have fluid overload issues. CXR was fine.  After discussion she is fine, she just voided and it was clear. She understands the situation and is ready to go home.  We went over how to care for her skin in some detail.  She will go home the current Medication and has barrier cream to use after that.  Perirectal abscess site looks very good.  Home health to help with dressing changes, Home OT and PT ordered.      CBC Latest Ref Rng & Units 04/29/2017 04/28/2017 04/27/2017  WBC 4.0  - 10.5 K/uL 14.0(H) 16.3(H) 24.3(H)  Hemoglobin 12.0 - 15.0 g/dL 11.1(L) 9.8(L) 10.5(L)  Hematocrit 36.0 - 46.0 % 34.0(L) 30.1(L) 31.8(L)  Platelets 150 - 400 K/uL 344 295 268   CMP Latest Ref Rng & Units 04/29/2017 04/28/2017 04/27/2017  Glucose 65 - 99 mg/dL 83 79 95  BUN 6 - 20 mg/dL 9 10 10   Creatinine 0.44 - 1.00 mg/dL 1.46(H) 1.42(H) 1.61(H)  Sodium 135 - 145 mmol/L 140 140 139  Potassium 3.5 - 5.1 mmol/L 3.1(L) 3.4(L) 3.1(L)  Chloride 101 - 111 mmol/L 123(H) 122(H) 121(H)  CO2 22 - 32 mmol/L 8(L) 7(L) 7(L)  Calcium 8.9 - 10.3 mg/dL 8.0(L) 7.6(L) 7.1(L)  Total Protein 6.5 - 8.1 g/dL 7.9 - -  Total Bilirubin 0.3 - 1.2 mg/dL 0.6 - -  Alkaline Phos 38 - 126 U/L 88 - -  AST 15 - 41 U/L 17 - -  ALT 14 - 54 U/L 11(L) - -   Magnesium  04/29/17 1.7 - 2.4 mg/dL 2.0  Vitamin B-12 180 - 914 pg/mL 1,232     C difficile quick scan w PCR reflex  Status:  Final result   Ref Range & Units 04/28/17 0943  C Diff antigen NEGATIVE NEGATIVE   C Diff toxin NEGATIVE NEGATIVE   C Diff interpretation  No C. difficile detected.   Resulting Agency  [facility]      Specimen Collected: 04/28/17 0943 Last Resulted: 04/28/17 2043          Disposition: 01-Home or Self Care   Allergies as of 04/29/2017      Reactions   Zithromax [azithromycin] Swelling, Other (See Comments)   Patient states that she has a swollen mouth/ blisters, yeast present mouth and vaginia.   Aspirin Other (See Comments)   Cramps her stomach   Ibuprofen Other (See Comments)   Patient preference   Sulfa Antibiotics Nausea And Vomiting      Medication List    STOP taking these medications   HYDROcodone-acetaminophen 7.5-325 MG tablet Commonly known as:  NORCO   nystatin cream Commonly known as:  MYCOSTATIN   nystatin powder Commonly known as:  nystatin   promethazine 25 MG tablet Commonly known as:  PHENERGAN   spironolactone 50 MG tablet Commonly known as:  ALDACTONE   triamcinolone cream 0.1 % Commonly  known as:  KENALOG     TAKE these medications   acetaminophen 500 MG tablet Commonly known as:  TYLENOL Take 1,000 mg by mouth every 6 (six) hours as needed for mild pain or moderate pain.   albuterol 108 (90 Base) MCG/ACT inhaler Commonly known as:  PROVENTIL HFA;VENTOLIN HFA Inhale 2 puffs into the lungs every 6 (six) hours as needed for wheezing.   CALCIUM + D PO Take 1 tablet by mouth daily.   dicyclomine 10 MG/5ML syrup Commonly known as:  BENTYL TAKE 10 MLS (20 MG TOTAL) BY MOUTH 3 (THREE) TIMES DAILY AS NEEDED. What changed:  reasons to take this   diphenoxylate-atropine 2.5-0.025 MG tablet Commonly known as:  LOMOTIL TAKE 1 TABLET BY MOUTH 4 TIMES A DAY AS NEEDED FOR DIARRHEA/LOOSE STOOL   fluconazole 150 MG tablet Commonly known as:  DIFLUCAN Take 1 tablet (150 mg total) by mouth daily. Start taking on:  04/30/2017   furosemide 40 MG tablet Commonly known as:  LASIX Take 1 tablet (40 mg total) by mouth daily as needed for edema.   Gerhardt's butt cream Crea You can use this till it is completed and then use a regular barrier cream after that.  Apply to the skin around the vagina and perineum.  The aim is to keep the skin as dry and protected from the moisture as possible.   inFLIXimab 100 MG injection Commonly known as:  REMICADE Do not take this till you talk with Danielle [name].  Call and have their office see you as soon as possible. What changed:  how much to take  how to take this  when to take this  additional instructions   magnesium oxide 400 (241.3 Mg) MG tablet Commonly known as:  MAG-OX Take 1 tablet (400 mg total) by mouth 2 (two) times daily.   megestrol 40 MG/ML suspension Commonly known as:  MEGACE Stop taking this for now.  Wait till you see your primary care doctor, Danielle [name] before resuming. What changed:  how much to take  how to take this  when to take this  additional instructions   multivitamin with minerals Tabs tablet Take  1 tablet by  mouth daily.   pantoprazole 40 MG tablet Commonly known as:  PROTONIX TAKE 1 TABLET (40 MG TOTAL) BY MOUTH 2 (TWO) TIMES DAILY BEFORE A MEAL.   potassium chloride SA 20 MEQ tablet Commonly known as:  K-DUR,KLOR-CON Take 1 tablet three times a day.  Have your primary care see you and recheck your potassium on Monday if possible.  Let him prescribe the potassium after he see's you next week. What changed:  how much to take  how to take this  when to take this  additional instructions   saccharomyces boulardii 250 MG capsule Commonly known as:  FLORASTOR You can buy this at any drug store and take daily till you see Danielle [name].  Follow package instructions on dosage.   traMADol 50 MG tablet Commonly known as:  ULTRAM Take 1 tablet (50 mg total) by mouth every 6 (six) hours as needed for moderate pain.   VITAMIN B-12 IJ Inject 1 each as directed every 30 (thirty) days. Around the 1st week of each month   Vitamin D (Ergocalciferol) 50000 units Caps capsule Commonly known as:  DRISDOL Take 1 capsule (50,000 Units total) by mouth every 7 (seven) days. What changed:  additional instructions      Follow-up Information    [facility] Follow up.   Why:  Home Health RN  Contact information: [place] [address] [phone]        Danielle Beard, MD Follow up.   Specialty:  Family Medicine Why:  Call and follow up with him for all your medical issues.  See if you can get in and see them next week to get your potassium checked. Contact information: [address] [place] [address] [phone]        Rogene Houston, MD Follow up.   Specialty:  Gastroenterology Why:  Call and let them know what has occured and decide about next dose of Remicade. Try to see in the next week if possible. Contact information: [place], [address] [place] [address] [phone]        Leighton Ruff, MD Follow up on  05/11/2017.   Specialty:  General Surgery Why:  Your appointment is at 10:10 AM, be at the office 30 minutes early for check in.  Bring photo ID and insurance information.  CAll if you have new issues, or symptoms become worse before your appointment.   Contact information: [place] [address] [place] [address] [phone]           Signed: Earnstine Regal 04/29/2017, 3:34 PM

## 2017-04-27 NOTE — Consult Note (Signed)
[facility] Medical Consultation  Danielle [name] WER:[id number] DOB: Aug 12, 1946 DOA: 04/21/2017 PCP: Danielle Beard, MD   Requesting physician: Danielle [name] Date of consultation: 04/27/17 Reason for consultation: tachycardia, perineal irritiation  Impression/Recommendations Principal Problem:   Tachycardia Active Problems:   Leukocytosis   Hypomagnesemia   Crohn's disease with fistula (HCC)   CKD (chronic kidney disease) stage 3, GFR 30-59 ml/min   Immunosuppressed status (Danielle [name])   Perirectal abscess   Left buttock abscess   Perineal irritation in female   Mucosal irritation of oral cavity   Hypokalemia  #1. Tachycardia. Likely related to decreased oral intake as well as hypokalemia. She was evaluated over the weekend by cardiology for atypical chest pain who opined pain was pleural. Tachycardia improved with fluids. Current EKG no acute changes. Currently chest pain-free. -IV fluids -Hold Lasix for now -Replete potassium -Obtain magnesium -Continue to monitor  #2. Chronic kidney disease stage III. Current creatinine 1.6. This appears to be close to her baseline. -Monitor urine output -Gentle IV fluids -Hold nephrotoxins -Recheck in the morning  #3. Hypokalemia. Related to decreased oral intake. Potassium level 3.1 -Replete -Hold Lasix -Check magnesium level -Recheck in the morning  4. Mucosal irritation of the oral cavity. Etiology uncertain. Improving today. Concern for thrush. Pain with swallowing. Airway patent. Improving after 2 doses of Diflucan -continue Diflucan -swish and spit -monitor  #5.leukocytosis. Chronic in chron's patient. Not on steroids. Afebrile, non-toxic appearing. WBC Trending down. Currently close to what appears to be baseline. Surgical site clean and dry. Has been on rocephin.  -urinalysis -follow urine culture  #6. Perineal irritation. Etiology uncertain. Patient denies any vaginal discharge. Cultures sent yesterday. No burning or pain  except with urination. -follow culture -OP follow up  #7. Perirectal abscess status post I and D by surgery. -Defer management to Gen. Surgery  #8. Crohn's disease with history of fistula. Appears stable at baseline. Home medication includes Remicade and bentlyl -Continue home meds    Thank you for this consultation.  Chief Complaint: tachycardia, perineal irritation  HPI: [name] 71 year old female past medical history chronic GERD, large hiatal hernia, Crohn's, rectovaginal fistula 2016, chronic leukocytosis, chronic kidney disease stage III, MRSA care, chronic immunosuppression was admitted 7 days ago for incision and drainage of perirectal abscess. [facility] asked to consult for medical issues.  Information is obtained from the patient and the chart. 2 days ago she developed acute onset of numbness and tingling involving the right side of her face. Some question of a facial droop as well. CT was obtained and was negative. she was evaluated by neurology who recommended stroke workup in patient with history of right lacunar infarct is concern for TIA versus he current infarct. Workup was negative. In addition she experienced some atypical chest pain. She was evaluated by cardiology who opined chest pain pleuritic in nature and noted low suspicion for ACS. Opponents were trended and negative chest x-ray without acute abnormalities. EKG without acute changes.   Last night she developed tachycardia without chest pain and no EKG changes, chest complained facial swelling with red tongue and painful swallowing. She also complained of perineal irritation which Diflucan was ordered. Since Diflucan started facial swelling and oral mucosa erythema improving. She continues to complain of decreased appetite. She denies abdominal pain nausea vomiting. She denies headache dizziness syncope or near-syncope. She denies chest pain palpitation shortness of breath. She denies dysuria hematuria  frequency or urgency. She denies any itching or burning in her perineal area except with  urination. She reports chronic loose stool which has not changed. She reports ambulating postoperatively with a steady gait.   Review of Systems:  10 point review of systems complete and all systems are negative except as indicated in the history of present illness  Past Medical History:  Diagnosis Date  . Crohn disease ([place])   . Hiatal hernia 12/07/2015  . Psoriasis    Past Surgical History:  Procedure Laterality Date  . ANAL STENOSIS  ~ 2010  . APPENDECTOMY  1980's  . BOWEL RESECTION  2009  . CHOLECYSTECTOMY  1990's  . COLONOSCOPY  2013  . COLONOSCOPY WITH PROPOFOL N/A 03/12/2017   Procedure: COLONOSCOPY WITH PROPOFOL;  Surgeon: Rogene Houston, MD;  [facility];  Service: Endoscopy;  Laterality: N/A;  2:05-moved up to 1025 per Lelon Frohlich  . COLOSTOMY  2009   DEMASO  . COLOSTOMY TAKEDOWN  2009   "only had it for about 1 month" (08/22/2013)  . ESOPHAGEAL DILATION N/A 03/12/2017   Procedure: ESOPHAGEAL DILATION;  Surgeon: Rogene Houston, MD;  [facility];  Service: Endoscopy;  Laterality: N/A;  . ESOPHAGOGASTRODUODENOSCOPY  11/11/2011   Procedure: ESOPHAGOGASTRODUODENOSCOPY (EGD);  Surgeon: Missy Sabins, MD;  Location[facility];  Service: Endoscopy;  Laterality: N/A;  . ESOPHAGOGASTRODUODENOSCOPY  05/18/2012   Procedure: ESOPHAGOGASTRODUODENOSCOPY (EGD);  Surgeon: Rogene Houston, MD;  [facility];  Service: Endoscopy;  Laterality: N/A;  200  . ESOPHAGOGASTRODUODENOSCOPY (EGD) WITH PROPOFOL N/A 03/12/2017   Procedure: ESOPHAGOGASTRODUODENOSCOPY (EGD) WITH PROPOFOL;  Surgeon: Rogene Houston, MD;  [facility];  Service: Endoscopy;  Laterality: N/A;  . FLEXIBLE SIGMOIDOSCOPY  11/11/2011   Procedure: FLEXIBLE SIGMOIDOSCOPY;  Surgeon: Missy Sabins, MD;  Location: [place];  Service: Endoscopy;  Laterality: N/A;  . goiter  1999  . INCISION AND DRAINAGE  PERIRECTAL ABSCESS N/A 04/21/2017   Procedure: IRRIGATION AND DEBRIDEMENT PERIRECTAL ABSCESS;  Surgeon: Coralie Keens, MD;  Location: [place];  Service: General;  Laterality: N/A;  . LEFT HEART CATHETERIZATION WITH CORONARY ANGIOGRAM N/A 07/09/2014   Procedure: LEFT HEART CATHETERIZATION WITH CORONARY ANGIOGRAM;  Surgeon: Peter M Martinique, MD;  Location: [facility] CATH LAB;  Service: Cardiovascular;  Laterality: N/A;  . PROCTOSCOPY N/A 01/31/2015   Procedure: RIGID PROCTOSCOPY;  Surgeon: Leighton Ruff, MD;  Location: [place];  Service: General;  Laterality: N/A;  . RECTAL EXAM UNDER ANESTHESIA N/A 01/31/2015   Procedure: RECTAL EXAM UNDER ANESTHESIA WITH PLACEMENT OF A SETON;  Surgeon: Leighton Ruff, MD;  Location: [place];  Service: General;  Laterality: N/A;   Social History:  reports that she quit smoking about 20 years ago. Her smoking use included Cigarettes. She has a 0.48 pack-year smoking history. She has never used smokeless tobacco. She reports that she does not drink alcohol or use drugs.  Allergies  Allergen Reactions  . Zithromax [Azithromycin] Swelling and Other (See Comments)    Patient states that she has a swollen mouth/ blisters, yeast present mouth and vaginia.  . Aspirin Other (See Comments)    Cramps her stomach  . Ibuprofen Other (See Comments)    Patient preference  . Sulfa Antibiotics Nausea And Vomiting   Family History  Problem Relation Age of Onset  . Diabetes Mother   . Heart disease Father   . Healthy Sister   . Hypertension Brother   . Colon cancer Brother        died with colon cancer  . Crohn's disease Paternal Aunt  Prior to Admission medications   Medication Sig Start Date End Date Taking? Authorizing Provider  acetaminophen (TYLENOL) 500 MG tablet Take 1,000 mg by mouth every 6 (six) hours as needed for mild pain or moderate pain.   Yes [provider]  Calcium Carbonate-Vitamin D (CALCIUM + D PO) Take 1 tablet by mouth daily.   Yes [provider]  Cyanocobalamin (VITAMIN B-12 IJ) Inject 1 each as directed every 30 (thirty) days. Around the 1st week of each month   Yes [provider]  dicyclomine (BENTYL) 10 MG/5ML syrup TAKE 10 MLS (20 MG TOTAL) BY MOUTH 3 (THREE) TIMES DAILY AS NEEDED. Patient taking differently: Take 20 mg by mouth 3 (three) times daily as needed (for stomach).  03/18/17  Yes Rehman, Mechele Dawley, MD  diphenoxylate-atropine (LOMOTIL) 2.5-0.025 MG tablet TAKE 1 TABLET BY MOUTH 4 TIMES A DAY AS NEEDED FOR DIARRHEA/LOOSE STOOL 03/31/17  Yes Rehman, Mechele Dawley, MD  inFLIXimab (REMICADE) 100 MG injection Inject 100 mg into the vein every 6 (six) weeks. Next is due 03-11-2017   Yes [provider]  megestrol (MEGACE) 40 MG/ML suspension Take 5 mLs (200 mg total) by mouth daily. 04/09/17  Yes Rehman, Mechele Dawley, MD  Multiple Vitamin (MULTIVITAMIN WITH MINERALS) TABS Take 1 tablet by mouth daily.   Yes [provider]  nystatin (NYSTATIN) powder USE TO AFFECTED AREA TWICE DAILY Patient taking differently: Apply 1 g topically 2 (two) times daily as needed. USE TO AFFECTED FOR ITCHING 02/18/17  Yes Rehman, Mechele Dawley, MD  nystatin cream (MYCOSTATIN) Apply 1 application topically 2 (two) times daily as needed for dry skin. 02/18/17  Yes Rehman, Mechele Dawley, MD  pantoprazole (PROTONIX) 40 MG tablet TAKE 1 TABLET (40 MG TOTAL) BY MOUTH 2 (TWO) TIMES DAILY BEFORE A MEAL. 05/25/16  Yes Setzer, Terri L, NP  potassium chloride SA (K-DUR,KLOR-CON) 20 MEQ tablet Take 1 tablet (20 mEq total) by mouth daily. Take potassium on days you take furosemide. 04/14/17  Yes Rehman, Mechele Dawley, MD  promethazine (PHENERGAN) 25 MG tablet Take 1 tablet (25 mg total) by mouth 2 (two) times daily as needed. Patient taking differently: Take 25 mg by mouth 2 (two) times daily as needed for nausea or vomiting.  02/18/17  Yes Rehman, Mechele Dawley, MD  triamcinolone cream (KENALOG) 0.1 % APPLY TO AFFECTED AREA TWICE A DAY AS NEEDED 03/10/17  Yes Rehman,  Mechele Dawley, MD  Vitamin D, Ergocalciferol, (DRISDOL) 50000 units CAPS capsule Take 1 capsule (50,000 Units total) by mouth every 7 (seven) days. Patient taking differently: Take 50,000 Units by mouth every 7 (seven) days. On Mondays 07/19/16  Yes Rehman, Mechele Dawley, MD  albuterol (PROVENTIL HFA;VENTOLIN HFA) 108 (90 Base) MCG/ACT inhaler Inhale 2 puffs into the lungs every 6 (six) hours as needed for wheezing. 12/09/15   Rama, Venetia Maxon, MD  furosemide (LASIX) 40 MG tablet Take 1 tablet (40 mg total) by mouth daily as needed for edema. 06/30/16   Rogene Houston, MD  HYDROcodone-acetaminophen (NORCO) 7.5-325 MG tablet Take 1 tablet by mouth every 4 (four) hours as needed for moderate pain. 10/16/16   Dorie Rank, MD  spironolactone (ALDACTONE) 50 MG tablet Take 1 tablet (50 mg total) by mouth daily. Patient not taking: Reported on 04/22/2017 12/09/15   Rama, Venetia Maxon, MD   Physical Exam: Blood pressure (!) 98/57, pulse (!) 109, temperature 97.5 F (36.4 C), temperature source Oral, resp. rate 20, height 5\' 4"  (1.626 m), weight 58.4 kg (  128 lb 11.2 oz), SpO2 100 %. Vitals:   04/27/17 0619 04/27/17 0620  BP: (!) 98/57   Pulse: (!) 112 (!) 109  Resp: 20   Temp: 97.5 F (36.4 C)      General:  Sitting up in bed watching TV calm cooperative in no acute distress  Eyes: Pupils equal round reactive to light EOMI  ENT: Ears clear nose without drainage oropharynx with slightly swollen tongue mucosa beefy red no white patches  Neck: Supple no lymphadenopathy full range of motion  Cardiovascular: Tachycardic but regular no murmur gallop or rub no lower extremity edema pedal pulses present and palpable  Respiratory: Normal effort breath sounds slightly distant but clear bilaterally I hear no wheeze no rhonchi  Abdomen: Soft positive bowel sounds but somewhat sluggish no guarding or rebounding  Skin: Warm and dry no rash or lesions  Musculoskeletal: Joints without swelling/erythema full range of  motion  Psychiatric: Calm cooperative appropriate  Neurologic: Speech clear facial symmetry bilateral grip 5 out of 5 lower shin a strength 5 out of 5  Labs on Admission:  Basic Metabolic Panel:  Recent Labs Lab 04/22/17 1452 04/23/17 0642 04/24/17 0415 04/25/17 0321 04/27/17 0919  NA 138 141 141 142 139  K 3.3* 3.0* 3.0* 2.9* 3.1*  CL 118* 121* 124* 123* 121*  CO2 12* 12* 11* 8* 7*  GLUCOSE 205* 102* 93 106* 95  BUN 12 7 5* <5* 10  CREATININE 1.41* 1.45* 1.44* 1.59* 1.61*  CALCIUM 6.5* 7.1* 6.8* 6.9* 7.1*   Liver Function Tests: No results for input(s): AST, ALT, ALKPHOS, BILITOT, PROT, ALBUMIN in the last 168 hours. No results for input(s): LIPASE, AMYLASE in the last 168 hours. No results for input(s): AMMONIA in the last 168 hours. CBC:  Recent Labs Lab 04/22/17 0424 04/23/17 0642 04/24/17 0415 04/25/17 0321 04/27/17 0919  WBC 35.6* 29.3* 21.7* 24.2* 24.3*  HGB 10.7* 10.9* 9.9* 10.3* 10.5*  HCT 31.7* 33.1* 29.8* 31.2* 31.8*  MCV 77.3* 78.1 77.6* 77.8* 78.1  PLT 268 279 265 270 268   Cardiac Enzymes:  Recent Labs Lab 04/23/17 1043 04/23/17 1454 04/23/17 1953  TROPONINI 0.06* 0.03* 0.05*   BNP: Invalid input(s): POCBNP CBG: No results for input(s): GLUCAP in the last 168 hours.  Radiological Exams on Admission: Ct Maxillofacial W Contrast  Result Date: 04/26/2017 CLINICAL DATA:  Sudden facial swelling. EXAM: CT MAXILLOFACIAL WITH CONTRAST TECHNIQUE: Multidetector CT imaging of the maxillofacial structures was performed. Multiplanar CT image reconstructions were also generated. A small metallic BB was placed on the right temple in order to reliably differentiate right from left. COMPARISON:  MRI brain 04/25/2017. FINDINGS: Osseous: No fracture or mandibular dislocation. No destructive process. Orbits: Negative. No traumatic or inflammatory finding. Sinuses: Mild mucosal thickening in the maxillary and ethmoid sinuses. No significant opacity or layering  fluid. Soft tissues: No evidence for significant cellulitis or subcutaneous edema or air. Limited intracranial: Unremarkable. IMPRESSION: No evidence for significant facial cellulitis, subcutaneous edema, tear, or other significant process. No underlying osseous abnormality. Electronically Signed   By: Staci Righter M.D.   On: 04/26/2017 17:25    EKG: Independently reviewed. Sinus tachycardia Left axis deviation Left ventricular hypertrophy with repolarization abnormality  Time spent: 50 minutes  [facility]   If 7PM-7AM, please contact night-coverage [email or web address] Password [email or web address] 04/27/2017, 10:08 AM

## 2017-04-27 NOTE — Progress Notes (Signed)
6 Days Post-Op    CC:  Perirectal pain  Subjective: Pt seems to be doing better from her facial swelling today, CT did not show any source.  The packing site for the perirectal abscess is clean and looks good.  It was not packed to the base and I have discussed that with the nurse who has her for the first time today.  She has allot of peroneal erythema from her vagina to her buttock from this vaginal drainage.  She has had 2 days of Diflucan and I will continue that. She also complains of decreased appetite since Mothers day.  Also a report of tachycardia, now on telem with some wide complex tachycardia on telemetry.  Objective: Vital signs in last 24 hours: Temp:  [97.5 F (36.4 C)-98.6 F (37 C)] 97.5 F (36.4 C) (05/22 0619) Pulse Rate:  [100-112] 109 (05/22 0620) Resp:  [18-28] 20 (05/22 0619) BP: (98-106)/(57-65) 98/57 (05/22 0619) SpO2:  [100 %] 100 % (05/22 0620) Last BM Date: 04/26/17 240 PO recorded 615 IV Urine x 2 Emesis x 1 BM x 1 Afebrile, VSS  Labs and UA are not done/ last labs on 04/25/17  MRA 04/25/17 remote lacunar infarct anterior left internal capsule, pt refused full study  CT maxillofacial 04/26/17: No evidence for significant facial cellulitis, subcutaneous edema, tear, or other significant process. No underlying osseous abnormality.   Intake/Output from previous day: 05/21 0701 - 05/22 0700 In: 855 [P.O.:240; I.V.:615] Out: 2 [Emesis/NG output:1; Stool:1] Intake/Output this shift: No intake/output data recorded.  General appearance: alert, cooperative, no distress and facial swelling is better. Head: atraumatic, facail swelling is better, no complaints and she thinks it is better Resp: clear to auscultation bilaterally GI: soft, non-tender; bowel sounds normal; no masses,  no organomegaly Pelvic: She has drainage and the entire vaginal area going to the rectum is red and tender, very moist, no real discharge today noted on exam.  She says that is  better, but she is red, the whole area is like bad diaper rash.  The perirectal abscess is really looking good. Continue would care.  Lab Results:   Recent Labs  04/25/17 0321  WBC 24.2*  HGB 10.3*  HCT 31.2*  PLT 270    BMET  Recent Labs  04/25/17 0321  NA 142  K 2.9*  CL 123*  CO2 8*  GLUCOSE 106*  BUN <5*  CREATININE 1.59*  CALCIUM 6.9*   PT/INR No results for input(s): LABPROT, INR in the last 72 hours.  No results for input(s): AST, ALT, ALKPHOS, BILITOT, PROT, ALBUMIN in the last 168 hours.   Lipase     Component Value Date/Time   LIPASE 20 02/03/2017 0310     Medications: . acetaminophen (TYLENOL) oral liquid 160 mg/5 mL  500 mg Oral Q6H  . aspirin EC  81 mg Oral Daily  . dicyclomine  20 mg Oral TID AC  . enoxaparin (LOVENOX) injection  30 mg Subcutaneous Daily  . feeding supplement  1 Container Oral TID BM  . fluconazole  100 mg Oral Daily  . metroNIDAZOLE  500 mg Oral Q8H  . multivitamin with minerals  1 tablet Oral Daily  . potassium chloride  20 mEq Oral BID  . saccharomyces boulardii  250 mg Oral BID   . cefTRIAXone (ROCEPHIN)  IV Stopped (04/26/17 1203)  . dextrose 5 % and 0.45 % NaCl with KCl 20 mEq/L 50 mL/hr at 04/27/17 0417   Anti-infectives    Start  Dose/Rate Route Frequency Ordered Stop   04/26/17 1400  metroNIDAZOLE (FLAGYL) tablet 500 mg     500 mg Oral Every 8 hours 04/26/17 1018     04/26/17 1000  fluconazole (DIFLUCAN) tablet 100 mg     100 mg Oral Daily 04/25/17 1531     04/25/17 1630  fluconazole (DIFLUCAN) IVPB 200 mg     200 mg 100 mL/hr over 60 Minutes Intravenous  Once 04/25/17 1531 04/25/17 1713   04/21/17 1245  cefTRIAXone (ROCEPHIN) 2 g in dextrose 5 % 50 mL IVPB    Comments:  Pharmacy may adjust dosing strength / duration / interval for maximal efficacy   2 g 100 mL/hr over 30 Minutes Intravenous Daily 04/21/17 1226     04/21/17 1230  metroNIDAZOLE (FLAGYL) IVPB 500 mg  Status:  Discontinued     500 mg 100 mL/hr  over 60 Minutes Intravenous Every 6 hours 04/21/17 1226 04/26/17 1018      Assessment/Plan Perirectal abscess/Crohn's disease Immunosuppression on Remicade I&D perirectal abscess 04/21/17, Dr. 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 Sepsis with aspiration pneumonia 12/2015 Chronic kidney disease Prior CVA  FEN: Soft diet ID:  Ceftriaxone 2 gm 5/16 =>> day 6 -no access        Flagyl 2 gm 5/16 =>>  converted to PO day 7 - hold for now        Diflucan 200 mg 5/20 x 1 Diflucan 100 PO 5/21 =>> day 2 total - will continue DVT:  Lovenox Consults:  Cardiology - atypical chest pain - sign off 5/19                   Neurology - facial droop/swelling not a stroke, ENT/Dental consult recommended  Plan:  Multiple issues;  Drainage and cellulitis around vaginal drainage - Diflucan- culture pending Facial swelling better - CT unremarkable Urine and labs still pending Tachycardia - repeat EKG and get Medicine to see. Perirectal abscess is looking fine, if this were her only issue I would send her home today.  Medicine consult today.  I have changed the labs to Stat in order to get them done [facility] evidence for significant facial cellulitis, subcutaneous edema, tear, or other significant process. No underlying osseous Abnormality..    LOS: 5 days    Lamond Glantz 04/27/2017 [phone]

## 2017-04-27 NOTE — Progress Notes (Signed)
PT Cancellation Note  Patient Details Name: Danielle [name] MRN: [id number] DOB: [birth year]   Cancelled Treatment:    Reason Eval/Treat Not Completed: Patient declined, no reason specified   [place] 04/27/2017, 4:04 PM [facility] PT [phone]

## 2017-04-28 DIAGNOSIS — K50013 Crohn's disease of small intestine with fistula: Secondary | ICD-10-CM

## 2017-04-28 DIAGNOSIS — N183 Chronic kidney disease, stage 3 (moderate): Secondary | ICD-10-CM

## 2017-04-28 LAB — VAS US CAROTID
LCCADDIAS: 60 cm/s
LCCADSYS: 141 cm/s
LEFT ECA DIAS: -23 cm/s
LEFT VERTEBRAL DIAS: 36 cm/s
LICADDIAS: -33 cm/s
LICADSYS: -85 cm/s
LICAPDIAS: -29 cm/s
Left CCA prox dias: 25 cm/s
Left CCA prox sys: 111 cm/s
Left ICA prox sys: -119 cm/s
RCCAPDIAS: 33 cm/s
RCCAPSYS: 148 cm/s
RIGHT ECA DIAS: -21 cm/s
RIGHT VERTEBRAL DIAS: 31 cm/s
Right cca dist sys: -81 cm/s

## 2017-04-28 LAB — BASIC METABOLIC PANEL
Anion gap: 11 (ref 5–15)
BUN: 10 mg/dL (ref 6–20)
CALCIUM: 7.6 mg/dL — AB (ref 8.9–10.3)
CHLORIDE: 122 mmol/L — AB (ref 101–111)
CO2: 7 mmol/L — AB (ref 22–32)
Creatinine, Ser: 1.42 mg/dL — ABNORMAL HIGH (ref 0.44–1.00)
GFR calc Af Amer: 43 mL/min — ABNORMAL LOW (ref >60)
GFR, EST NON AFRICAN AMERICAN: 37 mL/min — AB (ref >60)
Glucose, Bld: 79 mg/dL (ref 65–99)
Potassium: 3.4 mmol/L — ABNORMAL LOW (ref 3.5–5.1)
SODIUM: 140 mmol/L (ref 135–145)

## 2017-04-28 LAB — C DIFFICILE QUICK SCREEN W PCR REFLEX
C Diff antigen: NEGATIVE
C Diff interpretation: NOT DETECTED
C Diff toxin: NEGATIVE

## 2017-04-28 LAB — CBC
HEMATOCRIT: 30.1 % — AB (ref 36.0–46.0)
Hemoglobin: 9.8 g/dL — ABNORMAL LOW (ref 12.0–15.0)
MCH: 25.3 pg — ABNORMAL LOW (ref 26.0–34.0)
MCHC: 32.6 g/dL (ref 30.0–36.0)
MCV: 77.8 fL — AB (ref 78.0–100.0)
PLATELETS: 295 10*3/uL (ref 150–400)
RBC: 3.87 MIL/uL (ref 3.87–5.11)
RDW: 18.4 % — AB (ref 11.5–15.5)
WBC: 16.3 10*3/uL — ABNORMAL HIGH (ref 4.0–10.5)

## 2017-04-28 LAB — MAGNESIUM: Magnesium: 2.7 mg/dL — ABNORMAL HIGH (ref 1.7–2.4)

## 2017-04-28 MED ORDER — LORATADINE 10 MG PO TABS: 10.0000 mg | ORAL_TABLET | Freq: Once | ORAL | Status: DC

## 2017-04-28 MED ORDER — SODIUM CHLORIDE 0.9 % IV SOLN
INTRAVENOUS | Status: DC
  Administered 2017-04-28: 23:00:00 via INTRAVENOUS

## 2017-04-28 MED ORDER — ACETAMINOPHEN 325 MG PO TABS: 650.0000 mg | ORAL_TABLET | Freq: Once | ORAL | Status: DC

## 2017-04-28 MED ORDER — POTASSIUM CHLORIDE CRYS ER 20 MEQ PO TBCR
20.0000 meq | EXTENDED_RELEASE_TABLET | Freq: Three times a day (TID) | ORAL | Status: DC
  Administered 2017-04-28 (×2): 20 meq via ORAL
  Filled 2017-04-28 (×2): qty 1

## 2017-04-28 MED ORDER — SODIUM CHLORIDE 0.9 % IV SOLN: 5.0000 mg/kg | Freq: Once | INTRAVENOUS | Status: DC

## 2017-04-28 MED ORDER — POTASSIUM CHLORIDE CRYS ER 20 MEQ PO TBCR: 20.0000 meq | EXTENDED_RELEASE_TABLET | Freq: Two times a day (BID) | ORAL | Status: DC

## 2017-04-28 NOTE — Progress Notes (Signed)
[facility] PROGRESS NOTE  Danielle [name]:[id number] DOB: October 06, 1946 DOA: 04/21/2017   PCP: Iona Beard, MD     Assessment/Plan: Principal Problem:   Tachycardia Active Problems:   Leukocytosis   Hypomagnesemia   Crohn's disease with fistula (HCC)   CKD (chronic kidney disease) stage 3, GFR 30-59 ml/min   Immunosuppressed status (HCC)   Perirectal abscess   Left buttock abscess   Perineal irritation in female   Mucosal irritation of oral cavity   Hypokalemia   Chest pain   Facial swelling   Right facial numbness   71 y.o. female who presented to [facility] hospital for admission to the general surgery service for treatment of a perirectal abscess. Patient underwent operative management on 04/21/2017. Patient's hospitalization has been complicated by chest pain and facial numbness. Cardiology and neurology of both been consult at on this patient and both services have signed off as there is no evidence of acute problems requiring further management. Hospital services were consulted for general medical review of patient's condition and for recommendations.  [facility] managing     Hypokalemia, dehydration, thrush, perineal irritation/drainage.   Assessment and plan #1. Sinus  Tachycardia. Likely related to decreased oral intake as well as hypokalemia. She was evaluated over the weekend by cardiology for atypical chest pain who opined pain was pleural. Tachycardia improved with fluids. Current EKG no acute changes. Currently chest pain-free. -IV fluids -Hold Lasix for now -Replete potassium, magnesium repleted   Would continue telemetry   #2. Chronic kidney disease stage III. Current creatinine 1.6. This appears to be close to her baseline. -Monitor urine output -Gentle IV fluids -Hold nephrotoxins    #3. Hypokalemia. Related to decreased oral intake. Potassium level 3.4 Continue by mouth potassium, dose increased to 3 times a day Hold Lasix    4. Mucosal  irritation of the oral cavity. Etiology uncertain. Improving today. Concern for thrush. Pain with swallowing. Airway patent. Improving after 2 doses of Diflucan -continue Diflucan -swish and spit -monitor  #5.leukocytosis. Chronic in chron's patient. Not on steroids. Afebrile, non-toxic appearing. WBC Trending down. Currently close to what appears to be baseline. Surgical site clean and dry. Has been on rocephin.  -urinalysis -follow urine culture  #6. Perineal irritation. Etiology uncertain. Patient denies any vaginal discharge. Cultures sent yesterday. No burning or pain except with urination. -follow culture -OP follow up  #7. Perirectal abscess status post I and D by surgery. -Defer management to Gen. Surgery, leukocytosis improving  #8. Crohn's disease with history of fistula. Appears stable at baseline. Home medication includes Remicade and bentlyl -Continue home meds  #9 anemia of chronic disease Hemoglobin stable      DVT prophylaxsis Lovenox  Code Status:  Full code    Family Communication: Discussed in detail with the patient, all imaging results, lab results explained to the patient   Disposition Plan:  As per surgery      Consultants:  [facility]        Antibiotics: Anti-infectives    Start     Dose/Rate Route Frequency Ordered Stop   04/27/17 1100  metroNIDAZOLE (FLAGYL) tablet 500 mg  Status:  Discontinued     500 mg Oral Every 8 hours 04/27/17 1041 04/27/17 1703   04/27/17 1000  fluconazole (DIFLUCAN) tablet 150 mg     150 mg Oral Daily 04/27/17 0859     04/26/17 1400  metroNIDAZOLE (FLAGYL) tablet 500 mg  Status:  Discontinued     500 mg  Oral Every 8 hours 04/26/17 1018 04/27/17 0901   04/26/17 1000  fluconazole (DIFLUCAN) tablet 100 mg  Status:  Discontinued     100 mg Oral Daily 04/25/17 1531 04/27/17 0918   04/25/17 1630  fluconazole (DIFLUCAN) IVPB 200 mg     200 mg 100 mL/hr over 60 Minutes Intravenous  Once 04/25/17 1531 04/25/17 1713    04/21/17 1245  cefTRIAXone (ROCEPHIN) 2 g in dextrose 5 % 50 mL IVPB  Status:  Discontinued    Comments:  Pharmacy may adjust dosing strength / duration / interval for maximal efficacy   2 g 100 mL/hr over 30 Minutes Intravenous Daily 04/21/17 1226 04/27/17 1703   04/21/17 1230  metroNIDAZOLE (FLAGYL) IVPB 500 mg  Status:  Discontinued     500 mg 100 mL/hr over 60 Minutes Intravenous Every 6 hours 04/21/17 1226 04/26/17 1018         HPI/Subjective: Thrush improved, bottom still sore , not eating   Objective: Vitals:   04/27/17 0620 04/27/17 1420 04/27/17 2037 04/28/17 0600  BP:  100/60 (!) 112/51 109/66  Pulse: (!) 109 (!) 116 92 93  Resp:  19 18 20   Temp:  97.9 F (36.6 C) 98.3 F (36.8 C) 98.2 F (36.8 C)  TempSrc:  Oral    SpO2: 100% 98% 95% 100%  Weight:      Height:        Intake/Output Summary (Last 24 hours) at 04/28/17 1214 Last data filed at 04/28/17 1045  Gross per 24 hour  Intake           636.25 ml  Output              750 ml  Net          -113.75 ml    Exam:  Examination:  General exam: Appears calm and comfortable  Respiratory system: Clear to auscultation. Respiratory effort normal. Cardiovascular system: S1 & S2 heard, RRR. No JVD, murmurs, rubs, gallops or clicks. No pedal edema. Gastrointestinal system: Abdomen is nondistended, soft and nontender. No organomegaly or masses felt. Normal bowel sounds heard. Central nervous system: Alert and oriented. No focal neurological deficits. Extremities: Symmetric 5 x 5 power. Skin: No rashes, lesions or ulcers Psychiatry: Judgement and insight appear normal. Mood & affect appropriate.     Data Reviewed: I have personally reviewed following labs and imaging studies  Micro Results Recent Results (from the past 240 hour(s))  Culture, routine-genital     Status: None (Preliminary result)   Collection Time: 04/26/17 12:46 PM  Result Value Ref Range Status   Specimen Description VAGINA  Final   Special  Requests Immunocompromised  Final   Culture MODERATE YEAST  Final   Report Status PENDING  Incomplete    Radiology Reports Dg Chest 2 View  Result Date: 04/23/2017 CLINICAL DATA:  Chest pain EXAM: CHEST  2 VIEW COMPARISON:  02/03/2017, 10/16/2016, 10/06/2016 FINDINGS: Large hiatal hernia containing dilated bowel, increased compared to 10/16/2016, similar compared with 09/18/2016. No pleural effusion or focal consolidation. Stable cardiomediastinal silhouette. No pneumothorax. Surgical clips in the right upper quadrant. IMPRESSION: No focal consolidation or effusion. Large hiatal hernia containing air filled dilated bowel. Electronically Signed   By: Donavan Foil M.D.   On: 04/23/2017 17:40   Ct Head Wo Contrast  Result Date: 04/25/2017 CLINICAL DATA:  Right facial numbness EXAM: CT HEAD WITHOUT CONTRAST TECHNIQUE: Contiguous axial images were obtained from the base of the skull through the vertex without intravenous contrast. COMPARISON:  Head CT 08/31/2011 FINDINGS: Brain: No mass lesion, intraparenchymal hemorrhage or extra-axial collection. No evidence of acute cortical infarct. 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 Brain parenchyma is otherwise normal for age. Vascular: No hyperdense vessel or unexpected calcification. Skull: Normal visualized skull base, calvarium and extracranial soft tissues. Sinuses/Orbits: No sinus fluid levels or advanced mucosal thickening. No mastoid effusion. Normal orbits. IMPRESSION: Unchanged left internal capsule lacunar infarct compared to 08/31/2011. No acute intracranial abnormality. Electronically Signed   By: Ulyses Jarred M.D.   On: 04/25/2017 03:43   Mr Jodene Nam Head Wo Contrast  Result Date: 04/25/2017 CLINICAL DATA:  Patient hospitalized 4 days ago for I & D of perirectal abscess. New onset of right-sided facial numbness and tingling beginning at 2:40 a.m. today. Possible right facial droop. The examination had to be discontinued prior to  completion due to patient refusal for further imaging. Diffusion-weighted imaging, sagittal T1 weighted sequences, axial gradient sequences, and MRA time-of-flight sequences were performed. EXAM: MRI HEAD WITHOUT CONTRAST MRA HEAD WITHOUT CONTRAST TECHNIQUE: Multiplanar, multiecho pulse sequences of the brain and surrounding structures were obtained without intravenous contrast. Angiographic images of the head were obtained using MRA technique without contrast. COMPARISON:  CT head without contrast from the same day. FINDINGS: MRI HEAD FINDINGS Brain: A remote lacunar infarct is again noted in the anterior limb of the left internal capsule. The diffusion-weighted images demonstrate no acute or subacute infarction. Additional sequences were not performed. Vascular: Flow is present in the major intracranial arteries. Skull and upper cervical spine: The craniocervical junction is normal. Midline sagittal structures are unremarkable. Sinuses/Orbits: The paranasal sinuses and mastoid air cells are clear. The globes and orbits are unremarkable. MRA HEAD FINDINGS The internal carotid artery is are within normal limits the high cervical segments through the ICA termini bilaterally. The A1 and M1 segments are normal. Anterior communicating artery is patent. ACA can't MCA branch vessels are within normal limits. The vertebral arteries are codominant. The right PICA origin is visualized and normal. The left PICA is not visualized. The left AICA is dominant. Basilar artery is normal. Both posterior cerebral arteries originate basilar tip. PCA branch vessels are within normal limits. IMPRESSION: 1. No acute intracranial abnormality. The study was discontinued early as the patient refused to complete the exam. 2. 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 3. Normal variant MRA circle of Willis without significant proximal stenosis, aneurysm, or branch vessel occlusion. Electronically Signed   By: San Morelle M.D.   On: 04/25/2017 11:13   Mr Brain Wo Contrast  Result Date: 04/25/2017 CLINICAL DATA:  Patient hospitalized 4 days ago for I & D of perirectal abscess. New onset of right-sided facial numbness and tingling beginning at 2:40 a.m. today. Possible right facial droop. The examination had to be discontinued prior to completion due to patient refusal for further imaging. Diffusion-weighted imaging, sagittal T1 weighted sequences, axial gradient sequences, and MRA time-of-flight sequences were performed. EXAM: MRI HEAD WITHOUT CONTRAST MRA HEAD WITHOUT CONTRAST TECHNIQUE: Multiplanar, multiecho pulse sequences of the brain and surrounding structures were obtained without intravenous contrast. Angiographic images of the head were obtained using MRA technique without contrast. COMPARISON:  CT head without contrast from the same day. FINDINGS: MRI HEAD FINDINGS Brain: A remote lacunar infarct is again noted in the anterior limb of the left internal capsule. The diffusion-weighted images demonstrate no acute or subacute infarction. Additional sequences were not performed. Vascular: Flow is present in the major intracranial arteries. Skull  and upper cervical spine: The craniocervical junction is normal. Midline sagittal structures are unremarkable. Sinuses/Orbits: The paranasal sinuses and mastoid air cells are clear. The globes and orbits are unremarkable. MRA HEAD FINDINGS The internal carotid artery is are within normal limits the high cervical segments through the ICA termini bilaterally. The A1 and M1 segments are normal. Anterior communicating artery is patent. ACA can't MCA branch vessels are within normal limits. The vertebral arteries are codominant. The right PICA origin is visualized and normal. The left PICA is not visualized. The left AICA is dominant. Basilar artery is normal. Both posterior cerebral arteries originate basilar tip. PCA branch vessels are within normal limits. IMPRESSION: 1. No  acute intracranial abnormality. The study was discontinued early as the patient refused to complete the exam. 2. 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 3. Normal variant MRA circle of Willis without significant proximal stenosis, aneurysm, or branch vessel occlusion. Electronically Signed   By: San Morelle M.D.   On: 04/25/2017 11:13   Ct Maxillofacial W Contrast  Result Date: 04/26/2017 CLINICAL DATA:  Sudden facial swelling. EXAM: CT MAXILLOFACIAL WITH CONTRAST TECHNIQUE: Multidetector CT imaging of the maxillofacial structures was performed. Multiplanar CT image reconstructions were also generated. A small metallic BB was placed on the right temple in order to reliably differentiate right from left. COMPARISON:  MRI brain 04/25/2017. FINDINGS: Osseous: No fracture or mandibular dislocation. No destructive process. Orbits: Negative. No traumatic or inflammatory finding. Sinuses: Mild mucosal thickening in the maxillary and ethmoid sinuses. No significant opacity or layering fluid. Soft tissues: No evidence for significant cellulitis or subcutaneous edema or air. Limited intracranial: Unremarkable. IMPRESSION: No evidence for significant facial cellulitis, subcutaneous edema, tear, or other significant process. No underlying osseous abnormality. Electronically Signed   By: Staci Righter M.D.   On: 04/26/2017 17:25     CBC  Recent Labs Lab 04/23/17 1062 04/24/17 0415 04/25/17 0321 04/27/17 0919 04/28/17 0349  WBC 29.3* 21.7* 24.2* 24.3* 16.3*  HGB 10.9* 9.9* 10.3* 10.5* 9.8*  HCT 33.1* 29.8* 31.2* 31.8* 30.1*  PLT 279 265 270 268 295  MCV 78.1 77.6* 77.8* 78.1 77.8*  MCH 25.7* 25.8* 25.7* 25.8* 25.3*  MCHC 32.9 33.2 33.0 33.0 32.6  RDW 17.9* 17.8* 17.9* 18.4* 18.4*    Chemistries   Recent Labs Lab 04/23/17 0642 04/24/17 0415 04/25/17 0321 04/27/17 0919 04/28/17 0349  NA 141 141 142 139 140  K 3.0* 3.0* 2.9* 3.1* 3.4*  CL 121* 124* 123* 121* 122*   CO2 12* 11* 8* 7* 7*  GLUCOSE 102* 93 106* 95 79  BUN 7 5* <5* 10 10  CREATININE 1.45* 1.44* 1.59* 1.61* 1.42*  CALCIUM 7.1* 6.8* 6.9* 7.1* 7.6*  MG  --   --   --  1.2* 2.7*   ------------------------------------------------------------------------------------------------------------------ estimated creatinine clearance is 32.3 mL/min (A) (by C-G formula based on SCr of 1.42 mg/dL (H)). ------------------------------------------------------------------------------------------------------------------ No results for input(s): HGBA1C in the last 72 hours. ------------------------------------------------------------------------------------------------------------------ No results for input(s): CHOL, HDL, LDLCALC, TRIG, CHOLHDL, LDLDIRECT in the last 72 hours. ------------------------------------------------------------------------------------------------------------------ No results for input(s): TSH, T4TOTAL, T3FREE, THYROIDAB in the last 72 hours.  Invalid input(s): FREET3 ------------------------------------------------------------------------------------------------------------------ No results for input(s): VITAMINB12, FOLATE, FERRITIN, TIBC, IRON, RETICCTPCT in the last 72 hours.  Coagulation profile No results for input(s): INR, PROTIME in the last 168 hours.  No results for input(s): DDIMER in the last 72 hours.  Cardiac Enzymes  Recent Labs Lab 04/23/17 1043 04/23/17 1454 04/23/17 1953  TROPONINI  0.06* 0.03* 0.05*   ------------------------------------------------------------------------------------------------------------------ Invalid input(s): POCBNP   CBG: No results for input(s): GLUCAP in the last 168 hours.     Studies: Ct Maxillofacial W Contrast  Result Date: 04/26/2017 CLINICAL DATA:  Sudden facial swelling. EXAM: CT MAXILLOFACIAL WITH CONTRAST TECHNIQUE: Multidetector CT imaging of the maxillofacial structures was performed. Multiplanar CT image  reconstructions were also generated. A small metallic BB was placed on the right temple in order to reliably differentiate right from left. COMPARISON:  MRI brain 04/25/2017. FINDINGS: Osseous: No fracture or mandibular dislocation. No destructive process. Orbits: Negative. No traumatic or inflammatory finding. Sinuses: Mild mucosal thickening in the maxillary and ethmoid sinuses. No significant opacity or layering fluid. Soft tissues: No evidence for significant cellulitis or subcutaneous edema or air. Limited intracranial: Unremarkable. IMPRESSION: No evidence for significant facial cellulitis, subcutaneous edema, tear, or other significant process. No underlying osseous abnormality. Electronically Signed   By: Staci Righter M.D.   On: 04/26/2017 17:25      Lab Results  Component Value Date   HGBA1C 5.4 04/25/2017   HGBA1C 5.6 01/26/2015   HGBA1C  12/17/2009    5.7 (NOTE) The ADA recommends the following therapeutic goal for glycemic control related to Hgb A1c measurement: Goal of therapy: <6.5 Hgb A1c  Reference: American Diabetes Association: Clinical Practice Recommendations 2010, Diabetes Care, 2010, 33: (Suppl  1).   Lab Results  Component Value Date   LDLCALC 24 04/25/2017   CREATININE 1.42 (H) 04/28/2017       Scheduled Meds: . acetaminophen (TYLENOL) oral liquid 160 mg/5 mL  500 mg Oral Q6H  . aspirin EC  81 mg Oral Daily  . dicyclomine  20 mg Oral TID AC  . enoxaparin (LOVENOX) injection  30 mg Subcutaneous Daily  . feeding supplement  1 Container Oral TID BM  . fluconazole  150 mg Oral Daily  . Gerhardt's butt cream   Topical BID  . magic mouthwash w/lidocaine  15 mL Oral TID  . megestrol  200 mg Oral Daily  . multivitamin with minerals  1 tablet Oral Daily  . potassium chloride  20 mEq Oral TID  . saccharomyces boulardii  250 mg Oral BID   Continuous Infusions:   LOS: 6 days    Time spent: >30 MINS    Reyne Dumas  [facility] Pager [phone]. If  7PM-7AM, please contact night-coverage at [email or web address], password [facility] 04/28/2017, 12:14 PM  LOS: 6 days

## 2017-04-28 NOTE — Progress Notes (Signed)
7 Days Post-Op    CC:  Perirectal pain  Subjective: Having allot of loose stools now.  perineum is still red and irritated, I do not see any drainage from the vagina this AM.  Perirectal abscess site looks fine.   Objective: Vital signs in last 24 hours: Temp:  [97.9 F (36.6 C)-98.3 F (36.8 C)] 98.2 F (36.8 C) (05/23 0600) Pulse Rate:  [92-116] 93 (05/23 0600) Resp:  [18-20] 20 (05/23 0600) BP: (100-112)/(51-66) 109/66 (05/23 0600) SpO2:  [95 %-100 %] 100 % (05/23 0600) Last BM Date: 04/27/17 60 PO recorded 450 IV 750 urine recorded BM x 9 Afebrile, VSS K+ up to 3.4 Creatinine improving, mag up to 2.7 WBC improving Specimen Description VAGINA   Special Requests Immunocompromised   Culture MODERATE YEAST      Intake/Output from previous day: 05/22 0701 - 05/23 0700 In: 516.3 [P.O.:60; I.V.:306.3; IV Piggyback:150] Out: 750 [Urine:750] Intake/Output this shift: No intake/output data recorded.  General appearance: alert, cooperative, no distress and chronically ill, walked less than 100 feet yesterday.  Resp: clear to auscultation bilaterally GI: soft, non-tender; bowel sounds normal; no masses,  no organomegaly Pelvic: perineium and buttocks still red and irritated.   extend barrier cream, I discussed with nurse.  Lab Results:   Recent Labs  04/27/17 0919 04/28/17 0349  WBC 24.3* 16.3*  HGB 10.5* 9.8*  HCT 31.8* 30.1*  PLT 268 295    BMET  Recent Labs  04/27/17 0919 04/28/17 0349  NA 139 140  K 3.1* 3.4*  CL 121* 122*  CO2 7* 7*  GLUCOSE 95 79  BUN 10 10  CREATININE 1.61* 1.42*  CALCIUM 7.1* 7.6*   PT/INR No results for input(s): LABPROT, INR in the last 72 hours.  No results for input(s): AST, ALT, ALKPHOS, BILITOT, PROT, ALBUMIN in the last 168 hours.   Lipase     Component Value Date/Time   LIPASE 20 02/03/2017 0310     Medications: . acetaminophen (TYLENOL) oral liquid 160 mg/5 mL  500 mg Oral Q6H  . aspirin EC  81 mg Oral  Daily  . dicyclomine  20 mg Oral TID AC  . enoxaparin (LOVENOX) injection  30 mg Subcutaneous Daily  . feeding supplement  1 Container Oral TID BM  . fluconazole  150 mg Oral Daily  . Gerhardt's butt cream   Topical BID  . magic mouthwash w/lidocaine  15 mL Oral TID  . megestrol  200 mg Oral Daily  . multivitamin with minerals  1 tablet Oral Daily  . potassium chloride  20 mEq Oral BID  . saccharomyces boulardii  250 mg Oral BID    Assessment/Plan Perirectal abscess/Crohn's disease Immunosuppression on Remicade I&D perirectal abscess 04/21/17, Dr. 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  If OK will plan D/C tomorrow.  Continue  to replace K+.  Discussed barrier cream with nurse also.      LOS: 6 days    Lucien Budney 04/28/2017 [phone]

## 2017-04-28 NOTE — Progress Notes (Signed)
PT Cancellation Note  Patient Details Name: Danielle [name] MRN: [id number] DOB: 1946/08/21   Cancelled Treatment:    Reason Eval/Treat Not Completed: Patient declined, no reason specified. Pt reports that she is tired and cannot work with PT.   [place] 04/28/2017, 3:47 PM

## 2017-04-28 NOTE — Progress Notes (Signed)
Occupational Therapy Treatment Patient Details Name: Danielle [name] MRN: [id number] DOB: 1946/03/05 Today's Date: 04/28/2017    History of present illness pt is a 71 yo female s/p incision and drainage of perirectal abscess. Pt with acute onset of numbness and tingling involving the right side of her face on 5/20 while in house with symptoms resolving (no acute findings on MRI/CT); Pt has a history of GERD, Crohn's disease, previous left lacunar infarction.    OT comments  Pt progressing towards goals, completing functional mobility transfers with MinGuard Assist and completing LB dressing with MinGuard assist. Pt with increased SOB during functional mobility transfers, subsiding with rest breaks. Pt will benefit from continued OT services to increase activity tolerance, safety, and independence with ADLs and functional mobility. Will continue to follow acutely.     Follow Up Recommendations  Home health OT;Supervision/Assistance - 24 hour    Equipment Recommendations  None recommended by OT          Precautions / Restrictions Precautions Precautions: Fall Precaution Comments: perirectal abscess Restrictions Weight Bearing Restrictions: No       Mobility Bed Mobility Overal bed mobility: Modified Independent Bed Mobility: Supine to Sit;Sit to Supine     Supine to sit: Modified independent (Device/Increase time);HOB elevated Sit to supine: Supervision   General bed mobility comments: increased time but no physical assist needed  Transfers Overall transfer level: Needs assistance Equipment used: None Transfers: Stand Pivot Transfers Sit to Stand: Min guard Stand pivot transfers: Min guard       General transfer comment: increased time    Balance Overall balance assessment: Needs assistance Sitting-balance support: No upper extremity supported;Feet supported Sitting balance-Leahy Scale: Fair     Standing balance support: Single extremity supported Standing  balance-Leahy Scale: Poor                             ADL either performed or assessed with clinical judgement   ADL Overall ADL's : Needs assistance/impaired Eating/Feeding: Independent;Sitting   Grooming: Wash/dry hands;Set up;Sitting Grooming Details (indicate cue type and reason): declined mobility to sink to complete             Lower Body Dressing: Min guard;Sit to/from stand Lower Body Dressing Details (indicate cue type and reason): doffing/donning briefs  Toilet Transfer: Min guard;Stand-pivot;BSC   Toileting- Clothing Manipulation and Hygiene: Min guard;Sit to/from stand       Functional mobility during ADLs: Min guard General ADL Comments: Pt completed bed mobility, stand pivot transfer to [facility], toileting, and seated grooming ADLs, increased SOB after toilet hygiene and transfer back to EOB, required seated reast break prior to return to supine                       Cognition Arousal/Alertness: Awake/alert Behavior During Therapy: WFL for tasks assessed/performed Overall Cognitive Status: Within Functional Limits for tasks assessed                                                      General Comments      Pertinent Vitals/ Pain       Pain Assessment: Faces Faces Pain Scale: No hurt  Frequency  Min 2X/week        Progress Toward Goals  OT Goals(current goals can now be found in the care plan section)  Progress towards OT goals: Progressing toward goals  Acute Rehab OT Goals Patient Stated Goal: go home OT Goal Formulation: With patient Time For Goal Achievement: 05/10/17 Potential to Achieve Goals: Good  Plan Discharge plan remains appropriate                     AM-PAC PT "6 Clicks" Daily Activity     Outcome Measure   Help from another person eating meals?: None Help from another person taking care of  personal grooming?: A Little Help from another person toileting, which includes using toliet, bedpan, or urinal?: A Little Help from another person bathing (including washing, rinsing, drying)?: A Little Help from another person to put on and taking off regular upper body clothing?: A Little Help from another person to put on and taking off regular lower body clothing?: A Little 6 Click Score: 19    End of Session    OT Visit Diagnosis: Muscle weakness (generalized) (M62.81);Unsteadiness on feet (R26.81)   Activity Tolerance Patient tolerated treatment well   Patient Left in bed;with call bell/phone within reach;with bed alarm set             Time: 6389-3734 OT Time Calculation (min): 15 min  Charges: OT General Charges $OT Visit: 1 Procedure OT Treatments $Self Care/Home Management : 8-22 mins  Lou Cal, OT Pager [phone] 04/28/2017   Raymondo Band 04/28/2017, 5:28 PM

## 2017-04-29 ENCOUNTER — Encounter (HOSPITAL_COMMUNITY)
Admission: RE | Admit: 2017-04-29 | Discharge: 2017-04-29 | Disposition: A | Discharge status: Home / Self Care | Payer: Medicare HMO | Source: Ambulatory Visit | Attending: Internal Medicine | Admitting: Internal Medicine

## 2017-04-29 ENCOUNTER — Inpatient Hospital Stay (HOSPITAL_COMMUNITY): Payer: Medicare HMO

## 2017-04-29 LAB — DIFFERENTIAL
Basophils Absolute: 0 10*3/uL (ref 0.0–0.1)
Basophils Relative: 0 %
EOS PCT: 2 %
Eosinophils Absolute: 0.3 10*3/uL (ref 0.0–0.7)
LYMPHS ABS: 5.3 10*3/uL — AB (ref 0.7–4.0)
LYMPHS PCT: 38 %
Monocytes Absolute: 1.1 10*3/uL — ABNORMAL HIGH (ref 0.1–1.0)
Monocytes Relative: 8 %
NEUTROS ABS: 7.4 10*3/uL (ref 1.7–7.7)
Neutrophils Relative %: 52 %

## 2017-04-29 LAB — COMPREHENSIVE METABOLIC PANEL
ALBUMIN: 3.1 g/dL — AB (ref 3.5–5.0)
ALT: 11 U/L — AB (ref 14–54)
AST: 17 U/L (ref 15–41)
Alkaline Phosphatase: 88 U/L (ref 38–126)
Anion gap: 9 (ref 5–15)
BUN: 9 mg/dL (ref 6–20)
CHLORIDE: 123 mmol/L — AB (ref 101–111)
CO2: 8 mmol/L — AB (ref 22–32)
Calcium: 8 mg/dL — ABNORMAL LOW (ref 8.9–10.3)
Creatinine, Ser: 1.46 mg/dL — ABNORMAL HIGH (ref 0.44–1.00)
GFR calc Af Amer: 41 mL/min — ABNORMAL LOW (ref >60)
GFR calc non Af Amer: 36 mL/min — ABNORMAL LOW (ref >60)
Glucose, Bld: 83 mg/dL (ref 65–99)
POTASSIUM: 3.1 mmol/L — AB (ref 3.5–5.1)
SODIUM: 140 mmol/L (ref 135–145)
Total Bilirubin: 0.6 mg/dL (ref 0.3–1.2)
Total Protein: 7.9 g/dL (ref 6.5–8.1)

## 2017-04-29 LAB — CULTURE, ROUTINE-GENITAL

## 2017-04-29 LAB — CBC
HCT: 34 % — ABNORMAL LOW (ref 36.0–46.0)
Hemoglobin: 11.1 g/dL — ABNORMAL LOW (ref 12.0–15.0)
MCH: 25.5 pg — ABNORMAL LOW (ref 26.0–34.0)
MCHC: 32.6 g/dL (ref 30.0–36.0)
MCV: 78 fL (ref 78.0–100.0)
PLATELETS: 344 10*3/uL (ref 150–400)
RBC: 4.36 MIL/uL (ref 3.87–5.11)
RDW: 18.6 % — ABNORMAL HIGH (ref 11.5–15.5)
WBC: 14 10*3/uL — AB (ref 4.0–10.5)

## 2017-04-29 LAB — MAGNESIUM: Magnesium: 2 mg/dL (ref 1.7–2.4)

## 2017-04-29 MED ORDER — MEGESTROL ACETATE 40 MG/ML PO SUSP: ORAL | 2 refills | Status: DC

## 2017-04-29 MED ORDER — MAGNESIUM OXIDE 400 (241.3 MG) MG PO TABS: 400.0000 mg | ORAL_TABLET | Freq: Two times a day (BID) | ORAL | Status: DC

## 2017-04-29 MED ORDER — POTASSIUM CHLORIDE CRYS ER 20 MEQ PO TBCR: EXTENDED_RELEASE_TABLET | ORAL | 1 refills | Status: DC

## 2017-04-29 MED ORDER — TRAMADOL HCL 50 MG PO TABS
50.0000 mg | ORAL_TABLET | Freq: Four times a day (QID) | ORAL | Status: DC | PRN
  Administered 2017-04-29: 50 mg via ORAL
  Filled 2017-04-29: qty 1

## 2017-04-29 MED ORDER — FLUCONAZOLE 150 MG PO TABS: 150.0000 mg | ORAL_TABLET | Freq: Every day | ORAL | 0 refills | Status: AC

## 2017-04-29 MED ORDER — POTASSIUM CHLORIDE CRYS ER 20 MEQ PO TBCR
60.0000 meq | EXTENDED_RELEASE_TABLET | Freq: Four times a day (QID) | ORAL | Status: DC
  Administered 2017-04-29: 60 meq via ORAL
  Filled 2017-04-29: qty 3

## 2017-04-29 MED ORDER — TRAMADOL HCL 50 MG PO TABS: 50.0000 mg | ORAL_TABLET | Freq: Four times a day (QID) | ORAL | 0 refills | Status: DC | PRN

## 2017-04-29 MED ORDER — POTASSIUM CHLORIDE CRYS ER 20 MEQ PO TBCR: EXTENDED_RELEASE_TABLET | ORAL | 0 refills | Status: DC

## 2017-04-29 MED ORDER — POTASSIUM CHLORIDE CRYS ER 20 MEQ PO TBCR: 20.0000 meq | EXTENDED_RELEASE_TABLET | Freq: Three times a day (TID) | ORAL | 0 refills | Status: DC

## 2017-04-29 MED ORDER — GERHARDT'S BUTT CREAM: TOPICAL_CREAM | CUTANEOUS | Status: DC

## 2017-04-29 MED ORDER — FLUCONAZOLE 150 MG PO TABS: 150.0000 mg | ORAL_TABLET | Freq: Every day | ORAL | 0 refills | Status: DC

## 2017-04-29 MED ORDER — INFLIXIMAB 100 MG IV SOLR: INTRAVENOUS | Status: DC

## 2017-04-29 MED ORDER — MAGNESIUM OXIDE 400 (241.3 MG) MG PO TABS
400.0000 mg | ORAL_TABLET | Freq: Two times a day (BID) | ORAL | 0 refills | Status: DC
Start: 2017-04-29 — End: 2017-04-29

## 2017-04-29 MED ORDER — MAGNESIUM OXIDE 400 (241.3 MG) MG PO TABS: 400.0000 mg | ORAL_TABLET | Freq: Two times a day (BID) | ORAL | 0 refills | Status: DC

## 2017-04-29 MED ORDER — SACCHAROMYCES BOULARDII 250 MG PO CAPS: ORAL_CAPSULE | ORAL | Status: DC

## 2017-04-29 NOTE — Progress Notes (Signed)
[facility] PROGRESS NOTE  Danielle [name] EXH:[id number] DOB: 31-Mar-1946 DOA: 04/21/2017   PCP: Iona Beard, MD     Assessment/Plan: Principal Problem:   Tachycardia Active Problems:   Leukocytosis   Hypomagnesemia   Crohn's disease with fistula (HCC)   CKD (chronic kidney disease) stage 3, GFR 30-59 ml/min   Immunosuppressed status (HCC)   Perirectal abscess   Left buttock abscess   Perineal irritation in female   Mucosal irritation of oral cavity   Hypokalemia   Chest pain   Facial swelling   Right facial numbness    71 y.o. female who presented to [facility] for admission to the general surgery service for treatment of a perirectal abscess. Patient underwent operative management on 04/21/2017. Patient's hospitalization has been complicated by chest pain and facial numbness. Cardiology and neurology of both been consult at on this patient and both services have signed off as there is no evidence of acute problems requiring further management. Hospital services were consulted for general medical review of patient's condition and for recommendations.  [facility] managing     Hypokalemia, dehydration, thrush, perineal irritation/drainage.   Assessment and plan #1. Sinus  Tachycardia. Likely related to decreased oral intake, dehydration , deconditioning  She was evaluated over the weekend by cardiology for atypical chest pain who opined pain was pleuritic Tachycardia improved with fluids. Current EKG no acute changes. Currently chest pain-free. Continue to hold Lasix continue telemetry    #2. Chronic kidney disease stage III. Current creatinine 1.6. This appears to be close to her baseline. -Monitor urine output -Gentle IV fluids -Hold nephrotoxins    #3. Hypokalemia. Related to decreased oral intake. Potassium level 3.4>3.1 Continue to replete aggressively, would discharge with potassium 60 mg a day, magnesium oxide 400 mg twice a day Hold Lasix Close  outpatient monitoring of electrolytes   4. Thrush  Etiology uncertain. Improving today. Concern for thrush. Pain with swallowing. Airway patent. Improving after receiving Diflucan Continue Diflucan 150 mg daily 5 days   #5.leukocytosis. White count appears to be chronically elevated since February 2018. Not on steroids. Afebrile, non-toxic appearing. WBC Trending down. Currently close to what appears to be baseline. Surgical site clean and dry. Has been on rocephin.   urinalysis pending Needs outpatient hematology evaluation if chronically elevated, check differential    #6. Perineal irritation. Etiology uncertain. Patient denies any vaginal discharge. Cultures sent yesterday. No burning or pain except with urination. -follow culture -OP follow up  #7. Perirectal abscess status post I and D by surgery. -Defer management to Gen. Surgery, leukocytosis improving  #8. Crohn's disease with history of fistula. Appears stable at baseline. Home medication includes Remicade and bentlyl -Continue home meds  #9 anemia of chronic disease Hemoglobin stable   DVT prophylaxsis Lovenox  Code Status:  Full code    Family Communication: Discussed in detail with the patient, all imaging results, lab results explained to the patient   Disposition Plan:  Patient would be stable for discharge from a medical standpoint today Would discharge on medications as outlined above      Consultants:  [facility]        Antibiotics: Anti-infectives    Start     Dose/Rate Route Frequency Ordered Stop   04/27/17 1100  metroNIDAZOLE (FLAGYL) tablet 500 mg  Status:  Discontinued     500 mg Oral Every 8 hours 04/27/17 1041 04/27/17 1703   04/27/17 1000  fluconazole (DIFLUCAN) tablet 150 mg  150 mg Oral Daily 04/27/17 0859     04/26/17 1400  metroNIDAZOLE (FLAGYL) tablet 500 mg  Status:  Discontinued     500 mg Oral Every 8 hours 04/26/17 1018 04/27/17 0901   04/26/17 1000  fluconazole (DIFLUCAN)  tablet 100 mg  Status:  Discontinued     100 mg Oral Daily 04/25/17 1531 04/27/17 0918   04/25/17 1630  fluconazole (DIFLUCAN) IVPB 200 mg     200 mg 100 mL/hr over 60 Minutes Intravenous  Once 04/25/17 1531 04/25/17 1713   04/21/17 1245  cefTRIAXone (ROCEPHIN) 2 g in dextrose 5 % 50 mL IVPB  Status:  Discontinued    Comments:  Pharmacy may adjust dosing strength / duration / interval for maximal efficacy   2 g 100 mL/hr over 30 Minutes Intravenous Daily 04/21/17 1226 04/27/17 1703   04/21/17 1230  metroNIDAZOLE (FLAGYL) IVPB 500 mg  Status:  Discontinued     500 mg 100 mL/hr over 60 Minutes Intravenous Every 6 hours 04/21/17 1226 04/26/17 1018         HPI/Subjective: Pain uncontrolled , she is hyperventilating , but denies sob, says she is nauseous and in a lot of pain  Objective: Vitals:   04/28/17 0600 04/28/17 1547 04/28/17 2211 04/29/17 0450  BP: 109/66 114/61 (!) 98/53 110/68  Pulse: 93 90 99 (!) 101  Resp: 20 20 18 18   Temp: 98.2 F (36.8 C) 97.8 F (36.6 C) 98.2 F (36.8 C) 98.5 F (36.9 C)  TempSrc:  Oral    SpO2: 100%  100% 100%  Weight:      Height:        Intake/Output Summary (Last 24 hours) at 04/29/17 0920 Last data filed at 04/28/17 2335  Gross per 24 hour  Intake              340 ml  Output              700 ml  Net             -360 ml    Exam:  Examination:  General exam: Appears calm and comfortable  Respiratory system: Clear to auscultation. Respiratory effort normal. Cardiovascular system: S1 & S2 heard, RRR. No JVD, murmurs, rubs, gallops or clicks. No pedal edema. Gastrointestinal system: Abdomen is nondistended, soft and nontender. No organomegaly or masses felt. Normal bowel sounds heard. Central nervous system: Alert and oriented. No focal neurological deficits. Extremities: Symmetric 5 x 5 power. Skin: No rashes, lesions or ulcers Psychiatry: Judgement and insight appear normal. Mood & affect appropriate.     Data Reviewed: I have  personally reviewed following labs and imaging studies  Micro Results Recent Results (from the past 240 hour(s))  Culture, routine-genital     Status: None (Preliminary result)   Collection Time: 04/26/17 12:46 PM  Result Value Ref Range Status   Specimen Description VAGINA  Final   Special Requests Immunocompromised  Final   Culture MODERATE YEAST  Final   Report Status PENDING  Incomplete  C difficile quick scan w PCR reflex     Status: None   Collection Time: 04/28/17  9:43 AM  Result Value Ref Range Status   C Diff antigen NEGATIVE NEGATIVE Final   C Diff toxin NEGATIVE NEGATIVE Final   C Diff interpretation No C. difficile detected.  Final    Radiology Reports Dg Chest 2 View  Result Date: 04/23/2017 CLINICAL DATA:  Chest pain EXAM: CHEST  2 VIEW COMPARISON:  02/03/2017, 10/16/2016,  10/06/2016 FINDINGS: Large hiatal hernia containing dilated bowel, increased compared to 10/16/2016, similar compared with 09/18/2016. No pleural effusion or focal consolidation. Stable cardiomediastinal silhouette. No pneumothorax. Surgical clips in the right upper quadrant. IMPRESSION: No focal consolidation or effusion. Large hiatal hernia containing air filled dilated bowel. Electronically Signed   By: Donavan Foil M.D.   On: 04/23/2017 17:40   Ct Head Wo Contrast  Result Date: 04/25/2017 CLINICAL DATA:  Right facial numbness EXAM: CT HEAD WITHOUT CONTRAST TECHNIQUE: Contiguous axial images were obtained from the base of the skull through the vertex without intravenous contrast. COMPARISON:  Head CT 08/31/2011 FINDINGS: Brain: No mass lesion, intraparenchymal hemorrhage or extra-axial collection. No evidence of acute cortical infarct. 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 Brain parenchyma is otherwise normal for age. Vascular: No hyperdense vessel or unexpected calcification. Skull: Normal visualized skull base, calvarium and extracranial soft tissues. Sinuses/Orbits: No sinus  fluid levels or advanced mucosal thickening. No mastoid effusion. Normal orbits. IMPRESSION: Unchanged left internal capsule lacunar infarct compared to 08/31/2011. No acute intracranial abnormality. Electronically Signed   By: Ulyses Jarred M.D.   On: 04/25/2017 03:43   Mr Jodene Nam Head Wo Contrast  Result Date: 04/25/2017 CLINICAL DATA:  Patient hospitalized 4 days ago for I & D of perirectal abscess. New onset of right-sided facial numbness and tingling beginning at 2:40 a.m. today. Possible right facial droop. The examination had to be discontinued prior to completion due to patient refusal for further imaging. Diffusion-weighted imaging, sagittal T1 weighted sequences, axial gradient sequences, and MRA time-of-flight sequences were performed. EXAM: MRI HEAD WITHOUT CONTRAST MRA HEAD WITHOUT CONTRAST TECHNIQUE: Multiplanar, multiecho pulse sequences of the brain and surrounding structures were obtained without intravenous contrast. Angiographic images of the head were obtained using MRA technique without contrast. COMPARISON:  CT head without contrast from the same day. FINDINGS: MRI HEAD FINDINGS Brain: A remote lacunar infarct is again noted in the anterior limb of the left internal capsule. The diffusion-weighted images demonstrate no acute or subacute infarction. Additional sequences were not performed. Vascular: Flow is present in the major intracranial arteries. Skull and upper cervical spine: The craniocervical junction is normal. Midline sagittal structures are unremarkable. Sinuses/Orbits: The paranasal sinuses and mastoid air cells are clear. The globes and orbits are unremarkable. MRA HEAD FINDINGS The internal carotid artery is are within normal limits the high cervical segments through the ICA termini bilaterally. The A1 and M1 segments are normal. Anterior communicating artery is patent. ACA can't MCA branch vessels are within normal limits. The vertebral arteries are codominant. The right PICA  origin is visualized and normal. The left PICA is not visualized. The left AICA is dominant. Basilar artery is normal. Both posterior cerebral arteries originate basilar tip. PCA branch vessels are within normal limits. IMPRESSION: 1. No acute intracranial abnormality. The study was discontinued early as the patient refused to complete the exam. 2. 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 3. Normal variant MRA circle of Willis without significant proximal stenosis, aneurysm, or branch vessel occlusion. Electronically Signed   By: San Morelle M.D.   On: 04/25/2017 11:13   Mr Brain Wo Contrast  Result Date: 04/25/2017 CLINICAL DATA:  Patient hospitalized 4 days ago for I & D of perirectal abscess. New onset of right-sided facial numbness and tingling beginning at 2:40 a.m. today. Possible right facial droop. The examination had to be discontinued prior to completion due to patient refusal for further imaging. Diffusion-weighted imaging, sagittal T1  weighted sequences, axial gradient sequences, and MRA time-of-flight sequences were performed. EXAM: MRI HEAD WITHOUT CONTRAST MRA HEAD WITHOUT CONTRAST TECHNIQUE: Multiplanar, multiecho pulse sequences of the brain and surrounding structures were obtained without intravenous contrast. Angiographic images of the head were obtained using MRA technique without contrast. COMPARISON:  CT head without contrast from the same day. FINDINGS: MRI HEAD FINDINGS Brain: A remote lacunar infarct is again noted in the anterior limb of the left internal capsule. The diffusion-weighted images demonstrate no acute or subacute infarction. Additional sequences were not performed. Vascular: Flow is present in the major intracranial arteries. Skull and upper cervical spine: The craniocervical junction is normal. Midline sagittal structures are unremarkable. Sinuses/Orbits: The paranasal sinuses and mastoid air cells are clear. The globes and orbits are  unremarkable. MRA HEAD FINDINGS The internal carotid artery is are within normal limits the high cervical segments through the ICA termini bilaterally. The A1 and M1 segments are normal. Anterior communicating artery is patent. ACA can't MCA branch vessels are within normal limits. The vertebral arteries are codominant. The right PICA origin is visualized and normal. The left PICA is not visualized. The left AICA is dominant. Basilar artery is normal. Both posterior cerebral arteries originate basilar tip. PCA branch vessels are within normal limits. IMPRESSION: 1. No acute intracranial abnormality. The study was discontinued early as the patient refused to complete the exam. 2. 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 3. Normal variant MRA circle of Willis without significant proximal stenosis, aneurysm, or branch vessel occlusion. Electronically Signed   By: San Morelle M.D.   On: 04/25/2017 11:13   Ct Maxillofacial W Contrast  Result Date: 04/26/2017 CLINICAL DATA:  Sudden facial swelling. EXAM: CT MAXILLOFACIAL WITH CONTRAST TECHNIQUE: Multidetector CT imaging of the maxillofacial structures was performed. Multiplanar CT image reconstructions were also generated. A small metallic BB was placed on the right temple in order to reliably differentiate right from left. COMPARISON:  MRI brain 04/25/2017. FINDINGS: Osseous: No fracture or mandibular dislocation. No destructive process. Orbits: Negative. No traumatic or inflammatory finding. Sinuses: Mild mucosal thickening in the maxillary and ethmoid sinuses. No significant opacity or layering fluid. Soft tissues: No evidence for significant cellulitis or subcutaneous edema or air. Limited intracranial: Unremarkable. IMPRESSION: No evidence for significant facial cellulitis, subcutaneous edema, tear, or other significant process. No underlying osseous abnormality. Electronically Signed   By: Staci Righter M.D.   On: 04/26/2017 17:25      CBC  Recent Labs Lab 04/24/17 0415 04/25/17 0321 04/27/17 0919 04/28/17 0349 04/29/17 0525  WBC 21.7* 24.2* 24.3* 16.3* 14.0*  HGB 9.9* 10.3* 10.5* 9.8* 11.1*  HCT 29.8* 31.2* 31.8* 30.1* 34.0*  PLT 265 270 268 295 344  MCV 77.6* 77.8* 78.1 77.8* 78.0  MCH 25.8* 25.7* 25.8* 25.3* 25.5*  MCHC 33.2 33.0 33.0 32.6 32.6  RDW 17.8* 17.9* 18.4* 18.4* 18.6*    Chemistries   Recent Labs Lab 04/24/17 0415 04/25/17 0321 04/27/17 0919 04/28/17 0349 04/29/17 0525  NA 141 142 139 140 140  K 3.0* 2.9* 3.1* 3.4* 3.1*  CL 124* 123* 121* 122* 123*  CO2 11* 8* 7* 7* 8*  GLUCOSE 93 106* 95 79 83  BUN 5* <5* 10 10 9   CREATININE 1.44* 1.59* 1.61* 1.42* 1.46*  CALCIUM 6.8* 6.9* 7.1* 7.6* 8.0*  MG  --   --  1.2* 2.7*  --   AST  --   --   --   --  17  ALT  --   --   --   --  11*  ALKPHOS  --   --   --   --  88  BILITOT  --   --   --   --  0.6   ------------------------------------------------------------------------------------------------------------------ estimated creatinine clearance is 31.4 mL/min (A) (by C-G formula based on SCr of 1.46 mg/dL (H)). ------------------------------------------------------------------------------------------------------------------ No results for input(s): HGBA1C in the last 72 hours. ------------------------------------------------------------------------------------------------------------------ No results for input(s): CHOL, HDL, LDLCALC, TRIG, CHOLHDL, LDLDIRECT in the last 72 hours. ------------------------------------------------------------------------------------------------------------------ No results for input(s): TSH, T4TOTAL, T3FREE, THYROIDAB in the last 72 hours.  Invalid input(s): FREET3 ------------------------------------------------------------------------------------------------------------------ No results for input(s): VITAMINB12, FOLATE, FERRITIN, TIBC, IRON, RETICCTPCT in the last 72 hours.  Coagulation profile No  results for input(s): INR, PROTIME in the last 168 hours.  No results for input(s): DDIMER in the last 72 hours.  Cardiac Enzymes  Recent Labs Lab 04/23/17 1043 04/23/17 1454 04/23/17 1953  TROPONINI 0.06* 0.03* 0.05*   ------------------------------------------------------------------------------------------------------------------ Invalid input(s): POCBNP   CBG: No results for input(s): GLUCAP in the last 168 hours.     Studies: No results found.    Lab Results  Component Value Date   HGBA1C 5.4 04/25/2017   HGBA1C 5.6 01/26/2015   HGBA1C  12/17/2009    5.7 (NOTE) The ADA recommends the following therapeutic goal for glycemic control related to Hgb A1c measurement: Goal of therapy: <6.5 Hgb A1c  Reference: American Diabetes Association: Clinical Practice Recommendations 2010, Diabetes Care, 2010, 33: (Suppl  1).   Lab Results  Component Value Date   LDLCALC 24 04/25/2017   CREATININE 1.46 (H) 04/29/2017       Scheduled Meds: . acetaminophen (TYLENOL) oral liquid 160 mg/5 mL  500 mg Oral Q6H  . aspirin EC  81 mg Oral Daily  . dicyclomine  20 mg Oral TID AC  . enoxaparin (LOVENOX) injection  30 mg Subcutaneous Daily  . feeding supplement  1 Container Oral TID BM  . fluconazole  150 mg Oral Daily  . Gerhardt's butt cream   Topical BID  . magic mouthwash w/lidocaine  15 mL Oral TID  . megestrol  200 mg Oral Daily  . multivitamin with minerals  1 tablet Oral Daily  . potassium chloride  60 mEq Oral Q6H  . saccharomyces boulardii  250 mg Oral BID   Continuous Infusions: . sodium chloride 10 mL/hr at 04/28/17 2319     LOS: 7 days    Time spent: >30 MINS    Reyne Dumas  [facility] Pager [phone]. If 7PM-7AM, please contact night-coverage at [email or web address], password [facility] 04/29/2017, 9:20 AM  LOS: 7 days

## 2017-04-29 NOTE — Progress Notes (Signed)
Physical Therapy Discharge Patient Details Name: Danielle [name] MRN: [id number] DOB: 1946-02-10 Today's Date: 04/29/2017 Time:  -     Patient discharged from PT services secondary to patient has refused 3 (three) consecutive times without medical reason.  Please see latest therapy progress note for current level of functioning and progress toward goals.    Progress and discharge plan discussed with patient and/or caregiver: Patient/Caregiver agrees with plan  GP     Lelon Mast 04/29/2017, 7:54 AM

## 2017-04-29 NOTE — Progress Notes (Signed)
8 Days Post-Op    CC:  Perirectal pain  Subjective: Pt discharged from PT, refused to ambulate.  Still has some vaginal discomfort.  Says she wants to go home today.    Went to do dressing change and she is complaining about her vaginal drainage which has been yellow, vaginal culture on 5/21 showed yeast.  Now drainage is looks very much like stool.   She had a rectovaginal fistula and underwent exam under anesthesia/seton placement, proctoscopy 01/31/15, Dr. Marcello [name].   She has been followed by Dr. Marcello [name], Danielle [name] has been removed 09/2015, her last visit with Dr. Marcello [name] was on 02/23/17. It was Dr. 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  She sent her home on some Flagyl to keep symptoms in check.  CT 01/2017, Showed chronically dilated loops of small bowel.  Dr., Danielle [name] notes further surgery was not indicated at that time and further surgery may make her dependent on  IV nutrition if she loose more small bowel  Colonoscopy on 03/12/17 was pretty normal, some friable areas. Dr. Sueanne [name] rectal site looks fine.    Objective: Vital signs in last 24 hours: Temp:  [97.8 F (36.6 C)-98.5 F (36.9 C)] 98.5 F (36.9 C) (05/24 0450) Pulse Rate:  [90-101] 101 (05/24 0450) Resp:  [18-20] 18 (05/24 0450) BP: (98-114)/(53-68) 110/68 (05/24 0450) SpO2:  [100 %] 100 % (05/24 0450) Last BM Date: 04/28/17 340 PO recorded Voided x 5 stool recorded Afebrile, VSS K+ stil low 3.1 on replacement Creatinine is better and WBC continues to decline C diff is negative  Intake/Output from previous day: 05/23 0701 - 05/24 0700 In: 340 [P.O.:340] Out: 700 [Stool:200] Intake/Output this shift: No intake/output data recorded.  General appearance: alert, cooperative and no distress Resp: clear to auscultation bilaterally GI: soft, non-tender; bowel sounds normal; no masses,  no organomegaly Pelvic: Still has some erythema in perineal/buttocks area.   After looking at the perirectal wound whick was clean and no drainage, I noted drainage from the vagina this AM appears to be loose stool.    Lab Results:   Recent Labs  04/28/17 0349 04/29/17 0525  WBC 16.3* 14.0*  HGB 9.8* 11.1*  HCT 30.1* 34.0*  PLT 295 344    BMET  Recent Labs  04/28/17 0349 04/29/17 0525  NA 140 140  K 3.4* 3.1*  CL 122* 123*  CO2 7* 8*  GLUCOSE 79 83  BUN 10 9  CREATININE 1.42* 1.46*  CALCIUM 7.6* 8.0*   PT/INR No results for input(s): LABPROT, INR in the last 72 hours.   Recent Labs Lab 04/29/17 0525  AST 17  ALT 11*  ALKPHOS 88  BILITOT 0.6  PROT 7.9  ALBUMIN 3.1*     Lipase     Component Value Date/Time   LIPASE 20 02/03/2017 0310     Medications: . acetaminophen (TYLENOL) oral liquid 160 mg/5 mL  500 mg Oral Q6H  . aspirin EC  81 mg Oral Daily  . dicyclomine  20 mg Oral TID AC  . enoxaparin (LOVENOX) injection  30 mg Subcutaneous Daily  . feeding supplement  1 Container Oral TID BM  . fluconazole  150 mg Oral Daily  . Gerhardt's butt cream   Topical BID  . magic mouthwash w/lidocaine  15 mL Oral TID  . megestrol  200 mg Oral Daily  . multivitamin with minerals  1 tablet Oral Daily  . potassium chloride  20 mEq Oral TID  . 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 Marcello [name] Now with new stool like drainage from the vagina.   I&D perirectal abscess 04/21/17, Dr. Ninfa [name] Right facial swelling - ?dental/ENT issue - CT requested awaiting CT CP - resolved Chronic hypokalemia - K+ -3.1 this AM - continuing KCL replacement Tachycardia - stable  Diarrhea Sepsis with aspiration pneumonia - 12/2015 Chronic kidney disease Prior CVA  FEN: Soft diet ID: Ceftriaxone 2 gm 5/16 =>>day 6 -no access Flagyl 2 gm 5/16 =>>converted to PO day 7 - then  discontinued Diflucan 200 mg 5/20 x 1 Diflucan 100 PO 5/21 =>>day 4 today - will continue DVT: Lovenox Consults: Cardiology - atypical chest pain - sign off 5/19 Neurology - facial droop/swelling not a stroke, negative CT - resolved    Plan:  Hold on discharge.  Place her on clears for now,  continue diflucan, I have discussed with Dr. Rosendo [name] and he is going to review.   Reviewed with Dr. Rosendo [name], we planned to send her home on a couple more days of diflucan.  She is now hyperventilating and  asking for IV pain medicine for pain medicine. She says it's primarily anterior vaginal/perineal area.  Dr Danielle [name] is going to check a film and be sure she does not have a respiratory issue.  I talked to the Danielle [name] and she is having frequent drainage.  I think just keeping a barrier cream and keeping area dry is what we will need to work on.  I discussed this with her again and she seems less anxious.    LOS: 7 days    Danielle [name] 04/29/2017 [phone]

## 2017-04-29 NOTE — Progress Notes (Signed)
PT Cancellation Note  Patient Details Name: Danielle [name] MRN: [id number] DOB: June 02, 1946   Cancelled Treatment:     Pt reported she is going home today and refused PT treatment. Pt stated it doesn't help her to have PT and she will do fine at home. Per patient request she is d/c from PT services.   Lelon Mast 04/29/2017, 7:53 AM

## 2017-04-29 NOTE — Progress Notes (Signed)
Patient scheduled today for Remicade infusion, cancelled due to hospitalization.

## 2017-04-29 NOTE — Discharge Instructions (Signed)
Perirectal Abscess An abscess is an infected area that contains a collection of pus. A perirectal abscess is an abscess that is near the opening of the anus or around the rectum. A perirectal abscess can cause a lot of pain, especially during bowel movements. What are the causes? This condition is almost always caused by an infection that starts in an anal gland. What increases the risk? This condition is more likely to develop in:  People with diabetes or inflammatory bowel disease.  People whose body defense system (immune system) is weak.  People who have anal sex.  People who have a sexually transmitted disease (STD).  People who have certain kinds of cancers, such as rectal carcinoma, leukemia, or lymphoma. What are the signs or symptoms? The main symptom of this condition is pain. The pain may be a throbbing pain that gets worse during bowel movements. Other symptoms include:  Fever.  Swelling.  Redness.  Bleeding.  Constipation. How is this diagnosed? The condition is diagnosed with a physical exam. If the abscess is not visible, a health care provider may need to place a finger inside the rectum to find the abscess. Sometimes, imaging tests are done to determine the size and location of the abscess. These tests may include:  An ultrasound.  An MRI.  A CT scan. How is this treated? This condition is usually treated with incision and drainage surgery. Incision and drainage surgery involves making an incision over the abscess to drain the pus. Treatment may also involve antibiotic medicine, pain medicine, stool softeners, or laxatives. Follow these instructions at home:  Take medicines only as directed by your health care provider.  If you were prescribed an antibiotic, finish all of it even if you start to feel better.  To relieve pain, try sitting:  In a warm, shallow bath (sitz bath).  On a heating pad with the setting on low.  On an inflatable donut-shaped  cushion.  Follow any diet instructions as directed by your health care provider.  Keep all follow-up visits as directed by your health care provider. This is important. Contact a health care provider if:  Your abscess is bleeding.  You have pain, swelling, or redness that is getting worse.  You are constipated.  You feel ill.  You have muscle aches or chills.  You have a fever.  Your symptoms return after the abscess has healed. This information is not intended to replace advice given to you by your health care provider. Make sure you discuss any questions you have with your health care provider. Document Released: 11/20/2000 Document Revised: 04/30/2016 Document Reviewed: 10/03/2014 Elsevier Interactive Patient Education  2017 [employer]  Mechanical Wound Debridement Mechanical wound debridement is a treatment to remove dead tissue from a wound. This helps the wound heal. The treatment involves cleaning the wound (irrigation) and using a pad or gauze (dressing) to remove dead tissue and debris from the wound. There are different types of mechanical wound debridement. Depending on the wound, you may need to repeat this procedure or change to another form of debridement as your wound starts to heal. Tell a health care provider about:  Any allergies you have.  All medicines you are taking, including vitamins, herbs, eye drops, creams, and over-the-counter medicines.  Any blood disorders you have.  Any medical conditions you have, including any conditions that:  Cause a significant decrease in blood circulation to the part of the body where the wound is, such as peripheral vascular disease.  Compromise your defense (immune) system or white blood count.  Any surgeries you have had.  Whether you are pregnant or may be pregnant. What are the risks? Generally, this is a safe procedure. However, problems may occur, including:  Infection.  Bleeding.  Damage to healthy  tissue in and around your wound.  Soreness or pain.  Failure of the wound to heal.  Scarring. What happens before the procedure? You may be given antibiotic medicine to help prevent infection. What happens during the procedure?  Your health care provider may apply a numbing medicine (topical anesthetic) to the wound.  Your health care provider will irrigate your wound with a germ-free (sterile), salt-water (saline) solution. This removes debris, bacteria, and dead tissue.  Depending on what type of mechanical wound debridement you are having, your health care provider may do one of the following:  Put a dressing on your wound. You may have dry gauze pad placed into the wound. Your health care provider will remove the gauze after the wound is dry. Any dead tissue and debris that has dried into the gauze will be lifted out of the wound (wet-to-dry debridement).  Use a type of pad (monofilament fiber debridement pad). This pad has a fluffy surface on one side that picks up dead tissue and debris from your wound. Your health care provider wets the pad and wipes it over your wound for several minutes.  Irrigate your wound with a pressurized stream of solution such as saline or water.  Once your health care provider is finished, he or she may apply a light dressing to your wound. The procedure may vary among health care providers and hospitals. What happens after the procedure?  You may receive medicine for pain.  You will continue to receive antibiotic medicine if it was started before your procedure. This information is not intended to replace advice given to you by your health care provider. Make sure you discuss any questions you have with your health care provider. Document Released: 08/14/2015 Document Revised: 04/30/2016 Document Reviewed: 04/03/2015 Elsevier Interactive Patient Education  2017 [place] / Hygiene: Cleaning Perineal Area -  The key is to keep area as  clean and dry as possible.  In the area where there is allot of moisture from the drainage you can wash this area frequently with soap and water, the shower and irrigation of the site as shown below is a good way to do this.  Keep a barrier cream on the area to protect it from moisture that goes with drainage from the vagina.    You can get soap and water into the open site where you just had your perirectal site drained.  Redress and pack perirectal site  after each shower till site has healed.       Use a hand-held shower for rinsing directly. To dry off, toweling may be insufficient. Use hair dryer on cool cycle after leaving shower area. Wait 5-10 minutes before dressing.   The more your up and walking the better for your overall health.   Home Occupational therapy and Physical therapy ordered Copyright  [employer]. All rights reserved.

## 2017-04-30 DIAGNOSIS — Z8673 Personal history of transient ischemic attack (TIA), and cerebral infarction without residual deficits: Secondary | ICD-10-CM | POA: Diagnosis not present

## 2017-04-30 DIAGNOSIS — D72829 Elevated white blood cell count, unspecified: Secondary | ICD-10-CM | POA: Diagnosis not present

## 2017-04-30 DIAGNOSIS — Z48817 Encounter for surgical aftercare following surgery on the skin and subcutaneous tissue: Secondary | ICD-10-CM | POA: Diagnosis not present

## 2017-04-30 DIAGNOSIS — K5090 Crohn's disease, unspecified, without complications: Secondary | ICD-10-CM | POA: Diagnosis not present

## 2017-04-30 DIAGNOSIS — K449 Diaphragmatic hernia without obstruction or gangrene: Secondary | ICD-10-CM | POA: Diagnosis not present

## 2017-04-30 DIAGNOSIS — K611 Rectal abscess: Secondary | ICD-10-CM | POA: Diagnosis not present

## 2017-04-30 DIAGNOSIS — N183 Chronic kidney disease, stage 3 (moderate): Secondary | ICD-10-CM | POA: Diagnosis not present

## 2017-04-30 DIAGNOSIS — K219 Gastro-esophageal reflux disease without esophagitis: Secondary | ICD-10-CM | POA: Diagnosis not present

## 2017-04-30 DIAGNOSIS — L0231 Cutaneous abscess of buttock: Secondary | ICD-10-CM | POA: Diagnosis not present

## 2017-05-03 DIAGNOSIS — Z8673 Personal history of transient ischemic attack (TIA), and cerebral infarction without residual deficits: Secondary | ICD-10-CM | POA: Diagnosis not present

## 2017-05-03 DIAGNOSIS — L0231 Cutaneous abscess of buttock: Secondary | ICD-10-CM | POA: Diagnosis not present

## 2017-05-03 DIAGNOSIS — K5090 Crohn's disease, unspecified, without complications: Secondary | ICD-10-CM | POA: Diagnosis not present

## 2017-05-03 DIAGNOSIS — K611 Rectal abscess: Secondary | ICD-10-CM | POA: Diagnosis not present

## 2017-05-03 DIAGNOSIS — K219 Gastro-esophageal reflux disease without esophagitis: Secondary | ICD-10-CM | POA: Diagnosis not present

## 2017-05-03 DIAGNOSIS — N183 Chronic kidney disease, stage 3 (moderate): Secondary | ICD-10-CM | POA: Diagnosis not present

## 2017-05-03 DIAGNOSIS — K449 Diaphragmatic hernia without obstruction or gangrene: Secondary | ICD-10-CM | POA: Diagnosis not present

## 2017-05-03 DIAGNOSIS — Z48817 Encounter for surgical aftercare following surgery on the skin and subcutaneous tissue: Secondary | ICD-10-CM | POA: Diagnosis not present

## 2017-05-03 DIAGNOSIS — D72829 Elevated white blood cell count, unspecified: Secondary | ICD-10-CM | POA: Diagnosis not present

## 2017-05-04 ENCOUNTER — Ambulatory Visit (INDEPENDENT_AMBULATORY_CARE_PROVIDER_SITE_OTHER): Payer: Commercial Managed Care - HMO | Admitting: Internal Medicine

## 2017-05-04 DIAGNOSIS — K219 Gastro-esophageal reflux disease without esophagitis: Secondary | ICD-10-CM | POA: Diagnosis not present

## 2017-05-04 DIAGNOSIS — K611 Rectal abscess: Secondary | ICD-10-CM | POA: Diagnosis not present

## 2017-05-04 DIAGNOSIS — K449 Diaphragmatic hernia without obstruction or gangrene: Secondary | ICD-10-CM | POA: Diagnosis not present

## 2017-05-04 DIAGNOSIS — K5090 Crohn's disease, unspecified, without complications: Secondary | ICD-10-CM | POA: Diagnosis not present

## 2017-05-04 DIAGNOSIS — Z8673 Personal history of transient ischemic attack (TIA), and cerebral infarction without residual deficits: Secondary | ICD-10-CM | POA: Diagnosis not present

## 2017-05-04 DIAGNOSIS — D72829 Elevated white blood cell count, unspecified: Secondary | ICD-10-CM | POA: Diagnosis not present

## 2017-05-04 DIAGNOSIS — Z48817 Encounter for surgical aftercare following surgery on the skin and subcutaneous tissue: Secondary | ICD-10-CM | POA: Diagnosis not present

## 2017-05-04 DIAGNOSIS — N183 Chronic kidney disease, stage 3 (moderate): Secondary | ICD-10-CM | POA: Diagnosis not present

## 2017-05-04 DIAGNOSIS — L0231 Cutaneous abscess of buttock: Secondary | ICD-10-CM | POA: Diagnosis not present

## 2017-05-06 ENCOUNTER — Emergency Department (HOSPITAL_COMMUNITY): Payer: Medicare HMO

## 2017-05-06 ENCOUNTER — Inpatient Hospital Stay (HOSPITAL_COMMUNITY)
Admission: EM | Admit: 2017-05-06 | Discharge: 2017-05-14 | DRG: 871 | Disposition: A | Discharge status: Home / Self Care | Payer: Medicare HMO | Source: Ambulatory Visit | Attending: Internal Medicine | Admitting: Internal Medicine

## 2017-05-06 ENCOUNTER — Encounter (INDEPENDENT_AMBULATORY_CARE_PROVIDER_SITE_OTHER): Payer: Self-pay | Admitting: Internal Medicine

## 2017-05-06 ENCOUNTER — Encounter (HOSPITAL_COMMUNITY): Payer: Self-pay | Admitting: *Deleted

## 2017-05-06 DIAGNOSIS — N824 Other female intestinal-genital tract fistulae: Secondary | ICD-10-CM | POA: Diagnosis present

## 2017-05-06 DIAGNOSIS — N179 Acute kidney failure, unspecified: Secondary | ICD-10-CM | POA: Diagnosis present

## 2017-05-06 DIAGNOSIS — A419 Sepsis, unspecified organism: Principal | ICD-10-CM | POA: Diagnosis present

## 2017-05-06 DIAGNOSIS — E874 Mixed disorder of acid-base balance: Secondary | ICD-10-CM | POA: Diagnosis not present

## 2017-05-06 DIAGNOSIS — K529 Noninfective gastroenteritis and colitis, unspecified: Secondary | ICD-10-CM

## 2017-05-06 DIAGNOSIS — D631 Anemia in chronic kidney disease: Secondary | ICD-10-CM | POA: Diagnosis present

## 2017-05-06 DIAGNOSIS — Z5329 Procedure and treatment not carried out because of patient's decision for other reasons: Secondary | ICD-10-CM | POA: Diagnosis present

## 2017-05-06 DIAGNOSIS — R197 Diarrhea, unspecified: Secondary | ICD-10-CM | POA: Diagnosis not present

## 2017-05-06 DIAGNOSIS — K449 Diaphragmatic hernia without obstruction or gangrene: Secondary | ICD-10-CM | POA: Diagnosis present

## 2017-05-06 DIAGNOSIS — K50114 Crohn's disease of large intestine with abscess: Secondary | ICD-10-CM | POA: Diagnosis not present

## 2017-05-06 DIAGNOSIS — N390 Urinary tract infection, site not specified: Secondary | ICD-10-CM | POA: Diagnosis present

## 2017-05-06 DIAGNOSIS — K50113 Crohn's disease of large intestine with fistula: Secondary | ICD-10-CM | POA: Diagnosis present

## 2017-05-06 DIAGNOSIS — Z87891 Personal history of nicotine dependence: Secondary | ICD-10-CM

## 2017-05-06 DIAGNOSIS — K611 Rectal abscess: Secondary | ICD-10-CM | POA: Diagnosis present

## 2017-05-06 DIAGNOSIS — N183 Chronic kidney disease, stage 3 (moderate): Secondary | ICD-10-CM | POA: Diagnosis present

## 2017-05-06 DIAGNOSIS — K222 Esophageal obstruction: Secondary | ICD-10-CM | POA: Diagnosis present

## 2017-05-06 DIAGNOSIS — L989 Disorder of the skin and subcutaneous tissue, unspecified: Secondary | ICD-10-CM

## 2017-05-06 DIAGNOSIS — R791 Abnormal coagulation profile: Secondary | ICD-10-CM | POA: Diagnosis present

## 2017-05-06 DIAGNOSIS — R3989 Other symptoms and signs involving the genitourinary system: Secondary | ICD-10-CM

## 2017-05-06 DIAGNOSIS — R0600 Dyspnea, unspecified: Secondary | ICD-10-CM | POA: Diagnosis not present

## 2017-05-06 DIAGNOSIS — Z881 Allergy status to other antibiotic agents status: Secondary | ICD-10-CM

## 2017-05-06 DIAGNOSIS — R634 Abnormal weight loss: Secondary | ICD-10-CM

## 2017-05-06 DIAGNOSIS — R0602 Shortness of breath: Secondary | ICD-10-CM

## 2017-05-06 DIAGNOSIS — R32 Unspecified urinary incontinence: Secondary | ICD-10-CM | POA: Diagnosis present

## 2017-05-06 DIAGNOSIS — J9601 Acute respiratory failure with hypoxia: Secondary | ICD-10-CM | POA: Diagnosis present

## 2017-05-06 DIAGNOSIS — D509 Iron deficiency anemia, unspecified: Secondary | ICD-10-CM | POA: Diagnosis present

## 2017-05-06 DIAGNOSIS — E872 Acidosis: Secondary | ICD-10-CM | POA: Diagnosis present

## 2017-05-06 DIAGNOSIS — Z79899 Other long term (current) drug therapy: Secondary | ICD-10-CM

## 2017-05-06 DIAGNOSIS — J9600 Acute respiratory failure, unspecified whether with hypoxia or hypercapnia: Secondary | ICD-10-CM | POA: Diagnosis present

## 2017-05-06 DIAGNOSIS — I959 Hypotension, unspecified: Secondary | ICD-10-CM | POA: Diagnosis present

## 2017-05-06 DIAGNOSIS — T502X5A Adverse effect of carbonic-anhydrase inhibitors, benzothiadiazides and other diuretics, initial encounter: Secondary | ICD-10-CM | POA: Diagnosis present

## 2017-05-06 DIAGNOSIS — Z6820 Body mass index (BMI) 20.0-20.9, adult: Secondary | ICD-10-CM

## 2017-05-06 DIAGNOSIS — E46 Unspecified protein-calorie malnutrition: Secondary | ICD-10-CM | POA: Diagnosis present

## 2017-05-06 DIAGNOSIS — Z8673 Personal history of transient ischemic attack (TIA), and cerebral infarction without residual deficits: Secondary | ICD-10-CM

## 2017-05-06 DIAGNOSIS — Z882 Allergy status to sulfonamides status: Secondary | ICD-10-CM

## 2017-05-06 DIAGNOSIS — R159 Full incontinence of feces: Secondary | ICD-10-CM | POA: Diagnosis present

## 2017-05-06 DIAGNOSIS — K5090 Crohn's disease, unspecified, without complications: Secondary | ICD-10-CM | POA: Diagnosis present

## 2017-05-06 DIAGNOSIS — E860 Dehydration: Secondary | ICD-10-CM | POA: Diagnosis present

## 2017-05-06 DIAGNOSIS — D899 Disorder involving the immune mechanism, unspecified: Secondary | ICD-10-CM | POA: Diagnosis not present

## 2017-05-06 DIAGNOSIS — Z886 Allergy status to analgesic agent status: Secondary | ICD-10-CM

## 2017-05-06 DIAGNOSIS — N823 Fistula of vagina to large intestine: Secondary | ICD-10-CM

## 2017-05-06 DIAGNOSIS — K50918 Crohn's disease, unspecified, with other complication: Secondary | ICD-10-CM | POA: Diagnosis present

## 2017-05-06 DIAGNOSIS — K219 Gastro-esophageal reflux disease without esophagitis: Secondary | ICD-10-CM | POA: Diagnosis present

## 2017-05-06 DIAGNOSIS — Z79620 Long term (current) use of immunosuppressive biologic: Secondary | ICD-10-CM

## 2017-05-06 DIAGNOSIS — R05 Cough: Secondary | ICD-10-CM | POA: Diagnosis not present

## 2017-05-06 DIAGNOSIS — D72829 Elevated white blood cell count, unspecified: Secondary | ICD-10-CM | POA: Diagnosis present

## 2017-05-06 DIAGNOSIS — K50019 Crohn's disease of small intestine with unspecified complications: Secondary | ICD-10-CM | POA: Diagnosis not present

## 2017-05-06 DIAGNOSIS — K5000 Crohn's disease of small intestine without complications: Secondary | ICD-10-CM | POA: Diagnosis not present

## 2017-05-06 DIAGNOSIS — L409 Psoriasis, unspecified: Secondary | ICD-10-CM | POA: Diagnosis present

## 2017-05-06 DIAGNOSIS — E876 Hypokalemia: Secondary | ICD-10-CM | POA: Diagnosis present

## 2017-05-06 DIAGNOSIS — K6289 Other specified diseases of anus and rectum: Secondary | ICD-10-CM | POA: Diagnosis present

## 2017-05-06 DIAGNOSIS — D849 Immunodeficiency, unspecified: Secondary | ICD-10-CM | POA: Diagnosis present

## 2017-05-06 DIAGNOSIS — K50919 Crohn's disease, unspecified, with unspecified complications: Secondary | ICD-10-CM | POA: Diagnosis not present

## 2017-05-06 DIAGNOSIS — R630 Anorexia: Secondary | ICD-10-CM

## 2017-05-06 DIAGNOSIS — Z9049 Acquired absence of other specified parts of digestive tract: Secondary | ICD-10-CM

## 2017-05-06 LAB — COMPREHENSIVE METABOLIC PANEL
ALBUMIN: 3.3 g/dL — AB (ref 3.5–5.0)
ALT: 33 U/L (ref 14–54)
ANION GAP: 13 (ref 5–15)
AST: 47 U/L — ABNORMAL HIGH (ref 15–41)
Alkaline Phosphatase: 92 U/L (ref 38–126)
BUN: 27 mg/dL — AB (ref 6–20)
CALCIUM: 8.6 mg/dL — AB (ref 8.9–10.3)
CHLORIDE: 115 mmol/L — AB (ref 101–111)
CO2: 9 mmol/L — ABNORMAL LOW (ref 22–32)
CREATININE: 1.84 mg/dL — AB (ref 0.44–1.00)
GFR calc non Af Amer: 27 mL/min — ABNORMAL LOW (ref >60)
GFR, EST AFRICAN AMERICAN: 31 mL/min — AB (ref >60)
GLUCOSE: 94 mg/dL (ref 65–99)
Potassium: 3.4 mmol/L — ABNORMAL LOW (ref 3.5–5.1)
SODIUM: 137 mmol/L (ref 135–145)
TOTAL PROTEIN: 8.2 g/dL — AB (ref 6.5–8.1)
Total Bilirubin: 1 mg/dL (ref 0.3–1.2)

## 2017-05-06 LAB — I-STAT CHEM 8, ED
BUN: 25 mg/dL — AB (ref 6–20)
CHLORIDE: 118 mmol/L — AB (ref 101–111)
CREATININE: 1.7 mg/dL — AB (ref 0.44–1.00)
Calcium, Ion: 1.11 mmol/L — ABNORMAL LOW (ref 1.15–1.40)
GLUCOSE: 84 mg/dL (ref 65–99)
HCT: 33 % — ABNORMAL LOW (ref 36.0–46.0)
HEMOGLOBIN: 11.2 g/dL — AB (ref 12.0–15.0)
POTASSIUM: 2.9 mmol/L — AB (ref 3.5–5.1)
Sodium: 143 mmol/L (ref 135–145)
TCO2: 10 mmol/L (ref 0–100)

## 2017-05-06 LAB — CBC WITH DIFFERENTIAL/PLATELET
BASOS ABS: 0.1 10*3/uL (ref 0.0–0.1)
BASOS PCT: 0 %
EOS ABS: 0.2 10*3/uL (ref 0.0–0.7)
EOS PCT: 1 %
HCT: 35.1 % — ABNORMAL LOW (ref 36.0–46.0)
Hemoglobin: 11.8 g/dL — ABNORMAL LOW (ref 12.0–15.0)
Lymphocytes Relative: 31 %
Lymphs Abs: 5.6 10*3/uL — ABNORMAL HIGH (ref 0.7–4.0)
MCH: 25.9 pg — ABNORMAL LOW (ref 26.0–34.0)
MCHC: 33.6 g/dL (ref 30.0–36.0)
MCV: 77 fL — ABNORMAL LOW (ref 78.0–100.0)
MONO ABS: 1.6 10*3/uL — AB (ref 0.1–1.0)
Monocytes Relative: 9 %
Neutro Abs: 10.9 10*3/uL — ABNORMAL HIGH (ref 1.7–7.7)
Neutrophils Relative %: 59 %
PLATELETS: 451 10*3/uL — AB (ref 150–400)
RBC: 4.56 MIL/uL (ref 3.87–5.11)
RDW: 19 % — AB (ref 11.5–15.5)
WBC: 18.3 10*3/uL — AB (ref 4.0–10.5)

## 2017-05-06 LAB — I-STAT TROPONIN, ED: Troponin i, poc: 0.01 ng/mL (ref 0.00–0.08)

## 2017-05-06 LAB — I-STAT CG4 LACTIC ACID, ED: LACTIC ACID, VENOUS: 1.78 mmol/L (ref 0.5–1.9)

## 2017-05-06 MED ORDER — ALBUTEROL SULFATE (2.5 MG/3ML) 0.083% IN NEBU
2.5000 mg | INHALATION_SOLUTION | Freq: Once | RESPIRATORY_TRACT | Status: AC
  Administered 2017-05-06: 2.5 mg via RESPIRATORY_TRACT
  Filled 2017-05-06: qty 3

## 2017-05-06 MED ORDER — SODIUM CHLORIDE 0.9 % IV BOLUS (SEPSIS)
1000.0000 mL | Freq: Once | INTRAVENOUS | Status: AC
  Administered 2017-05-06: 1000 mL via INTRAVENOUS

## 2017-05-06 MED ORDER — CEFEPIME HCL 2 G IJ SOLR
2.0000 g | INTRAMUSCULAR | Status: AC
  Administered 2017-05-06: 2 g via INTRAVENOUS
  Filled 2017-05-06: qty 2

## 2017-05-06 MED ORDER — IPRATROPIUM-ALBUTEROL 0.5-2.5 (3) MG/3ML IN SOLN: 3.0000 mL | Freq: Once | RESPIRATORY_TRACT | Status: DC

## 2017-05-06 MED ORDER — DEXTROSE 5 % IV SOLN: 2.0000 g | INTRAVENOUS | Status: DC

## 2017-05-06 MED ORDER — VANCOMYCIN HCL IN DEXTROSE 1-5 GM/200ML-% IV SOLN
1000.0000 mg | Freq: Once | INTRAVENOUS | Status: AC
  Administered 2017-05-06: 1000 mg via INTRAVENOUS
  Filled 2017-05-06: qty 200

## 2017-05-06 NOTE — Progress Notes (Addendum)
Pharmacy Antibiotic Note  Danielle [name] is a 71 y.o. female presents with weakness and perirectal abscess admitted on 05/06/2017 with sepsis.  Pharmacy has been consulted for cefepime and vancomycin dosing.  Plan: Cefepime 2 Gm IV x1 then 1 Gm IV q24h Vancomycin 1 Gm x1 then 500 mg IV q24h VT=15-20 mg/L F/u scr/cultures/levels  Height: 5\' 4"  (162.6 cm) Weight: 128 lb (58.1 kg) IBW/kg (Calculated) : 54.7  Temp (24hrs), Avg:97.9 F (36.6 C), Min:97.9 F (36.6 C), Max:97.9 F (36.6 C)  No results for input(s): WBC, CREATININE, LATICACIDVEN, VANCOTROUGH, VANCOPEAK, VANCORANDOM, GENTTROUGH, GENTPEAK, GENTRANDOM, TOBRATROUGH, TOBRAPEAK, TOBRARND, AMIKACINPEAK, AMIKACINTROU, AMIKACIN in the last 168 hours.  Estimated Creatinine Clearance: 31.4 mL/min (A) (by C-G formula based on SCr of 1.46 mg/dL (H)).    Allergies  Allergen Reactions  . Zithromax [Azithromycin] Swelling and Other (See Comments)    Reaction:  Mouth swelling and blisters   . Aspirin Other (See Comments)    Reaction:  Stomach cramping   . Ibuprofen Other (See Comments)    Pt states that she prefers not to take this medication.   . Sulfa Antibiotics Nausea And Vomiting    Antimicrobials this admission: 5/31 cefepime >>   6/1  vancomycin >>   Dose adjustments this admission:   Microbiology results:  BCx:   UCx:    Sputum:    MRSA PCR:   Thank you for allowing pharmacy to be a part of this patient's care.  Dorrene German 05/06/2017 10:26 PM

## 2017-05-06 NOTE — ED Notes (Signed)
Patient transported to X-ray 

## 2017-05-06 NOTE — ED Notes (Signed)
Bed: WA12 Expected date:  Expected time:  Means of arrival:  Comments: EMS-SOB 

## 2017-05-06 NOTE — ED Notes (Signed)
Patient returned from X-ray 

## 2017-05-06 NOTE — ED Triage Notes (Signed)
Pt comes from home via ems, with c/o general weakness since Saturday, no NVD. Pt c/o of SOB.  Pt was recently d/c from [place] on Wednesday ( abscess on buttock). Medical hx of asthma and HTN. Allergies to Asprin, zpack, sulfa drugs. Alert x4. Home address [address], [place]. Family on the way. V/s 109/68, hr 106, rr18, 95 spo2  4 liters. cbg 74.

## 2017-05-07 ENCOUNTER — Inpatient Hospital Stay (HOSPITAL_COMMUNITY): Payer: Medicare HMO

## 2017-05-07 DIAGNOSIS — R197 Diarrhea, unspecified: Secondary | ICD-10-CM | POA: Diagnosis not present

## 2017-05-07 DIAGNOSIS — K50919 Crohn's disease, unspecified, with unspecified complications: Secondary | ICD-10-CM | POA: Diagnosis not present

## 2017-05-07 DIAGNOSIS — I959 Hypotension, unspecified: Secondary | ICD-10-CM | POA: Diagnosis present

## 2017-05-07 DIAGNOSIS — D631 Anemia in chronic kidney disease: Secondary | ICD-10-CM | POA: Diagnosis present

## 2017-05-07 DIAGNOSIS — R3989 Other symptoms and signs involving the genitourinary system: Secondary | ICD-10-CM | POA: Diagnosis not present

## 2017-05-07 DIAGNOSIS — K222 Esophageal obstruction: Secondary | ICD-10-CM | POA: Diagnosis present

## 2017-05-07 DIAGNOSIS — K529 Noninfective gastroenteritis and colitis, unspecified: Secondary | ICD-10-CM | POA: Diagnosis not present

## 2017-05-07 DIAGNOSIS — D509 Iron deficiency anemia, unspecified: Secondary | ICD-10-CM | POA: Diagnosis present

## 2017-05-07 DIAGNOSIS — D72829 Elevated white blood cell count, unspecified: Secondary | ICD-10-CM | POA: Diagnosis not present

## 2017-05-07 DIAGNOSIS — N390 Urinary tract infection, site not specified: Secondary | ICD-10-CM | POA: Diagnosis present

## 2017-05-07 DIAGNOSIS — R159 Full incontinence of feces: Secondary | ICD-10-CM | POA: Diagnosis present

## 2017-05-07 DIAGNOSIS — L409 Psoriasis, unspecified: Secondary | ICD-10-CM | POA: Diagnosis present

## 2017-05-07 DIAGNOSIS — D899 Disorder involving the immune mechanism, unspecified: Secondary | ICD-10-CM | POA: Diagnosis not present

## 2017-05-07 DIAGNOSIS — K449 Diaphragmatic hernia without obstruction or gangrene: Secondary | ICD-10-CM | POA: Diagnosis present

## 2017-05-07 DIAGNOSIS — J9601 Acute respiratory failure with hypoxia: Secondary | ICD-10-CM | POA: Diagnosis present

## 2017-05-07 DIAGNOSIS — L989 Disorder of the skin and subcutaneous tissue, unspecified: Secondary | ICD-10-CM | POA: Diagnosis not present

## 2017-05-07 DIAGNOSIS — E876 Hypokalemia: Secondary | ICD-10-CM | POA: Diagnosis present

## 2017-05-07 DIAGNOSIS — R0602 Shortness of breath: Secondary | ICD-10-CM | POA: Diagnosis not present

## 2017-05-07 DIAGNOSIS — E46 Unspecified protein-calorie malnutrition: Secondary | ICD-10-CM | POA: Diagnosis present

## 2017-05-07 DIAGNOSIS — N183 Chronic kidney disease, stage 3 (moderate): Secondary | ICD-10-CM | POA: Diagnosis present

## 2017-05-07 DIAGNOSIS — K219 Gastro-esophageal reflux disease without esophagitis: Secondary | ICD-10-CM | POA: Diagnosis present

## 2017-05-07 DIAGNOSIS — E872 Acidosis: Secondary | ICD-10-CM | POA: Diagnosis present

## 2017-05-07 DIAGNOSIS — A419 Sepsis, unspecified organism: Secondary | ICD-10-CM | POA: Diagnosis present

## 2017-05-07 DIAGNOSIS — J9600 Acute respiratory failure, unspecified whether with hypoxia or hypercapnia: Secondary | ICD-10-CM | POA: Diagnosis present

## 2017-05-07 DIAGNOSIS — K50918 Crohn's disease, unspecified, with other complication: Secondary | ICD-10-CM | POA: Diagnosis present

## 2017-05-07 DIAGNOSIS — N179 Acute kidney failure, unspecified: Secondary | ICD-10-CM | POA: Diagnosis present

## 2017-05-07 DIAGNOSIS — Z79899 Other long term (current) drug therapy: Secondary | ICD-10-CM | POA: Diagnosis not present

## 2017-05-07 DIAGNOSIS — K611 Rectal abscess: Secondary | ICD-10-CM | POA: Diagnosis present

## 2017-05-07 DIAGNOSIS — R0600 Dyspnea, unspecified: Secondary | ICD-10-CM | POA: Diagnosis not present

## 2017-05-07 DIAGNOSIS — K50113 Crohn's disease of large intestine with fistula: Secondary | ICD-10-CM | POA: Diagnosis present

## 2017-05-07 DIAGNOSIS — E874 Mixed disorder of acid-base balance: Secondary | ICD-10-CM | POA: Diagnosis not present

## 2017-05-07 DIAGNOSIS — D849 Immunodeficiency, unspecified: Secondary | ICD-10-CM | POA: Diagnosis present

## 2017-05-07 DIAGNOSIS — N824 Other female intestinal-genital tract fistulae: Secondary | ICD-10-CM | POA: Diagnosis present

## 2017-05-07 LAB — URINALYSIS, ROUTINE W REFLEX MICROSCOPIC
Glucose, UA: NEGATIVE mg/dL
Ketones, ur: 5 mg/dL — AB
NITRITE: NEGATIVE
PH: 6 (ref 5.0–8.0)
Protein, ur: 100 mg/dL — AB
Specific Gravity, Urine: 1.011 (ref 1.005–1.030)
Squamous Epithelial / LPF: NONE SEEN

## 2017-05-07 LAB — TROPONIN I: Troponin I: <0.03 ng/mL (ref <0.03)

## 2017-05-07 LAB — LACTIC ACID, PLASMA: LACTIC ACID, VENOUS: 2 mmol/L — AB (ref 0.5–1.9)

## 2017-05-07 LAB — BASIC METABOLIC PANEL
Anion gap: 11 (ref 5–15)
BUN: 20 mg/dL (ref 6–20)
CHLORIDE: 121 mmol/L — AB (ref 101–111)
CO2: 9 mmol/L — ABNORMAL LOW (ref 22–32)
Calcium: 7.8 mg/dL — ABNORMAL LOW (ref 8.9–10.3)
Creatinine, Ser: 1.34 mg/dL — ABNORMAL HIGH (ref 0.44–1.00)
GFR calc Af Amer: 46 mL/min — ABNORMAL LOW (ref >60)
GFR calc non Af Amer: 39 mL/min — ABNORMAL LOW (ref >60)
GLUCOSE: 93 mg/dL (ref 65–99)
POTASSIUM: 2.8 mmol/L — AB (ref 3.5–5.1)
Sodium: 141 mmol/L (ref 135–145)

## 2017-05-07 LAB — PROTIME-INR
INR: 1.18
PROTHROMBIN TIME: 15.1 s (ref 11.4–15.2)

## 2017-05-07 LAB — C DIFFICILE QUICK SCREEN W PCR REFLEX
C DIFFICILE (CDIFF) INTERP: NOT DETECTED
C Diff antigen: NEGATIVE
C Diff toxin: NEGATIVE

## 2017-05-07 LAB — CBC
HCT: 35 % — ABNORMAL LOW (ref 36.0–46.0)
Hemoglobin: 11.6 g/dL — ABNORMAL LOW (ref 12.0–15.0)
MCH: 25.8 pg — ABNORMAL LOW (ref 26.0–34.0)
MCHC: 33.1 g/dL (ref 30.0–36.0)
MCV: 78 fL (ref 78.0–100.0)
Platelets: 352 10*3/uL (ref 150–400)
RBC: 4.49 MIL/uL (ref 3.87–5.11)
RDW: 18.9 % — ABNORMAL HIGH (ref 11.5–15.5)
WBC: 17.7 10*3/uL — ABNORMAL HIGH (ref 4.0–10.5)

## 2017-05-07 LAB — PROCALCITONIN: Procalcitonin: 0.73 ng/mL

## 2017-05-07 LAB — D-DIMER, QUANTITATIVE: D-Dimer, Quant: 4.35 ug/mL-FEU — ABNORMAL HIGH (ref 0.00–0.50)

## 2017-05-07 LAB — HEPARIN LEVEL (UNFRACTIONATED): Heparin Unfractionated: 0.94 IU/mL — ABNORMAL HIGH (ref 0.30–0.70)

## 2017-05-07 LAB — GLUCOSE, CAPILLARY
GLUCOSE-CAPILLARY: 56 mg/dL — AB (ref 65–99)
Glucose-Capillary: 89 mg/dL (ref 65–99)
Glucose-Capillary: 90 mg/dL (ref 65–99)
Glucose-Capillary: 74 mg/dL (ref 65–99)

## 2017-05-07 LAB — I-STAT CG4 LACTIC ACID, ED: Lactic Acid, Venous: 1.63 mmol/L (ref 0.5–1.9)

## 2017-05-07 LAB — BRAIN NATRIURETIC PEPTIDE: B Natriuretic Peptide: 49.3 pg/mL (ref 0.0–100.0)

## 2017-05-07 LAB — APTT: APTT: 21 s — AB (ref 24–36)

## 2017-05-07 LAB — MRSA PCR SCREENING: MRSA BY PCR: POSITIVE — AB

## 2017-05-07 MED ORDER — HEPARIN (PORCINE) IN NACL 100-0.45 UNIT/ML-% IJ SOLN: 900.0000 [IU]/h | INTRAMUSCULAR | Status: DC

## 2017-05-07 MED ORDER — MUPIROCIN 2 % EX OINT
1.0000 "application " | TOPICAL_OINTMENT | Freq: Two times a day (BID) | CUTANEOUS | Status: AC
  Administered 2017-05-07 – 2017-05-11 (×10): 1 via NASAL
  Filled 2017-05-07 (×3): qty 22

## 2017-05-07 MED ORDER — ENSURE ENLIVE PO LIQD
237.0000 mL | Freq: Two times a day (BID) | ORAL | Status: DC
  Administered 2017-05-07 – 2017-05-09 (×3): 237 mL via ORAL

## 2017-05-07 MED ORDER — SODIUM CHLORIDE 0.9 % IV BOLUS (SEPSIS)
500.0000 mL | Freq: Once | INTRAVENOUS | Status: AC
  Administered 2017-05-07: 500 mL via INTRAVENOUS

## 2017-05-07 MED ORDER — NYSTATIN 100000 UNIT/GM EX POWD: 1.0000 g | Freq: Two times a day (BID) | CUTANEOUS | Status: DC | PRN

## 2017-05-07 MED ORDER — SACCHAROMYCES BOULARDII 250 MG PO CAPS
250.0000 mg | ORAL_CAPSULE | Freq: Every day | ORAL | Status: DC
  Administered 2017-05-07 – 2017-05-14 (×7): 250 mg via ORAL
  Filled 2017-05-07 (×7): qty 1

## 2017-05-07 MED ORDER — CALCIUM CARBONATE-VITAMIN D 500-200 MG-UNIT PO TABS
1.0000 | ORAL_TABLET | Freq: Every day | ORAL | Status: DC
Start: 2017-05-07 — End: 2017-05-14
  Administered 2017-05-07 – 2017-05-09 (×3): 1 via ORAL
  Filled 2017-05-07 (×7): qty 1

## 2017-05-07 MED ORDER — DICYCLOMINE HCL 10 MG/5ML PO SOLN
20.0000 mg | Freq: Three times a day (TID) | ORAL | Status: DC | PRN
  Administered 2017-05-14: 20 mg via ORAL
  Filled 2017-05-07: qty 10

## 2017-05-07 MED ORDER — ACETAMINOPHEN 325 MG PO TABS
650.0000 mg | ORAL_TABLET | Freq: Four times a day (QID) | ORAL | Status: DC | PRN
  Administered 2017-05-12 – 2017-05-14 (×5): 650 mg via ORAL
  Filled 2017-05-07 (×5): qty 2

## 2017-05-07 MED ORDER — HEPARIN BOLUS VIA INFUSION
2000.0000 [IU] | Freq: Once | INTRAVENOUS | Status: AC
  Administered 2017-05-07: 2000 [IU] via INTRAVENOUS
  Filled 2017-05-07: qty 2000

## 2017-05-07 MED ORDER — METRONIDAZOLE 500 MG PO TABS: 500.0000 mg | ORAL_TABLET | Freq: Once | ORAL | Status: DC

## 2017-05-07 MED ORDER — SODIUM CHLORIDE 0.9% FLUSH
3.0000 mL | Freq: Two times a day (BID) | INTRAVENOUS | Status: DC
  Administered 2017-05-07 – 2017-05-14 (×3): 3 mL via INTRAVENOUS

## 2017-05-07 MED ORDER — DEXTROSE 5 % IV SOLN
1.0000 g | INTRAVENOUS | Status: AC
  Administered 2017-05-07 – 2017-05-12 (×6): 1 g via INTRAVENOUS
  Filled 2017-05-07 (×6): qty 10

## 2017-05-07 MED ORDER — HEPARIN (PORCINE) IN NACL 100-0.45 UNIT/ML-% IJ SOLN
1000.0000 [IU]/h | INTRAMUSCULAR | Status: DC
  Administered 2017-05-07: 1000 [IU]/h via INTRAVENOUS
  Filled 2017-05-07 (×2): qty 250

## 2017-05-07 MED ORDER — CYANOCOBALAMIN 1000 MCG/ML IJ SOLN
1000.0000 ug | INTRAMUSCULAR | Status: DC
  Administered 2017-05-07: 1000 ug via INTRAMUSCULAR
  Filled 2017-05-07: qty 1

## 2017-05-07 MED ORDER — ZOLPIDEM TARTRATE 5 MG PO TABS: 5.0000 mg | ORAL_TABLET | Freq: Every evening | ORAL | Status: DC | PRN

## 2017-05-07 MED ORDER — DEXTROSE 5 % IV SOLN: 1.0000 g | INTRAVENOUS | Status: DC

## 2017-05-07 MED ORDER — POTASSIUM CHLORIDE 10 MEQ/100ML IV SOLN
10.0000 meq | INTRAVENOUS | Status: AC
  Administered 2017-05-07 (×4): 10 meq via INTRAVENOUS
  Filled 2017-05-07 (×4): qty 100

## 2017-05-07 MED ORDER — ALBUTEROL SULFATE (2.5 MG/3ML) 0.083% IN NEBU: 2.5000 mg | INHALATION_SOLUTION | RESPIRATORY_TRACT | Status: DC | PRN

## 2017-05-07 MED ORDER — DIPHENOXYLATE-ATROPINE 2.5-0.025 MG PO TABS
1.0000 | ORAL_TABLET | Freq: Four times a day (QID) | ORAL | Status: DC | PRN
  Filled 2017-05-07: qty 1

## 2017-05-07 MED ORDER — PROMETHAZINE HCL 25 MG PO TABS: 25.0000 mg | ORAL_TABLET | Freq: Two times a day (BID) | ORAL | Status: DC | PRN

## 2017-05-07 MED ORDER — POTASSIUM CHLORIDE CRYS ER 20 MEQ PO TBCR: 40.0000 meq | EXTENDED_RELEASE_TABLET | Freq: Once | ORAL | Status: DC

## 2017-05-07 MED ORDER — PANTOPRAZOLE SODIUM 40 MG PO TBEC: 40.0000 mg | DELAYED_RELEASE_TABLET | Freq: Every day | ORAL | Status: DC

## 2017-05-07 MED ORDER — MAGNESIUM OXIDE 400 (241.3 MG) MG PO TABS: 800.0000 mg | ORAL_TABLET | Freq: Once | ORAL | Status: DC

## 2017-05-07 MED ORDER — ONDANSETRON HCL 4 MG PO TABS: 4.0000 mg | ORAL_TABLET | Freq: Four times a day (QID) | ORAL | Status: DC | PRN

## 2017-05-07 MED ORDER — SODIUM CHLORIDE 0.9 % IV SOLN
INTRAVENOUS | Status: DC
  Administered 2017-05-07 – 2017-05-08 (×3): via INTRAVENOUS

## 2017-05-07 MED ORDER — DEXTROSE 50 % IV SOLN
INTRAVENOUS | Status: AC
  Administered 2017-05-07: 25 mL
  Filled 2017-05-07: qty 50

## 2017-05-07 MED ORDER — TECHNETIUM TO 99M ALBUMIN AGGREGATED
4.2000 | Freq: Once | INTRAVENOUS | Status: AC | PRN
  Administered 2017-05-07: 4.2 via INTRAVENOUS

## 2017-05-07 MED ORDER — ONDANSETRON HCL 4 MG/2ML IJ SOLN: 4.0000 mg | Freq: Four times a day (QID) | INTRAMUSCULAR | Status: DC | PRN

## 2017-05-07 MED ORDER — ENOXAPARIN SODIUM 40 MG/0.4ML ~~LOC~~ SOLN: 40.0000 mg | SUBCUTANEOUS | Status: DC

## 2017-05-07 MED ORDER — POTASSIUM CHLORIDE 10 MEQ/100ML IV SOLN
10.0000 meq | INTRAVENOUS | Status: AC
  Administered 2017-05-07 (×2): 10 meq via INTRAVENOUS
  Filled 2017-05-07 (×2): qty 100

## 2017-05-07 MED ORDER — VANCOMYCIN HCL 500 MG IV SOLR: 500.0000 mg | INTRAVENOUS | Status: DC

## 2017-05-07 MED ORDER — HYDROCODONE-ACETAMINOPHEN 7.5-325 MG PO TABS: 1.0000 | ORAL_TABLET | Freq: Four times a day (QID) | ORAL | Status: DC | PRN

## 2017-05-07 MED ORDER — MAGNESIUM OXIDE 400 (241.3 MG) MG PO TABS
400.0000 mg | ORAL_TABLET | Freq: Two times a day (BID) | ORAL | Status: DC
  Administered 2017-05-07 – 2017-05-09 (×4): 400 mg via ORAL
  Filled 2017-05-07 (×5): qty 1

## 2017-05-07 MED ORDER — CHLORHEXIDINE GLUCONATE CLOTH 2 % EX PADS
6.0000 | MEDICATED_PAD | Freq: Every day | CUTANEOUS | Status: AC
  Administered 2017-05-08 – 2017-05-11 (×4): 6 via TOPICAL

## 2017-05-07 MED ORDER — GERHARDT'S BUTT CREAM: 1.0000 "application " | TOPICAL_CREAM | Freq: Four times a day (QID) | CUTANEOUS | Status: DC | PRN

## 2017-05-07 MED ORDER — ADULT MULTIVITAMIN W/MINERALS CH
1.0000 | ORAL_TABLET | Freq: Every day | ORAL | Status: DC
  Administered 2017-05-07: 1 via ORAL
  Filled 2017-05-07 (×7): qty 1

## 2017-05-07 NOTE — H&P (Addendum)
History and Physical    Danielle [name] WNI:[id number] DOB: 06-Dec-1946 DOA: 05/06/2017  Referring MD/NP/PA:   PCP: Iona Beard, MD   Patient coming from:  The patient is coming from home.  At baseline, pt is independent for most of ADL.   Chief Complaint: SOB, generalized weakness, dysuria, burning on urination  HPI: Danielle [name] is a 71 y.o. female with medical history significant of psoriasis, Crohn's disease (follows with GI, Dr. Bernadene Person, on remicaide infusion), hiatal hernia, GERD, chronic kidney disease-stage III, hx of rectovaginal fistula and recent rectal abscess that was drained recently, who presents with shortness breath, generalized weakness, dysuria and burning on urination.  Patient was recently hospitalized from 05/16-5/24 due to perirectal abscess. She underwent I&D perirectal abscess 04/21/17 by Dr. Ninfa Linden. She states that her abscess issue has improved. She has little clear drainage vaginally and has only mild pain. She states that she has been having SOB in the past three days, which has been progressively getting worse. She speaks in full sentence. Patient denies chest pain, fever or chills. She has mild cough with clear mucus production. She states that because of generalized weakness, she stays in bed most of the time. She denies tenderness in the calf areas. Patient also reports dysuria and burning on urination. She states that she has chronic diarrhea due to chronic disease, which is at her baseline. No nausea, vomiting or abdominal pain. Patient denies unilateral weakness, numbness or tingling in extremities. Patient states that she was on Remicade infusion before last admission, currently she is not taking Remicade  ED Course: pt was found to have hypotension with blood pressure 83/70, which improved to 96/52 with fluid resuscitation in ED. Positive urinalysis with moderate amount of leukocytes, WBC 18.3, lactic acid 1.78, negative troponin, potassium 3.4, worsening  renal function, negative chest x-ray for acute abnormalities, temperature normal, O2 sat 87% on room air, which improved 100% on 2 L nasal cannula oxygen. Pending D-dimer. Patient is admitted to telemetry bed as inpatient.  Review of Systems:   General: no fevers, chills, no changes in body weight, has poor appetite, has fatigue HEENT: no blurry vision, hearing changes or sore throat Respiratory: has dyspnea, coughing, no wheezing CV: no chest pain, no palpitations GI: no nausea, vomiting, abdominal pain, has diarrhea, no constipation GU: has dysuria, burning on urination, no increased urinary frequency, hematuria  Ext: no leg edema Neuro: no unilateral weakness, numbness, or tingling, no vision change or hearing loss Skin: no rash, no skin tear. MSK: No muscle spasm, no deformity, no limitation of range of movement in spin Heme: No easy bruising.  Travel history: No recent long distant travel.  Allergy:  Allergies  Allergen Reactions  . Zithromax [Azithromycin] Swelling and Other (See Comments)    Reaction:  Mouth swelling and blisters   . Aspirin Other (See Comments)    Reaction:  Stomach cramping   . Ibuprofen Other (See Comments)    Pt states that she prefers not to take this medication.   . Sulfa Antibiotics Nausea And Vomiting    Past Medical History:  Diagnosis Date  . Crohn disease ([place])   . Hiatal hernia 12/07/2015  . Psoriasis     Past Surgical History:  Procedure Laterality Date  . ANAL STENOSIS  ~ 2010  . APPENDECTOMY  1980's  . BOWEL RESECTION  2009  . CHOLECYSTECTOMY  1990's  . COLONOSCOPY  2013  . COLONOSCOPY WITH PROPOFOL N/A 03/12/2017   Procedure: COLONOSCOPY WITH PROPOFOL;  Surgeon:  Rogene Houston, MD;  [facility];  Service: Endoscopy;  Laterality: N/A;  2:05-moved up to 1025 per Lelon Frohlich  . COLOSTOMY  2009   DEMASO  . COLOSTOMY TAKEDOWN  2009   "only had it for about 1 month" (08/22/2013)  . ESOPHAGEAL DILATION N/A 03/12/2017   Procedure:  ESOPHAGEAL DILATION;  Surgeon: Rogene Houston, MD;  [facility];  Service: Endoscopy;  Laterality: N/A;  . ESOPHAGOGASTRODUODENOSCOPY  11/11/2011   Procedure: ESOPHAGOGASTRODUODENOSCOPY (EGD);  Surgeon: Missy Sabins, MD;  Location: [facility] ENDOSCOPY;  Service: Endoscopy;  Laterality: N/A;  . ESOPHAGOGASTRODUODENOSCOPY  05/18/2012   Procedure: ESOPHAGOGASTRODUODENOSCOPY (EGD);  Surgeon: Rogene Houston, MD;  [facility];  Service: Endoscopy;  Laterality: N/A;  200  . ESOPHAGOGASTRODUODENOSCOPY (EGD) WITH PROPOFOL N/A 03/12/2017   Procedure: ESOPHAGOGASTRODUODENOSCOPY (EGD) WITH PROPOFOL;  Surgeon: Rogene Houston, MD;  [facility];  Service: Endoscopy;  Laterality: N/A;  . FLEXIBLE SIGMOIDOSCOPY  11/11/2011   Procedure: FLEXIBLE SIGMOIDOSCOPY;  Surgeon: Missy Sabins, MD;  Location: [place];  Service: Endoscopy;  Laterality: N/A;  . goiter  1999  . INCISION AND DRAINAGE PERIRECTAL ABSCESS N/A 04/21/2017   Procedure: IRRIGATION AND DEBRIDEMENT PERIRECTAL ABSCESS;  Surgeon: Coralie Keens, MD;  Location: [place];  Service: General;  Laterality: N/A;  . LEFT HEART CATHETERIZATION WITH CORONARY ANGIOGRAM N/A 07/09/2014   Procedure: LEFT HEART CATHETERIZATION WITH CORONARY ANGIOGRAM;  Surgeon: Peter M Martinique, MD;  Location: [facility] CATH LAB;  Service: Cardiovascular;  Laterality: N/A;  . PROCTOSCOPY N/A 01/31/2015   Procedure: RIGID PROCTOSCOPY;  Surgeon: Leighton Ruff, MD;  Location: [place];  Service: General;  Laterality: N/A;  . RECTAL EXAM UNDER ANESTHESIA N/A 01/31/2015   Procedure: RECTAL EXAM UNDER ANESTHESIA WITH PLACEMENT OF A SETON;  Surgeon: Leighton Ruff, MD;  Location: [place];  Service: General;  Laterality: N/A;    Social History:  reports that she quit smoking about 20 years ago. Her smoking use included Cigarettes. She has a 0.48 pack-year smoking history. She has never used smokeless tobacco. She reports that she does not drink alcohol or use drugs.  Family  History:  Family History  Problem Relation Age of Onset  . Diabetes Mother   . Heart disease Father   . Healthy Sister   . Hypertension Brother   . Colon cancer Brother        died with colon cancer  . Crohn's disease Paternal Aunt      Prior to Admission medications   Medication Sig Start Date End Date Taking? Authorizing Provider  acetaminophen (TYLENOL) 500 MG tablet Take 1,000 mg by mouth every 6 (six) hours as needed for mild pain, moderate pain, fever or headache.    Yes [provider]  albuterol (PROVENTIL HFA;VENTOLIN HFA) 108 (90 Base) MCG/ACT inhaler Inhale 2 puffs into the lungs every 6 (six) hours as needed for wheezing. 12/09/15  Yes Rama, Venetia Maxon, MD  Calcium Carbonate-Vitamin D (CALCIUM 600+D) 600-400 MG-UNIT tablet Take 1 tablet by mouth daily.   Yes [provider]  cyanocobalamin (,VITAMIN B-12,) 1000 MCG/ML injection Inject 1,000 mcg into the muscle every 30 (thirty) days.   Yes [provider]  dicyclomine (BENTYL) 10 MG/5ML syrup Take 20 mg by mouth 3 (three) times daily as needed (for GI upset).   Yes [provider]  diphenoxylate-atropine (LOMOTIL) 2.5-0.025 MG tablet TAKE 1 TABLET BY MOUTH 4 TIMES A DAY AS NEEDED FOR DIARRHEA/LOOSE STOOL 03/31/17  Yes Rehman, [employer]  U, MD  furosemide (LASIX) 40 MG tablet Take 1 tablet (40 mg total) by mouth daily as needed for edema. 06/30/16  Yes Rehman, Mechele Dawley, MD  HYDROcodone-acetaminophen (NORCO) 7.5-325 MG tablet Take 1 tablet by mouth every 12 (twelve) hours as needed for moderate pain.   Yes [provider]  Hydrocortisone (GERHARDT'S BUTT CREAM) CREA Apply 1 application topically 4 (four) times daily as needed for irritation.   Yes [provider]  inFLIXimab (REMICADE) 100 MG injection Inject 100 mg into the vein every 8 (eight) weeks.   Yes [provider]  magnesium oxide (MAG-OX) 400 (241.3 Mg) MG tablet Take 1 tablet (400 mg total) by mouth 2 (two) times  daily. 04/29/17 05/09/17 Yes Earnstine Regal, PA-C  Multiple Vitamin (MULTIVITAMIN WITH MINERALS) TABS Take 1 tablet by mouth daily.   Yes [provider]  nystatin (NYSTATIN) powder Apply 1 g topically 2 (two) times daily as needed (for itching/irritation).   Yes [provider]  nystatin cream (MYCOSTATIN) Apply 1 application topically 2 (two) times daily as needed for dry skin.   Yes [provider]  pantoprazole (PROTONIX) 40 MG tablet TAKE 1 TABLET (40 MG TOTAL) BY MOUTH 2 (TWO) TIMES DAILY BEFORE A MEAL. 05/25/16  Yes Setzer, Terri L, NP  potassium chloride SA (K-DUR,KLOR-CON) 20 MEQ tablet Take 20 mEq by mouth 3 (three) times daily.   Yes [provider]  promethazine (PHENERGAN) 25 MG tablet Take 25 mg by mouth 2 (two) times daily as needed for nausea or vomiting.   Yes [provider]  saccharomyces boulardii (FLORASTOR) 250 MG capsule Take 250 mg by mouth daily.   Yes [provider]  traMADol (ULTRAM) 50 MG tablet Take 1 tablet (50 mg total) by mouth every 6 (six) hours as needed for moderate pain. 04/29/17  Yes Earnstine Regal, PA-C  Vitamin D, Ergocalciferol, (DRISDOL) 50000 units CAPS capsule Take 1 capsule (50,000 Units total) by mouth every 7 (seven) days. Patient taking differently: Take 50,000 Units by mouth every Monday.  07/19/16  Yes Rogene Houston, MD    Physical Exam: Vitals:   05/06/17 2330 05/07/17 0000 05/07/17 0124 05/07/17 0247  BP: 109/70 (!) 96/52 98/60 (!) 101/52  Pulse: 78  89 92  Resp: (!) 23 19 17  (!) 24  Temp:   98.7 F (37.1 C) 97.6 F (36.4 C)  TempSrc:   Oral Oral  SpO2: 100%  99% 100%  Weight:    54.7 kg (120 lb 9.5 oz)  Height:    5' 4.5" (1.638 m)   General: Not in acute distress HEENT:       Eyes: PERRL, EOMI, no scleral icterus.       ENT: No discharge from the ears and nose, no pharynx injection, no tonsillar enlargement.        Neck: No JVD, no bruit, no mass felt. Heme: No neck lymph  node enlargement. Cardiac: S1/S2, RRR, No murmurs, No gallops or rubs. Respiratory: No rales, wheezing, rhonchi or rubs. GI: Soft, nondistended, nontender, no rebound pain, no organomegaly, BS present. GU: No hematuria Ext: No pitting leg edema bilaterally. 2+DP/PT pulse bilaterally. Musculoskeletal: No joint deformities, No joint redness or warmth, no limitation of ROM in spin. Skin: No rashes.  Neuro: Alert, oriented X3, cranial nerves II-XII grossly intact, moves all extremities normally.  Psych: Patient is not psychotic, no suicidal or hemocidal ideation.  Labs on Admission: I have personally reviewed following labs and imaging studies  CBC:  Recent  Labs Lab 05/06/17 2122 05/06/17 2255  WBC 18.3*  --   NEUTROABS 10.9*  --   HGB 11.8* 11.2*  HCT 35.1* 33.0*  MCV 77.0*  --   PLT 451*  --    Basic Metabolic Panel:  Recent Labs Lab 05/06/17 2122 05/06/17 2255  NA 137 143  K 3.4* 2.9*  CL 115* 118*  CO2 9*  --   GLUCOSE 94 84  BUN 27* 25*  CREATININE 1.84* 1.70*  CALCIUM 8.6*  --    GFR: Estimated Creatinine Clearance: 27 mL/min (A) (by C-G formula based on SCr of 1.7 mg/dL (H)). Liver Function Tests:  Recent Labs Lab 05/06/17 2122  AST 47*  ALT 33  ALKPHOS 92  BILITOT 1.0  PROT 8.2*  ALBUMIN 3.3*   No results for input(s): LIPASE, AMYLASE in the last 168 hours. No results for input(s): AMMONIA in the last 168 hours. Coagulation Profile:  Recent Labs Lab 05/06/17 2133  INR 1.18   Cardiac Enzymes:  Recent Labs Lab 05/07/17 0245  TROPONINI <0.03   BNP (last 3 results) No results for input(s): PROBNP in the last 8760 hours. HbA1C: No results for input(s): HGBA1C in the last 72 hours. CBG: No results for input(s): GLUCAP in the last 168 hours. Lipid Profile: No results for input(s): CHOL, HDL, LDLCALC, TRIG, CHOLHDL, LDLDIRECT in the last 72 hours. Thyroid Function Tests: No results for input(s): TSH, T4TOTAL, FREET4, T3FREE, THYROIDAB in the  last 72 hours. Anemia Panel: No results for input(s): VITAMINB12, FOLATE, FERRITIN, TIBC, IRON, RETICCTPCT in the last 72 hours. Urine analysis:    Component Value Date/Time   COLORURINE AMBER (A) 05/06/2017 2342   APPEARANCEUR CLOUDY (A) 05/06/2017 2342   LABSPEC 1.011 05/06/2017 2342   PHURINE 6.0 05/06/2017 2342   GLUCOSEU NEGATIVE 05/06/2017 2342   HGBUR SMALL (A) 05/06/2017 2342   BILIRUBINUR MODERATE (A) 05/06/2017 2342   KETONESUR 5 (A) 05/06/2017 2342   PROTEINUR 100 (A) 05/06/2017 2342   UROBILINOGEN 0.2 07/29/2015 2231   NITRITE NEGATIVE 05/06/2017 2342   LEUKOCYTESUR MODERATE (A) 05/06/2017 2342   Sepsis Labs: @LABRCNTIP (procalcitonin:4,lacticidven:4) ) Recent Results (from the past 240 hour(s))  C difficile quick scan w PCR reflex     Status: None   Collection Time: 04/28/17  9:43 AM  Result Value Ref Range Status   C Diff antigen NEGATIVE NEGATIVE Final   C Diff toxin NEGATIVE NEGATIVE Final   C Diff interpretation No C. difficile detected.  Final     Radiological Exams on Admission: Dg Chest 2 View  Result Date: 05/06/2017 CLINICAL DATA:  71 year old female with generalized weakness for 5 days. Cough. EXAM: CHEST  2 VIEW COMPARISON:  04/29/2017 and earlier. FINDINGS: Semi upright AP and lateral views of the chest. Large chronic gastric containing hiatal hernia. Stable cardiac size and mediastinal contours. Stable lung volumes. No pneumothorax, pulmonary edema, pleural effusion or confluent pulmonary opacity. Chronic right lateral rib fractures. Osteopenia. No acute osseous abnormality identified. Negative visible bowel gas pattern in the abdomen. IMPRESSION: 1.  No acute cardiopulmonary abnormality. 2. Chronic large hiatal hernia with intrathoracic stomach. Electronically Signed   By: Genevie Ann M.D.   On: 05/06/2017 22:00     EKG: Independently reviewed.  Sinus rhythm, QTC 500, LAD, incomplete right bundle blockage  Assessment/Plan Principal Problem:   Acute  respiratory failure with hypoxia (HCC) Active Problems:   Leukocytosis   Acute renal failure superimposed on stage 3 chronic kidney disease ([place])   Immunosuppressed status ([place])  Crohn disease ([place])   Hypokalemia   Hypotension   GERD (gastroesophageal reflux disease)   UTI (urinary tract infection)   Acute respiratory failure with hypoxia ([name]): O2 sat 87% on room air, which improved 100% on 2 L nasal cannula oxygen. Etiology is not clear. Chest x-ray is negative for acute abnormalities. BNP is 49.3, less likely to have CHF. Patient had positive d-dimer 4.35. She has decreased mobility recently, making pulmonary embolism a potential differential diagnosis.  -Will admit to tele bed as inpt. -start IV heparin empirically -Lower extremity venous Doppler  -get VQ scan in morning -trop x 3  Sepsis due to UTI: She has symptoms of UTI and positive UA, consistent with UTI. Patient is a 73 with leukocytosis, hypotension, and tachypnea. Blood pressure responded to IV fluid resuscitation, currently blood pressure is 96/52. Mental status normal. -  Ceftriaxone by IV (pt received one dose of vancomycin and cefepime in ED before urinalysis result came back) - Follow up results of urine and blood cx and amend antibiotic regimen if needed per sensitivity results - will get Procalcitonin and trend lactic acid levels per sepsis protocol. - IVF: 2.5L of NS bolus in ED, followed by 125 cc/h   Hypotension due to sepsis 2/2 UTI -see above  AoCKD-III: Baseline Cre is 1.46 on 04/29/17, pt's Cre is 1.84 on admission. Likely due to prerenal secondary to dehydration and continuation of diuretics. ATN is also possible due to hypotension - IVF as above - Follow up renal function by BMP - Hold Diuretics, lasix  Crohn disease [facility]): Patient states that she has chronic diarrhea, which is at baseline. No nausea, vomiting or abdominal pain. Clinically stable. Patient states that she was on Remicade infusion before  last admission, currently she is not taking Remicade. -hold remicade -continue home bentyl and lomotil  Hypokalemia: K= 3.4 on admission. - Repleted  GERD: -Protonix  DVT ppx: SQ Lovenox Code Status: Full code Family Communication: Yes, patient's sister  at bed side Disposition Plan:  Anticipate discharge back to previous home environment Consults called:  none Admission status:  Inpatient/tele  Date of Service 05/07/2017    Ivor Costa [facility] Pager [phone]  If 7PM-7AM, please contact night-coverage [email or web address] Password [email or web address] 05/07/2017, 3:58 AM

## 2017-05-07 NOTE — Progress Notes (Signed)
*  PRELIMINARY RESULTS* Vascular Ultrasound Lower extremity venous duplex has been completed.  Preliminary findings: No evidence of DVT or baker's cyst.   Landry Mellow, RDMS, RVT  05/07/2017, 10:17 AM

## 2017-05-07 NOTE — Progress Notes (Signed)
[facility]   Patient admitted after midnight see H&P for further details.  Patient is a 71 year old female with medical history significant for Crohn's disease, chronic kidney disease stage III, history of rectovaginal fistula and rectal abscess that was recently drained presented to the emergency department with shortness of breath, generalized weakness, dysuria and burning on urination. Patient was admitted for suspected sepsis with hypoxemia and acute renal failure. She had a positive d-dimer and VQ scan was done which showed low probability for pulmonary embolism. Doppler of the lower extremity was negative for DVT.  Impression: Acute respiratory failure due to sepsis - PE/DVT ruled out  Sepsis from urinary source Acute kidney failure on chronic kidney disease Crohn's disease Hypokalemia GERD  Plan: Discontinue heparin drip Patient continues to be dyspneic will repeat x-ray after hydration If no significant improvement will get chest CT We'll continue Rocephin for now Continue IV fluids Replete potassium, check BMP in the morning Wean oxygen supplementation as tolerated We'll continue to monitor  Chipper Oman, MD

## 2017-05-07 NOTE — Progress Notes (Signed)
ANTICOAGULATION CONSULT NOTE - Initial Consult  Pharmacy Consult for IV heparin Indication: pulmonary embolus (R/o)  Allergies  Allergen Reactions  . Zithromax [Azithromycin] Swelling and Other (See Comments)    Reaction:  Mouth swelling and blisters   . Aspirin Other (See Comments)    Reaction:  Stomach cramping   . Ibuprofen Other (See Comments)    Pt states that she prefers not to take this medication.   . Sulfa Antibiotics Nausea And Vomiting    Patient Measurements: Height: 5\' 4"  (162.6 cm) Weight: 128 lb (58.1 kg) IBW/kg (Calculated) : 54.7 Heparin Dosing Weight: 58.1 kg  Vital Signs: Temp: 98.7 F (37.1 C) (06/01 0124) Temp Source: Oral (06/01 0124) BP: 98/60 (06/01 0124) Pulse Rate: 89 (06/01 0124)  Labs:  Recent Labs  05/06/17 2122 05/06/17 2133 05/06/17 2255  HGB 11.8*  --  11.2*  HCT 35.1*  --  33.0*  PLT 451*  --   --   LABPROT  --  15.1  --   INR  --  1.18  --   CREATININE 1.84*  --  1.70*    Estimated Creatinine Clearance: 27 mL/min (A) (by C-G formula based on SCr of 1.7 mg/dL (H)).   Medical History: Past Medical History:  Diagnosis Date  . Crohn disease ([place])   . Hiatal hernia 12/07/2015  . Psoriasis     Medications:  Scheduled:  . Calcium Carbonate-Vitamin D  1 tablet Oral Daily  . cyanocobalamin  1,000 mcg Intramuscular Q30 days  . heparin  2,000 Units Intravenous Once  . magnesium oxide  400 mg Oral BID  . multivitamin with minerals  1 tablet Oral Daily  . potassium chloride  40 mEq Oral Once  . saccharomyces boulardii  250 mg Oral Daily  . sodium chloride flush  3 mL Intravenous Q12H   Infusions:  . sodium chloride    . cefTRIAXone (ROCEPHIN)  IV    . heparin      Assessment: 59 yoF presented to ED with generalized weakness recently discharged from [facility] on 5/22 with perirectal abscess. Now with D-dimer = 4.35.  IV heparin for r/o PE.  Goal of Therapy:  Heparin level 0.3-0.7 units/ml Monitor platelets by anticoagulation  protocol: Yes   Plan:  Baseline aptt STAT Heparin 2000 unit bolus x1 Start drip at 1000 units/hr Daily CBC/HL Check 1st HL in 8 hours  Lawana Pai R 05/07/2017,1:48 AM

## 2017-05-07 NOTE — Progress Notes (Addendum)
Initial Nutrition Assessment   DOCUMENTATION CODES:   Not applicable  INTERVENTION:    Ensure Enlive po BID, each supplement provides 350 kcal and 20 grams of protein  NUTRITION DIAGNOSIS:   Increased nutrient needs related to acute illness, chronic illness as evidenced by estimated needs  GOAL:   Patient will meet greater than or equal to 90% of their needs  MONITOR:   PO intake, Supplement acceptance, Labs, Weight trends, I & O's, Skin  REASON FOR ASSESSMENT:   Malnutrition Screening Tool  ASSESSMENT:   71 y.o. female with medical history significant of psoriasis, Crohn's disease (follows with GI, Dr. Bernadene Person, on remicaide infusion), hiatal hernia, GERD, chronic kidney disease-stage III, hx of rectovaginal fistula and recent rectal abscess that was drained recently, who presents with shortness breath, generalized weakness, dysuria and burning on urination.  Pt currently in [place].  Recently hospitalized at [facility] due to perirectal abscess; s/p I&D 04/21/17. Medications reviewed and include Mag-Ox, Florastor, MVI and Vitamin B12.  Per wt readings, pt with 18% weight loss since 03/2017.  Severe for time frame. PO intake variable at 25-50% per flowsheet records. Labs reviewed.  POSITIVE for MRSA.  CBG's 56-89.  Unable to complete Nutrition-Focused physical exam at this time.  Suspect malnutrition, however, unable to identify at this time.  Diet Order:  Diet Heart Room service appropriate? Yes; Fluid consistency: Thin  Skin:  Reviewed, no issues  Last BM:  6/1  Height:   Ht Readings from Last 1 Encounters:  05/07/17 5' 4.5" (1.638 m)   Weight:   Wt Readings from Last 1 Encounters:  05/07/17 120 lb 9.5 oz (54.7 kg)   Wt Readings from Last 10 Encounters:  05/07/17 120 lb 9.5 oz (54.7 kg)  04/21/17 128 lb 11.2 oz (58.4 kg)  04/15/17 133 lb 12.8 oz (60.7 kg)  03/18/17 139 lb (63 kg)  03/09/17 146 lb (66.2 kg)  02/18/17 151 lb 14.4 oz (68.9 kg)  01/28/17  151 lb 14.4 oz (68.9 kg)  01/05/17 151 lb 9.6 oz (68.8 kg)  12/09/16 147 lb (66.7 kg)  10/06/16 159 lb 9.6 oz (72.4 kg)   Ideal Body Weight:  54.5 kg  BMI:  Body mass index is 20.38 kg/m.  Estimated Nutritional Needs:   Kcal:  6811-5726  Protein:  85-95 gm  Fluid:  1.7-1.9 L  EDUCATION NEEDS:   No education needs identified at this time  Arthur Holms, RD, LDN Pager #: [phone] After-Hours Pager #: [phone]

## 2017-05-07 NOTE — Progress Notes (Signed)
Attempted to ween to room air. Desat to 80%.  Re-applied O2 at 2 lpm. O2 saturation return to 93%.

## 2017-05-07 NOTE — Progress Notes (Signed)
CRITICAL VALUE ALERT  Critical Value:  Lactic Acid 2.0  Date & Time Notied: 0325  Provider Notified: Opyd  Orders Received/Actions taken: .9NS 569ml bolus

## 2017-05-07 NOTE — Progress Notes (Signed)
ANTICOAGULATION CONSULT NOTE - Follow Up Consult  Pharmacy Consult for heparin Indication: r/o pulmonary embolus  Allergies  Allergen Reactions  . Zithromax [Azithromycin] Swelling and Other (See Comments)    Reaction:  Mouth swelling and blisters   . Aspirin Other (See Comments)    Reaction:  Stomach cramping   . Ibuprofen Other (See Comments)    Pt states that she prefers not to take this medication.   . Sulfa Antibiotics Nausea And Vomiting    Patient Measurements: Height: 5' 4.5" (163.8 cm) Weight: 120 lb 9.5 oz (54.7 kg) IBW/kg (Calculated) : 55.85 Heparin Dosing Weight: 55 kg  Vital Signs: Temp: 97.6 F (36.4 C) (06/01 0247) Temp Source: Oral (06/01 0247) BP: 101/52 (06/01 0247) Pulse Rate: 92 (06/01 0247)  Labs:  Recent Labs  05/06/17 2122 05/06/17 2133 05/06/17 2255 05/07/17 0244 05/07/17 0245 05/07/17 1151  HGB 11.8*  --  11.2*  --   --   --   HCT 35.1*  --  33.0*  --   --   --   PLT 451*  --   --   --   --   --   APTT  --   --   --  21*  --   --   LABPROT  --  15.1  --   --   --   --   INR  --  1.18  --   --   --   --   HEPARINUNFRC  --   --   --   --   --  0.94*  CREATININE 1.84*  --  1.70*  --   --   --   TROPONINI  --   --   --   --  <0.03 <0.03    Estimated Creatinine Clearance: 27 mL/min (A) (by C-G formula based on SCr of 1.7 mg/dL (H)).  Assessment: Patient's a 71 y.o F presented to the ED on 05/06/17 with c/o SOB.  D-dimer was elevated on admission at 4.35.  Heparin drip started for r/o PE.   -6/1 LE doppler: negative for DVT  Today, 05/07/2017: - first heparin level is supratherapeutic at 0.94 - cbc ok - no bleeding documented   Goal of Therapy:  Heparin level 0.3-0.7 units/ml Monitor platelets by anticoagulation protocol: Yes   Plan:  - decrease heparin drip to 900 units/hr - check 8 hr heparin level - monitor for s/s bleeding - f/u V/Q scan results  Adalbert Alberto P 05/07/2017,12:43 PM

## 2017-05-07 NOTE — ED Provider Notes (Addendum)
[facility] DEPT Provider Note   CSN: [id number] Arrival date & time: 05/06/17  1914     History   Chief Complaint Chief Complaint  Patient presents with  . Shortness of Breath  . Generalized Body Aches    HPI Danielle [name] is a 71 y.o. female.  HPI  71 y.o. female with a PMH of Crohn's disease, follows with GI (Dr. Bernadene Person), on remicaide infusion, history of rectovaginal fistula and recent rectal abscess that was drained who comes in with cc of dib. Pt reports that over the past 3 days she has had increased exertional dyspnea. Now ambulating 10  Feet gets her short of breath, typically that is not the case. Pt has non productive cough. Pt has no new abd pain. She has had some diarrhea since she was discharged from the hospital. Pt has no hx of PE, DVT and denies any exogenous hormone (testosterone / estrogen) use, long distance travels or surgery in the past 6 weeks, active cancer, recent immobilization. Pt denies any new leg swelling or orthopnea like symptoms. Pt has had no chest pain.  Past Medical History:  Diagnosis Date  . Crohn disease ([place])   . Hiatal hernia 12/07/2015  . Psoriasis     Patient Active Problem List   Diagnosis Date Noted  . Acute respiratory failure with hypoxia ([place]) 05/07/2017  . Hypotension 05/07/2017  . GERD (gastroesophageal reflux disease) 05/07/2017  . Tachycardia 04/27/2017  . Perineal irritation in female 04/27/2017  . Mucosal irritation of oral cavity 04/27/2017  . Hypokalemia 04/27/2017  . Chest pain   . Facial swelling   . Right facial numbness   . Perirectal abscess 04/21/2017  . Left buttock abscess 04/21/2017  . Crohn disease ([place]) 02/19/2017  . Immunosuppressed status ([place]) 12/09/2015  . 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 Hiatal hernia 12/07/2015  . Sepsis due to pneumonia ([place]) 12/06/2015  . Acute renal failure superimposed on stage 3 chronic kidney disease ([place]) 12/06/2015  . Crohn's disease with fistula ([place])   .  Hypomagnesemia   . ARF (acute renal failure) ([place]) 11/18/2014  . Leukocytosis 11/06/2011    Past Surgical History:  Procedure Laterality Date  . ANAL STENOSIS  ~ 2010  . APPENDECTOMY  1980's  . BOWEL RESECTION  2009  . CHOLECYSTECTOMY  1990's  . COLONOSCOPY  2013  . COLONOSCOPY WITH PROPOFOL N/A 03/12/2017   Procedure: COLONOSCOPY WITH PROPOFOL;  Surgeon: Rogene Houston, MD;  [facility];  Service: Endoscopy;  Laterality: N/A;  2:05-moved up to 1025 per Lelon Frohlich  . COLOSTOMY  2009   DEMASO  . COLOSTOMY TAKEDOWN  2009   "only had it for about 1 month" (08/22/2013)  . ESOPHAGEAL DILATION N/A 03/12/2017   Procedure: ESOPHAGEAL DILATION;  Surgeon: Rogene Houston, MD;  [facility];  Service: Endoscopy;  Laterality: N/A;  . ESOPHAGOGASTRODUODENOSCOPY  11/11/2011   Procedure: ESOPHAGOGASTRODUODENOSCOPY (EGD);  Surgeon: Missy Sabins, MD;  Location: [facility] ENDOSCOPY;  Service: Endoscopy;  Laterality: N/A;  . ESOPHAGOGASTRODUODENOSCOPY  05/18/2012   Procedure: ESOPHAGOGASTRODUODENOSCOPY (EGD);  Surgeon: Rogene Houston, MD;  [facility];  Service: Endoscopy;  Laterality: N/A;  200  . ESOPHAGOGASTRODUODENOSCOPY (EGD) WITH PROPOFOL N/A 03/12/2017   Procedure: ESOPHAGOGASTRODUODENOSCOPY (EGD) WITH PROPOFOL;  Surgeon: Rogene Houston, MD;  [facility];  Service: Endoscopy;  Laterality: N/A;  . FLEXIBLE SIGMOIDOSCOPY  11/11/2011   Procedure: FLEXIBLE SIGMOIDOSCOPY;  Surgeon: Missy Sabins, MD;  Location: [place];  Service: Endoscopy;  Laterality: N/A;  . goiter  1999  . INCISION AND DRAINAGE PERIRECTAL ABSCESS N/A 04/21/2017   Procedure: IRRIGATION AND DEBRIDEMENT PERIRECTAL ABSCESS;  Surgeon: Coralie Keens, MD;  Location: [facility];  Service: General;  Laterality: N/A;  . LEFT HEART CATHETERIZATION WITH CORONARY ANGIOGRAM N/A 07/09/2014   Procedure: LEFT HEART CATHETERIZATION WITH CORONARY ANGIOGRAM;  Surgeon: Peter M Martinique, MD;  Location: [facility] CATH LAB;  Service:  Cardiovascular;  Laterality: N/A;  . PROCTOSCOPY N/A 01/31/2015   Procedure: RIGID PROCTOSCOPY;  Surgeon: Leighton Ruff, MD;  Location: [place];  Service: General;  Laterality: N/A;  . RECTAL EXAM UNDER ANESTHESIA N/A 01/31/2015   Procedure: RECTAL EXAM UNDER ANESTHESIA WITH PLACEMENT OF A SETON;  Surgeon: Leighton Ruff, MD;  Location: [place];  Service: General;  Laterality: N/A;    OB History    No data available       Home Medications    Prior to Admission medications   Medication Sig Start Date End Date Taking? Authorizing Provider  acetaminophen (TYLENOL) 500 MG tablet Take 1,000 mg by mouth every 6 (six) hours as needed for mild pain, moderate pain, fever or headache.    Yes [provider]  albuterol (PROVENTIL HFA;VENTOLIN HFA) 108 (90 Base) MCG/ACT inhaler Inhale 2 puffs into the lungs every 6 (six) hours as needed for wheezing. 12/09/15  Yes Rama, Venetia Maxon, MD  Calcium Carbonate-Vitamin D (CALCIUM 600+D) 600-400 MG-UNIT tablet Take 1 tablet by mouth daily.   Yes [provider]  cyanocobalamin (,VITAMIN B-12,) 1000 MCG/ML injection Inject 1,000 mcg into the muscle every 30 (thirty) days.   Yes [provider]  dicyclomine (BENTYL) 10 MG/5ML syrup Take 20 mg by mouth 3 (three) times daily as needed (for GI upset).   Yes [provider]  diphenoxylate-atropine (LOMOTIL) 2.5-0.025 MG tablet TAKE 1 TABLET BY MOUTH 4 TIMES A DAY AS NEEDED FOR DIARRHEA/LOOSE STOOL 03/31/17  Yes Rehman, Mechele Dawley, MD  furosemide (LASIX) 40 MG tablet Take 1 tablet (40 mg total) by mouth daily as needed for edema. 06/30/16  Yes Rehman, Mechele Dawley, MD  HYDROcodone-acetaminophen (NORCO) 7.5-325 MG tablet Take 1 tablet by mouth every 12 (twelve) hours as needed for moderate pain.   Yes [provider]  Hydrocortisone (GERHARDT'S BUTT CREAM) CREA Apply 1 application topically 4 (four) times daily as needed for irritation.   Yes [provider]  inFLIXimab (REMICADE)  100 MG injection Inject 100 mg into the vein every 8 (eight) weeks.   Yes [provider]  magnesium oxide (MAG-OX) 400 (241.3 Mg) MG tablet Take 1 tablet (400 mg total) by mouth 2 (two) times daily. 04/29/17 05/09/17 Yes Earnstine Regal, PA-C  Multiple Vitamin (MULTIVITAMIN WITH MINERALS) TABS Take 1 tablet by mouth daily.   Yes [provider]  nystatin (NYSTATIN) powder Apply 1 g topically 2 (two) times daily as needed (for itching/irritation).   Yes [provider]  nystatin cream (MYCOSTATIN) Apply 1 application topically 2 (two) times daily as needed for dry skin.   Yes [provider]  pantoprazole (PROTONIX) 40 MG tablet TAKE 1 TABLET (40 MG TOTAL) BY MOUTH 2 (TWO) TIMES DAILY BEFORE A MEAL. 05/25/16  Yes Setzer, Terri L, NP  potassium chloride SA (K-DUR,KLOR-CON) 20 MEQ tablet Take 20 mEq by mouth 3 (three) times daily.   Yes [provider]  promethazine (PHENERGAN) 25 MG tablet Take 25 mg by mouth 2 (two) times daily as needed for nausea or vomiting.   Yes [provider]  saccharomyces boulardii (FLORASTOR) 250 MG capsule Take 250 mg by mouth daily.   Yes [provider]  traMADol (ULTRAM) 50 MG tablet Take 1 tablet (50 mg total) by mouth every 6 (six) hours as needed for moderate pain. 04/29/17  Yes Earnstine Regal, PA-C  Vitamin D, Ergocalciferol, (DRISDOL) 50000 units CAPS capsule Take 1 capsule (50,000 Units total) by mouth every 7 (seven) days. Patient taking differently: Take 50,000 Units by mouth every Monday.  07/19/16  Yes Rehman, Mechele Dawley, MD    Family History Family History  Problem Relation Age of Onset  . Diabetes Mother   . Heart disease Father   . Healthy Sister   . Hypertension Brother   . Colon cancer Brother        died with colon cancer  . Crohn's disease Paternal Aunt     Social History Social History  Substance Use Topics  . Smoking status: Former Smoker    Packs/day: 0.12    Years: 4.00     Types: Cigarettes    Quit date: 10/05/1996  . Smokeless tobacco: Never Used     Comment: 08/22/2013 1 pack every two weeks when she did smoked  . Alcohol use No     Allergies   Zithromax [azithromycin]; Aspirin; Ibuprofen; and Sulfa antibiotics   Review of Systems Review of Systems  Constitutional: Positive for activity change.  Respiratory: Positive for shortness of breath. Negative for wheezing.   Cardiovascular: Negative for chest pain.  Gastrointestinal: Positive for abdominal pain and diarrhea.  Allergic/Immunologic: Positive for immunocompromised state.  All other systems reviewed and are negative.    Physical Exam Updated Vital Signs BP 109/70   Pulse 78   Temp 97.9 F (36.6 C) (Oral)   Resp (!) 23   Ht 5\' 4"  (1.626 m)   Wt 58.1 kg (128 lb)   SpO2 100%   BMI 21.97 kg/m   Physical Exam  Constitutional: She is oriented to person, place, and time. She appears well-developed and well-nourished.  HENT:  Head: Normocephalic and atraumatic.  Eyes: EOM are normal. Pupils are equal, round, and reactive to light.  Neck: Neck supple.  Cardiovascular: Normal rate, regular rhythm and normal heart sounds.   No murmur heard. Pulmonary/Chest: Effort normal. No respiratory distress.  Abdominal: Soft. She exhibits no distension. There is tenderness. There is no rebound and no guarding.  Generalized tenderness  Neurological: She is alert and oriented to person, place, and time.  Skin: Skin is warm and dry.  Nursing note and vitals reviewed.    ED Treatments / Results  Labs (all labs ordered are listed, but only abnormal results are displayed) Labs Reviewed  COMPREHENSIVE METABOLIC PANEL - Abnormal; Notable for the following:       Result Value   Potassium 3.4 (*)    Chloride 115 (*)    CO2 9 (*)    BUN 27 (*)    Creatinine, Ser 1.84 (*)    Calcium 8.6 (*)    Total Protein 8.2 (*)    Albumin 3.3 (*)    AST 47 (*)    GFR calc non Af Amer 27 (*)    GFR calc Af  Amer 31 (*)    All other components within normal limits  CBC WITH DIFFERENTIAL/PLATELET - Abnormal; Notable for the following:    WBC 18.3 (*)    Hemoglobin 11.8 (*)    HCT 35.1 (*)    MCV 77.0 (*)    MCH 25.9 (*)  RDW 19.0 (*)    Platelets 451 (*)    Neutro Abs 10.9 (*)    Lymphs Abs 5.6 (*)    Monocytes Absolute 1.6 (*)    All other components within normal limits  URINALYSIS, ROUTINE W REFLEX MICROSCOPIC - Abnormal; Notable for the following:    Color, Urine AMBER (*)    APPearance CLOUDY (*)    Hgb urine dipstick SMALL (*)    Bilirubin Urine MODERATE (*)    Ketones, ur 5 (*)    Protein, ur 100 (*)    Leukocytes, UA MODERATE (*)    Bacteria, UA MANY (*)    Non Squamous Epithelial 0-5 (*)    All other components within normal limits  I-STAT CHEM 8, ED - Abnormal; Notable for the following:    Potassium 2.9 (*)    Chloride 118 (*)    BUN 25 (*)    Creatinine, Ser 1.70 (*)    Calcium, Ion 1.11 (*)    Hemoglobin 11.2 (*)    HCT 33.0 (*)    All other components within normal limits  CULTURE, BLOOD (ROUTINE X 2)  CULTURE, BLOOD (ROUTINE X 2)  C DIFFICILE QUICK SCREEN W PCR REFLEX  URINE CULTURE  PROTIME-INR  D-DIMER, QUANTITATIVE (NOT AT [facility])  BRAIN NATRIURETIC PEPTIDE  TROPONIN I  TROPONIN I  TROPONIN I  LACTIC ACID, PLASMA  LACTIC ACID, PLASMA  PROCALCITONIN  BASIC METABOLIC PANEL  CBC  I-STAT CG4 LACTIC ACID, ED  I-STAT TROPOININ, ED  I-STAT CG4 LACTIC ACID, ED    EKG  EKG Interpretation  Date/Time:  Thursday May 06 2017 22:48:22 EDT Ventricular Rate:  100 PR Interval:    QRS Duration: 117 QT Interval:  387 QTC Calculation: 500 R Axis:   -67 Text Interpretation:  Sinus tachycardia Atrial premature complex Incomplete RBBB and LAFB Probable left ventricular hypertrophy Anterior Q waves, possibly due to LVH ST elevation, consider inferior injury When compared with ECG of 04/27/2017, No significant change was found Confirmed by Delora Fuel ([id number]) on  05/06/2017 10:57:24 PM       Radiology Dg Chest 2 View  Result Date: 05/06/2017 CLINICAL DATA:  71 year old female with generalized weakness for 5 days. Cough. EXAM: CHEST  2 VIEW COMPARISON:  04/29/2017 and earlier. FINDINGS: Semi upright AP and lateral views of the chest. Large chronic gastric containing hiatal hernia. Stable cardiac size and mediastinal contours. Stable lung volumes. No pneumothorax, pulmonary edema, pleural effusion or confluent pulmonary opacity. Chronic right lateral rib fractures. Osteopenia. No acute osseous abnormality identified. Negative visible bowel gas pattern in the abdomen. IMPRESSION: 1.  No acute cardiopulmonary abnormality. 2. Chronic large hiatal hernia with intrathoracic stomach. Electronically Signed   By: Genevie Ann M.D.   On: 05/06/2017 22:00    Procedures Procedures (including critical care time)   Angiocath insertion Performed by: Varney Biles  Consent: Verbal consent obtained. Risks and benefits: risks, benefits and alternatives were discussed Time out: Immediately prior to procedure a "time out" was called to verify the correct patient, procedure, equipment, support staff and site/side marked as required.  Preparation: Patient was prepped and draped in the usual sterile fashion.  Vein Location: left antecubital fossa  Ultrasound Guided  Gauge: 20  Normal blood return and flush without difficulty Patient tolerance: Patient tolerated the procedure well with no immediate complications.   CRITICAL CARE Performed by: Varney Biles   Total critical care time: 43 minutes for severe sepsis, hypoxia and hypotenstion - multiple fluids, antibiotics and reassessments  Critical care time was exclusive of separately billable procedures and treating other patients.  Critical care was necessary to treat or prevent imminent or life-threatening deterioration.  Critical care was time spent personally by me on the following activities: development  of treatment plan with patient and/or surrogate as well as nursing, discussions with consultants, evaluation of patient's response to treatment, examination of patient, obtaining history from patient or surrogate, ordering and performing treatments and interventions, ordering and review of laboratory studies, ordering and review of radiographic studies, pulse oximetry and re-evaluation of patient's condition.   Medications Ordered in ED Medications  vancomycin (VANCOCIN) IVPB 1000 mg/200 mL premix (1,000 mg Intravenous New Bag/Given 05/06/17 2351)  potassium chloride SA (K-DUR,KLOR-CON) CR tablet 40 mEq (not administered)  ceFEPIme (MAXIPIME) 1 g in dextrose 5 % 50 mL IVPB (not administered)  albuterol (PROVENTIL) (2.5 MG/3ML) 0.083% nebulizer solution 2.5 mg (not administered)  Calcium Carbonate-Vitamin D 600-400 MG-UNIT 1 tablet (not administered)  cyanocobalamin ((VITAMIN B-12)) injection 1,000 mcg (not administered)  dicyclomine (BENTYL) 10 MG/5ML syrup 20 mg (not administered)  HYDROcodone-acetaminophen (NORCO) 7.5-325 MG per tablet 1 tablet (not administered)  Gerhardt's butt cream 1 application (not administered)  nystatin (MYCOSTATIN/NYSTOP) topical powder 1 g (not administered)  saccharomyces boulardii (FLORASTOR) capsule 250 mg (not administered)  promethazine (PHENERGAN) tablet 25 mg (not administered)  magnesium oxide (MAG-OX) tablet 400 mg (not administered)  diphenoxylate-atropine (LOMOTIL) 2.5-0.025 MG per tablet 1 tablet (not administered)  acetaminophen (TYLENOL) tablet 650 mg (not administered)  multivitamin with minerals tablet 1 tablet (not administered)  metroNIDAZOLE (FLAGYL) tablet 500 mg (not administered)  enoxaparin (LOVENOX) injection 40 mg (not administered)  sodium chloride flush (NS) 0.9 % injection 3 mL (not administered)  ondansetron (ZOFRAN) tablet 4 mg (not administered)    Or  ondansetron (ZOFRAN) injection 4 mg (not administered)  zolpidem (AMBIEN) tablet  5 mg (not administered)  albuterol (PROVENTIL) (2.5 MG/3ML) 0.083% nebulizer solution 2.5 mg (2.5 mg Nebulization Given 05/06/17 2019)  sodium chloride 0.9 % bolus 1,000 mL (0 mLs Intravenous Stopped 05/06/17 2255)    And  sodium chloride 0.9 % bolus 1,000 mL (1,000 mLs Intravenous New Bag/Given 05/06/17 2238)  ceFEPIme (MAXIPIME) 2 g in dextrose 5 % 50 mL IVPB (0 g Intravenous Stopped 05/06/17 2310)     Initial Impression / Assessment and Plan / ED Course  I have reviewed the triage vital signs and the nursing notes.  Pertinent labs & imaging results that were available during my care of the patient were reviewed by me and considered in my medical decision making (see chart for details).  Clinical Course as of May 07 34  Fri May 07, 2017  0034 Results from the ER workup discussed with the patient face to face and all questions answered to the best of my ability.  BP has improved. Medicine to admit. Vanc and cef administered.  [AN]    Clinical Course User Index [AN] Varney Biles, MD    Pt comes in with cc of DIB. Based on exam, there is no signs of CHF. Pt was hypoxic at arrival and she is in mild resp distress. She is immunosuppressed, and has a mild cough. CXR ordered, and shows no clear infiltrates or signs of CHF. No PE risk factors beside the admission. We will get dimer. If dimer +, she will need VQ scan. Pt also noted to have low BP when she first arrived. She has some loose BM. She might need cdiff to be checked, as there is  leukocytosis. Sepsis workup has been initiated. Vanc and cefepime given for now. We can add flagyl if anaerobe infection concerns go high.  Final Clinical Impressions(s) / ED Diagnoses   Final diagnoses:  Acute respiratory failure with hypoxia ([place])  Suspected UTI  Hypokalemia  Diarrhea, unspecified type    New Prescriptions New Prescriptions   No medications on file     Varney Biles, MD 05/07/17 [place], [place], MD 05/07/17  [phone]

## 2017-05-08 ENCOUNTER — Inpatient Hospital Stay (HOSPITAL_COMMUNITY): Payer: Medicare HMO

## 2017-05-08 DIAGNOSIS — E872 Acidosis: Secondary | ICD-10-CM

## 2017-05-08 DIAGNOSIS — A419 Sepsis, unspecified organism: Principal | ICD-10-CM

## 2017-05-08 LAB — COMPREHENSIVE METABOLIC PANEL
ALT: 22 U/L (ref 14–54)
AST: 28 U/L (ref 15–41)
Albumin: 2.7 g/dL — ABNORMAL LOW (ref 3.5–5.0)
Alkaline Phosphatase: 79 U/L (ref 38–126)
Anion gap: 10 (ref 5–15)
BILIRUBIN TOTAL: 0.3 mg/dL (ref 0.3–1.2)
BUN: 14 mg/dL (ref 6–20)
CHLORIDE: 123 mmol/L — AB (ref 101–111)
CO2: 8 mmol/L — ABNORMAL LOW (ref 22–32)
CREATININE: 1.29 mg/dL — AB (ref 0.44–1.00)
Calcium: 7.9 mg/dL — ABNORMAL LOW (ref 8.9–10.3)
GFR, EST AFRICAN AMERICAN: 48 mL/min — AB (ref >60)
GFR, EST NON AFRICAN AMERICAN: 41 mL/min — AB (ref >60)
Glucose, Bld: 75 mg/dL (ref 65–99)
POTASSIUM: 2.7 mmol/L — AB (ref 3.5–5.1)
Sodium: 141 mmol/L (ref 135–145)
Total Protein: 6.8 g/dL (ref 6.5–8.1)

## 2017-05-08 LAB — URINE CULTURE

## 2017-05-08 LAB — CBC WITH DIFFERENTIAL/PLATELET
Basophils Absolute: 0 10*3/uL (ref 0.0–0.1)
Basophils Relative: 0 %
EOS PCT: 2 %
Eosinophils Absolute: 0.2 10*3/uL (ref 0.0–0.7)
HEMATOCRIT: 30.1 % — AB (ref 36.0–46.0)
Hemoglobin: 10 g/dL — ABNORMAL LOW (ref 12.0–15.0)
LYMPHS ABS: 3.3 10*3/uL (ref 0.7–4.0)
LYMPHS PCT: 23 %
MCH: 25.6 pg — AB (ref 26.0–34.0)
MCHC: 33.2 g/dL (ref 30.0–36.0)
MCV: 77 fL — AB (ref 78.0–100.0)
Monocytes Absolute: 1.4 10*3/uL — ABNORMAL HIGH (ref 0.1–1.0)
Monocytes Relative: 9 %
NEUTROS ABS: 9.8 10*3/uL — AB (ref 1.7–7.7)
Neutrophils Relative %: 66 %
PLATELETS: 338 10*3/uL (ref 150–400)
RBC: 3.91 MIL/uL (ref 3.87–5.11)
RDW: 18.9 % — AB (ref 11.5–15.5)
WBC: 14.7 10*3/uL — ABNORMAL HIGH (ref 4.0–10.5)

## 2017-05-08 LAB — BLOOD GAS, ARTERIAL
Drawn by: 280981
Drawn by: 270211
FIO2: 0.21
O2 Content: 3 L/min
O2 SAT: 98.3 %
O2 SAT: 98.4 %
PATIENT TEMPERATURE: 98.6
PATIENT TEMPERATURE: 98.6
PH ART: 7.312 — AB (ref 7.350–7.450)
pH, Arterial: 7.396 (ref 7.350–7.450)
pO2, Arterial: 150 mmHg — ABNORMAL HIGH (ref 83.0–108.0)
pO2, Arterial: 131 mmHg — ABNORMAL HIGH (ref 83.0–108.0)

## 2017-05-08 LAB — MAGNESIUM: MAGNESIUM: 1.4 mg/dL — AB (ref 1.7–2.4)

## 2017-05-08 LAB — GLUCOSE, CAPILLARY
Glucose-Capillary: 135 mg/dL — ABNORMAL HIGH (ref 65–99)
Glucose-Capillary: 62 mg/dL — ABNORMAL LOW (ref 65–99)

## 2017-05-08 MED ORDER — DEXTROSE 50 % IV SOLN
INTRAVENOUS | Status: AC
  Administered 2017-05-08: 25 mL
  Filled 2017-05-08: qty 50

## 2017-05-08 MED ORDER — MAGNESIUM SULFATE 2 GM/50ML IV SOLN
2.0000 g | Freq: Once | INTRAVENOUS | Status: AC
  Administered 2017-05-08: 2 g via INTRAVENOUS
  Filled 2017-05-08: qty 50

## 2017-05-08 MED ORDER — POTASSIUM CHLORIDE CRYS ER 20 MEQ PO TBCR
20.0000 meq | EXTENDED_RELEASE_TABLET | Freq: Two times a day (BID) | ORAL | Status: AC
  Administered 2017-05-08 (×2): 20 meq via ORAL
  Filled 2017-05-08 (×2): qty 1

## 2017-05-08 MED ORDER — POTASSIUM CHLORIDE 10 MEQ/100ML IV SOLN
10.0000 meq | INTRAVENOUS | Status: DC
  Filled 2017-05-08 (×2): qty 100

## 2017-05-08 MED ORDER — POTASSIUM CHLORIDE 10 MEQ/100ML IV SOLN
10.0000 meq | INTRAVENOUS | Status: AC
  Administered 2017-05-08 (×4): 10 meq via INTRAVENOUS
  Filled 2017-05-08 (×3): qty 100

## 2017-05-08 MED ORDER — SODIUM CHLORIDE 0.9% FLUSH
10.0000 mL | INTRAVENOUS | Status: DC | PRN
  Administered 2017-05-12 – 2017-05-14 (×4): 10 mL
  Filled 2017-05-08 (×4): qty 40

## 2017-05-08 MED ORDER — SODIUM BICARBONATE 8.4 % IV SOLN
INTRAVENOUS | Status: DC
  Administered 2017-05-08 – 2017-05-11 (×6): via INTRAVENOUS
  Filled 2017-05-08 (×6): qty 50

## 2017-05-08 NOTE — Progress Notes (Signed)
ABG not completed at this time- PICC line insertion taking place at this time. RN states she will call RT when PICC procedure is done. MD aware.

## 2017-05-08 NOTE — Progress Notes (Signed)
Peripherally Inserted Central Catheter/Midline Placement  The IV Nurse has discussed with the patient and/or persons authorized to consent for the patient, the purpose of this procedure and the potential benefits and risks involved with this procedure.  The benefits include less needle sticks, lab draws from the catheter, and the patient may be discharged home with the catheter. Risks include, but not limited to, infection, bleeding, blood clot (thrombus formation), and puncture of an artery; nerve damage and irregular heartbeat and possibility to perform a PICC exchange if needed/ordered by physician.  Alternatives to this procedure were also discussed.  B[facility] Power PICC patient education guide, fact sheet on infection prevention and patient information card has been provided to patient /or left at bedside.    PICC/Midline Placement Documentation     Telephone consent signed by Seward Grater 05/08/2017, 6:41 PM

## 2017-05-08 NOTE — Progress Notes (Signed)
CRITICAL VALUE ALERT  Critical Value: K+ 2.7  Date & Time Notied:  05/08/17, 0630  Provider Notified: Maudie Mercury  Orders Received/Actions taken: MD aware, to put in orders

## 2017-05-08 NOTE — Progress Notes (Signed)
RN reported to MD that it appeared that the patient's urine was the same color and consistency as the stool that is coming from the rectum. MD gave verbal order to place a foley catheter to evaluate the urine color to be sure that the patient does not have a fistula r/t her history of Chron's disease. Foley placed using sterile technique and clear yellow urine was returned. Patient was uncomfortable during the procedure d/t MASD and swelling in perineum area. MD made aware of findings. Will remove foley catheter and continue to monitor patient's output.   Othella Boyer [facility] 05/08/2017 6:29 PM

## 2017-05-08 NOTE — Progress Notes (Signed)
05-08-17  1540   IV Team Note;  Called STAT to restart new iv;  Pt currently with NO access; very very poor access even with ultrasound;  Att x 2 without success;  RN aware;  Suggest central access for this pt;   Raynelle Fanning RN IV Team

## 2017-05-08 NOTE — Progress Notes (Signed)
PROGRESS NOTE [facility]   Danielle [name]   HAL:[id number] DOB: 09-26-1946  DOA: 05/06/2017 PCP: Iona Beard, MD   Brief Narrative:  Patient is a 71 year old female with medical history significant for Crohn's disease, chronic kidney disease stage III, history of rectovaginal fistula and rectal abscess that was recently drained presented to the emergency department with shortness of breath, generalized weakness, dysuria and burning on urination. Patient was admitted for suspected sepsis with hypoxemia and acute renal failure. She had a positive d-dimer and VQ scan was done which showed low probability for pulmonary embolism. Doppler of the lower extremity was negative for DVT.  Subjective: Patient seen and examined, she reported feeling better but continued to be tachypneic. She has no chest pain, or palpitations. Continues to have diarrhea which she reported is normal for her given her Crohn's disease. No other complaints remains afebrile white count decreasing.  Assessment & Plan: Acute respiratory with hypoxia secondary to sepsis due to UTI. - This is multifactorial as patient also has metabolic acidosis from severe diarrhea, inducing hyperventilation.  PE  Unlikely - heparin drip was discontinued Continue antibiotics Treatment acidosis with bicarbonate drip, as this correct expect respiratory issues to resolve Get ABG Chest CT with no acute abnormalities Wean O2 as tolerated Urine culture multiple species, will not repeat given patient on antibiotics Blood culture thus far negative  Metabolic acidosis secondary to severe diarrhea from Crohn's disease Bicarbonate of 9 Bicarbonate drip  Hypokalemia secondary to diarrhea Repleted when necessary Check BMP in the morning  Acute kidney failure on chronic kidney disease stage III Creatinine remission 1.70, baseline creatinine about 1.1 Current creatinine 1.2 Improving with IV fluid We'll continue for now monitor for signs  of fluid overload  Crohn's disease ? Flare We'll continue to monitor as patient said that this is normal diarrhea for her If this continue will consult GI and consider IV steroids. Florastor   Rectal abscess, recently the drain, patient on antibiotics  DVT prophylaxis: SCD's  Code Status: FULL  Family Communication: None at bedside  Disposition Plan: Home when medically stable   Consultants:   None   Procedures:   None   Antimicrobials: Anti-infectives    Start     Dose/Rate Route Frequency Ordered Stop   05/07/17 2359  vancomycin (VANCOCIN) 500 mg in sodium chloride 0.9 % 100 mL IVPB  Status:  Discontinued     500 mg 100 mL/hr over 60 Minutes Intravenous Every 24 hours 05/07/17 0040 05/07/17 0051   05/07/17 2200  ceFEPIme (MAXIPIME) 2 g in dextrose 5 % 50 mL IVPB  Status:  Discontinued     2 g 100 mL/hr over 30 Minutes Intravenous Every 24 hours 05/06/17 2254 05/07/17 0028   05/07/17 2200  ceFEPIme (MAXIPIME) 1 g in dextrose 5 % 50 mL IVPB  Status:  Discontinued     1 g 100 mL/hr over 30 Minutes Intravenous Every 24 hours 05/07/17 0028 05/07/17 0040   05/07/17 2000  cefTRIAXone (ROCEPHIN) 1 g in dextrose 5 % 50 mL IVPB     1 g 100 mL/hr over 30 Minutes Intravenous Every 24 hours 05/07/17 0040     05/07/17 0030  metroNIDAZOLE (FLAGYL) tablet 500 mg  Status:  Discontinued     500 mg Oral Once 05/07/17 0028 05/07/17 0039   05/07/17 0000  vancomycin (VANCOCIN) IVPB 1000 mg/200 mL premix     1,000 mg 200 mL/hr over 60 Minutes Intravenous  Once 05/06/17 2348 05/07/17 0100   05/06/17 2230  ceFEPIme (MAXIPIME) 2 g in dextrose 5 % 50 mL IVPB     2 g 100 mL/hr over 30 Minutes Intravenous STAT 05/06/17 2224 05/06/17 2310        Objective: Vitals:   05/07/17 2050 05/08/17 0500 05/08/17 0613 05/08/17 1430  BP: (!) 103/48  (!) 106/49 101/60  Pulse: 99   99  Resp: (!) 22  (!) 22 20  Temp: 98.3 F (36.8 C)  99 F (37.2 C) 98.8 F (37.1 C)  TempSrc: Oral  Oral Oral  SpO2:  100%  100% 99%  Weight:  55.9 kg (123 lb 3.8 oz)    Height:        Intake/Output Summary (Last 24 hours) at 05/08/17 1815 Last data filed at 05/08/17 1400  Gross per 24 hour  Intake          3477.09 ml  Output              800 ml  Net          2677.09 ml   Filed Weights   05/06/17 2008 05/07/17 0247 05/08/17 0500  Weight: 58.1 kg (128 lb) 54.7 kg (120 lb 9.5 oz) 55.9 kg (123 lb 3.8 oz)    Examination:  General exam: Mild anxious, breathing heavy  Respiratory system: Clear to auscultation. Tachypnea, No wheezes,crackle or rhonchi Cardiovascular system: S1 & S2 heard, RRR. No JVD, murmurs, rubs or gallops Gastrointestinal system: Abdomen is nondistended, soft and nontender.  Central nervous system: Alert and oriented. No focal neurological deficits. Extremities: No pedal edema.  Skin: No rashes, skin dry  Psychiatry: Judgement and insight appear normal. Mood & affect appropriate.    Data Reviewed: I have personally reviewed following labs and imaging studies  CBC:  Recent Labs Lab 05/06/17 2122 05/06/17 2255 05/07/17 1351 05/08/17 0508  WBC 18.3*  --  17.7* 14.7*  NEUTROABS 10.9*  --   --  9.8*  HGB 11.8* 11.2* 11.6* 10.0*  HCT 35.1* 33.0* 35.0* 30.1*  MCV 77.0*  --  78.0 77.0*  PLT 451*  --  352 850   Basic Metabolic Panel:  Recent Labs Lab 05/06/17 2122 05/06/17 2255 05/07/17 1351 05/08/17 0508  NA 137 143 141 141  K 3.4* 2.9* 2.8* 2.7*  CL 115* 118* 121* 123*  CO2 9*  --  9* 8*  GLUCOSE 94 84 93 75  BUN 27* 25* 20 14  CREATININE 1.84* 1.70* 1.34* 1.29*  CALCIUM 8.6*  --  7.8* 7.9*  MG  --   --   --  1.4*   GFR: Estimated Creatinine Clearance: 36.3 mL/min (A) (by C-G formula based on SCr of 1.29 mg/dL (H)). Liver Function Tests:  Recent Labs Lab 05/06/17 2122 05/08/17 0508  AST 47* 28  ALT 33 22  ALKPHOS 92 79  BILITOT 1.0 0.3  PROT 8.2* 6.8  ALBUMIN 3.3* 2.7*   No results for input(s): LIPASE, AMYLASE in the last 168 hours. No results  for input(s): AMMONIA in the last 168 hours. Coagulation Profile:  Recent Labs Lab 05/06/17 2133  INR 1.18   Cardiac Enzymes:  Recent Labs Lab 05/07/17 0245 05/07/17 1151  TROPONINI <0.03 <0.03   BNP (last 3 results) No results for input(s): PROBNP in the last 8760 hours. HbA1C: No results for input(s): HGBA1C in the last 72 hours. CBG:  Recent Labs Lab 05/07/17 0834 05/07/17 1530 05/07/17 2101 05/08/17 0732 05/08/17 0813  GLUCAP 89 90 74 62* 135*   Lipid Profile: No results for  input(s): CHOL, HDL, LDLCALC, TRIG, CHOLHDL, LDLDIRECT in the last 72 hours. Thyroid Function Tests: No results for input(s): TSH, T4TOTAL, FREET4, T3FREE, THYROIDAB in the last 72 hours. Anemia Panel: No results for input(s): VITAMINB12, FOLATE, FERRITIN, TIBC, IRON, RETICCTPCT in the last 72 hours. Sepsis Labs:  Recent Labs Lab 05/06/17 2255 05/07/17 0029 05/07/17 0245 05/07/17 1351  PROCALCITON  --   --   --  0.73  LATICACIDVEN 1.78 1.63 2.0*  --     Recent Results (from the past 240 hour(s))  Blood Culture (routine x 2)     Status: None (Preliminary result)   Collection Time: 05/06/17  9:22 PM  Result Value Ref Range Status   Specimen Description BLOOD LEFT ANTECUBITAL  Final   Special Requests   Final    BOTTLES DRAWN AEROBIC AND ANAEROBIC Blood Culture adequate volume   Culture   Final    NO GROWTH 1 DAY Performed at [facility], [place] [address] [place], [place] [address]    Report Status PENDING  Incomplete  Blood Culture (routine x 2)     Status: None (Preliminary result)   Collection Time: 05/06/17 10:50 PM  Result Value Ref Range Status   Specimen Description BLOOD R UPPER ARM  Final   Special Requests IN PEDIATRIC BOTTLE Blood Culture adequate volume  Final   Culture   Final    NO GROWTH 1 DAY Performed at [facility], [place] [address], [place], [place] [address]    Report Status PENDING  Incomplete  Urine culture     Status: Abnormal   Collection  Time: 05/06/17 11:42 PM  Result Value Ref Range Status   Specimen Description URINE, RANDOM  Final   Special Requests NONE  Final   Culture MULTIPLE SPECIES PRESENT, SUGGEST RECOLLECTION (A)  Final   Report Status 05/08/2017 FINAL  Final  MRSA PCR Screening     Status: Abnormal   Collection Time: 05/07/17  4:41 AM  Result Value Ref Range Status   MRSA by PCR POSITIVE (A) NEGATIVE Final    Comment:        The GeneXpert MRSA Assay (FDA approved for NASAL specimens only), is one component of a comprehensive MRSA colonization surveillance program. It is not intended to diagnose MRSA infection nor to guide or monitor treatment for MRSA infections. RESULT CALLED TO, READ BACK BY AND VERIFIED WITH: DARK, D. RN @0900  ON 6.1.18 BY [email or web address]   C difficile quick scan w PCR reflex     Status: None   Collection Time: 05/07/17  9:03 AM  Result Value Ref Range Status   C Diff antigen NEGATIVE NEGATIVE Final   C Diff toxin NEGATIVE NEGATIVE Final   C Diff interpretation No C. difficile detected.  Final      Radiology Studies: Dg Chest 2 View  Result Date: 05/06/2017 CLINICAL DATA:  71 year old female with generalized weakness for 5 days. Cough. EXAM: CHEST  2 VIEW COMPARISON:  04/29/2017 and earlier. FINDINGS: Semi upright AP and lateral views of the chest. Large chronic gastric containing hiatal hernia. Stable cardiac size and mediastinal contours. Stable lung volumes. No pneumothorax, pulmonary edema, pleural effusion or confluent pulmonary opacity. Chronic right lateral rib fractures. Osteopenia. No acute osseous abnormality identified. Negative visible bowel gas pattern in the abdomen. IMPRESSION: 1.  No acute cardiopulmonary abnormality. 2. Chronic large hiatal hernia with intrathoracic stomach. Electronically Signed   By: Genevie Ann M.D.   On: 05/06/2017 22:00   Ct Chest Wo  Contrast  Result Date: 05/08/2017 CLINICAL DATA:  Dyspnea.  Suspected sepsis. EXAM: CT CHEST WITHOUT CONTRAST TECHNIQUE:  Multidetector CT imaging of the chest was performed following the standard protocol without IV contrast. COMPARISON:  Abdominal CT 02/03/2017 FINDINGS: Cardiovascular: The heart is displaced anteriorly. No cardiomegaly or pericardial effusion. LAD atherosclerotic calcification. No acute finding in the great vessels. 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 This degree of herniation has been seen since at least 2014 chest CT. No pneumomediastinum. No adenopathy or mass. Lungs/Pleura: Motion degraded evaluation. Minimal subpleural atelectasis or scarring. There is no edema, consolidation, effusion, or pneumothorax. Upper Abdomen: No acute finding.  Cholecystectomy clips. Musculoskeletal: No acute finding. Exaggerated thoracic kyphosis. Remote L1 inferior endplate deformity. IMPRESSION: 1. No acute finding.  Negative for pneumonia. 2. Chronic large hiatal hernia containing stomach and small bowel. Electronically Signed   By: Monte Fantasia M.D.   On: 05/08/2017 13:14   Nm Pulmonary Perfusion  Result Date: 05/07/2017 CLINICAL DATA:  Shortness of breath EXAM: NUCLEAR MEDICINE  PERFUSION LUNG SCAN TECHNIQUE: Perfusion images were obtained in multiple projections after intravenous injection of radiopharmaceutical. Sequential dynamic ventilation images were obtained in standard planar projections following inhalation of radiopharmaceutical. RADIOPHARMACEUTICALS:  4.2 mCi Tc70m MAA IV COMPARISON:  Chest radiograph May 06, 2017 FINDINGS: Patient was unable to tolerate ventilation study. Perfusion: Radiotracer uptake is homogeneous and symmetric bilaterally. No perfusion defects are evident. IMPRESSION: No evident perfusion defects. Very low probability of pulmonary embolus. Note that there is no ventilation study as patient could not tolerate ventilation study. Electronically Signed   By: Lowella Grip III M.D.   On:  05/07/2017 13:20   Dg Chest Port 1 View  Result Date: 05/07/2017 CLINICAL DATA:  Shortness of breath. EXAM: PORTABLE CHEST 1 VIEW COMPARISON:  Yesterday FINDINGS: Chronic cardiomegaly and gas distended intrathoracic stomach and esophagus. There is no edema, consolidation, effusion, or pneumothorax. No acute osseous finding. IMPRESSION: 1. No acute finding. 2. Chronic gaseous distention of intrathoracic stomach. Electronically Signed   By: Monte Fantasia M.D.   On: 05/07/2017 18:41    Scheduled Meds: . calcium-vitamin D  1 tablet Oral Daily  . Chlorhexidine Gluconate Cloth  6 each Topical Q0600  . cyanocobalamin  1,000 mcg Intramuscular Q30 days  . feeding supplement (ENSURE ENLIVE)  237 mL Oral BID BM  . magnesium oxide  400 mg Oral BID  . multivitamin with minerals  1 tablet Oral Daily  . mupirocin ointment  1 application Nasal BID  . potassium chloride  20 mEq Oral BID  . saccharomyces boulardii  250 mg Oral Daily  . sodium chloride flush  3 mL Intravenous Q12H   Continuous Infusions: . cefTRIAXone (ROCEPHIN)  IV Stopped (05/07/17 2220)  .  sodium bicarbonate  infusion 1000 mL 100 mL/hr at 05/08/17 1715     LOS: 1 day   Critical care time spent: 55 minutes    Chipper Oman, MD Pager: Text Page via [email or web address]  [phone]  If 7PM-7AM, please contact night-coverage [email or web address] Password [facility] 05/08/2017, 6:15 PM

## 2017-05-08 NOTE — Procedures (Signed)
ABG done per MD order.  PCo2 reading below reportable range and also missing multiple values.  ABG ran second time with same results.  Results called back to RN.  I also called Dr. Quincy Simmonds to make aware of results and missing values. No new orders at this time.

## 2017-05-08 NOTE — Progress Notes (Signed)
Hypoglycemic Event  CBG: 62 at 0745  Treatment: 36ml of D50 given IV  Symptoms: none  Follow-up CBG: Time:0805 CBG Result:135  Possible Reasons for Event: pt refusing oral intake except water  Comments/MD notified: will update MD during progression rounds    Danielle [name] [facility]

## 2017-05-08 NOTE — Procedures (Addendum)
Insertion of EJ peripheral line   Patient was placed on trendelenburg position, external jugular vein was identified via doppler US, vein was cannulated with a 20 french single lumen, blood return was identified and line was flushed with normal saline without resistance. Line secured with tape. Patient tolerated well the procedure.  Chipper Oman, MD

## 2017-05-08 NOTE — Evaluation (Signed)
Clinical/Bedside Swallow Evaluation Patient Details  Name: Danielle [name] MRN: [id number] Date of Birth: 04/24/1946  Today's Date: 05/08/2017 Time: SLP Start Time (ACUTE ONLY): 9935 SLP Stop Time (ACUTE ONLY): 1635 SLP Time Calculation (min) (ACUTE ONLY): 15 min  Past Medical History:  Past Medical History:  Diagnosis Date  . Crohn disease ([place])   . Hiatal hernia 12/07/2015  . Psoriasis    Past Surgical History:  Past Surgical History:  Procedure Laterality Date  . ANAL STENOSIS  ~ 2010  . APPENDECTOMY  1980's  . BOWEL RESECTION  2009  . CHOLECYSTECTOMY  1990's  . COLONOSCOPY  2013  . COLONOSCOPY WITH PROPOFOL N/A 03/12/2017   Procedure: COLONOSCOPY WITH PROPOFOL;  Surgeon: Rogene Houston, MD;  [facility];  Service: Endoscopy;  Laterality: N/A;  2:05-moved up to 1025 per Lelon Frohlich  . COLOSTOMY  2009   DEMASO  . COLOSTOMY TAKEDOWN  2009   "only had it for about 1 month" (08/22/2013)  . ESOPHAGEAL DILATION N/A 03/12/2017   Procedure: ESOPHAGEAL DILATION;  Surgeon: Rogene Houston, MD;  [facility];  Service: Endoscopy;  Laterality: N/A;  . ESOPHAGOGASTRODUODENOSCOPY  11/11/2011   Procedure: ESOPHAGOGASTRODUODENOSCOPY (EGD);  Surgeon: Missy Sabins, MD;  Location: [facility] ENDOSCOPY;  Service: Endoscopy;  Laterality: N/A;  . ESOPHAGOGASTRODUODENOSCOPY  05/18/2012   Procedure: ESOPHAGOGASTRODUODENOSCOPY (EGD);  Surgeon: Rogene Houston, MD;  [facility];  Service: Endoscopy;  Laterality: N/A;  200  . ESOPHAGOGASTRODUODENOSCOPY (EGD) WITH PROPOFOL N/A 03/12/2017   Procedure: ESOPHAGOGASTRODUODENOSCOPY (EGD) WITH PROPOFOL;  Surgeon: Rogene Houston, MD;  [facility];  Service: Endoscopy;  Laterality: N/A;  . FLEXIBLE SIGMOIDOSCOPY  11/11/2011   Procedure: FLEXIBLE SIGMOIDOSCOPY;  Surgeon: Missy Sabins, MD;  Location: [place];  Service: Endoscopy;  Laterality: N/A;  . goiter  1999  . INCISION AND DRAINAGE PERIRECTAL ABSCESS N/A 04/21/2017   Procedure:  IRRIGATION AND DEBRIDEMENT PERIRECTAL ABSCESS;  Surgeon: Coralie Keens, MD;  Location: [place];  Service: General;  Laterality: N/A;  . LEFT HEART CATHETERIZATION WITH CORONARY ANGIOGRAM N/A 07/09/2014   Procedure: LEFT HEART CATHETERIZATION WITH CORONARY ANGIOGRAM;  Surgeon: Peter M Martinique, MD;  Location: [facility] CATH LAB;  Service: Cardiovascular;  Laterality: N/A;  . PROCTOSCOPY N/A 01/31/2015   Procedure: RIGID PROCTOSCOPY;  Surgeon: Leighton Ruff, MD;  Location: [place];  Service: General;  Laterality: N/A;  . RECTAL EXAM UNDER ANESTHESIA N/A 01/31/2015   Procedure: RECTAL EXAM UNDER ANESTHESIA WITH PLACEMENT OF A SETON;  Surgeon: Leighton Ruff, MD;  Location: [place];  Service: General;  Laterality: N/A;   HPI:  Patient is a 71 y.o. female with PMH: Crohn's disease, chronic kidney disease, rectal abcess recently drained, presented to ED with SOB, generalized weakness, dysuria and burning on urination. Patient was admitted for suspected sepsis with hypoxemia and acute renal failure.    Assessment / Plan / Recommendation Clinical Impression  Patient reports with a mild-moderate oropharyngeal dysphagia, though difficult to fully assess, as patient is refusing PO intake and reports that solid foods get stuck and that she can't swallow them. During this BSE, patient swallowed small sips of thin liquids and did not exhibit any overt s/s of aspiration or penetration with good laryngeal elevation and mild decreased pharyngeal contraction per palpation. Patient exhibited multiple swallows and delayed oral transit with 1/4-1/2 teaspoon size amount of puree (applesauce). She did not c/o difficulty or pain, though she did exhibit increased WOB and RR as well as facial grimacing  when swallowing.  SLP Visit Diagnosis: Dysphagia, oropharyngeal phase (R13.12)    Aspiration Risk  Mild aspiration risk    Diet Recommendation Dysphagia 3 (Mech soft);Thin liquid;Other (Comment) (offer other puree and liquid choices )    Liquid Administration via: Spoon;Cup Medication Administration: Crushed with puree Supervision: Full supervision/cueing for compensatory strategies;Staff to assist with self feeding Compensations: Minimize environmental distractions Postural Changes: Seated upright at 90 degrees    Other  Recommendations Oral Care Recommendations: Oral care BID   Follow up Recommendations Skilled Nursing facility;24 hour supervision/assistance      Frequency and Duration min 2x/week  1 week       Prognosis Prognosis for Safe Diet Advancement: Fair Barriers to Reach Goals: Other (Comment)  Lethargy and poor intake     Swallow Study   General Date of Onset: 05/06/17 HPI: Patient is a 71 y.o. female with PMH: Crohn's disease, chronic kidney disease, rectal abcess recently drained, presented to ED with SOB, generalized weakness, dysuria and burning on urination. Patient was admitted for suspected sepsis with hypoxemia and acute renal failure.  Type of Study: Bedside Swallow Evaluation Previous Swallow Assessment: N/A Diet Prior to this Study: Regular;Thin liquids Temperature Spikes Noted: No History of Recent Intubation: No Behavior/Cognition: Lethargic/Drowsy;Cooperative Oral Cavity Assessment: Within Functional Limits Oral Care Completed by SLP: No Baseline Vocal Quality: Normal Volitional Cough: Cognitively unable to elicit Volitional Swallow: Able to elicit    Oral/Motor/Sensory Function Overall Oral Motor/Sensory Function: Generalized oral weakness Facial Symmetry: Within Functional Limits Facial Strength: Reduced right;Reduced left Lingual Symmetry: Within Functional Limits Lingual Strength: Reduced Lingual Sensation: Within Functional Limits Velum: Within Functional Limits Mandible: Within Functional Limits   Ice Chips     Thin Liquid Thin Liquid: Impaired Pharyngeal  Phase Impairments: Suspected delayed Swallow Other Comments: patient grimaced with swallow of water but did not  report pain    Nectar Thick Nectar Thick Liquid: Not tested   Honey Thick Honey Thick Liquid: Not tested   Puree Puree: Impaired Presentation: Spoon Pharyngeal Phase Impairments: Suspected delayed Swallow;Multiple swallows Other Comments: patient appeared with difficulty in swallowing but denied any difficulty or pain with swallow.   Solid   GO   Solid: Not tested        Sonia Baller, MA, CCC-SLP 05/08/17 5:51 PM

## 2017-05-09 DIAGNOSIS — K222 Esophageal obstruction: Secondary | ICD-10-CM | POA: Insufficient documentation

## 2017-05-09 DIAGNOSIS — R630 Anorexia: Secondary | ICD-10-CM

## 2017-05-09 DIAGNOSIS — Z79620 Long term (current) use of immunosuppressive biologic: Secondary | ICD-10-CM

## 2017-05-09 DIAGNOSIS — K529 Noninfective gastroenteritis and colitis, unspecified: Secondary | ICD-10-CM

## 2017-05-09 DIAGNOSIS — R634 Abnormal weight loss: Secondary | ICD-10-CM

## 2017-05-09 DIAGNOSIS — N823 Fistula of vagina to large intestine: Secondary | ICD-10-CM

## 2017-05-09 DIAGNOSIS — Z79899 Other long term (current) drug therapy: Secondary | ICD-10-CM

## 2017-05-09 LAB — CBC WITH DIFFERENTIAL/PLATELET
BASOS ABS: 0 10*3/uL (ref 0.0–0.1)
Basophils Relative: 0 %
EOS ABS: 0.3 10*3/uL (ref 0.0–0.7)
EOS PCT: 2 %
HCT: 28.2 % — ABNORMAL LOW (ref 36.0–46.0)
HEMOGLOBIN: 9.5 g/dL — AB (ref 12.0–15.0)
LYMPHS PCT: 30 %
Lymphs Abs: 4.6 10*3/uL — ABNORMAL HIGH (ref 0.7–4.0)
MCH: 25.6 pg — AB (ref 26.0–34.0)
MCHC: 33.7 g/dL (ref 30.0–36.0)
MCV: 76 fL — AB (ref 78.0–100.0)
Monocytes Absolute: 1.9 10*3/uL — ABNORMAL HIGH (ref 0.1–1.0)
Monocytes Relative: 12 %
NEUTROS PCT: 56 %
Neutro Abs: 8.8 10*3/uL — ABNORMAL HIGH (ref 1.7–7.7)
Platelets: 296 10*3/uL (ref 150–400)
RBC: 3.71 MIL/uL — AB (ref 3.87–5.11)
RDW: 18.8 % — ABNORMAL HIGH (ref 11.5–15.5)
WBC: 15.6 10*3/uL — AB (ref 4.0–10.5)

## 2017-05-09 LAB — BASIC METABOLIC PANEL
Anion gap: 8 (ref 5–15)
BUN: 10 mg/dL (ref 6–20)
CO2: 11 mmol/L — ABNORMAL LOW (ref 22–32)
Calcium: 8.1 mg/dL — ABNORMAL LOW (ref 8.9–10.3)
Chloride: 121 mmol/L — ABNORMAL HIGH (ref 101–111)
Creatinine, Ser: 1.12 mg/dL — ABNORMAL HIGH (ref 0.44–1.00)
GFR, EST AFRICAN AMERICAN: 57 mL/min — AB (ref >60)
GFR, EST NON AFRICAN AMERICAN: 49 mL/min — AB (ref >60)
Glucose, Bld: 125 mg/dL — ABNORMAL HIGH (ref 65–99)
Potassium: 2.4 mmol/L — CL (ref 3.5–5.1)
SODIUM: 140 mmol/L (ref 135–145)

## 2017-05-09 LAB — GLUCOSE, CAPILLARY: Glucose-Capillary: 109 mg/dL — ABNORMAL HIGH (ref 65–99)

## 2017-05-09 LAB — LACTIC ACID, PLASMA: Lactic Acid, Venous: 1.2 mmol/L (ref 0.5–1.9)

## 2017-05-09 LAB — MAGNESIUM: MAGNESIUM: 1.9 mg/dL (ref 1.7–2.4)

## 2017-05-09 LAB — PROCALCITONIN: PROCALCITONIN: 0.41 ng/mL

## 2017-05-09 MED ORDER — SODIUM CHLORIDE 0.9 % IV BOLUS (SEPSIS)
250.0000 mL | Freq: Once | INTRAVENOUS | Status: AC
  Administered 2017-05-09: 250 mL via INTRAVENOUS

## 2017-05-09 MED ORDER — LOPERAMIDE HCL 2 MG PO CAPS: 2.0000 mg | ORAL_CAPSULE | Freq: Three times a day (TID) | ORAL | Status: DC | PRN

## 2017-05-09 MED ORDER — DIPHENOXYLATE-ATROPINE 2.5-0.025 MG PO TABS: 1.0000 | ORAL_TABLET | Freq: Four times a day (QID) | ORAL | Status: DC | PRN

## 2017-05-09 MED ORDER — POTASSIUM CHLORIDE CRYS ER 20 MEQ PO TBCR
40.0000 meq | EXTENDED_RELEASE_TABLET | Freq: Two times a day (BID) | ORAL | Status: DC
  Administered 2017-05-09: 40 meq via ORAL
  Filled 2017-05-09: qty 2

## 2017-05-09 MED ORDER — DIPHENOXYLATE-ATROPINE 2.5-0.025 MG PO TABS
2.0000 | ORAL_TABLET | Freq: Two times a day (BID) | ORAL | Status: DC
  Administered 2017-05-09 – 2017-05-11 (×5): 2 via ORAL
  Filled 2017-05-09 (×7): qty 2

## 2017-05-09 MED ORDER — POTASSIUM CHLORIDE 10 MEQ/100ML IV SOLN
10.0000 meq | INTRAVENOUS | Status: AC
  Administered 2017-05-09 (×3): 10 meq via INTRAVENOUS
  Filled 2017-05-09 (×3): qty 100

## 2017-05-09 MED ORDER — ZINC OXIDE 40 % EX OINT
1.0000 "application " | TOPICAL_OINTMENT | Freq: Two times a day (BID) | CUTANEOUS | Status: DC
  Administered 2017-05-09 – 2017-05-13 (×9): 1 via TOPICAL
  Filled 2017-05-09 (×7): qty 113

## 2017-05-09 MED ORDER — POTASSIUM CHLORIDE 20 MEQ/15ML (10%) PO SOLN
40.0000 meq | Freq: Two times a day (BID) | ORAL | Status: DC
  Administered 2017-05-09 – 2017-05-11 (×6): 40 meq via ORAL
  Filled 2017-05-09 (×6): qty 30

## 2017-05-09 MED ORDER — SODIUM CHLORIDE 0.9 % IV BOLUS (SEPSIS)
1000.0000 mL | Freq: Once | INTRAVENOUS | Status: AC
  Administered 2017-05-09: 1000 mL via INTRAVENOUS

## 2017-05-09 MED ORDER — POTASSIUM CHLORIDE 10 MEQ/100ML IV SOLN
10.0000 meq | INTRAVENOUS | Status: AC
  Administered 2017-05-09 (×4): 10 meq via INTRAVENOUS
  Filled 2017-05-09 (×4): qty 100

## 2017-05-09 MED ORDER — PREDNISONE 20 MG PO TABS
40.0000 mg | ORAL_TABLET | Freq: Every day | ORAL | Status: DC
  Administered 2017-05-09 – 2017-05-10 (×2): 40 mg via ORAL
  Filled 2017-05-09 (×3): qty 2

## 2017-05-09 MED ORDER — PSYLLIUM 95 % PO PACK
1.0000 | PACK | Freq: Every day | ORAL | Status: DC
  Administered 2017-05-09: 1 via ORAL
  Filled 2017-05-09 (×3): qty 1

## 2017-05-09 MED ORDER — FERROUS SULFATE 325 (65 FE) MG PO TABS
325.0000 mg | ORAL_TABLET | Freq: Three times a day (TID) | ORAL | Status: DC
  Filled 2017-05-09 (×7): qty 1

## 2017-05-09 MED ORDER — DIPHENOXYLATE-ATROPINE 2.5-0.025 MG PO TABS: 1.0000 | ORAL_TABLET | Freq: Four times a day (QID) | ORAL | Status: DC

## 2017-05-09 NOTE — Progress Notes (Signed)
PROGRESS NOTE [facility]   Danielle [name]   NTI:[id number] DOB: 07/07/1946  DOA: 05/06/2017 PCP: Iona Beard, MD   Brief Narrative:  Patient is a 71 year old female with medical history significant for Crohn's disease, chronic kidney disease stage III, history of rectovaginal fistula and rectal abscess that was recently drained presented to the emergency department with shortness of breath, generalized weakness, dysuria and burning on urination. Patient was admitted for suspected sepsis with hypoxemia and acute renal failure. She had a positive d-dimer and VQ scan was done which showed low probability for pulmonary embolism. Doppler of the lower extremity was negative for DVT.  Subjective: Patient seen and examined, have no complaints. Best that I feel on few day. Breathing improved. Diarrhea continues. Wound now draining.   Assessment & Plan: Acute respiratory with hypoxia secondary to sepsis due to UTI. - This is multifactorial as patient also has metabolic acidosis from severe diarrhea, inducing hyperventilation. - Improved  PE  Unlikely - heparin drip was discontinued Treatment acidosis with bicarbonate drip, as this correct expect respiratory issues to resolve Chest CT with no acute abnormalities Continue rocephin for now  Off oxygen  Urine culture multiple species, will not repeat given patient on antibiotics Blood culture thus far negative  Metabolic acidosis secondary to severe diarrhea from Crohn's disease HCO3 improved  Continue Bicarb ggt  Expected to improve as diarrhea improve.  D/c Mag as probably contributing to diarrhea.   Hypokalemia secondary to diarrhea Will add oral potassium schedule and IV K  Check BMP in AM   Acute kidney failure on chronic kidney disease stage III - Improved  Creatinine remission 1.70. Current Cr at baseline 1.12 Patient on IVF given diarrhea and acidosis  Check BMP in AM   Crohn's disease ? Flare Continues to have diarrhea,  C diff negative   Lomotil scheduled to control diarrhea Will start prednisone 40 mg daily  Florastor   Perirectal abscess, recently the drain x 2  Wound examined, now draining purulent discharge. Surgery consulted - appreciate recommendations   DVT prophylaxis: SCD's  Code Status: FULL  Family Communication: None at bedside  Disposition Plan: Unable to determine at this time   Consultants:   None   Procedures:   None   Antimicrobials: Anti-infectives    Start     Dose/Rate Route Frequency Ordered Stop   05/07/17 2359  vancomycin (VANCOCIN) 500 mg in sodium chloride 0.9 % 100 mL IVPB  Status:  Discontinued     500 mg 100 mL/hr over 60 Minutes Intravenous Every 24 hours 05/07/17 0040 05/07/17 0051   05/07/17 2200  ceFEPIme (MAXIPIME) 2 g in dextrose 5 % 50 mL IVPB  Status:  Discontinued     2 g 100 mL/hr over 30 Minutes Intravenous Every 24 hours 05/06/17 2254 05/07/17 0028   05/07/17 2200  ceFEPIme (MAXIPIME) 1 g in dextrose 5 % 50 mL IVPB  Status:  Discontinued     1 g 100 mL/hr over 30 Minutes Intravenous Every 24 hours 05/07/17 0028 05/07/17 0040   05/07/17 2000  cefTRIAXone (ROCEPHIN) 1 g in dextrose 5 % 50 mL IVPB     1 g 100 mL/hr over 30 Minutes Intravenous Every 24 hours 05/07/17 0040     05/07/17 0030  metroNIDAZOLE (FLAGYL) tablet 500 mg  Status:  Discontinued     500 mg Oral Once 05/07/17 0028 05/07/17 0039   05/07/17 0000  vancomycin (VANCOCIN) IVPB 1000 mg/200 mL premix     1,000 mg 200  mL/hr over 60 Minutes Intravenous  Once 05/06/17 2348 05/07/17 0100   05/06/17 2230  ceFEPIme (MAXIPIME) 2 g in dextrose 5 % 50 mL IVPB     2 g 100 mL/hr over 30 Minutes Intravenous STAT 05/06/17 2224 05/06/17 2310       Objective: Vitals:   05/08/17 2236 05/09/17 0613 05/09/17 0631 05/09/17 0800  BP: (!) 108/57 (!) 97/51 (!) 97/50 (!) 104/55  Pulse: 98 (!) 107 (!) 107 (!) 104  Resp: 20 20    Temp: 99.2 F (37.3 C) 99.4 F (37.4 C)    TempSrc: Oral Oral    SpO2:  100% 100%    Weight:   57.3 kg (126 lb 5.2 oz)   Height:        Intake/Output Summary (Last 24 hours) at 05/09/17 1059 Last data filed at 05/09/17 0600  Gross per 24 hour  Intake          2429.17 ml  Output              200 ml  Net          2229.17 ml   Filed Weights   05/07/17 0247 05/08/17 0500 05/09/17 0631  Weight: 54.7 kg (120 lb 9.5 oz) 55.9 kg (123 lb 3.8 oz) 57.3 kg (126 lb 5.2 oz)    Examination:  General: Pt is alert, awake, not in acute distress Cardiovascular: RRR, S1/S2 +, no rubs, no gallops Respiratory: CTA bilaterally, no wheezing, no rhonchi Abdominal: Soft, NT, ND, bowel sounds +, perirectal wound draining purulent material, TTP. Induration around wound.  Extremities: no edema, no cyanosis   Data Reviewed: I have personally reviewed following labs and imaging studies  CBC:  Recent Labs Lab 05/06/17 2122 05/06/17 2255 05/07/17 1351 05/08/17 0508 05/09/17 0036  WBC 18.3*  --  17.7* 14.7* 15.6*  NEUTROABS 10.9*  --   --  9.8* 8.8*  HGB 11.8* 11.2* 11.6* 10.0* 9.5*  HCT 35.1* 33.0* 35.0* 30.1* 28.2*  MCV 77.0*  --  78.0 77.0* 76.0*  PLT 451*  --  352 338 016   Basic Metabolic Panel:  Recent Labs Lab 05/06/17 2122 05/06/17 2255 05/07/17 1351 05/08/17 0508 05/09/17 0036  NA 137 143 141 141 140  K 3.4* 2.9* 2.8* 2.7* 2.4*  CL 115* 118* 121* 123* 121*  CO2 9*  --  9* 8* 11*  GLUCOSE 94 84 93 75 125*  BUN 27* 25* 20 14 10   CREATININE 1.84* 1.70* 1.34* 1.29* 1.12*  CALCIUM 8.6*  --  7.8* 7.9* 8.1*  MG  --   --   --  1.4* 1.9   GFR: Estimated Creatinine Clearance: 41.8 mL/min (A) (by C-G formula based on SCr of 1.12 mg/dL (H)). Liver Function Tests:  Recent Labs Lab 05/06/17 2122 05/08/17 0508  AST 47* 28  ALT 33 22  ALKPHOS 92 79  BILITOT 1.0 0.3  PROT 8.2* 6.8  ALBUMIN 3.3* 2.7*   No results for input(s): LIPASE, AMYLASE in the last 168 hours. No results for input(s): AMMONIA in the last 168 hours. Coagulation Profile:  Recent  Labs Lab 05/06/17 2133  INR 1.18   Cardiac Enzymes:  Recent Labs Lab 05/07/17 0245 05/07/17 1151  TROPONINI <0.03 <0.03   BNP (last 3 results) No results for input(s): PROBNP in the last 8760 hours. HbA1C: No results for input(s): HGBA1C in the last 72 hours. CBG:  Recent Labs Lab 05/07/17 1530 05/07/17 2101 05/08/17 0732 05/08/17 0813 05/09/17 0756  GLUCAP 90  74 62* 135* 109*   Lipid Profile: No results for input(s): CHOL, HDL, LDLCALC, TRIG, CHOLHDL, LDLDIRECT in the last 72 hours. Thyroid Function Tests: No results for input(s): TSH, T4TOTAL, FREET4, T3FREE, THYROIDAB in the last 72 hours. Anemia Panel: No results for input(s): VITAMINB12, FOLATE, FERRITIN, TIBC, IRON, RETICCTPCT in the last 72 hours. Sepsis Labs:  Recent Labs Lab 05/06/17 2255 05/07/17 0029 05/07/17 0245 05/07/17 1351 05/09/17 0036  PROCALCITON  --   --   --  0.73 0.41  LATICACIDVEN 1.78 1.63 2.0*  --   --     Recent Results (from the past 240 hour(s))  Blood Culture (routine x 2)     Status: None (Preliminary result)   Collection Time: 05/06/17  9:22 PM  Result Value Ref Range Status   Specimen Description BLOOD LEFT ANTECUBITAL  Final   Special Requests   Final    BOTTLES DRAWN AEROBIC AND ANAEROBIC Blood Culture adequate volume   Culture   Final    NO GROWTH 1 DAY Performed at [facility], [place] [address], [place], [place] [address]    Report Status PENDING  Incomplete  Blood Culture (routine x 2)     Status: None (Preliminary result)   Collection Time: 05/06/17 10:50 PM  Result Value Ref Range Status   Specimen Description BLOOD R UPPER ARM  Final   Special Requests IN PEDIATRIC BOTTLE Blood Culture adequate volume  Final   Culture   Final    NO GROWTH 1 DAY Performed at [facility], [place] [address], [place], [place] [address]    Report Status PENDING  Incomplete  Urine culture     Status: Abnormal   Collection Time: 05/06/17 11:42 PM  Result Value Ref Range  Status   Specimen Description URINE, RANDOM  Final   Special Requests NONE  Final   Culture MULTIPLE SPECIES PRESENT, SUGGEST RECOLLECTION (A)  Final   Report Status 05/08/2017 FINAL  Final  MRSA PCR Screening     Status: Abnormal   Collection Time: 05/07/17  4:41 AM  Result Value Ref Range Status   MRSA by PCR POSITIVE (A) NEGATIVE Final    Comment:        The GeneXpert MRSA Assay (FDA approved for NASAL specimens only), is one component of a comprehensive MRSA colonization surveillance program. It is not intended to diagnose MRSA infection nor to guide or monitor treatment for MRSA infections. RESULT CALLED TO, READ BACK BY AND VERIFIED WITH: DARK, D. RN @0900  ON 6.1.18 BY [email or web address]   C difficile quick scan w PCR reflex     Status: None   Collection Time: 05/07/17  9:03 AM  Result Value Ref Range Status   C Diff antigen NEGATIVE NEGATIVE Final   C Diff toxin NEGATIVE NEGATIVE Final   C Diff interpretation No C. difficile detected.  Final      Radiology Studies: Ct Chest Wo Contrast  Result Date: 05/08/2017 CLINICAL DATA:  Dyspnea.  Suspected sepsis. EXAM: CT CHEST WITHOUT CONTRAST TECHNIQUE: Multidetector CT imaging of the chest was performed following the standard protocol without IV contrast. COMPARISON:  Abdominal CT 02/03/2017 FINDINGS: Cardiovascular: The heart is displaced anteriorly. No cardiomegaly or pericardial effusion. LAD atherosclerotic calcification. No acute finding in the great vessels. 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 This degree of herniation has been seen since at least 2014 chest CT. No pneumomediastinum. No adenopathy or mass. Lungs/Pleura: Motion  degraded evaluation. Minimal subpleural atelectasis or scarring. There is no edema, consolidation, effusion, or pneumothorax. Upper Abdomen: No acute finding.  Cholecystectomy clips. Musculoskeletal: No acute  finding. Exaggerated thoracic kyphosis. Remote L1 inferior endplate deformity. IMPRESSION: 1. No acute finding.  Negative for pneumonia. 2. Chronic large hiatal hernia containing stomach and small bowel. Electronically Signed   By: Monte Fantasia M.D.   On: 05/08/2017 13:14   Nm Pulmonary Perfusion  Result Date: 05/07/2017 CLINICAL DATA:  Shortness of breath EXAM: NUCLEAR MEDICINE  PERFUSION LUNG SCAN TECHNIQUE: Perfusion images were obtained in multiple projections after intravenous injection of radiopharmaceutical. Sequential dynamic ventilation images were obtained in standard planar projections following inhalation of radiopharmaceutical. RADIOPHARMACEUTICALS:  4.2 mCi Tc34m MAA IV COMPARISON:  Chest radiograph May 06, 2017 FINDINGS: Patient was unable to tolerate ventilation study. Perfusion: Radiotracer uptake is homogeneous and symmetric bilaterally. No perfusion defects are evident. IMPRESSION: No evident perfusion defects. Very low probability of pulmonary embolus. Note that there is no ventilation study as patient could not tolerate ventilation study. Electronically Signed   By: Lowella Grip III M.D.   On: 05/07/2017 13:20   Dg Chest Port 1 View  Result Date: 05/07/2017 CLINICAL DATA:  Shortness of breath. EXAM: PORTABLE CHEST 1 VIEW COMPARISON:  Yesterday FINDINGS: Chronic cardiomegaly and gas distended intrathoracic stomach and esophagus. There is no edema, consolidation, effusion, or pneumothorax. No acute osseous finding. IMPRESSION: 1. No acute finding. 2. Chronic gaseous distention of intrathoracic stomach. Electronically Signed   By: Monte Fantasia M.D.   On: 05/07/2017 18:41    Scheduled Meds: . calcium-vitamin D  1 tablet Oral Daily  . Chlorhexidine Gluconate Cloth  6 each Topical Q0600  . cyanocobalamin  1,000 mcg Intramuscular Q30 days  . feeding supplement (ENSURE ENLIVE)  237 mL Oral BID BM  . magnesium oxide  400 mg Oral BID  . multivitamin with minerals  1 tablet Oral  Daily  . mupirocin ointment  1 application Nasal BID  . potassium chloride  40 mEq Oral BID  . predniSONE  40 mg Oral Q breakfast  . saccharomyces boulardii  250 mg Oral Daily  . sodium chloride flush  3 mL Intravenous Q12H   Continuous Infusions: . cefTRIAXone (ROCEPHIN)  IV Stopped (05/08/17 2054)  . potassium chloride    .  sodium bicarbonate  infusion 1000 mL 100 mL/hr at 05/09/17 0343  . sodium chloride       LOS: 2 days    Chipper Oman, MD Pager: Text Page via [email or web address]  [phone]  If 7PM-7AM, please contact night-coverage [email or web address] Password [email or web address] 05/09/2017, 10:59 AM

## 2017-05-09 NOTE — Consult Note (Signed)
[facility]  [address] [place], [place] [address] Phone: [phone] FAX: [phone]     Danielle [name]  [birth year] [id number]  CARE TEAM:  PCP: Iona Beard, MD  Outpatient Care Team: Patient Care Team: Iona Beard, MD as PCP - General (Family Medicine) Teena Irani, MD as Consulting Physician (Gastroenterology) Rogene Houston, MD as Consulting Physician (Gastroenterology) Burke Keels, MD (Inactive) as Consulting Physician (General Surgery) Michael Boston, MD as Consulting Physician (General Surgery) Leighton Ruff, MD as Consulting Physician (General Surgery)  Inpatient Treatment Team: Treatment Team: Attending Provider: Patrecia Pour, Christean Grief, MD; Technician: Gardiner Ramus, NT; Rounding Team: Threasa Beards, MD; Registered Nurse: Idelia Salm, RN; Registered Nurse: Leandrew Koyanagi, RN; Consulting Physician: [name], Md, MD   This patient is a 71 y.o.female who presents today for surgical evaluation at the request of Dr Elon Jester.   Chief complaint / Reason for evaluation: Perianal pain.  Possible abscess  71 year old female with Crohn's disease.  Significant perianal disease requiring interventions by Dr. Marcello Moores of our group for the past two years.  He is immunosuppressed on Remicade every six weeks.  Paul by Dr. Vida Rigger gastroenterology.  Had perineal fistula with seton.  Treated.  Seton removed last year.  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  Developed swollen abscess requiring incision and drainage in February 2018.  Another abscess requiring incision and drainage three weeks ago.  Patient being admitted for respiratory distress.  Concern for increased drainage and surgical evaluation requested.  Patient has had at least one resection of the ileocecal region.  Tends to have loose bowels.  About 2-3 a day.  Follow by Dr. Collene Mares.  Large hiatal hernia  with heartburn and reflux managed with Protonix.  Patient notes her bowels become much more loose and virtually uncontrolled.  Apparently been packing the perirectal wound.  She has some subjective fevers.     Assessment  Shela Leff  71 y.o. female       Problem List:  Principal Problem:   Acute respiratory failure with hypoxia ([place]) Active Problems:   Leukocytosis   Acute renal failure superimposed on stage 3 chronic kidney disease ([place])   Immunosuppressed status (HCC)   Crohn disease (HCC)   Hypokalemia   Hypotension   GERD (gastroesophageal reflux disease)   UTI (urinary tract infection)  Uncontrolled diarrhea.  C. difficile negative.  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  No definite abscess.  Plan:  -I cannot detect a new perirectal abscess or concerning area.  Some tenderness and swelling around the chronic perirectal wound but it is open.  Should she not improve, could consider CT scan of pelvis to rule out a deeper area but I think it would make sense to slow down the diarrhea first and recovery group.  I would try not to repack the wound at this time.  Let it drain.  Do sitz baths.  Try to control her diarrhea.  Keep the area  clean and dry.  -Try to slow down diarrhea since does not appear to be C. difficile related.  Place back on Lomotil.  Add iron.  Add low-dose fiber to help bulk up stool slowly.  Challenge in a patient with chronic diarrhea.  Hold Mag-Ox as that probably is making diarrhea worse.  Reasonable to do steroid for possible Crohn's flare.  She is about to if not overdue for Remicade treatment.  Consider  consulting gastroenterology since Dr. Laural Golden is not at this hospital.    Aggressive correction of hypokalemia.  -VTE prophylaxis- SCDs, etc  -mobilize as tolerated to help recovery  45` minutes spent in review, evaluation, examination, counseling, and coordination of care.  More than 50% of that time was spent in  counseling.  [name], M.D., F.A.C.S. Gastrointestinal and Minimally Invasive Surgery [facility] [address] [place], [place] [address] [phone] Main / Paging   05/09/2017      Past Medical History:  Diagnosis Date  . Crohn disease ([place])   . Hiatal hernia 12/07/2015  . Psoriasis     Past Surgical History:  Procedure Laterality Date  . ANAL STENOSIS  ~ 2010  . APPENDECTOMY  1980's  . BOWEL RESECTION  2009  . CHOLECYSTECTOMY  1990's  . COLONOSCOPY  2013  . COLONOSCOPY WITH PROPOFOL N/A 03/12/2017   Procedure: COLONOSCOPY WITH PROPOFOL;  Surgeon: Rogene Houston, MD;  [facility];  Service: Endoscopy;  Laterality: N/A;  2:05-moved up to 1025 per Lelon Frohlich  . COLOSTOMY  2009   DEMASO  . COLOSTOMY TAKEDOWN  2009   "only had it for about 1 month" (08/22/2013)  . ESOPHAGEAL DILATION N/A 03/12/2017   Procedure: ESOPHAGEAL DILATION;  Surgeon: Rogene Houston, MD;  [facility];  Service: Endoscopy;  Laterality: N/A;  . ESOPHAGOGASTRODUODENOSCOPY  11/11/2011   Procedure: ESOPHAGOGASTRODUODENOSCOPY (EGD);  Surgeon: Missy Sabins, MD;  Location: [facility] ENDOSCOPY;  Service: Endoscopy;  Laterality: N/A;  . ESOPHAGOGASTRODUODENOSCOPY  05/18/2012   Procedure: ESOPHAGOGASTRODUODENOSCOPY (EGD);  Surgeon: Rogene Houston, MD;  [facility];  Service: Endoscopy;  Laterality: N/A;  200  . ESOPHAGOGASTRODUODENOSCOPY (EGD) WITH PROPOFOL N/A 03/12/2017   Procedure: ESOPHAGOGASTRODUODENOSCOPY (EGD) WITH PROPOFOL;  Surgeon: Rogene Houston, MD;  [facility];  Service: Endoscopy;  Laterality: N/A;  . FLEXIBLE SIGMOIDOSCOPY  11/11/2011   Procedure: FLEXIBLE SIGMOIDOSCOPY;  Surgeon: Missy Sabins, MD;  Location: [facility];  Service: Endoscopy;  Laterality: N/A;  . goiter  1999  . INCISION AND DRAINAGE PERIRECTAL ABSCESS N/A 04/21/2017   Procedure: IRRIGATION AND DEBRIDEMENT PERIRECTAL ABSCESS;  Surgeon: Coralie Keens,  MD;  Location: [facility];  Service: General;  Laterality: N/A;  . LEFT HEART CATHETERIZATION WITH CORONARY ANGIOGRAM N/A 07/09/2014   Procedure: LEFT HEART CATHETERIZATION WITH CORONARY ANGIOGRAM;  Surgeon: Peter M Martinique, MD;  Location: [facility] CATH LAB;  Service: Cardiovascular;  Laterality: N/A;  . PROCTOSCOPY N/A 01/31/2015   Procedure: RIGID PROCTOSCOPY;  Surgeon: Leighton Ruff, MD;  Location: [facility];  Service: General;  Laterality: N/A;  . RECTAL EXAM UNDER ANESTHESIA N/A 01/31/2015   Procedure: RECTAL EXAM UNDER ANESTHESIA WITH PLACEMENT OF A SETON;  Surgeon: Leighton Ruff, MD;  Location: [facility];  Service: General;  Laterality: N/A;    Social History   Social History  . Marital status: Single    Spouse name: N/A  . Number of children: N/A  . Years of education: N/A   Occupational History  . Not on file.   Social History Main Topics  . Smoking status: Former Smoker    Packs/day: 0.12    Years: 4.00    Types: Cigarettes    Quit date: 10/05/1996  . Smokeless tobacco: Never Used     Comment: 08/22/2013 1 pack every two weeks when she did smoked  . Alcohol use No  . Drug use: No  . Sexual activity: Not Currently  Birth control/ protection: Post-menopausal, Implant   Other Topics Concern  . Not on file   Social History Narrative   Lives in [place].  Moved from [place] in 2008.  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  LIves with her sister.   No home health services.      Family History  Problem Relation Age of Onset  . Diabetes Mother   . Heart disease Father   . Healthy Sister   . Hypertension Brother   . Colon cancer Brother        died with colon cancer  . Crohn's disease Paternal Aunt     Current Facility-Administered Medications  Medication Dose Route Frequency Provider Last Rate Last Dose  . acetaminophen (TYLENOL) tablet 650 mg  650 mg Oral Q6H PRN Ivor Costa, MD      . albuterol (PROVENTIL) (2.5 MG/3ML) 0.083% nebulizer solution 2.5 mg  2.5 mg Nebulization Q4H PRN Ivor Costa, MD       . calcium-vitamin D (OSCAL WITH D) 500-200 MG-UNIT per tablet 1 tablet  1 tablet Oral Daily Ivor Costa, MD   1 tablet at 05/09/17 1137  . cefTRIAXone (ROCEPHIN) 1 g in dextrose 5 % 50 mL IVPB  1 g Intravenous Q24H Ivor Costa, MD   Stopped at 05/08/17 2054  . Chlorhexidine Gluconate Cloth 2 % PADS 6 each  6 each Topical Q0600 Patrecia Pour, Christean Grief, MD   6 each at 05/09/17 0631  . cyanocobalamin ((VITAMIN B-12)) injection 1,000 mcg  1,000 mcg Intramuscular Q30 days Ivor Costa, MD   1,000 mcg at 05/07/17 1332  . dicyclomine (BENTYL) 10 MG/5ML syrup 20 mg  20 mg Oral TID PRN Ivor Costa, MD      . diphenoxylate-atropine (LOMOTIL) 2.5-0.025 MG per tablet 1 tablet  1 tablet Oral QID PRN Ivor Costa, MD      . feeding supplement (ENSURE ENLIVE) (ENSURE ENLIVE) liquid 237 mL  237 mL Oral BID BM Patrecia Pour, Christean Grief, MD   237 mL at 05/08/17 1100  . Gerhardt's butt cream 1 application  1 application Topical QID PRN Ivor Costa, MD      . HYDROcodone-acetaminophen ([place]) 7.5-325 MG per tablet 1 tablet  1 tablet Oral Q6H PRN Ivor Costa, MD      . magnesium oxide (MAG-OX) tablet 400 mg  400 mg Oral BID Ivor Costa, MD   400 mg at 05/09/17 1137  . multivitamin with minerals tablet 1 tablet  1 tablet Oral Daily Ivor Costa, MD   1 tablet at 05/07/17 1308  . mupirocin ointment (BACTROBAN) 2 % 1 application  1 application Nasal BID Patrecia Pour, Christean Grief, MD   1 application at 76/28/31 1135  . nystatin (MYCOSTATIN/NYSTOP) topical powder 1 g  1 g Topical BID PRN Ivor Costa, MD      . ondansetron [facility]) tablet 4 mg  4 mg Oral Q6H PRN Ivor Costa, MD       Or  . ondansetron [facility]) injection 4 mg  4 mg Intravenous Q6H PRN Ivor Costa, MD      . potassium chloride 10 mEq in 100 mL IVPB  10 mEq Intravenous Q1 Hr x 4 Patrecia Pour, Christean Grief, MD 100 mL/hr at 05/09/17 1136 10 mEq at 05/09/17 1136  . potassium chloride 20 MEQ/15ML (10%) solution 40 mEq  40 mEq Oral BID Patrecia Pour, Christean Grief, MD   40 mEq at 05/09/17 1138  . predniSONE  (DELTASONE) tablet 40 mg  40 mg Oral Q breakfast Patrecia Pour, Christean Grief, MD      .  promethazine (PHENERGAN) tablet 25 mg  25 mg Oral BID PRN Ivor Costa, MD      . saccharomyces boulardii (FLORASTOR) capsule 250 mg  250 mg Oral Daily Ivor Costa, MD   250 mg at 05/09/17 1136  . sodium bicarbonate 50 mEq in dextrose 5 % 1,000 mL infusion   Intravenous Continuous Patrecia Pour, Christean Grief, MD 100 mL/hr at 05/09/17 0343    . sodium chloride 0.9 % bolus 1,000 mL  1,000 mL Intravenous Once Patrecia Pour, Christean Grief, MD 1,000 mL/hr at 05/09/17 1135 1,000 mL at 05/09/17 1135  . sodium chloride flush (NS) 0.9 % injection 10-40 mL  10-40 mL Intracatheter PRN Patrecia Pour, Christean Grief, MD      . sodium chloride flush (NS) 0.9 % injection 3 mL  3 mL Intravenous Q12H Ivor Costa, MD   3 mL at 05/07/17 0241  . zolpidem (AMBIEN) tablet 5 mg  5 mg Oral QHS PRN Ivor Costa, MD         Allergies  Allergen Reactions  . Zithromax [Azithromycin] Swelling and Other (See Comments)    Reaction:  Mouth swelling and blisters   . Aspirin Other (See Comments)    Reaction:  Stomach cramping   . Ibuprofen Other (See Comments)    Pt states that she prefers not to take this medication.   . Sulfa Antibiotics Nausea And Vomiting    ROS:   All other systems reviewed & are negative except per HPI or as noted below: Constitutional:  No fevers, chills, sweats.  Weight stable Eyes:  No vision changes, No discharge HENT:  No sore throats, nasal drainage Lymph: No neck swelling, No bruising easily Pulmonary:  No cough, productive sputum CV: No exertional chest/neck/shoulder/arm pain. GI: No personal nor family history of GI/colon cancer,allergy such as Celiac Sprue, dietary/dairy problems, gastritis.  No recent sick contacts/gastroenteritis.  No travel outside the country.  No changes in diet.  Crohn's disease and hiatal hernia with reflux and Schatzki's ring at noted above  Renal: +UTI.  No hematuria Genital:  No drainage, bleeding,  masses Musculoskeletal: No severe joint pain.  Good ROM major joints Skin:  No sores or lesions.  No rashes Heme/Lymph:  No easy bleeding.  No swollen lymph nodes Neuro: No focal weakness/numbness.  No seizures Psych: No suicidal ideation.  No hallucinations  BP (!) 104/55   Pulse (!) 104   Temp 99.4 F (37.4 C) (Oral)   Resp 20   Ht 5' 4.5" (1.638 m)   Wt 57.3 kg (126 lb 5.2 oz)   SpO2 100%   BMI 21.35 kg/m   Physical Exam: General: Pt awake/alert/oriented x4 in mild no major acute distress Eyes: PERRL, normal EOM. Sclera nonicteric Neuro: CN II-XII intact w/o focal sensory/motor deficits. Lymph: No head/neck/groin lymphadenopathy Psych:  No delerium/psychosis/paranoia HENT: Normocephalic, Mucus membranes moist.  No thrush Neck: Supple, No tracheal deviation Chest: No pain.  Good respiratory excursion. CV:  Pulses intact.  HR 105.  Regular rhythm Abdomen: Soft, Nondistended.  Nontender.  No incarcerated hernias. Gen:  No inguinal hernias.  No inguinal lymphadenopathy.    No vaginal bleeding  Rectal: Had uncontrolled thinly green high volume diarrhea at the bedside.  Has a left posterior perirectal wound packed.  I removed packing.  2 cm region of induration around the external wound but no focal new fluctuance.  Scarring consistent with prior incision and drainage.  I held off on any digital or anoscopic exam given her uncontrolled diarrhea.  Ext:  SCDs BLE.  No significant edema.  No cyanosis Skin: No petechiae / purpurea.  No major sores Musculoskeletal: No severe joint pain.  Good ROM major joints   Results:   Labs: Results for orders placed or performed during the hospital encounter of 05/06/17 (from the past 48 hour(s))  Troponin I (q 6hr x 3)     Status: None   Collection Time: 05/07/17 11:51 AM  Result Value Ref Range   Troponin I <0.03 <0.03 ng/mL  Heparin level (unfractionated)     Status: Abnormal   Collection Time: 05/07/17 11:51 AM  Result Value Ref Range    Heparin Unfractionated 0.94 (H) 0.30 - 0.70 IU/mL    Comment:        IF HEPARIN RESULTS ARE BELOW EXPECTED VALUES, AND PATIENT DOSAGE HAS BEEN CONFIRMED, SUGGEST FOLLOW UP TESTING OF ANTITHROMBIN III LEVELS.   Basic metabolic panel     Status: Abnormal   Collection Time: 05/07/17  1:51 PM  Result Value Ref Range   Sodium 141 135 - 145 mmol/L   Potassium 2.8 (L) 3.5 - 5.1 mmol/L   Chloride 121 (H) 101 - 111 mmol/L   CO2 9 (L) 22 - 32 mmol/L   Glucose, Bld 93 65 - 99 mg/dL   BUN 20 6 - 20 mg/dL   Creatinine, Ser 1.34 (H) 0.44 - 1.00 mg/dL   Calcium 7.8 (L) 8.9 - 10.3 mg/dL   GFR calc non Af Amer 39 (L) >60 mL/min   GFR calc Af Amer 46 (L) >60 mL/min    Comment: (NOTE) The eGFR has been calculated using the CKD EPI equation. This calculation has not been validated in all clinical situations. eGFR's persistently <60 mL/min signify possible Chronic Kidney Disease.    Anion gap 11 5 - 15  CBC     Status: Abnormal   Collection Time: 05/07/17  1:51 PM  Result Value Ref Range   WBC 17.7 (H) 4.0 - 10.5 K/uL   RBC 4.49 3.87 - 5.11 MIL/uL   Hemoglobin 11.6 (L) 12.0 - 15.0 g/dL   HCT 35.0 (L) 36.0 - 46.0 %   MCV 78.0 78.0 - 100.0 fL   MCH 25.8 (L) 26.0 - 34.0 pg   MCHC 33.1 30.0 - 36.0 g/dL   RDW 18.9 (H) 11.5 - 15.5 %   Platelets 352 150 - 400 K/uL  Procalcitonin - Baseline     Status: None   Collection Time: 05/07/17  1:51 PM  Result Value Ref Range   Procalcitonin 0.73 ng/mL    Comment:        Interpretation: PCT > 0.5 ng/mL and <= 2 ng/mL: Systemic infection (sepsis) is possible, but other conditions are known to elevate PCT as well. (NOTE)         ICU PCT Algorithm               Non ICU PCT Algorithm    ----------------------------     ------------------------------         PCT < 0.25 ng/mL                 PCT < 0.1 ng/mL     Stopping of antibiotics            Stopping of antibiotics       strongly encouraged.               strongly encouraged.     ----------------------------     ------------------------------       PCT level decrease  by               PCT < 0.25 ng/mL       >= 80% from peak PCT       OR PCT 0.25 - 0.5 ng/mL          Stopping of antibiotics                                             encouraged.     Stopping of antibiotics           encouraged.    ----------------------------     ------------------------------       PCT level decrease by              PCT >= 0.25 ng/mL       < 80% from peak PCT        AND PCT >= 0.5 ng/mL             Continuing antibiotics                                              encouraged.       Continuing antibiotics            encouraged.    ----------------------------     ------------------------------     PCT level increase compared          PCT > 0.5 ng/mL         with peak PCT AND          PCT >= 0.5 ng/mL             Escalation of antibiotics                                          strongly encouraged.      Escalation of antibiotics        strongly encouraged.   Glucose, capillary     Status: None   Collection Time: 05/07/17  3:30 PM  Result Value Ref Range   Glucose-Capillary 90 65 - 99 mg/dL  Glucose, capillary     Status: None   Collection Time: 05/07/17  9:01 PM  Result Value Ref Range   Glucose-Capillary 74 65 - 99 mg/dL  CBC with Differential/Platelet     Status: Abnormal   Collection Time: 05/08/17  5:08 AM  Result Value Ref Range   WBC 14.7 (H) 4.0 - 10.5 K/uL   RBC 3.91 3.87 - 5.11 MIL/uL   Hemoglobin 10.0 (L) 12.0 - 15.0 g/dL   HCT 30.1 (L) 36.0 - 46.0 %   MCV 77.0 (L) 78.0 - 100.0 fL   MCH 25.6 (L) 26.0 - 34.0 pg   MCHC 33.2 30.0 - 36.0 g/dL   RDW 18.9 (H) 11.5 - 15.5 %   Platelets 338 150 - 400 K/uL   Neutrophils Relative % 66 %   Neutro Abs 9.8 (H) 1.7 - 7.7 K/uL   Lymphocytes Relative 23 %   Lymphs Abs 3.3 0.7 - 4.0 K/uL   Monocytes Relative 9 %   Monocytes Absolute 1.4 (H) 0.1 - 1.0 K/uL   Eosinophils Relative  2 %   Eosinophils Absolute 0.2 0.0 -  0.7 K/uL   Basophils Relative 0 %   Basophils Absolute 0.0 0.0 - 0.1 K/uL  Comprehensive metabolic panel     Status: Abnormal   Collection Time: 05/08/17  5:08 AM  Result Value Ref Range   Sodium 141 135 - 145 mmol/L   Potassium 2.7 (LL) 3.5 - 5.1 mmol/L    Comment: CRITICAL RESULT CALLED TO, READ BACK BY AND VERIFIED WITH: S.SEAGRAVES,RN [id number] 05/08/17 W.SHEA    Chloride 123 (H) 101 - 111 mmol/L   CO2 8 (L) 22 - 32 mmol/L   Glucose, Bld 75 65 - 99 mg/dL   BUN 14 6 - 20 mg/dL   Creatinine, Ser 1.29 (H) 0.44 - 1.00 mg/dL   Calcium 7.9 (L) 8.9 - 10.3 mg/dL   Total Protein 6.8 6.5 - 8.1 g/dL   Albumin 2.7 (L) 3.5 - 5.0 g/dL   AST 28 15 - 41 U/L   ALT 22 14 - 54 U/L   Alkaline Phosphatase 79 38 - 126 U/L   Total Bilirubin 0.3 0.3 - 1.2 mg/dL   GFR calc non Af Amer 41 (L) >60 mL/min   GFR calc Af Amer 48 (L) >60 mL/min    Comment: (NOTE) The eGFR has been calculated using the CKD EPI equation. This calculation has not been validated in all clinical situations. eGFR's persistently <60 mL/min signify possible Chronic Kidney Disease.    Anion gap 10 5 - 15  Magnesium     Status: Abnormal   Collection Time: 05/08/17  5:08 AM  Result Value Ref Range   Magnesium 1.4 (L) 1.7 - 2.4 mg/dL  Glucose, capillary     Status: Abnormal   Collection Time: 05/08/17  7:32 AM  Result Value Ref Range   Glucose-Capillary 62 (L) 65 - 99 mg/dL  Glucose, capillary     Status: Abnormal   Collection Time: 05/08/17  8:13 AM  Result Value Ref Range   Glucose-Capillary 135 (H) 65 - 99 mg/dL  Blood gas, arterial     Status: Abnormal   Collection Time: 05/08/17  1:38 PM  Result Value Ref Range   O2 Content 3.0 L/min   Delivery systems NASAL CANNULA    pH, Arterial 7.312 (L) 7.350 - 7.450   pCO2 arterial BELOW REPORTABLE RANGE 32.0 - 48.0 mmHg    Comment: RBVSANDRA GAGLIANO AT 1349 BY L.DOYLE ON 05/08/17 CORRECTED ON 06/02 AT 1446: PREVIOUSLY REPORTED AS ABOVE REPORTABLE RANGE, CORRECTED ON 06/02 AT 1420:  PREVIOUSLY REPORTED AS RBV SANDRA GAGLIANO,RN AT 1349 BY L.DOYLE ON 05/08/17    pO2, Arterial 150 (H) 83.0 - 108.0 mmHg   O2 Saturation 98.3 %   Patient temperature 98.6    Collection site LEFT RADIAL    Drawn by [id number]    Sample type ARTERIAL DRAW    Allens test (pass/fail) PASS PASS  Blood gas, arterial     Status: Abnormal   Collection Time: 05/08/17  6:39 PM  Result Value Ref Range   FIO2 0.21    pH, Arterial 7.396 7.350 - 7.450   pCO2 arterial BELOW REPORTABLE RANGE 32.0 - 48.0 mmHg    Comment: RBV JESSICA DOUTCH RN BY ANGIE DUNLAP RRT RCP ON 05/08/17 AT 1841   pO2, Arterial 131 (H) 83.0 - 108.0 mmHg   Bicarbonate BELOW REPORTABLE RANGE 20.0 - 28.0 mmol/L    Comment: RBV JESSICA DOUTCH RN BY ANGIE DUNLAP RRT RCP ON 05/08/17 AT 1841   O2 Saturation  98.4 %   Patient temperature 98.6    Collection site BRACHIAL ARTERY    Drawn by [id number]    Sample type ARTERIAL DRAW    Allens test (pass/fail) PASS PASS  Procalcitonin     Status: None   Collection Time: 05/09/17 12:36 AM  Result Value Ref Range   Procalcitonin 0.41 ng/mL    Comment:        Interpretation: PCT (Procalcitonin) <= 0.5 ng/mL: Systemic infection (sepsis) is not likely. Local bacterial infection is possible. (NOTE)         ICU PCT Algorithm               Non ICU PCT Algorithm    ----------------------------     ------------------------------         PCT < 0.25 ng/mL                 PCT < 0.1 ng/mL     Stopping of antibiotics            Stopping of antibiotics       strongly encouraged.               strongly encouraged.    ----------------------------     ------------------------------       PCT level decrease by               PCT < 0.25 ng/mL       >= 80% from peak PCT       OR PCT 0.25 - 0.5 ng/mL          Stopping of antibiotics                                             encouraged.     Stopping of antibiotics           encouraged.    ----------------------------     ------------------------------       PCT  level decrease by              PCT >= 0.25 ng/mL       < 80% from peak PCT        AND PCT >= 0.5 ng/mL            Continuin g antibiotics                                              encouraged.       Continuing antibiotics            encouraged.    ----------------------------     ------------------------------     PCT level increase compared          PCT > 0.5 ng/mL         with peak PCT AND          PCT >= 0.5 ng/mL             Escalation of antibiotics                                          strongly encouraged.      Escalation of antibiotics  strongly encouraged.   Basic metabolic panel     Status: Abnormal   Collection Time: 05/09/17 12:36 AM  Result Value Ref Range   Sodium 140 135 - 145 mmol/L   Potassium 2.4 (LL) 3.5 - 5.1 mmol/L    Comment: CRITICAL RESULT CALLED TO, READ BACK BY AND VERIFIED WITH: ASRO,J RN [id number] [place],N    Chloride 121 (H) 101 - 111 mmol/L   CO2 11 (L) 22 - 32 mmol/L   Glucose, Bld 125 (H) 65 - 99 mg/dL   BUN 10 6 - 20 mg/dL   Creatinine, Ser 1.12 (H) 0.44 - 1.00 mg/dL   Calcium 8.1 (L) 8.9 - 10.3 mg/dL   GFR calc non Af Amer 49 (L) >60 mL/min   GFR calc Af Amer 57 (L) >60 mL/min    Comment: (NOTE) The eGFR has been calculated using the CKD EPI equation. This calculation has not been validated in all clinical situations. eGFR's persistently <60 mL/min signify possible Chronic Kidney Disease.    Anion gap 8 5 - 15  CBC with Differential/Platelet     Status: Abnormal   Collection Time: 05/09/17 12:36 AM  Result Value Ref Range   WBC 15.6 (H) 4.0 - 10.5 K/uL   RBC 3.71 (L) 3.87 - 5.11 MIL/uL   Hemoglobin 9.5 (L) 12.0 - 15.0 g/dL   HCT 28.2 (L) 36.0 - 46.0 %   MCV 76.0 (L) 78.0 - 100.0 fL   MCH 25.6 (L) 26.0 - 34.0 pg   MCHC 33.7 30.0 - 36.0 g/dL   RDW 18.8 (H) 11.5 - 15.5 %   Platelets 296 150 - 400 K/uL   Neutrophils Relative % 56 %   Neutro Abs 8.8 (H) 1.7 - 7.7 K/uL   Lymphocytes Relative 30 %   Lymphs Abs 4.6 (H) 0.7  - 4.0 K/uL   Monocytes Relative 12 %   Monocytes Absolute 1.9 (H) 0.1 - 1.0 K/uL   Eosinophils Relative 2 %   Eosinophils Absolute 0.3 0.0 - 0.7 K/uL   Basophils Relative 0 %   Basophils Absolute 0.0 0.0 - 0.1 K/uL  Magnesium     Status: None   Collection Time: 05/09/17 12:36 AM  Result Value Ref Range   Magnesium 1.9 1.7 - 2.4 mg/dL  Glucose, capillary     Status: Abnormal   Collection Time: 05/09/17  7:56 AM  Result Value Ref Range   Glucose-Capillary 109 (H) 65 - 99 mg/dL    Imaging / Studies: Dg Chest 2 View  Result Date: 05/06/2017 CLINICAL DATA:  71 year old female with generalized weakness for 5 days. Cough. EXAM: CHEST  2 VIEW COMPARISON:  04/29/2017 and earlier. FINDINGS: Semi upright AP and lateral views of the chest. Large chronic gastric containing hiatal hernia. Stable cardiac size and mediastinal contours. Stable lung volumes. No pneumothorax, pulmonary edema, pleural effusion or confluent pulmonary opacity. Chronic right lateral rib fractures. Osteopenia. No acute osseous abnormality identified. Negative visible bowel gas pattern in the abdomen. IMPRESSION: 1.  No acute cardiopulmonary abnormality. 2. Chronic large hiatal hernia with intrathoracic stomach. Electronically Signed   By: Genevie Ann M.D.   On: 05/06/2017 22:00   Dg Chest 2 View  Result Date: 04/29/2017 CLINICAL DATA:  Shortness of breath and headache EXAM: CHEST  2 VIEW COMPARISON:  Chest radiograph 04/23/2017 FINDINGS: There is a massive hiatal hernia, unchanged. The cardiac silhouette is unchanged and within normal limits for size. No focal consolidation or pulmonary edema. No pneumothorax or pleural effusion. IMPRESSION: Unchanged  massive hiatal hernia.  No focal airspace disease. Electronically Signed   By: Ulyses Jarred M.D.   On: 04/29/2017 13:40   Dg Chest 2 View  Result Date: 04/23/2017 CLINICAL DATA:  Chest pain EXAM: CHEST  2 VIEW COMPARISON:  02/03/2017, 10/16/2016, 10/06/2016 FINDINGS: Large hiatal  hernia containing dilated bowel, increased compared to 10/16/2016, similar compared with 09/18/2016. No pleural effusion or focal consolidation. Stable cardiomediastinal silhouette. No pneumothorax. Surgical clips in the right upper quadrant. IMPRESSION: No focal consolidation or effusion. Large hiatal hernia containing air filled dilated bowel. Electronically Signed   By: Donavan Foil M.D.   On: 04/23/2017 17:40   Ct Head Wo Contrast  Result Date: 04/25/2017 CLINICAL DATA:  Right facial numbness EXAM: CT HEAD WITHOUT CONTRAST TECHNIQUE: Contiguous axial images were obtained from the base of the skull through the vertex without intravenous contrast. COMPARISON:  Head CT 08/31/2011 FINDINGS: Brain: No mass lesion, intraparenchymal hemorrhage or extra-axial collection. No evidence of acute cortical infarct. 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 Brain parenchyma is otherwise normal for age. Vascular: No hyperdense vessel or unexpected calcification. Skull: Normal visualized skull base, calvarium and extracranial soft tissues. Sinuses/Orbits: No sinus fluid levels or advanced mucosal thickening. No mastoid effusion. Normal orbits. IMPRESSION: Unchanged left internal capsule lacunar infarct compared to 08/31/2011. No acute intracranial abnormality. Electronically Signed   By: Ulyses Jarred M.D.   On: 04/25/2017 03:43   Ct Chest Wo Contrast  Result Date: 05/08/2017 CLINICAL DATA:  Dyspnea.  Suspected sepsis. EXAM: CT CHEST WITHOUT CONTRAST TECHNIQUE: Multidetector CT imaging of the chest was performed following the standard protocol without IV contrast. COMPARISON:  Abdominal CT 02/03/2017 FINDINGS: Cardiovascular: The heart is displaced anteriorly. No cardiomegaly or pericardial effusion. LAD atherosclerotic calcification. No acute finding in the great vessels. 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 This degree of herniation has been seen since at least 2014 chest CT. No pneumomediastinum. No adenopathy or mass. Lungs/Pleura: Motion degraded evaluation. Minimal subpleural atelectasis or scarring. There is no edema, consolidation, effusion, or pneumothorax. Upper Abdomen: No acute finding.  Cholecystectomy clips. Musculoskeletal: No acute finding. Exaggerated thoracic kyphosis. Remote L1 inferior endplate deformity. IMPRESSION: 1. No acute finding.  Negative for pneumonia. 2. Chronic large hiatal hernia containing stomach and small bowel. Electronically Signed   By: Monte Fantasia M.D.   On: 05/08/2017 13:14   Mr Jodene Nam Head Wo Contrast  Result Date: 04/25/2017 CLINICAL DATA:  Patient hospitalized 4 days ago for I & D of perirectal abscess. New onset of right-sided facial numbness and tingling beginning at 2:40 a.m. today. Possible right facial droop. The examination had to be discontinued prior to completion due to patient refusal for further imaging. Diffusion-weighted imaging, sagittal T1 weighted sequences, axial gradient sequences, and MRA time-of-flight sequences were performed. EXAM: MRI HEAD WITHOUT CONTRAST MRA HEAD WITHOUT CONTRAST TECHNIQUE: Multiplanar, multiecho pulse sequences of the brain and surrounding structures were obtained without intravenous contrast. Angiographic images of the head were obtained using MRA technique without contrast. COMPARISON:  CT head without contrast from the same day. FINDINGS: MRI HEAD FINDINGS Brain: A remote lacunar infarct is again noted in the anterior limb of the left internal capsule. The diffusion-weighted images demonstrate no acute or subacute infarction. Additional sequences were not performed. Vascular: Flow is present in the major intracranial arteries. Skull and upper cervical spine: The craniocervical junction is normal. Midline sagittal structures are unremarkable. Sinuses/Orbits: The paranasal sinuses  and mastoid air cells are  clear. The globes and orbits are unremarkable. MRA HEAD FINDINGS The internal carotid artery is are within normal limits the high cervical segments through the ICA termini bilaterally. The A1 and M1 segments are normal. Anterior communicating artery is patent. ACA can't MCA branch vessels are within normal limits. The vertebral arteries are codominant. The right PICA origin is visualized and normal. The left PICA is not visualized. The left AICA is dominant. Basilar artery is normal. Both posterior cerebral arteries originate basilar tip. PCA branch vessels are within normal limits. IMPRESSION: 1. No acute intracranial abnormality. The study was discontinued early as the patient refused to complete the exam. 2. 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 3. Normal variant MRA circle of Willis without significant proximal stenosis, aneurysm, or branch vessel occlusion. Electronically Signed   By: San Morelle M.D.   On: 04/25/2017 11:13   Mr Brain Wo Contrast  Result Date: 04/25/2017 CLINICAL DATA:  Patient hospitalized 4 days ago for I & D of perirectal abscess. New onset of right-sided facial numbness and tingling beginning at 2:40 a.m. today. Possible right facial droop. The examination had to be discontinued prior to completion due to patient refusal for further imaging. Diffusion-weighted imaging, sagittal T1 weighted sequences, axial gradient sequences, and MRA time-of-flight sequences were performed. EXAM: MRI HEAD WITHOUT CONTRAST MRA HEAD WITHOUT CONTRAST TECHNIQUE: Multiplanar, multiecho pulse sequences of the brain and surrounding structures were obtained without intravenous contrast. Angiographic images of the head were obtained using MRA technique without contrast. COMPARISON:  CT head without contrast from the same day. FINDINGS: MRI HEAD FINDINGS Brain: A remote lacunar infarct is again noted in the anterior limb of the left internal capsule. The diffusion-weighted images  demonstrate no acute or subacute infarction. Additional sequences were not performed. Vascular: Flow is present in the major intracranial arteries. Skull and upper cervical spine: The craniocervical junction is normal. Midline sagittal structures are unremarkable. Sinuses/Orbits: The paranasal sinuses and mastoid air cells are clear. The globes and orbits are unremarkable. MRA HEAD FINDINGS The internal carotid artery is are within normal limits the high cervical segments through the ICA termini bilaterally. The A1 and M1 segments are normal. Anterior communicating artery is patent. ACA can't MCA branch vessels are within normal limits. The vertebral arteries are codominant. The right PICA origin is visualized and normal. The left PICA is not visualized. The left AICA is dominant. Basilar artery is normal. Both posterior cerebral arteries originate basilar tip. PCA branch vessels are within normal limits. IMPRESSION: 1. No acute intracranial abnormality. The study was discontinued early as the patient refused to complete the exam. 2. 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 3. Normal variant MRA circle of Willis without significant proximal stenosis, aneurysm, or branch vessel occlusion. Electronically Signed   By: San Morelle M.D.   On: 04/25/2017 11:13   Nm Pulmonary Perfusion  Result Date: 05/07/2017 CLINICAL DATA:  Shortness of breath EXAM: NUCLEAR MEDICINE  PERFUSION LUNG SCAN TECHNIQUE: Perfusion images were obtained in multiple projections after intravenous injection of radiopharmaceutical. Sequential dynamic ventilation images were obtained in standard planar projections following inhalation of radiopharmaceutical. RADIOPHARMACEUTICALS:  4.2 mCi [id number] IV COMPARISON:  Chest radiograph May 06, 2017 FINDINGS: Patient was unable to tolerate ventilation study. Perfusion: Radiotracer uptake is homogeneous and symmetric bilaterally. No perfusion defects are evident. IMPRESSION:  No evident perfusion defects. Very low probability of pulmonary embolus. Note that there is no ventilation study as patient  could not tolerate ventilation study. Electronically Signed   By: Lowella Grip III M.D.   On: 05/07/2017 13:20   Ct Maxillofacial W Contrast  Result Date: 04/26/2017 CLINICAL DATA:  Sudden facial swelling. EXAM: CT MAXILLOFACIAL WITH CONTRAST TECHNIQUE: Multidetector CT imaging of the maxillofacial structures was performed. Multiplanar CT image reconstructions were also generated. A small metallic BB was placed on the right temple in order to reliably differentiate right from left. COMPARISON:  MRI brain 04/25/2017. FINDINGS: Osseous: No fracture or mandibular dislocation. No destructive process. Orbits: Negative. No traumatic or inflammatory finding. Sinuses: Mild mucosal thickening in the maxillary and ethmoid sinuses. No significant opacity or layering fluid. Soft tissues: No evidence for significant cellulitis or subcutaneous edema or air. Limited intracranial: Unremarkable. IMPRESSION: No evidence for significant facial cellulitis, subcutaneous edema, tear, or other significant process. No underlying osseous abnormality. Electronically Signed   By: Staci Righter M.D.   On: 04/26/2017 17:25   Dg Chest Port 1 View  Result Date: 05/07/2017 CLINICAL DATA:  Shortness of breath. EXAM: PORTABLE CHEST 1 VIEW COMPARISON:  Yesterday FINDINGS: Chronic cardiomegaly and gas distended intrathoracic stomach and esophagus. There is no edema, consolidation, effusion, or pneumothorax. No acute osseous finding. IMPRESSION: 1. No acute finding. 2. Chronic gaseous distention of intrathoracic stomach. Electronically Signed   By: Monte Fantasia M.D.   On: 05/07/2017 18:41    Medications / Allergies: per chart  Antibiotics: Anti-infectives    Start     Dose/Rate Route Frequency Ordered Stop   05/07/17 2359  vancomycin (VANCOCIN) 500 mg in sodium chloride 0.9 % 100 mL IVPB  Status:   Discontinued     500 mg 100 mL/hr over 60 Minutes Intravenous Every 24 hours 05/07/17 0040 05/07/17 0051   05/07/17 2200  ceFEPIme (MAXIPIME) 2 g in dextrose 5 % 50 mL IVPB  Status:  Discontinued     2 g 100 mL/hr over 30 Minutes Intravenous Every 24 hours 05/06/17 2254 05/07/17 0028   05/07/17 2200  ceFEPIme (MAXIPIME) 1 g in dextrose 5 % 50 mL IVPB  Status:  Discontinued     1 g 100 mL/hr over 30 Minutes Intravenous Every 24 hours 05/07/17 0028 05/07/17 0040   05/07/17 2000  cefTRIAXone (ROCEPHIN) 1 g in dextrose 5 % 50 mL IVPB     1 g 100 mL/hr over 30 Minutes Intravenous Every 24 hours 05/07/17 0040     05/07/17 0030  metroNIDAZOLE (FLAGYL) tablet 500 mg  Status:  Discontinued     500 mg Oral Once 05/07/17 0028 05/07/17 0039   05/07/17 0000  vancomycin (VANCOCIN) IVPB 1000 mg/200 mL premix     1,000 mg 200 mL/hr over 60 Minutes Intravenous  Once 05/06/17 2348 05/07/17 0100   05/06/17 2230  ceFEPIme (MAXIPIME) 2 g in dextrose 5 % 50 mL IVPB     2 g 100 mL/hr over 30 Minutes Intravenous STAT 05/06/17 2224 05/06/17 2310        Note: Portions of this report may have been transcribed using voice recognition software. Every effort was made to ensure accuracy; however, inadvertent computerized transcription errors may be present.   Any transcriptional errors that result from this process are unintentional.    [name], M.D., F.A.C.S. Gastrointestinal and Minimally Invasive Surgery [facility] [address] [place], [place] [address] [phone] Main / Paging   05/09/2017

## 2017-05-09 NOTE — Progress Notes (Signed)
Per Dr. Johney Maine, a flexiseal for the patient's watery diarrhea is contraindicated due to the patient's chronic fistula. Will continue to clean patient frequently as stool is continuously leaking out of rectum with patient being incontinent at this time. Patient has also become incontinent of urine possibly due to urgency. She does not know she has to go until it starts coming out. Sitz bath given per orders and Destin cream applied. Abscess left open to air per verbal conversation with Dr. Johney Maine.

## 2017-05-09 NOTE — Progress Notes (Signed)
Patient offered sitz bath per order and patient refused. Patient BP 88/67. Patient asymptomatic. On call MD made aware. New orders placed.

## 2017-05-10 LAB — GLUCOSE, CAPILLARY: Glucose-Capillary: 135 mg/dL — ABNORMAL HIGH (ref 65–99)

## 2017-05-10 LAB — BASIC METABOLIC PANEL
Anion gap: 7 (ref 5–15)
BUN: 8 mg/dL (ref 6–20)
CO2: 12 mmol/L — ABNORMAL LOW (ref 22–32)
Calcium: 7.5 mg/dL — ABNORMAL LOW (ref 8.9–10.3)
Chloride: 116 mmol/L — ABNORMAL HIGH (ref 101–111)
Creatinine, Ser: 0.95 mg/dL (ref 0.44–1.00)
GFR calc Af Amer: >60 mL/min (ref >60)
GFR calc non Af Amer: 60 mL/min — ABNORMAL LOW (ref >60)
Glucose, Bld: 142 mg/dL — ABNORMAL HIGH (ref 65–99)
Potassium: 3.5 mmol/L (ref 3.5–5.1)
Sodium: 135 mmol/L (ref 135–145)

## 2017-05-10 LAB — CBC
HEMATOCRIT: 39.1 % (ref 36.0–46.0)
HEMOGLOBIN: 13.5 g/dL (ref 12.0–15.0)
MCH: 26.2 pg (ref 26.0–34.0)
MCHC: 34.5 g/dL (ref 30.0–36.0)
MCV: 75.8 fL — ABNORMAL LOW (ref 78.0–100.0)
Platelets: 142 10*3/uL — ABNORMAL LOW (ref 150–400)
RBC: 5.16 MIL/uL — AB (ref 3.87–5.11)
RDW: 18.6 % — ABNORMAL HIGH (ref 11.5–15.5)
WBC: 6 10*3/uL (ref 4.0–10.5)

## 2017-05-10 LAB — SEDIMENTATION RATE: Sed Rate: 40 mm/hr — ABNORMAL HIGH (ref 0–22)

## 2017-05-10 LAB — C-REACTIVE PROTEIN: CRP: 7.3 mg/dL — ABNORMAL HIGH (ref <1.0)

## 2017-05-10 MED ORDER — LOPERAMIDE HCL 2 MG PO CAPS
4.0000 mg | ORAL_CAPSULE | ORAL | Status: DC | PRN
  Administered 2017-05-10 (×2): 4 mg via ORAL
  Filled 2017-05-10 (×2): qty 2

## 2017-05-10 NOTE — Progress Notes (Signed)
Pt needs better control of her diarrhea/Crohn's disease.  Rec GI eval while she is in house.  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

## 2017-05-10 NOTE — Consult Note (Signed)
[facility] Nurse wound consult note Reason for Consult: Perirectal abscess, chronic with enterovaginal fistula present.  Moisture associated skin damage (incontinence) due to stool and urine insult multiple times daily.  Wound type:MASD Pressure Injury POA: N/A Measurement: Erythema with scattered nonintact areas to perineum and buttocks.  Wound WLK:HVFM and moist Drainage (amount, consistency, odor) Scant weeping.  Contaminated with effluent from fistula and perirectal abscess frequently. Patient refused sitz bath today, per bedside RN.  Periwound:Erytehma and tenderness.  Dressing procedure/placement/frequency:Cleanse perineal skin with soap and water thoroughly.  Apply Boudreaux's butt paste to red, denuded and tender skin for protection.  If becomes soiled, wipe the visibly soiled area down and reapply.  Do not scrub.  Prompt skin care when soiling occurs.  Will not follow at this time.  Please re-consult if needed.  Domenic Moras RN BSN [place] Pager [phone]

## 2017-05-10 NOTE — Progress Notes (Addendum)
PROGRESS NOTE [facility]   Danielle [name]   YNW:[id number] DOB: [birth year]  DOA: 05/06/2017 PCP: Iona Beard, MD   Brief Narrative:  Patient is a 71 year old female with medical history significant for Crohn's disease, chronic kidney disease stage III, history of rectovaginal fistula and rectal abscess that was recently drained presented to the emergency department with shortness of breath, generalized weakness, dysuria and burning on urination. Patient was admitted for suspected sepsis with hypoxemia and acute renal failure. She had a positive d-dimer and VQ scan was done which showed low probability for pulmonary embolism. Doppler of the lower extremity was negative for DVT. Heparin drip was discontinued, she was found to have metabolic acidosis, which was leading to tachypnea. Bicarb ggt was started with improvement of her respiratory status, now trying to control her diarrhea from Crohn's she has been started on steroids.   Subjective: Patient seen and examined, diarrhea has improve with the addition of prednisone.    Assessment & Plan: Acute respiratory with hypoxia - Multifactorial for sepsis 2/2 UTI and metabolic acidosis from severe diarrhea, inducing hyperventilation. - Resolved  PE  Unlikely - heparin drip was discontinued Treatment acidosis with bicarbonate drip, as this correct expect respiratory issues to resolve Chest CT with no acute abnormalities Off oxygen   Sepsis 2/2 UTI - sepsis physiology has resolved  On Rocephin will complete 7 day of abx day 4/7 Urine culture multiple species, will not repeat given patient on antibiotics Blood culture thus far negative  Metabolic acidosis secondary to severe diarrhea from Crohn's disease HCO3 continues to improve  Continue Bicarb ggt  Expected to improve as diarrhea improve.  D/c Mag as probably contributing to diarrhea.   Hypokalemia secondary to diarrhea - Improved  Continue oral K  Check BMP in AM   Acute  kidney failure on chronic kidney disease stage III - Improved  Creatinine remission 1.70. Current Cr 0.95 Patient on IVF given diarrhea and acidosis  Check BMP in AM   Crohn's disease ? Flare Patient started prednisone 40 mg - diarrhea has improved Will add loperamide  C diff negative   Lomotil scheduled to control diarrhea  Florastor  Will discuss case with GI in AM   Perirectal abscess, recently the drain x 2  Wound examined, now draining purulent discharge. Surgery consulted - appreciate recommendations   DVT prophylaxis: SCD's  Code Status: FULL  Family Communication: None at bedside  Disposition Plan: Unable to determine at this time   Consultants:   None   Procedures:   None   Antimicrobials: Anti-infectives    Start     Dose/Rate Route Frequency Ordered Stop   05/07/17 2359  vancomycin (VANCOCIN) 500 mg in sodium chloride 0.9 % 100 mL IVPB  Status:  Discontinued     500 mg 100 mL/hr over 60 Minutes Intravenous Every 24 hours 05/07/17 0040 05/07/17 0051   05/07/17 2200  ceFEPIme (MAXIPIME) 2 g in dextrose 5 % 50 mL IVPB  Status:  Discontinued     2 g 100 mL/hr over 30 Minutes Intravenous Every 24 hours 05/06/17 2254 05/07/17 0028   05/07/17 2200  ceFEPIme (MAXIPIME) 1 g in dextrose 5 % 50 mL IVPB  Status:  Discontinued     1 g 100 mL/hr over 30 Minutes Intravenous Every 24 hours 05/07/17 0028 05/07/17 0040   05/07/17 2000  cefTRIAXone (ROCEPHIN) 1 g in dextrose 5 % 50 mL IVPB     1 g 100 mL/hr over 30 Minutes Intravenous Every 24  hours 05/07/17 0040     05/07/17 0030  metroNIDAZOLE (FLAGYL) tablet 500 mg  Status:  Discontinued     500 mg Oral Once 05/07/17 0028 05/07/17 0039   05/07/17 0000  vancomycin (VANCOCIN) IVPB 1000 mg/200 mL premix     1,000 mg 200 mL/hr over 60 Minutes Intravenous  Once 05/06/17 2348 05/07/17 0100   05/06/17 2230  ceFEPIme (MAXIPIME) 2 g in dextrose 5 % 50 mL IVPB     2 g 100 mL/hr over 30 Minutes Intravenous STAT 05/06/17 2224  05/06/17 2310       Objective: Vitals:   05/09/17 2012 05/09/17 2200 05/10/17 0527 05/10/17 1517  BP: (!) 88/67 97/60 (!) 91/56 (!) 89/56  Pulse: 93  96 87  Resp: 20  20 18   Temp: 98 F (36.7 C)  98.1 F (36.7 C) 97.8 F (36.6 C)  TempSrc: Oral  Oral Oral  SpO2: 100%  99% 100%  Weight:   58.6 kg (129 lb 3 oz)   Height:        Intake/Output Summary (Last 24 hours) at 05/10/17 1717 Last data filed at 05/10/17 1400  Gross per 24 hour  Intake          3769.99 ml  Output                8 ml  Net          3761.99 ml   Filed Weights   05/08/17 0500 05/09/17 0631 05/10/17 0527  Weight: 55.9 kg (123 lb 3.8 oz) 57.3 kg (126 lb 5.2 oz) 58.6 kg (129 lb 3 oz)    Examination:  General: Pt is alert, awake, not in acute distress Cardiovascular: RRR, S1/S2 +, no rubs, no gallops Respiratory: CTA bilaterally, no wheezing, no rhonchi Abdominal: Soft, NT, ND, bowel sounds + Extremities: no edema, no cyanosis  Data Reviewed: I have personally reviewed following labs and imaging studies  CBC:  Recent Labs Lab 05/06/17 2122 05/06/17 2255 05/07/17 1351 05/08/17 0508 05/09/17 0036 05/10/17 0351  WBC 18.3*  --  17.7* 14.7* 15.6* 6.0  NEUTROABS 10.9*  --   --  9.8* 8.8*  --   HGB 11.8* 11.2* 11.6* 10.0* 9.5* 13.5  HCT 35.1* 33.0* 35.0* 30.1* 28.2* 39.1  MCV 77.0*  --  78.0 77.0* 76.0* 75.8*  PLT 451*  --  352 338 296 518*   Basic Metabolic Panel:  Recent Labs Lab 05/06/17 2122 05/06/17 2255 05/07/17 1351 05/08/17 0508 05/09/17 0036 05/10/17 0351  NA 137 143 141 141 140 135  K 3.4* 2.9* 2.8* 2.7* 2.4* 3.5  CL 115* 118* 121* 123* 121* 116*  CO2 9*  --  9* 8* 11* 12*  GLUCOSE 94 84 93 75 125* 142*  BUN 27* 25* 20 14 10 8   CREATININE 1.84* 1.70* 1.34* 1.29* 1.12* 0.95  CALCIUM 8.6*  --  7.8* 7.9* 8.1* 7.5*  MG  --   --   --  1.4* 1.9  --    GFR: Estimated Creatinine Clearance: 49.3 mL/min (by C-G formula based on SCr of 0.95 mg/dL). Liver Function Tests:  Recent  Labs Lab 05/06/17 2122 05/08/17 0508  AST 47* 28  ALT 33 22  ALKPHOS 92 79  BILITOT 1.0 0.3  PROT 8.2* 6.8  ALBUMIN 3.3* 2.7*   No results for input(s): LIPASE, AMYLASE in the last 168 hours. No results for input(s): AMMONIA in the last 168 hours. Coagulation Profile:  Recent Labs Lab 05/06/17 2133  INR 1.18  Cardiac Enzymes:  Recent Labs Lab 05/07/17 0245 05/07/17 1151  TROPONINI <0.03 <0.03   BNP (last 3 results) No results for input(s): PROBNP in the last 8760 hours. HbA1C: No results for input(s): HGBA1C in the last 72 hours. CBG:  Recent Labs Lab 05/07/17 2101 05/08/17 0732 05/08/17 0813 05/09/17 0756 05/10/17 0749  GLUCAP 74 62* 135* 109* 135*   Lipid Profile: No results for input(s): CHOL, HDL, LDLCALC, TRIG, CHOLHDL, LDLDIRECT in the last 72 hours. Thyroid Function Tests: No results for input(s): TSH, T4TOTAL, FREET4, T3FREE, THYROIDAB in the last 72 hours. Anemia Panel: No results for input(s): VITAMINB12, FOLATE, FERRITIN, TIBC, IRON, RETICCTPCT in the last 72 hours. Sepsis Labs:  Recent Labs Lab 05/06/17 2255 05/07/17 0029 05/07/17 0245 05/07/17 1351 05/09/17 0036 05/09/17 2109  PROCALCITON  --   --   --  0.73 0.41  --   LATICACIDVEN 1.78 1.63 2.0*  --   --  1.2    Recent Results (from the past 240 hour(s))  Blood Culture (routine x 2)     Status: None (Preliminary result)   Collection Time: 05/06/17  9:22 PM  Result Value Ref Range Status   Specimen Description BLOOD LEFT ANTECUBITAL  Final   Special Requests   Final    BOTTLES DRAWN AEROBIC AND ANAEROBIC Blood Culture adequate volume   Culture   Final    NO GROWTH 3 DAYS Performed at [facility], [place] [address], [place], [place] [address]    Report Status PENDING  Incomplete  Blood Culture (routine x 2)     Status: None (Preliminary result)   Collection Time: 05/06/17 10:50 PM  Result Value Ref Range Status   Specimen Description BLOOD R UPPER ARM  Final   Special  Requests IN PEDIATRIC BOTTLE Blood Culture adequate volume  Final   Culture   Final    NO GROWTH 3 DAYS Performed at [facility], [place] [address] [place], [place] [address]    Report Status PENDING  Incomplete  Urine culture     Status: Abnormal   Collection Time: 05/06/17 11:42 PM  Result Value Ref Range Status   Specimen Description URINE, RANDOM  Final   Special Requests NONE  Final   Culture MULTIPLE SPECIES PRESENT, SUGGEST RECOLLECTION (A)  Final   Report Status 05/08/2017 FINAL  Final  MRSA PCR Screening     Status: Abnormal   Collection Time: 05/07/17  4:41 AM  Result Value Ref Range Status   MRSA by PCR POSITIVE (A) NEGATIVE Final    Comment:        The GeneXpert MRSA Assay (FDA approved for NASAL specimens only), is one component of a comprehensive MRSA colonization surveillance program. It is not intended to diagnose MRSA infection nor to guide or monitor treatment for MRSA infections. RESULT CALLED TO, READ BACK BY AND VERIFIED WITH: DARK, D. RN @0900  ON 6.1.18 BY [email or web address]   C difficile quick scan w PCR reflex     Status: None   Collection Time: 05/07/17  9:03 AM  Result Value Ref Range Status   C Diff antigen NEGATIVE NEGATIVE Final   C Diff toxin NEGATIVE NEGATIVE Final   C Diff interpretation No C. difficile detected.  Final      Radiology Studies: No results found.  Scheduled Meds: . calcium-vitamin D  1 tablet Oral Daily  . Chlorhexidine Gluconate Cloth  6 each Topical Q0600  . cyanocobalamin  1,000 mcg Intramuscular Q30 days  . diphenoxylate-atropine  2 tablet Oral BID  . feeding supplement (ENSURE ENLIVE)  237 mL Oral BID BM  . ferrous sulfate  325 mg Oral TID WC  . liver oil-zinc oxide  1 application Topical BID  . multivitamin with minerals  1 tablet Oral Daily  . mupirocin ointment  1 application Nasal BID  . potassium chloride  40 mEq Oral BID  . predniSONE  40 mg Oral Q breakfast  . psyllium  1 packet Oral Daily  . saccharomyces  boulardii  250 mg Oral Daily  . sodium chloride flush  3 mL Intravenous Q12H   Continuous Infusions: . cefTRIAXone (ROCEPHIN)  IV Stopped (05/09/17 2025)  .  sodium bicarbonate  infusion 1000 mL 100 mL/hr at 05/10/17 1228     LOS: 3 days    Chipper Oman, MD Pager: Text Page via [email or web address]  [phone]  If 7PM-7AM, please contact night-coverage [email or web address] Password [email or web address] 05/10/2017, 5:17 PM

## 2017-05-10 NOTE — Progress Notes (Signed)
SLP Cancellation Note  Patient Details Name: Danielle [name] MRN: [id number] DOB: 02-17-1946   Cancelled treatment:       Reason Eval/Treat Not Completed: Other (comment) (pt politely requested SlP to return later as she is getting ready to be cleaned up)   Claudie Fisherman, [place] [facility] SLP [phone]

## 2017-05-10 NOTE — Progress Notes (Signed)
CC:  SOB/Generalized weakness  Subjective: Pt scheduled to see Dr. Marcello Moores tomorrow in the office.  The overriding issue is ongoing incontinence with stool via the vagina.  He skin is wet and contaminated with feces.  The UTI is Multi species.  Her perirectal abscess is still there about 7 cm deep.  No drainage.  Whole buttocks is covered in stool/urine drainage.  Objective: Vital signs in last 24 hours: Temp:  [98 F (36.7 C)-98.4 F (36.9 C)] 98.1 F (36.7 C) (06/04 0527) Pulse Rate:  [93-103] 96 (06/04 0527) Resp:  [20] 20 (06/04 0527) BP: (88-103)/(56-67) 91/56 (06/04 0527) SpO2:  [99 %-100 %] 99 % (06/04 0527) Weight:  [58.6 kg (129 lb 3 oz)] 58.6 kg (129 lb 3 oz) (06/04 0527) Last BM Date: 05/09/17 600 PO 2800 IV Voided x 4 Stool x 7 Afebrile, BP is low 90's-100 range K+ 3.5 mag 1.9 yesterday Glucose 142 CT yesterday:  1. No acute finding.  Negative for pneumonia. 2. Chronic large hiatal hernia containing stomach and small bowel.    Intake/Output from previous day: 06/03 0701 - 06/04 0700 In: 3380 [P.O.:600; I.V.:2330; IV Piggyback:450] Out: 11 [Urine:4; Stool:7] Intake/Output this shift: No intake/output data recorded.  General appearance: alert, cooperative and no distress Resp: clear to auscultation bilaterally GI: soft, non-tender; bowel sounds normal; no masses,  no organomegaly Pelvic: She has ongoing drainage from her vagina, stool/urine.  The skin is in fair shape, but getting red again.  the perirectal abscess is not packed or draining.  It is 7 cm deep at this point.  Lab Results:   Recent Labs  05/09/17 0036 05/10/17 0351  WBC 15.6* 6.0  HGB 9.5* 13.5  HCT 28.2* 39.1  PLT 296 142*    BMET  Recent Labs  05/09/17 0036 05/10/17 0351  NA 140 135  K 2.4* 3.5  CL 121* 116*  CO2 11* 12*  GLUCOSE 125* 142*  BUN 10 8  CREATININE 1.12* 0.95  CALCIUM 8.1* 7.5*   PT/INR No results for input(s): LABPROT, INR in the last 72  hours.   Recent Labs Lab 05/06/17 2122 05/08/17 0508  AST 47* 28  ALT 33 22  ALKPHOS 92 79  BILITOT 1.0 0.3  PROT 8.2* 6.8  ALBUMIN 3.3* 2.7*     Lipase     Component Value Date/Time   LIPASE 20 02/03/2017 0310     Medications: . calcium-vitamin D  1 tablet Oral Daily  . Chlorhexidine Gluconate Cloth  6 each Topical Q0600  . cyanocobalamin  1,000 mcg Intramuscular Q30 days  . diphenoxylate-atropine  2 tablet Oral BID  . feeding supplement (ENSURE ENLIVE)  237 mL Oral BID BM  . ferrous sulfate  325 mg Oral TID WC  . liver oil-zinc oxide  1 application Topical BID  . multivitamin with minerals  1 tablet Oral Daily  . mupirocin ointment  1 application Nasal BID  . potassium chloride  40 mEq Oral BID  . predniSONE  40 mg Oral Q breakfast  . psyllium  1 packet Oral Daily  . saccharomyces boulardii  250 mg Oral Daily  . sodium chloride flush  3 mL Intravenous Q12H   . cefTRIAXone (ROCEPHIN)  IV Stopped (05/09/17 2025)  .  sodium bicarbonate  infusion 1000 mL 100 mL/hr at 05/10/17 0225   Anti-infectives    Start     Dose/Rate Route Frequency Ordered Stop   05/07/17 2359  vancomycin (VANCOCIN) 500 mg in sodium chloride 0.9 %  100 mL IVPB  Status:  Discontinued     500 mg 100 mL/hr over 60 Minutes Intravenous Every 24 hours 05/07/17 0040 05/07/17 0051   05/07/17 2200  ceFEPIme (MAXIPIME) 2 g in dextrose 5 % 50 mL IVPB  Status:  Discontinued     2 g 100 mL/hr over 30 Minutes Intravenous Every 24 hours 05/06/17 2254 05/07/17 0028   05/07/17 2200  ceFEPIme (MAXIPIME) 1 g in dextrose 5 % 50 mL IVPB  Status:  Discontinued     1 g 100 mL/hr over 30 Minutes Intravenous Every 24 hours 05/07/17 0028 05/07/17 0040   05/07/17 2000  cefTRIAXone (ROCEPHIN) 1 g in dextrose 5 % 50 mL IVPB     1 g 100 mL/hr over 30 Minutes Intravenous Every 24 hours 05/07/17 0040     05/07/17 0030  metroNIDAZOLE (FLAGYL) tablet 500 mg  Status:  Discontinued     500 mg Oral Once 05/07/17 0028 05/07/17  0039   05/07/17 0000  vancomycin (VANCOCIN) IVPB 1000 mg/200 mL premix     1,000 mg 200 mL/hr over 60 Minutes Intravenous  Once 05/06/17 2348 05/07/17 0100   05/06/17 2230  ceFEPIme (MAXIPIME) 2 g in dextrose 5 % 50 mL IVPB     2 g 100 mL/hr over 30 Minutes Intravenous STAT 05/06/17 2224 05/06/17 2310       Assessment/Plan  Acute respiratory hypoxia with sepsis secondary to UTI (urine culture - Multiple species) Hospitalized from 5/16-5/24/18: Perirectal abscess/Crohn's disease 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 Marcello Moores recurrent,04/2017 or ? colo vesicular fistula now  I&D perirectal abscess 04/21/17, Dr. Ninfa Linden Hiatal hernia with stomach and bowel Chronic hypokalemia - resolved Hypomagnesemia - resolved Tachycardia - stable  Probable malnutrition/Deconditioning Diarrhea - C diff negative 04/28/17 Sepsis with aspiration pneumonia - 12/2015 Chronic kidney disease Prior CVA  Schatzki ring  FEN: IV fluids/DIII diet ID:  Cefepime 5/31, Rocephin 05/07/17 - 05/09/17 DVT:  SCD   Plan:  I will let Dr. Marcello Moores know about patient.  I looked at her office notes during the last admit and she was concerned that further resection would lead to short bowel syndrome and lifetime TNA.  I have ask the nursing staff to contact wound care.  Her skin is in jeopardy due to the constant contamination.  It was improving when she went home 04/29/17 but on exam today that is no longer the case.  I will have them put her in the shower/sitz bath and continue dressing to perirectal site just to help keep it open and clean.  Add LFT's and prealbumin to labs in AM.       LOS: 3 days    Hadi Dubin 05/10/2017 [phone]

## 2017-05-11 LAB — HEPATIC FUNCTION PANEL
ALK PHOS: 66 U/L (ref 38–126)
ALT: 17 U/L (ref 14–54)
AST: 18 U/L (ref 15–41)
Albumin: 2.3 g/dL — ABNORMAL LOW (ref 3.5–5.0)
BILIRUBIN DIRECT: 0.1 mg/dL (ref 0.1–0.5)
BILIRUBIN TOTAL: 0.4 mg/dL (ref 0.3–1.2)
Indirect Bilirubin: 0.3 mg/dL (ref 0.3–0.9)
Total Protein: 5.7 g/dL — ABNORMAL LOW (ref 6.5–8.1)

## 2017-05-11 LAB — BASIC METABOLIC PANEL
ANION GAP: 7 (ref 5–15)
BUN: 9 mg/dL (ref 6–20)
CO2: 15 mmol/L — ABNORMAL LOW (ref 22–32)
Calcium: 7.4 mg/dL — ABNORMAL LOW (ref 8.9–10.3)
Chloride: 113 mmol/L — ABNORMAL HIGH (ref 101–111)
Creatinine, Ser: 0.98 mg/dL (ref 0.44–1.00)
GFR calc Af Amer: >60 mL/min (ref >60)
GFR calc non Af Amer: 58 mL/min — ABNORMAL LOW (ref >60)
GLUCOSE: 113 mg/dL — AB (ref 65–99)
POTASSIUM: 3.5 mmol/L (ref 3.5–5.1)
Sodium: 135 mmol/L (ref 135–145)

## 2017-05-11 LAB — CBC
HCT: 24.6 % — ABNORMAL LOW (ref 36.0–46.0)
Hemoglobin: 8.3 g/dL — ABNORMAL LOW (ref 12.0–15.0)
MCH: 25.8 pg — AB (ref 26.0–34.0)
MCHC: 33.7 g/dL (ref 30.0–36.0)
MCV: 76.4 fL — AB (ref 78.0–100.0)
Platelets: 213 10*3/uL (ref 150–400)
RBC: 3.22 MIL/uL — ABNORMAL LOW (ref 3.87–5.11)
RDW: 18.8 % — AB (ref 11.5–15.5)
WBC: 14.8 10*3/uL — ABNORMAL HIGH (ref 4.0–10.5)

## 2017-05-11 LAB — PROCALCITONIN: Procalcitonin: 0.19 ng/mL

## 2017-05-11 LAB — OCCULT BLOOD X 1 CARD TO LAB, STOOL: Fecal Occult Bld: POSITIVE — AB

## 2017-05-11 LAB — GLUCOSE, CAPILLARY: Glucose-Capillary: 101 mg/dL — ABNORMAL HIGH (ref 65–99)

## 2017-05-11 LAB — HEMOGLOBIN AND HEMATOCRIT, BLOOD
HCT: 25.7 % — ABNORMAL LOW (ref 36.0–46.0)
HEMOGLOBIN: 8.7 g/dL — AB (ref 12.0–15.0)

## 2017-05-11 LAB — PREALBUMIN: Prealbumin: 12.6 mg/dL — ABNORMAL LOW (ref 18–38)

## 2017-05-11 MED ORDER — FENTANYL CITRATE (PF) 100 MCG/2ML IJ SOLN
25.0000 ug | Freq: Once | INTRAMUSCULAR | Status: AC
  Administered 2017-05-11: 25 ug via INTRAVENOUS
  Filled 2017-05-11: qty 2

## 2017-05-11 MED ORDER — SODIUM BICARBONATE 650 MG PO TABS
650.0000 mg | ORAL_TABLET | Freq: Two times a day (BID) | ORAL | Status: DC
  Administered 2017-05-11 – 2017-05-14 (×7): 650 mg via ORAL
  Filled 2017-05-11 (×7): qty 1

## 2017-05-11 MED ORDER — METHYLPREDNISOLONE SODIUM SUCC 125 MG IJ SOLR
60.0000 mg | INTRAMUSCULAR | Status: DC
  Administered 2017-05-11 – 2017-05-13 (×3): 60 mg via INTRAVENOUS
  Filled 2017-05-11 (×3): qty 2

## 2017-05-11 MED ORDER — DIPHENOXYLATE-ATROPINE 2.5-0.025 MG PO TABS
2.0000 | ORAL_TABLET | Freq: Four times a day (QID) | ORAL | Status: DC
  Administered 2017-05-11 – 2017-05-13 (×7): 2 via ORAL
  Filled 2017-05-11 (×7): qty 2

## 2017-05-11 MED ORDER — POTASSIUM CHLORIDE 2 MEQ/ML IV SOLN
INTRAVENOUS | Status: DC
  Administered 2017-05-11 – 2017-05-13 (×4): via INTRAVENOUS
  Filled 2017-05-11 (×6): qty 1000

## 2017-05-11 MED ORDER — MAGIC MOUTHWASH W/LIDOCAINE
5.0000 mL | Freq: Four times a day (QID) | ORAL | Status: DC
  Administered 2017-05-11 – 2017-05-14 (×9): 5 mL via ORAL
  Filled 2017-05-11 (×15): qty 5

## 2017-05-11 MED ORDER — COLESTIPOL HCL 1 G PO TABS
2.0000 g | ORAL_TABLET | Freq: Two times a day (BID) | ORAL | Status: DC
  Administered 2017-05-13 (×2): 2 g via ORAL
  Filled 2017-05-11 (×7): qty 2

## 2017-05-11 NOTE — Progress Notes (Signed)
  Wound care, per orders; pt tolerated well; drsg clean dry intact

## 2017-05-11 NOTE — Progress Notes (Signed)
CC:  SOB/generalized weakness  Subjective: She feels better, loose stools a little better also.  No abdominal pain.   She just had the rectal abscess repacked, and they are doing sitz baths.   Objective: Vital signs in last 24 hours: Temp:  [97.8 F (36.6 C)-98.3 F (36.8 C)] 98.3 F (36.8 C) (06/05 0611) Pulse Rate:  [87-95] 95 (06/05 0611) Resp:  [12-20] 12 (06/05 0611) BP: (89-100)/(55-59) 99/55 (06/05 0611) SpO2:  [99 %-100 %] 99 % (06/05 0611) Weight:  [57.9 kg (127 lb 10.3 oz)] 57.9 kg (127 lb 10.3 oz) (06/05 0500) Last BM Date: 05/10/17 PO not recorded Voided x 5 Stools x 4 Afebrile, VSS, BP is on the low end of normal K+ 3.5 Pre albumin 12.6 WBC is up again Intake/Output from previous day: 06/04 0701 - 06/05 0700 In: 2205.4 [I.V.:2155.4; IV Piggyback:50] Out: 4 [Stool:4] Intake/Output this shift: Total I/O In: 480 [P.O.:480] Out: -   General appearance: alert, cooperative and no distress Resp: clear to auscultation bilaterally GI: soft, non-tender; bowel sounds normal; no masses,  no organomegaly  Lab Results:   Recent Labs  05/10/17 0351 05/11/17 0528  WBC 6.0 14.8*  HGB 13.5 8.3*  HCT 39.1 24.6*  PLT 142* 213    BMET  Recent Labs  05/10/17 0351 05/11/17 0528  NA 135 135  K 3.5 3.5  CL 116* 113*  CO2 12* 15*  GLUCOSE 142* 113*  BUN 8 9  CREATININE 0.95 0.98  CALCIUM 7.5* 7.4*   PT/INR No results for input(s): LABPROT, INR in the last 72 hours.   Recent Labs Lab 05/06/17 2122 05/08/17 0508 05/11/17 0528  AST 47* 28 18  ALT 33 22 17  ALKPHOS 92 79 66  BILITOT 1.0 0.3 0.4  PROT 8.2* 6.8 5.7*  ALBUMIN 3.3* 2.7* 2.3*     Lipase     Component Value Date/Time   LIPASE 20 02/03/2017 0310     Medications: . calcium-vitamin D  1 tablet Oral Daily  . Chlorhexidine Gluconate Cloth  6 each Topical Q0600  . cyanocobalamin  1,000 mcg Intramuscular Q30 days  . diphenoxylate-atropine  2 tablet Oral BID  . feeding supplement  (ENSURE ENLIVE)  237 mL Oral BID BM  . ferrous sulfate  325 mg Oral TID WC  . liver oil-zinc oxide  1 application Topical BID  . methylPREDNISolone (SOLU-MEDROL) injection  60 mg Intravenous Q24H  . multivitamin with minerals  1 tablet Oral Daily  . mupirocin ointment  1 application Nasal BID  . potassium chloride  40 mEq Oral BID  . psyllium  1 packet Oral Daily  . saccharomyces boulardii  250 mg Oral Daily  . sodium bicarbonate  650 mg Oral BID  . sodium chloride flush  3 mL Intravenous Q12H   . cefTRIAXone (ROCEPHIN)  IV Stopped (05/10/17 2130)  .  sodium bicarbonate  infusion 1000 mL 75 mL/hr at 05/11/17 0241   Anti-infectives    Start     Dose/Rate Route Frequency Ordered Stop   05/07/17 2359  vancomycin (VANCOCIN) 500 mg in sodium chloride 0.9 % 100 mL IVPB  Status:  Discontinued     500 mg 100 mL/hr over 60 Minutes Intravenous Every 24 hours 05/07/17 0040 05/07/17 0051   05/07/17 2200  ceFEPIme (MAXIPIME) 2 g in dextrose 5 % 50 mL IVPB  Status:  Discontinued     2 g 100 mL/hr over 30 Minutes Intravenous Every 24 hours 05/06/17 2254 05/07/17 0028  05/07/17 2200  ceFEPIme (MAXIPIME) 1 g in dextrose 5 % 50 mL IVPB  Status:  Discontinued     1 g 100 mL/hr over 30 Minutes Intravenous Every 24 hours 05/07/17 0028 05/07/17 0040   05/07/17 2000  cefTRIAXone (ROCEPHIN) 1 g in dextrose 5 % 50 mL IVPB     1 g 100 mL/hr over 30 Minutes Intravenous Every 24 hours 05/07/17 0040     05/07/17 0030  metroNIDAZOLE (FLAGYL) tablet 500 mg  Status:  Discontinued     500 mg Oral Once 05/07/17 0028 05/07/17 0039   05/07/17 0000  vancomycin (VANCOCIN) IVPB 1000 mg/200 mL premix     1,000 mg 200 mL/hr over 60 Minutes Intravenous  Once 05/06/17 2348 05/07/17 0100   05/06/17 2230  ceFEPIme (MAXIPIME) 2 g in dextrose 5 % 50 mL IVPB     2 g 100 mL/hr over 30 Minutes Intravenous STAT 05/06/17 2224 05/06/17 2310        Assessment/Plan Acute respiratory hypoxia with sepsis secondary to UTI (urine  culture - Multiple species) Hospitalized from 5/16-5/24/18: Perirectal abscess/Crohn's disease 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 Marcello Moores recurrent,04/2017 - sx worse with loose stools I&D perirectal abscess 04/21/17, Dr. Ninfa Linden Hiatal hernia with stomach and bowel Chronic hypokalemia - resolved Hypomagnesemia - resolved Tachycardia - stable  Probable malnutrition/Deconditioning Diarrhea - C diff negative 04/28/17 Sepsis with aspiration pneumonia - 12/2015 Chronic kidney disease Prior CVA  Schatzki ring  FEN: IV fluids/DIII diet ID:  Cefepime 5/31, Rocephin 05/07/17 =>> day 5 DVT:  SCD  Plan:  Discussed with Dr. Quincy Simmonds, he is calling GI, but she needs Crohn's and loose stools controlled.  She can follow up with Dr. Marcello Moores in 1-2 weeks after discharge.      LOS: 4 days    Danielle [name] 05/11/2017 [phone]

## 2017-05-11 NOTE — Progress Notes (Signed)
SLP Cancellation Note  Patient Details Name: Danielle [name] MRN: [id number] DOB: June 22, 1946   Cancelled treatment:       Reason Eval/Treat Not Completed: Other (comment) (pt receiving care from RN when SLP attempted to see her, suspect tolerating po well given 100% intake, will follow up x1)   Claudie Fisherman, [place] [facility] SLP [phone]

## 2017-05-11 NOTE — Progress Notes (Signed)
PROGRESS NOTE [facility]   Danielle [name]   ZOX:[id number] DOB: 05/08/1946  DOA: 05/06/2017 PCP: Iona Beard, MD   Brief Narrative:  Patient is a 71 year old female with medical history significant for Crohn's disease, chronic kidney disease stage III, history of rectovaginal fistula and rectal abscess that was recently drained presented to the emergency department with shortness of breath, generalized weakness, dysuria and burning on urination. Patient was admitted for suspected sepsis with hypoxemia and acute renal failure. She had a positive d-dimer and VQ scan was done which showed low probability for pulmonary embolism. Doppler of the lower extremity was negative for DVT. Heparin drip was discontinued, she was found to have metabolic acidosis, which was leading to tachypnea. Bicarb ggt was started with improvement of her respiratory status, now trying to control her diarrhea from Crohn's she has been started on steroids. GI has been consulted   Subjective: Patient seen and examined despite prednisone she continues to have frequent bowel movements. Denies SOB, chest pain and palpitations.   Assessment & Plan: Acute respiratory with hypoxia - Multifactorial from sepsis 2/2 UTI and metabolic acidosis from severe diarrhea, inducing hyperventilation. - Resolved  PE  Unlikely - initially treated with heparin ggt which was d/ced on 05/07/17 Treating acidosis with bicarbonate drip, added Bicarb tablets will d/c ggt today Chest CT with no acute abnormalities Off oxygen now   Sepsis 2/2 UTI - sepsis physiology has resolved  On Rocephin will complete 7 day of abx - on day 5 Urine culture multiple species, will not repeat given patient on antibiotics Blood culture thus far negative  Metabolic acidosis secondary to severe diarrhea from Crohn's disease HCO3 continues to improve currently 15 Initially treated with Bicarb ggt - transitioned to Bicarb tablets  D/c Mag as probably  contributing to diarrhea.   Hypokalemia secondary to diarrhea - Improved, although borderline  Continue oral K  Adding KCL to IVF  Check BMP in AM   Acute kidney failure on chronic kidney disease stage III - Improved and stable  Creatinine remission 1.70. Current Cr 0.98 Patient on IVF given diarrhea and acidosis  Check BMP in AM   Crohn's disease - Flare Patient started prednisone 40 mg, no significant improvement will start solumedrol 60 mg daily  Continue Loperamide, Florastor and Lomotil.  Continue fiber  GI consulted  C diff negative     Perirectal abscess, recently the drain x 2  Wound examined, now draining purulent discharge. Surgery consulted - appreciate recommendations, recommending to control diarrhea and follow up as outpatient.   Anemia of chronic disease  Hgb has been trending down, likely from crohn's flare  FOBT positive  Transfuse if Hgb < 7 or symptomatic  Monitor CBC in AM   DVT prophylaxis: SCD's  Code Status: FULL  Family Communication: None at bedside  Disposition Plan: Unable to determine at this time   Consultants:   None   Procedures:   None   Antimicrobials: Anti-infectives    Start     Dose/Rate Route Frequency Ordered Stop   05/07/17 2359  vancomycin (VANCOCIN) 500 mg in sodium chloride 0.9 % 100 mL IVPB  Status:  Discontinued     500 mg 100 mL/hr over 60 Minutes Intravenous Every 24 hours 05/07/17 0040 05/07/17 0051   05/07/17 2200  ceFEPIme (MAXIPIME) 2 g in dextrose 5 % 50 mL IVPB  Status:  Discontinued     2 g 100 mL/hr over 30 Minutes Intravenous Every 24 hours 05/06/17 2254 05/07/17 0028  05/07/17 2200  ceFEPIme (MAXIPIME) 1 g in dextrose 5 % 50 mL IVPB  Status:  Discontinued     1 g 100 mL/hr over 30 Minutes Intravenous Every 24 hours 05/07/17 0028 05/07/17 0040   05/07/17 2000  cefTRIAXone (ROCEPHIN) 1 g in dextrose 5 % 50 mL IVPB     1 g 100 mL/hr over 30 Minutes Intravenous Every 24 hours 05/07/17 0040     05/07/17 0030   metroNIDAZOLE (FLAGYL) tablet 500 mg  Status:  Discontinued     500 mg Oral Once 05/07/17 0028 05/07/17 0039   05/07/17 0000  vancomycin (VANCOCIN) IVPB 1000 mg/200 mL premix     1,000 mg 200 mL/hr over 60 Minutes Intravenous  Once 05/06/17 2348 05/07/17 0100   05/06/17 2230  ceFEPIme (MAXIPIME) 2 g in dextrose 5 % 50 mL IVPB     2 g 100 mL/hr over 30 Minutes Intravenous STAT 05/06/17 2224 05/06/17 2310       Objective: Vitals:   05/10/17 1517 05/10/17 2057 05/11/17 0500 05/11/17 0611  BP: (!) 89/56 (!) 100/59  (!) 99/55  Pulse: 87 91  95  Resp: 18 20  12   Temp: 97.8 F (36.6 C) 97.9 F (36.6 C)  98.3 F (36.8 C)  TempSrc: Oral Oral  Oral  SpO2: 100% 100%  99%  Weight:   57.9 kg (127 lb 10.3 oz)   Height:        Intake/Output Summary (Last 24 hours) at 05/11/17 1309 Last data filed at 05/11/17 0900  Gross per 24 hour  Intake          2215.42 ml  Output                0 ml  Net          2215.42 ml   Filed Weights   05/09/17 0631 05/10/17 0527 05/11/17 0500  Weight: 57.3 kg (126 lb 5.2 oz) 58.6 kg (129 lb 3 oz) 57.9 kg (127 lb 10.3 oz)    Examination:  General: Pt is alert, awake, not in acute distress Cardiovascular: RRR, S1/S2 +, no rubs, no gallops Respiratory: CTA bilaterally, no wheezing, no rhonchi Abdominal: Soft, NT, ND, bowel sounds + Extremities: no edema, no cyanosis  Data Reviewed: I have personally reviewed following labs and imaging studies  CBC:  Recent Labs Lab 05/06/17 2122  05/07/17 1351 05/08/17 0508 05/09/17 0036 05/10/17 0351 05/11/17 0528 05/11/17 1031  WBC 18.3*  --  17.7* 14.7* 15.6* 6.0 14.8*  --   NEUTROABS 10.9*  --   --  9.8* 8.8*  --   --   --   HGB 11.8*  < > 11.6* 10.0* 9.5* 13.5 8.3* 8.7*  HCT 35.1*  < > 35.0* 30.1* 28.2* 39.1 24.6* 25.7*  MCV 77.0*  --  78.0 77.0* 76.0* 75.8* 76.4*  --   PLT 451*  --  352 338 296 142* 213  --   < > = values in this interval not displayed. Basic Metabolic Panel:  Recent Labs Lab  05/07/17 1351 05/08/17 0508 05/09/17 0036 05/10/17 0351 05/11/17 0528  NA 141 141 140 135 135  K 2.8* 2.7* 2.4* 3.5 3.5  CL 121* 123* 121* 116* 113*  CO2 9* 8* 11* 12* 15*  GLUCOSE 93 75 125* 142* 113*  BUN 20 14 10 8 9   CREATININE 1.34* 1.29* 1.12* 0.95 0.98  CALCIUM 7.8* 7.9* 8.1* 7.5* 7.4*  MG  --  1.4* 1.9  --   --  GFR: Estimated Creatinine Clearance: 47.8 mL/min (by C-G formula based on SCr of 0.98 mg/dL). Liver Function Tests:  Recent Labs Lab 05/06/17 2122 05/08/17 0508 05/11/17 0528  AST 47* 28 18  ALT 33 22 17  ALKPHOS 92 79 66  BILITOT 1.0 0.3 0.4  PROT 8.2* 6.8 5.7*  ALBUMIN 3.3* 2.7* 2.3*   No results for input(s): LIPASE, AMYLASE in the last 168 hours. No results for input(s): AMMONIA in the last 168 hours. Coagulation Profile:  Recent Labs Lab 05/06/17 2133  INR 1.18   Cardiac Enzymes:  Recent Labs Lab 05/07/17 0245 05/07/17 1151  TROPONINI <0.03 <0.03   BNP (last 3 results) No results for input(s): PROBNP in the last 8760 hours. HbA1C: No results for input(s): HGBA1C in the last 72 hours. CBG:  Recent Labs Lab 05/08/17 0732 05/08/17 0813 05/09/17 0756 05/10/17 0749 05/11/17 0802  GLUCAP 62* 135* 109* 135* 101*   Lipid Profile: No results for input(s): CHOL, HDL, LDLCALC, TRIG, CHOLHDL, LDLDIRECT in the last 72 hours. Thyroid Function Tests: No results for input(s): TSH, T4TOTAL, FREET4, T3FREE, THYROIDAB in the last 72 hours. Anemia Panel: No results for input(s): VITAMINB12, FOLATE, FERRITIN, TIBC, IRON, RETICCTPCT in the last 72 hours. Sepsis Labs:  Recent Labs Lab 05/06/17 2255 05/07/17 0029 05/07/17 0245 05/07/17 1351 05/09/17 0036 05/09/17 2109 05/11/17 0528  PROCALCITON  --   --   --  0.73 0.41  --  0.19  LATICACIDVEN 1.78 1.63 2.0*  --   --  1.2  --     Recent Results (from the past 240 hour(s))  Blood Culture (routine x 2)     Status: None (Preliminary result)   Collection Time: 05/06/17  9:22 PM  Result  Value Ref Range Status   Specimen Description BLOOD LEFT ANTECUBITAL  Final   Special Requests   Final    BOTTLES DRAWN AEROBIC AND ANAEROBIC Blood Culture adequate volume   Culture   Final    NO GROWTH 4 DAYS Performed at [facility], [place] [address], [place], [place] [address]    Report Status PENDING  Incomplete  Blood Culture (routine x 2)     Status: None (Preliminary result)   Collection Time: 05/06/17 10:50 PM  Result Value Ref Range Status   Specimen Description BLOOD R UPPER ARM  Final   Special Requests IN PEDIATRIC BOTTLE Blood Culture adequate volume  Final   Culture   Final    NO GROWTH 4 DAYS Performed at [facility], [place] [address], [place],  [address]    Report Status PENDING  Incomplete  Urine culture     Status: Abnormal   Collection Time: 05/06/17 11:42 PM  Result Value Ref Range Status   Specimen Description URINE, RANDOM  Final   Special Requests NONE  Final   Culture MULTIPLE SPECIES PRESENT, SUGGEST RECOLLECTION (A)  Final   Report Status 05/08/2017 FINAL  Final  MRSA PCR Screening     Status: Abnormal   Collection Time: 05/07/17  4:41 AM  Result Value Ref Range Status   MRSA by PCR POSITIVE (A) NEGATIVE Final    Comment:        The GeneXpert MRSA Assay (FDA approved for NASAL specimens only), is one component of a comprehensive MRSA colonization surveillance program. It is not intended to diagnose MRSA infection nor to guide or monitor treatment for MRSA infections. RESULT CALLED TO, READ BACK BY AND VERIFIED WITH: DARK, D. RN @0900  ON 6.1.18 BY  [email or web address]   C difficile quick scan w PCR reflex     Status: None   Collection Time: 05/07/17  9:03 AM  Result Value Ref Range Status   C Diff antigen NEGATIVE NEGATIVE Final   C Diff toxin NEGATIVE NEGATIVE Final   C Diff interpretation No C. difficile detected.  Final      Radiology Studies: No results found.  Scheduled Meds: . calcium-vitamin D  1 tablet Oral Daily  .  Chlorhexidine Gluconate Cloth  6 each Topical Q0600  . cyanocobalamin  1,000 mcg Intramuscular Q30 days  . diphenoxylate-atropine  2 tablet Oral BID  . feeding supplement (ENSURE ENLIVE)  237 mL Oral BID BM  . ferrous sulfate  325 mg Oral TID WC  . liver oil-zinc oxide  1 application Topical BID  . methylPREDNISolone (SOLU-MEDROL) injection  60 mg Intravenous Q24H  . multivitamin with minerals  1 tablet Oral Daily  . mupirocin ointment  1 application Nasal BID  . potassium chloride  40 mEq Oral BID  . psyllium  1 packet Oral Daily  . saccharomyces boulardii  250 mg Oral Daily  . sodium bicarbonate  650 mg Oral BID  . sodium chloride flush  3 mL Intravenous Q12H   Continuous Infusions: . cefTRIAXone (ROCEPHIN)  IV Stopped (05/10/17 2130)  .  sodium bicarbonate  infusion 1000 mL 75 mL/hr at 05/11/17 0241     LOS: 4 days    Chipper Oman, MD Pager: Text Page via [email or web address]  [phone]  If 7PM-7AM, please contact night-coverage [email or web address] Password [email or web address] 05/11/2017, 1:09 PM

## 2017-05-11 NOTE — Consult Note (Signed)
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 African-American female with history of Crohn's disease for over 37 years. She has been maintained on IV Remicade infusions every 6 weeks at [facility]. She has not received Remicade for the last 8 weeks(overdue by 2 weeks). She was admitted on 05/06/2017 with shortness of breath, generalized weakness, burning micturition. She had been admitted from 5/16-5/24 for rectal abscess, underwent I and D of perirectal abscess on 04/21/2017 by Dr. Ninfa Linden. Patient states that the past 2 weeks he has had uncontrolled diarrhea which she describes as frequent, loose, watery, green stool as often as every 20 minutes. She also complains of nocturnal diarrhea. This is not associated with abdominal pain although she has mild nausea without vomiting. She has not noted black stools or blood in the stool. Beside Remicade infusions every 6 weeks,she is on not on any other medication for Crohn's disease.  She denies difficulty swallowing, pain on swallowing, unintentional weight loss, has a decreased appetite. She has been tested for C. difficile on several location and it was negative. Stool tested positive for occult blood today. Despite use of Lomotil 2 tablets(up to 8 tablets per day) and loperamide 4 mg oral as needed, her diarrhea has not resolved. She has also been started on IV Solu-Medrol 60 mg every 24 hours for treatment of Crohn's disease. Her medication list include Metamucil however she states she has not been taking it. She is on Florastor probiotic. Her labs is notable for an elevated CRP of 7.3, ESR of 40, hypokalemia, persistent acidosis with a normal renal  function.  EGD from 03/15/17 showed Schatzki's ring status post balloon dilatation and a large hiatal hernia. Colonoscopy from 03/12/2017 showed erythema in rectum, patent ileocolonic anastomosis, unremarkable terminal ileum.     Past Medical History:  Diagnosis Date  . Crohn disease ([place])   . Hiatal hernia 12/07/2015  . Psoriasis     Past Surgical History:  Procedure Laterality Date  . ANAL STENOSIS  ~ 2010  . APPENDECTOMY  1980's  . BOWEL RESECTION  2009  . CHOLECYSTECTOMY  1990's  . COLONOSCOPY  2013  . COLONOSCOPY WITH PROPOFOL N/A 03/12/2017   Procedure: COLONOSCOPY WITH PROPOFOL;  Surgeon: Rogene Houston, MD;  [facility]: [facility];  Service: Endoscopy;  Laterality: N/A;  2:05-moved up to 1025 per Lelon Frohlich  . COLOSTOMY  2009   DEMASO  . COLOSTOMY TAKEDOWN  2009   "only had it for about 1 month" (08/22/2013)  . ESOPHAGEAL DILATION N/A 03/12/2017   Procedure: ESOPHAGEAL DILATION;  Surgeon: Rogene Houston, MD;  Location: [facility];  Service: Endoscopy;  Laterality: N/A;  . ESOPHAGOGASTRODUODENOSCOPY  11/11/2011   Procedure: ESOPHAGOGASTRODUODENOSCOPY (EGD);  Surgeon: Missy Sabins, MD;  Location: [facility] ENDOSCOPY;  Service: Endoscopy;  Laterality: N/A;  . ESOPHAGOGASTRODUODENOSCOPY  05/18/2012   Procedure: ESOPHAGOGASTRODUODENOSCOPY (EGD);  Surgeon: Rogene Houston, MD;  Location: [facility];  Service: Endoscopy;  Laterality: N/A;  200  . ESOPHAGOGASTRODUODENOSCOPY (EGD) WITH PROPOFOL N/A 03/12/2017   Procedure: ESOPHAGOGASTRODUODENOSCOPY (EGD) WITH PROPOFOL;  Surgeon: Rogene Houston, MD;  [facility]: [facility];  Service: Endoscopy;  Laterality: N/A;  . FLEXIBLE SIGMOIDOSCOPY  11/11/2011   Procedure: FLEXIBLE SIGMOIDOSCOPY;  Surgeon: Missy Sabins, MD;  Location: [facility] ENDOSCOPY;  Service: Endoscopy;  Laterality: N/A;  . goiter  1999  . INCISION AND DRAINAGE PERIRECTAL ABSCESS N/A 04/21/2017   Procedure: IRRIGATION AND DEBRIDEMENT PERIRECTAL ABSCESS;  Surgeon: Coralie Keens, MD;   Location: [place];  Service: General;  Laterality: N/A;  . LEFT HEART CATHETERIZATION WITH CORONARY ANGIOGRAM N/A 07/09/2014   Procedure: LEFT HEART CATHETERIZATION WITH CORONARY ANGIOGRAM;  Surgeon: Peter M Martinique, MD;  Location: [facility] CATH LAB;  Service: Cardiovascular;  Laterality: N/A;  . PROCTOSCOPY N/A 01/31/2015   Procedure: RIGID PROCTOSCOPY;  Surgeon: Leighton Ruff, MD;  Location: [place];  Service: General;  Laterality: N/A;  . RECTAL EXAM UNDER ANESTHESIA N/A 01/31/2015   Procedure: RECTAL EXAM UNDER ANESTHESIA WITH PLACEMENT OF A SETON;  Surgeon: Leighton Ruff, MD;  Location: [place];  Service: General;  Laterality: N/A;    Prior to Admission medications   Medication Sig Start Date End Date Taking? Authorizing Provider  acetaminophen (TYLENOL) 500 MG tablet Take 1,000 mg by mouth every 6 (six) hours as needed for mild pain, moderate pain, fever or headache.    Yes [provider]  albuterol (PROVENTIL HFA;VENTOLIN HFA) 108 (90 Base) MCG/ACT inhaler Inhale 2 puffs into the lungs every 6 (six) hours as needed for wheezing. 12/09/15  Yes Rama, Venetia Maxon, MD  Calcium Carbonate-Vitamin D (CALCIUM 600+D) 600-400 MG-UNIT tablet Take 1 tablet by mouth daily.   Yes [provider]  cyanocobalamin (,VITAMIN B-12,) 1000 MCG/ML injection Inject 1,000 mcg into the muscle every 30 (thirty) days.   Yes [provider]  dicyclomine (BENTYL) 10 MG/5ML syrup Take 20 mg by mouth 3 (three) times daily as needed (for GI upset).   Yes [provider]  diphenoxylate-atropine (LOMOTIL) 2.5-0.025 MG tablet TAKE 1 TABLET BY MOUTH 4 TIMES A DAY AS NEEDED FOR DIARRHEA/LOOSE STOOL 03/31/17  Yes Rehman, Mechele Dawley, MD  furosemide (LASIX) 40 MG tablet Take 1 tablet (40 mg total) by mouth daily as needed for edema. 06/30/16  Yes Rehman, Mechele Dawley, MD  HYDROcodone-acetaminophen (NORCO) 7.5-325 MG tablet Take 1 tablet by mouth every 12 (twelve) hours as needed for moderate pain.   Yes [provider]  Hydrocortisone (GERHARDT'S BUTT CREAM) CREA Apply 1 application topically 4 (four) times daily as needed for irritation.   Yes [provider]  inFLIXimab (REMICADE) 100 MG injection Inject 100 mg into the vein every 8 (eight) weeks.   Yes [provider]  Multiple Vitamin (MULTIVITAMIN WITH MINERALS) TABS Take 1 tablet by mouth daily.   Yes [provider]  nystatin (NYSTATIN) powder Apply 1 g topically 2 (two) times daily as needed (for itching/irritation).   Yes [provider]  nystatin cream (MYCOSTATIN) Apply 1 application topically 2 (two) times daily as needed for dry skin.   Yes [provider]  pantoprazole (PROTONIX) 40 MG tablet TAKE 1 TABLET (40 MG TOTAL) BY MOUTH 2 (TWO) TIMES DAILY BEFORE A MEAL. 05/25/16  Yes Setzer, Terri L, NP  potassium chloride SA (K-DUR,KLOR-CON) 20 MEQ tablet Take 20 mEq by mouth 3 (three) times daily.   Yes [provider]  promethazine (PHENERGAN) 25 MG tablet Take 25 mg by mouth 2 (two) times daily as needed for nausea or vomiting.   Yes [provider]  saccharomyces boulardii (FLORASTOR) 250 MG capsule Take 250 mg by mouth daily.   Yes [provider]  traMADol (ULTRAM) 50 MG tablet Take 1 tablet (50 mg total) by mouth every 6 (six) hours as  needed for moderate pain. 04/29/17  Yes Earnstine Regal, PA-C  Vitamin D, Ergocalciferol, (DRISDOL) 50000 units CAPS capsule Take 1 capsule (50,000 Units total) by mouth every 7 (seven) days. Patient taking differently: Take 50,000 Units by mouth every Monday.  07/19/16  Yes Rehman, Mechele Dawley, MD    Current Facility-Administered Medications  Medication Dose Route Frequency Provider Last Rate Last Dose  . acetaminophen (TYLENOL) tablet 650 mg  650 mg Oral Q6H PRN Ivor Costa, MD      . albuterol (PROVENTIL) (2.5 MG/3ML) 0.083% nebulizer solution 2.5 mg  2.5 mg Nebulization Q4H PRN Ivor Costa, MD      . calcium-vitamin D (OSCAL WITH D)  500-200 MG-UNIT per tablet 1 tablet  1 tablet Oral Daily Ivor Costa, MD   1 tablet at 05/09/17 1137  . cefTRIAXone (ROCEPHIN) 1 g in dextrose 5 % 50 mL IVPB  1 g Intravenous Q24H Ivor Costa, MD   Stopped at 05/10/17 2130  . Chlorhexidine Gluconate Cloth 2 % PADS 6 each  6 each Topical Q0600 Patrecia Pour, Christean Grief, MD   6 each at 05/11/17 [phone]  . cyanocobalamin ((VITAMIN B-12)) injection 1,000 mcg  1,000 mcg Intramuscular Q30 days Ivor Costa, MD   1,000 mcg at 05/07/17 1332  . dicyclomine (BENTYL) 10 MG/5ML syrup 20 mg  20 mg Oral TID PRN Ivor Costa, MD      . diphenoxylate-atropine (LOMOTIL) 2.5-0.025 MG per tablet 1 tablet  1 tablet Oral QID PRN Michael Boston, MD      . diphenoxylate-atropine (LOMOTIL) 2.5-0.025 MG per tablet 2 tablet  2 tablet Oral BID Michael Boston, MD   2 tablet at 05/11/17 0909  . feeding supplement (ENSURE ENLIVE) (ENSURE ENLIVE) liquid 237 mL  237 mL Oral BID BM Patrecia Pour, Christean Grief, MD   237 mL at 05/09/17 1000  . ferrous sulfate tablet 325 mg  325 mg Oral TID WC Michael Boston, MD      . Gerhardt's butt cream 1 application  1 application Topical QID PRN Ivor Costa, MD      . HYDROcodone-acetaminophen (Bier) 7.5-325 MG per tablet 1 tablet  1 tablet Oral Q6H PRN Ivor Costa, MD      . lactated ringers 1,000 mL with potassium chloride 20 mEq infusion   Intravenous Continuous Patrecia Pour, Christean Grief, MD 50 mL/hr at 05/11/17 1409    . liver oil-zinc oxide (DESITIN) 40 % ointment 1 application  1 application Topical BID Michael Boston, MD   1 application at 63/78/58 1000  . loperamide (IMODIUM) capsule 4 mg  4 mg Oral PRN Patrecia Pour, Christean Grief, MD   4 mg at 05/10/17 2314  . magic mouthwash w/lidocaine  5 mL Oral QID Patrecia Pour, Christean Grief, MD      . methylPREDNISolone sodium succinate (SOLU-MEDROL) 125 mg/2 mL injection 60 mg  60 mg Intravenous Q24H Patrecia Pour, Christean Grief, MD   60 mg at 05/11/17 1210  . multivitamin with minerals tablet 1 tablet  1 tablet Oral Daily Ivor Costa, MD   1 tablet at  05/07/17 1308  . mupirocin ointment (BACTROBAN) 2 % 1 application  1 application Nasal BID Patrecia Pour, Christean Grief, MD   1 application at 85/02/77 [phone]  . nystatin (MYCOSTATIN/NYSTOP) topical powder 1 g  1 g Topical BID PRN Ivor Costa, MD      . ondansetron [facility]) tablet 4 mg  4 mg Oral Q6H PRN Ivor Costa, MD       Or  . ondansetron [facility]) injection 4 mg  4  mg Intravenous Q6H PRN Ivor Costa, MD      . potassium chloride 20 MEQ/15ML (10%) solution 40 mEq  40 mEq Oral BID Patrecia Pour, Christean Grief, MD   40 mEq at 05/11/17 0908  . promethazine (PHENERGAN) tablet 25 mg  25 mg Oral BID PRN Ivor Costa, MD      . psyllium (HYDROCIL/METAMUCIL) packet 1 packet  1 packet Oral Daily Michael Boston, MD   1 packet at 05/09/17 1551  . saccharomyces boulardii (FLORASTOR) capsule 250 mg  250 mg Oral Daily Ivor Costa, MD   250 mg at 05/11/17 0909  . sodium bicarbonate tablet 650 mg  650 mg Oral BID Patrecia Pour, Christean Grief, MD   650 mg at 05/11/17 7494  . sodium chloride flush (NS) 0.9 % injection 10-40 mL  10-40 mL Intracatheter PRN Patrecia Pour, Christean Grief, MD      . sodium chloride flush (NS) 0.9 % injection 3 mL  3 mL Intravenous Q12H Ivor Costa, MD   3 mL at 05/10/17 0830  . zolpidem (AMBIEN) tablet 5 mg  5 mg Oral QHS PRN Ivor Costa, MD        Allergies as of 05/06/2017 - Review Complete 05/06/2017  Allergen Reaction Noted  . Zithromax [azithromycin] Swelling and Other (See Comments) 02/26/2014  . Aspirin Other (See Comments) 07/22/2011  . Ibuprofen Other (See Comments) 08/19/2015  . Sulfa antibiotics Nausea And Vomiting 11/06/2011    Family History  Problem Relation Age of Onset  . Diabetes Mother   . Heart disease Father   . Healthy Sister   . Hypertension Brother   . Colon cancer Brother        died with colon cancer  . Crohn's disease Paternal Aunt     Social History   Social History  . Marital status: Single    Spouse name: N/A  . Number of children: N/A  . Years of education: N/A   Occupational  History  . Not on file.   Social History Main Topics  . Smoking status: Former Smoker    Packs/day: 0.12    Years: 4.00    Types: Cigarettes    Quit date: 10/05/1996  . Smokeless tobacco: Never Used     Comment: 08/22/2013 1 pack every two weeks when she did smoked  . Alcohol use No  . Drug use: No  . Sexual activity: Not Currently    Birth control/ protection: Post-menopausal, Implant   Other Topics Concern  . Not on file   Social History Narrative   Lives in [place].  Moved from [place] in 2008.  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  LIves with her sister.   No home health services.      Review of Systems: Positive [id number], recent rectal abscesses status post I&D GI: Described in detail in HPI.    Gen: Complains of fatigue and poor appetite Denies any fever, chills, rigors, night sweats, weakness, malaise, involuntary weight loss, and sleep disorder CV: Denies chest pain, angina, palpitations, syncope, orthopnea, PND, peripheral edema, and claudication. Resp: Complains of dyspnea and coughing Denies sputum, wheezing, coughing up blood. GU : Complains of burning micturition Denies blood in urine, nocturnal urination, and urinary incontinence. MS: Denies joint pain or swelling.  Denies muscle weakness, cramps, atrophy.  Derm: Denies rash, itching, oral ulcerations, hives, unhealing ulcers.  Psych: Denies depression, anxiety, memory loss, suicidal ideation, hallucinations,  and confusion. Heme: Denies bruising, bleeding, and enlarged lymph nodes. Neuro:  Denies any headaches, dizziness, paresthesias. Endo:  Denies any problems with DM, thyroid, adrenal function.  Physical Exam: Vital signs in last 24 hours: Temp:  [97.9 F (36.6 C)-98.3 F (36.8 C)] 98.1 F (36.7 C) (06/05 1331) Pulse Rate:  [91-98] 98 (06/05 1331) Resp:  [12-20] 16 (06/05 1331) BP: (99-103)/(55-59) 103/59 (06/05 1331) SpO2:  [99 %-100 %] 100 % (06/05 1331) Weight:  [57.9 kg (127 lb 10.3 oz)] 57.9 kg (127 lb  10.3 oz) (06/05 0500) Last BM Date: 05/10/17  General:   Alert,  Well-developed, thinly built, pleasant and cooperative in NAD Head:  Normocephalic and atraumatic. Eyes:  Sclera clear, no icterus.   Mild pallor. Ears:  Normal auditory acuity. Nose:  No deformity, discharge,  or lesions. Mouth:  No deformity or lesions.  Oropharynx pink & moist. Neck:  Supple; no masses or thyromegaly. Lungs:  Clear throughout to auscultation.   No wheezes, crackles, or rhonchi. No acute distress. Heart:  Regular rate and rhythm; no murmurs, clicks, rubs,  or gallops. Extremities:  Without clubbing or edema. Neurologic:  Alert and  oriented x4;  grossly normal neurologically. Skin:  Intact without significant lesions or rashes. Psych:  Alert and cooperative. Normal mood and affect. Abdomen:  Surgical scars ,Soft, nontender and nondistended. No masses, hepatosplenomegaly or hernias noted. Normal bowel sounds, without guarding, and without rebound.         Lab Results:   Recent Labs  05/09/17 0036 05/10/17 0351 05/11/17 0528 05/11/17 1031  WBC 15.6* 6.0 14.8*  --   HGB 9.5* 13.5 8.3* 8.7*  HCT 28.2* 39.1 24.6* 25.7*  PLT 296 142* 213  --    BMET  Recent Labs  05/09/17 0036 05/10/17 0351 05/11/17 0528  NA 140 135 135  K 2.4* 3.5 3.5  CL 121* 116* 113*  CO2 11* 12* 15*  GLUCOSE 125* 142* 113*  BUN 10 8 9   CREATININE 1.12* 0.95 0.98  CALCIUM 8.1* 7.5* 7.4*   LFT  Recent Labs  05/11/17 0528  PROT 5.7*  ALBUMIN 2.3*  AST 18  ALT 17  ALKPHOS 66  BILITOT 0.4  BILIDIR 0.1  IBILI 0.3   PT/INR No results for input(s): LABPROT, INR in the last 72 hours.  Studies/Results: No results found.  Impression: 1. Uncontrolled diarrhea, hypokalemia, assistant alkalosis 2. Long-standing Crohn's disease, recent perianal fistula with rectal abscesses status post I&D, has not received Remicade infusions (over due by 2 weeks) 3. Microcytic anemia, FOBT positive stool(hemoglobin 8.3, 8.7,  hemoglobin of 13.5 from 05/10/2017 likely false result, as baseline hemoglobin is around 9-10) 4.C. difficile negative  Plan: 1.Will start colestipol 2 gm BID. May increase the dosage and frequency if needed 2. Low fiber diet. 3. Unfortunately Remicade infusions cannot be given as an inpatient, but it needs to be resumed as soon as patient is discharged as an outpatient. 4. Minimize use of antibiotic unless strongly indicated, she is currently on IV ceftriaxone. 5. Okay to continue lomotil and loperamide as needed. 6. Agree with using Butt paste, we need to ensure that it contains zinc oxide(40%) for barrier protection and healing. 7. Agree with discontinuing PO magnesium as it can worsen diarrhea, if replacement needed should give it IV.     LOS: 4 days   Ronnette Juniper, MD  05/11/2017, 3:49 PM  Pager [phone] If no answer or after 5 PM call [phone]

## 2017-05-11 NOTE — Care Management Important Message (Signed)
Important Message  Patient Details IM Letter given to Cookie/Case Manager to present to Patient Name: Danielle [name] MRN: [id number] Date of Birth: [birth year]   Medicare Important Message Given:  Yes    Kerin Salen 05/11/2017, 12:18 [place] Message  Patient Details  Name: Danielle [name] MRN: [id number] Date of Birth: 10/26/1946   Medicare Important Message Given:  Yes    Kerin Salen 05/11/2017, 12:18 PM

## 2017-05-12 DIAGNOSIS — J9601 Acute respiratory failure with hypoxia: Secondary | ICD-10-CM

## 2017-05-12 DIAGNOSIS — R634 Abnormal weight loss: Secondary | ICD-10-CM

## 2017-05-12 DIAGNOSIS — R0602 Shortness of breath: Secondary | ICD-10-CM

## 2017-05-12 DIAGNOSIS — R197 Diarrhea, unspecified: Secondary | ICD-10-CM

## 2017-05-12 DIAGNOSIS — N183 Chronic kidney disease, stage 3 (moderate): Secondary | ICD-10-CM

## 2017-05-12 DIAGNOSIS — N390 Urinary tract infection, site not specified: Secondary | ICD-10-CM

## 2017-05-12 DIAGNOSIS — R0600 Dyspnea, unspecified: Secondary | ICD-10-CM

## 2017-05-12 DIAGNOSIS — D899 Disorder involving the immune mechanism, unspecified: Secondary | ICD-10-CM

## 2017-05-12 DIAGNOSIS — K50113 Crohn's disease of large intestine with fistula: Secondary | ICD-10-CM

## 2017-05-12 DIAGNOSIS — N179 Acute kidney failure, unspecified: Secondary | ICD-10-CM

## 2017-05-12 DIAGNOSIS — L989 Disorder of the skin and subcutaneous tissue, unspecified: Secondary | ICD-10-CM

## 2017-05-12 DIAGNOSIS — R3989 Other symptoms and signs involving the genitourinary system: Secondary | ICD-10-CM

## 2017-05-12 DIAGNOSIS — D72829 Elevated white blood cell count, unspecified: Secondary | ICD-10-CM

## 2017-05-12 DIAGNOSIS — K529 Noninfective gastroenteritis and colitis, unspecified: Secondary | ICD-10-CM

## 2017-05-12 DIAGNOSIS — K50919 Crohn's disease, unspecified, with unspecified complications: Secondary | ICD-10-CM

## 2017-05-12 LAB — BASIC METABOLIC PANEL
Anion gap: 6 (ref 5–15)
BUN: 10 mg/dL (ref 6–20)
CHLORIDE: 115 mmol/L — AB (ref 101–111)
CO2: 15 mmol/L — AB (ref 22–32)
CREATININE: 0.9 mg/dL (ref 0.44–1.00)
Calcium: 7.2 mg/dL — ABNORMAL LOW (ref 8.9–10.3)
GFR calc Af Amer: >60 mL/min (ref >60)
GFR calc non Af Amer: >60 mL/min (ref >60)
GLUCOSE: 127 mg/dL — AB (ref 65–99)
POTASSIUM: 5.3 mmol/L — AB (ref 3.5–5.1)
Sodium: 136 mmol/L (ref 135–145)

## 2017-05-12 LAB — CBC
HCT: 24.9 % — ABNORMAL LOW (ref 36.0–46.0)
Hemoglobin: 8.4 g/dL — ABNORMAL LOW (ref 12.0–15.0)
MCH: 26.3 pg (ref 26.0–34.0)
MCHC: 33.7 g/dL (ref 30.0–36.0)
MCV: 77.8 fL — ABNORMAL LOW (ref 78.0–100.0)
Platelets: 205 10*3/uL (ref 150–400)
RBC: 3.2 MIL/uL — ABNORMAL LOW (ref 3.87–5.11)
RDW: 18.8 % — ABNORMAL HIGH (ref 11.5–15.5)
WBC: 7.9 10*3/uL (ref 4.0–10.5)

## 2017-05-12 LAB — CULTURE, BLOOD (ROUTINE X 2)
CULTURE: NO GROWTH
Culture: NO GROWTH
Special Requests: ADEQUATE
Special Requests: ADEQUATE

## 2017-05-12 LAB — GLUCOSE, CAPILLARY: GLUCOSE-CAPILLARY: 114 mg/dL — AB (ref 65–99)

## 2017-05-12 MED ORDER — NYSTATIN 100000 UNIT/GM EX CREA
TOPICAL_CREAM | Freq: Two times a day (BID) | CUTANEOUS | Status: DC
  Administered 2017-05-12: 17:00:00 via TOPICAL
  Administered 2017-05-13: 1 via TOPICAL
  Administered 2017-05-13 – 2017-05-14 (×2): via TOPICAL
  Filled 2017-05-12: qty 15

## 2017-05-12 NOTE — Progress Notes (Addendum)
  Speech Language Pathology Treatment: Dysphagia  Patient Details Name: Danielle [name] MRN: [id number] DOB: 11-Jun-1946 Today's Date: 05/12/2017 Time: 7793-9688 SLP Time Calculation (min) (ACUTE ONLY): 20 min  Assessment / Plan / Recommendation Clinical Impression  Pt seen at bedside for assessment of readiness to advance diet. Per RN, pt tolerating current diet without overt difficulty. Pt also reports no difficulty with solids/liquids. SLP observed pt with peanut butter/graham crackers and water. No overt s/s aspiration observed. Pt reports awareness of hiatal hernia, and the need to eat slowly, taking small bites/sips. She indicates tolerance of regular solids at home, and usually does not wear lower dentures while eating. Will recommend advancing diet to regular and thin liquids. ST will continue to follow for diet tolerance assessment. RN informed.   HPI HPI: Patient is a 71 y.o. female with PMH: Crohn's disease, chronic kidney disease, rectal abcess recently drained, presented to ED with SOB, generalized weakness, dysuria and burning on urination. Patient was admitted for suspected sepsis with hypoxemia and acute renal failure.       SLP Plan  Continue with current plan of care       Recommendations  Diet recommendations: Regular;Thin liquid Liquids provided via: Cup;No straw Medication Administration: Crushed with puree Supervision: Patient able to self feed Compensations: Minimize environmental distractions;Slow rate;Small sips/bites (smaller more frequent meals) Postural Changes and/or Swallow Maneuvers: Seated upright 90 degrees;Upright 30-60 min after meal                Oral Care Recommendations: Oral care BID Follow up Recommendations: Skilled Nursing facility;24 hour supervision/assistance SLP Visit Diagnosis: Dysphagia, oropharyngeal phase (R13.12) Plan: Continue with current plan of care       Celia B. [place], [facility], [place]    Shonna Chock 05/12/2017, 10:48 AM

## 2017-05-12 NOTE — Progress Notes (Signed)
PROGRESS NOTE    Danielle [name]  VQQ:[id number] DOB: 1946-07-01 DOA: 05/06/2017 PCP: Iona Beard, MD   Brief Narrative:  Patient is a 71 year old female with medical history significant for Crohn's disease, Chronic Kidney Disease stage III, history of rectovaginal fistula and rectal abscess that was recently drained  presented to the emergency department with shortness of breath, generalized weakness, dysuria and burning on urination. Patient was admitted for suspected sepsis with hypoxemia and acute renal failure. She had a positive d-dimer and VQ scan was done which showed low probability for pulmonary embolism. Doppler of the lower extremity was negative for DVT. Heparin drip was discontinued, she was found to have metabolic acidosis, which was leading to tachypnea. Bicarb ggt was started with improvement of her respiratory status, now trying to control her diarrhea from Crohn's she has been started on steroids. GI has been consulted and are recommending continuing IV Steroids today and possibly transitioning to po Prednisone in AM. Patient feels as if Diarrhea is slowly improving.   Assessment & Plan:   Principal Problem:   Acute respiratory failure with hypoxia (HCC) Active Problems:   Leukocytosis   Perianal Crohn's disease, with fistula (HCC)   Acute renal failure superimposed on stage 3 chronic kidney disease (HCC)   Immunosuppressed status (HCC)   Crohn disease (HCC)   Perineal irritation in female   Hypokalemia   Hypotension   Hiatal hernia with GERD   UTI (urinary tract infection)   Rectovaginal fistula   Chronic diarrhea   Infliximab (Remicade) long-term use   Anorexia   Weight loss, unintentional  Acute respiratory with Hypoxia, Resolved - Multifactorial from sepsis 2/2 UTI and metabolic acidosis from severe diarrhea, inducing hyperventilation. - Resolved  -PE Unlikely - initially treated with heparin ggt which was Discontinued on 05/07/17 -Was Treating acidosis with  bicarbonate drip, added Bicarb tablets as Bicarbonate gtt is now Discontinued -Chest CT with no acute abnormalities -Off oxygen now -C/w Albuterol 2.5 mg Neb q4hprn for Wheezing and SOB -Continue to monitor Respiratory Status and Maintain O2 Saturations >92%  Sepsis 2/2 UTI  -Sepsis physiology has resolved  -On IV Rocephin 1 gram q24h; Will complete 7 day of abx - on day 7 tonight -Urine culture multiple species, will not repeat given patient on antibiotics -Blood Cultures x2 showed NGTD at 5 days  Metabolic acidosis secondary to severe diarrhea from Crohn's disease -HCO3 slowly is improving and currently 15 -Initially treated with Bicarb ggt - transitioned to Bicarb tablets -C/w Sodium Bicarbonate 650 mg po BID -D/C'd Mag as probably contributing to diarrhea.   Hypokalemia  -secondary to diarrhea - Improved and now Elevated at 5.3 -Discontinued Oral K -C/w  IVF with LR + 20 mEQ of KCl given diarrhea and acidosis at 50 mL/hr Adding KCL to IVF  -Check CMP in AM  Acute kidney failure on chronic kidney disease stage III, improved  -Improved and stable  -Creatinine on Admission 1.70. Current BUN/Cr is 10/0.90 -Patient on IVF with LR + 20 mEQ of KCl given diarrhea and acidosis at 50 mL/hr Check BMP in AM   Acute Crohn's Disease Flare with recent perirectal abscess requiring I&D by Dr. Ninfa Linden on 04/21/17 -Patient started prednisone 40 mg, no significant improvement so was started on IV Solumedrol 60 mg daily -C/w IV Solumedrol 60 mg q24h for today and possilby transition to po Prednisone in AM -C/w Lomotiol 2 tabl po 4 times daily, Loperamide 4 mg po as needed for Diarrhea and Loose Stools, Bentyl 20 mg  po TIDprn and with Sacchromyces Boulardii 250 mg po Daily -Gastroenterology was consulted and appreciated Recc's -Recommended Adding Colestipol 2 mg po BID -Continue Low Fiber Diet  -C Difficile was Negative -Will need Remicade Infusion soon  -Appreciate Gastroenterology following   -Pain control with Norco 7.5-325 mg po 1 tab q6hprn  Diarrhea From Crohn's Disease, slowly improving  -As Above C/w Lomotiol 2 tabl po 4 times daily, Loperamide 4 mg po as needed for Diarrhea and Loose Stools, Bentyl 20 mg po TIDprn and with Sacchromyces Boulardii 250 mg po Daily -Gastroenterology was consulted and appreciated Recc's  Leukocytosis  -Likely from Crohn's Flare -Improved as WBC went from 14.8 -> 7.9 -Repeat CBC in AM   Perirectal Abscess, recently the drain x 2  -Surgery consulted - Appreciated recommendations, -Recommending to control diarrhea and follow up as outpatient.  -C/w Gerhardt's Butt Cream 1 application Topically 4 times daily prn and Destitin 1 application Topically BID daily  -Per Surgery may ultimately need a diverting colostomy but patient has refused -Pain control with Norco 7.5-325 mg po 1 tab q6hprn -Follow up with Dr. Leighton Ruff in 1-2 weeks after Discharge  Anemia of chronic disease  -Hgb has been trending down, likely from crohn's flare  -Hb/Hct went from 8.7/25.7 -> 8.4/24.9 -FOBT positive  0-C.w Ferrous Sulfate 325 mg po TIDwm and with Cyanocobalamin 1,000 mcg IM q30 Days  Transfuse if Hgb < 7 or symptomatic  -Continue to Monitor and Repeat CBC in AM  ? Psoriasis -C/w Nystatin Topical Powder and Cream for Itching and Irritation   DVT prophylaxis: SCDs Code Status: FULL CODE Family Communication: No family present at bedside Disposition Plan: Remain Inpatient and Home in 1-2 days  Consultants:   General Surgery  Gastroenetrology   Procedures: Recent I&D Perirectal Abscess on 04/21/17 done by Dr. Ninfa Linden   Antimicrobials:  Anti-infectives    Start     Dose/Rate Route Frequency Ordered Stop   05/07/17 2359  vancomycin (VANCOCIN) 500 mg in sodium chloride 0.9 % 100 mL IVPB  Status:  Discontinued     500 mg 100 mL/hr over 60 Minutes Intravenous Every 24 hours 05/07/17 0040 05/07/17 0051   05/07/17 2200  ceFEPIme (MAXIPIME) 2 g  in dextrose 5 % 50 mL IVPB  Status:  Discontinued     2 g 100 mL/hr over 30 Minutes Intravenous Every 24 hours 05/06/17 2254 05/07/17 0028   05/07/17 2200  ceFEPIme (MAXIPIME) 1 g in dextrose 5 % 50 mL IVPB  Status:  Discontinued     1 g 100 mL/hr over 30 Minutes Intravenous Every 24 hours 05/07/17 0028 05/07/17 0040   05/07/17 2000  cefTRIAXone (ROCEPHIN) 1 g in dextrose 5 % 50 mL IVPB     1 g 100 mL/hr over 30 Minutes Intravenous Every 24 hours 05/07/17 0040     05/07/17 0030  metroNIDAZOLE (FLAGYL) tablet 500 mg  Status:  Discontinued     500 mg Oral Once 05/07/17 0028 05/07/17 0039   05/07/17 0000  vancomycin (VANCOCIN) IVPB 1000 mg/200 mL premix     1,000 mg 200 mL/hr over 60 Minutes Intravenous  Once 05/06/17 2348 05/07/17 0100   05/06/17 2230  ceFEPIme (MAXIPIME) 2 g in dextrose 5 % 50 mL IVPB     2 g 100 mL/hr over 30 Minutes Intravenous STAT 05/06/17 2224 05/06/17 2310     Subjective: Seen and examined at beside and stated she didn't sleep well because she was up using the bathroom. Thinks diarrhea is  slowing down. No nausea or vomiting. Complaining of some pain where her abscess was in the rectum area. No other concerns or complaints at this time.    Objective: Vitals:   05/11/17 1331 05/11/17 2203 05/12/17 0431 05/12/17 0432  BP: (!) 103/59 107/64 (!) 100/57   Pulse: 98 74 77   Resp: 16 17 20    Temp: 98.1 F (36.7 C) 98 F (36.7 C) 97.8 F (36.6 C)   TempSrc: Oral Oral Oral   SpO2: 100% 100% 100%   Weight:    58.5 kg (128 lb 15.5 oz)  Height:        Intake/Output Summary (Last 24 hours) at 05/12/17 0751 Last data filed at 05/12/17 0600  Gross per 24 hour  Intake           1932.5 ml  Output                0 ml  Net           1932.5 ml   Filed Weights   05/10/17 0527 05/11/17 0500 05/12/17 0432  Weight: 58.6 kg (129 lb 3 oz) 57.9 kg (127 lb 10.3 oz) 58.5 kg (128 lb 15.5 oz)   Examination: Physical Exam:  Constitutional: NAD and appears calm and  comfortable Eyes: Lids and conjunctivae normal, sclerae anicteric  ENMT: External Ears, Nose appear normal. Grossly normal hearing.  Neck: Appears normal, supple, no cervical masses, normal ROM, no appreciable thyromegaly, no JVD Respiratory: Clear to auscultation bilaterally, no wheezing, rales, rhonchi or crackles. Normal respiratory effort and patient is not tachypenic. No accessory muscle use.  Cardiovascular: RRR, no murmurs / rubs / gallops. S1 and S2 auscultated.  Abdomen: Soft, non-tender, non-distended. No masses palpated. No appreciable hepatosplenomegaly. Bowel sounds positive.  GU: Deferred. Musculoskeletal: No clubbing / cyanosis of digits/nails. No joint deformity upper and lower extremities.  Skin: Unable to turn to view perirectal abscess Neurologic: CN 2-12 grossly intact with no focal deficits. Sensation intact in all 4 Extremities. Romberg sign cerebellar reflexes not assessed.  Psychiatric: Normal judgment and insight. Alert and oriented x 3. Normal mood and appropriate affect.   Data Reviewed: I have personally reviewed following labs and imaging studies  CBC:  Recent Labs Lab 05/06/17 2122  05/08/17 0508 05/09/17 0036 05/10/17 0351 05/11/17 0528 05/11/17 1031 05/12/17 0503  WBC 18.3*  < > 14.7* 15.6* 6.0 14.8*  --  7.9  NEUTROABS 10.9*  --  9.8* 8.8*  --   --   --   --   HGB 11.8*  < > 10.0* 9.5* 13.5 8.3* 8.7* 8.4*  HCT 35.1*  < > 30.1* 28.2* 39.1 24.6* 25.7* 24.9*  MCV 77.0*  < > 77.0* 76.0* 75.8* 76.4*  --  77.8*  PLT 451*  < > 338 296 142* 213  --  205  < > = values in this interval not displayed. Basic Metabolic Panel:  Recent Labs Lab 05/08/17 0508 05/09/17 0036 05/10/17 0351 05/11/17 0528 05/12/17 0503  NA 141 140 135 135 136  K 2.7* 2.4* 3.5 3.5 5.3*  CL 123* 121* 116* 113* 115*  CO2 8* 11* 12* 15* 15*  GLUCOSE 75 125* 142* 113* 127*  BUN 14 10 8 9 10   CREATININE 1.29* 1.12* 0.95 0.98 0.90  CALCIUM 7.9* 8.1* 7.5* 7.4* 7.2*  MG 1.4* 1.9   --   --   --    GFR: Estimated Creatinine Clearance: 52.1 mL/min (by C-G formula based on SCr of 0.9 mg/dL). Liver  Function Tests:  Recent Labs Lab 05/06/17 2122 05/08/17 0508 05/11/17 0528  AST 47* 28 18  ALT 33 22 17  ALKPHOS 92 79 66  BILITOT 1.0 0.3 0.4  PROT 8.2* 6.8 5.7*  ALBUMIN 3.3* 2.7* 2.3*   No results for input(s): LIPASE, AMYLASE in the last 168 hours. No results for input(s): AMMONIA in the last 168 hours. Coagulation Profile:  Recent Labs Lab 05/06/17 2133  INR 1.18   Cardiac Enzymes:  Recent Labs Lab 05/07/17 0245 05/07/17 1151  TROPONINI <0.03 <0.03   BNP (last 3 results) No results for input(s): PROBNP in the last 8760 hours. HbA1C: No results for input(s): HGBA1C in the last 72 hours. CBG:  Recent Labs Lab 05/08/17 0732 05/08/17 0813 05/09/17 0756 05/10/17 0749 05/11/17 0802  GLUCAP 62* 135* 109* 135* 101*   Lipid Profile: No results for input(s): CHOL, HDL, LDLCALC, TRIG, CHOLHDL, LDLDIRECT in the last 72 hours. Thyroid Function Tests: No results for input(s): TSH, T4TOTAL, FREET4, T3FREE, THYROIDAB in the last 72 hours. Anemia Panel: No results for input(s): VITAMINB12, FOLATE, FERRITIN, TIBC, IRON, RETICCTPCT in the last 72 hours. Sepsis Labs:  Recent Labs Lab 05/06/17 2255 05/07/17 0029 05/07/17 0245 05/07/17 1351 05/09/17 0036 05/09/17 2109 05/11/17 0528  PROCALCITON  --   --   --  0.73 0.41  --  0.19  LATICACIDVEN 1.78 1.63 2.0*  --   --  1.2  --     Recent Results (from the past 240 hour(s))  Blood Culture (routine x 2)     Status: None (Preliminary result)   Collection Time: 05/06/17  9:22 PM  Result Value Ref Range Status   Specimen Description BLOOD LEFT ANTECUBITAL  Final   Special Requests   Final    BOTTLES DRAWN AEROBIC AND ANAEROBIC Blood Culture adequate volume   Culture   Final    NO GROWTH 4 DAYS Performed at [facility], [place] [address] [place], [place] [address]    Report Status PENDING   Incomplete  Blood Culture (routine x 2)     Status: None (Preliminary result)   Collection Time: 05/06/17 10:50 PM  Result Value Ref Range Status   Specimen Description BLOOD R UPPER ARM  Final   Special Requests IN PEDIATRIC BOTTLE Blood Culture adequate volume  Final   Culture   Final    NO GROWTH 4 DAYS Performed at [facility], [place] [address] [place], [place] [address]    Report Status PENDING  Incomplete  Urine culture     Status: Abnormal   Collection Time: 05/06/17 11:42 PM  Result Value Ref Range Status   Specimen Description URINE, RANDOM  Final   Special Requests NONE  Final   Culture MULTIPLE SPECIES PRESENT, SUGGEST RECOLLECTION (A)  Final   Report Status 05/08/2017 FINAL  Final  MRSA PCR Screening     Status: Abnormal   Collection Time: 05/07/17  4:41 AM  Result Value Ref Range Status   MRSA by PCR POSITIVE (A) NEGATIVE Final    Comment:        The GeneXpert MRSA Assay (FDA approved for NASAL specimens only), is one component of a comprehensive MRSA colonization surveillance program. It is not intended to diagnose MRSA infection nor to guide or monitor treatment for MRSA infections. RESULT CALLED TO, READ BACK BY AND VERIFIED WITH: DARK, D. RN @0900  ON 6.1.18 BY NMCCOY   C difficile quick scan w PCR reflex     Status: None  Collection Time: 05/07/17  9:03 AM  Result Value Ref Range Status   C Diff antigen NEGATIVE NEGATIVE Final   C Diff toxin NEGATIVE NEGATIVE Final   C Diff interpretation No C. difficile detected.  Final   Radiology Studies: No results found.   Scheduled Meds: . calcium-vitamin D  1 tablet Oral Daily  . colestipol  2 g Oral BID  . cyanocobalamin  1,000 mcg Intramuscular Q30 days  . diphenoxylate-atropine  2 tablet Oral QID  . feeding supplement (ENSURE ENLIVE)  237 mL Oral BID BM  . ferrous sulfate  325 mg Oral TID WC  . liver oil-zinc oxide  1 application Topical BID  . magic mouthwash w/lidocaine  5 mL Oral QID  .  methylPREDNISolone (SOLU-MEDROL) injection  60 mg Intravenous Q24H  . multivitamin with minerals  1 tablet Oral Daily  . saccharomyces boulardii  250 mg Oral Daily  . sodium bicarbonate  650 mg Oral BID  . sodium chloride flush  3 mL Intravenous Q12H   Continuous Infusions: . cefTRIAXone (ROCEPHIN)  IV Stopped (05/11/17 2030)  . lactated ringers with kcl 50 mL/hr at 05/12/17 0300     LOS: 5 days    Kerney Elbe, DO [facility] Pager [phone]  If 7PM-7AM, please contact night-coverage [email or web address] Password [email or web address] 05/12/2017, 7:51 AM

## 2017-05-12 NOTE — Progress Notes (Signed)
[facility] Gastroenterology Progress Note  Danielle [name] 71 y.o. 1946-10-31  CC:  Diarrhea. History of Crohn's disease.   Subjective: Some improvement in diarrhea noted. She is having somewhat formed stool but still having 5-6 bowel movements per day. Complaining of some nausea. Denied vomiting. Denied blood in the stool  ROS : Negative for chest pain and shortness of breath.   Objective: Vital signs in last 24 hours: Vitals:   05/11/17 2203 05/12/17 0431  BP: 107/64 (!) 100/57  Pulse: 74 77  Resp: 17 20  Temp: 98 F (36.7 C) 97.8 F (36.6 C)    Physical Exam:  General:  Alert, cooperative, no distress, appears stated age  Head:  Normocephalic, without obvious abnormality, atraumatic  Eyes:  , EOM's intact,   Lungs:   Clear to auscultation bilaterally, respirations unlabored  Heart:  Regular rate and rhythm, S1, S2 normal  Abdomen:   Scar marks from previous surgery noted. Soft, non-tender, bowel sounds active all four quadrants,  no masses,   Extremities: Extremities normal, atraumatic, no  edema       Lab Results:  Recent Labs  05/11/17 0528 05/12/17 0503  NA 135 136  K 3.5 5.3*  CL 113* 115*  CO2 15* 15*  GLUCOSE 113* 127*  BUN 9 10  CREATININE 0.98 0.90  CALCIUM 7.4* 7.2*    Recent Labs  05/11/17 0528  AST 18  ALT 17  ALKPHOS 66  BILITOT 0.4  PROT 5.7*  ALBUMIN 2.3*    Recent Labs  05/11/17 0528 05/11/17 1031 05/12/17 0503  WBC 14.8*  --  7.9  HGB 8.3* 8.7* 8.4*  HCT 24.6* 25.7* 24.9*  MCV 76.4*  --  77.8*  PLT 213  --  205   No results for input(s): LABPROT, INR in the last 72 hours.    Assessment/Plan: - Diarrhea. Improving. C. difficile negative. - History of Crohn's disease with recent perirectal abscess requiring I and D on 04/21/2017. - Anemia. Probably from underlying Crohn's disease. Had EGD and colonoscopy recently - Leukocytosis. Resolved  Recommendations ---------------------- - Continue colestipol. Continue as needed  Bentyl and loperamide. - Consider switching to oral prednisone if continues to have improvement in diarrhea. - Patient is due for her  Remicade infusion. - Surgery team is  recommending diverting  colostomy but patient is declining  - GI will follow.  Otis Brace MD, [place] 05/12/2017, 8:33 AM  Pager [phone]  If no answer or after 5 PM call [phone]

## 2017-05-13 DIAGNOSIS — Z79899 Other long term (current) drug therapy: Secondary | ICD-10-CM

## 2017-05-13 DIAGNOSIS — K449 Diaphragmatic hernia without obstruction or gangrene: Secondary | ICD-10-CM

## 2017-05-13 DIAGNOSIS — N823 Fistula of vagina to large intestine: Secondary | ICD-10-CM

## 2017-05-13 DIAGNOSIS — K219 Gastro-esophageal reflux disease without esophagitis: Secondary | ICD-10-CM

## 2017-05-13 DIAGNOSIS — E876 Hypokalemia: Secondary | ICD-10-CM

## 2017-05-13 LAB — COMPREHENSIVE METABOLIC PANEL
ALT: 18 U/L (ref 14–54)
ANION GAP: 5 (ref 5–15)
AST: 19 U/L (ref 15–41)
Albumin: 2.5 g/dL — ABNORMAL LOW (ref 3.5–5.0)
Alkaline Phosphatase: 67 U/L (ref 38–126)
BILIRUBIN TOTAL: 0.2 mg/dL — AB (ref 0.3–1.2)
BUN: 13 mg/dL (ref 6–20)
CHLORIDE: 113 mmol/L — AB (ref 101–111)
CO2: 19 mmol/L — ABNORMAL LOW (ref 22–32)
Calcium: 7.5 mg/dL — ABNORMAL LOW (ref 8.9–10.3)
Creatinine, Ser: 0.97 mg/dL (ref 0.44–1.00)
GFR calc Af Amer: >60 mL/min (ref >60)
GFR calc non Af Amer: 58 mL/min — ABNORMAL LOW (ref >60)
GLUCOSE: 93 mg/dL (ref 65–99)
POTASSIUM: 4.5 mmol/L (ref 3.5–5.1)
Sodium: 137 mmol/L (ref 135–145)
TOTAL PROTEIN: 6.1 g/dL — AB (ref 6.5–8.1)

## 2017-05-13 LAB — PHOSPHORUS: Phosphorus: 2.1 mg/dL — ABNORMAL LOW (ref 2.5–4.6)

## 2017-05-13 LAB — CBC WITH DIFFERENTIAL/PLATELET
BASOS ABS: 0 10*3/uL (ref 0.0–0.1)
BASOS PCT: 0 %
EOS ABS: 0 10*3/uL (ref 0.0–0.7)
EOS PCT: 0 %
HEMATOCRIT: 26.7 % — AB (ref 36.0–46.0)
Hemoglobin: 8.7 g/dL — ABNORMAL LOW (ref 12.0–15.0)
Lymphocytes Relative: 26 %
Lymphs Abs: 4 10*3/uL (ref 0.7–4.0)
MCH: 25.4 pg — ABNORMAL LOW (ref 26.0–34.0)
MCHC: 32.6 g/dL (ref 30.0–36.0)
MCV: 78.1 fL (ref 78.0–100.0)
MONO ABS: 1.2 10*3/uL — AB (ref 0.1–1.0)
MONOS PCT: 8 %
Neutro Abs: 10.4 10*3/uL — ABNORMAL HIGH (ref 1.7–7.7)
Neutrophils Relative %: 66 %
PLATELETS: 203 10*3/uL (ref 150–400)
RBC: 3.42 MIL/uL — ABNORMAL LOW (ref 3.87–5.11)
RDW: 18.8 % — AB (ref 11.5–15.5)
WBC: 15.5 10*3/uL — ABNORMAL HIGH (ref 4.0–10.5)

## 2017-05-13 LAB — IRON AND TIBC
Iron: 28 ug/dL (ref 28–170)
Saturation Ratios: 14 % (ref 10.4–31.8)
TIBC: 202 ug/dL — ABNORMAL LOW (ref 250–450)
UIBC: 174 ug/dL

## 2017-05-13 LAB — RETICULOCYTES
RBC.: 3.36 MIL/uL — AB (ref 3.87–5.11)
RETIC CT PCT: 1.8 % (ref 0.4–3.1)
Retic Count, Absolute: 60.5 10*3/uL (ref 19.0–186.0)

## 2017-05-13 LAB — MAGNESIUM: MAGNESIUM: 1.2 mg/dL — AB (ref 1.7–2.4)

## 2017-05-13 LAB — GLUCOSE, CAPILLARY: GLUCOSE-CAPILLARY: 81 mg/dL (ref 65–99)

## 2017-05-13 LAB — FOLATE: FOLATE: 5.6 ng/mL — AB (ref >5.9)

## 2017-05-13 LAB — FERRITIN: Ferritin: 158 ng/mL (ref 11–307)

## 2017-05-13 MED ORDER — LOPERAMIDE HCL 2 MG PO CAPS
4.0000 mg | ORAL_CAPSULE | Freq: Four times a day (QID) | ORAL | Status: DC
  Administered 2017-05-13 – 2017-05-14 (×5): 4 mg via ORAL
  Filled 2017-05-13 (×5): qty 2

## 2017-05-13 MED ORDER — PREDNISONE 20 MG PO TABS
40.0000 mg | ORAL_TABLET | Freq: Every day | ORAL | Status: DC
  Administered 2017-05-14: 40 mg via ORAL
  Filled 2017-05-13: qty 2

## 2017-05-13 MED ORDER — MAGNESIUM SULFATE 4 GM/100ML IV SOLN
4.0000 g | Freq: Once | INTRAVENOUS | Status: AC
  Administered 2017-05-13: 4 g via INTRAVENOUS
  Filled 2017-05-13: qty 100

## 2017-05-13 MED ORDER — POTASSIUM PHOSPHATE MONOBASIC 500 MG PO TABS
500.0000 mg | ORAL_TABLET | Freq: Three times a day (TID) | ORAL | Status: DC
  Administered 2017-05-13 – 2017-05-14 (×2): 500 mg via ORAL
  Filled 2017-05-13 (×2): qty 1

## 2017-05-13 MED ORDER — ZINC OXIDE 20 % EX OINT
1.0000 "application " | TOPICAL_OINTMENT | Freq: Two times a day (BID) | CUTANEOUS | Status: DC
  Administered 2017-05-13 – 2017-05-14 (×2): 1 via TOPICAL
  Filled 2017-05-13 (×2): qty 28.35

## 2017-05-13 NOTE — Progress Notes (Signed)
Patient states that her diarrhea continues to improve, but is not back to her normal baseline of 4 soft bowel movements per day (rather, she thinks she has had 7 or 8 loose bowel movements, including nocturnal bowel movements and incontinence, over the past 24 hours). This correlates with cessation of ceftriaxone and oral magnesium.  Exam: There is no overt perianal residual abscess, drainage, or tenderness that I can observe.  Impression: Gradually improving acute on chronic diarrhea, most likely multifactorial but exacerbated by recent antibiotic therapy.  Recommendation:  1. I do think discharge from the GI tract standpoint is reasonable. The patient thinks she could get by at home the way things are.  2. I would definitely continue probiotics in this patient.  3. Prior to discharge, it would be ideal to have a general surgeon examine the patient to verify resolution of her abscess, and thereby clear her for Remicade infusion, which could then be arranged by her primary gastroenterologist in [place], Dr. Laural Golden.  4. I am not a big fan of Lomotil because of its CNS effects. I would recommend transitioning the patient to a standing dose of loperamide, 2 tablets every 4 hours while awake. She can hold the dose if there has been no bowel movement in the intervening 4 hours.  5. For now, I think continued use of colestipol is appropriate. She could stop it at home whenever her bowel movements return to baseline.  6. I think it is reasonable to transition the patient to oral steroids (for example, prednisone 40 mg each morning) and she could remain on that  while awaiting Remicade infusion which could hopefully be accomplished in the very near future.  7. I will sign off. Please call us if we can be of further assistance in this patient's care.  Cleotis Nipper, M.D. Pager [phone] If no answer or after 5 PM call [phone]

## 2017-05-13 NOTE — Progress Notes (Signed)
Nutrition Follow-up  INTERVENTION:   -D/c Ensure supplements -pt not drinking them  RD to continue to monitor for needs  NUTRITION DIAGNOSIS:   Increased nutrient needs related to acute illness, chronic illness as evidenced by estimated needs. Ongoing.  GOAL:   Patient will meet greater than or equal to 90% of their needs  Progressing.  MONITOR:   PO intake, Supplement acceptance, Labs, Weight trends, I & O's, Skin  ASSESSMENT:   71 y.o. female with medical history significant of psoriasis, Crohn's disease (follows with GI, Dr. Bernadene Person, on remicaide infusion), hiatal hernia, GERD, chronic kidney disease-stage III, hx of rectovaginal fistula and recent rectal abscess that was drained recently, who presents with shortness breath, generalized weakness, dysuria and burning on urination.  Patient's diarrhea symptoms improving. Per SLP evaluation 6/6, diet advanced to regular diet with thin liquids. PO intake: 80%. Patient not drinking supplements.  Medications: Magic Mouthwash w/ lidocaine QID, Florastor capsule daily, Lactated Ringers with KCl at 50 ml/hr  Labs reviewed: Low Mg/Phos  Diet Order:  DIET SOFT Room service appropriate? Yes; Fluid consistency: Thin  Skin:  Reviewed, no issues  Last BM:  6/6  Height:   Ht Readings from Last 1 Encounters:  05/07/17 5' 4.5" (1.638 m)    Weight:   Wt Readings from Last 1 Encounters:  05/13/17 133 lb 2.5 oz (60.4 kg)    Ideal Body Weight:  54.5 kg  BMI:  Body mass index is 22.5 kg/m.  Estimated Nutritional Needs:   Kcal:  7615-1834  Protein:  85-95 gm  Fluid:  1.7-1.9 L  EDUCATION NEEDS:   No education needs identified at this time  Clayton Bibles, MS, RD, LDN Pager: [phone] After Hours Pager: [phone]

## 2017-05-13 NOTE — Progress Notes (Signed)
PROGRESS NOTE    Danielle [name]  ALP:[id number] DOB: 1946/07/30 DOA: 05/06/2017 PCP: Iona Beard, MD   Brief Narrative:  Patient is a 71 year old female with medical history significant for Crohn's disease, Chronic Kidney Disease stage III, history of rectovaginal fistula and rectal abscess that was recently drained  presented to the emergency department with shortness of breath, generalized weakness, dysuria and burning on urination. Patient was admitted for suspected sepsis with hypoxemia and acute renal failure. She had a positive d-dimer and VQ scan was done which showed low probability for pulmonary embolism. Doppler of the lower extremity was negative for DVT. Heparin drip was discontinued, she was found to have metabolic acidosis, which was leading to tachypnea. Bicarb ggt was started with improvement of her respiratory status, now trying to control her diarrhea from Crohn's she has been started on steroids. Discussed with GI and will transitioning to po Prednisone in AM. Patient feels as if Diarrhea is slowly improving however GI does not feel like her symptoms are from Acute Crohns flare but rather bacterial Intestinal overgrowth and have recommended stopping Lomotil at this point and scheduling Loperamide   Assessment & Plan:   Principal Problem:   Acute respiratory failure with hypoxia (HCC) Active Problems:   Leukocytosis   Perianal Crohn's disease, with fistula (HCC)   Acute renal failure superimposed on stage 3 chronic kidney disease (HCC)   Immunosuppressed status (HCC)   Crohn disease (HCC)   Perineal irritation in female   Hypokalemia   Hypotension   Hiatal hernia with GERD   UTI (urinary tract infection)   Rectovaginal fistula   Chronic diarrhea   Infliximab (Remicade) long-term use   Anorexia   Weight loss, unintentional  Acute respiratory with Hypoxia, Resolved - Multifactorial from sepsis 2/2 UTI and metabolic acidosis from severe diarrhea, inducing  hyperventilation. - Resolved  -PE Unlikely - initially treated with heparin gtt which was Discontinued on 05/07/17 -Was Treating acidosis with bicarbonate drip, added Bicarb tablets as Bicarbonate gtt is now Discontinued -Chest CT with no acute abnormalities -Off oxygen now -C/w Albuterol 2.5 mg Neb q4hprn for Wheezing and SOB -Continue to monitor Respiratory Status and Maintain O2 Saturations >92%  Sepsis 2/2 UTI  -Sepsis physiology has resolved  -Was On IV Rocephin 1 gram q24h; Completed 7 day Course and Abx Sgtopped -Urine culture multiple species, will not repeat given patient on antibiotics -Blood Cultures x2 showed NGTD at 5 days  Metabolic acidosis secondary to severe diarrhea from Crohn's disease vs Bacterial Overgrowth  -HCO3 slowly is improving and currently 19 -Initially treated with Bicarb ggt - transitioned to Bicarb tablets -C/w Sodium Bicarbonate 650 mg po BID -D/C'd Mag as probably contributing to diarrhea.   Hypokalemia  -secondary to diarrhea - Improved and now 4.5 -Discontinued Oral K yesterday -C/w  IVF with LR + 20 mEQ of KCl given diarrhea and acidosis at 50 mL/hr Adding KCL to IVF  -Check CMP in AM  Hypomagnesemia -Mag Level was 1.2 -Replete with IV Mag Sulfate 4 grams -Continue to Monitor and Replete as Necessary -Repeat Mag Level in AM  Hypophosphatemia -Phos Level this Am was 2.1 -Replete with po K Phos 500 mg TID x 3 doses -Continue to Monitor and Replete as Necessary -Repeat Mag Level in AM  Acute kidney failure on chronic kidney disease stage III, improved  -Improved and stable  -Creatinine on Admission 1.70. Current BUN/Cr is now 13/0.97 from 13/0.97 -Patient on IVF with LR + 20 mEQ of KCl given diarrhea and  acidosis at 50 mL/hr -Check CMP in AM   Acute Crohn's Disease Flare with recent perirectal abscess requiring I&D by Dr. Ninfa Linden on 04/21/17 -Patient started prednisone 40 mg, no significant improvement so was started on IV Solumedrol 60  mg daily -C/w IV Solumedrol 60 mg q24h for today and possilby transition to po Prednisone in AM -D/C'd Lomotiol 2 tabl po 4 times daily per GI Recc's; C/w  Loperamide 4 mg po q6h scheduled for Diarrhea and Loose Stools and hold for No diarrhea in the preceding 6 hours, Bentyl 20 mg po TIDprn and with Sacchromyces Boulardii 250 mg po Daily -Gastroenterology was consulted and appreciated Recc's -Recommended C/w Colestipol 2 mg po BID -Continue Low Fiber Diet  -C Difficile was Negative -Will need Remicade Infusion soon  -Appreciate Gastroenterology following  -Pain control with Norco 7.5-325 mg po 1 tab q6hprn  Diarrhea From Crohn's Disease vs Bacterial Overgrowth, slowly improving  -As Above -D/C'd Lomotiol 2 tabl po 4 times daily per GI Recc's; C/w  Loperamide 4 mg po q6h scheduled for Diarrhea and Loose Stools and hold for No diarrhea in the preceding 6 hours, Bentyl 20 mg po TIDprn and with Sacchromyces Boulardii 250 mg po Daily-Gastroenterology was consulted and appreciated Recc's  Leukocytosis  -Likely from Crohn's Flare and IV Steroid Use -Improved as WBC went from 14.8 -> 7.9 -> 15.5 -Continue to Monitor for S/Sx of Infection -Repeat CBC in AM   Perirectal Abscess, recently the drain x 2  -Surgery consulted - Appreciated recommendations, -Recommending to control diarrhea and follow up as outpatient.  -C/w Gerhardt's Butt Cream 1 application Topically 4 times daily prn and Destitin 1 application Topically BID daily  -Per Surgery may ultimately need a diverting colostomy but patient has refused -Pain control with Norco 7.5-325 mg po 1 tab q6hprn -Follow up with Dr. Leighton Ruff in 1-2 weeks after Discharge -*Will need to Discuss with Surgery if patient is ok to restart on Remicade Infusions  Anemia of chronic disease/Iron Deficiency Anemia  -Iron Panel Done and showed Iron level of 28, UIBC of 174, TIBC of 202, Saturation Ratios of 14, Ferritn of 158, Folate of 5.6  -Hgb has been  trending down, likely from crohn's flare  -Hb/Hct went from 8.7/25.7 -> 8.4/24.9 -> 8.7/26.7 -FOBT positive  -C.w Ferrous Sulfate 325 mg po TIDwm and with Cyanocobalamin 1,000 mcg IM q30 Days  Transfuse if Hgb < 7 or symptomatic  -Continue to Monitor and Repeat CBC in AM  ? Psoriasis -C/w Nystatin Topical Powder and Cream for Itching and Irritation   DVT prophylaxis: SCDs Code Status: FULL CODE Family Communication: No family present at bedside Disposition Plan: D/C Home in AM with Home Health PT  Consultants:   General Surgery  Gastroenetrology Dr. Tana Felts. [place]/ Dr. Cristina Gong    Procedures: Recent I&D Perirectal Abscess on 04/21/17 done by Dr. Ninfa Linden   Antimicrobials:  Anti-infectives    Start     Dose/Rate Route Frequency Ordered Stop   05/07/17 2359  vancomycin (VANCOCIN) 500 mg in sodium chloride 0.9 % 100 mL IVPB  Status:  Discontinued     500 mg 100 mL/hr over 60 Minutes Intravenous Every 24 hours 05/07/17 0040 05/07/17 0051   05/07/17 2200  ceFEPIme (MAXIPIME) 2 g in dextrose 5 % 50 mL IVPB  Status:  Discontinued     2 g 100 mL/hr over 30 Minutes Intravenous Every 24 hours 05/06/17 2254 05/07/17 0028   05/07/17 2200  ceFEPIme (MAXIPIME) 1 g in dextrose 5 %  50 mL IVPB  Status:  Discontinued     1 g 100 mL/hr over 30 Minutes Intravenous Every 24 hours 05/07/17 0028 05/07/17 0040   05/07/17 2000  cefTRIAXone (ROCEPHIN) 1 g in dextrose 5 % 50 mL IVPB     1 g 100 mL/hr over 30 Minutes Intravenous Every 24 hours 05/07/17 0040 05/12/17 2329   05/07/17 0030  metroNIDAZOLE (FLAGYL) tablet 500 mg  Status:  Discontinued     500 mg Oral Once 05/07/17 0028 05/07/17 0039   05/07/17 0000  vancomycin (VANCOCIN) IVPB 1000 mg/200 mL premix     1,000 mg 200 mL/hr over 60 Minutes Intravenous  Once 05/06/17 2348 05/07/17 0100   05/06/17 2230  ceFEPIme (MAXIPIME) 2 g in dextrose 5 % 50 mL IVPB     2 g 100 mL/hr over 30 Minutes Intravenous STAT 05/06/17 2224 05/06/17 2310      Subjective: Seen and examined at beside and stated her diarrhea was improved but not resolved. No nausea or vomiting. No CP or SOB. Had some rectal pain but stated it was manageable. No other complaints or concerns.    Objective: Vitals:   05/12/17 0432 05/12/17 1259 05/12/17 2006 05/13/17 0529  BP:  102/64 (!) 102/57 131/69  Pulse:  77 78 93  Resp:    18  Temp:  97.5 F (36.4 C) 97.6 F (36.4 C) 98.2 F (36.8 C)  TempSrc:  Oral Oral Oral  SpO2:  98% 100% 99%  Weight: 58.5 kg (128 lb 15.5 oz)   60.4 kg (133 lb 2.5 oz)  Height:        Intake/Output Summary (Last 24 hours) at 05/13/17 0746 Last data filed at 05/13/17 0600  Gross per 24 hour  Intake             2050 ml  Output                0 ml  Net             2050 ml   Filed Weights   05/11/17 0500 05/12/17 0432 05/13/17 0529  Weight: 57.9 kg (127 lb 10.3 oz) 58.5 kg (128 lb 15.5 oz) 60.4 kg (133 lb 2.5 oz)   Examination: Physical Exam:  Constitutional: Thin AAF in NAD who appears calm and comfortable Eyes: Sclerae anicteric. Conjuctivae noninjected ENMT: External Ears and Nose appear normal. Grossly normal hearing Neck: Supple with no visible JVD Respiratory: CTAB with no wheezing/rales/rhonchi Cardiovascular: RRR no m/r/g Abdomen: Soft, NT, ND; Bowel sounds positive GU: Deferred Musculoskeletal: No cyanosis and no clubbing Skin: Warm and dry; did not view perirectal abscess Neurologic: CN2-12 grossly intact and no focal deficits appreciated.  Psychiatric: Pleasant mood and affect. Intact Judgement and insight. A and O x3.   Data Reviewed: I have personally reviewed following labs and imaging studies  CBC:  Recent Labs Lab 05/06/17 2122  05/08/17 0508 05/09/17 0036 05/10/17 0351 05/11/17 0528 05/11/17 1031 05/12/17 0503 05/13/17 0522  WBC 18.3*  < > 14.7* 15.6* 6.0 14.8*  --  7.9 15.5*  NEUTROABS 10.9*  --  9.8* 8.8*  --   --   --   --  10.4*  HGB 11.8*  < > 10.0* 9.5* 13.5 8.3* 8.7* 8.4* 8.7*  HCT  35.1*  < > 30.1* 28.2* 39.1 24.6* 25.7* 24.9* 26.7*  MCV 77.0*  < > 77.0* 76.0* 75.8* 76.4*  --  77.8* 78.1  PLT 451*  < > 338 296 142* 213  --  205 203  < > = values in this interval not displayed. Basic Metabolic Panel:  Recent Labs Lab 05/08/17 0508 05/09/17 0036 05/10/17 0351 05/11/17 0528 05/12/17 0503 05/13/17 0522  NA 141 140 135 135 136 137  K 2.7* 2.4* 3.5 3.5 5.3* 4.5  CL 123* 121* 116* 113* 115* 113*  CO2 8* 11* 12* 15* 15* 19*  GLUCOSE 75 125* 142* 113* 127* 93  BUN 14 10 8 9 10 13   CREATININE 1.29* 1.12* 0.95 0.98 0.90 0.97  CALCIUM 7.9* 8.1* 7.5* 7.4* 7.2* 7.5*  MG 1.4* 1.9  --   --   --  1.2*  PHOS  --   --   --   --   --  2.1*   GFR: Estimated Creatinine Clearance: 48.3 mL/min (by C-G formula based on SCr of 0.97 mg/dL). Liver Function Tests:  Recent Labs Lab 05/06/17 2122 05/08/17 0508 05/11/17 0528 05/13/17 0522  AST 47* 28 18 19   ALT 33 22 17 18   ALKPHOS 92 79 66 67  BILITOT 1.0 0.3 0.4 0.2*  PROT 8.2* 6.8 5.7* 6.1*  ALBUMIN 3.3* 2.7* 2.3* 2.5*   No results for input(s): LIPASE, AMYLASE in the last 168 hours. No results for input(s): AMMONIA in the last 168 hours. Coagulation Profile:  Recent Labs Lab 05/06/17 2133  INR 1.18   Cardiac Enzymes:  Recent Labs Lab 05/07/17 0245 05/07/17 1151  TROPONINI <0.03 <0.03   BNP (last 3 results) No results for input(s): PROBNP in the last 8760 hours. HbA1C: No results for input(s): HGBA1C in the last 72 hours. CBG:  Recent Labs Lab 05/09/17 0756 05/10/17 0749 05/11/17 0802 05/12/17 0755 05/13/17 0729  GLUCAP 109* 135* 101* 114* 81   Lipid Profile: No results for input(s): CHOL, HDL, LDLCALC, TRIG, CHOLHDL, LDLDIRECT in the last 72 hours. Thyroid Function Tests: No results for input(s): TSH, T4TOTAL, FREET4, T3FREE, THYROIDAB in the last 72 hours. Anemia Panel: No results for input(s): VITAMINB12, FOLATE, FERRITIN, TIBC, IRON, RETICCTPCT in the last 72 hours. Sepsis Labs:  Recent  Labs Lab 05/06/17 2255 05/07/17 0029 05/07/17 0245 05/07/17 1351 05/09/17 0036 05/09/17 2109 05/11/17 0528  PROCALCITON  --   --   --  0.73 0.41  --  0.19  LATICACIDVEN 1.78 1.63 2.0*  --   --  1.2  --     Recent Results (from the past 240 hour(s))  Blood Culture (routine x 2)     Status: None   Collection Time: 05/06/17  9:22 PM  Result Value Ref Range Status   Specimen Description BLOOD LEFT ANTECUBITAL  Final   Special Requests   Final    BOTTLES DRAWN AEROBIC AND ANAEROBIC Blood Culture adequate volume   Culture   Final    NO GROWTH 5 DAYS Performed at [facility], [place] [address], [place], [place] [address]    Report Status 05/12/2017 FINAL  Final  Blood Culture (routine x 2)     Status: None   Collection Time: 05/06/17 10:50 PM  Result Value Ref Range Status   Specimen Description BLOOD R UPPER ARM  Final   Special Requests IN PEDIATRIC BOTTLE Blood Culture adequate volume  Final   Culture   Final    NO GROWTH 5 DAYS Performed at [facility], [place] [address] [place],  [address]    Report Status 05/12/2017 FINAL  Final  Urine culture     Status: Abnormal   Collection Time: 05/06/17 11:42 PM  Result  Value Ref Range Status   Specimen Description URINE, RANDOM  Final   Special Requests NONE  Final   Culture MULTIPLE SPECIES PRESENT, SUGGEST RECOLLECTION (A)  Final   Report Status 05/08/2017 FINAL  Final  MRSA PCR Screening     Status: Abnormal   Collection Time: 05/07/17  4:41 AM  Result Value Ref Range Status   MRSA by PCR POSITIVE (A) NEGATIVE Final    Comment:        The GeneXpert MRSA Assay (FDA approved for NASAL specimens only), is one component of a comprehensive MRSA colonization surveillance program. It is not intended to diagnose MRSA infection nor to guide or monitor treatment for MRSA infections. RESULT CALLED TO, READ BACK BY AND VERIFIED WITH: DARK, D. RN @0900  ON 6.1.18 BY NMCCOY   C difficile quick scan w PCR reflex      Status: None   Collection Time: 05/07/17  9:03 AM  Result Value Ref Range Status   C Diff antigen NEGATIVE NEGATIVE Final   C Diff toxin NEGATIVE NEGATIVE Final   C Diff interpretation No C. difficile detected.  Final   Radiology Studies: No results found.   Scheduled Meds: . calcium-vitamin D  1 tablet Oral Daily  . colestipol  2 g Oral BID  . cyanocobalamin  1,000 mcg Intramuscular Q30 days  . diphenoxylate-atropine  2 tablet Oral QID  . feeding supplement (ENSURE ENLIVE)  237 mL Oral BID BM  . ferrous sulfate  325 mg Oral TID WC  . liver oil-zinc oxide  1 application Topical BID  . magic mouthwash w/lidocaine  5 mL Oral QID  . methylPREDNISolone (SOLU-MEDROL) injection  60 mg Intravenous Q24H  . multivitamin with minerals  1 tablet Oral Daily  . nystatin cream   Topical BID  . saccharomyces boulardii  250 mg Oral Daily  . sodium bicarbonate  650 mg Oral BID  . sodium chloride flush  3 mL Intravenous Q12H   Continuous Infusions: . lactated ringers with kcl 50 mL/hr at 05/12/17 2249    LOS: 6 days    Kerney Elbe, DO [facility] Pager [phone]  If 7PM-7AM, please contact night-coverage [email or web address] Password [facility] 05/13/2017, 7:46 AM

## 2017-05-13 NOTE — Progress Notes (Addendum)
Pt is active with [facility], HHRN/PT. Referral given to in house rep.

## 2017-05-14 DIAGNOSIS — I959 Hypotension, unspecified: Secondary | ICD-10-CM

## 2017-05-14 LAB — CBC WITH DIFFERENTIAL/PLATELET
BASOS ABS: 0 10*3/uL (ref 0.0–0.1)
BASOS PCT: 0 %
Eosinophils Absolute: 0 10*3/uL (ref 0.0–0.7)
Eosinophils Relative: 0 %
HEMATOCRIT: 25.8 % — AB (ref 36.0–46.0)
Hemoglobin: 8.6 g/dL — ABNORMAL LOW (ref 12.0–15.0)
Lymphocytes Relative: 26 %
Lymphs Abs: 3.7 10*3/uL (ref 0.7–4.0)
MCH: 26.4 pg (ref 26.0–34.0)
MCHC: 33.3 g/dL (ref 30.0–36.0)
MCV: 79.1 fL (ref 78.0–100.0)
MONO ABS: 1.3 10*3/uL — AB (ref 0.1–1.0)
Monocytes Relative: 9 %
NEUTROS ABS: 9.1 10*3/uL — AB (ref 1.7–7.7)
Neutrophils Relative %: 65 %
PLATELETS: 194 10*3/uL (ref 150–400)
RBC: 3.26 MIL/uL — ABNORMAL LOW (ref 3.87–5.11)
RDW: 18.7 % — AB (ref 11.5–15.5)
WBC: 14.1 10*3/uL — ABNORMAL HIGH (ref 4.0–10.5)

## 2017-05-14 LAB — GLUCOSE, CAPILLARY: Glucose-Capillary: 85 mg/dL (ref 65–99)

## 2017-05-14 LAB — COMPREHENSIVE METABOLIC PANEL
ALT: 21 U/L (ref 14–54)
AST: 21 U/L (ref 15–41)
Albumin: 2.5 g/dL — ABNORMAL LOW (ref 3.5–5.0)
Alkaline Phosphatase: 70 U/L (ref 38–126)
Anion gap: 4 — ABNORMAL LOW (ref 5–15)
BILIRUBIN TOTAL: 0.3 mg/dL (ref 0.3–1.2)
BUN: 11 mg/dL (ref 6–20)
CHLORIDE: 112 mmol/L — AB (ref 101–111)
CO2: 20 mmol/L — AB (ref 22–32)
Calcium: 7.2 mg/dL — ABNORMAL LOW (ref 8.9–10.3)
Creatinine, Ser: 0.9 mg/dL (ref 0.44–1.00)
GFR calc Af Amer: >60 mL/min (ref >60)
GFR calc non Af Amer: >60 mL/min (ref >60)
Glucose, Bld: 96 mg/dL (ref 65–99)
POTASSIUM: 4.3 mmol/L (ref 3.5–5.1)
Sodium: 136 mmol/L (ref 135–145)
Total Protein: 5.7 g/dL — ABNORMAL LOW (ref 6.5–8.1)

## 2017-05-14 LAB — MAGNESIUM: Magnesium: 2.2 mg/dL (ref 1.7–2.4)

## 2017-05-14 LAB — PHOSPHORUS: Phosphorus: 1.9 mg/dL — ABNORMAL LOW (ref 2.5–4.6)

## 2017-05-14 MED ORDER — SODIUM PHOSPHATES 45 MMOLE/15ML IV SOLN
30.0000 mmol | Freq: Once | INTRAVENOUS | Status: AC
  Administered 2017-05-14: 30 mmol via INTRAVENOUS
  Filled 2017-05-14: qty 10

## 2017-05-14 MED ORDER — FERROUS SULFATE 325 (65 FE) MG PO TABS: 325.0000 mg | ORAL_TABLET | Freq: Three times a day (TID) | ORAL | 3 refills | Status: DC

## 2017-05-14 MED ORDER — PREDNISONE 20 MG PO TABS: 40.0000 mg | ORAL_TABLET | Freq: Every day | ORAL | 0 refills | Status: DC

## 2017-05-14 MED ORDER — LOPERAMIDE HCL 2 MG PO CAPS: 4.0000 mg | ORAL_CAPSULE | Freq: Four times a day (QID) | ORAL | 0 refills | Status: DC

## 2017-05-14 MED ORDER — ACETAMINOPHEN 325 MG PO TABS: 650.0000 mg | ORAL_TABLET | Freq: Four times a day (QID) | ORAL | 0 refills | Status: DC | PRN

## 2017-05-14 MED ORDER — SODIUM BICARBONATE 650 MG PO TABS
650.0000 mg | ORAL_TABLET | Freq: Two times a day (BID) | ORAL | 0 refills | Status: DC
Start: 2017-05-14 — End: 2017-06-17

## 2017-05-14 MED ORDER — COLESTIPOL HCL 1 G PO TABS: 2.0000 g | ORAL_TABLET | Freq: Two times a day (BID) | ORAL | 0 refills | Status: DC

## 2017-05-14 MED ORDER — TRAMADOL HCL 50 MG PO TABS
50.0000 mg | ORAL_TABLET | Freq: Once | ORAL | Status: AC
  Administered 2017-05-14: 50 mg via ORAL
  Filled 2017-05-14: qty 1

## 2017-05-14 MED ORDER — MAGIC MOUTHWASH W/LIDOCAINE: 5.0000 mL | Freq: Four times a day (QID) | ORAL | 0 refills | Status: DC

## 2017-05-14 MED ORDER — ZINC OXIDE 20 % EX OINT: 1.0000 "application " | TOPICAL_OINTMENT | Freq: Two times a day (BID) | CUTANEOUS | 0 refills | Status: DC

## 2017-05-14 NOTE — Discharge Summary (Signed)
Physician Discharge Summary  MALAYLA GRANBERRY SMO:[id number] DOB: 1946/02/12 DOA: 05/06/2017  PCP: Iona Beard, MD  Admit date: 05/06/2017 Discharge date: 05/14/2017  Admitted From: Home Disposition:  Home with Home Health  Recommendations for Outpatient Follow-up:  1. Follow up with PCP in 1-2 weeks 2. Follow up with Primary Gastroenterologist Dr. Laural Golden as an outpatient for hospital Follow up and re-initiation of Remicade Infusions  3. Follow up with Dr. Leighton Ruff in General Surgery 1-2 weeks after D/C 4. Please obtain CMP/CBC, Mag, Phos in one week  Home Health: Yes -PT/RN Equipment/Devices:   Discharge Condition: Stable  CODE STATUS: FULL CODE Diet recommendation: Regular   Brief/Interim Summary: Patient is a 71 year old female with medical history significant for Crohn's disease, Chronic Kidney Disease stage III, history of rectovaginal fistula and rectal abscess that was recently drained  presented to the emergency department with shortness of breath, generalized weakness, dysuria and burning on urination.   Patient was admitted for suspected sepsis with hypoxemia and acute renal failure. She had a positive d-dimer and VQ scan was done which showed low probability for pulmonary embolism. Doppler of the lower extremity was negative for DVT. Heparin drip was discontinued, she was found to have metabolic acidosis, which was leading to tachypnea. Bicarb ggt was started with improvement of her respiratory status.  General Surgery was consulted for her perirectal abscess and they felt her main issue was continued stool drainage secondary to her anovaginal fistula which is exacerbated by loose stools. Patient states that when her stools were solidified this was not much for problem for her. General Surgery Recommended that she needed better control of her diarrhea and if cannot be controlled she could eventually need a diverting colostomy.   Patient started to have significant diarrhea  from Crohn's. General Surgery was consulted for her perirectal abscess and they felt her main issue was continued stool drainage secondary to her anovaginal fistula which is exacerbated by loose stools. Patient states that when her stools were solidified this was not much for problem for her. General Surgery Recommended that she needed better control of her diarrhea and if cannot be controlled she could eventually need a diverting colostomy.  Patient started on po steroids then IV and GI was consulted. Discussed with GI and transitioned to po Prednisone this AM. Patient feels as if Diarrhea is slowly improving however GI does not feel like her symptoms are from Acute Crohns flare but rather bacterial Intestinal overgrowth from IV Abx use for her UTI and have recommended stopping Lomotil at this point and scheduling Loperamide. Patient improved and diarrhea improved and was almost back to baseline. Patient was deemed medically stable and will need to follow up with her PCP, GI, and General Surgery as an outpatient. Patient will be going home with Home Health PT and RN.   Discharge Diagnoses:  Principal Problem:   Acute respiratory failure with hypoxia (HCC) Active Problems:   Leukocytosis   Perianal Crohn's disease, with fistula (HCC)   Acute renal failure superimposed on stage 3 chronic kidney disease (HCC)   Immunosuppressed status (HCC)   Crohn disease (HCC)   Perineal irritation in female   Hypokalemia   Hypotension   Hiatal hernia with GERD   UTI (urinary tract infection)   Rectovaginal fistula   Chronic diarrhea   Infliximab (Remicade) long-term use   Anorexia   Weight loss, unintentional  Acute respiratory with Hypoxia, Resolved -Multifactorial fromsepsis 2/2 UTI and metabolic acidosis from severe diarrhea, inducing hyperventilation. - Resolved  -  PE Unlikely - initially treated with heparin gtt which was Discontinued on 05/07/17 -Was Treatingacidosis with bicarbonate drip, added  Bicarb tablets as Bicarbonate gtt is now Discontinued -Chest CT with no acute abnormalities -V/Q Scan showd no evident perfusion defects and very low probablity of PE -Off oxygen now -Was on Albuterol 2.5 mg Neb q4hprn for Wheezing and SOB -Continue to monitor Respiratory Status and Maintain O2 Saturations >92%  Sepsis 2/2 UTI, Improved -Sepsis physiology has resolved  -Was On IV Rocephin 1 gram q24h; Completed 7 day Course and Abx Sgtopped -Urine culture multiple species, will not repeat given patient on antibiotics -Blood Cultures x2 showed NGTD at 5 days -PICC Line Removed prior to D/C  Metabolic acidosis secondary to severe diarrhea from Crohn's disease vs Bacterial Overgrowth  -HCO3 slowly is improving and currently 20 -Initially treated with Bicarb ggt - transitioned to Bicarb tablets -C/w Sodium Bicarbonate 650 mg po BID at D/C for 5 days -D/C'd Mag as probably contributing to diarrhea.   Hypokalemia  -secondary to diarrhea - Improved and now 4.3 -Was on IVF with LR + 20 mEQ of KCl given diarrhea and acidosis at 50 mL/hr Adding KCL to IVF  -Check CMP in AM  Hypomagnesemia -Mag Level was 1.2 and improved to 2.2 -Continue to Monitor and Replete as Necessary -Repeat Mag Level as an outpatient -Stopped po Mag Oxide as it can worsen diarrhea  Hypophosphatemia -Phos Level this Am was 1.9 -Replete with po K Phos 500 mg TID x 3 doses -Continue to Monitor and Replete as Necessary -Repeat Phos Level as an outpatient   Acute kidney failure on chronic kidney disease stage III, improved  -Improved and stable  -Creatinine on Admission 1.70. Current BUN/Cr is now 11/0.90 -Patient on IVF with LR + 20 mEQ of KCl given diarrhea and acidosis at 50 mL/hr -Check CMP in AM   Acute Crohn's Disease Flare with recent perirectal abscess requiring I&D by Dr. Ninfa Linden on 04/21/17 -Patient was initially started prednisone 40 mg, no significant improvement so was started on IV Solumedrol  60 mg daily; IV Solumedrol 60 mg q24h transitioned to po Prednisone 40 mg at the Recc's of Dr. Cristina Gong -D/C'd Lomotiol 2 tabl po 4 times daily per GI Recc's; C/w  Loperamide 4 mg po q6h scheduled for Diarrhea and Loose Stools and hold for No diarrhea in the preceding 6 hours, Bentyl 20 mg po TIDprn and with Sacchromyces Boulardii 250 mg po Daily -Gastroenterology was consulted and appreciated Recc's -Recommended C/w Colestipol 2 mg po BID -Continue Low Fiber Diet  -C Difficile was Negative -Will need Remicade Infusion soon and can follow up with Primary Gastroenterologist Dr. Laural Golden  -Appreciated Gastroenterology following  -Pain control with Norco 7.5-325 mg po 1 tab q6hprn  Diarrhea From Crohn's Disease vs Bacterial Overgrowth, improving  -As Above -D/C'd Lomotiol 2 tabl po 4 times daily per GI Recc's; C/w  Loperamide 4 mg po q6h scheduled for Diarrhea and Loose Stools and hold for No diarrhea in the preceding 6 hours, Bentyl 20 mg po TIDprn and with Sacchromyces Boulardii 250 mg po Daily -Gastroenterology was consulted and appreciated Recc's -Follow up with Primary Gastroenterologist Dr. Laural Golden   Leukocytosis  -Likely from Crohn's Flare and IV and po Steroid Use -Improved as WBC went from 14.8 -> 7.9 -> 15.5 -> 14.1 -Continue to Monitor for S/Sx of Infection -Repeat CBC as an outpatient   Perirectal Abscess, recently the drain x 2  -Surgery consulted - Appreciated recommendations, -Recommending to control diarrhea  and follow up as outpatient.  -C/w Gerhardt's Butt Cream 1 application Topically 4 times daily prn and Destitin 1 application Topically BID daily  -Per Surgery may ultimately need a diverting colostomy but patient has refused -Pain control with Norco 7.5-325 mg po 1 tab q6hprn -Follow up with Dr. Leighton Ruff in 1-2 weeks after Discharge -Discussed with Surgery PA and it is ok to restart on Remicade Infusions  Anemia of chronic disease/Iron Deficiency Anemia  -Iron  Panel Done and showed Iron level of 28, UIBC of 174, TIBC of 202, Saturation Ratios of 14, Ferritn of 158, Folate of 5.6  -Hgb has trended down but is stable, likely from Crohns -Hb/Hct went from 8.7/25.7 -> 8.4/24.9 -> 8.7/26.7 -> 8.6/25.8 -FOBT positive  -C.w Ferrous Sulfate 325 mg po TIDwm and with Cyanocobalamin 1,000 mcg IM q30 Days  Transfuse if Hgb <7 or symptomatic  -Continue to Monitor and Repeat CBC as an outpatient  ? Psoriasis -C/w Nystatin Topical Powder and Cream for Itching and Irritation   Discharge Instructions  Discharge Instructions    Call MD for:  difficulty breathing, headache or visual disturbances    Complete by:  As directed    Call MD for:  extreme fatigue    Complete by:  As directed    Call MD for:  persistant dizziness or light-headedness    Complete by:  As directed    Call MD for:  persistant nausea and vomiting    Complete by:  As directed    Call MD for:  redness, tenderness, or signs of infection (pain, swelling, redness, odor or green/yellow discharge around incision site)    Complete by:  As directed    Call MD for:  severe uncontrolled pain    Complete by:  As directed    Call MD for:  temperature >100.4    Complete by:  As directed    Diet - low sodium heart healthy    Complete by:  As directed    Increase activity slowly    Complete by:  As directed      Allergies as of 05/14/2017      Reactions   Zithromax [azithromycin] Swelling, Other (See Comments)   Reaction:  Mouth swelling and blisters    Aspirin Other (See Comments)   Reaction:  Stomach cramping    Ibuprofen Other (See Comments)   Pt states that she prefers not to take this medication.    Sulfa Antibiotics Nausea And Vomiting      Medication List    STOP taking these medications   diphenoxylate-atropine 2.5-0.025 MG tablet Commonly known as:  LOMOTIL   magnesium oxide 400 (241.3 Mg) MG tablet Commonly known as:  MAG-OX     TAKE these medications   acetaminophen  325 MG tablet Commonly known as:  TYLENOL Take 2 tablets (650 mg total) by mouth every 6 (six) hours as needed for mild pain, fever or headache. What changed:  medication strength  how much to take  reasons to take this   albuterol 108 (90 Base) MCG/ACT inhaler Commonly known as:  PROVENTIL HFA;VENTOLIN HFA Inhale 2 puffs into the lungs every 6 (six) hours as needed for wheezing.   CALCIUM 600+D 600-400 MG-UNIT tablet Generic drug:  Calcium Carbonate-Vitamin D Take 1 tablet by mouth daily.   colestipol 1 g tablet Commonly known as:  COLESTID Take 2 tablets (2 g total) by mouth 2 (two) times daily.   cyanocobalamin 1000 MCG/ML injection Commonly known as:  (VITAMIN  B-12) Inject 1,000 mcg into the muscle every 30 (thirty) days.   dicyclomine 10 MG/5ML syrup Commonly known as:  BENTYL Take 20 mg by mouth 3 (three) times daily as needed (for GI upset).   ferrous sulfate 325 (65 FE) MG tablet Take 1 tablet (325 mg total) by mouth 3 (three) times daily with meals.   furosemide 40 MG tablet Commonly known as:  LASIX Take 1 tablet (40 mg total) by mouth daily as needed for edema.   Gerhardt's butt cream Crea Apply 1 application topically 4 (four) times daily as needed for irritation.   HYDROcodone-acetaminophen 7.5-325 MG tablet Commonly known as:  NORCO Take 1 tablet by mouth every 12 (twelve) hours as needed for moderate pain.   inFLIXimab 100 MG injection Commonly known as:  REMICADE Inject 100 mg into the vein every 8 (eight) weeks.   loperamide 2 MG capsule Commonly known as:  IMODIUM Take 2 capsules (4 mg total) by mouth every 6 (six) hours.   magic mouthwash w/lidocaine Soln Take 5 mLs by mouth 4 (four) times daily.   multivitamin with minerals Tabs tablet Take 1 tablet by mouth daily.   nystatin powder Generic drug:  nystatin Apply 1 g topically 2 (two) times daily as needed (for itching/irritation). What changed:  Another medication with the same name  was removed. Continue taking this medication, and follow the directions you see here.   pantoprazole 40 MG tablet Commonly known as:  PROTONIX TAKE 1 TABLET (40 MG TOTAL) BY MOUTH 2 (TWO) TIMES DAILY BEFORE A MEAL.   potassium chloride SA 20 MEQ tablet Commonly known as:  K-DUR,KLOR-CON Take 20 mEq by mouth 3 (three) times daily.   predniSONE 20 MG tablet Commonly known as:  DELTASONE Take 2 tablets (40 mg total) by mouth daily with breakfast. Start taking on:  05/15/2017   promethazine 25 MG tablet Commonly known as:  PHENERGAN Take 25 mg by mouth 2 (two) times daily as needed for nausea or vomiting.   saccharomyces boulardii 250 MG capsule Commonly known as:  FLORASTOR Take 250 mg by mouth daily.   sodium bicarbonate 650 MG tablet Take 1 tablet (650 mg total) by mouth 2 (two) times daily.   traMADol 50 MG tablet Commonly known as:  ULTRAM Take 1 tablet (50 mg total) by mouth every 6 (six) hours as needed for moderate pain.   Vitamin D (Ergocalciferol) 50000 units Caps capsule Commonly known as:  DRISDOL Take 1 capsule (50,000 Units total) by mouth every 7 (seven) days. What changed:  when to take this   zinc oxide 20 % ointment Apply 1 application topically 2 (two) times daily.            Durable Medical Equipment        Start     Ordered   05/14/17 1133  For home use only DME Bedside commode  Once    Question:  Patient needs a bedside commode to treat with the following condition  Answer:  Unable to ambulate   05/14/17 1133     Follow-up Information    Leighton Ruff, MD Follow up.   Specialty:  General Surgery Why:  CAll for follow up appointment 1-2 weeks after discharge.   Contact information: [address] [place] [address] [phone]          Allergies  Allergen Reactions  . Zithromax [Azithromycin] Swelling and Other (See Comments)    Reaction:  Mouth swelling and blisters   .  Aspirin Other (See Comments)    Reaction:   Stomach cramping   . Ibuprofen Other (See Comments)    Pt states that she prefers not to take this medication.   . Sulfa Antibiotics Nausea And Vomiting   Consultations:  General Surgery  [facility]  Procedures/Studies: Dg Chest 2 View  Result Date: 05/06/2017 CLINICAL DATA:  70 year old female with generalized weakness for 5 days. Cough. EXAM: CHEST  2 VIEW COMPARISON:  04/29/2017 and earlier. FINDINGS: Semi upright AP and lateral views of the chest. Large chronic gastric containing hiatal hernia. Stable cardiac size and mediastinal contours. Stable lung volumes. No pneumothorax, pulmonary edema, pleural effusion or confluent pulmonary opacity. Chronic right lateral rib fractures. Osteopenia. No acute osseous abnormality identified. Negative visible bowel gas pattern in the abdomen. IMPRESSION: 1.  No acute cardiopulmonary abnormality. 2. Chronic large hiatal hernia with intrathoracic stomach. Electronically Signed   By: Genevie Ann M.D.   On: 05/06/2017 22:00   Dg Chest 2 View  Result Date: 04/29/2017 CLINICAL DATA:  Shortness of breath and headache EXAM: CHEST  2 VIEW COMPARISON:  Chest radiograph 04/23/2017 FINDINGS: There is a massive hiatal hernia, unchanged. The cardiac silhouette is unchanged and within normal limits for size. No focal consolidation or pulmonary edema. No pneumothorax or pleural effusion. IMPRESSION: Unchanged massive hiatal hernia.  No focal airspace disease. Electronically Signed   By: Ulyses Jarred M.D.   On: 04/29/2017 13:40   Dg Chest 2 View  Result Date: 04/23/2017 CLINICAL DATA:  Chest pain EXAM: CHEST  2 VIEW COMPARISON:  02/03/2017, 10/16/2016, 10/06/2016 FINDINGS: Large hiatal hernia containing dilated bowel, increased compared to 10/16/2016, similar compared with 09/18/2016. No pleural effusion or focal consolidation. Stable cardiomediastinal silhouette. No pneumothorax. Surgical clips in the right upper quadrant. IMPRESSION: No focal consolidation or  effusion. Large hiatal hernia containing air filled dilated bowel. Electronically Signed   By: Donavan Foil M.D.   On: 04/23/2017 17:40   Ct Head Wo Contrast  Result Date: 04/25/2017 CLINICAL DATA:  Right facial numbness EXAM: CT HEAD WITHOUT CONTRAST TECHNIQUE: Contiguous axial images were obtained from the base of the skull through the vertex without intravenous contrast. COMPARISON:  Head CT 08/31/2011 FINDINGS: Brain: No mass lesion, intraparenchymal hemorrhage or extra-axial collection. No evidence of acute cortical infarct. 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 Brain parenchyma is otherwise normal for age. Vascular: No hyperdense vessel or unexpected calcification. Skull: Normal visualized skull base, calvarium and extracranial soft tissues. Sinuses/Orbits: No sinus fluid levels or advanced mucosal thickening. No mastoid effusion. Normal orbits. IMPRESSION: Unchanged left internal capsule lacunar infarct compared to 08/31/2011. No acute intracranial abnormality. Electronically Signed   By: Ulyses Jarred M.D.   On: 04/25/2017 03:43   Ct Chest Wo Contrast  Result Date: 05/08/2017 CLINICAL DATA:  Dyspnea.  Suspected sepsis. EXAM: CT CHEST WITHOUT CONTRAST TECHNIQUE: Multidetector CT imaging of the chest was performed following the standard protocol without IV contrast. COMPARISON:  Abdominal CT 02/03/2017 FINDINGS: Cardiovascular: The heart is displaced anteriorly. No cardiomegaly or pericardial effusion. LAD atherosclerotic calcification. No acute finding in the great vessels. 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 This degree of herniation has been seen since at least 2014 chest CT. No pneumomediastinum. No adenopathy or mass. Lungs/Pleura: Motion degraded evaluation. Minimal subpleural atelectasis or scarring. There is no edema, consolidation, effusion, or pneumothorax. Upper  Abdomen: No acute finding.  Cholecystectomy clips. Musculoskeletal: No acute finding. Exaggerated  thoracic kyphosis. Remote L1 inferior endplate deformity. IMPRESSION: 1. No acute finding.  Negative for pneumonia. 2. Chronic large hiatal hernia containing stomach and small bowel. Electronically Signed   By: Monte Fantasia M.D.   On: 05/08/2017 13:14   Mr Jodene Nam Head Wo Contrast  Result Date: 04/25/2017 CLINICAL DATA:  Patient hospitalized 4 days ago for I & D of perirectal abscess. New onset of right-sided facial numbness and tingling beginning at 2:40 a.m. today. Possible right facial droop. The examination had to be discontinued prior to completion due to patient refusal for further imaging. Diffusion-weighted imaging, sagittal T1 weighted sequences, axial gradient sequences, and MRA time-of-flight sequences were performed. EXAM: MRI HEAD WITHOUT CONTRAST MRA HEAD WITHOUT CONTRAST TECHNIQUE: Multiplanar, multiecho pulse sequences of the brain and surrounding structures were obtained without intravenous contrast. Angiographic images of the head were obtained using MRA technique without contrast. COMPARISON:  CT head without contrast from the same day. FINDINGS: MRI HEAD FINDINGS Brain: A remote lacunar infarct is again noted in the anterior limb of the left internal capsule. The diffusion-weighted images demonstrate no acute or subacute infarction. Additional sequences were not performed. Vascular: Flow is present in the major intracranial arteries. Skull and upper cervical spine: The craniocervical junction is normal. Midline sagittal structures are unremarkable. Sinuses/Orbits: The paranasal sinuses and mastoid air cells are clear. The globes and orbits are unremarkable. MRA HEAD FINDINGS The internal carotid artery is are within normal limits the high cervical segments through the ICA termini bilaterally. The A1 and M1 segments are normal. Anterior communicating artery is patent. ACA can't MCA branch vessels  are within normal limits. The vertebral arteries are codominant. The right PICA origin is visualized and normal. The left PICA is not visualized. The left AICA is dominant. Basilar artery is normal. Both posterior cerebral arteries originate basilar tip. PCA branch vessels are within normal limits. IMPRESSION: 1. No acute intracranial abnormality. The study was discontinued early as the patient refused to complete the exam. 2. 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 3. Normal variant MRA circle of Willis without significant proximal stenosis, aneurysm, or branch vessel occlusion. Electronically Signed   By: San Morelle M.D.   On: 04/25/2017 11:13   Mr Brain Wo Contrast  Result Date: 04/25/2017 CLINICAL DATA:  Patient hospitalized 4 days ago for I & D of perirectal abscess. New onset of right-sided facial numbness and tingling beginning at 2:40 a.m. today. Possible right facial droop. The examination had to be discontinued prior to completion due to patient refusal for further imaging. Diffusion-weighted imaging, sagittal T1 weighted sequences, axial gradient sequences, and MRA time-of-flight sequences were performed. EXAM: MRI HEAD WITHOUT CONTRAST MRA HEAD WITHOUT CONTRAST TECHNIQUE: Multiplanar, multiecho pulse sequences of the brain and surrounding structures were obtained without intravenous contrast. Angiographic images of the head were obtained using MRA technique without contrast. COMPARISON:  CT head without contrast from the same day. FINDINGS: MRI HEAD FINDINGS Brain: A remote lacunar infarct is again noted in the anterior limb of the left internal capsule. The diffusion-weighted images demonstrate no acute or subacute infarction. Additional sequences were not performed. Vascular: Flow is present in the major intracranial arteries. Skull and upper cervical spine: The craniocervical junction is normal. Midline sagittal structures are unremarkable. Sinuses/Orbits: The  paranasal sinuses and mastoid air cells are clear. The globes and orbits are unremarkable. MRA HEAD FINDINGS The internal carotid artery is are within normal limits the high cervical segments through the ICA termini bilaterally. The A1 and  M1 segments are normal. Anterior communicating artery is patent. ACA can't MCA branch vessels are within normal limits. The vertebral arteries are codominant. The right PICA origin is visualized and normal. The left PICA is not visualized. The left AICA is dominant. Basilar artery is normal. Both posterior cerebral arteries originate basilar tip. PCA branch vessels are within normal limits. IMPRESSION: 1. No acute intracranial abnormality. The study was discontinued early as the patient refused to complete the exam. 2. 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 3. Normal variant MRA circle of Willis without significant proximal stenosis, aneurysm, or branch vessel occlusion. Electronically Signed   By: San Morelle M.D.   On: 04/25/2017 11:13   Nm Pulmonary Perfusion  Result Date: 05/07/2017 CLINICAL DATA:  Shortness of breath EXAM: NUCLEAR MEDICINE  PERFUSION LUNG SCAN TECHNIQUE: Perfusion images were obtained in multiple projections after intravenous injection of radiopharmaceutical. Sequential dynamic ventilation images were obtained in standard planar projections following inhalation of radiopharmaceutical. RADIOPHARMACEUTICALS:  4.2 mCi Tc41m MAA IV COMPARISON:  Chest radiograph May 06, 2017 FINDINGS: Patient was unable to tolerate ventilation study. Perfusion: Radiotracer uptake is homogeneous and symmetric bilaterally. No perfusion defects are evident. IMPRESSION: No evident perfusion defects. Very low probability of pulmonary embolus. Note that there is no ventilation study as patient could not tolerate ventilation study. Electronically Signed   By: Lowella Grip III M.D.   On: 05/07/2017 13:20   Ct Maxillofacial W Contrast  Result  Date: 04/26/2017 CLINICAL DATA:  Sudden facial swelling. EXAM: CT MAXILLOFACIAL WITH CONTRAST TECHNIQUE: Multidetector CT imaging of the maxillofacial structures was performed. Multiplanar CT image reconstructions were also generated. A small metallic BB was placed on the right temple in order to reliably differentiate right from left. COMPARISON:  MRI brain 04/25/2017. FINDINGS: Osseous: No fracture or mandibular dislocation. No destructive process. Orbits: Negative. No traumatic or inflammatory finding. Sinuses: Mild mucosal thickening in the maxillary and ethmoid sinuses. No significant opacity or layering fluid. Soft tissues: No evidence for significant cellulitis or subcutaneous edema or air. Limited intracranial: Unremarkable. IMPRESSION: No evidence for significant facial cellulitis, subcutaneous edema, tear, or other significant process. No underlying osseous abnormality. Electronically Signed   By: Staci Righter M.D.   On: 04/26/2017 17:25   Dg Chest Port 1 View  Result Date: 05/07/2017 CLINICAL DATA:  Shortness of breath. EXAM: PORTABLE CHEST 1 VIEW COMPARISON:  Yesterday FINDINGS: Chronic cardiomegaly and gas distended intrathoracic stomach and esophagus. There is no edema, consolidation, effusion, or pneumothorax. No acute osseous finding. IMPRESSION: 1. No acute finding. 2. Chronic gaseous distention of intrathoracic stomach. Electronically Signed   By: Monte Fantasia M.D.   On: 05/07/2017 18:41     Subjective: Seen and examined and stated diarrhea had not resolved but stools were more formed and slowing down. Denied any CP or SOB. No abdominal pain. No other concerns or complaints at this time and patient is ready to go home.  Discharge Exam: Vitals:   05/14/17 0702 05/14/17 1405  BP: (!) 99/52 (!) 108/54  Pulse: 78 71  Resp: 16 16  Temp: 98.1 F (36.7 C) 97.9 F (36.6 C)   Vitals:   05/13/17 2109 05/14/17 0702 05/14/17 0741 05/14/17 1405  BP: (!) 109/58 (!) 99/52  (!) 108/54   Pulse: 70 78  71  Resp: 16 16  16   Temp: 98.4 F (36.9 C) 98.1 F (36.7 C)  97.9 F (36.6 C)  TempSrc: Oral Oral  Oral  SpO2: 100% 100%  100%  Weight:   67.2 kg (148 lb 2.4 oz)   Height:       General: Pt is a thin AAF who is alert, awake, not in acute distress Cardiovascular: RRR, S1/S2 +, no rubs, no gallops Respiratory: CTA bilaterally, no wheezing, no rhonchi Abdominal: Soft, NT, ND, bowel sounds + Extremities: no edema, no cyanosis; Right Arm had PICC but will be d/c'd prior to D/C  The results of significant diagnostics from this hospitalization (including imaging, microbiology, ancillary and laboratory) are listed below for reference.    Microbiology: Recent Results (from the past 240 hour(s))  Blood Culture (routine x 2)     Status: None   Collection Time: 05/06/17  9:22 PM  Result Value Ref Range Status   Specimen Description BLOOD LEFT ANTECUBITAL  Final   Special Requests   Final    BOTTLES DRAWN AEROBIC AND ANAEROBIC Blood Culture adequate volume   Culture   Final    NO GROWTH 5 DAYS Performed at [facility], [address], [place], [place] [address]    Report Status 05/12/2017 FINAL  Final  Blood Culture (routine x 2)     Status: None   Collection Time: 05/06/17 10:50 PM  Result Value Ref Range Status   Specimen Description BLOOD R UPPER ARM  Final   Special Requests IN PEDIATRIC BOTTLE Blood Culture adequate volume  Final   Culture   Final    NO GROWTH 5 DAYS Performed at [facility], [place] [address] [place], [place] [address]    Report Status 05/12/2017 FINAL  Final  Urine culture     Status: Abnormal   Collection Time: 05/06/17 11:42 PM  Result Value Ref Range Status   Specimen Description URINE, RANDOM  Final   Special Requests NONE  Final   Culture MULTIPLE SPECIES PRESENT, SUGGEST RECOLLECTION (A)  Final   Report Status 05/08/2017 FINAL  Final  MRSA PCR Screening     Status: Abnormal   Collection Time: 05/07/17  4:41 AM  Result  Value Ref Range Status   MRSA by PCR POSITIVE (A) NEGATIVE Final    Comment:        The GeneXpert MRSA Assay (FDA approved for NASAL specimens only), is one component of a comprehensive MRSA colonization surveillance program. It is not intended to diagnose MRSA infection nor to guide or monitor treatment for MRSA infections. RESULT CALLED TO, READ BACK BY AND VERIFIED WITH: DARK, D. RN @0900  ON 6.1.18 BY NMCCOY   C difficile quick scan w PCR reflex     Status: None   Collection Time: 05/07/17  9:03 AM  Result Value Ref Range Status   C Diff antigen NEGATIVE NEGATIVE Final   C Diff toxin NEGATIVE NEGATIVE Final   C Diff interpretation No C. difficile detected.  Final    Labs: BNP (last 3 results)  Recent Labs  05/07/17 0024  BNP 31.5   Basic Metabolic Panel:  Recent Labs Lab 05/08/17 0508 05/09/17 0036 05/10/17 0351 05/11/17 0528 05/12/17 0503 05/13/17 0522 05/14/17 0500  NA 141 140 135 135 136 137 136  K 2.7* 2.4* 3.5 3.5 5.3* 4.5 4.3  CL 123* 121* 116* 113* 115* 113* 112*  CO2 8* 11* 12* 15* 15* 19* 20*  GLUCOSE 75 125* 142* 113* 127* 93 96  BUN 14 10 8 9 10 13 11   CREATININE 1.29* 1.12* 0.95 0.98 0.90 0.97 0.90  CALCIUM 7.9* 8.1* 7.5* 7.4* 7.2* 7.5* 7.2*  MG 1.4* 1.9  --   --   --  1.2* 2.2  PHOS  --   --   --   --   --  2.1* 1.9*   Liver Function Tests:  Recent Labs Lab 05/08/17 0508 05/11/17 0528 05/13/17 0522 05/14/17 0500  AST 28 18 19 21   ALT 22 17 18 21   ALKPHOS 79 66 67 70  BILITOT 0.3 0.4 0.2* 0.3  PROT 6.8 5.7* 6.1* 5.7*  ALBUMIN 2.7* 2.3* 2.5* 2.5*   No results for input(s): LIPASE, AMYLASE in the last 168 hours. No results for input(s): AMMONIA in the last 168 hours. CBC:  Recent Labs Lab 05/08/17 0508 05/09/17 0036 05/10/17 0351 05/11/17 0528 05/11/17 1031 05/12/17 0503 05/13/17 0522 05/14/17 0500  WBC 14.7* 15.6* 6.0 14.8*  --  7.9 15.5* 14.1*  NEUTROABS 9.8* 8.8*  --   --   --   --  10.4* 9.1*  HGB 10.0* 9.5* 13.5 8.3*  8.7* 8.4* 8.7* 8.6*  HCT 30.1* 28.2* 39.1 24.6* 25.7* 24.9* 26.7* 25.8*  MCV 77.0* 76.0* 75.8* 76.4*  --  77.8* 78.1 79.1  PLT 338 296 142* 213  --  205 203 194   Cardiac Enzymes: No results for input(s): CKTOTAL, CKMB, CKMBINDEX, TROPONINI in the last 168 hours. BNP: Invalid input(s): POCBNP CBG:  Recent Labs Lab 05/10/17 0749 05/11/17 0802 05/12/17 0755 05/13/17 0729 05/14/17 0729  GLUCAP 135* 101* 114* 81 85   D-Dimer No results for input(s): DDIMER in the last 72 hours. Hgb A1c No results for input(s): HGBA1C in the last 72 hours. Lipid Profile No results for input(s): CHOL, HDL, LDLCALC, TRIG, CHOLHDL, LDLDIRECT in the last 72 hours. Thyroid function studies No results for input(s): TSH, T4TOTAL, T3FREE, THYROIDAB in the last 72 hours.  Invalid input(s): FREET3 Anemia work up  Recent Labs  05/13/17 0518 05/13/17 1029  FOLATE  --  5.6*  FERRITIN  --  158  TIBC  --  202*  IRON  --  28  RETICCTPCT 1.8  --    Urinalysis    Component Value Date/Time   COLORURINE AMBER (A) 05/06/2017 2342   APPEARANCEUR CLOUDY (A) 05/06/2017 2342   LABSPEC 1.011 05/06/2017 2342   PHURINE 6.0 05/06/2017 2342   GLUCOSEU NEGATIVE 05/06/2017 2342   HGBUR SMALL (A) 05/06/2017 2342   BILIRUBINUR MODERATE (A) 05/06/2017 2342   KETONESUR 5 (A) 05/06/2017 2342   PROTEINUR 100 (A) 05/06/2017 2342   UROBILINOGEN 0.2 07/29/2015 2231   NITRITE NEGATIVE 05/06/2017 2342   LEUKOCYTESUR MODERATE (A) 05/06/2017 2342   Sepsis Labs Invalid input(s): PROCALCITONIN,  WBC,  LACTICIDVEN Microbiology Recent Results (from the past 240 hour(s))  Blood Culture (routine x 2)     Status: None   Collection Time: 05/06/17  9:22 PM  Result Value Ref Range Status   Specimen Description BLOOD LEFT ANTECUBITAL  Final   Special Requests   Final    BOTTLES DRAWN AEROBIC AND ANAEROBIC Blood Culture adequate volume   Culture   Final    NO GROWTH 5 DAYS Performed at [facility], [place] [address] [place], [place] [address]    Report Status 05/12/2017 FINAL  Final  Blood Culture (routine x 2)     Status: None   Collection Time: 05/06/17 10:50 PM  Result Value Ref Range Status   Specimen Description BLOOD R UPPER ARM  Final   Special Requests IN PEDIATRIC BOTTLE Blood Culture adequate volume  Final   Culture   Final    NO GROWTH 5 DAYS Performed at [facility]  [facility], [place] [address] [place], [place] [address]    Report Status 05/12/2017 FINAL  Final  Urine culture     Status: Abnormal   Collection Time: 05/06/17 11:42 PM  Result Value Ref Range Status   Specimen Description URINE, RANDOM  Final   Special Requests NONE  Final   Culture MULTIPLE SPECIES PRESENT, SUGGEST RECOLLECTION (A)  Final   Report Status 05/08/2017 FINAL  Final  MRSA PCR Screening     Status: Abnormal   Collection Time: 05/07/17  4:41 AM  Result Value Ref Range Status   MRSA by PCR POSITIVE (A) NEGATIVE Final    Comment:        The GeneXpert MRSA Assay (FDA approved for NASAL specimens only), is one component of a comprehensive MRSA colonization surveillance program. It is not intended to diagnose MRSA infection nor to guide or monitor treatment for MRSA infections. RESULT CALLED TO, READ BACK BY AND VERIFIED WITH: DARK, D. RN @0900  ON 6.1.18 BY NMCCOY   C difficile quick scan w PCR reflex     Status: None   Collection Time: 05/07/17  9:03 AM  Result Value Ref Range Status   C Diff antigen NEGATIVE NEGATIVE Final   C Diff toxin NEGATIVE NEGATIVE Final   C Diff interpretation No C. difficile detected.  Final   Time coordinating discharge: 35 minutes  SIGNED:  Kerney Elbe, DO [facility] 05/14/2017, 2:13 PM Pager [phone]  If 7PM-7AM, please contact night-coverage [email or web address] Password [email or web address]

## 2017-05-15 DIAGNOSIS — K219 Gastro-esophageal reflux disease without esophagitis: Secondary | ICD-10-CM | POA: Diagnosis not present

## 2017-05-15 DIAGNOSIS — Z8673 Personal history of transient ischemic attack (TIA), and cerebral infarction without residual deficits: Secondary | ICD-10-CM | POA: Diagnosis not present

## 2017-05-15 DIAGNOSIS — N183 Chronic kidney disease, stage 3 (moderate): Secondary | ICD-10-CM | POA: Diagnosis not present

## 2017-05-15 DIAGNOSIS — L0231 Cutaneous abscess of buttock: Secondary | ICD-10-CM | POA: Diagnosis not present

## 2017-05-15 DIAGNOSIS — K611 Rectal abscess: Secondary | ICD-10-CM | POA: Diagnosis not present

## 2017-05-15 DIAGNOSIS — K449 Diaphragmatic hernia without obstruction or gangrene: Secondary | ICD-10-CM | POA: Diagnosis not present

## 2017-05-15 DIAGNOSIS — Z48817 Encounter for surgical aftercare following surgery on the skin and subcutaneous tissue: Secondary | ICD-10-CM | POA: Diagnosis not present

## 2017-05-15 DIAGNOSIS — K5090 Crohn's disease, unspecified, without complications: Secondary | ICD-10-CM | POA: Diagnosis not present

## 2017-05-15 DIAGNOSIS — D72829 Elevated white blood cell count, unspecified: Secondary | ICD-10-CM | POA: Diagnosis not present

## 2017-05-17 ENCOUNTER — Other Ambulatory Visit (INDEPENDENT_AMBULATORY_CARE_PROVIDER_SITE_OTHER): Payer: Self-pay | Admitting: Internal Medicine

## 2017-05-25 ENCOUNTER — Other Ambulatory Visit (INDEPENDENT_AMBULATORY_CARE_PROVIDER_SITE_OTHER): Payer: Self-pay | Admitting: Internal Medicine

## 2017-05-26 ENCOUNTER — Encounter (HOSPITAL_COMMUNITY)
Admission: RE | Admit: 2017-05-26 | Discharge: 2017-05-26 | Disposition: A | Discharge status: Home / Self Care | Payer: Medicare HMO | Source: Ambulatory Visit | Attending: Internal Medicine | Admitting: Internal Medicine

## 2017-05-26 DIAGNOSIS — K5000 Crohn's disease of small intestine without complications: Secondary | ICD-10-CM | POA: Insufficient documentation

## 2017-05-26 MED ORDER — ACETAMINOPHEN 325 MG PO TABS
ORAL_TABLET | ORAL | Status: AC
  Filled 2017-05-26: qty 2

## 2017-05-26 MED ORDER — LORATADINE 10 MG PO TABS
ORAL_TABLET | ORAL | Status: AC
  Filled 2017-05-26: qty 1

## 2017-05-26 MED ORDER — LORATADINE 10 MG PO TABS
10.0000 mg | ORAL_TABLET | Freq: Once | ORAL | Status: AC
  Administered 2017-05-26: 10 mg via ORAL

## 2017-05-26 MED ORDER — SODIUM CHLORIDE 0.9 % IV SOLN
5.0000 mg/kg | Freq: Once | INTRAVENOUS | Status: AC
  Administered 2017-05-26: 300 mg via INTRAVENOUS
  Filled 2017-05-26: qty 30

## 2017-05-26 MED ORDER — ACETAMINOPHEN 325 MG PO TABS
650.0000 mg | ORAL_TABLET | Freq: Four times a day (QID) | ORAL | Status: DC | PRN
  Administered 2017-05-26: 650 mg via ORAL

## 2017-05-26 MED ORDER — SODIUM CHLORIDE 0.9 % IV SOLN
INTRAVENOUS | Status: DC
  Administered 2017-05-26: 250 mL via INTRAVENOUS

## 2017-06-01 ENCOUNTER — Other Ambulatory Visit (INDEPENDENT_AMBULATORY_CARE_PROVIDER_SITE_OTHER): Payer: Self-pay | Admitting: Internal Medicine

## 2017-06-01 DIAGNOSIS — N183 Chronic kidney disease, stage 3 (moderate): Secondary | ICD-10-CM | POA: Diagnosis not present

## 2017-06-01 DIAGNOSIS — K5000 Crohn's disease of small intestine without complications: Secondary | ICD-10-CM | POA: Diagnosis not present

## 2017-06-01 DIAGNOSIS — K219 Gastro-esophageal reflux disease without esophagitis: Secondary | ICD-10-CM | POA: Diagnosis not present

## 2017-06-01 DIAGNOSIS — D510 Vitamin B12 deficiency anemia due to intrinsic factor deficiency: Secondary | ICD-10-CM | POA: Diagnosis not present

## 2017-06-08 ENCOUNTER — Emergency Department (HOSPITAL_COMMUNITY): Payer: Medicare HMO

## 2017-06-08 ENCOUNTER — Encounter (HOSPITAL_COMMUNITY): Payer: Self-pay | Admitting: Emergency Medicine

## 2017-06-08 ENCOUNTER — Inpatient Hospital Stay (HOSPITAL_COMMUNITY)
Admission: EM | Admit: 2017-06-08 | Discharge: 2017-06-17 | DRG: 391 | Disposition: A | Discharge status: Home Health | Payer: Medicare HMO | Attending: Internal Medicine | Admitting: Internal Medicine

## 2017-06-08 DIAGNOSIS — Z87891 Personal history of nicotine dependence: Secondary | ICD-10-CM | POA: Diagnosis not present

## 2017-06-08 DIAGNOSIS — Z833 Family history of diabetes mellitus: Secondary | ICD-10-CM

## 2017-06-08 DIAGNOSIS — E872 Acidosis: Secondary | ICD-10-CM | POA: Diagnosis not present

## 2017-06-08 DIAGNOSIS — E860 Dehydration: Secondary | ICD-10-CM | POA: Diagnosis present

## 2017-06-08 DIAGNOSIS — R8271 Bacteriuria: Secondary | ICD-10-CM | POA: Diagnosis present

## 2017-06-08 DIAGNOSIS — I9589 Other hypotension: Secondary | ICD-10-CM | POA: Diagnosis present

## 2017-06-08 DIAGNOSIS — K50919 Crohn's disease, unspecified, with unspecified complications: Secondary | ICD-10-CM | POA: Diagnosis not present

## 2017-06-08 DIAGNOSIS — N179 Acute kidney failure, unspecified: Secondary | ICD-10-CM | POA: Diagnosis not present

## 2017-06-08 DIAGNOSIS — Z880 Allergy status to penicillin: Secondary | ICD-10-CM

## 2017-06-08 DIAGNOSIS — Z681 Body mass index (BMI) 19 or less, adult: Secondary | ICD-10-CM | POA: Diagnosis not present

## 2017-06-08 DIAGNOSIS — J9601 Acute respiratory failure with hypoxia: Secondary | ICD-10-CM | POA: Diagnosis not present

## 2017-06-08 DIAGNOSIS — K222 Esophageal obstruction: Secondary | ICD-10-CM | POA: Diagnosis not present

## 2017-06-08 DIAGNOSIS — R112 Nausea with vomiting, unspecified: Secondary | ICD-10-CM | POA: Diagnosis not present

## 2017-06-08 DIAGNOSIS — K50019 Crohn's disease of small intestine with unspecified complications: Secondary | ICD-10-CM | POA: Diagnosis not present

## 2017-06-08 DIAGNOSIS — R1310 Dysphagia, unspecified: Secondary | ICD-10-CM | POA: Diagnosis not present

## 2017-06-08 DIAGNOSIS — D649 Anemia, unspecified: Secondary | ICD-10-CM | POA: Diagnosis not present

## 2017-06-08 DIAGNOSIS — D72829 Elevated white blood cell count, unspecified: Secondary | ICD-10-CM | POA: Diagnosis not present

## 2017-06-08 DIAGNOSIS — N390 Urinary tract infection, site not specified: Secondary | ICD-10-CM | POA: Diagnosis present

## 2017-06-08 DIAGNOSIS — K5090 Crohn's disease, unspecified, without complications: Secondary | ICD-10-CM | POA: Diagnosis present

## 2017-06-08 DIAGNOSIS — L409 Psoriasis, unspecified: Secondary | ICD-10-CM | POA: Diagnosis present

## 2017-06-08 DIAGNOSIS — M62830 Muscle spasm of back: Secondary | ICD-10-CM | POA: Diagnosis not present

## 2017-06-08 DIAGNOSIS — E8779 Other fluid overload: Secondary | ICD-10-CM | POA: Diagnosis not present

## 2017-06-08 DIAGNOSIS — R0602 Shortness of breath: Secondary | ICD-10-CM | POA: Diagnosis not present

## 2017-06-08 DIAGNOSIS — N183 Chronic kidney disease, stage 3 unspecified: Secondary | ICD-10-CM | POA: Diagnosis present

## 2017-06-08 DIAGNOSIS — K5010 Crohn's disease of large intestine without complications: Secondary | ICD-10-CM | POA: Diagnosis present

## 2017-06-08 DIAGNOSIS — D509 Iron deficiency anemia, unspecified: Secondary | ICD-10-CM | POA: Diagnosis present

## 2017-06-08 DIAGNOSIS — E876 Hypokalemia: Secondary | ICD-10-CM | POA: Diagnosis not present

## 2017-06-08 DIAGNOSIS — K219 Gastro-esophageal reflux disease without esophagitis: Secondary | ICD-10-CM | POA: Diagnosis present

## 2017-06-08 DIAGNOSIS — Z800 Family history of malignant neoplasm of digestive organs: Secondary | ICD-10-CM

## 2017-06-08 DIAGNOSIS — E869 Volume depletion, unspecified: Secondary | ICD-10-CM | POA: Diagnosis present

## 2017-06-08 DIAGNOSIS — E873 Alkalosis: Secondary | ICD-10-CM | POA: Diagnosis not present

## 2017-06-08 DIAGNOSIS — R1110 Vomiting, unspecified: Secondary | ICD-10-CM | POA: Diagnosis present

## 2017-06-08 DIAGNOSIS — K449 Diaphragmatic hernia without obstruction or gangrene: Secondary | ICD-10-CM | POA: Diagnosis not present

## 2017-06-08 DIAGNOSIS — E43 Unspecified severe protein-calorie malnutrition: Secondary | ICD-10-CM | POA: Diagnosis not present

## 2017-06-08 DIAGNOSIS — Z8249 Family history of ischemic heart disease and other diseases of the circulatory system: Secondary | ICD-10-CM

## 2017-06-08 DIAGNOSIS — Z452 Encounter for adjustment and management of vascular access device: Secondary | ICD-10-CM | POA: Diagnosis not present

## 2017-06-08 DIAGNOSIS — Z79899 Other long term (current) drug therapy: Secondary | ICD-10-CM

## 2017-06-08 DIAGNOSIS — N823 Fistula of vagina to large intestine: Secondary | ICD-10-CM | POA: Diagnosis not present

## 2017-06-08 LAB — COMPREHENSIVE METABOLIC PANEL
ALBUMIN: 3.6 g/dL (ref 3.5–5.0)
ALK PHOS: 128 U/L — AB (ref 38–126)
ALT: 21 U/L (ref 14–54)
AST: 30 U/L (ref 15–41)
Anion gap: 10 (ref 5–15)
BILIRUBIN TOTAL: 0.6 mg/dL (ref 0.3–1.2)
BUN: 24 mg/dL — ABNORMAL HIGH (ref 6–20)
CALCIUM: 7.6 mg/dL — AB (ref 8.9–10.3)
CO2: 13 mmol/L — AB (ref 22–32)
CREATININE: 1.6 mg/dL — AB (ref 0.44–1.00)
Chloride: 118 mmol/L — ABNORMAL HIGH (ref 101–111)
GFR calc non Af Amer: 32 mL/min — ABNORMAL LOW (ref >60)
GFR, EST AFRICAN AMERICAN: 37 mL/min — AB (ref >60)
GLUCOSE: 98 mg/dL (ref 65–99)
Potassium: 2.8 mmol/L — ABNORMAL LOW (ref 3.5–5.1)
Sodium: 141 mmol/L (ref 135–145)
TOTAL PROTEIN: 8 g/dL (ref 6.5–8.1)

## 2017-06-08 LAB — URINALYSIS, ROUTINE W REFLEX MICROSCOPIC
Bilirubin Urine: NEGATIVE
GLUCOSE, UA: NEGATIVE mg/dL
Ketones, ur: NEGATIVE mg/dL
NITRITE: NEGATIVE
PH: 6 (ref 5.0–8.0)
Protein, ur: 30 mg/dL — AB
SPECIFIC GRAVITY, URINE: 1.01 (ref 1.005–1.030)

## 2017-06-08 LAB — CBC
HCT: 35.4 % — ABNORMAL LOW (ref 36.0–46.0)
Hemoglobin: 11.8 g/dL — ABNORMAL LOW (ref 12.0–15.0)
MCH: 27 pg (ref 26.0–34.0)
MCHC: 33.3 g/dL (ref 30.0–36.0)
MCV: 81 fL (ref 78.0–100.0)
PLATELETS: 195 10*3/uL (ref 150–400)
RBC: 4.37 MIL/uL (ref 3.87–5.11)
RDW: 18.2 % — ABNORMAL HIGH (ref 11.5–15.5)
WBC: 12.4 10*3/uL — ABNORMAL HIGH (ref 4.0–10.5)

## 2017-06-08 LAB — MAGNESIUM: Magnesium: 1.3 mg/dL — ABNORMAL LOW (ref 1.7–2.4)

## 2017-06-08 LAB — LIPASE, BLOOD: Lipase: 52 U/L — ABNORMAL HIGH (ref 11–51)

## 2017-06-08 MED ORDER — ONDANSETRON HCL 4 MG PO TABS: 4.0000 mg | ORAL_TABLET | Freq: Four times a day (QID) | ORAL | Status: DC | PRN

## 2017-06-08 MED ORDER — DEXTROSE 5 % IV SOLN
1.0000 g | INTRAVENOUS | Status: DC
  Administered 2017-06-09 – 2017-06-11 (×4): 1 g via INTRAVENOUS
  Filled 2017-06-08 (×4): qty 10

## 2017-06-08 MED ORDER — ONDANSETRON HCL 4 MG/2ML IJ SOLN
4.0000 mg | Freq: Four times a day (QID) | INTRAMUSCULAR | Status: DC | PRN
  Administered 2017-06-08: 4 mg via INTRAVENOUS
  Filled 2017-06-08: qty 2

## 2017-06-08 MED ORDER — SODIUM CHLORIDE 0.9% FLUSH: 3.0000 mL | Freq: Two times a day (BID) | INTRAVENOUS | Status: DC

## 2017-06-08 MED ORDER — PROMETHAZINE HCL 25 MG/ML IJ SOLN
12.5000 mg | Freq: Once | INTRAMUSCULAR | Status: AC
  Administered 2017-06-08: 12.5 mg via INTRAVENOUS
  Filled 2017-06-08: qty 1

## 2017-06-08 MED ORDER — MEGESTROL ACETATE 400 MG/10ML PO SUSP
400.0000 mg | Freq: Every day | ORAL | Status: DC
  Filled 2017-06-08: qty 10

## 2017-06-08 MED ORDER — FENTANYL CITRATE (PF) 100 MCG/2ML IJ SOLN
25.0000 ug | INTRAMUSCULAR | Status: DC | PRN
  Administered 2017-06-08 – 2017-06-09 (×2): 25 ug via INTRAVENOUS
  Filled 2017-06-08 (×2): qty 2

## 2017-06-08 MED ORDER — ENOXAPARIN SODIUM 30 MG/0.3ML ~~LOC~~ SOLN
30.0000 mg | SUBCUTANEOUS | Status: DC
  Administered 2017-06-09 – 2017-06-10 (×3): 30 mg via SUBCUTANEOUS
  Filled 2017-06-08 (×3): qty 0.3

## 2017-06-08 MED ORDER — PANTOPRAZOLE SODIUM 40 MG IV SOLR
40.0000 mg | Freq: Two times a day (BID) | INTRAVENOUS | Status: DC
  Administered 2017-06-09: 40 mg via INTRAVENOUS
  Filled 2017-06-08: qty 40

## 2017-06-08 MED ORDER — ACETAMINOPHEN 325 MG PO TABS
650.0000 mg | ORAL_TABLET | Freq: Four times a day (QID) | ORAL | Status: DC | PRN
  Administered 2017-06-10 – 2017-06-14 (×2): 650 mg via ORAL
  Filled 2017-06-08 (×2): qty 2

## 2017-06-08 MED ORDER — FENTANYL CITRATE (PF) 100 MCG/2ML IJ SOLN
25.0000 ug | Freq: Once | INTRAMUSCULAR | Status: AC
  Administered 2017-06-08: 25 ug via INTRAVENOUS
  Filled 2017-06-08: qty 2

## 2017-06-08 MED ORDER — ACETAMINOPHEN 650 MG RE SUPP: 650.0000 mg | Freq: Four times a day (QID) | RECTAL | Status: DC | PRN

## 2017-06-08 MED ORDER — POTASSIUM CHLORIDE 10 MEQ/100ML IV SOLN
10.0000 meq | INTRAVENOUS | Status: AC
  Administered 2017-06-08 – 2017-06-09 (×5): 10 meq via INTRAVENOUS
  Filled 2017-06-08 (×5): qty 100

## 2017-06-08 MED ORDER — POTASSIUM CHLORIDE 10 MEQ/100ML IV SOLN
10.0000 meq | Freq: Once | INTRAVENOUS | Status: AC
  Administered 2017-06-08: 10 meq via INTRAVENOUS
  Filled 2017-06-08: qty 100

## 2017-06-08 MED ORDER — SODIUM CHLORIDE 0.9 % IV SOLN
INTRAVENOUS | Status: DC
  Administered 2017-06-08: 23:00:00 via INTRAVENOUS

## 2017-06-08 MED ORDER — SODIUM CHLORIDE 0.9 % IV SOLN
1.0000 g | Freq: Once | INTRAVENOUS | Status: AC
  Administered 2017-06-09: 1 g via INTRAVENOUS
  Filled 2017-06-08: qty 10

## 2017-06-08 MED ORDER — MAGNESIUM SULFATE 2 GM/50ML IV SOLN
2.0000 g | Freq: Once | INTRAVENOUS | Status: AC
  Administered 2017-06-09: 2 g via INTRAVENOUS
  Filled 2017-06-08: qty 50

## 2017-06-08 MED ORDER — SODIUM CHLORIDE 0.9 % IV BOLUS (SEPSIS)
1000.0000 mL | Freq: Once | INTRAVENOUS | Status: AC
  Administered 2017-06-08: 1000 mL via INTRAVENOUS

## 2017-06-08 NOTE — ED Provider Notes (Signed)
[facility] DEPT Provider Note   CSN: [id number] Arrival date & time: 06/08/17  1530     History   Chief Complaint Chief Complaint  Patient presents with  . Emesis    HPI Danielle [name] is a 71 y.o. female with a past medical history of Crohn's colitis. She presents emergency Department with chief complaint of vomiting. She has a recent history of a Crohn's flare that required hospitalization about 3 weeks ago and that 6 weeks ago had a rectal abscess. The patient states that yesterday she began having vomiting. She endorses crampy abdominal pain. She denies diarrhea. She has a history of previous small bowel obstructions and states that this does not feel the same. She also states that this does not feel anything like her history of Crohn's. She states she was trying to take her Phenergan at home, however, she continued to vomit. She has also had a cough. She states that her vomitus has been mostly water and phlegm. She's had been unable to hold down anything by mouth. He denies fevers or chills. She denies urinary symptoms. She does complain of pain in her right shoulder and left lower back.  HPI  Past Medical History:  Diagnosis Date  . Crohn disease ([place])   . Hiatal hernia 12/07/2015  . Psoriasis     Patient Active Problem List   Diagnosis Date Noted  . 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 Chronic diarrhea 05/09/2017  . Infliximab (Remicade) long-term use 05/09/2017  . Schatzki's ring of distal esophagus 05/09/2017  . Anorexia 05/09/2017  . Weight loss, unintentional 05/09/2017  . Acute respiratory failure with hypoxia ([place]) 05/07/2017  . Hypotension 05/07/2017  . Hiatal hernia with GERD 05/07/2017  . UTI (urinary tract infection) 05/07/2017  . Tachycardia 04/27/2017  . Perineal irritation in female 04/27/2017  . Mucosal irritation of oral cavity 04/27/2017  . Hypokalemia 04/27/2017  . Chest pain   . Facial swelling   . Right facial numbness   . Perirectal abscess  04/21/2017  . Left buttock abscess 04/21/2017  . Crohn disease ([place]) 02/19/2017  . Immunosuppressed status ([place]) 12/09/2015  . 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 Hiatal hernia 12/07/2015  . Sepsis due to pneumonia ([place]) 12/06/2015  . Acute renal failure superimposed on stage 3 chronic kidney disease ([place]) 12/06/2015  . Perianal Crohn's disease, with fistula ([place])   . Hypomagnesemia   . ARF (acute renal failure) ([place]) 11/18/2014  . Leukocytosis 11/06/2011    Past Surgical History:  Procedure Laterality Date  . ANAL STENOSIS  ~ 2010  . APPENDECTOMY  1980's  . BOWEL RESECTION  2009  . CHOLECYSTECTOMY  1990's  . COLONOSCOPY  2013  . COLONOSCOPY WITH PROPOFOL N/A 03/12/2017   Procedure: COLONOSCOPY WITH PROPOFOL;  Surgeon: Rogene Houston, MD;  [facility];  Service: Endoscopy;  Laterality: N/A;  2:05-moved up to 1025 per Lelon Frohlich  . COLOSTOMY  2009   DEMASO  . COLOSTOMY TAKEDOWN  2009   "only had it for about 1 month" (08/22/2013)  . ESOPHAGEAL DILATION N/A 03/12/2017   Procedure: ESOPHAGEAL DILATION;  Surgeon: Rogene Houston, MD;  [facility];  Service: Endoscopy;  Laterality: N/A;  . ESOPHAGOGASTRODUODENOSCOPY  11/11/2011   Procedure: ESOPHAGOGASTRODUODENOSCOPY (EGD);  Surgeon: Missy Sabins, MD;  Location: [facility] ENDOSCOPY;  Service: Endoscopy;  Laterality: N/A;  . ESOPHAGOGASTRODUODENOSCOPY  05/18/2012   Procedure: ESOPHAGOGASTRODUODENOSCOPY (EGD);  Surgeon: Rogene Houston, MD;  [facility];  Service: Endoscopy;  Laterality: N/A;  200  . ESOPHAGOGASTRODUODENOSCOPY (EGD) WITH PROPOFOL N/A 03/12/2017   Procedure: ESOPHAGOGASTRODUODENOSCOPY (EGD) WITH PROPOFOL;  Surgeon: Rogene Houston, MD;  [facility];  Service: Endoscopy;  Laterality: N/A;  . FLEXIBLE SIGMOIDOSCOPY  11/11/2011   Procedure: FLEXIBLE SIGMOIDOSCOPY;  Surgeon: Missy Sabins, MD;  Location: [facility];  Service: Endoscopy;  Laterality: N/A;  . goiter  1999  . INCISION AND DRAINAGE PERIRECTAL  ABSCESS N/A 04/21/2017   Procedure: IRRIGATION AND DEBRIDEMENT PERIRECTAL ABSCESS;  Surgeon: Coralie Keens, MD;  Location: [place];  Service: General;  Laterality: N/A;  . LEFT HEART CATHETERIZATION WITH CORONARY ANGIOGRAM N/A 07/09/2014   Procedure: LEFT HEART CATHETERIZATION WITH CORONARY ANGIOGRAM;  Surgeon: Peter M Martinique, MD;  Location: [facility] CATH LAB;  Service: Cardiovascular;  Laterality: N/A;  . PROCTOSCOPY N/A 01/31/2015   Procedure: RIGID PROCTOSCOPY;  Surgeon: Leighton Ruff, MD;  Location: [facility];  Service: General;  Laterality: N/A;  . RECTAL EXAM UNDER ANESTHESIA N/A 01/31/2015   Procedure: RECTAL EXAM UNDER ANESTHESIA WITH PLACEMENT OF A SETON;  Surgeon: Leighton Ruff, MD;  Location: [place] [facility];  Service: General;  Laterality: N/A;    OB History    No data available       Home Medications    Prior to Admission medications   Medication Sig Start Date End Date Taking? Authorizing Provider  acetaminophen (TYLENOL) 325 MG tablet Take 2 tablets (650 mg total) by mouth every 6 (six) hours as needed for mild pain, fever or headache. 05/14/17   Raiford Noble Latif, DO  albuterol (PROVENTIL HFA;VENTOLIN HFA) 108 (90 Base) MCG/ACT inhaler Inhale 2 puffs into the lungs every 6 (six) hours as needed for wheezing. 12/09/15   Rama, Venetia Maxon, MD  Calcium Carbonate-Vitamin D (CALCIUM 600+D) 600-400 MG-UNIT tablet Take 1 tablet by mouth daily.    [provider]  colestipol (COLESTID) 1 g tablet Take 2 tablets (2 g total) by mouth 2 (two) times daily. 05/14/17   Raiford Noble Latif, DO  cyanocobalamin (,VITAMIN B-12,) 1000 MCG/ML injection Inject 1,000 mcg into the muscle every 30 (thirty) days.    [provider]  dicyclomine (BENTYL) 10 MG/5ML syrup Take 20 mg by mouth 3 (three) times daily as needed (for GI upset).    [provider]  diphenoxylate-atropine (LOMOTIL) 2.5-0.025 MG tablet TAKE 1 TABLET BY MOUTH 4 TIMES A DAY AS NEEDED FOR DIARRHEA/LOOSE STOOLS 06/02/17   Setzer,  Rona Ravens, NP  ferrous sulfate 325 (65 FE) MG tablet Take 1 tablet (325 mg total) by mouth 3 (three) times daily with meals. 05/14/17   Sheikh, Omair Latif, DO  fluconazole (DIFLUCAN) 150 MG tablet TAKE 1 TABLET BY MOUTH EVERY DAY 05/25/17   Rehman, Mechele Dawley, MD  furosemide (LASIX) 40 MG tablet Take 1 tablet (40 mg total) by mouth daily as needed for edema. 06/30/16   Rogene Houston, MD  HYDROcodone-acetaminophen (NORCO) 7.5-325 MG tablet Take 1 tablet by mouth every 12 (twelve) hours as needed for moderate pain.    [provider]  Hydrocortisone (GERHARDT'S BUTT CREAM) CREA Apply 1 application topically 4 (four) times daily as needed for irritation.    [provider]  inFLIXimab (REMICADE) 100 MG injection Inject 100 mg into the vein every 8 (eight) weeks.    [provider]  loperamide (IMODIUM) 2 MG capsule Take 2 capsules (4 mg total) by mouth every 6 (six) hours. 05/14/17   Raiford Noble Latif, DO  magic mouthwash w/lidocaine SOLN Take 5 mLs by mouth  4 (four) times daily. 05/14/17   Raiford Noble Latif, DO  Multiple Vitamin (MULTIVITAMIN WITH MINERALS) TABS Take 1 tablet by mouth daily.    [provider]  nystatin (NYSTATIN) powder Apply 1 g topically 2 (two) times daily as needed (for itching/irritation).    [provider]  pantoprazole (PROTONIX) 40 MG tablet TAKE 1 TABLET (40 MG TOTAL) BY MOUTH 2 (TWO) TIMES DAILY BEFORE A MEAL. 05/25/16   Setzer, Rona Ravens, NP  potassium chloride SA (K-DUR,KLOR-CON) 20 MEQ tablet Take 20 mEq by mouth 3 (three) times daily.    [provider]  predniSONE (DELTASONE) 20 MG tablet Take 2 tablets (40 mg total) by mouth daily with breakfast. 05/15/17   Raiford Noble Latif, DO  promethazine (PHENERGAN) 25 MG tablet Take 25 mg by mouth 2 (two) times daily as needed for nausea or vomiting.    [provider]  promethazine (PHENERGAN) 25 MG tablet TAKE 1 TABLET (25 MG TOTAL) BY MOUTH 2 (TWO) TIMES DAILY AS NEEDED  FOR NAUSEA OR VOMITING. 05/17/17   Setzer, Rona Ravens, NP  saccharomyces boulardii (FLORASTOR) 250 MG capsule Take 250 mg by mouth daily.    [provider]  sodium bicarbonate 650 MG tablet Take 1 tablet (650 mg total) by mouth 2 (two) times daily. 05/14/17   Raiford Noble Latif, DO  traMADol (ULTRAM) 50 MG tablet Take 1 tablet (50 mg total) by mouth every 6 (six) hours as needed for moderate pain. 04/29/17   Earnstine Regal, PA-C  Vitamin D, Ergocalciferol, (DRISDOL) 50000 units CAPS capsule Take 1 capsule (50,000 Units total) by mouth every 7 (seven) days. Patient taking differently: Take 50,000 Units by mouth every Monday.  07/19/16   Rogene Houston, MD  zinc oxide 20 % ointment Apply 1 application topically 2 (two) times daily. 05/14/17   Kerney Elbe, DO    Family History Family History  Problem Relation Age of Onset  . Diabetes Mother   . Heart disease Father   . Healthy Sister   . Hypertension Brother   . Colon cancer Brother        died with colon cancer  . Crohn's disease Paternal Aunt     Social History Social History  Substance Use Topics  . Smoking status: Former Smoker    Packs/day: 0.12    Years: 4.00    Types: Cigarettes    Quit date: 10/05/1996  . Smokeless tobacco: Never Used     Comment: 08/22/2013 1 pack every two weeks when she did smoked  . Alcohol use No     Allergies   Zithromax [azithromycin]; Aspirin; Ibuprofen; and Sulfa antibiotics   Review of Systems Review of Systems  Ten systems reviewed and are negative for acute change, except as noted in the HPI.   Physical Exam Updated Vital Signs BP (!) 88/66 (BP Location: Left Arm)   Pulse 98   Temp 97.7 F (36.5 C) (Oral)   Resp 16   Ht 5\' 4"  (1.626 m)   Wt 51.7 kg (114 lb)   SpO2 98%   BMI 19.57 kg/m   Physical Exam  Constitutional: She is oriented to person, place, and time. She appears well-developed and well-nourished. No distress.  Appears  Older than states age  HENT:    Head: Normocephalic and atraumatic.  Eyes: Conjunctivae are normal. No scleral icterus.  Neck: Normal range of motion.  Cardiovascular: Normal rate, regular rhythm and normal heart sounds.  Exam reveals no gallop and no friction  rub.   No murmur heard. Pulmonary/Chest: Effort normal and breath sounds normal. No respiratory distress.  Abdominal: Soft. Bowel sounds are normal. She exhibits no distension and no mass. There is no tenderness. There is no guarding.  Musculoskeletal:  TTP left lower lumbar paraspinals and R shoulder  Neurological: She is alert and oriented to person, place, and time.  Skin: Skin is warm and dry. She is not diaphoretic.  Psychiatric: Her behavior is normal.  Nursing note and vitals reviewed.    ED Treatments / Results  Labs (all labs ordered are listed, but only abnormal results are displayed) Labs Reviewed  CBC - Abnormal; Notable for the following:       Result Value   WBC 12.4 (*)    Hemoglobin 11.8 (*)    HCT 35.4 (*)    RDW 18.2 (*)    All other components within normal limits  LIPASE, BLOOD  COMPREHENSIVE METABOLIC PANEL  URINALYSIS, ROUTINE W REFLEX MICROSCOPIC    EKG  EKG Interpretation None       Radiology No results found.  Procedures Procedures (including critical care time)  Medications Ordered in ED Medications - No data to display   Initial Impression / Assessment and Plan / ED Course  I have reviewed the triage vital signs and the nursing notes.  Pertinent labs & imaging results that were available during my care of the patient were reviewed by me and considered in my medical decision making (see chart for details).     Patient with urinary tract infection. Her vomiting is resolved here. She appears to have dehydration and an elevation in her serum creatinine. Hypokalemia and hypomagnesemia noted. I have ordered repletion. She will be admitted to the hospital for further management. She appears stable throughout her  visit here.  Final Clinical Impressions(s) / ED Diagnoses   Final diagnoses:  Non-intractable vomiting with nausea, unspecified vomiting type  Hypokalemia  Hypomagnesemia  Urinary tract infection without hematuria, site unspecified    New Prescriptions New Prescriptions   No medications on file     Margarita Mail, PA-C 06/08/17 2218    Forde Dandy, MD 06/09/17 [phone]

## 2017-06-08 NOTE — ED Provider Notes (Signed)
Medical screening examination/treatment/procedure(s) were conducted as a shared visit with non-physician practitioner(s) and myself.  I personally evaluated the patient during the encounter.   EKG Interpretation None      71 year old female who presents with nausea and vomiting. History of Crohn's colitis on Remicade infusions. Onset of nausea and vomiting of clear fluid over past 2 days with decreased appetite and weakness. Minimal loose stool, no abdominal pain, no fever or chills. This feels different from when she has had Crohn's flare or bowel obstructions in the past.  Patient frail appearing, but non-toxic and in no acute distress. BP 80-90s, but patient has history of baseline hypotension per family. Blood work notable for dehydration with AKI, hypokalemia and hypomagnesemia. Will give IVF, replete electrolytes and admit for observation.    Forde Dandy, MD 06/09/17 [phone]

## 2017-06-08 NOTE — H&P (Signed)
History and Physical    Danielle [name] ZLD:[id number] DOB: [birth year] DOA: 06/08/2017  Referring MD/NP/PA: Margarita Mail, PA-C PCP: Iona Beard, MD  Patient coming from: home  Chief Complaint: Vomiting  HPI: Danielle [name] is a 71 y.o. female with medical history significant of psoriasis, Crohn's disease (followed by GI, Dr. Laural Golden on remicaide infusion), hiatal hernia, GERD, CKD stage III, and H/O rectovaginal fistula; who presents with complaints of vomiting. Patient was just recently admitted into the hospital 5/31-6/8 sepsis secondary to urinary tract infection and possible Crohn's flare. She had just had a recent perirectal abscess I&D by Dr. Rush Farmer on 5/16. Since being home patient reports having poor oral appetite. Patient reports Megace had helped in the past, but insurances does not cover it. However, yesterday patient began having vomiting with reports achy epigastric abdominal pain. Emesis is nonbloody and is unable to keep any food or liquids down. Tried to take Phenergan without relief of symptoms. Other associated symptoms include feeling achy with cramps in multiple areas of her body. She reports that she is only having 4 loose bowel movements per day and normally has 5-6. Denies having any significant fever, chills, dysuria, or urinary frequency. She doesn't feel that it feels like like a Crohn's flare or one of her previous bowel obstructions.   ED Course: Upon admission to the emergency department patient was seen to be afebrile with blood pressures as low as 88/66 responsive to IV fluids, and all other vital signs within normal limits. Labs revealed WBC 12.4, hemoglobin 11.8, potassium 2.8, chloride 118, CO2 13, BUN 24, creatinine 1.6, lipase 52. Urinalysis showed possible signs of infection. Abdominal x-ray showed no clear signs of a bowel obstruction. Patient was given 2 g of magnesium sulfate, 10 mEq of potassium chloride, 1 L normal saline IV fluids, Phenergan,  and fentanyl  for pain.  Review of Systems: Review of Systems  Constitutional: Positive for malaise/fatigue. Negative for chills and fever.  HENT: Negative for congestion and nosebleeds.   Eyes: Negative for double vision and photophobia.  Respiratory: Positive for cough. Negative for hemoptysis, sputum production, shortness of breath and wheezing.   Cardiovascular: Negative for chest pain and palpitations.  Gastrointestinal: Positive for abdominal pain, diarrhea, nausea and vomiting.  Genitourinary: Negative for dysuria and urgency.  Musculoskeletal: Positive for back pain, joint pain and myalgias. Negative for falls.  Skin: Negative for itching and rash.  Neurological: Positive for weakness. Negative for speech change and focal weakness.  Endo/Heme/Allergies: Negative for polydipsia. Does not bruise/bleed easily.  Psychiatric/Behavioral: Negative for hallucinations. The patient is not nervous/anxious.   All other systems reviewed and are negative.   Past Medical History:  Diagnosis Date  . Crohn disease ([place])   . Hiatal hernia 12/07/2015  . Psoriasis     Past Surgical History:  Procedure Laterality Date  . ANAL STENOSIS  ~ 2010  . APPENDECTOMY  1980's  . BOWEL RESECTION  2009  . CHOLECYSTECTOMY  1990's  . COLONOSCOPY  2013  . COLONOSCOPY WITH PROPOFOL N/A 03/12/2017   Procedure: COLONOSCOPY WITH PROPOFOL;  Surgeon: Rogene Houston, MD;  [facility];  Service: Endoscopy;  Laterality: N/A;  2:05-moved up to 1025 per Lelon Frohlich  . COLOSTOMY  2009   DEMASO  . COLOSTOMY TAKEDOWN  2009   "only had it for about 1 month" (08/22/2013)  . ESOPHAGEAL DILATION N/A 03/12/2017   Procedure: ESOPHAGEAL DILATION;  Surgeon: Rogene Houston, MD;  [facility]: [facility];  Service: Endoscopy;  Laterality: N/A;  . ESOPHAGOGASTRODUODENOSCOPY  11/11/2011   Procedure: ESOPHAGOGASTRODUODENOSCOPY (EGD);  Surgeon: Missy Sabins, MD;  Location[facility] ENDOSCOPY;  Service: Endoscopy;  Laterality: N/A;  .  ESOPHAGOGASTRODUODENOSCOPY  05/18/2012   Procedure: ESOPHAGOGASTRODUODENOSCOPY (EGD);  Surgeon: Rogene Houston, MD;  [facility];  Service: Endoscopy;  Laterality: N/A;  200  . ESOPHAGOGASTRODUODENOSCOPY (EGD) WITH PROPOFOL N/A 03/12/2017   Procedure: ESOPHAGOGASTRODUODENOSCOPY (EGD) WITH PROPOFOL;  Surgeon: Rogene Houston, MD;  [facility];  Service: Endoscopy;  Laterality: N/A;  . FLEXIBLE SIGMOIDOSCOPY  11/11/2011   Procedure: FLEXIBLE SIGMOIDOSCOPY;  Surgeon: Missy Sabins, MD;  Location: [place];  Service: Endoscopy;  Laterality: N/A;  . goiter  1999  . INCISION AND DRAINAGE PERIRECTAL ABSCESS N/A 04/21/2017   Procedure: IRRIGATION AND DEBRIDEMENT PERIRECTAL ABSCESS;  Surgeon: Coralie Keens, MD;  Location: [place];  Service: General;  Laterality: N/A;  . LEFT HEART CATHETERIZATION WITH CORONARY ANGIOGRAM N/A 07/09/2014   Procedure: LEFT HEART CATHETERIZATION WITH CORONARY ANGIOGRAM;  Surgeon: Peter M Martinique, MD;  Location: [facility] CATH LAB;  Service: Cardiovascular;  Laterality: N/A;  . PROCTOSCOPY N/A 01/31/2015   Procedure: RIGID PROCTOSCOPY;  Surgeon: Leighton Ruff, MD;  Location: [place];  Service: General;  Laterality: N/A;  . RECTAL EXAM UNDER ANESTHESIA N/A 01/31/2015   Procedure: RECTAL EXAM UNDER ANESTHESIA WITH PLACEMENT OF A SETON;  Surgeon: Leighton Ruff, MD;  Location: [place];  Service: General;  Laterality: N/A;     reports that she quit smoking about 20 years ago. Her smoking use included Cigarettes. She has a 0.48 pack-year smoking history. She has never used smokeless tobacco. She reports that she does not drink alcohol or use drugs.  Allergies  Allergen Reactions  . Zithromax [Azithromycin] Swelling and Other (See Comments)    Reaction:  Mouth swelling and blisters   . Aspirin Other (See Comments)    Reaction:  Stomach cramping   . Ibuprofen Other (See Comments)    Pt states that she prefers not to take this medication.   . Sulfa Antibiotics Nausea And  Vomiting    Family History  Problem Relation Age of Onset  . Diabetes Mother   . Heart disease Father   . Healthy Sister   . Hypertension Brother   . Colon cancer Brother        died with colon cancer  . Crohn's disease Paternal Aunt     Prior to Admission medications   Medication Sig Start Date End Date Taking? Authorizing Provider  acetaminophen (TYLENOL) 325 MG tablet Take 2 tablets (650 mg total) by mouth every 6 (six) hours as needed for mild pain, fever or headache. 05/14/17  Yes Sheikh, Omair Latif, DO  albuterol (PROVENTIL HFA;VENTOLIN HFA) 108 (90 Base) MCG/ACT inhaler Inhale 2 puffs into the lungs every 6 (six) hours as needed for wheezing. 12/09/15  Yes Rama, Venetia Maxon, MD  Calcium Carbonate-Vitamin D (CALCIUM 600+D) 600-400 MG-UNIT tablet Take 1 tablet by mouth daily.   Yes [provider]  colestipol (COLESTID) 1 g tablet Take 2 tablets (2 g total) by mouth 2 (two) times daily. 05/14/17  Yes Sheikh, Omair Latif, DO  cyanocobalamin (,VITAMIN B-12,) 1000 MCG/ML injection Inject 1,000 mcg into the muscle every 30 (thirty) days.   Yes [provider]  dicyclomine (BENTYL) 10 MG/5ML syrup Take 20 mg by mouth 3 (three) times daily as needed (for GI upset).   Yes [provider]  diphenoxylate-atropine (LOMOTIL) 2.5-0.025 MG tablet TAKE 1 TABLET BY  MOUTH 4 TIMES A DAY AS NEEDED FOR DIARRHEA/LOOSE STOOLS 06/02/17  Yes Setzer, Terri L, NP  furosemide (LASIX) 40 MG tablet Take 1 tablet (40 mg total) by mouth daily as needed for edema. 06/30/16  Yes Rehman, Mechele Dawley, MD  HYDROcodone-acetaminophen (NORCO) 7.5-325 MG tablet Take 1 tablet by mouth every 12 (twelve) hours as needed for moderate pain.   Yes [provider]  Hydrocortisone (GERHARDT'S BUTT CREAM) CREA Apply 1 application topically 4 (four) times daily as needed for irritation.   Yes [provider]  inFLIXimab (REMICADE) 100 MG injection Inject 100 mg into the vein every 8 (eight) weeks.    Yes [provider]  Multiple Vitamin (MULTIVITAMIN WITH MINERALS) TABS Take 1 tablet by mouth daily.   Yes [provider]  nystatin (NYSTATIN) powder Apply 1 g topically 2 (two) times daily as needed (for itching/irritation).   Yes [provider]  potassium chloride SA (K-DUR,KLOR-CON) 20 MEQ tablet Take 20 mEq by mouth 3 (three) times daily.   Yes [provider]  promethazine (PHENERGAN) 25 MG tablet TAKE 1 TABLET (25 MG TOTAL) BY MOUTH 2 (TWO) TIMES DAILY AS NEEDED FOR NAUSEA OR VOMITING. 05/17/17  Yes Setzer, Terri L, NP  saccharomyces boulardii (FLORASTOR) 250 MG capsule Take 250 mg by mouth daily.   Yes [provider]  sodium bicarbonate 650 MG tablet Take 1 tablet (650 mg total) by mouth 2 (two) times daily. 05/14/17  Yes Sheikh, Omair Latif, DO  traMADol (ULTRAM) 50 MG tablet Take 1 tablet (50 mg total) by mouth every 6 (six) hours as needed for moderate pain. 04/29/17  Yes Earnstine Regal, PA-C  Vitamin D, Ergocalciferol, (DRISDOL) 50000 units CAPS capsule Take 1 capsule (50,000 Units total) by mouth every 7 (seven) days. Patient taking differently: Take 50,000 Units by mouth every Monday.  07/19/16  Yes Rehman, Mechele Dawley, MD  ferrous sulfate 325 (65 FE) MG tablet Take 1 tablet (325 mg total) by mouth 3 (three) times daily with meals. Patient not taking: Reported on 06/08/2017 05/14/17   Raiford Noble Latif, DO  fluconazole (DIFLUCAN) 150 MG tablet TAKE 1 TABLET BY MOUTH EVERY DAY Patient not taking: Reported on 06/08/2017 05/25/17   Rogene Houston, MD  loperamide (IMODIUM) 2 MG capsule Take 2 capsules (4 mg total) by mouth every 6 (six) hours. Patient not taking: Reported on 06/08/2017 05/14/17   Raiford Noble Latif, DO  magic mouthwash w/lidocaine SOLN Take 5 mLs by mouth 4 (four) times daily. Patient not taking: Reported on 06/08/2017 05/14/17   Raiford Noble Latif, DO  pantoprazole (PROTONIX) 40 MG tablet TAKE 1 TABLET (40 MG TOTAL) BY MOUTH 2 (TWO)  TIMES DAILY BEFORE A MEAL. 05/25/16   Setzer, Rona Ravens, NP  predniSONE (DELTASONE) 20 MG tablet Take 2 tablets (40 mg total) by mouth daily with breakfast. Patient not taking: Reported on 06/08/2017 05/15/17   Raiford Noble Latif, DO  zinc oxide 20 % ointment Apply 1 application topically 2 (two) times daily. Patient not taking: Reported on 06/08/2017 05/14/17   Kerney Elbe, DO    Physical Exam:  Constitutional: Thin elderly female who appears to be in no acute distress at this time Vitals:   06/08/17 1536 06/08/17 1829  BP: (!) 88/66 111/74  Pulse: 98 99  Resp: 16 18  Temp: 97.7 F (36.5 C)   TempSrc: Oral   SpO2: 98% 100%  Weight: 51.7 kg (114 lb)   Height: 5\' 4"  (1.626 m)  Eyes: PERRL, lids and conjunctivae normal ENMT: Mucous membranes are dry. Posterior pharynx clear of any exudate or lesions.  Neck: normal, supple, no masses, no thyromegaly Respiratory: clear to auscultation bilaterally, no wheezing, no crackles. Normal respiratory effort. No accessory muscle use.  Cardiovascular: Regular rate and rhythm, no murmurs / rubs / gallops. No extremity edema. 2+ pedal pulses. No carotid bruits.  Abdomen: Mild epigastric tenderness, no masses palpated. No hepatosplenomegaly. Bowel sounds positive.  Musculoskeletal: no clubbing / cyanosis. No joint deformity upper and lower extremities. Good ROM, no contractures. Normal muscle tone.  Skin: no rashes, lesions, ulcers. No induration Neurologic: CN 2-12 grossly intact. Sensation intact, DTR normal. Strength 5/5 in all 4.  Psychiatric: Normal judgment and insight. Alert and oriented x 3. Normal mood.     Labs on Admission: I have personally reviewed following labs and imaging studies  CBC:  Recent Labs Lab 06/08/17 1543  WBC 12.4*  HGB 11.8*  HCT 35.4*  MCV 81.0  PLT 540   Basic Metabolic Panel:  Recent Labs Lab 06/08/17 1543  NA 141  K 2.8*  CL 118*  CO2 13*  GLUCOSE 98  BUN 24*  CREATININE 1.60*  CALCIUM 7.6*    MG 1.3*   GFR: Estimated Creatinine Clearance: 27.1 mL/min (A) (by C-G formula based on SCr of 1.6 mg/dL (H)). Liver Function Tests:  Recent Labs Lab 06/08/17 1543  AST 30  ALT 21  ALKPHOS 128*  BILITOT 0.6  PROT 8.0  ALBUMIN 3.6    Recent Labs Lab 06/08/17 1543  LIPASE 52*   No results for input(s): AMMONIA in the last 168 hours. Coagulation Profile: No results for input(s): INR, PROTIME in the last 168 hours. Cardiac Enzymes: No results for input(s): CKTOTAL, CKMB, CKMBINDEX, TROPONINI in the last 168 hours. BNP (last 3 results) No results for input(s): PROBNP in the last 8760 hours. HbA1C: No results for input(s): HGBA1C in the last 72 hours. CBG: No results for input(s): GLUCAP in the last 168 hours. Lipid Profile: No results for input(s): CHOL, HDL, LDLCALC, TRIG, CHOLHDL, LDLDIRECT in the last 72 hours. Thyroid Function Tests: No results for input(s): TSH, T4TOTAL, FREET4, T3FREE, THYROIDAB in the last 72 hours. Anemia Panel: No results for input(s): VITAMINB12, FOLATE, FERRITIN, TIBC, IRON, RETICCTPCT in the last 72 hours. Urine analysis:    Component Value Date/Time   COLORURINE YELLOW 06/08/2017 1539   APPEARANCEUR CLOUDY (A) 06/08/2017 1539   LABSPEC 1.010 06/08/2017 1539   PHURINE 6.0 06/08/2017 1539   GLUCOSEU NEGATIVE 06/08/2017 1539   HGBUR MODERATE (A) 06/08/2017 1539   BILIRUBINUR NEGATIVE 06/08/2017 1539   KETONESUR NEGATIVE 06/08/2017 1539   PROTEINUR 30 (A) 06/08/2017 1539   UROBILINOGEN 0.2 07/29/2015 2231   NITRITE NEGATIVE 06/08/2017 1539   LEUKOCYTESUR LARGE (A) 06/08/2017 1539   Sepsis Labs: No results found for this or any previous visit (from the past 240 hour(s)).   Radiological Exams on Admission: Dg Abd Acute W/chest  Result Date: 06/08/2017 CLINICAL DATA:  Nausea, vomiting and diarrhea EXAM: DG ABDOMEN ACUTE W/ 1V CHEST COMPARISON:  Chest CT from 05/08/2017 FINDINGS: Previously noted herniation of stomach and portions of  small bowel are again identified projecting over the top normal size cardiac silhouette. The lungs are clear. Old right-sided rib fractures involving the right fourth through eighth ribs are noted. The patient is status post cholecystectomy. The bowel gas pattern is unremarkable. Several sutures are also seen in the right mid abdomen. No free air or bowel obstruction is  identified. IMPRESSION: 1. Large hiatal hernia again noted projecting over the cardiac and mediastinal contours. 2. No bowel obstruction. 3. No acute cardiopulmonary disease. Electronically Signed   By: Ashley Royalty M.D.   On: 06/08/2017 17:27    Assessment/Plan Nausea and vomiting: Acute. Patient presents with intractable nausea and vomiting. Suspect symptoms could be secondary to underlying infection as seemed below. - admit to a Telemetry bed - Will need to advance diet as tolerated  - antiemetics prn ausea and vomiting - IV fluids NS at 120ml/hr as tolerated  Suspected UTI: Acute. Urinalysis shows many bacteria, large leukocytes, 6-30 squamous epithelial cells, and TNTC Wbcs. I suspect that this could be an acute urinary tract infection  - check urine culture - Rocephin IV   Leukocytosis: Initial WBC elevated at 12.4. Suspect could be secondary to the above. - Recheck CBC in a.m.  Acute renal failure on chronic kidney: Patient's baseline creatinine is around 0.9, but presents with a creatinine of 1.6 and a BUN of 24. Suspect symptoms likely secondary to dehydration with nausea and vomiting symptoms. - IV fluids as seen above - Recheck BMP in a.m.  Hypokalemia, hypomagnesemia, hypocalcemia: Acute. Initial potassium 2.8, magnesium 1.3, calcium 7.6. Suspect likely cause of patient's achy and cramp feelings due to patient's acute nausea and vomiting symptoms along with chronic diarrhea. Patient was given 2 g of magnesium sulfate and 10 mEq of potassium chloride while in the emergency department.  - Give potassium chloride 50 mEq  IV - Give calcium gluconate 1 g IV - Continue to monitor and replace as needed  Crohn's disease: Patient on Remicade infusions. There are no clear signs of acute Crohn's flare at this time. - Continue probiotic and Lomotil prn diarrhea/loose stools  Transient hypotension: Initial blood pressure is noted to be low at 84/63. - Continue IV fluids as seen above, and/or bolus as needed  -  Hold furosemide  Metabolic acidosis - Continue sodium bicarbonate replacement  Normocytic normochromic anemia: Hemoglobin 11.8 on admission which appears above patient's baseline of 8. Suspected aspect of hemoconcentration due to dehydration. - Continue to monitor  Hiatal hernia with GERD - Continue Protonix   DVT prophylaxis: Lovenox Code Status: full Family Communication: Discussed plan of care with the patient and her sister who is present at bedside Disposition Plan: Likely discharge home in 2-3 days  Consults called: none  Admission status: Inpatient   Norval Morton MD [facility] Pager [phone]  If 7PM-7AM, please contact night-coverage [email or web address] [facility]  06/08/2017, 7:36 PM

## 2017-06-08 NOTE — ED Triage Notes (Addendum)
Patient c/o N/V/D with generalized abdominal pain x2 days. Hx chron's. Family reports hx hypotension.

## 2017-06-08 NOTE — ED Notes (Signed)
ED Provider at bedside. 

## 2017-06-08 NOTE — ED Notes (Signed)
Pt able to pivot to bedside commode to attempt urine.

## 2017-06-08 NOTE — ED Notes (Signed)
Pt states they have had to place a PICC line in the past due to being a hard stick and has requested IV team come to access vein.  Order placed.  PA aware.

## 2017-06-09 DIAGNOSIS — R112 Nausea with vomiting, unspecified: Secondary | ICD-10-CM

## 2017-06-09 DIAGNOSIS — N390 Urinary tract infection, site not specified: Secondary | ICD-10-CM | POA: Diagnosis present

## 2017-06-09 LAB — MRSA PCR SCREENING: MRSA BY PCR: NEGATIVE

## 2017-06-09 MED ORDER — CYCLOBENZAPRINE HCL 5 MG PO TABS
5.0000 mg | ORAL_TABLET | Freq: Three times a day (TID) | ORAL | Status: DC
  Filled 2017-06-09: qty 1

## 2017-06-09 MED ORDER — SODIUM BICARBONATE 650 MG PO TABS
650.0000 mg | ORAL_TABLET | Freq: Two times a day (BID) | ORAL | Status: DC
  Administered 2017-06-09 – 2017-06-12 (×6): 650 mg via ORAL
  Filled 2017-06-09 (×8): qty 1

## 2017-06-09 MED ORDER — HYDROCODONE-ACETAMINOPHEN 7.5-325 MG PO TABS: 1.0000 | ORAL_TABLET | Freq: Two times a day (BID) | ORAL | Status: DC | PRN

## 2017-06-09 MED ORDER — PANTOPRAZOLE SODIUM 40 MG PO TBEC
40.0000 mg | DELAYED_RELEASE_TABLET | Freq: Two times a day (BID) | ORAL | Status: DC
  Administered 2017-06-09 – 2017-06-14 (×9): 40 mg via ORAL
  Filled 2017-06-09 (×11): qty 1

## 2017-06-09 MED ORDER — GERHARDT'S BUTT CREAM
1.0000 "application " | TOPICAL_CREAM | Freq: Four times a day (QID) | CUTANEOUS | Status: DC | PRN
  Filled 2017-06-09 (×2): qty 1

## 2017-06-09 MED ORDER — BOOST / RESOURCE BREEZE PO LIQD
1.0000 | Freq: Three times a day (TID) | ORAL | Status: DC
  Administered 2017-06-09 – 2017-06-17 (×19): 1 via ORAL

## 2017-06-09 MED ORDER — ALBUTEROL SULFATE HFA 108 (90 BASE) MCG/ACT IN AERS: 2.0000 | INHALATION_SPRAY | Freq: Four times a day (QID) | RESPIRATORY_TRACT | Status: DC | PRN

## 2017-06-09 MED ORDER — SACCHAROMYCES BOULARDII 250 MG PO CAPS
250.0000 mg | ORAL_CAPSULE | Freq: Every day | ORAL | Status: DC
  Administered 2017-06-09 – 2017-06-12 (×4): 250 mg via ORAL
  Filled 2017-06-09 (×5): qty 1

## 2017-06-09 MED ORDER — METHOCARBAMOL 500 MG PO TABS
750.0000 mg | ORAL_TABLET | Freq: Three times a day (TID) | ORAL | Status: DC
  Administered 2017-06-09 (×2): 750 mg via ORAL
  Filled 2017-06-09 (×8): qty 2

## 2017-06-09 MED ORDER — CALCIUM CARBONATE-VITAMIN D 500-200 MG-UNIT PO TABS
1.0000 | ORAL_TABLET | Freq: Every day | ORAL | Status: DC
  Filled 2017-06-09 (×5): qty 1

## 2017-06-09 MED ORDER — COLESTIPOL HCL 1 G PO TABS
2.0000 g | ORAL_TABLET | Freq: Two times a day (BID) | ORAL | Status: DC
  Filled 2017-06-09 (×13): qty 2

## 2017-06-09 MED ORDER — DIPHENOXYLATE-ATROPINE 2.5-0.025 MG PO TABS
1.0000 | ORAL_TABLET | Freq: Four times a day (QID) | ORAL | Status: DC | PRN
  Administered 2017-06-09 – 2017-06-14 (×6): 1 via ORAL
  Filled 2017-06-09 (×7): qty 1

## 2017-06-09 MED ORDER — CYANOCOBALAMIN 1000 MCG/ML IJ SOLN
1000.0000 ug | INTRAMUSCULAR | Status: DC
  Filled 2017-06-09: qty 1

## 2017-06-09 MED ORDER — ALBUTEROL SULFATE (2.5 MG/3ML) 0.083% IN NEBU: 2.5000 mg | INHALATION_SOLUTION | Freq: Four times a day (QID) | RESPIRATORY_TRACT | Status: DC | PRN

## 2017-06-09 MED ORDER — ADULT MULTIVITAMIN W/MINERALS CH
1.0000 | ORAL_TABLET | Freq: Every day | ORAL | Status: DC
  Filled 2017-06-09 (×5): qty 1

## 2017-06-09 NOTE — Consult Note (Signed)
Cross cover [facility] Reason for Consult: Solid food dysphagia. Referring Physician: THP  Danielle [name] is an 71 y.o. female.  HPI: [name] is a 71 year old black female with multiple medical issues like psoriasis chronic kidney disease GERD and a history of rectovaginal fistula,  long-standing history of Crohn's disease on infliximab infusions, under the care of Dr. Laural Golden, admitted to the hospital yesterday with nausea vomiting and solid food dysphagia. On reviewing her recent records from Dr. Claiborne Rigg she's had been esophageal dilation of a Schatzki's ring with [facility] dilators per the report mentions inability to advance the dilators because of a tortuous esophagus. She claims she felt well for a few days after the esophageal dilatation but his symptoms recurred shortly thereafter. On the recent endoscopy done on 03/12/2017 she was also noted to have a large hiatal hernia.  Past Medical History:  Diagnosis Date  . Crohn disease ([place])   . Hiatal hernia 12/07/2015  . Psoriasis    Past Surgical History:  Procedure Laterality Date  . ANAL STENOSIS  ~ 2010  . APPENDECTOMY  1980's  . BOWEL RESECTION  2009  . CHOLECYSTECTOMY  1990's  . COLONOSCOPY  2013  . COLONOSCOPY WITH PROPOFOL N/A 03/12/2017   Procedure: COLONOSCOPY WITH PROPOFOL;  Surgeon: Rogene Houston, MD;  [facility];  Service: Endoscopy;  Laterality: N/A;  2:05-moved up to 1025 per Lelon Frohlich  . COLOSTOMY  2009   DEMASO  . COLOSTOMY TAKEDOWN  2009   "only had it for about 1 month" (08/22/2013)  . ESOPHAGEAL DILATION N/A 03/12/2017   Procedure: ESOPHAGEAL DILATION;  Surgeon: Rogene Houston, MD;  [facility];  Service: Endoscopy;  Laterality: N/A;  . ESOPHAGOGASTRODUODENOSCOPY  11/11/2011   Procedure: ESOPHAGOGASTRODUODENOSCOPY (EGD);  Surgeon: Missy Sabins, MD;  Location: [facility] ENDOSCOPY;  Service: Endoscopy;  Laterality: N/A;  . ESOPHAGOGASTRODUODENOSCOPY  05/18/2012   Procedure: ESOPHAGOGASTRODUODENOSCOPY  (EGD);  Surgeon: Rogene Houston, MD;  [facility];  Service: Endoscopy;  Laterality: N/A;  200  . ESOPHAGOGASTRODUODENOSCOPY (EGD) WITH PROPOFOL N/A 03/12/2017   Procedure: ESOPHAGOGASTRODUODENOSCOPY (EGD) WITH PROPOFOL;  Surgeon: Rogene Houston, MD;  [facility];  Service: Endoscopy;  Laterality: N/A;  . FLEXIBLE SIGMOIDOSCOPY  11/11/2011   Procedure: FLEXIBLE SIGMOIDOSCOPY;  Surgeon: Missy Sabins, MD;  Location: [place];  Service: Endoscopy;  Laterality: N/A;  . goiter  1999  . INCISION AND DRAINAGE PERIRECTAL ABSCESS N/A 04/21/2017   Procedure: IRRIGATION AND DEBRIDEMENT PERIRECTAL ABSCESS;  Surgeon: Coralie Keens, MD;  Location: [place];  Service: General;  Laterality: N/A;  . LEFT HEART CATHETERIZATION WITH CORONARY ANGIOGRAM N/A 07/09/2014   Procedure: LEFT HEART CATHETERIZATION WITH CORONARY ANGIOGRAM;  Surgeon: Peter M Martinique, MD;  Location: [facility] CATH LAB;  Service: Cardiovascular;  Laterality: N/A;  . PROCTOSCOPY N/A 01/31/2015   Procedure: RIGID PROCTOSCOPY;  Surgeon: Leighton Ruff, MD;  Location: [place];  Service: General;  Laterality: N/A;  . RECTAL EXAM UNDER ANESTHESIA N/A 01/31/2015   Procedure: RECTAL EXAM UNDER ANESTHESIA WITH PLACEMENT OF A SETON;  Surgeon: Leighton Ruff, MD;  Location: [facility] OR;  Service: General;  Laterality: N/A;   Family History  Problem Relation Age of Onset  . Diabetes Mother   . Heart disease Father   . Healthy Sister   . Hypertension Brother   . Colon cancer Brother        died with colon cancer  . Crohn's disease Paternal Aunt    Social History:  reports  that she quit smoking about 20 years ago. Her smoking use included Cigarettes. She has a 0.48 pack-year smoking history. She has never used smokeless tobacco. She reports that she does not drink alcohol or use drugs.  Allergies:  Allergies  Allergen Reactions  . Zithromax [Azithromycin] Swelling and Other (See Comments)    Reaction:  Mouth swelling and blisters   . Aspirin  Other (See Comments)    Reaction:  Stomach cramping   . Ibuprofen Other (See Comments)    Pt states that she prefers not to take this medication.   . Sulfa Antibiotics Nausea And Vomiting   Medications: I have reviewed the patient's current medications.  Results for orders placed or performed during the hospital encounter of 06/08/17 (from the past 48 hour(s))  Urinalysis, Routine w reflex microscopic     Status: Abnormal   Collection Time: 06/08/17  3:39 PM  Result Value Ref Range   Color, Urine YELLOW YELLOW   APPearance CLOUDY (A) CLEAR   Specific Gravity, Urine 1.010 1.005 - 1.030   pH 6.0 5.0 - 8.0   Glucose, UA NEGATIVE NEGATIVE mg/dL   Hgb urine dipstick MODERATE (A) NEGATIVE   Bilirubin Urine NEGATIVE NEGATIVE   Ketones, ur NEGATIVE NEGATIVE mg/dL   Protein, ur 30 (A) NEGATIVE mg/dL   Nitrite NEGATIVE NEGATIVE   Leukocytes, UA LARGE (A) NEGATIVE   RBC / HPF TOO NUMEROUS TO COUNT 0 - 5 RBC/hpf   WBC, UA TOO NUMEROUS TO COUNT 0 - 5 WBC/hpf   Bacteria, UA MANY (A) NONE SEEN   Squamous Epithelial / LPF 6-30 (A) NONE SEEN   WBC Clumps PRESENT    Mucous PRESENT   Lipase, blood     Status: Abnormal   Collection Time: 06/08/17  3:43 PM  Result Value Ref Range   Lipase 52 (H) 11 - 51 U/L  Comprehensive metabolic panel     Status: Abnormal   Collection Time: 06/08/17  3:43 PM  Result Value Ref Range   Sodium 141 135 - 145 mmol/L   Potassium 2.8 (L) 3.5 - 5.1 mmol/L   Chloride 118 (H) 101 - 111 mmol/L   CO2 13 (L) 22 - 32 mmol/L   Glucose, Bld 98 65 - 99 mg/dL   BUN 24 (H) 6 - 20 mg/dL   Creatinine, Ser 1.60 (H) 0.44 - 1.00 mg/dL   Calcium 7.6 (L) 8.9 - 10.3 mg/dL   Total Protein 8.0 6.5 - 8.1 g/dL   Albumin 3.6 3.5 - 5.0 g/dL   AST 30 15 - 41 U/L   ALT 21 14 - 54 U/L   Alkaline Phosphatase 128 (H) 38 - 126 U/L   Total Bilirubin 0.6 0.3 - 1.2 mg/dL   GFR calc non Af Amer 32 (L) >60 mL/min   GFR calc Af Amer 37 (L) >60 mL/min    Comment: (NOTE) The eGFR has been  calculated using the CKD EPI equation. This calculation has not been validated in all clinical situations. eGFR's persistently <60 mL/min signify possible Chronic Kidney Disease.    Anion gap 10 5 - 15  CBC     Status: Abnormal   Collection Time: 06/08/17  3:43 PM  Result Value Ref Range   WBC 12.4 (H) 4.0 - 10.5 K/uL   RBC 4.37 3.87 - 5.11 MIL/uL   Hemoglobin 11.8 (L) 12.0 - 15.0 g/dL   HCT 35.4 (L) 36.0 - 46.0 %   MCV 81.0 78.0 - 100.0 fL   MCH 27.0 26.0 -  34.0 pg   MCHC 33.3 30.0 - 36.0 g/dL   RDW 18.2 (H) 11.5 - 15.5 %   Platelets 195 150 - 400 K/uL  Magnesium     Status: Abnormal   Collection Time: 06/08/17  3:43 PM  Result Value Ref Range   Magnesium 1.3 (L) 1.7 - 2.4 mg/dL   Dg Abd Acute W/chest  Result Date: 06/08/2017 CLINICAL DATA:  Nausea, vomiting and diarrhea EXAM: DG ABDOMEN ACUTE W/ 1V CHEST COMPARISON:  Chest CT from 05/08/2017 FINDINGS: Previously noted herniation of stomach and portions of small bowel are again identified projecting over the top normal size cardiac silhouette. The lungs are clear. Old right-sided rib fractures involving the right fourth through eighth ribs are noted. The patient is status post cholecystectomy. The bowel gas pattern is unremarkable. Several sutures are also seen in the right mid abdomen. No free air or bowel obstruction is identified. IMPRESSION: 1. Large hiatal hernia again noted projecting over the cardiac and mediastinal contours. 2. No bowel obstruction. 3. No acute cardiopulmonary disease. Electronically Signed   By: Ashley Royalty M.D.   On: 06/08/2017 17:27   Review of Systems  Constitutional: Positive for malaise/fatigue.  HENT: Negative.   Eyes: Negative.   Respiratory: Negative.   Cardiovascular: Negative.   Gastrointestinal: Positive for nausea and vomiting. Negative for abdominal pain, blood in stool, constipation, diarrhea and melena.  Musculoskeletal: Positive for joint pain.  Skin: Positive for rash.  Neurological:  Positive for weakness.  Endo/Heme/Allergies: Negative.    Blood pressure (!) 143/109, pulse (!) 103, temperature (!) 97.4 F (36.3 C), temperature source Oral, resp. rate (!) 24, height _0  (1.626 m), weight 51.7 kg (114 lb), SpO2 96 %. Physical Exam  Constitutional: She is oriented to person, place, and time. She appears lethargic. She appears cachectic. She is cooperative. She has a sickly appearance. She appears distressed.  HENT:  Head: Normocephalic and atraumatic.  Eyes: Conjunctivae and EOM are normal. Pupils are equal, round, and reactive to light.  Neck: Normal range of motion. Neck supple.  Cardiovascular: Normal rate and regular rhythm.   Respiratory: Effort normal and breath sounds normal.  GI: Soft. Bowel sounds are normal. There is no hepatosplenomegaly. There is no CVA tenderness.  Neurological: She is oriented to person, place, and time. She appears lethargic.  Skin: Skin is warm and dry.  Psychiatric: She has a normal mood and affect. Her behavior is normal. Judgment and thought content normal.   Assessment/Plan: 1) Solid food dysphagia/GERD/Schatzki's ring/Large hiatal hernia-on PPI's-will plan an EGD tomorrow. 2) Crohn's disease on Infliximab. 3) Psoriasis. Delisa Finck 06/09/2017, 11:29 AM

## 2017-06-09 NOTE — Progress Notes (Addendum)
PROGRESS NOTE    Danielle [name]  FHL:[id number] DOB: January 14, 1946 DOA: 06/08/2017 PCP: Iona Beard, MD  Outpatient Specialists:   [facility]    Brief Narrative:   47 ? Psoriasis Complicated Crohn's disease with perirectal abscesses  Status post I&D 04/21/2017 Dr. Ninfa Linden general surgery  In the past has refused diverting colostomy--supposed to see Dr. Philip Aspen of general surgery after last  admission 05/06/2017--05/14/2017 and did not make office appointment  Re-admit 7/3 with poor po intake Relates has ptylasim but in addition cannot keeop "any solid down" since 6 days PTa Only drinking water and milk shakes  Has had eso dilatation  ~ 2 mo ago-Dr. Laren Everts has had dilationj ~ 1 yeear prior in 2017  States food has been "getting stuck" in the throat No cp No fever   Assessment & Plan:   Principal Problem:   Nausea and vomiting Active Problems:   Leukocytosis   Hypomagnesemia   Acute renal failure superimposed on stage 3 chronic kidney disease ([place])   Crohn disease ([place])   Hypokalemia   Acute lower UTI   Dysphagia ? 2/2 stricture  Noted Schatzki rings 03/2017  Large hiatl hernias  Attempted to get DG eso--not available as today 4th july  Await result-if +, will probably need GI input re: esophageal dilatation  Will forward to primary GI Dr. Laural Golden as courtesy   Appreciate Dr. Collene Mares GI input  Volume depletion secondary to or by mouth intake Hypokalemia, hypomagnesemia Chr Metabolic acidosis from vol depletion [chr diarrhea] on admit-cannot get labs-await PICC for repletion and further lab draw Leukocytosis-? 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  Laural Golden  Currently getting Remicade infusions as OP  Continue Lomotil q 4 prn, Coletipol 2 tab bid  Back spasm/lumbago  Dc fentanyl and oxycodone  Trial flexeril 5 mg tid spasm scheduled-->robaxin as not effective  scd Talked to sister on phone and updated 7/4 Appreciate GI input Needs resolution of swallowing issue sprior to d/c home   Consultants:   HI Dr. Collene Mares 7/4  Procedures:   None yet  Antimicrobials:    rocephin 7/3    Subjective:  Awake and alert gives a 1-2 week h/o inability to swallow solids-tol lmilkshakes, no fever no chills, no emesis but feels a "sticking" feeling in the throat  Objective: Vitals:   06/08/17 1946 06/08/17 1948 06/08/17 2115 06/09/17 0635  BP: (!) 84/63 97/65 (!) 86/50 (!) 143/109  Pulse: 90 91 91 (!) 103  Resp: 16  17 (!) 24  Temp:   98.1 F (36.7 C) (!) 97.4 F (36.3 C)  TempSrc:   Oral Oral  SpO2: 100% 100% 100% 96%  Weight:      Height:        Intake/Output Summary (Last 24 hours) at 06/09/17 0809 Last data filed at 06/09/17 0200  Gross per 24 hour  Intake             1685 ml  Output                0 ml  Net             1685 ml   Filed Weights   06/08/17 1536  Weight: 51.7 kg (  114 lb)    Examination:  eomi ncat Wasted, no ict no pallor, moves 4 limbs equally-poor dentition cta b abd soft nt nd no rebound Rectum has loose green stool-not watery-no decubitus, no noted overt fistula\ Neuro intact     Data Reviewed: I have personally reviewed following labs and imaging studies  CBC:  Recent Labs Lab 06/08/17 1543  WBC 12.4*  HGB 11.8*  HCT 35.4*  MCV 81.0  PLT 951   Basic Metabolic Panel:  Recent Labs Lab 06/08/17 1543  NA 141  K 2.8*  CL 118*  CO2 13*  GLUCOSE 98  BUN 24*  CREATININE 1.60*  CALCIUM 7.6*  MG 1.3*   GFR: Estimated Creatinine Clearance: 27.1 mL/min (A) (by C-G formula based on SCr of 1.6 mg/dL (H)). Liver Function Tests:  Recent Labs Lab 06/08/17 1543  AST 30  ALT 21   ALKPHOS 128*  BILITOT 0.6  PROT 8.0  ALBUMIN 3.6    Recent Labs Lab 06/08/17 1543  LIPASE 52*   No results for input(s): AMMONIA in the last 168 hours. Coagulation Profile: No results for input(s): INR, PROTIME in the last 168 hours. Cardiac Enzymes: No results for input(s): CKTOTAL, CKMB, CKMBINDEX, TROPONINI in the last 168 hours. BNP (last 3 results) No results for input(s): PROBNP in the last 8760 hours. HbA1C: No results for input(s): HGBA1C in the last 72 hours. CBG: No results for input(s): GLUCAP in the last 168 hours. Lipid Profile: No results for input(s): CHOL, HDL, LDLCALC, TRIG, CHOLHDL, LDLDIRECT in the last 72 hours. Thyroid Function Tests: No results for input(s): TSH, T4TOTAL, FREET4, T3FREE, THYROIDAB in the last 72 hours. Anemia Panel: No results for input(s): VITAMINB12, FOLATE, FERRITIN, TIBC, IRON, RETICCTPCT in the last 72 hours. Urine analysis:    Component Value Date/Time   COLORURINE YELLOW 06/08/2017 1539   APPEARANCEUR CLOUDY (A) 06/08/2017 1539   LABSPEC 1.010 06/08/2017 1539   PHURINE 6.0 06/08/2017 1539   GLUCOSEU NEGATIVE 06/08/2017 1539   HGBUR MODERATE (A) 06/08/2017 1539   BILIRUBINUR NEGATIVE 06/08/2017 1539   KETONESUR NEGATIVE 06/08/2017 1539   PROTEINUR 30 (A) 06/08/2017 1539   UROBILINOGEN 0.2 07/29/2015 2231   NITRITE NEGATIVE 06/08/2017 1539   LEUKOCYTESUR LARGE (A) 06/08/2017 1539   Sepsis Labs: @LABRCNTIP (procalcitonin:4,lacticidven:4)  )No results found for this or any previous visit (from the past 240 hour(s)).       Radiology Studies: Dg Abd Acute W/chest  Result Date: 06/08/2017 CLINICAL DATA:  Nausea, vomiting and diarrhea EXAM: DG ABDOMEN ACUTE W/ 1V CHEST COMPARISON:  Chest CT from 05/08/2017 FINDINGS: Previously noted herniation of stomach and portions of small bowel are again identified projecting over the top normal size cardiac silhouette. The lungs are clear. Old right-sided rib fractures involving the  right fourth through eighth ribs are noted. The patient is status post cholecystectomy. The bowel gas pattern is unremarkable. Several sutures are also seen in the right mid abdomen. No free air or bowel obstruction is identified. IMPRESSION: 1. Large hiatal hernia again noted projecting over the cardiac and mediastinal contours. 2. No bowel obstruction. 3. No acute cardiopulmonary disease. Electronically Signed   By: Ashley Royalty M.D.   On: 06/08/2017 17:27        Scheduled Meds: . calcium-vitamin D  1 tablet Oral Daily  . colestipol  2 g Oral BID  . cyanocobalamin  1,000 mcg Intramuscular Q30 days  . enoxaparin (LOVENOX) injection  30 mg Subcutaneous Q24H  . megestrol  400 mg Oral Daily  .  multivitamin with minerals  1 tablet Oral Daily  . pantoprazole  40 mg Oral BID  . saccharomyces boulardii  250 mg Oral Daily  . sodium bicarbonate  650 mg Oral BID  . sodium chloride flush  3 mL Intravenous Q12H   Continuous Infusions: . sodium chloride 100 mL/hr at 06/08/17 2309  . cefTRIAXone (ROCEPHIN)  IV 1 g (06/09/17 0630)     LOS: 1 day    Time spent: Gould, MD [facility]   If 7PM-7AM, please contact night-coverage [email or web address] Password [email or web address] 06/09/2017, 8:09 AM

## 2017-06-10 ENCOUNTER — Encounter (HOSPITAL_COMMUNITY)
Admission: EM | Disposition: A | Discharge status: Home Health | Payer: Self-pay | Source: Home / Self Care | Attending: Family Medicine

## 2017-06-10 ENCOUNTER — Inpatient Hospital Stay (HOSPITAL_COMMUNITY): Payer: Medicare HMO | Admitting: Anesthesiology

## 2017-06-10 ENCOUNTER — Encounter (HOSPITAL_COMMUNITY): Payer: Self-pay | Admitting: *Deleted

## 2017-06-10 ENCOUNTER — Inpatient Hospital Stay (HOSPITAL_COMMUNITY): Payer: Medicare HMO

## 2017-06-10 DIAGNOSIS — K449 Diaphragmatic hernia without obstruction or gangrene: Principal | ICD-10-CM

## 2017-06-10 DIAGNOSIS — E43 Unspecified severe protein-calorie malnutrition: Secondary | ICD-10-CM | POA: Insufficient documentation

## 2017-06-10 LAB — BASIC METABOLIC PANEL
Anion gap: 6 (ref 5–15)
BUN: 10 mg/dL (ref 6–20)
CALCIUM: 7.3 mg/dL — AB (ref 8.9–10.3)
CO2: 7 mmol/L — AB (ref 22–32)
CREATININE: 1.18 mg/dL — AB (ref 0.44–1.00)
Chloride: 128 mmol/L — ABNORMAL HIGH (ref 101–111)
GFR, EST AFRICAN AMERICAN: 53 mL/min — AB (ref >60)
GFR, EST NON AFRICAN AMERICAN: 46 mL/min — AB (ref >60)
Glucose, Bld: 116 mg/dL — ABNORMAL HIGH (ref 65–99)
Potassium: 2.8 mmol/L — ABNORMAL LOW (ref 3.5–5.1)
Sodium: 141 mmol/L (ref 135–145)

## 2017-06-10 LAB — COMPREHENSIVE METABOLIC PANEL
ALK PHOS: 90 U/L (ref 38–126)
ALT: 13 U/L — ABNORMAL LOW (ref 14–54)
ANION GAP: 12 (ref 5–15)
AST: 17 U/L (ref 15–41)
Albumin: 2.6 g/dL — ABNORMAL LOW (ref 3.5–5.0)
BILIRUBIN TOTAL: 0.4 mg/dL (ref 0.3–1.2)
BUN: 13 mg/dL (ref 6–20)
CALCIUM: 7.8 mg/dL — AB (ref 8.9–10.3)
CO2: 8 mmol/L — ABNORMAL LOW (ref 22–32)
Chloride: 123 mmol/L — ABNORMAL HIGH (ref 101–111)
Creatinine, Ser: 1.3 mg/dL — ABNORMAL HIGH (ref 0.44–1.00)
GFR calc Af Amer: 47 mL/min — ABNORMAL LOW (ref >60)
GFR, EST NON AFRICAN AMERICAN: 41 mL/min — AB (ref >60)
Glucose, Bld: 80 mg/dL (ref 65–99)
POTASSIUM: 2.3 mmol/L — AB (ref 3.5–5.1)
Sodium: 143 mmol/L (ref 135–145)
TOTAL PROTEIN: 6 g/dL — AB (ref 6.5–8.1)

## 2017-06-10 LAB — MAGNESIUM: MAGNESIUM: 1.6 mg/dL — AB (ref 1.7–2.4)

## 2017-06-10 SURGERY — EGD (ESOPHAGOGASTRODUODENOSCOPY)
Anesthesia: Monitor Anesthesia Care

## 2017-06-10 SURGERY — CANCELLED PROCEDURE
Anesthesia: Monitor Anesthesia Care

## 2017-06-10 MED ORDER — PROPOFOL 10 MG/ML IV BOLUS
INTRAVENOUS | Status: AC
  Filled 2017-06-10: qty 20

## 2017-06-10 MED ORDER — SODIUM CHLORIDE 0.9 % IV SOLN
INTRAVENOUS | Status: DC
  Administered 2017-06-10 – 2017-06-11 (×3): via INTRAVENOUS
  Filled 2017-06-10 (×7): qty 1000

## 2017-06-10 MED ORDER — OXYCODONE-ACETAMINOPHEN 5-325 MG PO TABS
1.0000 | ORAL_TABLET | ORAL | Status: DC | PRN
  Filled 2017-06-10: qty 1

## 2017-06-10 MED ORDER — SODIUM CHLORIDE 0.9 % IV SOLN: INTRAVENOUS | Status: DC

## 2017-06-10 MED ORDER — SODIUM CHLORIDE 0.9% FLUSH
10.0000 mL | INTRAVENOUS | Status: DC | PRN
  Administered 2017-06-15 – 2017-06-16 (×3): 10 mL
  Filled 2017-06-10 (×3): qty 40

## 2017-06-10 MED ORDER — POTASSIUM CHLORIDE 2 MEQ/ML IV SOLN: INTRAVENOUS | Status: DC

## 2017-06-10 MED ORDER — MORPHINE SULFATE (PF) 4 MG/ML IV SOLN
1.0000 mg | INTRAVENOUS | Status: DC | PRN
  Administered 2017-06-10 – 2017-06-12 (×9): 1 mg via INTRAVENOUS
  Filled 2017-06-10 (×10): qty 1

## 2017-06-10 MED ORDER — BOOST / RESOURCE BREEZE PO LIQD: 1.0000 | Freq: Three times a day (TID) | ORAL | Status: DC

## 2017-06-10 SURGICAL SUPPLY — 15 items

## 2017-06-10 NOTE — Anesthesia Preprocedure Evaluation (Signed)
Anesthesia Evaluation  Patient identified by MRN, date of birth, ID band Patient awake    Reviewed: Allergy & Precautions, NPO status , Patient's Chart, lab work & pertinent test results  Airway Mallampati: III  TM Distance: >3 FB Neck ROM: Full    Dental no notable dental hx. (+) Dental Advidsory Given, Lower Dentures, Upper Dentures   Pulmonary former smoker,    Pulmonary exam normal breath sounds clear to auscultation       Cardiovascular Exercise Tolerance: Good negative cardio ROS Normal cardiovascular exam Rhythm:Regular Rate:Normal     Neuro/Psych negative psych ROS   GI/Hepatic Neg liver ROS, hiatal hernia, GERD  Medicated and Controlled,Crohns's disease with fistula   Endo/Other  negative endocrine ROS  Renal/GU CRFRenal diseaseChronic kidney disease  negative genitourinary   Musculoskeletal negative musculoskeletal ROS (+)   Abdominal (+) + scaphoid   Peds negative pediatric ROS (+)  Hematology negative hematology ROS (+)   Anesthesia Other Findings   Reproductive/Obstetrics negative OB ROS                             Anesthesia Physical  Anesthesia Plan  ASA: II  Anesthesia Plan: MAC   Post-op Pain Management:    Induction: Intravenous  PONV Risk Score and Plan: 2 and Ondansetron and Dexamethasone  Airway Management Planned: Natural Airway and Simple Face Mask  Additional Equipment:   Intra-op Plan:   Post-operative Plan:   Informed Consent: I have reviewed the patients History and Physical, chart, labs and discussed the procedure including the risks, benefits and alternatives for the proposed anesthesia with the patient or authorized representative who has indicated his/her understanding and acceptance.   Dental Advisory Given  Plan Discussed with: CRNA and Surgeon  Anesthesia Plan Comments:         Anesthesia Quick Evaluation

## 2017-06-10 NOTE — Progress Notes (Signed)
Peripherally Inserted Central Catheter/Midline Placement  The IV Nurse has discussed with the patient and/or persons authorized to consent for the patient, the purpose of this procedure and the potential benefits and risks involved with this procedure.  The benefits include less needle sticks, lab draws from the catheter, and the patient may be discharged home with the catheter. Risks include, but not limited to, infection, bleeding, blood clot (thrombus formation), and puncture of an artery; nerve damage and irregular heartbeat and possibility to perform a PICC exchange if needed/ordered by physician.  Alternatives to this procedure were also discussed.  Bard Power PICC patient education guide, fact sheet on infection prevention and patient information card has been provided to patient /or left at bedside.    PICC/Midline Placement Documentation  PICC Single Lumen 29/51/88 PICC Right Basilic 34 cm 2 cm (Active)  Indication for Insertion or Continuance of Line Poor Vasculature-patient has had multiple peripheral attempts or PIVs lasting less than 24 hours 06/10/2017  2:00 PM  Exposed Catheter (cm) 2 cm 06/10/2017  2:00 PM  Dressing Change Due 06/17/17 06/10/2017  2:00 PM       Jule Economy Horton 06/10/2017, 2:16 PM

## 2017-06-10 NOTE — Progress Notes (Addendum)
CRITICAL VALUE ALERT  Critical Value:  K 2.3  Date & Time Notified:  06/10/17 0901  Provider Notified: Dr. Verlon Au  Orders Received/Actions taken: MD placed orders

## 2017-06-10 NOTE — Progress Notes (Signed)
Initial Nutrition Assessment  DOCUMENTATION CODES:   Severe malnutrition in context of chronic illness  INTERVENTION:   Boost Breeze po TID, each supplement provides 250 kcal and 9 grams of protein  MVI  Pt at high refeeding risk   NUTRITION DIAGNOSIS:   Malnutrition (severe) related to chronic dysphagia as evidenced by severe depletion of body fat, severe depletion of muscle mass, 22 percent weight loss in 3 months  GOAL:   Patient will meet greater than or equal to 90% of their needs  MONITOR:   PO intake, Supplement acceptance, Labs, Weight trends  REASON FOR ASSESSMENT:   Malnutrition Screening Tool    ASSESSMENT:   71 y.o. female with medical history significant of psoriasis, Crohn's disease (followed by GI, Dr. Laural Golden on remicaide infusion), hiatal hernia, GERD, CKD stage III, and H/O rectovaginal fistula; who presents with complaints of vomiting. Patient was just recently admitted into the hospital 5/31-6/8 sepsis secondary to urinary tract infection and possible Crohn's flare. She had just had a recent perirectal abscess I&D by Dr. Rush Farmer on 5/16. Since being home patient reports having poor oral appetite.   Patient has a h/o Schatzki's ring for which she has undergone esophageal dilation, most recently on 03/12/17; patient believes that she felt better for a few days following this procedure. States that she came to the ED because over the past 2 weeks she has had persistent dysphagia and inability to tolerate any solid food. She reports nausea and vomiting.  Met with pt in room today. Pt reports poor appetite and oral intake for 3 weeks pta. Pt currently eating 0% of her clear liquid diet but drinking some Boost Breeze. Per chart, pt has lost 32lbs(22%) in 3 months; this is severe. Pt was scheduled to have EGD today but procedure was cancelled r/t hypokalemia. Per MD note, possible EGD 7/6. Monitor and supplement potassium as needed per MD discretion.   Medications  reviewed and include: Ca Vit D, colestipol, Vit B-12, lovenox, protonix, MVI, florastor, ceftriaxone  Labs reviewed: K 2.3(L), Cl 123(H), creat 1.3(H), Ca 7.8(L) adj. 8.92 wnl, Mg 1.6(L) Wbc- 12.4(H)  Nutrition-Focused physical exam completed. Findings are severe fat depletion in arms and chest, moderate to severe muscle depletions over entire body, and no edema.   Diet Order:  Diet full liquid Room service appropriate? Yes; Fluid consistency: Thin  Skin:  Reviewed, no issues  Last BM:  7/4  Height:   Ht Readings from Last 1 Encounters:  06/10/17 5' 4"  (1.626 m)    Weight:   Wt Readings from Last 1 Encounters:  06/10/17 114 lb (51.7 kg)    Ideal Body Weight:  54.5 kg  BMI:  Body mass index is 19.57 kg/m.  Estimated Nutritional Needs:   Kcal:  1550-1750kcal/day   Protein:  78-88g/day   Fluid:  >1.5L/day   EDUCATION NEEDS:   No education needs identified at this time  Koleen Distance MS, RD, [place] Pager #[phone] After Hours Pager: [phone]

## 2017-06-10 NOTE — Progress Notes (Signed)
[facility] Progress Note    Since last GI note: Had planned for repeat EGD this AM however her K is too low for safe sedation and so we're rescheduling it for tomorrow.  She has dysphagia to solids, tells me this has been for 3 weeks; oddly doesn't think she had dysphagia previously (even before the EGD 2 months ago with dilation of Schatzki's ring).  She has a huge HH on imaging.  Objective: Vital signs in last 24 hours: Temp:  [97.6 F (36.4 C)-98 F (36.7 C)] 97.6 F (36.4 C) (07/05 0835) Pulse Rate:  [75-92] 75 (07/05 0835) Resp:  [14-18] 14 (07/05 0835) BP: (88-115)/(52-75) 88/59 (07/05 0835) SpO2:  [93 %-100 %] 95 % (07/05 0835) Weight:  [114 lb (51.7 kg)] 114 lb (51.7 kg) (07/05 0835) Last BM Date: 06/09/17 General: alert and oriented times 3 Heart: regular rate and rythm Abdomen: soft, non-tender, non-distended, normal bowel sounds   Lab Results:  Recent Labs  06/08/17 1543  WBC 12.4*  HGB 11.8*  PLT 195  MCV 81.0    Recent Labs  06/08/17 1543 06/10/17 0810  NA 141 143  K 2.8* 2.3*  CL 118* 123*  CO2 13* 8*  GLUCOSE 98 80  BUN 24* 13  CREATININE 1.60* 1.30*  CALCIUM 7.6* 7.8*    Recent Labs  06/08/17 1543 06/10/17 0810  PROT 8.0 6.0*  ALBUMIN 3.6 2.6*  AST 30 17  ALT 21 13*  ALKPHOS 128* 90  BILITOT 0.6 0.4   Studies/Results: Dg Abd Acute W/chest  Result Date: 06/08/2017 CLINICAL DATA:  Nausea, vomiting and diarrhea EXAM: DG ABDOMEN ACUTE W/ 1V CHEST COMPARISON:  Chest CT from 05/08/2017 FINDINGS: Previously noted herniation of stomach and portions of small bowel are again identified projecting over the top normal size cardiac silhouette. The lungs are clear. Old right-sided rib fractures involving the right fourth through eighth ribs are noted. The patient is status post cholecystectomy. The bowel gas pattern is unremarkable. Several sutures are also seen in the right mid abdomen. No free air or bowel obstruction is identified.  IMPRESSION: 1. Large hiatal hernia again noted projecting over the cardiac and mediastinal contours. 2. No bowel obstruction. 3. No acute cardiopulmonary disease. Electronically Signed   By: Ashley Royalty M.D.   On: 06/08/2017 17:27     Medications: Scheduled Meds: . [MAR Hold] calcium-vitamin D  1 tablet Oral Daily  . [MAR Hold] colestipol  2 g Oral BID  . [MAR Hold] cyanocobalamin  1,000 mcg Intramuscular Q30 days  . [MAR Hold] enoxaparin (LOVENOX) injection  30 mg Subcutaneous Q24H  . [MAR Hold] feeding supplement  1 Container Oral TID BM  . [MAR Hold] methocarbamol  750 mg Oral TID  . [MAR Hold] multivitamin with minerals  1 tablet Oral Daily  . [MAR Hold] pantoprazole  40 mg Oral BID  . [MAR Hold] saccharomyces boulardii  250 mg Oral Daily  . [MAR Hold] sodium bicarbonate  650 mg Oral BID  . [MAR Hold] sodium chloride flush  3 mL Intravenous Q12H   Continuous Infusions: . sodium chloride    . [MAR Hold] cefTRIAXone (ROCEPHIN)  IV Stopped (06/09/17 2056)  . sodium chloride 0.9 % 1,000 mL with potassium chloride 80 mEq infusion     PRN Meds:.[MAR Hold] acetaminophen **OR** [MAR Hold] acetaminophen, [MAR Hold] albuterol, [MAR Hold] diphenoxylate-atropine, [MAR Hold] Gerhardt's butt cream, [MAR Hold] ondansetron **OR** [MAR Hold] ondansetron (ZOFRAN) IV    Assessment/Plan: 71 y.o. female with dysphagia.  She  tells me her dysphagia is 52 weeks old, oddly doesn't recall have swallowing issues in past but Dr. Olevia Perches notes are quite clear about her swallowing trouble and that it improved after EGD 2 1/2 months ago with balloon dilation.   She has a very large hiatal hernia containing most of her stomach and some of her small bowel. I think that is the cause of her swallowing difficulty and note that Dr. Laural Golden described a 'very tortuous' esophagus 2 1/2 months ago.    Originally we were planning on repeat EGD but I don't think that is really the best next test here.  Her K was too low  to safely sedate her anyway.  I'm cancelling the procedure and will instead order a barium esophagram to best characterize her hernia and we will also ask general surgery to see her to consider [facility] repair.     Milus Banister, MD  06/10/2017, 9:09 AM [facility] Pager [phone]

## 2017-06-10 NOTE — Consult Note (Signed)
Danielle [name] Surgery Consult Note  Danielle [name] [birth year]  [id number].    Requesting MD: Owens Loffler Chief Complaint/Reason for Consult: Hiatal hernia  HPI:  Danielle [name] is a 7YI female PMH Crohn's disease on Remicade, who presented to [facility] 06/08/17 worsening nausea, vomiting, and dysphagia. Patient has a h/o Schatzki's ring for which she has undergone esophageal dilation, most recently on 03/12/17; patient believes that she felt better for a few days following this procedure. States that she came to the ED because over the past 2 weeks she has had persistent dysphagia and inability to tolerate any solid food. She reports nausea and vomiting. She feels weak and tired due to poor PO intake, and has been unable to do all of her normal daily activities. She is having normal bowel function. States that she has some mild upper abdominal pain, but overall pain is not severe. She takes pantoprazole at home and has not had any recent reflux symptoms. Patient was scheduled for a repeat EGD today, but due to a critically low potassium this was cancelled. General surgery asked to consult due to large hiatal hernia, and to consider if this could be the cause of her progressive dysphagia.  -PMH significant for Crohn's disease on Remicade, GERD, rectovaginal fistula -Abdominal surgical history: appendectomy, cholecystectomy, 2-3 small bowel resections by Dr. Tamala Julian ~2005, colostomy followed by colostomy takedown 2009 by Dr. Anthony Sar -Nonsmoker -Lives at home with sister. Uses cane PRN for ambulation.  ROS: Review of Systems  Constitutional: Positive for malaise/fatigue and weight loss.  HENT: Negative.   Eyes: Negative.   Respiratory: Negative.   Cardiovascular: Negative.   Gastrointestinal: Positive for abdominal pain, nausea and vomiting. Negative for blood in stool, constipation, diarrhea, heartburn and melena.  Genitourinary: Negative.   Musculoskeletal: Negative.   Skin: Negative.    Neurological: Positive for weakness.  All systems reviewed and otherwise negative except for as above  Family History  Problem Relation Age of Onset  . Diabetes Mother   . Heart disease Father   . Healthy Sister   . Hypertension Brother   . Colon cancer Brother        died with colon cancer  . Crohn's disease Paternal Aunt     Past Medical History:  Diagnosis Date  . Crohn disease ([place])   . Hiatal hernia 12/07/2015  . Psoriasis     Past Surgical History:  Procedure Laterality Date  . ANAL STENOSIS  ~ 2010  . APPENDECTOMY  1980's  . BOWEL RESECTION  2009  . CHOLECYSTECTOMY  1990's  . COLONOSCOPY  2013  . COLONOSCOPY WITH PROPOFOL N/A 03/12/2017   Procedure: COLONOSCOPY WITH PROPOFOL;  Surgeon: Rogene Houston, MD;  [facility];  Service: Endoscopy;  Laterality: N/A;  2:05-moved up to 1025 per Lelon Frohlich  . COLOSTOMY  2009   DEMASO  . COLOSTOMY TAKEDOWN  2009   "only had it for about 1 month" (08/22/2013)  . ESOPHAGEAL DILATION N/A 03/12/2017   Procedure: ESOPHAGEAL DILATION;  Surgeon: Rogene Houston, MD;  [facility]: [facility];  Service: Endoscopy;  Laterality: N/A;  . ESOPHAGOGASTRODUODENOSCOPY  11/11/2011   Procedure: ESOPHAGOGASTRODUODENOSCOPY (EGD);  Surgeon: Missy Sabins, MD;  Location: [facility] ENDOSCOPY;  Service: Endoscopy;  Laterality: N/A;  . ESOPHAGOGASTRODUODENOSCOPY  05/18/2012   Procedure: ESOPHAGOGASTRODUODENOSCOPY (EGD);  Surgeon: Rogene Houston, MD;  [facility]: [facility];  Service: Endoscopy;  Laterality: N/A;  200  . ESOPHAGOGASTRODUODENOSCOPY (EGD) WITH PROPOFOL N/A 03/12/2017   Procedure: ESOPHAGOGASTRODUODENOSCOPY (EGD)  WITH PROPOFOL;  Surgeon: Rogene Houston, MD;  [facility]: [facility];  Service: Endoscopy;  Laterality: N/A;  . FLEXIBLE SIGMOIDOSCOPY  11/11/2011   Procedure: FLEXIBLE SIGMOIDOSCOPY;  Surgeon: Missy Sabins, MD;  Location: [place];  Service: Endoscopy;  Laterality: N/A;  . goiter  1999  . INCISION AND DRAINAGE PERIRECTAL ABSCESS  N/A 04/21/2017   Procedure: IRRIGATION AND DEBRIDEMENT PERIRECTAL ABSCESS;  Surgeon: Coralie Keens, MD;  Location: [place];  Service: General;  Laterality: N/A;  . LEFT HEART CATHETERIZATION WITH CORONARY ANGIOGRAM N/A 07/09/2014   Procedure: LEFT HEART CATHETERIZATION WITH CORONARY ANGIOGRAM;  Surgeon: Peter M Martinique, MD;  Location: [facility] CATH LAB;  Service: Cardiovascular;  Laterality: N/A;  . PROCTOSCOPY N/A 01/31/2015   Procedure: RIGID PROCTOSCOPY;  Surgeon: Leighton Ruff, MD;  Location: [place];  Service: General;  Laterality: N/A;  . RECTAL EXAM UNDER ANESTHESIA N/A 01/31/2015   Procedure: RECTAL EXAM UNDER ANESTHESIA WITH PLACEMENT OF A SETON;  Surgeon: Leighton Ruff, MD;  Location: [place];  Service: General;  Laterality: N/A;    Social History:  reports that she quit smoking about 20 years ago. Her smoking use included Cigarettes. She has a 0.48 pack-year smoking history. She has never used smokeless tobacco. She reports that she does not drink alcohol or use drugs.  Allergies:  Allergies  Allergen Reactions  . Zithromax [Azithromycin] Swelling and Other (See Comments)    Reaction:  Mouth swelling and blisters   . Aspirin Other (See Comments)    Reaction:  Stomach cramping   . Ibuprofen Other (See Comments)    Pt states that she prefers not to take this medication.   . Sulfa Antibiotics Nausea And Vomiting    Medications Prior to Admission  Medication Sig Dispense Refill  . acetaminophen (TYLENOL) 325 MG tablet Take 2 tablets (650 mg total) by mouth every 6 (six) hours as needed for mild pain, fever or headache. 30 tablet 0  . albuterol (PROVENTIL HFA;VENTOLIN HFA) 108 (90 Base) MCG/ACT inhaler Inhale 2 puffs into the lungs every 6 (six) hours as needed for wheezing. 1 Inhaler 2  . Calcium Carbonate-Vitamin D (CALCIUM 600+D) 600-400 MG-UNIT tablet Take 1 tablet by mouth daily.    . colestipol (COLESTID) 1 g tablet Take 2 tablets (2 g total) by mouth 2 (two) times daily. 60 tablet 0  .  cyanocobalamin (,VITAMIN B-12,) 1000 MCG/ML injection Inject 1,000 mcg into the muscle every 30 (thirty) days.    [name] dicyclomine (BENTYL) 10 MG/5ML syrup Take 20 mg by mouth 3 (three) times daily as needed (for GI upset).    . diphenoxylate-atropine (LOMOTIL) 2.5-0.025 MG tablet TAKE 1 TABLET BY MOUTH 4 TIMES A DAY AS NEEDED FOR DIARRHEA/LOOSE STOOLS 90 tablet 0  . furosemide (LASIX) 40 MG tablet Take 1 tablet (40 mg total) by mouth daily as needed for edema. 30 tablet 1  . HYDROcodone-acetaminophen (NORCO) 7.5-325 MG tablet Take 1 tablet by mouth every 12 (twelve) hours as needed for moderate pain.    [name] Hydrocortisone (GERHARDT'S BUTT CREAM) CREA Apply 1 application topically 4 (four) times daily as needed for irritation.    . inFLIXimab (REMICADE) 100 MG injection Inject 100 mg into the vein every 8 (eight) weeks.    . Multiple Vitamin (MULTIVITAMIN WITH MINERALS) TABS Take 1 tablet by mouth daily.    [name] nystatin (NYSTATIN) powder Apply 1 g topically 2 (two) times daily as needed (for itching/irritation).    . potassium chloride SA (K-DUR,KLOR-CON) 20 MEQ tablet Take  20 mEq by mouth 3 (three) times daily.    . promethazine (PHENERGAN) 25 MG tablet TAKE 1 TABLET (25 MG TOTAL) BY MOUTH 2 (TWO) TIMES DAILY AS NEEDED FOR NAUSEA OR VOMITING. 30 tablet 0  . saccharomyces boulardii (FLORASTOR) 250 MG capsule Take 250 mg by mouth daily.    . sodium bicarbonate 650 MG tablet Take 1 tablet (650 mg total) by mouth 2 (two) times daily. 10 tablet 0  . traMADol (ULTRAM) 50 MG tablet Take 1 tablet (50 mg total) by mouth every 6 (six) hours as needed for moderate pain. 15 tablet 0  . Vitamin D, Ergocalciferol, (DRISDOL) 50000 units CAPS capsule Take 1 capsule (50,000 Units total) by mouth every 7 (seven) days. (Patient taking differently: Take 50,000 Units by mouth every Monday. ) 24 capsule 0  . ferrous sulfate 325 (65 FE) MG tablet Take 1 tablet (325 mg total) by mouth 3 (three) times daily with meals. (Patient not  taking: Reported on 06/08/2017) 90 tablet 3  . fluconazole (DIFLUCAN) 150 MG tablet TAKE 1 TABLET BY MOUTH EVERY DAY (Patient not taking: Reported on 06/08/2017) 10 tablet 1  . loperamide (IMODIUM) 2 MG capsule Take 2 capsules (4 mg total) by mouth every 6 (six) hours. (Patient not taking: Reported on 06/08/2017) 30 capsule 0  . magic mouthwash w/lidocaine SOLN Take 5 mLs by mouth 4 (four) times daily. (Patient not taking: Reported on 06/08/2017) 30 mL 0  . pantoprazole (PROTONIX) 40 MG tablet TAKE 1 TABLET (40 MG TOTAL) BY MOUTH 2 (TWO) TIMES DAILY BEFORE A MEAL. 180 tablet 3  . predniSONE (DELTASONE) 20 MG tablet Take 2 tablets (40 mg total) by mouth daily with breakfast. (Patient not taking: Reported on 06/08/2017) 30 tablet 0  . zinc oxide 20 % ointment Apply 1 application topically 2 (two) times daily. (Patient not taking: Reported on 06/08/2017) 56.7 g 0    Prior to Admission medications   Medication Sig Start Date End Date Taking? Authorizing Provider  acetaminophen (TYLENOL) 325 MG tablet Take 2 tablets (650 mg total) by mouth every 6 (six) hours as needed for mild pain, fever or headache. 05/14/17  Yes Sheikh, Omair Latif, DO  albuterol (PROVENTIL HFA;VENTOLIN HFA) 108 (90 Base) MCG/ACT inhaler Inhale 2 puffs into the lungs every 6 (six) hours as needed for wheezing. 12/09/15  Yes Rama, Venetia Maxon, MD  Calcium Carbonate-Vitamin D (CALCIUM 600+D) 600-400 MG-UNIT tablet Take 1 tablet by mouth daily.   Yes [provider]  colestipol (COLESTID) 1 g tablet Take 2 tablets (2 g total) by mouth 2 (two) times daily. 05/14/17  Yes Sheikh, Omair Latif, DO  cyanocobalamin (,VITAMIN B-12,) 1000 MCG/ML injection Inject 1,000 mcg into the muscle every 30 (thirty) days.   Yes [provider]  dicyclomine (BENTYL) 10 MG/5ML syrup Take 20 mg by mouth 3 (three) times daily as needed (for GI upset).   Yes [provider]  diphenoxylate-atropine (LOMOTIL) 2.5-0.025 MG tablet TAKE 1 TABLET BY MOUTH 4  TIMES A DAY AS NEEDED FOR DIARRHEA/LOOSE STOOLS 06/02/17  Yes Setzer, Terri L, NP  furosemide (LASIX) 40 MG tablet Take 1 tablet (40 mg total) by mouth daily as needed for edema. 06/30/16  Yes Rehman, Mechele Dawley, MD  HYDROcodone-acetaminophen (NORCO) 7.5-325 MG tablet Take 1 tablet by mouth every 12 (twelve) hours as needed for moderate pain.   Yes [provider]  Hydrocortisone (GERHARDT'S BUTT CREAM) CREA Apply 1 application topically 4 (four) times daily as needed for irritation.  Yes [provider]  inFLIXimab (REMICADE) 100 MG injection Inject 100 mg into the vein every 8 (eight) weeks.   Yes [provider]  Multiple Vitamin (MULTIVITAMIN WITH MINERALS) TABS Take 1 tablet by mouth daily.   Yes [provider]  nystatin (NYSTATIN) powder Apply 1 g topically 2 (two) times daily as needed (for itching/irritation).   Yes [provider]  potassium chloride SA (K-DUR,KLOR-CON) 20 MEQ tablet Take 20 mEq by mouth 3 (three) times daily.   Yes [provider]  promethazine (PHENERGAN) 25 MG tablet TAKE 1 TABLET (25 MG TOTAL) BY MOUTH 2 (TWO) TIMES DAILY AS NEEDED FOR NAUSEA OR VOMITING. 05/17/17  Yes Setzer, Terri L, NP  saccharomyces boulardii (FLORASTOR) 250 MG capsule Take 250 mg by mouth daily.   Yes [provider]  sodium bicarbonate 650 MG tablet Take 1 tablet (650 mg total) by mouth 2 (two) times daily. 05/14/17  Yes Sheikh, Omair Latif, DO  traMADol (ULTRAM) 50 MG tablet Take 1 tablet (50 mg total) by mouth every 6 (six) hours as needed for moderate pain. 04/29/17  Yes Earnstine Regal, PA-C  Vitamin D, Ergocalciferol, (DRISDOL) 50000 units CAPS capsule Take 1 capsule (50,000 Units total) by mouth every 7 (seven) days. Patient taking differently: Take 50,000 Units by mouth every Monday.  07/19/16  Yes Rehman, Mechele Dawley, MD  ferrous sulfate 325 (65 FE) MG tablet Take 1 tablet (325 mg total) by mouth 3 (three) times daily with meals. Patient  not taking: Reported on 06/08/2017 05/14/17   Raiford Noble Latif, DO  fluconazole (DIFLUCAN) 150 MG tablet TAKE 1 TABLET BY MOUTH EVERY DAY Patient not taking: Reported on 06/08/2017 05/25/17   Rogene Houston, MD  loperamide (IMODIUM) 2 MG capsule Take 2 capsules (4 mg total) by mouth every 6 (six) hours. Patient not taking: Reported on 06/08/2017 05/14/17   Raiford Noble Latif, DO  magic mouthwash w/lidocaine SOLN Take 5 mLs by mouth 4 (four) times daily. Patient not taking: Reported on 06/08/2017 05/14/17   Raiford Noble Latif, DO  pantoprazole (PROTONIX) 40 MG tablet TAKE 1 TABLET (40 MG TOTAL) BY MOUTH 2 (TWO) TIMES DAILY BEFORE A MEAL. 05/25/16   Setzer, Rona Ravens, NP  predniSONE (DELTASONE) 20 MG tablet Take 2 tablets (40 mg total) by mouth daily with breakfast. Patient not taking: Reported on 06/08/2017 05/15/17   Raiford Noble Latif, DO  zinc oxide 20 % ointment Apply 1 application topically 2 (two) times daily. Patient not taking: Reported on 06/08/2017 05/14/17   Raiford Noble Latif, DO    Blood pressure (!) 88/59, pulse 75, temperature 97.6 F (36.4 C), temperature source Oral, resp. rate 14, height _0  (1.626 m), weight 114 lb (51.7 kg), SpO2 95 %. Physical Exam: General: pleasant, chronically ill appearing AA female who is laying in bed in NAD HEENT: head is normocephalic, atraumatic.  Sclera are noninjected.  Pupils equal and round.  Ears and nose without any masses or lesions.  Mouth is pink and moist. Dentition fair Heart: regular, rate, and rhythm.  No obvious murmurs, gallops, or rubs noted.  Palpable pedal pulses bilaterally Lungs: CTAB, no wheezes, rhonchi, or rales noted.  Respiratory effort nonlabored Abd: well healed midline incisions and previous colostomy site rlq, soft, ND, +BS, no masses or organomegaly. No abdominal tenderness MS: all 4 extremities are symmetrical with no cyanosis, clubbing, or edema. Skin: warm and dry with no masses, lesions, or rashes Psych: A&Ox3 with an  appropriate affect. Neuro: cranial nerves  grossly intact, extremity CSM intact bilaterally, normal speech  Results for orders placed or performed during the [name] encounter of 06/08/17 (from the past 48 hour(s))  Urinalysis, Routine w reflex microscopic     Status: Abnormal   Collection Time: 06/08/17  3:39 PM  Result Value Ref Range   Color, Urine YELLOW YELLOW   APPearance CLOUDY (A) CLEAR   Specific Gravity, Urine 1.010 1.005 - 1.030   pH 6.0 5.0 - 8.0   Glucose, UA NEGATIVE NEGATIVE mg/dL   Hgb urine dipstick MODERATE (A) NEGATIVE   Bilirubin Urine NEGATIVE NEGATIVE   Ketones, ur NEGATIVE NEGATIVE mg/dL   Protein, ur 30 (A) NEGATIVE mg/dL   Nitrite NEGATIVE NEGATIVE   Leukocytes, UA LARGE (A) NEGATIVE   RBC / HPF TOO NUMEROUS TO COUNT 0 - 5 RBC/hpf   WBC, UA TOO NUMEROUS TO COUNT 0 - 5 WBC/hpf   Bacteria, UA MANY (A) NONE SEEN   Squamous Epithelial / LPF 6-30 (A) NONE SEEN   WBC Clumps PRESENT    Mucous PRESENT   Urine Culture     Status: Abnormal (Preliminary result)   Collection Time: 06/08/17  3:39 PM  Result Value Ref Range   Specimen Description URINE, RANDOM    Special Requests NONE    Culture (A)     >=100,000 COLONIES/mL KLEBSIELLA PNEUMONIAE SUSCEPTIBILITIES TO FOLLOW Performed at [facility], [address] [place], [place] [address]    Report Status PENDING   Lipase, blood     Status: Abnormal   Collection Time: 06/08/17  3:43 PM  Result Value Ref Range   Lipase 52 (H) 11 - 51 U/L  Comprehensive metabolic panel     Status: Abnormal   Collection Time: 06/08/17  3:43 PM  Result Value Ref Range   Sodium 141 135 - 145 mmol/L   Potassium 2.8 (L) 3.5 - 5.1 mmol/L   Chloride 118 (H) 101 - 111 mmol/L   CO2 13 (L) 22 - 32 mmol/L   Glucose, Bld 98 65 - 99 mg/dL   BUN 24 (H) 6 - 20 mg/dL   Creatinine, Ser 1.60 (H) 0.44 - 1.00 mg/dL   Calcium 7.6 (L) 8.9 - 10.3 mg/dL   Total Protein 8.0 6.5 - 8.1 g/dL   Albumin 3.6 3.5 - 5.0 g/dL   AST 30 15 - 41 U/L    ALT 21 14 - 54 U/L   Alkaline Phosphatase 128 (H) 38 - 126 U/L   Total Bilirubin 0.6 0.3 - 1.2 mg/dL   GFR calc non Af Amer 32 (L) >60 mL/min   GFR calc Af Amer 37 (L) >60 mL/min    Comment: (NOTE) The eGFR has been calculated using the CKD EPI equation. This calculation has not been validated in all clinical situations. eGFR's persistently <60 mL/min signify possible Chronic Kidney Disease.    Anion gap 10 5 - 15  CBC     Status: Abnormal   Collection Time: 06/08/17  3:43 PM  Result Value Ref Range   WBC 12.4 (H) 4.0 - 10.5 K/uL   RBC 4.37 3.87 - 5.11 MIL/uL   Hemoglobin 11.8 (L) 12.0 - 15.0 g/dL   HCT 35.4 (L) 36.0 - 46.0 %   MCV 81.0 78.0 - 100.0 fL   MCH 27.0 26.0 - 34.0 pg   MCHC 33.3 30.0 - 36.0 g/dL   RDW 18.2 (H) 11.5 - 15.5 %   Platelets 195 150 - 400 K/uL  Magnesium     Status:  Abnormal   Collection Time: 06/08/17  3:43 PM  Result Value Ref Range   Magnesium 1.3 (L) 1.7 - 2.4 mg/dL  MRSA PCR Screening     Status: None   Collection Time: 06/09/17  9:15 AM  Result Value Ref Range   MRSA by PCR NEGATIVE NEGATIVE    Comment:        The GeneXpert MRSA Assay (FDA approved for NASAL specimens only), is one component of a comprehensive MRSA colonization surveillance program. It is not intended to diagnose MRSA infection nor to guide or monitor treatment for MRSA infections.   Comprehensive metabolic panel     Status: Abnormal   Collection Time: 06/10/17  8:10 AM  Result Value Ref Range   Sodium 143 135 - 145 mmol/L   Potassium 2.3 (LL) 3.5 - 5.1 mmol/L    Comment: DELTA CHECK NOTED CRITICAL RESULT CALLED TO, READ BACK BY AND VERIFIED WITH: S.LOVE RN [id number] A.QUIZON    Chloride 123 (H) 101 - 111 mmol/L   CO2 8 (L) 22 - 32 mmol/L   Glucose, Bld 80 65 - 99 mg/dL   BUN 13 6 - 20 mg/dL   Creatinine, Ser 1.30 (H) 0.44 - 1.00 mg/dL   Calcium 7.8 (L) 8.9 - 10.3 mg/dL   Total Protein 6.0 (L) 6.5 - 8.1 g/dL   Albumin 2.6 (L) 3.5 - 5.0 g/dL   AST 17 15 - 41  U/L   ALT 13 (L) 14 - 54 U/L   Alkaline Phosphatase 90 38 - 126 U/L   Total Bilirubin 0.4 0.3 - 1.2 mg/dL   GFR calc non Af Amer 41 (L) >60 mL/min   GFR calc Af Amer 47 (L) >60 mL/min    Comment: (NOTE) The eGFR has been calculated using the CKD EPI equation. This calculation has not been validated in all clinical situations. eGFR's persistently <60 mL/min signify possible Chronic Kidney Disease.    Anion gap 12 5 - 15  Magnesium     Status: Abnormal   Collection Time: 06/10/17  8:10 AM  Result Value Ref Range   Magnesium 1.6 (L) 1.7 - 2.4 mg/dL   Dg Esophagus  Result Date: 06/10/2017 CLINICAL DATA:  Dysphagia EXAM: ESOPHOGRAM/BARIUM SWALLOW TECHNIQUE: Single contrast examination was performed using  thin barium. FLUOROSCOPY TIME:  Fluoroscopy Time:  1 minutes 6 seconds Radiation Exposure Index (if provided by the fluoroscopic device): 2.9 mGy Number of Acquired Spot Images: 0 COMPARISON:  CT chest 05/08/2017 FINDINGS: Limited study as the patient would only take small sips, min refused to drink more prior to completing the study. There is a large hiatal hernia. The esophagus deviates to the right around the large hiatal hernia. There are no fixed esophageal strictures or visible masses. IMPRESSION: Limited study as above. Deviation of the mid to distal esophagus around a large hiatal hernia. No fixed strictures. Electronically Signed   By: Rolm Baptise M.D.   On: 06/10/2017 11:01   Dg Abd Acute W/chest  Result Date: 06/08/2017 CLINICAL DATA:  Nausea, vomiting and diarrhea EXAM: DG ABDOMEN ACUTE W/ 1V CHEST COMPARISON:  Chest CT from 05/08/2017 FINDINGS: Previously noted herniation of stomach and portions of small bowel are again identified projecting over the top normal size cardiac silhouette. The lungs are clear. Old right-sided rib fractures involving the right fourth through eighth ribs are noted. The patient is status post cholecystectomy. The bowel gas pattern is unremarkable. Several  sutures are also seen in the right mid abdomen. No free  air or bowel obstruction is identified. IMPRESSION: 1. Large hiatal hernia again noted projecting over the cardiac and mediastinal contours. 2. No bowel obstruction. 3. No acute cardiopulmonary disease. Electronically Signed   By: Ashley Royalty M.D.   On: 06/08/2017 17:27   Anti-infectives    Start     Dose/Rate Route Frequency Ordered Stop   06/08/17 2000  cefTRIAXone (ROCEPHIN) 1 g in dextrose 5 % 50 mL IVPB     1 g 100 mL/hr over 30 Minutes Intravenous Every 24 hours 06/08/17 1954          Assessment/Plan Crohn's disease - on Remicade, followed by Dr. Laural Golden GERD - controlled with pantoprazole Rectovaginal fistula AKI Hypokalemia, Hypomagnesemia   Dysphagia - h/o history of Schatzki's ring - EGD with balloon dilation 03/15/2017: moderate Schatzki ring, large hiatal hernia. Per report patient did improve some after this procedure. - scheduled for repeat EGD today but this was cancelled due to critically low potassium Large hiatal hernia - abdominal surgical history: appendectomy, cholecystectomy, 2-3 small bowel resections by Dr. Tamala Julian ~2005, colostomy followed by colostomy takedown 2009 by Dr. Anthony Sar - at least 3 weeks of severe dysphagia and inability to tolerate any solid food - DG esophagus today showed deviation of the mid to distal esophagus around large hiatal hernia, no fixed strictures  ID - rocephin 7/3>> VTE - SCDs, lovenox FEN - full liquids  Plan - Patient has had severe dysphagia, n/v for at least 3 weeks and been unable to tolerate any solid food. She has a h/o Schatzki's ring as well as a large hiatal hernia, both of which could worsen her dysphagia. Will discuss with MD.  Jerrye Beavers, [facility] Surgery 06/10/2017, 11:13 AM Pager: [phone] Consults: [phone] Mon-Fri 7:00 am-4:30 pm Sat-Sun 7:00 am-11:30 am

## 2017-06-10 NOTE — Progress Notes (Signed)
Patient in pre-procedure for EGD.  Critical potassium received.  Primary MD notified, orders placed.  Dr. Jillyn Hidden and Dr. Ardis Hughs notified.  Procedure cancelled for today.  Bedside RN Ify notified, patient transported via bed to [id number].  Vista Lawman, RN

## 2017-06-10 NOTE — Progress Notes (Addendum)
PROGRESS NOTE    Danielle [name]  XTG:[id number] DOB: 04-14-1946 DOA: 06/08/2017 PCP: Iona Beard, MD  Outpatient Specialists:   [facility]    Brief Narrative:   9 ? Psoriasis Complicated Crohn's disease with perirectal abscesses  Status post I&D 04/21/2017 Dr. Ninfa Linden general surgery  In the past has refused diverting colostomy--supposed to see Dr. 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 Laren Everts has had dilationj ~ 1 year prior in 2017  On admission tells me that she has significant dysphagia and cannot swallow solid foods Only apparently has been able to drink milkshakes and liquids and keep these down with success She also has abdominal pain in the epigastrium as well as low back pain  Because of her persisting symptoms we tried to order on July 4 a he suffered gram which was not possible and subsequently consulted GI who felt that an endoscopy was necessary to rule out stricture Esophageogram 7/5 eventually done confirmed large hiatal hernia General surgery consulted for input regarding possible surgery for the same  Assessment & Plan:   Principal Problem:   Nausea and vomiting Active Problems:   Leukocytosis   Hypomagnesemia   Acute renal failure superimposed on stage 3 chronic kidney disease ([place])   Crohn disease ([place])   Hypokalemia   Acute lower UTI   Protein-calorie malnutrition, severe   Dysphagia   secondary to a stricture +Large hiatl hernias  GI consulted, esophageal gram 06/09/2017 = large hiatal hernia  General surgery consulted 06/10/17-planning on eventual diaphragmatico abdominopexy [?]  Volume depletion secondary to or by mouth  intake Hypokalemia, hypomagnesemia Chr Metabolic acidosis from vol depletion [chr diarrhea] on admit- PICC 7/5 for repletion and further lab draw  Severe hypokalemia-of 2.3 06/10/2017-replace potassium with saline D5 80 mEq at 100 cc/HR   Should increase potassium to 3.1-awaiting labs   Possible UTI  Urine culture 100,000 K pneumonia  Continue Rocephin and trend CBC in a.m.  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 Laural Golden  Currently getting Remicade infusions as OP  Continue Lomotil q 4 prn, Coletipol 2 tab bid  Back spasm/lumbago  Dc fentanyl and oxycodone  Trial flexeril 5 mg tid spasm scheduled-->robaxin as not effective  Have added when necessary low-dose morphine IV 7/5  Severe malnutrition in the context of chronic disease  High risk for refeeding as per dietitian  Add on phosphorus and magnesium 7.6.18  scd No family present Appreciate GI input, Gen surg input Unclear dispo  Consultants:    Dr. Collene Mares 7/4  Gen surg 7/5 dr toth  Procedures:   None yet  Antimicrobials:    rocephin 7/3    Subjective:  Pleasant Out of room today for mutiple procedures Having some abd pain, no vomit No fever, no chills Still has chr diarr, but no fever   Objective: Vitals:   06/10/17 0450 06/10/17 0537 06/10/17 0835 06/10/17 1313  BP: (!) 88/52 (!) 92/54 (!) 88/59 101/61  Pulse: 76  75 84  Resp: 18  14 16   Temp: 27.0 F (36.4 C)  97.6 F (36.4 C) 97.6 F (36.4 C)  TempSrc: Oral  Oral Oral  SpO2: 100%  95% 100%  Weight:   51.7 kg (114 lb)   Height:   5\' 4"  (1.626 m)     Intake/Output Summary (Last 24 hours) at 06/10/17 1638 Last data filed at 06/10/17 1600  Gross per 24 hour  Intake          1922.33 ml  Output                0 ml  Net          1922.33 ml   Filed Weights    06/08/17 1536 06/10/17 0835  Weight: 51.7 kg (114 lb) 51.7 kg (114 lb)    Examination:  Alert pleasant seem confrtable Not anxious cta b, no added sound, no sputum No rales abd soft, slight tender in epigastrium s1 s2 no m/r/g,  No le edema Psych euthymic  Data Reviewed: I have personally reviewed following labs and imaging studies  CBC:  Recent Labs Lab 06/08/17 1543  WBC 12.4*  HGB 11.8*  HCT 35.4*  MCV 81.0  PLT 785   Basic Metabolic Panel:  Recent Labs Lab 06/08/17 1543 06/10/17 0810  NA 141 143  K 2.8* 2.3*  CL 118* 123*  CO2 13* 8*  GLUCOSE 98 80  BUN 24* 13  CREATININE 1.60* 1.30*  CALCIUM 7.6* 7.8*  MG 1.3* 1.6*   GFR: Estimated Creatinine Clearance: 33.3 mL/min (A) (by C-G formula based on SCr of 1.3 mg/dL (H)). Liver Function Tests:  Recent Labs Lab 06/08/17 1543 06/10/17 0810  AST 30 17  ALT 21 13*  ALKPHOS 128* 90  BILITOT 0.6 0.4  PROT 8.0 6.0*  ALBUMIN 3.6 2.6*    Recent Labs Lab 06/08/17 1543  LIPASE 52*   No results for input(s): AMMONIA in the last 168 hours. Coagulation Profile: No results for input(s): INR, PROTIME in the last 168 hours. Cardiac Enzymes: No results for input(s): CKTOTAL, CKMB, CKMBINDEX, TROPONINI in the last 168 hours. BNP (last 3 results) No results for input(s): PROBNP in the last 8760 hours. HbA1C: No results for input(s): HGBA1C in the last 72 hours. CBG: No results for input(s): GLUCAP in the last 168 hours. Lipid Profile: No results for input(s): CHOL, HDL, LDLCALC, TRIG, CHOLHDL, LDLDIRECT in the last 72 hours. Thyroid Function Tests: No results for input(s): TSH, T4TOTAL, FREET4, T3FREE, THYROIDAB in the last 72 hours. Anemia Panel: No results for input(s): VITAMINB12, FOLATE, FERRITIN, TIBC, IRON, RETICCTPCT in the last 72 hours. Urine analysis:    Component Value Date/Time   COLORURINE YELLOW 06/08/2017 1539   APPEARANCEUR CLOUDY (A) 06/08/2017 1539   LABSPEC 1.010 06/08/2017 1539    PHURINE 6.0 06/08/2017 1539   GLUCOSEU NEGATIVE 06/08/2017 1539   HGBUR MODERATE (A) 06/08/2017 1539   BILIRUBINUR NEGATIVE 06/08/2017 1539   KETONESUR NEGATIVE 06/08/2017 1539   PROTEINUR 30 (A) 06/08/2017 1539   UROBILINOGEN 0.2 07/29/2015 2231   NITRITE NEGATIVE 06/08/2017 1539   LEUKOCYTESUR LARGE (A) 06/08/2017 1539   Sepsis Labs: @LABRCNTIP (procalcitonin:4,lacticidven:4)  ) Recent Results (from the past 240 hour(s))  Urine Culture     Status: Abnormal (Preliminary result)   Collection Time: 06/08/17  3:39 PM  Result Value Ref Range Status   Specimen Description URINE, RANDOM  Final   Special Requests NONE  Final   Culture (A)  Final    >=100,000 COLONIES/mL KLEBSIELLA PNEUMONIAE SUSCEPTIBILITIES TO FOLLOW Performed at [facility], [address] [place], [place] [address]    Report Status PENDING  Incomplete  MRSA PCR Screening     Status: None   Collection Time: 06/09/17  9:15 AM  Result Value Ref Range Status   MRSA by PCR NEGATIVE NEGATIVE Final    Comment:        The GeneXpert MRSA Assay (FDA approved for NASAL specimens only), is one component of a comprehensive MRSA colonization surveillance program. It is not intended to diagnose MRSA infection nor to guide or monitor treatment for MRSA infections.          Radiology Studies: Dg Esophagus  Result Date: 06/10/2017 CLINICAL DATA:  Dysphagia EXAM: ESOPHOGRAM/BARIUM SWALLOW TECHNIQUE: Single contrast examination was performed using  thin barium. FLUOROSCOPY TIME:  Fluoroscopy Time:  1 minutes 6 seconds Radiation Exposure Index (if provided by the fluoroscopic device): 2.9 mGy Number of Acquired Spot Images: 0 COMPARISON:  CT chest 05/08/2017 FINDINGS: Limited study as the patient would only take small sips, min refused to drink more prior to completing the study. There is a large hiatal hernia. The esophagus deviates to the right around the large hiatal hernia. There are no fixed esophageal strictures  or visible masses. IMPRESSION: Limited study as above. Deviation of the mid to distal esophagus around a large hiatal hernia. No fixed strictures. Electronically Signed   By: Rolm Baptise M.D.   On: 06/10/2017 11:01   Dg Abd Acute W/chest  Result Date: 06/08/2017 CLINICAL DATA:  Nausea, vomiting and diarrhea EXAM: DG ABDOMEN ACUTE W/ 1V CHEST COMPARISON:  Chest CT from 05/08/2017 FINDINGS: Previously noted herniation of stomach and portions of small bowel are again identified projecting over the top normal size cardiac silhouette. The lungs are clear. Old right-sided rib fractures involving the right fourth through eighth ribs are noted. The patient is status post cholecystectomy. The bowel gas pattern is unremarkable. Several sutures are also seen in the right mid abdomen. No free air or bowel obstruction is identified. IMPRESSION: 1. Large hiatal hernia again noted projecting over the cardiac and mediastinal contours. 2. No bowel obstruction. 3. No acute cardiopulmonary disease. Electronically Signed   By: Ashley Royalty M.D.   On: 06/08/2017 17:27        Scheduled Meds: . calcium-vitamin D  1 tablet Oral Daily  . colestipol  2 g Oral BID  . cyanocobalamin  1,000 mcg Intramuscular Q30 days  . enoxaparin (LOVENOX) injection  30 mg Subcutaneous Q24H  . feeding supplement  1 Container Oral TID BM  . methocarbamol  750 mg Oral TID  . multivitamin with minerals  1 tablet Oral Daily  . pantoprazole  40 mg Oral BID  . saccharomyces boulardii  250 mg Oral Daily  . sodium bicarbonate  650 mg Oral BID  . sodium chloride flush  3 mL Intravenous Q12H   Continuous Infusions: . cefTRIAXone (ROCEPHIN)  IV Stopped (06/09/17 2056)  . sodium chloride 0.9 % 1,000 mL with potassium chloride 80 mEq infusion 100 mL/hr at 06/10/17 1155     LOS: 2 days    Time spent: [place], MD [facility] ([facility]   If 7PM-7AM, please contact night-coverage [email or web address] Password  [facility] 06/10/2017, 4:38 PM

## 2017-06-11 LAB — URINE CULTURE

## 2017-06-11 LAB — CBC WITH DIFFERENTIAL/PLATELET
BASOS ABS: 0 10*3/uL (ref 0.0–0.1)
Basophils Relative: 0 %
Eosinophils Absolute: 0.2 10*3/uL (ref 0.0–0.7)
Eosinophils Relative: 2 %
HEMATOCRIT: 25.8 % — AB (ref 36.0–46.0)
Hemoglobin: 8.5 g/dL — ABNORMAL LOW (ref 12.0–15.0)
LYMPHS ABS: 5.6 10*3/uL — AB (ref 0.7–4.0)
LYMPHS PCT: 61 %
MCH: 27 pg (ref 26.0–34.0)
MCHC: 32.9 g/dL (ref 30.0–36.0)
MCV: 81.9 fL (ref 78.0–100.0)
MONO ABS: 0.9 10*3/uL (ref 0.1–1.0)
Monocytes Relative: 10 %
NEUTROS ABS: 2.5 10*3/uL (ref 1.7–7.7)
Neutrophils Relative %: 27 %
Platelets: 137 10*3/uL — ABNORMAL LOW (ref 150–400)
RBC: 3.15 MIL/uL — AB (ref 3.87–5.11)
RDW: 18.7 % — ABNORMAL HIGH (ref 11.5–15.5)
WBC: 9.1 10*3/uL (ref 4.0–10.5)

## 2017-06-11 LAB — BASIC METABOLIC PANEL
Anion gap: 7 (ref 5–15)
BUN: 6 mg/dL (ref 6–20)
CO2: 7 mmol/L — ABNORMAL LOW (ref 22–32)
Calcium: 7.4 mg/dL — ABNORMAL LOW (ref 8.9–10.3)
Chloride: 128 mmol/L — ABNORMAL HIGH (ref 101–111)
Creatinine, Ser: 0.95 mg/dL (ref 0.44–1.00)
GFR calc non Af Amer: 60 mL/min — ABNORMAL LOW (ref >60)
Glucose, Bld: 80 mg/dL (ref 65–99)
POTASSIUM: 4.2 mmol/L (ref 3.5–5.1)
SODIUM: 142 mmol/L (ref 135–145)

## 2017-06-11 LAB — COMPREHENSIVE METABOLIC PANEL
ALBUMIN: 2.4 g/dL — AB (ref 3.5–5.0)
ALT: 11 U/L — ABNORMAL LOW (ref 14–54)
ANION GAP: 7 (ref 5–15)
AST: 14 U/L — ABNORMAL LOW (ref 15–41)
Alkaline Phosphatase: 87 U/L (ref 38–126)
BILIRUBIN TOTAL: 0.4 mg/dL (ref 0.3–1.2)
BUN: 8 mg/dL (ref 6–20)
CHLORIDE: 126 mmol/L — AB (ref 101–111)
CO2: 7 mmol/L — ABNORMAL LOW (ref 22–32)
Calcium: 7.3 mg/dL — ABNORMAL LOW (ref 8.9–10.3)
Creatinine, Ser: 1.11 mg/dL — ABNORMAL HIGH (ref 0.44–1.00)
GFR calc Af Amer: 57 mL/min — ABNORMAL LOW (ref >60)
GFR calc non Af Amer: 49 mL/min — ABNORMAL LOW (ref >60)
GLUCOSE: 90 mg/dL (ref 65–99)
POTASSIUM: 3.2 mmol/L — AB (ref 3.5–5.1)
SODIUM: 140 mmol/L (ref 135–145)
TOTAL PROTEIN: 5.8 g/dL — AB (ref 6.5–8.1)

## 2017-06-11 LAB — MAGNESIUM: Magnesium: 1.3 mg/dL — ABNORMAL LOW (ref 1.7–2.4)

## 2017-06-11 LAB — PHOSPHORUS: Phosphorus: 2.4 mg/dL — ABNORMAL LOW (ref 2.5–4.6)

## 2017-06-11 MED ORDER — MAGNESIUM SULFATE 2 GM/50ML IV SOLN
2.0000 g | Freq: Once | INTRAVENOUS | Status: AC
  Administered 2017-06-11: 2 g via INTRAVENOUS
  Filled 2017-06-11: qty 50

## 2017-06-11 MED ORDER — POTASSIUM CHLORIDE 10 MEQ/100ML IV SOLN
10.0000 meq | INTRAVENOUS | Status: DC
  Filled 2017-06-11 (×4): qty 100

## 2017-06-11 MED ORDER — POTASSIUM CHLORIDE 2 MEQ/ML IV SOLN
INTRAVENOUS | Status: DC
  Administered 2017-06-11 (×2): via INTRAVENOUS
  Filled 2017-06-11 (×3): qty 1000

## 2017-06-11 MED ORDER — ENOXAPARIN SODIUM 40 MG/0.4ML ~~LOC~~ SOLN
40.0000 mg | SUBCUTANEOUS | Status: DC
  Administered 2017-06-11 – 2017-06-16 (×6): 40 mg via SUBCUTANEOUS
  Filled 2017-06-11 (×6): qty 0.4

## 2017-06-11 NOTE — Progress Notes (Signed)
Patient ID: Danielle [name], female   DOB: April 04, 1946, 71 y.o.   MRN: [id number]  [facility] Surgery Progress Note  1 Day Post-Op  Subjective: CC- dysphagia Patient states that she is doing well with liquids. She took in 936cc yesterday. She is having bowel function and no current abdominal pain. States that she does feel very weak, and did not ambulate very much yesterday.  Objective: Vital signs in last 24 hours: Temp:  [97.6 F (36.4 C)-98.2 F (36.8 C)] 98.2 F (36.8 C) (07/06 0519) Pulse Rate:  [75-89] 89 (07/06 0519) Resp:  [14-18] 16 (07/06 0519) BP: (88-101)/(50-61) 92/50 (07/06 0519) SpO2:  [95 %-100 %] 100 % (07/06 0519) Weight:  [114 lb (51.7 kg)] 114 lb (51.7 kg) (07/05 0835) Last BM Date: 06/11/17  Intake/Output from previous day: 07/05 0701 - 07/06 0700 In: 2794.3 [P.O.:936; I.V.:1808.3; IV Piggyback:50] Out: 0  Intake/Output this shift: No intake/output data recorded.  PE: Gen:  Alert, NAD, pleasant HEENT: EOM's intact, pupils equal  Card:  RRR, no M/G/R heard Pulm:  CTAB, no W/R/R, effort normal Abd: well healed midline incision and previous colostomy site rlq, soft, ND, +BS, no masses or organomegaly. No abdominal tenderness Ext:  No erythema, edema, or tenderness BUE/BLE  Psych: A&Ox3  Skin: no rashes noted, warm and dry  Lab Results:   Recent Labs  06/08/17 1543 06/11/17 0419  WBC 12.4* 9.1  HGB 11.8* 8.5*  HCT 35.4* 25.8*  PLT 195 137*   BMET  Recent Labs  06/10/17 1826 06/11/17 0419  NA 141 140  K 2.8* 3.2*  CL 128* 126*  CO2 7* 7*  GLUCOSE 116* 90  BUN 10 8  CREATININE 1.18* 1.11*  CALCIUM 7.3* 7.3*   PT/INR No results for input(s): LABPROT, INR in the last 72 hours. CMP     Component Value Date/Time   NA 140 06/11/2017 0419   K 3.2 (L) 06/11/2017 0419   CL 126 (H) 06/11/2017 0419   CO2 7 (L) 06/11/2017 0419   GLUCOSE 90 06/11/2017 0419   BUN 8 06/11/2017 0419   CREATININE 1.11 (H) 06/11/2017 0419   CREATININE  1.37 (H) 03/01/2017 1149   CALCIUM 7.3 (L) 06/11/2017 0419   PROT 5.8 (L) 06/11/2017 0419   ALBUMIN 2.4 (L) 06/11/2017 0419   AST 14 (L) 06/11/2017 0419   ALT 11 (L) 06/11/2017 0419   ALKPHOS 87 06/11/2017 0419   BILITOT 0.4 06/11/2017 0419   GFRNONAA 49 (L) 06/11/2017 0419   GFRNONAA 42 (L) 11/15/2013 0934   GFRAA 57 (L) 06/11/2017 0419   GFRAA 49 (L) 11/15/2013 0934   Lipase     Component Value Date/Time   LIPASE 52 (H) 06/08/2017 1543       Studies/Results: Dg Esophagus  Result Date: 06/10/2017 CLINICAL DATA:  Dysphagia EXAM: ESOPHOGRAM/BARIUM SWALLOW TECHNIQUE: Single contrast examination was performed using  thin barium. FLUOROSCOPY TIME:  Fluoroscopy Time:  1 minutes 6 seconds Radiation Exposure Index (if provided by the fluoroscopic device): 2.9 mGy Number of Acquired Spot Images: 0 COMPARISON:  CT chest 05/08/2017 FINDINGS: Limited study as the patient would only take small sips, min refused to drink more prior to completing the study. There is a large hiatal hernia. The esophagus deviates to the right around the large hiatal hernia. There are no fixed esophageal strictures or visible masses. IMPRESSION: Limited study as above. Deviation of the mid to distal esophagus around a large hiatal hernia. No fixed strictures. Electronically Signed   By: Lennette Bihari  [place] M.D.   On: 06/10/2017 11:01    Anti-infectives: Anti-infectives    Start     Dose/Rate Route Frequency Ordered Stop   06/08/17 2000  cefTRIAXone (ROCEPHIN) 1 g in dextrose 5 % 50 mL IVPB     1 g 100 mL/hr over 30 Minutes Intravenous Every 24 hours 06/08/17 1954         Assessment/Plan Crohn's disease - on Remicade, followed by Dr. Laural Golden GERD - controlled with pantoprazole Rectovaginal fistula AKI - Cr trending down 1.11 Hypokalemia, Hypomagnesemia, Hypophosphatemia - replacement per medicine  Dysphagia - h/o history of Schatzki's ring - EGD with balloon dilation 03/15/2017: moderate Schatzki ring, large  hiatal hernia. Per report patient did improve some after this procedure. Large hiatal hernia - abdominal surgical history: appendectomy, cholecystectomy, 2-3 small bowel resections by Dr. Tamala Julian ~2005, colostomy followed by colostomy takedown 2009 by Dr. Anthony Sar - at least 3 weeks of severe dysphagia and inability to tolerate any solid food - DG esophagus 7/5 showed deviation of the mid to distal esophagus around large hiatal hernia, no fixed strictures  ID - rocephin 7/3>> VTE - SCDs, lovenox FEN - full liquids, Boost TID  Plan - Doing ok on liquid diet. Patient still considering hiatal hernia repair this admission. She spoke with her sister and brother yesterday, sister will come by hospital later today. Tentatively planning for OR next week. Continue electrolyte replacement and Boost TID.   LOS: 3 days    Jerrye Beavers , [facility] Surgery 06/11/2017, 7:44 AM Pager: [phone] Consults: [phone] Mon-Fri 7:00 am-4:30 pm Sat-Sun 7:00 am-11:30 am

## 2017-06-11 NOTE — Progress Notes (Signed)
PROGRESS NOTE    Danielle [name]  YKZ:[id number] DOB: Apr 09, 1946 DOA: 06/08/2017 PCP: Iona Beard, MD  Outpatient Specialists:   [facility]    Brief Narrative:   49 ? Psoriasis Complicated Crohn's disease with perirectal abscesses  Status post I&D 04/21/2017 Dr. Ninfa Linden general surgery  In the past has refused diverting colostomy--supposed to see Dr. 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 Laren Everts has had dilationj ~ 1 year prior in 2017  On admission tells me that she has significant dysphagia and cannot swallow solid foods Only apparently has been able to drink milkshakes and liquids and keep these down with success She also has abdominal pain in the epigastrium as well as low back pain  Because of her persisting symptoms we tried to order on July 4 a he suffered gram which was not possible and subsequently consulted GI who felt that an endoscopy was necessary to rule out stricture Esophageogram 7/5 eventually done confirmed large hiatal hernia General surgery consulted for input regarding possible surgery for the same  Assessment & Plan:   Principal Problem:   Nausea and vomiting Active Problems:   Leukocytosis   Hypomagnesemia   Acute renal failure superimposed on stage 3 chronic kidney disease ([place])   Crohn disease ([place])   Hypokalemia   Acute lower UTI   Protein-calorie malnutrition, severe   Dysphagia  ? Stricture + Large hiatal hernias  GI consulted, esophageal gram 06/09/2017 = large hiatal hernia  General surgery consulted 06/10/17-"??diaphragmatico abdominopexy [?]"  Requested Gen surg to see patient and explain procedure to patient/family  Volume  depletion secondary to or by mouth intake Hypokalemia, hypomagnesemia today 1.3--given -2 grams of magnesium 7/6 Chr Metabolic acidosis from vol depletion [chr diarrhea] on admit-metabolic acidosis is not improved Contraction alkalosis probably secondary to diarrhea PICC 7/5 for repletion and further lab draw  Severe hypokalemia-of 2.3 06/10/2017-replace potassium with saline D5   K now 3.2, will change tonicity IV fluid to 40 of K  Labs a.m.   Possible UTI  Urine culture 100,000 K pneumonia  Continue Rocephin and trend CBC in a.m.  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 Laural Golden  Currently getting Remicade infusions as OP  Continue Lomotil q 4 prn, Coletipol 2 tab bid  Back spasm/lumbago  Dc fentanyl and oxycodone  Trial flexeril 5 mg tid spasm scheduled-->robaxin as not effective  Have added when necessary low-dose morphine IV 7/5  Severe malnutrition in the context of chronic disease  High risk for refeeding as per dietitian  Total protein 5.8, albumin 2.4  Get prealbumin  Add on phosphorus and magnesium 7.6.18  scd No family present--I did speak in detail with I did see's per what the next steps would be-general surgery Dr. Marcello Moores and I did discuss the patient's case (Dr. Marcello Moores knows the patient well from prior surgeries) and apparently only a few of the surgeons in their factors do more complex cases--I've suggested to the sister to follow-up on Monday and be available to discuss personally with the surgeon options for surgery Appreciate  GI input, Gen surg input Unclear dispo  Consultants:    Dr. Collene Mares 7/4  Gen surg 7/5 dr toth  Procedures:   None yet  Antimicrobials:    rocephin 7/3    Subjective:  Pleasant Out of room today for mutiple procedures Having some abd pain, no  vomit No fever, no chills Still has chr diarr, but no fever   Objective: Vitals:   06/10/17 1313 06/10/17 2045 06/11/17 0519 06/11/17 1400  BP: 101/61 (!) 96/55 (!) 92/50 (!) 100/53  Pulse: 84 86 89 85  Resp: 16 18 16 18   Temp: 97.6 F (36.4 C) 97.6 F (36.4 C) 98.2 F (36.8 C) 98.5 F (36.9 C)  TempSrc: Oral Oral Oral Oral  SpO2: 100% 100% 100% 100%  Weight:      Height:        Intake/Output Summary (Last 24 hours) at 06/11/17 1453 Last data filed at 06/11/17 1022  Gross per 24 hour  Intake             2757 ml  Output                0 ml  Net             2757 ml   Filed Weights   06/08/17 1536 06/10/17 0835  Weight: 51.7 kg (114 lb) 51.7 kg (114 lb)    Examination:  Alert pleasant seem confortable cta b, no added sound, no sputum No rales abd soft, slight tender in epigastrium s1 s2 no m/r/g,  No le edema Psych euthymic  Data Reviewed: I have personally reviewed following labs and imaging studies  CBC:  Recent Labs Lab 06/08/17 1543 06/11/17 0419  WBC 12.4* 9.1  NEUTROABS  --  2.5  HGB 11.8* 8.5*  HCT 35.4* 25.8*  MCV 81.0 81.9  PLT 195 209*   Basic Metabolic Panel:  Recent Labs Lab 06/08/17 1543 06/10/17 0810 06/10/17 1826 06/11/17 0419  NA 141 143 141 140  K 2.8* 2.3* 2.8* 3.2*  CL 118* 123* 128* 126*  CO2 13* 8* 7* 7*  GLUCOSE 98 80 116* 90  BUN 24* 13 10 8   CREATININE 1.60* 1.30* 1.18* 1.11*  CALCIUM 7.6* 7.8* 7.3* 7.3*  MG 1.3* 1.6*  --  1.3*  PHOS  --   --   --  2.4*   GFR: Estimated Creatinine Clearance: 39 mL/min (A) (by C-G formula based on SCr of 1.11 mg/dL (H)). Liver Function Tests:  Recent Labs Lab 06/08/17 1543 06/10/17 0810 06/11/17 0419  AST 30 17 14*  ALT 21 13* 11*  ALKPHOS 128* 90 87  BILITOT 0.6 0.4 0.4  PROT 8.0 6.0* 5.8*  ALBUMIN 3.6 2.6* 2.4*    Recent Labs Lab 06/08/17 1543  LIPASE 52*   No results for input(s): AMMONIA in the last 168 hours. Coagulation Profile: No results for input(s):  INR, PROTIME in the last 168 hours. Cardiac Enzymes: No results for input(s): CKTOTAL, CKMB, CKMBINDEX, TROPONINI in the last 168 hours. BNP (last 3 results) No results for input(s): PROBNP in the last 8760 hours. HbA1C: No results for input(s): HGBA1C in the last 72 hours. CBG: No results for input(s): GLUCAP in the last 168 hours. Lipid Profile: No results for input(s): CHOL, HDL, LDLCALC, TRIG, CHOLHDL, LDLDIRECT in the last 72 hours. Thyroid Function Tests: No results for input(s): TSH, T4TOTAL, FREET4, T3FREE, THYROIDAB in the last 72 hours. Anemia Panel: No results for input(s): VITAMINB12, FOLATE, FERRITIN, TIBC, IRON, RETICCTPCT in the  last 72 hours. Urine analysis:    Component Value Date/Time   COLORURINE YELLOW 06/08/2017 1539   APPEARANCEUR CLOUDY (A) 06/08/2017 1539   LABSPEC 1.010 06/08/2017 1539   PHURINE 6.0 06/08/2017 1539   GLUCOSEU NEGATIVE 06/08/2017 1539   HGBUR MODERATE (A) 06/08/2017 1539   BILIRUBINUR NEGATIVE 06/08/2017 1539   KETONESUR NEGATIVE 06/08/2017 1539   PROTEINUR 30 (A) 06/08/2017 1539   UROBILINOGEN 0.2 07/29/2015 2231   NITRITE NEGATIVE 06/08/2017 1539   LEUKOCYTESUR LARGE (A) 06/08/2017 1539   Sepsis Labs: @LABRCNTIP (procalcitonin:4,lacticidven:4)  ) Recent Results (from the past 240 hour(s))  Urine Culture     Status: Abnormal   Collection Time: 06/08/17  3:39 PM  Result Value Ref Range Status   Specimen Description URINE, RANDOM  Final   Special Requests NONE  Final   Culture >=100,000 COLONIES/mL KLEBSIELLA PNEUMONIAE (A)  Final   Report Status 06/11/2017 FINAL  Final   Organism ID, Bacteria KLEBSIELLA PNEUMONIAE (A)  Final      Susceptibility   Klebsiella pneumoniae - MIC*    AMPICILLIN >=32 RESISTANT Resistant     CEFAZOLIN <=4 SENSITIVE Sensitive     CEFTRIAXONE <=1 SENSITIVE Sensitive     CIPROFLOXACIN <=0.25 SENSITIVE Sensitive     GENTAMICIN <=1 SENSITIVE Sensitive     IMIPENEM <=0.25 SENSITIVE Sensitive      NITROFURANTOIN 64 INTERMEDIATE Intermediate     TRIMETH/SULFA <=20 SENSITIVE Sensitive     AMPICILLIN/SULBACTAM <=2 SENSITIVE Sensitive     PIP/TAZO <=4 SENSITIVE Sensitive     Extended ESBL NEGATIVE Sensitive     * >=100,000 COLONIES/mL KLEBSIELLA PNEUMONIAE  MRSA PCR Screening     Status: None   Collection Time: 06/09/17  9:15 AM  Result Value Ref Range Status   MRSA by PCR NEGATIVE NEGATIVE Final    Comment:        The GeneXpert MRSA Assay (FDA approved for NASAL specimens only), is one component of a comprehensive MRSA colonization surveillance program. It is not intended to diagnose MRSA infection nor to guide or monitor treatment for MRSA infections.          Radiology Studies: Dg Esophagus  Result Date: 06/10/2017 CLINICAL DATA:  Dysphagia EXAM: ESOPHOGRAM/BARIUM SWALLOW TECHNIQUE: Single contrast examination was performed using  thin barium. FLUOROSCOPY TIME:  Fluoroscopy Time:  1 minutes 6 seconds Radiation Exposure Index (if provided by the fluoroscopic device): 2.9 mGy Number of Acquired Spot Images: 0 COMPARISON:  CT chest 05/08/2017 FINDINGS: Limited study as the patient would only take small sips, min refused to drink more prior to completing the study. There is a large hiatal hernia. The esophagus deviates to the right around the large hiatal hernia. There are no fixed esophageal strictures or visible masses. IMPRESSION: Limited study as above. Deviation of the mid to distal esophagus around a large hiatal hernia. No fixed strictures. Electronically Signed   By: Rolm Baptise M.D.   On: 06/10/2017 11:01        Scheduled Meds: . calcium-vitamin D  1 tablet Oral Daily  . colestipol  2 g Oral BID  . cyanocobalamin  1,000 mcg Intramuscular Q30 days  . enoxaparin (LOVENOX) injection  40 mg Subcutaneous Q24H  . feeding supplement  1 Container Oral TID BM  . methocarbamol  750 mg Oral TID  . multivitamin with minerals  1 tablet Oral Daily  . pantoprazole  40 mg Oral  BID  . saccharomyces boulardii  250 mg Oral Daily  . sodium bicarbonate  650 mg  Oral BID  . sodium chloride flush  3 mL Intravenous Q12H   Continuous Infusions: . cefTRIAXone (ROCEPHIN)  IV Stopped (06/10/17 2032)  . sodium chloride 0.9 % 1,000 mL with potassium chloride 80 mEq infusion 100 mL/hr at 06/11/17 0803     LOS: 3 days    Time spent: [place], MD [facility] ([facility]   If 7PM-7AM, please contact night-coverage [email or web address] Password [email or web address] 06/11/2017, 2:53 PM

## 2017-06-11 NOTE — Progress Notes (Signed)
[facility] Progress Note    Since last GI note: She met with general surgery yesterday and is interested in going ahead with [facility] repair surgery.   She is tolerating full liquid diet and will continue that for now.  Objective: Vital signs in last 24 hours: Temp:  [97.6 F (36.4 C)-98.2 F (36.8 C)] 98.2 F (36.8 C) (07/06 0519) Pulse Rate:  [75-89] 89 (07/06 0519) Resp:  [14-18] 16 (07/06 0519) BP: (88-101)/(50-61) 92/50 (07/06 0519) SpO2:  [95 %-100 %] 100 % (07/06 0519) Weight:  [114 lb (51.7 kg)] 114 lb (51.7 kg) (07/05 0835) Last BM Date: 06/11/17 General: alert and oriented times 3 Heart: regular rate and rythm Abdomen: soft, non-tender, non-distended, normal bowel sounds   Lab Results:  Recent Labs  06/08/17 1543 06/11/17 0419  WBC 12.4* 9.1  HGB 11.8* 8.5*  PLT 195 137*  MCV 81.0 81.9    Recent Labs  06/10/17 0810 06/10/17 1826 06/11/17 0419  NA 143 141 140  K 2.3* 2.8* 3.2*  CL 123* 128* 126*  CO2 8* 7* 7*  GLUCOSE 80 116* 90  BUN _0 CREATININE 1.30* 1.18* 1.11*  CALCIUM 7.8* 7.3* 7.3*    Recent Labs  06/08/17 1543 06/10/17 0810 06/11/17 0419  PROT 8.0 6.0* 5.8*  ALBUMIN 3.6 2.6* 2.4*  AST 30 17 14*  ALT 21 13* 11*  ALKPHOS 128* 90 87  BILITOT 0.6 0.4 0.4    Studies/Results: Dg Esophagus  Result Date: 06/10/2017 CLINICAL DATA:  Dysphagia EXAM: ESOPHOGRAM/BARIUM SWALLOW TECHNIQUE: Single contrast examination was performed using  thin barium. FLUOROSCOPY TIME:  Fluoroscopy Time:  1 minutes 6 seconds Radiation Exposure Index (if provided by the fluoroscopic device): 2.9 mGy Number of Acquired Spot Images: 0 COMPARISON:  CT chest 05/08/2017 FINDINGS: Limited study as the patient would only take small sips, min refused to drink more prior to completing the study. There is a large hiatal hernia. The esophagus deviates to the right around the large hiatal hernia. There are no fixed esophageal strictures or visible masses.  IMPRESSION: Limited study as above. Deviation of the mid to distal esophagus around a large hiatal hernia. No fixed strictures. Electronically Signed   By: Rolm Baptise M.D.   On: 06/10/2017 11:01     Medications: Scheduled Meds: . calcium-vitamin D  1 tablet Oral Daily  . colestipol  2 g Oral BID  . cyanocobalamin  1,000 mcg Intramuscular Q30 days  . enoxaparin (LOVENOX) injection  40 mg Subcutaneous Q24H  . feeding supplement  1 Container Oral TID BM  . methocarbamol  750 mg Oral TID  . multivitamin with minerals  1 tablet Oral Daily  . pantoprazole  40 mg Oral BID  . saccharomyces boulardii  250 mg Oral Daily  . sodium bicarbonate  650 mg Oral BID  . sodium chloride flush  3 mL Intravenous Q12H   Continuous Infusions: . cefTRIAXone (ROCEPHIN)  IV Stopped (06/10/17 2032)  . sodium chloride 0.9 % 1,000 mL with potassium chloride 80 mEq infusion 100 mL/hr at 06/10/17 2212   PRN Meds:.acetaminophen **OR** acetaminophen, albuterol, diphenoxylate-atropine, Gerhardt's butt cream, morphine injection, ondansetron **OR** ondansetron (ZOFRAN) IV, oxyCODONE-acetaminophen, sodium chloride flush    Assessment/Plan: 71 y.o. female with Crohn's disease; dysphagia, nausea; very large hiatal hernia containing most of her stomach and also some of her small bowel.  Greatly appreciate general surgery input.  Looks like they are planning hernia reduction, repair early next week.  Hb 11.8 down to 8.5 this  AM.  I think the 11.8 result is the outlier based on review of labs back to early June).  No signs of overt bleeding.  Please call or page with any further questions or concerns.   Milus Banister, MD  06/11/2017, 7:52 AM [facility] Pager [phone]

## 2017-06-12 ENCOUNTER — Inpatient Hospital Stay (HOSPITAL_COMMUNITY): Payer: Medicare HMO

## 2017-06-12 LAB — CBC
HEMATOCRIT: 27.9 % — AB (ref 36.0–46.0)
HEMOGLOBIN: 9 g/dL — AB (ref 12.0–15.0)
MCH: 26.8 pg (ref 26.0–34.0)
MCHC: 32.3 g/dL (ref 30.0–36.0)
MCV: 83 fL (ref 78.0–100.0)
Platelets: 144 10*3/uL — ABNORMAL LOW (ref 150–400)
RBC: 3.36 MIL/uL — AB (ref 3.87–5.11)
RDW: 18.7 % — ABNORMAL HIGH (ref 11.5–15.5)
WBC: 13.6 10*3/uL — AB (ref 4.0–10.5)

## 2017-06-12 LAB — PHOSPHORUS: Phosphorus: 2.5 mg/dL (ref 2.5–4.6)

## 2017-06-12 LAB — COMPREHENSIVE METABOLIC PANEL
ALT: 15 U/L (ref 14–54)
AST: 21 U/L (ref 15–41)
Albumin: 2.7 g/dL — ABNORMAL LOW (ref 3.5–5.0)
Alkaline Phosphatase: 98 U/L (ref 38–126)
BUN: 6 mg/dL (ref 6–20)
CALCIUM: 7.6 mg/dL — AB (ref 8.9–10.3)
Chloride: 126 mmol/L — ABNORMAL HIGH (ref 101–111)
Creatinine, Ser: 0.93 mg/dL (ref 0.44–1.00)
GFR calc non Af Amer: >60 mL/min (ref >60)
GLUCOSE: 115 mg/dL — AB (ref 65–99)
Potassium: 4.2 mmol/L (ref 3.5–5.1)
SODIUM: 140 mmol/L (ref 135–145)
Total Bilirubin: 0.2 mg/dL — ABNORMAL LOW (ref 0.3–1.2)
Total Protein: 6.4 g/dL — ABNORMAL LOW (ref 6.5–8.1)

## 2017-06-12 LAB — MAGNESIUM: MAGNESIUM: 1.6 mg/dL — AB (ref 1.7–2.4)

## 2017-06-12 MED ORDER — FUROSEMIDE 20 MG PO TABS
20.0000 mg | ORAL_TABLET | Freq: Once | ORAL | Status: AC
  Administered 2017-06-12: 20 mg via ORAL
  Filled 2017-06-12: qty 1

## 2017-06-12 MED ORDER — MAGNESIUM OXIDE 400 (241.3 MG) MG PO TABS
800.0000 mg | ORAL_TABLET | Freq: Two times a day (BID) | ORAL | Status: DC
  Administered 2017-06-12: 800 mg via ORAL
  Filled 2017-06-12 (×4): qty 2

## 2017-06-12 MED ORDER — FUROSEMIDE 10 MG/ML IJ SOLN
20.0000 mg | Freq: Once | INTRAMUSCULAR | Status: AC
  Administered 2017-06-12 (×2): 20 mg via INTRAVENOUS

## 2017-06-12 MED ORDER — FUROSEMIDE 10 MG/ML IJ SOLN
INTRAMUSCULAR | Status: AC
  Administered 2017-06-12: 20 mg via INTRAVENOUS
  Filled 2017-06-12: qty 2

## 2017-06-12 NOTE — Progress Notes (Signed)
PROGRESS NOTE    Danielle [name]  KDT:[id number] DOB: [birth year] DOA: 06/08/2017 PCP: Iona Beard, MD  Outpatient Specialists:   [facility]    Brief Narrative:   44 ? Psoriasis Complicated Crohn's disease with perirectal abscesses  Status post I&D 04/21/2017 Dr. Ninfa Linden general surgery  In the past has refused diverting colostomy--supposed to see Dr. 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 Laren Everts has had dilationj ~ 1 year prior in 2017  On admission tells me that she has significant dysphagia and cannot swallow solid foods Only apparently has been able to drink milkshakes and liquids and keep these down with success She also has abdominal pain in the epigastrium as well as low back pain  Because of her persisting symptoms we tried to order on July 4 a he suffered gram which was not possible and subsequently consulted GI who felt that an endoscopy was necessary to rule out stricture Esophageogram 7/5 eventually done confirmed large hiatal hernia General surgery consulted for input regarding possible surgery for the same  Assessment & Plan:   Principal Problem:   Nausea and vomiting Active Problems:   Leukocytosis   Hypomagnesemia   Acute renal failure superimposed on stage 3 chronic kidney disease ([place])   Crohn disease ([place])   Hypokalemia   Acute lower UTI   Protein-calorie malnutrition, severe  Iatrogenic volume overload 06/12/2017 Acute hypoxic respiratory failure RR ?  Crackles on exam Was getting 100 cc/h--7 liter +  Stop IVF  Lasix 20 x 1 IV now  Reassess today  Chest x-ray examined and the hat from my perspective if shows mild bibasilar  effusions and  fullness of the aortic knuckleraising concern for mild fluid overload  Dysphagia  ? Stricture + Large hiatal hernias  GI consulted, esophageal gram 06/09/2017 = large hiatal hernia  General surgery consulted 06/10/17-"??diaphragmatico abdominopexy [?]"  Requested Gen surg to see patient and explain procedure to patient/family    Volume depletion secondary to or by mouth intake Hypokalemia, hypomagnesemia today 1.3--given -2 grams of magnesium 7/6 Chr Metabolic acidosis from vol depletion [chr diarrhea] on admit-metabolic acidosis is not improved Contraction alkalosis probably secondary to diarrhea PICC 7/5 for repletion and further lab draw  Severe hypokalemia-of 2.3 06/10/2017-replace potassium with saline D5   K=4.2   Stop all IVF for now 06/12/2017   Asymptomatic bacteriuria--patient urinated in cup on admit  Urine culture 100,000 K pneumonia  Stop rocephin-not infectious  Acute complicated Crohn's disease Had fistula surgery -perineal + seton removed 2017 I&D  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 Laural Golden  Currently getting Remicade infusions as OP  Continue Lomotil q 4 prn, Coletipol 2 tab bid  Back spasm/lumbago  Dc fentanyl and oxycodone  Trial flexeril 5 mg tid spasm scheduled-->robaxin as not effective  Have added when necessary low-dose morphine IV 7/5--pain ic controlled  Severe malnutrition in the context of chronic disease  High risk for refeeding as per dietitian  Total protein 5.8, albumin 2.4  Get prealbumin  Add on phosphorus and magnesium 7.7.18   Replace Mag with Mag ox 400 tid, Give Neutraphos PO if deficient late ron  scd D/w niece at bedside-follow labs from am Appreciate GI input, Gen surg input Unclear dispo  Consultants:    Dr. Collene Mares 7/4  Gen surg 7/5 dr toth  Procedures:   None yet  Antimicrobials:    rocephin 7/3     Subjective:  Visibly sob ? wob No cp No cough No fever abd pain much improved   Objective: Vitals:   06/11/17 0519 06/11/17 1400 06/11/17 2226 06/12/17 0558  BP: (!) 92/50 (!) 100/53 108/62 106/65  Pulse: 89 85 95 100  Resp: 16 18 20 20   Temp: 98.2 F (36.8 C) 98.5 F (36.9 C) 98.2 F (36.8 C) 98.1 F (36.7 C)  TempSrc: Oral Oral Oral Oral  SpO2: 100% 100% 99% 95%  Weight:      Height:        Intake/Output Summary (Last 24 hours) at 06/12/17 0951 Last data filed at 06/12/17 3785  Gross per 24 hour  Intake          1895.01 ml  Output                0 ml  Net          1895.01 ml   Filed Weights   06/08/17 1536 06/10/17 0835  Weight: 51.7 kg (114 lb) 51.7 kg (114 lb)    Examination:  Sob, working hard, sats 80's eomim ncat abd soft nt nd no rebound Chest crackles lower post bilat No le edema No jvd Neuro intact-slight anxious  Data Reviewed: I have personally reviewed following labs and imaging studies  CBC:  Recent Labs Lab 06/08/17 1543 06/11/17 0419  WBC 12.4* 9.1  NEUTROABS  --  2.5  HGB 11.8* 8.5*  HCT 35.4* 25.8*  MCV 81.0 81.9  PLT 195 885*   Basic Metabolic Panel:  Recent Labs Lab 06/08/17 1543 06/10/17 0810 06/10/17 1826 06/11/17 0419 06/11/17 1822  NA 141 143 141 140 142  K 2.8* 2.3* 2.8* 3.2* 4.2  CL 118* 123* 128* 126* 128*  CO2 13* 8* 7* 7* 7*  GLUCOSE 98 80 116* 90 80  BUN 24* 13 10 8 6   CREATININE 1.60* 1.30* 1.18* 1.11* 0.95  CALCIUM 7.6* 7.8* 7.3* 7.3* 7.4*  MG 1.3* 1.6*  --  1.3*  --   PHOS  --   --   --  2.4*  --    GFR: Estimated Creatinine Clearance: 45.6 mL/min (by C-G formula based on SCr of 0.95 mg/dL). Liver Function Tests:  Recent Labs Lab 06/08/17 1543 06/10/17 0810 06/11/17 0419  AST 30 17 14*  ALT 21 13* 11*  ALKPHOS 128* 90 87  BILITOT 0.6 0.4 0.4  PROT 8.0 6.0* 5.8*  ALBUMIN 3.6 2.6* 2.4*    Recent Labs Lab 06/08/17 1543  LIPASE 52*   No results for input(s): AMMONIA in the last  168 hours. Coagulation Profile: No results for input(s): INR, PROTIME in the last 168 hours. Cardiac Enzymes: No results for input(s): CKTOTAL, CKMB, CKMBINDEX, TROPONINI in the last 168 hours. BNP (last 3 results) No results for input(s): PROBNP in the last 8760 hours. HbA1C: No results for input(s): HGBA1C in the last 72 hours. CBG: No results for input(s): GLUCAP in the last 168 hours. Lipid Profile: No results for input(s): CHOL, HDL, LDLCALC, TRIG, CHOLHDL, LDLDIRECT in the last 72 hours. Thyroid Function Tests: No results for input(s): TSH, T4TOTAL, FREET4, T3FREE, THYROIDAB in the last 72 hours. Anemia Panel: No results for input(s): VITAMINB12, FOLATE, FERRITIN, TIBC, IRON, RETICCTPCT  in the last 72 hours. Urine analysis:    Component Value Date/Time   COLORURINE YELLOW 06/08/2017 1539   APPEARANCEUR CLOUDY (A) 06/08/2017 1539   LABSPEC 1.010 06/08/2017 1539   PHURINE 6.0 06/08/2017 1539   GLUCOSEU NEGATIVE 06/08/2017 1539   HGBUR MODERATE (A) 06/08/2017 1539   BILIRUBINUR NEGATIVE 06/08/2017 1539   KETONESUR NEGATIVE 06/08/2017 1539   PROTEINUR 30 (A) 06/08/2017 1539   UROBILINOGEN 0.2 07/29/2015 2231   NITRITE NEGATIVE 06/08/2017 1539   LEUKOCYTESUR LARGE (A) 06/08/2017 1539   Sepsis Labs: @LABRCNTIP (procalcitonin:4,lacticidven:4)  ) Recent Results (from the past 240 hour(s))  Urine Culture     Status: Abnormal   Collection Time: 06/08/17  3:39 PM  Result Value Ref Range Status   Specimen Description URINE, RANDOM  Final   Special Requests NONE  Final   Culture >=100,000 COLONIES/mL KLEBSIELLA PNEUMONIAE (A)  Final   Report Status 06/11/2017 FINAL  Final   Organism ID, Bacteria KLEBSIELLA PNEUMONIAE (A)  Final      Susceptibility   Klebsiella pneumoniae - MIC*    AMPICILLIN >=32 RESISTANT Resistant     CEFAZOLIN <=4 SENSITIVE Sensitive     CEFTRIAXONE <=1 SENSITIVE Sensitive     CIPROFLOXACIN <=0.25 SENSITIVE Sensitive     GENTAMICIN <=1 SENSITIVE  Sensitive     IMIPENEM <=0.25 SENSITIVE Sensitive     NITROFURANTOIN 64 INTERMEDIATE Intermediate     TRIMETH/SULFA <=20 SENSITIVE Sensitive     AMPICILLIN/SULBACTAM <=2 SENSITIVE Sensitive     PIP/TAZO <=4 SENSITIVE Sensitive     Extended ESBL NEGATIVE Sensitive     * >=100,000 COLONIES/mL KLEBSIELLA PNEUMONIAE  MRSA PCR Screening     Status: None   Collection Time: 06/09/17  9:15 AM  Result Value Ref Range Status   MRSA by PCR NEGATIVE NEGATIVE Final    Comment:        The GeneXpert MRSA Assay (FDA approved for NASAL specimens only), is one component of a comprehensive MRSA colonization surveillance program. It is not intended to diagnose MRSA infection nor to guide or monitor treatment for MRSA infections.          Radiology Studies: Dg Esophagus  Result Date: 06/10/2017 CLINICAL DATA:  Dysphagia EXAM: ESOPHOGRAM/BARIUM SWALLOW TECHNIQUE: Single contrast examination was performed using  thin barium. FLUOROSCOPY TIME:  Fluoroscopy Time:  1 minutes 6 seconds Radiation Exposure Index (if provided by the fluoroscopic device): 2.9 mGy Number of Acquired Spot Images: 0 COMPARISON:  CT chest 05/08/2017 FINDINGS: Limited study as the patient would only take small sips, min refused to drink more prior to completing the study. There is a large hiatal hernia. The esophagus deviates to the right around the large hiatal hernia. There are no fixed esophageal strictures or visible masses. IMPRESSION: Limited study as above. Deviation of the mid to distal esophagus around a large hiatal hernia. No fixed strictures. Electronically Signed   By: Rolm Baptise M.D.   On: 06/10/2017 11:01        Scheduled Meds: . calcium-vitamin D  1 tablet Oral Daily  . colestipol  2 g Oral BID  . cyanocobalamin  1,000 mcg Intramuscular Q30 days  . enoxaparin (LOVENOX) injection  40 mg Subcutaneous Q24H  . feeding supplement  1 Container Oral TID BM  . methocarbamol  750 mg Oral TID  . multivitamin with  minerals  1 tablet Oral Daily  . pantoprazole  40 mg Oral BID  . saccharomyces boulardii  250 mg Oral Daily  . sodium bicarbonate  650 mg Oral BID  . sodium chloride flush  3 mL Intravenous Q12H   Continuous Infusions: . cefTRIAXone (ROCEPHIN)  IV Stopped (06/11/17 2324)     LOS: 4 days    Time spent: 35  Addendum At the end of rounds patient was reexamined and respiratory rate had 60 8 dropped and she came off of oxygen. Repeat labs a.m.  Verneita Griffes, MD [phone]   If 7PM-7AM, please contact night-coverage [email or web address] Password [email or web address] 06/12/2017, 9:51 AM

## 2017-06-12 NOTE — Progress Notes (Signed)
Patient reassessed, patient reported she feels better, no labored breathing or wheezing noted, in bed resting and family at the bedside visiting. Will continue to assess patient.

## 2017-06-12 NOTE — Progress Notes (Signed)
Patient noted with SOB at rest and labored breathing, wheezing and oxygen sat 86%-RA DR. Samtani notified and at the bedside assessing patient. 2L O2 applied and ordered lasix given, will continue to monitor patient.

## 2017-06-12 NOTE — Progress Notes (Addendum)
2 Days Post-Op   Subjective/Chief Complaint: Patient reporting worsening shortness of breath.  Also having abdominal pain and pain through to her back.   Objective: Vital signs in last 24 hours: Temp:  [98.1 F (36.7 C)-98.5 F (36.9 C)] 98.1 F (36.7 C) (07/07 0558) Pulse Rate:  [85-100] 100 (07/07 0558) Resp:  [18-20] 20 (07/07 0558) BP: (100-108)/(53-65) 106/65 (07/07 0558) SpO2:  [95 %-100 %] 95 % (07/07 0558) Last BM Date: 06/11/17  Intake/Output from previous day: 07/06 0701 - 07/07 0700 In: 1775 [I.V.:1725; IV Piggyback:50] Out: 2 [Urine:1; Stool:1] Intake/Output this shift: Total I/O In: 120 [P.O.:120] Out: -   Exam: Appears short of breath Lungs mostly clear Abdomen soft, non-distended.  Lab Results:   Recent Labs  06/11/17 0419  WBC 9.1  HGB 8.5*  HCT 25.8*  PLT 137*   BMET  Recent Labs  06/11/17 0419 06/11/17 1822  NA 140 142  K 3.2* 4.2  CL 126* 128*  CO2 7* 7*  GLUCOSE 90 80  BUN 8 6  CREATININE 1.11* 0.95  CALCIUM 7.3* 7.4*   PT/INR No results for input(s): LABPROT, INR in the last 72 hours. ABG No results for input(s): PHART, HCO3 in the last 72 hours.  Invalid input(s): PCO2, PO2  Studies/Results: Dg Esophagus  Result Date: 06/10/2017 CLINICAL DATA:  Dysphagia EXAM: ESOPHOGRAM/BARIUM SWALLOW TECHNIQUE: Single contrast examination was performed using  thin barium. FLUOROSCOPY TIME:  Fluoroscopy Time:  1 minutes 6 seconds Radiation Exposure Index (if provided by the fluoroscopic device): 2.9 mGy Number of Acquired Spot Images: 0 COMPARISON:  CT chest 05/08/2017 FINDINGS: Limited study as the patient would only take small sips, min refused to drink more prior to completing the study. There is a large hiatal hernia. The esophagus deviates to the right around the large hiatal hernia. There are no fixed esophageal strictures or visible masses. IMPRESSION: Limited study as above. Deviation of the mid to distal esophagus around a large hiatal  hernia. No fixed strictures. Electronically Signed   By: Rolm Baptise M.D.   On: 06/10/2017 11:01    Anti-infectives: Anti-infectives    Start     Dose/Rate Route Frequency Ordered Stop   06/08/17 2000  cefTRIAXone (ROCEPHIN) 1 g in dextrose 5 % 50 mL IVPB     1 g 100 mL/hr over 30 Minutes Intravenous Every 24 hours 06/08/17 1954        Assessment/Plan: s/p Procedure(s): CANCELLED PROCEDURE   Large hiatal hernia  Acute SOB and worsening pain. Labs currently being drawn. Will check stat pCXR. Will notify Hospitalists  Regarding the hiatal hernia, she will need repair hopefuylly next week.given her symptoms.  Her abdomen seems benign on exam.  If CXR is not remarkable, she may need a CT to r/o a PE.  LOS: 4 days    Kristyl Athens A 06/12/2017

## 2017-06-13 LAB — MAGNESIUM: Magnesium: 1.5 mg/dL — ABNORMAL LOW (ref 1.7–2.4)

## 2017-06-13 LAB — COMPREHENSIVE METABOLIC PANEL
ALK PHOS: 91 U/L (ref 38–126)
ALT: 16 U/L (ref 14–54)
AST: 23 U/L (ref 15–41)
Albumin: 2.5 g/dL — ABNORMAL LOW (ref 3.5–5.0)
Anion gap: 6 (ref 5–15)
BILIRUBIN TOTAL: 0.1 mg/dL — AB (ref 0.3–1.2)
BUN: 7 mg/dL (ref 6–20)
CALCIUM: 7.8 mg/dL — AB (ref 8.9–10.3)
CO2: 8 mmol/L — ABNORMAL LOW (ref 22–32)
CREATININE: 0.81 mg/dL (ref 0.44–1.00)
Chloride: 129 mmol/L — ABNORMAL HIGH (ref 101–111)
Glucose, Bld: 85 mg/dL (ref 65–99)
Potassium: 3.3 mmol/L — ABNORMAL LOW (ref 3.5–5.1)
SODIUM: 143 mmol/L (ref 135–145)
Total Protein: 5.8 g/dL — ABNORMAL LOW (ref 6.5–8.1)

## 2017-06-13 LAB — PHOSPHORUS: Phosphorus: 3.4 mg/dL (ref 2.5–4.6)

## 2017-06-13 MED ORDER — MORPHINE SULFATE (PF) 10 MG/ML IV SOLN
0.5000 mg | INTRAVENOUS | Status: DC | PRN
  Administered 2017-06-14: 0.5 mg via INTRAVENOUS
  Filled 2017-06-13: qty 1

## 2017-06-13 MED ORDER — SODIUM BICARBONATE 650 MG PO TABS
1300.0000 mg | ORAL_TABLET | Freq: Two times a day (BID) | ORAL | Status: DC
  Administered 2017-06-16: 1300 mg via ORAL
  Filled 2017-06-13 (×4): qty 2

## 2017-06-13 MED ORDER — OXYCODONE-ACETAMINOPHEN 5-325 MG PO TABS
1.0000 | ORAL_TABLET | Freq: Four times a day (QID) | ORAL | Status: DC | PRN
  Administered 2017-06-16 (×2): 1 via ORAL
  Filled 2017-06-13 (×2): qty 1

## 2017-06-13 NOTE — Progress Notes (Signed)
PROGRESS NOTE    Danielle [name]  IRJ:[id number] DOB: 09-04-1946 DOA: 06/08/2017 PCP: Iona Beard, MD  Outpatient Specialists:   [facility]    Brief Narrative:   45 ? Psoriasis Complicated Crohn's disease with perirectal abscesses  Status post I&D 04/21/2017 Dr. Ninfa Linden general surgery  In the past has refused diverting colostomy--supposed to see Dr. 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 Laren Everts has had dilationj ~ 1 year prior in 2017  On admission tells me that she has significant dysphagia and cannot swallow solid foods Only apparently has been able to drink milkshakes and liquids and keep these down with success She also has abdominal pain in the epigastrium as well as low back pain  Because of her persisting symptoms we tried to order on July 4 a he suffered gram which was not possible and subsequently consulted GI who felt that an endoscopy was necessary to rule out stricture Esophageogram 7/5 eventually done confirmed large hiatal hernia General surgery consulted for input regarding possible surgery for the same  Assessment & Plan:   Principal Problem:   Nausea and vomiting Active Problems:   Leukocytosis   Hypomagnesemia   Acute renal failure superimposed on stage 3 chronic kidney disease ([place])   Crohn disease ([place])   Hypokalemia   Acute lower UTI   Protein-calorie malnutrition, severe  Iatrogenic volume overload 06/12/2017 Acute hypoxic respiratory failure RR ?  Crackles on exam Was getting 100 cc/h--still 8 L positive  Stop IVF  Lasix 20 x 1 IV now, also given by mouth Lasix 20 mg  Much improved -keep volume even for now  Dysphagia  ? Stricture  + Large hiatal hernias  GI consulted, esophageal gram 06/09/2017 = large hiatal hernia  General surgery consulted 06/10/17-"??diaphragmatico abdominopexy [?]"   Gen surg will coordinate and discuss with family a.m. 7/9    Volume depletion secondary to or by mouth intake-on admission creatinine was 1.6--->0.8 and has improved 7/8 Hypokalemia, hypomagnesemia today 1.3--given -2 grams of magnesium 7/6 Chr Metabolic acidosis from vol depletion [chr diarrhea] on admit-metabolic acidosis is not improved Contraction alkalosis probably secondary to diarrhea---diarrhea seems to have resolved 7/8 PICC 7/5 for repletion and further lab draw  Severe hypokalemia-of 2.3 06/10/2017-replace potassium with saline D5   Replace potassium K Dur 40 twice a day   Replace mag ox 800 twice a day 7/8   Increase NaCo3 650 twice a day-->1.3 bid times a day   Asymptomatic bacteriuria--patient urinated in cup on admit  Urine culture 100,000 K pneumonia  Stop rocephin-not infectious  Acute complicated Crohn's disease Had fistula surgery -perineal + seton removed 2017 I&D  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 Laural Golden  Currently getting Remicade infusions as OP  Continue Lomotil q 4 prn (refused medication overnight 7/8), Coletipol 2 tab bid  Back spasm/lumbago  Dc fentanyl and oxycodone  Trial and discontinued Robaxin 7/8  Have added when necessary low-dose morphine IV 7/5--pain ic controlled  Severe malnutrition in the context of chronic disease  High risk for refeeding as per dietitian  Total protein 5.8, albumin 2.4  Get prealbumin  Phosphorous okay  Magnesium as above  add prealbumin in a.m.  scd D/w niece at bedside--will discuss with sister a.m. Appreciate GI input, Gen surg input--probably will need hiatal hernia repair prior to discharge Unclear dispo  Consultants:    Dr. Collene Mares 7/4  Gen surg 7/5  dr toth  Procedures:   None yet  Antimicrobials:    rocephin 7/3    Subjective:  Shortness of breath has completely resolved and she looks her usual-  no distress and no nausea no vomiting Tolerating some diet Refused Imodium overnight Eating and drinking some of her diet No chest pain No nausea No shortness of breath   Objective: Vitals:   06/12/17 1345 06/12/17 2144 06/13/17 0551 06/13/17 1521  BP: (!) 105/56 113/65 105/61 (!) 98/58  Pulse: (!) 102 82 (!) 103 99  Resp: 20 (!) 22 (!) 22 18  Temp: 98 F (36.7 C) 97.7 F (36.5 C) 98 F (36.7 C) 97.9 F (36.6 C)  TempSrc: Oral Oral Oral Oral  SpO2: 98% 100% 98% 100%  Weight:      Height:       No intake or output data in the 24 hours ending 06/13/17 1628 Filed Weights   06/08/17 1536 06/10/17 0835  Weight: 51.7 kg (114 lb) 51.7 kg (114 lb)    Examination:  Alert edentulous pleasant no new issue EOMI NCAT no pallor or icterus Chest clear Abdomen soft no rebound no guarding No lower extremity edema S1-S2 no murmur rub or gallop Neurologically intact pleasant alert   Data Reviewed: I have personally reviewed following labs and imaging studies  CBC:  Recent Labs Lab 06/08/17 1543 06/11/17 0419 06/12/17 0930  WBC 12.4* 9.1 13.6*  NEUTROABS  --  2.5  --   HGB 11.8* 8.5* 9.0*  HCT 35.4* 25.8* 27.9*  MCV 81.0 81.9 83.0  PLT 195 137* 161*   Basic Metabolic Panel:  Recent Labs Lab 06/08/17 1543 06/10/17 0810 06/10/17 1826 06/11/17 0419 06/11/17 1822 06/12/17 0937 06/13/17 1005  NA 141 143 141 140 142 140 143  K 2.8* 2.3* 2.8* 3.2* 4.2 4.2 3.3*  CL 118* 123* 128* 126* 128* 126* 129*  CO2 13* 8* 7* 7* 7* <7* 8*  GLUCOSE 98 80 116* 90 80 115* 85  BUN 24* 13 10 8 6 6 7   CREATININE 1.60* 1.30* 1.18* 1.11* 0.95 0.93 0.81  CALCIUM 7.6* 7.8* 7.3* 7.3* 7.4* 7.6* 7.8*  MG 1.3* 1.6*  --  1.3*  --  1.6* 1.5*  PHOS  --   --   --  2.4*  --  2.5 3.4   GFR: Estimated Creatinine Clearance: 53.5 mL/min  (by C-G formula based on SCr of 0.81 mg/dL). Liver Function Tests:  Recent Labs Lab 06/08/17 1543 06/10/17 0810 06/11/17 0419 06/12/17 0937 06/13/17 1005  AST 30 17 14* 21 23  ALT 21 13* 11* 15 16  ALKPHOS 128* 90 87 98 91  BILITOT 0.6 0.4 0.4 0.2* 0.1*  PROT 8.0 6.0* 5.8* 6.4* 5.8*  ALBUMIN 3.6 2.6* 2.4* 2.7* 2.5*    Recent Labs Lab 06/08/17 1543  LIPASE 52*   No results for input(s): AMMONIA in the last 168 hours. Coagulation Profile: No results for input(s): INR, PROTIME in the last 168 hours. Cardiac Enzymes: No results for input(s): CKTOTAL, CKMB, CKMBINDEX, TROPONINI in the last 168 hours. BNP (last 3 results) No results for input(s): PROBNP in the last 8760 hours. HbA1C: No results for input(s): HGBA1C in the last 72 hours. CBG: No results for  input(s): GLUCAP in the last 168 hours. Lipid Profile: No results for input(s): CHOL, HDL, LDLCALC, TRIG, CHOLHDL, LDLDIRECT in the last 72 hours. Thyroid Function Tests: No results for input(s): TSH, T4TOTAL, FREET4, T3FREE, THYROIDAB in the last 72 hours. Anemia Panel: No results for input(s): VITAMINB12, FOLATE, FERRITIN, TIBC, IRON, RETICCTPCT in the last 72 hours. Urine analysis:    Component Value Date/Time   COLORURINE YELLOW 06/08/2017 1539   APPEARANCEUR CLOUDY (A) 06/08/2017 1539   LABSPEC 1.010 06/08/2017 1539   PHURINE 6.0 06/08/2017 1539   GLUCOSEU NEGATIVE 06/08/2017 1539   HGBUR MODERATE (A) 06/08/2017 1539   BILIRUBINUR NEGATIVE 06/08/2017 1539   KETONESUR NEGATIVE 06/08/2017 1539   PROTEINUR 30 (A) 06/08/2017 1539   UROBILINOGEN 0.2 07/29/2015 2231   NITRITE NEGATIVE 06/08/2017 1539   LEUKOCYTESUR LARGE (A) 06/08/2017 1539   Sepsis Labs: @LABRCNTIP (procalcitonin:4,lacticidven:4)  ) Recent Results (from the past 240 hour(s))  Urine Culture     Status: Abnormal   Collection Time: 06/08/17  3:39 PM  Result Value Ref Range Status   Specimen Description URINE, RANDOM  Final   Special Requests  NONE  Final   Culture >=100,000 COLONIES/mL KLEBSIELLA PNEUMONIAE (A)  Final   Report Status 06/11/2017 FINAL  Final   Organism ID, Bacteria KLEBSIELLA PNEUMONIAE (A)  Final      Susceptibility   Klebsiella pneumoniae - MIC*    AMPICILLIN >=32 RESISTANT Resistant     CEFAZOLIN <=4 SENSITIVE Sensitive     CEFTRIAXONE <=1 SENSITIVE Sensitive     CIPROFLOXACIN <=0.25 SENSITIVE Sensitive     GENTAMICIN <=1 SENSITIVE Sensitive     IMIPENEM <=0.25 SENSITIVE Sensitive     NITROFURANTOIN 64 INTERMEDIATE Intermediate     TRIMETH/SULFA <=20 SENSITIVE Sensitive     AMPICILLIN/SULBACTAM <=2 SENSITIVE Sensitive     PIP/TAZO <=4 SENSITIVE Sensitive     Extended ESBL NEGATIVE Sensitive     * >=100,000 COLONIES/mL KLEBSIELLA PNEUMONIAE  MRSA PCR Screening     Status: None   Collection Time: 06/09/17  9:15 AM  Result Value Ref Range Status   MRSA by PCR NEGATIVE NEGATIVE Final    Comment:        The GeneXpert MRSA Assay (FDA approved for NASAL specimens only), is one component of a comprehensive MRSA colonization surveillance program. It is not intended to diagnose MRSA infection nor to guide or monitor treatment for MRSA infections.          Radiology Studies: Dg Chest Port 1 View  Result Date: 06/12/2017 CLINICAL DATA:  Shortness of breath today EXAM: PORTABLE CHEST 1 VIEW COMPARISON:  06/08/2017 FINDINGS: Right PICC line tip:  SVC. Very large hiatal hernia with mixed density and lucency projecting over the cardiac shadow. Moderate enlargement of the cardiopericardial silhouette without edema. Old bilateral rib deformities from old fractures. Bony demineralization. Suspected thoracic kyphosis. The patient's chin obscures the medial lung apices bilaterally. No blunting of the costophrenic angles. Aside from presume passive atelectasis related to the hiatal hernia, the lungs appear clear. IMPRESSION: 1. Very large hiatal hernia, likely containing most of the stomach. 2. Moderate enlargement  of the cardiopericardial silhouette, without edema. 3. PICC line tip: SVC. 4. Bony demineralization with old bilateral healed rib fractures. Electronically Signed   By: Van Clines M.D.   On: 06/12/2017 10:07        Scheduled Meds: . calcium-vitamin D  1 tablet Oral Daily  . colestipol  2 g Oral BID  . cyanocobalamin  1,000 mcg Intramuscular Q30 days  .  enoxaparin (LOVENOX) injection  40 mg Subcutaneous Q24H  . feeding supplement  1 Container Oral TID BM  . magnesium oxide  800 mg Oral BID  . multivitamin with minerals  1 tablet Oral Daily  . pantoprazole  40 mg Oral BID  . saccharomyces boulardii  250 mg Oral Daily  . sodium bicarbonate  650 mg Oral BID  . sodium chloride flush  3 mL Intravenous Q12H   Continuous Infusions:    LOS: 5 days    Time spent: 35  Addendum At the end of rounds patient was reexamined and respiratory rate had 60 8 dropped and she came off of oxygen. Repeat labs a.m.  Verneita Griffes, MD [phone]   If 7PM-7AM, please contact night-coverage [email or web address] Password [facility] 06/13/2017, 4:28 PM

## 2017-06-13 NOTE — Progress Notes (Signed)
3 Days Post-Op   Subjective/Chief Complaint: Feeling much better this morning Denies SOB Reports minimal abdominal discomfort   Objective: Vital signs in last 24 hours: Temp:  [97.7 F (36.5 C)-98 F (36.7 C)] 98 F (36.7 C) (07/08 0551) Pulse Rate:  [82-103] 103 (07/08 0551) Resp:  [20-22] 22 (07/08 0551) BP: (105-113)/(56-65) 105/61 (07/08 0551) SpO2:  [98 %-100 %] 98 % (07/08 0551) Last BM Date: 06/12/17  Intake/Output from previous day: 07/07 0701 - 07/08 0700 In: 740 [P.O.:240; I.V.:500] Out: -  Intake/Output this shift: No intake/output data recorded.  Exam: Lungs clear Abdomen soft, non-tender  Lab Results:   Recent Labs  06/11/17 0419 06/12/17 0930  WBC 9.1 13.6*  HGB 8.5* 9.0*  HCT 25.8* 27.9*  PLT 137* 144*   BMET  Recent Labs  06/11/17 1822 06/12/17 0937  NA 142 140  K 4.2 4.2  CL 128* 126*  CO2 7* <7*  GLUCOSE 80 115*  BUN 6 6  CREATININE 0.95 0.93  CALCIUM 7.4* 7.6*   PT/INR No results for input(s): LABPROT, INR in the last 72 hours. ABG No results for input(s): PHART, HCO3 in the last 72 hours.  Invalid input(s): PCO2, PO2  Studies/Results: Dg Chest Port 1 View  Result Date: 06/12/2017 CLINICAL DATA:  Shortness of breath today EXAM: PORTABLE CHEST 1 VIEW COMPARISON:  06/08/2017 FINDINGS: Right PICC line tip:  SVC. Very large hiatal hernia with mixed density and lucency projecting over the cardiac shadow. Moderate enlargement of the cardiopericardial silhouette without edema. Old bilateral rib deformities from old fractures. Bony demineralization. Suspected thoracic kyphosis. The patient's chin obscures the medial lung apices bilaterally. No blunting of the costophrenic angles. Aside from presume passive atelectasis related to the hiatal hernia, the lungs appear clear. IMPRESSION: 1. Very large hiatal hernia, likely containing most of the stomach. 2. Moderate enlargement of the cardiopericardial silhouette, without edema. 3. PICC line  tip: SVC. 4. Bony demineralization with old bilateral healed rib fractures. Electronically Signed   By: Van Clines M.D.   On: 06/12/2017 10:07    Anti-infectives: Anti-infectives    Start     Dose/Rate Route Frequency Ordered Stop   06/08/17 2000  cefTRIAXone (ROCEPHIN) 1 g in dextrose 5 % 50 mL IVPB  Status:  Discontinued     1 g 100 mL/hr over 30 Minutes Intravenous Every 24 hours 06/08/17 1954 06/12/17 0954      Assessment/Plan: s/p Procedure(s): CANCELLED PROCEDURE  Large, symptomatic hiatal hernia  Needs surgical intervention.  We will be discussing with our partners who do this surgery who may be able to attempt it this week.  LOS: 5 days    Kyrie Fludd A 06/13/2017

## 2017-06-14 ENCOUNTER — Inpatient Hospital Stay (HOSPITAL_COMMUNITY): Payer: Medicare HMO

## 2017-06-14 LAB — CBC WITH DIFFERENTIAL/PLATELET
Basophils Absolute: 0 10*3/uL (ref 0.0–0.1)
Basophils Relative: 0 %
EOS ABS: 0.2 10*3/uL (ref 0.0–0.7)
EOS PCT: 3 %
HCT: 22.3 % — ABNORMAL LOW (ref 36.0–46.0)
Hemoglobin: 7.5 g/dL — ABNORMAL LOW (ref 12.0–15.0)
LYMPHS ABS: 3.7 10*3/uL (ref 0.7–4.0)
Lymphocytes Relative: 48 %
MCH: 26.9 pg (ref 26.0–34.0)
MCHC: 33.6 g/dL (ref 30.0–36.0)
MCV: 79.9 fL (ref 78.0–100.0)
MONOS PCT: 11 %
Monocytes Absolute: 0.8 10*3/uL (ref 0.1–1.0)
Neutro Abs: 3 10*3/uL (ref 1.7–7.7)
Neutrophils Relative %: 38 %
PLATELETS: 123 10*3/uL — AB (ref 150–400)
RBC: 2.79 MIL/uL — AB (ref 3.87–5.11)
RDW: 18.6 % — AB (ref 11.5–15.5)
WBC: 7.7 10*3/uL (ref 4.0–10.5)

## 2017-06-14 LAB — BASIC METABOLIC PANEL
Anion gap: 9 (ref 5–15)
BUN: 6 mg/dL (ref 6–20)
CALCIUM: 7.6 mg/dL — AB (ref 8.9–10.3)
CO2: 9 mmol/L — AB (ref 22–32)
CREATININE: 0.92 mg/dL (ref 0.44–1.00)
Chloride: 124 mmol/L — ABNORMAL HIGH (ref 101–111)
GFR calc non Af Amer: >60 mL/min (ref >60)
Glucose, Bld: 79 mg/dL (ref 65–99)
Potassium: 3 mmol/L — ABNORMAL LOW (ref 3.5–5.1)
Sodium: 142 mmol/L (ref 135–145)

## 2017-06-14 LAB — ABO/RH: ABO/RH(D): O POS

## 2017-06-14 LAB — PREALBUMIN: Prealbumin: 7.5 mg/dL — ABNORMAL LOW (ref 18–38)

## 2017-06-14 MED ORDER — MORPHINE SULFATE (PF) 4 MG/ML IV SOLN
0.5000 mg | INTRAVENOUS | Status: DC | PRN
  Administered 2017-06-14 – 2017-06-16 (×4): 0.52 mg via INTRAVENOUS
  Filled 2017-06-14 (×5): qty 1

## 2017-06-14 MED ORDER — IOPAMIDOL (ISOVUE-300) INJECTION 61%
INTRAVENOUS | Status: AC
  Filled 2017-06-14: qty 30

## 2017-06-14 MED ORDER — CAPSICUM OLEORESIN 0.025 % EX CREA
TOPICAL_CREAM | Freq: Three times a day (TID) | CUTANEOUS | Status: DC
  Administered 2017-06-14 (×2): via TOPICAL
  Filled 2017-06-14: qty 60

## 2017-06-14 MED ORDER — IOPAMIDOL (ISOVUE-300) INJECTION 61%
15.0000 mL | Freq: Once | INTRAVENOUS | Status: DC | PRN
  Administered 2017-06-14: 15 mL via ORAL
  Filled 2017-06-14: qty 30

## 2017-06-14 MED ORDER — POTASSIUM CHLORIDE CRYS ER 20 MEQ PO TBCR: 20.0000 meq | EXTENDED_RELEASE_TABLET | ORAL | Status: DC | PRN

## 2017-06-14 MED ORDER — IOPAMIDOL (ISOVUE-300) INJECTION 61%
100.0000 mL | Freq: Once | INTRAVENOUS | Status: AC | PRN
  Administered 2017-06-14: 100 mL via INTRAVENOUS

## 2017-06-14 MED ORDER — POTASSIUM CHLORIDE 10 MEQ/100ML IV SOLN
10.0000 meq | INTRAVENOUS | Status: AC
  Administered 2017-06-14 (×5): 10 meq via INTRAVENOUS
  Filled 2017-06-14 (×5): qty 100

## 2017-06-14 MED ORDER — ENSURE ENLIVE PO LIQD
237.0000 mL | Freq: Two times a day (BID) | ORAL | Status: DC
  Administered 2017-06-14: 237 mL via ORAL

## 2017-06-14 MED ORDER — FUROSEMIDE 40 MG PO TABS
40.0000 mg | ORAL_TABLET | Freq: Every day | ORAL | Status: DC
  Administered 2017-06-15: 40 mg via ORAL
  Filled 2017-06-14: qty 1

## 2017-06-14 MED ORDER — METHOCARBAMOL 1000 MG/10ML IJ SOLN
500.0000 mg | Freq: Three times a day (TID) | INTRAVENOUS | Status: DC | PRN
  Filled 2017-06-14: qty 5

## 2017-06-14 MED ORDER — SODIUM CHLORIDE 0.9 % IV SOLN: Freq: Once | INTRAVENOUS | Status: DC

## 2017-06-14 MED ORDER — IOPAMIDOL (ISOVUE-300) INJECTION 61%
INTRAVENOUS | Status: AC
  Filled 2017-06-14: qty 100

## 2017-06-14 NOTE — Care Management Note (Signed)
Case Management Note  Patient Details  Name: Danielle [name] MRN: [id number] Date of Birth: [birth year]  Subjective/Objective:    Re-admit 06/08/17 with cco poor po intake, nausea and vomiting                Action/Plan: Plan to discharge home   Expected Discharge Date:  06/15/17               Expected Discharge Plan:  [place]  In-House Referral:     Discharge planning Services  CM Consult  Post Acute Care Choice:    Choice offered to:     DME Arranged:    DME Agency:     HH Arranged:  RN, PT, Nurse's Aide [place] Agency:     Status of Service:  In process, will continue to follow  If discussed at Long Length of Stay Meetings, dates discussed:    Additional CommentsPurcell Mouton, RN 06/14/2017, 12:11 PM

## 2017-06-14 NOTE — Progress Notes (Signed)
Nutrition Follow Up Note   DOCUMENTATION CODES:   Severe malnutrition in context of chronic illness  INTERVENTION:   Recommend TPN if pt continues to have poor oral intake as pt has not been meeting estimated needs for several weeks.   Pt at high refeeding risk  Boost Breeze po TID, each supplement provides 250 kcal and 9 grams of protein  MVI  NUTRITION DIAGNOSIS:   Malnutrition (severe) related to chronic dysphagia as evidenced by severe depletion of body fat, severe depletion of muscle mass, 22 percent weight loss in 3 months  GOAL:   Patient will meet greater than or equal to 90% of their needs  MONITOR:   PO intake, Supplement acceptance, Labs, Weight trends  ASSESSMENT:   71 y.o. female with medical history significant of psoriasis, Crohn's disease (followed by GI, Dr. Laural Golden on remicaide infusion), hiatal hernia, GERD, CKD stage III, and H/O rectovaginal fistula; who presents with complaints of vomiting. Patient was just recently admitted into the hospital 5/31-6/8 sepsis secondary to urinary tract infection and possible Crohn's flare. She had just had a recent perirectal abscess I&D by Dr. Rush Farmer on 5/16. Since being home patient reports having poor oral appetite.   Met with pt in room today. Pt reports continued poor oral intake. Pt reports that she has not been eating any of her liquid diet; she will only drink Boost Breeze. Pt is afraid of getting sick. Pt s/p esophagogram 7/5 which found a large hiatal hernia. Per MD note, pt will likely need surgical intervention. PICC placed 7/5. Pt with volume overload. No new weight since 7/5. Pt would likely benefit from TPN to improve nutritional status prior to having surgery as pt has not eaten enough to meet estimated needs in several weeks. Pt continues to be at high refeeding risk.   Medications reviewed and include: Ca Vit D, colestipol, Vit B-12, lovenox, Mg oxide, protonix, MVI, florastor, Na Bicarb, KCl, morphine  Labs  reviewed: K 3.0(L), Cl 124(H), Ca 7.6(L),  Mg 1.5(L), P 3.4 wnl pre albumin 7.5(L) Hgb 7.5(L), Hct 22.3(L)  Diet Order:  Diet full liquid Room service appropriate? Yes; Fluid consistency: Thin  Skin:  Reviewed, no issues  Last BM:  7/9  Height:   Ht Readings from Last 1 Encounters:  06/10/17 5' 4"  (1.626 m)    Weight:   Wt Readings from Last 1 Encounters:  06/10/17 114 lb (51.7 kg)    Ideal Body Weight:  54.5 kg  BMI:  Body mass index is 19.57 kg/m.  Estimated Nutritional Needs:   Kcal:  1550-1750kcal/day   Protein:  78-88g/day   Fluid:  >1.5L/day   EDUCATION NEEDS:   No education needs identified at this time  Koleen Distance MS, RD, [place] Pager #[phone] After Hours Pager: [phone]

## 2017-06-14 NOTE — Progress Notes (Signed)
PROGRESS NOTE    Danielle [name]  GDJ:[id number] DOB: 12-Jul-1946 DOA: 06/08/2017 PCP: Iona Beard, MD  Outpatient Specialists:   [facility]    Brief Narrative:   64 ? Psoriasis Complicated Crohn's disease with perirectal abscesses  Status post I&D 04/21/2017 Dr. Ninfa Linden general surgery  In the past has refused diverting colostomy--supposed to see Dr. 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 Laren Everts has had dilationj ~ 1 year prior in 2017  On admission tells me that she has significant dysphagia and cannot swallow solid foods Only apparently has been able to drink milkshakes and liquids and keep these down with success She also has abdominal pain in the epigastrium as well as low back pain  Because of her persisting symptoms we tried to order on July 4 a he suffered gram which was not possible and subsequently consulted GI who felt that an endoscopy was necessary to rule out stricture Esophageogram 7/5 eventually done confirmed large hiatal hernia General surgery consulted for input regarding possible surgery for the same  Assessment & Plan:   Principal Problem:   Nausea and vomiting Active Problems:   Leukocytosis   Hypomagnesemia   Acute renal failure superimposed on stage 3 chronic kidney disease ([place])   Crohn disease ([place])   Hypokalemia   Acute lower UTI   Protein-calorie malnutrition, severe  Iatrogenic volume overload 06/12/2017-improving Acute hypoxic respiratory failure RR ?  Crackles on exam Was getting 100 cc/h--still 7 L positive  Stop IVF  Lasix 20 x 1 IV 7/7 and then to 1 7/8  We dose Lasix 40 mg X1 a.m. 7/9  Dysphagia  ? Stricture + Large  hiatal hernias  GI consulted, esophageal gram 06/09/2017 = large hiatal hernia  General surgery consulted 06/10/17-"??diaphragmatico abdominopexy [?]"   Gen surg will coordinate--- getting CT scan 7/9   Severe malnutrition in the context of chronic disease  High risk for refeeding as per dietitian  Total protein 5.8, albumin 2.4  Prealbumin 7  Nutritionist input noted-we will try in addition strawberry ensure and see we can optimize her as an outpatient  If she is able to keep down at least 2-3 shakes a day she may be discharged soon   Volume depletion secondary to or by mouth intake  -on admission creatinine was 1.6--->0.8 and has improved 7/8 Hypokalemia, hypomagnesemia 7/8--given -2 grams of magnesium 7/6 Chr Metabolic acidosis from vol depletion [chr diarrhea] on admit-metabolic acidosis is not improved Contraction alkalosis probably secondary to diarrhea---diarrhea seems to have resolved 7/8 PICC 7/5 for repletion and further lab draw  Severe hypokalemia-of 2.3 06/10/2017-replace potassium with saline D5   Replace potassium with IV 40 mEq today-potassium 3.0 today   Replace mag ox 800 twice a day 7/8-recheck 7/10   Increase NaCo3 650 twice a day-->1.3 bid times a day--improving very slowly   Asymptomatic bacteriuria--patient urinated in cup on admit  Urine culture 100,000 K pneumonia  Stop rocephin-not infectious  Acute complicated Crohn's disease Had fistula surgery -perineal + seton removed 2017 I&D  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 Laural Golden  Currently getting Remicade infusions as OP  Continue Lomotil  q 4 prn (refused medication overnight 7/8), Coletipol 2 tab bid  Back spasm/lumbago  Dc fentanyl and oxycodone  Trial and discontinued Robaxin 7/8--DC IV formulation  Have added when necessary low-dose morphine IV 7/5--pain is somewhat controlled  Adding capsaicin cream to  sacral area--have explained to patient she needs to get up and move otherwise she will continue to have back pain  scd D/w niece at bedside--will discuss with sister a.m. Appreciate GI input, Gen surg input--probably will need hiatal hernia repair prior to discharge Unclear dispo  Consultants:    Dr. Collene Mares 7/4  Gen surg 7/5 dr toth  Procedures:   None yet  Antimicrobials:    rocephin 7/3    Subjective:  Patient doing fair No nausea no vomiting Eating and drinking No chest pain Tolerating some clear liquids that is now nothing by mouth for CT scan  Objective: Vitals:   06/13/17 2055 06/14/17 0420 06/14/17 1327 06/14/17 1345  BP: (!) 91/54 (!) 91/47  (!) 90/48  Pulse: (!) 102 100  96  Resp: 18 18  18   Temp: 98.6 F (37 C) 98.4 F (36.9 C)  98.1 F (36.7 C)  TempSrc: Oral Oral  Oral  SpO2: 100% 100%  100%  Weight:   52.4 kg (115 lb 8.3 oz)   Height:        Intake/Output Summary (Last 24 hours) at 06/14/17 1705 Last data filed at 06/14/17 1600  Gross per 24 hour  Intake              640 ml  Output                0 ml  Net              640 ml   Filed Weights   06/08/17 1536 06/10/17 0835 06/14/17 1327  Weight: 51.7 kg (114 lb) 51.7 kg (114 lb) 52.4 kg (115 lb 8.3 oz)   Examination:  Edentulous pleasant EOMI NCAT Flat affect S1-S2 no murmur rub or gallop Abdomen soft nontender but some discomfort in epigastrium no organomegaly No lower extremity  Data Reviewed: I have personally reviewed following labs and imaging studies  CBC:  Recent Labs Lab 06/08/17 1543 06/11/17 0419 06/12/17 0930 06/14/17 0411  WBC 12.4* 9.1 13.6* 7.7  NEUTROABS  --  2.5  --  3.0  HGB 11.8* 8.5* 9.0* 7.5*  HCT 35.4* 25.8* 27.9* 22.3*  MCV 81.0 81.9 83.0 79.9  PLT 195 137* 144* 379*   Basic Metabolic Panel:  Recent Labs Lab 06/08/17 1543 06/10/17 0810  06/11/17 0419 06/11/17 1822 06/12/17 0937 06/13/17 1005 06/14/17 0411  NA 141 143  < > 140 142 140 143 142    K 2.8* 2.3*  < > 3.2* 4.2 4.2 3.3* 3.0*  CL 118* 123*  < > 126* 128* 126* 129* 124*  CO2 13* 8*  < > 7* 7* <7* 8* 9*  GLUCOSE 98 80  < > 90 80 115* 85 79  BUN 24* 13  < > 8 6 6 7 6   CREATININE 1.60* 1.30*  < > 1.11* 0.95 0.93 0.81 0.92  CALCIUM 7.6* 7.8*  < > 7.3* 7.4* 7.6* 7.8* 7.6*  MG 1.3* 1.6*  --  1.3*  --  1.6* 1.5*  --   PHOS  --   --   --  2.4*  --  2.5 3.4  --   < > = values in this interval not displayed. GFR: Estimated Creatinine Clearance: 47.7 mL/min (  by C-G formula based on SCr of 0.92 mg/dL). Liver Function Tests:  Recent Labs Lab 06/08/17 1543 06/10/17 0810 06/11/17 0419 06/12/17 0937 06/13/17 1005  AST 30 17 14* 21 23  ALT 21 13* 11* 15 16  ALKPHOS 128* 90 87 98 91  BILITOT 0.6 0.4 0.4 0.2* 0.1*  PROT 8.0 6.0* 5.8* 6.4* 5.8*  ALBUMIN 3.6 2.6* 2.4* 2.7* 2.5*    Recent Labs Lab 06/08/17 1543  LIPASE 52*   No results for input(s): AMMONIA in the last 168 hours. Coagulation Profile: No results for input(s): INR, PROTIME in the last 168 hours. Cardiac Enzymes: No results for input(s): CKTOTAL, CKMB, CKMBINDEX, TROPONINI in the last 168 hours. BNP (last 3 results) No results for input(s): PROBNP in the last 8760 hours. HbA1C: No results for input(s): HGBA1C in the last 72 hours. CBG: No results for input(s): GLUCAP in the last 168 hours. Lipid Profile: No results for input(s): CHOL, HDL, LDLCALC, TRIG, CHOLHDL, LDLDIRECT in the last 72 hours. Thyroid Function Tests: No results for input(s): TSH, T4TOTAL, FREET4, T3FREE, THYROIDAB in the last 72 hours. Anemia Panel: No results for input(s): VITAMINB12, FOLATE, FERRITIN, TIBC, IRON, RETICCTPCT in the last 72 hours. Urine analysis:    Component Value Date/Time   COLORURINE YELLOW 06/08/2017 1539   APPEARANCEUR CLOUDY (A) 06/08/2017 1539   LABSPEC 1.010 06/08/2017 1539   PHURINE 6.0 06/08/2017 1539   GLUCOSEU NEGATIVE 06/08/2017 1539   HGBUR MODERATE (A) 06/08/2017 1539   BILIRUBINUR NEGATIVE  06/08/2017 1539   KETONESUR NEGATIVE 06/08/2017 1539   PROTEINUR 30 (A) 06/08/2017 1539   UROBILINOGEN 0.2 07/29/2015 2231   NITRITE NEGATIVE 06/08/2017 1539   LEUKOCYTESUR LARGE (A) 06/08/2017 1539   Sepsis Labs: @LABRCNTIP (procalcitonin:4,lacticidven:4)  ) Recent Results (from the past 240 hour(s))  Urine Culture     Status: Abnormal   Collection Time: 06/08/17  3:39 PM  Result Value Ref Range Status   Specimen Description URINE, RANDOM  Final   Special Requests NONE  Final   Culture >=100,000 COLONIES/mL KLEBSIELLA PNEUMONIAE (A)  Final   Report Status 06/11/2017 FINAL  Final   Organism ID, Bacteria KLEBSIELLA PNEUMONIAE (A)  Final      Susceptibility   Klebsiella pneumoniae - MIC*    AMPICILLIN >=32 RESISTANT Resistant     CEFAZOLIN <=4 SENSITIVE Sensitive     CEFTRIAXONE <=1 SENSITIVE Sensitive     CIPROFLOXACIN <=0.25 SENSITIVE Sensitive     GENTAMICIN <=1 SENSITIVE Sensitive     IMIPENEM <=0.25 SENSITIVE Sensitive     NITROFURANTOIN 64 INTERMEDIATE Intermediate     TRIMETH/SULFA <=20 SENSITIVE Sensitive     AMPICILLIN/SULBACTAM <=2 SENSITIVE Sensitive     PIP/TAZO <=4 SENSITIVE Sensitive     Extended ESBL NEGATIVE Sensitive     * >=100,000 COLONIES/mL KLEBSIELLA PNEUMONIAE  MRSA PCR Screening     Status: None   Collection Time: 06/09/17  9:15 AM  Result Value Ref Range Status   MRSA by PCR NEGATIVE NEGATIVE Final    Comment:        The GeneXpert MRSA Assay (FDA approved for NASAL specimens only), is one component of a comprehensive MRSA colonization surveillance program. It is not intended to diagnose MRSA infection nor to guide or monitor treatment for MRSA infections.          Radiology Studies: No results found.      Scheduled Meds: . calcium-vitamin D  1 tablet Oral Daily  . capsicum oleoresin   Topical TID  . colestipol  2 g Oral BID  . cyanocobalamin  1,000 mcg Intramuscular Q30 days  . enoxaparin (LOVENOX) injection  40 mg Subcutaneous  Q24H  . feeding supplement  1 Container Oral TID BM  . feeding supplement (ENSURE ENLIVE)  237 mL Oral BID BM  . iopamidol      . iopamidol      . magnesium oxide  800 mg Oral BID  . multivitamin with minerals  1 tablet Oral Daily  . pantoprazole  40 mg Oral BID  . saccharomyces boulardii  250 mg Oral Daily  . sodium bicarbonate  1,300 mg Oral BID  . sodium chloride flush  3 mL Intravenous Q12H   Continuous Infusions: . sodium chloride    . methocarbamol (ROBAXIN)  IV    . potassium chloride 10 mEq (06/14/17 1650)     LOS: 6 days    Time spent: [place], MD [facility]   If 7PM-7AM, please contact night-coverage [email or web address] Password [email or web address] 06/14/2017, 5:05 PM

## 2017-06-14 NOTE — Progress Notes (Signed)
Patient ID: Danielle [name], female   DOB: [birth year], 71 y.o.   MRN: [id number]  [facility] Surgery Progress Note  4 Days Post-Op  Subjective: CC- dysphagia Sister at bedside. Main complaint today is back pain. Denies abdominal pain. Tolerating full liquids. Denies n/v.  Patient with volume overload over weekend causing acute hypoxic respiratory failure; responding to lasix. Denies SOB this morning.  Objective: Vital signs in last 24 hours: Temp:  [97.9 F (36.6 C)-98.6 F (37 C)] 98.4 F (36.9 C) (07/09 0420) Pulse Rate:  [99-102] 100 (07/09 0420) Resp:  [18] 18 (07/09 0420) BP: (91-98)/(47-58) 91/47 (07/09 0420) SpO2:  [100 %] 100 % (07/09 0420) Last BM Date: 06/14/17  Intake/Output from previous day: 07/08 0701 - 07/09 0700 In: 600 [P.O.:600] Out: 0  Intake/Output this shift: No intake/output data recorded.  PE: Gen:  Alert, NAD, pleasant HEENT: EOM's intact, pupils equal  Card:  RRR, no M/G/R heard Pulm:  CTAB, no W/R/R, effort normal Abd: well healed midline incision and previous colostomy site rlq, soft, ND, +BS, no masses or organomegaly. No abdominal tenderness Psych: A&Ox3  Skin: no rashes noted, warm and dry  Lab Results:   Recent Labs  06/12/17 0930 06/14/17 0411  WBC 13.6* 7.7  HGB 9.0* 7.5*  HCT 27.9* 22.3*  PLT 144* 123*   BMET  Recent Labs  06/13/17 1005 06/14/17 0411  NA 143 142  K 3.3* 3.0*  CL 129* 124*  CO2 8* 9*  GLUCOSE 85 79  BUN 7 6  CREATININE 0.81 0.92  CALCIUM 7.8* 7.6*   PT/INR No results for input(s): LABPROT, INR in the last 72 hours. CMP     Component Value Date/Time   NA 142 06/14/2017 0411   K 3.0 (L) 06/14/2017 0411   CL 124 (H) 06/14/2017 0411   CO2 9 (L) 06/14/2017 0411   GLUCOSE 79 06/14/2017 0411   BUN 6 06/14/2017 0411   CREATININE 0.92 06/14/2017 0411   CREATININE 1.37 (H) 03/01/2017 1149   CALCIUM 7.6 (L) 06/14/2017 0411   PROT 5.8 (L) 06/13/2017 1005   ALBUMIN 2.5 (L) 06/13/2017 1005   AST  23 06/13/2017 1005   ALT 16 06/13/2017 1005   ALKPHOS 91 06/13/2017 1005   BILITOT 0.1 (L) 06/13/2017 1005   GFRNONAA >60 06/14/2017 0411   GFRNONAA 42 (L) 11/15/2013 0934   GFRAA >60 06/14/2017 0411   GFRAA 49 (L) 11/15/2013 0934   Lipase     Component Value Date/Time   LIPASE 52 (H) 06/08/2017 1543       Studies/Results: Dg Chest Port 1 View  Result Date: 06/12/2017 CLINICAL DATA:  Shortness of breath today EXAM: PORTABLE CHEST 1 VIEW COMPARISON:  06/08/2017 FINDINGS: Right PICC line tip:  SVC. Very large hiatal hernia with mixed density and lucency projecting over the cardiac shadow. Moderate enlargement of the cardiopericardial silhouette without edema. Old bilateral rib deformities from old fractures. Bony demineralization. Suspected thoracic kyphosis. The patient's chin obscures the medial lung apices bilaterally. No blunting of the costophrenic angles. Aside from presume passive atelectasis related to the hiatal hernia, the lungs appear clear. IMPRESSION: 1. Very large hiatal hernia, likely containing most of the stomach. 2. Moderate enlargement of the cardiopericardial silhouette, without edema. 3. PICC line tip: SVC. 4. Bony demineralization with old bilateral healed rib fractures. Electronically Signed   By: Van Clines M.D.   On: 06/12/2017 10:07    Anti-infectives: Anti-infectives    Start     Dose/Rate Route Frequency Ordered  Stop   06/08/17 2000  cefTRIAXone (ROCEPHIN) 1 g in dextrose 5 % 50 mL IVPB  Status:  Discontinued     1 g 100 mL/hr over 30 Minutes Intravenous Every 24 hours 06/08/17 1954 06/12/17 0954       Assessment/Plan Crohn's disease - on Remicade, followed by Dr. Laural Golden GERD - controlled with pantoprazole Rectovaginal fistula AKI - resolved, Cr 0.92 Hypokalemia - replacement per medicine Severe malnutrition - Prealbumin 7.5 (7/9)  Dysphagia - h/o history of Schatzki's ring - EGD with balloon dilation 03/15/2017: moderate Schatzki ring,  large hiatal hernia Large hiatal hernia - abdominal surgical history: appendectomy, cholecystectomy, 2-3 small bowel resections by Dr. Tamala Julian ~2005, colostomy followed by colostomy takedown 2009by Dr. Anthony Sar - at least 3 weeks of severe dysphagia and inability to tolerate any solid food - DG esophagus 7/5 showed deviation of the mid to distal esophagus around large hiatal hernia, no fixed strictures  ID - rocephin 7/3>>7/7 VTE - SCDs, lovenox FEN - full liquids, Boost TID  Plan - CT abdomen/pelvis with contrast today for preoperative planning. Plan to discuss case with MD's to determine timing of procedure. Continue full liquids and TID Boost.   LOS: 6 days    Jerrye Beavers , [facility] Surgery 06/14/2017, 9:03 AM Pager: [phone] Consults: [phone] Mon-Fri 7:00 am-4:30 pm Sat-Sun 7:00 am-11:30 am

## 2017-06-14 NOTE — Care Management Important Message (Signed)
Important Message  Patient Details  Name: Danielle [name] MRN: [id number] Date of Birth: 09-01-1946   Medicare Important Message Given:  Yes    Kerin Salen 06/14/2017, 10:21 AMImportant Message  Patient Details  Name: Danielle [name] MRN: [id number] Date of Birth: Aug 13, 1946   Medicare Important Message Given:  Yes    Kerin Salen 06/14/2017, 10:21 AM

## 2017-06-14 NOTE — Consult Note (Signed)
   [facility] Inpatient Consult   02/13/7672  [name] 41/93/[birth year]902 [phone]   Patient screened for [place] [facility] program services. Went to bedside to offer and discuss [facility] Care Management.   Ms. Coreas pleasantly declined [place] Management follow up. She reports she has a Chiropractor that performs similar functions as [place] [facility].   Accepted [facility] Care Management brochure with contact information to call should she change her mind in the future.   Will make inpatient RNCM aware of above.    Marthenia Rolling, MSN-Ed, RN,BSN [facility] Liaison [phone]

## 2017-06-14 NOTE — Progress Notes (Signed)
PT Cancellation Note  Patient Details Name: Danielle [name] MRN: [id number] DOB: 13-Dec-1945   Cancelled Treatment:    Reason Eval/Treat Not Completed: Patient declined, no reason specified Pt adamantly refused.  RN reports pt may more motivated by her sister however she is not present.  Pt with admission to [facility] in May and PT was ordered however pt declined each time during that admission.   Karolyne Timmons,KATHrine E 06/14/2017, 11:42 AM Carmelia Bake, PT, DPT 06/14/2017 Pager: [phone]

## 2017-06-15 LAB — COMPREHENSIVE METABOLIC PANEL
ALT: 19 U/L (ref 14–54)
AST: 24 U/L (ref 15–41)
Albumin: 2.4 g/dL — ABNORMAL LOW (ref 3.5–5.0)
Alkaline Phosphatase: 91 U/L (ref 38–126)
Anion gap: 9 (ref 5–15)
BILIRUBIN TOTAL: 0.4 mg/dL (ref 0.3–1.2)
BUN: 5 mg/dL — AB (ref 6–20)
CO2: 7 mmol/L — ABNORMAL LOW (ref 22–32)
Calcium: 7.7 mg/dL — ABNORMAL LOW (ref 8.9–10.3)
Chloride: 123 mmol/L — ABNORMAL HIGH (ref 101–111)
Creatinine, Ser: 0.86 mg/dL (ref 0.44–1.00)
Glucose, Bld: 73 mg/dL (ref 65–99)
POTASSIUM: 3.4 mmol/L — AB (ref 3.5–5.1)
Sodium: 139 mmol/L (ref 135–145)
TOTAL PROTEIN: 5.6 g/dL — AB (ref 6.5–8.1)

## 2017-06-15 LAB — CBC WITH DIFFERENTIAL/PLATELET
BASOS PCT: 0 %
Basophils Absolute: 0 10*3/uL (ref 0.0–0.1)
EOS ABS: 0.2 10*3/uL (ref 0.0–0.7)
Eosinophils Relative: 3 %
HCT: 22.1 % — ABNORMAL LOW (ref 36.0–46.0)
HEMOGLOBIN: 7.3 g/dL — AB (ref 12.0–15.0)
Lymphocytes Relative: 59 %
Lymphs Abs: 4.6 10*3/uL — ABNORMAL HIGH (ref 0.7–4.0)
MCH: 26.4 pg (ref 26.0–34.0)
MCHC: 33 g/dL (ref 30.0–36.0)
MCV: 79.8 fL (ref 78.0–100.0)
MONO ABS: 0.9 10*3/uL (ref 0.1–1.0)
MONOS PCT: 12 %
NEUTROS PCT: 26 %
Neutro Abs: 2 10*3/uL (ref 1.7–7.7)
Platelets: 126 10*3/uL — ABNORMAL LOW (ref 150–400)
RBC: 2.77 MIL/uL — ABNORMAL LOW (ref 3.87–5.11)
RDW: 18.3 % — AB (ref 11.5–15.5)
WBC: 7.7 10*3/uL (ref 4.0–10.5)

## 2017-06-15 LAB — PHOSPHORUS: Phosphorus: 3.2 mg/dL (ref 2.5–4.6)

## 2017-06-15 LAB — MAGNESIUM: MAGNESIUM: 1.2 mg/dL — AB (ref 1.7–2.4)

## 2017-06-15 LAB — PREPARE RBC (CROSSMATCH)

## 2017-06-15 MED ORDER — MAGNESIUM SULFATE 2 GM/50ML IV SOLN
2.0000 g | Freq: Once | INTRAVENOUS | Status: AC
  Administered 2017-06-15: 2 g via INTRAVENOUS
  Filled 2017-06-15: qty 50

## 2017-06-15 MED ORDER — POTASSIUM CHLORIDE 10 MEQ/100ML IV SOLN
10.0000 meq | INTRAVENOUS | Status: AC
  Administered 2017-06-15: 10 meq via INTRAVENOUS
  Filled 2017-06-15 (×4): qty 100

## 2017-06-15 MED ORDER — SODIUM CHLORIDE 0.9 % IV SOLN
Freq: Once | INTRAVENOUS | Status: AC
  Administered 2017-06-15: 17:00:00 via INTRAVENOUS

## 2017-06-15 MED ORDER — CAPSICUM OLEORESIN 0.025 % EX CREA: TOPICAL_CREAM | Freq: Three times a day (TID) | CUTANEOUS | 0 refills | Status: DC

## 2017-06-15 MED ORDER — BOOST / RESOURCE BREEZE PO LIQD: 1.0000 | Freq: Three times a day (TID) | ORAL | 0 refills | Status: DC

## 2017-06-15 MED ORDER — FAT EMULSION 20 % IV EMUL
120.0000 mL | INTRAVENOUS | Status: AC
  Administered 2017-06-16: 120 mL via INTRAVENOUS
  Filled 2017-06-15: qty 250

## 2017-06-15 MED ORDER — LOPERAMIDE HCL 2 MG PO CAPS
4.0000 mg | ORAL_CAPSULE | Freq: Four times a day (QID) | ORAL | Status: DC
  Administered 2017-06-15 – 2017-06-17 (×8): 4 mg via ORAL
  Filled 2017-06-15 (×9): qty 2

## 2017-06-15 MED ORDER — POTASSIUM CHLORIDE 10 MEQ/100ML IV SOLN
10.0000 meq | INTRAVENOUS | Status: AC
  Administered 2017-06-16 (×3): 10 meq via INTRAVENOUS
  Filled 2017-06-15 (×3): qty 100

## 2017-06-15 MED ORDER — PANTOPRAZOLE SODIUM 40 MG PO TBEC: 40.0000 mg | DELAYED_RELEASE_TABLET | Freq: Every day | ORAL | Status: AC

## 2017-06-15 MED ORDER — TRACE MINERALS CR-CU-MN-SE-ZN 10-1000-500-60 MCG/ML IV SOLN
INTRAVENOUS | Status: AC
  Administered 2017-06-16: 05:00:00 via INTRAVENOUS
  Filled 2017-06-15: qty 840

## 2017-06-15 MED ORDER — INSULIN ASPART 100 UNIT/ML ~~LOC~~ SOLN
0.0000 [IU] | Freq: Four times a day (QID) | SUBCUTANEOUS | Status: DC
  Administered 2017-06-17: 1 [IU] via SUBCUTANEOUS

## 2017-06-15 MED ORDER — PANTOPRAZOLE SODIUM 40 MG PO TBEC
40.0000 mg | DELAYED_RELEASE_TABLET | Freq: Every day | ORAL | Status: DC
  Administered 2017-06-16 – 2017-06-17 (×2): 40 mg via ORAL
  Filled 2017-06-15 (×2): qty 1

## 2017-06-15 NOTE — Evaluation (Addendum)
Physical Therapy Evaluation Patient Details Name: Danielle [name] MRN: [id number] DOB: 30-Sep-1946 Today's Date: 06/15/2017   History of Present Illness  Pt is a 72 yo female admitted for nausea and vomiting; Esophageogram 7/5 eventually done confirmed large hiatal hernia.  Pt has a history of GERD, Crohn's disease, previous left lacunar infarction, incision and drainage of perirectal abscess  Clinical Impression  Pt admitted with above diagnosis. Pt currently with functional limitations due to the deficits listed below (see PT Problem List).  Pt will benefit from skilled PT to increase their independence and safety with mobility to allow discharge to the venue listed below.  Pt encouraged to ambulate and only agreeable to ambulate to doorway in room and then refused to sit in recliner end of session; pt requesting back to bed due to PRBCs planned today. Pt requesting more "peach tea" end of session; NT reports pt had been drinking Boost breeze and aware pt brought another.  Recommend HHPT and RW for home if pt is agreeable.     Follow Up Recommendations Home health PT;Supervision for mobility/OOB    Equipment Recommendations  Rolling walker with 5" wheels    Recommendations for Other Services       Precautions / Restrictions Precautions Precautions: Fall Restrictions Weight Bearing Restrictions: No      Mobility  Bed Mobility Overal bed mobility: Needs Assistance Bed Mobility: Supine to Sit;Sit to Supine     Supine to sit: Supervision;HOB elevated Sit to supine: Supervision;HOB elevated   General bed mobility comments: supervision for lines  Transfers Overall transfer level: Needs assistance Equipment used: None Transfers: Sit to/from [facility] Sit to Stand: Min guard Stand pivot transfers: Min guard       General transfer comment: min/guard for safety, pt utilized bed rail and armrests for self steadying/assist, declined  RW  Ambulation/Gait Ambulation/Gait assistance: Min guard Ambulation Distance (Feet): 14 Feet Assistive device: None Gait Pattern/deviations: Step-through pattern;Decreased stride length;Narrow base of support     General Gait Details: pt only agreeable to ambulate to doorway of her room, declined using RW, a little unsteady without UE support however no overt LOB  Stairs            Wheelchair Mobility    Modified Rankin (Stroke Patients Only)       Balance Overall balance assessment: Needs assistance         Standing balance support: No upper extremity supported Standing balance-Leahy Scale: Fair                               Pertinent Vitals/Pain Pain Assessment: No/denies pain  Pt with increased work of breathing end of ambulation however SPO2 98% on room air    Home Living Family/patient expects to be discharged to:: Private residence Living Arrangements: Other relatives (sister) Available Help at Discharge: Family;Available PRN/intermittently Type of Home: House Home Access: Stairs to enter   [employer] of Steps: 1 Home Layout: Multi-level Home Equipment: Shower seat;Bedside commode Additional Comments: information taken from previous admission, pt with little verbalizations during session    Prior Function Level of Independence: Independent with assistive device(s)         Comments: Pt used SPC when ambulating in community     Hand Dominance        Extremity/Trunk Assessment   Upper Extremity Assessment Upper Extremity Assessment: Generalized weakness    Lower Extremity Assessment Lower Extremity Assessment: Generalized weakness  Cervical / Trunk Assessment Cervical / Trunk Assessment: Kyphotic;Other exceptions Cervical / Trunk Exceptions: forward head posture  Communication   Communication: No difficulties  Cognition Arousal/Alertness: Awake/alert Behavior During Therapy: Flat affect;Agitated Overall  Cognitive Status: Within Functional Limits for tasks assessed                                 General Comments: pt prefers her own way and can become agitated if she does not get her way      General Comments      Exercises     Assessment/Plan    PT Assessment Patient needs continued PT services  PT Problem List Decreased strength;Decreased mobility;Decreased activity tolerance;Decreased knowledge of use of DME;Decreased balance       PT Treatment Interventions DME instruction;Gait training;Therapeutic exercise;Therapeutic activities;Patient/family education;Functional mobility training;Balance training    PT Goals (Current goals can be found in the Care Plan section)  Acute Rehab PT Goals PT Goal Formulation: With patient Time For Goal Achievement: 06/22/17 Potential to Achieve Goals: Good    Frequency Min 2X/week   Barriers to discharge        Co-evaluation               AM-PAC PT "6 Clicks" Daily Activity  Outcome Measure Difficulty turning over in bed (including adjusting bedclothes, sheets and blankets)?: A Little Difficulty moving from lying on back to sitting on the side of the bed? : A Little Difficulty sitting down on and standing up from a chair with arms (e.g., wheelchair, bedside commode, etc,.)?: A Lot Help needed moving to and from a bed to chair (including a wheelchair)?: A Little Help needed walking in hospital room?: A Little Help needed climbing 3-5 steps with a railing? : A Lot 6 Click Score: 16    End of Session   Activity Tolerance: Other (comment) (pt self limiting) Patient left: in bed;with call bell/phone within reach;with bed alarm set Nurse Communication: Mobility status PT Visit Diagnosis: Difficulty in walking, not elsewhere classified (R26.2)    Time: 2979-8921 PT Time Calculation (min) (ACUTE ONLY): 12 min   Charges:   PT Evaluation $PT Eval Low Complexity: 1 Procedure     PT G CodesCarmelia Bake,  PT, DPT 06/15/2017 Pager: [phone]   York Ram E 06/15/2017, 1:23 PM

## 2017-06-15 NOTE — Progress Notes (Signed)
Pt selected [facility] for HHRN/PT.

## 2017-06-15 NOTE — Progress Notes (Addendum)
PHARMACY - ADULT TOTAL PARENTERAL NUTRITION CONSULT NOTE   Pharmacy Consult for TPN Indication: poor oral intake, severe dysphagia  Patient Measurements: Height: 5\' 4"  (162.6 cm) Weight: 115 lb 8.3 oz (52.4 kg) IBW/kg (Calculated) : 54.7 TPN AdjBW (KG): 51.7 Body mass index is 19.83 kg/m. Usual Weight:   Insulin Requirements:   Current Nutrition: full liquid diet, intolerant  IVF: none  Central access: PICC placed 7/5 TPN start date: 7/10  ASSESSMENT     Potassium chloride 10 meq IV x 4                                                                                                        HPI: Pt with extensive GI history. Pt unable to eat due  to dysphagia. Per RD consult pt at high risk for refeeding syndrome.  Significant events:   Today:    Glucose - no checks  Electrolytes - K+ 3.4 CorrCa WNL, Mg 1/2, replenish by MD   Renal - Scr 0.86  LFTs - WNL  TGs -  Prealbumin - 7.5  NUTRITIONAL GOALS                                                                                             RD recs: Kcal:  1550-1750kcal/day   Protein:  78-88g/day   Fluid:  >1.5L/day    Clinimix E  5/15 at a goal rate of 70 ml/hr + 20% fat emulsion at 20 ml/hr to provide: 84 g/day protein, 1672 Kcal/day.  PLAN                                                                                                                         At 1800 today:  Start Clinimix E 5/15 at 35 ml/hr.  20% fat emulsion at 5 ml/hr.  Plan to advance as tolerated to the goal rate.  TPN to contain standard multivitamins and trace elements.  Add sensitive SSI q6h  .   TPN lab panels on Mondays & Thursdays.  F/u daily.  Royetta Asal, PharmD, BCPS Pager [phone] 06/15/2017 5:43 PM

## 2017-06-15 NOTE — Discharge Summary (Signed)
Physician Discharge Summary  Danielle [name] TOI:[id number] DOB: January 15, 1946 DOA: 06/08/2017  PCP: Iona Beard, MD  Admit date: 06/08/2017 Discharge date: 06/15/2017  Time spent: 50 minutes  Recommendations for Outpatient Follow-up:  1. Patient will need care coordination between general surgery and gastroenterology as an out patient-we will cc Dr. Hassell Done general surgeon who was see the patient in consult for possible repair of large hiatal hernia after about one or 2 weeks once patient has received enough nutrition as well as TPN via PICC line 2. Patient will need a prealbumin as well as micronutrients and Chem-12 in addition to (magnesium phosphorus calcium) in the next 4 days at outpatient physician's office 3. We have discontinued for sake of high pill burden multiple oral supplements 4. We have prescribed boost breeze for the patient for nutrition in addition to her TPN 5. She should go home on a soft diet 6. She will need a CBC in about one week as well as she was transfused on 7/10  7. Rolling walker ordered for discharge in addition to home health PT and RN for infusion of TPN  Discharge Diagnoses:  Principal Problem:   Nausea and vomiting Active Problems:   Leukocytosis   Hypomagnesemia   Acute renal failure superimposed on stage 3 chronic kidney disease (HCC)   Crohn disease (HCC)   Hypokalemia   Acute lower UTI   Protein-calorie malnutrition, severe   Discharge Condition: Improved  Diet recommendation: Soft liquid diet if needed can try soft foods as tolerable  Filed Weights   06/08/17 1536 06/10/17 0835 06/14/17 1327  Weight: 51.7 kg (114 lb) 51.7 kg (114 lb) 52.4 kg (115 lb 8.3 oz)    History of present illness:  83 ? Psoriasis Complicated Crohn's disease with perirectal abscesses             Status post I&D 04/21/2017 Dr. Ninfa Linden general surgery             In the past has refused diverting colostomy--supposed to see Dr. Philip Aspen of general surgery after  last admission 05/06/2017--05/14/2017 and did not make office appointment  Re-admit 7/3 with poor po intake Relates has ptylasim but in addition cannot keeop "any solid down" since 6 days PTA Only drinking water and milk shakes  Has had eso dilatation  ~ 2 mo ago-Dr. Laren Everts has had dilationj ~ 1 year prior in 2017  On admission tells me that she has significant dysphagia and cannot swallow solid foods Only apparently has been able to drink milkshakes and liquids and keep these down with success She also has abdominal pain in the epigastrium as well as low back pain  Because of her persisting symptoms we tried to order on July 4 a he suffered gram which was not possible and subsequently consulted GI who felt that an endoscopy was necessary to rule out stricture Esophageogram 7/5 eventually done confirmed large hiatal hernia General surgery consulted for input regarding possible surgery for the same   Hospital Course:  Iatrogenic volume overload 06/12/2017-improving Acute hypoxic respiratory failure RR ?  Crackles on exam Was getting 100 cc/h--still 7 L positive             Stop IVF             Lasix 20 x 1 IV 7/7 and then to 1 7/8             We dose Lasix 40 mg X1 a.m. 7/9  On discharge we will aim to  keep volume stable and not discharged on daily Lasix-discharging physician to determine if would benefit from Lasix 40 daily VS every other day  Probable dilutional anemia  No dark or tarry stool  Patient has mainly been drinking liquids and has microcytic anemia  We will obtain iron studies but this should not deter discharge on 711  She has been transfused 2 units packed red cells and will need a CBC soon  Dysphagia  ? Stricture + Large hiatal hernias             GI consulted, esophageal gram 06/09/2017 = large hiatal hernia             General surgery consulted 06/10/17-"??diaphragmatico abdominopexy [?]"              Gen surg has recommended that the patient  follow up in the outpatient setting for discussion of surgery once patient has received enough nutrition via TPN              Severe malnutrition in the context of chronic disease  Chronic metabolic acidosis probably from diarrhea             High risk for refeeding as per dietitian             Total protein 5.8, albumin 2.4             Prealbumin 7             Nutritionist input noted-initially reluctant to use TPN however now no other real choice as patient not tolerating   strawberry insure and having intolerance to milk making it difficult to give her enteral nutrition  She will need LFTs in the next week to ensure no steatosis as well as micronutrients  Note that we have discontinued multiple medications such as her calcium, her iron, her magnesium, her bicarbonate because she is unable to take the majority of them by mouth--TPN should replace most of them however labs in the near future are needed to determine what other supplements she will need  Volume depletion secondary to or by mouth intake             -on admission creatinine was 1.6--->0.8 and has improved 7/8 Hypokalemia, hypomagnesemia 7/8--given -2 grams of magnesium 7/6 Chr Metabolic acidosis from vol depletion [chr diarrhea] on admit-metabolic acidosis is not improved and stable    Patient refusing to take Imodium  Contraction alkalosis probably secondary to diarrhea PICC 7/5 for repletion and further lab draw             Severe hypokalemia-of 2.3 06/10/2017-replace potassium with saline D5                          last potassium was 3.4, last magnesium was 1.2 and last phosphorus was 3.2   Pharmacy adjusting and we will start TPN as of 7/10               Asymptomatic bacteriuria--patient urinated in cup on admit             Urine culture 100,000 K pneumonia             Stop rocephin-not infectious  Acute complicated Crohn's disease Had fistula surgery -perineal + seton removed 2017 I&D  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 Laural Golden  Currently getting Remicade infusions as OP I will defer to her gastroenterologist how to progress with this -             Continue Lomotil q 4 prn-although she is refusing this important for her to take as she will lose micronutrients and  magnesium this way , did discontinue on discharge Coletipol 2 tab bid  Back spasm/lumbago             Dc fentanyl and oxycodone             Trial and discontinued Robaxin 7/8--DC IV formulation             Have added when necessary low-dose morphine IV 7/5--pain is somewhat controlled             Adding capsaicin cream to sacral area  scd D/w niece at bedside--will discuss with sister a.m. Appreciate GI input, Gen surg input--probably will need hiatal hernia repair prior to discharge Unclear dispo  Consultants:    Dr. Collene Mares 7/4  Gen surg 7/5 dr toth  Procedures:   None yet  Antimicrobials:    rocephin 7/3    Consultants:    Dr. Collene Mares 7/4  Gen surg 7/5 dr toth  Procedures:   None yet  Antimicrobials:    rocephin 7/3   Consultants:    Dr. Collene Mares 7/4  Gen surg 7/5 dr toth  Procedures:   None yet  Antimicrobials:    rocephin 7/3   Discharge Exam: Vitals:   06/15/17 1650 06/15/17 1710  BP: (!) 96/54 92/62  Pulse: 87 92  Resp: 18 (!) 22  Temp: 98 F (36.7 C) 98.1 F (36.7 C)   eomi no pallor icterus cta b abd soft  Flat affect s1 s2 no m/r/g abd soft-no tenderness No edema  Discharge Instructions    Current Discharge Medication List    CONTINUE these medications which have NOT CHANGED   Details  acetaminophen (TYLENOL) 325 MG tablet Take 2 tablets (650 mg total) by mouth every 6 (six) hours as needed for mild pain, fever or headache. Qty: 30 tablet, Refills: 0    albuterol (PROVENTIL HFA;VENTOLIN HFA) 108 (90 Base) MCG/ACT inhaler Inhale 2 puffs into the lungs every  6 (six) hours as needed for wheezing. Qty: 1 Inhaler, Refills: 2    Calcium Carbonate-Vitamin D (CALCIUM 600+D) 600-400 MG-UNIT tablet Take 1 tablet by mouth daily.    colestipol (COLESTID) 1 g tablet Take 2 tablets (2 g total) by mouth 2 (two) times daily. Qty: 60 tablet, Refills: 0    cyanocobalamin (,VITAMIN B-12,) 1000 MCG/ML injection Inject 1,000 mcg into the muscle every 30 (thirty) days.    dicyclomine (BENTYL) 10 MG/5ML syrup Take 20 mg by mouth 3 (three) times daily as needed (for GI upset).    diphenoxylate-atropine (LOMOTIL) 2.5-0.025 MG tablet TAKE 1 TABLET BY MOUTH 4 TIMES A DAY AS NEEDED FOR DIARRHEA/LOOSE STOOLS Qty: 90 tablet, Refills: 0    furosemide (LASIX) 40 MG tablet Take 1 tablet (40 mg total) by mouth daily as needed for edema. Qty: 30 tablet, Refills: 1    HYDROcodone-acetaminophen (NORCO) 7.5-325 MG tablet Take 1 tablet by mouth every 12 (twelve) hours as needed for moderate pain.    Hydrocortisone (GERHARDT'S BUTT CREAM) CREA Apply 1 application topically 4 (four) times daily as needed for irritation.    inFLIXimab (REMICADE) 100 MG injection Inject 100 mg into the vein every 8 (eight) weeks.    Multiple Vitamin (MULTIVITAMIN WITH MINERALS) TABS  Take 1 tablet by mouth daily.    nystatin (NYSTATIN) powder Apply 1 g topically 2 (two) times daily as needed (for itching/irritation).    potassium chloride SA (K-DUR,KLOR-CON) 20 MEQ tablet Take 20 mEq by mouth 3 (three) times daily.    promethazine (PHENERGAN) 25 MG tablet TAKE 1 TABLET (25 MG TOTAL) BY MOUTH 2 (TWO) TIMES DAILY AS NEEDED FOR NAUSEA OR VOMITING. Qty: 30 tablet, Refills: 0    saccharomyces boulardii (FLORASTOR) 250 MG capsule Take 250 mg by mouth daily.    sodium bicarbonate 650 MG tablet Take 1 tablet (650 mg total) by mouth 2 (two) times daily. Qty: 10 tablet, Refills: 0    traMADol (ULTRAM) 50 MG tablet Take 1 tablet (50 mg total) by mouth every 6 (six) hours as needed for moderate  pain. Qty: 15 tablet, Refills: 0    Vitamin D, Ergocalciferol, (DRISDOL) 50000 units CAPS capsule Take 1 capsule (50,000 Units total) by mouth every 7 (seven) days. Qty: 24 capsule, Refills: 0    ferrous sulfate 325 (65 FE) MG tablet Take 1 tablet (325 mg total) by mouth 3 (three) times daily with meals. Qty: 90 tablet, Refills: 3    fluconazole (DIFLUCAN) 150 MG tablet TAKE 1 TABLET BY MOUTH EVERY DAY Qty: 10 tablet, Refills: 1    loperamide (IMODIUM) 2 MG capsule Take 2 capsules (4 mg total) by mouth every 6 (six) hours. Qty: 30 capsule, Refills: 0    magic mouthwash w/lidocaine SOLN Take 5 mLs by mouth 4 (four) times daily. Qty: 30 mL, Refills: 0    pantoprazole (PROTONIX) 40 MG tablet TAKE 1 TABLET (40 MG TOTAL) BY MOUTH 2 (TWO) TIMES DAILY BEFORE A MEAL. Qty: 180 tablet, Refills: 3    predniSONE (DELTASONE) 20 MG tablet Take 2 tablets (40 mg total) by mouth daily with breakfast. Qty: 30 tablet, Refills: 0    zinc oxide 20 % ointment Apply 1 application topically 2 (two) times daily. Qty: 56.7 g, Refills: 0       Allergies  Allergen Reactions  . Zithromax [Azithromycin] Swelling and Other (See Comments)    Reaction:  Mouth swelling and blisters   . Aspirin Other (See Comments)    Reaction:  Stomach cramping   . Ibuprofen Other (See Comments)    Pt states that she prefers not to take this medication.   . Sulfa Antibiotics Nausea And Vomiting      The results of significant diagnostics from this hospitalization (including imaging, microbiology, ancillary and laboratory) are listed below for reference.    Significant Diagnostic Studies: Ct Abdomen Pelvis W Contrast  Result Date: 06/14/2017 CLINICAL DATA:  Crohn disease and rectovaginal fistula. EXAM: CT ABDOMEN AND PELVIS WITH CONTRAST TECHNIQUE: Multidetector CT imaging of the abdomen and pelvis was performed using the standard protocol following bolus administration of intravenous contrast. CONTRAST:  53mL ISOVUE-300  IOPAMIDOL (ISOVUE-300) INJECTION 61%, 123mL ISOVUE-300 IOPAMIDOL (ISOVUE-300) INJECTION 61% COMPARISON:  02/03/2017 FINDINGS: Lower chest: Very large hiatal hernia with nearly the entire stomach in the chest. Segment of transverse colon also extends up into the hernia. Hepatobiliary: Small area of low attenuation in the anterior liver, adjacent to the falciform ligament, is in a characteristic location for focal fatty deposition. Gallbladder surgically absent. Stable prominence extrahepatic common duct with no dilatation of the common bile duct in the head of the pancreas. Pancreas: No focal mass lesion. No dilatation of the main duct. No intraparenchymal cyst. No peripancreatic edema. Spleen: No splenomegaly. No focal mass lesion. Adrenals/Urinary  Tract: No adrenal nodule or mass. No enhancing lesion in either kidney. Kidneys are not excreting contrast by the 2 minutes renal delayed sequence raising the question of renal dysfunction. No evidence for hydroureter. Bladder is decompressed. Stomach/Bowel: As above, nearly the entire stomach is contained within a hiatal hernia. Duodenum is normally positioned as is the ligament of Treitz. No small bowel wall thickening. No small bowel dilatation. Small bowel appears foreshortened suggesting prior resection. Patient is status post right hemicolectomy. The actual ileocolic anastomosis may be in the hiatal hernia, but the bowel in the hernia so collapsed, this is difficult to assess. Sigmoid colon is draped across what appears to be the uterus (see below). The inflammation in the perianal region is similar to prior. Vascular/Lymphatic: No abdominal aortic aneurysm. There is no gastrohepatic or hepatoduodenal ligament lymphadenopathy. No intraperitoneal or retroperitoneal lymphadenopathy. No pelvic sidewall lymphadenopathy. Reproductive: There is more soft tissue cranial to the vagina than expected in the setting of hysterectomy. As such, and given that no surgical history  of hysterectomy is identified in EPIC, this soft tissue is presumably the body and fundus of the uterus. The sigmoid colon is draped across the left aspect of the presumed fundus but there is no wall thickening in this segment of the colon. No direct contact between colon or small bowel and the vagina is evident. Other: No intraperitoneal free fluid. Musculoskeletal: Bone windows reveal no worrisome lytic or sclerotic osseous lesions. Mild compression deformity noted at the level of L1. IMPRESSION: 1. Similar perianal stranding and inflammation with potential tiny fistulous tract between the anus and the skin. This edema/inflammation also tracks centrally towards the vaginal introitus. 2. Very large hiatal hernia containing most of the stomach, the ileocolic anastomosis, and proximal transverse colon. No evidence for obstruction. 3. Stable appearance compression deformity at L1. Electronically Signed   By: Misty Stanley M.D.   On: 06/14/2017 20:39   Dg Esophagus  Result Date: 06/10/2017 CLINICAL DATA:  Dysphagia EXAM: ESOPHOGRAM/BARIUM SWALLOW TECHNIQUE: Single contrast examination was performed using  thin barium. FLUOROSCOPY TIME:  Fluoroscopy Time:  1 minutes 6 seconds Radiation Exposure Index (if provided by the fluoroscopic device): 2.9 mGy Number of Acquired Spot Images: 0 COMPARISON:  CT chest 05/08/2017 FINDINGS: Limited study as the patient would only take small sips, min refused to drink more prior to completing the study. There is a large hiatal hernia. The esophagus deviates to the right around the large hiatal hernia. There are no fixed esophageal strictures or visible masses. IMPRESSION: Limited study as above. Deviation of the mid to distal esophagus around a large hiatal hernia. No fixed strictures. Electronically Signed   By: Rolm Baptise M.D.   On: 06/10/2017 11:01   Dg Chest Port 1 View  Result Date: 06/12/2017 CLINICAL DATA:  Shortness of breath today EXAM: PORTABLE CHEST 1 VIEW COMPARISON:   06/08/2017 FINDINGS: Right PICC line tip:  SVC. Very large hiatal hernia with mixed density and lucency projecting over the cardiac shadow. Moderate enlargement of the cardiopericardial silhouette without edema. Old bilateral rib deformities from old fractures. Bony demineralization. Suspected thoracic kyphosis. The patient's chin obscures the medial lung apices bilaterally. No blunting of the costophrenic angles. Aside from presume passive atelectasis related to the hiatal hernia, the lungs appear clear. IMPRESSION: 1. Very large hiatal hernia, likely containing most of the stomach. 2. Moderate enlargement of the cardiopericardial silhouette, without edema. 3. PICC line tip: SVC. 4. Bony demineralization with old bilateral healed rib fractures. Electronically Signed  By: Van Clines M.D.   On: 06/12/2017 10:07   Dg Abd Acute W/chest  Result Date: 06/08/2017 CLINICAL DATA:  Nausea, vomiting and diarrhea EXAM: DG ABDOMEN ACUTE W/ 1V CHEST COMPARISON:  Chest CT from 05/08/2017 FINDINGS: Previously noted herniation of stomach and portions of small bowel are again identified projecting over the top normal size cardiac silhouette. The lungs are clear. Old right-sided rib fractures involving the right fourth through eighth ribs are noted. The patient is status post cholecystectomy. The bowel gas pattern is unremarkable. Several sutures are also seen in the right mid abdomen. No free air or bowel obstruction is identified. IMPRESSION: 1. Large hiatal hernia again noted projecting over the cardiac and mediastinal contours. 2. No bowel obstruction. 3. No acute cardiopulmonary disease. Electronically Signed   By: Ashley Royalty M.D.   On: 06/08/2017 17:27    Microbiology: Recent Results (from the past 240 hour(s))  Urine Culture     Status: Abnormal   Collection Time: 06/08/17  3:39 PM  Result Value Ref Range Status   Specimen Description URINE, RANDOM  Final   Special Requests NONE  Final   Culture  >=100,000 COLONIES/mL KLEBSIELLA PNEUMONIAE (A)  Final   Report Status 06/11/2017 FINAL  Final   Organism ID, Bacteria KLEBSIELLA PNEUMONIAE (A)  Final      Susceptibility   Klebsiella pneumoniae - MIC*    AMPICILLIN >=32 RESISTANT Resistant     CEFAZOLIN <=4 SENSITIVE Sensitive     CEFTRIAXONE <=1 SENSITIVE Sensitive     CIPROFLOXACIN <=0.25 SENSITIVE Sensitive     GENTAMICIN <=1 SENSITIVE Sensitive     IMIPENEM <=0.25 SENSITIVE Sensitive     NITROFURANTOIN 64 INTERMEDIATE Intermediate     TRIMETH/SULFA <=20 SENSITIVE Sensitive     AMPICILLIN/SULBACTAM <=2 SENSITIVE Sensitive     PIP/TAZO <=4 SENSITIVE Sensitive     Extended ESBL NEGATIVE Sensitive     * >=100,000 COLONIES/mL KLEBSIELLA PNEUMONIAE  MRSA PCR Screening     Status: None   Collection Time: 06/09/17  9:15 AM  Result Value Ref Range Status   MRSA by PCR NEGATIVE NEGATIVE Final    Comment:        The GeneXpert MRSA Assay (FDA approved for NASAL specimens only), is one component of a comprehensive MRSA colonization surveillance program. It is not intended to diagnose MRSA infection nor to guide or monitor treatment for MRSA infections.      Labs: Basic Metabolic Panel:  Recent Labs Lab 06/10/17 0810  06/11/17 0419 06/11/17 1822 06/12/17 0937 06/13/17 1005 06/14/17 0411 06/15/17 0412  NA 143  < > 140 142 140 143 142 139  K 2.3*  < > 3.2* 4.2 4.2 3.3* 3.0* 3.4*  CL 123*  < > 126* 128* 126* 129* 124* 123*  CO2 8*  < > 7* 7* <7* 8* 9* 7*  GLUCOSE 80  < > 90 80 115* 85 79 73  BUN 13  < > 8 6 6 7 6  5*  CREATININE 1.30*  < > 1.11* 0.95 0.93 0.81 0.92 0.86  CALCIUM 7.8*  < > 7.3* 7.4* 7.6* 7.8* 7.6* 7.7*  MG 1.6*  --  1.3*  --  1.6* 1.5*  --  1.2*  PHOS  --   --  2.4*  --  2.5 3.4  --  3.2  < > = values in this interval not displayed. Liver Function Tests:  Recent Labs Lab 06/10/17 0810 06/11/17 0419 06/12/17 0937 06/13/17 1005 06/15/17 0412  AST 17  14* 21 23 24   ALT 13* 11* 15 16 19   ALKPHOS 90  87 98 91 91  BILITOT 0.4 0.4 0.2* 0.1* 0.4  PROT 6.0* 5.8* 6.4* 5.8* 5.6*  ALBUMIN 2.6* 2.4* 2.7* 2.5* 2.4*   No results for input(s): LIPASE, AMYLASE in the last 168 hours. No results for input(s): AMMONIA in the last 168 hours. CBC:  Recent Labs Lab 06/11/17 0419 06/12/17 0930 06/14/17 0411 06/15/17 0829  WBC 9.1 13.6* 7.7 7.7  NEUTROABS 2.5  --  3.0 2.0  HGB 8.5* 9.0* 7.5* 7.3*  HCT 25.8* 27.9* 22.3* 22.1*  MCV 81.9 83.0 79.9 79.8  PLT 137* 144* 123* 126*   Cardiac Enzymes: No results for input(s): CKTOTAL, CKMB, CKMBINDEX, TROPONINI in the last 168 hours. BNP: BNP (last 3 results)  Recent Labs  05/07/17 0024  BNP 49.3    ProBNP (last 3 results) No results for input(s): PROBNP in the last 8760 hours.  CBG: No results for input(s): GLUCAP in the last 168 hours.     SignedNita Sells MD   [facility] 06/15/2017, 5:29 PM

## 2017-06-15 NOTE — Progress Notes (Signed)
Patient ID: Danielle [name], female   DOB: 10/27/1946, 71 y.o.   MRN: [id number]  [facility] Surgery Progress Note  5 Days Post-Op  Subjective: CC- dysphagie Patient states that she wants to go home. She denies any current abdominal pain, nausea, or vomiting. She is on a full liquid diet but did not take anything in yesterday. States that she is just tired of everything and wants to go home. Not sure now if she would want to go through another surgery or not.  Objective: Vital signs in last 24 hours: Temp:  [97.6 F (36.4 C)-98.4 F (36.9 C)] 98.4 F (36.9 C) (07/10 0531) Pulse Rate:  [86-96] 87 (07/10 0531) Resp:  [18-22] 20 (07/10 0531) BP: (89-93)/(48-65) 89/53 (07/10 0531) SpO2:  [100 %] 100 % (07/10 0531) Weight:  [115 lb 8.3 oz (52.4 kg)] 115 lb 8.3 oz (52.4 kg) (07/09 1327) Last BM Date: 06/14/17  Intake/Output from previous day: 07/09 0701 - 07/10 0700 In: 410 [I.V.:10; IV Piggyback:400] Out: 0  Intake/Output this shift: No intake/output data recorded.  PE: Gen: Alert, NAD, pleasant HEENT: EOM's intact, pupils equal  Card: RRR, no M/G/R heard Pulm: CTAB, no W/R/R, effort normal Abd: well healed midline incision and previous colostomy site rlq, soft, ND, +BS, no masses or organomegaly. No abdominal tenderness Psych: A&Ox3  Skin: no rashes noted, warm and dry   Lab Results:   Recent Labs  06/14/17 0411 06/15/17 0829  WBC 7.7 7.7  HGB 7.5* 7.3*  HCT 22.3* 22.1*  PLT 123* 126*   BMET  Recent Labs  06/14/17 0411 06/15/17 0412  NA 142 139  K 3.0* 3.4*  CL 124* 123*  CO2 9* 7*  GLUCOSE 79 73  BUN 6 5*  CREATININE 0.92 0.86  CALCIUM 7.6* 7.7*   PT/INR No results for input(s): LABPROT, INR in the last 72 hours. CMP     Component Value Date/Time   NA 139 06/15/2017 0412   K 3.4 (L) 06/15/2017 0412   CL 123 (H) 06/15/2017 0412   CO2 7 (L) 06/15/2017 0412   GLUCOSE 73 06/15/2017 0412   BUN 5 (L) 06/15/2017 0412   CREATININE 0.86  06/15/2017 0412   CREATININE 1.37 (H) 03/01/2017 1149   CALCIUM 7.7 (L) 06/15/2017 0412   PROT 5.6 (L) 06/15/2017 0412   ALBUMIN 2.4 (L) 06/15/2017 0412   AST 24 06/15/2017 0412   ALT 19 06/15/2017 0412   ALKPHOS 91 06/15/2017 0412   BILITOT 0.4 06/15/2017 0412   GFRNONAA >60 06/15/2017 0412   GFRNONAA 42 (L) 11/15/2013 0934   GFRAA >60 06/15/2017 0412   GFRAA 49 (L) 11/15/2013 0934   Lipase     Component Value Date/Time   LIPASE 52 (H) 06/08/2017 1543       Studies/Results: Ct Abdomen Pelvis W Contrast  Result Date: 06/14/2017 CLINICAL DATA:  Crohn disease and rectovaginal fistula. EXAM: CT ABDOMEN AND PELVIS WITH CONTRAST TECHNIQUE: Multidetector CT imaging of the abdomen and pelvis was performed using the standard protocol following bolus administration of intravenous contrast. CONTRAST:  67mL ISOVUE-300 IOPAMIDOL (ISOVUE-300) INJECTION 61%, 180mL ISOVUE-300 IOPAMIDOL (ISOVUE-300) INJECTION 61% COMPARISON:  02/03/2017 FINDINGS: Lower chest: Very large hiatal hernia with nearly the entire stomach in the chest. Segment of transverse colon also extends up into the hernia. Hepatobiliary: Small area of low attenuation in the anterior liver, adjacent to the falciform ligament, is in a characteristic location for focal fatty deposition. Gallbladder surgically absent. Stable prominence extrahepatic common duct with no dilatation  of the common bile duct in the head of the pancreas. Pancreas: No focal mass lesion. No dilatation of the main duct. No intraparenchymal cyst. No peripancreatic edema. Spleen: No splenomegaly. No focal mass lesion. Adrenals/Urinary Tract: No adrenal nodule or mass. No enhancing lesion in either kidney. Kidneys are not excreting contrast by the 2 minutes renal delayed sequence raising the question of renal dysfunction. No evidence for hydroureter. Bladder is decompressed. Stomach/Bowel: As above, nearly the entire stomach is contained within a hiatal hernia. Duodenum is  normally positioned as is the ligament of Treitz. No small bowel wall thickening. No small bowel dilatation. Small bowel appears foreshortened suggesting prior resection. Patient is status post right hemicolectomy. The actual ileocolic anastomosis may be in the hiatal hernia, but the bowel in the hernia so collapsed, this is difficult to assess. Sigmoid colon is draped across what appears to be the uterus (see below). The inflammation in the perianal region is similar to prior. Vascular/Lymphatic: No abdominal aortic aneurysm. There is no gastrohepatic or hepatoduodenal ligament lymphadenopathy. No intraperitoneal or retroperitoneal lymphadenopathy. No pelvic sidewall lymphadenopathy. Reproductive: There is more soft tissue cranial to the vagina than expected in the setting of hysterectomy. As such, and given that no surgical history of hysterectomy is identified in EPIC, this soft tissue is presumably the body and fundus of the uterus. The sigmoid colon is draped across the left aspect of the presumed fundus but there is no wall thickening in this segment of the colon. No direct contact between colon or small bowel and the vagina is evident. Other: No intraperitoneal free fluid. Musculoskeletal: Bone windows reveal no worrisome lytic or sclerotic osseous lesions. Mild compression deformity noted at the level of L1. IMPRESSION: 1. Similar perianal stranding and inflammation with potential tiny fistulous tract between the anus and the skin. This edema/inflammation also tracks centrally towards the vaginal introitus. 2. Very large hiatal hernia containing most of the stomach, the ileocolic anastomosis, and proximal transverse colon. No evidence for obstruction. 3. Stable appearance compression deformity at L1. Electronically Signed   By: Misty Stanley M.D.   On: 06/14/2017 20:39    Anti-infectives: Anti-infectives    Start     Dose/Rate Route Frequency Ordered Stop   06/08/17 2000  cefTRIAXone (ROCEPHIN) 1 g in  dextrose 5 % 50 mL IVPB  Status:  Discontinued     1 g 100 mL/hr over 30 Minutes Intravenous Every 24 hours 06/08/17 1954 06/12/17 0954       Assessment/Plan Crohn's disease - on Remicade, followed by Dr. Laural Golden GERD - controlled with pantoprazole Rectovaginal fistula AKI - resolved, Cr 0.92 Hypokalemia - replacement per medicine Severe malnutrition - Prealbumin 7.5 (7/9)  Dysphagia - h/o history of Schatzki's ring - EGD with balloon dilation 03/15/2017: moderate Schatzki ring, large hiatal hernia Large hiatal hernia - abdominal surgical history: appendectomy, cholecystectomy, 2-3 small bowel resections by Dr. Tamala Julian ~2005, colostomy followed by colostomy takedown 2009by Dr. Anthony Sar - at least 3 weeks of severe dysphagia and inability to tolerate any solid food - DG esophagus 7/5 showed deviation of the mid to distal esophagus around large hiatal hernia, no fixed strictures - CT yesterday showed Very large hiatal hernia containing most of the stomach, the ileocolic anastomosis, and proximal transverse colon; no obstruction  ID - rocephin 7/3>>7/7 VTE - SCDs, lovenox FEN - full liquids, Boost TID  Plan - Encourage patient to take in more of her diet today.  Dr. Hassell Done to see patient later today to discuss surgery, although  Ms. Richardson may not want to go through any more surgery in which case she could go home on full liquids and follow up in clinic to discuss surgery at a later date if she desires.   LOS: 7 days    Jerrye Beavers , [facility] 06/15/2017, 9:07 AM Pager: [phone] Consults: [phone] Mon-Fri 7:00 am-4:30 pm Sat-Sun 7:00 am-11:30 am

## 2017-06-16 LAB — DIFFERENTIAL
Basophils Absolute: 0 10*3/uL (ref 0.0–0.1)
Basophils Relative: 0 %
Eosinophils Absolute: 0.2 10*3/uL (ref 0.0–0.7)
Eosinophils Relative: 2 %
LYMPHS ABS: 5 10*3/uL — AB (ref 0.7–4.0)
LYMPHS PCT: 50 %
MONO ABS: 1.3 10*3/uL — AB (ref 0.1–1.0)
Monocytes Relative: 13 %
NEUTROS ABS: 3.6 10*3/uL (ref 1.7–7.7)
Neutrophils Relative %: 35 %

## 2017-06-16 LAB — COMPREHENSIVE METABOLIC PANEL
ALK PHOS: 108 U/L (ref 38–126)
ALT: 22 U/L (ref 14–54)
AST: 26 U/L (ref 15–41)
Albumin: 3 g/dL — ABNORMAL LOW (ref 3.5–5.0)
Anion gap: 9 (ref 5–15)
BILIRUBIN TOTAL: 0.9 mg/dL (ref 0.3–1.2)
CALCIUM: 8 mg/dL — AB (ref 8.9–10.3)
CO2: 8 mmol/L — ABNORMAL LOW (ref 22–32)
CREATININE: 0.84 mg/dL (ref 0.44–1.00)
Chloride: 121 mmol/L — ABNORMAL HIGH (ref 101–111)
GFR calc Af Amer: >60 mL/min (ref >60)
Glucose, Bld: 77 mg/dL (ref 65–99)
POTASSIUM: 3.4 mmol/L — AB (ref 3.5–5.1)
Sodium: 138 mmol/L (ref 135–145)
TOTAL PROTEIN: 6.8 g/dL (ref 6.5–8.1)

## 2017-06-16 LAB — TYPE AND SCREEN
ABO/RH(D): O POS
ANTIBODY SCREEN: NEGATIVE
UNIT DIVISION: 0
Unit division: 0

## 2017-06-16 LAB — CBC
HCT: 32 % — ABNORMAL LOW (ref 36.0–46.0)
Hemoglobin: 10.7 g/dL — ABNORMAL LOW (ref 12.0–15.0)
MCH: 26.7 pg (ref 26.0–34.0)
MCHC: 33.4 g/dL (ref 30.0–36.0)
MCV: 79.8 fL (ref 78.0–100.0)
Platelets: 135 K/uL — ABNORMAL LOW (ref 150–400)
RBC: 4.01 MIL/uL (ref 3.87–5.11)
RDW: 17.9 % — ABNORMAL HIGH (ref 11.5–15.5)
WBC: 10.1 K/uL (ref 4.0–10.5)

## 2017-06-16 LAB — BPAM RBC
BLOOD PRODUCT EXPIRATION DATE: 201807282359
Blood Product Expiration Date: 201807282359
ISSUE DATE / TIME: 201807101646
ISSUE DATE / TIME: 201807102018
UNIT TYPE AND RH: 5100
Unit Type and Rh: 5100

## 2017-06-16 LAB — PHOSPHORUS: Phosphorus: 3.2 mg/dL (ref 2.5–4.6)

## 2017-06-16 LAB — MAGNESIUM: Magnesium: 1.9 mg/dL (ref 1.7–2.4)

## 2017-06-16 LAB — TRIGLYCERIDES: Triglycerides: 77 mg/dL

## 2017-06-16 LAB — PREALBUMIN: PREALBUMIN: 9.4 mg/dL — AB (ref 18–38)

## 2017-06-16 LAB — GLUCOSE, CAPILLARY
Glucose-Capillary: 100 mg/dL — ABNORMAL HIGH (ref 65–99)
Glucose-Capillary: 126 mg/dL — ABNORMAL HIGH (ref 65–99)

## 2017-06-16 MED ORDER — FUROSEMIDE 20 MG PO TABS: 20.0000 mg | ORAL_TABLET | Freq: Every day | ORAL | 0 refills | Status: AC | PRN

## 2017-06-16 MED ORDER — HEPARIN SOD (PORK) LOCK FLUSH 100 UNIT/ML IV SOLN
250.0000 [IU] | INTRAVENOUS | Status: AC | PRN
  Administered 2017-06-16: 250 [IU]

## 2017-06-16 MED ORDER — FAT EMULSION 20 % IV EMUL
240.0000 mL | INTRAVENOUS | Status: AC
  Administered 2017-06-16: 240 mL via INTRAVENOUS
  Filled 2017-06-16: qty 250

## 2017-06-16 MED ORDER — POTASSIUM CHLORIDE 20 MEQ/15ML (10%) PO SOLN
40.0000 meq | Freq: Once | ORAL | Status: AC
  Administered 2017-06-16: 40 meq via ORAL
  Filled 2017-06-16: qty 30

## 2017-06-16 MED ORDER — TRACE MINERALS CR-CU-MN-SE-ZN 10-1000-500-60 MCG/ML IV SOLN
INTRAVENOUS | Status: DC
  Administered 2017-06-16: 17:00:00 via INTRAVENOUS
  Filled 2017-06-16: qty 960

## 2017-06-16 MED ORDER — FUROSEMIDE 20 MG PO TABS: 20.0000 mg | ORAL_TABLET | Freq: Every day | ORAL | 11 refills | Status: DC

## 2017-06-16 NOTE — Discharge Instructions (Signed)
Hiatal Hernia A hiatal hernia occurs when part of the stomach slides above the muscle that separates the abdomen from the chest (diaphragm). A person can be born with a hiatal hernia (congenital), or it may develop over time. In almost all cases of hiatal hernia, only the top part of the stomach pushes through the diaphragm. Many people have a hiatal hernia with no symptoms. The larger the hernia, the more likely it is that you will have symptoms. In some cases, a hiatal hernia allows stomach acid to flow back into the tube that carries food from your mouth to your stomach (esophagus). This may cause heartburn symptoms. Severe heartburn symptoms may mean that you have developed a condition called gastroesophageal reflux disease (GERD). What are the causes? This condition is caused by a weakness in the opening (hiatus) where the esophagus passes through the diaphragm to attach to the upper part of the stomach. A person may be born with a weakness in the hiatus, or a weakness can develop over time. What increases the risk? This condition is more likely to develop in:  Older people. Age is a major risk factor for a hiatal hernia, especially if you are over the age of 9.  Pregnant women.  People who are overweight.  People who have frequent constipation.  What are the signs or symptoms? Symptoms of this condition usually develop in the form of GERD symptoms. Symptoms include:  Heartburn.  Belching.  Indigestion.  Trouble swallowing.  Coughing or wheezing.  Sore throat.  Hoarseness.  Chest pain.  Nausea and vomiting.  How is this diagnosed? This condition may be diagnosed during testing for GERD. Tests that may be done include:  X-rays of your stomach or chest.  An upper gastrointestinal (GI) series. This is an X-ray exam of your GI tract that is taken after you swallow a chalky liquid that shows up clearly on the X-ray.  Endoscopy. This is a procedure to look into your  stomach using a thin, flexible tube that has a tiny camera and light on the end of it.  How is this treated? This condition may be treated by:  Dietary and lifestyle changes to help reduce GERD symptoms.  Medicines. These may include: ? Over-the-counter antacids. ? Medicines that make your stomach empty more quickly. ? Medicines that block the production of stomach acid (H2 blockers). ? Stronger medicines to reduce stomach acid (proton pump inhibitors).  Surgery to repair the hernia, if other treatments are not helping.  If you have no symptoms, you may not need treatment. Follow these instructions at home: Lifestyle and activity  Do not use any products that contain nicotine or tobacco, such as cigarettes and e-cigarettes. If you need help quitting, ask your health care provider.  Try to achieve and maintain a healthy body weight.  Avoid putting pressure on your abdomen. Anything that puts pressure on your abdomen increases the amount of acid that may be pushed up into your esophagus. ? Avoid bending over, especially after eating. ? Raise the head of your bed by putting blocks under the legs. This keeps your head and esophagus higher than your stomach. ? Do not wear tight clothing around your chest or stomach. ? Try not to strain when having a bowel movement, when urinating, or when lifting heavy objects. Eating and drinking  Avoid foods that can worsen GERD symptoms. These may include: ? Fatty foods, like fried foods. ? Citrus fruits, like oranges or lemon. ? Other foods and drinks that  contain acid, like orange juice or tomatoes. ? Spicy food. ? Chocolate.  Eat frequent small meals instead of three large meals a day. This helps prevent your stomach from getting too full. ? Eat slowly. ? Do not lie down right after eating. ? Do not eat 1-2 hours before bed.  Do not drink beverages with caffeine. These include cola, coffee, cocoa, and tea.  Do not drink alcohol. General  instructions  Take over-the-counter and prescription medicines only as told by your health care provider.  Keep all follow-up visits as told by your health care provider. This is important. Contact a health care provider if:  Your symptoms are not controlled with medicines or lifestyle changes.  You are having trouble swallowing.  You have coughing or wheezing that will not go away. Get help right away if:  Your pain is getting worse.  Your pain spreads to your arms, neck, jaw, teeth, or back.  You have shortness of breath.  You sweat for no reason.  You feel sick to your stomach (nauseous) or you vomit.  You vomit blood.  You have bright red blood in your stools.  You have black, tarry stools. This information is not intended to replace advice given to you by your health care provider. Make sure you discuss any questions you have with your health care provider. Document Released: 02/13/2004 Document Revised: 11/16/2016 Document Reviewed: 11/16/2016 Elsevier Interactive Patient Education  2018 [employer].    Parenteral Nutrition, Adult Parenteral nutrition is a way for you to get nutrition through an IV tube (catheter) that is attached to a pump. You may get some nutrition (partial parenteral nutrition) or all nutrition (total parenteral nutrition) this way. The catheter is attached to a vein in your chest, neck, or leg. The nutrition solution contains protein, carbohydrates, fats, sugars, vitamins, and minerals. The nutrients go directly into your blood. You may need parenteral nutrition if you cannot eat or absorb nutrients normally. This may happen because of a swallowing or digestive tract disorder. Or, it may be needed if you have a gastrointestinal condition that requires complete bowel rest. Parenteral nutrition may be temporary or long-term. Risks and complications Generally, this is a safe way to receive nutrition. However, problems may occur,  including:  Infection.  High or low blood sugar.  High cholesterol.  Allergic reactions.  An imbalance between minerals and electrolytes.  Liver problems.  Bone disease.  How to give parenteral nutrition There are many types of pumps and home infusion devices. Work with your health care providers to find what will work best for you. Follow the instructions on the device. Preparing your supplies   Remove the solution from the refrigerator. Let the solution get to room temperature for 2-4 hours before you use it. Check the expiration date. Do not use the solution if it is cloudy or discolored or it contains floating parts.  Wash and dry your hands before you set out the supplies.  Place your supplies on a sterile surface. These include: ? Alcohol wipes. ? Syringes. ? Nutrient bag. ? Nutrient administration set.  Add medicines or vitamins to the bag only as told by your health care provider.  Before you fill the syringe with the medicine or vitamins, use alcohol wipes to clean the medicine port and the medicine vial.  Gently shake the bag to mix. The solution may turn yellow. Starting the infusion  Insert the administration tube into the main port of the bag.  Open the port and  fill the tubing. Close the clamp.  Thread the tubing through the pump.  Use an alcohol wipe to clean your catheter port.  Flush your catheter with a saline solution.  Attach the bag to your catheter.  To start IV feeding, open the clamps on your catheter and bag tubing. Ending the infusion  An alarm may sound when the bag is empty.  Disconnect the bag tubing from your catheter.  Flush your catheter with a saline solution.  Close the clamp.  Put needles and syringes in a disposal container that is meant for sharp items (sharps).  Follow instructions from your health care provider about how to care for your catheter.  Change your bandage (dressing) as told by your health care  provider.  Secure the tubing to your body as told by your health care provider.  Keep a record of your feeding times and amounts, weight, height, urine output, blood sugar, or other factors as told by your health care provider. Contact a health care provider if:  You have: ? New or worsening symptoms. ? A fever or chills. ? Pain around the catheter site. ? Blood or fluid leaking from the catheter tubing. ? Trouble giving an infusion.  You cannot flush out the catheter.  Your catheter gets damaged.  You lose weight. Get help right away if:  You have shortness of breath. This information is not intended to replace advice given to you by your health care provider. Make sure you discuss any questions you have with your health care provider. Document Released: 04/09/2014 Document Revised: 07/21/2016 Document Reviewed: 06/22/2016 Elsevier Interactive Patient Education  [employer].

## 2017-06-16 NOTE — Progress Notes (Signed)
[facility] Progress Note  Patient: Danielle [name] KZL:[id number]   PCP: Iona Beard, MD DOB: Sep 02, 1946   DOA: 06/08/2017   DOS: 06/16/2017   Date of Service: the patient was seen and examined on 06/16/2017  Subjective: Feeling better, nausea no vomiting. Abdominal pain is also better. No fever no chills.  Brief hospital course: Pt. with PMH of Crohn's disease, large hiatal hernia.; admitted on 06/08/2017, presented with complaint of abdominal pain and nausea and vomiting, was found to have hiatal hernia. Currently further plan is improved nutrition.  Assessment and Plan: 1. Dysphagia. Large hiatal hernia. GI consulted, esophagogram shows no other acute abnormality other than the hiatal hernia. Surgery consulted, recommends to start the patient on TPN and follow up as an outpatient to schedule correction of the hiatal hernia. PICC line has been inserted. Currently awaiting approval from the insurance for TPN.  2. Volume overload. Acute hypoxemic respiratory failure. Patient Was Given IV Fluids on Admission, with a Volume Overloaded, Received IV Lasix. We'll Change Lasix to When Necessary.  3. Severe malnutrition. Monitor daily electrolytes. On TPN. Full liquid diet on discharge.  4. History of Crohn's disease. Follow-up with GI as recommended. Currently on Remicade infusion.  5. Asymptomatic bacteriuria. Monitor.  Diet: full liquid DVT Prophylaxis: subcutaneous Heparin  Advance goals of care discussion: full code  Family Communication: no family was present at bedside, at the time of interview.  Disposition:  Discharge to home with home health pending insurance authorization for TPN.  Consultants: gastroenterology, gen surgery  Procedures: PICC line  Antibiotics: Anti-infectives    Start     Dose/Rate Route Frequency Ordered Stop   06/08/17 2000  cefTRIAXone (ROCEPHIN) 1 g in dextrose 5 % 50 mL IVPB  Status:  Discontinued     1 g 100 mL/hr over 30 Minutes  Intravenous Every 24 hours 06/08/17 1954 06/12/17 0954       Objective: Physical Exam: Vitals:   06/15/17 2045 06/15/17 2252 06/16/17 0341 06/16/17 1400  BP: 100/61 103/65 (!) 97/57 102/64  Pulse: 81 85 88 80  Resp: (!) 22 (!) 22 20 17   Temp: 97.8 F (36.6 C) 97.9 F (36.6 C) 98.1 F (36.7 C) 98 F (36.7 C)  TempSrc: Oral Oral Oral Oral  SpO2: 100% 100% 100% 100%  Weight:      Height:        Intake/Output Summary (Last 24 hours) at 06/16/17 1814 Last data filed at 06/16/17 1717  Gross per 24 hour  Intake           593.83 ml  Output                0 ml  Net           593.83 ml   Filed Weights   06/08/17 1536 06/10/17 0835 06/14/17 1327  Weight: 51.7 kg (114 lb) 51.7 kg (114 lb) 52.4 kg (115 lb 8.3 oz)   General: Alert, Awake and Oriented to Time, Place and Person. Appear in  distress, affect appropriate Eyes: PERRL, Conjunctiva normal ENT: Oral Mucosa clear moist. Neck: no JVD, no Abnormal Mass Or lumps Cardiovascular: S1 and S2 Present, no Murmur, Peripheral Pulses Present Respiratory: normal respiratory effort, Bilateral Air entry equal and Decreased, no use of accessory muscle, Clear to Auscultation, no Crackles, no wheezes Abdomen: Bowel Sound present, Soft and no tenderness, no hernia Skin: no redness, no Rash, no induration Extremities: no Pedal edema, no calf tenderness Neurologic: Grossly no focal neuro deficit. Bilaterally Equal  motor strength  Data Reviewed: CBC:  Recent Labs Lab 06/11/17 0419 06/12/17 0930 06/14/17 0411 06/15/17 0829 06/16/17 0445  WBC 9.1 13.6* 7.7 7.7 10.1  NEUTROABS 2.5  --  3.0 2.0 3.6  HGB 8.5* 9.0* 7.5* 7.3* 10.7*  HCT 25.8* 27.9* 22.3* 22.1* 32.0*  MCV 81.9 83.0 79.9 79.8 79.8  PLT 137* 144* 123* 126* 035*   Basic Metabolic Panel:  Recent Labs Lab 06/11/17 0419  06/12/17 0937 06/13/17 1005 06/14/17 0411 06/15/17 0412 06/16/17 0445  NA 140  < > 140 143 142 139 138  K 3.2*  < > 4.2 3.3* 3.0* 3.4* 3.4*  CL 126*  <  > 126* 129* 124* 123* 121*  CO2 7*  < > <7* 8* 9* 7* 8*  GLUCOSE 90  < > 115* 85 79 73 77  BUN 8  < > 6 7 6  5* <5*  CREATININE 1.11*  < > 0.93 0.81 0.92 0.86 0.84  CALCIUM 7.3*  < > 7.6* 7.8* 7.6* 7.7* 8.0*  MG 1.3*  --  1.6* 1.5*  --  1.2* 1.9  PHOS 2.4*  --  2.5 3.4  --  3.2 3.2  < > = values in this interval not displayed.  Liver Function Tests:  Recent Labs Lab 06/11/17 0419 06/12/17 0937 06/13/17 1005 06/15/17 0412 06/16/17 0445  AST 14* 21 23 24 26   ALT 11* 15 16 19 22   ALKPHOS 87 98 91 91 108  BILITOT 0.4 0.2* 0.1* 0.4 0.9  PROT 5.8* 6.4* 5.8* 5.6* 6.8  ALBUMIN 2.4* 2.7* 2.5* 2.4* 3.0*   No results for input(s): LIPASE, AMYLASE in the last 168 hours. No results for input(s): AMMONIA in the last 168 hours. Coagulation Profile: No results for input(s): INR, PROTIME in the last 168 hours. Cardiac Enzymes: No results for input(s): CKTOTAL, CKMB, CKMBINDEX, TROPONINI in the last 168 hours. BNP (last 3 results) No results for input(s): PROBNP in the last 8760 hours. CBG:  Recent Labs Lab 06/16/17 0619 06/16/17 1306  GLUCAP 100* 126*   Studies: No results found.  Scheduled Meds: . capsicum oleoresin   Topical TID  . cyanocobalamin  1,000 mcg Intramuscular Q30 days  . enoxaparin (LOVENOX) injection  40 mg Subcutaneous Q24H  . feeding supplement  1 Container Oral TID BM  . feeding supplement (ENSURE ENLIVE)  237 mL Oral BID BM  . insulin aspart  0-9 Units Subcutaneous Q6H  . loperamide  4 mg Oral Q6H  . pantoprazole  40 mg Oral Daily  . sodium bicarbonate  1,300 mg Oral BID  . sodium chloride flush  3 mL Intravenous Q12H   Continuous Infusions: . sodium chloride    . Marland KitchenTPN (CLINIMIX-E) Adult 40 mL/hr at 06/16/17 1717   And  . fat emulsion 240 mL (06/16/17 1717)   PRN Meds: acetaminophen **OR** acetaminophen, albuterol, diphenoxylate-atropine, Gerhardt's butt cream, iopamidol, morphine injection, ondansetron **OR** ondansetron (ZOFRAN) IV,  oxyCODONE-acetaminophen, sodium chloride flush  Time spent: 35 minutes  Author: Berle Mull, MD [facility] Pager: [phone] 06/16/2017 6:14 PM  If 7PM-7AM, please contact night-coverage at [email or web address], password [facility]

## 2017-06-16 NOTE — Progress Notes (Signed)
Patient ID: Danielle [name], female   DOB: [birth year], 71 y.o.   MRN: [id number]  [facility] Surgery Progress Note  6 Days Post-Op  Subjective: CC- dysphagia States that she is ready to go home. PICC has been placed and TPN started. Denies any current abdominal pain, nausea, or vomiting. Currently sipping on Boost.   Objective: Vital signs in last 24 hours: Temp:  [97.8 F (36.6 C)-98.5 F (36.9 C)] 98.1 F (36.7 C) (07/11 0341) Pulse Rate:  [81-96] 88 (07/11 0341) Resp:  [18-22] 20 (07/11 0341) BP: (92-103)/(54-65) 97/57 (07/11 0341) SpO2:  [100 %] 100 % (07/11 0341) Last BM Date: 06/15/17  Intake/Output from previous day: 07/10 0701 - 07/11 0700 In: 1047.8 [P.O.:474; I.V.:19.8; Blood:554] Out: 0  Intake/Output this shift: No intake/output data recorded.  PE: Gen: Alert, NAD, pleasant HEENT: EOM's intact, pupils equal  Card: RRR, no M/G/R heard Pulm: CTAB, no W/R/R, effort normal Abd: well healed midline incision and previous colostomy site rlq, soft, ND, +BS, no masses or organomegaly. No abdominal tenderness Skin: no rashes noted, warm and dry    Lab Results:   Recent Labs  06/15/17 0829 06/16/17 0445  WBC 7.7 10.1  HGB 7.3* 10.7*  HCT 22.1* 32.0*  PLT 126* 135*   BMET  Recent Labs  06/15/17 0412 06/16/17 0445  NA 139 138  K 3.4* 3.4*  CL 123* 121*  CO2 7* 8*  GLUCOSE 73 77  BUN 5* <5*  CREATININE 0.86 0.84  CALCIUM 7.7* 8.0*   PT/INR No results for input(s): LABPROT, INR in the last 72 hours. CMP     Component Value Date/Time   NA 138 06/16/2017 0445   K 3.4 (L) 06/16/2017 0445   CL 121 (H) 06/16/2017 0445   CO2 8 (L) 06/16/2017 0445   GLUCOSE 77 06/16/2017 0445   BUN <5 (L) 06/16/2017 0445   CREATININE 0.84 06/16/2017 0445   CREATININE 1.37 (H) 03/01/2017 1149   CALCIUM 8.0 (L) 06/16/2017 0445   PROT 6.8 06/16/2017 0445   ALBUMIN 3.0 (L) 06/16/2017 0445   AST 26 06/16/2017 0445   ALT 22 06/16/2017 0445   ALKPHOS 108  06/16/2017 0445   BILITOT 0.9 06/16/2017 0445   GFRNONAA >60 06/16/2017 0445   GFRNONAA 42 (L) 11/15/2013 0934   GFRAA >60 06/16/2017 0445   GFRAA 49 (L) 11/15/2013 0934   Lipase     Component Value Date/Time   LIPASE 52 (H) 06/08/2017 1543       Studies/Results: Ct Abdomen Pelvis W Contrast  Result Date: 06/14/2017 CLINICAL DATA:  Crohn disease and rectovaginal fistula. EXAM: CT ABDOMEN AND PELVIS WITH CONTRAST TECHNIQUE: Multidetector CT imaging of the abdomen and pelvis was performed using the standard protocol following bolus administration of intravenous contrast. CONTRAST:  40mL ISOVUE-300 IOPAMIDOL (ISOVUE-300) INJECTION 61%, 170mL ISOVUE-300 IOPAMIDOL (ISOVUE-300) INJECTION 61% COMPARISON:  02/03/2017 FINDINGS: Lower chest: Very large hiatal hernia with nearly the entire stomach in the chest. Segment of transverse colon also extends up into the hernia. Hepatobiliary: Small area of low attenuation in the anterior liver, adjacent to the falciform ligament, is in a characteristic location for focal fatty deposition. Gallbladder surgically absent. Stable prominence extrahepatic common duct with no dilatation of the common bile duct in the head of the pancreas. Pancreas: No focal mass lesion. No dilatation of the main duct. No intraparenchymal cyst. No peripancreatic edema. Spleen: No splenomegaly. No focal mass lesion. Adrenals/Urinary Tract: No adrenal nodule or mass. No enhancing lesion in either kidney. Kidneys  are not excreting contrast by the 2 minutes renal delayed sequence raising the question of renal dysfunction. No evidence for hydroureter. Bladder is decompressed. Stomach/Bowel: As above, nearly the entire stomach is contained within a hiatal hernia. Duodenum is normally positioned as is the ligament of Treitz. No small bowel wall thickening. No small bowel dilatation. Small bowel appears foreshortened suggesting prior resection. Patient is status post right hemicolectomy. The actual  ileocolic anastomosis may be in the hiatal hernia, but the bowel in the hernia so collapsed, this is difficult to assess. Sigmoid colon is draped across what appears to be the uterus (see below). The inflammation in the perianal region is similar to prior. Vascular/Lymphatic: No abdominal aortic aneurysm. There is no gastrohepatic or hepatoduodenal ligament lymphadenopathy. No intraperitoneal or retroperitoneal lymphadenopathy. No pelvic sidewall lymphadenopathy. Reproductive: There is more soft tissue cranial to the vagina than expected in the setting of hysterectomy. As such, and given that no surgical history of hysterectomy is identified in EPIC, this soft tissue is presumably the body and fundus of the uterus. The sigmoid colon is draped across the left aspect of the presumed fundus but there is no wall thickening in this segment of the colon. No direct contact between colon or small bowel and the vagina is evident. Other: No intraperitoneal free fluid. Musculoskeletal: Bone windows reveal no worrisome lytic or sclerotic osseous lesions. Mild compression deformity noted at the level of L1. IMPRESSION: 1. Similar perianal stranding and inflammation with potential tiny fistulous tract between the anus and the skin. This edema/inflammation also tracks centrally towards the vaginal introitus. 2. Very large hiatal hernia containing most of the stomach, the ileocolic anastomosis, and proximal transverse colon. No evidence for obstruction. 3. Stable appearance compression deformity at L1. Electronically Signed   By: Misty Stanley M.D.   On: 06/14/2017 20:39    Anti-infectives: Anti-infectives    Start     Dose/Rate Route Frequency Ordered Stop   06/08/17 2000  cefTRIAXone (ROCEPHIN) 1 g in dextrose 5 % 50 mL IVPB  Status:  Discontinued     1 g 100 mL/hr over 30 Minutes Intravenous Every 24 hours 06/08/17 1954 06/12/17 0954       Assessment/Plan Crohn's disease - on Remicade, followed by Dr. Laural Golden GERD  - controlled with pantoprazole Rectovaginal fistula AKI - resolved, Cr 0.92 Hypokalemia - replacement per medicine Severe malnutrition - Prealbumin 7.5 (7/9)  Dysphagia - h/o history of Schatzki's ring - EGD with balloon dilation 03/15/2017: moderate Schatzki ring, large hiatal hernia Large hiatal hernia - abdominal surgical history: appendectomy, cholecystectomy, 2-3 small bowel resections by Dr. Tamala Julian ~2005, colostomy followed by colostomy takedown 2009by Dr. Anthony Sar - at least 3 weeks of severe dysphagia and inability to tolerate any solid food - DG esophagus 7/5 showed deviation of the mid to distal esophagus around large hiatal hernia, no fixed strictures - CT 7/9 showed very large hiatal hernia containing most of the stomach, the ileocolic anastomosis, and proximal transverse colon; no obstruction  ID - rocephin 7/3>>7/7 VTE - SCDs, lovenox FEN - TPN, full liquids, Boost TID  Plan - PICC/TPN started. Patient to go home with PICC/TPN. She may follow-up with Dr. Hassell Done to discuss surgical repair of hiatal hernia.    LOS: 8 days    Jerrye Beavers , [facility] Surgery 06/16/2017, 8:06 AM Pager: [phone] Consults: [phone] Mon-Fri 7:00 am-4:30 pm Sat-Sun 7:00 am-11:30 am

## 2017-06-16 NOTE — Progress Notes (Addendum)
[facility] unable to offer HHRN/PT to pt on tomorrow. Pt is okay with that.  Referral given to [facility] and [facility] for infusion in house reps.

## 2017-06-16 NOTE — Progress Notes (Signed)
PHARMACY - ADULT TOTAL PARENTERAL NUTRITION CONSULT NOTE   Pharmacy Consult for TPN Indication: poor oral intake, severe dysphagia  Patient Measurements: Height: 5\' 4"  (162.6 cm) Weight: 115 lb 8.3 oz (52.4 kg) IBW/kg (Calculated) : 54.7 TPN AdjBW (KG): 51.7 Body mass index is 19.83 kg/m. Usual Weight:   Insulin Requirements: none  Current Nutrition: full liquid diet, intolerant, Boost/Breeze  IVF: none  Central access: PICC placed 7/5 TPN start date: 7/10  ASSESSMENT     Potassium chloride 40 meq PO x 1                                                                                                       HPI: Pt with extensive GI history, including Crohn's disease on Remicade. Pt unable to eat due  to dysphagia. Per RD consult pt at high risk for refeeding syndrome. Pharmacy consulted to start TPN to improve nutrition status prior to having surgery for large hiatal hernia as she has not eaten enough to meet estimated needs in several weeks.  Significant events:  7/5 single lumen PICC placed 7/11 TPN started at 05:30 (delayed due to blood transfusion and potassium repletion)  Today:    Glucose - at goal < 150  Electrolytes - K+ 3.4 CorrCa WNL, Mg replenish by MD   Renal - Scr WNL  LFTs - WNL  TGs - 77  Prealbumin - 9.4  NUTRITIONAL GOALS                                                                                             RD recs: Kcal:  1550-1750kcal/day   Protein:  78-88g/day   Fluid:  >1.5L/day    Clinimix E  5/15 at a goal rate of 70 ml/hr + 20% fat emulsion at 20 ml/hr to provide: 84 g/day protein, 1672 Kcal/day.  PLAN                                                                                                                         If patient is not discharged, at 1800 today:  Increase Clinimix E 5/15 to 40 ml/hr (slowly due to risk of refeeding syndrome and since TPN not  started until 05:30 this morning).  20% fat emulsion at 10 ml/hr  over 12 hrs.  Plan to advance as tolerated to the goal rate.  TPN to contain standard multivitamins and trace elements.  Continue sensitive SSI q6h  .   TPN lab panels on Mondays & Thursdays.  F/u daily.  Peggyann Juba, PharmD, BCPS Pager: [phone] 06/16/2017 8:58 AM

## 2017-06-16 NOTE — Progress Notes (Addendum)
Waiting for insurance co approval for Home TPN.

## 2017-06-17 DIAGNOSIS — R1310 Dysphagia, unspecified: Secondary | ICD-10-CM

## 2017-06-17 DIAGNOSIS — K222 Esophageal obstruction: Secondary | ICD-10-CM

## 2017-06-17 DIAGNOSIS — K5090 Crohn's disease, unspecified, without complications: Secondary | ICD-10-CM | POA: Diagnosis not present

## 2017-06-17 DIAGNOSIS — K449 Diaphragmatic hernia without obstruction or gangrene: Secondary | ICD-10-CM | POA: Diagnosis not present

## 2017-06-17 DIAGNOSIS — N823 Fistula of vagina to large intestine: Secondary | ICD-10-CM | POA: Diagnosis not present

## 2017-06-17 DIAGNOSIS — E43 Unspecified severe protein-calorie malnutrition: Secondary | ICD-10-CM

## 2017-06-17 DIAGNOSIS — K219 Gastro-esophageal reflux disease without esophagitis: Secondary | ICD-10-CM | POA: Diagnosis not present

## 2017-06-17 DIAGNOSIS — D649 Anemia, unspecified: Secondary | ICD-10-CM | POA: Diagnosis not present

## 2017-06-17 DIAGNOSIS — L409 Psoriasis, unspecified: Secondary | ICD-10-CM | POA: Diagnosis not present

## 2017-06-17 DIAGNOSIS — R112 Nausea with vomiting, unspecified: Secondary | ICD-10-CM | POA: Diagnosis not present

## 2017-06-17 DIAGNOSIS — N390 Urinary tract infection, site not specified: Secondary | ICD-10-CM | POA: Diagnosis not present

## 2017-06-17 DIAGNOSIS — N183 Chronic kidney disease, stage 3 (moderate): Secondary | ICD-10-CM | POA: Diagnosis not present

## 2017-06-17 DIAGNOSIS — Z452 Encounter for adjustment and management of vascular access device: Secondary | ICD-10-CM | POA: Diagnosis not present

## 2017-06-17 LAB — GLUCOSE, CAPILLARY
Glucose-Capillary: 114 mg/dL — ABNORMAL HIGH (ref 65–99)
Glucose-Capillary: 122 mg/dL — ABNORMAL HIGH (ref 65–99)
Glucose-Capillary: 95 mg/dL (ref 65–99)

## 2017-06-17 LAB — COMPREHENSIVE METABOLIC PANEL
ALK PHOS: 101 U/L (ref 38–126)
ALT: 21 U/L (ref 14–54)
AST: 23 U/L (ref 15–41)
Albumin: 2.8 g/dL — ABNORMAL LOW (ref 3.5–5.0)
Anion gap: 9 (ref 5–15)
BUN: 10 mg/dL (ref 6–20)
CALCIUM: 8 mg/dL — AB (ref 8.9–10.3)
CHLORIDE: 119 mmol/L — AB (ref 101–111)
CO2: 10 mmol/L — ABNORMAL LOW (ref 22–32)
CREATININE: 0.85 mg/dL (ref 0.44–1.00)
Glucose, Bld: 102 mg/dL — ABNORMAL HIGH (ref 65–99)
Potassium: 3 mmol/L — ABNORMAL LOW (ref 3.5–5.1)
Sodium: 138 mmol/L (ref 135–145)
Total Bilirubin: 0.3 mg/dL (ref 0.3–1.2)
Total Protein: 6.4 g/dL — ABNORMAL LOW (ref 6.5–8.1)

## 2017-06-17 LAB — MAGNESIUM: MAGNESIUM: 1.6 mg/dL — AB (ref 1.7–2.4)

## 2017-06-17 LAB — PHOSPHORUS: PHOSPHORUS: 3.7 mg/dL (ref 2.5–4.6)

## 2017-06-17 MED ORDER — MAGNESIUM SULFATE 2 GM/50ML IV SOLN: 2.0000 g | Freq: Once | INTRAVENOUS | Status: DC

## 2017-06-17 MED ORDER — POTASSIUM CHLORIDE 20 MEQ/15ML (10%) PO SOLN: 20.0000 meq | Freq: Once | ORAL | Status: DC

## 2017-06-17 MED ORDER — HEPARIN SOD (PORK) LOCK FLUSH 100 UNIT/ML IV SOLN
250.0000 [IU] | INTRAVENOUS | Status: AC | PRN
  Administered 2017-06-17: 250 [IU]

## 2017-06-17 MED ORDER — POTASSIUM CHLORIDE 20 MEQ/15ML (10%) PO SOLN
40.0000 meq | Freq: Once | ORAL | Status: AC
  Administered 2017-06-17: 40 meq via ORAL
  Filled 2017-06-17: qty 30

## 2017-06-17 NOTE — Progress Notes (Signed)
PHARMACY - ADULT TOTAL PARENTERAL NUTRITION CONSULT NOTE   Pharmacy Consult for TPN Indication: poor oral intake, severe dysphagia  Patient Measurements: Height: 5\' 4"  (162.6 cm) Weight: 115 lb 8.3 oz (52.4 kg) IBW/kg (Calculated) : 54.7 TPN AdjBW (KG): 51.7 Body mass index is 19.83 kg/m. Usual Weight:   Insulin Requirements: none  Current Nutrition: full liquid diet, intolerant, Boost/Breeze  IVF: none  Central access: PICC placed 7/5 TPN start date: 7/10  ASSESSMENT     Potassium chloride 40 meq PO x 1                                                                                                       HPI: Pt with extensive GI history, including Crohn's disease on Remicade. Pt unable to eat due  to dysphagia. Per RD consult pt at high risk for refeeding syndrome. Pharmacy consulted to start TPN to improve nutrition status prior to having surgery for large hiatal hernia as she has not eaten enough to meet estimated needs in several weeks.  Significant events:  7/5 single lumen PICC placed 7/11 TPN started at 05:30 (delayed due to blood transfusion and potassium repletion)  Today:    Glucose - at goal < 150  Electrolytes - K+ 3.0, mag 1.6,  CorrCa WNL,  Renal - Scr WNL  LFTs - WNL  TGs - 77  Prealbumin - 9.4  NUTRITIONAL GOALS                                                                                             RD recs: Kcal:  1550-1750kcal/day   Protein:  78-88g/day   Fluid:  >1.5L/day    Clinimix E  5/15 at a goal rate of 70 ml/hr + 20% fat emulsion at 20 ml/hr to provide: 84 g/day protein, 1672 Kcal/day.  PLAN                                                                                                       Mag 2 gm and kcl 60 meq po supplement  (40 +20)         If patient is not discharged, at 1800 today:  Increase Clinimix E 5/15 to 50 ml/hr (slowly due to risk of refeeding syndrome and since TPN not  started until 05:30 this  morning).  20% fat emulsion at 10 ml/hr over 12 hrs.  Plan to advance as tolerated to the goal rate of 70 ml/hr  TPN to contain standard multivitamins and trace elements.  Continue sensitive SSI q6h  .   TPN lab panels on Mondays & Thursdays.  F/u daily.  Eudelia Bunch, Pharm.D. [phone] 06/17/2017 7:37 AM

## 2017-06-17 NOTE — Discharge Summary (Signed)
Physician Discharge Summary  Danielle [name] WLN:[id number] DOB: 1946-10-16 DOA: 06/08/2017  PCP: Iona Beard, MD  Admit date: 06/08/2017 Discharge date: 06/17/2017  Time spent: 45 minutes  Recommendations for Outpatient Follow-up:  1. FU with [facility] Dr.Matthew Hassell Done in [place] to discuss surgical options for Hiatal hernia 2. Continue TPN via PICC line until surgery, monitor labs-Monitor CHem 12 and prealbumin, next in 3-4days via PCP's office 3. liquid or Soft diet as tolerated   Discharge Diagnoses:  Principal Problem:   Nausea and vomiting   Dysphagia   Large hiatal hernia   Leukocytosis   Hypomagnesemia   Acute renal failure superimposed on stage 3 chronic kidney disease (HCC)   Crohn disease (HCC)   Hypokalemia   Acute lower UTI   Protein-calorie malnutrition, severe   Discharge Condition: stable  Diet recommendation: Liquid or soft diet as tolerated  Filed Weights   06/08/17 1536 06/10/17 0835 06/14/17 1327  Weight: 51.7 kg (114 lb) 51.7 kg (114 lb) 52.4 kg (115 lb 8.3 oz)    History of present illness:  Danielle [name] is a 71 y.o. female with medical history significant of psoriasis, Crohn's disease (followed by GI, Dr. Laural Golden on remicaide infusion), hiatal hernia, GERD, CKD stage III, and H/O rectovaginal fistula; who presents with complaints of vomiting. Patient was just recently admitted into the hospital 5/31-6/8 sepsis secondary to urinary tract infection and possible Crohn's flare. She had just had a recent perirectal abscess I&D by Dr. Rush Farmer on 5/16. Since being home patient reports having poor oral appetite. Patient reports [facility] had helped in the past, but insurances does not cover it. However, yesterday patient began having vomiting with reports achy epigastric abdominal pain. Emesis is nonbloody and is unable to keep any food or liquids down.  Hospital Course:  1. Severe Dysphagia/Large hiatal hernia containing most of stomach - at least 3 weeks of severe  dysphagia and inability to tolerate any solid food -EGD with balloon dilation 03/15/2017: moderate Schatzki ring, large hiatal hernia - DG esophagus 7/5 showed deviation of the mid to distal esophagus around large hiatal hernia, no fixed strictures - CT 7/9 showed very large hiatal hernia containing most of the stomach, the ileocolic anastomosis, and proximal transverse colon; no obstruction -GI consulted, esophagogram shows no other acute abnormality other than the hiatal hernia. -Surgery consulted, recommends to start the patient on TPN and follow up as an outpatient to schedule correction of the hiatal hernia. -PICC line placed, started TNA and discharged home with [facility] services and plan to FU with Surgery within 1 month  2. Volume overload. Acute hypoxemic respiratory failure. Patient Was Given IV Fluids on Admission, with a Volume Overloaded, Received IV Lasix. -now improved, changed lasix to As needed, baseline BP low/soft  3. Severe malnutrition. -in setting of above, multiple admissions, chronic illnessnes -TPN and FU with Surgery in 3-4weeks  4. History of Crohn's disease. Follow-up with GI Dr.Rehman, had been getting Remicade infusion.  5. Asymptomatic bacteriuria. -no need to Rx  Consultations:  GI  [facility]  Discharge Exam: Vitals:   06/16/17 2208 06/17/17 0458  BP: 97/65 (!) 92/59  Pulse: 87 86  Resp: 18 18  Temp: 98.7 F (37.1 C) 97.7 F (36.5 C)    General: AAOx3 Cardiovascular: S1S2/RRR Respiratory: CTAB  Discharge Instructions   Discharge Instructions    Diet full liquid    Complete by:  As directed    Discharge instructions    Complete by:  As directed    It  is important that you read following instructions as well as go over your medication list with RN to help you understand your care after this hospitalization.  Discharge Instructions: Please follow-up with PCP in one week  Please request your primary care physician to go over all Hospital Tests  and Procedure/Radiological results at the follow up,  Please get all Hospital records sent to your PCP by signing hospital release before you go home.   Do not take more than prescribed Pain, Sleep and Anxiety Medications. You were cared for by a hospitalist during your hospital stay. If you have any questions about your discharge medications or the care you received while you were in the hospital after you are discharged, you can call the unit and ask to speak with the hospitalist on call if the hospitalist that took care of you is not available.  Once you are discharged, your primary care physician will handle any further medical issues. Please note that NO REFILLS for any discharge medications will be authorized once you are discharged, as it is imperative that you return to your primary care physician (or establish a relationship with a primary care physician if you do not have one) for your aftercare needs so that they can reassess your need for medications and monitor your lab values. You Must read complete instructions/literature along with all the possible adverse reactions/side effects for all the Medicines you take and that have been prescribed to you. Take any new Medicines after you have completely understood and accept all the possible adverse reactions/side effects. Wear Seat belts while driving. If you have smoked or chewed Tobacco in the last 2 yrs please stop smoking and/or stop any Recreational drug use.   Increase activity slowly    Complete by:  As directed      Current Discharge Medication List    START taking these medications   Details  capsicum oleoresin (TRIXAICIN) 0.025 % cream Apply topically 3 (three) times daily. Qty: 56.6 g, Refills: 0    feeding supplement (BOOST / RESOURCE BREEZE) LIQD Take 1 Container by mouth 3 (three) times daily between meals. Qty: 1 Container, Refills: 0      CONTINUE these medications which have CHANGED   Details  furosemide (LASIX) 20  MG tablet Take 1 tablet (20 mg total) by mouth daily as needed (Take for weight gain of more than 2 pounds in a day or 5 pounds in 2 days.). Qty: 30 tablet, Refills: 0    pantoprazole (PROTONIX) 40 MG tablet Take 1 tablet (40 mg total) by mouth daily.      CONTINUE these medications which have NOT CHANGED   Details  albuterol (PROVENTIL HFA;VENTOLIN HFA) 108 (90 Base) MCG/ACT inhaler Inhale 2 puffs into the lungs every 6 (six) hours as needed for wheezing. Qty: 1 Inhaler, Refills: 2    diphenoxylate-atropine (LOMOTIL) 2.5-0.025 MG tablet TAKE 1 TABLET BY MOUTH 4 TIMES A DAY AS NEEDED FOR DIARRHEA/LOOSE STOOLS Qty: 90 tablet, Refills: 0    HYDROcodone-acetaminophen (NORCO) 7.5-325 MG tablet Take 1 tablet by mouth every 12 (twelve) hours as needed for moderate pain.    Hydrocortisone (GERHARDT'S BUTT CREAM) CREA Apply 1 application topically 4 (four) times daily as needed for irritation.    nystatin (NYSTATIN) powder Apply 1 g topically 2 (two) times daily as needed (for itching/irritation).    promethazine (PHENERGAN) 25 MG tablet TAKE 1 TABLET (25 MG TOTAL) BY MOUTH 2 (TWO) TIMES DAILY AS NEEDED FOR NAUSEA OR VOMITING. Qty: 30  tablet, Refills: 0    saccharomyces boulardii (FLORASTOR) 250 MG capsule Take 250 mg by mouth daily.    traMADol (ULTRAM) 50 MG tablet Take 1 tablet (50 mg total) by mouth every 6 (six) hours as needed for moderate pain. Qty: 15 tablet, Refills: 0    loperamide (IMODIUM) 2 MG capsule Take 2 capsules (4 mg total) by mouth every 6 (six) hours. Qty: 30 capsule, Refills: 0    magic mouthwash w/lidocaine SOLN Take 5 mLs by mouth 4 (four) times daily. Qty: 30 mL, Refills: 0    zinc oxide 20 % ointment Apply 1 application topically 2 (two) times daily. Qty: 56.7 g, Refills: 0      STOP taking these medications     acetaminophen (TYLENOL) 325 MG tablet      Calcium Carbonate-Vitamin D (CALCIUM 600+D) 600-400 MG-UNIT tablet      colestipol (COLESTID) 1 g  tablet      cyanocobalamin (,VITAMIN B-12,) 1000 MCG/ML injection      dicyclomine (BENTYL) 10 MG/5ML syrup      inFLIXimab (REMICADE) 100 MG injection      Multiple Vitamin (MULTIVITAMIN WITH MINERALS) TABS      potassium chloride SA (K-DUR,KLOR-CON) 20 MEQ tablet      sodium bicarbonate 650 MG tablet      Vitamin D, Ergocalciferol, (DRISDOL) 50000 units CAPS capsule      ferrous sulfate 325 (65 FE) MG tablet      fluconazole (DIFLUCAN) 150 MG tablet      predniSONE (DELTASONE) 20 MG tablet        Allergies  Allergen Reactions  . Zithromax [Azithromycin] Swelling and Other (See Comments)    Reaction:  Mouth swelling and blisters   . Aspirin Other (See Comments)    Reaction:  Stomach cramping   . Ibuprofen Other (See Comments)    Pt states that she prefers not to take this medication.   Ignacia Bayley Antibiotics Nausea And Vomiting   Follow-up Information    Johnathan Hausen, MD. Call in 4 week(s).   Specialty:  General Surgery Why:  Call to make an appointment with Dr. Hassell Done to discuss surgery for hiatal hernia Contact information: [address] [place] [address] [phone]        Iona Beard, MD. Schedule an appointment as soon as possible for a visit in 1 week(s).   Specialty:  Family Medicine Contact information: [address] [place] [address] [phone]        Rogene Houston, MD. Schedule an appointment as soon as possible for a visit in 2 week(s).   Specialty:  Gastroenterology Contact information: [address], [address] [place] [address] [phone]            The results of significant diagnostics from this hospitalization (including imaging, microbiology, ancillary and laboratory) are listed below for reference.    Significant Diagnostic Studies: Ct Abdomen Pelvis W Contrast  Result Date: 06/14/2017 CLINICAL DATA:  Crohn disease and rectovaginal fistula. EXAM: CT ABDOMEN AND PELVIS WITH CONTRAST  TECHNIQUE: Multidetector CT imaging of the abdomen and pelvis was performed using the standard protocol following bolus administration of intravenous contrast. CONTRAST:  24mL ISOVUE-300 IOPAMIDOL (ISOVUE-300) INJECTION 61%, 163mL ISOVUE-300 IOPAMIDOL (ISOVUE-300) INJECTION 61% COMPARISON:  02/03/2017 FINDINGS: Lower chest: Very large hiatal hernia with nearly the entire stomach in the chest. Segment of transverse colon also extends up into the hernia. Hepatobiliary: Small area of low attenuation in the anterior  liver, adjacent to the falciform ligament, is in a characteristic location for focal fatty deposition. Gallbladder surgically absent. Stable prominence extrahepatic common duct with no dilatation of the common bile duct in the head of the pancreas. Pancreas: No focal mass lesion. No dilatation of the main duct. No intraparenchymal cyst. No peripancreatic edema. Spleen: No splenomegaly. No focal mass lesion. Adrenals/Urinary Tract: No adrenal nodule or mass. No enhancing lesion in either kidney. Kidneys are not excreting contrast by the 2 minutes renal delayed sequence raising the question of renal dysfunction. No evidence for hydroureter. Bladder is decompressed. Stomach/Bowel: As above, nearly the entire stomach is contained within a hiatal hernia. Duodenum is normally positioned as is the ligament of Treitz. No small bowel wall thickening. No small bowel dilatation. Small bowel appears foreshortened suggesting prior resection. Patient is status post right hemicolectomy. The actual ileocolic anastomosis may be in the hiatal hernia, but the bowel in the hernia so collapsed, this is difficult to assess. Sigmoid colon is draped across what appears to be the uterus (see below). The inflammation in the perianal region is similar to prior. Vascular/Lymphatic: No abdominal aortic aneurysm. There is no gastrohepatic or hepatoduodenal ligament lymphadenopathy. No intraperitoneal or retroperitoneal lymphadenopathy.  No pelvic sidewall lymphadenopathy. Reproductive: There is more soft tissue cranial to the vagina than expected in the setting of hysterectomy. As such, and given that no surgical history of hysterectomy is identified in EPIC, this soft tissue is presumably the body and fundus of the uterus. The sigmoid colon is draped across the left aspect of the presumed fundus but there is no wall thickening in this segment of the colon. No direct contact between colon or small bowel and the vagina is evident. Other: No intraperitoneal free fluid. Musculoskeletal: Bone windows reveal no worrisome lytic or sclerotic osseous lesions. Mild compression deformity noted at the level of L1. IMPRESSION: 1. Similar perianal stranding and inflammation with potential tiny fistulous tract between the anus and the skin. This edema/inflammation also tracks centrally towards the vaginal introitus. 2. Very large hiatal hernia containing most of the stomach, the ileocolic anastomosis, and proximal transverse colon. No evidence for obstruction. 3. Stable appearance compression deformity at L1. Electronically Signed   By: Misty Stanley M.D.   On: 06/14/2017 20:39   Dg Esophagus  Result Date: 06/10/2017 CLINICAL DATA:  Dysphagia EXAM: ESOPHOGRAM/BARIUM SWALLOW TECHNIQUE: Single contrast examination was performed using  thin barium. FLUOROSCOPY TIME:  Fluoroscopy Time:  1 minutes 6 seconds Radiation Exposure Index (if provided by the fluoroscopic device): 2.9 mGy Number of Acquired Spot Images: 0 COMPARISON:  CT chest 05/08/2017 FINDINGS: Limited study as the patient would only take small sips, min refused to drink more prior to completing the study. There is a large hiatal hernia. The esophagus deviates to the right around the large hiatal hernia. There are no fixed esophageal strictures or visible masses. IMPRESSION: Limited study as above. Deviation of the mid to distal esophagus around a large hiatal hernia. No fixed strictures.  Electronically Signed   By: Rolm Baptise M.D.   On: 06/10/2017 11:01   Dg Chest Port 1 View  Result Date: 06/12/2017 CLINICAL DATA:  Shortness of breath today EXAM: PORTABLE CHEST 1 VIEW COMPARISON:  06/08/2017 FINDINGS: Right PICC line tip:  SVC. Very large hiatal hernia with mixed density and lucency projecting over the cardiac shadow. Moderate enlargement of the cardiopericardial silhouette without edema. Old bilateral rib deformities from old fractures. Bony demineralization. Suspected thoracic kyphosis. The patient's chin obscures the medial  lung apices bilaterally. No blunting of the costophrenic angles. Aside from presume passive atelectasis related to the hiatal hernia, the lungs appear clear. IMPRESSION: 1. Very large hiatal hernia, likely containing most of the stomach. 2. Moderate enlargement of the cardiopericardial silhouette, without edema. 3. PICC line tip: SVC. 4. Bony demineralization with old bilateral healed rib fractures. Electronically Signed   By: Van Clines M.D.   On: 06/12/2017 10:07   Dg Abd Acute W/chest  Result Date: 06/08/2017 CLINICAL DATA:  Nausea, vomiting and diarrhea EXAM: DG ABDOMEN ACUTE W/ 1V CHEST COMPARISON:  Chest CT from 05/08/2017 FINDINGS: Previously noted herniation of stomach and portions of small bowel are again identified projecting over the top normal size cardiac silhouette. The lungs are clear. Old right-sided rib fractures involving the right fourth through eighth ribs are noted. The patient is status post cholecystectomy. The bowel gas pattern is unremarkable. Several sutures are also seen in the right mid abdomen. No free air or bowel obstruction is identified. IMPRESSION: 1. Large hiatal hernia again noted projecting over the cardiac and mediastinal contours. 2. No bowel obstruction. 3. No acute cardiopulmonary disease. Electronically Signed   By: Ashley Royalty M.D.   On: 06/08/2017 17:27    Microbiology: Recent Results (from the past 240 hour(s))   Urine Culture     Status: Abnormal   Collection Time: 06/08/17  3:39 PM  Result Value Ref Range Status   Specimen Description URINE, RANDOM  Final   Special Requests NONE  Final   Culture >=100,000 COLONIES/mL KLEBSIELLA PNEUMONIAE (A)  Final   Report Status 06/11/2017 FINAL  Final   Organism ID, Bacteria KLEBSIELLA PNEUMONIAE (A)  Final      Susceptibility   Klebsiella pneumoniae - MIC*    AMPICILLIN >=32 RESISTANT Resistant     CEFAZOLIN <=4 SENSITIVE Sensitive     CEFTRIAXONE <=1 SENSITIVE Sensitive     CIPROFLOXACIN <=0.25 SENSITIVE Sensitive     GENTAMICIN <=1 SENSITIVE Sensitive     IMIPENEM <=0.25 SENSITIVE Sensitive     NITROFURANTOIN 64 INTERMEDIATE Intermediate     TRIMETH/SULFA <=20 SENSITIVE Sensitive     AMPICILLIN/SULBACTAM <=2 SENSITIVE Sensitive     PIP/TAZO <=4 SENSITIVE Sensitive     Extended ESBL NEGATIVE Sensitive     * >=100,000 COLONIES/mL KLEBSIELLA PNEUMONIAE  MRSA PCR Screening     Status: None   Collection Time: 06/09/17  9:15 AM  Result Value Ref Range Status   MRSA by PCR NEGATIVE NEGATIVE Final    Comment:        The GeneXpert MRSA Assay (FDA approved for NASAL specimens only), is one component of a comprehensive MRSA colonization surveillance program. It is not intended to diagnose MRSA infection nor to guide or monitor treatment for MRSA infections.      Labs: Basic Metabolic Panel:  Recent Labs Lab 06/12/17 0937 06/13/17 1005 06/14/17 0411 06/15/17 0412 06/16/17 0445 06/17/17 0353  NA 140 143 142 139 138 138  K 4.2 3.3* 3.0* 3.4* 3.4* 3.0*  CL 126* 129* 124* 123* 121* 119*  CO2 <7* 8* 9* 7* 8* 10*  GLUCOSE 115* 85 79 73 77 102*  BUN 6 7 6  5* <5* 10  CREATININE 0.93 0.81 0.92 0.86 0.84 0.85  CALCIUM 7.6* 7.8* 7.6* 7.7* 8.0* 8.0*  MG 1.6* 1.5*  --  1.2* 1.9 1.6*  PHOS 2.5 3.4  --  3.2 3.2 3.7   Liver Function Tests:  Recent Labs Lab 06/12/17 5643 06/13/17 1005 06/15/17 0412 06/16/17 0445  06/17/17 0353  AST 21 23 24  26 23   ALT 15 16 19 22 21   ALKPHOS 98 91 91 108 101  BILITOT 0.2* 0.1* 0.4 0.9 0.3  PROT 6.4* 5.8* 5.6* 6.8 6.4*  ALBUMIN 2.7* 2.5* 2.4* 3.0* 2.8*   No results for input(s): LIPASE, AMYLASE in the last 168 hours. No results for input(s): AMMONIA in the last 168 hours. CBC:  Recent Labs Lab 06/11/17 0419 06/12/17 0930 06/14/17 0411 06/15/17 0829 06/16/17 0445  WBC 9.1 13.6* 7.7 7.7 10.1  NEUTROABS 2.5  --  3.0 2.0 3.6  HGB 8.5* 9.0* 7.5* 7.3* 10.7*  HCT 25.8* 27.9* 22.3* 22.1* 32.0*  MCV 81.9 83.0 79.9 79.8 79.8  PLT 137* 144* 123* 126* 135*   Cardiac Enzymes: No results for input(s): CKTOTAL, CKMB, CKMBINDEX, TROPONINI in the last 168 hours. BNP: BNP (last 3 results)  Recent Labs  05/07/17 0024  BNP 49.3    ProBNP (last 3 results) No results for input(s): PROBNP in the last 8760 hours.  CBG:  Recent Labs Lab 06/16/17 0619 06/16/17 1306 06/17/17 0011 06/17/17 0538  GLUCAP 100* 126* 114* 95       Signed:  Domenic Polite MD.  [facility] 06/17/2017, 11:59 AM

## 2017-06-17 NOTE — Progress Notes (Signed)
Pt is covered by insurance for TPN.

## 2017-06-17 NOTE — Progress Notes (Signed)
Physical Therapy Treatment Patient Details Name: Danielle [name] MRN: [id number] DOB: February 15, 1946 Today's Date: 06/17/2017    History of Present Illness Pt is a 71 yo female admitted for nausea and vomiting; Esophageogram 7/5 eventually done confirmed large hiatal hernia.  Pt has a history of GERD, Crohn's disease, previous left lacunar infarction, incision and drainage of perirectal abscess    PT Comments    Assisted OOB to amb a greater distance using RW although pt did not want to.  Stated she uses a cane at home but since there was none here, did agree to walk with walker.  Pt plans to D/C to home with sister.    Follow Up Recommendations  Home health PT;Supervision for mobility/OOB     Equipment Recommendations  Rolling walker with 5" wheels    Recommendations for Other Services       Precautions / Restrictions Precautions Precautions: Fall Restrictions Weight Bearing Restrictions: No    Mobility  Bed Mobility Overal bed mobility: Needs Assistance Bed Mobility: Supine to Sit;Sit to Supine     Supine to sit: Supervision;HOB elevated Sit to supine: Supervision;HOB elevated   General bed mobility comments: supervision for lines  Transfers Overall transfer level: Needs assistance Equipment used: None Transfers: Sit to/from [employer] Transfers Sit to Stand: Min guard;Supervision Stand pivot transfers: Supervision;Min guard       General transfer comment: min/guard for safety, pt utilized bed rail and armrests for self steadying/assist, declined RW  Ambulation/Gait Ambulation/Gait assistance: Min guard Ambulation Distance (Feet): 55 Feet Assistive device: Rolling walker (2 wheeled) Gait Pattern/deviations: Step-to pattern;Step-through pattern Gait velocity: WFL   General Gait Details: had pt use RW just for safety.  Uses a cane at home.  Tolerated an increased distance.   Stairs            Wheelchair Mobility    Modified Rankin (Stroke  Patients Only)       Balance                                            Cognition Arousal/Alertness: Awake/alert Behavior During Therapy: WFL for tasks assessed/performed Overall Cognitive Status: Within Functional Limits for tasks assessed                                        Exercises      General Comments        Pertinent Vitals/Pain Pain Assessment: No/denies pain    Home Living                      Prior Function            PT Goals (current goals can now be found in the care plan section) Progress towards PT goals: Progressing toward goals    Frequency    Min 2X/week      PT Plan Current plan remains appropriate    Co-evaluation              AM-PAC PT "6 Clicks" Daily Activity  Outcome Measure    Difficulty moving from lying on back to sitting on the side of the bed? : A Little Difficulty sitting down on and standing up from a chair with arms (e.g., wheelchair, bedside commode, etc,.)?: A Lot Help needed  moving to and from a bed to chair (including a wheelchair)?: A Little Help needed walking in hospital room?: A Little Help needed climbing 3-5 steps with a railing? : A Lot 6 Click Score: 13    End of Session Equipment Utilized During Treatment: Gait belt Activity Tolerance: Patient tolerated treatment well Patient left: in bed;with bed alarm set;with family/visitor present Nurse Communication: Mobility status PT Visit Diagnosis: Difficulty in walking, not elsewhere classified (R26.2)     Time: 3710-6269 PT Time Calculation (min) (ACUTE ONLY): 11 min  Charges:  $Gait Training: 8-22 mins                    G Codes:       Rica Koyanagi  PTA [facility]  Acute  Rehab Pager      [phone]

## 2017-06-17 NOTE — Progress Notes (Signed)
Nutrition Follow-up  DOCUMENTATION CODES:   Severe malnutrition in context of chronic illness  INTERVENTION:   Monitor magnesium, potassium, and phosphorus daily for at least 3 days, MD to replete as needed, as pt is at risk for refeeding syndrome given severe malnutrition and poor PO intake.  TPN per Pharmacy Continue Boost Breeze po TID, each supplement provides 250 kcal and 9 grams of protein  RD will continue to monitor  NUTRITION DIAGNOSIS:   Malnutrition(severe) related to dysphagia as evidenced by severe depletion of body fat, severe depletion of muscle mass, percent weight loss.  Ongoing.  GOAL:   Patient will meet greater than or equal to 90% of their needs  Progressing.  MONITOR:   PO intake, Supplement acceptance, Labs, Weight trends, I & O's (TPN)  ASSESSMENT:   71 y.o. female with medical history significant of psoriasis, Crohn's disease (followed by GI, Dr. Laural Golden on remicaide infusion), hiatal hernia, GERD, CKD stage III, and H/O rectovaginal fistula; who presents with complaints of vomiting. Patient was just recently admitted into the hospital 5/31-6/8 sepsis secondary to urinary tract infection and possible Crohn's flare. She had just had a recent perirectal abscess I&D by Dr. Rush Farmer on 5/16. Since being home patient reports having poor oral appetite.  Patient with PICC placed and starting TPN now. Per chart, pending insurance approval to go home on TPN.  Currently receiving Clinimix E 5/15 @ 40 ml/hr with ILE @ 10 ml/hr x 12 hrs. Pharmacy slowly advancing given refeeding risk.   Pt continues to drink Boost Breeze supplements. Has not had a supplement today so far.  Medications: Protonix tablet daily, KCl solution twice today Labs reviewed: Low K, Mg Phos WNL  Plan per Pharmacy 7/12: Mag 2 gm and kcl 60 meq po supplement  (40 +20)         If patient is not discharged, at 1800 today:  Increase Clinimix E 5/15 to 50 ml/hr (slowly due to risk of  refeeding syndrome and since TPN not started until 05:30 this morning).  20% fat emulsion at 10 ml/hr over 12 hrs.  Plan to advance as tolerated to the goal rate of 70 ml/hr  Diet Order:  Diet full liquid Room service appropriate? Yes; Fluid consistency: Thin Diet full liquid TPN (CLINIMIX-E) Adult  Skin:  Reviewed, no issues  Last BM:  7/11  Height:   Ht Readings from Last 1 Encounters:  06/10/17 5\' 4"  (1.626 m)    Weight:   Wt Readings from Last 1 Encounters:  06/14/17 115 lb 8.3 oz (52.4 kg)    Ideal Body Weight:  54.5 kg  BMI:  Body mass index is 19.83 kg/m.  Estimated Nutritional Needs:   Kcal:  1550-1750kcal/day   Protein:  78-88g/day   Fluid:  >1.5L/day   EDUCATION NEEDS:   No education needs identified at this time  Clayton Bibles, MS, RD, LDN Pager: [phone] After Hours Pager: [phone]

## 2017-06-22 DIAGNOSIS — N390 Urinary tract infection, site not specified: Secondary | ICD-10-CM | POA: Diagnosis not present

## 2017-06-22 DIAGNOSIS — Z452 Encounter for adjustment and management of vascular access device: Secondary | ICD-10-CM | POA: Diagnosis not present

## 2017-06-22 DIAGNOSIS — K5090 Crohn's disease, unspecified, without complications: Secondary | ICD-10-CM | POA: Diagnosis not present

## 2017-06-22 DIAGNOSIS — K219 Gastro-esophageal reflux disease without esophagitis: Secondary | ICD-10-CM | POA: Diagnosis not present

## 2017-06-22 DIAGNOSIS — R1310 Dysphagia, unspecified: Secondary | ICD-10-CM | POA: Diagnosis not present

## 2017-06-22 DIAGNOSIS — L409 Psoriasis, unspecified: Secondary | ICD-10-CM | POA: Diagnosis not present

## 2017-06-22 DIAGNOSIS — R112 Nausea with vomiting, unspecified: Secondary | ICD-10-CM | POA: Diagnosis not present

## 2017-06-22 DIAGNOSIS — D649 Anemia, unspecified: Secondary | ICD-10-CM | POA: Diagnosis not present

## 2017-06-22 DIAGNOSIS — N823 Fistula of vagina to large intestine: Secondary | ICD-10-CM | POA: Diagnosis not present

## 2017-06-22 DIAGNOSIS — N183 Chronic kidney disease, stage 3 (moderate): Secondary | ICD-10-CM | POA: Diagnosis not present

## 2017-06-22 DIAGNOSIS — K449 Diaphragmatic hernia without obstruction or gangrene: Secondary | ICD-10-CM | POA: Diagnosis not present

## 2017-06-22 DIAGNOSIS — E43 Unspecified severe protein-calorie malnutrition: Secondary | ICD-10-CM | POA: Diagnosis not present

## 2017-06-23 DIAGNOSIS — N390 Urinary tract infection, site not specified: Secondary | ICD-10-CM | POA: Diagnosis not present

## 2017-06-23 DIAGNOSIS — R112 Nausea with vomiting, unspecified: Secondary | ICD-10-CM | POA: Diagnosis not present

## 2017-06-23 DIAGNOSIS — N823 Fistula of vagina to large intestine: Secondary | ICD-10-CM | POA: Diagnosis not present

## 2017-06-23 DIAGNOSIS — E43 Unspecified severe protein-calorie malnutrition: Secondary | ICD-10-CM | POA: Diagnosis not present

## 2017-06-24 DIAGNOSIS — N823 Fistula of vagina to large intestine: Secondary | ICD-10-CM | POA: Diagnosis not present

## 2017-06-24 DIAGNOSIS — R112 Nausea with vomiting, unspecified: Secondary | ICD-10-CM | POA: Diagnosis not present

## 2017-06-24 DIAGNOSIS — E43 Unspecified severe protein-calorie malnutrition: Secondary | ICD-10-CM | POA: Diagnosis not present

## 2017-06-24 DIAGNOSIS — N390 Urinary tract infection, site not specified: Secondary | ICD-10-CM | POA: Diagnosis not present

## 2017-06-28 ENCOUNTER — Other Ambulatory Visit (INDEPENDENT_AMBULATORY_CARE_PROVIDER_SITE_OTHER): Payer: Self-pay | Admitting: Internal Medicine

## 2017-06-28 DIAGNOSIS — R1310 Dysphagia, unspecified: Secondary | ICD-10-CM | POA: Diagnosis not present

## 2017-06-28 DIAGNOSIS — E43 Unspecified severe protein-calorie malnutrition: Secondary | ICD-10-CM | POA: Diagnosis not present

## 2017-06-28 DIAGNOSIS — K50919 Crohn's disease, unspecified, with unspecified complications: Secondary | ICD-10-CM | POA: Diagnosis not present

## 2017-06-28 DIAGNOSIS — D649 Anemia, unspecified: Secondary | ICD-10-CM | POA: Diagnosis not present

## 2017-06-28 DIAGNOSIS — N183 Chronic kidney disease, stage 3 (moderate): Secondary | ICD-10-CM | POA: Diagnosis not present

## 2017-06-28 DIAGNOSIS — R32 Unspecified urinary incontinence: Secondary | ICD-10-CM | POA: Diagnosis not present

## 2017-06-28 DIAGNOSIS — K5090 Crohn's disease, unspecified, without complications: Secondary | ICD-10-CM | POA: Diagnosis not present

## 2017-06-28 DIAGNOSIS — L409 Psoriasis, unspecified: Secondary | ICD-10-CM | POA: Diagnosis not present

## 2017-06-28 DIAGNOSIS — Z452 Encounter for adjustment and management of vascular access device: Secondary | ICD-10-CM | POA: Diagnosis not present

## 2017-06-28 DIAGNOSIS — K580 Irritable bowel syndrome with diarrhea: Secondary | ICD-10-CM | POA: Diagnosis not present

## 2017-06-28 DIAGNOSIS — K219 Gastro-esophageal reflux disease without esophagitis: Secondary | ICD-10-CM | POA: Diagnosis not present

## 2017-06-28 DIAGNOSIS — K449 Diaphragmatic hernia without obstruction or gangrene: Secondary | ICD-10-CM | POA: Diagnosis not present

## 2017-06-29 DIAGNOSIS — E43 Unspecified severe protein-calorie malnutrition: Secondary | ICD-10-CM | POA: Diagnosis not present

## 2017-06-29 DIAGNOSIS — N183 Chronic kidney disease, stage 3 (moderate): Secondary | ICD-10-CM | POA: Diagnosis not present

## 2017-06-29 DIAGNOSIS — L409 Psoriasis, unspecified: Secondary | ICD-10-CM | POA: Diagnosis not present

## 2017-06-29 DIAGNOSIS — K5090 Crohn's disease, unspecified, without complications: Secondary | ICD-10-CM | POA: Diagnosis not present

## 2017-06-29 DIAGNOSIS — D649 Anemia, unspecified: Secondary | ICD-10-CM | POA: Diagnosis not present

## 2017-06-29 DIAGNOSIS — Z452 Encounter for adjustment and management of vascular access device: Secondary | ICD-10-CM | POA: Diagnosis not present

## 2017-06-29 DIAGNOSIS — R1310 Dysphagia, unspecified: Secondary | ICD-10-CM | POA: Diagnosis not present

## 2017-06-29 DIAGNOSIS — K219 Gastro-esophageal reflux disease without esophagitis: Secondary | ICD-10-CM | POA: Diagnosis not present

## 2017-06-29 DIAGNOSIS — K449 Diaphragmatic hernia without obstruction or gangrene: Secondary | ICD-10-CM | POA: Diagnosis not present

## 2017-07-01 ENCOUNTER — Encounter (INDEPENDENT_AMBULATORY_CARE_PROVIDER_SITE_OTHER): Payer: Self-pay | Admitting: Internal Medicine

## 2017-07-01 ENCOUNTER — Ambulatory Visit (INDEPENDENT_AMBULATORY_CARE_PROVIDER_SITE_OTHER): Payer: Medicare HMO | Admitting: Internal Medicine

## 2017-07-01 VITALS — BP 108/70 | HR 72 | Temp 98.4°F | Resp 18 | Ht 64.5 in | Wt 125.9 lb

## 2017-07-01 DIAGNOSIS — R634 Abnormal weight loss: Secondary | ICD-10-CM | POA: Diagnosis not present

## 2017-07-01 DIAGNOSIS — L409 Psoriasis, unspecified: Secondary | ICD-10-CM | POA: Diagnosis not present

## 2017-07-01 DIAGNOSIS — K219 Gastro-esophageal reflux disease without esophagitis: Secondary | ICD-10-CM | POA: Diagnosis not present

## 2017-07-01 DIAGNOSIS — R1310 Dysphagia, unspecified: Secondary | ICD-10-CM | POA: Diagnosis not present

## 2017-07-01 DIAGNOSIS — R110 Nausea: Secondary | ICD-10-CM | POA: Diagnosis not present

## 2017-07-01 DIAGNOSIS — N183 Chronic kidney disease, stage 3 (moderate): Secondary | ICD-10-CM | POA: Diagnosis not present

## 2017-07-01 DIAGNOSIS — N823 Fistula of vagina to large intestine: Secondary | ICD-10-CM | POA: Diagnosis not present

## 2017-07-01 DIAGNOSIS — N390 Urinary tract infection, site not specified: Secondary | ICD-10-CM | POA: Diagnosis not present

## 2017-07-01 DIAGNOSIS — D649 Anemia, unspecified: Secondary | ICD-10-CM | POA: Diagnosis not present

## 2017-07-01 DIAGNOSIS — E43 Unspecified severe protein-calorie malnutrition: Secondary | ICD-10-CM | POA: Diagnosis not present

## 2017-07-01 DIAGNOSIS — K5090 Crohn's disease, unspecified, without complications: Secondary | ICD-10-CM | POA: Diagnosis not present

## 2017-07-01 DIAGNOSIS — K449 Diaphragmatic hernia without obstruction or gangrene: Secondary | ICD-10-CM | POA: Diagnosis not present

## 2017-07-01 DIAGNOSIS — K50813 Crohn's disease of both small and large intestine with fistula: Secondary | ICD-10-CM | POA: Diagnosis not present

## 2017-07-01 DIAGNOSIS — R112 Nausea with vomiting, unspecified: Secondary | ICD-10-CM | POA: Diagnosis not present

## 2017-07-01 DIAGNOSIS — Z452 Encounter for adjustment and management of vascular access device: Secondary | ICD-10-CM | POA: Diagnosis not present

## 2017-07-01 MED ORDER — PROMETHAZINE HCL 25 MG PO TABS: 25.0000 mg | ORAL_TABLET | Freq: Two times a day (BID) | ORAL | 0 refills | Status: DC | PRN

## 2017-07-01 NOTE — Patient Instructions (Signed)
Please ask lab personnel to send copy of your next blood work

## 2017-07-01 NOTE — Progress Notes (Signed)
Presenting complaint;  Follow-up for Crohn's disease GERD and weight loss.  Subjective:  Patient is 71 year old African-American female with multiple medical problems including Crohn's disease involving small and large bowel as well as fistulas and disease chronic GERD with large hiatal hernia iron deficiency anemia as well as hypokalemia and chronic kidney disease. She was last seen on 04/15/2017 when she weighed 133 pounds. She had been losing weight. She had no appetite. She had EGD and colonoscopy on 03/12/2017. EGD revealed a ring at GE junction which was dilated with a balloon to 18 mm and was disrupted. She was noted to have large sliding hiatal hernia. 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 She also had mild active disease involving anorectal region. She has been maintained on infliximab. On her last visit I recommended Megace which she had taken the past with good results. However this medication could not be approved.  In the meantime she was admitted to [facility] on 06/08/2017 with nausea vomiting and dysphagia. She was also felt to have uterine tract infection hypokalemia and anemia. She weighed 114 pounds. She was felt to be severely malnourished. She was begun on parenteral nutrition. She is continuing this at home. She is getting TPN every night. While in hospital she had barium study which revealed very large hiatal hernia pushing esophagus to the site but no obvious stricture. Surgical consultation was obtained. Dr. Johnathan Hausen recommended laparoscopic surgery. It was decided to delay surgery because of malnutrition. With TPN she start to feel better. She was discharged on 06/17/2017. She states she has gained 11 pounds since discharge. She states she has had very good appetite for the last 10 days. She denies nausea vomiting heartburn dysphagia melena or rectal bleeding. She is having 2-3 bowel movements per day. Most of her  stools are semi-formed. Today she feels gassy. She she says hospitalist ask her why she was on spironolactone. She is on spironolactone because of chronic hypokalemia which has been difficult to control with oral supplement. She says while she is a hospital she developed lower extremity edema but this is improving. New prescription for Phenergan as ondansetron usually does not work. She states she does not want to have anti-reflex surgery.   Current Medications: Outpatient Encounter Prescriptions as of 07/01/2017  Medication Sig  . albuterol (PROVENTIL HFA;VENTOLIN HFA) 108 (90 Base) MCG/ACT inhaler Inhale 2 puffs into the lungs every 6 (six) hours as needed for wheezing.  . capsicum oleoresin (TRIXAICIN) 0.025 % cream Apply topically 3 (three) times daily.  . diphenoxylate-atropine (LOMOTIL) 2.5-0.025 MG tablet TAKE 1 TABLET BY MOUTH FOUR TIMES A DAY AS NEEDED  . feeding supplement (BOOST / RESOURCE BREEZE) LIQD Take 1 Container by mouth 3 (three) times daily between meals.  . furosemide (LASIX) 20 MG tablet Take 1 tablet (20 mg total) by mouth daily as needed (Take for weight gain of more than 2 pounds in a day or 5 pounds in 2 days.).  [name] HYDROcodone-acetaminophen (NORCO) 7.5-325 MG tablet Take 1 tablet by mouth every 12 (twelve) hours as needed for moderate pain.  [name] Hydrocortisone (GERHARDT'S BUTT CREAM) CREA Apply 1 application topically 4 (four) times daily as needed for irritation.  [name] nystatin (NYSTATIN) powder Apply 1 g topically 2 (two) times daily as needed (for itching/irritation).  . pantoprazole (PROTONIX) 40 MG tablet Take 1 tablet (40 mg total) by mouth daily.  . promethazine (PHENERGAN) 25 MG tablet TAKE 1 TABLET (25 MG TOTAL) BY MOUTH 2 (TWO)  TIMES DAILY AS NEEDED FOR NAUSEA OR VOMITING.  . saccharomyces boulardii (FLORASTOR) 250 MG capsule Take 250 mg by mouth daily.  . Simethicone (PHAZYME PO) Take by mouth as needed.  . sodium bicarbonate 650 MG tablet TAKE 1 TABLET (650 MG  TOTAL) BY MOUTH 2 (TWO) TIMES DAILY.  [name] spironolactone (ALDACTONE) 50 MG tablet Take 50 mg by mouth daily.   . [DISCONTINUED] loperamide (IMODIUM) 2 MG capsule Take 2 capsules (4 mg total) by mouth every 6 (six) hours. (Patient not taking: Reported on 07/01/2017)  . [DISCONTINUED] magic mouthwash w/lidocaine SOLN Take 5 mLs by mouth 4 (four) times daily. (Patient not taking: Reported on 06/08/2017)  . [DISCONTINUED] traMADol (ULTRAM) 50 MG tablet Take 1 tablet (50 mg total) by mouth every 6 (six) hours as needed for moderate pain. (Patient not taking: Reported on 07/01/2017)  . [DISCONTINUED] zinc oxide 20 % ointment Apply 1 application topically 2 (two) times daily. (Patient not taking: Reported on 06/08/2017)   No facility-administered encounter medications on file as of 07/01/2017.      Objective: Blood pressure 108/70, pulse 72, temperature 98.4 F (36.9 C), temperature source Oral, resp. rate 18, height 5' 4.5" (1.638 m), weight 125 lb 14.4 oz (57.1 kg). Patient is alert and in no acute distress.  Conjunctiva is pink. Sclera is nonicteric Oropharyngeal mucosa is normal. No neck masses or thyromegaly noted. Cardiac exam with regular rhythm normal S1 and S2. No murmur or gallop noted. Lungs are clear to auscultation. Abdomen is full. Bowel sounds are normal. Percussion noticed tympanitic. No organomegaly or masses noted. 1+ pitting edema noted to both legs.  Labs/studies Results:   lab data from last week is not available.   Assessment:  #1. Crohn's disease. She appears to be doing well on infliximab.   #2. Weight loss. It is interesting to note that her appetite has improved great deal since recent hospitalization. This is in spite of the fact that she is getting more most of her calories with TPN. She possibly will need TPN until she's had full recovery from antireflux surgery.   #3.Large hiatal hernia. More than 50% of her stomach is in the chest. I'm afraid as a time goes by she will  have more problems. Therefore I would recommend she should proceed with surgery as recommended by Dr. Johnathan Hausen when nutritional status improves. Hopefully surgery can be performed laparoscopically.  #4. Nausea without vomiting. Nausea is most likely secondary to large hiatal hernia with stasis   Plan:  New prescription given for Phenergan 25 mg by mouth twice a day when necessary 20 without refill. Patient will ask lab personnel to send me before next blood work. Patient advised to maintain her posterior otherwise she will develop kyphosis. Office visit in 3 months.

## 2017-07-04 DIAGNOSIS — D649 Anemia, unspecified: Secondary | ICD-10-CM | POA: Diagnosis not present

## 2017-07-04 DIAGNOSIS — K5090 Crohn's disease, unspecified, without complications: Secondary | ICD-10-CM | POA: Diagnosis not present

## 2017-07-04 DIAGNOSIS — R1310 Dysphagia, unspecified: Secondary | ICD-10-CM | POA: Diagnosis not present

## 2017-07-04 DIAGNOSIS — K219 Gastro-esophageal reflux disease without esophagitis: Secondary | ICD-10-CM | POA: Diagnosis not present

## 2017-07-04 DIAGNOSIS — L409 Psoriasis, unspecified: Secondary | ICD-10-CM | POA: Diagnosis not present

## 2017-07-04 DIAGNOSIS — N183 Chronic kidney disease, stage 3 (moderate): Secondary | ICD-10-CM | POA: Diagnosis not present

## 2017-07-04 DIAGNOSIS — K449 Diaphragmatic hernia without obstruction or gangrene: Secondary | ICD-10-CM | POA: Diagnosis not present

## 2017-07-04 DIAGNOSIS — Z452 Encounter for adjustment and management of vascular access device: Secondary | ICD-10-CM | POA: Diagnosis not present

## 2017-07-04 DIAGNOSIS — E43 Unspecified severe protein-calorie malnutrition: Secondary | ICD-10-CM | POA: Diagnosis not present

## 2017-07-05 DIAGNOSIS — K219 Gastro-esophageal reflux disease without esophagitis: Secondary | ICD-10-CM | POA: Diagnosis not present

## 2017-07-05 DIAGNOSIS — R1310 Dysphagia, unspecified: Secondary | ICD-10-CM | POA: Diagnosis not present

## 2017-07-05 DIAGNOSIS — L409 Psoriasis, unspecified: Secondary | ICD-10-CM | POA: Diagnosis not present

## 2017-07-05 DIAGNOSIS — D649 Anemia, unspecified: Secondary | ICD-10-CM | POA: Diagnosis not present

## 2017-07-05 DIAGNOSIS — K449 Diaphragmatic hernia without obstruction or gangrene: Secondary | ICD-10-CM | POA: Diagnosis not present

## 2017-07-05 DIAGNOSIS — K5090 Crohn's disease, unspecified, without complications: Secondary | ICD-10-CM | POA: Diagnosis not present

## 2017-07-05 DIAGNOSIS — E43 Unspecified severe protein-calorie malnutrition: Secondary | ICD-10-CM | POA: Diagnosis not present

## 2017-07-05 DIAGNOSIS — Z452 Encounter for adjustment and management of vascular access device: Secondary | ICD-10-CM | POA: Diagnosis not present

## 2017-07-05 DIAGNOSIS — N183 Chronic kidney disease, stage 3 (moderate): Secondary | ICD-10-CM | POA: Diagnosis not present

## 2017-07-06 ENCOUNTER — Other Ambulatory Visit (INDEPENDENT_AMBULATORY_CARE_PROVIDER_SITE_OTHER): Payer: Self-pay | Admitting: Internal Medicine

## 2017-07-07 ENCOUNTER — Encounter (HOSPITAL_COMMUNITY): Payer: Self-pay

## 2017-07-07 ENCOUNTER — Encounter (HOSPITAL_COMMUNITY)
Admission: RE | Admit: 2017-07-07 | Discharge: 2017-07-07 | Disposition: A | Discharge status: Home / Self Care | Payer: Medicare HMO | Source: Ambulatory Visit | Attending: Internal Medicine | Admitting: Internal Medicine

## 2017-07-07 DIAGNOSIS — K5090 Crohn's disease, unspecified, without complications: Secondary | ICD-10-CM | POA: Diagnosis not present

## 2017-07-07 MED ORDER — SODIUM CHLORIDE 0.9 % IV SOLN
INTRAVENOUS | Status: DC
  Administered 2017-07-07: 10:00:00 via INTRAVENOUS

## 2017-07-07 MED ORDER — LORATADINE 10 MG PO TABS
10.0000 mg | ORAL_TABLET | Freq: Once | ORAL | Status: AC
  Administered 2017-07-07: 10 mg via ORAL
  Filled 2017-07-07: qty 1

## 2017-07-07 MED ORDER — ACETAMINOPHEN 325 MG PO TABS
650.0000 mg | ORAL_TABLET | Freq: Once | ORAL | Status: AC
  Administered 2017-07-07: 650 mg via ORAL
  Filled 2017-07-07: qty 2

## 2017-07-07 MED ORDER — SODIUM CHLORIDE 0.9 % IV SOLN
5.0000 mg/kg | Freq: Once | INTRAVENOUS | Status: AC
  Administered 2017-07-07: 300 mg via INTRAVENOUS
  Filled 2017-07-07: qty 30

## 2017-07-08 ENCOUNTER — Ambulatory Visit (INDEPENDENT_AMBULATORY_CARE_PROVIDER_SITE_OTHER): Payer: Medicare HMO | Admitting: Internal Medicine

## 2017-07-08 DIAGNOSIS — E43 Unspecified severe protein-calorie malnutrition: Secondary | ICD-10-CM | POA: Diagnosis not present

## 2017-07-08 DIAGNOSIS — N390 Urinary tract infection, site not specified: Secondary | ICD-10-CM | POA: Diagnosis not present

## 2017-07-08 DIAGNOSIS — N823 Fistula of vagina to large intestine: Secondary | ICD-10-CM | POA: Diagnosis not present

## 2017-07-08 DIAGNOSIS — R112 Nausea with vomiting, unspecified: Secondary | ICD-10-CM | POA: Diagnosis not present

## 2017-07-12 ENCOUNTER — Other Ambulatory Visit (INDEPENDENT_AMBULATORY_CARE_PROVIDER_SITE_OTHER): Payer: Self-pay | Admitting: Internal Medicine

## 2017-07-12 DIAGNOSIS — K5090 Crohn's disease, unspecified, without complications: Secondary | ICD-10-CM | POA: Diagnosis not present

## 2017-07-12 DIAGNOSIS — K219 Gastro-esophageal reflux disease without esophagitis: Secondary | ICD-10-CM | POA: Diagnosis not present

## 2017-07-12 DIAGNOSIS — N183 Chronic kidney disease, stage 3 (moderate): Secondary | ICD-10-CM | POA: Diagnosis not present

## 2017-07-12 DIAGNOSIS — L409 Psoriasis, unspecified: Secondary | ICD-10-CM | POA: Diagnosis not present

## 2017-07-12 DIAGNOSIS — Z452 Encounter for adjustment and management of vascular access device: Secondary | ICD-10-CM | POA: Diagnosis not present

## 2017-07-12 DIAGNOSIS — E43 Unspecified severe protein-calorie malnutrition: Secondary | ICD-10-CM | POA: Diagnosis not present

## 2017-07-12 DIAGNOSIS — K449 Diaphragmatic hernia without obstruction or gangrene: Secondary | ICD-10-CM | POA: Diagnosis not present

## 2017-07-12 DIAGNOSIS — R1310 Dysphagia, unspecified: Secondary | ICD-10-CM | POA: Diagnosis not present

## 2017-07-12 DIAGNOSIS — D649 Anemia, unspecified: Secondary | ICD-10-CM | POA: Diagnosis not present

## 2017-07-16 DIAGNOSIS — R112 Nausea with vomiting, unspecified: Secondary | ICD-10-CM | POA: Diagnosis not present

## 2017-07-16 DIAGNOSIS — E43 Unspecified severe protein-calorie malnutrition: Secondary | ICD-10-CM | POA: Diagnosis not present

## 2017-07-16 DIAGNOSIS — N823 Fistula of vagina to large intestine: Secondary | ICD-10-CM | POA: Diagnosis not present

## 2017-07-16 DIAGNOSIS — N390 Urinary tract infection, site not specified: Secondary | ICD-10-CM | POA: Diagnosis not present

## 2017-07-19 DIAGNOSIS — K5000 Crohn's disease of small intestine without complications: Secondary | ICD-10-CM | POA: Diagnosis not present

## 2017-07-19 DIAGNOSIS — D510 Vitamin B12 deficiency anemia due to intrinsic factor deficiency: Secondary | ICD-10-CM | POA: Diagnosis not present

## 2017-07-19 DIAGNOSIS — L400 Psoriasis vulgaris: Secondary | ICD-10-CM | POA: Diagnosis not present

## 2017-07-20 ENCOUNTER — Ambulatory Visit (INDEPENDENT_AMBULATORY_CARE_PROVIDER_SITE_OTHER): Payer: Medicare HMO | Admitting: Internal Medicine

## 2017-07-20 DIAGNOSIS — E43 Unspecified severe protein-calorie malnutrition: Secondary | ICD-10-CM | POA: Diagnosis not present

## 2017-07-20 DIAGNOSIS — N189 Chronic kidney disease, unspecified: Secondary | ICD-10-CM | POA: Diagnosis not present

## 2017-07-20 DIAGNOSIS — D649 Anemia, unspecified: Secondary | ICD-10-CM | POA: Diagnosis not present

## 2017-07-20 DIAGNOSIS — N183 Chronic kidney disease, stage 3 (moderate): Secondary | ICD-10-CM | POA: Diagnosis not present

## 2017-07-20 DIAGNOSIS — Z452 Encounter for adjustment and management of vascular access device: Secondary | ICD-10-CM | POA: Diagnosis not present

## 2017-07-20 DIAGNOSIS — E46 Unspecified protein-calorie malnutrition: Secondary | ICD-10-CM | POA: Diagnosis not present

## 2017-07-20 DIAGNOSIS — K5090 Crohn's disease, unspecified, without complications: Secondary | ICD-10-CM | POA: Diagnosis not present

## 2017-07-20 DIAGNOSIS — R1310 Dysphagia, unspecified: Secondary | ICD-10-CM | POA: Diagnosis not present

## 2017-07-20 DIAGNOSIS — L409 Psoriasis, unspecified: Secondary | ICD-10-CM | POA: Diagnosis not present

## 2017-07-20 DIAGNOSIS — R779 Abnormality of plasma protein, unspecified: Secondary | ICD-10-CM | POA: Diagnosis not present

## 2017-07-20 DIAGNOSIS — K219 Gastro-esophageal reflux disease without esophagitis: Secondary | ICD-10-CM | POA: Diagnosis not present

## 2017-07-20 DIAGNOSIS — K449 Diaphragmatic hernia without obstruction or gangrene: Secondary | ICD-10-CM | POA: Diagnosis not present

## 2017-07-20 DIAGNOSIS — B377 Candidal sepsis: Secondary | ICD-10-CM | POA: Diagnosis not present

## 2017-07-23 ENCOUNTER — Other Ambulatory Visit (INDEPENDENT_AMBULATORY_CARE_PROVIDER_SITE_OTHER): Payer: Self-pay | Admitting: Internal Medicine

## 2017-07-23 DIAGNOSIS — E43 Unspecified severe protein-calorie malnutrition: Secondary | ICD-10-CM | POA: Diagnosis not present

## 2017-07-23 DIAGNOSIS — N823 Fistula of vagina to large intestine: Secondary | ICD-10-CM | POA: Diagnosis not present

## 2017-07-23 DIAGNOSIS — N390 Urinary tract infection, site not specified: Secondary | ICD-10-CM | POA: Diagnosis not present

## 2017-07-23 DIAGNOSIS — R112 Nausea with vomiting, unspecified: Secondary | ICD-10-CM | POA: Diagnosis not present

## 2017-07-26 ENCOUNTER — Ambulatory Visit: Payer: Self-pay | Admitting: General Surgery

## 2017-07-26 DIAGNOSIS — D649 Anemia, unspecified: Secondary | ICD-10-CM | POA: Diagnosis not present

## 2017-07-26 DIAGNOSIS — K5090 Crohn's disease, unspecified, without complications: Secondary | ICD-10-CM | POA: Diagnosis not present

## 2017-07-26 DIAGNOSIS — N183 Chronic kidney disease, stage 3 (moderate): Secondary | ICD-10-CM | POA: Diagnosis not present

## 2017-07-26 DIAGNOSIS — E43 Unspecified severe protein-calorie malnutrition: Secondary | ICD-10-CM | POA: Diagnosis not present

## 2017-07-26 DIAGNOSIS — R1310 Dysphagia, unspecified: Secondary | ICD-10-CM | POA: Diagnosis not present

## 2017-07-26 DIAGNOSIS — L409 Psoriasis, unspecified: Secondary | ICD-10-CM | POA: Diagnosis not present

## 2017-07-26 DIAGNOSIS — K449 Diaphragmatic hernia without obstruction or gangrene: Secondary | ICD-10-CM | POA: Diagnosis not present

## 2017-07-26 DIAGNOSIS — Z452 Encounter for adjustment and management of vascular access device: Secondary | ICD-10-CM | POA: Diagnosis not present

## 2017-07-26 DIAGNOSIS — K219 Gastro-esophageal reflux disease without esophagitis: Secondary | ICD-10-CM | POA: Diagnosis not present

## 2017-07-27 ENCOUNTER — Emergency Department (HOSPITAL_COMMUNITY): Payer: Medicare HMO

## 2017-07-27 ENCOUNTER — Inpatient Hospital Stay (HOSPITAL_COMMUNITY)
Admission: EM | Admit: 2017-07-27 | Discharge: 2017-07-29 | DRG: 385 | Disposition: A | Discharge status: Home / Self Care | Payer: Medicare HMO | Attending: Family Medicine | Admitting: Family Medicine

## 2017-07-27 ENCOUNTER — Encounter (HOSPITAL_COMMUNITY): Payer: Self-pay

## 2017-07-27 DIAGNOSIS — Z833 Family history of diabetes mellitus: Secondary | ICD-10-CM | POA: Diagnosis not present

## 2017-07-27 DIAGNOSIS — K921 Melena: Secondary | ICD-10-CM | POA: Diagnosis not present

## 2017-07-27 DIAGNOSIS — I129 Hypertensive chronic kidney disease with stage 1 through stage 4 chronic kidney disease, or unspecified chronic kidney disease: Secondary | ICD-10-CM | POA: Diagnosis present

## 2017-07-27 DIAGNOSIS — K50912 Crohn's disease, unspecified, with intestinal obstruction: Secondary | ICD-10-CM | POA: Diagnosis not present

## 2017-07-27 DIAGNOSIS — Z87891 Personal history of nicotine dependence: Secondary | ICD-10-CM | POA: Diagnosis not present

## 2017-07-27 DIAGNOSIS — E43 Unspecified severe protein-calorie malnutrition: Secondary | ICD-10-CM | POA: Diagnosis not present

## 2017-07-27 DIAGNOSIS — K5090 Crohn's disease, unspecified, without complications: Secondary | ICD-10-CM | POA: Diagnosis present

## 2017-07-27 DIAGNOSIS — Z882 Allergy status to sulfonamides status: Secondary | ICD-10-CM

## 2017-07-27 DIAGNOSIS — R112 Nausea with vomiting, unspecified: Secondary | ICD-10-CM | POA: Diagnosis not present

## 2017-07-27 DIAGNOSIS — Z6821 Body mass index (BMI) 21.0-21.9, adult: Secondary | ICD-10-CM

## 2017-07-27 DIAGNOSIS — K449 Diaphragmatic hernia without obstruction or gangrene: Secondary | ICD-10-CM | POA: Diagnosis not present

## 2017-07-27 DIAGNOSIS — E875 Hyperkalemia: Secondary | ICD-10-CM

## 2017-07-27 DIAGNOSIS — Z8249 Family history of ischemic heart disease and other diseases of the circulatory system: Secondary | ICD-10-CM | POA: Diagnosis not present

## 2017-07-27 DIAGNOSIS — E860 Dehydration: Secondary | ICD-10-CM | POA: Diagnosis present

## 2017-07-27 DIAGNOSIS — N183 Chronic kidney disease, stage 3 (moderate): Secondary | ICD-10-CM | POA: Diagnosis present

## 2017-07-27 DIAGNOSIS — Z881 Allergy status to other antibiotic agents status: Secondary | ICD-10-CM

## 2017-07-27 DIAGNOSIS — R32 Unspecified urinary incontinence: Secondary | ICD-10-CM | POA: Diagnosis not present

## 2017-07-27 DIAGNOSIS — D849 Immunodeficiency, unspecified: Secondary | ICD-10-CM | POA: Diagnosis present

## 2017-07-27 DIAGNOSIS — R9431 Abnormal electrocardiogram [ECG] [EKG]: Secondary | ICD-10-CM | POA: Diagnosis not present

## 2017-07-27 DIAGNOSIS — K580 Irritable bowel syndrome with diarrhea: Secondary | ICD-10-CM | POA: Diagnosis not present

## 2017-07-27 DIAGNOSIS — Z800 Family history of malignant neoplasm of digestive organs: Secondary | ICD-10-CM

## 2017-07-27 DIAGNOSIS — K56609 Unspecified intestinal obstruction, unspecified as to partial versus complete obstruction: Secondary | ICD-10-CM | POA: Diagnosis present

## 2017-07-27 DIAGNOSIS — Z9049 Acquired absence of other specified parts of digestive tract: Secondary | ICD-10-CM

## 2017-07-27 DIAGNOSIS — D899 Disorder involving the immune mechanism, unspecified: Secondary | ICD-10-CM

## 2017-07-27 DIAGNOSIS — Z886 Allergy status to analgesic agent status: Secondary | ICD-10-CM | POA: Diagnosis not present

## 2017-07-27 DIAGNOSIS — R195 Other fecal abnormalities: Secondary | ICD-10-CM

## 2017-07-27 DIAGNOSIS — D509 Iron deficiency anemia, unspecified: Secondary | ICD-10-CM | POA: Diagnosis present

## 2017-07-27 DIAGNOSIS — N179 Acute kidney failure, unspecified: Secondary | ICD-10-CM | POA: Diagnosis present

## 2017-07-27 DIAGNOSIS — K2970 Gastritis, unspecified, without bleeding: Secondary | ICD-10-CM | POA: Diagnosis not present

## 2017-07-27 DIAGNOSIS — L409 Psoriasis, unspecified: Secondary | ICD-10-CM | POA: Diagnosis present

## 2017-07-27 DIAGNOSIS — Z933 Colostomy status: Secondary | ICD-10-CM

## 2017-07-27 DIAGNOSIS — R197 Diarrhea, unspecified: Secondary | ICD-10-CM | POA: Diagnosis not present

## 2017-07-27 DIAGNOSIS — K50919 Crohn's disease, unspecified, with unspecified complications: Secondary | ICD-10-CM | POA: Diagnosis not present

## 2017-07-27 DIAGNOSIS — R1032 Left lower quadrant pain: Secondary | ICD-10-CM | POA: Diagnosis present

## 2017-07-27 DIAGNOSIS — K5669 Other partial intestinal obstruction: Secondary | ICD-10-CM | POA: Diagnosis not present

## 2017-07-27 DIAGNOSIS — R1012 Left upper quadrant pain: Secondary | ICD-10-CM | POA: Diagnosis not present

## 2017-07-27 HISTORY — DX: Unspecified intestinal obstruction, unspecified as to partial versus complete obstruction: K56.609

## 2017-07-27 LAB — CBC WITH DIFFERENTIAL/PLATELET
BASOS ABS: 0 10*3/uL (ref 0.0–0.1)
BASOS PCT: 0 %
Eosinophils Absolute: 0.1 10*3/uL (ref 0.0–0.7)
Eosinophils Relative: 1 %
HEMATOCRIT: 27 % — AB (ref 36.0–46.0)
HEMOGLOBIN: 8.8 g/dL — AB (ref 12.0–15.0)
LYMPHS PCT: 33 %
Lymphs Abs: 3 10*3/uL (ref 0.7–4.0)
MCH: 28.1 pg (ref 26.0–34.0)
MCHC: 32.6 g/dL (ref 30.0–36.0)
MCV: 86.3 fL (ref 78.0–100.0)
Monocytes Absolute: 0.6 10*3/uL (ref 0.1–1.0)
Monocytes Relative: 6 %
NEUTROS ABS: 5.5 10*3/uL (ref 1.7–7.7)
NEUTROS PCT: 60 %
Platelets: 260 10*3/uL (ref 150–400)
RBC: 3.13 MIL/uL — AB (ref 3.87–5.11)
RDW: 15.3 % (ref 11.5–15.5)
WBC: 9.1 10*3/uL (ref 4.0–10.5)

## 2017-07-27 LAB — BASIC METABOLIC PANEL
ANION GAP: 7 (ref 5–15)
BUN: 32 mg/dL — AB (ref 6–20)
CHLORIDE: 109 mmol/L (ref 101–111)
CO2: 18 mmol/L — ABNORMAL LOW (ref 22–32)
Calcium: 8.5 mg/dL — ABNORMAL LOW (ref 8.9–10.3)
Creatinine, Ser: 1.17 mg/dL — ABNORMAL HIGH (ref 0.44–1.00)
GFR calc non Af Amer: 46 mL/min — ABNORMAL LOW (ref >60)
GFR, EST AFRICAN AMERICAN: 54 mL/min — AB (ref >60)
Glucose, Bld: 104 mg/dL — ABNORMAL HIGH (ref 65–99)
POTASSIUM: 5 mmol/L (ref 3.5–5.1)
SODIUM: 134 mmol/L — AB (ref 135–145)

## 2017-07-27 LAB — COMPREHENSIVE METABOLIC PANEL
ALBUMIN: 2.9 g/dL — AB (ref 3.5–5.0)
ALK PHOS: 114 U/L (ref 38–126)
ALT: 18 U/L (ref 14–54)
AST: 26 U/L (ref 15–41)
Anion gap: 9 (ref 5–15)
BUN: 42 mg/dL — AB (ref 6–20)
CO2: 21 mmol/L — AB (ref 22–32)
Calcium: 8.9 mg/dL (ref 8.9–10.3)
Chloride: 104 mmol/L (ref 101–111)
Creatinine, Ser: 1.26 mg/dL — ABNORMAL HIGH (ref 0.44–1.00)
GFR calc Af Amer: 49 mL/min — ABNORMAL LOW (ref >60)
GFR calc non Af Amer: 42 mL/min — ABNORMAL LOW (ref >60)
GLUCOSE: 103 mg/dL — AB (ref 65–99)
Potassium: 5.6 mmol/L — ABNORMAL HIGH (ref 3.5–5.1)
SODIUM: 134 mmol/L — AB (ref 135–145)
Total Bilirubin: 0.5 mg/dL (ref 0.3–1.2)
Total Protein: 7.2 g/dL (ref 6.5–8.1)

## 2017-07-27 LAB — LIPASE, BLOOD: Lipase: 27 U/L (ref 11–51)

## 2017-07-27 LAB — POC OCCULT BLOOD, ED: Fecal Occult Bld: POSITIVE — AB

## 2017-07-27 MED ORDER — METHYLPREDNISOLONE SODIUM SUCC 40 MG IJ SOLR
40.0000 mg | Freq: Every day | INTRAMUSCULAR | Status: DC
  Administered 2017-07-28: 40 mg via INTRAVENOUS
  Filled 2017-07-27: qty 1

## 2017-07-27 MED ORDER — ACETAMINOPHEN 325 MG PO TABS
650.0000 mg | ORAL_TABLET | Freq: Four times a day (QID) | ORAL | Status: DC | PRN
  Administered 2017-07-28 – 2017-07-29 (×2): 650 mg via ORAL
  Filled 2017-07-27 (×2): qty 2

## 2017-07-27 MED ORDER — PROMETHAZINE HCL 25 MG/ML IJ SOLN
12.5000 mg | Freq: Once | INTRAMUSCULAR | Status: AC
  Administered 2017-07-27: 12.5 mg via INTRAVENOUS
  Filled 2017-07-27: qty 1

## 2017-07-27 MED ORDER — DIPHENOXYLATE-ATROPINE 2.5-0.025 MG PO TABS
2.0000 | ORAL_TABLET | Freq: Once | ORAL | Status: AC
  Administered 2017-07-27: 2 via ORAL
  Filled 2017-07-27: qty 2

## 2017-07-27 MED ORDER — IOPAMIDOL (ISOVUE-300) INJECTION 61%
INTRAVENOUS | Status: AC
  Filled 2017-07-27: qty 100

## 2017-07-27 MED ORDER — SODIUM CHLORIDE 0.9 % IV BOLUS (SEPSIS)
1000.0000 mL | Freq: Once | INTRAVENOUS | Status: AC
  Administered 2017-07-27: 1000 mL via INTRAVENOUS

## 2017-07-27 MED ORDER — SODIUM CHLORIDE 0.9% FLUSH
10.0000 mL | Freq: Two times a day (BID) | INTRAVENOUS | Status: DC
  Administered 2017-07-27 – 2017-07-28 (×2): 10 mL

## 2017-07-27 MED ORDER — IOPAMIDOL (ISOVUE-300) INJECTION 61%
30.0000 mL | Freq: Once | INTRAVENOUS | Status: AC | PRN
  Administered 2017-07-27: 30 mL via ORAL

## 2017-07-27 MED ORDER — MORPHINE SULFATE (PF) 2 MG/ML IV SOLN
4.0000 mg | Freq: Once | INTRAVENOUS | Status: AC
  Administered 2017-07-27: 4 mg via INTRAVENOUS
  Filled 2017-07-27: qty 2

## 2017-07-27 MED ORDER — PANTOPRAZOLE SODIUM 40 MG IV SOLR
40.0000 mg | Freq: Two times a day (BID) | INTRAVENOUS | Status: DC
  Administered 2017-07-27 – 2017-07-28 (×3): 40 mg via INTRAVENOUS
  Filled 2017-07-27 (×3): qty 40

## 2017-07-27 MED ORDER — MORPHINE SULFATE (PF) 2 MG/ML IV SOLN
1.0000 mg | INTRAVENOUS | Status: DC | PRN
  Administered 2017-07-27 – 2017-07-28 (×2): 1 mg via INTRAVENOUS
  Filled 2017-07-27 (×2): qty 1

## 2017-07-27 MED ORDER — ALBUTEROL SULFATE (2.5 MG/3ML) 0.083% IN NEBU: 3.0000 mL | INHALATION_SOLUTION | Freq: Four times a day (QID) | RESPIRATORY_TRACT | Status: DC | PRN

## 2017-07-27 MED ORDER — IOPAMIDOL (ISOVUE-300) INJECTION 61%
100.0000 mL | Freq: Once | INTRAVENOUS | Status: AC | PRN
  Administered 2017-07-27: 80 mL via INTRAVENOUS

## 2017-07-27 MED ORDER — PANTOPRAZOLE SODIUM 40 MG IV SOLR
40.0000 mg | Freq: Once | INTRAVENOUS | Status: AC
  Administered 2017-07-27: 40 mg via INTRAVENOUS
  Filled 2017-07-27: qty 40

## 2017-07-27 MED ORDER — ACETAMINOPHEN 650 MG RE SUPP: 650.0000 mg | Freq: Four times a day (QID) | RECTAL | Status: DC | PRN

## 2017-07-27 MED ORDER — SODIUM CHLORIDE 0.9 % IV SOLN
INTRAVENOUS | Status: DC
  Administered 2017-07-27 – 2017-07-28 (×3): via INTRAVENOUS

## 2017-07-27 MED ORDER — PROMETHAZINE HCL 25 MG PO TABS
12.5000 mg | ORAL_TABLET | Freq: Four times a day (QID) | ORAL | Status: DC | PRN
  Administered 2017-07-27: 12.5 mg via ORAL
  Filled 2017-07-27: qty 1

## 2017-07-27 MED ORDER — SODIUM CHLORIDE 0.9% FLUSH: 10.0000 mL | INTRAVENOUS | Status: DC | PRN

## 2017-07-27 MED ORDER — IOPAMIDOL (ISOVUE-300) INJECTION 61%
INTRAVENOUS | Status: AC
  Filled 2017-07-27: qty 30

## 2017-07-27 MED ORDER — METHYLPREDNISOLONE SODIUM SUCC 40 MG IJ SOLR
40.0000 mg | Freq: Once | INTRAMUSCULAR | Status: AC
  Administered 2017-07-27: 40 mg via INTRAVENOUS
  Filled 2017-07-27: qty 1

## 2017-07-27 NOTE — ED Notes (Signed)
Attempted to call report and receiving nurse did not answer phone when transferred to that number by [place] staff member.

## 2017-07-27 NOTE — Consult Note (Signed)
[facility]  Reason for Consult: small bowel obstruction, hiatal hernia  Referring Physician: Merrily Pew, [place], in [place] ER  ROSALIND GUIDO is an 71 y.o. female.  HPI: 71 yo BF presents to ER with nausea, emesis, and abdominal pain for 3 days.  She complains of cramping abd pain since starting to take iron supplements.  Patient has known hx of Crohn's disease with multiple abdominal surgeries including bowel resection, lysis of adhesions, appendectomy, and cholecystectomy, and colostomy closure.  Patient has a known large hiatal hernia and has recently been evaluated for surgical repair by Dr. Ralene Ok and Dr. Kaylyn Lim.  Patient is followed by Dr. Laural Golden and Dr. Ardis Hughs from GI.  She is on biologics for Crohn's.  In ER, patient is dehydrated and anemic.  CTA shows large hiatal hernia containing stomach and small bowel.  There is inflammation in the distal small bowel with a transition point likely resulting in obstruction.  There may be additional transition points within the hiatal hernia.  There is no sign of intestinal ischemia.  General surgery is asked to consult during this admission.  Patient has been on home TNA for nutritional support.  Past Medical History:  Diagnosis Date  . Crohn disease ([place])   . Hiatal hernia 12/07/2015  . Psoriasis     Past Surgical History:  Procedure Laterality Date  . ANAL STENOSIS  ~ 2010  . APPENDECTOMY  1980's  . BOWEL RESECTION  2009  . CHOLECYSTECTOMY  1990's  . COLONOSCOPY  2013  . COLONOSCOPY WITH PROPOFOL N/A 03/12/2017   Procedure: COLONOSCOPY WITH PROPOFOL;  Surgeon: Rogene Houston, MD;  [facility];  Service: Endoscopy;  Laterality: N/A;  2:05-moved up to 1025 per Lelon Frohlich  . COLOSTOMY  2009   DEMASO  . COLOSTOMY TAKEDOWN  2009   "only had it for about 1 month" (08/22/2013)  . ESOPHAGEAL DILATION N/A 03/12/2017   Procedure: ESOPHAGEAL DILATION;  Surgeon: Rogene Houston, MD;  [facility];  Service: Endoscopy;  Laterality: N/A;  . ESOPHAGOGASTRODUODENOSCOPY  11/11/2011   Procedure: ESOPHAGOGASTRODUODENOSCOPY (EGD);  Surgeon: Missy Sabins, MD;  Location: [facility] ENDOSCOPY;  Service: Endoscopy;  Laterality: N/A;  . ESOPHAGOGASTRODUODENOSCOPY  05/18/2012   Procedure: ESOPHAGOGASTRODUODENOSCOPY (EGD);  Surgeon: Rogene Houston, MD;  [facility];  Service: Endoscopy;  Laterality: N/A;  200  . ESOPHAGOGASTRODUODENOSCOPY (EGD) WITH PROPOFOL N/A 03/12/2017   Procedure: ESOPHAGOGASTRODUODENOSCOPY (EGD) WITH PROPOFOL;  Surgeon: Rogene Houston, MD;  [facility];  Service: Endoscopy;  Laterality: N/A;  . FLEXIBLE SIGMOIDOSCOPY  11/11/2011   Procedure: FLEXIBLE SIGMOIDOSCOPY;  Surgeon: Missy Sabins, MD;  Location: [place];  Service: Endoscopy;  Laterality: N/A;  . goiter  1999  . INCISION AND DRAINAGE PERIRECTAL ABSCESS N/A 04/21/2017   Procedure: IRRIGATION AND DEBRIDEMENT PERIRECTAL ABSCESS;  Surgeon: Coralie Keens, MD;  Location: [place];  Service: General;  Laterality: N/A;  . LEFT HEART CATHETERIZATION WITH CORONARY ANGIOGRAM N/A 07/09/2014   Procedure: LEFT HEART CATHETERIZATION WITH CORONARY ANGIOGRAM;  Surgeon: Peter M Martinique, MD;  Location: [facility] CATH LAB;  Service: Cardiovascular;  Laterality: N/A;  . PROCTOSCOPY N/A 01/31/2015   Procedure: RIGID PROCTOSCOPY;  Surgeon: Leighton Ruff, MD;  Location: [place];  Service: General;  Laterality: N/A;  . RECTAL EXAM UNDER ANESTHESIA N/A 01/31/2015   Procedure: RECTAL EXAM UNDER ANESTHESIA WITH PLACEMENT OF A SETON;  Surgeon: Leighton Ruff, MD;  Location: [place];  Service: General;  Laterality: N/A;    Family History  Problem Relation Age of Onset  . Diabetes Mother   . Heart disease Father   . Healthy Sister   . Hypertension Brother   . Colon cancer Brother        died with colon cancer  . Crohn's disease Paternal Aunt     Social History:  reports that she quit smoking about 20 years ago. Her smoking use included  Cigarettes. She has a 0.48 pack-year smoking history. She has never used smokeless tobacco. She reports that she does not drink alcohol or use drugs.  Allergies:  Allergies  Allergen Reactions  . Zithromax [Azithromycin] Swelling and Other (See Comments)    Reaction:  Mouth swelling and blisters   . Aspirin Other (See Comments)    Reaction:  Stomach cramping   . Ibuprofen Other (See Comments)    Pt states that she prefers not to take this medication.   . Sulfa Antibiotics Nausea And Vomiting    Medications: I have reviewed the patient's current medications.  Results for orders placed or performed during the hospital encounter of 07/27/17 (from the past 48 hour(s))  CBC with Differential     Status: Abnormal   Collection Time: 07/27/17  1:23 PM  Result Value Ref Range   WBC 9.1 4.0 - 10.5 K/uL   RBC 3.13 (L) 3.87 - 5.11 MIL/uL   Hemoglobin 8.8 (L) 12.0 - 15.0 g/dL   HCT 27.0 (L) 36.0 - 46.0 %   MCV 86.3 78.0 - 100.0 fL   MCH 28.1 26.0 - 34.0 pg   MCHC 32.6 30.0 - 36.0 g/dL   RDW 15.3 11.5 - 15.5 %   Platelets 260 150 - 400 K/uL   Neutrophils Relative % 60 %   Neutro Abs 5.5 1.7 - 7.7 K/uL   Lymphocytes Relative 33 %   Lymphs Abs 3.0 0.7 - 4.0 K/uL   Monocytes Relative 6 %   Monocytes Absolute 0.6 0.1 - 1.0 K/uL   Eosinophils Relative 1 %   Eosinophils Absolute 0.1 0.0 - 0.7 K/uL   Basophils Relative 0 %   Basophils Absolute 0.0 0.0 - 0.1 K/uL  Comprehensive metabolic panel     Status: Abnormal   Collection Time: 07/27/17  1:23 PM  Result Value Ref Range   Sodium 134 (L) 135 - 145 mmol/L   Potassium 5.6 (H) 3.5 - 5.1 mmol/L   Chloride 104 101 - 111 mmol/L   CO2 21 (L) 22 - 32 mmol/L   Glucose, Bld 103 (H) 65 - 99 mg/dL   BUN 42 (H) 6 - 20 mg/dL   Creatinine, Ser 1.26 (H) 0.44 - 1.00 mg/dL   Calcium 8.9 8.9 - 10.3 mg/dL   Total Protein 7.2 6.5 - 8.1 g/dL   Albumin 2.9 (L) 3.5 - 5.0 g/dL   AST 26 15 - 41 U/L   ALT 18 14 - 54 U/L   Alkaline Phosphatase 114 38 - 126  U/L   Total Bilirubin 0.5 0.3 - 1.2 mg/dL   GFR calc non Af Amer 42 (L) >60 mL/min   GFR calc Af Amer 49 (L) >60 mL/min    Comment: (NOTE) The eGFR has been calculated using the CKD EPI equation. This calculation has not been validated in all clinical situations. eGFR's persistently <60 mL/min signify possible Chronic Kidney Disease.    Anion gap 9 5 - 15  Lipase, blood     Status: None   Collection Time: 07/27/17  1:23  PM  Result Value Ref Range   Lipase 27 11 - 51 U/L  POC occult blood, ED Provider will collect     Status: Abnormal   Collection Time: 07/27/17  2:48 PM  Result Value Ref Range   Fecal Occult Bld POSITIVE (A) NEGATIVE  Type and screen [place]     Status: None   Collection Time: 07/27/17  4:30 PM  Result Value Ref Range   ABO/RH(D) O POS    Antibody Screen NEG    Sample Expiration 07/30/2017     Ct Abdomen Pelvis W Contrast  Result Date: 07/27/2017 CLINICAL DATA:  Patient is complaining of left upper and lower quadrant pain. Question Crohn's exacerbation. EXAM: CT ABDOMEN AND PELVIS WITH CONTRAST TECHNIQUE: Multidetector CT imaging of the abdomen and pelvis was performed using the standard protocol following bolus administration of intravenous contrast. CONTRAST:  93m ISOVUE-300 IOPAMIDOL (ISOVUE-300) INJECTION 61% COMPARISON:  June 14, 2017 FINDINGS: Lower chest: There is a very large hiatal hernia which will be described below. The hiatal hernia displaces the heart anteriorly. Lung bases are normal. Hepatobiliary: The patient is status post cholecystectomy. No liver lesions identified. The portal vein is patent. Pancreas: Unremarkable. No pancreatic ductal dilatation or surrounding inflammatory changes. Spleen: Normal in size without focal abnormality. Adrenals/Urinary Tract: Adrenal glands are normal. The kidneys, ureters, and bladder are normal. Stomach/Bowel: The patient has a very large hiatal hernia. The visualized portion of the stomach is  otherwise normal. The duodenum and proximal jejunum are normal as well. The mid jejunum then extends superiorly into the hiatal hernia. The entire extend of jejunum within the hernia is not included on today's study. However, there are dilated loops of bowel within the hernia. The dilatation extends to a region of thickening and inflammation in the bowel of the right lower mid abdomen on coronal image 44 on axial image 30. Distal to this transition point, fluid-filled bowel extends back into the hiatal hernia before exiting the hernia and connecting to the descending colon. The descending colon is fluid-filled along its length extending to the level of the rectum. Another apparent transition point is associated with a loop of dilated bowel in the hernia as seen on coronal image 83. It is difficult to follow the decompressed bowel beyond this level. Vascular/Lymphatic: No significant vascular findings are present. No enlarged abdominal or pelvic lymph nodes. Reproductive: Uterus and bilateral adnexa are unremarkable. Other: Increased attenuation in the fat of the perineum is stable. Musculoskeletal: No acute or significant osseous findings. IMPRESSION: 1. There is a complicated bowel obstruction. There is a transition point in the lower abdomen, just to the right of midline as seen on axial image 30. There appears to be focal inflammation in this region and this could represent a site of Crohn's exacerbation resulting in the obstruction. Distal to this site, the colon is fluid-filled and smaller but not completely decompressed in caliber. The dilated loops of bowel within the hiatal hernia are not completely imaged today. There is suggestion of other transition points which may be contributing to the small bowel obstruction within the hernia as above. 2. No other interval changes or acute abnormalities. Electronically Signed   By: DDorise BullionIII M.D   On: 07/27/2017 17:01    Review of Systems   Constitutional: Negative for chills and fever.  HENT: Negative.   Eyes: Negative.   Respiratory: Negative.   Cardiovascular: Positive for leg swelling.  Gastrointestinal: Positive for abdominal pain, nausea and vomiting.  Genitourinary: Negative.   Musculoskeletal: Negative.   Skin: Negative.   Neurological: Negative.   Endo/Heme/Allergies: Negative.   Psychiatric/Behavioral: Negative.    Blood pressure 102/70, pulse (!) 105, temperature 98.4 F (36.9 C), resp. rate 20, height 5' 4.5" (1.638 m), weight 59 kg (130 lb), SpO2 97 %. Physical Exam  Constitutional: She is oriented to person, place, and time. She appears well-developed and well-nourished. No distress.  HENT:  Head: Normocephalic and atraumatic.  Right Ear: External ear normal.  Left Ear: External ear normal.  Mouth/Throat: No oropharyngeal exudate.  Eyes: Pupils are equal, round, and reactive to light. Conjunctivae are normal. No scleral icterus.  Neck: Normal range of motion. Neck supple. No tracheal deviation present. No thyromegaly present.  Cardiovascular: Normal rate, regular rhythm and normal heart sounds.   No murmur heard. Respiratory: Effort normal and breath sounds normal. No respiratory distress. She has no wheezes. She has no rales.  GI: Bowel sounds are normal. She exhibits distension. She exhibits no mass. There is no tenderness. There is no rebound and no guarding.  Well healed surgical incisions, no obvious herniae  Musculoskeletal: She exhibits edema and tenderness. She exhibits no deformity.  Neurological: She is alert and oriented to person, place, and time.  Skin: Skin is warm and dry. She is not diaphoretic.  Psychiatric: She has a normal mood and affect. Her behavior is normal.    Assessment/Plan: Small bowel obstruction, possibly due to Crohn's disease versus hiatal hernia or both Hiatal hernia Crohn's disease, active Hx of bowel obstructions   Agree with medical admission, NPO, IV  hydration, bowel rest  Correct electrolyte abnormalities  Resume TNA  Repeat AXR in AM 8/22  May need NG tube if persistent nausea and emesis - may need to be place fluoroscopically given HH  Will discuss with Dr. Rosendo Gros and Dr. Hassell Done - Dr. Lucia Gaskins will assess on 8/22  Earnstine Regal, MD, [facility]. Office: [place] 07/27/2017, 7:03 PM

## 2017-07-27 NOTE — H&P (Signed)
History and Physical    [name]:[id number] DOB: October 03, 1946 DOA: 07/27/2017  PCP: Iona Beard, MD  Patient coming from: Home   I have personally briefly reviewed patient's old medical records in [place]  Chief Complaint: nausea, vomiting, abdominal pain   HPI: Danielle [name] is a 71 y.o. female with medical history significant of Crohn's diseases, S/P bowel resection, hiatal hernia, iron deficiency anemia, She had colonoscopy 03-2017 revealed noncritical ileocolonic anastomotic stricture. She also had mild active disease involving anorectal region. Patient has been on infliximab. Patient was started on parenteral nutrition 06-2017 for severely malnutrition. She presents today complaining of abdominal pain, cramping in nature that started 3 days prior to admission. She report [pain started after she started to take iron pills. She also report nausea, vomiting 5 to 6 episodes per day since Sunday. She report diarrhea 4 episodes per day, since 3 days prior to admission. She report diarrhea has improved, she has had 2 BM today only.   ED Course: cr at 5.6, cr 1.26, sodium 134, hb 8.8, CT abdomen and pelvis; There is a complicated bowel obstruction. There is a transition point in the lower abdomen, just to the right of midline as seen on axial image 30. There appears to be focal inflammation in this region and this could represent a site of Crohn's exacerbation resulting in the obstruction. Distal to this site, the colon is fluid-filled and smaller but not completely decompressed in caliber. The dilated loops of bowel within the hiatal hernia are not completely imaged today. There is suggestion of other transition points which may be contributing to the small bowel obstruction within the hernia as above.  Review of Systems: As per HPI otherwise 10 point review of systems negative.    Past Medical History:  Diagnosis Date  . Crohn disease ([place])   . Hiatal hernia 12/07/2015  .  Psoriasis     Past Surgical History:  Procedure Laterality Date  . ANAL STENOSIS  ~ 2010  . APPENDECTOMY  1980's  . BOWEL RESECTION  2009  . CHOLECYSTECTOMY  1990's  . COLONOSCOPY  2013  . COLONOSCOPY WITH PROPOFOL N/A 03/12/2017   Procedure: COLONOSCOPY WITH PROPOFOL;  Surgeon: Rogene Houston, MD;  [facility];  Service: Endoscopy;  Laterality: N/A;  2:05-moved up to 1025 per Lelon Frohlich  . COLOSTOMY  2009   DEMASO  . COLOSTOMY TAKEDOWN  2009   "only had it for about 1 month" (08/22/2013)  . ESOPHAGEAL DILATION N/A 03/12/2017   Procedure: ESOPHAGEAL DILATION;  Surgeon: Rogene Houston, MD;  [facility];  Service: Endoscopy;  Laterality: N/A;  . ESOPHAGOGASTRODUODENOSCOPY  11/11/2011   Procedure: ESOPHAGOGASTRODUODENOSCOPY (EGD);  Surgeon: Missy Sabins, MD;  Location[facility] ENDOSCOPY;  Service: Endoscopy;  Laterality: N/A;  . ESOPHAGOGASTRODUODENOSCOPY  05/18/2012   Procedure: ESOPHAGOGASTRODUODENOSCOPY (EGD);  Surgeon: Rogene Houston, MD;  [facility];  Service: Endoscopy;  Laterality: N/A;  200  . ESOPHAGOGASTRODUODENOSCOPY (EGD) WITH PROPOFOL N/A 03/12/2017   Procedure: ESOPHAGOGASTRODUODENOSCOPY (EGD) WITH PROPOFOL;  Surgeon: Rogene Houston, MD;  [facility];  Service: Endoscopy;  Laterality: N/A;  . FLEXIBLE SIGMOIDOSCOPY  11/11/2011   Procedure: FLEXIBLE SIGMOIDOSCOPY;  Surgeon: Missy Sabins, MD;  Location: [place];  Service: Endoscopy;  Laterality: N/A;  . goiter  1999  . INCISION AND DRAINAGE PERIRECTAL ABSCESS N/A 04/21/2017   Procedure: IRRIGATION AND DEBRIDEMENT PERIRECTAL ABSCESS;  Surgeon: Coralie Keens, MD;  Location: [place];  Service: General;  Laterality: N/A;  . LEFT HEART CATHETERIZATION WITH CORONARY ANGIOGRAM N/A 07/09/2014   Procedure: LEFT HEART CATHETERIZATION WITH CORONARY ANGIOGRAM;  Surgeon: Peter M Martinique, MD;  Location: [facility] CATH LAB;  Service: Cardiovascular;  Laterality: N/A;  . PROCTOSCOPY N/A 01/31/2015   Procedure: RIGID  PROCTOSCOPY;  Surgeon: Leighton Ruff, MD;  Location: [place];  Service: General;  Laterality: N/A;  . RECTAL EXAM UNDER ANESTHESIA N/A 01/31/2015   Procedure: RECTAL EXAM UNDER ANESTHESIA WITH PLACEMENT OF A SETON;  Surgeon: Leighton Ruff, MD;  Location: [place];  Service: General;  Laterality: N/A;     reports that she quit smoking about 20 years ago. Her smoking use included Cigarettes. She has a 0.48 pack-year smoking history. She has never used smokeless tobacco. She reports that she does not drink alcohol or use drugs.  Allergies  Allergen Reactions  . Zithromax [Azithromycin] Swelling and Other (See Comments)    Reaction:  Mouth swelling and blisters   . Aspirin Other (See Comments)    Reaction:  Stomach cramping   . Ibuprofen Other (See Comments)    Pt states that she prefers not to take this medication.   . Sulfa Antibiotics Nausea And Vomiting    Family History  Problem Relation Age of Onset  . Diabetes Mother   . Heart disease Father   . Healthy Sister   . Hypertension Brother   . Colon cancer Brother        died with colon cancer  . Crohn's disease Paternal Aunt      Prior to Admission medications   Medication Sig Start Date End Date Taking? Authorizing Provider  albuterol (PROVENTIL HFA;VENTOLIN HFA) 108 (90 Base) MCG/ACT inhaler Inhale 2 puffs into the lungs every 6 (six) hours as needed for wheezing. 12/09/15  Yes Rama, Venetia Maxon, MD  capsicum oleoresin (TRIXAICIN) 0.025 % cream Apply topically 3 (three) times daily. Patient taking differently: Apply 1 application topically 3 (three) times daily as needed (abscess).  06/15/17  Yes Nita Sells, MD  cyanocobalamin (,VITAMIN B-12,) 1000 MCG/ML injection INJECT 1000 MCG IM ONCE MONTHLY 07/09/17  Yes Rehman, Mechele Dawley, MD  dicyclomine (BENTYL) 10 MG/5ML syrup TAKE 10 MLS (20 MG TOTAL) BY MOUTH 3 (THREE) TIMES DAILY AS NEEDED. Patient taking differently: Take 20 mg by mouth 3 (three) times daily as needed (stomach  cramps).  07/26/17  Yes Rehman, Mechele Dawley, MD  diphenoxylate-atropine (LOMOTIL) 2.5-0.025 MG tablet TAKE 1 TABLET BY MOUTH FOUR TIMES A DAY AS NEEDED Patient taking differently: Take one tablet twice daily. 06/28/17  Yes Setzer, Terri L, NP  ferrous sulfate 325 (65 FE) MG EC tablet Take 325 mg by mouth 3 (three) times daily with meals.   Yes [provider]  furosemide (LASIX) 20 MG tablet Take 1 tablet (20 mg total) by mouth daily as needed (Take for weight gain of more than 2 pounds in a day or 5 pounds in 2 days.). 06/16/17  Yes Lavina Hamman, MD  HYDROcodone-acetaminophen (NORCO) 7.5-325 MG tablet Take 1 tablet by mouth every 12 (twelve) hours as needed for moderate pain.   Yes [provider]  nystatin (NYSTATIN) powder Apply 1 g topically 2 (two) times daily as needed (for itching/irritation).   Yes [provider]  pantoprazole (PROTONIX) 40 MG tablet Take 1 tablet (40 mg total) by mouth daily. 06/16/17  Yes Nita Sells, MD  promethazine (PHENERGAN) 25 MG tablet Take 1 tablet (25 mg total) by mouth 2 (two) times daily  as needed for nausea or vomiting. 07/01/17  Yes Rehman, Mechele Dawley, MD  Simethicone (PHAZYME PO) Take 1 tablet by mouth daily as needed (gas).    Yes [provider]  sodium bicarbonate 650 MG tablet TAKE 1 TABLET (650 MG TOTAL) BY MOUTH 2 (TWO) TIMES DAILY. 05/14/17  Yes [provider]  spironolactone (ALDACTONE) 50 MG tablet Take 50 mg by mouth daily.  06/23/17  Yes [provider]  feeding supplement (BOOST / RESOURCE BREEZE) LIQD Take 1 Container by mouth 3 (three) times daily between meals. Patient not taking: Reported on 07/27/2017 06/15/17   Nita Sells, MD    Physical Exam: Vitals:   07/27/17 1232 07/27/17 1245 07/27/17 1300 07/27/17 1800  BP:   102/64 102/70  Pulse:  99  (!) 105  Resp:    20  Temp:      TempSrc:      SpO2:  100%  97%  Weight: 59 kg (130 lb)     Height: 5' 4.5" (1.638 m)        Constitutional: NAD, calm, comfortable Vitals:   07/27/17 1232 07/27/17 1245 07/27/17 1300 07/27/17 1800  BP:   102/64 102/70  Pulse:  99  (!) 105  Resp:    20  Temp:      TempSrc:      SpO2:  100%  97%  Weight: 59 kg (130 lb)     Height: 5' 4.5" (1.638 m)      Eyes: PERRL, lids and conjunctivae normal ENMT: Mucous membranes are moist. Posterior pharynx clear of any exudate or lesions.Normal dentition.  Neck: normal, supple, no masses, no thyromegaly Respiratory: clear to auscultation bilaterally, no wheezing, no crackles. Normal respiratory effort. No accessory muscle use.  Cardiovascular: Regular rate and rhythm, no murmurs / rubs / gallops. Plus 2  extremity edema. 2+ pedal pulses. No carotid bruits.  Abdomen: epigastrium and upper quadrant tenderness, no rigidity, soft, decreased Bowel sound.  Musculoskeletal: no clubbing / cyanosis. No joint deformity upper and lower extremities. Good ROM, no contractures. Normal muscle tone.  Skin: no rashes, lesions, ulcers. No induration. Mild LE redness.  Neurologic: CN 2-12 grossly intact. Sensation intact, DTR normal. Strength 5/5 in all 4.  Psychiatric: Normal judgment and insight. Alert and oriented x 3. Normal mood.    Labs on Admission: I have personally reviewed following labs and imaging studies  CBC:  Recent Labs Lab 07/27/17 1323  WBC 9.1  NEUTROABS 5.5  HGB 8.8*  HCT 27.0*  MCV 86.3  PLT 737   Basic Metabolic Panel:  Recent Labs Lab 07/27/17 1323  NA 134*  K 5.6*  CL 104  CO2 21*  GLUCOSE 103*  BUN 42*  CREATININE 1.26*  CALCIUM 8.9   GFR: Estimated Creatinine Clearance: 37.2 mL/min (A) (by C-G formula based on SCr of 1.26 mg/dL (H)). Liver Function Tests:  Recent Labs Lab 07/27/17 1323  AST 26  ALT 18  ALKPHOS 114  BILITOT 0.5  PROT 7.2  ALBUMIN 2.9*    Recent Labs Lab 07/27/17 1323  LIPASE 27   No results for input(s): AMMONIA in the last 168 hours. Coagulation Profile: No results  for input(s): INR, PROTIME in the last 168 hours. Cardiac Enzymes: No results for input(s): CKTOTAL, CKMB, CKMBINDEX, TROPONINI in the last 168 hours. BNP (last 3 results) No results for input(s): PROBNP in the last 8760 hours. HbA1C: No results for input(s): HGBA1C in the last 72 hours. CBG: No results for input(s): GLUCAP in the  last 168 hours. Lipid Profile: No results for input(s): CHOL, HDL, LDLCALC, TRIG, CHOLHDL, LDLDIRECT in the last 72 hours. Thyroid Function Tests: No results for input(s): TSH, T4TOTAL, FREET4, T3FREE, THYROIDAB in the last 72 hours. Anemia Panel: No results for input(s): VITAMINB12, FOLATE, FERRITIN, TIBC, IRON, RETICCTPCT in the last 72 hours. Urine analysis:    Component Value Date/Time   COLORURINE YELLOW 06/08/2017 1539   APPEARANCEUR CLOUDY (A) 06/08/2017 1539   LABSPEC 1.010 06/08/2017 1539   PHURINE 6.0 06/08/2017 1539   GLUCOSEU NEGATIVE 06/08/2017 1539   HGBUR MODERATE (A) 06/08/2017 1539   BILIRUBINUR NEGATIVE 06/08/2017 1539   KETONESUR NEGATIVE 06/08/2017 1539   PROTEINUR 30 (A) 06/08/2017 1539   UROBILINOGEN 0.2 07/29/2015 2231   NITRITE NEGATIVE 06/08/2017 1539   LEUKOCYTESUR LARGE (A) 06/08/2017 1539    Radiological Exams on Admission: Ct Abdomen Pelvis W Contrast  Result Date: 07/27/2017 CLINICAL DATA:  Patient is complaining of left upper and lower quadrant pain. Question Crohn's exacerbation. EXAM: CT ABDOMEN AND PELVIS WITH CONTRAST TECHNIQUE: Multidetector CT imaging of the abdomen and pelvis was performed using the standard protocol following bolus administration of intravenous contrast. CONTRAST:  106m ISOVUE-300 IOPAMIDOL (ISOVUE-300) INJECTION 61% COMPARISON:  June 14, 2017 FINDINGS: Lower chest: There is a very large hiatal hernia which will be described below. The hiatal hernia displaces the heart anteriorly. Lung bases are normal. Hepatobiliary: The patient is status post cholecystectomy. No liver lesions identified. The portal  vein is patent. Pancreas: Unremarkable. No pancreatic ductal dilatation or surrounding inflammatory changes. Spleen: Normal in size without focal abnormality. Adrenals/Urinary Tract: Adrenal glands are normal. The kidneys, ureters, and bladder are normal. Stomach/Bowel: The patient has a very large hiatal hernia. The visualized portion of the stomach is otherwise normal. The duodenum and proximal jejunum are normal as well. The mid jejunum then extends superiorly into the hiatal hernia. The entire extend of jejunum within the hernia is not included on today's study. However, there are dilated loops of bowel within the hernia. The dilatation extends to a region of thickening and inflammation in the bowel of the right lower mid abdomen on coronal image 44 on axial image 30. Distal to this transition point, fluid-filled bowel extends back into the hiatal hernia before exiting the hernia and connecting to the descending colon. The descending colon is fluid-filled along its length extending to the level of the rectum. Another apparent transition point is associated with a loop of dilated bowel in the hernia as seen on coronal image 83. It is difficult to follow the decompressed bowel beyond this level. Vascular/Lymphatic: No significant vascular findings are present. No enlarged abdominal or pelvic lymph nodes. Reproductive: Uterus and bilateral adnexa are unremarkable. Other: Increased attenuation in the fat of the perineum is stable. Musculoskeletal: No acute or significant osseous findings. IMPRESSION: 1. There is a complicated bowel obstruction. There is a transition point in the lower abdomen, just to the right of midline as seen on axial image 30. There appears to be focal inflammation in this region and this could represent a site of Crohn's exacerbation resulting in the obstruction. Distal to this site, the colon is fluid-filled and smaller but not completely decompressed in caliber. The dilated loops of bowel  within the hiatal hernia are not completely imaged today. There is suggestion of other transition points which may be contributing to the small bowel obstruction within the hernia as above. 2. No other interval changes or acute abnormalities. Electronically Signed   By: DDorise Bullion  III M.D   On: 07/27/2017 17:01    EKG: Independently reviewed. Sinus, PVC, RBBB  Assessment/Plan Active Problems:   Immunosuppressed status (HCC)   Crohn disease (HCC)   Protein-calorie malnutrition, severe   SBO (small bowel obstruction) (HCC)   Hyperkalemia  1-SBO, in setting of Crohn's diseases;  NPO. IV fluids.  Surgery consulted. Per ED PA, surgery will see patient tonight.  GI recommends ; IV solumedrol 40 Mg IV BID>  Defer NG tube to sx.   2-Anemia, iron deficiency; Occult blood positive, black stool. Black stool could be from iron pills.  IV prtonix.  Repeat Hb in am. Blood transfusion if hb drop further.   3-Hyperkalemia;  Avoid kayexalate due to SBO.  IV fluids.  Hold spironolactone.  Repeat B-met tonight, if higher consider IV lasix.   4-Severe malnutrition; Resume TPN. Patient on TPN at home.   5-Lower extremities edema; hold lasix in setting of vomiting.      DVT prophylaxis: SCD. Follow Hb.  Code Status: Full code.  Family Communication: Care discussed with patient.  Disposition Plan: to be determine.  Consults called: GI, SX Admission status: Inpatient, telemetry    Niel Hummer A MD [facility] Pager [phone]  If 7PM-7AM, please contact night-coverage [email or web address] Password [facility]  07/27/2017, 6:45 PM

## 2017-07-27 NOTE — ED Notes (Signed)
Attempted to call report. Receiving nurse with another patient.

## 2017-07-27 NOTE — Progress Notes (Signed)
PHARMACY - ADULT TOTAL PARENTERAL NUTRITION CONSULT NOTE   Pharmacy Consult for TPN Indication: bowel resection, on TPN at home  Patient Measurements: Height: 5' 4.5" (163.8 cm) Weight: 130 lb (59 kg) IBW/kg (Calculated) : 55.85 TPN AdjBW (KG): 59 Body mass index is 21.97 kg/m. Usual Weight:   Insulin Requirements:   Current Nutrition: home TPN  IVF: NS @ 70mls/hr  Central access:  TPN start date:   ASSESSMENT                                                                                                          HPI: 71 yo female pt with complex surgical history due to bowel resection due to Crohn's disease, parenteral nutrition via TPN, large hiatal hernia, cholecystectomy, appendectomy. She presents with 2 days of worsening abdominal pain and vomiting.   Significant events:   Today:    Glucose - <150  Electrolytes - Na 134, K 5.6, CCa WNL  Renal -Scr elevated at 1.26, CrCl ~ 44mls/min  LFTs - WNL  TGs - 77 (7/11)  Prealbumin - with am labs  NUTRITIONAL GOALS                                                                                             RD recs: f/u with recs  Previous admission recs (06/17/17) were Kcal 1550-1750 kcal/day, protein 78-88g/day   PLAN                                                                                                                           Discussed with pt in ED> pharmacy will Call [facility] in the morning and obtain pt's TPN formula. No TPN tonight, pt understood and had no issues.  NS currently running at 76mls/hr.  TPN lab panels on Mondays & Thursdays.  F/u daily.  Dolly Rias RPh 07/27/2017, 9:36 PM Pager [phone]

## 2017-07-27 NOTE — ED Triage Notes (Signed)
Per EMS, pt c/o LLQ and LUQ abdominal pain, nausea, emesis, diarrhea, black tarry stool onset Sunday. Right chest pain during emesis only, no chest pain at rest. Pt began taking iron supplement on Sunday around same time as symptoms began. Receives TPN through PICC line.

## 2017-07-27 NOTE — ED Provider Notes (Signed)
Medical screening examination/treatment/procedure(s) were conducted as a shared visit with non-physician practitioner(s) and myself.  I personally evaluated the patient during the encounter.   EKG Interpretation None     patient here complaining of left upper left lower quadrant abdominal pain. Questionable Crohn's exacerbation. She also has evidence of GI bleed.Will check abdominal CT   Lacretia Leigh, MD 07/27/17 1549

## 2017-07-27 NOTE — ED Provider Notes (Signed)
Patient was signed out to me by Alecia Lemming, PA-C. Vision is admitted for bowel obstruction with possible surgical consult. Waiting for GI consult for recommendation and treatment of Crohn's flare.  Nurse informed me that patient was confused about why she was being admitted and wanted to speak with provider. I discussed with patient regarding the findings on her CT and the reasons why she would need to be admitted. I answered patient's questions.   Discussed with Dr. Benson Norway (GI). Recommends starting patient on 40 mg Solumedrol for Crohn's flare. Will plan to consult during hospital admission. Will likely see her in the hospital tomorrow.   Volanda Napoleon, PA-C 07/27/17 1826    Lacretia Leigh, MD 07/28/17 [phone]

## 2017-07-27 NOTE — ED Provider Notes (Signed)
Patient seen in conjunction with [place] PA-C -- pt with complex surgical history due to bowel resection due to Crohn's disease, parenteral nutrition via TPN, large hiatal hernia, cholecystectomy, appendectomy. She presents with 2 days of worsening abdominal pain and vomiting. She also noted dark stools that started after she began taking iron due to anemia.  CT performed here showing complicated bowel obstruction involving large hiatal hernia. Vomiting and pain are currently controlled. Patient updated.  I spoke with Dr. Harlow Asa who will see patient.   5:36 PM Spoke with Dr. Tyrell Antonio who will admit given multiple chronic medical problems.    Will speak with GI for reccs regarding treatment of Crohn's at request of hospitalist.   BP 102/64   Pulse 99   Temp 98.4 F (36.9 C)   Resp 18   Ht 5' 4.5" (1.638 m)   Wt 59 kg (130 lb)   SpO2 100%   BMI 21.97 kg/m       Carlisle Cater, PA-C 07/27/17 1741    Lacretia Leigh, MD 07/28/17 [phone]

## 2017-07-27 NOTE — ED Triage Notes (Signed)
Pt states she is having abdominal pain that comes and goes. Pain feels like a tightening/cramping senstation. No respiratory distress noted

## 2017-07-27 NOTE — ED Notes (Signed)
Bed: WA17 Expected date:  Expected time:  Means of arrival:  Comments: EMS 71 y/o abd pain

## 2017-07-27 NOTE — ED Provider Notes (Signed)
[facility] DEPT Provider Note   CSN: [id number] Arrival date & time: 07/27/17  1213     History   Chief Complaint Chief Complaint  Patient presents with  . Abdominal Pain    HPI Danielle [name] is a 71 y.o. female.  HPI   [name] is a 71yo female with a history of crohn's disease (on long term remicade therapy), hiatal hernia, TPN through a PICC line for low appetite, anemia who presents to the Emergency Department for generalized abdominal pain, diarrhea and vomiting. Patient states that she "got sick when I started taking those iron pills." She says that she started taking iron pills on Sunday because the doctor told her she has low hemoglobin. Shortly after taking the iron, she developed a "cramping, squeezing" abdominal pain over her entire abdomen. The pain comes and goes, is a 10/10 in severity at it's worst. It is worsened with movement. She is also having black diarrhea about 6 times a day whereas she typically has 3 formed stools daily. She also endorses nausea and vomiting with the pain, describing four episodes of clear vomit today following water intake. Patient has a history of numerous abdominal surgeries including bowel resection with colostomy, appendectomy and cholecystectomy. She states that she is on TPN because she does not have an appetite needs to gain weight in order for her doctor to perform a hiatal hernia repair. She denies fever, fatigue, hematochezia, hematemesis, dysuria, back pain.   Past Medical History:  Diagnosis Date  . Crohn disease ([place])   . Hiatal hernia 12/07/2015  . Psoriasis     Patient Active Problem List   Diagnosis Date Noted  . Protein-calorie malnutrition, severe 06/10/2017  . Acute lower UTI 06/09/2017  . Nausea and vomiting 06/08/2017  . 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 Chronic diarrhea 05/09/2017  . Infliximab (Remicade) long-term use 05/09/2017  . Schatzki's ring of distal esophagus 05/09/2017  . Anorexia 05/09/2017  .  Weight loss, unintentional 05/09/2017  . Acute respiratory failure with hypoxia ([place]) 05/07/2017  . Hypotension 05/07/2017  . Hiatal hernia with GERD 05/07/2017  . UTI (urinary tract infection) 05/07/2017  . Tachycardia 04/27/2017  . Perineal irritation in female 04/27/2017  . Mucosal irritation of oral cavity 04/27/2017  . Hypokalemia 04/27/2017  . Chest pain   . Facial swelling   . Right facial numbness   . Perirectal abscess 04/21/2017  . Left buttock abscess 04/21/2017  . Crohn disease ([place]) 02/19/2017  . Immunosuppressed status ([place]) 12/09/2015  . 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 Hiatal hernia 12/07/2015  . Sepsis due to pneumonia ([place]) 12/06/2015  . Acute renal failure superimposed on stage 3 chronic kidney disease ([place]) 12/06/2015  . Perianal Crohn's disease, with fistula ([place])   . Hypomagnesemia   . ARF (acute renal failure) ([place]) 11/18/2014  . Leukocytosis 11/06/2011    Past Surgical History:  Procedure Laterality Date  . ANAL STENOSIS  ~ 2010  . APPENDECTOMY  1980's  . BOWEL RESECTION  2009  . CHOLECYSTECTOMY  1990's  . COLONOSCOPY  2013  . COLONOSCOPY WITH PROPOFOL N/A 03/12/2017   Procedure: COLONOSCOPY WITH PROPOFOL;  Surgeon: Rogene Houston, MD;  [facility];  Service: Endoscopy;  Laterality: N/A;  2:05-moved up to 1025 per Lelon Frohlich  . COLOSTOMY  2009   DEMASO  . COLOSTOMY TAKEDOWN  2009   "only had it for about 1 month" (08/22/2013)  . ESOPHAGEAL DILATION N/A 03/12/2017   Procedure: ESOPHAGEAL DILATION;  Surgeon: Rogene Houston, MD;  [facility];  Service: Endoscopy;  Laterality: N/A;  . ESOPHAGOGASTRODUODENOSCOPY  11/11/2011   Procedure: ESOPHAGOGASTRODUODENOSCOPY (EGD);  Surgeon: Missy Sabins, MD;  Location[facility] ENDOSCOPY;  Service: Endoscopy;  Laterality: N/A;  . ESOPHAGOGASTRODUODENOSCOPY  05/18/2012   Procedure: ESOPHAGOGASTRODUODENOSCOPY (EGD);  Surgeon: Rogene Houston, MD;  [facility];  Service: Endoscopy;  Laterality: N/A;  200    . ESOPHAGOGASTRODUODENOSCOPY (EGD) WITH PROPOFOL N/A 03/12/2017   Procedure: ESOPHAGOGASTRODUODENOSCOPY (EGD) WITH PROPOFOL;  Surgeon: Rogene Houston, MD;  [facility];  Service: Endoscopy;  Laterality: N/A;  . FLEXIBLE SIGMOIDOSCOPY  11/11/2011   Procedure: FLEXIBLE SIGMOIDOSCOPY;  Surgeon: Missy Sabins, MD;  Location: [facility];  Service: Endoscopy;  Laterality: N/A;  . goiter  1999  . INCISION AND DRAINAGE PERIRECTAL ABSCESS N/A 04/21/2017   Procedure: IRRIGATION AND DEBRIDEMENT PERIRECTAL ABSCESS;  Surgeon: Coralie Keens, MD;  Location: [facility];  Service: General;  Laterality: N/A;  . LEFT HEART CATHETERIZATION WITH CORONARY ANGIOGRAM N/A 07/09/2014   Procedure: LEFT HEART CATHETERIZATION WITH CORONARY ANGIOGRAM;  Surgeon: Peter M Martinique, MD;  Location: [facility] CATH LAB;  Service: Cardiovascular;  Laterality: N/A;  . PROCTOSCOPY N/A 01/31/2015   Procedure: RIGID PROCTOSCOPY;  Surgeon: Leighton Ruff, MD;  Location: [facility];  Service: General;  Laterality: N/A;  . RECTAL EXAM UNDER ANESTHESIA N/A 01/31/2015   Procedure: RECTAL EXAM UNDER ANESTHESIA WITH PLACEMENT OF A SETON;  Surgeon: Leighton Ruff, MD;  Location: [facility];  Service: General;  Laterality: N/A;    OB History    No data available       Home Medications    Prior to Admission medications   Medication Sig Start Date End Date Taking? Authorizing Provider  albuterol (PROVENTIL HFA;VENTOLIN HFA) 108 (90 Base) MCG/ACT inhaler Inhale 2 puffs into the lungs every 6 (six) hours as needed for wheezing. 12/09/15  Yes Rama, Venetia Maxon, MD  capsicum oleoresin (TRIXAICIN) 0.025 % cream Apply topically 3 (three) times daily. Patient taking differently: Apply 1 application topically 3 (three) times daily as needed (abscess).  06/15/17  Yes Nita Sells, MD  cyanocobalamin (,VITAMIN B-12,) 1000 MCG/ML injection INJECT 1000 MCG IM ONCE MONTHLY 07/09/17  Yes Rehman, Mechele Dawley, MD  dicyclomine (BENTYL) 10 MG/5ML syrup TAKE 10 MLS (20 MG  TOTAL) BY MOUTH 3 (THREE) TIMES DAILY AS NEEDED. Patient taking differently: Take 20 mg by mouth 3 (three) times daily as needed (stomach cramps).  07/26/17  Yes Rehman, Mechele Dawley, MD  diphenoxylate-atropine (LOMOTIL) 2.5-0.025 MG tablet TAKE 1 TABLET BY MOUTH FOUR TIMES A DAY AS NEEDED Patient taking differently: Take one tablet twice daily. 06/28/17  Yes Setzer, Terri L, NP  ferrous sulfate 325 (65 FE) MG EC tablet Take 325 mg by mouth 3 (three) times daily with meals.   Yes [provider]  furosemide (LASIX) 20 MG tablet Take 1 tablet (20 mg total) by mouth daily as needed (Take for weight gain of more than 2 pounds in a day or 5 pounds in 2 days.). 06/16/17  Yes Lavina Hamman, MD  HYDROcodone-acetaminophen (NORCO) 7.5-325 MG tablet Take 1 tablet by mouth every 12 (twelve) hours as needed for moderate pain.   Yes [provider]  nystatin (NYSTATIN) powder Apply 1 g topically 2 (two) times daily as needed (for itching/irritation).   Yes [provider]  pantoprazole (PROTONIX) 40 MG tablet Take 1 tablet (40 mg total) by mouth daily. 06/16/17  Yes Nita Sells, MD  promethazine (PHENERGAN) 25 MG  tablet Take 1 tablet (25 mg total) by mouth 2 (two) times daily as needed for nausea or vomiting. 07/01/17  Yes Rehman, Mechele Dawley, MD  Simethicone (PHAZYME PO) Take 1 tablet by mouth daily as needed (gas).    Yes [provider]  sodium bicarbonate 650 MG tablet TAKE 1 TABLET (650 MG TOTAL) BY MOUTH 2 (TWO) TIMES DAILY. 05/14/17  Yes [provider]  spironolactone (ALDACTONE) 50 MG tablet Take 50 mg by mouth daily.  06/23/17  Yes [provider]  feeding supplement (BOOST / RESOURCE BREEZE) LIQD Take 1 Container by mouth 3 (three) times daily between meals. Patient not taking: Reported on 07/27/2017 06/15/17   Nita Sells, MD    Family History Family History  Problem Relation Age of Onset  . Diabetes Mother   . Heart disease Father   .  Healthy Sister   . Hypertension Brother   . Colon cancer Brother        died with colon cancer  . Crohn's disease Paternal Aunt     Social History Social History  Substance Use Topics  . Smoking status: Former Smoker    Packs/day: 0.12    Years: 4.00    Types: Cigarettes    Quit date: 10/05/1996  . Smokeless tobacco: Never Used     Comment: 08/22/2013 1 pack every two weeks when she did smoked  . Alcohol use No     Allergies   Zithromax [azithromycin]; Aspirin; Ibuprofen; and Sulfa antibiotics   Review of Systems Review of Systems  Constitutional: Positive for appetite change. Negative for chills, fatigue and fever.  HENT: Negative for congestion, ear pain and sore throat.   Eyes: Negative for pain.  Respiratory: Negative for cough, shortness of breath and wheezing.   Cardiovascular: Positive for chest pain ("pin prick" sensation on her right chest wall) and leg swelling (chronic). Negative for palpitations.  Gastrointestinal: Positive for abdominal pain, diarrhea, nausea and vomiting. Negative for abdominal distention, anal bleeding and rectal pain.  Genitourinary: Negative for difficulty urinating and dysuria.  Musculoskeletal: Negative for arthralgias and joint swelling.  Neurological: Negative for weakness, light-headedness, numbness and headaches.  Psychiatric/Behavioral: Negative for agitation.     Physical Exam Updated Vital Signs BP 102/64   Pulse 99   Temp 98.4 F (36.9 C)   Resp 18   Ht 5' 4.5" (1.638 m)   Wt 59 kg (130 lb)   SpO2 100%   BMI 21.97 kg/m   Physical Exam  Constitutional: She appears well-developed and well-nourished. No distress.  HENT:  Head: Normocephalic and atraumatic.  Mouth/Throat: Oropharynx is clear and moist. No oropharyngeal exudate.  Eyes: Right eye exhibits no discharge. Left eye exhibits no discharge.  Neck: Normal range of motion. Neck supple.  Cardiovascular: Normal rate and regular rhythm.  Exam reveals no gallop and  no friction rub.   No murmur heard. Bilateral 1+ pitting edema up to the midcalf with erythema.   Pulmonary/Chest: Effort normal. No respiratory distress. She has no wheezes. She has no rales. She exhibits no tenderness.  Abdominal: Soft. Bowel sounds are normal. She exhibits no distension, no abdominal bruit and no pulsatile midline mass.  Tenderness to deep palpation over the LUQ and RUQ. Guarding with palpation over the LUQ and RUQ. No rebound tenderness. Rectal exam with green-appearing stool.   Neurological: She is alert. Coordination normal.  Skin: Skin is warm and dry. She is not diaphoretic.  Psychiatric: She has a normal mood and affect. Her behavior is  normal.  Nursing note and vitals reviewed.    ED Treatments / Results  Labs (all labs ordered are listed, but only abnormal results are displayed) Labs Reviewed  CBC WITH DIFFERENTIAL/PLATELET - Abnormal; Notable for the following:       Result Value   RBC 3.13 (*)    Hemoglobin 8.8 (*)    HCT 27.0 (*)    All other components within normal limits  COMPREHENSIVE METABOLIC PANEL - Abnormal; Notable for the following:    Sodium 134 (*)    Potassium 5.6 (*)    CO2 21 (*)    Glucose, Bld 103 (*)    BUN 42 (*)    Creatinine, Ser 1.26 (*)    Albumin 2.9 (*)    GFR calc non Af Amer 42 (*)    GFR calc Af Amer 49 (*)    All other components within normal limits  POC OCCULT BLOOD, ED - Abnormal; Notable for the following:    Fecal Occult Bld POSITIVE (*)    All other components within normal limits  LIPASE, BLOOD  TYPE AND SCREEN    EKG  EKG Interpretation None       Radiology Ct Abdomen Pelvis W Contrast  Result Date: 07/27/2017 CLINICAL DATA:  Patient is complaining of left upper and lower quadrant pain. Question Crohn's exacerbation. EXAM: CT ABDOMEN AND PELVIS WITH CONTRAST TECHNIQUE: Multidetector CT imaging of the abdomen and pelvis was performed using the standard protocol following bolus administration of  intravenous contrast. CONTRAST:  73mL ISOVUE-300 IOPAMIDOL (ISOVUE-300) INJECTION 61% COMPARISON:  June 14, 2017 FINDINGS: Lower chest: There is a very large hiatal hernia which will be described below. The hiatal hernia displaces the heart anteriorly. Lung bases are normal. Hepatobiliary: The patient is status post cholecystectomy. No liver lesions identified. The portal vein is patent. Pancreas: Unremarkable. No pancreatic ductal dilatation or surrounding inflammatory changes. Spleen: Normal in size without focal abnormality. Adrenals/Urinary Tract: Adrenal glands are normal. The kidneys, ureters, and bladder are normal. Stomach/Bowel: The patient has a very large hiatal hernia. The visualized portion of the stomach is otherwise normal. The duodenum and proximal jejunum are normal as well. The mid jejunum then extends superiorly into the hiatal hernia. The entire extend of jejunum within the hernia is not included on today's study. However, there are dilated loops of bowel within the hernia. The dilatation extends to a region of thickening and inflammation in the bowel of the right lower mid abdomen on coronal image 44 on axial image 30. Distal to this transition point, fluid-filled bowel extends back into the hiatal hernia before exiting the hernia and connecting to the descending colon. The descending colon is fluid-filled along its length extending to the level of the rectum. Another apparent transition point is associated with a loop of dilated bowel in the hernia as seen on coronal image 83. It is difficult to follow the decompressed bowel beyond this level. Vascular/Lymphatic: No significant vascular findings are present. No enlarged abdominal or pelvic lymph nodes. Reproductive: Uterus and bilateral adnexa are unremarkable. Other: Increased attenuation in the fat of the perineum is stable. Musculoskeletal: No acute or significant osseous findings. IMPRESSION: 1. There is a complicated bowel obstruction.  There is a transition point in the lower abdomen, just to the right of midline as seen on axial image 30. There appears to be focal inflammation in this region and this could represent a site of Crohn's exacerbation resulting in the obstruction. Distal to this site, the colon is  fluid-filled and smaller but not completely decompressed in caliber. The dilated loops of bowel within the hiatal hernia are not completely imaged today. There is suggestion of other transition points which may be contributing to the small bowel obstruction within the hernia as above. 2. No other interval changes or acute abnormalities. Electronically Signed   By: Dorise Bullion III M.D   On: 07/27/2017 17:01    Procedures Procedures (including critical care time)  Medications Ordered in ED Medications  iopamidol (ISOVUE-300) 61 % injection (not administered)  iopamidol (ISOVUE-300) 61 % injection (not administered)  morphine 2 MG/ML injection 4 mg (4 mg Intravenous Given 07/27/17 1346)  promethazine (PHENERGAN) injection 12.5 mg (12.5 mg Intravenous Given 07/27/17 1345)  sodium chloride 0.9 % bolus 1,000 mL (0 mLs Intravenous Stopped 07/27/17 1446)  iopamidol (ISOVUE-300) 61 % injection 30 mL (30 mLs Oral Contrast Given 07/27/17 1341)  pantoprazole (PROTONIX) injection 40 mg (40 mg Intravenous Given 07/27/17 1548)  iopamidol (ISOVUE-300) 61 % injection 100 mL (80 mLs Intravenous Contrast Given 07/27/17 1557)  morphine 2 MG/ML injection 4 mg (4 mg Intravenous Given 07/27/17 1634)     Initial Impression / Assessment and Plan / ED Course  I have reviewed the triage vital signs and the nursing notes.  Pertinent labs & imaging results that were available during my care of the patient were reviewed by me and considered in my medical decision making (see chart for details).    2:30PM Patient complaining of generalized abdominal pain, black diarrhea and vomiting for 2 days. She is on chronic immunosuppressive therapy for Crohn's  disease and has an extensive history of abdominal surgery. Abdomen is exquisitely tender on exam. Because she is immunosuppressed, has history of chrohns, and tender on exam will get CT abdomen imaging. Nausea medication given and bolus of fluids ordered as patient has not been able to keep fluids down today. Pain addressed with IV pain medication.   3PM Lab-work reviewed. Patient is likely with upper GI bleed, as fecal occult blood is positive, hemoglobin 8.8 and she is complaining of dark stools since Sunday. Suspect Crohn's exacerbation at this point. Type and screen ordered. Additionally, her BUN and Creatinine are elevated, suspect AKI due to blood loss vs dehydration. Fluid bolus should help with this. Her potassium is elevated at 5.6, EKG ordered to evaluate for arrhythmia given hyperkalemia. CT abdomen pending.   4:15PM EKG reviewed. Patient has a RBBB, seen on previous EKGs. She has some paired PVC's. Not experiencing chest pain, palpitations at this time and do not suspect this is contributing to her complaints. There are no peaked t-waves or QRS prolongation associated with elevated potassium. Will not give potassium binder at this time with patient's ongoing abdominal pain, diarrhea.   5PM CT abdomen shows a complicated bowel obstruction in which small intestine is obstructed within patient's hiatal hernia, patient also has focal inflammation in the lower abdomen with a transition point which is likely a site of Crohn's exacerbation. General surgery consulted and agrees to come evaluate the patient. Will admit to hospitalist service for bowel obstruction, GI bleed, AKI.     Final Clinical Impressions(s) / ED Diagnoses   Final diagnoses:  Heme positive stool  Hyperkalemia  Crohn's disease with intestinal obstruction, unspecified gastrointestinal tract location [facility])    New Prescriptions New Prescriptions   No medications on file     Bernarda Caffey 07/27/17 1739      Lacretia Leigh, MD 07/28/17 [phone]

## 2017-07-28 ENCOUNTER — Inpatient Hospital Stay (HOSPITAL_COMMUNITY): Payer: Medicare HMO

## 2017-07-28 DIAGNOSIS — E875 Hyperkalemia: Secondary | ICD-10-CM

## 2017-07-28 DIAGNOSIS — K50919 Crohn's disease, unspecified, with unspecified complications: Secondary | ICD-10-CM

## 2017-07-28 DIAGNOSIS — K56609 Unspecified intestinal obstruction, unspecified as to partial versus complete obstruction: Secondary | ICD-10-CM

## 2017-07-28 LAB — COMPREHENSIVE METABOLIC PANEL
ALK PHOS: 102 U/L (ref 38–126)
ALT: 15 U/L (ref 14–54)
AST: 21 U/L (ref 15–41)
Albumin: 2.8 g/dL — ABNORMAL LOW (ref 3.5–5.0)
Anion gap: 5 (ref 5–15)
BUN: 31 mg/dL — ABNORMAL HIGH (ref 6–20)
CHLORIDE: 113 mmol/L — AB (ref 101–111)
CO2: 19 mmol/L — AB (ref 22–32)
Calcium: 8.4 mg/dL — ABNORMAL LOW (ref 8.9–10.3)
Creatinine, Ser: 1.15 mg/dL — ABNORMAL HIGH (ref 0.44–1.00)
GFR calc non Af Amer: 47 mL/min — ABNORMAL LOW (ref >60)
GFR, EST AFRICAN AMERICAN: 55 mL/min — AB (ref >60)
Glucose, Bld: 115 mg/dL — ABNORMAL HIGH (ref 65–99)
POTASSIUM: 5.3 mmol/L — AB (ref 3.5–5.1)
SODIUM: 137 mmol/L (ref 135–145)
Total Bilirubin: 0.4 mg/dL (ref 0.3–1.2)
Total Protein: 7 g/dL (ref 6.5–8.1)

## 2017-07-28 LAB — DIFFERENTIAL
BASOS ABS: 0 10*3/uL (ref 0.0–0.1)
BASOS PCT: 0 %
Eosinophils Absolute: 0 10*3/uL (ref 0.0–0.7)
Eosinophils Relative: 0 %
Lymphocytes Relative: 40 %
Lymphs Abs: 1.7 10*3/uL (ref 0.7–4.0)
MONO ABS: 0.1 10*3/uL (ref 0.1–1.0)
Monocytes Relative: 2 %
NEUTROS ABS: 2.4 10*3/uL (ref 1.7–7.7)
NEUTROS PCT: 58 %

## 2017-07-28 LAB — GLUCOSE, CAPILLARY: GLUCOSE-CAPILLARY: 117 mg/dL — AB (ref 65–99)

## 2017-07-28 LAB — CBC
HCT: 25.6 % — ABNORMAL LOW (ref 36.0–46.0)
Hemoglobin: 8.2 g/dL — ABNORMAL LOW (ref 12.0–15.0)
MCH: 28 pg (ref 26.0–34.0)
MCHC: 32 g/dL (ref 30.0–36.0)
MCV: 87.4 fL (ref 78.0–100.0)
PLATELETS: 250 10*3/uL (ref 150–400)
RBC: 2.93 MIL/uL — AB (ref 3.87–5.11)
RDW: 15.3 % (ref 11.5–15.5)
WBC: 4.2 10*3/uL (ref 4.0–10.5)

## 2017-07-28 LAB — PREALBUMIN: PREALBUMIN: 14.9 mg/dL — AB (ref 18–38)

## 2017-07-28 LAB — MAGNESIUM: MAGNESIUM: 1.9 mg/dL (ref 1.7–2.4)

## 2017-07-28 LAB — MRSA PCR SCREENING: MRSA by PCR: POSITIVE — AB

## 2017-07-28 LAB — TRIGLYCERIDES: TRIGLYCERIDES: 45 mg/dL (ref <150)

## 2017-07-28 LAB — PHOSPHORUS: PHOSPHORUS: 4.1 mg/dL (ref 2.5–4.6)

## 2017-07-28 MED ORDER — INSULIN ASPART 100 UNIT/ML ~~LOC~~ SOLN: 0.0000 [IU] | Freq: Four times a day (QID) | SUBCUTANEOUS | Status: DC

## 2017-07-28 MED ORDER — METHYLPREDNISOLONE SODIUM SUCC 40 MG IJ SOLR
20.0000 mg | Freq: Every day | INTRAMUSCULAR | Status: DC
  Administered 2017-07-29: 20 mg via INTRAVENOUS
  Filled 2017-07-28: qty 1

## 2017-07-28 MED ORDER — FAT EMULSION 20 % IV EMUL
240.0000 mL | INTRAVENOUS | Status: DC
  Filled 2017-07-28: qty 250

## 2017-07-28 MED ORDER — TRACE MINERALS CR-CU-MN-SE-ZN 10-1000-500-60 MCG/ML IV SOLN
INTRAVENOUS | Status: DC
  Filled 2017-07-28 (×3): qty 960

## 2017-07-28 MED ORDER — BOOST / RESOURCE BREEZE PO LIQD
1.0000 | Freq: Three times a day (TID) | ORAL | Status: DC
  Administered 2017-07-28 – 2017-07-29 (×5): 1 via ORAL

## 2017-07-28 MED ORDER — PROMETHAZINE HCL 25 MG PO TABS
12.5000 mg | ORAL_TABLET | ORAL | Status: AC
  Administered 2017-07-28: 12.5 mg via ORAL

## 2017-07-28 MED ORDER — DICYCLOMINE HCL 10 MG PO CAPS
10.0000 mg | ORAL_CAPSULE | Freq: Three times a day (TID) | ORAL | Status: DC | PRN
  Administered 2017-07-28 (×2): 10 mg via ORAL
  Filled 2017-07-28 (×2): qty 1

## 2017-07-28 NOTE — Progress Notes (Signed)
[facility] Progress Note     Subjective: CC: feels hungry Patient denies abdominal pain currently, no nausea or vomiting. Passing gas.   Objective: Vital signs in last 24 hours: Temp:  [98.1 F (36.7 C)-98.7 F (37.1 C)] 98.7 F (37.1 C) (08/22 0649) Pulse Rate:  [93-105] 101 (08/22 0649) Resp:  [16-21] 18 (08/22 0649) BP: (93-115)/(61-78) 114/67 (08/22 0649) SpO2:  [96 %-100 %] 96 % (08/22 0649) Weight:  [59 kg (130 lb)-65.8 kg (145 lb 2 oz)] 65.8 kg (145 lb 2 oz) (08/22 0500) Last BM Date: 07/27/17  Intake/Output from previous day: 08/21 0701 - 08/22 0700 In: 1000 [IV Piggyback:1000] Out: -  Intake/Output this shift: No intake/output data recorded.  PE: Gen:  Alert, NAD, pleasant Card:  Regular rate and rhythm, pedal pulses 2+ BL Pulm:  Normal effort, clear to auscultation bilaterally Abd: Soft, non-tender, non-distended, bowel sounds present, no HSM Skin: warm and dry, no rashes  Psych: A&Ox3   Lab Results:   Recent Labs  07/27/17 1323 07/28/17 0500  WBC 9.1 4.2  HGB 8.8* 8.2*  HCT 27.0* 25.6*  PLT 260 250   BMET  Recent Labs  07/27/17 1851 07/28/17 0500  NA 134* 137  K 5.0 5.3*  CL 109 113*  CO2 18* 19*  GLUCOSE 104* 115*  BUN 32* 31*  CREATININE 1.17* 1.15*  CALCIUM 8.5* 8.4*   PT/INR No results for input(s): LABPROT, INR in the last 72 hours. CMP     Component Value Date/Time   NA 137 07/28/2017 0500   K 5.3 (H) 07/28/2017 0500   CL 113 (H) 07/28/2017 0500   CO2 19 (L) 07/28/2017 0500   GLUCOSE 115 (H) 07/28/2017 0500   BUN 31 (H) 07/28/2017 0500   CREATININE 1.15 (H) 07/28/2017 0500   CREATININE 1.37 (H) 03/01/2017 1149   CALCIUM 8.4 (L) 07/28/2017 0500   PROT 7.0 07/28/2017 0500   ALBUMIN 2.8 (L) 07/28/2017 0500   AST 21 07/28/2017 0500   ALT 15 07/28/2017 0500   ALKPHOS 102 07/28/2017 0500   BILITOT 0.4 07/28/2017 0500   GFRNONAA 47 (L) 07/28/2017 0500   GFRNONAA 42 (L) 11/15/2013 0934   GFRAA 55 (L) 07/28/2017  0500   GFRAA 49 (L) 11/15/2013 0934   Lipase     Component Value Date/Time   LIPASE 27 07/27/2017 1323       Studies/Results: Dg Abd 1 View  Result Date: 07/28/2017 CLINICAL DATA:  Follow-up of small bowel obstruction. EXAM: ABDOMEN - 1 VIEW COMPARISON:  Abdominal and pelvic CT scan of July 27, 2017 FINDINGS: There is a moderate amount of gas within what appears to be the left colon. No significant small bowel distention is observed today. No free extraluminal gas collections are observed. There is a moderate amount of contrast within the urinary bladder. IMPRESSION: Moderate amount of gas within the left colon without objective evidence of small bowel ileus or obstruction. Electronically Signed   By: Bryse Blanchette  [place] M.D.   On: 07/28/2017 09:38   Ct Abdomen Pelvis W Contrast  Result Date: 07/27/2017 CLINICAL DATA:  Patient is complaining of left upper and lower quadrant pain. Question Crohn's exacerbation. EXAM: CT ABDOMEN AND PELVIS WITH CONTRAST TECHNIQUE: Multidetector CT imaging of the abdomen and pelvis was performed using the standard protocol following bolus administration of intravenous contrast. CONTRAST:  79mL ISOVUE-300 IOPAMIDOL (ISOVUE-300) INJECTION 61% COMPARISON:  June 14, 2017 FINDINGS: Lower chest: There is a very large hiatal hernia which will be described below. The  hiatal hernia displaces the heart anteriorly. Lung bases are normal. Hepatobiliary: The patient is status post cholecystectomy. No liver lesions identified. The portal vein is patent. Pancreas: Unremarkable. No pancreatic ductal dilatation or surrounding inflammatory changes. Spleen: Normal in size without focal abnormality. Adrenals/Urinary Tract: Adrenal glands are normal. The kidneys, ureters, and bladder are normal. Stomach/Bowel: The patient has a very large hiatal hernia. The visualized portion of the stomach is otherwise normal. The duodenum and proximal jejunum are normal as well. The mid jejunum then extends  superiorly into the hiatal hernia. The entire extend of jejunum within the hernia is not included on today's study. However, there are dilated loops of bowel within the hernia. The dilatation extends to a region of thickening and inflammation in the bowel of the right lower mid abdomen on coronal image 44 on axial image 30. Distal to this transition point, fluid-filled bowel extends back into the hiatal hernia before exiting the hernia and connecting to the descending colon. The descending colon is fluid-filled along its length extending to the level of the rectum. Another apparent transition point is associated with a loop of dilated bowel in the hernia as seen on coronal image 83. It is difficult to follow the decompressed bowel beyond this level. Vascular/Lymphatic: No significant vascular findings are present. No enlarged abdominal or pelvic lymph nodes. Reproductive: Uterus and bilateral adnexa are unremarkable. Other: Increased attenuation in the fat of the perineum is stable. Musculoskeletal: No acute or significant osseous findings. IMPRESSION: 1. There is a complicated bowel obstruction. There is a transition point in the lower abdomen, just to the right of midline as seen on axial image 30. There appears to be focal inflammation in this region and this could represent a site of Crohn's exacerbation resulting in the obstruction. Distal to this site, the colon is fluid-filled and smaller but not completely decompressed in caliber. The dilated loops of bowel within the hiatal hernia are not completely imaged today. There is suggestion of other transition points which may be contributing to the small bowel obstruction within the hernia as above. 2. No other interval changes or acute abnormalities. Electronically Signed   By: Dorise Bullion III M.D   On: 07/27/2017 17:01    Anti-infectives: Anti-infectives    None       Assessment/Plan SBO  - XR: Moderate amount of gas within the left colon without  objective evidence of small bowel ileus or obstruction  - CT 1/61: complicated bowel obstruction. There is a transition point in the lower abdomen, just to the right of midline as seen on axial image 30. There appears to be focal inflammation in this region and this could represent a site of Crohn's exacerbation resulting in the obstruction. Distal to this site, the colon is fluid-filled and smaller but not completely decompressed in caliber. - continue home TNA - clinically improving and XR this AM improved - start clears and possibly advance to full liquids vs soft diet tomorrow - ambulate and get patient OOB Hiatal hernia  Has seen Dr. Rosendo Gros in our office about this. Crohn's disease, active  Hx of bowel obstructions  FEN - clears, TPN VTE - SCDs, lovenox ID - no current abx  Plan: SBO resolving. Ambulate.   Advance diet to clears.   May be ready for discharge in the next 24-48 hrs.   LOS: 1 day    Brigid Re , [facility] 07/28/2017, 11:26 AM Pager: [phone] Consults: [phone]  Agree with above. Doing better.  Diet being advanced. She's not sure who is managing the TPN. Prealbumin now 14.9 (was 7.5 on 06/14/2017) No surgical issues at this time.  Alphonsa Overall, MD, [facility] Pager: [phone] Office phone:  [phone]

## 2017-07-28 NOTE — Progress Notes (Signed)
PROGRESS NOTE [facility]   Danielle [name]   NKN:[id number] DOB: [birth year]  DOA: 07/27/2017 PCP: Iona Beard, MD   Brief Narrative:  Danielle [name] is a 71 year old female with medical history significant for Crohn's disease, status post bowel resection, hiatal hernia, iron deficiency anemia. Presents to the emergency department complaining of nausea vomiting and abdominal pain. Upon ED evaluation she was found to have small bowel obstruction and she was admitted for further evaluation.  Subjective: Patient seen and examined, passed gas and had a bowel movement. Abdominal pain has improved, denies nausea and vomiting. No acute events overnight   Assessment & Plan: SBO  CT abdomen and pelvis show complicated bowel obstruction Surgical team has been consulted and appreciate input Patient has been managed with conservative treatment - nothing by mouth, pain control and IV fluids She's now passing gas and having bowel movement. Will advance diet as tolerated. 8/22 Abdominal x-ray shows resolution of obstruction  Crohn's diseases GI was called and recommended IV Solu-Medrol 40 mg twice a day, I did not think that this is Crohn's flare, will taper down Solu-Medrol and continue to monitor.  Anemia iron deficiency  Hemoglobin stable around baseline Monitor H&H  Hyperkalemia  Mild elevated potassium, will hold on treatment for now patient on IV fluid No kayexalate indicated this time given SBO  Will monitor in AM, if still elevated will treat with lasix   AKI  Secondary to dehydration  Cr improving  Continue IVF  Severe malnutrition  Patient on TPN  Advance diet as tolerated   DVT prophylaxis: SCD's  Code Status: Full  Family Communication: None at bedside  Disposition Plan: Home in next 1-2 days    Consultants:   Gen surgery   Procedures:   None   Antimicrobials: Anti-infectives    None       Objective: Vitals:   07/27/17 2045 07/27/17 2239  07/28/17 0500 07/28/17 0649  BP: 115/78 109/61  114/67  Pulse: 95 93  (!) 101  Resp: (!) 21 20  18   Temp:  98.1 F (36.7 C)  98.7 F (37.1 C)  TempSrc:  Oral  Oral  SpO2: 100% 99%  96%  Weight:   65.8 kg (145 lb 2 oz)   Height:        Intake/Output Summary (Last 24 hours) at 07/28/17 1130 Last data filed at 07/28/17 0752  Gross per 24 hour  Intake             1000 ml  Output                0 ml  Net             1000 ml   Filed Weights   07/27/17 1223 07/27/17 1232 07/28/17 0500  Weight: 59 kg (130 lb) 59 kg (130 lb) 65.8 kg (145 lb 2 oz)    Examination:  General exam: Appears calm and comfortable  HEENT: AC/AT, PERRLA, OP moist and clear Respiratory system: Clear to auscultation. No wheezes,crackle or rhonchi Cardiovascular system: S1 & S2 heard, RRR. No JVD, murmurs, rubs or gallops Gastrointestinal system: Abdomen is nondistended, soft and nontender. No organomegaly or masses felt. Normal bowel sounds heard. Central nervous system: Alert and oriented. No focal neurological deficits. Extremities: No pedal edema.  Skin: No rashes, lesions or ulcers Psychiatry: Judgement and insight appear normal. Mood & affect appropriate.    Data Reviewed: I have personally reviewed following labs and imaging studies  CBC:  Recent Labs  Lab 07/27/17 1323 07/28/17 0500  WBC 9.1 4.2  NEUTROABS 5.5 2.4  HGB 8.8* 8.2*  HCT 27.0* 25.6*  MCV 86.3 87.4  PLT 260 536   Basic Metabolic Panel:  Recent Labs Lab 07/27/17 1323 07/27/17 1851 07/28/17 0500  NA 134* 134* 137  K 5.6* 5.0 5.3*  CL 104 109 113*  CO2 21* 18* 19*  GLUCOSE 103* 104* 115*  BUN 42* 32* 31*  CREATININE 1.26* 1.17* 1.15*  CALCIUM 8.9 8.5* 8.4*  MG  --   --  1.9  PHOS  --   --  4.1   GFR: Estimated Creatinine Clearance: 40.7 mL/min (A) (by C-G formula based on SCr of 1.15 mg/dL (H)). Liver Function Tests:  Recent Labs Lab 07/27/17 1323 07/28/17 0500  AST 26 21  ALT 18 15  ALKPHOS 114 102  BILITOT  0.5 0.4  PROT 7.2 7.0  ALBUMIN 2.9* 2.8*    Recent Labs Lab 07/27/17 1323  LIPASE 27   No results for input(s): AMMONIA in the last 168 hours. Coagulation Profile: No results for input(s): INR, PROTIME in the last 168 hours. Cardiac Enzymes: No results for input(s): CKTOTAL, CKMB, CKMBINDEX, TROPONINI in the last 168 hours. BNP (last 3 results) No results for input(s): PROBNP in the last 8760 hours. HbA1C: No results for input(s): HGBA1C in the last 72 hours. CBG: No results for input(s): GLUCAP in the last 168 hours. Lipid Profile:  Recent Labs  07/28/17 0500  TRIG 45   Thyroid Function Tests: No results for input(s): TSH, T4TOTAL, FREET4, T3FREE, THYROIDAB in the last 72 hours. Anemia Panel: No results for input(s): VITAMINB12, FOLATE, FERRITIN, TIBC, IRON, RETICCTPCT in the last 72 hours. Sepsis Labs: No results for input(s): PROCALCITON, LATICACIDVEN in the last 168 hours.  No results found for this or any previous visit (from the past 240 hour(s)).    Radiology Studies: Dg Abd 1 View  Result Date: 07/28/2017 CLINICAL DATA:  Follow-up of small bowel obstruction. EXAM: ABDOMEN - 1 VIEW COMPARISON:  Abdominal and pelvic CT scan of July 27, 2017 FINDINGS: There is a moderate amount of gas within what appears to be the left colon. No significant small bowel distention is observed today. No free extraluminal gas collections are observed. There is a moderate amount of contrast within the urinary bladder. IMPRESSION: Moderate amount of gas within the left colon without objective evidence of small bowel ileus or obstruction. Electronically Signed   By: David  Martinique M.D.   On: 07/28/2017 09:38   Ct Abdomen Pelvis W Contrast  Result Date: 07/27/2017 CLINICAL DATA:  Patient is complaining of left upper and lower quadrant pain. Question Crohn's exacerbation. EXAM: CT ABDOMEN AND PELVIS WITH CONTRAST TECHNIQUE: Multidetector CT imaging of the abdomen and pelvis was performed  using the standard protocol following bolus administration of intravenous contrast. CONTRAST:  33mL ISOVUE-300 IOPAMIDOL (ISOVUE-300) INJECTION 61% COMPARISON:  June 14, 2017 FINDINGS: Lower chest: There is a very large hiatal hernia which will be described below. The hiatal hernia displaces the heart anteriorly. Lung bases are normal. Hepatobiliary: The patient is status post cholecystectomy. No liver lesions identified. The portal vein is patent. Pancreas: Unremarkable. No pancreatic ductal dilatation or surrounding inflammatory changes. Spleen: Normal in size without focal abnormality. Adrenals/Urinary Tract: Adrenal glands are normal. The kidneys, ureters, and bladder are normal. Stomach/Bowel: The patient has a very large hiatal hernia. The visualized portion of the stomach is otherwise normal. The duodenum and proximal jejunum are normal as well. The mid  jejunum then extends superiorly into the hiatal hernia. The entire extend of jejunum within the hernia is not included on today's study. However, there are dilated loops of bowel within the hernia. The dilatation extends to a region of thickening and inflammation in the bowel of the right lower mid abdomen on coronal image 44 on axial image 30. Distal to this transition point, fluid-filled bowel extends back into the hiatal hernia before exiting the hernia and connecting to the descending colon. The descending colon is fluid-filled along its length extending to the level of the rectum. Another apparent transition point is associated with a loop of dilated bowel in the hernia as seen on coronal image 83. It is difficult to follow the decompressed bowel beyond this level. Vascular/Lymphatic: No significant vascular findings are present. No enlarged abdominal or pelvic lymph nodes. Reproductive: Uterus and bilateral adnexa are unremarkable. Other: Increased attenuation in the fat of the perineum is stable. Musculoskeletal: No acute or significant osseous findings.  IMPRESSION: 1. There is a complicated bowel obstruction. There is a transition point in the lower abdomen, just to the right of midline as seen on axial image 30. There appears to be focal inflammation in this region and this could represent a site of Crohn's exacerbation resulting in the obstruction. Distal to this site, the colon is fluid-filled and smaller but not completely decompressed in caliber. The dilated loops of bowel within the hiatal hernia are not completely imaged today. There is suggestion of other transition points which may be contributing to the small bowel obstruction within the hernia as above. 2. No other interval changes or acute abnormalities. Electronically Signed   By: Dorise Bullion III M.D   On: 07/27/2017 17:01      Scheduled Meds: . methylPREDNISolone (SOLU-MEDROL) injection  40 mg Intravenous Daily  . pantoprazole (PROTONIX) IV  40 mg Intravenous Q12H  . sodium chloride flush  10-40 mL Intracatheter Q12H   Continuous Infusions: . sodium chloride 50 mL/hr at 07/28/17 0611  . Marland KitchenTPN (CLINIMIX-E) Adult     And  . fat emulsion       LOS: 1 day    Time spent: Total of 25 minutes spent with pt, greater than 50% of which was spent in discussion of  treatment, counseling and coordination of care    Chipper Oman, MD Pager: Text Page via [email or web address]   If 7PM-7AM, please contact night-coverage [email or web address] 07/28/2017, 11:30 AM

## 2017-07-28 NOTE — Care Management Note (Signed)
Case Management Note  Patient Details  Name: EUGINA ROW MRN: [id number] Date of Birth: Apr 26, 1946  Subjective/Objective:       Crohn's diease and bowel obstruction.             Action/Plan: Date:  July 28, 2017 Chart reviewed for concurrent status and case management needs. Will continue to follow patient progress. Discharge Planning: following for needs Expected discharge date: [id number] Velva Harman, BSN, [place], [place]  Expected Discharge Date:   (unknown)               Expected Discharge Plan:  Home/Self Care  In-House Referral:     Discharge planning Services  CM Consult  Post Acute Care Choice:    Choice offered to:     DME Arranged:    DME Agency:     HH Arranged:    [place] Agency:     Status of Service:  In process, will continue to follow  If discussed at Long Length of Stay Meetings, dates discussed:    Additional Comments:  Leeroy Cha, RN 07/28/2017, 8:51 AM

## 2017-07-28 NOTE — Progress Notes (Addendum)
Initial Nutrition Assessment  DOCUMENTATION CODES:   Non-severe (moderate) malnutrition in context of chronic illness  INTERVENTION:   Monitor for diet upgrade and toleration  Boost Breeze po TID, each supplement provides 250 kcal and 9 grams of protein  Per pharmacy: pt's at home TPN: cyclic from 7 pm to 7 am. 1300 mls over 12 hours with ramp up and ramp down providing 1050 kcals and 50 gm protein Dextrose 150 gm Amino Acid 15% 50 gm Lipids 20% 30 mg  NUTRITION DIAGNOSIS:   Malnutrition (Moderate) related to chronic illness (Crohn's Disease) as evidenced by moderate depletions of muscle mass, moderate depletion of body fat, moderate to severe fluid accumulation.  GOAL:   Patient will meet greater than or equal to 90% of their needs  MONITOR:   PO intake, Supplement acceptance, Labs, Weight trends  REASON FOR ASSESSMENT:   Consult New TPN/TNA  ASSESSMENT:   Pt with PMH of Crohn's disease, s/p bowel resection, and hiatal hernia. 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 Pt previously started on TPN in July given her severe malnutrition status. Reports nausea and vomiting with abdominal pain. Presents this admission with small bowel obstruction in the setting of Chron's disease. Currently has NGT to suction.  CT 2/83: complicated bowel obstruction.   Pt currently passing gas and tolerating clears. Pt on at home TPN  managed through [facility]. Pharmacy contacted [facility] and provided her at home regimen (see above). Spoke with pt about her intake. Pt eating 3 meals per day with 3 snacks. Has 1 boost per day. Given her recall, pt seems to be consuming appropriate amount of calories. Records indicate pt has gained 30 lb since June showing a health increase trend. Spoke with surgery PA, who reports pt has a couple TPN treatments left at home. Do not see the need for TPN this hospital stay if pt tolerates soft diet. PA agrees. Will monitor for diet  upgrade toleration.  Nutrition-Focused physical exam completed. Findings are moderate fat depletion, moderate muscle depletion, and no edema. Pt remains moderately malnourished. Wt gain and increased appetite shows pt nutrition status is progressing.    Medications reviewed and include: solumedrol, NS @ 50 ml/hr Labs reviewed: K 5.3 (H) Cl 113 (H) CBG 103-115 BUN 31 (H) Creatinine 1.15 (H) Albumin 2.8 (L)   Diet Order:  Diet clear liquid Room service appropriate? Yes; Fluid consistency: Thin  Skin:  Reviewed, no issues  Last BM:  07/27/17  Height:   Ht Readings from Last 1 Encounters:  07/27/17 5' 4.5" (1.638 m)    Weight:   Wt Readings from Last 1 Encounters:  07/28/17 145 lb 2 oz (65.8 kg)    Ideal Body Weight:  54.5 kg  BMI:  Body mass index is 24.53 kg/m.  Estimated Nutritional Needs:   Kcal:  1645-1845 (25-28 kcal/kg)  Protein:  80-90 grams (1.2-1.4 g/kg)  Fluid:  >1.6 L/day  EDUCATION NEEDS:   No education needs identified at this time  [place], LDN Clinical Nutrition Pager # - [phone]

## 2017-07-28 NOTE — Progress Notes (Signed)
MD notified of positive MRSA PCR. Pt will continue contact isolation

## 2017-07-28 NOTE — Progress Notes (Addendum)
PHARMACY - ADULT TOTAL PARENTERAL NUTRITION CONSULT NOTE   Pharmacy Consult for TPN Indication:SBO, hiatal hernia, on TPN at home  Patient Measurements: Height: 5' 4.5" (163.8 cm) Weight: 145 lb 2 oz (65.8 kg) IBW/kg (Calculated) : 55.85 TPN AdjBW (KG): 59 Body mass index is 24.53 kg/m.   Insulin Requirements: none, no insulin in TPN at home, no SSI at home  Current Nutrition: home TPN: cyclic from 7 pm to 7 am. 1300 mls over 12 hours with ramp up and ramp down providing 1050 kcals and 50 gm protein Dextrose 150 gm Amino Acid 15% 50 gm Lipids 20% 30 mg MVI, trace elements Sod acetate 50 meq  K acetate 40 mreq Kphos 9 mMol Ca gluconate 8 meq Mag 12 meq   IVF: NS @ 72mls/hr  Central access: PICC placed 06/10/17 TPN start date: on PTA per pt : Remo Lipps [phone] at  [place]                                                                                                          HPI: 71 yo female pt with complex surgical history due to bowel resection due to Crohn's disease, parenteral nutrition via TPN, large hiatal hernia, cholecystectomy, appendectomy. She presents with 2 days of worsening abdominal pain and vomiting. SBO possibly due to Crohn's dz vs hiatal hernia or both.  Significant events:   Today:   Glucose - <150, on solumedrol 40 qday  Electrolytes - Na 137, K 5.3, CoCa WNL, PTA on Sodium bicarb 650 mg po BID  Renal -Scr 1.26>1.15, improved with hydration, CrCl ~ 41 mls/min  LFTs - WNL  TGs - 45 8/22  Prealbumin - 14.9 8/22 ( up from 9.4 on7/11)  GI: pt frustrated currently with NPO status because she has an appetite and wants to eat; she states her wt has improved from 115 to 130 pounds, per EPIC her wt is 145 pounds which she says is incorrect  NUTRITIONAL GOALS                                                                                             RD recs: f/u with recs  Previous admission recs (06/17/17) were Kcal 1550-1750 kcal/day,  protein 78-88g/day  PLAN  Run Clinimix E 5/15 960 mls over 12 hours to provide 48 gm protein and 1162 kcals with lipids 20 % over 12 hours to mimic home TPN with PO diet, may need to increase volume if pt remains NPO  F/u with RD for recs  NS currently running at 44mls/hr per MD  IV MVI and trace elements in TPN  SSI to assess tolerance with pt on solumedrol 40 IV qday  TPN lab panels on Mondays & Thursdays.    F/u daily.  F/u [facility] and GI plans Eudelia Bunch, Pharm.D. [phone] 07/28/2017 11:25 AM Addendum: TPN DC'd by [facility]. Pt eating Eudelia Bunch, Pharm.D. [phone] 07/28/2017 12:52 PM

## 2017-07-29 DIAGNOSIS — R32 Unspecified urinary incontinence: Secondary | ICD-10-CM | POA: Diagnosis not present

## 2017-07-29 DIAGNOSIS — E43 Unspecified severe protein-calorie malnutrition: Secondary | ICD-10-CM

## 2017-07-29 DIAGNOSIS — K50919 Crohn's disease, unspecified, with unspecified complications: Secondary | ICD-10-CM | POA: Diagnosis not present

## 2017-07-29 DIAGNOSIS — K580 Irritable bowel syndrome with diarrhea: Secondary | ICD-10-CM | POA: Diagnosis not present

## 2017-07-29 LAB — BASIC METABOLIC PANEL
Anion gap: 7 (ref 5–15)
BUN: 25 mg/dL — AB (ref 6–20)
CALCIUM: 8.1 mg/dL — AB (ref 8.9–10.3)
CO2: 16 mmol/L — AB (ref 22–32)
Chloride: 115 mmol/L — ABNORMAL HIGH (ref 101–111)
Creatinine, Ser: 1.18 mg/dL — ABNORMAL HIGH (ref 0.44–1.00)
GFR calc Af Amer: 53 mL/min — ABNORMAL LOW (ref >60)
GFR calc non Af Amer: 46 mL/min — ABNORMAL LOW (ref >60)
Glucose, Bld: 95 mg/dL (ref 65–99)
POTASSIUM: 3.9 mmol/L (ref 3.5–5.1)
Sodium: 138 mmol/L (ref 135–145)

## 2017-07-29 LAB — CBC WITH DIFFERENTIAL/PLATELET
BASOS ABS: 0 10*3/uL (ref 0.0–0.1)
Basophils Relative: 0 %
EOS PCT: 0 %
Eosinophils Absolute: 0 10*3/uL (ref 0.0–0.7)
HEMATOCRIT: 23.9 % — AB (ref 36.0–46.0)
Hemoglobin: 7.5 g/dL — ABNORMAL LOW (ref 12.0–15.0)
LYMPHS PCT: 37 %
Lymphs Abs: 3.3 10*3/uL (ref 0.7–4.0)
MCH: 27.6 pg (ref 26.0–34.0)
MCHC: 31.4 g/dL (ref 30.0–36.0)
MCV: 87.9 fL (ref 78.0–100.0)
MONO ABS: 0.6 10*3/uL (ref 0.1–1.0)
MONOS PCT: 6 %
Neutro Abs: 5.2 10*3/uL (ref 1.7–7.7)
Neutrophils Relative %: 57 %
PLATELETS: 257 10*3/uL (ref 150–400)
RBC: 2.72 MIL/uL — ABNORMAL LOW (ref 3.87–5.11)
RDW: 15.3 % (ref 11.5–15.5)
WBC: 9.1 10*3/uL (ref 4.0–10.5)

## 2017-07-29 LAB — CBC
HEMATOCRIT: 35.3 % — AB (ref 36.0–46.0)
HEMOGLOBIN: 11.3 g/dL — AB (ref 12.0–15.0)
MCH: 28.7 pg (ref 26.0–34.0)
MCHC: 32 g/dL (ref 30.0–36.0)
MCV: 89.6 fL (ref 78.0–100.0)
Platelets: 243 10*3/uL (ref 150–400)
RBC: 3.94 MIL/uL (ref 3.87–5.11)
RDW: 15.2 % (ref 11.5–15.5)
WBC: 7.8 10*3/uL (ref 4.0–10.5)

## 2017-07-29 LAB — PREPARE RBC (CROSSMATCH)

## 2017-07-29 LAB — GLUCOSE, CAPILLARY: Glucose-Capillary: 82 mg/dL (ref 65–99)

## 2017-07-29 MED ORDER — SODIUM CHLORIDE 0.9 % IV SOLN
Freq: Once | INTRAVENOUS | Status: AC
  Administered 2017-07-29: 08:00:00 via INTRAVENOUS

## 2017-07-29 MED ORDER — PANTOPRAZOLE SODIUM 40 MG PO TBEC
40.0000 mg | DELAYED_RELEASE_TABLET | Freq: Two times a day (BID) | ORAL | Status: DC
  Administered 2017-07-29: 40 mg via ORAL
  Filled 2017-07-29: qty 1

## 2017-07-29 NOTE — Progress Notes (Signed)
The patient is receiving Protonix by the intravenous route.  Based on criteria approved by the Pharmacy and [place], the medication is being converted to the equivalent oral dose form.  These criteria include: -No active GI bleeding -Able to tolerate diet of full liquids (or better) or tube feeding -Able to tolerate other medications by the oral or enteral route  If you have any questions about this conversion, please contact the Pharmacy Department (phone [phone]).  Thank you.   Koleen Nimrod, PharmD Candidate

## 2017-07-29 NOTE — Progress Notes (Signed)
Date: July 29, 2017 Chart reviewed for discharge orders: None found for case management. Vernia Buff, [phone]

## 2017-07-29 NOTE — Progress Notes (Signed)
[facility] Progress Note     Subjective: CC: No complaints Patient eating well and tolerating soft diet. Having BMs, denies nausea and vomiting. TPN held while here, and prealbumin 15. Patient wanting to know if she could stop home TPN and get PICC removed now that she is eating better and gaining weight. She has an appointment to see Dr. Rosendo Gros in the office on Monday.   Objective: Vital signs in last 24 hours: Temp:  [97.9 F (36.6 C)-98.3 F (36.8 C)] 97.9 F (36.6 C) (08/22 2345) Pulse Rate:  [105-107] 105 (08/22 2345) Resp:  [18] 18 (08/22 1340) BP: (109-129)/(63-77) 109/63 (08/22 2345) SpO2:  [97 %-100 %] 97 % (08/22 2345) Last BM Date: 07/28/17  Intake/Output from previous day: 08/22 0701 - 08/23 0700 In: 824 [P.O.:120; I.V.:815] Out: -  Intake/Output this shift: No intake/output data recorded.  PE: Gen:  Alert, NAD, pleasant Card:  Regular rate and rhythm, pedal pulses 2+ BL Pulm:  Normal effort, clear to auscultation bilaterally Abd: Soft, non-tender, non-distended, bowel sounds present in all 4 quadrants, no HSM Skin: warm and dry, no rashes  Psych: A&Ox3   Lab Results:   Recent Labs  07/28/17 0500 07/29/17 0327  WBC 4.2 9.1  HGB 8.2* 7.5*  HCT 25.6* 23.9*  PLT 250 257   BMET  Recent Labs  07/28/17 0500 07/29/17 0327  NA 137 138  K 5.3* 3.9  CL 113* 115*  CO2 19* 16*  GLUCOSE 115* 95  BUN 31* 25*  CREATININE 1.15* 1.18*  CALCIUM 8.4* 8.1*   CMP     Component Value Date/Time   NA 138 07/29/2017 0327   K 3.9 07/29/2017 0327   CL 115 (H) 07/29/2017 0327   CO2 16 (L) 07/29/2017 0327   GLUCOSE 95 07/29/2017 0327   BUN 25 (H) 07/29/2017 0327   CREATININE 1.18 (H) 07/29/2017 0327   CREATININE 1.37 (H) 03/01/2017 1149   CALCIUM 8.1 (L) 07/29/2017 0327   PROT 7.0 07/28/2017 0500   ALBUMIN 2.8 (L) 07/28/2017 0500   AST 21 07/28/2017 0500   ALT 15 07/28/2017 0500   ALKPHOS 102 07/28/2017 0500   BILITOT 0.4 07/28/2017 0500    GFRNONAA 46 (L) 07/29/2017 0327   GFRNONAA 42 (L) 11/15/2013 0934   GFRAA 53 (L) 07/29/2017 0327   GFRAA 49 (L) 11/15/2013 0934   Lipase     Component Value Date/Time   LIPASE 27 07/27/2017 1323     Studies/Results: Dg Abd 1 View  Result Date: 07/28/2017 CLINICAL DATA:  Follow-up of small bowel obstruction. EXAM: ABDOMEN - 1 VIEW COMPARISON:  Abdominal and pelvic CT scan of July 27, 2017 FINDINGS: There is a moderate amount of gas within what appears to be the left colon. No significant small bowel distention is observed today. No free extraluminal gas collections are observed. There is a moderate amount of contrast within the urinary bladder. IMPRESSION: Moderate amount of gas within the left colon without objective evidence of small bowel ileus or obstruction. Electronically Signed   By: Ariah Mower  [place] M.D.   On: 07/28/2017 09:38   Ct Abdomen Pelvis W Contrast  Result Date: 07/27/2017 CLINICAL DATA:  Patient is complaining of left upper and lower quadrant pain. Question Crohn's exacerbation. EXAM: CT ABDOMEN AND PELVIS WITH CONTRAST TECHNIQUE: Multidetector CT imaging of the abdomen and pelvis was performed using the standard protocol following bolus administration of intravenous contrast. CONTRAST:  39mL ISOVUE-300 IOPAMIDOL (ISOVUE-300) INJECTION 61% COMPARISON:  June 14, 2017 FINDINGS: Lower  chest: There is a very large hiatal hernia which will be described below. The hiatal hernia displaces the heart anteriorly. Lung bases are normal. Hepatobiliary: The patient is status post cholecystectomy. No liver lesions identified. The portal vein is patent. Pancreas: Unremarkable. No pancreatic ductal dilatation or surrounding inflammatory changes. Spleen: Normal in size without focal abnormality. Adrenals/Urinary Tract: Adrenal glands are normal. The kidneys, ureters, and bladder are normal. Stomach/Bowel: The patient has a very large hiatal hernia. The visualized portion of the stomach is otherwise  normal. The duodenum and proximal jejunum are normal as well. The mid jejunum then extends superiorly into the hiatal hernia. The entire extend of jejunum within the hernia is not included on today's study. However, there are dilated loops of bowel within the hernia. The dilatation extends to a region of thickening and inflammation in the bowel of the right lower mid abdomen on coronal image 44 on axial image 30. Distal to this transition point, fluid-filled bowel extends back into the hiatal hernia before exiting the hernia and connecting to the descending colon. The descending colon is fluid-filled along its length extending to the level of the rectum. Another apparent transition point is associated with a loop of dilated bowel in the hernia as seen on coronal image 83. It is difficult to follow the decompressed bowel beyond this level. Vascular/Lymphatic: No significant vascular findings are present. No enlarged abdominal or pelvic lymph nodes. Reproductive: Uterus and bilateral adnexa are unremarkable. Other: Increased attenuation in the fat of the perineum is stable. Musculoskeletal: No acute or significant osseous findings. IMPRESSION: 1. There is a complicated bowel obstruction. There is a transition point in the lower abdomen, just to the right of midline as seen on axial image 30. There appears to be focal inflammation in this region and this could represent a site of Crohn's exacerbation resulting in the obstruction. Distal to this site, the colon is fluid-filled and smaller but not completely decompressed in caliber. The dilated loops of bowel within the hiatal hernia are not completely imaged today. There is suggestion of other transition points which may be contributing to the small bowel obstruction within the hernia as above. 2. No other interval changes or acute abnormalities. Electronically Signed   By: Dorise Bullion III M.D   On: 07/27/2017 17:01     Assessment/Plan SBO             - XR:  Moderate amount of gas within the left colon without objective evidence of small bowel ileus or obstruction             - CT 8/54: complicated bowel obstruction. There is a transition point in the lower abdomen, just to the right of midline as seen on axial image 30. There appears to be focal inflammation in this region and this could represent a site of Crohn's exacerbation resulting in the obstruction. Distal to this site, the colon is fluid-filled and smaller but not completely decompressed in caliber. - clinically improving and XR this AM improved - start clears and possibly advance to full liquids vs soft diet tomorrow - Does not need to be on home TPN for surgical purposes Hiatal hernia             Has seen Dr. Rosendo Gros in our office about this. Crohn's disease, active             Hx of bowel obstructions  FEN - SOFT diet VTE - SCDs, lovenox ID - no current abx  Plan: Stable  for discharge home from a surgical standpoint. Okay to discontinue home TPN and PICC line.   LOS: 2 days    Brigid Re , [facility] 07/29/2017, 8:58 AM Pager: [phone] Consults: [phone]  Agree with above. She is to see Dr. Rosendo Gros next week.  Alphonsa Overall, MD, [facility] Pager: [phone] Office phone:  [phone]

## 2017-07-29 NOTE — Discharge Summary (Signed)
Physician Discharge Summary  Danielle [name]  NTZ:[id number]  DOB: [birth year]  DOA: 07/27/2017 PCP: Iona Beard, MD  Admit date: 07/27/2017 Discharge date: 07/29/2017  Admitted From: Home  Disposition:  Home   Recommendations for Outpatient Follow-up:  1. Follow up with PCP in 1 week 2. Please obtain BMP/CBC in one week to monitor Hgb and renal function   Discharge Condition: Stable  CODE STATUS: FULL  Diet recommendation: Heart Healthy Soft - advance as tolerated   Brief/Interim Summary: Danielle [name] is a 71 year old female with medical history significant for Crohn's disease, status post bowel resection, hiatal hernia, iron deficiency anemia. Presents to the emergency department complaining of nausea vomiting and abdominal pain. Upon ED evaluation she was found to have small bowel obstruction and she was admitted for further evaluation. She was placed NPO and given pain control. Her GI recommended to give solumedrol IV. Patient subsequently began to pass gas, had normal bowel movement and tolerated diet well. General surgery was consulted whom recommended conservative management. During hospital stay it was noted low hgb and she was transfuse 1 unit of PRBC's. Patient clinically improved and tolerating diet well, she will be discharge home to follow up with gen surgery and PCP.   Subjective: Patient seen and examined on the day of discharge. Patient report feeling much better, tolerating diet and passing gas. She had a normal BM. No acute events overnight. Remains afebrile. Denies abdominal pain, nausea, vomiting and diarrhea.   Discharge Diagnoses/Hospital Course:  SBO - Resolved  CT abdomen and pelvis show complicated bowel obstruction Surgical team was consulted recommended conservative management - nothing by mouth, pain control and IV fluids She's now passing gas and having bowel movement. Will advance diet as tolerated. 8/22 Abdominal x-ray shows resolution of  obstruction Follow up with [facility]   Crohn's diseases GI was called and recommended IV Solu-Medrol 40 mg twice a day, I did not think that this is Crohn's flare, She received Solumedrol for 2 days, this will not be continued upon discharge   Anemia iron deficiency - 2/2 crohn's  The day of discharge Hgb was found to be 7.5  She was transfuse 1 unit of PRBC's  H/H post transfusion 11.3 Monitor CBC in 1 week   Hyperkalemia - resolved  Treated with IVF   AKI - Improved  Secondary to dehydration  Cr improved but not at baseline upon discharge  Monitor BMP in 1 week   Severe malnutrition  Patient was on TPN PTA  Patient eating well, per surgical recommendation no further TPN needed, PICC line discontinued  Advance diet as tolerated  Follow up with PCP   All other chronic medical condition were stable during the hospitalization.  On the day of the discharge the patient's vitals were stable, and no other acute medical condition were reported by patient. Patient was felt safe to be discharge to home   Discharge Instructions  You were cared for by a hospitalist during your hospital stay. If you have any questions about your discharge medications or the care you received while you were in the hospital after you are discharged, you can call the unit and asked to speak with the hospitalist on call if the hospitalist that took care of you is not available. Once you are discharged, your primary care physician will handle any further medical issues. Please note that NO REFILLS for any discharge medications will be authorized once you are discharged, as it is imperative that you return to  your primary care physician (or establish a relationship with a primary care physician if you do not have one) for your aftercare needs so that they can reassess your need for medications and monitor your lab values.  Discharge Instructions    Call MD for:  difficulty breathing, headache or  visual disturbances    Complete by:  As directed    Call MD for:  extreme fatigue    Complete by:  As directed    Call MD for:  hives    Complete by:  As directed    Call MD for:  persistant dizziness or light-headedness    Complete by:  As directed    Call MD for:  persistant nausea and vomiting    Complete by:  As directed    Call MD for:  redness, tenderness, or signs of infection (pain, swelling, redness, odor or green/yellow discharge around incision site)    Complete by:  As directed    Call MD for:  severe uncontrolled pain    Complete by:  As directed    Call MD for:  temperature >100.4    Complete by:  As directed    Diet - low sodium heart healthy    Complete by:  As directed    Increase activity slowly    Complete by:  As directed      Allergies as of 07/29/2017      Reactions   Zithromax [azithromycin] Swelling, Other (See Comments)   Reaction:  Mouth swelling and blisters    Aspirin Other (See Comments)   Reaction:  Stomach cramping    Ibuprofen Other (See Comments)   Pt states that she prefers not to take this medication.    Sulfa Antibiotics Nausea And Vomiting      Medication List    TAKE these medications   albuterol 108 (90 Base) MCG/ACT inhaler Commonly known as:  PROVENTIL HFA;VENTOLIN HFA Inhale 2 puffs into the lungs every 6 (six) hours as needed for wheezing.   capsicum oleoresin 0.025 % cream Commonly known as:  TRIXAICIN Apply topically 3 (three) times daily. What changed:  how much to take  when to take this  reasons to take this   cyanocobalamin 1000 MCG/ML injection Commonly known as:  (VITAMIN B-12) INJECT 1000 MCG IM ONCE MONTHLY   dicyclomine 10 MG/5ML syrup Commonly known as:  BENTYL TAKE 10 MLS (20 MG TOTAL) BY MOUTH 3 (THREE) TIMES DAILY AS NEEDED. What changed:  reasons to take this   diphenoxylate-atropine 2.5-0.025 MG tablet Commonly known as:  LOMOTIL TAKE 1 TABLET BY MOUTH FOUR TIMES A DAY AS NEEDED What changed:   See the new instructions.   feeding supplement Liqd Take 1 Container by mouth 3 (three) times daily between meals.   ferrous sulfate 325 (65 FE) MG EC tablet Take 325 mg by mouth 3 (three) times daily with meals.   furosemide 20 MG tablet Commonly known as:  LASIX Take 1 tablet (20 mg total) by mouth daily as needed (Take for weight gain of more than 2 pounds in a day or 5 pounds in 2 days.).   HYDROcodone-acetaminophen 7.5-325 MG tablet Commonly known as:  NORCO Take 1 tablet by mouth every 12 (twelve) hours as needed for moderate pain.   nystatin powder Generic drug:  nystatin Apply 1 g topically 2 (two) times daily as needed (for itching/irritation).   pantoprazole 40 MG tablet Commonly known as:  PROTONIX Take 1 tablet (40 mg total) by mouth daily.  PHAZYME PO Take 1 tablet by mouth daily as needed (gas).   promethazine 25 MG tablet Commonly known as:  PHENERGAN Take 1 tablet (25 mg total) by mouth 2 (two) times daily as needed for nausea or vomiting.   sodium bicarbonate 650 MG tablet TAKE 1 TABLET (650 MG TOTAL) BY MOUTH 2 (TWO) TIMES DAILY.   spironolactone 50 MG tablet Commonly known as:  ALDACTONE Take 50 mg by mouth daily.            Discharge Care Instructions        Start     Ordered   07/29/17 0000  Increase activity slowly     07/29/17 1334   07/29/17 0000  Diet - low sodium heart healthy     07/29/17 1334   07/29/17 0000  Call MD for:  temperature >100.4     07/29/17 1334   07/29/17 0000  Call MD for:  persistant nausea and vomiting     07/29/17 1334   07/29/17 0000  Call MD for:  severe uncontrolled pain     07/29/17 1334   07/29/17 0000  Call MD for:  redness, tenderness, or signs of infection (pain, swelling, redness, odor or green/yellow discharge around incision site)     07/29/17 1334   07/29/17 0000  Call MD for:  difficulty breathing, headache or visual disturbances     07/29/17 1334   07/29/17 0000  Call MD for:  hives      07/29/17 1334   07/29/17 0000  Call MD for:  persistant dizziness or light-headedness     07/29/17 1334   07/29/17 0000  Call MD for:  extreme fatigue     07/29/17 1334     Follow-up Information    Iona Beard, MD. Schedule an appointment as soon as possible for a visit in 1 week(s).   Specialty:  Family Medicine Why:  Hospital follow up  Contact information: [address] [place] [address] [phone]        Ralene Ok, MD. Schedule an appointment as soon as possible for a visit in 1 week(s).   Specialty:  General Surgery Why:  Hospital follow up  Contact information: [address]  [place] [address] [phone]          Allergies  Allergen Reactions  . Zithromax [Azithromycin] Swelling and Other (See Comments)    Reaction:  Mouth swelling and blisters   . Aspirin Other (See Comments)    Reaction:  Stomach cramping   . Ibuprofen Other (See Comments)    Pt states that she prefers not to take this medication.   . Sulfa Antibiotics Nausea And Vomiting    Consultations: Gen Surgery   Procedures/Studies: Dg Abd 1 View  Result Date: 07/28/2017 CLINICAL DATA:  Follow-up of small bowel obstruction. EXAM: ABDOMEN - 1 VIEW COMPARISON:  Abdominal and pelvic CT scan of July 27, 2017 FINDINGS: There is a moderate amount of gas within what appears to be the left colon. No significant small bowel distention is observed today. No free extraluminal gas collections are observed. There is a moderate amount of contrast within the urinary bladder. IMPRESSION: Moderate amount of gas within the left colon without objective evidence of small bowel ileus or obstruction. Electronically Signed   By: David  Martinique M.D.   On: 07/28/2017 09:38   Ct Abdomen Pelvis W Contrast  Result Date: 07/27/2017 CLINICAL DATA:  Patient is complaining of left upper and lower quadrant  pain. Question Crohn's exacerbation. EXAM: CT ABDOMEN AND PELVIS WITH CONTRAST TECHNIQUE:  Multidetector CT imaging of the abdomen and pelvis was performed using the standard protocol following bolus administration of intravenous contrast. CONTRAST:  62mL ISOVUE-300 IOPAMIDOL (ISOVUE-300) INJECTION 61% COMPARISON:  June 14, 2017 FINDINGS: Lower chest: There is a very large hiatal hernia which will be described below. The hiatal hernia displaces the heart anteriorly. Lung bases are normal. Hepatobiliary: The patient is status post cholecystectomy. No liver lesions identified. The portal vein is patent. Pancreas: Unremarkable. No pancreatic ductal dilatation or surrounding inflammatory changes. Spleen: Normal in size without focal abnormality. Adrenals/Urinary Tract: Adrenal glands are normal. The kidneys, ureters, and bladder are normal. Stomach/Bowel: The patient has a very large hiatal hernia. The visualized portion of the stomach is otherwise normal. The duodenum and proximal jejunum are normal as well. The mid jejunum then extends superiorly into the hiatal hernia. The entire extend of jejunum within the hernia is not included on today's study. However, there are dilated loops of bowel within the hernia. The dilatation extends to a region of thickening and inflammation in the bowel of the right lower mid abdomen on coronal image 44 on axial image 30. Distal to this transition point, fluid-filled bowel extends back into the hiatal hernia before exiting the hernia and connecting to the descending colon. The descending colon is fluid-filled along its length extending to the level of the rectum. Another apparent transition point is associated with a loop of dilated bowel in the hernia as seen on coronal image 83. It is difficult to follow the decompressed bowel beyond this level. Vascular/Lymphatic: No significant vascular findings are present. No enlarged abdominal or pelvic lymph nodes. Reproductive: Uterus and bilateral adnexa are unremarkable. Other: Increased attenuation in the fat of the perineum is  stable. Musculoskeletal: No acute or significant osseous findings. IMPRESSION: 1. There is a complicated bowel obstruction. There is a transition point in the lower abdomen, just to the right of midline as seen on axial image 30. There appears to be focal inflammation in this region and this could represent a site of Crohn's exacerbation resulting in the obstruction. Distal to this site, the colon is fluid-filled and smaller but not completely decompressed in caliber. The dilated loops of bowel within the hiatal hernia are not completely imaged today. There is suggestion of other transition points which may be contributing to the small bowel obstruction within the hernia as above. 2. No other interval changes or acute abnormalities. Electronically Signed   By: Dorise Bullion III M.D   On: 07/27/2017 17:01     Discharge Exam: Vitals:   07/29/17 1045 07/29/17 1311  BP: 110/64 114/67  Pulse: (!) 102 96  Resp: 17 14  Temp: 97.6 F (36.4 C) 98 F (36.7 C)  SpO2: 100% 100%   Vitals:   07/29/17 1016 07/29/17 1030 07/29/17 1045 07/29/17 1311  BP: 106/64 106/64 110/64 114/67  Pulse: 100 100 (!) 102 96  Resp: 18 18 17 14   Temp: 98.5 F (36.9 C) 98.5 F (36.9 C) 97.6 F (36.4 C) 98 F (36.7 C)  TempSrc: Axillary Axillary Oral Oral  SpO2: 100% 100% 100% 100%  Weight:      Height:        General: Pt is alert, awake, not in acute distress Cardiovascular: RRR, S1/S2 +, no rubs, no gallops Respiratory: CTA bilaterally, no wheezing, no rhonchi Abdominal: Soft, NT, ND, bowel sounds + Extremities: no edema, no cyanosis    The results of  significant diagnostics from this hospitalization (including imaging, microbiology, ancillary and laboratory) are listed below for reference.     Microbiology: Recent Results (from the past 240 hour(s))  MRSA PCR Screening     Status: Abnormal   Collection Time: 07/28/17  1:26 AM  Result Value Ref Range Status   MRSA by PCR POSITIVE (A) NEGATIVE Final     Comment:        The GeneXpert MRSA Assay (FDA approved for NASAL specimens only), is one component of a comprehensive MRSA colonization surveillance program. It is not intended to diagnose MRSA infection nor to guide or monitor treatment for MRSA infections. RESULT CALLED TO, READ BACK BY AND VERIFIED WITH: FRASER,B @ 1723 ON [id number] BY POTEAT,S      Labs: BNP (last 3 results)  Recent Labs  05/07/17 0024  BNP 87.5   Basic Metabolic Panel:  Recent Labs Lab 07/27/17 1323 07/27/17 1851 07/28/17 0500 07/29/17 0327  NA 134* 134* 137 138  K 5.6* 5.0 5.3* 3.9  CL 104 109 113* 115*  CO2 21* 18* 19* 16*  GLUCOSE 103* 104* 115* 95  BUN 42* 32* 31* 25*  CREATININE 1.26* 1.17* 1.15* 1.18*  CALCIUM 8.9 8.5* 8.4* 8.1*  MG  --   --  1.9  --   PHOS  --   --  4.1  --    Liver Function Tests:  Recent Labs Lab 07/27/17 1323 07/28/17 0500  AST 26 21  ALT 18 15  ALKPHOS 114 102  BILITOT 0.5 0.4  PROT 7.2 7.0  ALBUMIN 2.9* 2.8*    Recent Labs Lab 07/27/17 1323  LIPASE 27   No results for input(s): AMMONIA in the last 168 hours. CBC:  Recent Labs Lab 07/27/17 1323 07/28/17 0500 07/29/17 0327  WBC 9.1 4.2 9.1  NEUTROABS 5.5 2.4 5.2  HGB 8.8* 8.2* 7.5*  HCT 27.0* 25.6* 23.9*  MCV 86.3 87.4 87.9  PLT 260 250 257   Cardiac Enzymes: No results for input(s): CKTOTAL, CKMB, CKMBINDEX, TROPONINI in the last 168 hours. BNP: Invalid input(s): POCBNP CBG:  Recent Labs Lab 07/28/17 2344 07/29/17 0506  GLUCAP 117* 82   D-Dimer No results for input(s): DDIMER in the last 72 hours. Hgb A1c No results for input(s): HGBA1C in the last 72 hours. Lipid Profile  Recent Labs  07/28/17 0500  TRIG 45   Thyroid function studies No results for input(s): TSH, T4TOTAL, T3FREE, THYROIDAB in the last 72 hours.  Invalid input(s): FREET3 Anemia work up No results for input(s): VITAMINB12, FOLATE, FERRITIN, TIBC, IRON, RETICCTPCT in the last 72 hours. Urinalysis     Component Value Date/Time   COLORURINE YELLOW 06/08/2017 1539   APPEARANCEUR CLOUDY (A) 06/08/2017 1539   LABSPEC 1.010 06/08/2017 1539   PHURINE 6.0 06/08/2017 1539   GLUCOSEU NEGATIVE 06/08/2017 1539   HGBUR MODERATE (A) 06/08/2017 1539   BILIRUBINUR NEGATIVE 06/08/2017 1539   KETONESUR NEGATIVE 06/08/2017 1539   PROTEINUR 30 (A) 06/08/2017 1539   UROBILINOGEN 0.2 07/29/2015 2231   NITRITE NEGATIVE 06/08/2017 1539   LEUKOCYTESUR LARGE (A) 06/08/2017 1539   Sepsis Labs Invalid input(s): PROCALCITONIN,  WBC,  LACTICIDVEN Microbiology Recent Results (from the past 240 hour(s))  MRSA PCR Screening     Status: Abnormal   Collection Time: 07/28/17  1:26 AM  Result Value Ref Range Status   MRSA by PCR POSITIVE (A) NEGATIVE Final    Comment:        The GeneXpert MRSA Assay (FDA approved for NASAL  specimens only), is one component of a comprehensive MRSA colonization surveillance program. It is not intended to diagnose MRSA infection nor to guide or monitor treatment for MRSA infections. RESULT CALLED TO, READ BACK BY AND VERIFIED WITH: FRASER,B @ 1723 ON [id number] BY POTEAT,S      Time coordinating discharge: 35 minutes  SIGNED:  Chipper Oman, MD  [facility] 07/29/2017, 1:35 PM  Pager please text page via  [email or web address] Password [email or web address]

## 2017-07-30 LAB — TYPE AND SCREEN
ABO/RH(D): O POS
ANTIBODY SCREEN: NEGATIVE
UNIT DIVISION: 0

## 2017-07-30 LAB — BPAM RBC
BLOOD PRODUCT EXPIRATION DATE: 201809252359
ISSUE DATE / TIME: 201808231028
UNIT TYPE AND RH: 5100

## 2017-08-03 DIAGNOSIS — D510 Vitamin B12 deficiency anemia due to intrinsic factor deficiency: Secondary | ICD-10-CM | POA: Diagnosis not present

## 2017-08-03 DIAGNOSIS — Z0000 Encounter for general adult medical examination without abnormal findings: Secondary | ICD-10-CM | POA: Diagnosis not present

## 2017-08-03 DIAGNOSIS — N390 Urinary tract infection, site not specified: Secondary | ICD-10-CM | POA: Diagnosis not present

## 2017-08-03 DIAGNOSIS — K5000 Crohn's disease of small intestine without complications: Secondary | ICD-10-CM | POA: Diagnosis not present

## 2017-08-06 DIAGNOSIS — D649 Anemia, unspecified: Secondary | ICD-10-CM | POA: Diagnosis not present

## 2017-08-06 DIAGNOSIS — R1310 Dysphagia, unspecified: Secondary | ICD-10-CM | POA: Diagnosis not present

## 2017-08-06 DIAGNOSIS — K449 Diaphragmatic hernia without obstruction or gangrene: Secondary | ICD-10-CM | POA: Diagnosis not present

## 2017-08-06 DIAGNOSIS — L409 Psoriasis, unspecified: Secondary | ICD-10-CM | POA: Diagnosis not present

## 2017-08-06 DIAGNOSIS — K219 Gastro-esophageal reflux disease without esophagitis: Secondary | ICD-10-CM | POA: Diagnosis not present

## 2017-08-06 DIAGNOSIS — Z452 Encounter for adjustment and management of vascular access device: Secondary | ICD-10-CM | POA: Diagnosis not present

## 2017-08-06 DIAGNOSIS — E43 Unspecified severe protein-calorie malnutrition: Secondary | ICD-10-CM | POA: Diagnosis not present

## 2017-08-06 DIAGNOSIS — K5090 Crohn's disease, unspecified, without complications: Secondary | ICD-10-CM | POA: Diagnosis not present

## 2017-08-06 DIAGNOSIS — N183 Chronic kidney disease, stage 3 (moderate): Secondary | ICD-10-CM | POA: Diagnosis not present

## 2017-08-12 ENCOUNTER — Ambulatory Visit: Payer: Self-pay | Admitting: General Surgery

## 2017-08-12 DIAGNOSIS — K449 Diaphragmatic hernia without obstruction or gangrene: Secondary | ICD-10-CM | POA: Diagnosis not present

## 2017-08-12 NOTE — H&P (Signed)
History of Present Illness Danielle Ok MD; 08/12/2017 11:57 AM) The patient is a 71 year old female who presents with a hiatal hernia. Patient is a 71 year old female who is recently seen secondary to her hiatal hernia. Since the last clinic visit the patient was admitted to the hospital for small bowel structure. This appeared to resolve spontaneously. Patient states that her TPN has been canceled. She's been eating normally. She's had no dysphasia. She continues with breeze supplemental drinks.  Patient's most recent prealbumin was 14.9, her recent albumin was 2.8. Patient's appetite is appeared improved since this time. Patient does take Remicade every 6 weeks.   Allergies Malachy Moan, [place]; 08/12/2017 11:34 AM) Azucena Fallen Tri-Pak *MACROLIDES*  Aspirin *ANALGESICS - NonNarcotic*  Ciprofloxacin *CHEMICALS*  Sulfa 10 *OPHTHALMIC AGENTS*  Allergies Reconciled   Medication History Malachy Moan, RMA; 08/12/2017 11:34 AM) Protonix (Oral) Specific strength unknown - Active. Hydrocodone-Acetaminophen (7.5-325MG  Tablet, Oral) Active. Potassium Chloride (20 MEQ/15ML(10%) Solution, Oral) Active. Dicyclomine HCl (10MG /5ML Solution, Oral) Active. Zolpidem Tartrate (10MG  Tablet, Oral) Active. Diphenoxylate-Atropine (2.5-0.025MG  Tablet, Oral) Active. Clobetasol Propionate (0.05% Ointment, External) Active. Cyanocobalamin (1000MCG/ML Solution, Injection) Active. Desonide (0.05% Ointment, External) Active. Fluconazole (150MG  Tablet, Oral) Active. Fluconazole (100MG  Tablet, Oral) Active. Fluocinonide (0.05% Ointment, External) Active. Furosemide (20MG  Tablet, Oral) Active. Magnesium Oxide (400 (241.3 Mg)MG Tablet, Oral) Active. Klor-Con M20 [facility] Tablet ER, Oral) Active. MetroNIDAZOLE (250MG  Tablet, Oral) Active. Nystatin (100000 UNIT/GM Cream, External) Active. Nystop (100000 UNIT/GM Powder, External) Active. Ondansetron HCl (4MG  Tablet, Oral)  Active. Pantoprazole Sodium (40MG  Tablet DR, Oral) Active. Phospha 250 Neutral (155-852-130MG  Tablet, Oral) Active. Promethazine HCl (12.5MG  Tablet, Oral) Active. Sodium Bicarbonate (650MG  Tablet, Oral) Active. Triamcinolone Acetonide (0.1% Cream, External) Active. Medications Reconciled    Review of Systems Danielle Ok, MD; 08/12/2017 11:54 AM) General Not Present- Appetite Loss, Chills, Fatigue, Fever, Night Sweats, Weight Gain and Weight Loss. Skin Not Present- Change in Wart/Mole, Dryness, Hives, Jaundice, New Lesions, Non-Healing Wounds, Rash and Ulcer. HEENT Present- Wears glasses/contact lenses. Not Present- Earache, Hearing Loss, Hoarseness, Nose Bleed, Oral Ulcers, Ringing in the Ears, Seasonal Allergies, Sinus Pain, Sore Throat, Visual Disturbances and Yellow Eyes. Breast Not Present- Breast Mass, Breast Pain, Nipple Discharge and Skin Changes. Cardiovascular Not Present- Chest Pain, Difficulty Breathing Lying Down, Leg Cramps, Palpitations, Rapid Heart Rate, Shortness of Breath and Swelling of Extremities. Gastrointestinal Not Present- Abdominal Pain, Bloating, Bloody Stool, Change in Bowel Habits, Chronic diarrhea, Constipation, Difficulty Swallowing, Excessive gas, Gets full quickly at meals, Hemorrhoids, Indigestion, Nausea, Rectal Pain and Vomiting. Musculoskeletal Not Present- Back Pain, Joint Pain, Joint Stiffness, Muscle Pain, Muscle Weakness and Swelling of Extremities. Neurological Not Present- Decreased Memory, Fainting, Headaches, Numbness, Seizures, Tingling, Tremor, Trouble walking and Weakness. Psychiatric Not Present- Anxiety, Bipolar, Change in Sleep Pattern, Depression, Fearful and Frequent crying. Endocrine Not Present- Cold Intolerance, Excessive Hunger, Hair Changes, Heat Intolerance and New Diabetes. Hematology Not Present- Blood Thinners, Easy Bruising, Excessive bleeding, Gland problems, HIV and Persistent Infections. All other systems  negative  Vitals Malachy Moan RMA; 08/12/2017 11:34 AM) 08/12/2017 11:34 AM Weight: 125.2 lb Height: 64in Body Surface Area: 1.6 m Body Mass Index: 21.49 kg/m  Temp.: 98.55F  Pulse: 79 (Regular)  BP: 100/60 (Sitting, Left Arm, Standard)       Physical Exam Danielle Ok, MD; 08/12/2017 11:54 AM) The physical exam findings are as follows: Note: Constitutional: No acute distress, conversant, appears stated age  Eyes: Anicteric sclerae, moist conjunctiva, no lid lag  Neck: No thyromegaly, trachea midline, no cervical lymphadenopathy  Lungs: Clear to auscultation  biilaterally, normal respiratory effot  Cardiovascular: regular rate & rhythm, no murmurs, no peripheal edema, pedal pulses 2+  GI: Soft, no masses or hepatosplenomegaly, non-tender to palpation. Large midline incision  MSK: Normal gait, no clubbing cyanosis, edema  Skin: No rashes, palpation reveals normal skin turgor  Psychiatric: Appropriate judgment and insight, oriented to person, place, and time    Assessment & Plan Danielle Ok MD; 08/12/2017 11:59 AM) HIATAL HERNIA (K44.9) Impression: 71 year old female with a past medical history significant for Crohn's and hiatal hernia. 1. Will proceed to the operating room for laparoscopic lysis of adhesions, laparoscopic hiatal hernia repair and Nissen versus Toupet fundoplication. 2. Discussion of the risks and benefits of the procedure to include but not limited to: Infection, bleeding, damage to structures, possible pneumothorax, possible esophageal injury. Patient voiced understanding and wishes to proceed.

## 2017-08-17 ENCOUNTER — Other Ambulatory Visit (INDEPENDENT_AMBULATORY_CARE_PROVIDER_SITE_OTHER): Payer: Self-pay | Admitting: Internal Medicine

## 2017-08-19 ENCOUNTER — Encounter (HOSPITAL_COMMUNITY): Admission: RE | Admit: 2017-08-19 | Payer: Medicare HMO | Source: Ambulatory Visit

## 2017-08-26 ENCOUNTER — Encounter (HOSPITAL_COMMUNITY): Payer: Self-pay

## 2017-08-26 ENCOUNTER — Encounter (HOSPITAL_COMMUNITY)
Admission: RE | Admit: 2017-08-26 | Discharge: 2017-08-26 | Disposition: A | Discharge status: Home / Self Care | Payer: Medicare HMO | Source: Ambulatory Visit | Attending: Internal Medicine | Admitting: Internal Medicine

## 2017-08-26 DIAGNOSIS — K5090 Crohn's disease, unspecified, without complications: Secondary | ICD-10-CM | POA: Insufficient documentation

## 2017-08-26 MED ORDER — LORATADINE 10 MG PO TABS
10.0000 mg | ORAL_TABLET | Freq: Once | ORAL | Status: AC
  Administered 2017-08-26: 10 mg via ORAL
  Filled 2017-08-26: qty 1

## 2017-08-26 MED ORDER — ACETAMINOPHEN 325 MG PO TABS
650.0000 mg | ORAL_TABLET | Freq: Once | ORAL | Status: AC
  Administered 2017-08-26: 650 mg via ORAL
  Filled 2017-08-26: qty 2

## 2017-08-26 MED ORDER — SODIUM CHLORIDE 0.9 % IV SOLN
INTRAVENOUS | Status: DC
  Administered 2017-08-26: 12:00:00 via INTRAVENOUS

## 2017-08-26 MED ORDER — SODIUM CHLORIDE 0.9 % IV SOLN
5.0000 mg/kg | Freq: Once | INTRAVENOUS | Status: AC
  Administered 2017-08-26: 300 mg via INTRAVENOUS
  Filled 2017-08-26: qty 30

## 2017-08-31 DIAGNOSIS — K580 Irritable bowel syndrome with diarrhea: Secondary | ICD-10-CM | POA: Diagnosis not present

## 2017-08-31 DIAGNOSIS — K50919 Crohn's disease, unspecified, with unspecified complications: Secondary | ICD-10-CM | POA: Diagnosis not present

## 2017-08-31 DIAGNOSIS — R32 Unspecified urinary incontinence: Secondary | ICD-10-CM | POA: Diagnosis not present

## 2017-09-01 NOTE — Patient Instructions (Addendum)
Danielle [name]  [birth year]   Your procedure is scheduled on: 09-08-17  Report to [facility]  Entrance Take [facility] Elevators to 3rd floor to  [place] at 8:15 AM.   Call this number if you have problems the morning of surgery [phone]    Remember: ONLY 1 PERSON MAY GO WITH YOU TO SHORT STAY TO GET  READY MORNING OF [place].  Do not eat food or drink liquids :After Midnight.     Take these medicines the morning of surgery with A SIP OF WATER: Pantoprazole (Protonix)                                You may not have any metal on your body including hair pins and              piercings  Do not wear jewelry, make-up, lotions, powders or perfumes, deodorant             Do not wear nail polish.  Do not shave  48 hours prior to surgery.               Do not bring valuables to the hospital. [place].  Contacts, dentures or bridgework may not be worn into surgery.       Patients discharged the day of surgery will not be allowed to drive home.  Name and phone number of your driver: Danielle [name] [phone]                Please read over the following fact sheets you were given: _____________________________________________________________________             [facility] - Preparing for Surgery Before surgery, you can play an important role.  Because skin is not sterile, your skin needs to be as free of germs as possible.  You can reduce the number of germs on your skin by washing with CHG (chlorahexidine gluconate) soap before surgery.  CHG is an antiseptic cleaner which kills germs and bonds with the skin to continue killing germs even after washing. Please DO NOT use if you have an allergy to CHG or antibacterial soaps.  If your skin becomes reddened/irritated stop using the CHG and inform your nurse when you arrive at Short Stay. Do not shave (including legs and underarms) for at least 48  hours prior to the first CHG shower.  You may shave your face/neck. Please follow these instructions carefully:  1.  Shower with CHG Soap the night before surgery and the  morning of Surgery.  2.  If you choose to wash your hair, wash your hair first as usual with your  normal  shampoo.  3.  After you shampoo, rinse your hair and body thoroughly to remove the  shampoo.                           4.  Use CHG as you would any other liquid soap.  You can apply chg directly  to the skin and wash                       Gently with a scrungie or clean washcloth.  5.  Apply the CHG Soap to your body ONLY FROM THE NECK DOWN.   Do not use on face/ open                           Wound or open sores. Avoid contact with eyes, ears mouth and genitals (private parts).                       Wash face,  Genitals (private parts) with your normal soap.             6.  Wash thoroughly, paying special attention to the area where your surgery  will be performed.  7.  Thoroughly rinse your body with warm water from the neck down.  8.  DO NOT shower/wash with your normal soap after using and rinsing off  the CHG Soap.                9.  Pat yourself dry with a clean towel.            10.  Wear clean pajamas.            11.  Place clean sheets on your bed the night of your first shower and do not  sleep with pets. Day of Surgery : Do not apply any lotions/deodorants the morning of surgery.  Please wear clean clothes to the hospital/surgery center.  FAILURE TO FOLLOW THESE INSTRUCTIONS MAY RESULT IN THE CANCELLATION OF YOUR SURGERY PATIENT SIGNATURE_________________________________  NURSE SIGNATURE__________________________________  ________________________________________________________________________

## 2017-09-01 NOTE — Progress Notes (Signed)
07-28-17 (EPIC) EKG  06-12-17 (EPIC) CXR  04-23-17 (EPIC) ECHO

## 2017-09-02 ENCOUNTER — Encounter (HOSPITAL_COMMUNITY)
Admission: RE | Admit: 2017-09-02 | Discharge: 2017-09-02 | Disposition: A | Discharge status: Home / Self Care | Payer: Medicare HMO | Source: Ambulatory Visit | Attending: General Surgery | Admitting: General Surgery

## 2017-09-02 ENCOUNTER — Encounter (HOSPITAL_COMMUNITY): Payer: Self-pay

## 2017-09-02 DIAGNOSIS — K449 Diaphragmatic hernia without obstruction or gangrene: Secondary | ICD-10-CM | POA: Insufficient documentation

## 2017-09-02 DIAGNOSIS — Z01812 Encounter for preprocedural laboratory examination: Secondary | ICD-10-CM | POA: Insufficient documentation

## 2017-09-02 LAB — CBC
HCT: 34 % — ABNORMAL LOW (ref 36.0–46.0)
HEMOGLOBIN: 10.8 g/dL — AB (ref 12.0–15.0)
MCH: 28.4 pg (ref 26.0–34.0)
MCHC: 31.8 g/dL (ref 30.0–36.0)
MCV: 89.5 fL (ref 78.0–100.0)
PLATELETS: 247 10*3/uL (ref 150–400)
RBC: 3.8 MIL/uL — ABNORMAL LOW (ref 3.87–5.11)
RDW: 16 % — AB (ref 11.5–15.5)
WBC: 14.2 10*3/uL — ABNORMAL HIGH (ref 4.0–10.5)

## 2017-09-02 LAB — BASIC METABOLIC PANEL
Anion gap: 6 (ref 5–15)
BUN: 18 mg/dL (ref 6–20)
CALCIUM: 8.9 mg/dL (ref 8.9–10.3)
CO2: 18 mmol/L — AB (ref 22–32)
CREATININE: 1.26 mg/dL — AB (ref 0.44–1.00)
Chloride: 118 mmol/L — ABNORMAL HIGH (ref 101–111)
GFR calc Af Amer: 49 mL/min — ABNORMAL LOW (ref >60)
GFR calc non Af Amer: 42 mL/min — ABNORMAL LOW (ref >60)
GLUCOSE: 59 mg/dL — AB (ref 65–99)
POTASSIUM: 3.8 mmol/L (ref 3.5–5.1)
Sodium: 142 mmol/L (ref 135–145)

## 2017-09-02 LAB — SURGICAL PCR SCREEN
MRSA, PCR: POSITIVE — AB
Staphylococcus aureus: POSITIVE — AB

## 2017-09-02 NOTE — Progress Notes (Addendum)
09-02-17 CBC and PCR results routed to Dr. Rosendo Gros for review.   Pt also made aware of PCR results. Pt stated that she did not need Mupirocin prescription called to pharmacy. She tested positive 8-18, and have a tube of the medication, and the medication has not expired. Reiterated to patient how to apply the medication to both nostrils twice a day for 5 days. Pt verbalized understanding.

## 2017-09-08 ENCOUNTER — Encounter (HOSPITAL_COMMUNITY)
Admission: AD | Disposition: A | Discharge status: Home Health | Payer: Self-pay | Source: Home / Self Care | Attending: General Surgery

## 2017-09-08 ENCOUNTER — Ambulatory Visit (HOSPITAL_COMMUNITY): Payer: Medicare HMO | Admitting: Certified Registered Nurse Anesthetist

## 2017-09-08 ENCOUNTER — Encounter (HOSPITAL_COMMUNITY): Payer: Self-pay | Admitting: *Deleted

## 2017-09-08 ENCOUNTER — Inpatient Hospital Stay (HOSPITAL_COMMUNITY)
Admission: AD | Admit: 2017-09-08 | Discharge: 2017-09-26 | DRG: 326 | Disposition: A | Discharge status: Home Health | Payer: Medicare HMO | Attending: Surgery | Admitting: Surgery

## 2017-09-08 DIAGNOSIS — T8149XA Infection following a procedure, other surgical site, initial encounter: Secondary | ICD-10-CM | POA: Diagnosis not present

## 2017-09-08 DIAGNOSIS — Z882 Allergy status to sulfonamides status: Secondary | ICD-10-CM

## 2017-09-08 DIAGNOSIS — G9341 Metabolic encephalopathy: Secondary | ICD-10-CM | POA: Diagnosis not present

## 2017-09-08 DIAGNOSIS — D899 Disorder involving the immune mechanism, unspecified: Secondary | ICD-10-CM | POA: Diagnosis not present

## 2017-09-08 DIAGNOSIS — D72829 Elevated white blood cell count, unspecified: Secondary | ICD-10-CM | POA: Diagnosis not present

## 2017-09-08 DIAGNOSIS — J95821 Acute postprocedural respiratory failure: Secondary | ICD-10-CM | POA: Diagnosis not present

## 2017-09-08 DIAGNOSIS — Z452 Encounter for adjustment and management of vascular access device: Secondary | ICD-10-CM | POA: Diagnosis not present

## 2017-09-08 DIAGNOSIS — R0602 Shortness of breath: Secondary | ICD-10-CM

## 2017-09-08 DIAGNOSIS — K9423 Gastrostomy malfunction: Secondary | ICD-10-CM | POA: Diagnosis not present

## 2017-09-08 DIAGNOSIS — R6521 Severe sepsis with septic shock: Secondary | ICD-10-CM | POA: Diagnosis not present

## 2017-09-08 DIAGNOSIS — R188 Other ascites: Secondary | ICD-10-CM | POA: Diagnosis not present

## 2017-09-08 DIAGNOSIS — J90 Pleural effusion, not elsewhere classified: Secondary | ICD-10-CM | POA: Diagnosis present

## 2017-09-08 DIAGNOSIS — E43 Unspecified severe protein-calorie malnutrition: Secondary | ICD-10-CM | POA: Diagnosis present

## 2017-09-08 DIAGNOSIS — J690 Pneumonitis due to inhalation of food and vomit: Secondary | ICD-10-CM | POA: Diagnosis not present

## 2017-09-08 DIAGNOSIS — Z87891 Personal history of nicotine dependence: Secondary | ICD-10-CM

## 2017-09-08 DIAGNOSIS — K567 Ileus, unspecified: Secondary | ICD-10-CM

## 2017-09-08 DIAGNOSIS — D62 Acute posthemorrhagic anemia: Secondary | ICD-10-CM | POA: Diagnosis not present

## 2017-09-08 DIAGNOSIS — B377 Candidal sepsis: Secondary | ICD-10-CM | POA: Diagnosis not present

## 2017-09-08 DIAGNOSIS — K668 Other specified disorders of peritoneum: Secondary | ICD-10-CM | POA: Diagnosis present

## 2017-09-08 DIAGNOSIS — K659 Peritonitis, unspecified: Secondary | ICD-10-CM | POA: Diagnosis not present

## 2017-09-08 DIAGNOSIS — N823 Fistula of vagina to large intestine: Secondary | ICD-10-CM | POA: Diagnosis present

## 2017-09-08 DIAGNOSIS — K449 Diaphragmatic hernia without obstruction or gangrene: Principal | ICD-10-CM | POA: Insufficient documentation

## 2017-09-08 DIAGNOSIS — B49 Unspecified mycosis: Secondary | ICD-10-CM | POA: Diagnosis not present

## 2017-09-08 DIAGNOSIS — Z881 Allergy status to other antibiotic agents status: Secondary | ICD-10-CM

## 2017-09-08 DIAGNOSIS — I361 Nonrheumatic tricuspid (valve) insufficiency: Secondary | ICD-10-CM | POA: Diagnosis not present

## 2017-09-08 DIAGNOSIS — D696 Thrombocytopenia, unspecified: Secondary | ICD-10-CM | POA: Diagnosis not present

## 2017-09-08 DIAGNOSIS — R6510 Systemic inflammatory response syndrome (SIRS) of non-infectious origin without acute organ dysfunction: Secondary | ICD-10-CM | POA: Diagnosis not present

## 2017-09-08 DIAGNOSIS — G9340 Encephalopathy, unspecified: Secondary | ICD-10-CM | POA: Diagnosis not present

## 2017-09-08 DIAGNOSIS — Z23 Encounter for immunization: Secondary | ICD-10-CM

## 2017-09-08 DIAGNOSIS — J9601 Acute respiratory failure with hypoxia: Secondary | ICD-10-CM | POA: Diagnosis not present

## 2017-09-08 DIAGNOSIS — Z419 Encounter for procedure for purposes other than remedying health state, unspecified: Secondary | ICD-10-CM

## 2017-09-08 DIAGNOSIS — J9690 Respiratory failure, unspecified, unspecified whether with hypoxia or hypercapnia: Secondary | ICD-10-CM | POA: Diagnosis not present

## 2017-09-08 DIAGNOSIS — A419 Sepsis, unspecified organism: Secondary | ICD-10-CM | POA: Diagnosis not present

## 2017-09-08 DIAGNOSIS — Z79899 Other long term (current) drug therapy: Secondary | ICD-10-CM

## 2017-09-08 DIAGNOSIS — R1310 Dysphagia, unspecified: Secondary | ICD-10-CM | POA: Diagnosis present

## 2017-09-08 DIAGNOSIS — K660 Peritoneal adhesions (postprocedural) (postinfection): Secondary | ICD-10-CM | POA: Diagnosis present

## 2017-09-08 DIAGNOSIS — K651 Peritoneal abscess: Secondary | ICD-10-CM | POA: Diagnosis not present

## 2017-09-08 DIAGNOSIS — Z931 Gastrostomy status: Secondary | ICD-10-CM | POA: Diagnosis not present

## 2017-09-08 DIAGNOSIS — R601 Generalized edema: Secondary | ICD-10-CM | POA: Diagnosis not present

## 2017-09-08 DIAGNOSIS — T8143XA Infection following a procedure, organ and space surgical site, initial encounter: Secondary | ICD-10-CM

## 2017-09-08 DIAGNOSIS — J9600 Acute respiratory failure, unspecified whether with hypoxia or hypercapnia: Secondary | ICD-10-CM | POA: Diagnosis not present

## 2017-09-08 DIAGNOSIS — Q398 Other congenital malformations of esophagus: Secondary | ICD-10-CM | POA: Diagnosis not present

## 2017-09-08 DIAGNOSIS — E876 Hypokalemia: Secondary | ICD-10-CM | POA: Diagnosis not present

## 2017-09-08 DIAGNOSIS — K565 Intestinal adhesions [bands], unspecified as to partial versus complete obstruction: Secondary | ICD-10-CM | POA: Diagnosis not present

## 2017-09-08 DIAGNOSIS — Z886 Allergy status to analgesic agent status: Secondary | ICD-10-CM

## 2017-09-08 DIAGNOSIS — E870 Hyperosmolality and hypernatremia: Secondary | ICD-10-CM | POA: Diagnosis not present

## 2017-09-08 DIAGNOSIS — N171 Acute kidney failure with acute cortical necrosis: Secondary | ICD-10-CM | POA: Diagnosis not present

## 2017-09-08 DIAGNOSIS — R630 Anorexia: Secondary | ICD-10-CM | POA: Diagnosis present

## 2017-09-08 DIAGNOSIS — E162 Hypoglycemia, unspecified: Secondary | ICD-10-CM

## 2017-09-08 DIAGNOSIS — T502X5A Adverse effect of carbonic-anhydrase inhibitors, benzothiadiazides and other diuretics, initial encounter: Secondary | ICD-10-CM | POA: Diagnosis not present

## 2017-09-08 DIAGNOSIS — N183 Chronic kidney disease, stage 3 unspecified: Secondary | ICD-10-CM | POA: Diagnosis present

## 2017-09-08 DIAGNOSIS — S36118A Other injury of liver, initial encounter: Secondary | ICD-10-CM | POA: Diagnosis not present

## 2017-09-08 DIAGNOSIS — L409 Psoriasis, unspecified: Secondary | ICD-10-CM | POA: Diagnosis present

## 2017-09-08 DIAGNOSIS — N179 Acute kidney failure, unspecified: Secondary | ICD-10-CM | POA: Diagnosis not present

## 2017-09-08 DIAGNOSIS — E878 Other disorders of electrolyte and fluid balance, not elsewhere classified: Secondary | ICD-10-CM | POA: Diagnosis not present

## 2017-09-08 DIAGNOSIS — L02211 Cutaneous abscess of abdominal wall: Secondary | ICD-10-CM | POA: Diagnosis not present

## 2017-09-08 DIAGNOSIS — R109 Unspecified abdominal pain: Secondary | ICD-10-CM | POA: Diagnosis not present

## 2017-09-08 DIAGNOSIS — Z6821 Body mass index (BMI) 21.0-21.9, adult: Secondary | ICD-10-CM

## 2017-09-08 DIAGNOSIS — K56609 Unspecified intestinal obstruction, unspecified as to partial versus complete obstruction: Secondary | ICD-10-CM | POA: Diagnosis not present

## 2017-09-08 DIAGNOSIS — K50113 Crohn's disease of large intestine with fistula: Secondary | ICD-10-CM | POA: Diagnosis present

## 2017-09-08 DIAGNOSIS — L8990 Pressure ulcer of unspecified site, unspecified stage: Secondary | ICD-10-CM | POA: Insufficient documentation

## 2017-09-08 DIAGNOSIS — Z9889 Other specified postprocedural states: Secondary | ICD-10-CM | POA: Diagnosis not present

## 2017-09-08 DIAGNOSIS — R739 Hyperglycemia, unspecified: Secondary | ICD-10-CM | POA: Diagnosis not present

## 2017-09-08 DIAGNOSIS — Z833 Family history of diabetes mellitus: Secondary | ICD-10-CM

## 2017-09-08 DIAGNOSIS — E872 Acidosis: Secondary | ICD-10-CM | POA: Diagnosis not present

## 2017-09-08 DIAGNOSIS — K650 Generalized (acute) peritonitis: Secondary | ICD-10-CM | POA: Diagnosis not present

## 2017-09-08 DIAGNOSIS — N170 Acute kidney failure with tubular necrosis: Secondary | ICD-10-CM | POA: Diagnosis not present

## 2017-09-08 DIAGNOSIS — E8770 Fluid overload, unspecified: Secondary | ICD-10-CM | POA: Diagnosis not present

## 2017-09-08 DIAGNOSIS — Z431 Encounter for attention to gastrostomy: Secondary | ICD-10-CM | POA: Diagnosis not present

## 2017-09-08 DIAGNOSIS — Z8249 Family history of ischemic heart disease and other diseases of the circulatory system: Secondary | ICD-10-CM

## 2017-09-08 DIAGNOSIS — Z8719 Personal history of other diseases of the digestive system: Secondary | ICD-10-CM | POA: Diagnosis not present

## 2017-09-08 DIAGNOSIS — E46 Unspecified protein-calorie malnutrition: Secondary | ICD-10-CM | POA: Diagnosis not present

## 2017-09-08 DIAGNOSIS — K603 Anal fistula, unspecified: Secondary | ICD-10-CM | POA: Diagnosis present

## 2017-09-08 DIAGNOSIS — D849 Immunodeficiency, unspecified: Secondary | ICD-10-CM | POA: Diagnosis present

## 2017-09-08 DIAGNOSIS — J982 Interstitial emphysema: Secondary | ICD-10-CM | POA: Diagnosis not present

## 2017-09-08 DIAGNOSIS — Z9049 Acquired absence of other specified parts of digestive tract: Secondary | ICD-10-CM

## 2017-09-08 DIAGNOSIS — R579 Shock, unspecified: Secondary | ICD-10-CM | POA: Diagnosis not present

## 2017-09-08 DIAGNOSIS — K219 Gastro-esophageal reflux disease without esophagitis: Secondary | ICD-10-CM | POA: Diagnosis present

## 2017-09-08 HISTORY — PX: LAPAROSCOPIC LYSIS OF ADHESIONS: SHX5905

## 2017-09-08 HISTORY — DX: Cutaneous abscess of buttock: L02.31

## 2017-09-08 HISTORY — DX: Unspecified intestinal obstruction, unspecified as to partial versus complete obstruction: K56.609

## 2017-09-08 HISTORY — PX: HIATAL HERNIA REPAIR: SHX195

## 2017-09-08 SURGERY — REPAIR, HERNIA, HIATAL, LAPAROSCOPIC
Anesthesia: General | Site: Abdomen

## 2017-09-08 MED ORDER — DEXAMETHASONE SODIUM PHOSPHATE 4 MG/ML IJ SOLN
INTRAMUSCULAR | Status: DC | PRN
  Administered 2017-09-08: 10 mg via INTRAVENOUS

## 2017-09-08 MED ORDER — FENTANYL CITRATE (PF) 100 MCG/2ML IJ SOLN
INTRAMUSCULAR | Status: AC
  Filled 2017-09-08: qty 2

## 2017-09-08 MED ORDER — EPHEDRINE 5 MG/ML INJ
INTRAVENOUS | Status: AC
  Filled 2017-09-08: qty 10

## 2017-09-08 MED ORDER — PANTOPRAZOLE SODIUM 40 MG PO TBEC
40.0000 mg | DELAYED_RELEASE_TABLET | Freq: Every day | ORAL | Status: DC
  Administered 2017-09-08 – 2017-09-09 (×2): 40 mg via ORAL
  Filled 2017-09-08 (×2): qty 1

## 2017-09-08 MED ORDER — FUROSEMIDE 20 MG PO TABS: 20.0000 mg | ORAL_TABLET | Freq: Every day | ORAL | Status: DC | PRN

## 2017-09-08 MED ORDER — LIDOCAINE 2% (20 MG/ML) 5 ML SYRINGE
INTRAMUSCULAR | Status: DC | PRN
  Administered 2017-09-08: 60 mg via INTRAVENOUS

## 2017-09-08 MED ORDER — LACTATED RINGERS IV SOLN
INTRAVENOUS | Status: DC
  Administered 2017-09-08 (×4): via INTRAVENOUS

## 2017-09-08 MED ORDER — OXYCODONE HCL 5 MG PO TABS
5.0000 mg | ORAL_TABLET | ORAL | Status: DC | PRN
  Administered 2017-09-08 – 2017-09-09 (×2): 10 mg via ORAL
  Filled 2017-09-08 (×2): qty 1
  Filled 2017-09-08: qty 2

## 2017-09-08 MED ORDER — HYDROMORPHONE HCL 1 MG/ML IJ SOLN
1.0000 mg | INTRAMUSCULAR | Status: DC | PRN
  Administered 2017-09-08 – 2017-09-09 (×2): 2 mg via INTRAVENOUS
  Filled 2017-09-08 (×2): qty 2

## 2017-09-08 MED ORDER — SODIUM CHLORIDE 0.9 % IR SOLN
Status: DC | PRN
  Administered 2017-09-08: 20 mL
  Administered 2017-09-08: 1000 mL

## 2017-09-08 MED ORDER — CHLORHEXIDINE GLUCONATE CLOTH 2 % EX PADS: 6.0000 | MEDICATED_PAD | Freq: Once | CUTANEOUS | Status: DC

## 2017-09-08 MED ORDER — HYDROMORPHONE HCL-NACL 0.5-0.9 MG/ML-% IV SOSY
PREFILLED_SYRINGE | INTRAVENOUS | Status: AC
  Filled 2017-09-08: qty 1

## 2017-09-08 MED ORDER — BUPIVACAINE-EPINEPHRINE 0.25% -1:200000 IJ SOLN
INTRAMUSCULAR | Status: DC | PRN
  Administered 2017-09-08: 15 mL

## 2017-09-08 MED ORDER — FENTANYL CITRATE (PF) 100 MCG/2ML IJ SOLN
INTRAMUSCULAR | Status: DC | PRN
  Administered 2017-09-08: 50 ug via INTRAVENOUS
  Administered 2017-09-08: 100 ug via INTRAVENOUS
  Administered 2017-09-08 (×6): 50 ug via INTRAVENOUS

## 2017-09-08 MED ORDER — PROPOFOL 10 MG/ML IV BOLUS
INTRAVENOUS | Status: DC | PRN
  Administered 2017-09-08: 100 mg via INTRAVENOUS

## 2017-09-08 MED ORDER — PHENYLEPHRINE 40 MCG/ML (10ML) SYRINGE FOR IV PUSH (FOR BLOOD PRESSURE SUPPORT)
PREFILLED_SYRINGE | INTRAVENOUS | Status: DC | PRN
  Administered 2017-09-08 (×3): 80 ug via INTRAVENOUS

## 2017-09-08 MED ORDER — PROMETHAZINE HCL 25 MG/ML IJ SOLN
12.5000 mg | Freq: Four times a day (QID) | INTRAMUSCULAR | Status: DC | PRN
Start: 2017-09-08 — End: 2017-09-26
  Administered 2017-09-17 – 2017-09-24 (×6): 12.5 mg via INTRAVENOUS
  Filled 2017-09-08 (×7): qty 1

## 2017-09-08 MED ORDER — ROCURONIUM BROMIDE 50 MG/5ML IV SOSY
PREFILLED_SYRINGE | INTRAVENOUS | Status: AC
  Filled 2017-09-08: qty 5

## 2017-09-08 MED ORDER — LIDOCAINE 2% (20 MG/ML) 5 ML SYRINGE
INTRAMUSCULAR | Status: AC
  Filled 2017-09-08: qty 5

## 2017-09-08 MED ORDER — ENOXAPARIN SODIUM 40 MG/0.4ML ~~LOC~~ SOLN
40.0000 mg | SUBCUTANEOUS | Status: DC
  Administered 2017-09-09: 40 mg via SUBCUTANEOUS
  Filled 2017-09-08: qty 0.4

## 2017-09-08 MED ORDER — BUPIVACAINE-EPINEPHRINE 0.5% -1:200000 IJ SOLN
INTRAMUSCULAR | Status: AC
  Filled 2017-09-08: qty 1

## 2017-09-08 MED ORDER — ONDANSETRON HCL 4 MG/2ML IJ SOLN
INTRAMUSCULAR | Status: AC
  Filled 2017-09-08: qty 2

## 2017-09-08 MED ORDER — VANCOMYCIN HCL IN DEXTROSE 1-5 GM/200ML-% IV SOLN
1000.0000 mg | Freq: Once | INTRAVENOUS | Status: AC
  Administered 2017-09-08 (×2): 1000 mg via INTRAVENOUS

## 2017-09-08 MED ORDER — EPHEDRINE SULFATE-NACL 50-0.9 MG/10ML-% IV SOSY
PREFILLED_SYRINGE | INTRAVENOUS | Status: DC | PRN
  Administered 2017-09-08: 5 mg via INTRAVENOUS

## 2017-09-08 MED ORDER — PHENYLEPHRINE 40 MCG/ML (10ML) SYRINGE FOR IV PUSH (FOR BLOOD PRESSURE SUPPORT)
PREFILLED_SYRINGE | INTRAVENOUS | Status: AC
  Filled 2017-09-08: qty 10

## 2017-09-08 MED ORDER — SUGAMMADEX SODIUM 200 MG/2ML IV SOLN
INTRAVENOUS | Status: DC | PRN
  Administered 2017-09-08: 200 mg via INTRAVENOUS

## 2017-09-08 MED ORDER — DEXAMETHASONE SODIUM PHOSPHATE 10 MG/ML IJ SOLN
INTRAMUSCULAR | Status: AC
  Filled 2017-09-08: qty 1

## 2017-09-08 MED ORDER — VANCOMYCIN HCL IN DEXTROSE 1-5 GM/200ML-% IV SOLN
INTRAVENOUS | Status: AC
  Filled 2017-09-08: qty 200

## 2017-09-08 MED ORDER — SIMETHICONE 80 MG PO CHEW: 40.0000 mg | CHEWABLE_TABLET | Freq: Four times a day (QID) | ORAL | Status: DC | PRN

## 2017-09-08 MED ORDER — LIDOCAINE HCL 2 % IJ SOLN
INTRAMUSCULAR | Status: AC
  Filled 2017-09-08: qty 20

## 2017-09-08 MED ORDER — DICYCLOMINE HCL 10 MG/5ML PO SOLN
20.0000 mg | Freq: Three times a day (TID) | ORAL | Status: DC | PRN
  Filled 2017-09-08: qty 10

## 2017-09-08 MED ORDER — ACETAMINOPHEN 500 MG PO TABS
1000.0000 mg | ORAL_TABLET | ORAL | Status: AC
  Administered 2017-09-08: 1000 mg via ORAL
  Filled 2017-09-08: qty 2

## 2017-09-08 MED ORDER — PROPOFOL 10 MG/ML IV BOLUS
INTRAVENOUS | Status: AC
  Filled 2017-09-08: qty 40

## 2017-09-08 MED ORDER — ALBUTEROL SULFATE (2.5 MG/3ML) 0.083% IN NEBU
2.5000 mg | INHALATION_SOLUTION | Freq: Four times a day (QID) | RESPIRATORY_TRACT | Status: DC | PRN
  Administered 2017-09-18: 2.5 mg via RESPIRATORY_TRACT
  Filled 2017-09-08: qty 3

## 2017-09-08 MED ORDER — DEXTROSE-NACL 5-0.9 % IV SOLN
INTRAVENOUS | Status: DC
  Administered 2017-09-08: 17:00:00 via INTRAVENOUS

## 2017-09-08 MED ORDER — SUGAMMADEX SODIUM 200 MG/2ML IV SOLN
INTRAVENOUS | Status: AC
  Filled 2017-09-08: qty 2

## 2017-09-08 MED ORDER — CELECOXIB 200 MG PO CAPS
200.0000 mg | ORAL_CAPSULE | ORAL | Status: AC
  Administered 2017-09-08: 200 mg via ORAL
  Filled 2017-09-08: qty 1

## 2017-09-08 MED ORDER — HYDRALAZINE HCL 20 MG/ML IJ SOLN: 10.0000 mg | INTRAMUSCULAR | Status: DC | PRN

## 2017-09-08 MED ORDER — GABAPENTIN 300 MG PO CAPS
300.0000 mg | ORAL_CAPSULE | ORAL | Status: AC
  Administered 2017-09-08: 300 mg via ORAL
  Filled 2017-09-08: qty 1

## 2017-09-08 MED ORDER — PROMETHAZINE HCL 25 MG/ML IJ SOLN: 6.2500 mg | INTRAMUSCULAR | Status: DC | PRN

## 2017-09-08 MED ORDER — ONDANSETRON 4 MG PO TBDP: 4.0000 mg | ORAL_TABLET | Freq: Four times a day (QID) | ORAL | Status: DC | PRN

## 2017-09-08 MED ORDER — FENTANYL CITRATE (PF) 250 MCG/5ML IJ SOLN
INTRAMUSCULAR | Status: AC
  Filled 2017-09-08: qty 5

## 2017-09-08 MED ORDER — ROCURONIUM BROMIDE 10 MG/ML (PF) SYRINGE
PREFILLED_SYRINGE | INTRAVENOUS | Status: DC | PRN
  Administered 2017-09-08: 30 mg via INTRAVENOUS

## 2017-09-08 MED ORDER — LACTATED RINGERS IV SOLN
INTRAVENOUS | Status: AC | PRN
  Administered 2017-09-08: 1000 mL

## 2017-09-08 MED ORDER — CEFAZOLIN SODIUM-DEXTROSE 2-4 GM/100ML-% IV SOLN
INTRAVENOUS | Status: AC
  Filled 2017-09-08: qty 100

## 2017-09-08 MED ORDER — LIDOCAINE 2% (20 MG/ML) 5 ML SYRINGE
INTRAMUSCULAR | Status: DC | PRN
  Administered 2017-09-08: 1.5 mg/kg/h via INTRAVENOUS

## 2017-09-08 MED ORDER — SPIRONOLACTONE 25 MG PO TABS
50.0000 mg | ORAL_TABLET | Freq: Every day | ORAL | Status: DC
  Administered 2017-09-08: 50 mg via ORAL
  Filled 2017-09-08: qty 2

## 2017-09-08 MED ORDER — FENTANYL CITRATE (PF) 100 MCG/2ML IJ SOLN
25.0000 ug | INTRAMUSCULAR | Status: DC | PRN
  Administered 2017-09-08: 25 ug via INTRAVENOUS
  Administered 2017-09-08: 50 ug via INTRAVENOUS
  Administered 2017-09-08 (×3): 25 ug via INTRAVENOUS

## 2017-09-08 MED ORDER — ONDANSETRON HCL 4 MG/2ML IJ SOLN
4.0000 mg | Freq: Four times a day (QID) | INTRAMUSCULAR | Status: DC | PRN
  Administered 2017-09-08 – 2017-09-20 (×8): 4 mg via INTRAVENOUS
  Filled 2017-09-08 (×9): qty 2

## 2017-09-08 MED ORDER — CEFAZOLIN SODIUM-DEXTROSE 2-4 GM/100ML-% IV SOLN
2.0000 g | INTRAVENOUS | Status: AC
  Administered 2017-09-08: 2 g via INTRAVENOUS
  Filled 2017-09-08: qty 100

## 2017-09-08 MED ORDER — HYDROCODONE-ACETAMINOPHEN 7.5-325 MG PO TABS
1.0000 | ORAL_TABLET | Freq: Two times a day (BID) | ORAL | Status: DC | PRN
  Administered 2017-09-08 – 2017-09-09 (×2): 1 via ORAL
  Filled 2017-09-08 (×3): qty 1

## 2017-09-08 SURGICAL SUPPLY — 50 items
ADH SKN CLS APL DERMABOND .7 (GAUZE/BANDAGES/DRESSINGS) ×2
APPLIER CLIP ROT 10 11.4 M/L (STAPLE)
APR CLP MED LRG 11.4X10 (STAPLE)
BINDER ABDOMINAL 12 ML 46-62 (SOFTGOODS) ×2 IMPLANT
BNDG GAUZE ELAST 4 BULKY (GAUZE/BANDAGES/DRESSINGS) ×2 IMPLANT
CHLORAPREP W/TINT 26ML (MISCELLANEOUS) ×4 IMPLANT
CLIP APPLIE ROT 10 11.4 M/L (STAPLE) IMPLANT
DECANTER SPIKE VIAL GLASS SM (MISCELLANEOUS) ×4 IMPLANT
DERMABOND ADVANCED (GAUZE/BANDAGES/DRESSINGS) ×2
DERMABOND ADVANCED .7 DNX12 (GAUZE/BANDAGES/DRESSINGS) IMPLANT
DRAIN PENROSE 18X1/4 LTX STRL (WOUND CARE) ×4 IMPLANT
DRAPE UTILITY XL STRL (DRAPES) ×4 IMPLANT
DRSG PAD ABDOMINAL 8X10 ST (GAUZE/BANDAGES/DRESSINGS) ×2 IMPLANT
ELECT REM PT RETURN 15FT ADLT (MISCELLANEOUS) ×4 IMPLANT
GLOVE BIO SURGEON STRL SZ7.5 (GLOVE) ×4 IMPLANT
GOWN STRL REUS W/TWL XL LVL3 (GOWN DISPOSABLE) ×16 IMPLANT
KIT BASIN OR (CUSTOM PROCEDURE TRAY) ×4 IMPLANT
LEGGING LITHOTOMY PAIR STRL (DRAPES) ×4 IMPLANT
NDL INSUFFLATION 14GA 120MM (NEEDLE) ×2 IMPLANT
NEEDLE INSUFFLATION 14GA 120MM (NEEDLE) ×4 IMPLANT
PAD POSITIONING PINK XL (MISCELLANEOUS) IMPLANT
POSITIONER SURGICAL ARM (MISCELLANEOUS) IMPLANT
RELOAD STAPLE 4.0 BLU F/HERNIA (INSTRUMENTS) IMPLANT
RELOAD STAPLE 4.8 BLK F/HERNIA (STAPLE) IMPLANT
RELOAD STAPLE HERNIA 4.0 BLUE (INSTRUMENTS) IMPLANT
RELOAD STAPLE HERNIA 4.8 BLK (STAPLE) IMPLANT
SET IRRIG TUBING LAPAROSCOPIC (IRRIGATION / IRRIGATOR) ×4 IMPLANT
SHEARS HARMONIC ACE PLUS 36CM (ENDOMECHANICALS) IMPLANT
SLEEVE XCEL OPT CAN 5 100 (ENDOMECHANICALS) ×14 IMPLANT
SOLUTION ANTI FOG 6CC (MISCELLANEOUS) ×4 IMPLANT
SPONGE DRAIN TRACH 4X4 STRL 2S (GAUZE/BANDAGES/DRESSINGS) ×2 IMPLANT
STAPLER HERNIA 12 8.5 360D (INSTRUMENTS) ×2 IMPLANT
STAPLER VISISTAT 35W (STAPLE) IMPLANT
SUT ETHIBOND 2 0 SH (SUTURE) ×8
SUT ETHIBOND 2 0 SH 36X2 (SUTURE) ×6 IMPLANT
SUT MNCRL AB 4-0 PS2 18 (SUTURE) ×4 IMPLANT
SUT SILK 0 CT 1 30 (SUTURE) ×6 IMPLANT
TAPE CLOTH 4X10 WHT NS (GAUZE/BANDAGES/DRESSINGS) IMPLANT
TIP INNERVISION DETACH 40FR (MISCELLANEOUS) IMPLANT
TIP INNERVISION DETACH 50FR (MISCELLANEOUS) IMPLANT
TIP INNERVISION DETACH 56FR (MISCELLANEOUS) IMPLANT
TIPS INNERVISION DETACH 40FR (MISCELLANEOUS)
TOWEL OR 17X26 10 PK STRL BLUE (TOWEL DISPOSABLE) ×4 IMPLANT
TOWEL OR NON WOVEN STRL DISP B (DISPOSABLE) ×4 IMPLANT
TRAY FOLEY W/METER SILVER 16FR (SET/KITS/TRAYS/PACK) ×4 IMPLANT
TRAY LAPAROSCOPIC (CUSTOM PROCEDURE TRAY) ×4 IMPLANT
TROCAR BLADELESS OPT 5 100 (ENDOMECHANICALS) ×6 IMPLANT
TROCAR XCEL NON-BLD 11X100MML (ENDOMECHANICALS) ×2 IMPLANT
TUBE MOSS GAS 18FR (TUBING) ×2 IMPLANT
TUBING INSUF HEATED (TUBING) ×4 IMPLANT

## 2017-09-08 NOTE — H&P (View-Only) (Signed)
History of Present Illness Ralene Ok MD; 08/12/2017 11:57 AM) The patient is a 71 year old female who presents with a hiatal hernia. Patient is a 71 year old female who is recently seen secondary to her hiatal hernia. Since the last clinic visit the patient was admitted to the hospital for small bowel structure. This appeared to resolve spontaneously. Patient states that her TPN has been canceled. She's been eating normally. She's had no dysphasia. She continues with breeze supplemental drinks.  Patient's most recent prealbumin was 14.9, her recent albumin was 2.8. Patient's appetite is appeared improved since this time. Patient does take Remicade every 6 weeks.   Allergies Malachy Moan, [place]; 08/12/2017 11:34 AM) Azucena Fallen Tri-Pak *MACROLIDES*  Aspirin *ANALGESICS - NonNarcotic*  Ciprofloxacin *CHEMICALS*  Sulfa 10 *OPHTHALMIC AGENTS*  Allergies Reconciled   Medication History Malachy Moan, RMA; 08/12/2017 11:34 AM) Protonix (Oral) Specific strength unknown - Active. Hydrocodone-Acetaminophen (7.5-325MG  Tablet, Oral) Active. Potassium Chloride (20 MEQ/15ML(10%) Solution, Oral) Active. Dicyclomine HCl (10MG /5ML Solution, Oral) Active. Zolpidem Tartrate (10MG  Tablet, Oral) Active. Diphenoxylate-Atropine (2.5-0.025MG  Tablet, Oral) Active. Clobetasol Propionate (0.05% Ointment, External) Active. Cyanocobalamin (1000MCG/ML Solution, Injection) Active. Desonide (0.05% Ointment, External) Active. Fluconazole (150MG  Tablet, Oral) Active. Fluconazole (100MG  Tablet, Oral) Active. Fluocinonide (0.05% Ointment, External) Active. Furosemide (20MG  Tablet, Oral) Active. Magnesium Oxide (400 (241.3 Mg)MG Tablet, Oral) Active. Klor-Con M20 [facility] Tablet ER, Oral) Active. MetroNIDAZOLE (250MG  Tablet, Oral) Active. Nystatin (100000 UNIT/GM Cream, External) Active. Nystop (100000 UNIT/GM Powder, External) Active. Ondansetron HCl (4MG  Tablet, Oral)  Active. Pantoprazole Sodium (40MG  Tablet DR, Oral) Active. Phospha 250 Neutral (155-852-130MG  Tablet, Oral) Active. Promethazine HCl (12.5MG  Tablet, Oral) Active. Sodium Bicarbonate (650MG  Tablet, Oral) Active. Triamcinolone Acetonide (0.1% Cream, External) Active. Medications Reconciled    Review of Systems Ralene Ok, MD; 08/12/2017 11:54 AM) General Not Present- Appetite Loss, Chills, Fatigue, Fever, Night Sweats, Weight Gain and Weight Loss. Skin Not Present- Change in Wart/Mole, Dryness, Hives, Jaundice, New Lesions, Non-Healing Wounds, Rash and Ulcer. HEENT Present- Wears glasses/contact lenses. Not Present- Earache, Hearing Loss, Hoarseness, Nose Bleed, Oral Ulcers, Ringing in the Ears, Seasonal Allergies, Sinus Pain, Sore Throat, Visual Disturbances and Yellow Eyes. Breast Not Present- Breast Mass, Breast Pain, Nipple Discharge and Skin Changes. Cardiovascular Not Present- Chest Pain, Difficulty Breathing Lying Down, Leg Cramps, Palpitations, Rapid Heart Rate, Shortness of Breath and Swelling of Extremities. Gastrointestinal Not Present- Abdominal Pain, Bloating, Bloody Stool, Change in Bowel Habits, Chronic diarrhea, Constipation, Difficulty Swallowing, Excessive gas, Gets full quickly at meals, Hemorrhoids, Indigestion, Nausea, Rectal Pain and Vomiting. Musculoskeletal Not Present- Back Pain, Joint Pain, Joint Stiffness, Muscle Pain, Muscle Weakness and Swelling of Extremities. Neurological Not Present- Decreased Memory, Fainting, Headaches, Numbness, Seizures, Tingling, Tremor, Trouble walking and Weakness. Psychiatric Not Present- Anxiety, Bipolar, Change in Sleep Pattern, Depression, Fearful and Frequent crying. Endocrine Not Present- Cold Intolerance, Excessive Hunger, Hair Changes, Heat Intolerance and New Diabetes. Hematology Not Present- Blood Thinners, Easy Bruising, Excessive bleeding, Gland problems, HIV and Persistent Infections. All other systems  negative  Vitals Malachy Moan RMA; 08/12/2017 11:34 AM) 08/12/2017 11:34 AM Weight: 125.2 lb Height: 64in Body Surface Area: 1.6 m Body Mass Index: 21.49 kg/m  Temp.: 98.71F  Pulse: 79 (Regular)  BP: 100/60 (Sitting, Left Arm, Standard)       Physical Exam Ralene Ok, MD; 08/12/2017 11:54 AM) The physical exam findings are as follows: Note: Constitutional: No acute distress, conversant, appears stated age  Eyes: Anicteric sclerae, moist conjunctiva, no lid lag  Neck: No thyromegaly, trachea midline, no cervical lymphadenopathy  Lungs: Clear to auscultation  biilaterally, normal respiratory effot  Cardiovascular: regular rate & rhythm, no murmurs, no peripheal edema, pedal pulses 2+  GI: Soft, no masses or hepatosplenomegaly, non-tender to palpation. Large midline incision  MSK: Normal gait, no clubbing cyanosis, edema  Skin: No rashes, palpation reveals normal skin turgor  Psychiatric: Appropriate judgment and insight, oriented to person, place, and time    Assessment & Plan Ralene Ok MD; 08/12/2017 11:59 AM) HIATAL HERNIA (K44.9) Impression: 71 year old female with a past medical history significant for Crohn's and hiatal hernia. 1. Will proceed to the operating room for laparoscopic lysis of adhesions, laparoscopic hiatal hernia repair and Nissen versus Toupet fundoplication. 2. Discussion of the risks and benefits of the procedure to include but not limited to: Infection, bleeding, damage to structures, possible pneumothorax, possible esophageal injury. Patient voiced understanding and wishes to proceed.

## 2017-09-08 NOTE — Anesthesia Procedure Notes (Signed)
Procedure Name: Intubation Date/Time: 09/08/2017 10:17 AM Performed by: Claudia Desanctis Pre-anesthesia Checklist: Patient identified, Emergency Drugs available, Suction available and Patient being monitored Patient Re-evaluated:Patient Re-evaluated prior to induction Oxygen Delivery Method: Circle system utilized Preoxygenation: Pre-oxygenation with 100% oxygen Induction Type: IV induction Ventilation: Mask ventilation without difficulty Laryngoscope Size: 2 and Miller Grade View: Grade I Tube type: Oral Tube size: 7.0 mm Number of attempts: 1 Airway Equipment and Method: Stylet Placement Confirmation: ETT inserted through vocal cords under direct vision,  positive ETCO2 and breath sounds checked- equal and bilateral Secured at: 21 cm Tube secured with: Tape Dental Injury: Teeth and Oropharynx as per pre-operative assessment

## 2017-09-08 NOTE — Anesthesia Postprocedure Evaluation (Signed)
Anesthesia Post Note  Patient: Danielle [name]  Procedure(s) Performed: DIAGNOSTIC LAPAROSCOPIC PLACEMENT OF gastrostomy TUBE AND GASTROPEXY (N/A Abdomen) LAPAROSCOPIC LYSIS OF ADHESIONS (N/A )     Patient location during evaluation: PACU Anesthesia Type: General Level of consciousness: awake and alert Pain management: pain level controlled Vital Signs Assessment: post-procedure vital signs reviewed and stable Respiratory status: spontaneous breathing, nonlabored ventilation, respiratory function stable and patient connected to nasal cannula oxygen Cardiovascular status: blood pressure returned to baseline and stable Postop Assessment: no apparent nausea or vomiting Anesthetic complications: no    Last Vitals:  Vitals:   09/08/17 1500 09/08/17 1530  BP: 120/89 123/80  Pulse: 100 98  Resp: 17 18  Temp: 36.7 C 36.7 C  SpO2: 99% 100%    Last Pain:  Vitals:   09/08/17 1541  TempSrc:   PainSc: 8                  Tiajuana Amass

## 2017-09-08 NOTE — Anesthesia Preprocedure Evaluation (Addendum)
Anesthesia Evaluation  Patient identified by MRN, date of birth, ID band Patient awake    Reviewed: Allergy & Precautions, NPO status , Patient's Chart, lab work & pertinent test results  Airway Mallampati: II  TM Distance: >3 FB     Dental  (+) Dental Advisory Given, Lower Dentures, Upper Dentures   Pulmonary former smoker,    breath sounds clear to auscultation       Cardiovascular negative cardio ROS   Rhythm:Regular Rate:Normal  04/2017: Extremely limited due to poor sound wave transmission; LV  function appears to be preserved; focal wall motion abnormality  cannot be excluded; mild diastolic dysfunction; right heart not  well visualized but appears to be normal in size with normal RV  function.   Neuro/Psych negative neurological ROS     GI/Hepatic Neg liver ROS, hiatal hernia, GERD  ,  Endo/Other  negative endocrine ROS  Renal/GU CRFRenal disease     Musculoskeletal   Abdominal   Peds  Hematology  (+) anemia ,   Anesthesia Other Findings   Reproductive/Obstetrics                            Anesthesia Physical Anesthesia Plan  ASA: III  Anesthesia Plan: General   Post-op Pain Management:    Induction: Intravenous  PONV Risk Score and Plan: 4 or greater and Ondansetron, Dexamethasone and Treatment may vary due to age or medical condition  Airway Management Planned: Oral ETT  Additional Equipment:   Intra-op Plan:   Post-operative Plan: Extubation in OR  Informed Consent: I have reviewed the patients History and Physical, chart, labs and discussed the procedure including the risks, benefits and alternatives for the proposed anesthesia with the patient or authorized representative who has indicated his/her understanding and acceptance.   Dental advisory given  Plan Discussed with: CRNA  Anesthesia Plan Comments:        Anesthesia Quick Evaluation

## 2017-09-08 NOTE — Op Note (Signed)
09/08/2017  97:02 PM  PATIENT:  Madelynn Done Vickroy  71 y.o. female  PRE-OPERATIVE DIAGNOSIS:  Type 4 paraesophageal hiatal hernia   POST-OPERATIVE DIAGNOSIS:  same  PROCEDURE:  Procedure(s): DIAGNOSTIC LAPAROSCOPIC PLACEMENT OF G-TUBE AND GASTROPEXY (N/A) LAPAROSCOPIC LYSIS OF ADHESIONS45 minutes (N/A)  SURGEON:  Surgeon(s) and Role:    Ralene Ok, MD - Primary   ASSISTANTS: Dr. Romana Juniper   ANESTHESIA:   local and general  EBL:15cc   Total I/O In: 1000 [I.V.:1000] Out: -   BLOOD ADMINISTERED:none  DRAINS: none   LOCAL MEDICATIONS USED:  BUPIVICAINE   SPECIMEN:  No Specimen  DISPOSITION OF SPECIMEN:  N/A  COUNTS:  YES  TOURNIQUET:  * No tourniquets in log *  DICTATION: .Dragon Dictation Indication for procedure: Patient is a 71 year old femalewith multiple month history of dysphagia with a large type IV hiatal hernia. Patient was seen in clinic and after multiple consultations and nutrition preparation the patient elected for hiatal hernia repair and fundoplication.  Details of procedure: After the patient was consented she was taken back to the operating room placed in supine position with bilateral SCDs in place. Patient's prepped and draped in standard fashion.  After appropriate antibiotics were confirmed a timeout was called and all facts verified.  At this time a Veress needle technique was used to insufflate the abdomen the right subcostal margin. Subsequent to this a 5 mm trocar and camera then placed intra-abdominally. There was no injury to any intra-abdominal organs. There was what appeared to be a large amount of omental/small bowel adhesions to the anterior abdominal wall in the midline and the left lower quadrant. A subsequent 5 mm trochars placed in the suprapubic region under direct visualization. At this time the adhesions of the omentum to the midline with an taken down sharply. Small bowel adhesions were taken down sharply. A third 5 mm  trocar was then placed in the left lower quadrant direct visualization. This helped take down the lower midline as well as left lower quadrant adhesions. Lysis of adhesions was approximately 45 minutes.  Once the adhesions were taken down. Subsequent 5 mm trocar and nasal surgery retractor were placed in the epigastrium under direct visualization. The patient was positioned.The left lobe of the liver was then retracted anteriorly. A 12 mm trocar was placed in the left upper quadrant direct visualization. At this time the large hiatal hernia could be easily visualized. The stomach was retracted down to the abdominal cavity however wasvery resistant to be reduced. At this time I proceeded from the gastrohepatic ligament and incising this. I proceeded to incise the anterior hernia sac . That this would break dissection of the stomach into the chest. To help with reducing the left-sided hernia sac extending to incise the gastrocolic ligament. This helped reduce the omentum off of the greater curvature of the stomach. I took this superiorly. The short gastrics were ligated using the Harmonic scalpel. At this time this allowed some retraction of the stomach into the abdominal cavity. I was able to incise the hernia sac that was lain within the chest cavity. The pros easily visualized and protected. Upon removing and dissecting away the anterior hernia sac the stomach still was very resistant to being reduced into the abdominal cavity. The esophagus was not able to be visualized easily. However was likely seen in the left portion of the chest wall. Secondary to the fact that the anatomy, short esophagus, and the restriction of the stomach to be resistant the abdominal  cavity at the site at this time to proceed with gastrostomy tube placement and gastropexy.  The stomach was grasped and brought into the abdominal cavity as far as it could. An area was chosen on the greater curvature of the stomach as well as anterior  abdominal wall off of the left subcostal margin. An incision is made on the skin. At this time 0 silks were used to pexy the stomach to the abdominal wall using the help of a Endo Close device. At this time a hook cautery was used to make a gastrotomy. Tonsils were used to widen the gastrotomy site. A 24 French Moss gastrostomy tube was in place within the stomach. The balloon was inflated. The stomach was brought up to the anterior abdominal wall. The sutures were then tied down. A portion of the stomach was chosen distal to the tube placement for the gastropexy site. The stomach lay straight in line with its axis without any twisting.  At this time the 12 mm trocar was removed.The fascia at the site was reapproximated using an 0 Vicryl and an Endo Close device. Insufflation was evacuated. All trochars removed. The skin of all trocar sites reapproximated using 4-0 Monocryl.  An area of the previous midline incision or a probable stitch was palpated was incised. A Prolene suture not. Tails were removed.  Dermabond was placed at all incision sites. The patient tolerated the procedure well was taken to the recovery room in stable condition.  PLAN OF CARE: Admit for overnight observation  PATIENT DISPOSITION:  PACU - hemodynamically stable.   Delay start of Pharmacological VTE agent (>24hrs) due to surgical blood loss or risk of bleeding: not applicable

## 2017-09-08 NOTE — Interval H&P Note (Signed)
History and Physical Interval Note:  15/05/1536 9:43 AM  Danielle [name]  has presented today for surgery, with the diagnosis of HIATAL HERNIA  The various methods of treatment have been discussed with the patient and family. After consideration of risks, benefits and other options for treatment, the patient has consented to  Procedure(s): LAPAROSCOPIC REPAIR OF HIATAL HERNIA WITH FUNDOPLICATION (N/A) LAPAROSCOPIC LYSIS OF ADHESIONS (N/A) INSERTION OF MESH (N/A) and possible Gastropexy and gastric tube placement as a surgical intervention .  The patient's history has been reviewed, patient examined, no change in status, stable for surgery.  I have reviewed the patient's chart and labs.  Questions were answered to the patient's satisfaction.     [name]., Anne Hahn

## 2017-09-08 NOTE — Transfer of Care (Signed)
Immediate Anesthesia Transfer of Care Note  Patient: Danielle [name]  Procedure(s) Performed: Procedure(s): DIAGNOSTIC LAPAROSCOPIC PLACEMENT OF gastrostomy TUBE AND GASTROPEXY (N/A) LAPAROSCOPIC LYSIS OF ADHESIONS (N/A)  Patient Location: PACU  Anesthesia Type:General  Level of Consciousness:  sedated, patient cooperative and responds to stimulation  Airway & Oxygen Therapy:Patient Spontanous Breathing and Patient connected to face mask oxgen  Post-op Assessment:  Report given to PACU RN and Post -op Vital signs reviewed and stable  Post vital signs:  Reviewed and stable  Last Vitals:  Vitals:   09/08/17 0816  BP: 103/65  Pulse: 84  Resp: 16  Temp: 36.8 C  SpO2: 491%    Complications: No apparent anesthesia complications

## 2017-09-09 ENCOUNTER — Observation Stay (HOSPITAL_COMMUNITY): Payer: Medicare HMO

## 2017-09-09 ENCOUNTER — Inpatient Hospital Stay (HOSPITAL_COMMUNITY): Payer: Medicare HMO

## 2017-09-09 DIAGNOSIS — Z452 Encounter for adjustment and management of vascular access device: Secondary | ICD-10-CM | POA: Diagnosis not present

## 2017-09-09 DIAGNOSIS — L02211 Cutaneous abscess of abdominal wall: Secondary | ICD-10-CM | POA: Diagnosis not present

## 2017-09-09 DIAGNOSIS — N170 Acute kidney failure with tubular necrosis: Secondary | ICD-10-CM | POA: Diagnosis not present

## 2017-09-09 DIAGNOSIS — J90 Pleural effusion, not elsewhere classified: Secondary | ICD-10-CM | POA: Diagnosis present

## 2017-09-09 DIAGNOSIS — J690 Pneumonitis due to inhalation of food and vomit: Secondary | ICD-10-CM | POA: Diagnosis not present

## 2017-09-09 DIAGNOSIS — A419 Sepsis, unspecified organism: Secondary | ICD-10-CM | POA: Diagnosis not present

## 2017-09-09 DIAGNOSIS — R0602 Shortness of breath: Secondary | ICD-10-CM | POA: Diagnosis not present

## 2017-09-09 DIAGNOSIS — K50113 Crohn's disease of large intestine with fistula: Secondary | ICD-10-CM | POA: Diagnosis present

## 2017-09-09 DIAGNOSIS — N171 Acute kidney failure with acute cortical necrosis: Secondary | ICD-10-CM | POA: Diagnosis not present

## 2017-09-09 DIAGNOSIS — B49 Unspecified mycosis: Secondary | ICD-10-CM | POA: Diagnosis not present

## 2017-09-09 DIAGNOSIS — K668 Other specified disorders of peritoneum: Secondary | ICD-10-CM | POA: Diagnosis present

## 2017-09-09 DIAGNOSIS — D696 Thrombocytopenia, unspecified: Secondary | ICD-10-CM | POA: Diagnosis not present

## 2017-09-09 DIAGNOSIS — J9601 Acute respiratory failure with hypoxia: Secondary | ICD-10-CM | POA: Diagnosis not present

## 2017-09-09 DIAGNOSIS — E878 Other disorders of electrolyte and fluid balance, not elsewhere classified: Secondary | ICD-10-CM | POA: Diagnosis not present

## 2017-09-09 DIAGNOSIS — E870 Hyperosmolality and hypernatremia: Secondary | ICD-10-CM | POA: Diagnosis not present

## 2017-09-09 DIAGNOSIS — J95821 Acute postprocedural respiratory failure: Secondary | ICD-10-CM | POA: Diagnosis not present

## 2017-09-09 DIAGNOSIS — K9423 Gastrostomy malfunction: Secondary | ICD-10-CM | POA: Diagnosis not present

## 2017-09-09 DIAGNOSIS — R6521 Severe sepsis with septic shock: Secondary | ICD-10-CM | POA: Diagnosis not present

## 2017-09-09 DIAGNOSIS — K651 Peritoneal abscess: Secondary | ICD-10-CM | POA: Diagnosis not present

## 2017-09-09 DIAGNOSIS — G9341 Metabolic encephalopathy: Secondary | ICD-10-CM | POA: Diagnosis not present

## 2017-09-09 DIAGNOSIS — D72829 Elevated white blood cell count, unspecified: Secondary | ICD-10-CM | POA: Diagnosis not present

## 2017-09-09 DIAGNOSIS — N179 Acute kidney failure, unspecified: Secondary | ICD-10-CM | POA: Diagnosis not present

## 2017-09-09 DIAGNOSIS — N183 Chronic kidney disease, stage 3 (moderate): Secondary | ICD-10-CM | POA: Diagnosis not present

## 2017-09-09 DIAGNOSIS — Z931 Gastrostomy status: Secondary | ICD-10-CM | POA: Diagnosis not present

## 2017-09-09 DIAGNOSIS — E43 Unspecified severe protein-calorie malnutrition: Secondary | ICD-10-CM | POA: Diagnosis present

## 2017-09-09 DIAGNOSIS — K659 Peritonitis, unspecified: Secondary | ICD-10-CM | POA: Diagnosis not present

## 2017-09-09 DIAGNOSIS — G9340 Encephalopathy, unspecified: Secondary | ICD-10-CM | POA: Diagnosis not present

## 2017-09-09 DIAGNOSIS — K650 Generalized (acute) peritonitis: Secondary | ICD-10-CM | POA: Diagnosis not present

## 2017-09-09 DIAGNOSIS — Z23 Encounter for immunization: Secondary | ICD-10-CM | POA: Diagnosis present

## 2017-09-09 DIAGNOSIS — K567 Ileus, unspecified: Secondary | ICD-10-CM | POA: Diagnosis not present

## 2017-09-09 DIAGNOSIS — R6510 Systemic inflammatory response syndrome (SIRS) of non-infectious origin without acute organ dysfunction: Secondary | ICD-10-CM | POA: Diagnosis not present

## 2017-09-09 DIAGNOSIS — K449 Diaphragmatic hernia without obstruction or gangrene: Secondary | ICD-10-CM | POA: Diagnosis present

## 2017-09-09 DIAGNOSIS — D62 Acute posthemorrhagic anemia: Secondary | ICD-10-CM | POA: Diagnosis not present

## 2017-09-09 DIAGNOSIS — Q398 Other congenital malformations of esophagus: Secondary | ICD-10-CM | POA: Diagnosis not present

## 2017-09-09 DIAGNOSIS — I361 Nonrheumatic tricuspid (valve) insufficiency: Secondary | ICD-10-CM | POA: Diagnosis not present

## 2017-09-09 DIAGNOSIS — K660 Peritoneal adhesions (postprocedural) (postinfection): Secondary | ICD-10-CM | POA: Diagnosis present

## 2017-09-09 DIAGNOSIS — Z9889 Other specified postprocedural states: Secondary | ICD-10-CM | POA: Diagnosis not present

## 2017-09-09 DIAGNOSIS — Z8719 Personal history of other diseases of the digestive system: Secondary | ICD-10-CM | POA: Diagnosis not present

## 2017-09-09 DIAGNOSIS — R601 Generalized edema: Secondary | ICD-10-CM | POA: Diagnosis not present

## 2017-09-09 DIAGNOSIS — B377 Candidal sepsis: Secondary | ICD-10-CM | POA: Diagnosis not present

## 2017-09-09 DIAGNOSIS — N823 Fistula of vagina to large intestine: Secondary | ICD-10-CM | POA: Diagnosis present

## 2017-09-09 DIAGNOSIS — R579 Shock, unspecified: Secondary | ICD-10-CM

## 2017-09-09 DIAGNOSIS — E46 Unspecified protein-calorie malnutrition: Secondary | ICD-10-CM | POA: Diagnosis not present

## 2017-09-09 DIAGNOSIS — D899 Disorder involving the immune mechanism, unspecified: Secondary | ICD-10-CM | POA: Diagnosis not present

## 2017-09-09 DIAGNOSIS — R188 Other ascites: Secondary | ICD-10-CM | POA: Diagnosis not present

## 2017-09-09 DIAGNOSIS — E872 Acidosis: Secondary | ICD-10-CM | POA: Diagnosis not present

## 2017-09-09 LAB — TROPONIN I

## 2017-09-09 LAB — BASIC METABOLIC PANEL
Anion gap: 8 (ref 5–15)
BUN: 20 mg/dL (ref 6–20)
CHLORIDE: 113 mmol/L — AB (ref 101–111)
CO2: 14 mmol/L — ABNORMAL LOW (ref 22–32)
Calcium: 8.1 mg/dL — ABNORMAL LOW (ref 8.9–10.3)
Creatinine, Ser: 1.32 mg/dL — ABNORMAL HIGH (ref 0.44–1.00)
GFR calc non Af Amer: 40 mL/min — ABNORMAL LOW (ref >60)
GFR, EST AFRICAN AMERICAN: 47 mL/min — AB (ref >60)
Glucose, Bld: 163 mg/dL — ABNORMAL HIGH (ref 65–99)
POTASSIUM: 4.1 mmol/L (ref 3.5–5.1)
SODIUM: 135 mmol/L (ref 135–145)

## 2017-09-09 LAB — HEMOGLOBIN A1C
HEMOGLOBIN A1C: 5 % (ref 4.8–5.6)
Mean Plasma Glucose: 96.8 mg/dL

## 2017-09-09 LAB — LACTIC ACID, PLASMA
LACTIC ACID, VENOUS: 4.3 mmol/L — AB (ref 0.5–1.9)
LACTIC ACID, VENOUS: 1.8 mmol/L (ref 0.5–1.9)

## 2017-09-09 LAB — CBC
HCT: 40.3 % (ref 36.0–46.0)
Hemoglobin: 12.9 g/dL (ref 12.0–15.0)
MCH: 29.3 pg (ref 26.0–34.0)
MCHC: 32 g/dL (ref 30.0–36.0)
MCV: 91.4 fL (ref 78.0–100.0)
PLATELETS: 264 10*3/uL (ref 150–400)
RBC: 4.41 MIL/uL (ref 3.87–5.11)
RDW: 16 % — AB (ref 11.5–15.5)
WBC: 35.4 10*3/uL — AB (ref 4.0–10.5)

## 2017-09-09 LAB — GLUCOSE, CAPILLARY
Glucose-Capillary: 112 mg/dL — ABNORMAL HIGH (ref 65–99)
Glucose-Capillary: 85 mg/dL (ref 65–99)

## 2017-09-09 LAB — PROCALCITONIN: PROCALCITONIN: 25.35 ng/mL

## 2017-09-09 LAB — CORTISOL: CORTISOL PLASMA: 24.2 ug/dL

## 2017-09-09 MED ORDER — CHLORHEXIDINE GLUCONATE CLOTH 2 % EX PADS
6.0000 | MEDICATED_PAD | Freq: Every day | CUTANEOUS | Status: DC
  Administered 2017-09-09: 6 via TOPICAL

## 2017-09-09 MED ORDER — SODIUM CHLORIDE 0.9 % IV BOLUS (SEPSIS)
2000.0000 mL | Freq: Once | INTRAVENOUS | Status: AC
  Administered 2017-09-09: 2000 mL via INTRAVENOUS

## 2017-09-09 MED ORDER — PHENYLEPHRINE HCL-NACL 10-0.9 MG/250ML-% IV SOLN
0.0000 ug/min | INTRAVENOUS | Status: DC
Start: 2017-09-09 — End: 2017-09-10
  Administered 2017-09-10: 20 ug/min via INTRAVENOUS
  Administered 2017-09-10: 80 ug/min via INTRAVENOUS
  Filled 2017-09-09: qty 250

## 2017-09-09 MED ORDER — IOPAMIDOL (ISOVUE-370) INJECTION 76%
INTRAVENOUS | Status: AC
  Filled 2017-09-09: qty 100

## 2017-09-09 MED ORDER — SODIUM CHLORIDE 0.9 % IV BOLUS (SEPSIS)
500.0000 mL | Freq: Once | INTRAVENOUS | Status: AC
  Administered 2017-09-09: 500 mL via INTRAVENOUS

## 2017-09-09 MED ORDER — SODIUM CHLORIDE 0.9% FLUSH
10.0000 mL | INTRAVENOUS | Status: DC | PRN
  Administered 2017-09-23: 20 mL
  Administered 2017-09-24 – 2017-09-26 (×2): 10 mL
  Filled 2017-09-09 (×3): qty 40

## 2017-09-09 MED ORDER — HYDROCORTISONE NA SUCCINATE PF 100 MG IJ SOLR
50.0000 mg | Freq: Four times a day (QID) | INTRAMUSCULAR | Status: DC
  Administered 2017-09-09 – 2017-09-10 (×3): 50 mg via INTRAVENOUS
  Filled 2017-09-09 (×3): qty 2

## 2017-09-09 MED ORDER — NALOXONE HCL 0.4 MG/ML IJ SOLN
INTRAMUSCULAR | Status: AC
  Administered 2017-09-09: 13:00:00
  Filled 2017-09-09: qty 1

## 2017-09-09 MED ORDER — SODIUM CHLORIDE 0.9 % IV SOLN
INTRAVENOUS | Status: DC
  Administered 2017-09-09: 14:00:00 via INTRAVENOUS

## 2017-09-09 MED ORDER — TRAMADOL HCL 50 MG PO TABS: 50.0000 mg | ORAL_TABLET | Freq: Four times a day (QID) | ORAL | Status: DC | PRN

## 2017-09-09 MED ORDER — NALOXONE HCL 0.4 MG/ML IJ SOLN: 0.4000 mg | INTRAMUSCULAR | Status: DC | PRN

## 2017-09-09 MED ORDER — IOPAMIDOL (ISOVUE-300) INJECTION 61%
INTRAVENOUS | Status: AC
  Administered 2017-09-09: 30 mL via OROMUCOSAL
  Filled 2017-09-09: qty 150

## 2017-09-09 MED ORDER — HYDROMORPHONE HCL 1 MG/ML IJ SOLN
0.5000 mg | Freq: Once | INTRAMUSCULAR | Status: AC
  Administered 2017-09-09: 0.5 mg via INTRAVENOUS
  Filled 2017-09-09: qty 1

## 2017-09-09 MED ORDER — IOPAMIDOL (ISOVUE-370) INJECTION 76%
INTRAVENOUS | Status: AC
  Administered 2017-09-09
  Administered 2017-09-09: 80 mL via INTRAVENOUS
  Filled 2017-09-09: qty 100

## 2017-09-09 MED ORDER — PANTOPRAZOLE SODIUM 40 MG IV SOLR
40.0000 mg | INTRAVENOUS | Status: DC
  Administered 2017-09-09 – 2017-09-24 (×16): 40 mg via INTRAVENOUS
  Filled 2017-09-09 (×16): qty 40

## 2017-09-09 MED ORDER — FUROSEMIDE 10 MG/ML IJ SOLN
INTRAMUSCULAR | Status: AC
  Administered 2017-09-09: 20 mg
  Filled 2017-09-09: qty 2

## 2017-09-09 MED ORDER — SODIUM CHLORIDE 0.9% FLUSH
10.0000 mL | Freq: Two times a day (BID) | INTRAVENOUS | Status: DC
  Administered 2017-09-09: 10 mL
  Administered 2017-09-11: 20 mL
  Administered 2017-09-11 – 2017-09-12 (×2): 10 mL
  Administered 2017-09-12: 20 mL
  Administered 2017-09-13 (×2): 10 mL
  Administered 2017-09-14: 20 mL
  Administered 2017-09-15 – 2017-09-23 (×12): 10 mL
  Administered 2017-09-24: 30 mL

## 2017-09-09 MED ORDER — INSULIN ASPART 100 UNIT/ML ~~LOC~~ SOLN
0.0000 [IU] | SUBCUTANEOUS | Status: DC
  Administered 2017-09-15 – 2017-09-23 (×4): 1 [IU] via SUBCUTANEOUS

## 2017-09-09 MED ORDER — OXYCODONE HCL 5 MG PO TABS: 5.0000 mg | ORAL_TABLET | ORAL | 0 refills | Status: DC | PRN

## 2017-09-09 MED ORDER — PIPERACILLIN-TAZOBACTAM 3.375 G IVPB
3.3750 g | Freq: Three times a day (TID) | INTRAVENOUS | Status: DC
  Administered 2017-09-09 – 2017-09-10 (×4): 3.375 g via INTRAVENOUS
  Filled 2017-09-09 (×3): qty 50

## 2017-09-09 MED ORDER — VANCOMYCIN HCL IN DEXTROSE 1-5 GM/200ML-% IV SOLN
1000.0000 mg | INTRAVENOUS | Status: DC
  Administered 2017-09-09: 1000 mg via INTRAVENOUS

## 2017-09-09 NOTE — Progress Notes (Signed)
[facility]/Trauma Progress Note  1 Day Post-Op   Assessment/Plan Hiatial Hernia - hx of Crohns disease s/p Procedure(s): DIAGNOSTIC LAPAROSCOPIC PLACEMENT OF gastrostomy TUBE AND GASTROPEXY (N/A) LAPAROSCOPIC LYSIS OF ADHESIONS (N/A), Dr. Rosendo Gros, 10/03  - pt developed dyspnea, tachycardia and hypotension today - responded to narcan  - CCM involved and appreciate their assistance - concerns for aspiration  - CT angio chest and abd/pel pending   LOS: 0 days    Subjective:  Pt states she is feeling better and her breathing is improved. She is less short of breath. Her abdominal pain is unchanged and no worse. She feels intermittently light headed. She denies CP.   Objective: Vital signs in last 24 hours: Temp:  [97.3 F (36.3 C)-98.7 F (37.1 C)] 98.3 F (36.8 C) (10/04 1140) Pulse Rate:  [86-133] 125 (10/04 1600) Resp:  [12-37] 29 (10/04 1600) BP: (66-142)/(42-77) 100/68 (10/04 1600) SpO2:  [96 %-100 %] 97 % (10/04 1600) Last BM Date: 09/08/17  Intake/Output from previous day: 10/03 0701 - 10/04 0700 In: 3008.3 [I.V.:3008.3] Out: 1400 [Urine:1350; Blood:50] Intake/Output this shift: Total I/O In: 300 [P.O.:50; IV Piggyback:250] Out: 0   PE: Gen:  Alert, NAD, sitting up in bed Card:  tachycardic, no M/G/R heard Pulm:  Mild b/l lower base crackles, rate increased, increased effort  Abd: Soft, not distended, G tube in place, mild generalized TTP with worse pain at the umbilicus, mild guarding Skin: no rashes noted, warm and dry   Anti-infectives: Anti-infectives    Start     Dose/Rate Route Frequency Ordered Stop   09/09/17 1400  vancomycin (VANCOCIN) IVPB 1000 mg/200 mL premix     1,000 mg 200 mL/hr over 60 Minutes Intravenous Every 24 hours 09/09/17 1346     09/09/17 1345  piperacillin-tazobactam (ZOSYN) IVPB 3.375 g     3.375 g 12.5 mL/hr over 240 Minutes Intravenous Every 8 hours 09/09/17 1344     09/08/17 1000  vancomycin (VANCOCIN) IVPB  1000 mg/200 mL premix     1,000 mg 200 mL/hr over 60 Minutes Intravenous  Once 09/08/17 0946 09/08/17 1100   09/08/17 0950  ceFAZolin (ANCEF) 2-4 GM/100ML-% IVPB    Comments:  Bridget Hartshorn   : cabinet override      09/08/17 0950 09/08/17 1040   09/08/17 0947  vancomycin (VANCOCIN) 1-5 GM/200ML-% IVPB  Status:  Discontinued    Comments:  Bridget Hartshorn   : cabinet override      09/08/17 0947 09/08/17 0956   09/08/17 0941  vancomycin (VANCOCIN) 1-5 GM/200ML-% IVPB    Comments:  Dellie Catholic   : cabinet override      09/08/17 0941 09/08/17 1000   09/08/17 0810  ceFAZolin (ANCEF) IVPB 2g/100 mL premix     2 g 200 mL/hr over 30 Minutes Intravenous On call to O.R. 09/08/17 0811 09/08/17 1050      Lab Results:   Recent Labs  09/09/17 0529  WBC 35.4*  HGB 12.9  HCT 40.3  PLT 264   BMET  Recent Labs  09/09/17 0517  NA 135  K 4.1  CL 113*  CO2 14*  GLUCOSE 163*  BUN 20  CREATININE 1.32*  CALCIUM 8.1*   PT/INR No results for input(s): LABPROT, INR in the last 72 hours. CMP     Component Value Date/Time   NA 135 09/09/2017 0517   K 4.1 09/09/2017 0517   CL 113 (H) 09/09/2017 0517   CO2 14 (L) 09/09/2017 0517   GLUCOSE 163 (  H) 09/09/2017 0517   BUN 20 09/09/2017 0517   CREATININE 1.32 (H) 09/09/2017 0517   CREATININE 1.37 (H) 03/01/2017 1149   CALCIUM 8.1 (L) 09/09/2017 0517   PROT 7.0 07/28/2017 0500   ALBUMIN 2.8 (L) 07/28/2017 0500   AST 21 07/28/2017 0500   ALT 15 07/28/2017 0500   ALKPHOS 102 07/28/2017 0500   BILITOT 0.4 07/28/2017 0500   GFRNONAA 40 (L) 09/09/2017 0517   GFRNONAA 42 (L) 11/15/2013 0934   GFRAA 47 (L) 09/09/2017 0517   GFRAA 49 (L) 11/15/2013 0934   Lipase     Component Value Date/Time   LIPASE 27 07/27/2017 1323    Studies/Results: Dg Chest Port 1 View  Result Date: 09/09/2017 CLINICAL DATA:  Follow-up shortness of breath history of a hiatal hernia. EXAM: PORTABLE CHEST 1 VIEW COMPARISON:  Chest x-ray of June 12, 2017 FINDINGS:  There is marked dilation of a large diaphragmatic hernia containing gas-filled stomach and possibly bowel. A tubular structure overlies this region which could reflect a G-tube in the appropriate setting. There is bibasilar atelectasis adjacent to the hiatal hernia- intrathoracic stomach. There is no pleural effusion. The cardiac silhouette is enlarged. The pulmonary vascularity is not engorged. IMPRESSION: Subsegmental atelectasis adjacent to the large diaphragmatic hernia. There is gaseous distention of the stomach. There may be bowel loops above the hemidiaphragm as well. No discrete pneumonia. No CHF. Electronically Signed   By: David  Martinique M.D.   On: 09/09/2017 08:45   Dg Esophagus W/water Sol Cm  Result Date: 09/09/2017 CLINICAL DATA:  Hiatal hernia. Recent gastrostomy tube placement. "Rule out leak." EXAM: ESOPHOGRAM/BARIUM SWALLOW TECHNIQUE: Single contrast examination was performed using water-soluble contrast. FLUOROSCOPY TIME:  Fluoroscopy Time:  1.0 minutes Radiation Exposure Index (if provided by the fluoroscopic device): 10.4 Number of Acquired Spot Images: 0 COMPARISON:  06/10/2017 and chest CT from 05/08/2017 FINDINGS: The patient tolerated taking 3 small sips of water soluble contrast, a total of about 20 cc. After that she refused all further imaging. She would only allow imaging in the supine position hunched forward about 40 degrees by wedge pillows. On the swallows, the esophagus is noted to deviate laterally to the right before emptying into the very large intrathoracic hiatal hernia at projects over the central chest partially due to the patient positioning. There is some degree of esophageal dysmotility, as well as distal esophageal irregularity. Contrast surrounds the distal esophagus but is probably still within the very large hiatal hernia, which is distended with gas. I tilted the table up about 6 or 7 degrees, which is the most that the patient could tolerate without starting to  slide. I was not able to get contrast to empty down into the intra- abdominal portion of the stomach. I am able to see a filling defect in the gastric bubble intra-abdominal portion compatible with the G-tube retention mechanism. IMPRESSION: 1. Today's exam does not exclude leak from the esophagus or stomach. The exam is simply too limited and the patient is not a suitable candidate for fluoroscopic imaging of this type. Esophagram requires the patient's ability to turn a different angles and swallow more than a sip or two of contrast, and this patient's required positioning today, sitting up about 40 degrees with respect to the plane of imaging, is particularly unsuitable to fluoroscopic diagnosis. 2. From what I can tell on the limited assessment achievable, the esophagus is bunched over to the right and empties into a very large hiatal hernia containing at least most of  the stomach. The contrast fills along the posterior wall of the hiatal hernia. The esophagus is irregular distally, and ulceration is not excluded. Chest CT might be an alternative although I am not certain that the patient will tolerate laying down for chest CT. Electronically Signed   By: Van Clines M.D.   On: 09/09/2017 11:25      Kalman Drape , [facility] Surgery 09/09/2017, 4:18 PM Pager: [phone] Consults: [phone] Mon-Fri 7:00 am-4:30 pm Sat-Sun 7:00 am-11:30 am

## 2017-09-09 NOTE — Consult Note (Signed)
[facility] H&P    Patient Demographics:    Danielle [name], is a 71 y.o. female  MRN: [id number]  DOB - 01/28/1946  Admit Date - 09/08/2017  Referring MD/NP/PA: Dr Rosendo Gros  Outpatient Primary MD for the patient is Iona Beard, MD  Reason for consult-hypotension, tachycardia     HPI:    Danielle [name]  is a 71 y.o. female, With history of Crohn disease, hiatal hernia, GERD who underwent laparoscopic placement of G-tube and gastropexy, lysis of adhesions who developed hypotension and tachycardia this morning. WBC this morning was 35.4, but she did receive Decadron yesterday. She has been afebrile. Has been tachypnic with Shortness of breath, she denies chest pain. Patient currently was given Lasix 20 mg IV 1 for possible fluid overload.  Chest x-ray showed subsegmental atelectasis adjacent to large diaphragmatic hernia. There is  gaseous  distention of the stomach.  She denies dysuria. No nausea vomiting or diarrhea. Patient has moved to stepdown unit, blood pressure still remains low with systolic blood pressure in 80s. Patient is alert and oriented 3 She is afebrile   Review of systems:    In addition to the HPI above,    [name] other systems reviewed and are negative.   With Past History of the following :    Past Medical History:  Diagnosis Date  . Crohn disease ([place])   . Hiatal hernia 12/07/2015  . Psoriasis       Past Surgical History:  Procedure Laterality Date  . ANAL STENOSIS  ~ 2010  . APPENDECTOMY  1980's  . BOWEL RESECTION  2009  . CHOLECYSTECTOMY  1990's  . COLONOSCOPY  2013  . COLONOSCOPY WITH PROPOFOL N/A 03/12/2017   Procedure: COLONOSCOPY WITH PROPOFOL;  Surgeon: Rogene Houston, MD;  [facility];  Service: Endoscopy;  Laterality: N/A;  2:05-moved up to 1025 per Lelon Frohlich  . COLOSTOMY  2009   DEMASO  . COLOSTOMY TAKEDOWN  2009   "only had it for about 1 month" (08/22/2013)    . ESOPHAGEAL DILATION N/A 03/12/2017   Procedure: ESOPHAGEAL DILATION;  Surgeon: Rogene Houston, MD;  [facility];  Service: Endoscopy;  Laterality: N/A;  . ESOPHAGOGASTRODUODENOSCOPY  11/11/2011   Procedure: ESOPHAGOGASTRODUODENOSCOPY (EGD);  Surgeon: Missy Sabins, MD;  Location[facility] ENDOSCOPY;  Service: Endoscopy;  Laterality: N/A;  . ESOPHAGOGASTRODUODENOSCOPY  05/18/2012   Procedure: ESOPHAGOGASTRODUODENOSCOPY (EGD);  Surgeon: Rogene Houston, MD;  [facility]: [facility];  Service: Endoscopy;  Laterality: N/A;  200  . ESOPHAGOGASTRODUODENOSCOPY (EGD) WITH PROPOFOL N/A 03/12/2017   Procedure: ESOPHAGOGASTRODUODENOSCOPY (EGD) WITH PROPOFOL;  Surgeon: Rogene Houston, MD;  [facility];  Service: Endoscopy;  Laterality: N/A;  . FLEXIBLE SIGMOIDOSCOPY  11/11/2011   Procedure: FLEXIBLE SIGMOIDOSCOPY;  Surgeon: Missy Sabins, MD;  Location: [place];  Service: Endoscopy;  Laterality: N/A;  . goiter  1999  . HIATAL HERNIA REPAIR N/A 09/08/2017   Procedure: DIAGNOSTIC LAPAROSCOPIC PLACEMENT OF gastrostomy TUBE AND GASTROPEXY;  Surgeon: Ralene Ok, MD;  Location: [facility];  Service: General;  Laterality: N/A;  . INCISION AND DRAINAGE PERIRECTAL ABSCESS N/A 04/21/2017   Procedure: IRRIGATION AND DEBRIDEMENT PERIRECTAL ABSCESS;  Surgeon: Coralie Keens, MD;  Location: [place];  Service: General;  Laterality: N/A;  . LAPAROSCOPIC LYSIS OF ADHESIONS N/A 09/08/2017   Procedure: LAPAROSCOPIC LYSIS OF ADHESIONS;  Surgeon: Ralene Ok, MD;  Location: [facility];  Service: General;  Laterality: N/A;  . LEFT HEART CATHETERIZATION WITH CORONARY ANGIOGRAM N/A 07/09/2014   Procedure: LEFT HEART CATHETERIZATION WITH CORONARY ANGIOGRAM;  Surgeon: Peter M Martinique, MD;  Location: [facility] CATH LAB;  Service: Cardiovascular;  Laterality: N/A;  . PROCTOSCOPY N/A 01/31/2015   Procedure: RIGID PROCTOSCOPY;  Surgeon: Leighton Ruff, MD;  Location: [place];  Service: General;  Laterality: N/A;  . RECTAL EXAM  UNDER ANESTHESIA N/A 01/31/2015   Procedure: RECTAL EXAM UNDER ANESTHESIA WITH PLACEMENT OF A SETON;  Surgeon: Leighton Ruff, MD;  Location: [place];  Service: General;  Laterality: N/A;      Social History:      Social History  Substance Use Topics  . Smoking status: Former Smoker    Packs/day: 0.12    Years: 4.00    Types: Cigarettes    Quit date: 10/05/1996  . Smokeless tobacco: Never Used     Comment: 08/22/2013 1 pack every two weeks when she did smoked  . Alcohol use No       Family History :     Family History  Problem Relation Age of Onset  . Diabetes Mother   . Heart disease Father   . Healthy Sister   . Hypertension Brother   . Colon cancer Brother        died with colon cancer  . Crohn's disease Paternal Aunt       Home Medications:   Prior to Admission medications   Medication Sig Start Date End Date Taking? Authorizing Provider  albuterol (PROVENTIL HFA;VENTOLIN HFA) 108 (90 Base) MCG/ACT inhaler Inhale 2 puffs into the lungs every 6 (six) hours as needed for wheezing. 12/09/15  Yes Rama, Venetia Maxon, MD  capsicum oleoresin (TRIXAICIN) 0.025 % cream Apply topically 3 (three) times daily. Patient taking differently: Apply 1 application topically 3 (three) times daily as needed (abscess).  06/15/17  Yes Nita Sells, MD  cyanocobalamin (,VITAMIN B-12,) 1000 MCG/ML injection INJECT 1000 MCG IM ONCE MONTHLY 07/09/17  Yes Rehman, Mechele Dawley, MD  dicyclomine (BENTYL) 10 MG/5ML syrup TAKE 10 MLS (20 MG TOTAL) BY MOUTH 3 (THREE) TIMES DAILY AS NEEDED. Patient taking differently: Take 20 mg by mouth 3 (three) times daily as needed (stomach cramps).  07/26/17  Yes Rehman, Mechele Dawley, MD  diphenoxylate-atropine (LOMOTIL) 2.5-0.025 MG tablet TAKE 1 TABLET BY MOUTH FOUR TIMES A DAY AS NEEDED Patient taking differently: Take one tablet twice daily. 06/28/17  Yes Setzer, Rona Ravens, NP  furosemide (LASIX) 20 MG tablet Take 1 tablet (20 mg total) by mouth daily as needed (Take for  weight gain of more than 2 pounds in a day or 5 pounds in 2 days.). 06/16/17  Yes Lavina Hamman, MD  HYDROcodone-acetaminophen (NORCO) 7.5-325 MG tablet Take 1 tablet by mouth every 12 (twelve) hours as needed for moderate pain.   Yes [provider]  nystatin (NYSTATIN) powder Apply 1 g topically 2 (two) times daily as needed (for itching/irritation).   Yes [provider]  pantoprazole (PROTONIX) 40 MG tablet Take 1 tablet (40 mg total) by mouth daily. 06/16/17  Yes Nita Sells, MD  Simethicone (PHAZYME PO) Take 1  tablet by mouth daily as needed (gas).    Yes [provider]  sodium bicarbonate 650 MG tablet TAKE 1 TABLET (650 MG TOTAL) BY MOUTH 2 (TWO) TIMES DAILY. 05/14/17  Yes [provider]  spironolactone (ALDACTONE) 50 MG tablet Take 50 mg by mouth daily.  06/23/17  Yes [provider]  oxyCODONE (OXY IR/ROXICODONE) 5 MG immediate release tablet Take 1-2 tablets (5-10 mg total) by mouth every 4 (four) hours as needed for moderate pain. 09/09/17   Ralene Ok, MD     Allergies:     Allergies  Allergen Reactions  . Zithromax [Azithromycin] Swelling and Other (See Comments)    Reaction:  Mouth swelling and blisters   . Aspirin Other (See Comments)    Reaction:  Stomach cramping   . Ibuprofen Other (See Comments)    Pt states that she prefers not to take this medication.   . Iron Nausea And Vomiting    FERROUS SULFATE  . Sulfa Antibiotics Nausea And Vomiting     Physical Exam:   Vitals  Blood pressure 96/61, pulse (!) 129, temperature 98.3 F (36.8 C), temperature source Oral, resp. rate 15, height 5' 5.25" (1.657 m), weight 60.3 kg (133 lb), SpO2 99 %.  1.  General: Appears in mild respiratory distress  2. Psychiatric:  Intact judgement and  insight, awake alert, oriented x 3.  3. Neurologic: No focal neurological deficits, [name] cranial nerves intact.Strength 5/5 [name] 4 extremities, sensation intact [name] 4 extremities,  plantars down going.  4. Eyes :  anicteric sclerae, moist conjunctivae with no lid lag. PERRLA.  5. ENMT:  Oropharynx clear with moist mucous membranes and good dentition  6. Neck:  supple, no cervical lymphadenopathy appriciated, No thyromegaly  7. Respiratory : Normal respiratory effort, good air movement bilaterally,clear to  auscultation bilaterally  8. Cardiovascular : RRR, no gallops, rubs or murmurs, no leg edema  9. Gastrointestinal:  Positive bowel sounds, abdomen soft, non-tender to palpation,no hepatosplenomegaly, no rigidity or guarding       10. Skin:  No cyanosis, normal texture and turgor, no rash, lesions or ulcers  11.Musculoskeletal:  Good muscle tone,  joints appear normal , no effusions,  normal range of motion    Data Review:    CBC  Recent Labs Lab 09/09/17 0529  WBC 35.4*  HGB 12.9  HCT 40.3  PLT 264  MCV 91.4  MCH 29.3  MCHC 32.0  RDW 16.0*   ------------------------------------------------------------------------------------------------------------------  Chemistries   Recent Labs Lab 09/09/17 0517  NA 135  K 4.1  CL 113*  CO2 14*  GLUCOSE 163*  BUN 20  CREATININE 1.32*  CALCIUM 8.1*   ------------------------------------------------------------------------------------------------------------------  ------------------------------------------------------------------------------------------------------------------ GFR: Estimated Creatinine Clearance: 36.6 mL/min (A) (by C-G formula based on SCr of 1.32 mg/dL (H)). Liver Function Tests: No results for input(s): AST, ALT, ALKPHOS, BILITOT, PROT, ALBUMIN in the last 168 hours. No results for input(s): LIPASE, AMYLASE in the last 168 hours. No results for input(s): AMMONIA in the last 168 hours. Coagulation Profile: No results for input(s): INR, PROTIME in the last 168 hours. Cardiac Enzymes:  Recent Labs Lab 09/09/17 0529  TROPONINI <0.03   BNP (last 3 results) No  results for input(s): PROBNP in the last 8760 hours. HbA1C: No results for input(s): HGBA1C in the last 72 hours. CBG:  Recent Labs Lab 09/09/17 1230  GLUCAP 112*   Lipid Profile: No results for input(s): CHOL, HDL, LDLCALC, TRIG, CHOLHDL, LDLDIRECT in the last 72 hours. Thyroid Function  Tests: No results for input(s): TSH, T4TOTAL, FREET4, T3FREE, THYROIDAB in the last 72 hours. Anemia Panel: No results for input(s): VITAMINB12, FOLATE, FERRITIN, TIBC, IRON, RETICCTPCT in the last 72 hours.  --------------------------------------------------------------------------------------------------------------- Urine analysis:    Component Value Date/Time   COLORURINE YELLOW 06/08/2017 1539   APPEARANCEUR CLOUDY (A) 06/08/2017 1539   LABSPEC 1.010 06/08/2017 1539   PHURINE 6.0 06/08/2017 1539   GLUCOSEU NEGATIVE 06/08/2017 1539   HGBUR MODERATE (A) 06/08/2017 1539   BILIRUBINUR NEGATIVE 06/08/2017 1539   KETONESUR NEGATIVE 06/08/2017 1539   PROTEINUR 30 (A) 06/08/2017 1539   UROBILINOGEN 0.2 07/29/2015 2231   NITRITE NEGATIVE 06/08/2017 1539   LEUKOCYTESUR LARGE (A) 06/08/2017 1539      Imaging Results:    Dg Chest Port 1 View  Result Date: 09/09/2017 CLINICAL DATA:  Follow-up shortness of breath history of a hiatal hernia. EXAM: PORTABLE CHEST 1 VIEW COMPARISON:  Chest x-ray of June 12, 2017 FINDINGS: There is marked dilation of a large diaphragmatic hernia containing gas-filled stomach and possibly bowel. A tubular structure overlies this region which could reflect a G-tube in the appropriate setting. There is bibasilar atelectasis adjacent to the hiatal hernia- intrathoracic stomach. There is no pleural effusion. The cardiac silhouette is enlarged. The pulmonary vascularity is not engorged. IMPRESSION: Subsegmental atelectasis adjacent to the large diaphragmatic hernia. There is gaseous distention of the stomach. There may be bowel loops above the hemidiaphragm as well. No discrete  pneumonia. No CHF. Electronically Signed   By: David  Martinique M.D.   On: 09/09/2017 08:45   Dg Esophagus W/water Sol Cm  Result Date: 09/09/2017 CLINICAL DATA:  Hiatal hernia. Recent gastrostomy tube placement. "Rule out leak." EXAM: ESOPHOGRAM/BARIUM SWALLOW TECHNIQUE: Single contrast examination was performed using water-soluble contrast. FLUOROSCOPY TIME:  Fluoroscopy Time:  1.0 minutes Radiation Exposure Index (if provided by the fluoroscopic device): 10.4 Number of Acquired Spot Images: 0 COMPARISON:  06/10/2017 and chest CT from 05/08/2017 FINDINGS: The patient tolerated taking 3 small sips of water soluble contrast, a total of about 20 cc. After that she refused [name] further imaging. She would only allow imaging in the supine position hunched forward about 40 degrees by wedge pillows. On the swallows, the esophagus is noted to deviate laterally to the right before emptying into the very large intrathoracic hiatal hernia at projects over the central chest partially due to the patient positioning. There is some degree of esophageal dysmotility, as well as distal esophageal irregularity. Contrast surrounds the distal esophagus but is probably still within the very large hiatal hernia, which is distended with gas. I tilted the table up about 6 or 7 degrees, which is the most that the patient could tolerate without starting to slide. I was not able to get contrast to empty down into the intra- abdominal portion of the stomach. I am able to see a filling defect in the gastric bubble intra-abdominal portion compatible with the G-tube retention mechanism. IMPRESSION: 1. Today's exam does not exclude leak from the esophagus or stomach. The exam is simply too limited and the patient is not a suitable candidate for fluoroscopic imaging of this type. Esophagram requires the patient's ability to turn a different angles and swallow more than a sip or two of contrast, and this patient's required positioning today, sitting  up about 40 degrees with respect to the plane of imaging, is particularly unsuitable to fluoroscopic diagnosis. 2. From what I can tell on the limited assessment achievable, the esophagus is bunched over to the right  and empties into a very large hiatal hernia containing at least most of the stomach. The contrast fills along the posterior wall of the hiatal hernia. The esophagus is irregular distally, and ulceration is not excluded. Chest CT might be an alternative although I am not certain that the patient will tolerate laying down for chest CT. Electronically Signed   By: Van Clines M.D.   On: 09/09/2017 11:25    My personal review of EKG: Sinus tachycardia   Assessment & Plan:    Active Problems:   Gastrostomy tube in place ([place])   1. SIRS- Patient developed hypotension, sinus tachycardia this morning WBC elevated to 35,000. She continues to be afebrile. No clear source of infection. Will get a urine analysis, urine culture, blood cultures 2. Chest x-ray shows no pneumonia. Will empirically start on vancomycin and Zosyn. Patient doesn't not appear to be volume overloaded, we'll start IV normal saline at 125 per hour. Will check lactic acid every 3 hours. 2. Hypotension, tachycardia- will also check CT angio chest to rule out pulmonary embolism. 3. Status post laparoscopic G-tube placement, lysis of adhesions, large paraesophageal hiatal hernia- general surgery following.      Eleonore Chiquito S M.D on 09/09/2017 at 1:31 PM  Between 7am to 7pm - Pager - [phone]. After 7pm go to [email or web address] - password [facility]  [facility] - Office  [phone]

## 2017-09-09 NOTE — Consult Note (Signed)
[facility] Nurse wound consult note Reason for Consult: G tube education, skin protection Wound type: G tube insertion site Pressure Injury POA: NA Measurement:na Wound bed:na Drainage (amount, consistency, odor) copious serosanguinous drainage Periwound:intact currently Dressing procedure/placement/frequency: G tube is in a deep crease, while attempting to apply a convex barrier and then a barrier ring pt is becoming less and less responsive.  VS taken, 70's/30's HR 160 hands very cold, unable to get accurate oxygen sat level. Dressing applied, Rapid Response Nurse called to room. We will not follow, but will remain available to this patient, to nursing, and the medical and/or surgical teams.   Fara Olden, RN-C, WTA-C Wound Treatment Associate

## 2017-09-09 NOTE — Progress Notes (Signed)
Peripherally Inserted Central Catheter/Midline Placement  The IV Nurse has discussed with the patient and/or persons authorized to consent for the patient, the purpose of this procedure and the potential benefits and risks involved with this procedure.  The benefits include less needle sticks, lab draws from the catheter, and the patient may be discharged home with the catheter. Risks include, but not limited to, infection, bleeding, blood clot (thrombus formation), and puncture of an artery; nerve damage and irregular heartbeat and possibility to perform a PICC exchange if needed/ordered by physician.  Alternatives to this procedure were also discussed.  Bard Power PICC patient education guide, fact sheet on infection prevention and patient information card has been provided to patient /or left at bedside.    PICC/Midline Placement Documentation        Danielle [name] 09/09/2017, 5:44 PM

## 2017-09-09 NOTE — Progress Notes (Signed)
Pharmacy Antibiotic Note  Danielle [name] is a 71 y.o. female admitted on 09/08/2017 with paraesophageal hiatal hernia s/p laparoscopic G-tube placement and gastropexy, LOA.  Pharmacy has been consulted for Vancomycin and Zosyn dosing for suspected sepsis on POD1.  Today, 09/09/2017: BP 70's/30's HR 160  Afebrile WBC 35.4 (Decadron 10mg  IV given 10/3 intra-op) SCr 1.32, CrCl ~ 36 ml/min.  Plan:  Zosyn 3.375g IV Q8H infused over 4hrs.  Vancomycin 1g IV q24h.  Measure Vanc trough at steady state.  Daily SCr  Follow up renal fxn, culture results, and clinical course.    Height: 5' 5.25" (165.7 cm) Weight: 133 lb (60.3 kg) IBW/kg (Calculated) : 57.58  Temp (24hrs), Avg:98 F (36.7 C), Min:97.3 F (36.3 C), Max:98.7 F (37.1 C)   Recent Labs Lab 09/09/17 0517 09/09/17 0529  WBC  --  35.4*  CREATININE 1.32*  --     Estimated Creatinine Clearance: 36.6 mL/min (A) (by C-G formula based on SCr of 1.32 mg/dL (H)).    Allergies  Allergen Reactions  . Zithromax [Azithromycin] Swelling and Other (See Comments)    Reaction:  Mouth swelling and blisters   . Aspirin Other (See Comments)    Reaction:  Stomach cramping   . Ibuprofen Other (See Comments)    Pt states that she prefers not to take this medication.   . Iron Nausea And Vomiting    FERROUS SULFATE  . Sulfa Antibiotics Nausea And Vomiting    Antimicrobials this admission: 10/3 Cefazolin x1 dose preop 10/3 Vanc x1 preop, then daily starting 10/4 >>  10/4 Zosyn >>   Dose adjustments this admission:  Microbiology results: 10/4 BCx: sent 10/4 UCx:   Thank you for allowing pharmacy to be a part of this patient's care.  Gretta Arab PharmD, BCPS Pager [phone] 09/09/2017 1:51 PM

## 2017-09-09 NOTE — Progress Notes (Signed)
Pt BP was 74/42, during shift BP was low. Her heart rate increased to 170 and O2 was 88% on 2 liters of oxygen. She had been sleepy most of the shift. She was breathing with her accessory muscles. The doctor was notified. Rapid response was called, they did an EKG and gave her narcan and lasix through her IV. Pt became more alert after Narcan was administered. Pt was transferred to stepdown ICU.

## 2017-09-09 NOTE — Significant Event (Signed)
Rapid Response Event Note  Overview:   Event Type: Cardiac  Onset: 1215 Arrival Time: 1220 Notified by bedside RN Adriana in regards to patient HR elevating to 170s and hypotensive. Upon arrival, patient did appear drowsy but able to respond to commands. Patient did receive pain medication (1 Norco tablet) at 1139. Patient also was tachypneac.   Initial Focused Assessment: Neuro: Patient would open eyes to voice and follow commands but drowsy and would have a hard time staying awake. Pupils were 1-2 +, reactive and equal to light. Purposeful movement in all extremities. No complaints of pain at this time.  Cardiac: EKG completed (see results), ST, 1+ in both radial and pedal pulses bilaterally, patient BP was between upper 90S-11D systolic. Pulmonary: Breath sounds in lower lung fields had fine crackles, breath sounds in upper lung fields were diminished, RR 20s-30s, O2 Sats on 3 Liters 93-95%.   Interventions: Paged MD Rosendo Gros attending Overrode Narcan 0.4mg  to given for being drowsy due to pain medication MD Rosendo Gros ordered 20mg  of lasix to be given and DC fluids for now [facility] and Transfer to [place] (if not transferred):  Event Summary:   at      at          [facility]

## 2017-09-09 NOTE — Progress Notes (Signed)
Paged Dr. Marcello Moores with VS BP 99/73, pulse 132, received orders for EKG and troponin level. Paged MD with EKG results. Called rapid response to discuss patient and since contacting the doctor VS BP 91/65, pulse 120, RR 12, 98% on 2L and EKG results as well- RR did not seem to be concerned and wanted to wait to hear back from MD. Received orders from Dr Marcello Moores for 596mL bolus over 3 hours.

## 2017-09-09 NOTE — Progress Notes (Addendum)
Patient ID: Danielle [name], female   DOB: 09-11-1946, 71 y.o.   MRN: [id number]  Came to pt's BS to eval pt as she had been having tachycardia and hypotension and dyspnea. RR team called.  Pt was given Narcan.  Pt responded appropriately after narcan.  No abdominal pain.  Pt w/ some crackles bilat.  A/P:  Will give 20mg  Lasix now Will have the IM team eval for any other pulm etiology.  Pt with no CP or abdominal pain at this time. Will trx to SDU for more acute OBS. Will DC narcotic Rx

## 2017-09-09 NOTE — Consult Note (Signed)
PULMONARY / CRITICAL CARE MEDICINE   Name: Danielle [name] MRN: [id number] DOB: Jan 30, 1946    ADMISSION DATE:  09/08/2017   CONSULTATION DATE:  09/09/2017  REFERRING MD:  Eleonore Chiquito, M.D. / [facility]  CHIEF COMPLAINT:  Hypotension  HISTORY OF PRESENT ILLNESS:  71 y.o. female presenting for laparoscopic repair of hiatal hernia with fundoplication and lysis of adhesions. This morning patient was noted to be tachycardic with hypotension. White blood cell count was significantly elevated. Patient did receive a dose of Decadron yesterday but has been largely afebrile. Patient was also given Lasix IV 1 for possible fluid overload. She was subsequently transitioned to the stepdown unit and internal medicine was consulted. They subsequently consulted me with PCCM with ongoing hypotension. Patient does endorse increased dyspnea but no subjective fever or chills. Does have some abdominal pain but nothing significant. Does have some nausea. Reports she felt well prior to her surgery. Denies any recent sick contacts. Denies any chronic steroids. Patient did receive perioperative prophylactic antibiotics yesterday including Ancef and vancomycin.  PAST MEDICAL HISTORY :  Past Medical History:  Diagnosis Date  . Crohn disease ([place])   . Hiatal hernia 12/07/2015  . Psoriasis     PAST SURGICAL HISTORY: Past Surgical History:  Procedure Laterality Date  . ANAL STENOSIS  ~ 2010  . APPENDECTOMY  1980's  . BOWEL RESECTION  2009  . CHOLECYSTECTOMY  1990's  . COLONOSCOPY  2013  . COLONOSCOPY WITH PROPOFOL N/A 03/12/2017   Procedure: COLONOSCOPY WITH PROPOFOL;  Surgeon: Rogene Houston, MD;  [facility];  Service: Endoscopy;  Laterality: N/A;  2:05-moved up to 1025 per Lelon Frohlich  . COLOSTOMY  2009   DEMASO  . COLOSTOMY TAKEDOWN  2009   "only had it for about 1 month" (08/22/2013)  . ESOPHAGEAL DILATION N/A 03/12/2017   Procedure: ESOPHAGEAL DILATION;  Surgeon: Rogene Houston, MD;  [facility];   Service: Endoscopy;  Laterality: N/A;  . ESOPHAGOGASTRODUODENOSCOPY  11/11/2011   Procedure: ESOPHAGOGASTRODUODENOSCOPY (EGD);  Surgeon: Missy Sabins, MD;  [facility] ENDOSCOPY;  Service: Endoscopy;  Laterality: N/A;  . ESOPHAGOGASTRODUODENOSCOPY  05/18/2012   Procedure: ESOPHAGOGASTRODUODENOSCOPY (EGD);  Surgeon: Rogene Houston, MD;  [facility];  Service: Endoscopy;  Laterality: N/A;  200  . ESOPHAGOGASTRODUODENOSCOPY (EGD) WITH PROPOFOL N/A 03/12/2017   Procedure: ESOPHAGOGASTRODUODENOSCOPY (EGD) WITH PROPOFOL;  Surgeon: Rogene Houston, MD;  [facility];  Service: Endoscopy;  Laterality: N/A;  . FLEXIBLE SIGMOIDOSCOPY  11/11/2011   Procedure: FLEXIBLE SIGMOIDOSCOPY;  Surgeon: Missy Sabins, MD;  Location: [place];  Service: Endoscopy;  Laterality: N/A;  . goiter  1999  . HIATAL HERNIA REPAIR N/A 09/08/2017   Procedure: DIAGNOSTIC LAPAROSCOPIC PLACEMENT OF gastrostomy TUBE AND GASTROPEXY;  Surgeon: Ralene Ok, MD;  Location: [facility];  Service: General;  Laterality: N/A;  . INCISION AND DRAINAGE PERIRECTAL ABSCESS N/A 04/21/2017   Procedure: IRRIGATION AND DEBRIDEMENT PERIRECTAL ABSCESS;  Surgeon: Coralie Keens, MD;  Location: [place];  Service: General;  Laterality: N/A;  . LAPAROSCOPIC LYSIS OF ADHESIONS N/A 09/08/2017   Procedure: LAPAROSCOPIC LYSIS OF ADHESIONS;  Surgeon: Ralene Ok, MD;  Location: [facility];  Service: General;  Laterality: N/A;  . LEFT HEART CATHETERIZATION WITH CORONARY ANGIOGRAM N/A 07/09/2014   Procedure: LEFT HEART CATHETERIZATION WITH CORONARY ANGIOGRAM;  Surgeon: Peter M Martinique, MD;  Location[facility] CATH LAB;  Service: Cardiovascular;  Laterality: N/A;  . PROCTOSCOPY N/A 01/31/2015   Procedure: RIGID PROCTOSCOPY;  Surgeon: Leighton Ruff,  MD;  Location: [place];  Service: General;  Laterality: N/A;  . RECTAL EXAM UNDER ANESTHESIA N/A 01/31/2015   Procedure: RECTAL EXAM UNDER ANESTHESIA WITH PLACEMENT OF A SETON;  Surgeon: Leighton Ruff, MD;   Location: [place];  Service: General;  Laterality: N/A;    Allergies  Allergen Reactions  . Zithromax [Azithromycin] Swelling and Other (See Comments)    Reaction:  Mouth swelling and blisters   . Aspirin Other (See Comments)    Reaction:  Stomach cramping   . Ibuprofen Other (See Comments)    Pt states that she prefers not to take this medication.   . Iron Nausea And Vomiting    FERROUS SULFATE  . Sulfa Antibiotics Nausea And Vomiting    No current facility-administered medications on file prior to encounter.    Current Outpatient Prescriptions on File Prior to Encounter  Medication Sig  . albuterol (PROVENTIL HFA;VENTOLIN HFA) 108 (90 Base) MCG/ACT inhaler Inhale 2 puffs into the lungs every 6 (six) hours as needed for wheezing.  . capsicum oleoresin (TRIXAICIN) 0.025 % cream Apply topically 3 (three) times daily. (Patient taking differently: Apply 1 application topically 3 (three) times daily as needed (abscess). )  . cyanocobalamin (,VITAMIN B-12,) 1000 MCG/ML injection INJECT 1000 MCG IM ONCE MONTHLY  . dicyclomine (BENTYL) 10 MG/5ML syrup TAKE 10 MLS (20 MG TOTAL) BY MOUTH 3 (THREE) TIMES DAILY AS NEEDED. (Patient taking differently: Take 20 mg by mouth 3 (three) times daily as needed (stomach cramps). )  . diphenoxylate-atropine (LOMOTIL) 2.5-0.025 MG tablet TAKE 1 TABLET BY MOUTH FOUR TIMES A DAY AS NEEDED (Patient taking differently: Take one tablet twice daily.)  . furosemide (LASIX) 20 MG tablet Take 1 tablet (20 mg total) by mouth daily as needed (Take for weight gain of more than 2 pounds in a day or 5 pounds in 2 days.).  [name] HYDROcodone-acetaminophen (NORCO) 7.5-325 MG tablet Take 1 tablet by mouth every 12 (twelve) hours as needed for moderate pain.  [name] nystatin (NYSTATIN) powder Apply 1 g topically 2 (two) times daily as needed (for itching/irritation).  . pantoprazole (PROTONIX) 40 MG tablet Take 1 tablet (40 mg total) by mouth daily.  . Simethicone (PHAZYME PO) Take 1  tablet by mouth daily as needed (gas).   . sodium bicarbonate 650 MG tablet TAKE 1 TABLET (650 MG TOTAL) BY MOUTH 2 (TWO) TIMES DAILY.  [name] spironolactone (ALDACTONE) 50 MG tablet Take 50 mg by mouth daily.     FAMILY HISTORY:  Family History  Problem Relation Age of Onset  . Diabetes Mother   . Heart disease Father   . Healthy Sister   . Hypertension Brother   . Colon cancer Brother        died with colon cancer  . Crohn's disease Paternal Aunt     SOCIAL HISTORY: Social History   Social History  . Marital status: Single    Spouse name: N/A  . Number of children: N/A  . Years of education: N/A   Social History Main Topics  . Smoking status: Former Smoker    Packs/day: 0.12    Years: 4.00    Types: Cigarettes    Quit date: 10/05/1996  . Smokeless tobacco: Never Used     Comment: 08/22/2013 1 pack every two weeks when she did smoked  . Alcohol use No  . Drug use: No  . Sexual activity: Not Currently    Birth control/ protection: Post-menopausal, Implant   Other Topics Concern  . None  Social History Narrative   Lives in [place].  Moved from [place] in 2008.  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  LIves with her sister.   No home health services.      REVIEW OF SYSTEMS:  No rashes or abnormal bruising. No dysuria or hematuria. A pertinent 14 point review of systems is negative except as per the history of presenting illness.  SUBJECTIVE: As above.  VITAL SIGNS: BP (!) 80/51   Pulse (!) 128   Temp 98.3 F (36.8 C) (Oral)   Resp (!) 27   Ht 5' 5.25" (1.657 m)   Wt 133 lb (60.3 kg)   SpO2 100%   BMI 21.96 kg/m   HEMODYNAMICS:    VENTILATOR SETTINGS:    INTAKE / OUTPUT: I/O last 3 completed shifts: In: 3008.3 [I.V.:3008.3] Out: 1400 [Urine:1350; Blood:50]  PHYSICAL EXAMINATION: General:  Awake. No acute distress. Sister at bedside.  Integument:  Warm. Dry. No rash on exposed skin. Extremities:  No cyanosis or clubbing.  Lymphatics:  No appreciated cervical or  supraclavicular lymphadenoapthy. HEENT:  Moist mucus membranes. Oral thrush. No scleral icterus. Cardiovascular:  Tachycardic. No edema. Unable to appreciate JVD.  Pulmonary:  Diminished breath sounds bilateral lung bases. Mildly increased work of breathing on nasal cannula. Abdomen: Soft. Protuberant. Abdominal binder in place clean and dry. Abdominal drain/gastrostomy tube in place. Musculoskeletal:  Normal bulk and tone. No joint effusion appreciated. Neurological:  CN 2-12 grossly in tact. No meningismus. Moving all 4 extremities equally.  Psychiatric:  Mood and affect congruent. Oriented 4.  LABS:  BMET  Recent Labs Lab 09/09/17 0517  NA 135  K 4.1  CL 113*  CO2 14*  BUN 20  CREATININE 1.32*  GLUCOSE 163*    Electrolytes  Recent Labs Lab 09/09/17 0517  CALCIUM 8.1*    CBC  Recent Labs Lab 09/09/17 0529  WBC 35.4*  HGB 12.9  HCT 40.3  PLT 264    Coag's No results for input(s): APTT, INR in the last 168 hours.  Sepsis Markers  Recent Labs Lab 09/09/17 1409  LATICACIDVEN 4.3*    ABG No results for input(s): PHART, PCO2ART, PO2ART in the last 168 hours.  Liver Enzymes No results for input(s): AST, ALT, ALKPHOS, BILITOT, ALBUMIN in the last 168 hours.  Cardiac Enzymes  Recent Labs Lab 09/09/17 0529  TROPONINI <0.03    Glucose  Recent Labs Lab 09/09/17 1230  GLUCAP 112*    Imaging Dg Chest Port 1 View  Result Date: 09/09/2017 CLINICAL DATA:  Follow-up shortness of breath history of a hiatal hernia. EXAM: PORTABLE CHEST 1 VIEW COMPARISON:  Chest x-ray of June 12, 2017 FINDINGS: There is marked dilation of a large diaphragmatic hernia containing gas-filled stomach and possibly bowel. A tubular structure overlies this region which could reflect a G-tube in the appropriate setting. There is bibasilar atelectasis adjacent to the hiatal hernia- intrathoracic stomach. There is no pleural effusion. The cardiac silhouette is enlarged. The  pulmonary vascularity is not engorged. IMPRESSION: Subsegmental atelectasis adjacent to the large diaphragmatic hernia. There is gaseous distention of the stomach. There may be bowel loops above the hemidiaphragm as well. No discrete pneumonia. No CHF. Electronically Signed   By: David  Martinique M.D.   On: 09/09/2017 08:45   Dg Esophagus W/water Sol Cm  Result Date: 09/09/2017 CLINICAL DATA:  Hiatal hernia. Recent gastrostomy tube placement. "Rule out leak." EXAM: ESOPHOGRAM/BARIUM SWALLOW TECHNIQUE: Single contrast examination was performed using water-soluble contrast. FLUOROSCOPY TIME:  Fluoroscopy Time:  1.0 minutes Radiation Exposure Index (if provided by the fluoroscopic device): 10.4 Number of Acquired Spot Images: 0 COMPARISON:  06/10/2017 and chest CT from 05/08/2017 FINDINGS: The patient tolerated taking 3 small sips of water soluble contrast, a total of about 20 cc. After that she refused all further imaging. She would only allow imaging in the supine position hunched forward about 40 degrees by wedge pillows. On the swallows, the esophagus is noted to deviate laterally to the right before emptying into the very large intrathoracic hiatal hernia at projects over the central chest partially due to the patient positioning. There is some degree of esophageal dysmotility, as well as distal esophageal irregularity. Contrast surrounds the distal esophagus but is probably still within the very large hiatal hernia, which is distended with gas. I tilted the table up about 6 or 7 degrees, which is the most that the patient could tolerate without starting to slide. I was not able to get contrast to empty down into the intra- abdominal portion of the stomach. I am able to see a filling defect in the gastric bubble intra-abdominal portion compatible with the G-tube retention mechanism. IMPRESSION: 1. Today's exam does not exclude leak from the esophagus or stomach. The exam is simply too limited and the patient is  not a suitable candidate for fluoroscopic imaging of this type. Esophagram requires the patient's ability to turn a different angles and swallow more than a sip or two of contrast, and this patient's required positioning today, sitting up about 40 degrees with respect to the plane of imaging, is particularly unsuitable to fluoroscopic diagnosis. 2. From what I can tell on the limited assessment achievable, the esophagus is bunched over to the right and empties into a very large hiatal hernia containing at least most of the stomach. The contrast fills along the posterior wall of the hiatal hernia. The esophagus is irregular distally, and ulceration is not excluded. Chest CT might be an alternative although I am not certain that the patient will tolerate laying down for chest CT. Electronically Signed   By: Van Clines M.D.   On: 09/09/2017 11:25     STUDIES:  PORT CXR 10/4:  Personally reviewed by me. Patchy bilateral opacities that appear new. This is obscured by a large diaphragmatic hernia. Question dextrocardia from previous imaging. No definite pleural effusion appreciated.  MICROBIOLOGY: MRSA PCR 9/27:  Positive  Blood Cultures x2 10/4 >>>  ANTIBIOTICS: Ancef 10/3 (periop) Vancomycin 10/3 >>> Zosyn 10/4 >>>  SIGNIFICANT EVENTS: 10/03 - Admit w/ lap placement of gastrostomy tube & gastropexy with lysis of adhesions 10/04 - PCCM consult for hypotension post-op   LINES/TUBES: LUQ GASTROSTOMY TUBE 10/3 >>> PIV  ASSESSMENT / PLAN:  PULMONARY A: Acute hypoxic respiratory failure: Question atelectasis versus aspiration. Possible aspiration pneumonia versus pneumonitis  P:   Continuous pulse oximetry monitoring Weaning FiO2 See ID CTA chest pending  CARDIOVASCULAR A:  Shock:  Question sepsis versus cardiogenic in etiology.  P:  Bolus 2L IVF Starting Peripheral Neo-Synephrine low dose Continuous telemetry monitoring Vitals per unit protocol Goal MAP >65 Trending  troponin I every 6 hours Checking complete echocardiogram  RENAL A:   Lactic acidosis: Likely due to hypoperfusion.  P:   Trending lactic acid Monitoring urine output Trending renal function and electrolytes  GASTROINTESTINAL A:   Hiatal hernia:  S/P lap repair 10/3. Crohn's Disease  P:   NPO Postoperative care per surgery Plan for CT abdomen and pelvis without contrast at time of CT angiogram  of the chest  HEMATOLOGIC A:   Leukocytosis: Likely secondary to sepsis.  P:  Trending cell counts daily with CBC  INFECTIOUS A:   Possible sepsis: Aspiration pneumonia versus intra-abdominal source.  Possible aspiration pneumonia  P:   Blood cultures pending Agree with empiric vancomycin & Zosyn Trending Procalcitonin per algorithm Trending lactic acid   ENDOCRINE A:   Hyperglycemia:  No h/o DM.  Possible adrenal insufficiency    P:   Stat Cortisol Empiric Solu-Cortef 50 mg IV every 6 hours Accu-Cheks every 4 hours while nothing by mouth Sliding-scale insulin per sensitive algorithm Checking hemoglobin A1c  NEUROLOGIC A:   No acute issues.  P:   Monitoring closely in ICU  Prophylaxis:  Lovenox & SCDs. Changing Protonix to IV daily.  Diet:  NPO. Code Status:  Full Code per previous physician discussions. Confirmed at bedside with sister present. Disposition: Patient transitioned to ICU status. Family Update: Patient and sister updated at the time of my exam.  DISCUSSION:  71 y.o. female status post laparoscopic diaphragmatic hernia repair. Shock is possibly sepsis in etiology. Also suspicious for adrenal insufficiency. Patient would not tolerate laying flat therefore I am ordering an urgent PICC line consult. General Surgery APP at bedside and case discussed. Sources for sepsis include aspiration pneumonia as well as intra-abdominal source with recent surgery. CODE STATUS confirmed as full code.  I have spent a total of 32 minutes of critical care time today  caring for the patient and reviewing the patient's electronic medical record.   [name], M.D. [facility] Pulmonary & Critical Care Pager:  [phone] After 7pm or if no response, call [phone] 09/09/2017, 3:26 PM

## 2017-09-09 NOTE — Progress Notes (Signed)
Pt BP dropped to 74/58 pulse 109. WBC 35.4. Paged MD with this information he stated this was more than likely due to steroid use and pt is not septic. MD stated he will likely order steroids to be administered. Will continue to monitor and await on orders.

## 2017-09-09 NOTE — Progress Notes (Signed)
1 Day Post-Op   Subjective/Chief Complaint: Pt w/ some tachycardia and hypotension this AM.  She states that her BP is usually low. Some abd soreness   Objective: Vital signs in last 24 hours: Temp:  [97.3 F (36.3 C)-98.7 F (37.1 C)] 98.3 F (36.8 C) (10/04 0547) Pulse Rate:  [84-132] 109 (10/04 0730) Resp:  [7-22] 15 (10/04 0730) BP: (74-142)/(58-89) 74/58 (10/04 0730) SpO2:  [96 %-100 %] 98 % (10/04 0628) Weight:  [60.3 kg (133 lb)] 60.3 kg (133 lb) (10/03 0825) Last BM Date: 09/08/17  Intake/Output from previous day: 10/03 0701 - 10/04 0700 In: 3008.3 [I.V.:3008.3] Out: 1400 [Urine:1350; Blood:50] Intake/Output this shift: No intake/output data recorded.  General appearance: alert and cooperative Resp: clear to auscultation bilaterally Cardio: tachy, RR GI: soft, non-tender; bowel sounds normal; no masses,  no organomegaly and G tube in place  Lab Results:  No results for input(s): WBC, HGB, HCT, PLT in the last 72 hours. BMET  Recent Labs  09/09/17 0517  NA 135  K 4.1  CL 113*  CO2 14*  GLUCOSE 163*  BUN 20  CREATININE 1.32*  CALCIUM 8.1*   Anti-infectives: Anti-infectives    Start     Dose/Rate Route Frequency Ordered Stop   09/08/17 1000  vancomycin (VANCOCIN) IVPB 1000 mg/200 mL premix     1,000 mg 200 mL/hr over 60 Minutes Intravenous  Once 09/08/17 0946 09/08/17 1100   09/08/17 0950  ceFAZolin (ANCEF) 2-4 GM/100ML-% IVPB    Comments:  Bridget Hartshorn   : cabinet override      09/08/17 0950 09/08/17 1040   09/08/17 0947  vancomycin (VANCOCIN) 1-5 GM/200ML-% IVPB  Status:  Discontinued    Comments:  Bridget Hartshorn   : cabinet override      09/08/17 0947 09/08/17 0956   09/08/17 0941  vancomycin (VANCOCIN) 1-5 GM/200ML-% IVPB    Comments:  Dellie Catholic   : cabinet override      09/08/17 0941 09/08/17 1000   09/08/17 0810  ceFAZolin (ANCEF) IVPB 2g/100 mL premix     2 g 200 mL/hr over 30 Minutes Intravenous On call to O.R. 09/08/17 0811  09/08/17 1050      Assessment/Plan: s/p Procedure(s): DIAGNOSTIC LAPAROSCOPIC PLACEMENT OF gastrostomy TUBE AND GASTROPEXY (N/A) LAPAROSCOPIC LYSIS OF ADHESIONS (N/A) Pt was given dilaudid this AM , and may be a cause of her hypotension considering her increase in Cr. -Will repeat CBC this AM and pCXR -con't bolus -Consult ostomy RN for [employer] education  LOS: 0 days    [name]., Anne Hahn 09/09/2017

## 2017-09-10 ENCOUNTER — Inpatient Hospital Stay (HOSPITAL_COMMUNITY): Payer: Medicare HMO

## 2017-09-10 ENCOUNTER — Encounter (HOSPITAL_COMMUNITY): Payer: Self-pay | Admitting: Surgery

## 2017-09-10 ENCOUNTER — Encounter (HOSPITAL_COMMUNITY)
Admission: AD | Disposition: A | Discharge status: Home Health | Payer: Self-pay | Source: Home / Self Care | Attending: General Surgery

## 2017-09-10 ENCOUNTER — Inpatient Hospital Stay (HOSPITAL_COMMUNITY): Payer: Medicare HMO | Admitting: Certified Registered Nurse Anesthetist

## 2017-09-10 DIAGNOSIS — R6521 Severe sepsis with septic shock: Secondary | ICD-10-CM

## 2017-09-10 DIAGNOSIS — A419 Sepsis, unspecified organism: Secondary | ICD-10-CM

## 2017-09-10 HISTORY — PX: LAPAROTOMY: SHX154

## 2017-09-10 LAB — BLOOD GAS, ARTERIAL
ACID-BASE DEFICIT: 11.2 mmol/L — AB (ref 0.0–2.0)
Bicarbonate: 14.3 mmol/L — ABNORMAL LOW (ref 20.0–28.0)
DRAWN BY: 441261
FIO2: 60
LHR: 24 {breaths}/min
MECHVT: 450 mL
O2 SAT: 97.4 %
PATIENT TEMPERATURE: 37
PEEP/CPAP: 8 cmH2O
PH ART: 7.268 — AB (ref 7.350–7.450)
PO2 ART: 123 mmHg — AB (ref 83.0–108.0)
pCO2 arterial: 32.4 mmHg (ref 32.0–48.0)

## 2017-09-10 LAB — COMPREHENSIVE METABOLIC PANEL
ALK PHOS: 91 U/L (ref 38–126)
ALT: 21 U/L (ref 14–54)
ANION GAP: 10 (ref 5–15)
AST: 48 U/L — ABNORMAL HIGH (ref 15–41)
Albumin: 2.4 g/dL — ABNORMAL LOW (ref 3.5–5.0)
BUN: 33 mg/dL — ABNORMAL HIGH (ref 6–20)
CHLORIDE: 118 mmol/L — AB (ref 101–111)
CO2: 11 mmol/L — AB (ref 22–32)
CREATININE: 2.98 mg/dL — AB (ref 0.44–1.00)
Calcium: 6.9 mg/dL — ABNORMAL LOW (ref 8.9–10.3)
GFR calc non Af Amer: 15 mL/min — ABNORMAL LOW (ref >60)
GFR, EST AFRICAN AMERICAN: 17 mL/min — AB (ref >60)
GLUCOSE: 93 mg/dL (ref 65–99)
Potassium: 4.3 mmol/L (ref 3.5–5.1)
SODIUM: 139 mmol/L (ref 135–145)
Total Bilirubin: 0.6 mg/dL (ref 0.3–1.2)
Total Protein: 5.9 g/dL — ABNORMAL LOW (ref 6.5–8.1)

## 2017-09-10 LAB — GLUCOSE, CAPILLARY
GLUCOSE-CAPILLARY: 95 mg/dL (ref 65–99)
GLUCOSE-CAPILLARY: 87 mg/dL (ref 65–99)
GLUCOSE-CAPILLARY: 68 mg/dL (ref 65–99)
GLUCOSE-CAPILLARY: 109 mg/dL — AB (ref 65–99)
Glucose-Capillary: 81 mg/dL (ref 65–99)
Glucose-Capillary: 98 mg/dL (ref 65–99)
Glucose-Capillary: 181 mg/dL — ABNORMAL HIGH (ref 65–99)

## 2017-09-10 LAB — CREATININE, SERUM
Creatinine, Ser: 2.87 mg/dL — ABNORMAL HIGH (ref 0.44–1.00)
GFR calc non Af Amer: 16 mL/min — ABNORMAL LOW (ref >60)
GFR, EST AFRICAN AMERICAN: 18 mL/min — AB (ref >60)

## 2017-09-10 LAB — POCT I-STAT 7, (LYTES, BLD GAS, ICA,H+H)
Acid-base deficit: 20 mmol/L — ABNORMAL HIGH (ref 0.0–2.0)
Acid-base deficit: 13 mmol/L — ABNORMAL HIGH (ref 0.0–2.0)
Bicarbonate: 9.7 mmol/L — ABNORMAL LOW (ref 20.0–28.0)
Bicarbonate: 13.8 mmol/L — ABNORMAL LOW (ref 20.0–28.0)
CALCIUM ION: 0.96 mmol/L — AB (ref 1.15–1.40)
Calcium, Ion: 1.06 mmol/L — ABNORMAL LOW (ref 1.15–1.40)
HEMATOCRIT: 24 % — AB (ref 36.0–46.0)
HEMATOCRIT: 23 % — AB (ref 36.0–46.0)
HEMOGLOBIN: 8.2 g/dL — AB (ref 12.0–15.0)
Hemoglobin: 7.8 g/dL — ABNORMAL LOW (ref 12.0–15.0)
O2 Saturation: 99 %
O2 Saturation: 99 %
PCO2 ART: 34.2 mmHg (ref 32.0–48.0)
PH ART: 7.038 — AB (ref 7.350–7.450)
PO2 ART: 183 mmHg — AB (ref 83.0–108.0)
POTASSIUM: 3.7 mmol/L (ref 3.5–5.1)
Patient temperature: 36.7
Potassium: 4.4 mmol/L (ref 3.5–5.1)
SODIUM: 140 mmol/L (ref 135–145)
SODIUM: 144 mmol/L (ref 135–145)
TCO2: 11 mmol/L — AB (ref 22–32)
TCO2: 15 mmol/L — ABNORMAL LOW (ref 22–32)
pCO2 arterial: 35.9 mmHg (ref 32.0–48.0)
pH, Arterial: 7.212 — ABNORMAL LOW (ref 7.350–7.450)
pO2, Arterial: 184 mmHg — ABNORMAL HIGH (ref 83.0–108.0)

## 2017-09-10 LAB — COOXEMETRY PANEL
Carboxyhemoglobin: 0.6 % (ref 0.5–1.5)
Methemoglobin: 1.2 % (ref 0.0–1.5)
O2 Saturation: 73.9 %
TOTAL HEMOGLOBIN: 10.4 g/dL — AB (ref 12.0–16.0)

## 2017-09-10 LAB — BLOOD GAS, VENOUS
Acid-base deficit: 9.9 mmol/L — ABNORMAL HIGH (ref 0.0–2.0)
BICARBONATE: 16 mmol/L — AB (ref 20.0–28.0)
O2 SAT: 76 %
PATIENT TEMPERATURE: 37
PCO2 VEN: 36.9 mmHg — AB (ref 44.0–60.0)
PH VEN: 7.259 (ref 7.250–7.430)
PO2 VEN: 43.2 mmHg (ref 32.0–45.0)

## 2017-09-10 LAB — CBC WITH DIFFERENTIAL/PLATELET
BASOS PCT: 0 %
Basophils Absolute: 0 10*3/uL (ref 0.0–0.1)
EOS PCT: 0 %
Eosinophils Absolute: 0 10*3/uL (ref 0.0–0.7)
HCT: 30.9 % — ABNORMAL LOW (ref 36.0–46.0)
HEMOGLOBIN: 10.1 g/dL — AB (ref 12.0–15.0)
LYMPHS PCT: 3 %
Lymphs Abs: 1.5 10*3/uL (ref 0.7–4.0)
MCH: 29.4 pg (ref 26.0–34.0)
MCHC: 32.7 g/dL (ref 30.0–36.0)
MCV: 90.1 fL (ref 78.0–100.0)
Monocytes Absolute: 3 10*3/uL — ABNORMAL HIGH (ref 0.1–1.0)
Monocytes Relative: 6 %
NEUTROS ABS: 45.9 10*3/uL — AB (ref 1.7–7.7)
NEUTROS PCT: 91 %
Platelets: 209 10*3/uL (ref 150–400)
RBC: 3.43 MIL/uL — ABNORMAL LOW (ref 3.87–5.11)
RDW: 16.4 % — AB (ref 11.5–15.5)
WBC: 50.4 10*3/uL (ref 4.0–10.5)

## 2017-09-10 LAB — MAGNESIUM: MAGNESIUM: 1.1 mg/dL — AB (ref 1.7–2.4)

## 2017-09-10 LAB — TROPONIN I: Troponin I: <0.03 ng/mL (ref <0.03)

## 2017-09-10 LAB — PHOSPHORUS: PHOSPHORUS: 6.1 mg/dL — AB (ref 2.5–4.6)

## 2017-09-10 LAB — PROCALCITONIN: Procalcitonin: 57.49 ng/mL

## 2017-09-10 SURGERY — LAPAROTOMY, EXPLORATORY
Anesthesia: General

## 2017-09-10 MED ORDER — LINEZOLID 600 MG/300ML IV SOLN
600.0000 mg | Freq: Two times a day (BID) | INTRAVENOUS | Status: DC
  Filled 2017-09-10 (×2): qty 300

## 2017-09-10 MED ORDER — LACTATED RINGERS IV SOLN
INTRAVENOUS | Status: DC | PRN
Start: 2017-09-10 — End: 2017-09-10
  Administered 2017-09-10: 11:00:00 via INTRAVENOUS

## 2017-09-10 MED ORDER — LINEZOLID 600 MG/300ML IV SOLN
600.0000 mg | Freq: Two times a day (BID) | INTRAVENOUS | Status: DC
  Administered 2017-09-10 – 2017-09-16 (×13): 600 mg via INTRAVENOUS
  Filled 2017-09-10 (×14): qty 300

## 2017-09-10 MED ORDER — SODIUM BICARBONATE 8.4 % IV SOLN
INTRAVENOUS | Status: AC
  Filled 2017-09-10: qty 50

## 2017-09-10 MED ORDER — SODIUM CHLORIDE 0.9 % IJ SOLN
INTRAMUSCULAR | Status: AC
  Filled 2017-09-10: qty 10

## 2017-09-10 MED ORDER — SODIUM BICARBONATE 4 % IV SOLN
INTRAVENOUS | Status: AC
  Filled 2017-09-10: qty 15

## 2017-09-10 MED ORDER — FENTANYL CITRATE (PF) 100 MCG/2ML IJ SOLN
INTRAMUSCULAR | Status: DC | PRN
  Administered 2017-09-10 (×3): 25 ug via INTRAVENOUS

## 2017-09-10 MED ORDER — DEXMEDETOMIDINE HCL IN NACL 200 MCG/50ML IV SOLN
INTRAVENOUS | Status: DC | PRN
  Administered 2017-09-10: 0.4 ug/kg/h via INTRAVENOUS

## 2017-09-10 MED ORDER — FENTANYL BOLUS VIA INFUSION
25.0000 ug | INTRAVENOUS | Status: DC | PRN
  Administered 2017-09-10 (×3): 25 ug via INTRAVENOUS
  Administered 2017-09-11: 50 ug via INTRAVENOUS
  Administered 2017-09-11: 25 ug via INTRAVENOUS
  Filled 2017-09-10: qty 50

## 2017-09-10 MED ORDER — SODIUM BICARBONATE 8.4 % IV SOLN
INTRAVENOUS | Status: DC | PRN
  Administered 2017-09-10 (×4): 50 mL via INTRAVENOUS

## 2017-09-10 MED ORDER — SODIUM CHLORIDE 0.9 % IV BOLUS (SEPSIS)
1000.0000 mL | Freq: Once | INTRAVENOUS | Status: AC
  Administered 2017-09-10: 1000 mL via INTRAVENOUS

## 2017-09-10 MED ORDER — ONDANSETRON HCL 4 MG/2ML IJ SOLN
INTRAMUSCULAR | Status: AC
  Filled 2017-09-10: qty 2

## 2017-09-10 MED ORDER — DEXTROSE 50 % IV SOLN
INTRAVENOUS | Status: AC
  Administered 2017-09-10: 50 mL via INTRAVENOUS
  Filled 2017-09-10: qty 50

## 2017-09-10 MED ORDER — LACTATED RINGERS IV SOLN
INTRAVENOUS | Status: DC | PRN
  Administered 2017-09-10 (×3): via INTRAVENOUS

## 2017-09-10 MED ORDER — ROCURONIUM BROMIDE 50 MG/5ML IV SOSY
PREFILLED_SYRINGE | INTRAVENOUS | Status: AC
Start: 2017-09-10 — End: ?
  Filled 2017-09-10: qty 5

## 2017-09-10 MED ORDER — DEXTROSE 50 % IV SOLN
50.0000 mL | Freq: Once | INTRAVENOUS | Status: AC
  Administered 2017-09-10: 50 mL via INTRAVENOUS

## 2017-09-10 MED ORDER — SODIUM CHLORIDE 0.9 % IJ SOLN
INTRAMUSCULAR | Status: AC
  Filled 2017-09-10: qty 50

## 2017-09-10 MED ORDER — NOREPINEPHRINE BITARTRATE 1 MG/ML IV SOLN
0.0000 ug/min | INTRAVENOUS | Status: DC
  Administered 2017-09-10: 4 ug/min via INTRAVENOUS
  Administered 2017-09-11: 12 ug/min via INTRAVENOUS
  Administered 2017-09-11: 26 ug/min via INTRAVENOUS
  Filled 2017-09-10 (×3): qty 16

## 2017-09-10 MED ORDER — PHENYLEPHRINE 40 MCG/ML (10ML) SYRINGE FOR IV PUSH (FOR BLOOD PRESSURE SUPPORT)
PREFILLED_SYRINGE | INTRAVENOUS | Status: AC
  Filled 2017-09-10: qty 20

## 2017-09-10 MED ORDER — BUPIVACAINE-EPINEPHRINE (PF) 0.25% -1:200000 IJ SOLN
INTRAMUSCULAR | Status: AC
  Filled 2017-09-10: qty 30

## 2017-09-10 MED ORDER — SUCCINYLCHOLINE CHLORIDE 200 MG/10ML IV SOSY
PREFILLED_SYRINGE | INTRAVENOUS | Status: DC | PRN
  Administered 2017-09-10: 140 mg via INTRAVENOUS

## 2017-09-10 MED ORDER — SODIUM CHLORIDE 0.9 % IV SOLN
200.0000 mg | Freq: Once | INTRAVENOUS | Status: AC
  Administered 2017-09-10: 200 mg via INTRAVENOUS
  Filled 2017-09-10: qty 200

## 2017-09-10 MED ORDER — ROCURONIUM BROMIDE 50 MG/5ML IV SOSY
PREFILLED_SYRINGE | INTRAVENOUS | Status: AC
  Filled 2017-09-10: qty 5

## 2017-09-10 MED ORDER — CHLORHEXIDINE GLUCONATE 0.12% ORAL RINSE (MEDLINE KIT)
15.0000 mL | Freq: Two times a day (BID) | OROMUCOSAL | Status: DC
  Administered 2017-09-10 – 2017-09-25 (×24): 15 mL via OROMUCOSAL

## 2017-09-10 MED ORDER — DEXMEDETOMIDINE HCL IN NACL 200 MCG/50ML IV SOLN
INTRAVENOUS | Status: AC
  Filled 2017-09-10: qty 50

## 2017-09-10 MED ORDER — PHENYLEPHRINE HCL 10 MG/ML IJ SOLN
INTRAMUSCULAR | Status: AC
  Filled 2017-09-10: qty 1

## 2017-09-10 MED ORDER — HEPARIN SODIUM (PORCINE) 5000 UNIT/ML IJ SOLN
5000.0000 [IU] | Freq: Three times a day (TID) | INTRAMUSCULAR | Status: DC
  Administered 2017-09-10 – 2017-09-16 (×18): 5000 [IU] via SUBCUTANEOUS
  Filled 2017-09-10 (×17): qty 1

## 2017-09-10 MED ORDER — SUCCINYLCHOLINE CHLORIDE 200 MG/10ML IV SOSY
PREFILLED_SYRINGE | INTRAVENOUS | Status: AC
  Filled 2017-09-10: qty 10

## 2017-09-10 MED ORDER — FENTANYL CITRATE (PF) 100 MCG/2ML IJ SOLN
25.0000 ug | INTRAMUSCULAR | Status: DC | PRN
  Filled 2017-09-10: qty 2

## 2017-09-10 MED ORDER — LIDOCAINE 2% (20 MG/ML) 5 ML SYRINGE
INTRAMUSCULAR | Status: AC
Start: 2017-09-10 — End: ?
  Filled 2017-09-10: qty 5

## 2017-09-10 MED ORDER — PHENYLEPHRINE HCL 10 MG/ML IJ SOLN
INTRAMUSCULAR | Status: AC
Start: 2017-09-10 — End: ?
  Filled 2017-09-10: qty 1

## 2017-09-10 MED ORDER — 0.9 % SODIUM CHLORIDE (POUR BTL) OPTIME
TOPICAL | Status: DC | PRN
  Administered 2017-09-10: 5000 mL

## 2017-09-10 MED ORDER — EPHEDRINE 5 MG/ML INJ
INTRAVENOUS | Status: AC
  Filled 2017-09-10: qty 10

## 2017-09-10 MED ORDER — PROPOFOL 10 MG/ML IV BOLUS
INTRAVENOUS | Status: AC
Start: 2017-09-10 — End: ?
  Filled 2017-09-10: qty 20

## 2017-09-10 MED ORDER — ORAL CARE MOUTH RINSE
15.0000 mL | Freq: Four times a day (QID) | OROMUCOSAL | Status: DC
  Administered 2017-09-10 – 2017-09-25 (×35): 15 mL via OROMUCOSAL

## 2017-09-10 MED ORDER — PHENYLEPHRINE 40 MCG/ML (10ML) SYRINGE FOR IV PUSH (FOR BLOOD PRESSURE SUPPORT)
PREFILLED_SYRINGE | INTRAVENOUS | Status: DC | PRN
  Administered 2017-09-10: 120 ug via INTRAVENOUS

## 2017-09-10 MED ORDER — NOREPINEPHRINE BITARTRATE 1 MG/ML IV SOLN
0.0000 ug/min | INTRAVENOUS | Status: DC
  Administered 2017-09-10: 5.33 ug/min via INTRAVENOUS
  Filled 2017-09-10: qty 4

## 2017-09-10 MED ORDER — SUGAMMADEX SODIUM 200 MG/2ML IV SOLN
INTRAVENOUS | Status: AC
  Filled 2017-09-10: qty 2

## 2017-09-10 MED ORDER — SODIUM CHLORIDE 0.9 % IV SOLN
100.0000 mg | INTRAVENOUS | Status: DC
  Administered 2017-09-11 – 2017-09-26 (×16): 100 mg via INTRAVENOUS
  Filled 2017-09-10 (×16): qty 100

## 2017-09-10 MED ORDER — MIDAZOLAM HCL 2 MG/2ML IJ SOLN: 1.0000 mg | INTRAMUSCULAR | Status: DC | PRN

## 2017-09-10 MED ORDER — ROCURONIUM BROMIDE 50 MG/5ML IV SOSY
PREFILLED_SYRINGE | INTRAVENOUS | Status: DC | PRN
  Administered 2017-09-10: 40 mg via INTRAVENOUS
  Administered 2017-09-10 (×2): 10 mg via INTRAVENOUS

## 2017-09-10 MED ORDER — SODIUM CHLORIDE 0.9 % IV SOLN
25.0000 ug/h | INTRAVENOUS | Status: DC
  Filled 2017-09-10: qty 50

## 2017-09-10 MED ORDER — ETOMIDATE 2 MG/ML IV SOLN
INTRAVENOUS | Status: DC | PRN
Start: 2017-09-10 — End: 2017-09-10
  Administered 2017-09-10: 10 mg via INTRAVENOUS

## 2017-09-10 MED ORDER — FENTANYL CITRATE (PF) 250 MCG/5ML IJ SOLN
INTRAMUSCULAR | Status: AC
  Filled 2017-09-10: qty 5

## 2017-09-10 MED ORDER — PIPERACILLIN-TAZOBACTAM IN DEX 2-0.25 GM/50ML IV SOLN
2.2500 g | Freq: Four times a day (QID) | INTRAVENOUS | Status: DC
  Administered 2017-09-11 – 2017-09-13 (×9): 2.25 g via INTRAVENOUS
  Filled 2017-09-10 (×11): qty 50

## 2017-09-10 MED ORDER — LIDOCAINE 2% (20 MG/ML) 5 ML SYRINGE
INTRAMUSCULAR | Status: DC | PRN
  Administered 2017-09-10: 60 mg via INTRAVENOUS

## 2017-09-10 SURGICAL SUPPLY — 46 items
BNDG GAUZE ELAST 4 BULKY (GAUZE/BANDAGES/DRESSINGS) ×2 IMPLANT
CABLE HIGH FREQUENCY MONO STRZ (ELECTRODE) ×3 IMPLANT
COVER SURGICAL LIGHT HANDLE (MISCELLANEOUS) ×6 IMPLANT
DECANTER SPIKE VIAL GLASS SM (MISCELLANEOUS) ×3 IMPLANT
DRAIN CHANNEL 19F RND (DRAIN) ×4 IMPLANT
ELECT PENCIL ROCKER SW 15FT (MISCELLANEOUS) ×3 IMPLANT
ELECT REM PT RETURN 15FT ADLT (MISCELLANEOUS) ×3 IMPLANT
ENDOLOOP SUT PDS II  0 18 (SUTURE)
ENDOLOOP SUT PDS II 0 18 (SUTURE) IMPLANT
GAUZE SPONGE 4X4 12PLY STRL (GAUZE/BANDAGES/DRESSINGS) ×4 IMPLANT
GLOVE BIO SURGEON STRL SZ 6 (GLOVE) ×6 IMPLANT
GLOVE INDICATOR 6.5 STRL GRN (GLOVE) ×6 IMPLANT
GOWN STRL REUS W/TWL LRG LVL3 (GOWN DISPOSABLE) ×6 IMPLANT
GOWN STRL REUS W/TWL XL LVL3 (GOWN DISPOSABLE) ×6 IMPLANT
HEMOSTAT SNOW SURGICEL 2X4 (HEMOSTASIS) ×2 IMPLANT
PACK COLON (CUSTOM PROCEDURE TRAY) ×3 IMPLANT
PAD ABD 8X10 STRL (GAUZE/BANDAGES/DRESSINGS) ×4 IMPLANT
PAD POSITIONING PINK XL (MISCELLANEOUS) IMPLANT
SCISSORS LAP 5X35 DISP (ENDOMECHANICALS) ×3 IMPLANT
SET IRRIG TUBING LAPAROSCOPIC (IRRIGATION / IRRIGATOR) ×3 IMPLANT
SHEARS HARMONIC ACE PLUS 36CM (ENDOMECHANICALS) ×3 IMPLANT
SLEEVE XCEL OPT CAN 5 100 (ENDOMECHANICALS) ×6 IMPLANT
SPONGE DRAIN TRACH 4X4 STRL 2S (GAUZE/BANDAGES/DRESSINGS) ×4 IMPLANT
STAPLER GUN LINEAR PROX 60 (STAPLE) ×2 IMPLANT
SUT CHROMIC 0 BP (SUTURE) ×4 IMPLANT
SUT ETHILON 3 0 PS 1 (SUTURE) ×4 IMPLANT
SUT PDS AB 0 CT1 36 (SUTURE) IMPLANT
SUT PDS AB 1 CTX 36 (SUTURE) IMPLANT
SUT PDS AB 1 TP1 96 (SUTURE) ×4 IMPLANT
SUT SILK 0 (SUTURE) ×3
SUT SILK 0 30XBRD TIE 6 (SUTURE) IMPLANT
SUT SILK 0 CT 1 30 (SUTURE) ×2 IMPLANT
SUT SILK 2 0 (SUTURE) ×3
SUT SILK 2 0 SH CR/8 (SUTURE) ×5 IMPLANT
SUT SILK 2-0 18XBRD TIE 12 (SUTURE) ×1 IMPLANT
SUT SILK 3 0 (SUTURE) ×3
SUT SILK 3 0 SH CR/8 (SUTURE) ×5 IMPLANT
SUT SILK 3-0 18XBRD TIE 12 (SUTURE) ×1 IMPLANT
SUT VIC AB 3-0 SH 27 (SUTURE)
SUT VIC AB 3-0 SH 27XBRD (SUTURE) IMPLANT
TOWEL OR NON WOVEN STRL DISP B (DISPOSABLE) ×3 IMPLANT
TRAY FOLEY CATH 14FRSI W/METER (CATHETERS) ×2 IMPLANT
TROCAR BLADELESS OPT 5 100 (ENDOMECHANICALS) ×3 IMPLANT
TUBING CONNECTING 10 (TUBING) ×4 IMPLANT
TUBING CONNECTING 10' (TUBING) ×2
TUBING INSUFFLATOR HI FLOW (MISCELLANEOUS) ×3 IMPLANT

## 2017-09-10 NOTE — Anesthesia Postprocedure Evaluation (Signed)
Anesthesia Post Note  Patient: Danielle [name]  Procedure(s) Performed: Exploratory laparotomy with takedown of G tube, repair gastrostomy abdominal washout (N/A )     Patient location during evaluation: PACU Anesthesia Type: General Level of consciousness: sedated and patient remains intubated per anesthesia plan Vital Signs Assessment: post-procedure vital signs reviewed and stable Respiratory status: patient remains intubated per anesthesia plan and patient on ventilator - see flowsheet for VS Cardiovascular status: blood pressure returned to baseline Anesthetic complications: no    Last Vitals:  Vitals:   09/10/17 1600 09/10/17 1643  BP:  (!) 103/52  Pulse:  (!) 128  Resp:  (!) 24  Temp: 36.6 C   SpO2:  97%    Last Pain:  Vitals:   09/10/17 1600  TempSrc: Axillary  PainSc:                  Howard Patton COKER

## 2017-09-10 NOTE — Progress Notes (Signed)
PCCM Attending Re-Rounding Note:  Reevaluated at bedside postoperatively. Patient evidently had pus per report from surgeon performing exploratory laparotomy. Received bolus bicarbonate in the OR. ABG reviewed and acidotic. Continuing full ventilator support. Given septic shock I am adding Eraxis for antifungal coverage. Increasing positive end expiratory pressure for Coumadin & increasing respiratory rate onset ventilator. Plan for repeat ABG in 1 hour. Checking stat portable chest x-ray to evaluate tube positions. Holding on bicarbonate drip for now. Ordering fentanyl infusion for pain control. Ventilator & VAP orders placed.  I have spent an additional 12 minutes of critical care time today caring for this patient postoperatively.  [name], M.D. [facility] Pulmonary & Critical Care Pager:  [phone] After 7pm or if no response, call [phone] 1:08 PM 09/10/17

## 2017-09-10 NOTE — Progress Notes (Signed)
Chart reviewed, patient is sent back to OR for urgent diagnostic laparoscopy and she is started on pressors,  hospitalist will sign off, critical care following. Please do not hesitate to call us back if help needed.

## 2017-09-10 NOTE — Progress Notes (Signed)
PCCM Attending Re-Rounding Note:  Patient re-evaluated. Pupils equal, symmetric & reactive. Patient awakens with voice and responds with appropriate head nods. Denies any pain or difficulty breathing. Following commands bilaterally & grossly nonfocal. Appears comfortable. Remains on vasopressor infusion. Continuing current plan of care.  [name], M.D. [facility] Pulmonary & Critical Care Pager:  [phone] After 7pm or if no response, call [phone] 6:13 PM 09/10/17

## 2017-09-10 NOTE — Progress Notes (Signed)
Date:  September 10, 2017 Chart reviewed for concurrent status and case management needs.  Will continue to follow patient progress.  Discharge Planning: following for needs  Expected discharge date: [id number]  Rhonda Davis, BSN, RN3, CCM   [phone]  

## 2017-09-10 NOTE — Progress Notes (Deleted)
CRITICAL VALUE ALERT  Critical Value:  Vancomycin Tr 24  Date & Time Notied:  09/10/2017 1718H   Provider Notified: Tera Partridge, MD  Orders Received/Actions taken: Vancomycin D/C; monitoring kidney function. Repeat labs in AM.  Will continue to monitor.  Mervyn Skeeters, RN

## 2017-09-10 NOTE — Progress Notes (Addendum)
Patients heart rate dropped to 50s and blood pressure shot up to 180s. MD paged and Levophed paused. 12 lead EKG performed. Code cart outside of room. Pulse returned to 120s and Blood trended back down to 80s and Levophed restarted. RN to continue to monitor.

## 2017-09-10 NOTE — Op Note (Signed)
Operative Note  KARYME MCCONATHY  [id number]  [id number]  09/10/2017   Surgeon: Victorino Sparrow ConnorMD  Assistant: Armandina Gemma MD  Procedure performed: exploratory laparotomy, takedown of gastrostomy and repair of gastrotomy, washout and placement of mediastinal drains, hepatorrhaphy x 2  Preop diagnosis: septic shock Post-op diagnosis/intraop findings: moderate volume pneumoperitoneum and purulent peritonitis, possible leakage at g tube site  Specimens: no Retained items: 61fr round blake drains x 2. The drain exiting the right hemiabdomen courses up into the mediastinum. The drain exiting the left hemiabdomen also goes into the mediastinum with the proximal end lying over the gastric staple line.  EBL: 01BP Complications: none  Description of procedure: After obtaining informed consent the patient was taken to the operating room and placed supine on operating room table wheregeneral endotracheal anesthesia was initiated, preoperative antibiotics were administered, SCDs applied, and a formal timeout was performed. An arterial line and a central line were placed by Dr. Linna Caprice. The abdomen was prepped and draped in the usual sterile fashion. Peritoneal access was gained initially using a visiport technique in the right upper quadrant. Purulent fluid and air immediately drained from the trocar. I elected to make an upper midline laparotomy. This was done with sharp and cautery dissection and the prior prolene sutures were removed as able. A moderate amount of purulent fluid was encountered in the upper abdomen and mediastinum and this was aspirated. The gastropexy was taken down. A singular adhesion of small bowel in the pelvis was taken down sharply to prevent future obstruction.With manipulation there was gastric contents leaking from the g tube site and this seemed to be the source of leak. The gastrostomy tube was removed and the stomach taken down from the abdominal wall. The g tube site and  immediately adjacent stomach wall appeared dusky. The gastrotomy was closed and the dusky tissue excised using a TA blue load stapler. The abdomen was copiously irrigated and the effluent was clear. The mediastinum was also irrigated and lavaged.  Retraction on the liver for this purpose did induce a small tear with some bleeding at the entry of the falciform. This was addressed with two chromic sutures to compress the liver parenchyma with good effect. Emogene Morgan was placed over this area and the falciform directed to sit atop the snow.The proximal stomach remains in the mediastinum and this really is not able to be retracted  into the abdomen. The CRNA was able to introduce an NG tube which passed easily into the stomach and placement was confirmed in the antrum by palpation, this sits just at the level of the gastric staple line. This was placed to suction. We inspected the abdomen once more and there was no other source of leak identified. Two 19 french round blake drains were introduced and placed into the mediastinum. The left sided drain also sits over the gastric staple line. These were secured at the skin with 3-0 nylons. The abdomen was then closed with running looped #1 PDS. 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 The skin was left open and packed with a wet to dry dressing. Drains to bulb suction.  The patient will return to the ICU intubated in critical condition as she is severely acidotic and required pressors throughout the case.   All counts were correct at the completion of the case.

## 2017-09-10 NOTE — Progress Notes (Signed)
Patient unable to sign consent due to altered level of consciousness. Patients next of Kin, Louretta Parma, called and consent verified by two RN'S. Patients sister, Ms. Rollene Rotunda, spoke with the surgeon, and the critical care MD.

## 2017-09-10 NOTE — Progress Notes (Signed)
PCCM Attending Re-Rounding Note:  Patient re-evaluated because RN reports that patient had bradycardic and acute hypertensive episode. On my evaluation the patient is spontaneously moving all 4 extremities. Pupils are symmetric, round, and equally reactive. BP has now normalized and patient still on vasopressor support. Continuing close monitoring.  [name], M.D. [facility] Pulmonary & Critical Care Pager:  [phone] After 7pm or if no response, call [phone] 4:09 PM 09/10/17

## 2017-09-10 NOTE — Anesthesia Procedure Notes (Signed)
Arterial Line Insertion Start/End10/04/2017 11:15 AM, 09/10/2017 11:20 AM Performed by: Linna Caprice Abigal Choung, anesthesiologist  Patient location: OR. Preanesthetic checklist: patient identified, IV checked, site marked, risks and benefits discussed, surgical consent, monitors and equipment checked, pre-op evaluation and timeout performed Left, femoral was placed  Attempts: 1 Procedure performed without using ultrasound guided technique. Ultrasound Notes:anatomy identified, needle tip was noted to be adjacent to the nerve/plexus identified and no ultrasound evidence of intravascular and/or intraneural injection Following insertion, dressing applied, line sutured and Biopatch. Patient tolerated the procedure well with no immediate complications.

## 2017-09-10 NOTE — Progress Notes (Signed)
S: Remained tachycardic with low BP overnight. Neo was just started around 7am today and has increased from 20--> 40. Patient states her abdominal pain is about the same as yesterday.   Vitals, labs, intake/output, and orders reviewed at this time. Tmax 98.6. Hr 109-136. BP 76/48. Sats 100% on 4L .  UOP was recored as 1x void yesterday. Procalcitonin uptrending 57.49. Serial troponins negative. This morning's labs are not drawn yet. Creatinine up 2.87. IV fluids have been decreased to 10 because of concern for pulmonary edema.  Gen: Somnolent awakens to voice, answers yes no questions only Chest: unlabored respirations, tachycardic, regular Abd: soft, diffusely tender, mildly distended, incisions c/d/i with dermabond Ext: warm, no edema  Lines/tubes/drains: PICC  A/P:  POD 2 laparoscopic lysis of adhesions and placement of g tube/ gastropexy for large hiatal hernia. Ongoing and progressive hypotension and tachycardia. Elevated WBC and procalcitonin. CT overnight with no evidence of PE, large amount of fluid and air in mediastinum.  -Place G tube to gravity -Needs foley for UOP monitoring, will place in OR -Plan return to OR this morning for diagnostic laparoscopy, possible laparotomy, upper endoscopy to rule out leak; likely washout and drain placement in mediastinum -I have discussed the plan with her sister as patient is not able to sign her own consent this morning.    Romana Juniper, MD [facility] Surgery, [place] Pager [phone]

## 2017-09-10 NOTE — Transfer of Care (Signed)
Immediate Anesthesia Transfer of Care Note  Patient: Danielle [name]  Procedure(s) Performed: Exploratory laparotomy with takedown of G tube, repair gastrostomy abdominal washout (N/A )  Patient Location: ICU  Anesthesia Type:General  Level of Consciousness: sedated and Patient remains intubated per anesthesia plan  Airway & Oxygen Therapy: Patient remains intubated per anesthesia plan  Post-op Assessment: Post -op Vital signs reviewed and stable  Post vital signs: stable  Last Vitals:  Vitals:   09/10/17 0943 09/10/17 1253  BP: (!) 89/54 (!) 117/49  Pulse: (!) 125   Resp: 20 (!) 24  Temp:    SpO2: (!) 77% 95%    Last Pain:  Vitals:   09/10/17 0800  TempSrc: Axillary  PainSc:       Patients Stated Pain Goal: 2 (50/53/97 6734)  Complications: No apparent anesthesia complications

## 2017-09-10 NOTE — Anesthesia Procedure Notes (Signed)
Central Venous Catheter Insertion Performed by: Roberts Gaudy, anesthesiologist Start/End10/04/2017 11:20 AM, 09/10/2017 11:25 AM Patient location: OR. Preanesthetic checklist: patient identified, IV checked, site marked, risks and benefits discussed, surgical consent, monitors and equipment checked, pre-op evaluation and timeout performed Position: Trendelenburg Hand hygiene performed , maximum sterile barriers used  and Seldinger technique used Catheter size: 8 Fr Central line was placed.Double lumen Procedure performed using ultrasound guided technique. Attempts: 1 Following insertion, line sutured, dressing applied and Biopatch. Post procedure assessment: blood return through all ports, free fluid flow and no air  Patient tolerated the procedure well with no immediate complications.

## 2017-09-10 NOTE — Anesthesia Procedure Notes (Signed)
Procedure Name: Intubation Date/Time: 09/10/2017 10:27 AM Performed by: Montel Clock Pre-anesthesia Checklist: Patient identified, Emergency Drugs available, Suction available, Patient being monitored and Timeout performed Patient Re-evaluated:Patient Re-evaluated prior to induction Oxygen Delivery Method: Circle system utilized Preoxygenation: Pre-oxygenation with 100% oxygen Induction Type: IV induction, Rapid sequence and Cricoid Pressure applied Laryngoscope Size: Mac and 3 Grade View: Grade I Tube type: Subglottic suction tube Tube size: 7.5 mm Number of attempts: 1 Airway Equipment and Method: Stylet Placement Confirmation: ETT inserted through vocal cords under direct vision,  positive ETCO2 and breath sounds checked- equal and bilateral Secured at: 21 cm Tube secured with: Tape Dental Injury: Teeth and Oropharynx as per pre-operative assessment

## 2017-09-10 NOTE — Progress Notes (Signed)
Initial Nutrition Assessment  DOCUMENTATION CODES:   Not applicable  INTERVENTION:    Monitor for diet advancement/toleration post extubation  Recommend nutrition support if NPO status persist  NUTRITION DIAGNOSIS:   Inadequate oral intake related to inability to eat as evidenced by NPO status.  GOAL:   Provide needs based on ASPEN/SCCM guidelines  MONITOR:   Diet advancement, Vent status, Labs, Weight trends  REASON FOR ASSESSMENT:   Ventilator    ASSESSMENT:   Pt with PMH significant for Crohn's disease, hiatal hernia, CKD III, and SBO. Presents this admission s/p exloratory laparotomy that revealed frank pus in abdomen resulting in septic shock. See significant events below.    10/3- laparoscopic hernia repair/placement of G-tube and gastropexy, lysis of adhesions 10/4-Pt transferred to ICU stepdown 10/5-exploratory laparotomy (reveals frank pus), takedown of gastrostomy and repair of gastrotomy, washout and placement of mediastinal drains, hepatorrhaphy x 2, pt intubated, PICC placed  Patient is currently intubated on ventilator support MV: 10.9 L/min Temp (24hrs), Avg:98 F (36.7 C), Min:97.5 F (36.4 C), Max:98.6 F (37 C)  Propofol: None BP: 117/49 MAP: 67  No family at bedside to obtain nutrition history. Weight noted to be increasing from 06/2017. Will monitor trends. Nutrition-Focused physical exam completed. Findings are no fat depletion, no muscle depletion, and mild edema. Fluid accumulation may be masking losses.   Medications reviewed and include: SSI, NS @ 125 ml/hr, fentanyl, levophed, IV abx Labs reviewed: Ionized calcium 0.96 (L) Creatinine 2.98 (H) Co2 11 (L) AST 48 (H)  Diet Order:  Diet NPO time specified  Skin:   (multiple closed incisions abdomen)  Last BM:  09/08/17  Height:   Ht Readings from Last 1 Encounters:  09/10/17 5' 4.5" (1.638 m)    Weight:   Wt Readings from Last 1 Encounters:  09/08/17 133 lb (60.3 kg)    Ideal  Body Weight:  54.5 kg  BMI:  Body mass index is 21.96 kg/m.  Estimated Nutritional Needs:   Kcal:  1314 kcal (PSU)  Protein:  80-90 grams (1.3-1.5 g/kg)  Fluid:  >1.3 L/day  EDUCATION NEEDS:   No education needs identified at this time  [place], LDN Clinical Nutrition Pager # - [phone]

## 2017-09-10 NOTE — Progress Notes (Signed)
Pharmacy Antibiotic Note  Danielle [name] is a 71 y.o. female admitted on 09/08/2017 with paraesophageal hiatal hernia s/p laparoscopic G-tube placement and gastropexy, LOA.  Pharmacy has been consulted for Vancomycin and Zosyn dosing for suspected sepsis on POD1.  Today, 09/10/2017:  Acute rise in SCr overnight  PCT and WBC also grossly elevated  Post-op ex lap reveals frank pus in abdomen  Septic shock d/t intra-abdominal infxn  Plan:  Reduce Zosyn to 2.25 g IV q6 hr (not HCAP, but using higher dose with severe infection)  Stopping vanc d/t AKI, begin Zyvox per CCM  CCM also initiating Eraxis, dosing appropriate  Continue daily SCr while CrCl < 20 on Zosyn  Follow up renal fxn, culture results, and clinical course.  Height: 5' 4.5" (163.8 cm) Weight: 133 lb (60.3 kg) IBW/kg (Calculated) : 55.85  Temp (24hrs), Avg:98 F (36.7 C), Min:97.5 F (36.4 C), Max:98.6 F (37 C)   Recent Labs Lab 09/09/17 0517 09/09/17 0529 09/09/17 1409 09/09/17 1800 09/10/17 0420 09/10/17 0851  WBC  --  35.4*  --   --   --  50.4*  CREATININE 1.32*  --   --   --  2.87* 2.98*  LATICACIDVEN  --   --  4.3* 1.8  --   --     Estimated Creatinine Clearance: 15.7 mL/min (A) (by C-G formula based on SCr of 2.98 mg/dL (H)).    Allergies  Allergen Reactions  . Zithromax [Azithromycin] Swelling and Other (See Comments)    Reaction:  Mouth swelling and blisters   . Aspirin Other (See Comments)    Reaction:  Stomach cramping   . Ibuprofen Other (See Comments)    Pt states that she prefers not to take this medication.   . Iron Nausea And Vomiting    FERROUS SULFATE  . Sulfa Antibiotics Nausea And Vomiting    Antimicrobials this admission: 10/3 Cefazolin x1 dose preop 10/3 Vanc x1 preop, then daily starting 10/4 >> 10/5 10/4 Zosyn >>  10/5 Zyvox >>  10/5 Eraxis >>  Dose adjustments this admission:   Microbiology results: 10/4 BCx: sent 10/5 MRSA PCR: pos  Thank you for allowing  pharmacy to be a part of this patient's care.  Reuel Boom, PharmD, BCPS Pager: [phone] 09/10/2017, 2:06 PM

## 2017-09-10 NOTE — Progress Notes (Signed)
Attempt to perform echo. Nurse and other staff with patient after pulse dropping. Unable to enter room at time.   Danielle [name] 09/10/2017, 3:59 PM

## 2017-09-10 NOTE — Anesthesia Preprocedure Evaluation (Addendum)
Anesthesia Evaluation  Patient identified by MRN, date of birth, ID band Patient awake    Reviewed: Allergy & Precautions, NPO status , Patient's Chart, lab work & pertinent test results  Airway Mallampati: III  TM Distance: >3 FB     Dental  (+) Edentulous Upper, Edentulous Lower   Pulmonary former smoker,     + decreased breath sounds      Cardiovascular  Rhythm:Regular Rate:Tachycardia     Neuro/Psych    GI/Hepatic   Endo/Other    Renal/GU      Musculoskeletal   Abdominal   Peds  Hematology   Anesthesia Other Findings Patient lethargic, tachypneic  Reproductive/Obstetrics                             Anesthesia Physical Anesthesia Plan  ASA: IV and emergent  Anesthesia Plan: General   Post-op Pain Management:    Induction: Intravenous  PONV Risk Score and Plan:   Airway Management Planned: Oral ETT  Additional Equipment: Arterial line and CVP  Intra-op Plan:   Post-operative Plan: Post-operative intubation/ventilation  Informed Consent: I have reviewed the patients History and Physical, chart, labs and discussed the procedure including the risks, benefits and alternatives for the proposed anesthesia with the patient or authorized representative who has indicated his/her understanding and acceptance.     Plan Discussed with: CRNA and Anesthesiologist  Anesthesia Plan Comments:         Anesthesia Quick Evaluation

## 2017-09-10 NOTE — Progress Notes (Signed)
PULMONARY / CRITICAL CARE MEDICINE   Name: Danielle [name] MRN: [id number] DOB: Dec 10, 1945    ADMISSION DATE:  09/08/2017   CONSULTATION DATE:  09/09/2017  REFERRING MD:  Eleonore Chiquito, M.D. / [facility]  CHIEF COMPLAINT:  Hypotension  HISTORY OF PRESENT ILLNESS:  71 y.o. female presenting for laparoscopic repair of hiatal hernia with fundoplication and lysis of adhesions. This morning patient was noted to be tachycardic with hypotension. White blood cell count was significantly elevated. Patient did receive a dose of Decadron yesterday but has been largely afebrile. Patient was also given Lasix IV 1 for possible fluid overload. She was subsequently transitioned to the stepdown unit and internal medicine was consulted. They subsequently consulted me with PCCM with ongoing hypotension. Patient does endorse increased dyspnea but no subjective fever or chills. Does have some abdominal pain but nothing significant. Does have some nausea. Reports she felt well prior to her surgery. Denies any recent sick contacts. Denies any chronic steroids. Patient did receive perioperative prophylactic antibiotics yesterday including Ancef and vancomycin.  SUBJECTIVE: Requiring low dose vasopressors this morning. No acute events overnight. Planned for emergent ex-lap this morning.   REVIEW OF SYSTEMS:  Unable to obtain due to slightly altered mental status.  VITAL SIGNS: BP (!) 76/48   Pulse (!) 134   Temp (!) 97.5 F (36.4 C) (Axillary)   Resp (!) 24   Ht 5' 5.25" (1.657 m)   Wt 133 lb (60.3 kg)   SpO2 100%   BMI 21.96 kg/m   HEMODYNAMICS: CVP:  [7 mmHg-58 mmHg] 24 mmHg  VENTILATOR SETTINGS:    INTAKE / OUTPUT: I/O last 3 completed shifts: In: 2172.9 [P.O.:50; I.V.:1822.9; IV Piggyback:300] Out: 800 [Urine:800]  PHYSICAL EXAMINATION: General:  Awake. No acute distress. No family at bedside.  Integument:  Warm & dry. No rash on exposed skin.  Extremities:  No cyanosis or clubbing.  HEENT:  Tacky  mucous membranes. No scleral icterus. No scleral injection. Cardiovascular:  Tachycardic. Regular rhythm. Sinus on telemetry. No edema.  Pulmonary:  Clear bilaterally on auscultation but slightly diminished in the bases. Mildly increased work of breathing on nasal cannula. Abdomen: Protuberant. Soft. Hypoactive bowel sounds. Gastrostomy tube in place. Neurological:  Awakens to voice. Answers some questions. Grossly nonfocal.  LABS:  BMET  Recent Labs Lab 09/09/17 0517 09/10/17 0420  NA 135  --   K 4.1  --   CL 113*  --   CO2 14*  --   BUN 20  --   CREATININE 1.32* 2.87*  GLUCOSE 163*  --     Electrolytes  Recent Labs Lab 09/09/17 0517  CALCIUM 8.1*    CBC  Recent Labs Lab 09/09/17 0529  WBC 35.4*  HGB 12.9  HCT 40.3  PLT 264    Coag's No results for input(s): APTT, INR in the last 168 hours.  Sepsis Markers  Recent Labs Lab 09/09/17 1409 09/09/17 1800 09/10/17 0420  LATICACIDVEN 4.3* 1.8  --   PROCALCITON  --  25.35 57.49    ABG No results for input(s): PHART, PCO2ART, PO2ART in the last 168 hours.  Liver Enzymes No results for input(s): AST, ALT, ALKPHOS, BILITOT, ALBUMIN in the last 168 hours.  Cardiac Enzymes  Recent Labs Lab 09/09/17 1800 09/10/17 0003 09/10/17 0337  TROPONINI <0.03 <0.03 <0.03    Glucose  Recent Labs Lab 09/09/17 1230 09/09/17 2105 09/10/17 0421 09/10/17 0723  GLUCAP 112* 85 95 87    Imaging Ct Angio Chest Pe W Or Wo  Contrast  Result Date: 09/10/2017 CLINICAL DATA:  Shortness of breath with severe abdominal pain. Recent paraesophageal hernia repair. EXAM: CT ANGIOGRAPHY CHEST CT ABDOMEN AND PELVIS WITH CONTRAST TECHNIQUE: Multidetector CT imaging of the chest was performed using the standard protocol during bolus administration of intravenous contrast. Multiplanar CT image reconstructions and MIPs were obtained to evaluate the vascular anatomy. Multidetector CT imaging of the abdomen and pelvis was performed  using the standard protocol during bolus administration of intravenous contrast. CONTRAST:  80 mL Isovue 370 IV. COMPARISON:  CT chest 05/08/2017 and CT abdomen/ pelvis 06/14/2017 FINDINGS: CTA CHEST FINDINGS Cardiovascular: Heart is normal size an compressed anteriorly by the large hiatal hernia and postoperative air and fluid over the mediastinum adjacent the distal esophagus and hiatal hernia. Minimal calcified plaque over the left anterior descending coronary artery. Thoracic aorta is within normal. No definite pulmonary emboli. Mediastinum/Nodes: No mediastinal or hilar adenopathy. There is a large collection over the mid to lower mediastinum including patient's large hiatal hernia containing contrast as well as a segment of catheter tubing likely extending up from below from patient's percutaneous gastrostomy tube. There is moderate fluid and air around the hiatal hernia and esophagus over the mid to lower mediastinum as the prior exam demonstrated multiple additional bowel loops in this region. It is difficult to determine how much of this air-fluid is free air and fluid versus within bowel loops. No evidence of contrast extravasation. Lungs/Pleura: Examination demonstrates mild bilateral atelectatic change and small effusions. Review of the MIP images confirms the above findings. CT ABDOMEN and PELVIS FINDINGS Hepatobiliary: Previous cholecystectomy.  Liver is unremarkable. Pancreas: Pancreas is within normal. Spleen: Spleen is unremarkable. Adrenals/Urinary Tract: Adrenal glands and kidneys are within normal. Ureters and bladder are unremarkable. Stomach/Bowel: As described above as there is a persistent large hiatal hernia likely with additional bowel loops within the mid to lower mediastinum. Cannot exclude free fluid and free air in the mid to lower mediastinum. No contrast extravasation. Percutaneous gastrostomy tube over the left upper quadrant appears in adequate position. Moderate free peritoneal air  over the upper abdomen compatible recent surgery and gastrostomy tube placement. Patient had a previous partial colectomy. There are surgical clips over several small bowel loops. There are no definite dilated small bowel loops. Visualize colon is unremarkable. There is mild ascites likely from patient's recent surgery. Vascular/Lymphatic: Within normal. Reproductive: Within normal. Other: Postsurgical change over the anterior abdominal wall. Musculoskeletal: Degenerative change of the spine and hips. Stable L1 mild compression fracture. Tarlov cyst. Review of the MIP images confirms the above findings. IMPRESSION: No evidence of pulmonary embolism. Small bilateral pleural effusions with bilateral atelectasis. Persistent large hiatal hernia with large fluid and air collection within the mid to lower mediastinum some of which may be within bowel loops, although it is difficult to assess how much of this air and fluid is free and related to patient's surgery as well as gastrostomy tube placement. There is contrast within the esophagus and hiatal hernia without contrast extravasation. Gastrostomy tube in adequate position. Postsurgical changes over the upper abdomen with free air and ascites. No evidence of bowel obstruction. Evidence of previous partial colectomy. Stable L1 compression fracture. Electronically Signed   By: Marin Olp M.D.   On: 09/10/2017 00:34   Ct Abdomen Pelvis W Contrast  Result Date: 09/10/2017 CLINICAL DATA:  Shortness of breath with severe abdominal pain. Recent paraesophageal hernia repair. EXAM: CT ANGIOGRAPHY CHEST CT ABDOMEN AND PELVIS WITH CONTRAST TECHNIQUE: Multidetector CT imaging of  the chest was performed using the standard protocol during bolus administration of intravenous contrast. Multiplanar CT image reconstructions and MIPs were obtained to evaluate the vascular anatomy. Multidetector CT imaging of the abdomen and pelvis was performed using the standard protocol during  bolus administration of intravenous contrast. CONTRAST:  80 mL Isovue 370 IV. COMPARISON:  CT chest 05/08/2017 and CT abdomen/ pelvis 06/14/2017 FINDINGS: CTA CHEST FINDINGS Cardiovascular: Heart is normal size an compressed anteriorly by the large hiatal hernia and postoperative air and fluid over the mediastinum adjacent the distal esophagus and hiatal hernia. Minimal calcified plaque over the left anterior descending coronary artery. Thoracic aorta is within normal. No definite pulmonary emboli. Mediastinum/Nodes: No mediastinal or hilar adenopathy. There is a large collection over the mid to lower mediastinum including patient's large hiatal hernia containing contrast as well as a segment of catheter tubing likely extending up from below from patient's percutaneous gastrostomy tube. There is moderate fluid and air around the hiatal hernia and esophagus over the mid to lower mediastinum as the prior exam demonstrated multiple additional bowel loops in this region. It is difficult to determine how much of this air-fluid is free air and fluid versus within bowel loops. No evidence of contrast extravasation. Lungs/Pleura: Examination demonstrates mild bilateral atelectatic change and small effusions. Review of the MIP images confirms the above findings. CT ABDOMEN and PELVIS FINDINGS Hepatobiliary: Previous cholecystectomy.  Liver is unremarkable. Pancreas: Pancreas is within normal. Spleen: Spleen is unremarkable. Adrenals/Urinary Tract: Adrenal glands and kidneys are within normal. Ureters and bladder are unremarkable. Stomach/Bowel: As described above as there is a persistent large hiatal hernia likely with additional bowel loops within the mid to lower mediastinum. Cannot exclude free fluid and free air in the mid to lower mediastinum. No contrast extravasation. Percutaneous gastrostomy tube over the left upper quadrant appears in adequate position. Moderate free peritoneal air over the upper abdomen compatible  recent surgery and gastrostomy tube placement. Patient had a previous partial colectomy. There are surgical clips over several small bowel loops. There are no definite dilated small bowel loops. Visualize colon is unremarkable. There is mild ascites likely from patient's recent surgery. Vascular/Lymphatic: Within normal. Reproductive: Within normal. Other: Postsurgical change over the anterior abdominal wall. Musculoskeletal: Degenerative change of the spine and hips. Stable L1 mild compression fracture. Tarlov cyst. Review of the MIP images confirms the above findings. IMPRESSION: No evidence of pulmonary embolism. Small bilateral pleural effusions with bilateral atelectasis. Persistent large hiatal hernia with large fluid and air collection within the mid to lower mediastinum some of which may be within bowel loops, although it is difficult to assess how much of this air and fluid is free and related to patient's surgery as well as gastrostomy tube placement. There is contrast within the esophagus and hiatal hernia without contrast extravasation. Gastrostomy tube in adequate position. Postsurgical changes over the upper abdomen with free air and ascites. No evidence of bowel obstruction. Evidence of previous partial colectomy. Stable L1 compression fracture. Electronically Signed   By: Marin Olp M.D.   On: 09/10/2017 00:34   Dg Abd Portable 1v  Result Date: 09/09/2017 CLINICAL DATA:  Abdominal pain.  Hiatal hernia repair. EXAM: PORTABLE ABDOMEN - 1 VIEW COMPARISON:  07/28/2017. FINDINGS: Nonobstructive gas pattern. Gastrostomy tube LEFT upper quadrant. No worrisome osseous findings. IMPRESSION: As above. Electronically Signed   By: Staci Righter M.D.   On: 09/09/2017 18:37   Dg Esophagus W/water Sol Cm  Result Date: 09/09/2017 CLINICAL DATA:  Hiatal hernia.  Recent gastrostomy tube placement. "Rule out leak." EXAM: ESOPHOGRAM/BARIUM SWALLOW TECHNIQUE: Single contrast examination was performed using  water-soluble contrast. FLUOROSCOPY TIME:  Fluoroscopy Time:  1.0 minutes Radiation Exposure Index (if provided by the fluoroscopic device): 10.4 Number of Acquired Spot Images: 0 COMPARISON:  06/10/2017 and chest CT from 05/08/2017 FINDINGS: The patient tolerated taking 3 small sips of water soluble contrast, a total of about 20 cc. After that she refused all further imaging. She would only allow imaging in the supine position hunched forward about 40 degrees by wedge pillows. On the swallows, the esophagus is noted to deviate laterally to the right before emptying into the very large intrathoracic hiatal hernia at projects over the central chest partially due to the patient positioning. There is some degree of esophageal dysmotility, as well as distal esophageal irregularity. Contrast surrounds the distal esophagus but is probably still within the very large hiatal hernia, which is distended with gas. I tilted the table up about 6 or 7 degrees, which is the most that the patient could tolerate without starting to slide. I was not able to get contrast to empty down into the intra- abdominal portion of the stomach. I am able to see a filling defect in the gastric bubble intra-abdominal portion compatible with the G-tube retention mechanism. IMPRESSION: 1. Today's exam does not exclude leak from the esophagus or stomach. The exam is simply too limited and the patient is not a suitable candidate for fluoroscopic imaging of this type. Esophagram requires the patient's ability to turn a different angles and swallow more than a sip or two of contrast, and this patient's required positioning today, sitting up about 40 degrees with respect to the plane of imaging, is particularly unsuitable to fluoroscopic diagnosis. 2. From what I can tell on the limited assessment achievable, the esophagus is bunched over to the right and empties into a very large hiatal hernia containing at least most of the stomach. The contrast fills  along the posterior wall of the hiatal hernia. The esophagus is irregular distally, and ulceration is not excluded. Chest CT might be an alternative although I am not certain that the patient will tolerate laying down for chest CT. Electronically Signed   By: Van Clines M.D.   On: 09/09/2017 11:25    STUDIES:  PORT CXR 10/4:  Previously reviewed by me. Patchy bilateral opacities that appear new. This is obscured by a large diaphragmatic hernia. Question dextrocardia from previous imaging. No definite pleural effusion appreciated. CTA CHEST 10/4:  Personally reviewed by me. No pulmonary emboli. Extremely large hiatal hernia with contrast containing within stomach. Compressive atelectasis surrounding hernia. No frank opacity or mass appreciated. Small bilateral pleural effusions. No pericardial effusion. CT ABD/PELVIS W/O 10/4:  Persistent large hiatal hernia with large fluid and air collection within the mid to lower mediastinum some of which may be within bowel loops, although it is difficult to assess how much of this air and fluid is free and related to patient's surgery as well as gastrostomy tube placement. There is contrast within the esophagus and hiatal hernia without contrast extravasation. Gastrostomy tube in adequate position. Postsurgical changes over the upper abdomen with free air and ascites. No evidence of bowel obstruction. Evidence of previous partial colectomy. Stable L1 compression fracture.  MICROBIOLOGY: MRSA PCR 9/27:  Positive  Blood Cultures x2 10/4 >>>  ANTIBIOTICS: Ancef 10/3 (periop) Vancomycin 10/3 >>> Zosyn 10/4 >>>  SIGNIFICANT EVENTS: 10/03 - Admit w/ lap placement of gastrostomy tube & gastropexy with  lysis of adhesions 10/04 - PCCM consult for hypotension post-op  10/05 - Emergent Ex Lap  LINES/TUBES: LUQ GASTROSTOMY TUBE 10/3 >>> RUE DL PICC 10/4 >>> PIV  ASSESSMENT / PLAN:  PULMONARY A: Acute hypoxic respiratory failure: Likely some element of  atelectasis from large hiatal hernia & edema from volume overload.  P:   Continuous pulse oximetry monitoring Weaning FiO2 See ID  CARDIOVASCULAR A:  Shock:  Multifactorial from narcotics and probable sepsis.  P:  Neo-Synephrine to maintain MAP >65 Continuous telemetry monitoring Vitals per unit protocol Awaiting complete echocardiogram  RENAL A:   Acute renal failure: Likely secondary to hypotension.  Lactic acidosis: Likely due to hypoperfusion. Resolved.  P:   Monitor urine output with Foley catheter Trending electrolytes & renal function daily Replace electrolytes as indicated Maintaining mean arterial pressure greater than 65  GASTROINTESTINAL A:   Hiatal hernia:  S/P laparoscopic surgery with lysis of adhesions 10/3. Crohn's Disease  P:   NPO Emergent exploratory laparotomy this morning  HEMATOLOGIC A:   Leukocytosis: Likely secondary to sepsis.  P:  Trending cell counts daily with CBC  INFECTIOUS A:   Possible sepsis: Likely intra-abdominal source.   P:   Awaiting finalization of cultures Doing empiric vancomycin & Zosyn Consider adding Eraxis 2 regimen pending results of surgery Trending Procalcitonin per algorithm  ENDOCRINE A:   Hyperglycemia:  No h/o DM. A1c 5.0.     P:   D/C Solu-Cortef Continuing Accu-Cheks every 4 hours while nothing by mouth Continuing Sliding-scale insulin per sensitive algorithm  NEUROLOGIC A:   Acute encephalopathy:  Mild. Due to pain medication. Post-op pain  P:   Monitoring closely in ICU Fentanyl IV prn  Prophylaxis:  SCDs, Protonix IV daily, & changing from Lovenox to Heparin C[place] q8hr.  Diet:  NPO. Code Status:  Full Code confirmed with patient & sister yesterday. Disposition: Emergent surgery this morning w/ return to ICU. Family Update: Sister updated by me via phone this morning.   DISCUSSION:  71 y.o. female status post laparoscopic surgery of the abdomen with ongoing shock. Likely source  intra-abdominal. Plan for emergent exploratory laparotomy this morning. Plan to consider antifungal coverage postoperatively depending upon findings. Plan to reassess patient postop.   I have spent a total of 34 minutes of critical care time today caring for the patient, updating her sister via phone. and reviewing the patient's electronic medical record.   [name], M.D. [facility] Pulmonary & Critical Care Pager:  [phone] After 7pm or if no response, call [phone] 09/10/2017, 8:48 AM

## 2017-09-10 NOTE — Progress Notes (Signed)
PCCM Attending Note:  Left internal jugular CVL placed by anesthesia but not positioned appropriately on portable CXR review. Bedside ultrasound seems to show catheter in internal jugular vein. Transduced pressure is venous without arterial waveform and blood glass consistent with venous sample. Order placed for discontinuation of left IJ CVL.  [name], M.D. [facility] Pulmonary & Critical [facility] Pager:  [phone] After 7pm or if no response, call [phone] 2:42 PM 09/10/17

## 2017-09-11 ENCOUNTER — Inpatient Hospital Stay (HOSPITAL_COMMUNITY): Payer: Medicare HMO

## 2017-09-11 DIAGNOSIS — J9601 Acute respiratory failure with hypoxia: Secondary | ICD-10-CM

## 2017-09-11 DIAGNOSIS — I361 Nonrheumatic tricuspid (valve) insufficiency: Secondary | ICD-10-CM

## 2017-09-11 DIAGNOSIS — B49 Unspecified mycosis: Secondary | ICD-10-CM | POA: Diagnosis present

## 2017-09-11 DIAGNOSIS — Z9889 Other specified postprocedural states: Secondary | ICD-10-CM

## 2017-09-11 DIAGNOSIS — K659 Peritonitis, unspecified: Secondary | ICD-10-CM | POA: Diagnosis not present

## 2017-09-11 DIAGNOSIS — Z931 Gastrostomy status: Secondary | ICD-10-CM

## 2017-09-11 LAB — BLOOD CULTURE ID PANEL (REFLEXED)
ACINETOBACTER BAUMANNII: NOT DETECTED
CANDIDA ALBICANS: NOT DETECTED
CANDIDA GLABRATA: DETECTED — AB
CANDIDA TROPICALIS: NOT DETECTED
Candida krusei: NOT DETECTED
Candida parapsilosis: NOT DETECTED
ENTEROBACTER CLOACAE COMPLEX: NOT DETECTED
ENTEROBACTERIACEAE SPECIES: NOT DETECTED
ENTEROCOCCUS SPECIES: NOT DETECTED
Escherichia coli: NOT DETECTED
HAEMOPHILUS INFLUENZAE: NOT DETECTED
KLEBSIELLA PNEUMONIAE: NOT DETECTED
Klebsiella oxytoca: NOT DETECTED
LISTERIA MONOCYTOGENES: NOT DETECTED
NEISSERIA MENINGITIDIS: NOT DETECTED
Proteus species: NOT DETECTED
Pseudomonas aeruginosa: NOT DETECTED
STREPTOCOCCUS AGALACTIAE: NOT DETECTED
STREPTOCOCCUS PNEUMONIAE: NOT DETECTED
STREPTOCOCCUS PYOGENES: NOT DETECTED
STREPTOCOCCUS SPECIES: NOT DETECTED
Serratia marcescens: NOT DETECTED
Staphylococcus aureus (BCID): NOT DETECTED
Staphylococcus species: NOT DETECTED

## 2017-09-11 LAB — URINE CULTURE: Culture: NO GROWTH

## 2017-09-11 LAB — CBC WITH DIFFERENTIAL/PLATELET
BASOS ABS: 0 10*3/uL (ref 0.0–0.1)
BASOS PCT: 0 %
EOS ABS: 0.2 10*3/uL (ref 0.0–0.7)
EOS PCT: 1 %
HCT: 23 % — ABNORMAL LOW (ref 36.0–46.0)
Hemoglobin: 7.7 g/dL — ABNORMAL LOW (ref 12.0–15.0)
Lymphocytes Relative: 9 %
Lymphs Abs: 2.2 10*3/uL (ref 0.7–4.0)
MCH: 29.3 pg (ref 26.0–34.0)
MCHC: 33.5 g/dL (ref 30.0–36.0)
MCV: 87.5 fL (ref 78.0–100.0)
MONO ABS: 1.4 10*3/uL — AB (ref 0.1–1.0)
MONOS PCT: 6 %
NEUTROS ABS: 19.7 10*3/uL — AB (ref 1.7–7.7)
Neutrophils Relative %: 84 %
PLATELETS: 140 10*3/uL — AB (ref 150–400)
RBC: 2.63 MIL/uL — ABNORMAL LOW (ref 3.87–5.11)
RDW: 16.3 % — AB (ref 11.5–15.5)
WBC: 23.4 10*3/uL — ABNORMAL HIGH (ref 4.0–10.5)

## 2017-09-11 LAB — COMPREHENSIVE METABOLIC PANEL
ALT: 29 U/L (ref 14–54)
AST: 45 U/L — ABNORMAL HIGH (ref 15–41)
Albumin: 1.8 g/dL — ABNORMAL LOW (ref 3.5–5.0)
Alkaline Phosphatase: 93 U/L (ref 38–126)
Anion gap: 9 (ref 5–15)
BILIRUBIN TOTAL: 0.7 mg/dL (ref 0.3–1.2)
BUN: 30 mg/dL — AB (ref 6–20)
CHLORIDE: 114 mmol/L — AB (ref 101–111)
CO2: 17 mmol/L — ABNORMAL LOW (ref 22–32)
CREATININE: 2.46 mg/dL — AB (ref 0.44–1.00)
Calcium: 6 mg/dL — CL (ref 8.9–10.3)
GFR, EST AFRICAN AMERICAN: 22 mL/min — AB (ref >60)
GFR, EST NON AFRICAN AMERICAN: 19 mL/min — AB (ref >60)
Glucose, Bld: 171 mg/dL — ABNORMAL HIGH (ref 65–99)
Potassium: 3.3 mmol/L — ABNORMAL LOW (ref 3.5–5.1)
Sodium: 140 mmol/L (ref 135–145)
TOTAL PROTEIN: 4.4 g/dL — AB (ref 6.5–8.1)

## 2017-09-11 LAB — BLOOD GAS, ARTERIAL
ACID-BASE DEFICIT: 10.2 mmol/L — AB (ref 0.0–2.0)
BICARBONATE: 14.7 mmol/L — AB (ref 20.0–28.0)
Drawn by: 225631
FIO2: 40
O2 Saturation: 97.8 %
PEEP/CPAP: 8 cmH2O
PH ART: 7.289 — AB (ref 7.350–7.450)
Patient temperature: 100.6
RATE: 24 resp/min
VT: 0.45 mL
pCO2 arterial: 32.1 mmHg (ref 32.0–48.0)
pO2, Arterial: 145 mmHg — ABNORMAL HIGH (ref 83.0–108.0)

## 2017-09-11 LAB — GLUCOSE, CAPILLARY
GLUCOSE-CAPILLARY: 86 mg/dL (ref 65–99)
GLUCOSE-CAPILLARY: 58 mg/dL — AB (ref 65–99)
GLUCOSE-CAPILLARY: 91 mg/dL (ref 65–99)
GLUCOSE-CAPILLARY: 95 mg/dL (ref 65–99)
Glucose-Capillary: 90 mg/dL (ref 65–99)
Glucose-Capillary: 73 mg/dL (ref 65–99)
Glucose-Capillary: 61 mg/dL — ABNORMAL LOW (ref 65–99)

## 2017-09-11 LAB — PHOSPHORUS: PHOSPHORUS: 4.1 mg/dL (ref 2.5–4.6)

## 2017-09-11 LAB — ECHOCARDIOGRAM COMPLETE
HEIGHTINCHES: 64.5 in
Weight: 2128 oz

## 2017-09-11 LAB — PROCALCITONIN: PROCALCITONIN: 52.02 ng/mL

## 2017-09-11 LAB — MAGNESIUM: MAGNESIUM: 1 mg/dL — AB (ref 1.7–2.4)

## 2017-09-11 MED ORDER — POTASSIUM CHLORIDE 10 MEQ/50ML IV SOLN
10.0000 meq | INTRAVENOUS | Status: AC
  Administered 2017-09-11 (×4): 10 meq via INTRAVENOUS
  Filled 2017-09-11 (×4): qty 50

## 2017-09-11 MED ORDER — FENTANYL CITRATE (PF) 100 MCG/2ML IJ SOLN
25.0000 ug | INTRAMUSCULAR | Status: DC | PRN
  Administered 2017-09-11 (×2): 50 ug via INTRAVENOUS
  Filled 2017-09-11 (×2): qty 2

## 2017-09-11 MED ORDER — ALBUMIN HUMAN 25 % IV SOLN
25.0000 g | Freq: Four times a day (QID) | INTRAVENOUS | Status: AC
  Administered 2017-09-11 – 2017-09-13 (×8): 25 g via INTRAVENOUS
  Filled 2017-09-11 (×3): qty 100
  Filled 2017-09-11 (×4): qty 50
  Filled 2017-09-11: qty 100
  Filled 2017-09-11 (×2): qty 50

## 2017-09-11 MED ORDER — DEXTROSE-NACL 5-0.9 % IV SOLN
INTRAVENOUS | Status: DC
  Administered 2017-09-11 – 2017-09-13 (×4): via INTRAVENOUS

## 2017-09-11 MED ORDER — FENTANYL CITRATE (PF) 100 MCG/2ML IJ SOLN
50.0000 ug | INTRAMUSCULAR | Status: DC | PRN
  Administered 2017-09-11 – 2017-09-12 (×11): 100 ug via INTRAVENOUS
  Administered 2017-09-13: 50 ug via INTRAVENOUS
  Administered 2017-09-13 (×3): 100 ug via INTRAVENOUS
  Administered 2017-09-13 (×2): 50 ug via INTRAVENOUS
  Administered 2017-09-13: 100 ug via INTRAVENOUS
  Administered 2017-09-14 – 2017-09-15 (×5): 50 ug via INTRAVENOUS
  Administered 2017-09-15: 100 ug via INTRAVENOUS
  Administered 2017-09-15 (×2): 50 ug via INTRAVENOUS
  Administered 2017-09-15 – 2017-09-17 (×14): 100 ug via INTRAVENOUS
  Administered 2017-09-17: 50 ug via INTRAVENOUS
  Administered 2017-09-17: 100 ug via INTRAVENOUS
  Administered 2017-09-17: 50 ug via INTRAVENOUS
  Administered 2017-09-17 (×2): 100 ug via INTRAVENOUS
  Administered 2017-09-17: 50 ug via INTRAVENOUS
  Administered 2017-09-17: 100 ug via INTRAVENOUS
  Administered 2017-09-18 (×3): 50 ug via INTRAVENOUS
  Administered 2017-09-18 (×4): 100 ug via INTRAVENOUS
  Administered 2017-09-18 – 2017-09-19 (×3): 50 ug via INTRAVENOUS
  Administered 2017-09-19: 100 ug via INTRAVENOUS
  Administered 2017-09-19 – 2017-09-21 (×12): 50 ug via INTRAVENOUS
  Administered 2017-09-21: 100 ug via INTRAVENOUS
  Administered 2017-09-21 – 2017-09-22 (×7): 50 ug via INTRAVENOUS
  Administered 2017-09-22: 100 ug via INTRAVENOUS
  Administered 2017-09-23 (×4): 50 ug via INTRAVENOUS
  Filled 2017-09-11 (×85): qty 2

## 2017-09-11 MED ORDER — SODIUM CHLORIDE 0.9 % IV SOLN: 100.0000 mg | INTRAVENOUS | Status: DC

## 2017-09-11 MED ORDER — SODIUM CHLORIDE 0.9 % IV SOLN: 200.0000 mg | Freq: Once | INTRAVENOUS | Status: DC

## 2017-09-11 MED ORDER — DEXTROSE 50 % IV SOLN
25.0000 mL | Freq: Once | INTRAVENOUS | Status: AC
  Administered 2017-09-11 – 2017-09-12 (×2): 25 mL via INTRAVENOUS
  Filled 2017-09-11: qty 50

## 2017-09-11 MED ORDER — MAGNESIUM SULFATE 4 GM/100ML IV SOLN
4.0000 g | Freq: Once | INTRAVENOUS | Status: AC
  Administered 2017-09-11: 4 g via INTRAVENOUS
  Filled 2017-09-11: qty 100

## 2017-09-11 MED ORDER — SODIUM CHLORIDE 0.9 % IV BOLUS (SEPSIS)
1000.0000 mL | Freq: Once | INTRAVENOUS | Status: AC
  Administered 2017-09-11: 1000 mL via INTRAVENOUS

## 2017-09-11 MED ORDER — SODIUM CHLORIDE 0.9 % IV SOLN
2.0000 g | Freq: Once | INTRAVENOUS | Status: AC
  Administered 2017-09-11: 2 g via INTRAVENOUS
  Filled 2017-09-11: qty 20

## 2017-09-11 NOTE — Progress Notes (Signed)
[facility] Progress Note Patient Name: Danielle [name] DOB: [birth year] MRN: [id number]   Date of Service  09/11/2017  HPI/Events of Note  Pain - 50 mcg IV Q 2 hours not holding her pain for 2 hours. BP = 107/51 with MAP = 70.  eICU Interventions  Will order: 1. Increase Fentanyl dose to 50-100 mcg IV Q 2 hours PRN pain.      Intervention Category Intermediate Interventions: Pain - evaluation and management  Sommer,Steven Eugene 09/11/2017, 11:37 PM

## 2017-09-11 NOTE — Progress Notes (Signed)
  Echocardiogram 2D Echocardiogram has been performed.  Danielle [name] 09/11/2017, 10:53 AM

## 2017-09-11 NOTE — Consult Note (Signed)
[facility]    Date of Admission:  09/08/2017   Total days of antibiotics 4        Day 3 piperacillin tazobactam        Day 2 linezolid        Day 2 anidulafungin       Reason for Consult: Automatic consultation for fungemia     Assessment: She developed transient fungemia complicating peritonitis caused by gastric leak. I have ordered repeat blood cultures and will continue current broad antimicrobial therapy.   Plan: 1. Continue current antimicrobial therapy 2. Await results of final blood cultures   Active Problems:   Peritonitis ([place])   Fungemia   Hiatal hernia   Gastrostomy tube in place [facility])   History of repair of hiatal hernia   Acute renal failure superimposed on stage 3 chronic kidney disease (HCC)   Scheduled Meds: . chlorhexidine gluconate (MEDLINE KIT)  15 mL Mouth Rinse BID  . heparin subcutaneous  5,000 Units Subcutaneous Q8H  . insulin aspart  0-9 Units Subcutaneous Q4H  . mouth rinse  15 mL Mouth Rinse QID  . pantoprazole (PROTONIX) IV  40 mg Intravenous Q24H  . sodium chloride flush  10-40 mL Intracatheter Q12H   Continuous Infusions: . albumin human    . anidulafungin Stopped (09/11/17 1635)  . dextrose 5 % and 0.9% NaCl 100 mL/hr at 09/11/17 1633  . linezolid (ZYVOX) IV Stopped (09/11/17 1158)  . norepinephrine (LEVOPHED) Adult infusion 12 mcg/min (09/11/17 1412)  . piperacillin-tazobactam (ZOSYN)  IV Stopped (09/11/17 1330)   PRN Meds:.albuterol, fentaNYL (SUBLIMAZE) injection, hydrALAZINE, naLOXone (NARCAN)  injection, ondansetron **OR** ondansetron (ZOFRAN) IV, promethazine, sodium chloride flush  HPI: Danielle [name] is a 71 y.o. female was admitted on 09/08/2017 and underwent laparoscopic gastropexy for hiatal hernia. She also had extensive lysis of adhesions. Postoperatively she developed sepsis with tachycardia and hypotension. CT scan was performed on 09/09/2017 which showed fluid and air in the  mediastinum. She underwent exploratory laparotomy which revealed purulent peritonitis and leaking around her gastrostomy tube. This was repaired. She was started on broad empiric antibiotic therapy with vancomycin and piperacillin tazobactam. She developed acute on chronic renal insufficiency and the vancomycin was changed to linezolid. Anidulafungin was also added. One of 2 blood cultures is growing yeast today. She was extubated earlier today.   Review of Systems: Review of Systems  Unable to perform ROS: Medical condition    Past Medical History:  Diagnosis Date  . Crohn disease ([place])   . Hiatal hernia 12/07/2015  . Psoriasis     Social History  Substance Use Topics  . Smoking status: Former Smoker    Packs/day: 0.12    Years: 4.00    Types: Cigarettes    Quit date: 10/05/1996  . Smokeless tobacco: Never Used     Comment: 08/22/2013 1 pack every two weeks when she did smoked  . Alcohol use No    Family History  Problem Relation Age of Onset  . Diabetes Mother   . Heart disease Father   . Healthy Sister   . Hypertension Brother   . Colon cancer Brother        died with colon cancer  . Crohn's disease Paternal Aunt    Allergies  Allergen Reactions  . Zithromax [Azithromycin] Swelling and Other (See Comments)    Reaction:  Mouth swelling and blisters   . Aspirin Other (See Comments)    Reaction:  Stomach cramping   . Ibuprofen Other (See Comments)    Pt states that she prefers not to take this medication.   . Iron Nausea And Vomiting    FERROUS SULFATE  . Sulfa Antibiotics Nausea And Vomiting    OBJECTIVE: Blood pressure (!) 98/47, pulse (!) 129, temperature 98.3 F (36.8 C), temperature source Oral, resp. rate (!) 29, height 5' 4.5" (1.638 m), weight 133 lb (60.3 kg), SpO2 98 %.  Physical Exam  Constitutional: She is oriented to person, place, and time.  She is resting quietly in bed.  Cardiovascular: Normal rate and regular rhythm.   No murmur  heard. Pulmonary/Chest: Effort normal and breath sounds normal.  Abdominal: Soft.  Neurological: She is alert and oriented to person, place, and time.  Psychiatric: Mood and affect normal.    Lab Results Lab Results  Component Value Date   WBC 23.4 (H) 09/11/2017   HGB 7.7 (L) 09/11/2017   HCT 23.0 (L) 09/11/2017   MCV 87.5 09/11/2017   PLT 140 (L) 09/11/2017    Lab Results  Component Value Date   CREATININE 2.46 (H) 09/11/2017   BUN 30 (H) 09/11/2017   NA 140 09/11/2017   K 3.3 (L) 09/11/2017   CL 114 (H) 09/11/2017   CO2 17 (L) 09/11/2017    Lab Results  Component Value Date   ALT 29 09/11/2017   AST 45 (H) 09/11/2017   ALKPHOS 93 09/11/2017   BILITOT 0.7 09/11/2017     Microbiology: Recent Results (from the past 240 hour(s))  Surgical pcr screen     Status: Abnormal   Collection Time: 09/02/17 10:40 AM  Result Value Ref Range Status   MRSA, PCR POSITIVE (A) NEGATIVE Final    Comment: RESULT CALLED TO, READ BACK BY AND VERIFIED WITH: STANLEY,KAY @1456  ON 09.27.18 BY COHEN,K    Staphylococcus aureus POSITIVE (A) NEGATIVE Final    Comment: (NOTE) The Xpert SA Assay (FDA approved for NASAL specimens in patients 27 years of age and older), is one component of a comprehensive surveillance program. It is not intended to diagnose infection nor to guide or monitor treatment.   Culture, blood (Routine X 2) w Reflex to ID Panel     Status: None (Preliminary result)   Collection Time: 09/09/17  2:09 PM  Result Value Ref Range Status   Specimen Description BLOOD RIGHT ANTECUBITAL  Final   Special Requests IN PEDIATRIC BOTTLE Blood Culture adequate volume  Final   Culture  Setup Time   Final    BUDDING YEAST SEEN IN PEDIATRIC BOTTLE Organism ID to follow Performed at [facility], [place] [address], [place], [place] [address]    Culture YEAST  Final   Report Status PENDING  Incomplete  Culture, blood (Routine X 2) w Reflex to ID Panel     Status: None  (Preliminary result)   Collection Time: 09/09/17  2:34 PM  Result Value Ref Range Status   Specimen Description BLOOD RIGHT HAND  Final   Special Requests IN PEDIATRIC BOTTLE Blood Culture adequate volume  Final   Culture   Final    NO GROWTH 2 DAYS Performed at [facility], [place] [address], [place], [place] [address]    Report Status PENDING  Incomplete  Culture, Urine     Status: None   Collection Time: 09/10/17  1:49 PM  Result Value Ref Range Status   Specimen Description URINE, RANDOM  Final   Special Requests NONE  Final  Culture   Final    NO GROWTH Performed at [facility], [place] [address] [place],  [address]    Report Status 09/11/2017 FINAL  Final    Michel Bickers, MD [facility] [phone] pager   [phone] cell 09/11/2017, 4:41 PM

## 2017-09-11 NOTE — Progress Notes (Signed)
PHARMACY - PHYSICIAN COMMUNICATION CRITICAL VALUE ALERT - BLOOD CULTURE IDENTIFICATION (BCID)  Results for orders placed or performed during the hospital encounter of 09/08/17  Blood Culture ID Panel (Reflexed) (Collected: 09/09/2017  2:09 PM)  Result Value Ref Range   Enterococcus species NOT DETECTED NOT DETECTED   Listeria monocytogenes NOT DETECTED NOT DETECTED   Staphylococcus species NOT DETECTED NOT DETECTED   Staphylococcus aureus NOT DETECTED NOT DETECTED   Streptococcus species NOT DETECTED NOT DETECTED   Streptococcus agalactiae NOT DETECTED NOT DETECTED   Streptococcus pneumoniae NOT DETECTED NOT DETECTED   Streptococcus pyogenes NOT DETECTED NOT DETECTED   Acinetobacter baumannii NOT DETECTED NOT DETECTED   Enterobacteriaceae species NOT DETECTED NOT DETECTED   Enterobacter cloacae complex NOT DETECTED NOT DETECTED   Escherichia coli NOT DETECTED NOT DETECTED   Klebsiella oxytoca NOT DETECTED NOT DETECTED   Klebsiella pneumoniae NOT DETECTED NOT DETECTED   Proteus species NOT DETECTED NOT DETECTED   Serratia marcescens NOT DETECTED NOT DETECTED   Haemophilus influenzae NOT DETECTED NOT DETECTED   Neisseria meningitidis NOT DETECTED NOT DETECTED   Pseudomonas aeruginosa NOT DETECTED NOT DETECTED   Candida albicans NOT DETECTED NOT DETECTED   Candida glabrata DETECTED (A) NOT DETECTED   Candida krusei NOT DETECTED NOT DETECTED   Candida parapsilosis NOT DETECTED NOT DETECTED   Candida tropicalis NOT DETECTED NOT DETECTED    Name of physician (or Provider) Contacted: Dr. Megan Salon  Changes to prescribed antibiotics required: continue Eraxis   Royetta Asal, PharmD, BCPS Pager [phone] 09/11/2017 5:14 PM

## 2017-09-11 NOTE — Progress Notes (Signed)
1 Day Post-Op   Subjective/Chief Complaint: No acute issues overnight. Remains intubated. On 25 of fentanyl and levo @26    Objective: Vital signs in last 24 hours: Temp:  [97.5 F (36.4 C)-100.6 F (38.1 C)] 100.6 F (38.1 C) (10/06 0341) Pulse Rate:  [71-142] 142 (10/06 0300) Resp:  [7-25] 24 (10/06 0500) BP: (59-117)/(13-75) 98/47 (10/06 0010) SpO2:  [77 %-100 %] 100 % (10/06 0300) Arterial Line BP: (74-178)/(42-72) 118/50 (10/06 0500) FiO2 (%):  [40 %-60 %] 40 % (10/06 0350) Last BM Date: 09/08/17  Intake/Output from previous day: 10/05 0701 - 10/06 0700 In: 5819.6 [I.V.:4859.6; IV Piggyback:960] Out: 2290 [Urine:1550; Drains:540; Blood:200] Intake/Output this shift: Total I/O In: 845.8 [I.V.:545.8; IV Piggyback:300] Out: 765 [Urine:650; Drains:115]  General appearance: intubated, follows commands Resp: sats 100% on FiO2 40, PEEP 8 GI: soft, appropriately tender. JP drains with serosanquinous output. Midline wound pink and viable, no abnormal drainage. NG tube with blood-tinged gastric fluid in tubing. Suction does not appear to be working.  Extremities: extremities warm, diffuse peripheral edema Skin: Skin color, texture, turgor normal. No rashes or lesions Incision/Wound: wet to dry, open wound with healthy tissue  Lab Results:   Recent Labs  09/10/17 0851  09/10/17 1214 09/11/17 0509  WBC 50.4*  --   --  23.4*  HGB 10.1*  < > 7.8* 7.7*  HCT 30.9*  < > 23.0* 23.0*  PLT 209  --   --  140*  < > = values in this interval not displayed. BMET  Recent Labs  09/09/17 0517 09/10/17 0420 09/10/17 0851 09/10/17 1111 09/10/17 1214  NA 135  --  139 140 144  K 4.1  --  4.3 4.4 3.7  CL 113*  --  118*  --   --   CO2 14*  --  11*  --   --   GLUCOSE 163*  --  93  --   --   BUN 20  --  33*  --   --   CREATININE 1.32* 2.87* 2.98*  --   --   CALCIUM 8.1*  --  6.9*  --   --    PT/INR No results for input(s): LABPROT, INR in the last 72 hours. ABG  Recent Labs   09/10/17 1416 09/10/17 1418 09/11/17 0355  PHART 7.268*  --  7.289*  HCO3 14.3* 16.0* 14.7*    Studies/Results: Ct Angio Chest Pe W Or Wo Contrast  Result Date: 09/10/2017 CLINICAL DATA:  Shortness of breath with severe abdominal pain. Recent paraesophageal hernia repair. EXAM: CT ANGIOGRAPHY CHEST CT ABDOMEN AND PELVIS WITH CONTRAST TECHNIQUE: Multidetector CT imaging of the chest was performed using the standard protocol during bolus administration of intravenous contrast. Multiplanar CT image reconstructions and MIPs were obtained to evaluate the vascular anatomy. Multidetector CT imaging of the abdomen and pelvis was performed using the standard protocol during bolus administration of intravenous contrast. CONTRAST:  80 mL Isovue 370 IV. COMPARISON:  CT chest 05/08/2017 and CT abdomen/ pelvis 06/14/2017 FINDINGS: CTA CHEST FINDINGS Cardiovascular: Heart is normal size an compressed anteriorly by the large hiatal hernia and postoperative air and fluid over the mediastinum adjacent the distal esophagus and hiatal hernia. Minimal calcified plaque over the left anterior descending coronary artery. Thoracic aorta is within normal. No definite pulmonary emboli. Mediastinum/Nodes: No mediastinal or hilar adenopathy. There is a large collection over the mid to lower mediastinum including patient's large hiatal hernia containing contrast as well as a segment of catheter tubing likely extending  up from below from patient's percutaneous gastrostomy tube. There is moderate fluid and air around the hiatal hernia and esophagus over the mid to lower mediastinum as the prior exam demonstrated multiple additional bowel loops in this region. It is difficult to determine how much of this air-fluid is free air and fluid versus within bowel loops. No evidence of contrast extravasation. Lungs/Pleura: Examination demonstrates mild bilateral atelectatic change and small effusions. Review of the MIP images confirms the above  findings. CT ABDOMEN and PELVIS FINDINGS Hepatobiliary: Previous cholecystectomy.  Liver is unremarkable. Pancreas: Pancreas is within normal. Spleen: Spleen is unremarkable. Adrenals/Urinary Tract: Adrenal glands and kidneys are within normal. Ureters and bladder are unremarkable. Stomach/Bowel: As described above as there is a persistent large hiatal hernia likely with additional bowel loops within the mid to lower mediastinum. Cannot exclude free fluid and free air in the mid to lower mediastinum. No contrast extravasation. Percutaneous gastrostomy tube over the left upper quadrant appears in adequate position. Moderate free peritoneal air over the upper abdomen compatible recent surgery and gastrostomy tube placement. Patient had a previous partial colectomy. There are surgical clips over several small bowel loops. There are no definite dilated small bowel loops. Visualize colon is unremarkable. There is mild ascites likely from patient's recent surgery. Vascular/Lymphatic: Within normal. Reproductive: Within normal. Other: Postsurgical change over the anterior abdominal wall. Musculoskeletal: Degenerative change of the spine and hips. Stable L1 mild compression fracture. Tarlov cyst. Review of the MIP images confirms the above findings. IMPRESSION: No evidence of pulmonary embolism. Small bilateral pleural effusions with bilateral atelectasis. Persistent large hiatal hernia with large fluid and air collection within the mid to lower mediastinum some of which may be within bowel loops, although it is difficult to assess how much of this air and fluid is free and related to patient's surgery as well as gastrostomy tube placement. There is contrast within the esophagus and hiatal hernia without contrast extravasation. Gastrostomy tube in adequate position. Postsurgical changes over the upper abdomen with free air and ascites. No evidence of bowel obstruction. Evidence of previous partial colectomy. Stable L1  compression fracture. Electronically Signed   By: Marin Olp M.D.   On: 09/10/2017 00:34   Ct Abdomen Pelvis W Contrast  Result Date: 09/10/2017 CLINICAL DATA:  Shortness of breath with severe abdominal pain. Recent paraesophageal hernia repair. EXAM: CT ANGIOGRAPHY CHEST CT ABDOMEN AND PELVIS WITH CONTRAST TECHNIQUE: Multidetector CT imaging of the chest was performed using the standard protocol during bolus administration of intravenous contrast. Multiplanar CT image reconstructions and MIPs were obtained to evaluate the vascular anatomy. Multidetector CT imaging of the abdomen and pelvis was performed using the standard protocol during bolus administration of intravenous contrast. CONTRAST:  80 mL Isovue 370 IV. COMPARISON:  CT chest 05/08/2017 and CT abdomen/ pelvis 06/14/2017 FINDINGS: CTA CHEST FINDINGS Cardiovascular: Heart is normal size an compressed anteriorly by the large hiatal hernia and postoperative air and fluid over the mediastinum adjacent the distal esophagus and hiatal hernia. Minimal calcified plaque over the left anterior descending coronary artery. Thoracic aorta is within normal. No definite pulmonary emboli. Mediastinum/Nodes: No mediastinal or hilar adenopathy. There is a large collection over the mid to lower mediastinum including patient's large hiatal hernia containing contrast as well as a segment of catheter tubing likely extending up from below from patient's percutaneous gastrostomy tube. There is moderate fluid and air around the hiatal hernia and esophagus over the mid to lower mediastinum as the prior exam demonstrated multiple additional bowel  loops in this region. It is difficult to determine how much of this air-fluid is free air and fluid versus within bowel loops. No evidence of contrast extravasation. Lungs/Pleura: Examination demonstrates mild bilateral atelectatic change and small effusions. Review of the MIP images confirms the above findings. CT ABDOMEN and PELVIS  FINDINGS Hepatobiliary: Previous cholecystectomy.  Liver is unremarkable. Pancreas: Pancreas is within normal. Spleen: Spleen is unremarkable. Adrenals/Urinary Tract: Adrenal glands and kidneys are within normal. Ureters and bladder are unremarkable. Stomach/Bowel: As described above as there is a persistent large hiatal hernia likely with additional bowel loops within the mid to lower mediastinum. Cannot exclude free fluid and free air in the mid to lower mediastinum. No contrast extravasation. Percutaneous gastrostomy tube over the left upper quadrant appears in adequate position. Moderate free peritoneal air over the upper abdomen compatible recent surgery and gastrostomy tube placement. Patient had a previous partial colectomy. There are surgical clips over several small bowel loops. There are no definite dilated small bowel loops. Visualize colon is unremarkable. There is mild ascites likely from patient's recent surgery. Vascular/Lymphatic: Within normal. Reproductive: Within normal. Other: Postsurgical change over the anterior abdominal wall. Musculoskeletal: Degenerative change of the spine and hips. Stable L1 mild compression fracture. Tarlov cyst. Review of the MIP images confirms the above findings. IMPRESSION: No evidence of pulmonary embolism. Small bilateral pleural effusions with bilateral atelectasis. Persistent large hiatal hernia with large fluid and air collection within the mid to lower mediastinum some of which may be within bowel loops, although it is difficult to assess how much of this air and fluid is free and related to patient's surgery as well as gastrostomy tube placement. There is contrast within the esophagus and hiatal hernia without contrast extravasation. Gastrostomy tube in adequate position. Postsurgical changes over the upper abdomen with free air and ascites. No evidence of bowel obstruction. Evidence of previous partial colectomy. Stable L1 compression fracture. Electronically  Signed   By: Marin Olp M.D.   On: 09/10/2017 00:34   Portable Chest Xray  Result Date: 09/11/2017 CLINICAL DATA:  Acute respiratory failure.  Ventilator support. EXAM: PORTABLE CHEST 1 VIEW COMPARISON:  09/10/2017.  09/09/2017. FINDINGS: Endotracheal tube is 1 cm above the carina. Nasogastric tube enters the abdomen. Right arm PICC tip is in the right atrium. Surgical drain tubes overlie the chest and abdomen. Persistent atelectasis/infiltrate in both lower lobes. IMPRESSION: Lines and tubes well positioned. Mild lower lobe atelectasis/infiltrate following thoracic and abdominal surgery. Electronically Signed   By: Nelson Chimes M.D.   On: 09/11/2017 06:57   Dg Chest Portable 1 View  Result Date: 09/10/2017 CLINICAL DATA:  Central line placement. EXAM: PORTABLE CHEST 1 VIEW COMPARISON:  CT chest 09/09/2017.  One-view chest x-ray 09/09/2017. FINDINGS: Heart is enlarged. Large hiatal hernia is again noted. The patient has been intubated. The endotracheal tube terminates less than 1 cm from the carina and could be pulled back to 3 cm for more optimal positioning. A left IJ line terminates above the aorta. Location is indeterminate. There is no pneumothorax. An NG tube is in place with decompression of the stomach and large hiatal hernia. Bilateral pleural effusions and basilar airspace disease are again noted. Pulmonary vascular congestion has increased. IMPRESSION: 1. Vascular catheter within the left-sided neck terminates above the aorta. Position is indeterminate. 2. Endotracheal tube terminates less than 1 cm from the carina. 3. Interval decompression of stomach enlarged hiatal hernia following placement of NG tube. 4. Bilateral pleural effusions and associated airspace disease, likely  atelectasis. These results were called by telephone at the time of interpretation on 09/10/2017 at 12:58 Pm to Dr. Romana Juniper , who verbally acknowledged these results. Electronically Signed   By: San Morelle  M.D.   On: 09/10/2017 13:04   Dg Chest Port 1 View  Result Date: 09/09/2017 CLINICAL DATA:  Follow-up shortness of breath history of a hiatal hernia. EXAM: PORTABLE CHEST 1 VIEW COMPARISON:  Chest x-ray of June 12, 2017 FINDINGS: There is marked dilation of a large diaphragmatic hernia containing gas-filled stomach and possibly bowel. A tubular structure overlies this region which could reflect a G-tube in the appropriate setting. There is bibasilar atelectasis adjacent to the hiatal hernia- intrathoracic stomach. There is no pleural effusion. The cardiac silhouette is enlarged. The pulmonary vascularity is not engorged. IMPRESSION: Subsegmental atelectasis adjacent to the large diaphragmatic hernia. There is gaseous distention of the stomach. There may be bowel loops above the hemidiaphragm as well. No discrete pneumonia. No CHF. Electronically Signed   By: David  Martinique M.D.   On: 09/09/2017 08:45   Dg Abd Portable 1v  Result Date: 09/09/2017 CLINICAL DATA:  Abdominal pain.  Hiatal hernia repair. EXAM: PORTABLE ABDOMEN - 1 VIEW COMPARISON:  07/28/2017. FINDINGS: Nonobstructive gas pattern. Gastrostomy tube LEFT upper quadrant. No worrisome osseous findings. IMPRESSION: As above. Electronically Signed   By: Staci Righter M.D.   On: 09/09/2017 18:37   Dg Esophagus W/water Sol Cm  Result Date: 09/09/2017 CLINICAL DATA:  Hiatal hernia. Recent gastrostomy tube placement. "Rule out leak." EXAM: ESOPHOGRAM/BARIUM SWALLOW TECHNIQUE: Single contrast examination was performed using water-soluble contrast. FLUOROSCOPY TIME:  Fluoroscopy Time:  1.0 minutes Radiation Exposure Index (if provided by the fluoroscopic device): 10.4 Number of Acquired Spot Images: 0 COMPARISON:  06/10/2017 and chest CT from 05/08/2017 FINDINGS: The patient tolerated taking 3 small sips of water soluble contrast, a total of about 20 cc. After that she refused all further imaging. She would only allow imaging in the supine position hunched  forward about 40 degrees by wedge pillows. On the swallows, the esophagus is noted to deviate laterally to the right before emptying into the very large intrathoracic hiatal hernia at projects over the central chest partially due to the patient positioning. There is some degree of esophageal dysmotility, as well as distal esophageal irregularity. Contrast surrounds the distal esophagus but is probably still within the very large hiatal hernia, which is distended with gas. I tilted the table up about 6 or 7 degrees, which is the most that the patient could tolerate without starting to slide. I was not able to get contrast to empty down into the intra- abdominal portion of the stomach. I am able to see a filling defect in the gastric bubble intra-abdominal portion compatible with the G-tube retention mechanism. IMPRESSION: 1. Today's exam does not exclude leak from the esophagus or stomach. The exam is simply too limited and the patient is not a suitable candidate for fluoroscopic imaging of this type. Esophagram requires the patient's ability to turn a different angles and swallow more than a sip or two of contrast, and this patient's required positioning today, sitting up about 40 degrees with respect to the plane of imaging, is particularly unsuitable to fluoroscopic diagnosis. 2. From what I can tell on the limited assessment achievable, the esophagus is bunched over to the right and empties into a very large hiatal hernia containing at least most of the stomach. The contrast fills along the posterior wall of the hiatal hernia. The  esophagus is irregular distally, and ulceration is not excluded. Chest CT might be an alternative although I am not certain that the patient will tolerate laying down for chest CT. Electronically Signed   By: Van Clines M.D.   On: 09/09/2017 11:25    Anti-infectives: Anti-infectives    Start     Dose/Rate Route Frequency Ordered Stop   09/11/17 1400  anidulafungin (ERAXIS)  100 mg in sodium chloride 0.9 % 100 mL IVPB     100 mg 78 mL/hr over 100 Minutes Intravenous Every 24 hours 09/10/17 1246     09/11/17 0000  piperacillin-tazobactam (ZOSYN) IVPB 2.25 g     2.25 g 100 mL/hr over 30 Minutes Intravenous Every 6 hours 09/10/17 1410     09/10/17 1430  linezolid (ZYVOX) IVPB 600 mg     600 mg 300 mL/hr over 60 Minutes Intravenous Every 12 hours 09/10/17 1414     09/10/17 1400  anidulafungin (ERAXIS) 200 mg in sodium chloride 0.9 % 200 mL IVPB     200 mg 78 mL/hr over 200 Minutes Intravenous  Once 09/10/17 1246 09/10/17 1651   09/10/17 1400  linezolid (ZYVOX) IVPB 600 mg  Status:  Discontinued     600 mg 300 mL/hr over 60 Minutes Intravenous Every 12 hours 09/10/17 1344 09/10/17 1414   09/09/17 1400  vancomycin (VANCOCIN) IVPB 1000 mg/200 mL premix  Status:  Discontinued     1,000 mg 200 mL/hr over 60 Minutes Intravenous Every 24 hours 09/09/17 1346 09/10/17 1344   09/09/17 1345  piperacillin-tazobactam (ZOSYN) IVPB 3.375 g  Status:  Discontinued     3.375 g 12.5 mL/hr over 240 Minutes Intravenous Every 8 hours 09/09/17 1344 09/10/17 1410   09/08/17 1000  vancomycin (VANCOCIN) IVPB 1000 mg/200 mL premix     1,000 mg 200 mL/hr over 60 Minutes Intravenous  Once 09/08/17 0946 09/08/17 1100   09/08/17 0950  ceFAZolin (ANCEF) 2-4 GM/100ML-% IVPB    Comments:  Bridget Hartshorn   : cabinet override      09/08/17 0950 09/08/17 1040   09/08/17 0947  vancomycin (VANCOCIN) 1-5 GM/200ML-% IVPB  Status:  Discontinued    Comments:  Bridget Hartshorn   : cabinet override      09/08/17 0947 09/08/17 0956   09/08/17 0941  vancomycin (VANCOCIN) 1-5 GM/200ML-% IVPB    Comments:  [place]   : cabinet override      09/08/17 0941 09/08/17 1000   09/08/17 0810  ceFAZolin (ANCEF) IVPB 2g/100 mL premix     2 g 200 mL/hr over 30 Minutes Intravenous On call to O.R. 09/08/17 0811 09/08/17 1050      Assessment/Plan: s/p Procedure(s): Exploratory laparotomy with takedown of G  tube, repair gastrostomy abdominal washout (N/A) NG tube needs to be to suction, strict NPO. Continue broad spectrum ABX. Start damp to dry dressing changes to midline wound today.  Very much appreciate assistance from PCCM.  LOS: 2 days    Danielle [name] 09/11/2017

## 2017-09-11 NOTE — Progress Notes (Signed)
Called by [facility] for second pediatric bottle now returning positive for yeast (collected 09/09/17 at 1434). Patient already on Anidulafungin. Notified Dr. Megan Salon with ID service.    Lindell Spar, PharmD, BCPS Pager: [phone] 09/11/2017 7:56 PM

## 2017-09-11 NOTE — Progress Notes (Signed)
CRITICAL VALUE ALERT  Critical Value:  Calcium 6  Date & Time Notied:  09/11/2017 0745  Provider Notified: Dr. Romana Juniper.  Left message with answering service  Orders Received/Actions taken: See orders.

## 2017-09-11 NOTE — Progress Notes (Signed)
Pt extubated pt to 4L [place] per Dr. Halford Chessman. Pt stable. HR 131, SAT 99%, RR 29. Ventilator left in the room for PRN BIPAP. RT will continue to monitor.

## 2017-09-11 NOTE — Progress Notes (Signed)
Pt had hypoglycemic event around 1700.  Admin half amp of d50 and started d5ns at 131mL per order from Dr. Halford Chessman. Pt glucose recovered.   Irven [place], RN

## 2017-09-11 NOTE — Procedures (Signed)
Extubation Procedure Note  Patient Details:   Name: Danielle [name] DOB: [id number] MRN: [id number]   Airway Documentation:  Airway 7.5 mm (Active)  Secured at (cm) 20 cm 09/11/2017  8:35 AM  Measured From Lips 09/11/2017  8:35 AM  [place] 09/11/2017  8:35 AM  Secured By [employer] 09/11/2017  8:35 AM  Tube Holder Repositioned Yes 09/11/2017  8:35 AM  Cuff Pressure (cm H2O) 25 cm H2O 09/11/2017  8:35 AM  Site Condition Dry 09/11/2017  8:35 AM    Evaluation  O2 sats: stable throughout Complications: No apparent complications Patient did tolerate procedure well. Bilateral Breath Sounds: Clear   Yes  Elsie Stain 09/11/2017, 12:23 PM

## 2017-09-11 NOTE — Progress Notes (Signed)
PULMONARY / CRITICAL CARE MEDICINE   Name: Danielle [name] MRN: [id number] DOB: 02/06/1946    ADMISSION DATE:  09/08/2017   CONSULTATION DATE:  09/09/2017  REFERRING MD:  Eleonore Chiquito, M.D. / [facility]  CHIEF COMPLAINT:  Hypotension  HISTORY OF PRESENT ILLNESS:   71 yo female former smoker admitted for surgical intervention of hiatal hernia.  She had laparoscopic gastropexy, lysis of adhesions, and G tube placement.  Developed septic shock and VDRF post op from pneumoperitoneum with purulent peritonitis with concern for G tube leak.  PMHx of Crohn's disease, Psoriasis.  SUBJECTIVE: Tolerating SBT.  Denies chest pain.  VITAL SIGNS: BP (!) 98/47   Pulse (!) 125   Temp 98.3 F (36.8 C) (Oral)   Resp (!) 0   Ht 5' 4.5" (1.638 m)   Wt 133 lb (60.3 kg)   SpO2 100%   BMI 21.96 kg/m   HEMODYNAMICS: CVP:  [18 mmHg] 18 mmHg  VENTILATOR SETTINGS: Vent Mode: CPAP;PSV FiO2 (%):  [40 %-60 %] 40 % Set Rate:  [24 bmp] 24 bmp Vt Set:  [450 mL] 450 mL PEEP:  [5 cmH20-8 cmH20] 5 cmH20 Pressure Support:  [5 cmH20] 5 cmH20 Plateau Pressure:  [20 cmH20-25 cmH20] 22 cmH20  INTAKE / OUTPUT: I/O last 3 completed shifts: In: 6398.8 [I.V.:5388.8; IV Piggyback:1010] Out: 2290 [Urine:1550; Drains:540; Blood:200]  PHYSICAL EXAMINATION:  General - alert Eyes - pupils reactive ENT - ETT, NG in place Cardiac - regular, tachycardic, no murmur Chest - no wheeze, rales Abd - wound site clean Ext - 1+ edema Skin - no rashes Neuro - follows commands  LABS:  BMET  Recent Labs Lab 09/09/17 0517 09/10/17 0420 09/10/17 0851 09/10/17 1111 09/10/17 1214 09/11/17 0509  NA 135  --  139 140 144 140  K 4.1  --  4.3 4.4 3.7 3.3*  CL 113*  --  118*  --   --  114*  CO2 14*  --  11*  --   --  17*  BUN 20  --  33*  --   --  30*  CREATININE 1.32* 2.87* 2.98*  --   --  2.46*  GLUCOSE 163*  --  93  --   --  171*    Electrolytes  Recent Labs Lab 09/09/17 0517 09/10/17 0851 09/11/17 0509   CALCIUM 8.1* 6.9* 6.0*  MG  --  1.1* 1.0*  PHOS  --  6.1* 4.1    CBC  Recent Labs Lab 09/09/17 0529 09/10/17 0851 09/10/17 1111 09/10/17 1214 09/11/17 0509  WBC 35.4* 50.4*  --   --  23.4*  HGB 12.9 10.1* 8.2* 7.8* 7.7*  HCT 40.3 30.9* 24.0* 23.0* 23.0*  PLT 264 209  --   --  140*    Coag's No results for input(s): APTT, INR in the last 168 hours.  Sepsis Markers  Recent Labs Lab 09/09/17 1409 09/09/17 1800 09/10/17 0420 09/11/17 0509  LATICACIDVEN 4.3* 1.8  --   --   PROCALCITON  --  25.35 57.49 52.02    ABG  Recent Labs Lab 09/10/17 1214 09/10/17 1416 09/11/17 0355  PHART 7.212* 7.268* 7.289*  PCO2ART 34.2 32.4 32.1  PO2ART 184.0* 123* 145*    Liver Enzymes  Recent Labs Lab 09/10/17 0851 09/11/17 0509  AST 48* 45*  ALT 21 29  ALKPHOS 91 93  BILITOT 0.6 0.7  ALBUMIN 2.4* 1.8*    Cardiac Enzymes  Recent Labs Lab 09/09/17 1800 09/10/17 0003 09/10/17 0337  TROPONINI <0.03 <  0.03 <0.03    Glucose  Recent Labs Lab 09/10/17 1545 09/10/17 1927 09/10/17 2050 09/10/17 2306 09/11/17 0313 09/11/17 0747  GLUCAP 98 68 181* 109* 90 86    Imaging Portable Chest Xray  Result Date: 09/11/2017 CLINICAL DATA:  Acute respiratory failure.  Ventilator support. EXAM: PORTABLE CHEST 1 VIEW COMPARISON:  09/10/2017.  09/09/2017. FINDINGS: Endotracheal tube is 1 cm above the carina. Nasogastric tube enters the abdomen. Right arm PICC tip is in the right atrium. Surgical drain tubes overlie the chest and abdomen. Persistent atelectasis/infiltrate in both lower lobes. IMPRESSION: Lines and tubes well positioned. Mild lower lobe atelectasis/infiltrate following thoracic and abdominal surgery. Electronically Signed   By: Nelson Chimes M.D.   On: 09/11/2017 06:57   Dg Chest Portable 1 View  Result Date: 09/10/2017 CLINICAL DATA:  Central line placement. EXAM: PORTABLE CHEST 1 VIEW COMPARISON:  CT chest 09/09/2017.  One-view chest x-ray 09/09/2017. FINDINGS:  Heart is enlarged. Large hiatal hernia is again noted. The patient has been intubated. The endotracheal tube terminates less than 1 cm from the carina and could be pulled back to 3 cm for more optimal positioning. A left IJ line terminates above the aorta. Location is indeterminate. There is no pneumothorax. An NG tube is in place with decompression of the stomach and large hiatal hernia. Bilateral pleural effusions and basilar airspace disease are again noted. Pulmonary vascular congestion has increased. IMPRESSION: 1. Vascular catheter within the left-sided neck terminates above the aorta. Position is indeterminate. 2. Endotracheal tube terminates less than 1 cm from the carina. 3. Interval decompression of stomach enlarged hiatal hernia following placement of NG tube. 4. Bilateral pleural effusions and associated airspace disease, likely atelectasis. These results were called by telephone at the time of interpretation on 09/10/2017 at 12:58 Pm to Dr. Romana Juniper , who verbally acknowledged these results. Electronically Signed   By: San Morelle M.D.   On: 09/10/2017 13:04    STUDIES:  CTA CHEST 10/4 >> large HH, compressive ATX, small b/l effusions CT ABD/PELVIS W/O 10/4 >>air/fluid collection mid to lower mediastinum  MICROBIOLOGY: MRSA PCR 9/27 >>>  Positive  Blood Cultures x2 10/4 >>> Urine 10/5 >>>  ANTIBIOTICS: Ancef 10/3 (periop) Vancomycin 10/3 >>> Zosyn 10/4 >>> Anidulafungin 10/5 >>>  SIGNIFICANT EVENTS: 10/03 Admit w/ lap placement of gastrostomy tube & gastropexy with lysis of adhesions 10/04 PCCM consult for hypotension post-op  10/05 Emergent Ex Lap 10/06 Extubated  LINES/TUBES: ETT 10/05 >> 10/06  Rt PICC 10/05 >> Femoral A line 10/05 >>  ASSESSMENT / PLAN:  Acute hypoxic respiratory failure in setting of peritonitis. - extubate 10/06  Septic shock from peritonitis. - wean pressors to keep MAP > 65 - continue IV fluids - continue Abx, antifungal  medications - f/u Echo  [facility] with peritionitis after surgery. Hx of Crohn's disease. - post op care, nutrition per CCS  Acute renal failure 2nd to ATN >> baseline creatinine 1.26 from 09/02/17. - f/u BMET  Hyperglycemia. - SSI  Acute metabolic encephalopathy. - prn fentanyl for pain control after extubation  DVT prophylaxis - SQ heparin SUP - Protonix Nutrition - NPO Goals of care - Full code  CC time 33 minutes  Chesley Mires, MD [place] 09/11/2017, 11:52 AM Pager:  [phone] After 3pm call: [phone]

## 2017-09-11 NOTE — Progress Notes (Signed)
Ca 6.0.  Corrected for albumin 1.8 is 7.8.  Still low.  Adding 2 g Ca Gluc and 25% albumin 25 gm q6 x 8 doses.

## 2017-09-12 DIAGNOSIS — B49 Unspecified mycosis: Secondary | ICD-10-CM

## 2017-09-12 DIAGNOSIS — N170 Acute kidney failure with tubular necrosis: Secondary | ICD-10-CM

## 2017-09-12 LAB — CBC
HCT: 30.1 % — ABNORMAL LOW (ref 36.0–46.0)
HEMATOCRIT: 20.3 % — AB (ref 36.0–46.0)
HEMOGLOBIN: 10.2 g/dL — AB (ref 12.0–15.0)
Hemoglobin: 7 g/dL — ABNORMAL LOW (ref 12.0–15.0)
MCH: 29.8 pg (ref 26.0–34.0)
MCH: 29.1 pg (ref 26.0–34.0)
MCHC: 34.5 g/dL (ref 30.0–36.0)
MCHC: 33.9 g/dL (ref 30.0–36.0)
MCV: 86.4 fL (ref 78.0–100.0)
MCV: 86 fL (ref 78.0–100.0)
Platelets: 142 10*3/uL — ABNORMAL LOW (ref 150–400)
Platelets: 112 10*3/uL — ABNORMAL LOW (ref 150–400)
RBC: 2.35 MIL/uL — ABNORMAL LOW (ref 3.87–5.11)
RBC: 3.5 MIL/uL — AB (ref 3.87–5.11)
RDW: 16.3 % — ABNORMAL HIGH (ref 11.5–15.5)
RDW: 15.5 % (ref 11.5–15.5)
WBC: 19 10*3/uL — ABNORMAL HIGH (ref 4.0–10.5)
WBC: 17.7 10*3/uL — ABNORMAL HIGH (ref 4.0–10.5)

## 2017-09-12 LAB — GLUCOSE, CAPILLARY
GLUCOSE-CAPILLARY: 33 mg/dL — AB (ref 65–99)
GLUCOSE-CAPILLARY: 63 mg/dL — AB (ref 65–99)
GLUCOSE-CAPILLARY: 69 mg/dL (ref 65–99)
GLUCOSE-CAPILLARY: 81 mg/dL (ref 65–99)
Glucose-Capillary: 82 mg/dL (ref 65–99)
Glucose-Capillary: 90 mg/dL (ref 65–99)
Glucose-Capillary: 56 mg/dL — ABNORMAL LOW (ref 65–99)
Glucose-Capillary: 78 mg/dL (ref 65–99)
Glucose-Capillary: 84 mg/dL (ref 65–99)
Glucose-Capillary: 105 mg/dL — ABNORMAL HIGH (ref 65–99)

## 2017-09-12 LAB — BASIC METABOLIC PANEL
Anion gap: 8 (ref 5–15)
BUN: 23 mg/dL — AB (ref 6–20)
CO2: 18 mmol/L — ABNORMAL LOW (ref 22–32)
CREATININE: 2.13 mg/dL — AB (ref 0.44–1.00)
Calcium: 7.3 mg/dL — ABNORMAL LOW (ref 8.9–10.3)
Chloride: 121 mmol/L — ABNORMAL HIGH (ref 101–111)
GFR calc Af Amer: 26 mL/min — ABNORMAL LOW (ref >60)
GFR calc non Af Amer: 23 mL/min — ABNORMAL LOW (ref >60)
GLUCOSE: 106 mg/dL — AB (ref 65–99)
Potassium: 3.7 mmol/L (ref 3.5–5.1)
SODIUM: 147 mmol/L — AB (ref 135–145)

## 2017-09-12 LAB — PREPARE RBC (CROSSMATCH)

## 2017-09-12 LAB — MAGNESIUM: MAGNESIUM: 2 mg/dL (ref 1.7–2.4)

## 2017-09-12 LAB — PHOSPHORUS: Phosphorus: 3.8 mg/dL (ref 2.5–4.6)

## 2017-09-12 MED ORDER — DEXTROSE 50 % IV SOLN
25.0000 mL | Freq: Once | INTRAVENOUS | Status: AC
  Administered 2017-09-12 – 2017-09-13 (×2): 25 mL via INTRAVENOUS

## 2017-09-12 MED ORDER — CHLORHEXIDINE GLUCONATE CLOTH 2 % EX PADS
6.0000 | MEDICATED_PAD | Freq: Every day | CUTANEOUS | Status: DC
  Administered 2017-09-12 – 2017-09-25 (×12): 6 via TOPICAL

## 2017-09-12 MED ORDER — DEXTROSE 50 % IV SOLN: 25.0000 mL | Freq: Once | INTRAVENOUS | Status: AC

## 2017-09-12 MED ORDER — SODIUM CHLORIDE 0.9 % IV SOLN
Freq: Once | INTRAVENOUS | Status: AC
  Administered 2017-09-12: 09:00:00 via INTRAVENOUS

## 2017-09-12 MED ORDER — DEXTROSE 50 % IV SOLN
25.0000 mL | Freq: Once | INTRAVENOUS | Status: AC
  Administered 2017-09-12: 25 mL via INTRAVENOUS
  Filled 2017-09-12: qty 50

## 2017-09-12 MED ORDER — DEXTROSE 50 % IV SOLN
INTRAVENOUS | Status: AC
  Administered 2017-09-12: 25 mL via INTRAVENOUS
  Filled 2017-09-12: qty 50

## 2017-09-12 NOTE — Progress Notes (Signed)
Lab unable to draw blood cultures after 3 attempts.

## 2017-09-12 NOTE — Progress Notes (Signed)
Patient ID: Danielle [name], female   DOB: [birth year], 71 y.o.   MRN: [id number]          [facility]    Date of Admission:  09/08/2017   Total days of antibiotics 5        Day 4 piperacillin tazobactam        Day 3 linezolid        Day 3 anidulafungin  Ms. Peil is resting quietly in bed. Both blood cultures are now growing Candida glabrata. This species of Candida is frequently resistant to fluconazole so I will continue anidulafungin. I would also continue broader antibacterial therapy for her peritonitis.         Michel Bickers, MD [facility] [phone] pager   [phone] cell 09/12/2017, 11:00 AM

## 2017-09-12 NOTE — Progress Notes (Signed)
PULMONARY / CRITICAL CARE MEDICINE   Name: Danielle [name] MRN: [id number] DOB: [birth year]    ADMISSION DATE:  09/08/2017   CONSULTATION DATE:  09/09/2017  REFERRING MD:  Eleonore Chiquito, M.D. / [facility]  CHIEF COMPLAINT:  Hypotension  HISTORY OF PRESENT ILLNESS:   71 yo female former smoker admitted for surgical intervention of hiatal hernia.  She had laparoscopic gastropexy, lysis of adhesions, and G tube placement.  Developed septic shock and VDRF post op from pneumoperitoneum with purulent peritonitis with concern for G tube leak.  PMHx of Crohn's disease, Psoriasis.  SUBJECTIVE: Getting frequent pain medications overnight.  Off pressors.  VITAL SIGNS: BP (!) 98/47   Pulse (!) 111   Temp 98.2 F (36.8 C) (Oral)   Resp (!) 29   Ht 5' 4.5" (1.638 m)   Wt 133 lb (60.3 kg)   SpO2 94%   BMI 21.96 kg/m   INTAKE / OUTPUT: I/O last 3 completed shifts: In: 3658.9 [I.V.:1978.9; IV Piggyback:1680] Out: 4259 [Urine:2950; Emesis/NG output:300; Drains:295]  PHYSICAL EXAMINATION:  General - anxious Eyes - pupils reactive ENT - NG tube in place Cardiac - regular, tachycardic, no murmur Chest - no wheeze, rales Abd - wound dressing clean Ext - 1+ edema Skin - no rashes Neuro - follows commands  LABS:  BMET  Recent Labs Lab 09/10/17 0851  09/10/17 1214 09/11/17 0509 09/12/17 0415  NA 139  < > 144 140 147*  K 4.3  < > 3.7 3.3* 3.7  CL 118*  --   --  114* 121*  CO2 11*  --   --  17* 18*  BUN 33*  --   --  30* 23*  CREATININE 2.98*  --   --  2.46* 2.13*  GLUCOSE 93  --   --  171* 106*  < > = values in this interval not displayed.  Electrolytes  Recent Labs Lab 09/10/17 0851 09/11/17 0509 09/12/17 0415  CALCIUM 6.9* 6.0* 7.3*  MG 1.1* 1.0* 2.0  PHOS 6.1* 4.1 3.8    CBC  Recent Labs Lab 09/10/17 0851  09/10/17 1214 09/11/17 0509 09/12/17 0415  WBC 50.4*  --   --  23.4* 19.0*  HGB 10.1*  < > 7.8* 7.7* 7.0*  HCT 30.9*  < > 23.0* 23.0* 20.3*  PLT 209  --   --   140* 142*  < > = values in this interval not displayed.  Coag's No results for input(s): APTT, INR in the last 168 hours.  Sepsis Markers  Recent Labs Lab 09/09/17 1409 09/09/17 1800 09/10/17 0420 09/11/17 0509  LATICACIDVEN 4.3* 1.8  --   --   PROCALCITON  --  25.35 57.49 52.02    ABG  Recent Labs Lab 09/10/17 1214 09/10/17 1416 09/11/17 0355  PHART 7.212* 7.268* 7.289*  PCO2ART 34.2 32.4 32.1  PO2ART 184.0* 123* 145*    Liver Enzymes  Recent Labs Lab 09/10/17 0851 09/11/17 0509  AST 48* 45*  ALT 21 29  ALKPHOS 91 93  BILITOT 0.6 0.7  ALBUMIN 2.4* 1.8*    Cardiac Enzymes  Recent Labs Lab 09/09/17 1800 09/10/17 0003 09/10/17 0337  TROPONINI <0.03 <0.03 <0.03    Glucose  Recent Labs Lab 09/11/17 1716 09/11/17 1803 09/11/17 1940 09/11/17 2334 09/12/17 0333 09/12/17 0736  GLUCAP 58* 91 95 82 90 81    Imaging No results found.  STUDIES:  CT angio chest 10/4 >> large HH, compressive ATX, small b/l effusions CT abd/pelvis 10/4 >>air/fluid collection mid  to lower mediastinum Echo 10/06 >> EF 65 to 70%, grade 1 DD, PAS 34 mmHg  MICROBIOLOGY: MRSA PCR 9/27 >>>  Positive  Blood Cultures x2 10/4 >>> Candida glabrata Urine 10/5 >>> negative  ANTIBIOTICS: Ancef 10/3 (periop) Vancomycin 10/3 >>> Zosyn 10/4 >>> Anidulafungin 10/5 >>>  SIGNIFICANT EVENTS: 10/03 Admit w/ lap placement of gastrostomy tube & gastropexy with lysis of adhesions 10/04 PCCM consult for hypotension post-op  10/05 Emergent Ex Lap 10/06 Extubated 10/07 Off pressors  LINES/TUBES: ETT 10/05 >> 10/06  Rt PICC 10/05 >> Femoral A line 10/05 >> 10/07  ASSESSMENT / PLAN:  Acute hypoxic respiratory failure in setting of peritonitis. - extubated 10/07 - bronchial hygiene - mobilize when able  Septic shock from peritonitis and Candida glabrata fungemia. - off pressors 10/07 - continue IV fluids - Abx, antifungal per ID  HH with peritionitis after surgery. Hx  of Crohn's disease. - post op care per CCS - nutrition per [facility]CS >> likely best to avoid TNA given fungemia  Acute renal failure 2nd to ATN >> baseline creatinine 1.26 from 09/02/17. - continue IV fluid - f/u BMET - monitor urine outpt  Hypoglycemia. - continue dextrose in IV fluids  Post op pain control. - prn fentanyl - if she continues to need frequent pain medication, then consider transitioning to PCA  DVT prophylaxis - SQ heparin SUP - Protonix Nutrition - NPO Goals of care - Full code  Chesley Mires, MD [place] 09/12/2017, 8:14 AM Pager:  [phone] After 3pm call: [phone]

## 2017-09-12 NOTE — Progress Notes (Signed)
[facility] notified of hemoglobin of 7.0 on AM labs. Waiting for response from DR.

## 2017-09-12 NOTE — Progress Notes (Signed)
2 Days Post-Op   Subjective/Chief Complaint: Extubated.  Weaned off levophed.  Still having pain.     Objective: Vital signs in last 24 hours: Temp:  [98.2 F (36.8 C)-98.6 F (37 C)] 98.4 F (36.9 C) (10/07 0800) Pulse Rate:  [111-132] 111 (10/07 0600) Resp:  [0-29] 29 (10/06 1200) SpO2:  [94 %-100 %] 94 % (10/07 0600) Arterial Line BP: (92-131)/(44-73) 112/53 (10/07 0600) Last BM Date: 09/08/17  Intake/Output from previous day: 10/06 0701 - 10/07 0700 In: 2813.1 [I.V.:1433.1; IV Piggyback:1380] Out: 2780 [Urine:2300; Emesis/NG output:300; Drains:180] Intake/Output this shift: No intake/output data recorded.  General appearance: sleepy, looks uncomfortable Resp.  A little tachypneic, but good respiratory effort.   GI: soft, appropriately tender. JP drains with serosanquinous output. Midline wound pink and viable, no abnormal drainage. NG tube with bilious output in tubing. Extremities: extremities warm, diffuse peripheral edema Skin: Skin color, texture, turgor normal. No rashes or lesions Incision/Wound: wet to dry, open wound with healthy tissue  Lab Results:   Recent Labs  09/11/17 0509 09/12/17 0415  WBC 23.4* 19.0*  HGB 7.7* 7.0*  HCT 23.0* 20.3*  PLT 140* 142*   BMET  Recent Labs  09/11/17 0509 09/12/17 0415  NA 140 147*  K 3.3* 3.7  CL 114* 121*  CO2 17* 18*  GLUCOSE 171* 106*  BUN 30* 23*  CREATININE 2.46* 2.13*  CALCIUM 6.0* 7.3*   PT/INR No results for input(s): LABPROT, INR in the last 72 hours. ABG  Recent Labs  09/10/17 1416 09/10/17 1418 09/11/17 0355  PHART 7.268*  --  7.289*  HCO3 14.3* 16.0* 14.7*    Studies/Results: Portable Chest Xray  Result Date: 09/11/2017 CLINICAL DATA:  Acute respiratory failure.  Ventilator support. EXAM: PORTABLE CHEST 1 VIEW COMPARISON:  09/10/2017.  09/09/2017. FINDINGS: Endotracheal tube is 1 cm above the carina. Nasogastric tube enters the abdomen. Right arm PICC tip is in the right atrium.  Surgical drain tubes overlie the chest and abdomen. Persistent atelectasis/infiltrate in both lower lobes. IMPRESSION: Lines and tubes well positioned. Mild lower lobe atelectasis/infiltrate following thoracic and abdominal surgery. Electronically Signed   By: Nelson Chimes M.D.   On: 09/11/2017 06:57   Dg Chest Portable 1 View  Result Date: 09/10/2017 CLINICAL DATA:  Central line placement. EXAM: PORTABLE CHEST 1 VIEW COMPARISON:  CT chest 09/09/2017.  One-view chest x-ray 09/09/2017. FINDINGS: Heart is enlarged. Large hiatal hernia is again noted. The patient has been intubated. The endotracheal tube terminates less than 1 cm from the carina and could be pulled back to 3 cm for more optimal positioning. A left IJ line terminates above the aorta. Location is indeterminate. There is no pneumothorax. An NG tube is in place with decompression of the stomach and large hiatal hernia. Bilateral pleural effusions and basilar airspace disease are again noted. Pulmonary vascular congestion has increased. IMPRESSION: 1. Vascular catheter within the left-sided neck terminates above the aorta. Position is indeterminate. 2. Endotracheal tube terminates less than 1 cm from the carina. 3. Interval decompression of stomach enlarged hiatal hernia following placement of NG tube. 4. Bilateral pleural effusions and associated airspace disease, likely atelectasis. These results were called by telephone at the time of interpretation on 09/10/2017 at 12:58 Pm to Dr. Romana Juniper , who verbally acknowledged these results. Electronically Signed   By: San Morelle M.D.   On: 09/10/2017 13:04    Anti-infectives: Anti-infectives    Start     Dose/Rate Route Frequency Ordered Stop   09/12/17 1500  anidulafungin (ERAXIS) 100 mg in sodium chloride 0.9 % 100 mL IVPB  Status:  Discontinued     100 mg 78 mL/hr over 100 Minutes Intravenous Every 24 hours 09/11/17 1448 09/11/17 1500   09/11/17 1500  anidulafungin (ERAXIS) 200 mg  in sodium chloride 0.9 % 200 mL IVPB  Status:  Discontinued     200 mg 78 mL/hr over 200 Minutes Intravenous  Once 09/11/17 1448 09/11/17 1500   09/11/17 1400  anidulafungin (ERAXIS) 100 mg in sodium chloride 0.9 % 100 mL IVPB     100 mg 78 mL/hr over 100 Minutes Intravenous Every 24 hours 09/10/17 1246     09/11/17 0000  piperacillin-tazobactam (ZOSYN) IVPB 2.25 g     2.25 g 100 mL/hr over 30 Minutes Intravenous Every 6 hours 09/10/17 1410     09/10/17 1430  linezolid (ZYVOX) IVPB 600 mg     600 mg 300 mL/hr over 60 Minutes Intravenous Every 12 hours 09/10/17 1414     09/10/17 1400  anidulafungin (ERAXIS) 200 mg in sodium chloride 0.9 % 200 mL IVPB     200 mg 78 mL/hr over 200 Minutes Intravenous  Once 09/10/17 1246 09/10/17 1651   09/10/17 1400  linezolid (ZYVOX) IVPB 600 mg  Status:  Discontinued     600 mg 300 mL/hr over 60 Minutes Intravenous Every 12 hours 09/10/17 1344 09/10/17 1414   09/09/17 1400  vancomycin (VANCOCIN) IVPB 1000 mg/200 mL premix  Status:  Discontinued     1,000 mg 200 mL/hr over 60 Minutes Intravenous Every 24 hours 09/09/17 1346 09/10/17 1344   09/09/17 1345  piperacillin-tazobactam (ZOSYN) IVPB 3.375 g  Status:  Discontinued     3.375 g 12.5 mL/hr over 240 Minutes Intravenous Every 8 hours 09/09/17 1344 09/10/17 1410   09/08/17 1000  vancomycin (VANCOCIN) IVPB 1000 mg/200 mL premix     1,000 mg 200 mL/hr over 60 Minutes Intravenous  Once 09/08/17 0946 09/08/17 1100   09/08/17 0950  ceFAZolin (ANCEF) 2-4 GM/100ML-% IVPB    Comments:  Bridget Hartshorn   : cabinet override      09/08/17 0950 09/08/17 1040   09/08/17 0947  vancomycin (VANCOCIN) 1-5 GM/200ML-% IVPB  Status:  Discontinued    Comments:  Bridget Hartshorn   : cabinet override      09/08/17 0947 09/08/17 0956   09/08/17 0941  vancomycin (VANCOCIN) 1-5 GM/200ML-% IVPB    Comments:  [place]   : cabinet override      09/08/17 0941 09/08/17 1000   09/08/17 0810  ceFAZolin (ANCEF) IVPB 2g/100 mL  premix     2 g 200 mL/hr over 30 Minutes Intravenous On call to O.R. 09/08/17 0811 09/08/17 1050      Assessment/Plan: s/p Procedure(s): Exploratory laparotomy with takedown of G tube, repair gastrostomy abdominal washout (N/A) Pulmonary toilet. Acute blood loss anemia- transfuse. Hypomagnesemia, hypokalemia, and hypocalcemia repleted.   Pain control with PRN fentanyl Strict NPO Possible TNA tomorrow if not more awake.   Broad spectrum antibiotics.      LOS: 3 days    Eulanda Dorion 09/12/2017

## 2017-09-13 DIAGNOSIS — N179 Acute kidney failure, unspecified: Secondary | ICD-10-CM

## 2017-09-13 DIAGNOSIS — K659 Peritonitis, unspecified: Secondary | ICD-10-CM

## 2017-09-13 DIAGNOSIS — J95821 Acute postprocedural respiratory failure: Secondary | ICD-10-CM

## 2017-09-13 DIAGNOSIS — R601 Generalized edema: Secondary | ICD-10-CM

## 2017-09-13 DIAGNOSIS — G9340 Encephalopathy, unspecified: Secondary | ICD-10-CM

## 2017-09-13 DIAGNOSIS — N183 Chronic kidney disease, stage 3 (moderate): Secondary | ICD-10-CM

## 2017-09-13 LAB — BLOOD GAS, ARTERIAL
ACID-BASE DEFICIT: 9.5 mmol/L — AB (ref 0.0–2.0)
BICARBONATE: 15.9 mmol/L — AB (ref 20.0–28.0)
DRAWN BY: 441261
O2 CONTENT: 2 L/min
O2 Saturation: 97.5 %
PATIENT TEMPERATURE: 37
PH ART: 7.283 — AB (ref 7.350–7.450)
pCO2 arterial: 34.7 mmHg (ref 32.0–48.0)
pO2, Arterial: 109 mmHg — ABNORMAL HIGH (ref 83.0–108.0)

## 2017-09-13 LAB — COMPREHENSIVE METABOLIC PANEL
ALBUMIN: 3.2 g/dL — AB (ref 3.5–5.0)
ALK PHOS: 75 U/L (ref 38–126)
ALT: 14 U/L (ref 14–54)
AST: 19 U/L (ref 15–41)
Anion gap: 9 (ref 5–15)
BILIRUBIN TOTAL: 1.2 mg/dL (ref 0.3–1.2)
BUN: 14 mg/dL (ref 6–20)
CALCIUM: 7.5 mg/dL — AB (ref 8.9–10.3)
CO2: 19 mmol/L — AB (ref 22–32)
Chloride: 121 mmol/L — ABNORMAL HIGH (ref 101–111)
Creatinine, Ser: 1.76 mg/dL — ABNORMAL HIGH (ref 0.44–1.00)
GFR calc Af Amer: 33 mL/min — ABNORMAL LOW (ref >60)
GFR calc non Af Amer: 28 mL/min — ABNORMAL LOW (ref >60)
GLUCOSE: 104 mg/dL — AB (ref 65–99)
Potassium: 3.3 mmol/L — ABNORMAL LOW (ref 3.5–5.1)
SODIUM: 149 mmol/L — AB (ref 135–145)
TOTAL PROTEIN: 5.7 g/dL — AB (ref 6.5–8.1)

## 2017-09-13 LAB — GLUCOSE, CAPILLARY
GLUCOSE-CAPILLARY: 91 mg/dL (ref 65–99)
GLUCOSE-CAPILLARY: 91 mg/dL (ref 65–99)
GLUCOSE-CAPILLARY: 74 mg/dL (ref 65–99)
Glucose-Capillary: 134 mg/dL — ABNORMAL HIGH (ref 65–99)
Glucose-Capillary: 66 mg/dL (ref 65–99)
Glucose-Capillary: 70 mg/dL (ref 65–99)
Glucose-Capillary: 80 mg/dL (ref 65–99)
Glucose-Capillary: 81 mg/dL (ref 65–99)

## 2017-09-13 LAB — TYPE AND SCREEN
ABO/RH(D): O POS
Antibody Screen: NEGATIVE
Unit division: 0
Unit division: 0

## 2017-09-13 LAB — CBC
HCT: 29.8 % — ABNORMAL LOW (ref 36.0–46.0)
Hemoglobin: 10.1 g/dL — ABNORMAL LOW (ref 12.0–15.0)
MCH: 29.2 pg (ref 26.0–34.0)
MCHC: 33.9 g/dL (ref 30.0–36.0)
MCV: 86.1 fL (ref 78.0–100.0)
PLATELETS: 100 10*3/uL — AB (ref 150–400)
RBC: 3.46 MIL/uL — ABNORMAL LOW (ref 3.87–5.11)
RDW: 16.2 % — ABNORMAL HIGH (ref 11.5–15.5)
WBC: 16 10*3/uL — ABNORMAL HIGH (ref 4.0–10.5)

## 2017-09-13 LAB — BPAM RBC
BLOOD PRODUCT EXPIRATION DATE: 201810252359
Blood Product Expiration Date: 201810252359
ISSUE DATE / TIME: 201810071212
ISSUE DATE / TIME: 201810071712
Unit Type and Rh: 5100
Unit Type and Rh: 5100

## 2017-09-13 LAB — MAGNESIUM: MAGNESIUM: 1.8 mg/dL (ref 1.7–2.4)

## 2017-09-13 LAB — PHOSPHORUS: PHOSPHORUS: 4 mg/dL (ref 2.5–4.6)

## 2017-09-13 MED ORDER — PIPERACILLIN-TAZOBACTAM 3.375 G IVPB
3.3750 g | Freq: Three times a day (TID) | INTRAVENOUS | Status: DC
  Administered 2017-09-13 – 2017-09-26 (×39): 3.375 g via INTRAVENOUS
  Filled 2017-09-13 (×38): qty 50

## 2017-09-13 MED ORDER — FUROSEMIDE 10 MG/ML IJ SOLN
20.0000 mg | Freq: Once | INTRAMUSCULAR | Status: AC
  Administered 2017-09-13: 20 mg via INTRAVENOUS
  Filled 2017-09-13: qty 2

## 2017-09-13 MED ORDER — POTASSIUM CHLORIDE 10 MEQ/100ML IV SOLN
10.0000 meq | INTRAVENOUS | Status: AC
  Administered 2017-09-13 (×5): 10 meq via INTRAVENOUS
  Filled 2017-09-13 (×5): qty 100

## 2017-09-13 MED ORDER — PNEUMOCOCCAL VAC POLYVALENT 25 MCG/0.5ML IJ INJ
0.5000 mL | INJECTION | INTRAMUSCULAR | Status: AC
  Administered 2017-09-17: 0.5 mL via INTRAMUSCULAR
  Filled 2017-09-13 (×2): qty 0.5

## 2017-09-13 MED ORDER — DEXMEDETOMIDINE HCL IN NACL 200 MCG/50ML IV SOLN
0.4000 ug/kg/h | INTRAVENOUS | Status: DC
  Administered 2017-09-13: 0.4 ug/kg/h via INTRAVENOUS
  Filled 2017-09-13: qty 50

## 2017-09-13 MED ORDER — MAGNESIUM SULFATE 2 GM/50ML IV SOLN
2.0000 g | Freq: Once | INTRAVENOUS | Status: AC
Start: 2017-09-13 — End: 2017-09-13
  Administered 2017-09-13: 2 g via INTRAVENOUS
  Filled 2017-09-13: qty 50

## 2017-09-13 MED ORDER — DEXTROSE 50 % IV SOLN
INTRAVENOUS | Status: AC
  Administered 2017-09-13: 25 mL via INTRAVENOUS
  Filled 2017-09-13: qty 50

## 2017-09-13 MED ORDER — DEXTROSE 50 % IV SOLN
INTRAVENOUS | Status: AC
Start: 2017-09-13 — End: 2017-09-13
  Administered 2017-09-13: 25 g via INTRAVENOUS
  Filled 2017-09-13: qty 50

## 2017-09-13 MED ORDER — DEXTROSE 50 % IV SOLN
25.0000 g | Freq: Once | INTRAVENOUS | Status: AC
  Administered 2017-09-13: 25 g via INTRAVENOUS

## 2017-09-13 MED ORDER — MUPIROCIN 2 % EX OINT
1.0000 "application " | TOPICAL_OINTMENT | Freq: Two times a day (BID) | CUTANEOUS | Status: AC
  Administered 2017-09-14 – 2017-09-18 (×10): 1 via NASAL
  Filled 2017-09-13 (×2): qty 22

## 2017-09-13 NOTE — Progress Notes (Signed)
PULMONARY / CRITICAL CARE MEDICINE   Name: Danielle [name] MRN: [id number] DOB: 01/03/1946 PCP Iona Beard, MD LOS 4 as of 09/13/2017     ADMISSION DATE:  09/08/2017 CONSULTATION DATE:  09/09/17  REFERRING MD:  Darrick Meigs   71 yo female former smoker admitted for surgical intervention of hiatal hernia.  She had laparoscopic gastropexy, lysis of adhesions, and G tube placement.  Developed septic shock and VDRF post op from pneumoperitoneum with purulent peritonitis with concern for G tube leak.  PMHx of Crohn's disease, Psoriasis. STUDIES:  CT angio chest 10/4 >> large HH, compressive ATX, small b/l effusions CT abd/pelvis 10/4 >>air/fluid collection mid to lower mediastinum Echo 10/06 >> EF 65 to 70%, grade 1 DD, PAS 34 mmHg  MICROBIOLOGY: MRSA PCR 9/27 >>>  Positive  Blood Cultures x2 10/4 >>> Candida glabrata Urine 10/5 >>> negative  ANTIBIOTICS: Ancef 10/3 (periop) Vancomycin 10/3 >>> Zosyn 10/4 >>> Anidulafungin 10/5 >>>   LINES/TUBES: ETT 10/05 >> 10/06  Rt PICC 10/05 >> Femoral A line 10/05 >> 10/07   SIGNIFICANT EVENTS: 10/03 Admit w/ lap placement of gastrostomy tube & gastropexy with lysis of adhesions 10/04 PCCM consult for hypotension post-op  10/05 Emergent Ex Lap 10/06 Extubated 10/07 Off pressors. Needed lot of pain meds   SUBJECTIVE/OVERNIGHT/INTERVAL HX 10/8 - niece at bedside. Says patient constantly moaning and drowsy but arousable and awaere. No fever. 3rd spacing +. Remains off vent.       VITAL SIGNS: BP 110/69   Pulse 99   Temp 97.9 F (36.6 C) (Axillary)   Resp 11   Ht 5' 4.5" (1.638 m)   Wt 60.3 kg (133 lb)   SpO2 100%   BMI 21.96 kg/m   HEMODYNAMICS: CVP:  [17 mmHg] 17 mmHg  VENTILATOR SETTINGS:    INTAKE / OUTPUT: I/O last 3 completed shifts: In: 7250.6 [I.V.:3863.1; Blood:707.5; IV Piggyback:2680] Out: 3301 [Urine:2571; Emesis/NG output:500; Drains:230]     EXAM  General Appearance:    Looks chronic unwell   Head:    Normocephalic, without obvious abnormality, atraumatic  Eyes:    PERRL - yes but eyes closed, conjunctiva/corneas - clear      Ears:    Normal external ear canals, both ears  Nose:   NG tube - yes  Throat:  ETT TUBE - no , OG tube - no  Neck:   Supple,  No enlargement/tenderness/nodules     Lungs:     Clear to auscultation bilaterally, mild tachypnea  Chest wall:    No deformity  Heart:    S1 and S2 normal, no murmur, CVP - no.  Pressors - no  Abdomen:     Soft, no masses, no organomegaly  Genitalia:    Not done  Rectal:   not done  Extremities:   Extremities-  Intact but ededma ++     Skin:   Intact in exposed areas      Neurologic:   Sedation - none -> RASS - -1/-2 . Moves all 4s - yes but mutters. CAM-ICU - moaning and muttering but can answer questtions in mono syllables . Orientation - priobably not oriented       LABS  PULMONARY  Recent Labs Lab 09/10/17 1111 09/10/17 1214 09/10/17 1416 09/10/17 1418 09/11/17 0355  PHART 7.038* 7.212* 7.268*  --  7.289*  PCO2ART 35.9 34.2 32.4  --  32.1  PO2ART 183.0* 184.0* 123*  --  145*  HCO3 9.7* 13.8* 14.3* 16.0* 14.7*  TCO2 11* 15*  --   --   --  O2SAT 99.0 99.0 97.4 76.0  73.9 97.8    CBC  Recent Labs Lab 09/12/17 0415 09/12/17 2155 09/13/17 0500  HGB 7.0* 10.2* 10.1*  HCT 20.3* 30.1* 29.8*  WBC 19.0* 17.7* 16.0*  PLT 142* 112* 100*    COAGULATION No results for input(s): INR in the last 168 hours.  CARDIAC   Recent Labs Lab 09/09/17 0529 09/09/17 1800 09/10/17 0003 09/10/17 0337  TROPONINI <0.03 <0.03 <0.03 <0.03   No results for input(s): PROBNP in the last 168 hours.   CHEMISTRY  Recent Labs Lab 09/09/17 0517 09/10/17 0420 09/10/17 0851 09/10/17 1111 09/10/17 1214 09/11/17 0509 09/12/17 0415 09/13/17 0500  NA 135  --  139 140 144 140 147* 149*  K 4.1  --  4.3 4.4 3.7 3.3* 3.7 3.3*  CL 113*  --  118*  --   --  114* 121* 121*  CO2 14*  --  11*  --   --  17* 18* 19*   GLUCOSE 163*  --  93  --   --  171* 106* 104*  BUN 20  --  33*  --   --  30* 23* 14  CREATININE 1.32* 2.87* 2.98*  --   --  2.46* 2.13* 1.76*  CALCIUM 8.1*  --  6.9*  --   --  6.0* 7.3* 7.5*  MG  --   --  1.1*  --   --  1.0* 2.0 1.8  PHOS  --   --  6.1*  --   --  4.1 3.8 4.0   Estimated Creatinine Clearance: 26.6 mL/min (A) (by C-G formula based on SCr of 1.76 mg/dL (H)).   LIVER  Recent Labs Lab 09/10/17 0851 09/11/17 0509 09/13/17 0500  AST 48* 45* 19  ALT 21 29 14   ALKPHOS 91 93 75  BILITOT 0.6 0.7 1.2  PROT 5.9* 4.4* 5.7*  ALBUMIN 2.4* 1.8* 3.2*     INFECTIOUS  Recent Labs Lab 09/09/17 1409 09/09/17 1800 09/10/17 0420 09/11/17 0509  LATICACIDVEN 4.3* 1.8  --   --   PROCALCITON  --  25.35 57.49 52.02     ENDOCRINE CBG (last 3)   Recent Labs  09/13/17 0012 09/13/17 0311 09/13/17 0757  GLUCAP 134* 91 91         IMAGING x48h  - image(s) personally visualized  -   highlighted in bold No results found.     ASSESSMENT and PLAN  Respiratory failure, post-operative ([place]) reamins extubated 24h post extubation but delirium places her at risk fo reintubation Monitor closely  Encephalopathy acute Mild hypoactive delirium + ? Pain but RN says - not respnding to fentanyl  Pl,an precedex  Anasarca Lasix x 1 dose      FAMILY  - Updates: 09/13/2017 --> niece updated  - Inter-disciplinary family meet or Palliative Care meeting due by:  DAy 7. Current LOS is LOS 4 days  CODE STATUS    Code Status Orders        Start     Ordered   09/08/17 1538  Full code  Continuous     09/08/17 1537    Code Status History    Date Active Date Inactive Code Status Order ID Comments User Context   07/27/2017 10:20 PM 07/29/2017  7:22 PM Full Code [id number]  Elmarie Shiley, MD Inpatient   06/08/2017  7:54 PM 06/17/2017  5:44 PM Full Code [id number]  Norval Morton, MD ED   05/07/2017 12:32 AM 05/14/2017  8:29  PM Full Code [id number]  Ivor Costa, MD ED    04/21/2017 12:44 PM 04/29/2017  8:32 PM Full Code [id number]  Jill Alexanders, PA-C Inpatient   12/05/2015  3:12 PM 12/09/2015  6:13 PM Full Code [id number]  Robbie Lis, MD ED   01/26/2015  6:03 PM 02/03/2015  3:19 PM Full Code [id number]  Verlee Monte, MD ED   11/18/2014  8:42 PM 11/22/2014  6:28 PM Full Code [id number]  Debbe Odea, MD ED   07/09/2014  2:22 PM 07/10/2014  3:10 PM Full Code [id number]  Martinique, Peter M, MD Inpatient   07/08/2014  7:14 PM 07/09/2014  2:22 PM Full Code [id number]  Jacolyn Reedy, MD Inpatient   03/20/2014  4:38 PM 03/22/2014  3:21 PM Full Code [id number]  Geradine Girt, DO Inpatient   08/22/2013  1:53 PM 08/23/2013  9:13 PM Full Code [id number]  Janece Canterbury, MD Inpatient   07/12/2013  8:23 PM 07/16/2013  8:55 PM Full Code [id number]  Delfina Redwood, MD Inpatient   04/29/2013  5:22 AM 04/30/2013  4:26 PM Full Code [id number]  Rise Patience, MD Inpatient   02/24/2013  6:52 PM 02/27/2013  3:18 PM Full Code [id number]  Jonetta Osgood, MD Inpatient   12/15/2011 11:12 PM 12/18/2011 12:14 PM Full Code [id number]  Caryn Bee, RN Inpatient   12/10/2011  8:57 PM 12/14/2011  7:56 PM Full Code [id number]  Derinda Sis, RN ED   11/06/2011  9:30 PM 11/13/2011  5:09 PM Full Code [id number]  Pecola Leisure, RN Inpatient        DISPO Keep in ICU sdu      Dr. Brand Males, M.D., [facility].C.P Pulmonary and Critical Care Medicine Staff Physician [facility] Pager: [phone], If no answer or between  15:00h - 7:00h: call [phone]  09/13/2017 10:55 AM

## 2017-09-13 NOTE — Progress Notes (Signed)
Date:  September 13, 2017 Chart reviewed for concurrent status and case management needs.  Will continue to follow patient progress.  Discharge Planning: following for needs  Expected discharge date: [id number]  Jeannett Dekoning, BSN, RN3, CCM   [phone]  

## 2017-09-13 NOTE — Progress Notes (Signed)
Pharmacy Antibiotic Note  Danielle [name] is a 71 y.o. female admitted on 09/08/2017 with paraesophageal hiatal hernia s/p laparoscopic G-tube placement and gastropexy, LOA. Post-op ex lap reveals frank pus in abdomen. Patient's currently on broad abx for peritonitis and fungemia.  Today, 09/13/2017: - Day #4 Eraxis, zosyn and linezolid - afeb, wbc elevated but trending down - scr trending down 1.76 (crcl~26) - plts down 100 - LFTs wnl  Plan: - with improving scr, will change zosyn to 3.375 gm IV q8h (infuse over 4 hrs) - continue linezolid 600 mg IV q12h per MD. Monitor plts closely. - continue Eraxis 100 mg IV q24h per MD - monitor renal function closely __________________________  Height: 5' 4.5" (163.8 cm) Weight: 133 lb (60.3 kg) IBW/kg (Calculated) : 55.85  Temp (24hrs), Avg:98.2 F (36.8 C), Min:97.7 F (36.5 C), Max:98.7 F (37.1 C)   Recent Labs Lab 09/09/17 1409 09/09/17 1800 09/10/17 0420 09/10/17 0851 09/11/17 0509 09/12/17 0415 09/12/17 2155 09/13/17 0500  WBC  --   --   --  50.4* 23.4* 19.0* 17.7* 16.0*  CREATININE  --   --  2.87* 2.98* 2.46* 2.13*  --  1.76*  LATICACIDVEN 4.3* 1.8  --   --   --   --   --   --     Estimated Creatinine Clearance: 26.6 mL/min (A) (by C-G formula based on SCr of 1.76 mg/dL (H)).    Allergies  Allergen Reactions  . Zithromax [Azithromycin] Swelling and Other (See Comments)    Reaction:  Mouth swelling and blisters   . Aspirin Other (See Comments)    Reaction:  Stomach cramping   . Ibuprofen Other (See Comments)    Pt states that she prefers not to take this medication.   . Iron Nausea And Vomiting    FERROUS SULFATE  . Sulfa Antibiotics Nausea And Vomiting    Antimicrobials this admission: 10/3 Cefazolin x1 dose preop 10/3 Vanc x1 preop, then daily starting 10/4 >> 10/5 10/4 Zosyn >>  10/5 Zyvox >>  10/5 Eraxis >>  Dose adjustments this admission: --   Microbiology results: 10/4 BCx: 2/2 bottles w/ C  glabrata 10/5 MRSA PCR: pos 10/5 UCx: NGF No specimens taken from OR  Thank you for allowing pharmacy to be a part of this patient's care.  Dia Sitter, PharmD, BCPS 09/13/2017 10:29 AM

## 2017-09-13 NOTE — Assessment & Plan Note (Addendum)
Mild hypoactive delirium + but much better 09/15/2017   Pl,an Supportive care

## 2017-09-13 NOTE — Progress Notes (Signed)
3 Days Post-Op   Subjective/Chief Complaint: Pt con't off pressors, BP stable NGT in place   Objective: Vital signs in last 24 hours: Temp:  [97.7 F (36.5 C)-98.7 F (37.1 C)] 98 F (36.7 C) (10/08 0354) Pulse Rate:  [104-124] 104 (10/08 0500) Resp:  [0-30] 0 (10/08 0500) BP: (97-169)/(53-103) 101/67 (10/08 0500) SpO2:  [92 %-100 %] 100 % (10/08 0500) Arterial Line BP: (122)/(59) 122/59 (10/07 0800) Last BM Date: 09/08/17  Intake/Output from previous day: 10/07 0701 - 10/08 0700 In: 4437.5 [I.V.:2430; Blood:707.5; IV Piggyback:1300] Out: 8366 [QHUTM:5465; Emesis/NG output:400; Drains:160] Intake/Output this shift: No intake/output data recorded.  General appearance: alert and cooperative Cardio: tachy, RR GI: soft, approp ttp, ND, incision c/d/i, JP SS  Lab Results:   Recent Labs  09/12/17 2155 09/13/17 0500  WBC 17.7* 16.0*  HGB 10.2* 10.1*  HCT 30.1* 29.8*  PLT 112* 100*   BMET  Recent Labs  09/12/17 0415 09/13/17 0500  NA 147* 149*  K 3.7 3.3*  CL 121* 121*  CO2 18* 19*  GLUCOSE 106* 104*  BUN 23* 14  CREATININE 2.13* 1.76*  CALCIUM 7.3* 7.5*   PT/INR No results for input(s): LABPROT, INR in the last 72 hours. ABG  Recent Labs  09/10/17 1416 09/10/17 1418 09/11/17 0355  PHART 7.268*  --  7.289*  HCO3 14.3* 16.0* 14.7*    Studies/Results: No results found.  Anti-infectives: Anti-infectives    Start     Dose/Rate Route Frequency Ordered Stop   09/13/17 1200  piperacillin-tazobactam (ZOSYN) IVPB 3.375 g     3.375 g 12.5 mL/hr over 240 Minutes Intravenous Every 8 hours 09/13/17 0732     09/12/17 1500  anidulafungin (ERAXIS) 100 mg in sodium chloride 0.9 % 100 mL IVPB  Status:  Discontinued     100 mg 78 mL/hr over 100 Minutes Intravenous Every 24 hours 09/11/17 1448 09/11/17 1500   09/11/17 1500  anidulafungin (ERAXIS) 200 mg in sodium chloride 0.9 % 200 mL IVPB  Status:  Discontinued     200 mg 78 mL/hr over 200 Minutes Intravenous   Once 09/11/17 1448 09/11/17 1500   09/11/17 1400  anidulafungin (ERAXIS) 100 mg in sodium chloride 0.9 % 100 mL IVPB     100 mg 78 mL/hr over 100 Minutes Intravenous Every 24 hours 09/10/17 1246     09/11/17 0000  piperacillin-tazobactam (ZOSYN) IVPB 2.25 g  Status:  Discontinued     2.25 g 100 mL/hr over 30 Minutes Intravenous Every 6 hours 09/10/17 1410 09/13/17 0732   09/10/17 1430  linezolid (ZYVOX) IVPB 600 mg     600 mg 300 mL/hr over 60 Minutes Intravenous Every 12 hours 09/10/17 1414     09/10/17 1400  anidulafungin (ERAXIS) 200 mg in sodium chloride 0.9 % 200 mL IVPB     200 mg 78 mL/hr over 200 Minutes Intravenous  Once 09/10/17 1246 09/10/17 1651   09/10/17 1400  linezolid (ZYVOX) IVPB 600 mg  Status:  Discontinued     600 mg 300 mL/hr over 60 Minutes Intravenous Every 12 hours 09/10/17 1344 09/10/17 1414   09/09/17 1400  vancomycin (VANCOCIN) IVPB 1000 mg/200 mL premix  Status:  Discontinued     1,000 mg 200 mL/hr over 60 Minutes Intravenous Every 24 hours 09/09/17 1346 09/10/17 1344   09/09/17 1345  piperacillin-tazobactam (ZOSYN) IVPB 3.375 g  Status:  Discontinued     3.375 g 12.5 mL/hr over 240 Minutes Intravenous Every 8 hours 09/09/17 1344 09/10/17 1410  09/08/17 1000  vancomycin (VANCOCIN) IVPB 1000 mg/200 mL premix     1,000 mg 200 mL/hr over 60 Minutes Intravenous  Once 09/08/17 0946 09/08/17 1100   09/08/17 0950  ceFAZolin (ANCEF) 2-4 GM/100ML-% IVPB    Comments:  Bridget Hartshorn   : cabinet override      09/08/17 0950 09/08/17 1040   09/08/17 0947  vancomycin (VANCOCIN) 1-5 GM/200ML-% IVPB  Status:  Discontinued    Comments:  Bridget Hartshorn   : cabinet override      09/08/17 0947 09/08/17 0956   09/08/17 0941  vancomycin (VANCOCIN) 1-5 GM/200ML-% IVPB    Comments:  Dellie Catholic   : cabinet override      09/08/17 0941 09/08/17 1000   09/08/17 0810  ceFAZolin (ANCEF) IVPB 2g/100 mL premix     2 g 200 mL/hr over 30 Minutes Intravenous On call to O.R. 09/08/17  0811 09/08/17 1050      Assessment/Plan: s/p Procedure(s): Exploratory laparotomy with takedown of G tube, repair gastrostomy abdominal washout (N/A) Appreciate CCM and ID input. Replenish lytes Con't NGT for now.  May need TNA started early this week to supp nutrition Con't dressing changes Con't abx/antifungals  DVT prophylaxis - Heparin    LOS: 4 days    [name]., [facility] 09/13/2017

## 2017-09-13 NOTE — Progress Notes (Signed)
[place] [place] for Infectious Disease   Reason for visit: Follow up on pneumoperitoneum and purulent peritonitis  Interval History: now off of pressor support, WBC down to 16 from a peak of 50, remains afebrile; Total days of antibiotics 6 Day 5 pip-tazo Day 4 linezolid Day 4 anidulafungin   Physical Exam: Constitutional:  Vitals:   09/13/17 0900 09/13/17 1000  BP: (!) 99/56 110/69  Pulse: (!) 102 99  Resp: (!) 7 11  Temp:    SpO2: 100% 100%   patient appears in mild distress, moaning with each breath Eyes: anicteric HENT: +NGT Respiratory: Normal respiratory effort; CTA B, anterior exam Cardiovascular: tachy RR  Review of Systems: Unable to be assessed due to mental status  Lab Results  Component Value Date   WBC 16.0 (H) 09/13/2017   HGB 10.1 (L) 09/13/2017   HCT 29.8 (L) 09/13/2017   MCV 86.1 09/13/2017   PLT 100 (L) 09/13/2017    Lab Results  Component Value Date   CREATININE 1.76 (H) 09/13/2017   BUN 14 09/13/2017   NA 149 (H) 09/13/2017   K 3.3 (L) 09/13/2017   CL 121 (H) 09/13/2017   CO2 19 (L) 09/13/2017    Lab Results  Component Value Date   ALT 14 09/13/2017   AST 19 09/13/2017   ALKPHOS 75 09/13/2017     Microbiology: Recent Results (from the past 240 hour(s))  Culture, blood (Routine X 2) w Reflex to ID Panel     Status: Abnormal   Collection Time: 09/09/17  2:09 PM  Result Value Ref Range Status   Specimen Description BLOOD RIGHT ANTECUBITAL  Final   Special Requests IN PEDIATRIC BOTTLE Blood Culture adequate volume  Final   Culture  Setup Time   Final    BUDDING YEAST SEEN IN PEDIATRIC BOTTLE CRITICAL RESULT CALLED TO, READ BACK BY AND VERIFIED WITH: N GLOGOVAC,PHARMD AT 1652 09/11/17 BY L BENFIELD Performed at [facility], [place] [address] [place], [place] [address]    Culture CANDIDA GLABRATA (A)  Final   Report Status 09/13/2017 FINAL  Final  Blood Culture ID Panel (Reflexed)     Status: Abnormal   Collection Time:  09/09/17  2:09 PM  Result Value Ref Range Status   Enterococcus species NOT DETECTED NOT DETECTED Final   Listeria monocytogenes NOT DETECTED NOT DETECTED Final   Staphylococcus species NOT DETECTED NOT DETECTED Final   Staphylococcus aureus NOT DETECTED NOT DETECTED Final   Streptococcus species NOT DETECTED NOT DETECTED Final   Streptococcus agalactiae NOT DETECTED NOT DETECTED Final   Streptococcus pneumoniae NOT DETECTED NOT DETECTED Final   Streptococcus pyogenes NOT DETECTED NOT DETECTED Final   Acinetobacter baumannii NOT DETECTED NOT DETECTED Final   Enterobacteriaceae species NOT DETECTED NOT DETECTED Final   Enterobacter cloacae complex NOT DETECTED NOT DETECTED Final   Escherichia coli NOT DETECTED NOT DETECTED Final   Klebsiella oxytoca NOT DETECTED NOT DETECTED Final   Klebsiella pneumoniae NOT DETECTED NOT DETECTED Final   Proteus species NOT DETECTED NOT DETECTED Final   Serratia marcescens NOT DETECTED NOT DETECTED Final   Haemophilus influenzae NOT DETECTED NOT DETECTED Final   Neisseria meningitidis NOT DETECTED NOT DETECTED Final   Pseudomonas aeruginosa NOT DETECTED NOT DETECTED Final   Candida albicans NOT DETECTED NOT DETECTED Final   Candida glabrata DETECTED (A) NOT DETECTED Final    Comment: CRITICAL RESULT CALLED TO, READ BACK BY AND VERIFIED WITH: N GLOGOVAC,PHARMD AT 1652 09/11/17 BY L BENFIELD  Candida krusei NOT DETECTED NOT DETECTED Final   Candida parapsilosis NOT DETECTED NOT DETECTED Final   Candida tropicalis NOT DETECTED NOT DETECTED Final    Comment: Performed at [facility], [place] [address], [place], [place] [address]  Culture, blood (Routine X 2) w Reflex to ID Panel     Status: None (Preliminary result)   Collection Time: 09/09/17  2:34 PM  Result Value Ref Range Status   Specimen Description BLOOD RIGHT HAND  Final   Special Requests IN PEDIATRIC BOTTLE Blood Culture adequate volume  Final   Culture  Setup Time   Final    YEAST IN  PEDIATRIC BOTTLE CRITICAL RESULT CALLED TO, READ BACK BY AND VERIFIED WITH: J. GADHIA,PHARMD 1946 09/11/2017 T. TYSOR    Culture   Final    YEAST CULTURE REINCUBATED FOR BETTER GROWTH Performed at [facility], [place] [address] [place], [place] [address]    Report Status PENDING  Incomplete  Culture, Urine     Status: None   Collection Time: 09/10/17  1:49 PM  Result Value Ref Range Status   Specimen Description URINE, RANDOM  Final   Special Requests NONE  Final   Culture   Final    NO GROWTH Performed at [facility], [place] [address], [place],  [address]    Report Status 09/11/2017 FINAL  Final    Impression/Plan:  1. Fungemia - C glabrata in 2/2.  I will repeat her blood cultures tomorrow. Improved overall.  Continue with anidulafungin.  2. Peritonitis - on linezolid and pip/tazo as well as above.  No changes.    3.  Sepsis - now off of pressors, objective improvement with lower wbc, no fever.    4. Acute renal injury - creat improved, getting fluids.

## 2017-09-13 NOTE — Assessment & Plan Note (Addendum)
reamins extubated  post extubation but delirium, sepsis and volume ooverload and AKI places her at risk fo reintubation but seems issue are slowly improving/stabliizing on 09/15/2017 and risk for reitnubation next 48h has diminished   PLAN Monitor closely O2 for pulse ox > 88% Poor bipap candidate Lasix for pulm edema

## 2017-09-13 NOTE — Progress Notes (Signed)
Progress Progress Note Patient Name: Danielle [name] DOB: [birth year] MRN: [id number]   Date of Service  09/13/2017  HPI/Events of Note  External catheter not working - Skin breakdown on perineum. Request tor Foley Catheter.   eICU Interventions  Will order Foley catheter placed.      Intervention Category Intermediate Interventions: Other:  Sommer,Steven Cornelia Copa 09/13/2017, 10:50 PM

## 2017-09-13 NOTE — Assessment & Plan Note (Addendum)
Severe 3d spacing with hypernatremia   Plan chagne d10 at 75 to d5 half at kvo; reduce fluid load continjue lasix

## 2017-09-14 ENCOUNTER — Inpatient Hospital Stay (HOSPITAL_COMMUNITY): Payer: Medicare HMO

## 2017-09-14 DIAGNOSIS — D899 Disorder involving the immune mechanism, unspecified: Secondary | ICD-10-CM

## 2017-09-14 DIAGNOSIS — J95821 Acute postprocedural respiratory failure: Secondary | ICD-10-CM

## 2017-09-14 LAB — BASIC METABOLIC PANEL
ANION GAP: 9 (ref 5–15)
BUN: 11 mg/dL (ref 6–20)
CALCIUM: 7.3 mg/dL — AB (ref 8.9–10.3)
CO2: 17 mmol/L — ABNORMAL LOW (ref 22–32)
CREATININE: 1.59 mg/dL — AB (ref 0.44–1.00)
Chloride: 124 mmol/L — ABNORMAL HIGH (ref 101–111)
GFR, EST AFRICAN AMERICAN: 37 mL/min — AB (ref >60)
GFR, EST NON AFRICAN AMERICAN: 32 mL/min — AB (ref >60)
Glucose, Bld: 114 mg/dL — ABNORMAL HIGH (ref 65–99)
Potassium: 3.2 mmol/L — ABNORMAL LOW (ref 3.5–5.1)
SODIUM: 150 mmol/L — AB (ref 135–145)

## 2017-09-14 LAB — CULTURE, BLOOD (ROUTINE X 2): Special Requests: ADEQUATE

## 2017-09-14 LAB — CBC WITH DIFFERENTIAL/PLATELET
BASOS ABS: 0 10*3/uL (ref 0.0–0.1)
BASOS PCT: 0 %
EOS ABS: 0.2 10*3/uL (ref 0.0–0.7)
Eosinophils Relative: 2 %
HCT: 31.4 % — ABNORMAL LOW (ref 36.0–46.0)
Hemoglobin: 10.4 g/dL — ABNORMAL LOW (ref 12.0–15.0)
Lymphocytes Relative: 16 %
Lymphs Abs: 2.4 10*3/uL (ref 0.7–4.0)
MCH: 28.4 pg (ref 26.0–34.0)
MCHC: 33.1 g/dL (ref 30.0–36.0)
MCV: 85.8 fL (ref 78.0–100.0)
MONO ABS: 1.2 10*3/uL — AB (ref 0.1–1.0)
MONOS PCT: 8 %
NEUTROS PCT: 75 %
Neutro Abs: 11.4 10*3/uL — ABNORMAL HIGH (ref 1.7–7.7)
PLATELETS: 83 10*3/uL — AB (ref 150–400)
RBC: 3.66 MIL/uL — ABNORMAL LOW (ref 3.87–5.11)
RDW: 16.7 % — AB (ref 11.5–15.5)
WBC: 15.2 10*3/uL — ABNORMAL HIGH (ref 4.0–10.5)

## 2017-09-14 LAB — PHOSPHORUS: PHOSPHORUS: 3.5 mg/dL (ref 2.5–4.6)

## 2017-09-14 LAB — GLUCOSE, CAPILLARY
GLUCOSE-CAPILLARY: 102 mg/dL — AB (ref 65–99)
GLUCOSE-CAPILLARY: 86 mg/dL (ref 65–99)
GLUCOSE-CAPILLARY: 74 mg/dL (ref 65–99)
GLUCOSE-CAPILLARY: 75 mg/dL (ref 65–99)
GLUCOSE-CAPILLARY: 112 mg/dL — AB (ref 65–99)
GLUCOSE-CAPILLARY: 101 mg/dL — AB (ref 65–99)
Glucose-Capillary: 26 mg/dL — CL (ref 65–99)
Glucose-Capillary: 55 mg/dL — ABNORMAL LOW (ref 65–99)
Glucose-Capillary: 77 mg/dL (ref 65–99)
Glucose-Capillary: 117 mg/dL — ABNORMAL HIGH (ref 65–99)

## 2017-09-14 LAB — MAGNESIUM: MAGNESIUM: 1.8 mg/dL (ref 1.7–2.4)

## 2017-09-14 MED ORDER — VITAL HIGH PROTEIN PO LIQD: 1000.0000 mL | ORAL | Status: DC

## 2017-09-14 MED ORDER — FUROSEMIDE 10 MG/ML IJ SOLN
40.0000 mg | Freq: Two times a day (BID) | INTRAMUSCULAR | Status: DC
  Administered 2017-09-14 – 2017-09-16 (×6): 40 mg via INTRAVENOUS
  Filled 2017-09-14 (×6): qty 4

## 2017-09-14 MED ORDER — POTASSIUM CHLORIDE 10 MEQ/50ML IV SOLN
10.0000 meq | INTRAVENOUS | Status: AC
  Administered 2017-09-14 (×6): 10 meq via INTRAVENOUS
  Filled 2017-09-14 (×6): qty 50

## 2017-09-14 MED ORDER — OSMOLITE 1.2 CAL PO LIQD
1000.0000 mL | ORAL | Status: DC
Start: 2017-09-14 — End: 2017-09-16
  Administered 2017-09-14 – 2017-09-16 (×4): 1000 mL

## 2017-09-14 MED ORDER — DEXTROSE 10 % IV SOLN
INTRAVENOUS | Status: DC
  Administered 2017-09-14: 03:00:00 via INTRAVENOUS
  Filled 2017-09-14 (×3): qty 1000

## 2017-09-14 MED ORDER — POTASSIUM CHLORIDE 10 MEQ/100ML IV SOLN: 10.0000 meq | INTRAVENOUS | Status: DC

## 2017-09-14 MED ORDER — MAGNESIUM SULFATE 2 GM/50ML IV SOLN
2.0000 g | Freq: Once | INTRAVENOUS | Status: AC
  Administered 2017-09-14: 2 g via INTRAVENOUS
  Filled 2017-09-14: qty 50

## 2017-09-14 NOTE — Assessment & Plan Note (Signed)
correct 

## 2017-09-14 NOTE — Progress Notes (Signed)
Initial Nutrition Assessment  DOCUMENTATION CODES:   Not applicable  INTERVENTION:  - Will order Osmolite 1.2 @ 10 mL/hr to increase by 10 mL every 12 hours to reach goal rate of Osmolite 1.2 @ 60 mL/hr. At goal rate, this regimen will provide 1728 kcal, 80 grams of protein, and 1181 mL free water.   Monitor magnesium, potassium, and phosphorus daily for at least 3 days, MD to replete as needed, as pt is at risk for refeeding syndrome given no nutrition x5 days, unsure of nutrition hx, current hypokalemia.  NUTRITION DIAGNOSIS:   Inadequate oral intake related to inability to eat as evidenced by NPO status. -ongoing  GOAL:   Patient will meet greater than or equal to 90% of their needs -unmet/unable to meet at this time.   MONITOR:   TF tolerance, Weight trends, Labs, I & O's  REASON FOR ASSESSMENT:   Consult Enteral/tube feeding initiation and management (Pt likely to start TFs w/in 24-48hrs)  ASSESSMENT:   Pt with PMH significant for Crohn's disease, hiatal hernia, CKD III, and SBO. Presents this admission s/p exloratory laparotomy that revealed frank pus in abdomen resulting in septic shock. See significant events below.    10/3: laparoscopic hernia repair/placement of G-tube and gastropexy, lysis of adhesions 10/4: Pt transferred to ICU stepdown 10/5: ex lap (reveals frank pus), takedown of gastrostomy and repair of gastrotomy, washout and placement of mediastinal drains, hepatorrhaphy x 2, intubated, PICC placed 10/6: extubated  10/9 No family/visitors present at this time and pt sleeping soundly. NGT in place. Consult received, as outlined above, and TF order outlined. Refeeding risk. No updated weight since 10/3. Estimated nutrition needs updated based on extubation.   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 Surgeon note from today states "if bowel gas  pattern seems appropriate, may start TFs today." Dr. Golden Pop note from this AM states risk for need for re-intubation and that pt is a poor BiPAP candidate.   Medications reviewed; 20 mg IV Lasix x1 dose yesterday, 40 mg IV Lasix BID starting today, sliding scale Novolog, 2 g IV Mg sulfate x1 run today, 40 mg IV Protonix/day, 10 mEq IV KCl x5 runs yesterday and x6 runs today. Labs reviewed; CBGs: 86, 74, and 77 mg/dL today, Na: 150 mmol/L, K: 3.2 mmol/L, Cl: 124 mmol/L, creatinine: 1.59 mg/dL, Ca: 7.3 mg/dL, GFR: 37 mL/min.   IVF: D10 @ 75 mL/hr (612 kcal from dextrose).   10/5 Patient is currently intubated on ventilator support MV: 10.9 L/min Temp (24hrs), Avg:98 F (36.7 C), Min:97.5 F (36.4 C), Max:98.6 F (37 C) Propofol: None BP: 117/49 MAP: 67  - No family at bedside to obtain nutrition history.  - Weight noted to be increasing from 06/2017.   Nutrition-Focused physical exam completed. Findings are no fat depletion, no muscle depletion, and mild edema. Fluid accumulation may be masking losses.    Diet Order:  Diet NPO time specified  Skin:   (multiple closed incisions abdomen)  Last BM:  10/9  Height:   Ht Readings from Last 1 Encounters:  09/10/17 5' 4.5" (1.638 m)    Weight:   Wt Readings from Last 1 Encounters:  09/08/17 133 lb (60.3 kg)    Ideal Body Weight:  54.5 kg  BMI:  Body mass index is 21.96 kg/m.  Estimated Nutritional Needs:   Kcal:  1690-1930 (28-32 kcal/kg)  Protein:  72-84 grams (1.2-1.4 grams/kg)  Fluid:  >/= 1.8 L/day  EDUCATION NEEDS:   No education needs identified at this time    Jarome Matin, MS, RD, LDN, [facility] Inpatient Clinical Dietitian Pager # [phone] After hours/weekend pager # [phone]

## 2017-09-14 NOTE — Progress Notes (Signed)
[place] for Infectious Disease   Reason for visit: Follow up on pneumoperitoneum and purulent peritonitis  Interval History: now off of pressor support, WBC down to 15 today, from a peak of 50, remains afebrile; Total days of antibiotics 7 Day 6 pip-tazo Day 5 linezolid Day 5 anidulafungin   Physical Exam: Constitutional:  Vitals:   09/14/17 1100 09/14/17 1200  BP: 108/71 97/66  Pulse: 97 95  Resp: 12 15  Temp:    SpO2: 100% 100%   patient appears in mild distress, moaning with each breath again Eyes: anicteric Respiratory: Normal respiratory effort; CTA B, anterior exam Cardiovascular: tachy RR  Review of Systems: Unable to be assessed due to mental status  Lab Results  Component Value Date   WBC 15.2 (H) 09/14/2017   HGB 10.4 (L) 09/14/2017   HCT 31.4 (L) 09/14/2017   MCV 85.8 09/14/2017   PLT 83 (L) 09/14/2017    Lab Results  Component Value Date   CREATININE 1.59 (H) 09/14/2017   BUN 11 09/14/2017   NA 150 (H) 09/14/2017   K 3.2 (L) 09/14/2017   CL 124 (H) 09/14/2017   CO2 17 (L) 09/14/2017    Lab Results  Component Value Date   ALT 14 09/13/2017   AST 19 09/13/2017   ALKPHOS 75 09/13/2017     Microbiology: Recent Results (from the past 240 hour(s))  Culture, blood (Routine X 2) w Reflex to ID Panel     Status: Abnormal   Collection Time: 09/09/17  2:09 PM  Result Value Ref Range Status   Specimen Description BLOOD RIGHT ANTECUBITAL  Final   Special Requests IN PEDIATRIC BOTTLE Blood Culture adequate volume  Final   Culture  Setup Time   Final    BUDDING YEAST SEEN IN PEDIATRIC BOTTLE CRITICAL RESULT CALLED TO, READ BACK BY AND VERIFIED WITH: N GLOGOVAC,PHARMD AT 1652 09/11/17 BY L BENFIELD Performed at [facility], [place] [address] [place], [place] [address]    Culture CANDIDA GLABRATA (A)  Final   Report Status 09/13/2017 FINAL  Final  Blood Culture ID Panel (Reflexed)     Status: Abnormal   Collection Time: 09/09/17  2:09 PM   Result Value Ref Range Status   Enterococcus species NOT DETECTED NOT DETECTED Final   Listeria monocytogenes NOT DETECTED NOT DETECTED Final   Staphylococcus species NOT DETECTED NOT DETECTED Final   Staphylococcus aureus NOT DETECTED NOT DETECTED Final   Streptococcus species NOT DETECTED NOT DETECTED Final   Streptococcus agalactiae NOT DETECTED NOT DETECTED Final   Streptococcus pneumoniae NOT DETECTED NOT DETECTED Final   Streptococcus pyogenes NOT DETECTED NOT DETECTED Final   Acinetobacter baumannii NOT DETECTED NOT DETECTED Final   Enterobacteriaceae species NOT DETECTED NOT DETECTED Final   Enterobacter cloacae complex NOT DETECTED NOT DETECTED Final   Escherichia coli NOT DETECTED NOT DETECTED Final   Klebsiella oxytoca NOT DETECTED NOT DETECTED Final   Klebsiella pneumoniae NOT DETECTED NOT DETECTED Final   Proteus species NOT DETECTED NOT DETECTED Final   Serratia marcescens NOT DETECTED NOT DETECTED Final   Haemophilus influenzae NOT DETECTED NOT DETECTED Final   Neisseria meningitidis NOT DETECTED NOT DETECTED Final   Pseudomonas aeruginosa NOT DETECTED NOT DETECTED Final   Candida albicans NOT DETECTED NOT DETECTED Final   Candida glabrata DETECTED (A) NOT DETECTED Final    Comment: CRITICAL RESULT CALLED TO, READ BACK BY AND VERIFIED WITH: N GLOGOVAC,PHARMD AT 1652 09/11/17 BY L BENFIELD  Candida krusei NOT DETECTED NOT DETECTED Final   Candida parapsilosis NOT DETECTED NOT DETECTED Final   Candida tropicalis NOT DETECTED NOT DETECTED Final    Comment: Performed at [facility], [place] [address], [place], [place] [address]  Culture, blood (Routine X 2) w Reflex to ID Panel     Status: Abnormal   Collection Time: 09/09/17  2:34 PM  Result Value Ref Range Status   Specimen Description BLOOD RIGHT HAND  Final   Special Requests IN PEDIATRIC BOTTLE Blood Culture adequate volume  Final   Culture  Setup Time   Final    YEAST IN PEDIATRIC BOTTLE CRITICAL RESULT  CALLED TO, READ BACK BY AND VERIFIED WITH: J. GADHIA,PHARMD 1946 09/11/2017 T. TYSOR    Culture CANDIDA GLABRATA (A)  Final   Report Status 09/14/2017 FINAL  Final  Culture, Urine     Status: None   Collection Time: 09/10/17  1:49 PM  Result Value Ref Range Status   Specimen Description URINE, RANDOM  Final   Special Requests NONE  Final   Culture   Final    NO GROWTH Performed at [facility], [place] [address] [place], [place] [address]    Report Status 09/11/2017 FINAL  Final    Impression/Plan:  1. Fungemia - C glabrata in 2/2.  I will repeat her blood cultures tomorrow. Improved overall.  Continue with anidulafungin.  2. Peritonitis - on linezolid and pip/tazo as well as above.  No changes.    3.  Sepsis -  Continues off of pressors  4. Acute renal injury - creat improved again to 1.59, getting fluids.

## 2017-09-14 NOTE — Consult Note (Signed)
   [facility] Inpatient Consult   97/08/8920  DALINA SAMARA [birth year] [id number]    Screened for potential [facility] Care Management services due to multiple hospital admissions.  Chart reviewed. Currently in ICU/SDU.  Will continue to follow and engage if/when appropriate for [facility] Care Management program.    Marthenia Rolling, MSN-Ed, RN,BSN [facility]iaison [phone]

## 2017-09-14 NOTE — Progress Notes (Signed)
Pt Danielle [name] still in place however bed is completed soaked with urine. Started to bath pt at this time and contacted Elink and received orders to place a foley. 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 Called back and notified Elink nurse at this time. Then began to place foley with charge nurse assistance and pt seems to have more problems than previously noticed. Took two attempts with 2 different foleys to get placed and still was very positional and difficlut to place. Seems to have a lot of interior obstruction. The flexiseal balloon was defelated during this time to help reduce any resistance during insertion. First foley, even though seem to go into urethra, must have been inserted into vagina for stool began to slwly come out during this time. Pt was recleaned and serilzed for second insertion and urethra was located for pt began to void during insertion. thiere was still a lot of resistance after minimal insertion length and just about time of giving up the pt shifted her hips and the foley inserted futher, urine was received in the tube and balloon inflated with 10 cc of NS.[name] Pt was cleansed once more post insertion. Will contact Elink and update of the situation and see if there are any further orders.

## 2017-09-14 NOTE — Progress Notes (Signed)
4 Days Post-Op   Subjective/Chief Complaint: Pt with some hypoglycemia overnight.  IVF changed.  Some break down on backside per RN.  Objective: Vital signs in last 24 hours: Temp:  [97.9 F (36.6 C)-99.6 F (37.6 C)] 99.6 F (37.6 C) (10/09 0314) Pulse Rate:  [86-117] 102 (10/09 0600) Resp:  [7-27] 15 (10/09 0600) BP: (80-124)/(44-84) 88/49 (10/09 0600) SpO2:  [84 %-100 %] 100 % (10/09 0600) Last BM Date: 09/13/17 (FMS in place)  Intake/Output from previous day: 10/08 0701 - 10/09 0700 In: 4505 [I.V.:3675; IV Piggyback:830] Out: 1885 [Urine:1300; Emesis/NG output:500; Drains:85] Intake/Output this shift: No intake/output data recorded.  General appearance: alert Resp: clear to auscultation bilaterally Cardio: tachy, RR GI: soft, non-tender; bowel sounds normal; no masses,  no organomegaly and incision c/d/i  Lab Results:   Recent Labs  09/13/17 0500 09/14/17 0347  WBC 16.0* 15.2*  HGB 10.1* 10.4*  HCT 29.8* 31.4*  PLT 100* 83*   BMET  Recent Labs  09/13/17 0500 09/14/17 0347  NA 149* 150*  K 3.3* 3.2*  CL 121* 124*  CO2 19* 17*  GLUCOSE 104* 114*  BUN 14 11  CREATININE 1.76* 1.59*  CALCIUM 7.5* 7.3*   PT/INR No results for input(s): LABPROT, INR in the last 72 hours. ABG  Recent Labs  09/13/17 1128  PHART 7.283*  HCO3 15.9*    Studies/Results: Dg Chest Port 1 View  Result Date: 09/14/2017 CLINICAL DATA:  Respiratory failure. EXAM: PORTABLE CHEST 1 VIEW COMPARISON:  09/11/2017. FINDINGS: Interim extubation. NG tube tip in the upper portion of the stomach. Right PICC line in unchanged position with tip in right atrium. Cardiomegaly. Diffuse bilateral pulmonary infiltrates consistent pulmonary edema noted on today's exam. Bilateral pleural effusions. Findings consistent CHF. No pneumothorax. IMPRESSION: 1. Interim extubation. NG tube tip noted in the upper portion stomach. Right PICC line tip noted in unchanged position in the right atrium. 2.  Cardiomegaly with diffuse pulmonary infiltrates consistent with pulmonary edema noted on today's exam. Bilateral pleural effusions. Electronically Signed   By: Marcello Moores  Register   On: 09/14/2017 06:13    Anti-infectives: Anti-infectives    Start     Dose/Rate Route Frequency Ordered Stop   09/13/17 1200  piperacillin-tazobactam (ZOSYN) IVPB 3.375 g     3.375 g 12.5 mL/hr over 240 Minutes Intravenous Every 8 hours 09/13/17 0732     09/12/17 1500  anidulafungin (ERAXIS) 100 mg in sodium chloride 0.9 % 100 mL IVPB  Status:  Discontinued     100 mg 78 mL/hr over 100 Minutes Intravenous Every 24 hours 09/11/17 1448 09/11/17 1500   09/11/17 1500  anidulafungin (ERAXIS) 200 mg in sodium chloride 0.9 % 200 mL IVPB  Status:  Discontinued     200 mg 78 mL/hr over 200 Minutes Intravenous  Once 09/11/17 1448 09/11/17 1500   09/11/17 1400  anidulafungin (ERAXIS) 100 mg in sodium chloride 0.9 % 100 mL IVPB     100 mg 78 mL/hr over 100 Minutes Intravenous Every 24 hours 09/10/17 1246     09/11/17 0000  piperacillin-tazobactam (ZOSYN) IVPB 2.25 g  Status:  Discontinued     2.25 g 100 mL/hr over 30 Minutes Intravenous Every 6 hours 09/10/17 1410 09/13/17 0732   09/10/17 1430  linezolid (ZYVOX) IVPB 600 mg     600 mg 300 mL/hr over 60 Minutes Intravenous Every 12 hours 09/10/17 1414     09/10/17 1400  anidulafungin (ERAXIS) 200 mg in sodium chloride 0.9 % 200 mL IVPB  200 mg 78 mL/hr over 200 Minutes Intravenous  Once 09/10/17 1246 09/10/17 1651   09/10/17 1400  linezolid (ZYVOX) IVPB 600 mg  Status:  Discontinued     600 mg 300 mL/hr over 60 Minutes Intravenous Every 12 hours 09/10/17 1344 09/10/17 1414   09/09/17 1400  vancomycin (VANCOCIN) IVPB 1000 mg/200 mL premix  Status:  Discontinued     1,000 mg 200 mL/hr over 60 Minutes Intravenous Every 24 hours 09/09/17 1346 09/10/17 1344   09/09/17 1345  piperacillin-tazobactam (ZOSYN) IVPB 3.375 g  Status:  Discontinued     3.375 g 12.5 mL/hr over  240 Minutes Intravenous Every 8 hours 09/09/17 1344 09/10/17 1410   09/08/17 1000  vancomycin (VANCOCIN) IVPB 1000 mg/200 mL premix     1,000 mg 200 mL/hr over 60 Minutes Intravenous  Once 09/08/17 0946 09/08/17 1100   09/08/17 0950  ceFAZolin (ANCEF) 2-4 GM/100ML-% IVPB    Comments:  Bridget Hartshorn   : cabinet override      09/08/17 0950 09/08/17 1040   09/08/17 0947  vancomycin (VANCOCIN) 1-5 GM/200ML-% IVPB  Status:  Discontinued    Comments:  Bridget Hartshorn   : cabinet override      09/08/17 0947 09/08/17 0956   09/08/17 0941  vancomycin (VANCOCIN) 1-5 GM/200ML-% IVPB    Comments:  Dellie Catholic   : cabinet override      09/08/17 0941 09/08/17 1000   09/08/17 0810  ceFAZolin (ANCEF) IVPB 2g/100 mL premix     2 g 200 mL/hr over 30 Minutes Intravenous On call to O.R. 09/08/17 0811 09/08/17 1050      Assessment/Plan: s/p Procedure(s): Exploratory laparotomy with takedown of G tube, repair gastrostomy abdominal washout (N/A) -replace lytes -will obtain NGT.  Pt is having BMs with flexiseal.  ?colovesical fistula. If bowel gas pattern seem appropriate may start TFs today. -Will consult wound RN for gluteal skin break down. -Con't abx/antifungal as per ID -s/w pt's sister, Ms. Rollene Rotunda and updated her.  LOS: 5 days    [name]., [facility] 09/14/2017

## 2017-09-14 NOTE — Assessment & Plan Note (Signed)
Per ccs 

## 2017-09-14 NOTE — Consult Note (Addendum)
[facility] Nurse wound consult note Reason for Consult: Consult requested for buttocks. Pt has been incontinent of liquid stool and Flexiseal has been inserted to attempt to contain stool.  There is still stool leaking around the insertion site. Wound type: Bilat butocks and inner groin are red and macerated with partial thickness skin loss to right inner buttock and gluteal fold; affected area is red and moist; 5X6X.1cm; appearance consistent with moisture associated skin damage. This is NOT a pressure injury. Dressing procedure/placement/frequency: Leave foam dressing off, since it would trap stool against skin.  Barrier cream to repel moisture and protect skin.  Pt is on a low air loss bed to reduce pressure. Please re-consult if further assistance is needed.  Thank-you,  Julien Girt MSN, [place], [place], [place], [place]

## 2017-09-14 NOTE — Assessment & Plan Note (Signed)
[facility] poor ICU prognosis

## 2017-09-14 NOTE — Assessment & Plan Note (Addendum)
Slowly better but hypernatremic and even Na better with free water  Plan Change d10 to d5 half normal saline and reduce rate to [facility] Continue lasix

## 2017-09-14 NOTE — Assessment & Plan Note (Signed)
Per id

## 2017-09-14 NOTE — Progress Notes (Signed)
PULMONARY / CRITICAL CARE MEDICINE   Name: Danielle [name] MRN: [id number] DOB: [birth year] PCP Iona Beard, MD LOS 5 as of 09/14/2017     ADMISSION DATE:  09/08/2017 CONSULTATION DATE:  09/09/17  REFERRING MD:  Darrick Meigs   71 yo female former smoker admitted for surgical intervention of hiatal hernia.  She had laparoscopic gastropexy, lysis of adhesions, and G tube placement.  Developed septic shock and VDRF post op from pneumoperitoneum with purulent peritonitis with concern for G tube leak.  PMHx of Crohn's disease, Psoriasis. STUDIES:  CT angio chest 10/4 >> large HH, compressive ATX, small b/l effusions CT abd/pelvis 10/4 >>air/fluid collection mid to lower mediastinum Echo 10/06 >> EF 65 to 70%, grade 1 DD, PAS 34 mmHg  MICROBIOLOGY: MRSA PCR 9/27 >>>  Positive  Blood Cultures x2 10/4 >>> Candida glabrata Urine 10/5 >>> negative  ANTIBIOTICS: Ancef 10/3 (periop) Vancomycin 10/3 >>> Zosyn 10/4 >>> Anidulafungin 10/5 >>>   LINES/TUBES: ETT 10/05 >> 10/06  Rt PICC 10/05 >> Femoral A line 10/05 >> 10/07   SIGNIFICANT EVENTS: 10/03 Admit w/ lap placement of gastrostomy tube & gastropexy with lysis of adhesions 10/04 PCCM consult for hypotension post-op  10/05 Emergent Ex Lap 10/06 Extubated 10/07 Off pressors. Needed lot of pain meds 10/8 - niece at bedside. Says patient constantly moaning and drowsy but arousable and awaere. No fever. 3rd spacing +. Remains off vent.    SUBJECTIVE/OVERNIGHT/INTERVAL HX 10/9./18 - not on vent, not on pressors. Stool noticed via vagina. Hypoactive delirium continues; CXR wet with lot of 3rd spacing. Severe hypoglycemia continues - needing high dose d10 drip, AKI better but Na 150     VITAL SIGNS: BP (!) 95/53   Pulse (!) 102   Temp 99.6 F (37.6 C) (Axillary)   Resp 17   Ht 5' 4.5" (1.638 m)   Wt 60.3 kg (133 lb)   SpO2 100%   BMI 21.96 kg/m   HEMODYNAMICS: CVP:  [14 mmHg] 14 mmHg  VENTILATOR SETTINGS:    INTAKE /  OUTPUT: I/O last 3 completed shifts: In: 6484.2 [I.V.:4686.7; Blood:317.5; IV Piggyback:1480] Out: 2835 [Urine:2000; Emesis/NG output:700; Drains:135]     EXAM   General Appearance:    Looks criticall ill  Head:    Normocephalic, without obvious abnormality, atraumatic  Eyes:    PERRL - yes, conjunctiva/corneas - clear      Ears:    Normal external ear canals, both ears  Nose:   NG tube - yes  Throat:  ETT TUBE - no , OG tube - no  Neck:   Supple,  No enlargement/tenderness/nodules     Lungs:     Clear to auscultation bilaterally,  Chest wall:    No deformity  Heart:    S1 and S2 normal, no murmur, CVP - 18.  Pressors - no  Abdomen:     Soft, no masses, no organomegaly  Genitalia:    Not done  Rectal:   not done  Extremities:   Extremities- edema ++     Skin:   Intact in exposed areas . Sacral area - unclear if there is decub     Neurologic:   Sedation - none -> RASS - -2 . Moves all 4s - yes per RN. CAM-ICU - confused . Orientation - not oriented, muttering          LABS  PULMONARY  Recent Labs Lab 09/10/17 1111 09/10/17 1214 09/10/17 1416 09/10/17 1418 09/11/17 0355 09/13/17 1128  PHART 7.038* 7.212*  7.268*  --  7.289* 7.283*  PCO2ART 35.9 34.2 32.4  --  32.1 34.7  PO2ART 183.0* 184.0* 123*  --  145* 109*  HCO3 9.7* 13.8* 14.3* 16.0* 14.7* 15.9*  TCO2 11* 15*  --   --   --   --   O2SAT 99.0 99.0 97.4 76.0  73.9 97.8 97.5    CBC  Recent Labs Lab 09/12/17 2155 09/13/17 0500 09/14/17 0347  HGB 10.2* 10.1* 10.4*  HCT 30.1* 29.8* 31.4*  WBC 17.7* 16.0* 15.2*  PLT 112* 100* 83*    COAGULATION No results for input(s): INR in the last 168 hours.  CARDIAC    Recent Labs Lab 09/09/17 0529 09/09/17 1800 09/10/17 0003 09/10/17 0337  TROPONINI <0.03 <0.03 <0.03 <0.03   No results for input(s): PROBNP in the last 168 hours.   CHEMISTRY  Recent Labs Lab 09/10/17 0851  09/10/17 1214 09/11/17 0509 09/12/17 0415 09/13/17 0500  09/14/17 0347  NA 139  < > 144 140 147* 149* 150*  K 4.3  < > 3.7 3.3* 3.7 3.3* 3.2*  CL 118*  --   --  114* 121* 121* 124*  CO2 11*  --   --  17* 18* 19* 17*  GLUCOSE 93  --   --  171* 106* 104* 114*  BUN 33*  --   --  30* 23* 14 11  CREATININE 2.98*  --   --  2.46* 2.13* 1.76* 1.59*  CALCIUM 6.9*  --   --  6.0* 7.3* 7.5* 7.3*  MG 1.1*  --   --  1.0* 2.0 1.8 1.8  PHOS 6.1*  --   --  4.1 3.8 4.0 3.5  < > = values in this interval not displayed. Estimated Creatinine Clearance: 29.5 mL/min (A) (by C-G formula based on SCr of 1.59 mg/dL (H)).   LIVER  Recent Labs Lab 09/10/17 0851 09/11/17 0509 09/13/17 0500  AST 48* 45* 19  ALT 21 29 14   ALKPHOS 91 93 75  BILITOT 0.6 0.7 1.2  PROT 5.9* 4.4* 5.7*  ALBUMIN 2.4* 1.8* 3.2*     INFECTIOUS  Recent Labs Lab 09/09/17 1409 09/09/17 1800 09/10/17 0420 09/11/17 0509  LATICACIDVEN 4.3* 1.8  --   --   PROCALCITON  --  25.35 57.49 52.02     ENDOCRINE CBG (last 3)   Recent Labs  09/14/17 0200 09/14/17 0313 09/14/17 0805  GLUCAP 86 74 77         IMAGING x48h  - image(s) personally visualized  -   highlighted in bold Dg Chest Port 1 View  Result Date: 09/14/2017 CLINICAL DATA:  Respiratory failure. EXAM: PORTABLE CHEST 1 VIEW COMPARISON:  09/11/2017. FINDINGS: Interim extubation. NG tube tip in the upper portion of the stomach. Right PICC line in unchanged position with tip in right atrium. Cardiomegaly. Diffuse bilateral pulmonary infiltrates consistent pulmonary edema noted on today's exam. Bilateral pleural effusions. Findings consistent CHF. No pneumothorax. IMPRESSION: 1. Interim extubation. NG tube tip noted in the upper portion stomach. Right PICC line tip noted in unchanged position in the right atrium. 2. Cardiomegaly with diffuse pulmonary infiltrates consistent with pulmonary edema noted on today's exam. Bilateral pleural effusions. Electronically Signed   By: Marcello Moores  Register   On: 09/14/2017 06:13        ASSESSMENT and PLAN  Respiratory failure, post-operative ([place]) reamins extubated 48h post extubation but delirium, sepsis and volume ooverload and AKI places her at risk fo reintubation  PLAN Monitor closely O2 for  pulse ox > 88% Poor bipap candidate Lasix for pulm edema  Encephalopathy acute Mild hypoactive delirium + ? 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 Current LOS is LOS 5 days  CODE STATUS    Code Status Orders        Start     Ordered   09/08/17 1538  Full code  Continuous     09/08/17 1537    Code Status History    Date Active Date Inactive Code Status Order ID Comments User Context   07/27/2017 10:20 PM 07/29/2017  7:22 PM Full Code [id number]  Elmarie Shiley, MD Inpatient   06/08/2017  7:54 PM 06/17/2017  5:44 PM Full Code [id number]  Norval Morton, MD ED   05/07/2017 12:32 AM 05/14/2017  8:29 PM Full Code [id number]  Ivor Costa, MD ED   04/21/2017 12:44 PM 04/29/2017  8:32 PM Full Code [id number]  Jill Alexanders, PA-C Inpatient   12/05/2015  3:12 PM 12/09/2015  6:13 PM Full Code [id number]  Robbie Lis, MD ED   01/26/2015  6:03 PM 02/03/2015  3:19 PM Full Code [id number]  Verlee Monte, MD ED   11/18/2014  8:42 PM 11/22/2014  6:28 PM Full Code [id number]  Debbe Odea, MD ED   07/09/2014  2:22 PM 07/10/2014  3:10 PM Full Code [id number]  Martinique, Peter M, MD Inpatient    07/08/2014  7:14 PM 07/09/2014  2:22 PM Full Code [id number]  Jacolyn Reedy, MD Inpatient   03/20/2014  4:38 PM 03/22/2014  3:21 PM Full Code [id number]  Geradine Girt, DO Inpatient   08/22/2013  1:53 PM 08/23/2013  9:13 PM Full Code [id number]  Janece Canterbury, MD Inpatient   07/12/2013  8:23 PM 07/16/2013  8:55 PM Full Code [id number]  Delfina Redwood, MD Inpatient   04/29/2013  5:22 AM 04/30/2013  4:26 PM Full Code [id number]  Rise Patience, MD Inpatient   02/24/2013  6:52 PM 02/27/2013  3:18 PM Full Code [id number]  Jonetta Osgood, MD Inpatient   12/15/2011 11:12 PM 12/18/2011 12:14 PM Full Code [id number]  Caryn Bee, RN Inpatient   12/10/2011  8:57 PM 12/14/2011  7:56 PM Full Code [id number]  Derinda Sis, RN ED   11/06/2011  9:30 PM 11/13/2011  5:09 PM Full Code [id number]  Pecola Leisure, RN Inpatient        DISPO Keep in ICU      The patient is critically ill with multiple organ systems failure and requires high complexity decision making for assessment and support, frequent evaluation and titration of therapies, application of advanced monitoring technologies and extensive interpretation of multiple databases.   Critical Care Time devoted to patient care services described in this note is  30  Minutes. This time reflects time of care of this signee Dr Brand Males. This critical care time does not reflect procedure time, or teaching time or  supervisory time of PA/NP/Med student/Med Resident etc but could involve care discussion time    Dr. Brand Males, M.D., [facility].C.P Pulmonary and Critical Care Medicine Staff Physician [facility] Pager: [phone], If no answer or between  15:00h - 7:00h: call [phone]  09/14/2017 9:52 AM

## 2017-09-14 NOTE — Assessment & Plan Note (Signed)
[facility] poor icu prognosis

## 2017-09-14 NOTE — Progress Notes (Signed)
[place] Progress Note Patient Name: Danielle [name] DOB: [birth year] MRN: [id number]   Date of Service  09/14/2017  HPI/Events of Note  Hypoglycemia - 2 episodes of hypoglycemia (70 and 66) on D5 0.9 NaCl at 100 Ml/hour. Na+ = 149 yesterday morning.   eICU Interventions  Will order: 1. D/C D5 0.9 NaCl IV infusion. 2. D10W to run IV at 75 mL/hour.      Intervention Category Major Interventions: Other:  Ziyah Cordoba Cornelia Copa 09/14/2017, 2:37 AM

## 2017-09-15 DIAGNOSIS — E878 Other disorders of electrolyte and fluid balance, not elsewhere classified: Secondary | ICD-10-CM

## 2017-09-15 LAB — BASIC METABOLIC PANEL
Anion gap: 10 (ref 5–15)
BUN: 12 mg/dL (ref 6–20)
CHLORIDE: 116 mmol/L — AB (ref 101–111)
CO2: 20 mmol/L — AB (ref 22–32)
CREATININE: 1.7 mg/dL — AB (ref 0.44–1.00)
Calcium: 7.7 mg/dL — ABNORMAL LOW (ref 8.9–10.3)
GFR calc non Af Amer: 30 mL/min — ABNORMAL LOW (ref >60)
GFR, EST AFRICAN AMERICAN: 34 mL/min — AB (ref >60)
GLUCOSE: 164 mg/dL — AB (ref 65–99)
Potassium: 2.9 mmol/L — ABNORMAL LOW (ref 3.5–5.1)
Sodium: 146 mmol/L — ABNORMAL HIGH (ref 135–145)

## 2017-09-15 LAB — GLUCOSE, CAPILLARY
GLUCOSE-CAPILLARY: 80 mg/dL (ref 65–99)
GLUCOSE-CAPILLARY: 138 mg/dL — AB (ref 65–99)
GLUCOSE-CAPILLARY: 113 mg/dL — AB (ref 65–99)
GLUCOSE-CAPILLARY: 85 mg/dL (ref 65–99)
Glucose-Capillary: 90 mg/dL (ref 65–99)
Glucose-Capillary: 91 mg/dL (ref 65–99)

## 2017-09-15 LAB — CBC WITH DIFFERENTIAL/PLATELET
BASOS ABS: 0 10*3/uL (ref 0.0–0.1)
BASOS PCT: 0 %
EOS ABS: 0.3 10*3/uL (ref 0.0–0.7)
EOS PCT: 2 %
HCT: 32.7 % — ABNORMAL LOW (ref 36.0–46.0)
Hemoglobin: 10.9 g/dL — ABNORMAL LOW (ref 12.0–15.0)
Lymphocytes Relative: 22 %
Lymphs Abs: 3.7 10*3/uL (ref 0.7–4.0)
MCH: 28.6 pg (ref 26.0–34.0)
MCHC: 33.3 g/dL (ref 30.0–36.0)
MCV: 85.8 fL (ref 78.0–100.0)
MONO ABS: 1.3 10*3/uL — AB (ref 0.1–1.0)
Monocytes Relative: 8 %
NEUTROS ABS: 11.3 10*3/uL — AB (ref 1.7–7.7)
Neutrophils Relative %: 68 %
Platelets: 69 10*3/uL — ABNORMAL LOW (ref 150–400)
RBC: 3.81 MIL/uL — ABNORMAL LOW (ref 3.87–5.11)
RDW: 16.7 % — AB (ref 11.5–15.5)
WBC: 16.6 10*3/uL — ABNORMAL HIGH (ref 4.0–10.5)

## 2017-09-15 LAB — MAGNESIUM: Magnesium: 1.5 mg/dL — ABNORMAL LOW (ref 1.7–2.4)

## 2017-09-15 LAB — PHOSPHORUS: PHOSPHORUS: 2.8 mg/dL (ref 2.5–4.6)

## 2017-09-15 MED ORDER — MAGNESIUM SULFATE 4 GM/100ML IV SOLN
4.0000 g | Freq: Once | INTRAVENOUS | Status: AC
  Administered 2017-09-15: 4 g via INTRAVENOUS
  Filled 2017-09-15: qty 100

## 2017-09-15 MED ORDER — DEXTROSE-NACL 5-0.45 % IV SOLN
INTRAVENOUS | Status: DC
  Administered 2017-09-15: 10:00:00 via INTRAVENOUS

## 2017-09-15 MED ORDER — POTASSIUM CHLORIDE 10 MEQ/50ML IV SOLN
10.0000 meq | INTRAVENOUS | Status: AC
  Administered 2017-09-15 (×4): 10 meq via INTRAVENOUS
  Filled 2017-09-15 (×5): qty 50

## 2017-09-15 NOTE — Assessment & Plan Note (Signed)
Low mag and low phos due to reffeeding syndrome  Plan Replete mag and k

## 2017-09-15 NOTE — Progress Notes (Signed)
[place] Progress Note Patient Name: ARLENA MARSAN DOB: [birth year] MRN: [id number]   Date of Service  09/15/2017  HPI/Events of Note    eICU Interventions  KCl given     Intervention Category Minor Interventions: Electrolytes abnormality - evaluation and management  Beckem Tomberlin S. 09/15/2017, 5:53 AM

## 2017-09-15 NOTE — Progress Notes (Signed)
[place] [facility]   Reason for visit: Follow up on pneumoperitoneum and purulent peritonitis  Interval History: now off of pressor support, WBC stable at 16, from a peak of 50, remains afebrile; Total days of antibiotics 8 Day 7 pip-tazo Day 6 linezolid Day 6 anidulafungin   Physical Exam: Constitutional:  Vitals:   09/15/17 1200 09/15/17 1534  BP: 105/60   Pulse: (!) 105   Resp: 20 19  Temp:  97.7 F (36.5 C)  SpO2: 100%    patient appears in nad, resting comfortably Eyes: anicteric Respiratory: Normal respiratory effort; CTA B, anterior exam Cardiovascular: tachy RR  Review of Systems: Unable to be assessed due to mental status  Lab Results  Component Value Date   WBC 16.6 (H) 09/15/2017   HGB 10.9 (L) 09/15/2017   HCT 32.7 (L) 09/15/2017   MCV 85.8 09/15/2017   PLT 69 (L) 09/15/2017    Lab Results  Component Value Date   CREATININE 1.70 (H) 09/15/2017   BUN 12 09/15/2017   NA 146 (H) 09/15/2017   K 2.9 (L) 09/15/2017   CL 116 (H) 09/15/2017   CO2 20 (L) 09/15/2017    Lab Results  Component Value Date   ALT 14 09/13/2017   AST 19 09/13/2017   ALKPHOS 75 09/13/2017     Microbiology: Recent Results (from the past 240 hour(s))  Culture, blood (Routine X 2) w Reflex to ID Panel     Status: Abnormal   Collection Time: 09/09/17  2:09 PM  Result Value Ref Range Status   Specimen Description BLOOD RIGHT ANTECUBITAL  Final   Special Requests IN PEDIATRIC BOTTLE Blood Culture adequate volume  Final   Culture  Setup Time   Final    BUDDING YEAST SEEN IN PEDIATRIC BOTTLE CRITICAL RESULT CALLED TO, READ BACK BY AND VERIFIED WITH: N GLOGOVAC,PHARMD AT 1652 09/11/17 BY L BENFIELD Performed at [facility], [place] [address] [place], [place] [address]    Culture CANDIDA GLABRATA (A)  Final   Report Status 09/13/2017 FINAL  Final  Blood Culture ID Panel (Reflexed)     Status: Abnormal   Collection Time: 09/09/17  2:09 PM  Result  Value Ref Range Status   Enterococcus species NOT DETECTED NOT DETECTED Final   Listeria monocytogenes NOT DETECTED NOT DETECTED Final   Staphylococcus species NOT DETECTED NOT DETECTED Final   Staphylococcus aureus NOT DETECTED NOT DETECTED Final   Streptococcus species NOT DETECTED NOT DETECTED Final   Streptococcus agalactiae NOT DETECTED NOT DETECTED Final   Streptococcus pneumoniae NOT DETECTED NOT DETECTED Final   Streptococcus pyogenes NOT DETECTED NOT DETECTED Final   Acinetobacter baumannii NOT DETECTED NOT DETECTED Final   Enterobacteriaceae species NOT DETECTED NOT DETECTED Final   Enterobacter cloacae complex NOT DETECTED NOT DETECTED Final   Escherichia coli NOT DETECTED NOT DETECTED Final   Klebsiella oxytoca NOT DETECTED NOT DETECTED Final   Klebsiella pneumoniae NOT DETECTED NOT DETECTED Final   Proteus species NOT DETECTED NOT DETECTED Final   Serratia marcescens NOT DETECTED NOT DETECTED Final   Haemophilus influenzae NOT DETECTED NOT DETECTED Final   Neisseria meningitidis NOT DETECTED NOT DETECTED Final   Pseudomonas aeruginosa NOT DETECTED NOT DETECTED Final   Candida albicans NOT DETECTED NOT DETECTED Final   Candida glabrata DETECTED (A) NOT DETECTED Final    Comment: CRITICAL RESULT CALLED TO, READ BACK BY AND VERIFIED WITH: N GLOGOVAC,PHARMD AT 1652 09/11/17 BY L BENFIELD    Candida  krusei NOT DETECTED NOT DETECTED Final   Candida parapsilosis NOT DETECTED NOT DETECTED Final   Candida tropicalis NOT DETECTED NOT DETECTED Final    Comment: Performed at [facility], [place] [address], [place], [place] [address]  Culture, blood (Routine X 2) w Reflex to ID Panel     Status: Abnormal   Collection Time: 09/09/17  2:34 PM  Result Value Ref Range Status   Specimen Description BLOOD RIGHT HAND  Final   Special Requests IN PEDIATRIC BOTTLE Blood Culture adequate volume  Final   Culture  Setup Time   Final    YEAST IN PEDIATRIC BOTTLE CRITICAL RESULT CALLED TO,  READ BACK BY AND VERIFIED WITH: J. GADHIA,PHARMD 1946 09/11/2017 T. TYSOR    Culture CANDIDA GLABRATA (A)  Final   Report Status 09/14/2017 FINAL  Final  Culture, Urine     Status: None   Collection Time: 09/10/17  1:49 PM  Result Value Ref Range Status   Specimen Description URINE, RANDOM  Final   Special Requests NONE  Final   Culture   Final    NO GROWTH Performed at [facility], [place] [address], [place], [place] [address]    Report Status 09/11/2017 FINAL  Final    Impression/Plan:  1. Fungemia - C glabrata in 2/2.  Blood cultures repeated today  Continue with anidulafungin.  2. Peritonitis - on linezolid and pip/tazo as well as above.  No changes.    3. Acute renal injury - creat with mild elevation compared to yesterday.  Continuing to monitor.

## 2017-09-15 NOTE — Progress Notes (Signed)
5 Days Post-Op   Subjective/Chief Complaint: Pt with no acute issues overnight.  TFs started and pt tolerating well. Pt con't with BMs via flexiseal    Objective: Vital signs in last 24 hours: Temp:  [98.1 F (36.7 C)-99.9 F (37.7 C)] 98.3 F (36.8 C) (10/10 0804) Pulse Rate:  [93-114] 105 (10/10 1200) Resp:  [16-29] 20 (10/10 1200) BP: (88-128)/(44-73) 105/60 (10/10 1200) SpO2:  [75 %-100 %] 100 % (10/10 1200) Weight:  [70.7 kg (155 lb 13.8 oz)] 70.7 kg (155 lb 13.8 oz) (10/10 0413) Last BM Date: 09/14/17  Intake/Output from previous day: 10/09 0701 - 10/10 0700 In: 3810 [I.V.:1800; NG/GT:1110; IV Piggyback:900] Out: 1610 [Urine:6600; Emesis/NG output:50; Drains:95; Stool:700] Intake/Output this shift: Total I/O In: 50 [IV Piggyback:50] Out: -   General appearance: alert and cooperative Cardio: tachy, RR GI: soft, non-tender; bowel sounds normal; no masses,  no organomegaly and incision c/d/i,  JPs SS  Lab Results:   Recent Labs  09/14/17 0347 09/15/17 0400  WBC 15.2* 16.6*  HGB 10.4* 10.9*  HCT 31.4* 32.7*  PLT 83* 69*   BMET  Recent Labs  09/14/17 0347 09/15/17 0400  NA 150* 146*  K 3.2* 2.9*  CL 124* 116*  CO2 17* 20*  GLUCOSE 114* 164*  BUN 11 12  CREATININE 1.59* 1.70*  CALCIUM 7.3* 7.7*   PT/INR No results for input(s): LABPROT, INR in the last 72 hours. ABG  Recent Labs  09/13/17 1128  PHART 7.283*  HCO3 15.9*    Studies/Results: Dg Chest Port 1 View  Result Date: 09/14/2017 CLINICAL DATA:  Respiratory failure. EXAM: PORTABLE CHEST 1 VIEW COMPARISON:  09/11/2017. FINDINGS: Interim extubation. NG tube tip in the upper portion of the stomach. Right PICC line in unchanged position with tip in right atrium. Cardiomegaly. Diffuse bilateral pulmonary infiltrates consistent pulmonary edema noted on today's exam. Bilateral pleural effusions. Findings consistent CHF. No pneumothorax. IMPRESSION: 1. Interim extubation. NG tube tip noted in the  upper portion stomach. Right PICC line tip noted in unchanged position in the right atrium. 2. Cardiomegaly with diffuse pulmonary infiltrates consistent with pulmonary edema noted on today's exam. Bilateral pleural effusions. Electronically Signed   By: Marcello Moores  Register   On: 09/14/2017 06:13   Dg Abd Portable 1v  Result Date: 09/14/2017 CLINICAL DATA:  Ileus EXAM: PORTABLE ABDOMEN - 1 VIEW COMPARISON:  09/09/2017 FINDINGS: NG tube tip projects over the mid stomach. Nonobstructive bowel gas pattern. No free air organomegaly. IMPRESSION: No evidence of bowel obstruction or ileus. Electronically Signed   By: Rolm Baptise M.D.   On: 09/14/2017 09:32    Anti-infectives: Anti-infectives    Start     Dose/Rate Route Frequency Ordered Stop   09/13/17 1200  piperacillin-tazobactam (ZOSYN) IVPB 3.375 g     3.375 g 12.5 mL/hr over 240 Minutes Intravenous Every 8 hours 09/13/17 0732     09/12/17 1500  anidulafungin (ERAXIS) 100 mg in sodium chloride 0.9 % 100 mL IVPB  Status:  Discontinued     100 mg 78 mL/hr over 100 Minutes Intravenous Every 24 hours 09/11/17 1448 09/11/17 1500   09/11/17 1500  anidulafungin (ERAXIS) 200 mg in sodium chloride 0.9 % 200 mL IVPB  Status:  Discontinued     200 mg 78 mL/hr over 200 Minutes Intravenous  Once 09/11/17 1448 09/11/17 1500   09/11/17 1400  anidulafungin (ERAXIS) 100 mg in sodium chloride 0.9 % 100 mL IVPB     100 mg 78 mL/hr over 100 Minutes Intravenous  Every 24 hours 09/10/17 1246     09/11/17 0000  piperacillin-tazobactam (ZOSYN) IVPB 2.25 g  Status:  Discontinued     2.25 g 100 mL/hr over 30 Minutes Intravenous Every 6 hours 09/10/17 1410 09/13/17 0732   09/10/17 1430  linezolid (ZYVOX) IVPB 600 mg     600 mg 300 mL/hr over 60 Minutes Intravenous Every 12 hours 09/10/17 1414     09/10/17 1400  anidulafungin (ERAXIS) 200 mg in sodium chloride 0.9 % 200 mL IVPB     200 mg 78 mL/hr over 200 Minutes Intravenous  Once 09/10/17 1246 09/10/17 1651    09/10/17 1400  linezolid (ZYVOX) IVPB 600 mg  Status:  Discontinued     600 mg 300 mL/hr over 60 Minutes Intravenous Every 12 hours 09/10/17 1344 09/10/17 1414   09/09/17 1400  vancomycin (VANCOCIN) IVPB 1000 mg/200 mL premix  Status:  Discontinued     1,000 mg 200 mL/hr over 60 Minutes Intravenous Every 24 hours 09/09/17 1346 09/10/17 1344   09/09/17 1345  piperacillin-tazobactam (ZOSYN) IVPB 3.375 g  Status:  Discontinued     3.375 g 12.5 mL/hr over 240 Minutes Intravenous Every 8 hours 09/09/17 1344 09/10/17 1410   09/08/17 1000  vancomycin (VANCOCIN) IVPB 1000 mg/200 mL premix     1,000 mg 200 mL/hr over 60 Minutes Intravenous  Once 09/08/17 0946 09/08/17 1100   09/08/17 0950  ceFAZolin (ANCEF) 2-4 GM/100ML-% IVPB    Comments:  Bridget Hartshorn   : cabinet override      09/08/17 0950 09/08/17 1040   09/08/17 0947  vancomycin (VANCOCIN) 1-5 GM/200ML-% IVPB  Status:  Discontinued    Comments:  Bridget Hartshorn   : cabinet override      09/08/17 0947 09/08/17 0956   09/08/17 0941  vancomycin (VANCOCIN) 1-5 GM/200ML-% IVPB    Comments:  [place] Ron   : cabinet override      09/08/17 0941 09/08/17 1000   09/08/17 0810  ceFAZolin (ANCEF) IVPB 2g/100 mL premix     2 g 200 mL/hr over 30 Minutes Intravenous On call to O.R. 09/08/17 0811 09/08/17 1050      Assessment/Plan: s/p Procedure(s): Exploratory laparotomy with takedown of G tube, repair gastrostomy abdominal washout (N/A) Making slow progress.  Updated her sister, Cheri Rous, this afternoon. -Appreciate CCMs and IDs assistance. -Con't dressing changes. -Will ask to mobilize -barrier cream to gluteus   LOS: 6 days    [name]., [facility] 09/15/2017

## 2017-09-15 NOTE — Progress Notes (Signed)
PULMONARY / CRITICAL CARE MEDICINE   Name: Danielle [name] MRN: [id number] DOB: 1946-01-21 PCP Iona Beard, MD LOS 6 as of 09/15/2017     ADMISSION DATE:  09/08/2017 CONSULTATION DATE:  09/09/17  REFERRING MD:  Darrick Meigs   71 yo female former smoker admitted for surgical intervention of hiatal hernia.  She had laparoscopic gastropexy, lysis of adhesions, and G tube placement.  Developed septic shock and VDRF post op from pneumoperitoneum with purulent peritonitis with concern for G tube leak.  PMHx of Crohn's disease, Psoriasis. STUDIES:  CT angio chest 10/4 >> large HH, compressive ATX, small b/l effusions CT abd/pelvis 10/4 >>air/fluid collection mid to lower mediastinum Echo 10/06 >> EF 65 to 70%, grade 1 DD, PAS 34 mmHg  MICROBIOLOGY: MRSA PCR 9/27 >>>  Positive  Blood Cultures x2 10/4 >>> Candida glabrata Urine 10/5 >>> negative  ANTIBIOTICS: Ancef 10/3 (periop) Vancomycin 10/3 >>> Zosyn 10/4 >>> Anidulafungin 10/5 >>>   LINES/TUBES: ETT 10/05 >> 10/06  Rt PICC 10/05 >> Femoral A line 10/05 >> 10/07   SIGNIFICANT EVENTS: 10/03 Admit w/ lap placement of gastrostomy tube & gastropexy with lysis of adhesions 10/04 PCCM consult for hypotension post-op . Blood Cultures x2 10/4 >>> Candida glabrata 10/05 Emergent Ex Lap 10/06 Extubated 10/07 Off pressors. Needed lot of pain meds 10/8 - niece at bedside. Says patient constantly moaning and drowsy but arousable and awaere. No fever. 3rd spacing +. Remains off vent.  10/9./18 - not on vent, not on pressors. Stool noticed via vagina. Hypoactive delirium continues; CXR wet with lot of 3rd spacing. Severe hypoglycemia continues - needing high dose d10 drip, AKI better but Na 150    SUBJECTIVE/OVERNIGHT/INTERVAL HX 09/15/2017: delirium much improved. Respiratory pattern much improved. Profound dyselectrolytemia + but Na beter . - ON TF and tolerating well . Not on TPN.  Not on vent. Not on pressors. Now hyperglycemic on  D10   VITAL SIGNS: BP (!) 89/44   Pulse 100   Temp 98.3 F (36.8 C) (Oral)   Resp 19   Ht 5' 4.5" (1.638 m)   Wt 70.7 kg (155 lb 13.8 oz)   SpO2 (!) 75%   BMI 26.34 kg/m   HEMODYNAMICS: CVP:  [9 mmHg-16 mmHg] 9 mmHg  VENTILATOR SETTINGS:    INTAKE / OUTPUT: I/O last 3 completed shifts: In: 5065 [I.V.:2655; NG/GT:1110; IV Piggyback:1300] Out: 8870 [Urine:7700; Emesis/NG output:350; Drains:120; Stool:700]     EXAM   General Appearance:    Looks very deconditioned but much better looking  Head:    Normocephalic, without obvious abnormality, atraumatic  Eyes:    PERRL - yes, conjunctiva/corneas - clear      Ears:    Normal external ear canals, both ears  Nose:   NG tube - yes  Throat:  ETT TUBE - no , OG tube - no  Neck:   Supple,  No enlargement/tenderness/nodules     Lungs:     Clear to auscultation bilaterally,  Chest wall:    No deformity  Heart:    S1 and S2 normal, no murmur, CVP - no.  Pressors - no  Abdomen:     Soft, no masses, no organomegaly  Genitalia:    Not done  Rectal:   not done  Extremities:   Extremities- edema ++     Skin:   Intact in exposed areas . Sacral area - per wound care 10/9./18 - >  Bilat butocks and inner groin are red and macerated with partial  thickness skin loss to right inner buttock and gluteal fold; affected area is red and moist; 5X6X.1cm; appearance consistent with moisture associated skin damage. This is NOT a pressure injury.     Neurologic:   Sedation - none -> RASS - -1 and better . Moves all 4s - yes. CAM-ICU - much improved . Orientation - partial +      LABS  PULMONARY  Recent Labs Lab 09/10/17 1111 09/10/17 1214 09/10/17 1416 09/10/17 1418 09/11/17 0355 09/13/17 1128  PHART 7.038* 7.212* 7.268*  --  7.289* 7.283*  PCO2ART 35.9 34.2 32.4  --  32.1 34.7  PO2ART 183.0* 184.0* 123*  --  145* 109*  HCO3 9.7* 13.8* 14.3* 16.0* 14.7* 15.9*  TCO2 11* 15*  --   --   --   --   O2SAT 99.0 99.0 97.4 76.0  73.9  97.8 97.5    CBC  Recent Labs Lab 09/13/17 0500 09/14/17 0347 09/15/17 0400  HGB 10.1* 10.4* 10.9*  HCT 29.8* 31.4* 32.7*  WBC 16.0* 15.2* 16.6*  PLT 100* 83* 69*    COAGULATION No results for input(s): INR in the last 168 hours.  CARDIAC    Recent Labs Lab 09/09/17 0529 09/09/17 1800 09/10/17 0003 09/10/17 0337  TROPONINI <0.03 <0.03 <0.03 <0.03   No results for input(s): PROBNP in the last 168 hours.   CHEMISTRY  Recent Labs Lab 09/11/17 0509 09/12/17 0415 09/13/17 0500 09/14/17 0347 09/15/17 0400  NA 140 147* 149* 150* 146*  K 3.3* 3.7 3.3* 3.2* 2.9*  CL 114* 121* 121* 124* 116*  CO2 17* 18* 19* 17* 20*  GLUCOSE 171* 106* 104* 114* 164*  BUN 30* 23* 14 11 12   CREATININE 2.46* 2.13* 1.76* 1.59* 1.70*  CALCIUM 6.0* 7.3* 7.5* 7.3* 7.7*  MG 1.0* 2.0 1.8 1.8 1.5*  PHOS 4.1 3.8 4.0 3.5 2.8   Estimated Creatinine Clearance: 30.5 mL/min (A) (by C-G formula based on SCr of 1.7 mg/dL (H)).   LIVER  Recent Labs Lab 09/10/17 0851 09/11/17 0509 09/13/17 0500  AST 48* 45* 19  ALT 21 29 14   ALKPHOS 91 93 75  BILITOT 0.6 0.7 1.2  PROT 5.9* 4.4* 5.7*  ALBUMIN 2.4* 1.8* 3.2*     INFECTIOUS  Recent Labs Lab 09/09/17 1409 09/09/17 1800 09/10/17 0420 09/11/17 0509  LATICACIDVEN 4.3* 1.8  --   --   PROCALCITON  --  25.35 57.49 52.02     ENDOCRINE CBG (last 3)   Recent Labs  09/14/17 2320 09/15/17 0304 09/15/17 0754  GLUCAP 101* 80 138*         IMAGING x48h  - image(s) personally visualized  -   highlighted in bold Dg Chest Port 1 View  Result Date: 09/14/2017 CLINICAL DATA:  Respiratory failure. EXAM: PORTABLE CHEST 1 VIEW COMPARISON:  09/11/2017. FINDINGS: Interim extubation. NG tube tip in the upper portion of the stomach. Right PICC line in unchanged position with tip in right atrium. Cardiomegaly. Diffuse bilateral pulmonary infiltrates consistent pulmonary edema noted on today's exam. Bilateral pleural effusions. Findings  consistent CHF. No pneumothorax. IMPRESSION: 1. Interim extubation. NG tube tip noted in the upper portion stomach. Right PICC line tip noted in unchanged position in the right atrium. 2. Cardiomegaly with diffuse pulmonary infiltrates consistent with pulmonary edema noted on today's exam. Bilateral pleural effusions. Electronically Signed   By: Marcello Moores  Register   On: 09/14/2017 06:13   Dg Abd Portable 1v  Result Date: 09/14/2017 CLINICAL DATA:  Ileus EXAM: PORTABLE ABDOMEN -  1 VIEW COMPARISON:  09/09/2017 FINDINGS: NG tube tip projects over the mid stomach. Nonobstructive bowel gas pattern. No free air organomegaly. IMPRESSION: No evidence of bowel obstruction or ileus. Electronically Signed   By: Rolm Baptise M.D.   On: 09/14/2017 09:32       ASSESSMENT and PLAN  Respiratory failure, post-operative ([place]) reamins extubated  post extubation but delirium, sepsis and volume ooverload and AKI places her at risk fo reintubation but seems issue are slowly improving/stabliizing on 09/15/2017 and risk for reitnubation next 48h has diminished   PLAN Monitor closely O2 for pulse ox > 88% Poor bipap candidate Lasix for pulm edema  Encephalopathy acute Mild hypoactive delirium + but much better 09/15/2017   Pl,an Supportive care  Anasarca Severe 3d spacing with hypernatremia   Plan chagne d10 at 84 to d5 half at kvo; reduce fluid load continjue lasix   Acute renal failure superimposed on stage 3 chronic kidney disease (HCC) Slowly better but hypernatremic and even Na better with free water  Plan Change d10 to d5 half normal saline and reduce rate to [facility] Continue lasix  Hypomagnesemia correct  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 Current LOS is LOS 6 days  CODE STATUS    Code Status Orders        Start     Ordered   09/08/17 1538  Full code  Continuous     09/08/17 1537    Code Status History    Date Active Date Inactive Code Status Order ID Comments User Context   07/27/2017 10:20 PM 07/29/2017  7:22 PM Full Code [id number]  Elmarie Shiley, MD Inpatient   06/08/2017  7:54 PM 06/17/2017  5:44 PM Full Code [id number]  Norval Morton, MD ED   05/07/2017 12:32 AM 05/14/2017  8:29 PM Full Code [id number]  Ivor Costa, MD ED   04/21/2017 12:44 PM 04/29/2017  8:32 PM Full Code [id number]  Jill Alexanders, PA-C Inpatient   12/05/2015  3:12 PM 12/09/2015  6:13 PM Full Code [id number]  Robbie Lis, MD ED   01/26/2015  6:03 PM 02/03/2015  3:19 PM Full Code [id number]  Verlee Monte, MD ED   11/18/2014  8:42 PM 11/22/2014  6:28 PM Full Code [id number]  Debbe Odea, MD ED   07/09/2014  2:22 PM 07/10/2014  3:10 PM Full Code [id number]  Martinique, Peter M, MD Inpatient   07/08/2014  7:14 PM 07/09/2014  2:22 PM Full Code [id number]  Jacolyn Reedy, MD Inpatient   03/20/2014  4:38 PM 03/22/2014  3:21 PM Full Code [id number]  Geradine Girt, DO Inpatient   08/22/2013  1:53 PM 08/23/2013  9:13 PM Full Code [id number]  Janece Canterbury, MD Inpatient   07/12/2013  8:23 PM 07/16/2013  8:55 PM Full Code [id number]  Delfina Redwood, MD Inpatient   04/29/2013  5:22 AM 04/30/2013  4:26 PM Full Code [id number]  Rise Patience, MD Inpatient   02/24/2013  6:52 PM 02/27/2013  3:18 PM Full Code [id number]  Jonetta Osgood, MD Inpatient   12/15/2011 11:12 PM 12/18/2011 12:14 PM Full Code [id number]  Caryn Bee, RN Inpatient   12/10/2011  8:57 PM 12/14/2011  7:56 PM Full Code [id number]  Derinda Sis, RN ED   11/06/2011  9:30 PM 11/13/2011  5:09 PM Full Code [id number]  Pecola Leisure, RN Inpatient        DISPO Move to sdu. CCM  Has signed  out medical consultation to TRiad Dr Florene Glen starting 7am 09/16/17       Dr. Brand Males, M.D., [facility].C.P Pulmonary and Critical Care Medicine Staff Physician [facility] Pager: [phone], If no answer or between  15:00h - 7:00h: call [phone]  09/15/2017 9:16 AM

## 2017-09-16 ENCOUNTER — Inpatient Hospital Stay (HOSPITAL_COMMUNITY): Payer: Medicare HMO

## 2017-09-16 DIAGNOSIS — N171 Acute kidney failure with acute cortical necrosis: Secondary | ICD-10-CM

## 2017-09-16 DIAGNOSIS — D72829 Elevated white blood cell count, unspecified: Secondary | ICD-10-CM

## 2017-09-16 LAB — GLUCOSE, CAPILLARY
GLUCOSE-CAPILLARY: 122 mg/dL — AB (ref 65–99)
GLUCOSE-CAPILLARY: 126 mg/dL — AB (ref 65–99)
GLUCOSE-CAPILLARY: 65 mg/dL (ref 65–99)
Glucose-Capillary: 101 mg/dL — ABNORMAL HIGH (ref 65–99)
Glucose-Capillary: 103 mg/dL — ABNORMAL HIGH (ref 65–99)
Glucose-Capillary: 69 mg/dL (ref 65–99)

## 2017-09-16 LAB — CBC WITH DIFFERENTIAL/PLATELET
BASOS ABS: 0 10*3/uL (ref 0.0–0.1)
BASOS PCT: 0 %
EOS ABS: 0.3 10*3/uL (ref 0.0–0.7)
Eosinophils Relative: 1 %
HCT: 31.2 % — ABNORMAL LOW (ref 36.0–46.0)
HEMOGLOBIN: 10.5 g/dL — AB (ref 12.0–15.0)
Lymphocytes Relative: 23 %
Lymphs Abs: 4.9 10*3/uL — ABNORMAL HIGH (ref 0.7–4.0)
MCH: 28.9 pg (ref 26.0–34.0)
MCHC: 33.7 g/dL (ref 30.0–36.0)
MCV: 86 fL (ref 78.0–100.0)
Monocytes Absolute: 1.5 10*3/uL — ABNORMAL HIGH (ref 0.1–1.0)
Monocytes Relative: 7 %
NEUTROS PCT: 68 %
Neutro Abs: 14.1 10*3/uL — ABNORMAL HIGH (ref 1.7–7.7)
Platelets: 51 10*3/uL — ABNORMAL LOW (ref 150–400)
RBC: 3.63 MIL/uL — AB (ref 3.87–5.11)
RDW: 16.4 % — ABNORMAL HIGH (ref 11.5–15.5)
WBC: 20.8 10*3/uL — AB (ref 4.0–10.5)

## 2017-09-16 LAB — BASIC METABOLIC PANEL
Anion gap: 13 (ref 5–15)
BUN: 18 mg/dL (ref 6–20)
CHLORIDE: 108 mmol/L (ref 101–111)
CO2: 24 mmol/L (ref 22–32)
CREATININE: 1.78 mg/dL — AB (ref 0.44–1.00)
Calcium: 7.5 mg/dL — ABNORMAL LOW (ref 8.9–10.3)
GFR calc non Af Amer: 28 mL/min — ABNORMAL LOW (ref >60)
GFR, EST AFRICAN AMERICAN: 32 mL/min — AB (ref >60)
Glucose, Bld: 123 mg/dL — ABNORMAL HIGH (ref 65–99)
POTASSIUM: 2.9 mmol/L — AB (ref 3.5–5.1)
SODIUM: 145 mmol/L (ref 135–145)

## 2017-09-16 LAB — C DIFFICILE QUICK SCREEN W PCR REFLEX
C Diff antigen: NEGATIVE
C Diff interpretation: NOT DETECTED
C Diff toxin: NEGATIVE

## 2017-09-16 LAB — MAGNESIUM: MAGNESIUM: 1.8 mg/dL (ref 1.7–2.4)

## 2017-09-16 LAB — PHOSPHORUS: PHOSPHORUS: 3.3 mg/dL (ref 2.5–4.6)

## 2017-09-16 MED ORDER — POTASSIUM CHLORIDE 20 MEQ/15ML (10%) PO SOLN
40.0000 meq | Freq: Two times a day (BID) | ORAL | Status: DC
  Administered 2017-09-16 – 2017-09-17 (×4): 40 meq via ORAL
  Filled 2017-09-16 (×4): qty 30

## 2017-09-16 MED ORDER — VITAL 1.5 CAL PO LIQD
1000.0000 mL | ORAL | Status: DC
  Administered 2017-09-16: 1000 mL
  Filled 2017-09-16 (×10): qty 1000

## 2017-09-16 MED ORDER — IOPAMIDOL (ISOVUE-300) INJECTION 61%
100.0000 mL | Freq: Once | INTRAVENOUS | Status: AC | PRN
  Administered 2017-09-16: 80 mL via INTRAVENOUS

## 2017-09-16 MED ORDER — OXYCODONE-ACETAMINOPHEN 5-325 MG PO TABS
1.0000 | ORAL_TABLET | ORAL | Status: DC | PRN
  Administered 2017-09-16 – 2017-09-22 (×3): 1 via ORAL
  Filled 2017-09-16 (×4): qty 1

## 2017-09-16 MED ORDER — IOPAMIDOL (ISOVUE-300) INJECTION 61%
INTRAVENOUS | Status: AC
  Filled 2017-09-16: qty 100

## 2017-09-16 MED ORDER — POTASSIUM CHLORIDE 10 MEQ/50ML IV SOLN
10.0000 meq | INTRAVENOUS | Status: AC
  Administered 2017-09-16 (×4): 10 meq via INTRAVENOUS
  Filled 2017-09-16 (×4): qty 50

## 2017-09-16 NOTE — Evaluation (Signed)
Physical Therapy Evaluation Patient Details Name: Danielle [name] MRN: [id number] DOB: 01-31-1946 Today's Date: 09/16/2017   History of Present Illness  71 yo female s/p Gtube placement, gastropexy, hernia repair 09/08/17; emergent exp lap, hepatorrhapy, mediastinal drains, gastrotomy 10/5; peritonitis, septic shock, VDRF-extubated 10/6, hypotension, hypokalemia. Hx of hiatal hernia, Crohn's disease.     Clinical Impression  On eval, pt required Min assist +2 for mobility. She was able to perform a stand pivot from bed to recliner using a RW. She participated well.  Pt c/o back and abd pain with activity. She remained on [place] O2 during session. No family present during session. Will follow and progress activity as tolerated. At this time, recommendation is for ST rehab at [facility].     Follow Up Recommendations SNF;Supervision/Assistance - 24 hour (depending on progress)    Equipment Recommendations   (continuing to assess-may need Rw if pt doesn;t already have one)    Recommendations for Other Services       Precautions / Restrictions Precautions Precautions: Fall Precaution Comments: R and L abdominal jp drains, flexiseal, G tube Restrictions Weight Bearing Restrictions: No      Mobility  Bed Mobility Overal bed mobility: Needs Assistance Bed Mobility: Supine to Sit     Supine to sit: Min assist;HOB elevated;+2 for safety/equipment;+2 for physical assistance     General bed mobility comments: Assist for safey, lines.   Transfers Overall transfer level: Needs assistance Equipment used: Rolling walker (2 wheeled) Transfers: Sit to/from [facility] Sit to Stand: Min assist;+2 physical assistance;+2 safety/equipment Stand pivot transfers: Min assist;+2 physical assistance;+2 safety/equipment       General transfer comment: Assist to rise, stabilize, control descent. Stand pivot, bed to recliner, with RW. Remained on [place] O2.  Ambulation/Gait              General Gait Details: NT on today  Stairs            Wheelchair Mobility    Modified Rankin (Stroke Patients Only)       Balance Overall balance assessment: Needs assistance         Standing balance support: Bilateral upper extremity supported Standing balance-Leahy Scale: Poor                               Pertinent Vitals/Pain Pain Assessment: 0-10 Pain Score: 7  Pain Location: back, abdomen Pain Descriptors / Indicators: Aching;Sore Pain Intervention(s): Premedicated before session;Repositioned;Limited activity within patient's tolerance    Home Living Family/patient expects to be discharged to:: Private residence Living Arrangements: Other relatives (sister) Available Help at Discharge: Family;Available PRN/intermittently Type of Home: House Home Access: Stairs to enter   [employer] of Steps: 1 Home Layout: Multi-level Home Equipment: Shower seat;Bedside commode;Cane - single point      Prior Function Level of Independence: Independent with assistive device(s)         Comments: Pt used SPC when ambulating in community     Hand Dominance   Dominant Hand: Right    Extremity/Trunk Assessment   Upper Extremity Assessment Upper Extremity Assessment: Generalized weakness (pt had trouble holding cup, using spoon during PT eval)    Lower Extremity Assessment Lower Extremity Assessment: Generalized weakness    Cervical / Trunk Assessment Cervical / Trunk Assessment: Kyphotic  Communication   Communication: No difficulties  Cognition Arousal/Alertness: Awake/alert Behavior During Therapy: WFL for tasks assessed/performed Overall Cognitive Status: Within Functional Limits for tasks assessed  General Comments      Exercises General Exercises - Lower Extremity Long Arc Quad: AROM;Both;10 reps;Seated   Assessment/Plan    PT Assessment Patient needs continued PT  services  PT Problem List Decreased strength;Decreased mobility;Decreased activity tolerance;Decreased balance;Decreased knowledge of use of DME;Pain;Decreased coordination;Decreased safety awareness;Decreased skin integrity       PT Treatment Interventions DME instruction;Gait training;Therapeutic activities;Therapeutic exercise;Patient/family education;Balance training;Functional mobility training    PT Goals (Current goals can be found in the Care Plan section)  Acute Rehab PT Goals Patient Stated Goal: to get up. less pain. PT Goal Formulation: With patient Time For Goal Achievement: 09/30/17 Potential to Achieve Goals: Good    Frequency Min 3X/week   Barriers to discharge        Co-evaluation               AM-PAC PT "6 Clicks" Daily Activity  Outcome Measure Difficulty turning over in bed (including adjusting bedclothes, sheets and blankets)?: Unable Difficulty moving from lying on back to sitting on the side of the bed? : Unable Difficulty sitting down on and standing up from a chair with arms (e.g., wheelchair, bedside commode, etc,.)?: Unable Help needed moving to and from a bed to chair (including a wheelchair)?: A Little Help needed walking in hospital room?: A Little Help needed climbing 3-5 steps with a railing? : A Lot 6 Click Score: 11    End of Session Equipment Utilized During Treatment: Oxygen Activity Tolerance: Patient tolerated treatment well Patient left: in chair;with call bell/phone within reach   PT Visit Diagnosis: Muscle weakness (generalized) (M62.81);Difficulty in walking, not elsewhere classified (R26.2)    Time: 6712-4580 PT Time Calculation (min) (ACUTE ONLY): 29 min   Charges:   PT Evaluation $PT Eval Low Complexity: 1 Low PT Treatments $Therapeutic Activity: 8-22 mins   PT G Codes:          Weston Anna, MPT Pager: [phone] '

## 2017-09-16 NOTE — Progress Notes (Signed)
[facility] for Infectious Disease   Reason for visit: Follow up on pneumoperitoneum and purulent peritonitis  Interval History: remains off of pressor support, WBC up again to 20, remains afebrile; platelets continue to trend down Total days of antibiotics 9 Day 8 pip-tazo Day 7 linezolid Day 7 anidulafungin   Physical Exam: Constitutional:  Vitals:   09/16/17 1153 09/16/17 1308  BP:  96/61  Pulse:    Resp:  15  Temp: 98.4 F (36.9 C)   SpO2:     patient appears in nad, resting comfortably Eyes: anicteric Respiratory: Normal respiratory effort; CTA B, anterior exam Cardiovascular: tachy RR  Review of Systems: Unable to be assessed due to mental status  Lab Results  Component Value Date   WBC 20.8 (H) 09/16/2017   HGB 10.5 (L) 09/16/2017   HCT 31.2 (L) 09/16/2017   MCV 86.0 09/16/2017   PLT 51 (L) 09/16/2017    Lab Results  Component Value Date   CREATININE 1.78 (H) 09/16/2017   BUN 18 09/16/2017   NA 145 09/16/2017   K 2.9 (L) 09/16/2017   CL 108 09/16/2017   CO2 24 09/16/2017    Lab Results  Component Value Date   ALT 14 09/13/2017   AST 19 09/13/2017   ALKPHOS 75 09/13/2017     Microbiology: Recent Results (from the past 240 hour(s))  Culture, blood (Routine X 2) w Reflex to ID Panel     Status: Abnormal   Collection Time: 09/09/17  2:09 PM  Result Value Ref Range Status   Specimen Description BLOOD RIGHT ANTECUBITAL  Final   Special Requests IN PEDIATRIC BOTTLE Blood Culture adequate volume  Final   Culture  Setup Time   Final    BUDDING YEAST SEEN IN PEDIATRIC BOTTLE CRITICAL RESULT CALLED TO, READ BACK BY AND VERIFIED WITH: N GLOGOVAC,PHARMD AT 1652 09/11/17 BY L BENFIELD Performed at [facility], [place] [address] [place], [place] [address]    Culture CANDIDA GLABRATA (A)  Final   Report Status 09/13/2017 FINAL  Final  Blood Culture ID Panel (Reflexed)     Status: Abnormal   Collection Time: 09/09/17  2:09 PM  Result Value Ref  Range Status   Enterococcus species NOT DETECTED NOT DETECTED Final   Listeria monocytogenes NOT DETECTED NOT DETECTED Final   Staphylococcus species NOT DETECTED NOT DETECTED Final   Staphylococcus aureus NOT DETECTED NOT DETECTED Final   Streptococcus species NOT DETECTED NOT DETECTED Final   Streptococcus agalactiae NOT DETECTED NOT DETECTED Final   Streptococcus pneumoniae NOT DETECTED NOT DETECTED Final   Streptococcus pyogenes NOT DETECTED NOT DETECTED Final   Acinetobacter baumannii NOT DETECTED NOT DETECTED Final   Enterobacteriaceae species NOT DETECTED NOT DETECTED Final   Enterobacter cloacae complex NOT DETECTED NOT DETECTED Final   Escherichia coli NOT DETECTED NOT DETECTED Final   Klebsiella oxytoca NOT DETECTED NOT DETECTED Final   Klebsiella pneumoniae NOT DETECTED NOT DETECTED Final   Proteus species NOT DETECTED NOT DETECTED Final   Serratia marcescens NOT DETECTED NOT DETECTED Final   Haemophilus influenzae NOT DETECTED NOT DETECTED Final   Neisseria meningitidis NOT DETECTED NOT DETECTED Final   Pseudomonas aeruginosa NOT DETECTED NOT DETECTED Final   Candida albicans NOT DETECTED NOT DETECTED Final   Candida glabrata DETECTED (A) NOT DETECTED Final    Comment: CRITICAL RESULT CALLED TO, READ BACK BY AND VERIFIED WITH: N GLOGOVAC,PHARMD AT 1652 09/11/17 BY L BENFIELD    Candida krusei NOT DETECTED  NOT DETECTED Final   Candida parapsilosis NOT DETECTED NOT DETECTED Final   Candida tropicalis NOT DETECTED NOT DETECTED Final    Comment: Performed at [facility], [place] [address] [place], [place] [address]  Culture, blood (Routine X 2) w Reflex to ID Panel     Status: Abnormal   Collection Time: 09/09/17  2:34 PM  Result Value Ref Range Status   Specimen Description BLOOD RIGHT HAND  Final   Special Requests IN PEDIATRIC BOTTLE Blood Culture adequate volume  Final   Culture  Setup Time   Final    YEAST IN PEDIATRIC BOTTLE CRITICAL RESULT CALLED TO, READ BACK  BY AND VERIFIED WITH: J. GADHIA,PHARMD 1947 09/11/2017 T. TYSOR    Culture CANDIDA GLABRATA (A)  Final   Report Status 09/14/2017 FINAL  Final  Culture, Urine     Status: None   Collection Time: 09/10/17  1:49 PM  Result Value Ref Range Status   Specimen Description URINE, RANDOM  Final   Special Requests NONE  Final   Culture   Final    NO GROWTH Performed at [facility], [place] [address] [place], [place] [address]    Report Status 09/11/2017 FINAL  Final    Impression/Plan:  1. Fungemia - C glabrata in 2/2.  Blood cultures repeated and ngtd.  Continue with anidulafungin.  2. Peritonitis - on linezolid and pip/tazo as well as above.  No changes.  With decreasing platelets, I will stop linezolid.   3. Leukocytosis - increased again and CT ordered by surgery.

## 2017-09-16 NOTE — Progress Notes (Signed)
6 Days Post-Op   Subjective/Chief Complaint: Pt with no acute changes overnight. Increase in WBC this AM [place]   Objective: Vital signs in last 24 hours: Temp:  [97.7 F (36.5 C)-99.9 F (37.7 C)] 98.4 F (36.9 C) (10/11 1153) Pulse Rate:  [103-118] 104 (10/11 1100) Resp:  [14-26] 15 (10/11 1308) BP: (89-129)/(41-84) 96/61 (10/11 1308) SpO2:  [85 %-100 %] 100 % (10/11 1100) Last BM Date: 09/16/17  Intake/Output from previous day: 10/10 0701 - 10/11 0700 In: 2819.7 [I.V.:199.7; NG/GT:1440; IV Piggyback:1180] Out: 6290 [Urine:5975; Drains:15; Stool:300] Intake/Output this shift: Total I/O In: -  Out: 1175 [Urine:1175]  General appearance: alert and cooperative Cardio: tachy, RR GI: soft, non-tender; bowel sounds normal; no masses,  no organomegaly and incision c/d/i, JP SS  Lab Results:   Recent Labs  09/15/17 0400 09/16/17 0545  WBC 16.6* 20.8*  HGB 10.9* 10.5*  HCT 32.7* 31.2*  PLT 69* 51*   BMET  Recent Labs  09/15/17 0400 09/16/17 0545  NA 146* 145  K 2.9* 2.9*  CL 116* 108  CO2 20* 24  GLUCOSE 164* 123*  BUN 12 18  CREATININE 1.70* 1.78*  CALCIUM 7.7* 7.5*   PT/INR No results for input(s): LABPROT, INR in the last 72 hours. ABG No results for input(s): PHART, HCO3 in the last 72 hours.  Invalid input(s): PCO2, PO2  Studies/Results: No results found.  Anti-infectives: Anti-infectives    Start     Dose/Rate Route Frequency Ordered Stop   09/13/17 1200  piperacillin-tazobactam (ZOSYN) IVPB 3.375 g     3.375 g 12.5 mL/hr over 240 Minutes Intravenous Every 8 hours 09/13/17 0732     09/12/17 1500  anidulafungin (ERAXIS) 100 mg in sodium chloride 0.9 % 100 mL IVPB  Status:  Discontinued     100 mg 78 mL/hr over 100 Minutes Intravenous Every 24 hours 09/11/17 1448 09/11/17 1500   09/11/17 1500  anidulafungin (ERAXIS) 200 mg in sodium chloride 0.9 % 200 mL IVPB  Status:  Discontinued     200 mg 78 mL/hr over 200 Minutes Intravenous  Once  09/11/17 1448 09/11/17 1500   09/11/17 1400  anidulafungin (ERAXIS) 100 mg in sodium chloride 0.9 % 100 mL IVPB     100 mg 78 mL/hr over 100 Minutes Intravenous Every 24 hours 09/10/17 1246     09/11/17 0000  piperacillin-tazobactam (ZOSYN) IVPB 2.25 g  Status:  Discontinued     2.25 g 100 mL/hr over 30 Minutes Intravenous Every 6 hours 09/10/17 1410 09/13/17 0732   09/10/17 1430  linezolid (ZYVOX) IVPB 600 mg     600 mg 300 mL/hr over 60 Minutes Intravenous Every 12 hours 09/10/17 1414     09/10/17 1400  anidulafungin (ERAXIS) 200 mg in sodium chloride 0.9 % 200 mL IVPB     200 mg 78 mL/hr over 200 Minutes Intravenous  Once 09/10/17 1246 09/10/17 1651   09/10/17 1400  linezolid (ZYVOX) IVPB 600 mg  Status:  Discontinued     600 mg 300 mL/hr over 60 Minutes Intravenous Every 12 hours 09/10/17 1344 09/10/17 1414   09/09/17 1400  vancomycin (VANCOCIN) IVPB 1000 mg/200 mL premix  Status:  Discontinued     1,000 mg 200 mL/hr over 60 Minutes Intravenous Every 24 hours 09/09/17 1346 09/10/17 1344   09/09/17 1345  piperacillin-tazobactam (ZOSYN) IVPB 3.375 g  Status:  Discontinued     3.375 g 12.5 mL/hr over 240 Minutes Intravenous Every 8 hours 09/09/17 1344 09/10/17 1410   09/08/17  1000  vancomycin (VANCOCIN) IVPB 1000 mg/200 mL premix     1,000 mg 200 mL/hr over 60 Minutes Intravenous  Once 09/08/17 0946 09/08/17 1100   09/08/17 0950  ceFAZolin (ANCEF) 2-4 GM/100ML-% IVPB    Comments:  Danielle [name]   : cabinet override      09/08/17 0950 09/08/17 1040   09/08/17 0947  vancomycin (VANCOCIN) 1-5 GM/200ML-% IVPB  Status:  Discontinued    Comments:  Danielle [name]   : cabinet override      09/08/17 0947 09/08/17 0956   09/08/17 0941  vancomycin (VANCOCIN) 1-5 GM/200ML-% IVPB    Comments:  Danielle [name]   : cabinet override      09/08/17 0941 09/08/17 1000   09/08/17 0810  ceFAZolin (ANCEF) IVPB 2g/100 mL premix     2 g 200 mL/hr over 30 Minutes Intravenous On call to O.R. 09/08/17 0811  09/08/17 1050      Assessment/Plan: s/p Procedure(s): Exploratory laparotomy with takedown of G tube, repair gastrostomy abdominal washout (N/A) With Increase in WBC will obtain CT A/P to eval for IAA.  Will also send Cdiff. She had some ?emesis, TFs on hold for now, will recheck residuals in 4 hrs and if <150cc OK to restart TFs at previous rate. Will start PO pain Rx. Can trial PO Fulls.   LOS: 7 days    Danielle [name]., [facility] 09/16/2017

## 2017-09-16 NOTE — Progress Notes (Signed)
Pt coughing/gagging up thick mucous that appears the same color as the infusing tube feed. When asked pt reports not feeling nauseas, and is unsure if she is coughing or throwing up. MD made aware, orders given to hold tube feed and start NG tube to LIWS. Orders carried out. Will continue to monitor.

## 2017-09-16 NOTE — Progress Notes (Signed)
Pharmacy Antibiotic Note  Danielle [name] is a 71 y.o. female admitted on 09/08/2017 with paraesophageal hiatal hernia s/p laparoscopic G-tube placement and gastropexy, LOA. Post-op ex lap reveals frank pus in abdomen. Patient's currently on broad abx for peritonitis and fungemia.  Today, 09/16/2017: - Day #8 zosyn, vanco/linezolid,  Day #7 anidulafungin - afeb, WBC trending back up - scr trending trending back up (crcl~29), on IV lasix 40mg  q12h - plts down 51 - LFTs wnl - Repeat BCx ordered 10/10  Plan: - Continue zosyn to 3.375 gm IV q8h (infuse over 4 hrs) - continue linezolid 600 mg IV q12h per MD. Monitor plts closely. - continue Eraxis 100 mg IV q24h per MD - monitor renal function closely __________________________  Height: 5' 4.5" (163.8 cm) Weight: 155 lb 13.8 oz (70.7 kg) IBW/kg (Calculated) : 55.85  Temp (24hrs), Avg:99.1 F (37.3 C), Min:97.7 F (36.5 C), Max:99.9 F (37.7 C)   Recent Labs Lab 09/09/17 1409 09/09/17 1800  09/12/17 0415 09/12/17 2155 09/13/17 0500 09/14/17 0347 09/15/17 0400 09/16/17 0545  WBC  --   --   < > 19.0* 17.7* 16.0* 15.2* 16.6* 20.8*  CREATININE  --   --   < > 2.13*  --  1.76* 1.59* 1.70* 1.78*  LATICACIDVEN 4.3* 1.8  --   --   --   --   --   --   --   < > = values in this interval not displayed.  Estimated Creatinine Clearance: 29.1 mL/min (A) (by C-G formula based on SCr of 1.78 mg/dL (H)).    Allergies  Allergen Reactions  . Zithromax [Azithromycin] Swelling and Other (See Comments)    Reaction:  Mouth swelling and blisters   . Aspirin Other (See Comments)    Reaction:  Stomach cramping   . Ibuprofen Other (See Comments)    Pt states that she prefers not to take this medication.   . Iron Nausea And Vomiting    FERROUS SULFATE  . Sulfa Antibiotics Nausea And Vomiting    Antimicrobials this admission: 10/3 Cefazolin x1 dose preop 10/3 Vanc x1 preop, then daily starting 10/4 >> 10/5 10/4 Zosyn >>  10/5 Zyvox >>  10/5  Eraxis >>  Dose adjustments this admission: --   Microbiology results: 10/4 BCx: 2/2 bottles w/ C glabrata 10/5 MRSA PCR: pos 10/5 UCx: NGF No specimens taken from OR 10/10 BCx  Thank you for allowing pharmacy to be a part of this patient's care.  Doreene Eland, PharmD, BCPS.   Pager: [phone] 09/16/2017 9:20 AM

## 2017-09-16 NOTE — Progress Notes (Signed)
PROGRESS NOTE    Danielle [name]  YME:[id number] DOB: [birth year] DOA: 09/08/2017 PCP: Iona Beard, MD    Brief Narrative:  [phone] who presented for surgical correction of hiatal hernia. Pt developed septic shock post-operatively with purulent peritonitis. Initially followed by CCM. Hospitalist now following for medical management  Assessment & Plan:   Principal Problem:   Respiratory failure, post-operative ([place]) Active Problems:   Hypomagnesemia   Acute renal failure superimposed on stage 3 chronic kidney disease ([place])   Hiatal hernia   Immunosuppressed status ([place])   Rectovaginal fistula   Gastrostomy tube in place [facility])   History of repair of hiatal hernia   Peritonitis (HCC)   Fungemia   Encephalopathy acute   Anasarca   Electrolyte imbalance   1. Acute hypoxic resp failure, post-operatively 1. Followed by CCM 2. Currently on minimal O2 support 3. Cont to monitor 2. Acute metabolic encephalopathy 1. Appears much improved 2. Pt alert and oriented 3. Anasarca 1. Suspect secondary to recent voluem resuscitation 2. Continuing lasix 3. Over 6L urine output overnight 4. Repeat bmet in AM 4. Acute on CKD3 1. Tolerating lasix per above 2. Follow bmet in AM 5. Hypomagnesemia 1. Will correct 2. Repeat level in AM 6. Hypokalemia 1. Will replace 2. Replace level in AM 3. Likely secondary to diuresis 7. Peritonitis 1. Cont per CCM 2. On linezolid with zosyn. Linezolid stopped secondary to decreasing platelets 3. ID following 8. Fungemia 1. Per ID, continue anidulafungin as tolerated  DVT prophylaxis: SCD's Code Status: Full Family Communication: Pt in room, family not in room Disposition Plan: Uncertain at this time  Consultants:     Procedures:     Antimicrobials: Anti-infectives    Start     Dose/Rate Route Frequency Ordered Stop   09/13/17 1200  piperacillin-tazobactam (ZOSYN) IVPB 3.375 g     3.375 g 12.5 mL/hr over 240 Minutes Intravenous Every  8 hours 09/13/17 0732     09/12/17 1500  anidulafungin (ERAXIS) 100 mg in sodium chloride 0.9 % 100 mL IVPB  Status:  Discontinued     100 mg 78 mL/hr over 100 Minutes Intravenous Every 24 hours 09/11/17 1448 09/11/17 1500   09/11/17 1500  anidulafungin (ERAXIS) 200 mg in sodium chloride 0.9 % 200 mL IVPB  Status:  Discontinued     200 mg 78 mL/hr over 200 Minutes Intravenous  Once 09/11/17 1448 09/11/17 1500   09/11/17 1400  anidulafungin (ERAXIS) 100 mg in sodium chloride 0.9 % 100 mL IVPB     100 mg 78 mL/hr over 100 Minutes Intravenous Every 24 hours 09/10/17 1246     09/11/17 0000  piperacillin-tazobactam (ZOSYN) IVPB 2.25 g  Status:  Discontinued     2.25 g 100 mL/hr over 30 Minutes Intravenous Every 6 hours 09/10/17 1410 09/13/17 0732   09/10/17 1430  linezolid (ZYVOX) IVPB 600 mg  Status:  Discontinued     600 mg 300 mL/hr over 60 Minutes Intravenous Every 12 hours 09/10/17 1414 09/16/17 1356   09/10/17 1400  anidulafungin (ERAXIS) 200 mg in sodium chloride 0.9 % 200 mL IVPB     200 mg 78 mL/hr over 200 Minutes Intravenous  Once 09/10/17 1246 09/10/17 1651   09/10/17 1400  linezolid (ZYVOX) IVPB 600 mg  Status:  Discontinued     600 mg 300 mL/hr over 60 Minutes Intravenous Every 12 hours 09/10/17 1344 09/10/17 1414   09/09/17 1400  vancomycin (VANCOCIN) IVPB 1000 mg/200 mL premix  Status:  Discontinued  1,000 mg 200 mL/hr over 60 Minutes Intravenous Every 24 hours 09/09/17 1346 09/10/17 1344   09/09/17 1345  piperacillin-tazobactam (ZOSYN) IVPB 3.375 g  Status:  Discontinued     3.375 g 12.5 mL/hr over 240 Minutes Intravenous Every 8 hours 09/09/17 1344 09/10/17 1410   09/08/17 1000  vancomycin (VANCOCIN) IVPB 1000 mg/200 mL premix     1,000 mg 200 mL/hr over 60 Minutes Intravenous  Once 09/08/17 0946 09/08/17 1100   09/08/17 0950  ceFAZolin (ANCEF) 2-4 GM/100ML-% IVPB    Comments:  Bridget Hartshorn   : cabinet override      09/08/17 0950 09/08/17 1040   09/08/17 0947   vancomycin (VANCOCIN) 1-5 GM/200ML-% IVPB  Status:  Discontinued    Comments:  Bridget Hartshorn   : cabinet override      09/08/17 0947 09/08/17 0956   09/08/17 0941  vancomycin (VANCOCIN) 1-5 GM/200ML-% IVPB    Comments:  Dellie Catholic   : cabinet override      09/08/17 0941 09/08/17 1000   09/08/17 0810  ceFAZolin (ANCEF) IVPB 2g/100 mL premix     2 g 200 mL/hr over 30 Minutes Intravenous On call to O.R. 09/08/17 0811 09/08/17 1050       Subjective: Wanting ice chips  Objective: Vitals:   09/16/17 1308 09/16/17 1400 09/16/17 1600 09/16/17 1700  BP: 96/61 (!) 105/57 (!) 101/48 (!) 101/46  Pulse:  (!) 111 (!) 107 (!) 110  Resp: 15 20 17 19   Temp:   98 F (36.7 C)   TempSrc:   Oral   SpO2:  100% 100% 98%  Weight:      Height:        Intake/Output Summary (Last 24 hours) at 09/16/17 1840 Last data filed at 09/16/17 1800  Gross per 24 hour  Intake          2262.83 ml  Output             5825 ml  Net         -3562.17 ml   Filed Weights   09/08/17 0825 09/15/17 0413  Weight: 60.3 kg (133 lb) 70.7 kg (155 lb 13.8 oz)    Examination:  General exam: Appears calm and comfortable  Respiratory system: Clear to auscultation. Respiratory effort normal. Cardiovascular system: S1 & S2 heard, RRR Gastrointestinal system: Abdomen is nondistended, soft and nontender. No organomegaly or masses felt. Normal bowel sounds heard. Central nervous system: Alert and oriented. No focal neurological deficits. Extremities: Symmetric 5 x 5 power. Skin: No rashes, lesions Psychiatry: Judgement and insight appear normal. Mood & affect appropriate.   Data Reviewed: I have personally reviewed following labs and imaging studies  CBC:  Recent Labs Lab 09/10/17 0851  09/11/17 0509  09/12/17 2155 09/13/17 0500 09/14/17 0347 09/15/17 0400 09/16/17 0545  WBC 50.4*  --  23.4*  < > 17.7* 16.0* 15.2* 16.6* 20.8*  NEUTROABS 45.9*  --  19.7*  --   --   --  11.4* 11.3* 14.1*  HGB 10.1*  < > 7.7*   < > 10.2* 10.1* 10.4* 10.9* 10.5*  HCT 30.9*  < > 23.0*  < > 30.1* 29.8* 31.4* 32.7* 31.2*  MCV 90.1  --  87.5  < > 86.0 86.1 85.8 85.8 86.0  PLT 209  --  140*  < > 112* 100* 83* 69* 51*  < > = values in this interval not displayed. Basic Metabolic Panel:  Recent Labs Lab 09/12/17 0415 09/13/17 0500 09/14/17 0347 09/15/17  0400 09/16/17 0545  NA 147* 149* 150* 146* 145  K 3.7 3.3* 3.2* 2.9* 2.9*  CL 121* 121* 124* 116* 108  CO2 18* 19* 17* 20* 24  GLUCOSE 106* 104* 114* 164* 123*  BUN 23* 14 11 12 18   CREATININE 2.13* 1.76* 1.59* 1.70* 1.78*  CALCIUM 7.3* 7.5* 7.3* 7.7* 7.5*  MG 2.0 1.8 1.8 1.5* 1.8  PHOS 3.8 4.0 3.5 2.8 3.3   GFR: Estimated Creatinine Clearance: 29.1 mL/min (A) (by C-G formula based on SCr of 1.78 mg/dL (H)). Liver Function Tests:  Recent Labs Lab 09/10/17 0851 09/11/17 0509 09/13/17 0500  AST 48* 45* 19  ALT 21 29 14   ALKPHOS 91 93 75  BILITOT 0.6 0.7 1.2  PROT 5.9* 4.4* 5.7*  ALBUMIN 2.4* 1.8* 3.2*   No results for input(s): LIPASE, AMYLASE in the last 168 hours. No results for input(s): AMMONIA in the last 168 hours. Coagulation Profile: No results for input(s): INR, PROTIME in the last 168 hours. Cardiac Enzymes:  Recent Labs Lab 09/10/17 0003 09/10/17 0337  TROPONINI <0.03 <0.03   BNP (last 3 results) No results for input(s): PROBNP in the last 8760 hours. HbA1C: No results for input(s): HGBA1C in the last 72 hours. CBG:  Recent Labs Lab 09/15/17 2327 09/16/17 0314 09/16/17 0742 09/16/17 1144 09/16/17 1657  GLUCAP 85 122* 126* 101* 69   Lipid Profile: No results for input(s): CHOL, HDL, LDLCALC, TRIG, CHOLHDL, LDLDIRECT in the last 72 hours. Thyroid Function Tests: No results for input(s): TSH, T4TOTAL, FREET4, T3FREE, THYROIDAB in the last 72 hours. Anemia Panel: No results for input(s): VITAMINB12, FOLATE, FERRITIN, TIBC, IRON, RETICCTPCT in the last 72 hours. Sepsis Labs:  Recent Labs Lab 09/10/17 0420  09/11/17 0509  PROCALCITON 57.49 52.02    Recent Results (from the past 240 hour(s))  Culture, blood (Routine X 2) w Reflex to ID Panel     Status: Abnormal   Collection Time: 09/09/17  2:09 PM  Result Value Ref Range Status   Specimen Description BLOOD RIGHT ANTECUBITAL  Final   Special Requests IN PEDIATRIC BOTTLE Blood Culture adequate volume  Final   Culture  Setup Time   Final    BUDDING YEAST SEEN IN PEDIATRIC BOTTLE CRITICAL RESULT CALLED TO, READ BACK BY AND VERIFIED WITH: N GLOGOVAC,PHARMD AT 1652 09/11/17 BY L BENFIELD Performed at [facility], [place] [address], [place], [place] [address]    Culture CANDIDA GLABRATA (A)  Final   Report Status 09/13/2017 FINAL  Final  Blood Culture ID Panel (Reflexed)     Status: Abnormal   Collection Time: 09/09/17  2:09 PM  Result Value Ref Range Status   Enterococcus species NOT DETECTED NOT DETECTED Final   Listeria monocytogenes NOT DETECTED NOT DETECTED Final   Staphylococcus species NOT DETECTED NOT DETECTED Final   Staphylococcus aureus NOT DETECTED NOT DETECTED Final   Streptococcus species NOT DETECTED NOT DETECTED Final   Streptococcus agalactiae NOT DETECTED NOT DETECTED Final   Streptococcus pneumoniae NOT DETECTED NOT DETECTED Final   Streptococcus pyogenes NOT DETECTED NOT DETECTED Final   Acinetobacter baumannii NOT DETECTED NOT DETECTED Final   Enterobacteriaceae species NOT DETECTED NOT DETECTED Final   Enterobacter cloacae complex NOT DETECTED NOT DETECTED Final   Escherichia coli NOT DETECTED NOT DETECTED Final   Klebsiella oxytoca NOT DETECTED NOT DETECTED Final   Klebsiella pneumoniae NOT DETECTED NOT DETECTED Final   Proteus species NOT DETECTED NOT DETECTED Final   Serratia marcescens NOT DETECTED NOT  DETECTED Final   Haemophilus influenzae NOT DETECTED NOT DETECTED Final   Neisseria meningitidis NOT DETECTED NOT DETECTED Final   Pseudomonas aeruginosa NOT DETECTED NOT DETECTED Final   Candida albicans NOT  DETECTED NOT DETECTED Final   Candida glabrata DETECTED (A) NOT DETECTED Final    Comment: CRITICAL RESULT CALLED TO, READ BACK BY AND VERIFIED WITH: N GLOGOVAC,PHARMD AT 1652 09/11/17 BY L BENFIELD    Candida krusei NOT DETECTED NOT DETECTED Final   Candida parapsilosis NOT DETECTED NOT DETECTED Final   Candida tropicalis NOT DETECTED NOT DETECTED Final    Comment: Performed at [facility], [address] [place], [place] [address]  Culture, blood (Routine X 2) w Reflex to ID Panel     Status: Abnormal   Collection Time: 09/09/17  2:34 PM  Result Value Ref Range Status   Specimen Description BLOOD RIGHT HAND  Final   Special Requests IN PEDIATRIC BOTTLE Blood Culture adequate volume  Final   Culture  Setup Time   Final    YEAST IN PEDIATRIC BOTTLE CRITICAL RESULT CALLED TO, READ BACK BY AND VERIFIED WITH: J. GADHIA,PHARMD 1947 09/11/2017 T. TYSOR    Culture CANDIDA GLABRATA (A)  Final   Report Status 09/14/2017 FINAL  Final  Culture, Urine     Status: None   Collection Time: 09/10/17  1:49 PM  Result Value Ref Range Status   Specimen Description URINE, RANDOM  Final   Special Requests NONE  Final   Culture   Final    NO GROWTH Performed at [facility], [place] [name], [place], [place] [address]    Report Status 09/11/2017 FINAL  Final  Culture, blood (routine x 2)     Status: None (Preliminary result)   Collection Time: 09/15/17  8:23 AM  Result Value Ref Range Status   Specimen Description BLOOD LEFT HAND  Final   Special Requests IN PEDIATRIC BOTTLE Blood Culture adequate volume  Final   Culture   Final    NO GROWTH 1 DAY Performed at [facility], [place] [address], [place], [place] [address]    Report Status PENDING  Incomplete  Culture, blood (routine x 2)     Status: None (Preliminary result)   Collection Time: 09/15/17  8:23 AM  Result Value Ref Range Status   Specimen Description BLOOD LEFT HAND  Final   Special Requests IN PEDIATRIC BOTTLE Blood  Culture adequate volume  Final   Culture   Final    NO GROWTH 1 DAY Performed at [facility], [place] [address], [place], [place] [address]    Report Status PENDING  Incomplete  C difficile quick scan w PCR reflex     Status: None   Collection Time: 09/16/17  2:15 PM  Result Value Ref Range Status   C Diff antigen NEGATIVE NEGATIVE Final   C Diff toxin NEGATIVE NEGATIVE Final   C Diff interpretation No C. difficile detected.  Final     Radiology Studies: No results found.  Scheduled Meds: . chlorhexidine gluconate (MEDLINE KIT)  15 mL Mouth Rinse BID  . Chlorhexidine Gluconate Cloth  6 each Topical Q0600  . furosemide  40 mg Intravenous Q12H  . insulin aspart  0-9 Units Subcutaneous Q4H  . iopamidol      . mouth rinse  15 mL Mouth Rinse QID  . mupirocin ointment  1 application Nasal BID  . pantoprazole (PROTONIX) IV  40 mg Intravenous Q24H  .  pneumococcal 23 valent vaccine  0.5 mL Intramuscular Tomorrow-1000  . potassium chloride  40 mEq Oral BID  . sodium chloride flush  10-40 mL Intracatheter Q12H   Continuous Infusions: . anidulafungin Stopped (09/16/17 1544)  . dextrose 5 % and 0.45% NaCl 10 mL/hr at 09/16/17 0539  . feeding supplement (VITAL 1.5 CAL) Stopped (09/16/17 1330)  . piperacillin-tazobactam (ZOSYN)  IV Stopped (09/16/17 1602)     LOS: 7 days   CHIU, Orpah Melter, MD [facility] Pager [phone]  If 7PM-7AM, please contact night-coverage [email or web address] Password [email or web address] 09/16/2017, 6:40 PM

## 2017-09-16 NOTE — Progress Notes (Signed)
Nutrition Follow-up  DOCUMENTATION CODES:   Not applicable  INTERVENTION:  - Will change TF order: Vital 1.5 @ 30 mL/hr to advance by 10 mL every 8 hours to reach goal rate of Vital 1.5 @ 50 mL/hr. At goal rate, this regimen will provide 1800 kcal, 81 grams of protein, and 917 mL free water.   Monitor magnesium, potassium, and phosphorus daily for at least 3 days, MD to replete as needed, as pt is at risk for refeeding syndrome given TF was started at goal rate which led to hypomagnesemia and hypophosphatemia (now resolved) and persistent hypokalemia.   NUTRITION DIAGNOSIS:   Inadequate oral intake related to inability to eat as evidenced by NPO status. -ongoing  GOAL:   Patient will meet greater than or equal to 90% of their needs -met with TF regimen  MONITOR:   TF tolerance, Weight trends, Labs, I & O's  ASSESSMENT:   Pt with PMH significant for Crohn's disease, hiatal hernia, CKD III, and SBO. Presents this admission s/p exloratory laparotomy that revealed frank pus in abdomen resulting in septic shock. See significant events below.    10/3: laparoscopic hernia repair/placement of G-tube and gastropexy, lysis of adhesions 10/4: Pt transferred to ICU stepdown 10/5: ex lap (reveals frank pus), takedown of gastrostomy and repair of gastrotomy, washout and placement of mediastinal drains, hepatorrhaphy x 2, intubated, PICC placed 10/6: extubated 10/9: TF started via NGT   10/11 Pt remains NPO with NGT in place. TF was ordered by this RD as outlined above, but it appears that Osmolite 1.2 was started at goal rate of 60 mL/hr and has remained at this rate since initiation. This is highly likely to be the reason for signs of refeeding and ongoing hypokalemia. Osmolite 1.2 @ 60 mL/hr is providing 1728 kcal, 80 grams of protein, and 1181 mL free water.   Pt denies nausea. She states intermittent stomach pain which she describes as pressure-like, not stabbing pain. She states that  pain comes and goes and that she is unsure of any factors that cause it to start or stop and denies pain being consistent with any particular part of the day. RN is aware of this.   New weight was obtained yesterday and is +22 lbs/10.4 kg from admission. Dr. Golden Pop note from yesterday states anasarca/severe third spacing. Will adjust TF regimen as outlined above to assist with decreasing fluids provided.   Medications reviewed; 40 mg IV Lasix BID, sliding scale Novolog, 4 g IV Mg sulfate x1 run yesterday, 40 mg IV Protonix/day, 10 mEq IV KCl x4 runs today.  Labs reviewed; CBGs: 122 and 126 mg/dL this AM, K: 2.9 mmol/L, creatinine: 1.78 mg/dL, Ca: 7.5 mg/dL, GFR: 32 mL/min.   IVF: D5-1/2 NS @ 10 mL/hr (41 kcal from dextrose).    10/9 - No family/visitors present at this time and pt sleeping soundly.  - NGT in place. Consult received for TF and TF order : Osmolite 1.2 @ 10 mL/hr to increase by 10 mL every 12 hours to reach goal rate of Osmolite 1.2 @ 60 mL/hr.  - Refeeding risk necessitating slow TF advancement.   - No updated weight since 10/3.  - Estimated nutrition needs updated based on extubation.  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 - Surgeon note from today states "if bowel gas pattern seems appropriate, may start TFs today."  - Dr.  Ramaswamy's note from this AM states risk for need for re-intubation and that pt is a poor BiPAP candidate.   2 g IV Mg sulfate x1 run today, 10 mEq IV KCl x5 runs yesterday and x6 runs today. K: 3.2 mmol/L IVF: D10 @ 75 mL/hr (612 kcal from dextrose).   10/5 Patient is currently intubated on ventilator support MV: 10.9L/min Temp (24hrs), Avg:98 F (36.7 C), Min:97.5 F (36.4 C), Max:98.6 F (37 C) Propofol: None BP: 117/49 MAP: 67  - No family at bedside to obtain nutrition history.  - Weight noted to be  increasing from 06/2017.   Nutrition-Focused physical exam completed. Findings are no fat depletion, nomuscle depletion, and mildedema. Fluid accumulation may be masking losses.    Diet Order:  Diet NPO time specified  Skin:   (multiple closed incisions abdomen)  Last BM:  10/11  Height:   Ht Readings from Last 1 Encounters:  09/10/17 5' 4.5" (1.638 m)    Weight:   Wt Readings from Last 1 Encounters:  09/15/17 155 lb 13.8 oz (70.7 kg)    Ideal Body Weight:  54.5 kg  BMI:  Body mass index is 26.34 kg/m.  Estimated Nutritional Needs:   Kcal:  1690-1930 (28-32 kcal/kg)  Protein:  72-84 grams (1.2-1.4 grams/kg)  Fluid:  >/= 1.8 L/day  EDUCATION NEEDS:   No education needs identified at this time    Jarome Matin, MS, RD, LDN, CNSC Inpatient Clinical Dietitian Pager # [phone] After hours/weekend pager # [phone]

## 2017-09-17 ENCOUNTER — Encounter (HOSPITAL_COMMUNITY): Payer: Self-pay | Admitting: Radiology

## 2017-09-17 ENCOUNTER — Inpatient Hospital Stay (HOSPITAL_COMMUNITY): Payer: Medicare HMO

## 2017-09-17 DIAGNOSIS — Z452 Encounter for adjustment and management of vascular access device: Secondary | ICD-10-CM

## 2017-09-17 DIAGNOSIS — D696 Thrombocytopenia, unspecified: Secondary | ICD-10-CM

## 2017-09-17 LAB — PROTIME-INR
INR: 1.24
Prothrombin Time: 15.5 seconds — ABNORMAL HIGH (ref 11.4–15.2)

## 2017-09-17 LAB — GLUCOSE, CAPILLARY
GLUCOSE-CAPILLARY: 114 mg/dL — AB (ref 65–99)
GLUCOSE-CAPILLARY: 70 mg/dL (ref 65–99)
GLUCOSE-CAPILLARY: 57 mg/dL — AB (ref 65–99)
GLUCOSE-CAPILLARY: 61 mg/dL — AB (ref 65–99)
GLUCOSE-CAPILLARY: 86 mg/dL (ref 65–99)
Glucose-Capillary: 96 mg/dL (ref 65–99)
Glucose-Capillary: 73 mg/dL (ref 65–99)

## 2017-09-17 LAB — BASIC METABOLIC PANEL
Anion gap: 14 (ref 5–15)
BUN: 25 mg/dL — ABNORMAL HIGH (ref 6–20)
CHLORIDE: 102 mmol/L (ref 101–111)
CO2: 28 mmol/L (ref 22–32)
CREATININE: 1.94 mg/dL — AB (ref 0.44–1.00)
Calcium: 7.6 mg/dL — ABNORMAL LOW (ref 8.9–10.3)
GFR calc non Af Amer: 25 mL/min — ABNORMAL LOW (ref >60)
GFR, EST AFRICAN AMERICAN: 29 mL/min — AB (ref >60)
Glucose, Bld: 112 mg/dL — ABNORMAL HIGH (ref 65–99)
POTASSIUM: 3.5 mmol/L (ref 3.5–5.1)
Sodium: 144 mmol/L (ref 135–145)

## 2017-09-17 LAB — MAGNESIUM: Magnesium: 1.6 mg/dL — ABNORMAL LOW (ref 1.7–2.4)

## 2017-09-17 LAB — CBC WITH DIFFERENTIAL/PLATELET
BASOS PCT: 0 %
Basophils Absolute: 0 10*3/uL (ref 0.0–0.1)
EOS ABS: 0.4 10*3/uL (ref 0.0–0.7)
EOS PCT: 2 %
HEMATOCRIT: 30.5 % — AB (ref 36.0–46.0)
Hemoglobin: 10.2 g/dL — ABNORMAL LOW (ref 12.0–15.0)
Lymphocytes Relative: 21 %
Lymphs Abs: 4.7 10*3/uL — ABNORMAL HIGH (ref 0.7–4.0)
MCH: 29 pg (ref 26.0–34.0)
MCHC: 33.4 g/dL (ref 30.0–36.0)
MCV: 86.6 fL (ref 78.0–100.0)
MONO ABS: 1.6 10*3/uL — AB (ref 0.1–1.0)
MONOS PCT: 7 %
NEUTROS ABS: 15.7 10*3/uL — AB (ref 1.7–7.7)
Neutrophils Relative %: 70 %
PLATELETS: 36 10*3/uL — AB (ref 150–400)
RBC: 3.52 MIL/uL — ABNORMAL LOW (ref 3.87–5.11)
RDW: 16.3 % — AB (ref 11.5–15.5)
WBC: 22.3 10*3/uL — ABNORMAL HIGH (ref 4.0–10.5)

## 2017-09-17 LAB — PHOSPHORUS: PHOSPHORUS: 4.7 mg/dL — AB (ref 2.5–4.6)

## 2017-09-17 MED ORDER — MIDAZOLAM HCL 2 MG/2ML IJ SOLN
1.0000 mg | Freq: Once | INTRAMUSCULAR | Status: AC
  Administered 2017-09-17: 1 mg via INTRAVENOUS

## 2017-09-17 MED ORDER — MIDAZOLAM HCL 2 MG/2ML IJ SOLN
INTRAMUSCULAR | Status: AC
  Filled 2017-09-17: qty 4

## 2017-09-17 MED ORDER — FENTANYL CITRATE (PF) 100 MCG/2ML IJ SOLN
INTRAMUSCULAR | Status: AC
  Filled 2017-09-17: qty 2

## 2017-09-17 MED ORDER — MAGNESIUM SULFATE 2 GM/50ML IV SOLN
2.0000 g | Freq: Once | INTRAVENOUS | Status: AC
  Administered 2017-09-17: 2 g via INTRAVENOUS
  Filled 2017-09-17: qty 50

## 2017-09-17 MED ORDER — FENTANYL CITRATE (PF) 100 MCG/2ML IJ SOLN
25.0000 ug | Freq: Once | INTRAMUSCULAR | Status: AC
  Administered 2017-09-17: 25 ug via INTRAVENOUS

## 2017-09-17 MED ORDER — DEXTROSE 50 % IV SOLN
INTRAVENOUS | Status: AC
  Filled 2017-09-17: qty 50

## 2017-09-17 MED ORDER — FUROSEMIDE 10 MG/ML IJ SOLN
40.0000 mg | Freq: Every day | INTRAMUSCULAR | Status: DC
  Administered 2017-09-17: 40 mg via INTRAVENOUS
  Filled 2017-09-17: qty 4

## 2017-09-17 MED ORDER — SODIUM CHLORIDE 0.9% FLUSH
10.0000 mL | Freq: Three times a day (TID) | INTRAVENOUS | Status: DC
  Administered 2017-09-17 – 2017-09-24 (×17): 10 mL via INTRAVENOUS

## 2017-09-17 MED ORDER — DEXTROSE 50 % IV SOLN
25.0000 mL | Freq: Once | INTRAVENOUS | Status: AC
  Administered 2017-09-17: 25 mL via INTRAVENOUS

## 2017-09-17 MED ORDER — FENTANYL CITRATE (PF) 100 MCG/2ML IJ SOLN
50.0000 ug | Freq: Once | INTRAMUSCULAR | Status: AC
  Administered 2017-09-17: 50 ug via INTRAVENOUS

## 2017-09-17 MED ORDER — SODIUM CHLORIDE 0.9 % IV SOLN
Freq: Once | INTRAVENOUS | Status: AC
  Administered 2017-09-17: 14:00:00 via INTRAVENOUS

## 2017-09-17 NOTE — Progress Notes (Signed)
Nutrition Follow-up  DOCUMENTATION CODES:   Not applicable  INTERVENTION:   - Continue TF order: Vital 1.5 @ 40 mL and advance by 10 mL (1200) to goal rate of Vital 1.5 @ 50 mL/hour. At goal rate, this regimen will provide 1800 kcal, 81 grams protein, and 917 mL free water.  - Continue to monitor magnesium, potassium, and phosphorus daily, MD replete as needed  NUTRITION DIAGNOSIS:   Inadequate oral intake related to inability to eat as evidenced by NPO status. Progressing- pt on full liquid  GOAL:   Patient will meet greater than or equal to 90% of their needs  Met for protein, unmet for kcals at current rate; both will be met once goal rate is acheived  MONITOR:   TF tolerance, PO intake, Weight trends, Labs, I & O's  ASSESSMENT:   Pt with PMH significant for Crohn's disease, hiatal hernia, CKD III, and SBO. Presents this admission s/p exloratory laparotomy that revealed frank pus in abdomen resulting in septic shock. See significant events below.    Pt advanced to full liquid diet from NPO at 2:00 PM on 10/11. NGT in place. Pt was weighed at 134 lbs this morning, this is consistent from admission weight.  Per RN note, at 1:45 pm on 10/11 pt began to cough and gag up thick mucous. MD ordered the TF to cease for four hours to check residuals. D/t them being <150, TF was resumed after two hours.  Pt seems to be improving overall. Pt reports that her TF is going well. She is experiencing nausea today but has not vomited. Pt reports that she drank one cup of Sprite yesterday and one cup of water. Pt has continued pain in her abdomen which she described as a stabbing pain, in one location on her abdomen. Pt pointed to middle left side of abdomen. RN was administering pain medicine as I left.   Pts RN reports that her TF is going well and pt seems to be tolerating it. At 1200 tube feeding is to be increased to goal rate of 50 mL/hour and stopped after 8 hours if well tolerated. Pt has  not received tray yet so has not had the opportunity to try more liquids today.  Pt is currently receiving 40 mL/hr of Vital 1.5 which provides 1,440 kcals (85% of minimum estimated needs) and 65 grams protein (90% of minimum estimated needs)  Lasix was decreased to once per day from twice a day. Over 6 L urine output overnight per MD note.   Medications: Lasix 40 mg x1 per day, Novolog, Protonix, KCl 40 mEq BID, Mg 2 g x 1 run  Labs:  Mg 1.6 (L), Phos 4.7 (H), K 3.5 WDL, CBG 114 (H), BUN 25 (H), Cr 1.94 (H), GFR 25 (L)  IVF: D5- 1/2 NS @ 10 mL/hr (41 kcal from dextrose)  Diet Order:  Diet full liquid Room service appropriate? Yes; Fluid consistency: Thin  Skin:   (multiple closed incisions abdomen)  Last BM:  09/17/17  Height:   Ht Readings from Last 1 Encounters:  09/10/17 5' 4.5" (1.638 m)    Weight:   Wt Readings from Last 1 Encounters:  09/17/17 134 lb 0.6 oz (60.8 kg)    Ideal Body Weight:  54.5 kg  BMI:  Body mass index is 22.65 kg/m.  Estimated Nutritional Needs:   Kcal:  1690-1930 (28-32 kcal/kg)  Protein:  72-84 grams (1.2-1.4 grams/kg)  Fluid:  >/= 1.8 L/day  EDUCATION NEEDS:   No  education needs identified at this time  [place] Intern Pager: [phone] 09/17/2017 10:19 AM

## 2017-09-17 NOTE — Progress Notes (Signed)
[name] for Infectious Disease   Reason for visit: Follow up on leukocytosis  Interval History: WBC up to 22, CT with fluid collection and IR to drain; no complaints  Physical Exam: Constitutional:  Vitals:   09/17/17 1400 09/17/17 1415  BP: (!) 113/43   Pulse: (!) 114 (!) 116  Resp: (!) 26 (!) 25  Temp:    SpO2: 98% 95%   patient appears in NAD    Lab Results  Component Value Date   WBC 22.3 (H) 09/17/2017   HGB 10.2 (L) 09/17/2017   HCT 30.5 (L) 09/17/2017   MCV 86.6 09/17/2017   PLT 36 (L) 09/17/2017    Lab Results  Component Value Date   CREATININE 1.94 (H) 09/17/2017   BUN 25 (H) 09/17/2017   NA 144 09/17/2017   K 3.5 09/17/2017   CL 102 09/17/2017   CO2 28 09/17/2017    Lab Results  Component Value Date   ALT 14 09/13/2017   AST 19 09/13/2017   ALKPHOS 75 09/13/2017     Microbiology: Recent Results (from the past 240 hour(s))  Culture, blood (Routine X 2) w Reflex to ID Panel     Status: Abnormal   Collection Time: 09/09/17  2:09 PM  Result Value Ref Range Status   Specimen Description BLOOD RIGHT ANTECUBITAL  Final   Special Requests IN PEDIATRIC BOTTLE Blood Culture adequate volume  Final   Culture  Setup Time   Final    BUDDING YEAST SEEN IN PEDIATRIC BOTTLE CRITICAL RESULT CALLED TO, READ BACK BY AND VERIFIED WITH: N GLOGOVAC,PHARMD AT 1652 09/11/17 BY L BENFIELD Performed at [facility], [place] [address] [place], [place] [address]    Culture CANDIDA GLABRATA (A)  Final   Report Status 09/13/2017 FINAL  Final  Blood Culture ID Panel (Reflexed)     Status: Abnormal   Collection Time: 09/09/17  2:09 PM  Result Value Ref Range Status   Enterococcus species NOT DETECTED NOT DETECTED Final   Listeria monocytogenes NOT DETECTED NOT DETECTED Final   Staphylococcus species NOT DETECTED NOT DETECTED Final   Staphylococcus aureus NOT DETECTED NOT DETECTED Final   Streptococcus species NOT DETECTED NOT DETECTED Final   Streptococcus  agalactiae NOT DETECTED NOT DETECTED Final   Streptococcus pneumoniae NOT DETECTED NOT DETECTED Final   Streptococcus pyogenes NOT DETECTED NOT DETECTED Final   Acinetobacter baumannii NOT DETECTED NOT DETECTED Final   Enterobacteriaceae species NOT DETECTED NOT DETECTED Final   Enterobacter cloacae complex NOT DETECTED NOT DETECTED Final   Escherichia coli NOT DETECTED NOT DETECTED Final   Klebsiella oxytoca NOT DETECTED NOT DETECTED Final   Klebsiella pneumoniae NOT DETECTED NOT DETECTED Final   Proteus species NOT DETECTED NOT DETECTED Final   Serratia marcescens NOT DETECTED NOT DETECTED Final   Haemophilus influenzae NOT DETECTED NOT DETECTED Final   Neisseria meningitidis NOT DETECTED NOT DETECTED Final   Pseudomonas aeruginosa NOT DETECTED NOT DETECTED Final   Candida albicans NOT DETECTED NOT DETECTED Final   Candida glabrata DETECTED (A) NOT DETECTED Final    Comment: CRITICAL RESULT CALLED TO, READ BACK BY AND VERIFIED WITH: N GLOGOVAC,PHARMD AT 1652 09/11/17 BY L BENFIELD    Candida krusei NOT DETECTED NOT DETECTED Final   Candida parapsilosis NOT DETECTED NOT DETECTED Final   Candida tropicalis NOT DETECTED NOT DETECTED Final    Comment: Performed at [facility], [address], [place], [place] [address]  Culture, blood (Routine X 2)  w Reflex to ID Panel     Status: Abnormal   Collection Time: 09/09/17  2:34 PM  Result Value Ref Range Status   Specimen Description BLOOD RIGHT HAND  Final   Special Requests IN PEDIATRIC BOTTLE Blood Culture adequate volume  Final   Culture  Setup Time   Final    YEAST IN PEDIATRIC BOTTLE CRITICAL RESULT CALLED TO, READ BACK BY AND VERIFIED WITH: J. GADHIA,PHARMD 1947 09/11/2017 T. TYSOR    Culture CANDIDA GLABRATA (A)  Final   Report Status 09/14/2017 FINAL  Final  Culture, Urine     Status: None   Collection Time: 09/10/17  1:49 PM  Result Value Ref Range Status   Specimen Description URINE, RANDOM  Final   Special Requests  NONE  Final   Culture   Final    NO GROWTH Performed at [facility], [place] [address] [place], [place] [address]    Report Status 09/11/2017 FINAL  Final  Culture, blood (routine x 2)     Status: None (Preliminary result)   Collection Time: 09/15/17  8:23 AM  Result Value Ref Range Status   Specimen Description BLOOD LEFT HAND  Final   Special Requests IN PEDIATRIC BOTTLE Blood Culture adequate volume  Final   Culture   Final    NO GROWTH 1 DAY Performed at [facility], [place] [address], [place], [place] [address]    Report Status PENDING  Incomplete  Culture, blood (routine x 2)     Status: None (Preliminary result)   Collection Time: 09/15/17  8:23 AM  Result Value Ref Range Status   Specimen Description BLOOD LEFT HAND  Final   Special Requests IN PEDIATRIC BOTTLE Blood Culture adequate volume  Final   Culture   Final    NO GROWTH 1 DAY Performed at [facility], [employer] [address] [place], [place] [address]    Report Status PENDING  Incomplete  C difficile quick scan w PCR reflex     Status: None   Collection Time: 09/16/17  2:15 PM  Result Value Ref Range Status   C Diff antigen NEGATIVE NEGATIVE Final   C Diff toxin NEGATIVE NEGATIVE Final   C Diff interpretation No C. difficile detected.  Final    Impression/Plan:  1. Leukocytosis - worsening and concern for abscess.  Continue zosyn Off of linezolid  2.  Low platelets - concern that it is due to linezolid and now off.  Down to 34 today.    No changes.  Dr Tommy Medal will monitor the cultures over the weekend and make changes as needed.

## 2017-09-17 NOTE — Progress Notes (Signed)
CSW met with patient at bedside explain role and reason for visit-discuss SNF options.Patient reports, " I want to go home when I leave here." The patient reports she lives at home with her sister and plans to return home. She maybe open to [place] services closer to discharge.  CSW signing off at this. Please re-consult social work if needs arise.   Kathrin Greathouse, Latanya Presser, MSW Clinical Social Worker  [phone] 09/17/2017  12:42 PM

## 2017-09-17 NOTE — Procedures (Signed)
Interventional Radiology Procedure Note  Procedure: CT guided drainage of pelvic peritoneal abscess  Complications: None  Estimated Blood Loss: < 10 mL  Pelvic fluid collection approached from right transgluteal approach, yielding bloody and slightly turbid fluid. 10 Fr drain placed and attached to suction bulb drainage.  Sample of fluid sent for culture.  Venetia Night. Kathlene Cote, M.D Pager:  [phone]

## 2017-09-17 NOTE — Consult Note (Signed)
Chief Complaint: Patient was seen in consultation today for  CT-guided drainage of pelvic fluid collection  Referring Physician(s): Ramirez,A  Supervising Physician: Aletta Edouard  Patient Status: [facility] - In-pt  History of Present Illness: Danielle [name] is a 71 y.o. female with history of Crohn's disease, psoriasis and tight 4 paraesophageal hiatal hernia. On 09/08/17 she underwent diagnostic laparoscopy, G-tube placement, gastropexy, lysis of adhesions during hiatal hernia repair. She subsequently developed septic shock, VDRF,acute on CKD, pneumoperitoneum with purulent peritonitis. On 09/10/17 she underwent exploratory lap, takedown of gastrostomy and repair of gastrotomy, washout and placement of mediastinal drains, hepatorrhaphy x2. She has since been extubated but now has worsening leukocytosis as well as thrombocytopenia. CT of the abdomen and pelvis performed yesterday revealed complex postop fluid collection in the large hiatal hernia possibly representing ascites versus edematous mesentery versus hemorrhage. Also present was 8.7 cm cul-de-sac collection, possibly abscess as well as changes of colitis and increase in bilateral pleural effusions. Request now received from surgery for image guided drainage of the pelvic fluid collection.   Past Medical History:  Diagnosis Date  . Crohn disease ([place])   . Hiatal hernia 12/07/2015  . Psoriasis     Past Surgical History:  Procedure Laterality Date  . ANAL STENOSIS  ~ 2010  . APPENDECTOMY  1980's  . BOWEL RESECTION  2009  . CHOLECYSTECTOMY  1990's  . COLONOSCOPY  2013  . COLONOSCOPY WITH PROPOFOL N/A 03/12/2017   Procedure: COLONOSCOPY WITH PROPOFOL;  Surgeon: Rogene Houston, MD;  [facility];  Service: Endoscopy;  Laterality: N/A;  2:05-moved up to 1025 per Lelon Frohlich  . COLOSTOMY  2009   DEMASO  . COLOSTOMY TAKEDOWN  2009   "only had it for about 1 month" (08/22/2013)  . ESOPHAGEAL DILATION N/A 03/12/2017   Procedure:  ESOPHAGEAL DILATION;  Surgeon: Rogene Houston, MD;  [facility];  Service: Endoscopy;  Laterality: N/A;  . ESOPHAGOGASTRODUODENOSCOPY  11/11/2011   Procedure: ESOPHAGOGASTRODUODENOSCOPY (EGD);  Surgeon: Missy Sabins, MD;  Location: [facility] ENDOSCOPY;  Service: Endoscopy;  Laterality: N/A;  . ESOPHAGOGASTRODUODENOSCOPY  05/18/2012   Procedure: ESOPHAGOGASTRODUODENOSCOPY (EGD);  Surgeon: Rogene Houston, MD;  [facility];  Service: Endoscopy;  Laterality: N/A;  200  . ESOPHAGOGASTRODUODENOSCOPY (EGD) WITH PROPOFOL N/A 03/12/2017   Procedure: ESOPHAGOGASTRODUODENOSCOPY (EGD) WITH PROPOFOL;  Surgeon: Rogene Houston, MD;  [facility];  Service: Endoscopy;  Laterality: N/A;  . FLEXIBLE SIGMOIDOSCOPY  11/11/2011   Procedure: FLEXIBLE SIGMOIDOSCOPY;  Surgeon: Missy Sabins, MD;  Location: [place];  Service: Endoscopy;  Laterality: N/A;  . goiter  1999  . HIATAL HERNIA REPAIR N/A 09/08/2017   Procedure: DIAGNOSTIC LAPAROSCOPIC PLACEMENT OF gastrostomy TUBE AND GASTROPEXY;  Surgeon: Ralene Ok, MD;  Location: [facility];  Service: General;  Laterality: N/A;  . INCISION AND DRAINAGE PERIRECTAL ABSCESS N/A 04/21/2017   Procedure: IRRIGATION AND DEBRIDEMENT PERIRECTAL ABSCESS;  Surgeon: Coralie Keens, MD;  Location: [place];  Service: General;  Laterality: N/A;  . LAPAROSCOPIC LYSIS OF ADHESIONS N/A 09/08/2017   Procedure: LAPAROSCOPIC LYSIS OF ADHESIONS;  Surgeon: Ralene Ok, MD;  Location: [facility];  Service: General;  Laterality: N/A;  . LAPAROTOMY N/A 09/10/2017   Procedure: Exploratory laparotomy with takedown of G tube, repair gastrostomy abdominal washout;  Surgeon: Clovis Riley, MD;  Location: [facility];  Service: General;  Laterality: N/A;  . LEFT HEART CATHETERIZATION WITH CORONARY ANGIOGRAM N/A 07/09/2014   Procedure: LEFT HEART CATHETERIZATION WITH CORONARY ANGIOGRAM;  Surgeon: Peter M Martinique, MD;  Location: [facility] CATH LAB;  Service: Cardiovascular;  Laterality:  N/A;  . PROCTOSCOPY N/A 01/31/2015   Procedure: RIGID PROCTOSCOPY;  Surgeon: Leighton Ruff, MD;  Location: [place];  Service: General;  Laterality: N/A;  . RECTAL EXAM UNDER ANESTHESIA N/A 01/31/2015   Procedure: RECTAL EXAM UNDER ANESTHESIA WITH PLACEMENT OF A SETON;  Surgeon: Leighton Ruff, MD;  Location: [place];  Service: General;  Laterality: N/A;    Allergies: Zithromax [azithromycin]; Aspirin; Ibuprofen; Iron; and Sulfa antibiotics  Medications: Prior to Admission medications   Medication Sig Start Date End Date Taking? Authorizing Provider  albuterol (PROVENTIL HFA;VENTOLIN HFA) 108 (90 Base) MCG/ACT inhaler Inhale 2 puffs into the lungs every 6 (six) hours as needed for wheezing. 12/09/15  Yes Rama, Venetia Maxon, MD  capsicum oleoresin (TRIXAICIN) 0.025 % cream Apply topically 3 (three) times daily. Patient taking differently: Apply 1 application topically 3 (three) times daily as needed (abscess).  06/15/17  Yes Nita Sells, MD  cyanocobalamin (,VITAMIN B-12,) 1000 MCG/ML injection INJECT 1000 MCG IM ONCE MONTHLY 07/09/17  Yes Rehman, Mechele Dawley, MD  dicyclomine (BENTYL) 10 MG/5ML syrup TAKE 10 MLS (20 MG TOTAL) BY MOUTH 3 (THREE) TIMES DAILY AS NEEDED. Patient taking differently: Take 20 mg by mouth 3 (three) times daily as needed (stomach cramps).  07/26/17  Yes Rehman, Mechele Dawley, MD  diphenoxylate-atropine (LOMOTIL) 2.5-0.025 MG tablet TAKE 1 TABLET BY MOUTH FOUR TIMES A DAY AS NEEDED Patient taking differently: Take one tablet twice daily. 06/28/17  Yes Setzer, Rona Ravens, NP  furosemide (LASIX) 20 MG tablet Take 1 tablet (20 mg total) by mouth daily as needed (Take for weight gain of more than 2 pounds in a day or 5 pounds in 2 days.). 06/16/17  Yes Lavina Hamman, MD  HYDROcodone-acetaminophen (NORCO) 7.5-325 MG tablet Take 1 tablet by mouth every 12 (twelve) hours as needed for moderate pain.   Yes [provider]  nystatin (NYSTATIN) powder Apply 1 g topically 2 (two) times  daily as needed (for itching/irritation).   Yes [provider]  pantoprazole (PROTONIX) 40 MG tablet Take 1 tablet (40 mg total) by mouth daily. 06/16/17  Yes Nita Sells, MD  Simethicone (PHAZYME PO) Take 1 tablet by mouth daily as needed (gas).    Yes [provider]  sodium bicarbonate 650 MG tablet TAKE 1 TABLET (650 MG TOTAL) BY MOUTH 2 (TWO) TIMES DAILY. 05/14/17  Yes [provider]  spironolactone (ALDACTONE) 50 MG tablet Take 50 mg by mouth daily.  06/23/17  Yes [provider]  oxyCODONE (OXY IR/ROXICODONE) 5 MG immediate release tablet Take 1-2 tablets (5-10 mg total) by mouth every 4 (four) hours as needed for moderate pain. 09/09/17   Ralene Ok, MD     Family History  Problem Relation Age of Onset  . Diabetes Mother   . Heart disease Father   . Healthy Sister   . Hypertension Brother   . Colon cancer Brother        died with colon cancer  . Crohn's disease Paternal Aunt     Social History   Social History  . Marital status: Single    Spouse name: N/A  . Number of children: N/A  . Years of education: N/A   Social History Main Topics  . Smoking status: Former Smoker    Packs/day: 0.12    Years: 4.00    Types: Cigarettes    Quit date: 10/05/1996  . Smokeless tobacco: Never  Used     Comment: 08/22/2013 1 pack every two weeks when she did smoked  . Alcohol use No  . Drug use: No  . Sexual activity: Not Currently    Birth control/ protection: Post-menopausal, Implant   Other Topics Concern  . None   Social History Narrative   Lives in [place].  Moved from [place] in 2008.  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  LIves with her sister.   No home health services.        Review of Systems see above, patient currently denies fever, headache, significant chest pain or worsening dyspnea, cough, back pain, vomiting or abnormal bleeding. She does have some periodic abdominal pain, occasional nausea.  Vital Signs: BP (!) 98/46 (BP  Location: Left Arm)   Pulse 97   Temp 98.3 F (36.8 C) (Oral)   Resp 18   Ht 5' 4.5" (1.638 m)   Wt 134 lb 0.6 oz (60.8 kg)   SpO2 98%   BMI 22.65 kg/m   Physical Exam patient awake, answers questions appropriately. NG tube in place; chest with diminished breath sounds bases, heart with tachycardic but regular rhythm. Abdomen soft, large midline dressing in place with 2 surgical drains present; abdomen with mild generalized tenderness; no significant lower extremity edema.  Imaging: Ct Angio Chest Pe W Or Wo Contrast  Result Date: 09/10/2017 CLINICAL DATA:  Shortness of breath with severe abdominal pain. Recent paraesophageal hernia repair. EXAM: CT ANGIOGRAPHY CHEST CT ABDOMEN AND PELVIS WITH CONTRAST TECHNIQUE: Multidetector CT imaging of the chest was performed using the standard protocol during bolus administration of intravenous contrast. Multiplanar CT image reconstructions and MIPs were obtained to evaluate the vascular anatomy. Multidetector CT imaging of the abdomen and pelvis was performed using the standard protocol during bolus administration of intravenous contrast. CONTRAST:  80 mL Isovue 370 IV. COMPARISON:  CT chest 05/08/2017 and CT abdomen/ pelvis 06/14/2017 FINDINGS: CTA CHEST FINDINGS Cardiovascular: Heart is normal size an compressed anteriorly by the large hiatal hernia and postoperative air and fluid over the mediastinum adjacent the distal esophagus and hiatal hernia. Minimal calcified plaque over the left anterior descending coronary artery. Thoracic aorta is within normal. No definite pulmonary emboli. Mediastinum/Nodes: No mediastinal or hilar adenopathy. There is a large collection over the mid to lower mediastinum including patient's large hiatal hernia containing contrast as well as a segment of catheter tubing likely extending up from below from patient's percutaneous gastrostomy tube. There is moderate fluid and air around the hiatal hernia and esophagus over the mid  to lower mediastinum as the prior exam demonstrated multiple additional bowel loops in this region. It is difficult to determine how much of this air-fluid is free air and fluid versus within bowel loops. No evidence of contrast extravasation. Lungs/Pleura: Examination demonstrates mild bilateral atelectatic change and small effusions. Review of the MIP images confirms the above findings. CT ABDOMEN and PELVIS FINDINGS Hepatobiliary: Previous cholecystectomy.  Liver is unremarkable. Pancreas: Pancreas is within normal. Spleen: Spleen is unremarkable. Adrenals/Urinary Tract: Adrenal glands and kidneys are within normal. Ureters and bladder are unremarkable. Stomach/Bowel: As described above as there is a persistent large hiatal hernia likely with additional bowel loops within the mid to lower mediastinum. Cannot exclude free fluid and free air in the mid to lower mediastinum. No contrast extravasation. Percutaneous gastrostomy tube over the left upper quadrant appears in adequate position. Moderate free peritoneal air over the upper abdomen compatible recent surgery and gastrostomy tube placement. Patient had a previous  partial colectomy. There are surgical clips over several small bowel loops. There are no definite dilated small bowel loops. Visualize colon is unremarkable. There is mild ascites likely from patient's recent surgery. Vascular/Lymphatic: Within normal. Reproductive: Within normal. Other: Postsurgical change over the anterior abdominal wall. Musculoskeletal: Degenerative change of the spine and hips. Stable L1 mild compression fracture. Tarlov cyst. Review of the MIP images confirms the above findings. IMPRESSION: No evidence of pulmonary embolism. Small bilateral pleural effusions with bilateral atelectasis. Persistent large hiatal hernia with large fluid and air collection within the mid to lower mediastinum some of which may be within bowel loops, although it is difficult to assess how much of this  air and fluid is free and related to patient's surgery as well as gastrostomy tube placement. There is contrast within the esophagus and hiatal hernia without contrast extravasation. Gastrostomy tube in adequate position. Postsurgical changes over the upper abdomen with free air and ascites. No evidence of bowel obstruction. Evidence of previous partial colectomy. Stable L1 compression fracture. Electronically Signed   By: Marin Olp M.D.   On: 09/10/2017 00:34   Ct Abdomen Pelvis W Contrast  Result Date: 09/16/2017 CLINICAL DATA:  admitted for surgical intervention of hiatal hernia. She had laparoscopic gastropexy, lysis of adhesions, and G tube placement. Developed septic shock and VDRF post op from pneumoperitoneum with purulent peritonitis with concern for G tube leak.HX crohns, hiatal hernia, bowel resection, appendectomy, cholecystectomy EXAM: CT ABDOMEN AND PELVIS WITH CONTRAST TECHNIQUE: Multidetector CT imaging of the abdomen and pelvis was performed using the standard protocol following bolus administration of intravenous contrast. CONTRAST:  88mL ISOVUE-300 IOPAMIDOL (ISOVUE-300) INJECTION 61% COMPARISON:  09/09/2017 FINDINGS: Lower chest: Moderate bilateral pleural effusions. Extensive atelectasis/ consolidation in the dependent aspect of both lower lobes. Right subclavian central line to the cavoatrial junction. Large hiatal hernia containing gastric fundus and a large amount of mesenteric fat, with surrounding fluid which may be ascites or hemorrhage. Nasogastric tube in place. Two surgical drains from the abdomen extend into the hiatal hernia region. Hepatobiliary: Liver unremarkable. Cholecystectomy clips. 4.4 x2.5 cm fluid collection at inferior aspect of the falciform ligament, without surrounding enhancement. No intraparenchymal liver lesion. No biliary ductal dilatation. Pancreas: Unremarkable. No pancreatic ductal dilatation or surrounding inflammatory changes. Spleen: Normal in size  without focal abnormality. Adrenals/Urinary Tract: Normal adrenals. Kidneys unremarkable. Foley catheter decompresses the urinary bladder. Stomach/Bowel: Interval removal of gastrostomy tube. Stomach is decompressed, the fundus extending into the hiatal hernia as above. Proximal small bowel decompressed. There is some distended fluid-filled loops of distal small bowel long-term including terminal ileum showing circumferential mild wall thickening. There is contiguous circumferential wall thickening in the proximal ascending colon, also segmentally in the splenic flexure of the colon. There is moderate inflammatory/edematous change around the splenic flexure without abscess. Colon is nondilated. Rectal tube in place. Vascular/Lymphatic: No significant vascular findings are present. No enlarged abdominal or pelvic lymph nodes. Reproductive: Status post hysterectomy. No adnexal masses. Other: Loculated peripherally enhancing fluid collection in the cul-de-sac measured approximately 8.7 x 2.1 cm maximum transverse dimensions. There is scattered fluid among leaves of the mesentery. No free air. Musculoskeletal: Stable mild L1 compression deformity. No acute fracture or worrisome bone lesion. 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 IMPRESSION: 1. Complex postop fluid collection in the large hiatal hernia may represent ascites and edematous mesentery versus hemorrhage. 2. 8.7 cm peripherally enhancing cul-de-sac fluid collection, possible abscess. 3. Circumferential wall thickening in distal ileum, proximal ascending colon, and splenic flexure  with surrounding inflammatory/edematous changes consistent with colitis. 4. Increase in bilateral pleural effusions. Electronically Signed   By: Lucrezia Europe M.D.   On: 09/16/2017 19:36   Ct Abdomen Pelvis W Contrast  Result Date: 09/10/2017 CLINICAL DATA:  Shortness of breath with severe abdominal pain. Recent paraesophageal hernia repair. EXAM: CT ANGIOGRAPHY CHEST CT  ABDOMEN AND PELVIS WITH CONTRAST TECHNIQUE: Multidetector CT imaging of the chest was performed using the standard protocol during bolus administration of intravenous contrast. Multiplanar CT image reconstructions and MIPs were obtained to evaluate the vascular anatomy. Multidetector CT imaging of the abdomen and pelvis was performed using the standard protocol during bolus administration of intravenous contrast. CONTRAST:  80 mL Isovue 370 IV. COMPARISON:  CT chest 05/08/2017 and CT abdomen/ pelvis 06/14/2017 FINDINGS: CTA CHEST FINDINGS Cardiovascular: Heart is normal size an compressed anteriorly by the large hiatal hernia and postoperative air and fluid over the mediastinum adjacent the distal esophagus and hiatal hernia. Minimal calcified plaque over the left anterior descending coronary artery. Thoracic aorta is within normal. No definite pulmonary emboli. Mediastinum/Nodes: No mediastinal or hilar adenopathy. There is a large collection over the mid to lower mediastinum including patient's large hiatal hernia containing contrast as well as a segment of catheter tubing likely extending up from below from patient's percutaneous gastrostomy tube. There is moderate fluid and air around the hiatal hernia and esophagus over the mid to lower mediastinum as the prior exam demonstrated multiple additional bowel loops in this region. It is difficult to determine how much of this air-fluid is free air and fluid versus within bowel loops. No evidence of contrast extravasation. Lungs/Pleura: Examination demonstrates mild bilateral atelectatic change and small effusions. Review of the MIP images confirms the above findings. CT ABDOMEN and PELVIS FINDINGS Hepatobiliary: Previous cholecystectomy.  Liver is unremarkable. Pancreas: Pancreas is within normal. Spleen: Spleen is unremarkable. Adrenals/Urinary Tract: Adrenal glands and kidneys are within normal. Ureters and bladder are unremarkable. Stomach/Bowel: As described  above as there is a persistent large hiatal hernia likely with additional bowel loops within the mid to lower mediastinum. Cannot exclude free fluid and free air in the mid to lower mediastinum. No contrast extravasation. Percutaneous gastrostomy tube over the left upper quadrant appears in adequate position. Moderate free peritoneal air over the upper abdomen compatible recent surgery and gastrostomy tube placement. Patient had a previous partial colectomy. There are surgical clips over several small bowel loops. There are no definite dilated small bowel loops. Visualize colon is unremarkable. There is mild ascites likely from patient's recent surgery. Vascular/Lymphatic: Within normal. Reproductive: Within normal. Other: Postsurgical change over the anterior abdominal wall. Musculoskeletal: Degenerative change of the spine and hips. Stable L1 mild compression fracture. Tarlov cyst. Review of the MIP images confirms the above findings. IMPRESSION: No evidence of pulmonary embolism. Small bilateral pleural effusions with bilateral atelectasis. Persistent large hiatal hernia with large fluid and air collection within the mid to lower mediastinum some of which may be within bowel loops, although it is difficult to assess how much of this air and fluid is free and related to patient's surgery as well as gastrostomy tube placement. There is contrast within the esophagus and hiatal hernia without contrast extravasation. Gastrostomy tube in adequate position. Postsurgical changes over the upper abdomen with free air and ascites. No evidence of bowel obstruction. Evidence of previous partial colectomy. Stable L1 compression fracture. Electronically Signed   By: Marin Olp M.D.   On: 09/10/2017 00:34   Dg Chest [facility]  Result Date: 09/14/2017 CLINICAL DATA:  Respiratory failure. EXAM: PORTABLE CHEST 1 VIEW COMPARISON:  09/11/2017. FINDINGS: Interim extubation. NG tube tip in the upper portion of the stomach.  Right PICC line in unchanged position with tip in right atrium. Cardiomegaly. Diffuse bilateral pulmonary infiltrates consistent pulmonary edema noted on today's exam. Bilateral pleural effusions. Findings consistent CHF. No pneumothorax. IMPRESSION: 1. Interim extubation. NG tube tip noted in the upper portion stomach. Right PICC line tip noted in unchanged position in the right atrium. 2. Cardiomegaly with diffuse pulmonary infiltrates consistent with pulmonary edema noted on today's exam. Bilateral pleural effusions. Electronically Signed   By: Marcello Moores  Register   On: 09/14/2017 06:13   Portable Chest Xray  Result Date: 09/11/2017 CLINICAL DATA:  Acute respiratory failure.  Ventilator support. EXAM: PORTABLE CHEST 1 VIEW COMPARISON:  09/10/2017.  09/09/2017. FINDINGS: Endotracheal tube is 1 cm above the carina. Nasogastric tube enters the abdomen. Right arm PICC tip is in the right atrium. Surgical drain tubes overlie the chest and abdomen. Persistent atelectasis/infiltrate in both lower lobes. IMPRESSION: Lines and tubes well positioned. Mild lower lobe atelectasis/infiltrate following thoracic and abdominal surgery. Electronically Signed   By: Nelson Chimes M.D.   On: 09/11/2017 06:57   Dg Chest Portable 1 View  Result Date: 09/10/2017 CLINICAL DATA:  Central line placement. EXAM: PORTABLE CHEST 1 VIEW COMPARISON:  CT chest 09/09/2017.  One-view chest x-ray 09/09/2017. FINDINGS: Heart is enlarged. Large hiatal hernia is again noted. The patient has been intubated. The endotracheal tube terminates less than 1 cm from the carina and could be pulled back to 3 cm for more optimal positioning. A left IJ line terminates above the aorta. Location is indeterminate. There is no pneumothorax. An NG tube is in place with decompression of the stomach and large hiatal hernia. Bilateral pleural effusions and basilar airspace disease are again noted. Pulmonary vascular congestion has increased. IMPRESSION: 1. Vascular  catheter within the left-sided neck terminates above the aorta. Position is indeterminate. 2. Endotracheal tube terminates less than 1 cm from the carina. 3. Interval decompression of stomach enlarged hiatal hernia following placement of NG tube. 4. Bilateral pleural effusions and associated airspace disease, likely atelectasis. These results were called by telephone at the time of interpretation on 09/10/2017 at 12:58 Pm to Dr. Romana Juniper , who verbally acknowledged these results. Electronically Signed   By: San Morelle M.D.   On: 09/10/2017 13:04   Dg Chest Port 1 View  Result Date: 09/09/2017 CLINICAL DATA:  Follow-up shortness of breath history of a hiatal hernia. EXAM: PORTABLE CHEST 1 VIEW COMPARISON:  Chest x-ray of June 12, 2017 FINDINGS: There is marked dilation of a large diaphragmatic hernia containing gas-filled stomach and possibly bowel. A tubular structure overlies this region which could reflect a G-tube in the appropriate setting. There is bibasilar atelectasis adjacent to the hiatal hernia- intrathoracic stomach. There is no pleural effusion. The cardiac silhouette is enlarged. The pulmonary vascularity is not engorged. IMPRESSION: Subsegmental atelectasis adjacent to the large diaphragmatic hernia. There is gaseous distention of the stomach. There may be bowel loops above the hemidiaphragm as well. No discrete pneumonia. No CHF. Electronically Signed   By: David  Martinique M.D.   On: 09/09/2017 08:45   Dg Abd Portable 1v  Result Date: 09/14/2017 CLINICAL DATA:  Ileus EXAM: PORTABLE ABDOMEN - 1 VIEW COMPARISON:  09/09/2017 FINDINGS: NG tube tip projects over the mid stomach. Nonobstructive bowel gas pattern. No free air organomegaly. IMPRESSION: No evidence of bowel obstruction  or ileus. Electronically Signed   By: Rolm Baptise M.D.   On: 09/14/2017 09:32   Dg Abd Portable 1v  Result Date: 09/09/2017 CLINICAL DATA:  Abdominal pain.  Hiatal hernia repair. EXAM: PORTABLE ABDOMEN -  1 VIEW COMPARISON:  07/28/2017. FINDINGS: Nonobstructive gas pattern. Gastrostomy tube LEFT upper quadrant. No worrisome osseous findings. IMPRESSION: As above. Electronically Signed   By: Staci Righter M.D.   On: 09/09/2017 18:37   Dg Esophagus W/water Sol Cm  Result Date: 09/09/2017 CLINICAL DATA:  Hiatal hernia. Recent gastrostomy tube placement. "Rule out leak." EXAM: ESOPHOGRAM/BARIUM SWALLOW TECHNIQUE: Single contrast examination was performed using water-soluble contrast. FLUOROSCOPY TIME:  Fluoroscopy Time:  1.0 minutes Radiation Exposure Index (if provided by the fluoroscopic device): 10.4 Number of Acquired Spot Images: 0 COMPARISON:  06/10/2017 and chest CT from 05/08/2017 FINDINGS: The patient tolerated taking 3 small sips of water soluble contrast, a total of about 20 cc. After that she refused all further imaging. She would only allow imaging in the supine position hunched forward about 40 degrees by wedge pillows. On the swallows, the esophagus is noted to deviate laterally to the right before emptying into the very large intrathoracic hiatal hernia at projects over the central chest partially due to the patient positioning. There is some degree of esophageal dysmotility, as well as distal esophageal irregularity. Contrast surrounds the distal esophagus but is probably still within the very large hiatal hernia, which is distended with gas. I tilted the table up about 6 or 7 degrees, which is the most that the patient could tolerate without starting to slide. I was not able to get contrast to empty down into the intra- abdominal portion of the stomach. I am able to see a filling defect in the gastric bubble intra-abdominal portion compatible with the G-tube retention mechanism. IMPRESSION: 1. Today's exam does not exclude leak from the esophagus or stomach. The exam is simply too limited and the patient is not a suitable candidate for fluoroscopic imaging of this type. Esophagram requires the  patient's ability to turn a different angles and swallow more than a sip or two of contrast, and this patient's required positioning today, sitting up about 40 degrees with respect to the plane of imaging, is particularly unsuitable to fluoroscopic diagnosis. 2. From what I can tell on the limited assessment achievable, the esophagus is bunched over to the right and empties into a very large hiatal hernia containing at least most of the stomach. The contrast fills along the posterior wall of the hiatal hernia. The esophagus is irregular distally, and ulceration is not excluded. Chest CT might be an alternative although I am not certain that the patient will tolerate laying down for chest CT. Electronically Signed   By: Van Clines M.D.   On: 09/09/2017 11:25    Labs:  CBC:  Recent Labs  09/14/17 0347 09/15/17 0400 09/16/17 0545 09/17/17 0500  WBC 15.2* 16.6* 20.8* 22.3*  HGB 10.4* 10.9* 10.5* 10.2*  HCT 31.4* 32.7* 31.2* 30.5*  PLT 83* 69* 51* 36*    COAGS:  Recent Labs  05/06/17 2133 05/07/17 0244 09/17/17 1021  INR 1.18  --  1.24  APTT  --  21*  --     BMP:  Recent Labs  09/14/17 0347 09/15/17 0400 09/16/17 0545 09/17/17 0500  NA 150* 146* 145 144  K 3.2* 2.9* 2.9* 3.5  CL 124* 116* 108 102  CO2 17* 20* 24 28  GLUCOSE 114* 164* 123* 112*  BUN  11 12 18  25*  CALCIUM 7.3* 7.7* 7.5* 7.6*  CREATININE 1.59* 1.70* 1.78* 1.94*  GFRNONAA 32* 30* 28* 25*  GFRAA 37* 34* 32* 29*    LIVER FUNCTION TESTS:  Recent Labs  07/28/17 0500 09/10/17 0851 09/11/17 0509 09/13/17 0500  BILITOT 0.4 0.6 0.7 1.2  AST 21 48* 45* 19  ALT 15 21 29 14   ALKPHOS 102 91 93 75  PROT 7.0 5.9* 4.4* 5.7*  ALBUMIN 2.8* 2.4* 1.8* 3.2*    TUMOR MARKERS: No results for input(s): AFPTM, CEA, CA199, CHROMGRNA in the last 8760 hours.  Assessment and Plan: 71 y.o. female with history of Crohn's disease, psoriasis and tight 4 paraesophageal hiatal hernia. On 09/08/17 she underwent  diagnostic laparoscopy, G-tube placement, gastropexy, lysis of adhesions during hiatal hernia repair. She subsequently developed septic shock, VDRF, acute on CKD, pneumoperitoneum with purulent peritonitis. On 09/10/17 she underwent exploratory lap, takedown of gastrostomy and repair of gastrotomy, washout and placement of mediastinal drains, hepatorrhaphy x2. She has since been extubated but now has worsening leukocytosis (now 22.3) as well as thrombocytopenia. CT of the abdomen and pelvis performed yesterday revealed complex postop fluid collection in the large hiatal hernia possibly representing ascites versus edematous mesentery versus hemorrhage. Also present was 8.7 cm cul-de-sac collection, possibly abscess as well as changes of colitis and increase in bilateral pleural effusions. Request now received from surgery for image guided drainage of the pelvic fluid collection.Imaging studies have been reviewed by Dr. Laurence Ferrari and Dr. Kathlene Cote. Area appears to be amenable to percutaneous drainage via transgluteal approach. Platelets currently 36,000-will need transfusion prior to procedure.Risks and benefits discussed with the patient/sister including bleeding, infection, damage to adjacent structures, bowel perforation/fistula connection, and sepsis.All of the patient's questions were answered, patient is agreeable to proceed.Consent signed and in chart.Procedure tentatively scheduled for later this afternoon. BP remains on soft side.     Thank you for this interesting consult.  I greatly enjoyed meeting Danielle [name] and look forward to participating in their care.  A copy of this report was sent to the requesting provider on this date.  Electronically Signed: D. Rowe Robert, PA-C 09/17/2017, 12:51 PM   I spent a total of 40 minutes  in face to face in clinical consultation, greater than 50% of which was counseling/coordinating care for CT-guided drainage of pelvic fluid collection

## 2017-09-17 NOTE — Progress Notes (Addendum)
7 Days Post-Op   Subjective/Chief Complaint: Pt doing well today. Tol TF Getting out of bed to chair CT results noted.   Objective: Vital signs in last 24 hours: Temp:  [96.7 F (35.9 C)-98.6 F (37 C)] 98.3 F (36.8 C) (10/12 0800) Pulse Rate:  [89-111] 89 (10/12 0800) Resp:  [14-21] 16 (10/12 0800) BP: (83-108)/(41-63) 88/47 (10/12 0800) SpO2:  [98 %-100 %] 100 % (10/12 0800) Weight:  [60.8 kg (134 lb 0.6 oz)] 60.8 kg (134 lb 0.6 oz) (10/12 0615) Last BM Date: 09/17/17  Intake/Output from previous day: 10/11 0701 - 10/12 0700 In: 1152.5 [P.O.:120; I.V.:240; NG/GT:512.5; IV Piggyback:280] Out: 4800 [Urine:4175; Drains:25; Stool:600] Intake/Output this shift: Total I/O In: 100 [I.V.:20; NG/GT:80] Out: 375 [Urine:375]  General appearance: alert and cooperative Cardio: regular rate and rhythm, S1, S2 normal, no murmur, click, rub or gallop GI: soft, approp ttp, ND, incision c/d/i, Jp SS  Lab Results:   Recent Labs  09/16/17 0545 09/17/17 0500  WBC 20.8* 22.3*  HGB 10.5* 10.2*  HCT 31.2* 30.5*  PLT 51* 36*   BMET  Recent Labs  09/16/17 0545 09/17/17 0500  NA 145 144  K 2.9* 3.5  CL 108 102  CO2 24 28  GLUCOSE 123* 112*  BUN 18 25*  CREATININE 1.78* 1.94*  CALCIUM 7.5* 7.6*   PT/INR No results for input(s): LABPROT, INR in the last 72 hours. ABG No results for input(s): PHART, HCO3 in the last 72 hours.  Invalid input(s): PCO2, PO2  Studies/Results: Ct Abdomen Pelvis W Contrast  Result Date: 09/16/2017 CLINICAL DATA:  admitted for surgical intervention of hiatal hernia. She had laparoscopic gastropexy, lysis of adhesions, and G tube placement. Developed septic shock and VDRF post op from pneumoperitoneum with purulent peritonitis with concern for G tube leak.HX crohns, hiatal hernia, bowel resection, appendectomy, cholecystectomy EXAM: CT ABDOMEN AND PELVIS WITH CONTRAST TECHNIQUE: Multidetector CT imaging of the abdomen and pelvis was performed  using the standard protocol following bolus administration of intravenous contrast. CONTRAST:  31mL ISOVUE-300 IOPAMIDOL (ISOVUE-300) INJECTION 61% COMPARISON:  09/09/2017 FINDINGS: Lower chest: Moderate bilateral pleural effusions. Extensive atelectasis/ consolidation in the dependent aspect of both lower lobes. Right subclavian central line to the cavoatrial junction. Large hiatal hernia containing gastric fundus and a large amount of mesenteric fat, with surrounding fluid which may be ascites or hemorrhage. Nasogastric tube in place. Two surgical drains from the abdomen extend into the hiatal hernia region. Hepatobiliary: Liver unremarkable. Cholecystectomy clips. 4.4 x2.5 cm fluid collection at inferior aspect of the falciform ligament, without surrounding enhancement. No intraparenchymal liver lesion. No biliary ductal dilatation. Pancreas: Unremarkable. No pancreatic ductal dilatation or surrounding inflammatory changes. Spleen: Normal in size without focal abnormality. Adrenals/Urinary Tract: Normal adrenals. Kidneys unremarkable. Foley catheter decompresses the urinary bladder. Stomach/Bowel: Interval removal of gastrostomy tube. Stomach is decompressed, the fundus extending into the hiatal hernia as above. Proximal small bowel decompressed. There is some distended fluid-filled loops of distal small bowel long-term including terminal ileum showing circumferential mild wall thickening. There is contiguous circumferential wall thickening in the proximal ascending colon, also segmentally in the splenic flexure of the colon. There is moderate inflammatory/edematous change around the splenic flexure without abscess. Colon is nondilated. Rectal tube in place. Vascular/Lymphatic: No significant vascular findings are present. No enlarged abdominal or pelvic lymph nodes. Reproductive: Status post hysterectomy. No adnexal masses. Other: Loculated peripherally enhancing fluid collection in the cul-de-sac measured  approximately 8.7 x 2.1 cm maximum transverse dimensions. There is scattered fluid among  leaves of the mesentery. No free air. Musculoskeletal: Stable mild L1 compression deformity. No acute fracture or worrisome bone lesion. 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 IMPRESSION: 1. Complex postop fluid collection in the large hiatal hernia may represent ascites and edematous mesentery versus hemorrhage. 2. 8.7 cm peripherally enhancing cul-de-sac fluid collection, possible abscess. 3. Circumferential wall thickening in distal ileum, proximal ascending colon, and splenic flexure with surrounding inflammatory/edematous changes consistent with colitis. 4. Increase in bilateral pleural effusions. Electronically Signed   By: Lucrezia Europe M.D.   On: 09/16/2017 19:36    Anti-infectives: Anti-infectives    Start     Dose/Rate Route Frequency Ordered Stop   09/13/17 1200  piperacillin-tazobactam (ZOSYN) IVPB 3.375 g     3.375 g 12.5 mL/hr over 240 Minutes Intravenous Every 8 hours 09/13/17 0732     09/12/17 1500  anidulafungin (ERAXIS) 100 mg in sodium chloride 0.9 % 100 mL IVPB  Status:  Discontinued     100 mg 78 mL/hr over 100 Minutes Intravenous Every 24 hours 09/11/17 1448 09/11/17 1500   09/11/17 1500  anidulafungin (ERAXIS) 200 mg in sodium chloride 0.9 % 200 mL IVPB  Status:  Discontinued     200 mg 78 mL/hr over 200 Minutes Intravenous  Once 09/11/17 1448 09/11/17 1500   09/11/17 1400  anidulafungin (ERAXIS) 100 mg in sodium chloride 0.9 % 100 mL IVPB     100 mg 78 mL/hr over 100 Minutes Intravenous Every 24 hours 09/10/17 1246     09/11/17 0000  piperacillin-tazobactam (ZOSYN) IVPB 2.25 g  Status:  Discontinued     2.25 g 100 mL/hr over 30 Minutes Intravenous Every 6 hours 09/10/17 1410 09/13/17 0732   09/10/17 1430  linezolid (ZYVOX) IVPB 600 mg  Status:  Discontinued     600 mg 300 mL/hr over 60 Minutes Intravenous Every 12 hours 09/10/17 1414 09/16/17 1356   09/10/17 1400  anidulafungin  (ERAXIS) 200 mg in sodium chloride 0.9 % 200 mL IVPB     200 mg 78 mL/hr over 200 Minutes Intravenous  Once 09/10/17 1246 09/10/17 1651   09/10/17 1400  linezolid (ZYVOX) IVPB 600 mg  Status:  Discontinued     600 mg 300 mL/hr over 60 Minutes Intravenous Every 12 hours 09/10/17 1344 09/10/17 1414   09/09/17 1400  vancomycin (VANCOCIN) IVPB 1000 mg/200 mL premix  Status:  Discontinued     1,000 mg 200 mL/hr over 60 Minutes Intravenous Every 24 hours 09/09/17 1346 09/10/17 1344   09/09/17 1345  piperacillin-tazobactam (ZOSYN) IVPB 3.375 g  Status:  Discontinued     3.375 g 12.5 mL/hr over 240 Minutes Intravenous Every 8 hours 09/09/17 1344 09/10/17 1410   09/08/17 1000  vancomycin (VANCOCIN) IVPB 1000 mg/200 mL premix     1,000 mg 200 mL/hr over 60 Minutes Intravenous  Once 09/08/17 0946 09/08/17 1100   09/08/17 0950  ceFAZolin (ANCEF) 2-4 GM/100ML-% IVPB    Comments:  Bridget Hartshorn   : cabinet override      09/08/17 0950 09/08/17 1040   09/08/17 0947  vancomycin (VANCOCIN) 1-5 GM/200ML-% IVPB  Status:  Discontinued    Comments:  Bridget Hartshorn   : cabinet override      09/08/17 0947 09/08/17 0956   09/08/17 0941  vancomycin (VANCOCIN) 1-5 GM/200ML-% IVPB    Comments:  [place] Ron   : cabinet override      09/08/17 0941 09/08/17 1000   09/08/17 0810  ceFAZolin (ANCEF) IVPB 2g/100 mL premix  2 g 200 mL/hr over 30 Minutes Intravenous On call to O.R. 09/08/17 [id number] 09/08/17 1050      Assessment/Plan: s/p Procedure(s): Exploratory laparotomy with takedown of G tube, repair gastrostomy abdominal washout (N/A) Making slow progress. -Will consult IR to see if pelvic fluid can be evacuated. -lytes replaced -cont abx as per ID -cdiff neg -con't to mobilize -Will try PO diet when back from IR  Attempted to call sister to update with no answer and no option to leave VM   LOS: 8 days    [name]., [facility] 09/17/2017

## 2017-09-17 NOTE — Progress Notes (Signed)
PROGRESS NOTE    Danielle [name]  QBH:[id number] DOB: May 03, 1946 DOA: 09/08/2017 PCP: Iona Beard, MD    Brief Narrative:  [phone] who presented for surgical correction of hiatal hernia. Pt developed septic shock post-operatively with purulent peritonitis. Initially followed by CCM. Hospitalist now following for medical management  Assessment & Plan:   Principal Problem:   Respiratory failure, post-operative ([place]) Active Problems:   Hypomagnesemia   Acute renal failure superimposed on stage 3 chronic kidney disease (Danielle [name])   Hiatal hernia   Immunosuppressed status (Danielle [name])   Rectovaginal fistula   Gastrostomy tube in place [facility])   History of repair of hiatal hernia   Peritonitis (HCC)   Fungemia   Encephalopathy acute   Anasarca   Electrolyte imbalance  1. Acute hypoxic resp failure, post-operatively 1. Followed by CCM 2. Currently on minimal O2 support 3. Speaking on full sentences 2. Acute metabolic encephalopathy 1. Appears much improved 2. Pt remains alert and oriented 3. Anasarca 1. Suspect secondary to recent voluem resuscitation 2. Continuing lasix 3. Continues to diurese well 4. Cr higher today. Decrease lasix to once daily 5. Repeat bmet in AM 4. Acute on CKD3 1. Tolerating lasix per above. Decrease to once daily 5. Hypomagnesemia 1. Will replace 2. Recheck level in AM 6. Hypokalemia 1. stable 2. Likely secondary to diuresis 7. Peritonitis 1. Cont per CCM 2. Earlier on linezolid with zosyn. Linezolid stopped secondary to decreasing platelets 3. ID following 8. Fungemia 1. Per ID, continue anidulafungin as tolerated 9. Intra-abdominal fluid collection 1. Per IR, 8.7cm culdesac collection, possibly abscess noted. IR plans for percutaneous drainage via transgluteal approach 2. Plts <50k. Will transfuse one unit plts  DVT prophylaxis: SCD's Code Status: Full Family Communication: Pt in room, family not in room Disposition Plan: Uncertain at this  time  Consultants:     Procedures:     Antimicrobials: Anti-infectives    Start     Dose/Rate Route Frequency Ordered Stop   09/13/17 1200  piperacillin-tazobactam (ZOSYN) IVPB 3.375 g     3.375 g 12.5 mL/hr over 240 Minutes Intravenous Every 8 hours 09/13/17 0732     09/12/17 1500  anidulafungin (ERAXIS) 100 mg in sodium chloride 0.9 % 100 mL IVPB  Status:  Discontinued     100 mg 78 mL/hr over 100 Minutes Intravenous Every 24 hours 09/11/17 1448 09/11/17 1500   09/11/17 1500  anidulafungin (ERAXIS) 200 mg in sodium chloride 0.9 % 200 mL IVPB  Status:  Discontinued     200 mg 78 mL/hr over 200 Minutes Intravenous  Once 09/11/17 1448 09/11/17 1500   09/11/17 1400  anidulafungin (ERAXIS) 100 mg in sodium chloride 0.9 % 100 mL IVPB     100 mg 78 mL/hr over 100 Minutes Intravenous Every 24 hours 09/10/17 1246     09/11/17 0000  piperacillin-tazobactam (ZOSYN) IVPB 2.25 g  Status:  Discontinued     2.25 g 100 mL/hr over 30 Minutes Intravenous Every 6 hours 09/10/17 1410 09/13/17 0732   09/10/17 1430  linezolid (ZYVOX) IVPB 600 mg  Status:  Discontinued     600 mg 300 mL/hr over 60 Minutes Intravenous Every 12 hours 09/10/17 1414 09/16/17 1356   09/10/17 1400  anidulafungin (ERAXIS) 200 mg in sodium chloride 0.9 % 200 mL IVPB     200 mg 78 mL/hr over 200 Minutes Intravenous  Once 09/10/17 1246 09/10/17 1651   09/10/17 1400  linezolid (ZYVOX) IVPB 600 mg  Status:  Discontinued     600  mg 300 mL/hr over 60 Minutes Intravenous Every 12 hours 09/10/17 1344 09/10/17 1414   09/09/17 1400  vancomycin (VANCOCIN) IVPB 1000 mg/200 mL premix  Status:  Discontinued     1,000 mg 200 mL/hr over 60 Minutes Intravenous Every 24 hours 09/09/17 1346 09/10/17 1344   09/09/17 1345  piperacillin-tazobactam (ZOSYN) IVPB 3.375 g  Status:  Discontinued     3.375 g 12.5 mL/hr over 240 Minutes Intravenous Every 8 hours 09/09/17 1344 09/10/17 1410   09/08/17 1000  vancomycin (VANCOCIN) IVPB 1000 mg/200 mL  premix     1,000 mg 200 mL/hr over 60 Minutes Intravenous  Once 09/08/17 0946 09/08/17 1100   09/08/17 0950  ceFAZolin (ANCEF) 2-4 GM/100ML-% IVPB    Comments:  Bridget Hartshorn   : cabinet override      09/08/17 0950 09/08/17 1040   09/08/17 0947  vancomycin (VANCOCIN) 1-5 GM/200ML-% IVPB  Status:  Discontinued    Comments:  Bridget Hartshorn   : cabinet override      09/08/17 0947 09/08/17 0956   09/08/17 0941  vancomycin (VANCOCIN) 1-5 GM/200ML-% IVPB    Comments:  [place] Ron   : cabinet override      09/08/17 0941 09/08/17 1000   09/08/17 0810  ceFAZolin (ANCEF) IVPB 2g/100 mL premix     2 g 200 mL/hr over 30 Minutes Intravenous On call to O.R. 09/08/17 0811 09/08/17 1050      Subjective: Without complaints at this time  Objective: Vitals:   09/17/17 1505 09/17/17 1513 09/17/17 1530 09/17/17 1545  BP: (!) 105/54 (!) 98/56 (!) 98/56 (!) 98/56  Pulse: (!) 110 (!) 111 (!) 112 (!) 114  Resp: (!) 21  (!) 22 (!) 27  Temp: 99 F (37.2 C) (!) 100.5 F (38.1 C) 100.3 F (37.9 C) 100 F (37.8 C)  TempSrc: Oral Oral Oral Oral  SpO2: 98% 97% 96% 100%  Weight:      Height:        Intake/Output Summary (Last 24 hours) at 09/17/17 1727 Last data filed at 09/17/17 1549  Gross per 24 hour  Intake          2472.87 ml  Output             4450 ml  Net         -1977.13 ml   Filed Weights   09/15/17 0413 09/16/17 0600 09/17/17 0615  Weight: 70.7 kg (155 lb 13.8 oz) 62.9 kg (138 lb 10.7 oz) 60.8 kg (134 lb 0.6 oz)    Examination: General exam: Awake, laying in bed, in nad Respiratory system: Normal respiratory effort, no wheezing Cardiovascular system: regular rate, s1, s2 Gastrointestinal system: Soft, nondistended, positive BS Central nervous system: CN2-12 grossly intact, strength intact Extremities: Perfused, no clubbing Skin: Normal skin turgor, no notable skin lesions seen Psychiatry: Mood normal // no visual hallucinations   Data Reviewed: I have personally reviewed  following labs and imaging studies  CBC:  Recent Labs Lab 09/11/17 0509  09/13/17 0500 09/14/17 0347 09/15/17 0400 09/16/17 0545 09/17/17 0500  WBC 23.4*  < > 16.0* 15.2* 16.6* 20.8* 22.3*  NEUTROABS 19.7*  --   --  11.4* 11.3* 14.1* 15.7*  HGB 7.7*  < > 10.1* 10.4* 10.9* 10.5* 10.2*  HCT 23.0*  < > 29.8* 31.4* 32.7* 31.2* 30.5*  MCV 87.5  < > 86.1 85.8 85.8 86.0 86.6  PLT 140*  < > 100* 83* 69* 51* 36*  < > = values in this interval  not displayed. Basic Metabolic Panel:  Recent Labs Lab 09/13/17 0500 09/14/17 0347 09/15/17 0400 09/16/17 0545 09/17/17 0500  NA 149* 150* 146* 145 144  K 3.3* 3.2* 2.9* 2.9* 3.5  CL 121* 124* 116* 108 102  CO2 19* 17* 20* 24 28  GLUCOSE 104* 114* 164* 123* 112*  BUN _0 25*  CREATININE 1.76* 1.59* 1.70* 1.78* 1.94*  CALCIUM 7.5* 7.3* 7.7* 7.5* 7.6*  MG 1.8 1.8 1.5* 1.8 1.6*  PHOS 4.0 3.5 2.8 3.3 4.7*   GFR: Estimated Creatinine Clearance: 24.2 mL/min (A) (by C-G formula based on SCr of 1.94 mg/dL (H)). Liver Function Tests:  Recent Labs Lab 09/11/17 0509 09/13/17 0500  AST 45* 19  ALT 29 14  ALKPHOS 93 75  BILITOT 0.7 1.2  PROT 4.4* 5.7*  ALBUMIN 1.8* 3.2*   No results for input(s): LIPASE, AMYLASE in the last 168 hours. No results for input(s): AMMONIA in the last 168 hours. Coagulation Profile:  Recent Labs Lab 09/17/17 1021  INR 1.24   Cardiac Enzymes: No results for input(s): CKTOTAL, CKMB, CKMBINDEX, TROPONINI in the last 168 hours. BNP (last 3 results) No results for input(s): PROBNP in the last 8760 hours. HbA1C: No results for input(s): HGBA1C in the last 72 hours. CBG:  Recent Labs Lab 09/16/17 2007 09/16/17 2338 09/17/17 0325 09/17/17 0738 09/17/17 1219  GLUCAP 65 103* 96 114* 70   Lipid Profile: No results for input(s): CHOL, HDL, LDLCALC, TRIG, CHOLHDL, LDLDIRECT in the last 72 hours. Thyroid Function Tests: No results for input(s): TSH, T4TOTAL, FREET4, T3FREE, THYROIDAB in the last  72 hours. Anemia Panel: No results for input(s): VITAMINB12, FOLATE, FERRITIN, TIBC, IRON, RETICCTPCT in the last 72 hours. Sepsis Labs:  Recent Labs Lab 09/11/17 0509  PROCALCITON 52.02    Recent Results (from the past 240 hour(s))  Culture, blood (Routine X 2) w Reflex to ID Panel     Status: Abnormal   Collection Time: 09/09/17  2:09 PM  Result Value Ref Range Status   Specimen Description BLOOD RIGHT ANTECUBITAL  Final   Special Requests IN PEDIATRIC BOTTLE Blood Culture adequate volume  Final   Culture  Setup Time   Final    BUDDING YEAST SEEN IN PEDIATRIC BOTTLE CRITICAL RESULT CALLED TO, READ BACK BY AND VERIFIED WITH: N GLOGOVAC,PHARMD AT 1652 09/11/17 BY L BENFIELD Performed at [facility], [place] [address] [place], [place] [address]    Culture CANDIDA GLABRATA (A)  Final   Report Status 09/13/2017 FINAL  Final  Blood Culture ID Panel (Reflexed)     Status: Abnormal   Collection Time: 09/09/17  2:09 PM  Result Value Ref Range Status   Enterococcus species NOT DETECTED NOT DETECTED Final   Listeria monocytogenes NOT DETECTED NOT DETECTED Final   Staphylococcus species NOT DETECTED NOT DETECTED Final   Staphylococcus aureus NOT DETECTED NOT DETECTED Final   Streptococcus species NOT DETECTED NOT DETECTED Final   Streptococcus agalactiae NOT DETECTED NOT DETECTED Final   Streptococcus pneumoniae NOT DETECTED NOT DETECTED Final   Streptococcus pyogenes NOT DETECTED NOT DETECTED Final   Acinetobacter baumannii NOT DETECTED NOT DETECTED Final   Enterobacteriaceae species NOT DETECTED NOT DETECTED Final   Enterobacter cloacae complex NOT DETECTED NOT DETECTED Final   Escherichia coli NOT DETECTED NOT DETECTED Final   Klebsiella oxytoca NOT DETECTED NOT DETECTED Final   Klebsiella pneumoniae NOT DETECTED NOT DETECTED Final   Proteus species NOT DETECTED NOT DETECTED Final   Serratia  marcescens NOT DETECTED NOT DETECTED Final   Haemophilus influenzae NOT DETECTED NOT  DETECTED Final   Neisseria meningitidis NOT DETECTED NOT DETECTED Final   Pseudomonas aeruginosa NOT DETECTED NOT DETECTED Final   Candida albicans NOT DETECTED NOT DETECTED Final   Candida glabrata DETECTED (A) NOT DETECTED Final    Comment: CRITICAL RESULT CALLED TO, READ BACK BY AND VERIFIED WITH: N GLOGOVAC,PHARMD AT 1652 09/11/17 BY L BENFIELD    Candida krusei NOT DETECTED NOT DETECTED Final   Candida parapsilosis NOT DETECTED NOT DETECTED Final   Candida tropicalis NOT DETECTED NOT DETECTED Final    Comment: Performed at [facility], [place] [address], [place], [place] [address]  Culture, blood (Routine X 2) w Reflex to ID Panel     Status: Abnormal   Collection Time: 09/09/17  2:34 PM  Result Value Ref Range Status   Specimen Description BLOOD RIGHT HAND  Final   Special Requests IN PEDIATRIC BOTTLE Blood Culture adequate volume  Final   Culture  Setup Time   Final    YEAST IN PEDIATRIC BOTTLE CRITICAL RESULT CALLED TO, READ BACK BY AND VERIFIED WITH: J. GADHIA,PHARMD 1946 09/11/2017 T. TYSOR    Culture CANDIDA GLABRATA (A)  Final   Report Status 09/14/2017 FINAL  Final  Culture, Urine     Status: None   Collection Time: 09/10/17  1:49 PM  Result Value Ref Range Status   Specimen Description URINE, RANDOM  Final   Special Requests NONE  Final   Culture   Final    NO GROWTH Performed at [facility], [place] [address] [place], [place] [address]    Report Status 09/11/2017 FINAL  Final  Culture, blood (routine x 2)     Status: None (Preliminary result)   Collection Time: 09/15/17  8:23 AM  Result Value Ref Range Status   Specimen Description BLOOD LEFT HAND  Final   Special Requests IN PEDIATRIC BOTTLE Blood Culture adequate volume  Final   Culture   Final    NO GROWTH 2 DAYS Performed at [facility], [place] [address], [place], [place] [address]    Report Status PENDING  Incomplete  Culture, blood (routine x 2)     Status: None (Preliminary result)    Collection Time: 09/15/17  8:23 AM  Result Value Ref Range Status   Specimen Description BLOOD LEFT HAND  Final   Special Requests IN PEDIATRIC BOTTLE Blood Culture adequate volume  Final   Culture   Final    NO GROWTH 2 DAYS Performed at [facility], [place] [address], [place], [place] [address]    Report Status PENDING  Incomplete  C difficile quick scan w PCR reflex     Status: None   Collection Time: 09/16/17  2:15 PM  Result Value Ref Range Status   C Diff antigen NEGATIVE NEGATIVE Final   C Diff toxin NEGATIVE NEGATIVE Final   C Diff interpretation No C. difficile detected.  Final     Radiology Studies: Ct Abdomen Pelvis W Contrast  Result Date: 09/16/2017 CLINICAL DATA:  admitted for surgical intervention of hiatal hernia. She had laparoscopic gastropexy, lysis of adhesions, and G tube placement. Developed septic shock and VDRF post op from pneumoperitoneum with purulent peritonitis with concern for G tube leak.HX crohns, hiatal hernia, bowel resection, appendectomy, cholecystectomy EXAM: CT ABDOMEN AND PELVIS WITH CONTRAST TECHNIQUE: Multidetector CT imaging of the abdomen and pelvis was performed using the standard protocol following  bolus administration of intravenous contrast. CONTRAST:  1m ISOVUE-300 IOPAMIDOL (ISOVUE-300) INJECTION 61% COMPARISON:  09/09/2017 FINDINGS: Lower chest: Moderate bilateral pleural effusions. Extensive atelectasis/ consolidation in the dependent aspect of both lower lobes. Right subclavian central line to the cavoatrial junction. Large hiatal hernia containing gastric fundus and a large amount of mesenteric fat, with surrounding fluid which may be ascites or hemorrhage. Nasogastric tube in place. Two surgical drains from the abdomen extend into the hiatal hernia region. Hepatobiliary: Liver unremarkable. Cholecystectomy clips. 4.4 x2.5 cm fluid collection at inferior aspect of the falciform ligament, without surrounding enhancement. No  intraparenchymal liver lesion. No biliary ductal dilatation. Pancreas: Unremarkable. No pancreatic ductal dilatation or surrounding inflammatory changes. Spleen: Normal in size without focal abnormality. Adrenals/Urinary Tract: Normal adrenals. Kidneys unremarkable. Foley catheter decompresses the urinary bladder. Stomach/Bowel: Interval removal of gastrostomy tube. Stomach is decompressed, the fundus extending into the hiatal hernia as above. Proximal small bowel decompressed. There is some distended fluid-filled loops of distal small bowel long-term including terminal ileum showing circumferential mild wall thickening. There is contiguous circumferential wall thickening in the proximal ascending colon, also segmentally in the splenic flexure of the colon. There is moderate inflammatory/edematous change around the splenic flexure without abscess. Colon is nondilated. Rectal tube in place. Vascular/Lymphatic: No significant vascular findings are present. No enlarged abdominal or pelvic lymph nodes. Reproductive: Status post hysterectomy. No adnexal masses. Other: Loculated peripherally enhancing fluid collection in the cul-de-sac measured approximately 8.7 x 2.1 cm maximum transverse dimensions. There is scattered fluid among leaves of the mesentery. No free air. Musculoskeletal: Stable mild L1 compression deformity. No acute fracture or worrisome bone lesion. 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 IMPRESSION: 1. Complex postop fluid collection in the large hiatal hernia may represent ascites and edematous mesentery versus hemorrhage. 2. 8.7 cm peripherally enhancing cul-de-sac fluid collection, possible abscess. 3. Circumferential wall thickening in distal ileum, proximal ascending colon, and splenic flexure with surrounding inflammatory/edematous changes consistent with colitis. 4. Increase in bilateral pleural effusions. Electronically Signed   By: DLucrezia EuropeM.D.   On: 09/16/2017 19:36    Scheduled  Meds: . chlorhexidine gluconate (MEDLINE KIT)  15 mL Mouth Rinse BID  . Chlorhexidine Gluconate Cloth  6 each Topical Q0600  . fentaNYL      . furosemide  40 mg Intravenous Daily  . insulin aspart  0-9 Units Subcutaneous Q4H  . mouth rinse  15 mL Mouth Rinse QID  . midazolam      . mupirocin ointment  1 application Nasal BID  . pantoprazole (PROTONIX) IV  40 mg Intravenous Q24H  . potassium chloride  40 mEq Oral BID  . sodium chloride flush  10-40 mL Intracatheter Q12H   Continuous Infusions: . anidulafungin Stopped (09/17/17 1558)  . dextrose 5 % and 0.45% NaCl 10 mL/hr at 09/16/17 0539  . feeding supplement (VITAL 1.5 CAL) Stopped (09/17/17 0900)  . piperacillin-tazobactam (ZOSYN)  IV 3.375 g (09/17/17 1343)     LOS: 8 days   Lashanti Chambless, SOrpah Melter MD [facility] Pager [phone] If 7PM-7AM, please contact night-coverage [email or web address] Password [email or web address] 09/17/2017, 5:27 PM

## 2017-09-17 NOTE — Evaluation (Signed)
Occupational Therapy Evaluation Patient Details Name: Danielle [name] MRN: [id number] DOB: March 22, 1946 Today's Date: 09/17/2017    History of Present Illness 71 yo female s/p Gtube placement, gastropexy, hernia repair 09/08/17; emergent exp lap, hepatorrhapy, mediastinal drains, gastrotomy 10/5; peritonitis, septic shock, VDRF-extubated 10/6, hypotension, hypokalemia. Hx of hiatal hernia, Crohn's disease.    Clinical Impression   Pt was admitted for the above.  At baseline, she is independent with adls. She lives with her sister and plans to return there at d/c, although she would benefit from snf.  Pt needs max to total A (+2 for LB) at this time. Her arms are weak.  She will benefit from continued OT in acute as well as follow up OT when she is discharged. Goals in acute are for min A overall    Follow Up Recommendations  SNF (pt is refusing; if home 24/7 and HHOT)    Equipment Recommendations  None recommended by OT    Recommendations for Other Services       Precautions / Restrictions Precautions Precautions: Fall Precaution Comments: R and L abdominal jp drains, flexiseal, G tube Restrictions Weight Bearing Restrictions: No      Mobility Bed Mobility     Rolling: Mod assist;Min assist         General bed mobility comments: partially rolled to L to remove pillow  Transfers                 General transfer comment: did not perform today    Balance               Standing balance comment: not assessed this session:  poor with PT on their last visit                           ADL either performed or assessed with clinical judgement   ADL Overall ADL's : Needs assistance/impaired Eating/Feeding: Maximal assistance;Bed level   Grooming: Maximal assistance;Bed level   Upper Body Bathing: Maximal assistance;Bed level   Lower Body Bathing: Total assistance;+2 for physical assistance;Bed level   Upper Body Dressing : Maximal assistance;Bed  level   Lower Body Dressing: Total assistance;+2 for physical assistance;Bed level                 General ADL Comments: pt seen only at bed level today. She is only allowed ice chips at this time and she has a NG tube. Both weakness and this tube impair self-feeding.  Proximal support for adls.  She is interested in seeing AE for adls (when she is stronger)     Vision         Perception     Praxis      Pertinent Vitals/Pain Pain Assessment: 0-10 Pain Score: 8  Pain Location: back, abdomen Pain Descriptors / Indicators: Aching;Sore Pain Intervention(s): Limited activity within patient's tolerance;Monitored during session;Premedicated before session;Repositioned     Hand Dominance Left   Extremity/Trunk Assessment Upper Extremity Assessment Upper Extremity Assessment:  (3-/5 L shoulder, 2+/5 R; grips 3+/5)           Communication     Cognition Arousal/Alertness: Awake/alert Behavior During Therapy: WFL for tasks assessed/performed Overall Cognitive Status: Within Functional Limits for tasks assessed                                     General Comments  Exercises Exercises: Other exercises Other Exercises Other Exercises: performed 2 sets of 5 AAROM to shoulders to 90 degrees. Encouraged pt to move LUE herself over the weekend. If she moves R, she needs to have nursing make sure that lines are free   Shoulder Instructions      Home Living Family/patient expects to be discharged to:: Private residence Living Arrangements: Other relatives (sister) Available Help at Discharge: Family;Available PRN/intermittently               Bathroom Shower/Tub: Teacher, early years/pre: Standard     Home Equipment: Shower seat;Bedside commode;Cane - single point          Prior Functioning/Environment Level of Independence: Independent with assistive device(s)        Comments: Pt used SPC when ambulating in community         OT Problem List: Decreased strength;Decreased activity tolerance;Impaired balance (sitting and/or standing);Decreased knowledge of use of DME or AE;Pain      OT Treatment/Interventions: Self-care/ADL training;DME and/or AE instruction;Balance training;Patient/family education;Therapeutic activities;Therapeutic exercise    OT Goals(Current goals can be found in the care plan section) Acute Rehab OT Goals Patient Stated Goal: to get up. less pain. OT Goal Formulation: With patient Time For Goal Achievement: 10/01/17 Potential to Achieve Goals: Good ADL Goals Pt Will Perform Eating: with min assist;sitting;bed level Pt Will Perform Grooming: with min assist;sitting;bed level Pt Will Perform Lower Body Bathing: with min assist;with adaptive equipment;sit to/from stand Pt Will Perform Lower Body Dressing: with min assist;with adaptive equipment;sit to/from stand Pt Will Transfer to Toilet: with min assist;ambulating;bedside commode Pt Will Perform Toileting - Clothing Manipulation and hygiene: with min assist;sit to/from stand Additional ADL Goal #1: pt will perform 3 sets of 10 AAROM to bil shoulders to increase strength for adls  OT Frequency: Min 2X/week   Barriers to D/C:            Co-evaluation              AM-PAC PT "6 Clicks" Daily Activity     Outcome Measure Help from another person eating meals?: A Lot Help from another person taking care of personal grooming?: A Lot Help from another person toileting, which includes using toliet, bedpan, or urinal?: A Lot Help from another person bathing (including washing, rinsing, drying)?: A Lot Help from another person to put on and taking off regular upper body clothing?: A Lot Help from another person to put on and taking off regular lower body clothing?: Total 6 Click Score: 11   End of Session    Activity Tolerance: Patient limited by pain Patient left: in bed;with call bell/phone within reach  OT Visit Diagnosis:  Muscle weakness (generalized) (M62.81)                Time: 3893-7342 OT Time Calculation (min): 17 min Charges:  OT General Charges $OT Visit: 1 Visit OT Evaluation $OT Eval Low Complexity: 1 Low G-Codes:     [place], OTR/L [phone] 09/17/2017  Brieanna Nau 09/17/2017, 3:19 PM

## 2017-09-18 LAB — PREPARE PLATELET PHERESIS
UNIT DIVISION: 0
Unit division: 0

## 2017-09-18 LAB — GLUCOSE, CAPILLARY
GLUCOSE-CAPILLARY: 85 mg/dL (ref 65–99)
GLUCOSE-CAPILLARY: 107 mg/dL — AB (ref 65–99)
GLUCOSE-CAPILLARY: 55 mg/dL — AB (ref 65–99)
Glucose-Capillary: 131 mg/dL — ABNORMAL HIGH (ref 65–99)
Glucose-Capillary: 83 mg/dL (ref 65–99)
Glucose-Capillary: 90 mg/dL (ref 65–99)
Glucose-Capillary: 92 mg/dL (ref 65–99)
Glucose-Capillary: 98 mg/dL (ref 65–99)

## 2017-09-18 LAB — CBC WITH DIFFERENTIAL/PLATELET
BASOS ABS: 0 10*3/uL (ref 0.0–0.1)
Basophils Relative: 0 %
Eosinophils Absolute: 0.2 10*3/uL (ref 0.0–0.7)
Eosinophils Relative: 1 %
HEMATOCRIT: 30.7 % — AB (ref 36.0–46.0)
HEMOGLOBIN: 10.1 g/dL — AB (ref 12.0–15.0)
LYMPHS PCT: 28 %
Lymphs Abs: 6.5 10*3/uL — ABNORMAL HIGH (ref 0.7–4.0)
MCH: 28.1 pg (ref 26.0–34.0)
MCHC: 32.9 g/dL (ref 30.0–36.0)
MCV: 85.5 fL (ref 78.0–100.0)
MONOS PCT: 10 %
Monocytes Absolute: 2.3 10*3/uL — ABNORMAL HIGH (ref 0.1–1.0)
NEUTROS ABS: 14.3 10*3/uL — AB (ref 1.7–7.7)
Neutrophils Relative %: 61 %
Platelets: 121 10*3/uL — ABNORMAL LOW (ref 150–400)
RBC: 3.59 MIL/uL — AB (ref 3.87–5.11)
RDW: 15.6 % — ABNORMAL HIGH (ref 11.5–15.5)
WBC: 23.3 10*3/uL — AB (ref 4.0–10.5)

## 2017-09-18 LAB — BPAM PLATELET PHERESIS
BLOOD PRODUCT EXPIRATION DATE: 201810131425
BLOOD PRODUCT EXPIRATION DATE: 201810132359
ISSUE DATE / TIME: 201810121522
ISSUE DATE / TIME: 201810121441
UNIT TYPE AND RH: 5100
Unit Type and Rh: 9500

## 2017-09-18 LAB — BASIC METABOLIC PANEL
ANION GAP: 15 (ref 5–15)
BUN: 28 mg/dL — ABNORMAL HIGH (ref 6–20)
CHLORIDE: 104 mmol/L (ref 101–111)
CO2: 26 mmol/L (ref 22–32)
Calcium: 7.4 mg/dL — ABNORMAL LOW (ref 8.9–10.3)
Creatinine, Ser: 2.15 mg/dL — ABNORMAL HIGH (ref 0.44–1.00)
GFR calc non Af Amer: 22 mL/min — ABNORMAL LOW (ref >60)
GFR, EST AFRICAN AMERICAN: 26 mL/min — AB (ref >60)
Glucose, Bld: 94 mg/dL (ref 65–99)
POTASSIUM: 4.3 mmol/L (ref 3.5–5.1)
Sodium: 145 mmol/L (ref 135–145)

## 2017-09-18 MED ORDER — DEXTROSE-NACL 5-0.9 % IV SOLN
INTRAVENOUS | Status: DC
  Administered 2017-09-18 – 2017-09-21 (×2): via INTRAVENOUS
  Administered 2017-09-22: 75 mL via INTRAVENOUS

## 2017-09-18 MED ORDER — DEXTROSE 50 % IV SOLN
INTRAVENOUS | Status: AC
  Filled 2017-09-18: qty 50

## 2017-09-18 MED ORDER — DEXTROSE 50 % IV SOLN
25.0000 mL | Freq: Once | INTRAVENOUS | Status: AC
  Administered 2017-09-18: 25 mL via INTRAVENOUS

## 2017-09-18 NOTE — Progress Notes (Signed)
Patient reported that she accidentally pulled her NG tube out. She refused to have it placed back in, NG tube not replaced at this time, will check with rounding MD for further order.

## 2017-09-18 NOTE — Progress Notes (Signed)
Patient ID: Danielle [name], female   DOB: [birth year], 71 y.o.   MRN: [id number] 8 Days Post-Op   Subjective: Patient pulled NG last night. States she has been nauseated and she said she vomited once last night although nurses are not aware of this and states she just has had some nausea. Denies pain. Mild stable shortness of breath.  Objective: Vital signs in last 24 hours: Temp:  [97.9 F (36.6 C)-100.5 F (38.1 C)] 98.9 F (37.2 C) (10/13 0800) Pulse Rate:  [97-116] 107 (10/13 0600) Resp:  [18-33] 33 (10/13 0600) BP: (97-158)/(37-139) 115/62 (10/13 0600) SpO2:  [93 %-100 %] 97 % (10/13 0600) Weight:  [60.4 kg (133 lb 2.5 oz)] 60.4 kg (133 lb 2.5 oz) (10/13 0435) Last BM Date: 09/17/17 (diarreah- flexi seal in place)  Intake/Output from previous day: 10/12 0701 - 10/13 0700 In: 2690.7 [I.V.:360; Blood:535.7; NG/GT:120; IV Piggyback:100] Out: 1635 [Urine:1225; Drains:110; Stool:300] Intake/Output this shift: No intake/output data recorded.  General appearance: alert, cooperative and no distress Resp: No wheezing. Minimal increased work of breathing. GI: Soft, nontender and nondistended. JP in left upper quadrant and perc drain in left mid abdomen with some serous and cloudy drainage. Incision/Wound: Dressing just changed, clean and dry  Lab Results:   Recent Labs  09/17/17 0500 09/18/17 0547  WBC 22.3* 23.3*  HGB 10.2* 10.1*  HCT 30.5* 30.7*  PLT 36* 121*   BMET  Recent Labs  09/17/17 0500 09/18/17 0547  NA 144 145  K 3.5 4.3  CL 102 104  CO2 28 26  GLUCOSE 112* 94  BUN 25* 28*  CREATININE 1.94* 2.15*  CALCIUM 7.6* 7.4*   Results for orders placed or performed during the hospital encounter of 09/08/17  Culture, blood (Routine X 2) w Reflex to ID Panel     Status: Abnormal   Collection Time: 09/09/17  2:09 PM  Result Value Ref Range Status   Specimen Description BLOOD RIGHT ANTECUBITAL  Final   Special Requests IN PEDIATRIC BOTTLE Blood Culture adequate  volume  Final   Culture  Setup Time   Final    BUDDING YEAST SEEN IN PEDIATRIC BOTTLE CRITICAL RESULT CALLED TO, READ BACK BY AND VERIFIED WITH: N GLOGOVAC,PHARMD AT 1652 09/11/17 BY L BENFIELD Performed at [facility], [place] [address], [place], [place] [address]    Culture CANDIDA GLABRATA (A)  Final   Report Status 09/13/2017 FINAL  Final  Blood Culture ID Panel (Reflexed)     Status: Abnormal   Collection Time: 09/09/17  2:09 PM  Result Value Ref Range Status   Enterococcus species NOT DETECTED NOT DETECTED Final   Listeria monocytogenes NOT DETECTED NOT DETECTED Final   Staphylococcus species NOT DETECTED NOT DETECTED Final   Staphylococcus aureus NOT DETECTED NOT DETECTED Final   Streptococcus species NOT DETECTED NOT DETECTED Final   Streptococcus agalactiae NOT DETECTED NOT DETECTED Final   Streptococcus pneumoniae NOT DETECTED NOT DETECTED Final   Streptococcus pyogenes NOT DETECTED NOT DETECTED Final   Acinetobacter baumannii NOT DETECTED NOT DETECTED Final   Enterobacteriaceae species NOT DETECTED NOT DETECTED Final   Enterobacter cloacae complex NOT DETECTED NOT DETECTED Final   Escherichia coli NOT DETECTED NOT DETECTED Final   Klebsiella oxytoca NOT DETECTED NOT DETECTED Final   Klebsiella pneumoniae NOT DETECTED NOT DETECTED Final   Proteus species NOT DETECTED NOT DETECTED Final   Serratia marcescens NOT DETECTED NOT DETECTED Final   Haemophilus influenzae NOT DETECTED NOT DETECTED Final   Neisseria  meningitidis NOT DETECTED NOT DETECTED Final   Pseudomonas aeruginosa NOT DETECTED NOT DETECTED Final   Candida albicans NOT DETECTED NOT DETECTED Final   Candida glabrata DETECTED (A) NOT DETECTED Final    Comment: CRITICAL RESULT CALLED TO, READ BACK BY AND VERIFIED WITH: N GLOGOVAC,PHARMD AT 1652 09/11/17 BY L BENFIELD    Candida krusei NOT DETECTED NOT DETECTED Final   Candida parapsilosis NOT DETECTED NOT DETECTED Final   Candida tropicalis NOT DETECTED NOT  DETECTED Final    Comment: Performed at [facility], [place] [address] [place], [place] [address]  Culture, blood (Routine X 2) w Reflex to ID Panel     Status: Abnormal   Collection Time: 09/09/17  2:34 PM  Result Value Ref Range Status   Specimen Description BLOOD RIGHT HAND  Final   Special Requests IN PEDIATRIC BOTTLE Blood Culture adequate volume  Final   Culture  Setup Time   Final    YEAST IN PEDIATRIC BOTTLE CRITICAL RESULT CALLED TO, READ BACK BY AND VERIFIED WITH: J. GADHIA,PHARMD 1947 09/11/2017 T. TYSOR    Culture CANDIDA GLABRATA (A)  Final   Report Status 09/14/2017 FINAL  Final  Culture, Urine     Status: None   Collection Time: 09/10/17  1:49 PM  Result Value Ref Range Status   Specimen Description URINE, RANDOM  Final   Special Requests NONE  Final   Culture   Final    NO GROWTH Performed at [facility], [place] [address], [place], [place] [address]    Report Status 09/11/2017 FINAL  Final  Culture, blood (routine x 2)     Status: None (Preliminary result)   Collection Time: 09/15/17  8:23 AM  Result Value Ref Range Status   Specimen Description BLOOD LEFT HAND  Final   Special Requests IN PEDIATRIC BOTTLE Blood Culture adequate volume  Final   Culture   Final    NO GROWTH 2 DAYS Performed at [facility], [place] [address] [place], [place] [address]    Report Status PENDING  Incomplete  Culture, blood (routine x 2)     Status: None (Preliminary result)   Collection Time: 09/15/17  8:23 AM  Result Value Ref Range Status   Specimen Description BLOOD LEFT HAND  Final   Special Requests IN PEDIATRIC BOTTLE Blood Culture adequate volume  Final   Culture   Final    NO GROWTH 2 DAYS Performed at [facility], [place] [address], [place], [place] [address]    Report Status PENDING  Incomplete  C difficile quick scan w PCR reflex     Status: None   Collection Time: 09/16/17  2:15 PM  Result Value Ref Range Status   C Diff antigen NEGATIVE NEGATIVE  Final   C Diff toxin NEGATIVE NEGATIVE Final   C Diff interpretation No C. difficile detected.  Final  Aerobic/Anaerobic Culture (surgical/deep wound)     Status: None (Preliminary result)   Collection Time: 09/17/17  7:30 PM  Result Value Ref Range Status   Specimen Description ABSCESS PELVIC  Final   Special Requests NONE  Final   Gram Stain   Final    ABUNDANT WBC PRESENT, PREDOMINANTLY PMN RARE YEAST Performed at [facility], [place] [address], [place], [place] [address]    Culture PENDING  Incomplete   Report Status PENDING  Incomplete     Studies/Results: Ct Abdomen Pelvis W Contrast  Result Date: 09/16/2017 CLINICAL DATA:  admitted for surgical intervention of hiatal hernia. She had laparoscopic gastropexy, lysis of adhesions, and G tube placement. Developed septic shock and VDRF post op from pneumoperitoneum with purulent peritonitis with concern for G tube leak.HX crohns, hiatal hernia, bowel resection, appendectomy, cholecystectomy EXAM: CT ABDOMEN AND PELVIS WITH CONTRAST TECHNIQUE: Multidetector CT imaging of the abdomen and pelvis was performed using the standard protocol following bolus administration of intravenous contrast. CONTRAST:  73mL ISOVUE-300 IOPAMIDOL (ISOVUE-300) INJECTION 61% COMPARISON:  09/09/2017 FINDINGS: Lower chest: Moderate bilateral pleural effusions. Extensive atelectasis/ consolidation in the dependent aspect of both lower lobes. Right subclavian central line to the cavoatrial junction. Large hiatal hernia containing gastric fundus and a large amount of mesenteric fat, with surrounding fluid which may be ascites or hemorrhage. Nasogastric tube in place. Two surgical drains from the abdomen extend into the hiatal hernia region. Hepatobiliary: Liver unremarkable. Cholecystectomy clips. 4.4 x2.5 cm fluid collection at inferior aspect of the falciform ligament, without surrounding enhancement. No intraparenchymal liver lesion. No biliary ductal dilatation.  Pancreas: Unremarkable. No pancreatic ductal dilatation or surrounding inflammatory changes. Spleen: Normal in size without focal abnormality. Adrenals/Urinary Tract: Normal adrenals. Kidneys unremarkable. Foley catheter decompresses the urinary bladder. Stomach/Bowel: Interval removal of gastrostomy tube. Stomach is decompressed, the fundus extending into the hiatal hernia as above. Proximal small bowel decompressed. There is some distended fluid-filled loops of distal small bowel long-term including terminal ileum showing circumferential mild wall thickening. There is contiguous circumferential wall thickening in the proximal ascending colon, also segmentally in the splenic flexure of the colon. There is moderate inflammatory/edematous change around the splenic flexure without abscess. Colon is nondilated. Rectal tube in place. Vascular/Lymphatic: No significant vascular findings are present. No enlarged abdominal or pelvic lymph nodes. Reproductive: Status post hysterectomy. No adnexal masses. Other: Loculated peripherally enhancing fluid collection in the cul-de-sac measured approximately 8.7 x 2.1 cm maximum transverse dimensions. There is scattered fluid among leaves of the mesentery. No free air. Musculoskeletal: Stable mild L1 compression deformity. No acute fracture or worrisome bone lesion. 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 IMPRESSION: 1. Complex postop fluid collection in the large hiatal hernia may represent ascites and edematous mesentery versus hemorrhage. 2. 8.7 cm peripherally enhancing cul-de-sac fluid collection, possible abscess. 3. Circumferential wall thickening in distal ileum, proximal ascending colon, and splenic flexure with surrounding inflammatory/edematous changes consistent with colitis. 4. Increase in bilateral pleural effusions. Electronically Signed   By: Lucrezia Europe M.D.   On: 09/16/2017 19:36   Ct Image Guided Drainage By Percutaneous Catheter  Result Date:  09/18/2017 CLINICAL DATA:  Status post abdominal surgery with development of peritonitis. Worsening leukocytosis with cul de sac pelvic abscess. EXAM: CT GUIDED CATHETER DRAINAGE OF PELVIC PERITONEAL ABSCESS ANESTHESIA/SEDATION: 3.0 mg IV Versed 100 mcg IV Fentanyl Total Moderate Sedation Time:  11 minutes The patient's level of consciousness and physiologic status were continuously monitored during the procedure by Radiology nursing. PROCEDURE: The procedure, risks, benefits, and alternatives were explained to the patient. Questions regarding the procedure were encouraged and answered. The patient understands and consents to the procedure. A time out was performed prior to initiating the procedure. The right transgluteal region was prepped with chlorhexidine in a sterile fashion, and a sterile drape was applied covering the operative field. A sterile gown and sterile gloves were used for the procedure. Local anesthesia was provided with 1% Lidocaine. The patient was placed in a near decubitus position with the right side up. From a posterior transgluteal approach, an 18 gauge trocar needle  was advanced to the level of a pelvic abscess situated posterior to the bladder and anterior to the rectum. After needle entry, aspiration was performed and a fluid sample sent for culture analysis. A guidewire was advanced into the collection. The tract was dilated and a 10 French percutaneous drainage catheter placed. Catheter position was confirmed by CT. The catheter was flushed and connected to a suction bulb. The catheter was secured at the skin with a Prolene retention suture and StatLock device. COMPLICATIONS: None FINDINGS: Aspiration at the level of the pelvic fluid collection yielded bloody and slightly turbid fluid. A sample was sent for culture analysis. After drain placement, there is excellent return of fluid. IMPRESSION: CT-guided percutaneous catheter drainage of pelvic abscess from a posterior right  transgluteal approach. There was return of bloody and turbid fluid with a sample sent for culture analysis. A 10 French drain was placed and attached to suction bulb drainage. Electronically Signed   By: Aletta Edouard M.D.   On: 09/18/2017 08:39    Anti-infectives: Anti-infectives    Start     Dose/Rate Route Frequency Ordered Stop   09/13/17 1200  piperacillin-tazobactam (ZOSYN) IVPB 3.375 g     3.375 g 12.5 mL/hr over 240 Minutes Intravenous Every 8 hours 09/13/17 0732     09/12/17 1500  anidulafungin (ERAXIS) 100 mg in sodium chloride 0.9 % 100 mL IVPB  Status:  Discontinued     100 mg 78 mL/hr over 100 Minutes Intravenous Every 24 hours 09/11/17 1448 09/11/17 1500   09/11/17 1500  anidulafungin (ERAXIS) 200 mg in sodium chloride 0.9 % 200 mL IVPB  Status:  Discontinued     200 mg 78 mL/hr over 200 Minutes Intravenous  Once 09/11/17 1448 09/11/17 1500   09/11/17 1400  anidulafungin (ERAXIS) 100 mg in sodium chloride 0.9 % 100 mL IVPB     100 mg 78 mL/hr over 100 Minutes Intravenous Every 24 hours 09/10/17 1246     09/11/17 0000  piperacillin-tazobactam (ZOSYN) IVPB 2.25 g  Status:  Discontinued     2.25 g 100 mL/hr over 30 Minutes Intravenous Every 6 hours 09/10/17 1410 09/13/17 0732   09/10/17 1430  linezolid (ZYVOX) IVPB 600 mg  Status:  Discontinued     600 mg 300 mL/hr over 60 Minutes Intravenous Every 12 hours 09/10/17 1414 09/16/17 1356   09/10/17 1400  anidulafungin (ERAXIS) 200 mg in sodium chloride 0.9 % 200 mL IVPB     200 mg 78 mL/hr over 200 Minutes Intravenous  Once 09/10/17 1246 09/10/17 1651   09/10/17 1400  linezolid (ZYVOX) IVPB 600 mg  Status:  Discontinued     600 mg 300 mL/hr over 60 Minutes Intravenous Every 12 hours 09/10/17 1344 09/10/17 1414   09/09/17 1400  vancomycin (VANCOCIN) IVPB 1000 mg/200 mL premix  Status:  Discontinued     1,000 mg 200 mL/hr over 60 Minutes Intravenous Every 24 hours 09/09/17 1346 09/10/17 1344   09/09/17 1345   piperacillin-tazobactam (ZOSYN) IVPB 3.375 g  Status:  Discontinued     3.375 g 12.5 mL/hr over 240 Minutes Intravenous Every 8 hours 09/09/17 1344 09/10/17 1410   09/08/17 1000  vancomycin (VANCOCIN) IVPB 1000 mg/200 mL premix     1,000 mg 200 mL/hr over 60 Minutes Intravenous  Once 09/08/17 0946 09/08/17 1100   09/08/17 0950  ceFAZolin (ANCEF) 2-4 GM/100ML-% IVPB    Comments:  Bridget Hartshorn   : cabinet override      09/08/17 0950 09/08/17 1040  09/08/17 0947  vancomycin (VANCOCIN) 1-5 GM/200ML-% IVPB  Status:  Discontinued    Comments:  Bridget Hartshorn   : cabinet override      09/08/17 0947 09/08/17 0956   09/08/17 0941  vancomycin (VANCOCIN) 1-5 GM/200ML-% IVPB    Comments:  Dellie Catholic   : cabinet override      09/08/17 0941 09/08/17 1000   09/08/17 0810  ceFAZolin (ANCEF) IVPB 2g/100 mL premix     2 g 200 mL/hr over 30 Minutes Intravenous On call to O.R. 09/08/17 0811 09/08/17 1050      Assessment/Plan: s/p Procedure(s): Laparoscopic reduction of hiatal hernia and placement of G-tube  Exploratory laparotomy with takedown of G tube, repair gastrostomy abdominal washout  Postoperative respiratory failure, improved.  Postoperative pelvic abscess status post percutaneous drainage yesterday. Colitis by CT scan with C. difficile negative  Patient pulled NG yesterday. Some nausea. We will observe  and try not to replace NG tube unless she has vomiting.  ID: On Zosyn, Zyvox and  Eraxis with blood and fluid cultures positive for Candida Glabrata  FEN: May need TNA if ileus does not resolve quickly.  Some increase in BUN/creatinine. Increase IV fluid.   LOS: 9 days    Delfin Squillace T 09/18/2017

## 2017-09-18 NOTE — Progress Notes (Signed)
Hypoglycemic Event  CBG: 61  Treatment: D50 IV 25 mL  Symptoms: None  Follow-up CBG: Time: 2031 CBG Result: 86  Possible Reasons for Event: Inadequate meal intake  Comments/MD notified:pt on D51/2NS at 57ml/hr    Cassundra Mckeever, Daralene Milch

## 2017-09-18 NOTE — Progress Notes (Signed)
Referring Physician(s): Dr. Rosendo Gros  Supervising Physician: Aletta Edouard  Patient Status:  [facility] - In-pt  Chief Complaint: Pelvic peritoneal abscess  Subjective: Patient nauseated this AM.  Going to attempt getting up to chair.   Allergies: Zithromax [azithromycin]; Aspirin; Ibuprofen; Iron; and Sulfa antibiotics  Medications: Prior to Admission medications   Medication Sig Start Date End Date Taking? Authorizing Provider  albuterol (PROVENTIL HFA;VENTOLIN HFA) 108 (90 Base) MCG/ACT inhaler Inhale 2 puffs into the lungs every 6 (six) hours as needed for wheezing. 12/09/15  Yes Rama, Venetia Maxon, MD  capsicum oleoresin (TRIXAICIN) 0.025 % cream Apply topically 3 (three) times daily. Patient taking differently: Apply 1 application topically 3 (three) times daily as needed (abscess).  06/15/17  Yes Nita Sells, MD  cyanocobalamin (,VITAMIN B-12,) 1000 MCG/ML injection INJECT 1000 MCG IM ONCE MONTHLY 07/09/17  Yes Rehman, Mechele Dawley, MD  dicyclomine (BENTYL) 10 MG/5ML syrup TAKE 10 MLS (20 MG TOTAL) BY MOUTH 3 (THREE) TIMES DAILY AS NEEDED. Patient taking differently: Take 20 mg by mouth 3 (three) times daily as needed (stomach cramps).  07/26/17  Yes Rehman, Mechele Dawley, MD  diphenoxylate-atropine (LOMOTIL) 2.5-0.025 MG tablet TAKE 1 TABLET BY MOUTH FOUR TIMES A DAY AS NEEDED Patient taking differently: Take one tablet twice daily. 06/28/17  Yes Setzer, Rona Ravens, NP  furosemide (LASIX) 20 MG tablet Take 1 tablet (20 mg total) by mouth daily as needed (Take for weight gain of more than 2 pounds in a day or 5 pounds in 2 days.). 06/16/17  Yes Lavina Hamman, MD  HYDROcodone-acetaminophen (NORCO) 7.5-325 MG tablet Take 1 tablet by mouth every 12 (twelve) hours as needed for moderate pain.   Yes [provider]  nystatin (NYSTATIN) powder Apply 1 g topically 2 (two) times daily as needed (for itching/irritation).   Yes [provider]  pantoprazole (PROTONIX) 40 MG tablet  Take 1 tablet (40 mg total) by mouth daily. 06/16/17  Yes Nita Sells, MD  Simethicone (PHAZYME PO) Take 1 tablet by mouth daily as needed (gas).    Yes [provider]  sodium bicarbonate 650 MG tablet TAKE 1 TABLET (650 MG TOTAL) BY MOUTH 2 (TWO) TIMES DAILY. 05/14/17  Yes [provider]  spironolactone (ALDACTONE) 50 MG tablet Take 50 mg by mouth daily.  06/23/17  Yes [provider]  oxyCODONE (OXY IR/ROXICODONE) 5 MG immediate release tablet Take 1-2 tablets (5-10 mg total) by mouth every 4 (four) hours as needed for moderate pain. 09/09/17   Ralene Ok, MD     Vital Signs: BP 115/62   Pulse (!) 107   Temp 98.9 F (37.2 C) (Oral)   Resp (!) 33   Ht 5' 4.5" (1.638 m)   Wt 133 lb 2.5 oz (60.4 kg)   SpO2 97%   BMI 22.50 kg/m   Physical Exam  NAD, alert Abd:  Soft, surgical drains intact.  Transgluteal drain intact on right with bloody output. Flushes easily per RN.   Imaging: Ct Abdomen Pelvis W Contrast  Result Date: 09/16/2017 CLINICAL DATA:  admitted for surgical intervention of hiatal hernia. She had laparoscopic gastropexy, lysis of adhesions, and G tube placement. Developed septic shock and VDRF post op from pneumoperitoneum with purulent peritonitis with concern for G tube leak.HX crohns, hiatal hernia, bowel resection, appendectomy, cholecystectomy EXAM: CT ABDOMEN AND PELVIS WITH CONTRAST TECHNIQUE: Multidetector CT imaging of the abdomen and pelvis was performed using the standard protocol following bolus administration of intravenous contrast. CONTRAST:  26mL  ISOVUE-300 IOPAMIDOL (ISOVUE-300) INJECTION 61% COMPARISON:  09/09/2017 FINDINGS: Lower chest: Moderate bilateral pleural effusions. Extensive atelectasis/ consolidation in the dependent aspect of both lower lobes. Right subclavian central line to the cavoatrial junction. Large hiatal hernia containing gastric fundus and a large amount of mesenteric fat, with surrounding fluid which  may be ascites or hemorrhage. Nasogastric tube in place. Two surgical drains from the abdomen extend into the hiatal hernia region. Hepatobiliary: Liver unremarkable. Cholecystectomy clips. 4.4 x2.5 cm fluid collection at inferior aspect of the falciform ligament, without surrounding enhancement. No intraparenchymal liver lesion. No biliary ductal dilatation. Pancreas: Unremarkable. No pancreatic ductal dilatation or surrounding inflammatory changes. Spleen: Normal in size without focal abnormality. Adrenals/Urinary Tract: Normal adrenals. Kidneys unremarkable. Foley catheter decompresses the urinary bladder. Stomach/Bowel: Interval removal of gastrostomy tube. Stomach is decompressed, the fundus extending into the hiatal hernia as above. Proximal small bowel decompressed. There is some distended fluid-filled loops of distal small bowel long-term including terminal ileum showing circumferential mild wall thickening. There is contiguous circumferential wall thickening in the proximal ascending colon, also segmentally in the splenic flexure of the colon. There is moderate inflammatory/edematous change around the splenic flexure without abscess. Colon is nondilated. Rectal tube in place. Vascular/Lymphatic: No significant vascular findings are present. No enlarged abdominal or pelvic lymph nodes. Reproductive: Status post hysterectomy. No adnexal masses. Other: Loculated peripherally enhancing fluid collection in the cul-de-sac measured approximately 8.7 x 2.1 cm maximum transverse dimensions. There is scattered fluid among leaves of the mesentery. No free air. Musculoskeletal: Stable mild L1 compression deformity. No acute fracture or worrisome bone lesion. 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 IMPRESSION: 1. Complex postop fluid collection in the large hiatal hernia may represent ascites and edematous mesentery versus hemorrhage. 2. 8.7 cm peripherally enhancing cul-de-sac fluid collection, possible abscess.  3. Circumferential wall thickening in distal ileum, proximal ascending colon, and splenic flexure with surrounding inflammatory/edematous changes consistent with colitis. 4. Increase in bilateral pleural effusions. Electronically Signed   By: Lucrezia Europe M.D.   On: 09/16/2017 19:36   Dg Abd Portable 1v  Result Date: 09/14/2017 CLINICAL DATA:  Ileus EXAM: PORTABLE ABDOMEN - 1 VIEW COMPARISON:  09/09/2017 FINDINGS: NG tube tip projects over the mid stomach. Nonobstructive bowel gas pattern. No free air organomegaly. IMPRESSION: No evidence of bowel obstruction or ileus. Electronically Signed   By: Rolm Baptise M.D.   On: 09/14/2017 09:32   Ct Image Guided Drainage By Percutaneous Catheter  Result Date: 09/18/2017 CLINICAL DATA:  Status post abdominal surgery with development of peritonitis. Worsening leukocytosis with cul de sac pelvic abscess. EXAM: CT GUIDED CATHETER DRAINAGE OF PELVIC PERITONEAL ABSCESS ANESTHESIA/SEDATION: 3.0 mg IV Versed 100 mcg IV Fentanyl Total Moderate Sedation Time:  11 minutes The patient's level of consciousness and physiologic status were continuously monitored during the procedure by Radiology nursing. PROCEDURE: The procedure, risks, benefits, and alternatives were explained to the patient. Questions regarding the procedure were encouraged and answered. The patient understands and consents to the procedure. A time out was performed prior to initiating the procedure. The right transgluteal region was prepped with chlorhexidine in a sterile fashion, and a sterile drape was applied covering the operative field. A sterile gown and sterile gloves were used for the procedure. Local anesthesia was provided with 1% Lidocaine. The patient was placed in a near decubitus position with the right side up. From a posterior transgluteal approach, an 18 gauge trocar needle was advanced to the level of a pelvic abscess situated posterior to  the bladder and anterior to the rectum. After needle  entry, aspiration was performed and a fluid sample sent for culture analysis. A guidewire was advanced into the collection. The tract was dilated and a 10 French percutaneous drainage catheter placed. Catheter position was confirmed by CT. The catheter was flushed and connected to a suction bulb. The catheter was secured at the skin with a Prolene retention suture and StatLock device. COMPLICATIONS: None FINDINGS: Aspiration at the level of the pelvic fluid collection yielded bloody and slightly turbid fluid. A sample was sent for culture analysis. After drain placement, there is excellent return of fluid. IMPRESSION: CT-guided percutaneous catheter drainage of pelvic abscess from a posterior right transgluteal approach. There was return of bloody and turbid fluid with a sample sent for culture analysis. A 10 French drain was placed and attached to suction bulb drainage. Electronically Signed   By: Aletta Edouard M.D.   On: 09/18/2017 08:39    Labs:  CBC:  Recent Labs  09/15/17 0400 09/16/17 0545 09/17/17 0500 09/18/17 0547  WBC 16.6* 20.8* 22.3* 23.3*  HGB 10.9* 10.5* 10.2* 10.1*  HCT 32.7* 31.2* 30.5* 30.7*  PLT 69* 51* 36* 121*    COAGS:  Recent Labs  05/06/17 2133 05/07/17 0244 09/17/17 1021  INR 1.18  --  1.24  APTT  --  21*  --     BMP:  Recent Labs  09/15/17 0400 09/16/17 0545 09/17/17 0500 09/18/17 0547  NA 146* 145 144 145  K 2.9* 2.9* 3.5 4.3  CL 116* 108 102 104  CO2 20* 24 28 26   GLUCOSE 164* 123* 112* 94  BUN 12 18 25* 28*  CALCIUM 7.7* 7.5* 7.6* 7.4*  CREATININE 1.70* 1.78* 1.94* 2.15*  GFRNONAA 30* 28* 25* 22*  GFRAA 34* 32* 29* 26*    LIVER FUNCTION TESTS:  Recent Labs  07/28/17 0500 09/10/17 0851 09/11/17 0509 09/13/17 0500  BILITOT 0.4 0.6 0.7 1.2  AST 21 48* 45* 19  ALT 15 21 29 14   ALKPHOS 102 91 93 75  PROT 7.0 5.9* 4.4* 5.7*  ALBUMIN 2.8* 2.4* 1.8* 3.2*    Assessment and Plan: Pelvic peritoneal abscess TG drain intact with  bloody output.  WBC 23.3 this AM.  Tmax 100.3 overnight.  Cultures pending.  Continue with routine flushes and drain care.  IR to follow.   Electronically Signed: Docia Barrier, PA 09/18/2017, 9:09 AM   I spent a total of 15 Minutes at the the patient's bedside AND on the patient's hospital floor or unit, greater than 50% of which was counseling/coordinating care for pelvic peritoneal abscess

## 2017-09-18 NOTE — Progress Notes (Signed)
PROGRESS NOTE    Danielle [name]  YPP:[id number] DOB: [birth year] DOA: 09/08/2017 PCP: Iona Beard, MD    Brief Narrative:  [phone] who presented for surgical correction of hiatal hernia. Pt developed septic shock post-operatively with purulent peritonitis. Initially followed by CCM. Hospitalist now following for medical management  Assessment & Plan:   Principal Problem:   Respiratory failure, post-operative ([place]) Active Problems:   Hypomagnesemia   Acute renal failure superimposed on stage 3 chronic kidney disease ([place])   Hiatal hernia   Immunosuppressed status ([place])   Rectovaginal fistula   Gastrostomy tube in place [facility])   History of repair of hiatal hernia   Peritonitis (HCC)   Fungemia   Encephalopathy acute   Anasarca   Electrolyte imbalance  1. Acute hypoxic resp failure, post-operatively 1. Followed by CCM 2. Remains on minimal O2 support 3. Speaking on full sentences 2. Acute metabolic encephalopathy 1. Improved 2. Conversant 3. Anasarca 1. Suspect secondary to recent voluem resuscitation 2. S/p aggressive diuresis with IV lasix 3. Much improved 4. Lasix stopped secondary to rising Cr 5. Repeat bmet in AM 4. Acute on CKD3 1. Cr rising 2. Have stopped IV lasix 3. Start gentle IVF hydration 5. Hypomagnesemia 1. Continue to replace as needed 6. Hypokalemia 1. Corrected 2. Hold scheduled potassium 7. Peritonitis 1. Cont per CCM 2. Earlier on linezolid with zosyn. Linezolid stopped secondary to decreasing platelets 3. Continue abx per ID recs 8. Fungemia 1. Per ID, continue anidulafungin for now 9. Intra-abdominal fluid collection 1. Per IR, 8.7cm culdesac collection s/p drain 2. Follow cx  DVT prophylaxis: SCD's Code Status: Full Family Communication: Pt in room, family not in room Disposition Plan: Uncertain at this time  Consultants:     Procedures:     Antimicrobials: Anti-infectives    Start     Dose/Rate Route Frequency Ordered Stop     09/13/17 1200  piperacillin-tazobactam (ZOSYN) IVPB 3.375 g     3.375 g 12.5 mL/hr over 240 Minutes Intravenous Every 8 hours 09/13/17 0732     09/12/17 1500  anidulafungin (ERAXIS) 100 mg in sodium chloride 0.9 % 100 mL IVPB  Status:  Discontinued     100 mg 78 mL/hr over 100 Minutes Intravenous Every 24 hours 09/11/17 1448 09/11/17 1500   09/11/17 1500  anidulafungin (ERAXIS) 200 mg in sodium chloride 0.9 % 200 mL IVPB  Status:  Discontinued     200 mg 78 mL/hr over 200 Minutes Intravenous  Once 09/11/17 1448 09/11/17 1500   09/11/17 1400  anidulafungin (ERAXIS) 100 mg in sodium chloride 0.9 % 100 mL IVPB     100 mg 78 mL/hr over 100 Minutes Intravenous Every 24 hours 09/10/17 1246     09/11/17 0000  piperacillin-tazobactam (ZOSYN) IVPB 2.25 g  Status:  Discontinued     2.25 g 100 mL/hr over 30 Minutes Intravenous Every 6 hours 09/10/17 1410 09/13/17 0732   09/10/17 1430  linezolid (ZYVOX) IVPB 600 mg  Status:  Discontinued     600 mg 300 mL/hr over 60 Minutes Intravenous Every 12 hours 09/10/17 1414 09/16/17 1356   09/10/17 1400  anidulafungin (ERAXIS) 200 mg in sodium chloride 0.9 % 200 mL IVPB     200 mg 78 mL/hr over 200 Minutes Intravenous  Once 09/10/17 1246 09/10/17 1651   09/10/17 1400  linezolid (ZYVOX) IVPB 600 mg  Status:  Discontinued     600 mg 300 mL/hr over 60 Minutes Intravenous Every 12 hours 09/10/17 1344 09/10/17 1414  09/09/17 1400  vancomycin (VANCOCIN) IVPB 1000 mg/200 mL premix  Status:  Discontinued     1,000 mg 200 mL/hr over 60 Minutes Intravenous Every 24 hours 09/09/17 1346 09/10/17 1344   09/09/17 1345  piperacillin-tazobactam (ZOSYN) IVPB 3.375 g  Status:  Discontinued     3.375 g 12.5 mL/hr over 240 Minutes Intravenous Every 8 hours 09/09/17 1344 09/10/17 1410   09/08/17 1000  vancomycin (VANCOCIN) IVPB 1000 mg/200 mL premix     1,000 mg 200 mL/hr over 60 Minutes Intravenous  Once 09/08/17 0946 09/08/17 1100   09/08/17 0950  ceFAZolin (ANCEF) 2-4  GM/100ML-% IVPB    Comments:  Bridget Hartshorn   : cabinet override      09/08/17 0950 09/08/17 1040   09/08/17 0947  vancomycin (VANCOCIN) 1-5 GM/200ML-% IVPB  Status:  Discontinued    Comments:  Bridget Hartshorn   : cabinet override      09/08/17 0947 09/08/17 0956   09/08/17 0941  vancomycin (VANCOCIN) 1-5 GM/200ML-% IVPB    Comments:  [place] Ron   : cabinet override      09/08/17 0941 09/08/17 1000   09/08/17 0810  ceFAZolin (ANCEF) IVPB 2g/100 mL premix     2 g 200 mL/hr over 30 Minutes Intravenous On call to O.R. 09/08/17 0811 09/08/17 1050      Subjective: Eager to eat. Pulled out NG overnight  Objective: Vitals:   09/18/17 0600 09/18/17 0800 09/18/17 1000 09/18/17 1200  BP: 115/62 (!) 104/50 (!) 120/59 (!) 105/50  Pulse: (!) 107 (!) 105 (!) 36 (!) 101  Resp: (!) 33 (!) 27 (!) 21 (!) 30  Temp:  98.9 F (37.2 C)  99.6 F (37.6 C)  TempSrc:  Oral  Oral  SpO2: 97% 98% 99% 100%  Weight:      Height:        Intake/Output Summary (Last 24 hours) at 09/18/17 1404 Last data filed at 09/18/17 1300  Gross per 24 hour  Intake          1719.45 ml  Output             2360 ml  Net          -640.55 ml   Filed Weights   09/16/17 0600 09/17/17 0615 09/18/17 0435  Weight: 62.9 kg (138 lb 10.7 oz) 60.8 kg (134 lb 0.6 oz) 60.4 kg (133 lb 2.5 oz)    Examination: General exam: Conversant, in no acute distress Respiratory system: normal chest rise, clear, no audible wheezing Cardiovascular system: regular rhythm, s1-s2 Gastrointestinal system: Nondistended, nontender, pos BS Central nervous system: No seizures, no tremors Extremities: No cyanosis, no joint deformities Skin: No rashes, no pallor Psychiatry: Affect normal // no auditory hallucinations    Data Reviewed: I have personally reviewed following labs and imaging studies  CBC:  Recent Labs Lab 09/14/17 0347 09/15/17 0400 09/16/17 0545 09/17/17 0500 09/18/17 0547  WBC 15.2* 16.6* 20.8* 22.3* 23.3*  NEUTROABS  11.4* 11.3* 14.1* 15.7* 14.3*  HGB 10.4* 10.9* 10.5* 10.2* 10.1*  HCT 31.4* 32.7* 31.2* 30.5* 30.7*  MCV 85.8 85.8 86.0 86.6 85.5  PLT 83* 69* 51* 36* 588*   Basic Metabolic Panel:  Recent Labs Lab 09/13/17 0500 09/14/17 0347 09/15/17 0400 09/16/17 0545 09/17/17 0500 09/18/17 0547  NA 149* 150* 146* 145 144 145  K 3.3* 3.2* 2.9* 2.9* 3.5 4.3  CL 121* 124* 116* 108 102 104  CO2 19* 17* 20* 24 28 26   GLUCOSE 104* 114* 164* 123*  112* 94  BUN 14 11 12 18  25* 28*  CREATININE 1.76* 1.59* 1.70* 1.78* 1.94* 2.15*  CALCIUM 7.5* 7.3* 7.7* 7.5* 7.6* 7.4*  MG 1.8 1.8 1.5* 1.8 1.6*  --   PHOS 4.0 3.5 2.8 3.3 4.7*  --    GFR: Estimated Creatinine Clearance: 21.8 mL/min (A) (by C-G formula based on SCr of 2.15 mg/dL (H)). Liver Function Tests:  Recent Labs Lab 09/13/17 0500  AST 19  ALT 14  ALKPHOS 75  BILITOT 1.2  PROT 5.7*  ALBUMIN 3.2*   No results for input(s): LIPASE, AMYLASE in the last 168 hours. No results for input(s): AMMONIA in the last 168 hours. Coagulation Profile:  Recent Labs Lab 09/17/17 1021  INR 1.24   Cardiac Enzymes: No results for input(s): CKTOTAL, CKMB, CKMBINDEX, TROPONINI in the last 168 hours. BNP (last 3 results) No results for input(s): PROBNP in the last 8760 hours. HbA1C: No results for input(s): HGBA1C in the last 72 hours. CBG:  Recent Labs Lab 09/17/17 2000 09/17/17 2031 09/18/17 0015 09/18/17 0401 09/18/17 0752  GLUCAP 61* 86 98 85 83   Lipid Profile: No results for input(s): CHOL, HDL, LDLCALC, TRIG, CHOLHDL, LDLDIRECT in the last 72 hours. Thyroid Function Tests: No results for input(s): TSH, T4TOTAL, FREET4, T3FREE, THYROIDAB in the last 72 hours. Anemia Panel: No results for input(s): VITAMINB12, FOLATE, FERRITIN, TIBC, IRON, RETICCTPCT in the last 72 hours. Sepsis Labs: No results for input(s): PROCALCITON, LATICACIDVEN in the last 168 hours.  Recent Results (from the past 240 hour(s))  Culture, blood (Routine X 2)  w Reflex to ID Panel     Status: Abnormal   Collection Time: 09/09/17  2:09 PM  Result Value Ref Range Status   Specimen Description BLOOD RIGHT ANTECUBITAL  Final   Special Requests IN PEDIATRIC BOTTLE Blood Culture adequate volume  Final   Culture  Setup Time   Final    BUDDING YEAST SEEN IN PEDIATRIC BOTTLE CRITICAL RESULT CALLED TO, READ BACK BY AND VERIFIED WITH: N GLOGOVAC,PHARMD AT 1652 09/11/17 BY L BENFIELD Performed at [facility], [place] [address] [place], [place] [address]    Culture CANDIDA GLABRATA (A)  Final   Report Status 09/13/2017 FINAL  Final  Blood Culture ID Panel (Reflexed)     Status: Abnormal   Collection Time: 09/09/17  2:09 PM  Result Value Ref Range Status   Enterococcus species NOT DETECTED NOT DETECTED Final   Listeria monocytogenes NOT DETECTED NOT DETECTED Final   Staphylococcus species NOT DETECTED NOT DETECTED Final   Staphylococcus aureus NOT DETECTED NOT DETECTED Final   Streptococcus species NOT DETECTED NOT DETECTED Final   Streptococcus agalactiae NOT DETECTED NOT DETECTED Final   Streptococcus pneumoniae NOT DETECTED NOT DETECTED Final   Streptococcus pyogenes NOT DETECTED NOT DETECTED Final   Acinetobacter baumannii NOT DETECTED NOT DETECTED Final   Enterobacteriaceae species NOT DETECTED NOT DETECTED Final   Enterobacter cloacae complex NOT DETECTED NOT DETECTED Final   Escherichia coli NOT DETECTED NOT DETECTED Final   Klebsiella oxytoca NOT DETECTED NOT DETECTED Final   Klebsiella pneumoniae NOT DETECTED NOT DETECTED Final   Proteus species NOT DETECTED NOT DETECTED Final   Serratia marcescens NOT DETECTED NOT DETECTED Final   Haemophilus influenzae NOT DETECTED NOT DETECTED Final   Neisseria meningitidis NOT DETECTED NOT DETECTED Final   Pseudomonas aeruginosa NOT DETECTED NOT DETECTED Final   Candida albicans NOT DETECTED NOT DETECTED Final   Candida glabrata DETECTED (A) NOT DETECTED Final  Comment: CRITICAL RESULT CALLED TO,  READ BACK BY AND VERIFIED WITH: N GLOGOVAC,PHARMD AT 1652 09/11/17 BY L BENFIELD    Candida krusei NOT DETECTED NOT DETECTED Final   Candida parapsilosis NOT DETECTED NOT DETECTED Final   Candida tropicalis NOT DETECTED NOT DETECTED Final    Comment: Performed at [facility], [address] [place], [place] [address]  Culture, blood (Routine X 2) w Reflex to ID Panel     Status: Abnormal   Collection Time: 09/09/17  2:34 PM  Result Value Ref Range Status   Specimen Description BLOOD RIGHT HAND  Final   Special Requests IN PEDIATRIC BOTTLE Blood Culture adequate volume  Final   Culture  Setup Time   Final    YEAST IN PEDIATRIC BOTTLE CRITICAL RESULT CALLED TO, READ BACK BY AND VERIFIED WITH: J. GADHIA,PHARMD 1946 09/11/2017 T. TYSOR    Culture CANDIDA GLABRATA (A)  Final   Report Status 09/14/2017 FINAL  Final  Culture, Urine     Status: None   Collection Time: 09/10/17  1:49 PM  Result Value Ref Range Status   Specimen Description URINE, RANDOM  Final   Special Requests NONE  Final   Culture   Final    NO GROWTH Performed at [facility], [place] [address] [place], [place] [address]    Report Status 09/11/2017 FINAL  Final  Culture, blood (routine x 2)     Status: None (Preliminary result)   Collection Time: 09/15/17  8:23 AM  Result Value Ref Range Status   Specimen Description BLOOD LEFT HAND  Final   Special Requests IN PEDIATRIC BOTTLE Blood Culture adequate volume  Final   Culture   Final    NO GROWTH 2 DAYS Performed at [facility], [place] [address], [place], [place] [address]    Report Status PENDING  Incomplete  Culture, blood (routine x 2)     Status: None (Preliminary result)   Collection Time: 09/15/17  8:23 AM  Result Value Ref Range Status   Specimen Description BLOOD LEFT HAND  Final   Special Requests IN PEDIATRIC BOTTLE Blood Culture adequate volume  Final   Culture   Final    NO GROWTH 2 DAYS Performed at [facility], [place]  [address] [place], [place] [address]    Report Status PENDING  Incomplete  C difficile quick scan w PCR reflex     Status: None   Collection Time: 09/16/17  2:15 PM  Result Value Ref Range Status   C Diff antigen NEGATIVE NEGATIVE Final   C Diff toxin NEGATIVE NEGATIVE Final   C Diff interpretation No C. difficile detected.  Final  Aerobic/Anaerobic Culture (surgical/deep wound)     Status: None (Preliminary result)   Collection Time: 09/17/17  7:30 PM  Result Value Ref Range Status   Specimen Description ABSCESS PELVIC  Final   Special Requests NONE  Final   Gram Stain   Final    ABUNDANT WBC PRESENT, PREDOMINANTLY PMN RARE YEAST Performed at [facility], [place] [address] [place], [place] [address]    Culture PENDING  Incomplete   Report Status PENDING  Incomplete     Radiology Studies: Ct Abdomen Pelvis W Contrast  Result Date: 09/16/2017 CLINICAL DATA:  admitted for surgical intervention of hiatal hernia. She had laparoscopic gastropexy, lysis of adhesions, and G tube placement. Developed septic shock and VDRF post op from pneumoperitoneum with purulent peritonitis with concern for G  tube leak.HX crohns, hiatal hernia, bowel resection, appendectomy, cholecystectomy EXAM: CT ABDOMEN AND PELVIS WITH CONTRAST TECHNIQUE: Multidetector CT imaging of the abdomen and pelvis was performed using the standard protocol following bolus administration of intravenous contrast. CONTRAST:  78m ISOVUE-300 IOPAMIDOL (ISOVUE-300) INJECTION 61% COMPARISON:  09/09/2017 FINDINGS: Lower chest: Moderate bilateral pleural effusions. Extensive atelectasis/ consolidation in the dependent aspect of both lower lobes. Right subclavian central line to the cavoatrial junction. Large hiatal hernia containing gastric fundus and a large amount of mesenteric fat, with surrounding fluid which may be ascites or hemorrhage. Nasogastric tube in place. Two surgical drains from the abdomen extend into the hiatal hernia region.  Hepatobiliary: Liver unremarkable. Cholecystectomy clips. 4.4 x2.5 cm fluid collection at inferior aspect of the falciform ligament, without surrounding enhancement. No intraparenchymal liver lesion. No biliary ductal dilatation. Pancreas: Unremarkable. No pancreatic ductal dilatation or surrounding inflammatory changes. Spleen: Normal in size without focal abnormality. Adrenals/Urinary Tract: Normal adrenals. Kidneys unremarkable. Foley catheter decompresses the urinary bladder. Stomach/Bowel: Interval removal of gastrostomy tube. Stomach is decompressed, the fundus extending into the hiatal hernia as above. Proximal small bowel decompressed. There is some distended fluid-filled loops of distal small bowel long-term including terminal ileum showing circumferential mild wall thickening. There is contiguous circumferential wall thickening in the proximal ascending colon, also segmentally in the splenic flexure of the colon. There is moderate inflammatory/edematous change around the splenic flexure without abscess. Colon is nondilated. Rectal tube in place. Vascular/Lymphatic: No significant vascular findings are present. No enlarged abdominal or pelvic lymph nodes. Reproductive: Status post hysterectomy. No adnexal masses. Other: Loculated peripherally enhancing fluid collection in the cul-de-sac measured approximately 8.7 x 2.1 cm maximum transverse dimensions. There is scattered fluid among leaves of the mesentery. No free air. Musculoskeletal: Stable mild L1 compression deformity. No acute fracture or worrisome bone lesion. 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 IMPRESSION: 1. Complex postop fluid collection in the large hiatal hernia may represent ascites and edematous mesentery versus hemorrhage. 2. 8.7 cm peripherally enhancing cul-de-sac fluid collection, possible abscess. 3. Circumferential wall thickening in distal ileum, proximal ascending colon, and splenic flexure with surrounding  inflammatory/edematous changes consistent with colitis. 4. Increase in bilateral pleural effusions. Electronically Signed   By: DLucrezia EuropeM.D.   On: 09/16/2017 19:36   Ct Image Guided Drainage By Percutaneous Catheter  Result Date: 09/18/2017 CLINICAL DATA:  Status post abdominal surgery with development of peritonitis. Worsening leukocytosis with cul de sac pelvic abscess. EXAM: CT GUIDED CATHETER DRAINAGE OF PELVIC PERITONEAL ABSCESS ANESTHESIA/SEDATION: 3.0 mg IV Versed 100 mcg IV Fentanyl Total Moderate Sedation Time:  11 minutes The patient's level of consciousness and physiologic status were continuously monitored during the procedure by Radiology nursing. PROCEDURE: The procedure, risks, benefits, and alternatives were explained to the patient. Questions regarding the procedure were encouraged and answered. The patient understands and consents to the procedure. A time out was performed prior to initiating the procedure. The right transgluteal region was prepped with chlorhexidine in a sterile fashion, and a sterile drape was applied covering the operative field. A sterile gown and sterile gloves were used for the procedure. Local anesthesia was provided with 1% Lidocaine. The patient was placed in a near decubitus position with the right side up. From a posterior transgluteal approach, an 18 gauge trocar needle was advanced to the level of a pelvic abscess situated posterior to the bladder and anterior to the rectum. After needle entry, aspiration was performed and a fluid sample sent for culture analysis. A  guidewire was advanced into the collection. The tract was dilated and a 10 French percutaneous drainage catheter placed. Catheter position was confirmed by CT. The catheter was flushed and connected to a suction bulb. The catheter was secured at the skin with a Prolene retention suture and StatLock device. COMPLICATIONS: None FINDINGS: Aspiration at the level of the pelvic fluid collection yielded  bloody and slightly turbid fluid. A sample was sent for culture analysis. After drain placement, there is excellent return of fluid. IMPRESSION: CT-guided percutaneous catheter drainage of pelvic abscess from a posterior right transgluteal approach. There was return of bloody and turbid fluid with a sample sent for culture analysis. A 10 French drain was placed and attached to suction bulb drainage. Electronically Signed   By: Aletta Edouard M.D.   On: 09/18/2017 08:39    Scheduled Meds: . chlorhexidine gluconate (MEDLINE KIT)  15 mL Mouth Rinse BID  . Chlorhexidine Gluconate Cloth  6 each Topical Q0600  . insulin aspart  0-9 Units Subcutaneous Q4H  . mouth rinse  15 mL Mouth Rinse QID  . pantoprazole (PROTONIX) IV  40 mg Intravenous Q24H  . sodium chloride flush  10 mL Intravenous Q8H  . sodium chloride flush  10-40 mL Intracatheter Q12H   Continuous Infusions: . anidulafungin Stopped (09/17/17 1558)  . dextrose 5 % and 0.45% NaCl 10 mL/hr at 09/16/17 0539  . dextrose 5 % and 0.9% NaCl 75 mL/hr at 09/18/17 0917  . feeding supplement (VITAL 1.5 CAL) Stopped (09/17/17 0900)  . piperacillin-tazobactam (ZOSYN)  IV 3.375 g (09/18/17 1201)     LOS: 9 days   CHIU, Orpah Melter, MD [facility] Pager [phone]  If 7PM-7AM, please contact night-coverage [email or web address] Password [email or web address] 09/18/2017, 2:04 PM

## 2017-09-19 LAB — CBC WITH DIFFERENTIAL/PLATELET
BASOS ABS: 0 10*3/uL (ref 0.0–0.1)
Basophils Relative: 0 %
EOS ABS: 0.2 10*3/uL (ref 0.0–0.7)
Eosinophils Relative: 1 %
HCT: 27.6 % — ABNORMAL LOW (ref 36.0–46.0)
Hemoglobin: 9 g/dL — ABNORMAL LOW (ref 12.0–15.0)
LYMPHS ABS: 4.2 10*3/uL — AB (ref 0.7–4.0)
LYMPHS PCT: 22 %
MCH: 28.4 pg (ref 26.0–34.0)
MCHC: 32.6 g/dL (ref 30.0–36.0)
MCV: 87.1 fL (ref 78.0–100.0)
MONOS PCT: 15 %
Monocytes Absolute: 2.9 10*3/uL — ABNORMAL HIGH (ref 0.1–1.0)
NEUTROS ABS: 11.8 10*3/uL — AB (ref 1.7–7.7)
NEUTROS PCT: 62 %
PLATELETS: 79 10*3/uL — AB (ref 150–400)
RBC: 3.17 MIL/uL — AB (ref 3.87–5.11)
RDW: 15.6 % — ABNORMAL HIGH (ref 11.5–15.5)
WBC: 19.1 10*3/uL — ABNORMAL HIGH (ref 4.0–10.5)

## 2017-09-19 LAB — BASIC METABOLIC PANEL
Anion gap: 8 (ref 5–15)
BUN: 22 mg/dL — AB (ref 6–20)
CO2: 24 mmol/L (ref 22–32)
Calcium: 6.8 mg/dL — ABNORMAL LOW (ref 8.9–10.3)
Chloride: 113 mmol/L — ABNORMAL HIGH (ref 101–111)
Creatinine, Ser: 1.97 mg/dL — ABNORMAL HIGH (ref 0.44–1.00)
GFR calc Af Amer: 29 mL/min — ABNORMAL LOW (ref >60)
GFR, EST NON AFRICAN AMERICAN: 25 mL/min — AB (ref >60)
Glucose, Bld: 345 mg/dL — ABNORMAL HIGH (ref 65–99)
POTASSIUM: 3.4 mmol/L — AB (ref 3.5–5.1)
SODIUM: 145 mmol/L (ref 135–145)

## 2017-09-19 LAB — GLUCOSE, CAPILLARY
GLUCOSE-CAPILLARY: 111 mg/dL — AB (ref 65–99)
GLUCOSE-CAPILLARY: 137 mg/dL — AB (ref 65–99)
Glucose-Capillary: 95 mg/dL (ref 65–99)
Glucose-Capillary: 81 mg/dL (ref 65–99)

## 2017-09-19 NOTE — Progress Notes (Signed)
PROGRESS NOTE    Danielle [name]  ZOX:[id number] DOB: 1945/12/26 DOA: 09/08/2017 PCP: Iona Beard, MD    Brief Narrative:  [phone] who presented for surgical correction of hiatal hernia. Pt developed septic shock post-operatively with purulent peritonitis. Initially followed by CCM. 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)   History of repair of hiatal hernia   Peritonitis (HCC)   Fungemia   Encephalopathy acute   Anasarca   Electrolyte imbalance  1. Acute hypoxic resp failure, post-operatively 1. Patient had been followed by CCM 2. Remains on minimal O2 support 3. Continues to speak in full sentences  2. Acute metabolic encephalopathy 1. Improved 2. Alert and oriented 3. Anasarca 1. Suspect secondary to recent voluem resuscitation 2. S/p aggressive diuresis with IV lasix 3. Edema has improved 4. Lasix now stopped secondary to elevated Cr. See below. 4. Acute on CKD3 1. Cr rising 2. Have stopped IV lasix 3. Patient continued on gentle IVF 4. Cr has improved 5. Repeat bmet in AM 5. Hypomagnesemia 1. Continue to replace as needed 6. Hypokalemia 1. Corrected 2. Hold scheduled potassium 7. Peritonitis 1. Cont per CCM 2. Earlier on linezolid with zosyn. Linezolid had been stopped secondary to decreasing platelets 3. Continue abx per ID recs 8. Fungemia 1. Per ID, continue anidulafungin for now 2. Pelvic abscess has grown rare yeast 9. Intra-abdominal fluid collection 1. Per IR, 8.7cm culdesac collection s/p drain 2. Culture growing rare yeast  DVT prophylaxis: SCD's Code Status: Full Family Communication: Pt in room, family not in room Disposition Plan:  Uncertain at this time  Consultants:     Procedures:     Antimicrobials: Anti-infectives    Start     Dose/Rate Route Frequency Ordered Stop   09/13/17 1200  piperacillin-tazobactam (ZOSYN) IVPB 3.375 g     3.375 g 12.5 mL/hr over 240 Minutes Intravenous Every 8 hours 09/13/17 0732     09/12/17 1500  anidulafungin (ERAXIS) 100 mg in sodium chloride 0.9 % 100 mL IVPB  Status:  Discontinued     100 mg 78 mL/hr over 100 Minutes Intravenous Every 24 hours 09/11/17 1448 09/11/17 1500   09/11/17 1500  anidulafungin (ERAXIS) 200 mg in sodium chloride 0.9 % 200 mL IVPB  Status:  Discontinued     200 mg 78 mL/hr over 200 Minutes Intravenous  Once 09/11/17 1448 09/11/17 1500   09/11/17 1400  anidulafungin (ERAXIS) 100 mg in sodium chloride 0.9 % 100 mL IVPB     100 mg 78 mL/hr over 100 Minutes Intravenous Every 24 hours 09/10/17 1246     09/11/17 0000  piperacillin-tazobactam (ZOSYN) IVPB 2.25 g  Status:  Discontinued     2.25 g 100 mL/hr over 30 Minutes Intravenous Every 6 hours 09/10/17 1410 09/13/17 0732   09/10/17 1430  linezolid (ZYVOX) IVPB 600 mg  Status:  Discontinued     600 mg 300 mL/hr over 60 Minutes Intravenous Every 12 hours 09/10/17 1414 09/16/17 1356   09/10/17 1400  anidulafungin (ERAXIS) 200 mg in sodium chloride 0.9 % 200 mL IVPB     200 mg 78 mL/hr over 200 Minutes Intravenous  Once 09/10/17 1246 09/10/17 1651   09/10/17 1400  linezolid (ZYVOX)  IVPB 600 mg  Status:  Discontinued     600 mg 300 mL/hr over 60 Minutes Intravenous Every 12 hours 09/10/17 1344 09/10/17 1414   09/09/17 1400  vancomycin (VANCOCIN) IVPB 1000 mg/200 mL premix  Status:  Discontinued     1,000 mg 200 mL/hr over 60 Minutes Intravenous Every 24 hours 09/09/17 1346 09/10/17 1344   09/09/17 1345  piperacillin-tazobactam (ZOSYN) IVPB 3.375 g  Status:  Discontinued     3.375 g 12.5 mL/hr over 240 Minutes Intravenous Every 8 hours 09/09/17 1344 09/10/17 1410   09/08/17 1000  vancomycin (VANCOCIN)  IVPB 1000 mg/200 mL premix     1,000 mg 200 mL/hr over 60 Minutes Intravenous  Once 09/08/17 0946 09/08/17 1100   09/08/17 0950  ceFAZolin (ANCEF) 2-4 GM/100ML-% IVPB    Comments:  Bridget Hartshorn   : cabinet override      09/08/17 0950 09/08/17 1040   09/08/17 0947  vancomycin (VANCOCIN) 1-5 GM/200ML-% IVPB  Status:  Discontinued    Comments:  Bridget Hartshorn   : cabinet override      09/08/17 0947 09/08/17 0956   09/08/17 0941  vancomycin (VANCOCIN) 1-5 GM/200ML-% IVPB    Comments:  [place], Ron   : cabinet override      09/08/17 0941 09/08/17 1000   09/08/17 0810  ceFAZolin (ANCEF) IVPB 2g/100 mL premix     2 g 200 mL/hr over 30 Minutes Intravenous On call to O.R. 09/08/17 0811 09/08/17 1050      Subjective: Wanting to have something to drink  Objective: Vitals:   09/19/17 0400 09/19/17 0500 09/19/17 0600 09/19/17 0758  BP: (!) 88/43  (!) 108/52   Pulse: 95  96   Resp: (!) 31  (!) 25   Temp: 99.3 F (37.4 C)   99.7 F (37.6 C)  TempSrc: Oral   Oral  SpO2: 100%  100%   Weight:  64.5 kg (142 lb 3.2 oz)    Height:        Intake/Output Summary (Last 24 hours) at 09/19/17 0832 Last data filed at 09/19/17 0700  Gross per 24 hour  Intake          2073.75 ml  Output             2467 ml  Net          -393.25 ml   Filed Weights   09/17/17 0615 09/18/17 0435 09/19/17 0500  Weight: 60.8 kg (134 lb 0.6 oz) 60.4 kg (133 lb 2.5 oz) 64.5 kg (142 lb 3.2 oz)    Examination: General exam: Awake, laying in bed, in nad Respiratory system: Normal respiratory effort, no wheezing Cardiovascular system: regular rate, s1, s2 Gastrointestinal system: Soft, nondistended, positive BS Central nervous system: CN2-12 grossly intact, strength intact Extremities: Perfused, no clubbing Skin: Normal skin turgor, no notable skin lesions seen Psychiatry: Mood normal // no visual hallucinations   Data Reviewed: I have personally reviewed following labs and imaging studies  CBC:  Recent  Labs Lab 09/15/17 0400 09/16/17 0545 09/17/17 0500 09/18/17 0547 09/19/17 0500  WBC 16.6* 20.8* 22.3* 23.3* 19.1*  NEUTROABS 11.3* 14.1* 15.7* 14.3* 11.8*  HGB 10.9* 10.5* 10.2* 10.1* 9.0*  HCT 32.7* 31.2* 30.5* 30.7* 27.6*  MCV 85.8 86.0 86.6 85.5 87.1  PLT 69* 51* 36* 121* 79*   Basic Metabolic Panel:  Recent Labs Lab 09/13/17 0500 09/14/17 0347 09/15/17 0400 09/16/17 0545 09/17/17 0500 09/18/17 0547 09/19/17 0500  NA 149* 150* 146* 145 144 145 145  K 3.3* 3.2* 2.9* 2.9* 3.5 4.3 3.4*  CL 121* 124* 116* 108 102 104 113*  CO2 19* 17* 20* 24 28 26 24   GLUCOSE 104* 114* 164* 123* 112* 94 345*  BUN 14 11 12 18  25* 28* 22*  CREATININE 1.76* 1.59* 1.70* 1.78* 1.94* 2.15* 1.97*  CALCIUM 7.5* 7.3* 7.7* 7.5* 7.6* 7.4* 6.8*  MG 1.8 1.8 1.5* 1.8 1.6*  --   --   PHOS 4.0 3.5 2.8 3.3 4.7*  --   --    GFR: Estimated Creatinine Clearance: 23.8 mL/min (A) (by C-G formula based on SCr of 1.97 mg/dL (H)). Liver Function Tests:  Recent Labs Lab 09/13/17 0500  AST 19  ALT 14  ALKPHOS 75  BILITOT 1.2  PROT 5.7*  ALBUMIN 3.2*   No results for input(s): LIPASE, AMYLASE in the last 168 hours. No results for input(s): AMMONIA in the last 168 hours. Coagulation Profile:  Recent Labs Lab 09/17/17 1021  INR 1.24   Cardiac Enzymes: No results for input(s): CKTOTAL, CKMB, CKMBINDEX, TROPONINI in the last 168 hours. BNP (last 3 results) No results for input(s): PROBNP in the last 8760 hours. HbA1C: No results for input(s): HGBA1C in the last 72 hours. CBG:  Recent Labs Lab 09/18/17 1734 09/18/17 1959 09/18/17 2358 09/19/17 0328 09/19/17 0757  GLUCAP 131* 92 90 111* 95   Lipid Profile: No results for input(s): CHOL, HDL, LDLCALC, TRIG, CHOLHDL, LDLDIRECT in the last 72 hours. Thyroid Function Tests: No results for input(s): TSH, T4TOTAL, FREET4, T3FREE, THYROIDAB in the last 72 hours. Anemia Panel: No results for input(s): VITAMINB12, FOLATE, FERRITIN, TIBC, IRON,  RETICCTPCT in the last 72 hours. Sepsis Labs: No results for input(s): PROCALCITON, LATICACIDVEN in the last 168 hours.  Recent Results (from the past 240 hour(s))  Culture, blood (Routine X 2) w Reflex to ID Panel     Status: Abnormal   Collection Time: 09/09/17  2:09 PM  Result Value Ref Range Status   Specimen Description BLOOD RIGHT ANTECUBITAL  Final   Special Requests IN PEDIATRIC BOTTLE Blood Culture adequate volume  Final   Culture  Setup Time   Final    BUDDING YEAST SEEN IN PEDIATRIC BOTTLE CRITICAL RESULT CALLED TO, READ BACK BY AND VERIFIED WITH: N GLOGOVAC,PHARMD AT 1652 09/11/17 BY L BENFIELD Performed at [facility], [place] [address] [place], [place] [address]    Culture CANDIDA GLABRATA (A)  Final   Report Status 09/13/2017 FINAL  Final  Blood Culture ID Panel (Reflexed)     Status: Abnormal   Collection Time: 09/09/17  2:09 PM  Result Value Ref Range Status   Enterococcus species NOT DETECTED NOT DETECTED Final   Listeria monocytogenes NOT DETECTED NOT DETECTED Final   Staphylococcus species NOT DETECTED NOT DETECTED Final   Staphylococcus aureus NOT DETECTED NOT DETECTED Final   Streptococcus species NOT DETECTED NOT DETECTED Final   Streptococcus agalactiae NOT DETECTED NOT DETECTED Final   Streptococcus pneumoniae NOT DETECTED NOT DETECTED Final   Streptococcus pyogenes NOT DETECTED NOT DETECTED Final   Acinetobacter baumannii NOT DETECTED NOT DETECTED Final   Enterobacteriaceae species NOT DETECTED NOT DETECTED Final   Enterobacter cloacae complex NOT DETECTED NOT DETECTED Final   Escherichia coli NOT DETECTED NOT DETECTED Final   Klebsiella oxytoca NOT DETECTED NOT DETECTED Final   Klebsiella pneumoniae NOT DETECTED NOT DETECTED Final   Proteus species NOT DETECTED NOT DETECTED Final   Serratia marcescens NOT DETECTED NOT DETECTED Final   Haemophilus  influenzae NOT DETECTED NOT DETECTED Final   Neisseria meningitidis NOT DETECTED NOT DETECTED Final    Pseudomonas aeruginosa NOT DETECTED NOT DETECTED Final   Candida albicans NOT DETECTED NOT DETECTED Final   Candida glabrata DETECTED (A) NOT DETECTED Final    Comment: CRITICAL RESULT CALLED TO, READ BACK BY AND VERIFIED WITH: N GLOGOVAC,PHARMD AT 1652 09/11/17 BY L BENFIELD    Candida krusei NOT DETECTED NOT DETECTED Final   Candida parapsilosis NOT DETECTED NOT DETECTED Final   Candida tropicalis NOT DETECTED NOT DETECTED Final    Comment: Performed at [facility], [address] [place], [place] [address]  Culture, blood (Routine X 2) w Reflex to ID Panel     Status: Abnormal   Collection Time: 09/09/17  2:34 PM  Result Value Ref Range Status   Specimen Description BLOOD RIGHT HAND  Final   Special Requests IN PEDIATRIC BOTTLE Blood Culture adequate volume  Final   Culture  Setup Time   Final    YEAST IN PEDIATRIC BOTTLE CRITICAL RESULT CALLED TO, READ BACK BY AND VERIFIED WITH: J. GADHIA,PHARMD 1947 09/11/2017 T. TYSOR    Culture CANDIDA GLABRATA (A)  Final   Report Status 09/14/2017 FINAL  Final  Culture, Urine     Status: None   Collection Time: 09/10/17  1:49 PM  Result Value Ref Range Status   Specimen Description URINE, RANDOM  Final   Special Requests NONE  Final   Culture   Final    NO GROWTH Performed at [facility], [place] [address] [place], [place] [address]    Report Status 09/11/2017 FINAL  Final  Culture, blood (routine x 2)     Status: None (Preliminary result)   Collection Time: 09/15/17  8:23 AM  Result Value Ref Range Status   Specimen Description BLOOD LEFT HAND  Final   Special Requests IN PEDIATRIC BOTTLE Blood Culture adequate volume  Final   Culture   Final    NO GROWTH 3 DAYS Performed at [facility], [place] [address], [place], [place] [address]    Report Status PENDING  Incomplete  Culture, blood (routine x 2)     Status: None (Preliminary result)   Collection Time: 09/15/17  8:23 AM  Result Value Ref Range Status   Specimen  Description BLOOD LEFT HAND  Final   Special Requests IN PEDIATRIC BOTTLE Blood Culture adequate volume  Final   Culture   Final    NO GROWTH 3 DAYS Performed at [facility], [place] [address], [place], [place] [address]    Report Status PENDING  Incomplete  C difficile quick scan w PCR reflex     Status: None   Collection Time: 09/16/17  2:15 PM  Result Value Ref Range Status   C Diff antigen NEGATIVE NEGATIVE Final   C Diff toxin NEGATIVE NEGATIVE Final   C Diff interpretation No C. difficile detected.  Final  Aerobic/Anaerobic Culture (surgical/deep wound)     Status: None (Preliminary result)   Collection Time: 09/17/17  7:30 PM  Result Value Ref Range Status   Specimen Description ABSCESS PELVIC  Final   Special Requests NONE  Final   Gram Stain   Final    ABUNDANT WBC PRESENT, PREDOMINANTLY PMN RARE YEAST Performed at [facility], [place] [address] [place], [place] [address]    Culture PENDING  Incomplete   Report Status PENDING  Incomplete     Radiology Studies: Ct  Image Guided Drainage By Percutaneous Catheter  Result Date: 09/18/2017 CLINICAL DATA:  Status post abdominal surgery with development of peritonitis. Worsening leukocytosis with cul de sac pelvic abscess. EXAM: CT GUIDED CATHETER DRAINAGE OF PELVIC PERITONEAL ABSCESS ANESTHESIA/SEDATION: 3.0 mg IV Versed 100 mcg IV Fentanyl Total Moderate Sedation Time:  11 minutes The patient's level of consciousness and physiologic status were continuously monitored during the procedure by Radiology nursing. PROCEDURE: The procedure, risks, benefits, and alternatives were explained to the patient. Questions regarding the procedure were encouraged and answered. The patient understands and consents to the procedure. A time out was performed prior to initiating the procedure. The right transgluteal region was prepped with chlorhexidine in a sterile fashion, and a sterile drape was applied covering the operative field. A  sterile gown and sterile gloves were used for the procedure. Local anesthesia was provided with 1% Lidocaine. The patient was placed in a near decubitus position with the right side up. From a posterior transgluteal approach, an 18 gauge trocar needle was advanced to the level of a pelvic abscess situated posterior to the bladder and anterior to the rectum. After needle entry, aspiration was performed and a fluid sample sent for culture analysis. A guidewire was advanced into the collection. The tract was dilated and a 10 French percutaneous drainage catheter placed. Catheter position was confirmed by CT. The catheter was flushed and connected to a suction bulb. The catheter was secured at the skin with a Prolene retention suture and StatLock device. COMPLICATIONS: None FINDINGS: Aspiration at the level of the pelvic fluid collection yielded bloody and slightly turbid fluid. A sample was sent for culture analysis. After drain placement, there is excellent return of fluid. IMPRESSION: CT-guided percutaneous catheter drainage of pelvic abscess from a posterior right transgluteal approach. There was return of bloody and turbid fluid with a sample sent for culture analysis. A 10 French drain was placed and attached to suction bulb drainage. Electronically Signed   By: Aletta Edouard M.D.   On: 09/18/2017 08:39    Scheduled Meds: . chlorhexidine gluconate (MEDLINE KIT)  15 mL Mouth Rinse BID  . Chlorhexidine Gluconate Cloth  6 each Topical Q0600  . insulin aspart  0-9 Units Subcutaneous Q4H  . mouth rinse  15 mL Mouth Rinse QID  . pantoprazole (PROTONIX) IV  40 mg Intravenous Q24H  . sodium chloride flush  10 mL Intravenous Q8H  . sodium chloride flush  10-40 mL Intracatheter Q12H   Continuous Infusions: . anidulafungin Stopped (09/18/17 1627)  . dextrose 5 % and 0.45% NaCl 10 mL/hr at 09/19/17 0000  . dextrose 5 % and 0.9% NaCl 75 mL/hr at 09/19/17 0000  . feeding supplement (VITAL 1.5 CAL) Stopped  (09/17/17 0900)  . piperacillin-tazobactam (ZOSYN)  IV Stopped (09/19/17 0734)     LOS: 10 days   CHIU, Orpah Melter, MD [facility] Pager [phone]  If 7PM-7AM, please contact night-coverage [email or web address] Password [email or web address] 09/19/2017, 8:32 AM

## 2017-09-19 NOTE — Progress Notes (Signed)
Patient ID: Danielle [name], female   DOB: 1946/03/17, 71 y.o.   MRN: [id number] Patient ID: Danielle [name], female   DOB: 07/22/1946, 71 y.o.   MRN: [id number] 9 Days Post-Op   Subjective: Feels much better this morning. Denies any further nausea. Had a bowel movement.Minimal pain controlled with medications. Her breathing feels better.  Objective: Vital signs in last 24 hours: Temp:  [98.4 F (36.9 C)-99.9 F (37.7 C)] 99.7 F (37.6 C) (10/14 0758) Pulse Rate:  [36-102] 96 (10/14 0600) Resp:  [21-35] 25 (10/14 0600) BP: (88-120)/(43-59) 108/52 (10/14 0600) SpO2:  [93 %-100 %] 100 % (10/14 0600) Weight:  [64.5 kg (142 lb 3.2 oz)] 64.5 kg (142 lb 3.2 oz) (10/14 0500) Last BM Date: 09/18/17  Intake/Output from previous day: 10/13 0701 - 10/14 0700 In: 2073.8 [P.O.:120; I.V.:1283.8; IV Piggyback:360] Out: 2467 [Urine:2450; Drains:17] Intake/Output this shift: No intake/output data recorded.  General appearance: alert, cooperative and no distress Resp: No wheezing ncreased work of  GI: Soft, nontender and nondistended. JP in left upper quadrant and perc drain in left mid abdomen with some serous and cloudy drainage. Incision/Wound: wound inspected, opened with clean granulation  Lab Results:   Recent Labs  09/18/17 0547 09/19/17 0500  WBC 23.3* 19.1*  HGB 10.1* 9.0*  HCT 30.7* 27.6*  PLT 121* 79*   BMET  Recent Labs  09/18/17 0547 09/19/17 0500  NA 145 145  K 4.3 3.4*  CL 104 113*  CO2 26 24  GLUCOSE 94 345*  BUN 28* 22*  CREATININE 2.15* 1.97*  CALCIUM 7.4* 6.8*   Results for orders placed or performed during the hospital encounter of 09/08/17  Culture, blood (Routine X 2) w Reflex to ID Panel     Status: Abnormal   Collection Time: 09/09/17  2:09 PM  Result Value Ref Range Status   Specimen Description BLOOD RIGHT ANTECUBITAL  Final   Special Requests IN PEDIATRIC BOTTLE Blood Culture adequate volume  Final   Culture  Setup Time   Final    BUDDING YEAST  SEEN IN PEDIATRIC BOTTLE CRITICAL RESULT CALLED TO, READ BACK BY AND VERIFIED WITH: N GLOGOVAC,PHARMD AT 1652 09/11/17 BY L BENFIELD Performed at [facility], [place] [address], [place], [place] [address]    Culture CANDIDA GLABRATA (A)  Final   Report Status 09/13/2017 FINAL  Final  Blood Culture ID Panel (Reflexed)     Status: Abnormal   Collection Time: 09/09/17  2:09 PM  Result Value Ref Range Status   Enterococcus species NOT DETECTED NOT DETECTED Final   Listeria monocytogenes NOT DETECTED NOT DETECTED Final   Staphylococcus species NOT DETECTED NOT DETECTED Final   Staphylococcus aureus NOT DETECTED NOT DETECTED Final   Streptococcus species NOT DETECTED NOT DETECTED Final   Streptococcus agalactiae NOT DETECTED NOT DETECTED Final   Streptococcus pneumoniae NOT DETECTED NOT DETECTED Final   Streptococcus pyogenes NOT DETECTED NOT DETECTED Final   Acinetobacter baumannii NOT DETECTED NOT DETECTED Final   Enterobacteriaceae species NOT DETECTED NOT DETECTED Final   Enterobacter cloacae complex NOT DETECTED NOT DETECTED Final   Escherichia coli NOT DETECTED NOT DETECTED Final   Klebsiella oxytoca NOT DETECTED NOT DETECTED Final   Klebsiella pneumoniae NOT DETECTED NOT DETECTED Final   Proteus species NOT DETECTED NOT DETECTED Final   Serratia marcescens NOT DETECTED NOT DETECTED Final   Haemophilus influenzae NOT DETECTED NOT DETECTED Final   Neisseria meningitidis NOT DETECTED NOT DETECTED Final   Pseudomonas aeruginosa NOT  DETECTED NOT DETECTED Final   Candida albicans NOT DETECTED NOT DETECTED Final   Candida glabrata DETECTED (A) NOT DETECTED Final    Comment: CRITICAL RESULT CALLED TO, READ BACK BY AND VERIFIED WITH: N GLOGOVAC,PHARMD AT 1652 09/11/17 BY L BENFIELD    Candida krusei NOT DETECTED NOT DETECTED Final   Candida parapsilosis NOT DETECTED NOT DETECTED Final   Candida tropicalis NOT DETECTED NOT DETECTED Final    Comment: Performed at [facility],  [address], [place], [place] [address]  Culture, blood (Routine X 2) w Reflex to ID Panel     Status: Abnormal   Collection Time: 09/09/17  2:34 PM  Result Value Ref Range Status   Specimen Description BLOOD RIGHT HAND  Final   Special Requests IN PEDIATRIC BOTTLE Blood Culture adequate volume  Final   Culture  Setup Time   Final    YEAST IN PEDIATRIC BOTTLE CRITICAL RESULT CALLED TO, READ BACK BY AND VERIFIED WITH: J. GADHIA,PHARMD 1946 09/11/2017 T. TYSOR    Culture CANDIDA GLABRATA (A)  Final   Report Status 09/14/2017 FINAL  Final  Culture, Urine     Status: None   Collection Time: 09/10/17  1:49 PM  Result Value Ref Range Status   Specimen Description URINE, RANDOM  Final   Special Requests NONE  Final   Culture   Final    NO GROWTH Performed at [facility], [place] [address], [place], [place] [address]    Report Status 09/11/2017 FINAL  Final  Culture, blood (routine x 2)     Status: None (Preliminary result)   Collection Time: 09/15/17  8:23 AM  Result Value Ref Range Status   Specimen Description BLOOD LEFT HAND  Final   Special Requests IN PEDIATRIC BOTTLE Blood Culture adequate volume  Final   Culture   Final    NO GROWTH 3 DAYS Performed at [facility], [place] [address] [place], [place] [address]    Report Status PENDING  Incomplete  Culture, blood (routine x 2)     Status: None (Preliminary result)   Collection Time: 09/15/17  8:23 AM  Result Value Ref Range Status   Specimen Description BLOOD LEFT HAND  Final   Special Requests IN PEDIATRIC BOTTLE Blood Culture adequate volume  Final   Culture   Final    NO GROWTH 3 DAYS Performed at [facility], [place] [address] [place], [place] [address]    Report Status PENDING  Incomplete  C difficile quick scan w PCR reflex     Status: None   Collection Time: 09/16/17  2:15 PM  Result Value Ref Range Status   C Diff antigen NEGATIVE NEGATIVE Final   C Diff toxin NEGATIVE NEGATIVE Final   C Diff  interpretation No C. difficile detected.  Final  Aerobic/Anaerobic Culture (surgical/deep wound)     Status: None (Preliminary result)   Collection Time: 09/17/17  7:30 PM  Result Value Ref Range Status   Specimen Description ABSCESS PELVIC  Final   Special Requests NONE  Final   Gram Stain   Final    ABUNDANT WBC PRESENT, PREDOMINANTLY PMN RARE YEAST Performed at [facility], [place] [address] [place], [place] [address]    Culture PENDING  Incomplete   Report Status PENDING  Incomplete     Studies/Results: Ct Image Guided Drainage By Percutaneous Catheter  Result Date: 09/18/2017 CLINICAL DATA:  Status post abdominal surgery with development of peritonitis.  Worsening leukocytosis with cul de sac pelvic abscess. EXAM: CT GUIDED CATHETER DRAINAGE OF PELVIC PERITONEAL ABSCESS ANESTHESIA/SEDATION: 3.0 mg IV Versed 100 mcg IV Fentanyl Total Moderate Sedation Time:  11 minutes The patient's level of consciousness and physiologic status were continuously monitored during the procedure by Radiology nursing. PROCEDURE: The procedure, risks, benefits, and alternatives were explained to the patient. Questions regarding the procedure were encouraged and answered. The patient understands and consents to the procedure. A time out was performed prior to initiating the procedure. The right transgluteal region was prepped with chlorhexidine in a sterile fashion, and a sterile drape was applied covering the operative field. A sterile gown and sterile gloves were used for the procedure. Local anesthesia was provided with 1% Lidocaine. The patient was placed in a near decubitus position with the right side up. From a posterior transgluteal approach, an 18 gauge trocar needle was advanced to the level of a pelvic abscess situated posterior to the bladder and anterior to the rectum. After needle entry, aspiration was performed and a fluid sample sent for culture analysis. A guidewire was advanced into the  collection. The tract was dilated and a 10 French percutaneous drainage catheter placed. Catheter position was confirmed by CT. The catheter was flushed and connected to a suction bulb. The catheter was secured at the skin with a Prolene retention suture and StatLock device. COMPLICATIONS: None FINDINGS: Aspiration at the level of the pelvic fluid collection yielded bloody and slightly turbid fluid. A sample was sent for culture analysis. After drain placement, there is excellent return of fluid. IMPRESSION: CT-guided percutaneous catheter drainage of pelvic abscess from a posterior right transgluteal approach. There was return of bloody and turbid fluid with a sample sent for culture analysis. A 10 French drain was placed and attached to suction bulb drainage. Electronically Signed   By: Aletta Edouard M.D.   On: 09/18/2017 08:39    Anti-infectives: Anti-infectives    Start     Dose/Rate Route Frequency Ordered Stop   09/13/17 1200  piperacillin-tazobactam (ZOSYN) IVPB 3.375 g     3.375 g 12.5 mL/hr over 240 Minutes Intravenous Every 8 hours 09/13/17 0732     09/12/17 1500  anidulafungin (ERAXIS) 100 mg in sodium chloride 0.9 % 100 mL IVPB  Status:  Discontinued     100 mg 78 mL/hr over 100 Minutes Intravenous Every 24 hours 09/11/17 1448 09/11/17 1500   09/11/17 1500  anidulafungin (ERAXIS) 200 mg in sodium chloride 0.9 % 200 mL IVPB  Status:  Discontinued     200 mg 78 mL/hr over 200 Minutes Intravenous  Once 09/11/17 1448 09/11/17 1500   09/11/17 1400  anidulafungin (ERAXIS) 100 mg in sodium chloride 0.9 % 100 mL IVPB     100 mg 78 mL/hr over 100 Minutes Intravenous Every 24 hours 09/10/17 1246     09/11/17 0000  piperacillin-tazobactam (ZOSYN) IVPB 2.25 g  Status:  Discontinued     2.25 g 100 mL/hr over 30 Minutes Intravenous Every 6 hours 09/10/17 1410 09/13/17 0732   09/10/17 1430  linezolid (ZYVOX) IVPB 600 mg  Status:  Discontinued     600 mg 300 mL/hr over 60 Minutes Intravenous  Every 12 hours 09/10/17 1414 09/16/17 1356   09/10/17 1400  anidulafungin (ERAXIS) 200 mg in sodium chloride 0.9 % 200 mL IVPB     200 mg 78 mL/hr over 200 Minutes Intravenous  Once 09/10/17 1246 09/10/17 1651   09/10/17 1400  linezolid (ZYVOX) IVPB 600 mg  Status:  Discontinued     600 mg 300 mL/hr over 60 Minutes Intravenous Every 12 hours 09/10/17 1344 09/10/17 1414   09/09/17 1400  vancomycin (VANCOCIN) IVPB 1000 mg/200 mL premix  Status:  Discontinued     1,000 mg 200 mL/hr over 60 Minutes Intravenous Every 24 hours 09/09/17 1346 09/10/17 1344   09/09/17 1345  piperacillin-tazobactam (ZOSYN) IVPB 3.375 g  Status:  Discontinued     3.375 g 12.5 mL/hr over 240 Minutes Intravenous Every 8 hours 09/09/17 1344 09/10/17 1410   09/08/17 1000  vancomycin (VANCOCIN) IVPB 1000 mg/200 mL premix     1,000 mg 200 mL/hr over 60 Minutes Intravenous  Once 09/08/17 0946 09/08/17 1100   09/08/17 0950  ceFAZolin (ANCEF) 2-4 GM/100ML-% IVPB    Comments:  Bridget Hartshorn   : cabinet override      09/08/17 0950 09/08/17 1040   09/08/17 0947  vancomycin (VANCOCIN) 1-5 GM/200ML-% IVPB  Status:  Discontinued    Comments:  Bridget Hartshorn   : cabinet override      09/08/17 0947 09/08/17 0956   09/08/17 0941  vancomycin (VANCOCIN) 1-5 GM/200ML-% IVPB    Comments:  Dellie Catholic   : cabinet override      09/08/17 0941 09/08/17 1000   09/08/17 0810  ceFAZolin (ANCEF) IVPB 2g/100 mL premix     2 g 200 mL/hr over 30 Minutes Intravenous On call to O.R. 09/08/17 0811 09/08/17 1050      Assessment/Plan: s/p Procedure(s): Laparoscopic reduction of hiatal hernia and placement of G-tube  Exploratory laparotomy with takedown of G tube, repair gastrostomy abdominal washout  Postoperative respiratory failure, improved.  Postoperative pelvic abscess status post percutaneous drainage yesterday. Colitis by CT scan with C. difficile negative (patientwith history of Crohn's colitis)  NG out without nausea and has  bowel function  ID: On Zosyn, Zyvox and  Eraxis with blood and fluid cultures positive for Candida Glabrata  FEN: start clear liquid diet and we can gradually advance as tolerated   LOS: 10 days    Anaily Ashbaugh T 09/19/2017

## 2017-09-19 NOTE — Progress Notes (Signed)
Pharmacy Antibiotic Note  KRISTINIA LEAVY is a 71 y.o. female admitted on 09/08/2017 with paraesophageal hiatal hernia s/p laparoscopic G-tube placement and gastropexy, LOA. Post-op ex lap reveals frank pus in abdomen. Patient's currently on broad abx for peritonitis and fungemia.  Today, 09/19/2017: - Day #11 zosyn, vanco/linezolid,  Day #10 anidulafungin - afebrile - WBC better s/p IR drain placement 10/12 - scr relative stable at ~2 mg/dl (CrCl > 20 ml/min) - plts improved with stopping linezolid (although down again this am) - LFTs wnl   Plan: - Continue zosyn to 3.375 gm IV q8h (infuse over 4 hrs) - continue Eraxis 100 mg IV q24h per MD - monitor renal function closely - pelvic abscess culture from 10/12 pending __________________________  Height: 5' 4.5" (163.8 cm) Weight: 142 lb 3.2 oz (64.5 kg) IBW/kg (Calculated) : 55.85  Temp (24hrs), Avg:99.5 F (37.5 C), Min:98.4 F (36.9 C), Max:99.9 F (37.7 C)   Recent Labs Lab 09/15/17 0400 09/16/17 0545 09/17/17 0500 09/18/17 0547 09/19/17 0500  WBC 16.6* 20.8* 22.3* 23.3* 19.1*  CREATININE 1.70* 1.78* 1.94* 2.15* 1.97*    Estimated Creatinine Clearance: 23.8 mL/min (A) (by C-G formula based on SCr of 1.97 mg/dL (H)).    Allergies  Allergen Reactions  . Zithromax [Azithromycin] Swelling and Other (See Comments)    Reaction:  Mouth swelling and blisters   . Aspirin Other (See Comments)    Reaction:  Stomach cramping   . Ibuprofen Other (See Comments)    Pt states that she prefers not to take this medication.   . Iron Nausea And Vomiting    FERROUS SULFATE  . Sulfa Antibiotics Nausea And Vomiting    Antimicrobials this admission: 10/3 Cefazolin x1 dose preop 10/3 Vanc x1 preop, then daily starting 10/4 >> 10/5 10/4 Zosyn >>  10/5 Zyvox >> 10/11 10/5 Eraxis >>  Dose adjustments this admission:   Microbiology results: 10/4 BCx: 2/2 bottles w/ C glabrata 10/5 MRSA PCR: pos 10/5 UCx: NGF 10/10 BCx:  NGTD 10/11 C. diff: neg/neg 10/12: pelvic abscess: rare yeast  Thank you for allowing pharmacy to be a part of this patient's care.  Doreene Eland, PharmD, BCPS.   Pager: [phone] 09/19/2017 10:15 AM

## 2017-09-20 DIAGNOSIS — L02211 Cutaneous abscess of abdominal wall: Secondary | ICD-10-CM

## 2017-09-20 DIAGNOSIS — B377 Candidal sepsis: Secondary | ICD-10-CM

## 2017-09-20 LAB — GLUCOSE, CAPILLARY
GLUCOSE-CAPILLARY: 143 mg/dL — AB (ref 65–99)
GLUCOSE-CAPILLARY: 78 mg/dL (ref 65–99)
Glucose-Capillary: 82 mg/dL (ref 65–99)
Glucose-Capillary: 89 mg/dL (ref 65–99)
Glucose-Capillary: 93 mg/dL (ref 65–99)
Glucose-Capillary: 102 mg/dL — ABNORMAL HIGH (ref 65–99)
Glucose-Capillary: 96 mg/dL (ref 65–99)
Glucose-Capillary: 83 mg/dL (ref 65–99)

## 2017-09-20 LAB — CULTURE, BLOOD (ROUTINE X 2)
CULTURE: NO GROWTH
CULTURE: NO GROWTH
SPECIAL REQUESTS: ADEQUATE
Special Requests: ADEQUATE

## 2017-09-20 LAB — BASIC METABOLIC PANEL
Anion gap: 11 (ref 5–15)
BUN: 17 mg/dL (ref 6–20)
CHLORIDE: 110 mmol/L (ref 101–111)
CO2: 19 mmol/L — ABNORMAL LOW (ref 22–32)
Calcium: 7.6 mg/dL — ABNORMAL LOW (ref 8.9–10.3)
Creatinine, Ser: 1.76 mg/dL — ABNORMAL HIGH (ref 0.44–1.00)
GFR calc Af Amer: 33 mL/min — ABNORMAL LOW (ref >60)
GFR calc non Af Amer: 28 mL/min — ABNORMAL LOW (ref >60)
GLUCOSE: 115 mg/dL — AB (ref 65–99)
POTASSIUM: 3.2 mmol/L — AB (ref 3.5–5.1)
Sodium: 140 mmol/L (ref 135–145)

## 2017-09-20 LAB — CBC WITH DIFFERENTIAL/PLATELET
Basophils Absolute: 0 10*3/uL (ref 0.0–0.1)
Basophils Relative: 0 %
EOS PCT: 1 %
Eosinophils Absolute: 0.2 10*3/uL (ref 0.0–0.7)
HCT: 27.8 % — ABNORMAL LOW (ref 36.0–46.0)
HEMOGLOBIN: 9.2 g/dL — AB (ref 12.0–15.0)
LYMPHS ABS: 5 10*3/uL — AB (ref 0.7–4.0)
LYMPHS PCT: 24 %
MCH: 29 pg (ref 26.0–34.0)
MCHC: 33.1 g/dL (ref 30.0–36.0)
MCV: 87.7 fL (ref 78.0–100.0)
MONOS PCT: 16 %
Monocytes Absolute: 3.3 10*3/uL — ABNORMAL HIGH (ref 0.1–1.0)
Neutro Abs: 12.3 10*3/uL — ABNORMAL HIGH (ref 1.7–7.7)
Neutrophils Relative %: 59 %
PLATELETS: 72 10*3/uL — AB (ref 150–400)
RBC: 3.17 MIL/uL — AB (ref 3.87–5.11)
RDW: 15.6 % — ABNORMAL HIGH (ref 11.5–15.5)
WBC: 20.9 10*3/uL — AB (ref 4.0–10.5)

## 2017-09-20 MED ORDER — BOOST / RESOURCE BREEZE PO LIQD
1.0000 | Freq: Three times a day (TID) | ORAL | Status: DC
  Administered 2017-09-20 – 2017-09-24 (×8): 1 via ORAL

## 2017-09-20 MED ORDER — POTASSIUM CHLORIDE CRYS ER 20 MEQ PO TBCR
40.0000 meq | EXTENDED_RELEASE_TABLET | Freq: Once | ORAL | Status: AC
  Administered 2017-09-20: 40 meq via ORAL
  Filled 2017-09-20: qty 2

## 2017-09-20 MED ORDER — MEGESTROL ACETATE 400 MG/10ML PO SUSP
400.0000 mg | Freq: Every day | ORAL | Status: DC
  Administered 2017-09-20 – 2017-09-26 (×7): 400 mg via ORAL
  Filled 2017-09-20 (×7): qty 10

## 2017-09-20 MED ORDER — ALUM & MAG HYDROXIDE-SIMETH 200-200-20 MG/5ML PO SUSP
15.0000 mL | ORAL | Status: DC | PRN
  Administered 2017-09-20: 15 mL via ORAL
  Filled 2017-09-20: qty 30

## 2017-09-20 MED ORDER — FAMOTIDINE 20 MG PO TABS
20.0000 mg | ORAL_TABLET | Freq: Once | ORAL | Status: AC
  Administered 2017-09-20: 20 mg via ORAL
  Filled 2017-09-20: qty 1

## 2017-09-20 NOTE — Progress Notes (Signed)
10 Days Post-Op   Subjective/Chief Complaint: Pt with NAE overnight "Ready to go home." Some n/v overnight, not with meals  Objective: Vital signs in last 24 hours: Temp:  [98.5 F (36.9 C)-100.7 F (38.2 C)] 98.5 F (36.9 C) (10/15 0332) Pulse Rate:  [51-99] 93 (10/15 0500) Resp:  [20-32] 29 (10/15 0500) BP: (97-119)/(42-61) 97/47 (10/15 0500) SpO2:  [92 %-100 %] 100 % (10/15 0500) Weight:  [64 kg (141 lb 1.5 oz)] 64 kg (141 lb 1.5 oz) (10/15 0500) Last BM Date: 09/19/17  Intake/Output from previous day: 10/14 0701 - 10/15 0700 In: 1915 [P.O.:60; I.V.:1625; IV Piggyback:230] Out: 1410 [Urine:800; Drains:10; Stool:600] Intake/Output this shift: No intake/output data recorded.  General appearance: alert and cooperative Cardio: regular rate and rhythm, S1, S2 normal, no murmur, click, rub or gallop GI: soft, approp ttp, ND, active BS, incision c/d/i, Jp SS  Lab Results:   Recent Labs  09/19/17 0500 09/20/17 0331  WBC 19.1* 20.9*  HGB 9.0* 9.2*  HCT 27.6* 27.8*  PLT 79* 72*   BMET  Recent Labs  09/19/17 0500 09/20/17 0331  NA 145 140  K 3.4* 3.2*  CL 113* 110  CO2 24 19*  GLUCOSE 345* 115*  BUN 22* 17  CREATININE 1.97* 1.76*  CALCIUM 6.8* 7.6*   PT/INR  Recent Labs  09/17/17 1021  LABPROT 15.5*  INR 1.24   ABG No results for input(s): PHART, HCO3 in the last 72 hours.  Invalid input(s): PCO2, PO2  Studies/Results: No results found.  Anti-infectives: Anti-infectives    Start     Dose/Rate Route Frequency Ordered Stop   09/13/17 1200  piperacillin-tazobactam (ZOSYN) IVPB 3.375 g     3.375 g 12.5 mL/hr over 240 Minutes Intravenous Every 8 hours 09/13/17 0732     09/12/17 1500  anidulafungin (ERAXIS) 100 mg in sodium chloride 0.9 % 100 mL IVPB  Status:  Discontinued     100 mg 78 mL/hr over 100 Minutes Intravenous Every 24 hours 09/11/17 1448 09/11/17 1500   09/11/17 1500  anidulafungin (ERAXIS) 200 mg in sodium chloride 0.9 % 200 mL IVPB   Status:  Discontinued     200 mg 78 mL/hr over 200 Minutes Intravenous  Once 09/11/17 1448 09/11/17 1500   09/11/17 1400  anidulafungin (ERAXIS) 100 mg in sodium chloride 0.9 % 100 mL IVPB     100 mg 78 mL/hr over 100 Minutes Intravenous Every 24 hours 09/10/17 1246     09/11/17 0000  piperacillin-tazobactam (ZOSYN) IVPB 2.25 g  Status:  Discontinued     2.25 g 100 mL/hr over 30 Minutes Intravenous Every 6 hours 09/10/17 1410 09/13/17 0732   09/10/17 1430  linezolid (ZYVOX) IVPB 600 mg  Status:  Discontinued     600 mg 300 mL/hr over 60 Minutes Intravenous Every 12 hours 09/10/17 1414 09/16/17 1356   09/10/17 1400  anidulafungin (ERAXIS) 200 mg in sodium chloride 0.9 % 200 mL IVPB     200 mg 78 mL/hr over 200 Minutes Intravenous  Once 09/10/17 1246 09/10/17 1651   09/10/17 1400  linezolid (ZYVOX) IVPB 600 mg  Status:  Discontinued     600 mg 300 mL/hr over 60 Minutes Intravenous Every 12 hours 09/10/17 1344 09/10/17 1414   09/09/17 1400  vancomycin (VANCOCIN) IVPB 1000 mg/200 mL premix  Status:  Discontinued     1,000 mg 200 mL/hr over 60 Minutes Intravenous Every 24 hours 09/09/17 1346 09/10/17 1344   09/09/17 1345  piperacillin-tazobactam (ZOSYN) IVPB 3.375 g  Status:  Discontinued     3.375 g 12.5 mL/hr over 240 Minutes Intravenous Every 8 hours 09/09/17 1344 09/10/17 1410   09/08/17 1000  vancomycin (VANCOCIN) IVPB 1000 mg/200 mL premix     1,000 mg 200 mL/hr over 60 Minutes Intravenous  Once 09/08/17 0946 09/08/17 1100   09/08/17 0950  ceFAZolin (ANCEF) 2-4 GM/100ML-% IVPB    Comments:  Bridget Hartshorn   : cabinet override      09/08/17 0950 09/08/17 1040   09/08/17 0947  vancomycin (VANCOCIN) 1-5 GM/200ML-% IVPB  Status:  Discontinued    Comments:  Bridget Hartshorn   : cabinet override      09/08/17 0947 09/08/17 0956   09/08/17 0941  vancomycin (VANCOCIN) 1-5 GM/200ML-% IVPB    Comments:  Dellie Catholic   : cabinet override      09/08/17 0941 09/08/17 1000   09/08/17 0810   ceFAZolin (ANCEF) IVPB 2g/100 mL premix     2 g 200 mL/hr over 30 Minutes Intravenous On call to O.R. 09/08/17 0811 09/08/17 1050      Assessment/Plan: s/p Procedure(s): Exploratory laparotomy with takedown of G tube, repair gastrostomy abdominal washout (N/A) Con't abx as per ID.  Pelvic fluid micro pending OOBTC PT Adv to soft diet and OK for boost shakes.  Hopefully to floor in next 1-2d   LOS: 11 days    [name]., [facility] 09/20/2017

## 2017-09-20 NOTE — Progress Notes (Addendum)
[facility] for Infectious Disease   Reason for visit: Follow up on abdominal abscess  Interval History: Leukocytosis is stable, Tmax 100.7, no complaints, wants to go home.  Wants JP out.  No associated rash, n/v/d.   Day 13 antibiotics Day 11 anidulafungin Day 11 pip/tazo  Physical Exam: Constitutional:  Vitals:   09/20/17 1000 09/20/17 1100  BP: (!) 105/46 (!) 105/44  Pulse: 87 87  Resp: (!) 30 (!) 24  Temp:    SpO2: 100% 100%   patient appears in NAD, more alert Respiratory: Normal respiratory effort; CTA B Cardiovascular: RRR GI: soft, nt Skin: no rashes  Review of Systems: Constitutional: negative for chills and fatigue Respiratory: negative for cough  Lab Results  Component Value Date   WBC 20.9 (H) 09/20/2017   HGB 9.2 (L) 09/20/2017   HCT 27.8 (L) 09/20/2017   MCV 87.7 09/20/2017   PLT 72 (L) 09/20/2017    Lab Results  Component Value Date   CREATININE 1.76 (H) 09/20/2017   BUN 17 09/20/2017   NA 140 09/20/2017   K 3.2 (L) 09/20/2017   CL 110 09/20/2017   CO2 19 (L) 09/20/2017    Lab Results  Component Value Date   ALT 14 09/13/2017   AST 19 09/13/2017   ALKPHOS 75 09/13/2017     Microbiology: Recent Results (from the past 240 hour(s))  Culture, Urine     Status: None   Collection Time: 09/10/17  1:49 PM  Result Value Ref Range Status   Specimen Description URINE, RANDOM  Final   Special Requests NONE  Final   Culture   Final    NO GROWTH Performed at [facility], [address], [place], [place] [address]    Report Status 09/11/2017 FINAL  Final  Culture, blood (routine x 2)     Status: None (Preliminary result)   Collection Time: 09/15/17  8:23 AM  Result Value Ref Range Status   Specimen Description BLOOD LEFT HAND  Final   Special Requests IN PEDIATRIC BOTTLE Blood Culture adequate volume  Final   Culture   Final    NO GROWTH 4 DAYS Performed at [facility], [place] [address] [place], [place] [address]    Report Status PENDING  Incomplete  Culture, blood (routine x 2)     Status: None (Preliminary result)   Collection Time: 09/15/17  8:23 AM  Result Value Ref Range Status   Specimen Description BLOOD LEFT HAND  Final   Special Requests IN PEDIATRIC BOTTLE Blood Culture adequate volume  Final   Culture   Final    NO GROWTH 4 DAYS Performed at [facility], [place] [address] [place], [place] [address]    Report Status PENDING  Incomplete  C difficile quick scan w PCR reflex     Status: None   Collection Time: 09/16/17  2:15 PM  Result Value Ref Range Status   C Diff antigen NEGATIVE NEGATIVE Final   C Diff toxin NEGATIVE NEGATIVE Final   C Diff interpretation No C. difficile detected.  Final  Aerobic/Anaerobic Culture (surgical/deep wound)     Status: None (Preliminary result)   Collection Time: 09/17/17  7:30 PM  Result Value Ref Range Status   Specimen Description ABSCESS PELVIC  Final   Special Requests NONE  Final   Gram Stain   Final    ABUNDANT WBC PRESENT, PREDOMINANTLY PMN RARE YEAST    Culture   Final  CULTURE REINCUBATED FOR BETTER GROWTH Performed at [facility], [place] [address] [place], [place] [address]    Report Status PENDING  Incomplete    Impression/Plan:  1. Abdominal abscess - drained by IR and culture reincubated.  Gram stain with yeast.  Continue anidulafungin and pip/tazo, follow culture. 2. Leukocytosis - stable.  Likely from #1.   3.  C glabrata fungemia - repeat cultures ngtd.  Likely from #1.  On anidulafungin.  Sensitivities pending.    Dr. Johnnye Sima on tomorrow

## 2017-09-20 NOTE — Progress Notes (Signed)
PROGRESS NOTE    Danielle [name]  ZOX:[id number] DOB: 05-31-1946 DOA: 09/08/2017 PCP: Iona Beard, MD    Brief Narrative:  [phone] who presented for surgical correction of hiatal hernia. Pt developed septic shock post-operatively with purulent peritonitis. Initially followed by CCM. Hospitalist now following for medical management  Assessment & Plan:   Principal Problem:   Respiratory failure, post-operative ([place]) Active Problems:   Hypomagnesemia   Acute renal failure superimposed on stage 3 chronic kidney disease ([place])   Hiatal hernia   Immunosuppressed status ([place])   Rectovaginal fistula   Gastrostomy tube in place [facility])   History of repair of hiatal hernia   Peritonitis (HCC)   Fungemia   Encephalopathy acute   Anasarca   Electrolyte imbalance  1. Acute hypoxic resp failure, post-operatively 1. Patient had been followed by CCM 2. Currently on minimal O2 support 3. Normal respiratory effort on exam 2. Acute metabolic encephalopathy 1. Improved 2. Remains alert and oriented 3. Anasarca 1. Suspect secondary to recent voluem resuscitation 2. S/p aggressive diuresis with IV lasix 3. Edema has improved significantly 4. Lasix now stopped secondary to elevated Cr. 4. Acute on CKD3 1. Cr rising 2. Have stopped IV lasix 3. Patient continued on gentle IVF 4. Cr is improving 5. Will recheck bmet in AM 5. Hypomagnesemia 1. Continue to replace as needed 6. Hypokalemia 1. Corrected this AM 2. Repeat bmet in AM 7. Peritonitis 1. Cont per CCM 2. Earlier on linezolid with zosyn. Linezolid had been stopped secondary to decreasing platelets 3. Continue abx per ID recs 8. Fungemia 1. Per ID, continue anidulafungin for now 2. Pelvic abscess has grown rare yeast 9. Intra-abdominal fluid collection 1. Per IR, 8.7cm culdesac collection s/p drain 2. Culture growing rare yeast 3. Per IR, if WBC continues to rise, then possible f/u CT   DVT prophylaxis: SCD's Code Status:  Full Family Communication: Pt in room, family not in room Disposition Plan: Uncertain at this time  Consultants:     Procedures:     Antimicrobials: Anti-infectives    Start     Dose/Rate Route Frequency Ordered Stop   09/13/17 1200  piperacillin-tazobactam (ZOSYN) IVPB 3.375 g     3.375 g 12.5 mL/hr over 240 Minutes Intravenous Every 8 hours 09/13/17 0732     09/12/17 1500  anidulafungin (ERAXIS) 100 mg in sodium chloride 0.9 % 100 mL IVPB  Status:  Discontinued     100 mg 78 mL/hr over 100 Minutes Intravenous Every 24 hours 09/11/17 1448 09/11/17 1500   09/11/17 1500  anidulafungin (ERAXIS) 200 mg in sodium chloride 0.9 % 200 mL IVPB  Status:  Discontinued     200 mg 78 mL/hr over 200 Minutes Intravenous  Once 09/11/17 1448 09/11/17 1500   09/11/17 1400  anidulafungin (ERAXIS) 100 mg in sodium chloride 0.9 % 100 mL IVPB     100 mg 78 mL/hr over 100 Minutes Intravenous Every 24 hours 09/10/17 1246     09/11/17 0000  piperacillin-tazobactam (ZOSYN) IVPB 2.25 g  Status:  Discontinued     2.25 g 100 mL/hr over 30 Minutes Intravenous Every 6 hours 09/10/17 1410 09/13/17 0732   09/10/17 1430  linezolid (ZYVOX) IVPB 600 mg  Status:  Discontinued     600 mg 300 mL/hr over 60 Minutes Intravenous Every 12 hours 09/10/17 1414 09/16/17 1356   09/10/17 1400  anidulafungin (ERAXIS) 200 mg in sodium chloride 0.9 % 200 mL IVPB     200 mg 78 mL/hr over 200  Minutes Intravenous  Once 09/10/17 1246 09/10/17 1651   09/10/17 1400  linezolid (ZYVOX) IVPB 600 mg  Status:  Discontinued     600 mg 300 mL/hr over 60 Minutes Intravenous Every 12 hours 09/10/17 1344 09/10/17 1414   09/09/17 1400  vancomycin (VANCOCIN) IVPB 1000 mg/200 mL premix  Status:  Discontinued     1,000 mg 200 mL/hr over 60 Minutes Intravenous Every 24 hours 09/09/17 1346 09/10/17 1344   09/09/17 1345  piperacillin-tazobactam (ZOSYN) IVPB 3.375 g  Status:  Discontinued     3.375 g 12.5 mL/hr over 240 Minutes Intravenous Every  8 hours 09/09/17 1344 09/10/17 1410   09/08/17 1000  vancomycin (VANCOCIN) IVPB 1000 mg/200 mL premix     1,000 mg 200 mL/hr over 60 Minutes Intravenous  Once 09/08/17 0946 09/08/17 1100   09/08/17 0950  ceFAZolin (ANCEF) 2-4 GM/100ML-% IVPB    Comments:  Bridget Hartshorn   : cabinet override      09/08/17 0950 09/08/17 1040   09/08/17 0947  vancomycin (VANCOCIN) 1-5 GM/200ML-% IVPB  Status:  Discontinued    Comments:  Bridget Hartshorn   : cabinet override      09/08/17 0947 09/08/17 0956   09/08/17 0941  vancomycin (VANCOCIN) 1-5 GM/200ML-% IVPB    Comments:  [place], Ron   : cabinet override      09/08/17 0941 09/08/17 1000   09/08/17 0810  ceFAZolin (ANCEF) IVPB 2g/100 mL premix     2 g 200 mL/hr over 30 Minutes Intravenous On call to O.R. 09/08/17 0811 09/08/17 1050      Subjective: Reports feeling better today  Objective: Vitals:   09/20/17 1000 09/20/17 1100 09/20/17 1200 09/20/17 1345  BP: (!) 105/46 (!) 105/44    Pulse: 87 87  84  Resp: (!) 30 (!) 24  (!) 23  Temp:   97.7 F (36.5 C)   TempSrc:   Oral   SpO2: 100% 100%  100%  Weight:      Height:        Intake/Output Summary (Last 24 hours) at 09/20/17 1512 Last data filed at 09/20/17 1200  Gross per 24 hour  Intake             2680 ml  Output             1400 ml  Net             1280 ml   Filed Weights   09/18/17 0435 09/19/17 0500 09/20/17 0500  Weight: 60.4 kg (133 lb 2.5 oz) 64.5 kg (142 lb 3.2 oz) 64 kg (141 lb 1.5 oz)    Examination: General exam: Conversant, in no acute distress Respiratory system: normal chest rise, clear, no audible wheezing Cardiovascular system: regular rhythm, s1-s2 Gastrointestinal system: Nondistended, nontender, pos BS Central nervous system: No seizures, no tremors Extremities: No cyanosis, no joint deformities Skin: No rashes, no pallor Psychiatry: Affect normal // no auditory hallucinations   Data Reviewed: I have personally reviewed following labs and imaging  studies  CBC:  Recent Labs Lab 09/16/17 0545 09/17/17 0500 09/18/17 0547 09/19/17 0500 09/20/17 0331  WBC 20.8* 22.3* 23.3* 19.1* 20.9*  NEUTROABS 14.1* 15.7* 14.3* 11.8* 12.3*  HGB 10.5* 10.2* 10.1* 9.0* 9.2*  HCT 31.2* 30.5* 30.7* 27.6* 27.8*  MCV 86.0 86.6 85.5 87.1 87.7  PLT 51* 36* 121* 79* 72*   Basic Metabolic Panel:  Recent Labs Lab 09/14/17 0347 09/15/17 0400 09/16/17 0545 09/17/17 0500 09/18/17 0547 09/19/17 0500 09/20/17 0331  NA 150* 146* 145 144 145 145 140  K 3.2* 2.9* 2.9* 3.5 4.3 3.4* 3.2*  CL 124* 116* 108 102 104 113* 110  CO2 17* 20* _0 19*  GLUCOSE 114* 164* 123* 112* 94 345* 115*  BUN _1 25* 28* 22* 17  CREATININE 1.59* 1.70* 1.78* 1.94* 2.15* 1.97* 1.76*  CALCIUM 7.3* 7.7* 7.5* 7.6* 7.4* 6.8* 7.6*  MG 1.8 1.5* 1.8 1.6*  --   --   --   PHOS 3.5 2.8 3.3 4.7*  --   --   --    GFR: Estimated Creatinine Clearance: 26.6 mL/min (A) (by C-G formula based on SCr of 1.76 mg/dL (H)). Liver Function Tests: No results for input(s): AST, ALT, ALKPHOS, BILITOT, PROT, ALBUMIN in the last 168 hours. No results for input(s): LIPASE, AMYLASE in the last 168 hours. No results for input(s): AMMONIA in the last 168 hours. Coagulation Profile:  Recent Labs Lab 09/17/17 1021  INR 1.24   Cardiac Enzymes: No results for input(s): CKTOTAL, CKMB, CKMBINDEX, TROPONINI in the last 168 hours. BNP (last 3 results) No results for input(s): PROBNP in the last 8760 hours. HbA1C: No results for input(s): HGBA1C in the last 72 hours. CBG:  Recent Labs Lab 09/19/17 1954 09/19/17 2323 09/20/17 0305 09/20/17 0811 09/20/17 1158  GLUCAP 82 89 143* 93 102*   Lipid Profile: No results for input(s): CHOL, HDL, LDLCALC, TRIG, CHOLHDL, LDLDIRECT in the last 72 hours. Thyroid Function Tests: No results for input(s): TSH, T4TOTAL, FREET4, T3FREE, THYROIDAB in the last 72 hours. Anemia Panel: No results for input(s): VITAMINB12, FOLATE, FERRITIN, TIBC,  IRON, RETICCTPCT in the last 72 hours. Sepsis Labs: No results for input(s): PROCALCITON, LATICACIDVEN in the last 168 hours.  Recent Results (from the past 240 hour(s))  Culture, blood (routine x 2)     Status: None   Collection Time: 09/15/17  8:23 AM  Result Value Ref Range Status   Specimen Description BLOOD LEFT HAND  Final   Special Requests IN PEDIATRIC BOTTLE Blood Culture adequate volume  Final   Culture   Final    NO GROWTH 5 DAYS Performed at [facility], [address] [place], [place] [address]    Report Status 09/20/2017 FINAL  Final  Culture, blood (routine x 2)     Status: None   Collection Time: 09/15/17  8:23 AM  Result Value Ref Range Status   Specimen Description BLOOD LEFT HAND  Final   Special Requests IN PEDIATRIC BOTTLE Blood Culture adequate volume  Final   Culture   Final    NO GROWTH 5 DAYS Performed at [facility], [place] [address], [place], [place] [address]    Report Status 09/20/2017 FINAL  Final  C difficile quick scan w PCR reflex     Status: None   Collection Time: 09/16/17  2:15 PM  Result Value Ref Range Status   C Diff antigen NEGATIVE NEGATIVE Final   C Diff toxin NEGATIVE NEGATIVE Final   C Diff interpretation No C. difficile detected.  Final  Aerobic/Anaerobic Culture (surgical/deep wound)     Status: None (Preliminary result)   Collection Time: 09/17/17  7:30 PM  Result Value Ref Range Status   Specimen Description ABSCESS PELVIC  Final   Special Requests NONE  Final   Gram Stain   Final    ABUNDANT WBC PRESENT, PREDOMINANTLY PMN RARE YEAST    Culture   Final    MODERATE  YEAST CULTURE REINCUBATED FOR BETTER GROWTH Performed at [facility], [place] [address], [place], [place] [address]    Report Status PENDING  Incomplete     Radiology Studies: No results found.  Scheduled Meds: . chlorhexidine gluconate (MEDLINE KIT)  15 mL Mouth Rinse BID  . Chlorhexidine Gluconate Cloth  6 each Topical Q0600  . feeding  supplement  1 Container Oral TID BM  . insulin aspart  0-9 Units Subcutaneous Q4H  . mouth rinse  15 mL Mouth Rinse QID  . megestrol  400 mg Oral Daily  . pantoprazole (PROTONIX) IV  40 mg Intravenous Q24H  . sodium chloride flush  10 mL Intravenous Q8H  . sodium chloride flush  10-40 mL Intracatheter Q12H   Continuous Infusions: . anidulafungin 100 mg (09/20/17 1458)  . dextrose 5 % and 0.45% NaCl 10 mL/hr at 09/19/17 0000  . dextrose 5 % and 0.9% NaCl 75 mL/hr at 09/19/17 0000  . feeding supplement (VITAL 1.5 CAL) Stopped (09/17/17 0900)  . piperacillin-tazobactam (ZOSYN)  IV 3.375 g (09/20/17 1243)     LOS: 11 days   Sheryn Aldaz, Orpah Melter, MD [facility] Pager [phone]  If 7PM-7AM, please contact night-coverage [email or web address] Password [email or web address] 09/20/2017, 3:12 PM

## 2017-09-20 NOTE — Progress Notes (Signed)
Referring Physician(s): Ramirez,A  Supervising Physician: Sandi Mariscal  Patient Status:  [facility] - In-pt  Chief Complaint: Pelvic abscess   Subjective: Pt states she is feeling better this afternoon. She does continue to have some intermittent epigastric fullness; had some nausea and vomiting earlier today but currently stable.   Allergies: Zithromax [azithromycin]; Aspirin; Ibuprofen; Iron; and Sulfa antibiotics  Medications: Prior to Admission medications   Medication Sig Start Date End Date Taking? Authorizing Provider  albuterol (PROVENTIL HFA;VENTOLIN HFA) 108 (90 Base) MCG/ACT inhaler Inhale 2 puffs into the lungs every 6 (six) hours as needed for wheezing. 12/09/15  Yes Rama, Venetia Maxon, MD  capsicum oleoresin (TRIXAICIN) 0.025 % cream Apply topically 3 (three) times daily. Patient taking differently: Apply 1 application topically 3 (three) times daily as needed (abscess).  06/15/17  Yes Nita Sells, MD  cyanocobalamin (,VITAMIN B-12,) 1000 MCG/ML injection INJECT 1000 MCG IM ONCE MONTHLY 07/09/17  Yes Rehman, Mechele Dawley, MD  dicyclomine (BENTYL) 10 MG/5ML syrup TAKE 10 MLS (20 MG TOTAL) BY MOUTH 3 (THREE) TIMES DAILY AS NEEDED. Patient taking differently: Take 20 mg by mouth 3 (three) times daily as needed (stomach cramps).  07/26/17  Yes Rehman, Mechele Dawley, MD  diphenoxylate-atropine (LOMOTIL) 2.5-0.025 MG tablet TAKE 1 TABLET BY MOUTH FOUR TIMES A DAY AS NEEDED Patient taking differently: Take one tablet twice daily. 06/28/17  Yes Setzer, Rona Ravens, NP  furosemide (LASIX) 20 MG tablet Take 1 tablet (20 mg total) by mouth daily as needed (Take for weight gain of more than 2 pounds in a day or 5 pounds in 2 days.). 06/16/17  Yes Lavina Hamman, MD  HYDROcodone-acetaminophen (NORCO) 7.5-325 MG tablet Take 1 tablet by mouth every 12 (twelve) hours as needed for moderate pain.   Yes [provider]  nystatin (NYSTATIN) powder Apply 1 g topically 2 (two) times daily as  needed (for itching/irritation).   Yes [provider]  pantoprazole (PROTONIX) 40 MG tablet Take 1 tablet (40 mg total) by mouth daily. 06/16/17  Yes Nita Sells, MD  Simethicone (PHAZYME PO) Take 1 tablet by mouth daily as needed (gas).    Yes [provider]  sodium bicarbonate 650 MG tablet TAKE 1 TABLET (650 MG TOTAL) BY MOUTH 2 (TWO) TIMES DAILY. 05/14/17  Yes [provider]  spironolactone (ALDACTONE) 50 MG tablet Take 50 mg by mouth daily.  06/23/17  Yes [provider]  oxyCODONE (OXY IR/ROXICODONE) 5 MG immediate release tablet Take 1-2 tablets (5-10 mg total) by mouth every 4 (four) hours as needed for moderate pain. 09/09/17   Ralene Ok, MD     Vital Signs: BP (!) 105/44   Pulse 84   Temp 97.7 F (36.5 C) (Oral)   Resp (!) 23   Ht 5' 4.5" (1.638 m)   Wt 141 lb 1.5 oz (64 kg)   SpO2 100%   BMI 23.84 kg/m   Physical Exam awake/alert; TG drain intact, output 10-15 cc blood-tinged fluid; cx pend; site not sig tender  Imaging: Ct Abdomen Pelvis W Contrast  Result Date: 09/16/2017 CLINICAL DATA:  admitted for surgical intervention of hiatal hernia. She had laparoscopic gastropexy, lysis of adhesions, and G tube placement. Developed septic shock and VDRF post op from pneumoperitoneum with purulent peritonitis with concern for G tube leak.HX crohns, hiatal hernia, bowel resection, appendectomy, cholecystectomy EXAM: CT ABDOMEN AND PELVIS WITH CONTRAST TECHNIQUE: Multidetector CT imaging of the abdomen and pelvis was performed using the standard protocol following bolus administration  of intravenous contrast. CONTRAST:  13mL ISOVUE-300 IOPAMIDOL (ISOVUE-300) INJECTION 61% COMPARISON:  09/09/2017 FINDINGS: Lower chest: Moderate bilateral pleural effusions. Extensive atelectasis/ consolidation in the dependent aspect of both lower lobes. Right subclavian central line to the cavoatrial junction. Large hiatal hernia containing gastric fundus  and a large amount of mesenteric fat, with surrounding fluid which may be ascites or hemorrhage. Nasogastric tube in place. Two surgical drains from the abdomen extend into the hiatal hernia region. Hepatobiliary: Liver unremarkable. Cholecystectomy clips. 4.4 x2.5 cm fluid collection at inferior aspect of the falciform ligament, without surrounding enhancement. No intraparenchymal liver lesion. No biliary ductal dilatation. Pancreas: Unremarkable. No pancreatic ductal dilatation or surrounding inflammatory changes. Spleen: Normal in size without focal abnormality. Adrenals/Urinary Tract: Normal adrenals. Kidneys unremarkable. Foley catheter decompresses the urinary bladder. Stomach/Bowel: Interval removal of gastrostomy tube. Stomach is decompressed, the fundus extending into the hiatal hernia as above. Proximal small bowel decompressed. There is some distended fluid-filled loops of distal small bowel long-term including terminal ileum showing circumferential mild wall thickening. There is contiguous circumferential wall thickening in the proximal ascending colon, also segmentally in the splenic flexure of the colon. There is moderate inflammatory/edematous change around the splenic flexure without abscess. Colon is nondilated. Rectal tube in place. Vascular/Lymphatic: No significant vascular findings are present. No enlarged abdominal or pelvic lymph nodes. Reproductive: Status post hysterectomy. No adnexal masses. Other: Loculated peripherally enhancing fluid collection in the cul-de-sac measured approximately 8.7 x 2.1 cm maximum transverse dimensions. There is scattered fluid among leaves of the mesentery. No free air. Musculoskeletal: Stable mild L1 compression deformity. No acute fracture or worrisome bone lesion. 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 IMPRESSION: 1. Complex postop fluid collection in the large hiatal hernia may represent ascites and edematous mesentery versus hemorrhage. 2. 8.7 cm  peripherally enhancing cul-de-sac fluid collection, possible abscess. 3. Circumferential wall thickening in distal ileum, proximal ascending colon, and splenic flexure with surrounding inflammatory/edematous changes consistent with colitis. 4. Increase in bilateral pleural effusions. Electronically Signed   By: Lucrezia Europe M.D.   On: 09/16/2017 19:36   Ct Image Guided Drainage By Percutaneous Catheter  Result Date: 09/18/2017 CLINICAL DATA:  Status post abdominal surgery with development of peritonitis. Worsening leukocytosis with cul de sac pelvic abscess. EXAM: CT GUIDED CATHETER DRAINAGE OF PELVIC PERITONEAL ABSCESS ANESTHESIA/SEDATION: 3.0 mg IV Versed 100 mcg IV Fentanyl Total Moderate Sedation Time:  11 minutes The patient's level of consciousness and physiologic status were continuously monitored during the procedure by Radiology nursing. PROCEDURE: The procedure, risks, benefits, and alternatives were explained to the patient. Questions regarding the procedure were encouraged and answered. The patient understands and consents to the procedure. A time out was performed prior to initiating the procedure. The right transgluteal region was prepped with chlorhexidine in a sterile fashion, and a sterile drape was applied covering the operative field. A sterile gown and sterile gloves were used for the procedure. Local anesthesia was provided with 1% Lidocaine. The patient was placed in a near decubitus position with the right side up. From a posterior transgluteal approach, an 18 gauge trocar needle was advanced to the level of a pelvic abscess situated posterior to the bladder and anterior to the rectum. After needle entry, aspiration was performed and a fluid sample sent for culture analysis. A guidewire was advanced into the collection. The tract was dilated and a 10 French percutaneous drainage catheter placed. Catheter position was confirmed by CT. The catheter was flushed and connected to a suction bulb.  The catheter was secured at the skin with a Prolene retention suture and StatLock device. COMPLICATIONS: None FINDINGS: Aspiration at the level of the pelvic fluid collection yielded bloody and slightly turbid fluid. A sample was sent for culture analysis. After drain placement, there is excellent return of fluid. IMPRESSION: CT-guided percutaneous catheter drainage of pelvic abscess from a posterior right transgluteal approach. There was return of bloody and turbid fluid with a sample sent for culture analysis. A 10 French drain was placed and attached to suction bulb drainage. Electronically Signed   By: Aletta Edouard M.D.   On: 09/18/2017 08:39    Labs:  CBC:  Recent Labs  09/17/17 0500 09/18/17 0547 09/19/17 0500 09/20/17 0331  WBC 22.3* 23.3* 19.1* 20.9*  HGB 10.2* 10.1* 9.0* 9.2*  HCT 30.5* 30.7* 27.6* 27.8*  PLT 36* 121* 79* 72*    COAGS:  Recent Labs  05/06/17 2133 05/07/17 0244 09/17/17 1021  INR 1.18  --  1.24  APTT  --  21*  --     BMP:  Recent Labs  09/17/17 0500 09/18/17 0547 09/19/17 0500 09/20/17 0331  NA 144 145 145 140  K 3.5 4.3 3.4* 3.2*  CL 102 104 113* 110  CO2 28 26 24  19*  GLUCOSE 112* 94 345* 115*  BUN 25* 28* 22* 17  CALCIUM 7.6* 7.4* 6.8* 7.6*  CREATININE 1.94* 2.15* 1.97* 1.76*  GFRNONAA 25* 22* 25* 28*  GFRAA 29* 26* 29* 33*    LIVER FUNCTION TESTS:  Recent Labs  07/28/17 0500 09/10/17 0851 09/11/17 0509 09/13/17 0500  BILITOT 0.4 0.6 0.7 1.2  AST 21 48* 45* 19  ALT 15 21 29 14   ALKPHOS 102 91 93 75  PROT 7.0 5.9* 4.4* 5.7*  ALBUMIN 2.8* 2.4* 1.8* 3.2*    Assessment and Plan: S/p drainage of pelvic cul de sac abscess 10/12; afebrile; WBC 20.9(19.1), hgb 9.2, plts 72k, K 3.2, creat 1.76(1.97), drain fluid cx pend; cont drain irrigation; monitor labs-if WBC cont to increase check f/u CT, otherwise obtain scan within 1 week of placement to reassess adequacy of drainage   Electronically Signed: D. Rowe Robert,  PA-C 09/20/2017, 1:57 PM   I spent a total of 15 minutes at the the patient's bedside AND on the patient's hospital floor or unit, greater than 50% of which was counseling/coordinating care for pelvic abscess drain    Patient ID: Danielle [name], female   DOB: 1946/01/17, 71 y.o.   MRN: [id number]

## 2017-09-20 NOTE — Care Management Note (Signed)
Case Management Note  Patient Details  Name: Danielle [name] MRN: [id number] Date of Birth: [birth year]  Subjective/Objective:                  abd surg, post op,advancing diet to soft.  Action/Plan: Date:  October 15,2 018 Chart reviewed for concurrent status and case management needs.  Will continue to follow patient progress.  Discharge Planning: following for needs  Expected discharge date: [id number]  Velva Harman, BSN, [place], [place]   Expected Discharge Date:                  Expected Discharge Plan:  Home/Self Care  In-House Referral:     Discharge planning Services  CM Consult  Post Acute Care Choice:    Choice offered to:     DME Arranged:    DME Agency:     HH Arranged:    HH Agency:     Status of Service:  In process, will continue to follow  If discussed at Long Length of Stay Meetings, dates discussed:    Additional Comments:  Leeroy Cha, RN 09/20/2017, 8:54 AM

## 2017-09-20 NOTE — Progress Notes (Signed)
Physical Therapy Treatment Patient Details Name: Danielle [name] MRN: [id number] DOB: 1946-01-28 Today's Date: 09/20/2017    History of Present Illness 71 yo female s/p Gtube placement, gastropexy, hernia repair 09/08/17; emergent exp lap, hepatorrhapy, mediastinal drains, gastrotomy 10/5; peritonitis, septic shock, VDRF-extubated 10/6, hypotension, hypokalemia. Hx of hiatal hernia, Crohn's disease.     PT Comments    Pt refused OOB/transfers/ambulation on today despite encouragement. Pt c/o nausea. She did agree to sit EOB for a few minutes. She refused to sit up in the recliner. Explained to pt the importance of mobilizing a little more each day. Pt stated " I know. I've had this surgery before. I don't feel like it today."     Follow Up Recommendations  SNF (pt refusing)     Equipment Recommendations   (continuing to assess-may need RW)    Recommendations for Other Services       Precautions / Restrictions Precautions Precautions: Fall Precaution Comments: 3 jp drains Restrictions Weight Bearing Restrictions: No    Mobility  Bed Mobility Overal bed mobility: Needs Assistance Bed Mobility: Supine to Sit;Sit to Supine Rolling: Min assist;+2 for safety/equipment     Sit to supine: Min assist;+2 for safety/equipment   General bed mobility comments: Assist for trunk and LEs. Increased time. Pt relied on bedrail  Transfers Overall transfer level: Needs assistance Equipment used: Rolling walker (2 wheeled) Transfers: Sit to/from Stand           General transfer comment: Pt refused.   Ambulation/Gait             General Gait Details: Pt refused   Stairs            Wheelchair Mobility    Modified Rankin (Stroke Patients Only)       Balance           Standing balance support: Bilateral upper extremity supported Standing balance-Leahy Scale: Poor                              Cognition Arousal/Alertness: Awake/alert Behavior  During Therapy: WFL for tasks assessed/performed Overall Cognitive Status: Within Functional Limits for tasks assessed                                        Exercises      General Comments        Pertinent Vitals/Pain Pain Assessment: Faces Faces Pain Scale: Hurts little more Pain Location: back, abdomen Pain Descriptors / Indicators: Grimacing;Sore Pain Intervention(s): Limited activity within patient's tolerance;Repositioned    Home Living                      Prior Function            PT Goals (current goals can now be found in the care plan section) Progress towards PT goals: Progressing toward goals    Frequency    Min 3X/week      PT Plan Current plan remains appropriate    Co-evaluation              AM-PAC PT "6 Clicks" Daily Activity  Outcome Measure  Difficulty turning over in bed (including adjusting bedclothes, sheets and blankets)?: Unable Difficulty moving from lying on back to sitting on the side of the bed? : Unable Difficulty sitting down on and standing up from  a chair with arms (e.g., wheelchair, bedside commode, etc,.)?: Unable Help needed moving to and from a bed to chair (including a wheelchair)?: A Little Help needed walking in hospital room?: A Little Help needed climbing 3-5 steps with a railing? : A Lot 6 Click Score: 11    End of Session   Activity Tolerance: Patient limited by fatigue (Limited by nausea per pt) Patient left: in bed;with call bell/phone within reach   PT Visit Diagnosis: Muscle weakness (generalized) (M62.81);Difficulty in walking, not elsewhere classified (R26.2)     Time: 0370-9643 PT Time Calculation (min) (ACUTE ONLY): 24 min  Charges:  $Therapeutic Activity: 23-37 mins                    G Codes:         Weston Anna, MPT Pager: [phone]

## 2017-09-21 DIAGNOSIS — Z9889 Other specified postprocedural states: Secondary | ICD-10-CM

## 2017-09-21 DIAGNOSIS — E46 Unspecified protein-calorie malnutrition: Secondary | ICD-10-CM

## 2017-09-21 DIAGNOSIS — Z8719 Personal history of other diseases of the digestive system: Secondary | ICD-10-CM

## 2017-09-21 LAB — CREATININE, SERUM
CREATININE: 1.45 mg/dL — AB (ref 0.44–1.00)
GFR calc Af Amer: 42 mL/min — ABNORMAL LOW (ref >60)
GFR, EST NON AFRICAN AMERICAN: 36 mL/min — AB (ref >60)

## 2017-09-21 LAB — CBC WITH DIFFERENTIAL/PLATELET
BASOS PCT: 0 %
Basophils Absolute: 0 10*3/uL (ref 0.0–0.1)
EOS PCT: 1 %
Eosinophils Absolute: 0.3 10*3/uL (ref 0.0–0.7)
HEMATOCRIT: 27.2 % — AB (ref 36.0–46.0)
Hemoglobin: 8.7 g/dL — ABNORMAL LOW (ref 12.0–15.0)
Lymphocytes Relative: 26 %
Lymphs Abs: 4.8 10*3/uL — ABNORMAL HIGH (ref 0.7–4.0)
MCH: 28.4 pg (ref 26.0–34.0)
MCHC: 32 g/dL (ref 30.0–36.0)
MCV: 88.9 fL (ref 78.0–100.0)
MONO ABS: 2.8 10*3/uL — AB (ref 0.1–1.0)
MONOS PCT: 16 %
NEUTROS ABS: 10.2 10*3/uL — AB (ref 1.7–7.7)
Neutrophils Relative %: 57 %
Platelets: 90 10*3/uL — ABNORMAL LOW (ref 150–400)
RBC: 3.06 MIL/uL — ABNORMAL LOW (ref 3.87–5.11)
RDW: 15.6 % — AB (ref 11.5–15.5)
WBC: 18.1 10*3/uL — ABNORMAL HIGH (ref 4.0–10.5)

## 2017-09-21 LAB — BASIC METABOLIC PANEL
Anion gap: 8 (ref 5–15)
BUN: 11 mg/dL (ref 6–20)
CO2: 18 mmol/L — AB (ref 22–32)
CREATININE: 1.4 mg/dL — AB (ref 0.44–1.00)
Calcium: 7.7 mg/dL — ABNORMAL LOW (ref 8.9–10.3)
Chloride: 116 mmol/L — ABNORMAL HIGH (ref 101–111)
GFR calc non Af Amer: 37 mL/min — ABNORMAL LOW (ref >60)
GFR, EST AFRICAN AMERICAN: 43 mL/min — AB (ref >60)
Glucose, Bld: 90 mg/dL (ref 65–99)
Potassium: 3.1 mmol/L — ABNORMAL LOW (ref 3.5–5.1)
Sodium: 142 mmol/L (ref 135–145)

## 2017-09-21 LAB — GLUCOSE, CAPILLARY
GLUCOSE-CAPILLARY: 100 mg/dL — AB (ref 65–99)
GLUCOSE-CAPILLARY: 105 mg/dL — AB (ref 65–99)
GLUCOSE-CAPILLARY: 118 mg/dL — AB (ref 65–99)
GLUCOSE-CAPILLARY: 81 mg/dL (ref 65–99)
Glucose-Capillary: 112 mg/dL — ABNORMAL HIGH (ref 65–99)
Glucose-Capillary: 59 mg/dL — ABNORMAL LOW (ref 65–99)
Glucose-Capillary: 71 mg/dL (ref 65–99)
Glucose-Capillary: 68 mg/dL (ref 65–99)

## 2017-09-21 LAB — MAGNESIUM: MAGNESIUM: 1.6 mg/dL — AB (ref 1.7–2.4)

## 2017-09-21 MED ORDER — MAGNESIUM SULFATE 2 GM/50ML IV SOLN
2.0000 g | Freq: Once | INTRAVENOUS | Status: AC
  Administered 2017-09-21: 2 g via INTRAVENOUS
  Filled 2017-09-21: qty 50

## 2017-09-21 MED ORDER — DEXTROSE 50 % IV SOLN
25.0000 mL | Freq: Once | INTRAVENOUS | Status: AC
  Administered 2017-09-21: 25 mL via INTRAVENOUS

## 2017-09-21 MED ORDER — POTASSIUM CHLORIDE CRYS ER 20 MEQ PO TBCR
40.0000 meq | EXTENDED_RELEASE_TABLET | Freq: Two times a day (BID) | ORAL | Status: AC
  Administered 2017-09-21 – 2017-09-22 (×2): 40 meq via ORAL
  Filled 2017-09-21 (×2): qty 2

## 2017-09-21 MED ORDER — DEXTROSE 50 % IV SOLN
INTRAVENOUS | Status: AC
  Filled 2017-09-21: qty 50

## 2017-09-21 NOTE — Progress Notes (Signed)
PROGRESS NOTE    DNIYAH [name]  OEH:[id number] DOB: 03/02/1946 DOA: 09/08/2017 PCP: Iona Beard, MD    Brief Narrative:  [phone] who presented for surgical correction of hiatal hernia. Pt developed septic shock post-operatively with purulent peritonitis. Initially followed by CCM. Hospitalist now following for medical management  Assessment & Plan:   Principal Problem:   Respiratory failure, post-operative ([place]) Active Problems:   Hypomagnesemia   Acute renal failure superimposed on stage 3 chronic kidney disease ([place])   Hiatal hernia   Immunosuppressed status ([place])   Rectovaginal fistula   Gastrostomy tube in place [facility])   History of repair of hiatal hernia   Peritonitis (H[facility])   Fungemia   Encephalopathy acute   Anasarca   Electrolyte imbalance  1. Acute hypoxic resp failure, post-operatively 1. Patient previously followed by CCM 2. Currently on minimal O2 support 3. Normal respiratory effort on exam 2. Acute metabolic encephalopathy 1. Now much improved 2. Remains alert and oriented 3. Anasarca 1. Suspect secondary to recent voluem resuscitation 2. S/p aggressive diuresis with IV lasix 3. Edema has improved significantly 4. Lasix was stopped stopped secondary to elevated Cr. 4. Acute on CKD3 1. Cr peaked at over 2 2. Have stopped IV lasix 3. Patient continued on gentle IVF 4. Cr continuing to improve 5. Repeat bmet in AM 5. Hypomagnesemia 1. 1.6 this AM 2. Will correct. Repeat level in AM 6. Hypokalemia 1. Will correct 2. Repeat bmet in AM 7. Peritonitis 1. Cont per CCM 2. Earlier had been on linezolid with zosyn. Linezolid had been stopped secondary to decreasing platelets 3. Patient is continued on abx per ID recs 8. Fungemia 1. Per ID, continue anidulafungin for now 2. Pelvic abscess has grown rare yeast 9. Intra-abdominal fluid collection 1. Per IR, 8.7cm culdesac collection s/p drain 2. Culture growing rare yeast 3. IR recs for repeat CT later  this week  DVT prophylaxis: SCD's Code Status: Full Family Communication: Pt in room, family not in room Disposition Plan: Uncertain at this time  Consultants:     Procedures:     Antimicrobials: Anti-infectives    Start     Dose/Rate Route Frequency Ordered Stop   09/13/17 1200  piperacillin-tazobactam (ZOSYN) IVPB 3.375 g     3.375 g 12.5 mL/hr over 240 Minutes Intravenous Every 8 hours 09/13/17 0732     09/12/17 1500  anidulafungin (ERAXIS) 100 mg in sodium chloride 0.9 % 100 mL IVPB  Status:  Discontinued     100 mg 78 mL/hr over 100 Minutes Intravenous Every 24 hours 09/11/17 1448 09/11/17 1500   09/11/17 1500  anidulafungin (ERAXIS) 200 mg in sodium chloride 0.9 % 200 mL IVPB  Status:  Discontinued     200 mg 78 mL/hr over 200 Minutes Intravenous  Once 09/11/17 1448 09/11/17 1500   09/11/17 1400  anidulafungin (ERAXIS) 100 mg in sodium chloride 0.9 % 100 mL IVPB     100 mg 78 mL/hr over 100 Minutes Intravenous Every 24 hours 09/10/17 1246     09/11/17 0000  piperacillin-tazobactam (ZOSYN) IVPB 2.25 g  Status:  Discontinued     2.25 g 100 mL/hr over 30 Minutes Intravenous Every 6 hours 09/10/17 1410 09/13/17 0732   09/10/17 1430  linezolid (ZYVOX) IVPB 600 mg  Status:  Discontinued     600 mg 300 mL/hr over 60 Minutes Intravenous Every 12 hours 09/10/17 1414 09/16/17 1356   09/10/17 1400  anidulafungin (ERAXIS) 200 mg in sodium chloride 0.9 % 200 mL IVPB  200 mg 78 mL/hr over 200 Minutes Intravenous  Once 09/10/17 1246 09/10/17 1651   09/10/17 1400  linezolid (ZYVOX) IVPB 600 mg  Status:  Discontinued     600 mg 300 mL/hr over 60 Minutes Intravenous Every 12 hours 09/10/17 1344 09/10/17 1414   09/09/17 1400  vancomycin (VANCOCIN) IVPB 1000 mg/200 mL premix  Status:  Discontinued     1,000 mg 200 mL/hr over 60 Minutes Intravenous Every 24 hours 09/09/17 1346 09/10/17 1344   09/09/17 1345  piperacillin-tazobactam (ZOSYN) IVPB 3.375 g  Status:  Discontinued      3.375 g 12.5 mL/hr over 240 Minutes Intravenous Every 8 hours 09/09/17 1344 09/10/17 1410   09/08/17 1000  vancomycin (VANCOCIN) IVPB 1000 mg/200 mL premix     1,000 mg 200 mL/hr over 60 Minutes Intravenous  Once 09/08/17 0946 09/08/17 1100   09/08/17 0950  ceFAZolin (ANCEF) 2-4 GM/100ML-% IVPB    Comments:  Bridget Hartshorn   : cabinet override      09/08/17 0950 09/08/17 1040   09/08/17 0947  vancomycin (VANCOCIN) 1-5 GM/200ML-% IVPB  Status:  Discontinued    Comments:  Bridget Hartshorn   : cabinet override      09/08/17 0947 09/08/17 0956   09/08/17 0941  vancomycin (VANCOCIN) 1-5 GM/200ML-% IVPB    Comments:  [place] Ron   : cabinet override      09/08/17 0941 09/08/17 1000   09/08/17 0810  ceFAZolin (ANCEF) IVPB 2g/100 mL premix     2 g 200 mL/hr over 30 Minutes Intravenous On call to O.R. 09/08/17 0811 09/08/17 1050      Subjective: Without complaints  Objective: Vitals:   09/21/17 0900 09/21/17 1000 09/21/17 1100 09/21/17 1200  BP: (!) 107/48 110/61 (!) 95/55 104/61  Pulse: 87 86 85 (!) 34  Resp: (!) 25 20 (!) 36 20  Temp:      TempSrc:      SpO2: 100% 100% 100% 100%  Weight:      Height:        Intake/Output Summary (Last 24 hours) at 09/21/17 1343 Last data filed at 09/21/17 1000  Gross per 24 hour  Intake              555 ml  Output             1090 ml  Net             -535 ml   Filed Weights   09/19/17 0500 09/20/17 0500 09/21/17 0500  Weight: 64.5 kg (142 lb 3.2 oz) 64 kg (141 lb 1.5 oz) 62 kg (136 lb 11 oz)    Examination: General exam: Awake, laying in bed, in nad Respiratory system: Normal respiratory effort, no wheezing Cardiovascular system: regular rate, s1, s2 Gastrointestinal system: Soft, nondistended, positive BS Central nervous system: CN2-12 grossly intact, strength intact Extremities: Perfused, no clubbing Skin: Normal skin turgor, no notable skin lesions seen Psychiatry: Mood normal // no visual hallucinations   Data Reviewed: I have  personally reviewed following labs and imaging studies  CBC:  Recent Labs Lab 09/17/17 0500 09/18/17 0547 09/19/17 0500 09/20/17 0331 09/21/17 0500  WBC 22.3* 23.3* 19.1* 20.9* 18.1*  NEUTROABS 15.7* 14.3* 11.8* 12.3* 10.2*  HGB 10.2* 10.1* 9.0* 9.2* 8.7*  HCT 30.5* 30.7* 27.6* 27.8* 27.2*  MCV 86.6 85.5 87.1 87.7 88.9  PLT 36* 121* 79* 72* 90*   Basic Metabolic Panel:  Recent Labs Lab 09/15/17 0400 09/16/17 0545 09/17/17 0500 09/18/17 0547 09/19/17  0500 09/20/17 0331 09/21/17 0500 09/21/17 0800  NA 146* 145 144 145 145 140  --  142  K 2.9* 2.9* 3.5 4.3 3.4* 3.2*  --  3.1*  CL 116* 108 102 104 113* 110  --  116*  CO2 20* 24 28 26 24  19*  --  18*  GLUCOSE 164* 123* 112* 94 345* 115*  --  90  BUN 12 18 25* 28* 22* 17  --  11  CREATININE 1.70* 1.78* 1.94* 2.15* 1.97* 1.76* 1.45* 1.40*  CALCIUM 7.7* 7.5* 7.6* 7.4* 6.8* 7.6*  --  7.7*  MG 1.5* 1.8 1.6*  --   --   --   --  1.6*  PHOS 2.8 3.3 4.7*  --   --   --   --   --    GFR: Estimated Creatinine Clearance: 33.5 mL/min (A) (by C-G formula based on SCr of 1.4 mg/dL (H)). Liver Function Tests: No results for input(s): AST, ALT, ALKPHOS, BILITOT, PROT, ALBUMIN in the last 168 hours. No results for input(s): LIPASE, AMYLASE in the last 168 hours. No results for input(s): AMMONIA in the last 168 hours. Coagulation Profile:  Recent Labs Lab 09/17/17 1021  INR 1.24   Cardiac Enzymes: No results for input(s): CKTOTAL, CKMB, CKMBINDEX, TROPONINI in the last 168 hours. BNP (last 3 results) No results for input(s): PROBNP in the last 8760 hours. HbA1C: No results for input(s): HGBA1C in the last 72 hours. CBG:  Recent Labs Lab 09/20/17 2052 09/20/17 2314 09/21/17 0319 09/21/17 0802 09/21/17 1216  GLUCAP 78 83 112* 105* 100*   Lipid Profile: No results for input(s): CHOL, HDL, LDLCALC, TRIG, CHOLHDL, LDLDIRECT in the last 72 hours. Thyroid Function Tests: No results for input(s): TSH, T4TOTAL, FREET4, T3FREE,  THYROIDAB in the last 72 hours. Anemia Panel: No results for input(s): VITAMINB12, FOLATE, FERRITIN, TIBC, IRON, RETICCTPCT in the last 72 hours. Sepsis Labs: No results for input(s): PROCALCITON, LATICACIDVEN in the last 168 hours.  Recent Results (from the past 240 hour(s))  Culture, blood (routine x 2)     Status: None   Collection Time: 09/15/17  8:23 AM  Result Value Ref Range Status   Specimen Description BLOOD LEFT HAND  Final   Special Requests IN PEDIATRIC BOTTLE Blood Culture adequate volume  Final   Culture   Final    NO GROWTH 5 DAYS Performed at [facility], [address], [place], [place] [address]    Report Status 09/20/2017 FINAL  Final  Culture, blood (routine x 2)     Status: None   Collection Time: 09/15/17  8:23 AM  Result Value Ref Range Status   Specimen Description BLOOD LEFT HAND  Final   Special Requests IN PEDIATRIC BOTTLE Blood Culture adequate volume  Final   Culture   Final    NO GROWTH 5 DAYS Performed at [facility], [place] [address] [place], [place] [address]    Report Status 09/20/2017 FINAL  Final  C difficile quick scan w PCR reflex     Status: None   Collection Time: 09/16/17  2:15 PM  Result Value Ref Range Status   C Diff antigen NEGATIVE NEGATIVE Final   C Diff toxin NEGATIVE NEGATIVE Final   C Diff interpretation No C. difficile detected.  Final  Aerobic/Anaerobic Culture (surgical/deep wound)     Status: None (Preliminary result)   Collection Time: 09/17/17  7:30 PM  Result Value Ref Range Status   Specimen Description ABSCESS  PELVIC  Final   Special Requests NONE  Final   Gram Stain   Final    ABUNDANT WBC PRESENT, PREDOMINANTLY PMN RARE YEAST Performed at [facility], [place] [address] [place],  [address]    Culture   Final    MODERATE YEAST CULTURE REINCUBATED FOR BETTER GROWTH NO ANAEROBES ISOLATED; CULTURE IN PROGRESS FOR 5 DAYS    Report Status PENDING  Incomplete     Radiology Studies: No  results found.  Scheduled Meds: . chlorhexidine gluconate (MEDLINE KIT)  15 mL Mouth Rinse BID  . Chlorhexidine Gluconate Cloth  6 each Topical Q0600  . feeding supplement  1 Container Oral TID BM  . insulin aspart  0-9 Units Subcutaneous Q4H  . mouth rinse  15 mL Mouth Rinse QID  . megestrol  400 mg Oral Daily  . pantoprazole (PROTONIX) IV  40 mg Intravenous Q24H  . potassium chloride  40 mEq Oral BID  . sodium chloride flush  10 mL Intravenous Q8H  . sodium chloride flush  10-40 mL Intracatheter Q12H   Continuous Infusions: . anidulafungin Stopped (09/20/17 1638)  . dextrose 5 % and 0.9% NaCl 75 mL/hr at 09/21/17 1000  . feeding supplement (VITAL 1.5 CAL) Stopped (09/17/17 0900)  . piperacillin-tazobactam (ZOSYN)  IV 3.375 g (09/21/17 1205)     LOS: 12 days   CHIU, Orpah Melter, MD [facility] Pager [phone]  If 7PM-7AM, please contact night-coverage [email or web address] Password [email or web address] 09/21/2017, 1:43 PM

## 2017-09-21 NOTE — Progress Notes (Signed)
Referring Physician(s): Ramirez,A  Supervising Physician: Jacqulynn Cadet  Patient Status:  [facility] - In-pt  Chief Complaint:  Pelvic abscess  Subjective: Pt on portable commode; still with loose nonbloody stools; denies n/v; still has intermittent epigastric discomfort   Allergies: Zithromax [azithromycin]; Aspirin; Ibuprofen; Iron; and Sulfa antibiotics  Medications: Prior to Admission medications   Medication Sig Start Date End Date Taking? Authorizing Provider  albuterol (PROVENTIL HFA;VENTOLIN HFA) 108 (90 Base) MCG/ACT inhaler Inhale 2 puffs into the lungs every 6 (six) hours as needed for wheezing. 12/09/15  Yes Rama, Venetia Maxon, MD  capsicum oleoresin (TRIXAICIN) 0.025 % cream Apply topically 3 (three) times daily. Patient taking differently: Apply 1 application topically 3 (three) times daily as needed (abscess).  06/15/17  Yes Nita Sells, MD  cyanocobalamin (,VITAMIN B-12,) 1000 MCG/ML injection INJECT 1000 MCG IM ONCE MONTHLY 07/09/17  Yes Rehman, Mechele Dawley, MD  dicyclomine (BENTYL) 10 MG/5ML syrup TAKE 10 MLS (20 MG TOTAL) BY MOUTH 3 (THREE) TIMES DAILY AS NEEDED. Patient taking differently: Take 20 mg by mouth 3 (three) times daily as needed (stomach cramps).  07/26/17  Yes Rehman, Mechele Dawley, MD  diphenoxylate-atropine (LOMOTIL) 2.5-0.025 MG tablet TAKE 1 TABLET BY MOUTH FOUR TIMES A DAY AS NEEDED Patient taking differently: Take one tablet twice daily. 06/28/17  Yes Setzer, Rona Ravens, NP  furosemide (LASIX) 20 MG tablet Take 1 tablet (20 mg total) by mouth daily as needed (Take for weight gain of more than 2 pounds in a day or 5 pounds in 2 days.). 06/16/17  Yes Lavina Hamman, MD  HYDROcodone-acetaminophen (NORCO) 7.5-325 MG tablet Take 1 tablet by mouth every 12 (twelve) hours as needed for moderate pain.   Yes [provider]  nystatin (NYSTATIN) powder Apply 1 g topically 2 (two) times daily as needed (for itching/irritation).   Yes [provider]  pantoprazole (PROTONIX) 40 MG tablet Take 1 tablet (40 mg total) by mouth daily. 06/16/17  Yes Nita Sells, MD  Simethicone (PHAZYME PO) Take 1 tablet by mouth daily as needed (gas).    Yes [provider]  sodium bicarbonate 650 MG tablet TAKE 1 TABLET (650 MG TOTAL) BY MOUTH 2 (TWO) TIMES DAILY. 05/14/17  Yes [provider]  spironolactone (ALDACTONE) 50 MG tablet Take 50 mg by mouth daily.  06/23/17  Yes [provider]  oxyCODONE (OXY IR/ROXICODONE) 5 MG immediate release tablet Take 1-2 tablets (5-10 mg total) by mouth every 4 (four) hours as needed for moderate pain. 09/09/17   Ralene Ok, MD     Vital Signs: BP 110/61   Pulse 86   Temp 98.4 F (36.9 C) (Oral)   Resp 20   Ht 5' 4.5" (1.638 m)   Wt 136 lb 11 oz (62 kg)   SpO2 100%   BMI 23.10 kg/m   Physical Exam right transgluteal drain intact, insertion site okay, mildly tender to palpation, output about 20 mL rust colored fluid; cx- yeast  Imaging: Ct Image Guided Drainage By Percutaneous Catheter  Result Date: 09/18/2017 CLINICAL DATA:  Status post abdominal surgery with development of peritonitis. Worsening leukocytosis with cul de sac pelvic abscess. EXAM: CT GUIDED CATHETER DRAINAGE OF PELVIC PERITONEAL ABSCESS ANESTHESIA/SEDATION: 3.0 mg IV Versed 100 mcg IV Fentanyl Total Moderate Sedation Time:  11 minutes The patient's level of consciousness and physiologic status were continuously monitored during the procedure by Radiology nursing. PROCEDURE: The procedure, risks, benefits, and alternatives were explained to the patient. Questions regarding the procedure  were encouraged and answered. The patient understands and consents to the procedure. A time out was performed prior to initiating the procedure. The right transgluteal region was prepped with chlorhexidine in a sterile fashion, and a sterile drape was applied covering the operative field. A sterile gown and sterile gloves were  used for the procedure. Local anesthesia was provided with 1% Lidocaine. The patient was placed in a near decubitus position with the right side up. From a posterior transgluteal approach, an 18 gauge trocar needle was advanced to the level of a pelvic abscess situated posterior to the bladder and anterior to the rectum. After needle entry, aspiration was performed and a fluid sample sent for culture analysis. A guidewire was advanced into the collection. The tract was dilated and a 10 French percutaneous drainage catheter placed. Catheter position was confirmed by CT. The catheter was flushed and connected to a suction bulb. The catheter was secured at the skin with a Prolene retention suture and StatLock device. COMPLICATIONS: None FINDINGS: Aspiration at the level of the pelvic fluid collection yielded bloody and slightly turbid fluid. A sample was sent for culture analysis. After drain placement, there is excellent return of fluid. IMPRESSION: CT-guided percutaneous catheter drainage of pelvic abscess from a posterior right transgluteal approach. There was return of bloody and turbid fluid with a sample sent for culture analysis. A 10 French drain was placed and attached to suction bulb drainage. Electronically Signed   By: Aletta Edouard M.D.   On: 09/18/2017 08:39    Labs:  CBC:  Recent Labs  09/18/17 0547 09/19/17 0500 09/20/17 0331 09/21/17 0500  WBC 23.3* 19.1* 20.9* 18.1*  HGB 10.1* 9.0* 9.2* 8.7*  HCT 30.7* 27.6* 27.8* 27.2*  PLT 121* 79* 72* 90*    COAGS:  Recent Labs  05/06/17 2133 05/07/17 0244 09/17/17 1021  INR 1.18  --  1.24  APTT  --  21*  --     BMP:  Recent Labs  09/18/17 0547 09/19/17 0500 09/20/17 0331 09/21/17 0500 09/21/17 0800  NA 145 145 140  --  142  K 4.3 3.4* 3.2*  --  3.1*  CL 104 113* 110  --  116*  CO2 26 24 19*  --  18*  GLUCOSE 94 345* 115*  --  90  BUN 28* 22* 17  --  11  CALCIUM 7.4* 6.8* 7.6*  --  7.7*  CREATININE 2.15* 1.97* 1.76*  1.45* 1.40*  GFRNONAA 22* 25* 28* 36* 37*  GFRAA 26* 29* 33* 42* 43*    LIVER FUNCTION TESTS:  Recent Labs  07/28/17 0500 09/10/17 0851 09/11/17 0509 09/13/17 0500  BILITOT 0.4 0.6 0.7 1.2  AST 21 48* 45* 19  ALT 15 21 29 14   ALKPHOS 102 91 93 75  PROT 7.0 5.9* 4.4* 5.7*  ALBUMIN 2.8* 2.4* 1.8* 3.2*    Assessment and Plan: S/p drainage of pelvic cul de sac abscess 10/12; afebrile; drain fluid cx- yeast; WBC 18.1(20.9), hgb 8.7(9.2), plts 90k(72), K 3.1-replace; creat 1.40(1.45); c diff neg 5 days ago; antidiarrheal per IM- ?d/c maalox ; rec f/u CT later this week   Electronically Signed: D. Rowe Robert, PA-C 09/21/2017, 11:44 AM   I spent a total of  at the the patient's bedside AND on the patient's hospital floor or unit, greater than 50% of which was counseling/coordinating care for pelvic abscess drain    Patient ID: Danielle [name], female   DOB: 1946-11-01, 71 y.o.   MRN: [id number]

## 2017-09-21 NOTE — Progress Notes (Signed)
INFECTIOUS DISEASE PROGRESS NOTE  ID: MARIJOSE CURINGTON is a 71 y.o. female with  Principal Problem:   Respiratory failure, post-operative ([place]) Active Problems:   Hypomagnesemia   Acute renal failure superimposed on stage 3 chronic kidney disease ([place])   Hiatal hernia   Immunosuppressed status ([place])   Rectovaginal fistula   Gastrostomy tube in place [facility])   History of repair of hiatal hernia   Peritonitis (HCC)   Fungemia   Encephalopathy acute   Anasarca   Electrolyte imbalance  Subjective: Eating poorly Continued pain.   Abtx:  Anti-infectives    Start     Dose/Rate Route Frequency Ordered Stop   09/13/17 1200  piperacillin-tazobactam (ZOSYN) IVPB 3.375 g     3.375 g 12.5 mL/hr over 240 Minutes Intravenous Every 8 hours 09/13/17 0732     09/12/17 1500  anidulafungin (ERAXIS) 100 mg in sodium chloride 0.9 % 100 mL IVPB  Status:  Discontinued     100 mg 78 mL/hr over 100 Minutes Intravenous Every 24 hours 09/11/17 1448 09/11/17 1500   09/11/17 1500  anidulafungin (ERAXIS) 200 mg in sodium chloride 0.9 % 200 mL IVPB  Status:  Discontinued     200 mg 78 mL/hr over 200 Minutes Intravenous  Once 09/11/17 1448 09/11/17 1500   09/11/17 1400  anidulafungin (ERAXIS) 100 mg in sodium chloride 0.9 % 100 mL IVPB     100 mg 78 mL/hr over 100 Minutes Intravenous Every 24 hours 09/10/17 1246     09/11/17 0000  piperacillin-tazobactam (ZOSYN) IVPB 2.25 g  Status:  Discontinued     2.25 g 100 mL/hr over 30 Minutes Intravenous Every 6 hours 09/10/17 1410 09/13/17 0732   09/10/17 1430  linezolid (ZYVOX) IVPB 600 mg  Status:  Discontinued     600 mg 300 mL/hr over 60 Minutes Intravenous Every 12 hours 09/10/17 1414 09/16/17 1356   09/10/17 1400  anidulafungin (ERAXIS) 200 mg in sodium chloride 0.9 % 200 mL IVPB     200 mg 78 mL/hr over 200 Minutes Intravenous  Once 09/10/17 1246 09/10/17 1651   09/10/17 1400  linezolid (ZYVOX) IVPB 600 mg  Status:  Discontinued     600 mg 300 mL/hr  over 60 Minutes Intravenous Every 12 hours 09/10/17 1344 09/10/17 1414   09/09/17 1400  vancomycin (VANCOCIN) IVPB 1000 mg/200 mL premix  Status:  Discontinued     1,000 mg 200 mL/hr over 60 Minutes Intravenous Every 24 hours 09/09/17 1346 09/10/17 1344   09/09/17 1345  piperacillin-tazobactam (ZOSYN) IVPB 3.375 g  Status:  Discontinued     3.375 g 12.5 mL/hr over 240 Minutes Intravenous Every 8 hours 09/09/17 1344 09/10/17 1410   09/08/17 1000  vancomycin (VANCOCIN) IVPB 1000 mg/200 mL premix     1,000 mg 200 mL/hr over 60 Minutes Intravenous  Once 09/08/17 0946 09/08/17 1100   09/08/17 0950  ceFAZolin (ANCEF) 2-4 GM/100ML-% IVPB    Comments:  Bridget Hartshorn   : cabinet override      09/08/17 0950 09/08/17 1040   09/08/17 0947  vancomycin (VANCOCIN) 1-5 GM/200ML-% IVPB  Status:  Discontinued    Comments:  Bridget Hartshorn   : cabinet override      09/08/17 0947 09/08/17 0956   09/08/17 0941  vancomycin (VANCOCIN) 1-5 GM/200ML-% IVPB    Comments:  [place] Ron   : cabinet override      09/08/17 0941 09/08/17 1000   09/08/17 0810  ceFAZolin (ANCEF) IVPB 2g/100 mL premix  2 g 200 mL/hr over 30 Minutes Intravenous On call to O.R. 09/08/17 0811 09/08/17 1050      Medications:  Scheduled: . chlorhexidine gluconate (MEDLINE KIT)  15 mL Mouth Rinse BID  . Chlorhexidine Gluconate Cloth  6 each Topical Q0600  . feeding supplement  1 Container Oral TID BM  . insulin aspart  0-9 Units Subcutaneous Q4H  . mouth rinse  15 mL Mouth Rinse QID  . megestrol  400 mg Oral Daily  . pantoprazole (PROTONIX) IV  40 mg Intravenous Q24H  . potassium chloride  40 mEq Oral BID  . sodium chloride flush  10 mL Intravenous Q8H  . sodium chloride flush  10-40 mL Intracatheter Q12H    Objective: Vital signs in last 24 hours: Temp:  [98.3 F (36.8 C)-99.4 F (37.4 C)] 98.4 F (36.9 C) (10/16 0300) Pulse Rate:  [34-99] 34 (10/16 1200) Resp:  [20-36] 20 (10/16 1200) BP: (95-123)/(43-61) 104/61 (10/16  1200) SpO2:  [95 %-100 %] 100 % (10/16 1200) Weight:  [62 kg (136 lb 11 oz)] 62 kg (136 lb 11 oz) (10/16 0500)   General appearance: alert, cooperative and no distress Resp: rhonchi anterior - bilateral Cardio: regular rate and rhythm GI: abnormal findings:  hypoactive bowel sounds Extremities: edema none and RUE PIC is clean, non-tender.   Lab Results  Recent Labs  09/20/17 0331 09/21/17 0500 09/21/17 0800  WBC 20.9* 18.1*  --   HGB 9.2* 8.7*  --   HCT 27.8* 27.2*  --   NA 140  --  142  K 3.2*  --  3.1*  CL 110  --  116*  CO2 19*  --  18*  BUN 17  --  11  CREATININE 1.76* 1.45* 1.40*   Liver Panel No results for input(s): PROT, ALBUMIN, AST, ALT, ALKPHOS, BILITOT, BILIDIR, IBILI in the last 72 hours. Sedimentation Rate No results for input(s): ESRSEDRATE in the last 72 hours. C-Reactive Protein No results for input(s): CRP in the last 72 hours.  Microbiology: Recent Results (from the past 240 hour(s))  Culture, blood (routine x 2)     Status: None   Collection Time: 09/15/17  8:23 AM  Result Value Ref Range Status   Specimen Description BLOOD LEFT HAND  Final   Special Requests IN PEDIATRIC BOTTLE Blood Culture adequate volume  Final   Culture   Final    NO GROWTH 5 DAYS Performed at [facility], [address] [place], [place] [address]    Report Status 09/20/2017 FINAL  Final  Culture, blood (routine x 2)     Status: None   Collection Time: 09/15/17  8:23 AM  Result Value Ref Range Status   Specimen Description BLOOD LEFT HAND  Final   Special Requests IN PEDIATRIC BOTTLE Blood Culture adequate volume  Final   Culture   Final    NO GROWTH 5 DAYS Performed at [facility], [place] [address], [place], [place] [address]    Report Status 09/20/2017 FINAL  Final  C difficile quick scan w PCR reflex     Status: None   Collection Time: 09/16/17  2:15 PM  Result Value Ref Range Status   C Diff antigen NEGATIVE NEGATIVE Final   C Diff toxin NEGATIVE  NEGATIVE Final   C Diff interpretation No C. difficile detected.  Final  Aerobic/Anaerobic Culture (surgical/deep wound)     Status: None (Preliminary result)   Collection Time: 09/17/17  7:30 PM  Result Value  Ref Range Status   Specimen Description ABSCESS PELVIC  Final   Special Requests NONE  Final   Gram Stain   Final    ABUNDANT WBC PRESENT, PREDOMINANTLY PMN RARE YEAST Performed at [facility], [address], [place], [place] [address]    Culture   Final    MODERATE YEAST CULTURE REINCUBATED FOR BETTER GROWTH NO ANAEROBES ISOLATED; CULTURE IN PROGRESS FOR 5 DAYS    Report Status PENDING  Incomplete    Studies/Results: No results found.   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  Await sensi from C glabrata Cr improving. Continue to watch.  Nutrition f/u. Alb better.            Bobby Rumpf MD, FACP Infectious Diseases (pager) [phone] [email or web address] 09/21/2017, 1:44 PM  LOS: 12 days

## 2017-09-21 NOTE — Progress Notes (Signed)
Nutrition Follow-up  DOCUMENTATION CODES:   Not applicable  INTERVENTION:   -Continue Boost Breeze TID each supplement provides 250 kcals and 9 grams of protein  -Continue to monitor magnesium, potassium, and phosphorus daily  NUTRITION DIAGNOSIS:   Inadequate oral intake related to acute illness, nausea, vomiting as evidenced by meal completion < 25%. Revised  GOAL:   Patient will meet greater than or equal to 90% of their needs  Unmet but progressing as pt is now on soft diet  MONITOR:   PO intake, Supplement acceptance, Weight trends, Labs, I & O's  ASSESSMENT:   Pt with PMH significant for Crohn's disease, hiatal hernia, CKD III, and SBO. Presents this admission s/p exloratory laparotomy that revealed frank pus in abdomen resulting in septic shock. See significant events below.     10/12- tube feeding stopped at 0900 per chart 10/13- pt pulled out NG tube and it was not replaced. Pt stated it made her feel nauseous and caused vomiting 10/14- pt advanced to clear liquids at 0847 10/15- pt advanced to soft diet at 0804, complained of epigastric fullness, n/v, abdominal abscess drained  10/16- Pt reports feeling better and denies n/v today. Pt reports drinking 1 cup of juice and 1 cup of water yesterday. She denies drinking or sipping on anything else on her trays yesterday. She says that the broths made her feel nauseous so she stays away from them. Pt likes the [facility] and reports tolerating it well. Pt reports no issues swallowing. Denied abdominal pain. Pt had a bowl of peaches on her tray upon visit and reported wanting to try them and other foods today now that she's advanced to a soft diet.   Per weight records in chart pt weighed 136 lbs on 10/16, 141 on 10/15, 142 on on 10/14, and 133 on 10/13. Weight loss of 5 lbs on 10/16 most likely attributed to draining of abdominal abscess.  Medications: Novolog, Protonix, Megace, NS 0.9%  Labs: K 3.1 (L), CBG 105 (H), Cr  1.4 (H), Ca 7.7 (L)  IVF: D5 NS @ 75 mL/hour (306 kcal from dextrose)  Diet Order:  DIET SOFT Room service appropriate? Yes; Fluid consistency: Thin  Skin:   (multiple closed incisions abdomen)  Last BM:  09/20/17  Height:   Ht Readings from Last 1 Encounters:  09/10/17 5' 4.5" (1.638 m)    Weight:   Wt Readings from Last 1 Encounters:  09/21/17 136 lb 11 oz (62 kg)    Ideal Body Weight:  54.5 kg  BMI:  Body mass index is 23.1 kg/m.  Estimated Nutritional Needs:   Kcal:  1690-1930 (28-32 kcal/kg)  Protein:  72-84 grams (1.2-1.4 grams/kg)  Fluid:  >/= 1.8 L/day  EDUCATION NEEDS:   No education needs identified at this time  Superior Intern Pager: [phone] 09/21/2017 9:52 AM

## 2017-09-21 NOTE — Progress Notes (Signed)
OT Cancellation Note  Patient Details Name: Danielle [name] MRN: [id number] DOB: 01-Nov-1946   Cancelled Treatment:    Reason Eval/Treat Not Completed: Fatigue/lethargy limiting ability to participate.  Pt reports she just got back to bed; sat up in chair x 2 hours today. Will return another day.  Rykar Lebleu 09/21/2017, 3:12 PM  Lesle Chris, OTR/L [phone] 09/21/2017

## 2017-09-21 NOTE — Progress Notes (Signed)
[facility] Infusion Coordinator continues to follow pt for IV ABX/antifungal at DC to home.  [facility] will work in partnership with [facility] for [facility].  I have communicated with Mignon Pine, RN with [facility].  If patient discharges after hours, please call [phone].   [name] 09/21/2017, 8:30 AM

## 2017-09-21 NOTE — Progress Notes (Signed)
11 Days Post-Op   Subjective/Chief Complaint: Pt states sat in chair x 2 hrs Tol some PO   Objective: Vital signs in last 24 hours: Temp:  [98.3 F (36.8 C)-99.4 F (37.4 C)] 98.4 F (36.9 C) (10/16 0300) Pulse Rate:  [34-99] 39 (10/16 1300) Resp:  [20-36] 25 (10/16 1300) BP: (95-123)/(43-61) 102/46 (10/16 1300) SpO2:  [98 %-100 %] 100 % (10/16 1300) Weight:  [62 kg (136 lb 11 oz)] 62 kg (136 lb 11 oz) (10/16 0500) Last BM Date: 09/21/17  Intake/Output from previous day: 10/15 0701 - 10/16 0700 In: 2155 [P.O.:240; I.V.:1785; IV Piggyback:130] Out: 1125 [Urine:1125] Intake/Output this shift: Total I/O In: -  Out: 290 [Urine:250; Drains:40]  General appearance: alert and cooperative Cardio: regular rate and rhythm, S1, S2 normal, no murmur, click, rub or gallop GI: soft, approp ttp, ND, incision c/d/i JP SS  Lab Results:   Recent Labs  09/20/17 0331 09/21/17 0500  WBC 20.9* 18.1*  HGB 9.2* 8.7*  HCT 27.8* 27.2*  PLT 72* 90*   BMET  Recent Labs  09/20/17 0331 09/21/17 0500 09/21/17 0800  NA 140  --  142  K 3.2*  --  3.1*  CL 110  --  116*  CO2 19*  --  18*  GLUCOSE 115*  --  90  BUN 17  --  11  CREATININE 1.76* 1.45* 1.40*  CALCIUM 7.6*  --  7.7*   PT/INR No results for input(s): LABPROT, INR in the last 72 hours. ABG No results for input(s): PHART, HCO3 in the last 72 hours.  Invalid input(s): PCO2, PO2  Studies/Results: No results found.  Anti-infectives: Anti-infectives    Start     Dose/Rate Route Frequency Ordered Stop   09/13/17 1200  piperacillin-tazobactam (ZOSYN) IVPB 3.375 g     3.375 g 12.5 mL/hr over 240 Minutes Intravenous Every 8 hours 09/13/17 0732     09/12/17 1500  anidulafungin (ERAXIS) 100 mg in sodium chloride 0.9 % 100 mL IVPB  Status:  Discontinued     100 mg 78 mL/hr over 100 Minutes Intravenous Every 24 hours 09/11/17 1448 09/11/17 1500   09/11/17 1500  anidulafungin (ERAXIS) 200 mg in sodium chloride 0.9 % 200 mL  IVPB  Status:  Discontinued     200 mg 78 mL/hr over 200 Minutes Intravenous  Once 09/11/17 1448 09/11/17 1500   09/11/17 1400  anidulafungin (ERAXIS) 100 mg in sodium chloride 0.9 % 100 mL IVPB     100 mg 78 mL/hr over 100 Minutes Intravenous Every 24 hours 09/10/17 1246     09/11/17 0000  piperacillin-tazobactam (ZOSYN) IVPB 2.25 g  Status:  Discontinued     2.25 g 100 mL/hr over 30 Minutes Intravenous Every 6 hours 09/10/17 1410 09/13/17 0732   09/10/17 1430  linezolid (ZYVOX) IVPB 600 mg  Status:  Discontinued     600 mg 300 mL/hr over 60 Minutes Intravenous Every 12 hours 09/10/17 1414 09/16/17 1356   09/10/17 1400  anidulafungin (ERAXIS) 200 mg in sodium chloride 0.9 % 200 mL IVPB     200 mg 78 mL/hr over 200 Minutes Intravenous  Once 09/10/17 1246 09/10/17 1651   09/10/17 1400  linezolid (ZYVOX) IVPB 600 mg  Status:  Discontinued     600 mg 300 mL/hr over 60 Minutes Intravenous Every 12 hours 09/10/17 1344 09/10/17 1414   09/09/17 1400  vancomycin (VANCOCIN) IVPB 1000 mg/200 mL premix  Status:  Discontinued     1,000 mg 200 mL/hr over  60 Minutes Intravenous Every 24 hours 09/09/17 1346 09/10/17 1344   09/09/17 1345  piperacillin-tazobactam (ZOSYN) IVPB 3.375 g  Status:  Discontinued     3.375 g 12.5 mL/hr over 240 Minutes Intravenous Every 8 hours 09/09/17 1344 09/10/17 1410   09/08/17 1000  vancomycin (VANCOCIN) IVPB 1000 mg/200 mL premix     1,000 mg 200 mL/hr over 60 Minutes Intravenous  Once 09/08/17 0946 09/08/17 1100   09/08/17 0950  ceFAZolin (ANCEF) 2-4 GM/100ML-% IVPB    Comments:  Bridget Hartshorn   : cabinet override      09/08/17 0950 09/08/17 1040   09/08/17 0947  vancomycin (VANCOCIN) 1-5 GM/200ML-% IVPB  Status:  Discontinued    Comments:  Bridget Hartshorn   : cabinet override      09/08/17 0947 09/08/17 0956   09/08/17 0941  vancomycin (VANCOCIN) 1-5 GM/200ML-% IVPB    Comments:  Dellie Catholic   : cabinet override      09/08/17 0941 09/08/17 1000   09/08/17  0810  ceFAZolin (ANCEF) IVPB 2g/100 mL premix     2 g 200 mL/hr over 30 Minutes Intravenous On call to O.R. 09/08/17 0811 09/08/17 1050      Assessment/Plan: s/p Procedure(s): Exploratory laparotomy with takedown of G tube, repair gastrostomy abdominal washout (N/A) Con't PO as tol.  Cont' megace. Cal count Will plan to floor in AM con't with PT Hopefully if con't to improve, home later in the week.  Pt refusing SNF, rehab, HHPT.   LOS: 12 days    [name]., [facility] 09/21/2017

## 2017-09-22 LAB — CBC WITH DIFFERENTIAL/PLATELET
BASOS ABS: 0 10*3/uL (ref 0.0–0.1)
Basophils Relative: 0 %
EOS PCT: 2 %
Eosinophils Absolute: 0.3 10*3/uL (ref 0.0–0.7)
HEMATOCRIT: 25.4 % — AB (ref 36.0–46.0)
Hemoglobin: 8.3 g/dL — ABNORMAL LOW (ref 12.0–15.0)
Lymphocytes Relative: 31 %
Lymphs Abs: 5.6 10*3/uL — ABNORMAL HIGH (ref 0.7–4.0)
MCH: 28.8 pg (ref 26.0–34.0)
MCHC: 32.7 g/dL (ref 30.0–36.0)
MCV: 88.2 fL (ref 78.0–100.0)
MONO ABS: 2.5 10*3/uL — AB (ref 0.1–1.0)
Monocytes Relative: 14 %
NEUTROS ABS: 9.6 10*3/uL — AB (ref 1.7–7.7)
Neutrophils Relative %: 53 %
Platelets: 178 10*3/uL (ref 150–400)
RBC: 2.88 MIL/uL — AB (ref 3.87–5.11)
RDW: 15.6 % — ABNORMAL HIGH (ref 11.5–15.5)
WBC: 18.1 10*3/uL — AB (ref 4.0–10.5)

## 2017-09-22 LAB — COMPREHENSIVE METABOLIC PANEL
ALK PHOS: 87 U/L (ref 38–126)
ALT: 12 U/L — AB (ref 14–54)
ANION GAP: 7 (ref 5–15)
AST: 20 U/L (ref 15–41)
Albumin: 2.2 g/dL — ABNORMAL LOW (ref 3.5–5.0)
BILIRUBIN TOTAL: 0.9 mg/dL (ref 0.3–1.2)
BUN: 8 mg/dL (ref 6–20)
CALCIUM: 7.8 mg/dL — AB (ref 8.9–10.3)
CO2: 17 mmol/L — ABNORMAL LOW (ref 22–32)
CREATININE: 1.25 mg/dL — AB (ref 0.44–1.00)
Chloride: 113 mmol/L — ABNORMAL HIGH (ref 101–111)
GFR, EST AFRICAN AMERICAN: 50 mL/min — AB (ref >60)
GFR, EST NON AFRICAN AMERICAN: 43 mL/min — AB (ref >60)
Glucose, Bld: 83 mg/dL (ref 65–99)
Potassium: 3.1 mmol/L — ABNORMAL LOW (ref 3.5–5.1)
SODIUM: 137 mmol/L (ref 135–145)
TOTAL PROTEIN: 6.1 g/dL — AB (ref 6.5–8.1)

## 2017-09-22 LAB — GLUCOSE, CAPILLARY
GLUCOSE-CAPILLARY: 80 mg/dL (ref 65–99)
GLUCOSE-CAPILLARY: 57 mg/dL — AB (ref 65–99)
Glucose-Capillary: 74 mg/dL (ref 65–99)
Glucose-Capillary: 90 mg/dL (ref 65–99)
Glucose-Capillary: 82 mg/dL (ref 65–99)

## 2017-09-22 LAB — MAGNESIUM: MAGNESIUM: 1.9 mg/dL (ref 1.7–2.4)

## 2017-09-22 MED ORDER — DIAZEPAM 5 MG PO TABS
5.0000 mg | ORAL_TABLET | Freq: Four times a day (QID) | ORAL | Status: DC | PRN
  Administered 2017-09-23: 5 mg via ORAL
  Filled 2017-09-22 (×2): qty 1

## 2017-09-22 NOTE — Progress Notes (Signed)
Pharmacy Antibiotic Note  Danielle [name] is a 71 y.o. female admitted on 09/08/2017 with paraesophageal hiatal hernia s/p laparoscopic G-tube placement and gastropexy, LOA. Post-op ex lap reveals frank pus in abdomen. Patient's currently on broad abx for peritonitis and fungemia.  Today, 09/22/2017: - Day #13 zosyn,  Day #13 anidulafungin - afebrile - WBC better s/p IR drain placement 10/12 - scr continues to improve - plts improved with stopping linezolid (although down again this am)  Plan: - Continue zosyn to 3.375 gm IV q8h (infuse over 4 hrs) - continue Eraxis 100 mg IV q24h per MD - Infectious diseases folloeing - pharmacy will follow at distance - monitor renal function closely __________________________  Height: 5' 4.5" (163.8 cm) Weight: 139 lb 15.9 oz (63.5 kg) IBW/kg (Calculated) : 55.85  Temp (24hrs), Avg:99.1 F (37.3 C), Min:98.5 F (36.9 C), Max:99.7 F (37.6 C)   Recent Labs Lab 09/18/17 0547 09/19/17 0500 09/20/17 0331 09/21/17 0500 09/21/17 0800 09/22/17 0514  WBC 23.3* 19.1* 20.9* 18.1*  --  18.1*  CREATININE 2.15* 1.97* 1.76* 1.45* 1.40* 1.25*    Estimated Creatinine Clearance: 37.5 mL/min (A) (by C-G formula based on SCr of 1.25 mg/dL (H)).    Allergies  Allergen Reactions  . Zithromax [Azithromycin] Swelling and Other (See Comments)    Reaction:  Mouth swelling and blisters   . Aspirin Other (See Comments)    Reaction:  Stomach cramping   . Ibuprofen Other (See Comments)    Pt states that she prefers not to take this medication.   . Iron Nausea And Vomiting    FERROUS SULFATE  . Sulfa Antibiotics Nausea And Vomiting    Antimicrobials this admission: 10/3 Cefazolin x1 dose preop 10/3 Vanc x1 preop, then daily starting 10/4 >> 10/5 10/4 Zosyn >>  10/5 Zyvox >> 10/11 10/5 Eraxis >>  Dose adjustments this admission:   Microbiology results: 10/4 BCx: 2/2 bottles w/ C glabrata (susc pending) 10/5 MRSA PCR: pos 10/5 UCx: NGF 10/10  BCx: NG-F 10/11 C. diff: neg/neg 10/12: pelvic abscess: C. glabrata  Thank you for allowing pharmacy to be a part of this patient's care.  Doreene Eland, PharmD, BCPS.   Pager: [phone] 09/22/2017 10:33 AM

## 2017-09-22 NOTE — Progress Notes (Signed)
INFECTIOUS DISEASE PROGRESS NOTE  ID: Danielle [name] is a 71 y.o. female with  Principal Problem:   Respiratory failure, post-operative ([place]) Active Problems:   Hypomagnesemia   Acute renal failure superimposed on stage 3 chronic kidney disease (HCC)   Hiatal hernia   Immunosuppressed status ([name])   Rectovaginal fistula   Gastrostomy tube in place [facility])   History of repair of hiatal hernia   Peritonitis (HCC)   Fungemia   Encephalopathy acute   Anasarca   Electrolyte imbalance  Subjective: Up in bed, eating, has not completed plate.   Abtx:  Anti-infectives    Start     Dose/Rate Route Frequency Ordered Stop   09/13/17 1200  piperacillin-tazobactam (ZOSYN) IVPB 3.375 g     3.375 g 12.5 mL/hr over 240 Minutes Intravenous Every 8 hours 09/13/17 0732     09/12/17 1500  anidulafungin (ERAXIS) 100 mg in sodium chloride 0.9 % 100 mL IVPB  Status:  Discontinued     100 mg 78 mL/hr over 100 Minutes Intravenous Every 24 hours 09/11/17 1448 09/11/17 1500   09/11/17 1500  anidulafungin (ERAXIS) 200 mg in sodium chloride 0.9 % 200 mL IVPB  Status:  Discontinued     200 mg 78 mL/hr over 200 Minutes Intravenous  Once 09/11/17 1448 09/11/17 1500   09/11/17 1400  anidulafungin (ERAXIS) 100 mg in sodium chloride 0.9 % 100 mL IVPB     100 mg 78 mL/hr over 100 Minutes Intravenous Every 24 hours 09/10/17 1246     09/11/17 0000  piperacillin-tazobactam (ZOSYN) IVPB 2.25 g  Status:  Discontinued     2.25 g 100 mL/hr over 30 Minutes Intravenous Every 6 hours 09/10/17 1410 09/13/17 0732   09/10/17 1430  linezolid (ZYVOX) IVPB 600 mg  Status:  Discontinued     600 mg 300 mL/hr over 60 Minutes Intravenous Every 12 hours 09/10/17 1414 09/16/17 1356   09/10/17 1400  anidulafungin (ERAXIS) 200 mg in sodium chloride 0.9 % 200 mL IVPB     200 mg 78 mL/hr over 200 Minutes Intravenous  Once 09/10/17 1246 09/10/17 1651   09/10/17 1400  linezolid (ZYVOX) IVPB 600 mg  Status:  Discontinued     600  mg 300 mL/hr over 60 Minutes Intravenous Every 12 hours 09/10/17 1344 09/10/17 1414   09/09/17 1400  vancomycin (VANCOCIN) IVPB 1000 mg/200 mL premix  Status:  Discontinued     1,000 mg 200 mL/hr over 60 Minutes Intravenous Every 24 hours 09/09/17 1346 09/10/17 1344   09/09/17 1345  piperacillin-tazobactam (ZOSYN) IVPB 3.375 g  Status:  Discontinued     3.375 g 12.5 mL/hr over 240 Minutes Intravenous Every 8 hours 09/09/17 1344 09/10/17 1410   09/08/17 1000  vancomycin (VANCOCIN) IVPB 1000 mg/200 mL premix     1,000 mg 200 mL/hr over 60 Minutes Intravenous  Once 09/08/17 0946 09/08/17 1100   09/08/17 0950  ceFAZolin (ANCEF) 2-4 GM/100ML-% IVPB    Comments:  Bridget Hartshorn   : cabinet override      09/08/17 0950 09/08/17 1040   09/08/17 0947  vancomycin (VANCOCIN) 1-5 GM/200ML-% IVPB  Status:  Discontinued    Comments:  Bridget Hartshorn   : cabinet override      09/08/17 0947 09/08/17 0956   09/08/17 0941  vancomycin (VANCOCIN) 1-5 GM/200ML-% IVPB    Comments:  [place] Ron   : cabinet override      09/08/17 0941 09/08/17 1000   09/08/17 0810  ceFAZolin (ANCEF) IVPB 2g/100  mL premix     2 g 200 mL/hr over 30 Minutes Intravenous On call to O.R. 09/08/17 0811 09/08/17 1050      Medications:  Scheduled: . chlorhexidine gluconate (MEDLINE KIT)  15 mL Mouth Rinse BID  . Chlorhexidine Gluconate Cloth  6 each Topical Q0600  . feeding supplement  1 Container Oral TID BM  . insulin aspart  0-9 Units Subcutaneous Q4H  . mouth rinse  15 mL Mouth Rinse QID  . megestrol  400 mg Oral Daily  . pantoprazole (PROTONIX) IV  40 mg Intravenous Q24H  . sodium chloride flush  10 mL Intravenous Q8H  . sodium chloride flush  10-40 mL Intracatheter Q12H    Objective: Vital signs in last 24 hours: Temp:  [98.5 F (36.9 C)-99.7 F (37.6 C)] 98.5 F (36.9 C) (10/17 1200) Pulse Rate:  [61-94] 81 (10/17 1600) Resp:  [20-31] 23 (10/17 1600) BP: (93-116)/(33-69) 93/48 (10/17 1600) SpO2:  [100 %] 100 %  (10/17 1600) Weight:  [63.5 kg (139 lb 15.9 oz)] 63.5 kg (139 lb 15.9 oz) (10/17 0500)   General appearance: alert, cooperative, fatigued and no distress Resp: clear to auscultation bilaterally Cardio: regular rate and rhythm GI: normal findings: bowel sounds normal and soft, non-tender  Lab Results  Recent Labs  09/21/17 0500 09/21/17 0800 09/22/17 0514  WBC 18.1*  --  18.1*  HGB 8.7*  --  8.3*  HCT 27.2*  --  25.4*  NA  --  142 137  K  --  3.1* 3.1*  CL  --  116* 113*  CO2  --  18* 17*  BUN  --  11 8  CREATININE 1.45* 1.40* 1.25*   Liver Panel  Recent Labs  09/22/17 0514  PROT 6.1*  ALBUMIN 2.2*  AST 20  ALT 12*  ALKPHOS 87  BILITOT 0.9   Sedimentation Rate No results for input(s): ESRSEDRATE in the last 72 hours. C-Reactive Protein No results for input(s): CRP in the last 72 hours.  Microbiology: Recent Results (from the past 240 hour(s))  Culture, blood (routine x 2)     Status: None   Collection Time: 09/15/17  8:23 AM  Result Value Ref Range Status   Specimen Description BLOOD LEFT HAND  Final   Special Requests IN PEDIATRIC BOTTLE Blood Culture adequate volume  Final   Culture   Final    NO GROWTH 5 DAYS Performed at [facility], [address] [place], [place] [address]    Report Status 09/20/2017 FINAL  Final  Culture, blood (routine x 2)     Status: None   Collection Time: 09/15/17  8:23 AM  Result Value Ref Range Status   Specimen Description BLOOD LEFT HAND  Final   Special Requests IN PEDIATRIC BOTTLE Blood Culture adequate volume  Final   Culture   Final    NO GROWTH 5 DAYS Performed at [facility], [place] [address], [place], [place] [address]    Report Status 09/20/2017 FINAL  Final  C difficile quick scan w PCR reflex     Status: None   Collection Time: 09/16/17  2:15 PM  Result Value Ref Range Status   C Diff antigen NEGATIVE NEGATIVE Final   C Diff toxin NEGATIVE NEGATIVE Final   C Diff interpretation No C. difficile  detected.  Final  Aerobic/Anaerobic Culture (surgical/deep wound)     Status: None (Preliminary result)   Collection Time: 09/17/17  7:30 PM  Result Value Ref Range  Status   Specimen Description ABSCESS PELVIC  Final   Special Requests NONE  Final   Gram Stain   Final    ABUNDANT WBC PRESENT, PREDOMINANTLY PMN RARE YEAST Performed at [facility], [place] [address], [place], [place] [address]    Culture   Final    MODERATE CANDIDA GLABRATA MODERATE CANDIDA ALBICANS NO ANAEROBES ISOLATED; CULTURE IN PROGRESS FOR 5 DAYS    Report Status PENDING  Incomplete    Studies/Results: No results found.   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  PLT dramatically better No change in anbx Await sensi Appreciate nutrition f/u (calorie count)         Bobby Rumpf MD, FACP Infectious Diseases (pager) [phone] [email or web address] 09/22/2017, 4:48 PM  LOS: 13 days

## 2017-09-22 NOTE — Progress Notes (Signed)
PT Cancellation Note  Patient Details Name: ETHELDA DEANGELO MRN: [id number] DOB: 10-Apr-1946   Cancelled Treatment:     Pt refusing PT 2* pain.  Will follow and progress as tolerated.   Kelten Enochs 09/22/2017, 12:16 PM

## 2017-09-22 NOTE — Progress Notes (Signed)
  Nutrition Brief Note  Consult placed for 48 hour calorie count.  Day one results to follow on 09/23/17.   Please hang calorie count envelope on the patient's door. Document percent consumed for each item on the patient's meal tray ticket and keep in envelope. Also document percent of any supplement or snack pt consumes and keep documentation in envelope for RD to review.    Mariana Single RD, LDN Clinical Nutrition Pager # [phone]

## 2017-09-22 NOTE — Progress Notes (Signed)
OT Cancellation Note  Patient Details Name: Danielle [name] MRN: [id number] DOB: 10/14/1946   Cancelled Treatment:    Reason Eval/Treat Not Completed: Patient declined, no reason specified.  RN premedicated pt:  She is unwilling to participate with OT/PT today.  Will check back another day.  Meoshia Billing 09/22/2017, 1:09 PM  Lesle Chris, OTR/L [phone] 09/22/2017

## 2017-09-22 NOTE — Progress Notes (Signed)
OT Cancellation Note  Patient Details Name: Danielle [name] MRN: [id number] DOB: Oct 12, 1946   Cancelled Treatment:    Reason Eval/Treat Not Completed: Other (comment)   Pt states she still has pain.  Not willing to perform any activity at this time.  If schedule permits, I will check back later.   Sallee Hogrefe 09/22/2017, 11:45 AM  Lesle Chris, OTR/L [phone] 09/22/2017

## 2017-09-22 NOTE — Progress Notes (Signed)
PROGRESS NOTE    Danielle [name]  ION:[id number] DOB: 24-Dec-1945 DOA: 09/08/2017 PCP: Iona Beard, MD    Brief Narrative:  [phone] who presented for surgical correction of hiatal hernia. Pt developed septic shock post-operatively with purulent peritonitis. Initially followed by CCM. 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 Acute hypoxic resp failure, post-operatively 1. Patient previously followed by CCM 2. Currently on minimal O2 support 3. Normal respiratory effort on exam 2. Acute metabolic encephalopathy 1. Now much improved 2. Remains alert and oriented 3. Anasarca 1. Suspect secondary to recent voluem resuscitation 2. S/p aggressive diuresis with IV lasix 3. Edema has improved significantly 4. Lasix was stopped stopped secondary to elevated Cr. 4. Acute on CKD3 1. Cr peaked at over 2 2. Have stopped IV lasix 3. Patient continued on gentle IVF 4. Cr continuing to improve 5. Repeat bmet in AM 5. Hypomagnesemia 1. 1.6 this AM 2. Will correct. Repeat level in AM 6. Hypokalemia 1. Will correct 2. Repeat bmet in AM 7. Peritonitis 1. Cont per CCM 2. Earlier had been on linezolid with zosyn. Linezolid had been stopped secondary to decreasing platelets 3. Patient is continued on abx per ID recs 8. Fungemia 1. Per ID, continue anidulafungin for now 2. Pelvic abscess has grown rare yeast 9. Intra-abdominal fluid collection 1. Per IR, 8.7cm culdesac collection s/p drain 2. Culture growing rare yeast 3. IR recs for repeat CT later  this week  DVT prophylaxis: SCD's Code Status: Full Family Communication: Pt in room, family not in room Disposition Plan: Uncertain at this time, per primary  Consultants:   General surgery primary  Infectious disease/hospital medicine consult  Procedures:  Exploratory laparotomy with takedown of G tube, repair gastrostomy abdominal washout (N/A)  Antimicrobials: Anti-infectives    Start     Dose/Rate Route Frequency Ordered Stop   09/13/17 1200  piperacillin-tazobactam (ZOSYN) IVPB 3.375 g     3.375 g 12.5 mL/hr over 240 Minutes Intravenous Every 8 hours 09/13/17 0732     09/12/17 1500  anidulafungin (ERAXIS) 100 mg in sodium chloride 0.9 % 100 mL IVPB  Status:  Discontinued     100 mg 78 mL/hr over 100 Minutes Intravenous Every 24 hours 09/11/17 1448 09/11/17 1500   09/11/17 1500  anidulafungin (ERAXIS) 200 mg in sodium chloride 0.9 % 200 mL IVPB  Status:  Discontinued     200 mg 78 mL/hr over 200 Minutes Intravenous  Once 09/11/17 1448 09/11/17 1500   09/11/17 1400  anidulafungin (ERAXIS) 100 mg in sodium chloride 0.9 % 100 mL IVPB     100 mg 78 mL/hr over 100 Minutes Intravenous Every 24 hours 09/10/17 1246     09/11/17 0000  piperacillin-tazobactam (ZOSYN) IVPB 2.25 g  Status:  Discontinued     2.25 g 100 mL/hr over 30 Minutes Intravenous Every 6 hours 09/10/17 1410 09/13/17 0732   09/10/17 1430  linezolid (ZYVOX) IVPB 600 mg  Status:  Discontinued     600 mg 300 mL/hr over 60 Minutes Intravenous Every 12 hours 09/10/17 1414  09/16/17 1356   09/10/17 1400  anidulafungin (ERAXIS) 200 mg in sodium chloride 0.9 % 200 mL IVPB     200 mg 78 mL/hr over 200 Minutes Intravenous  Once 09/10/17 1246 09/10/17 1651   09/10/17 1400  linezolid (ZYVOX) IVPB 600 mg  Status:  Discontinued     600 mg 300 mL/hr over 60 Minutes Intravenous Every 12 hours 09/10/17 1344 09/10/17 1414   09/09/17 1400  vancomycin (VANCOCIN) IVPB 1000 mg/200 mL premix  Status:  Discontinued     1,000 mg 200  mL/hr over 60 Minutes Intravenous Every 24 hours 09/09/17 1346 09/10/17 1344   09/09/17 1345  piperacillin-tazobactam (ZOSYN) IVPB 3.375 g  Status:  Discontinued     3.375 g 12.5 mL/hr over 240 Minutes Intravenous Every 8 hours 09/09/17 1344 09/10/17 1410   09/08/17 1000  vancomycin (VANCOCIN) IVPB 1000 mg/200 mL premix     1,000 mg 200 mL/hr over 60 Minutes Intravenous  Once 09/08/17 0946 09/08/17 1100   09/08/17 0950  ceFAZolin (ANCEF) 2-4 GM/100ML-% IVPB    Comments:  Bridget Hartshorn   : cabinet override      09/08/17 0950 09/08/17 1040   09/08/17 0947  vancomycin (VANCOCIN) 1-5 GM/200ML-% IVPB  Status:  Discontinued    Comments:  Bridget Hartshorn   : cabinet override      09/08/17 0947 09/08/17 0956   09/08/17 0941  vancomycin (VANCOCIN) 1-5 GM/200ML-% IVPB    Comments:  Dellie Catholic   : cabinet override      09/08/17 0941 09/08/17 1000   09/08/17 0810  ceFAZolin (ANCEF) IVPB 2g/100 mL premix     2 g 200 mL/hr over 30 Minutes Intravenous On call to O.R. 09/08/17 0811 09/08/17 1050      Subjective: She report hard to work with physical therapy due to pain with activity She wants foley out, she want to try to use bedside commode She has loose stool in diaper that need to be changes  Objective: Vitals:   09/22/17 1400 09/22/17 1500 09/22/17 1600 09/22/17 1700  BP: (!) 94/54  (!) 93/48   Pulse: 81 86 81 85  Resp: (!) 26 (!) 25 (!) 23 20  Temp:   98.2 F (36.8 C)   TempSrc:   Oral   SpO2: 100% 100% 100% 100%  Weight:      Height:        Intake/Output Summary (Last 24 hours) at 09/22/17 1839 Last data filed at 09/22/17 1800  Gross per 24 hour  Intake             2025 ml  Output             1510 ml  Net              515 ml   Filed Weights   09/20/17 0500 09/21/17 0500 09/22/17 0500  Weight: 64 kg (141 lb 1.5 oz) 62 kg (136 lb 11 oz) 63.5 kg (139 lb 15.9 oz)    Examination: General exam: Awake, laying in bed, in nad Respiratory system: Normal respiratory effort, no  wheezing Cardiovascular system: regular rate, s1, s2 Gastrointestinal system: post op changes, dressing in place, + drain, positive BS Central nervous system: CN2-12 grossly intact, strength intact Extremities: Perfused, no clubbing Skin: Normal skin turgor, no notable skin lesions seen Psychiatry: Mood normal // no visual hallucinations   Data Reviewed: I have personally reviewed following labs and imaging studies  CBC:  Recent Labs Lab 09/18/17 0547 09/19/17 0500  09/20/17 0331 09/21/17 0500 09/22/17 0514  WBC 23.3* 19.1* 20.9* 18.1* 18.1*  NEUTROABS 14.3* 11.8* 12.3* 10.2* 9.6*  HGB 10.1* 9.0* 9.2* 8.7* 8.3*  HCT 30.7* 27.6* 27.8* 27.2* 25.4*  MCV 85.5 87.1 87.7 88.9 88.2  PLT 121* 79* 72* 90* 242   Basic Metabolic Panel:  Recent Labs Lab 09/16/17 0545 09/17/17 0500 09/18/17 0547 09/19/17 0500 09/20/17 0331 09/21/17 0500 09/21/17 0800 09/22/17 0514  NA 145 144 145 145 140  --  142 137  K 2.9* 3.5 4.3 3.4* 3.2*  --  3.1* 3.1*  CL 108 102 104 113* 110  --  116* 113*  CO2 _0 19*  --  18* 17*  GLUCOSE 123* 112* 94 345* 115*  --  90 83  BUN 18 25* 28* 22* 17  --  11 8  CREATININE 1.78* 1.94* 2.15* 1.97* 1.76* 1.45* 1.40* 1.25*  CALCIUM 7.5* 7.6* 7.4* 6.8* 7.6*  --  7.7* 7.8*  MG 1.8 1.6*  --   --   --   --  1.6* 1.9  PHOS 3.3 4.7*  --   --   --   --   --   --    GFR: Estimated Creatinine Clearance: 37.5 mL/min (A) (by C-G formula based on SCr of 1.25 mg/dL (H)). Liver Function Tests:  Recent Labs Lab 09/22/17 0514  AST 20  ALT 12*  ALKPHOS 87  BILITOT 0.9  PROT 6.1*  ALBUMIN 2.2*   No results for input(s): LIPASE, AMYLASE in the last 168 hours. No results for input(s): AMMONIA in the last 168 hours. Coagulation Profile:  Recent Labs Lab 09/17/17 1021  INR 1.24   Cardiac Enzymes: No results for input(s): CKTOTAL, CKMB, CKMBINDEX, TROPONINI in the last 168 hours. BNP (last 3 results) No results for input(s): PROBNP in the last 8760  hours. HbA1C: No results for input(s): HGBA1C in the last 72 hours. CBG:  Recent Labs Lab 09/21/17 2334 09/22/17 0423 09/22/17 0735 09/22/17 1208 09/22/17 1600  GLUCAP 81 80 74 90 82   Lipid Profile: No results for input(s): CHOL, HDL, LDLCALC, TRIG, CHOLHDL, LDLDIRECT in the last 72 hours. Thyroid Function Tests: No results for input(s): TSH, T4TOTAL, FREET4, T3FREE, THYROIDAB in the last 72 hours. Anemia Panel: No results for input(s): VITAMINB12, FOLATE, FERRITIN, TIBC, IRON, RETICCTPCT in the last 72 hours. Sepsis Labs: No results for input(s): PROCALCITON, LATICACIDVEN in the last 168 hours.  Recent Results (from the past 240 hour(s))  Culture, blood (routine x 2)     Status: None   Collection Time: 09/15/17  8:23 AM  Result Value Ref Range Status   Specimen Description BLOOD LEFT HAND  Final   Special Requests IN PEDIATRIC BOTTLE Blood Culture adequate volume  Final   Culture   Final    NO GROWTH 5 DAYS Performed at [facility], [address], [place], [place] [address]    Report Status 09/20/2017 FINAL  Final  Culture, blood (routine x 2)     Status: None   Collection Time: 09/15/17  8:23 AM  Result Value Ref Range Status   Specimen Description BLOOD LEFT HAND  Final   Special Requests IN PEDIATRIC BOTTLE Blood Culture adequate volume  Final   Culture   Final    NO GROWTH 5 DAYS Performed at [facility], [place] [address], [place], [place] [address]    Report Status 09/20/2017 FINAL  Final  C difficile quick scan w PCR reflex  Status: None   Collection Time: 09/16/17  2:15 PM  Result Value Ref Range Status   C Diff antigen NEGATIVE NEGATIVE Final   C Diff toxin NEGATIVE NEGATIVE Final   C Diff interpretation No C. difficile detected.  Final  Aerobic/Anaerobic Culture (surgical/deep wound)     Status: None (Preliminary result)   Collection Time: 09/17/17  7:30 PM  Result Value Ref Range Status   Specimen Description ABSCESS PELVIC  Final    Special Requests NONE  Final   Gram Stain   Final    ABUNDANT WBC PRESENT, PREDOMINANTLY PMN RARE YEAST Performed at [facility], [place] [address] [place], [place] [address]    Culture   Final    MODERATE CANDIDA GLABRATA MODERATE CANDIDA ALBICANS NO ANAEROBES ISOLATED; CULTURE IN PROGRESS FOR 5 DAYS    Report Status PENDING  Incomplete     Radiology Studies: No results found.  Scheduled Meds: . chlorhexidine gluconate (MEDLINE KIT)  15 mL Mouth Rinse BID  . Chlorhexidine Gluconate Cloth  6 each Topical Q0600  . feeding supplement  1 Container Oral TID BM  . insulin aspart  0-9 Units Subcutaneous Q4H  . mouth rinse  15 mL Mouth Rinse QID  . megestrol  400 mg Oral Daily  . pantoprazole (PROTONIX) IV  40 mg Intravenous Q24H  . sodium chloride flush  10 mL Intravenous Q8H  . sodium chloride flush  10-40 mL Intracatheter Q12H   Continuous Infusions: . anidulafungin Stopped (09/22/17 1640)  . dextrose 5 % and 0.9% NaCl 75 mL/hr at 09/22/17 1700  . feeding supplement (VITAL 1.5 CAL) Stopped (09/17/17 0900)  . piperacillin-tazobactam (ZOSYN)  IV Stopped (09/22/17 1657)     LOS: 13 days   Addam Goeller, MD PhD [facility] Pager [phone]  If 7PM-7AM, please contact night-coverage [email or web address] Password [facility] 09/22/2017, 6:39 PM

## 2017-09-22 NOTE — Progress Notes (Signed)
12 Days Post-Op   Subjective/Chief Complaint: Pt tol some Po for dinner.  Con't to c/o muscle pain/spasms   Objective: Vital signs in last 24 hours: Temp:  [98.5 F (36.9 C)-99.7 F (37.6 C)] 98.8 F (37.1 C) (10/17 0425) Pulse Rate:  [34-92] 81 (10/17 0600) Resp:  [20-36] 22 (10/17 0600) BP: (95-116)/(33-69) 109/39 (10/17 0600) SpO2:  [100 %] 100 % (10/17 0600) Weight:  [63.5 kg (139 lb 15.9 oz)] 63.5 kg (139 lb 15.9 oz) (10/17 0500) Last BM Date: 09/21/17  Intake/Output from previous day: 10/16 0701 - 10/17 0700 In: 3300 [I.V.:2870; IV Piggyback:430] Out: 1350 [Urine:1300; Drains:50] Intake/Output this shift: Total I/O In: 845 [I.V.:845] Out: 760 [Urine:750; Drains:10]  General appearance: alert and cooperative Cardio: regular rate and rhythm, S1, S2 normal, no murmur, click, rub or gallop GI: soft, approp ttp, ND, incision c/d/i, JP SS  Lab Results:   Recent Labs  09/21/17 0500 09/22/17 0514  WBC 18.1* 18.1*  HGB 8.7* 8.3*  HCT 27.2* 25.4*  PLT 90* 178   BMET  Recent Labs  09/21/17 0800 09/22/17 0514  NA 142 137  K 3.1* 3.1*  CL 116* 113*  CO2 18* 17*  GLUCOSE 90 83  BUN 11 8  CREATININE 1.40* 1.25*  CALCIUM 7.7* 7.8*   Anti-infectives: Anti-infectives    Start     Dose/Rate Route Frequency Ordered Stop   09/13/17 1200  piperacillin-tazobactam (ZOSYN) IVPB 3.375 g     3.375 g 12.5 mL/hr over 240 Minutes Intravenous Every 8 hours 09/13/17 0732     09/12/17 1500  anidulafungin (ERAXIS) 100 mg in sodium chloride 0.9 % 100 mL IVPB  Status:  Discontinued     100 mg 78 mL/hr over 100 Minutes Intravenous Every 24 hours 09/11/17 1448 09/11/17 1500   09/11/17 1500  anidulafungin (ERAXIS) 200 mg in sodium chloride 0.9 % 200 mL IVPB  Status:  Discontinued     200 mg 78 mL/hr over 200 Minutes Intravenous  Once 09/11/17 1448 09/11/17 1500   09/11/17 1400  anidulafungin (ERAXIS) 100 mg in sodium chloride 0.9 % 100 mL IVPB     100 mg 78 mL/hr over 100  Minutes Intravenous Every 24 hours 09/10/17 1246     09/11/17 0000  piperacillin-tazobactam (ZOSYN) IVPB 2.25 g  Status:  Discontinued     2.25 g 100 mL/hr over 30 Minutes Intravenous Every 6 hours 09/10/17 1410 09/13/17 0732   09/10/17 1430  linezolid (ZYVOX) IVPB 600 mg  Status:  Discontinued     600 mg 300 mL/hr over 60 Minutes Intravenous Every 12 hours 09/10/17 1414 09/16/17 1356   09/10/17 1400  anidulafungin (ERAXIS) 200 mg in sodium chloride 0.9 % 200 mL IVPB     200 mg 78 mL/hr over 200 Minutes Intravenous  Once 09/10/17 1246 09/10/17 1651   09/10/17 1400  linezolid (ZYVOX) IVPB 600 mg  Status:  Discontinued     600 mg 300 mL/hr over 60 Minutes Intravenous Every 12 hours 09/10/17 1344 09/10/17 1414   09/09/17 1400  vancomycin (VANCOCIN) IVPB 1000 mg/200 mL premix  Status:  Discontinued     1,000 mg 200 mL/hr over 60 Minutes Intravenous Every 24 hours 09/09/17 1346 09/10/17 1344   09/09/17 1345  piperacillin-tazobactam (ZOSYN) IVPB 3.375 g  Status:  Discontinued     3.375 g 12.5 mL/hr over 240 Minutes Intravenous Every 8 hours 09/09/17 1344 09/10/17 1410   09/08/17 1000  vancomycin (VANCOCIN) IVPB 1000 mg/200 mL premix  1,000 mg 200 mL/hr over 60 Minutes Intravenous  Once 09/08/17 0946 09/08/17 1100   09/08/17 0950  ceFAZolin (ANCEF) 2-4 GM/100ML-% IVPB    Comments:  Bridget Hartshorn   : cabinet override      09/08/17 0950 09/08/17 1040   09/08/17 0947  vancomycin (VANCOCIN) 1-5 GM/200ML-% IVPB  Status:  Discontinued    Comments:  Bridget Hartshorn   : cabinet override      09/08/17 0947 09/08/17 0956   09/08/17 0941  vancomycin (VANCOCIN) 1-5 GM/200ML-% IVPB    Comments:  Dellie Catholic   : cabinet override      09/08/17 0941 09/08/17 1000   09/08/17 0810  ceFAZolin (ANCEF) IVPB 2g/100 mL premix     2 g 200 mL/hr over 30 Minutes Intravenous On call to O.R. 09/08/17 0811 09/08/17 1050      Assessment/Plan: s/p Procedure(s): Exploratory laparotomy with takedown of G tube,  repair gastrostomy abdominal washout (N/A) Will adv diet to reg and enourage PO, con't megace.  She may require home TNA based on her nutritional labs Will trx to floor con't to mobilize, PT/OT Pt states she will be going home with her sister and refuses SNF/Rehab    LOS: 13 days    [name]., Anne Hahn 09/22/2017

## 2017-09-23 LAB — AEROBIC/ANAEROBIC CULTURE W GRAM STAIN (SURGICAL/DEEP WOUND)

## 2017-09-23 LAB — GLUCOSE, CAPILLARY
GLUCOSE-CAPILLARY: 148 mg/dL — AB (ref 65–99)
GLUCOSE-CAPILLARY: 70 mg/dL (ref 65–99)
GLUCOSE-CAPILLARY: 77 mg/dL (ref 65–99)
GLUCOSE-CAPILLARY: 79 mg/dL (ref 65–99)
Glucose-Capillary: 59 mg/dL — ABNORMAL LOW (ref 65–99)
Glucose-Capillary: 65 mg/dL (ref 65–99)
Glucose-Capillary: 67 mg/dL (ref 65–99)
Glucose-Capillary: 67 mg/dL (ref 65–99)
Glucose-Capillary: 86 mg/dL (ref 65–99)

## 2017-09-23 LAB — URINALYSIS, ROUTINE W REFLEX MICROSCOPIC
BACTERIA UA: NONE SEEN
Bilirubin Urine: NEGATIVE
Glucose, UA: NEGATIVE mg/dL
Ketones, ur: NEGATIVE mg/dL
Leukocytes, UA: NEGATIVE
Nitrite: NEGATIVE
Protein, ur: NEGATIVE mg/dL
SPECIFIC GRAVITY, URINE: 1.008 (ref 1.005–1.030)
Squamous Epithelial / LPF: NONE SEEN
pH: 7 (ref 5.0–8.0)

## 2017-09-23 LAB — CBC WITH DIFFERENTIAL/PLATELET
BASOS ABS: 0 10*3/uL (ref 0.0–0.1)
BASOS PCT: 0 %
EOS ABS: 0.3 10*3/uL (ref 0.0–0.7)
Eosinophils Relative: 2 %
HCT: 26.1 % — ABNORMAL LOW (ref 36.0–46.0)
HEMOGLOBIN: 8.5 g/dL — AB (ref 12.0–15.0)
Lymphocytes Relative: 25 %
Lymphs Abs: 4 10*3/uL (ref 0.7–4.0)
MCH: 29 pg (ref 26.0–34.0)
MCHC: 32.6 g/dL (ref 30.0–36.0)
MCV: 89.1 fL (ref 78.0–100.0)
MONOS PCT: 14 %
Monocytes Absolute: 2.2 10*3/uL — ABNORMAL HIGH (ref 0.1–1.0)
NEUTROS PCT: 59 %
Neutro Abs: 9.6 10*3/uL — ABNORMAL HIGH (ref 1.7–7.7)
Platelets: 279 10*3/uL (ref 150–400)
RBC: 2.93 MIL/uL — AB (ref 3.87–5.11)
RDW: 15.5 % (ref 11.5–15.5)
WBC: 16.2 10*3/uL — AB (ref 4.0–10.5)

## 2017-09-23 LAB — BASIC METABOLIC PANEL
ANION GAP: 10 (ref 5–15)
BUN: 7 mg/dL (ref 6–20)
CHLORIDE: 113 mmol/L — AB (ref 101–111)
CO2: 15 mmol/L — ABNORMAL LOW (ref 22–32)
CREATININE: 1.15 mg/dL — AB (ref 0.44–1.00)
Calcium: 7.9 mg/dL — ABNORMAL LOW (ref 8.9–10.3)
GFR calc non Af Amer: 47 mL/min — ABNORMAL LOW (ref >60)
GFR, EST AFRICAN AMERICAN: 55 mL/min — AB (ref >60)
Glucose, Bld: 78 mg/dL (ref 65–99)
Potassium: 3.1 mmol/L — ABNORMAL LOW (ref 3.5–5.1)
SODIUM: 138 mmol/L (ref 135–145)

## 2017-09-23 LAB — AEROBIC/ANAEROBIC CULTURE (SURGICAL/DEEP WOUND)

## 2017-09-23 LAB — MAGNESIUM: MAGNESIUM: 1.7 mg/dL (ref 1.7–2.4)

## 2017-09-23 MED ORDER — DEXTROSE 10 % IV SOLN
INTRAVENOUS | Status: DC
  Administered 2017-09-23: 03:00:00 via INTRAVENOUS
  Filled 2017-09-23: qty 1000

## 2017-09-23 MED ORDER — GLUCOSE 4 G PO CHEW
CHEWABLE_TABLET | ORAL | Status: AC
  Filled 2017-09-23: qty 2

## 2017-09-23 MED ORDER — BACID PO TABS
2.0000 | ORAL_TABLET | Freq: Three times a day (TID) | ORAL | Status: DC
  Filled 2017-09-23 (×2): qty 2

## 2017-09-23 MED ORDER — DIPHENOXYLATE-ATROPINE 2.5-0.025 MG PO TABS
1.0000 | ORAL_TABLET | Freq: Four times a day (QID) | ORAL | Status: DC
  Administered 2017-09-23 – 2017-09-26 (×14): 1 via ORAL
  Filled 2017-09-23 (×14): qty 1

## 2017-09-23 MED ORDER — POTASSIUM CHLORIDE 20 MEQ/15ML (10%) PO SOLN
40.0000 meq | Freq: Three times a day (TID) | ORAL | Status: DC
  Filled 2017-09-23 (×2): qty 30

## 2017-09-23 MED ORDER — LACTINEX PO CHEW
2.0000 | CHEWABLE_TABLET | Freq: Three times a day (TID) | ORAL | Status: DC
  Administered 2017-09-23 – 2017-09-26 (×9): 2 via ORAL
  Filled 2017-09-23 (×12): qty 2

## 2017-09-23 MED ORDER — GLUCOSE 4 G PO CHEW
1.0000 | CHEWABLE_TABLET | ORAL | Status: DC | PRN
  Administered 2017-09-23 – 2017-09-26 (×4): 4 g via ORAL
  Filled 2017-09-23 (×2): qty 1

## 2017-09-23 MED ORDER — GLUCOSE 4 G PO CHEW
1.0000 | CHEWABLE_TABLET | Freq: Three times a day (TID) | ORAL | Status: DC
  Administered 2017-09-23: 02:00:00 via ORAL
  Administered 2017-09-23: 4 g via ORAL
  Filled 2017-09-23: qty 1

## 2017-09-23 MED ORDER — ENOXAPARIN SODIUM 40 MG/0.4ML ~~LOC~~ SOLN
40.0000 mg | SUBCUTANEOUS | Status: DC
  Administered 2017-09-23 – 2017-09-26 (×4): 40 mg via SUBCUTANEOUS
  Filled 2017-09-23 (×4): qty 0.4

## 2017-09-23 MED ORDER — HYDROCODONE-ACETAMINOPHEN 7.5-325 MG PO TABS
1.0000 | ORAL_TABLET | Freq: Four times a day (QID) | ORAL | Status: DC | PRN
  Administered 2017-09-23 – 2017-09-26 (×8): 1 via ORAL
  Filled 2017-09-23 (×9): qty 1

## 2017-09-23 MED ORDER — POTASSIUM CHLORIDE CRYS ER 20 MEQ PO TBCR
40.0000 meq | EXTENDED_RELEASE_TABLET | ORAL | Status: AC
  Administered 2017-09-23 (×2): 40 meq via ORAL
  Filled 2017-09-23 (×2): qty 2

## 2017-09-23 NOTE — Progress Notes (Signed)
Pt's glucose 58, given orange juice and crackers. Pt slowly sipping on juice, states she doesn't want to drink the juice because it "goes straight through me and gives me diarrhea." Pt also declines drinking the Boost/Breeze supplement due to same reasons. Pt requested "sugar water, half glass of water with sugar in it."

## 2017-09-23 NOTE — Progress Notes (Signed)
Glucose once again remains low @ 65, given glucose tablets to chew, patient is not very compliant with chewing tablets. Glucose recheck = 67, page place to [facility] on-call for further orders. Pt is asymptomatic.

## 2017-09-23 NOTE — Progress Notes (Addendum)
13 Days Post-Op   Subjective/Chief Complaint: Pt doing well.   Feels ready to go home Tol PO, appears to be increasing   Objective: Vital signs in last 24 hours: Temp:  [98.2 F (36.8 C)-100.4 F (38 C)] 100.4 F (38 C) (10/18 0446) Pulse Rate:  [81-96] 96 (10/18 0446) Resp:  [20-29] 20 (10/18 0446) BP: (93-131)/(48-79) 119/63 (10/18 0446) SpO2:  [84 %-100 %] 99 % (10/18 0446) Last BM Date: 09/21/17  Intake/Output from previous day: 10/17 0701 - 10/18 0700 In: 1275 [P.O.:120; I.V.:825; IV Piggyback:330] Out: 0623 [Urine:1550] Intake/Output this shift: No intake/output data recorded.  General appearance: alert and cooperative GI: soft, incision c/d/i, approp ttp, ND, JP SS  Lab Results:   Recent Labs  09/22/17 0514 09/23/17 0415  WBC 18.1* 16.2*  HGB 8.3* 8.5*  HCT 25.4* 26.1*  PLT 178 279   BMET  Recent Labs  09/22/17 0514 09/23/17 0415  NA 137 138  K 3.1* 3.1*  CL 113* 113*  CO2 17* 15*  GLUCOSE 83 78  BUN 8 7  CREATININE 1.25* 1.15*  CALCIUM 7.8* 7.9*   PT/INR No results for input(s): LABPROT, INR in the last 72 hours. ABG No results for input(s): PHART, HCO3 in the last 72 hours.  Invalid input(s): PCO2, PO2  Studies/Results: No results found.  Anti-infectives: Anti-infectives    Start     Dose/Rate Route Frequency Ordered Stop   09/13/17 1200  piperacillin-tazobactam (ZOSYN) IVPB 3.375 g     3.375 g 12.5 mL/hr over 240 Minutes Intravenous Every 8 hours 09/13/17 0732     09/12/17 1500  anidulafungin (ERAXIS) 100 mg in sodium chloride 0.9 % 100 mL IVPB  Status:  Discontinued     100 mg 78 mL/hr over 100 Minutes Intravenous Every 24 hours 09/11/17 1448 09/11/17 1500   09/11/17 1500  anidulafungin (ERAXIS) 200 mg in sodium chloride 0.9 % 200 mL IVPB  Status:  Discontinued     200 mg 78 mL/hr over 200 Minutes Intravenous  Once 09/11/17 1448 09/11/17 1500   09/11/17 1400  anidulafungin (ERAXIS) 100 mg in sodium chloride 0.9 % 100 mL IVPB      100 mg 78 mL/hr over 100 Minutes Intravenous Every 24 hours 09/10/17 1246     09/11/17 0000  piperacillin-tazobactam (ZOSYN) IVPB 2.25 g  Status:  Discontinued     2.25 g 100 mL/hr over 30 Minutes Intravenous Every 6 hours 09/10/17 1410 09/13/17 0732   09/10/17 1430  linezolid (ZYVOX) IVPB 600 mg  Status:  Discontinued     600 mg 300 mL/hr over 60 Minutes Intravenous Every 12 hours 09/10/17 1414 09/16/17 1356   09/10/17 1400  anidulafungin (ERAXIS) 200 mg in sodium chloride 0.9 % 200 mL IVPB     200 mg 78 mL/hr over 200 Minutes Intravenous  Once 09/10/17 1246 09/10/17 1651   09/10/17 1400  linezolid (ZYVOX) IVPB 600 mg  Status:  Discontinued     600 mg 300 mL/hr over 60 Minutes Intravenous Every 12 hours 09/10/17 1344 09/10/17 1414   09/09/17 1400  vancomycin (VANCOCIN) IVPB 1000 mg/200 mL premix  Status:  Discontinued     1,000 mg 200 mL/hr over 60 Minutes Intravenous Every 24 hours 09/09/17 1346 09/10/17 1344   09/09/17 1345  piperacillin-tazobactam (ZOSYN) IVPB 3.375 g  Status:  Discontinued     3.375 g 12.5 mL/hr over 240 Minutes Intravenous Every 8 hours 09/09/17 1344 09/10/17 1410   09/08/17 1000  vancomycin (VANCOCIN) IVPB 1000 mg/200 mL  premix     1,000 mg 200 mL/hr over 60 Minutes Intravenous  Once 09/08/17 0946 09/08/17 1100   09/08/17 0950  ceFAZolin (ANCEF) 2-4 GM/100ML-% IVPB    Comments:  Bridget Hartshorn   : cabinet override      09/08/17 0950 09/08/17 1040   09/08/17 0947  vancomycin (VANCOCIN) 1-5 GM/200ML-% IVPB  Status:  Discontinued    Comments:  Bridget Hartshorn   : cabinet override      09/08/17 0947 09/08/17 0956   09/08/17 0941  vancomycin (VANCOCIN) 1-5 GM/200ML-% IVPB    Comments:  Dellie Catholic   : cabinet override      09/08/17 0941 09/08/17 1000   09/08/17 0810  ceFAZolin (ANCEF) IVPB 2g/100 mL premix     2 g 200 mL/hr over 30 Minutes Intravenous On call to O.R. 09/08/17 0811 09/08/17 1050      Assessment/Plan: s/p Procedure(s): Exploratory  laparotomy with takedown of G tube, repair gastrostomy abdominal washout (N/A) Con't to encourage PO, megace Will try lomotil for diarrhea and add probiotics Mobilize with PT and with assistance Pt states she wants to go home. Updated her sister by phone.  She did state she would talk with her about home PT.  HHPT orders placed.  LOS: 14 days    [name]., [facility] 09/23/2017

## 2017-09-23 NOTE — Progress Notes (Signed)
Referring Physician(s): Ramirez,A  Supervising Physician: Daryll Brod  Patient Status:  [facility] - In-pt  Chief Complaint:  Pelvic abscess  Subjective: Pt now out of ICU; still with some loose stools, mid abd soreness; denies N/V; sitting up in chair   Allergies: Zithromax [azithromycin]; Aspirin; Ibuprofen; Iron; and Sulfa antibiotics  Medications: Prior to Admission medications   Medication Sig Start Date End Date Taking? Authorizing Provider  albuterol (PROVENTIL HFA;VENTOLIN HFA) 108 (90 Base) MCG/ACT inhaler Inhale 2 puffs into the lungs every 6 (six) hours as needed for wheezing. 12/09/15  Yes Rama, Venetia Maxon, MD  capsicum oleoresin (TRIXAICIN) 0.025 % cream Apply topically 3 (three) times daily. Patient taking differently: Apply 1 application topically 3 (three) times daily as needed (abscess).  06/15/17  Yes Nita Sells, MD  cyanocobalamin (,VITAMIN B-12,) 1000 MCG/ML injection INJECT 1000 MCG IM ONCE MONTHLY 07/09/17  Yes Rehman, Mechele Dawley, MD  dicyclomine (BENTYL) 10 MG/5ML syrup TAKE 10 MLS (20 MG TOTAL) BY MOUTH 3 (THREE) TIMES DAILY AS NEEDED. Patient taking differently: Take 20 mg by mouth 3 (three) times daily as needed (stomach cramps).  07/26/17  Yes Rehman, Mechele Dawley, MD  diphenoxylate-atropine (LOMOTIL) 2.5-0.025 MG tablet TAKE 1 TABLET BY MOUTH FOUR TIMES A DAY AS NEEDED Patient taking differently: Take one tablet twice daily. 06/28/17  Yes Setzer, Rona Ravens, NP  furosemide (LASIX) 20 MG tablet Take 1 tablet (20 mg total) by mouth daily as needed (Take for weight gain of more than 2 pounds in a day or 5 pounds in 2 days.). 06/16/17  Yes Lavina Hamman, MD  HYDROcodone-acetaminophen (NORCO) 7.5-325 MG tablet Take 1 tablet by mouth every 12 (twelve) hours as needed for moderate pain.   Yes [provider]  nystatin (NYSTATIN) powder Apply 1 g topically 2 (two) times daily as needed (for itching/irritation).   Yes [provider]  pantoprazole  (PROTONIX) 40 MG tablet Take 1 tablet (40 mg total) by mouth daily. 06/16/17  Yes Nita Sells, MD  Simethicone (PHAZYME PO) Take 1 tablet by mouth daily as needed (gas).    Yes [provider]  sodium bicarbonate 650 MG tablet TAKE 1 TABLET (650 MG TOTAL) BY MOUTH 2 (TWO) TIMES DAILY. 05/14/17  Yes [provider]  spironolactone (ALDACTONE) 50 MG tablet Take 50 mg by mouth daily.  06/23/17  Yes [provider]  oxyCODONE (OXY IR/ROXICODONE) 5 MG immediate release tablet Take 1-2 tablets (5-10 mg total) by mouth every 4 (four) hours as needed for moderate pain. 09/09/17   Ralene Ok, MD     Vital Signs: BP 106/82 (BP Location: Left Arm)   Pulse 95   Temp 99.8 F (37.7 C) (Oral)   Resp 20   Ht 5' 4.5" (1.638 m)   Wt 139 lb 15.9 oz (63.5 kg)   SpO2 96%   BMI 23.66 kg/m   Physical Exam rt TG drain intact, site NT, output minimal blood-tinged fluid; cx- candida albicans/glabrata  Imaging: No results found.  Labs:  CBC:  Recent Labs  09/20/17 0331 09/21/17 0500 09/22/17 0514 09/23/17 0415  WBC 20.9* 18.1* 18.1* 16.2*  HGB 9.2* 8.7* 8.3* 8.5*  HCT 27.8* 27.2* 25.4* 26.1*  PLT 72* 90* 178 279    COAGS:  Recent Labs  05/06/17 2133 05/07/17 0244 09/17/17 1021  INR 1.18  --  1.24  APTT  --  21*  --     BMP:  Recent Labs  09/20/17 0331 09/21/17 0500 09/21/17 0800 09/22/17  9326 09/23/17 0415  NA 140  --  142 137 138  K 3.2*  --  3.1* 3.1* 3.1*  CL 110  --  116* 113* 113*  CO2 19*  --  18* 17* 15*  GLUCOSE 115*  --  90 83 78  BUN 17  --  11 8 7   CALCIUM 7.6*  --  7.7* 7.8* 7.9*  CREATININE 1.76* 1.45* 1.40* 1.25* 1.15*  GFRNONAA 28* 36* 37* 43* 47*  GFRAA 33* 42* 43* 50* 55*    LIVER FUNCTION TESTS:  Recent Labs  09/10/17 0851 09/11/17 0509 09/13/17 0500 09/22/17 0514  BILITOT 0.6 0.7 1.2 0.9  AST 48* 45* 19 20  ALT 21 29 14  12*  ALKPHOS 91 93 75 87  PROT 5.9* 4.4* 5.7* 6.1*  ALBUMIN 2.4* 1.8* 3.2* 2.2*     Assessment and Plan: S/p drainage of pelvic cul de sac abscess 10/12; temp 99.8; drain fluid cx- candida albicans/glabrata; WBC 16.2(18.1), hgb 8.5, creat 1.15; K 3.1- replace; rec f/u CT A/P with IV contrast only once renal fxn normalized to reassess pelvic abscess   Electronically Signed: D. Rowe Robert, PA-C 09/23/2017, 2:32 PM   I spent a total of 15 minutes at the the patient's bedside AND on the patient's hospital floor or unit, greater than 50% of which was counseling/coordinating care for pelvic abscess drain    Patient ID: Danielle [name], female   DOB: 10/14/1946, 71 y.o.   MRN: [id number]

## 2017-09-23 NOTE — Progress Notes (Signed)
PROGRESS NOTE    Danielle [name]  ION:[id number] DOB: 24-Dec-1945 DOA: 09/08/2017 PCP: Iona Beard, MD    Brief Narrative:  [phone] who presented for surgical correction of hiatal hernia. Pt developed septic shock post-operatively with purulent peritonitis. Initially followed by CCM. 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 Acute hypoxic resp failure, post-operatively 1. Patient previously followed by CCM 2. Currently on minimal O2 support 3. Normal respiratory effort on exam 2. Acute metabolic encephalopathy 1. Now much improved 2. Remains alert and oriented 3. Anasarca 1. Suspect secondary to recent voluem resuscitation 2. S/p aggressive diuresis with IV lasix 3. Edema has improved significantly 4. Lasix was stopped stopped secondary to elevated Cr. 4. Acute on CKD3 1. Cr peaked at over 2 2. Have stopped IV lasix 3. Patient continued on gentle IVF 4. Cr continuing to improve 5. Repeat bmet in AM 5. Hypomagnesemia 1. 1.6 this AM 2. Will correct. Repeat level in AM 6. Hypokalemia 1. Will correct 2. Repeat bmet in AM 7. Peritonitis 1. Cont per CCM 2. Earlier had been on linezolid with zosyn. Linezolid had been stopped secondary to decreasing platelets 3. Patient is continued on abx per ID recs 8. Fungemia 1. Per ID, continue anidulafungin for now 2. Pelvic abscess has grown rare yeast 9. Intra-abdominal fluid collection 1. Per IR, 8.7cm culdesac collection s/p drain 2. Culture growing rare yeast 3. IR recs for repeat CT later  this week  DVT prophylaxis: SCD's Code Status: Full Family Communication: Pt in room, family not in room Disposition Plan: Uncertain at this time, per primary  Consultants:   General surgery primary  Infectious disease/hospital medicine consult  Procedures:  Exploratory laparotomy with takedown of G tube, repair gastrostomy abdominal washout (N/A)  Antimicrobials: Anti-infectives    Start     Dose/Rate Route Frequency Ordered Stop   09/13/17 1200  piperacillin-tazobactam (ZOSYN) IVPB 3.375 g     3.375 g 12.5 mL/hr over 240 Minutes Intravenous Every 8 hours 09/13/17 0732     09/12/17 1500  anidulafungin (ERAXIS) 100 mg in sodium chloride 0.9 % 100 mL IVPB  Status:  Discontinued     100 mg 78 mL/hr over 100 Minutes Intravenous Every 24 hours 09/11/17 1448 09/11/17 1500   09/11/17 1500  anidulafungin (ERAXIS) 200 mg in sodium chloride 0.9 % 200 mL IVPB  Status:  Discontinued     200 mg 78 mL/hr over 200 Minutes Intravenous  Once 09/11/17 1448 09/11/17 1500   09/11/17 1400  anidulafungin (ERAXIS) 100 mg in sodium chloride 0.9 % 100 mL IVPB     100 mg 78 mL/hr over 100 Minutes Intravenous Every 24 hours 09/10/17 1246     09/11/17 0000  piperacillin-tazobactam (ZOSYN) IVPB 2.25 g  Status:  Discontinued     2.25 g 100 mL/hr over 30 Minutes Intravenous Every 6 hours 09/10/17 1410 09/13/17 0732   09/10/17 1430  linezolid (ZYVOX) IVPB 600 mg  Status:  Discontinued     600 mg 300 mL/hr over 60 Minutes Intravenous Every 12 hours 09/10/17 1414  09/16/17 1356   09/10/17 1400  anidulafungin (ERAXIS) 200 mg in sodium chloride 0.9 % 200 mL IVPB     200 mg 78 mL/hr over 200 Minutes Intravenous  Once 09/10/17 1246 09/10/17 1651   09/10/17 1400  linezolid (ZYVOX) IVPB 600 mg  Status:  Discontinued     600 mg 300 mL/hr over 60 Minutes Intravenous Every 12 hours 09/10/17 1344 09/10/17 1414   09/09/17 1400  vancomycin (VANCOCIN) IVPB 1000 mg/200 mL premix  Status:  Discontinued     1,000 mg 200  mL/hr over 60 Minutes Intravenous Every 24 hours 09/09/17 1346 09/10/17 1344   09/09/17 1345  piperacillin-tazobactam (ZOSYN) IVPB 3.375 g  Status:  Discontinued     3.375 g 12.5 mL/hr over 240 Minutes Intravenous Every 8 hours 09/09/17 1344 09/10/17 1410   09/08/17 1000  vancomycin (VANCOCIN) IVPB 1000 mg/200 mL premix     1,000 mg 200 mL/hr over 60 Minutes Intravenous  Once 09/08/17 0946 09/08/17 1100   09/08/17 0950  ceFAZolin (ANCEF) 2-4 GM/100ML-% IVPB    Comments:  Bridget Hartshorn   : cabinet override      09/08/17 0950 09/08/17 1040   09/08/17 0947  vancomycin (VANCOCIN) 1-5 GM/200ML-% IVPB  Status:  Discontinued    Comments:  Bridget Hartshorn   : cabinet override      09/08/17 0947 09/08/17 0956   09/08/17 0941  vancomycin (VANCOCIN) 1-5 GM/200ML-% IVPB    Comments:  Dellie Catholic   : cabinet override      09/08/17 0941 09/08/17 1000   09/08/17 0810  ceFAZolin (ANCEF) IVPB 2g/100 mL premix     2 g 200 mL/hr over 30 Minutes Intravenous On call to O.R. 09/08/17 0811 09/08/17 1050      Subjective:   d5 started last night due to hypoglycemia, blood sugar 65,  tmax 100.4, k 3.1 This morning reports feels better, sitting up in chair, foley removed, one of the drain removed, she denies pain She is able to get up and walk around in the room without assitance  Objective: Vitals:   09/22/17 1700 09/22/17 2148 09/22/17 2154 09/23/17 0446  BP:  131/79  119/63  Pulse: 85 89  96  Resp: '20 20  20  '$ Temp:  98.9 F (37.2 C)  (!) 100.4 F (38 C)  TempSrc:  Oral  Oral  SpO2: 100% (!) 84% 100% 99%  Weight:      Height:        Intake/Output Summary (Last 24 hours) at 09/23/17 0743 Last data filed at 09/23/17 0447  Gross per 24 hour  Intake             1275 ml  Output             1550 ml  Net             -275 ml   Filed Weights   09/20/17 0500 09/21/17 0500 09/22/17 0500  Weight: 64 kg (141 lb 1.5 oz) 62 kg (136 lb 11 oz) 63.5 kg (139 lb 15.9 oz)    Examination: General exam:  better, sitting up in chair, NAD Respiratory system: Normal respiratory effort, no wheezing Cardiovascular system: regular rate, s1, s2 Gastrointestinal system: post op changes, dressing in place, + drain, positive BS Central nervous system: CN2-12 grossly intact, strength intact Extremities: Perfused, no clubbing Skin: Normal skin turgor, no notable skin lesions seen Psychiatry: Mood normal // no visual hallucinations   Data Reviewed: I have personally reviewed following labs  and imaging studies  CBC:  Recent Labs Lab 09/19/17 0500 09/20/17 0331 09/21/17 0500 09/22/17 0514 09/23/17 0415  WBC 19.1* 20.9* 18.1* 18.1* 16.2*  NEUTROABS 11.8* 12.3* 10.2* 9.6* 9.6*  HGB 9.0* 9.2* 8.7* 8.3* 8.5*  HCT 27.6* 27.8* 27.2* 25.4* 26.1*  MCV 87.1 87.7 88.9 88.2 89.1  PLT 79* 72* 90* 178 008   Basic Metabolic Panel:  Recent Labs Lab 09/17/17 0500  09/19/17 0500 09/20/17 0331 09/21/17 0500 09/21/17 0800 09/22/17 0514 09/23/17 0415  NA 144  < > 145 140  --  142 137 138  K 3.5  < > 3.4* 3.2*  --  3.1* 3.1* 3.1*  CL 102  < > 113* 110  --  116* 113* 113*  CO2 28  < > 24 19*  --  18* 17* 15*  GLUCOSE 112*  < > 345* 115*  --  90 83 78  BUN 25*  < > 22* 17  --  '11 8 7  '$ CREATININE 1.94*  < > 1.97* 1.76* 1.45* 1.40* 1.25* 1.15*  CALCIUM 7.6*  < > 6.8* 7.6*  --  7.7* 7.8* 7.9*  MG 1.6*  --   --   --   --  1.6* 1.9 1.7  PHOS 4.7*  --   --   --   --   --   --   --   < > = values in this interval not displayed. GFR: Estimated Creatinine Clearance: 40.7 mL/min (A) (by C-G formula based on SCr of 1.15 mg/dL (H)). Liver Function Tests:  Recent Labs Lab 09/22/17 0514  AST 20  ALT 12*  ALKPHOS 87  BILITOT 0.9  PROT 6.1*  ALBUMIN 2.2*   No results for input(s): LIPASE, AMYLASE in the last 168 hours. No results for input(s): AMMONIA in the last 168 hours. Coagulation Profile:  Recent Labs Lab 09/17/17 1021  INR 1.24   Cardiac Enzymes: No results for input(s): CKTOTAL, CKMB,  CKMBINDEX, TROPONINI in the last 168 hours. BNP (last 3 results) No results for input(s): PROBNP in the last 8760 hours. HbA1C: No results for input(s): HGBA1C in the last 72 hours. CBG:  Recent Labs Lab 09/22/17 1600 09/22/17 1950 09/23/17 0051 09/23/17 0157 09/23/17 0441  GLUCAP 82 57* 65 67 86   Lipid Profile: No results for input(s): CHOL, HDL, LDLCALC, TRIG, CHOLHDL, LDLDIRECT in the last 72 hours. Thyroid Function Tests: No results for input(s): TSH, T4TOTAL, FREET4, T3FREE, THYROIDAB in the last 72 hours. Anemia Panel: No results for input(s): VITAMINB12, FOLATE, FERRITIN, TIBC, IRON, RETICCTPCT in the last 72 hours. Sepsis Labs: No results for input(s): PROCALCITON, LATICACIDVEN in the last 168 hours.  Recent Results (from the past 240 hour(s))  Culture, blood (routine x 2)     Status: None   Collection Time: 09/15/17  8:23 AM  Result Value Ref Range Status   Specimen Description BLOOD LEFT HAND  Final   Special Requests IN PEDIATRIC BOTTLE Blood Culture adequate volume  Final   Culture   Final    NO GROWTH 5 DAYS Performed at [facility], [address] [place], [place] [address]    Report Status 09/20/2017 FINAL  Final  Culture, blood (routine x 2)     Status: None   Collection Time: 09/15/17  8:23 AM  Result Value Ref Range Status   Specimen Description BLOOD LEFT HAND  Final   Special Requests IN PEDIATRIC BOTTLE Blood Culture adequate volume  Final   Culture  Final    NO GROWTH 5 DAYS Performed at [facility], [place] [address], [place], [place] [address]    Report Status 09/20/2017 FINAL  Final  C difficile quick scan w PCR reflex     Status: None   Collection Time: 09/16/17  2:15 PM  Result Value Ref Range Status   C Diff antigen NEGATIVE NEGATIVE Final   C Diff toxin NEGATIVE NEGATIVE Final   C Diff interpretation No C. difficile detected.  Final  Aerobic/Anaerobic Culture (surgical/deep wound)     Status: None (Preliminary result)    Collection Time: 09/17/17  7:30 PM  Result Value Ref Range Status   Specimen Description ABSCESS PELVIC  Final   Special Requests NONE  Final   Gram Stain   Final    ABUNDANT WBC PRESENT, PREDOMINANTLY PMN RARE YEAST Performed at [facility], [place] [address] [place], [place] [address]    Culture   Final    MODERATE CANDIDA GLABRATA MODERATE CANDIDA ALBICANS NO ANAEROBES ISOLATED; CULTURE IN PROGRESS FOR 5 DAYS    Report Status PENDING  Incomplete     Radiology Studies: No results found.  Scheduled Meds: . chlorhexidine gluconate (MEDLINE KIT)  15 mL Mouth Rinse BID  . Chlorhexidine Gluconate Cloth  6 each Topical Q0600  . feeding supplement  1 Container Oral TID BM  . glucose  1 tablet Oral TID AC, HS, 0200  . insulin aspart  0-9 Units Subcutaneous Q4H  . mouth rinse  15 mL Mouth Rinse QID  . megestrol  400 mg Oral Daily  . pantoprazole (PROTONIX) IV  40 mg Intravenous Q24H  . potassium chloride  40 mEq Oral Q4H  . sodium chloride flush  10 mL Intravenous Q8H  . sodium chloride flush  10-40 mL Intracatheter Q12H   Continuous Infusions: . anidulafungin Stopped (09/22/17 1640)  . dextrose 25 mL/hr at 09/23/17 0300  . feeding supplement (VITAL 1.5 CAL) Stopped (09/17/17 0900)  . piperacillin-tazobactam (ZOSYN)  IV Stopped (09/23/17 0039)     LOS: 25 days   Ralene Gasparyan, MD PhD [facility] Pager [phone]  If 7PM-7AM, please contact night-coverage [email or web address] Password [email or web address] 09/23/2017, 7:43 AM

## 2017-09-23 NOTE — Progress Notes (Signed)
Calorie Count Note  RD unable to complete calorie count as no envelope with intakes on door. Did note 0% completion of meal this morning (per chart review).  Per records, Boost Gwyneth Revels was provide once yesterday and this morning. Uncertain of intake.  Clayton Bibles, MS, RD, [place] Dietitian Pager: [phone] After Hours Pager: [phone]

## 2017-09-23 NOTE — Care Management Important Message (Signed)
Important Message  Patient Details IM Letter given to Suzanne/Case Manager to present to Patient Name: Danielle [name] MRN: [id number] Date of Birth: 11-22-1946   Medicare Important Message Given:  Yes    Kerin Salen 09/23/2017, 9:40 AM

## 2017-09-23 NOTE — Progress Notes (Addendum)
PT Cancellation Note  Patient Details Name: YUNIQUE DEARCOS MRN: [id number] DOB: [birth year]   Cancelled Treatment:    Reason Eval/Treat Not Completed: Attempted PT tx session-pt unwilling to participate with PT at this time. Will check back another time/day. Pt has repeatedly refused to participate with PT/OT. If pt continues to refuse, will sign off.     Weston Anna, MPT Pager: [phone]

## 2017-09-24 ENCOUNTER — Inpatient Hospital Stay (HOSPITAL_COMMUNITY): Payer: Medicare HMO

## 2017-09-24 LAB — CREATININE, SERUM
CREATININE: 1.11 mg/dL — AB (ref 0.44–1.00)
GFR calc non Af Amer: 49 mL/min — ABNORMAL LOW (ref >60)
GFR, EST AFRICAN AMERICAN: 57 mL/min — AB (ref >60)

## 2017-09-24 LAB — CBC WITH DIFFERENTIAL/PLATELET
BASOS ABS: 0 10*3/uL (ref 0.0–0.1)
Basophils Relative: 0 %
EOS ABS: 0.3 10*3/uL (ref 0.0–0.7)
EOS PCT: 2 %
HCT: 24.8 % — ABNORMAL LOW (ref 36.0–46.0)
Hemoglobin: 8.1 g/dL — ABNORMAL LOW (ref 12.0–15.0)
LYMPHS ABS: 4 10*3/uL (ref 0.7–4.0)
Lymphocytes Relative: 24 %
MCH: 28.8 pg (ref 26.0–34.0)
MCHC: 32.7 g/dL (ref 30.0–36.0)
MCV: 88.3 fL (ref 78.0–100.0)
MONO ABS: 2.3 10*3/uL — AB (ref 0.1–1.0)
Monocytes Relative: 14 %
Neutro Abs: 9.7 10*3/uL — ABNORMAL HIGH (ref 1.7–7.7)
Neutrophils Relative %: 60 %
PLATELETS: 358 10*3/uL (ref 150–400)
RBC: 2.81 MIL/uL — AB (ref 3.87–5.11)
RDW: 15.5 % (ref 11.5–15.5)
WBC: 16.3 10*3/uL — AB (ref 4.0–10.5)

## 2017-09-24 LAB — GLUCOSE, CAPILLARY
GLUCOSE-CAPILLARY: 70 mg/dL (ref 65–99)
GLUCOSE-CAPILLARY: 72 mg/dL (ref 65–99)
GLUCOSE-CAPILLARY: 83 mg/dL (ref 65–99)
Glucose-Capillary: 61 mg/dL — ABNORMAL LOW (ref 65–99)
Glucose-Capillary: 57 mg/dL — ABNORMAL LOW (ref 65–99)
Glucose-Capillary: 79 mg/dL (ref 65–99)
Glucose-Capillary: 80 mg/dL (ref 65–99)

## 2017-09-24 MED ORDER — IOPAMIDOL (ISOVUE-300) INJECTION 61%
100.0000 mL | Freq: Once | INTRAVENOUS | Status: AC | PRN
  Administered 2017-09-24: 100 mL via INTRAVENOUS

## 2017-09-24 MED ORDER — IOPAMIDOL (ISOVUE-300) INJECTION 61%
INTRAVENOUS | Status: AC
  Administered 2017-09-24: 15:00:00
  Filled 2017-09-24: qty 100

## 2017-09-24 MED ORDER — GUAIFENESIN-DM 100-10 MG/5ML PO SYRP
5.0000 mL | ORAL_SOLUTION | ORAL | Status: DC | PRN
  Administered 2017-09-24: 5 mL via ORAL
  Filled 2017-09-24 (×2): qty 10

## 2017-09-24 NOTE — Progress Notes (Signed)
INFECTIOUS DISEASE PROGRESS NOTE  ID: CAPUCINE TRYON is a 71 y.o. female with  Principal Problem:   Respiratory failure, post-operative ([place]) Active Problems:   Hypomagnesemia   Acute renal failure superimposed on stage 3 chronic kidney disease ([place])   Hiatal hernia   Immunosuppressed status ([place])   Rectovaginal fistula   Gastrostomy tube in place [facility])   History of repair of hiatal hernia   Peritonitis ([facility])   Fungemia   Encephalopathy acute   Anasarca   Electrolyte imbalance  Subjective: C/o coughing all night.  Denies abd pain.   Abtx:  Anti-infectives    Start     Dose/Rate Route Frequency Ordered Stop   09/13/17 1200  piperacillin-tazobactam (ZOSYN) IVPB 3.375 g     3.375 g 12.5 mL/hr over 240 Minutes Intravenous Every 8 hours 09/13/17 0732     09/12/17 1500  anidulafungin (ERAXIS) 100 mg in sodium chloride 0.9 % 100 mL IVPB  Status:  Discontinued     100 mg 78 mL/hr over 100 Minutes Intravenous Every 24 hours 09/11/17 1448 09/11/17 1500   09/11/17 1500  anidulafungin (ERAXIS) 200 mg in sodium chloride 0.9 % 200 mL IVPB  Status:  Discontinued     200 mg 78 mL/hr over 200 Minutes Intravenous  Once 09/11/17 1448 09/11/17 1500   09/11/17 1400  anidulafungin (ERAXIS) 100 mg in sodium chloride 0.9 % 100 mL IVPB     100 mg 78 mL/hr over 100 Minutes Intravenous Every 24 hours 09/10/17 1246     09/11/17 0000  piperacillin-tazobactam (ZOSYN) IVPB 2.25 g  Status:  Discontinued     2.25 g 100 mL/hr over 30 Minutes Intravenous Every 6 hours 09/10/17 1410 09/13/17 0732   09/10/17 1430  linezolid (ZYVOX) IVPB 600 mg  Status:  Discontinued     600 mg 300 mL/hr over 60 Minutes Intravenous Every 12 hours 09/10/17 1414 09/16/17 1356   09/10/17 1400  anidulafungin (ERAXIS) 200 mg in sodium chloride 0.9 % 200 mL IVPB     200 mg 78 mL/hr over 200 Minutes Intravenous  Once 09/10/17 1246 09/10/17 1651   09/10/17 1400  linezolid (ZYVOX) IVPB 600 mg  Status:  Discontinued     600  mg 300 mL/hr over 60 Minutes Intravenous Every 12 hours 09/10/17 1344 09/10/17 1414   09/09/17 1400  vancomycin (VANCOCIN) IVPB 1000 mg/200 mL premix  Status:  Discontinued     1,000 mg 200 mL/hr over 60 Minutes Intravenous Every 24 hours 09/09/17 1346 09/10/17 1344   09/09/17 1345  piperacillin-tazobactam (ZOSYN) IVPB 3.375 g  Status:  Discontinued     3.375 g 12.5 mL/hr over 240 Minutes Intravenous Every 8 hours 09/09/17 1344 09/10/17 1410   09/08/17 1000  vancomycin (VANCOCIN) IVPB 1000 mg/200 mL premix     1,000 mg 200 mL/hr over 60 Minutes Intravenous  Once 09/08/17 0946 09/08/17 1100   09/08/17 0950  ceFAZolin (ANCEF) 2-4 GM/100ML-% IVPB    Comments:  Bridget Hartshorn   : cabinet override      09/08/17 0950 09/08/17 1040   09/08/17 0947  vancomycin (VANCOCIN) 1-5 GM/200ML-% IVPB  Status:  Discontinued    Comments:  Bridget Hartshorn   : cabinet override      09/08/17 0947 09/08/17 0956   09/08/17 0941  vancomycin (VANCOCIN) 1-5 GM/200ML-% IVPB    Comments:  [place] Ron   : cabinet override      09/08/17 0941 09/08/17 1000   09/08/17 0810  ceFAZolin (ANCEF) IVPB 2g/100  mL premix     2 g 200 mL/hr over 30 Minutes Intravenous On call to O.R. 09/08/17 0811 09/08/17 1050      Medications:  Scheduled: . chlorhexidine gluconate (MEDLINE KIT)  15 mL Mouth Rinse BID  . Chlorhexidine Gluconate Cloth  6 each Topical Q0600  . diphenoxylate-atropine  1 tablet Oral QID  . enoxaparin (LOVENOX) injection  40 mg Subcutaneous Q24H  . feeding supplement  1 Container Oral TID BM  . insulin aspart  0-9 Units Subcutaneous Q4H  . iopamidol      . lactobacillus acidophilus & bulgar  2 tablet Oral TID WC  . mouth rinse  15 mL Mouth Rinse QID  . megestrol  400 mg Oral Daily  . pantoprazole (PROTONIX) IV  40 mg Intravenous Q24H  . sodium chloride flush  10 mL Intravenous Q8H  . sodium chloride flush  10-40 mL Intracatheter Q12H    Objective: Vital signs in last 24 hours: Temp:  [97.9 F (36.6  C)] 97.9 F (36.6 C) (10/19 0619) Pulse Rate:  [88-93] 88 (10/19 0619) Resp:  [20-26] 20 (10/19 0619) BP: (97-113)/(56-72) 97/56 (10/19 0619) SpO2:  [98 %-99 %] 98 % (10/19 0619)   General appearance: alert, cooperative, fatigued and no distress Resp: clear to auscultation bilaterally Cardio: regular rate and rhythm GI: normal findings: bowel sounds normal, soft, non-tender and dressed  Lab Results  Recent Labs  09/22/17 0514 09/23/17 0415 09/24/17 0511  WBC 18.1* 16.2* 16.3*  HGB 8.3* 8.5* 8.1*  HCT 25.4* 26.1* 24.8*  NA 137 138  --   K 3.1* 3.1*  --   CL 113* 113*  --   CO2 17* 15*  --   BUN 8 7  --   CREATININE 1.25* 1.15* 1.11*   Liver Panel  Recent Labs  09/22/17 0514  PROT 6.1*  ALBUMIN 2.2*  AST 20  ALT 12*  ALKPHOS 87  BILITOT 0.9   Sedimentation Rate No results for input(s): ESRSEDRATE in the last 72 hours. C-Reactive Protein No results for input(s): CRP in the last 72 hours.  Microbiology: Recent Results (from the past 240 hour(s))  Culture, blood (routine x 2)     Status: None   Collection Time: 09/15/17  8:23 AM  Result Value Ref Range Status   Specimen Description BLOOD LEFT HAND  Final   Special Requests IN PEDIATRIC BOTTLE Blood Culture adequate volume  Final   Culture   Final    NO GROWTH 5 DAYS Performed at [facility], [address], [place], [place] [address]    Report Status 09/20/2017 FINAL  Final  Culture, blood (routine x 2)     Status: None   Collection Time: 09/15/17  8:23 AM  Result Value Ref Range Status   Specimen Description BLOOD LEFT HAND  Final   Special Requests IN PEDIATRIC BOTTLE Blood Culture adequate volume  Final   Culture   Final    NO GROWTH 5 DAYS Performed at [facility], [place] [address] [place],  [address]    Report Status 09/20/2017 FINAL  Final  C difficile quick scan w PCR reflex     Status: None   Collection Time: 09/16/17  2:15 PM  Result Value Ref Range Status   C Diff  antigen NEGATIVE NEGATIVE Final   C Diff toxin NEGATIVE NEGATIVE Final   C Diff interpretation No C. difficile detected.  Final  Aerobic/Anaerobic Culture (surgical/deep wound)     Status: None  Collection Time: 09/17/17  7:30 PM  Result Value Ref Range Status   Specimen Description ABSCESS PELVIC  Final   Special Requests NONE  Final   Gram Stain   Final    ABUNDANT WBC PRESENT, PREDOMINANTLY PMN RARE YEAST    Culture   Final    MODERATE CANDIDA GLABRATA MODERATE CANDIDA ALBICANS NO ANAEROBES ISOLATED Performed at [facility], [place] [address] [place], [place] [address]    Report Status 09/23/2017 FINAL  Final    Studies/Results: Ct Abdomen Pelvis W Contrast  Result Date: 09/24/2017 CLINICAL DATA:  Status post pelvic abscess drainage. EXAM: CT ABDOMEN AND PELVIS WITH CONTRAST TECHNIQUE: Multidetector CT imaging of the abdomen and pelvis was performed using the standard protocol following bolus administration of intravenous contrast. CONTRAST:  159m ISOVUE-300 IOPAMIDOL (ISOVUE-300) INJECTION 61% COMPARISON:  CT scan of September 16, 2017. FINDINGS: Lower chest: Moderate bilateral pleural effusions are noted with adjacent atelectasis. There is again noted large hiatal hernia. Crescent shaped fluid collection is again noted around the hernia. One surgically placed drainage catheter has been removed from this area. The remaining surgically placed drainage catheter is unchanged in position, but the fluid is distal to the distal end of the catheter. Hepatobiliary: Status post cholecystectomy. 3.1 x 2.9 cm fluid collection remains inferior to falciform ligament. No focal abnormality is seen within the liver. Pancreas: Unremarkable. No pancreatic ductal dilatation or surrounding inflammatory changes. Spleen: Normal in size without focal abnormality. Adrenals/Urinary Tract: Adrenal glands are unremarkable. Kidneys are normal, without renal calculi, focal lesion, or hydronephrosis. Bladder is  unremarkable. Stomach/Bowel: There is no evidence of bowel obstruction. Mild wall thickening of cecum and transverse colon is noted which may represent mild inflammation. Status post appendectomy. Vascular/Lymphatic: No significant vascular findings are present. No enlarged abdominal or pelvic lymph nodes. Reproductive: Uterus and bilateral adnexa are unremarkable. Other: There is been interval placement of right trans gluteal percutaneous drainage catheter. Fluid collection noted in pre rectal space on prior exam is significantly smaller. Only a small amount of residual fluid remains, measuring 6.3 x 1.8 cm. Musculoskeletal: No acute or significant osseous findings. IMPRESSION: Moderate bilateral pleural effusions are noted with adjacent atelectasis. Large hiatal hernia is again noted. Crescent-shaped fluid collection is again noted around the hernia which is not significantly changed. One of the surgically placed drains seen within this has been removed in the interval. The remaining drain does not appear to be positioned within the fluid collection. 3 cm fluid collection is seen inferior to falciform ligament which is slightly smaller compared to prior exam. Interval placement of right trans gluteal percutaneous drainage catheter into pre rectal abscess or fluid collection. This fluid collection is significantly smaller compared to prior exam, with only a small amount of residual fluid remaining. Electronically Signed   By: JMarijo Conception M.D.   On: 09/24/2017 14:50     Assessment/Plan: Hiatal hernia repair Partial gastostomy Intra-abd abscess C albicans and glabrata C glabrata fungemia (10-4) flucon and anidulafungin sensi pending Thrombocytopenia CRI Protein calorie malnutrition  Total days of antibiotics: 15 (zosyn/anidulafungin)  Cr continues to improve, nearly normal now.  WBC improved, afebrile.  PLT normal No change in anbx Await sensi of yeast- my great  appreciation to pharmacy.  CT shows some improvement, drain replaced.  Appreciate nutrition f/u (calorie count)         Available as needed over w/e.     JBobby RumpfMD, FACP Infectious Diseases (pager) [phone] 09/24/2017, 3:24 PM  LOS: 15 days

## 2017-09-24 NOTE — Progress Notes (Signed)
OT Cancellation Note  Patient Details Name: Danielle [name] MRN: [id number] DOB: 1946/06/03   Cancelled Treatment:    Reason Eval/Treat Not Completed: Patient at procedure or test/ unavailable  [name] 09/24/2017, 2:19 PM  Lesle Chris, OTR/L [phone] 09/24/2017

## 2017-09-24 NOTE — Progress Notes (Signed)
Pt blood sugars remain labile. Glucose @ 2000 on 10/18 = 67, pt given juice and sugar chewable tablet. Also given orange sherbet per her request. Repeat glucose after 30 minutes equaled 70. Glucose at 0400 also equaled 70. Pt remains asymptomatic with each hypoglycemic reading. No acute distress noted. Pt reported earlier in shift that the only pain medication that helps her is "the shot that you put in my IV, nothing else helps the pain." Pt requested that MD be notified to have IV pain medication restarted. ON CALL surgeon notified, and he states that he will not change back from oral pain relievers to IV pain relievers at patient's request. "She must be on oral pain relievers if she wants to go home." Explained to patient, pt accepting to this conversation.

## 2017-09-24 NOTE — Progress Notes (Signed)
PROGRESS NOTE    Danielle [name]  FAO:[id number] DOB: [birth year] DOA: 09/08/2017 PCP: Iona Beard, MD    Brief Narrative:  [phone] who presented for surgical correction of hiatal hernia. Pt developed septic shock post-operatively with purulent peritonitis. Initially followed by CCM. Hospitalist now following for medical management  Assessment & Plan:   Principal Problem:   Respiratory failure, post-operative ([place]) Active Problems:   Hypomagnesemia   Acute renal failure superimposed on stage 3 chronic kidney disease ([place])   Hiatal hernia   Immunosuppressed status ([place])   Rectovaginal fistula   Gastrostomy tube in place [facility])   History of repair of hiatal hernia   Peritonitis (HCC)   Fungemia   Encephalopathy acute   Anasarca   Electrolyte imbalance  1. Acute hypoxic resp failure, post-operatively 1. Patient previously followed by CCM 2. Currently on minimal O2 support 3. Normal respiratory effort on exam 2. Acute metabolic encephalopathy 1. Now much improved 2. Remains alert and oriented 3. Anasarca 1. Suspect secondary to recent voluem resuscitation 2. S/p aggressive diuresis with IV lasix 3. Edema has improved significantly 4. Lasix was stopped stopped secondary to elevated Cr. 4. Acute on CKD3 1. Cr peaked at over 2 2. Have stopped IV lasix 3. Patient continued on gentle IVF 4. Cr continuing to improve 5. Repeat bmet in AM 5. Hypomagnesemia 1. 1.6 this AM 2. Will correct. Repeat level in AM 6. Hypokalemia 1. Will correct 2. Repeat bmet in AM 7. Peritonitis 1. Cont per CCM 2. Earlier had been on linezolid with zosyn. Linezolid had been stopped secondary to decreasing platelets 3. Patient is continued on abx per ID recs 8. Fungemia 1. Per ID, continue anidulafungin for now 2. Pelvic abscess has grown rare yeast 9. Intra-abdominal fluid collection 1. Per IR, 8.7cm culdesac collection s/p drain 2. Culture growing rare yeast 3. IR recs for repeat CT later  this week  DVT prophylaxis: SCD's Code Status: Full Family Communication: Pt in room, family not in room Disposition Plan: Uncertain at this time, per primary ( general surgery)  Consultants:   General surgery primary  Infectious disease/hospital medicine consult  Procedures:  Exploratory laparotomy with takedown of G tube, repair gastrostomy abdominal washout (N/A)  Antimicrobials: Anti-infectives    Start     Dose/Rate Route Frequency Ordered Stop   09/13/17 1200  piperacillin-tazobactam (ZOSYN) IVPB 3.375 g     3.375 g 12.5 mL/hr over 240 Minutes Intravenous Every 8 hours 09/13/17 0732     09/12/17 1500  anidulafungin (ERAXIS) 100 mg in sodium chloride 0.9 % 100 mL IVPB  Status:  Discontinued     100 mg 78 mL/hr over 100 Minutes Intravenous Every 24 hours 09/11/17 1448 09/11/17 1500   09/11/17 1500  anidulafungin (ERAXIS) 200 mg in sodium chloride 0.9 % 200 mL IVPB  Status:  Discontinued     200 mg 78 mL/hr over 200 Minutes Intravenous  Once 09/11/17 1448 09/11/17 1500   09/11/17 1400  anidulafungin (ERAXIS) 100 mg in sodium chloride 0.9 % 100 mL IVPB     100 mg 78 mL/hr over 100 Minutes Intravenous Every 24 hours 09/10/17 1246     09/11/17 0000  piperacillin-tazobactam (ZOSYN) IVPB 2.25 g  Status:  Discontinued     2.25 g 100 mL/hr over 30 Minutes Intravenous Every 6 hours 09/10/17 1410 09/13/17 0732   09/10/17 1430  linezolid (ZYVOX) IVPB 600 mg  Status:  Discontinued     600 mg 300 mL/hr over 60 Minutes Intravenous Every 12  hours 09/10/17 1414 09/16/17 1356   09/10/17 1400  anidulafungin (ERAXIS) 200 mg in sodium chloride 0.9 % 200 mL IVPB     200 mg 78 mL/hr over 200 Minutes Intravenous  Once 09/10/17 1246 09/10/17 1651   09/10/17 1400  linezolid (ZYVOX) IVPB 600 mg  Status:  Discontinued     600 mg 300 mL/hr over 60 Minutes Intravenous Every 12 hours 09/10/17 1344 09/10/17 1414   09/09/17 1400  vancomycin (VANCOCIN) IVPB 1000 mg/200 mL premix  Status:  Discontinued      1,000 mg 200 mL/hr over 60 Minutes Intravenous Every 24 hours 09/09/17 1346 09/10/17 1344   09/09/17 1345  piperacillin-tazobactam (ZOSYN) IVPB 3.375 g  Status:  Discontinued     3.375 g 12.5 mL/hr over 240 Minutes Intravenous Every 8 hours 09/09/17 1344 09/10/17 1410   09/08/17 1000  vancomycin (VANCOCIN) IVPB 1000 mg/200 mL premix     1,000 mg 200 mL/hr over 60 Minutes Intravenous  Once 09/08/17 0946 09/08/17 1100   09/08/17 0950  ceFAZolin (ANCEF) 2-4 GM/100ML-% IVPB    Comments:  Bridget Hartshorn   : cabinet override      09/08/17 0950 09/08/17 1040   09/08/17 0947  vancomycin (VANCOCIN) 1-5 GM/200ML-% IVPB  Status:  Discontinued    Comments:  Bridget Hartshorn   : cabinet override      09/08/17 0947 09/08/17 0956   09/08/17 0941  vancomycin (VANCOCIN) 1-5 GM/200ML-% IVPB    Comments:  Dellie Catholic   : cabinet override      09/08/17 0941 09/08/17 1000   09/08/17 0810  ceFAZolin (ANCEF) IVPB 2g/100 mL premix     2 g 200 mL/hr over 30 Minutes Intravenous On call to O.R. 09/08/17 0811 09/08/17 1050      Subjective:  She has poor oral intake, continue to have hypoglycemia episodes but she does not seem to be symptomatic from it She did not want to work with physical therapy today No fever  Objective: Vitals:   09/23/17 1017 09/23/17 1438 09/23/17 2226 09/24/17 0619  BP: 106/82 120/65 113/72 (!) 97/56  Pulse: 95 96 93 88  Resp: 20 20 (!) 26 20  Temp: 99.8 F (37.7 C) 98.8 F (37.1 C) 97.9 F (36.6 C) 97.9 F (36.6 C)  TempSrc: Oral Oral Oral Oral  SpO2: 96% 100% 99% 98%  Weight:      Height:        Intake/Output Summary (Last 24 hours) at 09/24/17 1605 Last data filed at 09/24/17 0258  Gross per 24 hour  Intake               10 ml  Output              615 ml  Net             -605 ml   Filed Weights   09/20/17 0500 09/21/17 0500 09/22/17 0500  Weight: 64 kg (141 lb 1.5 oz) 62 kg (136 lb 11 oz) 63.5 kg (139 lb 15.9 oz)    Examination: General exam: better,  sitting up in chair, NAD Respiratory system: Normal respiratory effort, no wheezing Cardiovascular system: regular rate, s1, s2 Gastrointestinal system: post op changes, dressing in place, + drain, positive BS Central nervous system: CN2-12 grossly intact, strength intact Extremities: Perfused, no clubbing Skin: Normal skin turgor, no notable skin lesions seen Psychiatry: Mood normal // no visual hallucinations   Data Reviewed: I have personally reviewed following labs and imaging studies  CBC:  Recent Labs Lab 09/20/17 0331 09/21/17 0500 09/22/17 0514 09/23/17 0415 09/24/17 0511  WBC 20.9* 18.1* 18.1* 16.2* 16.3*  NEUTROABS 12.3* 10.2* 9.6* 9.6* 9.7*  HGB 9.2* 8.7* 8.3* 8.5* 8.1*  HCT 27.8* 27.2* 25.4* 26.1* 24.8*  MCV 87.7 88.9 88.2 89.1 88.3  PLT 72* 90* 178 279 993   Basic Metabolic Panel:  Recent Labs Lab 09/19/17 0500 09/20/17 0331 09/21/17 0500 09/21/17 0800 09/22/17 0514 09/23/17 0415 09/24/17 0511  NA 145 140  --  142 137 138  --   K 3.4* 3.2*  --  3.1* 3.1* 3.1*  --   CL 113* 110  --  116* 113* 113*  --   CO2 24 19*  --  18* 17* 15*  --   GLUCOSE 345* 115*  --  90 83 78  --   BUN 22* 17  --  11 8 7   --   CREATININE 1.97* 1.76* 1.45* 1.40* 1.25* 1.15* 1.11*  CALCIUM 6.8* 7.6*  --  7.7* 7.8* 7.9*  --   MG  --   --   --  1.6* 1.9 1.7  --    GFR: Estimated Creatinine Clearance: 42.2 mL/min (A) (by C-G formula based on SCr of 1.11 mg/dL (H)). Liver Function Tests:  Recent Labs Lab 09/22/17 0514  AST 20  ALT 12*  ALKPHOS 87  BILITOT 0.9  PROT 6.1*  ALBUMIN 2.2*   No results for input(s): LIPASE, AMYLASE in the last 168 hours. No results for input(s): AMMONIA in the last 168 hours. Coagulation Profile: No results for input(s): INR, PROTIME in the last 168 hours. Cardiac Enzymes: No results for input(s): CKTOTAL, CKMB, CKMBINDEX, TROPONINI in the last 168 hours. BNP (last 3 results) No results for input(s): PROBNP in the last 8760  hours. HbA1C: No results for input(s): HGBA1C in the last 72 hours. CBG:  Recent Labs Lab 09/23/17 2152 09/23/17 2358 09/24/17 0359 09/24/17 0804 09/24/17 1325  GLUCAP 67 70 70 83 61*   Lipid Profile: No results for input(s): CHOL, HDL, LDLCALC, TRIG, CHOLHDL, LDLDIRECT in the last 72 hours. Thyroid Function Tests: No results for input(s): TSH, T4TOTAL, FREET4, T3FREE, THYROIDAB in the last 72 hours. Anemia Panel: No results for input(s): VITAMINB12, FOLATE, FERRITIN, TIBC, IRON, RETICCTPCT in the last 72 hours. Sepsis Labs: No results for input(s): PROCALCITON, LATICACIDVEN in the last 168 hours.  Recent Results (from the past 240 hour(s))  Culture, blood (routine x 2)     Status: None   Collection Time: 09/15/17  8:23 AM  Result Value Ref Range Status   Specimen Description BLOOD LEFT HAND  Final   Special Requests IN PEDIATRIC BOTTLE Blood Culture adequate volume  Final   Culture   Final    NO GROWTH 5 DAYS Performed at [facility], [address], [place], [place] [address]    Report Status 09/20/2017 FINAL  Final  Culture, blood (routine x 2)     Status: None   Collection Time: 09/15/17  8:23 AM  Result Value Ref Range Status   Specimen Description BLOOD LEFT HAND  Final   Special Requests IN PEDIATRIC BOTTLE Blood Culture adequate volume  Final   Culture   Final    NO GROWTH 5 DAYS Performed at [facility], [place] [address] [place], [place] [address]    Report Status 09/20/2017 FINAL  Final  C difficile quick scan w PCR reflex     Status: None   Collection Time: 09/16/17  2:15 PM  Result Value Ref Range Status   C Diff antigen NEGATIVE NEGATIVE Final   C Diff toxin NEGATIVE NEGATIVE Final   C Diff interpretation No C. difficile detected.  Final  Aerobic/Anaerobic Culture (surgical/deep wound)     Status: None   Collection Time: 09/17/17  7:30 PM  Result Value Ref Range Status   Specimen Description ABSCESS PELVIC  Final   Special Requests NONE   Final   Gram Stain   Final    ABUNDANT WBC PRESENT, PREDOMINANTLY PMN RARE YEAST    Culture   Final    MODERATE CANDIDA GLABRATA MODERATE CANDIDA ALBICANS NO ANAEROBES ISOLATED Performed at [facility], [place] [address], [place], [place] [address]    Report Status 09/23/2017 FINAL  Final     Radiology Studies: Ct Abdomen Pelvis W Contrast  Result Date: 09/24/2017 CLINICAL DATA:  Status post pelvic abscess drainage. EXAM: CT ABDOMEN AND PELVIS WITH CONTRAST TECHNIQUE: Multidetector CT imaging of the abdomen and pelvis was performed using the standard protocol following bolus administration of intravenous contrast. CONTRAST:  160m ISOVUE-300 IOPAMIDOL (ISOVUE-300) INJECTION 61% COMPARISON:  CT scan of September 16, 2017. FINDINGS: Lower chest: Moderate bilateral pleural effusions are noted with adjacent atelectasis. There is again noted large hiatal hernia. Crescent shaped fluid collection is again noted around the hernia. One surgically placed drainage catheter has been removed from this area. The remaining surgically placed drainage catheter is unchanged in position, but the fluid is distal to the distal end of the catheter. Hepatobiliary: Status post cholecystectomy. 3.1 x 2.9 cm fluid collection remains inferior to falciform ligament. No focal abnormality is seen within the liver. Pancreas: Unremarkable. No pancreatic ductal dilatation or surrounding inflammatory changes. Spleen: Normal in size without focal abnormality. Adrenals/Urinary Tract: Adrenal glands are unremarkable. Kidneys are normal, without renal calculi, focal lesion, or hydronephrosis. Bladder is unremarkable. Stomach/Bowel: There is no evidence of bowel obstruction. Mild wall thickening of cecum and transverse colon is noted which may represent mild inflammation. Status post appendectomy. Vascular/Lymphatic: No significant vascular findings are present. No enlarged abdominal or pelvic lymph nodes. Reproductive: Uterus and  bilateral adnexa are unremarkable. Other: There is been interval placement of right trans gluteal percutaneous drainage catheter. Fluid collection noted in pre rectal space on prior exam is significantly smaller. Only a small amount of residual fluid remains, measuring 6.3 x 1.8 cm. Musculoskeletal: No acute or significant osseous findings. IMPRESSION: Moderate bilateral pleural effusions are noted with adjacent atelectasis. Large hiatal hernia is again noted. Crescent-shaped fluid collection is again noted around the hernia which is not significantly changed. One of the surgically placed drains seen within this has been removed in the interval. The remaining drain does not appear to be positioned within the fluid collection. 3 cm fluid collection is seen inferior to falciform ligament which is slightly smaller compared to prior exam. Interval placement of right trans gluteal percutaneous drainage catheter into pre rectal abscess or fluid collection. This fluid collection is significantly smaller compared to prior exam, with only a small amount of residual fluid remaining. Electronically Signed   By: JMarijo Conception M.D.   On: 09/24/2017 14:50    Scheduled Meds: . chlorhexidine gluconate (MEDLINE KIT)  15 mL Mouth Rinse BID  . Chlorhexidine Gluconate Cloth  6 each Topical Q0600  . diphenoxylate-atropine  1 tablet Oral QID  . enoxaparin (LOVENOX) injection  40 mg Subcutaneous Q24H  . feeding supplement  1 Container Oral TID BM  . insulin aspart  0-9 Units Subcutaneous Q4H  . lactobacillus acidophilus & bulgar  2 tablet Oral TID WC  . mouth rinse  15 mL Mouth Rinse QID  . megestrol  400 mg Oral Daily  . pantoprazole (PROTONIX) IV  40 mg Intravenous Q24H  . sodium chloride flush  10 mL Intravenous Q8H  . sodium chloride flush  10-40 mL Intracatheter Q12H   Continuous Infusions: . anidulafungin 100 mg (09/24/17 1543)  . piperacillin-tazobactam (ZOSYN)  IV 3.375 g (09/24/17 1300)     LOS: 15 days    Ashunti Schofield, MD PhD [facility] Pager [phone]  If 7PM-7AM, please contact night-coverage [email or web address] Password [email or web address] 09/24/2017, 4:05 PM

## 2017-09-24 NOTE — Progress Notes (Signed)
14 Days Post-Op   Subjective/Chief Complaint: Pt doing well. Taking some PO Pt just got back from CT    Objective: Vital signs in last 24 hours: Temp:  [97.9 F (36.6 C)] 97.9 F (36.6 C) (10/19 0619) Pulse Rate:  [88-93] 88 (10/19 0619) Resp:  [20-26] 20 (10/19 0619) BP: (97-113)/(56-72) 97/56 (10/19 0619) SpO2:  [98 %-99 %] 98 % (10/19 0619) Last BM Date: 09/23/17  Intake/Output from previous day: 10/18 0701 - 10/19 0700 In: 300 [P.O.:90; I.V.:210] Out: 840 [Urine:825; Drains:15] Intake/Output this shift: No intake/output data recorded.  General appearance: alert and cooperative GI: soft, approp ttp, ND, incision c/d/i, JP SS  Lab Results:   Recent Labs  09/23/17 0415 09/24/17 0511  WBC 16.2* 16.3*  HGB 8.5* 8.1*  HCT 26.1* 24.8*  PLT 279 358   BMET  Recent Labs  09/22/17 0514 09/23/17 0415 09/24/17 0511  NA 137 138  --   K 3.1* 3.1*  --   CL 113* 113*  --   CO2 17* 15*  --   GLUCOSE 83 78  --   BUN 8 7  --   CREATININE 1.25* 1.15* 1.11*  CALCIUM 7.8* 7.9*  --    PT/INR No results for input(s): LABPROT, INR in the last 72 hours. ABG No results for input(s): PHART, HCO3 in the last 72 hours.  Invalid input(s): PCO2, PO2  Studies/Results: No results found.  Anti-infectives: Anti-infectives    Start     Dose/Rate Route Frequency Ordered Stop   09/13/17 1200  piperacillin-tazobactam (ZOSYN) IVPB 3.375 g     3.375 g 12.5 mL/hr over 240 Minutes Intravenous Every 8 hours 09/13/17 0732     09/12/17 1500  anidulafungin (ERAXIS) 100 mg in sodium chloride 0.9 % 100 mL IVPB  Status:  Discontinued     100 mg 78 mL/hr over 100 Minutes Intravenous Every 24 hours 09/11/17 1448 09/11/17 1500   09/11/17 1500  anidulafungin (ERAXIS) 200 mg in sodium chloride 0.9 % 200 mL IVPB  Status:  Discontinued     200 mg 78 mL/hr over 200 Minutes Intravenous  Once 09/11/17 1448 09/11/17 1500   09/11/17 1400  anidulafungin (ERAXIS) 100 mg in sodium chloride 0.9 % 100  mL IVPB     100 mg 78 mL/hr over 100 Minutes Intravenous Every 24 hours 09/10/17 1246     09/11/17 0000  piperacillin-tazobactam (ZOSYN) IVPB 2.25 g  Status:  Discontinued     2.25 g 100 mL/hr over 30 Minutes Intravenous Every 6 hours 09/10/17 1410 09/13/17 0732   09/10/17 1430  linezolid (ZYVOX) IVPB 600 mg  Status:  Discontinued     600 mg 300 mL/hr over 60 Minutes Intravenous Every 12 hours 09/10/17 1414 09/16/17 1356   09/10/17 1400  anidulafungin (ERAXIS) 200 mg in sodium chloride 0.9 % 200 mL IVPB     200 mg 78 mL/hr over 200 Minutes Intravenous  Once 09/10/17 1246 09/10/17 1651   09/10/17 1400  linezolid (ZYVOX) IVPB 600 mg  Status:  Discontinued     600 mg 300 mL/hr over 60 Minutes Intravenous Every 12 hours 09/10/17 1344 09/10/17 1414   09/09/17 1400  vancomycin (VANCOCIN) IVPB 1000 mg/200 mL premix  Status:  Discontinued     1,000 mg 200 mL/hr over 60 Minutes Intravenous Every 24 hours 09/09/17 1346 09/10/17 1344   09/09/17 1345  piperacillin-tazobactam (ZOSYN) IVPB 3.375 g  Status:  Discontinued     3.375 g 12.5 mL/hr over 240 Minutes Intravenous Every  8 hours 09/09/17 1344 09/10/17 1410   09/08/17 1000  vancomycin (VANCOCIN) IVPB 1000 mg/200 mL premix     1,000 mg 200 mL/hr over 60 Minutes Intravenous  Once 09/08/17 0946 09/08/17 1100   09/08/17 0950  ceFAZolin (ANCEF) 2-4 GM/100ML-% IVPB    Comments:  Bridget Hartshorn   : cabinet override      09/08/17 0950 09/08/17 1040   09/08/17 0947  vancomycin (VANCOCIN) 1-5 GM/200ML-% IVPB  Status:  Discontinued    Comments:  Bridget Hartshorn   : cabinet override      09/08/17 0947 09/08/17 0956   09/08/17 0941  vancomycin (VANCOCIN) 1-5 GM/200ML-% IVPB    Comments:  Dellie Catholic   : cabinet override      09/08/17 0941 09/08/17 1000   09/08/17 0810  ceFAZolin (ANCEF) IVPB 2g/100 mL premix     2 g 200 mL/hr over 30 Minutes Intravenous On call to O.R. 09/08/17 0811 09/08/17 1050      Assessment/Plan: s/p  Procedure(s): Exploratory laparotomy with takedown of G tube, repair gastrostomy abdominal washout (N/A) Will DC last mediastinal drain con't to encourage PO and PT I d/w pt that she needs to particpate w/ PT.  She is agreeable to HHPT. Pt will likely need abx per ID on DC. Pt may need home TNA.  Encourage PO  LOS: 15 days    [name]., [facility] 09/24/2017

## 2017-09-24 NOTE — Progress Notes (Signed)
PT Cancellation Note  Patient Details Name: Danielle [name] MRN: [id number] DOB: 1946-09-03   Cancelled Treatment:    Reason Eval/Treat Not Completed: Patient at procedure or test/unavailable   Weston Anna, MPT Pager: [phone]

## 2017-09-25 ENCOUNTER — Encounter (HOSPITAL_COMMUNITY): Payer: Self-pay | Admitting: Surgery

## 2017-09-25 DIAGNOSIS — L8990 Pressure ulcer of unspecified site, unspecified stage: Secondary | ICD-10-CM | POA: Insufficient documentation

## 2017-09-25 DIAGNOSIS — E162 Hypoglycemia, unspecified: Secondary | ICD-10-CM

## 2017-09-25 LAB — COMPREHENSIVE METABOLIC PANEL
ALBUMIN: 2.3 g/dL — AB (ref 3.5–5.0)
ALT: 10 U/L — ABNORMAL LOW (ref 14–54)
ANION GAP: 7 (ref 5–15)
AST: 16 U/L (ref 15–41)
Alkaline Phosphatase: 86 U/L (ref 38–126)
BUN: <5 mg/dL — ABNORMAL LOW (ref 6–20)
CHLORIDE: 115 mmol/L — AB (ref 101–111)
CO2: 17 mmol/L — AB (ref 22–32)
Calcium: 8.1 mg/dL — ABNORMAL LOW (ref 8.9–10.3)
Creatinine, Ser: 1.18 mg/dL — ABNORMAL HIGH (ref 0.44–1.00)
GFR calc non Af Amer: 46 mL/min — ABNORMAL LOW (ref >60)
GFR, EST AFRICAN AMERICAN: 53 mL/min — AB (ref >60)
GLUCOSE: 109 mg/dL — AB (ref 65–99)
Potassium: 2.8 mmol/L — ABNORMAL LOW (ref 3.5–5.1)
Sodium: 139 mmol/L (ref 135–145)
Total Bilirubin: 0.8 mg/dL (ref 0.3–1.2)
Total Protein: 7 g/dL (ref 6.5–8.1)

## 2017-09-25 LAB — GLUCOSE, CAPILLARY
GLUCOSE-CAPILLARY: 64 mg/dL — AB (ref 65–99)
GLUCOSE-CAPILLARY: 70 mg/dL (ref 65–99)
GLUCOSE-CAPILLARY: 71 mg/dL (ref 65–99)
GLUCOSE-CAPILLARY: 81 mg/dL (ref 65–99)
Glucose-Capillary: 59 mg/dL — ABNORMAL LOW (ref 65–99)
Glucose-Capillary: 91 mg/dL (ref 65–99)
Glucose-Capillary: 82 mg/dL (ref 65–99)
Glucose-Capillary: 85 mg/dL (ref 65–99)

## 2017-09-25 LAB — CBC WITH DIFFERENTIAL/PLATELET
BASOS PCT: 0 %
Basophils Absolute: 0.1 10*3/uL (ref 0.0–0.1)
EOS ABS: 0.3 10*3/uL (ref 0.0–0.7)
EOS PCT: 2 %
HEMATOCRIT: 24.7 % — AB (ref 36.0–46.0)
Hemoglobin: 8.2 g/dL — ABNORMAL LOW (ref 12.0–15.0)
Lymphocytes Relative: 32 %
Lymphs Abs: 4.8 10*3/uL — ABNORMAL HIGH (ref 0.7–4.0)
MCH: 28.9 pg (ref 26.0–34.0)
MCHC: 33.2 g/dL (ref 30.0–36.0)
MCV: 87 fL (ref 78.0–100.0)
MONOS PCT: 12 %
Monocytes Absolute: 1.8 10*3/uL — ABNORMAL HIGH (ref 0.1–1.0)
NEUTROS ABS: 8.1 10*3/uL — AB (ref 1.7–7.7)
Neutrophils Relative %: 54 %
Platelets: 455 10*3/uL — ABNORMAL HIGH (ref 150–400)
RBC: 2.84 MIL/uL — ABNORMAL LOW (ref 3.87–5.11)
RDW: 15.4 % (ref 11.5–15.5)
WBC: 15.1 10*3/uL — ABNORMAL HIGH (ref 4.0–10.5)

## 2017-09-25 LAB — MAGNESIUM: Magnesium: 1.5 mg/dL — ABNORMAL LOW (ref 1.7–2.4)

## 2017-09-25 LAB — MISC LABCORP TEST (SEND OUT): Labcorp test code: 182220

## 2017-09-25 MED ORDER — MAGIC MOUTHWASH
15.0000 mL | Freq: Four times a day (QID) | ORAL | Status: DC | PRN
  Administered 2017-09-25: 15 mL via ORAL
  Filled 2017-09-25: qty 15

## 2017-09-25 MED ORDER — METHOCARBAMOL 1000 MG/10ML IJ SOLN
1000.0000 mg | Freq: Four times a day (QID) | INTRAVENOUS | Status: DC | PRN
  Filled 2017-09-25: qty 10

## 2017-09-25 MED ORDER — HYDROCORTISONE 1 % EX CREA
1.0000 "application " | TOPICAL_CREAM | Freq: Three times a day (TID) | CUTANEOUS | Status: DC | PRN
  Filled 2017-09-25: qty 28

## 2017-09-25 MED ORDER — PSYLLIUM 95 % PO PACK
1.0000 | PACK | Freq: Every day | ORAL | Status: DC
  Administered 2017-09-25 – 2017-09-26 (×2): 1 via ORAL
  Filled 2017-09-25 (×2): qty 1

## 2017-09-25 MED ORDER — SODIUM CHLORIDE 0.9% FLUSH: 3.0000 mL | INTRAVENOUS | Status: DC | PRN

## 2017-09-25 MED ORDER — PANTOPRAZOLE SODIUM 40 MG PO TBEC: 40.0000 mg | DELAYED_RELEASE_TABLET | Freq: Every day | ORAL | Status: DC

## 2017-09-25 MED ORDER — SPIRONOLACTONE 25 MG PO TABS
50.0000 mg | ORAL_TABLET | Freq: Every day | ORAL | Status: DC
  Administered 2017-09-26: 50 mg via ORAL
  Filled 2017-09-25: qty 2

## 2017-09-25 MED ORDER — LACTATED RINGERS IV BOLUS (SEPSIS): 1000.0000 mL | Freq: Three times a day (TID) | INTRAVENOUS | Status: DC | PRN

## 2017-09-25 MED ORDER — DIPHENHYDRAMINE HCL 25 MG PO CAPS: 25.0000 mg | ORAL_CAPSULE | Freq: Four times a day (QID) | ORAL | Status: DC | PRN

## 2017-09-25 MED ORDER — BISMUTH SUBSALICYLATE 262 MG/15ML PO SUSP: 30.0000 mL | Freq: Three times a day (TID) | ORAL | Status: DC | PRN

## 2017-09-25 MED ORDER — LIP MEDEX EX OINT
1.0000 "application " | TOPICAL_OINTMENT | Freq: Two times a day (BID) | CUTANEOUS | Status: DC
  Administered 2017-09-25 – 2017-09-26 (×3): 1 via TOPICAL

## 2017-09-25 MED ORDER — POTASSIUM CHLORIDE 10 MEQ/100ML IV SOLN
10.0000 meq | INTRAVENOUS | Status: AC
  Administered 2017-09-25 (×4): 10 meq via INTRAVENOUS
  Filled 2017-09-25 (×4): qty 100

## 2017-09-25 MED ORDER — MENTHOL 3 MG MT LOZG: 1.0000 | LOZENGE | OROMUCOSAL | Status: DC | PRN

## 2017-09-25 MED ORDER — SODIUM CHLORIDE 0.9% FLUSH: 3.0000 mL | Freq: Two times a day (BID) | INTRAVENOUS | Status: DC

## 2017-09-25 MED ORDER — HYDRALAZINE HCL 20 MG/ML IJ SOLN: 5.0000 mg | Freq: Four times a day (QID) | INTRAMUSCULAR | Status: DC | PRN

## 2017-09-25 MED ORDER — POTASSIUM CHLORIDE CRYS ER 20 MEQ PO TBCR
40.0000 meq | EXTENDED_RELEASE_TABLET | Freq: Two times a day (BID) | ORAL | Status: DC
  Administered 2017-09-25 – 2017-09-26 (×3): 40 meq via ORAL
  Filled 2017-09-25 (×3): qty 2

## 2017-09-25 MED ORDER — PHENOL 1.4 % MT LIQD: 1.0000 | OROMUCOSAL | Status: DC | PRN

## 2017-09-25 MED ORDER — MAGNESIUM SULFATE 4 GM/100ML IV SOLN
4.0000 g | Freq: Once | INTRAVENOUS | Status: AC
  Administered 2017-09-25: 4 g via INTRAVENOUS
  Filled 2017-09-25: qty 100

## 2017-09-25 MED ORDER — PANTOPRAZOLE SODIUM 40 MG PO TBEC
40.0000 mg | DELAYED_RELEASE_TABLET | Freq: Every day | ORAL | Status: DC
  Administered 2017-09-25 – 2017-09-26 (×2): 40 mg via ORAL
  Filled 2017-09-25 (×2): qty 1

## 2017-09-25 MED ORDER — DIPHENHYDRAMINE HCL 50 MG/ML IJ SOLN: 12.5000 mg | Freq: Four times a day (QID) | INTRAMUSCULAR | Status: DC | PRN

## 2017-09-25 MED ORDER — DEXTROSE 50 % IV SOLN
1.0000 | Freq: Four times a day (QID) | INTRAVENOUS | Status: DC | PRN
  Administered 2017-09-26: 50 mL via INTRAVENOUS
  Filled 2017-09-25: qty 50

## 2017-09-25 MED ORDER — HYDROCORTISONE 2.5 % RE CREA
1.0000 "application " | TOPICAL_CREAM | Freq: Four times a day (QID) | RECTAL | Status: DC | PRN
  Filled 2017-09-25: qty 28.35

## 2017-09-25 MED ORDER — ACETAMINOPHEN 650 MG RE SUPP: 650.0000 mg | Freq: Four times a day (QID) | RECTAL | Status: DC | PRN

## 2017-09-25 MED ORDER — SODIUM CHLORIDE 0.9 % IV SOLN
250.0000 mL | INTRAVENOUS | Status: DC | PRN
Start: 2017-09-25 — End: 2017-09-26

## 2017-09-25 MED ORDER — ACETAMINOPHEN 325 MG PO TABS: 325.0000 mg | ORAL_TABLET | Freq: Four times a day (QID) | ORAL | Status: DC | PRN

## 2017-09-25 NOTE — Progress Notes (Signed)
[address] [place], [place] [address] Phone: [phone]  FAX: [phone]      Danielle [name] [id number] 07-14-1946  CARE TEAM:  PCP: Iona Beard, MD  Outpatient Care Team: Patient Care Team: Iona Beard, MD as PCP - General (Family Medicine) Teena Irani, MD as Consulting Physician (Gastroenterology) Rogene Houston, MD as Consulting Physician (Gastroenterology) Burke Keels, MD (Inactive) as Consulting Physician (General Surgery) Leighton Ruff, MD as Consulting Physician (General Surgery) Ralene Ok, MD as Consulting Physician (General Surgery)  Inpatient Treatment Team: Treatment Team: Attending Provider: Ralene Ok, MD; Rounding Team: Ian Bushman, MD; Rounding Team: D[facility], MD; Technician: Etheleen Sia, [place]; Registered Nurse: Dolores Patty, RN; Registered Nurse: Rossie Muskrat, RN   Problem List:   Principal Problem:   Respiratory failure, post-operative [facility]) Active Problems:   Hypomagnesemia   Acute renal failure superimposed on stage 3 chronic kidney disease ([place])   Hiatal hernia   Immunosuppressed status ([place])   Hypokalemia   Hiatal hernia with GERD   Rectovaginal fistula   Anorexia   Protein-calorie malnutrition, severe   Gastrostomy tube in place [facility])   History of repair of hiatal hernia   Peritonitis ([place])   Fungemia   Encephalopathy acute   Anasarca   Electrolyte imbalance   Pressure injury of skin   Hypoglycemia   09/08/2017   POST-OPERATIVE DIAGNOSIS:  Type 4 paraesophageal hiatal hernia   PROCEDURE:  DIAGNOSTIC LAPAROSCOPIC PLACEMENT OF G-TUBE AND GASTROPEXY (N/A) LAPAROSCOPIC LYSIS OF ADHESIONS45 minutes (N/A)  SURGEON:  Ralene Ok, MD - Primary  15 Days Post-Op  09/10/2017  Procedure performed: exploratory laparotomy, takedown of gastrostomy and repair of gastrotomy, washout and placement of mediastinal drains,  hepatorrhaphy x 2  Post-op diagnosis/intraop findings: moderate volume pneumoperitoneum and purulent peritonitis, possible leakage at g tube site  Assessment  Slowly recovering  Plan:   -hypoK - replace -hypoMag - replace -Antibiotics: (zosyn/anidulafungin).  Day 16/? - Length of course per ID Intra-abd abscess C albicans and glabrata C glabrata fungemia (10-4) flucon and anidulafungin sensi pending -wound care - pack daily -hypoglycemia - mild.  try to encourage PO.  D50W backup -PT/OT -VTE prophylaxis- SCDs, etc -mobilize as tolerated to help recovery  30 minutes spent in review, evaluation, examination, counseling, and coordination of care.  More than 50% of that time was spent in counseling.  [name], M.D., F.A.C.S. Gastrointestinal and Minimally Invasive Surgery [facility] [address] [place], [place] [address] [phone] Main / Paging   09/25/2017    Subjective: (Chief complaint)  No major events Not walking much Tol some PO Borderline Glc - refused Tx  Objective:  Vital signs:  Vitals:   09/24/17 1506 09/24/17 2210 09/25/17 0400 09/25/17 0522  BP: 98/62 115/70  (!) 118/57  Pulse: 94 96  92  Resp: (!) 27 20  20   Temp: 98.9 F (37.2 C) 100 F (37.8 C)  100.2 F (37.9 C)  TempSrc: Oral Oral  Oral  SpO2: 100% 100%  100%  Weight:   64.2 kg (141 lb 8.6 oz)   Height:        Last BM Date: 09/24/17  Intake/Output   Yesterday:  10/19 0701 - 10/20 0700 In: 930 [P.O.:930] Out: 1657 [Urine:1075; Drains:7; Stool:575] This shift:  No intake/output data recorded.  Bowel function:  Flatus: YES  BM:  YES  Drain: Serosanguinous   Physical Exam:  General: Pt awake/alert/oriented x4 in  no acute distress Eyes: PERRL, normal EOM.  Sclera clear.  No icterus Neuro: CN II-XII intact w/o focal sensory/motor deficits. Lymph: No head/neck/groin lymphadenopathy Psych:  No  delerium/psychosis/paranoia HENT: Normocephalic, Mucus membranes moist.  No thrush Neck: Supple, No tracheal deviation Chest: No chest wall pain w good excursion CV:  Pulses intact.  Regular rhythm MS: Normal AROM mjr joints.  Signif kyphoscoliosis   Abdomen: Soft.  Nondistended.  Mildly tender at incisions only.  Wound mostly clean w some granulationNo evidence of peritonitis.  No incarcerated hernias.  Rectal - perc drain inplace Ext:  No deformity.  No mjr edema.  No cyanosis Skin: No petechiae / purpura  Results:   Labs: Results for orders placed or performed during the hospital encounter of 09/08/17 (from the past 48 hour(s))  Glucose, capillary     Status: Abnormal   Collection Time: 09/23/17 11:43 AM  Result Value Ref Range   Glucose-Capillary 148 (H) 65 - 99 mg/dL  Glucose, capillary     Status: None   Collection Time: 09/23/17  5:15 PM  Result Value Ref Range   Glucose-Capillary 77 65 - 99 mg/dL  Glucose, capillary     Status: Abnormal   Collection Time: 09/23/17  8:15 PM  Result Value Ref Range   Glucose-Capillary 59 (L) 65 - 99 mg/dL   Comment 1 Notify RN    Comment 2 Document in Chart   Glucose, capillary     Status: None   Collection Time: 09/23/17  9:52 PM  Result Value Ref Range   Glucose-Capillary 67 65 - 99 mg/dL   Comment 1 Notify RN    Comment 2 Document in Chart   Glucose, capillary     Status: None   Collection Time: 09/23/17 11:58 PM  Result Value Ref Range   Glucose-Capillary 70 65 - 99 mg/dL   Comment 1 Notify RN    Comment 2 Document in Chart   Glucose, capillary     Status: None   Collection Time: 09/24/17  3:59 AM  Result Value Ref Range   Glucose-Capillary 70 65 - 99 mg/dL   Comment 1 Notify RN    Comment 2 Document in Chart   Creatinine, serum     Status: Abnormal   Collection Time: 09/24/17  5:11 AM  Result Value Ref Range   Creatinine, Ser 1.11 (H) 0.44 - 1.00 mg/dL   GFR calc non Af Amer 49 (L) >60 mL/min   GFR calc Af Amer 57 (L)  >60 mL/min    Comment: (NOTE) The eGFR has been calculated using the CKD EPI equation. This calculation has not been validated in all clinical situations. eGFR's persistently <60 mL/min signify possible Chronic Kidney Disease.   CBC with Differential/Platelet     Status: Abnormal   Collection Time: 09/24/17  5:11 AM  Result Value Ref Range   WBC 16.3 (H) 4.0 - 10.5 K/uL   RBC 2.81 (L) 3.87 - 5.11 MIL/uL   Hemoglobin 8.1 (L) 12.0 - 15.0 g/dL   HCT 24.8 (L) 36.0 - 46.0 %   MCV 88.3 78.0 - 100.0 fL   MCH 28.8 26.0 - 34.0 pg   MCHC 32.7 30.0 - 36.0 g/dL   RDW 15.5 11.5 - 15.5 %   Platelets 358 150 - 400 K/uL   Neutrophils Relative % 60 %   Neutro Abs 9.7 (H) 1.7 - 7.7 K/uL   Lymphocytes Relative 24 %   Lymphs Abs 4.0 0.7 - 4.0 K/uL   Monocytes Relative  14 %   Monocytes Absolute 2.3 (H) 0.1 - 1.0 K/uL   Eosinophils Relative 2 %   Eosinophils Absolute 0.3 0.0 - 0.7 K/uL   Basophils Relative 0 %   Basophils Absolute 0.0 0.0 - 0.1 K/uL  Glucose, capillary     Status: None   Collection Time: 09/24/17  8:04 AM  Result Value Ref Range   Glucose-Capillary 83 65 - 99 mg/dL  Glucose, capillary     Status: Abnormal   Collection Time: 09/24/17  1:25 PM  Result Value Ref Range   Glucose-Capillary 61 (L) 65 - 99 mg/dL  Glucose, capillary     Status: Abnormal   Collection Time: 09/24/17  4:09 PM  Result Value Ref Range   Glucose-Capillary 57 (L) 65 - 99 mg/dL  Glucose, capillary     Status: None   Collection Time: 09/24/17  5:37 PM  Result Value Ref Range   Glucose-Capillary 79 65 - 99 mg/dL  Glucose, capillary     Status: None   Collection Time: 09/24/17  7:55 PM  Result Value Ref Range   Glucose-Capillary 80 65 - 99 mg/dL  Glucose, capillary     Status: None   Collection Time: 09/24/17 11:53 PM  Result Value Ref Range   Glucose-Capillary 72 65 - 99 mg/dL  Glucose, capillary     Status: None   Collection Time: 09/25/17  3:44 AM  Result Value Ref Range   Glucose-Capillary 71 65 -  99 mg/dL  CBC with Differential/Platelet     Status: Abnormal   Collection Time: 09/25/17  4:55 AM  Result Value Ref Range   WBC 15.1 (H) 4.0 - 10.5 K/uL   RBC 2.84 (L) 3.87 - 5.11 MIL/uL   Hemoglobin 8.2 (L) 12.0 - 15.0 g/dL   HCT 24.7 (L) 36.0 - 46.0 %   MCV 87.0 78.0 - 100.0 fL   MCH 28.9 26.0 - 34.0 pg   MCHC 33.2 30.0 - 36.0 g/dL   RDW 15.4 11.5 - 15.5 %   Platelets 455 (H) 150 - 400 K/uL   Neutrophils Relative % 54 %   Neutro Abs 8.1 (H) 1.7 - 7.7 K/uL   Lymphocytes Relative 32 %   Lymphs Abs 4.8 (H) 0.7 - 4.0 K/uL   Monocytes Relative 12 %   Monocytes Absolute 1.8 (H) 0.1 - 1.0 K/uL   Eosinophils Relative 2 %   Eosinophils Absolute 0.3 0.0 - 0.7 K/uL   Basophils Relative 0 %   Basophils Absolute 0.1 0.0 - 0.1 K/uL  Comprehensive metabolic panel     Status: Abnormal   Collection Time: 09/25/17  4:55 AM  Result Value Ref Range   Sodium 139 135 - 145 mmol/L   Potassium 2.8 (L) 3.5 - 5.1 mmol/L   Chloride 115 (H) 101 - 111 mmol/L   CO2 17 (L) 22 - 32 mmol/L   Glucose, Bld 109 (H) 65 - 99 mg/dL   BUN <5 (L) 6 - 20 mg/dL   Creatinine, Ser 1.18 (H) 0.44 - 1.00 mg/dL   Calcium 8.1 (L) 8.9 - 10.3 mg/dL   Total Protein 7.0 6.5 - 8.1 g/dL   Albumin 2.3 (L) 3.5 - 5.0 g/dL   AST 16 15 - 41 U/L   ALT 10 (L) 14 - 54 U/L   Alkaline Phosphatase 86 38 - 126 U/L   Total Bilirubin 0.8 0.3 - 1.2 mg/dL   GFR calc non Af Amer 46 (L) >60 mL/min   GFR calc Af Amer 53 (  L) >60 mL/min    Comment: (NOTE) The eGFR has been calculated using the CKD EPI equation. This calculation has not been validated in all clinical situations. eGFR's persistently <60 mL/min signify possible Chronic Kidney Disease.    Anion gap 7 5 - 15  Magnesium     Status: Abnormal   Collection Time: 09/25/17  4:55 AM  Result Value Ref Range   Magnesium 1.5 (L) 1.7 - 2.4 mg/dL  Glucose, capillary     Status: Abnormal   Collection Time: 09/25/17  7:59 AM  Result Value Ref Range   Glucose-Capillary 64 (L) 65 - 99  mg/dL  Glucose, capillary     Status: None   Collection Time: 09/25/17  8:44 AM  Result Value Ref Range   Glucose-Capillary 70 65 - 99 mg/dL    Imaging / Studies: Ct Abdomen Pelvis W Contrast  Result Date: 09/24/2017 CLINICAL DATA:  Status post pelvic abscess drainage. EXAM: CT ABDOMEN AND PELVIS WITH CONTRAST TECHNIQUE: Multidetector CT imaging of the abdomen and pelvis was performed using the standard protocol following bolus administration of intravenous contrast. CONTRAST:  15m ISOVUE-300 IOPAMIDOL (ISOVUE-300) INJECTION 61% COMPARISON:  CT scan of September 16, 2017. FINDINGS: Lower chest: Moderate bilateral pleural effusions are noted with adjacent atelectasis. There is again noted large hiatal hernia. Crescent shaped fluid collection is again noted around the hernia. One surgically placed drainage catheter has been removed from this area. The remaining surgically placed drainage catheter is unchanged in position, but the fluid is distal to the distal end of the catheter. Hepatobiliary: Status post cholecystectomy. 3.1 x 2.9 cm fluid collection remains inferior to falciform ligament. No focal abnormality is seen within the liver. Pancreas: Unremarkable. No pancreatic ductal dilatation or surrounding inflammatory changes. Spleen: Normal in size without focal abnormality. Adrenals/Urinary Tract: Adrenal glands are unremarkable. Kidneys are normal, without renal calculi, focal lesion, or hydronephrosis. Bladder is unremarkable. Stomach/Bowel: There is no evidence of bowel obstruction. Mild wall thickening of cecum and transverse colon is noted which may represent mild inflammation. Status post appendectomy. Vascular/Lymphatic: No significant vascular findings are present. No enlarged abdominal or pelvic lymph nodes. Reproductive: Uterus and bilateral adnexa are unremarkable. Other: There is been interval placement of right trans gluteal percutaneous drainage catheter. Fluid collection noted in pre  rectal space on prior exam is significantly smaller. Only a small amount of residual fluid remains, measuring 6.3 x 1.8 cm. Musculoskeletal: No acute or significant osseous findings. IMPRESSION: Moderate bilateral pleural effusions are noted with adjacent atelectasis. Large hiatal hernia is again noted. Crescent-shaped fluid collection is again noted around the hernia which is not significantly changed. One of the surgically placed drains seen within this has been removed in the interval. The remaining drain does not appear to be positioned within the fluid collection. 3 cm fluid collection is seen inferior to falciform ligament which is slightly smaller compared to prior exam. Interval placement of right trans gluteal percutaneous drainage catheter into pre rectal abscess or fluid collection. This fluid collection is significantly smaller compared to prior exam, with only a small amount of residual fluid remaining. Electronically Signed   By: JMarijo Conception M.D.   On: 09/24/2017 14:50    Medications / Allergies: per chart  Antibiotics: Anti-infectives    Start     Dose/Rate Route Frequency Ordered Stop   09/13/17 1200  piperacillin-tazobactam (ZOSYN) IVPB 3.375 g     3.375 g 12.5 mL/hr over 240 Minutes Intravenous Every 8 hours 09/13/17 0732  09/12/17 1500  anidulafungin (ERAXIS) 100 mg in sodium chloride 0.9 % 100 mL IVPB  Status:  Discontinued     100 mg 78 mL/hr over 100 Minutes Intravenous Every 24 hours 09/11/17 1448 09/11/17 1500   09/11/17 1500  anidulafungin (ERAXIS) 200 mg in sodium chloride 0.9 % 200 mL IVPB  Status:  Discontinued     200 mg 78 mL/hr over 200 Minutes Intravenous  Once 09/11/17 1448 09/11/17 1500   09/11/17 1400  anidulafungin (ERAXIS) 100 mg in sodium chloride 0.9 % 100 mL IVPB     100 mg 78 mL/hr over 100 Minutes Intravenous Every 24 hours 09/10/17 1246     09/11/17 0000  piperacillin-tazobactam (ZOSYN) IVPB 2.25 g  Status:  Discontinued     2.25 g 100 mL/hr  over 30 Minutes Intravenous Every 6 hours 09/10/17 1410 09/13/17 0732   09/10/17 1430  linezolid (ZYVOX) IVPB 600 mg  Status:  Discontinued     600 mg 300 mL/hr over 60 Minutes Intravenous Every 12 hours 09/10/17 1414 09/16/17 1356   09/10/17 1400  anidulafungin (ERAXIS) 200 mg in sodium chloride 0.9 % 200 mL IVPB     200 mg 78 mL/hr over 200 Minutes Intravenous  Once 09/10/17 1246 09/10/17 1651   09/10/17 1400  linezolid (ZYVOX) IVPB 600 mg  Status:  Discontinued     600 mg 300 mL/hr over 60 Minutes Intravenous Every 12 hours 09/10/17 1344 09/10/17 1414   09/09/17 1400  vancomycin (VANCOCIN) IVPB 1000 mg/200 mL premix  Status:  Discontinued     1,000 mg 200 mL/hr over 60 Minutes Intravenous Every 24 hours 09/09/17 1346 09/10/17 1344   09/09/17 1345  piperacillin-tazobactam (ZOSYN) IVPB 3.375 g  Status:  Discontinued     3.375 g 12.5 mL/hr over 240 Minutes Intravenous Every 8 hours 09/09/17 1344 09/10/17 1410   09/08/17 1000  vancomycin (VANCOCIN) IVPB 1000 mg/200 mL premix     1,000 mg 200 mL/hr over 60 Minutes Intravenous  Once 09/08/17 0946 09/08/17 1100   09/08/17 0950  ceFAZolin (ANCEF) 2-4 GM/100ML-% IVPB    Comments:  Bridget Hartshorn   : cabinet override      09/08/17 0950 09/08/17 1040   09/08/17 0947  vancomycin (VANCOCIN) 1-5 GM/200ML-% IVPB  Status:  Discontinued    Comments:  Bridget Hartshorn   : cabinet override      09/08/17 0947 09/08/17 0956   09/08/17 0941  vancomycin (VANCOCIN) 1-5 GM/200ML-% IVPB    Comments:  [place]   : cabinet override      09/08/17 0941 09/08/17 1000   09/08/17 0810  ceFAZolin (ANCEF) IVPB 2g/100 mL premix     2 g 200 mL/hr over 30 Minutes Intravenous On call to O.R. 09/08/17 0811 09/08/17 1050        Note: Portions of this report may have been transcribed using voice recognition software. Every effort was made to ensure accuracy; however, inadvertent computerized transcription errors may be present.   Any transcriptional errors that  result from this process are unintentional.     [name], M.D., F.A.C.S. Gastrointestinal and Minimally Invasive Surgery [facility] [address], [place], [place] [address] [phone] Main / Paging   09/25/2017

## 2017-09-25 NOTE — Progress Notes (Signed)
Referring Physician(s): Ramirez,A  Supervising Physician: Daryll Brod  Patient Status:  [facility] - In-pt  Chief Complaint:  Pelvic abscess  Subjective: Pt sitting up in bed. Family at bedside. Feels ok. Tolerating some solids Had CT yesterday    Allergies: Zithromax [azithromycin]; Aspirin; Ibuprofen; Iron; and Sulfa antibiotics  Medications:  Current Facility-Administered Medications:  .  0.9 %  sodium chloride infusion, 250 mL, Intravenous, PRN, Michael Boston, MD .  acetaminophen (TYLENOL) suppository 650 mg, 650 mg, Rectal, Q6H PRN, Michael Boston, MD .  acetaminophen (TYLENOL) tablet 325-650 mg, 325-650 mg, Oral, Q6H PRN, Michael Boston, MD .  albuterol (PROVENTIL) (2.5 MG/3ML) 0.083% nebulizer solution 2.5 mg, 2.5 mg, Nebulization, Q6H PRN, Ralene Ok, MD, 2.5 mg at 09/18/17 0451 .  alum & mag hydroxide-simeth (MAALOX/MYLANTA) 200-200-20 MG/5ML suspension 15 mL, 15 mL, Oral, Q4H PRN, Donne Hazel, MD, 15 mL at 09/20/17 0859 .  anidulafungin (ERAXIS) 100 mg in sodium chloride 0.9 % 100 mL IVPB, 100 mg, Intravenous, Q24H, Nestor, Sonia Baller, MD, Stopped at 09/24/17 1723 .  bismuth subsalicylate (PEPTO BISMOL) 262 MG/15ML suspension 30 mL, 30 mL, Oral, Q8H PRN, Michael Boston, MD .  chlorhexidine gluconate (MEDLINE KIT) (PERIDEX) 0.12 % solution 15 mL, 15 mL, Mouth Rinse, BID, Ashok Cordia, Sonia Baller, MD, 15 mL at 09/25/17 1021 .  Chlorhexidine Gluconate Cloth 2 % PADS 6 each, 6 each, Topical, Q0600, Ralene Ok, MD, 6 each at 09/25/17 0448 .  dextrose 50 % solution 50 mL, 1 ampule, Intravenous, Q6H PRN, Michael Boston, MD .  diazepam (VALIUM) tablet 5 mg, 5 mg, Oral, Q6H PRN, Ralene Ok, MD, 5 mg at 09/23/17 1447 .  diphenhydrAMINE (BENADRYL) capsule 25 mg, 25 mg, Oral, Q6H PRN, Michael Boston, MD .  diphenhydrAMINE (BENADRYL) injection 12.5-25 mg, 12.5-25 mg, Intravenous, Q6H PRN, Michael Boston, MD .  diphenoxylate-atropine (LOMOTIL) 2.5-0.025 MG per tablet 1  tablet, 1 tablet, Oral, QID, Ralene Ok, MD, 1 tablet at 09/25/17 1022 .  enoxaparin (LOVENOX) injection 40 mg, 40 mg, Subcutaneous, Q24H, Ralene Ok, MD, 40 mg at 09/25/17 1019 .  feeding supplement (BOOST / RESOURCE BREEZE) liquid 1 Container, 1 Container, Oral, TID BM, Ralene Ok, MD, 1 Container at 09/24/17 2038 .  glucose chewable tablet 4 g, 1 tablet, Oral, PRN, Ralene Ok, MD, 4 g at 09/23/17 2105 .  guaiFENesin-dextromethorphan (ROBITUSSIN DM) 100-10 MG/5ML syrup 5 mL, 5 mL, Oral, Q4H PRN, Ralene Ok, MD, 5 mL at 09/24/17 2038 .  hydrALAZINE (APRESOLINE) injection 5-20 mg, 5-20 mg, Intravenous, Q6H PRN, Michael Boston, MD .  HYDROcodone-acetaminophen (NORCO) 7.5-325 MG per tablet 1 tablet, 1 tablet, Oral, Q6H PRN, Florencia Reasons, MD, 1 tablet at 09/25/17 1252 .  hydrocortisone (ANUSOL-HC) 2.5 % rectal cream 1 application, 1 application, Topical, QID PRN, Michael Boston, MD .  hydrocortisone cream 1 % 1 application, 1 application, Topical, TID PRN, Michael Boston, MD .  insulin aspart (novoLOG) injection 0-9 Units, 0-9 Units, Subcutaneous, Q4H, Javier Glazier, MD, 1 Units at 09/23/17 1242 .  lactated ringers bolus 1,000 mL, 1,000 mL, Intravenous, Q8H PRN, Michael Boston, MD .  lactobacillus acidophilus & bulgar (LACTINEX) chewable tablet 2 tablet, 2 tablet, Oral, TID WC, Wofford, Drew A, RPH, 2 tablet at 09/25/17 1019 .  lip balm (CARMEX) ointment 1 application, 1 application, Topical, BID, Michael Boston, MD, 1 application at 81/82/99 1200 .  magic mouthwash, 15 mL, Oral, QID PRN, Michael Boston, MD, 15 mL at 09/25/17 1252 .  magnesium sulfate IVPB 4 g 100  mL, 4 g, Intravenous, Once, Michael Boston, MD, Last Rate: 50 mL/hr at 09/25/17 1152, 4 g at 09/25/17 1152 .  MEDLINE mouth rinse, 15 mL, Mouth Rinse, QID, Javier Glazier, MD, 15 mL at 09/25/17 0442 .  megestrol (MEGACE) 400 MG/10ML suspension 400 mg, 400 mg, Oral, Daily, Ralene Ok, MD, 400 mg at 09/25/17  1021 .  menthol-cetylpyridinium (CEPACOL) lozenge 3 mg, 1 lozenge, Oral, PRN, Michael Boston, MD .  methocarbamol (ROBAXIN) 1,000 mg in dextrose 5 % 50 mL IVPB, 1,000 mg, Intravenous, Q6H PRN, Michael Boston, MD .  naloxone [facility]) injection 0.4 mg, 0.4 mg, Intravenous, PRN, Ralene Ok, MD .  ondansetron (ZOFRAN-ODT) disintegrating tablet 4 mg, 4 mg, Oral, Q6H PRN **OR** ondansetron (ZOFRAN) injection 4 mg, 4 mg, Intravenous, Q6H PRN, Ralene Ok, MD, 4 mg at 09/20/17 0924 .  pantoprazole (PROTONIX) EC tablet 40 mg, 40 mg, Oral, Q1200, Michael Boston, MD, 40 mg at 09/25/17 1153 .  phenol (CHLORASEPTIC) mouth spray 1-2 spray, 1-2 spray, Mouth/Throat, PRN, Michael Boston, MD .  piperacillin-tazobactam (ZOSYN) IVPB 3.375 g, 3.375 g, Intravenous, Q8H, Pham, Anh P, RPH, Last Rate: 12.5 mL/hr at 09/25/17 1259, 3.375 g at 09/25/17 1259 .  potassium chloride 10 mEq in 100 mL IVPB, 10 mEq, Intravenous, Q1 Hr x 4, Gross, Steven, MD, Last Rate: 100 mL/hr at 09/25/17 1257, 10 mEq at 09/25/17 1257 .  potassium chloride SA (K-DUR,KLOR-CON) CR tablet 40 mEq, 40 mEq, Oral, BID, Gross, Remo Lipps, MD .  promethazine (PHENERGAN) injection 12.5 mg, 12.5 mg, Intravenous, D5H PRN, Leighton Ruff, MD, 29.9 mg at 09/24/17 1219 .  psyllium (HYDROCIL/METAMUCIL) packet 1 packet, 1 packet, Oral, Daily, Gross, Remo Lipps, MD .  sodium chloride flush (NS) 0.9 % injection 10 mL, 10 mL, Intravenous, Q8H, Aletta Edouard, MD, 10 mL at 09/24/17 1839 .  sodium chloride flush (NS) 0.9 % injection 10-40 mL, 10-40 mL, Intracatheter, Q12H, Ralene Ok, MD, 30 mL at 09/24/17 1001 .  sodium chloride flush (NS) 0.9 % injection 10-40 mL, 10-40 mL, Intracatheter, PRN, Ralene Ok, MD, 10 mL at 09/24/17 0512 .  sodium chloride flush (NS) 0.9 % injection 3 mL, 3 mL, Intravenous, Q12H, Gross, Remo Lipps, MD .  sodium chloride flush (NS) 0.9 % injection 3 mL, 3 mL, Intravenous, PRN, Michael Boston, MD .  spironolactone (ALDACTONE) tablet 50  mg, 50 mg, Oral, Daily, Gross, Remo Lipps, MD    Vital Signs: BP (!) 118/57 (BP Location: Left Arm)   Pulse 92   Temp 100.2 F (37.9 C) (Oral)   Resp 20   Ht 5' 4.5" (1.638 m)   Wt 141 lb 8.6 oz (64.2 kg)   SpO2 100%   BMI 23.92 kg/m   Physical Exam (R) TG drain intact, site NT,  output minimal blood-tinged fluid; cx- candida albicans/glabrata  Imaging: Ct Abdomen Pelvis W Contrast  Result Date: 09/24/2017 CLINICAL DATA:  Status post pelvic abscess drainage. EXAM: CT ABDOMEN AND PELVIS WITH CONTRAST TECHNIQUE: Multidetector CT imaging of the abdomen and pelvis was performed using the standard protocol following bolus administration of intravenous contrast. CONTRAST:  18m ISOVUE-300 IOPAMIDOL (ISOVUE-300) INJECTION 61% COMPARISON:  CT scan of September 16, 2017. FINDINGS: Lower chest: Moderate bilateral pleural effusions are noted with adjacent atelectasis. There is again noted large hiatal hernia. Crescent shaped fluid collection is again noted around the hernia. One surgically placed drainage catheter has been removed from this area. The remaining surgically placed drainage catheter is unchanged in position, but the fluid is distal to the distal end  of the catheter. Hepatobiliary: Status post cholecystectomy. 3.1 x 2.9 cm fluid collection remains inferior to falciform ligament. No focal abnormality is seen within the liver. Pancreas: Unremarkable. No pancreatic ductal dilatation or surrounding inflammatory changes. Spleen: Normal in size without focal abnormality. Adrenals/Urinary Tract: Adrenal glands are unremarkable. Kidneys are normal, without renal calculi, focal lesion, or hydronephrosis. Bladder is unremarkable. Stomach/Bowel: There is no evidence of bowel obstruction. Mild wall thickening of cecum and transverse colon is noted which may represent mild inflammation. Status post appendectomy. Vascular/Lymphatic: No significant vascular findings are present. No enlarged abdominal or pelvic  lymph nodes. Reproductive: Uterus and bilateral adnexa are unremarkable. Other: There is been interval placement of right trans gluteal percutaneous drainage catheter. Fluid collection noted in pre rectal space on prior exam is significantly smaller. Only a small amount of residual fluid remains, measuring 6.3 x 1.8 cm. Musculoskeletal: No acute or significant osseous findings. IMPRESSION: Moderate bilateral pleural effusions are noted with adjacent atelectasis. Large hiatal hernia is again noted. Crescent-shaped fluid collection is again noted around the hernia which is not significantly changed. One of the surgically placed drains seen within this has been removed in the interval. The remaining drain does not appear to be positioned within the fluid collection. 3 cm fluid collection is seen inferior to falciform ligament which is slightly smaller compared to prior exam. Interval placement of right trans gluteal percutaneous drainage catheter into pre rectal abscess or fluid collection. This fluid collection is significantly smaller compared to prior exam, with only a small amount of residual fluid remaining. Electronically Signed   By: Marijo Conception, M.D.   On: 09/24/2017 14:50    Labs:  CBC:  Recent Labs  09/22/17 0514 09/23/17 0415 09/24/17 0511 09/25/17 0455  WBC 18.1* 16.2* 16.3* 15.1*  HGB 8.3* 8.5* 8.1* 8.2*  HCT 25.4* 26.1* 24.8* 24.7*  PLT 178 279 358 455*    COAGS:  Recent Labs  05/06/17 2133 05/07/17 0244 09/17/17 1021  INR 1.18  --  1.24  APTT  --  21*  --     BMP:  Recent Labs  09/21/17 0800 09/22/17 0514 09/23/17 0415 09/24/17 0511 09/25/17 0455  NA 142 137 138  --  139  K 3.1* 3.1* 3.1*  --  2.8*  CL 116* 113* 113*  --  115*  CO2 18* 17* 15*  --  17*  GLUCOSE 90 83 78  --  109*  BUN _0 --  <5*  CALCIUM 7.7* 7.8* 7.9*  --  8.1*  CREATININE 1.40* 1.25* 1.15* 1.11* 1.18*  GFRNONAA 37* 43* 47* 49* 46*  GFRAA 43* 50* 55* 57* 53*    LIVER FUNCTION  TESTS:  Recent Labs  09/11/17 0509 09/13/17 0500 09/22/17 0514 09/25/17 0455  BILITOT 0.7 1.2 0.9 0.8  AST 45* _1 ALT 29 14 12* 10*  ALKPHOS 93 75 87 86  PROT 4.4* 5.7* 6.1* 7.0  ALBUMIN 1.8* 3.2* 2.2* 2.3*    Assessment and Plan: S/p drainage of pelvic cul de sac abscess 10/12; CT yesterday shows interval improvement of abscess collection, but not resolution IR cont follow.  Electronically Signed: Ascencion Dike, PA-C 09/25/2017, 1:38 PM   I spent a total of 15 minutes at the the patient's bedside AND on the patient's hospital floor or unit, greater than 50% of which was counseling/coordinating care for pelvic abscess drain    Patient ID: Danielle [name], female   DOB: 11/07/1946, 71 y.o.  MRN: [id number]

## 2017-09-25 NOTE — Plan of Care (Signed)
Problem: Safety: Goal: Ability to remain free from injury will improve Outcome: Progressing No safety issues noted  Problem: Pain Managment: Goal: General experience of comfort will improve Outcome: Progressing Requested pain medication earlier this shift then refused it  Problem: Tissue Perfusion: Goal: Risk factors for ineffective tissue perfusion will decrease Outcome: Progressing SCDs are on  Problem: Activity: Goal: Risk for activity intolerance will decrease Outcome: Progressing OOB to BSC and back to bed with minimum assistance and tolerated well

## 2017-09-25 NOTE — Progress Notes (Signed)
Hypoglycemic Event  CBG: 59 @1200   Treatment: 3 glucose tabs and 15 GM carbohydrate snack  Symptoms: None  Follow-up CBG: Time: 1245 CBG Result: 91 Possible Reasons for Event: Inadequate meal intake and Unknown  Comments/MD notified: Dr Lucia Gaskins and Dr. Johney Maine aware- see new orders per Ina Kick, Shelby Dubin

## 2017-09-25 NOTE — Progress Notes (Signed)
Patient has been refusing her Boost and Glucose tablets with her blood sugar running in the 80s and 70s, she has been alert, oriented, no visual signs of hypoglycemia, she is drinking Sprite with a lots of encouragement.

## 2017-09-25 NOTE — Progress Notes (Signed)
PROGRESS NOTE    Danielle [name]  OBS:[id number] DOB: 06-25-1946 DOA: 09/08/2017 PCP: Iona Beard, MD    Brief Narrative:  [phone] who presented for surgical correction of hiatal hernia. Pt developed septic shock post-operatively with purulent peritonitis. Initially followed by CCM. Hospitalist now following for medical management  Assessment & Plan:   Principal Problem:   Hiatal hernia Active Problems:   Hypomagnesemia   Perianal Crohn's disease, with fistula ([place])   Acute renal failure superimposed on stage 3 chronic kidney disease (HCC)   Immunosuppressed status (HCC)   Hypokalemia   Hiatal hernia with GERD   Rectovaginal fistula   Anorexia   Protein-calorie malnutrition, severe   Gastrostomy tube in place [facility])   History of repair of hiatal hernia   Peritonitis (HCC)   Fungemia   Encephalopathy acute   Anasarca   Electrolyte imbalance   Pressure injury of skin   Hypoglycemia  1. Acute hypoxic resp failure, post-operatively 1. Patient previously followed by CCM 2. Currently on minimal O2 support 3. Normal respiratory effort on exam 2. Acute metabolic encephalopathy 1. Now much improved 2. Remains alert and oriented 3. Anasarca 1. Suspect secondary to recent voluem resuscitation 2. S/p aggressive diuresis with IV lasix 3. Edema has improved significantly 4. Lasix was stopped stopped secondary to elevated Cr. 4. Acute on CKD3 1. Cr peaked at over 2 2. Have stopped IV lasix 3. Patient continued on gentle IVF 4. Cr continuing to improve 5. Repeat bmet in AM 5. Hypomagnesemia 1. 1.6 this AM 2. Will correct. Repeat level in AM 6. Hypokalemia 1. Will correct 2. Repeat bmet in AM 7. Peritonitis 1. Cont per CCM 2. Earlier had been on linezolid with zosyn. Linezolid had been stopped secondary to decreasing platelets 3. Patient is continued on abx per ID recs 8. Fungemia 1. Per ID, continue anidulafungin for now 2. Pelvic abscess has grown rare  yeast 9. Intra-abdominal fluid collection 1. Per IR, 8.7cm culdesac collection s/p drain 2. Culture growing rare yeast 3. IR recs for repeat CT later this week  DVT prophylaxis: SCD's Code Status: Full Family Communication: Pt in room, family not in room Disposition Plan: Uncertain at this time, per primary ( general surgery)  Consultants:   General surgery primary  interventional radiology/Infectious disease/hospital medicine consult  Procedures:  Exploratory laparotomy with takedown of G tube, repair gastrostomy abdominal washout (N/A) CT GUIDED CATHETER DRAINAGE OF PELVIC PERITONEAL ABSCESS by interventional radiology on 10/12  Antimicrobials: Anti-infectives    Start     Dose/Rate Route Frequency Ordered Stop   09/13/17 1200  piperacillin-tazobactam (ZOSYN) IVPB 3.375 g     3.375 g 12.5 mL/hr over 240 Minutes Intravenous Every 8 hours 09/13/17 0732     09/12/17 1500  anidulafungin (ERAXIS) 100 mg in sodium chloride 0.9 % 100 mL IVPB  Status:  Discontinued     100 mg 78 mL/hr over 100 Minutes Intravenous Every 24 hours 09/11/17 1448 09/11/17 1500   09/11/17 1500  anidulafungin (ERAXIS) 200 mg in sodium chloride 0.9 % 200 mL IVPB  Status:  Discontinued     200 mg 78 mL/hr over 200 Minutes Intravenous  Once 09/11/17 1448 09/11/17 1500   09/11/17 1400  anidulafungin (ERAXIS) 100 mg in sodium chloride 0.9 % 100 mL IVPB     100 mg 78 mL/hr over 100 Minutes Intravenous Every 24 hours 09/10/17 1246     09/11/17 0000  piperacillin-tazobactam (ZOSYN) IVPB 2.25 g  Status:  Discontinued     2.25  g 100 mL/hr over 30 Minutes Intravenous Every 6 hours 09/10/17 1410 09/13/17 0732   09/10/17 1430  linezolid (ZYVOX) IVPB 600 mg  Status:  Discontinued     600 mg 300 mL/hr over 60 Minutes Intravenous Every 12 hours 09/10/17 1414 09/16/17 1356   09/10/17 1400  anidulafungin (ERAXIS) 200 mg in sodium chloride 0.9 % 200 mL IVPB     200 mg 78 mL/hr over 200 Minutes Intravenous  Once 09/10/17  1246 09/10/17 1651   09/10/17 1400  linezolid (ZYVOX) IVPB 600 mg  Status:  Discontinued     600 mg 300 mL/hr over 60 Minutes Intravenous Every 12 hours 09/10/17 1344 09/10/17 1414   09/09/17 1400  vancomycin (VANCOCIN) IVPB 1000 mg/200 mL premix  Status:  Discontinued     1,000 mg 200 mL/hr over 60 Minutes Intravenous Every 24 hours 09/09/17 1346 09/10/17 1344   09/09/17 1345  piperacillin-tazobactam (ZOSYN) IVPB 3.375 g  Status:  Discontinued     3.375 g 12.5 mL/hr over 240 Minutes Intravenous Every 8 hours 09/09/17 1344 09/10/17 1410   09/08/17 1000  vancomycin (VANCOCIN) IVPB 1000 mg/200 mL premix     1,000 mg 200 mL/hr over 60 Minutes Intravenous  Once 09/08/17 0946 09/08/17 1100   09/08/17 0950  ceFAZolin (ANCEF) 2-4 GM/100ML-% IVPB    Comments:  Bridget Hartshorn   : cabinet override      09/08/17 0950 09/08/17 1040   09/08/17 0947  vancomycin (VANCOCIN) 1-5 GM/200ML-% IVPB  Status:  Discontinued    Comments:  Bridget Hartshorn   : cabinet override      09/08/17 0947 09/08/17 0956   09/08/17 0941  vancomycin (VANCOCIN) 1-5 GM/200ML-% IVPB    Comments:  [place], Ron   : cabinet override      09/08/17 0941 09/08/17 1000   09/08/17 0810  ceFAZolin (ANCEF) IVPB 2g/100 mL premix     2 g 200 mL/hr over 30 Minutes Intravenous On call to O.R. 09/08/17 0811 09/08/17 1050      Subjective:  Sitting on bedside commode having diarrhea, reports chronic diarrhea, denies abdominal pain, no n/v.   Objective: Vitals:   09/24/17 2210 09/25/17 0400 09/25/17 0522 09/25/17 1436  BP: 115/70  (!) 118/57 108/71  Pulse: 96  92 79  Resp: 20  20 (!) 24  Temp: 100 F (37.8 C)  100.2 F (37.9 C) 98.3 F (36.8 C)  TempSrc: Oral  Oral Oral  SpO2: 100%  100% 100%  Weight:  64.2 kg (141 lb 8.6 oz)    Height:        Intake/Output Summary (Last 24 hours) at 09/25/17 1842 Last data filed at 09/25/17 1436  Gross per 24 hour  Intake             1020 ml  Output              855 ml  Net               165 ml   Filed Weights   09/21/17 0500 09/22/17 0500 09/25/17 0400  Weight: 62 kg (136 lb 11 oz) 63.5 kg (139 lb 15.9 oz) 64.2 kg (141 lb 8.6 oz)    Examination: General exam: better, sitting up in chair, NAD Respiratory system: Normal respiratory effort, no wheezing Cardiovascular system: regular rate, s1, s2 Gastrointestinal system: post op changes, dressing in place, + drain, positive BS Central nervous system: CN2-12 grossly intact, strength intact Extremities: Perfused, no clubbing Skin: Normal skin turgor, no  notable skin lesions seen Psychiatry: Mood normal // no visual hallucinations   Data Reviewed: I have personally reviewed following labs and imaging studies  CBC:  Recent Labs Lab 09/21/17 0500 09/22/17 0514 09/23/17 0415 09/24/17 0511 09/25/17 0455  WBC 18.1* 18.1* 16.2* 16.3* 15.1*  NEUTROABS 10.2* 9.6* 9.6* 9.7* 8.1*  HGB 8.7* 8.3* 8.5* 8.1* 8.2*  HCT 27.2* 25.4* 26.1* 24.8* 24.7*  MCV 88.9 88.2 89.1 88.3 87.0  PLT 90* 178 279 358 142*   Basic Metabolic Panel:  Recent Labs Lab 09/20/17 0331  09/21/17 0800 09/22/17 0514 09/23/17 0415 09/24/17 0511 09/25/17 0455  NA 140  --  142 137 138  --  139  K 3.2*  --  3.1* 3.1* 3.1*  --  2.8*  CL 110  --  116* 113* 113*  --  115*  CO2 19*  --  18* 17* 15*  --  17*  GLUCOSE 115*  --  90 83 78  --  109*  BUN 17  --  _0 --  <5*  CREATININE 1.76*  < > 1.40* 1.25* 1.15* 1.11* 1.18*  CALCIUM 7.6*  --  7.7* 7.8* 7.9*  --  8.1*  MG  --   --  1.6* 1.9 1.7  --  1.5*  < > = values in this interval not displayed. GFR: Estimated Creatinine Clearance: 39.7 mL/min (A) (by C-G formula based on SCr of 1.18 mg/dL (H)). Liver Function Tests:  Recent Labs Lab 09/22/17 0514 09/25/17 0455  AST 20 16  ALT 12* 10*  ALKPHOS 87 86  BILITOT 0.9 0.8  PROT 6.1* 7.0  ALBUMIN 2.2* 2.3*   No results for input(s): LIPASE, AMYLASE in the last 168 hours. No results for input(s): AMMONIA in the last 168 hours. Coagulation  Profile: No results for input(s): INR, PROTIME in the last 168 hours. Cardiac Enzymes: No results for input(s): CKTOTAL, CKMB, CKMBINDEX, TROPONINI in the last 168 hours. BNP (last 3 results) No results for input(s): PROBNP in the last 8760 hours. HbA1C: No results for input(s): HGBA1C in the last 72 hours. CBG:  Recent Labs Lab 09/25/17 0759 09/25/17 0844 09/25/17 1157 09/25/17 1245 09/25/17 1614  GLUCAP 64* 70 59* 91 82   Lipid Profile: No results for input(s): CHOL, HDL, LDLCALC, TRIG, CHOLHDL, LDLDIRECT in the last 72 hours. Thyroid Function Tests: No results for input(s): TSH, T4TOTAL, FREET4, T3FREE, THYROIDAB in the last 72 hours. Anemia Panel: No results for input(s): VITAMINB12, FOLATE, FERRITIN, TIBC, IRON, RETICCTPCT in the last 72 hours. Sepsis Labs: No results for input(s): PROCALCITON, LATICACIDVEN in the last 168 hours.  Recent Results (from the past 240 hour(s))  C difficile quick scan w PCR reflex     Status: None   Collection Time: 09/16/17  2:15 PM  Result Value Ref Range Status   C Diff antigen NEGATIVE NEGATIVE Final   C Diff toxin NEGATIVE NEGATIVE Final   C Diff interpretation No C. difficile detected.  Final  Aerobic/Anaerobic Culture (surgical/deep wound)     Status: None   Collection Time: 09/17/17  7:30 PM  Result Value Ref Range Status   Specimen Description ABSCESS PELVIC  Final   Special Requests NONE  Final   Gram Stain   Final    ABUNDANT WBC PRESENT, PREDOMINANTLY PMN RARE YEAST    Culture   Final    MODERATE CANDIDA GLABRATA MODERATE CANDIDA ALBICANS NO ANAEROBES ISOLATED Performed at [facility], [place] [address] [place], [place] [address]  Report Status 09/23/2017 FINAL  Final     Radiology Studies: Ct Abdomen Pelvis W Contrast  Result Date: 09/24/2017 CLINICAL DATA:  Status post pelvic abscess drainage. EXAM: CT ABDOMEN AND PELVIS WITH CONTRAST TECHNIQUE: Multidetector CT imaging of the abdomen and pelvis was  performed using the standard protocol following bolus administration of intravenous contrast. CONTRAST:  167m ISOVUE-300 IOPAMIDOL (ISOVUE-300) INJECTION 61% COMPARISON:  CT scan of September 16, 2017. FINDINGS: Lower chest: Moderate bilateral pleural effusions are noted with adjacent atelectasis. There is again noted large hiatal hernia. Crescent shaped fluid collection is again noted around the hernia. One surgically placed drainage catheter has been removed from this area. The remaining surgically placed drainage catheter is unchanged in position, but the fluid is distal to the distal end of the catheter. Hepatobiliary: Status post cholecystectomy. 3.1 x 2.9 cm fluid collection remains inferior to falciform ligament. No focal abnormality is seen within the liver. Pancreas: Unremarkable. No pancreatic ductal dilatation or surrounding inflammatory changes. Spleen: Normal in size without focal abnormality. Adrenals/Urinary Tract: Adrenal glands are unremarkable. Kidneys are normal, without renal calculi, focal lesion, or hydronephrosis. Bladder is unremarkable. Stomach/Bowel: There is no evidence of bowel obstruction. Mild wall thickening of cecum and transverse colon is noted which may represent mild inflammation. Status post appendectomy. Vascular/Lymphatic: No significant vascular findings are present. No enlarged abdominal or pelvic lymph nodes. Reproductive: Uterus and bilateral adnexa are unremarkable. Other: There is been interval placement of right trans gluteal percutaneous drainage catheter. Fluid collection noted in pre rectal space on prior exam is significantly smaller. Only a small amount of residual fluid remains, measuring 6.3 x 1.8 cm. Musculoskeletal: No acute or significant osseous findings. IMPRESSION: Moderate bilateral pleural effusions are noted with adjacent atelectasis. Large hiatal hernia is again noted. Crescent-shaped fluid collection is again noted around the hernia which is not  significantly changed. One of the surgically placed drains seen within this has been removed in the interval. The remaining drain does not appear to be positioned within the fluid collection. 3 cm fluid collection is seen inferior to falciform ligament which is slightly smaller compared to prior exam. Interval placement of right trans gluteal percutaneous drainage catheter into pre rectal abscess or fluid collection. This fluid collection is significantly smaller compared to prior exam, with only a small amount of residual fluid remaining. Electronically Signed   By: JMarijo Conception M.D.   On: 09/24/2017 14:50    Scheduled Meds: . chlorhexidine gluconate (MEDLINE KIT)  15 mL Mouth Rinse BID  . Chlorhexidine Gluconate Cloth  6 each Topical Q0600  . diphenoxylate-atropine  1 tablet Oral QID  . enoxaparin (LOVENOX) injection  40 mg Subcutaneous Q24H  . feeding supplement  1 Container Oral TID BM  . insulin aspart  0-9 Units Subcutaneous Q4H  . lactobacillus acidophilus & bulgar  2 tablet Oral TID WC  . lip balm  1 application Topical BID  . mouth rinse  15 mL Mouth Rinse QID  . megestrol  400 mg Oral Daily  . pantoprazole  40 mg Oral Q1200  . potassium chloride  40 mEq Oral BID  . psyllium  1 packet Oral Daily  . sodium chloride flush  10 mL Intravenous Q8H  . sodium chloride flush  10-40 mL Intracatheter Q12H  . sodium chloride flush  3 mL Intravenous Q12H  . spironolactone  50 mg Oral Daily   Continuous Infusions: . sodium chloride    . anidulafungin 100 mg (09/25/17 1540)  . lactated ringers    .  methocarbamol (ROBAXIN)  IV    . piperacillin-tazobactam (ZOSYN)  IV 3.375 g (09/25/17 1259)     LOS: 16 days   Beata Beason, MD PhD [facility] Pager [phone]  If 7PM-7AM, please contact night-coverage [email or web address] Password [email or web address] 09/25/2017, 6:42 PM

## 2017-09-25 NOTE — Progress Notes (Signed)
Dressings changed to abdominal wound and right buttock JP drain, small amount old dark dry  brown drainage noted from old dressing. Site clean with NS and covered  With split gauge, no odor noted, abdominal wound looks beefy red. Scant amount of pus noted from mid incision and cleaned with NS, covered with W-D gauze and NS and 1 ABD pad with hypofix tape, odor noted, patient tolerated well

## 2017-09-25 NOTE — Progress Notes (Addendum)
Hypoglycemic Event  CBG: CBG 64 at 0800  Treatment: 15 GM carbohydrate snack  Symptoms: None  Follow-up CBG: Time: 5844 CBG Result: 70  Possible Reasons for Event: Inadequate meal intake  Comments/MD notified: dr. Lucia Gaskins aware    Derek Jack

## 2017-09-26 DIAGNOSIS — Z23 Encounter for immunization: Secondary | ICD-10-CM | POA: Diagnosis not present

## 2017-09-26 LAB — BASIC METABOLIC PANEL
Anion gap: 8 (ref 5–15)
CO2: 16 mmol/L — ABNORMAL LOW (ref 22–32)
CREATININE: 1.13 mg/dL — AB (ref 0.44–1.00)
Calcium: 8.3 mg/dL — ABNORMAL LOW (ref 8.9–10.3)
Chloride: 114 mmol/L — ABNORMAL HIGH (ref 101–111)
GFR, EST AFRICAN AMERICAN: 56 mL/min — AB (ref >60)
GFR, EST NON AFRICAN AMERICAN: 48 mL/min — AB (ref >60)
Glucose, Bld: 107 mg/dL — ABNORMAL HIGH (ref 65–99)
POTASSIUM: 3.4 mmol/L — AB (ref 3.5–5.1)
SODIUM: 138 mmol/L (ref 135–145)

## 2017-09-26 LAB — GLUCOSE, CAPILLARY
GLUCOSE-CAPILLARY: 62 mg/dL — AB (ref 65–99)
GLUCOSE-CAPILLARY: 95 mg/dL (ref 65–99)
GLUCOSE-CAPILLARY: 77 mg/dL (ref 65–99)
GLUCOSE-CAPILLARY: 70 mg/dL (ref 65–99)
Glucose-Capillary: 61 mg/dL — ABNORMAL LOW (ref 65–99)
Glucose-Capillary: 63 mg/dL — ABNORMAL LOW (ref 65–99)
Glucose-Capillary: 178 mg/dL — ABNORMAL HIGH (ref 65–99)
Glucose-Capillary: 59 mg/dL — ABNORMAL LOW (ref 65–99)
Glucose-Capillary: 68 mg/dL (ref 65–99)

## 2017-09-26 LAB — MAGNESIUM: MAGNESIUM: 2.2 mg/dL (ref 1.7–2.4)

## 2017-09-26 MED ORDER — HYDROCODONE-ACETAMINOPHEN 7.5-325 MG PO TABS: 1.0000 | ORAL_TABLET | Freq: Four times a day (QID) | ORAL | 0 refills | Status: AC | PRN

## 2017-09-26 MED ORDER — HEPARIN SOD (PORK) LOCK FLUSH 100 UNIT/ML IV SOLN
500.0000 [IU] | Freq: Every day | INTRAVENOUS | Status: DC
  Administered 2017-09-26: 500 [IU]

## 2017-09-26 MED ORDER — ERTAPENEM IV (FOR PTA / DISCHARGE USE ONLY): 1.0000 g | INTRAVENOUS | 0 refills | Status: DC

## 2017-09-26 MED ORDER — SODIUM CHLORIDE 0.9 % IV SOLN
1.0000 g | INTRAVENOUS | Status: DC
  Administered 2017-09-26: 1 g via INTRAVENOUS
  Filled 2017-09-26: qty 1

## 2017-09-26 MED ORDER — ANIDULAFUNGIN IV (FOR PTA / DISCHARGE USE ONLY): 100.0000 mg | INTRAVENOUS | 0 refills | Status: DC

## 2017-09-26 MED ORDER — HEPARIN SOD (PORK) LOCK FLUSH 100 UNIT/ML IV SOLN
250.0000 [IU] | INTRAVENOUS | Status: DC | PRN
  Filled 2017-09-26: qty 2.5

## 2017-09-26 NOTE — Progress Notes (Signed)
Assessment unchanged. Pt and daughter verbalized understanding of dc instructions including medications and activity to resume, follow up care, and when to call the doctor. Daughter and patient instructed how to change abd dressing if home health nurse isn't around, how to flush drain as well as empty and charge JP daily, and supplies provided. PICC care instructions also provided by RN and IV team RN. Both verbalized understanding through teach back. Home Health to follow pt starting Monday. Discharged via wc to front entrance to meet awaiting vehicle to carry home. Accompanied by daughter and NT.

## 2017-09-26 NOTE — Progress Notes (Signed)
Hypoglycemic Event  CBG: 59 @1550   Treatment: 3 glucose tabs  Symptoms: None  Follow-up CBG: Time:  1611            CBG Result: 68  --15 GM carb snack given  CBG up to 70 at 1638 --Pt remains asymptomatic stating "I'm going home to have a big bowl of potato soup"  Possible Reasons for Event: Unknown  Comments/MD notified: Recently spoke with Dr. Johney Maine regarding dc home. No new orders received.                                             Derek Jack

## 2017-09-26 NOTE — Progress Notes (Signed)
Dressing changed to abdominal wound, wound look beefy red, scant amount of serosanguinous noted from old dressing, no odor noted, wet to dry gauzes applied and covered with 2 4x4 and half ABD pad, patient tolerated well.

## 2017-09-26 NOTE — Progress Notes (Signed)
Hypoglycemic Event  CBG: 61 at 0815  Treatment: 3 glucose tabs  Symptoms: None  Follow-up CBG: Time: 0828 CBG Result: 63  Tx: 1 amp of D50 given  @ 0850 CBG up to 178  Possible Reasons for Event: Inadequate meal intake  Comments/MD notified: Prn orders followed per Dr. Ina Kick, Shelby Dubin

## 2017-09-26 NOTE — Progress Notes (Signed)
Patient is discharged by general surgery. hospitalist sign off.

## 2017-09-26 NOTE — Discharge Summary (Signed)
Physician Discharge Summary  Patient ID: LAURANNE BEYERSDORF MRN: [id number] DOB/AGE: May 28, 1946  71 y.o.  Admit date: 09/08/2017 Discharge date: 09/26/2017   Patient Care Team: Iona Beard, MD as PCP - General (Family Medicine) Teena Irani, MD as Consulting Physician (Gastroenterology) Rogene Houston, MD as Consulting Physician (Gastroenterology) Burke Keels, MD (Inactive) as Consulting Physician (General Surgery) Leighton Ruff, MD as Consulting Physician (General Surgery) Ralene Ok, MD as Consulting Physician (General Surgery)  Discharge Diagnoses:  Principal Problem:   Hiatal hernia Active Problems:   Perianal Crohn's disease, with fistula ([place])   Hypomagnesemia   Acute renal failure superimposed on stage 3 chronic kidney disease ([place])   Immunosuppressed status ([place])   Hypokalemia   Hiatal hernia with GERD   Rectovaginal fistula   Anorexia   Protein-calorie malnutrition, severe   Gastrostomy tube in place [facility])   History of repair of hiatal hernia   Peritonitis ([place])   Fungemia   Encephalopathy acute   Anasarca   Electrolyte imbalance   Pressure injury of skin   Hypoglycemia  09/08/2017   POST-OPERATIVE DIAGNOSIS: Type 4 paraesophageal hiatal hernia  PROCEDURE:  DIAGNOSTIC LAPAROSCOPIC PLACEMENT OF G-TUBE AND GASTROPEXY (N/A) LAPAROSCOPIC LYSIS OF ADHESIONS45 minutes(N/A)  SURGEON: Ralene Ok, MD - Primary  15 Days Post-Op  09/10/2017  Procedure performed: exploratory laparotomy, takedown of gastrostomy and repair of gastrotomy, washout and placement of mediastinal drains, hepatorrhaphy x 2  Post-op diagnosis/intraop findings: moderate volume pneumoperitoneum and purulent peritonitis, possible leakage at g tube site  Consults: None  Hospital Course:   The patient underwent surgery for a giant type IV paraesophageal hiatal hernia.  Four jejunal abdominal pain and leaking around the gastrostomy tube requiring reoperation.  She  required intubation and pressor support.  Critical care consultation.  Had purulent peritonitis.  She is intubated with consultations to help manage her.  Extubated on postoperative day two.  Weaned off pressors.  Initially tolerated some tube feeds.  Ileus eventually resolved.  Diet advanced.  She developed a pelvic fluid collection.  Drain in place.  Blood cultures and drain grew out Candida.  Albicans and clip brought up.  Infectious disease recommended Zosyn and antifungal regimen.  Cultures were pending still at the time of discharge.  Diet gradually advanced and she was able to wean off IV parenteral nutrition.  By the time of discharge, the patient was walking with a help of physical therapy eating food, having flatus.  Pain was well-controlled on an oral medications.  Based on meeting discharge criteria and continuing to recover, I felt it was safe for the patient to be discharged from the hospital to further recover with close followup. Postoperative recommendations were discussed in detail.  They are written as well.  Recommend continued dressing changes to her midline incision on a daily basis.  Keep her PICC line.  Continue IV antibiotics.  Per Dr. Johnnye Sima with infectious disease, switch to IV Invanz and anidulafungin for at least two more weeks.  Perhaps adjust based on final culturing.  Therapy nursing felt it was reasonable for her to go home with a help of the patient's sister and home health physical therapy.   Discharged Condition: fair  Disposition:  Follow-up Information    Ralene Ok, MD Follow up in 2 week(s).   Specialty:  General Surgery Why:  For wound re-check Contact information: [address] [place] [address] [phone]        [facility]. Call.   Specialty:  Radiology Why:  call with questions about your drain Contact information: [id number] [place]  [address] [phone]       Campbell Riches, MD. Call.   Specialty:  Infectious Diseases Why:  as neded to discuss need for furhter antibiotics Contact information: [address] [place] [address] [phone]           01-Home or Self Care  Discharge Instructions    Call MD for:    Complete by:  As directed    FEVER > 101.5 F (Temperatures <101.53F can occasionally happen and are not significant)   Call MD for:  extreme fatigue    Complete by:  As directed    Call MD for:  persistant dizziness or light-headedness    Complete by:  As directed    Call MD for:  persistant nausea and vomiting    Complete by:  As directed    Call MD for:  redness, tenderness, or signs of infection (pain, swelling, redness, odor or green/yellow discharge around incision site)    Complete by:  As directed    Call MD for:  severe uncontrolled pain    Complete by:  As directed    Diet - low sodium heart healthy    Complete by:  As directed    Follow a light diet the first few days at home.    If you feel full, bloated, or constipated, stay on a liquid diet until you feel better and not constipated. Gradually get back to a solid diet.  Avoid fast food or heavy meals the first week as you are more likely to get nauseated. It is expected for your digestive tract to need a few months to get back to normal.   Discharge wound care:    Complete by:  As directed    You have an open wound. If the wound is being packed, see wound care instructions.    In general, it is encouraged that when you remove your dressing and packing; then, shower with soap & water, and replace your dressing once a day.   Pack the wound with clean gauze moistened with normal (0.9%) saline to keep the wound moist & uninfected.    Pressure on the dressing for 30 minutes will stop most wound bleeding.   Eventually your body will heal & pull the open wound closed over the next few months.  Raw open wounds will  occasionally bleed or secrete yellow drainage until it heals closed.   Drain sites will drain a little until the drain is removed.   Even closed incisions can have mild bleeding or drainage the first few days until the skin edges scab over & seal.   Driving Restrictions    Complete by:  As directed    You may drive when you are no longer taking narcotic prescription pain medication, you can comfortably wear a seatbelt, and you can safely make sudden turns/stops to protect yourself without hesitating due to pain.   Increase activity slowly    Complete by:  As directed    Lifting restrictions    Complete by:  As directed    Start light daily activities --- self-care, walking, climbing stairs- beginning the day after surgery.   Gradually increase activities as tolerated.   Control your pain to be active.   Stop when you are tired.   Ideally, walk several times a day, eventually an hour a day.   Most  people are back to most day-to-day activities in a few weeks.  It takes 4-8 weeks to get back to unrestricted, intense activity. If you can walk 30 minutes without difficulty, it is safe to try more intense activity such as jogging, treadmill, bicycling, low-impact aerobics, swimming, etc. Save the most intensive and strenuous activity for last (Usually 4-8 weeks after surgery) such as sit-ups, heavy lifting, contact sports, etc.   Refrain from any intense heavy lifting or straining until you are off narcotics for pain control.  You will have off days, but things should improve week-by-week. DO NOT PUSH THROUGH PAIN.   Let pain be your guide: If it hurts to do something, don't do it.  Pain is your body warning you to avoid that activity for another week until the pain goes down.   May shower / Bathe    Complete by:  As directed    May walk up steps    Complete by:  As directed    Sexual Activity Restrictions    Complete by:  As directed    You may have sexual intercourse when it is comfortable. If  it hurts to do something, stop.      Allergies as of 09/26/2017      Reactions   Zithromax [azithromycin] Swelling, Other (See Comments)   Reaction:  Mouth swelling and blisters    Aspirin Other (See Comments)   Reaction:  Stomach cramping    Ibuprofen Other (See Comments)   Pt states that she prefers not to take this medication.    Iron Nausea And Vomiting   FERROUS SULFATE   Sulfa Antibiotics Nausea And Vomiting      Medication List    TAKE these medications   albuterol 108 (90 Base) MCG/ACT inhaler Commonly known as:  PROVENTIL HFA;VENTOLIN HFA Inhale 2 puffs into the lungs every 6 (six) hours as needed for wheezing.   capsicum oleoresin 0.025 % cream Commonly known as:  TRIXAICIN Apply topically 3 (three) times daily. What changed:  how much to take  when to take this  reasons to take this   cyanocobalamin 1000 MCG/ML injection Commonly known as:  (VITAMIN B-12) INJECT 1000 MCG IM ONCE MONTHLY   dicyclomine 10 MG/5ML syrup Commonly known as:  BENTYL TAKE 10 MLS (20 MG TOTAL) BY MOUTH 3 (THREE) TIMES DAILY AS NEEDED. What changed:  reasons to take this   diphenoxylate-atropine 2.5-0.025 MG tablet Commonly known as:  LOMOTIL TAKE 1 TABLET BY MOUTH FOUR TIMES A DAY AS NEEDED What changed:  See the new instructions.   furosemide 20 MG tablet Commonly known as:  LASIX Take 1 tablet (20 mg total) by mouth daily as needed (Take for weight gain of more than 2 pounds in a day or 5 pounds in 2 days.).   HYDROcodone-acetaminophen 7.5-325 MG tablet Commonly known as:  NORCO Take 1 tablet by mouth every 6 (six) hours as needed for moderate pain or severe pain. What changed:  when to take this  reasons to take this   nystatin powder Generic drug:  nystatin Apply 1 g topically 2 (two) times daily as needed (for itching/irritation).   pantoprazole 40 MG tablet Commonly known as:  PROTONIX Take 1 tablet (40 mg total) by mouth daily.   PHAZYME PO Take 1 tablet  by mouth daily as needed (gas).   sodium bicarbonate 650 MG tablet TAKE 1 TABLET (650 MG TOTAL) BY MOUTH 2 (TWO) TIMES DAILY.   spironolactone 50 MG tablet Commonly known  as:  ALDACTONE Take 50 mg by mouth daily.            Discharge Care Instructions        Start     Ordered   09/26/17 0000  Discharge wound care:    Comments:  You have an open wound. If the wound is being packed, see wound care instructions.    In general, it is encouraged that when you remove your dressing and packing; then, shower with soap & water, and replace your dressing once a day.   Pack the wound with clean gauze moistened with normal (0.9%) saline to keep the wound moist & uninfected.    Pressure on the dressing for 30 minutes will stop most wound bleeding.   Eventually your body will heal & pull the open wound closed over the next few months.  Raw open wounds will occasionally bleed or secrete yellow drainage until it heals closed.   Drain sites will drain a little until the drain is removed.   Even closed incisions can have mild bleeding or drainage the first few days until the skin edges scab over & seal.   09/26/17 1037      Significant Diagnostic Studies:  Results for orders placed or performed during the hospital encounter of 09/08/17 (from the past 72 hour(s))  Glucose, capillary     Status: Abnormal   Collection Time: 09/23/17 11:43 AM  Result Value Ref Range   Glucose-Capillary 148 (H) 65 - 99 mg/dL  Glucose, capillary     Status: None   Collection Time: 09/23/17  5:15 PM  Result Value Ref Range   Glucose-Capillary 77 65 - 99 mg/dL  Glucose, capillary     Status: Abnormal   Collection Time: 09/23/17  8:15 PM  Result Value Ref Range   Glucose-Capillary 59 (L) 65 - 99 mg/dL   Comment 1 Notify RN    Comment 2 Document in Chart   Glucose, capillary     Status: None   Collection Time: 09/23/17  9:52 PM  Result Value Ref Range   Glucose-Capillary 67 65 - 99 mg/dL   Comment 1 Notify  RN    Comment 2 Document in Chart   Glucose, capillary     Status: None   Collection Time: 09/23/17 11:58 PM  Result Value Ref Range   Glucose-Capillary 70 65 - 99 mg/dL   Comment 1 Notify RN    Comment 2 Document in Chart   Glucose, capillary     Status: None   Collection Time: 09/24/17  3:59 AM  Result Value Ref Range   Glucose-Capillary 70 65 - 99 mg/dL   Comment 1 Notify RN    Comment 2 Document in Chart   Creatinine, serum     Status: Abnormal   Collection Time: 09/24/17  5:11 AM  Result Value Ref Range   Creatinine, Ser 1.11 (H) 0.44 - 1.00 mg/dL   GFR calc non Af Amer 49 (L) >60 mL/min   GFR calc Af Amer 57 (L) >60 mL/min    Comment: (NOTE) The eGFR has been calculated using the CKD EPI equation. This calculation has not been validated in all clinical situations. eGFR's persistently <60 mL/min signify possible Chronic Kidney Disease.   CBC with Differential/Platelet     Status: Abnormal   Collection Time: 09/24/17  5:11 AM  Result Value Ref Range   WBC 16.3 (H) 4.0 - 10.5 K/uL   RBC 2.81 (L) 3.87 - 5.11 MIL/uL   Hemoglobin  8.1 (L) 12.0 - 15.0 g/dL   HCT 24.8 (L) 36.0 - 46.0 %   MCV 88.3 78.0 - 100.0 fL   MCH 28.8 26.0 - 34.0 pg   MCHC 32.7 30.0 - 36.0 g/dL   RDW 15.5 11.5 - 15.5 %   Platelets 358 150 - 400 K/uL   Neutrophils Relative % 60 %   Neutro Abs 9.7 (H) 1.7 - 7.7 K/uL   Lymphocytes Relative 24 %   Lymphs Abs 4.0 0.7 - 4.0 K/uL   Monocytes Relative 14 %   Monocytes Absolute 2.3 (H) 0.1 - 1.0 K/uL   Eosinophils Relative 2 %   Eosinophils Absolute 0.3 0.0 - 0.7 K/uL   Basophils Relative 0 %   Basophils Absolute 0.0 0.0 - 0.1 K/uL  Glucose, capillary     Status: None   Collection Time: 09/24/17  8:04 AM  Result Value Ref Range   Glucose-Capillary 83 65 - 99 mg/dL  Glucose, capillary     Status: Abnormal   Collection Time: 09/24/17  1:25 PM  Result Value Ref Range   Glucose-Capillary 61 (L) 65 - 99 mg/dL  Glucose, capillary     Status: Abnormal    Collection Time: 09/24/17  4:09 PM  Result Value Ref Range   Glucose-Capillary 57 (L) 65 - 99 mg/dL  Glucose, capillary     Status: None   Collection Time: 09/24/17  5:37 PM  Result Value Ref Range   Glucose-Capillary 79 65 - 99 mg/dL  Glucose, capillary     Status: None   Collection Time: 09/24/17  7:55 PM  Result Value Ref Range   Glucose-Capillary 80 65 - 99 mg/dL  Glucose, capillary     Status: None   Collection Time: 09/24/17 11:53 PM  Result Value Ref Range   Glucose-Capillary 72 65 - 99 mg/dL  Glucose, capillary     Status: None   Collection Time: 09/25/17  3:44 AM  Result Value Ref Range   Glucose-Capillary 71 65 - 99 mg/dL  CBC with Differential/Platelet     Status: Abnormal   Collection Time: 09/25/17  4:55 AM  Result Value Ref Range   WBC 15.1 (H) 4.0 - 10.5 K/uL   RBC 2.84 (L) 3.87 - 5.11 MIL/uL   Hemoglobin 8.2 (L) 12.0 - 15.0 g/dL   HCT 24.7 (L) 36.0 - 46.0 %   MCV 87.0 78.0 - 100.0 fL   MCH 28.9 26.0 - 34.0 pg   MCHC 33.2 30.0 - 36.0 g/dL   RDW 15.4 11.5 - 15.5 %   Platelets 455 (H) 150 - 400 K/uL   Neutrophils Relative % 54 %   Neutro Abs 8.1 (H) 1.7 - 7.7 K/uL   Lymphocytes Relative 32 %   Lymphs Abs 4.8 (H) 0.7 - 4.0 K/uL   Monocytes Relative 12 %   Monocytes Absolute 1.8 (H) 0.1 - 1.0 K/uL   Eosinophils Relative 2 %   Eosinophils Absolute 0.3 0.0 - 0.7 K/uL   Basophils Relative 0 %   Basophils Absolute 0.1 0.0 - 0.1 K/uL  Comprehensive metabolic panel     Status: Abnormal   Collection Time: 09/25/17  4:55 AM  Result Value Ref Range   Sodium 139 135 - 145 mmol/L   Potassium 2.8 (L) 3.5 - 5.1 mmol/L   Chloride 115 (H) 101 - 111 mmol/L   CO2 17 (L) 22 - 32 mmol/L   Glucose, Bld 109 (H) 65 - 99 mg/dL   BUN <5 (L) 6 - 20  mg/dL   Creatinine, Ser 1.18 (H) 0.44 - 1.00 mg/dL   Calcium 8.1 (L) 8.9 - 10.3 mg/dL   Total Protein 7.0 6.5 - 8.1 g/dL   Albumin 2.3 (L) 3.5 - 5.0 g/dL   AST 16 15 - 41 U/L   ALT 10 (L) 14 - 54 U/L   Alkaline Phosphatase 86 38 -  126 U/L   Total Bilirubin 0.8 0.3 - 1.2 mg/dL   GFR calc non Af Amer 46 (L) >60 mL/min   GFR calc Af Amer 53 (L) >60 mL/min    Comment: (NOTE) The eGFR has been calculated using the CKD EPI equation. This calculation has not been validated in all clinical situations. eGFR's persistently <60 mL/min signify possible Chronic Kidney Disease.    Anion gap 7 5 - 15  Magnesium     Status: Abnormal   Collection Time: 09/25/17  4:55 AM  Result Value Ref Range   Magnesium 1.5 (L) 1.7 - 2.4 mg/dL  Glucose, capillary     Status: Abnormal   Collection Time: 09/25/17  7:59 AM  Result Value Ref Range   Glucose-Capillary 64 (L) 65 - 99 mg/dL  Glucose, capillary     Status: None   Collection Time: 09/25/17  8:44 AM  Result Value Ref Range   Glucose-Capillary 70 65 - 99 mg/dL  Glucose, capillary     Status: Abnormal   Collection Time: 09/25/17 11:57 AM  Result Value Ref Range   Glucose-Capillary 59 (L) 65 - 99 mg/dL  Glucose, capillary     Status: None   Collection Time: 09/25/17 12:45 PM  Result Value Ref Range   Glucose-Capillary 91 65 - 99 mg/dL  Glucose, capillary     Status: None   Collection Time: 09/25/17  4:14 PM  Result Value Ref Range   Glucose-Capillary 82 65 - 99 mg/dL  Glucose, capillary     Status: None   Collection Time: 09/25/17  8:16 PM  Result Value Ref Range   Glucose-Capillary 85 65 - 99 mg/dL  Glucose, capillary     Status: None   Collection Time: 09/25/17 11:38 PM  Result Value Ref Range   Glucose-Capillary 81 65 - 99 mg/dL  Glucose, capillary     Status: Abnormal   Collection Time: 09/26/17  3:28 AM  Result Value Ref Range   Glucose-Capillary 62 (L) 65 - 99 mg/dL  Basic metabolic panel     Status: Abnormal   Collection Time: 09/26/17  4:10 AM  Result Value Ref Range   Sodium 138 135 - 145 mmol/L   Potassium 3.4 (L) 3.5 - 5.1 mmol/L    Comment: DELTA CHECK NOTED   Chloride 114 (H) 101 - 111 mmol/L   CO2 16 (L) 22 - 32 mmol/L   Glucose, Bld 107 (H) 65 - 99  mg/dL   BUN <5 (L) 6 - 20 mg/dL   Creatinine, Ser 1.13 (H) 0.44 - 1.00 mg/dL   Calcium 8.3 (L) 8.9 - 10.3 mg/dL   GFR calc non Af Amer 48 (L) >60 mL/min   GFR calc Af Amer 56 (L) >60 mL/min    Comment: (NOTE) The eGFR has been calculated using the CKD EPI equation. This calculation has not been validated in all clinical situations. eGFR's persistently <60 mL/min signify possible Chronic Kidney Disease.    Anion gap 8 5 - 15  Magnesium     Status: None   Collection Time: 09/26/17  4:16 AM  Result Value Ref Range   Magnesium  2.2 1.7 - 2.4 mg/dL  Glucose, capillary     Status: None   Collection Time: 09/26/17  5:15 AM  Result Value Ref Range   Glucose-Capillary 95 65 - 99 mg/dL  Glucose, capillary     Status: Abnormal   Collection Time: 09/26/17  8:03 AM  Result Value Ref Range   Glucose-Capillary 61 (L) 65 - 99 mg/dL  Glucose, capillary     Status: Abnormal   Collection Time: 09/26/17  8:25 AM  Result Value Ref Range   Glucose-Capillary 63 (L) 65 - 99 mg/dL  Glucose, capillary     Status: Abnormal   Collection Time: 09/26/17  8:50 AM  Result Value Ref Range   Glucose-Capillary 178 (H) 65 - 99 mg/dL    Ct Abdomen Pelvis W Contrast  Result Date: 09/24/2017 CLINICAL DATA:  Status post pelvic abscess drainage. EXAM: CT ABDOMEN AND PELVIS WITH CONTRAST TECHNIQUE: Multidetector CT imaging of the abdomen and pelvis was performed using the standard protocol following bolus administration of intravenous contrast. CONTRAST:  135m ISOVUE-300 IOPAMIDOL (ISOVUE-300) INJECTION 61% COMPARISON:  CT scan of September 16, 2017. FINDINGS: Lower chest: Moderate bilateral pleural effusions are noted with adjacent atelectasis. There is again noted large hiatal hernia. Crescent shaped fluid collection is again noted around the hernia. One surgically placed drainage catheter has been removed from this area. The remaining surgically placed drainage catheter is unchanged in position, but the fluid is distal  to the distal end of the catheter. Hepatobiliary: Status post cholecystectomy. 3.1 x 2.9 cm fluid collection remains inferior to falciform ligament. No focal abnormality is seen within the liver. Pancreas: Unremarkable. No pancreatic ductal dilatation or surrounding inflammatory changes. Spleen: Normal in size without focal abnormality. Adrenals/Urinary Tract: Adrenal glands are unremarkable. Kidneys are normal, without renal calculi, focal lesion, or hydronephrosis. Bladder is unremarkable. Stomach/Bowel: There is no evidence of bowel obstruction. Mild wall thickening of cecum and transverse colon is noted which may represent mild inflammation. Status post appendectomy. Vascular/Lymphatic: No significant vascular findings are present. No enlarged abdominal or pelvic lymph nodes. Reproductive: Uterus and bilateral adnexa are unremarkable. Other: There is been interval placement of right trans gluteal percutaneous drainage catheter. Fluid collection noted in pre rectal space on prior exam is significantly smaller. Only a small amount of residual fluid remains, measuring 6.3 x 1.8 cm. Musculoskeletal: No acute or significant osseous findings. IMPRESSION: Moderate bilateral pleural effusions are noted with adjacent atelectasis. Large hiatal hernia is again noted. Crescent-shaped fluid collection is again noted around the hernia which is not significantly changed. One of the surgically placed drains seen within this has been removed in the interval. The remaining drain does not appear to be positioned within the fluid collection. 3 cm fluid collection is seen inferior to falciform ligament which is slightly smaller compared to prior exam. Interval placement of right trans gluteal percutaneous drainage catheter into pre rectal abscess or fluid collection. This fluid collection is significantly smaller compared to prior exam, with only a small amount of residual fluid remaining. Electronically Signed   By: JMarijo Conception M.D.   On: 09/24/2017 14:50    Discharge Exam: Blood pressure 99/70, pulse 90, temperature 98.9 F (37.2 C), temperature source Oral, resp. rate 20, height 5' 4.5" (1.638 m), weight 64.2 kg (141 lb 8.6 oz), SpO2 100 %.  General: Pt awake/alert/oriented x4 in no acute distress Eyes: PERRL, normal EOM.  Sclera clear.  No icterus Neuro: CN II-XII intact w/o focal sensory/motor deficits. Lymph: No head/neck/groin  lymphadenopathy Psych:  No delerium/psychosis/paranoia HENT: Normocephalic, Mucus membranes moist.  No thrush Neck: Supple, No tracheal deviation Chest: No chest wall pain w good excursion CV:  Pulses intact.  Regular rhythm MS: Normal AROM mjr joints.  Signif kyphoscoliosis   Abdomen: Soft.  Nondistended.  Nontender.  Wound mostly clean w some granulation No evidence of peritonitis.  No incarcerated hernias.  Rectal - perc drain in place - serosanguinous Ext:  No deformity.  No mjr edema.  No cyanosis Skin: No petechiae / purpura   Past Medical History:  Diagnosis Date  . Crohn disease ([place])   . Hiatal hernia 12/07/2015  . Left buttock abscess 04/21/2017  . Psoriasis   . SBO (small bowel obstruction) ([place]) 07/27/2017    Past Surgical History:  Procedure Laterality Date  . ANAL STENOSIS  ~ 2010  . APPENDECTOMY  1980's  . BOWEL RESECTION  2009  . CHOLECYSTECTOMY  1990's  . COLONOSCOPY  2013  . COLONOSCOPY WITH PROPOFOL N/A 03/12/2017   Procedure: COLONOSCOPY WITH PROPOFOL;  Surgeon: Rogene Houston, MD;  Location: [facility];  Service: Endoscopy;  Laterality: N/A;  2:05-moved up to 1025 per Lelon Frohlich  . COLOSTOMY  2009   DEMASO  . COLOSTOMY TAKEDOWN  2009   "only had it for about 1 month" (08/22/2013)  . ESOPHAGEAL DILATION N/A 03/12/2017   Procedure: ESOPHAGEAL DILATION;  Surgeon: Rogene Houston, MD;  [facility]: [facility];  Service: Endoscopy;  Laterality: N/A;  . ESOPHAGOGASTRODUODENOSCOPY  11/11/2011   Procedure: ESOPHAGOGASTRODUODENOSCOPY (EGD);  Surgeon:  Missy Sabins, MD;  Location: [facility];  Service: Endoscopy;  Laterality: N/A;  . ESOPHAGOGASTRODUODENOSCOPY  05/18/2012   Procedure: ESOPHAGOGASTRODUODENOSCOPY (EGD);  Surgeon: Rogene Houston, MD;  [facility]: [facility];  Service: Endoscopy;  Laterality: N/A;  200  . ESOPHAGOGASTRODUODENOSCOPY (EGD) WITH PROPOFOL N/A 03/12/2017   Procedure: ESOPHAGOGASTRODUODENOSCOPY (EGD) WITH PROPOFOL;  Surgeon: Rogene Houston, MD;  [facility]: [facility];  Service: Endoscopy;  Laterality: N/A;  . FLEXIBLE SIGMOIDOSCOPY  11/11/2011   Procedure: FLEXIBLE SIGMOIDOSCOPY;  Surgeon: Missy Sabins, MD;  Location: [facility];  Service: Endoscopy;  Laterality: N/A;  . goiter  1999  . HIATAL HERNIA REPAIR N/A 09/08/2017   Procedure: DIAGNOSTIC LAPAROSCOPIC PLACEMENT OF gastrostomy TUBE AND GASTROPEXY;  Surgeon: Ralene Ok, MD;  Location: [facility];  Service: General;  Laterality: N/A;  . INCISION AND DRAINAGE PERIRECTAL ABSCESS N/A 04/21/2017   Procedure: IRRIGATION AND DEBRIDEMENT PERIRECTAL ABSCESS;  Surgeon: Coralie Keens, MD;  Location: [facility];  Service: General;  Laterality: N/A;  . LAPAROSCOPIC LYSIS OF ADHESIONS N/A 09/08/2017   Procedure: LAPAROSCOPIC LYSIS OF ADHESIONS;  Surgeon: Ralene Ok, MD;  Location: [facility];  Service: General;  Laterality: N/A;  . LAPAROTOMY N/A 09/10/2017   Procedure: Exploratory laparotomy with takedown of G tube, repair gastrostomy abdominal washout;  Surgeon: Clovis Riley, MD;  Location: [facility];  Service: General;  Laterality: N/A;  . LEFT HEART CATHETERIZATION WITH CORONARY ANGIOGRAM N/A 07/09/2014   Procedure: LEFT HEART CATHETERIZATION WITH CORONARY ANGIOGRAM;  Surgeon: Peter M Martinique, MD;  Location: [facility] CATH LAB;  Service: Cardiovascular;  Laterality: N/A;  . PROCTOSCOPY N/A 01/31/2015   Procedure: RIGID PROCTOSCOPY;  Surgeon: Leighton Ruff, MD;  Location: [facility];  Service: General;  Laterality: N/A;  . RECTAL EXAM UNDER ANESTHESIA N/A 01/31/2015   Procedure: RECTAL  EXAM UNDER ANESTHESIA WITH PLACEMENT OF A SETON;  Surgeon: Leighton Ruff, MD;  Location: [facility];  Service: General;  Laterality: N/A;  Social History   Social History  . Marital status: Single    Spouse name: N/A  . Number of children: N/A  . Years of education: N/A   Occupational History  . Not on file.   Social History Main Topics  . Smoking status: Former Smoker    Packs/day: 0.12    Years: 4.00    Types: Cigarettes    Quit date: 10/05/1996  . Smokeless tobacco: Never Used     Comment: 08/22/2013 1 pack every two weeks when she did smoked  . Alcohol use No  . Drug use: No  . Sexual activity: Not Currently    Birth control/ protection: Post-menopausal, Implant   Other Topics Concern  . Not on file   Social History Narrative   Lives in [place].  Moved from [place] in 2008.  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  LIves with her sister.   No home health services.      Family History  Problem Relation Age of Onset  . Diabetes Mother   . Heart disease Father   . Healthy Sister   . Hypertension Brother   . Colon cancer Brother        died with colon cancer  . Crohn's disease Paternal Aunt     Current Facility-Administered Medications  Medication Dose Route Frequency Provider Last Rate Last Dose  . 0.9 %  sodium chloride infusion  250 mL Intravenous PRN Michael Boston, MD      . acetaminophen (TYLENOL) suppository 650 mg  650 mg Rectal Q6H PRN Michael Boston, MD      . acetaminophen (TYLENOL) tablet 325-650 mg  325-650 mg Oral Q6H PRN Michael Boston, MD      . albuterol (PROVENTIL) (2.5 MG/3ML) 0.083% nebulizer solution 2.5 mg  2.5 mg Nebulization Q6H PRN Ralene Ok, MD   2.5 mg at 09/18/17 0451  . alum & mag hydroxide-simeth (MAALOX/MYLANTA) 200-200-20 MG/5ML suspension 15 mL  15 mL Oral Q4H PRN Donne Hazel, MD   15 mL at 09/20/17 0859  . anidulafungin (ERAXIS) 100 mg in sodium chloride 0.9 % 100 mL IVPB  100 mg Intravenous Q24H Javier Glazier, MD   Stopped at 09/25/17  1913  . bismuth subsalicylate (PEPTO BISMOL) 262 MG/15ML suspension 30 mL  30 mL Oral Q8H PRN Michael Boston, MD      . chlorhexidine gluconate (MEDLINE KIT) (PERIDEX) 0.12 % solution 15 mL  15 mL Mouth Rinse BID Javier Glazier, MD   15 mL at 09/25/17 2202  . Chlorhexidine Gluconate Cloth 2 % PADS 6 each  6 each Topical Q0600 Ralene Ok, MD   6 each at 09/25/17 0448  . dextrose 50 % solution 50 mL  1 ampule Intravenous Q6H PRN Michael Boston, MD   50 mL at 09/26/17 9381  . diazepam (VALIUM) tablet 5 mg  5 mg Oral Q6H PRN Ralene Ok, MD   5 mg at 09/23/17 1447  . diphenhydrAMINE (BENADRYL) capsule 25 mg  25 mg Oral Q6H PRN Michael Boston, MD      . diphenhydrAMINE (BENADRYL) injection 12.5-25 mg  12.5-25 mg Intravenous Q6H PRN Michael Boston, MD      . diphenoxylate-atropine (LOMOTIL) 2.5-0.025 MG per tablet 1 tablet  1 tablet Oral QID Ralene Ok, MD   1 tablet at 09/25/17 2202  . enoxaparin (LOVENOX) injection 40 mg  40 mg Subcutaneous Q24H Ralene Ok, MD   40 mg at 09/25/17 1019  . feeding supplement (BOOST / RESOURCE BREEZE)  liquid 1 Container  1 Container Oral TID BM Ralene Ok, MD   1 Container at 09/24/17 2038  . glucose chewable tablet 4 g  1 tablet Oral PRN Ralene Ok, MD   4 g at 09/26/17 0805  . guaiFENesin-dextromethorphan (ROBITUSSIN DM) 100-10 MG/5ML syrup 5 mL  5 mL Oral Q4H PRN Ralene Ok, MD   5 mL at 09/24/17 2038  . hydrALAZINE (APRESOLINE) injection 5-20 mg  5-20 mg Intravenous Q6H PRN Michael Boston, MD      . HYDROcodone-acetaminophen (NORCO) 7.5-325 MG per tablet 1 tablet  1 tablet Oral Q6H PRN Florencia Reasons, MD   1 tablet at 09/26/17 0806  . hydrocortisone (ANUSOL-HC) 2.5 % rectal cream 1 application  1 application Topical QID PRN Michael Boston, MD      . hydrocortisone cream 1 % 1 application  1 application Topical TID PRN Michael Boston, MD      . insulin aspart (novoLOG) injection 0-9 Units  0-9 Units Subcutaneous Q4H Javier Glazier, MD    1 Units at 09/23/17 1242  . lactated ringers bolus 1,000 mL  1,000 mL Intravenous Q8H PRN Michael Boston, MD      . lactobacillus acidophilus & bulgar (LACTINEX) chewable tablet 2 tablet  2 tablet Oral TID WC Polly Cobia, RPH   2 tablet at 09/26/17 0806  . lip balm (CARMEX) ointment 1 application  1 application Topical BID Michael Boston, MD   1 application at 39/03/00 2207  . magic mouthwash  15 mL Oral QID PRN Michael Boston, MD   15 mL at 09/25/17 1252  . MEDLINE mouth rinse  15 mL Mouth Rinse QID Javier Glazier, MD   15 mL at 09/25/17 0442  . megestrol (MEGACE) 400 MG/10ML suspension 400 mg  400 mg Oral Daily Ralene Ok, MD   400 mg at 09/25/17 1021  . menthol-cetylpyridinium (CEPACOL) lozenge 3 mg  1 lozenge Oral PRN Michael Boston, MD      . methocarbamol (ROBAXIN) 1,000 mg in dextrose 5 % 50 mL IVPB  1,000 mg Intravenous Q6H PRN Michael Boston, MD      . naloxone [facility]) injection 0.4 mg  0.4 mg Intravenous PRN Ralene Ok, MD      . ondansetron (ZOFRAN-ODT) disintegrating tablet 4 mg  4 mg Oral Q6H PRN Ralene Ok, MD       Or  . ondansetron [facility]) injection 4 mg  4 mg Intravenous Q6H PRN Ralene Ok, MD   4 mg at 09/20/17 9233  . pantoprazole (PROTONIX) EC tablet 40 mg  40 mg Oral Q1200 Michael Boston, MD   40 mg at 09/25/17 1153  . phenol (CHLORASEPTIC) mouth spray 1-2 spray  1-2 spray Mouth/Throat PRN Michael Boston, MD      . piperacillin-tazobactam (ZOSYN) IVPB 3.375 g  3.375 g Intravenous Q8H Pham, Anh P, RPH 12.5 mL/hr at 09/26/17 0334 3.375 g at 09/26/17 0334  . potassium chloride SA (K-DUR,KLOR-CON) CR tablet 40 mEq  40 mEq Oral BID Michael Boston, MD   40 mEq at 09/25/17 2202  . promethazine (PHENERGAN) injection 12.5 mg  12.5 mg Intravenous A0T PRN Leighton Ruff, MD   62.2 mg at 09/24/17 1219  . psyllium (HYDROCIL/METAMUCIL) packet 1 packet  1 packet Oral Daily Michael Boston, MD   1 packet at 09/25/17 1430  . sodium chloride flush (NS) 0.9 % injection  10 mL  10 mL Intravenous Q8H Aletta Edouard, MD   10 mL at 09/24/17 1839  . sodium chloride  flush (NS) 0.9 % injection 10-40 mL  10-40 mL Intracatheter Q12H Ralene Ok, MD   30 mL at 09/24/17 1001  . sodium chloride flush (NS) 0.9 % injection 10-40 mL  10-40 mL Intracatheter PRN Ralene Ok, MD   10 mL at 09/24/17 0512  . sodium chloride flush (NS) 0.9 % injection 3 mL  3 mL Intravenous Gorden Harms, MD      . sodium chloride flush (NS) 0.9 % injection 3 mL  3 mL Intravenous PRN Michael Boston, MD      . spironolactone (ALDACTONE) tablet 50 mg  50 mg Oral Daily Michael Boston, MD         Allergies  Allergen Reactions  . Zithromax [Azithromycin] Swelling and Other (See Comments)    Reaction:  Mouth swelling and blisters   . Aspirin Other (See Comments)    Reaction:  Stomach cramping   . Ibuprofen Other (See Comments)    Pt states that she prefers not to take this medication.   . Iron Nausea And Vomiting    FERROUS SULFATE  . Sulfa Antibiotics Nausea And Vomiting    Signed: Morton Peters, M.D., F.A.C.S. Gastrointestinal and [facility]imally Invasive [facility] [address] [place], [place] [address] [phone] Main / Paging   09/26/2017, 10:38 AM

## 2017-09-26 NOTE — Progress Notes (Signed)
PHARMACY CONSULT NOTE FOR:  OUTPATIENT  PARENTERAL ANTIBIOTIC THERAPY (OPAT)  Indication: C glabrata fungemia & intra-abdominal abscess Regimen: Invanz 1gm IV daily + Eraxis 100mg  IV daily End date: 10/10/17  IV antibiotic discharge orders are pended. To discharging provider:  please sign these orders via discharge navigator,  Select New Orders & click on the button choice - Manage This Unsigned Work.     Thank you for allowing pharmacy to be a part of this patient's care.  Biagio Borg 09/26/2017, 11:41 AM

## 2017-09-26 NOTE — Progress Notes (Signed)
Pharmacy Antibiotic Note  Danielle [name] is a 71 y.o. female admitted on 09/08/2017 with paraesophageal hiatal hernia s/p laparoscopic G-tube placement and gastropexy, LOA. Post-op ex lap reveals frank pus in abdomen. Patient's currently on broad abx for peritonitis and fungemia.  Today, 09/26/2017: - Day #16 zosyn,  Day #16 anidulafungin - afebrile - WBC better s/p IR drain placement 10/12 - renal function has stabilized - [facility] reported fluconazole suceptability to C glabrata blood cx =65mcg/ml  Plan: - Change Zosyn to Invanz 1g IV q24h - Continue Eraxis 100 mg IV q24h  - Plan d/c home today with 2 more weeks of antibiotics as discussed with surgery & ID __________________________  Height: 5' 4.5" (163.8 cm) Weight: 141 lb 8.6 oz (64.2 kg) IBW/kg (Calculated) : 55.85  Temp (24hrs), Avg:98.5 F (36.9 C), Min:98.2 F (36.8 C), Max:98.9 F (37.2 C)   Recent Labs Lab 09/21/17 0500  09/22/17 0514 09/23/17 0415 09/24/17 0511 09/25/17 0455 09/26/17 0410  WBC 18.1*  --  18.1* 16.2* 16.3* 15.1*  --   CREATININE 1.45*  < > 1.25* 1.15* 1.11* 1.18* 1.13*  < > = values in this interval not displayed.  Estimated Creatinine Clearance: 41.5 mL/min (A) (by C-G formula based on SCr of 1.13 mg/dL (H)).    Allergies  Allergen Reactions  . Zithromax [Azithromycin] Swelling and Other (See Comments)    Reaction:  Mouth swelling and blisters   . Aspirin Other (See Comments)    Reaction:  Stomach cramping   . Ibuprofen Other (See Comments)    Pt states that she prefers not to take this medication.   . Iron Nausea And Vomiting    FERROUS SULFATE  . Sulfa Antibiotics Nausea And Vomiting    Antimicrobials this admission: 10/3 Cefazolin x1 dose preop 10/3 Vanc x1 preop, then daily starting 10/4 >> 10/5 10/4 Zosyn >>  10/5 Zyvox >> 10/11 10/5 Eraxis >>  Dose adjustments this admission:   Microbiology results: 10/4 BCx: 2/2 bottles w/ C glabrata (MIC fluconazole=32) 10/5 MRSA  PCR: pos 10/5 UCx: NGF 10/10 BCx: NG-F 10/11 C. diff: neg/neg 10/12: pelvic abscess: C. glabrata  Thank you for allowing pharmacy to be a part of this patient's care.  Netta Cedars, PharmD, BCPS Pager: [phone] 09/26/2017 12:00 PM

## 2017-09-26 NOTE — Care Management Note (Signed)
Case Management Note  Patient Details  Name: Danielle [name] MRN: [id number] Date of Birth: 30-Sep-1946  Subjective/Objective:   Giant type IV paraesophageal Hiatal hernia, CKD, perianal Crohn's Disease with fistula                  Action/Plan: Discharge Planning: Chart reviewed. [place] RN arrange with [facility] for IV abx at home. Contacted [facility] to make aware of scheduled dc home today with IV abx. Unit RN will call surgeon to clarify care for drain or if IR will remove. Pt is hesitant about going home with drain.   PCP Iona Beard MD  Expected Discharge Date:  09/26/17               Expected Discharge Plan:  [place]  In-House Referral:  NA  Discharge planning Services  CM Consult  Post Acute Care Choice:  Home Health Choice offered to:  Patient  DME Arranged:  N/A DME Agency:  NA  HH Arranged:  RN, IV Antibiotics, PT HH Agency:  [place]  Status of Service:  Completed, signed off  If discussed at [place] of Stay Meetings, dates discussed:    Additional Comments:  Erenest Rasher, RN 09/26/2017, 11:50 AM

## 2017-09-26 NOTE — Discharge Instructions (Signed)
[employer], Adult A central line is a soft, flexible tube (catheter) that can be used to collect blood for testing or to give medicine or nutrition through a vein. The tip of the central line ends in a large vein just above the heart called the vena cava. A central line may be placed because:  You need to get medicines or fluids through an IV tube for a long period of time.  You need nutrition but cannot eat or absorb nutrients.  The veins in your hands or arms are hard to access.  You need to have blood taken often for blood tests.  You need a blood transfusion  You need chemotherapy or dialysis.  There are many types of central lines:  Peripherally inserted central catheter (PICC) line. This type is used for intermediate access to long-term access of one week or more. It can be used to draw blood and give fluids or medicines. A PICC looks like an IV tube, but it goes up the arm to the heart. It is usually inserted in the upper arm and taped in place on the arm.  Tunneled central line. This type is used for long-term therapy and dialysis. It is placed in a large vein in the neck, chest, or groin. A tunneled central line is inserted through a small incision made over the vein and is advanced into the heart. It is tunneled beneath the skin and brought out through a second incision.  Non-tunneled central line. This type is used for short-term access, usually of a maximum of 7 days. It is often used in the emergency department. A non-tunneled central line is inserted in the neck, chest, or groin.  Implanted port. This type is used for long-term therapy. It can stay in place longer than other types of central lines. An implanted port is normally inserted in the upper chest but can also be placed in the upper arm or in the abdomen. It is inserted and removed with surgery, and it is accessed using a special needle.  The type of central line that you receive depends on how long you will need it,  your medical condition, and the condition of your veins. What are the risks? Using any type of central line has risks that you should be aware of, including:  Infection.  A blood clot that blocks the central line or forms in the vein and travels to the heart.  Bleeding from the place where the central line was put in.  Developing a hole or crack within the central line. If this happens, the central line will need to be replaced.  Developing an abnormal heart rhythm (arrhythmia). This is rare.  Central line failure.  Follow these instructions at home: Flushing and cleaning the central line  Follow instructions from the health care provider about flushing and cleaning the central line.  Wear a mask when flushing or cleaning the central line.  Before you flush or clean the central line: ? Wash your hands with soap and water. ? Clean the central line hub with rubbing alcohol. Insertion site care  Keep the insertion site of your central line clean and dry at all times.  Check your incision or central line site every day for signs of infection. Check for: ? More redness, swelling, or pain. ? More fluid or blood. ? Warmth. ? Pus or a bad smell. General instructions  Follow instructions from your health care provider for the type of device that you have.  If  the central line accidentally gets pulled on, make sure: ? The bandage (dressing) is okay. ? There is no bleeding. ? The line has not been pulled out.  Return to your normal activities as told by your health care provider. Ask your health care provider what activities are safe for you. You may be restricted from lifting or making repetitive arm movements on the side with the catheter.  Do not swim or bathe unless your health care provider approves.  Keep your dressing dry. Your health care provider can instruct you about how to keep your specific type of dressing from getting wet.  Keep all follow-up visits as told by  your health care provider. This is important. Contact a health care provider if:  You have more redness, swelling, or pain around your incision.  You have more fluid or blood coming from your incision.  Your incision feels warm to the touch.  You have pus or a bad smell coming from your incision. Get help right away if:  You have: ? Chills. ? A fever. ? Shortness of breath. ? Trouble breathing. ? Chest pain. ? Swelling in your neck, face, chest, or arm on the side of your central line.  You are coughing.  You feel your heart beating rapidly or skipping beats.  You feel dizzy or you faint.  Your incision or central line site has red streaks spreading away from the area.  Your incision or central line site is bleeding and does not stop.  Your central line is difficult to flush or will not flush.  You do not get a blood return from the central line.  Your central line gets loose or comes out.  Your central line gets damaged.  Your catheter leaks when flushed or when fluids are infused into it. This information is not intended to replace advice given to you by your health care provider. Make sure you discuss any questions you have with your health care provider. Document Released: 01/14/2006 Document Revised: 07/22/2016 Document Reviewed: 07/01/2016 Elsevier Interactive Patient Education  2017 [place]  It is important that the wound be kept open.   -Keeping the skin edges apart will allow the wound to gradually heal from the base upwards.   - If the skin edges of the wound close too early, a new fluid pocket can form and infection can occur. -This is the reason to pack deeper wounds with gauze or ribbon -This is why drained wounds cannot be sewed closed right away  A healthy wound should form a lining of bright red "beefy" granulating tissue that will help shrink the wound and help the edges grow new skin into it.   -A little mucus / yellow  discharge is normal (the body's natural way to try and form a scab) and should be gently washed off with soap and water with daily dressing changes.  -Green or foul smelling drainage implies bacterial colonization and can slow wound healing - a short course of antibiotic ointment (3-5 days) can help it clear up.  Call the doctor if it does not improve or worsens  -Avoid use of antibiotic ointments for more than a week as they can slow wound healing over time.    -Sometimes other wound care products will be used to reduce need for dressing changes and/or help clean up dirty wounds -Sometimes the surgeon needs to debride the wound in the office to remove dead or infected tissue out of the wound so  it can heal more quickly and safely.    Change the dressing at least once a day -Wash the wound with mild soap and water gently every day.  It is good to shower or bathe the wound to help it clean out. -Use clean 4x4 gauze for medium/large wounds or ribbon plain NU-gauze for smaller wounds (it does not need to be sterile, just clean) -Keep the raw wound moist with a little saline or KY (saline) gel on the gauze.  -A dry wound will take longer to heal.  -Keep the skin dry around the wound to prevent breakdown and irritation. -Pack the wound down to the base -The goal is to keep the skin apart, not overpack the wound -Use a Q-tip or blunt-tipped kabob stick toothpick to push the gauze down to the base in narrow or deep wounds   -Cover with a clean gauze and tape -paper or Medipore tape tend to be gentle on the skin -rotate the orientation of the tape to avoid repeated stress/trauma on the skin -using an ACE or Coban wrap on wounds on arms or legs can be used instead.  Complete all antibiotics through the entire prescription to help the infection heal and prevent new places of infection   Returning the see the surgeon is helpful to follow the healing process and help the wound close as fast as  possible.   DRAIN CARE:   You have a closed bulb drain to help you heal.    A bulb drain is a small, plastic reservoir which creates a gentle suction. It is used to remove excess fluid from a surgical wound. The color and amount of fluid will vary. Immediately after surgery, the fluid is bright red. It may gradually change to a yellow color. When the amount decreases to about 1 or 2 tablespoons (15 to 30 cc) per 24 hours, your caregiver will usually remove it.  JP Care  The Jackson-Pratt drainage system has flexible tubing attached to a soft, plastic bulb with a stopper. The drainage end of the tubing, which is flat and white, goes into your body through a small opening near your incision (surgical cut). A stitch holds the drainage end in place. The rest of the tube is outside your body, attached to the bulb. When the bulb is compressed with the stopper in place, it creates a vacuum. This causes a constant gentle suction, which helps draw out fluid that collects under your incision. The bulb should be compressed at all times, except when you are emptying the drainage.  How long you will have your Jackson-Pratt depends on your surgery and the amount of fluid is draining. This is different for everyone. The Jackson-Pratt is usually removed when the drainage is 30 mL or less over 24 hours. To keep track of how much drainage youre having, you will record the amount in a drainage log. Its important to bring the log with you to your follow-up appointments.  Caring for Your Jackson-Pratt at Home In order to care for your Jackson-Pratt at home, you or your caregiver will do the following:  Empty the drain once a day and record the color and amount of drainage  Care for the area where the tubing enters your skin by washing with soap and water.  Milk the tubing to help move clots into the bulb.  Do this before you empty and measure your drainage. Look in the mirror at the tubing. This will help you  see where your hands  need to be. Pinch the tubing close to where it goes into your skin between your thumb and forefinger. With the thumb and forefinger of your other hand, pinch the tubing right below your other fingers. Keep your fingers pinched and slide them down the tubing, pushing any clots down toward the bulb. You may want to use alcohol swabs to help you slide your fingers down the tubing. Repeat steps 3 and 4 as necessary to push clots from the tubing into the bulb. If you are not able to move a clot into the bulb, call your doctors office. The fluid may leak around the insertion site if a clot is blocking the drainage flow. If there is fluid in the bulb and no leakage at the insertion site, the drain is working.  How to Empty Your Jackson-Pratt and Record the Drainage You will need to empty your Jackson-Pratt every day  Gather the following supplies:  Measuring container your nurse gave you Jackson-Pratt Drainage Record  Pen or pencil  Instructions Clean an area to work on. Clean your hands thoroughly. Unplug the stopper on top of your Jackson-Pratt. This will cause the bulb to expand. Do not touch the inside of the stopper or the inner area of the opening on the bulb. Turn your Jackson-Pratt upside down, gently squeeze the bulb, and pour the drainage into the measuring container. Turn your Jackson-Pratt right side up. Squeeze the bulb until your fingers feel the palm of your hand. Keep squeezing the bulb while you replug the stopper. Make sure the bulb stays fully compressed to ensure constant, gentle suction.    Check the amount and color of drainage in the measuring container. The first couple days after surgery the fluid may be dark red. This is normal. As you heal the fluid may look pink or pale yellow. Record this amount and the color of drainage on your Jackson-Pratt Drainage Record. Flush the drainage down the toilet and rinse the measuring container with  water.  Caring for the Insertion Site  Once you have emptied the drainage, clean your hands again. Check the area around the insertion site. Look for tenderness, swelling, or pus. If you have any of these, or if you have a temperature of 101 F (38.3 C) or higher, you may have an infection. Call your doctors office.  Sometimes, the drain causes redness the size of a dime at your insertion site. This is normal. Your healthcare provider will tell you if you should place a bandage over the insertion site.  Wash drain site with soap & water (dilute hydrogen peroxide PRN) daily & replace clean dressing / tape    DAILY CARE  Keep the bulb compressed at all times, except while emptying it. The compression creates suction.   Keep sites where the tubes enter the skin dry and covered with a light bandage (dressing).   Tape the tubes to your skin, 1 to 2 inches below the insertion sites, to keep from pulling on your stitches. Tubes are stitched in place and will not slip out.   Pin the bulb to your shirt (not to your pants) with a safety pin.   For the first few days after surgery, there usually is more fluid in the bulb. Empty the bulb whenever it becomes half full because the bulb does not create enough suction if it is too full. Include this amount in your 24 hour totals.   When the amount of drainage decreases, empty the bulb at the same  time every day. Write down the amounts and the 24 hour totals. Your caregiver will want to know them. This helps your caregiver know when the tubes can be removed.   (We anticipate removing the drain in 1-3 weeks, depending on when the output is <18mL a day for 2+ days)  If there is drainage around the tube sites, change dressings and keep the area dry. If you see a clot in the tube, leave it alone. However, if the tube does not appear to be draining, let your caregiver know.  TO EMPTY THE BULB  Open the stopper to release suction.   Holding the stopper  out of the way, pour drainage into the measuring cup that was sent home with you.   Measure and write down the amount. If there are 2 bulbs, note the amount of drainage from bulb 1 or bulb 2 and keep the totals separate. Your caregiver will want to know which tube is draining more.   Compress the bulb by folding it in half.   Replace the stopper.   Check the tape that holds the tube to your skin, and pin the bulb to your shirt.  SEEK MEDICAL CARE IF:  The drainage develops a bad odor.   You have an oral temperature above 102 F (38.9 C).   The amount of drainage from your wound suddenly increases or decreases.   You accidentally pull out your drain.   You have any other questions or concerns.  MAKE SURE YOU:   Understand these instructions.   Will watch your condition.   Will get help right away if you are not doing well or get worse.     Call [facility] if you have any questions about your drain. [phone]  SURGERY: POST OP INSTRUCTIONS    ######################################################################  EAT Gradually transition to a high fiber diet with a fiber supplement over the next few days after discharge  WALK Walk an hour a day.  Control your pain to do that.    CONTROL PAIN Control pain so that you can walk, sleep, tolerate sneezing/coughing, go up/down stairs.  HAVE A BOWEL MOVEMENT DAILY Keep your bowels regular to avoid problems.  OK to try a laxative to override constipation.  OK to use an antidairrheal to slow down diarrhea.  Call if not better after 2 tries  CALL IF YOU HAVE PROBLEMS/CONCERNS Call if you are still struggling despite following these instructions. Call if you have concerns not answered by these instructions  ######################################################################   DIET Follow a light diet the first few days at home.  Start with a bland diet such as soups, liquids, starchy  foods, low fat foods, etc.  If you feel full, bloated, or constipated, stay on a ful liquid or pureed/blenderized diet for a few days until you feel better and no longer constipated. Be sure to drink plenty of fluids every day to avoid getting dehydrated (feeling dizzy, not urinating, etc.). Gradually add a fiber supplement to your diet over the next week.  Gradually get back to a regular solid diet.  Avoid fast food or heavy meals the first week as you are more likely to get nauseated. It is expected for your digestive tract to need a few months to get back to normal.  It is common for your bowel movements and stools to be irregular.  You will have occasional bloating and cramping that should eventually fade away.  Until you are eating solid food normally, off  all pain medications, and back to regular activities; your bowels will not be normal. Focus on eating a low-fat, high fiber diet the rest of your life (See Getting to [place], below).  CARE of your INCISION or WOUND It is good for closed incision and even open wounds to be washed every day.  Shower every day.  Short baths are fine.  Wash the incisions and wounds clean with soap & water.    If you have a closed incision(s), wash the incision with soap & water every day.  You may leave closed incisions open to air if it is dry.   You may cover the incision with clean gauze & replace it after your daily shower for comfort. If you have skin tapes (Steristrips) or skin glue (Dermabond) on your incision, leave them in place.  They will fall off on their own like a scab.  You may trim any edges that curl up with clean scissors.  If you have staples, set up an appointment for them to be removed in the office in 10 days after surgery.  If you have a drain, wash around the skin exit site with soap & water and place a new dressing of gauze or band aid around the skin every day.  Keep the drain site clean & dry.    If you have an open wound with  packing, see wound care instructions.  In general, it is encouraged that you remove your dressing and packing, shower with soap & water, and replace your dressing once a day.  Pack the wound with clean gauze moistened with normal (0.9%) saline to keep the wound moist & uninfected.  Pressure on the dressing for 30 minutes will stop most wound bleeding.  Eventually your body will heal & pull the open wound closed over the next few months.  Raw open wounds will occasionally bleed or secrete yellow drainage until it heals closed.  Drain sites will drain a little until the drain is removed.  Even closed incisions can have mild bleeding or drainage the first few days until the skin edges scab over & seal.   If you have an open wound with a wound vac, see wound vac care instructions.     ACTIVITIES as tolerated Start light daily activities --- self-care, walking, climbing stairs-- beginning the day after surgery.  Gradually increase activities as tolerated.  Control your pain to be active.  Stop when you are tired.  Ideally, walk several times a day, eventually an hour a day.   Most people are back to most day-to-day activities in a few weeks.  It takes 4-8 weeks to get back to unrestricted, intense activity. If you can walk 30 minutes without difficulty, it is safe to try more intense activity such as jogging, treadmill, bicycling, low-impact aerobics, swimming, etc. Save the most intensive and strenuous activity for last (Usually 4-8 weeks after surgery) such as sit-ups, heavy lifting, contact sports, etc.  Refrain from any intense heavy lifting or straining until you are off narcotics for pain control.  You will have off days, but things should improve week-by-week. DO NOT PUSH THROUGH PAIN.  Let pain be your guide: If it hurts to do something, don't do it.  Pain is your body warning you to avoid that activity for another week until the pain goes down. You may drive when you are no longer taking narcotic  prescription pain medication, you can comfortably wear a seatbelt, and you can safely make  sudden turns/stops to protect yourself without hesitating due to pain. You may have sexual intercourse when it is comfortable. If it hurts to do something, stop.  MEDICATIONS Take your usually prescribed home medications unless otherwise directed.   Blood thinners:  Usually you can restart any strong blood thinners after the second postoperative day.  It is OK to take aspirin right away.     If you are on strong blood thinners (warfarin/Coumadin, Plavix, Xerelto, Eliquis, Pradaxa, etc), discuss with your surgeon, medicine PCP, and/or cardiologist for instructions on when to restart the blood thinner & if blood monitoring is needed (PT/INR blood check, etc).     PAIN CONTROL Pain after surgery or related to activity is often due to strain/injury to muscle, tendon, nerves and/or incisions.  This pain is usually short-term and will improve in a few months.  To help speed the process of healing and to get back to regular activity more quickly, DO THE FOLLOWING THINGS TOGETHER: 1. Increase activity gradually.  DO NOT PUSH THROUGH PAIN 2. Use Ice and/or Heat 3. Try Gentle Massage and/or Stretching 4. Take over the counter pain medication 5. Take Narcotic prescription pain medication for more severe pain  Good pain control = faster recovery.  It is better to take more medicine to be more active than to stay in bed all day to avoid medications. 1.  Increase activity gradually Avoid heavy lifting at first, then increase to lifting as tolerated over the next 6 weeks. Do not push through the pain.  Listen to your body and avoid positions and maneuvers than reproduce the pain.  Wait a few days before trying something more intense Walking an hour a day is encouraged to help your body recover faster and more safely.  Start slowly and stop when getting sore.  If you can walk 30 minutes without stopping or pain, you  can try more intense activity (running, jogging, aerobics, cycling, swimming, treadmill, sex, sports, weightlifting, etc.) Remember: If it hurts to do it, then dont do it! 2. Use Ice and/or Heat You will have swelling and bruising around the incisions.  This will take several weeks to resolve. Ice packs or heating pads (6-8 times a day, 30-60 minutes at a time) will help sooth soreness & bruising. Some people prefer to use ice alone, heat alone, or alternate between ice & heat.  Experiment and see what works best for you.  Consider trying ice for the first few days to help decrease swelling and bruising; then, switch to heat to help relax sore spots and speed recovery. Shower every day.  Short baths are fine.  It feels good!  Keep the incisions and wounds clean with soap & water.   3. Try Gentle Massage and/or Stretching Massage at the area of pain many times a day Stop if you feel pain - do not overdo it 4. Take over the counter pain medication This helps the muscle and nerve tissues become less irritable and calm down faster Choose ONE of the following over-the-counter anti-inflammatory medications: Acetaminophen 500mg  tabs (Tylenol) 1-2 pills with every meal and just before bedtime (avoid if you have liver problems or if you have acetaminophen in you narcotic prescription) Naproxen 220mg  tabs (ex. Aleve, Naprosyn) 1-2 pills twice a day (avoid if you have kidney, stomach, IBD, or bleeding problems) Ibuprofen 200mg  tabs (ex. Advil, Motrin) 3-4 pills with every meal and just before bedtime (avoid if you have kidney, stomach, IBD, or bleeding problems) Take with food/snack several times  a day as directed for at least 2 weeks to help keep pain / soreness down & more manageable. 5. Take Narcotic prescription pain medication for more severe pain A prescription for strong pain control is often given to you upon discharge (for example: oxycodone/Percocet, hydrocodone/Norco/Vicodin, or  tramadol/Ultram) Take your pain medication as prescribed. Be mindful that most narcotic prescriptions contain Tylenol (acetaminophen) as well - avoid taking too much Tylenol. If you are having problems/concerns with the prescription medicine (does not control pain, nausea, vomiting, rash, itching, etc.), please call us [phone] to see if we need to switch you to a different pain medicine that will work better for you and/or control your side effects better. If you need a refill on your pain medication, you must call the office before 4 pm and on weekdays only.  By federal law, prescriptions for narcotics cannot be called into a pharmacy.  They must be filled out on paper & picked up from our office by the patient or authorized caretaker.  Prescriptions cannot be filled after 4 pm nor on weekends.    WHEN TO CALL us [phone] Severe uncontrolled or worsening pain  Fever over 101 F (38.5 C) Concerns with the incision: Worsening pain, redness, rash/hives, swelling, bleeding, or drainage Reactions / problems with new medications (itching, rash, hives, nausea, etc.) Nausea and/or vomiting Difficulty urinating Difficulty breathing Worsening fatigue, dizziness, lightheadedness, blurred vision Other concerns If you are not getting better after two weeks or are noticing you are getting worse, contact our office [phone] for further advice.  We may need to adjust your medications, re-evaluate you in the office, send you to the emergency room, or see what other things we can do to help. The clinic staff is available to answer your questions during regular business hours (8:30am-5pm).  Please dont hesitate to call and ask to speak to one of our nurses for clinical concerns.    A surgeon from [facility] is always on call at the hospitals 24 hours/day If you have a medical emergency, go to the nearest emergency room or call 911.  FOLLOW UP in our office One the day of your  discharge from the hospital (or the next business weekday), please call [facility] to set up or confirm an appointment to see your surgeon in the office for a follow-up appointment.  Usually it is 2-3 weeks after your surgery.   If you have skin staples at your incision(s), let the office know so we can set up a time in the office for the nurse to remove them (usually around 10 days after surgery). Make sure that you call for appointments the day of discharge (or the next business weekday) from the hospital to ensure a convenient appointment time. IF YOU HAVE DISABILITY OR FAMILY LEAVE FORMS, BRING THEM TO THE OFFICE FOR PROCESSING.  DO NOT GIVE THEM TO YOUR DOCTOR.  [facility] [address] [place], [place], [place]  [address] ? [phone] - Main [phone] - [place],  [phone] - Fax [email or web address]  GETTING TO GOOD BOWEL HEALTH. It is expected for your digestive tract to need a few months to get back to normal.  It is common for your bowel movements and stools to be irregular.  You will have occasional bloating and cramping that should eventually fade away.  Until you are eating solid food normally, off all pain medications, and back to regular activities; your bowels will not be  normal.   Avoiding constipation The goal: ONE SOFT BOWEL MOVEMENT A DAY!    Drink plenty of fluids.  Choose water first. TAKE A FIBER SUPPLEMENT EVERY DAY THE REST OF YOUR LIFE During your first week back home, gradually add back a fiber supplement every day Experiment which form you can tolerate.   There are many forms such as powders, tablets, wafers, gummies, etc Psyllium bran (Metamucil), methylcellulose (Citrucel), Miralax or Glycolax, Benefiber, Flax Seed.  Adjust the dose week-by-week (1/2 dose/day to 6 doses a day) until you are moving your bowels 1-2 times a day.  Cut back the dose or try a different fiber product if it is giving you  problems such as diarrhea or bloating. Sometimes a laxative is needed to help jump-start bowels if constipated until the fiber supplement can help regulate your bowels.  If you are tolerating eating & you are farting, it is okay to try a gentle laxative such as double dose MiraLax, prune juice, or Milk of Magnesia.  Avoid using laxatives too often. Stool softeners can sometimes help counteract the constipating effects of narcotic pain medicines.  It can also cause diarrhea, so avoid using for too long. If you are still constipated despite taking fiber daily, eating solids, and a few doses of laxatives, call our office. Controlling diarrhea Try drinking liquids and eating bland foods for a few days to avoid stressing your intestines further. Avoid dairy products (especially milk & ice cream) for a short time.  The intestines often can lose the ability to digest lactose when stressed. Avoid foods that cause gassiness or bloating.  Typical foods include beans and other legumes, cabbage, broccoli, and dairy foods.  Avoid greasy, spicy, fast foods.  Every person has some sensitivity to other foods, so listen to your body and avoid those foods that trigger problems for you. Probiotics (such as active yogurt, Align, etc) may help repopulate the intestines and colon with normal bacteria and calm down a sensitive digestive tract Adding a fiber supplement gradually can help thicken stools by absorbing excess fluid and retrain the intestines to act more normally.  Slowly increase the dose over a few weeks.  Too much fiber too soon can backfire and cause cramping & bloating. It is okay to try and slow down diarrhea with a few doses of antidiarrheal medicines.   Bismuth subsalicylate (ex. Kayopectate, Pepto Bismol) for a few doses can help control diarrhea.  Avoid if pregnant.   Loperamide (Imodium) can slow down diarrhea.  Start with one tablet (2mg ) first.  Avoid if you are having fevers or severe pain.  ILEOSTOMY  PATIENTS WILL HAVE CHRONIC DIARRHEA since their colon is not in use.    Drink plenty of liquids.  You will need to drink even more glasses of water/liquid a day to avoid getting dehydrated. Record output from your ileostomy.  Expect to empty the bag every 3-4 hours at first.  Most people with a permanent ileostomy empty their bag 4-6 times at the least.   Use antidiarrheal medicine (especially Imodium) several times a day to avoid getting dehydrated.  Start with a dose at bedtime & breakfast.  Adjust up or down as needed.  Increase antidiarrheal medications as directed to avoid emptying the bag more than 8 times a day (every 3 hours). Work with your wound ostomy nurse to learn care for your ostomy.  See ostomy care instructions. TROUBLESHOOTING IRREGULAR BOWELS 1) Start with a soft & bland diet. No spicy, greasy, or fried foods.  2) Avoid gluten/wheat or dairy products from diet to see if symptoms improve. 3) Miralax 17gm or flax seed mixed in [place]. water or juice-daily. May use 2-4 times a day as needed. 4) Gas-X, Phazyme, etc. as needed for gas & bloating.  5) Prilosec (omeprazole) over-the-counter as needed 6)  Consider probiotics (Align, Activa, etc) to help calm the bowels down  Call your doctor if you are getting worse or not getting better.  Sometimes further testing (cultures, endoscopy, X-ray studies, CT scans, bloodwork, etc.) may be needed to help diagnose and treat the cause of the diarrhea. [facility], [place], [place], [place], [place]  [address] [phone] - Main.    [phone]  - Toll Free.   [phone] - Fax [email or web address]

## 2017-09-27 ENCOUNTER — Other Ambulatory Visit (INDEPENDENT_AMBULATORY_CARE_PROVIDER_SITE_OTHER): Payer: Self-pay | Admitting: Internal Medicine

## 2017-09-27 DIAGNOSIS — E43 Unspecified severe protein-calorie malnutrition: Secondary | ICD-10-CM | POA: Diagnosis not present

## 2017-09-27 DIAGNOSIS — B377 Candidal sepsis: Secondary | ICD-10-CM | POA: Diagnosis not present

## 2017-09-27 DIAGNOSIS — D631 Anemia in chronic kidney disease: Secondary | ICD-10-CM | POA: Diagnosis not present

## 2017-09-27 DIAGNOSIS — K50919 Crohn's disease, unspecified, with unspecified complications: Secondary | ICD-10-CM | POA: Diagnosis not present

## 2017-09-27 DIAGNOSIS — Z792 Long term (current) use of antibiotics: Secondary | ICD-10-CM | POA: Diagnosis not present

## 2017-09-27 DIAGNOSIS — K219 Gastro-esophageal reflux disease without esophagitis: Secondary | ICD-10-CM | POA: Diagnosis not present

## 2017-09-27 DIAGNOSIS — K580 Irritable bowel syndrome with diarrhea: Secondary | ICD-10-CM | POA: Diagnosis not present

## 2017-09-27 DIAGNOSIS — K651 Peritoneal abscess: Secondary | ICD-10-CM | POA: Diagnosis not present

## 2017-09-27 DIAGNOSIS — K5000 Crohn's disease of small intestine without complications: Secondary | ICD-10-CM | POA: Diagnosis not present

## 2017-09-27 DIAGNOSIS — K5090 Crohn's disease, unspecified, without complications: Secondary | ICD-10-CM | POA: Diagnosis not present

## 2017-09-27 DIAGNOSIS — N183 Chronic kidney disease, stage 3 (moderate): Secondary | ICD-10-CM | POA: Diagnosis not present

## 2017-09-27 DIAGNOSIS — T8143XA Infection following a procedure, organ and space surgical site, initial encounter: Secondary | ICD-10-CM | POA: Diagnosis not present

## 2017-09-27 DIAGNOSIS — L409 Psoriasis, unspecified: Secondary | ICD-10-CM | POA: Diagnosis not present

## 2017-09-27 DIAGNOSIS — R32 Unspecified urinary incontinence: Secondary | ICD-10-CM | POA: Diagnosis not present

## 2017-09-27 DIAGNOSIS — Z452 Encounter for adjustment and management of vascular access device: Secondary | ICD-10-CM | POA: Diagnosis not present

## 2017-09-28 DIAGNOSIS — N183 Chronic kidney disease, stage 3 (moderate): Secondary | ICD-10-CM | POA: Diagnosis not present

## 2017-09-28 DIAGNOSIS — D631 Anemia in chronic kidney disease: Secondary | ICD-10-CM | POA: Diagnosis not present

## 2017-09-28 DIAGNOSIS — T8143XA Infection following a procedure, organ and space surgical site, initial encounter: Secondary | ICD-10-CM | POA: Diagnosis not present

## 2017-09-28 DIAGNOSIS — K219 Gastro-esophageal reflux disease without esophagitis: Secondary | ICD-10-CM | POA: Diagnosis not present

## 2017-09-28 DIAGNOSIS — Z452 Encounter for adjustment and management of vascular access device: Secondary | ICD-10-CM | POA: Diagnosis not present

## 2017-09-28 DIAGNOSIS — E43 Unspecified severe protein-calorie malnutrition: Secondary | ICD-10-CM | POA: Diagnosis not present

## 2017-09-28 DIAGNOSIS — L409 Psoriasis, unspecified: Secondary | ICD-10-CM | POA: Diagnosis not present

## 2017-09-28 DIAGNOSIS — Z792 Long term (current) use of antibiotics: Secondary | ICD-10-CM | POA: Diagnosis not present

## 2017-09-28 DIAGNOSIS — K5000 Crohn's disease of small intestine without complications: Secondary | ICD-10-CM | POA: Diagnosis not present

## 2017-09-29 DIAGNOSIS — K5090 Crohn's disease, unspecified, without complications: Secondary | ICD-10-CM | POA: Diagnosis not present

## 2017-09-29 DIAGNOSIS — K50919 Crohn's disease, unspecified, with unspecified complications: Secondary | ICD-10-CM | POA: Diagnosis not present

## 2017-09-29 DIAGNOSIS — R32 Unspecified urinary incontinence: Secondary | ICD-10-CM | POA: Diagnosis not present

## 2017-09-29 DIAGNOSIS — K651 Peritoneal abscess: Secondary | ICD-10-CM | POA: Diagnosis not present

## 2017-09-29 DIAGNOSIS — K580 Irritable bowel syndrome with diarrhea: Secondary | ICD-10-CM | POA: Diagnosis not present

## 2017-09-29 DIAGNOSIS — B377 Candidal sepsis: Secondary | ICD-10-CM | POA: Diagnosis not present

## 2017-10-01 ENCOUNTER — Encounter (HOSPITAL_COMMUNITY): Payer: Self-pay

## 2017-10-01 ENCOUNTER — Inpatient Hospital Stay (HOSPITAL_COMMUNITY)
Admission: EM | Admit: 2017-10-01 | Discharge: 2017-10-07 | DRG: 641 | Disposition: A | Discharge status: Home / Self Care | Payer: Medicare HMO | Attending: Internal Medicine | Admitting: Internal Medicine

## 2017-10-01 ENCOUNTER — Emergency Department (HOSPITAL_COMMUNITY): Payer: Medicare HMO

## 2017-10-01 DIAGNOSIS — I82A19 Acute embolism and thrombosis of unspecified axillary vein: Secondary | ICD-10-CM

## 2017-10-01 DIAGNOSIS — R0682 Tachypnea, not elsewhere classified: Secondary | ICD-10-CM | POA: Diagnosis present

## 2017-10-01 DIAGNOSIS — N179 Acute kidney failure, unspecified: Secondary | ICD-10-CM | POA: Diagnosis present

## 2017-10-01 DIAGNOSIS — N183 Chronic kidney disease, stage 3 (moderate): Secondary | ICD-10-CM | POA: Diagnosis present

## 2017-10-01 DIAGNOSIS — L02211 Cutaneous abscess of abdominal wall: Secondary | ICD-10-CM | POA: Diagnosis not present

## 2017-10-01 DIAGNOSIS — I82622 Acute embolism and thrombosis of deep veins of left upper extremity: Secondary | ICD-10-CM | POA: Diagnosis not present

## 2017-10-01 DIAGNOSIS — Z881 Allergy status to other antibiotic agents status: Secondary | ICD-10-CM

## 2017-10-01 DIAGNOSIS — Z87891 Personal history of nicotine dependence: Secondary | ICD-10-CM

## 2017-10-01 DIAGNOSIS — I4510 Unspecified right bundle-branch block: Secondary | ICD-10-CM | POA: Diagnosis present

## 2017-10-01 DIAGNOSIS — I82409 Acute embolism and thrombosis of unspecified deep veins of unspecified lower extremity: Secondary | ICD-10-CM

## 2017-10-01 DIAGNOSIS — Z9889 Other specified postprocedural states: Secondary | ICD-10-CM

## 2017-10-01 DIAGNOSIS — X58XXXA Exposure to other specified factors, initial encounter: Secondary | ICD-10-CM | POA: Diagnosis present

## 2017-10-01 DIAGNOSIS — T8143XA Infection following a procedure, organ and space surgical site, initial encounter: Secondary | ICD-10-CM | POA: Diagnosis not present

## 2017-10-01 DIAGNOSIS — T82818A Embolism of vascular prosthetic devices, implants and grafts, initial encounter: Secondary | ICD-10-CM | POA: Diagnosis present

## 2017-10-01 DIAGNOSIS — K5000 Crohn's disease of small intestine without complications: Secondary | ICD-10-CM | POA: Diagnosis not present

## 2017-10-01 DIAGNOSIS — Z452 Encounter for adjustment and management of vascular access device: Secondary | ICD-10-CM | POA: Diagnosis not present

## 2017-10-01 DIAGNOSIS — B9689 Other specified bacterial agents as the cause of diseases classified elsewhere: Secondary | ICD-10-CM | POA: Diagnosis not present

## 2017-10-01 DIAGNOSIS — J90 Pleural effusion, not elsewhere classified: Secondary | ICD-10-CM | POA: Diagnosis present

## 2017-10-01 DIAGNOSIS — E870 Hyperosmolality and hypernatremia: Secondary | ICD-10-CM | POA: Diagnosis present

## 2017-10-01 DIAGNOSIS — Z6825 Body mass index (BMI) 25.0-25.9, adult: Secondary | ICD-10-CM | POA: Diagnosis not present

## 2017-10-01 DIAGNOSIS — Z8249 Family history of ischemic heart disease and other diseases of the circulatory system: Secondary | ICD-10-CM

## 2017-10-01 DIAGNOSIS — K449 Diaphragmatic hernia without obstruction or gangrene: Secondary | ICD-10-CM | POA: Diagnosis not present

## 2017-10-01 DIAGNOSIS — Z882 Allergy status to sulfonamides status: Secondary | ICD-10-CM | POA: Diagnosis not present

## 2017-10-01 DIAGNOSIS — K5090 Crohn's disease, unspecified, without complications: Secondary | ICD-10-CM | POA: Diagnosis present

## 2017-10-01 DIAGNOSIS — I82629 Acute embolism and thrombosis of deep veins of unspecified upper extremity: Secondary | ICD-10-CM | POA: Diagnosis not present

## 2017-10-01 DIAGNOSIS — D649 Anemia, unspecified: Secondary | ICD-10-CM

## 2017-10-01 DIAGNOSIS — D631 Anemia in chronic kidney disease: Secondary | ICD-10-CM | POA: Diagnosis not present

## 2017-10-01 DIAGNOSIS — R6510 Systemic inflammatory response syndrome (SIRS) of non-infectious origin without acute organ dysfunction: Secondary | ICD-10-CM | POA: Diagnosis present

## 2017-10-01 DIAGNOSIS — I82A11 Acute embolism and thrombosis of right axillary vein: Secondary | ICD-10-CM | POA: Diagnosis not present

## 2017-10-01 DIAGNOSIS — B49 Unspecified mycosis: Secondary | ICD-10-CM | POA: Diagnosis not present

## 2017-10-01 DIAGNOSIS — K659 Peritonitis, unspecified: Secondary | ICD-10-CM | POA: Diagnosis not present

## 2017-10-01 DIAGNOSIS — Z79899 Other long term (current) drug therapy: Secondary | ICD-10-CM

## 2017-10-01 DIAGNOSIS — K50919 Crohn's disease, unspecified, with unspecified complications: Secondary | ICD-10-CM | POA: Diagnosis not present

## 2017-10-01 DIAGNOSIS — R509 Fever, unspecified: Secondary | ICD-10-CM | POA: Diagnosis not present

## 2017-10-01 DIAGNOSIS — R5081 Fever presenting with conditions classified elsewhere: Secondary | ICD-10-CM | POA: Diagnosis not present

## 2017-10-01 DIAGNOSIS — E46 Unspecified protein-calorie malnutrition: Secondary | ICD-10-CM

## 2017-10-01 DIAGNOSIS — Z886 Allergy status to analgesic agent status: Secondary | ICD-10-CM | POA: Diagnosis not present

## 2017-10-01 DIAGNOSIS — E872 Acidosis, unspecified: Secondary | ICD-10-CM

## 2017-10-01 DIAGNOSIS — E860 Dehydration: Secondary | ICD-10-CM | POA: Diagnosis present

## 2017-10-01 DIAGNOSIS — D696 Thrombocytopenia, unspecified: Secondary | ICD-10-CM | POA: Diagnosis not present

## 2017-10-01 DIAGNOSIS — K651 Peritoneal abscess: Secondary | ICD-10-CM | POA: Diagnosis not present

## 2017-10-01 DIAGNOSIS — M79609 Pain in unspecified limb: Secondary | ICD-10-CM | POA: Diagnosis not present

## 2017-10-01 DIAGNOSIS — Z9049 Acquired absence of other specified parts of digestive tract: Secondary | ICD-10-CM | POA: Diagnosis not present

## 2017-10-01 DIAGNOSIS — Z87898 Personal history of other specified conditions: Secondary | ICD-10-CM

## 2017-10-01 DIAGNOSIS — L409 Psoriasis, unspecified: Secondary | ICD-10-CM | POA: Diagnosis not present

## 2017-10-01 DIAGNOSIS — Z79891 Long term (current) use of opiate analgesic: Secondary | ICD-10-CM

## 2017-10-01 DIAGNOSIS — D473 Essential (hemorrhagic) thrombocythemia: Secondary | ICD-10-CM

## 2017-10-01 DIAGNOSIS — Z800 Family history of malignant neoplasm of digestive organs: Secondary | ICD-10-CM

## 2017-10-01 DIAGNOSIS — I82621 Acute embolism and thrombosis of deep veins of right upper extremity: Secondary | ICD-10-CM | POA: Diagnosis present

## 2017-10-01 DIAGNOSIS — D72829 Elevated white blood cell count, unspecified: Secondary | ICD-10-CM | POA: Diagnosis not present

## 2017-10-01 DIAGNOSIS — E876 Hypokalemia: Secondary | ICD-10-CM | POA: Diagnosis present

## 2017-10-01 DIAGNOSIS — E440 Moderate protein-calorie malnutrition: Secondary | ICD-10-CM

## 2017-10-01 DIAGNOSIS — K567 Ileus, unspecified: Secondary | ICD-10-CM | POA: Diagnosis present

## 2017-10-01 DIAGNOSIS — Z833 Family history of diabetes mellitus: Secondary | ICD-10-CM

## 2017-10-01 DIAGNOSIS — Z888 Allergy status to other drugs, medicaments and biological substances status: Secondary | ICD-10-CM

## 2017-10-01 DIAGNOSIS — Z792 Long term (current) use of antibiotics: Secondary | ICD-10-CM | POA: Diagnosis not present

## 2017-10-01 DIAGNOSIS — A419 Sepsis, unspecified organism: Secondary | ICD-10-CM

## 2017-10-01 DIAGNOSIS — K219 Gastro-esophageal reflux disease without esophagitis: Secondary | ICD-10-CM | POA: Diagnosis not present

## 2017-10-01 DIAGNOSIS — D75839 Thrombocytosis, unspecified: Secondary | ICD-10-CM

## 2017-10-01 DIAGNOSIS — E43 Unspecified severe protein-calorie malnutrition: Secondary | ICD-10-CM | POA: Diagnosis not present

## 2017-10-01 DIAGNOSIS — M7989 Other specified soft tissue disorders: Secondary | ICD-10-CM | POA: Diagnosis not present

## 2017-10-01 LAB — COMPREHENSIVE METABOLIC PANEL
ALT: 9 U/L — AB (ref 14–54)
AST: 17 U/L (ref 15–41)
Albumin: 2.6 g/dL — ABNORMAL LOW (ref 3.5–5.0)
Alkaline Phosphatase: 120 U/L (ref 38–126)
Anion gap: 13 (ref 5–15)
BUN: 9 mg/dL (ref 6–20)
CHLORIDE: 115 mmol/L — AB (ref 101–111)
CO2: 13 mmol/L — AB (ref 22–32)
CREATININE: 1.15 mg/dL — AB (ref 0.44–1.00)
Calcium: 8.6 mg/dL — ABNORMAL LOW (ref 8.9–10.3)
GFR calc Af Amer: 55 mL/min — ABNORMAL LOW (ref >60)
GFR calc non Af Amer: 47 mL/min — ABNORMAL LOW (ref >60)
Glucose, Bld: 69 mg/dL (ref 65–99)
POTASSIUM: 3.6 mmol/L (ref 3.5–5.1)
SODIUM: 141 mmol/L (ref 135–145)
Total Bilirubin: 1.1 mg/dL (ref 0.3–1.2)
Total Protein: 8.4 g/dL — ABNORMAL HIGH (ref 6.5–8.1)

## 2017-10-01 LAB — I-STAT CG4 LACTIC ACID, ED
LACTIC ACID, VENOUS: 0.48 mmol/L — AB (ref 0.5–1.9)
Lactic Acid, Venous: 0.64 mmol/L (ref 0.5–1.9)

## 2017-10-01 LAB — MISC LABCORP TEST (SEND OUT): Labcorp test code: 183483

## 2017-10-01 LAB — CBC WITH DIFFERENTIAL/PLATELET
BASOS PCT: 0 %
Basophils Absolute: 0 10*3/uL (ref 0.0–0.1)
EOS PCT: 1 %
Eosinophils Absolute: 0.3 10*3/uL (ref 0.0–0.7)
HEMATOCRIT: 27.7 % — AB (ref 36.0–46.0)
HEMOGLOBIN: 9.1 g/dL — AB (ref 12.0–15.0)
LYMPHS ABS: 6.4 10*3/uL — AB (ref 0.7–4.0)
Lymphocytes Relative: 23 %
MCH: 28.3 pg (ref 26.0–34.0)
MCHC: 32.9 g/dL (ref 30.0–36.0)
MCV: 86 fL (ref 78.0–100.0)
MONO ABS: 2.2 10*3/uL — AB (ref 0.1–1.0)
MONOS PCT: 8 %
NEUTROS PCT: 68 %
Neutro Abs: 19.1 10*3/uL — ABNORMAL HIGH (ref 1.7–7.7)
Platelets: 461 10*3/uL — ABNORMAL HIGH (ref 150–400)
RBC: 3.22 MIL/uL — ABNORMAL LOW (ref 3.87–5.11)
RDW: 15.7 % — ABNORMAL HIGH (ref 11.5–15.5)
WBC: 28 10*3/uL — ABNORMAL HIGH (ref 4.0–10.5)

## 2017-10-01 LAB — PATHOLOGIST SMEAR REVIEW

## 2017-10-01 MED ORDER — SODIUM BICARBONATE 650 MG PO TABS
650.0000 mg | ORAL_TABLET | Freq: Two times a day (BID) | ORAL | Status: DC
  Administered 2017-10-05 – 2017-10-06 (×2): 650 mg via ORAL
  Filled 2017-10-01 (×8): qty 1

## 2017-10-01 MED ORDER — PANTOPRAZOLE SODIUM 40 MG PO TBEC
40.0000 mg | DELAYED_RELEASE_TABLET | Freq: Every day | ORAL | Status: DC
  Administered 2017-10-02 – 2017-10-07 (×6): 40 mg via ORAL
  Filled 2017-10-01 (×7): qty 1

## 2017-10-01 MED ORDER — PROMETHAZINE HCL 25 MG/ML IJ SOLN
12.5000 mg | Freq: Once | INTRAMUSCULAR | Status: AC
  Administered 2017-10-01: 12.5 mg via INTRAVENOUS
  Filled 2017-10-01: qty 1

## 2017-10-01 MED ORDER — VANCOMYCIN HCL IN DEXTROSE 1-5 GM/200ML-% IV SOLN: 1000.0000 mg | Freq: Once | INTRAVENOUS | Status: DC

## 2017-10-01 MED ORDER — ALBUTEROL SULFATE (2.5 MG/3ML) 0.083% IN NEBU
2.5000 mg | INHALATION_SOLUTION | RESPIRATORY_TRACT | Status: DC | PRN
Start: 2017-10-01 — End: 2017-10-07

## 2017-10-01 MED ORDER — HYDROCODONE-ACETAMINOPHEN 7.5-325 MG PO TABS
1.0000 | ORAL_TABLET | Freq: Four times a day (QID) | ORAL | Status: DC | PRN
  Filled 2017-10-01: qty 1

## 2017-10-01 MED ORDER — ONDANSETRON HCL 4 MG/2ML IJ SOLN: 4.0000 mg | Freq: Once | INTRAMUSCULAR | Status: DC

## 2017-10-01 MED ORDER — SODIUM CHLORIDE 0.9 % IV BOLUS (SEPSIS)
1000.0000 mL | Freq: Once | INTRAVENOUS | Status: AC
  Administered 2017-10-01: 1000 mL via INTRAVENOUS

## 2017-10-01 MED ORDER — SODIUM CHLORIDE 0.9 % IV SOLN
1.0000 g | INTRAVENOUS | Status: DC
  Filled 2017-10-01: qty 1

## 2017-10-01 MED ORDER — ERTAPENEM IV (FOR PTA / DISCHARGE USE ONLY): 1.0000 g | INTRAVENOUS | Status: DC

## 2017-10-01 MED ORDER — SPIRONOLACTONE 50 MG PO TABS
50.0000 mg | ORAL_TABLET | Freq: Every day | ORAL | Status: DC
  Filled 2017-10-01: qty 1
  Filled 2017-10-01: qty 2
  Filled 2017-10-01: qty 1

## 2017-10-01 MED ORDER — NYSTATIN 100000 UNIT/GM EX POWD
1.0000 g | Freq: Two times a day (BID) | CUTANEOUS | Status: DC | PRN
  Filled 2017-10-01: qty 15

## 2017-10-01 MED ORDER — PIPERACILLIN-TAZOBACTAM 3.375 G IVPB 30 MIN
3.3750 g | Freq: Once | INTRAVENOUS | Status: AC
  Administered 2017-10-01: 3.375 g via INTRAVENOUS
  Filled 2017-10-01: qty 50

## 2017-10-01 MED ORDER — VANCOMYCIN HCL IN DEXTROSE 1-5 GM/200ML-% IV SOLN: 1000.0000 mg | INTRAVENOUS | Status: DC

## 2017-10-01 MED ORDER — IOPAMIDOL (ISOVUE-300) INJECTION 61%
75.0000 mL | Freq: Once | INTRAVENOUS | Status: AC | PRN
  Administered 2017-10-01: 100 mL via INTRAVENOUS

## 2017-10-01 MED ORDER — MORPHINE SULFATE (PF) 4 MG/ML IV SOLN
4.0000 mg | Freq: Once | INTRAVENOUS | Status: DC
  Filled 2017-10-01: qty 1

## 2017-10-01 MED ORDER — ANIDULAFUNGIN IV (FOR PTA / DISCHARGE USE ONLY): 100.0000 mg | INTRAVENOUS | Status: DC

## 2017-10-01 MED ORDER — SODIUM CHLORIDE 0.9 % IV SOLN
100.0000 mg | INTRAVENOUS | Status: DC
  Filled 2017-10-01 (×4): qty 100

## 2017-10-01 MED ORDER — SIMETHICONE 80 MG PO CHEW: 80.0000 mg | CHEWABLE_TABLET | Freq: Four times a day (QID) | ORAL | Status: DC | PRN

## 2017-10-01 MED ORDER — PROMETHAZINE HCL 25 MG PO TABS
25.0000 mg | ORAL_TABLET | Freq: Two times a day (BID) | ORAL | Status: DC | PRN
  Administered 2017-10-03: 25 mg via ORAL
  Filled 2017-10-01: qty 1

## 2017-10-01 MED ORDER — IOPAMIDOL (ISOVUE-300) INJECTION 61%
INTRAVENOUS | Status: AC
  Filled 2017-10-01: qty 100

## 2017-10-01 MED ORDER — ACETAMINOPHEN 325 MG PO TABS
650.0000 mg | ORAL_TABLET | Freq: Once | ORAL | Status: AC
  Administered 2017-10-01: 650 mg via ORAL
  Filled 2017-10-01: qty 2

## 2017-10-01 NOTE — ED Provider Notes (Addendum)
[facility] DEPT Provider Note   CSN: [id number] Arrival date & time: 10/01/17  1006     History   Chief Complaint Chief Complaint  Patient presents with  . Fever (post op)    HPI Danielle [name] is a 71 y.o. female.  The history is provided by the patient, a relative and a caregiver.  She was recently discharged from hospital after laparotomy for intra-abdominal infection compensated by Candida bacteremia. She was sent home with PICC line and continuing antibiotics of Invanz and anidulafungin. This morning, she was noted to have fever of 102.9 at home. She denies chills or sweats. She denies change in abdominal pain related to her surgery.  Past Medical History:  Diagnosis Date  . Crohn disease ([place])   . Hiatal hernia 12/07/2015  . Left buttock abscess 04/21/2017  . Psoriasis   . SBO (small bowel obstruction) ([place]) 07/27/2017    Patient Active Problem List   Diagnosis Date Noted  . Pressure injury of skin 09/25/2017  . Hypoglycemia 09/25/2017  . Electrolyte imbalance 09/15/2017  . Encephalopathy acute 09/13/2017  . Anasarca 09/13/2017  . History of repair of hiatal hernia 09/11/2017  . Peritonitis ([place]) 09/11/2017  . Fungemia 09/11/2017  . Gastrostomy tube in place ([place]) 09/08/2017  . Protein-calorie malnutrition, severe 06/10/2017  . Acute lower UTI 06/09/2017  . Nausea and vomiting 06/08/2017  . 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 Chronic diarrhea 05/09/2017  . Infliximab (Remicade) long-term use 05/09/2017  . Schatzki's ring of distal esophagus 05/09/2017  . Anorexia 05/09/2017  . Weight loss, unintentional 05/09/2017  . Hiatal hernia with GERD 05/07/2017  . UTI (urinary tract infection) 05/07/2017  . Tachycardia 04/27/2017  . Perineal irritation in female 04/27/2017  . Mucosal irritation of oral cavity 04/27/2017  . Hypokalemia 04/27/2017  . Chest pain   . Facial swelling   . Right facial numbness   . Crohn disease ([place])  02/19/2017  . Immunosuppressed status ([place]) 12/09/2015  . 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 Incarcerated hiatal hernia s/p repair 09/08/2017 12/07/2015  . Sepsis due to pneumonia ([place]) 12/06/2015  . Acute renal failure superimposed on stage 3 chronic kidney disease ([place]) 12/06/2015  . Perianal Crohn's disease, with fistula ([place])   . Hypomagnesemia   . Leukocytosis 11/06/2011    Past Surgical History:  Procedure Laterality Date  . ANAL STENOSIS  ~ 2010  . APPENDECTOMY  1980's  . BOWEL RESECTION  2009  . CHOLECYSTECTOMY  1990's  . COLONOSCOPY  2013  . COLONOSCOPY WITH PROPOFOL N/A 03/12/2017   Procedure: COLONOSCOPY WITH PROPOFOL;  Surgeon: Rogene Houston, MD;  Location: [facility];  Service: Endoscopy;  Laterality: N/A;  2:05-moved up to 1025 per Lelon Frohlich  . COLOSTOMY  2009   DEMASO  . COLOSTOMY TAKEDOWN  2009   "only had it for about 1 month" (08/22/2013)  . ESOPHAGEAL DILATION N/A 03/12/2017   Procedure: ESOPHAGEAL DILATION;  Surgeon: Rogene Houston, MD;  Location: [facility];  Service: Endoscopy;  Laterality: N/A;  . ESOPHAGOGASTRODUODENOSCOPY  11/11/2011   Procedure: ESOPHAGOGASTRODUODENOSCOPY (EGD);  Surgeon: Missy Sabins, MD;  Location: [facility] ENDOSCOPY;  Service: Endoscopy;  Laterality: N/A;  . ESOPHAGOGASTRODUODENOSCOPY  05/18/2012   Procedure: ESOPHAGOGASTRODUODENOSCOPY (EGD);  Surgeon: Rogene Houston, MD;  [facility];  Service: Endoscopy;  Laterality: N/A;  200  . ESOPHAGOGASTRODUODENOSCOPY (EGD) WITH PROPOFOL N/A 03/12/2017   Procedure: ESOPHAGOGASTRODUODENOSCOPY (EGD) WITH PROPOFOL;  Surgeon: Rogene Houston, MD;  [facility];  Service: Endoscopy;  Laterality: N/A;  . FLEXIBLE SIGMOIDOSCOPY  11/11/2011   Procedure: FLEXIBLE SIGMOIDOSCOPY;  Surgeon: Missy Sabins, MD;  Location: [facility];  Service: Endoscopy;  Laterality: N/A;  . goiter  1999  . HIATAL HERNIA REPAIR N/A 09/08/2017   Procedure: DIAGNOSTIC LAPAROSCOPIC PLACEMENT OF gastrostomy TUBE AND GASTROPEXY;   Surgeon: Ralene Ok, MD;  Location: [facility];  Service: General;  Laterality: N/A;  . INCISION AND DRAINAGE PERIRECTAL ABSCESS N/A 04/21/2017   Procedure: IRRIGATION AND DEBRIDEMENT PERIRECTAL ABSCESS;  Surgeon: Coralie Keens, MD;  Location: [facility];  Service: General;  Laterality: N/A;  . LAPAROSCOPIC LYSIS OF ADHESIONS N/A 09/08/2017   Procedure: LAPAROSCOPIC LYSIS OF ADHESIONS;  Surgeon: Ralene Ok, MD;  Location: [facility];  Service: General;  Laterality: N/A;  . LAPAROTOMY N/A 09/10/2017   Procedure: Exploratory laparotomy with takedown of G tube, repair gastrostomy abdominal washout;  Surgeon: Clovis Riley, MD;  Location: [facility];  Service: General;  Laterality: N/A;  . LEFT HEART CATHETERIZATION WITH CORONARY ANGIOGRAM N/A 07/09/2014   Procedure: LEFT HEART CATHETERIZATION WITH CORONARY ANGIOGRAM;  Surgeon: Peter M Martinique, MD;  Location: [facility] CATH LAB;  Service: Cardiovascular;  Laterality: N/A;  . PROCTOSCOPY N/A 01/31/2015   Procedure: RIGID PROCTOSCOPY;  Surgeon: Leighton Ruff, MD;  Location: [facility];  Service: General;  Laterality: N/A;  . RECTAL EXAM UNDER ANESTHESIA N/A 01/31/2015   Procedure: RECTAL EXAM UNDER ANESTHESIA WITH PLACEMENT OF A SETON;  Surgeon: Leighton Ruff, MD;  Location: [facility];  Service: General;  Laterality: N/A;    OB History    No data available       Home Medications    Prior to Admission medications   Medication Sig Start Date End Date Taking? Authorizing Provider  albuterol (PROVENTIL HFA;VENTOLIN HFA) 108 (90 Base) MCG/ACT inhaler Inhale 2 puffs into the lungs every 6 (six) hours as needed for wheezing. 12/09/15   Rama, Venetia Maxon, MD  anidulafungin (ERAXIS) IVPB Inject 100 mg into the vein daily. Indication:  C glabrata fungemia & intra-abdominal abscess Last Day of Therapy:  10/10/17 Labs - Once weekly:  CBC/D and BMP, Labs - Every other week:  ESR and CRP 09/26/17 10/10/17  Ralene Ok, MD  capsicum oleoresin (TRIXAICIN) 0.025 % cream Apply  topically 3 (three) times daily. Patient taking differently: Apply 1 application topically 3 (three) times daily as needed (abscess).  06/15/17   Nita Sells, MD  cyanocobalamin (,VITAMIN B-12,) 1000 MCG/ML injection INJECT 1000 MCG IM ONCE MONTHLY 07/09/17   Rehman, Mechele Dawley, MD  dicyclomine (BENTYL) 10 MG/5ML syrup TAKE 10 MLS (20 MG TOTAL) BY MOUTH 3 (THREE) TIMES DAILY AS NEEDED. Patient taking differently: Take 20 mg by mouth 3 (three) times daily as needed (stomach cramps).  07/26/17   Rehman, Mechele Dawley, MD  diphenoxylate-atropine (LOMOTIL) 2.5-0.025 MG tablet TAKE 1 TABLET BY MOUTH FOUR TIMES A DAY AS NEEDED Patient taking differently: Take one tablet twice daily. 06/28/17   Setzer, Rona Ravens, NP  ertapenem (INVANZ) IVPB Inject 1 g into the vein daily. Indication:  Intra-abdominal abscess Last Day of Therapy:  10/10/17 Labs - Once weekly:  CBC/D and BMP, Labs - Every other week:  ESR and CRP 09/26/17 10/10/17  Ralene Ok, MD  furosemide (LASIX) 20 MG tablet Take 1 tablet (20 mg total) by mouth daily as needed (Take for weight gain of more than 2 pounds in a day or 5 pounds in 2 days.). 06/16/17   Lavina Hamman, MD  HYDROcodone-acetaminophen [facility])  7.5-325 MG tablet Take 1 tablet by mouth every 6 (six) hours as needed for moderate pain or severe pain. 09/26/17   Michael Boston, MD  nystatin (NYSTATIN) powder Apply 1 g topically 2 (two) times daily as needed (for itching/irritation).    [provider]  pantoprazole (PROTONIX) 40 MG tablet Take 1 tablet (40 mg total) by mouth daily. 06/16/17   Nita Sells, MD  promethazine (PHENERGAN) 25 MG tablet TAKE 1 TABLET (25 MG TOTAL) BY MOUTH 2 (TWO) TIMES DAILY AS NEEDED FOR NAUSEA OR VOMITING. 09/29/17   Rehman, Mechele Dawley, MD  Simethicone (PHAZYME PO) Take 1 tablet by mouth daily as needed (gas).     [provider]  sodium bicarbonate 650 MG tablet TAKE 1 TABLET (650 MG TOTAL) BY MOUTH 2 (TWO) TIMES DAILY. 05/14/17    [provider]  spironolactone (ALDACTONE) 50 MG tablet Take 50 mg by mouth daily.  06/23/17   [provider]    Family History Family History  Problem Relation Age of Onset  . Diabetes Mother   . Heart disease Father   . Healthy Sister   . Hypertension Brother   . Colon cancer Brother        died with colon cancer  . Crohn's disease Paternal Aunt     Social History Social History  Substance Use Topics  . Smoking status: Former Smoker    Packs/day: 0.12    Years: 4.00    Types: Cigarettes    Quit date: 10/05/1996  . Smokeless tobacco: Never Used     Comment: 08/22/2013 1 pack every two weeks when she did smoked  . Alcohol use No     Allergies   Zithromax [azithromycin]; Aspirin; Ibuprofen; Iron; and Sulfa antibiotics   Review of Systems Review of Systems  All other systems reviewed and are negative.    Physical Exam Updated Vital Signs BP 126/71 (BP Location: Left Arm)   Pulse (!) 119   Temp (!) 103 F (39.4 C) (Oral)   Resp (!) 24   SpO2 100%   Physical Exam  Nursing note and vitals reviewed.  Frail-appearing 71 year old female, resting comfortably and in no acute distress. Vital signs are significant for fever, tachypnea, tachycardia. Oxygen saturation is 100%, which is normal. Head is normocephalic and atraumatic. PERRLA, EOMI. Oropharynx is clear. Neck is nontender and supple without adenopathy or JVD. Back is nontender and there is no CVA tenderness. Lungs are clear without rales, wheezes, or rhonchi. Chest is nontender. Heart has regular rate and rhythm with 2/6 holosystolic murmur best heard along the left sternal border. Abdomen: Surgical wound is open and packed. No obvious drainage and tissue appears healthy without obvious infection. Moderate mid abdominal tenderness noted. There is no rebound or guarding. Peristalsis is hypoactive. Extremities have no cyanosis or edema, full range of motion is present. Skin is warm and dry  without rash. Neurologic: Mental status is normal, cranial nerves are intact, there are no motor or sensory deficits.  ED Treatments / Results  Labs (all labs ordered are listed, but only abnormal results are displayed) Labs Reviewed  COMPREHENSIVE METABOLIC PANEL - Abnormal; Notable for the following:       Result Value   Chloride 115 (*)    CO2 13 (*)    Creatinine, Ser 1.15 (*)    Calcium 8.6 (*)    Total Protein 8.4 (*)    Albumin 2.6 (*)    ALT 9 (*)    GFR calc  non Af Amer 47 (*)    GFR calc Af Amer 55 (*)    All other components within normal limits  CBC WITH DIFFERENTIAL/PLATELET - Abnormal; Notable for the following:    WBC 28.0 (*)    RBC 3.22 (*)    Hemoglobin 9.1 (*)    HCT 27.7 (*)    RDW 15.7 (*)    Platelets 461 (*)    Neutro Abs 19.1 (*)    Lymphs Abs 6.4 (*)    Monocytes Absolute 2.2 (*)    All other components within normal limits  I-STAT CG4 LACTIC ACID, ED - Abnormal; Notable for the following:    Lactic Acid, Venous 0.48 (*)    All other components within normal limits  CULTURE, BLOOD (ROUTINE X 2)  CULTURE, BLOOD (ROUTINE X 2)  PATHOLOGIST SMEAR REVIEW  URINALYSIS, ROUTINE W REFLEX MICROSCOPIC  I-STAT CG4 LACTIC ACID, ED    EKG  EKG Interpretation  Date/Time:  Friday October 01 2017 10:38:31 EDT Ventricular Rate:  122 PR Interval:    QRS Duration: 104 QT Interval:  315 QTC Calculation: 449 R Axis:   -74 Text Interpretation:  Sinus tachycardia Incomplete RBBB and LAFB Anterior infarct, old When compared with ECG of 09/10/2017, Left anterior fasicular block is now present Note:  Left anterior fasicular block was present on ECG 9/48/5462 Confirmed by Delora Fuel ([id number]) on 10/01/2017 11:35:02 AM       Radiology Ct Abdomen Pelvis W Contrast  Result Date: 10/01/2017 CLINICAL DATA:  Fever. Status post multiple recent surgical procedures including repair of a large paraesophageal hiatal hernia complicated by peritonitis, takedown of  gastrostomy, repair of gastrotomy, hepatorrhaphy and percutaneous catheter drainage of pelvic abscess. EXAM: CT ABDOMEN AND PELVIS WITH CONTRAST TECHNIQUE: Multidetector CT imaging of the abdomen and pelvis was performed using the standard protocol following bolus administration of intravenous contrast. CONTRAST:  16m ISOVUE-300 IOPAMIDOL (ISOVUE-300) INJECTION 61% COMPARISON:  09/24/2017 as well as multiple prior CT studies. FINDINGS: Lower chest: In the lower chest, there remains a moderate hiatal hernia with some residual small amount of complex fluid around the hernia. This appears improved compared to the prior study but is incompletely visualized. A mediastinal drain has been removed. There remains a moderate amount of pleural fluid at the left lung base. Given fever, there may be benefit to examining the entire chest for evidence of infection in the chest. Hepatobiliary: No focal liver abnormality is seen. Status post cholecystectomy. No biliary dilatation. Pancreas: Unremarkable. No pancreatic ductal dilatation or surrounding inflammatory changes. Spleen: Normal in size without focal abnormality. Adrenals/Urinary Tract: Adrenal glands are unremarkable. Kidneys are normal, without renal calculi, focal lesion, or hydronephrosis. Bladder is unremarkable. Stomach/Bowel: A few pelvic small bowel loops are moderately dilated and fluid-filled, likely consistent with ileus. Other small bowel loops and the colon are decompressed and there is no evidence of a significant bowel obstruction. No evidence of free intraperitoneal air or new focal abscess. Vascular/Lymphatic: No significant vascular findings are present. No enlarged abdominal or pelvic lymph nodes. Reproductive: Uterus and bilateral adnexa are unremarkable. Other: Right transgluteal percutaneous drainage catheter remains in place. The cul de sac abscess has essentially resolved with small residual amount of fluid remaining adjacent to the drainage  catheter. Musculoskeletal: No acute or significant osseous findings. IMPRESSION: 1. Less prominent complex fluid surrounding residual hiatal hernia in the lower chest. This is incompletely visualized, however and there remains a moderate left pleural effusion. Given no significant new findings in the abdomen or  pelvis, there may be benefit in obtaining a CT of the chest with contrast to examine the entire chest. 2. Some degree of ileus involving distal small bowel without evidence of significant bowel obstruction. No evidence of bowel perforation or new focal abscess. 3. Nearly completely decompressed and resolved pelvic abscess at the level of the cul de sac. Only a small amount of fluid remains adjacent to the indwelling pelvic drainage catheter. Electronically Signed   By: Aletta Edouard M.D.   On: 10/01/2017 13:00   Dg Chest Port 1 View  Result Date: 10/01/2017 CLINICAL DATA:  Fever today. Recent abdominal surgery. Nausea and vomiting. EXAM: PORTABLE CHEST 1 VIEW COMPARISON:  09/14/2017 and 09/11/2017 radiographs. FINDINGS: 1223 hours. Two views obtained. The nasogastric tube has been removed. The right arm PICC appears unchanged with the tip at the mid right atrial level. There is stable cardiomegaly. Patient has a known hiatal hernia. Left pleural effusion and left basilar opacity have increased. Otherwise, there is improved aeration of the lungs without definite residual edema. There is no pneumothorax. The bones appear unchanged. IMPRESSION: Interval resolution of pulmonary edema. Worsening left pleural effusion and left basilar opacity, likely atelectasis. Electronically Signed   By: Richardean Sale M.D.   On: 10/01/2017 12:46    Procedures Procedures (including critical care time) CRITICAL CARE Performed by: XBWIO,MBTDH Total critical care time: 45 minutes Critical care time was exclusive of separately billable procedures and treating other patients. Critical care was necessary to treat or  prevent imminent or life-threatening deterioration. Critical care was time spent personally by me on the following activities: development of treatment plan with patient and/or surrogate as well as nursing, discussions with consultants, evaluation of patient's response to treatment, examination of patient, obtaining history from patient or surrogate, ordering and performing treatments and interventions, ordering and review of laboratory studies, ordering and review of radiographic studies, pulse oximetry and re-evaluation of patient's condition.  Medications Ordered in ED Medications  sodium chloride 0.9 % bolus 1,000 mL (not administered)    And  sodium chloride 0.9 % bolus 1,000 mL (not administered)  piperacillin-tazobactam (ZOSYN) IVPB 3.375 g (not administered)  morphine 4 MG/ML injection 4 mg (not administered)  ondansetron (ZOFRAN) injection 4 mg (not administered)     Initial Impression / Assessment and Plan / ED Course  I have reviewed the triage vital signs and the nursing notes.  Pertinent labs & imaging results that were available during my care of the patient were reviewed by me and considered in my medical decision making (see chart for details).  Fever in patient with recent abdominal surgery and open abdominal wound. This is concerning because she is already on broad-spectrum antifungal medication and antibiotics. She started on sepsis pathway based on fever and tachycardia. Will get CT of abdomen and pelvis as well as chest x-ray. Complicated case, will likely need assistance from infectious disease.  CT seems to show general improvement in pre-existing fluid collections, ileus present. Pleural effusion present - recommendation for CT of chest. At this point, she just had received IV contrast for abdominal CT. I feel that CT of chest can be delayed until tomorrow to give kidneys arrest from IV dye. Lactic acid level has come back normal. CBC is markedly elevated to 20,000, but  without left shift. This could conceivably be a leukemoid reaction. She is noted to have a metabolic acidosis which is worsening. This is with a normal and anion gap. Cause is unclear. Thrombocytosis is likely reactive. Case is  discussed with Dr. Aggie Moats of [facility], who agrees to admit the patient.  Final Clinical Impressions(s) / ED Diagnoses   Final diagnoses:  Sepsis due to undetermined organism ([place])  Metabolic acidosis  Ileus ([place])  Normochromic normocytic anemia  Thrombocytosis ([place])    New Prescriptions New Prescriptions   No medications on file     Delora Fuel, MD 37/95/58 [id number]    Delora Fuel, MD 42/55/25 1455

## 2017-10-01 NOTE — H&P (Signed)
[facility] History and Physical  Danielle [name] QGB:[id number] DOB: Nov 29, 1946 DOA: 10/01/2017  Referring physician:  PCP: Iona Beard, MD   Chief Complaint: fever  HPI: Danielle [name] is a 71 y.o. female past medical history of Crohn's, psoriasis and recent discharge from hospital for drainage of an abdominal abscess and fungemia.  Patient represents due to fever.  Temperature at home was 102.9.  Patient states she has been feeling weak.  Denies any nausea vomiting.  Patient had been afebrile until today.  Patient has been compliant with taking her medications IV through her PICC line.  ED course: Patient given a dose of Zosyn.  Abdominal CT with contrast on the did not show recurrence of abscess.  Chest x-ray negative.  Hospitalist consulted for admission.   Review of Systems:  As per HPI otherwise 10 point review of systems negative.    Past Medical History:  Diagnosis Date  . Crohn disease ([place])   . Hiatal hernia 12/07/2015  . Left buttock abscess 04/21/2017  . Psoriasis   . SBO (small bowel obstruction) ([place]) 07/27/2017   Past Surgical History:  Procedure Laterality Date  . ANAL STENOSIS  ~ 2010  . APPENDECTOMY  1980's  . BOWEL RESECTION  2009  . CHOLECYSTECTOMY  1990's  . COLONOSCOPY  2013  . COLONOSCOPY WITH PROPOFOL N/A 03/12/2017   Procedure: COLONOSCOPY WITH PROPOFOL;  Surgeon: Rogene Houston, MD;  [facility];  Service: Endoscopy;  Laterality: N/A;  2:05-moved up to 1025 per Lelon Frohlich  . COLOSTOMY  2009   DEMASO  . COLOSTOMY TAKEDOWN  2009   "only had it for about 1 month" (08/22/2013)  . ESOPHAGEAL DILATION N/A 03/12/2017   Procedure: ESOPHAGEAL DILATION;  Surgeon: Rogene Houston, MD;  [facility]: [facility];  Service: Endoscopy;  Laterality: N/A;  . ESOPHAGOGASTRODUODENOSCOPY  11/11/2011   Procedure: ESOPHAGOGASTRODUODENOSCOPY (EGD);  Surgeon: Missy Sabins, MD;  [facility] ENDOSCOPY;  Service: Endoscopy;  Laterality: N/A;  .  ESOPHAGOGASTRODUODENOSCOPY  05/18/2012   Procedure: ESOPHAGOGASTRODUODENOSCOPY (EGD);  Surgeon: Rogene Houston, MD;  [facility];  Service: Endoscopy;  Laterality: N/A;  200  . ESOPHAGOGASTRODUODENOSCOPY (EGD) WITH PROPOFOL N/A 03/12/2017   Procedure: ESOPHAGOGASTRODUODENOSCOPY (EGD) WITH PROPOFOL;  Surgeon: Rogene Houston, MD;  [facility]: [facility];  Service: Endoscopy;  Laterality: N/A;  . FLEXIBLE SIGMOIDOSCOPY  11/11/2011   Procedure: FLEXIBLE SIGMOIDOSCOPY;  Surgeon: Missy Sabins, MD;  Location: [place];  Service: Endoscopy;  Laterality: N/A;  . goiter  1999  . HIATAL HERNIA REPAIR N/A 09/08/2017   Procedure: DIAGNOSTIC LAPAROSCOPIC PLACEMENT OF gastrostomy TUBE AND GASTROPEXY;  Surgeon: Ralene Ok, MD;  Location: [facility];  Service: General;  Laterality: N/A;  . INCISION AND DRAINAGE PERIRECTAL ABSCESS N/A 04/21/2017   Procedure: IRRIGATION AND DEBRIDEMENT PERIRECTAL ABSCESS;  Surgeon: Coralie Keens, MD;  Location: [place];  Service: General;  Laterality: N/A;  . LAPAROSCOPIC LYSIS OF ADHESIONS N/A 09/08/2017   Procedure: LAPAROSCOPIC LYSIS OF ADHESIONS;  Surgeon: Ralene Ok, MD;  Location: [facility];  Service: General;  Laterality: N/A;  . LAPAROTOMY N/A 09/10/2017   Procedure: Exploratory laparotomy with takedown of G tube, repair gastrostomy abdominal washout;  Surgeon: Clovis Riley, MD;  Location: [facility];  Service: General;  Laterality: N/A;  . LEFT HEART CATHETERIZATION WITH CORONARY ANGIOGRAM N/A 07/09/2014   Procedure: LEFT HEART CATHETERIZATION WITH CORONARY ANGIOGRAM;  Surgeon: Peter M Martinique, MD;  Location: [facility] CATH LAB;  Service: Cardiovascular;  Laterality: N/A;  .  PROCTOSCOPY N/A 01/31/2015   Procedure: RIGID PROCTOSCOPY;  Surgeon: Leighton Ruff, MD;  Location: [place];  Service: General;  Laterality: N/A;  . RECTAL EXAM UNDER ANESTHESIA N/A 01/31/2015   Procedure: RECTAL EXAM UNDER ANESTHESIA WITH PLACEMENT OF A SETON;  Surgeon: Leighton Ruff, MD;   Location: [place];  Service: General;  Laterality: N/A;   Social History:  reports that she quit smoking about 21 years ago. Her smoking use included Cigarettes. She has a 0.48 pack-year smoking history. She has never used smokeless tobacco. She reports that she does not drink alcohol or use drugs.  Allergies  Allergen Reactions  . Zithromax [Azithromycin] Swelling and Other (See Comments)    Mouth swelling and blisters   . Aspirin Other (See Comments)    Stomach cramping   . Ibuprofen Other (See Comments)    Pt states that she prefers not to take this medication.   . Iron Nausea And Vomiting and Other (See Comments)    FERROUS SULFATE  . Sulfa Antibiotics Nausea And Vomiting    Family History  Problem Relation Age of Onset  . Diabetes Mother   . Heart disease Father   . Healthy Sister   . Hypertension Brother   . Colon cancer Brother        died with colon cancer  . Crohn's disease Paternal Aunt      Prior to Admission medications   Medication Sig Start Date End Date Taking? Authorizing Provider  albuterol (PROVENTIL HFA;VENTOLIN HFA) 108 (90 Base) MCG/ACT inhaler Inhale 2 puffs into the lungs every 6 (six) hours as needed for wheezing. 12/09/15  Yes Rama, Venetia Maxon, MD  anidulafungin (ERAXIS) IVPB Inject 100 mg into the vein daily. Indication:  C glabrata fungemia & intra-abdominal abscess Last Day of Therapy:  10/10/17 Labs - Once weekly:  CBC/D and BMP, Labs - Every other week:  ESR and CRP 09/26/17 10/10/17 Yes Ralene Ok, MD  capsicum oleoresin (TRIXAICIN) 0.025 % cream Apply topically 3 (three) times daily. Patient taking differently: Apply 1 application topically 3 (three) times daily as needed (abscess).  06/15/17  Yes Nita Sells, MD  cyanocobalamin (,VITAMIN B-12,) 1000 MCG/ML injection INJECT 1000 MCG IM ONCE MONTHLY 07/09/17  Yes Rehman, Mechele Dawley, MD  dicyclomine (BENTYL) 10 MG/5ML syrup TAKE 10 MLS (20 MG TOTAL) BY MOUTH 3 (THREE) TIMES DAILY AS  NEEDED. Patient taking differently: Take 20 mg by mouth 3 (three) times daily as needed (stomach cramps).  07/26/17  Yes Rehman, Mechele Dawley, MD  diphenoxylate-atropine (LOMOTIL) 2.5-0.025 MG tablet TAKE 1 TABLET BY MOUTH FOUR TIMES A DAY AS NEEDED Patient taking differently: Take one tablet twice daily. 06/28/17  Yes Setzer, Rona Ravens, NP  ertapenem (INVANZ) IVPB Inject 1 g into the vein daily. Indication:  Intra-abdominal abscess Last Day of Therapy:  10/10/17 Labs - Once weekly:  CBC/D and BMP, Labs - Every other week:  ESR and CRP 09/26/17 10/10/17 Yes Ralene Ok, MD  furosemide (LASIX) 20 MG tablet Take 1 tablet (20 mg total) by mouth daily as needed (Take for weight gain of more than 2 pounds in a day or 5 pounds in 2 days.). 06/16/17  Yes Lavina Hamman, MD  HYDROcodone-acetaminophen (NORCO) 7.5-325 MG tablet Take 1 tablet by mouth every 6 (six) hours as needed for moderate pain or severe pain. 09/26/17  Yes Michael Boston, MD  nystatin (NYSTATIN) powder Apply 1 g topically 2 (two) times daily as needed (for itching/irritation).   Yes [provider]  pantoprazole (PROTONIX) 40 MG tablet Take 1 tablet (40 mg total) by mouth daily. 06/16/17  Yes Nita Sells, MD  promethazine (PHENERGAN) 25 MG tablet TAKE 1 TABLET (25 MG TOTAL) BY MOUTH 2 (TWO) TIMES DAILY AS NEEDED FOR NAUSEA OR VOMITING. 09/29/17  Yes Rehman, Mechele Dawley, MD  Simethicone (PHAZYME PO) Take 1 tablet by mouth daily as needed (gas).    Yes [provider]  sodium bicarbonate 650 MG tablet Take 650 mg by mouth 2 (two) times daily.   Yes [provider]  spironolactone (ALDACTONE) 50 MG tablet Take 50 mg by mouth daily.  06/23/17  Yes [provider]   Physical Exam: Vitals:   10/01/17 1012 10/01/17 1026 10/01/17 1053 10/01/17 1504  BP:  126/71  107/72  Pulse:  (!) 119  (!) 114  Resp:  (!) 24  (!) 21  Temp:  (!) 103 F (39.4 C) (!) 103.8 F (39.9 C)   TempSrc:  Oral Rectal   SpO2: 94%  100%  100%    Wt Readings from Last 3 Encounters:  09/25/17 64.2 kg (141 lb 8.6 oz)  09/02/17 60.5 kg (133 lb 6 oz)  08/26/17 59 kg (130 lb)    General:  Appears calm and comfortable, A&Ox3 Eyes:  PERRL, EOMI, normal lids, iris ENT:  grossly normal hearing, lips & tongue Neck:  no LAD, masses or thyromegaly Cardiovascular:  RRR, no m/r/g. No LE edema.  Respiratory:  CTA bilaterally, no w/r/r. Normal respiratory effort. Abdomen:  soft, ntnd; dressing CDI Skin:  no rash or induration seen on limited exam Musculoskeletal:  grossly normal tone BUE/BLE Psychiatric:  grossly normal mood and affect, speech fluent and appropriate Neurologic:  CN 2-12 grossly intact, moves all extremities in coordinated fashion.          Labs on Admission:  Basic Metabolic Panel:  Recent Labs Lab 09/25/17 0455 09/26/17 0410 09/26/17 0416 10/01/17 1127  NA 139 138  --  141  K 2.8* 3.4*  --  3.6  CL 115* 114*  --  115*  CO2 17* 16*  --  13*  GLUCOSE 109* 107*  --  69  BUN <5* <5*  --  9  CREATININE 1.18* 1.13*  --  1.15*  CALCIUM 8.1* 8.3*  --  8.6*  MG 1.5*  --  2.2  --    Liver Function Tests:  Recent Labs Lab 09/25/17 0455 10/01/17 1127  AST 16 17  ALT 10* 9*  ALKPHOS 86 120  BILITOT 0.8 1.1  PROT 7.0 8.4*  ALBUMIN 2.3* 2.6*   No results for input(s): LIPASE, AMYLASE in the last 168 hours. No results for input(s): AMMONIA in the last 168 hours. CBC:  Recent Labs Lab 09/25/17 0455 10/01/17 1127  WBC 15.1* 28.0*  NEUTROABS 8.1* 19.1*  HGB 8.2* 9.1*  HCT 24.7* 27.7*  MCV 87.0 86.0  PLT 455* 461*   Cardiac Enzymes: No results for input(s): CKTOTAL, CKMB, CKMBINDEX, TROPONINI in the last 168 hours.  BNP (last 3 results)  Recent Labs  05/07/17 0024  BNP 49.3    ProBNP (last 3 results) No results for input(s): PROBNP in the last 8760 hours.   Serum creatinine: 1.15 mg/dL (H) 10/01/17 1127 Estimated creatinine clearance: 40.7 mL/min (A)  CBG:  Recent Labs Lab  09/26/17 0850 09/26/17 1152 09/26/17 1550 09/26/17 1611 09/26/17 1639  GLUCAP 178* 77 59* 68 70    Radiological Exams on Admission: Ct Abdomen Pelvis W Contrast  Result Date: 10/01/2017 CLINICAL  DATA:  Fever. 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 EXAM: CT ABDOMEN AND PELVIS WITH CONTRAST TECHNIQUE: Multidetector CT imaging of the abdomen and pelvis was performed using the standard protocol following bolus administration of intravenous contrast. CONTRAST:  167m ISOVUE-300 IOPAMIDOL (ISOVUE-300) INJECTION 61% COMPARISON:  09/24/2017 as well as multiple prior CT studies. FINDINGS: Lower chest: In the lower chest, there remains a moderate hiatal hernia with some residual small amount of complex fluid around the hernia. This appears improved compared to the prior study but is incompletely visualized. A mediastinal drain has been removed. There remains a moderate amount of pleural fluid at the left lung base. Given fever, there may be benefit to examining the entire chest for evidence of infection in the chest. Hepatobiliary: No focal liver abnormality is seen. Status post cholecystectomy. No biliary dilatation. Pancreas: Unremarkable. No pancreatic ductal dilatation or surrounding inflammatory changes. Spleen: Normal in size without focal abnormality. Adrenals/Urinary Tract: Adrenal glands are unremarkable. Kidneys are normal, without renal calculi, focal lesion, or hydronephrosis. Bladder is unremarkable. Stomach/Bowel: A few pelvic small bowel loops are moderately dilated and fluid-filled, likely consistent with ileus. Other small bowel loops and the colon are decompressed and there is no evidence of a significant bowel obstruction. No evidence of free intraperitoneal air or new focal abscess.  Vascular/Lymphatic: No significant vascular findings are present. No enlarged abdominal or pelvic lymph nodes. Reproductive: Uterus and bilateral adnexa are unremarkable. Other: Right transgluteal percutaneous drainage catheter remains in place. The cul de sac abscess has essentially resolved with small residual amount of fluid remaining adjacent to the drainage catheter. Musculoskeletal: No acute or significant osseous findings. IMPRESSION: 1. Less prominent complex fluid surrounding residual hiatal hernia in the lower chest. This is incompletely visualized, however and there remains a moderate left pleural effusion. Given no significant new findings in the abdomen or pelvis, there may be benefit in obtaining a CT of the chest with contrast to examine the entire chest. 2. Some degree of ileus involving distal small bowel without evidence of significant bowel obstruction. No evidence of bowel perforation or new focal abscess. 3. Nearly completely decompressed and resolved pelvic abscess at the level of the cul de sac. Only a small amount of fluid remains adjacent to the indwelling pelvic drainage catheter. Electronically Signed   By: GAletta EdouardM.D.   On: 10/01/2017 13:00   Dg Chest Port 1 View  Result Date: 10/01/2017 CLINICAL DATA:  Fever today. Recent abdominal surgery. Nausea and vomiting. EXAM: PORTABLE CHEST 1 VIEW COMPARISON:  09/14/2017 and 09/11/2017 radiographs. FINDINGS: 1223 hours. Two views obtained. The nasogastric tube has been removed. The right arm PICC appears unchanged with the tip at the mid right atrial level. There is stable cardiomegaly. Patient has a known hiatal hernia. Left pleural effusion and left basilar opacity have increased. Otherwise, there is improved aeration of the lungs without definite residual edema. There is no pneumothorax. The bones appear unchanged. IMPRESSION: Interval resolution of pulmonary edema. Worsening left pleural effusion and left basilar opacity, likely  atelectasis. Electronically Signed   By: WRichardean SaleM.D.   On: 10/01/2017 12:46    EKG: Sinus tach.  Complete right bundle branch block.  No STEMI.  Assessment/Plan Active Problems:   SIRS (systemic inflammatory response syndrome) (HCC)   SIRS 2/2 unk Patient hemodynamically stable Ordered invanz, eraxis and vanc, will continue  Given zoosyn in the emergency room, x1 Urine culture, UA pending pending Blood cultures 2 pending Patient given 2000 mL of fluid in the emergency room Lactic acid acceptable ID consult  Fever Vas [place] of RUE ordered  Low bicarb  Cont oral suppl  Crohn's Prn norco for abd pain  Code Status: FC  DVT Prophylaxis: SCDs Family Communication: none at bedside Disposition Plan: Pending Improvement  Status: obs tele  Elwin Mocha, MD Family Medicine [facility] [email or web address] Password [email or web address]

## 2017-10-01 NOTE — Progress Notes (Addendum)
Pharmacy Antibiotic Note  Danielle [name] is a 71 y.o. female admitted earlier this month with paraesophageal hiatal hernia s/p laparoscopic G-tube placement and gastropexy, LOA. Post-op ex lap revealed frank pus in abdomen. Patient was started on broad abx for peritonitis and fungemia.  Blood cultures and drain grew out Candida glabrata.  Discharged 10/21 with PICC line for daily administration of Invanz and Eraxis through 11/4.  Patient presents 10/26 with fever.  Pharmacy consulted to continue PTA antimicrobial course and add vancomycin.  Patient also received a dose of Zosyn in the ED earlier today.  Today is day #21 of broad spectrum antibiotics and antifungal coverage.  Plan: - Continue Invanz 1g IV q24h - Continue Eraxis 100 mg IV q24h  - Vancomycin 1g IV q24h. - Daily SCr. __________________________     Temp (24hrs), Avg:103.4 F (39.7 C), Min:103 F (39.4 C), Max:103.8 F (39.9 C)   Recent Labs Lab 09/25/17 0455 09/26/17 0410 10/01/17 1127 10/01/17 1134 10/01/17 1349  WBC 15.1*  --  28.0*  --   --   CREATININE 1.18* 1.13* 1.15*  --   --   LATICACIDVEN  --   --   --  0.64 0.48*    Estimated Creatinine Clearance: 40.7 mL/min (A) (by C-G formula based on SCr of 1.15 mg/dL (H)).    Allergies  Allergen Reactions  . Zithromax [Azithromycin] Swelling and Other (See Comments)    Mouth swelling and blisters   . Aspirin Other (See Comments)    Stomach cramping   . Ibuprofen Other (See Comments)    Pt states that she prefers not to take this medication.   . Iron Nausea And Vomiting and Other (See Comments)    FERROUS SULFATE  . Sulfa Antibiotics Nausea And Vomiting    Antimicrobials this admission: 10/3 Cefazolin x1 dose preop 10/3 Vanc x1 preop, then daily starting 10/4 >> 10/5 10/4 Zosyn >> 10/21 10/5 Zyvox >> 10/11  10/5 Eraxis >> 10/21 Ertapenem >> 10/26 Vancomycin >>  Dose adjustments this admission:   Microbiology results: 10/4 BCx: 2/2 bottles w/ C  glabrata (MIC fluconazole=32) 10/5 MRSA PCR: pos 10/5 UCx: NGF 10/10 BCx: NG-F 10/11 C. diff: neg/neg 10/12: pelvic abscess: C. glabrata  Thank you for allowing pharmacy to be a part of this patient's care.  Hershal Coria, PharmD, BCPS Pager: [phone] 10/01/2017 3:53 PM

## 2017-10-01 NOTE — ED Triage Notes (Signed)
Per EMS, pt is coming from her house after her home health nurse took her temperature 102.9. Pt had surgery on an abdominal abscess on 10/3 and returned home on 10/21. Pt reports having low-grade intermittent fevers since returning home. Pt has a picc line in place and is receiving antifungal and antibiotics medications at this time. Pt reports n/v earlier this morning.

## 2017-10-01 NOTE — Progress Notes (Signed)
Received patient from ED to floor. Oriented to room. Condition stable. Hand off report given to night shift RN. Eulas Post, RN

## 2017-10-01 NOTE — Consult Note (Signed)
[facility]ious Disease    Date of Admission:  10/01/2017   Total days of antibiotics: 21        vanco day 0,         invanz day 7        anidulafungin day 21               Reason for Consult: Fever    Referring Provider: Aggie Moats   Assessment: Fever Peritonitis Previous Pelvic abscess Pleural effusion Ileus Recent Fungemia (C glabrata R- fluconazole) CKD3 Protein Calorie Malnutrition ? gout  Plan: 1. Add vanco, rule out PIC infection 2. Treat ileus 3. Repeat BCx for PIC infection and to assure that fungemia is resolved.  4. Cr stable, continue to watch with addition of vancomycin 5. Nutrition eval 6. eval LE for gout (would be unusual to be bilateral)? 7. If she continues to have fever, pull PIC 8. Consider doppler of her RUE at her [facility] site to r/o DVT  Thank you so much for this interesting consult,  Active Problems:   SIRS (systemic inflammatory response syndrome) ([place])   .  morphine injection  4 mg Intravenous Once  . pantoprazole  40 mg Oral Daily  . sodium bicarbonate  650 mg Oral BID  . spironolactone  50 mg Oral Daily    HPI: Danielle [name] is a 71 y.o. female s/p extensive hospitalization earlier this month for hiatal hernia- lap gastropexy and lysis of adhesions giving rise to sepsis, pneumoperitonitis/mediastinum. She was found to have a leak at her gastrostomy tube (repiared) and was started on vanco/zosyn. By 10-6 she was found to be fungemic (C glabrata). Her abscess Cx grew C albicans and C glabrata. She was started on anidulafungin.  She was d/c to home on 10-21 with a plan for 2 additional weeks of antibiotics, antifungals.  By 10-24 she had fever 101 at home. She was given tylonol and improved. She had no fever on 10-25 and then today she developed fever again. She was brought to ED. She was found to have improvement of her prev fluid collections, abscesses on her CT scan. Her WBC was 28 and her temp was 103.     Review  of Systems: Review of Systems  Constitutional: Positive for fever. Negative for chills and weight loss.  Respiratory: Positive for cough. Negative for shortness of breath.   Cardiovascular: Positive for leg swelling.  Gastrointestinal: Negative for abdominal pain, constipation, diarrhea, nausea and vomiting.  Musculoskeletal: Positive for joint pain.  Endo/Heme/Allergies: Negative for polydipsia.  she has occas cough. Denies SOB.  She has had no problems with her PIC line, no erythema or d/c.  She is asking for boost and ice chips.  Please see HPI. All other systems reviewed and negative.   Past Medical History:  Diagnosis Date  . Crohn disease ([place])   . Hiatal hernia 12/07/2015  . Left buttock abscess 04/21/2017  . Psoriasis   . SBO (small bowel obstruction) ([place]) 07/27/2017    Social History  Substance Use Topics  . Smoking status: Former Smoker    Packs/day: 0.12    Years: 4.00    Types: Cigarettes    Quit date: 10/05/1996  . Smokeless tobacco: Never Used     Comment: 08/22/2013 1 pack every two weeks when she did smoked  . Alcohol use No    Family History  Problem Relation Age of Onset  . Diabetes Mother   .  Heart disease Father   . Healthy Sister   . Hypertension Brother   . Colon cancer Brother        died with colon cancer  . Crohn's disease Paternal Aunt      Medications:  Scheduled: .  morphine injection  4 mg Intravenous Once  . pantoprazole  40 mg Oral Daily  . sodium bicarbonate  650 mg Oral BID  . spironolactone  50 mg Oral Daily    Abtx:  Anti-infectives    Start     Dose/Rate Route Frequency Ordered Stop   10/02/17 1800  vancomycin (VANCOCIN) IVPB 1000 mg/200 mL premix     1,000 mg 200 mL/hr over 60 Minutes Intravenous Every 24 hours 10/01/17 1718     10/01/17 1800  ertapenem (INVANZ) 1 g in sodium chloride 0.9 % 50 mL IVPB     1 g 100 mL/hr over 30 Minutes Intravenous Every 24 hours 10/01/17 1601     10/01/17 1700  anidulafungin (ERAXIS)  100 mg in sodium chloride 0.9 % 100 mL IVPB     100 mg 78 mL/hr over 100 Minutes Intravenous Every 24 hours 10/01/17 1601     10/01/17 1700  vancomycin (VANCOCIN) IVPB 1000 mg/200 mL premix     1,000 mg 200 mL/hr over 60 Minutes Intravenous  Once 10/01/17 1656     10/01/17 1600  anidulafungin (ERAXIS) IVPB  Status:  Discontinued    Comments:  Indication:  C glabrata fungemia & intra-abdominal abscess Last Day of Therapy:  10/10/17 Labs - Once weekly:  CBC/D and BMP, Labs - Every other week:  ESR and CRP     100 mg Intravenous Every 24 hours 10/01/17 1547 10/01/17 1601   10/01/17 1600  ertapenem (INVANZ) IVPB  Status:  Discontinued    Comments:  Indication:  Intra-abdominal abscess Last Day of Therapy:  10/10/17 Labs - Once weekly:  CBC/D and BMP, Labs - Every other week:  ESR and CRP     1 g Intravenous Every 24 hours 10/01/17 1547 10/01/17 1601   10/01/17 1100  piperacillin-tazobactam (ZOSYN) IVPB 3.375 g     3.375 g 100 mL/hr over 30 Minutes Intravenous  Once 10/01/17 1046 10/01/17 1657        OBJECTIVE: Blood pressure 105/63, pulse (!) 104, temperature (!) 103.8 F (39.9 C), temperature source Rectal, resp. rate (!) 29, SpO2 99 %.  Physical Exam  Constitutional: She appears malnourished. She appears to not be writhing in pain. She appears unhealthy. No distress.  Eyes: Pupils are equal, round, and reactive to light. EOM are normal.  Neck: Neck supple.  Cardiovascular: Normal rate, regular rhythm and normal heart sounds.   Pulmonary/Chest: Effort normal and breath sounds normal.  Abdominal: Soft. Bowel sounds are normal. She exhibits no distension. There is no tenderness.  Her upper abd wound is open/dressed. She has no d/c. Skin is pink.   Musculoskeletal:       Arms:      Feet:  Lymphadenopathy:    She has no cervical adenopathy.  homan's sign negative  Lab Results Results for orders placed or performed during the hospital encounter of 10/01/17 (from the past 48  hour(s))  Comprehensive metabolic panel     Status: Abnormal   Collection Time: 10/01/17 11:27 AM  Result Value Ref Range   Sodium 141 135 - 145 mmol/L   Potassium 3.6 3.5 - 5.1 mmol/L   Chloride 115 (H) 101 - 111 mmol/L   CO2 13 (L) 22 -  32 mmol/L   Glucose, Bld 69 65 - 99 mg/dL   BUN 9 6 - 20 mg/dL   Creatinine, Ser 1.15 (H) 0.44 - 1.00 mg/dL   Calcium 8.6 (L) 8.9 - 10.3 mg/dL   Total Protein 8.4 (H) 6.5 - 8.1 g/dL   Albumin 2.6 (L) 3.5 - 5.0 g/dL   AST 17 15 - 41 U/L   ALT 9 (L) 14 - 54 U/L   Alkaline Phosphatase 120 38 - 126 U/L   Total Bilirubin 1.1 0.3 - 1.2 mg/dL   GFR calc non Af Amer 47 (L) >60 mL/min   GFR calc Af Amer 55 (L) >60 mL/min    Comment: (NOTE) The eGFR has been calculated using the CKD EPI equation. This calculation has not been validated in all clinical situations. eGFR's persistently <60 mL/min signify possible Chronic Kidney Disease.    Anion gap 13 5 - 15  CBC WITH DIFFERENTIAL     Status: Abnormal   Collection Time: 10/01/17 11:27 AM  Result Value Ref Range   WBC 28.0 (H) 4.0 - 10.5 K/uL   RBC 3.22 (L) 3.87 - 5.11 MIL/uL   Hemoglobin 9.1 (L) 12.0 - 15.0 g/dL   HCT 27.7 (L) 36.0 - 46.0 %   MCV 86.0 78.0 - 100.0 fL   MCH 28.3 26.0 - 34.0 pg   MCHC 32.9 30.0 - 36.0 g/dL   RDW 15.7 (H) 11.5 - 15.5 %   Platelets 461 (H) 150 - 400 K/uL   Neutrophils Relative % 68 %   Lymphocytes Relative 23 %   Monocytes Relative 8 %   Eosinophils Relative 1 %   Basophils Relative 0 %   Neutro Abs 19.1 (H) 1.7 - 7.7 K/uL   Lymphs Abs 6.4 (H) 0.7 - 4.0 K/uL   Monocytes Absolute 2.2 (H) 0.1 - 1.0 K/uL   Eosinophils Absolute 0.3 0.0 - 0.7 K/uL   Basophils Absolute 0.0 0.0 - 0.1 K/uL   RBC Morphology POLYCHROMASIA PRESENT    WBC Morphology ABSOLUTE LYMPHOCYTOSIS   I-Stat CG4 Lactic Acid, ED  (not at  [facility])     Status: None   Collection Time: 10/01/17 11:34 AM  Result Value Ref Range   Lactic Acid, Venous 0.64 0.5 - 1.9 mmol/L  Pathologist smear review      Status: None   Collection Time: 10/01/17 12:00 PM  Result Value Ref Range   Path Review Reviewed By Violet Baldy, M.D.     Comment: 10.26.18 NORMOCYTIC ANEMIA,LEUKOCYTOSIS, THROMBOCYTOSIS.   I-Stat CG4 Lactic Acid, ED  (not at  [facility])     Status: Abnormal   Collection Time: 10/01/17  1:49 PM  Result Value Ref Range   Lactic Acid, Venous 0.48 (L) 0.5 - 1.9 mmol/L      Component Value Date/Time   SDES ABSCESS PELVIC 09/17/2017 1930   SPECREQUEST NONE 09/17/2017 1930   CULT  09/17/2017 1930    MODERATE CANDIDA GLABRATA MODERATE CANDIDA ALBICANS NO ANAEROBES ISOLATED Performed at [facility], [place] [address] [place], [place] [address]    REPTSTATUS 09/23/2017 FINAL 09/17/2017 1930   Ct Abdomen Pelvis W Contrast  Result Date: 10/01/2017 CLINICAL DATA:  Fever. 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 EXAM: CT ABDOMEN AND PELVIS WITH CONTRAST TECHNIQUE: Multidetector CT imaging of the abdomen and pelvis was performed using the standard protocol  following bolus administration of intravenous contrast. CONTRAST:  120m ISOVUE-300 IOPAMIDOL (ISOVUE-300) INJECTION 61% COMPARISON:  09/24/2017 as well as multiple prior CT studies. FINDINGS: Lower chest: In the lower chest, there remains a moderate hiatal hernia with some residual small amount of complex fluid around the hernia. This appears improved compared to the prior study but is incompletely visualized. A mediastinal drain has been removed. There remains a moderate amount of pleural fluid at the left lung base. Given fever, there may be benefit to examining the entire chest for evidence of infection in the chest. Hepatobiliary: No focal liver abnormality is seen. Status post cholecystectomy. No biliary dilatation. Pancreas:  Unremarkable. No pancreatic ductal dilatation or surrounding inflammatory changes. Spleen: Normal in size without focal abnormality. Adrenals/Urinary Tract: Adrenal glands are unremarkable. Kidneys are normal, without renal calculi, focal lesion, or hydronephrosis. Bladder is unremarkable. Stomach/Bowel: A few pelvic small bowel loops are moderately dilated and fluid-filled, likely consistent with ileus. Other small bowel loops and the colon are decompressed and there is no evidence of a significant bowel obstruction. No evidence of free intraperitoneal air or new focal abscess. Vascular/Lymphatic: No significant vascular findings are present. No enlarged abdominal or pelvic lymph nodes. Reproductive: Uterus and bilateral adnexa are unremarkable. Other: Right transgluteal percutaneous drainage catheter remains in place. The cul de sac abscess has essentially resolved with small residual amount of fluid remaining adjacent to the drainage catheter. Musculoskeletal: No acute or significant osseous findings. IMPRESSION: 1. Less prominent complex fluid surrounding residual hiatal hernia in the lower chest. This is incompletely visualized, however and there remains a moderate left pleural effusion. Given no significant new findings in the abdomen or pelvis, there may be benefit in obtaining a CT of the chest with contrast to examine the entire chest. 2. Some degree of ileus involving distal small bowel without evidence of significant bowel obstruction. No evidence of bowel perforation or new focal abscess. 3. Nearly completely decompressed and resolved pelvic abscess at the level of the cul de sac. Only a small amount of fluid remains adjacent to the indwelling pelvic drainage catheter. Electronically Signed   By: GAletta EdouardM.D.   On: 10/01/2017 13:00   Dg Chest Port 1 View  Result Date: 10/01/2017 CLINICAL DATA:  Fever today. Recent abdominal surgery. Nausea and vomiting. EXAM: PORTABLE CHEST 1 VIEW  COMPARISON:  09/14/2017 and 09/11/2017 radiographs. FINDINGS: 1223 hours. Two views obtained. The nasogastric tube has been removed. The right arm PICC appears unchanged with the tip at the mid right atrial level. There is stable cardiomegaly. Patient has a known hiatal hernia. Left pleural effusion and left basilar opacity have increased. Otherwise, there is improved aeration of the lungs without definite residual edema. There is no pneumothorax. The bones appear unchanged. IMPRESSION: Interval resolution of pulmonary edema. Worsening left pleural effusion and left basilar opacity, likely atelectasis. Electronically Signed   By: WRichardean SaleM.D.   On: 10/01/2017 12:46   No results found for this or any previous visit (from the past 240 hour(s)).  Microbiology: No results found for this or any previous visit (from the past 240 hour(s)).  Radiographs and labs were personally reviewed by me.   JBobby Rumpf MD FSt. Vincent'S Blountfor Infectious DBeaverheadGroup [phone]/26/2018, 5:21 PM

## 2017-10-01 NOTE — Progress Notes (Signed)
A consult was received from an ED physician for Zosyn per pharmacy dosing.  The patient's profile has been reviewed for ht/wt/allergies/indication/available labs.   A one time order has been placed for 3.375g.  Further antibiotics/pharmacy consults should be ordered by admitting physician if indicated.                       Thank you, Kara Mead 10/01/2017  10:50 AM

## 2017-10-01 NOTE — ED Notes (Signed)
Call report to [place] at [phone] at [phone]

## 2017-10-01 NOTE — Progress Notes (Signed)
[facility]  Active pt with [facility] prior to this readmission.  [facility] is providing HH and Home Infusion Pharmacy services for pt.  Pt is on IV ABX at home prior to this admission.  [facility] willl follow pt while in the hospital to support DC home when ordered.  If patient discharges after hours, please call [phone].   [name] 10/01/2017, 12:56 PM

## 2017-10-02 ENCOUNTER — Observation Stay (HOSPITAL_COMMUNITY): Payer: Medicare HMO

## 2017-10-02 DIAGNOSIS — E876 Hypokalemia: Secondary | ICD-10-CM | POA: Diagnosis present

## 2017-10-02 DIAGNOSIS — Z87898 Personal history of other specified conditions: Secondary | ICD-10-CM | POA: Diagnosis not present

## 2017-10-02 DIAGNOSIS — B9689 Other specified bacterial agents as the cause of diseases classified elsewhere: Secondary | ICD-10-CM

## 2017-10-02 DIAGNOSIS — J90 Pleural effusion, not elsewhere classified: Secondary | ICD-10-CM | POA: Diagnosis present

## 2017-10-02 DIAGNOSIS — I82A19 Acute embolism and thrombosis of unspecified axillary vein: Secondary | ICD-10-CM | POA: Diagnosis not present

## 2017-10-02 DIAGNOSIS — L02211 Cutaneous abscess of abdominal wall: Secondary | ICD-10-CM | POA: Diagnosis not present

## 2017-10-02 DIAGNOSIS — E860 Dehydration: Secondary | ICD-10-CM | POA: Diagnosis present

## 2017-10-02 DIAGNOSIS — D72829 Elevated white blood cell count, unspecified: Secondary | ICD-10-CM

## 2017-10-02 DIAGNOSIS — Z881 Allergy status to other antibiotic agents status: Secondary | ICD-10-CM | POA: Diagnosis not present

## 2017-10-02 DIAGNOSIS — K659 Peritonitis, unspecified: Secondary | ICD-10-CM | POA: Diagnosis not present

## 2017-10-02 DIAGNOSIS — Z87891 Personal history of nicotine dependence: Secondary | ICD-10-CM | POA: Diagnosis not present

## 2017-10-02 DIAGNOSIS — N179 Acute kidney failure, unspecified: Secondary | ICD-10-CM | POA: Diagnosis present

## 2017-10-02 DIAGNOSIS — Z882 Allergy status to sulfonamides status: Secondary | ICD-10-CM | POA: Diagnosis not present

## 2017-10-02 DIAGNOSIS — E872 Acidosis: Secondary | ICD-10-CM | POA: Diagnosis present

## 2017-10-02 DIAGNOSIS — R6510 Systemic inflammatory response syndrome (SIRS) of non-infectious origin without acute organ dysfunction: Secondary | ICD-10-CM | POA: Diagnosis present

## 2017-10-02 DIAGNOSIS — M7989 Other specified soft tissue disorders: Secondary | ICD-10-CM

## 2017-10-02 DIAGNOSIS — T82818A Embolism of vascular prosthetic devices, implants and grafts, initial encounter: Secondary | ICD-10-CM | POA: Diagnosis present

## 2017-10-02 DIAGNOSIS — R509 Fever, unspecified: Secondary | ICD-10-CM

## 2017-10-02 DIAGNOSIS — X58XXXA Exposure to other specified factors, initial encounter: Secondary | ICD-10-CM | POA: Diagnosis present

## 2017-10-02 DIAGNOSIS — E870 Hyperosmolality and hypernatremia: Secondary | ICD-10-CM | POA: Diagnosis present

## 2017-10-02 DIAGNOSIS — M79609 Pain in unspecified limb: Secondary | ICD-10-CM

## 2017-10-02 DIAGNOSIS — Z8249 Family history of ischemic heart disease and other diseases of the circulatory system: Secondary | ICD-10-CM | POA: Diagnosis not present

## 2017-10-02 DIAGNOSIS — K5090 Crohn's disease, unspecified, without complications: Secondary | ICD-10-CM | POA: Diagnosis present

## 2017-10-02 DIAGNOSIS — Z833 Family history of diabetes mellitus: Secondary | ICD-10-CM | POA: Diagnosis not present

## 2017-10-02 DIAGNOSIS — I82A11 Acute embolism and thrombosis of right axillary vein: Secondary | ICD-10-CM | POA: Diagnosis not present

## 2017-10-02 DIAGNOSIS — E440 Moderate protein-calorie malnutrition: Secondary | ICD-10-CM | POA: Diagnosis present

## 2017-10-02 DIAGNOSIS — Z886 Allergy status to analgesic agent status: Secondary | ICD-10-CM | POA: Diagnosis not present

## 2017-10-02 DIAGNOSIS — D473 Essential (hemorrhagic) thrombocythemia: Secondary | ICD-10-CM | POA: Diagnosis not present

## 2017-10-02 DIAGNOSIS — Z9049 Acquired absence of other specified parts of digestive tract: Secondary | ICD-10-CM | POA: Diagnosis not present

## 2017-10-02 DIAGNOSIS — Z6825 Body mass index (BMI) 25.0-25.9, adult: Secondary | ICD-10-CM | POA: Diagnosis not present

## 2017-10-02 DIAGNOSIS — A419 Sepsis, unspecified organism: Secondary | ICD-10-CM | POA: Diagnosis not present

## 2017-10-02 DIAGNOSIS — N183 Chronic kidney disease, stage 3 (moderate): Secondary | ICD-10-CM | POA: Diagnosis present

## 2017-10-02 DIAGNOSIS — D649 Anemia, unspecified: Secondary | ICD-10-CM | POA: Diagnosis present

## 2017-10-02 DIAGNOSIS — I82622 Acute embolism and thrombosis of deep veins of left upper extremity: Secondary | ICD-10-CM | POA: Diagnosis not present

## 2017-10-02 DIAGNOSIS — I82629 Acute embolism and thrombosis of deep veins of unspecified upper extremity: Secondary | ICD-10-CM | POA: Diagnosis not present

## 2017-10-02 DIAGNOSIS — K567 Ileus, unspecified: Secondary | ICD-10-CM | POA: Diagnosis present

## 2017-10-02 DIAGNOSIS — Z800 Family history of malignant neoplasm of digestive organs: Secondary | ICD-10-CM | POA: Diagnosis not present

## 2017-10-02 DIAGNOSIS — I82621 Acute embolism and thrombosis of deep veins of right upper extremity: Secondary | ICD-10-CM | POA: Diagnosis present

## 2017-10-02 LAB — CREATININE, SERUM
CREATININE: 1.21 mg/dL — AB (ref 0.44–1.00)
GFR, EST AFRICAN AMERICAN: 52 mL/min — AB (ref >60)
GFR, EST NON AFRICAN AMERICAN: 45 mL/min — AB (ref >60)

## 2017-10-02 MED ORDER — ACETAMINOPHEN 650 MG RE SUPP
650.0000 mg | Freq: Four times a day (QID) | RECTAL | Status: DC | PRN
  Administered 2017-10-03 – 2017-10-04 (×2): 650 mg via RECTAL
  Filled 2017-10-02 (×3): qty 1

## 2017-10-02 MED ORDER — SPIRONOLACTONE 25 MG PO TABS
50.0000 mg | ORAL_TABLET | Freq: Every day | ORAL | Status: DC
  Administered 2017-10-03 – 2017-10-06 (×3): 50 mg via ORAL
  Filled 2017-10-02 (×6): qty 2

## 2017-10-02 MED ORDER — KETOROLAC TROMETHAMINE 15 MG/ML IJ SOLN
15.0000 mg | Freq: Once | INTRAMUSCULAR | Status: AC
  Administered 2017-10-02: 15 mg via INTRAVENOUS
  Filled 2017-10-02: qty 1

## 2017-10-02 MED ORDER — PROMETHAZINE HCL 25 MG/ML IJ SOLN
12.5000 mg | Freq: Four times a day (QID) | INTRAMUSCULAR | Status: DC | PRN
Start: 2017-10-02 — End: 2017-10-03
  Administered 2017-10-02: 12.5 mg via INTRAVENOUS
  Administered 2017-10-03: 6.25 mg via INTRAVENOUS
  Filled 2017-10-02 (×2): qty 1

## 2017-10-02 NOTE — Progress Notes (Signed)
RN contacted NP on call for verification regarding use of current PICC line to give antibiotics that were ordered in the ED. NP placed order stating to refer to ID progress note. Infectious disease note refers to possible DVT from PICC. RN contacted IV team RN and Anesthesia RN to place peripheral IV. Neither was successful and patient began refusing for further attempts. NP on call was notified that no peripheral IV was placed and that antibiotics were not given. Pharmacy also made aware. Will continue to monitor patient.

## 2017-10-02 NOTE — Progress Notes (Signed)
Pharmacy Note:  Patient has been off IV antimicrobials since admission (except for Zosyn x 1 dose) because of lack of usable IV access.  Dr. Henreitta Leber note from earlier today indicated ok from ID standpoint to use the PICC.   Contacted RN and offered to reschedule ertapenem, vancomycin, and Eraxis to allow earlier restart of these today.  She indicated need to verify usability of line and will call later this PM with update.  Clayburn Pert, PharmD, BCPS Pager: [phone] 10/02/2017  1:44 PM

## 2017-10-02 NOTE — Progress Notes (Signed)
Patient ID: Danielle [name], female   DOB: 1946-06-02, 71 y.o.   MRN: [id number]  CT abscess drain placed 09/17/17 Home with antibiotics and antifungals (cx showed albicans)  Now back in hospital for fever; leukocytosis Right arm pain CT yesterday IMPRESSION: 1. Less prominent complex fluid surrounding residual hiatal hernia in the lower chest. This is incompletely visualized, however and there remains a moderate left pleural effusion. Given no significant new findings in the abdomen or pelvis, there may be benefit in obtaining a CT of the chest with contrast to examine the entire chest. 2. Some degree of ileus involving distal small bowel without evidence of significant bowel obstruction. No evidence of bowel perforation or new focal abscess. 3. Nearly completely decompressed and resolved pelvic abscess at the level of the cul de sac. Only a small amount of fluid remains adjacent to the indwelling pelvic drainage catheter.   101.5 Wbc 28 Not sure of OP from drain as pt returning to [facility] just yesterday  Will ask RN to flush drain and record OP for 24 hrs Review CT and case with Dr Vernard Gambles  Consider removal then

## 2017-10-02 NOTE — Progress Notes (Signed)
JP dressing to buttock abscess drain saturated with bloody/serosanqueonous drainage. 7 cc of fluid in actual bulb. Drsg changed. Tube flushed with 5 cc NS per IR order every shift. Flushed well. Page put in to IR on call. Will continue to monitor. Sister states this has been happening at home. Eulas Post, RN

## 2017-10-02 NOTE — Progress Notes (Signed)
Provider contacted regarding PICC line RUA. Per provider if pt is negative for clotting at PICC site it will be ok for IV RN to access for continued antibiotic therapy. If PICC area positive for clot, site to be discontinued and pt will be observed off antibiotic therapy for 24 hours. Provider to reevaluate in the am.

## 2017-10-02 NOTE — Progress Notes (Addendum)
Preliminary results : Positive for an acute DVT forming along the PICC line in the axillary vein.  [facility], [place] 10/02/2017 3:40 PM

## 2017-10-02 NOTE — Progress Notes (Signed)
Patient ID: PIXIE BURGENER, female   DOB: 1946/06/13, 71 y.o.   MRN: [id number]    PROGRESS NOTE    ROYALTI SCHAUF  IRC:[id number] DOB: 1946-02-16 DOA: 10/01/2017  PCP: Iona Beard, MD   Brief Narrative:  Pt is 71 yo female who was just recently hospitalized from 10/03 until 09/26/2017 when she underwent surgery to type IV paraesophageal hiatal hernia, and required re operation due to leakage from gastrostomy tube. Postoperative course complicated by required intubation and pressor support, pt was extubated by post op day #2. She continued to improved, tolerated parenteral nutrition, however, eventually developed pelvic fluid collection and has required drain placement, on 10/06 pt found to be fungemic with blood cultures + for C. Glabrata and abscess culture + for C. Albicans and C. Glabrata.  Per d/c summary, pt was able to ambulate with PT, was improving, weaned off parenteral nutrition and tolerated diet, pain controlled. ID recommended Invanz and anidulafungin for at least two more weeks post discharge. Pt was discharged with PICC line placed in R upper arm.   She presented back [facility] ED for evaluation of intermittent fevers up to 101 F at home that she first noted on 10/24. In ED, CT scan notable for an improvement in pelvic fluid collection, Tmax 103, WBC 28 K.   Assessment & Plan: Sepsis  - developed on ertapenem - ? LLL PNA (on CXR left basilar opacity and pleural effusion noted), clinically pt with no cough and specific pulmonary concerns  - depending on clinical status, can evaluate with CT chest  - ABX per infectious disease, vancomycin and anidulafungin  - pt has poor IV access, PICC line in right upper arm is apparently not working per [employer], will need to remove this PICC line, I will discuss with ID Dr. Linus Salmons - follow up on blood cultures   Abd abscess, C.  Albicans and C. Glabrata + - CT abd noting resolution - will discuss with IR if JP drain can be removed   Right arm  pain - at the site of PICC line - possibly occluded, will remove the line - ask for US doppler r/o blot clot   Acute kidney injury - pre renal - monitor    DVT prophylaxis: Lovenox Sq Code Status: Full  Family Communication: Patient at bedside  Disposition Plan: to be determined   Consultants:   IR  ID  Procedures:   None  Antimicrobials:   Anidulafungin - day # 21  Invanz - day #8  Zosyn 10/04 - 10/20  Subjective: Pt with ongoing fevers, wants to eat this AM.   Objective: Vitals:   10/01/17 1730 10/01/17 1902 10/02/17 0519 10/02/17 1217  BP: 103/62 (!) 95/58 114/60 (!) 120/59  Pulse:  (!) 102 88 97  Resp: (!) 25 (!) 22 20 (!) 22  Temp:  98.9 F (37.2 C) 99.6 F (37.6 C) (!) 101.5 F (38.6 C)  TempSrc:  Oral Oral Oral  SpO2:  100% 97% 99%  Weight:   68 kg (149 lb 14.6 oz)     Intake/Output Summary (Last 24 hours) at 10/02/17 1255 Last data filed at 10/02/17 0521  Gross per 24 hour  Intake               50 ml  Output              102 ml  Net              -52 ml   [employer]  10/02/17 0519  Weight: 68 kg (149 lb 14.6 oz)    Examination:  General exam: Appears calm, NAD Respiratory system: Respiratory effort normal.Diminished breath sounds at bases  Cardiovascular system: tachycardic, no murmurs  Gastrointestinal system: Abdomen is nondistended, soft and nontender. Open wound in upper abd area, no drainage noted Central nervous system: Alert. No focal neurological deficits. Extremities: Symmetric 5 x 5 power.  Data Reviewed: I have personally reviewed following labs and imaging studies  CBC:  Recent Labs Lab 10/01/17 1127  WBC 28.0*  NEUTROABS 19.1*  HGB 9.1*  HCT 27.7*  MCV 86.0  PLT 696*   Basic Metabolic Panel:  Recent Labs Lab 09/26/17 0410 09/26/17 0416 10/01/17 1127 10/02/17 0645  NA 138  --  141  --   K 3.4*  --  3.6  --   CL 114*  --  115*  --   CO2 16*  --  13*  --   GLUCOSE 107*  --  69  --   BUN <5*  --  9   --   CREATININE 1.13*  --  1.15* 1.21*  CALCIUM 8.3*  --  8.6*  --   MG  --  2.2  --   --    Liver Function Tests:  Recent Labs Lab 10/01/17 1127  AST 17  ALT 9*  ALKPHOS 120  BILITOT 1.1  PROT 8.4*  ALBUMIN 2.6*   CBG:  Recent Labs Lab 09/26/17 0850 09/26/17 1152 09/26/17 1550 09/26/17 1611 09/26/17 1639  GLUCAP 178* 77 59* 68 70   Urine analysis:    Component Value Date/Time   COLORURINE YELLOW 09/23/2017 [place] 09/23/2017 0849   LABSPEC 1.008 09/23/2017 0849   PHURINE 7.0 09/23/2017 0849   GLUCOSEU NEGATIVE 09/23/2017 0849   HGBUR SMALL (A) 09/23/2017 0849   BILIRUBINUR NEGATIVE 09/23/2017 [place] 09/23/2017 0849   PROTEINUR NEGATIVE 09/23/2017 0849   UROBILINOGEN 0.2 07/29/2015 2231   NITRITE NEGATIVE 09/23/2017 0849   LEUKOCYTESUR NEGATIVE 09/23/2017 0849   Recent Results (from the past 240 hour(s))  Blood Culture (routine x 2)     Status: None (Preliminary result)   Collection Time: 10/01/17 11:27 AM  Result Value Ref Range Status   Specimen Description BLOOD RIGHT ARM  Final   Special Requests IN PEDIATRIC BOTTLE Blood Culture adequate volume  Final   Culture   Final    NO GROWTH < 24 HOURS Performed at [facility], [place] [address] [place], [place] [address]    Report Status PENDING  Incomplete  Blood Culture (routine x 2)     Status: None (Preliminary result)   Collection Time: 10/01/17  6:55 PM  Result Value Ref Range Status   Specimen Description BLOOD LEFT HAND  Final   Special Requests IN PEDIATRIC BOTTLE Blood Culture adequate volume  Final   Culture   Final    NO GROWTH < 12 HOURS Performed at [facility], [place] [address], [place], [place] [address]    Report Status PENDING  Incomplete    Radiology Studies: Ct Abdomen Pelvis W Contrast  Result Date: 10/01/2017 CLINICAL DATA:  Fever. Status post multiple recent surgical procedures including repair of a large paraesophageal hiatal  hernia complicated by peritonitis, takedown of gastrostomy, repair of gastrotomy, hepatorrhaphy and percutaneous catheter drainage of pelvic abscess. EXAM: CT ABDOMEN AND PELVIS WITH CONTRAST TECHNIQUE: Multidetector CT imaging of the abdomen and pelvis was performed using the standard protocol following  bolus administration of intravenous contrast. CONTRAST:  122mL ISOVUE-300 IOPAMIDOL (ISOVUE-300) INJECTION 61% COMPARISON:  09/24/2017 as well as multiple prior CT studies. FINDINGS: Lower chest: In the lower chest, there remains a moderate hiatal hernia with some residual small amount of complex fluid around the hernia. This appears improved compared to the prior study but is incompletely visualized. A mediastinal drain has been removed. There remains a moderate amount of pleural fluid at the left lung base. Given fever, there may be benefit to examining the entire chest for evidence of infection in the chest. Hepatobiliary: No focal liver abnormality is seen. Status post cholecystectomy. No biliary dilatation. Pancreas: Unremarkable. No pancreatic ductal dilatation or surrounding inflammatory changes. Spleen: Normal in size without focal abnormality. Adrenals/Urinary Tract: Adrenal glands are unremarkable. Kidneys are normal, without renal calculi, focal lesion, or hydronephrosis. Bladder is unremarkable. Stomach/Bowel: A few pelvic small bowel loops are moderately dilated and fluid-filled, likely consistent with ileus. Other small bowel loops and the colon are decompressed and there is no evidence of a significant bowel obstruction. No evidence of free intraperitoneal air or new focal abscess. Vascular/Lymphatic: No significant vascular findings are present. No enlarged abdominal or pelvic lymph nodes. Reproductive: Uterus and bilateral adnexa are unremarkable. Other: Right transgluteal percutaneous drainage catheter remains in place. The cul de sac abscess has essentially resolved with small residual amount of  fluid remaining adjacent to the drainage catheter. Musculoskeletal: No acute or significant osseous findings. IMPRESSION: 1. Less prominent complex fluid surrounding residual hiatal hernia in the lower chest. This is incompletely visualized, however and there remains a moderate left pleural effusion. Given no significant new findings in the abdomen or pelvis, there may be benefit in obtaining a CT of the chest with contrast to examine the entire chest. 2. Some degree of ileus involving distal small bowel without evidence of significant bowel obstruction. No evidence of bowel perforation or new focal abscess. 3. Nearly completely decompressed and resolved pelvic abscess at the level of the cul de sac. Only a small amount of fluid remains adjacent to the indwelling pelvic drainage catheter. Electronically Signed   By: Aletta Edouard M.D.   On: 10/01/2017 13:00   Dg Chest Port 1 View  Result Date: 10/01/2017 CLINICAL DATA:  Fever today. Recent abdominal surgery. Nausea and vomiting. EXAM: PORTABLE CHEST 1 VIEW COMPARISON:  09/14/2017 and 09/11/2017 radiographs. FINDINGS: 1223 hours. Two views obtained. The nasogastric tube has been removed. The right arm PICC appears unchanged with the tip at the mid right atrial level. There is stable cardiomegaly. Patient has a known hiatal hernia. Left pleural effusion and left basilar opacity have increased. Otherwise, there is improved aeration of the lungs without definite residual edema. There is no pneumothorax. The bones appear unchanged. IMPRESSION: Interval resolution of pulmonary edema. Worsening left pleural effusion and left basilar opacity, likely atelectasis. Electronically Signed   By: Richardean Sale M.D.   On: 10/01/2017 12:46    Scheduled Meds: .  morphine injection  4 mg Intravenous Once  . pantoprazole  40 mg Oral Daily  . sodium bicarbonate  650 mg Oral BID  . spironolactone  50 mg Oral Daily   Continuous Infusions: . anidulafungin    .  vancomycin    . vancomycin       LOS: 0 days   Time spent: 25 minutes   Faye Ramsay, MD [facility] Pager [phone]  If 7PM-7AM, please contact night-coverage [email or web address] Password [email or web address] 10/02/2017, 12:55 PM

## 2017-10-02 NOTE — Evaluation (Signed)
Clinical/Bedside Swallow Evaluation Patient Details  Name: Danielle [name] MRN: [id number] Date of Birth: 04/16/1946  Today's Date: 10/02/2017 Time: SLP Start Time (ACUTE ONLY): 1655 SLP Stop Time (ACUTE ONLY): 1710 SLP Time Calculation (min) (ACUTE ONLY): 15 min  Past Medical History:  Past Medical History:  Diagnosis Date  . Crohn disease ([place])   . Hiatal hernia 12/07/2015  . Left buttock abscess 04/21/2017  . Psoriasis   . SBO (small bowel obstruction) ([name]) 07/27/2017   Past Surgical History:  Past Surgical History:  Procedure Laterality Date  . ANAL STENOSIS  ~ 2010  . APPENDECTOMY  1980's  . BOWEL RESECTION  2009  . CHOLECYSTECTOMY  1990's  . COLONOSCOPY  2013  . COLONOSCOPY WITH PROPOFOL N/A 03/12/2017   Procedure: COLONOSCOPY WITH PROPOFOL;  Surgeon: Rogene Houston, MD;  [facility];  Service: Endoscopy;  Laterality: N/A;  2:05-moved up to 1025 per Lelon Frohlich  . COLOSTOMY  2009   DEMASO  . COLOSTOMY TAKEDOWN  2009   "only had it for about 1 month" (08/22/2013)  . ESOPHAGEAL DILATION N/A 03/12/2017   Procedure: ESOPHAGEAL DILATION;  Surgeon: Rogene Houston, MD;  [facility];  Service: Endoscopy;  Laterality: N/A;  . ESOPHAGOGASTRODUODENOSCOPY  11/11/2011   Procedure: ESOPHAGOGASTRODUODENOSCOPY (EGD);  Surgeon: Missy Sabins, MD;  Location[facility];  Service: Endoscopy;  Laterality: N/A;  . ESOPHAGOGASTRODUODENOSCOPY  05/18/2012   Procedure: ESOPHAGOGASTRODUODENOSCOPY (EGD);  Surgeon: Rogene Houston, MD;  [facility];  Service: Endoscopy;  Laterality: N/A;  200  . ESOPHAGOGASTRODUODENOSCOPY (EGD) WITH PROPOFOL N/A 03/12/2017   Procedure: ESOPHAGOGASTRODUODENOSCOPY (EGD) WITH PROPOFOL;  Surgeon: Rogene Houston, MD;  [facility];  Service: Endoscopy;  Laterality: N/A;  . FLEXIBLE SIGMOIDOSCOPY  11/11/2011   Procedure: FLEXIBLE SIGMOIDOSCOPY;  Surgeon: Missy Sabins, MD;  Location: [place];  Service: Endoscopy;  Laterality: N/A;  .  goiter  1999  . HIATAL HERNIA REPAIR N/A 09/08/2017   Procedure: DIAGNOSTIC LAPAROSCOPIC PLACEMENT OF gastrostomy TUBE AND GASTROPEXY;  Surgeon: Ralene Ok, MD;  Location: [facility];  Service: General;  Laterality: N/A;  . INCISION AND DRAINAGE PERIRECTAL ABSCESS N/A 04/21/2017   Procedure: IRRIGATION AND DEBRIDEMENT PERIRECTAL ABSCESS;  Surgeon: Coralie Keens, MD;  Location: [place];  Service: General;  Laterality: N/A;  . LAPAROSCOPIC LYSIS OF ADHESIONS N/A 09/08/2017   Procedure: LAPAROSCOPIC LYSIS OF ADHESIONS;  Surgeon: Ralene Ok, MD;  Location: [facility];  Service: General;  Laterality: N/A;  . LAPAROTOMY N/A 09/10/2017   Procedure: Exploratory laparotomy with takedown of G tube, repair gastrostomy abdominal washout;  Surgeon: Clovis Riley, MD;  Location: [facility];  Service: General;  Laterality: N/A;  . LEFT HEART CATHETERIZATION WITH CORONARY ANGIOGRAM N/A 07/09/2014   Procedure: LEFT HEART CATHETERIZATION WITH CORONARY ANGIOGRAM;  Surgeon: Peter M Martinique, MD;  Location: [facility] LAB;  Service: Cardiovascular;  Laterality: N/A;  . PROCTOSCOPY N/A 01/31/2015   Procedure: RIGID PROCTOSCOPY;  Surgeon: Leighton Ruff, MD;  Location: [place];  Service: General;  Laterality: N/A;  . RECTAL EXAM UNDER ANESTHESIA N/A 01/31/2015   Procedure: RECTAL EXAM UNDER ANESTHESIA WITH PLACEMENT OF A SETON;  Surgeon: Leighton Ruff, MD;  Location: [place];  Service: General;  Laterality: N/A;   HPI:  Pt is 71 yo female who was just recently hospitalized from 10/03 until 09/26/2017 when she underwent surgery to type IV paraesophageal hiatal hernia, and required re operation due to leakage from gastrostomy tube. Postoperative course complicated by required  intubation and pressor support, pt was extubated by post op day #2. She continued to improved, tolerated parenteral nutrition, however, eventually developed pelvic fluid collection and has required drain placement, on 10/06 pt found to be fungemic with blood  cultures + for C. Glabrataand abscess culture + for C. Albicansand C. Standley Dakins. Per MD ? LLL PNA (on CXR left basilar opacity and pleural effusion noted), clinically pt with no cough and specific pulmonary concerns.    Assessment / Plan / Recommendation Clinical Impression  Pt presents with normal oropharyngeal swallow function with appearance of adequate airway protection at bedside. Swallow function appears consistent with prior evaluations; mild belching, globus sensation while is suggestive of primary esophageal dysphagia. Pt independently verbalizes strategies she utilizes to manage GERD. Recommend regular diet with thin liquids, meds whole with liquid. At this time no further skilled ST follow-up warranted. Will s/o. SLP Visit Diagnosis: Dysphagia, unspecified (R13.10)    Aspiration Risk  Mild aspiration risk    Diet Recommendation Regular;Thin liquid   Liquid Administration via: Cup;Straw Medication Administration: Whole meds with liquid Supervision: Patient able to self feed Compensations: Slow rate;Small sips/bites;Follow solids with liquid Postural Changes: Seated upright at 90 degrees;Remain upright for at least 30 minutes after po intake    Other  Recommendations Oral Care Recommendations: Oral care BID   Follow up Recommendations None      Frequency and Duration            Prognosis Prognosis for Safe Diet Advancement: Good      Swallow Study   General Date of Onset: 10/01/17 HPI: Pt is 71 yo female who was just recently hospitalized from 10/03 until 09/26/2017 when she underwent surgery to type IV paraesophageal hiatal hernia, and required re operation due to leakage from gastrostomy tube. Postoperative course complicated by required intubation and pressor support, pt was extubated by post op day #2. She continued to improved, tolerated parenteral nutrition, however, eventually developed pelvic fluid collection and has required drain placement, on 10/06 pt found to  be fungemic with blood cultures + for C. Glabrataand abscess culture + for C. Albicansand C. Standley Dakins. Per MD ? LLL PNA (on CXR left basilar opacity and pleural effusion noted), clinically pt with no cough and specific pulmonary concerns.  Type of Study: Bedside Swallow Evaluation Previous Swallow Assessment: Prior bedside assessments during recent admission with findings of normal oropharyngeal swallow, suspected primary esophageal dysphagia. Reg diet /thin liquids recommended, most recently 05/12/17.  Diet Prior to this Study: Dysphagia 3 (soft);Thin liquids Temperature Spikes Noted: No Respiratory Status: Room air History of Recent Intubation: No Behavior/Cognition: Alert;Cooperative;Pleasant mood Oral Cavity Assessment: Within Functional Limits Oral Care Completed by SLP: Yes Oral Cavity - Dentition: Dentures, top Vision: Functional for self-feeding Self-Feeding Abilities: Able to feed self Patient Positioning: Upright in bed Baseline Vocal Quality: Normal Volitional Cough: Strong Volitional Swallow: Able to elicit    Oral/Motor/Sensory Function Overall Oral Motor/Sensory Function: Within functional limits   Ice Chips Ice chips: Within functional limits   Thin Liquid Thin Liquid: Within functional limits Presentation: Cup;Straw;Self Fed    Nectar Thick Nectar Thick Liquid: Not tested   Honey Thick Honey Thick Liquid: Not tested   Puree Puree: Within functional limits Presentation: Self Fed;Spoon   Solid   GO   Solid: Within functional limits Presentation: Self Fed    Functional Assessment Tool Used: skilled clinical judgment Functional Limitations: Swallowing Swallow Current Status (X1062): At least 1 percent but less than 20 percent impaired, limited or restricted Swallow  Goal Status [phone]): At least 1 percent but less than 20 percent impaired, limited or restricted Swallow Discharge Status [phone]): At least 1 percent but less than 20 percent impaired, limited or  restricted   Aliene Altes 10/02/2017,5:25 PM  Deneise Lever, [place], [place] Speech-Language Pathologist [phone]

## 2017-10-02 NOTE — Progress Notes (Addendum)
    [facility] for Infectious Disease   Reason for visit: Follow up on fever, leukocytosis  Interval History: no fever since yesterday; CT with near resolution of abscess; no pain, no chills.   Antibiotic days 22 anidulafungin 21 days invanz day 8 Pip-tazo 10/4 - 10/20   Physical Exam: Constitutional:  Vitals:   10/01/17 1902 10/02/17 0519  BP: (!) 95/58 114/60  Pulse: (!) 102 88  Resp: (!) 22 20  Temp: 98.9 F (37.2 C) 99.6 F (37.6 C)  SpO2: 100% 97%   patient appears in NAD, chronically ill appearing Eyes: anicteric HENT: no thrush Respiratory: Normal respiratory effort; CTA B, anterior exam Cardiovascular: RRR GI: soft, nt  Review of Systems: Constitutional: negative for chills Integument/breast: negative for rash  Lab Results  Component Value Date   WBC 28.0 (H) 10/01/2017   HGB 9.1 (L) 10/01/2017   HCT 27.7 (L) 10/01/2017   MCV 86.0 10/01/2017   PLT 461 (H) 10/01/2017    Lab Results  Component Value Date   CREATININE 1.21 (H) 10/02/2017   BUN 9 10/01/2017   NA 141 10/01/2017   K 3.6 10/01/2017   CL 115 (H) 10/01/2017   CO2 13 (L) 10/01/2017    Lab Results  Component Value Date   ALT 9 (L) 10/01/2017   AST 17 10/01/2017   ALKPHOS 120 10/01/2017     Microbiology: Recent Results (from the past 240 hour(s))  Blood Culture (routine x 2)     Status: None (Preliminary result)   Collection Time: 10/01/17 11:27 AM  Result Value Ref Range Status   Specimen Description BLOOD RIGHT ARM  Final   Special Requests IN PEDIATRIC BOTTLE Blood Culture adequate volume  Final   Culture   Final    NO GROWTH < 24 HOURS Performed at [facility], [address] [place], [place] [address]    Report Status PENDING  Incomplete  Blood Culture (routine x 2)     Status: None (Preliminary result)   Collection Time: 10/01/17  6:55 PM  Result Value Ref Range Status   Specimen Description BLOOD LEFT HAND  Final   Special Requests IN PEDIATRIC BOTTLE Blood  Culture adequate volume  Final   Culture   Final    NO GROWTH < 12 HOURS Performed at [facility], [place] [address], [place], [place] [address]    Report Status PENDING  Incomplete    Impression/Plan:  1. Fever - unclear source but no fever since yesterday. DDx includes line, viral illness.  On vancomycin empirically, cultures with ngtd.  Developed on ertapenem.  Did receive one dose of pip-tazo.    2.  Abdominal abscess - has been on ertapenem, anidulafungin.  CT yesterday with resolution.  Continue off of anidulafungin.  I will stop ertapenem.   Abscess did grow C albicans and glabrata previously and sensitive to fluconazole at 32.   -consider surgery/IR evaluation for JP removal  3.  Access - ok from ID standpoint to use picc line.  Will consider removing it at discharge.  Will need to be removed if blood cultures turn positive.    4. Leukocytosis - will monitor.

## 2017-10-03 DIAGNOSIS — D649 Anemia, unspecified: Secondary | ICD-10-CM

## 2017-10-03 LAB — CBC
HCT: 27.5 % — ABNORMAL LOW (ref 36.0–46.0)
Hemoglobin: 8.9 g/dL — ABNORMAL LOW (ref 12.0–15.0)
MCH: 28 pg (ref 26.0–34.0)
MCHC: 32.4 g/dL (ref 30.0–36.0)
MCV: 86.5 fL (ref 78.0–100.0)
PLATELETS: 371 10*3/uL (ref 150–400)
RBC: 3.18 MIL/uL — ABNORMAL LOW (ref 3.87–5.11)
RDW: 16 % — AB (ref 11.5–15.5)
WBC: 24.5 10*3/uL — ABNORMAL HIGH (ref 4.0–10.5)

## 2017-10-03 LAB — BASIC METABOLIC PANEL
Anion gap: 10 (ref 5–15)
BUN: 11 mg/dL (ref 6–20)
CALCIUM: 8.8 mg/dL — AB (ref 8.9–10.3)
CHLORIDE: 119 mmol/L — AB (ref 101–111)
CO2: 15 mmol/L — ABNORMAL LOW (ref 22–32)
CREATININE: 1.22 mg/dL — AB (ref 0.44–1.00)
GFR calc Af Amer: 51 mL/min — ABNORMAL LOW (ref >60)
GFR, EST NON AFRICAN AMERICAN: 44 mL/min — AB (ref >60)
Glucose, Bld: 82 mg/dL (ref 65–99)
Potassium: 3.3 mmol/L — ABNORMAL LOW (ref 3.5–5.1)
SODIUM: 144 mmol/L (ref 135–145)

## 2017-10-03 LAB — CULTURE, BLOOD (ROUTINE X 2): SPECIAL REQUESTS: ADEQUATE

## 2017-10-03 MED ORDER — ACETAMINOPHEN 325 MG PO TABS
650.0000 mg | ORAL_TABLET | Freq: Four times a day (QID) | ORAL | Status: DC | PRN
  Administered 2017-10-04 – 2017-10-05 (×2): 650 mg via ORAL
  Filled 2017-10-03 (×4): qty 2

## 2017-10-03 MED ORDER — KETOROLAC TROMETHAMINE 15 MG/ML IJ SOLN
15.0000 mg | Freq: Once | INTRAMUSCULAR | Status: AC
  Administered 2017-10-03: 15 mg via INTRAVENOUS
  Filled 2017-10-03: qty 1

## 2017-10-03 MED ORDER — HYDROCODONE-ACETAMINOPHEN 7.5-325 MG PO TABS
1.0000 | ORAL_TABLET | Freq: Four times a day (QID) | ORAL | Status: DC | PRN
  Administered 2017-10-03 – 2017-10-07 (×9): 1 via ORAL
  Filled 2017-10-03 (×11): qty 1

## 2017-10-03 MED ORDER — PROMETHAZINE HCL 25 MG PO TABS
25.0000 mg | ORAL_TABLET | Freq: Two times a day (BID) | ORAL | Status: DC | PRN
  Administered 2017-10-03 – 2017-10-05 (×5): 25 mg via ORAL
  Filled 2017-10-03 (×6): qty 1

## 2017-10-03 MED ORDER — POTASSIUM CHLORIDE CRYS ER 20 MEQ PO TBCR
40.0000 meq | EXTENDED_RELEASE_TABLET | Freq: Once | ORAL | Status: AC
  Administered 2017-10-03: 40 meq via ORAL
  Filled 2017-10-03: qty 2

## 2017-10-03 MED ORDER — HYDROMORPHONE HCL 1 MG/ML IJ SOLN
1.0000 mg | Freq: Four times a day (QID) | INTRAMUSCULAR | Status: DC | PRN
  Administered 2017-10-03: 1 mg via INTRAVENOUS
  Filled 2017-10-03: qty 1

## 2017-10-03 MED ORDER — PROMETHAZINE HCL 25 MG/ML IJ SOLN: 12.5000 mg | Freq: Four times a day (QID) | INTRAMUSCULAR | Status: DC | PRN

## 2017-10-03 NOTE — Progress Notes (Signed)
RN contacted IV team to come evaluate pt's peripheral IV due to patient complaining of pain/burning at insertion site. IV team said they weren't able to get anything and to let MD know that if they want an IV patient will have to have a PICC line or midline placed. RN contacted MD on call, MD said to consult with Infectious Disease doctor to see if patient needs a new PICC for IV antibiotics or are we still evaluating patient off of antibiotics. RN paged Infectious Disease MD and let them know of the situation that is going on with patient's IV status. So currently patient is ok to not have an IV per MD on call until we hear from Infectious Disease MD, waiting on further orders from Infectious Disease MD. Will continue to monitor patient and hold IV antibiotics until further instruction.  Carmela Hurt, RN

## 2017-10-03 NOTE — Progress Notes (Signed)
Referring Physician(s): Dr Garwin Brothers  Supervising Physician: Arne Cleveland  Patient Status:  [facility] - In-pt  Chief Complaint:  09/17/17: CT-guided percutaneous catheter drainage of pelvic abscess from a posterior right transgluteal approach.   Subjective:  pts Wbc still at 24.5 (28) Now Afeb Output 15 cc yesterday after 5-10 cc flushes OP blood tinged in bag Admitted with leukocytosis and fever Also with Rt arm swelling and pain at PICC site 10/27 [place]: Preliminary results : Positive for an acute DVT forming along the PICC line in the axillary vein.  CT 10/26 does reveal almost complete resolution of abscess  To follow OP few days; follow wbc Consider removal Mon or Tues  Allergies: Zithromax [azithromycin]; Aspirin; Ibuprofen; Iron; and Sulfa antibiotics  Medications: Prior to Admission medications   Medication Sig Start Date End Date Taking? Authorizing Provider  albuterol (PROVENTIL HFA;VENTOLIN HFA) 108 (90 Base) MCG/ACT inhaler Inhale 2 puffs into the lungs every 6 (six) hours as needed for wheezing. 12/09/15  Yes Rama, Venetia Maxon, MD  anidulafungin (ERAXIS) IVPB Inject 100 mg into the vein daily. Indication:  C glabrata fungemia & intra-abdominal abscess Last Day of Therapy:  10/10/17 Labs - Once weekly:  CBC/D and BMP, Labs - Every other week:  ESR and CRP 09/26/17 10/10/17 Yes Ralene Ok, MD  capsicum oleoresin (TRIXAICIN) 0.025 % cream Apply topically 3 (three) times daily. Patient taking differently: Apply 1 application topically 3 (three) times daily as needed (abscess).  06/15/17  Yes Nita Sells, MD  cyanocobalamin (,VITAMIN B-12,) 1000 MCG/ML injection INJECT 1000 MCG IM ONCE MONTHLY 07/09/17  Yes Rehman, Mechele Dawley, MD  dicyclomine (BENTYL) 10 MG/5ML syrup TAKE 10 MLS (20 MG TOTAL) BY MOUTH 3 (THREE) TIMES DAILY AS NEEDED. Patient taking differently: Take 20 mg by mouth 3 (three) times daily as needed (stomach cramps).  07/26/17  Yes Rehman, Mechele Dawley, MD  diphenoxylate-atropine (LOMOTIL) 2.5-0.025 MG tablet TAKE 1 TABLET BY MOUTH FOUR TIMES A DAY AS NEEDED Patient taking differently: Take one tablet twice daily. 06/28/17  Yes Setzer, Rona Ravens, NP  ertapenem (INVANZ) IVPB Inject 1 g into the vein daily. Indication:  Intra-abdominal abscess Last Day of Therapy:  10/10/17 Labs - Once weekly:  CBC/D and BMP, Labs - Every other week:  ESR and CRP 09/26/17 10/10/17 Yes Ralene Ok, MD  furosemide (LASIX) 20 MG tablet Take 1 tablet (20 mg total) by mouth daily as needed (Take for weight gain of more than 2 pounds in a day or 5 pounds in 2 days.). 06/16/17  Yes Lavina Hamman, MD  HYDROcodone-acetaminophen (NORCO) 7.5-325 MG tablet Take 1 tablet by mouth every 6 (six) hours as needed for moderate pain or severe pain. 09/26/17  Yes Michael Boston, MD  nystatin (NYSTATIN) powder Apply 1 g topically 2 (two) times daily as needed (for itching/irritation).   Yes [provider]  pantoprazole (PROTONIX) 40 MG tablet Take 1 tablet (40 mg total) by mouth daily. 06/16/17  Yes Nita Sells, MD  promethazine (PHENERGAN) 25 MG tablet TAKE 1 TABLET (25 MG TOTAL) BY MOUTH 2 (TWO) TIMES DAILY AS NEEDED FOR NAUSEA OR VOMITING. 09/29/17  Yes Rehman, Mechele Dawley, MD  Simethicone (PHAZYME PO) Take 1 tablet by mouth daily as needed (gas).    Yes [provider]  sodium bicarbonate 650 MG tablet Take 650 mg by mouth 2 (two) times daily.   Yes [provider]  spironolactone (ALDACTONE) 50 MG tablet Take 50 mg by mouth daily.  06/23/17  Yes [provider]     Vital Signs: BP 122/60 (BP Location: Left Arm)   Pulse 100   Temp 98.4 F (36.9 C) (Oral)   Resp 19   Ht 5' 5"  (1.651 m)   Wt 149 lb 14.6 oz (68 kg)   SpO2 100%   BMI 25.34 kg/m   Physical Exam  Constitutional: She is oriented to person, place, and time.  Abdominal: Soft.  Neurological: She is alert and oriented to person, place, and time.  Skin: Skin is warm  and dry.  Site of drain is clean and dry Sl tender No sign of infection OP blood tinged WBC 24.5  Nursing note and vitals reviewed.   Imaging: Ct Abdomen Pelvis W Contrast  Result Date: 10/01/2017 CLINICAL DATA:  Fever. 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 EXAM: CT ABDOMEN AND PELVIS WITH CONTRAST TECHNIQUE: Multidetector CT imaging of the abdomen and pelvis was performed using the standard protocol following bolus administration of intravenous contrast. CONTRAST:  152m ISOVUE-300 IOPAMIDOL (ISOVUE-300) INJECTION 61% COMPARISON:  09/24/2017 as well as multiple prior CT studies. FINDINGS: Lower chest: In the lower chest, there remains a moderate hiatal hernia with some residual small amount of complex fluid around the hernia. This appears improved compared to the prior study but is incompletely visualized. A mediastinal drain has been removed. There remains a moderate amount of pleural fluid at the left lung base. Given fever, there may be benefit to examining the entire chest for evidence of infection in the chest. Hepatobiliary: No focal liver abnormality is seen. Status post cholecystectomy. No biliary dilatation. Pancreas: Unremarkable. No pancreatic ductal dilatation or surrounding inflammatory changes. Spleen: Normal in size without focal abnormality. Adrenals/Urinary Tract: Adrenal glands are unremarkable. Kidneys are normal, without renal calculi, focal lesion, or hydronephrosis. Bladder is unremarkable. Stomach/Bowel: A few pelvic small bowel loops are moderately dilated and fluid-filled, likely consistent with ileus. Other small bowel loops and the colon are decompressed and there is no evidence of a significant bowel obstruction. No evidence of free intraperitoneal air or new focal  abscess. Vascular/Lymphatic: No significant vascular findings are present. No enlarged abdominal or pelvic lymph nodes. Reproductive: Uterus and bilateral adnexa are unremarkable. Other: Right transgluteal percutaneous drainage catheter remains in place. The cul de sac abscess has essentially resolved with small residual amount of fluid remaining adjacent to the drainage catheter. Musculoskeletal: No acute or significant osseous findings. IMPRESSION: 1. Less prominent complex fluid surrounding residual hiatal hernia in the lower chest. This is incompletely visualized, however and there remains a moderate left pleural effusion. Given no significant new findings in the abdomen or pelvis, there may be benefit in obtaining a CT of the chest with contrast to examine the entire chest. 2. Some degree of ileus involving distal small bowel without evidence of significant bowel obstruction. No evidence of bowel perforation or new focal abscess. 3. Nearly completely decompressed and resolved pelvic abscess at the level of the cul de sac. Only a small amount of fluid remains adjacent to the indwelling pelvic drainage catheter. Electronically Signed   By: GAletta EdouardM.D.   On: 10/01/2017 13:00   Dg Chest Port 1 View  Result Date: 10/01/2017 CLINICAL DATA:  Fever today. Recent abdominal surgery. Nausea and vomiting. EXAM: PORTABLE CHEST 1 VIEW COMPARISON:  09/14/2017 and 09/11/2017 radiographs. FINDINGS: 1223 hours. Two views obtained. The  nasogastric tube has been removed. The right arm PICC appears unchanged with the tip at the mid right atrial level. There is stable cardiomegaly. Patient has a known hiatal hernia. Left pleural effusion and left basilar opacity have increased. Otherwise, there is improved aeration of the lungs without definite residual edema. There is no pneumothorax. The bones appear unchanged. IMPRESSION: Interval resolution of pulmonary edema. Worsening left pleural effusion and left basilar  opacity, likely atelectasis. Electronically Signed   By: Richardean Sale M.D.   On: 10/01/2017 12:46    Labs:  CBC:  Recent Labs  09/24/17 0511 09/25/17 0455 10/01/17 1127 10/03/17 0454  WBC 16.3* 15.1* 28.0* 24.5*  HGB 8.1* 8.2* 9.1* 8.9*  HCT 24.8* 24.7* 27.7* 27.5*  PLT 358 455* 461* 371    COAGS:  Recent Labs  05/06/17 2133 05/07/17 0244 09/17/17 1021  INR 1.18  --  1.24  APTT  --  21*  --     BMP:  Recent Labs  09/25/17 0455 09/26/17 0410 10/01/17 1127 10/02/17 0645 10/03/17 0454  NA 139 138 141  --  144  K 2.8* 3.4* 3.6  --  3.3*  CL 115* 114* 115*  --  119*  CO2 17* 16* 13*  --  15*  GLUCOSE 109* 107* 69  --  82  BUN <5* <5* 9  --  11  CALCIUM 8.1* 8.3* 8.6*  --  8.8*  CREATININE 1.18* 1.13* 1.15* 1.21* 1.22*  GFRNONAA 46* 48* 47* 45* 44*  GFRAA 53* 56* 55* 52* 51*    LIVER FUNCTION TESTS:  Recent Labs  09/13/17 0500 09/22/17 0514 09/25/17 0455 10/01/17 1127  BILITOT 1.2 0.9 0.8 1.1  AST 19 20 16 17   ALT 14 12* 10* 9*  ALKPHOS 75 87 86 120  PROT 5.7* 6.1* 7.0 8.4*  ALBUMIN 3.2* 2.2* 2.3* 2.6*    Assessment and Plan:  Pelvic abscess drain placed in IR 10/12 CT shows resolution Wbc 24.5; now Afeb Consider removal early this week Will follow  Electronically Signed: Tifanny Dollens A, PA-C 10/03/2017, 12:57 PM   I spent a total of 15 Minutes at the the patient's bedside AND on the patient's hospital floor or unit, greater than 50% of which was counseling/coordinating care for pelvic abscess drain

## 2017-10-03 NOTE — Progress Notes (Signed)
10-03-17  1235   IV Team note;  Called to assess 24 ga iv in pt's left arm that was placed last night;  Pt c/o pain at site; iv fluids on hold;  Site bruised; sore to flush;   Attempted iv restart in rafa w another 24 ga cath; unable to thread;  Very poor veins noted even w ultrasound; pt had a picc in her R UA that was removed yesterday;   Pt has been seen/ attempted by 4 IV Team RNs since 10-01-17;  RN aware of no iv access at present and to notify MD;     Raynelle Fanning RN IV Team

## 2017-10-03 NOTE — Progress Notes (Signed)
PICC order received due to lack of access.  Dr Linus Salmons states ok to place, notified of elevated temp.  RN notified that PICC will not be placed until 10-04-17

## 2017-10-03 NOTE — Progress Notes (Signed)
Patient ID: Danielle [name], female   DOB: 1946/10/30, 71 y.o.   MRN: [id number]    PROGRESS NOTE  Danielle [name]  GBT:[id number] DOB: 11/26/1946 DOA: 10/01/2017  PCP: Iona Beard, MD   Brief Narrative:  Pt is 71 yo female who was just recently hospitalized from 10/03 until 09/26/2017 when she underwent surgery to type IV paraesophageal hiatal hernia, and required re operation due to leakage from gastrostomy tube. Postoperative course complicated by required intubation and pressor support, pt was extubated by post op day #2. She continued to improved, tolerated parenteral nutrition, however, eventually developed pelvic fluid collection and has required drain placement, on 10/06 pt found to be fungemic with blood cultures + for C. Glabrata and abscess culture + for C. Albicans and C. Glabrata.  Per d/c summary, pt was able to ambulate with PT, was improving, weaned off parenteral nutrition and tolerated diet, pain controlled. ID recommended Invanz and anidulafungin for at least two more weeks post discharge. Pt was discharged with PICC line placed in R upper arm.   She presented back [facility] ED for evaluation of intermittent fevers up to 101 F at home that she first noted on 10/24. In ED, CT scan notable for an improvement in pelvic fluid collection, Tmax 103, WBC 28 K.   Assessment & Plan: Sepsis  - developed on ertapenem - ? LLL PNA (on CXR left basilar opacity and pleural effusion noted), clinically pt with no cough and specific pulmonary concerns  - depending on clinical status, can evaluate with CT chest  - ABX per infectious disease, vancomycin and anidulafungin were ordered but no IV access  - will discuss with ID team if ok to place another PICC line vs observing off ABX - follow up on blood cultures  - CBC in AM  Abd abscess, C.  Albicans and C. Glabrata + - CT abd noting resolution - IR consulted for consideration of removing JP drain   Hypokalemia - supplement and repeat BMP in  AM  Right arm pain - at the site of PICC line - possibly occluded, will remove the line - ask for US doppler r/o blot clot   Thrombocytosis - reactive - resolved   Acute kidney injury - pre renal - continue to monitor   DVT prophylaxis: Lovenox Sq Code Status: Full  Family Communication: Patient at bedside  Disposition Plan: to be determined   Consultants:   IR  ID  Procedures:   None  Antimicrobials:   Anidulafungin - day # 21  Invanz - day #8  Zosyn 10/04 - 10/20  Subjective: Pt reports feeling better.   Objective: Vitals:   10/02/17 2022 10/03/17 0445 10/03/17 0919 10/03/17 1205  BP: 116/63 111/63  122/60  Pulse: 100 96  100  Resp: (!) 24 20  19   Temp: 98.4 F (36.9 C) 98 F (36.7 C)  98.4 F (36.9 C)  TempSrc: Oral Oral  Oral  SpO2: 100% 100%  100%  Weight:      Height:   5\' 5"  (1.651 m)     Intake/Output Summary (Last 24 hours) at 10/03/17 1528 Last data filed at 10/03/17 1400  Gross per 24 hour  Intake                0 ml  Output              164 ml  Net             -164 ml   Filed  Weights   10/02/17 0519  Weight: 68 kg (149 lb 14.6 oz)   Physical Exam  Constitutional: Appears calm, NAD CVS: RRR, S1/S2 +, no murmurs, no gallops, no carotid bruit.  Pulmonary: Effort and breath sounds normal, no stridor, rhonchi, wheezes, rales.  Abdominal: Soft. BS +,  no distension, tenderness, rebound or guarding. Open wound in upper abd, no drainage Musculoskeletal: Normal range of motion. No edema and no tenderness.   Data Reviewed: I have personally reviewed following labs and imaging studies  CBC:  Recent Labs Lab 10/01/17 1127 10/03/17 0454  WBC 28.0* 24.5*  NEUTROABS 19.1*  --   HGB 9.1* 8.9*  HCT 27.7* 27.5*  MCV 86.0 86.5  PLT 461* 188   Basic Metabolic Panel:  Recent Labs Lab 10/01/17 1127 10/02/17 0645 10/03/17 0454  NA 141  --  144  K 3.6  --  3.3*  CL 115*  --  119*  CO2 13*  --  15*  GLUCOSE 69  --  82  BUN 9  --   11  CREATININE 1.15* 1.21* 1.22*  CALCIUM 8.6*  --  8.8*   Liver Function Tests:  Recent Labs Lab 10/01/17 1127  AST 17  ALT 9*  ALKPHOS 120  BILITOT 1.1  PROT 8.4*  ALBUMIN 2.6*   CBG:  Recent Labs Lab 09/26/17 1550 09/26/17 1611 09/26/17 1639  GLUCAP 59* 68 70   Urine analysis:    Component Value Date/Time   COLORURINE YELLOW 09/23/2017 [place] 09/23/2017 0849   LABSPEC 1.008 09/23/2017 0849   PHURINE 7.0 09/23/2017 0849   GLUCOSEU NEGATIVE 09/23/2017 0849   HGBUR SMALL (A) 09/23/2017 [place] 09/23/2017 [place] 09/23/2017 0849   PROTEINUR NEGATIVE 09/23/2017 0849   UROBILINOGEN 0.2 07/29/2015 2231   NITRITE NEGATIVE 09/23/2017 0849   LEUKOCYTESUR NEGATIVE 09/23/2017 0849   Recent Results (from the past 240 hour(s))  Blood Culture (routine x 2)     Status: None (Preliminary result)   Collection Time: 10/01/17 11:27 AM  Result Value Ref Range Status   Specimen Description BLOOD RIGHT ARM  Final   Special Requests IN PEDIATRIC BOTTLE Blood Culture adequate volume  Final   Culture   Final    NO GROWTH < 24 HOURS Performed at [facility], [address], [place], [place] [address]    Report Status PENDING  Incomplete  Blood Culture (routine x 2)     Status: None (Preliminary result)   Collection Time: 10/01/17  6:55 PM  Result Value Ref Range Status   Specimen Description BLOOD LEFT HAND  Final   Special Requests IN PEDIATRIC BOTTLE Blood Culture adequate volume  Final   Culture   Final    NO GROWTH < 12 HOURS Performed at [facility], [place] [address] [place], [place] [address]    Report Status PENDING  Incomplete    Radiology Studies: No results found.  Scheduled Meds: .  morphine injection  4 mg Intravenous Once  . pantoprazole  40 mg Oral Daily  . sodium bicarbonate  650 mg Oral BID  . spironolactone  50 mg Oral Daily   Continuous Infusions: . anidulafungin    . vancomycin     . vancomycin       LOS: 1 day   Time spent: 25 minutes   Faye Ramsay, MD [facility] Pager [phone]  If 7PM-7AM, please contact night-coverage [email or web address] Password [email or web address] 10/03/2017, 3:28 PM

## 2017-10-03 NOTE — Progress Notes (Signed)
Patient running a fever of 103.0, rectal tylenol given. MD notified. Orders for PICC placed, IV team unable to place until tomorrow. IV team will try for another peripheral IV tonight. MD made aware, no further orders at this time. Will continue to monitor patient.

## 2017-10-04 ENCOUNTER — Inpatient Hospital Stay (HOSPITAL_COMMUNITY): Payer: Medicare HMO

## 2017-10-04 DIAGNOSIS — I82629 Acute embolism and thrombosis of deep veins of unspecified upper extremity: Secondary | ICD-10-CM

## 2017-10-04 LAB — CBC
HCT: 26.3 % — ABNORMAL LOW (ref 36.0–46.0)
Hemoglobin: 8.5 g/dL — ABNORMAL LOW (ref 12.0–15.0)
MCH: 27.8 pg (ref 26.0–34.0)
MCHC: 32.3 g/dL (ref 30.0–36.0)
MCV: 85.9 fL (ref 78.0–100.0)
PLATELETS: 359 10*3/uL (ref 150–400)
RBC: 3.06 MIL/uL — ABNORMAL LOW (ref 3.87–5.11)
RDW: 16 % — AB (ref 11.5–15.5)
WBC: 26.2 10*3/uL — ABNORMAL HIGH (ref 4.0–10.5)

## 2017-10-04 LAB — BASIC METABOLIC PANEL
Anion gap: 9 (ref 5–15)
BUN: 12 mg/dL (ref 6–20)
CALCIUM: 8.5 mg/dL — AB (ref 8.9–10.3)
CO2: 16 mmol/L — ABNORMAL LOW (ref 22–32)
CREATININE: 1.21 mg/dL — AB (ref 0.44–1.00)
Chloride: 121 mmol/L — ABNORMAL HIGH (ref 101–111)
GFR calc Af Amer: 52 mL/min — ABNORMAL LOW (ref >60)
GFR, EST NON AFRICAN AMERICAN: 45 mL/min — AB (ref >60)
GLUCOSE: 86 mg/dL (ref 65–99)
Potassium: 3.4 mmol/L — ABNORMAL LOW (ref 3.5–5.1)
SODIUM: 146 mmol/L — AB (ref 135–145)

## 2017-10-04 LAB — TROPONIN I

## 2017-10-04 LAB — MRSA PCR SCREENING: MRSA by PCR: NEGATIVE

## 2017-10-04 MED ORDER — LINEZOLID 600 MG PO TABS
600.0000 mg | ORAL_TABLET | Freq: Two times a day (BID) | ORAL | Status: DC
  Administered 2017-10-04 – 2017-10-06 (×4): 600 mg via ORAL
  Filled 2017-10-04 (×5): qty 1

## 2017-10-04 MED ORDER — ENOXAPARIN SODIUM 80 MG/0.8ML ~~LOC~~ SOLN
70.0000 mg | Freq: Two times a day (BID) | SUBCUTANEOUS | Status: DC
  Administered 2017-10-05 – 2017-10-07 (×5): 70 mg via SUBCUTANEOUS
  Filled 2017-10-04 (×4): qty 0.8

## 2017-10-04 MED ORDER — SODIUM CHLORIDE 0.9% FLUSH: 10.0000 mL | INTRAVENOUS | Status: DC | PRN

## 2017-10-04 MED ORDER — LINEZOLID 600 MG PO TABS: 600.0000 mg | ORAL_TABLET | Freq: Two times a day (BID) | ORAL | Status: DC

## 2017-10-04 MED ORDER — ENOXAPARIN SODIUM 80 MG/0.8ML ~~LOC~~ SOLN
70.0000 mg | Freq: Once | SUBCUTANEOUS | Status: AC
  Administered 2017-10-04: 19:00:00 70 mg via SUBCUTANEOUS
  Filled 2017-10-04: qty 0.8

## 2017-10-04 MED ORDER — MAGIC MOUTHWASH
15.0000 mL | Freq: Four times a day (QID) | ORAL | Status: DC | PRN
  Filled 2017-10-04: qty 15

## 2017-10-04 NOTE — Progress Notes (Signed)
I have received report and have taken over care from the previous nurse. The assessment has been reviewed and I am in agreement with it. Patient in stable condition.

## 2017-10-04 NOTE — Progress Notes (Signed)
Patient complained of cramping 8/10 mid chest pain. Full set of vitals and EKG completed. MD notifed. Troponin lab ordered. Will continue to monitor.

## 2017-10-04 NOTE — Progress Notes (Addendum)
[place] for Infectious Disease   Reason for visit: Follow up on fever  Interval History: fever last evening to 103;  Having some burning chest pain that feels like acid reflux; WBC up to 26.  No chills.  Some abdominal discomfort as well.  IR has removed drains. Antibiotic days 24 linezolid day 1 anidulafungin 21 days invanz 8 days Pip-tazo 10/4 - 10/20   Physical Exam: Constitutional:  Vitals:   10/04/17 1252 10/04/17 1638  BP:  (!) 113/53  Pulse:  91  Resp:  (!) 26  Temp: 98 F (36.7 C) 98 F (36.7 C)  SpO2:  100%   patient appears in NAD Eyes: anicteric Respiratory: Normal respiratory effort; CTA B Cardiovascular: RRR GI: soft, nt, nd Skin: no rashes  Review of Systems: Constitutional: negative for chills Respiratory: negative for cough, sputum or dyspnea on exertion Cardiovascular: negative for chest pressure/discomfort, palpitations Gastrointestinal: positive for nausea Integument/breast: negative for rash  Lab Results  Component Value Date   WBC 26.2 (H) 10/04/2017   HGB 8.5 (L) 10/04/2017   HCT 26.3 (L) 10/04/2017   MCV 85.9 10/04/2017   PLT 359 10/04/2017    Lab Results  Component Value Date   CREATININE 1.21 (H) 10/04/2017   BUN 12 10/04/2017   NA 146 (H) 10/04/2017   K 3.4 (L) 10/04/2017   CL 121 (H) 10/04/2017   CO2 16 (L) 10/04/2017    Lab Results  Component Value Date   ALT 9 (L) 10/01/2017   AST 17 10/01/2017   ALKPHOS 120 10/01/2017     Microbiology: Recent Results (from the past 240 hour(s))  Blood Culture (routine x 2)     Status: None (Preliminary result)   Collection Time: 10/01/17 11:27 AM  Result Value Ref Range Status   Specimen Description BLOOD RIGHT ARM  Final   Special Requests IN PEDIATRIC BOTTLE Blood Culture adequate volume  Final   Culture   Final    NO GROWTH 3 DAYS Performed at [facility], [address] [place], [place] [address]    Report Status PENDING  Incomplete  Blood Culture (routine  x 2)     Status: None (Preliminary result)   Collection Time: 10/01/17  6:55 PM  Result Value Ref Range Status   Specimen Description BLOOD LEFT HAND  Final   Special Requests IN PEDIATRIC BOTTLE Blood Culture adequate volume  Final   Culture   Final    NO GROWTH 3 DAYS Performed at [facility], [place] [address], [place], [place] [address]    Report Status PENDING  Incomplete  MRSA PCR Screening     Status: None   Collection Time: 10/04/17 11:03 AM  Result Value Ref Range Status   MRSA by PCR NEGATIVE NEGATIVE Final    Comment:        The GeneXpert MRSA Assay (FDA approved for NASAL specimens only), is one component of a comprehensive MRSA colonization surveillance program. It is not intended to diagnose MRSA infection nor to guide or monitor treatment for MRSA infections.     Impression/Plan:  1. Fever - suspected source of acute thrombus of axillary vein from previous picc line.  No other source, negative blood cultures to date, abdomina abscess resolved.  C diff negative. I will start linezolid  2.  Abdominal abscess - resolved and JPs removed today.  Off of antibiotics.    3.  Access - had to remove picc line. Unable to get peripheral access.  Attempted picc line this am but unable to place.   At this time, from an ID standpoint, I think it is reasonable to use oral antibiotics as above.    4. Leukocytosis - some increase yesterday.  Will monitor.    Dr. Johnnye Sima back tomorrow

## 2017-10-04 NOTE — Progress Notes (Addendum)
Patient ID: Danielle [name], female   DOB: [birth year], 71 y.o.   MRN: [id number]    PROGRESS NOTE  Danielle [name]  FBP:[id number] DOB: Dec 30, 1945 DOA: 10/01/2017  PCP: Iona Beard, MD   Brief Narrative:  Pt is 71 yo female who was just recently hospitalized from 10/03 until 09/26/2017 when she underwent surgery to type IV paraesophageal hiatal hernia, and required re operation due to leakage from gastrostomy tube. Postoperative course complicated by required intubation and pressor support, pt was extubated by post op day #2. She continued to improved, tolerated parenteral nutrition, however, eventually developed pelvic fluid collection and has required drain placement, on 10/06 pt found to be fungemic with blood cultures + for C. Glabrata and abscess culture + for C. Albicans and C. Glabrata.  Per d/c summary, pt was able to ambulate with PT, was improving, weaned off parenteral nutrition and tolerated diet, pain controlled. ID recommended Invanz and anidulafungin for at least two more weeks post discharge. Pt was discharged with PICC line placed in R upper arm.   She presented back [facility] ED for evaluation of intermittent fevers up to 101 F at home that she first noted on 10/24. In ED, CT scan notable for an improvement in pelvic fluid collection, Tmax 103, WBC 28 K.   Assessment & Plan: Sepsis  - developed on ertapenem - ? LLL PNA (on CXR left basilar opacity and pleural effusion noted), clinically pt with no cough and specific pulmonary concerns  - depending on clinical status, can evaluate with CT chest  - ABX per infectious disease, vancomycin and anidulafungin were ordered but no IV access  - attempted to place PICC Line but not successful, will likely have to be done by IR under US guidance  - d/w Dr. Linus Salmons, pt clinically stable, we can try poral Linezolid and observe - if persistent fevers, leukocytosis, can consider placing PICC line and further FUO work up  Right arm DVT, axillary  vein - in arm that had PICC line  - PICC line from right arm removed - place on [facility], pharmacy consulted   Abd abscess, C.  Albicans and C. Glabrata + - CT abd noting resolution - IR consulted for consideration of removing JP drain   Hypokalemia - still low, supplement and repeat BMP in AM  Thrombocytosis - reactive - resolved   Acute kidney injury, hypernatremia  - pre renal - encouraged oral intake  - overall stable, BMP In AM  DVT prophylaxis: Lovenox  Code Status: Full  Family Communication: Patient and daughter at bedside  Disposition Plan: to be determined   Consultants:   IR  ID  Procedures:   None  Antimicrobials:   Anidulafungin - day # 21  Invanz - day #8  Zosyn 10/04 - 10/20  Subjective: Pt reports feeling better.   Objective: Vitals:   10/03/17 1700 10/03/17 1909 10/03/17 2024 10/04/17 0640  BP: 124/60  116/66 111/65  Pulse: (!) 107  100 94  Resp: (!) 32  (!) 24 20  Temp: (!) 103 F (39.4 C) (!) 101.2 F (38.4 C) 98.3 F (36.8 C) 98.3 F (36.8 C)  TempSrc: Oral Oral Oral Oral  SpO2: 100%  100% 100%  Weight:      Height:        Intake/Output Summary (Last 24 hours) at 10/04/17 0848 Last data filed at 10/03/17 1846  Gross per 24 hour  Intake  0 ml  Output                0 ml  Net                0 ml   Filed Weights   10/02/17 0519  Weight: 68 kg (149 lb 14.6 oz)   Physical Exam  Constitutional: Appears well-developed and well-nourished. No distress.  CVS: RRR, S1/S2 +, no murmurs, no gallops, no carotid bruit.  Pulmonary: Effort and breath sounds normal, no stridor,diminished breath sounds at bases  Abdominal: Soft. BS +,  no distension, tenderness, open abd wound, no drainage   Data Reviewed: I have personally reviewed following labs and imaging studies  CBC:  Recent Labs Lab 10/01/17 1127 10/03/17 0454 10/04/17 0455  WBC 28.0* 24.5* 26.2*  NEUTROABS 19.1*  --   --   HGB 9.1* 8.9* 8.5*  HCT 27.7* 27.5*  26.3*  MCV 86.0 86.5 85.9  PLT 461* 371 147   Basic Metabolic Panel:  Recent Labs Lab 10/01/17 1127 10/02/17 0645 10/03/17 0454 10/04/17 0455  NA 141  --  144 146*  K 3.6  --  3.3* 3.4*  CL 115*  --  119* 121*  CO2 13*  --  15* 16*  GLUCOSE 69  --  82 86  BUN 9  --  11 12  CREATININE 1.15* 1.21* 1.22* 1.21*  CALCIUM 8.6*  --  8.8* 8.5*   Liver Function Tests:  Recent Labs Lab 10/01/17 1127  AST 17  ALT 9*  ALKPHOS 120  BILITOT 1.1  PROT 8.4*  ALBUMIN 2.6*   Urine analysis:    Component Value Date/Time   COLORURINE YELLOW 09/23/2017 [place] 09/23/2017 0849   LABSPEC 1.008 09/23/2017 0849   PHURINE 7.0 09/23/2017 [place] 09/23/2017 0849   HGBUR SMALL (A) 09/23/2017 0849   BILIRUBINUR NEGATIVE 09/23/2017 [place] 09/23/2017 0849   PROTEINUR NEGATIVE 09/23/2017 0849   UROBILINOGEN 0.2 07/29/2015 2231   NITRITE NEGATIVE 09/23/2017 0849   LEUKOCYTESUR NEGATIVE 09/23/2017 0849   Recent Results (from the past 240 hour(s))  Blood Culture (routine x 2)     Status: None (Preliminary result)   Collection Time: 10/01/17 11:27 AM  Result Value Ref Range Status   Specimen Description BLOOD RIGHT ARM  Final   Special Requests IN PEDIATRIC BOTTLE Blood Culture adequate volume  Final   Culture   Final    NO GROWTH 2 DAYS Performed at [facility], [place] [address], [place], [place] [address]    Report Status PENDING  Incomplete  Blood Culture (routine x 2)     Status: None (Preliminary result)   Collection Time: 10/01/17  6:55 PM  Result Value Ref Range Status   Specimen Description BLOOD LEFT HAND  Final   Special Requests IN PEDIATRIC BOTTLE Blood Culture adequate volume  Final   Culture   Final    NO GROWTH 2 DAYS Performed at [facility], [place] [address], [place], [place] [address]    Report Status PENDING  Incomplete    Radiology Studies: No results found.  Scheduled Meds: .  morphine injection   4 mg Intravenous Once  . pantoprazole  40 mg Oral Daily  . sodium bicarbonate  650 mg Oral BID  . spironolactone  50 mg Oral Daily   Continuous Infusions: . anidulafungin    . vancomycin    . vancomycin  LOS: 2 days   Time spent: 25 minutes   Faye Ramsay, MD [facility] Pager [phone]  If 7PM-7AM, please contact night-coverage [email or web address] Password [email or web address] 10/04/2017, 8:48 AM

## 2017-10-04 NOTE — Progress Notes (Signed)
ANTICOAGULATION CONSULT NOTE - Initial Consult  Pharmacy Consult for enoxaparin Indication: DVT  Allergies  Allergen Reactions  . Zithromax [Azithromycin] Swelling and Other (See Comments)    Mouth swelling and blisters   . Aspirin Other (See Comments)    Stomach cramping   . Ibuprofen Other (See Comments)    Pt states that she prefers not to take this medication.   . Iron Nausea And Vomiting and Other (See Comments)    FERROUS SULFATE  . Sulfa Antibiotics Nausea And Vomiting    Patient Measurements: Height: 5\' 5"  (165.1 cm) Weight: 149 lb 14.6 oz (68 kg) IBW/kg (Calculated) : 57 Heparin Dosing Weight:   Vital Signs: Temp: 98 F (36.7 C) (10/29 1638) Temp Source: Oral (10/29 1638) BP: 113/53 (10/29 1638) Pulse Rate: 91 (10/29 1638)  Labs:  Recent Labs  10/02/17 0645 10/03/17 0454 10/04/17 0455  HGB  --  8.9* 8.5*  HCT  --  27.5* 26.3*  PLT  --  371 359  CREATININE 1.21* 1.22* 1.21*    Estimated Creatinine Clearance: 39.5 mL/min (A) (by C-G formula based on SCr of 1.21 mg/dL (H)).   Medical History: Past Medical History:  Diagnosis Date  . Crohn disease ([place])   . Hiatal hernia 12/07/2015  . Left buttock abscess 04/21/2017  . Psoriasis   . SBO (small bowel obstruction) ([place]) 07/27/2017    Assessment: 9 YOF found to have a DVT in RUE (site of PICC).  Pharmacy asked to dose enoxaparin.    Today, 10/04/2017  Renal: mild increase in SCr but CrCl > 66ml/min CBC: reveals anemia (Hgb stable), pltc WNL IR removed drain at bedside this afternoon  Goal of Therapy:  Anti-Xa level 0.6-1 units/ml 4hrs after LMWH dose given Monitor platelets by anticoagulation protocol: Yes   Plan:   Enoxaparin 70mg  SQ q12h while in hospital  Consider change to 1.5mg /kg (100mg ) SQ q24h prior to discharge  Monitor CBC at least q72h while in hospital and on LMWH  Monitor Renal fx  Monitor for bleeding  Doreene Eland, PharmD, BCPS.   Pager: [phone] 10/04/2017 5:23  PM

## 2017-10-04 NOTE — Progress Notes (Signed)
Chief Complaint: Follow up pelvic abscess drain  Supervising Physician: Sandi Mariscal  Patient Status: [facility] - In-pt  History of Present Illness: Danielle [name] is a 71 y.o. female with hx of pelvic fluid collection s/p perc transgluteal drain placed 10/13. She has had interval CT scans showing improvement but has now been admitted with other acute issues. IR is asked to eval her drain. CT abd/pelvic done 10/28 to reviewed. Pt reports scant serous fluid output from drain.  Past Medical History:  Diagnosis Date  . Crohn disease ([place])   . Hiatal hernia 12/07/2015  . Left buttock abscess 04/21/2017  . Psoriasis   . SBO (small bowel obstruction) ([place]) 07/27/2017    Past Surgical History:  Procedure Laterality Date  . ANAL STENOSIS  ~ 2010  . APPENDECTOMY  1980's  . BOWEL RESECTION  2009  . CHOLECYSTECTOMY  1990's  . COLONOSCOPY  2013  . COLONOSCOPY WITH PROPOFOL N/A 03/12/2017   Procedure: COLONOSCOPY WITH PROPOFOL;  Surgeon: Rogene Houston, MD;  [facility]: [facility];  Service: Endoscopy;  Laterality: N/A;  2:05-moved up to 1025 per Lelon Frohlich  . COLOSTOMY  2009   DEMASO  . COLOSTOMY TAKEDOWN  2009   "only had it for about 1 month" (08/22/2013)  . ESOPHAGEAL DILATION N/A 03/12/2017   Procedure: ESOPHAGEAL DILATION;  Surgeon: Rogene Houston, MD;  [facility]: [facility];  Service: Endoscopy;  Laterality: N/A;  . ESOPHAGOGASTRODUODENOSCOPY  11/11/2011   Procedure: ESOPHAGOGASTRODUODENOSCOPY (EGD);  Surgeon: Missy Sabins, MD;  Location: [facility] ENDOSCOPY;  Service: Endoscopy;  Laterality: N/A;  . ESOPHAGOGASTRODUODENOSCOPY  05/18/2012   Procedure: ESOPHAGOGASTRODUODENOSCOPY (EGD);  Surgeon: Rogene Houston, MD;  [facility];  Service: Endoscopy;  Laterality: N/A;  200  . ESOPHAGOGASTRODUODENOSCOPY (EGD) WITH PROPOFOL N/A 03/12/2017   Procedure: ESOPHAGOGASTRODUODENOSCOPY (EGD) WITH PROPOFOL;  Surgeon: Rogene Houston, MD;  [facility]: [facility];  Service: Endoscopy;  Laterality:  N/A;  . FLEXIBLE SIGMOIDOSCOPY  11/11/2011   Procedure: FLEXIBLE SIGMOIDOSCOPY;  Surgeon: Missy Sabins, MD;  Location: [place];  Service: Endoscopy;  Laterality: N/A;  . goiter  1999  . HIATAL HERNIA REPAIR N/A 09/08/2017   Procedure: DIAGNOSTIC LAPAROSCOPIC PLACEMENT OF gastrostomy TUBE AND GASTROPEXY;  Surgeon: Ralene Ok, MD;  Location: [facility];  Service: General;  Laterality: N/A;  . INCISION AND DRAINAGE PERIRECTAL ABSCESS N/A 04/21/2017   Procedure: IRRIGATION AND DEBRIDEMENT PERIRECTAL ABSCESS;  Surgeon: Coralie Keens, MD;  Location: [place];  Service: General;  Laterality: N/A;  . LAPAROSCOPIC LYSIS OF ADHESIONS N/A 09/08/2017   Procedure: LAPAROSCOPIC LYSIS OF ADHESIONS;  Surgeon: Ralene Ok, MD;  Location: [facility];  Service: General;  Laterality: N/A;  . LAPAROTOMY N/A 09/10/2017   Procedure: Exploratory laparotomy with takedown of G tube, repair gastrostomy abdominal washout;  Surgeon: Clovis Riley, MD;  Location: [facility];  Service: General;  Laterality: N/A;  . LEFT HEART CATHETERIZATION WITH CORONARY ANGIOGRAM N/A 07/09/2014   Procedure: LEFT HEART CATHETERIZATION WITH CORONARY ANGIOGRAM;  Surgeon: Peter M Martinique, MD;  Location: [facility] CATH LAB;  Service: Cardiovascular;  Laterality: N/A;  . PROCTOSCOPY N/A 01/31/2015   Procedure: RIGID PROCTOSCOPY;  Surgeon: Leighton Ruff, MD;  Location: [place];  Service: General;  Laterality: N/A;  . RECTAL EXAM UNDER ANESTHESIA N/A 01/31/2015   Procedure: RECTAL EXAM UNDER ANESTHESIA WITH PLACEMENT OF A SETON;  Surgeon: Leighton Ruff, MD;  Location: [place];  Service: General;  Laterality: N/A;    Allergies: Zithromax [azithromycin]; Aspirin; Ibuprofen; Iron;  and Sulfa antibiotics  Medications: Prior to Admission medications   Medication Sig Start Date End Date Taking? Authorizing Provider  albuterol (PROVENTIL HFA;VENTOLIN HFA) 108 (90 Base) MCG/ACT inhaler Inhale 2 puffs into the lungs every 6 (six) hours as needed for wheezing.  12/09/15  Yes Rama, Venetia Maxon, MD  anidulafungin (ERAXIS) IVPB Inject 100 mg into the vein daily. Indication:  C glabrata fungemia & intra-abdominal abscess Last Day of Therapy:  10/10/17 Labs - Once weekly:  CBC/D and BMP, Labs - Every other week:  ESR and CRP 09/26/17 10/10/17 Yes Ralene Ok, MD  capsicum oleoresin (TRIXAICIN) 0.025 % cream Apply topically 3 (three) times daily. Patient taking differently: Apply 1 application topically 3 (three) times daily as needed (abscess).  06/15/17  Yes Nita Sells, MD  cyanocobalamin (,VITAMIN B-12,) 1000 MCG/ML injection INJECT 1000 MCG IM ONCE MONTHLY 07/09/17  Yes Rehman, Mechele Dawley, MD  dicyclomine (BENTYL) 10 MG/5ML syrup TAKE 10 MLS (20 MG TOTAL) BY MOUTH 3 (THREE) TIMES DAILY AS NEEDED. Patient taking differently: Take 20 mg by mouth 3 (three) times daily as needed (stomach cramps).  07/26/17  Yes Rehman, Mechele Dawley, MD  diphenoxylate-atropine (LOMOTIL) 2.5-0.025 MG tablet TAKE 1 TABLET BY MOUTH FOUR TIMES A DAY AS NEEDED Patient taking differently: Take one tablet twice daily. 06/28/17  Yes Setzer, Rona Ravens, NP  ertapenem (INVANZ) IVPB Inject 1 g into the vein daily. Indication:  Intra-abdominal abscess Last Day of Therapy:  10/10/17 Labs - Once weekly:  CBC/D and BMP, Labs - Every other week:  ESR and CRP 09/26/17 10/10/17 Yes Ralene Ok, MD  furosemide (LASIX) 20 MG tablet Take 1 tablet (20 mg total) by mouth daily as needed (Take for weight gain of more than 2 pounds in a day or 5 pounds in 2 days.). 06/16/17  Yes Lavina Hamman, MD  HYDROcodone-acetaminophen (NORCO) 7.5-325 MG tablet Take 1 tablet by mouth every 6 (six) hours as needed for moderate pain or severe pain. 09/26/17  Yes Michael Boston, MD  nystatin (NYSTATIN) powder Apply 1 g topically 2 (two) times daily as needed (for itching/irritation).   Yes [provider]  pantoprazole (PROTONIX) 40 MG tablet Take 1 tablet (40 mg total) by mouth daily. 06/16/17  Yes Nita Sells, MD  promethazine (PHENERGAN) 25 MG tablet TAKE 1 TABLET (25 MG TOTAL) BY MOUTH 2 (TWO) TIMES DAILY AS NEEDED FOR NAUSEA OR VOMITING. 09/29/17  Yes Rehman, Mechele Dawley, MD  Simethicone (PHAZYME PO) Take 1 tablet by mouth daily as needed (gas).    Yes [provider]  sodium bicarbonate 650 MG tablet Take 650 mg by mouth 2 (two) times daily.   Yes [provider]  spironolactone (ALDACTONE) 50 MG tablet Take 50 mg by mouth daily.  06/23/17  Yes [provider]     Family History  Problem Relation Age of Onset  . Diabetes Mother   . Heart disease Father   . Healthy Sister   . Hypertension Brother   . Colon cancer Brother        died with colon cancer  . Crohn's disease Paternal Aunt     Social History   Social History  . Marital status: Single    Spouse name: N/A  . Number of children: N/A  . Years of education: N/A   Social History Main Topics  . Smoking status: Former Smoker    Packs/day: 0.12    Years: 4.00    Types: Cigarettes    Quit date:  10/05/1996  . Smokeless tobacco: Never Used     Comment: 08/22/2013 1 pack every two weeks when she did smoked  . Alcohol use No  . Drug use: No  . Sexual activity: Not Currently    Birth control/ protection: Post-menopausal, Implant   Other Topics Concern  . None   Social History Narrative   Lives in [place].  Moved from [place] in 2008.  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  LIves with her sister.   No home health services.        Review of Systems: A 12 point ROS discussed and pertinent positives are indicated in the HPI above.  All other systems are negative.  Review of Systems  Vital Signs: BP 111/65 (BP Location: Left Arm)   Pulse 94   Temp 98.3 F (36.8 C) (Oral)   Resp 20   Ht _0  (1.651 m)   Wt 149 lb 14.6 oz (68 kg)   SpO2 100%   BMI 25.34 kg/m   Physical Exam  Constitutional: She is oriented to person, place, and time. She appears well-developed. No distress.  Pulmonary/Chest:  Effort normal and breath sounds normal. No respiratory distress.  Abdominal: Soft. There is no tenderness.  (R)transgluteal drain intact, site clean Output serous, <10 mL  Neurological: She is alert and oriented to person, place, and time.    Imaging: Ct Angio Chest Pe W Or Wo Contrast  Result Date: 09/10/2017 CLINICAL DATA:  Shortness of breath with severe abdominal pain. Recent paraesophageal hernia repair. EXAM: CT ANGIOGRAPHY CHEST CT ABDOMEN AND PELVIS WITH CONTRAST TECHNIQUE: Multidetector CT imaging of the chest was performed using the standard protocol during bolus administration of intravenous contrast. Multiplanar CT image reconstructions and MIPs were obtained to evaluate the vascular anatomy. Multidetector CT imaging of the abdomen and pelvis was performed using the standard protocol during bolus administration of intravenous contrast. CONTRAST:  80 mL Isovue 370 IV. COMPARISON:  CT chest 05/08/2017 and CT abdomen/ pelvis 06/14/2017 FINDINGS: CTA CHEST FINDINGS Cardiovascular: Heart is normal size an compressed anteriorly by the large hiatal hernia and postoperative air and fluid over the mediastinum adjacent the distal esophagus and hiatal hernia. Minimal calcified plaque over the left anterior descending coronary artery. Thoracic aorta is within normal. No definite pulmonary emboli. Mediastinum/Nodes: No mediastinal or hilar adenopathy. There is a large collection over the mid to lower mediastinum including patient's large hiatal hernia containing contrast as well as a segment of catheter tubing likely extending up from below from patient's percutaneous gastrostomy tube. There is moderate fluid and air around the hiatal hernia and esophagus over the mid to lower mediastinum as the prior exam demonstrated multiple additional bowel loops in this region. It is difficult to determine how much of this air-fluid is free air and fluid versus within bowel loops. No evidence of contrast extravasation.  Lungs/Pleura: Examination demonstrates mild bilateral atelectatic change and small effusions. Review of the MIP images confirms the above findings. CT ABDOMEN and PELVIS FINDINGS Hepatobiliary: Previous cholecystectomy.  Liver is unremarkable. Pancreas: Pancreas is within normal. Spleen: Spleen is unremarkable. Adrenals/Urinary Tract: Adrenal glands and kidneys are within normal. Ureters and bladder are unremarkable. Stomach/Bowel: As described above as there is a persistent large hiatal hernia likely with additional bowel loops within the mid to lower mediastinum. Cannot exclude free fluid and free air in the mid to lower mediastinum. No contrast extravasation. Percutaneous gastrostomy tube over the left upper quadrant appears in adequate position. Moderate free peritoneal air over  the upper abdomen compatible recent surgery and gastrostomy tube placement. Patient had a previous partial colectomy. There are surgical clips over several small bowel loops. There are no definite dilated small bowel loops. Visualize colon is unremarkable. There is mild ascites likely from patient's recent surgery. Vascular/Lymphatic: Within normal. Reproductive: Within normal. Other: Postsurgical change over the anterior abdominal wall. Musculoskeletal: Degenerative change of the spine and hips. Stable L1 mild compression fracture. Tarlov cyst. Review of the MIP images confirms the above findings. IMPRESSION: No evidence of pulmonary embolism. Small bilateral pleural effusions with bilateral atelectasis. Persistent large hiatal hernia with large fluid and air collection within the mid to lower mediastinum some of which may be within bowel loops, although it is difficult to assess how much of this air and fluid is free and related to patient's surgery as well as gastrostomy tube placement. There is contrast within the esophagus and hiatal hernia without contrast extravasation. Gastrostomy tube in adequate position. Postsurgical changes  over the upper abdomen with free air and ascites. No evidence of bowel obstruction. Evidence of previous partial colectomy. Stable L1 compression fracture. Electronically Signed   By: Marin Olp M.D.   On: 09/10/2017 00:34   Ct Abdomen Pelvis W Contrast  Result Date: 10/01/2017 CLINICAL DATA:  Fever. 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 EXAM: CT ABDOMEN AND PELVIS WITH CONTRAST TECHNIQUE: Multidetector CT imaging of the abdomen and pelvis was performed using the standard protocol following bolus administration of intravenous contrast. CONTRAST:  144m ISOVUE-300 IOPAMIDOL (ISOVUE-300) INJECTION 61% COMPARISON:  09/24/2017 as well as multiple prior CT studies. FINDINGS: Lower chest: In the lower chest, there remains a moderate hiatal hernia with some residual small amount of complex fluid around the hernia. This appears improved compared to the prior study but is incompletely visualized. A mediastinal drain has been removed. There remains a moderate amount of pleural fluid at the left lung base. Given fever, there may be benefit to examining the entire chest for evidence of infection in the chest. Hepatobiliary: No focal liver abnormality is seen. Status post cholecystectomy. No biliary dilatation. Pancreas: Unremarkable. No pancreatic ductal dilatation or surrounding inflammatory changes. Spleen: Normal in size without focal abnormality. Adrenals/Urinary Tract: Adrenal glands are unremarkable. Kidneys are normal, without renal calculi, focal lesion, or hydronephrosis. Bladder is unremarkable. Stomach/Bowel: A few pelvic small bowel loops are moderately dilated and fluid-filled, likely consistent with ileus. Other small bowel loops and the colon are decompressed and there is no evidence of a  significant bowel obstruction. No evidence of free intraperitoneal air or new focal abscess. Vascular/Lymphatic: No significant vascular findings are present. No enlarged abdominal or pelvic lymph nodes. Reproductive: Uterus and bilateral adnexa are unremarkable. Other: Right transgluteal percutaneous drainage catheter remains in place. The cul de sac abscess has essentially resolved with small residual amount of fluid remaining adjacent to the drainage catheter. Musculoskeletal: No acute or significant osseous findings. IMPRESSION: 1. Less prominent complex fluid surrounding residual hiatal hernia in the lower chest. This is incompletely visualized, however and there remains a moderate left pleural effusion. Given no significant new findings in the abdomen or pelvis, there may be benefit in obtaining a CT of the chest with contrast to examine the entire chest. 2. Some degree of ileus involving distal small bowel without evidence of significant bowel obstruction. No evidence of bowel perforation or new focal abscess.  3. Nearly completely decompressed and resolved pelvic abscess at the level of the cul de sac. Only a small amount of fluid remains adjacent to the indwelling pelvic drainage catheter. Electronically Signed   By: Aletta Edouard M.D.   On: 10/01/2017 13:00   Ct Abdomen Pelvis W Contrast  Result Date: 09/24/2017 CLINICAL DATA:  Status post pelvic abscess drainage. EXAM: CT ABDOMEN AND PELVIS WITH CONTRAST TECHNIQUE: Multidetector CT imaging of the abdomen and pelvis was performed using the standard protocol following bolus administration of intravenous contrast. CONTRAST:  113m ISOVUE-300 IOPAMIDOL (ISOVUE-300) INJECTION 61% COMPARISON:  CT scan of September 16, 2017. FINDINGS: Lower chest: Moderate bilateral pleural effusions are noted with adjacent atelectasis. There is again noted large hiatal hernia. Crescent shaped fluid collection is again noted around the hernia. One surgically placed drainage  catheter has been removed from this area. The remaining surgically placed drainage catheter is unchanged in position, but the fluid is distal to the distal end of the catheter. Hepatobiliary: Status post cholecystectomy. 3.1 x 2.9 cm fluid collection remains inferior to falciform ligament. No focal abnormality is seen within the liver. Pancreas: Unremarkable. No pancreatic ductal dilatation or surrounding inflammatory changes. Spleen: Normal in size without focal abnormality. Adrenals/Urinary Tract: Adrenal glands are unremarkable. Kidneys are normal, without renal calculi, focal lesion, or hydronephrosis. Bladder is unremarkable. Stomach/Bowel: There is no evidence of bowel obstruction. Mild wall thickening of cecum and transverse colon is noted which may represent mild inflammation. Status post appendectomy. Vascular/Lymphatic: No significant vascular findings are present. No enlarged abdominal or pelvic lymph nodes. Reproductive: Uterus and bilateral adnexa are unremarkable. Other: There is been interval placement of right trans gluteal percutaneous drainage catheter. Fluid collection noted in pre rectal space on prior exam is significantly smaller. Only a small amount of residual fluid remains, measuring 6.3 x 1.8 cm. Musculoskeletal: No acute or significant osseous findings. IMPRESSION: Moderate bilateral pleural effusions are noted with adjacent atelectasis. Large hiatal hernia is again noted. Crescent-shaped fluid collection is again noted around the hernia which is not significantly changed. One of the surgically placed drains seen within this has been removed in the interval. The remaining drain does not appear to be positioned within the fluid collection. 3 cm fluid collection is seen inferior to falciform ligament which is slightly smaller compared to prior exam. Interval placement of right trans gluteal percutaneous drainage catheter into pre rectal abscess or fluid collection. This fluid collection is  significantly smaller compared to prior exam, with only a small amount of residual fluid remaining. Electronically Signed   By: JMarijo Conception M.D.   On: 09/24/2017 14:50   Ct Abdomen Pelvis W Contrast  Result Date: 09/16/2017 CLINICAL DATA:  admitted for surgical intervention of hiatal hernia. She had laparoscopic gastropexy, lysis of adhesions, and G tube placement. Developed septic shock and VDRF post op from pneumoperitoneum with purulent peritonitis with concern for G tube leak.HX crohns, hiatal hernia, bowel resection, appendectomy, cholecystectomy EXAM: CT ABDOMEN AND PELVIS WITH CONTRAST TECHNIQUE: Multidetector CT imaging of the abdomen and pelvis was performed using the standard protocol following bolus administration of intravenous contrast. CONTRAST:  856mISOVUE-300 IOPAMIDOL (ISOVUE-300) INJECTION 61% COMPARISON:  09/09/2017 FINDINGS: Lower chest: Moderate bilateral pleural effusions. Extensive atelectasis/ consolidation in the dependent aspect of both lower lobes. Right subclavian central line to the cavoatrial junction. Large hiatal hernia containing gastric fundus and a large amount of mesenteric fat, with surrounding fluid which may be ascites or hemorrhage. Nasogastric tube in place. Two surgical drains  from the abdomen extend into the hiatal hernia region. Hepatobiliary: Liver unremarkable. Cholecystectomy clips. 4.4 x2.5 cm fluid collection at inferior aspect of the falciform ligament, without surrounding enhancement. No intraparenchymal liver lesion. No biliary ductal dilatation. Pancreas: Unremarkable. No pancreatic ductal dilatation or surrounding inflammatory changes. Spleen: Normal in size without focal abnormality. Adrenals/Urinary Tract: Normal adrenals. Kidneys unremarkable. Foley catheter decompresses the urinary bladder. Stomach/Bowel: Interval removal of gastrostomy tube. Stomach is decompressed, the fundus extending into the hiatal hernia as above. Proximal small bowel  decompressed. There is some distended fluid-filled loops of distal small bowel long-term including terminal ileum showing circumferential mild wall thickening. There is contiguous circumferential wall thickening in the proximal ascending colon, also segmentally in the splenic flexure of the colon. There is moderate inflammatory/edematous change around the splenic flexure without abscess. Colon is nondilated. Rectal tube in place. Vascular/Lymphatic: No significant vascular findings are present. No enlarged abdominal or pelvic lymph nodes. Reproductive: Status post hysterectomy. No adnexal masses. Other: Loculated peripherally enhancing fluid collection in the cul-de-sac measured approximately 8.7 x 2.1 cm maximum transverse dimensions. There is scattered fluid among leaves of the mesentery. No free air. Musculoskeletal: Stable mild L1 compression deformity. No acute fracture or worrisome bone lesion. 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 IMPRESSION: 1. Complex postop fluid collection in the large hiatal hernia may represent ascites and edematous mesentery versus hemorrhage. 2. 8.7 cm peripherally enhancing cul-de-sac fluid collection, possible abscess. 3. Circumferential wall thickening in distal ileum, proximal ascending colon, and splenic flexure with surrounding inflammatory/edematous changes consistent with colitis. 4. Increase in bilateral pleural effusions. Electronically Signed   By: Lucrezia Europe M.D.   On: 09/16/2017 19:36   Ct Abdomen Pelvis W Contrast  Result Date: 09/10/2017 CLINICAL DATA:  Shortness of breath with severe abdominal pain. Recent paraesophageal hernia repair. EXAM: CT ANGIOGRAPHY CHEST CT ABDOMEN AND PELVIS WITH CONTRAST TECHNIQUE: Multidetector CT imaging of the chest was performed using the standard protocol during bolus administration of intravenous contrast. Multiplanar CT image reconstructions and MIPs were obtained to evaluate the vascular anatomy. Multidetector CT imaging  of the abdomen and pelvis was performed using the standard protocol during bolus administration of intravenous contrast. CONTRAST:  80 mL Isovue 370 IV. COMPARISON:  CT chest 05/08/2017 and CT abdomen/ pelvis 06/14/2017 FINDINGS: CTA CHEST FINDINGS Cardiovascular: Heart is normal size an compressed anteriorly by the large hiatal hernia and postoperative air and fluid over the mediastinum adjacent the distal esophagus and hiatal hernia. Minimal calcified plaque over the left anterior descending coronary artery. Thoracic aorta is within normal. No definite pulmonary emboli. Mediastinum/Nodes: No mediastinal or hilar adenopathy. There is a large collection over the mid to lower mediastinum including patient's large hiatal hernia containing contrast as well as a segment of catheter tubing likely extending up from below from patient's percutaneous gastrostomy tube. There is moderate fluid and air around the hiatal hernia and esophagus over the mid to lower mediastinum as the prior exam demonstrated multiple additional bowel loops in this region. It is difficult to determine how much of this air-fluid is free air and fluid versus within bowel loops. No evidence of contrast extravasation. Lungs/Pleura: Examination demonstrates mild bilateral atelectatic change and small effusions. Review of the MIP images confirms the above findings. CT ABDOMEN and PELVIS FINDINGS Hepatobiliary: Previous cholecystectomy.  Liver is unremarkable. Pancreas: Pancreas is within normal. Spleen: Spleen is unremarkable. Adrenals/Urinary Tract: Adrenal glands and kidneys are within normal. Ureters and bladder are unremarkable. Stomach/Bowel: As described above as there is a  persistent large hiatal hernia likely with additional bowel loops within the mid to lower mediastinum. Cannot exclude free fluid and free air in the mid to lower mediastinum. No contrast extravasation. Percutaneous gastrostomy tube over the left upper quadrant appears in  adequate position. Moderate free peritoneal air over the upper abdomen compatible recent surgery and gastrostomy tube placement. Patient had a previous partial colectomy. There are surgical clips over several small bowel loops. There are no definite dilated small bowel loops. Visualize colon is unremarkable. There is mild ascites likely from patient's recent surgery. Vascular/Lymphatic: Within normal. Reproductive: Within normal. Other: Postsurgical change over the anterior abdominal wall. Musculoskeletal: Degenerative change of the spine and hips. Stable L1 mild compression fracture. Tarlov cyst. Review of the MIP images confirms the above findings. IMPRESSION: No evidence of pulmonary embolism. Small bilateral pleural effusions with bilateral atelectasis. Persistent large hiatal hernia with large fluid and air collection within the mid to lower mediastinum some of which may be within bowel loops, although it is difficult to assess how much of this air and fluid is free and related to patient's surgery as well as gastrostomy tube placement. There is contrast within the esophagus and hiatal hernia without contrast extravasation. Gastrostomy tube in adequate position. Postsurgical changes over the upper abdomen with free air and ascites. No evidence of bowel obstruction. Evidence of previous partial colectomy. Stable L1 compression fracture. Electronically Signed   By: Marin Olp M.D.   On: 09/10/2017 00:34   Dg Chest Port 1 View  Result Date: 10/01/2017 CLINICAL DATA:  Fever today. Recent abdominal surgery. Nausea and vomiting. EXAM: PORTABLE CHEST 1 VIEW COMPARISON:  09/14/2017 and 09/11/2017 radiographs. FINDINGS: 1223 hours. Two views obtained. The nasogastric tube has been removed. The right arm PICC appears unchanged with the tip at the mid right atrial level. There is stable cardiomegaly. Patient has a known hiatal hernia. Left pleural effusion and left basilar opacity have increased. Otherwise, there  is improved aeration of the lungs without definite residual edema. There is no pneumothorax. The bones appear unchanged. IMPRESSION: Interval resolution of pulmonary edema. Worsening left pleural effusion and left basilar opacity, likely atelectasis. Electronically Signed   By: Richardean Sale M.D.   On: 10/01/2017 12:46   Dg Chest Port 1 View  Result Date: 09/14/2017 CLINICAL DATA:  Respiratory failure. EXAM: PORTABLE CHEST 1 VIEW COMPARISON:  09/11/2017. FINDINGS: Interim extubation. NG tube tip in the upper portion of the stomach. Right PICC line in unchanged position with tip in right atrium. Cardiomegaly. Diffuse bilateral pulmonary infiltrates consistent pulmonary edema noted on today's exam. Bilateral pleural effusions. Findings consistent CHF. No pneumothorax. IMPRESSION: 1. Interim extubation. NG tube tip noted in the upper portion stomach. Right PICC line tip noted in unchanged position in the right atrium. 2. Cardiomegaly with diffuse pulmonary infiltrates consistent with pulmonary edema noted on today's exam. Bilateral pleural effusions. Electronically Signed   By: Marcello Moores  Register   On: 09/14/2017 06:13   Portable Chest Xray  Result Date: 09/11/2017 CLINICAL DATA:  Acute respiratory failure.  Ventilator support. EXAM: PORTABLE CHEST 1 VIEW COMPARISON:  09/10/2017.  09/09/2017. FINDINGS: Endotracheal tube is 1 cm above the carina. Nasogastric tube enters the abdomen. Right arm PICC tip is in the right atrium. Surgical drain tubes overlie the chest and abdomen. Persistent atelectasis/infiltrate in both lower lobes. IMPRESSION: Lines and tubes well positioned. Mild lower lobe atelectasis/infiltrate following thoracic and abdominal surgery. Electronically Signed   By: Nelson Chimes M.D.   On: 09/11/2017 06:57  Dg Chest Portable 1 View  Result Date: 09/10/2017 CLINICAL DATA:  Central line placement. EXAM: PORTABLE CHEST 1 VIEW COMPARISON:  CT chest 09/09/2017.  One-view chest x-ray 09/09/2017.  FINDINGS: Heart is enlarged. Large hiatal hernia is again noted. The patient has been intubated. The endotracheal tube terminates less than 1 cm from the carina and could be pulled back to 3 cm for more optimal positioning. A left IJ line terminates above the aorta. Location is indeterminate. There is no pneumothorax. An NG tube is in place with decompression of the stomach and large hiatal hernia. Bilateral pleural effusions and basilar airspace disease are again noted. Pulmonary vascular congestion has increased. IMPRESSION: 1. Vascular catheter within the left-sided neck terminates above the aorta. Position is indeterminate. 2. Endotracheal tube terminates less than 1 cm from the carina. 3. Interval decompression of stomach enlarged hiatal hernia following placement of NG tube. 4. Bilateral pleural effusions and associated airspace disease, likely atelectasis. These results were called by telephone at the time of interpretation on 09/10/2017 at 12:58 Pm to Dr. Romana Juniper , who verbally acknowledged these results. Electronically Signed   By: San Morelle M.D.   On: 09/10/2017 13:04   Dg Chest Port 1 View  Result Date: 09/09/2017 CLINICAL DATA:  Follow-up shortness of breath history of a hiatal hernia. EXAM: PORTABLE CHEST 1 VIEW COMPARISON:  Chest x-ray of June 12, 2017 FINDINGS: There is marked dilation of a large diaphragmatic hernia containing gas-filled stomach and possibly bowel. A tubular structure overlies this region which could reflect a G-tube in the appropriate setting. There is bibasilar atelectasis adjacent to the hiatal hernia- intrathoracic stomach. There is no pleural effusion. The cardiac silhouette is enlarged. The pulmonary vascularity is not engorged. IMPRESSION: Subsegmental atelectasis adjacent to the large diaphragmatic hernia. There is gaseous distention of the stomach. There may be bowel loops above the hemidiaphragm as well. No discrete pneumonia. No CHF. Electronically  Signed   By: David  Martinique M.D.   On: 09/09/2017 08:45   Dg Abd Portable 1v  Result Date: 09/14/2017 CLINICAL DATA:  Ileus EXAM: PORTABLE ABDOMEN - 1 VIEW COMPARISON:  09/09/2017 FINDINGS: NG tube tip projects over the mid stomach. Nonobstructive bowel gas pattern. No free air organomegaly. IMPRESSION: No evidence of bowel obstruction or ileus. Electronically Signed   By: Rolm Baptise M.D.   On: 09/14/2017 09:32   Dg Abd Portable 1v  Result Date: 09/09/2017 CLINICAL DATA:  Abdominal pain.  Hiatal hernia repair. EXAM: PORTABLE ABDOMEN - 1 VIEW COMPARISON:  07/28/2017. FINDINGS: Nonobstructive gas pattern. Gastrostomy tube LEFT upper quadrant. No worrisome osseous findings. IMPRESSION: As above. Electronically Signed   By: Staci Righter M.D.   On: 09/09/2017 18:37   Ct Image Guided Drainage By Percutaneous Catheter  Result Date: 09/18/2017 CLINICAL DATA:  Status post abdominal surgery with development of peritonitis. Worsening leukocytosis with cul de sac pelvic abscess. EXAM: CT GUIDED CATHETER DRAINAGE OF PELVIC PERITONEAL ABSCESS ANESTHESIA/SEDATION: 3.0 mg IV Versed 100 mcg IV Fentanyl Total Moderate Sedation Time:  11 minutes The patient's level of consciousness and physiologic status were continuously monitored during the procedure by Radiology nursing. PROCEDURE: The procedure, risks, benefits, and alternatives were explained to the patient. Questions regarding the procedure were encouraged and answered. The patient understands and consents to the procedure. A time out was performed prior to initiating the procedure. The right transgluteal region was prepped with chlorhexidine in a sterile fashion, and a sterile drape was applied covering the operative field. A sterile gown  and sterile gloves were used for the procedure. Local anesthesia was provided with 1% Lidocaine. The patient was placed in a near decubitus position with the right side up. From a posterior transgluteal approach, an 18 gauge  trocar needle was advanced to the level of a pelvic abscess situated posterior to the bladder and anterior to the rectum. After needle entry, aspiration was performed and a fluid sample sent for culture analysis. A guidewire was advanced into the collection. The tract was dilated and a 10 French percutaneous drainage catheter placed. Catheter position was confirmed by CT. The catheter was flushed and connected to a suction bulb. The catheter was secured at the skin with a Prolene retention suture and StatLock device. COMPLICATIONS: None FINDINGS: Aspiration at the level of the pelvic fluid collection yielded bloody and slightly turbid fluid. A sample was sent for culture analysis. After drain placement, there is excellent return of fluid. IMPRESSION: CT-guided percutaneous catheter drainage of pelvic abscess from a posterior right transgluteal approach. There was return of bloody and turbid fluid with a sample sent for culture analysis. A 10 French drain was placed and attached to suction bulb drainage. Electronically Signed   By: Aletta Edouard M.D.   On: 09/18/2017 08:39   Dg Esophagus W/water Sol Cm  Result Date: 09/09/2017 CLINICAL DATA:  Hiatal hernia. Recent gastrostomy tube placement. "Rule out leak." EXAM: ESOPHOGRAM/BARIUM SWALLOW TECHNIQUE: Single contrast examination was performed using water-soluble contrast. FLUOROSCOPY TIME:  Fluoroscopy Time:  1.0 minutes Radiation Exposure Index (if provided by the fluoroscopic device): 10.4 Number of Acquired Spot Images: 0 COMPARISON:  06/10/2017 and chest CT from 05/08/2017 FINDINGS: The patient tolerated taking 3 small sips of water soluble contrast, a total of about 20 cc. After that she refused all further imaging. She would only allow imaging in the supine position hunched forward about 40 degrees by wedge pillows. On the swallows, the esophagus is noted to deviate laterally to the right before emptying into the very large intrathoracic hiatal hernia at  projects over the central chest partially due to the patient positioning. There is some degree of esophageal dysmotility, as well as distal esophageal irregularity. Contrast surrounds the distal esophagus but is probably still within the very large hiatal hernia, which is distended with gas. I tilted the table up about 6 or 7 degrees, which is the most that the patient could tolerate without starting to slide. I was not able to get contrast to empty down into the intra- abdominal portion of the stomach. I am able to see a filling defect in the gastric bubble intra-abdominal portion compatible with the G-tube retention mechanism. IMPRESSION: 1. Today's exam does not exclude leak from the esophagus or stomach. The exam is simply too limited and the patient is not a suitable candidate for fluoroscopic imaging of this type. Esophagram requires the patient's ability to turn a different angles and swallow more than a sip or two of contrast, and this patient's required positioning today, sitting up about 40 degrees with respect to the plane of imaging, is particularly unsuitable to fluoroscopic diagnosis. 2. From what I can tell on the limited assessment achievable, the esophagus is bunched over to the right and empties into a very large hiatal hernia containing at least most of the stomach. The contrast fills along the posterior wall of the hiatal hernia. The esophagus is irregular distally, and ulceration is not excluded. Chest CT might be an alternative although I am not certain that the patient will tolerate laying down  for chest CT. Electronically Signed   By: Van Clines M.D.   On: 09/09/2017 11:25    Labs:  CBC:  Recent Labs  09/25/17 0455 10/01/17 1127 10/03/17 0454 10/04/17 0455  WBC 15.1* 28.0* 24.5* 26.2*  HGB 8.2* 9.1* 8.9* 8.5*  HCT 24.7* 27.7* 27.5* 26.3*  PLT 455* 461* 371 359    COAGS:  Recent Labs  05/06/17 2133 05/07/17 0244 09/17/17 1021  INR 1.18  --  1.24  APTT  --   21*  --     BMP:  Recent Labs  09/26/17 0410 10/01/17 1127 10/02/17 0645 10/03/17 0454 10/04/17 0455  NA 138 141  --  144 146*  K 3.4* 3.6  --  3.3* 3.4*  CL 114* 115*  --  119* 121*  CO2 16* 13*  --  15* 16*  GLUCOSE 107* 69  --  82 86  BUN <5* 9  --  11 12  CALCIUM 8.3* 8.6*  --  8.8* 8.5*  CREATININE 1.13* 1.15* 1.21* 1.22* 1.21*  GFRNONAA 48* 47* 45* 44* 45*  GFRAA 56* 55* 52* 51* 52*    LIVER FUNCTION TESTS:  Recent Labs  09/13/17 0500 09/22/17 0514 09/25/17 0455 10/01/17 1127  BILITOT 1.2 0.9 0.8 1.1  AST _0 ALT 14 12* 10* 9*  ALKPHOS 75 87 86 120  PROT 5.7* 6.1* 7.0 8.4*  ALBUMIN 3.2* 2.2* 2.3* 2.6*    TUMOR MARKERS: No results for input(s): AFPTM, CEA, CA199, CHROMGRNA in the last 8760 hours.  Assessment and Plan: Pelvic fluid collection/abscess s/p perc drain 10/13 CT reviewed with Dr. Pascal Lux, essentially complete resolution and decompression of abscess cavity. Given scant output, feel appropriate to remove drain. Drain removed at bedside. No complications.   Thank you for this interesting consult.  I greatly enjoyed meeting Danielle [name] and look forward to participating in their care.  A copy of this report was sent to the requesting provider on this date.  Electronically Signed: Ascencion Dike, PA-C 10/04/2017, 10:16 AM   I spent a total of 15 minutes in face to face in clinical consultation, greater than 50% of which was counseling/coordinating care for abscess drain care/removal

## 2017-10-04 NOTE — Progress Notes (Addendum)
Peripherally Inserted Central Catheter/Midline Placement  The IV Nurse has discussed with the patient and/or persons authorized to consent for the patient, the purpose of this procedure and the potential benefits and risks involved with this procedure.  The benefits include less needle sticks, lab draws from the catheter, and the patient may be discharged home with the catheter. Risks include, but not limited to, infection, bleeding, blood clot (thrombus formation), and puncture of an artery; nerve damage and irregular heartbeat and possibility to perform a PICC exchange if needed/ordered by physician.  Alternatives to this procedure were also discussed.  Bard Power PICC patient education guide, fact sheet on infection prevention and patient information card has been provided to patient /or left at bedside.    PICC/Midline Placement Documentation  PICC Single Lumen 10/04/17 PICC Left Brachial 40 cm 1 cm (Active)   Attempted PICC placement in the left brachial vein. Unable to get tip to drop into the SVC. PICC tip in left IJ MD Doyle Askew made aware.  Frances Maywood 10/04/2017, 9:43 AM

## 2017-10-04 NOTE — Care Management Important Message (Addendum)
Important Message  Patient Details IM Letter given to Cookie/Case Manager to present to Patient Name: KRYSTIANNA SOTH MRN: [id number] Date of Birth: 11/18/1946   Medicare Important Message Given:  Yes    Kerin Salen 10/04/2017, 11:18 AMImportant Message  Patient Details  Name: BRITTINEE RISK MRN: [id number] Date of Birth: 1946-09-04   Medicare Important Message Given:  Yes    Kerin Salen 10/04/2017, 11:18 AM

## 2017-10-05 ENCOUNTER — Ambulatory Visit (INDEPENDENT_AMBULATORY_CARE_PROVIDER_SITE_OTHER): Payer: Medicare HMO | Admitting: Internal Medicine

## 2017-10-05 ENCOUNTER — Inpatient Hospital Stay (HOSPITAL_COMMUNITY): Payer: Medicare HMO

## 2017-10-05 ENCOUNTER — Encounter (INDEPENDENT_AMBULATORY_CARE_PROVIDER_SITE_OTHER): Payer: Self-pay | Admitting: Internal Medicine

## 2017-10-05 DIAGNOSIS — K659 Peritonitis, unspecified: Secondary | ICD-10-CM

## 2017-10-05 DIAGNOSIS — I82A11 Acute embolism and thrombosis of right axillary vein: Secondary | ICD-10-CM

## 2017-10-05 LAB — BASIC METABOLIC PANEL
Anion gap: 10 (ref 5–15)
BUN: 9 mg/dL (ref 6–20)
CHLORIDE: 121 mmol/L — AB (ref 101–111)
CO2: 14 mmol/L — ABNORMAL LOW (ref 22–32)
Calcium: 8.2 mg/dL — ABNORMAL LOW (ref 8.9–10.3)
Creatinine, Ser: 1.17 mg/dL — ABNORMAL HIGH (ref 0.44–1.00)
GFR calc Af Amer: 54 mL/min — ABNORMAL LOW (ref >60)
GFR calc non Af Amer: 46 mL/min — ABNORMAL LOW (ref >60)
Glucose, Bld: 77 mg/dL (ref 65–99)
POTASSIUM: 3.3 mmol/L — AB (ref 3.5–5.1)
SODIUM: 145 mmol/L (ref 135–145)

## 2017-10-05 LAB — CBC
HEMATOCRIT: 27.6 % — AB (ref 36.0–46.0)
Hemoglobin: 8.8 g/dL — ABNORMAL LOW (ref 12.0–15.0)
MCH: 27.5 pg (ref 26.0–34.0)
MCHC: 31.9 g/dL (ref 30.0–36.0)
MCV: 86.3 fL (ref 78.0–100.0)
Platelets: 368 10*3/uL (ref 150–400)
RBC: 3.2 MIL/uL — AB (ref 3.87–5.11)
RDW: 16.1 % — AB (ref 11.5–15.5)
WBC: 24.4 10*3/uL — AB (ref 4.0–10.5)

## 2017-10-05 MED ORDER — POTASSIUM CHLORIDE 10 MEQ/100ML IV SOLN
10.0000 meq | INTRAVENOUS | Status: DC
  Filled 2017-10-05 (×4): qty 100

## 2017-10-05 MED ORDER — PROMETHAZINE HCL 25 MG PO TABS
25.0000 mg | ORAL_TABLET | Freq: Three times a day (TID) | ORAL | Status: DC | PRN
  Administered 2017-10-05 – 2017-10-07 (×4): 25 mg via ORAL
  Filled 2017-10-05 (×5): qty 1

## 2017-10-05 MED ORDER — POTASSIUM CHLORIDE CRYS ER 20 MEQ PO TBCR: 40.0000 meq | EXTENDED_RELEASE_TABLET | Freq: Once | ORAL | Status: DC

## 2017-10-05 MED ORDER — POTASSIUM CHLORIDE CRYS ER 20 MEQ PO TBCR
20.0000 meq | EXTENDED_RELEASE_TABLET | ORAL | Status: AC
  Administered 2017-10-05 (×2): 20 meq via ORAL
  Filled 2017-10-05 (×2): qty 1

## 2017-10-05 NOTE — Progress Notes (Addendum)
Patient ID: Danielle [name], female   DOB: [birth year], 71 y.o.   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 Postoperative course complicated by required intubation and pressor support, pt was extubated by post op day #2. She continued to improved, tolerated parenteral nutrition, however, eventually developed pelvic fluid collection and has required drain placement, on 10/06 pt found to be fungemic with blood cultures + for C. Glabrata and abscess culture + for C. Albicans and C. Glabrata.  Per d/c summary, pt was able to ambulate with PT, was improving, weaned off parenteral nutrition and tolerated diet, pain controlled. ID recommended Invanz and anidulafungin for at least two more weeks post discharge. Pt was discharged with PICC line placed in R upper arm.   She presented back [facility] ED for evaluation of intermittent fevers up to 101 F at home that she first noted on 10/24. In ED, CT scan notable for an improvement in pelvic fluid collection, Tmax 103, WBC 28 K.   Assessment & Plan: Sepsis  - developed on ertapenem - ? LLL PNA (on CXR left basilar opacity and pleural effusion noted), initially, pt was clinically stable with no concern for PNA - this AM pt with more chest discomfort and mild tachypnea - will ask for CT chest and r/o PNA< may add another antibiotic to current regimen  - attempted to place PICC Line but not successful, will likely have to be done by IR under US guidance if determined it is clinically indicated  - per ID, continue Linezolid for now day #2 and monitor clinical response   Right arm DVT, axillary vein - in arm that had PICC line  - PICC line from right arm  removed - continue AC Lovenox and transition to oral regimen in AM   Abd abscess, C.  Albicans and C. Glabrata + - CT abd noting resolution - IR consulted, JP drain removed   Hypokalemia - still low, supplement and repeat BMP in AM  Thrombocytosis - reactive - resolved   Acute kidney injury, hypernatremia  - pre renal - improving - BMP In AM  DVT prophylaxis: Lovenox  Code Status: Full  Family Communication: Patient and daughter at bedside  Disposition Plan: to be determined   Consultants:   IR  ID  Procedures:   None  Antimicrobials:   Anidulafungin - day # 21  Invanz - day #8  Zosyn 10/04 - 10/20  Linezolid 10/29 -->  Subjective: Pt reports mild dyspnea.   Objective: Vitals:   10/04/17 1638 10/04/17 2031 10/05/17 0551 10/05/17 1212  BP: (!) 113/53 107/62 108/64 (!) 103/59  Pulse: 91 93 (!) 105 90  Resp: (!) 26 20 18    Temp: 98 F (36.7 C) 98.3 F (36.8 C) 100.1 F (37.8 C) 97.7 F (36.5 C)  TempSrc: Oral Oral Oral Axillary  SpO2: 100% 100% 99% 100%  Weight:      Height:        Intake/Output Summary (Last 24 hours) at 10/05/17 1652 Last data filed at 10/04/17 1708  Gross per 24 hour  Intake                0 ml  Output  0 ml  Net                0 ml   Filed Weights   10/02/17 0519  Weight: 68 kg (149 lb 14.6 oz)   Physical Exam  Constitutional: Appears calm, NAD CVS: RRR, S1/S2 +, no murmurs, no gallops, no carotid bruit.  Pulmonary: Effort and breath sounds normal, diminished breath sounds at bases  Abdominal: Soft. BS +,  no distension, tenderness, rebound or guarding.    Data Reviewed: I have personally reviewed following labs and imaging studies  CBC:  Recent Labs Lab 10/01/17 1127 10/03/17 0454 10/04/17 0455 10/05/17 0454  WBC 28.0* 24.5* 26.2* 24.4*  NEUTROABS 19.1*  --   --   --   HGB 9.1* 8.9* 8.5* 8.8*  HCT 27.7* 27.5* 26.3* 27.6*  MCV 86.0 86.5 85.9 86.3  PLT 461* 371 359 595   Basic Metabolic  Panel:  Recent Labs Lab 10/01/17 1127 10/02/17 0645 10/03/17 0454 10/04/17 0455 10/05/17 0454  NA 141  --  144 146* 145  K 3.6  --  3.3* 3.4* 3.3*  CL 115*  --  119* 121* 121*  CO2 13*  --  15* 16* 14*  GLUCOSE 69  --  82 86 77  BUN 9  --  11 12 9   CREATININE 1.15* 1.21* 1.22* 1.21* 1.17*  CALCIUM 8.6*  --  8.8* 8.5* 8.2*   Liver Function Tests:  Recent Labs Lab 10/01/17 1127  AST 17  ALT 9*  ALKPHOS 120  BILITOT 1.1  PROT 8.4*  ALBUMIN 2.6*   Urine analysis:    Component Value Date/Time   COLORURINE YELLOW 09/23/2017 [place] 09/23/2017 0849   LABSPEC 1.008 09/23/2017 0849   PHURINE 7.0 09/23/2017 0849   GLUCOSEU NEGATIVE 09/23/2017 0849   HGBUR SMALL (A) 09/23/2017 0849   BILIRUBINUR NEGATIVE 09/23/2017 [place] 09/23/2017 0849   PROTEINUR NEGATIVE 09/23/2017 0849   UROBILINOGEN 0.2 07/29/2015 2231   NITRITE NEGATIVE 09/23/2017 0849   LEUKOCYTESUR NEGATIVE 09/23/2017 0849   Recent Results (from the past 240 hour(s))  Blood Culture (routine x 2)     Status: None (Preliminary result)   Collection Time: 10/01/17 11:27 AM  Result Value Ref Range Status   Specimen Description BLOOD RIGHT ARM  Final   Special Requests IN PEDIATRIC BOTTLE Blood Culture adequate volume  Final   Culture   Final    NO GROWTH 4 DAYS Performed at [facility], [address] [place], [place] [address]    Report Status PENDING  Incomplete  Blood Culture (routine x 2)     Status: None (Preliminary result)   Collection Time: 10/01/17  6:55 PM  Result Value Ref Range Status   Specimen Description BLOOD LEFT HAND  Final   Special Requests IN PEDIATRIC BOTTLE Blood Culture adequate volume  Final   Culture   Final    NO GROWTH 4 DAYS Performed at [facility], [place] [address], [place], [place] [address]    Report Status PENDING  Incomplete  MRSA PCR Screening     Status: None   Collection Time: 10/04/17 11:03 AM  Result Value Ref Range  Status   MRSA by PCR NEGATIVE NEGATIVE Final    Comment:        The GeneXpert MRSA Assay (FDA approved for NASAL specimens only), is one component of a comprehensive MRSA colonization surveillance program. It is not intended to diagnose MRSA infection  nor to guide or monitor treatment for MRSA infections.     Radiology Studies: Dg Chest Port 1 View  Result Date: 10/04/2017 CLINICAL DATA:  Bedside PICC placement. EXAM: PORTABLE CHEST 1 VIEW COMPARISON:  10/01/2017, 09/14/2017 and earlier. FINDINGS: New left arm PICC courses up into the left internal jugular vein in the tip is not included. The right arm PICC has been removed in the interval. Stable moderate cardiac enlargement. Hiatal hernia as noted previously. Interval decrease in size of the left pleural effusion which is still moderate to large. Stable dense consolidation in the left lower lobe. Improved aeration in the right lung base with mild atelectasis persisting. Mild pulmonary venous hypertension without evidence of pulmonary edema currently. No new pulmonary parenchymal abnormalities. IMPRESSION: 1. Left arm PICC tip courses up into the left internal jugular vein. The catheter should be withdrawn and redirected. 2. Decrease in size of the still moderate to large left pleural effusion since the examination 3 days ago. 3. Stable dense left lower lobe pneumonia and/or atelectasis. 4. Improved aeration in the right lung base with mild atelectasis persisting. 5. No new abnormalities in the lungs. These results will be called to the ordering clinician or representative by the Radiologist Assistant, and communication documented in the PACS or zVision Dashboard. Electronically Signed   By: Evangeline Dakin M.D.   On: 10/04/2017 10:16    Scheduled Meds: . enoxaparin (LOVENOX) injection  70 mg Subcutaneous Q12H  . linezolid  600 mg Oral Q12H  .  morphine injection  4 mg Intravenous Once  . pantoprazole  40 mg Oral Daily  . sodium  bicarbonate  650 mg Oral BID  . spironolactone  50 mg Oral Daily   Continuous Infusions:    LOS: 3 days   Time spent: 25 minutes   Faye Ramsay, MD [facility] Pager [phone]  If 7PM-7AM, please contact night-coverage [email or web address] Password [email or web address] 10/05/2017, 4:52 PM

## 2017-10-05 NOTE — Progress Notes (Signed)
PT Cancellation Note  Patient Details Name: Danielle [name] MRN: [id number] DOB: [birth year]   Cancelled Treatment:    Reason Eval/Treat Not Completed: Patient declined,patient reports that she is going to have breakfast and a bath- it is 1420. Patient  Sharma Covert not consent to mobilizing. [name] 10/05/2017, 2:31 PM  [name] PT [phone]

## 2017-10-05 NOTE — Progress Notes (Signed)
INFECTIOUS DISEASE PROGRESS NOTE  ID: Danielle [name] is a 71 y.o. female with  Active Problems:   SIRS (systemic inflammatory response syndrome) (HCC)   Normochromic normocytic anemia  Subjective: C/o nausea, diarrhea x 3 this Am ("consistent with my Chron's") Per RN has not had diarrhea this AM.  Anorexia +abd pain.   Abtx:  Anti-infectives    Start     Dose/Rate Route Frequency Ordered Stop   10/04/17 1800  linezolid (ZYVOX) tablet 600 mg     600 mg Oral Every 12 hours 10/04/17 1626     10/04/17 1730  linezolid (ZYVOX) tablet 600 mg  Status:  Discontinued     600 mg Oral Every 12 hours 10/04/17 1718 10/04/17 1727   10/02/17 1800  vancomycin (VANCOCIN) IVPB 1000 mg/200 mL premix  Status:  Discontinued     1,000 mg 200 mL/hr over 60 Minutes Intravenous Every 24 hours 10/01/17 1718 10/04/17 1635   10/01/17 1800  ertapenem (INVANZ) 1 g in sodium chloride 0.9 % 50 mL IVPB  Status:  Discontinued     1 g 100 mL/hr over 30 Minutes Intravenous Every 24 hours 10/01/17 1601 10/02/17 1025   10/01/17 1700  anidulafungin (ERAXIS) 100 mg in sodium chloride 0.9 % 100 mL IVPB  Status:  Discontinued     100 mg 78 mL/hr over 100 Minutes Intravenous Every 24 hours 10/01/17 1601 10/04/17 1722   10/01/17 1700  vancomycin (VANCOCIN) IVPB 1000 mg/200 mL premix  Status:  Discontinued     1,000 mg 200 mL/hr over 60 Minutes Intravenous  Once 10/01/17 1656 10/04/17 1627   10/01/17 1600  anidulafungin (ERAXIS) IVPB  Status:  Discontinued    Comments:  Indication:  C glabrata fungemia & intra-abdominal abscess Last Day of Therapy:  10/10/17 Labs - Once weekly:  CBC/D and BMP, Labs - Every other week:  ESR and CRP     100 mg Intravenous Every 24 hours 10/01/17 1547 10/01/17 1601   10/01/17 1600  ertapenem (INVANZ) IVPB  Status:  Discontinued    Comments:  Indication:  Intra-abdominal abscess Last Day of Therapy:  10/10/17 Labs - Once weekly:  CBC/D and BMP, Labs - Every other week:  ESR and CRP     1 g Intravenous Every 24 hours 10/01/17 1547 10/01/17 1601   10/01/17 1100  piperacillin-tazobactam (ZOSYN) IVPB 3.375 g     3.375 g 100 mL/hr over 30 Minutes Intravenous  Once 10/01/17 1046 10/01/17 1657      Medications:  Scheduled: . enoxaparin (LOVENOX) injection  70 mg Subcutaneous Q12H  . linezolid  600 mg Oral Q12H  .  morphine injection  4 mg Intravenous Once  . pantoprazole  40 mg Oral Daily  . potassium chloride  20 mEq Oral Q4H  . sodium bicarbonate  650 mg Oral BID  . spironolactone  50 mg Oral Daily    Objective: Vital signs in last 24 hours: Temp:  [98 F (36.7 C)-100.1 F (37.8 C)] 100.1 F (37.8 C) (10/30 0551) Pulse Rate:  [91-105] 105 (10/30 0551) Resp:  [18-26] 18 (10/30 0551) BP: (107-113)/(53-64) 108/64 (10/30 0551) SpO2:  [99 %-100 %] 99 % (10/30 0551)   General appearance: alert, cooperative and no distress Resp: clear to auscultation bilaterally Cardio: regular rate and rhythm GI: normal findings: bowel sounds normal and mild tenderness.   Lab Results  Recent Labs  10/04/17 0455 10/05/17 0454  WBC 26.2* 24.4*  HGB 8.5* 8.8*  HCT 26.3* 27.6*  NA 146*  145  K 3.4* 3.3*  CL 121* 121*  CO2 16* 14*  BUN 12 9  CREATININE 1.21* 1.17*   Liver Panel No results for input(s): PROT, ALBUMIN, AST, ALT, ALKPHOS, BILITOT, BILIDIR, IBILI in the last 72 hours. Sedimentation Rate No results for input(s): ESRSEDRATE in the last 72 hours. C-Reactive Protein No results for input(s): CRP in the last 72 hours.  Microbiology: Recent Results (from the past 240 hour(s))  Blood Culture (routine x 2)     Status: None (Preliminary result)   Collection Time: 10/01/17 11:27 AM  Result Value Ref Range Status   Specimen Description BLOOD RIGHT ARM  Final   Special Requests IN PEDIATRIC BOTTLE Blood Culture adequate volume  Final   Culture   Final    NO GROWTH 3 DAYS Performed at [facility], [address], [place], [place] [address]    Report Status  PENDING  Incomplete  Blood Culture (routine x 2)     Status: None (Preliminary result)   Collection Time: 10/01/17  6:55 PM  Result Value Ref Range Status   Specimen Description BLOOD LEFT HAND  Final   Special Requests IN PEDIATRIC BOTTLE Blood Culture adequate volume  Final   Culture   Final    NO GROWTH 3 DAYS Performed at [facility], [place] [address], [place], [place] [address]    Report Status PENDING  Incomplete  MRSA PCR Screening     Status: None   Collection Time: 10/04/17 11:03 AM  Result Value Ref Range Status   MRSA by PCR NEGATIVE NEGATIVE Final    Comment:        The GeneXpert MRSA Assay (FDA approved for NASAL specimens only), is one component of a comprehensive MRSA colonization surveillance program. It is not intended to diagnose MRSA infection nor to guide or monitor treatment for MRSA infections.     Studies/Results: Dg Chest Port 1 View  Result Date: 10/04/2017 CLINICAL DATA:  Bedside PICC placement. EXAM: PORTABLE CHEST 1 VIEW COMPARISON:  10/01/2017, 09/14/2017 and earlier. FINDINGS: New left arm PICC courses up into the left internal jugular vein in the tip is not included. The right arm PICC has been removed in the interval. Stable moderate cardiac enlargement. Hiatal hernia as noted previously. Interval decrease in size of the left pleural effusion which is still moderate to large. Stable dense consolidation in the left lower lobe. Improved aeration in the right lung base with mild atelectasis persisting. Mild pulmonary venous hypertension without evidence of pulmonary edema currently. No new pulmonary parenchymal abnormalities. IMPRESSION: 1. Left arm PICC tip courses up into the left internal jugular vein. The catheter should be withdrawn and redirected. 2. Decrease in size of the still moderate to large left pleural effusion since the examination 3 days ago. 3. Stable dense left lower lobe pneumonia and/or atelectasis. 4. Improved aeration in the  right lung base with mild atelectasis persisting. 5. No new abnormalities in the lungs. These results will be called to the ordering clinician or representative by the Radiologist Assistant, and communication documented in the PACS or zVision Dashboard. Electronically Signed   By: Evangeline Dakin M.D.   On: 10/04/2017 10:16     Assessment/Plan: Infected PIC, thrombosis of axillary vein Previous peritonitis, fungal (drain removed 10-29) Prev fungemia Difficult IV access  Total days of antibiotics: 1 zyvox Fever better Leukocytosis persists BCx (-) so far. If negative, final, can stop zyvox.          Dellis Filbert  Connee Ikner MD, FACP Infectious Diseases (pager) [phone] [email or web address] 10/05/2017, 11:41 AM  LOS: 3 days

## 2017-10-05 NOTE — Progress Notes (Signed)
PT Cancellation Note  Patient Details Name: Danielle [name] MRN: [id number] DOB: [birth year]   Cancelled Treatment:    Reason Eval/Treat Not Completed: Medical issues which prohibited therapy. Patient reports that she has had diarrhea and just got settled. Will check back as schedule allows,   [name] 10/05/2017, 12:46 PM [name] PT [phone]

## 2017-10-06 DIAGNOSIS — D473 Essential (hemorrhagic) thrombocythemia: Secondary | ICD-10-CM

## 2017-10-06 DIAGNOSIS — N179 Acute kidney failure, unspecified: Secondary | ICD-10-CM

## 2017-10-06 DIAGNOSIS — Z87898 Personal history of other specified conditions: Secondary | ICD-10-CM

## 2017-10-06 DIAGNOSIS — I82622 Acute embolism and thrombosis of deep veins of left upper extremity: Secondary | ICD-10-CM

## 2017-10-06 DIAGNOSIS — E440 Moderate protein-calorie malnutrition: Secondary | ICD-10-CM

## 2017-10-06 DIAGNOSIS — I82621 Acute embolism and thrombosis of deep veins of right upper extremity: Secondary | ICD-10-CM

## 2017-10-06 LAB — BASIC METABOLIC PANEL
ANION GAP: 10 (ref 5–15)
BUN: 8 mg/dL (ref 6–20)
CHLORIDE: 120 mmol/L — AB (ref 101–111)
CO2: 15 mmol/L — ABNORMAL LOW (ref 22–32)
Calcium: 8.3 mg/dL — ABNORMAL LOW (ref 8.9–10.3)
Creatinine, Ser: 1.21 mg/dL — ABNORMAL HIGH (ref 0.44–1.00)
GFR calc non Af Amer: 45 mL/min — ABNORMAL LOW (ref >60)
GFR, EST AFRICAN AMERICAN: 52 mL/min — AB (ref >60)
Glucose, Bld: 77 mg/dL (ref 65–99)
POTASSIUM: 3.3 mmol/L — AB (ref 3.5–5.1)
SODIUM: 145 mmol/L (ref 135–145)

## 2017-10-06 LAB — CULTURE, BLOOD (ROUTINE X 2)
Culture: NO GROWTH
Culture: NO GROWTH
Special Requests: ADEQUATE
Special Requests: ADEQUATE

## 2017-10-06 LAB — CBC
HCT: 26.4 % — ABNORMAL LOW (ref 36.0–46.0)
HEMOGLOBIN: 8.6 g/dL — AB (ref 12.0–15.0)
MCH: 28 pg (ref 26.0–34.0)
MCHC: 32.6 g/dL (ref 30.0–36.0)
MCV: 86 fL (ref 78.0–100.0)
Platelets: 369 10*3/uL (ref 150–400)
RBC: 3.07 MIL/uL — AB (ref 3.87–5.11)
RDW: 16.3 % — ABNORMAL HIGH (ref 11.5–15.5)
WBC: 23.1 10*3/uL — AB (ref 4.0–10.5)

## 2017-10-06 LAB — MAGNESIUM: MAGNESIUM: 1.5 mg/dL — AB (ref 1.7–2.4)

## 2017-10-06 MED ORDER — MAGNESIUM OXIDE 400 (241.3 MG) MG PO TABS
400.0000 mg | ORAL_TABLET | Freq: Every day | ORAL | Status: DC
  Administered 2017-10-06: 400 mg via ORAL
  Filled 2017-10-06 (×2): qty 1

## 2017-10-06 MED ORDER — BOOST / RESOURCE BREEZE PO LIQD
1.0000 | Freq: Three times a day (TID) | ORAL | Status: DC
  Administered 2017-10-06 – 2017-10-07 (×2): 1 via ORAL

## 2017-10-06 MED ORDER — DIPHENOXYLATE-ATROPINE 2.5-0.025 MG PO TABS
1.0000 | ORAL_TABLET | Freq: Four times a day (QID) | ORAL | Status: DC | PRN
  Administered 2017-10-06: 1 via ORAL
  Filled 2017-10-06: qty 1

## 2017-10-06 MED ORDER — POTASSIUM CHLORIDE CRYS ER 20 MEQ PO TBCR
40.0000 meq | EXTENDED_RELEASE_TABLET | ORAL | Status: AC
  Administered 2017-10-06 (×2): 40 meq via ORAL
  Filled 2017-10-06 (×2): qty 2

## 2017-10-06 NOTE — Progress Notes (Signed)
PT Cancellation Note  Patient Details Name: Danielle [name] MRN: [id number] DOB: [birth year]   Cancelled Treatment:    Reason Eval/Treat Not Completed: Patient declined, patient reports that she does not need PT, that she has been up walking in the room with a  Walker. CNA is only aware of stand by assist for transfer to [facility]. Will attempt again another time.    [name] 10/06/2017, 9:22 AM [name] PT [phone]

## 2017-10-06 NOTE — Progress Notes (Signed)
Co pay for Eliquis is $3.70 for 30 days, for mail in $3.70 for 90 days. Pt will be made aware of this.

## 2017-10-06 NOTE — Progress Notes (Signed)
Patient ID: Danielle [name], female   DOB: 01/11/1946, 71 y.o.   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 Postoperative course complicated by required intubation and pressor support, pt was extubated by post op day #2. She continued to improved, tolerated parenteral nutrition, however, eventually developed pelvic fluid collection and has required drain placement, on 10/06 pt found to be fungemic with blood cultures + for C. Glabrata and abscess culture + for C. Albicans and C. Glabrata.  Per d/c summary, pt was able to ambulate with PT, was improving, weaned off parenteral nutrition and tolerated diet, pain controlled. ID recommended Invanz and anidulafungin for at least two more weeks post discharge. Pt was discharged with PICC line placed in R upper arm.   She presented back [facility] ED for evaluation of intermittent fevers up to 101 F at home that she first noted on 10/24. In ED, CT scan notable for an improvement in pelvic fluid collection, Tmax 103, WBC 28 K.   Assessment & Plan: Sepsis  - developed while on ertapenem - ? LLL PNA (on CXR left basilar opacity and pleural effusion noted), initially, pt was clinically stable with no concern for PNA. - no PNA; will stop abx's. - per ID, continue Linezolid for now day #2 and monitor clinical response; per ID ok to d/c antibiotics and observe -repeated cx's remains neg/no growth.  -SIRS features essentially resolved and appears to be secondary to dehydration and post infec  Right arm DVT, axillary vein - in arm that had PICC line  - PICC line from right arm removed now - patient on lovenox and with plans to transitioned her to eliequis  (CM assessing cost)  Abd abscess, C.  Albicans and C. Glabrata + - CT abd noting resolution - IR consulted, JP drain removed  -no further antifungals needed.  Hypokalemia/hypomagnesemia -will replete as needed and follow trend  Thrombocytosis - reactive - resolved  -will follow platelets trend intermittently   Acute kidney injury, hypernatremia  -pre renal -continue improving  -follow renal function and electrolytes intermittently.  DVT prophylaxis: Lovenox  Code Status: Full  Family Communication: no family at bedside  Disposition Plan: to be determined, will observe off antibiotics overnight, if remain stable will d/c home.  Consultants:   IR  ID  Procedures:   None  Antimicrobials:   Anidulafungin - day # 21  Invanz - day #8  Zosyn 10/04 - 10/20  Linezolid 10/29 -->10-31  Subjective: Afebrile currently, no CP and no SOB. Patient feeling ok overall. Reports poor appetite and general decondit   Objective: Vitals:   10/06/17 0542 10/06/17 1427 10/06/17 1432 10/06/17 2137  BP: (!) 92/52 105/60  105/61  Pulse: 95 (!) 105  (!) 110  Resp: 18 20  20   Temp: 98.6 F (37 C) 97.8 F (36.6 C)  (!) 100.9 F (38.3 C)  TempSrc: Oral Oral  Oral  SpO2: 99% 98% 98% 99%  Weight:      Height:        Intake/Output Summary (Last 24 hours) at 10/06/17 2336 Last data filed at 10/06/17 1437  Gross per 24 hour  Intake  462 ml  Output                0 ml  Net              462 ml   Filed Weights   10/02/17 0519  Weight: 68 kg (149 lb 14.6 oz)   Physical Exam  Constitutional: afebrile, no CP, no SOB. Patient with poor appetite and reports loose stools.  CVS: RRR, no rubs, no gallops  Pulmonary: normal resp effort, no wheezing, no crackles. Abdominal: soft, no tenderness, positive BS, no guarding. Extremities: no edema, no cyanosis.    Data Reviewed: I have personally reviewed following labs and imaging studies  CBC:  Recent Labs Lab  10/01/17 1127 10/03/17 0454 10/04/17 0455 10/05/17 0454 10/06/17 0444  WBC 28.0* 24.5* 26.2* 24.4* 23.1*  NEUTROABS 19.1*  --   --   --   --   HGB 9.1* 8.9* 8.5* 8.8* 8.6*  HCT 27.7* 27.5* 26.3* 27.6* 26.4*  MCV 86.0 86.5 85.9 86.3 86.0  PLT 461* 371 359 368 703   Basic Metabolic Panel:  Recent Labs Lab 10/01/17 1127 10/02/17 0645 10/03/17 0454 10/04/17 0455 10/05/17 0454 10/06/17 0444  NA 141  --  144 146* 145 145  K 3.6  --  3.3* 3.4* 3.3* 3.3*  CL 115*  --  119* 121* 121* 120*  CO2 13*  --  15* 16* 14* 15*  GLUCOSE 69  --  82 86 77 77  BUN 9  --  11 12 9 8   CREATININE 1.15* 1.21* 1.22* 1.21* 1.17* 1.21*  CALCIUM 8.6*  --  8.8* 8.5* 8.2* 8.3*  MG  --   --   --   --   --  1.5*   Liver Function Tests:  Recent Labs Lab 10/01/17 1127  AST 17  ALT 9*  ALKPHOS 120  BILITOT 1.1  PROT 8.4*  ALBUMIN 2.6*   Urine analysis:    Component Value Date/Time   COLORURINE YELLOW 09/23/2017 [place] 09/23/2017 0849   LABSPEC 1.008 09/23/2017 0849   PHURINE 7.0 09/23/2017 0849   GLUCOSEU NEGATIVE 09/23/2017 0849   HGBUR SMALL (A) 09/23/2017 0849   BILIRUBINUR NEGATIVE 09/23/2017 [place] 09/23/2017 0849   PROTEINUR NEGATIVE 09/23/2017 0849   UROBILINOGEN 0.2 07/29/2015 2231   NITRITE NEGATIVE 09/23/2017 0849   LEUKOCYTESUR NEGATIVE 09/23/2017 0849   Recent Results (from the past 240 hour(s))  Blood Culture (routine x 2)     Status: None   Collection Time: 10/01/17 11:27 AM  Result Value Ref Range Status   Specimen Description BLOOD RIGHT ARM  Final   Special Requests IN PEDIATRIC BOTTLE Blood Culture adequate volume  Final   Culture   Final    NO GROWTH 5 DAYS Performed at [facility], [address], [place], [place] [address]    Report Status 10/06/2017 FINAL  Final  Blood Culture (routine x 2)     Status: None   Collection Time: 10/01/17  6:55 PM  Result Value Ref Range Status   Specimen Description BLOOD LEFT HAND   Final   Special Requests IN PEDIATRIC BOTTLE Blood Culture adequate volume  Final   Culture   Final    NO GROWTH 5 DAYS Performed at [facility], [place] [address] [place], [place] [address]    Report Status 10/06/2017 FINAL  Final  MRSA PCR Screening     Status: None   Collection  Time: 10/04/17 11:03 AM  Result Value Ref Range Status   MRSA by PCR NEGATIVE NEGATIVE Final    Comment:        The GeneXpert MRSA Assay (FDA approved for NASAL specimens only), is one component of a comprehensive MRSA colonization surveillance program. It is not intended to diagnose MRSA infection nor to guide or monitor treatment for MRSA infections.     Radiology Studies: Ct Chest Wo Contrast  Result Date: 10/05/2017 CLINICAL DATA:  71 y/o F; shortness of breath with history of hiatal hernia, Crohn's disease, psoriasis, an small bowel obstruction. EXAM: CT CHEST WITHOUT CONTRAST TECHNIQUE: Multidetector CT imaging of the chest was performed following the standard protocol without IV contrast. COMPARISON:  09/09/2017 CT chest.  04/29/2017 chest radiograph. FINDINGS: Cardiovascular: No significant vascular findings. Normal heart size. No pericardial effusion. Mild coronary artery calcification. Mediastinum/Nodes: Large hiatal hernia. Patulous fluid-filled esophagus. Right lobe of thyroid nodule measuring up to 18 mm (series 2, image 20). Lungs/Pleura: Small right and moderate left pleural effusions. Right lower lobe platelike atelectasis. Partial consolidation of the left lower lobe. Mild interlobular septal thickening compatible with interstitial edema. Upper Abdomen: Cholecystectomy. Musculoskeletal: Multiple chronic bilateral rib fractures. Stable mild anterior L1 compression deformity. No acute osseous abnormality identified. IMPRESSION: 1. Small right and moderate left pleural effusions. Mild interstitial pulmonary edema. 2. Partial consolidation of left lower lobe may represent atelectasis or  pneumonia. 3. Large hiatal hernia. Patulous fluid-filled esophagus indicating reflux. 4. Right lobe of thyroid nodule measuring 18 mm, thyroid ultrasound recommended on a nonemergent basis. 5. Mild coronary artery calcification. Electronically Signed   By: Kristine Garbe M.D.   On: 10/05/2017 19:59    Scheduled Meds: . enoxaparin (LOVENOX) injection  70 mg Subcutaneous Q12H  . feeding supplement  1 Container Oral TID BM  . magnesium oxide  400 mg Oral Daily  .  morphine injection  4 mg Intravenous Once  . pantoprazole  40 mg Oral Daily  . sodium bicarbonate  650 mg Oral BID  . spironolactone  50 mg Oral Daily   Continuous Infusions:    LOS: 4 days   Time spent: 25 minutes   Barton Dubois, MD [facility] Pager [phone]  If 7PM-7AM, please contact night-coverage [email or web address] Password [email or web address] 10/06/2017, 11:36 PM

## 2017-10-06 NOTE — Progress Notes (Signed)
INFECTIOUS DISEASE PROGRESS NOTE  ID: Danielle [name] is a 71 y.o. female with  Active Problems:   SIRS (systemic inflammatory response syndrome) (HCC)   Normochromic normocytic anemia  Subjective: Resting, no complaints on awakening.   Abtx:  Anti-infectives    Start     Dose/Rate Route Frequency Ordered Stop   10/04/17 1800  linezolid (ZYVOX) tablet 600 mg     600 mg Oral Every 12 hours 10/04/17 1626     10/04/17 1730  linezolid (ZYVOX) tablet 600 mg  Status:  Discontinued     600 mg Oral Every 12 hours 10/04/17 1718 10/04/17 1727   10/02/17 1800  vancomycin (VANCOCIN) IVPB 1000 mg/200 mL premix  Status:  Discontinued     1,000 mg 200 mL/hr over 60 Minutes Intravenous Every 24 hours 10/01/17 1718 10/04/17 1635   10/01/17 1800  ertapenem (INVANZ) 1 g in sodium chloride 0.9 % 50 mL IVPB  Status:  Discontinued     1 g 100 mL/hr over 30 Minutes Intravenous Every 24 hours 10/01/17 1601 10/02/17 1025   10/01/17 1700  anidulafungin (ERAXIS) 100 mg in sodium chloride 0.9 % 100 mL IVPB  Status:  Discontinued     100 mg 78 mL/hr over 100 Minutes Intravenous Every 24 hours 10/01/17 1601 10/04/17 1722   10/01/17 1700  vancomycin (VANCOCIN) IVPB 1000 mg/200 mL premix  Status:  Discontinued     1,000 mg 200 mL/hr over 60 Minutes Intravenous  Once 10/01/17 1656 10/04/17 1627   10/01/17 1600  anidulafungin (ERAXIS) IVPB  Status:  Discontinued    Comments:  Indication:  C glabrata fungemia & intra-abdominal abscess Last Day of Therapy:  10/10/17 Labs - Once weekly:  CBC/D and BMP, Labs - Every other week:  ESR and CRP     100 mg Intravenous Every 24 hours 10/01/17 1547 10/01/17 1601   10/01/17 1600  ertapenem (INVANZ) IVPB  Status:  Discontinued    Comments:  Indication:  Intra-abdominal abscess Last Day of Therapy:  10/10/17 Labs - Once weekly:  CBC/D and BMP, Labs - Every other week:  ESR and CRP     1 g Intravenous Every 24 hours 10/01/17 1547 10/01/17 1601   10/01/17 1100   piperacillin-tazobactam (ZOSYN) IVPB 3.375 g     3.375 g 100 mL/hr over 30 Minutes Intravenous  Once 10/01/17 1046 10/01/17 1657      Medications:  Scheduled: . enoxaparin (LOVENOX) injection  70 mg Subcutaneous Q12H  . feeding supplement  1 Container Oral TID BM  . linezolid  600 mg Oral Q12H  . magnesium oxide  400 mg Oral Daily  .  morphine injection  4 mg Intravenous Once  . pantoprazole  40 mg Oral Daily  . potassium chloride  40 mEq Oral Q4H  . sodium bicarbonate  650 mg Oral BID  . spironolactone  50 mg Oral Daily    Objective: Vital signs in last 24 hours: Temp:  [97.8 F (36.6 C)-98.6 F (37 C)] 97.8 F (36.6 C) (10/31 1427) Pulse Rate:  [84-105] 105 (10/31 1427) Resp:  [18-20] 20 (10/31 1427) BP: (92-107)/(52-63) 105/60 (10/31 1427) SpO2:  [97 %-99 %] 98 % (10/31 1432)   General appearance: no distress Resp: clear to auscultation bilaterally Cardio: regular rate and rhythm GI: normal findings: bowel sounds normal and soft, non-tender Extremities: no cordis felt in RUE.   Lab Results  Recent Labs  10/05/17 0454 10/06/17 0444  WBC 24.4* 23.1*  HGB 8.8* 8.6*  HCT  27.6* 26.4*  NA 145 145  K 3.3* 3.3*  CL 121* 120*  CO2 14* 15*  BUN 9 8  CREATININE 1.17* 1.21*   Liver Panel No results for input(s): PROT, ALBUMIN, AST, ALT, ALKPHOS, BILITOT, BILIDIR, IBILI in the last 72 hours. Sedimentation Rate No results for input(s): ESRSEDRATE in the last 72 hours. C-Reactive Protein No results for input(s): CRP in the last 72 hours.  Microbiology: Recent Results (from the past 240 hour(s))  Blood Culture (routine x 2)     Status: None   Collection Time: 10/01/17 11:27 AM  Result Value Ref Range Status   Specimen Description BLOOD RIGHT ARM  Final   Special Requests IN PEDIATRIC BOTTLE Blood Culture adequate volume  Final   Culture   Final    NO GROWTH 5 DAYS Performed at [facility], [address], [place], [place] [address]    Report Status  10/06/2017 FINAL  Final  Blood Culture (routine x 2)     Status: None   Collection Time: 10/01/17  6:55 PM  Result Value Ref Range Status   Specimen Description BLOOD LEFT HAND  Final   Special Requests IN PEDIATRIC BOTTLE Blood Culture adequate volume  Final   Culture   Final    NO GROWTH 5 DAYS Performed at [facility], [place] [address] [place], [place] [address]    Report Status 10/06/2017 FINAL  Final  MRSA PCR Screening     Status: None   Collection Time: 10/04/17 11:03 AM  Result Value Ref Range Status   MRSA by PCR NEGATIVE NEGATIVE Final    Comment:        The GeneXpert MRSA Assay (FDA approved for NASAL specimens only), is one component of a comprehensive MRSA colonization surveillance program. It is not intended to diagnose MRSA infection nor to guide or monitor treatment for MRSA infections.     Studies/Results: Ct Chest Wo Contrast  Result Date: 10/05/2017 CLINICAL DATA:  71 y/o F; shortness of breath with history of hiatal hernia, Crohn's disease, psoriasis, an small bowel obstruction. EXAM: CT CHEST WITHOUT CONTRAST TECHNIQUE: Multidetector CT imaging of the chest was performed following the standard protocol without IV contrast. COMPARISON:  09/09/2017 CT chest.  04/29/2017 chest radiograph. FINDINGS: Cardiovascular: No significant vascular findings. Normal heart size. No pericardial effusion. Mild coronary artery calcification. Mediastinum/Nodes: Large hiatal hernia. Patulous fluid-filled esophagus. Right lobe of thyroid nodule measuring up to 18 mm (series 2, image 20). Lungs/Pleura: Small right and moderate left pleural effusions. Right lower lobe platelike atelectasis. Partial consolidation of the left lower lobe. Mild interlobular septal thickening compatible with interstitial edema. Upper Abdomen: Cholecystectomy. Musculoskeletal: Multiple chronic bilateral rib fractures. Stable mild anterior L1 compression deformity. No acute osseous abnormality identified.  IMPRESSION: 1. Small right and moderate left pleural effusions. Mild interstitial pulmonary edema. 2. Partial consolidation of left lower lobe may represent atelectasis or pneumonia. 3. Large hiatal hernia. Patulous fluid-filled esophagus indicating reflux. 4. Right lobe of thyroid nodule measuring 18 mm, thyroid ultrasound recommended on a nonemergent basis. 5. Mild coronary artery calcification. Electronically Signed   By: Kristine Garbe M.D.   On: 10/05/2017 19:59     Assessment/Plan: thrombosis of axillary vein Previous peritonitis, fungal (drain removed 10-29) Prev fungemia Difficult IV access  Total days of antibiotics: 2 zyvox Fever better Leukocytosis persists BCx (-), will stop zyvox. Available as needed.           Bobby Rumpf MD, FACP Infectious Diseases (pager) [phone] [email or web address] 10/06/2017, 3:23 PM  LOS: 4 days

## 2017-10-07 ENCOUNTER — Encounter (HOSPITAL_COMMUNITY)
Admission: RE | Admit: 2017-10-07 | Discharge: 2017-10-07 | Disposition: A | Discharge status: Home / Self Care | Payer: Medicare HMO | Source: Ambulatory Visit | Attending: Internal Medicine | Admitting: Internal Medicine

## 2017-10-07 DIAGNOSIS — E872 Acidosis, unspecified: Secondary | ICD-10-CM

## 2017-10-07 DIAGNOSIS — D473 Essential (hemorrhagic) thrombocythemia: Secondary | ICD-10-CM

## 2017-10-07 DIAGNOSIS — D75839 Thrombocytosis, unspecified: Secondary | ICD-10-CM

## 2017-10-07 DIAGNOSIS — I82409 Acute embolism and thrombosis of unspecified deep veins of unspecified lower extremity: Secondary | ICD-10-CM

## 2017-10-07 DIAGNOSIS — E440 Moderate protein-calorie malnutrition: Secondary | ICD-10-CM

## 2017-10-07 MED ORDER — PROCHLORPERAZINE MALEATE 10 MG PO TABS: 10.0000 mg | ORAL_TABLET | Freq: Four times a day (QID) | ORAL | 0 refills | Status: AC | PRN

## 2017-10-07 MED ORDER — APIXABAN 5 MG PO TABS: 5.0000 mg | ORAL_TABLET | Freq: Two times a day (BID) | ORAL | Status: DC

## 2017-10-07 MED ORDER — APIXABAN 5 MG PO TABS: 10.0000 mg | ORAL_TABLET | Freq: Two times a day (BID) | ORAL | Status: DC

## 2017-10-07 MED ORDER — BOOST / RESOURCE BREEZE PO LIQD: 1.0000 | Freq: Three times a day (TID) | ORAL | Status: AC

## 2017-10-07 MED ORDER — APIXABAN 5 MG PO TABS: ORAL_TABLET | ORAL | 3 refills | Status: AC

## 2017-10-07 MED ORDER — MAGNESIUM OXIDE 400 (241.3 MG) MG PO TABS: 400.0000 mg | ORAL_TABLET | Freq: Every day | ORAL | 0 refills | Status: AC

## 2017-10-07 MED ORDER — ACETAMINOPHEN 325 MG PO TABS: 650.0000 mg | ORAL_TABLET | Freq: Four times a day (QID) | ORAL | 0 refills | Status: AC | PRN

## 2017-10-07 NOTE — Progress Notes (Signed)
[place] for enoxaparin to apixaban Indication: DVT  Allergies  Allergen Reactions  . Zithromax [Azithromycin] Swelling and Other (See Comments)    Mouth swelling and blisters   . Aspirin Other (See Comments)    Stomach cramping   . Ibuprofen Other (See Comments)    Pt states that she prefers not to take this medication.   . Iron Nausea And Vomiting and Other (See Comments)    FERROUS SULFATE  . Sulfa Antibiotics Nausea And Vomiting    Patient Measurements: Height: 5\' 5"  (165.1 cm) Weight: 149 lb 14.6 oz (68 kg) IBW/kg (Calculated) : 57 Heparin Dosing Weight:   Vital Signs: Temp: 98.1 F (36.7 C) (11/01 0630) Temp Source: Oral (11/01 0630) BP: 117/68 (11/01 0630) Pulse Rate: 118 (11/01 0630)  Labs:  Recent Labs  10/04/17 1752 10/05/17 0454 10/06/17 0444  HGB  --  8.8* 8.6*  HCT  --  27.6* 26.4*  PLT  --  368 369  CREATININE  --  1.17* 1.21*  TROPONINI <0.03  --   --     Estimated Creatinine Clearance: 39.5 mL/min (A) (by C-G formula based on SCr of 1.21 mg/dL (H)).   Medical History: Past Medical History:  Diagnosis Date  . Crohn disease ([place])   . Hiatal hernia 12/07/2015  . Left buttock abscess 04/21/2017  . Psoriasis   . SBO (small bowel obstruction) ([place]) 07/27/2017    Assessment: 50 YOF found to have a DVT in RUE (site of PICC).  Pharmacy asked to dose enoxaparin.  Orders to change to apixaban 11/1  Today, 10/07/2017  Renal: mild increase in SCr (remains stable) but CrCl > 1ml/min CBC: reveals anemia (Hgb stable), pltc WNL Last dose of enoxaparin was ~8a 11/1  Goal of Therapy:  Dose per indication, patient-specific parameters   Plan:   Start apixaban 10mg  PO BID x 7 days (1st dose tonight at ~20:00) then 5mg  PO BID  Monitor for bleeding  Doreene Eland, PharmD, BCPS.   Pager: [phone] 10/07/2017 10:42 AM

## 2017-10-07 NOTE — Discharge Instructions (Signed)
Information on my medicine - ELIQUIS (apixaban)  This medication education was reviewed with me or my healthcare representative as part of my discharge preparation.  The pharmacist that spoke with me during my hospital stay was:  Clovis Riley, [facility]  Why was Eliquis prescribed for you? Eliquis was prescribed to treat blood clots that may have been found in the veins of your legs (deep vein thrombosis) or in your lungs (pulmonary embolism) and to reduce the risk of them occurring again.  What do You need to know about Eliquis ? The starting dose is 10 mg (two 5 mg tablets) taken TWICE daily for the FIRST SEVEN (7) DAYS, then on 11/01/2017  the dose is reduced to ONE 5 mg tablet taken TWICE daily.  Eliquis may be taken with or without food.   Try to take the dose about the same time in the morning and in the evening. If you have difficulty swallowing the tablet whole please discuss with your pharmacist how to take the medication safely.  Take Eliquis exactly as prescribed and DO NOT stop taking Eliquis without talking to the doctor who prescribed the medication.  Stopping may increase your risk of developing a new blood clot.  Refill your prescription before you run out.  After discharge, you should have regular check-up appointments with your healthcare provider that is prescribing your Eliquis.    What do you do if you miss a dose? If a dose of ELIQUIS is not taken at the scheduled time, take it as soon as possible on the same day and twice-daily administration should be resumed. The dose should not be doubled to make up for a missed dose.  Important Safety Information A possible side effect of Eliquis is bleeding. You should call your healthcare provider right away if you experience any of the following: ? Bleeding from an injury or your nose that does not stop. ? Unusual colored urine (red or dark brown) or unusual colored stools (red or black). ? Unusual bruising for  unknown reasons. ? A serious fall or if you hit your head (even if there is no bleeding).  Some medicines may interact with Eliquis and might increase your risk of bleeding or clotting while on Eliquis. To help avoid this, consult your healthcare provider or pharmacist prior to using any new prescription or non-prescription medications, including herbals, vitamins, non-steroidal anti-inflammatory drugs (NSAIDs) and supplements.  This website has more information on Eliquis (apixaban): http://www.eliquis.com/eliquis/home

## 2017-10-07 NOTE — Progress Notes (Signed)
Patient and her family given discharge, follow up, and medication instructions, verbalized understanding, no IV access, prescriptions and personal belongings with patient, family to transport home

## 2017-10-07 NOTE — Discharge Summary (Signed)
Physician Discharge Summary  Danielle [name] VWU:[id number] DOB: 01-Sep-1946 DOA: 10/01/2017  PCP: Iona Beard, MD  Admit date: 10/01/2017 Discharge date: 10/07/2017  Time spent: 35 minutes  Recommendations for Outpatient Follow-up:  Repeat CBC to follow WBC's and Hgb trend  Repeat BMET to follow electrolytes and renal function   Discharge Diagnoses:  Active Problems:   SIRS (systemic inflammatory response syndrome) (HCC)   Normochromic normocytic anemia   DVT (deep venous thrombosis) (HCC)   Metabolic acidosis   Thrombocytosis (HCC)   Protein-calorie malnutrition, moderate (HCC) recent fungemia  crohn's disease Upper extremity DVT; right arm Abdominal abscess AKI/hypernatremia Hypokalemia/hypomagnesemia  Discharge Condition: stable and improved. Patient discharge home with home health services. Will follow up with PCP and general surgery as an outpatient.  Diet recommendation:   Filed Weights   10/02/17 0519  Weight: 68 kg (149 lb 14.6 oz)    History of present illness: 71 yo female who was just recently hospitalized from 10/03 until 09/26/2017 when she underwent surgery to type IV paraesophageal hiatal hernia, and required re operation due to leakage from gastrostomy tube. Postoperative course complicated by required intubation and pressor support, pt was extubated by post op day #2. She continued to improved, tolerated parenteral nutrition, however, eventually developed pelvic fluid collection and has required drain placement, on 10/06 pt found to be fungemic with blood cultures + for C. Glabrata and abscess culture + for C. Albicans and C. Glabrata.  Per d/c summary, pt was able to ambulate with PT, was improving, weaned off parenteral nutrition and tolerated diet, pain controlled. ID recommended Invanz and anidulafungin for at least two more weeks post discharge. Pt was discharged with PICC line placed in R upper arm.   She presented back [facility] ED for evaluation of  intermittent fevers up to 101 F at home that she first noted on 10/24. In ED, CT scan notable for an improvement in pelvic fluid collection, Tmax 103, WBC 28 K.   Hospital Course:  SIRS/Sepsis  - developed while on ertapenem - ? LLL PNA (on CXR left basilar opacity and pleural effusion noted), pt was clinically stable with no concern for PNA. - per ID, patient was placed on linezolid and cultures were obtained. Patient SIRS features resolved and no signs of active infection appreciated. Antibiotics discontinued and patient observed for 24 hours off antibiotics prior to discharge. -repeated cx's remains neg/no growth.  -SIRS features essentially resolved and appears to be secondary to dehydration and post infection process  Right arm DVT, axillary vein - in arm that had PICC line  - PICC line from right arm removed  - patient was on lovenox and with plans to discharge with eliquis (will need at least 3 months of anticoagulation therapy)  Abd abscess, C.  Albicans and C. Glabrata + -CT abd demonstarted resolution - IR consulted and JP drain removed  -no further antifungals needed per ID recommendations. -continue wound care [facility] RN arranged for assistance and teaching on wound care)  Hypokalemia/hypomagnesemia -repleted -appears to be secondary to GI loses and use of diuretics -repeat BMET at follow up and if needed start maintenance repletion  Thrombocytosis - reactive - resolved  -recommend CBC to follow platelets count    Acute kidney injury, hypernatremia  -pre renal in nature -improved and back to normal at discharge -will recommend BMET at follow to reassess renal function and electrolytes. -patient advise to keep herself well hydrated   Procedures:  See below for x-ray reports   Consultations:  ID  IR  Discharge Exam: Vitals:   10/06/17 2137 10/07/17 0630  BP: 105/61 117/68  Pulse: (!) 110 (!) 118  Resp: 20 20  Temp: (!) 100.9 F (38.3 C) 98.1 F (36.7  C)  SpO2: 99% 98%    Constitutional: afebrile, no CP, no SOB. Patient with poor appetite, some intermittent nausea and reporting loose stools.  CVS: RRR, no rubs, no gallops, no JVD  Pulmonary: normal resp effort, no wheezing, no crackles. Good O2 sat on RA Abdominal: soft, no tenderness, positive BS, no guarding. Clean dressings in place Extremities: no edema, no cyanosis.   Discharge Instructions   Discharge Instructions    Discharge instructions    Complete by:  As directed    Keep yourself well hydrated Take medications as prescribed  Arranged follow up with PCP in 10 days     Current Discharge Medication List    START taking these medications   Details  acetaminophen (TYLENOL) 325 MG tablet Take 2 tablets (650 mg total) by mouth every 6 (six) hours as needed for mild pain or moderate pain. Qty: 40 tablet, Refills: 0    apixaban (ELIQUIS) 5 MG TABS tablet Take 10 mg twice a day for 7 days and then start using 5 mg BID. Qty: 60 tablet, Refills: 3    feeding supplement (BOOST / RESOURCE BREEZE) LIQD Take 1 Container by mouth 3 (three) times daily between meals.    magnesium oxide (MAG-OX) 400 (241.3 Mg) MG tablet Take 1 tablet (400 mg total) by mouth daily. Qty: 30 tablet, Refills: 0    prochlorperazine (COMPAZINE) 10 MG tablet Take 1 tablet (10 mg total) by mouth every 6 (six) hours as needed for refractory nausea / vomiting. Qty: 30 tablet, Refills: 0      CONTINUE these medications which have NOT CHANGED   Details  albuterol (PROVENTIL HFA;VENTOLIN HFA) 108 (90 Base) MCG/ACT inhaler Inhale 2 puffs into the lungs every 6 (six) hours as needed for wheezing. Qty: 1 Inhaler, Refills: 2    cyanocobalamin (,VITAMIN B-12,) 1000 MCG/ML injection INJECT 1000 MCG IM ONCE MONTHLY Qty: 1 mL, Refills: 11    dicyclomine (BENTYL) 10 MG/5ML syrup TAKE 10 MLS (20 MG TOTAL) BY MOUTH 3 (THREE) TIMES DAILY AS NEEDED. Qty: 300 mL, Refills: 3    diphenoxylate-atropine (LOMOTIL)  2.5-0.025 MG tablet TAKE 1 TABLET BY MOUTH FOUR TIMES A DAY AS NEEDED Qty: 90 tablet, Refills: 0    furosemide (LASIX) 20 MG tablet Take 1 tablet (20 mg total) by mouth daily as needed (Take for weight gain of more than 2 pounds in a day or 5 pounds in 2 days.). Qty: 30 tablet, Refills: 0    HYDROcodone-acetaminophen (NORCO) 7.5-325 MG tablet Take 1 tablet by mouth every 6 (six) hours as needed for moderate pain or severe pain. Qty: 30 tablet, Refills: 0    nystatin (NYSTATIN) powder Apply 1 g topically 2 (two) times daily as needed (for itching/irritation).    pantoprazole (PROTONIX) 40 MG tablet Take 1 tablet (40 mg total) by mouth daily.    promethazine (PHENERGAN) 25 MG tablet TAKE 1 TABLET (25 MG TOTAL) BY MOUTH 2 (TWO) TIMES DAILY AS NEEDED FOR NAUSEA OR VOMITING. Qty: 20 tablet, Refills: 0    Simethicone (PHAZYME PO) Take 1 tablet by mouth daily as needed (gas).     sodium bicarbonate 650 MG tablet Take 650 mg by mouth 2 (two) times daily.    spironolactone (ALDACTONE) 50 MG tablet Take 50 mg by  mouth daily.       STOP taking these medications     anidulafungin (ERAXIS) IVPB      capsicum oleoresin (TRIXAICIN) 0.025 % cream      ertapenem (INVANZ) IVPB        Allergies  Allergen Reactions  . Zithromax [Azithromycin] Swelling and Other (See Comments)    Mouth swelling and blisters   . Aspirin Other (See Comments)    Stomach cramping   . Ibuprofen Other (See Comments)    Pt states that she prefers not to take this medication.   . Iron Nausea And Vomiting and Other (See Comments)    FERROUS SULFATE  . Sulfa Antibiotics Nausea And Vomiting   Follow-up Information    Iona Beard, MD. Schedule an appointment as soon as possible for a visit in 10 day(s).   Specialty:  Family Medicine Contact information: [address]  [phone]            The results of significant diagnostics from this hospitalization (including imaging,  microbiology, ancillary and laboratory) are listed below for reference.    Significant Diagnostic Studies: Ct Chest Wo Contrast  Result Date: 10/05/2017 CLINICAL DATA:  71 y/o F; shortness of breath with history of hiatal hernia, Crohn's disease, psoriasis, an small bowel obstruction. EXAM: CT CHEST WITHOUT CONTRAST TECHNIQUE: Multidetector CT imaging of the chest was performed following the standard protocol without IV contrast. COMPARISON:  09/09/2017 CT chest.  04/29/2017 chest radiograph. FINDINGS: Cardiovascular: No significant vascular findings. Normal heart size. No pericardial effusion. Mild coronary artery calcification. Mediastinum/Nodes: Large hiatal hernia. Patulous fluid-filled esophagus. Right lobe of thyroid nodule measuring up to 18 mm (series 2, image 20). Lungs/Pleura: Small right and moderate left pleural effusions. Right lower lobe platelike atelectasis. Partial consolidation of the left lower lobe. Mild interlobular septal thickening compatible with interstitial edema. Upper Abdomen: Cholecystectomy. Musculoskeletal: Multiple chronic bilateral rib fractures. Stable mild anterior L1 compression deformity. No acute osseous abnormality identified. IMPRESSION: 1. Small right and moderate left pleural effusions. Mild interstitial pulmonary edema. 2. Partial consolidation of left lower lobe may represent atelectasis or pneumonia. 3. Large hiatal hernia. Patulous fluid-filled esophagus indicating reflux. 4. Right lobe of thyroid nodule measuring 18 mm, thyroid ultrasound recommended on a nonemergent basis. 5. Mild coronary artery calcification. Electronically Signed   By: Kristine Garbe M.D.   On: 10/05/2017 19:59   Ct Angio Chest Pe W Or Wo Contrast  Result Date: 09/10/2017 CLINICAL DATA:  Shortness of breath with severe abdominal pain. Recent paraesophageal hernia repair. EXAM: CT ANGIOGRAPHY CHEST CT ABDOMEN AND PELVIS WITH CONTRAST TECHNIQUE: Multidetector CT imaging of the  chest was performed using the standard protocol during bolus administration of intravenous contrast. Multiplanar CT image reconstructions and MIPs were obtained to evaluate the vascular anatomy. Multidetector CT imaging of the abdomen and pelvis was performed using the standard protocol during bolus administration of intravenous contrast. CONTRAST:  80 mL Isovue 370 IV. COMPARISON:  CT chest 05/08/2017 and CT abdomen/ pelvis 06/14/2017 FINDINGS: CTA CHEST FINDINGS Cardiovascular: Heart is normal size an compressed anteriorly by the large hiatal hernia and postoperative air and fluid over the mediastinum adjacent the distal esophagus and hiatal hernia. Minimal calcified plaque over the left anterior descending coronary artery. Thoracic aorta is within normal. No definite pulmonary emboli. Mediastinum/Nodes: No mediastinal or hilar adenopathy. There is a large collection over the mid to lower mediastinum including patient's large hiatal hernia containing contrast as well as a segment of  catheter tubing likely extending up from below from patient's percutaneous gastrostomy tube. There is moderate fluid and air around the hiatal hernia and esophagus over the mid to lower mediastinum as the prior exam demonstrated multiple additional bowel loops in this region. It is difficult to determine how much of this air-fluid is free air and fluid versus within bowel loops. No evidence of contrast extravasation. Lungs/Pleura: Examination demonstrates mild bilateral atelectatic change and small effusions. Review of the MIP images confirms the above findings. CT ABDOMEN and PELVIS FINDINGS Hepatobiliary: Previous cholecystectomy.  Liver is unremarkable. Pancreas: Pancreas is within normal. Spleen: Spleen is unremarkable. Adrenals/Urinary Tract: Adrenal glands and kidneys are within normal. Ureters and bladder are unremarkable. Stomach/Bowel: As described above as there is a persistent large hiatal hernia likely with additional bowel  loops within the mid to lower mediastinum. Cannot exclude free fluid and free air in the mid to lower mediastinum. No contrast extravasation. Percutaneous gastrostomy tube over the left upper quadrant appears in adequate position. Moderate free peritoneal air over the upper abdomen compatible recent surgery and gastrostomy tube placement. Patient had a previous partial colectomy. There are surgical clips over several small bowel loops. There are no definite dilated small bowel loops. Visualize colon is unremarkable. There is mild ascites likely from patient's recent surgery. Vascular/Lymphatic: Within normal. Reproductive: Within normal. Other: Postsurgical change over the anterior abdominal wall. Musculoskeletal: Degenerative change of the spine and hips. Stable L1 mild compression fracture. Tarlov cyst. Review of the MIP images confirms the above findings. IMPRESSION: No evidence of pulmonary embolism. Small bilateral pleural effusions with bilateral atelectasis. Persistent large hiatal hernia with large fluid and air collection within the mid to lower mediastinum some of which may be within bowel loops, although it is difficult to assess how much of this air and fluid is free and related to patient's surgery as well as gastrostomy tube placement. There is contrast within the esophagus and hiatal hernia without contrast extravasation. Gastrostomy tube in adequate position. Postsurgical changes over the upper abdomen with free air and ascites. No evidence of bowel obstruction. Evidence of previous partial colectomy. Stable L1 compression fracture. Electronically Signed   By: Marin Olp M.D.   On: 09/10/2017 00:34   Ct Abdomen Pelvis W Contrast  Result Date: 10/01/2017 CLINICAL DATA:  Fever. Status post multiple recent surgical procedures including repair of a large paraesophageal hiatal hernia complicated by peritonitis, takedown of gastrostomy, repair of gastrotomy, hepatorrhaphy and percutaneous catheter  drainage of pelvic abscess. EXAM: CT ABDOMEN AND PELVIS WITH CONTRAST TECHNIQUE: Multidetector CT imaging of the abdomen and pelvis was performed using the standard protocol following bolus administration of intravenous contrast. CONTRAST:  197mL ISOVUE-300 IOPAMIDOL (ISOVUE-300) INJECTION 61% COMPARISON:  09/24/2017 as well as multiple prior CT studies. FINDINGS: Lower chest: In the lower chest, there remains a moderate hiatal hernia with some residual small amount of complex fluid around the hernia. This appears improved compared to the prior study but is incompletely visualized. A mediastinal drain has been removed. There remains a moderate amount of pleural fluid at the left lung base. Given fever, there may be benefit to examining the entire chest for evidence of infection in the chest. Hepatobiliary: No focal liver abnormality is seen. Status post cholecystectomy. No biliary dilatation. Pancreas: Unremarkable. No pancreatic ductal dilatation or surrounding inflammatory changes. Spleen: Normal in size without focal abnormality. Adrenals/Urinary Tract: Adrenal glands are unremarkable. Kidneys are normal, without renal calculi, focal lesion, or hydronephrosis. Bladder is unremarkable. Stomach/Bowel: A few pelvic small  bowel loops are moderately dilated and fluid-filled, likely consistent with ileus. Other small bowel loops and the colon are decompressed and there is no evidence of a significant bowel obstruction. No evidence of free intraperitoneal air or new focal abscess. Vascular/Lymphatic: No significant vascular findings are present. No enlarged abdominal or pelvic lymph nodes. Reproductive: Uterus and bilateral adnexa are unremarkable. Other: Right transgluteal percutaneous drainage catheter remains in place. The cul de sac abscess has essentially resolved with small residual amount of fluid remaining adjacent to the drainage catheter. Musculoskeletal: No acute or significant osseous findings. IMPRESSION:  1. Less prominent complex fluid surrounding residual hiatal hernia in the lower chest. This is incompletely visualized, however and there remains a moderate left pleural effusion. Given no significant new findings in the abdomen or pelvis, there may be benefit in obtaining a CT of the chest with contrast to examine the entire chest. 2. Some degree of ileus involving distal small bowel without evidence of significant bowel obstruction. No evidence of bowel perforation or new focal abscess. 3. Nearly completely decompressed and resolved pelvic abscess at the level of the cul de sac. Only a small amount of fluid remains adjacent to the indwelling pelvic drainage catheter. Electronically Signed   By: Aletta Edouard M.D.   On: 10/01/2017 13:00   Ct Abdomen Pelvis W Contrast  Result Date: 09/24/2017 CLINICAL DATA:  Status post pelvic abscess drainage. EXAM: CT ABDOMEN AND PELVIS WITH CONTRAST TECHNIQUE: Multidetector CT imaging of the abdomen and pelvis was performed using the standard protocol following bolus administration of intravenous contrast. CONTRAST:  152mL ISOVUE-300 IOPAMIDOL (ISOVUE-300) INJECTION 61% COMPARISON:  CT scan of September 16, 2017. FINDINGS: Lower chest: Moderate bilateral pleural effusions are noted with adjacent atelectasis. There is again noted large hiatal hernia. Crescent shaped fluid collection is again noted around the hernia. One surgically placed drainage catheter has been removed from this area. The remaining surgically placed drainage catheter is unchanged in position, but the fluid is distal to the distal end of the catheter. Hepatobiliary: Status post cholecystectomy. 3.1 x 2.9 cm fluid collection remains inferior to falciform ligament. No focal abnormality is seen within the liver. Pancreas: Unremarkable. No pancreatic ductal dilatation or surrounding inflammatory changes. Spleen: Normal in size without focal abnormality. Adrenals/Urinary Tract: Adrenal glands are unremarkable.  Kidneys are normal, without renal calculi, focal lesion, or hydronephrosis. Bladder is unremarkable. Stomach/Bowel: There is no evidence of bowel obstruction. Mild wall thickening of cecum and transverse colon is noted which may represent mild inflammation. Status post appendectomy. Vascular/Lymphatic: No significant vascular findings are present. No enlarged abdominal or pelvic lymph nodes. Reproductive: Uterus and bilateral adnexa are unremarkable. Other: There is been interval placement of right trans gluteal percutaneous drainage catheter. Fluid collection noted in pre rectal space on prior exam is significantly smaller. Only a small amount of residual fluid remains, measuring 6.3 x 1.8 cm. Musculoskeletal: No acute or significant osseous findings. IMPRESSION: Moderate bilateral pleural effusions are noted with adjacent atelectasis. Large hiatal hernia is again noted. Crescent-shaped fluid collection is again noted around the hernia which is not significantly changed. One of the surgically placed drains seen within this has been removed in the interval. The remaining drain does not appear to be positioned within the fluid collection. 3 cm fluid collection is seen inferior to falciform ligament which is slightly smaller compared to prior exam. Interval placement of right trans gluteal percutaneous drainage catheter into pre rectal abscess or fluid collection. This fluid collection is significantly smaller compared to prior  exam, with only a small amount of residual fluid remaining. Electronically Signed   By: Marijo Conception, M.D.   On: 09/24/2017 14:50   Ct Abdomen Pelvis W Contrast  Result Date: 09/16/2017 CLINICAL DATA:  admitted for surgical intervention of hiatal hernia. She had laparoscopic gastropexy, lysis of adhesions, and G tube placement. Developed septic shock and VDRF post op from pneumoperitoneum with purulent peritonitis with concern for G tube leak.HX crohns, hiatal hernia, bowel resection,  appendectomy, cholecystectomy EXAM: CT ABDOMEN AND PELVIS WITH CONTRAST TECHNIQUE: Multidetector CT imaging of the abdomen and pelvis was performed using the standard protocol following bolus administration of intravenous contrast. CONTRAST:  16mL ISOVUE-300 IOPAMIDOL (ISOVUE-300) INJECTION 61% COMPARISON:  09/09/2017 FINDINGS: Lower chest: Moderate bilateral pleural effusions. Extensive atelectasis/ consolidation in the dependent aspect of both lower lobes. Right subclavian central line to the cavoatrial junction. Large hiatal hernia containing gastric fundus and a large amount of mesenteric fat, with surrounding fluid which may be ascites or hemorrhage. Nasogastric tube in place. Two surgical drains from the abdomen extend into the hiatal hernia region. Hepatobiliary: Liver unremarkable. Cholecystectomy clips. 4.4 x2.5 cm fluid collection at inferior aspect of the falciform ligament, without surrounding enhancement. No intraparenchymal liver lesion. No biliary ductal dilatation. Pancreas: Unremarkable. No pancreatic ductal dilatation or surrounding inflammatory changes. Spleen: Normal in size without focal abnormality. Adrenals/Urinary Tract: Normal adrenals. Kidneys unremarkable. Foley catheter decompresses the urinary bladder. Stomach/Bowel: Interval removal of gastrostomy tube. Stomach is decompressed, the fundus extending into the hiatal hernia as above. Proximal small bowel decompressed. There is some distended fluid-filled loops of distal small bowel long-term including terminal ileum showing circumferential mild wall thickening. There is contiguous circumferential wall thickening in the proximal ascending colon, also segmentally in the splenic flexure of the colon. There is moderate inflammatory/edematous change around the splenic flexure without abscess. Colon is nondilated. Rectal tube in place. Vascular/Lymphatic: No significant vascular findings are present. No enlarged abdominal or pelvic lymph nodes.  Reproductive: Status post hysterectomy. No adnexal masses. Other: Loculated peripherally enhancing fluid collection in the cul-de-sac measured approximately 8.7 x 2.1 cm maximum transverse dimensions. There is scattered fluid among leaves of the mesentery. No free air. Musculoskeletal: Stable mild L1 compression deformity. No acute fracture or worrisome bone lesion. 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 IMPRESSION: 1. Complex postop fluid collection in the large hiatal hernia may represent ascites and edematous mesentery versus hemorrhage. 2. 8.7 cm peripherally enhancing cul-de-sac fluid collection, possible abscess. 3. Circumferential wall thickening in distal ileum, proximal ascending colon, and splenic flexure with surrounding inflammatory/edematous changes consistent with colitis. 4. Increase in bilateral pleural effusions. Electronically Signed   By: Lucrezia Europe M.D.   On: 09/16/2017 19:36   Ct Abdomen Pelvis W Contrast  Result Date: 09/10/2017 CLINICAL DATA:  Shortness of breath with severe abdominal pain. Recent paraesophageal hernia repair. EXAM: CT ANGIOGRAPHY CHEST CT ABDOMEN AND PELVIS WITH CONTRAST TECHNIQUE: Multidetector CT imaging of the chest was performed using the standard protocol during bolus administration of intravenous contrast. Multiplanar CT image reconstructions and MIPs were obtained to evaluate the vascular anatomy. Multidetector CT imaging of the abdomen and pelvis was performed using the standard protocol during bolus administration of intravenous contrast. CONTRAST:  80 mL Isovue 370 IV. COMPARISON:  CT chest 05/08/2017 and CT abdomen/ pelvis 06/14/2017 FINDINGS: CTA CHEST FINDINGS Cardiovascular: Heart is normal size an compressed anteriorly by the large hiatal hernia and postoperative air and fluid over the mediastinum adjacent the distal esophagus and hiatal hernia. Minimal  calcified plaque over the left anterior descending coronary artery. Thoracic aorta is within  normal. No definite pulmonary emboli. Mediastinum/Nodes: No mediastinal or hilar adenopathy. There is a large collection over the mid to lower mediastinum including patient's large hiatal hernia containing contrast as well as a segment of catheter tubing likely extending up from below from patient's percutaneous gastrostomy tube. There is moderate fluid and air around the hiatal hernia and esophagus over the mid to lower mediastinum as the prior exam demonstrated multiple additional bowel loops in this region. It is difficult to determine how much of this air-fluid is free air and fluid versus within bowel loops. No evidence of contrast extravasation. Lungs/Pleura: Examination demonstrates mild bilateral atelectatic change and small effusions. Review of the MIP images confirms the above findings. CT ABDOMEN and PELVIS FINDINGS Hepatobiliary: Previous cholecystectomy.  Liver is unremarkable. Pancreas: Pancreas is within normal. Spleen: Spleen is unremarkable. Adrenals/Urinary Tract: Adrenal glands and kidneys are within normal. Ureters and bladder are unremarkable. Stomach/Bowel: As described above as there is a persistent large hiatal hernia likely with additional bowel loops within the mid to lower mediastinum. Cannot exclude free fluid and free air in the mid to lower mediastinum. No contrast extravasation. Percutaneous gastrostomy tube over the left upper quadrant appears in adequate position. Moderate free peritoneal air over the upper abdomen compatible recent surgery and gastrostomy tube placement. Patient had a previous partial colectomy. There are surgical clips over several small bowel loops. There are no definite dilated small bowel loops. Visualize colon is unremarkable. There is mild ascites likely from patient's recent surgery. Vascular/Lymphatic: Within normal. Reproductive: Within normal. Other: Postsurgical change over the anterior abdominal wall. Musculoskeletal: Degenerative change of the spine and  hips. Stable L1 mild compression fracture. Tarlov cyst. Review of the MIP images confirms the above findings. IMPRESSION: No evidence of pulmonary embolism. Small bilateral pleural effusions with bilateral atelectasis. Persistent large hiatal hernia with large fluid and air collection within the mid to lower mediastinum some of which may be within bowel loops, although it is difficult to assess how much of this air and fluid is free and related to patient's surgery as well as gastrostomy tube placement. There is contrast within the esophagus and hiatal hernia without contrast extravasation. Gastrostomy tube in adequate position. Postsurgical changes over the upper abdomen with free air and ascites. No evidence of bowel obstruction. Evidence of previous partial colectomy. Stable L1 compression fracture. Electronically Signed   By: Marin Olp M.D.   On: 09/10/2017 00:34   Dg Chest Port 1 View  Result Date: 10/04/2017 CLINICAL DATA:  Bedside PICC placement. EXAM: PORTABLE CHEST 1 VIEW COMPARISON:  10/01/2017, 09/14/2017 and earlier. FINDINGS: New left arm PICC courses up into the left internal jugular vein in the tip is not included. The right arm PICC has been removed in the interval. Stable moderate cardiac enlargement. Hiatal hernia as noted previously. Interval decrease in size of the left pleural effusion which is still moderate to large. Stable dense consolidation in the left lower lobe. Improved aeration in the right lung base with mild atelectasis persisting. Mild pulmonary venous hypertension without evidence of pulmonary edema currently. No new pulmonary parenchymal abnormalities. IMPRESSION: 1. Left arm PICC tip courses up into the left internal jugular vein. The catheter should be withdrawn and redirected. 2. Decrease in size of the still moderate to large left pleural effusion since the examination 3 days ago. 3. Stable dense left lower lobe pneumonia and/or atelectasis. 4. Improved aeration in the  right  lung base with mild atelectasis persisting. 5. No new abnormalities in the lungs. These results will be called to the ordering clinician or representative by the Radiologist Assistant, and communication documented in the PACS or zVision Dashboard. Electronically Signed   By: Evangeline Dakin M.D.   On: 10/04/2017 10:16   Dg Chest Port 1 View  Result Date: 10/01/2017 CLINICAL DATA:  Fever today. Recent abdominal surgery. Nausea and vomiting. EXAM: PORTABLE CHEST 1 VIEW COMPARISON:  09/14/2017 and 09/11/2017 radiographs. FINDINGS: 1223 hours. Two views obtained. The nasogastric tube has been removed. The right arm PICC appears unchanged with the tip at the mid right atrial level. There is stable cardiomegaly. Patient has a known hiatal hernia. Left pleural effusion and left basilar opacity have increased. Otherwise, there is improved aeration of the lungs without definite residual edema. There is no pneumothorax. The bones appear unchanged. IMPRESSION: Interval resolution of pulmonary edema. Worsening left pleural effusion and left basilar opacity, likely atelectasis. Electronically Signed   By: Richardean Sale M.D.   On: 10/01/2017 12:46   Dg Chest Port 1 View  Result Date: 09/14/2017 CLINICAL DATA:  Respiratory failure. EXAM: PORTABLE CHEST 1 VIEW COMPARISON:  09/11/2017. FINDINGS: Interim extubation. NG tube tip in the upper portion of the stomach. Right PICC line in unchanged position with tip in right atrium. Cardiomegaly. Diffuse bilateral pulmonary infiltrates consistent pulmonary edema noted on today's exam. Bilateral pleural effusions. Findings consistent CHF. No pneumothorax. IMPRESSION: 1. Interim extubation. NG tube tip noted in the upper portion stomach. Right PICC line tip noted in unchanged position in the right atrium. 2. Cardiomegaly with diffuse pulmonary infiltrates consistent with pulmonary edema noted on today's exam. Bilateral pleural effusions. Electronically Signed   By: Marcello Moores   Register   On: 09/14/2017 06:13   Portable Chest Xray  Result Date: 09/11/2017 CLINICAL DATA:  Acute respiratory failure.  Ventilator support. EXAM: PORTABLE CHEST 1 VIEW COMPARISON:  09/10/2017.  09/09/2017. FINDINGS: Endotracheal tube is 1 cm above the carina. Nasogastric tube enters the abdomen. Right arm PICC tip is in the right atrium. Surgical drain tubes overlie the chest and abdomen. Persistent atelectasis/infiltrate in both lower lobes. IMPRESSION: Lines and tubes well positioned. Mild lower lobe atelectasis/infiltrate following thoracic and abdominal surgery. Electronically Signed   By: Nelson Chimes M.D.   On: 09/11/2017 06:57   Dg Chest Portable 1 View  Result Date: 09/10/2017 CLINICAL DATA:  Central line placement. EXAM: PORTABLE CHEST 1 VIEW COMPARISON:  CT chest 09/09/2017.  One-view chest x-ray 09/09/2017. FINDINGS: Heart is enlarged. Large hiatal hernia is again noted. The patient has been intubated. The endotracheal tube terminates less than 1 cm from the carina and could be pulled back to 3 cm for more optimal positioning. A left IJ line terminates above the aorta. Location is indeterminate. There is no pneumothorax. An NG tube is in place with decompression of the stomach and large hiatal hernia. Bilateral pleural effusions and basilar airspace disease are again noted. Pulmonary vascular congestion has increased. IMPRESSION: 1. Vascular catheter within the left-sided neck terminates above the aorta. Position is indeterminate. 2. Endotracheal tube terminates less than 1 cm from the carina. 3. Interval decompression of stomach enlarged hiatal hernia following placement of NG tube. 4. Bilateral pleural effusions and associated airspace disease, likely atelectasis. These results were called by telephone at the time of interpretation on 09/10/2017 at 12:58 Pm to Dr. Romana Juniper , who verbally acknowledged these results. Electronically Signed   By: San Morelle M.D.   On:  09/10/2017  13:04   Dg Chest Port 1 View  Result Date: 09/09/2017 CLINICAL DATA:  Follow-up shortness of breath history of a hiatal hernia. EXAM: PORTABLE CHEST 1 VIEW COMPARISON:  Chest x-ray of June 12, 2017 FINDINGS: There is marked dilation of a large diaphragmatic hernia containing gas-filled stomach and possibly bowel. A tubular structure overlies this region which could reflect a G-tube in the appropriate setting. There is bibasilar atelectasis adjacent to the hiatal hernia- intrathoracic stomach. There is no pleural effusion. The cardiac silhouette is enlarged. The pulmonary vascularity is not engorged. IMPRESSION: Subsegmental atelectasis adjacent to the large diaphragmatic hernia. There is gaseous distention of the stomach. There may be bowel loops above the hemidiaphragm as well. No discrete pneumonia. No CHF. Electronically Signed   By: David  Martinique M.D.   On: 09/09/2017 08:45   Dg Abd Portable 1v  Result Date: 09/14/2017 CLINICAL DATA:  Ileus EXAM: PORTABLE ABDOMEN - 1 VIEW COMPARISON:  09/09/2017 FINDINGS: NG tube tip projects over the mid stomach. Nonobstructive bowel gas pattern. No free air organomegaly. IMPRESSION: No evidence of bowel obstruction or ileus. Electronically Signed   By: Rolm Baptise M.D.   On: 09/14/2017 09:32   Dg Abd Portable 1v  Result Date: 09/09/2017 CLINICAL DATA:  Abdominal pain.  Hiatal hernia repair. EXAM: PORTABLE ABDOMEN - 1 VIEW COMPARISON:  07/28/2017. FINDINGS: Nonobstructive gas pattern. Gastrostomy tube LEFT upper quadrant. No worrisome osseous findings. IMPRESSION: As above. Electronically Signed   By: Staci Righter M.D.   On: 09/09/2017 18:37   Ct Image Guided Drainage By Percutaneous Catheter  Result Date: 09/18/2017 CLINICAL DATA:  Status post abdominal surgery with development of peritonitis. Worsening leukocytosis with cul de sac pelvic abscess. EXAM: CT GUIDED CATHETER DRAINAGE OF PELVIC PERITONEAL ABSCESS ANESTHESIA/SEDATION: 3.0 mg IV Versed 100 mcg IV  Fentanyl Total Moderate Sedation Time:  11 minutes The patient's level of consciousness and physiologic status were continuously monitored during the procedure by Radiology nursing. PROCEDURE: The procedure, risks, benefits, and alternatives were explained to the patient. Questions regarding the procedure were encouraged and answered. The patient understands and consents to the procedure. A time out was performed prior to initiating the procedure. The right transgluteal region was prepped with chlorhexidine in a sterile fashion, and a sterile drape was applied covering the operative field. A sterile gown and sterile gloves were used for the procedure. Local anesthesia was provided with 1% Lidocaine. The patient was placed in a near decubitus position with the right side up. From a posterior transgluteal approach, an 18 gauge trocar needle was advanced to the level of a pelvic abscess situated posterior to the bladder and anterior to the rectum. After needle entry, aspiration was performed and a fluid sample sent for culture analysis. A guidewire was advanced into the collection. The tract was dilated and a 10 French percutaneous drainage catheter placed. Catheter position was confirmed by CT. The catheter was flushed and connected to a suction bulb. The catheter was secured at the skin with a Prolene retention suture and StatLock device. COMPLICATIONS: None FINDINGS: Aspiration at the level of the pelvic fluid collection yielded bloody and slightly turbid fluid. A sample was sent for culture analysis. After drain placement, there is excellent return of fluid. IMPRESSION: CT-guided percutaneous catheter drainage of pelvic abscess from a posterior right transgluteal approach. There was return of bloody and turbid fluid with a sample sent for culture analysis. A 10 French drain was placed and attached to suction bulb drainage. Electronically Signed  By: Aletta Edouard M.D.   On: 09/18/2017 08:39   Dg Esophagus  W/water Sol Cm  Result Date: 09/09/2017 CLINICAL DATA:  Hiatal hernia. Recent gastrostomy tube placement. "Rule out leak." EXAM: ESOPHOGRAM/BARIUM SWALLOW TECHNIQUE: Single contrast examination was performed using water-soluble contrast. FLUOROSCOPY TIME:  Fluoroscopy Time:  1.0 minutes Radiation Exposure Index (if provided by the fluoroscopic device): 10.4 Number of Acquired Spot Images: 0 COMPARISON:  06/10/2017 and chest CT from 05/08/2017 FINDINGS: The patient tolerated taking 3 small sips of water soluble contrast, a total of about 20 cc. After that she refused all further imaging. She would only allow imaging in the supine position hunched forward about 40 degrees by wedge pillows. On the swallows, the esophagus is noted to deviate laterally to the right before emptying into the very large intrathoracic hiatal hernia at projects over the central chest partially due to the patient positioning. There is some degree of esophageal dysmotility, as well as distal esophageal irregularity. Contrast surrounds the distal esophagus but is probably still within the very large hiatal hernia, which is distended with gas. I tilted the table up about 6 or 7 degrees, which is the most that the patient could tolerate without starting to slide. I was not able to get contrast to empty down into the intra- abdominal portion of the stomach. I am able to see a filling defect in the gastric bubble intra-abdominal portion compatible with the G-tube retention mechanism. IMPRESSION: 1. Today's exam does not exclude leak from the esophagus or stomach. The exam is simply too limited and the patient is not a suitable candidate for fluoroscopic imaging of this type. Esophagram requires the patient's ability to turn a different angles and swallow more than a sip or two of contrast, and this patient's required positioning today, sitting up about 40 degrees with respect to the plane of imaging, is particularly unsuitable to fluoroscopic  diagnosis. 2. From what I can tell on the limited assessment achievable, the esophagus is bunched over to the right and empties into a very large hiatal hernia containing at least most of the stomach. The contrast fills along the posterior wall of the hiatal hernia. The esophagus is irregular distally, and ulceration is not excluded. Chest CT might be an alternative although I am not certain that the patient will tolerate laying down for chest CT. Electronically Signed   By: Van Clines M.D.   On: 09/09/2017 11:25    Microbiology: Recent Results (from the past 240 hour(s))  Blood Culture (routine x 2)     Status: None   Collection Time: 10/01/17 11:27 AM  Result Value Ref Range Status   Specimen Description BLOOD RIGHT ARM  Final   Special Requests IN PEDIATRIC BOTTLE Blood Culture adequate volume  Final   Culture   Final    NO GROWTH 5 DAYS Performed at [facility], [address] [place], [place] [address]    Report Status 10/06/2017 FINAL  Final  Blood Culture (routine x 2)     Status: None   Collection Time: 10/01/17  6:55 PM  Result Value Ref Range Status   Specimen Description BLOOD LEFT HAND  Final   Special Requests IN PEDIATRIC BOTTLE Blood Culture adequate volume  Final   Culture   Final    NO GROWTH 5 DAYS Performed at [facility], [address], [place], [place] [address]    Report Status 10/06/2017 FINAL  Final  MRSA PCR Screening  Status: None   Collection Time: 10/04/17 11:03 AM  Result Value Ref Range Status   MRSA by PCR NEGATIVE NEGATIVE Final    Comment:        The GeneXpert MRSA Assay (FDA approved for NASAL specimens only), is one component of a comprehensive MRSA colonization surveillance program. It is not intended to diagnose MRSA infection nor to guide or monitor treatment for MRSA infections.      Labs: Basic Metabolic Panel:  Recent Labs Lab 10/01/17 1127 10/02/17 0645 10/03/17 0454 10/04/17 0455 10/05/17 0454  10/06/17 0444  NA 141  --  144 146* 145 145  K 3.6  --  3.3* 3.4* 3.3* 3.3*  CL 115*  --  119* 121* 121* 120*  CO2 13*  --  15* 16* 14* 15*  GLUCOSE 69  --  82 86 77 77  BUN 9  --  11 12 9 8   CREATININE 1.15* 1.21* 1.22* 1.21* 1.17* 1.21*  CALCIUM 8.6*  --  8.8* 8.5* 8.2* 8.3*  MG  --   --   --   --   --  1.5*   Liver Function Tests:  Recent Labs Lab 10/01/17 1127  AST 17  ALT 9*  ALKPHOS 120  BILITOT 1.1  PROT 8.4*  ALBUMIN 2.6*   No results for input(s): LIPASE, AMYLASE in the last 168 hours. No results for input(s): AMMONIA in the last 168 hours. CBC:  Recent Labs Lab 10/01/17 1127 10/03/17 0454 10/04/17 0455 10/05/17 0454 10/06/17 0444  WBC 28.0* 24.5* 26.2* 24.4* 23.1*  NEUTROABS 19.1*  --   --   --   --   HGB 9.1* 8.9* 8.5* 8.8* 8.6*  HCT 27.7* 27.5* 26.3* 27.6* 26.4*  MCV 86.0 86.5 85.9 86.3 86.0  PLT 461* 371 359 368 369   Cardiac Enzymes:  Recent Labs Lab 10/04/17 1752  TROPONINI <0.03   BNP: BNP (last 3 results)  Recent Labs  05/07/17 0024  BNP 49.3    ProBNP (last 3 results) No results for input(s): PROBNP in the last 8760 hours.  CBG: No results for input(s): GLUCAP in the last 168 hours.     Signed:  Barton Dubois MD.  [facility] 10/07/2017, 8:37 AM

## 2017-10-07 NOTE — Progress Notes (Signed)
PT Cancellation Note  Patient Details Name: Danielle [name] MRN: [id number] DOB: Nov 13, 1946   Cancelled Treatment:    Reason Eval/Treat Not Completed: Medical issues which prohibited therapy (pt declined, stated she's dizzy from throwing up earlier this morning. Stated she's been able to walk in her room during hospitalization. Will follow. )   Philomena Doheny 10/07/2017, 10:39 AM  [phone]

## 2017-10-12 DIAGNOSIS — T8143XA Infection following a procedure, organ and space surgical site, initial encounter: Secondary | ICD-10-CM | POA: Diagnosis not present

## 2017-10-12 DIAGNOSIS — L409 Psoriasis, unspecified: Secondary | ICD-10-CM | POA: Diagnosis not present

## 2017-10-12 DIAGNOSIS — R32 Unspecified urinary incontinence: Secondary | ICD-10-CM | POA: Diagnosis not present

## 2017-10-12 DIAGNOSIS — K219 Gastro-esophageal reflux disease without esophagitis: Secondary | ICD-10-CM | POA: Diagnosis not present

## 2017-10-12 DIAGNOSIS — Z792 Long term (current) use of antibiotics: Secondary | ICD-10-CM | POA: Diagnosis not present

## 2017-10-12 DIAGNOSIS — K50919 Crohn's disease, unspecified, with unspecified complications: Secondary | ICD-10-CM | POA: Diagnosis not present

## 2017-10-12 DIAGNOSIS — Z452 Encounter for adjustment and management of vascular access device: Secondary | ICD-10-CM | POA: Diagnosis not present

## 2017-10-12 DIAGNOSIS — N183 Chronic kidney disease, stage 3 (moderate): Secondary | ICD-10-CM | POA: Diagnosis not present

## 2017-10-12 DIAGNOSIS — D631 Anemia in chronic kidney disease: Secondary | ICD-10-CM | POA: Diagnosis not present

## 2017-10-12 DIAGNOSIS — K5000 Crohn's disease of small intestine without complications: Secondary | ICD-10-CM | POA: Diagnosis not present

## 2017-10-12 DIAGNOSIS — E43 Unspecified severe protein-calorie malnutrition: Secondary | ICD-10-CM | POA: Diagnosis not present

## 2017-10-12 DIAGNOSIS — K580 Irritable bowel syndrome with diarrhea: Secondary | ICD-10-CM | POA: Diagnosis not present

## 2017-10-13 DIAGNOSIS — K219 Gastro-esophageal reflux disease without esophagitis: Secondary | ICD-10-CM | POA: Diagnosis not present

## 2017-10-13 DIAGNOSIS — N183 Chronic kidney disease, stage 3 (moderate): Secondary | ICD-10-CM | POA: Diagnosis not present

## 2017-10-13 DIAGNOSIS — L409 Psoriasis, unspecified: Secondary | ICD-10-CM | POA: Diagnosis not present

## 2017-10-13 DIAGNOSIS — Z452 Encounter for adjustment and management of vascular access device: Secondary | ICD-10-CM | POA: Diagnosis not present

## 2017-10-13 DIAGNOSIS — Z792 Long term (current) use of antibiotics: Secondary | ICD-10-CM | POA: Diagnosis not present

## 2017-10-13 DIAGNOSIS — E43 Unspecified severe protein-calorie malnutrition: Secondary | ICD-10-CM | POA: Diagnosis not present

## 2017-10-13 DIAGNOSIS — D631 Anemia in chronic kidney disease: Secondary | ICD-10-CM | POA: Diagnosis not present

## 2017-10-13 DIAGNOSIS — T8143XA Infection following a procedure, organ and space surgical site, initial encounter: Secondary | ICD-10-CM | POA: Diagnosis not present

## 2017-10-13 DIAGNOSIS — K5000 Crohn's disease of small intestine without complications: Secondary | ICD-10-CM | POA: Diagnosis not present

## 2017-10-14 ENCOUNTER — Emergency Department (HOSPITAL_COMMUNITY): Payer: Medicare HMO

## 2017-10-14 ENCOUNTER — Encounter (HOSPITAL_COMMUNITY): Payer: Self-pay | Admitting: Emergency Medicine

## 2017-10-14 ENCOUNTER — Inpatient Hospital Stay (HOSPITAL_COMMUNITY)
Admission: EM | Admit: 2017-10-14 | Discharge: 2017-11-06 | DRG: 853 | Disposition: E | Payer: Medicare HMO | Attending: Pulmonary Disease | Admitting: Pulmonary Disease

## 2017-10-14 ENCOUNTER — Other Ambulatory Visit: Payer: Self-pay

## 2017-10-14 DIAGNOSIS — N183 Chronic kidney disease, stage 3 unspecified: Secondary | ICD-10-CM | POA: Diagnosis present

## 2017-10-14 DIAGNOSIS — J948 Other specified pleural conditions: Secondary | ICD-10-CM | POA: Diagnosis not present

## 2017-10-14 DIAGNOSIS — R05 Cough: Secondary | ICD-10-CM | POA: Diagnosis not present

## 2017-10-14 DIAGNOSIS — E43 Unspecified severe protein-calorie malnutrition: Secondary | ICD-10-CM | POA: Diagnosis present

## 2017-10-14 DIAGNOSIS — K449 Diaphragmatic hernia without obstruction or gangrene: Secondary | ICD-10-CM

## 2017-10-14 DIAGNOSIS — K920 Hematemesis: Secondary | ICD-10-CM | POA: Diagnosis not present

## 2017-10-14 DIAGNOSIS — L0291 Cutaneous abscess, unspecified: Secondary | ICD-10-CM

## 2017-10-14 DIAGNOSIS — N179 Acute kidney failure, unspecified: Secondary | ICD-10-CM | POA: Diagnosis not present

## 2017-10-14 DIAGNOSIS — B999 Unspecified infectious disease: Secondary | ICD-10-CM

## 2017-10-14 DIAGNOSIS — J9811 Atelectasis: Secondary | ICD-10-CM | POA: Diagnosis present

## 2017-10-14 DIAGNOSIS — K610 Anal abscess: Secondary | ICD-10-CM | POA: Diagnosis present

## 2017-10-14 DIAGNOSIS — J690 Pneumonitis due to inhalation of food and vomit: Secondary | ICD-10-CM | POA: Diagnosis not present

## 2017-10-14 DIAGNOSIS — K50113 Crohn's disease of large intestine with fistula: Secondary | ICD-10-CM | POA: Diagnosis present

## 2017-10-14 DIAGNOSIS — I272 Pulmonary hypertension, unspecified: Secondary | ICD-10-CM | POA: Diagnosis not present

## 2017-10-14 DIAGNOSIS — R918 Other nonspecific abnormal finding of lung field: Secondary | ICD-10-CM | POA: Diagnosis not present

## 2017-10-14 DIAGNOSIS — I82A13 Acute embolism and thrombosis of axillary vein, bilateral: Secondary | ICD-10-CM | POA: Diagnosis not present

## 2017-10-14 DIAGNOSIS — I82621 Acute embolism and thrombosis of deep veins of right upper extremity: Secondary | ICD-10-CM | POA: Diagnosis not present

## 2017-10-14 DIAGNOSIS — J80 Acute respiratory distress syndrome: Secondary | ICD-10-CM

## 2017-10-14 DIAGNOSIS — R6520 Severe sepsis without septic shock: Secondary | ICD-10-CM | POA: Diagnosis not present

## 2017-10-14 DIAGNOSIS — Z8719 Personal history of other diseases of the digestive system: Secondary | ICD-10-CM | POA: Diagnosis not present

## 2017-10-14 DIAGNOSIS — R109 Unspecified abdominal pain: Secondary | ICD-10-CM | POA: Diagnosis not present

## 2017-10-14 DIAGNOSIS — R1312 Dysphagia, oropharyngeal phase: Secondary | ICD-10-CM | POA: Diagnosis not present

## 2017-10-14 DIAGNOSIS — Z91048 Other nonmedicinal substance allergy status: Secondary | ICD-10-CM

## 2017-10-14 DIAGNOSIS — I82A12 Acute embolism and thrombosis of left axillary vein: Secondary | ICD-10-CM | POA: Diagnosis not present

## 2017-10-14 DIAGNOSIS — Z7901 Long term (current) use of anticoagulants: Secondary | ICD-10-CM

## 2017-10-14 DIAGNOSIS — R45851 Suicidal ideations: Secondary | ICD-10-CM | POA: Diagnosis not present

## 2017-10-14 DIAGNOSIS — J9859 Other diseases of mediastinum, not elsewhere classified: Secondary | ICD-10-CM | POA: Diagnosis not present

## 2017-10-14 DIAGNOSIS — Z09 Encounter for follow-up examination after completed treatment for conditions other than malignant neoplasm: Secondary | ICD-10-CM

## 2017-10-14 DIAGNOSIS — I959 Hypotension, unspecified: Secondary | ICD-10-CM | POA: Diagnosis present

## 2017-10-14 DIAGNOSIS — R6521 Severe sepsis with septic shock: Secondary | ICD-10-CM | POA: Diagnosis not present

## 2017-10-14 DIAGNOSIS — R404 Transient alteration of awareness: Secondary | ICD-10-CM | POA: Diagnosis not present

## 2017-10-14 DIAGNOSIS — K567 Ileus, unspecified: Secondary | ICD-10-CM | POA: Diagnosis not present

## 2017-10-14 DIAGNOSIS — J853 Abscess of mediastinum: Secondary | ICD-10-CM | POA: Diagnosis not present

## 2017-10-14 DIAGNOSIS — N390 Urinary tract infection, site not specified: Secondary | ICD-10-CM | POA: Diagnosis not present

## 2017-10-14 DIAGNOSIS — N189 Chronic kidney disease, unspecified: Secondary | ICD-10-CM | POA: Diagnosis not present

## 2017-10-14 DIAGNOSIS — R509 Fever, unspecified: Secondary | ICD-10-CM

## 2017-10-14 DIAGNOSIS — J90 Pleural effusion, not elsewhere classified: Secondary | ICD-10-CM

## 2017-10-14 DIAGNOSIS — I5032 Chronic diastolic (congestive) heart failure: Secondary | ICD-10-CM | POA: Diagnosis present

## 2017-10-14 DIAGNOSIS — E872 Acidosis: Secondary | ICD-10-CM | POA: Diagnosis not present

## 2017-10-14 DIAGNOSIS — R0602 Shortness of breath: Secondary | ICD-10-CM | POA: Diagnosis not present

## 2017-10-14 DIAGNOSIS — R188 Other ascites: Secondary | ICD-10-CM | POA: Diagnosis present

## 2017-10-14 DIAGNOSIS — R634 Abnormal weight loss: Secondary | ICD-10-CM | POA: Diagnosis not present

## 2017-10-14 DIAGNOSIS — I10 Essential (primary) hypertension: Secondary | ICD-10-CM | POA: Diagnosis not present

## 2017-10-14 DIAGNOSIS — E860 Dehydration: Secondary | ICD-10-CM

## 2017-10-14 DIAGNOSIS — B49 Unspecified mycosis: Secondary | ICD-10-CM | POA: Diagnosis not present

## 2017-10-14 DIAGNOSIS — J9600 Acute respiratory failure, unspecified whether with hypoxia or hypercapnia: Secondary | ICD-10-CM

## 2017-10-14 DIAGNOSIS — E440 Moderate protein-calorie malnutrition: Secondary | ICD-10-CM | POA: Diagnosis present

## 2017-10-14 DIAGNOSIS — Z881 Allergy status to other antibiotic agents status: Secondary | ICD-10-CM

## 2017-10-14 DIAGNOSIS — K440 Diaphragmatic hernia with obstruction, without gangrene: Secondary | ICD-10-CM | POA: Diagnosis not present

## 2017-10-14 DIAGNOSIS — Z86718 Personal history of other venous thrombosis and embolism: Secondary | ICD-10-CM

## 2017-10-14 DIAGNOSIS — J9601 Acute respiratory failure with hypoxia: Secondary | ICD-10-CM | POA: Diagnosis not present

## 2017-10-14 DIAGNOSIS — D899 Disorder involving the immune mechanism, unspecified: Secondary | ICD-10-CM

## 2017-10-14 DIAGNOSIS — H547 Unspecified visual loss: Secondary | ICD-10-CM | POA: Diagnosis present

## 2017-10-14 DIAGNOSIS — I82409 Acute embolism and thrombosis of unspecified deep veins of unspecified lower extremity: Secondary | ICD-10-CM | POA: Diagnosis present

## 2017-10-14 DIAGNOSIS — Z9689 Presence of other specified functional implants: Secondary | ICD-10-CM | POA: Diagnosis not present

## 2017-10-14 DIAGNOSIS — J189 Pneumonia, unspecified organism: Secondary | ICD-10-CM | POA: Diagnosis not present

## 2017-10-14 DIAGNOSIS — Z9889 Other specified postprocedural states: Secondary | ICD-10-CM | POA: Diagnosis not present

## 2017-10-14 DIAGNOSIS — K651 Peritoneal abscess: Secondary | ICD-10-CM | POA: Diagnosis not present

## 2017-10-14 DIAGNOSIS — Z9049 Acquired absence of other specified parts of digestive tract: Secondary | ICD-10-CM

## 2017-10-14 DIAGNOSIS — Z886 Allergy status to analgesic agent status: Secondary | ICD-10-CM

## 2017-10-14 DIAGNOSIS — B377 Candidal sepsis: Principal | ICD-10-CM | POA: Diagnosis present

## 2017-10-14 DIAGNOSIS — E46 Unspecified protein-calorie malnutrition: Secondary | ICD-10-CM | POA: Diagnosis not present

## 2017-10-14 DIAGNOSIS — D72828 Other elevated white blood cell count: Secondary | ICD-10-CM

## 2017-10-14 DIAGNOSIS — R091 Pleurisy: Secondary | ICD-10-CM | POA: Diagnosis not present

## 2017-10-14 DIAGNOSIS — J9851 Mediastinitis: Secondary | ICD-10-CM | POA: Diagnosis not present

## 2017-10-14 DIAGNOSIS — D72829 Elevated white blood cell count, unspecified: Secondary | ICD-10-CM | POA: Diagnosis not present

## 2017-10-14 DIAGNOSIS — Z931 Gastrostomy status: Secondary | ICD-10-CM

## 2017-10-14 DIAGNOSIS — H409 Unspecified glaucoma: Secondary | ICD-10-CM | POA: Diagnosis present

## 2017-10-14 DIAGNOSIS — R1110 Vomiting, unspecified: Secondary | ICD-10-CM | POA: Diagnosis not present

## 2017-10-14 DIAGNOSIS — D631 Anemia in chronic kidney disease: Secondary | ICD-10-CM | POA: Diagnosis present

## 2017-10-14 DIAGNOSIS — Z87891 Personal history of nicotine dependence: Secondary | ICD-10-CM | POA: Diagnosis not present

## 2017-10-14 DIAGNOSIS — I82A11 Acute embolism and thrombosis of right axillary vein: Secondary | ICD-10-CM | POA: Diagnosis not present

## 2017-10-14 DIAGNOSIS — D733 Abscess of spleen: Secondary | ICD-10-CM | POA: Diagnosis not present

## 2017-10-14 DIAGNOSIS — R1310 Dysphagia, unspecified: Secondary | ICD-10-CM

## 2017-10-14 DIAGNOSIS — R502 Drug induced fever: Secondary | ICD-10-CM | POA: Diagnosis not present

## 2017-10-14 DIAGNOSIS — K5090 Crohn's disease, unspecified, without complications: Secondary | ICD-10-CM | POA: Diagnosis not present

## 2017-10-14 DIAGNOSIS — Z882 Allergy status to sulfonamides status: Secondary | ICD-10-CM | POA: Diagnosis not present

## 2017-10-14 DIAGNOSIS — Z7189 Other specified counseling: Secondary | ICD-10-CM

## 2017-10-14 DIAGNOSIS — R823 Hemoglobinuria: Secondary | ICD-10-CM | POA: Diagnosis present

## 2017-10-14 DIAGNOSIS — R042 Hemoptysis: Secondary | ICD-10-CM | POA: Diagnosis not present

## 2017-10-14 DIAGNOSIS — R112 Nausea with vomiting, unspecified: Secondary | ICD-10-CM

## 2017-10-14 DIAGNOSIS — K08109 Complete loss of teeth, unspecified cause, unspecified class: Secondary | ICD-10-CM | POA: Diagnosis not present

## 2017-10-14 DIAGNOSIS — Y95 Nosocomial condition: Secondary | ICD-10-CM | POA: Diagnosis not present

## 2017-10-14 DIAGNOSIS — Z452 Encounter for adjustment and management of vascular access device: Secondary | ICD-10-CM | POA: Diagnosis not present

## 2017-10-14 DIAGNOSIS — Z79899 Other long term (current) drug therapy: Secondary | ICD-10-CM

## 2017-10-14 DIAGNOSIS — A419 Sepsis, unspecified organism: Secondary | ICD-10-CM | POA: Diagnosis not present

## 2017-10-14 DIAGNOSIS — L409 Psoriasis, unspecified: Secondary | ICD-10-CM | POA: Diagnosis present

## 2017-10-14 DIAGNOSIS — R601 Generalized edema: Secondary | ICD-10-CM | POA: Diagnosis not present

## 2017-10-14 DIAGNOSIS — Z681 Body mass index (BMI) 19 or less, adult: Secondary | ICD-10-CM | POA: Diagnosis not present

## 2017-10-14 DIAGNOSIS — K5010 Crohn's disease of large intestine without complications: Secondary | ICD-10-CM | POA: Diagnosis present

## 2017-10-14 DIAGNOSIS — K5000 Crohn's disease of small intestine without complications: Secondary | ICD-10-CM | POA: Diagnosis not present

## 2017-10-14 DIAGNOSIS — Z9119 Patient's noncompliance with other medical treatment and regimen: Secondary | ICD-10-CM

## 2017-10-14 DIAGNOSIS — G9341 Metabolic encephalopathy: Secondary | ICD-10-CM | POA: Diagnosis not present

## 2017-10-14 DIAGNOSIS — I130 Hypertensive heart and chronic kidney disease with heart failure and stage 1 through stage 4 chronic kidney disease, or unspecified chronic kidney disease: Secondary | ICD-10-CM | POA: Diagnosis present

## 2017-10-14 DIAGNOSIS — R339 Retention of urine, unspecified: Secondary | ICD-10-CM | POA: Diagnosis not present

## 2017-10-14 DIAGNOSIS — Z515 Encounter for palliative care: Secondary | ICD-10-CM | POA: Diagnosis not present

## 2017-10-14 DIAGNOSIS — D849 Immunodeficiency, unspecified: Secondary | ICD-10-CM | POA: Diagnosis present

## 2017-10-14 DIAGNOSIS — Z888 Allergy status to other drugs, medicaments and biological substances status: Secondary | ICD-10-CM | POA: Diagnosis not present

## 2017-10-14 DIAGNOSIS — R0600 Dyspnea, unspecified: Secondary | ICD-10-CM | POA: Diagnosis not present

## 2017-10-14 DIAGNOSIS — R531 Weakness: Secondary | ICD-10-CM | POA: Diagnosis not present

## 2017-10-14 DIAGNOSIS — Z91199 Patient's noncompliance with other medical treatment and regimen due to unspecified reason: Secondary | ICD-10-CM

## 2017-10-14 DIAGNOSIS — K209 Esophagitis, unspecified: Secondary | ICD-10-CM | POA: Diagnosis present

## 2017-10-14 DIAGNOSIS — D6489 Other specified anemias: Secondary | ICD-10-CM | POA: Diagnosis present

## 2017-10-14 DIAGNOSIS — E876 Hypokalemia: Secondary | ICD-10-CM | POA: Diagnosis present

## 2017-10-14 DIAGNOSIS — I361 Nonrheumatic tricuspid (valve) insufficiency: Secondary | ICD-10-CM | POA: Diagnosis not present

## 2017-10-14 DIAGNOSIS — R0682 Tachypnea, not elsewhere classified: Secondary | ICD-10-CM

## 2017-10-14 DIAGNOSIS — B5801 Toxoplasma chorioretinitis: Secondary | ICD-10-CM | POA: Diagnosis not present

## 2017-10-14 DIAGNOSIS — Z833 Family history of diabetes mellitus: Secondary | ICD-10-CM

## 2017-10-14 DIAGNOSIS — Z8249 Family history of ischemic heart disease and other diseases of the circulatory system: Secondary | ICD-10-CM

## 2017-10-14 DIAGNOSIS — K750 Abscess of liver: Secondary | ICD-10-CM | POA: Diagnosis not present

## 2017-10-14 DIAGNOSIS — E878 Other disorders of electrolyte and fluid balance, not elsewhere classified: Secondary | ICD-10-CM | POA: Diagnosis not present

## 2017-10-14 LAB — COMPREHENSIVE METABOLIC PANEL
ALBUMIN: 2.3 g/dL — AB (ref 3.5–5.0)
ALT: 34 U/L (ref 14–54)
AST: 57 U/L — AB (ref 15–41)
Alkaline Phosphatase: 95 U/L (ref 38–126)
Anion gap: 12 (ref 5–15)
BUN: 17 mg/dL (ref 6–20)
CHLORIDE: 103 mmol/L (ref 101–111)
CO2: 18 mmol/L — AB (ref 22–32)
CREATININE: 1.29 mg/dL — AB (ref 0.44–1.00)
Calcium: 8.3 mg/dL — ABNORMAL LOW (ref 8.9–10.3)
GFR calc Af Amer: 48 mL/min — ABNORMAL LOW (ref >60)
GFR calc non Af Amer: 41 mL/min — ABNORMAL LOW (ref >60)
GLUCOSE: 84 mg/dL (ref 65–99)
Potassium: 3.5 mmol/L (ref 3.5–5.1)
SODIUM: 133 mmol/L — AB (ref 135–145)
Total Bilirubin: 1.1 mg/dL (ref 0.3–1.2)
Total Protein: 8.3 g/dL — ABNORMAL HIGH (ref 6.5–8.1)

## 2017-10-14 LAB — URINALYSIS, ROUTINE W REFLEX MICROSCOPIC
Bilirubin Urine: NEGATIVE
GLUCOSE, UA: NEGATIVE mg/dL
Ketones, ur: 5 mg/dL — AB
NITRITE: NEGATIVE
PH: 6 (ref 5.0–8.0)
Protein, ur: 30 mg/dL — AB
Specific Gravity, Urine: 1.014 (ref 1.005–1.030)

## 2017-10-14 LAB — PROTIME-INR
INR: 1.69
Prothrombin Time: 19.7 seconds — ABNORMAL HIGH (ref 11.4–15.2)

## 2017-10-14 LAB — DIFFERENTIAL
BASOS ABS: 0 10*3/uL (ref 0.0–0.1)
Basophils Relative: 0 %
EOS ABS: 0.1 10*3/uL (ref 0.0–0.7)
EOS PCT: 1 %
LYMPHS ABS: 5.1 10*3/uL — AB (ref 0.7–4.0)
Lymphocytes Relative: 22 %
MONO ABS: 3.5 10*3/uL — AB (ref 0.1–1.0)
MONOS PCT: 15 %
Neutro Abs: 14.3 10*3/uL — ABNORMAL HIGH (ref 1.7–7.7)
Neutrophils Relative %: 62 %

## 2017-10-14 LAB — CBC
HCT: 37.5 % (ref 36.0–46.0)
Hemoglobin: 12.3 g/dL (ref 12.0–15.0)
MCH: 28 pg (ref 26.0–34.0)
MCHC: 32.8 g/dL (ref 30.0–36.0)
MCV: 85.2 fL (ref 78.0–100.0)
PLATELETS: 276 10*3/uL (ref 150–400)
RBC: 4.4 MIL/uL (ref 3.87–5.11)
RDW: 17.2 % — ABNORMAL HIGH (ref 11.5–15.5)
WBC: 23 10*3/uL — ABNORMAL HIGH (ref 4.0–10.5)

## 2017-10-14 LAB — I-STAT CG4 LACTIC ACID, ED: Lactic Acid, Venous: 1.78 mmol/L (ref 0.5–1.9)

## 2017-10-14 LAB — LIPASE, BLOOD: LIPASE: 34 U/L (ref 11–51)

## 2017-10-14 MED ORDER — VANCOMYCIN HCL IN DEXTROSE 1-5 GM/200ML-% IV SOLN
1000.0000 mg | INTRAVENOUS | Status: DC
  Administered 2017-10-16: 1000 mg via INTRAVENOUS
  Filled 2017-10-14: qty 200

## 2017-10-14 MED ORDER — SODIUM CHLORIDE 0.9 % IV BOLUS (SEPSIS)
500.0000 mL | Freq: Once | INTRAVENOUS | Status: AC
  Administered 2017-10-14: 500 mL via INTRAVENOUS

## 2017-10-14 MED ORDER — IOPAMIDOL (ISOVUE-300) INJECTION 61%
100.0000 mL | Freq: Once | INTRAVENOUS | Status: AC | PRN
  Administered 2017-10-14: 100 mL via INTRAVENOUS

## 2017-10-14 MED ORDER — VANCOMYCIN HCL IN DEXTROSE 1-5 GM/200ML-% IV SOLN
1000.0000 mg | Freq: Once | INTRAVENOUS | Status: AC
  Administered 2017-10-14: 1000 mg via INTRAVENOUS
  Filled 2017-10-14: qty 200

## 2017-10-14 MED ORDER — ACETAMINOPHEN 500 MG PO TABS
1000.0000 mg | ORAL_TABLET | Freq: Once | ORAL | Status: AC
  Administered 2017-10-14: 1000 mg via ORAL
  Filled 2017-10-14: qty 2

## 2017-10-14 MED ORDER — SODIUM BICARBONATE 650 MG PO TABS
650.0000 mg | ORAL_TABLET | Freq: Two times a day (BID) | ORAL | Status: DC
  Administered 2017-10-16: 650 mg via ORAL
  Filled 2017-10-14 (×5): qty 1

## 2017-10-14 MED ORDER — SODIUM CHLORIDE 0.9 % IV SOLN
Freq: Once | INTRAVENOUS | Status: AC
  Administered 2017-10-14: 15:00:00 via INTRAVENOUS

## 2017-10-14 MED ORDER — PROMETHAZINE HCL 25 MG/ML IJ SOLN
6.2500 mg | Freq: Once | INTRAMUSCULAR | Status: AC
  Administered 2017-10-14: 6.25 mg via INTRAVENOUS
  Filled 2017-10-14: qty 1

## 2017-10-14 MED ORDER — PROMETHAZINE HCL 25 MG/ML IJ SOLN
12.5000 mg | INTRAMUSCULAR | Status: DC | PRN
Start: 2017-10-14 — End: 2017-10-21
  Administered 2017-10-14 – 2017-10-21 (×15): 12.5 mg via INTRAVENOUS
  Filled 2017-10-14 (×16): qty 1

## 2017-10-14 MED ORDER — SPIRONOLACTONE 25 MG PO TABS: 50.0000 mg | ORAL_TABLET | Freq: Every day | ORAL | Status: DC

## 2017-10-14 MED ORDER — SODIUM CHLORIDE 0.9 % IV SOLN
INTRAVENOUS | Status: DC
  Administered 2017-10-15: 06:00:00 via INTRAVENOUS

## 2017-10-14 MED ORDER — POTASSIUM CHLORIDE IN NACL 20-0.9 MEQ/L-% IV SOLN
INTRAVENOUS | Status: AC
  Administered 2017-10-14: 21:00:00 via INTRAVENOUS
  Filled 2017-10-14: qty 1000

## 2017-10-14 MED ORDER — PIPERACILLIN-TAZOBACTAM 3.375 G IVPB
3.3750 g | Freq: Three times a day (TID) | INTRAVENOUS | Status: DC
Start: 2017-10-14 — End: 2017-10-26
  Administered 2017-10-14 – 2017-10-26 (×34): 3.375 g via INTRAVENOUS
  Filled 2017-10-14 (×36): qty 50

## 2017-10-14 MED ORDER — PIPERACILLIN-TAZOBACTAM 3.375 G IVPB 30 MIN
3.3750 g | Freq: Once | INTRAVENOUS | Status: AC
  Administered 2017-10-14: 3.375 g via INTRAVENOUS
  Filled 2017-10-14: qty 50

## 2017-10-14 MED ORDER — IOPAMIDOL (ISOVUE-300) INJECTION 61%
INTRAVENOUS | Status: AC
  Filled 2017-10-14: qty 100

## 2017-10-14 MED ORDER — SODIUM CHLORIDE 0.9 % IV BOLUS (SEPSIS)
1000.0000 mL | Freq: Once | INTRAVENOUS | Status: AC
  Administered 2017-10-14: 1000 mL via INTRAVENOUS

## 2017-10-14 MED ORDER — PIPERACILLIN-TAZOBACTAM 3.375 G IVPB 30 MIN
3.3750 g | Freq: Three times a day (TID) | INTRAVENOUS | Status: DC
Start: 2017-10-14 — End: 2017-10-14

## 2017-10-14 MED ORDER — NYSTATIN 100000 UNIT/GM EX POWD
1.0000 g | Freq: Two times a day (BID) | CUTANEOUS | Status: DC | PRN
  Filled 2017-10-14: qty 15

## 2017-10-14 MED ORDER — ONDANSETRON HCL 4 MG/2ML IJ SOLN
4.0000 mg | Freq: Once | INTRAMUSCULAR | Status: DC
  Filled 2017-10-14: qty 2

## 2017-10-14 NOTE — ED Notes (Signed)
Admitting provider at bedside.

## 2017-10-14 NOTE — Progress Notes (Signed)
Pharmacy Antibiotic Note  Danielle [name] is a 71 y.o. female admitted on 10/23/2017 with Mediatinal abscess.  Pharmacy has been consulted for Vancomycin and Zosyn dosing.  Plan: Zosyn 3.375g IV q8h (4 hour infusion).  Vancomycin 1gm iv x1, then 1gm iv q36hr   Height: 5' 4.5" (163.8 cm) Weight: 130 lb (59 kg) IBW/kg (Calculated) : 55.85  Temp (24hrs), Avg:97.5 F (36.4 C), Min:97.4 F (36.3 C), Max:97.6 F (36.4 C)  Recent Labs  Lab 10/11/2017 1405 10/09/2017 1420  WBC 23.0*  --   CREATININE 1.29*  --   LATICACIDVEN  --  1.78    Estimated Creatinine Clearance: 36.3 mL/min (A) (by C-G formula based on SCr of 1.29 mg/dL (H)).    Allergies  Allergen Reactions  . Zithromax [Azithromycin] Swelling and Other (See Comments)    Mouth swelling and blisters   . Aspirin Other (See Comments)    Stomach cramping   . Ibuprofen Other (See Comments)    Pt states that she prefers not to take this medication.   . Iron Nausea And Vomiting and Other (See Comments)    FERROUS SULFATE  . Sulfa Antibiotics Nausea And Vomiting    Antimicrobials this admission: Vancomycin 10/31/2017 >> Zosyn 10/31/2017 >>   Dose adjustments this admission: -  Microbiology results: pending  Thank you for allowing pharmacy to be a part of this patient's care.  Nani Skillern Crowford 10/08/2017 11:10 PM

## 2017-10-14 NOTE — ED Notes (Signed)
2 attempts at IV unsuccessful. Another RN to attempt [place] IV.

## 2017-10-14 NOTE — ED Notes (Signed)
Patient transported to CT 

## 2017-10-14 NOTE — H&P (Signed)
History and Physical    Danielle [name]:[id number] DOB: 23-May-1946 DOA: 10/22/2017  PCP: Iona Beard, MD   Patient coming from: Home.  I have personally briefly reviewed patient's old medical records in [place]  Chief Complaint: Nausea and vomiting.  HPI: Danielle [name] is a 71 y.o. female with medical history significant of Crohn's disease, hiatal hernia, left buttock abscess, psoriasis, small bowel obstruction who has been recently admitted in the discharge twice in the past 5 weeks, first from 09/08/2017 to 09/26/2017 after undergoing type IV hiatal hernia repair.  Postoperatively the patient required endotracheal intubation, mechanical ventilation and the use of pressors.  She was extubated on postop day #2.  She improved clinically and was on parenteral nutrition, however she developed a pelvic fluid collection which require a drain placement.  On 09/11/2017 the patient blood cultures were positive for Candida glabrata and abscess culture were positive for C. Albicans and C. Glabrata. She was given treatment with parenteral antifungals. She subsequently improved was able to ambulate with physical therapy, weaned off of parenteral nutrition and was tolerating oral diet. ID recommended to continue Invanz and Erasis for 2 weeks. A PICC line was placed in her right upper arm and she was subsequently discharged home.  She returned on 10/01/2017 due to intermittent fevers.  She was placed on the neck slowly and cultures were obtained.  Antibiotics were discontinue 24 hours prior to discharge and the patient remained afebrile and clinically improved.  However, she returns today with complaints of persistent nausea associated with about 3 episodes of emesis daily.  She has also had loose stools, but stated that this is her baseline since she suffers from Crohn's disease. She denies fever, chills, rhinorrhea, sore throat, productive cough, dyspnea, chest pain, palpitations, dizziness,  diaphoresis, PND, orthopnea or pitting edema of the lower extremities. She denies constipation, melena or hematochezia.  She denies dysuria, frequency or hematuria.  Denies polyuria, polydipsia or blurred vision.  ED Course: Initial vital signs in the emergency department temperature 97.6, pulse 118, blood pressure 96/65 mmHg, respirations 24 and O2 sat 100%.  She received 2000 mL of normal saline bolus, promethazine 6.25 mg IVP x2, vancomycin and Zosyn.  Her workup shows urine analysis with moderate hemoglobinuria, large leukocyte esterase and TNTC WBCs in urine. Her CBC shows leukocytes of 23K with 62% neutrophils, hemoglobin 12.3 g/dL and platelets 276. PT was 19.7 and INR 1.69.  Sodium was 133, potassium 3.5, chloride 103, bicarbonate 18 and lactic acid 1.78 mmol/L.  BUN was 17, creatinine 1.29 and glucose 84 mg/dL.  AST was 57. Total protein 8.3 and albumin 2.3 g/dL.  Imaging: CT abdomen/pelvis with contrast showed reaccumulation of fluid collection in the lower mediastinum, bibasilar pleural effusions, thickening of the herniated portion of the gastric wall.  Another small complex collection along the posterior margin of the spleen, which is likely a small abscess measuring 3.3 x 1.3 cm.  Please see images and full radiology report for further detail.   Review of Systems: As per HPI otherwise 10 point review of systems negative.    Past Medical History:  Diagnosis Date  . Crohn disease ([place])   . Hiatal hernia 12/07/2015  . Left buttock abscess 04/21/2017  . Psoriasis   . SBO (small bowel obstruction) ([place]) 07/27/2017    Past Surgical History:  Procedure Laterality Date  . ANAL STENOSIS  ~ 2010  . APPENDECTOMY  1980's  . BOWEL RESECTION  2009  . CHOLECYSTECTOMY  1990's  .  COLONOSCOPY  2013  . COLOSTOMY  2009   DEMASO  . COLOSTOMY TAKEDOWN  2009   "only had it for about 1 month" (08/22/2013)  . goiter  1999     reports that she quit smoking about 21 years ago. Her smoking use  included cigarettes. She has a 0.48 pack-year smoking history. she has never used smokeless tobacco. She reports that she does not drink alcohol or use drugs.  Allergies  Allergen Reactions  . Zithromax [Azithromycin] Swelling and Other (See Comments)    Mouth swelling and blisters   . Aspirin Other (See Comments)    Stomach cramping   . Ibuprofen Other (See Comments)    Pt states that she prefers not to take this medication.   . Iron Nausea And Vomiting and Other (See Comments)    FERROUS SULFATE  . Sulfa Antibiotics Nausea And Vomiting    Family History  Problem Relation Age of Onset  . Diabetes Mother   . Heart disease Father   . Healthy Sister   . Hypertension Brother   . Colon cancer Brother        died with colon cancer  . Crohn's disease Paternal Aunt     Prior to Admission medications   Medication Sig Start Date End Date Taking? Authorizing Provider  albuterol (PROVENTIL HFA;VENTOLIN HFA) 108 (90 Base) MCG/ACT inhaler Inhale 2 puffs into the lungs every 6 (six) hours as needed for wheezing. 12/09/15  Yes Rama, Venetia Maxon, MD  apixaban (ELIQUIS) 5 MG TABS tablet Take 10 mg twice a day for 7 days and then start using 5 mg BID. 10/07/17  Yes Barton Dubois, MD  cyanocobalamin (,VITAMIN B-12,) 1000 MCG/ML injection INJECT 1000 MCG IM ONCE MONTHLY 07/09/17  Yes Rehman, Mechele Dawley, MD  dicyclomine (BENTYL) 10 MG/5ML syrup TAKE 10 MLS (20 MG TOTAL) BY MOUTH 3 (THREE) TIMES DAILY AS NEEDED. Patient taking differently: Take 20 mg 3 (three) times daily as needed by mouth (stomach cramps).  07/26/17  Yes Rehman, Mechele Dawley, MD  diphenoxylate-atropine (LOMOTIL) 2.5-0.025 MG tablet TAKE 1 TABLET BY MOUTH FOUR TIMES A DAY AS NEEDED Patient taking differently: Take one tablet twice daily. 06/28/17  Yes Setzer, Terri L, NP  feeding supplement (BOOST / RESOURCE BREEZE) LIQD Take 1 Container by mouth 3 (three) times daily between meals. 10/07/17  Yes Barton Dubois, MD  furosemide (LASIX) 20 MG tablet  Take 1 tablet (20 mg total) by mouth daily as needed (Take for weight gain of more than 2 pounds in a day or 5 pounds in 2 days.). 06/16/17  Yes Lavina Hamman, MD  HYDROcodone-acetaminophen (NORCO) 7.5-325 MG tablet Take 1 tablet by mouth every 6 (six) hours as needed for moderate pain or severe pain. 09/26/17  Yes Michael Boston, MD  magnesium oxide (MAG-OX) 400 (241.3 Mg) MG tablet Take 1 tablet (400 mg total) by mouth daily. 10/07/17  Yes Barton Dubois, MD  nystatin (NYSTATIN) powder Apply 1 g topically 2 (two) times daily as needed (for itching/irritation).   Yes [provider]  pantoprazole (PROTONIX) 40 MG tablet Take 1 tablet (40 mg total) by mouth daily. 06/16/17  Yes Nita Sells, MD  prochlorperazine (COMPAZINE) 10 MG tablet Take 1 tablet (10 mg total) by mouth every 6 (six) hours as needed for refractory nausea / vomiting. 10/07/17  Yes Barton Dubois, MD  sodium bicarbonate 650 MG tablet Take 650 mg by mouth 2 (two) times daily.   Yes [provider]  spironolactone (ALDACTONE) 50  MG tablet Take 50 mg by mouth daily.  06/23/17  Yes [provider]  acetaminophen (TYLENOL) 325 MG tablet Take 2 tablets (650 mg total) by mouth every 6 (six) hours as needed for mild pain or moderate pain. Patient not taking: Reported on 11/03/2017 10/07/17   Barton Dubois, MD  promethazine (PHENERGAN) 25 MG tablet TAKE 1 TABLET (25 MG TOTAL) BY MOUTH 2 (TWO) TIMES DAILY AS NEEDED FOR NAUSEA OR VOMITING. Patient not taking: Reported on 10/15/2017 09/29/17   Rogene Houston, MD    Physical Exam: Vitals:   10/25/2017 2130 11/04/2017 2200 10/12/2017 2225 10/17/2017 2300  BP: (!) 81/53 99/86 (!) 79/50 (!) 90/44  Pulse: (!) 103 92 93 90  Resp: (!) 31 (!) 37 (!) 32 (!) 26  Temp:    (!) 96.4 F (35.8 C)  TempSrc:    Axillary  SpO2: 98%  100% 100%  Weight:      Height:        Constitutional: NAD, calm, comfortable Eyes: PERRL, lids and conjunctivae normal ENMT: Mucous membranes  are moist. Posterior pharynx clear of any exudate or lesions. Neck: Normal, supple, no masses, no thyromegaly Respiratory: Decreased breath sounds on bases, otherwise clear to auscultation bilaterally, no wheezing, no crackles. Normal respiratory effort. No accessory muscle use.  Cardiovascular: Regular rate and rhythm, no murmurs / rubs / gallops. No extremity edema. 2+ pedal pulses. No carotid bruits.  Abdomen: Soft, positive mild epigastric tenderness, no guarding/rebound/masses palpated. No hepatosplenomegaly. Bowel sounds positive.  Musculoskeletal: no clubbing / cyanosis. Good ROM, no contractures. Normal muscle tone.  Skin: Dressing over substernal/supra gastric surgical wound which shows a small amount of purulent, foul-smelling drainage. Neurologic: CN 2-12 grossly intact. Sensation intact, DTR normal. Strength 5/5 in all 4.  Psychiatric: Normal judgment and insight. Alert and oriented x 4.   Labs on Admission: I have personally reviewed following labs and imaging studies  CBC: Recent Labs  Lab 10/10/2017 1405  WBC 23.0*  NEUTROABS 14.3*  HGB 12.3  HCT 37.5  MCV 85.2  PLT 094   Basic Metabolic Panel: Recent Labs  Lab 10/12/2017 1405  NA 133*  K 3.5  CL 103  CO2 18*  GLUCOSE 84  BUN 17  CREATININE 1.29*  CALCIUM 8.3*   GFR: Estimated Creatinine Clearance: 36.3 mL/min (A) (by C-G formula based on SCr of 1.29 mg/dL (H)). Liver Function Tests: Recent Labs  Lab 10/15/2017 1405  AST 57*  ALT 34  ALKPHOS 95  BILITOT 1.1  PROT 8.3*  ALBUMIN 2.3*   Recent Labs  Lab 10/07/2017 1405  LIPASE 34   No results for input(s): AMMONIA in the last 168 hours. Coagulation Profile: Recent Labs  Lab 11/03/2017 1405  INR 1.69   Cardiac Enzymes: No results for input(s): CKTOTAL, CKMB, CKMBINDEX, TROPONINI in the last 168 hours. BNP (last 3 results) No results for input(s): PROBNP in the last 8760 hours. HbA1C: No results for input(s): HGBA1C in the last 72 hours. CBG: No  results for input(s): GLUCAP in the last 168 hours. Lipid Profile: No results for input(s): CHOL, HDL, LDLCALC, TRIG, CHOLHDL, LDLDIRECT in the last 72 hours. Thyroid Function Tests: No results for input(s): TSH, T4TOTAL, FREET4, T3FREE, THYROIDAB in the last 72 hours. Anemia Panel: No results for input(s): VITAMINB12, FOLATE, FERRITIN, TIBC, IRON, RETICCTPCT in the last 72 hours. Urine analysis:    Component Value Date/Time   COLORURINE YELLOW 10/18/2017 1501   APPEARANCEUR HAZY (A) 10/24/2017 1501   LABSPEC 1.014 10/17/2017 1501  PHURINE 6.0 11/04/2017 1501   GLUCOSEU NEGATIVE 10/29/2017 1501   HGBUR MODERATE (A) 10/13/2017 1501   BILIRUBINUR NEGATIVE 10/17/2017 1501   KETONESUR 5 (A) 10/29/2017 1501   PROTEINUR 30 (A) 11/03/2017 1501   UROBILINOGEN 0.2 07/29/2015 2231   NITRITE NEGATIVE 10/22/2017 1501   LEUKOCYTESUR LARGE (A) 11/02/2017 1501    Radiological Exams on Admission: Dg Chest 2 View  Result Date: 10/19/2017 CLINICAL DATA:  Dyspnea and possible sepsis. EXAM: CHEST  2 VIEW COMPARISON:  10/05/2017 CT chest.  09/2028 CXR. FINDINGS: Borderline cardiomegaly with minimal aortic atherosclerosis. There has been interval decrease in left-sided pleural effusion now small to moderate. Probable compressive atelectasis at the left lung base accounting for retrocardiac homogeneous opacity that still persists. Mild central vascular congestion is again re- demonstrated. No acute osseous abnormality. Chronic right-sided rib fractures. The patient's chin obscures the lung apices. Left-sided PICC line has been removed in the interim. IMPRESSION: 1. Borderline cardiomegaly with interval decrease in left pleural effusion. Persistent presumed compressive atelectasis at the left lung base and mild central vascular congestion. 2. Mild aortic atherosclerosis. Electronically Signed   By: Ashley Royalty M.D.   On: 10/22/2017 14:58   Ct Abdomen Pelvis W Contrast  Result Date: 10/12/2017 CLINICAL DATA:   Nausea, vomiting.  Decreased urine output. EXAM: CT ABDOMEN AND PELVIS WITH CONTRAST TECHNIQUE: Multidetector CT imaging of the abdomen and pelvis was performed using the standard protocol following bolus administration of intravenous contrast. CONTRAST:  136mL ISOVUE-300 IOPAMIDOL (ISOVUE-300) INJECTION 61% COMPARISON:  CT abdomen dated 10/01/2017. FINDINGS: Lower chest: Bibasilar pleural effusions, left greater than right, incompletely imaged, with associated atelectasis. Interval reaccumulation of the fluid collection in the lower mediastinum, surrounding the herniated stomach, measuring approximately 8 cm transverse dimension by 3 cm greatest thickness, craniocaudal dimension incompletely imaged. This collection is now similar to the size seen on CT abdomen of 09/16/2017, when a drainage catheter was in place. Hepatobiliary: No focal liver abnormality is seen. Status post cholecystectomy. No biliary dilatation. Pancreas: Unremarkable. No pancreatic ductal dilatation or surrounding inflammatory changes. Spleen: Small complex collection along the posterior margin of the spleen, possibly abscess, measuring 3.3 x 1.3 cm. Spleen otherwise unremarkable. Adrenals/Urinary Tract: Adrenal glands appear normal. Kidneys are unremarkable without mass, stone or hydronephrosis. No ureteral or bladder calculi. Bladder is unremarkable. Stomach/Bowel: No dilated large or small bowel loops. No convincing evidence of bowel wall thickening or bowel wall inflammation within the abdomen or pelvis. There is at least some thickening/inflammation of the walls of the herniated stomach and lower esophagus, again incompletely imaged, likely reactive to the adjacent fluid collection. Vascular/Lymphatic: No significant vascular findings are present. No enlarged abdominal or pelvic lymph nodes. Reproductive: No adnexal mass or free fluid. Other: No free intraperitoneal air. Musculoskeletal: No acute or suspicious osseous finding. IMPRESSION:  1. Reaccumulation of the previously demonstrated fluid collection in the lower mediastinum, presumably abscess, likely multiloculated, surrounding the herniated portion of the stomach and lower esophagus, measuring 8 x 3 cm (transverse by AP dimensions), craniocaudal extent incompletely imaged. The size of the fluid collection is now similar to the size seen on CT abdomen of 09/16/2017, at which point there were drainage catheters in place. 2. Thickening of the walls of the herniated portion of the stomach and the overlying esophagus, incompletely imaged, likely reactive to the adjacent fluid collection/abscess. 3. Bibasilar pleural effusions, left greater than right, incompletely imaged, with associated atelectasis. 4. Small complex collection along the posterior margin of the spleen, possibly additional small abscess,  measuring 3.3 x 1.3 cm. 5. No evidence of bowel obstruction within the abdomen or pelvis. Recommend chest CT to determine the entire craniocaudal extent of the mediastinal fluid collection. These results were called by telephone at the time of interpretation on 10/13/2017 at 5:45 pm to Dr. Vanita Panda, who verbally acknowledged these results. Electronically Signed   By: Franki Cabot M.D.   On: 10/27/2017 17:51    EKG: Independently reviewed.   Assessment/Plan Principal Problem:   Abscess of mediastinum (HCC) Admit to SDU/Inpatient. Continue IV fluids. Antiemetics as needed. Continue vancomycin per pharmacy. Continue Zosyn 3.375 g every 8 hours IVPB. Follow-up blood cultures and sensitivity. N.p.o. after midnight in anticipation of surgical evaluation in a.m. Please consult interventional radiology to see if this is amenable to drainage with [place] or CT guidance.  Active Problems:   UTI (urinary tract infection) Continue IV antibiotics. Follow-up urine culture and sensitivity.    Hypotension Per patient and sister, she usually runs a low blood pressure. However, due to decreased  oral intake and clinical condition may be lower than usual. Continue IV fluids.  Monitor blood pressure.  Monitor heart rate.    Leukocytosis Secondary to above. Continue IV antibiotics. Monitor WBC.    Nausea and vomiting Denies feeling nauseous at this time. Continue IVF. Clear liquids diet until midnight. NPO after midnight.    DVT (deep venous thrombosis) (HCC) Eliquis has been held anticipating possible procedure.      DVT prophylaxis: On Eliquis. Code Status: Full code. Family Communication: Her sister Louretta Parma was in the emergency department. Her telephone number is [phone]. Disposition Plan: Admit for IV antibiotic therapy and evaluation  Consults called: Dr. Leighton Ruff was contacted by the ED Admission status: Inpatient/SDU.   Reubin Milan MD [facility] Pager [phone].  If 7PM-7AM, please contact night-coverage [email or web address] Password [email or web address]  10/08/2017, 11:26 PM

## 2017-10-14 NOTE — ED Provider Notes (Signed)
[facility] DEPT Provider Note   CSN: [id number] Arrival date & time: 10/07/2017  1235     History   Chief Complaint Chief Complaint  Patient presents with  . Nausea  . Emesis    HPI Danielle [name] is a 71 y.o. female.  HPI   71 yo F with PMHx Crohn's disease, recent hospitalization for sepsis 2/2 Candida bacteremia, PICC line DVT here with diffuse abdominal pain, nausea, vomiting.  Patient reports that since she was recently discharged, she has been unable to eat or drink.  She reports persistent nausea as well as mild epigastric abdominal pain.  The pain is cramping.  She reportedly has not had any solid food since discharge, and has become progressively weak due to this.  She endorses generalized fatigue and states she feels "just sick."  She denies any chest pain or shortness of breath.  No cough.  She does endorse foul-smelling and darkened urine.  No flank pain.  She states that the site of her previous JP drain continues to drain, but has not increased in drainage.  Past Medical History:  Diagnosis Date  . Crohn disease ([place])   . Hiatal hernia 12/07/2015  . Left buttock abscess 04/21/2017  . Psoriasis   . SBO (small bowel obstruction) ([place]) 07/27/2017    Patient Active Problem List   Diagnosis Date Noted  . DVT (deep venous thrombosis) ([place])   . Metabolic acidosis   . Thrombocytosis ([place])   . Protein-calorie malnutrition, moderate ([place])   . Normochromic normocytic anemia   . SIRS (systemic inflammatory response syndrome) ([place]) 10/01/2017  . Pressure injury of skin 09/25/2017  . Hypoglycemia 09/25/2017  . Electrolyte imbalance 09/15/2017  . Encephalopathy acute 09/13/2017  . Anasarca 09/13/2017  . History of repair of hiatal hernia 09/11/2017  . Peritonitis ([place]) 09/11/2017  . Fungemia 09/11/2017  . Gastrostomy tube in place ([place]) 09/08/2017  . Protein-calorie malnutrition, severe 06/10/2017  . Acute lower UTI 06/09/2017  . Nausea  and vomiting 06/08/2017  . 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 Chronic diarrhea 05/09/2017  . Infliximab (Remicade) long-term use 05/09/2017  . Schatzki's ring of distal esophagus 05/09/2017  . Anorexia 05/09/2017  . Weight loss, unintentional 05/09/2017  . Hiatal hernia with GERD 05/07/2017  . UTI (urinary tract infection) 05/07/2017  . Tachycardia 04/27/2017  . Perineal irritation in female 04/27/2017  . Mucosal irritation of oral cavity 04/27/2017  . Hypokalemia 04/27/2017  . Chest pain   . Facial swelling   . Right facial numbness   . Crohn disease ([place]) 02/19/2017  . Immunosuppressed status ([place]) 12/09/2015  . 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 Incarcerated hiatal hernia s/p repair 09/08/2017 12/07/2015  . Sepsis due to pneumonia ([place]) 12/06/2015  . Acute renal failure superimposed on stage 3 chronic kidney disease ([place]) 12/06/2015  . Perianal Crohn's disease, with fistula ([place])   . Hypomagnesemia   . Leukocytosis 11/06/2011    Past Surgical History:  Procedure Laterality Date  . ANAL STENOSIS  ~ 2010  . APPENDECTOMY  1980's  . BOWEL RESECTION  2009  . CHOLECYSTECTOMY  1990's  . COLONOSCOPY  2013  . COLOSTOMY  2009   DEMASO  . COLOSTOMY TAKEDOWN  2009   "only had it for about 1 month" (08/22/2013)  . goiter  1999    OB History    No data available       Home Medications    Prior to Admission medications   Medication Sig Start Date End Date  Taking? Authorizing Provider  albuterol (PROVENTIL HFA;VENTOLIN HFA) 108 (90 Base) MCG/ACT inhaler Inhale 2 puffs into the lungs every 6 (six) hours as needed for wheezing. 12/09/15  Yes Rama, Venetia Maxon, MD  apixaban (ELIQUIS) 5 MG TABS tablet Take 10 mg twice a day for 7 days and then start using 5 mg BID. 10/07/17  Yes Barton Dubois, MD  cyanocobalamin (,VITAMIN B-12,) 1000 MCG/ML injection INJECT 1000 MCG IM ONCE MONTHLY 07/09/17  Yes Rehman, Mechele Dawley, MD  dicyclomine (BENTYL) 10 MG/5ML syrup TAKE 10 MLS (20 MG TOTAL) BY MOUTH 3  (THREE) TIMES DAILY AS NEEDED. Patient taking differently: Take 20 mg 3 (three) times daily as needed by mouth (stomach cramps).  07/26/17  Yes Rehman, Mechele Dawley, MD  diphenoxylate-atropine (LOMOTIL) 2.5-0.025 MG tablet TAKE 1 TABLET BY MOUTH FOUR TIMES A DAY AS NEEDED Patient taking differently: Take one tablet twice daily. 06/28/17  Yes Setzer, Terri L, NP  feeding supplement (BOOST / RESOURCE BREEZE) LIQD Take 1 Container by mouth 3 (three) times daily between meals. 10/07/17  Yes Barton Dubois, MD  furosemide (LASIX) 20 MG tablet Take 1 tablet (20 mg total) by mouth daily as needed (Take for weight gain of more than 2 pounds in a day or 5 pounds in 2 days.). 06/16/17  Yes Lavina Hamman, MD  HYDROcodone-acetaminophen (NORCO) 7.5-325 MG tablet Take 1 tablet by mouth every 6 (six) hours as needed for moderate pain or severe pain. 09/26/17  Yes Michael Boston, MD  magnesium oxide (MAG-OX) 400 (241.3 Mg) MG tablet Take 1 tablet (400 mg total) by mouth daily. 10/07/17  Yes Barton Dubois, MD  nystatin (NYSTATIN) powder Apply 1 g topically 2 (two) times daily as needed (for itching/irritation).   Yes [provider]  pantoprazole (PROTONIX) 40 MG tablet Take 1 tablet (40 mg total) by mouth daily. 06/16/17  Yes Nita Sells, MD  prochlorperazine (COMPAZINE) 10 MG tablet Take 1 tablet (10 mg total) by mouth every 6 (six) hours as needed for refractory nausea / vomiting. 10/07/17  Yes Barton Dubois, MD  sodium bicarbonate 650 MG tablet Take 650 mg by mouth 2 (two) times daily.   Yes [provider]  spironolactone (ALDACTONE) 50 MG tablet Take 50 mg by mouth daily.  06/23/17  Yes [provider]  acetaminophen (TYLENOL) 325 MG tablet Take 2 tablets (650 mg total) by mouth every 6 (six) hours as needed for mild pain or moderate pain. Patient not taking: Reported on 10/07/2017 10/07/17   Barton Dubois, MD  promethazine (PHENERGAN) 25 MG tablet TAKE 1 TABLET (25 MG TOTAL) BY MOUTH 2  (TWO) TIMES DAILY AS NEEDED FOR NAUSEA OR VOMITING. Patient not taking: Reported on 10/16/2017 09/29/17   Rogene Houston, MD    Family History Family History  Problem Relation Age of Onset  . Diabetes Mother   . Heart disease Father   . Healthy Sister   . Hypertension Brother   . Colon cancer Brother        died with colon cancer  . Crohn's disease Paternal Aunt     Social History Social History   Tobacco Use  . Smoking status: Former Smoker    Packs/day: 0.12    Years: 4.00    Pack years: 0.48    Types: Cigarettes    Last attempt to quit: 10/05/1996    Years since quitting: 21.0  . Smokeless tobacco: Never Used  . Tobacco comment: 08/22/2013 1 pack every two weeks when she did smoked  Substance Use Topics  . Alcohol use: No    Alcohol/week: 0.0 oz  . Drug use: No     Allergies   Zithromax [azithromycin]; Aspirin; Ibuprofen; Iron; and Sulfa antibiotics   Review of Systems Review of Systems  Constitutional: Positive for fatigue. Negative for chills and fever.  HENT: Negative for congestion, rhinorrhea and sore throat.   Eyes: Negative for visual disturbance.  Respiratory: Negative for cough, shortness of breath and wheezing.   Cardiovascular: Negative for chest pain and leg swelling.  Gastrointestinal: Positive for abdominal pain, nausea and vomiting. Negative for diarrhea.  Genitourinary: Negative for dysuria, flank pain, vaginal bleeding and vaginal discharge.  Musculoskeletal: Negative for neck pain.  Skin: Negative for rash.  Allergic/Immunologic: Negative for immunocompromised state.  Neurological: Positive for weakness. Negative for syncope and headaches.  Hematological: Does not bruise/bleed easily.  All other systems reviewed and are negative.    Physical Exam Updated Vital Signs BP (!) 85/54   Pulse (!) 104   Temp (!) 97.4 F (36.3 C) (Oral)   Resp (!) 32   Ht 5' 4.5" (1.638 m)   Wt 59 kg (130 lb)   SpO2 98%   BMI 21.97 kg/m   Physical  Exam  Constitutional: She is oriented to person, place, and time. She appears well-developed.  Chronically ill appearing, but non-toxic  HENT:  Head: Normocephalic and atraumatic.  Dry MM  Eyes: Conjunctivae are normal.  Neck: Neck supple.  Cardiovascular: Regular rhythm and normal heart sounds. Tachycardia present. Exam reveals no friction rub.  No murmur heard. Pulmonary/Chest: Effort normal and breath sounds normal. Tachypnea noted. No respiratory distress. She has no wheezes. She has no rales.  Abdominal: She exhibits no distension. There is generalized tenderness. There is no rigidity, no rebound and no guarding.  Midline drain site with small amount of foul-smelling drainage. No bleeding. No guarding, rigidity, or rebound.  Musculoskeletal: She exhibits no edema.  Neurological: She is alert and oriented to person, place, and time. She exhibits normal muscle tone.  Skin: Skin is warm. Capillary refill takes less than 2 seconds.  Psychiatric: She has a normal mood and affect.  Nursing note and vitals reviewed.    ED Treatments / Results  Labs (all labs ordered are listed, but only abnormal results are displayed) Labs Reviewed  CBC - Abnormal; Notable for the following components:      Result Value   WBC 23.0 (*)    RDW 17.2 (*)    All other components within normal limits  URINALYSIS, ROUTINE W REFLEX MICROSCOPIC - Abnormal; Notable for the following components:   APPearance HAZY (*)    Hgb urine dipstick MODERATE (*)    Ketones, ur 5 (*)    Protein, ur 30 (*)    Leukocytes, UA LARGE (*)    Bacteria, UA RARE (*)    Squamous Epithelial / LPF 0-5 (*)    All other components within normal limits  PROTIME-INR - Abnormal; Notable for the following components:   Prothrombin Time 19.7 (*)    All other components within normal limits  COMPREHENSIVE METABOLIC PANEL - Abnormal; Notable for the following components:   Sodium 133 (*)    CO2 18 (*)    Creatinine, Ser 1.29 (*)     Calcium 8.3 (*)    Total Protein 8.3 (*)    Albumin 2.3 (*)    AST 57 (*)    GFR calc non Af Amer 41 (*)    GFR calc Af Wyvonnia Lora  48 (*)    All other components within normal limits  DIFFERENTIAL - Abnormal; Notable for the following components:   Neutro Abs 14.3 (*)    Lymphs Abs 5.1 (*)    Monocytes Absolute 3.5 (*)    All other components within normal limits  CULTURE, BLOOD (ROUTINE X 2)  CULTURE, BLOOD (ROUTINE X 2)  LIPASE, BLOOD  I-STAT CG4 LACTIC ACID, ED    EKG  EKG Interpretation None       Radiology Dg Chest 2 View  Result Date: 10/07/2017 CLINICAL DATA:  Dyspnea and possible sepsis. EXAM: CHEST  2 VIEW COMPARISON:  10/05/2017 CT chest.  09/2028 CXR. FINDINGS: Borderline cardiomegaly with minimal aortic atherosclerosis. There has been interval decrease in left-sided pleural effusion now small to moderate. Probable compressive atelectasis at the left lung base accounting for retrocardiac homogeneous opacity that still persists. Mild central vascular congestion is again re- demonstrated. No acute osseous abnormality. Chronic right-sided rib fractures. The patient's chin obscures the lung apices. Left-sided PICC line has been removed in the interim. IMPRESSION: 1. Borderline cardiomegaly with interval decrease in left pleural effusion. Persistent presumed compressive atelectasis at the left lung base and mild central vascular congestion. 2. Mild aortic atherosclerosis. Electronically Signed   By: Ashley Royalty M.D.   On: 11/03/2017 14:58   Ct Abdomen Pelvis W Contrast  Result Date: 10/15/2017 CLINICAL DATA:  Nausea, vomiting.  Decreased urine output. EXAM: CT ABDOMEN AND PELVIS WITH CONTRAST TECHNIQUE: Multidetector CT imaging of the abdomen and pelvis was performed using the standard protocol following bolus administration of intravenous contrast. CONTRAST:  199mL ISOVUE-300 IOPAMIDOL (ISOVUE-300) INJECTION 61% COMPARISON:  CT abdomen dated 10/01/2017. FINDINGS: Lower chest:  Bibasilar pleural effusions, left greater than right, incompletely imaged, with associated atelectasis. Interval reaccumulation of the fluid collection in the lower mediastinum, surrounding the herniated stomach, measuring approximately 8 cm transverse dimension by 3 cm greatest thickness, craniocaudal dimension incompletely imaged. This collection is now similar to the size seen on CT abdomen of 09/16/2017, when a drainage catheter was in place. Hepatobiliary: No focal liver abnormality is seen. Status post cholecystectomy. No biliary dilatation. Pancreas: Unremarkable. No pancreatic ductal dilatation or surrounding inflammatory changes. Spleen: Small complex collection along the posterior margin of the spleen, possibly abscess, measuring 3.3 x 1.3 cm. Spleen otherwise unremarkable. Adrenals/Urinary Tract: Adrenal glands appear normal. Kidneys are unremarkable without mass, stone or hydronephrosis. No ureteral or bladder calculi. Bladder is unremarkable. Stomach/Bowel: No dilated large or small bowel loops. No convincing evidence of bowel wall thickening or bowel wall inflammation within the abdomen or pelvis. There is at least some thickening/inflammation of the walls of the herniated stomach and lower esophagus, again incompletely imaged, likely reactive to the adjacent fluid collection. Vascular/Lymphatic: No significant vascular findings are present. No enlarged abdominal or pelvic lymph nodes. Reproductive: No adnexal mass or free fluid. Other: No free intraperitoneal air. Musculoskeletal: No acute or suspicious osseous finding. IMPRESSION: 1. Reaccumulation of the previously demonstrated fluid collection in the lower mediastinum, presumably abscess, likely multiloculated, surrounding the herniated portion of the stomach and lower esophagus, measuring 8 x 3 cm (transverse by AP dimensions), craniocaudal extent incompletely imaged. The size of the fluid collection is now similar to the size seen on CT abdomen  of 09/16/2017, at which point there were drainage catheters in place. 2. Thickening of the walls of the herniated portion of the stomach and the overlying esophagus, incompletely imaged, likely reactive to the adjacent fluid collection/abscess. 3. Bibasilar pleural effusions, left greater  than right, incompletely imaged, with associated atelectasis. 4. Small complex collection along the posterior margin of the spleen, possibly additional small abscess, measuring 3.3 x 1.3 cm. 5. No evidence of bowel obstruction within the abdomen or pelvis. Recommend chest CT to determine the entire craniocaudal extent of the mediastinal fluid collection. These results were called by telephone at the time of interpretation on 11/04/2017 at 5:45 pm to Dr. Vanita Panda, who verbally acknowledged these results. Electronically Signed   By: Franki Cabot M.D.   On: 10/29/2017 17:51    Procedures .Critical Care Performed by: Duffy Bruce, MD Authorized by: Duffy Bruce, MD   Critical care provider statement:    Critical care time (minutes):  35   Critical care time was exclusive of:  Separately billable procedures and treating other patients and teaching time   Critical care was necessary to treat or prevent imminent or life-threatening deterioration of the following conditions:  Sepsis, circulatory failure and dehydration   Critical care was time spent personally by me on the following activities:  Development of treatment plan with patient or surrogate, discussions with consultants, evaluation of patient's response to treatment, examination of patient, obtaining history from patient or surrogate, ordering and performing treatments and interventions, ordering and review of laboratory studies, ordering and review of radiographic studies, pulse oximetry, re-evaluation of patient's condition and review of old charts   I assumed direction of critical care for this patient from another provider in my specialty: no      (including critical care time)  Medications Ordered in ED Medications  sodium chloride 0.9 % bolus 1,000 mL (0 mLs Intravenous Stopped 10/13/2017 1646)  0.9 %  sodium chloride infusion ( Intravenous Stopped 10/30/2017 1908)  piperacillin-tazobactam (ZOSYN) IVPB 3.375 g (0 g Intravenous Stopped 10/22/2017 1604)  sodium chloride 0.9 % bolus 1,000 mL (0 mLs Intravenous Stopped 10/29/2017 1806)  acetaminophen (TYLENOL) tablet 1,000 mg (1,000 mg Oral Given 10/13/2017 1647)  promethazine (PHENERGAN) injection 6.25 mg (6.25 mg Intravenous Given 10/09/2017 1739)  iopamidol (ISOVUE-300) 61 % injection 100 mL (100 mLs Intravenous Contrast Given 10/18/2017 1708)  promethazine (PHENERGAN) injection 6.25 mg (6.25 mg Intravenous Given 11/03/2017 1857)     Initial Impression / Assessment and Plan / ED Course  I have reviewed the triage vital signs and the nursing notes.  Pertinent labs & imaging results that were available during my care of the patient were reviewed by me and considered in my medical decision making (see chart for details).     71 yo F with complicated PMHx including recent surgical repair of hiatal hernia complicated by leaking G tube with intra-abd abscess, fungemia, and recent admission for SIRS here with nausea, vomiting since discharge on 11/1. On arrival, pt hypovolemic, ill appearing. Abdomen diffusely tender but without guarding. Concern for ongoing SIRS/sepsis, though I suspect there's a strong component of dehydration as well. Will start fluids, check labs.  UA + UTI. Pt given Zosyn due to ongoing abd pain, will check CT. Lab work shows persistent leukocytosis. Will f/u CT, plan to admit for sepsis of unknown source, UTI vs intra-abd infection. Pt and family in agreement. Zosyn, 30 cc/kg given.   Final Clinical Impressions(s) / ED Diagnoses   Final diagnoses:  Sepsis due to urinary tract infection ([place])  Dehydration  Other elevated white blood cell (WBC) count    ED Discharge Orders    None        Duffy Bruce, MD 11/03/2017 1913

## 2017-10-14 NOTE — ED Triage Notes (Signed)
Pt brought in by EMS from home, alert and oriented, c/o ongoing nausea, vomiting, denies pain. Pt / caregiver states patient has been unable to consume po, pt with recent PICC that was d/c, and patients urine output has decreased.

## 2017-10-14 NOTE — ED Provider Notes (Signed)
I assumed care of the patient from Dr. Ellender Hose.  On repeat exam the patient is awake and alert, states that she has some nausea, but no new complaints. She remains consistent with prior evaluation. Blood pressure remains consistently low, though with map greater than 65. I discussed the patient's CT findings with our radiologist, and subsequently with our general surgeon on call.  Surgery will follow as a consulting service (with anticipation of IR evaluation in the AM.) Patient was then admitted to our medicine team.   Carmin Muskrat, MD 11/02/2017 [phone]

## 2017-10-14 NOTE — ED Notes (Signed)
This writer attempt peripheral IV to R AC x1 and [place] IV to R upper arm x1; both attempts unsuccessful. IV team order in place.

## 2017-10-14 NOTE — ED Notes (Signed)
Patient refused rectal temp

## 2017-10-14 NOTE — ED Notes (Signed)
Bed: WA14 Expected date:  Expected time:  Means of arrival:  Comments: RES B 

## 2017-10-14 NOTE — Progress Notes (Signed)
Pharmacy Note   A consult was received from an ED physician for vancomycin per pharmacy dosing.   The patient's profile has been reviewed for ht/wt/allergies/indication/available labs.    A one time order has been placed for vancomycin 1000 mg IV x 1.  Further antibiotics/pharmacy consults should be ordered by admitting physician if indicated.                       Thank you,  Royetta Asal, PharmD, BCPS Pager [phone] 11/02/2017 7:16 PM

## 2017-10-14 NOTE — ED Notes (Signed)
Family requested to speak to charge nurse  Went in and spoke with family  Family concerned that they have not seen the admitting doctor and have questions regarding her admission  Spoke with Dr Vanessa Brazos he will come in and speak with family

## 2017-10-15 DIAGNOSIS — I959 Hypotension, unspecified: Secondary | ICD-10-CM

## 2017-10-15 DIAGNOSIS — Z8719 Personal history of other diseases of the digestive system: Secondary | ICD-10-CM

## 2017-10-15 DIAGNOSIS — Z888 Allergy status to other drugs, medicaments and biological substances status: Secondary | ICD-10-CM

## 2017-10-15 DIAGNOSIS — Z886 Allergy status to analgesic agent status: Secondary | ICD-10-CM

## 2017-10-15 DIAGNOSIS — R112 Nausea with vomiting, unspecified: Secondary | ICD-10-CM

## 2017-10-15 DIAGNOSIS — N189 Chronic kidney disease, unspecified: Secondary | ICD-10-CM

## 2017-10-15 DIAGNOSIS — K08109 Complete loss of teeth, unspecified cause, unspecified class: Secondary | ICD-10-CM

## 2017-10-15 DIAGNOSIS — Z8249 Family history of ischemic heart disease and other diseases of the circulatory system: Secondary | ICD-10-CM

## 2017-10-15 DIAGNOSIS — J90 Pleural effusion, not elsewhere classified: Secondary | ICD-10-CM

## 2017-10-15 DIAGNOSIS — Z833 Family history of diabetes mellitus: Secondary | ICD-10-CM

## 2017-10-15 DIAGNOSIS — Z882 Allergy status to sulfonamides status: Secondary | ICD-10-CM

## 2017-10-15 DIAGNOSIS — D733 Abscess of spleen: Secondary | ICD-10-CM

## 2017-10-15 DIAGNOSIS — D72828 Other elevated white blood cell count: Secondary | ICD-10-CM

## 2017-10-15 DIAGNOSIS — I82621 Acute embolism and thrombosis of deep veins of right upper extremity: Secondary | ICD-10-CM

## 2017-10-15 DIAGNOSIS — K5090 Crohn's disease, unspecified, without complications: Secondary | ICD-10-CM

## 2017-10-15 DIAGNOSIS — R634 Abnormal weight loss: Secondary | ICD-10-CM

## 2017-10-15 DIAGNOSIS — Z881 Allergy status to other antibiotic agents status: Secondary | ICD-10-CM

## 2017-10-15 DIAGNOSIS — Z87891 Personal history of nicotine dependence: Secondary | ICD-10-CM

## 2017-10-15 LAB — GLUCOSE, CAPILLARY
GLUCOSE-CAPILLARY: 104 mg/dL — AB (ref 65–99)
Glucose-Capillary: 90 mg/dL (ref 65–99)

## 2017-10-15 LAB — CBC WITH DIFFERENTIAL/PLATELET
Basophils Absolute: 0 10*3/uL (ref 0.0–0.1)
Basophils Relative: 0 %
EOS ABS: 0.3 10*3/uL (ref 0.0–0.7)
EOS PCT: 1 %
HCT: 25.3 % — ABNORMAL LOW (ref 36.0–46.0)
Hemoglobin: 8.2 g/dL — ABNORMAL LOW (ref 12.0–15.0)
LYMPHS ABS: 2.9 10*3/uL (ref 0.7–4.0)
Lymphocytes Relative: 13 %
MCH: 28.1 pg (ref 26.0–34.0)
MCHC: 32.4 g/dL (ref 30.0–36.0)
MCV: 86.6 fL (ref 78.0–100.0)
MONOS PCT: 11 %
Monocytes Absolute: 2.5 10*3/uL — ABNORMAL HIGH (ref 0.1–1.0)
Neutro Abs: 17 10*3/uL — ABNORMAL HIGH (ref 1.7–7.7)
Neutrophils Relative %: 75 %
PLATELETS: 359 10*3/uL (ref 150–400)
RBC: 2.92 MIL/uL — ABNORMAL LOW (ref 3.87–5.11)
RDW: 17.4 % — ABNORMAL HIGH (ref 11.5–15.5)
WBC: 22.7 10*3/uL — ABNORMAL HIGH (ref 4.0–10.5)

## 2017-10-15 LAB — COMPREHENSIVE METABOLIC PANEL
ALT: 25 U/L (ref 14–54)
AST: 37 U/L (ref 15–41)
Albumin: 2.1 g/dL — ABNORMAL LOW (ref 3.5–5.0)
Alkaline Phosphatase: 81 U/L (ref 38–126)
Anion gap: 10 (ref 5–15)
BUN: 13 mg/dL (ref 6–20)
CHLORIDE: 112 mmol/L — AB (ref 101–111)
CO2: 14 mmol/L — AB (ref 22–32)
CREATININE: 1.06 mg/dL — AB (ref 0.44–1.00)
Calcium: 7.4 mg/dL — ABNORMAL LOW (ref 8.9–10.3)
GFR calc Af Amer: >60 mL/min (ref >60)
GFR, EST NON AFRICAN AMERICAN: 52 mL/min — AB (ref >60)
Glucose, Bld: 95 mg/dL (ref 65–99)
Potassium: 3.1 mmol/L — ABNORMAL LOW (ref 3.5–5.1)
SODIUM: 136 mmol/L (ref 135–145)
Total Bilirubin: 0.6 mg/dL (ref 0.3–1.2)
Total Protein: 7.4 g/dL (ref 6.5–8.1)

## 2017-10-15 LAB — PROTIME-INR
INR: 1.52
PROTHROMBIN TIME: 18.1 s — AB (ref 11.4–15.2)

## 2017-10-15 LAB — HEMOGLOBIN AND HEMATOCRIT, BLOOD
HCT: 20.8 % — ABNORMAL LOW (ref 36.0–46.0)
HEMOGLOBIN: 6.8 g/dL — AB (ref 12.0–15.0)

## 2017-10-15 LAB — PREPARE RBC (CROSSMATCH)

## 2017-10-15 LAB — MAGNESIUM: Magnesium: 1.3 mg/dL — ABNORMAL LOW (ref 1.7–2.4)

## 2017-10-15 LAB — PHOSPHORUS: Phosphorus: 2.1 mg/dL — ABNORMAL LOW (ref 2.5–4.6)

## 2017-10-15 LAB — MRSA PCR SCREENING: MRSA BY PCR: NEGATIVE

## 2017-10-15 MED ORDER — ACETAMINOPHEN 650 MG RE SUPP
650.0000 mg | Freq: Four times a day (QID) | RECTAL | Status: DC | PRN
  Administered 2017-10-15 – 2017-10-20 (×2): 650 mg via RECTAL
  Filled 2017-10-15 (×2): qty 1

## 2017-10-15 MED ORDER — POTASSIUM CHLORIDE CRYS ER 20 MEQ PO TBCR
40.0000 meq | EXTENDED_RELEASE_TABLET | ORAL | Status: DC
  Filled 2017-10-15: qty 2

## 2017-10-15 MED ORDER — POTASSIUM CHLORIDE 10 MEQ/100ML IV SOLN
10.0000 meq | INTRAVENOUS | Status: AC
  Administered 2017-10-15 (×2): 10 meq via INTRAVENOUS
  Filled 2017-10-15 (×2): qty 100

## 2017-10-15 MED ORDER — LACTATED RINGERS IV SOLN
INTRAVENOUS | Status: DC
  Administered 2017-10-15: 08:00:00 via INTRAVENOUS

## 2017-10-15 MED ORDER — POTASSIUM CHLORIDE IN NACL 20-0.45 MEQ/L-% IV SOLN
INTRAVENOUS | Status: DC
  Administered 2017-10-15: 18:00:00 via INTRAVENOUS
  Filled 2017-10-15 (×3): qty 1000

## 2017-10-15 MED ORDER — ANIDULAFUNGIN 100 MG IV SOLR
100.0000 mg | INTRAVENOUS | Status: DC
Start: 2017-10-16 — End: 2017-11-05
  Administered 2017-10-16 – 2017-11-04 (×20): 100 mg via INTRAVENOUS
  Filled 2017-10-15 (×23): qty 100

## 2017-10-15 MED ORDER — MORPHINE SULFATE (PF) 4 MG/ML IV SOLN
0.5000 mg | INTRAVENOUS | Status: DC | PRN
  Administered 2017-10-15 – 2017-10-19 (×8): 0.52 mg via INTRAVENOUS
  Administered 2017-10-19: 0.5 mg via INTRAVENOUS
  Administered 2017-10-19 – 2017-10-20 (×3): 0.52 mg via INTRAVENOUS
  Filled 2017-10-15 (×12): qty 1

## 2017-10-15 MED ORDER — SODIUM CHLORIDE 0.9 % IV SOLN
Freq: Once | INTRAVENOUS | Status: AC
  Administered 2017-10-15: 22:00:00 via INTRAVENOUS

## 2017-10-15 MED ORDER — POTASSIUM PHOSPHATES 15 MMOLE/5ML IV SOLN
20.0000 mmol | Freq: Once | INTRAVENOUS | Status: AC
  Administered 2017-10-15: 20 mmol via INTRAVENOUS
  Filled 2017-10-15: qty 6.67

## 2017-10-15 MED ORDER — MAGNESIUM SULFATE 2 GM/50ML IV SOLN
2.0000 g | Freq: Once | INTRAVENOUS | Status: AC
  Administered 2017-10-15: 2 g via INTRAVENOUS
  Filled 2017-10-15: qty 50

## 2017-10-15 MED ORDER — SODIUM CHLORIDE 0.9 % IV SOLN
200.0000 mg | Freq: Once | INTRAVENOUS | Status: AC
  Administered 2017-10-15: 200 mg via INTRAVENOUS
  Filled 2017-10-15: qty 200

## 2017-10-15 MED ORDER — ACETAMINOPHEN 325 MG PO TABS: 650.0000 mg | ORAL_TABLET | Freq: Four times a day (QID) | ORAL | Status: DC | PRN

## 2017-10-15 MED ORDER — BOOST / RESOURCE BREEZE PO LIQD
1.0000 | Freq: Three times a day (TID) | ORAL | Status: DC
  Administered 2017-10-15 – 2017-10-16 (×4): 1 via ORAL

## 2017-10-15 MED ORDER — POTASSIUM CHLORIDE 2 MEQ/ML IV SOLN
INTRAVENOUS | Status: DC
  Administered 2017-10-15: 09:00:00 via INTRAVENOUS
  Filled 2017-10-15 (×2): qty 1000

## 2017-10-15 NOTE — Consult Note (Signed)
[facility]    Date of Admission:  10/31/2017           Day 2 vancomycin        Day 2 piperacillin tazobactam       Reason for Consult: Recurrent fever and mediastinal abscess    Referring Provider: Dr. Fayrene Helper  Assessment: She has developed a recurrent mediastinal and small perisplenic abscess complicating her recent gastric leak following hiatal hernia repair. She is at risk for polymicrobial infection and resistant pathogens. I will continue vancomycin and piperacillin tazobactam and resume anidulafungin now. I have asked the IV team nurse to hold off on PICC placement until we know blood cultures are negative for at least 48 hours.   Plan: 1. Continue vancomycin and piperacillin tazobactam for now 2. Start anidulafungin 3. Hold off on PICC placement pending blood culture results 4. I will follow-up tomorrow   Principal Problem:   Abscess of mediastinum ([place]) Active Problems:   Leukocytosis   Hypotension   UTI (urinary tract infection)   Nausea and vomiting   DVT (deep venous thrombosis) (H[facility])   Scheduled Meds: . sodium bicarbonate  650 mg Oral BID   Continuous Infusions: . 0.45 % NaCl with KCl 20 mEq / L    . piperacillin-tazobactam (ZOSYN)  IV 3.375 g (10/15/17 1456)  . potassium chloride    . potassium PHOSPHATE IVPB (mmol) 20 mmol (10/15/17 1157)  . [START ON 10/16/2017] vancomycin     PRN Meds:.acetaminophen **OR** acetaminophen, morphine injection, nystatin, promethazine  HPI: Danielle [name] is a 71 y.o. female who was admitted on 09/08/2017 and underwent laparoscopic gastropexy for hiatal hernia. She also had extensive lysis of adhesions. Postoperatively she developed sepsis with tachycardia and hypotension. CT scan was performed on 09/09/2017 which showed fluid and air in the mediastinum. She underwent exploratory laparotomy which revealed purulent peritonitis and leaking around her gastrostomy tube. This was  repaired. She developed acute on chronic renal insufficiency. She was placed on broad empiric antibiotic therapy. She had Candida glabrata fungemia. Her pelvic abscesses were drained and showed Candida glabrata and Candida albicans. She was treated with piperacillin tazobactam and anidulafungin. Piperacillin tazobactam was changed to ertapenem upon discharge on 09/26/2017 and anidulafungin was continued.  She was readmitted on 10/01/2017 with fever and leukocytosis. CT scan showed resolution of her pelvic abscesses. Blood cultures were negative. She was found to have a right axillary vein DVT above her PICC site. The PICC was removed and her antibiotics were stopped. She was discharged on 10/07/2017.  She was readmitted yesterday after developing nausea and vomiting at home. She had a fever of 102.9 this morning. Repeat CT scan shows reaccumulation of a 3 x 8 cm abscess in the lower mediastinum wrapping around her thickened stomach and esophagus. She also has a very small 1.3 x 3.3 cm peri-splenic abscess and small bilateral pleural effusions, left greater than right. Blood cultures are negative at 24 hours. She was started on vancomycin and piperacillin tazobactam upon admission. She is feeling a little bit better today with less nausea and vomiting.  Review of Systems: Review of Systems  Constitutional: Positive for chills, fever, malaise/fatigue and weight loss. Negative for diaphoresis.       She has had a 10 pound unintentional weight loss over the past month.  HENT: Negative for congestion and sore throat.   Respiratory: Negative for cough, sputum production and shortness of breath.   Cardiovascular: Negative  for chest pain.  Gastrointestinal: Positive for diarrhea, nausea and vomiting. Negative for abdominal pain, blood in stool and heartburn.       She has had loose stools but this is not much different than her chronic pattern due to her Crohn's.  Genitourinary: Negative for dysuria.    Musculoskeletal: Negative for back pain and joint pain.  Skin: Negative for rash.  Neurological: Positive for weakness. Negative for dizziness and headaches.    Past Medical History:  Diagnosis Date  . Crohn disease ([place])   . Hiatal hernia 12/07/2015  . Left buttock abscess 04/21/2017  . Psoriasis   . SBO (small bowel obstruction) ([place]) 07/27/2017    Social History   Tobacco Use  . Smoking status: Former Smoker    Packs/day: 0.12    Years: 4.00    Pack years: 0.48    Types: Cigarettes    Last attempt to quit: 10/05/1996    Years since quitting: 21.0  . Smokeless tobacco: Never Used  . Tobacco comment: 08/22/2013 1 pack every two weeks when she did smoked  Substance Use Topics  . Alcohol use: No    Alcohol/week: 0.0 oz  . Drug use: No    Family History  Problem Relation Age of Onset  . Diabetes Mother   . Heart disease Father   . Healthy Sister   . Hypertension Brother   . Colon cancer Brother        died with colon cancer  . Crohn's disease Paternal Aunt    Allergies  Allergen Reactions  . Zithromax [Azithromycin] Swelling and Other (See Comments)    Mouth swelling and blisters   . Aspirin Other (See Comments)    Stomach cramping   . Ibuprofen Other (See Comments)    Pt states that she prefers not to take this medication.   . Iron Nausea And Vomiting and Other (See Comments)    FERROUS SULFATE  . Sulfa Antibiotics Nausea And Vomiting    OBJECTIVE: Blood pressure 102/64, pulse (!) 110, temperature (!) 102.9 F (39.4 C), temperature source Oral, resp. rate (!) 30, height 5' 4.5" (1.638 m), weight 123 lb 3.8 oz (55.9 kg), SpO2 100 %.  Physical Exam  Constitutional: She is oriented to person, place, and time. No distress.  She is thin and appears frail but she is in good spirits.  HENT:  Mouth/Throat: No oropharyngeal exudate.  Edentulous.  Eyes: Conjunctivae are normal.  Cardiovascular: Normal rate and regular rhythm.  No murmur heard. Pulmonary/Chest:  Effort normal and breath sounds normal. She has no wheezes. She has no rales.  Abdominal: Soft. She exhibits no distension and no mass. There is tenderness.  She has a clean, dry gauze dressing over her midline incision. She has mild midabdominal pain with palpation.  Neurological: She is alert and oriented to person, place, and time.  Skin: No rash noted.  Psychiatric: Mood and affect normal.    Lab Results Lab Results  Component Value Date   WBC 22.7 (H) 10/15/2017   HGB 8.2 (L) 10/15/2017   HCT 25.3 (L) 10/15/2017   MCV 86.6 10/15/2017   PLT 359 10/15/2017    Lab Results  Component Value Date   CREATININE 1.06 (H) 10/15/2017   BUN 13 10/15/2017   NA 136 10/15/2017   K 3.1 (L) 10/15/2017   CL 112 (H) 10/15/2017   CO2 14 (L) 10/15/2017    Lab Results  Component Value Date   ALT 25 10/15/2017   AST 37 10/15/2017  ALKPHOS 81 10/15/2017   BILITOT 0.6 10/15/2017     Microbiology: Recent Results (from the past 240 hour(s))  Culture, blood (Routine x 2)     Status: None (Preliminary result)   Collection Time: 10/31/2017  2:05 PM  Result Value Ref Range Status   Specimen Description BLOOD LEFT FOREARM  Final   Special Requests   Final    BOTTLES DRAWN AEROBIC AND ANAEROBIC Blood Culture results may not be optimal due to an inadequate volume of blood received in culture bottles   Culture   Final    NO GROWTH < 24 HOURS Performed at [facility], [place] [address], [place], [place] [address]    Report Status PENDING  Incomplete  MRSA PCR Screening     Status: None   Collection Time: 10/22/2017 10:45 PM  Result Value Ref Range Status   MRSA by PCR NEGATIVE NEGATIVE Final    Comment:        The GeneXpert MRSA Assay (FDA approved for NASAL specimens only), is one component of a comprehensive MRSA colonization surveillance program. It is not intended to diagnose MRSA infection nor to guide or monitor treatment for MRSA infections.     Michel Bickers, MD [facility] [phone] pager   [phone] cell 10/15/2017, 4:37 PM

## 2017-10-15 NOTE — Progress Notes (Signed)
IR aware of request for possible mediastinal drain for this patient.  Dr. Earleen Newport has reviewed her imaging.  He does not feel she is a candidate for percutaneous drain placement.    His recommendation would be CT surgery consult.  If a thora is felt to be warranted, then that could be ordered.  I have left a message for Dr. Rosendo Gros with this information.    Kennett Symes E 8:50 AM 10/15/2017

## 2017-10-15 NOTE — Progress Notes (Signed)
[facility] NOTE   Pharmacy Consult for TPN Indication: unable to take po  Patient Measurements: Height: 5' 4.5" (163.8 cm) Weight: 130 lb (59 kg) IBW/kg (Calculated) : 55.85 TPN AdjBW (KG): 59 Body mass index is 21.97 kg/m.  Central access:  TPN start date:   ASSESSMENT                                                                                                          HPI: 82 yoF re-admit 11/8 with N/V, loose stools (baseline w/Crohn's). Recurrent mediastinal fluid abscess, possible splenic abscess on CT PMH: Crohns, HH, hx SBO Significant events: 10/3 - 10/21 admit for [facility] repair, ICU post-op on vent & pressors, extubated POD2, was on TPN, pelvic fluid collection-C albicans & C glabrata tx with Eraxis & Ertapenem x 2 wk total 10/26 admit with fevers, tx with Zosyn  Significant events:  11/9: repletion of electrolytes Magnesium, Phosphorus, KCl  Today:   Glucose - wnl,   Electrolytes - K, Mag, Phos require repletion, NaBicarb tabs ordered  Renal -wnl  LFTs - wnl  TGs - tomorrow  Prealbumin - tomorrow  Insulin Requirements: no hx DM, assess need for insulin coverage Added q6hr CBGs  Current Nutrition: NPO  IVF: 1/2 NS with 20 mEq KCL/L at 100 ml/hr   NUTRITIONAL GOALS                                                                                             RD recs: 60-70 gm protein, 1475-1170 kCal per 24 hr  Clinimix 5/15 at a goal rate of 55 ml/hr + 20% fat emulsion at 71ml/hr to provide: 66 g/day protein, 1465 Kcal/day.  PLAN                                                                                                                         At 1800 today: or after PICC line placed    Start Clinimix E 5/15 at 67ml/hr.  20% fat emulsion at 76ml/hr.  Plan to advance as tolerated to the goal rate.  TPN to contain standard multivitamins and trace elements.  Reduce IVF to  73ml/hr.  Consider need for SSI  based on CBGs  TPN lab panels on Mondays & Thursdays.  F/u daily.  Minda Ditto PharmD Pager [phone] 10/15/2017, 3:05 PM

## 2017-10-15 NOTE — Progress Notes (Signed)
Pharmacy Note  Pharmacy was consulted to start TPN.  However with PICC line placement cancellation, will hold off on starting TPN today.  Royetta Asal, PharmD, BCPS Pager [phone] 10/15/2017 7:56 PM

## 2017-10-15 NOTE — Progress Notes (Signed)
Patients BP 74/42 upon arrival to ICU.  Physician notified.  One time bolus ordered.  Physician advises that patient in normally Hypotensive and Physician should be notified for Systolic < 80

## 2017-10-15 NOTE — Progress Notes (Signed)
Subjective/Chief Complaint: States has been having some intolerance to PO. Diarrhea at home Min mobility   Objective: Vital signs in last 24 hours: Temp:  [96.4 F (35.8 C)-97.8 F (36.6 C)] 97.8 F (36.6 C) (11/09 0400) Pulse Rate:  [90-118] 107 (11/09 0100) Resp:  [16-39] 30 (11/09 0600) BP: (79-131)/(44-86) 99/53 (11/09 0600) SpO2:  [90 %-100 %] 99 % (11/09 0100) Weight:  [59 kg (130 lb)] 59 kg (130 lb) (11/08 1248)    Intake/Output from previous day: 11/08 0701 - 11/09 0700 In: 3286.7 [I.V.:720; IV Piggyback:2566.7] Out: -  Intake/Output this shift: No intake/output data recorded.  General appearance: alert and cooperative GI: soft, non-tender; bowel sounds normal; no masses,  no organomegaly  Lab Results:  Recent Labs    11/03/2017 1405 10/15/17 0400  WBC 23.0* 22.7*  HGB 12.3 8.2*  HCT 37.5 25.3*  PLT 276 359   BMET Recent Labs    11/03/2017 1405 10/15/17 0400  NA 133* 136  K 3.5 3.1*  CL 103 112*  CO2 18* 14*  GLUCOSE 84 95  BUN 17 13  CREATININE 1.29* 1.06*  CALCIUM 8.3* 7.4*   PT/INR Recent Labs    10/10/2017 1405 10/15/17 0400  LABPROT 19.7* 18.1*  INR 1.69 1.52   ABG No results for input(s): PHART, HCO3 in the last 72 hours.  Invalid input(s): PCO2, PO2  Studies/Results: Dg Chest 2 View  Result Date: 10/10/2017 CLINICAL DATA:  Dyspnea and possible sepsis. EXAM: CHEST  2 VIEW COMPARISON:  10/05/2017 CT chest.  09/2028 CXR. FINDINGS: Borderline cardiomegaly with minimal aortic atherosclerosis. There has been interval decrease in left-sided pleural effusion now small to moderate. Probable compressive atelectasis at the left lung base accounting for retrocardiac homogeneous opacity that still persists. Mild central vascular congestion is again re- demonstrated. No acute osseous abnormality. Chronic right-sided rib fractures. The patient's chin obscures the lung apices. Left-sided PICC line has been removed in the interim. IMPRESSION: 1.  Borderline cardiomegaly with interval decrease in left pleural effusion. Persistent presumed compressive atelectasis at the left lung base and mild central vascular congestion. 2. Mild aortic atherosclerosis. Electronically Signed   By: Ashley Royalty M.D.   On: 11/01/2017 14:58   Ct Abdomen Pelvis W Contrast  Result Date: 10/23/2017 CLINICAL DATA:  Nausea, vomiting.  Decreased urine output. EXAM: CT ABDOMEN AND PELVIS WITH CONTRAST TECHNIQUE: Multidetector CT imaging of the abdomen and pelvis was performed using the standard protocol following bolus administration of intravenous contrast. CONTRAST:  153mL ISOVUE-300 IOPAMIDOL (ISOVUE-300) INJECTION 61% COMPARISON:  CT abdomen dated 10/01/2017. FINDINGS: Lower chest: Bibasilar pleural effusions, left greater than right, incompletely imaged, with associated atelectasis. Interval reaccumulation of the fluid collection in the lower mediastinum, surrounding the herniated stomach, measuring approximately 8 cm transverse dimension by 3 cm greatest thickness, craniocaudal dimension incompletely imaged. This collection is now similar to the size seen on CT abdomen of 09/16/2017, when a drainage catheter was in place. Hepatobiliary: No focal liver abnormality is seen. Status post cholecystectomy. No biliary dilatation. Pancreas: Unremarkable. No pancreatic ductal dilatation or surrounding inflammatory changes. Spleen: Small complex collection along the posterior margin of the spleen, possibly abscess, measuring 3.3 x 1.3 cm. Spleen otherwise unremarkable. Adrenals/Urinary Tract: Adrenal glands appear normal. Kidneys are unremarkable without mass, stone or hydronephrosis. No ureteral or bladder calculi. Bladder is unremarkable. Stomach/Bowel: No dilated large or small bowel loops. No convincing evidence of bowel wall thickening or bowel wall inflammation within the abdomen or pelvis. There is at least some thickening/inflammation  of the walls of the herniated stomach and  lower esophagus, again incompletely imaged, likely reactive to the adjacent fluid collection. Vascular/Lymphatic: No significant vascular findings are present. No enlarged abdominal or pelvic lymph nodes. Reproductive: No adnexal mass or free fluid. Other: No free intraperitoneal air. Musculoskeletal: No acute or suspicious osseous finding. IMPRESSION: 1. Reaccumulation of the previously demonstrated fluid collection in the lower mediastinum, presumably abscess, likely multiloculated, surrounding the herniated portion of the stomach and lower esophagus, measuring 8 x 3 cm (transverse by AP dimensions), craniocaudal extent incompletely imaged. The size of the fluid collection is now similar to the size seen on CT abdomen of 09/16/2017, at which point there were drainage catheters in place. 2. Thickening of the walls of the herniated portion of the stomach and the overlying esophagus, incompletely imaged, likely reactive to the adjacent fluid collection/abscess. 3. Bibasilar pleural effusions, left greater than right, incompletely imaged, with associated atelectasis. 4. Small complex collection along the posterior margin of the spleen, possibly additional small abscess, measuring 3.3 x 1.3 cm. 5. No evidence of bowel obstruction within the abdomen or pelvis. Recommend chest CT to determine the entire craniocaudal extent of the mediastinal fluid collection. These results were called by telephone at the time of interpretation on 10/26/2017 at 5:45 pm to Dr. Vanita Panda, who verbally acknowledged these results. Electronically Signed   By: Franki Cabot M.D.   On: 10/30/2017 17:51    Anti-infectives: Anti-infectives (From admission, onward)   Start     Dose/Rate Route Frequency Ordered Stop   10/16/17 0600  vancomycin (VANCOCIN) IVPB 1000 mg/200 mL premix     1,000 mg 200 mL/hr over 60 Minutes Intravenous Every 36 hours 10/29/2017 2308     10/23/2017 2330  piperacillin-tazobactam (ZOSYN) IVPB 3.375 g  Status:   Discontinued     3.375 g 100 mL/hr over 30 Minutes Intravenous Every 8 hours 10/26/2017 2257 10/29/2017 2259   10/25/2017 2315  piperacillin-tazobactam (ZOSYN) IVPB 3.375 g     3.375 g 12.5 mL/hr over 240 Minutes Intravenous Every 8 hours 10/15/2017 2300     10/28/2017 1930  vancomycin (VANCOCIN) IVPB 1000 mg/200 mL premix     1,000 mg 200 mL/hr over 60 Minutes Intravenous  Once 10/26/2017 1915 10/11/2017 2044   10/24/2017 1530  piperacillin-tazobactam (ZOSYN) IVPB 3.375 g     3.375 g 100 mL/hr over 30 Minutes Intravenous  Once 11/02/2017 1529 11/01/2017 1604      Assessment/Plan: Mediastinal fluid collection 1.  Will consult IR to assess whether or no mediastinal drain is possible.  I think relieving the fluid in her mediastinum may help with her intol to PO. 2. Consulted Pharm to start TNA as pt has had min PO intake in last several weeks. 3. Abx per ID. 4. Will follow along.   LOS: 1 day    [name]., [facility] 10/15/2017

## 2017-10-15 NOTE — Progress Notes (Signed)
Initial Nutrition Assessment  DOCUMENTATION CODES:   (Will assess for malnutrition at follow-up)  INTERVENTION:  - TPN initiation and advancement per Pharmacy. - Diet advancement as medically feasible.  Monitor magnesium, potassium, and phosphorus daily for at least 3 days, MD to replete as needed, as pt is at risk for refeeding syndrome given current hypokalemia, hypomagnesemia, and hypophosphatemia with need for repletion.   NUTRITION DIAGNOSIS:   Inadequate oral intake related to inability to eat as evidenced by NPO status.  GOAL:   Patient will meet greater than or equal to 90% of their needs  MONITOR:   Diet advancement, Weight trends, Labs, I & O's, Other (Comment)(TPN regimen)  REASON FOR ASSESSMENT:   Consult New TPN/TNA  ASSESSMENT:   71 y.o. female with medical history significant of Crohn's disease, hiatal hernia, left buttock abscess, psoriasis, small bowel obstruction who has been admitted twice in the past 5 weeks, first from 10/03-10/21 after undergoing type IV hiatal hernia repair. She improved clinically and was on TPN, however she developed a pelvic fluid collection which required a drain placement. She subsequently improved and was weaned off of TPN and was tolerating an oral diet. ID recommended to continue Invanz and Erasis for 2 weeks. A PICC line was placed in her right upper arm and she was subsequently discharged home. She returned on 10/26 due to intermittent fevers. She presented on 11/8 with complaints of persistent nausea associated with about 3 episodes of emesis daily. She has also had loose stools, but stated that this is her baseline since she suffers from Crohn's disease.   RN alerted RD to plan for TPN initiation. RD talked with Pharmacist who confirmed plan. When RD talked with RN and Pharmacist mid-morning they both reported that PICC placement consult was not yet ordered. Pharmacist reported hypokalemia, hypomagnesemia, and hypophosphatemia and  that she had ordered repletion for these.   No family/visitors present. Pt requested that RD stop back ~30 minutes after initially attempted visit. When RD returned in ~45 minutes pt was still very drowsy, stating that she had a rough night and that RN had told her she is supposed to just rest today. RD was able to obtain the following information from pt; some information does not align with information from other notes in the chart and RD is unsure if this is because of pt's drowsiness or if she is confused today.   Pt reports that last episode of emesis was this AM and that it was "clear water." Pt confirms emesis was like saliva. She denies abdominal pain or nausea at this time but states she has chronic abdominal pain 2/2 hx of Crohn's disease. When asked if any foods are irritants for her because of Crohn's she states "I don't know, I don't eat anything that I know will bother it." Pt is unable to elaborate on this or give examples of such foods or drinks. She denies nausea PTA. She states that emesis began yesterday evening, shortly before coming to the ED, but denies emesis at home in the days or weeks leading up to this admission. She denies any difficulties with swallowing.  Per chart review, weight has fluctuated several times over the past 3 months.  10/16/2017 130 lb (59 kg)  10/02/17 149 lb 14.6 oz (68 kg)  09/25/17 141 lb 8.6 oz (64.2 kg)  09/02/17 133 lb 6 oz (60.5 kg)  08/26/17 130 lb (59 kg)  07/28/17 145 lb 2 oz (65.8 kg)  07/07/17 125 lb (56.7 kg)  Pharmacy note has not yet been signed. Recommend goal for TPN:  Clinimix E 5/15 @ 65 mL/hr with 20% ILE @ 20 mL/hr to provide 78 grams of protein and 1588 kcal.    Medications reviewed; 2 g IV Mg sulfate x1 run today, 20 mmol IV KPhos x1 run today, 650 mg oral sodium bicarb BID, Labs reviewed; K: 3.1 mmol/L, Phos: 2.1 mg/dL, Mg: 1.3 mg/dL, Cl: 112 mmol/L, creatinine: 1.06 mg/dL, Ca: 7.4 mg/dL.  IVF: LR@ 100 mL/hr.   NUTRITION -  FOCUSED PHYSICAL EXAM: Pt refused at this time. Will attempt to perform/assess at follow-up. NFPE was last done on 09/10/17 and showed no muscle or fat wasting at that time.    Diet Order:  Diet NPO time specified  EDUCATION NEEDS:   Not appropriate for education at this time  Skin:     Last BM:  PTA/unknown  Height:   Ht Readings from Last 1 Encounters:  11/04/2017 5' 4.5" (1.638 m)    Weight:   Wt Readings from Last 1 Encounters:  10/18/2017 130 lb (59 kg)    Ideal Body Weight:  55.68 kg  BMI:  Body mass index is 21.97 kg/m.  Estimated Nutritional Needs:   Kcal:  1475-1770 (25-30 kcal/kg)  Protein:  60-70 grams (1-1.2 grams/kg)  Fluid:  >/= 1.6 L/day      Jarome Matin, MS, RD, LDN, [facility] Inpatient Clinical Dietitian Pager # [phone] After hours/weekend pager # [phone]

## 2017-10-15 NOTE — Progress Notes (Addendum)
PROGRESS NOTE    Danielle [name]  HYQ:[id number] DOB: [birth year] DOA: 10/12/2017 PCP: Iona Beard, MD    Brief Narrative:  Danielle [name] is Danielle [name] 71 y.o. female with medical history significant of Crohn's disease, hiatal hernia, left buttock abscess, psoriasis, small bowel obstruction who has been recently admitted in the discharge twice in the past 5 weeks, first from 09/08/2017 to 09/26/2017 after undergoing type IV hiatal hernia repair.  Postoperatively the patient required endotracheal intubation, mechanical ventilation and the use of pressors.  She was extubated on postop day #2.  She improved clinically and was on parenteral nutrition, however she developed Emmerich Cryer pelvic fluid collection which require Roshawnda Pecora drain placement.  On 09/11/2017 the patient blood cultures were positive for Candida glabrata and abscess culture were positive for C. Albicans and C. Glabrata. She was given treatment with parenteral antifungals. She subsequently improved was able to ambulate with physical therapy, weaned off of parenteral nutrition and was tolerating oral diet. ID recommended to continue Invanz and Erasis for 2 weeks. Eugene Zeiders PICC line was placed in her right upper arm and she was subsequently discharged home.  She returned on 10/01/2017 due to intermittent fevers.  She was placed on the neck slowly and cultures were obtained.  Antibiotics were discontinue 24 hours prior to discharge and the patient remained afebrile and clinically improved.  However, she returns today with complaints of persistent nausea associated with about 3 episodes of emesis daily.  She has also had loose stools, but stated that this is her baseline since she suffers from Crohn's disease. She denies fever, chills, rhinorrhea, sore throat, productive cough, dyspnea, chest pain, palpitations, dizziness, diaphoresis, PND, orthopnea or pitting edema of the lower extremities. She denies constipation, melena or hematochezia.  She denies dysuria, frequency or  hematuria.  Denies polyuria, polydipsia or blurred vision.  Assessment & Plan:   Principal Problem:   Abscess of mediastinum (HCC) Active Problems:   Leukocytosis   Hypotension   UTI (urinary tract infection)   Nausea and vomiting   DVT (deep venous thrombosis) (HCC)   Sepsis 2/2 Mediastinal Abscess:  CT with "reaccumulation of fluid collection in lower mediastinum" Also possible small abscess along posterior margin of spleen. Thickening of herniated portion of stomach and esopagus, likely reactive [ ]  consider CT chest Continue zosyn/vanc IVF IR consult CT surgery Blood cultures (fluid cultures from drainage when done) ID c/s  Pyuria:  Urine cx pending Zosyn as above  Hypotension  Tachycardia:  2/2 above BP improved Goal MAPs >65 Continue IVF  Leukocytosis 2/2 above  Decreased PO intake  Nausea Vomiting:  Likely 2/2 above Surgery has c/s pharm for TPA  Hypokalemia: F/u mag Replete prn  NAGMA:  2/2 NS administration, looks to be chronic as well on sodium bicarb. Fluids to LR Bicarb  HTN: holding home spironolactone  RUE DVT: holding eliquis in preparation for possible procedure  Anemia: H/H appears close to baseline, but dropped from 12.3 last night.  Suspect dilution vs spurious result.  Will check afternoon H/H.    History of crohns   DVT prophylaxis: SCD Code Status: full Family Communication: none Disposition Plan: pending improvement   Consultants:   Surgery, IR  Procedures: (Don't include imaging studies which can be auto populated. Include things that cannot be auto populated i.e. Echo, Carotid and venous dopplers, Foley, Bipap, HD, tubes/drains, wound vac, central lines etc)  pending  Antimicrobials: (specify start and planned stop date. Auto populated tables are space occupying and do not give  end dates) Anti-infectives (From admission, onward)   Start     Dose/Rate Route Frequency Ordered Stop   10/16/17 0600  vancomycin  (VANCOCIN) IVPB 1000 mg/200 mL premix     1,000 mg 200 mL/hr over 60 Minutes Intravenous Every 36 hours 10/17/2017 2308     10/28/2017 2330  piperacillin-tazobactam (ZOSYN) IVPB 3.375 g  Status:  Discontinued     3.375 g 100 mL/hr over 30 Minutes Intravenous Every 8 hours 10/11/2017 2257 10/07/2017 2259   10/24/2017 2315  piperacillin-tazobactam (ZOSYN) IVPB 3.375 g     3.375 g 12.5 mL/hr over 240 Minutes Intravenous Every 8 hours 10/13/2017 2300     10/17/2017 1930  vancomycin (VANCOCIN) IVPB 1000 mg/200 mL premix     1,000 mg 200 mL/hr over 60 Minutes Intravenous  Once 10/27/2017 1915 10/31/2017 2044   11/02/2017 1530  piperacillin-tazobactam (ZOSYN) IVPB 3.375 g     3.375 g 100 mL/hr over 30 Minutes Intravenous  Once 10/16/2017 1529 10/11/2017 1604         Subjective: 3 weeks decreased appetite.  Can't keep anything down. Denies pain.  No fevers.  She has had vomiting as recently as this morning.  Objective: Vitals:   10/15/17 0300 10/15/17 0400 10/15/17 0500 10/15/17 0600  BP: (!) 95/49 (!) 97/49 (!) 101/44 (!) 99/53  Pulse:      Resp: 20 (!) 39 (!) 29 (!) 30  Temp:  97.8 F (36.6 C)    TempSrc:  Oral    SpO2:      Weight:      Height:        Intake/Output Summary (Last 24 hours) at 10/15/2017 0745 Last data filed at 10/15/2017 0600 Gross per 24 hour  Intake 3286.68 ml  Output -  Net 3286.68 ml   Filed Weights   10/23/2017 1248  Weight: 59 kg (130 lb)    Examination:  General exam: Appears calm and comfortable  Respiratory system: Clear to auscultation. Respiratory effort normal. Cardiovascular system: S1 & S2 heard, RRR. Tachycardic.  No JVD, murmurs, rubs, gallops or clicks. No pedal edema. Gastrointestinal system: Abdomen is nondistended, soft and mildly tender. No organomegaly or masses felt. Normal bowel sounds heard.  Midline abdominal dressing. Central nervous system: Alert and oriented. No focal neurological deficits. Extremities: Symmetric 5 x 5 power. Skin: No rashes,  lesions or ulcers Psychiatry: Judgement and insight appear normal. Mood & affect appropriate.     Data Reviewed: I have personally reviewed following labs and imaging studies  CBC: Recent Labs  Lab 11/04/2017 1405 10/15/17 0400  WBC 23.0* 22.7*  NEUTROABS 14.3* 17.0*  HGB 12.3 8.2*  HCT 37.5 25.3*  MCV 85.2 86.6  PLT 276 416   Basic Metabolic Panel: Recent Labs  Lab 10/28/2017 1405 10/15/17 0400  NA 133* 136  K 3.5 3.1*  CL 103 112*  CO2 18* 14*  GLUCOSE 84 95  BUN 17 13  CREATININE 1.29* 1.06*  CALCIUM 8.3* 7.4*   GFR: Estimated Creatinine Clearance: 44.2 mL/min (Leslee Haueter) (by C-G formula based on SCr of 1.06 mg/dL (H)). Liver Function Tests: Recent Labs  Lab 10/23/2017 1405 10/15/17 0400  AST 57* 37  ALT 34 25  ALKPHOS 95 81  BILITOT 1.1 0.6  PROT 8.3* 7.4  ALBUMIN 2.3* 2.1*   Recent Labs  Lab 10/28/2017 1405  LIPASE 34   No results for input(s): AMMONIA in the last 168 hours. Coagulation Profile: Recent Labs  Lab 10/22/2017 1405 10/15/17 0400  INR 1.69 1.52  Cardiac Enzymes: No results for input(s): CKTOTAL, CKMB, CKMBINDEX, TROPONINI in the last 168 hours. BNP (last 3 results) No results for input(s): PROBNP in the last 8760 hours. HbA1C: No results for input(s): HGBA1C in the last 72 hours. CBG: No results for input(s): GLUCAP in the last 168 hours. Lipid Profile: No results for input(s): CHOL, HDL, LDLCALC, TRIG, CHOLHDL, LDLDIRECT in the last 72 hours. Thyroid Function Tests: No results for input(s): TSH, T4TOTAL, FREET4, T3FREE, THYROIDAB in the last 72 hours. Anemia Panel: No results for input(s): VITAMINB12, FOLATE, FERRITIN, TIBC, IRON, RETICCTPCT in the last 72 hours. Sepsis Labs: Recent Labs  Lab 11/02/2017 1420  LATICACIDVEN 1.78    Recent Results (from the past 240 hour(s))  MRSA PCR Screening     Status: None   Collection Time: 10/17/2017 10:45 PM  Result Value Ref Range Status   MRSA by PCR NEGATIVE NEGATIVE Final    Comment:          The GeneXpert MRSA Assay (FDA approved for NASAL specimens only), is one component of Shailyn Weyandt comprehensive MRSA colonization surveillance program. It is not intended to diagnose MRSA infection nor to guide or monitor treatment for MRSA infections.          Radiology Studies: Dg Chest 2 View  Result Date: 10/20/2017 CLINICAL DATA:  Dyspnea and possible sepsis. EXAM: CHEST  2 VIEW COMPARISON:  10/05/2017 CT chest.  09/2028 CXR. FINDINGS: Borderline cardiomegaly with minimal aortic atherosclerosis. There has been interval decrease in left-sided pleural effusion now small to moderate. Probable compressive atelectasis at the left lung base accounting for retrocardiac homogeneous opacity that still persists. Mild central vascular congestion is again re- demonstrated. No acute osseous abnormality. Chronic right-sided rib fractures. The patient's chin obscures the lung apices. Left-sided PICC line has been removed in the interim. IMPRESSION: 1. Borderline cardiomegaly with interval decrease in left pleural effusion. Persistent presumed compressive atelectasis at the left lung base and mild central vascular congestion. 2. Mild aortic atherosclerosis. Electronically Signed   By: Ashley Royalty M.D.   On: 10/20/2017 14:58   Ct Abdomen Pelvis W Contrast  Result Date: 10/31/2017 CLINICAL DATA:  Nausea, vomiting.  Decreased urine output. EXAM: CT ABDOMEN AND PELVIS WITH CONTRAST TECHNIQUE: Multidetector CT imaging of the abdomen and pelvis was performed using the standard protocol following bolus administration of intravenous contrast. CONTRAST:  127mL ISOVUE-300 IOPAMIDOL (ISOVUE-300) INJECTION 61% COMPARISON:  CT abdomen dated 10/01/2017. FINDINGS: Lower chest: Bibasilar pleural effusions, left greater than right, incompletely imaged, with associated atelectasis. Interval reaccumulation of the fluid collection in the lower mediastinum, surrounding the herniated stomach, measuring approximately 8 cm transverse  dimension by 3 cm greatest thickness, craniocaudal dimension incompletely imaged. This collection is now similar to the size seen on CT abdomen of 09/16/2017, when Aiman Sonn drainage catheter was in place. Hepatobiliary: No focal liver abnormality is seen. Status post cholecystectomy. No biliary dilatation. Pancreas: Unremarkable. No pancreatic ductal dilatation or surrounding inflammatory changes. Spleen: Small complex collection along the posterior margin of the spleen, possibly abscess, measuring 3.3 x 1.3 cm. Spleen otherwise unremarkable. Adrenals/Urinary Tract: Adrenal glands appear normal. Kidneys are unremarkable without mass, stone or hydronephrosis. No ureteral or bladder calculi. Bladder is unremarkable. Stomach/Bowel: No dilated large or small bowel loops. No convincing evidence of bowel wall thickening or bowel wall inflammation within the abdomen or pelvis. There is at least some thickening/inflammation of the walls of the herniated stomach and lower esophagus, again incompletely imaged, likely reactive to the adjacent fluid collection. Vascular/Lymphatic: No  significant vascular findings are present. No enlarged abdominal or pelvic lymph nodes. Reproductive: No adnexal mass or free fluid. Other: No free intraperitoneal air. Musculoskeletal: No acute or suspicious osseous finding. IMPRESSION: 1. Reaccumulation of the previously demonstrated fluid collection in the lower mediastinum, presumably abscess, likely multiloculated, surrounding the herniated portion of the stomach and lower esophagus, measuring 8 x 3 cm (transverse by AP dimensions), craniocaudal extent incompletely imaged. The size of the fluid collection is now similar to the size seen on CT abdomen of 09/16/2017, at which point there were drainage catheters in place. 2. Thickening of the walls of the herniated portion of the stomach and the overlying esophagus, incompletely imaged, likely reactive to the adjacent fluid collection/abscess. 3.  Bibasilar pleural effusions, left greater than right, incompletely imaged, with associated atelectasis. 4. Small complex collection along the posterior margin of the spleen, possibly additional small abscess, measuring 3.3 x 1.3 cm. 5. No evidence of bowel obstruction within the abdomen or pelvis. Recommend chest CT to determine the entire craniocaudal extent of the mediastinal fluid collection. These results were called by telephone at the time of interpretation on 10/23/2017 at 5:45 pm to Dr. Vanita Panda, who verbally acknowledged these results. Electronically Signed   By: Franki Cabot M.D.   On: 10/16/2017 17:51        Scheduled Meds: . potassium chloride  40 mEq Oral Q4H  . sodium bicarbonate  650 mg Oral BID   Continuous Infusions: . lactated ringers    . piperacillin-tazobactam (ZOSYN)  IV 3.375 g (10/15/17 0540)  . [START ON 10/16/2017] vancomycin       LOS: 1 day    Time spent: over 30 minutes    Fayrene Helper, MD [facility] Pager [phone]  If 7PM-7AM, please contact night-coverage [email or web address] Password [email or web address] 10/15/2017, 7:45 AM

## 2017-10-15 NOTE — Progress Notes (Signed)
Contacted by Dr. Florene Glen for consideration of endoscopic drainage of large thoracic abscess after recent paraesophageal hernia surgery. I did not see the patient in person. Discussed the case with Dr. Ardis Hughs He and I have reviewed the recent CT scan of the abdomen pelvis performed with contrast yesterday Shows a complex abscess, 3 x 8 cm, in the lower mediastinum and approximating pleural effusion.  There is thickening in the intrathoracic stomach overlying esophagus. This  abscess is not amenable to endoscopic drainage by our gastroenterology division.  It is possible that endoscopic drainage could be considered at a tertiary care center such as [facility], however given the complexity of this abscess, even this may not be possible. I recommend that cardiothoracic surgery be involved in this case further management

## 2017-10-16 DIAGNOSIS — J90 Pleural effusion, not elsewhere classified: Secondary | ICD-10-CM

## 2017-10-16 DIAGNOSIS — J9859 Other diseases of mediastinum, not elsewhere classified: Secondary | ICD-10-CM

## 2017-10-16 DIAGNOSIS — N390 Urinary tract infection, site not specified: Secondary | ICD-10-CM

## 2017-10-16 LAB — CBC
HCT: 28.1 % — ABNORMAL LOW (ref 36.0–46.0)
Hemoglobin: 9.2 g/dL — ABNORMAL LOW (ref 12.0–15.0)
MCH: 28.2 pg (ref 26.0–34.0)
MCHC: 32.7 g/dL (ref 30.0–36.0)
MCV: 86.2 fL (ref 78.0–100.0)
PLATELETS: 370 10*3/uL (ref 150–400)
RBC: 3.26 MIL/uL — AB (ref 3.87–5.11)
RDW: 17.5 % — AB (ref 11.5–15.5)
WBC: 29.9 10*3/uL — AB (ref 4.0–10.5)

## 2017-10-16 LAB — COMPREHENSIVE METABOLIC PANEL
ALBUMIN: 1.9 g/dL — AB (ref 3.5–5.0)
ALT: 16 U/L (ref 14–54)
ANION GAP: 10 (ref 5–15)
AST: 23 U/L (ref 15–41)
Alkaline Phosphatase: 72 U/L (ref 38–126)
BUN: 6 mg/dL (ref 6–20)
CHLORIDE: 115 mmol/L — AB (ref 101–111)
CO2: 10 mmol/L — AB (ref 22–32)
Calcium: 7.6 mg/dL — ABNORMAL LOW (ref 8.9–10.3)
Creatinine, Ser: 1.03 mg/dL — ABNORMAL HIGH (ref 0.44–1.00)
GFR calc non Af Amer: 54 mL/min — ABNORMAL LOW (ref >60)
Glucose, Bld: 75 mg/dL (ref 65–99)
POTASSIUM: 3.6 mmol/L (ref 3.5–5.1)
SODIUM: 135 mmol/L (ref 135–145)
Total Bilirubin: 0.9 mg/dL (ref 0.3–1.2)
Total Protein: 6.4 g/dL — ABNORMAL LOW (ref 6.5–8.1)

## 2017-10-16 LAB — TYPE AND SCREEN
ABO/RH(D): O POS
ANTIBODY SCREEN: NEGATIVE
UNIT DIVISION: 0

## 2017-10-16 LAB — C DIFFICILE QUICK SCREEN W PCR REFLEX
C DIFFICILE (CDIFF) TOXIN: NEGATIVE
C DIFFICLE (CDIFF) ANTIGEN: NEGATIVE
C Diff interpretation: NOT DETECTED

## 2017-10-16 LAB — BPAM RBC
BLOOD PRODUCT EXPIRATION DATE: 201812062359
ISSUE DATE / TIME: 201811092136
UNIT TYPE AND RH: 5100

## 2017-10-16 LAB — GLUCOSE, CAPILLARY
GLUCOSE-CAPILLARY: 77 mg/dL (ref 65–99)
GLUCOSE-CAPILLARY: 74 mg/dL (ref 65–99)

## 2017-10-16 LAB — OCCULT BLOOD X 1 CARD TO LAB, STOOL: Fecal Occult Bld: NEGATIVE

## 2017-10-16 LAB — MAGNESIUM: Magnesium: 1.4 mg/dL — ABNORMAL LOW (ref 1.7–2.4)

## 2017-10-16 LAB — PHOSPHORUS: PHOSPHORUS: 2.1 mg/dL — AB (ref 2.5–4.6)

## 2017-10-16 MED ORDER — MAGIC MOUTHWASH
15.0000 mL | Freq: Four times a day (QID) | ORAL | Status: DC | PRN
  Filled 2017-10-16: qty 15

## 2017-10-16 MED ORDER — MENTHOL 3 MG MT LOZG: 1.0000 | LOZENGE | OROMUCOSAL | Status: DC | PRN

## 2017-10-16 MED ORDER — GUAIFENESIN-DM 100-10 MG/5ML PO SYRP: 10.0000 mL | ORAL_SOLUTION | ORAL | Status: DC | PRN

## 2017-10-16 MED ORDER — ENSURE PRE-SURGERY PO LIQD
296.0000 mL | Freq: Once | ORAL | Status: AC
  Administered 2017-10-16: 296 mL via ORAL
  Filled 2017-10-16: qty 296

## 2017-10-16 MED ORDER — HYDROCORTISONE 1 % EX CREA: 1.0000 "application " | TOPICAL_CREAM | Freq: Three times a day (TID) | CUTANEOUS | Status: DC | PRN

## 2017-10-16 MED ORDER — LIP MEDEX EX OINT
1.0000 "application " | TOPICAL_OINTMENT | Freq: Two times a day (BID) | CUTANEOUS | Status: DC
  Administered 2017-10-16 – 2017-10-30 (×19): 1 via TOPICAL
  Filled 2017-10-16 (×2): qty 7

## 2017-10-16 MED ORDER — PHENOL 1.4 % MT LIQD: 1.0000 | OROMUCOSAL | Status: DC | PRN

## 2017-10-16 MED ORDER — MAGNESIUM SULFATE 4 GM/100ML IV SOLN
4.0000 g | Freq: Once | INTRAVENOUS | Status: AC
  Administered 2017-10-16: 4 g via INTRAVENOUS
  Filled 2017-10-16: qty 100

## 2017-10-16 MED ORDER — POTASSIUM CHLORIDE 10 MEQ/100ML IV SOLN
10.0000 meq | INTRAVENOUS | Status: AC
  Administered 2017-10-17 (×2): 10 meq via INTRAVENOUS
  Filled 2017-10-16 (×2): qty 100

## 2017-10-16 MED ORDER — PSYLLIUM 95 % PO PACK
1.0000 | PACK | Freq: Every day | ORAL | Status: DC
Start: 2017-10-16 — End: 2017-10-21
  Administered 2017-10-17: 1 via ORAL
  Filled 2017-10-16 (×7): qty 1

## 2017-10-16 MED ORDER — LACTATED RINGERS IV SOLN
INTRAVENOUS | Status: DC
  Administered 2017-10-16: 17:00:00 via INTRAVENOUS
  Administered 2017-10-17: 100 mL/h via INTRAVENOUS
  Administered 2017-10-17: 06:00:00 via INTRAVENOUS

## 2017-10-16 MED ORDER — ALUM & MAG HYDROXIDE-SIMETH 200-200-20 MG/5ML PO SUSP: 30.0000 mL | Freq: Four times a day (QID) | ORAL | Status: DC | PRN

## 2017-10-16 MED ORDER — BISACODYL 10 MG RE SUPP: 10.0000 mg | Freq: Two times a day (BID) | RECTAL | Status: DC | PRN

## 2017-10-16 MED ORDER — DIPHENHYDRAMINE HCL 50 MG/ML IJ SOLN: 12.5000 mg | Freq: Four times a day (QID) | INTRAMUSCULAR | Status: DC | PRN

## 2017-10-16 MED ORDER — POTASSIUM PHOSPHATES 15 MMOLE/5ML IV SOLN
20.0000 mmol | Freq: Once | INTRAVENOUS | Status: AC
  Administered 2017-10-16: 20 mmol via INTRAVENOUS
  Filled 2017-10-16: qty 6.67

## 2017-10-16 MED ORDER — DICYCLOMINE HCL 10 MG/5ML PO SOLN
20.0000 mg | Freq: Three times a day (TID) | ORAL | Status: DC | PRN
  Filled 2017-10-16: qty 10

## 2017-10-16 MED ORDER — HYDROCORTISONE 2.5 % RE CREA
1.0000 "application " | TOPICAL_CREAM | Freq: Four times a day (QID) | RECTAL | Status: DC | PRN
  Filled 2017-10-16: qty 28.35

## 2017-10-16 MED ORDER — VANCOMYCIN HCL IN DEXTROSE 750-5 MG/150ML-% IV SOLN
750.0000 mg | INTRAVENOUS | Status: DC
  Administered 2017-10-17: 750 mg via INTRAVENOUS
  Filled 2017-10-16 (×2): qty 150

## 2017-10-16 NOTE — Progress Notes (Addendum)
Patient ID: Danielle [name], female   DOB: June 29, 1946, 71 y.o.   MRN: [id number]          [facility]  Date of Admission:  10/25/2017           Day 3 vancomycin        Day 3 piperacillin tazobactam        Day 2 anidulafungin ASSESSMENT: I strongly suspect that she has a recurrent mediastinal abscess causing her recent fevers, nausea and vomiting. I talked to her about the potential utility of thoracentesis. She says that she will agree to the procedure. So far her blood cultures remain negative. If they remain negative at 48 hours I will go ahead and place ordered for a PICC and I will DC vancomycin since MRSA is unlikely in this setting.  PLAN: 1. Continue vancomycin, piperacillin tazobactam and anidulafungin pending blood culture results 2. Okay to place PICC later this afternoon if blood cultures remain negative at 48 hours 3. Recommend offering thoracentesis to her again  Active Problems:   Abscess of mediastinum ([place])   Incarcerated hiatal hernia s/p repair 09/08/2017   Leukocytosis   Perianal Crohn's disease, with fistula (HCC)   Acute renal failure superimposed on stage 3 chronic kidney disease (HCC)   Immunosuppressed status (HCC)   Hypotension   Nausea and vomiting   DVT (deep venous thrombosis) (HCC)   Protein-calorie malnutrition, moderate (HCC)   Mediastinal post-op seromas   Pleural effusion on left   Scheduled Meds: . feeding supplement  1 Container Oral TID BM  . lip balm  1 application Topical BID  . psyllium  1 packet Oral Daily  . sodium bicarbonate  650 mg Oral BID   Continuous Infusions: . 0.45 % NaCl with KCl 20 mEq / L 100 mL/hr at 10/15/17 1828  . anidulafungin    . piperacillin-tazobactam (ZOSYN)  IV 3.375 g (10/16/17 4268)  . vancomycin Stopped (10/16/17 0713)   PRN Meds:.acetaminophen **OR** acetaminophen, alum & mag hydroxide-simeth, bisacodyl, diphenhydrAMINE, guaiFENesin-dextromethorphan, hydrocortisone,  hydrocortisone cream, magic mouthwash, menthol-cetylpyridinium, morphine injection, nystatin, phenol, promethazine   SUBJECTIVE: She is still bothered by nausea and vomiting. She is worried that she has not been able to eat in 10 days. She refused thoracentesis earlier this morning but tells me that she did not understand why it was being recommended. I talked to her about this. She has not been having any dysuria or other symptoms of a urinary tract infection.  Review of Systems: Review of Systems  Constitutional: Positive for malaise/fatigue and weight loss. Negative for chills, diaphoresis and fever.  Respiratory: Negative for cough, sputum production and shortness of breath.   Cardiovascular: Negative for chest pain.  Gastrointestinal: Positive for abdominal pain, diarrhea, nausea and vomiting.  Genitourinary: Negative for dysuria, frequency and urgency.  Neurological: Positive for weakness.    Allergies  Allergen Reactions  . Zithromax [Azithromycin] Swelling and Other (See Comments)    Mouth swelling and blisters   . Aspirin Other (See Comments)    Stomach cramping   . Ibuprofen Other (See Comments)    Pt states that she prefers not to take this medication.   . Iron Nausea And Vomiting and Other (See Comments)    FERROUS SULFATE  . Sulfa Antibiotics Nausea And Vomiting    OBJECTIVE: Vitals:   10/16/17 0100 10/16/17 0400 10/16/17 0500 10/16/17 0710  BP: (!) 87/44 (!) 117/56 111/60   Pulse: 87 93 87   Resp: (!) 24 20  18   Temp: 98.1 F (36.7 C) 98.4 F (36.9 C)  99.8 F (37.7 C)  TempSrc: Oral Oral  Oral  SpO2: 100% 100% 95%   Weight:      Height:       Body mass index is 20.83 kg/m.  Physical Exam  Constitutional: She is oriented to person, place, and time.  She remains weak and frail but in no distress resting comfortably in bed.  Cardiovascular: Normal rate and regular rhythm.  No murmur heard. Pulmonary/Chest: Effort normal and breath sounds normal.    Abdominal: Soft. She exhibits no distension and no mass. There is tenderness.  Mild mid abdominal pain with palpation.  Musculoskeletal:  She is complaining of pain at her left forearm IV site. There is no redness.  Neurological: She is alert and oriented to person, place, and time.  Skin: No rash noted.  Psychiatric: Mood and affect normal.    Lab Results Lab Results  Component Value Date   WBC 22.7 (H) 10/15/2017   HGB 6.8 (LL) 10/15/2017   HCT 20.8 (L) 10/15/2017   MCV 86.6 10/15/2017   PLT 359 10/15/2017    Lab Results  Component Value Date   CREATININE 1.06 (H) 10/15/2017   BUN 13 10/15/2017   NA 136 10/15/2017   K 3.1 (L) 10/15/2017   CL 112 (H) 10/15/2017   CO2 14 (L) 10/15/2017    Lab Results  Component Value Date   ALT 25 10/15/2017   AST 37 10/15/2017   ALKPHOS 81 10/15/2017   BILITOT 0.6 10/15/2017     Microbiology: Recent Results (from the past 240 hour(s))  Culture, blood (Routine x 2)     Status: None (Preliminary result)   Collection Time: 10/09/2017  2:05 PM  Result Value Ref Range Status   Specimen Description BLOOD LEFT FOREARM  Final   Special Requests   Final    BOTTLES DRAWN AEROBIC AND ANAEROBIC Blood Culture results may not be optimal due to an inadequate volume of blood received in culture bottles   Culture   Final    NO GROWTH < 24 HOURS Performed at [facility], [place] [address], [place], [place] [address]    Report Status PENDING  Incomplete  MRSA PCR Screening     Status: None   Collection Time: 10/09/2017 10:45 PM  Result Value Ref Range Status   MRSA by PCR NEGATIVE NEGATIVE Final    Comment:        The GeneXpert MRSA Assay (FDA approved for NASAL specimens only), is one component of a comprehensive MRSA colonization surveillance program. It is not intended to diagnose MRSA infection nor to guide or monitor treatment for MRSA infections.     Michel Bickers, MD [facility] [place]  [facility] [phone] pager   [phone] cell 10/16/2017, 9:45 AM

## 2017-10-16 NOTE — Progress Notes (Signed)
Spoke with Judson Roch re PICC placement in [place] if [facility] results are posted. Currently not posted in computer for negative confirmation per order.  Has adequate PIV access currently.

## 2017-10-16 NOTE — Progress Notes (Signed)
[address] [place], [place] [address] Phone: [phone]  FAX: [phone]      Danielle [name] [id number] 1946-04-18  CARE TEAM:  PCP: Iona Beard, MD  Outpatient Care Team: Patient Care Team: Iona Beard, MD as PCP - General (Family Medicine) Teena Irani, MD as Consulting Physician (Gastroenterology) Rogene Houston, MD as Consulting Physician (Gastroenterology) Burke Keels, MD (Inactive) as Consulting Physician (General Surgery) Leighton Ruff, MD as Consulting Physician (General Surgery) Ralene Ok, MD as Consulting Physician (General Surgery) Campbell Riches, MD as Consulting Physician (Infectious Diseases) Ralene Ok, MD as Consulting Physician (General Surgery)  Inpatient Treatment Team: Treatment Team: Attending Provider: Elodia Florence., MD; Consulting Physician: Ralene Ok, MD; Rounding Team: [name], MD; Consulting Physician: [name], Md, MD; Technician: [name], NT; Registered Nurse: Kathy Breach, RN; Registered Nurse: [name], RN   Problem List:   Principal Problem:   Mediastinal post-op seromas Active Problems:   Perianal Crohn's disease, with fistula ([place])   Leukocytosis   Acute renal failure superimposed on stage 3 chronic kidney disease ([place])   Incarcerated hiatal hernia s/p repair 09/08/2017   Immunosuppressed status ([place])   Hypotension   UTI (urinary tract infection)   Nausea and vomiting   DVT (deep venous thrombosis) ([place])   Protein-calorie malnutrition, moderate ([place])   Abscess of mediastinum (HCC)   Pleural effusion on left   09/08/2017   POST-OPERATIVE DIAGNOSIS:  Type 4 paraesophageal hiatal hernia   PROCEDURE:  DIAGNOSTIC LAPAROSCOPIC PLACEMENT OF G-TUBE AND GASTROPEXY (N/A) LAPAROSCOPIC LYSIS OF ADHESIONS45 minutes (N/A)  SURGEON:  Ralene Ok, MD - Primary  09/10/2017  Post-op diagnosis/intraop  findings:   moderate volume pneumoperitoneum and purulent peritonitis, possible leakage at g tube site  Procedure:   exploratory laparotomy, takedown of gastrostomy and repair of gastrotomy, washout and placement of mediastinal drains, hepatorrhaphy x 2    Assessment  Mediastinal fluid collections.  Probable postoperative seromas.  Possible infection.  Plan:  -I had a long discussion with interventional radiology staff yesterday.  I reviewed for left pleural thoracentesis and possible drain placement that would help the collections drain better.  Culture to see if truly infected seroma.  Most likely mediastinal fluid collections are from the giant hernia sac and are contiguous and would drain through the left pleura.  At the very least it would get rid of some of the fluid and make antibiotics more likely to be effective.  There is hesitancy.  She would not be a good candidate for VATS procedure.   Perisplenic fluid collection small - don't touch.  -Continue antibiotics at this time.  Prior fluid collection growing Candida glabrata and albicans.  Antifungal therapy.  Infectious disease involved.  Probable urinary tract infection.  -Nausea vomiting seems resolved.  Tolerated clears.  Advance to pured diet as tolerated after procedure. -Parenteral nutrition may be required given poor p.o. intake.  Ideally would get feeding tube and since she broke down the last one, understandably hesitant to try again.   -Transfuse as needed for anemia. -PT/OT -VTE prophylaxis- SCDs, etc -mobilize as tolerated to help recovery  30 minutes spent in review, evaluation, examination, counseling, and coordination of care.  More than 50% of that time was spent in counseling.  I updated the patient's status to the patient and nurse.  Also discussed with Dr. Florene Glen who is primary.  Recommendations were made.  Questions were answered.  They expressed understanding & appreciation.  [name], M.D.,  F.A.C.S. Gastrointestinal and Minimally Invasive Surgery [facility] [address] [place], [place] [address] [phone] Main / Paging   10/16/2017    Subjective: (Chief complaint)  Nausea vomiting resolved.  Tolerated clears yesterday afternoon and evening.  Still with some nausea but wants to eat.  Denies abdominal pain.  Just had large bowel movement.  Objective:  Vital signs:  Vitals:   10/16/17 0000 10/16/17 0100 10/16/17 0400 10/16/17 0500  BP: (!) 83/48 (!) 87/44 (!) 117/56 111/60  Pulse: 88 87 93 87  Resp: (!) 27 (!) 24 20 18   Temp: 98 F (36.7 C) 98.1 F (36.7 C) 98.4 F (36.9 C)   TempSrc: Oral Oral Oral   SpO2: 100% 100% 100% 95%  Weight:      Height:        Last BM Date: 10/15/17  Intake/Output   Yesterday:  11/09 0701 - 11/10 0700 In: 2461.7 [I.V.:1686.7; Blood:425; IV Piggyback:350] Out: 200 [Urine:200] This shift:  No intake/output data recorded.  Bowel function:  Flatus: YES  BM:  YES  Drain: Serosanguinous   Physical Exam:  General: Pt awake/alert/oriented x4 in no acute distress Eyes: PERRL, normal EOM.  Sclera clear.  No icterus Neuro: CN II-XII intact w/o focal sensory/motor deficits. Lymph: No head/neck/groin lymphadenopathy Psych:  No delerium/psychosis/paranoia HENT: Normocephalic, Mucus membranes moist.  No thrush Neck: Supple, No tracheal deviation Chest: No chest wall pain w good excursion CV:  Pulses intact.  Regular rhythm MS: Normal AROM mjr joints.  Signif kyphoscoliosis   Abdomen: Soft.  Nondistended.  Nontender.  Wound 4 x 2 cm superficial granulation.  No evidence of peritonitis.  No incarcerated hernias.  Rectal - perc drain inplace Ext:  No deformity.  No mjr edema.  No cyanosis Skin: No petechiae / purpura  Results:   Labs: Results for orders placed or performed during the hospital encounter of 10/30/2017 (from the past 48 hour(s))  Lipase, blood     Status: None    Collection Time: 10/23/2017  2:05 PM  Result Value Ref Range   Lipase 34 11 - 51 U/L  CBC     Status: Abnormal   Collection Time: 10/19/2017  2:05 PM  Result Value Ref Range   WBC 23.0 (H) 4.0 - 10.5 K/uL   RBC 4.40 3.87 - 5.11 MIL/uL   Hemoglobin 12.3 12.0 - 15.0 g/dL   HCT 37.5 36.0 - 46.0 %   MCV 85.2 78.0 - 100.0 fL   MCH 28.0 26.0 - 34.0 pg   MCHC 32.8 30.0 - 36.0 g/dL   RDW 17.2 (H) 11.5 - 15.5 %   Platelets 276 150 - 400 K/uL  Protime-INR     Status: Abnormal   Collection Time: 11/02/2017  2:05 PM  Result Value Ref Range   Prothrombin Time 19.7 (H) 11.4 - 15.2 seconds   INR 1.69   Culture, blood (Routine x 2)     Status: None (Preliminary result)   Collection Time: 10/19/2017  2:05 PM  Result Value Ref Range   Specimen Description BLOOD LEFT FOREARM    Special Requests      BOTTLES DRAWN AEROBIC AND ANAEROBIC Blood Culture results may not be optimal due to an inadequate volume of blood received in culture bottles   Culture      NO GROWTH < 24 HOURS Performed at [facility], [place] [address], [place], [place] [address]    Report Status PENDING  Comprehensive metabolic panel     Status: Abnormal   Collection Time: 10/18/2017  2:05 PM  Result Value Ref Range   Sodium 133 (L) 135 - 145 mmol/L   Potassium 3.5 3.5 - 5.1 mmol/L   Chloride 103 101 - 111 mmol/L   CO2 18 (L) 22 - 32 mmol/L   Glucose, Bld 84 65 - 99 mg/dL   BUN 17 6 - 20 mg/dL   Creatinine, Ser 1.29 (H) 0.44 - 1.00 mg/dL   Calcium 8.3 (L) 8.9 - 10.3 mg/dL   Total Protein 8.3 (H) 6.5 - 8.1 g/dL   Albumin 2.3 (L) 3.5 - 5.0 g/dL   AST 57 (H) 15 - 41 U/L   ALT 34 14 - 54 U/L   Alkaline Phosphatase 95 38 - 126 U/L   Total Bilirubin 1.1 0.3 - 1.2 mg/dL   GFR calc non Af Amer 41 (L) >60 mL/min   GFR calc Af Amer 48 (L) >60 mL/min    Comment: (NOTE) The eGFR has been calculated using the CKD EPI equation. This calculation has not been validated in all clinical situations. eGFR's persistently <60 mL/min signify  possible Chronic Kidney Disease.    Anion gap 12 5 - 15  Differential     Status: Abnormal   Collection Time: 10/10/2017  2:05 PM  Result Value Ref Range   Neutrophils Relative % 62 %   Neutro Abs 14.3 (H) 1.7 - 7.7 K/uL   Lymphocytes Relative 22 %   Lymphs Abs 5.1 (H) 0.7 - 4.0 K/uL   Monocytes Relative 15 %   Monocytes Absolute 3.5 (H) 0.1 - 1.0 K/uL   Eosinophils Relative 1 %   Eosinophils Absolute 0.1 0.0 - 0.7 K/uL   Basophils Relative 0 %   Basophils Absolute 0.0 0.0 - 0.1 K/uL  I-Stat CG4 Lactic Acid, ED     Status: None   Collection Time: 10/07/2017  2:20 PM  Result Value Ref Range   Lactic Acid, Venous 1.78 0.5 - 1.9 mmol/L  Urinalysis, Routine w reflex microscopic     Status: Abnormal   Collection Time: 10/20/2017  3:01 PM  Result Value Ref Range   Color, Urine YELLOW YELLOW   APPearance HAZY (A) CLEAR   Specific Gravity, Urine 1.014 1.005 - 1.030   pH 6.0 5.0 - 8.0   Glucose, UA NEGATIVE NEGATIVE mg/dL   Hgb urine dipstick MODERATE (A) NEGATIVE   Bilirubin Urine NEGATIVE NEGATIVE   Ketones, ur 5 (A) NEGATIVE mg/dL   Protein, ur 30 (A) NEGATIVE mg/dL   Nitrite NEGATIVE NEGATIVE   Leukocytes, UA LARGE (A) NEGATIVE   RBC / HPF 0-5 0 - 5 RBC/hpf   WBC, UA TOO NUMEROUS TO COUNT 0 - 5 WBC/hpf   Bacteria, UA RARE (A) NONE SEEN   Squamous Epithelial / LPF 0-5 (A) NONE SEEN   WBC Clumps PRESENT   MRSA PCR Screening     Status: None   Collection Time: 10/20/2017 10:45 PM  Result Value Ref Range   MRSA by PCR NEGATIVE NEGATIVE    Comment:        The GeneXpert MRSA Assay (FDA approved for NASAL specimens only), is one component of a comprehensive MRSA colonization surveillance program. It is not intended to diagnose MRSA infection nor to guide or monitor treatment for MRSA infections.   Comprehensive metabolic panel     Status: Abnormal   Collection Time: 10/15/17  4:00 AM  Result Value Ref Range   Sodium 136 135 -  145 mmol/L   Potassium 3.1 (L) 3.5 - 5.1 mmol/L    Chloride 112 (H) 101 - 111 mmol/L   CO2 14 (L) 22 - 32 mmol/L   Glucose, Bld 95 65 - 99 mg/dL   BUN 13 6 - 20 mg/dL   Creatinine, Ser 1.06 (H) 0.44 - 1.00 mg/dL   Calcium 7.4 (L) 8.9 - 10.3 mg/dL   Total Protein 7.4 6.5 - 8.1 g/dL   Albumin 2.1 (L) 3.5 - 5.0 g/dL   AST 37 15 - 41 U/L   ALT 25 14 - 54 U/L   Alkaline Phosphatase 81 38 - 126 U/L   Total Bilirubin 0.6 0.3 - 1.2 mg/dL   GFR calc non Af Amer 52 (L) >60 mL/min   GFR calc Af Amer >60 >60 mL/min    Comment: (NOTE) The eGFR has been calculated using the CKD EPI equation. This calculation has not been validated in all clinical situations. eGFR's persistently <60 mL/min signify possible Chronic Kidney Disease.    Anion gap 10 5 - 15  CBC WITH DIFFERENTIAL     Status: Abnormal   Collection Time: 10/15/17  4:00 AM  Result Value Ref Range   WBC 22.7 (H) 4.0 - 10.5 K/uL   RBC 2.92 (L) 3.87 - 5.11 MIL/uL   Hemoglobin 8.2 (L) 12.0 - 15.0 g/dL    Comment: DELTA CHECK NOTED REPEATED TO VERIFY    HCT 25.3 (L) 36.0 - 46.0 %   MCV 86.6 78.0 - 100.0 fL   MCH 28.1 26.0 - 34.0 pg   MCHC 32.4 30.0 - 36.0 g/dL   RDW 17.4 (H) 11.5 - 15.5 %   Platelets 359 150 - 400 K/uL   Neutrophils Relative % 75 %   Neutro Abs 17.0 (H) 1.7 - 7.7 K/uL   Lymphocytes Relative 13 %   Lymphs Abs 2.9 0.7 - 4.0 K/uL   Monocytes Relative 11 %   Monocytes Absolute 2.5 (H) 0.1 - 1.0 K/uL   Eosinophils Relative 1 %   Eosinophils Absolute 0.3 0.0 - 0.7 K/uL   Basophils Relative 0 %   Basophils Absolute 0.0 0.0 - 0.1 K/uL  Protime-INR     Status: Abnormal   Collection Time: 10/15/17  4:00 AM  Result Value Ref Range   Prothrombin Time 18.1 (H) 11.4 - 15.2 seconds   INR 1.52   Magnesium     Status: Abnormal   Collection Time: 10/15/17  4:00 AM  Result Value Ref Range   Magnesium 1.3 (L) 1.7 - 2.4 mg/dL  Phosphorus     Status: Abnormal   Collection Time: 10/15/17  4:00 AM  Result Value Ref Range   Phosphorus 2.1 (L) 2.5 - 4.6 mg/dL  Hemoglobin and  hematocrit, blood     Status: Abnormal   Collection Time: 10/15/17  4:51 PM  Result Value Ref Range   Hemoglobin 6.8 (LL) 12.0 - 15.0 g/dL    Comment: REPEATED TO VERIFY CRITICAL RESULT CALLED TO, READ BACK BY AND VERIFIED WITH: MCNABB,B. RN @1724  ON 11.09.18 BY COHEN,K    HCT 20.8 (L) 36.0 - 46.0 %  Glucose, capillary     Status: Abnormal   Collection Time: 10/15/17  6:00 PM  Result Value Ref Range   Glucose-Capillary 104 (H) 65 - 99 mg/dL   Comment 1 Notify RN    Comment 2 Document in Chart   Type and screen [place]     Status: None (Preliminary result)   Collection Time:  10/15/17  6:03 PM  Result Value Ref Range   ABO/RH(D) O POS    Antibody Screen NEG    Sample Expiration 10/18/2017    Unit Number [id number]    Blood Component Type RED CELLS,LR    Unit division 00    Status of Unit ISSUED    Transfusion Status OK TO TRANSFUSE    Crossmatch Result Compatible   Prepare RBC     Status: None   Collection Time: 10/15/17  8:00 PM  Result Value Ref Range   Order Confirmation ORDER PROCESSED BY BLOOD BANK   Glucose, capillary     Status: None   Collection Time: 10/15/17 11:14 PM  Result Value Ref Range   Glucose-Capillary 90 65 - 99 mg/dL   Comment 1 Notify RN    Comment 2 Document in Chart   Glucose, capillary     Status: None   Collection Time: 10/16/17  6:06 AM  Result Value Ref Range   Glucose-Capillary 77 65 - 99 mg/dL   Comment 1 Notify RN    Comment 2 Document in Chart     Imaging / Studies: Dg Chest 2 View  Result Date: 10/13/2017 CLINICAL DATA:  Dyspnea and possible sepsis. EXAM: CHEST  2 VIEW COMPARISON:  10/05/2017 CT chest.  09/2028 CXR. FINDINGS: Borderline cardiomegaly with minimal aortic atherosclerosis. There has been interval decrease in left-sided pleural effusion now small to moderate. Probable compressive atelectasis at the left lung base accounting for retrocardiac homogeneous opacity that still persists. Mild central vascular  congestion is again re- demonstrated. No acute osseous abnormality. Chronic right-sided rib fractures. The patient's chin obscures the lung apices. Left-sided PICC line has been removed in the interim. IMPRESSION: 1. Borderline cardiomegaly with interval decrease in left pleural effusion. Persistent presumed compressive atelectasis at the left lung base and mild central vascular congestion. 2. Mild aortic atherosclerosis. Electronically Signed   By: Ashley Royalty M.D.   On: 10/07/2017 14:58   Ct Abdomen Pelvis W Contrast  Result Date: 10/13/2017 CLINICAL DATA:  Nausea, vomiting.  Decreased urine output. EXAM: CT ABDOMEN AND PELVIS WITH CONTRAST TECHNIQUE: Multidetector CT imaging of the abdomen and pelvis was performed using the standard protocol following bolus administration of intravenous contrast. CONTRAST:  178m ISOVUE-300 IOPAMIDOL (ISOVUE-300) INJECTION 61% COMPARISON:  CT abdomen dated 10/01/2017. FINDINGS: Lower chest: Bibasilar pleural effusions, left greater than right, incompletely imaged, with associated atelectasis. Interval reaccumulation of the fluid collection in the lower mediastinum, surrounding the herniated stomach, measuring approximately 8 cm transverse dimension by 3 cm greatest thickness, craniocaudal dimension incompletely imaged. This collection is now similar to the size seen on CT abdomen of 09/16/2017, when a drainage catheter was in place. Hepatobiliary: No focal liver abnormality is seen. Status post cholecystectomy. No biliary dilatation. Pancreas: Unremarkable. No pancreatic ductal dilatation or surrounding inflammatory changes. Spleen: Small complex collection along the posterior margin of the spleen, possibly abscess, measuring 3.3 x 1.3 cm. Spleen otherwise unremarkable. Adrenals/Urinary Tract: Adrenal glands appear normal. Kidneys are unremarkable without mass, stone or hydronephrosis. No ureteral or bladder calculi. Bladder is unremarkable. Stomach/Bowel: No dilated large or  small bowel loops. No convincing evidence of bowel wall thickening or bowel wall inflammation within the abdomen or pelvis. There is at least some thickening/inflammation of the walls of the herniated stomach and lower esophagus, again incompletely imaged, likely reactive to the adjacent fluid collection. Vascular/Lymphatic: No significant vascular findings are present. No enlarged abdominal or pelvic lymph nodes. Reproductive: No adnexal mass or free fluid.  Other: No free intraperitoneal air. Musculoskeletal: No acute or suspicious osseous finding. IMPRESSION: 1. Reaccumulation of the previously demonstrated fluid collection in the lower mediastinum, presumably abscess, likely multiloculated, surrounding the herniated portion of the stomach and lower esophagus, measuring 8 x 3 cm (transverse by AP dimensions), craniocaudal extent incompletely imaged. The size of the fluid collection is now similar to the size seen on CT abdomen of 09/16/2017, at which point there were drainage catheters in place. 2. Thickening of the walls of the herniated portion of the stomach and the overlying esophagus, incompletely imaged, likely reactive to the adjacent fluid collection/abscess. 3. Bibasilar pleural effusions, left greater than right, incompletely imaged, with associated atelectasis. 4. Small complex collection along the posterior margin of the spleen, possibly additional small abscess, measuring 3.3 x 1.3 cm. 5. No evidence of bowel obstruction within the abdomen or pelvis. Recommend chest CT to determine the entire craniocaudal extent of the mediastinal fluid collection. These results were called by telephone at the time of interpretation on 10/09/2017 at 5:45 pm to Dr. Vanita Panda, who verbally acknowledged these results. Electronically Signed   By: Franki Cabot M.D.   On: 10/08/2017 17:51    Medications / Allergies: per chart  Antibiotics: Anti-infectives (From admission, onward)   Start     Dose/Rate Route Frequency  Ordered Stop   10/16/17 1800  anidulafungin (ERAXIS) 100 mg in sodium chloride 0.9 % 100 mL IVPB     100 mg 78 mL/hr over 100 Minutes Intravenous Every 24 hours 10/15/17 1657     10/16/17 0600  vancomycin (VANCOCIN) IVPB 1000 mg/200 mL premix     1,000 mg 200 mL/hr over 60 Minutes Intravenous Every 36 hours 11/04/2017 2308     10/15/17 1800  anidulafungin (ERAXIS) 200 mg in sodium chloride 0.9 % 200 mL IVPB     200 mg 78 mL/hr over 200 Minutes Intravenous  Once 10/15/17 1657 10/15/17 2148   10/09/2017 2330  piperacillin-tazobactam (ZOSYN) IVPB 3.375 g  Status:  Discontinued     3.375 g 100 mL/hr over 30 Minutes Intravenous Every 8 hours 10/28/2017 2257 10/22/2017 2259   10/20/2017 2315  piperacillin-tazobactam (ZOSYN) IVPB 3.375 g     3.375 g 12.5 mL/hr over 240 Minutes Intravenous Every 8 hours 11/03/2017 2300     10/17/2017 1930  vancomycin (VANCOCIN) IVPB 1000 mg/200 mL premix     1,000 mg 200 mL/hr over 60 Minutes Intravenous  Once 10/25/2017 1915 10/18/2017 2044   10/12/2017 1530  piperacillin-tazobactam (ZOSYN) IVPB 3.375 g     3.375 g 100 mL/hr over 30 Minutes Intravenous  Once 10/29/2017 1529 10/24/2017 1604        Note: Portions of this report may have been transcribed using voice recognition software. Every effort was made to ensure accuracy; however, inadvertent computerized transcription errors may be present.   Any transcriptional errors that result from this process are unintentional.     [name], M.D., F.A.C.S. Gastrointestinal and Minimally Invasive Surgery [facility] [address] [place], [place] [address] [phone] Main / Paging   10/16/2017

## 2017-10-16 NOTE — Progress Notes (Signed)
Pharmacy Antibiotic Note  Danielle [name] is a 71 y.o. female admitted on 10/07/2017 with Mediatinal abscess.  Pharmacy has been consulted for Vancomycin and Zosyn dosing.  Plan: Zosyn 3.375g IV q8h (4 hour infusion).  Vancomycin 1gm iv x1, change to 750mg  q24 hr   Height: 5' 4.5" (163.8 cm) Weight: 123 lb 3.8 oz (55.9 kg) IBW/kg (Calculated) : 55.85  Temp (24hrs), Avg:98.5 F (36.9 C), Min:97.4 F (36.3 C), Max:100.2 F (37.9 C)  Recent Labs  Lab 10/18/2017 1405 10/27/2017 1420 10/15/17 0400  WBC 23.0*  --  22.7*  CREATININE 1.29*  --  1.06*  LATICACIDVEN  --  1.78  --     Estimated Creatinine Clearance: 44.2 mL/min (A) (by C-G formula based on SCr of 1.06 mg/dL (H)).    Allergies  Allergen Reactions  . Zithromax [Azithromycin] Swelling and Other (See Comments)    Mouth swelling and blisters   . Aspirin Other (See Comments)    Stomach cramping   . Ibuprofen Other (See Comments)    Pt states that she prefers not to take this medication.   . Iron Nausea And Vomiting and Other (See Comments)    FERROUS SULFATE  . Sulfa Antibiotics Nausea And Vomiting    Antimicrobials this admission: Vancomycin 10/16/2017 >> Zosyn 10/16/2017 >>   Dose adjustments this admission: Vanc 1gm 36 hr > 750mg  q24 based on SCr to 1.06  Microbiology results:  11/8 BCx: ngtd 11/8 MRSA PCR: neg  Thank you for allowing pharmacy to be a part of this patient's care.  Minda Ditto 10/16/2017 12:18 PM

## 2017-10-16 NOTE — Progress Notes (Signed)
[facility] NOTE   Pharmacy Consult for TPN Indication: unable to take po  Patient Measurements: Height: 5' 4.5" (163.8 cm) Weight: 123 lb 3.8 oz (55.9 kg) IBW/kg (Calculated) : 55.85 TPN AdjBW (KG): 59 Body mass index is 20.83 kg/m.  Central access: holding PICC placement at least 48 hr to ensure no bacteremia TPN start date:   ASSESSMENT                                                                                                          HPI: 61 yoF re-admit 11/8 with N/V, loose stools (baseline w/Crohn's). Recurrent mediastinal fluid abscess, possible splenic abscess on CT PMH: Crohns, HH, hx SBO Significant events: 10/3 - 10/21 admit for [facility] repair, ICU post-op on vent & pressors, extubated POD2, was on TPN, pelvic fluid collection-C albicans & C glabrata tx with Eraxis & Ertapenem x 2 wk total 10/26 admit with fevers, tx with Zosyn  Significant events:  11/9: repletion of electrolytes Magnesium, Phosphorus, KCl 11/10: Boost tid, await labs  Today:   Glucose - wnl,   Electrolytes - K, Mag, Phos required repletion, NaBicarb tabs ordered, labs not drawn this am  Renal -wnl  LFTs - wnl  TGs - tomorrow  Prealbumin - tomorrow  Insulin Requirements: no hx DM, assess need for insulin coverage Added q6hr CBGs  Current Nutrition: NPO  IVF: 1/2 NS with 20 mEq KCL/L at 100 ml/hr   NUTRITIONAL GOALS                                                                                             RD recs: 60-70 gm protein, 1475-1170 kCal per 24 hr  Clinimix 5/15 at a goal rate of 55 ml/hr + 20% fat emulsion at 83ml/hr to provide: 66 g/day protein, 1465 Kcal/day.  PLAN                                                                                                                         Today: CBG order discontinued until TPN can be started Labs drawn today at 13:46 - unable to result CMET, Mag 1.4, Phosphorus 2.1  Magnesium 4gm  bolus  K Phos 20 mMol bolus  KCL 10 mEq runs x2    Begin Clinimix E 5/15 at 17ml/hr when/if PICC placed  20% fat emulsion at 69ml/hr.  Plan to advance as tolerated to the goal rate.  TPN to contain standard multivitamins and trace elements.  When TPN started plan to reduce IVF rate  Consider need for SSI based on CBGs  TPN lab panels on Mondays & Thursdays.  BMET, Mag, Phos level in am if able to obtain  F/u daily.  Minda Ditto PharmD Pager [phone] 10/16/2017, 7:46 AM

## 2017-10-16 NOTE — Progress Notes (Addendum)
PROGRESS NOTE    Danielle [name]  EGB:[id number] DOB: 13-Jul-1946 DOA: 10/22/2017 PCP: Iona Beard, MD    Brief Narrative:  Danielle [name] is Danielle [name] 71 y.o. female with medical history significant of Crohn's disease, hiatal hernia, left buttock abscess, psoriasis, small bowel obstruction who has been recently admitted in the discharge twice in the past 5 weeks, first from 09/08/2017 to 09/26/2017 after undergoing type IV hiatal hernia repair.  Postoperatively the patient required endotracheal intubation, mechanical ventilation and the use of pressors.  She was extubated on postop day #2.  She improved clinically and was on parenteral nutrition, however she developed Danielle [name] pelvic fluid collection which require Danielle [name] drain placement.  On 09/11/2017 the patient blood cultures were positive for Candida glabrata and abscess culture were positive for C. Albicans and C. Glabrata. She was given treatment with parenteral antifungals. She subsequently improved was able to ambulate with physical therapy, weaned off of parenteral nutrition and was tolerating oral diet. ID recommended to continue Invanz and Erasis for 2 weeks. Danielle [name] PICC line was placed in her right upper arm and she was subsequently discharged home.  She returned on 10/01/2017 due to intermittent fevers.  She was placed on the neck slowly and cultures were obtained.  Antibiotics were discontinue 24 hours prior to discharge and the patient remained afebrile and clinically improved.  However, she returns today with complaints of persistent nausea associated with about 3 episodes of emesis daily.  She has also had loose stools, but stated that this is her baseline since she suffers from Crohn's disease. She denies fever, chills, rhinorrhea, sore throat, productive cough, dyspnea, chest pain, palpitations, dizziness, diaphoresis, PND, orthopnea or pitting edema of the lower extremities. She denies constipation, melena or hematochezia.  She denies dysuria, frequency or  hematuria.  Denies polyuria, polydipsia or blurred vision.  Assessment & Plan:   Principal Problem:   UTI (urinary tract infection) Active Problems:   Leukocytosis   Perianal Crohn's disease, with fistula ([place])   Acute renal failure superimposed on stage 3 chronic kidney disease (HCC)   Incarcerated hiatal hernia s/p repair 09/08/2017   Immunosuppressed status (HCC)   Hypotension   Nausea and vomiting   DVT (deep venous thrombosis) (HCC)   Protein-calorie malnutrition, moderate (HCC)   Abscess of mediastinum (HCC)   Mediastinal post-op seromas   Pleural effusion on left   Sepsis 2/2 Mediastinal Abscess:  CT with "reaccumulation of fluid collection in lower mediastinum" Also possible small abscess along posterior margin of spleen. Thickening of herniated portion of stomach and esopagus, likely reactive [ ]  consider CT chest Continue zosyn/vanc Eraxis added by ID IVF IR consult, planning on attempting thoracentesis (did not feel candidate for perc drain - recommended discussing with GI for potential endoscopic drainage who also did not feel candidate, but if thought necessary consider at tertiary care center) CT surgery if requested by primary Blood cultures pending (fluid cultures from drainage when done) ID c/s, appreciate recs Hold off on PICC until cx negative x48 hours  Pyuria:  Urine cx pending Zosyn as above  Anemia: H/H fell to 6.8 yesterday afternoon.  Transfused 1 unit pRBC.  Repeat H/H pending this morning.  No obvious bleeding.   [ ]  repeat H/H pending (pt difficult stick) [ ]  hemoccult stool [ ]  iron, ferritin, b12, folate  Hypotension  Tachycardia:  2/2 above.  Some soft pressures overnight while sleeping, but pressures improved this morning.  BP improved Goal MAPs >65 Continue IVF  Leukocytosis 2/2 above  Decreased PO intake  Nausea Vomiting:  No N/V since yesterday morning.  Tolerated clears yesterday afternoon. Likely 2/2 above Surgery has c/s  pharm for TPA  Diarrhea:  C diff and GI pathogen panel ordered  Hypokalemia: F/u mag Replete prn  NAGMA:  2/2 NS administration, looks to be chronic as well on sodium bicarb. Fluids to 1/2 NS Bicarb  HTN: holding home spironolactone  RUE DVT: holding eliquis in preparation for possible procedure  History of crohns   No labs yet this morning as patient difficult stick.  Discussed with nursing who will have IV team try to draw.   DVT prophylaxis: SCD Code Status: full Family Communication: none Disposition Plan: pending improvement   Consultants:   Surgery, IR  Procedures: (Don't include imaging studies which can be auto populated. Include things that cannot be auto populated i.e. Echo, Carotid and venous dopplers, Foley, Bipap, HD, tubes/drains, wound vac, central lines etc)  pending  Antimicrobials: (specify start and planned stop date. Auto populated tables are space occupying and do not give end dates) Anti-infectives (From admission, onward)   Start     Dose/Rate Route Frequency Ordered Stop   10/16/17 1800  anidulafungin (ERAXIS) 100 mg in sodium chloride 0.9 % 100 mL IVPB     100 mg 78 mL/hr over 100 Minutes Intravenous Every 24 hours 10/15/17 1657     10/16/17 0600  vancomycin (VANCOCIN) IVPB 1000 mg/200 mL premix     1,000 mg 200 mL/hr over 60 Minutes Intravenous Every 36 hours 10/17/2017 2308     10/15/17 1800  anidulafungin (ERAXIS) 200 mg in sodium chloride 0.9 % 200 mL IVPB     200 mg 78 mL/hr over 200 Minutes Intravenous  Once 10/15/17 1657 10/15/17 2148   10/07/2017 2330  piperacillin-tazobactam (ZOSYN) IVPB 3.375 g  Status:  Discontinued     3.375 g 100 mL/hr over 30 Minutes Intravenous Every 8 hours 11/03/2017 2257 10/30/2017 2259   10/20/2017 2315  piperacillin-tazobactam (ZOSYN) IVPB 3.375 g     3.375 g 12.5 mL/hr over 240 Minutes Intravenous Every 8 hours 10/11/2017 2300     10/23/2017 1930  vancomycin (VANCOCIN) IVPB 1000 mg/200 mL premix     1,000 mg 200  mL/hr over 60 Minutes Intravenous  Once 10/26/2017 1915 10/31/2017 2044   10/24/2017 1530  piperacillin-tazobactam (ZOSYN) IVPB 3.375 g     3.375 g 100 mL/hr over 30 Minutes Intravenous  Once 10/12/2017 1529 10/15/2017 1604         Subjective: Feels ok today. No n/v.  Nothing bothering her this morning. Tolerated clears yesterday.   Objective: Vitals:   10/16/17 0000 10/16/17 0100 10/16/17 0400 10/16/17 0500  BP: (!) 83/48 (!) 87/44 (!) 117/56 111/60  Pulse: 88 87 93 87  Resp: (!) 27 (!) 24 20 18   Temp: 98 F (36.7 C) 98.1 F (36.7 C) 98.4 F (36.9 C)   TempSrc: Oral Oral Oral   SpO2: 100% 100% 100% 95%  Weight:      Height:        Intake/Output Summary (Last 24 hours) at 10/16/2017 0726 Last data filed at 10/16/2017 0600 Gross per 24 hour  Intake 2461.67 ml  Output 200 ml  Net 2261.67 ml   Filed Weights   10/28/2017 1248 10/15/17 1449  Weight: 59 kg (130 lb) 55.9 kg (123 lb 3.8 oz)    Examination:  General: No acute distress. Cardiovascular: Heart sounds show Littie Chiem regular rate, and rhythm.  Mild tachy. No gallops or rubs.  No murmurs. No JVD. Lungs: Clear to auscultation bilaterally with good air movement. No rales, rhonchi or wheezes. Abdomen: Soft, mildly TTP, nondistended with normal active bowel sounds. No masses. No hepatosplenomegaly.  Midline dressing Neurological: Alert and oriented 3. Moves all extremities 4 with equal strength. Cranial nerves II through XII grossly intact. Skin: Warm and dry. No rashes or lesions. Extremities: No clubbing or cyanosis. No edema. Pedal pulses 2+. Psychiatric: Mood and affect are normal. Insight and judgment are appropriate.  Data Reviewed: I have personally reviewed following labs and imaging studies  CBC: Recent Labs  Lab 10/18/2017 1405 10/15/17 0400 10/15/17 1651  WBC 23.0* 22.7*  --   NEUTROABS 14.3* 17.0*  --   HGB 12.3 8.2* 6.8*  HCT 37.5 25.3* 20.8*  MCV 85.2 86.6  --   PLT 276 359  --    Basic Metabolic  Panel: Recent Labs  Lab 10/09/2017 1405 10/15/17 0400  NA 133* 136  K 3.5 3.1*  CL 103 112*  CO2 18* 14*  GLUCOSE 84 95  BUN 17 13  CREATININE 1.29* 1.06*  CALCIUM 8.3* 7.4*  MG  --  1.3*  PHOS  --  2.1*   GFR: Estimated Creatinine Clearance: 44.2 mL/min (Toni Hoffmeister) (by C-G formula based on SCr of 1.06 mg/dL (H)). Liver Function Tests: Recent Labs  Lab 10/07/2017 1405 10/15/17 0400  AST 57* 37  ALT 34 25  ALKPHOS 95 81  BILITOT 1.1 0.6  PROT 8.3* 7.4  ALBUMIN 2.3* 2.1*   Recent Labs  Lab 10/27/2017 1405  LIPASE 34   No results for input(s): AMMONIA in the last 168 hours. Coagulation Profile: Recent Labs  Lab 10/15/2017 1405 10/15/17 0400  INR 1.69 1.52   Cardiac Enzymes: No results for input(s): CKTOTAL, CKMB, CKMBINDEX, TROPONINI in the last 168 hours. BNP (last 3 results) No results for input(s): PROBNP in the last 8760 hours. HbA1C: No results for input(s): HGBA1C in the last 72 hours. CBG: Recent Labs  Lab 10/15/17 1800 10/15/17 2314 10/16/17 0606  GLUCAP 104* 90 77   Lipid Profile: No results for input(s): CHOL, HDL, LDLCALC, TRIG, CHOLHDL, LDLDIRECT in the last 72 hours. Thyroid Function Tests: No results for input(s): TSH, T4TOTAL, FREET4, T3FREE, THYROIDAB in the last 72 hours. Anemia Panel: No results for input(s): VITAMINB12, FOLATE, FERRITIN, TIBC, IRON, RETICCTPCT in the last 72 hours. Sepsis Labs: Recent Labs  Lab 10/10/2017 1420  LATICACIDVEN 1.78    Recent Results (from the past 240 hour(s))  Culture, blood (Routine x 2)     Status: None (Preliminary result)   Collection Time: 10/26/2017  2:05 PM  Result Value Ref Range Status   Specimen Description BLOOD LEFT FOREARM  Final   Special Requests   Final    BOTTLES DRAWN AEROBIC AND ANAEROBIC Blood Culture results may not be optimal due to an inadequate volume of blood received in culture bottles   Culture   Final    NO GROWTH < 24 HOURS Performed at [facility], [place] [address],  [place], [place] [address]    Report Status PENDING  Incomplete  MRSA PCR Screening     Status: None   Collection Time: 10/12/2017 10:45 PM  Result Value Ref Range Status   MRSA by PCR NEGATIVE NEGATIVE Final    Comment:        The GeneXpert MRSA Assay (FDA approved for NASAL specimens only), is one component of Haroun Cotham comprehensive MRSA colonization surveillance program. It is not intended to diagnose  MRSA infection nor to guide or monitor treatment for MRSA infections.          Radiology Studies: Dg Chest 2 View  Result Date: 11/01/2017 CLINICAL DATA:  Dyspnea and possible sepsis. EXAM: CHEST  2 VIEW COMPARISON:  10/05/2017 CT chest.  09/2028 CXR. FINDINGS: Borderline cardiomegaly with minimal aortic atherosclerosis. There has been interval decrease in left-sided pleural effusion now small to moderate. Probable compressive atelectasis at the left lung base accounting for retrocardiac homogeneous opacity that still persists. Mild central vascular congestion is again re- demonstrated. No acute osseous abnormality. Chronic right-sided rib fractures. The patient's chin obscures the lung apices. Left-sided PICC line has been removed in the interim. IMPRESSION: 1. Borderline cardiomegaly with interval decrease in left pleural effusion. Persistent presumed compressive atelectasis at the left lung base and mild central vascular congestion. 2. Mild aortic atherosclerosis. Electronically Signed   By: Ashley Royalty M.D.   On: 10/27/2017 14:58   Ct Abdomen Pelvis W Contrast  Result Date: 10/11/2017 CLINICAL DATA:  Nausea, vomiting.  Decreased urine output. EXAM: CT ABDOMEN AND PELVIS WITH CONTRAST TECHNIQUE: Multidetector CT imaging of the abdomen and pelvis was performed using the standard protocol following bolus administration of intravenous contrast. CONTRAST:  173mL ISOVUE-300 IOPAMIDOL (ISOVUE-300) INJECTION 61% COMPARISON:  CT abdomen dated 10/01/2017. FINDINGS: Lower chest: Bibasilar pleural effusions,  left greater than right, incompletely imaged, with associated atelectasis. Interval reaccumulation of the fluid collection in the lower mediastinum, surrounding the herniated stomach, measuring approximately 8 cm transverse dimension by 3 cm greatest thickness, craniocaudal dimension incompletely imaged. This collection is now similar to the size seen on CT abdomen of 09/16/2017, when Aarushi Hemric drainage catheter was in place. Hepatobiliary: No focal liver abnormality is seen. Status post cholecystectomy. No biliary dilatation. Pancreas: Unremarkable. No pancreatic ductal dilatation or surrounding inflammatory changes. Spleen: Small complex collection along the posterior margin of the spleen, possibly abscess, measuring 3.3 x 1.3 cm. Spleen otherwise unremarkable. Adrenals/Urinary Tract: Adrenal glands appear normal. Kidneys are unremarkable without mass, stone or hydronephrosis. No ureteral or bladder calculi. Bladder is unremarkable. Stomach/Bowel: No dilated large or small bowel loops. No convincing evidence of bowel wall thickening or bowel wall inflammation within the abdomen or pelvis. There is at least some thickening/inflammation of the walls of the herniated stomach and lower esophagus, again incompletely imaged, likely reactive to the adjacent fluid collection. Vascular/Lymphatic: No significant vascular findings are present. No enlarged abdominal or pelvic lymph nodes. Reproductive: No adnexal mass or free fluid. Other: No free intraperitoneal air. Musculoskeletal: No acute or suspicious osseous finding. IMPRESSION: 1. Reaccumulation of the previously demonstrated fluid collection in the lower mediastinum, presumably abscess, likely multiloculated, surrounding the herniated portion of the stomach and lower esophagus, measuring 8 x 3 cm (transverse by AP dimensions), craniocaudal extent incompletely imaged. The size of the fluid collection is now similar to the size seen on CT abdomen of 09/16/2017, at which point  there were drainage catheters in place. 2. Thickening of the walls of the herniated portion of the stomach and the overlying esophagus, incompletely imaged, likely reactive to the adjacent fluid collection/abscess. 3. Bibasilar pleural effusions, left greater than right, incompletely imaged, with associated atelectasis. 4. Small complex collection along the posterior margin of the spleen, possibly additional small abscess, measuring 3.3 x 1.3 cm. 5. No evidence of bowel obstruction within the abdomen or pelvis. Recommend chest CT to determine the entire craniocaudal extent of the mediastinal fluid collection. These results were called by telephone at the time of interpretation  on 10/24/2017 at 5:45 pm to Dr. Vanita Panda, who verbally acknowledged these results. Electronically Signed   By: Franki Cabot M.D.   On: 10/10/2017 17:51        Scheduled Meds: . feeding supplement  1 Container Oral TID BM  . sodium bicarbonate  650 mg Oral BID   Continuous Infusions: . 0.45 % NaCl with KCl 20 mEq / L 100 mL/hr at 10/15/17 1828  . anidulafungin    . piperacillin-tazobactam (ZOSYN)  IV 3.375 g (10/16/17 3329)  . vancomycin Stopped (10/16/17 0713)     LOS: 2 days    Time spent: over 20 minutes    Fayrene Helper, MD [facility] Pager [phone]  If 7PM-7AM, please contact night-coverage [email or web address] Password [email or web address] 10/16/2017, 7:26 AM

## 2017-10-16 NOTE — Progress Notes (Signed)
[place] pharmacy pt with [facility] just prior to this readmission.    [facility] Infusion Coordinator will follow pt this admission along with the ID team to support Home Infusion needs at [facility].  If patient discharges after hours, please call [phone].   [name] 10/16/2017, 7:50 AM

## 2017-10-16 NOTE — Progress Notes (Signed)
Patient continues to refuse thoracentesis.  Order removed.  Please replace the order if the patient changes her mind.  Danielle [name] E 9:11 AM 10/16/2017

## 2017-10-17 DIAGNOSIS — Z9119 Patient's noncompliance with other medical treatment and regimen: Secondary | ICD-10-CM

## 2017-10-17 DIAGNOSIS — R6521 Severe sepsis with septic shock: Secondary | ICD-10-CM

## 2017-10-17 DIAGNOSIS — A419 Sepsis, unspecified organism: Secondary | ICD-10-CM

## 2017-10-17 DIAGNOSIS — Z91199 Patient's noncompliance with other medical treatment and regimen due to unspecified reason: Secondary | ICD-10-CM

## 2017-10-17 LAB — GASTROINTESTINAL PANEL BY PCR, STOOL (REPLACES STOOL CULTURE)
ASTROVIRUS: NOT DETECTED
Adenovirus F40/41: NOT DETECTED
CYCLOSPORA CAYETANENSIS: NOT DETECTED
Campylobacter species: NOT DETECTED
Cryptosporidium: NOT DETECTED
ENTAMOEBA HISTOLYTICA: NOT DETECTED
ENTEROAGGREGATIVE E COLI (EAEC): NOT DETECTED
ENTEROPATHOGENIC E COLI (EPEC): NOT DETECTED
ENTEROTOXIGENIC E COLI (ETEC): NOT DETECTED
GIARDIA LAMBLIA: NOT DETECTED
NOROVIRUS GI/GII: NOT DETECTED
Plesimonas shigelloides: NOT DETECTED
Rotavirus A: NOT DETECTED
SAPOVIRUS (I, II, IV, AND V): NOT DETECTED
SHIGA LIKE TOXIN PRODUCING E COLI (STEC): NOT DETECTED
Salmonella species: NOT DETECTED
Shigella/Enteroinvasive E coli (EIEC): NOT DETECTED
VIBRIO CHOLERAE: NOT DETECTED
VIBRIO SPECIES: NOT DETECTED
Yersinia enterocolitica: NOT DETECTED

## 2017-10-17 LAB — COMPREHENSIVE METABOLIC PANEL
ALK PHOS: 71 U/L (ref 38–126)
ALT: 14 U/L (ref 14–54)
AST: 23 U/L (ref 15–41)
Albumin: 1.8 g/dL — ABNORMAL LOW (ref 3.5–5.0)
Anion gap: 7 (ref 5–15)
BILIRUBIN TOTAL: 1 mg/dL (ref 0.3–1.2)
BUN: 5 mg/dL — AB (ref 6–20)
CALCIUM: 7.4 mg/dL — AB (ref 8.9–10.3)
CO2: 13 mmol/L — ABNORMAL LOW (ref 22–32)
CREATININE: 1.01 mg/dL — AB (ref 0.44–1.00)
Chloride: 114 mmol/L — ABNORMAL HIGH (ref 101–111)
GFR calc Af Amer: >60 mL/min (ref >60)
GFR calc non Af Amer: 55 mL/min — ABNORMAL LOW (ref >60)
GLUCOSE: 75 mg/dL (ref 65–99)
Potassium: 4.4 mmol/L (ref 3.5–5.1)
Sodium: 134 mmol/L — ABNORMAL LOW (ref 135–145)
TOTAL PROTEIN: 6.3 g/dL — AB (ref 6.5–8.1)

## 2017-10-17 LAB — CBC WITH DIFFERENTIAL/PLATELET
BASOS ABS: 0 10*3/uL (ref 0.0–0.1)
BASOS PCT: 0 %
Eosinophils Absolute: 0.3 10*3/uL (ref 0.0–0.7)
Eosinophils Relative: 1 %
HEMATOCRIT: 26.2 % — AB (ref 36.0–46.0)
Hemoglobin: 8.6 g/dL — ABNORMAL LOW (ref 12.0–15.0)
LYMPHS ABS: 8.9 10*3/uL — AB (ref 0.7–4.0)
Lymphocytes Relative: 31 %
MCH: 28.3 pg (ref 26.0–34.0)
MCHC: 32.8 g/dL (ref 30.0–36.0)
MCV: 86.2 fL (ref 78.0–100.0)
MONOS PCT: 3 %
Monocytes Absolute: 0.9 10*3/uL (ref 0.1–1.0)
NEUTROS ABS: 18.6 10*3/uL — AB (ref 1.7–7.7)
Neutrophils Relative %: 65 %
PLATELETS: 444 10*3/uL — AB (ref 150–400)
RBC: 3.04 MIL/uL — ABNORMAL LOW (ref 3.87–5.11)
RDW: 17.7 % — AB (ref 11.5–15.5)
WBC: 28.7 10*3/uL — ABNORMAL HIGH (ref 4.0–10.5)

## 2017-10-17 LAB — MAGNESIUM: Magnesium: 2 mg/dL (ref 1.7–2.4)

## 2017-10-17 LAB — PHOSPHORUS: Phosphorus: 2.5 mg/dL (ref 2.5–4.6)

## 2017-10-17 MED ORDER — DIPHENOXYLATE-ATROPINE 2.5-0.025 MG/5ML PO LIQD
5.0000 mL | Freq: Four times a day (QID) | ORAL | Status: DC | PRN
  Filled 2017-10-17: qty 5

## 2017-10-17 MED ORDER — STERILE WATER FOR INJECTION IV SOLN
INTRAVENOUS | Status: DC
  Administered 2017-10-17 – 2017-10-18 (×2): via INTRAVENOUS
  Filled 2017-10-17 (×3): qty 850

## 2017-10-17 MED ORDER — METOCLOPRAMIDE HCL 5 MG/ML IJ SOLN
5.0000 mg | Freq: Three times a day (TID) | INTRAMUSCULAR | Status: DC
  Administered 2017-10-17 – 2017-10-20 (×11): 5 mg via INTRAVENOUS
  Filled 2017-10-17 (×11): qty 2

## 2017-10-17 MED ORDER — SODIUM CHLORIDE 0.9% FLUSH
10.0000 mL | Freq: Two times a day (BID) | INTRAVENOUS | Status: DC
  Administered 2017-10-17 – 2017-10-20 (×4): 10 mL

## 2017-10-17 MED ORDER — SODIUM BICARBONATE 650 MG PO TABS
650.0000 mg | ORAL_TABLET | Freq: Three times a day (TID) | ORAL | Status: DC
  Administered 2017-10-18 – 2017-10-19 (×3): 650 mg via ORAL
  Filled 2017-10-17 (×6): qty 1

## 2017-10-17 MED ORDER — FAT EMULSION 20 % IV EMUL
240.0000 mL | INTRAVENOUS | Status: AC
  Administered 2017-10-17: 240 mL via INTRAVENOUS
  Filled 2017-10-17: qty 250

## 2017-10-17 MED ORDER — TRACE MINERALS CR-CU-MN-SE-ZN 10-1000-500-60 MCG/ML IV SOLN
INTRAVENOUS | Status: AC
  Administered 2017-10-17: 19:00:00 via INTRAVENOUS
  Filled 2017-10-17: qty 720

## 2017-10-17 MED ORDER — CHLORHEXIDINE GLUCONATE CLOTH 2 % EX PADS
6.0000 | MEDICATED_PAD | Freq: Every day | CUTANEOUS | Status: DC
Start: 2017-10-17 — End: 2017-10-21
  Administered 2017-10-17 – 2017-10-20 (×3): 6 via TOPICAL

## 2017-10-17 MED ORDER — SODIUM CHLORIDE 0.9% FLUSH
10.0000 mL | INTRAVENOUS | Status: DC | PRN
  Administered 2017-10-20: 10 mL
  Filled 2017-10-17: qty 40

## 2017-10-17 MED ORDER — CHLORHEXIDINE GLUCONATE CLOTH 2 % EX PADS
6.0000 | MEDICATED_PAD | Freq: Every day | CUTANEOUS | Status: DC
  Administered 2017-10-19: 6 via TOPICAL

## 2017-10-17 NOTE — Progress Notes (Signed)
PROGRESS NOTE    Danielle [name]  NTI:[id number] DOB: 10/20/1946 DOA: 10/31/2017 PCP: Iona Beard, MD    Brief Narrative:  Danielle [name] is a 71 y.o. female with medical history significant of Crohn's disease, hiatal hernia, left buttock abscess, psoriasis, small bowel obstruction who has been recently admitted in the discharge twice in the past 5 weeks, first from 09/08/2017 to 09/26/2017 after undergoing type IV hiatal hernia repair.  Postoperatively the patient required endotracheal intubation, mechanical ventilation and the use of pressors.  She was extubated on postop day #2.  She improved clinically and was on parenteral nutrition, however she developed a pelvic fluid collection which require a drain placement.  On 09/11/2017 the patient blood cultures were positive for Candida glabrata and abscess culture were positive for C. Albicans and C. Glabrata. She was given treatment with parenteral antifungals. She subsequently improved was able to ambulate with physical therapy, weaned off of parenteral nutrition and was tolerating oral diet. ID recommended to continue Invanz and Erasis for 2 weeks. A PICC line was placed in her right upper arm and she was subsequently discharged home.  She returned on 10/01/2017 due to intermittent fevers.  She was placed on the neck slowly and cultures were obtained.  Antibiotics were discontinue 24 hours prior to discharge and the patient remained afebrile and clinically improved.  However, she returns today with complaints of persistent nausea associated with about 3 episodes of emesis daily.  She has also had loose stools, but stated that this is her baseline since she suffers from Crohn's disease. She denies fever, chills, rhinorrhea, sore throat, productive cough, dyspnea, chest pain, palpitations, dizziness, diaphoresis, PND, orthopnea or pitting edema of the lower extremities. She denies constipation, melena or hematochezia.  She denies dysuria, frequency or  hematuria.  Denies polyuria, polydipsia or blurred vision.  Assessment & Plan:   Principal Problem:   Septic shock ([place]) Active Problems:   Leukocytosis   Perianal Crohn's disease, with fistula ([place])   Acute renal failure superimposed on stage 3 chronic kidney disease ([place])   Incarcerated hiatal hernia s/p reduction 09/08/2017   Immunosuppressed status ([facility])   Hypotension   Nausea and vomiting   Anasarca   DVT (deep venous thrombosis) ([facility])   Protein-calorie malnutrition, moderate ([facility])   Mediastinal post-op seromas   Pleural effusion on left   Patient's noncompliance with intervention / medical advice    Sepsis 2/2 Mediastinal Abscess:  CT with "reaccumulation of fluid collection in lower mediastinum" Also possible small abscess along posterior margin of spleen. Thickening of herniated portion of stomach and esopagus, likely reactive [ ]  consider CT chest Continue zosyn/vanc Eraxis added by ID IVF IR consult, planning on attempting thoracentesis (did not feel candidate for perc drain - recommended discussing with GI for potential endoscopic drainage who also did not feel candidate, but if thought necessary consider at tertiary care center) [ ]  Will reorder Thoracentesis after PICC (she's been refusing procedures until this has been done) CT surgery if requested by surgery Blood cultures pending (fluid cultures from drainage when done) ID c/s, appreciate recs PICC to be placed this morning  Pyuria:  Urine cx (never added on) Zosyn as above  Anemia: H/H fell to 6.8 11/9 afternoon.  Transfused 1 unit pRBC.  Repeat H/H 9.2/28.1.   [ ]  hemoccult stool [ ]  iron, ferritin, b12, folate   Hypotension  Tachycardia:  2/2 above.  Improved. BP improved Goal MAPs >65 Continue IVF  Leukocytosis 2/2 above  Decreased PO  intake  Nausea Vomiting:  Only tolerating minimal clears Likely 2/2 above Surgery has c/s pharm for TPA  Diarrhea:  C diff negative.  GI pathogen panel  pending  Hypokalemia  Hypomagnesemia: F/u mag Replete prn  NAGMA:  2/2 NS administration, looks to be chronic as well on sodium bicarb. Fluids to LR Continue sodium bicarb  HTN: holding home spironolactone  RUE DVT: holding eliquis in preparation for possible procedure  History of crohns   PICC this AM  DVT prophylaxis: SCD Code Status: full Family Communication: none Disposition Plan: pending improvement   Consultants:   Surgery, IR, ID  Procedures: (Don't include imaging studies which can be auto populated. Include things that cannot be auto populated i.e. Echo, Carotid and venous dopplers, Foley, Bipap, HD, tubes/drains, wound vac, central lines etc)  pending  Antimicrobials: (specify start and planned stop date. Auto populated tables are space occupying and do not give end dates) Anti-infectives (From admission, onward)   Start     Dose/Rate Route Frequency Ordered Stop   10/17/17 0800  vancomycin (VANCOCIN) IVPB 750 mg/150 ml premix     750 mg 150 mL/hr over 60 Minutes Intravenous Every 24 hours 10/16/17 1220     10/16/17 1800  anidulafungin (ERAXIS) 100 mg in sodium chloride 0.9 % 100 mL IVPB     100 mg 78 mL/hr over 100 Minutes Intravenous Every 24 hours 10/15/17 1657     10/16/17 0600  vancomycin (VANCOCIN) IVPB 1000 mg/200 mL premix  Status:  Discontinued     1,000 mg 200 mL/hr over 60 Minutes Intravenous Every 36 hours 11/01/2017 2308 10/16/17 1220   10/15/17 1800  anidulafungin (ERAXIS) 200 mg in sodium chloride 0.9 % 200 mL IVPB     200 mg 78 mL/hr over 200 Minutes Intravenous  Once 10/15/17 1657 10/15/17 2148   10/13/2017 2330  piperacillin-tazobactam (ZOSYN) IVPB 3.375 g  Status:  Discontinued     3.375 g 100 mL/hr over 30 Minutes Intravenous Every 8 hours 10/24/2017 2257 11/01/2017 2259   10/26/2017 2315  piperacillin-tazobactam (ZOSYN) IVPB 3.375 g     3.375 g 12.5 mL/hr over 240 Minutes Intravenous Every 8 hours 11/03/2017 2300     11/04/2017 1930  vancomycin  (VANCOCIN) IVPB 1000 mg/200 mL premix     1,000 mg 200 mL/hr over 60 Minutes Intravenous  Once 10/17/2017 1915 11/04/2017 2044   10/13/2017 1530  piperacillin-tazobactam (ZOSYN) IVPB 3.375 g     3.375 g 100 mL/hr over 30 Minutes Intravenous  Once 10/13/2017 1529 11/04/2017 1604         Subjective: N/V last night. Feels ok this morning. Agreeable to procedure after PICC.   Objective: Vitals:   10/17/17 0407 10/17/17 0500 10/17/17 0600 10/17/17 0659  BP:  (!) 92/46 (!) 133/59   Pulse:   (!) 103 99  Resp:  (!) 31 (!) 37 (!) 30  Temp: 99.1 F (37.3 C)     TempSrc: Oral     SpO2:   94% 100%  Weight:      Height:        Intake/Output Summary (Last 24 hours) at 10/17/2017 0805 Last data filed at 10/17/2017 0600 Gross per 24 hour  Intake 1955 ml  Output 900 ml  Net 1055 ml   Filed Weights   11/04/2017 1248 10/15/17 1449  Weight: 59 kg (130 lb) 55.9 kg (123 lb 3.8 oz)    Examination:  General: No acute distress. Cardiovascular: Heart sounds show a regular rate, and rhythm. No  gallops or rubs. No murmurs. No JVD. Lungs: Clear to auscultation bilaterally with good air movement. No rales, rhonchi or wheezes. Abdomen: Soft, nontender, nondistended with normal active bowel sounds. No masses. No hepatosplenomegaly. Neurological: Alert and oriented 3. Moves all extremities 4 with equal strength. Cranial nerves II through XII grossly intact. Skin: Warm and dry. No rashes or lesions. Extremities: No clubbing or cyanosis.  Psychiatric: Mood and affect are normal. Insight and judgment are appropriate.  Data Reviewed: I have personally reviewed following labs and imaging studies  CBC: Recent Labs  Lab 10/18/2017 1405 10/15/17 0400 10/15/17 1651 10/16/17 1346  WBC 23.0* 22.7*  --  29.9*  NEUTROABS 14.3* 17.0*  --   --   HGB 12.3 8.2* 6.8* 9.2*  HCT 37.5 25.3* 20.8* 28.1*  MCV 85.2 86.6  --  86.2  PLT 276 359  --  193   Basic Metabolic Panel: Recent Labs  Lab 10/26/2017 1405  10/15/17 0400 10/16/17 1346  NA 133* 136 135  K 3.5 3.1* 3.6  CL 103 112* 115*  CO2 18* 14* 10*  GLUCOSE 84 95 75  BUN 17 13 6   CREATININE 1.29* 1.06* 1.03*  CALCIUM 8.3* 7.4* 7.6*  MG  --  1.3* 1.4*  PHOS  --  2.1* 2.1*   GFR: Estimated Creatinine Clearance: 45.5 mL/min (A) (by C-G formula based on SCr of 1.03 mg/dL (H)). Liver Function Tests: Recent Labs  Lab 10/17/2017 1405 10/15/17 0400 10/16/17 1346  AST 57* 37 23  ALT 34 25 16  ALKPHOS 95 81 72  BILITOT 1.1 0.6 0.9  PROT 8.3* 7.4 6.4*  ALBUMIN 2.3* 2.1* 1.9*   Recent Labs  Lab 11/04/2017 1405  LIPASE 34   No results for input(s): AMMONIA in the last 168 hours. Coagulation Profile: Recent Labs  Lab 10/27/2017 1405 10/15/17 0400  INR 1.69 1.52   Cardiac Enzymes: No results for input(s): CKTOTAL, CKMB, CKMBINDEX, TROPONINI in the last 168 hours. BNP (last 3 results) No results for input(s): PROBNP in the last 8760 hours. HbA1C: No results for input(s): HGBA1C in the last 72 hours. CBG: Recent Labs  Lab 10/15/17 1800 10/15/17 2314 10/16/17 0606 10/16/17 1125  GLUCAP 104* 90 77 74   Lipid Profile: No results for input(s): CHOL, HDL, LDLCALC, TRIG, CHOLHDL, LDLDIRECT in the last 72 hours. Thyroid Function Tests: No results for input(s): TSH, T4TOTAL, FREET4, T3FREE, THYROIDAB in the last 72 hours. Anemia Panel: No results for input(s): VITAMINB12, FOLATE, FERRITIN, TIBC, IRON, RETICCTPCT in the last 72 hours. Sepsis Labs: Recent Labs  Lab 10/16/2017 1420  LATICACIDVEN 1.78    Recent Results (from the past 240 hour(s))  Culture, blood (Routine x 2)     Status: None (Preliminary result)   Collection Time: 10/09/2017  2:05 PM  Result Value Ref Range Status   Specimen Description BLOOD LEFT FOREARM  Final   Special Requests   Final    BOTTLES DRAWN AEROBIC AND ANAEROBIC Blood Culture results may not be optimal due to an inadequate volume of blood received in culture bottles   Culture   Final    NO  GROWTH 2 DAYS Performed at [facility], [place] [address] [place], [place] [address]    Report Status PENDING  Incomplete  MRSA PCR Screening     Status: None   Collection Time: 10/23/2017 10:45 PM  Result Value Ref Range Status   MRSA by PCR NEGATIVE NEGATIVE Final    Comment:  The GeneXpert MRSA Assay (FDA approved for NASAL specimens only), is one component of a comprehensive MRSA colonization surveillance program. It is not intended to diagnose MRSA infection nor to guide or monitor treatment for MRSA infections.   Culture, blood (Routine x 2)     Status: None (Preliminary result)   Collection Time: 10/12/2017 11:28 PM  Result Value Ref Range Status   Specimen Description BLOOD LEFT HAND  Final   Special Requests IN PEDIATRIC BOTTLE Blood Culture adequate volume  Final   Culture   Final    NO GROWTH 1 DAY Performed at [facility], [place] [address], [place], [place] [address]    Report Status PENDING  Incomplete  C difficile quick scan w PCR reflex     Status: None   Collection Time: 10/16/17  2:47 PM  Result Value Ref Range Status   C Diff antigen NEGATIVE NEGATIVE Final   C Diff toxin NEGATIVE NEGATIVE Final   C Diff interpretation No C. difficile detected.  Final         Radiology Studies: No results found.      Scheduled Meds: . feeding supplement  1 Container Oral TID BM  . lip balm  1 application Topical BID  . psyllium  1 packet Oral Daily  . sodium bicarbonate  650 mg Oral BID   Continuous Infusions: . anidulafungin Stopped (10/16/17 1903)  . lactated ringers 100 mL/hr at 10/17/17 0629  . piperacillin-tazobactam (ZOSYN)  IV 3.375 g (10/17/17 0525)  . vancomycin       LOS: 3 days    Time spent: over 20 minutes    Fayrene Helper, MD [facility] Pager [phone]  If 7PM-7AM, please contact night-coverage [email or web address] Password [email or web address] 10/17/2017, 8:05 AM

## 2017-10-17 NOTE — Consult Note (Signed)
         [facility] for Infectious Disease    Date of Admission:  10/15/2017   Day 4 vancomycin        Day 4 piperacillin tazobactam        Day 3 anidulafungin  She has been afebrile since admission. Blood cultures are negative. A PICC has been placed. Given that she had a temperature of 102.9 upon admission and a new mediastinal fluid collection I feel we should assume that this represents an abscess. I do not think that she has had asymptomatic urinary tract infection. I will continue piperacillin tazobactam and anidulafungin. I think it is safe and reasonable to stop vancomycin now.         Michel Bickers, MD [facility] [phone] pager   [phone] cell 10/17/2017, 10:20 AM

## 2017-10-17 NOTE — Progress Notes (Signed)
[place] NOTE   Pharmacy Consult for TPN Indication: poor po intake, unable to place repeat Gtube  Patient Measurements: Height: 5' 4.5" (163.8 cm) Weight: 123 lb 3.8 oz (55.9 kg) IBW/kg (Calculated) : 55.85 TPN AdjBW (KG): 59 Body mass index is 20.83 kg/m.  Central access: helding PICC placement to r/o no bacteremia, PICC placed 11/11 TPN start date: 11/11  ASSESSMENT                                                                                                          HPI: 2 yoF re-admit 11/8 with N/V, loose stools (baseline w/Crohn's). Recurrent mediastinal fluid abscess, possible splenic abscess on CT PMH: Crohns, HH, hx SBO Significant events: 10/3 - 10/21 admit for [facility] repair, ICU post-op on vent & pressors, extubated POD2, was on TPN, pelvic fluid collection-C albicans & C glabrata tx with Eraxis & Ertapenem x 2 wk total 10/26 admit with fevers, tx with Zosyn  Significant events:  11/9: repletion of electrolytes Magnesium 2gm bolus, Phosphorus, KCl, refusing Bicarb tablets 11/10: Boost tid, electrolyte repletion, Mag 4gm bolus, K Phos 20 mMol bolus, KCL runs x2 11/11: PICC placed, no am labs to aid in electrolyte repletion  Today:   Glucose - unknown  Electrolytes - no lab this am, required daily repletion  Renal -was wnl  LFTs - were wnl  TGs - tomorrow  Prealbumin - tomorrow  Insulin Requirements: no hx DM, assess need for insulin coverage  Current Nutrition: NPO  IVF: none   NUTRITIONAL GOALS                                                                                             RD recs: 60-70 gm protein, 1475-1170 kCal per 24 hr  Clinimix 5/15 at a goal rate of 55 ml/hr + 20% fat emulsion at 74ml/hr to provide: 66 g/day protein, 1465 Kcal/day.  PLAN                                                                                                                         No am labs drawn, unable to replete  electrolytes  Begin Clinimix E 5/15 at 31ml/hr  20% fat emulsion at 42ml/hr.  Plan to advance as tolerated to the goal rate.  TPN to contain standard multivitamins and trace elements.  Consider need for SSI based on serum glucose  TPN lab panels on Mondays & Thursdays.  F/u daily.  Minda Ditto PharmD Pager [phone] 10/17/2017, 11:13 AM

## 2017-10-17 NOTE — Progress Notes (Signed)
Danielle  [name]., Danielle [name], [place] [address] Phone: [phone]  FAX: [phone]      Danielle [name] [id number] 1946-01-30  CARE TEAM:  PCP: Danielle Beard, MD  Outpatient Care Team: Patient Care Team: Danielle Beard, MD as PCP - General (Family Medicine) Danielle Irani, MD as Consulting Physician (Gastroenterology) Danielle Houston, MD as Consulting Physician (Gastroenterology) Danielle Keels, MD (Inactive) as Consulting Physician (General Surgery) Danielle Ruff, MD as Consulting Physician (General Surgery) Danielle Ok, MD as Consulting Physician (General Surgery) Danielle Riches, MD as Consulting Physician (Infectious Diseases) Danielle Ok, MD as Consulting Physician (General Surgery)  Inpatient Treatment Team: Treatment Team: Attending Provider: Elodia [name]., MD; Consulting Physician: Danielle Ok, MD; Rounding Team: Danielle Das, MD; Consulting Physician: Danielle Pace, Md, MD; Technician: Danielle [name], [place]; Registered Nurse: Danielle Kearns, RN   Problem List:   Principal Problem:   Septic shock [facility]) Active Problems:   Perianal Crohn's disease, with fistula ([place])   Leukocytosis   Acute renal failure superimposed on stage 3 chronic kidney disease ([place])   Incarcerated hiatal hernia s/p repair 09/08/2017   Immunosuppressed status ([place])   Hypotension   Nausea and vomiting   Anasarca   DVT (deep venous thrombosis) ([place])   Protein-calorie malnutrition, moderate ([place])   Mediastinal post-op seromas   Pleural effusion on left   Patient's noncompliance with intervention / medical advice    09/08/2017   POST-OPERATIVE DIAGNOSIS:  Type 4 paraesophageal hiatal hernia   PROCEDURE:  DIAGNOSTIC LAPAROSCOPIC PLACEMENT OF G-TUBE AND GASTROPEXY (N/A) LAPAROSCOPIC LYSIS OF ADHESIONS45 minutes (N/A)  SURGEON:  Danielle Ok, MD - Primary  09/10/2017  Post-op diagnosis/intraop findings:    moderate volume pneumoperitoneum and purulent peritonitis, possible leakage at g tube site  Procedure:   exploratory laparotomy, takedown of gastrostomy and repair of gastrotomy, washout and placement of mediastinal drains, hepatorrhaphy x 2    Assessment  Mediastinal fluid collections.  Probable postoperative seromas.  Possible infection.  Plan:  -Discussed prior notes, reasonable to try and tap some of the fluid off to help determine if it is truly infected.  Compromised to do thoracentesis drain placement better.  Patient has refused this twice.  However today she tells the nurse she is open to doing it.  Consider esophagram to rule out any distal esophageal obstruction.  I doubt she really is obstructed and just has poor p.o. effort/intake.  That was the reason of the gastrostomy tube to get her enteral nutrition until she could eat better.  Unfortunately, she leaked.  I am skeptical that she currently has infected mediastinal abscesses.  Discussed with Dr. Merilynn Finland with cardiothoracic surgery yesterday.  He is skeptical that someone with a mediastinal abscess will not be clinical horrible of septic shock, etc.  Especially in this fragile lady.  If the patient worsens or has more definite evidence of infection that does not resolve with parenteral nutrition, bowel rest, IV antibiotics; then, can reconsult them to discuss VATS.  She is refusing a thoracentesis, feels she probably will refuse that anyway.  -Continue antibiotics at this time.  Prior fluid collection growing Candida glabrata and albicans.  Antifungal therapy.  Infectious disease involved.  Probable urinary tract infection.  -Nausea vomiting returned a little bit.  Do low-volume clears.  Add Reglan around-the-clock.  I wonder if it is more delayed gastric emptying than anything else.  Esophagram tomorrow to rule out any true obstruction   -Parenteral nutrition given poor  p.o. intake.  Ideally would get feeding  tube, but since she broke down the last one, understandably hesitant to try again.   -Transfuse as needed for anemia. -PT/OT -VTE prophylaxis- SCDs, etc -mobilize as tolerated to help recovery  30 minutes spent in review, evaluation, examination, counseling, and coordination of care.  More than 50% of that time was spent in counseling.  I updated the patient's status to the patient and nurse.  Also discussed with Dr. Florene Glen who is primary.  Recommendations were made.  Questions were answered.  They expressed understanding & appreciation.   Danielle [name], M.D., F.A.C.S. Gastrointestinal and Minimally Invasive Surgery [facility] [address] [place], [place] [address] [phone] Main / Paging   10/17/2017    Subjective: (Chief complaint)  Nausea vomiting returned.  Still with some nausea but wants to eat. Was refusing thoracentesis.  Now interested in proceeding Denies abdominal pain. Refusing care until she gets the PICC line. Getting PICC line placed.  Objective:  Vital signs:  Vitals:   10/17/17 0407 10/17/17 0500 10/17/17 0600 10/17/17 0659  BP:  (!) 92/46 (!) 133/59   Pulse:   (!) 103 99  Resp:  (!) 31 (!) 37 (!) 30  Temp: 99.1 F (37.3 C)     TempSrc: Oral     SpO2:   94% 100%  Weight:      Height:        Last BM Date: 10/16/17  Intake/Output   Yesterday:  11/10 0701 - 11/11 0700 In: 1955 [I.V.:1345; IV Piggyback:610] Out: 900 [Urine:900] This shift:  No intake/output data recorded.  Bowel function:  Flatus: YES  BM:  No  Drain: Serosanguinous   Physical Exam:  General: Pt awake/alert/oriented x4 in no acute distress Eyes: PERRL, normal EOM.  Sclera clear.  No icterus Neuro: CN II-XII intact w/o focal sensory/motor deficits. Lymph: No head/neck/groin lymphadenopathy Psych:  No delerium/psychosis/paranoia HENT: Normocephalic, Mucus membranes moist.  No thrush Neck: Supple, No tracheal  deviation Chest: No chest wall pain w good excursion CV:  Pulses intact.  Regular rhythm MS: Normal AROM mjr joints.  Signif kyphoscoliosis   Abdomen: Soft.  Nondistended.  Nontender.  Wound 4 x 2 cm superficial granulation.  No evidence of peritonitis.  No incarcerated hernias.  Rectal - perc drain inplace Ext:  No deformity.  No mjr edema.  No cyanosis Skin: No petechiae / purpura  Results:   Labs: Results for orders placed or performed during the hospital encounter of 11/04/2017 (from the past 48 hour(s))  Occult blood card to lab, stool     Status: None   Collection Time: 10/15/17  2:51 PM  Result Value Ref Range   Fecal Occult Bld NEGATIVE NEGATIVE  Hemoglobin and hematocrit, blood     Status: Abnormal   Collection Time: 10/15/17  4:51 PM  Result Value Ref Range   Hemoglobin 6.8 (LL) 12.0 - 15.0 g/dL    Comment: REPEATED TO VERIFY CRITICAL RESULT CALLED TO, READ BACK BY AND VERIFIED WITH: MCNABB,B. RN _0  ON 11.09.18 BY COHEN,K    HCT 20.8 (L) 36.0 - 46.0 %  Glucose, capillary     Status: Abnormal   Collection Time: 10/15/17  6:00 PM  Result Value Ref Range   Glucose-Capillary 104 (H) 65 - 99 mg/dL   Comment 1 Notify RN    Comment 2 Document in Chart   Type and screen [place]     Status: None  Collection Time: 10/15/17  6:03 PM  Result Value Ref Range   ABO/RH(D) O POS    Antibody Screen NEG    Sample Expiration 10/18/2017    Unit Number [id number]    Blood Component Type RED CELLS,LR    Unit division 00    Status of Unit ISSUED,FINAL    Transfusion Status [name] TO TRANSFUSE    Crossmatch Result Compatible   Prepare RBC     Status: None   Collection Time: 10/15/17  8:00 PM  Result Value Ref Range   Order Confirmation ORDER PROCESSED BY BLOOD BANK   Glucose, capillary     Status: None   Collection Time: 10/15/17 11:14 PM  Result Value Ref Range   Glucose-Capillary 90 65 - 99 mg/dL   Comment 1 Notify RN    Comment 2 Document in Chart    Glucose, capillary     Status: None   Collection Time: 10/16/17  6:06 AM  Result Value Ref Range   Glucose-Capillary 77 65 - 99 mg/dL   Comment 1 Notify RN    Comment 2 Document in Chart   Glucose, capillary     Status: None   Collection Time: 10/16/17 11:25 AM  Result Value Ref Range   Glucose-Capillary 74 65 - 99 mg/dL   Comment 1 Notify RN    Comment 2 Document in Chart   CBC     Status: Abnormal   Collection Time: 10/16/17  1:46 PM  Result Value Ref Range   WBC 29.9 (H) 4.0 - 10.5 K/uL    Comment: RESULT REPEATED AND VERIFIED   RBC 3.26 (L) 3.87 - 5.11 MIL/uL   Hemoglobin 9.2 (L) 12.0 - 15.0 g/dL    Comment: RESULT REPEATED AND VERIFIED POST TRANSFUSION SPECIMEN    HCT 28.1 (L) 36.0 - 46.0 %   MCV 86.2 78.0 - 100.0 fL   MCH 28.2 26.0 - 34.0 pg   MCHC 32.7 30.0 - 36.0 g/dL   RDW 17.5 (H) 11.5 - 15.5 %   Platelets 370 150 - 400 K/uL  Magnesium     Status: Abnormal   Collection Time: 10/16/17  1:46 PM  Result Value Ref Range   Magnesium 1.4 (L) 1.7 - 2.4 mg/dL  Phosphorus     Status: Abnormal   Collection Time: 10/16/17  1:46 PM  Result Value Ref Range   Phosphorus 2.1 (L) 2.5 - 4.6 mg/dL  Comprehensive metabolic panel     Status: Abnormal   Collection Time: 10/16/17  1:46 PM  Result Value Ref Range   Sodium 135 135 - 145 mmol/L   Potassium 3.6 3.5 - 5.1 mmol/L   Chloride 115 (H) 101 - 111 mmol/L   CO2 10 (L) 22 - 32 mmol/L   Glucose, Bld 75 65 - 99 mg/dL   BUN 6 6 - 20 mg/dL   Creatinine, Ser 1.03 (H) 0.44 - 1.00 mg/dL   Calcium 7.6 (L) 8.9 - 10.3 mg/dL   Total Protein 6.4 (L) 6.5 - 8.1 g/dL   Albumin 1.9 (L) 3.5 - 5.0 g/dL   AST 23 15 - 41 U/L   ALT 16 14 - 54 U/L   Alkaline Phosphatase 72 38 - 126 U/L   Total Bilirubin 0.9 0.3 - 1.2 mg/dL   GFR calc non Af Amer 54 (L) >60 mL/min   GFR calc Af Amer >60 >60 mL/min    Comment: (NOTE) The eGFR has been calculated using the CKD EPI equation. This calculation has not been  validated in all clinical  situations. eGFR's persistently <60 mL/min signify possible Chronic Kidney Disease.    Anion gap 10 5 - 15  C difficile quick scan w PCR reflex     Status: None   Collection Time: 10/16/17  2:47 PM  Result Value Ref Range   C Diff antigen NEGATIVE NEGATIVE   C Diff toxin NEGATIVE NEGATIVE   C Diff interpretation No C. difficile detected.     Imaging / Studies: No results found.  Medications / Allergies: per chart  Antibiotics: Anti-infectives (From admission, onward)   Start     Dose/Rate Route Frequency Ordered Stop   10/17/17 0800  vancomycin (VANCOCIN) IVPB 750 mg/150 ml premix     750 mg 150 mL/hr over 60 Minutes Intravenous Every 24 hours 10/16/17 1220     10/16/17 1800  anidulafungin (ERAXIS) 100 mg in sodium chloride 0.9 % 100 mL IVPB     100 mg 78 mL/hr over 100 Minutes Intravenous Every 24 hours 10/15/17 1657     10/16/17 0600  vancomycin (VANCOCIN) IVPB 1000 mg/200 mL premix  Status:  Discontinued     1,000 mg 200 mL/hr over 60 Minutes Intravenous Every 36 hours 11/03/2017 2308 10/16/17 1220   10/15/17 1800  anidulafungin (ERAXIS) 200 mg in sodium chloride 0.9 % 200 mL IVPB     200 mg 78 mL/hr over 200 Minutes Intravenous  Once 10/15/17 1657 10/15/17 2148   11/01/2017 2330  piperacillin-tazobactam (ZOSYN) IVPB 3.375 g  Status:  Discontinued     3.375 g 100 mL/hr over 30 Minutes Intravenous Every 8 hours 10/29/2017 2257 10/27/2017 2259   10/10/2017 2315  piperacillin-tazobactam (ZOSYN) IVPB 3.375 g     3.375 g 12.5 mL/hr over 240 Minutes Intravenous Every 8 hours 10/09/2017 2300     10/10/2017 1930  vancomycin (VANCOCIN) IVPB 1000 mg/200 mL premix     1,000 mg 200 mL/hr over 60 Minutes Intravenous  Once 10/18/2017 1915 10/18/2017 2044   11/02/2017 1530  piperacillin-tazobactam (ZOSYN) IVPB 3.375 g     3.375 g 100 mL/hr over 30 Minutes Intravenous  Once 10/24/2017 1529 10/30/2017 1604        Note: Portions of this report may have been transcribed using voice recognition software.  Every effort was made to ensure accuracy; however, inadvertent computerized transcription errors may be present.   Any transcriptional errors that result from this process are unintentional.     Danielle [name], M.D., F.A.C.S. Gastrointestinal and Minimally Invasive Surgery [facility] [address] [place], [place] [address] [phone] Main / Paging   10/17/2017

## 2017-10-17 NOTE — Progress Notes (Signed)
0400 pt refused getting labs drawn. She stated she is getting a PICC today and they can draw the labs after the picc is placed. Said her arms were hurting where ivs are located, no signs nor symptoms of infiltration however gave her 2 heat packs to place on her arm. She stated it made the arm feel better. Will cont to monitor pt and iv locations

## 2017-10-17 NOTE — Progress Notes (Signed)
Peripherally Inserted Central Catheter/Midline Placement  The IV Nurse has discussed with the patient and/or persons authorized to consent for the patient, the purpose of this procedure and the potential benefits and risks involved with this procedure.  The benefits include less needle sticks, lab draws from the catheter, and the patient may be discharged home with the catheter. Risks include, but not limited to, infection, bleeding, blood clot (thrombus formation), and puncture of an artery; nerve damage and irregular heartbeat and possibility to perform a PICC exchange if needed/ordered by physician.  Alternatives to this procedure were also discussed.  Bard Power PICC patient education guide, fact sheet on infection prevention and patient information card has been provided to patient /or left at bedside.    PICC/Midline Placement Documentation  PICC Double Lumen 80/16/55 PICC Left Basilic 40 cm 0 cm (Active)  Indication for Insertion or Continuance of Line Administration of hyperosmolar/irritating solutions (i.e. TPN, Vancomycin, etc.) 10/17/2017  9:00 AM  Exposed Catheter (cm) 0 cm 10/17/2017  9:00 AM  Site Assessment Clean;Dry;Intact 10/17/2017  9:00 AM  Lumen #1 Status Flushed;Blood return noted;Saline locked 10/17/2017  9:00 AM  Lumen #2 Status Flushed;Blood return noted;Saline locked 10/17/2017  9:00 AM  Dressing Type Transparent;Securing device 10/17/2017  9:00 AM  Dressing Status Clean;Dry;Intact;Antimicrobial disc in place 10/17/2017  9:00 AM  Line Care Connections checked and tightened 10/17/2017  9:00 AM  Line Adjustment (NICU/IV Team Only) No 10/17/2017  9:00 AM  Dressing Intervention New dressing 10/17/2017  9:00 AM  Dressing Change Due 10/24/17 10/17/2017  9:00 AM       Raynelle Fanning Diane 10/17/2017, 9:05 AM

## 2017-10-18 ENCOUNTER — Inpatient Hospital Stay (HOSPITAL_COMMUNITY): Payer: Medicare HMO

## 2017-10-18 DIAGNOSIS — D72829 Elevated white blood cell count, unspecified: Secondary | ICD-10-CM

## 2017-10-18 LAB — GLUCOSE, CAPILLARY
GLUCOSE-CAPILLARY: 105 mg/dL — AB (ref 65–99)
GLUCOSE-CAPILLARY: 111 mg/dL — AB (ref 65–99)
Glucose-Capillary: 97 mg/dL (ref 65–99)
Glucose-Capillary: 91 mg/dL (ref 65–99)

## 2017-10-18 LAB — ALBUMIN, PLEURAL OR PERITONEAL FLUID: ALBUMIN FL: 1.4 g/dL

## 2017-10-18 LAB — TRIGLYCERIDES: Triglycerides: 444 mg/dL — ABNORMAL HIGH (ref <150)

## 2017-10-18 LAB — CBC
HCT: 24.4 % — ABNORMAL LOW (ref 36.0–46.0)
Hemoglobin: 8 g/dL — ABNORMAL LOW (ref 12.0–15.0)
MCH: 29.3 pg (ref 26.0–34.0)
MCHC: 32.8 g/dL (ref 30.0–36.0)
MCV: 89.4 fL (ref 78.0–100.0)
Platelets: 384 10*3/uL (ref 150–400)
RBC: 2.73 MIL/uL — ABNORMAL LOW (ref 3.87–5.11)
RDW: 18.2 % — AB (ref 11.5–15.5)
WBC: 24.1 10*3/uL — ABNORMAL HIGH (ref 4.0–10.5)

## 2017-10-18 LAB — COMPREHENSIVE METABOLIC PANEL
ALK PHOS: 60 U/L (ref 38–126)
ALT: 12 U/L — AB (ref 14–54)
AST: 20 U/L (ref 15–41)
Albumin: 1.6 g/dL — ABNORMAL LOW (ref 3.5–5.0)
Anion gap: 6 (ref 5–15)
BUN: 7 mg/dL (ref 6–20)
CO2: 16 mmol/L — ABNORMAL LOW (ref 22–32)
CREATININE: 0.97 mg/dL (ref 0.44–1.00)
Calcium: 7.4 mg/dL — ABNORMAL LOW (ref 8.9–10.3)
Chloride: 110 mmol/L (ref 101–111)
GFR, EST NON AFRICAN AMERICAN: 58 mL/min — AB (ref >60)
Glucose, Bld: 444 mg/dL — ABNORMAL HIGH (ref 65–99)
Potassium: 4 mmol/L (ref 3.5–5.1)
Sodium: 132 mmol/L — ABNORMAL LOW (ref 135–145)
Total Bilirubin: 0.3 mg/dL (ref 0.3–1.2)
Total Protein: 5.7 g/dL — ABNORMAL LOW (ref 6.5–8.1)

## 2017-10-18 LAB — LACTATE DEHYDROGENASE: LDH: 219 U/L — AB (ref 98–192)

## 2017-10-18 LAB — GLUCOSE, PLEURAL OR PERITONEAL FLUID: Glucose, Fluid: 113 mg/dL

## 2017-10-18 LAB — GRAM STAIN

## 2017-10-18 LAB — BODY FLUID CELL COUNT WITH DIFFERENTIAL
Lymphs, Fluid: 40 %
Monocyte-Macrophage-Serous Fluid: 46 % — ABNORMAL LOW (ref 50–90)
Neutrophil Count, Fluid: 14 % (ref 0–25)
Total Nucleated Cell Count, Fluid: 2106 cu mm — ABNORMAL HIGH (ref 0–1000)

## 2017-10-18 LAB — FOLATE: Folate: 10.1 ng/mL (ref >5.9)

## 2017-10-18 LAB — PROTEIN, PLEURAL OR PERITONEAL FLUID

## 2017-10-18 LAB — FERRITIN: Ferritin: 843 ng/mL — ABNORMAL HIGH (ref 11–307)

## 2017-10-18 LAB — PHOSPHORUS: PHOSPHORUS: 3.5 mg/dL (ref 2.5–4.6)

## 2017-10-18 LAB — LACTATE DEHYDROGENASE, PLEURAL OR PERITONEAL FLUID: LD FL: 187 U/L — AB (ref 3–23)

## 2017-10-18 LAB — MAGNESIUM: MAGNESIUM: 2 mg/dL (ref 1.7–2.4)

## 2017-10-18 LAB — DIFFERENTIAL
BASOS ABS: 0 10*3/uL (ref 0.0–0.1)
BASOS PCT: 0 %
EOS ABS: 0.7 10*3/uL (ref 0.0–0.7)
Eosinophils Relative: 3 %
Lymphocytes Relative: 18 %
Lymphs Abs: 4.4 10*3/uL — ABNORMAL HIGH (ref 0.7–4.0)
Monocytes Absolute: 3.3 10*3/uL — ABNORMAL HIGH (ref 0.1–1.0)
Monocytes Relative: 14 %
Neutro Abs: 15.7 10*3/uL — ABNORMAL HIGH (ref 1.7–7.7)
Neutrophils Relative %: 65 %

## 2017-10-18 LAB — VITAMIN B12: Vitamin B-12: 1171 pg/mL — ABNORMAL HIGH (ref 180–914)

## 2017-10-18 LAB — IRON AND TIBC: Iron: 6 ug/dL — ABNORMAL LOW (ref 28–170)

## 2017-10-18 LAB — PREALBUMIN

## 2017-10-18 MED ORDER — INSULIN ASPART 100 UNIT/ML ~~LOC~~ SOLN
0.0000 [IU] | Freq: Three times a day (TID) | SUBCUTANEOUS | Status: DC
  Administered 2017-10-19: 1 [IU] via SUBCUTANEOUS

## 2017-10-18 MED ORDER — LIDOCAINE HCL 1 % IJ SOLN
INTRAMUSCULAR | Status: AC
  Filled 2017-10-18: qty 20

## 2017-10-18 MED ORDER — DIPHENOXYLATE-ATROPINE 2.5-0.025 MG PO TABS
1.0000 | ORAL_TABLET | Freq: Four times a day (QID) | ORAL | Status: DC | PRN
  Administered 2017-10-18: 1 via ORAL
  Filled 2017-10-18: qty 1

## 2017-10-18 MED ORDER — IOPAMIDOL (ISOVUE-300) INJECTION 61%
INTRAVENOUS | Status: AC
  Filled 2017-10-18: qty 300

## 2017-10-18 MED ORDER — INSULIN ASPART 100 UNIT/ML ~~LOC~~ SOLN: 0.0000 [IU] | Freq: Every day | SUBCUTANEOUS | Status: DC

## 2017-10-18 MED ORDER — TRACE MINERALS CR-CU-MN-SE-ZN 10-1000-500-60 MCG/ML IV SOLN
INTRAVENOUS | Status: AC
  Administered 2017-10-18: 17:00:00 via INTRAVENOUS
  Filled 2017-10-18 (×2): qty 960

## 2017-10-18 NOTE — Progress Notes (Signed)
Patient refused blood draw for culture. Procedure rational explained to patient.

## 2017-10-18 NOTE — Procedures (Signed)
Ultrasound-guided diagnostic and therapeutic left thoracentesis performed yielding 780 cc  of slightly hazy, blood-tinged fluid. No immediate complications. Follow-up chest x-ray pending. The fluid was sent to the lab for preordered studies.

## 2017-10-18 NOTE — Progress Notes (Signed)
Subjective: No new complaints   Antibiotics:  Anti-infectives (From admission, onward)   Start     Dose/Rate Route Frequency Ordered Stop   10/17/17 0800  vancomycin (VANCOCIN) IVPB 750 mg/150 ml premix  Status:  Discontinued     750 mg 150 mL/hr over 60 Minutes Intravenous Every 24 hours 10/16/17 1220 10/17/17 1023   10/16/17 1800  anidulafungin (ERAXIS) 100 mg in sodium chloride 0.9 % 100 mL IVPB     100 mg 78 mL/hr over 100 Minutes Intravenous Every 24 hours 10/15/17 1657     10/16/17 0600  vancomycin (VANCOCIN) IVPB 1000 mg/200 mL premix  Status:  Discontinued     1,000 mg 200 mL/hr over 60 Minutes Intravenous Every 36 hours 10/29/2017 2308 10/16/17 1220   10/15/17 1800  anidulafungin (ERAXIS) 200 mg in sodium chloride 0.9 % 200 mL IVPB     200 mg 78 mL/hr over 200 Minutes Intravenous  Once 10/15/17 1657 10/15/17 2148   10/22/2017 2330  piperacillin-tazobactam (ZOSYN) IVPB 3.375 g  Status:  Discontinued     3.375 g 100 mL/hr over 30 Minutes Intravenous Every 8 hours 11/03/2017 2257 10/20/2017 2259   10/16/2017 2315  piperacillin-tazobactam (ZOSYN) IVPB 3.375 g     3.375 g 12.5 mL/hr over 240 Minutes Intravenous Every 8 hours 11/04/2017 2300     11/04/2017 1930  vancomycin (VANCOCIN) IVPB 1000 mg/200 mL premix     1,000 mg 200 mL/hr over 60 Minutes Intravenous  Once 10/23/2017 1915 10/13/2017 2044   10/08/2017 1530  piperacillin-tazobactam (ZOSYN) IVPB 3.375 g     3.375 g 100 mL/hr over 30 Minutes Intravenous  Once 11/03/2017 1529 10/11/2017 1604      Medications: Scheduled Meds: . Chlorhexidine Gluconate Cloth  6 each Topical Daily  . Chlorhexidine Gluconate Cloth  6 each Topical Daily  . insulin aspart  0-5 Units Subcutaneous QHS  . insulin aspart  0-9 Units Subcutaneous TID WC  . iopamidol      . lip balm  1 application Topical BID  . metoCLOPramide (REGLAN) injection  5 mg Intravenous Q8H  . psyllium  1 packet Oral Daily  . sodium bicarbonate  650 mg Oral TID  . sodium  chloride flush  10-40 mL Intracatheter Q12H   Continuous Infusions: . anidulafungin Stopped (10/17/17 2050)  . piperacillin-tazobactam (ZOSYN)  IV Stopped (10/18/17 0942)  .  sodium bicarbonate (isotonic) infusion in sterile water 50 mL/hr at 10/17/17 2100  . Marland KitchenTPN (CLINIMIX-E) Adult 30 mL/hr at 10/17/17 2100  . Marland KitchenTPN (CLINIMIX-E) Adult     PRN Meds:.[DISCONTINUED] acetaminophen **OR** acetaminophen, alum & mag hydroxide-simeth, bisacodyl, dicyclomine, diphenhydrAMINE, diphenoxylate-atropine, guaiFENesin-dextromethorphan, hydrocortisone, hydrocortisone cream, magic mouthwash, menthol-cetylpyridinium, morphine injection, nystatin, phenol, promethazine, sodium chloride flush    Objective: Weight change:   Intake/Output Summary (Last 24 hours) at 10/18/2017 1338 Last data filed at 10/18/2017 0542 Gross per 24 hour  Intake 420 ml  Output 1200 ml  Net -780 ml   Blood pressure (!) 137/53, pulse (!) 101, temperature 99.5 F (37.5 C), temperature source Oral, resp. rate (!) 35, height 5' 4.5" (1.638 m), weight 123 lb 3.8 oz (55.9 kg), SpO2 99 %. Temp:  [98.4 F (36.9 C)-100.9 F (38.3 C)] 99.5 F (37.5 C) (11/12 1130) Pulse Rate:  [83-124] 101 (11/12 1151) Resp:  [21-42] 35 (11/12 1151) BP: (90-137)/(37-63) 137/53 (11/12 1151) SpO2:  [97 %-100 %] 99 % (11/12 1151)  Physical Exam: General: Alert and awake, oriented x3, not in any acute  distress. HEENT: anicteric sclera, EOMI CVS tachy rate, normal r,  no murmur rubs or gallops Chest: clear to auscultation bilaterally, no wheezing, rales or rhonchi Abdomen: bandage in place epigastrium Extremities: no  clubbing or edema noted bilaterally Skin: no rashes Neuro: nonfocal  CBC:  CBC Latest Ref Rng & Units 10/18/2017 10/17/2017 10/16/2017  WBC 4.0 - 10.5 K/uL 24.1(H) 28.7(H) 29.9(H)  Hemoglobin 12.0 - 15.0 g/dL 8.0(L) 8.6(L) 9.2(L)  Hematocrit 36.0 - 46.0 % 24.4(L) 26.2(L) 28.1(L)  Platelets 150 - 400 K/uL 384 444(H) 370       BMET Recent Labs    10/17/17 1126 10/18/17 0542  NA 134* 132*  K 4.4 4.0  CL 114* 110  CO2 13* 16*  GLUCOSE 75 444*  BUN 5* 7  CREATININE 1.01* 0.97  CALCIUM 7.4* 7.4*     Liver Panel  Recent Labs    10/17/17 1126 10/18/17 0542  PROT 6.3* 5.7*  ALBUMIN 1.8* 1.6*  AST 23 20  ALT 14 12*  ALKPHOS 71 60  BILITOT 1.0 0.3       Sedimentation Rate No results for input(s): ESRSEDRATE in the last 72 hours. C-Reactive Protein No results for input(s): CRP in the last 72 hours.  Micro Results: Recent Results (from the past 720 hour(s))  Blood Culture (routine x 2)     Status: None   Collection Time: 10/01/17 11:27 AM  Result Value Ref Range Status   Specimen Description BLOOD RIGHT ARM  Final   Special Requests IN PEDIATRIC BOTTLE Blood Culture adequate volume  Final   Culture   Final    NO GROWTH 5 DAYS Performed at [facility], [address] [place], [place] [address]    Report Status 10/06/2017 FINAL  Final  Blood Culture (routine x 2)     Status: None   Collection Time: 10/01/17  6:55 PM  Result Value Ref Range Status   Specimen Description BLOOD LEFT HAND  Final   Special Requests IN PEDIATRIC BOTTLE Blood Culture adequate volume  Final   Culture   Final    NO GROWTH 5 DAYS Performed at [facility], [place] [address] [place], [place] [address]    Report Status 10/06/2017 FINAL  Final  MRSA PCR Screening     Status: None   Collection Time: 10/04/17 11:03 AM  Result Value Ref Range Status   MRSA by PCR NEGATIVE NEGATIVE Final    Comment:        The GeneXpert MRSA Assay (FDA approved for NASAL specimens only), is one component of a comprehensive MRSA colonization surveillance program. It is not intended to diagnose MRSA infection nor to guide or monitor treatment for MRSA infections.   Culture, blood (Routine x 2)     Status: None (Preliminary result)   Collection Time: 10/27/2017  2:05 PM  Result Value Ref Range Status    Specimen Description BLOOD LEFT FOREARM  Final   Special Requests   Final    BOTTLES DRAWN AEROBIC AND ANAEROBIC Blood Culture results may not be optimal due to an inadequate volume of blood received in culture bottles   Culture   Final    NO GROWTH 3 DAYS Performed at [facility], [place] [address], [place], [place] [address]    Report Status PENDING  Incomplete  MRSA PCR Screening     Status: None   Collection Time: 10/29/2017 10:45 PM  Result Value Ref Range Status   MRSA by PCR NEGATIVE NEGATIVE  Final    Comment:        The GeneXpert MRSA Assay (FDA approved for NASAL specimens only), is one component of a comprehensive MRSA colonization surveillance program. It is not intended to diagnose MRSA infection nor to guide or monitor treatment for MRSA infections.   Culture, blood (Routine x 2)     Status: None (Preliminary result)   Collection Time: 11/04/2017 11:28 PM  Result Value Ref Range Status   Specimen Description BLOOD LEFT HAND  Final   Special Requests IN PEDIATRIC BOTTLE Blood Culture adequate volume  Final   Culture   Final    NO GROWTH 2 DAYS Performed at [facility], [place] [address] [place], [place] [address]    Report Status PENDING  Incomplete  C difficile quick scan w PCR reflex     Status: None   Collection Time: 10/16/17  2:47 PM  Result Value Ref Range Status   C Diff antigen NEGATIVE NEGATIVE Final   C Diff toxin NEGATIVE NEGATIVE Final   C Diff interpretation No C. difficile detected.  Final  Gastrointestinal Panel by PCR , Stool     Status: None   Collection Time: 10/16/17  2:48 PM  Result Value Ref Range Status   Campylobacter species NOT DETECTED NOT DETECTED Final   Plesimonas shigelloides NOT DETECTED NOT DETECTED Final   Salmonella species NOT DETECTED NOT DETECTED Final   Yersinia enterocolitica NOT DETECTED NOT DETECTED Final   Vibrio species NOT DETECTED NOT DETECTED Final   Vibrio cholerae NOT DETECTED NOT DETECTED Final    Enteroaggregative E coli (EAEC) NOT DETECTED NOT DETECTED Final   Enteropathogenic E coli (EPEC) NOT DETECTED NOT DETECTED Final   Enterotoxigenic E coli (ETEC) NOT DETECTED NOT DETECTED Final   Shiga like toxin producing E coli (STEC) NOT DETECTED NOT DETECTED Final   Shigella/Enteroinvasive E coli (EIEC) NOT DETECTED NOT DETECTED Final   Cryptosporidium NOT DETECTED NOT DETECTED Final   Cyclospora cayetanensis NOT DETECTED NOT DETECTED Final   Entamoeba histolytica NOT DETECTED NOT DETECTED Final   Giardia lamblia NOT DETECTED NOT DETECTED Final   Adenovirus F40/41 NOT DETECTED NOT DETECTED Final   Astrovirus NOT DETECTED NOT DETECTED Final   Norovirus GI/GII NOT DETECTED NOT DETECTED Final   Rotavirus A NOT DETECTED NOT DETECTED Final   Sapovirus (I, II, IV, and V) NOT DETECTED NOT DETECTED Final    Studies/Results: Dg Esophagus W/water Sol Cm  Result Date: 10/18/2017 CLINICAL DATA:  Nausea and vomiting.  Recent gastric surgery. EXAM: ESOPHOGRAM/BARIUM SWALLOW TECHNIQUE: Single contrast examination was performed using water soluble contrast. The study was performed in the LPO semi-upright position. FLUOROSCOPY TIME:  Fluoroscopy Time:  1 minutes 18 seconds Radiation Exposure Index (if provided by the fluoroscopic device): 15.3 mGy COMPARISON:  CT scans dated 10/18/2017 and 10/05/2017 FINDINGS: The oropharyngeal swallowing mechanisms appear normal. There is a 2.5 cm area of circumferential narrowing of the distal esophagus just proximal to the gastric fundus. This is in the region of the mediastinal fluid collections seen on the prior CT scan. Water-soluble contrast flows freely through this area. Ms. Husser tolerated the procedure well. IMPRESSION: 2.5 cm circumferential narrowing of the distal esophagus just above the gastric fundus. I suspect this is is due to a compression of the esophagus by the mediastinal fluid demonstrated on the prior CT scan. No obstruction. The patient was quite  reluctant to drink the contrast. Electronically Signed   By: Lorriane Shire M.D.   On:  10/18/2017 09:31      Assessment/Plan:  INTERVAL HISTORY:  Pt to go for IR guided aspirate   Principal Problem:   Septic shock ([place]) Active Problems:   Leukocytosis   Perianal Crohn's disease, with fistula ([place])   Acute renal failure superimposed on stage 3 chronic kidney disease ([place])   Incarcerated hiatal hernia s/p reduction 09/08/2017   Immunosuppressed status ([facility])   Hypotension   Nausea and vomiting   Anasarca   DVT (deep venous thrombosis) ([facility])   Protein-calorie malnutrition, moderate ([facility])   Mediastinal post-op seromas   Pleural effusion on left   Patient's noncompliance with intervention / medical advice     Danielle [name] is a 71 y.o. 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  She is on undergone exploratory laparotomy earlier in October which had shown purulent peritonitis and leaking around her gastrostomy tube.  Her pelvic abscesses have been drained and grew Candida glabrata and Candida albicans.  --Agree with IR guided drainage of this abscess and sending material for culture --Issue going to need to go back to the operating room? To use Zosyn and echinocandin for now.   LOS: 4 days   Alcide Evener 10/18/2017, 1:38 PM

## 2017-10-18 NOTE — Care Management Note (Signed)
Case Management Note  Patient Details  Name: Danielle [name] MRN: [id number] Date of Birth: Jul 21, 1946  Subjective/Objective:                  Sepsis, hypotension  Action/Plan: Date: October 18, 2017 Velva Harman, BSN, [place], [place] Chart and notes review for patient progress and needs. Will follow for case management and discharge needs. Next review date: [id number]  Expected Discharge Date:  (unknown)               Expected Discharge Plan:     In-House Referral:     Discharge planning Services  CM Consult  Post Acute Care Choice:  Resumption of Svcs/PTA Provider Choice offered to:     DME Arranged:    DME Agency:     HH Arranged:    [place] Agency:     Status of Service:  In process, will continue to follow  If discussed at Long Length of Stay Meetings, dates discussed:    Additional Comments:  Leeroy Cha, RN 10/18/2017, 8:37 AM

## 2017-10-18 NOTE — Progress Notes (Signed)
PT Cancellation Note  Patient Details Name: Danielle [name] MRN: [id number] DOB: 1946-06-25   Cancelled Treatment:    Reason Eval/Treat Not Completed: Patient at procedure or test/unavailable. Will check back another time.   [name] 10/18/2017, 9:11 AM  [name] PT [phone]

## 2017-10-18 NOTE — Progress Notes (Signed)
XR done- results as followed- PICC noted ending overlying the proximal SVC - TPN resumed.

## 2017-10-18 NOTE — Progress Notes (Signed)
PROGRESS NOTE    Danielle [name]  JQB:[id number] DOB: 22-Jan-1946 DOA: 10/19/2017 PCP: Iona Beard, MD    Brief Narrative:  Danielle [name] is Danielle [name] 71 y.o. female with medical history significant of Crohn's disease, hiatal hernia, left buttock abscess, psoriasis, small bowel obstruction who has been recently admitted in the discharge twice in the past 5 weeks, first from 09/08/2017 to 09/26/2017 after undergoing type IV hiatal hernia repair.  Postoperatively the patient required endotracheal intubation, mechanical ventilation and the use of pressors.  She was extubated on postop day #2.  She improved clinically and was on parenteral nutrition, however she developed Ante Arredondo pelvic fluid collection which require Danielle [name] drain placement.  On 09/11/2017 the patient blood cultures were positive for Candida glabrata and abscess culture were positive for C. Albicans and C. Glabrata. She was given treatment with parenteral antifungals. She subsequently improved was able to ambulate with physical therapy, weaned off of parenteral nutrition and was tolerating oral diet. ID recommended to continue Invanz and Erasis for 2 weeks. Danielle [name] PICC line was placed in her right upper arm and she was subsequently discharged home.  She returned on 10/01/2017 due to intermittent fevers.  She was placed on the neck slowly and cultures were obtained.  Antibiotics were discontinue 24 hours prior to discharge and the patient remained afebrile and clinically improved.  However, she returns today with complaints of persistent nausea associated with about 3 episodes of emesis daily.  She has also had loose stools, but stated that this is her baseline since she suffers from Crohn's disease. She denies fever, chills, rhinorrhea, sore throat, productive cough, dyspnea, chest pain, palpitations, dizziness, diaphoresis, PND, orthopnea or pitting edema of the lower extremities. She denies constipation, melena or hematochezia.  She denies dysuria, frequency or  hematuria.  Denies polyuria, polydipsia or blurred vision.  Assessment & Plan:   Principal Problem:   Septic shock ([place]) Active Problems:   Leukocytosis   Perianal Crohn's disease, with fistula ([place])   Acute renal failure superimposed on stage 3 chronic kidney disease ([place])   Incarcerated hiatal hernia s/p reduction 09/08/2017   Immunosuppressed status (HCC)   Hypotension   Nausea and vomiting   Anasarca   DVT (deep venous thrombosis) (HCC)   Protein-calorie malnutrition, moderate (HCC)   Mediastinal post-op seromas   Pleural effusion on left   Patient's noncompliance with intervention / medical advice    Sepsis 2/2 Mediastinal Abscess:  CT with "reaccumulation of fluid collection in lower mediastinum" Also possible small abscess along posterior margin of spleen. Thickening of herniated portion of stomach and esopagus, likely reactive [ ]  consider CT chest Continue zosyn/eraxis, vancomycin has been d/c'd [ ]  Bcx NGTD x 48 hrs IVF IR consult, planning on attempting thoracentesis today (did not feel candidate for perc drain - recommended discussing with GI for potential endoscopic drainage who also did not feel candidate, but if thought necessary consider at tertiary care center).     [ ]  follow up thoracentesis labs [ ]  follow up thoracentesis cultures CT surgery if requested by surgery ID c/s, appreciate recs  Decreased PO intake  Nausea Vomiting:  Reports no emesis yesterday.   TPA has been started [ ]  esophagram to rule out obstruction  Pyuria:  Urine cx (never added on), but low suspicion for sx UTI Zosyn as above  Anemia: H/H fell to 6.8 11/9 afternoon.  Transfused 1 unit pRBC.   [ ]  hemoccult stool negative [ ]  iron, ferritin, b12, folate  [ ]   daily H/H  Hypotension  Tachycardia:  2/2 above.  Improved.  Has somewhat of soft BP's at baseline and MAPs drop when sleeping. BP improved overall Goal MAPs >65 Continue IVF  Leukocytosis 2/2 above  Diarrhea:  C  diff negative.  GI pathogen panel pending Lomotil Rectal tube in place.    Hypokalemia  Hypomagnesemia: Replete prn  NAGMA:  2/2 NS administration, looks to be chronic as well on sodium bicarb. Receiving sodium bicarb gtt Pt unable to take PO bicarb at this time and has been refusing  HTN: holding home spironolactone  RUE DVT: holding eliquis in preparation for possible procedure.   Will need to resume when able.  History of crohns   PICC this AM  DVT prophylaxis: SCD Code Status: full Family Communication: none Disposition Plan: pending improvement   Consultants:   Surgery, IR, ID  Procedures: (Don't include imaging studies which can be auto populated. Include things that cannot be auto populated i.e. Echo, Carotid and venous dopplers, Foley, Bipap, HD, tubes/drains, wound vac, central lines etc)  PICC 11/11  Antimicrobials: (specify start and planned stop date. Auto populated tables are space occupying and do not give end dates) Anti-infectives (From admission, onward)   Start     Dose/Rate Route Frequency Ordered Stop   10/17/17 0800  vancomycin (VANCOCIN) IVPB 750 mg/150 ml premix  Status:  Discontinued     750 mg 150 mL/hr over 60 Minutes Intravenous Every 24 hours 10/16/17 1220 10/17/17 1023   10/16/17 1800  anidulafungin (ERAXIS) 100 mg in sodium chloride 0.9 % 100 mL IVPB     100 mg 78 mL/hr over 100 Minutes Intravenous Every 24 hours 10/15/17 1657     10/16/17 0600  vancomycin (VANCOCIN) IVPB 1000 mg/200 mL premix  Status:  Discontinued     1,000 mg 200 mL/hr over 60 Minutes Intravenous Every 36 hours 11/02/2017 2308 10/16/17 1220   10/15/17 1800  anidulafungin (ERAXIS) 200 mg in sodium chloride 0.9 % 200 mL IVPB     200 mg 78 mL/hr over 200 Minutes Intravenous  Once 10/15/17 1657 10/15/17 2148   10/29/2017 2330  piperacillin-tazobactam (ZOSYN) IVPB 3.375 g  Status:  Discontinued     3.375 g 100 mL/hr over 30 Minutes Intravenous Every 8 hours 10/29/2017 2257  10/16/2017 2259   11/01/2017 2315  piperacillin-tazobactam (ZOSYN) IVPB 3.375 g     3.375 g 12.5 mL/hr over 240 Minutes Intravenous Every 8 hours 11/02/2017 2300     10/08/2017 1930  vancomycin (VANCOCIN) IVPB 1000 mg/200 mL premix     1,000 mg 200 mL/hr over 60 Minutes Intravenous  Once 10/13/2017 1915 10/09/2017 2044   10/17/2017 1530  piperacillin-tazobactam (ZOSYN) IVPB 3.375 g     3.375 g 100 mL/hr over 30 Minutes Intravenous  Once 10/08/2017 1529 10/10/2017 1604         Subjective: Yesterday no emesis.  Did have nausea. Denies pain.  Feeling ok overall.  Wondering when procedure will be.   Objective: Vitals:   10/18/17 0300 10/18/17 0400 10/18/17 0500 10/18/17 0600  BP: (!) 90/38 (!) 118/48 (!) 91/41 (!) 120/40  Pulse: 98 99 95 95  Resp: (!) 35 (!) 31 (!) 35 (!) 21  Temp:      TempSrc:      SpO2: 99% 100% 100% 97%  Weight:      Height:        Intake/Output Summary (Last 24 hours) at 10/18/2017 0734 Last data filed at 10/18/2017 0542 Gross per 24 hour  Intake 420 ml  Output 1200 ml  Net -780 ml   Filed Weights   10/25/2017 1248 10/15/17 1449  Weight: 59 kg (130 lb) 55.9 kg (123 lb 3.8 oz)   General: No acute distress. Cardiovascular: Heart sounds show Dequandre Cordova regular rate, and rhythm. No gallops or rubs. No murmurs. No JVD. Lungs: Clear to auscultation bilaterally with good air movement. No rales, rhonchi or wheezes. Abdomen: Soft, nontender, nondistended with normal active bowel sounds. Midline dressing, healing wound.   Neurological: Alert and oriented 3. Moves all extremities 4 with equal strength. Cranial nerves II through XII grossly intact. Skin: Warm and dry. No rashes or lesions. Extremities: No clubbing or cyanosis. No edema.  Psychiatric: Mood and affect are normal. Insight and judgment are appropriate.  Data Reviewed: I have personally reviewed following labs and imaging studies  CBC: Recent Labs  Lab 10/17/2017 1405 10/15/17 0400 10/15/17 1651 10/16/17 1346  10/17/17 1126  WBC 23.0* 22.7*  --  29.9* 28.7*  NEUTROABS 14.3* 17.0*  --   --  18.6*  HGB 12.3 8.2* 6.8* 9.2* 8.6*  HCT 37.5 25.3* 20.8* 28.1* 26.2*  MCV 85.2 86.6  --  86.2 86.2  PLT 276 359  --  370 235*   Basic Metabolic Panel: Recent Labs  Lab 11/04/2017 1405 10/15/17 0400 10/16/17 1346 10/17/17 1126 10/18/17 0542  NA 133* 136 135 134* 132*  K 3.5 3.1* 3.6 4.4 4.0  CL 103 112* 115* 114* 110  CO2 18* 14* 10* 13* 16*  GLUCOSE 84 95 75 75 444*  BUN 17 13 6  5* 7  CREATININE 1.29* 1.06* 1.03* 1.01* 0.97  CALCIUM 8.3* 7.4* 7.6* 7.4* 7.4*  MG  --  1.3* 1.4* 2.0 2.0  PHOS  --  2.1* 2.1* 2.5 3.5   GFR: Estimated Creatinine Clearance: 48.3 mL/min (by C-G formula based on SCr of 0.97 mg/dL). Liver Function Tests: Recent Labs  Lab 10/30/2017 1405 10/15/17 0400 10/16/17 1346 10/17/17 1126 10/18/17 0542  AST 57* 37 23 23 20   ALT 34 25 16 14  12*  ALKPHOS 95 81 72 71 60  BILITOT 1.1 0.6 0.9 1.0 0.3  PROT 8.3* 7.4 6.4* 6.3* 5.7*  ALBUMIN 2.3* 2.1* 1.9* 1.8* 1.6*   Recent Labs  Lab 11/03/2017 1405  LIPASE 34   No results for input(s): AMMONIA in the last 168 hours. Coagulation Profile: Recent Labs  Lab 11/01/2017 1405 10/15/17 0400  INR 1.69 1.52   Cardiac Enzymes: No results for input(s): CKTOTAL, CKMB, CKMBINDEX, TROPONINI in the last 168 hours. BNP (last 3 results) No results for input(s): PROBNP in the last 8760 hours. HbA1C: No results for input(s): HGBA1C in the last 72 hours. CBG: Recent Labs  Lab 10/15/17 1800 10/15/17 2314 10/16/17 0606 10/16/17 1125  GLUCAP 104* 90 77 74   Lipid Profile: No results for input(s): CHOL, HDL, LDLCALC, TRIG, CHOLHDL, LDLDIRECT in the last 72 hours. Thyroid Function Tests: No results for input(s): TSH, T4TOTAL, FREET4, T3FREE, THYROIDAB in the last 72 hours. Anemia Panel: No results for input(s): VITAMINB12, FOLATE, FERRITIN, TIBC, IRON, RETICCTPCT in the last 72 hours. Sepsis Labs: Recent Labs  Lab 10/07/2017 1420    LATICACIDVEN 1.78    Recent Results (from the past 240 hour(s))  Culture, blood (Routine x 2)     Status: None (Preliminary result)   Collection Time: 11/03/2017  2:05 PM  Result Value Ref Range Status   Specimen Description BLOOD LEFT FOREARM  Final   Special Requests   Final  BOTTLES DRAWN AEROBIC AND ANAEROBIC Blood Culture results may not be optimal due to an inadequate volume of blood received in culture bottles   Culture   Final    NO GROWTH 3 DAYS Performed at [facility], [place] [address] [place], [place] [address]    Report Status PENDING  Incomplete  MRSA PCR Screening     Status: None   Collection Time: 10/08/2017 10:45 PM  Result Value Ref Range Status   MRSA by PCR NEGATIVE NEGATIVE Final    Comment:        The GeneXpert MRSA Assay (FDA approved for NASAL specimens only), is one component of Leeann Bady comprehensive MRSA colonization surveillance program. It is not intended to diagnose MRSA infection nor to guide or monitor treatment for MRSA infections.   Culture, blood (Routine x 2)     Status: None (Preliminary result)   Collection Time: 10/30/2017 11:28 PM  Result Value Ref Range Status   Specimen Description BLOOD LEFT HAND  Final   Special Requests IN PEDIATRIC BOTTLE Blood Culture adequate volume  Final   Culture   Final    NO GROWTH 2 DAYS Performed at [facility], [place] [address] [place], [place] [address]    Report Status PENDING  Incomplete  C difficile quick scan w PCR reflex     Status: None   Collection Time: 10/16/17  2:47 PM  Result Value Ref Range Status   C Diff antigen NEGATIVE NEGATIVE Final   C Diff toxin NEGATIVE NEGATIVE Final   C Diff interpretation No C. difficile detected.  Final  Gastrointestinal Panel by PCR , Stool     Status: None   Collection Time: 10/16/17  2:48 PM  Result Value Ref Range Status   Campylobacter species NOT DETECTED NOT DETECTED Final   Plesimonas shigelloides NOT DETECTED NOT DETECTED Final   Salmonella  species NOT DETECTED NOT DETECTED Final   Yersinia enterocolitica NOT DETECTED NOT DETECTED Final   Vibrio species NOT DETECTED NOT DETECTED Final   Vibrio cholerae NOT DETECTED NOT DETECTED Final   Enteroaggregative E coli (EAEC) NOT DETECTED NOT DETECTED Final   Enteropathogenic E coli (EPEC) NOT DETECTED NOT DETECTED Final   Enterotoxigenic E coli (ETEC) NOT DETECTED NOT DETECTED Final   Shiga like toxin producing E coli (STEC) NOT DETECTED NOT DETECTED Final   Shigella/Enteroinvasive E coli (EIEC) NOT DETECTED NOT DETECTED Final   Cryptosporidium NOT DETECTED NOT DETECTED Final   Cyclospora cayetanensis NOT DETECTED NOT DETECTED Final   Entamoeba histolytica NOT DETECTED NOT DETECTED Final   Giardia lamblia NOT DETECTED NOT DETECTED Final   Adenovirus F40/41 NOT DETECTED NOT DETECTED Final   Astrovirus NOT DETECTED NOT DETECTED Final   Norovirus GI/GII NOT DETECTED NOT DETECTED Final   Rotavirus Sergio Hobart NOT DETECTED NOT DETECTED Final   Sapovirus (I, II, IV, and V) NOT DETECTED NOT DETECTED Final         Radiology Studies: No results found.      Scheduled Meds: . Chlorhexidine Gluconate Cloth  6 each Topical Daily  . Chlorhexidine Gluconate Cloth  6 each Topical Daily  . insulin aspart  0-5 Units Subcutaneous QHS  . insulin aspart  0-9 Units Subcutaneous TID WC  . lip balm  1 application Topical BID  . metoCLOPramide (REGLAN) injection  5 mg Intravenous Q8H  . psyllium  1 packet Oral Daily  . sodium bicarbonate  650 mg Oral TID  . sodium chloride flush  10-40 mL  Intracatheter Q12H   Continuous Infusions: . anidulafungin Stopped (10/17/17 2050)  . piperacillin-tazobactam (ZOSYN)  IV 3.375 g (10/18/17 0542)  .  sodium bicarbonate (isotonic) infusion in sterile water 50 mL/hr at 10/17/17 2100  . Marland KitchenTPN (CLINIMIX-E) Adult 30 mL/hr at 10/17/17 2100     LOS: 4 days    Time spent: over 20 minutes    Fayrene Helper, MD [facility] Pager [phone]  If  7PM-7AM, please contact night-coverage [email or web address] Password [facility] 10/18/2017, 7:34 AM

## 2017-10-18 NOTE — Progress Notes (Signed)
[place] NOTE   Pharmacy Consult for TPN Indication: poor po intake, unable to place repeat Gtube  Patient Measurements: Height: 5' 4.5" (163.8 cm) Weight: 123 lb 3.8 oz (55.9 kg) IBW/kg (Calculated) : 55.85 TPN AdjBW (KG): 59 Body mass index is 20.83 kg/m.  Central access: helding PICC placement to r/o no bacteremia, PICC placed 11/11 TPN start date: 11/11  ASSESSMENT                                                                                                          HPI: 52 yoF re-admit 11/8 with N/V, loose stools (baseline w/Crohn's). Recurrent mediastinal fluid abscess, possible splenic abscess on CT PMH: Crohns, HH, hx SBO Significant events: 10/3 - 10/21 admit for [facility] repair, ICU post-op on vent & pressors, extubated POD2, was on TPN, pelvic fluid collection-C albicans & C glabrata tx with Eraxis & Ertapenem x 2 wk total 10/26 admit with fevers, tx with Zosyn  Significant events:  11/9: repletion of electrolytes Magnesium 2gm bolus, Phosphorus, KCl, refusing Bicarb tablets 11/10: Boost tid, electrolyte repletion, Mag 4gm bolus, K Phos 20 mMol bolus, KCL runs x2 11/11: PICC placed, no am labs to aid in electrolyte repletion  Today, 10/18/17  Glucose - blood glucose 444 - SSI started, no hx DM  Electrolytes - WNL except Na slightly low at 132 - continue to monitor. CorrCa 9.3  Renal - SCr 0.97 stable  LFTs - AST/ALT stable WNL  TGs - 444 (11/12)  Prealbumin - pending  Insulin Requirements: no hx DM, SSI to start 11/12  Current Nutrition: NPO  IVF: none   NUTRITIONAL GOALS                                                                                             RD recs: 60-70 gm protein, 1475-1170 kCal per 24 hr  Clinimix 5/15 at a goal rate of 55 ml/hr + 20% fat emulsion at 62ml/hr to provide: 66 g/day protein, 1465 Kcal/day.  PLAN  At 1800:  Continue Clinimix E 5/15 but will increase rate slowly from 30 ml/hr to 40 ml/hr  Note patient is ICU status and will not be started on lipids per TPN for at least 7 days and of note TGs are 444 per 11/12 labs  Plan to advance as tolerated to the goal rate.  TPN to contain standard multivitamins and trace elements.  SSI started today - monitor CBGs  TPN lab panels on Mondays & Thursdays.  F/u daily.   Adrian Saran, PharmD, BCPS Pager [phone] 10/18/2017 8:26 AM

## 2017-10-18 NOTE — Progress Notes (Signed)
   Subjective/Chief Complaint: Feels " more energy." Tol some thin liq   Objective: Vital signs in last 24 hours: Temp:  [98.1 F (36.7 C)-100.9 F (38.3 C)] 99.3 F (37.4 C) (11/12 0800) Pulse Rate:  [83-124] 95 (11/12 0600) Resp:  [21-42] 21 (11/12 0600) BP: (90-128)/(37-63) 120/40 (11/12 0600) SpO2:  [97 %-100 %] 97 % (11/12 0600) Last BM Date: 10/17/17  Intake/Output from previous day: 11/11 0701 - 11/12 0700 In: 420 [I.V.:220; IV Piggyback:200] Out: 1200 [Urine:1200] Intake/Output this shift: No intake/output data recorded.  General appearance: alert and cooperative GI: soft, non-tender; bowel sounds normal; no masses,  no organomegaly  Lab Results:  Recent Labs    10/16/17 1346 10/17/17 1126  WBC 29.9* 28.7*  HGB 9.2* 8.6*  HCT 28.1* 26.2*  PLT 370 444*   BMET Recent Labs    10/17/17 1126 10/18/17 0542  NA 134* 132*  K 4.4 4.0  CL 114* 110  CO2 13* 16*  GLUCOSE 75 444*  BUN 5* 7  CREATININE 1.01* 0.97  CALCIUM 7.4* 7.4*   PT/INR No results for input(s): LABPROT, INR in the last 72 hours. ABG No results for input(s): PHART, HCO3 in the last 72 hours.  Invalid input(s): PCO2, PO2  Studies/Results: No results found.  Anti-infectives: Anti-infectives (From admission, onward)   Start     Dose/Rate Route Frequency Ordered Stop   10/17/17 0800  vancomycin (VANCOCIN) IVPB 750 mg/150 ml premix  Status:  Discontinued     750 mg 150 mL/hr over 60 Minutes Intravenous Every 24 hours 10/16/17 1220 10/17/17 1023   10/16/17 1800  anidulafungin (ERAXIS) 100 mg in sodium chloride 0.9 % 100 mL IVPB     100 mg 78 mL/hr over 100 Minutes Intravenous Every 24 hours 10/15/17 1657     10/16/17 0600  vancomycin (VANCOCIN) IVPB 1000 mg/200 mL premix  Status:  Discontinued     1,000 mg 200 mL/hr over 60 Minutes Intravenous Every 36 hours 10/13/2017 2308 10/16/17 1220   10/15/17 1800  anidulafungin (ERAXIS) 200 mg in sodium chloride 0.9 % 200 mL IVPB     200 mg 78  mL/hr over 200 Minutes Intravenous  Once 10/15/17 1657 10/15/17 2148   11/04/2017 2330  piperacillin-tazobactam (ZOSYN) IVPB 3.375 g  Status:  Discontinued     3.375 g 100 mL/hr over 30 Minutes Intravenous Every 8 hours 10/31/2017 2257 11/02/2017 2259   10/18/2017 2315  piperacillin-tazobactam (ZOSYN) IVPB 3.375 g     3.375 g 12.5 mL/hr over 240 Minutes Intravenous Every 8 hours 10/23/2017 2300     11/03/2017 1930  vancomycin (VANCOCIN) IVPB 1000 mg/200 mL premix     1,000 mg 200 mL/hr over 60 Minutes Intravenous  Once 11/03/2017 1915 10/13/2017 2044   10/11/2017 1530  piperacillin-tazobactam (ZOSYN) IVPB 3.375 g     3.375 g 100 mL/hr over 30 Minutes Intravenous  Once 10/18/2017 1529 10/13/2017 1604      Assessment/Plan: -Con't TNA.  Pt will likely require TNA at home when [facility].  She's had this before from a Crohn's perspective and is aware of how that process goes. -For [place] throra today.  She does state that she does want it done. -Will consult PT to help with mobilization -Abx pe rID   LOS: 4 days    [name]., [facility] 10/18/2017

## 2017-10-19 DIAGNOSIS — Z9889 Other specified postprocedural states: Secondary | ICD-10-CM

## 2017-10-19 LAB — PHOSPHORUS: PHOSPHORUS: 2.3 mg/dL — AB (ref 2.5–4.6)

## 2017-10-19 LAB — CBC
HCT: 22.9 % — ABNORMAL LOW (ref 36.0–46.0)
HEMOGLOBIN: 7.6 g/dL — AB (ref 12.0–15.0)
MCH: 28.7 pg (ref 26.0–34.0)
MCHC: 33.2 g/dL (ref 30.0–36.0)
MCV: 86.4 fL (ref 78.0–100.0)
PLATELETS: 360 10*3/uL (ref 150–400)
RBC: 2.65 MIL/uL — AB (ref 3.87–5.11)
RDW: 17.3 % — ABNORMAL HIGH (ref 11.5–15.5)
WBC: 24.3 10*3/uL — AB (ref 4.0–10.5)

## 2017-10-19 LAB — HEMOGLOBIN AND HEMATOCRIT, BLOOD
HCT: 24.4 % — ABNORMAL LOW (ref 36.0–46.0)
HEMOGLOBIN: 7.9 g/dL — AB (ref 12.0–15.0)

## 2017-10-19 LAB — GLUCOSE, CAPILLARY
GLUCOSE-CAPILLARY: 108 mg/dL — AB (ref 65–99)
GLUCOSE-CAPILLARY: 148 mg/dL — AB (ref 65–99)
GLUCOSE-CAPILLARY: 91 mg/dL (ref 65–99)
Glucose-Capillary: 114 mg/dL — ABNORMAL HIGH (ref 65–99)

## 2017-10-19 LAB — CULTURE, BLOOD (ROUTINE X 2): Culture: NO GROWTH

## 2017-10-19 LAB — COMPREHENSIVE METABOLIC PANEL
ALK PHOS: 77 U/L (ref 38–126)
ALT: 20 U/L (ref 14–54)
AST: 45 U/L — ABNORMAL HIGH (ref 15–41)
Albumin: 1.6 g/dL — ABNORMAL LOW (ref 3.5–5.0)
Anion gap: 5 (ref 5–15)
BUN: 8 mg/dL (ref 6–20)
CALCIUM: 7.2 mg/dL — AB (ref 8.9–10.3)
CO2: 19 mmol/L — AB (ref 22–32)
CREATININE: 0.94 mg/dL (ref 0.44–1.00)
Chloride: 112 mmol/L — ABNORMAL HIGH (ref 101–111)
Glucose, Bld: 119 mg/dL — ABNORMAL HIGH (ref 65–99)
Potassium: 2.9 mmol/L — ABNORMAL LOW (ref 3.5–5.1)
SODIUM: 136 mmol/L (ref 135–145)
Total Bilirubin: 0.8 mg/dL (ref 0.3–1.2)
Total Protein: 5.7 g/dL — ABNORMAL LOW (ref 6.5–8.1)

## 2017-10-19 LAB — BRAIN NATRIURETIC PEPTIDE: B NATRIURETIC PEPTIDE 5: 215.3 pg/mL — AB (ref 0.0–100.0)

## 2017-10-19 LAB — AMYLASE, PLEURAL OR PERITONEAL FLUID: AMYLASE FL: 90 U/L

## 2017-10-19 LAB — MAGNESIUM: MAGNESIUM: 1.4 mg/dL — AB (ref 1.7–2.4)

## 2017-10-19 LAB — PH, BODY FLUID: pH, Body Fluid: 7.6

## 2017-10-19 LAB — TRIGLYCERIDES: Triglycerides: 104 mg/dL (ref <150)

## 2017-10-19 MED ORDER — POTASSIUM CHLORIDE 10 MEQ/100ML IV SOLN: 10.0000 meq | INTRAVENOUS | Status: DC

## 2017-10-19 MED ORDER — POTASSIUM CHLORIDE 10 MEQ/100ML IV SOLN
10.0000 meq | INTRAVENOUS | Status: AC
  Administered 2017-10-19 (×3): 10 meq via INTRAVENOUS
  Filled 2017-10-19 (×3): qty 100

## 2017-10-19 MED ORDER — ACETAMINOPHEN 325 MG PO TABS
650.0000 mg | ORAL_TABLET | Freq: Four times a day (QID) | ORAL | Status: DC | PRN
  Administered 2017-10-19 – 2017-10-20 (×2): 650 mg via ORAL
  Filled 2017-10-19 (×2): qty 2

## 2017-10-19 MED ORDER — POTASSIUM CHLORIDE 20 MEQ PO PACK: 40.0000 meq | PACK | Freq: Once | ORAL | Status: DC

## 2017-10-19 MED ORDER — HEPARIN (PORCINE) IN NACL 100-0.45 UNIT/ML-% IJ SOLN
1400.0000 [IU]/h | INTRAMUSCULAR | Status: DC
  Administered 2017-10-19: 850 [IU]/h via INTRAVENOUS
  Administered 2017-10-20: 1400 [IU]/h via INTRAVENOUS
  Filled 2017-10-19 (×2): qty 250

## 2017-10-19 MED ORDER — DEXTROSE 5 % IV SOLN
6.0000 g | Freq: Once | INTRAVENOUS | Status: AC
  Administered 2017-10-19: 6 g via INTRAVENOUS
  Filled 2017-10-19: qty 10

## 2017-10-19 MED ORDER — POTASSIUM PHOSPHATES 15 MMOLE/5ML IV SOLN
20.0000 mmol | Freq: Once | INTRAVENOUS | Status: AC
  Administered 2017-10-19: 20 mmol via INTRAVENOUS
  Filled 2017-10-19: qty 6.67

## 2017-10-19 MED ORDER — HEPARIN BOLUS VIA INFUSION
2000.0000 [IU] | Freq: Once | INTRAVENOUS | Status: AC
  Administered 2017-10-19: 2000 [IU] via INTRAVENOUS
  Filled 2017-10-19: qty 2000

## 2017-10-19 MED ORDER — TRACE MINERALS CR-CU-MN-SE-ZN 10-1000-500-60 MCG/ML IV SOLN
INTRAVENOUS | Status: AC
  Administered 2017-10-19: 17:00:00 via INTRAVENOUS
  Filled 2017-10-19: qty 960

## 2017-10-19 MED ORDER — HYDROCORTISONE 1 % EX CREA: 1.0000 "application " | TOPICAL_CREAM | Freq: Three times a day (TID) | CUTANEOUS | Status: DC | PRN

## 2017-10-19 MED ORDER — BOOST / RESOURCE BREEZE PO LIQD
1.0000 | Freq: Two times a day (BID) | ORAL | Status: DC
  Administered 2017-10-19 – 2017-10-20 (×3): 1 via ORAL

## 2017-10-19 MED ORDER — MAGNESIUM SULFATE 50 % IJ SOLN: 6.0000 g | Freq: Once | INTRAMUSCULAR | Status: DC

## 2017-10-19 NOTE — Progress Notes (Signed)
[place] NOTE   Pharmacy Consult for TPN Indication: poor po intake, unable to place repeat Gtube  Patient Measurements: Height: 5' 4.5" (163.8 cm) Weight: 123 lb 3.8 oz (55.9 kg) IBW/kg (Calculated) : 55.85 TPN AdjBW (KG): 59 Body mass index is 20.83 kg/m.  Central access: helding PICC placement to r/o no bacteremia, PICC placed 11/11 TPN start date: 11/11  ASSESSMENT                                                                                                          HPI: 52 yoF re-admit 11/8 with N/V, loose stools (baseline w/Crohn's). Recurrent mediastinal fluid abscess, possible splenic abscess on CT PMH: Crohns, HH, hx SBO Significant events: 10/3 - 10/21 admit for [facility] repair, ICU post-op on vent & pressors, extubated POD2, was on TPN, pelvic fluid collection-C albicans & C glabrata tx with Eraxis & Ertapenem x 2 wk total 10/26 admit with fevers, tx with Zosyn  Significant events:  11/9: repletion of electrolytes Magnesium 2gm bolus, Phosphorus, KCl, refusing Bicarb tablets 11/10: Boost tid, electrolyte repletion, Mag 4gm bolus, K Phos 20 mMol bolus, KCL runs x2 11/11: PICC placed, no am labs to aid in electrolyte repletion  Today, 10/19/17  Glucose - CBGs 91-119 at goal  Electrolytes - possible refeeding syndrome - K 2.9, mag 1.4, phos 2.3 - repletion orders have been done  Renal - SCr 0.94 stable  LFTs - AST 45 rose from 20  TGs - 444 (11/12), 104 (11/13)  Prealbumin - <5 (11/12)  Insulin Requirements: none  Current Nutrition: NPO  IVF: Sodium Bicarb infusion at 50 ml/hr - d/c'd 11/13 AM   NUTRITIONAL GOALS                                                                                             RD recs: 60-70 gm protein, 1475-1170 kCal per 24 hr  Clinimix 5/15 at a goal rate of 55 ml/hr + 20% fat emulsion at 60ml/hr to provide: 66 g/day protein, 1465 Kcal/day.  PLAN  At 1800:  Continue Clinimix E 5/15 and due to possible refeeding syndrome will continue TPN at current rate of 40 ml/hr until lytes stable  Note patient is ICU status and will not be started on lipids per TPN for at least 7 days from 11/11  Plan to advance as tolerated to the goal rate.  TPN to contain standard multivitamins and trace elements.  Continue current SSI/CBGs checks  TPN lab panels on Mondays & Thursdays.  F/u daily.   Adrian Saran, PharmD, BCPS Pager [phone] 10/19/2017 8:12 AM

## 2017-10-19 NOTE — Progress Notes (Signed)
Subjective: "I feel very tired"   Antibiotics:  Anti-infectives (From admission, onward)   Start     Dose/Rate Route Frequency Ordered Stop   10/17/17 0800  vancomycin (VANCOCIN) IVPB 750 mg/150 ml premix  Status:  Discontinued     750 mg 150 mL/hr over 60 Minutes Intravenous Every 24 hours 10/16/17 1220 10/17/17 1023   10/16/17 1800  anidulafungin (ERAXIS) 100 mg in sodium chloride 0.9 % 100 mL IVPB     100 mg 78 mL/hr over 100 Minutes Intravenous Every 24 hours 10/15/17 1657     10/16/17 0600  vancomycin (VANCOCIN) IVPB 1000 mg/200 mL premix  Status:  Discontinued     1,000 mg 200 mL/hr over 60 Minutes Intravenous Every 36 hours 10/07/2017 2308 10/16/17 1220   10/15/17 1800  anidulafungin (ERAXIS) 200 mg in sodium chloride 0.9 % 200 mL IVPB     200 mg 78 mL/hr over 200 Minutes Intravenous  Once 10/15/17 1657 10/15/17 2148   10/29/2017 2330  piperacillin-tazobactam (ZOSYN) IVPB 3.375 g  Status:  Discontinued     3.375 g 100 mL/hr over 30 Minutes Intravenous Every 8 hours 10/28/2017 2257 10/27/2017 2259   10/13/2017 2315  piperacillin-tazobactam (ZOSYN) IVPB 3.375 g     3.375 g 12.5 mL/hr over 240 Minutes Intravenous Every 8 hours 10/10/2017 2300     10/25/2017 1930  vancomycin (VANCOCIN) IVPB 1000 mg/200 mL premix     1,000 mg 200 mL/hr over 60 Minutes Intravenous  Once 10/25/2017 1915 10/25/2017 2044   10/29/2017 1530  piperacillin-tazobactam (ZOSYN) IVPB 3.375 g     3.375 g 100 mL/hr over 30 Minutes Intravenous  Once 10/07/2017 1529 10/18/2017 1604      Medications: Scheduled Meds: . Chlorhexidine Gluconate Cloth  6 each Topical Daily  . Chlorhexidine Gluconate Cloth  6 each Topical Daily  . feeding supplement  1 Container Oral BID BM  . heparin  2,000 Units Intravenous Once  . insulin aspart  0-5 Units Subcutaneous QHS  . insulin aspart  0-9 Units Subcutaneous TID WC  . lip balm  1 application Topical BID  . metoCLOPramide (REGLAN) injection  5 mg Intravenous Q8H  . psyllium  1  packet Oral Daily  . sodium bicarbonate  650 mg Oral TID  . sodium chloride flush  10-40 mL Intracatheter Q12H   Continuous Infusions: . anidulafungin Stopped (10/18/17 2052)  . heparin    . piperacillin-tazobactam (ZOSYN)  IV 3.375 g (10/19/17 1346)  . potassium PHOSPHATE IVPB (mmol) 20 mmol (10/19/17 1210)  . Marland KitchenTPN (CLINIMIX-E) Adult Stopped (10/19/17 1554)  . Marland KitchenTPN (CLINIMIX-E) Adult     PRN Meds:.[DISCONTINUED] acetaminophen **OR** acetaminophen, acetaminophen, alum & mag hydroxide-simeth, bisacodyl, dicyclomine, diphenhydrAMINE, diphenoxylate-atropine, guaiFENesin-dextromethorphan, hydrocortisone, hydrocortisone cream, magic mouthwash, menthol-cetylpyridinium, morphine injection, nystatin, phenol, promethazine, sodium chloride flush    Objective: Weight change:   Intake/Output Summary (Last 24 hours) at 10/19/2017 1657 Last data filed at 10/19/2017 1346 Gross per 24 hour  Intake 2855.33 ml  Output 950 ml  Net 1905.33 ml   Blood pressure (!) 126/51, pulse (!) 103, temperature 99.5 F (37.5 C), temperature source Oral, resp. rate 18, height 5' 4.5" (1.638 m), weight 123 lb 3.8 oz (55.9 kg), SpO2 99 %. Temp:  [98.8 F (37.1 C)-103 F (39.4 C)] 99.5 F (37.5 C) (11/13 1600) Pulse Rate:  [95-113] 103 (11/13 1600) Resp:  [18-37] 18 (11/13 1600) BP: (100-126)/(50-58) 126/51 (11/13 1600) SpO2:  [98 %-100 %] 99 % (11/13 1600)  Physical  Exam: General: Alert and awake, oriented x3, not in any acute distress. HEENT: anicteric sclera, EOMI CVS tachy rate, normal r,  no murmur rubs or gallops Chest: clear to auscultation bilaterally, no wheezing, rales or rhonchi Abdomen: bandage in place epigastrium Extremities: no  clubbing or edema noted bilaterally Skin: no rashes Neuro: nonfocal  CBC:  CBC Latest Ref Rng & Units 10/19/2017 10/19/2017 10/18/2017  WBC 4.0 - 10.5 K/uL - 24.3(H) 24.1(H)  Hemoglobin 12.0 - 15.0 g/dL 7.9(L) 7.6(L) 8.0(L)  Hematocrit 36.0 - 46.0 % 24.4(L)  22.9(L) 24.4(L)  Platelets 150 - 400 K/uL - 360 384      BMET Recent Labs    10/18/17 0542 10/19/17 0500  NA 132* 136  K 4.0 2.9*  CL 110 112*  CO2 16* 19*  GLUCOSE 444* 119*  BUN 7 8  CREATININE 0.97 0.94  CALCIUM 7.4* 7.2*     Liver Panel  Recent Labs    10/18/17 0542 10/19/17 0500  PROT 5.7* 5.7*  ALBUMIN 1.6* 1.6*  AST 20 45*  ALT 12* 20  ALKPHOS 60 77  BILITOT 0.3 0.8       Sedimentation Rate No results for input(s): ESRSEDRATE in the last 72 hours. C-Reactive Protein No results for input(s): CRP in the last 72 hours.  Micro Results: Recent Results (from the past 720 hour(s))  Blood Culture (routine x 2)     Status: None   Collection Time: 10/01/17 11:27 AM  Result Value Ref Range Status   Specimen Description BLOOD RIGHT ARM  Final   Special Requests IN PEDIATRIC BOTTLE Blood Culture adequate volume  Final   Culture   Final    NO GROWTH 5 DAYS Performed at [facility], [address] [place], [place] [address]    Report Status 10/06/2017 FINAL  Final  Blood Culture (routine x 2)     Status: None   Collection Time: 10/01/17  6:55 PM  Result Value Ref Range Status   Specimen Description BLOOD LEFT HAND  Final   Special Requests IN PEDIATRIC BOTTLE Blood Culture adequate volume  Final   Culture   Final    NO GROWTH 5 DAYS Performed at [facility], [place] [address], [place], [place] [address]    Report Status 10/06/2017 FINAL  Final  MRSA PCR Screening     Status: None   Collection Time: 10/04/17 11:03 AM  Result Value Ref Range Status   MRSA by PCR NEGATIVE NEGATIVE Final    Comment:        The GeneXpert MRSA Assay (FDA approved for NASAL specimens only), is one component of a comprehensive MRSA colonization surveillance program. It is not intended to diagnose MRSA infection nor to guide or monitor treatment for MRSA infections.   Culture, blood (Routine x 2)     Status: None   Collection Time: 10/25/2017  2:05 PM    Result Value Ref Range Status   Specimen Description BLOOD LEFT FOREARM  Final   Special Requests   Final    BOTTLES DRAWN AEROBIC AND ANAEROBIC Blood Culture results may not be optimal due to an inadequate volume of blood received in culture bottles   Culture   Final    NO GROWTH 5 DAYS Performed at [facility], [place] [address], [place],  [address]    Report Status 10/19/2017 FINAL  Final  MRSA PCR Screening     Status: None   Collection Time: 10/31/2017 10:45 PM  Result  Value Ref Range Status   MRSA by PCR NEGATIVE NEGATIVE Final    Comment:        The GeneXpert MRSA Assay (FDA approved for NASAL specimens only), is one component of a comprehensive MRSA colonization surveillance program. It is not intended to diagnose MRSA infection nor to guide or monitor treatment for MRSA infections.   Culture, blood (Routine x 2)     Status: None (Preliminary result)   Collection Time: 11/03/2017 11:28 PM  Result Value Ref Range Status   Specimen Description BLOOD LEFT HAND  Final   Special Requests IN PEDIATRIC BOTTLE Blood Culture adequate volume  Final   Culture   Final    NO GROWTH 4 DAYS Performed at [facility], [place] [address], [place], [place] [address]    Report Status PENDING  Incomplete  C difficile quick scan w PCR reflex     Status: None   Collection Time: 10/16/17  2:47 PM  Result Value Ref Range Status   C Diff antigen NEGATIVE NEGATIVE Final   C Diff toxin NEGATIVE NEGATIVE Final   C Diff interpretation No C. difficile detected.  Final  Gastrointestinal Panel by PCR , Stool     Status: None   Collection Time: 10/16/17  2:48 PM  Result Value Ref Range Status   Campylobacter species NOT DETECTED NOT DETECTED Final   Plesimonas shigelloides NOT DETECTED NOT DETECTED Final   Salmonella species NOT DETECTED NOT DETECTED Final   Yersinia enterocolitica NOT DETECTED NOT DETECTED Final   Vibrio species NOT DETECTED NOT DETECTED Final   Vibrio cholerae NOT  DETECTED NOT DETECTED Final   Enteroaggregative E coli (EAEC) NOT DETECTED NOT DETECTED Final   Enteropathogenic E coli (EPEC) NOT DETECTED NOT DETECTED Final   Enterotoxigenic E coli (ETEC) NOT DETECTED NOT DETECTED Final   Shiga like toxin producing E coli (STEC) NOT DETECTED NOT DETECTED Final   Shigella/Enteroinvasive E coli (EIEC) NOT DETECTED NOT DETECTED Final   Cryptosporidium NOT DETECTED NOT DETECTED Final   Cyclospora cayetanensis NOT DETECTED NOT DETECTED Final   Entamoeba histolytica NOT DETECTED NOT DETECTED Final   Giardia lamblia NOT DETECTED NOT DETECTED Final   Adenovirus F40/41 NOT DETECTED NOT DETECTED Final   Astrovirus NOT DETECTED NOT DETECTED Final   Norovirus GI/GII NOT DETECTED NOT DETECTED Final   Rotavirus A NOT DETECTED NOT DETECTED Final   Sapovirus (I, II, IV, and V) NOT DETECTED NOT DETECTED Final  Culture, fungus without smear     Status: None (Preliminary result)   Collection Time: 10/18/17  3:19 PM  Result Value Ref Range Status   Specimen Description Pleural, L  Final   Special Requests NONE  Final   Culture   Final    NO FUNGUS ISOLATED AFTER 1 DAY Performed at [facility], [address], [place], [place] [address]    Report Status PENDING  Incomplete  Culture, body fluid-bottle     Status: None (Preliminary result)   Collection Time: 10/18/17  3:19 PM  Result Value Ref Range Status   Specimen Description FLUID LEFT PLEURAL  Final   Special Requests BOTTLES DRAWN AEROBIC AND ANAEROBIC 10CC  Final   Culture   Final    NO GROWTH < 24 HOURS Performed at [facility], [place] [address], [place], [place] [address]    Report Status PENDING  Incomplete  Gram stain     Status: None   Collection Time: 10/18/17  3:19 PM  Result Value Ref Range Status   Specimen Description FLUID LEFT PLEURAL  Final   Special Requests NONE  Final   Gram Stain   Final    ABUNDANT WBC PRESENT,BOTH PMN AND MONONUCLEAR NO ORGANISMS SEEN Performed at [facility], [address] [place], [place] [address]    Report Status 10/18/2017 FINAL  Final    Studies/Results: Dg Chest 1 View  Result Date: 10/18/2017 CLINICAL DATA:  PICC line placement EXAM: CHEST 1 VIEW COMPARISON:  10/18/2017 FINDINGS: Limited by positioning. Left upper extremity catheter tip is looped back upon itself over the brachiocephalic region. Small pleural effusions. Stable cardiomegaly with vascular congestion and bibasilar airspace disease. Hiatal hernia. IMPRESSION: 1. Left upper extremity catheter tip is folded back upon itself over the brachiocephalic region 2. Small bilateral pleural effusions with bibasilar atelectasis or pneumonia. 3. Cardiomegaly with vascular congestion Electronically Signed   By: Donavan Foil M.D.   On: 10/18/2017 19:56   Dg Chest Port 1 View  Result Date: 10/18/2017 CLINICAL DATA:  PICC repositioning. EXAM: PORTABLE CHEST 1 VIEW COMPARISON:  Chest radiograph performed earlier today at 5:42 p.m. FINDINGS: The lungs are mildly hypoexpanded. Small bilateral pleural effusions are suspected. Vascular congestion is noted. Bilateral central airspace opacities may reflect pulmonary edema or possibly pneumonia. No pneumothorax is seen. The cardiomediastinal silhouette is mildly enlarged. No acute osseous abnormalities are identified. The patient's left PICC is noted ending overlying the proximal SVC. Previously noted coiling has resolved. IMPRESSION: 1. Left PICC noted ending overlying the proximal SVC. Previously noted coiling has resolved. 2. Lungs mildly hypoexpanded. Vascular congestion and mild cardiomegaly. Bilateral central airspace opacities may reflect pulmonary edema or possibly pneumonia. Electronically Signed   By: Garald Balding M.D.   On: 10/18/2017 22:22   Dg Chest Port 1 View  Result Date: 10/18/2017 CLINICAL DATA:  Status post left thoracentesis. EXAM: PORTABLE CHEST 1 VIEW COMPARISON:  10/26/2017 FINDINGS: There is decreased pleural fluid  on the left following thoracentesis. Residual left lung base opacity is likely atelectasis. There is no pneumothorax. There is opacity at the right lung base mostly obscuring hemidiaphragm consistent with a small right pleural effusion, new/increased when compared to the prior exam. A left PICC has been placed since the prior exam. Tip extends into the brachiocephalic vein but curls back on itself. It is unclear based on this single rotated AP view with this remains in the superior vena cava or extends back into the brachiocephalic vein. IMPRESSION: 1. Decreased left pleural fluid following thoracentesis. No pneumothorax or evidence of a procedure complication. 2. Left PICC positioned as detailed above, tip curling back on itself. 3. Small right pleural effusion. Electronically Signed   By: Lajean Manes M.D.   On: 10/18/2017 16:36   Dg Esophagus W/water Sol Cm  Result Date: 10/18/2017 CLINICAL DATA:  Nausea and vomiting.  Recent gastric surgery. EXAM: ESOPHOGRAM/BARIUM SWALLOW TECHNIQUE: Single contrast examination was performed using water soluble contrast. The study was performed in the LPO semi-upright position. FLUOROSCOPY TIME:  Fluoroscopy Time:  1 minutes 18 seconds Radiation Exposure Index (if provided by the fluoroscopic device): 15.3 mGy COMPARISON:  CT scans dated 10/13/2017 and 10/05/2017 FINDINGS: The oropharyngeal swallowing mechanisms appear normal. There is a 2.5 cm area of circumferential narrowing of the distal esophagus just proximal to the gastric fundus. This is in the region of the mediastinal fluid collections seen on the prior CT scan. Water-soluble contrast flows freely through this area. Danielle [name] tolerated the procedure well. IMPRESSION:  2.5 cm circumferential narrowing of the distal esophagus just above the gastric fundus. I suspect this is is due to a compression of the esophagus by the mediastinal fluid demonstrated on the prior CT scan. No obstruction. The patient was quite  reluctant to drink the contrast. Electronically Signed   By: Lorriane Shire M.D.   On: 10/18/2017 09:31   US Thoracentesis Asp Pleural Space W/img Guide  Result Date: 10/18/2017 INDICATION: Patient with history of mediastinal abscess, Crohn's disease, left pleural effusion. Request made for diagnostic and therapeutic left thoracentesis. EXAM: ULTRASOUND GUIDED DIAGNOSTIC AND THERAPEUTIC LEFT THORACENTESIS MEDICATIONS: None. COMPLICATIONS: None immediate. PROCEDURE: An ultrasound guided thoracentesis was thoroughly discussed with the patient and questions answered. The benefits, risks, alternatives and complications were also discussed. The patient understands and wishes to proceed with the procedure. Written consent was obtained. Ultrasound was performed to localize and mark an adequate pocket of fluid in the left chest. The area was then prepped and draped in the normal sterile fashion. 1% Lidocaine was used for local anesthesia. Under ultrasound guidance a Safe-T-Centesis catheter was introduced. Thoracentesis was performed. The catheter was removed and a dressing applied. FINDINGS: A total of approximately 780 cc of slightly hazy, blood-tinged fluid was removed. Samples were sent to the laboratory as requested by the clinical team. IMPRESSION: Successful ultrasound guided diagnostic and therapeutic left thoracentesis yielding 780 cc of pleural fluid. Read by: Rowe Robert, PA-C Electronically Signed   By: Markus Daft M.D.   On: 10/18/2017 16:35      Assessment/Plan:  INTERVAL HISTORY:  Pt sp thoracocentesis yesterday   Principal Problem:   Septic shock ([place]) Active Problems:   Leukocytosis   Perianal Crohn's disease, with fistula ([place])   Acute renal failure superimposed on stage 3 chronic kidney disease ([place])   Incarcerated hiatal hernia s/p reduction 09/08/2017   Immunosuppressed status (H[facility])   Hypotension   Nausea and vomiting   Anasarca   DVT (deep venous thrombosis) (H[facility])    Protein-calorie malnutrition, moderate (H[facility])   Mediastinal post-op seromas   Pleural effusion, left   Patient's noncompliance with intervention / medical advice     Danielle [name] is a 71 y.o. 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  She is on undergone exploratory laparotomy earlier in October which had shown purulent peritonitis and leaking around her gastrostomy tube.  Her pelvic abscesses have been drained and grew Candida glabrata and Candida albicans and she had C. Glabrata in her blood stream  Pleural fluid with low protein <3, LDH 187. WBC 2106 with lymphocytic predominance = exudate by Light's criteria but not by 3 step rule  Fluid glucose 113  Will followup pleural fluid cultures, CT in the am I understand  Continue zosyn and echinocandin for now  (note her C. Glabrata had dose dependent R/S with MIC of 32 so high dose azole might be an option of cost of the echinocandin might be quite high  DID SHE EVER HAVE FUNDOSCOPIC EXAM TO EXCLUDE FUNGAL ENDOPHTHALMITIS?  IF NOT please consult Optho for dilated fundoscopic exam    LOS: 5 days   Alcide Evener 10/19/2017, 4:57 PM

## 2017-10-19 NOTE — Progress Notes (Signed)
[facility]   Active pharmacy pt with [facility].  [facility] hospital team continues to follow pt for home infusion/home care needs.  If patient discharges after hours, please call [phone].   [name] 10/19/2017, 10:06 AM

## 2017-10-19 NOTE — Progress Notes (Signed)
Left upper arm PICC exchanged per policy due to malpositioned. Tolerated well.

## 2017-10-19 NOTE — Progress Notes (Signed)
Potassium 2.9-HH 7.6,22.9 - [facility] team notified.

## 2017-10-19 NOTE — Consult Note (Signed)
   [facility] Inpatient Consult   [id number]  RILEIGH KAWASHIMA [phone]   Patient screened for potential [facility] Care Management services due to multiple hospitalizations.   Went to bedside. Currently in stepdown unit. Undergoing sterile procedure upon bedside visit.   Will continue to follow and engage for potential [facility] Care Management services at a later time.    Marthenia Rolling, MSN-Ed, RN,BSN [facility] Liaison [phone]

## 2017-10-19 NOTE — Progress Notes (Signed)
ANTICOAGULATION CONSULT NOTE - Initial Consult  Pharmacy Consult for heparin Indication: bridge therapy while Eliquis for RUE DVT on hold  Allergies  Allergen Reactions  . Zithromax [Azithromycin] Swelling and Other (See Comments)    Mouth swelling and blisters   . Aspirin Other (See Comments)    Stomach cramping   . Ibuprofen Other (See Comments)    Pt states that she prefers not to take this medication.   . Iron Nausea And Vomiting and Other (See Comments)    FERROUS SULFATE  . Sulfa Antibiotics Nausea And Vomiting    Patient Measurements: Height: 5' 4.5" (163.8 cm) Weight: 123 lb 3.8 oz (55.9 kg) IBW/kg (Calculated) : 55.85 Heparin Dosing Weight: 55.9 kg  Vital Signs: Temp: 99.3 F (37.4 C) (11/13 1200) Temp Source: Oral (11/13 1200) BP: 100/50 (11/13 1200) Pulse Rate: 95 (11/13 1200)  Labs: Recent Labs    10/17/17 1126 10/18/17 0542 10/19/17 0500 10/19/17 1458  HGB 8.6* 8.0* 7.6* 7.9*  HCT 26.2* 24.4* 22.9* 24.4*  PLT 444* 384 360  --   CREATININE 1.01* 0.97 0.94  --     Estimated Creatinine Clearance: 49.8 mL/min (by C-G formula based on SCr of 0.94 mg/dL).   Medical History: Past Medical History:  Diagnosis Date  . Crohn disease ([place])   . Hiatal hernia 12/07/2015  . Left buttock abscess 04/21/2017  . Psoriasis   . SBO (small bowel obstruction) ([place]) 07/27/2017    Medications:  Medications Prior to Admission  Medication Sig Dispense Refill Last Dose  . albuterol (PROVENTIL HFA;VENTOLIN HFA) 108 (90 Base) MCG/ACT inhaler Inhale 2 puffs into the lungs every 6 (six) hours as needed for wheezing. 1 Inhaler 2 more than 30 days  . apixaban (ELIQUIS) 5 MG TABS tablet Take 10 mg twice a day for 7 days and then start using 5 mg BID. 60 tablet 3 10/13/2017 at 1800  . cyanocobalamin (,VITAMIN B-12,) 1000 MCG/ML injection INJECT 1000 MCG IM ONCE MONTHLY 1 mL 11 09/22/2017  . dicyclomine (BENTYL) 10 MG/5ML syrup TAKE 10 MLS (20 MG TOTAL) BY MOUTH 3 (THREE) TIMES  DAILY AS NEEDED. (Patient taking differently: Take 20 mg 3 (three) times daily as needed by mouth (stomach cramps). ) 300 mL 3 unknown  . diphenoxylate-atropine (LOMOTIL) 2.5-0.025 MG tablet TAKE 1 TABLET BY MOUTH FOUR TIMES A DAY AS NEEDED (Patient taking differently: Take one tablet twice daily.) 90 tablet 0 10/13/2017 at Unknown time  . feeding supplement (BOOST / RESOURCE BREEZE) LIQD Take 1 Container by mouth 3 (three) times daily between meals.     . furosemide (LASIX) 20 MG tablet Take 1 tablet (20 mg total) by mouth daily as needed (Take for weight gain of more than 2 pounds in a day or 5 pounds in 2 days.). 30 tablet 0 unknown  . HYDROcodone-acetaminophen (NORCO) 7.5-325 MG tablet Take 1 tablet by mouth every 6 (six) hours as needed for moderate pain or severe pain. 30 tablet 0 10/13/2017 at Unknown time  . magnesium oxide (MAG-OX) 400 (241.3 Mg) MG tablet Take 1 tablet (400 mg total) by mouth daily. 30 tablet 0 10/13/2017 at Unknown time  . nystatin (NYSTATIN) powder Apply 1 g topically 2 (two) times daily as needed (for itching/irritation).   unknown  . pantoprazole (PROTONIX) 40 MG tablet Take 1 tablet (40 mg total) by mouth daily.   10/13/2017 at Unknown time  . prochlorperazine (COMPAZINE) 10 MG tablet Take 1 tablet (10 mg total) by mouth every 6 (six) hours as  needed for refractory nausea / vomiting. 30 tablet 0 10/13/2017 at Unknown time  . sodium bicarbonate 650 MG tablet Take 650 mg by mouth 2 (two) times daily.   10/13/2017 at Unknown time  . spironolactone (ALDACTONE) 50 MG tablet Take 50 mg by mouth daily.    10/13/2017 at Unknown time  . acetaminophen (TYLENOL) 325 MG tablet Take 2 tablets (650 mg total) by mouth every 6 (six) hours as needed for mild pain or moderate pain. (Patient not taking: Reported on 10/12/2017) 40 tablet 0 Not Taking at Unknown time  . promethazine (PHENERGAN) 25 MG tablet TAKE 1 TABLET (25 MG TOTAL) BY MOUTH 2 (TWO) TIMES DAILY AS NEEDED FOR NAUSEA OR VOMITING.  (Patient not taking: Reported on 10/29/2017) 20 tablet 0 Not Taking at Unknown time    Assessment: 71 yo F on Eliquis PTA for DVT in RUE.  Last dose of Eliquis 10 mg po bid 11/7 at 1800, was due to start Eliquis 5 mg po BID on 11/8.  Pharmacy consulted to dose heparin for bridge therapy while Eliquis on hold in the ICU setting.  Hg 7.9, pltc WNL, Scr 0.94, wt 55.9 kg.  Transfused 11/9- for Hg 6.8.  S/p thoracentesis 11/12.   Goal of Therapy:  Heparin level 0.3-0.7 units/ml Monitor platelets by anticoagulation protocol: Yes   Plan:  Give 2000 units bolus x 1 Start heparin infusion at 850 units/hr Check anti-Xa level in 8 hours and daily while on heparin Continue to monitor H&H and platelets   Eudelia Bunch, Pharm.D. [phone] 10/19/2017 4:04 PM

## 2017-10-19 NOTE — Progress Notes (Addendum)
Nutrition Follow-up  DOCUMENTATION CODES:   Not applicable  INTERVENTION:  - Will order Boost Breeze po TID, each supplement provides 250 kcal and 9 grams of protein - Continue to encourage PO intakes.  - Diet advancement as medically feasible. - TPN/TPN advancement per Pharmacy. - RD will monitor for additional nutrition-related needs.   Monitor magnesium, potassium, and phosphorus daily, MD to replete as needed, as pt is at risk for refeeding syndrome given current hypokalemia, hypomagnesemia, and hypophosphatemia.   NUTRITION DIAGNOSIS:   Inadequate oral intake related to inability to eat as evidenced by NPO status. -diet advanced to [facility] on 11/11 with minimal intakes.   GOAL:   Patient will meet greater than or equal to 90% of their needs -unmet with PO intakes and current TPN rate.   MONITOR:   PO intake, Supplement acceptance, Diet advancement, Weight trends, Labs, Skin, Other (Comment)(TPN regimen)  ASSESSMENT:   71 y.o. female with medical history significant of Crohn's disease, hiatal hernia, left buttock abscess, psoriasis, small bowel obstruction who has been admitted twice in the past 5 weeks, first from 10/03-10/21 after undergoing type IV hiatal hernia repair. She improved clinically and was on TPN, however she developed a pelvic fluid collection which required a drain placement. She subsequently improved and was weaned off of TPN and was tolerating an oral diet. ID recommended to continue Invanz and Erasis for 2 weeks. A PICC line was placed in her right upper arm and she was subsequently discharged home. She returned on 10/26 due to intermittent fevers. She presented on 11/8 with complaints of persistent nausea associated with about 3 episodes of emesis daily. She has also had loose stools, but stated that this is her baseline since she suffers from Crohn's disease.   11/13 Pt with double lumen PICC in L arm and is receiving Clinimix E 5/15 @ 40 mL/hr with ILE on hold  per ICU protocol (start date: 10/23/17). This regimen is providing 48 grams of protein and 682 kcal. Per Pharmacy note, plan to hold at this rate until electrolyte abnormalities resolve. Goal for TPN per Pharmacy: Clinimix E 5/15 @ 55 mL/hr with 20% ILE @ 20 mL/hr x12 hours. This regimen will provide 66 grams of protein and 1417 kcal.   Pt sleeping at time of RD visit with no family/visitors present. She awoke to name call x4 and shoulder rub. She denies abdominal pain or nausea. Breakfast tray at bedside is untouched. Pt states she has been sipping Boost Breeze this AM and that she is planning to drink 2 of these supplements today. She feels that she has noticed a difference in her strength since she started drinking this supplement. She denies abdominal pain or nausea this AM and reports good tolerance of jello and Sprite for dinner last night.   No new weight since 11/9. Pt had thoracentesis yesterday with 780cc removed.  Medications reviewed; sliding scale Novolog, 6 g IV Mg sulfate x1 run today, 5 mg IV Reglan TID, 10 mEq IV KCl x3 runs today, 20 mmol IV KPhos x1 run today, 1 packet Metamucil/day, 650 mg oral sodium bicarb TID,  Labs reviewed; K: 2.9 mmol/L, Mg: 1.4 mg/dL, Phos: 2.3 mg/dL, Cl: 112 mmol/L, Ca: 7.2 mg/dL.  ADDENDUM: Noted that pt is stepdown level of care. Talked with Pharmacist about this to ask if ILE can be started today. He states that he prefers to continue to hold ILE start as triglycerides were high yesterday (219 mg/dL). Triglycerides trending down today (104 mg/dL). RD  hopeful that ILE can be started tomorrow given these changes as 20% ILE @ 20 mL/hr x12 hours will provide an additional 480 kcal/day.    11/9 - Pharmacist reported hypokalemia, hypomagnesemia, and hypophosphatemia and that she had ordered repletion for these.  - Pt requested that RD stop back ~30 minutes after initially attempted visit.  - When RD returned in ~45 minutes pt was still very drowsy, stating  that she had a rough night and that RN had told her she is supposed to just rest today.  - Some information does not align with information from other notes in the chart and RD is unsure if this is because of pt's drowsiness or if she is confused today.  - Pt reports last episode of emesis was this AM and that it was "clear water."  - She denies abdominal pain or nausea at this time but states she has chronic abdominal pain 2/2 hx of Crohn's disease.  - When asked if any foods are irritants for her because of Crohn's she states "I don't know, I don't eat anything that I know will bother it."  - Pt is unable to elaborate on this or give examples.  - She denies nausea PTA. - She states that emesis began yesterday evening, shortly before coming to the ED, but denies emesis at home in the days or weeks leading up to this admission.  - She denies any difficulties with swallowing. - Recommend goal for TPN: Clinimix E 5/15 @ 65 mL/hr with 20% ILE @ 20 mL/hr to provide 78 grams of protein and 1588 kcal.    Medications; 2 g IV Mg sulfate x1 run today, 20 mmol IV KPhos x1 run today Labs; K: 3.1 mmol/L, Phos: 2.1 mg/dL, Mg: 1.3 mg/dL  IVF: LR@ 100 mL/hr.      NUTRITION - FOCUSED PHYSICAL EXAM:    Most Recent Value  Orbital Region  No depletion  Upper Arm Region  No depletion  Thoracic and Lumbar Region  Unable to assess  Buccal Region  Mild depletion  Temple Region  No depletion  Clavicle Bone Region  Mild depletion  Clavicle and Acromion Bone Region  No depletion  Scapular Bone Region  No depletion  Dorsal Hand  No depletion  Patellar Region  No depletion  Anterior Thigh Region  No depletion  Posterior Calf Region  No depletion  Edema (RD Assessment)  None  Hair  Reviewed  Eyes  Reviewed  Mouth  Reviewed  Skin  Reviewed  Nails  Reviewed       Diet Order:  Diet clear liquid Room service appropriate? Yes; Fluid consistency: Thin; Fluid restriction: 1500 mL Fluid TPN (CLINIMIX-E)  Adult  EDUCATION NEEDS:   Not appropriate for education at this time  Skin:     Last BM:  11/12 via flexiseal  Height:   Ht Readings from Last 1 Encounters:  10/26/2017 5' 4.5" (1.638 m)    Weight:   Wt Readings from Last 1 Encounters:  10/15/17 123 lb 3.8 oz (55.9 kg)    Ideal Body Weight:  55.68 kg  BMI:  Body mass index is 20.83 kg/m.  Estimated Nutritional Needs:   Kcal:  1475-1770 (25-30 kcal/kg)  Protein:  60-70 grams (1-1.2 grams/kg)  Fluid:  >/= 1.6 L/day     Jarome Matin, MS, RD, LDN, [facility] Inpatient Clinical Dietitian Pager # [phone] After hours/weekend pager # [phone]

## 2017-10-19 NOTE — Progress Notes (Signed)
PT Cancellation Note  Patient Details Name: Danielle [name] MRN: [id number] DOB: July 03, 1946   Cancelled Treatment:    Reason Eval/Treat Not Completed: Patient declined, states that she wants to work with PT after the procedures and tests. Will check back later today.  [name] 10/19/2017, 8:26 AM [name] PT [phone]

## 2017-10-19 NOTE — Progress Notes (Addendum)
PROGRESS NOTE    Danielle [name]  QBH:[id number] DOB: January 12, 1946 DOA: 11/01/2017 PCP: Iona Beard, MD    Brief Narrative:  Danielle [name] is Danielle [name] 71 y.o. female with medical history significant of Crohn's disease, hiatal hernia, left buttock abscess, psoriasis, small bowel obstruction who has been recently admitted in the discharge twice in the past 5 weeks, first from 09/08/2017 to 09/26/2017 after undergoing type IV hiatal hernia repair.  Postoperatively the patient required endotracheal intubation, mechanical ventilation and the use of pressors.  She was extubated on postop day #2.  She improved clinically and was on parenteral nutrition, however she developed Danielle [name] pelvic fluid collection which require Danielle [name] drain placement.  On 09/11/2017 the patient blood cultures were positive for Candida glabrata and abscess culture were positive for C. Albicans and C. Glabrata. She was given treatment with parenteral antifungals. She subsequently improved was able to ambulate with physical therapy, weaned off of parenteral nutrition and was tolerating oral diet. ID recommended to continue Invanz and Erasis for 2 weeks. Danielle [name] PICC line was placed in her right upper arm and she was subsequently discharged home.  She returned on 10/01/2017 due to intermittent fevers.  She was placed on the neck slowly and cultures were obtained.  Antibiotics were discontinue 24 hours prior to discharge and the patient remained afebrile and clinically improved.  However, she returns today with complaints of persistent nausea associated with about 3 episodes of emesis daily.  She has also had loose stools, but stated that this is her baseline since she suffers from Crohn's disease. She denies fever, chills, rhinorrhea, sore throat, productive cough, dyspnea, chest pain, palpitations, dizziness, diaphoresis, PND, orthopnea or pitting edema of the lower extremities. She denies constipation, melena or hematochezia.  She denies dysuria, frequency or  hematuria.  Denies polyuria, polydipsia or blurred vision.  Assessment & Plan:   Principal Problem:   Septic shock ([place]) Active Problems:   Leukocytosis   Perianal Crohn's disease, with fistula ([place])   Acute renal failure superimposed on stage 3 chronic kidney disease ([place])   Incarcerated hiatal hernia s/p reduction 09/08/2017   Immunosuppressed status ([facility])   Hypotension   Nausea and vomiting   Anasarca   DVT (deep venous thrombosis) ([facility])   Protein-calorie malnutrition, moderate ([facility])   Mediastinal post-op seromas   Pleural effusion, left   Patient's noncompliance with intervention / medical advice    Sepsis 2/2 Mediastinal Abscess:  CT with "reaccumulation of fluid collection in lower mediastinum" Also possible small abscess along posterior margin of spleen. Thickening of herniated portion of stomach and esopagus, likely reactive Now s/p L sided thoracentesis on 11/12 with 780 cc of pleural fluid removed [ ]  exudative, gram stain with WBC and PMN's.   [ ]  Follow up body fluid culture, fungal culture, and cytology.  [ ]  Consider repeat CT with chest abdomen and pelvis.  Discussed with surgery who rec hold off on reimaging at this point.   Continue zosyn/eraxis, vancomycin has been d/c'd [ ]  Bcx from 11/8 NGTD x 48 hrs IVF IR consult, for thoracentesis as above (did not feel candidate for perc drain - recommended discussing with GI for potential endoscopic drainage who also did not feel candidate, but if thought necessary consider at tertiary care center). [ ]  follow up thoracentesis labs CT surgery if requested by surgery ID c/s, appreciate recs  Fevers:  Repeating blood and urine cultures with recurrent fevers.  CXR with bilateral central airspace opacities (edema vs pneumonia), but pt satting  well on RA and no SOB, will continue to monitor. [ ]  will f/u BNP for concern for edema on CXR  Decreased PO intake  Nausea Vomiting:  Reports no emesis yesterday.   TPA has been  started - per surgery, may need to go home with TPA at discharge.  [ ]  esophagram to rule out obstruction -> with 2.5 cm circumferential narrowing of distal esophagus above the gastric fundus (thought 2/2 compression by mediastinal fluid).  No obstruction.    Pyuria:  Urine cx (never added on), but low suspicion for sx UTI.  Repeating for recurrent fevers as noted above. Zosyn as above  Anemia: H/H fell to 6.8 11/9 afternoon.  Transfused 1 unit pRBC.  Slowly downtrending to 7.6 11/3.   [ ]  hemoccult stool negative [ ]  iron low to 6. Ferritin elevated in setting of infection.  B12 elevated.  Folate normal.  [ ]  daily H/H  Hypotension  Tachycardia:  2/2 above.  Some tachycardia overnight, seems associated with fevers.  Has somewhat of soft BP's at baseline and MAPs drop when sleeping. BP improved overall Goal MAPs >65 Continue IVF  Leukocytosis 2/2 above  Diarrhea:  C diff negative.  GI pathogen panel negative. Lomotil Rectal tube in place.    Hypokalemia  Hypomagnesemia  Hypophosphatemia: Replete prn  NAGMA:  2/2 NS administration, looks to be chronic as well on sodium bicarb. Briefly had pt on bicarb gtt with difficulty with PO bicarb, but will try PO again today after procedure.  HTN: holding home spironolactone  RUE DVT: holding eliquis in preparation for possible procedure.   Will need to resume when able. Will start heparin gtt for now.    History of crohns   PICC this AM  DVT prophylaxis: SCD Code Status: full Family Communication: none Disposition Plan: pending improvement   Consultants:   Surgery, IR, ID  Procedures: (Don't include imaging studies which can be auto populated. Include things that cannot be auto populated i.e. Echo, Carotid and venous dopplers, Foley, Bipap, HD, tubes/drains, wound vac, central lines etc)  PICC 11/11  Thoracentesis 11/12  Antimicrobials: (specify start and planned stop date. Auto populated tables are space occupying  and do not give end dates) Anti-infectives (From admission, onward)   Start     Dose/Rate Route Frequency Ordered Stop   10/17/17 0800  vancomycin (VANCOCIN) IVPB 750 mg/150 ml premix  Status:  Discontinued     750 mg 150 mL/hr over 60 Minutes Intravenous Every 24 hours 10/16/17 1220 10/17/17 1023   10/16/17 1800  anidulafungin (ERAXIS) 100 mg in sodium chloride 0.9 % 100 mL IVPB     100 mg 78 mL/hr over 100 Minutes Intravenous Every 24 hours 10/15/17 1657     10/16/17 0600  vancomycin (VANCOCIN) IVPB 1000 mg/200 mL premix  Status:  Discontinued     1,000 mg 200 mL/hr over 60 Minutes Intravenous Every 36 hours 10/09/2017 2308 10/16/17 1220   10/15/17 1800  anidulafungin (ERAXIS) 200 mg in sodium chloride 0.9 % 200 mL IVPB     200 mg 78 mL/hr over 200 Minutes Intravenous  Once 10/15/17 1657 10/15/17 2148   10/31/2017 2330  piperacillin-tazobactam (ZOSYN) IVPB 3.375 g  Status:  Discontinued     3.375 g 100 mL/hr over 30 Minutes Intravenous Every 8 hours 11/02/2017 2257 11/01/2017 2259   10/26/2017 2315  piperacillin-tazobactam (ZOSYN) IVPB 3.375 g     3.375 g 12.5 mL/hr over 240 Minutes Intravenous Every 8 hours 10/29/2017 2300  10/19/2017 1930  vancomycin (VANCOCIN) IVPB 1000 mg/200 mL premix     1,000 mg 200 mL/hr over 60 Minutes Intravenous  Once 10/29/2017 1915 10/16/2017 2044   10/16/2017 1530  piperacillin-tazobactam (ZOSYN) IVPB 3.375 g     3.375 g 100 mL/hr over 30 Minutes Intravenous  Once 11/02/2017 1529 10/30/2017 1604         Subjective: Feels better overall after procedure. Has some pain at site of thoracentesis. Noticed fevers. Feels more energy, less weak.   Objective: Vitals:   10/19/17 0100 10/19/17 0135 10/19/17 0200 10/19/17 0300  BP:  (!) 120/50    Pulse: (!) 109 (!) 113 (!) 110   Resp: (!) 29 (!) 27 (!) 37   Temp:  100.3 F (37.9 C)  (!) 100.8 F (38.2 C)  TempSrc:  Oral  Oral  SpO2: 100% 100% 100%   Weight:      Height:        Intake/Output Summary (Last 24 hours)  at 10/19/2017 0755 Last data filed at 10/19/2017 0537 Gross per 24 hour  Intake 3184.33 ml  Output 950 ml  Net 2234.33 ml   Filed Weights   10/31/2017 1248 10/15/17 1449  Weight: 59 kg (130 lb) 55.9 kg (123 lb 3.8 oz)   General: No acute distress.  Frail.  Cardiovascular: Heart sounds show Tenille Morrill regular rate, and rhythm. No gallops or rubs. No murmurs. No JVD. Lungs: Clear to auscultation bilaterally with good air movement. No rales, rhonchi or wheezes. Abdomen: Soft, nontender, nondistended,  Midline dressing. Neurological: Alert and oriented 3. Moves all extremities 4 with equal strength. Cranial nerves II through XII grossly intact. Skin: Warm and dry. No rashes or lesions. Extremities: No clubbing or cyanosis. No edema. Pedal pulses 2+. Psychiatric: Mood and affect are normal. Insight and judgment are appropriate.  Data Reviewed: I have personally reviewed following labs and imaging studies  CBC: Recent Labs  Lab 11/02/2017 1405 10/15/17 0400 10/15/17 1651 10/16/17 1346 10/17/17 1126 10/18/17 0542 10/19/17 0500  WBC 23.0* 22.7*  --  29.9* 28.7* 24.1* 24.3*  NEUTROABS 14.3* 17.0*  --   --  18.6* 15.7*  --   HGB 12.3 8.2* 6.8* 9.2* 8.6* 8.0* 7.6*  HCT 37.5 25.3* 20.8* 28.1* 26.2* 24.4* 22.9*  MCV 85.2 86.6  --  86.2 86.2 89.4 86.4  PLT 276 359  --  370 444* 384 956   Basic Metabolic Panel: Recent Labs  Lab 10/15/17 0400 10/16/17 1346 10/17/17 1126 10/18/17 0542 10/19/17 0500  NA 136 135 134* 132* 136  K 3.1* 3.6 4.4 4.0 2.9*  CL 112* 115* 114* 110 112*  CO2 14* 10* 13* 16* 19*  GLUCOSE 95 75 75 444* 119*  BUN 13 6 5* 7 8  CREATININE 1.06* 1.03* 1.01* 0.97 0.94  CALCIUM 7.4* 7.6* 7.4* 7.4* 7.2*  MG 1.3* 1.4* 2.0 2.0 1.4*  PHOS 2.1* 2.1* 2.5 3.5 2.3*   GFR: Estimated Creatinine Clearance: 49.8 mL/min (by C-G formula based on SCr of 0.94 mg/dL). Liver Function Tests: Recent Labs  Lab 10/15/17 0400 10/16/17 1346 10/17/17 1126 10/18/17 0542 10/19/17 0500    AST 37 23 23 20  45*  ALT 25 16 14  12* 20  ALKPHOS 81 72 71 60 77  BILITOT 0.6 0.9 1.0 0.3 0.8  PROT 7.4 6.4* 6.3* 5.7* 5.7*  ALBUMIN 2.1* 1.9* 1.8* 1.6* 1.6*   Recent Labs  Lab 11/04/2017 1405  LIPASE 34   No results for input(s): AMMONIA in the last 168 hours. Coagulation  Profile: Recent Labs  Lab 10/19/2017 1405 10/15/17 0400  INR 1.69 1.52   Cardiac Enzymes: No results for input(s): CKTOTAL, CKMB, CKMBINDEX, TROPONINI in the last 168 hours. BNP (last 3 results) No results for input(s): PROBNP in the last 8760 hours. HbA1C: No results for input(s): HGBA1C in the last 72 hours. CBG: Recent Labs  Lab 10/16/17 1125 10/18/17 0829 10/18/17 1126 10/18/17 1530 10/18/17 2106  GLUCAP 74 105* 97 91 111*   Lipid Profile: Recent Labs    10/18/17 0542  TRIG 444*   Thyroid Function Tests: No results for input(s): TSH, T4TOTAL, FREET4, T3FREE, THYROIDAB in the last 72 hours. Anemia Panel: Recent Labs    10/18/17 1138  VITAMINB12 1,171*  FOLATE 10.1  FERRITIN 843*  TIBC NOT CALCULATED  IRON 6*   Sepsis Labs: Recent Labs  Lab 10/24/2017 1420  LATICACIDVEN 1.78    Recent Results (from the past 240 hour(s))  Culture, blood (Routine x 2)     Status: None (Preliminary result)   Collection Time: 10/28/2017  2:05 PM  Result Value Ref Range Status   Specimen Description BLOOD LEFT FOREARM  Final   Special Requests   Final    BOTTLES DRAWN AEROBIC AND ANAEROBIC Blood Culture results may not be optimal due to an inadequate volume of blood received in culture bottles   Culture   Final    NO GROWTH 4 DAYS Performed at [facility], [place] [address], [place], [place] [address]    Report Status PENDING  Incomplete  MRSA PCR Screening     Status: None   Collection Time: 10/07/2017 10:45 PM  Result Value Ref Range Status   MRSA by PCR NEGATIVE NEGATIVE Final    Comment:        The GeneXpert MRSA Assay (FDA approved for NASAL specimens only), is one component of  Ranjit Ashurst comprehensive MRSA colonization surveillance program. It is not intended to diagnose MRSA infection nor to guide or monitor treatment for MRSA infections.   Culture, blood (Routine x 2)     Status: None (Preliminary result)   Collection Time: 10/22/2017 11:28 PM  Result Value Ref Range Status   Specimen Description BLOOD LEFT HAND  Final   Special Requests IN PEDIATRIC BOTTLE Blood Culture adequate volume  Final   Culture   Final    NO GROWTH 3 DAYS Performed at [facility], [place] [address], [place], [place] [address]    Report Status PENDING  Incomplete  C difficile quick scan w PCR reflex     Status: None   Collection Time: 10/16/17  2:47 PM  Result Value Ref Range Status   C Diff antigen NEGATIVE NEGATIVE Final   C Diff toxin NEGATIVE NEGATIVE Final   C Diff interpretation No C. difficile detected.  Final  Gastrointestinal Panel by PCR , Stool     Status: None   Collection Time: 10/16/17  2:48 PM  Result Value Ref Range Status   Campylobacter species NOT DETECTED NOT DETECTED Final   Plesimonas shigelloides NOT DETECTED NOT DETECTED Final   Salmonella species NOT DETECTED NOT DETECTED Final   Yersinia enterocolitica NOT DETECTED NOT DETECTED Final   Vibrio species NOT DETECTED NOT DETECTED Final   Vibrio cholerae NOT DETECTED NOT DETECTED Final   Enteroaggregative E coli (EAEC) NOT DETECTED NOT DETECTED Final   Enteropathogenic E coli (EPEC) NOT DETECTED NOT DETECTED Final   Enterotoxigenic E coli (ETEC) NOT DETECTED NOT DETECTED Final   Shiga like toxin  producing E coli (STEC) NOT DETECTED NOT DETECTED Final   Shigella/Enteroinvasive E coli (EIEC) NOT DETECTED NOT DETECTED Final   Cryptosporidium NOT DETECTED NOT DETECTED Final   Cyclospora cayetanensis NOT DETECTED NOT DETECTED Final   Entamoeba histolytica NOT DETECTED NOT DETECTED Final   Giardia lamblia NOT DETECTED NOT DETECTED Final   Adenovirus F40/41 NOT DETECTED NOT DETECTED Final   Astrovirus NOT  DETECTED NOT DETECTED Final   Norovirus GI/GII NOT DETECTED NOT DETECTED Final   Rotavirus Wyndell Cardiff NOT DETECTED NOT DETECTED Final   Sapovirus (I, II, IV, and V) NOT DETECTED NOT DETECTED Final  Gram stain     Status: None   Collection Time: 10/18/17  3:19 PM  Result Value Ref Range Status   Specimen Description FLUID LEFT PLEURAL  Final   Special Requests NONE  Final   Gram Stain   Final    ABUNDANT WBC PRESENT,BOTH PMN AND MONONUCLEAR NO ORGANISMS SEEN Performed at [facility], [address], [place], [place] [address]    Report Status 10/18/2017 FINAL  Final         Radiology Studies: Dg Chest 1 View  Result Date: 10/18/2017 CLINICAL DATA:  PICC line placement EXAM: CHEST 1 VIEW COMPARISON:  10/18/2017 FINDINGS: Limited by positioning. Left upper extremity catheter tip is looped back upon itself over the brachiocephalic region. Small pleural effusions. Stable cardiomegaly with vascular congestion and bibasilar airspace disease. Hiatal hernia. IMPRESSION: 1. Left upper extremity catheter tip is folded back upon itself over the brachiocephalic region 2. Small bilateral pleural effusions with bibasilar atelectasis or pneumonia. 3. Cardiomegaly with vascular congestion Electronically Signed   By: Donavan Foil M.D.   On: 10/18/2017 19:56   Dg Chest Port 1 View  Result Date: 10/18/2017 CLINICAL DATA:  PICC repositioning. EXAM: PORTABLE CHEST 1 VIEW COMPARISON:  Chest radiograph performed earlier today at 5:42 p.m. FINDINGS: The lungs are mildly hypoexpanded. Small bilateral pleural effusions are suspected. Vascular congestion is noted. Bilateral central airspace opacities may reflect pulmonary edema or possibly pneumonia. No pneumothorax is seen. The cardiomediastinal silhouette is mildly enlarged. No acute osseous abnormalities are identified. The patient's left PICC is noted ending overlying the proximal SVC. Previously noted coiling has resolved. IMPRESSION: 1. Left PICC noted ending  overlying the proximal SVC. Previously noted coiling has resolved. 2. Lungs mildly hypoexpanded. Vascular congestion and mild cardiomegaly. Bilateral central airspace opacities may reflect pulmonary edema or possibly pneumonia. Electronically Signed   By: Garald Balding M.D.   On: 10/18/2017 22:22   Dg Chest Port 1 View  Result Date: 10/18/2017 CLINICAL DATA:  Status post left thoracentesis. EXAM: PORTABLE CHEST 1 VIEW COMPARISON:  11/02/2017 FINDINGS: There is decreased pleural fluid on the left following thoracentesis. Residual left lung base opacity is likely atelectasis. There is no pneumothorax. There is opacity at the right lung base mostly obscuring hemidiaphragm consistent with Krystyl Cannell small right pleural effusion, new/increased when compared to the prior exam. Yoandri Congrove left PICC has been placed since the prior exam. Tip extends into the brachiocephalic vein but curls back on itself. It is unclear based on this single rotated AP view with this remains in the superior vena cava or extends back into the brachiocephalic vein. IMPRESSION: 1. Decreased left pleural fluid following thoracentesis. No pneumothorax or evidence of Shamonica Schadt procedure complication. 2. Left PICC positioned as detailed above, tip curling back on itself. 3. Small right pleural effusion. Electronically Signed   By: Lajean Manes M.D.   On: 10/18/2017 16:36  Dg Esophagus W/water Sol Cm  Result Date: 10/18/2017 CLINICAL DATA:  Nausea and vomiting.  Recent gastric surgery. EXAM: ESOPHOGRAM/BARIUM SWALLOW TECHNIQUE: Single contrast examination was performed using water soluble contrast. The study was performed in the LPO semi-upright position. FLUOROSCOPY TIME:  Fluoroscopy Time:  1 minutes 18 seconds Radiation Exposure Index (if provided by the fluoroscopic device): 15.3 mGy COMPARISON:  CT scans dated 10/31/2017 and 10/05/2017 FINDINGS: The oropharyngeal swallowing mechanisms appear normal. There is Desjuan Stearns 2.5 cm area of circumferential narrowing of the  distal esophagus just proximal to the gastric fundus. This is in the region of the mediastinal fluid collections seen on the prior CT scan. Water-soluble contrast flows freely through this area. Ms. Yeatman tolerated the procedure well. IMPRESSION: 2.5 cm circumferential narrowing of the distal esophagus just above the gastric fundus. I suspect this is is due to Yomara Toothman compression of the esophagus by the mediastinal fluid demonstrated on the prior CT scan. No obstruction. The patient was quite reluctant to drink the contrast. Electronically Signed   By: Lorriane Shire M.D.   On: 10/18/2017 09:31   US Thoracentesis Asp Pleural Space W/img Guide  Result Date: 10/18/2017 INDICATION: Patient with history of mediastinal abscess, Crohn's disease, left pleural effusion. Request made for diagnostic and therapeutic left thoracentesis. EXAM: ULTRASOUND GUIDED DIAGNOSTIC AND THERAPEUTIC LEFT THORACENTESIS MEDICATIONS: None. COMPLICATIONS: None immediate. PROCEDURE: An ultrasound guided thoracentesis was thoroughly discussed with the patient and questions answered. The benefits, risks, alternatives and complications were also discussed. The patient understands and wishes to proceed with the procedure. Written consent was obtained. Ultrasound was performed to localize and mark an adequate pocket of fluid in the left chest. The area was then prepped and draped in the normal sterile fashion. 1% Lidocaine was used for local anesthesia. Under ultrasound guidance Glorie Dowlen Safe-T-Centesis catheter was introduced. Thoracentesis was performed. The catheter was removed and Dorthey Depace dressing applied. FINDINGS: Haly Feher total of approximately 780 cc of slightly hazy, blood-tinged fluid was removed. Samples were sent to the laboratory as requested by the clinical team. IMPRESSION: Successful ultrasound guided diagnostic and therapeutic left thoracentesis yielding 780 cc of pleural fluid. Read by: Rowe Robert, PA-C Electronically Signed   By: Markus Daft M.D.    On: 10/18/2017 16:35        Scheduled Meds: . Chlorhexidine Gluconate Cloth  6 each Topical Daily  . Chlorhexidine Gluconate Cloth  6 each Topical Daily  . insulin aspart  0-5 Units Subcutaneous QHS  . insulin aspart  0-9 Units Subcutaneous TID WC  . lip balm  1 application Topical BID  . metoCLOPramide (REGLAN) injection  5 mg Intravenous Q8H  . psyllium  1 packet Oral Daily  . sodium bicarbonate  650 mg Oral TID  . sodium chloride flush  10-40 mL Intracatheter Q12H   Continuous Infusions: . anidulafungin Stopped (10/18/17 2052)  . magnesium sulfate IVPB 4 gram    . piperacillin-tazobactam (ZOSYN)  IV 3.375 g (10/19/17 0537)  . potassium chloride 10 mEq (10/19/17 0755)  . potassium PHOSPHATE IVPB (mmol)    .  sodium bicarbonate (isotonic) infusion in sterile water 50 mL/hr at 10/18/17 1617  . Marland KitchenTPN (CLINIMIX-E) Adult 40 mL/hr at 10/18/17 1707     LOS: 5 days    Time spent: over 20 minutes    Fayrene Helper, MD [facility] Pager [phone]  If 7PM-7AM, please contact night-coverage [email or web address] Password [email or web address] 10/19/2017, 7:55 AM

## 2017-10-19 NOTE — Progress Notes (Signed)
Subjective/Chief Complaint: Pt with NAE overnight. tol some PO and better ater thora Pt refused PT today   Objective: Vital signs in last 24 hours: Temp:  [98.8 F (37.1 C)-103 F (39.4 C)] 99.3 F (37.4 C) (11/13 1200) Pulse Rate:  [95-113] 95 (11/13 1200) Resp:  [26-37] 32 (11/13 1200) BP: (100-139)/(50-70) 100/50 (11/13 1200) SpO2:  [98 %-100 %] 100 % (11/13 1200) Last BM Date: 10/19/17  Intake/Output from previous day: 11/12 0701 - 11/13 0700 In: 3184.3 [P.O.:480; I.V.:2394.3; IV Piggyback:310] Out: 950 [Urine:500; Stool:450] Intake/Output this shift: Total I/O In: 812 [I.V.:200; IV Piggyback:612] Out: -   General appearance: alert and cooperative GI: soft, non-tender; bowel sounds normal; no masses,  no organomegaly  Lab Results:  Recent Labs    10/18/17 0542 10/19/17 0500 10/19/17 1458  WBC 24.1* 24.3*  --   HGB 8.0* 7.6* 7.9*  HCT 24.4* 22.9* 24.4*  PLT 384 360  --    BMET Recent Labs    10/18/17 0542 10/19/17 0500  NA 132* 136  K 4.0 2.9*  CL 110 112*  CO2 16* 19*  GLUCOSE 444* 119*  BUN 7 8  CREATININE 0.97 0.94  CALCIUM 7.4* 7.2*   PT/INR No results for input(s): LABPROT, INR in the last 72 hours. ABG No results for input(s): PHART, HCO3 in the last 72 hours.  Invalid input(s): PCO2, PO2  Studies/Results: Dg Chest 1 View  Result Date: 10/18/2017 CLINICAL DATA:  PICC line placement EXAM: CHEST 1 VIEW COMPARISON:  10/18/2017 FINDINGS: Limited by positioning. Left upper extremity catheter tip is looped back upon itself over the brachiocephalic region. Small pleural effusions. Stable cardiomegaly with vascular congestion and bibasilar airspace disease. Hiatal hernia. IMPRESSION: 1. Left upper extremity catheter tip is folded back upon itself over the brachiocephalic region 2. Small bilateral pleural effusions with bibasilar atelectasis or pneumonia. 3. Cardiomegaly with vascular congestion Electronically Signed   By: Donavan Foil M.D.    On: 10/18/2017 19:56   Dg Chest Port 1 View  Result Date: 10/18/2017 CLINICAL DATA:  PICC repositioning. EXAM: PORTABLE CHEST 1 VIEW COMPARISON:  Chest radiograph performed earlier today at 5:42 p.m. FINDINGS: The lungs are mildly hypoexpanded. Small bilateral pleural effusions are suspected. Vascular congestion is noted. Bilateral central airspace opacities may reflect pulmonary edema or possibly pneumonia. No pneumothorax is seen. The cardiomediastinal silhouette is mildly enlarged. No acute osseous abnormalities are identified. The patient's left PICC is noted ending overlying the proximal SVC. Previously noted coiling has resolved. IMPRESSION: 1. Left PICC noted ending overlying the proximal SVC. Previously noted coiling has resolved. 2. Lungs mildly hypoexpanded. Vascular congestion and mild cardiomegaly. Bilateral central airspace opacities may reflect pulmonary edema or possibly pneumonia. Electronically Signed   By: Garald Balding M.D.   On: 10/18/2017 22:22   Dg Chest Port 1 View  Result Date: 10/18/2017 CLINICAL DATA:  Status post left thoracentesis. EXAM: PORTABLE CHEST 1 VIEW COMPARISON:  10/25/2017 FINDINGS: There is decreased pleural fluid on the left following thoracentesis. Residual left lung base opacity is likely atelectasis. There is no pneumothorax. There is opacity at the right lung base mostly obscuring hemidiaphragm consistent with a small right pleural effusion, new/increased when compared to the prior exam. A left PICC has been placed since the prior exam. Tip extends into the brachiocephalic vein but curls back on itself. It is unclear based on this single rotated AP view with this remains in the superior vena cava or extends back into the brachiocephalic vein. IMPRESSION: 1. Decreased  left pleural fluid following thoracentesis. No pneumothorax or evidence of a procedure complication. 2. Left PICC positioned as detailed above, tip curling back on itself. 3. Small right pleural  effusion. Electronically Signed   By: Lajean Manes M.D.   On: 10/18/2017 16:36   Dg Esophagus W/water Sol Cm  Result Date: 10/18/2017 CLINICAL DATA:  Nausea and vomiting.  Recent gastric surgery. EXAM: ESOPHOGRAM/BARIUM SWALLOW TECHNIQUE: Single contrast examination was performed using water soluble contrast. The study was performed in the LPO semi-upright position. FLUOROSCOPY TIME:  Fluoroscopy Time:  1 minutes 18 seconds Radiation Exposure Index (if provided by the fluoroscopic device): 15.3 mGy COMPARISON:  CT scans dated 10/19/2017 and 10/05/2017 FINDINGS: The oropharyngeal swallowing mechanisms appear normal. There is a 2.5 cm area of circumferential narrowing of the distal esophagus just proximal to the gastric fundus. This is in the region of the mediastinal fluid collections seen on the prior CT scan. Water-soluble contrast flows freely through this area. Ms. Mccalister tolerated the procedure well. IMPRESSION: 2.5 cm circumferential narrowing of the distal esophagus just above the gastric fundus. I suspect this is is due to a compression of the esophagus by the mediastinal fluid demonstrated on the prior CT scan. No obstruction. The patient was quite reluctant to drink the contrast. Electronically Signed   By: Lorriane Shire M.D.   On: 10/18/2017 09:31   US Thoracentesis Asp Pleural Space W/img Guide  Result Date: 10/18/2017 INDICATION: Patient with history of mediastinal abscess, Crohn's disease, left pleural effusion. Request made for diagnostic and therapeutic left thoracentesis. EXAM: ULTRASOUND GUIDED DIAGNOSTIC AND THERAPEUTIC LEFT THORACENTESIS MEDICATIONS: None. COMPLICATIONS: None immediate. PROCEDURE: An ultrasound guided thoracentesis was thoroughly discussed with the patient and questions answered. The benefits, risks, alternatives and complications were also discussed. The patient understands and wishes to proceed with the procedure. Written consent was obtained. Ultrasound was  performed to localize and mark an adequate pocket of fluid in the left chest. The area was then prepped and draped in the normal sterile fashion. 1% Lidocaine was used for local anesthesia. Under ultrasound guidance a Safe-T-Centesis catheter was introduced. Thoracentesis was performed. The catheter was removed and a dressing applied. FINDINGS: A total of approximately 780 cc of slightly hazy, blood-tinged fluid was removed. Samples were sent to the laboratory as requested by the clinical team. IMPRESSION: Successful ultrasound guided diagnostic and therapeutic left thoracentesis yielding 780 cc of pleural fluid. Read by: Rowe Robert, PA-C Electronically Signed   By: Markus Daft M.D.   On: 10/18/2017 16:35    Anti-infectives: Anti-infectives (From admission, onward)   Start     Dose/Rate Route Frequency Ordered Stop   10/17/17 0800  vancomycin (VANCOCIN) IVPB 750 mg/150 ml premix  Status:  Discontinued     750 mg 150 mL/hr over 60 Minutes Intravenous Every 24 hours 10/16/17 1220 10/17/17 1023   10/16/17 1800  anidulafungin (ERAXIS) 100 mg in sodium chloride 0.9 % 100 mL IVPB     100 mg 78 mL/hr over 100 Minutes Intravenous Every 24 hours 10/15/17 1657     10/16/17 0600  vancomycin (VANCOCIN) IVPB 1000 mg/200 mL premix  Status:  Discontinued     1,000 mg 200 mL/hr over 60 Minutes Intravenous Every 36 hours 10/07/2017 2308 10/16/17 1220   10/15/17 1800  anidulafungin (ERAXIS) 200 mg in sodium chloride 0.9 % 200 mL IVPB     200 mg 78 mL/hr over 200 Minutes Intravenous  Once 10/15/17 1657 10/15/17 2148   10/11/2017 2330  piperacillin-tazobactam (ZOSYN) IVPB  3.375 g  Status:  Discontinued     3.375 g 100 mL/hr over 30 Minutes Intravenous Every 8 hours 11/02/2017 2257 10/12/2017 2259   10/13/2017 2315  piperacillin-tazobactam (ZOSYN) IVPB 3.375 g     3.375 g 12.5 mL/hr over 240 Minutes Intravenous Every 8 hours 10/26/2017 2300     10/18/2017 1930  vancomycin (VANCOCIN) IVPB 1000 mg/200 mL premix     1,000  mg 200 mL/hr over 60 Minutes Intravenous  Once 11/04/2017 1915 10/10/2017 2044   10/16/2017 1530  piperacillin-tazobactam (ZOSYN) IVPB 3.375 g     3.375 g 100 mL/hr over 30 Minutes Intravenous  Once 10/23/2017 1529 11/01/2017 1604      Assessment/Plan: -Con't TNA & Abx -OK for heparin gtt -Cont' with PT and OOBTC TID -Pt OK for CT chest tomorrow.  States she feels tired today.   LOS: 5 days    [name]., [facility] 10/19/2017

## 2017-10-20 DIAGNOSIS — I10 Essential (primary) hypertension: Secondary | ICD-10-CM

## 2017-10-20 DIAGNOSIS — I82A11 Acute embolism and thrombosis of right axillary vein: Secondary | ICD-10-CM

## 2017-10-20 DIAGNOSIS — J948 Other specified pleural conditions: Secondary | ICD-10-CM

## 2017-10-20 DIAGNOSIS — I272 Pulmonary hypertension, unspecified: Secondary | ICD-10-CM

## 2017-10-20 DIAGNOSIS — R6520 Severe sepsis without septic shock: Secondary | ICD-10-CM

## 2017-10-20 DIAGNOSIS — I82A12 Acute embolism and thrombosis of left axillary vein: Secondary | ICD-10-CM

## 2017-10-20 DIAGNOSIS — I5032 Chronic diastolic (congestive) heart failure: Secondary | ICD-10-CM

## 2017-10-20 DIAGNOSIS — E860 Dehydration: Secondary | ICD-10-CM

## 2017-10-20 LAB — HEPARIN LEVEL (UNFRACTIONATED)
HEPARIN UNFRACTIONATED: 0.21 [IU]/mL — AB (ref 0.30–0.70)
Heparin Unfractionated: 0.12 IU/mL — ABNORMAL LOW (ref 0.30–0.70)

## 2017-10-20 LAB — COMPREHENSIVE METABOLIC PANEL
ALBUMIN: 1.6 g/dL — AB (ref 3.5–5.0)
ALT: 50 U/L (ref 14–54)
AST: 117 U/L — AB (ref 15–41)
Alkaline Phosphatase: 109 U/L (ref 38–126)
Anion gap: 3 — ABNORMAL LOW (ref 5–15)
BILIRUBIN TOTAL: 0.5 mg/dL (ref 0.3–1.2)
BUN: 10 mg/dL (ref 6–20)
CO2: 20 mmol/L — ABNORMAL LOW (ref 22–32)
CREATININE: 0.85 mg/dL (ref 0.44–1.00)
Calcium: 7.6 mg/dL — ABNORMAL LOW (ref 8.9–10.3)
Chloride: 112 mmol/L — ABNORMAL HIGH (ref 101–111)
GFR calc Af Amer: >60 mL/min (ref >60)
GLUCOSE: 109 mg/dL — AB (ref 65–99)
POTASSIUM: 3.4 mmol/L — AB (ref 3.5–5.1)
Sodium: 135 mmol/L (ref 135–145)
TOTAL PROTEIN: 6.1 g/dL — AB (ref 6.5–8.1)

## 2017-10-20 LAB — CBC
HCT: 23.3 % — ABNORMAL LOW (ref 36.0–46.0)
Hemoglobin: 7.6 g/dL — ABNORMAL LOW (ref 12.0–15.0)
MCH: 28.1 pg (ref 26.0–34.0)
MCHC: 32.6 g/dL (ref 30.0–36.0)
MCV: 86.3 fL (ref 78.0–100.0)
Platelets: 362 10*3/uL (ref 150–400)
RBC: 2.7 MIL/uL — AB (ref 3.87–5.11)
RDW: 17.2 % — AB (ref 11.5–15.5)
WBC: 24.2 10*3/uL — ABNORMAL HIGH (ref 4.0–10.5)

## 2017-10-20 LAB — GLUCOSE, CAPILLARY
GLUCOSE-CAPILLARY: 102 mg/dL — AB (ref 65–99)
Glucose-Capillary: 97 mg/dL (ref 65–99)
Glucose-Capillary: 88 mg/dL (ref 65–99)
Glucose-Capillary: 93 mg/dL (ref 65–99)

## 2017-10-20 LAB — MAGNESIUM: MAGNESIUM: 2.4 mg/dL (ref 1.7–2.4)

## 2017-10-20 LAB — IRON AND TIBC: IRON: 9 ug/dL — AB (ref 28–170)

## 2017-10-20 LAB — RETICULOCYTES
RBC.: 2.84 MIL/uL — ABNORMAL LOW (ref 3.87–5.11)
RETIC COUNT ABSOLUTE: 36.9 10*3/uL (ref 19.0–186.0)
Retic Ct Pct: 1.3 % (ref 0.4–3.1)

## 2017-10-20 LAB — CULTURE, BLOOD (ROUTINE X 2)
Culture: NO GROWTH
SPECIAL REQUESTS: ADEQUATE

## 2017-10-20 LAB — HCV COMMENT:

## 2017-10-20 LAB — FOLATE: FOLATE: 8.9 ng/mL (ref >5.9)

## 2017-10-20 LAB — TRIGLYCERIDES: TRIGLYCERIDES: 79 mg/dL (ref <150)

## 2017-10-20 LAB — HIV ANTIBODY (ROUTINE TESTING W REFLEX): HIV Screen 4th Generation wRfx: NONREACTIVE

## 2017-10-20 LAB — OCCULT BLOOD X 1 CARD TO LAB, STOOL: Fecal Occult Bld: NEGATIVE

## 2017-10-20 LAB — FERRITIN: FERRITIN: 1237 ng/mL — AB (ref 11–307)

## 2017-10-20 LAB — PHOSPHORUS: Phosphorus: 3.1 mg/dL (ref 2.5–4.6)

## 2017-10-20 LAB — HEPATITIS C ANTIBODY (REFLEX): HCV AB: 0.1 {s_co_ratio} (ref 0.0–0.9)

## 2017-10-20 LAB — PREPARE RBC (CROSSMATCH)

## 2017-10-20 LAB — VITAMIN B12: VITAMIN B 12: 1289 pg/mL — AB (ref 180–914)

## 2017-10-20 IMAGING — DX DG CHEST 2V
2 series · 2 of 2 positions shown · non-contrast
Comparison: 12/05/2015

CLINICAL DATA: Shortness of breath, wheezing

EXAM:
CHEST  2 VIEW

[chest pa]
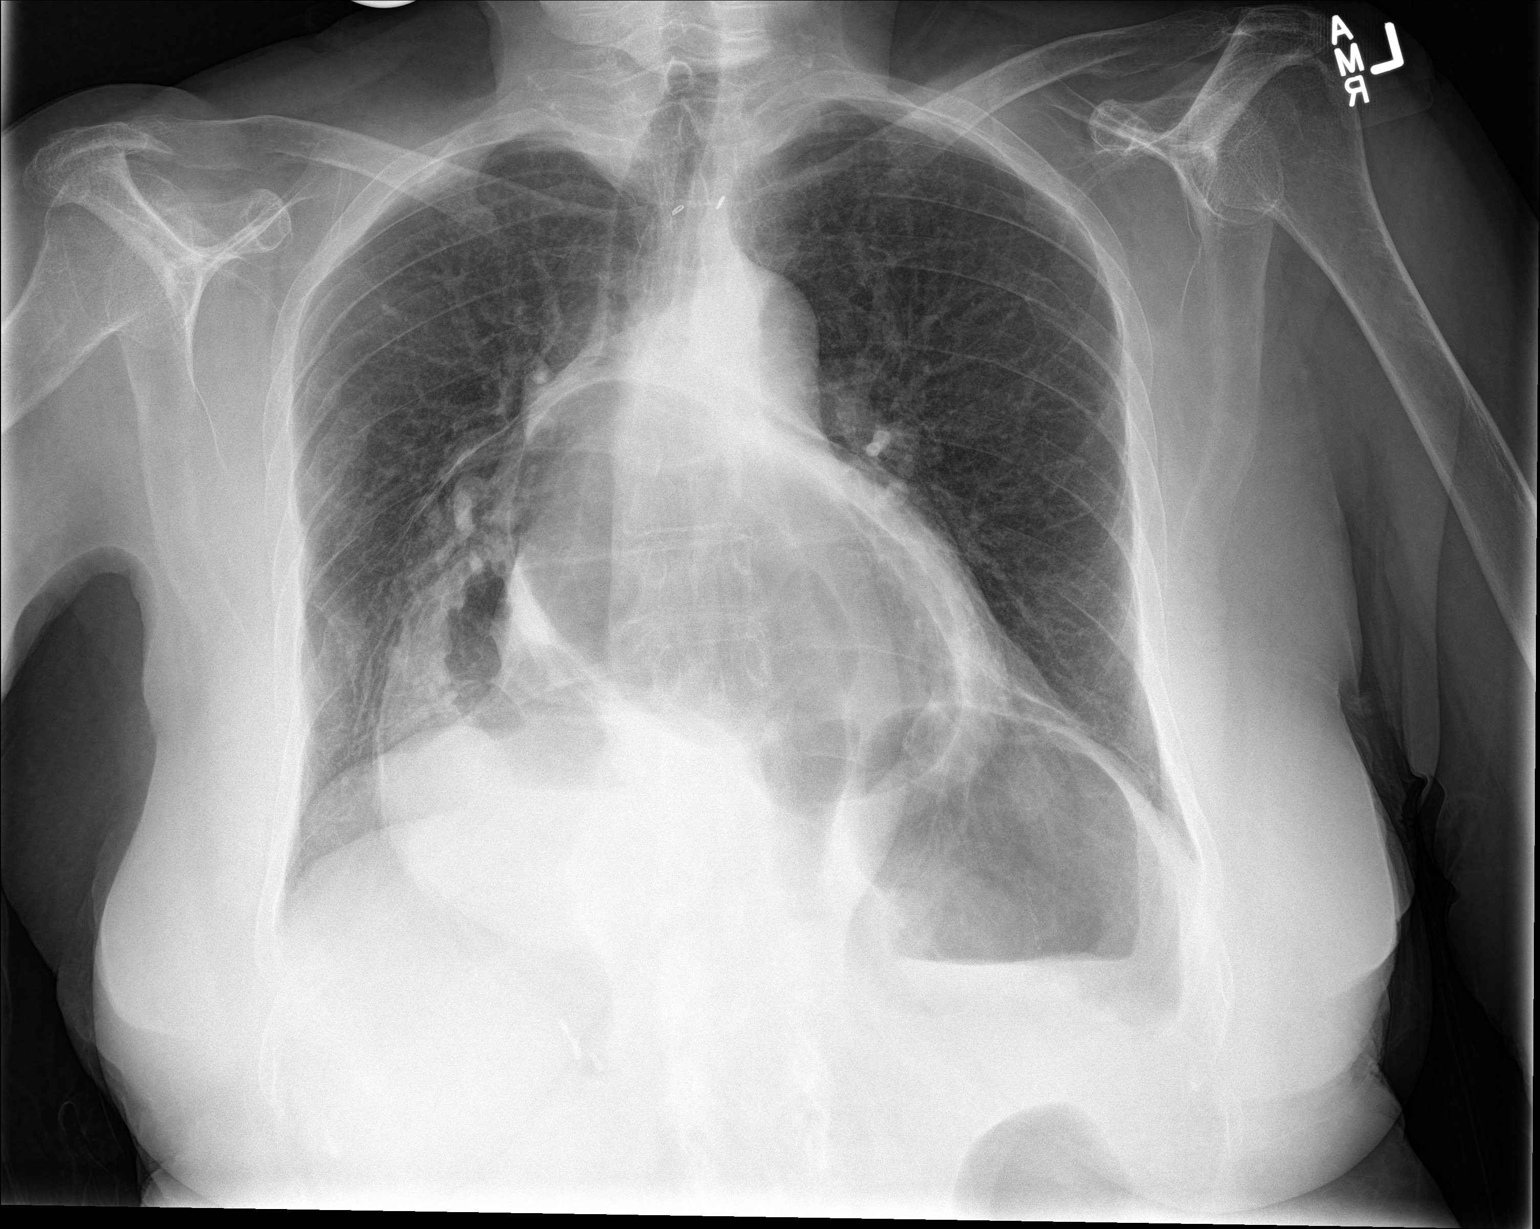

[chest lat]
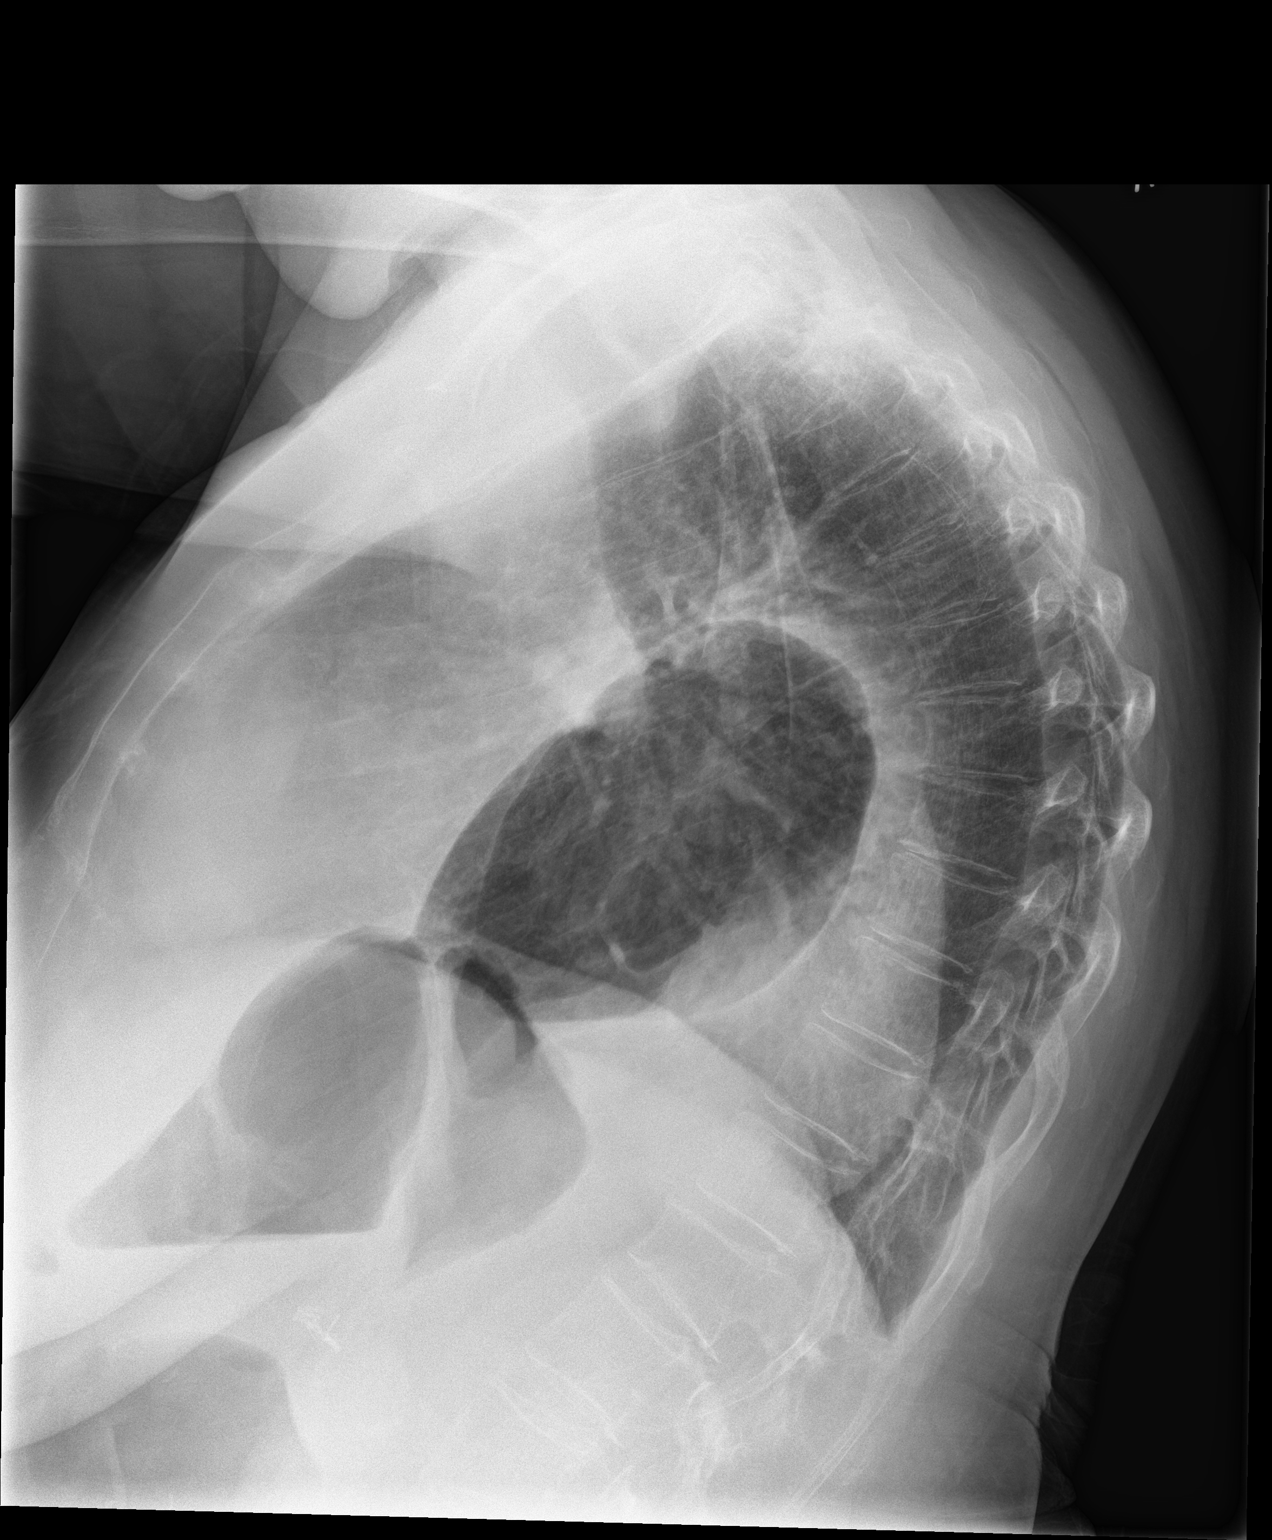

[2 of 2 positions shown; findings below may reference images not displayed]

FINDINGS: Cardiomediastinal silhouette is stable. Very large hiatal hernia
containing at least 2 /3 of stomach and probable transverse colon
again noted. No infiltrate or pulmonary edema. Diffuse osteopenia.
Stable mild compression deformity upper lumbar spine.
IMPRESSION: No active disease. Very large hiatal/diaphragmatic hernia again
noted. Diffuse osteopenia.

## 2017-10-20 MED ORDER — POTASSIUM CHLORIDE 20 MEQ/15ML (10%) PO SOLN
50.0000 meq | Freq: Once | ORAL | Status: DC
  Filled 2017-10-20: qty 45

## 2017-10-20 MED ORDER — POTASSIUM CHLORIDE CRYS ER 20 MEQ PO TBCR
40.0000 meq | EXTENDED_RELEASE_TABLET | Freq: Once | ORAL | Status: AC
  Administered 2017-10-20: 40 meq via ORAL
  Filled 2017-10-20: qty 2

## 2017-10-20 MED ORDER — DEXTROSE-NACL 5-0.9 % IV SOLN
INTRAVENOUS | Status: DC
  Administered 2017-10-20: 22:00:00 via INTRAVENOUS

## 2017-10-20 MED ORDER — SODIUM CHLORIDE 0.9 % IV SOLN
Freq: Once | INTRAVENOUS | Status: AC
  Administered 2017-10-20: 20 mL via INTRAVENOUS

## 2017-10-20 MED ORDER — HEPARIN BOLUS VIA INFUSION
2000.0000 [IU] | Freq: Once | INTRAVENOUS | Status: AC
  Administered 2017-10-20: 2000 [IU] via INTRAVENOUS
  Filled 2017-10-20: qty 2000

## 2017-10-20 MED ORDER — M.V.I. ADULT IV INJ
INJECTION | INTRAVENOUS | Status: AC
  Administered 2017-10-20: 18:00:00 via INTRAVENOUS
  Filled 2017-10-20 (×2): qty 1320

## 2017-10-20 MED ORDER — FAT EMULSION 20 % IV EMUL
240.0000 mL | INTRAVENOUS | Status: DC
  Administered 2017-10-20: 240 mL via INTRAVENOUS
  Filled 2017-10-20: qty 250

## 2017-10-20 MED ORDER — HEPARIN (PORCINE) IN NACL 100-0.45 UNIT/ML-% IJ SOLN: 1600.0000 [IU]/h | INTRAMUSCULAR | Status: DC

## 2017-10-20 NOTE — Progress Notes (Signed)
Nutrition Follow-up  DOCUMENTATION CODES:   Not applicable  INTERVENTION:  - Will order Magic Cup BID with meals (lunch and dinner), each supplement provides 290 kcal and 9 grams of protein. - Continue Boost Breeze BID.  - Continue to encourage PO intakes. - Continue diet advancement as medically feasible. - TPN per Pharmacy.   NUTRITION DIAGNOSIS:   Inadequate oral intake related to inability to eat as evidenced by NPO status. -diet advanced from CLD to FLD this AM, ongoing very minimal intakes.   GOAL:   Patient will meet greater than or equal to 90% of their needs -unmet at this time.   MONITOR:   PO intake, Supplement acceptance, Diet advancement, Weight trends, Labs, Skin, Other (Comment)(TPN regimen)  ASSESSMENT:   71 y.o. female with medical history significant of Crohn's disease, hiatal hernia, left buttock abscess, psoriasis, small bowel obstruction who has been admitted twice in the past 5 weeks, first from 10/03-10/21 after undergoing type IV hiatal hernia repair. She improved clinically and was on TPN, however she developed a pelvic fluid collection which required a drain placement. She subsequently improved and was weaned off of TPN and was tolerating an oral diet. ID recommended to continue Invanz and Erasis for 2 weeks. A PICC line was placed in her right upper arm and she was subsequently discharged home. She returned on 10/26 due to intermittent fevers. She presented on 11/8 with complaints of persistent nausea associated with about 3 episodes of emesis daily. She has also had loose stools, but stated that this is her baseline since she suffers from Crohn's disease.   11/14 Diet advanced from CLD to FLD today at 7:27 AM. Breakfast tray on bedside table and pt had consumed a few sips of milk and 1/2 cup of orange juice. She is also sipping on Boost Breeze and has been accepting every dose of this supplement since order was placed yesterday AM. Pt denies abdominal pain or  nausea with or without PO intakes but states that she feels unable to consume much PO at a time.   Spoke with Pharmacist who states plan to increase TPN to goal rate and add ILE today. This regimen will be Clinimix E 5/15 @ 55 mL/hr with 20% ILE @ 20 mL/hr which will provide 66 grams of protein and 1417 kcal (96% minimum estimated kcal need). This regimen + minimal PO intakes will meet needs.   No weight since 11/9; RD ordered for pt to be weighed today. She is to have repeat chest CT today. Pt refused to work with PT yesterday.   Medications reviewed; sliding scale Novolog, 5 mg IV Reglan TID, 50 mEq oral KCl x1 dose today, 1 packet Metamucil/day, 650 mg oral sodium bicarb TID.  Labs reviewed; CBG: 97 mg/dL this AM, K: 3.2 mmol/L, Cl: 112 mmol/L, Ca: 7.6 mg/dL, AST elevated, triglycerides: 79 mg/dL this AM.      11/13 - Pt with double lumen PICC in L arm. - Receiving Clinimix E 5/15 @ 40 mL/hr with ILE on hold per ICU protocol (start date: 10/23/17).  - This regimen is providing 48 grams of protein and 682 kcal.  - Per Pharmacy note, plan to hold at this rate until electrolyte abnormalities resolve.  - Goal for TPN per Pharmacy: Clinimix E 5/15 @ 55 mL/hr with 20% ILE @ 20 mL/hr x12 hours. This regimen will provide 66 grams of protein and 1417 kcal.  - She denies abdominal pain or nausea.  - Breakfast tray at bedside is  untouched.  - Pt states she has been sipping Boost Breeze this AM and that she is planning to drink 2 of these supplements today.  - She feels that she has noticed a difference in her strength since she started drinking this supplement.  - Reports good tolerance of jello and Sprite for dinner last night.  - Pt had thoracentesis yesterday with 780cc removed.  Medications; 6 g IV Mg sulfate x1 run today, 5 mg IV Reglan TID, 10 mEq IV KCl x3 runs today, 20 mmol IV KPhos x1 run today Labs; K: 2.9 mmol/L, Mg: 1.4 mg/dL, Phos: 2.3 mg/dL  ADDENDUM: Noted that pt is stepdown  level of care. Talked with Pharmacist about this to ask if ILE can be started today. He states that he prefers to continue to hold ILE start as triglycerides were high yesterday (219 mg/dL). Triglycerides trending down today (104 mg/dL). RD hopeful that ILE can be started tomorrow given these changes as 20% ILE @ 20 mL/hr x12 hours will provide an additional 480 kcal/day.      Diet Order:  TPN (CLINIMIX-E) Adult Diet full liquid Room service appropriate? Yes; Fluid consistency: Thin  EDUCATION NEEDS:   Not appropriate for education at this time  Skin:     Last BM:  11/13  Height:   Ht Readings from Last 1 Encounters:  11/03/2017 5' 4.5" (1.638 m)    Weight:   Wt Readings from Last 1 Encounters:  10/15/17 123 lb 3.8 oz (55.9 kg)    Ideal Body Weight:  55.68 kg  BMI:  Body mass index is 20.83 kg/m.  Estimated Nutritional Needs:   Kcal:  1475-1770 (25-30 kcal/kg)  Protein:  60-70 grams (1-1.2 grams/kg)  Fluid:  >/= 1.6 L/day     Jarome Matin, MS, RD, LDN, [facility] Inpatient Clinical Dietitian Pager # [phone] After hours/weekend pager # [phone]

## 2017-10-20 NOTE — Progress Notes (Signed)
[facility] for heparin Indication: bridge therapy while Eliquis for RUE DVT on hold  Allergies  Allergen Reactions  . Zithromax [Azithromycin] Swelling and Other (See Comments)    Mouth swelling and blisters   . Aspirin Other (See Comments)    Stomach cramping   . Ibuprofen Other (See Comments)    Pt states that she prefers not to take this medication.   . Iron Nausea And Vomiting and Other (See Comments)    FERROUS SULFATE  . Sulfa Antibiotics Nausea And Vomiting    Patient Measurements: Height: 5' 4.5" (163.8 cm) Weight: 123 lb 3.8 oz (55.9 kg) IBW/kg (Calculated) : 55.85 Heparin Dosing Weight: 55.9 kg  Vital Signs: Temp: 100 F (37.8 C) (11/14 0000) Temp Source: Oral (11/14 0000) BP: 107/47 (11/14 0000) Pulse Rate: 96 (11/14 0200)  Labs: Recent Labs    10/17/17 1126 10/18/17 0542 10/19/17 0500 10/19/17 1458 10/20/17 0149  HGB 8.6* 8.0* 7.6* 7.9*  --   HCT 26.2* 24.4* 22.9* 24.4*  --   PLT 444* 384 360  --   --   HEPARINUNFRC  --   --   --   --  <0.10*  CREATININE 1.01* 0.97 0.94  --   --     Estimated Creatinine Clearance: 49.8 mL/min (by C-G formula based on SCr of 0.94 mg/dL).   Medical History: Past Medical History:  Diagnosis Date  . Crohn disease ([place])   . Hiatal hernia 12/07/2015  . Left buttock abscess 04/21/2017  . Psoriasis   . SBO (small bowel obstruction) ([place]) 07/27/2017    Medications:  Medications Prior to Admission  Medication Sig Dispense Refill Last Dose  . albuterol (PROVENTIL HFA;VENTOLIN HFA) 108 (90 Base) MCG/ACT inhaler Inhale 2 puffs into the lungs every 6 (six) hours as needed for wheezing. 1 Inhaler 2 more than 30 days  . apixaban (ELIQUIS) 5 MG TABS tablet Take 10 mg twice a day for 7 days and then start using 5 mg BID. 60 tablet 3 10/13/2017 at 1800  . cyanocobalamin (,VITAMIN B-12,) 1000 MCG/ML injection INJECT 1000 MCG IM ONCE MONTHLY 1 mL 11 09/22/2017  . dicyclomine (BENTYL) 10 MG/5ML  syrup TAKE 10 MLS (20 MG TOTAL) BY MOUTH 3 (THREE) TIMES DAILY AS NEEDED. (Patient taking differently: Take 20 mg 3 (three) times daily as needed by mouth (stomach cramps). ) 300 mL 3 unknown  . diphenoxylate-atropine (LOMOTIL) 2.5-0.025 MG tablet TAKE 1 TABLET BY MOUTH FOUR TIMES A DAY AS NEEDED (Patient taking differently: Take one tablet twice daily.) 90 tablet 0 10/13/2017 at Unknown time  . feeding supplement (BOOST / RESOURCE BREEZE) LIQD Take 1 Container by mouth 3 (three) times daily between meals.     . furosemide (LASIX) 20 MG tablet Take 1 tablet (20 mg total) by mouth daily as needed (Take for weight gain of more than 2 pounds in a day or 5 pounds in 2 days.). 30 tablet 0 unknown  . HYDROcodone-acetaminophen (NORCO) 7.5-325 MG tablet Take 1 tablet by mouth every 6 (six) hours as needed for moderate pain or severe pain. 30 tablet 0 10/13/2017 at Unknown time  . magnesium oxide (MAG-OX) 400 (241.3 Mg) MG tablet Take 1 tablet (400 mg total) by mouth daily. 30 tablet 0 10/13/2017 at Unknown time  . nystatin (NYSTATIN) powder Apply 1 g topically 2 (two) times daily as needed (for itching/irritation).   unknown  . pantoprazole (PROTONIX) 40 MG tablet Take 1 tablet (40 mg total) by mouth daily.  10/13/2017 at Unknown time  . prochlorperazine (COMPAZINE) 10 MG tablet Take 1 tablet (10 mg total) by mouth every 6 (six) hours as needed for refractory nausea / vomiting. 30 tablet 0 10/13/2017 at Unknown time  . sodium bicarbonate 650 MG tablet Take 650 mg by mouth 2 (two) times daily.   10/13/2017 at Unknown time  . spironolactone (ALDACTONE) 50 MG tablet Take 50 mg by mouth daily.    10/13/2017 at Unknown time  . acetaminophen (TYLENOL) 325 MG tablet Take 2 tablets (650 mg total) by mouth every 6 (six) hours as needed for mild pain or moderate pain. (Patient not taking: Reported on 10/12/2017) 40 tablet 0 Not Taking at Unknown time  . promethazine (PHENERGAN) 25 MG tablet TAKE 1 TABLET (25 MG TOTAL) BY MOUTH 2  (TWO) TIMES DAILY AS NEEDED FOR NAUSEA OR VOMITING. (Patient not taking: Reported on 10/09/2017) 20 tablet 0 Not Taking at Unknown time    Assessment: 71 yo F on Eliquis PTA for DVT in RUE.  Last dose of Eliquis 10 mg po bid 11/7 at 1800.  She was due to start Eliquis 5 mg po BID on 11/8, but it has been on hold since admitted to hospital.   Pharmacy consulted to dose heparin for bridge therapy while Eliquis on hold in the ICU setting.  Hg 7.9, pltc WNL, Scr 0.94, wt 55.9 kg.  Transfused 11/9- for Hg 6.8.  S/p thoracentesis 11/12.   10/20/2017:  Initial heparin level <0.1 on 850units/hr (goal 0.3-0.7)  No bleeding or infusion related issues reported by RN  CBC for today pending  Goal of Therapy:  Heparin level 0.3-0.7 units/ml Monitor platelets by anticoagulation protocol: Yes   Plan:  Repeat heparin 2000 units IV bolus x1 then heparin infusion infusion to 1000 units/hr Repeat heparin level in 6hrs Daily heparin level & CBC while on heparin  10/20/2017 4:13 AM

## 2017-10-20 NOTE — H&P (View-Only) (Signed)
Reason for Consult:mediastinal fluid Referring Physician: Dr. Othelia Pulling Danielle [name] is an 71 y.o. female.  HPI: 71 yo woman with a past history of Crohn's, hiatal hernia, psoriasis and a SBO, who had repair of a paraesophageal hernia on 09/08/2017. Had re-exploration for revision of a GT on 10/5. Was hospitalized for a month and complicated by DVT and moderate protein calorie malnutrition. Had fungemia with C. Glabrata C. Albicans, Treated with Invanz and anidulafungin. DC on 11/1. Readmitted a week later with intermittent fevers and difficulty swallowing. CT chest abdomen and pelvis showed a fluid collection in the mediastinum. She also had a small left effusion. Thoracentesis showed an exudate. IR was not comfortable placing a drain in the mediastinum. Currently on TPN and taking small amounts of clear liquids.  Zubrod Score: At the time of surgery this patient's most appropriate activity status/level should be described as: _0     0    Normal activity, no symptoms _1     1    Restricted in physical strenuous activity but ambulatory, able to do out light work _2     2    Ambulatory and capable of self care, unable to do work activities, up and about >50 % of waking hours                              _3     3    Only limited self care, in bed greater than 50% of waking hours _4     4    Completely disabled, no self care, confined to bed or chair _5     5    Moribund   Past Medical History:  Diagnosis Date  . Crohn disease ([place])   . Hiatal hernia 12/07/2015  . Left buttock abscess 04/21/2017  . Psoriasis   . SBO (small bowel obstruction) ([place]) 07/27/2017    Past Surgical History:  Procedure Laterality Date  . ANAL STENOSIS  ~ 2010  . APPENDECTOMY  1980's  . BOWEL RESECTION  2009  . CHOLECYSTECTOMY  1990's  . COLONOSCOPY  2013  . COLOSTOMY  2009   DEMASO  . COLOSTOMY TAKEDOWN  2009   "only had it for about 1 month" (08/22/2013)  . goiter  1999    Family History  Problem Relation Age of  Onset  . Diabetes Mother   . Heart disease Father   . Healthy Sister   . Hypertension Brother   . Colon cancer Brother        died with colon cancer  . Crohn's disease Paternal Aunt     Social History:  reports that she quit smoking about 21 years ago. Her smoking use included cigarettes. She has a 0.48 pack-year smoking history. she has never used smokeless tobacco. She reports that she does not drink alcohol or use drugs.  Allergies:  Allergies  Allergen Reactions  . Zithromax [Azithromycin] Swelling and Other (See Comments)    Mouth swelling and blisters   . Aspirin Other (See Comments)    Stomach cramping   . Ibuprofen Other (See Comments)    Pt states that she prefers not to take this medication.   . Iron Nausea And Vomiting and Other (See Comments)    FERROUS SULFATE  . Sulfa Antibiotics Nausea And Vomiting    Medications:  Scheduled: . Chlorhexidine Gluconate Cloth  6 each Topical Daily  . feeding supplement  1 Container Oral BID BM  . insulin aspart  0-5 Units Subcutaneous QHS  . insulin aspart  0-9 Units Subcutaneous TID WC  . lip balm  1 application Topical BID  . metoCLOPramide (REGLAN) injection  5 mg Intravenous Q8H  . psyllium  1 packet Oral Daily  . sodium bicarbonate  650 mg Oral TID  . sodium chloride flush  10-40 mL Intracatheter Q12H    Results for orders placed or performed during the hospital encounter of 10/08/2017 (from the past 48 hour(s))  Glucose, capillary     Status: Abnormal   Collection Time: 10/18/17  9:06 PM  Result Value Ref Range   Glucose-Capillary 111 (H) 65 - 99 mg/dL   Comment 1 Notify RN    Comment 2 Document in Chart   Comprehensive metabolic panel     Status: Abnormal   Collection Time: 10/19/17  5:00 AM  Result Value Ref Range   Sodium 136 135 - 145 mmol/L   Potassium 2.9 (L) 3.5 - 5.1 mmol/L    Comment: DELTA CHECK NOTED REPEATED TO VERIFY    Chloride 112 (H) 101 - 111 mmol/L   CO2 19 (L) 22 - 32 mmol/L   Glucose, Bld  119 (H) 65 - 99 mg/dL   BUN 8 6 - 20 mg/dL   Creatinine, Ser 0.94 0.44 - 1.00 mg/dL   Calcium 7.2 (L) 8.9 - 10.3 mg/dL   Total Protein 5.7 (L) 6.5 - 8.1 g/dL   Albumin 1.6 (L) 3.5 - 5.0 g/dL   AST 45 (H) 15 - 41 U/L   ALT 20 14 - 54 U/L   Alkaline Phosphatase 77 38 - 126 U/L   Total Bilirubin 0.8 0.3 - 1.2 mg/dL   GFR calc non Af Amer >60 >60 mL/min   GFR calc Af Amer >60 >60 mL/min    Comment: (NOTE) The eGFR has been calculated using the CKD EPI equation. This calculation has not been validated in all clinical situations. eGFR's persistently <60 mL/min signify possible Chronic Kidney Disease.    Anion gap 5 5 - 15  Magnesium     Status: Abnormal   Collection Time: 10/19/17  5:00 AM  Result Value Ref Range   Magnesium 1.4 (L) 1.7 - 2.4 mg/dL  Phosphorus     Status: Abnormal   Collection Time: 10/19/17  5:00 AM  Result Value Ref Range   Phosphorus 2.3 (L) 2.5 - 4.6 mg/dL  Triglycerides     Status: None   Collection Time: 10/19/17  5:00 AM  Result Value Ref Range   Triglycerides 104 <150 mg/dL    Comment: Performed at [facility], [place] [address] [place], [place] [address]  CBC     Status: Abnormal   Collection Time: 10/19/17  5:00 AM  Result Value Ref Range   WBC 24.3 (H) 4.0 - 10.5 K/uL   RBC 2.65 (L) 3.87 - 5.11 MIL/uL   Hemoglobin 7.6 (L) 12.0 - 15.0 g/dL   HCT 22.9 (L) 36.0 - 46.0 %   MCV 86.4 78.0 - 100.0 fL   MCH 28.7 26.0 - 34.0 pg   MCHC 33.2 30.0 - 36.0 g/dL   RDW 17.3 (H) 11.5 - 15.5 %   Platelets 360 150 - 400 K/uL  Glucose, capillary     Status: Abnormal   Collection Time: 10/19/17  7:30 AM  Result Value Ref Range   Glucose-Capillary 108 (H) 65 - 99 mg/dL  Culture, blood (routine x 2)     Status: None (Preliminary result)   Collection Time: 10/19/17  9:45 AM  Result Value Ref Range   Specimen Description BLOOD BLOOD RIGHT FOREARM    Special Requests IN PEDIATRIC BOTTLE Blood Culture adequate volume    Culture      NO GROWTH 1 DAY Performed at  [facility] [address], [place], [place] [address]    Report Status PENDING   Culture, blood (routine x 2)     Status: None (Preliminary result)   Collection Time: 10/19/17  9:48 AM  Result Value Ref Range   Specimen Description BLOOD RIGHT HAND    Special Requests IN PEDIATRIC BOTTLE Blood Culture adequate volume    Culture      NO GROWTH 1 DAY Performed at [facility], [place] [address] [place], [place] [address]    Report Status PENDING   Glucose, capillary     Status: Abnormal   Collection Time: 10/19/17 11:16 AM  Result Value Ref Range   Glucose-Capillary 148 (H) 65 - 99 mg/dL  Hemoglobin and hematocrit, blood     Status: Abnormal   Collection Time: 10/19/17  2:58 PM  Result Value Ref Range   Hemoglobin 7.9 (L) 12.0 - 15.0 g/dL   HCT 24.4 (L) 36.0 - 46.0 %  Brain natriuretic peptide     Status: Abnormal   Collection Time: 10/19/17  2:58 PM  Result Value Ref Range   B Natriuretic Peptide 215.3 (H) 0.0 - 100.0 pg/mL  Glucose, capillary     Status: None   Collection Time: 10/19/17  4:43 PM  Result Value Ref Range   Glucose-Capillary 91 65 - 99 mg/dL   Comment 1 Notify RN    Comment 2 Document in Chart   Glucose, capillary     Status: Abnormal   Collection Time: 10/19/17  9:02 PM  Result Value Ref Range   Glucose-Capillary 114 (H) 65 - 99 mg/dL   Comment 1 Notify RN    Comment 2 Document in Chart   Heparin level (unfractionated)     Status: Abnormal   Collection Time: 10/20/17  1:49 AM  Result Value Ref Range   Heparin Unfractionated <0.10 (L) 0.30 - 0.70 IU/mL    Comment:        IF HEPARIN RESULTS ARE BELOW EXPECTED VALUES, AND PATIENT DOSAGE HAS BEEN CONFIRMED, SUGGEST FOLLOW UP TESTING OF ANTITHROMBIN III LEVELS.   Triglycerides     Status: None   Collection Time: 10/20/17  4:18 AM  Result Value Ref Range   Triglycerides 79 <150 mg/dL    Comment: Performed at [facility] [address] [place], [place] [address]  CBC     Status:  Abnormal   Collection Time: 10/20/17  4:18 AM  Result Value Ref Range   WBC 24.2 (H) 4.0 - 10.5 K/uL   RBC 2.70 (L) 3.87 - 5.11 MIL/uL   Hemoglobin 7.6 (L) 12.0 - 15.0 g/dL   HCT 23.3 (L) 36.0 - 46.0 %   MCV 86.3 78.0 - 100.0 fL   MCH 28.1 26.0 - 34.0 pg   MCHC 32.6 30.0 - 36.0 g/dL   RDW 17.2 (H) 11.5 - 15.5 %   Platelets 362 150 - 400 K/uL  Comprehensive metabolic panel     Status: Abnormal   Collection Time: 10/20/17  4:18 AM  Result Value Ref Range   Sodium 135 135 - 145 mmol/L   Potassium 3.4 (L) 3.5 - 5.1 mmol/L   Chloride 112 (H) 101 - 111 mmol/L   CO2 20 (L)  22 - 32 mmol/L   Glucose, Bld 109 (H) 65 - 99 mg/dL   BUN 10 6 - 20 mg/dL   Creatinine, Ser 0.85 0.44 - 1.00 mg/dL   Calcium 7.6 (L) 8.9 - 10.3 mg/dL   Total Protein 6.1 (L) 6.5 - 8.1 g/dL   Albumin 1.6 (L) 3.5 - 5.0 g/dL   AST 117 (H) 15 - 41 U/L   ALT 50 14 - 54 U/L   Alkaline Phosphatase 109 38 - 126 U/L   Total Bilirubin 0.5 0.3 - 1.2 mg/dL   GFR calc non Af Amer >60 >60 mL/min   GFR calc Af Amer >60 >60 mL/min    Comment: (NOTE) The eGFR has been calculated using the CKD EPI equation. This calculation has not been validated in all clinical situations. eGFR's persistently <60 mL/min signify possible Chronic Kidney Disease.    Anion gap 3 (L) 5 - 15  Magnesium     Status: None   Collection Time: 10/20/17  4:18 AM  Result Value Ref Range   Magnesium 2.4 1.7 - 2.4 mg/dL  Phosphorus     Status: None   Collection Time: 10/20/17  4:18 AM  Result Value Ref Range   Phosphorus 3.1 2.5 - 4.6 mg/dL  HIV antibody     Status: None   Collection Time: 10/20/17  4:18 AM  Result Value Ref Range   HIV Screen 4th Generation wRfx Non Reactive Non Reactive    Comment: (NOTE) Performed At: [facility] [place], [place] [id number] Rush Farmer MD [id number]   Hepatitis c antibody (reflex)     Status: None   Collection Time: 10/20/17  4:18 AM  Result Value Ref Range   HCV Ab 0.1 0.0 - 0.9  s/co ratio    Comment: (NOTE) Performed At: [facility] [place], [place] [id number] Rush Farmer MD [id number]   HCV Comment:     Status: None   Collection Time: 10/20/17  4:18 AM  Result Value Ref Range   Comment: Comment     Comment: (NOTE) Non reactive HCV antibody screen is consistent with no HCV infection, unless recent infection is suspected or other evidence exists to indicate HCV infection. Performed At: [facility] [place], [place] [id number] Rush Farmer MD SF:[id number]   Glucose, capillary     Status: None   Collection Time: 10/20/17  7:54 AM  Result Value Ref Range   Glucose-Capillary 97 65 - 99 mg/dL   Comment 1 Notify RN    Comment 2 Document in Chart   Vitamin B12     Status: Abnormal   Collection Time: 10/20/17  8:00 AM  Result Value Ref Range   Vitamin B-12 1,289 (H) 180 - 914 pg/mL    Comment: Performed at [facility], [place] [address] [place], [place] [address]  Iron and TIBC     Status: Abnormal   Collection Time: 10/20/17  8:00 AM  Result Value Ref Range   Iron 9 (L) 28 - 170 ug/dL   TIBC NOT CALCULATED 250 - 450 ug/dL    Comment: DUE TO TRFN <70   Saturation Ratios NOT CALCULATED 10.4 - 31.8 %    Comment: DUE TO TRFN <70   UIBC NOT CALCULATED ug/dL    Comment: DUE TO TRFN <70 Performed at [facility], [place] [address] [place], [place] [address]   Ferritin     Status: Abnormal   Collection Time: 10/20/17  8:00 AM  Result Value Ref Range   Ferritin 1,237 (H) 11 - 307 ng/mL    Comment: Performed at [facility], [place] [address] [place], [place] [address]  Folate     Status: None   Collection Time: 10/20/17  8:00 AM  Result Value Ref Range   Folate 8.9 >5.9 ng/mL    Comment: Performed at [facility], [place] [address] [place],  [address]  Reticulocytes     Status: Abnormal   Collection Time: 10/20/17  9:10 AM  Result Value Ref Range   Retic Ct Pct 1.3 0.4 - 3.1  %   RBC. 2.84 (L) 3.87 - 5.11 MIL/uL   Retic Count, Absolute 36.9 19.0 - 186.0 K/uL  Prepare RBC     Status: None   Collection Time: 10/20/17  9:10 AM  Result Value Ref Range   Order Confirmation ORDER PROCESSED BY BLOOD BANK   Type and screen [place]     Status: None (Preliminary result)   Collection Time: 10/20/17  9:10 AM  Result Value Ref Range   ABO/RH(D) O POS    Antibody Screen NEG    Sample Expiration 10/23/2017    Unit Number [id number]    Blood Component Type RED CELLS,LR    Unit division 00    Status of Unit ISSUED    Transfusion Status OK TO TRANSFUSE    Crossmatch Result Compatible   Occult blood card to lab, stool     Status: None   Collection Time: 10/20/17 10:32 AM  Result Value Ref Range   Fecal Occult Bld NEGATIVE NEGATIVE  Glucose, capillary     Status: None   Collection Time: 10/20/17 12:06 PM  Result Value Ref Range   Glucose-Capillary 88 65 - 99 mg/dL   Comment 1 Notify RN    Comment 2 Document in Chart   Heparin level (unfractionated)     Status: Abnormal   Collection Time: 10/20/17  1:06 PM  Result Value Ref Range   Heparin Unfractionated 0.12 (L) 0.30 - 0.70 IU/mL    Comment:        IF HEPARIN RESULTS ARE BELOW EXPECTED VALUES, AND PATIENT DOSAGE HAS BEEN CONFIRMED, SUGGEST FOLLOW UP TESTING OF ANTITHROMBIN III LEVELS.   Glucose, capillary     Status: Abnormal   Collection Time: 10/20/17  4:38 PM  Result Value Ref Range   Glucose-Capillary 102 (H) 65 - 99 mg/dL   Comment 1 Notify RN    Comment 2 Document in Chart     Dg Chest Port 1 View  Result Date: 10/18/2017 CLINICAL DATA:  PICC repositioning. EXAM: PORTABLE CHEST 1 VIEW COMPARISON:  Chest radiograph performed earlier today at 5:42 p.m. FINDINGS: The lungs are mildly hypoexpanded. Small bilateral pleural effusions are suspected. Vascular congestion is noted. Bilateral central airspace opacities may reflect pulmonary edema or possibly pneumonia. No pneumothorax  is seen. The cardiomediastinal silhouette is mildly enlarged. No acute osseous abnormalities are identified. The patient's left PICC is noted ending overlying the proximal SVC. Previously noted coiling has resolved. IMPRESSION: 1. Left PICC noted ending overlying the proximal SVC. Previously noted coiling has resolved. 2. Lungs mildly hypoexpanded. Vascular congestion and mild cardiomegaly. Bilateral central airspace opacities may reflect pulmonary edema or possibly pneumonia. Electronically Signed   By: Garald Balding M.D.   On: 10/18/2017 22:22    ROS Blood pressure (!) 103/48, pulse 98, temperature 98.9 F (37.2 C), temperature source Oral, resp. rate (!) 22, height  5' 4.5" (1.638 m), weight 121 lb 14.6 oz (55.3 kg), SpO2 100 %. Physical Exam  Vitals reviewed. Constitutional: She is oriented to person, place, and time. No distress.  Elderly frail appearing  HENT:  Head: Normocephalic and atraumatic.  Mouth/Throat: No oropharyngeal exudate.  Eyes: Conjunctivae are normal. No scleral icterus.  Neck: Neck supple. No thyromegaly present.  Cardiovascular: Normal rate, regular rhythm and normal heart sounds.  No murmur heard. Respiratory: Effort normal and breath sounds normal. No respiratory distress. She has no wheezes. She has no rales.  GI: Soft. She exhibits no distension. There is no tenderness.  Musculoskeletal: She exhibits no edema.  Lymphadenopathy:    She has no cervical adenopathy.  Neurological: She is alert and oriented to person, place, and time. No cranial nerve deficit.  Skin: Skin is warm and dry.    Assessment/Plan: 71 yo woman with persistent fevers and a fluid collection in the mediastinum 6 weeks out from repair of a paraesophageal hernia. IR is unable to drain the mediastinal fluid. There is concern for an infected collection although this may well just be postop seroma. I discussed doing a left VATS to drain the mediastinum with Danielle [name]. I informed her of the  general nature of the procedure, the incisions to be used, the use of drainage tubes. I advised her as to the indications, risks, benefits, and alternatives. She understands there is no guarantee this will resolve her infection. She understands the risks include but are not limited to death, MI, DVT, PE, bleeding, possible need for transfusion, infection, as well as the possibility of other unforeseeable complications.  She agrees to proceed  Will stop heparin drip at 0200  transport to [facility] OR in AM by 0600. Will defer to Hospitalists if they want to transfer her overnight.    Melrose Nakayama 10/20/2017, 7:18 PM

## 2017-10-20 NOTE — Progress Notes (Signed)
Subjective/Chief Complaint: PT with NAE overnight   Objective: Vital signs in last 24 hours: Temp:  [98.8 F (37.1 C)-102.9 F (39.4 C)] 99.6 F (37.6 C) (11/14 0400) Pulse Rate:  [88-110] 92 (11/14 0600) Resp:  [18-32] 27 (11/14 0600) BP: (99-126)/(47-58) 99/52 (11/14 0600) SpO2:  [99 %-100 %] 100 % (11/14 0600) Last BM Date: 10/19/17  Intake/Output from previous day: 11/13 0701 - 11/14 0700 In: 2592.9 [P.O.:480; I.V.:860.9; IV Piggyback:1252] Out: 1875 [Urine:1550; Stool:325] Intake/Output this shift: No intake/output data recorded.  General appearance: alert and cooperative GI: soft, non-tender; bowel sounds normal; no masses,  no organomegaly  Lab Results:  Recent Labs    10/19/17 0500 10/19/17 1458 10/20/17 0418  WBC 24.3*  --  24.2*  HGB 7.6* 7.9* 7.6*  HCT 22.9* 24.4* 23.3*  PLT 360  --  362   BMET Recent Labs    10/19/17 0500 10/20/17 0418  NA 136 135  K 2.9* 3.4*  CL 112* 112*  CO2 19* 20*  GLUCOSE 119* 109*  BUN 8 10  CREATININE 0.94 0.85  CALCIUM 7.2* 7.6*  Studies/Results: Dg Chest 1 View  Result Date: 10/18/2017 CLINICAL DATA:  PICC line placement EXAM: CHEST 1 VIEW COMPARISON:  10/18/2017 FINDINGS: Limited by positioning. Left upper extremity catheter tip is looped back upon itself over the brachiocephalic region. Small pleural effusions. Stable cardiomegaly with vascular congestion and bibasilar airspace disease. Hiatal hernia. IMPRESSION: 1. Left upper extremity catheter tip is folded back upon itself over the brachiocephalic region 2. Small bilateral pleural effusions with bibasilar atelectasis or pneumonia. 3. Cardiomegaly with vascular congestion Electronically Signed   By: Donavan Foil M.D.   On: 10/18/2017 19:56   Dg Chest Port 1 View  Result Date: 10/18/2017 CLINICAL DATA:  PICC repositioning. EXAM: PORTABLE CHEST 1 VIEW COMPARISON:  Chest radiograph performed earlier today at 5:42 p.m. FINDINGS: The lungs are mildly  hypoexpanded. Small bilateral pleural effusions are suspected. Vascular congestion is noted. Bilateral central airspace opacities may reflect pulmonary edema or possibly pneumonia. No pneumothorax is seen. The cardiomediastinal silhouette is mildly enlarged. No acute osseous abnormalities are identified. The patient's left PICC is noted ending overlying the proximal SVC. Previously noted coiling has resolved. IMPRESSION: 1. Left PICC noted ending overlying the proximal SVC. Previously noted coiling has resolved. 2. Lungs mildly hypoexpanded. Vascular congestion and mild cardiomegaly. Bilateral central airspace opacities may reflect pulmonary edema or possibly pneumonia. Electronically Signed   By: Garald Balding M.D.   On: 10/18/2017 22:22   Dg Chest Port 1 View  Result Date: 10/18/2017 CLINICAL DATA:  Status post left thoracentesis. EXAM: PORTABLE CHEST 1 VIEW COMPARISON:  10/29/2017 FINDINGS: There is decreased pleural fluid on the left following thoracentesis. Residual left lung base opacity is likely atelectasis. There is no pneumothorax. There is opacity at the right lung base mostly obscuring hemidiaphragm consistent with a small right pleural effusion, new/increased when compared to the prior exam. A left PICC has been placed since the prior exam. Tip extends into the brachiocephalic vein but curls back on itself. It is unclear based on this single rotated AP view with this remains in the superior vena cava or extends back into the brachiocephalic vein. IMPRESSION: 1. Decreased left pleural fluid following thoracentesis. No pneumothorax or evidence of a procedure complication. 2. Left PICC positioned as detailed above, tip curling back on itself. 3. Small right pleural effusion. Electronically Signed   By: Lajean Manes M.D.   On: 10/18/2017 16:36   Dg Esophagus  W/water Sol Cm  Result Date: 10/18/2017 CLINICAL DATA:  Nausea and vomiting.  Recent gastric surgery. EXAM: ESOPHOGRAM/BARIUM SWALLOW  TECHNIQUE: Single contrast examination was performed using water soluble contrast. The study was performed in the LPO semi-upright position. FLUOROSCOPY TIME:  Fluoroscopy Time:  1 minutes 18 seconds Radiation Exposure Index (if provided by the fluoroscopic device): 15.3 mGy COMPARISON:  CT scans dated 10/15/2017 and 10/05/2017 FINDINGS: The oropharyngeal swallowing mechanisms appear normal. There is a 2.5 cm area of circumferential narrowing of the distal esophagus just proximal to the gastric fundus. This is in the region of the mediastinal fluid collections seen on the prior CT scan. Water-soluble contrast flows freely through this area. Ms. Balcerzak tolerated the procedure well. IMPRESSION: 2.5 cm circumferential narrowing of the distal esophagus just above the gastric fundus. I suspect this is is due to a compression of the esophagus by the mediastinal fluid demonstrated on the prior CT scan. No obstruction. The patient was quite reluctant to drink the contrast. Electronically Signed   By: Lorriane Shire M.D.   On: 10/18/2017 09:31   US Thoracentesis Asp Pleural Space W/img Guide  Result Date: 10/18/2017 INDICATION: Patient with history of mediastinal abscess, Crohn's disease, left pleural effusion. Request made for diagnostic and therapeutic left thoracentesis. EXAM: ULTRASOUND GUIDED DIAGNOSTIC AND THERAPEUTIC LEFT THORACENTESIS MEDICATIONS: None. COMPLICATIONS: None immediate. PROCEDURE: An ultrasound guided thoracentesis was thoroughly discussed with the patient and questions answered. The benefits, risks, alternatives and complications were also discussed. The patient understands and wishes to proceed with the procedure. Written consent was obtained. Ultrasound was performed to localize and mark an adequate pocket of fluid in the left chest. The area was then prepped and draped in the normal sterile fashion. 1% Lidocaine was used for local anesthesia. Under ultrasound guidance a Safe-T-Centesis  catheter was introduced. Thoracentesis was performed. The catheter was removed and a dressing applied. FINDINGS: A total of approximately 780 cc of slightly hazy, blood-tinged fluid was removed. Samples were sent to the laboratory as requested by the clinical team. IMPRESSION: Successful ultrasound guided diagnostic and therapeutic left thoracentesis yielding 780 cc of pleural fluid. Read by: Rowe Robert, PA-C Electronically Signed   By: Markus Daft M.D.   On: 10/18/2017 16:35    Anti-infectives: Anti-infectives (From admission, onward)   Start     Dose/Rate Route Frequency Ordered Stop   10/17/17 0800  vancomycin (VANCOCIN) IVPB 750 mg/150 ml premix  Status:  Discontinued     750 mg 150 mL/hr over 60 Minutes Intravenous Every 24 hours 10/16/17 1220 10/17/17 1023   10/16/17 1800  anidulafungin (ERAXIS) 100 mg in sodium chloride 0.9 % 100 mL IVPB     100 mg 78 mL/hr over 100 Minutes Intravenous Every 24 hours 10/15/17 1657     10/16/17 0600  vancomycin (VANCOCIN) IVPB 1000 mg/200 mL premix  Status:  Discontinued     1,000 mg 200 mL/hr over 60 Minutes Intravenous Every 36 hours 10/20/2017 2308 10/16/17 1220   10/15/17 1800  anidulafungin (ERAXIS) 200 mg in sodium chloride 0.9 % 200 mL IVPB     200 mg 78 mL/hr over 200 Minutes Intravenous  Once 10/15/17 1657 10/15/17 2148   10/18/2017 2330  piperacillin-tazobactam (ZOSYN) IVPB 3.375 g  Status:  Discontinued     3.375 g 100 mL/hr over 30 Minutes Intravenous Every 8 hours 11/01/2017 2257 10/19/2017 2259   11/03/2017 2315  piperacillin-tazobactam (ZOSYN) IVPB 3.375 g     3.375 g 12.5 mL/hr over 240 Minutes Intravenous Every  8 hours 10/24/2017 2300     11/04/2017 1930  vancomycin (VANCOCIN) IVPB 1000 mg/200 mL premix     1,000 mg 200 mL/hr over 60 Minutes Intravenous  Once 10/12/2017 1915 10/23/2017 2044   11/02/2017 1530  piperacillin-tazobactam (ZOSYN) IVPB 3.375 g     3.375 g 100 mL/hr over 30 Minutes Intravenous  Once 10/25/2017 1529 11/02/2017 1604       Assessment/Plan: -ADv diet to FLD.  Pt appears to be tol Clears well after IR proc -Pt agrees for CT Chest today -Encourage PT -abx -TNA   LOS: 6 days    [name]., [facility] 10/20/2017

## 2017-10-20 NOTE — Progress Notes (Signed)
[place] NOTE   Pharmacy Consult for TPN Indication: poor po intake, unable to place repeat Gtube  Patient Measurements: Height: 5' 4.5" (163.8 cm) Weight: 123 lb 3.8 oz (55.9 kg) IBW/kg (Calculated) : 55.85 TPN AdjBW (KG): 59 Body mass index is 20.83 kg/m.  Central access: helding PICC placement to r/o no bacteremia, PICC placed 11/11 TPN start date: 11/11  ASSESSMENT                                                                                                          HPI: 43 yoF re-admit 11/8 with N/V, loose stools (baseline w/Crohn's). Recurrent mediastinal fluid abscess, possible splenic abscess on CT PMH: Crohns, HH, hx SBO Significant events: 10/3 - 10/21 admit for [facility] repair, ICU post-op on vent & pressors, extubated POD2, was on TPN, pelvic fluid collection-C albicans & C glabrata tx with Eraxis & Ertapenem x 2 wk total 10/26 admit with fevers, tx with Zosyn  Significant events:  11/9: repletion of electrolytes Magnesium 2gm bolus, Phosphorus, KCl, refusing Bicarb tablets 11/10: Boost tid, electrolyte repletion, Mag 4gm bolus, K Phos 20 mMol bolus, KCL runs x2 11/11: PICC placed, no am labs to aid in electrolyte repletion  Today, 10/20/17  Glucose - CBGs continue be at goal of <150  Electrolytes - at risk for refeeding syndrome - K 3.4 just below normal range this AM and mag/phos improved to WNL. K replacement orders per Md already done  Renal - SCr 0.94 stable  LFTs - AST 117 and ALT 50 rising  TGs - 444 (11/12), 104 (11/13), 79 (11/14)  Prealbumin - <5 (11/12)  Insulin Requirements: none  Current Nutrition: CCS advanced to FL diet but patient not eating much yet per RD  IVF: Sodium Bicarb infusion at 50 ml/hr - d/c'd 11/13 AM   NUTRITIONAL GOALS                                                                                             RD recs: 60-70 gm protein, 1475-1170 kCal per 24 hr  Clinimix 5/15 at a goal  rate of 55 ml/hr + 20% fat emulsion at 48ml/hr to provide: 66 g/day protein, 1465 Kcal/day.  PLAN  At 1800:  Continue Clinimix E 5/15 and will increase to goal rate of 55 ml/hr  Patient changed to step down status 11/13 and with TGs being normal now will start IV lipids at 20 ml/hr over 12 hrs (about 34% of total Kcal so will monitor closely since above usual max of 30%)  Plan to advance as tolerated to the goal rate.  TPN to contain standard multivitamins and trace elements.  Continue current SSI/CBGs checks  TPN lab panels on Mondays & Thursdays.  LFTs rising - at goal rate, dextrose rate per TPN only 2.4 mg/kg/min so patient not getting too much dextrose. Lipid    Adrian Saran, PharmD, BCPS Pager [phone] 10/20/2017 10:03 AM

## 2017-10-20 NOTE — Progress Notes (Signed)
PT Cancellation Note  Patient Details Name: Danielle [name] MRN: [id number] DOB: 1946-08-23   Cancelled Treatment:    Reason Eval/Treat Not Completed: Patient declined, no reason specified. Wants to get up tomorrow. Dizzy with attempts by nursing.    Marcelino Freestone PT [phone]  10/20/2017, 4:49 PM

## 2017-10-20 NOTE — Progress Notes (Signed)
[place] for heparin Indication: bridge therapy while Eliquis for RUE DVT on hold  Allergies  Allergen Reactions  . Zithromax [Azithromycin] Swelling and Other (See Comments)    Mouth swelling and blisters   . Aspirin Other (See Comments)    Stomach cramping   . Ibuprofen Other (See Comments)    Pt states that she prefers not to take this medication.   . Iron Nausea And Vomiting and Other (See Comments)    FERROUS SULFATE  . Sulfa Antibiotics Nausea And Vomiting    Patient Measurements: Height: 5' 4.5" (163.8 cm) Weight: 123 lb 3.8 oz (55.9 kg) IBW/kg (Calculated) : 55.85 Heparin Dosing Weight: 55.9 kg  Vital Signs: Temp: 101.5 F (38.6 C) (11/14 1320) Temp Source: Oral (11/14 1320) BP: 93/39 (11/14 1320) Pulse Rate: 105 (11/14 1320)  Labs: Recent Labs    10/18/17 0542 10/19/17 0500 10/19/17 1458 10/20/17 0149 10/20/17 0418 10/20/17 1306  HGB 8.0* 7.6* 7.9*  --  7.6*  --   HCT 24.4* 22.9* 24.4*  --  23.3*  --   PLT 384 360  --   --  362  --   HEPARINUNFRC  --   --   --  <0.10*  --  0.12*  CREATININE 0.97 0.94  --   --  0.85  --     Estimated Creatinine Clearance: 55.1 mL/min (by C-G formula based on SCr of 0.85 mg/dL).   Medical History: Past Medical History:  Diagnosis Date  . Crohn disease ([place])   . Hiatal hernia 12/07/2015  . Left buttock abscess 04/21/2017  . Psoriasis   . SBO (small bowel obstruction) ([place]) 07/27/2017    Medications:  Medications Prior to Admission  Medication Sig Dispense Refill Last Dose  . albuterol (PROVENTIL HFA;VENTOLIN HFA) 108 (90 Base) MCG/ACT inhaler Inhale 2 puffs into the lungs every 6 (six) hours as needed for wheezing. 1 Inhaler 2 more than 30 days  . apixaban (ELIQUIS) 5 MG TABS tablet Take 10 mg twice a day for 7 days and then start using 5 mg BID. 60 tablet 3 10/13/2017 at 1800  . cyanocobalamin (,VITAMIN B-12,) 1000 MCG/ML injection INJECT 1000 MCG IM ONCE MONTHLY 1 mL 11  09/22/2017  . dicyclomine (BENTYL) 10 MG/5ML syrup TAKE 10 MLS (20 MG TOTAL) BY MOUTH 3 (THREE) TIMES DAILY AS NEEDED. (Patient taking differently: Take 20 mg 3 (three) times daily as needed by mouth (stomach cramps). ) 300 mL 3 unknown  . diphenoxylate-atropine (LOMOTIL) 2.5-0.025 MG tablet TAKE 1 TABLET BY MOUTH FOUR TIMES A DAY AS NEEDED (Patient taking differently: Take one tablet twice daily.) 90 tablet 0 10/13/2017 at Unknown time  . feeding supplement (BOOST / RESOURCE BREEZE) LIQD Take 1 Container by mouth 3 (three) times daily between meals.     . furosemide (LASIX) 20 MG tablet Take 1 tablet (20 mg total) by mouth daily as needed (Take for weight gain of more than 2 pounds in a day or 5 pounds in 2 days.). 30 tablet 0 unknown  . HYDROcodone-acetaminophen (NORCO) 7.5-325 MG tablet Take 1 tablet by mouth every 6 (six) hours as needed for moderate pain or severe pain. 30 tablet 0 10/13/2017 at Unknown time  . magnesium oxide (MAG-OX) 400 (241.3 Mg) MG tablet Take 1 tablet (400 mg total) by mouth daily. 30 tablet 0 10/13/2017 at Unknown time  . nystatin (NYSTATIN) powder Apply 1 g topically 2 (two) times daily as needed (for itching/irritation).   unknown  .  pantoprazole (PROTONIX) 40 MG tablet Take 1 tablet (40 mg total) by mouth daily.   10/13/2017 at Unknown time  . prochlorperazine (COMPAZINE) 10 MG tablet Take 1 tablet (10 mg total) by mouth every 6 (six) hours as needed for refractory nausea / vomiting. 30 tablet 0 10/13/2017 at Unknown time  . sodium bicarbonate 650 MG tablet Take 650 mg by mouth 2 (two) times daily.   10/13/2017 at Unknown time  . spironolactone (ALDACTONE) 50 MG tablet Take 50 mg by mouth daily.    10/13/2017 at Unknown time  . acetaminophen (TYLENOL) 325 MG tablet Take 2 tablets (650 mg total) by mouth every 6 (six) hours as needed for mild pain or moderate pain. (Patient not taking: Reported on 10/22/2017) 40 tablet 0 Not Taking at Unknown time  . promethazine (PHENERGAN) 25 MG  tablet TAKE 1 TABLET (25 MG TOTAL) BY MOUTH 2 (TWO) TIMES DAILY AS NEEDED FOR NAUSEA OR VOMITING. (Patient not taking: Reported on 10/11/2017) 20 tablet 0 Not Taking at Unknown time    Assessment: 71 yo F on Eliquis PTA for DVT in RUE.  Last dose of Eliquis 10 mg po bid 11/7 at 1800.  She was due to start Eliquis 5 mg po BID on 11/8, but it has been on hold since admitted to hospital.   Pharmacy consulted to dose heparin for bridge therapy while Eliquis on hold in the ICU setting.  Hg 7.9, pltc WNL, Scr 0.94, wt 55.9 kg.  Transfused 11/9- for Hg 6.8.  S/p thoracentesis 11/12.   10/20/2017:  Heparin level subtherapeutic still after rebolus of 2000 units and increase in rate to 1000 units/hr earlier this AM  No bleeding or infusion related issues reported by RN  Hgb low but stable, Plts stable  Goal of Therapy:  Heparin level 0.3-0.7 units/ml Monitor platelets by anticoagulation protocol: Yes   Plan:  1) Increase IV heparin rate from 1000 units/hr to 1400 units/hr per [place] nomogram 2) Recheck heparin level 8 hours after rate increase 3) Daily CBC and heparin level   Adrian Saran, PharmD, BCPS Pager [phone] 10/20/2017 1:38 PM

## 2017-10-20 NOTE — Progress Notes (Signed)
[place] for heparin Indication: bridge therapy while Eliquis for RUE DVT on hold  Allergies  Allergen Reactions  . Zithromax [Azithromycin] Swelling and Other (See Comments)    Mouth swelling and blisters   . Aspirin Other (See Comments)    Stomach cramping   . Ibuprofen Other (See Comments)    Pt states that she prefers not to take this medication.   . Iron Nausea And Vomiting and Other (See Comments)    FERROUS SULFATE  . Sulfa Antibiotics Nausea And Vomiting    Patient Measurements: Height: 5' 4.5" (163.8 cm) Weight: 121 lb 14.6 oz (55.3 kg) IBW/kg (Calculated) : 55.85 Heparin Dosing Weight: 55.9 kg  Vital Signs: Temp: 101.4 F (38.6 C) (11/14 2300) Temp Source: Oral (11/14 2300) BP: 124/44 (11/14 2300) Pulse Rate: 107 (11/14 2300)  Labs: Recent Labs    10/18/17 0542 10/19/17 0500 10/19/17 1458 10/20/17 0149 10/20/17 0418 10/20/17 1306 10/20/17 2206  HGB 8.0* 7.6* 7.9*  --  7.6*  --   --   HCT 24.4* 22.9* 24.4*  --  23.3*  --   --   PLT 384 360  --   --  362  --   --   HEPARINUNFRC  --   --   --  <0.10*  --  0.12* 0.21*  CREATININE 0.97 0.94  --   --  0.85  --   --     Estimated Creatinine Clearance: 54.5 mL/min (by C-G formula based on SCr of 0.85 mg/dL).   Medical History: Past Medical History:  Diagnosis Date  . Crohn disease ([place])   . Hiatal hernia 12/07/2015  . Left buttock abscess 04/21/2017  . Psoriasis   . SBO (small bowel obstruction) ([place]) 07/27/2017    Medications:  Medications Prior to Admission  Medication Sig Dispense Refill Last Dose  . albuterol (PROVENTIL HFA;VENTOLIN HFA) 108 (90 Base) MCG/ACT inhaler Inhale 2 puffs into the lungs every 6 (six) hours as needed for wheezing. 1 Inhaler 2 more than 30 days  . apixaban (ELIQUIS) 5 MG TABS tablet Take 10 mg twice a day for 7 days and then start using 5 mg BID. 60 tablet 3 10/13/2017 at 1800  . cyanocobalamin (,VITAMIN B-12,) 1000 MCG/ML injection  INJECT 1000 MCG IM ONCE MONTHLY 1 mL 11 09/22/2017  . dicyclomine (BENTYL) 10 MG/5ML syrup TAKE 10 MLS (20 MG TOTAL) BY MOUTH 3 (THREE) TIMES DAILY AS NEEDED. (Patient taking differently: Take 20 mg 3 (three) times daily as needed by mouth (stomach cramps). ) 300 mL 3 unknown  . diphenoxylate-atropine (LOMOTIL) 2.5-0.025 MG tablet TAKE 1 TABLET BY MOUTH FOUR TIMES A DAY AS NEEDED (Patient taking differently: Take one tablet twice daily.) 90 tablet 0 10/13/2017 at Unknown time  . feeding supplement (BOOST / RESOURCE BREEZE) LIQD Take 1 Container by mouth 3 (three) times daily between meals.     . furosemide (LASIX) 20 MG tablet Take 1 tablet (20 mg total) by mouth daily as needed (Take for weight gain of more than 2 pounds in a day or 5 pounds in 2 days.). 30 tablet 0 unknown  . HYDROcodone-acetaminophen (NORCO) 7.5-325 MG tablet Take 1 tablet by mouth every 6 (six) hours as needed for moderate pain or severe pain. 30 tablet 0 10/13/2017 at Unknown time  . magnesium oxide (MAG-OX) 400 (241.3 Mg) MG tablet Take 1 tablet (400 mg total) by mouth daily. 30 tablet 0 10/13/2017 at Unknown time  . nystatin (NYSTATIN) powder Apply  1 g topically 2 (two) times daily as needed (for itching/irritation).   unknown  . pantoprazole (PROTONIX) 40 MG tablet Take 1 tablet (40 mg total) by mouth daily.   10/13/2017 at Unknown time  . prochlorperazine (COMPAZINE) 10 MG tablet Take 1 tablet (10 mg total) by mouth every 6 (six) hours as needed for refractory nausea / vomiting. 30 tablet 0 10/13/2017 at Unknown time  . sodium bicarbonate 650 MG tablet Take 650 mg by mouth 2 (two) times daily.   10/13/2017 at Unknown time  . spironolactone (ALDACTONE) 50 MG tablet Take 50 mg by mouth daily.    10/13/2017 at Unknown time  . acetaminophen (TYLENOL) 325 MG tablet Take 2 tablets (650 mg total) by mouth every 6 (six) hours as needed for mild pain or moderate pain. (Patient not taking: Reported on 10/18/2017) 40 tablet 0 Not Taking at Unknown  time  . promethazine (PHENERGAN) 25 MG tablet TAKE 1 TABLET (25 MG TOTAL) BY MOUTH 2 (TWO) TIMES DAILY AS NEEDED FOR NAUSEA OR VOMITING. (Patient not taking: Reported on 10/17/2017) 20 tablet 0 Not Taking at Unknown time    Assessment: 71 yo F on Eliquis PTA for DVT in RUE.  Last dose of Eliquis 10 mg po bid 11/7 at 1800.  She was due to start Eliquis 5 mg po BID on 11/8, but it has been on hold since admitted to hospital.   Pharmacy consulted to dose heparin for bridge therapy while Eliquis on hold in the ICU setting.  Hg 7.9, pltc WNL, Scr 0.94, wt 55.9 kg.  Transfused 11/9- for Hg 6.8.  S/p thoracentesis 11/12.   10/20/2017:  Heparin level subtherapeutic still after increase to 1400 units/hr (0.21 units/ml)  No bleeding or infusion related issues reported by RN  Hgb low but stable, Plts stable  Goal of Therapy:  Heparin level 0.3-0.7 units/ml Monitor platelets by anticoagulation protocol: Yes   Plan:  1) Increase IV heparin rate to 1600 units/hr 2) Drip is to be cut off at 0200 for VATS at [facility] 3) f/u anticoagulation post procedure   Dorrene German 10/20/2017 11:37 PM

## 2017-10-20 NOTE — Progress Notes (Signed)
PROGRESS NOTE    Danielle [name]  PFX:[id number] DOB: 15-May-1946 DOA: 11/03/2017 PCP: Iona Beard, MD   Brief Narrative:  Danielle [name] is a 72 y.o. BF PMHx Crohn's disease, hiatal hernia, LEFT buttock abscess, psoriasis, SBO, recently admitted in the discharge twice in the past 5 weeks, first from 09/08/2017 to 09/26/2017 after undergoing type IV hiatal hernia repair.  Postoperatively the patient required endotracheal intubation, mechanical ventilation and the use of pressors.  She was extubated on postop day #2.  She improved clinically and was on parenteral nutrition, however she developed a pelvic fluid collection which require a drain placement.  On 09/11/2017 the patient blood cultures were positive for Candida glabrata and abscess culture were positive for C. Albicans and C. Glabrata. She was given treatment with parenteral antifungals. She subsequently improved was able to ambulate with physical therapy, weaned off of parenteral nutrition and was tolerating oral diet. ID recommended to continue Invanz and Erasis for 2 weeks. A PICC line was placed in her right upper arm and she was subsequently discharged home.   She returned on 10/01/2017 due to intermittent fevers.  She was placed on the neck slowly and cultures were obtained.  Antibiotics were discontinue 24 hours prior to discharge and the patient remained afebrile and clinically improved.  However, she returns today with complaints of persistent nausea associated with about 3 episodes of emesis daily.  She has also had loose stools, but stated that this is her baseline since she suffers from Crohn's disease. She denies fever, chills, rhinorrhea, sore throat, productive cough, dyspnea, chest pain, palpitations, dizziness, diaphoresis, PND, orthopnea or pitting edema of the lower extremities. She denies constipation, melena or hematochezia.  She denies dysuria, frequency or hematuria.  Denies polyuria, polydipsia or blurred  vision.    Subjective: 11/14 A/O 4, negative CP, negative SOB. Negative abdominal pain, negative N/V. Negative Rigors. Patient's only concern is that she not be discharged again prior to being able to stay at hospital. Has had multiple recent hospitalizations and is therefore anxious. MAXIMUM TEMPERATURE last 24 hours 39.4C.     Assessment & Plan:   Principal Problem:   Septic shock ([place]) Active Problems:   Leukocytosis   Perianal Crohn's disease, with fistula ([place])   Acute renal failure superimposed on stage 3 chronic kidney disease ([place])   Incarcerated hiatal hernia s/p reduction 09/08/2017   Immunosuppressed status ([facility])   Hypotension   Nausea and vomiting   S/P thoracentesis   Anasarca   DVT (deep venous thrombosis) ([facility])   Protein-calorie malnutrition, moderate ([facility])   Mediastinal abscess ([facility])   Mediastinal post-op seromas   Pleural effusion, left   Patient's noncompliance with intervention / medical advice   Severe Sepsis/Mediastinal abscess -CT with reaccumulation of fluid collection in the lower mediastinum. Esophagram showing 2.5 cm area of extrinsic compression upon the esophagus. Possible smaller abscesses posterior margin spleen See results below -11/12 S/P LEFT Thoracentesis: 780 mL fluid removed: Exudative, Gram stain with WBCs and PMNs -Per Dr. Zenovia Jarred GI note from 11/9 he reviewed CT scan abdomen pelvis discussed with Dr. Ardis Hughs both agree mediastinal abscess complex not amenable to endoscopic drainage. Recommended cardiothoracic surgery be consulted. -IR consulted: Thoracentesis completed. Per review of EMR not candidate for percutaneous drain placement. If drain felt necessary consider tertiary care center. -Spoke with RN [facility] Cardiothoracic Surgery who will relay consult request to Dr. Roxan Hockey when he gets out of surgery later today. Per RN Karena Addison Dr. Florene Glen had spoken a few days  ago to Dr. Roxan Hockey concerning case, will await further  recommendations  Pleural effusion -Repeat PCXR 11/15   Fevers:  -Patient continues to spike fevers -Blood culture NGTD. -Urine culture pending   Decreased PO intake  Nausea Vomiting:   -Negative reported N/V today -TPA started per surgery: May need TPA discharge -Esophagram negative for obstruction but does show 2.5 cm circumferential narrowing of distal esophagus which is consistent with extrinsic pressure .     Pyuria:  -11/14 Urine culture pending since patient continues to spike fevers.    Anemia:  -Occult blood pending -Anemia panel pending  Chronic diastolic CHF -Strict in and out -Daily weight -Secondary to patient's relative hypotension hold all BP medication. -Transfuse for hemoglobin<8 -11/14 transfuse 1 unit PRBC  Essential HTN -See CHF  Pulmonary HTN  Hypotension/Tachycardia -Secondary to sepsis. Treat underlying problem - MAP goal>65 -D5-0.9% saline at 75 ml/hr    Diarrhea:  -C. difficile negative, GI pathogen panel negative -Lomotil -Rectal stool in place  RUE DVT:  -Eliquis on hold  -Currently on heparin drip however if patient's hemoglobin continues to drop or positive occult blood will DC      History of crohns   Hypokalemia -Potassium goal> 4 -Potassium 50 mEq PO    DVT prophylaxis: SCD & Heparin drip Code Status: Full Family Communication: None Disposition Plan: Dr. Roxan Hockey cardiothoracic surgery to see patient in a.m. if patient agrees will be transferred to [facility] for placement of drains, to drain mediastinal abscess    Consultants:  General Surgery GI ID IR   Procedures/Significant Events:  11/8 CT abdomen and pelvis W contrast:-Reaccumulation of the previously demonstrated fluid collection in the lower mediastinum, presumably abscess, likely multiloculated, surrounding the herniated portion of the stomach and lower esophagus, measuring 8 x 3 cm (transverse by AP dimensions), craniocaudal extent incompletely  imaged. -Now similar to the size seen on CT abdomen of 09/16/2017, at which point there were drainage catheters in place. -Thickening of the walls of the herniated portion of the stomach and the overlying esophagus, incompletely imaged, likely reactive to the adjacent fluid collection/abscess. -Bibasilar pleural effusions, LEFT>> RIGHT incompletely imaged, -Small complex collection along the posterior margin spleen, possibly additional small abscess, measuring 3.3 x 1.3 cm.-negative SBO. 11/11 PICC 11/12 Thoracentesis: 786 cc cc blood-tinged fluid 11/12 Esophagram/Barium swallow:-The oropharyngeal swallowing mechanisms appear normal. -2.5 cm area of circumferential narrowing of the distal esophagus just proximal to the gastric fundus. This is in the region of the mediastinal fluid collections seen on the prior CT scan.      I have personally reviewed and interpreted all radiology studies and my findings are as above.  VENTILATOR SETTINGS:    Cultures 11/8 blood NGTD 11/12 LEFT pleural fluid NGTD 11/13 blood pending 11/14 urine pending     Antimicrobials: Anti-infectives (From admission, onward)   Start     Stop   10/17/17 0800  vancomycin (VANCOCIN) IVPB 750 mg/150 ml premix  Status:  Discontinued     10/17/17 1023   10/16/17 1800  anidulafungin (ERAXIS) 100 mg in sodium chloride 0.9 % 100 mL IVPB         10/16/17 0600  vancomycin (VANCOCIN) IVPB 1000 mg/200 mL premix  Status:  Discontinued     10/16/17 1220   10/15/17 1800  anidulafungin (ERAXIS) 200 mg in sodium chloride 0.9 % 200 mL IVPB     10/15/17 2148   11/01/2017 2330  piperacillin-tazobactam (ZOSYN) IVPB 3.375 g  Status:  Discontinued  10/18/2017 2259   10/27/2017 2315  piperacillin-tazobactam (ZOSYN) IVPB 3.375 g         10/16/2017 1930  vancomycin (VANCOCIN) IVPB 1000 mg/200 mL premix     10/19/2017 2044   11/04/2017 1530  piperacillin-tazobactam (ZOSYN) IVPB 3.375 g     10/28/2017 1604       Devices    LINES /  TUBES:  RIGHT PICC 11/11>>    Continuous Infusions: . anidulafungin Stopped (10/19/17 1913)  . heparin 1,000 Units/hr (10/20/17 0600)  . piperacillin-tazobactam (ZOSYN)  IV 3.375 g (10/20/17 2025)  . Marland KitchenTPN (CLINIMIX-E) Adult 40 mL/hr at 10/20/17 0600     Objective: Vitals:   10/20/17 0300 10/20/17 0400 10/20/17 0500 10/20/17 0600  BP:  (!) 99/52  (!) 99/52  Pulse: 88 90 93 92  Resp: (!) 23 (!) 23 (!) 25 (!) 27  Temp:  99.6 F (37.6 C)    TempSrc:  Oral    SpO2: 100% 100% 100% 100%  Weight:      Height:        Intake/Output Summary (Last 24 hours) at 10/20/2017 0732 Last data filed at 10/20/2017 0600 Gross per 24 hour  Intake 2552.9 ml  Output 1875 ml  Net 677.9 ml   Filed Weights   10/11/2017 1248 10/15/17 1449  Weight: 130 lb (59 kg) 123 lb 3.8 oz (55.9 kg)    Examination:  General: A/O 4, No acute respiratory distress, cachectic Neck:  Negative scars, masses, torticollis, lymphadenopathy, JVD Lungs: Clear to auscultation bilaterally except for decreased breath sounds in RLL, without wheezes or crackles Cardiovascular: Regular rate and rhythm without murmur gallop or rub normal S1 and S2 Abdomen: negative abdominal pain, nondistended, positive soft, bowel sounds, no rebound, no ascites, no appreciable mass Extremities: No significant cyanosis, clubbing, or edema bilateral lower extremities Skin: Negative rashes, lesions, ulcers Psychiatric:  Negative depression, positive anxiety, negative fatigue, negative mania  Central nervous system:  Cranial nerves II through XII intact, tongue/uvula midline, all extremities muscle strength 5/5, sensation intact throughout, negative dysarthria, negative expressive aphasia, negative receptive aphasia.  .     Data Reviewed: Care during the described time interval was provided by me .  I have reviewed this patient's available data, including medical history, events of note, physical examination, and all test results as part of  my evaluation.   CBC: Recent Labs  Lab 10/27/2017 1405 10/15/17 0400  10/16/17 1346 10/17/17 1126 10/18/17 0542 10/19/17 0500 10/19/17 1458 10/20/17 0418  WBC 23.0* 22.7*  --  29.9* 28.7* 24.1* 24.3*  --  24.2*  NEUTROABS 14.3* 17.0*  --   --  18.6* 15.7*  --   --   --   HGB 12.3 8.2*   < > 9.2* 8.6* 8.0* 7.6* 7.9* 7.6*  HCT 37.5 25.3*   < > 28.1* 26.2* 24.4* 22.9* 24.4* 23.3*  MCV 85.2 86.6  --  86.2 86.2 89.4 86.4  --  86.3  PLT 276 359  --  370 444* 384 360  --  362   < > = values in this interval not displayed.   Basic Metabolic Panel: Recent Labs  Lab 10/16/17 1346 10/17/17 1126 10/18/17 0542 10/19/17 0500 10/20/17 0418  NA 135 134* 132* 136 135  K 3.6 4.4 4.0 2.9* 3.4*  CL 115* 114* 110 112* 112*  CO2 10* 13* 16* 19* 20*  GLUCOSE 75 75 444* 119* 109*  BUN 6 5* 7 8 10   CREATININE 1.03* 1.01* 0.97 0.94 0.85  CALCIUM  7.6* 7.4* 7.4* 7.2* 7.6*  MG 1.4* 2.0 2.0 1.4* 2.4  PHOS 2.1* 2.5 3.5 2.3* 3.1   GFR: Estimated Creatinine Clearance: 55.1 mL/min (by C-G formula based on SCr of 0.85 mg/dL). Liver Function Tests: Recent Labs  Lab 10/16/17 1346 10/17/17 1126 10/18/17 0542 10/19/17 0500 10/20/17 0418  AST 23 23 20  45* 117*  ALT 16 14 12* 20 50  ALKPHOS 72 71 60 77 109  BILITOT 0.9 1.0 0.3 0.8 0.5  PROT 6.4* 6.3* 5.7* 5.7* 6.1*  ALBUMIN 1.9* 1.8* 1.6* 1.6* 1.6*   Recent Labs  Lab 10/19/2017 1405  LIPASE 34   No results for input(s): AMMONIA in the last 168 hours. Coagulation Profile: Recent Labs  Lab 10/16/2017 1405 10/15/17 0400  INR 1.69 1.52   Cardiac Enzymes: No results for input(s): CKTOTAL, CKMB, CKMBINDEX, TROPONINI in the last 168 hours. BNP (last 3 results) No results for input(s): PROBNP in the last 8760 hours. HbA1C: No results for input(s): HGBA1C in the last 72 hours. CBG: Recent Labs  Lab 10/18/17 2106 10/19/17 0730 10/19/17 1116 10/19/17 1643 10/19/17 2102  GLUCAP 111* 108* 148* 91 114*   Lipid Profile: Recent Labs     10/18/17 0542 10/19/17 0500  TRIG 444* 104   Thyroid Function Tests: No results for input(s): TSH, T4TOTAL, FREET4, T3FREE, THYROIDAB in the last 72 hours. Anemia Panel: Recent Labs    10/18/17 1138  VITAMINB12 1,171*  FOLATE 10.1  FERRITIN 843*  TIBC NOT CALCULATED  IRON 6*   Urine analysis:    Component Value Date/Time   COLORURINE YELLOW 10/23/2017 1501   APPEARANCEUR HAZY (A) 11/02/2017 1501   LABSPEC 1.014 10/22/2017 1501   PHURINE 6.0 10/18/2017 1501   GLUCOSEU NEGATIVE 10/17/2017 1501   HGBUR MODERATE (A) 10/27/2017 1501   BILIRUBINUR NEGATIVE 10/31/2017 1501   KETONESUR 5 (A) 10/08/2017 1501   PROTEINUR 30 (A) 10/07/2017 1501   UROBILINOGEN 0.2 07/29/2015 2231   NITRITE NEGATIVE 11/04/2017 1501   LEUKOCYTESUR LARGE (A) 10/10/2017 1501   Sepsis Labs: @LABRCNTIP (procalcitonin:4,lacticidven:4)  ) Recent Results (from the past 240 hour(s))  Culture, blood (Routine x 2)     Status: None   Collection Time: 10/23/2017  2:05 PM  Result Value Ref Range Status   Specimen Description BLOOD LEFT FOREARM  Final   Special Requests   Final    BOTTLES DRAWN AEROBIC AND ANAEROBIC Blood Culture results may not be optimal due to an inadequate volume of blood received in culture bottles   Culture   Final    NO GROWTH 5 DAYS Performed at [facility], [place] [address] [place], [place] [address]    Report Status 10/19/2017 FINAL  Final  MRSA PCR Screening     Status: None   Collection Time: 10/24/2017 10:45 PM  Result Value Ref Range Status   MRSA by PCR NEGATIVE NEGATIVE Final    Comment:        The GeneXpert MRSA Assay (FDA approved for NASAL specimens only), is one component of a comprehensive MRSA colonization surveillance program. It is not intended to diagnose MRSA infection nor to guide or monitor treatment for MRSA infections.   Culture, blood (Routine x 2)     Status: None (Preliminary result)   Collection Time: 10/13/2017 11:28 PM  Result Value Ref Range  Status   Specimen Description BLOOD LEFT HAND  Final   Special Requests IN PEDIATRIC BOTTLE Blood Culture adequate volume  Final   Culture   Final  NO GROWTH 4 DAYS Performed at [facility], [place] [address] [place], [place] [address]    Report Status PENDING  Incomplete  C difficile quick scan w PCR reflex     Status: None   Collection Time: 10/16/17  2:47 PM  Result Value Ref Range Status   C Diff antigen NEGATIVE NEGATIVE Final   C Diff toxin NEGATIVE NEGATIVE Final   C Diff interpretation No C. difficile detected.  Final  Gastrointestinal Panel by PCR , Stool     Status: None   Collection Time: 10/16/17  2:48 PM  Result Value Ref Range Status   Campylobacter species NOT DETECTED NOT DETECTED Final   Plesimonas shigelloides NOT DETECTED NOT DETECTED Final   Salmonella species NOT DETECTED NOT DETECTED Final   Yersinia enterocolitica NOT DETECTED NOT DETECTED Final   Vibrio species NOT DETECTED NOT DETECTED Final   Vibrio cholerae NOT DETECTED NOT DETECTED Final   Enteroaggregative E coli (EAEC) NOT DETECTED NOT DETECTED Final   Enteropathogenic E coli (EPEC) NOT DETECTED NOT DETECTED Final   Enterotoxigenic E coli (ETEC) NOT DETECTED NOT DETECTED Final   Shiga like toxin producing E coli (STEC) NOT DETECTED NOT DETECTED Final   Shigella/Enteroinvasive E coli (EIEC) NOT DETECTED NOT DETECTED Final   Cryptosporidium NOT DETECTED NOT DETECTED Final   Cyclospora cayetanensis NOT DETECTED NOT DETECTED Final   Entamoeba histolytica NOT DETECTED NOT DETECTED Final   Giardia lamblia NOT DETECTED NOT DETECTED Final   Adenovirus F40/41 NOT DETECTED NOT DETECTED Final   Astrovirus NOT DETECTED NOT DETECTED Final   Norovirus GI/GII NOT DETECTED NOT DETECTED Final   Rotavirus A NOT DETECTED NOT DETECTED Final   Sapovirus (I, II, IV, and V) NOT DETECTED NOT DETECTED Final  Culture, fungus without smear     Status: None (Preliminary result)   Collection Time: 10/18/17  3:19 PM   Result Value Ref Range Status   Specimen Description Pleural, L  Final   Special Requests NONE  Final   Culture   Final    NO FUNGUS ISOLATED AFTER 1 DAY Performed at [facility], [address] [place], [place] [address]    Report Status PENDING  Incomplete  Culture, body fluid-bottle     Status: None (Preliminary result)   Collection Time: 10/18/17  3:19 PM  Result Value Ref Range Status   Specimen Description FLUID LEFT PLEURAL  Final   Special Requests BOTTLES DRAWN AEROBIC AND ANAEROBIC 10CC  Final   Culture   Final    NO GROWTH < 24 HOURS Performed at [facility], [place] [address] [place],  [address]    Report Status PENDING  Incomplete  Gram stain     Status: None   Collection Time: 10/18/17  3:19 PM  Result Value Ref Range Status   Specimen Description FLUID LEFT PLEURAL  Final   Special Requests NONE  Final   Gram Stain   Final    ABUNDANT WBC PRESENT,BOTH PMN AND MONONUCLEAR NO ORGANISMS SEEN Performed at [facility], [address], [place],  [address]    Report Status 10/18/2017 FINAL  Final         Radiology Studies: Dg Chest 1 View  Result Date: 10/18/2017 CLINICAL DATA:  PICC line placement EXAM: CHEST 1 VIEW COMPARISON:  10/18/2017 FINDINGS: Limited by positioning. Left upper extremity catheter tip is looped back upon itself over the brachiocephalic region. Small pleural effusions. Stable cardiomegaly with vascular congestion  and bibasilar airspace disease. Hiatal hernia. IMPRESSION: 1. Left upper extremity catheter tip is folded back upon itself over the brachiocephalic region 2. Small bilateral pleural effusions with bibasilar atelectasis or pneumonia. 3. Cardiomegaly with vascular congestion Electronically Signed   By: Donavan Foil M.D.   On: 10/18/2017 19:56   Dg Chest Port 1 View  Result Date: 10/18/2017 CLINICAL DATA:  PICC repositioning. EXAM: PORTABLE CHEST 1 VIEW COMPARISON:  Chest radiograph performed earlier  today at 5:42 p.m. FINDINGS: The lungs are mildly hypoexpanded. Small bilateral pleural effusions are suspected. Vascular congestion is noted. Bilateral central airspace opacities may reflect pulmonary edema or possibly pneumonia. No pneumothorax is seen. The cardiomediastinal silhouette is mildly enlarged. No acute osseous abnormalities are identified. The patient's left PICC is noted ending overlying the proximal SVC. Previously noted coiling has resolved. IMPRESSION: 1. Left PICC noted ending overlying the proximal SVC. Previously noted coiling has resolved. 2. Lungs mildly hypoexpanded. Vascular congestion and mild cardiomegaly. Bilateral central airspace opacities may reflect pulmonary edema or possibly pneumonia. Electronically Signed   By: Garald Balding M.D.   On: 10/18/2017 22:22   Dg Chest Port 1 View  Result Date: 10/18/2017 CLINICAL DATA:  Status post left thoracentesis. EXAM: PORTABLE CHEST 1 VIEW COMPARISON:  10/18/2017 FINDINGS: There is decreased pleural fluid on the left following thoracentesis. Residual left lung base opacity is likely atelectasis. There is no pneumothorax. There is opacity at the right lung base mostly obscuring hemidiaphragm consistent with a small right pleural effusion, new/increased when compared to the prior exam. A left PICC has been placed since the prior exam. Tip extends into the brachiocephalic vein but curls back on itself. It is unclear based on this single rotated AP view with this remains in the superior vena cava or extends back into the brachiocephalic vein. IMPRESSION: 1. Decreased left pleural fluid following thoracentesis. No pneumothorax or evidence of a procedure complication. 2. Left PICC positioned as detailed above, tip curling back on itself. 3. Small right pleural effusion. Electronically Signed   By: Lajean Manes M.D.   On: 10/18/2017 16:36   Dg Esophagus W/water Sol Cm  Result Date: 10/18/2017 CLINICAL DATA:  Nausea and vomiting.  Recent  gastric surgery. EXAM: ESOPHOGRAM/BARIUM SWALLOW TECHNIQUE: Single contrast examination was performed using water soluble contrast. The study was performed in the LPO semi-upright position. FLUOROSCOPY TIME:  Fluoroscopy Time:  1 minutes 18 seconds Radiation Exposure Index (if provided by the fluoroscopic device): 15.3 mGy COMPARISON:  CT scans dated 10/12/2017 and 10/05/2017 FINDINGS: The oropharyngeal swallowing mechanisms appear normal. There is a 2.5 cm area of circumferential narrowing of the distal esophagus just proximal to the gastric fundus. This is in the region of the mediastinal fluid collections seen on the prior CT scan. Water-soluble contrast flows freely through this area. Ms. Jans tolerated the procedure well. IMPRESSION: 2.5 cm circumferential narrowing of the distal esophagus just above the gastric fundus. I suspect this is is due to a compression of the esophagus by the mediastinal fluid demonstrated on the prior CT scan. No obstruction. The patient was quite reluctant to drink the contrast. Electronically Signed   By: Lorriane Shire M.D.   On: 10/18/2017 09:31   US Thoracentesis Asp Pleural Space W/img Guide  Result Date: 10/18/2017 INDICATION: Patient with history of mediastinal abscess, Crohn's disease, left pleural effusion. Request made for diagnostic and therapeutic left thoracentesis. EXAM: ULTRASOUND GUIDED DIAGNOSTIC AND THERAPEUTIC LEFT THORACENTESIS MEDICATIONS: None. COMPLICATIONS: None immediate. PROCEDURE: An ultrasound guided thoracentesis was thoroughly  discussed with the patient and questions answered. The benefits, risks, alternatives and complications were also discussed. The patient understands and wishes to proceed with the procedure. Written consent was obtained. Ultrasound was performed to localize and mark an adequate pocket of fluid in the left chest. The area was then prepped and draped in the normal sterile fashion. 1% Lidocaine was used for local anesthesia.  Under ultrasound guidance a Safe-T-Centesis catheter was introduced. Thoracentesis was performed. The catheter was removed and a dressing applied. FINDINGS: A total of approximately 780 cc of slightly hazy, blood-tinged fluid was removed. Samples were sent to the laboratory as requested by the clinical team. IMPRESSION: Successful ultrasound guided diagnostic and therapeutic left thoracentesis yielding 780 cc of pleural fluid. Read by: Rowe Robert, PA-C Electronically Signed   By: Markus Daft M.D.   On: 10/18/2017 16:35        Scheduled Meds: . Chlorhexidine Gluconate Cloth  6 each Topical Daily  . Chlorhexidine Gluconate Cloth  6 each Topical Daily  . feeding supplement  1 Container Oral BID BM  . insulin aspart  0-5 Units Subcutaneous QHS  . insulin aspart  0-9 Units Subcutaneous TID WC  . lip balm  1 application Topical BID  . metoCLOPramide (REGLAN) injection  5 mg Intravenous Q8H  . psyllium  1 packet Oral Daily  . sodium bicarbonate  650 mg Oral TID  . sodium chloride flush  10-40 mL Intracatheter Q12H   Continuous Infusions: . anidulafungin Stopped (10/19/17 1913)  . heparin 1,000 Units/hr (10/20/17 0600)  . piperacillin-tazobactam (ZOSYN)  IV 3.375 g (10/20/17 2263)  . Marland KitchenTPN (CLINIMIX-E) Adult 40 mL/hr at 10/20/17 0600     LOS: 6 days    Time spent: 40 minutes    WOODS, Geraldo Docker, MD [facility] Pager [phone]   If 7PM-7AM, please contact night-coverage [email or web address] Password [email or web address] 10/20/2017, 7:32 AM

## 2017-10-20 NOTE — Consult Note (Signed)
CC: ID requesting DFE d/t patient growing C. Glabrata in blood stream.  Complicated recent hospital course, admitted for mediastinal abscess.    Patient denies current visual complaints.  Pertinent ocular history: Patient states she is followed at [facility] by Dr. Alanda Slim for a retinal problem.  She has had poor vision out of that eye for the last year or so.   Also s/p CEIOL OU.  Bedside exam: VA cc (readers): OD: J3 OS: CF  IOP (tonopen): OD: 5 mmHg OS: 8 mmHg  C/S: w/q OU Cornea: clear OU AC: d/q OU Iris: normal OU Lens: PCIOL OU  Dilated with 2.5% phenylephrine and 1% tropicamide at 15:15  DFE:  Vitreous: clear OU Macula: macular scar OS, normal OD Vessels: slight attenuation Periphery: flat and attached OU  A/P 1. 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  Please do not hesitate to contact me with any further questions.  Truddie Coco, D.O. Glaucoma and Anterior Segment Surgeon [facility] [phone]

## 2017-10-20 NOTE — Consult Note (Signed)
Reason for Consult:mediastinal fluid Referring Physician: Dr. Othelia Pulling RISE TRAEGER is an 71 y.o. female.  HPI: 71 yo woman with a past history of Crohn's, hiatal hernia, psoriasis and a SBO, who had repair of a paraesophageal hernia on 09/08/2017. Had re-exploration for revision of a GT on 10/5. Was hospitalized for a month and complicated by DVT and moderate protein calorie malnutrition. Had fungemia with C. Glabrata C. Albicans, Treated with Invanz and anidulafungin. DC on 11/1. Readmitted a week later with intermittent fevers and difficulty swallowing. CT chest abdomen and pelvis showed a fluid collection in the mediastinum. She also had a small left effusion. Thoracentesis showed an exudate. IR was not comfortable placing a drain in the mediastinum. Currently on TPN and taking small amounts of clear liquids.  Zubrod Score: At the time of surgery this patient's most appropriate activity status/level should be described as: _0     0    Normal activity, no symptoms _1     1    Restricted in physical strenuous activity but ambulatory, able to do out light work _2     2    Ambulatory and capable of self care, unable to do work activities, up and about >50 % of waking hours                              _3     3    Only limited self care, in bed greater than 50% of waking hours _4     4    Completely disabled, no self care, confined to bed or chair _5     5    Moribund   Past Medical History:  Diagnosis Date  . Crohn disease ([place])   . Hiatal hernia 12/07/2015  . Left buttock abscess 04/21/2017  . Psoriasis   . SBO (small bowel obstruction) ([place]) 07/27/2017    Past Surgical History:  Procedure Laterality Date  . ANAL STENOSIS  ~ 2010  . APPENDECTOMY  1980's  . BOWEL RESECTION  2009  . CHOLECYSTECTOMY  1990's  . COLONOSCOPY  2013  . COLOSTOMY  2009   DEMASO  . COLOSTOMY TAKEDOWN  2009   "only had it for about 1 month" (08/22/2013)  . goiter  1999    Family History  Problem Relation Age of  Onset  . Diabetes Mother   . Heart disease Father   . Healthy Sister   . Hypertension Brother   . Colon cancer Brother        died with colon cancer  . Crohn's disease Paternal Aunt     Social History:  reports that she quit smoking about 21 years ago. Her smoking use included cigarettes. She has a 0.48 pack-year smoking history. she has never used smokeless tobacco. She reports that she does not drink alcohol or use drugs.  Allergies:  Allergies  Allergen Reactions  . Zithromax [Azithromycin] Swelling and Other (See Comments)    Mouth swelling and blisters   . Aspirin Other (See Comments)    Stomach cramping   . Ibuprofen Other (See Comments)    Pt states that she prefers not to take this medication.   . Iron Nausea And Vomiting and Other (See Comments)    FERROUS SULFATE  . Sulfa Antibiotics Nausea And Vomiting    Medications:  Scheduled: . Chlorhexidine Gluconate Cloth  6 each Topical Daily  . feeding supplement  1 Container Oral BID BM  . insulin aspart  0-5 Units Subcutaneous QHS  . insulin aspart  0-9 Units Subcutaneous TID WC  . lip balm  1 application Topical BID  . metoCLOPramide (REGLAN) injection  5 mg Intravenous Q8H  . psyllium  1 packet Oral Daily  . sodium bicarbonate  650 mg Oral TID  . sodium chloride flush  10-40 mL Intracatheter Q12H    Results for orders placed or performed during the hospital encounter of 10/16/2017 (from the past 48 hour(s))  Glucose, capillary     Status: Abnormal   Collection Time: 10/18/17  9:06 PM  Result Value Ref Range   Glucose-Capillary 111 (H) 65 - 99 mg/dL   Comment 1 Notify RN    Comment 2 Document in Chart   Comprehensive metabolic panel     Status: Abnormal   Collection Time: 10/19/17  5:00 AM  Result Value Ref Range   Sodium 136 135 - 145 mmol/L   Potassium 2.9 (L) 3.5 - 5.1 mmol/L    Comment: DELTA CHECK NOTED REPEATED TO VERIFY    Chloride 112 (H) 101 - 111 mmol/L   CO2 19 (L) 22 - 32 mmol/L   Glucose, Bld  119 (H) 65 - 99 mg/dL   BUN 8 6 - 20 mg/dL   Creatinine, Ser 0.94 0.44 - 1.00 mg/dL   Calcium 7.2 (L) 8.9 - 10.3 mg/dL   Total Protein 5.7 (L) 6.5 - 8.1 g/dL   Albumin 1.6 (L) 3.5 - 5.0 g/dL   AST 45 (H) 15 - 41 U/L   ALT 20 14 - 54 U/L   Alkaline Phosphatase 77 38 - 126 U/L   Total Bilirubin 0.8 0.3 - 1.2 mg/dL   GFR calc non Af Amer >60 >60 mL/min   GFR calc Af Amer >60 >60 mL/min    Comment: (NOTE) The eGFR has been calculated using the CKD EPI equation. This calculation has not been validated in all clinical situations. eGFR's persistently <60 mL/min signify possible Chronic Kidney Disease.    Anion gap 5 5 - 15  Magnesium     Status: Abnormal   Collection Time: 10/19/17  5:00 AM  Result Value Ref Range   Magnesium 1.4 (L) 1.7 - 2.4 mg/dL  Phosphorus     Status: Abnormal   Collection Time: 10/19/17  5:00 AM  Result Value Ref Range   Phosphorus 2.3 (L) 2.5 - 4.6 mg/dL  Triglycerides     Status: None   Collection Time: 10/19/17  5:00 AM  Result Value Ref Range   Triglycerides 104 <150 mg/dL    Comment: Performed at [facility], [place] [address] [place], [place] [address]  CBC     Status: Abnormal   Collection Time: 10/19/17  5:00 AM  Result Value Ref Range   WBC 24.3 (H) 4.0 - 10.5 K/uL   RBC 2.65 (L) 3.87 - 5.11 MIL/uL   Hemoglobin 7.6 (L) 12.0 - 15.0 g/dL   HCT 22.9 (L) 36.0 - 46.0 %   MCV 86.4 78.0 - 100.0 fL   MCH 28.7 26.0 - 34.0 pg   MCHC 33.2 30.0 - 36.0 g/dL   RDW 17.3 (H) 11.5 - 15.5 %   Platelets 360 150 - 400 K/uL  Glucose, capillary     Status: Abnormal   Collection Time: 10/19/17  7:30 AM  Result Value Ref Range   Glucose-Capillary 108 (H) 65 - 99 mg/dL  Culture, blood (routine x 2)     Status: None (Preliminary result)   Collection Time: 10/19/17  9:45 AM  Result Value Ref Range   Specimen Description BLOOD BLOOD RIGHT FOREARM    Special Requests IN PEDIATRIC BOTTLE Blood Culture adequate volume    Culture      NO GROWTH 1 DAY Performed at  [facility] [address], [place], [place] [address]    Report Status PENDING   Culture, blood (routine x 2)     Status: None (Preliminary result)   Collection Time: 10/19/17  9:48 AM  Result Value Ref Range   Specimen Description BLOOD RIGHT HAND    Special Requests IN PEDIATRIC BOTTLE Blood Culture adequate volume    Culture      NO GROWTH 1 DAY Performed at [facility], [place] [address] [place], [place] [address]    Report Status PENDING   Glucose, capillary     Status: Abnormal   Collection Time: 10/19/17 11:16 AM  Result Value Ref Range   Glucose-Capillary 148 (H) 65 - 99 mg/dL  Hemoglobin and hematocrit, blood     Status: Abnormal   Collection Time: 10/19/17  2:58 PM  Result Value Ref Range   Hemoglobin 7.9 (L) 12.0 - 15.0 g/dL   HCT 24.4 (L) 36.0 - 46.0 %  Brain natriuretic peptide     Status: Abnormal   Collection Time: 10/19/17  2:58 PM  Result Value Ref Range   B Natriuretic Peptide 215.3 (H) 0.0 - 100.0 pg/mL  Glucose, capillary     Status: None   Collection Time: 10/19/17  4:43 PM  Result Value Ref Range   Glucose-Capillary 91 65 - 99 mg/dL   Comment 1 Notify RN    Comment 2 Document in Chart   Glucose, capillary     Status: Abnormal   Collection Time: 10/19/17  9:02 PM  Result Value Ref Range   Glucose-Capillary 114 (H) 65 - 99 mg/dL   Comment 1 Notify RN    Comment 2 Document in Chart   Heparin level (unfractionated)     Status: Abnormal   Collection Time: 10/20/17  1:49 AM  Result Value Ref Range   Heparin Unfractionated <0.10 (L) 0.30 - 0.70 IU/mL    Comment:        IF HEPARIN RESULTS ARE BELOW EXPECTED VALUES, AND PATIENT DOSAGE HAS BEEN CONFIRMED, SUGGEST FOLLOW UP TESTING OF ANTITHROMBIN III LEVELS.   Triglycerides     Status: None   Collection Time: 10/20/17  4:18 AM  Result Value Ref Range   Triglycerides 79 <150 mg/dL    Comment: Performed at [facility] [address] [place], [place] [address]  CBC     Status:  Abnormal   Collection Time: 10/20/17  4:18 AM  Result Value Ref Range   WBC 24.2 (H) 4.0 - 10.5 K/uL   RBC 2.70 (L) 3.87 - 5.11 MIL/uL   Hemoglobin 7.6 (L) 12.0 - 15.0 g/dL   HCT 23.3 (L) 36.0 - 46.0 %   MCV 86.3 78.0 - 100.0 fL   MCH 28.1 26.0 - 34.0 pg   MCHC 32.6 30.0 - 36.0 g/dL   RDW 17.2 (H) 11.5 - 15.5 %   Platelets 362 150 - 400 K/uL  Comprehensive metabolic panel     Status: Abnormal   Collection Time: 10/20/17  4:18 AM  Result Value Ref Range   Sodium 135 135 - 145 mmol/L   Potassium 3.4 (L) 3.5 - 5.1 mmol/L   Chloride 112 (H) 101 - 111 mmol/L   CO2 20 (L)  22 - 32 mmol/L   Glucose, Bld 109 (H) 65 - 99 mg/dL   BUN 10 6 - 20 mg/dL   Creatinine, Ser 0.85 0.44 - 1.00 mg/dL   Calcium 7.6 (L) 8.9 - 10.3 mg/dL   Total Protein 6.1 (L) 6.5 - 8.1 g/dL   Albumin 1.6 (L) 3.5 - 5.0 g/dL   AST 117 (H) 15 - 41 U/L   ALT 50 14 - 54 U/L   Alkaline Phosphatase 109 38 - 126 U/L   Total Bilirubin 0.5 0.3 - 1.2 mg/dL   GFR calc non Af Amer >60 >60 mL/min   GFR calc Af Amer >60 >60 mL/min    Comment: (NOTE) The eGFR has been calculated using the CKD EPI equation. This calculation has not been validated in all clinical situations. eGFR's persistently <60 mL/min signify possible Chronic Kidney Disease.    Anion gap 3 (L) 5 - 15  Magnesium     Status: None   Collection Time: 10/20/17  4:18 AM  Result Value Ref Range   Magnesium 2.4 1.7 - 2.4 mg/dL  Phosphorus     Status: None   Collection Time: 10/20/17  4:18 AM  Result Value Ref Range   Phosphorus 3.1 2.5 - 4.6 mg/dL  HIV antibody     Status: None   Collection Time: 10/20/17  4:18 AM  Result Value Ref Range   HIV Screen 4th Generation wRfx Non Reactive Non Reactive    Comment: (NOTE) Performed At: [facility] [place], [place] [id number] Rush Farmer MD [id number]   Hepatitis c antibody (reflex)     Status: None   Collection Time: 10/20/17  4:18 AM  Result Value Ref Range   HCV Ab 0.1 0.0 - 0.9  s/co ratio    Comment: (NOTE) Performed At: [facility] [place], [place] [id number] Rush Farmer MD [id number]   HCV Comment:     Status: None   Collection Time: 10/20/17  4:18 AM  Result Value Ref Range   Comment: Comment     Comment: (NOTE) Non reactive HCV antibody screen is consistent with no HCV infection, unless recent infection is suspected or other evidence exists to indicate HCV infection. Performed At: [facility] [place], [place] [id number] Rush Farmer MD [id number]   Glucose, capillary     Status: None   Collection Time: 10/20/17  7:54 AM  Result Value Ref Range   Glucose-Capillary 97 65 - 99 mg/dL   Comment 1 Notify RN    Comment 2 Document in Chart   Vitamin B12     Status: Abnormal   Collection Time: 10/20/17  8:00 AM  Result Value Ref Range   Vitamin B-12 1,289 (H) 180 - 914 pg/mL    Comment: Performed at [facility], [place] [address] [place], [place] [address]  Iron and TIBC     Status: Abnormal   Collection Time: 10/20/17  8:00 AM  Result Value Ref Range   Iron 9 (L) 28 - 170 ug/dL   TIBC NOT CALCULATED 250 - 450 ug/dL    Comment: DUE TO TRFN <70   Saturation Ratios NOT CALCULATED 10.4 - 31.8 %    Comment: DUE TO TRFN <70   UIBC NOT CALCULATED ug/dL    Comment: DUE TO TRFN <70 Performed at [facility], [place] [address] [place], [place] [address]   Ferritin     Status: Abnormal   Collection Time: 10/20/17  8:00 AM  Result Value Ref Range   Ferritin 1,237 (H) 11 - 307 ng/mL    Comment: Performed at [facility], [place] [address] [place], [place] [address]  Folate     Status: None   Collection Time: 10/20/17  8:00 AM  Result Value Ref Range   Folate 8.9 >5.9 ng/mL    Comment: Performed at [facility], [place] [address] [place], [place] [address]  Reticulocytes     Status: Abnormal   Collection Time: 10/20/17  9:10 AM  Result Value Ref Range   Retic Ct Pct 1.3 0.4 - 3.1  %   RBC. 2.84 (L) 3.87 - 5.11 MIL/uL   Retic Count, Absolute 36.9 19.0 - 186.0 K/uL  Prepare RBC     Status: None   Collection Time: 10/20/17  9:10 AM  Result Value Ref Range   Order Confirmation ORDER PROCESSED BY BLOOD BANK   Type and screen [place]     Status: None (Preliminary result)   Collection Time: 10/20/17  9:10 AM  Result Value Ref Range   ABO/RH(D) O POS    Antibody Screen NEG    Sample Expiration 10/23/2017    Unit Number [id number]    Blood Component Type RED CELLS,LR    Unit division 00    Status of Unit ISSUED    Transfusion Status OK TO TRANSFUSE    Crossmatch Result Compatible   Occult blood card to lab, stool     Status: None   Collection Time: 10/20/17 10:32 AM  Result Value Ref Range   Fecal Occult Bld NEGATIVE NEGATIVE  Glucose, capillary     Status: None   Collection Time: 10/20/17 12:06 PM  Result Value Ref Range   Glucose-Capillary 88 65 - 99 mg/dL   Comment 1 Notify RN    Comment 2 Document in Chart   Heparin level (unfractionated)     Status: Abnormal   Collection Time: 10/20/17  1:06 PM  Result Value Ref Range   Heparin Unfractionated 0.12 (L) 0.30 - 0.70 IU/mL    Comment:        IF HEPARIN RESULTS ARE BELOW EXPECTED VALUES, AND PATIENT DOSAGE HAS BEEN CONFIRMED, SUGGEST FOLLOW UP TESTING OF ANTITHROMBIN III LEVELS.   Glucose, capillary     Status: Abnormal   Collection Time: 10/20/17  4:38 PM  Result Value Ref Range   Glucose-Capillary 102 (H) 65 - 99 mg/dL   Comment 1 Notify RN    Comment 2 Document in Chart     Dg Chest Port 1 View  Result Date: 10/18/2017 CLINICAL DATA:  PICC repositioning. EXAM: PORTABLE CHEST 1 VIEW COMPARISON:  Chest radiograph performed earlier today at 5:42 p.m. FINDINGS: The lungs are mildly hypoexpanded. Small bilateral pleural effusions are suspected. Vascular congestion is noted. Bilateral central airspace opacities may reflect pulmonary edema or possibly pneumonia. No pneumothorax  is seen. The cardiomediastinal silhouette is mildly enlarged. No acute osseous abnormalities are identified. The patient's left PICC is noted ending overlying the proximal SVC. Previously noted coiling has resolved. IMPRESSION: 1. Left PICC noted ending overlying the proximal SVC. Previously noted coiling has resolved. 2. Lungs mildly hypoexpanded. Vascular congestion and mild cardiomegaly. Bilateral central airspace opacities may reflect pulmonary edema or possibly pneumonia. Electronically Signed   By: Garald Balding M.D.   On: 10/18/2017 22:22    ROS Blood pressure (!) 103/48, pulse 98, temperature 98.9 F (37.2 C), temperature source Oral, resp. rate (!) 22, height  5' 4.5" (1.638 m), weight 121 lb 14.6 oz (55.3 kg), SpO2 100 %. Physical Exam  Vitals reviewed. Constitutional: She is oriented to person, place, and time. No distress.  Elderly frail appearing  HENT:  Head: Normocephalic and atraumatic.  Mouth/Throat: No oropharyngeal exudate.  Eyes: Conjunctivae are normal. No scleral icterus.  Neck: Neck supple. No thyromegaly present.  Cardiovascular: Normal rate, regular rhythm and normal heart sounds.  No murmur heard. Respiratory: Effort normal and breath sounds normal. No respiratory distress. She has no wheezes. She has no rales.  GI: Soft. She exhibits no distension. There is no tenderness.  Musculoskeletal: She exhibits no edema.  Lymphadenopathy:    She has no cervical adenopathy.  Neurological: She is alert and oriented to person, place, and time. No cranial nerve deficit.  Skin: Skin is warm and dry.    Assessment/Plan: 71 yo woman with persistent fevers and a fluid collection in the mediastinum 6 weeks out from repair of a paraesophageal hernia. IR is unable to drain the mediastinal fluid. There is concern for an infected collection although this may well just be postop seroma. I discussed doing a left VATS to drain the mediastinum with Danielle [name]. I informed her of the  general nature of the procedure, the incisions to be used, the use of drainage tubes. I advised her as to the indications, risks, benefits, and alternatives. She understands there is no guarantee this will resolve her infection. She understands the risks include but are not limited to death, MI, DVT, PE, bleeding, possible need for transfusion, infection, as well as the possibility of other unforeseeable complications.  She agrees to proceed  Will stop heparin drip at 0200  transport to [facility] OR in AM by 0600. Will defer to Hospitalists if they want to transfer her overnight.    Melrose Nakayama 10/20/2017, 7:18 PM

## 2017-10-20 NOTE — Progress Notes (Addendum)
Subjective: Still feeling fatigued and still spiking fevers every day   Antibiotics:  Anti-infectives (From admission, onward)   Start     Dose/Rate Route Frequency Ordered Stop   10/17/17 0800  vancomycin (VANCOCIN) IVPB 750 mg/150 ml premix  Status:  Discontinued     750 mg 150 mL/hr over 60 Minutes Intravenous Every 24 hours 10/16/17 1220 10/17/17 1023   10/16/17 1800  anidulafungin (ERAXIS) 100 mg in sodium chloride 0.9 % 100 mL IVPB     100 mg 78 mL/hr over 100 Minutes Intravenous Every 24 hours 10/15/17 1657     10/16/17 0600  vancomycin (VANCOCIN) IVPB 1000 mg/200 mL premix  Status:  Discontinued     1,000 mg 200 mL/hr over 60 Minutes Intravenous Every 36 hours 10/22/2017 2308 10/16/17 1220   10/15/17 1800  anidulafungin (ERAXIS) 200 mg in sodium chloride 0.9 % 200 mL IVPB     200 mg 78 mL/hr over 200 Minutes Intravenous  Once 10/15/17 1657 10/15/17 2148   10/17/2017 2330  piperacillin-tazobactam (ZOSYN) IVPB 3.375 g  Status:  Discontinued     3.375 g 100 mL/hr over 30 Minutes Intravenous Every 8 hours 10/29/2017 2257 10/07/2017 2259   10/30/2017 2315  piperacillin-tazobactam (ZOSYN) IVPB 3.375 g     3.375 g 12.5 mL/hr over 240 Minutes Intravenous Every 8 hours 10/30/2017 2300     10/27/2017 1930  vancomycin (VANCOCIN) IVPB 1000 mg/200 mL premix     1,000 mg 200 mL/hr over 60 Minutes Intravenous  Once 11/01/2017 1915 11/04/2017 2044   11/01/2017 1530  piperacillin-tazobactam (ZOSYN) IVPB 3.375 g     3.375 g 100 mL/hr over 30 Minutes Intravenous  Once 10/19/2017 1529 10/13/2017 1604      Medications: Scheduled Meds: . Chlorhexidine Gluconate Cloth  6 each Topical Daily  . feeding supplement  1 Container Oral BID BM  . insulin aspart  0-5 Units Subcutaneous QHS  . insulin aspart  0-9 Units Subcutaneous TID WC  . lip balm  1 application Topical BID  . metoCLOPramide (REGLAN) injection  5 mg Intravenous Q8H  . psyllium  1 packet Oral Daily  . sodium bicarbonate  650 mg Oral TID    . sodium chloride flush  10-40 mL Intracatheter Q12H   Continuous Infusions: . anidulafungin Stopped (10/19/17 1913)  . Marland KitchenTPN (CLINIMIX-E) Adult     And  . fat emulsion    . heparin 1,400 Units/hr (10/20/17 1352)  . piperacillin-tazobactam (ZOSYN)  IV 3.375 g (10/20/17 1352)  . Marland KitchenTPN (CLINIMIX-E) Adult 40 mL/hr at 10/20/17 0600   PRN Meds:.[DISCONTINUED] acetaminophen **OR** acetaminophen, acetaminophen, alum & mag hydroxide-simeth, bisacodyl, dicyclomine, diphenhydrAMINE, diphenoxylate-atropine, guaiFENesin-dextromethorphan, hydrocortisone, hydrocortisone cream, magic mouthwash, menthol-cetylpyridinium, morphine injection, nystatin, phenol, promethazine, sodium chloride flush    Objective: Weight change:   Intake/Output Summary (Last 24 hours) at 10/20/2017 1655 Last data filed at 10/20/2017 1537 Gross per 24 hour  Intake 1897.2 ml  Output 1875 ml  Net 22.2 ml   Blood pressure (!) 103/48, pulse 99, temperature 99 F (37.2 C), temperature source Oral, resp. rate (!) 23, height 5' 4.5" (1.638 m), weight 123 lb 3.8 oz (55.9 kg), SpO2 100 %. Temp:  [97.9 F (36.6 C)-102.9 F (39.4 C)] 99 F (37.2 C) (11/14 1537) Pulse Rate:  [88-110] 99 (11/14 1537) Resp:  [18-35] 23 (11/14 1537) BP: (87-122)/(39-52) 103/48 (11/14 1537) SpO2:  [99 %-100 %] 100 % (11/14 1537)  Physical Exam: General: Alert and awake, oriented x3, not in  any acute distress. HEENT: anicteric sclera, EOMI CVS tachy rate, normal r,  no murmur rubs or gallops Chest: clear to auscultation bilaterally, no wheezing, rales or rhonchi Abdomen: bandage in place epigastrium Extremities: no  clubbing or edema noted bilaterally Skin: no rashes Neuro: nonfocal  CBC:  CBC Latest Ref Rng & Units 10/20/2017 10/19/2017 10/19/2017  WBC 4.0 - 10.5 K/uL 24.2(H) - 24.3(H)  Hemoglobin 12.0 - 15.0 g/dL 7.6(L) 7.9(L) 7.6(L)  Hematocrit 36.0 - 46.0 % 23.3(L) 24.4(L) 22.9(L)  Platelets 150 - 400 K/uL 362 - 360       BMET Recent Labs    10/19/17 0500 10/20/17 0418  NA 136 135  K 2.9* 3.4*  CL 112* 112*  CO2 19* 20*  GLUCOSE 119* 109*  BUN 8 10  CREATININE 0.94 0.85  CALCIUM 7.2* 7.6*     Liver Panel  Recent Labs    10/19/17 0500 10/20/17 0418  PROT 5.7* 6.1*  ALBUMIN 1.6* 1.6*  AST 45* 117*  ALT 20 50  ALKPHOS 77 109  BILITOT 0.8 0.5       Sedimentation Rate No results for input(s): ESRSEDRATE in the last 72 hours. C-Reactive Protein No results for input(s): CRP in the last 72 hours.  Micro Results: Recent Results (from the past 720 hour(s))  Blood Culture (routine x 2)     Status: None   Collection Time: 10/01/17 11:27 AM  Result Value Ref Range Status   Specimen Description BLOOD RIGHT ARM  Final   Special Requests IN PEDIATRIC BOTTLE Blood Culture adequate volume  Final   Culture   Final    NO GROWTH 5 DAYS Performed at [facility], [address], [place], [place] [address]    Report Status 10/06/2017 FINAL  Final  Blood Culture (routine x 2)     Status: None   Collection Time: 10/01/17  6:55 PM  Result Value Ref Range Status   Specimen Description BLOOD LEFT HAND  Final   Special Requests IN PEDIATRIC BOTTLE Blood Culture adequate volume  Final   Culture   Final    NO GROWTH 5 DAYS Performed at [facility], [place] [address], [place], [place] [address]    Report Status 10/06/2017 FINAL  Final  MRSA PCR Screening     Status: None   Collection Time: 10/04/17 11:03 AM  Result Value Ref Range Status   MRSA by PCR NEGATIVE NEGATIVE Final    Comment:        The GeneXpert MRSA Assay (FDA approved for NASAL specimens only), is one component of a comprehensive MRSA colonization surveillance program. It is not intended to diagnose MRSA infection nor to guide or monitor treatment for MRSA infections.   Culture, blood (Routine x 2)     Status: None   Collection Time: 11/02/2017  2:05 PM  Result Value Ref Range Status   Specimen Description  BLOOD LEFT FOREARM  Final   Special Requests   Final    BOTTLES DRAWN AEROBIC AND ANAEROBIC Blood Culture results may not be optimal due to an inadequate volume of blood received in culture bottles   Culture   Final    NO GROWTH 5 DAYS Performed at [facility], [place] [address] [place], [place] [address]    Report Status 10/19/2017 FINAL  Final  MRSA PCR Screening     Status: None   Collection Time: 11/04/2017 10:45 PM  Result Value Ref Range Status   MRSA by PCR NEGATIVE  NEGATIVE Final    Comment:        The GeneXpert MRSA Assay (FDA approved for NASAL specimens only), is one component of a comprehensive MRSA colonization surveillance program. It is not intended to diagnose MRSA infection nor to guide or monitor treatment for MRSA infections.   Culture, blood (Routine x 2)     Status: None   Collection Time: 10/26/2017 11:28 PM  Result Value Ref Range Status   Specimen Description BLOOD LEFT HAND  Final   Special Requests IN PEDIATRIC BOTTLE Blood Culture adequate volume  Final   Culture   Final    NO GROWTH 5 DAYS Performed at [facility], [place] [address] [place], [place] [address]    Report Status 10/20/2017 FINAL  Final  C difficile quick scan w PCR reflex     Status: None   Collection Time: 10/16/17  2:47 PM  Result Value Ref Range Status   C Diff antigen NEGATIVE NEGATIVE Final   C Diff toxin NEGATIVE NEGATIVE Final   C Diff interpretation No C. difficile detected.  Final  Gastrointestinal Panel by PCR , Stool     Status: None   Collection Time: 10/16/17  2:48 PM  Result Value Ref Range Status   Campylobacter species NOT DETECTED NOT DETECTED Final   Plesimonas shigelloides NOT DETECTED NOT DETECTED Final   Salmonella species NOT DETECTED NOT DETECTED Final   Yersinia enterocolitica NOT DETECTED NOT DETECTED Final   Vibrio species NOT DETECTED NOT DETECTED Final   Vibrio cholerae NOT DETECTED NOT DETECTED Final   Enteroaggregative E coli (EAEC) NOT  DETECTED NOT DETECTED Final   Enteropathogenic E coli (EPEC) NOT DETECTED NOT DETECTED Final   Enterotoxigenic E coli (ETEC) NOT DETECTED NOT DETECTED Final   Shiga like toxin producing E coli (STEC) NOT DETECTED NOT DETECTED Final   Shigella/Enteroinvasive E coli (EIEC) NOT DETECTED NOT DETECTED Final   Cryptosporidium NOT DETECTED NOT DETECTED Final   Cyclospora cayetanensis NOT DETECTED NOT DETECTED Final   Entamoeba histolytica NOT DETECTED NOT DETECTED Final   Giardia lamblia NOT DETECTED NOT DETECTED Final   Adenovirus F40/41 NOT DETECTED NOT DETECTED Final   Astrovirus NOT DETECTED NOT DETECTED Final   Norovirus GI/GII NOT DETECTED NOT DETECTED Final   Rotavirus A NOT DETECTED NOT DETECTED Final   Sapovirus (I, II, IV, and V) NOT DETECTED NOT DETECTED Final  Culture, fungus without smear     Status: None (Preliminary result)   Collection Time: 10/18/17  3:19 PM  Result Value Ref Range Status   Specimen Description Pleural, L  Final   Special Requests NONE  Final   Culture   Final    NO FUNGUS ISOLATED AFTER 2 DAYS Performed at [facility], [address], [place], [place] [address]    Report Status PENDING  Incomplete  Culture, body fluid-bottle     Status: None (Preliminary result)   Collection Time: 10/18/17  3:19 PM  Result Value Ref Range Status   Specimen Description FLUID LEFT PLEURAL  Final   Special Requests BOTTLES DRAWN AEROBIC AND ANAEROBIC 10CC  Final   Culture   Final    NO GROWTH 2 DAYS Performed at [facility], [place] [address], [place], [place] [address]    Report Status PENDING  Incomplete  Gram stain     Status: None   Collection Time: 10/18/17  3:19 PM  Result Value Ref Range Status   Specimen Description FLUID LEFT PLEURAL  Final   Special Requests NONE  Final   Gram Stain   Final    ABUNDANT WBC PRESENT,BOTH PMN AND MONONUCLEAR NO ORGANISMS SEEN Performed at [facility], [place] [address] [place], [place] [address]    Report  Status 10/18/2017 FINAL  Final  Culture, blood (routine x 2)     Status: None (Preliminary result)   Collection Time: 10/19/17  9:45 AM  Result Value Ref Range Status   Specimen Description BLOOD BLOOD RIGHT FOREARM  Final   Special Requests IN PEDIATRIC BOTTLE Blood Culture adequate volume  Final   Culture   Final    NO GROWTH 1 DAY Performed at [facility], [place] [address], [place], [place] [address]    Report Status PENDING  Incomplete  Culture, blood (routine x 2)     Status: None (Preliminary result)   Collection Time: 10/19/17  9:48 AM  Result Value Ref Range Status   Specimen Description BLOOD RIGHT HAND  Final   Special Requests IN PEDIATRIC BOTTLE Blood Culture adequate volume  Final   Culture   Final    NO GROWTH 1 DAY Performed at [facility], [place] [address], [place], [place] [address]    Report Status PENDING  Incomplete    Studies/Results: Dg Chest 1 View  Result Date: 10/18/2017 CLINICAL DATA:  PICC line placement EXAM: CHEST 1 VIEW COMPARISON:  10/18/2017 FINDINGS: Limited by positioning. Left upper extremity catheter tip is looped back upon itself over the brachiocephalic region. Small pleural effusions. Stable cardiomegaly with vascular congestion and bibasilar airspace disease. Hiatal hernia. IMPRESSION: 1. Left upper extremity catheter tip is folded back upon itself over the brachiocephalic region 2. Small bilateral pleural effusions with bibasilar atelectasis or pneumonia. 3. Cardiomegaly with vascular congestion Electronically Signed   By: Donavan Foil M.D.   On: 10/18/2017 19:56   Dg Chest Port 1 View  Result Date: 10/18/2017 CLINICAL DATA:  PICC repositioning. EXAM: PORTABLE CHEST 1 VIEW COMPARISON:  Chest radiograph performed earlier today at 5:42 p.m. FINDINGS: The lungs are mildly hypoexpanded. Small bilateral pleural effusions are suspected. Vascular congestion is noted. Bilateral central airspace opacities may reflect pulmonary edema or  possibly pneumonia. No pneumothorax is seen. The cardiomediastinal silhouette is mildly enlarged. No acute osseous abnormalities are identified. The patient's left PICC is noted ending overlying the proximal SVC. Previously noted coiling has resolved. IMPRESSION: 1. Left PICC noted ending overlying the proximal SVC. Previously noted coiling has resolved. 2. Lungs mildly hypoexpanded. Vascular congestion and mild cardiomegaly. Bilateral central airspace opacities may reflect pulmonary edema or possibly pneumonia. Electronically Signed   By: Garald Balding M.D.   On: 10/18/2017 22:22      Assessment/Plan:  INTERVAL HISTORY: Since still febrile.  She is unable to swallow pills due to her narrowing of her esophagus which was presumed due to impingement by fluid in the mediastinum   Principal Problem:   Septic shock (HCC) Active Problems:   Leukocytosis   Perianal Crohn's disease, with fistula ([place])   Acute renal failure superimposed on stage 3 chronic kidney disease ([place])   Incarcerated hiatal hernia s/p reduction 09/08/2017   Immunosuppressed status (HCC)   Hypotension   Nausea and vomiting   S/P thoracentesis   Anasarca   DVT (deep venous thrombosis) (HCC)   Protein-calorie malnutrition, moderate (HCC)   Mediastinal abscess (HCC)   Mediastinal post-op seromas   Pleural effusion, left   Patient's noncompliance with intervention / medical advice  LYNDSEE CASA is a 71 y.o. 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  She is on undergone exploratory laparotomy earlier in October which had shown purulent peritonitis and leaking around her gastrostomy tube.  Her pelvic abscesses have been drained and grew Candida glabrata and Candida albicans and she had C. Glabrata in her blood stream.  He also has been found along the way to have an axillary vein thrombosis   FUO:  Suspicion is that she has a site that needs is another  surgical intervention to control infection.  While the fluid in the pleural space does not look like a clear-cut empyema perhaps this fluid is DISTINCT from that around the mediastinum   Certainly thoracocentesis has made ZERO impact on her ability to swallow or on her fevers.   I would recommend reimaging her with CT of the CHEST with contrast and abdomen and pelvis  Today she tells me "no more CT"s"  I will check repeat Doppler to evaluate site or axillary vein thrombus but the CHEST seems to be where her exam findings are pointing to the source of infection and I think she is gong to need HELP FROM A CT SURGEON to fix this   Continue zosyn and echinocandin for now and will consider adding zyvox back in for MRSA but this seems lie an issue of undrained abscess  Note if she needs CT surgical evaluation I would transfer her to [facility]  Mediastinal empyema: See above  Hx of fungemia: GREATLY appreciate Dr. Cher Nakai seeing patient and ruling out fungal ophthalmitis.      LOS: 6 days   Alcide Evener 10/20/2017, 4:55 PM

## 2017-10-21 ENCOUNTER — Inpatient Hospital Stay (HOSPITAL_COMMUNITY): Payer: Medicare HMO | Admitting: Anesthesiology

## 2017-10-21 ENCOUNTER — Encounter (HOSPITAL_COMMUNITY): Admission: EM | Disposition: E | Payer: Self-pay | Source: Home / Self Care | Attending: Internal Medicine

## 2017-10-21 ENCOUNTER — Inpatient Hospital Stay (HOSPITAL_COMMUNITY): Payer: Medicare HMO

## 2017-10-21 HISTORY — PX: VIDEO ASSISTED THORACOSCOPY (VATS)/DECORTICATION: SHX6171

## 2017-10-21 LAB — COMPREHENSIVE METABOLIC PANEL
ALK PHOS: 112 U/L (ref 38–126)
ALT: 37 U/L (ref 14–54)
AST: 60 U/L — AB (ref 15–41)
Albumin: 1.5 g/dL — ABNORMAL LOW (ref 3.5–5.0)
Anion gap: 6 (ref 5–15)
BILIRUBIN TOTAL: 0.8 mg/dL (ref 0.3–1.2)
BUN: 11 mg/dL (ref 6–20)
CALCIUM: 7.5 mg/dL — AB (ref 8.9–10.3)
CHLORIDE: 109 mmol/L (ref 101–111)
CO2: 19 mmol/L — ABNORMAL LOW (ref 22–32)
CREATININE: 0.87 mg/dL (ref 0.44–1.00)
Glucose, Bld: 119 mg/dL — ABNORMAL HIGH (ref 65–99)
Potassium: 3.4 mmol/L — ABNORMAL LOW (ref 3.5–5.1)
Sodium: 134 mmol/L — ABNORMAL LOW (ref 135–145)
Total Protein: 6 g/dL — ABNORMAL LOW (ref 6.5–8.1)

## 2017-10-21 LAB — TYPE AND SCREEN
ABO/RH(D): O POS
Antibody Screen: NEGATIVE
Unit division: 0

## 2017-10-21 LAB — CBC
HEMATOCRIT: 26 % — AB (ref 36.0–46.0)
Hemoglobin: 8.6 g/dL — ABNORMAL LOW (ref 12.0–15.0)
MCH: 28.3 pg (ref 26.0–34.0)
MCHC: 33.1 g/dL (ref 30.0–36.0)
MCV: 85.5 fL (ref 78.0–100.0)
Platelets: 328 10*3/uL (ref 150–400)
RBC: 3.04 MIL/uL — AB (ref 3.87–5.11)
RDW: 16.3 % — ABNORMAL HIGH (ref 11.5–15.5)
WBC: 25.6 10*3/uL — AB (ref 4.0–10.5)

## 2017-10-21 LAB — GLUCOSE, CAPILLARY: Glucose-Capillary: 163 mg/dL — ABNORMAL HIGH (ref 65–99)

## 2017-10-21 LAB — BPAM RBC
Blood Product Expiration Date: 201812052359
ISSUE DATE / TIME: 201811141320
Unit Type and Rh: 5100

## 2017-10-21 LAB — PREPARE RBC (CROSSMATCH)

## 2017-10-21 LAB — SURGICAL PCR SCREEN
MRSA, PCR: NEGATIVE
Staphylococcus aureus: NEGATIVE

## 2017-10-21 LAB — MAGNESIUM: MAGNESIUM: 1.8 mg/dL (ref 1.7–2.4)

## 2017-10-21 LAB — PHOSPHORUS: PHOSPHORUS: 2.3 mg/dL — AB (ref 2.5–4.6)

## 2017-10-21 LAB — ABO/RH: ABO/RH(D): O POS

## 2017-10-21 SURGERY — VIDEO ASSISTED THORACOSCOPY (VATS)/DECORTICATION
Anesthesia: General | Site: Chest | Laterality: Left

## 2017-10-21 MED ORDER — VANCOMYCIN HCL IN DEXTROSE 1-5 GM/200ML-% IV SOLN
INTRAVENOUS | Status: AC
  Filled 2017-10-21: qty 200

## 2017-10-21 MED ORDER — PROMETHAZINE HCL 25 MG/ML IJ SOLN
12.5000 mg | Freq: Once | INTRAMUSCULAR | Status: AC
  Administered 2017-10-21: 12.5 mg via INTRAVENOUS
  Filled 2017-10-21: qty 1

## 2017-10-21 MED ORDER — ONDANSETRON HCL 4 MG/2ML IJ SOLN
INTRAMUSCULAR | Status: DC | PRN
  Administered 2017-10-21: 4 mg via INTRAVENOUS

## 2017-10-21 MED ORDER — MIDAZOLAM HCL 2 MG/2ML IJ SOLN
INTRAMUSCULAR | Status: AC
  Filled 2017-10-21: qty 2

## 2017-10-21 MED ORDER — DIPHENHYDRAMINE HCL 50 MG/ML IJ SOLN: 12.5000 mg | Freq: Four times a day (QID) | INTRAMUSCULAR | Status: DC | PRN

## 2017-10-21 MED ORDER — POTASSIUM CHLORIDE 10 MEQ/50ML IV SOLN
10.0000 meq | Freq: Every day | INTRAVENOUS | Status: DC | PRN
  Administered 2017-10-22 – 2017-11-02 (×6): 10 meq via INTRAVENOUS
  Filled 2017-10-21 (×6): qty 50

## 2017-10-21 MED ORDER — SENNOSIDES-DOCUSATE SODIUM 8.6-50 MG PO TABS
1.0000 | ORAL_TABLET | Freq: Every day | ORAL | Status: DC
  Filled 2017-10-21 (×2): qty 1

## 2017-10-21 MED ORDER — PHENYLEPHRINE HCL 10 MG/ML IJ SOLN
INTRAVENOUS | Status: DC | PRN
  Administered 2017-10-21: 20 ug/min via INTRAVENOUS

## 2017-10-21 MED ORDER — TRAMADOL HCL 50 MG PO TABS
50.0000 mg | ORAL_TABLET | Freq: Four times a day (QID) | ORAL | Status: DC | PRN
  Administered 2017-10-22: 100 mg via ORAL
  Filled 2017-10-21: qty 2

## 2017-10-21 MED ORDER — SODIUM CHLORIDE 0.9 % IV SOLN
Freq: Once | INTRAVENOUS | Status: AC
  Administered 2017-10-21: 08:00:00 via INTRAVENOUS

## 2017-10-21 MED ORDER — ACETAMINOPHEN 500 MG PO TABS
1000.0000 mg | ORAL_TABLET | Freq: Four times a day (QID) | ORAL | Status: AC
  Administered 2017-10-22 – 2017-10-24 (×5): 1000 mg via ORAL
  Filled 2017-10-21 (×7): qty 2

## 2017-10-21 MED ORDER — VANCOMYCIN HCL IN DEXTROSE 1-5 GM/200ML-% IV SOLN
1000.0000 mg | Freq: Two times a day (BID) | INTRAVENOUS | Status: AC
  Administered 2017-10-21: 1000 mg via INTRAVENOUS
  Filled 2017-10-21: qty 200

## 2017-10-21 MED ORDER — VANCOMYCIN HCL IN DEXTROSE 1-5 GM/200ML-% IV SOLN
1000.0000 mg | INTRAVENOUS | Status: AC
  Administered 2017-10-21: 1000 mg via INTRAVENOUS
  Filled 2017-10-21: qty 200

## 2017-10-21 MED ORDER — LACTATED RINGERS IV SOLN
INTRAVENOUS | Status: DC | PRN
  Administered 2017-10-21: 08:00:00 via INTRAVENOUS

## 2017-10-21 MED ORDER — SODIUM CHLORIDE 0.9% FLUSH: 9.0000 mL | INTRAVENOUS | Status: DC | PRN

## 2017-10-21 MED ORDER — DEXAMETHASONE SODIUM PHOSPHATE 4 MG/ML IJ SOLN
INTRAMUSCULAR | Status: DC | PRN
  Administered 2017-10-21: 10 mg via INTRAVENOUS

## 2017-10-21 MED ORDER — NALOXONE HCL 0.4 MG/ML IJ SOLN: 0.4000 mg | INTRAMUSCULAR | Status: DC | PRN

## 2017-10-21 MED ORDER — PROPOFOL 10 MG/ML IV BOLUS
INTRAVENOUS | Status: AC
  Filled 2017-10-21: qty 20

## 2017-10-21 MED ORDER — FENTANYL CITRATE (PF) 250 MCG/5ML IJ SOLN
INTRAMUSCULAR | Status: DC | PRN
  Administered 2017-10-21: 50 ug via INTRAVENOUS
  Administered 2017-10-21: 25 ug via INTRAVENOUS
  Administered 2017-10-21: 50 ug via INTRAVENOUS

## 2017-10-21 MED ORDER — BUPIVACAINE HCL (PF) 0.5 % IJ SOLN
INTRAMUSCULAR | Status: AC
Start: 2017-10-21 — End: ?
  Filled 2017-10-21: qty 30

## 2017-10-21 MED ORDER — DIPHENHYDRAMINE HCL 12.5 MG/5ML PO ELIX
12.5000 mg | ORAL_SOLUTION | Freq: Four times a day (QID) | ORAL | Status: DC | PRN
  Filled 2017-10-21: qty 5

## 2017-10-21 MED ORDER — FENTANYL CITRATE (PF) 250 MCG/5ML IJ SOLN
INTRAMUSCULAR | Status: AC
  Filled 2017-10-21: qty 5

## 2017-10-21 MED ORDER — MIDAZOLAM HCL 5 MG/5ML IJ SOLN
INTRAMUSCULAR | Status: DC | PRN
  Administered 2017-10-21 (×2): .5 mg via INTRAVENOUS

## 2017-10-21 MED ORDER — VANCOMYCIN HCL IN DEXTROSE 1-5 GM/200ML-% IV SOLN: 1000.0000 mg | INTRAVENOUS | Status: DC

## 2017-10-21 MED ORDER — ACETAMINOPHEN 160 MG/5ML PO SOLN
1000.0000 mg | Freq: Four times a day (QID) | ORAL | Status: AC
  Administered 2017-10-21: 1000 mg via ORAL
  Administered 2017-10-23: 650 mg via ORAL
  Filled 2017-10-21 (×3): qty 40.6

## 2017-10-21 MED ORDER — ROCURONIUM BROMIDE 100 MG/10ML IV SOLN
INTRAVENOUS | Status: DC | PRN
  Administered 2017-10-21: 60 mg via INTRAVENOUS
  Administered 2017-10-21: 20 mg via INTRAVENOUS

## 2017-10-21 MED ORDER — FENTANYL 40 MCG/ML IV SOLN
INTRAVENOUS | Status: DC
  Administered 2017-10-21: 80 ug via INTRAVENOUS
  Administered 2017-10-21: 1000 ug via INTRAVENOUS
  Administered 2017-10-22: 110 ug via INTRAVENOUS
  Administered 2017-10-22: 70 ug via INTRAVENOUS
  Administered 2017-10-22: 160 ug via INTRAVENOUS
  Administered 2017-10-22: 100 ug via INTRAVENOUS
  Administered 2017-10-22: 30 ug via INTRAVENOUS
  Administered 2017-10-22: 120 ug via INTRAVENOUS
  Administered 2017-10-23: 50 ug via INTRAVENOUS
  Administered 2017-10-23: 80 ug via INTRAVENOUS
  Administered 2017-10-23: 60 ug via INTRAVENOUS
  Administered 2017-10-23: 50 ug via INTRAVENOUS
  Administered 2017-10-24: 120 ug via INTRAVENOUS
  Administered 2017-10-24: 80 ug via INTRAVENOUS
  Administered 2017-10-24: 140 ug via INTRAVENOUS
  Administered 2017-10-24 (×2): 0 ug via INTRAVENOUS
  Administered 2017-10-25: 1000 ug via INTRAVENOUS
  Administered 2017-10-25: 60 ug via INTRAVENOUS
  Administered 2017-10-25: 30 ug via INTRAVENOUS
  Administered 2017-10-25: 60 ug via INTRAVENOUS
  Administered 2017-10-25: 10 ug via INTRAVENOUS
  Administered 2017-10-26: 160 ug via INTRAVENOUS
  Administered 2017-10-26: 30 ug via INTRAVENOUS
  Administered 2017-10-26 (×2): 100 ug via INTRAVENOUS
  Administered 2017-10-26: 0 ug via INTRAVENOUS
  Administered 2017-10-27: 40 ug via INTRAVENOUS
  Administered 2017-10-27: 80 ug via INTRAVENOUS
  Administered 2017-10-27: 150 ug via INTRAVENOUS
  Administered 2017-10-27: 1000 ug via INTRAVENOUS
  Administered 2017-10-28: 110 ug via INTRAVENOUS
  Administered 2017-10-28: 0 ug via INTRAVENOUS
  Administered 2017-10-28: 130 ug via INTRAVENOUS
  Administered 2017-10-28: 50 ug via INTRAVENOUS
  Administered 2017-10-28: 140 ug via INTRAVENOUS
  Administered 2017-10-28: 20 ug via INTRAVENOUS
  Administered 2017-10-29: 90 ug via INTRAVENOUS
  Administered 2017-10-29: 70 ug via INTRAVENOUS
  Administered 2017-10-29: 30 ug via INTRAVENOUS
  Administered 2017-10-29: 40 ug via INTRAVENOUS
  Administered 2017-10-29: 60 ug via INTRAVENOUS
  Administered 2017-10-29: 20 ug via INTRAVENOUS
  Administered 2017-10-30: 0 ug via INTRAVENOUS
  Administered 2017-10-30: 30 ug via INTRAVENOUS
  Administered 2017-10-30: 80 ug via INTRAVENOUS
  Administered 2017-10-30: 70 ug via INTRAVENOUS
  Administered 2017-10-30: 16:00:00 via INTRAVENOUS
  Administered 2017-10-30: 70 ug via INTRAVENOUS
  Administered 2017-10-30: 100 ug via INTRAVENOUS
  Administered 2017-10-31: 80 ug via INTRAVENOUS
  Administered 2017-10-31: 10 ug via INTRAVENOUS
  Administered 2017-10-31: 60 ug via INTRAVENOUS
  Administered 2017-10-31: 30 ug via INTRAVENOUS
  Administered 2017-10-31 (×2): 20 ug via INTRAVENOUS
  Administered 2017-10-31: 30 ug via INTRAVENOUS
  Administered 2017-11-01: 70 ug via INTRAVENOUS
  Administered 2017-11-01: 30 ug via INTRAVENOUS
  Administered 2017-11-01: 100 ug via INTRAVENOUS
  Administered 2017-11-01: 20 ug via INTRAVENOUS
  Administered 2017-11-01: 90 ug via INTRAVENOUS
  Administered 2017-11-01: 80 ug via INTRAVENOUS
  Administered 2017-11-02: 20 ug via INTRAVENOUS
  Administered 2017-11-02: 90 ug via INTRAVENOUS
  Administered 2017-11-02: 70 ug via INTRAVENOUS
  Administered 2017-11-02: 30 ug via INTRAVENOUS
  Administered 2017-11-02: 100 ug via INTRAVENOUS
  Administered 2017-11-03: 190 ug via INTRAVENOUS
  Administered 2017-11-03: 03:00:00 via INTRAVENOUS
  Administered 2017-11-03: 70 ug via INTRAVENOUS
  Administered 2017-11-03: 128 ug via INTRAVENOUS
  Administered 2017-11-04: 70 ug via INTRAVENOUS
  Administered 2017-11-04: 170 ug via INTRAVENOUS
  Filled 2017-10-21 (×6): qty 25

## 2017-10-21 MED ORDER — ONDANSETRON HCL 4 MG/2ML IJ SOLN
4.0000 mg | Freq: Four times a day (QID) | INTRAMUSCULAR | Status: DC | PRN
  Administered 2017-10-21 – 2017-10-27 (×4): 4 mg via INTRAVENOUS
  Filled 2017-10-21 (×5): qty 2

## 2017-10-21 MED ORDER — BISACODYL 5 MG PO TBEC
10.0000 mg | DELAYED_RELEASE_TABLET | Freq: Every day | ORAL | Status: DC
  Administered 2017-10-23: 10 mg via ORAL
  Filled 2017-10-21 (×2): qty 2

## 2017-10-21 MED ORDER — DEXTROSE-NACL 5-0.9 % IV SOLN
INTRAVENOUS | Status: DC
  Administered 2017-10-21: 18:00:00 via INTRAVENOUS

## 2017-10-21 MED ORDER — SUGAMMADEX SODIUM 200 MG/2ML IV SOLN
INTRAVENOUS | Status: DC | PRN
  Administered 2017-10-21: 111.6 mg via INTRAVENOUS

## 2017-10-21 MED ORDER — TRAVASOL 10 % IV SOLN
INTRAVENOUS | Status: AC
  Administered 2017-10-21: 18:00:00 via INTRAVENOUS
  Filled 2017-10-21: qty 672

## 2017-10-21 MED ORDER — OXYCODONE HCL 5 MG PO TABS
5.0000 mg | ORAL_TABLET | ORAL | Status: DC | PRN
  Filled 2017-10-21 (×2): qty 2

## 2017-10-21 MED ORDER — HYDROMORPHONE HCL 1 MG/ML IJ SOLN
0.2500 mg | INTRAMUSCULAR | Status: DC | PRN
  Administered 2017-10-21 (×4): 0.5 mg via INTRAVENOUS

## 2017-10-21 MED ORDER — 0.9 % SODIUM CHLORIDE (POUR BTL) OPTIME
TOPICAL | Status: DC | PRN
  Administered 2017-10-21: 3000 mL

## 2017-10-21 MED ORDER — PROPOFOL 10 MG/ML IV BOLUS
INTRAVENOUS | Status: DC | PRN
  Administered 2017-10-21: 80 mg via INTRAVENOUS

## 2017-10-21 MED ORDER — HYDROMORPHONE HCL 1 MG/ML IJ SOLN
INTRAMUSCULAR | Status: AC
  Filled 2017-10-21: qty 2

## 2017-10-21 MED ORDER — ONDANSETRON HCL 4 MG/2ML IJ SOLN: 4.0000 mg | Freq: Four times a day (QID) | INTRAMUSCULAR | Status: DC | PRN

## 2017-10-21 SURGICAL SUPPLY — 88 items
ADH SKN CLS APL DERMABOND .7 (GAUZE/BANDAGES/DRESSINGS) ×1
APL SRG 22X2 LUM MLBL SLNT (VASCULAR PRODUCTS)
APPLICATOR TIP EXT COSEAL (VASCULAR PRODUCTS) IMPLANT
BAG SPEC RTRVL LRG 6X4 10 (ENDOMECHANICALS)
CANISTER SUCT 3000ML PPV (MISCELLANEOUS) ×3 IMPLANT
CATH KIT ON Q 5IN SLV (PAIN MANAGEMENT) IMPLANT
CATH ROBINSON RED A/P 18FR (CATHETERS) ×2 IMPLANT
CATH THORACIC 28FR (CATHETERS) ×2 IMPLANT
CATH THORACIC 28FR RT ANG (CATHETERS) IMPLANT
CATH THORACIC 36FR (CATHETERS) IMPLANT
CATH THORACIC 36FR RT ANG (CATHETERS) IMPLANT
CLIP VESOCCLUDE MED 6/CT (CLIP) ×3 IMPLANT
CONN ST 1/4X3/8  BEN (MISCELLANEOUS)
CONN ST 1/4X3/8 BEN (MISCELLANEOUS) IMPLANT
CONN Y 3/8X3/8X3/8  BEN (MISCELLANEOUS)
CONN Y 3/8X3/8X3/8 BEN (MISCELLANEOUS) IMPLANT
CONT SPEC 4OZ CLIKSEAL STRL BL (MISCELLANEOUS) ×10 IMPLANT
COVER SURGICAL LIGHT HANDLE (MISCELLANEOUS) ×1 IMPLANT
CUTTER ECHEON FLEX ENDO 45 340 (ENDOMECHANICALS) ×2 IMPLANT
DERMABOND ADVANCED (GAUZE/BANDAGES/DRESSINGS) ×2
DERMABOND ADVANCED .7 DNX12 (GAUZE/BANDAGES/DRESSINGS) IMPLANT
DRAIN CHANNEL 28F RND 3/8 FF (WOUND CARE) IMPLANT
DRAIN CHANNEL 32F RND 10.7 FF (WOUND CARE) IMPLANT
DRAPE LAPAROSCOPIC ABDOMINAL (DRAPES) ×3 IMPLANT
DRAPE WARM FLUID 44X44 (DRAPE) ×1 IMPLANT
ELECT BLADE 6.5 EXT (BLADE) ×3 IMPLANT
ELECT REM PT RETURN 9FT ADLT (ELECTROSURGICAL) ×3
ELECTRODE REM PT RTRN 9FT ADLT (ELECTROSURGICAL) ×1 IMPLANT
FELT TEFLON 1X6 (MISCELLANEOUS) ×2 IMPLANT
GAUZE SPONGE 4X4 12PLY STRL (GAUZE/BANDAGES/DRESSINGS) ×3 IMPLANT
GAUZE SPONGE 4X4 12PLY STRL LF (GAUZE/BANDAGES/DRESSINGS) ×6 IMPLANT
GLOVE SURG SIGNA 7.5 PF LTX (GLOVE) ×6 IMPLANT
GOWN STRL REUS W/ TWL LRG LVL3 (GOWN DISPOSABLE) ×2 IMPLANT
GOWN STRL REUS W/ TWL XL LVL3 (GOWN DISPOSABLE) ×1 IMPLANT
GOWN STRL REUS W/TWL LRG LVL3 (GOWN DISPOSABLE) ×6
GOWN STRL REUS W/TWL XL LVL3 (GOWN DISPOSABLE) ×3
HANDLE STAPLE ENDO GIA SHORT (STAPLE)
HEMOSTAT SURGICEL 2X14 (HEMOSTASIS) IMPLANT
KIT BASIN OR (CUSTOM PROCEDURE TRAY) ×3 IMPLANT
KIT ROOM TURNOVER OR (KITS) ×3 IMPLANT
NDL SPNL 18GX3.5 QUINCKE PK (NEEDLE) IMPLANT
NEEDLE SPNL 18GX3.5 QUINCKE PK (NEEDLE) ×3 IMPLANT
NS IRRIG 1000ML POUR BTL (IV SOLUTION) ×9 IMPLANT
PACK CHEST (CUSTOM PROCEDURE TRAY) ×3 IMPLANT
PAD ARMBOARD 7.5X6 YLW CONV (MISCELLANEOUS) ×6 IMPLANT
POUCH ENDO CATCH II 15MM (MISCELLANEOUS) IMPLANT
POUCH SPECIMEN RETRIEVAL 10MM (ENDOMECHANICALS) IMPLANT
RELOAD STAPLE 45 GOLD REG/THCK (STAPLE) IMPLANT
SEALANT PROGEL (MISCELLANEOUS) IMPLANT
SEALANT SURG COSEAL 4ML (VASCULAR PRODUCTS) IMPLANT
SEALANT SURG COSEAL 8ML (VASCULAR PRODUCTS) IMPLANT
SOLUTION ANTI FOG 6CC (MISCELLANEOUS) ×3 IMPLANT
SPONGE INTESTINAL PEANUT (DISPOSABLE) ×2 IMPLANT
SPONGE TONSIL 1 RF SGL (DISPOSABLE) ×3 IMPLANT
STAPLE RELOAD 45MM GOLD (STAPLE) ×9 IMPLANT
STAPLER ENDO GIA 12 SHRT THIN (STAPLE) IMPLANT
STAPLER ENDO GIA 12MM SHORT (STAPLE) IMPLANT
STAPLER VISISTAT 35W (STAPLE) ×2 IMPLANT
SUT PROLENE 4 0 RB 1 (SUTURE) ×3
SUT PROLENE 4-0 RB1 .5 CRCL 36 (SUTURE) IMPLANT
SUT SILK  1 MH (SUTURE) ×4
SUT SILK 1 MH (SUTURE) ×2 IMPLANT
SUT SILK 1 TIES 10X30 (SUTURE) ×3 IMPLANT
SUT SILK 2 0SH CR/8 30 (SUTURE) IMPLANT
SUT SILK 3 0SH CR/8 30 (SUTURE) IMPLANT
SUT VIC AB 1 CTX 36 (SUTURE)
SUT VIC AB 1 CTX36XBRD ANBCTR (SUTURE) IMPLANT
SUT VIC AB 2-0 CTX 36 (SUTURE) IMPLANT
SUT VIC AB 3-0 MH 27 (SUTURE) IMPLANT
SUT VIC AB 3-0 SH 27 (SUTURE) ×3
SUT VIC AB 3-0 SH 27X BRD (SUTURE) IMPLANT
SUT VIC AB 3-0 X1 27 (SUTURE) IMPLANT
SUT VICRYL 2 TP 1 (SUTURE) IMPLANT
SYR 10ML LL (SYRINGE) IMPLANT
SYR 20CC LL (SYRINGE) ×2 IMPLANT
SYSTEM SAHARA CHEST DRAIN RE-I (WOUND CARE) ×3 IMPLANT
TAPE CLOTH 4X10 WHT NS (GAUZE/BANDAGES/DRESSINGS) ×3 IMPLANT
TAPE CLOTH SURG 4X10 WHT LF (GAUZE/BANDAGES/DRESSINGS) ×6 IMPLANT
TIP APPLICATOR SPRAY EXTEND 16 (VASCULAR PRODUCTS) IMPLANT
TOWEL GREEN STERILE (TOWEL DISPOSABLE) ×12 IMPLANT
TOWEL GREEN STERILE FF (TOWEL DISPOSABLE) ×6 IMPLANT
TOWEL OR 17X24 6PK STRL BLUE (TOWEL DISPOSABLE) ×6 IMPLANT
TOWEL OR 17X26 10 PK STRL BLUE (TOWEL DISPOSABLE) ×6 IMPLANT
TRAP SPECIMEN MUCOUS 40CC (MISCELLANEOUS) ×4 IMPLANT
TRAY FOLEY W/METER SILVER 16FR (SET/KITS/TRAYS/PACK) ×3 IMPLANT
TROCAR XCEL BLADELESS 5X75MML (TROCAR) ×3 IMPLANT
TUNNELER SHEATH ON-Q 11GX8 DSP (PAIN MANAGEMENT) IMPLANT
WATER STERILE IRR 1000ML POUR (IV SOLUTION) ×3 IMPLANT

## 2017-10-21 NOTE — Progress Notes (Signed)
Pt in OR today with CTS today. Dr. Annye English will be covering me in my absence thru -11/19.

## 2017-10-21 NOTE — Transfer of Care (Signed)
Immediate Anesthesia Transfer of Care Note  Patient: Danielle [name]  Procedure(s) Performed: LEFT VIDEO ASSISTED THORACOSCOPY (VATS) for MEDISTINAL DRAINAGE (Left Chest)  Patient Location: PACU  Anesthesia Type:General  Level of Consciousness: awake, alert , patient cooperative and responds to stimulation  Airway & Oxygen Therapy: Patient Spontanous Breathing and Patient connected to face mask oxygen  Post-op Assessment: Report given to RN, Post -op Vital signs reviewed and stable and Patient moving all extremities X 4  Post vital signs: Reviewed and stable  Last Vitals:  Vitals:   10/11/2017 0749 10/09/2017 0750  BP:    Pulse: 94 93  Resp: (!) 28 20  Temp:    SpO2: 100% 100%    Last Pain:  Vitals:   10/27/2017 0300  TempSrc: Oral  PainSc:       Patients Stated Pain Goal: 0 (75/91/63 8466)  Complications: No apparent anesthesia complications

## 2017-10-21 NOTE — Anesthesia Procedure Notes (Signed)
Arterial Line Insertion Start/End11/11/2017 7:10 AM, 10/07/2017 7:15 AM Performed by: Josephine Igo, CRNA  Patient location: Pre-op. Preanesthetic checklist: patient identified, IV checked, site marked, risks and benefits discussed, surgical consent, monitors and equipment checked, pre-op evaluation, timeout performed and anesthesia consent Lidocaine 1% used for infiltration Left, radial was placed Catheter size: 20 G  Attempts: 1 Procedure performed without using ultrasound guided technique. Following insertion, Biopatch and dressing applied. Post procedure assessment: normal  Patient tolerated the procedure well with no immediate complications.

## 2017-10-21 NOTE — Progress Notes (Signed)
[name] TEAM 1 - Stepdown/ICU TEAM  Danielle [name]  ZOX:[id number] DOB: 08/04/1946 DOA: 10/08/2017 PCP: Danielle Beard, MD    Brief Narrative:  71 y.o.F w/ a Hx of Crohn's disease, hiatal hernia, psoriasis, and SBO who underwent a type IV hiatal hernia repair in October 2018. Postoperatively the patient required endotracheal intubation, mechanical ventilation and the use of pressors. She was extubated on postop day #2. She improved clinically and was on parenteral nutrition, however she developed a pelvic fluid collection which required a drain. On 09/11/2017 she had blood cultures positive for Candida glabrata, and the pelvic abscess culture was positive for C. Albicans and C. Glabrata. She was given treatment withparenteral antifungals. She subsequently improved, was able to ambulate with physical therapy, weaned off of parenteral nutrition and was tolerating oral diet. ID recommended to continue Invanz andErasisfor 2 weeks. A PICC line was placed in her right arm and she was ultimately discharged home.  She returned on 10/01/2017 due to intermittent fevers. She was treated empirically and clinically improved to the point that she was able to be d/c home again on 11/1.  She returned to the ED 11/8 with complaints of persistent nausea associated with emesis.   Significant Events: 11/8 readmit 11/11 PICC placed 11/12 thoracentesis 11/5 to OR at [facility] for L VATS for mediastinal drainage   Subjective: Pt is sedate post-op in her room on the SDU.  She is comfortable and not in any resp distress.    Assessment & Plan:  Severe Sepsis/Mediastinal abscess -CT with reaccumulation of fluid collection in the lower mediastinum. Esophagram showing 2.5 cm area of extrinsic compression upon the esophagus. Possible smaller abscesses posterior margin spleen See results below -11/12 S/P LEFT Thoracentesis: 780 mL fluid removed: Exudative, Gram stain with WBCs and PMNs -Per Dr. Zenovia Jarred GI note  from 11/9 he reviewed CT scan abdomen pelvis discussed with Dr. Ardis Hughs both agree mediastinal abscess complex not amenable to endoscopic drainage. Recommended cardiothoracic surgery be consulted. -IR consulted: Thoracentesis completed. Per review of EMR not candidate for percutaneous drain placement. If drain felt necessary consider tertiary care center. -transferred to [facility] to OR for VATS drainage today - immediate post-op care per TCTS, but pt to remain on [place] started per Surgery  Pyuria -11/14 Urine culture still pending  Anemia -likely primarily due to severe acute/subacute illness - Fe is quite low, but will avoid loading w/ IV Fe until clear no bacteremia   Chronic diastolic CHF -follow Is/Os and daily weights   Essential HTN  RUE DVT -resume anticoag at discretion of TCTS  History of Crohns/ chronic diarrhea   Hypokalemia Replace as needed to goal of 4.0  DVT prophylaxis: SCDs Code Status: FULL CODE Family Communication: no family present at time of exam  Disposition Plan: SDU  Consultants:  TCTS ID Gen Surgery IR  Antimicrobials:  Zosyn 11/8 > Anidulafungin 11/9 > Vanc 11/8 > 11/11 + 11/15 >  Objective: Blood pressure 127/75, pulse 97, temperature (!) 97.4 F (36.3 C), temperature source Axillary, resp. rate (!) 21, height 5' 4.5" (1.638 m), weight 55.8 kg (123 lb 0.3 oz), SpO2 100 %.  Intake/Output Summary (Last 24 hours) at 11/01/2017 1903 Last data filed at 10/22/2017 1545 Gross per 24 hour  Intake 2348.42 ml  Output 2151 ml  Net 197.42 ml   Filed Weights   10/15/17 1449 10/20/17 1600 10/24/2017 0500  Weight: 55.9 kg (123 lb 3.8 oz) 55.3 kg (121 lb 14.6  oz) 55.8 kg (123 lb 0.3 oz)    Examination: General: No acute respiratory distress Lungs: No wheezes or crackles Cardiovascular: Regular rate and rhythm   CBC: Recent Labs  Lab 10/15/17 0400  10/17/17 1126 10/18/17 0542 10/19/17 0500 10/19/17 1458  10/20/17 0418 10/11/2017 0321  WBC 22.7*   < > 28.7* 24.1* 24.3*  --  24.2* 25.6*  NEUTROABS 17.0*  --  18.6* 15.7*  --   --   --   --   HGB 8.2*   < > 8.6* 8.0* 7.6* 7.9* 7.6* 8.6*  HCT 25.3*   < > 26.2* 24.4* 22.9* 24.4* 23.3* 26.0*  MCV 86.6   < > 86.2 89.4 86.4  --  86.3 85.5  PLT 359   < > 444* 384 360  --  362 328   < > = values in this interval not displayed.   Basic Metabolic Panel: Recent Labs  Lab 10/17/17 1126 10/18/17 0542 10/19/17 0500 10/20/17 0418 10/30/2017 0321  NA 134* 132* 136 135 134*  K 4.4 4.0 2.9* 3.4* 3.4*  CL 114* 110 112* 112* 109  CO2 13* 16* 19* 20* 19*  GLUCOSE 75 444* 119* 109* 119*  BUN 5* 7 8 10 11   CREATININE 1.01* 0.97 0.94 0.85 0.87  CALCIUM 7.4* 7.4* 7.2* 7.6* 7.5*  MG 2.0 2.0 1.4* 2.4 1.8  PHOS 2.5 3.5 2.3* 3.1 2.3*   GFR: Estimated Creatinine Clearance: 53.8 mL/min (by C-G formula based on SCr of 0.87 mg/dL).  Liver Function Tests: Recent Labs  Lab 10/17/17 1126 10/18/17 0542 10/19/17 0500 10/20/17 0418 10/30/2017 0321  AST 23 20 45* 117* 60*  ALT 14 12* 20 50 37  ALKPHOS 71 60 77 109 112  BILITOT 1.0 0.3 0.8 0.5 0.8  PROT 6.3* 5.7* 5.7* 6.1* 6.0*  ALBUMIN 1.8* 1.6* 1.6* 1.6* 1.5*    Coagulation Profile: Recent Labs  Lab 10/15/17 0400  INR 1.52    HbA1C: Hgb A1c MFr Bld  Date/Time Value Ref Range Status  09/09/2017 06:00 PM 5.0 4.8 - 5.6 % Final    Comment:    (NOTE) Pre diabetes:          5.7%-6.4% Diabetes:              >6.4% Glycemic control for   <7.0% adults with diabetes   04/25/2017 09:32 AM 5.4 4.8 - 5.6 % Final    Comment:    (NOTE)         Pre-diabetes: 5.7 - 6.4         Diabetes: >6.4         Glycemic control for adults with diabetes: <7.0     CBG: Recent Labs  Lab 10/20/17 0754 10/20/17 1206 10/20/17 1638 10/20/17 2211 11/03/2017 1649  GLUCAP 97 88 102* 93 163*    Recent Results (from the past 240 hour(s))  Culture, blood (Routine x 2)     Status: None   Collection Time: 11/01/2017  2:05  PM  Result Value Ref Range Status   Specimen Description BLOOD LEFT FOREARM  Final   Special Requests   Final    BOTTLES DRAWN AEROBIC AND ANAEROBIC Blood Culture results may not be optimal due to an inadequate volume of blood received in culture bottles   Culture   Final    NO GROWTH 5 DAYS Performed at [facility], [place] [address], [place], [place] [address]    Report Status 10/19/2017 FINAL  Final  MRSA PCR Screening  Status: None   Collection Time: 10/24/2017 10:45 PM  Result Value Ref Range Status   MRSA by PCR NEGATIVE NEGATIVE Final    Comment:        The GeneXpert MRSA Assay (FDA approved for NASAL specimens only), is one component of a comprehensive MRSA colonization surveillance program. It is not intended to diagnose MRSA infection nor to guide or monitor treatment for MRSA infections.   Culture, blood (Routine x 2)     Status: None   Collection Time: 10/16/2017 11:28 PM  Result Value Ref Range Status   Specimen Description BLOOD LEFT HAND  Final   Special Requests IN PEDIATRIC BOTTLE Blood Culture adequate volume  Final   Culture   Final    NO GROWTH 5 DAYS Performed at [facility], [place] [address], [place], [place] [address]    Report Status 10/20/2017 FINAL  Final  C difficile quick scan w PCR reflex     Status: None   Collection Time: 10/16/17  2:47 PM  Result Value Ref Range Status   C Diff antigen NEGATIVE NEGATIVE Final   C Diff toxin NEGATIVE NEGATIVE Final   C Diff interpretation No C. difficile detected.  Final  Gastrointestinal Panel by PCR , Stool     Status: None   Collection Time: 10/16/17  2:48 PM  Result Value Ref Range Status   Campylobacter species NOT DETECTED NOT DETECTED Final   Plesimonas shigelloides NOT DETECTED NOT DETECTED Final   Salmonella species NOT DETECTED NOT DETECTED Final   Yersinia enterocolitica NOT DETECTED NOT DETECTED Final   Vibrio species NOT DETECTED NOT DETECTED Final   Vibrio cholerae NOT DETECTED NOT  DETECTED Final   Enteroaggregative E coli (EAEC) NOT DETECTED NOT DETECTED Final   Enteropathogenic E coli (EPEC) NOT DETECTED NOT DETECTED Final   Enterotoxigenic E coli (ETEC) NOT DETECTED NOT DETECTED Final   Shiga like toxin producing E coli (STEC) NOT DETECTED NOT DETECTED Final   Shigella/Enteroinvasive E coli (EIEC) NOT DETECTED NOT DETECTED Final   Cryptosporidium NOT DETECTED NOT DETECTED Final   Cyclospora cayetanensis NOT DETECTED NOT DETECTED Final   Entamoeba histolytica NOT DETECTED NOT DETECTED Final   Giardia lamblia NOT DETECTED NOT DETECTED Final   Adenovirus F40/41 NOT DETECTED NOT DETECTED Final   Astrovirus NOT DETECTED NOT DETECTED Final   Norovirus GI/GII NOT DETECTED NOT DETECTED Final   Rotavirus A NOT DETECTED NOT DETECTED Final   Sapovirus (I, II, IV, and V) NOT DETECTED NOT DETECTED Final  Culture, fungus without smear     Status: None (Preliminary result)   Collection Time: 10/18/17  3:19 PM  Result Value Ref Range Status   Specimen Description Pleural, L  Final   Special Requests NONE  Final   Culture   Final    NO FUNGUS ISOLATED AFTER 3 DAYS Performed at [facility], [address], [place], [place] [address]    Report Status PENDING  Incomplete  Culture, body fluid-bottle     Status: None (Preliminary result)   Collection Time: 10/18/17  3:19 PM  Result Value Ref Range Status   Specimen Description FLUID LEFT PLEURAL  Final   Special Requests BOTTLES DRAWN AEROBIC AND ANAEROBIC 10CC  Final   Culture   Final    NO GROWTH 3 DAYS Performed at [facility], [place] [address] [place], [place] [address]    Report Status PENDING  Incomplete  Gram stain     Status: None  Collection Time: 10/18/17  3:19 PM  Result Value Ref Range Status   Specimen Description FLUID LEFT PLEURAL  Final   Special Requests NONE  Final   Gram Stain   Final    ABUNDANT WBC PRESENT,BOTH PMN AND MONONUCLEAR NO ORGANISMS SEEN Performed at [facility],  [address] [place], [place] [address]    Report Status 10/18/2017 FINAL  Final  Culture, blood (routine x 2)     Status: None (Preliminary result)   Collection Time: 10/19/17  9:45 AM  Result Value Ref Range Status   Specimen Description BLOOD BLOOD RIGHT FOREARM  Final   Special Requests IN PEDIATRIC BOTTLE Blood Culture adequate volume  Final   Culture   Final    NO GROWTH 2 DAYS Performed at [facility], [place] [address] [place], [place] [address]    Report Status PENDING  Incomplete  Culture, blood (routine x 2)     Status: None (Preliminary result)   Collection Time: 10/19/17  9:48 AM  Result Value Ref Range Status   Specimen Description BLOOD RIGHT HAND  Final   Special Requests IN PEDIATRIC BOTTLE Blood Culture adequate volume  Final   Culture   Final    NO GROWTH 2 DAYS Performed at [facility], [place] [address] [place], [place] [address]    Report Status PENDING  Incomplete  Surgical PCR screen     Status: None   Collection Time: 11/04/2017  3:21 AM  Result Value Ref Range Status   MRSA, PCR NEGATIVE NEGATIVE Final   Staphylococcus aureus NEGATIVE NEGATIVE Final    Comment: (NOTE) The Xpert SA Assay (FDA approved for NASAL specimens in patients 81 years of age and older), is one component of a comprehensive surveillance program. It is not intended to diagnose infection nor to guide or monitor treatment.   Body fluid culture     Status: None (Preliminary result)   Collection Time: 11/03/2017  9:02 AM  Result Value Ref Range Status   Specimen Description FLUID  Final   Special Requests MEDISTINAL PATIENT ON FOLLOWING ZOSYN AND VANC  Final   Gram Stain   Final    MODERATE WBC PRESENT,BOTH PMN AND MONONUCLEAR NO ORGANISMS SEEN    Culture PENDING  Incomplete   Report Status PENDING  Incomplete  Body fluid culture     Status: None (Preliminary result)   Collection Time: 10/20/2017  9:02 AM  Result Value Ref Range Status   Specimen Description FLUID PLEURAL  LEFT  Final   Special Requests PATIENT ON FOLLOWING ZOSYN AND VANC  Final   Gram Stain   Final    MODERATE WBC PRESENT, PREDOMINANTLY MONONUCLEAR NO ORGANISMS SEEN    Culture PENDING  Incomplete   Report Status PENDING  Incomplete     Scheduled Meds: . acetaminophen  1,000 mg Oral Q6H   Or  . acetaminophen (TYLENOL) oral liquid 160 mg/5 mL  1,000 mg Oral Q6H  . [START ON 10/22/2017] bisacodyl  10 mg Oral Daily  . fentaNYL   Intravenous Q4H  . HYDROmorphone      . lip balm  1 application Topical BID  . senna-docusate  1 tablet Oral QHS     LOS: 7 days   Cherene Altes, MD [facility] Office  [phone] Pager - Text Page per Amion as per below:  On-Call/Text Page:      [email or web address]      password [email or web address]  If 7PM-7AM, please contact  night-coverage [email or web address] Password [email or web address] 10/27/2017, 7:03 PM

## 2017-10-21 NOTE — Anesthesia Postprocedure Evaluation (Signed)
Anesthesia Post Note  Patient: NIDHI JACOME  Procedure(s) Performed: LEFT VIDEO ASSISTED THORACOSCOPY (VATS) for MEDISTINAL DRAINAGE (Left Chest)     Patient location during evaluation: PACU Anesthesia Type: General Level of consciousness: awake and sedated Pain management: pain level controlled Vital Signs Assessment: post-procedure vital signs reviewed and stable Respiratory status: spontaneous breathing, nonlabored ventilation, respiratory function stable and patient connected to nasal cannula oxygen Cardiovascular status: blood pressure returned to baseline and stable Postop Assessment: no apparent nausea or vomiting Anesthetic complications: no    Last Vitals:  Vitals:   10/28/2017 1525 10/19/2017 1545  BP: 108/66   Pulse: 98 97  Resp: 17 (!) 21  Temp:    SpO2: 100% 100%    Last Pain:  Vitals:   11/03/2017 1545  TempSrc:   PainSc: Asleep                 [place],JAMES TERRILL

## 2017-10-21 NOTE — Brief Op Note (Addendum)
10/12/2017 - 10/10/2017  78:47 AM  PATIENT:  Madelynn Done Bulger  71 y.o. female  PRE-OPERATIVE DIAGNOSIS:  Mediastinal fluid collection  POST-OPERATIVE DIAGNOSIS:  Mediastinal abscess  PROCEDURE:  Procedure(s): LEFT VIDEO ASSISTED THORACOSCOPY (VATS) for MEDISTINAL DRAINAGE (Left)  SURGEON:  Surgeon(s) and Role:    * Melrose Nakayama, MD - Primary  PHYSICIAN ASSISTANT: WAYNE GOLD PA-C  ANESTHESIA:   general  EBL:  600 mL   BLOOD ADMINISTERED:none  DRAINS: 28 F CHEST TUBE AND 28 BLAKE   LOCAL MEDICATIONS USED:  NONE  SPECIMEN:  Source of Specimen:  ABSCESS FOR CULTURES  DISPOSITION OF SPECIMEN:  MICRO  COUNTS:  YES  TOURNIQUET:  * No tourniquets in log *  DICTATION: PENDING  PLAN OF CARE: Admit to inpatient   PATIENT DISPOSITION:  PACU - hemodynamically stable.   Delay start of Pharmacological VTE agent (>24hrs) due to surgical blood loss or risk of bleeding: yes  COMPLICATIONS: NO KNOWN

## 2017-10-21 NOTE — Anesthesia Preprocedure Evaluation (Addendum)
Anesthesia Evaluation  Patient identified by MRN, date of birth, ID band Patient awake    Reviewed: Allergy & Precautions, NPO status , Patient's Chart, lab work & pertinent test results  Airway Mallampati: II   Neck ROM: Full    Dental  (+) Edentulous Upper, Edentulous Lower   Pulmonary former smoker,  Very limited reserve   + rhonchi        Cardiovascular  Rhythm:Regular Rate:Normal  Has been recently septic and hypotensive   Neuro/Psych    GI/Hepatic hiatal hernia, GI surgery and complications since   Endo/Other    Renal/GU      Musculoskeletal   Abdominal   Peds  Hematology  (+) Blood dyscrasia, anemia ,   Anesthesia Other Findings   Reproductive/Obstetrics                           Anesthesia Physical Anesthesia Plan  ASA: IV  Anesthesia Plan: General   Post-op Pain Management:    Induction: Intravenous  PONV Risk Score and Plan: 4 or greater and Treatment may vary due to age or medical condition  Airway Management Planned: Double Lumen EBT  Additional Equipment: Arterial line  Intra-op Plan:   Post-operative Plan: Post-operative intubation/ventilation  Informed Consent: I have reviewed the patients History and Physical, chart, labs and discussed the procedure including the risks, benefits and alternatives for the proposed anesthesia with the patient or authorized representative who has indicated his/her understanding and acceptance.     Plan Discussed with:   Anesthesia Plan Comments:         Anesthesia Quick Evaluation

## 2017-10-21 NOTE — Anesthesia Procedure Notes (Signed)
Procedure Name: Intubation Date/Time: 10/28/2017 8:25 AM Performed by: Glynda Jaeger, CRNA Pre-anesthesia Checklist: Patient identified, Patient being monitored, Timeout performed, Emergency Drugs available and Suction available Patient Re-evaluated:Patient Re-evaluated prior to induction Oxygen Delivery Method: Circle System Utilized Preoxygenation: Pre-oxygenation with 100% oxygen Induction Type: IV induction Ventilation: Mask ventilation without difficulty Laryngoscope Size: Mac and 3 Grade View: Grade I Tube type: Oral Endobronchial tube: Double lumen EBT and Left and 37 Fr Number of attempts: 1 Airway Equipment and Method: Stylet Placement Confirmation: ETT inserted through vocal cords under direct vision,  positive ETCO2 and breath sounds checked- equal and bilateral Secured at: 21 cm Tube secured with: Tape Dental Injury: Teeth and Oropharynx as per pre-operative assessment

## 2017-10-21 NOTE — Interval H&P Note (Signed)
History and Physical Interval Note:  13/88/7195 9:74 AM  Danielle [name]  has presented today for surgery, with the diagnosis of medistinal fluid  The various methods of treatment have been discussed with the patient and family. After consideration of risks, benefits and other options for treatment, the patient has consented to  Procedure(s): VIDEO ASSISTED THORACOSCOPY (VATS) for MEDISTINAL DRAINAGE (N/A) as a surgical intervention .  The patient's history has been reviewed, patient examined, no change in status, stable for surgery.  I have reviewed the patient's chart and labs.  Questions were answered to the patient's satisfaction.     Melrose Nakayama

## 2017-10-21 NOTE — Progress Notes (Signed)
Danielle [name] NOTE   Pharmacy Consult for TPN Indication: Poor PO intake / unable to place repeat G-tube   Patient Measurements: Height: 5' 4.5" (163.8 cm) Weight: 123 lb 0.3 oz (55.8 kg) IBW/kg (Calculated) : 55.85 TPN AdjBW (KG): 59 Body mass index is 20.79 kg/m.  Assessment:  35 yoF re-admit 11/8 with N/V, loose stools (baseline w/Crohn's). Was hospitalized for a month in October and developed fungemia and treated with Invanz and anidulafungin. Re-admitted a week later with intermittent fevers and difficultly swallowing. CT chest showed recurrent mediastinal fluid abscess.  PMH: Crohns, HH, hx SBO  Significant events:  11/5 Left VATs to drain mediastinum   GI: Mediastinal abscess complex  Endo: CBGs < 180 on sensitive SSI  Insulin requirements in the past 24 hours: 1 unit regular insulin  Lytes: Na 134, K 3.4, CO2 19, Cl 119, Mg 1.8, Phos 2.3  Renal: SCr 0.87, UOP 0.2, +1.4 L /24 hr, +10.4L since admission.  Pulm: 6L simple mask  Cards: VSS  Hepatobil: AST/ALT 60/37, Tbili wnl  ID: WBC 25.6, Tm 102.6, on IV Zosyn and anidulafungin   Best Practices: SCDs TPN Access: PICC 11/11>> TPN start date: 11/11>>  Nutritional Goals (per RD recommendation on 11/14): Kcal:  1475-1770 (25-30 kcal/kg) Protein:  60-70 grams (1-1.2 grams/kg) Fluid:  >/= 1.6 L/day  Current Nutrition:  Clinimix E 5/15 at a goal rate of 55 ml/hr + 20% fat emulsion at 105ml/hr to provide: 66 g/day protein, 1465 Kcal/day.  Plan:  Switch from Clinimix to patient-specific TPN at at 70 mL/hr TPN provides 67 g of protein, 235 g of dextrose, and 50 g lipids providing 1572 total kcal, which meets ~100% of patient needs.  Dextrose infusion rate is 2.9 mg/kg/min Electrolytes in TPN: Cl: Ac ratio 1:2, Na 50 mEq/L, K 30 mEq/L, Mg 5 mEq/L, Phos 20 mmol/L AddMVI,trace elements in TPN Change sensitive SSI to Q 6 hours  Monitor TPN labs, recheck labs tomorrow,clinical  progress    Albertina Parr, PharmD., BCPS Clinical Pharmacist Pager [phone]

## 2017-10-21 NOTE — Anesthesia Procedure Notes (Signed)
Central Venous Catheter Insertion Performed by: Oleta Mouse, MD, anesthesiologist Start/End11/19/2018 7:14 AM, 10/08/2017 7:24 AM Patient location: Pre-op. Preanesthetic checklist: patient identified, IV checked, site marked, risks and benefits discussed, surgical consent, monitors and equipment checked, pre-op evaluation, timeout performed and anesthesia consent Lidocaine 1% used for infiltration and patient sedated Hand hygiene performed  and maximum sterile barriers used  Catheter size: 8 Fr Total catheter length 16. Central line was placed.Double lumen Procedure performed without using ultrasound guided technique. Attempts: 1 Following insertion, dressing applied, line sutured and Biopatch. Post procedure assessment: blood return through all ports, free fluid flow and no air  Patient tolerated the procedure well with no immediate complications.

## 2017-10-22 ENCOUNTER — Inpatient Hospital Stay (HOSPITAL_COMMUNITY): Payer: Medicare HMO

## 2017-10-22 ENCOUNTER — Encounter (HOSPITAL_COMMUNITY): Payer: Self-pay | Admitting: Thoracic Surgery (Cardiothoracic Vascular Surgery)

## 2017-10-22 DIAGNOSIS — I82409 Acute embolism and thrombosis of unspecified deep veins of unspecified lower extremity: Secondary | ICD-10-CM

## 2017-10-22 DIAGNOSIS — I82A12 Acute embolism and thrombosis of left axillary vein: Secondary | ICD-10-CM

## 2017-10-22 DIAGNOSIS — J9851 Mediastinitis: Secondary | ICD-10-CM

## 2017-10-22 LAB — BASIC METABOLIC PANEL
ANION GAP: 6 (ref 5–15)
BUN: 13 mg/dL (ref 6–20)
CALCIUM: 8 mg/dL — AB (ref 8.9–10.3)
CO2: 18 mmol/L — ABNORMAL LOW (ref 22–32)
Chloride: 111 mmol/L (ref 101–111)
Creatinine, Ser: 0.77 mg/dL (ref 0.44–1.00)
Glucose, Bld: 205 mg/dL — ABNORMAL HIGH (ref 65–99)
Potassium: 3.6 mmol/L (ref 3.5–5.1)
Sodium: 135 mmol/L (ref 135–145)

## 2017-10-22 LAB — CBC
HCT: 34 % — ABNORMAL LOW (ref 36.0–46.0)
HEMOGLOBIN: 11.1 g/dL — AB (ref 12.0–15.0)
MCH: 27.3 pg (ref 26.0–34.0)
MCHC: 32.6 g/dL (ref 30.0–36.0)
MCV: 83.5 fL (ref 78.0–100.0)
PLATELETS: 295 10*3/uL (ref 150–400)
RBC: 4.07 MIL/uL (ref 3.87–5.11)
RDW: 19 % — ABNORMAL HIGH (ref 11.5–15.5)
WBC: 26.8 10*3/uL — AB (ref 4.0–10.5)

## 2017-10-22 LAB — URINE CULTURE: Culture: NO GROWTH

## 2017-10-22 LAB — ACID FAST SMEAR (AFB)
ACID FAST SMEAR - AFSCU2: NEGATIVE
ACID FAST SMEAR - AFSCU2: NEGATIVE

## 2017-10-22 LAB — BLOOD GAS, ARTERIAL
Acid-base deficit: 7.1 mmol/L — ABNORMAL HIGH (ref 0.0–2.0)
Bicarbonate: 17.9 mmol/L — ABNORMAL LOW (ref 20.0–28.0)
FIO2: 21
O2 Saturation: 96.1 %
PATIENT TEMPERATURE: 98.6
PO2 ART: 87.8 mmHg (ref 83.0–108.0)
pCO2 arterial: 36.1 mmHg (ref 32.0–48.0)
pH, Arterial: 7.317 — ABNORMAL LOW (ref 7.350–7.450)

## 2017-10-22 LAB — MAGNESIUM: MAGNESIUM: 1.6 mg/dL — AB (ref 1.7–2.4)

## 2017-10-22 LAB — PHOSPHORUS: Phosphorus: 3 mg/dL (ref 2.5–4.6)

## 2017-10-22 LAB — HEPARIN LEVEL (UNFRACTIONATED): Heparin Unfractionated: 0.51 IU/mL (ref 0.30–0.70)

## 2017-10-22 LAB — ACID FAST SMEAR (AFB, MYCOBACTERIA)

## 2017-10-22 MED ORDER — OXYCODONE HCL 5 MG/5ML PO SOLN
5.0000 mg | ORAL | Status: DC | PRN
  Administered 2017-10-22 – 2017-10-26 (×2): 10 mg via ORAL
  Filled 2017-10-22 (×2): qty 10

## 2017-10-22 MED ORDER — TRAVASOL 10 % IV SOLN
INTRAVENOUS | Status: AC
  Administered 2017-10-22: 17:00:00 via INTRAVENOUS
  Filled 2017-10-22: qty 672

## 2017-10-22 MED ORDER — FENTANYL CITRATE (PF) 100 MCG/2ML IJ SOLN
25.0000 ug | Freq: Once | INTRAMUSCULAR | Status: AC
  Administered 2017-10-22: 25 ug via INTRAVENOUS
  Filled 2017-10-22: qty 2

## 2017-10-22 MED ORDER — HEPARIN (PORCINE) IN NACL 100-0.45 UNIT/ML-% IJ SOLN
800.0000 [IU]/h | INTRAMUSCULAR | Status: DC
  Administered 2017-10-22 – 2017-10-23 (×2): 1600 [IU]/h via INTRAVENOUS
  Filled 2017-10-22 (×3): qty 250

## 2017-10-22 MED ORDER — MAGNESIUM SULFATE 2 GM/50ML IV SOLN
2.0000 g | Freq: Once | INTRAVENOUS | Status: AC
  Administered 2017-10-22: 2 g via INTRAVENOUS
  Filled 2017-10-22: qty 50

## 2017-10-22 NOTE — Progress Notes (Signed)
[facility] NOTE   Pharmacy Consult for TPN Indication: Poor PO intake / unable to place repeat G-tube   Patient Measurements: Height: 5' 4.5" (163.8 cm) Weight: 123 lb 0.3 oz (55.8 kg) IBW/kg (Calculated) : 55.85 TPN AdjBW (KG): 59 Body mass index is 20.79 kg/m.  Assessment:  35 yoF re-admit 11/8 with N/V, loose stools (baseline w/Crohn's). Was hospitalized for a month in October and developed fungemia and treated with Invanz and anidulafungin. Re-admitted a week later with intermittent fevers and difficultly swallowing. CT chest showed recurrent mediastinal fluid abscess.  PMH: Crohns, HH, hx SBO  Significant events:  11/15 Left VATs to drain mediastinum   GI: Mediastinal abscess complex, clear liquid diet   Endo: CBGs 119-203 off SSI - recent CBG spikes may have been reactive to surgery. Has not required much insulin during admission.  Insulin requirements in the past 24 hours: 0 unit regular insulin  Lytes: Na 134>135, K 3.4>3.6, CO2 19>18, Cl 109>111, Mg 1.8>1.6, Phos 2.3  Renal: SCr 0.77, UOP 2 mL/kg/hr, -775 mL /24 hr, +9.6L since admission, also on D5 NS @ 50 mL/hr  Pulm:100% RA; Bilateral chest tubes put out 390 mL / 24 hours  Cards: VSS  Hepatobil: AST/ALT 60/37, Tbili wnl  ID: WBC 26.8, Afebrile, on IV Zosyn and anidulafungin   Best Practices: SCDs TPN Access: PICC 11/11>> TPN start date: 11/11>>  Nutritional Goals (per RD recommendation on 11/14): Kcal:  1475-1770 (25-30 kcal/kg) Protein:  60-70 grams (1-1.2 grams/kg) Fluid:  >/= 1.6 L/day  Current Nutrition:  Clinimix E 5/15 at a goal rate of 55 ml/hr + 20% fat emulsion at 93ml/hr to provide: 66 g/day protein, 1465 Kcal/day.  Plan:  Continue TPN at 70 mL/hr TPN provides 67 g of protein, 235 g of dextrose, and 50 g lipids providing 1572 total kcal, which meets ~100% of patient needs.  Magnesium sulfate 2 gm IV once  Dextrose infusion rate is 2.9  mg/kg/min Electrolytes in TPN: Cl:Ac ratio - Max acetate, Na 50 mEq/L, K 30 mEq/L, Mg 10 mEq/L, Phos 20 mmol/L AddMVI,trace elements in TPN Add SSI if blood glucose starts to trend back up  Monitor TPN labs, recheck labs tomorrow,clinical progress    Albertina Parr, PharmD., BCPS Clinical Pharmacist Pager [phone]

## 2017-10-22 NOTE — Progress Notes (Addendum)
[address]       [place] [address]             [phone]      1 Day Post-Op Procedure(s) (LRB): LEFT VIDEO ASSISTED THORACOSCOPY (VATS) for MEDISTINAL DRAINAGE (Left) Subjective: C/O left hand hurting  Objective: Vital signs in last 24 hours: Temp:  [97 F (36.1 C)-97.5 F (36.4 C)] 97.5 F (36.4 C) (11/16 0544) Pulse Rate:  [82-112] 83 (11/16 0400) Cardiac Rhythm: Normal sinus rhythm (11/16 0400) Resp:  [9-37] 14 (11/16 0424) BP: (104-137)/(59-77) 108/60 (11/16 0400) SpO2:  [95 %-100 %] 100 % (11/16 0424) Arterial Line BP: (88-149)/(43-62) 101/49 (11/16 0400)  Hemodynamic parameters for last 24 hours:    Intake/Output from previous day: 11/15 0701 - 11/16 0700 In: 3065 [P.O.:150; I.V.:2055; Blood:630; IV Piggyback:230] Out: 9417 [Urine:2650; Stool:200; Blood:600; Chest Tube:390] Intake/Output this shift: No intake/output data recorded.  General appearance: alert, cooperative, fatigued and no distress Heart: regular rate and rhythm Lungs: bibasilar crackles Abdomen: benign Extremities: no edema or calf tenderness Wound: dressings CDI  Lab Results: Recent Labs    10/07/2017 0321 10/22/17 0400  WBC 25.6* 26.8*  HGB 8.6* 11.1*  HCT 26.0* 34.0*  PLT 328 295   BMET:  Recent Labs    10/20/2017 0321 10/22/17 0400  NA 134* 135  K 3.4* 3.6  CL 109 111  CO2 19* 18*  GLUCOSE 119* 205*  BUN 11 13  CREATININE 0.87 0.77  CALCIUM 7.5* 8.0*    PT/INR: No results for input(s): LABPROT, INR in the last 72 hours. ABG    Component Value Date/Time   PHART 7.317 (L) 10/22/2017 0420   HCO3 17.9 (L) 10/22/2017 0420   TCO2 15 (L) 09/10/2017 1214   ACIDBASEDEF 7.1 (H) 10/22/2017 0420   O2SAT 96.1 10/22/2017 0420   CBG (last 3)  Recent Labs    10/20/17 1638 10/20/17 2211 10/17/2017 1649  GLUCAP 102* 93 163*    Meds Scheduled Meds: . acetaminophen  1,000 mg Oral Q6H   Or  . acetaminophen (TYLENOL) oral liquid 160 mg/5 mL  1,000 mg  Oral Q6H  . bisacodyl  10 mg Oral Daily  . fentaNYL   Intravenous Q4H  . lip balm  1 application Topical BID  . senna-docusate  1 tablet Oral QHS   Continuous Infusions: . anidulafungin Stopped (10/07/2017 1908)  . dextrose 5 % and 0.9% NaCl 50 mL/hr at 10/22/17 0400  . piperacillin-tazobactam (ZOSYN)  IV 3.375 g (10/22/17 0552)  . potassium chloride 10 mEq (10/22/17 0624)  . TPN ADULT (ION) 70 mL/hr at 10/22/17 0400   PRN Meds:.diphenhydrAMINE **OR** diphenhydrAMINE, hydrocortisone, naloxone **AND** sodium chloride flush, nystatin, ondansetron (ZOFRAN) IV, oxyCODONE, phenol, potassium chloride, traMADol  Xrays Dg Chest Port 1 View  Result Date: 10/07/2017 CLINICAL DATA:  71 year old female postop day zero VATS for possible mediastinal abscess. Status post ultrasound-guided left thoracentesis 3 days ago. EXAM: PORTABLE CHEST 1 VIEW COMPARISON:  0421 hours today and earlier. FINDINGS: Portable AP semi upright view at 1046 hours. There is a chest tube coursing to the central mediastinal contour. A second chest tube courses in the left lateral chest up to the left lung apex. No pneumothorax. Suspected small volume pneumomediastinum which is likely postoperative. The left upper extremity PICC line now appears to course to the right atrium level. There is a new left subclavian approach catheter or tube of unclear significance. Larger lung volumes. Overall stable mediastinal contour. Patchy perihilar and lung  base opacity has not significantly changed. Possible small right pleural effusion. Negative visible bowel gas pattern.  Stable cholecystectomy clips. IMPRESSION: 1. Mediastinal in left chest tube in place with no pneumothorax. Small postoperative pneumomediastinum suspected. Stable mediastinal contour. 2. Patchy perihilar and lung base opacity not significantly changed. 3. Left upper extremity PICC line catheter tip now at the right atrium level. Electronically Signed   By: Genevie Ann M.D.   On:  10/30/2017 11:08   Dg Chest Port 1 View  Result Date: 10/09/2017 CLINICAL DATA:  Followup pleural effusion. EXAM: PORTABLE CHEST 1 VIEW COMPARISON:  10/18/2017 and multiple previous. FINDINGS: Patient would not agree to raise her chin. Left arm PICC tip is probably unchanged in the SVC at the azygos level. Small amount of pleural fluid persists on the left and possibly on the right. Atelectasis in both lower lungs persists, left worse than right. Volume loss may be slightly worsened in the left lower lobe. Lap for technical factors, no other change appreciated since the study of 3 days ago. IMPRESSION: Probable small effusions. Lower lobe atelectasis left worse than right, probably with some worsening on the left since the study of 3 days ago. Electronically Signed   By: Nelson Chimes M.D.   On: 10/11/2017 07:00    Assessment/Plan: S/P Procedure(s) (LRB): LEFT VIDEO ASSISTED THORACOSCOPY (VATS) for MEDISTINAL DRAINAGE (Left)  1 doing well overall  2 cultures pending- cont current abx, leukocytosis is pretty stable- H/H is inmproved after blood transfusion 3 minor drainage, no air leak- plan to keep tubes in several days to maximize drainage 4 d/c aline as may be contributing to hand pain 5 renal fxn normal- excellent UO 6 CXR pending 7 pulm toilet and mobilize as able 8 clear liquids, advance diet slowly 9 medical management per primary svc 10 ok to resume heparin  LOS: 8 days    [name] 10/22/2017

## 2017-10-22 NOTE — Progress Notes (Signed)
BUE venous duplex prelim: Limited due to lines/ bandages in arms. No DVT Rt arm. Positive DVT of Lt radial vein. Gave results to RN. Landry Mellow, RDMS, RVT

## 2017-10-22 NOTE — Progress Notes (Signed)
[facility]   Reason for visit: Follow up on mediastinal fluid  Interval History: VATS for mediastinal drainage yesterday; has been afebrile today; states she feels pretty good now after a good cough; no chills, no complaints of pain.   Day 9 total antibiotics Day 9 vancomycin, pip-tazo Day 8 anidulafungin  Physical Exam: Constitutional:  Vitals:   10/22/17 0800 10/22/17 0802  BP:    Pulse:    Resp: (!) 22   Temp:  (!) 97.5 F (36.4 C)  SpO2: 100% 100%   patient appears in NAD, up in chair Eyes: anicteric Respiratory: Normal respiratory effort; CTA B, anterior exam Cardiovascular: RRR GI: soft, nt, nd Lines: left [place] line c/d/i  Review of Systems: Constitutional: negative for fevers and chills Respiratory: negative for cough or pleurisy/chest pain Gastrointestinal: negative for nausea and diarrhea  Lab Results  Component Value Date   WBC 26.8 (H) 10/22/2017   HGB 11.1 (L) 10/22/2017   HCT 34.0 (L) 10/22/2017   MCV 83.5 10/22/2017   PLT 295 10/22/2017    Lab Results  Component Value Date   CREATININE 0.77 10/22/2017   BUN 13 10/22/2017   NA 135 10/22/2017   K 3.6 10/22/2017   CL 111 10/22/2017   CO2 18 (L) 10/22/2017    Lab Results  Component Value Date   ALT 37 10/31/2017   AST 60 (H) 10/23/2017   ALKPHOS 112 10/29/2017     Microbiology: Recent Results (from the past 240 hour(s))  Culture, blood (Routine x 2)     Status: None   Collection Time: 11/02/2017  2:05 PM  Result Value Ref Range Status   Specimen Description BLOOD LEFT FOREARM  Final   Special Requests   Final    BOTTLES DRAWN AEROBIC AND ANAEROBIC Blood Culture results may not be optimal due to an inadequate volume of blood received in culture bottles   Culture   Final    NO GROWTH 5 DAYS Performed at [facility], [place] [address] [place], [name] [address]    Report Status 10/19/2017 FINAL  Final  MRSA PCR Screening     Status: None   Collection Time:  10/15/2017 10:45 PM  Result Value Ref Range Status   MRSA by PCR NEGATIVE NEGATIVE Final    Comment:        The GeneXpert MRSA Assay (FDA approved for NASAL specimens only), is one component of a comprehensive MRSA colonization surveillance program. It is not intended to diagnose MRSA infection nor to guide or monitor treatment for MRSA infections.   Culture, blood (Routine x 2)     Status: None   Collection Time: 10/15/2017 11:28 PM  Result Value Ref Range Status   Specimen Description BLOOD LEFT HAND  Final   Special Requests IN PEDIATRIC BOTTLE Blood Culture adequate volume  Final   Culture   Final    NO GROWTH 5 DAYS Performed at [facility], [place] [address] [place], [place] [address]    Report Status 10/20/2017 FINAL  Final  C difficile quick scan w PCR reflex     Status: None   Collection Time: 10/16/17  2:47 PM  Result Value Ref Range Status   C Diff antigen NEGATIVE NEGATIVE Final   C Diff toxin NEGATIVE NEGATIVE Final   C Diff interpretation No C. difficile detected.  Final  Gastrointestinal Panel by PCR , Stool     Status: None   Collection Time: 10/16/17  2:48 PM  Result Value Ref Range Status   Campylobacter species NOT DETECTED NOT DETECTED Final   Plesimonas shigelloides NOT DETECTED NOT DETECTED Final   Salmonella species NOT DETECTED NOT DETECTED Final   Yersinia enterocolitica NOT DETECTED NOT DETECTED Final   Vibrio species NOT DETECTED NOT DETECTED Final   Vibrio cholerae NOT DETECTED NOT DETECTED Final   Enteroaggregative E coli (EAEC) NOT DETECTED NOT DETECTED Final   Enteropathogenic E coli (EPEC) NOT DETECTED NOT DETECTED Final   Enterotoxigenic E coli (ETEC) NOT DETECTED NOT DETECTED Final   Shiga like toxin producing E coli (STEC) NOT DETECTED NOT DETECTED Final   Shigella/Enteroinvasive E coli (EIEC) NOT DETECTED NOT DETECTED Final   Cryptosporidium NOT DETECTED NOT DETECTED Final   Cyclospora cayetanensis NOT DETECTED NOT DETECTED Final    Entamoeba histolytica NOT DETECTED NOT DETECTED Final   Giardia lamblia NOT DETECTED NOT DETECTED Final   Adenovirus F40/41 NOT DETECTED NOT DETECTED Final   Astrovirus NOT DETECTED NOT DETECTED Final   Norovirus GI/GII NOT DETECTED NOT DETECTED Final   Rotavirus A NOT DETECTED NOT DETECTED Final   Sapovirus (I, II, IV, and V) NOT DETECTED NOT DETECTED Final  Culture, fungus without smear     Status: None (Preliminary result)   Collection Time: 10/18/17  3:19 PM  Result Value Ref Range Status   Specimen Description Pleural, L  Final   Special Requests NONE  Final   Culture   Final    NO FUNGUS ISOLATED AFTER 3 DAYS Performed at [facility], [address], [place], [place] [address]    Report Status PENDING  Incomplete  Culture, body fluid-bottle     Status: None (Preliminary result)   Collection Time: 10/18/17  3:19 PM  Result Value Ref Range Status   Specimen Description FLUID LEFT PLEURAL  Final   Special Requests BOTTLES DRAWN AEROBIC AND ANAEROBIC 10CC  Final   Culture   Final    NO GROWTH 3 DAYS Performed at [facility], [place] [address] [place], [place] [address]    Report Status PENDING  Incomplete  Gram stain     Status: None   Collection Time: 10/18/17  3:19 PM  Result Value Ref Range Status   Specimen Description FLUID LEFT PLEURAL  Final   Special Requests NONE  Final   Gram Stain   Final    ABUNDANT WBC PRESENT,BOTH PMN AND MONONUCLEAR NO ORGANISMS SEEN Performed at [facility], [address], [place], [place] [address]    Report Status 10/18/2017 FINAL  Final  Culture, blood (routine x 2)     Status: None (Preliminary result)   Collection Time: 10/19/17  9:45 AM  Result Value Ref Range Status   Specimen Description BLOOD BLOOD RIGHT FOREARM  Final   Special Requests IN PEDIATRIC BOTTLE Blood Culture adequate volume  Final   Culture   Final    NO GROWTH 2 DAYS Performed at [facility], [place] [address] [place], [place] [address]      Report Status PENDING  Incomplete  Culture, blood (routine x 2)     Status: None (Preliminary result)   Collection Time: 10/19/17  9:48 AM  Result Value Ref Range Status   Specimen Description BLOOD RIGHT HAND  Final   Special Requests IN PEDIATRIC BOTTLE Blood Culture adequate volume  Final   Culture   Final    NO GROWTH 2 DAYS Performed at [facility], [address] [place], [place] [address]    Report Status PENDING  Incomplete  Culture, Urine     Status: None   Collection Time: 10/20/17  5:06 PM  Result Value Ref Range Status   Specimen Description URINE, CATHETERIZED  Final   Special Requests NONE  Final   Culture   Final    NO GROWTH Performed at [facility], [address] [place], [place] [address]    Report Status 10/22/2017 FINAL  Final  Surgical PCR screen     Status: None   Collection Time: 10/20/2017  3:21 AM  Result Value Ref Range Status   MRSA, PCR NEGATIVE NEGATIVE Final   Staphylococcus aureus NEGATIVE NEGATIVE Final    Comment: (NOTE) The Xpert SA Assay (FDA approved for NASAL specimens in patients 63 years of age and older), is one component of a comprehensive surveillance program. It is not intended to diagnose infection nor to guide or monitor treatment.   Body fluid culture     Status: None (Preliminary result)   Collection Time: 10/20/2017  9:02 AM  Result Value Ref Range Status   Specimen Description FLUID  Final   Special Requests MEDISTINAL PATIENT ON FOLLOWING ZOSYN AND VANC  Final   Gram Stain   Final    MODERATE WBC PRESENT,BOTH PMN AND MONONUCLEAR NO ORGANISMS SEEN    Culture PENDING  Incomplete   Report Status PENDING  Incomplete  Body fluid culture     Status: None (Preliminary result)   Collection Time: 10/16/2017  9:02 AM  Result Value Ref Range Status   Specimen Description FLUID PLEURAL LEFT  Final   Special Requests PATIENT ON FOLLOWING ZOSYN AND VANC  Final   Gram Stain   Final    MODERATE WBC PRESENT, PREDOMINANTLY  MONONUCLEAR NO ORGANISMS SEEN    Culture PENDING  Incomplete   Report Status PENDING  Incomplete    Impression/Plan:  1. Mediastinitis/abscess - some WBCs noted on gram stain, suspect abscess.  Drains in.  On broad coverage.  For now, I will keep her on the 3 antibiotics and monitor cultures.   2.  Fever - likely from # 1 and no fever today.  Will continue to monitor  3.  Leukocytosis - stable.  Will monitor now post drainage.    Dr. Tommy Medal will monitor the cultures over the weekend, Dr. Baxter Flattery will follow up on Monday.

## 2017-10-22 NOTE — Discharge Instructions (Signed)
Thoracoscopy, Care After °Refer to this sheet in the next few weeks. These instructions provide you with information about caring for yourself after your procedure. Your health care provider may also give you more specific instructions. Your treatment has been planned according to current medical practices, but problems sometimes occur. Call your health care provider if you have any problems or questions after your procedure. °What can I expect after the procedure? °After your procedure, it is common to feel sore for up to two weeks. °Follow these instructions at home: °· There are many different ways to close and cover an incision, including stitches (sutures), skin glue, and adhesive strips. Follow your health care provider's instructions about: °? Incision care. °? Bandage (dressing) changes and removal. °? Incision closure removal. °· Check your incision area every day for signs of infection. Watch for: °? Redness, swelling, or pain. °? Fluid, blood, or pus. °· Take medicines only as directed by your health care provider. °· Try to cough often. Coughing helps to protect against lung infection (pneumonia). It may hurt to cough. If this happens, hold a pillow against your chest when you cough. °· Take deep breaths. This also helps to protect against pneumonia. °· If you were given an incentive spirometer, use it as directed by your health care provider. °· Do not take baths, swim, or use a hot tub until your health care provider approves. You may take showers. °· Avoid lifting until your health care provider approves. °· Avoid driving until your health care provider approves. °· Do not travel by airplane after the chest tube is removed until your health care provider approves. °Contact a health care provider if: °· You have a fever. °· Pain medicines do not ease your pain. °· You have redness, swelling, or increasing pain in your incision area. °· You develop a cough that does not go away, or you are coughing up  mucus that is yellow or green. °Get help right away if: °· You have fluid, blood, or pus coming from your incision. °· There is a bad smell coming from your incision or dressing. °· You develop a rash. °· You have difficulty breathing. °· You cough up blood. °· You develop light-headedness or you feel faint. °· You develop chest pain. °· Your heartbeat feels irregular or very fast. °This information is not intended to replace advice given to you by your health care provider. Make sure you discuss any questions you have with your health care provider. °Document Released: 06/12/2005 Document Revised: 07/26/2016 Document Reviewed: 08/08/2014 °Elsevier Interactive Patient Education © 2018 [employer] Inc[employer] ° °

## 2017-10-22 NOTE — Progress Notes (Signed)
ANTICOAGULATION CONSULT NOTE - Follow Up Consult  Pharmacy Consult for Heparin Indication: DVT  Allergies  Allergen Reactions  . Zithromax [Azithromycin] Swelling and Other (See Comments)    Mouth swelling and blisters   . Aspirin Other (See Comments)    Stomach cramping   . Ibuprofen Other (See Comments)    Pt states that she prefers not to take this medication.   . Iron Nausea And Vomiting and Other (See Comments)    FERROUS SULFATE  . Sulfa Antibiotics Nausea And Vomiting    Patient Measurements: Height: 5' 4.5" (163.8 cm) Weight: 123 lb 0.3 oz (55.8 kg) IBW/kg (Calculated) : 55.85 Heparin Dosing Weight: 55.8 kg  Vital Signs: Temp: 98 F (36.7 C) (11/16 1932) Temp Source: Oral (11/16 1932) BP: 91/57 (11/16 1932) Pulse Rate: 99 (11/16 1932)  Labs: Recent Labs    10/20/17 0418 10/20/17 1306 10/20/17 2206 10/18/2017 0321 10/22/17 0400 10/22/17 2214  HGB 7.6*  --   --  8.6* 11.1*  --   HCT 23.3*  --   --  26.0* 34.0*  --   PLT 362  --   --  328 295  --   HEPARINUNFRC  --  0.12* 0.21*  --   --  0.51  CREATININE 0.85  --   --  0.87 0.77  --     Estimated Creatinine Clearance: 58.5 mL/min (by C-G formula based on SCr of 0.77 mg/dL).   Medications:  Scheduled:  . acetaminophen  1,000 mg Oral Q6H   Or  . acetaminophen (TYLENOL) oral liquid 160 mg/5 mL  1,000 mg Oral Q6H  . bisacodyl  10 mg Oral Daily  . fentaNYL   Intravenous Q4H  . lip balm  1 application Topical BID  . senna-docusate  1 tablet Oral QHS   Infusions:  . anidulafungin Stopped (10/22/17 1949)  . heparin 1,600 Units/hr (10/22/17 1533)  . piperacillin-tazobactam (ZOSYN)  IV 3.375 g (10/22/17 2205)  . potassium chloride Stopped (10/22/17 1145)  . TPN ADULT (ION) 70 mL/hr at 10/22/17 1717    Assessment: 89 yof on apixaban PTA (last dose 11/7) for DVT hx admitted with bilateral UE DVT. Pharmacy consulted to resume heparin s/p VATS 11/15 with TCTS approval. CBC stable. No bleed  documented.  Heparin level is therapeutic on 1600 units/hr.  Goal of Therapy:  Heparin level 0.3-0.7 units/ml Monitor platelets by anticoagulation protocol: Yes   Plan:  Continue heparin at 1600 units/hr Heparin level with AM labs for confirmation Heparin level and CBC daily while on heparin  [employer], Pharm.D., BCPS Clinical Pharmacist 10/22/2017 10:49 PM

## 2017-10-22 NOTE — Progress Notes (Signed)
4 bed rails up per patient request. Will continue to monitor.

## 2017-10-22 NOTE — Progress Notes (Signed)
ANTICOAGULATION CONSULT NOTE  Pharmacy Consult for heparin Indication: DVT  Heparin Dosing Weight: 55.8 kg   Assessment: 33 yof on apixaban PTA (last dose 11/7) for DVT hx admitted with bilateral UE DVT. Pharmacy consulted to resume heparin s/p VATS 11/15 with TCTS approval. CBC stable. No bleed documented.   Goal of Therapy:  Heparin level 0.3-0.7 units/ml Monitor platelets by anticoagulation protocol: Yes   Plan:  Resume heparin at previous rate 1600 units/hr (no bolus) 6h heparin level Daily heparin level/CBC Monitor for s/sx bleeding F/u resume apixaban as appropriate   Elicia Lamp, PharmD, BCPS Clinical Pharmacist 10/22/2017 2:43 PM

## 2017-10-22 NOTE — Progress Notes (Addendum)
Subjective No acute events. Feeling reasonably well. Afebrile. Pain is well controlled, aside from some discomfort in her left hand/wrist.  Objective: Vital signs in last 24 hours: Temp:  [97.4 F (36.3 C)-97.5 F (36.4 C)] 97.4 F (36.3 C) (11/16 1100) Pulse Rate:  [80-97] 80 (11/16 1100) Resp:  [14-29] 27 (11/16 1205) BP: (96-127)/(57-75) 96/57 (11/16 1100) SpO2:  [99 %-100 %] 99 % (11/16 1205) Arterial Line BP: (93-124)/(49-57) 101/49 (11/16 0400) Last BM Date: (pt has flexi-seal)  Intake/Output from previous day: 11/15 0701 - 11/16 0700 In: 3065 [P.O.:150; I.V.:2055; Blood:630; IV Piggyback:230] Out: 3662 [Urine:2650; Stool:200; Blood:600; Chest Tube:390] Intake/Output this shift: Total I/O In: 1060 [P.O.:230; I.V.:630; IV Piggyback:200] Out: 280 [Urine:200; Chest Tube:80]  Gen: NAD, comfortable CV: RRR Pulm: Normal work of breathing; coarse BS bilaterally; CT/mediastinal tube in place, no air leak Abd: Soft, NT/ND Ext: SCDs in place  UOP: adequate Mediastinal drain: 260cc/ss CT: 130cc/ss  Lab Results: CBC  Recent Labs    10/10/2017 0321 10/22/17 0400  WBC 25.6* 26.8*  HGB 8.6* 11.1*  HCT 26.0* 34.0*  PLT 328 295   BMET Recent Labs    11/03/2017 0321 10/22/17 0400  NA 134* 135  K 3.4* 3.6  CL 109 111  CO2 19* 18*  GLUCOSE 119* 205*  BUN 11 13  CREATININE 0.87 0.77  CALCIUM 7.5* 8.0*   ABG Recent Labs    10/22/17 0420  PHART 7.317*  HCO3 17.9*     Assessment/Plan: Patient Active Problem List   Diagnosis Date Noted  . Dehydration   . Acute embolism and thrombosis of left axillary vein ([place])   . Septic shock ([place]) 10/17/2017  . Patient's noncompliance with intervention / medical advice  10/17/2017  . 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 Pleural effusion, left 10/16/2017  . 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 DVT (deep venous thrombosis) ([place])   . Metabolic acidosis   . Thrombocytosis ([place])   . Protein-calorie malnutrition,  moderate ([place])   . Normochromic normocytic anemia   . SIRS (systemic inflammatory response syndrome) ([place]) 10/01/2017  . Pressure injury of skin 09/25/2017  . Hypoglycemia 09/25/2017  . Electrolyte imbalance 09/15/2017  . Anasarca 09/13/2017  . S/P thoracentesis 09/11/2017  . Peritonitis ([place]) 09/11/2017  . Fungemia 09/11/2017  . Gastrostomy tube in place ([place]) 09/08/2017  . Protein-calorie malnutrition, severe 06/10/2017  . Nausea and vomiting 06/08/2017  . 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 Hypotension 05/07/2017  . UTI (urinary tract infection) 05/07/2017  . Crohn disease ([place]) 02/19/2017  . Immunosuppressed status ([place]) 12/09/2015  . Incarcerated hiatal hernia s/p reduction 09/08/2017 12/07/2015  . Acute renal failure superimposed on stage 3 chronic kidney disease ([place]) 12/06/2015  . Perianal Crohn's disease, with fistula ([place])   . Hypomagnesemia   . Leukocytosis 11/06/2011   s/p Procedure(s): LEFT VIDEO ASSISTED THORACOSCOPY (VATS) for MEDISTINAL DRAINAGE 10/27/2017  -Doing reasonably well -Expected leukocytosis & CXR findings, no fevers, on IV abx, ID following & cultures are pending -Continue TPN -Mobilize as able -DVT US UE pending -PPx: Chemical + mechanical dvt ppx, f/u venous duplex   LOS: 8 days   Sharon Mt. Dema Severin, M.D. [facility]

## 2017-10-22 NOTE — Progress Notes (Signed)
Genevive Printup Stelle  MGN:[id number] DOB: [birth year] DOA: 11/04/2017 PCP: Iona Beard, MD    Brief Narrative:  71 y.o.F w/ a Hx of Crohn's disease, hiatal hernia, psoriasis, and SBO who underwent a type IV hiatal hernia repair in October 2018. Postoperatively the patient required endotracheal intubation, mechanical ventilation and the use of pressors. She was extubated on postop day #2. She improved clinically and was on parenteral nutrition, however she developed a pelvic fluid collection which required a drain. On 09/11/2017 she had blood cultures positive for Candida glabrata, and the pelvic abscess culture was positive for C. Albicans and C. Glabrata. She was given treatment withparenteral antifungals. She subsequently improved, was able to ambulate with physical therapy, weaned off of parenteral nutrition and was tolerating oral diet. ID recommended to continue Invanz andErasisfor 2 weeks. A PICC line was placed in her right arm and she was ultimately discharged home.  She returned on 10/01/2017 due to intermittent fevers. She was treated empirically and clinically improved to the point that she was able to be d/c home again on 11/1.  She returned to the ED 11/8 with complaints of persistent nausea associated with emesis.   Significant Events: 11/8 readmit 11/11 PICC placed 11/12 thoracentesis 11/5 to OR at [facility] for L VATS for mediastinal drainage   Subjective: Patient reports that her chest feels "less tight". Does report some soreness from surgery. Denies dyspnea.  Assessment & Plan:  Severe Sepsis/Mediastinal abscess -CT with reaccumulation of fluid collection in the lower mediastinum. Esophagram showing 2.5 cm area of extrinsic compression upon the esophagus. Possible smaller abscesses posterior margin spleen See results below -11/12 S/P LEFT Thoracentesis: 780 mL fluid removed: Exudative, Gram stain with WBCs and PMNs -Per Dr. Zenovia Jarred GI note from 11/9 he reviewed CT  scan abdomen pelvis discussed with Dr. Ardis Hughs both agree mediastinal abscess complex not amenable to endoscopic drainage. Recommended cardiothoracic surgery be consulted. -IR consulted: Thoracentesis completed. Per review of EMR not candidate for percutaneous drain placement. If drain felt necessary consider tertiary care center. -transferred to [facility] to OR for VATS drainage on 11/15 - immediate post-op care per TCTS, but pt to remain on [place]  - S/P VATS drainage on 11/15. Discussed with and management per TCTS team. ID follow-up appreciated and recommend continuing 3 antibiotics and monitor cultures. Fevers better. Still has significant leukocytosis but not marked increase since yesterday. Follow CBCs.  Nutrition  -TPA started per Surgery  Pyuria -11/14 Urine culture showed no growth.  Anemia -likely primarily due to severe acute/subacute illness - Fe is quite low, but will avoid loading w/ IV Fe until clear no bacteremia  - EBL 600 mL during VATS procedure but procedure note indicates no blood transfusion. Hemoglobin however has improved from 8.6-11.? Transfused. - Follow CBCs.  Chronic diastolic CHF -follow Is/Os and daily weights . Compensated.  Essential HTN - Soft blood pressure. Monitor closely.  Bilateral upper extremity DVTs - Noted acute right axillary vein thrombosis by venous Doppler 10/02/17. She had PICC line on that side it appears at that time. - Heparin infusion had been held for the procedure. - ID had requested bilateral upper extremity venous Dopplers to evaluate for FUO (I discussed with Dr. Tommy Medal today) and preliminary results show no DVT in right arm but positive DVT of left radial vein. She has left subclavian PICC line. - I discussed with Dr. Tommy Medal and TCTS and consensus was to resume heparin anticoagulation and hold off on pulling PICC line  at this time. As per d/w TCTS, it has been greater than 24 hours since surgery and they cleared for resuming IV  heparin infusion. 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 She was on Eliquis PTA.  History of Crohns/ chronic diarrhea   Hypokalemia Replace as needed to goal of 4.0  DVT prophylaxis: Resumed full dose IV heparin infusion for bilateral upper extremity DVTs. Code Status: FULL CODE Family Communication: None at bedside. Disposition Plan: SDU  Consultants:  TCTS ID Gen Surgery IR  Antimicrobials:  Zosyn 11/8 > Anidulafungin 11/9 > Vanc 11/8 > 11/11 + 11/15 >  Objective:  Vitals:   10/22/17 0800 10/22/17 0802 10/22/17 1100 10/22/17 1205  BP:   (!) 96/57   Pulse:   80   Resp: (!) 22  17 (!) 27  Temp:  (!) 97.5 F (36.4 C) (!) 97.4 F (36.3 C)   TempSrc:  Oral Oral   SpO2: 100% 100% 100% 99%  Weight:      Height:         Intake/Output Summary (Last 24 hours) at 10/22/2017 1534 Last data filed at 10/22/2017 1300 Gross per 24 hour  Intake 2749 ml  Output 2770 ml  Net -21 ml   Filed Weights   10/15/17 1449 10/20/17 1600 10/19/2017 0500  Weight: 55.9 kg (123 lb 3.8 oz) 55.3 kg (121 lb 14.6 oz) 55.8 kg (123 lb 0.3 oz)    Examination: Gen. exam: Middle-aged female, small built and frail, sitting up comfortably in chair this morning. Does not appear in any distress. Respiratory system: Reduced breath sounds in the bases but otherwise clear to auscultation. No increased work of breathing. Cardiovascular system: S1 and S2 heard, RRR. No JVD, murmurs or pedal edema. Telemetry: Sinus rhythm. Abdomen: Nondistended, soft and nontender. Normal bowel sounds heard. CNS: Alert and oriented. No focal neurological deficits. Extremities: Symmetric 5 x 5 power.  CBC: Recent Labs  Lab 10/17/17 1126 10/18/17 0542 10/19/17 0500 10/19/17 1458 10/20/17 0418 10/08/2017 0321 10/22/17 0400  WBC 28.7* 24.1* 24.3*  --  24.2* 25.6* 26.8*  NEUTROABS 18.6* 15.7*  --   --   --   --   --   HGB 8.6* 8.0* 7.6* 7.9* 7.6* 8.6* 11.1*  HCT 26.2* 24.4* 22.9* 24.4* 23.3* 26.0* 34.0*  MCV 86.2 89.4  86.4  --  86.3 85.5 83.5  PLT 444* 384 360  --  362 328 601   Basic Metabolic Panel: Recent Labs  Lab 10/18/17 0542 10/19/17 0500 10/20/17 0418 11/02/2017 0321 10/22/17 0400  NA 132* 136 135 134* 135  K 4.0 2.9* 3.4* 3.4* 3.6  CL 110 112* 112* 109 111  CO2 16* 19* 20* 19* 18*  GLUCOSE 444* 119* 109* 119* 205*  BUN 7 8 10 11 13   CREATININE 0.97 0.94 0.85 0.87 0.77  CALCIUM 7.4* 7.2* 7.6* 7.5* 8.0*  MG 2.0 1.4* 2.4 1.8 1.6*  PHOS 3.5 2.3* 3.1 2.3* 3.0   GFR: Estimated Creatinine Clearance: 58.5 mL/min (by C-G formula based on SCr of 0.77 mg/dL).  Liver Function Tests: Recent Labs  Lab 10/17/17 1126 10/18/17 0542 10/19/17 0500 10/20/17 0418 10/29/2017 0321  AST 23 20 45* 117* 60*  ALT 14 12* 20 50 37  ALKPHOS 71 60 77 109 112  BILITOT 1.0 0.3 0.8 0.5 0.8  PROT 6.3* 5.7* 5.7* 6.1* 6.0*  ALBUMIN 1.8* 1.6* 1.6* 1.6* 1.5*    Coagulation Profile: No results for input(s): INR, PROTIME in the last 168 hours.  HbA1C: Hgb A1c MFr  Bld  Date/Time Value Ref Range Status  09/09/2017 06:00 PM 5.0 4.8 - 5.6 % Final    Comment:    (NOTE) Pre diabetes:          5.7%-6.4% Diabetes:              >6.4% Glycemic control for   <7.0% adults with diabetes   04/25/2017 09:32 AM 5.4 4.8 - 5.6 % Final    Comment:    (NOTE)         Pre-diabetes: 5.7 - 6.4         Diabetes: >6.4         Glycemic control for adults with diabetes: <7.0     CBG: Recent Labs  Lab 10/20/17 0754 10/20/17 1206 10/20/17 1638 10/20/17 2211 10/31/2017 1649  GLUCAP 97 88 102* 93 163*    Recent Results (from the past 240 hour(s))  Culture, blood (Routine x 2)     Status: None   Collection Time: 10/28/2017  2:05 PM  Result Value Ref Range Status   Specimen Description BLOOD LEFT FOREARM  Final   Special Requests   Final    BOTTLES DRAWN AEROBIC AND ANAEROBIC Blood Culture results may not be optimal due to an inadequate volume of blood received in culture bottles   Culture   Final    NO GROWTH 5  DAYS Performed at [facility], [place] [address], [place], [place] [address]    Report Status 10/19/2017 FINAL  Final  MRSA PCR Screening     Status: None   Collection Time: 10/20/2017 10:45 PM  Result Value Ref Range Status   MRSA by PCR NEGATIVE NEGATIVE Final    Comment:        The GeneXpert MRSA Assay (FDA approved for NASAL specimens only), is one component of a comprehensive MRSA colonization surveillance program. It is not intended to diagnose MRSA infection nor to guide or monitor treatment for MRSA infections.   Culture, blood (Routine x 2)     Status: None   Collection Time: 11/01/2017 11:28 PM  Result Value Ref Range Status   Specimen Description BLOOD LEFT HAND  Final   Special Requests IN PEDIATRIC BOTTLE Blood Culture adequate volume  Final   Culture   Final    NO GROWTH 5 DAYS Performed at [facility], [place] [address] [place], [place] [address]    Report Status 10/20/2017 FINAL  Final  C difficile quick scan w PCR reflex     Status: None   Collection Time: 10/16/17  2:47 PM  Result Value Ref Range Status   C Diff antigen NEGATIVE NEGATIVE Final   C Diff toxin NEGATIVE NEGATIVE Final   C Diff interpretation No C. difficile detected.  Final  Gastrointestinal Panel by PCR , Stool     Status: None   Collection Time: 10/16/17  2:48 PM  Result Value Ref Range Status   Campylobacter species NOT DETECTED NOT DETECTED Final   Plesimonas shigelloides NOT DETECTED NOT DETECTED Final   Salmonella species NOT DETECTED NOT DETECTED Final   Yersinia enterocolitica NOT DETECTED NOT DETECTED Final   Vibrio species NOT DETECTED NOT DETECTED Final   Vibrio cholerae NOT DETECTED NOT DETECTED Final   Enteroaggregative E coli (EAEC) NOT DETECTED NOT DETECTED Final   Enteropathogenic E coli (EPEC) NOT DETECTED NOT DETECTED Final   Enterotoxigenic E coli (ETEC) NOT DETECTED NOT DETECTED Final   Shiga like toxin producing E coli (STEC) NOT DETECTED NOT  DETECTED Final    Shigella/Enteroinvasive E coli (EIEC) NOT DETECTED NOT DETECTED Final   Cryptosporidium NOT DETECTED NOT DETECTED Final   Cyclospora cayetanensis NOT DETECTED NOT DETECTED Final   Entamoeba histolytica NOT DETECTED NOT DETECTED Final   Giardia lamblia NOT DETECTED NOT DETECTED Final   Adenovirus F40/41 NOT DETECTED NOT DETECTED Final   Astrovirus NOT DETECTED NOT DETECTED Final   Norovirus GI/GII NOT DETECTED NOT DETECTED Final   Rotavirus A NOT DETECTED NOT DETECTED Final   Sapovirus (I, II, IV, and V) NOT DETECTED NOT DETECTED Final  Culture, fungus without smear     Status: None (Preliminary result)   Collection Time: 10/18/17  3:19 PM  Result Value Ref Range Status   Specimen Description Pleural, L  Final   Special Requests NONE  Final   Culture   Final    NO FUNGUS ISOLATED AFTER 3 DAYS Performed at [facility], [address], [place], [place] [address]    Report Status PENDING  Incomplete  Culture, body fluid-bottle     Status: None (Preliminary result)   Collection Time: 10/18/17  3:19 PM  Result Value Ref Range Status   Specimen Description FLUID LEFT PLEURAL  Final   Special Requests BOTTLES DRAWN AEROBIC AND ANAEROBIC 10CC  Final   Culture   Final    NO GROWTH 4 DAYS Performed at [facility], [place] [address], [place], [place] [address]    Report Status PENDING  Incomplete  Gram stain     Status: None   Collection Time: 10/18/17  3:19 PM  Result Value Ref Range Status   Specimen Description FLUID LEFT PLEURAL  Final   Special Requests NONE  Final   Gram Stain   Final    ABUNDANT WBC PRESENT,BOTH PMN AND MONONUCLEAR NO ORGANISMS SEEN Performed at [facility], [address], [place], [place] [address]    Report Status 10/18/2017 FINAL  Final  Culture, blood (routine x 2)     Status: None (Preliminary result)   Collection Time: 10/19/17  9:45 AM  Result Value Ref Range Status   Specimen Description BLOOD BLOOD RIGHT FOREARM  Final   Special  Requests IN PEDIATRIC BOTTLE Blood Culture adequate volume  Final   Culture   Final    NO GROWTH 3 DAYS Performed at [facility], [place] [address], [place], [place] [address]    Report Status PENDING  Incomplete  Culture, blood (routine x 2)     Status: None (Preliminary result)   Collection Time: 10/19/17  9:48 AM  Result Value Ref Range Status   Specimen Description BLOOD RIGHT HAND  Final   Special Requests IN PEDIATRIC BOTTLE Blood Culture adequate volume  Final   Culture   Final    NO GROWTH 3 DAYS Performed at [facility], [place] [address], [place], [place] [address]    Report Status PENDING  Incomplete  Culture, Urine     Status: None   Collection Time: 10/20/17  5:06 PM  Result Value Ref Range Status   Specimen Description URINE, CATHETERIZED  Final   Special Requests NONE  Final   Culture   Final    NO GROWTH Performed at [facility], [address], [place],  [address]    Report Status 10/22/2017 FINAL  Final  Surgical PCR screen     Status: None   Collection Time: 10/15/2017  3:21 AM  Result Value Ref Range  Status   MRSA, PCR NEGATIVE NEGATIVE Final   Staphylococcus aureus NEGATIVE NEGATIVE Final    Comment: (NOTE) The Xpert SA Assay (FDA approved for NASAL specimens in patients 25 years of age and older), is one component of a comprehensive surveillance program. It is not intended to diagnose infection nor to guide or monitor treatment.   Body fluid culture     Status: None (Preliminary result)   Collection Time: 10/28/2017  9:02 AM  Result Value Ref Range Status   Specimen Description FLUID  Final   Special Requests MEDISTINAL PATIENT ON FOLLOWING ZOSYN AND VANC  Final   Gram Stain   Final    MODERATE WBC PRESENT,BOTH PMN AND MONONUCLEAR NO ORGANISMS SEEN    Culture NO GROWTH < 24 HOURS  Final   Report Status PENDING  Incomplete  Body fluid culture     Status: None (Preliminary result)   Collection Time: 11/03/2017  9:02 AM  Result  Value Ref Range Status   Specimen Description FLUID PLEURAL LEFT  Final   Special Requests PATIENT ON FOLLOWING ZOSYN AND VANC  Final   Gram Stain   Final    MODERATE WBC PRESENT, PREDOMINANTLY MONONUCLEAR NO ORGANISMS SEEN    Culture NO GROWTH < 24 HOURS  Final   Report Status PENDING  Incomplete     Scheduled Meds: . acetaminophen  1,000 mg Oral Q6H   Or  . acetaminophen (TYLENOL) oral liquid 160 mg/5 mL  1,000 mg Oral Q6H  . bisacodyl  10 mg Oral Daily  . fentaNYL   Intravenous Q4H  . lip balm  1 application Topical BID  . senna-docusate  1 tablet Oral QHS     LOS: 8 days   Vernell Leep, MD, FACP, [facility]. [facility] Pager [phone]  If 7PM-7AM, please contact night-coverage [email or web address] Password [facility] 10/22/2017, 3:49 PM

## 2017-10-23 LAB — CBC
HEMATOCRIT: 35.7 % — AB (ref 36.0–46.0)
Hemoglobin: 11.3 g/dL — ABNORMAL LOW (ref 12.0–15.0)
MCH: 26.7 pg (ref 26.0–34.0)
MCHC: 31.7 g/dL (ref 30.0–36.0)
MCV: 84.2 fL (ref 78.0–100.0)
PLATELETS: 327 10*3/uL (ref 150–400)
RBC: 4.24 MIL/uL (ref 3.87–5.11)
RDW: 19.6 % — AB (ref 11.5–15.5)
WBC: 27.3 10*3/uL — ABNORMAL HIGH (ref 4.0–10.5)

## 2017-10-23 LAB — COMPREHENSIVE METABOLIC PANEL
ALBUMIN: 1.6 g/dL — AB (ref 3.5–5.0)
ALT: 27 U/L (ref 14–54)
ANION GAP: 8 (ref 5–15)
AST: 28 U/L (ref 15–41)
Alkaline Phosphatase: 111 U/L (ref 38–126)
BILIRUBIN TOTAL: 0.4 mg/dL (ref 0.3–1.2)
BUN: 16 mg/dL (ref 6–20)
CHLORIDE: 109 mmol/L (ref 101–111)
CO2: 18 mmol/L — ABNORMAL LOW (ref 22–32)
Calcium: 7.7 mg/dL — ABNORMAL LOW (ref 8.9–10.3)
Creatinine, Ser: 0.8 mg/dL (ref 0.44–1.00)
GFR calc Af Amer: >60 mL/min (ref >60)
GLUCOSE: 85 mg/dL (ref 65–99)
POTASSIUM: 3.9 mmol/L (ref 3.5–5.1)
Sodium: 135 mmol/L (ref 135–145)
TOTAL PROTEIN: 6.9 g/dL (ref 6.5–8.1)

## 2017-10-23 LAB — MAGNESIUM: MAGNESIUM: 2.1 mg/dL (ref 1.7–2.4)

## 2017-10-23 LAB — CULTURE, BODY FLUID W GRAM STAIN -BOTTLE

## 2017-10-23 LAB — HEPARIN LEVEL (UNFRACTIONATED): HEPARIN UNFRACTIONATED: 0.57 [IU]/mL (ref 0.30–0.70)

## 2017-10-23 LAB — CULTURE, BODY FLUID-BOTTLE: CULTURE: NO GROWTH

## 2017-10-23 LAB — PHOSPHORUS: Phosphorus: 3.2 mg/dL (ref 2.5–4.6)

## 2017-10-23 MED ORDER — TRAVASOL 10 % IV SOLN
INTRAVENOUS | Status: AC
  Administered 2017-10-23: 18:00:00 via INTRAVENOUS
  Filled 2017-10-23: qty 672

## 2017-10-23 MED ORDER — PROMETHAZINE HCL 25 MG/ML IJ SOLN
12.5000 mg | Freq: Four times a day (QID) | INTRAMUSCULAR | Status: DC | PRN
  Administered 2017-10-23 – 2017-10-26 (×8): 12.5 mg via INTRAVENOUS
  Filled 2017-10-23 (×8): qty 1

## 2017-10-23 MED ORDER — FENTANYL CITRATE (PF) 100 MCG/2ML IJ SOLN
12.5000 ug | INTRAMUSCULAR | Status: DC | PRN
  Administered 2017-10-30 – 2017-11-03 (×5): 12.5 ug via INTRAVENOUS
  Filled 2017-10-23 (×7): qty 2

## 2017-10-23 NOTE — Progress Notes (Signed)
ANTICOAGULATION CONSULT NOTE - Follow Up Consult  Pharmacy Consult for Heparin Indication: DVT  Allergies  Allergen Reactions  . Zithromax [Azithromycin] Swelling and Other (See Comments)    Mouth swelling and blisters   . Aspirin Other (See Comments)    Stomach cramping   . Ibuprofen Other (See Comments)    Pt states that she prefers not to take this medication.   . Iron Nausea And Vomiting and Other (See Comments)    FERROUS SULFATE  . Sulfa Antibiotics Nausea And Vomiting    Patient Measurements: Height: 5' 4.5" (163.8 cm) Weight: 123 lb 0.3 oz (55.8 kg) IBW/kg (Calculated) : 55.85 Heparin Dosing Weight: 55.8 kg  Vital Signs: Temp: 101.7 F (38.7 C) (11/17 0825) Temp Source: Oral (11/17 0825) BP: 117/61 (11/17 0825) Pulse Rate: 95 (11/17 0330)  Labs: Recent Labs    10/20/17 2206  10/15/2017 0321 10/22/17 0400 10/22/17 2214 10/23/17 0513  HGB  --    < > 8.6* 11.1*  --  11.3*  HCT  --   --  26.0* 34.0*  --  35.7*  PLT  --   --  328 295  --  327  HEPARINUNFRC 0.21*  --   --   --  0.51 0.57  CREATININE  --   --  0.87 0.77  --  0.80   < > = values in this interval not displayed.    Estimated Creatinine Clearance: 58.5 mL/min (by C-G formula based on SCr of 0.8 mg/dL).   Medications:  Scheduled:  . acetaminophen  1,000 mg Oral Q6H   Or  . acetaminophen (TYLENOL) oral liquid 160 mg/5 mL  1,000 mg Oral Q6H  . bisacodyl  10 mg Oral Daily  . fentaNYL   Intravenous Q4H  . lip balm  1 application Topical BID  . senna-docusate  1 tablet Oral QHS   Infusions:  . anidulafungin Stopped (10/22/17 1949)  . heparin 1,600 Units/hr (10/22/17 1533)  . piperacillin-tazobactam (ZOSYN)  IV 3.375 g (10/23/17 0528)  . potassium chloride Stopped (10/22/17 1145)  . TPN ADULT (ION) 70 mL/hr at 10/22/17 1717    Assessment: Danielle [name] on apixaban PTA (last dose 11/7) for DVT hx admitted with bilateral UE DVT. Pharmacy consulted to resume heparin s/p VATS 11/15 with TCTS approval.    CBC stable. No bleed documented.  Heparin level is therapeutic on 1600 units/hr.  Goal of Therapy:  Heparin level 0.3-0.7 units/ml Monitor platelets by anticoagulation protocol: Yes   Plan:  Continue heparin at 1600 units/hr Heparin level and CBC daily while on heparin  Erin Hearing PharmD., BCPS Clinical Pharmacist Pager [phone] 10/23/2017 10:06 AM

## 2017-10-23 NOTE — Progress Notes (Signed)
[place] NOTE   Pharmacy Consult for TPN Indication: Poor PO intake / unable to place repeat G-tube   Patient Measurements: Height: 5' 4.5" (163.8 cm) Weight: 123 lb 0.3 oz (55.8 kg) IBW/kg (Calculated) : 55.85 TPN AdjBW (KG): 59 Body mass index is 20.79 kg/m.  Assessment:  49 yoF re-admit 11/8 with N/V, loose stools (baseline w/Crohn's). Was hospitalized for a month in October and developed fungemia and treated with Invanz and anidulafungin. Re-admitted a week later with intermittent fevers and difficultly swallowing. CT chest showed recurrent mediastinal fluid abscess.  PMH: Crohns, HH, hx SBO  Significant events:  11/15 Left VATs to drain mediastinum   GI: Mediastinal abscess complex, clear liquid diet   Endo: CBGs AM blood glucose 85 off SSI - recent CBG spikes may have been reactive to surgery. Has not required much insulin during admission.  Insulin requirements in the past 24 hours: 0 unit regular insulin  Lytes: Na 135, K 3.6>3.9, CO2 18, Cl 111>109, Mg 2.1, Phos 3.2 Renal: SCr 0.8, UOP 0.8 mL/kg/hr, +1 L /24 hr, +10.7L since admission Pulm:98% RA; Bilateral chest tube output down to 190 mL / 24 hours  Cards: BP soft, HR controlled  Hepatobil: AST/ALT 60/37, Tbili wnl  ID: WBC 27.3, Afebrile, on IV Zosyn and anidulafungin   Best Practices: SCDs TPN Access: PICC 11/11>> TPN start date: 11/11>>  Nutritional Goals (per RD recommendation on 11/14): Kcal:  1475-1770 (25-30 kcal/kg) Protein:  60-70 grams (1-1.2 grams/kg) Fluid:  >/= 1.6 L/day  Current Nutrition:  Clinimix E 5/15 at a goal rate of 55 ml/hr + 20% fat emulsion at 65ml/hr to provide: 66 g/day protein, 1465 Kcal/day Clear liquid diet   Plan:  Continue TPN at 70 mL/hr TPN provides 67 g of protein, 235 g of dextrose, and 50 g lipids providing 1572 total kcal, which meets ~100% of patient needs.  Dextrose infusion rate is 2.9 mg/kg/min Electrolytes in TPN: Cl:Ac  ratio - Max acetate, Na 60 mEq/L, K 30 mEq/L, Mg 10 mEq/L, Phos 20 mmol/L AddMVI,trace elements in TPN Add SSI if blood glucose starts to trend back up  Monitor TPN labs, recheck labs tomorrow,clinical progress    Albertina Parr, PharmD., BCPS Clinical Pharmacist Pager [phone]

## 2017-10-23 NOTE — Progress Notes (Addendum)
      [address]       [place] [address]             [phone]      2 Days Post-Op Procedure(s) (LRB): LEFT VIDEO ASSISTED THORACOSCOPY (VATS) for MEDISTINAL DRAINAGE (Left) Subjective: States that she is having a lot of pain despite the PCA. She does not like taking the oxycodone pills.   Objective: Vital signs in last 24 hours: Temp:  [97.5 F (36.4 C)-101.7 F (38.7 C)] 101.7 F (38.7 C) (11/17 0825) Pulse Rate:  [91-115] 115 (11/17 0825) Cardiac Rhythm: Sinus tachycardia (11/17 0825) Resp:  [17-37] 30 (11/17 1150) BP: (91-117)/(51-61) 117/61 (11/17 0825) SpO2:  [93 %-100 %] 99 % (11/17 1150)     Intake/Output from previous day: 11/16 0701 - 11/17 0700 In: 2368.9 [P.O.:230; I.V.:1658.9; IV Piggyback:480] Out: 0768 [Urine:1100; Chest Tube:190] Intake/Output this shift: Total I/O In: -  Out: 20 [Chest Tube:20]  General appearance: alert, cooperative and no distress Heart: regular rate and rhythm, S1, S2 normal, no murmur, click, rub or gallop Lungs: clear to auscultation bilaterally Abdomen: soft, non-tender; bowel sounds normal; no masses,  no organomegaly Extremities: extremities normal, atraumatic, no cyanosis or edema Wound: clean and dry  Lab Results: Recent Labs    10/22/17 0400 10/23/17 0513  WBC 26.8* 27.3*  HGB 11.1* 11.3*  HCT 34.0* 35.7*  PLT 295 327   BMET:  Recent Labs    10/22/17 0400 10/23/17 0513  NA 135 135  K 3.6 3.9  CL 111 109  CO2 18* 18*  GLUCOSE 205* 85  BUN 13 16  CREATININE 0.77 0.80  CALCIUM 8.0* 7.7*    PT/INR: No results for input(s): LABPROT, INR in the last 72 hours. ABG    Component Value Date/Time   PHART 7.317 (L) 10/22/2017 0420   HCO3 17.9 (L) 10/22/2017 0420   TCO2 15 (L) 09/10/2017 1214   ACIDBASEDEF 7.1 (H) 10/22/2017 0420   O2SAT 96.1 10/22/2017 0420   CBG (last 3)  Recent Labs    10/20/17 1638 10/20/17 2211 10/19/2017 1649  GLUCAP 102* 93 163*    Assessment/Plan: S/P  Procedure(s) (LRB): LEFT VIDEO ASSISTED THORACOSCOPY (VATS) for MEDISTINAL DRAINAGE (Left)  1 doing well overall s/p VATs 2 cultures pending- cont current abx, leukocytosis increased from 26.8k to 27.3k- H/H is improved after blood transfusion 3 chest tube output 190cc/24 hours, no air leak- plan to keep tubes in several days to maximize drainage 4 renal fxn normal- excellent UO 6 CXR showed no pneumothroax, basilar pulmonary infiltrates and bilateral pleural effusions consistent with CHF.  7 pulm toilet and mobilize as able 8 clear liquids, advance diet slowly, having nausea 9 medical management per primary svc 10 Heparin resumed 11 pain not controlled-will add fentanyl 12.5mg  IV q2hrs PRN for breakthrough pain. She also has her PCA.  Patient refusing any pills due to nausea. Phenergan ordered since she gets headaches with Zofran.      LOS: 9 days    Elgie Collard 10/23/2017  CXR clear leave chest tube to suction patient examined and medical record reviewed,agree with above note. Tharon Aquas Trigt III 10/23/2017

## 2017-10-23 NOTE — Progress Notes (Signed)
Md notified of pt's temp 102. Pt has schedule dose of tylenol will give.  Will continue to monitor. Saunders Revel T

## 2017-10-23 NOTE — Progress Notes (Signed)
Md notified of pt coughing up dark phlem. ? Aspiration.  Also changed chest tube dressing first of shift dressing was shadowed  and now had oozed through old dressing.  No air leak noted.  staplles intact upper site.[name]  Split gauze, 4x4 and abd pad applied.  Will continue to monitor. Saunders Revel T

## 2017-10-23 NOTE — Progress Notes (Signed)
Danielle [name]  UKG:[id number] DOB: [birth year] DOA: 10/29/2017 PCP: Iona Beard, MD    Brief Narrative:  71 y.o.F w/ a Hx of Crohn's disease, hiatal hernia, psoriasis, and SBO who underwent a type IV hiatal hernia repair in October 2018. Postoperatively the patient required endotracheal intubation, mechanical ventilation and the use of pressors. She was extubated on postop day #2. She improved clinically and was on parenteral nutrition, however she developed a pelvic fluid collection which required a drain. On 09/11/2017 she had blood cultures positive for Candida glabrata, and the pelvic abscess culture was positive for C. Albicans and C. Glabrata. She was given treatment withparenteral antifungals. She subsequently improved, was able to ambulate with physical therapy, weaned off of parenteral nutrition and was tolerating oral diet. ID recommended to continue Invanz andErasisfor 2 weeks. A PICC line was placed in her right arm and she was ultimately discharged home.  She returned on 10/01/2017 due to intermittent fevers. She was treated empirically and clinically improved to the point that she was able to be d/c home again on 11/1.  She returned to the ED 11/8 with complaints of persistent nausea associated with emesis.   Significant Events: 11/8 readmit 11/11 PICC placed 11/12 thoracentesis 11/5 to OR at [facility] for L VATS for mediastinal drainage     Assessment & Plan:  Severe Sepsis/Mediastinal abscess -CT with reaccumulation of fluid collection in the lower mediastinum. Esophagram showing 2.5 cm area of extrinsic compression upon the esophagus. Possible smaller abscesses posterior margin spleen  -11/12 S/P LEFT Thoracentesis: 780 mL fluid removed: Exudative, Gram stain with WBCs and PMNs -Per Dr. Zenovia Jarred GI note from 11/9 he reviewed CT scan abdomen pelvis discussed with Dr. Ardis Hughs both agree mediastinal abscess complex not amenable to endoscopic drainage. Recommended  cardiothoracic surgery be consulted. -IR consulted: Thoracentesis completed. Per review of EMR not candidate for percutaneous drain placement,  If drain felt necessary consider tertiary care center. -transferred to [facility] to OR for VATS drainage on 11/15 - immediate post-op care per TCTS, but pt to remain on [place]  - S/P VATS drainage on 11/15. Discussed with and management per TCTS team. ID recommends continuing antibiotics and monitor cultures.   Nutrition  -TPA started per Surgery  Pyuria -11/14 Urine culture showed no growth.  Anemia -likely primarily due to severe acute/subacute illness - Fe is quite low, but will avoid loading w/ IV Fe until clear no bacteremia  - EBL 600 mL during VATS procedure but procedure note indicates no blood transfusion. Hemoglobin however has improved from 8.6-11.? Transfused. - Follow CBCs.  Chronic diastolic CHF -follow Is/Os and daily weights . Compensated.  Essential HTN - Soft blood pressure. Monitor closely.  Bilateral upper extremity DVTs - Noted acute right axillary vein thrombosis by venous Doppler 10/02/17. She had PICC line on that side it appears at that time. - Heparin infusion had been held for the procedure. - ID had requested bilateral upper extremity venous Dopplers to evaluate for FUO  and preliminary results show no DVT in right arm but positive DVT of left radial vein. She has left subclavian PICC line.    Hypokalemia Replace as needed to goal of 4.0  DVT prophylaxis: Resumed full dose IV heparin infusion for bilateral upper extremity DVTs. Code Status: FULL CODE Family Communication: None at bedside. Disposition Plan: SDU  Consultants:  TCTS ID Gen Surgery IR  Antimicrobials:  Zosyn 11/8 > Anidulafungin 11/9 > Vanc 11/8 > 11/11 + 11/15 >  Subjective:  Patient reports no fever or chills.   Objective:  Vitals:   10/23/17 0800 10/23/17 0825 10/23/17 1150 10/23/17 1200  BP:  117/61  (!) 105/59  Pulse:   (!) 115  (!) 108  Resp: (!) 35 (!) 35 (!) 30 (!) 24  Temp:  (!) 101.7 F (38.7 C)  99.7 F (37.6 C)  TempSrc:  Oral  Oral  SpO2: 97% 97% 99% 100%  Weight:      Height:         Intake/Output Summary (Last 24 hours) at 10/23/2017 1315 Last data filed at 10/23/2017 1200 Gross per 24 hour  Intake 1308.87 ml  Output 1210 ml  Net 98.87 ml   Filed Weights   10/15/17 1449 10/20/17 1600 10/13/2017 0500  Weight: 55.9 kg (123 lb 3.8 oz) 55.3 kg (121 lb 14.6 oz) 55.8 kg (123 lb 0.3 oz)    Examination: Gen. exam: Resting comfortably, no acute distress Respiratory system: Reduced breath sounds in the bases but otherwise clear to auscultation. No increased work of breathing. Cardiovascular system: S1 and S2 heard, RRR. No JVD, murmurs or pedal edema. Telemetry: Sinus rhythm. Abdomen: Nondistended, soft and nontender. Normal bowel sounds heard. CNS: Alert and oriented. No focal neurological deficits. Extremities: Symmetric 5 x 5 power.  CBC: Recent Labs  Lab 10/17/17 1126 10/18/17 0542 10/19/17 0500 10/19/17 1458 10/20/17 0418 10/27/2017 0321 10/22/17 0400 10/23/17 0513  WBC 28.7* 24.1* 24.3*  --  24.2* 25.6* 26.8* 27.3*  NEUTROABS 18.6* 15.7*  --   --   --   --   --   --   HGB 8.6* 8.0* 7.6* 7.9* 7.6* 8.6* 11.1* 11.3*  HCT 26.2* 24.4* 22.9* 24.4* 23.3* 26.0* 34.0* 35.7*  MCV 86.2 89.4 86.4  --  86.3 85.5 83.5 84.2  PLT 444* 384 360  --  362 328 295 350   Basic Metabolic Panel: Recent Labs  Lab 10/19/17 0500 10/20/17 0418 10/22/2017 0321 10/22/17 0400 10/23/17 0513  NA 136 135 134* 135 135  K 2.9* 3.4* 3.4* 3.6 3.9  CL 112* 112* 109 111 109  CO2 19* 20* 19* 18* 18*  GLUCOSE 119* 109* 119* 205* 85  BUN 8 10 11 13 16   CREATININE 0.94 0.85 0.87 0.77 0.80  CALCIUM 7.2* 7.6* 7.5* 8.0* 7.7*  MG 1.4* 2.4 1.8 1.6* 2.1  PHOS 2.3* 3.1 2.3* 3.0 3.2   GFR: Estimated Creatinine Clearance: 58.5 mL/min (by C-G formula based on SCr of 0.8 mg/dL).  Liver Function Tests: Recent Labs   Lab 10/18/17 0542 10/19/17 0500 10/20/17 0418 11/02/2017 0321 10/23/17 0513  AST 20 45* 117* 60* 28  ALT 12* 20 50 37 27  ALKPHOS 60 77 109 112 111  BILITOT 0.3 0.8 0.5 0.8 0.4  PROT 5.7* 5.7* 6.1* 6.0* 6.9  ALBUMIN 1.6* 1.6* 1.6* 1.5* 1.6*    Coagulation Profile: No results for input(s): INR, PROTIME in the last 168 hours.  HbA1C: Hgb A1c MFr Bld  Date/Time Value Ref Range Status  09/09/2017 06:00 PM 5.0 4.8 - 5.6 % Final    Comment:    (NOTE) Pre diabetes:          5.7%-6.4% Diabetes:              >6.4% Glycemic control for   <7.0% adults with diabetes   04/25/2017 09:32 AM 5.4 4.8 - 5.6 % Final    Comment:    (NOTE)         Pre-diabetes: 5.7 - 6.4  Diabetes: >6.4         Glycemic control for adults with diabetes: <7.0     CBG: Recent Labs  Lab 10/20/17 0754 10/20/17 1206 10/20/17 1638 10/20/17 2211 10/20/2017 1649  GLUCAP 97 88 102* 93 163*    Recent Results (from the past 240 hour(s))  Culture, blood (Routine x 2)     Status: None   Collection Time: 10/24/2017  2:05 PM  Result Value Ref Range Status   Specimen Description BLOOD LEFT FOREARM  Final   Special Requests   Final    BOTTLES DRAWN AEROBIC AND ANAEROBIC Blood Culture results may not be optimal due to an inadequate volume of blood received in culture bottles   Culture   Final    NO GROWTH 5 DAYS Performed at [facility], [place] [address], [place], [place] [address]    Report Status 10/19/2017 FINAL  Final  MRSA PCR Screening     Status: None   Collection Time: 10/23/2017 10:45 PM  Result Value Ref Range Status   MRSA by PCR NEGATIVE NEGATIVE Final    Comment:        The GeneXpert MRSA Assay (FDA approved for NASAL specimens only), is one component of a comprehensive MRSA colonization surveillance program. It is not intended to diagnose MRSA infection nor to guide or monitor treatment for MRSA infections.   Culture, blood (Routine x 2)     Status: None   Collection Time:  10/19/2017 11:28 PM  Result Value Ref Range Status   Specimen Description BLOOD LEFT HAND  Final   Special Requests IN PEDIATRIC BOTTLE Blood Culture adequate volume  Final   Culture   Final    NO GROWTH 5 DAYS Performed at [facility], [place] [address] [place], [place] [address]    Report Status 10/20/2017 FINAL  Final  C difficile quick scan w PCR reflex     Status: None   Collection Time: 10/16/17  2:47 PM  Result Value Ref Range Status   C Diff antigen NEGATIVE NEGATIVE Final   C Diff toxin NEGATIVE NEGATIVE Final   C Diff interpretation No C. difficile detected.  Final  Gastrointestinal Panel by PCR , Stool     Status: None   Collection Time: 10/16/17  2:48 PM  Result Value Ref Range Status   Campylobacter species NOT DETECTED NOT DETECTED Final   Plesimonas shigelloides NOT DETECTED NOT DETECTED Final   Salmonella species NOT DETECTED NOT DETECTED Final   Yersinia enterocolitica NOT DETECTED NOT DETECTED Final   Vibrio species NOT DETECTED NOT DETECTED Final   Vibrio cholerae NOT DETECTED NOT DETECTED Final   Enteroaggregative E coli (EAEC) NOT DETECTED NOT DETECTED Final   Enteropathogenic E coli (EPEC) NOT DETECTED NOT DETECTED Final   Enterotoxigenic E coli (ETEC) NOT DETECTED NOT DETECTED Final   Shiga like toxin producing E coli (STEC) NOT DETECTED NOT DETECTED Final   Shigella/Enteroinvasive E coli (EIEC) NOT DETECTED NOT DETECTED Final   Cryptosporidium NOT DETECTED NOT DETECTED Final   Cyclospora cayetanensis NOT DETECTED NOT DETECTED Final   Entamoeba histolytica NOT DETECTED NOT DETECTED Final   Giardia lamblia NOT DETECTED NOT DETECTED Final   Adenovirus F40/41 NOT DETECTED NOT DETECTED Final   Astrovirus NOT DETECTED NOT DETECTED Final   Norovirus GI/GII NOT DETECTED NOT DETECTED Final   Rotavirus A NOT DETECTED NOT DETECTED Final   Sapovirus (I, II, IV, and V) NOT DETECTED NOT DETECTED Final  Culture, fungus without  smear     Status: None (Preliminary  result)   Collection Time: 10/18/17  3:19 PM  Result Value Ref Range Status   Specimen Description Pleural, L  Final   Special Requests NONE  Final   Culture   Final    NO FUNGUS ISOLATED AFTER 3 DAYS Performed at [facility], [address] [place], [place] [address]    Report Status PENDING  Incomplete  Culture, body fluid-bottle     Status: None (Preliminary result)   Collection Time: 10/18/17  3:19 PM  Result Value Ref Range Status   Specimen Description FLUID LEFT PLEURAL  Final   Special Requests BOTTLES DRAWN AEROBIC AND ANAEROBIC 10CC  Final   Culture   Final    NO GROWTH 4 DAYS Performed at [facility], [place] [address], [place], [place] [address]    Report Status PENDING  Incomplete  Gram stain     Status: None   Collection Time: 10/18/17  3:19 PM  Result Value Ref Range Status   Specimen Description FLUID LEFT PLEURAL  Final   Special Requests NONE  Final   Gram Stain   Final    ABUNDANT WBC PRESENT,BOTH PMN AND MONONUCLEAR NO ORGANISMS SEEN Performed at [facility], [address], [place], [place] [address]    Report Status 10/18/2017 FINAL  Final  Culture, blood (routine x 2)     Status: None (Preliminary result)   Collection Time: 10/19/17  9:45 AM  Result Value Ref Range Status   Specimen Description BLOOD BLOOD RIGHT FOREARM  Final   Special Requests IN PEDIATRIC BOTTLE Blood Culture adequate volume  Final   Culture   Final    NO GROWTH 3 DAYS Performed at [facility], [place] [address] [place], [place] [address]    Report Status PENDING  Incomplete  Culture, blood (routine x 2)     Status: None (Preliminary result)   Collection Time: 10/19/17  9:48 AM  Result Value Ref Range Status   Specimen Description BLOOD RIGHT HAND  Final   Special Requests IN PEDIATRIC BOTTLE Blood Culture adequate volume  Final   Culture   Final    NO GROWTH 3 DAYS Performed at [facility], [place] [address], [place], [place] [address]    Report  Status PENDING  Incomplete  Culture, Urine     Status: None   Collection Time: 10/20/17  5:06 PM  Result Value Ref Range Status   Specimen Description URINE, CATHETERIZED  Final   Special Requests NONE  Final   Culture   Final    NO GROWTH Performed at [facility], [address] [place] [address], [place], [place] [address]    Report Status 10/22/2017 FINAL  Final  Surgical PCR screen     Status: None   Collection Time: 10/13/2017  3:21 AM  Result Value Ref Range Status   MRSA, PCR NEGATIVE NEGATIVE Final   Staphylococcus aureus NEGATIVE NEGATIVE Final    Comment: (NOTE) The Xpert SA Assay (FDA approved for NASAL specimens in patients 23 years of age and older), is one component of a comprehensive surveillance program. It is not intended to diagnose infection nor to guide or monitor treatment.   Body fluid culture     Status: None (Preliminary result)   Collection Time: 10/19/2017  9:02 AM  Result Value Ref Range Status   Specimen Description FLUID  Final   Special Requests MEDISTINAL PATIENT ON FOLLOWING  ZOSYN AND VANC  Final   Gram Stain   Final    MODERATE WBC PRESENT,BOTH PMN AND MONONUCLEAR NO ORGANISMS SEEN    Culture NO GROWTH 2 DAYS  Final   Report Status PENDING  Incomplete  Body fluid culture     Status: None (Preliminary result)   Collection Time: 10/07/2017  9:02 AM  Result Value Ref Range Status   Specimen Description FLUID PLEURAL LEFT  Final   Special Requests PATIENT ON FOLLOWING ZOSYN AND VANC  Final   Gram Stain   Final    MODERATE WBC PRESENT, PREDOMINANTLY MONONUCLEAR NO ORGANISMS SEEN    Culture NO GROWTH 2 DAYS  Final   Report Status PENDING  Incomplete  Acid Fast Smear (AFB)     Status: None   Collection Time: 10/13/2017  9:02 AM  Result Value Ref Range Status   AFB Specimen Processing Concentration  Final   Acid Fast Smear Negative  Final    Comment: (NOTE) Performed At: [facility] [place], [place] [id number] Rush Farmer MD  [id number]    Source (AFB) FLUID  Final    Comment: PLEURAL LEFT PATIENT ON FOLLOWING ZOSYN AND VANC   Acid Fast Smear (AFB)     Status: None   Collection Time: 10/20/2017  9:42 AM  Result Value Ref Range Status   AFB Specimen Processing Concentration  Final   Acid Fast Smear Negative  Final    Comment: (NOTE) Performed At: [facility] [place], [place] [id number] Rush Farmer MD [id number]    Source (AFB) FLUID  Final    Comment: MEDISTINAL PATIENT ON FOLLOWING ZOSYN AND VANC      Scheduled Meds: . acetaminophen  1,000 mg Oral Q6H   Or  . acetaminophen (TYLENOL) oral liquid 160 mg/5 mL  1,000 mg Oral Q6H  . bisacodyl  10 mg Oral Daily  . fentaNYL   Intravenous Q4H  . lip balm  1 application Topical BID  . senna-docusate  1 tablet Oral QHS     LOS: 9 days   OSEI-BONSU,Jayden Kratochvil, MD,  [facility] Pager [phone]  If 7PM-7AM, please contact night-coverage [email or web address] Password [email or web address] 10/23/2017, 1:15 PM

## 2017-10-24 ENCOUNTER — Inpatient Hospital Stay (HOSPITAL_COMMUNITY): Payer: Medicare HMO

## 2017-10-24 DIAGNOSIS — I361 Nonrheumatic tricuspid (valve) insufficiency: Secondary | ICD-10-CM

## 2017-10-24 LAB — ECHOCARDIOGRAM COMPLETE
CHL CUP TV REG PEAK VELOCITY: 268 cm/s
E decel time: 162 msec
E/e' ratio: 9.6
FS: 26 % — AB (ref 28–44)
HEIGHTINCHES: 64.5 in
IV/PV OW: 1.71
LA diam index: 1.76 cm/m2
LA vol A4C: 47.6 ml
LA vol: 51.9 mL
LASIZE: 28 mm
LAVOLIN: 32.6 mL/m2
LDCA: 2.54 cm2
LEFT ATRIUM END SYS DIAM: 28 mm
LV E/e' medial: 9.6
LV E/e'average: 9.6
LV PW d: 7 mm — AB (ref 0.6–1.1)
LV TDI E'LATERAL: 7.8
LV TDI E'MEDIAL: 6.66
LV e' LATERAL: 7.8 cm/s
LVOT diameter: 18 mm
Lateral S' vel: 8.94 cm/s
MV Dec: 162
MV pk A vel: 83.1 m/s
MV pk E vel: 74.9 m/s
MVAP: 4.68 cm2
MVPG: 2 mmHg
P 1/2 time: 47 ms
TAPSE: 13.9 mm
TR max vel: 268 cm/s
WEIGHTICAEL: 1968.27 [oz_av]

## 2017-10-24 LAB — URINALYSIS, ROUTINE W REFLEX MICROSCOPIC
BILIRUBIN URINE: NEGATIVE
Glucose, UA: NEGATIVE mg/dL
Hgb urine dipstick: NEGATIVE
KETONES UR: NEGATIVE mg/dL
Leukocytes, UA: NEGATIVE
NITRITE: NEGATIVE
PH: 6 (ref 5.0–8.0)
PROTEIN: NEGATIVE mg/dL
Specific Gravity, Urine: 1.014 (ref 1.005–1.030)

## 2017-10-24 LAB — BODY FLUID CULTURE
CULTURE: NO GROWTH
Culture: NO GROWTH

## 2017-10-24 LAB — CULTURE, BLOOD (ROUTINE X 2)
CULTURE: NO GROWTH
Culture: NO GROWTH
Special Requests: ADEQUATE
Special Requests: ADEQUATE

## 2017-10-24 LAB — CBC
HEMATOCRIT: 34.5 % — AB (ref 36.0–46.0)
HEMOGLOBIN: 11.3 g/dL — AB (ref 12.0–15.0)
MCH: 27.6 pg (ref 26.0–34.0)
MCHC: 32.8 g/dL (ref 30.0–36.0)
MCV: 84.1 fL (ref 78.0–100.0)
Platelets: 300 10*3/uL (ref 150–400)
RBC: 4.1 MIL/uL (ref 3.87–5.11)
RDW: 19.6 % — AB (ref 11.5–15.5)
WBC: 24.5 10*3/uL — ABNORMAL HIGH (ref 4.0–10.5)

## 2017-10-24 LAB — BASIC METABOLIC PANEL
ANION GAP: 8 (ref 5–15)
BUN: 16 mg/dL (ref 6–20)
CHLORIDE: 109 mmol/L (ref 101–111)
CO2: 18 mmol/L — AB (ref 22–32)
CREATININE: 0.88 mg/dL (ref 0.44–1.00)
Calcium: 7.9 mg/dL — ABNORMAL LOW (ref 8.9–10.3)
GFR calc non Af Amer: >60 mL/min (ref >60)
Glucose, Bld: 91 mg/dL (ref 65–99)
Potassium: 4.2 mmol/L (ref 3.5–5.1)
Sodium: 135 mmol/L (ref 135–145)

## 2017-10-24 LAB — GLUCOSE, CAPILLARY
GLUCOSE-CAPILLARY: 93 mg/dL (ref 65–99)
Glucose-Capillary: 84 mg/dL (ref 65–99)

## 2017-10-24 LAB — SEDIMENTATION RATE: SED RATE: 79 mm/h — AB (ref 0–22)

## 2017-10-24 LAB — HEPARIN LEVEL (UNFRACTIONATED): HEPARIN UNFRACTIONATED: 0.59 [IU]/mL (ref 0.30–0.70)

## 2017-10-24 LAB — MAGNESIUM: Magnesium: 2 mg/dL (ref 1.7–2.4)

## 2017-10-24 MED ORDER — PANTOPRAZOLE SODIUM 40 MG IV SOLR: 40.0000 mg | Freq: Every day | INTRAVENOUS | Status: DC

## 2017-10-24 MED ORDER — TRAVASOL 10 % IV SOLN
INTRAVENOUS | Status: AC
  Administered 2017-10-24: 19:00:00 via INTRAVENOUS
  Filled 2017-10-24 (×2): qty 672

## 2017-10-24 MED ORDER — PANTOPRAZOLE SODIUM 40 MG IV SOLR
40.0000 mg | Freq: Two times a day (BID) | INTRAVENOUS | Status: DC
  Administered 2017-10-24 – 2017-10-25 (×3): 40 mg via INTRAVENOUS
  Filled 2017-10-24 (×3): qty 40

## 2017-10-24 MED ORDER — HEPARIN (PORCINE) IN NACL 100-0.45 UNIT/ML-% IJ SOLN: 800.0000 [IU]/h | INTRAMUSCULAR | Status: AC

## 2017-10-24 MED ORDER — HEPARIN (PORCINE) IN NACL 100-0.45 UNIT/ML-% IJ SOLN
1500.0000 [IU]/h | INTRAMUSCULAR | Status: DC
  Administered 2017-10-24: 1500 [IU]/h via INTRAVENOUS
  Filled 2017-10-24: qty 250

## 2017-10-24 MED ORDER — INSULIN ASPART 100 UNIT/ML ~~LOC~~ SOLN: 0.0000 [IU] | SUBCUTANEOUS | Status: DC

## 2017-10-24 NOTE — Progress Notes (Signed)
  Echocardiogram 2D Echocardiogram has been performed.  Teagan Heidrick G Marieelena Bartko 10/24/2017, 10:32 AM

## 2017-10-24 NOTE — Progress Notes (Addendum)
[address]       [place], [address]             [phone]      3 Days Post-Op Procedure(s) (LRB): LEFT VIDEO ASSISTED THORACOSCOPY (VATS) for MEDISTINAL DRAINAGE (Left) Subjective: Throwing and coughing up bloody mucous this morning. About 50cc total throughout the morning.   Objective: Vital signs in last 24 hours: Temp:  [98.3 F (36.8 C)-102 F (38.9 C)] 99.1 F (37.3 C) (11/18 0722) Pulse Rate:  [95-111] 95 (11/18 0722) Cardiac Rhythm: Normal sinus rhythm (11/18 0910) Resp:  [19-37] 26 (11/18 0836) BP: (103-156)/(58-80) 117/58 (11/18 0722) SpO2:  [94 %-100 %] 97 % (11/18 0836)     Intake/Output from previous day: 11/17 0701 - 11/18 0700 In: 2414.2 [P.O.:240; I.V.:2074.2; IV Piggyback:100] Out: 1410 [Urine:850; Stool:450; Chest Tube:110] Intake/Output this shift: Total I/O In: -  Out: 40 [Chest Tube:40]  General appearance: alert, cooperative and no distress Heart: regular rate and rhythm, S1, S2 normal, no murmur, click, rub or gallop Lungs: clear to auscultation bilaterally Abdomen: soft, non-tender; bowel sounds normal; no masses,  no organomegaly Extremities: extremities normal, atraumatic, no cyanosis or edema Wound: medial incision with some bloody draiange, left thoracotomy incision with some bloody drainage, staples in tact.   Lab Results: Recent Labs    10/23/17 0513 10/24/17 0006  WBC 27.3* 24.5*  HGB 11.3* 11.3*  HCT 35.7* 34.5*  PLT 327 300   BMET:  Recent Labs    10/23/17 0513 10/24/17 0006  NA 135 135  K 3.9 4.2  CL 109 109  CO2 18* 18*  GLUCOSE 85 91  BUN 16 16  CREATININE 0.80 0.88  CALCIUM 7.7* 7.9*    PT/INR: No results for input(s): LABPROT, INR in the last 72 hours. ABG    Component Value Date/Time   PHART 7.317 (L) 10/22/2017 0420   HCO3 17.9 (L) 10/22/2017 0420   TCO2 15 (L) 09/10/2017 1214   ACIDBASEDEF 7.1 (H) 10/22/2017 0420   O2SAT 96.1 10/22/2017 0420   CBG (last 3)  Recent Labs   10/23/2017 1649  GLUCAP 163*    Assessment/Plan: S/P Procedure(s) (LRB): LEFT VIDEO ASSISTED THORACOSCOPY (VATS) for MEDISTINAL DRAINAGE (Left)  1. Episode of hemoptysis this morning with about 50cc of bright red blood and mucous. H and H stable.  2 cultures pending- cont current abx, leukocytosis to 24.5k but spiked a fever last night to tmax 102. Blood cultures ordered. Will follow.  3 chest tube output 110cc/24 hours, no air leak- plan to keep tubes in several days to maximize drainage 4 renal fxn normal- excellent UO 5 CXR showed bilateral mid and lower lung airspace opacities appear mildly worsened and could reflect aspiration pneumonia. Small bilateral pleural effusions, large hiatal hernia, mild cardiomegaly.   6 pulm toilet and mobilize as able 7 clear liquids, advance diet slowly, having nausea 8 medical management per primary svc 9 Heparin resumed for DVT-pharmacy may adjust dose based on episode of hemoptysis.  10 pain not controlled-will add fentanyl 12.5mg  IV q2hrs PRN for breakthrough pain. She also has her PCA.  Patient refusing any pills due to nausea. Phenergan ordered since she gets headaches with Zofran.  Plan: incisions with some bloody drainage, this may be due to the patient being on heparin. Now spitting/coughing up bloody sputum. Primary to work up. Not likely from the surgery since blood is bright red. CXR suggesting possible aspiration pneumonia however lungs sound clear and oxygen saturation has  been fine. Now with tmax of 102 and BC pending. On abx. Continue chest tubes    LOS: 10 days    Danielle [name] 10/24/2017  CXR stable- leave tubes to suction Reduce heparin dose due to clinical bleeding target Xa  0.3 - 0.4 patient examined and medical record reviewed,agree with above note. Danielle [name] 10/24/2017

## 2017-10-24 NOTE — Progress Notes (Signed)
PROGRESS NOTE    Danielle [name]  TDV:[id number] DOB: Apr 25, 1946 DOA: 10/29/2017 PCP: Iona Beard, MD Brief Narrative: 71 y.o.F w/ a Hxof Crohn's disease, hiatal hernia,psoriasis, and SBO who underwent a type IV hiatal hernia repair in October 2018.Postoperatively the patient required endotracheal intubation, mechanical ventilation and the use of pressors. She was extubated on postop day #2. She improved clinically and was on parenteral nutrition, however she developed a pelvic fluid collection which required a drain. On 09/11/2017 she had blood cultures positive for Candida glabrata, and the pelvic abscess culture was positive for C. Albicans and C. Glabrata. She was given treatment withparenteral antifungals. She subsequently improved, was able to ambulate with physical therapy, weaned off of parenteral nutrition and was tolerating oral diet. ID recommended to continue Invanz andErasisfor 2 weeks. A PICC line was placed in her right arm and she was ultimately discharged home.  She returned on 10/01/2017 due to intermittent fevers. She was treated empirically and clinically improved to the point that she was able to be d/c home again on 11/1.  She returned to the ED 11/8 with complaints of persistent nausea associated with emesis.   Significant Events: 11/8 readmit 11/11 PICC placed 11/12 thoracentesis 11/5 to OR at [facility] for L VATS for mediastinal drainage      Assessment & Plan:   Principal Problem:   Septic shock ([place]) Active Problems:   Leukocytosis   Perianal Crohn's disease, with fistula ([place])   Acute renal failure superimposed on stage 3 chronic kidney disease ([place])   Incarcerated hiatal hernia s/p reduction 09/08/2017   Immunosuppressed status (HCC)   Hypotension   Nausea and vomiting   S/P thoracentesis   Anasarca   DVT (deep venous thrombosis) (HCC)   Protein-calorie malnutrition, moderate (HCC)   Mediastinal abscess (HCC)   Mediastinal post-op  seromas   Pleural effusion, left   Patient's noncompliance with intervention / medical advice    Dehydration   Acute embolism and thrombosis of left axillary vein (HCC)  SevereSepsis/Mediastinal abscess -CT with reaccumulation of fluid collection in the lower mediastinum. Esophagram showing 2.5 cm area of extrinsic compression upon the esophagus. Possible smaller abscesses posterior margin spleen  -11/12 S/P LEFTThoracentesis: 780 mL fluid removed: Exudative, Gram stain with WBCs and PMNs -Per Dr. Windy Canny note from 11/9 he reviewed CT scan abdomen pelvis discussed with Dr. Johnny Bridge agree mediastinal abscess complex not amenable to endoscopic drainage. Recommended cardiothoracic surgery be consulted. -IR consulted: Thoracentesis completed. Per review of EMR not candidate for percutaneous drain placement,  If drain felt necessary consider tertiary care center. -transferred to [facility] to OR for VATS drainage on 11/15 - immediate post-op care per TCTS, but pt to remain on [place]  - S/P VATS drainage on 11/15. Discussed with and management per TCTS team. ID recommends continuing antibiotics and monitor cultures.   Hematemesis/hemoptysis on IV heparin lateral upper extremity DVT 09/2017.  It appears  that she had a PICC line in her upper extremity.  Her of the upper extremities shows a positive DVT in the left radial vein at this time.  Obtain chest x-ray see there is any evidence of infiltrates.  Causing her to have this hemoptysis.  I will also place her on Protonix 40 mg IV daily for possible gastritis versus ulcer. Leukocytosis continue Zosyn follow-up chest x-ray.  It appears that patient did spike a temp overnight.  Will obtain 2 sets of blood culture.  Hypertension blood pressure low to low normal.  Monitor. Nutrition started on  TPN Anemia due to chronic illness monitor levels Chronic diastolic CHF stable     DVT prophylaxis: Heparin Code Status full code :Family  Communication: None Disposition Plan:  TBD  Consultants: Cardiothoracic surgery  Procedures: Chest tube video-assisted thoracoscopy for mediastinal drainage on the left chest Antimicrobials: Zosyn Subjective: Coughing up or vomiting blood frank red blood  Objective: Resting in bed Vitals:   10/24/17 0722 10/24/17 0836 10/24/17 1120 10/24/17 1203  BP: (!) 117/58  132/75   Pulse: 95  89   Resp: 19 (!) 26 (!) 23 (!) 27  Temp: 99.1 F (37.3 C)  98.4 F (36.9 C)   TempSrc: Oral  Oral   SpO2: 98% 97% 99% 98%  Weight:      Height:        Intake/Output Summary (Last 24 hours) at 10/24/2017 1343 Last data filed at 10/24/2017 1200 Gross per 24 hour  Intake 2414.21 ml  Output 1030 ml  Net 1384.21 ml   Filed Weights   10/15/17 1449 10/20/17 1600 10/20/2017 0500  Weight: 55.9 kg (123 lb 3.8 oz) 55.3 kg (121 lb 14.6 oz) 55.8 kg (123 lb 0.3 oz)    Examination:  General exam: Appears calm and comfortable  Respiratory system: Clear to auscultation. Respiratory effort normal.chest tube in place.bloody drainage from site of the tube. Cardiovascular system: S1 & S2 heard, RRR. No JVD, murmurs, rubs, gallops or clicks. No pedal edema. Gastrointestinal system: Abdomen is nondistended, soft and nontender. No organomegaly or masses felt. Normal bowel sounds heard. Central nervous system: Alert and oriented. No focal neurological deficits. Extremities: Symmetric 5 x 5 power. Skin: No rashes, lesions or ulcers Psychiatry: Judgement and insight appear normal. Mood & affect appropriate.     Data Reviewed: I have personally reviewed following labs and imaging studies  CBC: Recent Labs  Lab 10/18/17 0542  10/20/17 0418 10/31/2017 0321 10/22/17 0400 10/23/17 0513 10/24/17 0006  WBC 24.1*   < > 24.2* 25.6* 26.8* 27.3* 24.5*  NEUTROABS 15.7*  --   --   --   --   --   --   HGB 8.0*   < > 7.6* 8.6* 11.1* 11.3* 11.3*  HCT 24.4*   < > 23.3* 26.0* 34.0* 35.7* 34.5*  MCV 89.4   < > 86.3 85.5  83.5 84.2 84.1  PLT 384   < > 362 328 295 327 300   < > = values in this interval not displayed.   Basic Metabolic Panel: Recent Labs  Lab 10/19/17 0500 10/20/17 0418 10/29/2017 0321 10/22/17 0400 10/23/17 0513 10/24/17 0006  NA 136 135 134* 135 135 135  K 2.9* 3.4* 3.4* 3.6 3.9 4.2  CL 112* 112* 109 111 109 109  CO2 19* 20* 19* 18* 18* 18*  GLUCOSE 119* 109* 119* 205* 85 91  BUN 8 10 11 13 16 16   CREATININE 0.94 0.85 0.87 0.77 0.80 0.88  CALCIUM 7.2* 7.6* 7.5* 8.0* 7.7* 7.9*  MG 1.4* 2.4 1.8 1.6* 2.1 2.0  PHOS 2.3* 3.1 2.3* 3.0 3.2  --    GFR: Estimated Creatinine Clearance: 53.1 mL/min (by C-G formula based on SCr of 0.88 mg/dL). Liver Function Tests: Recent Labs  Lab 10/18/17 0542 10/19/17 0500 10/20/17 0418 10/16/2017 0321 10/23/17 0513  AST 20 45* 117* 60* 28  ALT 12* 20 50 37 27  ALKPHOS 60 77 109 112 111  BILITOT 0.3 0.8 0.5 0.8 0.4  PROT 5.7* 5.7* 6.1* 6.0* 6.9  ALBUMIN 1.6* 1.6* 1.6* 1.5* 1.6*  No results for input(s): LIPASE, AMYLASE in the last 168 hours. No results for input(s): AMMONIA in the last 168 hours. Coagulation Profile: No results for input(s): INR, PROTIME in the last 168 hours. Cardiac Enzymes: No results for input(s): CKTOTAL, CKMB, CKMBINDEX, TROPONINI in the last 168 hours. BNP (last 3 results) No results for input(s): PROBNP in the last 8760 hours. HbA1C: No results for input(s): HGBA1C in the last 72 hours. CBG: Recent Labs  Lab 10/20/17 0754 10/20/17 1206 10/20/17 1638 10/20/17 2211 10/11/2017 1649  GLUCAP 97 88 102* 93 163*   Lipid Profile: No results for input(s): CHOL, HDL, LDLCALC, TRIG, CHOLHDL, LDLDIRECT in the last 72 hours. Thyroid Function Tests: No results for input(s): TSH, T4TOTAL, FREET4, T3FREE, THYROIDAB in the last 72 hours. Anemia Panel: No results for input(s): VITAMINB12, FOLATE, FERRITIN, TIBC, IRON, RETICCTPCT in the last 72 hours. Sepsis Labs: No results for input(s): PROCALCITON, LATICACIDVEN in the  last 168 hours.  Recent Results (from the past 240 hour(s))  Culture, blood (Routine x 2)     Status: None   Collection Time: 10/10/2017  2:05 PM  Result Value Ref Range Status   Specimen Description BLOOD LEFT FOREARM  Final   Special Requests   Final    BOTTLES DRAWN AEROBIC AND ANAEROBIC Blood Culture results may not be optimal due to an inadequate volume of blood received in culture bottles   Culture   Final    NO GROWTH 5 DAYS Performed at [facility], [place] [address] [place], [place] [address]    Report Status 10/19/2017 FINAL  Final  MRSA PCR Screening     Status: None   Collection Time: 10/08/2017 10:45 PM  Result Value Ref Range Status   MRSA by PCR NEGATIVE NEGATIVE Final    Comment:        The GeneXpert MRSA Assay (FDA approved for NASAL specimens only), is one component of a comprehensive MRSA colonization surveillance program. It is not intended to diagnose MRSA infection nor to guide or monitor treatment for MRSA infections.   Culture, blood (Routine x 2)     Status: None   Collection Time: 11/01/2017 11:28 PM  Result Value Ref Range Status   Specimen Description BLOOD LEFT HAND  Final   Special Requests IN PEDIATRIC BOTTLE Blood Culture adequate volume  Final   Culture   Final    NO GROWTH 5 DAYS Performed at [facility], [place] [address], [place], [place] [address]    Report Status 10/20/2017 FINAL  Final  C difficile quick scan w PCR reflex     Status: None   Collection Time: 10/16/17  2:47 PM  Result Value Ref Range Status   C Diff antigen NEGATIVE NEGATIVE Final   C Diff toxin NEGATIVE NEGATIVE Final   C Diff interpretation No C. difficile detected.  Final  Gastrointestinal Panel by PCR , Stool     Status: None   Collection Time: 10/16/17  2:48 PM  Result Value Ref Range Status   Campylobacter species NOT DETECTED NOT DETECTED Final   Plesimonas shigelloides NOT DETECTED NOT DETECTED Final   Salmonella species NOT DETECTED NOT DETECTED Final    Yersinia enterocolitica NOT DETECTED NOT DETECTED Final   Vibrio species NOT DETECTED NOT DETECTED Final   Vibrio cholerae NOT DETECTED NOT DETECTED Final   Enteroaggregative E coli (EAEC) NOT DETECTED NOT DETECTED Final   Enteropathogenic E coli (EPEC) NOT DETECTED NOT DETECTED Final   Enterotoxigenic E coli (  ETEC) NOT DETECTED NOT DETECTED Final   Shiga like toxin producing E coli (STEC) NOT DETECTED NOT DETECTED Final   Shigella/Enteroinvasive E coli (EIEC) NOT DETECTED NOT DETECTED Final   Cryptosporidium NOT DETECTED NOT DETECTED Final   Cyclospora cayetanensis NOT DETECTED NOT DETECTED Final   Entamoeba histolytica NOT DETECTED NOT DETECTED Final   Giardia lamblia NOT DETECTED NOT DETECTED Final   Adenovirus F40/41 NOT DETECTED NOT DETECTED Final   Astrovirus NOT DETECTED NOT DETECTED Final   Norovirus GI/GII NOT DETECTED NOT DETECTED Final   Rotavirus A NOT DETECTED NOT DETECTED Final   Sapovirus (I, II, IV, and V) NOT DETECTED NOT DETECTED Final  Culture, fungus without smear     Status: None (Preliminary result)   Collection Time: 10/18/17  3:19 PM  Result Value Ref Range Status   Specimen Description Pleural, L  Final   Special Requests NONE  Final   Culture   Final    NO FUNGUS ISOLATED AFTER 3 DAYS Performed at [facility], [address], [place], [place] [address]    Report Status PENDING  Incomplete  Culture, body fluid-bottle     Status: None   Collection Time: 10/18/17  3:19 PM  Result Value Ref Range Status   Specimen Description FLUID LEFT PLEURAL  Final   Special Requests BOTTLES DRAWN AEROBIC AND ANAEROBIC 10CC  Final   Culture   Final    NO GROWTH 5 DAYS Performed at [facility], [place] [address] [place], [place] [address]    Report Status 10/23/2017 FINAL  Final  Gram stain     Status: None   Collection Time: 10/18/17  3:19 PM  Result Value Ref Range Status   Specimen Description FLUID LEFT PLEURAL  Final   Special Requests NONE  Final    Gram Stain   Final    ABUNDANT WBC PRESENT,BOTH PMN AND MONONUCLEAR NO ORGANISMS SEEN Performed at [facility], [place] [address], [place], [place] [address]    Report Status 10/18/2017 FINAL  Final  Culture, blood (routine x 2)     Status: None   Collection Time: 10/19/17  9:45 AM  Result Value Ref Range Status   Specimen Description BLOOD BLOOD RIGHT FOREARM  Final   Special Requests IN PEDIATRIC BOTTLE Blood Culture adequate volume  Final   Culture   Final    NO GROWTH 5 DAYS Performed at [facility], [place] [address], [place], [place] [address]    Report Status 10/24/2017 FINAL  Final  Culture, blood (routine x 2)     Status: None   Collection Time: 10/19/17  9:48 AM  Result Value Ref Range Status   Specimen Description BLOOD RIGHT HAND  Final   Special Requests IN PEDIATRIC BOTTLE Blood Culture adequate volume  Final   Culture   Final    NO GROWTH 5 DAYS Performed at [facility], [place] [address] [place], [place] [address]    Report Status 10/24/2017 FINAL  Final  Culture, Urine     Status: None   Collection Time: 10/20/17  5:06 PM  Result Value Ref Range Status   Specimen Description URINE, CATHETERIZED  Final   Special Requests NONE  Final   Culture   Final    NO GROWTH Performed at [facility], [place] [address] [place], [place] [address]    Report Status 10/22/2017 FINAL  Final  Surgical PCR screen     Status:  None   Collection Time: 10/08/2017  3:21 AM  Result Value Ref Range Status   MRSA, PCR NEGATIVE NEGATIVE Final   Staphylococcus aureus NEGATIVE NEGATIVE Final    Comment: (NOTE) The Xpert SA Assay (FDA approved for NASAL specimens in patients 7 years of age and older), is one component of a comprehensive surveillance program. It is not intended to diagnose infection nor to guide or monitor treatment.   Body fluid culture     Status: None   Collection Time: 10/19/2017  9:02 AM  Result Value Ref Range Status   Specimen Description  FLUID  Final   Special Requests MEDISTINAL PATIENT ON FOLLOWING ZOSYN AND VANC  Final   Gram Stain   Final    MODERATE WBC PRESENT,BOTH PMN AND MONONUCLEAR NO ORGANISMS SEEN    Culture NO GROWTH 3 DAYS  Final   Report Status 10/24/2017 FINAL  Final  Body fluid culture     Status: None   Collection Time: 10/27/2017  9:02 AM  Result Value Ref Range Status   Specimen Description FLUID PLEURAL LEFT  Final   Special Requests PATIENT ON FOLLOWING ZOSYN AND VANC  Final   Gram Stain   Final    MODERATE WBC PRESENT, PREDOMINANTLY MONONUCLEAR NO ORGANISMS SEEN    Culture NO GROWTH 3 DAYS  Final   Report Status 10/24/2017 FINAL  Final  Acid Fast Smear (AFB)     Status: None   Collection Time: 10/29/2017  9:02 AM  Result Value Ref Range Status   AFB Specimen Processing Concentration  Final   Acid Fast Smear Negative  Final    Comment: (NOTE) Performed At: [facility] [place], [place] [id number] Rush Farmer MD [id number]    Source (AFB) FLUID  Final    Comment: PLEURAL LEFT PATIENT ON FOLLOWING ZOSYN AND VANC   Acid Fast Smear (AFB)     Status: None   Collection Time: 10/19/2017  9:42 AM  Result Value Ref Range Status   AFB Specimen Processing Concentration  Final   Acid Fast Smear Negative  Final    Comment: (NOTE) Performed At: [facility] [place], [place] [id number] Rush Farmer MD [id number]    Source (AFB) FLUID  Final    Comment: MEDISTINAL PATIENT ON FOLLOWING ZOSYN AND VANC   Fungus culture, blood     Status: None (Preliminary result)   Collection Time: 10/24/17 12:16 AM  Result Value Ref Range Status   Specimen Description BLOOD LEFT FOOT  Final   Special Requests   Final    BOTTLES DRAWN AEROBIC AND ANAEROBIC Blood Culture adequate volume   Culture PENDING  Incomplete   Report Status PENDING  Incomplete         Radiology Studies: Dg Chest Port 1 View  Result Date: 10/24/2017 CLINICAL DATA:  Acute  onset of fever, vomiting and cough. Concern for aspiration. Patient has left-sided chest tubes. EXAM: PORTABLE CHEST 1 VIEW COMPARISON:  Chest radiograph performed 10/22/2017 FINDINGS: Bilateral mid and lower lung zone airspace opacities appear mildly worsened and could reflect aspiration pneumonia, given clinical concern. Small bilateral pleural effusions are suspected. No pneumothorax is seen, though the lung apices are obscured by the patient's head. Left apical and left basilar chest tubes are unchanged in appearance. The cardiomediastinal silhouette is mildly enlarged. No acute osseous abnormalities are seen. A large hiatal hernia is noted. IMPRESSION: 1. Bilateral mid and lower lung zone airspace opacities appear mildly worsened and could reflect aspiration  pneumonia, given clinical concern. 2. Suspect small bilateral pleural effusions. 3. Mild cardiomegaly. 4. Large hiatal hernia. Electronically Signed   By: Garald Balding M.D.   On: 10/24/2017 00:36   Dg Abd Portable 1v  Result Date: 10/24/2017 CLINICAL DATA:  Acute onset of fever, vomiting and cough. EXAM: PORTABLE ABDOMEN - 1 VIEW COMPARISON:  CT of the abdomen and pelvis performed 10/10/2017 FINDINGS: The visualized bowel gas pattern is unremarkable. Scattered air and stool filled loops of colon are seen; no abnormal dilatation of small bowel loops is seen to suggest small bowel obstruction. No free intra-abdominal air is identified, though evaluation for free air is limited on a single supine view. Postoperative change is noted about the right side of the abdomen and pelvis. The visualized osseous structures are within normal limits; the sacroiliac joints are unremarkable in appearance. IMPRESSION: Unremarkable bowel gas pattern; no free intra-abdominal air seen. Small amount of stool noted in the colon. Electronically Signed   By: Garald Balding M.D.   On: 10/24/2017 00:37        Scheduled Meds: . acetaminophen  1,000 mg Oral Q6H   Or  .  acetaminophen (TYLENOL) oral liquid 160 mg/5 mL  1,000 mg Oral Q6H  . bisacodyl  10 mg Oral Daily  . fentaNYL   Intravenous Q4H  . insulin aspart  0-3 Units Subcutaneous 3 times per day  . lip balm  1 application Topical BID  . pantoprazole (PROTONIX) IV  40 mg Intravenous QHS  . senna-docusate  1 tablet Oral QHS   Continuous Infusions: . anidulafungin Stopped (10/23/17 2012)  . heparin 1,600 Units/hr (10/23/17 2320)  . piperacillin-tazobactam (ZOSYN)  IV Stopped (10/24/17 0911)  . potassium chloride Stopped (10/22/17 1145)  . TPN ADULT (ION) 70 mL/hr at 10/23/17 2000  . TPN CYCLIC-ADULT (ION)       LOS: 10 days     Georgette Shell, MD [facility] If 7PM-7AM, please contact night-coverage [email or web address] Password [email or web address] 10/24/2017, 1:43 PM

## 2017-10-24 NOTE — Progress Notes (Signed)
Tried to encourage pt to get up to chair. Pt refused at this time. Gave pt iv phenergan before tylenol.  Will continue to monitor. Saunders Revel T

## 2017-10-24 NOTE — Progress Notes (Signed)
[place] NOTE   Pharmacy Consult for TPN Indication: Poor PO intake / unable to place repeat G-tube   Patient Measurements: Height: 5' 4.5" (163.8 cm) Weight: 123 lb 0.3 oz (55.8 kg) IBW/kg (Calculated) : 55.85 TPN AdjBW (KG): 59 Body mass index is 20.79 kg/m.  Assessment:  75 yoF re-admit 11/8 with N/V, loose stools (baseline w/Crohn's). Was hospitalized for a month in October and developed fungemia and treated with Invanz and anidulafungin. Re-admitted a week later with intermittent fevers and difficultly swallowing. CT chest showed recurrent mediastinal fluid abscess.  PMH: Crohns, HH, hx SBO  Significant events:  11/15 Left VATs to drain mediastinum   GI: Mediastinal abscess complex, clear liquid diet. Planning to advance diet very slowly. Experienced some nausea yesterday  Endo: CBGs AM blood glucose 91 off SSI. Has not required much insulin during admission.  Insulin requirements in the past 24 hours: 0 unit regular insulin  Lytes: Na 135, K 3.9>4.2, CO2 18, Cl 109, Mg 2 Renal: SCr 0.88, UOP 0.6 mL/kg/hr, +1 L /24 hr, +11.7L since admission Pulm:98% RA; Bilateral chest tube output down to 110 mL / 24 hours, chest tubes to remain in place for several days  Cards: BP soft, HR controlled  Hepatobil: AST/ALT 60/37, Tbili wnl  ID: WBC down to 24.5, Tm 102F, on IV Zosyn and anidulafungin, culture neg so far    Best Practices: SCDs TPN Access: PICC 11/11>> TPN start date: 11/11>>  Nutritional Goals (per RD recommendation on 11/14): Kcal:  1475-1770 (25-30 kcal/kg) Protein:  60-70 grams (1-1.2 grams/kg) Fluid:  >/= 1.6 L/day  Current Nutrition:  Clinimix E 5/15 at a goal rate of 55 ml/hr + 20% fat emulsion at 68ml/hr to provide: 66 g/day protein, 1465 Kcal/day Clear liquid diet   Plan:  Will start to cycle TPN today in anticipation that patient may need to be discharged on TPN. Start at 49 mL/hr x 1 hour, then increase to 99  mL/hr x 16 hours and then decrease to 49 mL/hr x 1 hour, then stop.  TPN provides 67 g of protein, 235 g of dextrose, and 50 g lipids providing 1572 total kcal, which meets ~100% of patient needs.  Dextrose infusion rate is 2.9 mg/kg/min Will start custom (sensitive) SSI until rebound hyperglycemia is ruled out Electrolytes in TPN: Cl:Ac ratio - Max acetate, Na 60 mEq/L, K 30 mEq/L, Mg 10 mEq/L, Phos 20 mmol/L AddMVI,trace elements in TPN Add SSI if blood glucose starts to trend back up  Monitor TPN labs, recheck labs tomorrow,clinical progress    Albertina Parr, PharmD., BCPS Clinical Pharmacist Pager [phone]

## 2017-10-24 NOTE — Progress Notes (Signed)
RN at bedside for morning assessment when patient began to feel nauseous. Patient then had episode of emesis that was bright red in appearance. Rodena Piety, MD at bedside during event. RN discussed with patient that it was not time for PRN medication at this time and other therapeutic interventions implemented. RN will continue to monitor.

## 2017-10-24 NOTE — Progress Notes (Signed)
Md called about stat portable chest xray, pt not able to tolerate all tylenol and vomiting up dark brown thin liquid.  Will get KUB and portable chest STAT. Keep pt NPO for now.  New orders received. Will continue to monitor. Saunders Revel T

## 2017-10-24 NOTE — Progress Notes (Addendum)
ANTICOAGULATION CONSULT NOTE - Follow Up Consult  Pharmacy Consult for Heparin Indication: DVT  Allergies  Allergen Reactions  . Zithromax [Azithromycin] Swelling and Other (See Comments)    Mouth swelling and blisters   . Aspirin Other (See Comments)    Stomach cramping   . Ibuprofen Other (See Comments)    Pt states that she prefers not to take this medication.   . Iron Nausea And Vomiting and Other (See Comments)    FERROUS SULFATE  . Sulfa Antibiotics Nausea And Vomiting    Patient Measurements: Height: 5' 4.5" (163.8 cm) Weight: 123 lb 0.3 oz (55.8 kg) IBW/kg (Calculated) : 55.85 Heparin Dosing Weight: 55.8 kg  Vital Signs: Temp: 98.4 F (36.9 C) (11/18 1120) Temp Source: Oral (11/18 1120) BP: 132/75 (11/18 1120) Pulse Rate: 89 (11/18 1120)  Labs: Recent Labs    10/22/17 0400 10/22/17 2214 10/23/17 0513 10/24/17 0006  HGB 11.1*  --  11.3* 11.3*  HCT 34.0*  --  35.7* 34.5*  PLT 295  --  327 300  HEPARINUNFRC  --  0.51 0.57 0.59  CREATININE 0.77  --  0.80 0.88    Estimated Creatinine Clearance: 53.1 mL/min (by C-G formula based on SCr of 0.88 mg/dL).   Medications:  Scheduled:  . acetaminophen  1,000 mg Oral Q6H   Or  . acetaminophen (TYLENOL) oral liquid 160 mg/5 mL  1,000 mg Oral Q6H  . bisacodyl  10 mg Oral Daily  . fentaNYL   Intravenous Q4H  . insulin aspart  0-3 Units Subcutaneous 3 times per day  . lip balm  1 application Topical BID  . senna-docusate  1 tablet Oral QHS   Infusions:  . anidulafungin Stopped (10/23/17 2012)  . heparin 1,600 Units/hr (10/23/17 2320)  . piperacillin-tazobactam (ZOSYN)  IV Stopped (10/24/17 0911)  . potassium chloride Stopped (10/22/17 1145)  . TPN ADULT (ION) 70 mL/hr at 10/23/17 2000  . TPN CYCLIC-ADULT (ION)      Assessment: 60 yof on apixaban PTA (last dose 11/7) for DVT hx admitted with bilateral UE DVT. Pharmacy consulted to resume heparin s/p VATS 11/15 with TCTS approval.   CBC stable. Noted to have  been coughing up bloody mucous this morning, ~32ml throughout the morning. Heparin is at mid range of goal on  1600 units/hr.   D/w bleeding with CVTS, will reduce heparin to 800 units/hr till 6p then increase back to lower than original rate of 1500 units/hr.    Goal of Therapy:  Heparin level 0.3-0.7 units/ml Monitor platelets by anticoagulation protocol: Yes   Erin Hearing PharmD., BCPS Clinical Pharmacist Pager [phone] 10/24/2017 1:30 PM

## 2017-10-25 ENCOUNTER — Inpatient Hospital Stay (HOSPITAL_COMMUNITY): Payer: Medicare HMO

## 2017-10-25 ENCOUNTER — Encounter (HOSPITAL_COMMUNITY): Payer: Self-pay | Admitting: Radiology

## 2017-10-25 DIAGNOSIS — R509 Fever, unspecified: Secondary | ICD-10-CM

## 2017-10-25 DIAGNOSIS — B999 Unspecified infectious disease: Secondary | ICD-10-CM

## 2017-10-25 LAB — TYPE AND SCREEN
ABO/RH(D): O POS
Antibody Screen: NEGATIVE
UNIT DIVISION: 0
UNIT DIVISION: 0
Unit division: 0
Unit division: 0

## 2017-10-25 LAB — COMPREHENSIVE METABOLIC PANEL
ALK PHOS: 145 U/L — AB (ref 38–126)
ALT: 28 U/L (ref 14–54)
ANION GAP: 6 (ref 5–15)
AST: 32 U/L (ref 15–41)
Albumin: 1.5 g/dL — ABNORMAL LOW (ref 3.5–5.0)
BILIRUBIN TOTAL: 0.3 mg/dL (ref 0.3–1.2)
BUN: 15 mg/dL (ref 6–20)
CO2: 20 mmol/L — ABNORMAL LOW (ref 22–32)
CREATININE: 0.84 mg/dL (ref 0.44–1.00)
Calcium: 7.7 mg/dL — ABNORMAL LOW (ref 8.9–10.3)
Chloride: 109 mmol/L (ref 101–111)
Glucose, Bld: 124 mg/dL — ABNORMAL HIGH (ref 65–99)
Potassium: 3.5 mmol/L (ref 3.5–5.1)
SODIUM: 135 mmol/L (ref 135–145)
TOTAL PROTEIN: 6 g/dL — AB (ref 6.5–8.1)

## 2017-10-25 LAB — GLUCOSE, CAPILLARY
GLUCOSE-CAPILLARY: 60 mg/dL — AB (ref 65–99)
Glucose-Capillary: 120 mg/dL — ABNORMAL HIGH (ref 65–99)
Glucose-Capillary: 70 mg/dL (ref 65–99)
Glucose-Capillary: 117 mg/dL — ABNORMAL HIGH (ref 65–99)

## 2017-10-25 LAB — MAGNESIUM: MAGNESIUM: 1.9 mg/dL (ref 1.7–2.4)

## 2017-10-25 LAB — DIFFERENTIAL
Basophils Absolute: 0.1 10*3/uL (ref 0.0–0.1)
Basophils Relative: 0 %
EOS PCT: 1 %
Eosinophils Absolute: 0.1 10*3/uL (ref 0.0–0.7)
LYMPHS PCT: 18 %
Lymphs Abs: 4.4 10*3/uL — ABNORMAL HIGH (ref 0.7–4.0)
MONO ABS: 2.1 10*3/uL — AB (ref 0.1–1.0)
MONOS PCT: 9 %
Neutro Abs: 18.2 10*3/uL — ABNORMAL HIGH (ref 1.7–7.7)
Neutrophils Relative %: 72 %

## 2017-10-25 LAB — CBC
HCT: 32 % — ABNORMAL LOW (ref 36.0–46.0)
HEMATOCRIT: 29.5 % — AB (ref 36.0–46.0)
Hemoglobin: 10.5 g/dL — ABNORMAL LOW (ref 12.0–15.0)
Hemoglobin: 9.6 g/dL — ABNORMAL LOW (ref 12.0–15.0)
MCH: 27.4 pg (ref 26.0–34.0)
MCH: 27.3 pg (ref 26.0–34.0)
MCHC: 32.5 g/dL (ref 30.0–36.0)
MCHC: 32.8 g/dL (ref 30.0–36.0)
MCV: 84 fL (ref 78.0–100.0)
MCV: 83.3 fL (ref 78.0–100.0)
PLATELETS: 274 10*3/uL (ref 150–400)
Platelets: 291 10*3/uL (ref 150–400)
RBC: 3.51 MIL/uL — AB (ref 3.87–5.11)
RBC: 3.84 MIL/uL — ABNORMAL LOW (ref 3.87–5.11)
RDW: 19.2 % — ABNORMAL HIGH (ref 11.5–15.5)
RDW: 19.6 % — ABNORMAL HIGH (ref 11.5–15.5)
WBC: 24.9 10*3/uL — ABNORMAL HIGH (ref 4.0–10.5)
WBC: 31.8 10*3/uL — ABNORMAL HIGH (ref 4.0–10.5)

## 2017-10-25 LAB — BPAM RBC
BLOOD PRODUCT EXPIRATION DATE: 201812112359
BLOOD PRODUCT EXPIRATION DATE: 201812112359
Blood Product Expiration Date: 201812112359
Blood Product Expiration Date: 201812112359
ISSUE DATE / TIME: 201811150801
ISSUE DATE / TIME: 201811150801
UNIT TYPE AND RH: 5100
UNIT TYPE AND RH: 5100
Unit Type and Rh: 5100
Unit Type and Rh: 5100

## 2017-10-25 LAB — TRIGLYCERIDES: TRIGLYCERIDES: 83 mg/dL (ref <150)

## 2017-10-25 LAB — PHOSPHORUS: PHOSPHORUS: 3.4 mg/dL (ref 2.5–4.6)

## 2017-10-25 MED ORDER — SODIUM CHLORIDE 0.9 % IV SOLN
8.0000 mg/h | INTRAVENOUS | Status: AC
  Administered 2017-10-25 – 2017-10-28 (×7): 8 mg/h via INTRAVENOUS
  Filled 2017-10-25 (×10): qty 80

## 2017-10-25 MED ORDER — ACETAMINOPHEN 10 MG/ML IV SOLN: 1000.0000 mg | Freq: Four times a day (QID) | INTRAVENOUS | Status: DC

## 2017-10-25 MED ORDER — SODIUM CHLORIDE 0.9 % IV SOLN
80.0000 mg | Freq: Once | INTRAVENOUS | Status: AC
  Administered 2017-10-25: 80 mg via INTRAVENOUS
  Filled 2017-10-25: qty 80

## 2017-10-25 MED ORDER — PANTOPRAZOLE SODIUM 40 MG IV SOLR
40.0000 mg | Freq: Two times a day (BID) | INTRAVENOUS | Status: DC
  Administered 2017-10-29 – 2017-11-04 (×15): 40 mg via INTRAVENOUS
  Filled 2017-10-25 (×17): qty 40

## 2017-10-25 MED ORDER — ACETAMINOPHEN 10 MG/ML IV SOLN
1000.0000 mg | Freq: Four times a day (QID) | INTRAVENOUS | Status: AC | PRN
  Administered 2017-10-25 – 2017-10-26 (×4): 1000 mg via INTRAVENOUS
  Filled 2017-10-25 (×5): qty 100

## 2017-10-25 MED ORDER — TRAVASOL 10 % IV SOLN
INTRAVENOUS | Status: AC
  Administered 2017-10-25: 18:00:00 via INTRAVENOUS
  Filled 2017-10-25 (×2): qty 672

## 2017-10-25 MED ORDER — IOPAMIDOL (ISOVUE-300) INJECTION 61%
INTRAVENOUS | Status: AC
  Administered 2017-10-25: 75 mL
  Filled 2017-10-25: qty 75

## 2017-10-25 MED ORDER — ACETAMINOPHEN 10 MG/ML IV SOLN
1000.0000 mg | Freq: Once | INTRAVENOUS | Status: AC
  Administered 2017-10-25: 1000 mg via INTRAVENOUS
  Filled 2017-10-25: qty 100

## 2017-10-25 NOTE — Consult Note (Signed)
[facility] Consult  Referring Provider: Georgette Shell, MD Primary Care Physician:  Iona Beard, MD Primary [place]  Reason for Consultation:  Hematemesis  HPI: Danielle [name] is a 71 y.o. female was admitted on 10/08/2017 with nausea and vomiting. Patient has had prolonged hospitalization since 09/2017 after she had a hiatal hernia repair, developed sepsis with pelvic and mediastinal abscesses. She was also found to have a PICC line induced deep vein thrombosis and was started on anticoagulation. Yesterday she had 4 episodes of vomiting bright red blood around 150 mL as per her nurse and today morning has had 1 episode of coffee-ground emesis. Patient has been nothing by mouth and on total parenteral nutrition since she had her hiatal hernia repair in October. She has a rectal tube draining liquid dark green fluid, no episodes of melena or hematochezia. She complains of abdominal pain, she describes a soreness but denies current nausea or vomiting.    Past Medical History:  Diagnosis Date  . Crohn disease ([place])   . Hiatal hernia 12/07/2015  . Left buttock abscess 04/21/2017  . Psoriasis   . SBO (small bowel obstruction) ([place]) 07/27/2017    Past Surgical History:  Procedure Laterality Date  . ANAL STENOSIS  ~ 2010  . APPENDECTOMY  1980's  . BOWEL RESECTION  2009  . CANCELLED PROCEDURE  06/10/2017   Performed by Milus Banister, MD at [facility]  . CHOLECYSTECTOMY  1990's  . COLONOSCOPY  2013  . COLONOSCOPY WITH PROPOFOL N/A 03/12/2017   Performed by Rogene Houston, MD at [place]  . COLOSTOMY  2009   DEMASO  . COLOSTOMY TAKEDOWN  2009   "only had it for about 1 month" (08/22/2013)  . DIAGNOSTIC LAPAROSCOPIC PLACEMENT OF gastrostomy TUBE AND GASTROPEXY N/A 09/08/2017   Performed by Ralene Ok, MD at [place] N/A 03/12/2017   Performed by Rogene Houston, MD at [place]  . ESOPHAGOGASTRODUODENOSCOPY  (EGD) N/A 05/18/2012   Performed by Rogene Houston, MD at [place]  . ESOPHAGOGASTRODUODENOSCOPY (EGD) N/A 11/11/2011   Performed by Missy Sabins, MD at [place]  . ESOPHAGOGASTRODUODENOSCOPY (EGD) WITH PROPOFOL N/A 03/12/2017   Performed by Rogene Houston, MD at [place]  . Exploratory laparotomy with takedown of G tube, repair gastrostomy abdominal washout N/A 09/10/2017   Performed by Clovis Riley, MD at [facility] ORS  . FLEXIBLE SIGMOIDOSCOPY N/A 11/11/2011   Performed by Missy Sabins, MD at [facility] ENDOSCOPY  . goiter  1999  . IRRIGATION AND DEBRIDEMENT PERIRECTAL ABSCESS N/A 04/21/2017   Performed by Coralie Keens, MD at [place]  . LAPAROSCOPIC LYSIS OF ADHESIONS N/A 09/08/2017   Performed by Ralene Ok, MD at [facility] ORS  . LEFT HEART CATHETERIZATION WITH CORONARY ANGIOGRAM N/A 07/09/2014   Performed by Martinique, Peter M, MD at [facility] CATH LAB  . LEFT VIDEO ASSISTED THORACOSCOPY (VATS) for MEDISTINAL DRAINAGE Left 10/12/2017   Performed by Melrose Nakayama, MD at [place] [facility] [place] N/A 01/31/2015   Performed by Leighton Ruff, MD at [place] PROCTOSCOPY N/A 01/31/2015   Performed by Leighton Ruff, MD at [place]    Prior to Admission medications   Medication Sig Start Date End Date Taking? Authorizing Provider  albuterol (PROVENTIL HFA;VENTOLIN HFA) 108 (90 Base) MCG/ACT inhaler Inhale 2 puffs into the lungs every 6 (six) hours as  needed for wheezing. 12/09/15  Yes Rama, Venetia Maxon, MD  apixaban (ELIQUIS) 5 MG TABS tablet Take 10 mg twice a day for 7 days and then start using 5 mg BID. 10/07/17  Yes Barton Dubois, MD  cyanocobalamin (,VITAMIN B-12,) 1000 MCG/ML injection INJECT 1000 MCG IM ONCE MONTHLY 07/09/17  Yes Rehman, Mechele Dawley, MD  dicyclomine (BENTYL) 10 MG/5ML syrup TAKE 10 MLS (20 MG TOTAL) BY MOUTH 3 (THREE) TIMES DAILY AS NEEDED. Patient taking differently: Take 20 mg 3 (three) times daily as needed by mouth (stomach  cramps).  07/26/17  Yes Rehman, Mechele Dawley, MD  diphenoxylate-atropine (LOMOTIL) 2.5-0.025 MG tablet TAKE 1 TABLET BY MOUTH FOUR TIMES A DAY AS NEEDED Patient taking differently: Take one tablet twice daily. 06/28/17  Yes Setzer, Terri L, NP  feeding supplement (BOOST / RESOURCE BREEZE) LIQD Take 1 Container by mouth 3 (three) times daily between meals. 10/07/17  Yes Barton Dubois, MD  furosemide (LASIX) 20 MG tablet Take 1 tablet (20 mg total) by mouth daily as needed (Take for weight gain of more than 2 pounds in a day or 5 pounds in 2 days.). 06/16/17  Yes Lavina Hamman, MD  HYDROcodone-acetaminophen (NORCO) 7.5-325 MG tablet Take 1 tablet by mouth every 6 (six) hours as needed for moderate pain or severe pain. 09/26/17  Yes Michael Boston, MD  magnesium oxide (MAG-OX) 400 (241.3 Mg) MG tablet Take 1 tablet (400 mg total) by mouth daily. 10/07/17  Yes Barton Dubois, MD  nystatin (NYSTATIN) powder Apply 1 g topically 2 (two) times daily as needed (for itching/irritation).   Yes [provider]  pantoprazole (PROTONIX) 40 MG tablet Take 1 tablet (40 mg total) by mouth daily. 06/16/17  Yes Nita Sells, MD  prochlorperazine (COMPAZINE) 10 MG tablet Take 1 tablet (10 mg total) by mouth every 6 (six) hours as needed for refractory nausea / vomiting. 10/07/17  Yes Barton Dubois, MD  sodium bicarbonate 650 MG tablet Take 650 mg by mouth 2 (two) times daily.   Yes [provider]  spironolactone (ALDACTONE) 50 MG tablet Take 50 mg by mouth daily.  06/23/17  Yes [provider]  acetaminophen (TYLENOL) 325 MG tablet Take 2 tablets (650 mg total) by mouth every 6 (six) hours as needed for mild pain or moderate pain. Patient not taking: Reported on 10/31/2017 10/07/17   Barton Dubois, MD  promethazine (PHENERGAN) 25 MG tablet TAKE 1 TABLET (25 MG TOTAL) BY MOUTH 2 (TWO) TIMES DAILY AS NEEDED FOR NAUSEA OR VOMITING. Patient not taking: Reported on 10/08/2017 09/29/17   Rogene Houston, MD    Current Facility-Administered Medications  Medication Dose Route Frequency Provider Last Rate Last Dose  . acetaminophen (TYLENOL) tablet 1,000 mg  1,000 mg Oral Q6H Gold, Wayne E, PA-C   1,000 mg at 10/24/17 2354   Or  . acetaminophen (TYLENOL) solution 1,000 mg  1,000 mg Oral Q6H Gold, Wayne E, PA-C   650 mg at 10/23/17 2339  . anidulafungin (ERAXIS) 100 mg in sodium chloride 0.9 % 100 mL IVPB  100 mg Intravenous Q24H Michel Bickers, MD   Stopped at 10/24/17 2104  . bisacodyl (DULCOLAX) EC tablet 10 mg  10 mg Oral Daily Gold, Wayne E, PA-C   10 mg at 10/23/17 1206  . diphenhydrAMINE (BENADRYL) injection 12.5 mg  12.5 mg Intravenous Q6H PRN Gold, Wayne E, PA-C       Or  . diphenhydrAMINE (BENADRYL) 12.5 MG/5ML elixir 12.5 mg  12.5 mg Oral  Q6H PRN [name], PA-C      . fentaNYL (SUBLIMAZE) injection 12.5 mcg  12.5 mcg Intravenous Q2H PRN Conte, Tessa N, PA-C      . fentaNYL 40 mcg/mL PCA injection   Intravenous Q4H Gold, Wayne E, PA-C   1,000 mcg at 10/07/2017 1215  . hydrocortisone (ANUSOL-HC) 2.5 % rectal cream 1 application  1 application Topical QID PRN Michael Boston, MD      . insulin aspart (novoLOG) injection 0-3 Units  0-3 Units Subcutaneous 3 times per day Lavenia Atlas, RPH      . lip balm (CARMEX) ointment 1 application  1 application Topical BID Michael Boston, MD   1 application at 88/41/66 2119  . naloxone [facility]) injection 0.4 mg  0.4 mg Intravenous PRN Jadene Pierini E, PA-C       And  . sodium chloride flush (NS) 0.9 % injection 9 mL  9 mL Intravenous PRN Gold, Wayne E, PA-C      . nystatin (MYCOSTATIN/NYSTOP) topical powder 1 g  1 g Topical BID PRN Reubin Milan, MD      . ondansetron [facility]) injection 4 mg  4 mg Intravenous Q6H PRN Jadene Pierini E, PA-C   4 mg at 10/10/2017 2054  . oxyCODONE (ROXICODONE) 5 MG/5ML solution 5-10 mg  5-10 mg Oral Q4H PRN Bodenheimer, Charles A, NP   10 mg at 10/22/17 0048  . pantoprazole (PROTONIX) 80 mg in sodium chloride  0.9 % 100 mL IVPB  80 mg Intravenous Once Georgette Shell, MD      . pantoprazole (PROTONIX) 80 mg in sodium chloride 0.9 % 250 mL (0.32 mg/mL) infusion  8 mg/hr Intravenous Continuous Georgette Shell, MD      . Derrill Memo ON 10/29/2017] pantoprazole (PROTONIX) injection 40 mg  40 mg Intravenous Q12H Georgette Shell, MD      . phenol [facility]) mouth spray 1-2 spray  1-2 spray Mouth/Throat PRN Michael Boston, MD      . piperacillin-tazobactam (ZOSYN) IVPB 3.375 g  3.375 g Intravenous Q8H Reubin Milan, MD   Stopped at 10/25/17 [phone]  . potassium chloride 10 mEq in 50 mL *CENTRAL LINE* IVPB  10 mEq Intravenous Daily PRN Jadene Pierini E, PA-C   Stopped at 10/22/17 1145  . promethazine (PHENERGAN) injection 12.5 mg  12.5 mg Intravenous Q6H PRN Harriet Pho, Tessa N, PA-C   12.5 mg at 10/24/17 2317  . senna-docusate (Senokot-S) tablet 1 tablet  1 tablet Oral QHS Gold, Wayne E, PA-C      . TPN CYCLIC-ADULT (ION)   Intravenous Cyclic-See Admin Instructions Lavenia Atlas, RPH 99 mL/hr at 10/24/17 2044    . TPN CYCLIC-ADULT (ION)   Intravenous Cyclic-See Admin Instructions Reginia Naas, RPH      . traMADol Veatrice Bourbon) tablet 50-100 mg  50-100 mg Oral Q6H PRN Jadene Pierini E, PA-C   100 mg at 10/22/17 1248    Allergies as of 10/08/2017 - Review Complete 10/31/2017  Allergen Reaction Noted  . Zithromax [azithromycin] Swelling and Other (See Comments) 02/26/2014  . Aspirin Other (See Comments) 07/22/2011  . Ibuprofen Other (See Comments) 08/19/2015  . Iron Nausea And Vomiting and Other (See Comments) 09/01/2017  . Sulfa antibiotics Nausea And Vomiting 11/06/2011    Family History  Problem Relation Age of Onset  . Diabetes Mother   . Heart disease Father   . Healthy Sister   . Hypertension Brother   . Colon cancer Brother  died with colon cancer  . Crohn's disease Paternal Aunt     Social History   Socioeconomic History  . Marital status: Single    Spouse name: Not  on file  . Number of children: Not on file  . Years of education: Not on file  . Highest education level: Not on file  Social Needs  . Financial resource strain: Not on file  . Food insecurity - worry: Not on file  . Food insecurity - inability: Not on file  . Transportation needs - medical: Not on file  . Transportation needs - non-medical: Not on file  Occupational History  . Not on file  Tobacco Use  . Smoking status: Former Smoker    Packs/day: 0.12    Years: 4.00    Pack years: 0.48    Types: Cigarettes    Last attempt to quit: 10/05/1996    Years since quitting: 21.0  . Smokeless tobacco: Never Used  . Tobacco comment: 08/22/2013 1 pack every two weeks when she did smoked  Substance and Sexual Activity  . Alcohol use: No    Alcohol/week: 0.0 oz  . Drug use: No  . Sexual activity: Not Currently    Birth control/protection: Post-menopausal, Implant  Other Topics Concern  . Not on file  Social History Narrative   Lives in [place].  Moved from [place] in 2008.  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  LIves with her sister.   No home health services.      Review of Systems: Positive for: GI: Described in detail in HPI.    Gen: anorexia, fatigue, weakness, malaise, involuntary weight loss,Denies any fever, chills, rigors, night sweats,  and sleep disorder CV: chest pain, Denies angina, palpitations, syncope, orthopnea, PND, peripheral edema, and claudication. Resp: Denies dyspnea, cough, sputum, wheezing, coughing up blood. GU : Denies urinary burning, blood in urine, urinary frequency, urinary hesitancy, nocturnal urination, and urinary incontinence. MS: muscle weakness,Denies joint pain or swelling.  Denies  cramps, atrophy.  Derm: Denies rash, itching, oral ulcerations, hives, unhealing ulcers.  Psych: Denies depression, anxiety, memory loss, suicidal ideation, hallucinations,  and confusion. Heme: Denies bruising, bleeding, and enlarged lymph nodes. Neuro:  Denies any headaches,  dizziness, paresthesias. Endo:  Denies any problems with DM, thyroid, adrenal function.  Physical Exam: Vital signs in last 24 hours: Temp:  [98 F (36.7 C)-100.6 F (38.1 C)] 98 F (36.7 C) (11/19 0738) Pulse Rate:  [89-104] 89 (11/19 0331) Resp:  [15-30] 23 (11/19 0830) BP: (127-148)/(59-75) 135/65 (11/19 0331) SpO2:  [96 %-100 %] 100 % (11/19 0830) Last BM Date: 10/25/17  General:   Alert, thinly built, pleasant and cooperative in NAD Head:  Normocephalic and atraumatic. Eyes:  Sclera clear, no icterus.   Mild pallor Ears:  Normal auditory acuity. Nose:  No deformity, discharge,  or lesions. Mouth:  No deformity or lesions.  Oropharynx pink & moist. Neck:  Supple; no masses or thyromegaly. Lungs:  Decreased air entry on the left side, chest tube in place.   No wheezes, crackles, or rhonchi. No acute distress. Heart:  Regular rate and rhythm; no murmurs, clicks, rubs,  or gallops. Extremities:  Without clubbing or edema. Neurologic:  Alert and  oriented x4;  grossly normal neurologically. Skin:  Intact without significant lesions or rashes. Psych:  Alert and cooperative. Normal mood and affect. Abdomen:  Soft, mild generalized tenderness and nondistended. Dressing present and upper abdomen ,No masses, hepatosplenomegaly or hernias noted. Normal bowel sounds, without guarding, and  without rebound.         Lab Results: Recent Labs    10/23/17 0513 10/24/17 0006 10/25/17 0005  WBC 27.3* 24.5* 24.9*  HGB 11.3* 11.3* 9.6*  HCT 35.7* 34.5* 29.5*  PLT 327 300 274   BMET Recent Labs    10/23/17 0513 10/24/17 0006 10/25/17 0005  NA 135 135 135  K 3.9 4.2 3.5  CL 109 109 109  CO2 18* 18* 20*  GLUCOSE 85 91 124*  BUN 16 16 15   CREATININE 0.80 0.88 0.84  CALCIUM 7.7* 7.9* 7.7*   LFT Recent Labs    10/25/17 0005  PROT 6.0*  ALBUMIN 1.5*  AST 32  ALT 28  ALKPHOS 145*  BILITOT 0.3   PT/INR No results for input(s): LABPROT, INR in the last 72  hours.  Studies/Results: Dg Chest 2 View  Result Date: 10/24/2017 CLINICAL DATA:  Hemoptysis EXAM: CHEST  2 VIEW COMPARISON:  Chest x-rays dated 10/24/2016 and 10/22/2017. FINDINGS: Heart size and mediastinal contours are stable. Lines and tubes are stable in the short-term interval. Bibasilar opacities are stable. Probable small left pleural effusion. Hiatal hernia better demonstrated on earlier exams. IMPRESSION: 1. No interval change. Patchy bibasilar opacities are stable, atelectasis versus aspiration. Probable small left pleural effusion. 2. Lines and tubes are stable in position. Electronically Signed   By: Franki Cabot M.D.   On: 10/24/2017 16:36   Dg Chest Port 1 View  Result Date: 10/24/2017 CLINICAL DATA:  Acute onset of fever, vomiting and cough. Concern for aspiration. Patient has left-sided chest tubes. EXAM: PORTABLE CHEST 1 VIEW COMPARISON:  Chest radiograph performed 10/22/2017 FINDINGS: Bilateral mid and lower lung zone airspace opacities appear mildly worsened and could reflect aspiration pneumonia, given clinical concern. Small bilateral pleural effusions are suspected. No pneumothorax is seen, though the lung apices are obscured by the patient's head. Left apical and left basilar chest tubes are unchanged in appearance. The cardiomediastinal silhouette is mildly enlarged. No acute osseous abnormalities are seen. A large hiatal hernia is noted. IMPRESSION: 1. Bilateral mid and lower lung zone airspace opacities appear mildly worsened and could reflect aspiration pneumonia, given clinical concern. 2. Suspect small bilateral pleural effusions. 3. Mild cardiomegaly. 4. Large hiatal hernia. Electronically Signed   By: Garald Balding M.D.   On: 10/24/2017 00:36   Dg Abd Portable 1v  Result Date: 10/24/2017 CLINICAL DATA:  Acute onset of fever, vomiting and cough. EXAM: PORTABLE ABDOMEN - 1 VIEW COMPARISON:  CT of the abdomen and pelvis performed 10/29/2017 FINDINGS: The visualized  bowel gas pattern is unremarkable. Scattered air and stool filled loops of colon are seen; no abnormal dilatation of small bowel loops is seen to suggest small bowel obstruction. No free intra-abdominal air is identified, though evaluation for free air is limited on a single supine view. Postoperative change is noted about the right side of the abdomen and pelvis. The visualized osseous structures are within normal limits; the sacroiliac joints are unremarkable in appearance. IMPRESSION: Unremarkable bowel gas pattern; no free intra-abdominal air seen. Small amount of stool noted in the colon. Electronically Signed   By: Garald Balding M.D.   On: 10/24/2017 00:37    Impression: 1. Hematemesis, hemoglobin 9.6 MCV 84 RDW 19.6 platelet 274(hemoglobin 11.3 yesterday) 2. Incarcerated hiatal hernia status post reduction on 09/08/17 3. Septic shock, leukocytosis(WBC 24.9), status post thoracocentesis(11/12 with 718 mL of fluid removed), left VATS(11/15) for mediastinal drainage, PICC line placement, mild acidosis bicarbonate 20, ESR 79 4. Deep vein  thrombosis of left radial vein, was on IV heparin currently on hold because of hematemesis. 5. Protein calorie malnutrition, on IV TPN 6. Mediastinal abscess, postop seromas, left pleural effusion 7. Chronic diastolic CHF 8. History of Crohn's disease  Plan: No plans for endoscopic intervention/EGD for diagnosis for hematemesis- as patient has mediastinal abscesses, and a chest tube placement status post VATS procedure. Recommend holding heparin, monitoring H&H and transfusing as needed. Continue Protonix drip. Patient on IV Zosyn and IV anidulafungin. Patient on IV TPN. Discussed with patient's hospitalist Dr. Zigmund Daniel, that a repeat CAT scan of the chest may help for further evaluation of mediastinal abscesses and esophageal area post hiatal hernia repair. Hematemesis could be from underlying esophagitis, esophageal ulcer or peptic ulcer disease, and patient  will need to be on PPI. As patient is nothing by mouth did not give Carafate for now.   LOS: 11 days   Ronnette Juniper, M.D.  10/25/2017, 11:38 AM  Pager [phone] If no answer or after 5 PM call [phone]

## 2017-10-25 NOTE — Progress Notes (Signed)
Received order for IV tylenol from MD. RN notified pharmacy for medication d/t medication not being verified at 1818. Medication still not arrived, RN called pharmacy again at [place]. Pt temperature spiked to 103 F orally and ice packs placed under armpits. Tylenol finally arrived and medication started. RN will continue to monitor patient.

## 2017-10-25 NOTE — Progress Notes (Signed)
[address]       [employer] [address]             [phone]      4 Days Post-Op Procedure(s) (LRB): LEFT VIDEO ASSISTED THORACOSCOPY (VATS) for MEDISTINAL DRAINAGE (Left) Subjective: No more hemoptysis or vomiting  Objective: Vital signs in last 24 hours: Temp:  [98 F (36.7 C)-100.6 F (38.1 C)] 98.3 F (36.8 C) (11/19 1143) Pulse Rate:  [89-104] 89 (11/19 0331) Cardiac Rhythm: Normal sinus rhythm (11/19 0830) Resp:  [15-30] 23 (11/19 0830) BP: (127-148)/(59-75) 135/65 (11/19 0331) SpO2:  [96 %-100 %] 100 % (11/19 1143)  Hemodynamic parameters for last 24 hours:    Intake/Output from previous day: 11/18 0701 - 11/19 0700 In: 2555.8 [I.V.:2045.8; IV Piggyback:510] Out: 993 [Urine:300; Stool:200; Chest Tube:170] Intake/Output this shift: Total I/O In: -  Out: 10 [Chest Tube:10]  General appearance: alert, cooperative, fatigued and no distress Heart: regular rate and rhythm Lungs: dim in bases Abdomen: mild diffuse tenderness to palp Extremities: no calf tenderness Wound: dressings with some drainage  Lab Results: Recent Labs    10/24/17 0006 10/25/17 0005  WBC 24.5* 24.9*  HGB 11.3* 9.6*  HCT 34.5* 29.5*  PLT 300 274   BMET:  Recent Labs    10/24/17 0006 10/25/17 0005  NA 135 135  K 4.2 3.5  CL 109 109  CO2 18* 20*  GLUCOSE 91 124*  BUN 16 15  CREATININE 0.88 0.84  CALCIUM 7.9* 7.7*    PT/INR: No results for input(s): LABPROT, INR in the last 72 hours. ABG    Component Value Date/Time   PHART 7.317 (L) 10/22/2017 0420   HCO3 17.9 (L) 10/22/2017 0420   TCO2 15 (L) 09/10/2017 1214   ACIDBASEDEF 7.1 (H) 10/22/2017 0420   O2SAT 96.1 10/22/2017 0420   CBG (last 3)  Recent Labs    10/24/17 1433 10/24/17 1942 10/25/17 0544  GLUCAP 84 93 120*    Meds Scheduled Meds: . acetaminophen  1,000 mg Oral Q6H   Or  . acetaminophen (TYLENOL) oral liquid 160 mg/5 mL  1,000 mg Oral Q6H  . bisacodyl  10 mg Oral Daily  .  fentaNYL   Intravenous Q4H  . insulin aspart  0-3 Units Subcutaneous 3 times per day  . lip balm  1 application Topical BID  . [START ON 10/29/2017] pantoprazole  40 mg Intravenous Q12H  . senna-docusate  1 tablet Oral QHS   Continuous Infusions: . anidulafungin Stopped (10/24/17 2104)  . pantoprazole (PROTONIX) IVPB    . pantoprozole (PROTONIX) infusion    . piperacillin-tazobactam (ZOSYN)  IV Stopped (10/25/17 0916)  . potassium chloride Stopped (10/22/17 1145)  . TPN CYCLIC-ADULT (ION)     PRN Meds:.diphenhydrAMINE **OR** diphenhydrAMINE, fentaNYL (SUBLIMAZE) injection, hydrocortisone, naloxone **AND** sodium chloride flush, nystatin, ondansetron (ZOFRAN) IV, oxyCODONE, phenol, potassium chloride, promethazine, traMADol  Xrays Dg Chest 2 View  Result Date: 10/24/2017 CLINICAL DATA:  Hemoptysis EXAM: CHEST  2 VIEW COMPARISON:  Chest x-rays dated 10/24/2016 and 10/22/2017. FINDINGS: Heart size and mediastinal contours are stable. Lines and tubes are stable in the short-term interval. Bibasilar opacities are stable. Probable small left pleural effusion. Hiatal hernia better demonstrated on earlier exams. IMPRESSION: 1. No interval change. Patchy bibasilar opacities are stable, atelectasis versus aspiration. Probable small left pleural effusion. 2. Lines and tubes are stable in position. Electronically Signed   By: Franki Cabot M.D.   On: 10/24/2017 16:36   Dg Chest [facility]  1 View  Result Date: 10/24/2017 CLINICAL DATA:  Acute onset of fever, vomiting and cough. Concern for aspiration. Patient has left-sided chest tubes. EXAM: PORTABLE CHEST 1 VIEW COMPARISON:  Chest radiograph performed 10/22/2017 FINDINGS: Bilateral mid and lower lung zone airspace opacities appear mildly worsened and could reflect aspiration pneumonia, given clinical concern. Small bilateral pleural effusions are suspected. No pneumothorax is seen, though the lung apices are obscured by the patient's head. Left apical and  left basilar chest tubes are unchanged in appearance. The cardiomediastinal silhouette is mildly enlarged. No acute osseous abnormalities are seen. A large hiatal hernia is noted. IMPRESSION: 1. Bilateral mid and lower lung zone airspace opacities appear mildly worsened and could reflect aspiration pneumonia, given clinical concern. 2. Suspect small bilateral pleural effusions. 3. Mild cardiomegaly. 4. Large hiatal hernia. Electronically Signed   By: Garald Balding M.D.   On: 10/24/2017 00:36   Dg Abd Portable 1v  Result Date: 10/24/2017 CLINICAL DATA:  Acute onset of fever, vomiting and cough. EXAM: PORTABLE ABDOMEN - 1 VIEW COMPARISON:  CT of the abdomen and pelvis performed 10/19/2017 FINDINGS: The visualized bowel gas pattern is unremarkable. Scattered air and stool filled loops of colon are seen; no abnormal dilatation of small bowel loops is seen to suggest small bowel obstruction. No free intra-abdominal air is identified, though evaluation for free air is limited on a single supine view. Postoperative change is noted about the right side of the abdomen and pelvis. The visualized osseous structures are within normal limits; the sacroiliac joints are unremarkable in appearance. IMPRESSION: Unremarkable bowel gas pattern; no free intra-abdominal air seen. Small amount of stool noted in the colon. Electronically Signed   By: Garald Balding M.D.   On: 10/24/2017 00:37    Results for orders placed or performed during the hospital encounter of 10/30/2017  Culture, blood (Routine x 2)     Status: None   Collection Time: 10/11/2017  2:05 PM  Result Value Ref Range Status   Specimen Description BLOOD LEFT FOREARM  Final   Special Requests   Final    BOTTLES DRAWN AEROBIC AND ANAEROBIC Blood Culture results may not be optimal due to an inadequate volume of blood received in culture bottles   Culture   Final    NO GROWTH 5 DAYS Performed at [facility], [place] [address], [place], [place] [address]     Report Status 10/19/2017 FINAL  Final  MRSA PCR Screening     Status: None   Collection Time: 10/19/2017 10:45 PM  Result Value Ref Range Status   MRSA by PCR NEGATIVE NEGATIVE Final    Comment:        The GeneXpert MRSA Assay (FDA approved for NASAL specimens only), is one component of a comprehensive MRSA colonization surveillance program. It is not intended to diagnose MRSA infection nor to guide or monitor treatment for MRSA infections.   Culture, blood (Routine x 2)     Status: None   Collection Time: 10/08/2017 11:28 PM  Result Value Ref Range Status   Specimen Description BLOOD LEFT HAND  Final   Special Requests IN PEDIATRIC BOTTLE Blood Culture adequate volume  Final   Culture   Final    NO GROWTH 5 DAYS Performed at [facility], [place] [address], [place], [place] [address]    Report Status 10/20/2017 FINAL  Final  C difficile quick scan w PCR reflex     Status: None   Collection Time: 10/16/17  2:47 PM  Result Value Ref Range Status   C Diff antigen NEGATIVE NEGATIVE Final   C Diff toxin NEGATIVE NEGATIVE Final   C Diff interpretation No C. difficile detected.  Final  Gastrointestinal Panel by PCR , Stool     Status: None   Collection Time: 10/16/17  2:48 PM  Result Value Ref Range Status   Campylobacter species NOT DETECTED NOT DETECTED Final   Plesimonas shigelloides NOT DETECTED NOT DETECTED Final   Salmonella species NOT DETECTED NOT DETECTED Final   Yersinia enterocolitica NOT DETECTED NOT DETECTED Final   Vibrio species NOT DETECTED NOT DETECTED Final   Vibrio cholerae NOT DETECTED NOT DETECTED Final   Enteroaggregative E coli (EAEC) NOT DETECTED NOT DETECTED Final   Enteropathogenic E coli (EPEC) NOT DETECTED NOT DETECTED Final   Enterotoxigenic E coli (ETEC) NOT DETECTED NOT DETECTED Final   Shiga like toxin producing E coli (STEC) NOT DETECTED NOT DETECTED Final   Shigella/Enteroinvasive E coli (EIEC) NOT DETECTED NOT DETECTED Final   Cryptosporidium  NOT DETECTED NOT DETECTED Final   Cyclospora cayetanensis NOT DETECTED NOT DETECTED Final   Entamoeba histolytica NOT DETECTED NOT DETECTED Final   Giardia lamblia NOT DETECTED NOT DETECTED Final   Adenovirus F40/41 NOT DETECTED NOT DETECTED Final   Astrovirus NOT DETECTED NOT DETECTED Final   Norovirus GI/GII NOT DETECTED NOT DETECTED Final   Rotavirus A NOT DETECTED NOT DETECTED Final   Sapovirus (I, II, IV, and V) NOT DETECTED NOT DETECTED Final  Culture, fungus without smear     Status: None (Preliminary result)   Collection Time: 10/18/17  3:19 PM  Result Value Ref Range Status   Specimen Description Pleural, L  Final   Special Requests NONE  Final   Culture   Final    NO FUNGUS ISOLATED AFTER 3 DAYS Performed at [facility], [address], [place], [place] [address]    Report Status PENDING  Incomplete  Culture, body fluid-bottle     Status: None   Collection Time: 10/18/17  3:19 PM  Result Value Ref Range Status   Specimen Description FLUID LEFT PLEURAL  Final   Special Requests BOTTLES DRAWN AEROBIC AND ANAEROBIC 10CC  Final   Culture   Final    NO GROWTH 5 DAYS Performed at [facility], [place] [address] [place], [place] [address]    Report Status 10/23/2017 FINAL  Final  Gram stain     Status: None   Collection Time: 10/18/17  3:19 PM  Result Value Ref Range Status   Specimen Description FLUID LEFT PLEURAL  Final   Special Requests NONE  Final   Gram Stain   Final    ABUNDANT WBC PRESENT,BOTH PMN AND MONONUCLEAR NO ORGANISMS SEEN Performed at [facility], [place] [address], [place], [place] [address]    Report Status 10/18/2017 FINAL  Final  Culture, blood (routine x 2)     Status: None   Collection Time: 10/19/17  9:45 AM  Result Value Ref Range Status   Specimen Description BLOOD BLOOD RIGHT FOREARM  Final   Special Requests IN PEDIATRIC BOTTLE Blood Culture adequate volume  Final   Culture   Final    NO GROWTH 5 DAYS Performed at [facility], [place] [address] [place], [place] [address]    Report Status 10/24/2017 FINAL  Final  Culture, blood (routine x 2)     Status: None   Collection Time: 10/19/17  9:48  AM  Result Value Ref Range Status   Specimen Description BLOOD RIGHT HAND  Final   Special Requests IN PEDIATRIC BOTTLE Blood Culture adequate volume  Final   Culture   Final    NO GROWTH 5 DAYS Performed at [facility], [address] [place], [place] [address]    Report Status 10/24/2017 FINAL  Final  Culture, Urine     Status: None   Collection Time: 10/20/17  5:06 PM  Result Value Ref Range Status   Specimen Description URINE, CATHETERIZED  Final   Special Requests NONE  Final   Culture   Final    NO GROWTH Performed at [facility], [place] [address] [place], [place] [address]    Report Status 10/22/2017 FINAL  Final  Surgical PCR screen     Status: None   Collection Time: 10/29/2017  3:21 AM  Result Value Ref Range Status   MRSA, PCR NEGATIVE NEGATIVE Final   Staphylococcus aureus NEGATIVE NEGATIVE Final    Comment: (NOTE) The Xpert SA Assay (FDA approved for NASAL specimens in patients 57 years of age and older), is one component of a comprehensive surveillance program. It is not intended to diagnose infection nor to guide or monitor treatment.   Body fluid culture     Status: None   Collection Time: 10/13/2017  9:02 AM  Result Value Ref Range Status   Specimen Description FLUID  Final   Special Requests MEDISTINAL PATIENT ON FOLLOWING ZOSYN AND VANC  Final   Gram Stain   Final    MODERATE WBC PRESENT,BOTH PMN AND MONONUCLEAR NO ORGANISMS SEEN    Culture NO GROWTH 3 DAYS  Final   Report Status 10/24/2017 FINAL  Final  Body fluid culture     Status: None   Collection Time: 10/22/2017  9:02 AM  Result Value Ref Range Status   Specimen Description FLUID PLEURAL LEFT  Final   Special Requests PATIENT ON FOLLOWING ZOSYN AND VANC  Final   Gram Stain   Final    MODERATE WBC PRESENT,  PREDOMINANTLY MONONUCLEAR NO ORGANISMS SEEN    Culture NO GROWTH 3 DAYS  Final   Report Status 10/24/2017 FINAL  Final  Fungus Culture With Stain     Status: None (Preliminary result)   Collection Time: 10/12/2017  9:02 AM  Result Value Ref Range Status   Fungus Stain Final report  Final    Comment: (NOTE) Performed At: [facility] [place], [place] [id number] Rush Farmer MD [id number]    Fungus (Mycology) Culture PENDING  Incomplete   Fungal Source FLUID  Final    Comment: PLEURAL LEFT PATIENT ON FOLLOWING ZOSYN AND VANC   Acid Fast Smear (AFB)     Status: None   Collection Time: 11/02/2017  9:02 AM  Result Value Ref Range Status   AFB Specimen Processing Concentration  Final   Acid Fast Smear Negative  Final    Comment: (NOTE) Performed At: [facility] [place], [place] [id number] Rush Farmer MD [id number]    Source (AFB) FLUID  Final    Comment: PLEURAL LEFT PATIENT ON FOLLOWING ZOSYN AND VANC   Fungus Culture Result     Status: None   Collection Time: 10/30/2017  9:02 AM  Result Value Ref Range Status   Result 1 Comment  Final    Comment: (NOTE) KOH/Calcofluor preparation:  no fungus observed. Performed At: [facility] [address] [place], [place] [id number] Perlie Gold  Derinda Late MD AL:[id number]   Fungus Culture With Stain     Status: None (Preliminary result)   Collection Time: 10/23/2017  9:42 AM  Result Value Ref Range Status   Fungus Stain Final report  Final    Comment: (NOTE) Performed At: [facility] [place], [place] [id number] Rush Farmer MD AS:[id number]    Fungus (Mycology) Culture PENDING  Incomplete   Fungal Source FLUID  Final    Comment: MEDISTINAL PATIENT ON FOLLOWING ZOSYN AND VANC    Acid Fast Smear (AFB)     Status: None   Collection Time: 10/07/2017  9:42 AM  Result Value Ref Range Status   AFB Specimen Processing Concentration  Final   Acid Fast  Smear Negative  Final    Comment: (NOTE) Performed At: [facility] [place], [place] [id number] Rush Farmer MD [id number]    Source (AFB) FLUID  Final    Comment: MEDISTINAL PATIENT ON FOLLOWING ZOSYN AND VANC   Fungus Culture Result     Status: None   Collection Time: 10/24/2017  9:42 AM  Result Value Ref Range Status   Result 1 Comment  Final    Comment: (NOTE) KOH/Calcofluor preparation:  no fungus observed. Performed At: [facility] [place], [place] [id number] Rush Farmer MD [id number]   Culture, blood (routine x 2)     Status: None (Preliminary result)   Collection Time: 10/24/17 12:06 AM  Result Value Ref Range Status   Specimen Description BLOOD LEFT FOOT  Final   Special Requests   Final    BOTTLES DRAWN AEROBIC AND ANAEROBIC Blood Culture adequate volume   Culture NO GROWTH 1 DAY  Final   Report Status PENDING  Incomplete  Culture, blood (routine x 2)     Status: None (Preliminary result)   Collection Time: 10/24/17 12:16 AM  Result Value Ref Range Status   Specimen Description BLOOD RIGHT FOOT  Final   Special Requests   Final    BOTTLES DRAWN AEROBIC ONLY Blood Culture results may not be optimal due to an inadequate volume of blood received in culture bottles   Culture NO GROWTH 1 DAY  Final   Report Status PENDING  Incomplete  Fungus culture, blood     Status: None (Preliminary result)   Collection Time: 10/24/17 12:16 AM  Result Value Ref Range Status   Specimen Description BLOOD LEFT FOOT  Final   Special Requests   Final    BOTTLES DRAWN AEROBIC AND ANAEROBIC Blood Culture adequate volume   Culture NO GROWTH 1 DAY  Final   Report Status PENDING  Incomplete  Culture, blood (routine x 2)     Status: None (Preliminary result)   Collection Time: 10/24/17  3:50 PM  Result Value Ref Range Status   Specimen Description BLOOD RIGHT FOOT  Final   Special Requests IN PEDIATRIC BOTTLE Blood Culture adequate  volume  Final   Culture NO GROWTH < 24 HOURS  Final   Report Status PENDING  Incomplete    Assessment/Plan: S/P Procedure(s) (LRB): LEFT VIDEO ASSISTED THORACOSCOPY (VATS) for MEDISTINAL DRAINAGE (Left)  1 stable 2 Temp curve improving, leukocytosis is stable- ID is assisting with ABX management 3 H/H has dropped, no further hemoptysis- monitor closely clinically. GI consulted for hematemesis. Primary svc is managing heparin for DVT's 4 conts TPN- wouldn't advance diet currently 5 keep CT's for abscess drainage   LOS: 11 days    [name] 10/25/2017

## 2017-10-25 NOTE — Progress Notes (Signed)
4 Days Post-Op Procedure(s) (LRB): LEFT VIDEO ASSISTED THORACOSCOPY (VATS) for MEDISTINAL DRAINAGE (Left) Subjective: Some pain from chest tubes  Objective: Vital signs in last 24 hours: Temp:  [98 F (36.7 C)-100.6 F (38.1 C)] 98.3 F (36.8 C) (11/19 1143) Pulse Rate:  [89-104] 89 (11/19 0331) Cardiac Rhythm: Normal sinus rhythm (11/19 0830) Resp:  [15-30] 23 (11/19 0830) BP: (127-148)/(59-75) 135/65 (11/19 0331) SpO2:  [96 %-100 %] 100 % (11/19 1143)  Hemodynamic parameters for last 24 hours:    Intake/Output from previous day: 11/18 0701 - 11/19 0700 In: 2555.8 [I.V.:2045.8; IV Piggyback:510] Out: 920 [Urine:300; Stool:200; Chest Tube:170] Intake/Output this shift: Total I/O In: -  Out: 10 [Chest Tube:10]  General appearance: chronically ill appearing Heart: regular rate and rhythm Lungs: diminished breath sounds bibasilar L > R no air leak, blood tinged drainage form CT  Lab Results: Recent Labs    10/24/17 0006 10/25/17 0005  WBC 24.5* 24.9*  HGB 11.3* 9.6*  HCT 34.5* 29.5*  PLT 300 274   BMET:  Recent Labs    10/24/17 0006 10/25/17 0005  NA 135 135  K 4.2 3.5  CL 109 109  CO2 18* 20*  GLUCOSE 91 124*  BUN 16 15  CREATININE 0.88 0.84  CALCIUM 7.9* 7.7*    PT/INR: No results for input(s): LABPROT, INR in the last 72 hours. ABG    Component Value Date/Time   PHART 7.317 (L) 10/22/2017 0420   HCO3 17.9 (L) 10/22/2017 0420   TCO2 15 (L) 09/10/2017 1214   ACIDBASEDEF 7.1 (H) 10/22/2017 0420   O2SAT 96.1 10/22/2017 0420   CBG (last 3)  Recent Labs    10/24/17 1433 10/24/17 1942 10/25/17 0544  GLUCAP 84 93 120*    Assessment/Plan: S/P Procedure(s) (LRB): LEFT VIDEO ASSISTED THORACOSCOPY (VATS) for MEDISTINAL DRAINAGE (Left) -POD # 4 VATS for drainage of mediastinal abscess  Drainage from CT moderate- will keep both in place for now Mediastinal drain will stay in until taking POs Hematemesis- followed by GI Hgb down from 34 to  29 Fever trending down   LOS: 11 days    Melrose Nakayama 10/25/2017

## 2017-10-25 NOTE — Progress Notes (Signed)
Nutrition Follow-up  DOCUMENTATION CODES:   Not applicable  INTERVENTION:   -TPN management per pharmacy -RD will follow for diet advancement and supplement as appropriate  NUTRITION DIAGNOSIS:   Inadequate oral intake related to altered GI function as evidenced by (TPN dependent).  Ongoing   GOAL:   Patient will meet greater than or equal to 90% of their needs  Met with TPN  MONITOR:   PO intake, Diet advancement, Labs, Weight trends, Skin, I & O's  REASON FOR ASSESSMENT:   Consult New TPN/TNA  ASSESSMENT:   71 y.o. female with medical history significant of Crohn's disease, hiatal hernia, left buttock abscess, psoriasis, small bowel obstruction who has been admitted twice in the past 5 weeks, first from 10/03-10/21 after undergoing type IV hiatal hernia repair. She improved clinically and was on TPN, however she developed a pelvic fluid collection which required a drain placement. She subsequently improved and was weaned off of TPN and was tolerating an oral diet. ID recommended to continue Invanz and Erasis for 2 weeks. A PICC line was placed in her right upper arm and she was subsequently discharged home. She returned on 10/26 due to intermittent fevers. She presented on 11/8 with complaints of persistent nausea associated with about 3 episodes of emesis daily. She has also had loose stools, but stated that this is her baseline since she suffers from Crohn's disease.   11/12- lt thoracentesis (780 ml removed) 11/15- s/p VATS  Spoke with pt at bedside, who had flat affect today. She shares that she feels stronger than last week. She is consuming minimal liquids; she estimates she is consuming 1 Boost Breeze supplement daily and almost nothing off of her clear liquid meal trays. However, noted that supplements were d/c on 10/17/2017.   Pt remains TPN-dependent. Per pharmacy note, plan to continue cyclic TPN. Infuse 1,680 mL over 16 hrs : 56 mL/hr x 1 hr, then 112 mL/hr x 14  hrs, then 56 mL/hr x 1 hr. TPN provides 1572 kcals and 67g of protein, meeting 100% of estimated kcal needs and 89% of estimated protein needs.  Labs reviewed: CBGS: 93-120 (inpatient orders for glycemic control 0-3 units TID).   Diet Order:  Diet clear liquid Room service appropriate? Yes; Fluid consistency: Thin TPN CYCLIC-ADULT (ION)  EDUCATION NEEDS:   Not appropriate for education at this time  Skin:  Skin Assessment: Skin Integrity Issues: Skin Integrity Issues:: Other (Comment) Incisions: abdomen Other: chest tubes  Last BM:  10/24/17 (200 ml via rectal tube)  Height:   Ht Readings from Last 1 Encounters:  10/28/2017 5' 4.5" (1.638 m)    Weight:   Wt Readings from Last 1 Encounters:  10/31/2017 123 lb 0.3 oz (55.8 kg)    Ideal Body Weight:  55.68 kg  BMI:  Body mass index is 20.79 kg/m.  Estimated Nutritional Needs:   Kcal:  1500-1700  Protein:  75-90 grams  Fluid:  > 1.5 L    Thanh Mottern A. Jimmye Norman, RD, LDN, CDE Pager: [phone] After hours Pager: [phone]

## 2017-10-25 NOTE — Progress Notes (Signed)
Patient with temperature of 102.2 orally. Pt currently refusing to take any medications PO including tylenol. RN educated on importance and pt continued to refuse. Rodena Piety, MD paged about patient status. RN will continue to monitor.

## 2017-10-25 NOTE — Progress Notes (Signed)
Notified Md.  Danielle [name] pt PRN med for nausea before Tylenol pt still got sick.  Pt stated " I don't want to take anything else by mouth"   Pt has schedule Tylenol at 0600. Will continue to monitor. Saunders Revel T

## 2017-10-25 NOTE — Progress Notes (Signed)
Wasted 39mL of Fentanyl from PCA syringe with Bethena Roys, Therapist, sports.   Tressie Ellis, RN

## 2017-10-25 NOTE — Progress Notes (Signed)
Notified pharmacy, [facility] and CVTS.  hgb now 9.3 down from 11.3. Pt had coffee looking emesis after po tylenol given.  Drew am lab early.  IV Tylenol given.  Will D/C heparin and get 0800 CBC.  Will continue to monitor. Saunders Revel T

## 2017-10-25 NOTE — Progress Notes (Signed)
Danielle [name] for Infectious Disease    Date of Admission:  10/13/2017   Total days of antibiotics 10        Day 10 piptazo        Day 14 anidula   ID: Danielle [name] is a 71 y.o. female  w/ a Hxof Crohn's disease, hiatal hernia,psoriasis, and SBO who underwent a type IV hiatal hernia repair on 09/08/2017.She improved clinically and was on parenteral nutrition, however she developed a pelvic fluid collection which required a drain. On 09/11/2017 she had blood cultures positive for Candida glabrata, and the pelvic abscess culture was positive for C. Albicans and C. Glabrata discharged on anidulafungin plus ertapenem for polymicrobial intra abd infection plus candidemia. She was rehospitalizedf rom 10/26-11/1, then again admitted on 11/8 for persistent nausea found to have mediastinal fluid collection/abscess. She was empirically started back on piptazo plus anidulafungin s/p VATS on 11/15, 11/12 thoracentesis, and 11/11 picc line placement   Principal Problem:   Septic shock (HCC) Active Problems:   Leukocytosis   Perianal Crohn's disease, with fistula ([place])   Acute renal failure superimposed on stage 3 chronic kidney disease ([place])   Incarcerated hiatal hernia s/p reduction 09/08/2017   Immunosuppressed status (HCC)   Hypotension   Nausea and vomiting   S/P thoracentesis   Anasarca   DVT (deep venous thrombosis) (HCC)   Protein-calorie malnutrition, moderate (HCC)   Mediastinal abscess (HCC)   Mediastinal post-op seromas   Pleural effusion, left   Patient's noncompliance with intervention / medical advice    Dehydration   Acute embolism and thrombosis of left axillary vein (HCC)    Subjective: Patient had 4 episodes of hematemesis, she had been on heparin for picc line related DVT.   ROS: She denies fever,but reports having abdominal pain, weakness  Medications:  . acetaminophen  1,000 mg Oral Q6H   Or  . acetaminophen (TYLENOL) oral liquid 160 mg/5 mL  1,000 mg Oral  Q6H  . bisacodyl  10 mg Oral Daily  . fentaNYL   Intravenous Q4H  . insulin aspart  0-3 Units Subcutaneous 3 times per day  . lip balm  1 application Topical BID  . [START ON 10/29/2017] pantoprazole  40 mg Intravenous Q12H  . senna-docusate  1 tablet Oral QHS    Objective: Vital signs in last 24 hours: Temp:  [98 F (36.7 C)-100.6 F (38.1 C)] 98.3 F (36.8 C) (11/19 1143) Pulse Rate:  [89-104] 89 (11/19 0331) Resp:  [15-30] 23 (11/19 0830) BP: (127-148)/(59-75) 135/65 (11/19 0331) SpO2:  [96 %-100 %] 100 % (11/19 1143) Physical Exam  Constitutional:  oriented to person, place, and time. Appears chronically ill. No distress.  HENT: [place]/AT, PERRLA, no scleral icterus Mouth/Throat: Oropharynx is clear and moist. No oropharyngeal exudate.  Cardiovascular: tachy, regular rhythm and normal heart sounds. Exam reveals no gallop and no friction rub.  No murmur heard.  Pulmonary/Chest: Effort normal and breath sounds normal, except decreased on the left. No respiratory distress.  has no wheezes. 2 chest tubes from VATS are in place, serosanginous drainage Abdominal: Soft. Bowel sounds are decrease.  exhibits no distension. There is mild epigastric tenderness. Dressing in epigastric region, rectal tube in place Neurological: alert and oriented to person, place, and time.  Skin: Skin is warm and dry. No rash noted. No erythema.  Psychiatric: a normal mood and affect.  behavior is normal.    Lab Results Recent Labs    10/24/17 0006 10/25/17 0005  WBC  24.5* 24.9*  HGB 11.3* 9.6*  HCT 34.5* 29.5*  NA 135 135  K 4.2 3.5  CL 109 109  CO2 18* 20*  BUN 16 15  CREATININE 0.88 0.84   Liver Panel Recent Labs    10/23/17 0513 10/25/17 0005  PROT 6.9 6.0*  ALBUMIN 1.6* 1.5*  AST 28 32  ALT 27 28  ALKPHOS 111 145*  BILITOT 0.4 0.3   Sedimentation Rate Recent Labs    10/24/17 0006  ESRSEDRATE 79*   C-Reactive Protein No results for input(s): CRP in the last 72  hours.  Microbiology: 11/18 blood cx ngtd, 11/18 fungal culture ngtd 11/15 pleural fluid ngtd Studies/Results: Dg Chest 2 View  Result Date: 10/24/2017 CLINICAL DATA:  Hemoptysis EXAM: CHEST  2 VIEW COMPARISON:  Chest x-rays dated 10/24/2016 and 10/22/2017. FINDINGS: Heart size and mediastinal contours are stable. Lines and tubes are stable in the short-term interval. Bibasilar opacities are stable. Probable small left pleural effusion. Hiatal hernia better demonstrated on earlier exams. IMPRESSION: 1. No interval change. Patchy bibasilar opacities are stable, atelectasis versus aspiration. Probable small left pleural effusion. 2. Lines and tubes are stable in position. Electronically Signed   By: Franki Cabot M.D.   On: 10/24/2017 16:36   Dg Chest Port 1 View  Result Date: 10/24/2017 CLINICAL DATA:  Acute onset of fever, vomiting and cough. Concern for aspiration. Patient has left-sided chest tubes. EXAM: PORTABLE CHEST 1 VIEW COMPARISON:  Chest radiograph performed 10/22/2017 FINDINGS: Bilateral mid and lower lung zone airspace opacities appear mildly worsened and could reflect aspiration pneumonia, given clinical concern. Small bilateral pleural effusions are suspected. No pneumothorax is seen, though the lung apices are obscured by the patient's head. Left apical and left basilar chest tubes are unchanged in appearance. The cardiomediastinal silhouette is mildly enlarged. No acute osseous abnormalities are seen. A large hiatal hernia is noted. IMPRESSION: 1. Bilateral mid and lower lung zone airspace opacities appear mildly worsened and could reflect aspiration pneumonia, given clinical concern. 2. Suspect small bilateral pleural effusions. 3. Mild cardiomegaly. 4. Large hiatal hernia. Electronically Signed   By: Garald Balding M.D.   On: 10/24/2017 00:36   Dg Abd Portable 1v  Result Date: 10/24/2017 CLINICAL DATA:  Acute onset of fever, vomiting and cough. EXAM: PORTABLE ABDOMEN - 1 VIEW  COMPARISON:  CT of the abdomen and pelvis performed 10/13/2017 FINDINGS: The visualized bowel gas pattern is unremarkable. Scattered air and stool filled loops of colon are seen; no abnormal dilatation of small bowel loops is seen to suggest small bowel obstruction. No free intra-abdominal air is identified, though evaluation for free air is limited on a single supine view. Postoperative change is noted about the right side of the abdomen and pelvis. The visualized osseous structures are within normal limits; the sacroiliac joints are unremarkable in appearance. IMPRESSION: Unremarkable bowel gas pattern; no free intra-abdominal air seen. Small amount of stool noted in the colon. Electronically Signed   By: Garald Balding M.D.   On: 10/24/2017 00:37     [id number] F with complications from incarcerated hiatal hernia repair s/p reduction on 10/3,where she developed candidemia, pelvic abscess infection s/p drain. Now found to have residual mediastinal abscess vs post op seroma s/p VATS on 11/15 on anidulafungin plus piptazo. Still has elevated WBC of 24K plus fevers despite undergoing VATs - continue on both antimicrobials for now - agree with primary team for repeat imaging, consider also adding abd/pelvis to chest imaging to see if there is findings  to explain ongoing fevers and leukocytosis  Fever = trending down. Cultures have been drawn. No need to change abtx at this time  Hematemesis = being followed closely for further events. rec repeat Hgb/HCt to ensure it is stable  [facility], [facility] Cell: [phone] Pager: [phone]  10/25/2017, 11:51 AM

## 2017-10-25 NOTE — Progress Notes (Signed)
[place] NOTE   Pharmacy Consult for TPN Indication: Poor PO intake / unable to place repeat G-tube   Patient Measurements: Height: 5' 4.5" (163.8 cm) Weight: 123 lb 0.3 oz (55.8 kg) IBW/kg (Calculated) : 55.85 TPN AdjBW (KG): 59 Body mass index is 20.79 kg/m.  Assessment:  25 yoF re-admit 11/8 with N/V, loose stools (baseline w/Crohn's). Was hospitalized for a month in October and developed fungemia and treated with Invanz and anidulafungin. Re-admitted a week later with intermittent fevers and difficultly swallowing. CT chest showed recurrent mediastinal fluid abscess.  PMH: Crohns, HH, hx SBO  Significant events:  11/15 Left VATs to drain mediastinum   GI: Mediastinal abscess complex, has been advanced to full liquid diet but has nausea and vomiting with almost anything PO. Had 4 episodes of emesis in last 24 hrs but volume not charted. RN reports coffee ground emesis overnight so heparin stopped, but now restarting in the morning. Patient has poor appetite. Albumin low at 1.5. Last BM was 11/18. Endo: CBGs controled (80-120s) on SSI. Has not really insulin during admission.  Insulin requirements in the past 24 hours: 0 unit regular insulin  Lytes: wnl exc K borderline low at 3.5. CO2 up to 20. CoCa 9.7. Renal: SCr stable, UOP down a little but not all charted. Net positive this admit but I/Os not all charted. Pulm: RA; Bilateral chest tube output at 170 mL / 24 hours, chest tubes to remain in place for several days  Cards: BP mostly controlled, HR 80-100s Hepatobil: AST/ALT and Tbili wnl. Trig 83 ID: Continues on Zosyn and anidulafungin for mediastinal abscess. Tmax of 100.6 yesteday, WBC down to 24.9. Drain in place. Cultures negative so far  Best Practices: SCDs TPN Access: PICC 11/11>> TPN start date: 11/11>>  Nutritional Goals (per RD recommendation on 11/14): Kcal:  1475-1770 (25-30 kcal/kg) Protein:  60-70 grams (1-1.2  grams/kg) Fluid:  >/= 1.6 L/day  Current Nutrition:  Full Liquid (appetite poor) Custom TPN  Plan:  Continue cyclic TPN. Infuse 1,680 mL over 16 hrs : 56 mL/hr x 1 hr, then 112 mL/hr x 14 hrs, then 56 mL/hr x 1 hr.  TPN provides 67 g of protein, 235 g of dextrose, and 50 g lipids providing 1572 total kcal, which meets ~100% of patient needs.  Electrolytes in TPN: Max acetate, increase potassium slightly AddMVI,trace elements in TPN Continue custom (sensitive) SSI until rebound hyperglycemia is ruled out Monitor TPN labs, clinical progress  F/U ability to tolerate PO diet   Elenor Quinones, PharmD, [facility] Clinical Pharmacist Pager [phone] 10/25/2017 7:31 AM

## 2017-10-25 NOTE — Op Note (Signed)
[name], [name] NO.:  [id number]  MEDICAL RECORD NO.:  [id number]  LOCATION:  3846                         FACILITY:  [facility]  PHYSICIAN:  Revonda Standard. Roxan Hockey, M.D.DATE OF BIRTH:  [birth year]  DATE OF PROCEDURE:  11/04/2017 DATE OF DISCHARGE:                              OPERATIVE REPORT   PREOPERATIVE DIAGNOSIS:  Mediastinal fluid collection.  POSTOPERATIVE DIAGNOSIS:  Mediastinal abscess.  PROCEDURE:  Left video-assisted thoracoscopy for drainage of mediastinal abscess.  SURGEON:  Revonda Standard. Roxan Hockey, MD.  ASSISTANT:  Jadene Pierini, PA-C.  ANESTHESIA:  General.  FINDINGS:  Moderate sized, murky, amber color pleural effusion. Thick-walled mediastinal abscess with frank pus.  CLINICAL NOTE:  Danielle [name] is a 71 year old woman who had undergone repair of a paraesophageal hernia on September 08, 2017.  She had a reexploration for revision of a gastrostomy tube on October 5th.  She had a prolonged hospitalization complicated by fungemia, DVT, and moderate protein-calorie malnutrition.  She was discharged on November 1st, readmitted a week later with intermittent fevers and difficulty swallowing.  Chest CT showed a fluid collection in the mediastinum and a small left effusion.  Thoracentesis showed an exudative effusion.  She continued to have fevers and I was consulted to consider drainage of the mediastinal fluid collection/possible abscess.  The indications, risks, benefits, and alternatives were discussed in detail with the patient. She understood and accepted the risks and agreed to proceed.  OPERATIVE NOTE:  Danielle [name] was brought to the preoperative holding area on October 25, 2017.  Anesthesia established intravenous access and placed an arterial blood pressure monitoring line.  She was taken to the operating room, anesthetized, and intubated with a double-lumen endotracheal tube. She was already receiving scheduled intravenous  antibiotics. She was given a dose of vancomycin as well.  Sequential compression devices were placed on the calves for DVT prophylaxis.  A Foley catheter was placed.  She was placed in a right lateral decubitus position and the left chest was prepped and draped in the usual sterile fashion.  An incision made in the seventh interspace in the anterior axillary line. A 5-mm port was inserted into the chest.  The thoracoscope was advanced into the chest.  There was good isolation of the left lung, although it was relatively slow to deflate.  A working incision was made in the fifth interspace anterolaterally.  There was a moderate pleural effusion.  This was amber in color and slightly murky, but was not purulent.  The inferior ligament was foreshortened.  The abscess could not be accessed anteriorly.  The lung was retracted anteriorly and exposed a large fluctuant mass between the aorta and the inferior pulmonary ligament.  This was incised with electrocautery and it was thick walled and frank pus was evacuated.  This was sent for bacterial, AFB, and fungal cultures.  The abscess cavity was opened as much as it was practical due to the surrounding vascular structures.  The tissue was friable and did bleed easily.  The area was copiously irrigated with warm saline.  A 28-French Blake drain was advanced into the mediastinum. It was secured to skin with #1 silk suture.  A 28-French chest tube was  placed into the pleural space through a separate stab incision and also secured to the skin.  The left lung was reinflated.  The working incision was closed with a running #1 Vicryl fascial suture.  The subcutaneous tissue was not closed.  The skin was closed with staples. All sponge, needle, and instrument counts were correct at the end of the procedure.  The patient was taken from the operating room to the postanesthetic care unit extubated and in stable condition.     Revonda Standard Roxan Hockey,  M.D.     SCH/MEDQ  D:  10/25/2017  T:  10/25/2017  Job:  [id number]

## 2017-10-25 NOTE — Progress Notes (Signed)
PROGRESS NOTE    Danielle [name]  BDZ:[id number] DOB: Apr 14, 1946 DOA: 10/15/2017 PCP: Iona Beard, MD  Brief Narrative:71 y.o.F w/ a Hxof Crohn's disease, hiatal hernia,psoriasis, and SBO who underwent a type IV hiatal hernia repair in October 2018.Postoperatively the patient required endotracheal intubation, mechanical ventilation and the use of pressors. She was extubated on postop day #2. She improved clinically and was on parenteral nutrition, however she developed a pelvic fluid collection which required a drain. On 09/11/2017 she had blood cultures positive for Candida glabrata, and the pelvic abscess culture was positive for C. Albicans and C. Glabrata. She was given treatment withparenteral antifungals. She subsequently improved, was able to ambulate with physical therapy, weaned off of parenteral nutrition and was tolerating oral diet. ID recommended to continue Invanz andErasisfor 2 weeks. A PICC line was placed in her right arm and she was ultimately discharged home.  She returned on 10/01/2017 due to intermittent fevers. She was treated empirically and clinically improved to the point that she was able to be d/c home again on 11/1.  She returned to the ED 11/8 with complaints of persistent nausea associated with emesis.   Significant Events: 11/8 readmit 11/11 PICC placed 11/12 thoracentesis 11/5 to OR at [facility] for L VATS for mediastinal drainage     Assessment & Plan:   Principal Problem:   Septic shock ([place]) Active Problems:   Leukocytosis   Perianal Crohn's disease, with fistula ([place])   Acute renal failure superimposed on stage 3 chronic kidney disease ([place])   Incarcerated hiatal hernia s/p reduction 09/08/2017   Immunosuppressed status (HCC)   Hypotension   Nausea and vomiting   S/P thoracentesis   Anasarca   DVT (deep venous thrombosis) (HCC)   Protein-calorie malnutrition, moderate (HCC)   Mediastinal abscess (HCC)   Mediastinal post-op seromas  Pleural effusion, left   Patient's noncompliance with intervention / medical advice    Dehydration   Acute embolism and thrombosis of left axillary vein (HCC)  SevereSepsis/Mediastinal abscess -CT with reaccumulation of fluid collection in the lower mediastinum. Esophagram showing 2.5 cm area of extrinsic compression upon the esophagus. Possible smaller abscesses posterior margin spleen  -11/12 S/P LEFTThoracentesis: 780 mL fluid removed: Exudative, Gram stain with WBCs and PMNs -Per Dr. Windy Canny note from 11/9 he reviewed CT scan abdomen pelvis discussed with Dr. Johnny Bridge agree mediastinal abscess complex not amenable to endoscopic drainage. Recommended cardiothoracic surgery be consulted. -IR consulted: Thoracentesis completed. Per review of EMR not candidate for percutaneous drain placement,If drain felt necessary consider tertiary care center. -transferred to [facility] to OR for VATS drainage on 11/15 - immediate post-op care per TCTS, but pt to remain on [place]  - S/P VATS drainage on 11/15. Discussed with and management per TCTS team. ID recommendscontinuing antibiotics and monitor cultures.   Hematemesis/hemoptysis on IV heparin lateral upper extremity DVT 09/2017.  It appears  that she had a PICC line in her upper extremity.  Her of the upper extremities shows a positive DVT in the left radial vein at this time.  Patient had another episode of hematemesis overnight and dropped her hemoglobin from 11-9.  Heparin drip was stopped.  I have attempted to call her GI doctor Dr. Laural Golden left 2 messages and waiting for the response.  I have increased her Protonix to twice a day.  Leukocytosis continue Zosyn follow-up chest x-ray.  It appears that patient did spike a temp overnight.  Will obtain 2 sets of blood culture.  If she has persistent  leukocytosis will consider getting a CT of the chest to see if there is any fluid collection.  Patient currently on Zosyn and Eraxis.  Patient has  history of Candida candidemia with Candida glabrata.  Currently she is also on TPN which makes her more prone to fungemia.  Hypertension blood pressure low to low normal.  Monitor. Nutrition started on TPN Anemia due to chronic illness monitor levels Chronic diastolic CHF stable     DVT prophylaxis Code Status:Family Communication:  Disposition Plan Consultants:   Procedures:  Antimicrobials:  Subjective:  Objective: Vitals:   10/25/17 0331 10/25/17 0406 10/25/17 0738 10/25/17 0830  BP: 135/65     Pulse: 89     Resp: 15 (!) 25  (!) 23  Temp: 98.3 F (36.8 C)  98 F (36.7 C)   TempSrc: Oral  Axillary   SpO2: 98% 98% 99% 100%  Weight:      Height:        Intake/Output Summary (Last 24 hours) at 10/25/2017 1020 Last data filed at 10/25/2017 0830 Gross per 24 hour  Intake 2555.84 ml  Output 640 ml  Net 1915.84 ml   Filed Weights   10/15/17 1449 10/20/17 1600 10/28/2017 0500  Weight: 55.9 kg (123 lb 3.8 oz) 55.3 kg (121 lb 14.6 oz) 55.8 kg (123 lb 0.3 oz)    Examination:  General exam: Appears calm and comfortable  Respiratory system: Clear to auscultation. Respiratory effort normal. Cardiovascular system: S1 & S2 heard, RRR. No JVD, murmurs, rubs, gallops or clicks. No pedal edema.CT drainage in place. Gastrointestinal system: Abdomen is nondistended, soft and nontender. No organomegaly or masses felt. Normal bowel sounds heard. Central nervous system: Alert and oriented. No focal neurological deficits. Extremities: Symmetric 5 x 5 power. Skin: No rashes, lesions or ulcers Psychiatry: Judgement and insight appear normal. Mood & affect appropriate.     Data Reviewed: I have personally reviewed following labs and imaging studies  CBC: Recent Labs  Lab 10/17/2017 0321 10/22/17 0400 10/23/17 0513 10/24/17 0006 10/25/17 0005  WBC 25.6* 26.8* 27.3* 24.5* 24.9*  NEUTROABS  --   --   --   --  18.2*  HGB 8.6* 11.1* 11.3* 11.3* 9.6*  HCT 26.0* 34.0* 35.7*  34.5* 29.5*  MCV 85.5 83.5 84.2 84.1 84.0  PLT 328 295 327 300 836   Basic Metabolic Panel: Recent Labs  Lab 10/20/17 0418 10/24/2017 0321 10/22/17 0400 10/23/17 0513 10/24/17 0006 10/25/17 0005  NA 135 134* 135 135 135 135  K 3.4* 3.4* 3.6 3.9 4.2 3.5  CL 112* 109 111 109 109 109  CO2 20* 19* 18* 18* 18* 20*  GLUCOSE 109* 119* 205* 85 91 124*  BUN 10 11 13 16 16 15   CREATININE 0.85 0.87 0.77 0.80 0.88 0.84  CALCIUM 7.6* 7.5* 8.0* 7.7* 7.9* 7.7*  MG 2.4 1.8 1.6* 2.1 2.0 1.9  PHOS 3.1 2.3* 3.0 3.2  --  3.4   GFR: Estimated Creatinine Clearance: 55.7 mL/min (by C-G formula based on SCr of 0.84 mg/dL). Liver Function Tests: Recent Labs  Lab 10/19/17 0500 10/20/17 0418 11/02/2017 0321 10/23/17 0513 10/25/17 0005  AST 45* 117* 60* 28 32  ALT 20 50 37 27 28  ALKPHOS 77 109 112 111 145*  BILITOT 0.8 0.5 0.8 0.4 0.3  PROT 5.7* 6.1* 6.0* 6.9 6.0*  ALBUMIN 1.6* 1.6* 1.5* 1.6* 1.5*   No results for input(s): LIPASE, AMYLASE in the last 168 hours. No results for input(s): AMMONIA in the last 168  hours. Coagulation Profile: No results for input(s): INR, PROTIME in the last 168 hours. Cardiac Enzymes: No results for input(s): CKTOTAL, CKMB, CKMBINDEX, TROPONINI in the last 168 hours. BNP (last 3 results) No results for input(s): PROBNP in the last 8760 hours. HbA1C: No results for input(s): HGBA1C in the last 72 hours. CBG: Recent Labs  Lab 10/20/17 2211 10/31/2017 1649 10/24/17 1433 10/24/17 1942 10/25/17 0544  GLUCAP 93 163* 84 93 120*   Lipid Profile: Recent Labs    10/25/17 0333  TRIG 83   Thyroid Function Tests: No results for input(s): TSH, T4TOTAL, FREET4, T3FREE, THYROIDAB in the last 72 hours. Anemia Panel: No results for input(s): VITAMINB12, FOLATE, FERRITIN, TIBC, IRON, RETICCTPCT in the last 72 hours. Sepsis Labs: No results for input(s): PROCALCITON, LATICACIDVEN in the last 168 hours.  Recent Results (from the past 240 hour(s))  C difficile quick  scan w PCR reflex     Status: None   Collection Time: 10/16/17  2:47 PM  Result Value Ref Range Status   C Diff antigen NEGATIVE NEGATIVE Final   C Diff toxin NEGATIVE NEGATIVE Final   C Diff interpretation No C. difficile detected.  Final  Gastrointestinal Panel by PCR , Stool     Status: None   Collection Time: 10/16/17  2:48 PM  Result Value Ref Range Status   Campylobacter species NOT DETECTED NOT DETECTED Final   Plesimonas shigelloides NOT DETECTED NOT DETECTED Final   Salmonella species NOT DETECTED NOT DETECTED Final   Yersinia enterocolitica NOT DETECTED NOT DETECTED Final   Vibrio species NOT DETECTED NOT DETECTED Final   Vibrio cholerae NOT DETECTED NOT DETECTED Final   Enteroaggregative E coli (EAEC) NOT DETECTED NOT DETECTED Final   Enteropathogenic E coli (EPEC) NOT DETECTED NOT DETECTED Final   Enterotoxigenic E coli (ETEC) NOT DETECTED NOT DETECTED Final   Shiga like toxin producing E coli (STEC) NOT DETECTED NOT DETECTED Final   Shigella/Enteroinvasive E coli (EIEC) NOT DETECTED NOT DETECTED Final   Cryptosporidium NOT DETECTED NOT DETECTED Final   Cyclospora cayetanensis NOT DETECTED NOT DETECTED Final   Entamoeba histolytica NOT DETECTED NOT DETECTED Final   Giardia lamblia NOT DETECTED NOT DETECTED Final   Adenovirus F40/41 NOT DETECTED NOT DETECTED Final   Astrovirus NOT DETECTED NOT DETECTED Final   Norovirus GI/GII NOT DETECTED NOT DETECTED Final   Rotavirus A NOT DETECTED NOT DETECTED Final   Sapovirus (I, II, IV, and V) NOT DETECTED NOT DETECTED Final  Culture, fungus without smear     Status: None (Preliminary result)   Collection Time: 10/18/17  3:19 PM  Result Value Ref Range Status   Specimen Description Pleural, L  Final   Special Requests NONE  Final   Culture   Final    NO FUNGUS ISOLATED AFTER 3 DAYS Performed at [facility], [address] [place], [place] [address]    Report Status PENDING  Incomplete  Culture, body fluid-bottle      Status: None   Collection Time: 10/18/17  3:19 PM  Result Value Ref Range Status   Specimen Description FLUID LEFT PLEURAL  Final   Special Requests BOTTLES DRAWN AEROBIC AND ANAEROBIC 10CC  Final   Culture   Final    NO GROWTH 5 DAYS Performed at [facility], [place] [address], [place], [place] [address]    Report Status 10/23/2017 FINAL  Final  Gram stain     Status: None   Collection Time: 10/18/17  3:19  PM  Result Value Ref Range Status   Specimen Description FLUID LEFT PLEURAL  Final   Special Requests NONE  Final   Gram Stain   Final    ABUNDANT WBC PRESENT,BOTH PMN AND MONONUCLEAR NO ORGANISMS SEEN Performed at [facility], [address], [place], [place] [address]    Report Status 10/18/2017 FINAL  Final  Culture, blood (routine x 2)     Status: None   Collection Time: 10/19/17  9:45 AM  Result Value Ref Range Status   Specimen Description BLOOD BLOOD RIGHT FOREARM  Final   Special Requests IN PEDIATRIC BOTTLE Blood Culture adequate volume  Final   Culture   Final    NO GROWTH 5 DAYS Performed at [facility], [place] [address], [place], [place] [address]    Report Status 10/24/2017 FINAL  Final  Culture, blood (routine x 2)     Status: None   Collection Time: 10/19/17  9:48 AM  Result Value Ref Range Status   Specimen Description BLOOD RIGHT HAND  Final   Special Requests IN PEDIATRIC BOTTLE Blood Culture adequate volume  Final   Culture   Final    NO GROWTH 5 DAYS Performed at [facility], [place] [address] [place], [place] [address]    Report Status 10/24/2017 FINAL  Final  Culture, Urine     Status: None   Collection Time: 10/20/17  5:06 PM  Result Value Ref Range Status   Specimen Description URINE, CATHETERIZED  Final   Special Requests NONE  Final   Culture   Final    NO GROWTH Performed at [facility], [place] [address], [place],  [address]    Report Status 10/22/2017 FINAL  Final  Surgical PCR screen     Status: None     Collection Time: 10/13/2017  3:21 AM  Result Value Ref Range Status   MRSA, PCR NEGATIVE NEGATIVE Final   Staphylococcus aureus NEGATIVE NEGATIVE Final    Comment: (NOTE) The Xpert SA Assay (FDA approved for NASAL specimens in patients 46 years of age and older), is one component of a comprehensive surveillance program. It is not intended to diagnose infection nor to guide or monitor treatment.   Body fluid culture     Status: None   Collection Time: 10/10/2017  9:02 AM  Result Value Ref Range Status   Specimen Description FLUID  Final   Special Requests MEDISTINAL PATIENT ON FOLLOWING ZOSYN AND VANC  Final   Gram Stain   Final    MODERATE WBC PRESENT,BOTH PMN AND MONONUCLEAR NO ORGANISMS SEEN    Culture NO GROWTH 3 DAYS  Final   Report Status 10/24/2017 FINAL  Final  Body fluid culture     Status: None   Collection Time: 10/26/2017  9:02 AM  Result Value Ref Range Status   Specimen Description FLUID PLEURAL LEFT  Final   Special Requests PATIENT ON FOLLOWING ZOSYN AND VANC  Final   Gram Stain   Final    MODERATE WBC PRESENT, PREDOMINANTLY MONONUCLEAR NO ORGANISMS SEEN    Culture NO GROWTH 3 DAYS  Final   Report Status 10/24/2017 FINAL  Final  Acid Fast Smear (AFB)     Status: None   Collection Time: 10/10/2017  9:02 AM  Result Value Ref Range Status   AFB Specimen Processing Concentration  Final   Acid Fast Smear Negative  Final    Comment: (NOTE) Performed At: [facility] [address]  [place], [place] [id number] Rush Farmer MD [id number]    Source (AFB) FLUID  Final    Comment: PLEURAL LEFT PATIENT ON FOLLOWING ZOSYN AND VANC   Acid Fast Smear (AFB)     Status: None   Collection Time: 10/22/2017  9:42 AM  Result Value Ref Range Status   AFB Specimen Processing Concentration  Final   Acid Fast Smear Negative  Final    Comment: (NOTE) Performed At: [facility] [place], [place] [id number] Rush Farmer MD BO:[id number]     Source (AFB) FLUID  Final    Comment: MEDISTINAL PATIENT ON FOLLOWING ZOSYN AND VANC   Fungus culture, blood     Status: None (Preliminary result)   Collection Time: 10/24/17 12:16 AM  Result Value Ref Range Status   Specimen Description BLOOD LEFT FOOT  Final   Special Requests   Final    BOTTLES DRAWN AEROBIC AND ANAEROBIC Blood Culture adequate volume   Culture PENDING  Incomplete   Report Status PENDING  Incomplete         Radiology Studies: Dg Chest 2 View  Result Date: 10/24/2017 CLINICAL DATA:  Hemoptysis EXAM: CHEST  2 VIEW COMPARISON:  Chest x-rays dated 10/24/2016 and 10/22/2017. FINDINGS: Heart size and mediastinal contours are stable. Lines and tubes are stable in the short-term interval. Bibasilar opacities are stable. Probable small left pleural effusion. Hiatal hernia better demonstrated on earlier exams. IMPRESSION: 1. No interval change. Patchy bibasilar opacities are stable, atelectasis versus aspiration. Probable small left pleural effusion. 2. Lines and tubes are stable in position. Electronically Signed   By: Franki Cabot M.D.   On: 10/24/2017 16:36   Dg Chest Port 1 View  Result Date: 10/24/2017 CLINICAL DATA:  Acute onset of fever, vomiting and cough. Concern for aspiration. Patient has left-sided chest tubes. EXAM: PORTABLE CHEST 1 VIEW COMPARISON:  Chest radiograph performed 10/22/2017 FINDINGS: Bilateral mid and lower lung zone airspace opacities appear mildly worsened and could reflect aspiration pneumonia, given clinical concern. Small bilateral pleural effusions are suspected. No pneumothorax is seen, though the lung apices are obscured by the patient's head. Left apical and left basilar chest tubes are unchanged in appearance. The cardiomediastinal silhouette is mildly enlarged. No acute osseous abnormalities are seen. A large hiatal hernia is noted. IMPRESSION: 1. Bilateral mid and lower lung zone airspace opacities appear mildly worsened and could reflect  aspiration pneumonia, given clinical concern. 2. Suspect small bilateral pleural effusions. 3. Mild cardiomegaly. 4. Large hiatal hernia. Electronically Signed   By: Garald Balding M.D.   On: 10/24/2017 00:36   Dg Abd Portable 1v  Result Date: 10/24/2017 CLINICAL DATA:  Acute onset of fever, vomiting and cough. EXAM: PORTABLE ABDOMEN - 1 VIEW COMPARISON:  CT of the abdomen and pelvis performed 11/04/2017 FINDINGS: The visualized bowel gas pattern is unremarkable. Scattered air and stool filled loops of colon are seen; no abnormal dilatation of small bowel loops is seen to suggest small bowel obstruction. No free intra-abdominal air is identified, though evaluation for free air is limited on a single supine view. Postoperative change is noted about the right side of the abdomen and pelvis. The visualized osseous structures are within normal limits; the sacroiliac joints are unremarkable in appearance. IMPRESSION: Unremarkable bowel gas pattern; no free intra-abdominal air seen. Small amount of stool noted in the colon. Electronically Signed   By: Garald Balding M.D.   On: 10/24/2017 00:37        Scheduled Meds: .  acetaminophen  1,000 mg Oral Q6H   Or  . acetaminophen (TYLENOL) oral liquid 160 mg/5 mL  1,000 mg Oral Q6H  . bisacodyl  10 mg Oral Daily  . fentaNYL   Intravenous Q4H  . insulin aspart  0-3 Units Subcutaneous 3 times per day  . lip balm  1 application Topical BID  . pantoprazole (PROTONIX) IV  40 mg Intravenous Q12H  . senna-docusate  1 tablet Oral QHS   Continuous Infusions: . anidulafungin Stopped (10/24/17 2104)  . piperacillin-tazobactam (ZOSYN)  IV Stopped (10/25/17 0916)  . potassium chloride Stopped (10/22/17 1145)  . TPN CYCLIC-ADULT (ION) 99 mL/hr at 10/24/17 2044  . TPN CYCLIC-ADULT (ION)       LOS: 11 days      Georgette Shell, MD [facility]  If 7PM-7AM, please contact night-coverage [email or web address] Password [email or web address] 10/25/2017, 10:20 AM

## 2017-10-25 NOTE — Progress Notes (Signed)
Subjective No acute events. Feeling well, resting comfortably.  Objective: Vital signs in last 24 hours: Temp:  [98 F (36.7 C)-100.6 F (38.1 C)] 98 F (36.7 C) (11/19 0738) Pulse Rate:  [89-104] 89 (11/19 0331) Resp:  [15-30] 25 (11/19 0406) BP: (127-148)/(59-75) 135/65 (11/19 0331) SpO2:  [96 %-100 %] 99 % (11/19 0738) Last BM Date: 10/24/17  Intake/Output from previous day: 11/18 0701 - 11/19 0700 In: 2555.8 [I.V.:2045.8; IV Piggyback:510] Out: 670 [Urine:300; Stool:200; Chest Tube:170] Intake/Output this shift: No intake/output data recorded.  Gen: NAD, comfortable CV: RRR Pulm: Normal work of breathing; coarse BS bilaterally; no air leak from CT Abd: Soft, NT/ND Ext: SCDs in place  UOP: adequate Mediastinal drain: 260cc/ss CT: 130cc/ss  Lab Results: CBC  Recent Labs    10/24/17 0006 10/25/17 0005  WBC 24.5* 24.9*  HGB 11.3* 9.6*  HCT 34.5* 29.5*  PLT 300 274   BMET Recent Labs    10/24/17 0006 10/25/17 0005  NA 135 135  K 4.2 3.5  CL 109 109  CO2 18* 20*  GLUCOSE 91 124*  BUN 16 15  CREATININE 0.88 0.84  CALCIUM 7.9* 7.7*   CXR 11/18: 1. No interval change. Patchy bibasilar opacities are stable, atelectasis versus aspiration. Probable small left pleural effusion. 2. Lines and tubes are stable in position.  ECHO 11/18 - LVEF 55-60% with hypokinesis of the basal and mid inferior and   inferolateral walls.  Assessment/Plan: Patient Active Problem List   Diagnosis Date Noted  . Dehydration   . Acute embolism and thrombosis of left axillary vein ([place])   . Septic shock ([place]) 10/17/2017  . Patient's noncompliance with intervention / medical advice  10/17/2017  . 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 Pleural effusion, left 10/16/2017  . Mediastinal abscess ([place]) 11/02/2017  . DVT (deep venous thrombosis) ([place])   . Metabolic acidosis   . Thrombocytosis ([place])   . Protein-calorie malnutrition, moderate ([place])   . Normochromic normocytic  anemia   . SIRS (systemic inflammatory response syndrome) ([place]) 10/01/2017  . Pressure injury of skin 09/25/2017  . Hypoglycemia 09/25/2017  . Electrolyte imbalance 09/15/2017  . Anasarca 09/13/2017  . S/P thoracentesis 09/11/2017  . Peritonitis ([place]) 09/11/2017  . Fungemia 09/11/2017  . Gastrostomy tube in place ([place]) 09/08/2017  . Protein-calorie malnutrition, severe 06/10/2017  . Nausea and vomiting 06/08/2017  . 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 Hypotension 05/07/2017  . UTI (urinary tract infection) 05/07/2017  . Crohn disease ([place]) 02/19/2017  . Immunosuppressed status ([place]) 12/09/2015  . Incarcerated hiatal hernia s/p reduction 09/08/2017 12/07/2015  . Acute renal failure superimposed on stage 3 chronic kidney disease ([place]) 12/06/2015  . Perianal Crohn's disease, with fistula ([place])   . Hypomagnesemia   . Leukocytosis 11/06/2011   s/p Procedure(s): LEFT VIDEO ASSISTED THORACOSCOPY (VATS) for MEDISTINAL DRAINAGE 10/22/2017  -Doing well -CTS managing CTs  -Continue TPN -Mobilize as able -PPx: Hep gtt for DVT; mechanical dvt ppx; IV PPI  LOS: 11 days   Sharon Mt. Dema Severin, M.D. [facility]

## 2017-10-25 NOTE — Progress Notes (Signed)
Md called for Iv tylenol order to replace po at 0600.  Pt requesting Iv med instead of the PO.  Pt does not feel like she could keep PO medication down.  Will continue to monitor [facility] T +

## 2017-10-26 LAB — BASIC METABOLIC PANEL
Anion gap: 8 (ref 5–15)
BUN: 16 mg/dL (ref 6–20)
CHLORIDE: 106 mmol/L (ref 101–111)
CO2: 21 mmol/L — AB (ref 22–32)
CREATININE: 0.86 mg/dL (ref 0.44–1.00)
Calcium: 7.9 mg/dL — ABNORMAL LOW (ref 8.9–10.3)
GFR calc non Af Amer: >60 mL/min (ref >60)
Glucose, Bld: 114 mg/dL — ABNORMAL HIGH (ref 65–99)
POTASSIUM: 3.8 mmol/L (ref 3.5–5.1)
Sodium: 135 mmol/L (ref 135–145)

## 2017-10-26 LAB — CBC WITH DIFFERENTIAL/PLATELET
Basophils Absolute: 0.1 10*3/uL (ref 0.0–0.1)
Basophils Relative: 0 %
EOS ABS: 0.2 10*3/uL (ref 0.0–0.7)
Eosinophils Relative: 1 %
HEMATOCRIT: 28.4 % — AB (ref 36.0–46.0)
Hemoglobin: 9.2 g/dL — ABNORMAL LOW (ref 12.0–15.0)
LYMPHS ABS: 4.1 10*3/uL — AB (ref 0.7–4.0)
LYMPHS PCT: 17 %
MCH: 27.3 pg (ref 26.0–34.0)
MCHC: 32.4 g/dL (ref 30.0–36.0)
MCV: 84.3 fL (ref 78.0–100.0)
MONOS PCT: 11 %
Monocytes Absolute: 2.5 10*3/uL — ABNORMAL HIGH (ref 0.1–1.0)
NEUTROS PCT: 71 %
Neutro Abs: 17 10*3/uL — ABNORMAL HIGH (ref 1.7–7.7)
Platelets: 253 10*3/uL (ref 150–400)
RBC: 3.37 MIL/uL — ABNORMAL LOW (ref 3.87–5.11)
RDW: 19.7 % — ABNORMAL HIGH (ref 11.5–15.5)
WBC: 23.9 10*3/uL — ABNORMAL HIGH (ref 4.0–10.5)

## 2017-10-26 LAB — GLUCOSE, CAPILLARY
GLUCOSE-CAPILLARY: 55 mg/dL — AB (ref 65–99)
GLUCOSE-CAPILLARY: 64 mg/dL — AB (ref 65–99)
Glucose-Capillary: 107 mg/dL — ABNORMAL HIGH (ref 65–99)

## 2017-10-26 LAB — LACTIC ACID, PLASMA: Lactic Acid, Venous: 1 mmol/L (ref 0.5–1.9)

## 2017-10-26 MED ORDER — ACETAMINOPHEN 160 MG/5ML PO SOLN
1000.0000 mg | Freq: Four times a day (QID) | ORAL | Status: AC
  Filled 2017-10-26: qty 40.6

## 2017-10-26 MED ORDER — ACETAMINOPHEN 10 MG/ML IV SOLN
1000.0000 mg | Freq: Once | INTRAVENOUS | Status: AC
  Administered 2017-10-27: 1000 mg via INTRAVENOUS
  Filled 2017-10-26: qty 100

## 2017-10-26 MED ORDER — IOPAMIDOL (ISOVUE-300) INJECTION 61%
INTRAVENOUS | Status: AC
  Filled 2017-10-26: qty 75

## 2017-10-26 MED ORDER — TRAVASOL 10 % IV SOLN
INTRAVENOUS | Status: AC
  Administered 2017-10-26: 18:00:00 via INTRAVENOUS
  Filled 2017-10-26 (×2): qty 840

## 2017-10-26 MED ORDER — PIPERACILLIN-TAZOBACTAM 3.375 G IVPB
3.3750 g | Freq: Three times a day (TID) | INTRAVENOUS | Status: DC
  Administered 2017-10-27 (×2): 3.375 g via INTRAVENOUS
  Filled 2017-10-26 (×3): qty 50

## 2017-10-26 MED ORDER — ACETAMINOPHEN 500 MG PO TABS
1000.0000 mg | ORAL_TABLET | Freq: Four times a day (QID) | ORAL | Status: AC
  Administered 2017-10-27: 1000 mg via ORAL
  Filled 2017-10-26 (×2): qty 2

## 2017-10-26 NOTE — Progress Notes (Signed)
[facility] for [facility]    Date of Admission:  10/29/2017   Total days of antibiotics 11        Day 11 anidula            Day 11 piptazo           ID: Danielle [name] is a 71 y.o. female with type IV hiatal hernia repair on 93/8 complicated by fungemia, pelvic abscess as well as mediastinal abscess s/p VATS on 11/15, currently maintained on TPN Principal Problem:   Septic shock ([place]) Active Problems:   Leukocytosis   Perianal Crohn's disease, with fistula ([place])   Acute renal failure superimposed on stage 3 chronic kidney disease ([place])   Incarcerated hiatal hernia s/p reduction 09/08/2017   Immunosuppressed status (HCC)   Hypotension   Nausea and vomiting   S/P thoracentesis   Anasarca   DVT (deep venous thrombosis) ([place])   Protein-calorie malnutrition, moderate (HCC)   Mediastinal abscess (HCC)   Mediastinal post-op seromas   Pleural effusion, left   Patient's noncompliance with intervention / medical advice    Dehydration   Acute embolism and thrombosis of left axillary vein (HCC)    Subjective: Underwent repeat chest CT yesterday that had evidence of loculated fluid near gastric cardia as well as fluid collection near spleen High isolated fever of 102F last night, feeling nauseated this morning     Medications:  . acetaminophen  1,000 mg Oral Q6H   Or  . acetaminophen (TYLENOL) oral liquid 160 mg/5 mL  1,000 mg Oral Q6H  . bisacodyl  10 mg Oral Daily  . fentaNYL   Intravenous Q4H  . insulin aspart  0-3 Units Subcutaneous 3 times per day  . iopamidol      . lip balm  1 application Topical BID  . [START ON 10/29/2017] pantoprazole  40 mg Intravenous Q12H  . senna-docusate  1 tablet Oral QHS    Objective: Vital signs in last 24 hours: Temp:  [97.9 F (36.6 C)-103 F (39.4 C)] 99.6 F (37.6 C) (11/20 1214) Pulse Rate:  [86-118] 97 (11/20 1214) Resp:  [17-36] 28 (11/20 1214) BP: (111-185)/(57-91) 185/91 (11/20 1214) SpO2:  [95 %-100 %] 98 %  (11/20 0800) Constitutional:  oriented to person, place, and time. Appears chronically ill. No distress.  HENT: [place]/AT, PERRLA, no scleral icterus Mouth/Throat: Oropharynx is clear and moist. No oropharyngeal exudate.  Cardiovascular: tachy, regular rhythm and normal heart sounds. Exam reveals no gallop and no friction rub.  Pulmonary/Chest: Effort normal and breath sounds normal, except decreased on the left. has no wheezes. 2 chest tubes from VATS are in place, serosanginous drainage- decreased output Abdominal: Soft. Bowel sounds are decrease.  exhibits no distension. There is mild epigastric tenderness. Dressing in epigastric region, rectal tube in place Neurological: alert and oriented to person, place, and time.  Skin: Skin is warm and dry. No rash noted. No erythema.  Psychiatric: a normal mood and affect.  behavior is normal.    Lab Results Recent Labs    10/25/17 0005 10/25/17 2045 10/26/17 0323  WBC 24.9* 31.8* 23.9*  HGB 9.6* 10.5* 9.2*  HCT 29.5* 32.0* 28.4*  NA 135  --  135  K 3.5  --  3.8  CL 109  --  106  CO2 20*  --  21*  BUN 15  --  16  CREATININE 0.84  --  0.86   Liver Panel Recent Labs    10/25/17 0005  PROT  6.0*  ALBUMIN 1.5*  AST 32  ALT 28  ALKPHOS 145*  BILITOT 0.3   Sedimentation Rate Recent Labs    10/24/17 0006  ESRSEDRATE 65*    Microbiology: 11/15 pleural fluid NGTD 11/18 blood cx NGTD Studies/Results: Dg Chest 2 View  Result Date: 10/24/2017 CLINICAL DATA:  Hemoptysis EXAM: CHEST  2 VIEW COMPARISON:  Chest x-rays dated 10/24/2016 and 10/22/2017. FINDINGS: Heart size and mediastinal contours are stable. Lines and tubes are stable in the short-term interval. Bibasilar opacities are stable. Probable small left pleural effusion. Hiatal hernia better demonstrated on earlier exams. IMPRESSION: 1. No interval change. Patchy bibasilar opacities are stable, atelectasis versus aspiration. Probable small left pleural effusion. 2. Lines and tubes  are stable in position. Electronically Signed   By: Franki Cabot M.D.   On: 10/24/2017 16:36   Ct Chest W Contrast  Result Date: 10/25/2017 CLINICAL DATA:  71 y/o F; history of paraesophageal hernia repair 09/08/2017 and mediastinal abscess post vats and drainage 10/07/2017. EXAM: CT CHEST WITH CONTRAST TECHNIQUE: Multidetector CT imaging of the chest was performed during intravenous contrast administration. CONTRAST:  72 cc Isovue-300 COMPARISON:  10/05/2017 CT of the chest. FINDINGS: Cardiovascular: Normal heart size. No pericardial effusion. Left subclavian line extends to the upper SVC. The left PICC line tip extends into the azygos vein. Normal caliber thoracic aorta. Normal caliber main pulmonary artery. Mediastinum/Nodes: Patulous and fluid-filled esophagus as well as large hiatal hernia. The lower esophagus is thick-walled near the gastroesophageal junction likely representing reactive inflammatory changes. Within the posterior inferior mediastinum in the region of the surgical drain to the left and posterior of the herniated gastric cardia and anterior to the descending thoracic aorta there is an ill-defined multiloculated extraluminal fluid collection spanning approximately in conglomerate 3.8 x 2.7 x 3.9 cm likely representing the drained abscess(AP x ML x CC series 3, image 72 and series 5, image 73). Additionally, to the right and posterior of the herniated gastric cardia there is a discrete rim enhancing fluid collection measuring 14 mm likely representing a separate abscess loculation (series 3, image 82). Multiple stable thyroid nodules are present measuring up to 18 mm in the right lobe of the thyroid (series 5, image 33). Lungs/Pleura: Small left and moderate right pleural effusions are present. There is a left-sided chest tube and second left-sided drain traversing the left pleural space into the lower posterior mediastinum. No pneumothorax. Consolidations are present within the dependent  lower lobes bilaterally. Upper Abdomen: Multiloculated rim enhancing fluid collections are present under the diaphragm surrounding the spleen and extending into the left pericolic gutter the representing additional abscesses. Musculoskeletal: Multiple chronic bilateral rib fractures are present. No acute osseous abnormality is identified. IMPRESSION: 1. Ill-defined multiloculated rim enhancing fluid collection and extensive edema in posterior mediastinum inferior to mediastinal drain and anterior to aorta, probably representing the residual of drained abscess. Discrete 14 mm abscess present in the right posterior mediastinum. 2. Multiloculated rim enhancing fluid collection surrounding the spleen and extending into left pericolic gutter, likely abscess. 3. Small bilateral pleural effusions with left pleural drain. 4. Stable dependent lower lobe consolidations may represent atelectasis or pneumonia. Electronically Signed   By: Kristine Garbe M.D.   On: 10/25/2017 22:41     Assessment/Plan: Polymicrobial intra-thoracic and intra-abdominal infection = continue with boadspectrum abtx including antifungal coverage for known c.glabrata infection. Phlegmon in areas will take time to treat with abtx alone, but they have drains in good position per dr hendrickson's review. Recommend to continue  with iv abtx. In regards to her rim enhancing fluid collection about spleen, maybe helpful to get CT to see if any areas that can be drained in pericolic gutters- though I suspect it would be difficult to access and we are reliant on long term abtx  Difficult situation given extent of infection/burden of disease/deconditioning  Agree with dr Therisa Doyne to try to transition off of parental nutrition as this places her at risk for candidal infection. Recommend to see in nutritionist can determine/ recommending oral intake needs  Intermittent fevers= likely from underlying infection. Await to see abd CT. For now continue  on broad spectrum abtx. Her wbc trended down from 31.8 to 24K today  [facility], [facility] for Infectious Diseases Cell: [phone] Pager: [phone]  10/26/2017, 1:02 PM

## 2017-10-26 NOTE — Progress Notes (Signed)
[place] NOTE   Pharmacy Consult for TPN Indication: Poor PO intake / unable to place repeat G-tube   Patient Measurements: Height: 5' 4.5" (163.8 cm) Weight: 123 lb 0.3 oz (55.8 kg) IBW/kg (Calculated) : 55.85 TPN AdjBW (KG): 59 Body mass index is 20.79 kg/m.  Assessment:  44 yoF re-admit 11/8 with N/V, loose stools (baseline w/Crohn's). Was hospitalized for a month in October and developed fungemia and treated with Invanz and anidulafungin. Re-admitted a week later with intermittent fevers and difficultly swallowing. CT chest showed recurrent mediastinal fluid abscess.  PMH: Crohns, HH, hx SBO  Significant events:  11/15 Left VATs to drain mediastinum   GI: Mediastinal abscess complex, has been advanced to full liquid diet but has nausea and vomiting with almost anything PO. Had 4 episodes of emesis in last 24 hrs but volume not charted. RN reports coffee ground emesis overnight so heparin stopped, but now restarting in the morning. Patient has poor appetite. GI recommending possible Gtube placement. Albumin low at 1.5. Last BM was 11/18. Endo: CBGs controled (70-110s) on SSI. Has not really insulin during admission.  Insulin requirements in the past 24 hours: 0 unit regular insulin  Lytes: wnl today. CO2 up to 21. CoCa 9.9. Renal: SCr stable, UOP up a little. Net positive this admit but I/Os not all charted. Pulm: RA; Bilateral chest tube output at 40 mL / 24 hours, chest tubes to remain in place for several days  Cards: BP mostly controlled, HR 80-100s Hepatobil: AST/ALT and Tbili wnl. Trig 83 ID: Continues on Zosyn and anidulafungin for mediastinal abscess. Tmax of 103 yesteday, WBC down to 23.9. Drain in place. Cultures negative so far  Best Practices: SCDs TPN Access: PICC 11/11>> TPN start date: 11/11>>  Nutritional Goals (per RD recommendation on 11/19): Kcal:  1500-1700 Protein:  75-90 grams  Fluid:  >/= 1.5 L/day  Current  Nutrition:  Full Liquid (appetite poor) Custom TPN  Plan:  Continue cyclic TPN. Infuse 1,680 mL over 16 hrs : 56 mL/hr x 1 hr, then 112 mL/hr x 14 hrs, then 56 mL/hr x 1 hr.  TPN provides 84 g of protein, 235 g of dextrose, and 50 g lipids providing 1,640 total kcal, which meets ~100% of patient needs.  Electrolytes in TPN: Max acetate AddMVI,trace elements in TPN Continue custom (sensitive) SSI for one more day and then may be able to d/c Monitor TPN labs, clinical progress  F/U ability to tolerate PO diet   Elenor Quinones, PharmD, [facility] Clinical Pharmacist Pager [phone] 10/26/2017 8:31 AM

## 2017-10-26 NOTE — Progress Notes (Signed)
5 Days Post-Op   Subjective/Chief Complaint: Pt with no acute events overnight   Objective: Vital signs in last 24 hours: Temp:  [97.9 F (36.6 C)-103 F (39.4 C)] 99.6 F (37.6 C) (11/20 1214) Pulse Rate:  [86-118] 97 (11/20 1214) Resp:  [17-36] 28 (11/20 1214) BP: (111-185)/(57-91) 185/91 (11/20 1214) SpO2:  [95 %-100 %] 98 % (11/20 0800) Last BM Date: 10/25/17  Intake/Output from previous day: 11/19 0701 - 11/20 0700 In: 1634.6 [I.V.:1484.6; IV Piggyback:150] Out: 790 [Urine:750; Chest Tube:40] Intake/Output this shift: Total I/O In: 733.8 [I.V.:503.8; IV Piggyback:230] Out: 20 [Chest Tube:20]  General appearance: alert and cooperative Resp: coarse b/l chest tubes GI: soft, non-tender; bowel sounds normal; no masses,  no organomegaly  Lab Results:  Recent Labs    10/25/17 2045 10/26/17 0323  WBC 31.8* 23.9*  HGB 10.5* 9.2*  HCT 32.0* 28.4*  PLT 291 253   BMET Recent Labs    10/25/17 0005 10/26/17 0323  NA 135 135  K 3.5 3.8  CL 109 106  CO2 20* 21*  GLUCOSE 124* 114*  BUN 15 16  CREATININE 0.84 0.86  CALCIUM 7.7* 7.9*   PT/INR No results for input(s): LABPROT, INR in the last 72 hours. ABG No results for input(s): PHART, HCO3 in the last 72 hours.  Invalid input(s): PCO2, PO2  Studies/Results: Dg Chest 2 View  Result Date: 10/24/2017 CLINICAL DATA:  Hemoptysis EXAM: CHEST  2 VIEW COMPARISON:  Chest x-rays dated 10/24/2016 and 10/22/2017. FINDINGS: Heart size and mediastinal contours are stable. Lines and tubes are stable in the short-term interval. Bibasilar opacities are stable. Probable small left pleural effusion. Hiatal hernia better demonstrated on earlier exams. IMPRESSION: 1. No interval change. Patchy bibasilar opacities are stable, atelectasis versus aspiration. Probable small left pleural effusion. 2. Lines and tubes are stable in position. Electronically Signed   By: Franki Cabot M.D.   On: 10/24/2017 16:36   Ct Chest W  Contrast  Result Date: 10/25/2017 CLINICAL DATA:  71 y/o F; history of paraesophageal hernia repair 09/08/2017 and mediastinal abscess post vats and drainage 10/24/2017. EXAM: CT CHEST WITH CONTRAST TECHNIQUE: Multidetector CT imaging of the chest was performed during intravenous contrast administration. CONTRAST:  72 cc Isovue-300 COMPARISON:  10/05/2017 CT of the chest. FINDINGS: Cardiovascular: Normal heart size. No pericardial effusion. Left subclavian line extends to the upper SVC. The left PICC line tip extends into the azygos vein. Normal caliber thoracic aorta. Normal caliber main pulmonary artery. Mediastinum/Nodes: Patulous and fluid-filled esophagus as well as large hiatal hernia. The lower esophagus is thick-walled near the gastroesophageal junction likely representing reactive inflammatory changes. Within the posterior inferior mediastinum in the region of the surgical drain to the left and posterior of the herniated gastric cardia and anterior to the descending thoracic aorta there is an ill-defined multiloculated extraluminal fluid collection spanning approximately in conglomerate 3.8 x 2.7 x 3.9 cm likely representing the drained abscess(AP x ML x CC series 3, image 72 and series 5, image 73). Additionally, to the right and posterior of the herniated gastric cardia there is a discrete rim enhancing fluid collection measuring 14 mm likely representing a separate abscess loculation (series 3, image 82). Multiple stable thyroid nodules are present measuring up to 18 mm in the right lobe of the thyroid (series 5, image 33). Lungs/Pleura: Small left and moderate right pleural effusions are present. There is a left-sided chest tube and second left-sided drain traversing the left pleural space into the lower posterior mediastinum. No pneumothorax.  Consolidations are present within the dependent lower lobes bilaterally. Upper Abdomen: Multiloculated rim enhancing fluid collections are present under the  diaphragm surrounding the spleen and extending into the left pericolic gutter the representing additional abscesses. Musculoskeletal: Multiple chronic bilateral rib fractures are present. No acute osseous abnormality is identified. IMPRESSION: 1. Ill-defined multiloculated rim enhancing fluid collection and extensive edema in posterior mediastinum inferior to mediastinal drain and anterior to aorta, probably representing the residual of drained abscess. Discrete 14 mm abscess present in the right posterior mediastinum. 2. Multiloculated rim enhancing fluid collection surrounding the spleen and extending into left pericolic gutter, likely abscess. 3. Small bilateral pleural effusions with left pleural drain. 4. Stable dependent lower lobe consolidations may represent atelectasis or pneumonia. Electronically Signed   By: Kristine Garbe M.D.   On: 10/25/2017 22:41    Anti-infectives: Anti-infectives (From admission, onward)   Start     Dose/Rate Route Frequency Ordered Stop   10/22/17 0600  vancomycin (VANCOCIN) IVPB 1000 mg/200 mL premix  Status:  Discontinued     1,000 mg 200 mL/hr over 60 Minutes Intravenous To Short Stay 10/27/2017 1631 11/03/2017 1637   11/04/2017 2100  vancomycin (VANCOCIN) IVPB 1000 mg/200 mL premix     1,000 mg 200 mL/hr over 60 Minutes Intravenous Every 12 hours 11/01/2017 1631 11/04/2017 2246   10/31/2017 0900  vancomycin (VANCOCIN) IVPB 1000 mg/200 mL premix     1,000 mg 200 mL/hr over 60 Minutes Intravenous To Surgery 10/11/2017 0857 10/23/2017 0957   10/17/17 0800  vancomycin (VANCOCIN) IVPB 750 mg/150 ml premix  Status:  Discontinued     750 mg 150 mL/hr over 60 Minutes Intravenous Every 24 hours 10/16/17 1220 10/17/17 1023   10/16/17 1800  anidulafungin (ERAXIS) 100 mg in sodium chloride 0.9 % 100 mL IVPB     100 mg 78 mL/hr over 100 Minutes Intravenous Every 24 hours 10/15/17 1657     10/16/17 0600  vancomycin (VANCOCIN) IVPB 1000 mg/200 mL premix  Status:  Discontinued      1,000 mg 200 mL/hr over 60 Minutes Intravenous Every 36 hours 10/23/2017 2308 10/16/17 1220   10/15/17 1800  anidulafungin (ERAXIS) 200 mg in sodium chloride 0.9 % 200 mL IVPB     200 mg 78 mL/hr over 200 Minutes Intravenous  Once 10/15/17 1657 10/15/17 2148   10/29/2017 2330  piperacillin-tazobactam (ZOSYN) IVPB 3.375 g  Status:  Discontinued     3.375 g 100 mL/hr over 30 Minutes Intravenous Every 8 hours 10/11/2017 2257 10/23/2017 2259   10/19/2017 2315  piperacillin-tazobactam (ZOSYN) IVPB 3.375 g     3.375 g 12.5 mL/hr over 240 Minutes Intravenous Every 8 hours 10/20/2017 2300     10/24/2017 1930  vancomycin (VANCOCIN) IVPB 1000 mg/200 mL premix     1,000 mg 200 mL/hr over 60 Minutes Intravenous  Once 10/27/2017 1915 10/16/2017 2044   11/03/2017 1530  piperacillin-tazobactam (ZOSYN) IVPB 3.375 g     3.375 g 100 mL/hr over 30 Minutes Intravenous  Once 10/29/2017 1529 10/11/2017 1604      Assessment/Plan: s/p Procedure(s): LEFT VIDEO ASSISTED THORACOSCOPY (VATS) for MEDISTINAL DRAINAGE (Left) Con't to mobilize CTs per CTS Pt was taking PO prior to mediastinal surgery.  Pt would likely benefit from PO intake if she can tolerate it.  I s/w Dr. Rodena Piety.   Pt's initial Gtube placement that I attempted to place failed last month due to tension on the Gtube with her hiatal hernia still in place and densely adhered to her mediastinum.  If she can tol PO well would likely be more beneficial than Gtube in my opinion.    LOS: 12 days    [name]., [facility] 10/26/2017

## 2017-10-26 NOTE — Progress Notes (Signed)
Chart reviewed and discussed with RN. Attempted to have initial [place] conversation with patient and she is nauseous and irritable stating "I do not want to talk today." She tells me her sister is her support and will be at the hospital tomorrow. PMT contact information given. Notified RN to administer prn Zofran. Voicemail left for sister, Louretta Parma. PMT will attempt [place] discussion with patient and sister tomorrow.   NO CHARGE  Ihor Dow, FNP-C Palliative Medicine Team   Phone: [phone] Fax: [phone]

## 2017-10-26 NOTE — Progress Notes (Signed)
PROGRESS NOTE    Danielle [name]  ONG:[id number] DOB: Apr 23, 1946 DOA: 10/29/2017 PCP: Iona Beard, MD Brief Narrative:71 y.o.F w/ a Hxof Crohn's disease, hiatal hernia,psoriasis, and SBO who underwent a type IV hiatal hernia repair in October 2018.Postoperatively the patient required endotracheal intubation, mechanical ventilation and the use of pressors. She was extubated on postop day #2. She improved clinically and was on parenteral nutrition, however she developed a pelvic fluid collection which required a drain. On 09/11/2017 she had blood cultures positive for Candida glabrata, and the pelvic abscess culture was positive for C. Albicans and C. Glabrata. She was given treatment withparenteral antifungals. She subsequently improved, was able to ambulate with physical therapy, weaned off of parenteral nutrition and was tolerating oral diet. ID recommended to continue Invanz andErasisfor 2 weeks. A PICC line was placed in her right arm and she was ultimately discharged home.  She returned on 10/01/2017 due to intermittent fevers. She was treated empirically and clinically improved to the point that she was able to be d/c home again on 11/1.  She returned to the ED 11/8 with complaints of persistent nausea associated with emesis.   Significant Events: 11/8 readmit 11/11 PICC placed 11/12 thoracentesis 11/5 to OR at [facility] for L VATS for mediastinal drainage     Assessment & Plan:   Principal Problem:   Septic shock ([place]) Active Problems:   Leukocytosis   Perianal Crohn's disease, with fistula ([place])   Acute renal failure superimposed on stage 3 chronic kidney disease ([place])   Incarcerated hiatal hernia s/p reduction 09/08/2017   Immunosuppressed status (HCC)   Hypotension   Nausea and vomiting   S/P thoracentesis   Anasarca   DVT (deep venous thrombosis) (HCC)   Protein-calorie malnutrition, moderate (HCC)   Mediastinal abscess (HCC)   Mediastinal post-op seromas   Pleural effusion, left   Patient's noncompliance with intervention / medical advice    Dehydration   Acute embolism and thrombosis of left axillary vein (HCC)  SevereSepsis/Mediastinal abscess -CT with reaccumulation of fluid collection in the lower mediastinum. Esophagram showing 2.5 cm area of extrinsic compression upon the esophagus. Possible smaller abscesses posterior margin spleen  -11/12 S/P LEFTThoracentesis: 780 mL fluid removed: Exudative, Gram stain with WBCs and PMNs -Per Dr. Windy Canny note from 11/9 he reviewed CT scan abdomen pelvis discussed with Dr. Johnny Bridge agree mediastinal abscess complex not amenable to endoscopic drainage. Recommended cardiothoracic surgery be consulted. -IR consulted: Thoracentesis completed. Per review of EMR not candidate for percutaneous drain placement,If drain felt necessary consider tertiary care center. -transferred to [facility] to OR for VATS drainage on 11/15 - immediate post-op care per TCTS, but pt to remain on [place]  - S/P VATS drainage on 11/15. Discussed with and management per TCTS team. ID recommendscontinuing antibiotics and monitor cultures. -Repeat CT scan of the chest done 10/25/2017-1. Ill-defined multiloculated rim enhancing fluid collection and extensive edema in posterior mediastinum inferior to mediastinal drain and anterior to aorta, probably representing the residual of drained abscess. Discrete 14 mm abscess present in the right posterior mediastinum. 2. Multiloculated rim enhancing fluid collection surrounding the spleen and extending into left pericolic gutter, likely abscess. 3. Small bilateral pleural effusions with left pleural drain. 4. Stable dependent lower lobe consolidations may represent atelectasis or pneumonia. -CT surgery following.  Hematemesis/hemoptysis on IV heparin lateral upper extremity DVT 09/2017. It appears that she had a PICC line in her upper extremity. Her of the upper extremities  shows a positive DVT in the left radial  vein at this time.  Patient has not had any more episodes of hematemesis or hemoptysis since yesterday.  She was started on an IV Protonix drip.  CT scan of the chest shows evidence of esophageal wall thickening consistent with esophagitis.  GI following.  Leukocytosis-she spiked a temp again last night a CT of the chest was ordered as above.  4 sets of blood cultures are already pending.  She is on Zosyn and antifungals/Eraxis.   infectious disease following.  CT of the abdomen and pelvis is ordered for today.follow up labs.  Hypertension blood pressure low to low normal. Monitor. Nutrition started on TPN.  Patient with a recent fungemia with candida glabrata and albicans.  I have placed a call to interventional radiology Dr. Kathlene Cote about placing a gastrostomy tube for feeding.  He will be seeing the patient in giving [place] his opinion.  Anemia due to chronic illness monitor levels.  Mild drop in hemoglobin otherwise stable without any blood transfusion.  Chronic diastolic CHF stable.   DVT prophylaxis: scd Code Status:full Family Communication:dw sister Disposition Plan:tbd Consultants:  GI,CT SURG,ID Procedures: CHEST TUBE Antimicrobials:  ZOSYN,ANIDULAFUNGIN Subjective:DONT FEEL SO WELL...  Objective:resting in bed.in nad. Vitals:   10/26/17 0428 10/26/17 0657 10/26/17 0716 10/26/17 0800  BP:   119/65   Pulse:   86   Resp: (!) 30  19 (!) 22  Temp:  99.1 F (37.3 C) 98.1 F (36.7 C)   TempSrc:  Rectal Oral   SpO2: 99%  99% 98%  Weight:      Height:        Intake/Output Summary (Last 24 hours) at 10/26/2017 1018 Last data filed at 10/26/2017 1000 Gross per 24 hour  Intake 2368.34 ml  Output 800 ml  Net 1568.34 ml   Filed Weights   10/15/17 1449 10/20/17 1600 10/23/2017 0500  Weight: 55.9 kg (123 lb 3.8 oz) 55.3 kg (121 lb 14.6 oz) 55.8 kg (123 lb 0.3 oz)    Examination:  General exam: Appears calm and comfortable    Respiratory system: Clear to auscultation. Respiratory effort normal. Cardiovascular system: S1 & S2 heard, RRR. No JVD, murmurs, rubs, gallops or clicks. No pedal edema. Gastrointestinal system: Abdomen is nondistended, soft and nontender. No organomegaly or masses felt. Normal bowel sounds heard. Central nervous system: Alert and oriented. No focal neurological deficits. Extremities: Symmetric 5 x 5 power. Skin: No rashes, lesions or ulcers Psychiatry: Judgement and insight appear normal. Mood & affect appropriate.     Data Reviewed: I have personally reviewed following labs and imaging studies  CBC: Recent Labs  Lab 10/23/17 0513 10/24/17 0006 10/25/17 0005 10/25/17 2045 10/26/17 0323  WBC 27.3* 24.5* 24.9* 31.8* 23.9*  NEUTROABS  --   --  18.2*  --  17.0*  HGB 11.3* 11.3* 9.6* 10.5* 9.2*  HCT 35.7* 34.5* 29.5* 32.0* 28.4*  MCV 84.2 84.1 84.0 83.3 84.3  PLT 327 300 274 291 160   Basic Metabolic Panel: Recent Labs  Lab 10/20/17 0418 10/30/2017 0321 10/22/17 0400 10/23/17 0513 10/24/17 0006 10/25/17 0005 10/26/17 0323  NA 135 134* 135 135 135 135 135  K 3.4* 3.4* 3.6 3.9 4.2 3.5 3.8  CL 112* 109 111 109 109 109 106  CO2 20* 19* 18* 18* 18* 20* 21*  GLUCOSE 109* 119* 205* 85 91 124* 114*  BUN 10 11 13 16 16 15 16   CREATININE 0.85 0.87 0.77 0.80 0.88 0.84 0.86  CALCIUM 7.6* 7.5* 8.0* 7.7* 7.9* 7.7* 7.9*  MG 2.4 1.8 1.6* 2.1 2.0 1.9  --   PHOS 3.1 2.3* 3.0 3.2  --  3.4  --    GFR: Estimated Creatinine Clearance: 54.4 mL/min (by C-G formula based on SCr of 0.86 mg/dL). Liver Function Tests: Recent Labs  Lab 10/20/17 0418 10/07/2017 0321 10/23/17 0513 10/25/17 0005  AST 117* 60* 28 32  ALT 50 37 27 28  ALKPHOS 109 112 111 145*  BILITOT 0.5 0.8 0.4 0.3  PROT 6.1* 6.0* 6.9 6.0*  ALBUMIN 1.6* 1.5* 1.6* 1.5*   No results for input(s): LIPASE, AMYLASE in the last 168 hours. No results for input(s): AMMONIA in the last 168 hours. Coagulation Profile: No results  for input(s): INR, PROTIME in the last 168 hours. Cardiac Enzymes: No results for input(s): CKTOTAL, CKMB, CKMBINDEX, TROPONINI in the last 168 hours. BNP (last 3 results) No results for input(s): PROBNP in the last 8760 hours. HbA1C: No results for input(s): HGBA1C in the last 72 hours. CBG: Recent Labs  Lab 10/25/17 0544 10/25/17 1506 10/25/17 1553 10/25/17 2119 10/26/17 0548  GLUCAP 120* 60* 70 117* 107*   Lipid Profile: Recent Labs    10/25/17 0333  TRIG 83   Thyroid Function Tests: No results for input(s): TSH, T4TOTAL, FREET4, T3FREE, THYROIDAB in the last 72 hours. Anemia Panel: No results for input(s): VITAMINB12, FOLATE, FERRITIN, TIBC, IRON, RETICCTPCT in the last 72 hours. Sepsis Labs: Recent Labs  Lab 10/26/17 0323  LATICACIDVEN 1.0    Recent Results (from the past 240 hour(s))  C difficile quick scan w PCR reflex     Status: None   Collection Time: 10/16/17  2:47 PM  Result Value Ref Range Status   C Diff antigen NEGATIVE NEGATIVE Final   C Diff toxin NEGATIVE NEGATIVE Final   C Diff interpretation No C. difficile detected.  Final  Gastrointestinal Panel by PCR , Stool     Status: None   Collection Time: 10/16/17  2:48 PM  Result Value Ref Range Status   Campylobacter species NOT DETECTED NOT DETECTED Final   Plesimonas shigelloides NOT DETECTED NOT DETECTED Final   Salmonella species NOT DETECTED NOT DETECTED Final   Yersinia enterocolitica NOT DETECTED NOT DETECTED Final   Vibrio species NOT DETECTED NOT DETECTED Final   Vibrio cholerae NOT DETECTED NOT DETECTED Final   Enteroaggregative E coli (EAEC) NOT DETECTED NOT DETECTED Final   Enteropathogenic E coli (EPEC) NOT DETECTED NOT DETECTED Final   Enterotoxigenic E coli (ETEC) NOT DETECTED NOT DETECTED Final   Shiga like toxin producing E coli (STEC) NOT DETECTED NOT DETECTED Final   Shigella/Enteroinvasive E coli (EIEC) NOT DETECTED NOT DETECTED Final   Cryptosporidium NOT DETECTED NOT DETECTED  Final   Cyclospora cayetanensis NOT DETECTED NOT DETECTED Final   Entamoeba histolytica NOT DETECTED NOT DETECTED Final   Giardia lamblia NOT DETECTED NOT DETECTED Final   Adenovirus F40/41 NOT DETECTED NOT DETECTED Final   Astrovirus NOT DETECTED NOT DETECTED Final   Norovirus GI/GII NOT DETECTED NOT DETECTED Final   Rotavirus A NOT DETECTED NOT DETECTED Final   Sapovirus (I, II, IV, and V) NOT DETECTED NOT DETECTED Final  Culture, fungus without smear     Status: None (Preliminary result)   Collection Time: 10/18/17  3:19 PM  Result Value Ref Range Status   Specimen Description Pleural, L  Final   Special Requests NONE  Final   Culture   Final    NO FUNGUS ISOLATED AFTER 7 DAYS Performed at [facility], [place] [address], [place], [place] [address]    Report Status PENDING  Incomplete  Culture, body fluid-bottle     Status: None   Collection Time: 10/18/17  3:19 PM  Result Value Ref Range Status   Specimen Description FLUID LEFT PLEURAL  Final   Special Requests BOTTLES DRAWN AEROBIC AND ANAEROBIC 10CC  Final   Culture   Final    NO GROWTH 5 DAYS Performed at [facility], [place] [address], [place], [place] [address]    Report Status 10/23/2017 FINAL  Final  Gram stain     Status: None   Collection Time: 10/18/17  3:19 PM  Result Value Ref Range Status   Specimen Description FLUID LEFT PLEURAL  Final   Special Requests NONE  Final   Gram Stain   Final    ABUNDANT WBC PRESENT,BOTH PMN AND MONONUCLEAR NO ORGANISMS SEEN Performed at [facility], [place] [address], [place], [place] [address]    Report Status 10/18/2017 FINAL  Final  Culture, blood (routine x 2)     Status: None   Collection Time: 10/19/17  9:45 AM  Result Value Ref Range Status   Specimen Description BLOOD BLOOD RIGHT FOREARM  Final   Special Requests IN PEDIATRIC BOTTLE Blood Culture adequate volume  Final   Culture   Final    NO GROWTH 5 DAYS Performed at [facility], [place] [address], [place], [place] [address]    Report Status 10/24/2017 FINAL  Final  Culture, blood (routine x 2)     Status: None   Collection Time: 10/19/17  9:48 AM  Result Value Ref Range Status   Specimen Description BLOOD RIGHT HAND  Final   Special Requests IN PEDIATRIC BOTTLE Blood Culture adequate volume  Final   Culture   Final    NO GROWTH 5 DAYS Performed at [facility], [place] [address] [place], [place] [address]    Report Status 10/24/2017 FINAL  Final  Culture, Urine     Status: None   Collection Time: 10/20/17  5:06 PM  Result Value Ref Range Status   Specimen Description URINE, CATHETERIZED  Final   Special Requests NONE  Final   Culture   Final    NO GROWTH Performed at [facility], [place] [address], [place], [place] [address]    Report Status 10/22/2017 FINAL  Final  Surgical PCR screen     Status: None   Collection Time: 11/02/2017  3:21 AM  Result Value Ref Range Status   MRSA, PCR NEGATIVE NEGATIVE Final   Staphylococcus aureus NEGATIVE NEGATIVE Final    Comment: (NOTE) The Xpert SA Assay (FDA approved for NASAL specimens in patients 40 years of age and older), is one component of a comprehensive surveillance program. It is not intended to diagnose infection nor to guide or monitor treatment.   Body fluid culture     Status: None   Collection Time: 10/19/2017  9:02 AM  Result Value Ref Range Status   Specimen Description FLUID  Final   Special Requests MEDISTINAL PATIENT ON FOLLOWING ZOSYN AND VANC  Final   Gram Stain   Final    MODERATE WBC PRESENT,BOTH PMN AND MONONUCLEAR NO ORGANISMS SEEN    Culture NO GROWTH 3 DAYS  Final   Report Status 10/24/2017 FINAL  Final  Body fluid culture     Status: None   Collection Time: 10/27/2017  9:02 AM  Result Value  Ref Range Status   Specimen Description FLUID PLEURAL LEFT  Final   Special Requests PATIENT ON FOLLOWING ZOSYN AND VANC  Final   Gram Stain   Final    MODERATE WBC PRESENT, PREDOMINANTLY MONONUCLEAR NO  ORGANISMS SEEN    Culture NO GROWTH 3 DAYS  Final   Report Status 10/24/2017 FINAL  Final  Fungus Culture With Stain     Status: None (Preliminary result)   Collection Time: 10/13/2017  9:02 AM  Result Value Ref Range Status   Fungus Stain Final report  Final    Comment: (NOTE) Performed At: [facility] [place], [place] [id number] Rush Farmer MD [id number]    Fungus (Mycology) Culture PENDING  Incomplete   Fungal Source FLUID  Final    Comment: PLEURAL LEFT PATIENT ON FOLLOWING ZOSYN AND VANC   Acid Fast Smear (AFB)     Status: None   Collection Time: 11/02/2017  9:02 AM  Result Value Ref Range Status   AFB Specimen Processing Concentration  Final   Acid Fast Smear Negative  Final    Comment: (NOTE) Performed At: [facility] [place], [place] [id number] Rush Farmer MD NO:[id number]    Source (AFB) FLUID  Final    Comment: PLEURAL LEFT PATIENT ON FOLLOWING ZOSYN AND VANC   Fungus Culture Result     Status: None   Collection Time: 10/18/2017  9:02 AM  Result Value Ref Range Status   Result 1 Comment  Final    Comment: (NOTE) KOH/Calcofluor preparation:  no fungus observed. Performed At: [facility] [place], [place] [id number] Rush Farmer MD [id number]   Fungus Culture With Stain     Status: None (Preliminary result)   Collection Time: 10/11/2017  9:42 AM  Result Value Ref Range Status   Fungus Stain Final report  Final    Comment: (NOTE) Performed At: [facility] [place], [place] [id number] Rush Farmer MD [id number]    Fungus (Mycology) Culture PENDING  Incomplete   Fungal Source FLUID  Final    Comment: MEDISTINAL PATIENT ON FOLLOWING ZOSYN AND VANC    Acid Fast Smear (AFB)     Status: None   Collection Time: 10/29/2017  9:42 AM  Result Value Ref Range Status   AFB Specimen Processing Concentration  Final   Acid Fast Smear Negative  Final     Comment: (NOTE) Performed At: [facility] [place], [place] [id number] Rush Farmer MD [id number]    Source (AFB) FLUID  Final    Comment: MEDISTINAL PATIENT ON FOLLOWING ZOSYN AND VANC   Fungus Culture Result     Status: None   Collection Time: 10/28/2017  9:42 AM  Result Value Ref Range Status   Result 1 Comment  Final    Comment: (NOTE) KOH/Calcofluor preparation:  no fungus observed. Performed At: [facility] [place], [place] [id number] Rush Farmer MD [id number]   Culture, blood (routine x 2)     Status: None (Preliminary result)   Collection Time: 10/24/17 12:06 AM  Result Value Ref Range Status   Specimen Description BLOOD LEFT FOOT  Final   Special Requests   Final    BOTTLES DRAWN AEROBIC AND ANAEROBIC Blood Culture adequate volume   Culture NO GROWTH 1 DAY  Final   Report Status PENDING  Incomplete  Culture, blood (routine x 2)     Status: None (Preliminary result)   Collection Time: 10/24/17 12:16  AM  Result Value Ref Range Status   Specimen Description BLOOD RIGHT FOOT  Final   Special Requests   Final    BOTTLES DRAWN AEROBIC ONLY Blood Culture results may not be optimal due to an inadequate volume of blood received in culture bottles   Culture NO GROWTH 1 DAY  Final   Report Status PENDING  Incomplete  Fungus culture, blood     Status: None (Preliminary result)   Collection Time: 10/24/17 12:16 AM  Result Value Ref Range Status   Specimen Description BLOOD LEFT FOOT  Final   Special Requests   Final    BOTTLES DRAWN AEROBIC AND ANAEROBIC Blood Culture adequate volume   Culture NO GROWTH 1 DAY  Final   Report Status PENDING  Incomplete  Culture, blood (routine x 2)     Status: None (Preliminary result)   Collection Time: 10/24/17  3:50 PM  Result Value Ref Range Status   Specimen Description BLOOD RIGHT FOOT  Final   Special Requests IN PEDIATRIC BOTTLE Blood Culture adequate volume  Final   Culture  NO GROWTH < 24 HOURS  Final   Report Status PENDING  Incomplete         Radiology Studies: Dg Chest 2 View  Result Date: 10/24/2017 CLINICAL DATA:  Hemoptysis EXAM: CHEST  2 VIEW COMPARISON:  Chest x-rays dated 10/24/2016 and 10/22/2017. FINDINGS: Heart size and mediastinal contours are stable. Lines and tubes are stable in the short-term interval. Bibasilar opacities are stable. Probable small left pleural effusion. Hiatal hernia better demonstrated on earlier exams. IMPRESSION: 1. No interval change. Patchy bibasilar opacities are stable, atelectasis versus aspiration. Probable small left pleural effusion. 2. Lines and tubes are stable in position. Electronically Signed   By: Franki Cabot M.D.   On: 10/24/2017 16:36   Ct Chest W Contrast  Result Date: 10/25/2017 CLINICAL DATA:  71 y/o F; history of paraesophageal hernia repair 09/08/2017 and mediastinal abscess post vats and drainage 10/12/2017. EXAM: CT CHEST WITH CONTRAST TECHNIQUE: Multidetector CT imaging of the chest was performed during intravenous contrast administration. CONTRAST:  72 cc Isovue-300 COMPARISON:  10/05/2017 CT of the chest. FINDINGS: Cardiovascular: Normal heart size. No pericardial effusion. Left subclavian line extends to the upper SVC. The left PICC line tip extends into the azygos vein. Normal caliber thoracic aorta. Normal caliber main pulmonary artery. Mediastinum/Nodes: Patulous and fluid-filled esophagus as well as large hiatal hernia. The lower esophagus is thick-walled near the gastroesophageal junction likely representing reactive inflammatory changes. Within the posterior inferior mediastinum in the region of the surgical drain to the left and posterior of the herniated gastric cardia and anterior to the descending thoracic aorta there is an ill-defined multiloculated extraluminal fluid collection spanning approximately in conglomerate 3.8 x 2.7 x 3.9 cm likely representing the drained abscess(AP x ML x CC  series 3, image 72 and series 5, image 73). Additionally, to the right and posterior of the herniated gastric cardia there is a discrete rim enhancing fluid collection measuring 14 mm likely representing a separate abscess loculation (series 3, image 82). Multiple stable thyroid nodules are present measuring up to 18 mm in the right lobe of the thyroid (series 5, image 33). Lungs/Pleura: Small left and moderate right pleural effusions are present. There is a left-sided chest tube and second left-sided drain traversing the left pleural space into the lower posterior mediastinum. No pneumothorax. Consolidations are present within the dependent lower lobes bilaterally. Upper Abdomen: Multiloculated rim enhancing fluid collections are present under  the diaphragm surrounding the spleen and extending into the left pericolic gutter the representing additional abscesses. Musculoskeletal: Multiple chronic bilateral rib fractures are present. No acute osseous abnormality is identified. IMPRESSION: 1. Ill-defined multiloculated rim enhancing fluid collection and extensive edema in posterior mediastinum inferior to mediastinal drain and anterior to aorta, probably representing the residual of drained abscess. Discrete 14 mm abscess present in the right posterior mediastinum. 2. Multiloculated rim enhancing fluid collection surrounding the spleen and extending into left pericolic gutter, likely abscess. 3. Small bilateral pleural effusions with left pleural drain. 4. Stable dependent lower lobe consolidations may represent atelectasis or pneumonia. Electronically Signed   By: Kristine Garbe M.D.   On: 10/25/2017 22:41        Scheduled Meds: . acetaminophen  1,000 mg Oral Q6H   Or  . acetaminophen (TYLENOL) oral liquid 160 mg/5 mL  1,000 mg Oral Q6H  . bisacodyl  10 mg Oral Daily  . fentaNYL   Intravenous Q4H  . insulin aspart  0-3 Units Subcutaneous 3 times per day  . iopamidol      . lip balm  1  application Topical BID  . [START ON 10/29/2017] pantoprazole  40 mg Intravenous Q12H  . senna-docusate  1 tablet Oral QHS   Continuous Infusions: . acetaminophen Stopped (10/26/17 0816)  . anidulafungin Stopped (10/25/17 2030)  . pantoprozole (PROTONIX) infusion 8 mg/hr (10/26/17 0231)  . piperacillin-tazobactam (ZOSYN)  IV Stopped (10/26/17 0949)  . potassium chloride Stopped (10/22/17 1145)  . TPN CYCLIC-ADULT (ION)       LOS: 12 days      Georgette Shell, MD [facility]  If 7PM-7AM, please contact night-coverage [email or web address] Password [email or web address] 10/26/2017, 10:18 AM

## 2017-10-26 NOTE — Progress Notes (Signed)
5 Days Post-Op Procedure(s) (LRB): LEFT VIDEO ASSISTED THORACOSCOPY (VATS) for MEDISTINAL DRAINAGE (Left) Subjective: "not sure how I am doing"  Objective: Vital signs in last 24 hours: Temp:  [97.9 F (36.6 C)-103 F (39.4 C)] 98.1 F (36.7 C) (11/20 0716) Pulse Rate:  [86-118] 86 (11/20 0716) Cardiac Rhythm: Normal sinus rhythm (11/20 0822) Resp:  [17-36] 22 (11/20 0800) BP: (111-138)/(57-71) 119/65 (11/20 0716) SpO2:  [95 %-100 %] 98 % (11/20 0800)  Hemodynamic parameters for last 24 hours:    Intake/Output from previous day: 11/19 0701 - 11/20 0700 In: 1634.6 [I.V.:1484.6; IV Piggyback:150] Out: 790 [Urine:750; Chest Tube:40] Intake/Output this shift: Total I/O In: -  Out: 20 [Chest Tube:20]  General appearance: alert, cooperative and no distress Neurologic: intact Heart: regular rate and rhythm Lungs: diminished breath sounds bibasilar  Lab Results: Recent Labs    10/25/17 2045 10/26/17 0323  WBC 31.8* 23.9*  HGB 10.5* 9.2*  HCT 32.0* 28.4*  PLT 291 253   BMET:  Recent Labs    10/25/17 0005 10/26/17 0323  NA 135 135  K 3.5 3.8  CL 109 106  CO2 20* 21*  GLUCOSE 124* 114*  BUN 15 16  CREATININE 0.84 0.86  CALCIUM 7.7* 7.9*    PT/INR: No results for input(s): LABPROT, INR in the last 72 hours. ABG    Component Value Date/Time   PHART 7.317 (L) 10/22/2017 0420   HCO3 17.9 (L) 10/22/2017 0420   TCO2 15 (L) 09/10/2017 1214   ACIDBASEDEF 7.1 (H) 10/22/2017 0420   O2SAT 96.1 10/22/2017 0420   CBG (last 3)  Recent Labs    10/25/17 1553 10/25/17 2119 10/26/17 0548  GLUCAP 70 117* 107*  CT CHEST WITH CONTRAST  TECHNIQUE: Multidetector CT imaging of the chest was performed during intravenous contrast administration.  CONTRAST:  72 cc Isovue-300  COMPARISON:  10/05/2017 CT of the chest.  FINDINGS: Cardiovascular: Normal heart size. No pericardial effusion. Left subclavian line extends to the upper SVC. The left PICC line tip extends  into the azygos vein. Normal caliber thoracic aorta. Normal caliber main pulmonary artery.  Mediastinum/Nodes: Patulous and fluid-filled esophagus as well as large hiatal hernia. The lower esophagus is thick-walled near the gastroesophageal junction likely representing reactive inflammatory changes.  Within the posterior inferior mediastinum in the region of the surgical drain to the left and posterior of the herniated gastric cardia and anterior to the descending thoracic aorta there is an ill-defined multiloculated extraluminal fluid collection spanning approximately in conglomerate 3.8 x 2.7 x 3.9 cm likely representing the drained abscess(AP x ML x CC series 3, image 72 and series 5, image 73).  Additionally, to the right and posterior of the herniated gastric cardia there is a discrete rim enhancing fluid collection measuring 14 mm likely representing a separate abscess loculation (series 3, image 82).  Multiple stable thyroid nodules are present measuring up to 18 mm in the right lobe of the thyroid (series 5, image 33).  Lungs/Pleura: Small left and moderate right pleural effusions are present. There is a left-sided chest tube and second left-sided drain traversing the left pleural space into the lower posterior mediastinum. No pneumothorax. Consolidations are present within the dependent lower lobes bilaterally.  Upper Abdomen: Multiloculated rim enhancing fluid collections are present under the diaphragm surrounding the spleen and extending into the left pericolic gutter the representing additional abscesses.  Musculoskeletal: Multiple chronic bilateral rib fractures are present. No acute osseous abnormality is identified.  IMPRESSION: 1. Ill-defined multiloculated rim enhancing fluid  collection and extensive edema in posterior mediastinum inferior to mediastinal drain and anterior to aorta, probably representing the residual of drained abscess. Discrete 14  mm abscess present in the right posterior mediastinum. 2. Multiloculated rim enhancing fluid collection surrounding the spleen and extending into left pericolic gutter, likely abscess. 3. Small bilateral pleural effusions with left pleural drain. 4. Stable dependent lower lobe consolidations may represent atelectasis or pneumonia.   Electronically Signed   By: Kristine Garbe M.D.   On: 10/25/2017 22:41 I personally reviewed the CT images and concur with above  Assessment/Plan: S/P Procedure(s) (LRB): LEFT VIDEO ASSISTED THORACOSCOPY (VATS) for MEDISTINAL DRAINAGE (Left) -mediastinal abscess post repair of GE hernia Spiked another fever last night On Zosyn and Eraxis Reviewed CT- mediastinal drain in good position still has a phlegmon in the area    LOS: 12 days    Melrose Nakayama 10/26/2017

## 2017-10-26 NOTE — Progress Notes (Signed)
Could not find where lactic acid had been done. Notified MD that pt has had elevated temps and WBC count.  Tressie Ellis, RN

## 2017-10-26 NOTE — Progress Notes (Addendum)
Notified MD of temp 103.3, orders received for tylenol.  Upon administering tylenol, patient vomited. MD notified.  Orders received.

## 2017-10-26 NOTE — Progress Notes (Signed)
IR aware of request for gastrostomy tube placement in patient with incarcerated hiatal  hernia.  Discussed case with Dr. Kathlene Cote.  Note patient did have an abdominal CT 10/15/2017, however due to complexity of medical status and anatomy, a new scan for evaluation of anatomy is warranted prior to placement attempt.  CT Abdomen has been ordered by primary team, however patient is currently refusing scan.  Patient is also scheduled for a goals of care meeting with her family tomorrow.  IR to follow.   Brynda Greathouse, MS RD PA-C 1:58 PM

## 2017-10-26 NOTE — Progress Notes (Signed)
Subjective: The patient was seen and examined at bedside. Denies further episode of vomiting blood. Rectal tube continues to drain liquid dark green stool. Denies abdominal pain, nausea and does not want to have by mouth diet.  Objective: Vital signs in last 24 hours: Temp:  [97.9 F (36.6 C)-103 F (39.4 C)] 98.1 F (36.7 C) (11/20 0716) Pulse Rate:  [86-118] 86 (11/20 0716) Resp:  [17-36] 22 (11/20 0800) BP: (111-138)/(57-71) 119/65 (11/20 0716) SpO2:  [95 %-100 %] 98 % (11/20 0800) Weight change:  Last BM Date: 10/25/17  PE: Chronically ill-appearing, obvious pallor GENERAL: Not in acute distress, rectal tube in place, left-sided chest tube in place, urinary catheter and central line noted as previously ABDOMEN: Soft, nondistended, nontender, normoactive bowel sounds, dressing noted in epigastric area  EXTREMITIES: No edema  Lab Results: Results for orders placed or performed during the hospital encounter of 10/13/2017 (from the past 48 hour(s))  Glucose, capillary     Status: None   Collection Time: 10/24/17  2:33 PM  Result Value Ref Range   Glucose-Capillary 84 65 - 99 mg/dL  Culture, blood (routine x 2)     Status: None (Preliminary result)   Collection Time: 10/24/17  3:50 PM  Result Value Ref Range   Specimen Description BLOOD RIGHT FOOT    Special Requests IN PEDIATRIC BOTTLE Blood Culture adequate volume    Culture NO GROWTH < 24 HOURS    Report Status PENDING   Glucose, capillary     Status: None   Collection Time: 10/24/17  7:42 PM  Result Value Ref Range   Glucose-Capillary 93 65 - 99 mg/dL  CBC     Status: Abnormal   Collection Time: 10/25/17 12:05 AM  Result Value Ref Range   WBC 24.9 (H) 4.0 - 10.5 K/uL   RBC 3.51 (L) 3.87 - 5.11 MIL/uL   Hemoglobin 9.6 (L) 12.0 - 15.0 g/dL   HCT 29.5 (L) 36.0 - 46.0 %   MCV 84.0 78.0 - 100.0 fL   MCH 27.4 26.0 - 34.0 pg   MCHC 32.5 30.0 - 36.0 g/dL   RDW 19.6 (H) 11.5 - 15.5 %   Platelets 274 150 - 400 K/uL   Differential     Status: Abnormal   Collection Time: 10/25/17 12:05 AM  Result Value Ref Range   Neutrophils Relative % 72 %   Neutro Abs 18.2 (H) 1.7 - 7.7 K/uL   Lymphocytes Relative 18 %   Lymphs Abs 4.4 (H) 0.7 - 4.0 K/uL   Monocytes Relative 9 %   Monocytes Absolute 2.1 (H) 0.1 - 1.0 K/uL   Eosinophils Relative 1 %   Eosinophils Absolute 0.1 0.0 - 0.7 K/uL   Basophils Relative 0 %   Basophils Absolute 0.1 0.0 - 0.1 K/uL  Comprehensive metabolic panel     Status: Abnormal   Collection Time: 10/25/17 12:05 AM  Result Value Ref Range   Sodium 135 135 - 145 mmol/L   Potassium 3.5 3.5 - 5.1 mmol/L   Chloride 109 101 - 111 mmol/L   CO2 20 (L) 22 - 32 mmol/L   Glucose, Bld 124 (H) 65 - 99 mg/dL   BUN 15 6 - 20 mg/dL   Creatinine, Ser 0.84 0.44 - 1.00 mg/dL   Calcium 7.7 (L) 8.9 - 10.3 mg/dL   Total Protein 6.0 (L) 6.5 - 8.1 g/dL   Albumin 1.5 (L) 3.5 - 5.0 g/dL   AST 32 15 - 41 U/L   ALT 28 14 -  54 U/L   Alkaline Phosphatase 145 (H) 38 - 126 U/L   Total Bilirubin 0.3 0.3 - 1.2 mg/dL   GFR calc non Af Amer >60 >60 mL/min   GFR calc Af Amer >60 >60 mL/min    Comment: (NOTE) The eGFR has been calculated using the CKD EPI equation. This calculation has not been validated in all clinical situations. eGFR's persistently <60 mL/min signify possible Chronic Kidney Disease.    Anion gap 6 5 - 15  Magnesium     Status: None   Collection Time: 10/25/17 12:05 AM  Result Value Ref Range   Magnesium 1.9 1.7 - 2.4 mg/dL  Phosphorus     Status: None   Collection Time: 10/25/17 12:05 AM  Result Value Ref Range   Phosphorus 3.4 2.5 - 4.6 mg/dL  Triglycerides     Status: None   Collection Time: 10/25/17  3:33 AM  Result Value Ref Range   Triglycerides 83 <150 mg/dL  Glucose, capillary     Status: Abnormal   Collection Time: 10/25/17  5:44 AM  Result Value Ref Range   Glucose-Capillary 120 (H) 65 - 99 mg/dL  Glucose, capillary     Status: Abnormal   Collection Time: 10/25/17  3:06  PM  Result Value Ref Range   Glucose-Capillary 60 (L) 65 - 99 mg/dL   Comment 1 Notify RN    Comment 2 Document in Chart   Glucose, capillary     Status: None   Collection Time: 10/25/17  3:53 PM  Result Value Ref Range   Glucose-Capillary 70 65 - 99 mg/dL  CBC     Status: Abnormal   Collection Time: 10/25/17  8:45 PM  Result Value Ref Range   WBC 31.8 (H) 4.0 - 10.5 K/uL   RBC 3.84 (L) 3.87 - 5.11 MIL/uL   Hemoglobin 10.5 (L) 12.0 - 15.0 g/dL   HCT 32.0 (L) 36.0 - 46.0 %   MCV 83.3 78.0 - 100.0 fL   MCH 27.3 26.0 - 34.0 pg   MCHC 32.8 30.0 - 36.0 g/dL   RDW 19.2 (H) 11.5 - 15.5 %   Platelets 291 150 - 400 K/uL  Glucose, capillary     Status: Abnormal   Collection Time: 10/25/17  9:19 PM  Result Value Ref Range   Glucose-Capillary 117 (H) 65 - 99 mg/dL   Comment 1 Notify RN    Comment 2 Document in Chart   Basic metabolic panel     Status: Abnormal   Collection Time: 10/26/17  3:23 AM  Result Value Ref Range   Sodium 135 135 - 145 mmol/L   Potassium 3.8 3.5 - 5.1 mmol/L   Chloride 106 101 - 111 mmol/L   CO2 21 (L) 22 - 32 mmol/L   Glucose, Bld 114 (H) 65 - 99 mg/dL   BUN 16 6 - 20 mg/dL   Creatinine, Ser 0.86 0.44 - 1.00 mg/dL   Calcium 7.9 (L) 8.9 - 10.3 mg/dL   GFR calc non Af Amer >60 >60 mL/min   GFR calc Af Amer >60 >60 mL/min    Comment: (NOTE) The eGFR has been calculated using the CKD EPI equation. This calculation has not been validated in all clinical situations. eGFR's persistently <60 mL/min signify possible Chronic Kidney Disease.    Anion gap 8 5 - 15  CBC with Differential/Platelet     Status: Abnormal   Collection Time: 10/26/17  3:23 AM  Result Value Ref Range   WBC 23.9 (H)  4.0 - 10.5 K/uL   RBC 3.37 (L) 3.87 - 5.11 MIL/uL   Hemoglobin 9.2 (L) 12.0 - 15.0 g/dL   HCT 28.4 (L) 36.0 - 46.0 %   MCV 84.3 78.0 - 100.0 fL   MCH 27.3 26.0 - 34.0 pg   MCHC 32.4 30.0 - 36.0 g/dL   RDW 19.7 (H) 11.5 - 15.5 %   Platelets 253 150 - 400 K/uL    Neutrophils Relative % 71 %   Neutro Abs 17.0 (H) 1.7 - 7.7 K/uL   Lymphocytes Relative 17 %   Lymphs Abs 4.1 (H) 0.7 - 4.0 K/uL   Monocytes Relative 11 %   Monocytes Absolute 2.5 (H) 0.1 - 1.0 K/uL   Eosinophils Relative 1 %   Eosinophils Absolute 0.2 0.0 - 0.7 K/uL   Basophils Relative 0 %   Basophils Absolute 0.1 0.0 - 0.1 K/uL  Lactic acid, plasma     Status: None   Collection Time: 10/26/17  3:23 AM  Result Value Ref Range   Lactic Acid, Venous 1.0 0.5 - 1.9 mmol/L  Glucose, capillary     Status: Abnormal   Collection Time: 10/26/17  5:48 AM  Result Value Ref Range   Glucose-Capillary 107 (H) 65 - 99 mg/dL   Comment 1 Notify RN    Comment 2 Document in Chart     Studies/Results: Dg Chest 2 View  Result Date: 10/24/2017 CLINICAL DATA:  Hemoptysis EXAM: CHEST  2 VIEW COMPARISON:  Chest x-rays dated 10/24/2016 and 10/22/2017. FINDINGS: Heart size and mediastinal contours are stable. Lines and tubes are stable in the short-term interval. Bibasilar opacities are stable. Probable small left pleural effusion. Hiatal hernia better demonstrated on earlier exams. IMPRESSION: 1. No interval change. Patchy bibasilar opacities are stable, atelectasis versus aspiration. Probable small left pleural effusion. 2. Lines and tubes are stable in position. Electronically Signed   By: Franki Cabot M.D.   On: 10/24/2017 16:36   Ct Chest W Contrast  Result Date: 10/25/2017 CLINICAL DATA:  71 y/o F; history of paraesophageal hernia repair 09/08/2017 and mediastinal abscess post vats and drainage 10/27/2017. EXAM: CT CHEST WITH CONTRAST TECHNIQUE: Multidetector CT imaging of the chest was performed during intravenous contrast administration. CONTRAST:  72 cc Isovue-300 COMPARISON:  10/05/2017 CT of the chest. FINDINGS: Cardiovascular: Normal heart size. No pericardial effusion. Left subclavian line extends to the upper SVC. The left PICC line tip extends into the azygos vein. Normal caliber thoracic  aorta. Normal caliber main pulmonary artery. Mediastinum/Nodes: Patulous and fluid-filled esophagus as well as large hiatal hernia. The lower esophagus is thick-walled near the gastroesophageal junction likely representing reactive inflammatory changes. Within the posterior inferior mediastinum in the region of the surgical drain to the left and posterior of the herniated gastric cardia and anterior to the descending thoracic aorta there is an ill-defined multiloculated extraluminal fluid collection spanning approximately in conglomerate 3.8 x 2.7 x 3.9 cm likely representing the drained abscess(AP x ML x CC series 3, image 72 and series 5, image 73). Additionally, to the right and posterior of the herniated gastric cardia there is a discrete rim enhancing fluid collection measuring 14 mm likely representing a separate abscess loculation (series 3, image 82). Multiple stable thyroid nodules are present measuring up to 18 mm in the right lobe of the thyroid (series 5, image 33). Lungs/Pleura: Small left and moderate right pleural effusions are present. There is a left-sided chest tube and second left-sided drain traversing the left pleural space into the  lower posterior mediastinum. No pneumothorax. Consolidations are present within the dependent lower lobes bilaterally. Upper Abdomen: Multiloculated rim enhancing fluid collections are present under the diaphragm surrounding the spleen and extending into the left pericolic gutter the representing additional abscesses. Musculoskeletal: Multiple chronic bilateral rib fractures are present. No acute osseous abnormality is identified. IMPRESSION: 1. Ill-defined multiloculated rim enhancing fluid collection and extensive edema in posterior mediastinum inferior to mediastinal drain and anterior to aorta, probably representing the residual of drained abscess. Discrete 14 mm abscess present in the right posterior mediastinum. 2. Multiloculated rim enhancing fluid collection  surrounding the spleen and extending into left pericolic gutter, likely abscess. 3. Small bilateral pleural effusions with left pleural drain. 4. Stable dependent lower lobe consolidations may represent atelectasis or pneumonia. Electronically Signed   By: Kristine Garbe M.D.   On: 10/25/2017 22:41    Medications: I have reviewed the patient's current medications.  Assessment: 1.Hematemesis -resolved , patulous and fluid-filled esophagus along with a large hiatal hernia noted on CT chest, lower esophagus is thick-walled near GE junction representing reactive inflammatory changes , hemoglobin 9.6 yesterday, 10.5 and evening without transfusion, 9.2 today morning, normal BUN. 2.multiloculated fluid collection and extensive edema in posterior mediastinum , around spleen and left pericolic gutter , small bilateral pleural effusion with left lower drain  3.significant leukocytosis , normocytic anemia  4. 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 Hematemesis likely secondary to esophagitis, hemodynamically stable, hemoglobin stable without transfusion, patient continued on IV Protonix drip, remains nothing by mouth post hiatal hernia surgery in October, on IV TPN, no plans for endoscopy currently. 2. Multiple mediastinal abscesses and abdominal abscesses,elevated leukocytosis-currently on IV Zosyn and IV anidulafungin 3. Protein calorie malnutrition, we may want to consider IR guided gastrostomy tube placement for maintaining long-term nutritional requirement as patient has been on IV TPN for prolonged period of time    Ronnette Juniper 10/26/2017, 8:21 AM   Pager [phone] If no answer or after 5 PM call [phone]

## 2017-10-26 NOTE — Progress Notes (Signed)
Patient refusing to travel to CT scan, saying she is "tired of going back and forth to scans, I had one yesterday". RN stressed importance of scan, but patient is adamantly refusing. MD paged, will continue to follow.

## 2017-10-27 ENCOUNTER — Inpatient Hospital Stay (HOSPITAL_COMMUNITY): Payer: Medicare HMO

## 2017-10-27 DIAGNOSIS — K440 Diaphragmatic hernia with obstruction, without gangrene: Secondary | ICD-10-CM

## 2017-10-27 DIAGNOSIS — R042 Hemoptysis: Secondary | ICD-10-CM

## 2017-10-27 DIAGNOSIS — D899 Disorder involving the immune mechanism, unspecified: Secondary | ICD-10-CM

## 2017-10-27 DIAGNOSIS — R509 Fever, unspecified: Secondary | ICD-10-CM

## 2017-10-27 DIAGNOSIS — Z452 Encounter for adjustment and management of vascular access device: Secondary | ICD-10-CM

## 2017-10-27 DIAGNOSIS — R1110 Vomiting, unspecified: Secondary | ICD-10-CM

## 2017-10-27 DIAGNOSIS — R601 Generalized edema: Secondary | ICD-10-CM

## 2017-10-27 DIAGNOSIS — N179 Acute kidney failure, unspecified: Secondary | ICD-10-CM

## 2017-10-27 DIAGNOSIS — Z9119 Patient's noncompliance with other medical treatment and regimen: Secondary | ICD-10-CM

## 2017-10-27 DIAGNOSIS — N183 Chronic kidney disease, stage 3 (moderate): Secondary | ICD-10-CM

## 2017-10-27 DIAGNOSIS — E860 Dehydration: Secondary | ICD-10-CM

## 2017-10-27 DIAGNOSIS — R339 Retention of urine, unspecified: Secondary | ICD-10-CM

## 2017-10-27 DIAGNOSIS — J9859 Other diseases of mediastinum, not elsewhere classified: Secondary | ICD-10-CM

## 2017-10-27 DIAGNOSIS — E440 Moderate protein-calorie malnutrition: Secondary | ICD-10-CM

## 2017-10-27 DIAGNOSIS — R1312 Dysphagia, oropharyngeal phase: Secondary | ICD-10-CM

## 2017-10-27 DIAGNOSIS — Z09 Encounter for follow-up examination after completed treatment for conditions other than malignant neoplasm: Secondary | ICD-10-CM

## 2017-10-27 DIAGNOSIS — Z7189 Other specified counseling: Secondary | ICD-10-CM

## 2017-10-27 DIAGNOSIS — K50113 Crohn's disease of large intestine with fistula: Secondary | ICD-10-CM

## 2017-10-27 DIAGNOSIS — Z9689 Presence of other specified functional implants: Secondary | ICD-10-CM

## 2017-10-27 DIAGNOSIS — Z515 Encounter for palliative care: Secondary | ICD-10-CM

## 2017-10-27 DIAGNOSIS — K449 Diaphragmatic hernia without obstruction or gangrene: Secondary | ICD-10-CM

## 2017-10-27 DIAGNOSIS — B999 Unspecified infectious disease: Secondary | ICD-10-CM

## 2017-10-27 LAB — CBC WITH DIFFERENTIAL/PLATELET
BASOS ABS: 0.1 10*3/uL (ref 0.0–0.1)
BASOS PCT: 0 %
EOS ABS: 0.3 10*3/uL (ref 0.0–0.7)
EOS PCT: 2 %
HCT: 28.1 % — ABNORMAL LOW (ref 36.0–46.0)
Hemoglobin: 8.9 g/dL — ABNORMAL LOW (ref 12.0–15.0)
Lymphocytes Relative: 20 %
Lymphs Abs: 3.9 10*3/uL (ref 0.7–4.0)
MCH: 27.1 pg (ref 26.0–34.0)
MCHC: 31.7 g/dL (ref 30.0–36.0)
MCV: 85.4 fL (ref 78.0–100.0)
MONO ABS: 2.1 10*3/uL — AB (ref 0.1–1.0)
Monocytes Relative: 11 %
Neutro Abs: 13.1 10*3/uL — ABNORMAL HIGH (ref 1.7–7.7)
Neutrophils Relative %: 67 %
PLATELETS: 266 10*3/uL (ref 150–400)
RBC: 3.29 MIL/uL — AB (ref 3.87–5.11)
RDW: 19.7 % — AB (ref 11.5–15.5)
WBC: 19.5 10*3/uL — AB (ref 4.0–10.5)

## 2017-10-27 LAB — GLUCOSE, CAPILLARY
GLUCOSE-CAPILLARY: 109 mg/dL — AB (ref 65–99)
Glucose-Capillary: 70 mg/dL (ref 65–99)
Glucose-Capillary: 73 mg/dL (ref 65–99)
Glucose-Capillary: 91 mg/dL (ref 65–99)

## 2017-10-27 LAB — BASIC METABOLIC PANEL
ANION GAP: 5 (ref 5–15)
BUN: 18 mg/dL (ref 6–20)
CALCIUM: 7.9 mg/dL — AB (ref 8.9–10.3)
CO2: 23 mmol/L (ref 22–32)
CREATININE: 0.85 mg/dL (ref 0.44–1.00)
Chloride: 106 mmol/L (ref 101–111)
GFR calc Af Amer: >60 mL/min (ref >60)
Glucose, Bld: 116 mg/dL — ABNORMAL HIGH (ref 65–99)
Potassium: 4.5 mmol/L (ref 3.5–5.1)
SODIUM: 134 mmol/L — AB (ref 135–145)

## 2017-10-27 MED ORDER — ACETAMINOPHEN 10 MG/ML IV SOLN
1000.0000 mg | Freq: Three times a day (TID) | INTRAVENOUS | Status: DC | PRN
  Administered 2017-10-27: 1000 mg via INTRAVENOUS
  Filled 2017-10-27 (×3): qty 100

## 2017-10-27 MED ORDER — MEROPENEM 1 G IV SOLR
1.0000 g | Freq: Three times a day (TID) | INTRAVENOUS | Status: DC
  Administered 2017-10-27 – 2017-11-02 (×18): 1 g via INTRAVENOUS
  Filled 2017-10-27 (×19): qty 1

## 2017-10-27 MED ORDER — PROMETHAZINE HCL 25 MG/ML IJ SOLN: 6.2500 mg | Freq: Three times a day (TID) | INTRAMUSCULAR | Status: DC

## 2017-10-27 MED ORDER — PROMETHAZINE HCL 25 MG/ML IJ SOLN: 6.2500 mg | Freq: Four times a day (QID) | INTRAMUSCULAR | Status: DC | PRN

## 2017-10-27 MED ORDER — PROMETHAZINE HCL 25 MG/ML IJ SOLN
12.5000 mg | Freq: Three times a day (TID) | INTRAMUSCULAR | Status: DC
  Administered 2017-10-27 (×2): 12.5 mg via INTRAVENOUS
  Filled 2017-10-27 (×2): qty 1

## 2017-10-27 MED ORDER — DEXTROSE 70 % IV SOLN
INTRAVENOUS | Status: AC
  Administered 2017-10-27: 17:00:00 via INTRAVENOUS
  Filled 2017-10-27 (×2): qty 840

## 2017-10-27 MED ORDER — PROMETHAZINE HCL 25 MG/ML IJ SOLN
12.5000 mg | Freq: Four times a day (QID) | INTRAMUSCULAR | Status: DC | PRN
  Administered 2017-10-27: 12.5 mg via INTRAVENOUS
  Filled 2017-10-27 (×2): qty 1

## 2017-10-27 MED ORDER — PRO-STAT SUGAR FREE PO LIQD
30.0000 mL | Freq: Two times a day (BID) | ORAL | Status: DC
  Filled 2017-10-27 (×2): qty 30

## 2017-10-27 MED ORDER — LEVOFLOXACIN IN D5W 750 MG/150ML IV SOLN
750.0000 mg | INTRAVENOUS | Status: DC
  Administered 2017-10-27: 750 mg via INTRAVENOUS
  Filled 2017-10-27: qty 150

## 2017-10-27 NOTE — Progress Notes (Addendum)
[facility]    Date of Admission:  10/25/2017   Total days of antibiotics 13        Day 13 anidula            Day 13 piptazo           ID: Danielle [name] is a 71 y.o. female with type IV hiatal hernia repair on 32/4 complicated by fungemia, intra-abdominal abscess as well as mediastinal abscess s/p VATS on 11/15, currently maintained on TPN and broad spectrum abtx.  Principal Problem:   Septic shock (Danielle [name]) Active Problems:   Leukocytosis   Perianal Crohn's disease, with fistula (Danielle [name])   Acute renal failure superimposed on stage 3 chronic kidney disease (Danielle [name])   Incarcerated hiatal hernia s/p reduction 09/08/2017   Immunosuppressed status (Danielle [name])   Hypotension   Nausea and vomiting   S/P thoracentesis   Anasarca   DVT (deep venous thrombosis) (Danielle [name])   Protein-calorie malnutrition, moderate (Danielle [name])   Mediastinal abscess (Danielle [name])   Mediastinal post-op seromas   Pleural effusion, left   Patient's noncompliance with intervention / medical advice    Dehydration   Acute embolism and thrombosis of left axillary vein (Danielle [name])    Subjective: Had significant nausea and vomiting yesterday. Continues to have high fevers of 103.16F, though WBc down to 19 from 24. She feels better today, feels that phenergan is helping. She reports feeling cold with cooling blanket in place     Medications:  . acetaminophen  1,000 mg Oral Q6H   Or  . acetaminophen (TYLENOL) oral liquid 160 mg/5 mL  1,000 mg Oral Q6H  . bisacodyl  10 mg Oral Daily  . feeding supplement (PRO-STAT SUGAR FREE 64)  30 mL Oral BID  . fentaNYL   Intravenous Q4H  . lip balm  1 application Topical BID  . [START ON 10/29/2017] pantoprazole  40 mg Intravenous Q12H  . promethazine  12.5 mg Intravenous TID AC  . senna-docusate  1 tablet Oral QHS    Objective: Vital signs in last 24 hours: Temp:  [97.9 F (36.6 C)-103.3 F (39.6 C)] 101.6 F (38.7 C) (11/21 0908) Pulse Rate:  [95-116] 105 (11/21 0822) Resp:   [20-36] 34 (11/21 1200) BP: (108-165)/(59-82) 130/64 (11/21 0700) SpO2:  [96 %-100 %] 97 % (11/21 1200) Constitutional:  oriented to person, place, and time. Appears chronically ill. No distress.  HENT: Danielle [name], PERRLA, no scleral icterus Mouth/Throat: Oropharynx is clear and moist. No oropharyngeal exudate.  Cardiovascular: tachy, regular rhythm and normal heart sounds. Exam reveals no gallop and no friction rub.  Pulmonary/Chest: Effort normal and breath sounds normal, except decreased on the left. has no wheezes. chest tube from VATS are in place, little drainage  From yesterday in collection system Abdominal: Soft. Bowel sounds are decrease.  exhibits no distension. There is mild epigastric tenderness. Dressing in epigastric region, rectal tube in place   Lab Results Recent Labs    10/26/17 0323 10/27/17 0500  WBC 23.9* 19.5*  HGB 9.2* 8.9*  HCT 28.4* 28.1*  NA 135 134*  K 3.8 4.5  CL 106 106  CO2 21* 23  BUN 16 18  CREATININE 0.86 0.85   Liver Panel Recent Labs    10/25/17 0005  PROT 6.0*  ALBUMIN 1.5*  AST 32  ALT 28  ALKPHOS 145*  BILITOT 0.3   Lab Results  Component Value Date   ESRSEDRATE 79 (H) 10/24/2017    Microbiology: 11/15 pleural fluid NGTD 11/18  blood cx NGTD Studies/Results: Ct Chest W Contrast  Result Date: 10/25/2017 CLINICAL DATA:  71 y/o F; history of paraesophageal hernia repair 09/08/2017 and mediastinal abscess post vats and drainage 10/16/2017. EXAM: CT CHEST WITH CONTRAST TECHNIQUE: Multidetector CT imaging of the chest was performed during intravenous contrast administration. CONTRAST:  72 cc Isovue-300 COMPARISON:  10/05/2017 CT of the chest. FINDINGS: Cardiovascular: Normal heart size. No pericardial effusion. Left subclavian line extends to the upper SVC. The left PICC line tip extends into the azygos vein. Normal caliber thoracic aorta. Normal caliber main pulmonary artery. Mediastinum/Nodes: Patulous and fluid-filled esophagus as well as  large hiatal hernia. The lower esophagus is thick-walled near the gastroesophageal junction likely representing reactive inflammatory changes. Within the posterior inferior mediastinum in the region of the surgical drain to the left and posterior of the herniated gastric cardia and anterior to the descending thoracic aorta there is an ill-defined multiloculated extraluminal fluid collection spanning approximately in conglomerate 3.8 x 2.7 x 3.9 cm likely representing the drained abscess(AP x ML x CC series 3, image 72 and series 5, image 73). Additionally, to the right and posterior of the herniated gastric cardia there is a discrete rim enhancing fluid collection measuring 14 mm likely representing a separate abscess loculation (series 3, image 82). Multiple stable thyroid nodules are present measuring up to 18 mm in the right lobe of the thyroid (series 5, image 33). Lungs/Pleura: Small left and moderate right pleural effusions are present. There is a left-sided chest tube and second left-sided drain traversing the left pleural space into the lower posterior mediastinum. No pneumothorax. Consolidations are present within the dependent lower lobes bilaterally. Upper Abdomen: Multiloculated rim enhancing fluid collections are present under the diaphragm surrounding the spleen and extending into the left pericolic gutter the representing additional abscesses. Musculoskeletal: Multiple chronic bilateral rib fractures are present. No acute osseous abnormality is identified. IMPRESSION: 1. Ill-defined multiloculated rim enhancing fluid collection and extensive edema in posterior mediastinum inferior to mediastinal drain and anterior to aorta, probably representing the residual of drained abscess. Discrete 14 mm abscess present in the right posterior mediastinum. 2. Multiloculated rim enhancing fluid collection surrounding the spleen and extending into left pericolic gutter, likely abscess. 3. Small bilateral pleural  effusions with left pleural drain. 4. Stable dependent lower lobe consolidations may represent atelectasis or pneumonia. Electronically Signed   By: Kristine Garbe M.D.   On: 10/25/2017 22:41   Dg Chest Port 1 View  Result Date: 10/27/2017 CLINICAL DATA:  Fever EXAM: PORTABLE CHEST 1 VIEW COMPARISON:  10/24/2017 FINDINGS: Left dialysis catheter and PICC line remain in place, unchanged. PICC line courses superiorly, unchanged. Interval removal of left chest tube. No pneumothorax. Mild cardiomegaly. Bilateral perihilar and lower lobe opacities, similar to prior study. IMPRESSION: Interval removal of left chest tube without pneumothorax. Otherwise no significant change. Electronically Signed   By: Rolm Baptise M.D.   On: 10/27/2017 09:45     Assessment/Plan: Polymicrobial intra-thoracic and intra-abdominal infection =   continue with boadspectrum abtx including antifungal coverage for known c.glabrata infection. I think her fevers are due to source control that she has known abscess intra abdominally and mediastinal which are difficult to drain via IR and recent surgery  Difficult situation given extent of infection/burden of disease/deconditioning  - she was started on levofloxacin which would not be a good agent. I will discontinue and change her to meropenem plus anidulafungin  - length of therapy depends on resolution of fluid collections  Nausea/vomiting = unable  to tolerate oral intake, which makes  transition off of parental nutrition not feasible. For now continue with TPN. Anti-emetics being increased to help with symptoms  Fevers = likely due to not having source control of infection. Still high spiking fevers despite receiving anti pyretics.  I support recommendation to have goals of care/palliative care consultation  Dr Johnnye Sima available for questions over the next 4 days.  Baxter Flattery [facility] Cell: [phone] Pager:  [phone]  10/27/2017, 12:23 PM

## 2017-10-27 NOTE — Progress Notes (Signed)
Subjective: The patient was seen and examined at bedside. She had a fever of 103.3 and needed coding blankets. She tried drinking fluids yesterday but had several episodes of vomiting thereafter. No further episodes of vomiting blood. Patient denies nausea currently and does not have abdominal pain.  Objective: Vital signs in last 24 hours: Temp:  [97.9 F (36.6 C)-103.3 F (39.6 C)] 100.7 F (38.2 C) (11/21 2035) Pulse Rate:  [95-116] 105 (11/21 0822) Resp:  [20-36] 30 (11/21 0822) BP: (108-185)/(59-91) 130/64 (11/21 0700) SpO2:  [96 %-100 %] 99 % (11/21 0700) Weight change:  Last BM Date: 10/25/17  PE:not in acute distress GENERAL:chronically ill-appearing, obvious pallor ABDOMEN:soft, non distended, normoactive bowel sounds EXTREMITIES:no deformity  Lab Results: Results for orders placed or performed during the hospital encounter of 10/17/2017 (from the past 48 hour(s))  Glucose, capillary     Status: Abnormal   Collection Time: 10/25/17  3:06 PM  Result Value Ref Range   Glucose-Capillary 60 (L) 65 - 99 mg/dL   Comment 1 Notify RN    Comment 2 Document in Chart   Glucose, capillary     Status: None   Collection Time: 10/25/17  3:53 PM  Result Value Ref Range   Glucose-Capillary 70 65 - 99 mg/dL  CBC     Status: Abnormal   Collection Time: 10/25/17  8:45 PM  Result Value Ref Range   WBC 31.8 (H) 4.0 - 10.5 K/uL   RBC 3.84 (L) 3.87 - 5.11 MIL/uL   Hemoglobin 10.5 (L) 12.0 - 15.0 g/dL   HCT 32.0 (L) 36.0 - 46.0 %   MCV 83.3 78.0 - 100.0 fL   MCH 27.3 26.0 - 34.0 pg   MCHC 32.8 30.0 - 36.0 g/dL   RDW 19.2 (H) 11.5 - 15.5 %   Platelets 291 150 - 400 K/uL  Glucose, capillary     Status: Abnormal   Collection Time: 10/25/17  9:19 PM  Result Value Ref Range   Glucose-Capillary 117 (H) 65 - 99 mg/dL   Comment 1 Notify RN    Comment 2 Document in Chart   Basic metabolic panel     Status: Abnormal   Collection Time: 10/26/17  3:23 AM  Result Value Ref Range   Sodium  135 135 - 145 mmol/L   Potassium 3.8 3.5 - 5.1 mmol/L   Chloride 106 101 - 111 mmol/L   CO2 21 (L) 22 - 32 mmol/L   Glucose, Bld 114 (H) 65 - 99 mg/dL   BUN 16 6 - 20 mg/dL   Creatinine, Ser 0.86 0.44 - 1.00 mg/dL   Calcium 7.9 (L) 8.9 - 10.3 mg/dL   GFR calc non Af Amer >60 >60 mL/min   GFR calc Af Amer >60 >60 mL/min    Comment: (NOTE) The eGFR has been calculated using the CKD EPI equation. This calculation has not been validated in all clinical situations. eGFR's persistently <60 mL/min signify possible Chronic Kidney Disease.    Anion gap 8 5 - 15  CBC with Differential/Platelet     Status: Abnormal   Collection Time: 10/26/17  3:23 AM  Result Value Ref Range   WBC 23.9 (H) 4.0 - 10.5 K/uL   RBC 3.37 (L) 3.87 - 5.11 MIL/uL   Hemoglobin 9.2 (L) 12.0 - 15.0 g/dL   HCT 28.4 (L) 36.0 - 46.0 %   MCV 84.3 78.0 - 100.0 fL   MCH 27.3 26.0 - 34.0 pg   MCHC 32.4 30.0 - 36.0 g/dL  RDW 19.7 (H) 11.5 - 15.5 %   Platelets 253 150 - 400 K/uL   Neutrophils Relative % 71 %   Neutro Abs 17.0 (H) 1.7 - 7.7 K/uL   Lymphocytes Relative 17 %   Lymphs Abs 4.1 (H) 0.7 - 4.0 K/uL   Monocytes Relative 11 %   Monocytes Absolute 2.5 (H) 0.1 - 1.0 K/uL   Eosinophils Relative 1 %   Eosinophils Absolute 0.2 0.0 - 0.7 K/uL   Basophils Relative 0 %   Basophils Absolute 0.1 0.0 - 0.1 K/uL  Lactic acid, plasma     Status: None   Collection Time: 10/26/17  3:23 AM  Result Value Ref Range   Lactic Acid, Venous 1.0 0.5 - 1.9 mmol/L  Glucose, capillary     Status: Abnormal   Collection Time: 10/26/17  5:48 AM  Result Value Ref Range   Glucose-Capillary 107 (H) 65 - 99 mg/dL   Comment 1 Notify RN    Comment 2 Document in Chart   Glucose, capillary     Status: Abnormal   Collection Time: 10/26/17  3:13 PM  Result Value Ref Range   Glucose-Capillary 55 (L) 65 - 99 mg/dL   Comment 1 Notify RN    Comment 2 Document in Chart   Glucose, capillary     Status: Abnormal   Collection Time: 10/26/17  9:08  PM  Result Value Ref Range   Glucose-Capillary 64 (L) 65 - 99 mg/dL   Comment 1 Notify RN    Comment 2 Document in Chart   CBC with Differential/Platelet     Status: Abnormal   Collection Time: 10/27/17  5:00 AM  Result Value Ref Range   WBC 19.5 (H) 4.0 - 10.5 K/uL   RBC 3.29 (L) 3.87 - 5.11 MIL/uL   Hemoglobin 8.9 (L) 12.0 - 15.0 g/dL   HCT 28.1 (L) 36.0 - 46.0 %   MCV 85.4 78.0 - 100.0 fL   MCH 27.1 26.0 - 34.0 pg   MCHC 31.7 30.0 - 36.0 g/dL   RDW 19.7 (H) 11.5 - 15.5 %   Platelets 266 150 - 400 K/uL   Neutrophils Relative % 67 %   Neutro Abs 13.1 (H) 1.7 - 7.7 K/uL   Lymphocytes Relative 20 %   Lymphs Abs 3.9 0.7 - 4.0 K/uL   Monocytes Relative 11 %   Monocytes Absolute 2.1 (H) 0.1 - 1.0 K/uL   Eosinophils Relative 2 %   Eosinophils Absolute 0.3 0.0 - 0.7 K/uL   Basophils Relative 0 %   Basophils Absolute 0.1 0.0 - 0.1 K/uL  Basic metabolic panel     Status: Abnormal   Collection Time: 10/27/17  5:00 AM  Result Value Ref Range   Sodium 134 (L) 135 - 145 mmol/L   Potassium 4.5 3.5 - 5.1 mmol/L   Chloride 106 101 - 111 mmol/L   CO2 23 22 - 32 mmol/L   Glucose, Bld 116 (H) 65 - 99 mg/dL   BUN 18 6 - 20 mg/dL   Creatinine, Ser 0.85 0.44 - 1.00 mg/dL   Calcium 7.9 (L) 8.9 - 10.3 mg/dL   GFR calc non Af Amer >60 >60 mL/min   GFR calc Af Amer >60 >60 mL/min    Comment: (NOTE) The eGFR has been calculated using the CKD EPI equation. This calculation has not been validated in all clinical situations. eGFR's persistently <60 mL/min signify possible Chronic Kidney Disease.    Anion gap 5 5 - 15  Glucose,  capillary     Status: Abnormal   Collection Time: 10/27/17  6:19 AM  Result Value Ref Range   Glucose-Capillary 109 (H) 65 - 99 mg/dL   Comment 1 Notify RN    Comment 2 Document in Chart     Studies/Results: Ct Chest W Contrast  Result Date: 10/25/2017 CLINICAL DATA:  71 y/o F; history of paraesophageal hernia repair 09/08/2017 and mediastinal abscess post vats  and drainage 10/25/2017. EXAM: CT CHEST WITH CONTRAST TECHNIQUE: Multidetector CT imaging of the chest was performed during intravenous contrast administration. CONTRAST:  72 cc Isovue-300 COMPARISON:  10/05/2017 CT of the chest. FINDINGS: Cardiovascular: Normal heart size. No pericardial effusion. Left subclavian line extends to the upper SVC. The left PICC line tip extends into the azygos vein. Normal caliber thoracic aorta. Normal caliber main pulmonary artery. Mediastinum/Nodes: Patulous and fluid-filled esophagus as well as large hiatal hernia. The lower esophagus is thick-walled near the gastroesophageal junction likely representing reactive inflammatory changes. Within the posterior inferior mediastinum in the region of the surgical drain to the left and posterior of the herniated gastric cardia and anterior to the descending thoracic aorta there is an ill-defined multiloculated extraluminal fluid collection spanning approximately in conglomerate 3.8 x 2.7 x 3.9 cm likely representing the drained abscess(AP x ML x CC series 3, image 72 and series 5, image 73). Additionally, to the right and posterior of the herniated gastric cardia there is a discrete rim enhancing fluid collection measuring 14 mm likely representing a separate abscess loculation (series 3, image 82). Multiple stable thyroid nodules are present measuring up to 18 mm in the right lobe of the thyroid (series 5, image 33). Lungs/Pleura: Small left and moderate right pleural effusions are present. There is a left-sided chest tube and second left-sided drain traversing the left pleural space into the lower posterior mediastinum. No pneumothorax. Consolidations are present within the dependent lower lobes bilaterally. Upper Abdomen: Multiloculated rim enhancing fluid collections are present under the diaphragm surrounding the spleen and extending into the left pericolic gutter the representing additional abscesses. Musculoskeletal: Multiple chronic  bilateral rib fractures are present. No acute osseous abnormality is identified. IMPRESSION: 1. Ill-defined multiloculated rim enhancing fluid collection and extensive edema in posterior mediastinum inferior to mediastinal drain and anterior to aorta, probably representing the residual of drained abscess. Discrete 14 mm abscess present in the right posterior mediastinum. 2. Multiloculated rim enhancing fluid collection surrounding the spleen and extending into left pericolic gutter, likely abscess. 3. Small bilateral pleural effusions with left pleural drain. 4. Stable dependent lower lobe consolidations may represent atelectasis or pneumonia. Electronically Signed   By: Kristine Garbe M.D.   On: 10/25/2017 22:41    Medications: I have reviewed the patient's current medications.  Assessment: 1.Laparoscopic placement of G-tube and gastropexy with lysis of adhesions on 09/08/17 2.Exploratory laparotomy, takedown of gastrostomy and repair of gastrostomy, washout and placement of mediastinal drains and hepatorrhaphy 2 for moderate volume pneumoperitoneum, puruluent peritonitis, and possibly leakage at G-tube on 09/10/17 3.Left video-assisted thoracoscopy for drainage of mediastinal abscess on 10/13/2017 4.Ill-defined multiloculated collection in the posterior mediastinum, rim enhancing fluid around spleen and left pericolic gutter 5.nausea and vomiting 6.Hematemesis resolved 7.leukocytosis,fever, anemia   Plan: Patient not able to tolerate by mouth diet, gastrostomy tube placement evaluation by IR,may not be possible as most of the gastric cavity is in the chest. Difficult situation, may need prolonged IV TPN to be continued,unless patient's nausea and vomiting is controlled and can start with clear liquid diet.  Nausea vomiting probably further worsened with use of narcotics( patient on fentanyl, oxycodone), antibiotics. Patient receiving Zofran 4 mg every 6 hours when necessary for nausea/  vomiting,along with Phenergan 6.25 mg IV every 6 hours when necessary. Will increase Phenergan 12.5 mg as needed.      Ronnette Juniper 10/27/2017, 8:38 AM   Pager [phone] If no answer or after 5 PM call [phone]

## 2017-10-27 NOTE — Progress Notes (Signed)
PROGRESS NOTE    Danielle [name]  MBW:[id number] DOB: 28-Feb-1946 DOA: 10/27/2017 PCP: Iona Beard, MD Brief Narrative:70 y.o.F w/ a Hxof Crohn's disease, hiatal hernia,psoriasis, and SBO who underwent a type IV hiatal hernia repair in October 2018.Postoperatively the patient required endotracheal intubation, mechanical ventilation and the use of pressors. She was extubated on postop day #2. She improved clinically and was on parenteral nutrition, however she developed a pelvic fluid collection which required a drain. On 09/11/2017 she had blood cultures positive for Candida glabrata, and the pelvic abscess culture was positive for C. Albicans and C. Glabrata. She was given treatment withparenteral antifungals. She subsequently improved, was able to ambulate with physical therapy, weaned off of parenteral nutrition and was tolerating oral diet. ID recommended to continue Invanz andErasisfor 2 weeks. A PICC line was placed in her right arm and she was ultimately discharged home.  She returned on 10/01/2017 due to intermittent fevers. She was treated empirically and clinically improved to the point that she was able to be d/c home again on 11/1.  She returned to the ED 11/8 with complaints of persistent nausea associated with emesis.   The patient was seen and examined at her bedside this morning. She is alert and oriented x 3. She spiked another fever temp 101. Pan cultured and chest xray ordered. levaquin added to zosyn to provide pseudomonas coverage MRSA negative few days ago.  RN reports urinary retention 500 cc after bladder scan. Foley cath inserted. Renal ultrasound ordered. Close monitoring of symptomology.  Palliative care team met with patient and her family today. We appreciate Palliative care team input.  Significant Events: 11/8 readmit 11/11 PICC placed 11/12 thoracentesis 11/5 to OR at [facility] for L VATS for mediastinal drainage    Assessment & Plan:     Principal Problem:   Septic shock ([place]) Active Problems:   Leukocytosis   Perianal Crohn's disease, with fistula ([place])   Acute renal failure superimposed on stage 3 chronic kidney disease ([place])   Incarcerated hiatal hernia s/p reduction 09/08/2017   Immunosuppressed status (HCC)   Hypotension   Nausea and vomiting   S/P thoracentesis   Anasarca   DVT (deep venous thrombosis) (HCC)   Protein-calorie malnutrition, moderate (HCC)   Mediastinal abscess (HCC)   Mediastinal post-op seromas   Pleural effusion, left   Patient's noncompliance with intervention / medical advice    Dehydration   Acute embolism and thrombosis of left axillary vein (HCC)  SevereSepsis/Mediastinal abscess -Persistent fevers: panculture blood cx x 2, urine cx, sputum cx, cxr 10/27/17 reviewed by self left lower lobe small pleural effusion and RML infiltrates vs atelectasis -Esophagram showing 2.5 cm area of extrinsic compression upon the esophagus. Possible smaller abscesses posterior margin spleen  -11/12 S/P LEFTThoracentesis: 780 mL fluid removed: Exudative, Gram stain with WBCs and PMNs -Per Dr. Windy Canny note from 11/9 he reviewed CT scan abdomen pelvis discussed with Dr. Johnny Bridge agree mediastinal abscess complex not amenable to endoscopic drainage. Recommended cardiothoracic surgery be consulted. -IR consulted: Thoracentesis completed. Per review of EMR not candidate for percutaneous drain placement,If drain felt necessary consider tertiary care center. -transferred to [facility] to OR for VATS drainage on 11/15 - immediate post-op care per TCTS, but pt to remain on [place]  - S/P VATS drainage on 11/15. Discussed with and management per TCTS team. ID recommendscontinuing antibiotics and monitor cultures. -Repeat CT scan of the chest done 10/25/2017-1. Ill-defined multiloculated rim enhancing fluid collection and extensive edema in posterior mediastinum inferior to  mediastinal drain and anterior to  aorta, probably representing the residual of drained abscess. Discrete 14 mm abscess present in the right posterior mediastinum. 2. Multiloculated rim enhancing fluid collection surrounding the spleen and extending into left pericolic gutter, likely abscess. 3. Small bilateral pleural effusions with left pleural drain. 4. Stable dependent lower lobe consolidations may represent atelectasis or pneumonia. -CT surgery following. -ID following; we appreciate recommendations  Recurrent high grade fevers -Tmax 103.3 in the last 24 hours -Multiple possible sources -Panculture -empiric treatment, initially IV zosyn and IV levaquin which were switched to meropenem -ID following. We appreciate recommendations  Recent Hematemesis/hemoptysis -stopped IV heparin  -lateral upper extremity DVT 09/2017.  -positive DVT in the left radial vein at this time -no recurrent hematemesis or hemoptysis since yesterday. IV Protonix drip -CT scan of the chest shows evidence of esophageal wall thickening consistent with esophagitis -GI following.  Left upper extremity DVT, unclear if present on admission -management as stated above  Severe dysphagia -refuses any tube feeding -Following with palliative care -speech therapist following  Hypertension Nutrition started on TPN.  Patient with a recent fungemia with candida glabrata and albicans.  I have placed a call to interventional radiology Dr. Kathlene Cote about placing a gastrostomy tube for feeding.  He will be seeing the patient in giving [place] his opinion.  Anemia due to chronic illness  -stable -CBC am  Chronic diastolic CHF  -stable.   DVT prophylaxis: SCDs Code Status:full Family Communication: No family members at bedside Disposition Plan: will stay another midnight to continue broad spectrum IV antibiotics. Consultants: GI,CT SURG,ID Procedures: CHEST TUBE Antimicrobials: Meropenem   Objective:resting in bed.in nad. Vitals:   10/27/17  0153 10/27/17 0300 10/27/17 0400 10/27/17 0433  BP:    (!) 108/59  Pulse:    95  Resp:   20 (!) 22  Temp: 99.8 F (37.7 C) 97.9 F (36.6 C)  98.2 F (36.8 C)  TempSrc: Rectal Rectal  Oral  SpO2:   96% 100%  Weight:      Height:        Intake/Output Summary (Last 24 hours) at 10/27/2017 0718 Last data filed at 10/27/2017 0500 Gross per 24 hour  Intake 2501.83 ml  Output 1420 ml  Net 1081.83 ml   Filed Weights   10/15/17 1449 10/20/17 1600 10/16/2017 0500  Weight: 55.9 kg (123 lb 3.8 oz) 55.3 kg (121 lb 14.6 oz) 55.8 kg (123 lb 0.3 oz)    Examination:  General exam: 71 yo AAF thin built appears uncomfortable due to cooling blanket for high fever Respiratory system: Clear to auscultation. Respiratory effort normal. Left chest tube in place. Cardiovascular system: S1 & S2 heard, RRR. No JVD, murmurs, rubs, gallops or clicks. No pedal edema. Gastrointestinal system: Abdomen is nondistended, soft and nontender. No organomegaly or masses felt. Normal bowel sounds heard. Mid abdomen is covered by gauze no drainage noted. Central nervous system: Alert and oriented x3. No focal neurological deficits. Extremities: Symmetric 5 x 5 power. Skin: No rashes, lesions or ulcers Psychiatry: Judgement and insight appear normal. Mood & affect appropriate.    Data Reviewed: I have personally reviewed following labs and imaging studies  CBC: Recent Labs  Lab 10/23/17 0513 10/24/17 0006 10/25/17 0005 10/25/17 2045 10/26/17 0323  WBC 27.3* 24.5* 24.9* 31.8* 23.9*  NEUTROABS  --   --  18.2*  --  17.0*  HGB 11.3* 11.3* 9.6* 10.5* 9.2*  HCT 35.7* 34.5* 29.5* 32.0* 28.4*  MCV 84.2 84.1 84.0 83.3 84.3  PLT 327 300 274 291 970   Basic Metabolic Panel: Recent Labs  Lab 10/07/2017 0321 10/22/17 0400 10/23/17 0513 10/24/17 0006 10/25/17 0005 10/26/17 0323  NA 134* 135 135 135 135 135  K 3.4* 3.6 3.9 4.2 3.5 3.8  CL 109 111 109 109 109 106  CO2 19* 18* 18* 18* 20* 21*  GLUCOSE 119* 205*  85 91 124* 114*  BUN 11 13 16 16 15 16   CREATININE 0.87 0.77 0.80 0.88 0.84 0.86  CALCIUM 7.5* 8.0* 7.7* 7.9* 7.7* 7.9*  MG 1.8 1.6* 2.1 2.0 1.9  --   PHOS 2.3* 3.0 3.2  --  3.4  --    GFR: Estimated Creatinine Clearance: 54.4 mL/min (by C-G formula based on SCr of 0.86 mg/dL). Liver Function Tests: Recent Labs  Lab 10/20/2017 0321 10/23/17 0513 10/25/17 0005  AST 60* 28 32  ALT 37 27 28  ALKPHOS 112 111 145*  BILITOT 0.8 0.4 0.3  PROT 6.0* 6.9 6.0*  ALBUMIN 1.5* 1.6* 1.5*   No results for input(s): LIPASE, AMYLASE in the last 168 hours. No results for input(s): AMMONIA in the last 168 hours. Coagulation Profile: No results for input(s): INR, PROTIME in the last 168 hours. Cardiac Enzymes: No results for input(s): CKTOTAL, CKMB, CKMBINDEX, TROPONINI in the last 168 hours. BNP (last 3 results) No results for input(s): PROBNP in the last 8760 hours. HbA1C: No results for input(s): HGBA1C in the last 72 hours. CBG: Recent Labs  Lab 10/25/17 2119 10/26/17 0548 10/26/17 1513 10/26/17 2108 10/27/17 0619  GLUCAP 117* 107* 55* 64* 109*   Lipid Profile: Recent Labs    10/25/17 0333  TRIG 83   Thyroid Function Tests: No results for input(s): TSH, T4TOTAL, FREET4, T3FREE, THYROIDAB in the last 72 hours. Anemia Panel: No results for input(s): VITAMINB12, FOLATE, FERRITIN, TIBC, IRON, RETICCTPCT in the last 72 hours. Sepsis Labs: Recent Labs  Lab 10/26/17 0323  LATICACIDVEN 1.0    Recent Results (from the past 240 hour(s))  Culture, fungus without smear     Status: None (Preliminary result)   Collection Time: 10/18/17  3:19 PM  Result Value Ref Range Status   Specimen Description Pleural, L  Final   Special Requests NONE  Final   Culture   Final    NO FUNGUS ISOLATED AFTER 7 DAYS Performed at [facility], [place] [address] [place], [place] [address]    Report Status PENDING  Incomplete  Culture, body fluid-bottle     Status: None   Collection Time:  10/18/17  3:19 PM  Result Value Ref Range Status   Specimen Description FLUID LEFT PLEURAL  Final   Special Requests BOTTLES DRAWN AEROBIC AND ANAEROBIC 10CC  Final   Culture   Final    NO GROWTH 5 DAYS Performed at [facility], [place] [address], [place], [place] [address]    Report Status 10/23/2017 FINAL  Final  Gram stain     Status: None   Collection Time: 10/18/17  3:19 PM  Result Value Ref Range Status   Specimen Description FLUID LEFT PLEURAL  Final   Special Requests NONE  Final   Gram Stain   Final    ABUNDANT WBC PRESENT,BOTH PMN AND MONONUCLEAR NO ORGANISMS SEEN Performed at [facility], [place] [address] [place], [place] [address]    Report Status 10/18/2017 FINAL  Final  Culture, blood (routine x 2)     Status: None   Collection Time: 10/19/17  9:45 AM  Result Value Ref Range Status   Specimen Description BLOOD BLOOD RIGHT FOREARM  Final   Special Requests IN PEDIATRIC BOTTLE Blood Culture adequate volume  Final   Culture   Final    NO GROWTH 5 DAYS Performed at [facility], [place] [address] [place], [place] [address]    Report Status 10/24/2017 FINAL  Final  Culture, blood (routine x 2)     Status: None   Collection Time: 10/19/17  9:48 AM  Result Value Ref Range Status   Specimen Description BLOOD RIGHT HAND  Final   Special Requests IN PEDIATRIC BOTTLE Blood Culture adequate volume  Final   Culture   Final    NO GROWTH 5 DAYS Performed at [facility], [place] [address] [place], [place] [address]    Report Status 10/24/2017 FINAL  Final  Culture, Urine     Status: None   Collection Time: 10/20/17  5:06 PM  Result Value Ref Range Status   Specimen Description URINE, CATHETERIZED  Final   Special Requests NONE  Final   Culture   Final    NO GROWTH Performed at [facility], [place] [address] [place], [place] [address]    Report Status 10/22/2017 FINAL  Final  Surgical PCR screen     Status: None   Collection Time: 10/13/2017  3:21  AM  Result Value Ref Range Status   MRSA, PCR NEGATIVE NEGATIVE Final   Staphylococcus aureus NEGATIVE NEGATIVE Final    Comment: (NOTE) The Xpert SA Assay (FDA approved for NASAL specimens in patients 2 years of age and older), is one component of a comprehensive surveillance program. It is not intended to diagnose infection nor to guide or monitor treatment.   Body fluid culture     Status: None   Collection Time: 10/12/2017  9:02 AM  Result Value Ref Range Status   Specimen Description FLUID  Final   Special Requests MEDISTINAL PATIENT ON FOLLOWING ZOSYN AND VANC  Final   Gram Stain   Final    MODERATE WBC PRESENT,BOTH PMN AND MONONUCLEAR NO ORGANISMS SEEN    Culture NO GROWTH 3 DAYS  Final   Report Status 10/24/2017 FINAL  Final  Body fluid culture     Status: None   Collection Time: 10/28/2017  9:02 AM  Result Value Ref Range Status   Specimen Description FLUID PLEURAL LEFT  Final   Special Requests PATIENT ON FOLLOWING ZOSYN AND VANC  Final   Gram Stain   Final    MODERATE WBC PRESENT, PREDOMINANTLY MONONUCLEAR NO ORGANISMS SEEN    Culture NO GROWTH 3 DAYS  Final   Report Status 10/24/2017 FINAL  Final  Fungus Culture With Stain     Status: None (Preliminary result)   Collection Time: 10/09/2017  9:02 AM  Result Value Ref Range Status   Fungus Stain Final report  Final    Comment: (NOTE) Performed At: [facility] [place], [place] [id number] Rush Farmer MD IA:[id number]    Fungus (Mycology) Culture PENDING  Incomplete   Fungal Source FLUID  Final    Comment: PLEURAL LEFT PATIENT ON FOLLOWING ZOSYN AND VANC   Acid Fast Smear (AFB)     Status: None   Collection Time: 11/03/2017  9:02 AM  Result Value Ref Range Status   AFB Specimen Processing Concentration  Final   Acid Fast Smear Negative  Final    Comment: (NOTE) Performed At: [facility]  [place], [place] [id number] Rush Farmer MD [id number]    Source  (AFB) FLUID  Final    Comment: PLEURAL LEFT PATIENT ON FOLLOWING ZOSYN AND VANC   Fungus Culture Result     Status: None   Collection Time: 10/09/2017  9:02 AM  Result Value Ref Range Status   Result 1 Comment  Final    Comment: (NOTE) KOH/Calcofluor preparation:  no fungus observed. Performed At: [facility] [place], [place] [id number] Rush Farmer MD VH:[id number]   Fungus Culture With Stain     Status: None (Preliminary result)   Collection Time: 11/04/2017  9:42 AM  Result Value Ref Range Status   Fungus Stain Final report  Final    Comment: (NOTE) Performed At: [facility] [place], [place] [id number] Rush Farmer MD [id number]    Fungus (Mycology) Culture PENDING  Incomplete   Fungal Source FLUID  Final    Comment: MEDISTINAL PATIENT ON FOLLOWING ZOSYN AND VANC    Acid Fast Smear (AFB)     Status: None   Collection Time: 10/08/2017  9:42 AM  Result Value Ref Range Status   AFB Specimen Processing Concentration  Final   Acid Fast Smear Negative  Final    Comment: (NOTE) Performed At: [facility] [place], [place] [id number] Rush Farmer MD VF:[id number]    Source (AFB) FLUID  Final    Comment: MEDISTINAL PATIENT ON FOLLOWING ZOSYN AND VANC   Fungus Culture Result     Status: None   Collection Time: 10/17/2017  9:42 AM  Result Value Ref Range Status   Result 1 Comment  Final    Comment: (NOTE) KOH/Calcofluor preparation:  no fungus observed. Performed At: [facility] [place], [place] [id number] Rush Farmer MD [id number]   Culture, blood (routine x 2)     Status: None (Preliminary result)   Collection Time: 10/24/17 12:06 AM  Result Value Ref Range Status   Specimen Description BLOOD LEFT FOOT  Final   Special Requests   Final    BOTTLES DRAWN AEROBIC AND ANAEROBIC Blood Culture adequate volume   Culture NO GROWTH 2 DAYS  Final   Report Status PENDING   Incomplete  Culture, blood (routine x 2)     Status: None (Preliminary result)   Collection Time: 10/24/17 12:16 AM  Result Value Ref Range Status   Specimen Description BLOOD RIGHT FOOT  Final   Special Requests   Final    BOTTLES DRAWN AEROBIC ONLY Blood Culture results may not be optimal due to an inadequate volume of blood received in culture bottles   Culture NO GROWTH 2 DAYS  Final   Report Status PENDING  Incomplete  Fungus culture, blood     Status: None (Preliminary result)   Collection Time: 10/24/17 12:16 AM  Result Value Ref Range Status   Specimen Description BLOOD LEFT FOOT  Final   Special Requests   Final    BOTTLES DRAWN AEROBIC AND ANAEROBIC Blood Culture adequate volume   Culture NO GROWTH 2 DAYS  Final   Report Status PENDING  Incomplete  Culture, blood (routine x 2)     Status: None (Preliminary result)   Collection Time: 10/24/17  3:50 PM  Result Value Ref Range Status   Specimen Description BLOOD RIGHT FOOT  Final   Special Requests IN PEDIATRIC BOTTLE Blood Culture adequate volume  Final   Culture NO GROWTH 2 DAYS  Final   Report  Status PENDING  Incomplete         Radiology Studies: Ct Chest W Contrast  Result Date: 10/25/2017 CLINICAL DATA:  71 y/o F; history of paraesophageal hernia repair 09/08/2017 and mediastinal abscess post vats and drainage 10/27/2017. EXAM: CT CHEST WITH CONTRAST TECHNIQUE: Multidetector CT imaging of the chest was performed during intravenous contrast administration. CONTRAST:  72 cc Isovue-300 COMPARISON:  10/05/2017 CT of the chest. FINDINGS: Cardiovascular: Normal heart size. No pericardial effusion. Left subclavian line extends to the upper SVC. The left PICC line tip extends into the azygos vein. Normal caliber thoracic aorta. Normal caliber main pulmonary artery. Mediastinum/Nodes: Patulous and fluid-filled esophagus as well as large hiatal hernia. The lower esophagus is thick-walled near the gastroesophageal junction likely  representing reactive inflammatory changes. Within the posterior inferior mediastinum in the region of the surgical drain to the left and posterior of the herniated gastric cardia and anterior to the descending thoracic aorta there is an ill-defined multiloculated extraluminal fluid collection spanning approximately in conglomerate 3.8 x 2.7 x 3.9 cm likely representing the drained abscess(AP x ML x CC series 3, image 72 and series 5, image 73). Additionally, to the right and posterior of the herniated gastric cardia there is a discrete rim enhancing fluid collection measuring 14 mm likely representing a separate abscess loculation (series 3, image 82). Multiple stable thyroid nodules are present measuring up to 18 mm in the right lobe of the thyroid (series 5, image 33). Lungs/Pleura: Small left and moderate right pleural effusions are present. There is a left-sided chest tube and second left-sided drain traversing the left pleural space into the lower posterior mediastinum. No pneumothorax. Consolidations are present within the dependent lower lobes bilaterally. Upper Abdomen: Multiloculated rim enhancing fluid collections are present under the diaphragm surrounding the spleen and extending into the left pericolic gutter the representing additional abscesses. Musculoskeletal: Multiple chronic bilateral rib fractures are present. No acute osseous abnormality is identified. IMPRESSION: 1. Ill-defined multiloculated rim enhancing fluid collection and extensive edema in posterior mediastinum inferior to mediastinal drain and anterior to aorta, probably representing the residual of drained abscess. Discrete 14 mm abscess present in the right posterior mediastinum. 2. Multiloculated rim enhancing fluid collection surrounding the spleen and extending into left pericolic gutter, likely abscess. 3. Small bilateral pleural effusions with left pleural drain. 4. Stable dependent lower lobe consolidations may represent  atelectasis or pneumonia. Electronically Signed   By: Kristine Garbe M.D.   On: 10/25/2017 22:41        Scheduled Meds: . acetaminophen  1,000 mg Oral Q6H   Or  . acetaminophen (TYLENOL) oral liquid 160 mg/5 mL  1,000 mg Oral Q6H  . bisacodyl  10 mg Oral Daily  . fentaNYL   Intravenous Q4H  . insulin aspart  0-3 Units Subcutaneous 3 times per day  . lip balm  1 application Topical BID  . [START ON 10/29/2017] pantoprazole  40 mg Intravenous Q12H  . senna-docusate  1 tablet Oral QHS   Continuous Infusions: . anidulafungin Stopped (10/26/17 1905)  . pantoprozole (PROTONIX) infusion 8 mg/hr (10/27/17 0048)  . piperacillin-tazobactam (ZOSYN)  IV 3.375 g (10/27/17 0557)  . potassium chloride Stopped (10/22/17 1145)  . TPN CYCLIC-ADULT (ION) 267 mL/hr at 10/26/17 1838     LOS: 13 days      Kayleen Memos, MD [facility]  If 7PM-7AM, please contact night-coverage [email or web address] Password [facility] 10/27/2017, 7:18 AM

## 2017-10-27 NOTE — Progress Notes (Signed)
[place] NOTE   Pharmacy Consult for TPN Indication: Poor PO intake / unable to place repeat G-tube   Patient Measurements: Height: 5' 4.5" (163.8 cm) Weight: 123 lb 0.3 oz (55.8 kg) IBW/kg (Calculated) : 55.85 TPN AdjBW (KG): 59 Body mass index is 20.79 kg/m.  Assessment:  66 yoF re-admit 11/8 with N/V, loose stools (baseline w/Crohn's). Was hospitalized for a month in October and developed fungemia and treated with Invanz and anidulafungin. Re-admitted a week later with intermittent fevers and difficultly swallowing. CT chest showed recurrent mediastinal fluid abscess.  PMH: Crohns, HH, hx SBO  Significant events:  11/15 Left VATs to drain mediastinum   GI: Mediastinal abscess complex, has been advanced to full liquid diet but has nausea and vomiting with almost anything PO. Had 4 episodes of emesis earlier this week but volume not charted. Patient has poor appetite and still experiencing nausea with any type of PO intake. GI recommending possible Gtube placement but PO intake would be best. Patient also refusing repeat CT scan. [place] meeting to take place on 11/21. Albumin low at 1.5. Last BM was 11/19. Endo: CBGs controled (50-110s) on SSI. Not symptomatic with lows. Has not needed insulin. Will need to back off cycle to hopefully help with hypoglycemic events. Insulin requirements in the past 24 hours: 0 unit regular insulin  Lytes: wnl on 11/20. CO2 up to 21. CoCa 9.9. Renal: SCr stable, UOP low. Net positive this admit but I/Os not all charted. Pulm: RA; Bilateral chest tube output at 40 mL / 24 hours, chest tubes to remain in place for several days  Cards: BP mostly controlled, HR 80-110s Hepatobil: AST/ALT and Tbili wnl. Trig 83 ID: Continues on Zosyn and anidulafungin for mediastinal abscess. Tmax of 103.3 yesteday, WBC down to 23.9. Drain in place. Cultures negative so far  Best Practices: SCDs TPN Access: PICC 11/11>> TPN start  date: 11/11>>  Nutritional Goals (per RD recommendation on 11/19): Kcal:  1500-1700 Protein:  75-90 grams  Fluid:  >/= 1.5 L/day  Current Nutrition:  Clear liquid (appetite poor) Custom TPN  Plan:  Change cyclic TPN back to 18 hr cycle. Infuse 1,680 mL over 18 hrs : 49 mL/hr x 1 hr, then 99 mL/hr x 14 hrs, then 49 mL/hr x 1 hr.  TPN provides 84 g of protein, 235 g of dextrose, and 50 g lipids providing 1,640 total kcal, which meets ~100% of patient needs.  Electrolytes in TPN: Max acetate AddMVI,trace elements in TPN Stop SSI but continue CBG checks for hypoglycemic events Monitor TPN labs, clinical progress  F/U ability to tolerate PO diet, GOC meeting today   Elenor Quinones, PharmD, [facility] Clinical Pharmacist Pager [phone] 10/27/2017 7:26 AM

## 2017-10-27 NOTE — Progress Notes (Addendum)
Patient refusing to have blood cultures drawn at this time, paged MD.   MD returned page, said it was OK to have labs drawn in hands.

## 2017-10-27 NOTE — Progress Notes (Signed)
6 Days Post-Op Procedure(s) (LRB): LEFT VIDEO ASSISTED THORACOSCOPY (VATS) for MEDISTINAL DRAINAGE (Left) Subjective: Does not feel well this AM Fever to 103 last night  Objective: Vital signs in last 24 hours: Temp:  [97.9 F (36.6 C)-103.3 F (39.6 C)] 100.7 F (38.2 C) (11/21 0822) Pulse Rate:  [95-116] 105 (11/21 0822) Cardiac Rhythm: Normal sinus rhythm (11/21 0700) Resp:  [20-36] 30 (11/21 0822) BP: (108-185)/(59-91) 130/64 (11/21 0700) SpO2:  [96 %-100 %] 99 % (11/21 0700)  Hemodynamic parameters for last 24 hours:    Intake/Output from previous day: 11/20 0701 - 11/21 0700 In: 2501.8 [P.O.:120; I.V.:1921.8; IV Piggyback:460] Out: 1751 [Urine:400; Stool:1000; Chest Tube:20] Intake/Output this shift: No intake/output data recorded.  General appearance: ill appearing Heart: tachy, regular Lungs: diminished breath sounds bibasilar minimal serosanguinous drainage from tube  Lab Results: Recent Labs    10/26/17 0323 10/27/17 0500  WBC 23.9* 19.5*  HGB 9.2* 8.9*  HCT 28.4* 28.1*  PLT 253 266   BMET:  Recent Labs    10/26/17 0323 10/27/17 0500  NA 135 134*  K 3.8 4.5  CL 106 106  CO2 21* 23  GLUCOSE 114* 116*  BUN 16 18  CREATININE 0.86 0.85  CALCIUM 7.9* 7.9*    PT/INR: No results for input(s): LABPROT, INR in the last 72 hours. ABG    Component Value Date/Time   PHART 7.317 (L) 10/22/2017 0420   HCO3 17.9 (L) 10/22/2017 0420   TCO2 15 (L) 09/10/2017 1214   ACIDBASEDEF 7.1 (H) 10/22/2017 0420   O2SAT 96.1 10/22/2017 0420   CBG (last 3)  Recent Labs    10/26/17 1513 10/26/17 2108 10/27/17 0619  GLUCAP 55* 64* 109*    Assessment/Plan: S/P Procedure(s) (LRB): LEFT VIDEO ASSISTED THORACOSCOPY (VATS) for MEDISTINAL DRAINAGE (Left) -Continues to spike fevers On Zosyn and Eraxis WBC trending down Mediastinal drain with minimal drainage but in good position Unable to tolerate PO yesterday    LOS: 13 days    Melrose Nakayama 10/27/2017

## 2017-10-27 NOTE — Consult Note (Signed)
Consultation Note Date: 10/27/2017   Patient Name: Danielle [name]  DOB: 02-17-1946  MRN: [id number]  Age / Sex: 71 y.o., female  PCP: Iona Beard, MD Referring Physician: Kayleen Memos, DO  Reason for Consultation: Establishing goals of care  HPI/Patient Profile: 71 y.o. female  with past medical history of Crohn's disease, incarcerated hiatal hernia, CKD stage 3, diastolic heart failure, SBO, left buttock abscess, psoriasis, colostomy with take down ~1 month 2009. Admitted on 10/26/2017 with nausea and vomiting. Has had complicated history with hernia operation 73/4 and stay complicated by abd pain and G tube leaking requiring reoperation, purulent peritonitis, requiring intubation and vasopressors, ileus, fungemia, pelvic fluid collection drained 10/3-10/21 but discharged to home being able to walk and tolerated diet. Readmitted 10/26-11/1 with fever felt to be related to dehydration and post infection process followed by ID and with RUE DVT. Currently ID following for treatment of infection and monitoring blood cultures and fluid collections. Had thoracentesis 11/12 and VATs for mediastinum abscess 11/15. She is severely malnourished (on TPN) and deconditioned with poorly controlled infection with continued high fevers and nausea. Recommendation to stop TPN to better control infection but intake is very poor. IR consulted and request CT abd to assess for feeding tube placement. Palliative requested to assist with GOC.   Clinical Assessment and Goals of Care: I met today with Danielle [name] along with her sister, Cheri Rous, at bedside. Danielle [name] is exhausted and had a bad night last night with nausea and fever 103. She is exhausted but not currently nauseated. They were both very clear that their intentions are for Danielle [name] to continue to fight and "to get better." They tell me that their goal is to have her back  home by Dec 18th (her birthday). We discussed the barriers of improvement with overwhelming infection, deconditioning, and also with nutrition. We discussed feeding tube. Danielle [name] is adamant that she does not desire feeding tube and not interested in another CT scan. They both desire to continue trials of oral intake and understand that the TPN must be discontinued. I attempted to voice my concern with her ability to maintain adequate nutrition with her nausea especially. They both maintain that she will work to be able to take in nutrition. We discussed plan to better control nausea and has cooling blanket on for fever. Emotional support provided.   Late entry: I came back to speak with Danielle [name] alone. She appears more alert and willing to talk. 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 She has no children but multiple nieces/nephews, brother, and sister Cheri Rous who she says they have lived together most of their lives and she relies on her heavily. Danielle [name] stands firm on her goal that "I'm not giving up" and NO feeding tube. Refuses to entertain idea of PEG or Cortrak. She does desire to be full code and continue with aggressive care otherwise. Again reiterated that this may be  problematic as her body can only get stronger with adequate nutrition. She does tell me her nausea is better controlled today.   Primary Decision Maker PATIENT    SUMMARY OF RECOMMENDATIONS   - Continue aggressive care but refuses feeding tube - Hopeful for improvement  Code Status/Advance Care Planning:  Full code   Symptom Management:   Nausea/vomiting: Zofran gives her headaches -try and avoid. Phenergan scheduled TID with meals 6.25 mg IV. Phenergan 6.25 mg IV every 6 hours prn. Monitor QTc and call/hold antiemetics for QTc >0.42.   Malnutrition: Plans to d/c TPN. Danielle [name] only likes berry Breeze and avoiding red colored  liquids to monitor bleeding. Agree to continue clear liquids. Spoke with nutrition about Ensure clear and we do not stock in hospital but Cheri Rous will bring some tomorrow.   Palliative Prophylaxis:   Aspiration, Bowel Regimen, Delirium Protocol, Frequent Pain Assessment, Oral Care, Palliative Wound Care and Turn Reposition  Additional Recommendations (Limitations, Scope, Preferences):  Full Scope Treatment  Psycho-social/Spiritual:   Desire for further Chaplaincy support:yes  Additional Recommendations: Caregiving  Support/Resources  Prognosis:   Very poor with severe deconditioning and extensive infection burden.   Discharge Planning: To Be Determined      Primary Diagnoses: Present on Admission: . Nausea and vomiting . Leukocytosis . DVT (deep venous thrombosis) ([place]) . Hypotension . Perianal Crohn's disease, with fistula ([place]) . Incarcerated hiatal hernia s/p reduction 09/08/2017 . Immunosuppressed status ([place]) . Acute renal failure superimposed on stage 3 chronic kidney disease ([place]) . Protein-calorie malnutrition, moderate ([place]) . Anasarca   I have reviewed the medical record, interviewed the patient and family, and examined the patient. The following aspects are pertinent.  Past Medical History:  Diagnosis Date  . Crohn disease ([place])   . Hiatal hernia 12/07/2015  . Left buttock abscess 04/21/2017  . Psoriasis   . SBO (small bowel obstruction) ([place]) 07/27/2017   Social History   Socioeconomic History  . Marital status: Single    Spouse name: None  . Number of children: None  . Years of education: None  . Highest education level: None  Social Needs  . Financial resource strain: None  . Food insecurity - worry: None  . Food insecurity - inability: None  . Transportation needs - medical: None  . Transportation needs - non-medical: None  Occupational History  . None  Tobacco Use  . Smoking status: Former Smoker    Packs/day: 0.12    Years: 4.00     Pack years: 0.48    Types: Cigarettes    Last attempt to quit: 10/05/1996    Years since quitting: 21.0  . Smokeless tobacco: Never Used  . Tobacco comment: 08/22/2013 1 pack every two weeks when she did smoked  Substance and Sexual Activity  . Alcohol use: No    Alcohol/week: 0.0 oz  . Drug use: No  . Sexual activity: Not Currently    Birth control/protection: Post-menopausal, Implant  Other Topics Concern  . None  Social History Narrative   Lives in [place].  Moved from [place] in 2008.  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  LIves with her sister.   No home health services.     Family History  Problem Relation Age of Onset  . Diabetes Mother   . Heart disease Father   . Healthy Sister   . Hypertension Brother   . Colon cancer Brother        died with colon cancer  . Crohn's disease Paternal  Aunt    Scheduled Meds: . acetaminophen  1,000 mg Oral Q6H   Or  . acetaminophen (TYLENOL) oral liquid 160 mg/5 mL  1,000 mg Oral Q6H  . bisacodyl  10 mg Oral Daily  . fentaNYL   Intravenous Q4H  . lip balm  1 application Topical BID  . [START ON 10/29/2017] pantoprazole  40 mg Intravenous Q12H  . senna-docusate  1 tablet Oral QHS   Continuous Infusions: . anidulafungin Stopped (10/26/17 1905)  . pantoprozole (PROTONIX) infusion 8 mg/hr (10/27/17 0048)  . piperacillin-tazobactam (ZOSYN)  IV 3.375 g (10/27/17 0557)  . potassium chloride Stopped (10/22/17 1145)  . TPN CYCLIC-ADULT (ION) 465 mL/hr at 10/26/17 1838   PRN Meds:.diphenhydrAMINE **OR** diphenhydrAMINE, fentaNYL (SUBLIMAZE) injection, hydrocortisone, naloxone **AND** sodium chloride flush, nystatin, ondansetron (ZOFRAN) IV, oxyCODONE, phenol, potassium chloride, promethazine, traMADol Allergies  Allergen Reactions  . Zithromax [Azithromycin] Swelling and Other (See Comments)    Mouth swelling and blisters   . Aspirin Other (See Comments)    Stomach cramping   . Ibuprofen Other (See Comments)    Pt states that she prefers not to  take this medication.   . Iron Nausea And Vomiting and Other (See Comments)    FERROUS SULFATE  . Sulfa Antibiotics Nausea And Vomiting   Review of Systems  Constitutional: Positive for activity change, appetite change, fatigue and fever.  Respiratory: Negative for shortness of breath.   Gastrointestinal: Positive for nausea and vomiting.  Psychiatric/Behavioral: Positive for sleep disturbance.    Physical Exam  Constitutional: She is oriented to person, place, and time. She has a sickly appearance.  Frail, malnourished  HENT:  Head: Normocephalic and atraumatic.  Cardiovascular: Regular rhythm. Tachycardia present.  Pulmonary/Chest: Effort normal. No accessory muscle usage. No tachypnea. No respiratory distress. She has decreased breath sounds in the left lower field.  Lt CT in place  Abdominal: Soft. Normal appearance.  Neurological: She is alert and oriented to person, place, and time.  Psychiatric:  Tired, frustrated  Nursing note and vitals reviewed.   Vital Signs: BP (!) 108/59 (BP Location: Right Leg)   Pulse 95   Temp 98.2 F (36.8 C) (Oral)   Resp (!) 22   Ht 5' 4.5" (1.638 m)   Wt 55.8 kg (123 lb 0.3 oz)   SpO2 100%   BMI 20.79 kg/m  Pain Assessment: 0-10 POSS *See Group Information*: S-Acceptable,Sleep, easy to arouse Pain Score: Asleep   SpO2: SpO2: 100 % O2 Device:SpO2: 100 % O2 Flow Rate: .O2 Flow Rate (L/min): 2 L/min  IO: Intake/output summary:   Intake/Output Summary (Last 24 hours) at 10/27/2017 0758 Last data filed at 10/27/2017 0500 Gross per 24 hour  Intake 2501.83 ml  Output 1420 ml  Net 1081.83 ml    LBM: Last BM Date: 10/25/17 Baseline Weight: Weight: 59 kg (130 lb) Most recent weight: Weight: 55.8 kg (123 lb 0.3 oz)     Palliative Assessment/Data: 20%    Time Total: 85 min  Greater than 50%  of this time was spent counseling and coordinating care related to the above assessment and plan.  Signed by: Vinie Sill,  NP Palliative Medicine Team Pager # [phone] (M-F 8a-5p) Team Phone # [phone] (Nights/Weekends)

## 2017-10-27 NOTE — Progress Notes (Signed)
6 Days Post-Op   Subjective/Chief Complaint: Not able to tol PO.  States breeze and pain medicine came at the same time. Fever this AM Denies abd pain   Objective: Vital signs in last 24 hours: Temp:  [97.9 F (36.6 C)-103.3 F (39.6 C)] 100.3 F (37.9 C) (11/21 1957) Pulse Rate:  [95-116] 98 (11/21 1548) Resp:  [20-36] 35 (11/21 1634) BP: (108-165)/(53-82) 116/62 (11/21 1548) SpO2:  [95 %-100 %] 95 % (11/21 1957) Last BM Date: 10/27/17  Intake/Output from previous day: 11/20 0701 - 11/21 0700 In: 2501.8 [P.O.:120; I.V.:1921.8; IV Piggyback:460] Out: 1856 [Urine:400; Stool:1000; Chest Tube:20] Intake/Output this shift: Total I/O In: 987.9 [I.V.:657.9; IV Piggyback:330] Out: 1120 [Urine:1120]  General appearance: alert and cooperative GI: soft, nttp, nd  Lab Results:  Recent Labs    10/26/17 0323 10/27/17 0500  WBC 23.9* 19.5*  HGB 9.2* 8.9*  HCT 28.4* 28.1*  PLT 253 266   BMET Recent Labs    10/26/17 0323 10/27/17 0500  NA 135 134*  K 3.8 4.5  CL 106 106  CO2 21* 23  GLUCOSE 114* 116*  BUN 16 18  CREATININE 0.86 0.85  CALCIUM 7.9* 7.9*   PT/INR No results for input(s): LABPROT, INR in the last 72 hours. ABG No results for input(s): PHART, HCO3 in the last 72 hours.  Invalid input(s): PCO2, PO2  Studies/Results: Dg Chest Port 1 View  Result Date: 10/27/2017 CLINICAL DATA:  Fever EXAM: PORTABLE CHEST 1 VIEW COMPARISON:  10/24/2017 FINDINGS: Left dialysis catheter and PICC line remain in place, unchanged. PICC line courses superiorly, unchanged. Interval removal of left chest tube. No pneumothorax. Mild cardiomegaly. Bilateral perihilar and lower lobe opacities, similar to prior study. IMPRESSION: Interval removal of left chest tube without pneumothorax. Otherwise no significant change. Electronically Signed   By: Rolm Baptise M.D.   On: 10/27/2017 09:45    Anti-infectives: Anti-infectives (From admission, onward)   Start     Dose/Rate Route  Frequency Ordered Stop   10/27/17 1400  meropenem (MERREM) 1 g in sodium chloride 0.9 % 100 mL IVPB     1 g 200 mL/hr over 30 Minutes Intravenous Every 8 hours 10/27/17 1245     10/27/17 1100  levofloxacin (LEVAQUIN) IVPB 750 mg  Status:  Discontinued     750 mg 100 mL/hr over 90 Minutes Intravenous Every 24 hours 10/27/17 1041 10/27/17 1218   10/27/17 0400  piperacillin-tazobactam (ZOSYN) IVPB 3.375 g  Status:  Discontinued     3.375 g 12.5 mL/hr over 240 Minutes Intravenous Every 8 hours 10/26/17 2201 10/27/17 1222   10/22/17 0600  vancomycin (VANCOCIN) IVPB 1000 mg/200 mL premix  Status:  Discontinued     1,000 mg 200 mL/hr over 60 Minutes Intravenous To Short Stay 11/02/2017 1631 10/22/2017 1637   10/20/2017 2100  vancomycin (VANCOCIN) IVPB 1000 mg/200 mL premix     1,000 mg 200 mL/hr over 60 Minutes Intravenous Every 12 hours 10/16/2017 1631 10/08/2017 2246   11/04/2017 0900  vancomycin (VANCOCIN) IVPB 1000 mg/200 mL premix     1,000 mg 200 mL/hr over 60 Minutes Intravenous To Surgery 10/23/2017 0857 10/20/2017 0957   10/17/17 0800  vancomycin (VANCOCIN) IVPB 750 mg/150 ml premix  Status:  Discontinued     750 mg 150 mL/hr over 60 Minutes Intravenous Every 24 hours 10/16/17 1220 10/17/17 1023   10/16/17 1800  anidulafungin (ERAXIS) 100 mg in sodium chloride 0.9 % 100 mL IVPB     100 mg 78 mL/hr over 100  Minutes Intravenous Every 24 hours 10/15/17 1657     10/16/17 0600  vancomycin (VANCOCIN) IVPB 1000 mg/200 mL premix  Status:  Discontinued     1,000 mg 200 mL/hr over 60 Minutes Intravenous Every 36 hours 10/25/2017 2308 10/16/17 1220   10/15/17 1800  anidulafungin (ERAXIS) 200 mg in sodium chloride 0.9 % 200 mL IVPB     200 mg 78 mL/hr over 200 Minutes Intravenous  Once 10/15/17 1657 10/15/17 2148   10/27/2017 2330  piperacillin-tazobactam (ZOSYN) IVPB 3.375 g  Status:  Discontinued     3.375 g 100 mL/hr over 30 Minutes Intravenous Every 8 hours 10/09/2017 2257 10/11/2017 2259   10/16/2017 2315   piperacillin-tazobactam (ZOSYN) IVPB 3.375 g  Status:  Discontinued     3.375 g 12.5 mL/hr over 240 Minutes Intravenous Every 8 hours 10/23/2017 2300 10/26/17 2201   10/29/2017 1930  vancomycin (VANCOCIN) IVPB 1000 mg/200 mL premix     1,000 mg 200 mL/hr over 60 Minutes Intravenous  Once 10/13/2017 1915 10/10/2017 2044   10/23/2017 1530  piperacillin-tazobactam (ZOSYN) IVPB 3.375 g     3.375 g 100 mL/hr over 30 Minutes Intravenous  Once 11/04/2017 1529 10/22/2017 1604      Assessment/Plan: s/p Procedure(s): LEFT VIDEO ASSISTED THORACOSCOPY (VATS) for MEDISTINAL DRAINAGE (Left) Difficult situation.  Would attempt to trial PO around pain rx.  She was taking some PO to suppl her TNA prior to mediastinal drainage.  Doubtful Gtube would be stable, even if she agreed to it.  She has been at home with TNA and did well. Severely deconditioned.    LOS: 13 days    [name]., Anne Hahn 10/27/2017

## 2017-10-27 NOTE — Progress Notes (Signed)
Notified MD of increased temp 100.4. Patient on cooling blanket at this time, refusing PO meds.

## 2017-10-27 NOTE — Progress Notes (Addendum)
Pharmacy Antibiotic Note  Danielle [name] is a 71 y.o. female admitted on 10/22/2017 with sepsis.  Pharmacy has been consulted for Levaquin dosing. Patient is currently on IV zosyn and anidulafungin yet spiking fevers. Tm 103.3. WBC remains elevated at 19.5 but is trending down. SCr wnl.   Plan: -Start Levaquin 750 mg IV Q 24 hours -Continue Zosyn and anidulafungin per MD -Monitor CBC, fever curve and clinical progress  -F/u cultures results   Height: 5' 4.5" (163.8 cm) Weight: 123 lb 0.3 oz (55.8 kg) IBW/kg (Calculated) : 55.85  Temp (24hrs), Avg:100.4 F (38 C), Min:97.9 F (36.6 C), Max:103.3 F (39.6 C)  Recent Labs  Lab 10/23/17 0513 10/24/17 0006 10/25/17 0005 10/25/17 2045 10/26/17 0323 10/27/17 0500  WBC 27.3* 24.5* 24.9* 31.8* 23.9* 19.5*  CREATININE 0.80 0.88 0.84  --  0.86 0.85  LATICACIDVEN  --   --   --   --  1.0  --     Estimated Creatinine Clearance: 55 mL/min (by C-G formula based on SCr of 0.85 mg/dL).    Allergies  Allergen Reactions  . Zithromax [Azithromycin] Swelling and Other (See Comments)    Mouth swelling and blisters   . Aspirin Other (See Comments)    Stomach cramping   . Ibuprofen Other (See Comments)    Pt states that she prefers not to take this medication.   . Iron Nausea And Vomiting and Other (See Comments)    FERROUS SULFATE  . Sulfa Antibiotics Nausea And Vomiting    Antimicrobials this admission:  11/8 Vancomycin  >> 11/11; 11/15 >> 11/16 (surg ppx) 11/8 Zosyn  >> 11/9 Anidulafungin >> 11/21 Levaquin >>   Dose adjustments this admission:  1gm q36hr (AUC 502, IBW, 1.29) > 750mg  q24  Microbiology results:  11/8 BCx: neg 11/8 MRSA PCR: neg 11/10 CDiff: neg/neg 11/10 GI Panel PCR: neg 11/12 left pleural fluid: ngf 11/13 bld x2: neg 11/14 UC: neg 11/15 Mediastinal fluid x 2 - negative 11/15 AFB smear - negative 11/12 L pleural fluid for fungus - no fungus isolated after 7 days, pending 11/15 fungus - to [facility] -  pending 11/18 blood ngtd  11/18 blood ngtd   Thank you for allowing pharmacy to be a part of this patient's care.  Albertina Parr, PharmD., BCPS Clinical Pharmacist Pager [phone]  Addendum: ID has seen patient and suspects lack of source control for intra-abdominal infection. Broadening antibiotic coverage from Zosyn to meropenem and stopping Levaquin. Stop Levaquin, Zosyn and start Meropenem 1 gm IV Q 8 hours.  Albertina Parr, PharmD., BCPS Clinical Pharmacist Pager [phone]

## 2017-10-28 ENCOUNTER — Inpatient Hospital Stay (HOSPITAL_COMMUNITY): Payer: Medicare HMO

## 2017-10-28 ENCOUNTER — Encounter (HOSPITAL_COMMUNITY): Payer: Self-pay | Admitting: Radiology

## 2017-10-28 LAB — CBC
HEMATOCRIT: 27.4 % — AB (ref 36.0–46.0)
HEMOGLOBIN: 8.6 g/dL — AB (ref 12.0–15.0)
MCH: 29.4 pg (ref 26.0–34.0)
MCHC: 31.4 g/dL (ref 30.0–36.0)
MCV: 93.5 fL (ref 78.0–100.0)
Platelets: 280 10*3/uL (ref 150–400)
RBC: 2.93 MIL/uL — ABNORMAL LOW (ref 3.87–5.11)
RDW: 21.1 % — AB (ref 11.5–15.5)
WBC: 18.3 10*3/uL — ABNORMAL HIGH (ref 4.0–10.5)

## 2017-10-28 LAB — COMPREHENSIVE METABOLIC PANEL
ALK PHOS: 120 U/L (ref 38–126)
ALT: 16 U/L (ref 14–54)
AST: 21 U/L (ref 15–41)
Albumin: 1.3 g/dL — ABNORMAL LOW (ref 3.5–5.0)
Anion gap: 6 (ref 5–15)
BUN: 18 mg/dL (ref 6–20)
CHLORIDE: 107 mmol/L (ref 101–111)
CO2: 22 mmol/L (ref 22–32)
Calcium: 7.8 mg/dL — ABNORMAL LOW (ref 8.9–10.3)
Creatinine, Ser: 0.87 mg/dL (ref 0.44–1.00)
GFR calc Af Amer: >60 mL/min (ref >60)
GFR calc non Af Amer: >60 mL/min (ref >60)
GLUCOSE: 110 mg/dL — AB (ref 65–99)
POTASSIUM: 4.5 mmol/L (ref 3.5–5.1)
Sodium: 135 mmol/L (ref 135–145)
Total Bilirubin: 0.3 mg/dL (ref 0.3–1.2)
Total Protein: 6 g/dL — ABNORMAL LOW (ref 6.5–8.1)

## 2017-10-28 LAB — MAGNESIUM: Magnesium: 1.9 mg/dL (ref 1.7–2.4)

## 2017-10-28 LAB — URINE CULTURE: CULTURE: NO GROWTH

## 2017-10-28 LAB — PHOSPHORUS: Phosphorus: 4.2 mg/dL (ref 2.5–4.6)

## 2017-10-28 LAB — GLUCOSE, CAPILLARY
GLUCOSE-CAPILLARY: 69 mg/dL (ref 65–99)
Glucose-Capillary: 109 mg/dL — ABNORMAL HIGH (ref 65–99)

## 2017-10-28 MED ORDER — PROMETHAZINE HCL 25 MG/ML IJ SOLN
6.2500 mg | Freq: Four times a day (QID) | INTRAMUSCULAR | Status: DC | PRN
  Administered 2017-11-02 – 2017-11-04 (×6): 6.25 mg via INTRAVENOUS
  Filled 2017-10-28 (×7): qty 1

## 2017-10-28 MED ORDER — ACETAMINOPHEN 10 MG/ML IV SOLN
1000.0000 mg | Freq: Three times a day (TID) | INTRAVENOUS | Status: AC | PRN
  Administered 2017-10-29 – 2017-10-30 (×3): 1000 mg via INTRAVENOUS
  Filled 2017-10-28 (×4): qty 100

## 2017-10-28 MED ORDER — IOPAMIDOL (ISOVUE-300) INJECTION 61%
INTRAVENOUS | Status: AC
Start: 2017-10-28 — End: 2017-10-28
  Administered 2017-10-28: 100 mL
  Filled 2017-10-28: qty 30

## 2017-10-28 MED ORDER — IOPAMIDOL (ISOVUE-300) INJECTION 61%
INTRAVENOUS | Status: AC
  Filled 2017-10-28: qty 100

## 2017-10-28 MED ORDER — TRAVASOL 10 % IV SOLN
INTRAVENOUS | Status: AC
  Administered 2017-10-28: 18:00:00 via INTRAVENOUS
  Filled 2017-10-28 (×2): qty 840

## 2017-10-28 MED ORDER — ZINC OXIDE 40 % EX OINT
TOPICAL_OINTMENT | CUTANEOUS | Status: DC | PRN
  Administered 2017-10-31: 1 via TOPICAL
  Filled 2017-10-28: qty 114

## 2017-10-28 MED ORDER — ACETAMINOPHEN 10 MG/ML IV SOLN
1000.0000 mg | Freq: Three times a day (TID) | INTRAVENOUS | Status: AC | PRN
  Administered 2017-10-28 – 2017-10-29 (×4): 1000 mg via INTRAVENOUS
  Filled 2017-10-28 (×4): qty 100

## 2017-10-28 MED ORDER — PROMETHAZINE HCL 25 MG/ML IJ SOLN
6.2500 mg | Freq: Three times a day (TID) | INTRAMUSCULAR | Status: DC
  Administered 2017-10-29 – 2017-11-03 (×9): 6.25 mg via INTRAVENOUS
  Filled 2017-10-28 (×12): qty 1

## 2017-10-28 MED ORDER — PROCHLORPERAZINE EDISYLATE 5 MG/ML IJ SOLN
5.0000 mg | INTRAMUSCULAR | Status: DC | PRN
  Administered 2017-10-29 – 2017-11-04 (×13): 5 mg via INTRAVENOUS
  Filled 2017-10-28 (×10): qty 2
  Filled 2017-10-28: qty 1
  Filled 2017-10-28 (×3): qty 2

## 2017-10-28 NOTE — Progress Notes (Signed)
Attempted to give PO tylenol. Pt unable to keep it down and immediately vomited. NP notified and placed orders for IV tylenol. Temp 102.8 at time of tylenol given.   Tressie Ellis, RN

## 2017-10-28 NOTE — Progress Notes (Signed)
Upon starting contrast for Danielle [name]'s Ct scan her 20 gage IV located in her Rt forearm extravasated and approximately 50 cc of Isovue 300 contrast media infiltrated. Her nurse was with her and removed the IV I placed an ice pack on the site and elevated the arm, Dr. Nichola Sizer came and examined her. Dr Nichola Sizer gave her information about what had happened and what to expect and informed the nurse  how to take care of the site.

## 2017-10-28 NOTE — Progress Notes (Signed)
Patient placed on rotation and low air loss mattress due to inability to turn self, refusing assisted mobility/turns, and moisture breakdown.

## 2017-10-28 NOTE — Progress Notes (Addendum)
[address]       ,[place] [address]             [phone]      7 Days Post-Op Procedure(s) (LRB): LEFT VIDEO ASSISTED THORACOSCOPY (VATS) for MEDISTINAL DRAINAGE (Left) Subjective: Just back from CT, results pending, CXR is stable Generally feels poorly  Objective: Vital signs in last 24 hours: Temp:  [98.1 F (36.7 C)-102.3 F (39.1 C)] 100.9 F (38.3 C) (11/22 0949) Pulse Rate:  [96-113] 103 (11/22 0752) Cardiac Rhythm: Sinus tachycardia (11/22 0830) Resp:  [19-41] 26 (11/22 0856) BP: (103-153)/(53-69) 134/66 (11/22 0752) SpO2:  [92 %-100 %] 100 % (11/22 0856)  Hemodynamic parameters for last 24 hours:    Intake/Output from previous day: 11/21 0701 - 11/22 0700 In: 2314.9 [P.O.:110; I.V.:1674.9; IV Piggyback:530] Out: 2765 [Urine:2645; Stool:100; Chest Tube:20] Intake/Output this shift: Total I/O In: -  Out: 825 [Urine:825]  General appearance: alert, cooperative, fatigued and no distress Heart: regular rate and rhythm Lungs: dim in bases Abdomen: soft, nontender Extremities: no edema or calf tenderness  Lab Results: Recent Labs    10/27/17 0500 10/28/17 0410  WBC 19.5* 18.3*  HGB 8.9* 8.6*  HCT 28.1* 27.4*  PLT 266 280   BMET:  Recent Labs    10/27/17 0500 10/28/17 0500  NA 134* 135  K 4.5 4.5  CL 106 107  CO2 23 22  GLUCOSE 116* 110*  BUN 18 18  CREATININE 0.85 0.87  CALCIUM 7.9* 7.8*    PT/INR: No results for input(s): LABPROT, INR in the last 72 hours. ABG    Component Value Date/Time   PHART 7.317 (L) 10/22/2017 0420   HCO3 17.9 (L) 10/22/2017 0420   TCO2 15 (L) 09/10/2017 1214   ACIDBASEDEF 7.1 (H) 10/22/2017 0420   O2SAT 96.1 10/22/2017 0420   CBG (last 3)  Recent Labs    10/27/17 2038 10/28/17 0000 10/28/17 0350  GLUCAP 70 91 109*    Meds Scheduled Meds: . acetaminophen  1,000 mg Oral Q6H   Or  . acetaminophen (TYLENOL) oral liquid 160 mg/5 mL  1,000 mg Oral Q6H  . bisacodyl  10 mg Oral  Daily  . feeding supplement (PRO-STAT SUGAR FREE 64)  30 mL Oral BID  . fentaNYL   Intravenous Q4H  . iopamidol      . lip balm  1 application Topical BID  . [START ON 10/29/2017] pantoprazole  40 mg Intravenous Q12H  . promethazine  12.5 mg Intravenous TID AC  . senna-docusate  1 tablet Oral QHS   Continuous Infusions: . acetaminophen Stopped (10/28/17 0147)  . anidulafungin Stopped (10/27/17 1920)  . meropenem (MERREM) IV Stopped (10/28/17 0650)  . pantoprozole (PROTONIX) infusion 8 mg/hr (10/28/17 0950)  . potassium chloride Stopped (10/22/17 1145)  . TPN CYCLIC-ADULT (ION) Stopped (10/28/17 1106)  . TPN CYCLIC-ADULT (ION)     PRN Meds:.acetaminophen, diphenhydrAMINE **OR** diphenhydrAMINE, fentaNYL (SUBLIMAZE) injection, hydrocortisone, naloxone **AND** sodium chloride flush, nystatin, ondansetron (ZOFRAN) IV, oxyCODONE, phenol, potassium chloride, promethazine, traMADol  Xrays Dg Chest Port 1 View  Result Date: 10/28/2017 CLINICAL DATA:  Mediastinal abscess EXAM: PORTABLE CHEST 1 VIEW COMPARISON:  10/27/2017 FINDINGS: Mediastinal drain on the left unchanged from the prior study. Bibasilar atelectasis unchanged. Small bilateral effusions unchanged. No pneumothorax Central line tips unchanged. One line in the SVC other in the azygos vein. IMPRESSION: Mediastinal drain unchanged in position. Bibasilar atelectasis/ infiltrate and small effusions unchanged. Electronically Signed   By: Franchot Gallo  M.D.   On: 10/28/2017 08:54   Dg Chest Port 1 View  Result Date: 10/27/2017 CLINICAL DATA:  Fever EXAM: PORTABLE CHEST 1 VIEW COMPARISON:  10/24/2017 FINDINGS: Left dialysis catheter and PICC line remain in place, unchanged. PICC line courses superiorly, unchanged. Interval removal of left chest tube. No pneumothorax. Mild cardiomegaly. Bilateral perihilar and lower lobe opacities, similar to prior study. IMPRESSION: Interval removal of left chest tube without pneumothorax. Otherwise no  significant change. Electronically Signed   By: Rolm Baptise M.D.   On: 10/27/2017 09:45    Assessment/Plan: S/P Procedure(s) (LRB): LEFT VIDEO ASSISTED THORACOSCOPY (VATS) for MEDISTINAL DRAINAGE (Left)  1 febrile, some improvement in leukocytosis, conts chest tube, current abx per ID 2 await  results, gen surgery is following 3 medical management per primary    LOS: 14 days    Danielle [name] 10/28/2017  I have seen and examined the patient and agree with the assessment and plan as outlined.  Mediastinal fluid collection has nearly completely resolved on CT abdomen performed today.  Keep tube in place.  Remainder of care per general surgery and medical teams.  Rexene Alberts, MD 10/28/2017 1:22 PM

## 2017-10-28 NOTE — Progress Notes (Signed)
-   Patient currently in Radiology for repeat CT . [facility] will follow tomorrow.   Otis Brace MD, [place] 10/28/2017, 10:49 AM  Contact #  [phone]

## 2017-10-28 NOTE — Progress Notes (Signed)
Patient unable to tolerate PO contrast for CT, notified CT tech.

## 2017-10-28 NOTE — Progress Notes (Signed)
PROGRESS NOTE    Danielle [name]  XMI:[id number] DOB: 1946-03-13 DOA: 10/28/2017 PCP: Iona Beard, MD    Brief Narrative: 71 y.o.F w/ a Hxof Crohn's disease, hiatal hernia,psoriasis, and SBO who underwent a type IV hiatal hernia repair in October 2018.Postoperatively the patient required endotracheal intubation, mechanical ventilation and the use of pressors. She was extubated on postop day #2. She improved clinically and was on parenteral nutrition, however she developed a pelvic fluid collection which required a drain. On 09/11/2017 she had blood cultures positive for Candida glabrata, and the pelvic abscess culture was positive for C. Albicans and C. Glabrata. She was given treatment withparenteral antifungals. She subsequently improved, was able to ambulate with physical therapy, weaned off of parenteral nutrition and was tolerating oral diet. ID recommended to continue Invanz andErasisfor 2 weeks. A PICC line was placed in her right arm and she was ultimately discharged home.  She returned on 10/01/2017 due to intermittent fevers. She was treated empirically and clinically improved to the point that she was able to be d/c home again on 11/1.  She returned to the ED 11/8 with complaints of persistent nausea associated with emesis. CT abdomen pelvis showed re accumulation of fluid collection in the lower mediastinum, bibasilar pleural effusion, another small complex collection along the posterior margin of the spleen. Patient underwent left video assisted thoracoscopy for mediastinal drainage of mediastinal abscess. ID has been helping with management of IV antibiotics.   Patient continue to spike fevers, persistent nausea and vomiting. She has been on TPN, which was discontinue due to persistent fevers.   Palliative care team met with patient and her family. Patients wants to continue with aggressive care.    Assessment & Plan:   Principal Problem:   Septic shock  ([place]) Active Problems:   Leukocytosis   Perianal Crohn's disease, with fistula ([place])   Acute renal failure superimposed on stage 3 chronic kidney disease (HCC)   Hiatal hernia   Immunosuppressed status (HCC)   Chest tube in place   Hypotension   Vomiting in adult patient   S/P thoracentesis   Anasarca   DVT (deep venous thrombosis) (HCC)   Protein-calorie malnutrition, moderate (HCC)   Mediastinal abscess (HCC)   Mediastinal post-op seromas   Pleural effusion, left   Patient's noncompliance with intervention / medical advice    Dehydration   Acute embolism and thrombosis of left axillary vein (HCC)   Goals of care, counseling/discussion   Palliative care encounter   Fever   Intra-abdominal infection   Hemoptysis   SevereSepsis/Mediastinal abscess; Patient underwent esophagogram showing 2.5 cm area of extrinsic compression upon esophagus, underwent also thoracentesis. Case was reviewed by IR and gastroenterologist, recommendation was for CVTS consult. Patient underwent VAST drainage 11-15. S/P VATS drainage on 11/15 . Patient continue to spike fever.  Repeated CT scan 11-19; Ill-defined multiloculated rim enhancing fluid collection and extensive edema in posterior mediastinum inferior to mediastinal drain and anterior to aorta, probably representing the residual of drained abscess. Discrete 14 mm abscess present in the right posterior mediastinum. Multiloculated rim enhancing fluid collection surrounding the spleen and extending into left pericolic gutter, likely abscess. Small bilateral pleural effusions with left pleural drain. Stable dependent lower lobe consolidations may represent atelectasis or pneumonia. -Per CVTS mediastinal drain in good position, still has phlegmon in the area.  Plan to get CT abdomen to evaluate spleen abscess.   Type IV hiatal hernia repair in October 2018. Post operative pelvic abscess;  CT chest 11-19. For  Multiloculated rim enhancing fluid  collection surrounding the spleen and extending into left pericolic gutter, likely abscess. -spiking fever, leukocytosis. Will get CT abdomen pelvis to evaluate spleen abscess and options for drainage,    Urinary Retention; continue with foley catheter; will need voiding trial.   Nutrition;  Nutrition started on TPN.  Patient with a recent fungemia with candida glabrata and albicans.  Per Dr Fredderick Erb ok to continue with TPN for now.  Per Dr Rosendo Gros; oral trial. He doubt G tube will be stable.    Anemia; no further hematemesis.  Hb slowly trending down. Monitor daily   Chronic diastolic CHF Compensated.   Recent Hematemesis/hemoptysis -stopped IV heparin  -lateral upper extremity DVT 09/2017.  -positive DVT in the left radial vein at this time -no recurrent hematemesis or hemoptysis. IV Protonix drip -CT scan of the chest shows evidence of esophageal wall thickening consistent with esophagitis -GI following.   RUE DVT; holding heparin due to hematemesis. When hb stable plan to resume heparin if clear by GI.    History of Crohns/ chronic diarrhea  C diff 11-10 was negative.      DVT prophylaxis: SCD.  Code Status: full code.  Family Communication: care discussed with patient.  Disposition Plan: remain in patient for IV antibiotics.    Consultants:   General surgery  CVTS  GI   Procedures: events; Significant Events: 11/8 readmit 11/11 PICC placed 11/12 thoracentesis 11/5 to OR at [facility] for L VATS for mediastinal drainage     Antimicrobials; antifungal  Erasix 10-16-2017  Meropenem 10-27-2017     Subjective: She denies hematemesis. Agree to proceed with CT scan.    Objective: Vitals:   10/28/17 0000 10/28/17 0111 10/28/17 0406 10/28/17 0418  BP: (!) 153/69  103/63   Pulse: (!) 113  96   Resp: 20 (!) _0 Temp: (!) 102.3 F (39.1 C)  99 F (37.2 C)   TempSrc: Rectal  Rectal   SpO2: 92% 98% 98% 99%  Weight:      Height:         Intake/Output Summary (Last 24 hours) at 10/28/2017 0728 Last data filed at 10/28/2017 0656 Gross per 24 hour  Intake 2314.87 ml  Output 2765 ml  Net -450.13 ml   Filed Weights   10/15/17 1449 10/20/17 1600 10/16/2017 0500  Weight: 55.9 kg (123 lb 3.8 oz) 55.3 kg (121 lb 14.6 oz) 55.8 kg (123 lb 0.3 oz)    Examination:  General exam: Appears calm and comfortable, thin appearing.  Respiratory system: bilateral ronchus.  Respiratory effort normal. Cardiovascular system: S1 & S2 heard, RRR. No JVD, murmurs, rubs, gallops or clicks. No pedal edema. Gastrointestinal system: Abdomen is nondistended, soft and nontender. midline wound cover.  Central nervous system: Alert and oriented. No focal neurological deficits. Extremities: Symmetric 5 x 5 power. Psychiatry: Judgement and insight appear normal. Mood & affect appropriate.     Data Reviewed: I have personally reviewed following labs and imaging studies  CBC: Recent Labs  Lab 10/25/17 0005 10/25/17 2045 10/26/17 0323 10/27/17 0500 10/28/17 0410  WBC 24.9* 31.8* 23.9* 19.5* 18.3*  NEUTROABS 18.2*  --  17.0* 13.1*  --   HGB 9.6* 10.5* 9.2* 8.9* 8.6*  HCT 29.5* 32.0* 28.4* 28.1* 27.4*  MCV 84.0 83.3 84.3 85.4 93.5  PLT 274 291 253 266 025   Basic Metabolic Panel: Recent Labs  Lab 10/22/17 0400 10/23/17 0513 10/24/17 0006 10/25/17 0005 10/26/17 0323 10/27/17 0500 10/28/17 0500  NA  135 135 135 135 135 134* 135  K 3.6 3.9 4.2 3.5 3.8 4.5 4.5  CL 111 109 109 109 106 106 107  CO2 18* 18* 18* 20* 21* 23 22  GLUCOSE 205* 85 91 124* 114* 116* 110*  BUN _0 CREATININE 0.77 0.80 0.88 0.84 0.86 0.85 0.87  CALCIUM 8.0* 7.7* 7.9* 7.7* 7.9* 7.9* 7.8*  MG 1.6* 2.1 2.0 1.9  --   --  1.9  PHOS 3.0 3.2  --  3.4  --   --  4.2   GFR: Estimated Creatinine Clearance: 53.8 mL/min (by C-G formula based on SCr of 0.87 mg/dL). Liver Function Tests: Recent Labs  Lab 10/23/17 0513 10/25/17 0005 10/28/17 0500   AST 28 32 21  ALT _1 ALKPHOS 111 145* 120  BILITOT 0.4 0.3 0.3  PROT 6.9 6.0* 6.0*  ALBUMIN 1.6* 1.5* 1.3*   No results for input(s): LIPASE, AMYLASE in the last 168 hours. No results for input(s): AMMONIA in the last 168 hours. Coagulation Profile: No results for input(s): INR, PROTIME in the last 168 hours. Cardiac Enzymes: No results for input(s): CKTOTAL, CKMB, CKMBINDEX, TROPONINI in the last 168 hours. BNP (last 3 results) No results for input(s): PROBNP in the last 8760 hours. HbA1C: No results for input(s): HGBA1C in the last 72 hours. CBG: Recent Labs  Lab 10/27/17 0619 10/27/17 1428 10/27/17 2038 10/28/17 0000 10/28/17 0350  GLUCAP 109* 73 70 91 109*   Lipid Profile: No results for input(s): CHOL, HDL, LDLCALC, TRIG, CHOLHDL, LDLDIRECT in the last 72 hours. Thyroid Function Tests: No results for input(s): TSH, T4TOTAL, FREET4, T3FREE, THYROIDAB in the last 72 hours. Anemia Panel: No results for input(s): VITAMINB12, FOLATE, FERRITIN, TIBC, IRON, RETICCTPCT in the last 72 hours. Sepsis Labs: Recent Labs  Lab 10/26/17 0323  LATICACIDVEN 1.0    Recent Results (from the past 240 hour(s))  Culture, fungus without smear     Status: None (Preliminary result)   Collection Time: 10/18/17  3:19 PM  Result Value Ref Range Status   Specimen Description Pleural, L  Final   Special Requests NONE  Final   Culture   Final    NO FUNGUS ISOLATED AFTER 7 DAYS Performed at [facility], [place] [address], [place], [place] [address]    Report Status PENDING  Incomplete  Culture, body fluid-bottle     Status: None   Collection Time: 10/18/17  3:19 PM  Result Value Ref Range Status   Specimen Description FLUID LEFT PLEURAL  Final   Special Requests BOTTLES DRAWN AEROBIC AND ANAEROBIC 10CC  Final   Culture   Final    NO GROWTH 5 DAYS Performed at [facility], [place] [address], [place], [place] [address]    Report Status 10/23/2017 FINAL  Final  Gram  stain     Status: None   Collection Time: 10/18/17  3:19 PM  Result Value Ref Range Status   Specimen Description FLUID LEFT PLEURAL  Final   Special Requests NONE  Final   Gram Stain   Final    ABUNDANT WBC PRESENT,BOTH PMN AND MONONUCLEAR NO ORGANISMS SEEN Performed at [facility], [place] [address] [place], [place] [address]    Report Status 10/18/2017 FINAL  Final  Culture, blood (routine x 2)     Status: None   Collection Time: 10/19/17  9:45 AM  Result Value Ref Range Status   Specimen Description  BLOOD BLOOD RIGHT FOREARM  Final   Special Requests IN PEDIATRIC BOTTLE Blood Culture adequate volume  Final   Culture   Final    NO GROWTH 5 DAYS Performed at [facility], [place] [address], [place], [place] [address]    Report Status 10/24/2017 FINAL  Final  Culture, blood (routine x 2)     Status: None   Collection Time: 10/19/17  9:48 AM  Result Value Ref Range Status   Specimen Description BLOOD RIGHT HAND  Final   Special Requests IN PEDIATRIC BOTTLE Blood Culture adequate volume  Final   Culture   Final    NO GROWTH 5 DAYS Performed at [facility], [place] [address], [place], [place] [address]    Report Status 10/24/2017 FINAL  Final  Culture, Urine     Status: None   Collection Time: 10/20/17  5:06 PM  Result Value Ref Range Status   Specimen Description URINE, CATHETERIZED  Final   Special Requests NONE  Final   Culture   Final    NO GROWTH Performed at [facility], [place] [address] [place], [place] [address]    Report Status 10/22/2017 FINAL  Final  Surgical PCR screen     Status: None   Collection Time: 11/01/2017  3:21 AM  Result Value Ref Range Status   MRSA, PCR NEGATIVE NEGATIVE Final   Staphylococcus aureus NEGATIVE NEGATIVE Final    Comment: (NOTE) The Xpert SA Assay (FDA approved for NASAL specimens in patients 37 years of age and older), is one component of a comprehensive surveillance program. It is not intended to diagnose  infection nor to guide or monitor treatment.   Body fluid culture     Status: None   Collection Time: 10/25/2017  9:02 AM  Result Value Ref Range Status   Specimen Description FLUID  Final   Special Requests MEDISTINAL PATIENT ON FOLLOWING ZOSYN AND VANC  Final   Gram Stain   Final    MODERATE WBC PRESENT,BOTH PMN AND MONONUCLEAR NO ORGANISMS SEEN    Culture NO GROWTH 3 DAYS  Final   Report Status 10/24/2017 FINAL  Final  Body fluid culture     Status: None   Collection Time: 10/18/2017  9:02 AM  Result Value Ref Range Status   Specimen Description FLUID PLEURAL LEFT  Final   Special Requests PATIENT ON FOLLOWING ZOSYN AND VANC  Final   Gram Stain   Final    MODERATE WBC PRESENT, PREDOMINANTLY MONONUCLEAR NO ORGANISMS SEEN    Culture NO GROWTH 3 DAYS  Final   Report Status 10/24/2017 FINAL  Final  Fungus Culture With Stain     Status: None (Preliminary result)   Collection Time: 10/20/2017  9:02 AM  Result Value Ref Range Status   Fungus Stain Final report  Final    Comment: (NOTE) Performed At: [facility] [place], [place] [id number] Rush Farmer MD [id number]    Fungus (Mycology) Culture PENDING  Incomplete   Fungal Source FLUID  Final    Comment: PLEURAL LEFT PATIENT ON FOLLOWING ZOSYN AND VANC   Acid Fast Smear (AFB)     Status: None   Collection Time: 10/17/2017  9:02 AM  Result Value Ref Range Status   AFB Specimen Processing Concentration  Final   Acid Fast Smear Negative  Final    Comment: (NOTE) Performed At: [facility] [place], [place] [id number] Rush Farmer MD [id number]  Source (AFB) FLUID  Final    Comment: PLEURAL LEFT PATIENT ON FOLLOWING ZOSYN AND VANC   Fungus Culture Result     Status: None   Collection Time: 11/03/2017  9:02 AM  Result Value Ref Range Status   Result 1 Comment  Final    Comment: (NOTE) KOH/Calcofluor preparation:  no fungus observed. Performed At: [facility] [place], [place] [id number] Rush Farmer MD [id number]   Fungus Culture With Stain     Status: None (Preliminary result)   Collection Time: 10/09/2017  9:42 AM  Result Value Ref Range Status   Fungus Stain Final report  Final    Comment: (NOTE) Performed At: [facility] [place], [place] [id number] Rush Farmer MD [id number]    Fungus (Mycology) Culture PENDING  Incomplete   Fungal Source FLUID  Final    Comment: MEDISTINAL PATIENT ON FOLLOWING ZOSYN AND VANC    Acid Fast Smear (AFB)     Status: None   Collection Time: 11/03/2017  9:42 AM  Result Value Ref Range Status   AFB Specimen Processing Concentration  Final   Acid Fast Smear Negative  Final    Comment: (NOTE) Performed At: [facility] [place], [place] [id number] Rush Farmer MD [id number]    Source (AFB) FLUID  Final    Comment: MEDISTINAL PATIENT ON FOLLOWING ZOSYN AND VANC   Fungus Culture Result     Status: None   Collection Time: 10/11/2017  9:42 AM  Result Value Ref Range Status   Result 1 Comment  Final    Comment: (NOTE) KOH/Calcofluor preparation:  no fungus observed. Performed At: [facility] [place], [place] [id number] Rush Farmer MD [id number]   Culture, blood (routine x 2)     Status: None (Preliminary result)   Collection Time: 10/24/17 12:06 AM  Result Value Ref Range Status   Specimen Description BLOOD LEFT FOOT  Final   Special Requests   Final    BOTTLES DRAWN AEROBIC AND ANAEROBIC Blood Culture adequate volume   Culture NO GROWTH 3 DAYS  Final   Report Status PENDING  Incomplete  Culture, blood (routine x 2)     Status: None (Preliminary result)   Collection Time: 10/24/17 12:16 AM  Result Value Ref Range Status   Specimen Description BLOOD RIGHT FOOT  Final   Special Requests   Final    BOTTLES DRAWN AEROBIC ONLY Blood Culture results may not be optimal due to an inadequate  volume of blood received in culture bottles   Culture NO GROWTH 3 DAYS  Final   Report Status PENDING  Incomplete  Fungus culture, blood     Status: None (Preliminary result)   Collection Time: 10/24/17 12:16 AM  Result Value Ref Range Status   Specimen Description BLOOD LEFT FOOT  Final   Special Requests   Final    BOTTLES DRAWN AEROBIC AND ANAEROBIC Blood Culture adequate volume   Culture NO GROWTH 3 DAYS  Final   Report Status PENDING  Incomplete  Culture, blood (routine x 2)     Status: None (Preliminary result)   Collection Time: 10/24/17  3:50 PM  Result Value Ref Range Status   Specimen Description BLOOD RIGHT FOOT  Final   Special Requests IN PEDIATRIC BOTTLE Blood Culture adequate volume  Final   Culture NO GROWTH 3 DAYS  Final   Report Status PENDING  Incomplete         Radiology  Studies: Dg Chest Port 1 View  Result Date: 10/27/2017 CLINICAL DATA:  Fever EXAM: PORTABLE CHEST 1 VIEW COMPARISON:  10/24/2017 FINDINGS: Left dialysis catheter and PICC line remain in place, unchanged. PICC line courses superiorly, unchanged. Interval removal of left chest tube. No pneumothorax. Mild cardiomegaly. Bilateral perihilar and lower lobe opacities, similar to prior study. IMPRESSION: Interval removal of left chest tube without pneumothorax. Otherwise no significant change. Electronically Signed   By: Rolm Baptise M.D.   On: 10/27/2017 09:45        Scheduled Meds: . acetaminophen  1,000 mg Oral Q6H   Or  . acetaminophen (TYLENOL) oral liquid 160 mg/5 mL  1,000 mg Oral Q6H  . bisacodyl  10 mg Oral Daily  . feeding supplement (PRO-STAT SUGAR FREE 64)  30 mL Oral BID  . fentaNYL   Intravenous Q4H  . lip balm  1 application Topical BID  . [START ON 10/29/2017] pantoprazole  40 mg Intravenous Q12H  . promethazine  12.5 mg Intravenous TID AC  . senna-docusate  1 tablet Oral QHS   Continuous Infusions: . acetaminophen Stopped (10/28/17 0147)  . anidulafungin Stopped (10/27/17  1920)  . meropenem (MERREM) IV Stopped (10/28/17 0650)  . pantoprozole (PROTONIX) infusion 8 mg/hr (10/27/17 2309)  . potassium chloride Stopped (10/22/17 1145)  . TPN CYCLIC-ADULT (ION) 99 mL/hr at 10/27/17 1815  . TPN CYCLIC-ADULT (ION)       LOS: 14 days    Time spent: 35 minutes.     Elmarie Shiley, MD [facility] Pager [phone]  If 7PM-7AM, please contact night-coverage [email or web address] Password [facility] 10/28/2017, 7:28 AM

## 2017-10-28 NOTE — Progress Notes (Addendum)
Daily Progress Note   Patient Name: Danielle [name]       Date: 10/28/2017 DOB: Mar 26, 1946  Age: 71 y.o. MRN#: [id number] Attending Physician: Danielle Shiley, MD Primary Care Physician: Danielle Beard, MD Admit Date: 10/31/2017  Reason for Consultation/Follow-up: Establishing goals of care  Subjective: Danielle [name] is lying in bed. Continues to be frustrated with testing and lack of progress.   Length of Stay: 14  Current Medications: Scheduled Meds:  . acetaminophen  1,000 mg Oral Q6H   Or  . acetaminophen (TYLENOL) oral liquid 160 mg/5 mL  1,000 mg Oral Q6H  . bisacodyl  10 mg Oral Daily  . feeding supplement (PRO-STAT SUGAR FREE 64)  30 mL Oral BID  . fentaNYL   Intravenous Q4H  . iopamidol      . lip balm  1 application Topical BID  . [START ON 10/29/2017] pantoprazole  40 mg Intravenous Q12H  . promethazine  6.25 mg Intravenous TID AC  . senna-docusate  1 tablet Oral QHS    Continuous Infusions: . acetaminophen Stopped (10/28/17 0147)  . anidulafungin Stopped (10/27/17 1920)  . meropenem (MERREM) IV 1 g (10/28/17 1332)  . potassium chloride Stopped (10/22/17 1145)  . TPN CYCLIC-ADULT (ION)      PRN Meds: acetaminophen, diphenhydrAMINE **OR** diphenhydrAMINE, fentaNYL (SUBLIMAZE) injection, hydrocortisone, naloxone **AND** sodium chloride flush, nystatin, ondansetron (ZOFRAN) IV, oxyCODONE, phenol, potassium chloride, promethazine, traMADol  Physical Exam     Constitutional: She is oriented to person, place, and time. She has a sickly appearance.  Frail, malnourished  HENT:  Head: Normocephalic and atraumatic.  Cardiovascular: Regular rhythm. Tachycardia present.  Pulmonary/Chest: Effort normal. No accessory muscle usage. No tachypnea. No respiratory distress. She  has decreased breath sounds in the left lower field.  Lt CT in place  Abdominal: Soft. Normal appearance.  Neurological: She is alert and oriented to person, place, and time.  Psychiatric:  Tired, frustrated  Nursing note and vitals reviewed.    Vital Signs: BP 134/66 (BP Location: Left Leg)   Pulse (!) 103   Temp (!) 100.9 F (38.3 C) (Rectal)   Resp (!) 28   Ht 5' 4.5" (1.638 m)   Wt 55.8 kg (123 lb 0.3 oz)   SpO2 100%   BMI 20.79 kg/m  SpO2: SpO2: 100 % O2 Device:  O2 Device: Not Delivered O2 Flow Rate: O2 Flow Rate (L/min): 2 L/min  Intake/output summary:   Intake/Output Summary (Last 24 hours) at 10/28/2017 1359 Last data filed at 10/28/2017 1000 Gross per 24 hour  Intake 2314.87 ml  Output 3590 ml  Net -1275.13 ml   LBM: Last BM Date: 10/27/17 Baseline Weight: Weight: 59 kg (130 lb) Most recent weight: Weight: 55.8 kg (123 lb 0.3 oz)       Palliative Assessment/Data: 20%    Flowsheet Rows     Most Recent Value  Intake Tab  Referral Department  Hospitalist  Unit at Time of Referral  Med/Surg Unit  Palliative Care Primary Diagnosis  Nephrology  Date Notified  10/26/17  Palliative Care Type  New Palliative care  Reason for referral  Clarify Goals of Care  Date of Admission  11/04/2017  Date first seen by Palliative Care  10/27/17  # of days Palliative referral response time  1 Day(s)  # of days IP prior to Palliative referral  12  Clinical Assessment  Psychosocial & Spiritual Assessment  Palliative Care Outcomes      Patient Active Problem List   Diagnosis Date Noted  . Goals of care, counseling/discussion   . Palliative care encounter   . Fever   . Intra-abdominal infection   . Hemoptysis   . Dehydration   . Acute embolism and thrombosis of left axillary vein ([place])   . Septic shock ([place]) 10/17/2017  . Patient's noncompliance with intervention / medical advice  10/17/2017  . 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 Pleural effusion, left  10/16/2017  . Mediastinal abscess ([place]) 10/15/2017  . DVT (deep venous thrombosis) ([place])   . Metabolic acidosis   . Thrombocytosis ([place])   . Protein-calorie malnutrition, moderate ([place])   . Normochromic normocytic anemia   . SIRS (systemic inflammatory response syndrome) ([place]) 10/01/2017  . Pressure injury of skin 09/25/2017  . Hypoglycemia 09/25/2017  . Electrolyte imbalance 09/15/2017  . Anasarca 09/13/2017  . S/P thoracentesis 09/11/2017  . Peritonitis ([place]) 09/11/2017  . Fungemia 09/11/2017  . Gastrostomy tube in place ([place]) 09/08/2017  . Protein-calorie malnutrition, severe 06/10/2017  . Vomiting in adult patient 06/08/2017  . 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 Hypotension 05/07/2017  . UTI (urinary tract infection) 05/07/2017  . Chest tube in place   . Crohn disease ([place]) 02/19/2017  . Immunosuppressed status ([place]) 12/09/2015  . Hiatal hernia 12/07/2015  . Acute renal failure superimposed on stage 3 chronic kidney disease ([place]) 12/06/2015  . Perianal Crohn's disease, with fistula ([place])   . Hypomagnesemia   . Leukocytosis 11/06/2011    Palliative Care Assessment & Plan   HPI: 71 y.o. female  with past medical history of Crohn's disease, incarcerated hiatal hernia, CKD stage 3, diastolic heart failure, SBO, left buttock abscess, psoriasis, colostomy with take down ~1 month 2009. Admitted on 10/20/2017 with nausea and vomiting. Has had complicated history with hernia operation 96/2 and stay complicated by abd pain and G tube leaking requiring reoperation, purulent peritonitis, requiring intubation and vasopressors, ileus, fungemia, pelvic fluid collection drained 10/3-10/21 but discharged to home being able to walk and tolerated diet. Readmitted 10/26-11/1 with fever felt to be related to dehydration and post infection process followed by ID and with RUE DVT. Currently ID following for treatment of infection and monitoring blood cultures and fluid collections. Had thoracentesis  11/12 and VATs for mediastinum abscess 11/15. She is severely malnourished (on TPN) and deconditioned with poorly controlled infection with continued high  fevers and nausea. Recommendation to stop TPN to better control infection but intake is very poor. IR consulted and request CT abd to assess for feeding tube placement. Palliative requested to assist with GOC.    Assessment: Danielle [name] continues to be frustrated with all her testing and procedures and lack of progress. Will refuse tests at times. We discussed the fact that if she continues to desire aggressive care these tests are necessary for her treatment plan - and that we are concerned with her continued high fevers. Sister is also at bedside and encouraging Ms. Spear to "keep fighting." Ms. Woodside agrees and says she just wants to know ahead of time when a test is being ordered so she can prepare herself.   We also discussed her nausea. Still not tolerating po intake and not very surprising. Extremely difficult situation. Have made recommendations for nausea below. Therapeutic listening and emotional support provided.   Ms. Novak is becoming increasingly tired and frustrated. Not ready "to give up" but I am unsure how much she will be able to continue to take. She needs more time. Also seems to often struggle with the fact "I was fine before my surgery." Poor understanding of why she is so sick and why she is not improving. Will follow up tomorrow.   Recommendations/Plan:  Nausea/vomiting: Zofran gives her headaches -try and avoid. Phenergan scheduled TID with meals 6.25 mg IV. Phenergan 6.25 mg IV every 6 hours prn. Phenergan was increased and QTc is rising which is why I had ordered such a small dose. Decreased dose again and RN monitoring closely QTc and efficacy. Will also add prn Compazine which may have less effect on QTc but also monitor closely. Monitor QTc and call/hold antiemetics for QTc >0.42.   Malnutrition: Plans to d/c TPN.  Ms. Demauro only likes berry Breeze and avoiding red colored liquids to monitor bleeding. Agree to continue clear liquids. Spoke with nutrition about Ensure clear and we do not stock in hospital but Cheri Rous will bring some tomorrow.   Sacrum/buttocks breakdown: Desitin ordered as family says this worked best in the past. [place] consulted for further recommendations.    Goals of Care and Additional Recommendations:  Limitations on Scope of Treatment: Full Scope Treatment, refuses feeding tube  Code Status:  Full code  Prognosis:   Very poor with severe deconditioning and extensive infection burden. Unfortunately not really making any progress.   Discharge Planning:  To Be Determined   Thank you for allowing the Palliative Medicine Team to assist in the care of this patient.   Total Time 40 min Prolonged Time Billed  no       Greater than 50%  of this time was spent counseling and coordinating care related to the above assessment and plan.  Vinie Sill, NP Palliative Medicine Team Pager # [phone] (M-F 8a-5p) Team Phone # [phone] (Nights/Weekends)

## 2017-10-28 NOTE — Progress Notes (Signed)
[place] NOTE   Pharmacy Consult for TPN Indication: Poor PO intake / unable to place repeat G-tube   Patient Measurements: Height: 5' 4.5" (163.8 cm) Weight: 123 lb 0.3 oz (55.8 kg) IBW/kg (Calculated) : 55.85 TPN AdjBW (KG): 59 Body mass index is 20.79 kg/m.  Assessment:  69 yoF re-admit 11/8 with N/V, loose stools (baseline w/Crohn's). Was hospitalized for a month in October and developed fungemia and treated with Invanz and anidulafungin. Re-admitted a week later with intermittent fevers and difficultly swallowing. CT chest showed recurrent mediastinal fluid abscess.  PMH: Crohns, HH, hx SBO  Significant events:  11/15 Left VATs to drain mediastinum   GI: Mediastinal abscess complex, has been advanced to full liquid diet but has nausea and vomiting with almost anything PO. Had 4 episodes of emesis earlier this week but volume not charted. Patient has poor appetite and still experiencing nausea with any type of PO intake. GI recommending possible Gtube placement but PO intake would be best. Patient also refusing repeat CT scan. [place] meeting to take place on 11/21. Albumin low at 1.3. Last BM was 11/19. Endo: CBGs controlled. 70s off TPN and 100-110s on cycle. Insulin stopped on 11/21 Insulin requirements in the past 24 hours: 0 unit regular insulin  Lytes: wnl today. CoCa 10. Renal: SCr stable, UOP better. Net positive this admit but I/Os not all charted. Pulm: RA; Bilateral chest tube output at 40 mL / 24 hours, chest tubes to remain in place for several days  Cards: BP mostly controlled, HR 80-110s Hepatobil: AST/ALT and Tbili wnl. Trig 83 ID: Continues on Zosyn and anidulafungin for mediastinal abscess. Tmax of 102.3 yesteday, WBC down to 18.3. Drain in place. Cultures negative so far  Best Practices: SCDs TPN Access: PICC 11/11>> TPN start date: 11/11>>  Nutritional Goals (per RD recommendation on 11/19): Kcal:  1500-1700 Protein:   75-90 grams  Fluid:  >/= 1.5 L/day  Current Nutrition:  Clear liquid (appetite poor) Custom TPN  Plan:  Continue cyclic TPN at 18 hr cycle. Infuse 1,680 mL over 18 hrs : 49 mL/hr x 1 hr, then 99 mL/hr x 14 hrs, then 49 mL/hr x 1 hr.  TPN provides 84 g of protein, 235 g of dextrose, and 50 g lipids providing 1,640 total kcal, which meets ~100% of patient needs.  Electrolytes in TPN: Max acetate AddMVI,trace elements in TPN Continue CBG checks for hypoglycemic events Monitor TPN labs, clinical progress  F/U ability to tolerate PO diet, GOC meeting today   Elenor Quinones, PharmD, [facility] Clinical Pharmacist Pager [phone] 10/28/2017 7:04 AM

## 2017-10-28 NOTE — Progress Notes (Signed)
Patient ID: Danielle [name], female   DOB: 1946/05/31, 71 y.o.   MRN: [id number] Asked to see patient by Dr Tyrell Antonio in follow up for ct chest done 11/19 that describes a rim enhancing fluid collection around spleen and into left gutter.  Agree with repeat ct and then will follow up

## 2017-10-28 NOTE — Progress Notes (Signed)
Patient refusing pillow wedge support, has rotation low air loss mattress. Will continue to attempt additional turns.

## 2017-10-29 ENCOUNTER — Encounter (HOSPITAL_COMMUNITY): Payer: Self-pay | Admitting: *Deleted

## 2017-10-29 ENCOUNTER — Inpatient Hospital Stay (HOSPITAL_COMMUNITY): Payer: Medicare HMO

## 2017-10-29 DIAGNOSIS — L0291 Cutaneous abscess, unspecified: Secondary | ICD-10-CM

## 2017-10-29 DIAGNOSIS — R112 Nausea with vomiting, unspecified: Secondary | ICD-10-CM

## 2017-10-29 LAB — CBC
HEMATOCRIT: 28 % — AB (ref 36.0–46.0)
Hemoglobin: 8.9 g/dL — ABNORMAL LOW (ref 12.0–15.0)
MCH: 26.9 pg (ref 26.0–34.0)
MCHC: 31.8 g/dL (ref 30.0–36.0)
MCV: 84.6 fL (ref 78.0–100.0)
Platelets: 286 10*3/uL (ref 150–400)
RBC: 3.31 MIL/uL — ABNORMAL LOW (ref 3.87–5.11)
RDW: 19.1 % — AB (ref 11.5–15.5)
WBC: 20.6 10*3/uL — ABNORMAL HIGH (ref 4.0–10.5)

## 2017-10-29 LAB — BASIC METABOLIC PANEL
Anion gap: 7 (ref 5–15)
BUN: 18 mg/dL (ref 6–20)
CALCIUM: 8.2 mg/dL — AB (ref 8.9–10.3)
CO2: 22 mmol/L (ref 22–32)
CREATININE: 0.88 mg/dL (ref 0.44–1.00)
Chloride: 106 mmol/L (ref 101–111)
GFR calc non Af Amer: >60 mL/min (ref >60)
GLUCOSE: 109 mg/dL — AB (ref 65–99)
Potassium: 4.6 mmol/L (ref 3.5–5.1)
Sodium: 135 mmol/L (ref 135–145)

## 2017-10-29 LAB — CULTURE, BLOOD (ROUTINE X 2)
CULTURE: NO GROWTH
Culture: NO GROWTH
Culture: NO GROWTH
SPECIAL REQUESTS: ADEQUATE
Special Requests: ADEQUATE

## 2017-10-29 LAB — GLUCOSE, CAPILLARY
Glucose-Capillary: 115 mg/dL — ABNORMAL HIGH (ref 65–99)
Glucose-Capillary: 96 mg/dL (ref 65–99)
Glucose-Capillary: 103 mg/dL — ABNORMAL HIGH (ref 65–99)

## 2017-10-29 LAB — PROTIME-INR
INR: 1.24
PROTHROMBIN TIME: 15.5 s — AB (ref 11.4–15.2)

## 2017-10-29 LAB — C DIFFICILE QUICK SCREEN W PCR REFLEX
C DIFFICILE (CDIFF) INTERP: NOT DETECTED
C DIFFICILE (CDIFF) TOXIN: NEGATIVE
C DIFFICLE (CDIFF) ANTIGEN: NEGATIVE

## 2017-10-29 MED ORDER — SODIUM CHLORIDE 0.9 % IV SOLN: INTRAVENOUS | Status: AC

## 2017-10-29 MED ORDER — MIDAZOLAM HCL 2 MG/2ML IJ SOLN
INTRAMUSCULAR | Status: AC | PRN
  Administered 2017-10-29 (×2): 1 mg via INTRAVENOUS
  Administered 2017-10-29: 0.5 mg via INTRAVENOUS
  Administered 2017-10-29: 1 mg via INTRAVENOUS
  Administered 2017-10-29: 0.5 mg via INTRAVENOUS

## 2017-10-29 MED ORDER — TRAVASOL 10 % IV SOLN
INTRAVENOUS | Status: AC
  Administered 2017-10-29: 18:00:00 via INTRAVENOUS
  Filled 2017-10-29 (×2): qty 840

## 2017-10-29 MED ORDER — SODIUM CHLORIDE 0.9% FLUSH: 10.0000 mL | INTRAVENOUS | Status: DC | PRN

## 2017-10-29 MED ORDER — MIDAZOLAM HCL 2 MG/2ML IJ SOLN
INTRAMUSCULAR | Status: AC
  Filled 2017-10-29: qty 4

## 2017-10-29 MED ORDER — SODIUM CHLORIDE 0.9% FLUSH
5.0000 mL | Freq: Three times a day (TID) | INTRAVENOUS | Status: DC
  Administered 2017-10-29 – 2017-11-05 (×15): 5 mL via INTRAVENOUS

## 2017-10-29 MED ORDER — LIDOCAINE HCL 1 % IJ SOLN
INTRAMUSCULAR | Status: AC
  Filled 2017-10-29: qty 20

## 2017-10-29 NOTE — Progress Notes (Signed)
Patient ID: Danielle [name], female   DOB: 1946/10/02, 71 y.o.   MRN: [id number]  50 cc IV contrast was extravasated into Rt forearm yesterday in CT  Site is NT No redness No swelling noted Skin is intact- no blisters; no signs of infection  FROM of fingers Sensation intact  Reassurance to pt  Call us of need [place].

## 2017-10-29 NOTE — Progress Notes (Addendum)
Subjective: The patient was seen and examined at bedside. She is on a cooling blanket as she remains febrile. Plan is to call for IR today for percutaneous drainage of abdominal fluid collections. Patient has been on clear liquid diet, reports being nauseous and vomiting most of the fluid she takes. Denies abdominal pain. Has a rectal tube draining liquid green stool.  Objective: Vital signs in last 24 hours: Temp:  [98 F (36.7 C)-102.2 F (39 C)] 99.8 F (37.7 C) (11/23 1031) Pulse Rate:  [97-113] 102 (11/23 0300) Resp:  [22-34] 33 (11/23 0806) BP: (100-146)/(56-80) 146/80 (11/23 0700) SpO2:  [85 %-100 %] 100 % (11/23 0806) Weight change:  Last BM Date: 10/28/17  PE: Chronically ill-appearing GENERAL: Obvious pallor no icterus ABDOMEN: Soft, nontender, normoactive bowel sounds EXTREMITIES: No deformity, no edema  Lab Results: Results for orders placed or performed during the hospital encounter of 10/17/2017 (from the past 48 hour(s))  Culture, blood (routine x 2)     Status: None (Preliminary result)   Collection Time: 10/27/17  1:19 PM  Result Value Ref Range   Specimen Description BLOOD RIGHT HAND    Special Requests IN PEDIATRIC BOTTLE Blood Culture adequate volume    Culture NO GROWTH 1 DAY    Report Status PENDING   Culture, blood (routine x 2)     Status: None (Preliminary result)   Collection Time: 10/27/17  1:24 PM  Result Value Ref Range   Specimen Description BLOOD RIGHT HAND    Special Requests IN PEDIATRIC BOTTLE Blood Culture adequate volume    Culture NO GROWTH 1 DAY    Report Status PENDING   Glucose, capillary     Status: None   Collection Time: 10/27/17  2:28 PM  Result Value Ref Range   Glucose-Capillary 73 65 - 99 mg/dL   Comment 1 Notify RN    Comment 2 Document in Chart   Culture, Urine     Status: None   Collection Time: 10/27/17  4:44 PM  Result Value Ref Range   Specimen Description URINE, RANDOM    Special Requests NONE    Culture NO  GROWTH    Report Status 10/28/2017 FINAL   Glucose, capillary     Status: None   Collection Time: 10/27/17  8:38 PM  Result Value Ref Range   Glucose-Capillary 70 65 - 99 mg/dL  Glucose, capillary     Status: None   Collection Time: 10/28/17 12:00 AM  Result Value Ref Range   Glucose-Capillary 91 65 - 99 mg/dL  Glucose, capillary     Status: Abnormal   Collection Time: 10/28/17  3:50 AM  Result Value Ref Range   Glucose-Capillary 109 (H) 65 - 99 mg/dL  CBC     Status: Abnormal   Collection Time: 10/28/17  4:10 AM  Result Value Ref Range   WBC 18.3 (H) 4.0 - 10.5 K/uL   RBC 2.93 (L) 3.87 - 5.11 MIL/uL   Hemoglobin 8.6 (L) 12.0 - 15.0 g/dL   HCT 27.4 (L) 36.0 - 46.0 %   MCV 93.5 78.0 - 100.0 fL    Comment: DELTA CHECK NOTED REPEATED TO VERIFY    MCH 29.4 26.0 - 34.0 pg   MCHC 31.4 30.0 - 36.0 g/dL   RDW 21.1 (H) 11.5 - 15.5 %   Platelets 280 150 - 400 K/uL  Comprehensive metabolic panel     Status: Abnormal   Collection Time: 10/28/17  5:00 AM  Result Value Ref Range   Sodium  135 135 - 145 mmol/L   Potassium 4.5 3.5 - 5.1 mmol/L   Chloride 107 101 - 111 mmol/L   CO2 22 22 - 32 mmol/L   Glucose, Bld 110 (H) 65 - 99 mg/dL   BUN 18 6 - 20 mg/dL   Creatinine, Ser 0.87 0.44 - 1.00 mg/dL   Calcium 7.8 (L) 8.9 - 10.3 mg/dL   Total Protein 6.0 (L) 6.5 - 8.1 g/dL   Albumin 1.3 (L) 3.5 - 5.0 g/dL   AST 21 15 - 41 U/L   ALT 16 14 - 54 U/L   Alkaline Phosphatase 120 38 - 126 U/L   Total Bilirubin 0.3 0.3 - 1.2 mg/dL   GFR calc non Af Amer >60 >60 mL/min   GFR calc Af Amer >60 >60 mL/min    Comment: (NOTE) The eGFR has been calculated using the CKD EPI equation. This calculation has not been validated in all clinical situations. eGFR's persistently <60 mL/min signify possible Chronic Kidney Disease.    Anion gap 6 5 - 15  Magnesium     Status: None   Collection Time: 10/28/17  5:00 AM  Result Value Ref Range   Magnesium 1.9 1.7 - 2.4 mg/dL  Phosphorus     Status: None    Collection Time: 10/28/17  5:00 AM  Result Value Ref Range   Phosphorus 4.2 2.5 - 4.6 mg/dL  Glucose, capillary     Status: None   Collection Time: 10/28/17  2:51 PM  Result Value Ref Range   Glucose-Capillary 69 65 - 99 mg/dL  Glucose, capillary     Status: Abnormal   Collection Time: 10/29/17  1:08 AM  Result Value Ref Range   Glucose-Capillary 115 (H) 65 - 99 mg/dL  CBC     Status: Abnormal   Collection Time: 10/29/17  3:15 AM  Result Value Ref Range   WBC 20.6 (H) 4.0 - 10.5 K/uL   RBC 3.31 (L) 3.87 - 5.11 MIL/uL   Hemoglobin 8.9 (L) 12.0 - 15.0 g/dL   HCT 28.0 (L) 36.0 - 46.0 %   MCV 84.6 78.0 - 100.0 fL    Comment: DELTA CHECK NOTED REPEATED TO VERIFY    MCH 26.9 26.0 - 34.0 pg   MCHC 31.8 30.0 - 36.0 g/dL   RDW 19.1 (H) 11.5 - 15.5 %   Platelets 286 150 - 400 K/uL  Basic metabolic panel     Status: Abnormal   Collection Time: 10/29/17  3:15 AM  Result Value Ref Range   Sodium 135 135 - 145 mmol/L   Potassium 4.6 3.5 - 5.1 mmol/L   Chloride 106 101 - 111 mmol/L   CO2 22 22 - 32 mmol/L   Glucose, Bld 109 (H) 65 - 99 mg/dL   BUN 18 6 - 20 mg/dL   Creatinine, Ser 0.88 0.44 - 1.00 mg/dL   Calcium 8.2 (L) 8.9 - 10.3 mg/dL   GFR calc non Af Amer >60 >60 mL/min   GFR calc Af Amer >60 >60 mL/min    Comment: (NOTE) The eGFR has been calculated using the CKD EPI equation. This calculation has not been validated in all clinical situations. eGFR's persistently <60 mL/min signify possible Chronic Kidney Disease.    Anion gap 7 5 - 15  Glucose, capillary     Status: None   Collection Time: 10/29/17  6:45 AM  Result Value Ref Range   Glucose-Capillary 96 65 - 99 mg/dL   Comment 1 Notify RN  Comment 2 Document in Chart     Studies/Results: Ct Abdomen Pelvis W Contrast  Result Date: 10/28/2017 CLINICAL DATA:  Abdominal pain, nausea, and fever.  Crohn's disease. EXAM: CT ABDOMEN AND PELVIS WITH CONTRAST TECHNIQUE: Multidetector CT imaging of the abdomen and pelvis was  performed using the standard protocol following bolus administration of intravenous contrast. CONTRAST:  148m ISOVUE-300 IOPAMIDOL (ISOVUE-300) INJECTION 61% COMPARISON:  11/01/2017 FINDINGS: Lower Chest: Increased size of moderate right pleural effusion. Small left pleural effusion is decreased in size. Left chest tube is now seen, with distal tip seen in the inferior middle mediastinum adjacent to hiatal hernia. Previously seen inferior mediastinal fluid collection surrounding the distal esophagus and small hiatal hernia is nearly completely resolved since previous study. Persistent inflammatory changes seen in the inferior middle mediastinum adjacent to the hiatal hernia. Hepatobiliary: No hepatic masses identified. Prior cholecystectomy. No evidence of biliary obstruction. Pancreas:  No mass or inflammatory changes. Spleen: No evidence of splenomegaly. Small complex fluid collection seen along the posterosuperior margin the spleen measures 3.6 x 1.4 cm on image 20/3, without significant change compared to previous study. Adrenals/Urinary Tract: No masses identified. No evidence of hydronephrosis. Foley catheter seen within urinary bladder which is decompressed. Stomach/Bowel: Rectal tube seen in place. Postop changes from previous distal small bowel and right colon resection seen. Mild increase in moderate wall thickening is seen involving the distal ileum extending to the ileocolic anastomosis, with mild adjacent mesenteric inflammatory changes. Mild dilatation of small bowel loops proximal to this site noted within the pelvis. This is consistent with Crohn's disease with stricture and partial distal small bowel obstruction. No evidence of abscess. 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 Vascular/Lymphatic: No pathologically enlarged lymph nodes. No abdominal aortic aneurysm. Reproductive: Persistent rim enhancing fluid collection in pelvic cul-de-sac measuring 4.5 x 2.7 cm,  without significant change compared to prior exam. This is suspicious for abscess. Other:  None. Musculoskeletal:  No suspicious bone lesions identified. IMPRESSION: Near complete resolution of inferior mediastinal fluid collection/abscess surrounding the distal esophagus and small hiatal hernia and decreased small left pleural effusion, following chest tube placement. Increased moderate right pleural effusion and right basilar atelectasis. Mild increase in wall thickening and adjacent inflammatory changes involving the distal ileum, with mild dilatation of several small bowel loops proximal to this site. This is consistent with Crohn's disease and stricture formation. Stable small rim enhancing fluid collections along posterosuperior margin of the spleen and pelvic cul-de-sac, suspicious for abscesses. Left-sided transsphincteric perianal fistula. CONTRAST EXTRAVASATION CONSULTATION: Type of contrast:  Isovue 300 Site of extravasation: Upper right forearm Estimated volume of extravasation: 50 ml Area of extravasation scanned with CT? no PATIENT'S SIGNS AND SYMPTOMS Skin blistering/ulceration: no Decrease capillary refill: no Change in skin color: no Decreased motor function or severe tightness: no Decreased pulses distal to site of extravasation: no Altered sensation: no Increasing pain or signs of increased swelling during observation: no TREATMENT Observation period at site: 20 minutes Limb elevation: yes Ice packs applied: yes Heat pads applied: No Plastic surgery consulted? no DOCUMENTATION AND FOLLOW-UP Site contrast extravasation forms submitted? yes Post extravasation orders completed? yes Was additional follow up assigned to PA's? Yes Patient's questions answered? yes Patient instructed to call [phone] or seek immediate medical care if symptoms progress. Electronically Signed   By: JEarle GellM.D.   On: 10/28/2017 12:56   [place]  Result Date: 10/28/2017 CLINICAL DATA:  Urinary retention EXAM:  LIMITED RENAL ULTRASOUND COMPARISON:  None. FINDINGS: Right  Kidney: Length: 11.1 cm. Echogenicity and renal cortical thickness are within normal limits. No mass, perinephric fluid, or hydronephrosis visualized. No sonographically demonstrable calculus or ureterectasis. Left Kidney: Patient refused to allow imaging of left flank. Bladder: Decompressed with Foley catheter. IMPRESSION: Limited study as patient refused to allow imaging of the left flank. Right kidney appears unremarkable. Urinary bladder decompressed with Foley catheter and cannot be assessed. Electronically Signed   By: Lowella Grip III M.D.   On: 10/28/2017 13:32   Dg Chest Port 1 View  Result Date: 10/28/2017 CLINICAL DATA:  Mediastinal abscess EXAM: PORTABLE CHEST 1 VIEW COMPARISON:  10/27/2017 FINDINGS: Mediastinal drain on the left unchanged from the prior study. Bibasilar atelectasis unchanged. Small bilateral effusions unchanged. No pneumothorax Central line tips unchanged. One line in the SVC other in the azygos vein. IMPRESSION: Mediastinal drain unchanged in position. Bibasilar atelectasis/ infiltrate and small effusions unchanged. Electronically Signed   By: Franchot Gallo M.D.   On: 10/28/2017 08:54    Medications: I have reviewed the patient's current medications.  Assessment: 1. Fluid collection around spleen and pelvis suspicious for abscesses. 2. Crohn's disease, thickening of distal ileum, dilatation of several small loops, stricture formation, left-sided trans-sphincteric perianal fistula. 3. Mediastinal collection surrounding distal esophagus-near-complete resolution of chest tube placement   Plan: 1. Patient not a candidate for Crohn's therapy with steroids or biologics due to current medical status- ongoing infection and abscesses, leukocytosis, febrile 2. No evidence of small bowel obstruction, rectal tube contains liquid green stool. Abdomen is soft with normoactive bowel sounds on clinical exam. Reviewed  notes from Dr. Dalbert Batman and agree that placement of gastrostomy tube may not possible . Continue clear liquid, advance as tolerated . Otherwise, surgical placement of feeding jejunostomy may be the next viable option.    Ronnette Juniper 10/29/2017, 10:35 AM   Pager [phone] If no answer or after 5 PM call [phone]

## 2017-10-29 NOTE — Procedures (Signed)
CT guided placement of 10 Fr drain in anterior abdominal abscess.  Removed 4 ml of thick brown fluid.  Send fluid for culture.  Minimal blood loss and no immediate complication.

## 2017-10-29 NOTE — Progress Notes (Signed)
      [address]       [place] [address]             [phone]      Still feels poorly  BP (!) 146/80   Pulse (!) 102   Temp (!) 102.2 F (39 C)   Resp (!) 33   Ht 5' 4.5" (1.638 m)   Wt 123 lb 0.3 oz (55.8 kg)   SpO2 100%   BMI 20.79 kg/m    Intake/Output Summary (Last 24 hours) at 10/29/2017 0847 Last data filed at 10/29/2017 0815 Gross per 24 hour  Intake 1025.22 ml  Output 4050 ml  Net -3024.78 ml   Minimal output form mediastinal drain  On meropenem and Eraxis  CT yesterday showed drain in good position, still has phlegmon in mediastinum  Right effusion was a little bigger as well. May just be reactive, but might be worth getting a thoracentesis.  Would a surgical J tube be an option for nutrition?  Revonda Standard Roxan Hockey, MD [facility] [phone]

## 2017-10-29 NOTE — Progress Notes (Signed)
Patient had a 9 beat run of [facility] and is asymptomatic. VS are WNL. Dr Myna Hidalgo notified via text page

## 2017-10-29 NOTE — Progress Notes (Signed)
Daily Progress Note   Patient Name: Danielle [name]       Date: 10/29/2017 DOB: 06/02/1946  Age: 71 y.o. MRN#: [id number] Attending Physician: Elmarie Shiley, MD Primary Care Physician: Iona Beard, MD Admit Date: 10/15/2017  Reason for Consultation/Follow-up: Establishing goals of care  Subjective: Danielle [name] is lying in bed and alert. No family/visitors at bedside.    Length of Stay: 15  Current Medications: Scheduled Meds:  . acetaminophen  1,000 mg Oral Q6H   Or  . acetaminophen (TYLENOL) oral liquid 160 mg/5 mL  1,000 mg Oral Q6H  . feeding supplement (PRO-STAT SUGAR FREE 64)  30 mL Oral BID  . fentaNYL   Intravenous Q4H  . lip balm  1 application Topical BID  . pantoprazole  40 mg Intravenous Q12H  . promethazine  6.25 mg Intravenous TID AC    Continuous Infusions: . sodium chloride 50 mL/hr at 10/29/17 0857  . acetaminophen Stopped (10/29/17 0912)  . anidulafungin Stopped (10/28/17 1919)  . meropenem (MERREM) IV Stopped (10/29/17 0536)  . potassium chloride Stopped (10/22/17 1145)  . TPN CYCLIC-ADULT (ION) Stopped (10/29/17 1013)    PRN Meds: acetaminophen, diphenhydrAMINE **OR** diphenhydrAMINE, fentaNYL (SUBLIMAZE) injection, hydrocortisone, liver oil-zinc oxide, naloxone **AND** sodium chloride flush, nystatin, oxyCODONE, phenol, potassium chloride, prochlorperazine, promethazine, sodium chloride flush, traMADol  Physical Exam     Constitutional: She is oriented to person, place, and time. She has a sickly appearance.  Frail, malnourished  HENT:  Head: Normocephalic and atraumatic.  Cardiovascular: Regular rhythm. Tachycardia present.  Pulmonary/Chest: Effort normal. No accessory muscle usage. No tachypnea. No respiratory distress. She has decreased  breath sounds in the left lower field.  Lt CT in place  Abdominal: Soft. Normal appearance.  Neurological: She is alert and oriented to person, place, and time.  Psychiatric:  Tired, frustrated  Nursing note and vitals reviewed.    Vital Signs: BP (!) 146/80   Pulse (!) 102   Temp 99.8 F (37.7 C) (Rectal)   Resp (!) 33   Ht 5' 4.5" (1.638 m)   Wt 55.8 kg (123 lb 0.3 oz)   SpO2 100%   BMI 20.79 kg/m  SpO2: SpO2: 100 % O2 Device: O2 Device: Not Delivered O2 Flow Rate: O2 Flow Rate (L/min): 0.5 L/min  Intake/output summary:   Intake/Output Summary (  Last 24 hours) at 10/29/2017 1044 Last data filed at 10/29/2017 0815 Gross per 24 hour  Intake 1287.57 ml  Output 3550 ml  Net -2262.43 ml   LBM: Last BM Date: 10/28/17 Baseline Weight: Weight: 59 kg (130 lb) Most recent weight: Weight: 55.8 kg (123 lb 0.3 oz)       Palliative Assessment/Data: 20%    Flowsheet Rows     Most Recent Value  Intake Tab  Referral Department  Hospitalist  Unit at Time of Referral  Med/Surg Unit  Palliative Care Primary Diagnosis  Nephrology  Date Notified  10/26/17  Palliative Care Type  New Palliative care  Reason for referral  Clarify Goals of Care  Date of Admission  11/01/2017  Date first seen by Palliative Care  10/27/17  # of days Palliative referral response time  1 Day(s)  # of days IP prior to Palliative referral  12  Clinical Assessment  Psychosocial & Spiritual Assessment  Palliative Care Outcomes      Patient Active Problem List   Diagnosis Date Noted  . Goals of care, counseling/discussion   . Palliative care encounter   . Fever   . Intra-abdominal infection   . Hemoptysis   . Dehydration   . Acute embolism and thrombosis of left axillary vein (Danielle [name])   . Septic shock (Danielle [name]) 10/17/2017  . Patient's noncompliance with intervention / medical advice  10/17/2017  . 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 Pleural effusion, left 10/16/2017  . Mediastinal abscess (Danielle [name])  10/12/2017  . DVT (deep venous thrombosis) (Danielle [name])   . Metabolic acidosis   . Thrombocytosis ([place])   . Protein-calorie malnutrition, moderate (Danielle [name])   . Normochromic normocytic anemia   . SIRS (systemic inflammatory response syndrome) (Danielle [name]) 10/01/2017  . Pressure injury of skin 09/25/2017  . Hypoglycemia 09/25/2017  . Electrolyte imbalance 09/15/2017  . Anasarca 09/13/2017  . S/P thoracentesis 09/11/2017  . Peritonitis (Danielle [name]) 09/11/2017  . Fungemia 09/11/2017  . Gastrostomy tube in place ([place]) 09/08/2017  . Protein-calorie malnutrition, severe 06/10/2017  . Vomiting in adult patient 06/08/2017  . 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 Hypotension 05/07/2017  . UTI (urinary tract infection) 05/07/2017  . Chest tube in place   . Crohn disease (Cherry [name]) 02/19/2017  . Immunosuppressed status (Danielle [name]) 12/09/2015  . Hiatal hernia 12/07/2015  . Acute renal failure superimposed on stage 3 chronic kidney disease (Danielle [name]) 12/06/2015  . Perianal Crohn's disease, with fistula (Danielle [name])   . Hypomagnesemia   . Leukocytosis 11/06/2011    Palliative Care Assessment & Plan   HPI: 71 y.o. female  with past medical history of Crohn's disease, incarcerated hiatal hernia, CKD stage 3, diastolic heart failure, SBO, left buttock abscess, psoriasis, colostomy with take down ~1 month 2009. Admitted on 11/03/2017 with nausea and vomiting. Has had complicated history with hernia operation 85/2 and stay complicated by abd pain and G tube leaking requiring reoperation, purulent peritonitis, requiring intubation and vasopressors, ileus, fungemia, pelvic fluid collection drained 10/3-10/21 but discharged to home being able to walk and tolerated diet. Readmitted 10/26-11/1 with fever felt to be related to dehydration and post infection process followed by ID and with RUE DVT. Currently ID following for treatment of infection and monitoring blood cultures and fluid collections. Had thoracentesis 11/12 and VATs for mediastinum abscess  11/15. She is severely malnourished (on TPN) and deconditioned with poorly controlled infection with continued high fevers and nausea. Recommendation to stop TPN to better control infection but intake is very poor. IR consulted and request  CT abd to assess for feeding tube placement. Palliative requested to assist with GOC.    Assessment: Danielle [name] asks questions about her procedure today. I explained that we do not know what time this will be done but should be done today. Still struggling with fever and complains of being cold (from cooling blanket) and still complaining of nausea. She has not received any phenergan and is requesting (spoke with RN to provide). Continue to monitor QTc closely. She agrees to procedure and understands rationale for necessity. Seems forgetful and needs constant reminding. We have discussed everyday how/why she is so sick with infection. Struggles to grasp poor prognosis and her sister is very optimistic. They continue to request full aggressive care including full code EXCEPT for no feeding tubes (she refuses). Continue with current care. I will f/u Monday - please call for any palliative needs over the weekend.   Recommendations/Plan:  Nausea/vomiting: Zofran gives her headaches -try and avoid, I d/c'd. Phenergan scheduled TID with meals 6.25 mg IV. Phenergan 6.25 mg IV every 6 hours prn. Phenergan was increased and QTc is rising which is why I had ordered such a small dose. Decreased dose again and RN monitoring closely QTc and efficacy. Will also add prn Compazine which may have less effect on QTc but also monitor closely. Monitor QTc and call/hold antiemetics for QTc >0.42. Could consider low dose Ativan for nausea if QTc restricting other antiemetics BUT I worry about confusion/delirium and sedation with this.   Malnutrition: Plans to d/c TPN. Ms. Kruser only likes berry Breeze and avoiding red colored liquids to monitor bleeding. Agree to continue clear liquids.  Spoke with nutrition about Ensure clear and we do not stock in hospital but Cheri Rous will bring some. Still not tolerating intake and doubt this will change. Feeding tube a poor option and she also refuses anyway (even temporary Cortrak). Continuing TPN for now.   Sacrum/buttocks breakdown: Desitin ordered as family says this worked best in the past. Cass consulted for further recommendations.    Goals of Care and Additional Recommendations:  Limitations on Scope of Treatment: Full Scope Treatment, refuses feeding tube  Code Status:  Full code  Prognosis:   Very poor with severe deconditioning and extensive infection burden. Unfortunately not really making any progress.   Discharge Planning:  To Be Determined   Thank you for allowing the Palliative Medicine Team to assist in the care of this patient.   Total Time 25 min Prolonged Time Billed  no       Greater than 50%  of this time was spent counseling and coordinating care related to the above assessment and plan.  Vinie Sill, NP Palliative Medicine Team Pager # [phone] (M-F 8a-5p) Team Phone # [phone] (Nights/Weekends)

## 2017-10-29 NOTE — Consult Note (Signed)
[place] nurse attempted to assess sacrum/buttocks per consult request.  Patient is in IR for procedure.  I will contact bedside nursing staff to discuss skin and wound and update orders accordingly.    Thanks  Arlis Yale [employer], RN,CWOCN, CNS, [place] [phone])

## 2017-10-29 NOTE — Progress Notes (Signed)
[place] NOTE   Pharmacy Consult for TPN Indication: Poor PO intake / unable to place repeat G-tube   Patient Measurements: Height: 5' 4.5" (163.8 cm) Weight: 123 lb 0.3 oz (55.8 kg) IBW/kg (Calculated) : 55.85 TPN AdjBW (KG): 59 Body mass index is 20.79 kg/m.  Assessment:  33 yoF re-admit 11/8 with N/V, loose stools (baseline w/Crohn's). Was hospitalized for a month in October and developed fungemia and treated with Invanz and anidulafungin. Re-admitted a week later with intermittent fevers and difficultly swallowing. CT chest showed recurrent mediastinal fluid abscess.  PMH: Crohns, HH, hx SBO  Significant events:  11/15 Left VATs to drain mediastinum   GI: Mediastinal abscess complex, had been advanced to full liquid diet but has nausea and vomiting with almost anything PO. Had 4 episodes of emesis earlier this week but volume not charted. Clear liquids for now. Patient has poor appetite and still experiencing nausea with any type of PO intake. Phenergan low dose d/t Qtc TID. Avoiding Zofran. Adding prn Compazine. GI recommending possible Gtube placement but PO intake would be best. Patient refuses feeding tube. 11/22 Repeat CT - mediastinal fluid collection nearly resolved. Plan IR guided perihepatic abscess aspiration and drain placement 11/23. GOC being assessed daily. Albumin low at 1.3. Last BM was 11/22.  Endo: CBGs controlled. 69-73 off TPN and 100-110s on cycle. Sliding scale insulin stopped on 11/21. Insulin requirements in the past 24 hours: 0 unit regular insulin  Lytes: K starting to rise at 4.6-monitor. Others wnl. CoCa 10.4. Renal: SCr stable, UOP 2.2 mL/kg/hr. Net positive this admit but I/Os not all charted. I/O - 2L documented last 24 hours. IVF- NS @ 50 mL/hr started by MD 11/23. Discussed with Dr. Tyrell Antonio - will continue this for 7 hrs only for total of 2L today. Pulm: RA; Bilateral chest tube output at 5 mL / 24 hours, chest  tubes to remain in place for several days. R-effusion larger 11/23 - considering thoracentesis. Cards: BP mostly controlled, HR 90-110s Hepatobil: AST/ALT and Tbili wnl. Trig 83 ID: Changed Zosyn to [facility] 11/21, continues on anidulafungin for mediastinal abscess. Tmax of 102.2, WBC back up at 20.6. Drain in place. Cultures negative so far. Repeating Cdiff with inflammatory changes on CT abdomen.   Best Practices: SCDs, PPI IV TPN Access: PICC 11/11>> TPN start date: 11/11>>  Nutritional Goals (per RD recommendation on 11/19): Kcal:  1500-1700 Protein:  75-90 grams  Fluid:  >/= 1.5 L/day  Current Nutrition:  Clear liquid (appetite poor) Custom TPN  Plan:  Continue cyclic TPN at 18 hr cycle. Infuse 1,680 mL over 18 hrs : 49 mL/hr x 1 hr, then 99 mL/hr x 14 hrs, then 49 mL/hr x 1 hr.  TPN provides 84 g of protein, 235 g of dextrose, and 50 g lipids providing 1,640 total kcal, which meets ~100% of patient needs.  Electrolytes in TPN: continue max acetate AddMVI,trace elements in TPN Continue CBG checks for hypoglycemic events Monitor TPN labs, clinical progress  F/U ability to tolerate PO diet, GOC    Sloan Leiter, PharmD, BCPS, BCCCP Clinical Pharmacist Clinical phone 10/29/2017 until 3:30PM - #[phone] After hours, please call #[phone] 10/29/2017 7:15 AM

## 2017-10-29 NOTE — Progress Notes (Signed)
PROGRESS NOTE    Danielle [name]  UTM:[id number] DOB: Jun 08, 1946 DOA: 11/03/2017 PCP: Iona Beard, MD    Brief Narrative: 71 y.o.F w/ a Hxof Crohn's disease, hiatal hernia,psoriasis, and SBO who underwent a type IV hiatal hernia repair in October 2018.Postoperatively the patient required endotracheal intubation, mechanical ventilation and the use of pressors. She was extubated on postop day #2. She improved clinically and was on parenteral nutrition, however she developed a pelvic fluid collection which required a drain. On 09/11/2017 she had blood cultures positive for Candida glabrata, and the pelvic abscess culture was positive for C. Albicans and C. Glabrata. She was given treatment withparenteral antifungals. She subsequently improved, was able to ambulate with physical therapy, weaned off of parenteral nutrition and was tolerating oral diet. ID recommended to continue Invanz andErasisfor 2 weeks. A PICC line was placed in her right arm and she was ultimately discharged home.  She returned on 10/01/2017 due to intermittent fevers. She was treated empirically and clinically improved to the point that she was able to be d/c home again on 11/1.  She returned to the ED 11/8 with complaints of persistent nausea associated with emesis. CT abdomen pelvis showed re accumulation of fluid collection in the lower mediastinum, bibasilar pleural effusion, another small complex collection along the posterior margin of the spleen. Patient underwent left video assisted thoracoscopy for mediastinal drainage of mediastinal abscess. ID has been helping with management of IV antibiotics.   Patient continue to spike fevers, persistent nausea and vomiting. She has been on TPN, which was discontinue due to persistent fevers.   Palliative care team met with patient and her family. Patients wants to continue with aggressive care.    Assessment & Plan:   Principal Problem:   Septic shock  ([place]) Active Problems:   Leukocytosis   Perianal Crohn's disease, with fistula ([place])   Acute renal failure superimposed on stage 3 chronic kidney disease (HCC)   Hiatal hernia   Immunosuppressed status (HCC)   Chest tube in place   Hypotension   Vomiting in adult patient   S/P thoracentesis   Anasarca   DVT (deep venous thrombosis) (HCC)   Protein-calorie malnutrition, moderate (HCC)   Mediastinal abscess (HCC)   Mediastinal post-op seromas   Pleural effusion, left   Patient's noncompliance with intervention / medical advice    Dehydration   Acute embolism and thrombosis of left axillary vein (HCC)   Goals of care, counseling/discussion   Palliative care encounter   Fever   Intra-abdominal infection   Hemoptysis   SevereSepsis/Mediastinal abscess; Patient underwent esophagogram showing 2.5 cm area of extrinsic compression upon esophagus, underwent also thoracentesis. Case was reviewed by IR and gastroenterologist, recommendation was for CVTS consult. Patient underwent VAST drainage 11-15. S/P VATS drainage on 11/15. Patient continue to spike fever, leukocytosis. .  Repeated CT scan 11-19; Ill-defined multiloculated rim enhancing fluid collection and extensive edema in posterior mediastinum inferior to mediastinal drain and anterior to aorta, probably representing the residual of drained abscess. Discrete 14 mm abscess present in the right posterior mediastinum. Multiloculated rim enhancing fluid collection surrounding the spleen and extending into left pericolic gutter, likely abscess. Small bilateral pleural effusions with left pleural drain. Stable dependent lower lobe consolidations may represent atelectasis or pneumonia. -Per CVTS mediastinal drain in good position, still has phlegmon in the area.  -CT abdomen pelvis; near complete resolution of mediastinal abscess. Stable small rim enhancing fluid collections along posterosuperior margin of the spleen and pelvic cul-de-sac,  suspicious for  abscesses. -IV meropenem and Eraxis.   Known candida galbatra infection; on Eraxis. ID following   Type IV hiatal hernia repair in October 2018. Post operative pelvic abscess;  CT chest 11-19. For Multiloculated rim enhancing fluid collection surrounding the spleen and extending into left pericolic gutter, likely abscess. -spiking fever, leukocytosis.  -CT abdomen and pelvis;  near complete resolution of mediastinal abscess. Stable small rim enhancing fluid collections along posterosuperior margin of the spleen and pelvic cul-de-sac, suspicious for abscesses. Plan for IR abscess aspiration, possible drain placement  History of Crohns/ chronic diarrhea  C diff 11-10 was negative.  CT abdomen with Mild increase in wall thickening and adjacent inflammatory changes involving the distal ileum, with mild dilatation of several small bowel loops proximal to this site. This is consistent with Crohn's disease and stricture formation. -Await GI recommendation regarding CT scan.  repeat C diff   Urinary Retention; continue with foley catheter; will need voiding trial.   Nutrition;  Nutrition started on TPN.  Patient with a recent fungemia with candida glabrata and albicans.  Per Dr Fredderick Erb ok to continue with TPN for now.  Per Dr Rosendo Gros; oral trial. He doubt G tube will be stable.    Anemia; no further hematemesis.  Hb slowly trending down. Monitor daily   Chronic diastolic CHF Compensated.   Recent Hematemesis/hemoptysis -stopped IV heparin  -lateral upper extremity DVT 09/2017.  -positive DVT in the left radial vein at this time -no recurrent hematemesis or hemoptysis. IV Protonix drip -CT scan of the chest shows evidence of esophageal wall thickening consistent with esophagitis -GI following.   RUE DVT; holding heparin due to hematemesis. When hb stable plan to resume heparin if clear by GI.       DVT prophylaxis: SCD.  Code Status: full code.  Family  Communication: care discussed with patient.  Disposition Plan: remain in patient for IV antibiotics.    Consultants:   General surgery  CVTS  GI   Procedures: events; Significant Events: 11/8 readmit 11/11 PICC placed 11/12 thoracentesis 11/5 to OR at [facility] for L VATS for mediastinal drainage     Antimicrobials; antifungal  Erasix 10-16-2017  Meropenem 10-27-2017     Subjective: She has tremor, chills, having fevers.  Denies abdominal pain.    Objective: Vitals:   10/29/17 0315 10/29/17 0700 10/29/17 0800 10/29/17 0806  BP:  (!) 146/80    Pulse:      Resp: (!) 33 (!) 34  (!) 33  Temp:   (!) 102.2 F (39 C)   TempSrc:      SpO2: 98%  97% 100%  Weight:      Height:        Intake/Output Summary (Last 24 hours) at 10/29/2017 0819 Last data filed at 10/29/2017 0815 Gross per 24 hour  Intake 1025.22 ml  Output 4055 ml  Net -3029.78 ml   Filed Weights   10/15/17 1449 10/20/17 1600 10/22/2017 0500  Weight: 55.9 kg (123 lb 3.8 oz) 55.3 kg (121 lb 14.6 oz) 55.8 kg (123 lb 0.3 oz)    Examination:  General exam: Tremors, NAD Respiratory system; tachypnea, from fever, normal respiratory effort.  Cardiovascular system: S 1, S 2 RRR, no edema Gastrointestinal system: abdomen soft, BS, mildline wound cover Central nervous system: alert, non focal.  Extremities: Symmetric 5 x 5 power.   Data Reviewed: I have personally reviewed following labs and imaging studies  CBC: Recent Labs  Lab 10/25/17 0005 10/25/17 2045 10/26/17 0323 10/27/17 0500 10/28/17  0410 10/29/17 0315  WBC 24.9* 31.8* 23.9* 19.5* 18.3* 20.6*  NEUTROABS 18.2*  --  17.0* 13.1*  --   --   HGB 9.6* 10.5* 9.2* 8.9* 8.6* 8.9*  HCT 29.5* 32.0* 28.4* 28.1* 27.4* 28.0*  MCV 84.0 83.3 84.3 85.4 93.5 84.6  PLT 274 291 253 266 280 353   Basic Metabolic Panel: Recent Labs  Lab 10/23/17 0513 10/24/17 0006 10/25/17 0005 10/26/17 0323 10/27/17 0500 10/28/17 0500 10/29/17 0315  NA 135  135 135 135 134* 135 135  K 3.9 4.2 3.5 3.8 4.5 4.5 4.6  CL 109 109 109 106 106 107 106  CO2 18* 18* 20* 21* _0 GLUCOSE 85 91 124* 114* 116* 110* 109*  BUN _1 CREATININE 0.80 0.88 0.84 0.86 0.85 0.87 0.88  CALCIUM 7.7* 7.9* 7.7* 7.9* 7.9* 7.8* 8.2*  MG 2.1 2.0 1.9  --   --  1.9  --   PHOS 3.2  --  3.4  --   --  4.2  --    GFR: Estimated Creatinine Clearance: 53.1 mL/min (by C-G formula based on SCr of 0.88 mg/dL). Liver Function Tests: Recent Labs  Lab 10/23/17 0513 10/25/17 0005 10/28/17 0500  AST 28 32 21  ALT _2 ALKPHOS 111 145* 120  BILITOT 0.4 0.3 0.3  PROT 6.9 6.0* 6.0*  ALBUMIN 1.6* 1.5* 1.3*   No results for input(s): LIPASE, AMYLASE in the last 168 hours. No results for input(s): AMMONIA in the last 168 hours. Coagulation Profile: No results for input(s): INR, PROTIME in the last 168 hours. Cardiac Enzymes: No results for input(s): CKTOTAL, CKMB, CKMBINDEX, TROPONINI in the last 168 hours. BNP (last 3 results) No results for input(s): PROBNP in the last 8760 hours. HbA1C: No results for input(s): HGBA1C in the last 72 hours. CBG: Recent Labs  Lab 10/28/17 0000 10/28/17 0350 10/28/17 1451 10/29/17 0108 10/29/17 0645  GLUCAP 91 109* 69 115* 96   Lipid Profile: No results for input(s): CHOL, HDL, LDLCALC, TRIG, CHOLHDL, LDLDIRECT in the last 72 hours. Thyroid Function Tests: No results for input(s): TSH, T4TOTAL, FREET4, T3FREE, THYROIDAB in the last 72 hours. Anemia Panel: No results for input(s): VITAMINB12, FOLATE, FERRITIN, TIBC, IRON, RETICCTPCT in the last 72 hours. Sepsis Labs: Recent Labs  Lab 10/26/17 0323  LATICACIDVEN 1.0    Recent Results (from the past 240 hour(s))  Culture, blood (routine x 2)     Status: None   Collection Time: 10/19/17  9:45 AM  Result Value Ref Range Status   Specimen Description BLOOD BLOOD RIGHT FOREARM  Final   Special Requests IN PEDIATRIC BOTTLE Blood Culture adequate volume   Final   Culture   Final    NO GROWTH 5 DAYS Performed at [facility], [place] [address] [place], [place] [address]    Report Status 10/24/2017 FINAL  Final  Culture, blood (routine x 2)     Status: None   Collection Time: 10/19/17  9:48 AM  Result Value Ref Range Status   Specimen Description BLOOD RIGHT HAND  Final   Special Requests IN PEDIATRIC BOTTLE Blood Culture adequate volume  Final   Culture   Final    NO GROWTH 5 DAYS Performed at [facility], [place] [address], [place], [place] [address]    Report Status 10/24/2017 FINAL  Final  Culture, Urine     Status: None   Collection Time: 10/20/17  5:06 PM  Result Value Ref Range Status   Specimen Description URINE, CATHETERIZED  Final   Special Requests NONE  Final   Culture   Final    NO GROWTH Performed at [facility], [address] [place], [place] [address]    Report Status 10/22/2017 FINAL  Final  Surgical PCR screen     Status: None   Collection Time: 10/23/2017  3:21 AM  Result Value Ref Range Status   MRSA, PCR NEGATIVE NEGATIVE Final   Staphylococcus aureus NEGATIVE NEGATIVE Final    Comment: (NOTE) The Xpert SA Assay (FDA approved for NASAL specimens in patients 35 years of age and older), is one component of a comprehensive surveillance program. It is not intended to diagnose infection nor to guide or monitor treatment.   Body fluid culture     Status: None   Collection Time: 10/11/2017  9:02 AM  Result Value Ref Range Status   Specimen Description FLUID  Final   Special Requests MEDISTINAL PATIENT ON FOLLOWING ZOSYN AND VANC  Final   Gram Stain   Final    MODERATE WBC PRESENT,BOTH PMN AND MONONUCLEAR NO ORGANISMS SEEN    Culture NO GROWTH 3 DAYS  Final   Report Status 10/24/2017 FINAL  Final  Body fluid culture     Status: None   Collection Time: 11/04/2017  9:02 AM  Result Value Ref Range Status   Specimen Description FLUID PLEURAL LEFT  Final   Special Requests PATIENT ON FOLLOWING ZOSYN  AND VANC  Final   Gram Stain   Final    MODERATE WBC PRESENT, PREDOMINANTLY MONONUCLEAR NO ORGANISMS SEEN    Culture NO GROWTH 3 DAYS  Final   Report Status 10/24/2017 FINAL  Final  Fungus Culture With Stain     Status: None (Preliminary result)   Collection Time: 10/08/2017  9:02 AM  Result Value Ref Range Status   Fungus Stain Final report  Final    Comment: (NOTE) Performed At: [facility] [place], [place] [id number] Rush Farmer MD [id number]    Fungus (Mycology) Culture PENDING  Incomplete   Fungal Source FLUID  Final    Comment: PLEURAL LEFT PATIENT ON FOLLOWING ZOSYN AND VANC   Acid Fast Smear (AFB)     Status: None   Collection Time: 11/01/2017  9:02 AM  Result Value Ref Range Status   AFB Specimen Processing Concentration  Final   Acid Fast Smear Negative  Final    Comment: (NOTE) Performed At: [facility] [place], [place] [id number] Rush Farmer MD [id number]    Source (AFB) FLUID  Final    Comment: PLEURAL LEFT PATIENT ON FOLLOWING ZOSYN AND VANC   Fungus Culture Result     Status: None   Collection Time: 10/19/2017  9:02 AM  Result Value Ref Range Status   Result 1 Comment  Final    Comment: (NOTE) KOH/Calcofluor preparation:  no fungus observed. Performed At: [facility] [place], [place] [id number] Rush Farmer MD [id number]   Fungus Culture With Stain     Status: None (Preliminary result)   Collection Time: 10/19/2017  9:42 AM  Result Value Ref Range Status   Fungus Stain Final report  Final    Comment: (NOTE) Performed At: [facility] [place], [place] [id number] Rush Farmer MD [id number]    Fungus (Mycology) Culture PENDING  Incomplete   Fungal Source FLUID  Final    Comment:  MEDISTINAL PATIENT ON FOLLOWING ZOSYN AND VANC    Acid Fast Smear (AFB)     Status: None   Collection Time: 11/02/2017  9:42 AM  Result Value Ref Range Status    AFB Specimen Processing Concentration  Final   Acid Fast Smear Negative  Final    Comment: (NOTE) Performed At: [facility] [place], [place] [id number] Rush Farmer MD [id number]    Source (AFB) FLUID  Final    Comment: MEDISTINAL PATIENT ON FOLLOWING ZOSYN AND VANC   Fungus Culture Result     Status: None   Collection Time: 11/04/2017  9:42 AM  Result Value Ref Range Status   Result 1 Comment  Final    Comment: (NOTE) KOH/Calcofluor preparation:  no fungus observed. Performed At: [facility], [place] [id number] Rush Farmer MD EV:[id number]   Culture, blood (routine x 2)     Status: None (Preliminary result)   Collection Time: 10/24/17 12:06 AM  Result Value Ref Range Status   Specimen Description BLOOD LEFT FOOT  Final   Special Requests   Final    BOTTLES DRAWN AEROBIC AND ANAEROBIC Blood Culture adequate volume   Culture NO GROWTH 4 DAYS  Final   Report Status PENDING  Incomplete  Culture, blood (routine x 2)     Status: None (Preliminary result)   Collection Time: 10/24/17 12:16 AM  Result Value Ref Range Status   Specimen Description BLOOD RIGHT FOOT  Final   Special Requests   Final    BOTTLES DRAWN AEROBIC ONLY Blood Culture results may not be optimal due to an inadequate volume of blood received in culture bottles   Culture NO GROWTH 4 DAYS  Final   Report Status PENDING  Incomplete  Fungus culture, blood     Status: None (Preliminary result)   Collection Time: 10/24/17 12:16 AM  Result Value Ref Range Status   Specimen Description BLOOD LEFT FOOT  Final   Special Requests   Final    BOTTLES DRAWN AEROBIC AND ANAEROBIC Blood Culture adequate volume   Culture NO GROWTH 4 DAYS  Final   Report Status PENDING  Incomplete  Culture, blood (routine x 2)     Status: None (Preliminary result)   Collection Time: 10/24/17  3:50 PM  Result Value Ref Range Status   Specimen Description BLOOD RIGHT FOOT  Final    Special Requests IN PEDIATRIC BOTTLE Blood Culture adequate volume  Final   Culture NO GROWTH 4 DAYS  Final   Report Status PENDING  Incomplete  Culture, blood (routine x 2)     Status: None (Preliminary result)   Collection Time: 10/27/17  1:19 PM  Result Value Ref Range Status   Specimen Description BLOOD RIGHT HAND  Final   Special Requests IN PEDIATRIC BOTTLE Blood Culture adequate volume  Final   Culture NO GROWTH 1 DAY  Final   Report Status PENDING  Incomplete  Culture, blood (routine x 2)     Status: None (Preliminary result)   Collection Time: 10/27/17  1:24 PM  Result Value Ref Range Status   Specimen Description BLOOD RIGHT HAND  Final   Special Requests IN PEDIATRIC BOTTLE Blood Culture adequate volume  Final   Culture NO GROWTH 1 DAY  Final   Report Status PENDING  Incomplete  Culture, Urine     Status: None   Collection Time: 10/27/17  4:44 PM  Result Value Ref Range Status   Specimen Description URINE,  RANDOM  Final   Special Requests NONE  Final   Culture NO GROWTH  Final   Report Status 10/28/2017 FINAL  Final         Radiology Studies: Ct Abdomen Pelvis W Contrast  Result Date: 10/28/2017 CLINICAL DATA:  Abdominal pain, nausea, and fever.  Crohn's disease. EXAM: CT ABDOMEN AND PELVIS WITH CONTRAST TECHNIQUE: Multidetector CT imaging of the abdomen and pelvis was performed using the standard protocol following bolus administration of intravenous contrast. CONTRAST:  169m ISOVUE-300 IOPAMIDOL (ISOVUE-300) INJECTION 61% COMPARISON:  10/26/2017 FINDINGS: Lower Chest: Increased size of moderate right pleural effusion. Small left pleural effusion is decreased in size. Left chest tube is now seen, with distal tip seen in the inferior middle mediastinum adjacent to hiatal hernia. Previously seen inferior mediastinal fluid collection surrounding the distal esophagus and small hiatal hernia is nearly completely resolved since previous study. Persistent inflammatory  changes seen in the inferior middle mediastinum adjacent to the hiatal hernia. Hepatobiliary: No hepatic masses identified. Prior cholecystectomy. No evidence of biliary obstruction. Pancreas:  No mass or inflammatory changes. Spleen: No evidence of splenomegaly. Small complex fluid collection seen along the posterosuperior margin the spleen measures 3.6 x 1.4 cm on image 20/3, without significant change compared to previous study. Adrenals/Urinary Tract: No masses identified. No evidence of hydronephrosis. Foley catheter seen within urinary bladder which is decompressed. Stomach/Bowel: Rectal tube seen in place. Postop changes from previous distal small bowel and right colon resection seen. Mild increase in moderate wall thickening is seen involving the distal ileum extending to the ileocolic anastomosis, with mild adjacent mesenteric inflammatory changes. Mild dilatation of small bowel loops proximal to this site noted within the pelvis. This is consistent with Crohn's disease with stricture and partial distal small bowel obstruction. No evidence of abscess. 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 Vascular/Lymphatic: No pathologically enlarged lymph nodes. No abdominal aortic aneurysm. Reproductive: Persistent rim enhancing fluid collection in pelvic cul-de-sac measuring 4.5 x 2.7 cm, without significant change compared to prior exam. This is suspicious for abscess. Other:  None. Musculoskeletal:  No suspicious bone lesions identified. IMPRESSION: Near complete resolution of inferior mediastinal fluid collection/abscess surrounding the distal esophagus and small hiatal hernia and decreased small left pleural effusion, following chest tube placement. Increased moderate right pleural effusion and right basilar atelectasis. Mild increase in wall thickening and adjacent inflammatory changes involving the distal ileum, with mild dilatation of several small bowel loops proximal to this site.  This is consistent with Crohn's disease and stricture formation. Stable small rim enhancing fluid collections along posterosuperior margin of the spleen and pelvic cul-de-sac, suspicious for abscesses. Left-sided transsphincteric perianal fistula. CONTRAST EXTRAVASATION CONSULTATION: Type of contrast:  Isovue 300 Site of extravasation: Upper right forearm Estimated volume of extravasation: 50 ml Area of extravasation scanned with CT? no PATIENT'S SIGNS AND SYMPTOMS Skin blistering/ulceration: no Decrease capillary refill: no Change in skin color: no Decreased motor function or severe tightness: no Decreased pulses distal to site of extravasation: no Altered sensation: no Increasing pain or signs of increased swelling during observation: no TREATMENT Observation period at site: 20 minutes Limb elevation: yes Ice packs applied: yes Heat pads applied: No Plastic surgery consulted? no DOCUMENTATION AND FOLLOW-UP Site contrast extravasation forms submitted? yes Post extravasation orders completed? yes Was additional follow up assigned to PA's? Yes Patient's questions answered? yes Patient instructed to call [phone] or seek immediate medical care if symptoms progress. Electronically Signed   By: JEarle GellM.D.   On:  10/28/2017 12:56   US Renal  Result Date: 10/28/2017 CLINICAL DATA:  Urinary retention EXAM: LIMITED RENAL ULTRASOUND COMPARISON:  None. FINDINGS: Right Kidney: Length: 11.1 cm. Echogenicity and renal cortical thickness are within normal limits. No mass, perinephric fluid, or hydronephrosis visualized. No sonographically demonstrable calculus or ureterectasis. Left Kidney: Patient refused to allow imaging of left flank. Bladder: Decompressed with Foley catheter. IMPRESSION: Limited study as patient refused to allow imaging of the left flank. Right kidney appears unremarkable. Urinary bladder decompressed with Foley catheter and cannot be assessed. Electronically Signed   By: Lowella Grip III  M.D.   On: 10/28/2017 13:32   Dg Chest Port 1 View  Result Date: 10/28/2017 CLINICAL DATA:  Mediastinal abscess EXAM: PORTABLE CHEST 1 VIEW COMPARISON:  10/27/2017 FINDINGS: Mediastinal drain on the left unchanged from the prior study. Bibasilar atelectasis unchanged. Small bilateral effusions unchanged. No pneumothorax Central line tips unchanged. One line in the SVC other in the azygos vein. IMPRESSION: Mediastinal drain unchanged in position. Bibasilar atelectasis/ infiltrate and small effusions unchanged. Electronically Signed   By: Franchot Gallo M.D.   On: 10/28/2017 08:54   Dg Chest Port 1 View  Result Date: 10/27/2017 CLINICAL DATA:  Fever EXAM: PORTABLE CHEST 1 VIEW COMPARISON:  10/24/2017 FINDINGS: Left dialysis catheter and PICC line remain in place, unchanged. PICC line courses superiorly, unchanged. Interval removal of left chest tube. No pneumothorax. Mild cardiomegaly. Bilateral perihilar and lower lobe opacities, similar to prior study. IMPRESSION: Interval removal of left chest tube without pneumothorax. Otherwise no significant change. Electronically Signed   By: Rolm Baptise M.D.   On: 10/27/2017 09:45        Scheduled Meds: . acetaminophen  1,000 mg Oral Q6H   Or  . acetaminophen (TYLENOL) oral liquid 160 mg/5 mL  1,000 mg Oral Q6H  . feeding supplement (PRO-STAT SUGAR FREE 64)  30 mL Oral BID  . fentaNYL   Intravenous Q4H  . lip balm  1 application Topical BID  . pantoprazole  40 mg Intravenous Q12H  . promethazine  6.25 mg Intravenous TID AC   Continuous Infusions: . acetaminophen    . anidulafungin Stopped (10/28/17 1919)  . meropenem (MERREM) IV Stopped (10/29/17 0536)  . potassium chloride Stopped (10/22/17 1145)  . TPN CYCLIC-ADULT (ION) 99 mL/hr at 10/28/17 1853     LOS: 15 days    Time spent: 35 minutes.     Elmarie Shiley, MD [facility] Pager [phone]  If 7PM-7AM, please contact night-coverage [email or web address] Password  [facility] 10/29/2017, 8:19 AM

## 2017-10-29 NOTE — Progress Notes (Signed)
8 Days Post-Op  Subjective: Awake and alert.  Family members present. Denies respiratory distress Denies pain Main complaint is chills. Still with fevers up to 102.2.  Hemodynamically stable.  WBC 20,600.  Objective: Vital signs in last 24 hours: Temp:  [98 F (36.7 C)-102.2 F (39 C)] 102.2 F (39 C) (11/23 0800) Pulse Rate:  [97-113] 102 (11/23 0300) Resp:  [22-34] 33 (11/23 0806) BP: (100-146)/(56-80) 146/80 (11/23 0700) SpO2:  [85 %-100 %] 100 % (11/23 0806) Last BM Date: 10/28/17  Intake/Output from previous day: 11/22 0701 - 11/23 0700 In: 1025.2 [I.V.:345.2; IV Piggyback:680] Out: 2980 [Urine:2975; Chest Tube:5] Intake/Output this shift: Total I/O In: -  Out: 1400 [Urine:1400]  General appearance: Alert.  Mental status normal.  Family members present.  Does not appear toxic Resp: Slightly to.  No increased work of breathing GI: Soft.  Nontender.  Nondistended.  Midline wound clean  Lab Results:  Results for orders placed or performed during the hospital encounter of 10/29/2017 (from the past 24 hour(s))  Glucose, capillary     Status: None   Collection Time: 10/28/17  2:51 PM  Result Value Ref Range   Glucose-Capillary 69 65 - 99 mg/dL  Glucose, capillary     Status: Abnormal   Collection Time: 10/29/17  1:08 AM  Result Value Ref Range   Glucose-Capillary 115 (H) 65 - 99 mg/dL  CBC     Status: Abnormal   Collection Time: 10/29/17  3:15 AM  Result Value Ref Range   WBC 20.6 (H) 4.0 - 10.5 K/uL   RBC 3.31 (L) 3.87 - 5.11 MIL/uL   Hemoglobin 8.9 (L) 12.0 - 15.0 g/dL   HCT 28.0 (L) 36.0 - 46.0 %   MCV 84.6 78.0 - 100.0 fL   MCH 26.9 26.0 - 34.0 pg   MCHC 31.8 30.0 - 36.0 g/dL   RDW 19.1 (H) 11.5 - 15.5 %   Platelets 286 150 - 400 K/uL  Basic metabolic panel     Status: Abnormal   Collection Time: 10/29/17  3:15 AM  Result Value Ref Range   Sodium 135 135 - 145 mmol/L   Potassium 4.6 3.5 - 5.1 mmol/L   Chloride 106 101 - 111 mmol/L   CO2 22 22 - 32 mmol/L    Glucose, Bld 109 (H) 65 - 99 mg/dL   BUN 18 6 - 20 mg/dL   Creatinine, Ser 0.88 0.44 - 1.00 mg/dL   Calcium 8.2 (L) 8.9 - 10.3 mg/dL   GFR calc non Af Amer >60 >60 mL/min   GFR calc Af Amer >60 >60 mL/min   Anion gap 7 5 - 15  Glucose, capillary     Status: None   Collection Time: 10/29/17  6:45 AM  Result Value Ref Range   Glucose-Capillary 96 65 - 99 mg/dL   Comment 1 Notify RN    Comment 2 Document in Chart      Studies/Results: Ct Abdomen Pelvis W Contrast  Result Date: 10/28/2017 CLINICAL DATA:  Abdominal pain, nausea, and fever.  Crohn's disease. EXAM: CT ABDOMEN AND PELVIS WITH CONTRAST TECHNIQUE: Multidetector CT imaging of the abdomen and pelvis was performed using the standard protocol following bolus administration of intravenous contrast. CONTRAST:  140mL ISOVUE-300 IOPAMIDOL (ISOVUE-300) INJECTION 61% COMPARISON:  11/02/2017 FINDINGS: Lower Chest: Increased size of moderate right pleural effusion. Small left pleural effusion is decreased in size. Left chest tube is now seen, with distal tip seen in the inferior middle mediastinum adjacent to hiatal hernia. Previously  seen inferior mediastinal fluid collection surrounding the distal esophagus and small hiatal hernia is nearly completely resolved since previous study. Persistent inflammatory changes seen in the inferior middle mediastinum adjacent to the hiatal hernia. Hepatobiliary: No hepatic masses identified. Prior cholecystectomy. No evidence of biliary obstruction. Pancreas:  No mass or inflammatory changes. Spleen: No evidence of splenomegaly. Small complex fluid collection seen along the posterosuperior margin the spleen measures 3.6 x 1.4 cm on image 20/3, without significant change compared to previous study. Adrenals/Urinary Tract: No masses identified. No evidence of hydronephrosis. Foley catheter seen within urinary bladder which is decompressed. Stomach/Bowel: Rectal tube seen in place. Postop changes from previous  distal small bowel and right colon resection seen. Mild increase in moderate wall thickening is seen involving the distal ileum extending to the ileocolic anastomosis, with mild adjacent mesenteric inflammatory changes. Mild dilatation of small bowel loops proximal to this site noted within the pelvis. This is consistent with Crohn's disease with stricture and partial distal small bowel obstruction. No evidence of abscess. 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 Vascular/Lymphatic: No pathologically enlarged lymph nodes. No abdominal aortic aneurysm. Reproductive: Persistent rim enhancing fluid collection in pelvic cul-de-sac measuring 4.5 x 2.7 cm, without significant change compared to prior exam. This is suspicious for abscess. Other:  None. Musculoskeletal:  No suspicious bone lesions identified. IMPRESSION: Near complete resolution of inferior mediastinal fluid collection/abscess surrounding the distal esophagus and small hiatal hernia and decreased small left pleural effusion, following chest tube placement. Increased moderate right pleural effusion and right basilar atelectasis. Mild increase in wall thickening and adjacent inflammatory changes involving the distal ileum, with mild dilatation of several small bowel loops proximal to this site. This is consistent with Crohn's disease and stricture formation. Stable small rim enhancing fluid collections along posterosuperior margin of the spleen and pelvic cul-de-sac, suspicious for abscesses. Left-sided transsphincteric perianal fistula. CONTRAST EXTRAVASATION CONSULTATION: Type of contrast:  Isovue 300 Site of extravasation: Upper right forearm Estimated volume of extravasation: 50 ml Area of extravasation scanned with CT? no PATIENT'S SIGNS AND SYMPTOMS Skin blistering/ulceration: no Decrease capillary refill: no Change in skin color: no Decreased motor function or severe tightness: no Decreased pulses distal to site of  extravasation: no Altered sensation: no Increasing pain or signs of increased swelling during observation: no TREATMENT Observation period at site: 20 minutes Limb elevation: yes Ice packs applied: yes Heat pads applied: No Plastic surgery consulted? no DOCUMENTATION AND FOLLOW-UP Site contrast extravasation forms submitted? yes Post extravasation orders completed? yes Was additional follow up assigned to PA's? Yes Patient's questions answered? yes Patient instructed to call [phone] or seek immediate medical care if symptoms progress. Electronically Signed   By: Earle Gell M.D.   On: 10/28/2017 12:56   US Renal  Result Date: 10/28/2017 CLINICAL DATA:  Urinary retention EXAM: LIMITED RENAL ULTRASOUND COMPARISON:  None. FINDINGS: Right Kidney: Length: 11.1 cm. Echogenicity and renal cortical thickness are within normal limits. No mass, perinephric fluid, or hydronephrosis visualized. No sonographically demonstrable calculus or ureterectasis. Left Kidney: Patient refused to allow imaging of left flank. Bladder: Decompressed with Foley catheter. IMPRESSION: Limited study as patient refused to allow imaging of the left flank. Right kidney appears unremarkable. Urinary bladder decompressed with Foley catheter and cannot be assessed. Electronically Signed   By: Lowella Grip III M.D.   On: 10/28/2017 13:32    . acetaminophen  1,000 mg Oral Q6H   Or  . acetaminophen (TYLENOL) oral liquid 160 mg/5 mL  1,000 mg  Oral Q6H  . feeding supplement (PRO-STAT SUGAR FREE 64)  30 mL Oral BID  . fentaNYL   Intravenous Q4H  . lip balm  1 application Topical BID  . pantoprazole  40 mg Intravenous Q12H  . promethazine  6.25 mg Intravenous TID AC     Assessment/Plan: s/p Procedure(s): LEFT VIDEO ASSISTED THORACOSCOPY (VATS) for MEDISTINAL DRAINAGE  Stable but still febrile. Not sure whether this is abdominal or pulmonary source Going to IR today for percutaneous drainage of abdominal fluid  collections  Defer need for thoracentesis to thoracic surgery  Replacement of gastrostomy tube probably not a good idea. Suggestion of Surgical placement of feeding jejunostomy tube will be discussed.  May be somewhat problematic due to 2 abdominal operations recently and adhesions Ideally we could resume oral intake @PROBHOSP @  LOS: 15 days    [name] 10/29/2017  . .prob

## 2017-10-29 NOTE — Consult Note (Signed)
Chief Complaint: Patient was seen in consultation today for perihepatic abscess aspiration/drain placement Chief Complaint  Patient presents with  . Nausea  . Emesis   at the request of Dr Jeanmarie Hubert; Dr Leane Para   Supervising Physician: Markus Daft  Patient Status: [facility] - In-pt  History of Present Illness: Danielle [name] is a 71 y.o. female   Chron's disease; SBO Hiatal hernia repair [id number]/7096 Post op complications- intubated/vent Ultimately improved and DC home Returned 10/26 with fevers - DC home Returned again 11/04/2017- N./V; abd pain Left VATS 11/03/2017- Dr Roxan Hockey Continued fevers; N/V CT 11/22:  IMPRESSION: Near complete resolution of inferior mediastinal fluid collection/abscess surrounding the distal esophagus and small hiatal hernia and decreased small left pleural effusion, following chest tube placement. Increased moderate right pleural effusion and right basilar atelectasis. Mild increase in wall thickening and adjacent inflammatory changes involving the distal ileum, with mild dilatation of several small bowel loops proximal to this site. This is consistent with Crohn's disease and stricture formation. Stable small rim enhancing fluid collections along posterosuperior margin of the spleen and pelvic cul-de-sac, suspicious for abscesses. Left-sided transsphincteric perianal fistula  Request for abscess drain per Dr Donne Hazel Imaging reviewed with Dr Anselm Pancoast Approved perihepatic abscess aspiration/drain placement All other collections are too small; no safe window for access. Dr Dalbert Batman aware and agreeable to move ahead with perihepatic abscess aspiration/drain placement   Past Medical History:  Diagnosis Date  . Crohn disease ([place])   . Hiatal hernia 12/07/2015  . Left buttock abscess 04/21/2017  . Psoriasis   . SBO (small bowel obstruction) ([place]) 07/27/2017    Past Surgical History:  Procedure Laterality Date  . ANAL STENOSIS  ~ 2010  .  APPENDECTOMY  1980's  . BOWEL RESECTION  2009  . CHOLECYSTECTOMY  1990's  . COLONOSCOPY  2013  . COLONOSCOPY WITH PROPOFOL N/A 03/12/2017   Procedure: COLONOSCOPY WITH PROPOFOL;  Surgeon: Rogene Houston, MD;  [facility];  Service: Endoscopy;  Laterality: N/A;  2:05-moved up to 1025 per Lelon Frohlich  . COLOSTOMY  2009   DEMASO  . COLOSTOMY TAKEDOWN  2009   "only had it for about 1 month" (08/22/2013)  . ESOPHAGEAL DILATION N/A 03/12/2017   Procedure: ESOPHAGEAL DILATION;  Surgeon: Rogene Houston, MD;  [facility];  Service: Endoscopy;  Laterality: N/A;  . ESOPHAGOGASTRODUODENOSCOPY  11/11/2011   Procedure: ESOPHAGOGASTRODUODENOSCOPY (EGD);  Surgeon: Missy Sabins, MD;  Location: [facility] ENDOSCOPY;  Service: Endoscopy;  Laterality: N/A;  . ESOPHAGOGASTRODUODENOSCOPY  05/18/2012   Procedure: ESOPHAGOGASTRODUODENOSCOPY (EGD);  Surgeon: Rogene Houston, MD;  [facility];  Service: Endoscopy;  Laterality: N/A;  200  . ESOPHAGOGASTRODUODENOSCOPY (EGD) WITH PROPOFOL N/A 03/12/2017   Procedure: ESOPHAGOGASTRODUODENOSCOPY (EGD) WITH PROPOFOL;  Surgeon: Rogene Houston, MD;  [facility];  Service: Endoscopy;  Laterality: N/A;  . FLEXIBLE SIGMOIDOSCOPY  11/11/2011   Procedure: FLEXIBLE SIGMOIDOSCOPY;  Surgeon: Missy Sabins, MD;  Location: [place];  Service: Endoscopy;  Laterality: N/A;  . goiter  1999  . HIATAL HERNIA REPAIR N/A 09/08/2017   Procedure: DIAGNOSTIC LAPAROSCOPIC PLACEMENT OF gastrostomy TUBE AND GASTROPEXY;  Surgeon: Ralene Ok, MD;  Location: [facility];  Service: General;  Laterality: N/A;  . INCISION AND DRAINAGE PERIRECTAL ABSCESS N/A 04/21/2017   Procedure: IRRIGATION AND DEBRIDEMENT PERIRECTAL ABSCESS;  Surgeon: Coralie Keens, MD;  Location: [place];  Service: General;  Laterality: N/A;  . LAPAROSCOPIC LYSIS OF ADHESIONS N/A 09/08/2017   Procedure: LAPAROSCOPIC  LYSIS OF ADHESIONS;  Surgeon: Ralene Ok, MD;  Location: [facility];  Service: General;   Laterality: N/A;  . LAPAROTOMY N/A 09/10/2017   Procedure: Exploratory laparotomy with takedown of G tube, repair gastrostomy abdominal washout;  Surgeon: Clovis Riley, MD;  Location: [facility];  Service: General;  Laterality: N/A;  . LEFT HEART CATHETERIZATION WITH CORONARY ANGIOGRAM N/A 07/09/2014   Procedure: LEFT HEART CATHETERIZATION WITH CORONARY ANGIOGRAM;  Surgeon: Peter M Martinique, MD;  Location: [facility] CATH LAB;  Service: Cardiovascular;  Laterality: N/A;  . PROCTOSCOPY N/A 01/31/2015   Procedure: RIGID PROCTOSCOPY;  Surgeon: Leighton Ruff, MD;  Location: [place];  Service: General;  Laterality: N/A;  . RECTAL EXAM UNDER ANESTHESIA N/A 01/31/2015   Procedure: RECTAL EXAM UNDER ANESTHESIA WITH PLACEMENT OF A SETON;  Surgeon: Leighton Ruff, MD;  Location: [place];  Service: General;  Laterality: N/A;  . VIDEO ASSISTED THORACOSCOPY (VATS)/DECORTICATION Left 11/01/2017   Procedure: LEFT VIDEO ASSISTED THORACOSCOPY (VATS) for MEDISTINAL DRAINAGE;  Surgeon: Melrose Nakayama, MD;  Location: [place];  Service: Thoracic;  Laterality: Left;    Allergies: Zithromax [azithromycin]; Aspirin; Ibuprofen; Iron; and Sulfa antibiotics  Medications: Prior to Admission medications   Medication Sig Start Date End Date Taking? Authorizing Provider  albuterol (PROVENTIL HFA;VENTOLIN HFA) 108 (90 Base) MCG/ACT inhaler Inhale 2 puffs into the lungs every 6 (six) hours as needed for wheezing. 12/09/15  Yes Rama, Venetia Maxon, MD  apixaban (ELIQUIS) 5 MG TABS tablet Take 10 mg twice a day for 7 days and then start using 5 mg BID. 10/07/17  Yes Barton Dubois, MD  cyanocobalamin (,VITAMIN B-12,) 1000 MCG/ML injection INJECT 1000 MCG IM ONCE MONTHLY 07/09/17  Yes Rehman, Mechele Dawley, MD  dicyclomine (BENTYL) 10 MG/5ML syrup TAKE 10 MLS (20 MG TOTAL) BY MOUTH 3 (THREE) TIMES DAILY AS NEEDED. Patient taking differently: Take 20 mg 3 (three) times daily as needed by mouth (stomach cramps).  07/26/17  Yes Rehman, Mechele Dawley, MD    diphenoxylate-atropine (LOMOTIL) 2.5-0.025 MG tablet TAKE 1 TABLET BY MOUTH FOUR TIMES A DAY AS NEEDED Patient taking differently: Take one tablet twice daily. 06/28/17  Yes Setzer, Terri L, NP  feeding supplement (BOOST / RESOURCE BREEZE) LIQD Take 1 Container by mouth 3 (three) times daily between meals. 10/07/17  Yes Barton Dubois, MD  furosemide (LASIX) 20 MG tablet Take 1 tablet (20 mg total) by mouth daily as needed (Take for weight gain of more than 2 pounds in a day or 5 pounds in 2 days.). 06/16/17  Yes Lavina Hamman, MD  HYDROcodone-acetaminophen (NORCO) 7.5-325 MG tablet Take 1 tablet by mouth every 6 (six) hours as needed for moderate pain or severe pain. 09/26/17  Yes Michael Boston, MD  magnesium oxide (MAG-OX) 400 (241.3 Mg) MG tablet Take 1 tablet (400 mg total) by mouth daily. 10/07/17  Yes Barton Dubois, MD  nystatin (NYSTATIN) powder Apply 1 g topically 2 (two) times daily as needed (for itching/irritation).   Yes [provider]  pantoprazole (PROTONIX) 40 MG tablet Take 1 tablet (40 mg total) by mouth daily. 06/16/17  Yes Nita Sells, MD  prochlorperazine (COMPAZINE) 10 MG tablet Take 1 tablet (10 mg total) by mouth every 6 (six) hours as needed for refractory nausea / vomiting. 10/07/17  Yes Barton Dubois, MD  sodium bicarbonate 650 MG tablet Take 650 mg by mouth 2 (two) times daily.   Yes [provider]  spironolactone (ALDACTONE) 50 MG tablet Take 50 mg by mouth daily.  06/23/17  Yes [provider]  acetaminophen (TYLENOL) 325 MG tablet Take 2 tablets (650 mg total) by mouth every 6 (six) hours as needed for mild pain or moderate pain. Patient not taking: Reported on 10/16/2017 10/07/17   Barton Dubois, MD  promethazine (PHENERGAN) 25 MG tablet TAKE 1 TABLET (25 MG TOTAL) BY MOUTH 2 (TWO) TIMES DAILY AS NEEDED FOR NAUSEA OR VOMITING. Patient not taking: Reported on 10/27/2017 09/29/17   Rogene Houston, MD     Family History  Problem  Relation Age of Onset  . Diabetes Mother   . Heart disease Father   . Healthy Sister   . Hypertension Brother   . Colon cancer Brother        died with colon cancer  . Crohn's disease Paternal Aunt     Social History   Socioeconomic History  . Marital status: Single    Spouse name: None  . Number of children: None  . Years of education: None  . Highest education level: None  Social Needs  . Financial resource strain: None  . Food insecurity - worry: None  . Food insecurity - inability: None  . Transportation needs - medical: None  . Transportation needs - non-medical: None  Occupational History  . None  Tobacco Use  . Smoking status: Former Smoker    Packs/day: 0.12    Years: 4.00    Pack years: 0.48    Types: Cigarettes    Last attempt to quit: 10/05/1996    Years since quitting: 21.0  . Smokeless tobacco: Never Used  . Tobacco comment: 08/22/2013 1 pack every two weeks when she did smoked  Substance and Sexual Activity  . Alcohol use: No    Alcohol/week: 0.0 oz  . Drug use: No  . Sexual activity: Not Currently    Birth control/protection: Post-menopausal, Implant  Other Topics Concern  . None  Social History Narrative   Lives in [place].  Moved from [place] in 2008.  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  LIves with her sister.   No home health services.      Review of Systems: A 12 point ROS discussed and pertinent positives are indicated in the HPI above.  All other systems are negative.  Review of Systems  Constitutional: Positive for activity change, appetite change, fatigue and fever.  Respiratory: Negative for shortness of breath.   Gastrointestinal: Positive for abdominal pain, nausea and vomiting.  Neurological: Positive for weakness.  Psychiatric/Behavioral: Negative for behavioral problems and confusion.    Vital Signs: BP (!) 146/80   Pulse (!) 102   Temp (!) 102.2 F (39 C)   Resp (!) 33   Ht 5' 4.5" (1.638 m)   Wt 123 lb 0.3 oz (55.8 kg)   SpO2  100%   BMI 20.79 kg/m   Physical Exam  Constitutional: She is oriented to person, place, and time.  Cardiovascular: Normal rate and regular rhythm.  Pulmonary/Chest: Effort normal. She has wheezes.  Abdominal: Soft. Bowel sounds are normal. There is tenderness.  Musculoskeletal: Normal range of motion.  Neurological: She is alert and oriented to person, place, and time.  Skin: Skin is warm and dry.  Psychiatric: She has a normal mood and affect. Her behavior is normal. Judgment and thought content normal.  Nursing note and vitals reviewed.   Imaging: Dg Chest 1 View  Result Date: 10/18/2017 CLINICAL DATA:  PICC line placement EXAM: CHEST 1 VIEW COMPARISON:  10/18/2017 FINDINGS: Limited by positioning. Left  upper extremity catheter tip is looped back upon itself over the brachiocephalic region. Small pleural effusions. Stable cardiomegaly with vascular congestion and bibasilar airspace disease. Hiatal hernia. IMPRESSION: 1. Left upper extremity catheter tip is folded back upon itself over the brachiocephalic region 2. Small bilateral pleural effusions with bibasilar atelectasis or pneumonia. 3. Cardiomegaly with vascular congestion Electronically Signed   By: Donavan Foil M.D.   On: 10/18/2017 19:56   Dg Chest 2 View  Result Date: 10/24/2017 CLINICAL DATA:  Hemoptysis EXAM: CHEST  2 VIEW COMPARISON:  Chest x-rays dated 10/24/2016 and 10/22/2017. FINDINGS: Heart size and mediastinal contours are stable. Lines and tubes are stable in the short-term interval. Bibasilar opacities are stable. Probable small left pleural effusion. Hiatal hernia better demonstrated on earlier exams. IMPRESSION: 1. No interval change. Patchy bibasilar opacities are stable, atelectasis versus aspiration. Probable small left pleural effusion. 2. Lines and tubes are stable in position. Electronically Signed   By: Franki Cabot M.D.   On: 10/24/2017 16:36   Dg Chest 2 View  Result Date: 11/02/2017 CLINICAL DATA:   Dyspnea and possible sepsis. EXAM: CHEST  2 VIEW COMPARISON:  10/05/2017 CT chest.  09/2028 CXR. FINDINGS: Borderline cardiomegaly with minimal aortic atherosclerosis. There has been interval decrease in left-sided pleural effusion now small to moderate. Probable compressive atelectasis at the left lung base accounting for retrocardiac homogeneous opacity that still persists. Mild central vascular congestion is again re- demonstrated. No acute osseous abnormality. Chronic right-sided rib fractures. The patient's chin obscures the lung apices. Left-sided PICC line has been removed in the interim. IMPRESSION: 1. Borderline cardiomegaly with interval decrease in left pleural effusion. Persistent presumed compressive atelectasis at the left lung base and mild central vascular congestion. 2. Mild aortic atherosclerosis. Electronically Signed   By: Ashley Royalty M.D.   On: 11/03/2017 14:58   Ct Chest Wo Contrast  Result Date: 10/05/2017 CLINICAL DATA:  71 y/o F; shortness of breath with history of hiatal hernia, Crohn's disease, psoriasis, an small bowel obstruction. EXAM: CT CHEST WITHOUT CONTRAST TECHNIQUE: Multidetector CT imaging of the chest was performed following the standard protocol without IV contrast. COMPARISON:  09/09/2017 CT chest.  04/29/2017 chest radiograph. FINDINGS: Cardiovascular: No significant vascular findings. Normal heart size. No pericardial effusion. Mild coronary artery calcification. Mediastinum/Nodes: Large hiatal hernia. Patulous fluid-filled esophagus. Right lobe of thyroid nodule measuring up to 18 mm (series 2, image 20). Lungs/Pleura: Small right and moderate left pleural effusions. Right lower lobe platelike atelectasis. Partial consolidation of the left lower lobe. Mild interlobular septal thickening compatible with interstitial edema. Upper Abdomen: Cholecystectomy. Musculoskeletal: Multiple chronic bilateral rib fractures. Stable mild anterior L1 compression deformity. No acute  osseous abnormality identified. IMPRESSION: 1. Small right and moderate left pleural effusions. Mild interstitial pulmonary edema. 2. Partial consolidation of left lower lobe may represent atelectasis or pneumonia. 3. Large hiatal hernia. Patulous fluid-filled esophagus indicating reflux. 4. Right lobe of thyroid nodule measuring 18 mm, thyroid ultrasound recommended on a nonemergent basis. 5. Mild coronary artery calcification. Electronically Signed   By: Kristine Garbe M.D.   On: 10/05/2017 19:59   Ct Chest W Contrast  Result Date: 10/25/2017 CLINICAL DATA:  71 y/o F; history of paraesophageal hernia repair 09/08/2017 and mediastinal abscess post vats and drainage 10/28/2017. EXAM: CT CHEST WITH CONTRAST TECHNIQUE: Multidetector CT imaging of the chest was performed during intravenous contrast administration. CONTRAST:  72 cc Isovue-300 COMPARISON:  10/05/2017 CT of the chest. FINDINGS: Cardiovascular: Normal heart size. No pericardial effusion. Left subclavian  line extends to the upper SVC. The left PICC line tip extends into the azygos vein. Normal caliber thoracic aorta. Normal caliber main pulmonary artery. Mediastinum/Nodes: Patulous and fluid-filled esophagus as well as large hiatal hernia. The lower esophagus is thick-walled near the gastroesophageal junction likely representing reactive inflammatory changes. Within the posterior inferior mediastinum in the region of the surgical drain to the left and posterior of the herniated gastric cardia and anterior to the descending thoracic aorta there is an ill-defined multiloculated extraluminal fluid collection spanning approximately in conglomerate 3.8 x 2.7 x 3.9 cm likely representing the drained abscess(AP x ML x CC series 3, image 72 and series 5, image 73). Additionally, to the right and posterior of the herniated gastric cardia there is a discrete rim enhancing fluid collection measuring 14 mm likely representing a separate abscess  loculation (series 3, image 82). Multiple stable thyroid nodules are present measuring up to 18 mm in the right lobe of the thyroid (series 5, image 33). Lungs/Pleura: Small left and moderate right pleural effusions are present. There is a left-sided chest tube and second left-sided drain traversing the left pleural space into the lower posterior mediastinum. No pneumothorax. Consolidations are present within the dependent lower lobes bilaterally. Upper Abdomen: Multiloculated rim enhancing fluid collections are present under the diaphragm surrounding the spleen and extending into the left pericolic gutter the representing additional abscesses. Musculoskeletal: Multiple chronic bilateral rib fractures are present. No acute osseous abnormality is identified. IMPRESSION: 1. Ill-defined multiloculated rim enhancing fluid collection and extensive edema in posterior mediastinum inferior to mediastinal drain and anterior to aorta, probably representing the residual of drained abscess. Discrete 14 mm abscess present in the right posterior mediastinum. 2. Multiloculated rim enhancing fluid collection surrounding the spleen and extending into left pericolic gutter, likely abscess. 3. Small bilateral pleural effusions with left pleural drain. 4. Stable dependent lower lobe consolidations may represent atelectasis or pneumonia. Electronically Signed   By: Kristine Garbe M.D.   On: 10/25/2017 22:41   Ct Abdomen Pelvis W Contrast  Result Date: 10/28/2017 CLINICAL DATA:  Abdominal pain, nausea, and fever.  Crohn's disease. EXAM: CT ABDOMEN AND PELVIS WITH CONTRAST TECHNIQUE: Multidetector CT imaging of the abdomen and pelvis was performed using the standard protocol following bolus administration of intravenous contrast. CONTRAST:  137mL ISOVUE-300 IOPAMIDOL (ISOVUE-300) INJECTION 61% COMPARISON:  11/02/2017 FINDINGS: Lower Chest: Increased size of moderate right pleural effusion. Small left pleural effusion is  decreased in size. Left chest tube is now seen, with distal tip seen in the inferior middle mediastinum adjacent to hiatal hernia. Previously seen inferior mediastinal fluid collection surrounding the distal esophagus and small hiatal hernia is nearly completely resolved since previous study. Persistent inflammatory changes seen in the inferior middle mediastinum adjacent to the hiatal hernia. Hepatobiliary: No hepatic masses identified. Prior cholecystectomy. No evidence of biliary obstruction. Pancreas:  No mass or inflammatory changes. Spleen: No evidence of splenomegaly. Small complex fluid collection seen along the posterosuperior margin the spleen measures 3.6 x 1.4 cm on image 20/3, without significant change compared to previous study. Adrenals/Urinary Tract: No masses identified. No evidence of hydronephrosis. Foley catheter seen within urinary bladder which is decompressed. Stomach/Bowel: Rectal tube seen in place. Postop changes from previous distal small bowel and right colon resection seen. Mild increase in moderate wall thickening is seen involving the distal ileum extending to the ileocolic anastomosis, with mild adjacent mesenteric inflammatory changes. Mild dilatation of small bowel loops proximal to this site noted within the pelvis. This is  consistent with Crohn's disease with stricture and partial distal small bowel obstruction. No evidence of abscess. 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 Vascular/Lymphatic: No pathologically enlarged lymph nodes. No abdominal aortic aneurysm. Reproductive: Persistent rim enhancing fluid collection in pelvic cul-de-sac measuring 4.5 x 2.7 cm, without significant change compared to prior exam. This is suspicious for abscess. Other:  None. Musculoskeletal:  No suspicious bone lesions identified. IMPRESSION: Near complete resolution of inferior mediastinal fluid collection/abscess surrounding the distal esophagus and small hiatal  hernia and decreased small left pleural effusion, following chest tube placement. Increased moderate right pleural effusion and right basilar atelectasis. Mild increase in wall thickening and adjacent inflammatory changes involving the distal ileum, with mild dilatation of several small bowel loops proximal to this site. This is consistent with Crohn's disease and stricture formation. Stable small rim enhancing fluid collections along posterosuperior margin of the spleen and pelvic cul-de-sac, suspicious for abscesses. Left-sided transsphincteric perianal fistula. CONTRAST EXTRAVASATION CONSULTATION: Type of contrast:  Isovue 300 Site of extravasation: Upper right forearm Estimated volume of extravasation: 50 ml Area of extravasation scanned with CT? no PATIENT'S SIGNS AND SYMPTOMS Skin blistering/ulceration: no Decrease capillary refill: no Change in skin color: no Decreased motor function or severe tightness: no Decreased pulses distal to site of extravasation: no Altered sensation: no Increasing pain or signs of increased swelling during observation: no TREATMENT Observation period at site: 20 minutes Limb elevation: yes Ice packs applied: yes Heat pads applied: No Plastic surgery consulted? no DOCUMENTATION AND FOLLOW-UP Site contrast extravasation forms submitted? yes Post extravasation orders completed? yes Was additional follow up assigned to PA's? Yes Patient's questions answered? yes Patient instructed to call [phone] or seek immediate medical care if symptoms progress. Electronically Signed   By: Earle Gell M.D.   On: 10/28/2017 12:56   Ct Abdomen Pelvis W Contrast  Result Date: 11/03/2017 CLINICAL DATA:  Nausea, vomiting.  Decreased urine output. EXAM: CT ABDOMEN AND PELVIS WITH CONTRAST TECHNIQUE: Multidetector CT imaging of the abdomen and pelvis was performed using the standard protocol following bolus administration of intravenous contrast. CONTRAST:  118mL ISOVUE-300 IOPAMIDOL (ISOVUE-300)  INJECTION 61% COMPARISON:  CT abdomen dated 10/01/2017. FINDINGS: Lower chest: Bibasilar pleural effusions, left greater than right, incompletely imaged, with associated atelectasis. Interval reaccumulation of the fluid collection in the lower mediastinum, surrounding the herniated stomach, measuring approximately 8 cm transverse dimension by 3 cm greatest thickness, craniocaudal dimension incompletely imaged. This collection is now similar to the size seen on CT abdomen of 09/16/2017, when a drainage catheter was in place. Hepatobiliary: No focal liver abnormality is seen. Status post cholecystectomy. No biliary dilatation. Pancreas: Unremarkable. No pancreatic ductal dilatation or surrounding inflammatory changes. Spleen: Small complex collection along the posterior margin of the spleen, possibly abscess, measuring 3.3 x 1.3 cm. Spleen otherwise unremarkable. Adrenals/Urinary Tract: Adrenal glands appear normal. Kidneys are unremarkable without mass, stone or hydronephrosis. No ureteral or bladder calculi. Bladder is unremarkable. Stomach/Bowel: No dilated large or small bowel loops. No convincing evidence of bowel wall thickening or bowel wall inflammation within the abdomen or pelvis. There is at least some thickening/inflammation of the walls of the herniated stomach and lower esophagus, again incompletely imaged, likely reactive to the adjacent fluid collection. Vascular/Lymphatic: No significant vascular findings are present. No enlarged abdominal or pelvic lymph nodes. Reproductive: No adnexal mass or free fluid. Other: No free intraperitoneal air. Musculoskeletal: No acute or suspicious osseous finding. IMPRESSION: 1. Reaccumulation of the previously demonstrated fluid collection in the lower mediastinum,  presumably abscess, likely multiloculated, surrounding the herniated portion of the stomach and lower esophagus, measuring 8 x 3 cm (transverse by AP dimensions), craniocaudal extent incompletely imaged.  The size of the fluid collection is now similar to the size seen on CT abdomen of 09/16/2017, at which point there were drainage catheters in place. 2. Thickening of the walls of the herniated portion of the stomach and the overlying esophagus, incompletely imaged, likely reactive to the adjacent fluid collection/abscess. 3. Bibasilar pleural effusions, left greater than right, incompletely imaged, with associated atelectasis. 4. Small complex collection along the posterior margin of the spleen, possibly additional small abscess, measuring 3.3 x 1.3 cm. 5. No evidence of bowel obstruction within the abdomen or pelvis. Recommend chest CT to determine the entire craniocaudal extent of the mediastinal fluid collection. These results were called by telephone at the time of interpretation on 10/13/2017 at 5:45 pm to Dr. Vanita Panda, who verbally acknowledged these results. Electronically Signed   By: Franki Cabot M.D.   On: 10/22/2017 17:51   Ct Abdomen Pelvis W Contrast  Result Date: 10/01/2017 CLINICAL DATA:  Fever. 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 EXAM: CT ABDOMEN AND PELVIS WITH CONTRAST TECHNIQUE: Multidetector CT imaging of the abdomen and pelvis was performed using the standard protocol following bolus administration of intravenous contrast. CONTRAST:  134mL ISOVUE-300 IOPAMIDOL (ISOVUE-300) INJECTION 61% COMPARISON:  09/24/2017 as well as multiple prior CT studies. FINDINGS: Lower chest: In the lower chest, there remains a moderate hiatal hernia with some residual small amount of complex fluid around the hernia. This appears improved compared to the prior study but is incompletely visualized. A mediastinal drain has been removed. There remains a moderate amount of pleural fluid at the left lung base.  Given fever, there may be benefit to examining the entire chest for evidence of infection in the chest. Hepatobiliary: No focal liver abnormality is seen. Status post cholecystectomy. No biliary dilatation. Pancreas: Unremarkable. No pancreatic ductal dilatation or surrounding inflammatory changes. Spleen: Normal in size without focal abnormality. Adrenals/Urinary Tract: Adrenal glands are unremarkable. Kidneys are normal, without renal calculi, focal lesion, or hydronephrosis. Bladder is unremarkable. Stomach/Bowel: A few pelvic small bowel loops are moderately dilated and fluid-filled, likely consistent with ileus. Other small bowel loops and the colon are decompressed and there is no evidence of a significant bowel obstruction. No evidence of free intraperitoneal air or new focal abscess. Vascular/Lymphatic: No significant vascular findings are present. No enlarged abdominal or pelvic lymph nodes. Reproductive: Uterus and bilateral adnexa are unremarkable. Other: Right transgluteal percutaneous drainage catheter remains in place. The cul de sac abscess has essentially resolved with small residual amount of fluid remaining adjacent to the drainage catheter. Musculoskeletal: No acute or significant osseous findings. IMPRESSION: 1. Less prominent complex fluid surrounding residual hiatal hernia in the lower chest. This is incompletely visualized, however and there remains a moderate left pleural effusion. Given no significant new findings in the abdomen or pelvis, there may be benefit in obtaining a CT of the chest with contrast to examine the entire chest. 2. Some degree of ileus involving distal small bowel without evidence of significant bowel obstruction. No evidence of bowel perforation or new focal abscess. 3. Nearly completely decompressed and resolved pelvic abscess at the level of the cul de sac. Only a small amount of fluid remains adjacent to the indwelling pelvic  drainage catheter. Electronically Signed    By: Aletta Edouard M.D.   On: 10/01/2017 13:00   US Renal  Result Date: 10/28/2017 CLINICAL DATA:  Urinary retention EXAM: LIMITED RENAL ULTRASOUND COMPARISON:  None. FINDINGS: Right Kidney: Length: 11.1 cm. Echogenicity and renal cortical thickness are within normal limits. No mass, perinephric fluid, or hydronephrosis visualized. No sonographically demonstrable calculus or ureterectasis. Left Kidney: Patient refused to allow imaging of left flank. Bladder: Decompressed with Foley catheter. IMPRESSION: Limited study as patient refused to allow imaging of the left flank. Right kidney appears unremarkable. Urinary bladder decompressed with Foley catheter and cannot be assessed. Electronically Signed   By: Lowella Grip III M.D.   On: 10/28/2017 13:32   Dg Chest Port 1 View  Result Date: 10/28/2017 CLINICAL DATA:  Mediastinal abscess EXAM: PORTABLE CHEST 1 VIEW COMPARISON:  10/27/2017 FINDINGS: Mediastinal drain on the left unchanged from the prior study. Bibasilar atelectasis unchanged. Small bilateral effusions unchanged. No pneumothorax Central line tips unchanged. One line in the SVC other in the azygos vein. IMPRESSION: Mediastinal drain unchanged in position. Bibasilar atelectasis/ infiltrate and small effusions unchanged. Electronically Signed   By: Franchot Gallo M.D.   On: 10/28/2017 08:54   Dg Chest Port 1 View  Result Date: 10/27/2017 CLINICAL DATA:  Fever EXAM: PORTABLE CHEST 1 VIEW COMPARISON:  10/24/2017 FINDINGS: Left dialysis catheter and PICC line remain in place, unchanged. PICC line courses superiorly, unchanged. Interval removal of left chest tube. No pneumothorax. Mild cardiomegaly. Bilateral perihilar and lower lobe opacities, similar to prior study. IMPRESSION: Interval removal of left chest tube without pneumothorax. Otherwise no significant change. Electronically Signed   By: Rolm Baptise M.D.   On: 10/27/2017 09:45   Dg Chest Port 1 View  Result Date:  10/24/2017 CLINICAL DATA:  Acute onset of fever, vomiting and cough. Concern for aspiration. Patient has left-sided chest tubes. EXAM: PORTABLE CHEST 1 VIEW COMPARISON:  Chest radiograph performed 10/22/2017 FINDINGS: Bilateral mid and lower lung zone airspace opacities appear mildly worsened and could reflect aspiration pneumonia, given clinical concern. Small bilateral pleural effusions are suspected. No pneumothorax is seen, though the lung apices are obscured by the patient's head. Left apical and left basilar chest tubes are unchanged in appearance. The cardiomediastinal silhouette is mildly enlarged. No acute osseous abnormalities are seen. A large hiatal hernia is noted. IMPRESSION: 1. Bilateral mid and lower lung zone airspace opacities appear mildly worsened and could reflect aspiration pneumonia, given clinical concern. 2. Suspect small bilateral pleural effusions. 3. Mild cardiomegaly. 4. Large hiatal hernia. Electronically Signed   By: Garald Balding M.D.   On: 10/24/2017 00:36   Dg Chest Port 1 View  Result Date: 10/22/2017 CLINICAL DATA:  Mediastinum abscess.  Shortness of breath . EXAM: PORTABLE CHEST 1 VIEW COMPARISON:  10/26/2017. FINDINGS: Left subclavian line, left PICC line, 2 left chest tubes in stable position. Stable cardiomegaly. Pulmonary vascular prominence and bilateral interstitial prominence and bilateral pleural effusions noted. Findings consistent with CHF. Surgical clips are noted over the chest. Hiatal hernia. IMPRESSION: 1. Lines and tubes including 2 left chest tubes in stable position. No pneumothorax. 2. Cardiomegaly with basilar pulmonary infiltrates and bilateral pleural effusions consistent CHF. Bibasilar pneumonia cannot be excluded. 3.  Bibasilar atelectasis. Electronically Signed   By: Marcello Moores  Register   On: 10/22/2017 09:27   Dg Chest Port 1 View  Result Date: 10/22/2017 CLINICAL DATA:  71 year old female postop day zero VATS for possible mediastinal abscess.  Status post ultrasound-guided left thoracentesis  3 days ago. EXAM: PORTABLE CHEST 1 VIEW COMPARISON:  0421 hours today and earlier. FINDINGS: Portable AP semi upright view at 1046 hours. There is a chest tube coursing to the central mediastinal contour. A second chest tube courses in the left lateral chest up to the left lung apex. No pneumothorax. Suspected small volume pneumomediastinum which is likely postoperative. The left upper extremity PICC line now appears to course to the right atrium level. There is a new left subclavian approach catheter or tube of unclear significance. Larger lung volumes. Overall stable mediastinal contour. Patchy perihilar and lung base opacity has not significantly changed. Possible small right pleural effusion. Negative visible bowel gas pattern.  Stable cholecystectomy clips. IMPRESSION: 1. Mediastinal in left chest tube in place with no pneumothorax. Small postoperative pneumomediastinum suspected. Stable mediastinal contour. 2. Patchy perihilar and lung base opacity not significantly changed. 3. Left upper extremity PICC line catheter tip now at the right atrium level. Electronically Signed   By: Genevie Ann M.D.   On: 10/12/2017 11:08   Dg Chest Port 1 View  Result Date: 10/30/2017 CLINICAL DATA:  Followup pleural effusion. EXAM: PORTABLE CHEST 1 VIEW COMPARISON:  10/18/2017 and multiple previous. FINDINGS: Patient would not agree to raise her chin. Left arm PICC tip is probably unchanged in the SVC at the azygos level. Small amount of pleural fluid persists on the left and possibly on the right. Atelectasis in both lower lungs persists, left worse than right. Volume loss may be slightly worsened in the left lower lobe. Lap for technical factors, no other change appreciated since the study of 3 days ago. IMPRESSION: Probable small effusions. Lower lobe atelectasis left worse than right, probably with some worsening on the left since the study of 3 days ago. Electronically  Signed   By: Nelson Chimes M.D.   On: 10/26/2017 07:00   Dg Chest Port 1 View  Result Date: 10/18/2017 CLINICAL DATA:  PICC repositioning. EXAM: PORTABLE CHEST 1 VIEW COMPARISON:  Chest radiograph performed earlier today at 5:42 p.m. FINDINGS: The lungs are mildly hypoexpanded. Small bilateral pleural effusions are suspected. Vascular congestion is noted. Bilateral central airspace opacities may reflect pulmonary edema or possibly pneumonia. No pneumothorax is seen. The cardiomediastinal silhouette is mildly enlarged. No acute osseous abnormalities are identified. The patient's left PICC is noted ending overlying the proximal SVC. Previously noted coiling has resolved. IMPRESSION: 1. Left PICC noted ending overlying the proximal SVC. Previously noted coiling has resolved. 2. Lungs mildly hypoexpanded. Vascular congestion and mild cardiomegaly. Bilateral central airspace opacities may reflect pulmonary edema or possibly pneumonia. Electronically Signed   By: Garald Balding M.D.   On: 10/18/2017 22:22   Dg Chest Port 1 View  Result Date: 10/18/2017 CLINICAL DATA:  Status post left thoracentesis. EXAM: PORTABLE CHEST 1 VIEW COMPARISON:  10/13/2017 FINDINGS: There is decreased pleural fluid on the left following thoracentesis. Residual left lung base opacity is likely atelectasis. There is no pneumothorax. There is opacity at the right lung base mostly obscuring hemidiaphragm consistent with a small right pleural effusion, new/increased when compared to the prior exam. A left PICC has been placed since the prior exam. Tip extends into the brachiocephalic vein but curls back on itself. It is unclear based on this single rotated AP view with this remains in the superior vena cava or extends back into the brachiocephalic vein. IMPRESSION: 1. Decreased left pleural fluid following thoracentesis. No pneumothorax or evidence of a procedure complication. 2. Left PICC positioned as detailed above,  tip curling back on  itself. 3. Small right pleural effusion. Electronically Signed   By: Lajean Manes M.D.   On: 10/18/2017 16:36   Dg Chest Port 1 View  Result Date: 10/04/2017 CLINICAL DATA:  Bedside PICC placement. EXAM: PORTABLE CHEST 1 VIEW COMPARISON:  10/01/2017, 09/14/2017 and earlier. FINDINGS: New left arm PICC courses up into the left internal jugular vein in the tip is not included. The right arm PICC has been removed in the interval. Stable moderate cardiac enlargement. Hiatal hernia as noted previously. Interval decrease in size of the left pleural effusion which is still moderate to large. Stable dense consolidation in the left lower lobe. Improved aeration in the right lung base with mild atelectasis persisting. Mild pulmonary venous hypertension without evidence of pulmonary edema currently. No new pulmonary parenchymal abnormalities. IMPRESSION: 1. Left arm PICC tip courses up into the left internal jugular vein. The catheter should be withdrawn and redirected. 2. Decrease in size of the still moderate to large left pleural effusion since the examination 3 days ago. 3. Stable dense left lower lobe pneumonia and/or atelectasis. 4. Improved aeration in the right lung base with mild atelectasis persisting. 5. No new abnormalities in the lungs. These results will be called to the ordering clinician or representative by the Radiologist Assistant, and communication documented in the PACS or zVision Dashboard. Electronically Signed   By: Evangeline Dakin M.D.   On: 10/04/2017 10:16   Dg Chest Port 1 View  Result Date: 10/01/2017 CLINICAL DATA:  Fever today. Recent abdominal surgery. Nausea and vomiting. EXAM: PORTABLE CHEST 1 VIEW COMPARISON:  09/14/2017 and 09/11/2017 radiographs. FINDINGS: 1223 hours. Two views obtained. The nasogastric tube has been removed. The right arm PICC appears unchanged with the tip at the mid right atrial level. There is stable cardiomegaly. Patient has a known hiatal hernia. Left  pleural effusion and left basilar opacity have increased. Otherwise, there is improved aeration of the lungs without definite residual edema. There is no pneumothorax. The bones appear unchanged. IMPRESSION: Interval resolution of pulmonary edema. Worsening left pleural effusion and left basilar opacity, likely atelectasis. Electronically Signed   By: Richardean Sale M.D.   On: 10/01/2017 12:46   Dg Abd Portable 1v  Result Date: 10/24/2017 CLINICAL DATA:  Acute onset of fever, vomiting and cough. EXAM: PORTABLE ABDOMEN - 1 VIEW COMPARISON:  CT of the abdomen and pelvis performed 10/31/2017 FINDINGS: The visualized bowel gas pattern is unremarkable. Scattered air and stool filled loops of colon are seen; no abnormal dilatation of small bowel loops is seen to suggest small bowel obstruction. No free intra-abdominal air is identified, though evaluation for free air is limited on a single supine view. Postoperative change is noted about the right side of the abdomen and pelvis. The visualized osseous structures are within normal limits; the sacroiliac joints are unremarkable in appearance. IMPRESSION: Unremarkable bowel gas pattern; no free intra-abdominal air seen. Small amount of stool noted in the colon. Electronically Signed   By: Garald Balding M.D.   On: 10/24/2017 00:37   Dg Esophagus W/water Sol Cm  Result Date: 10/18/2017 CLINICAL DATA:  Nausea and vomiting.  Recent gastric surgery. EXAM: ESOPHOGRAM/BARIUM SWALLOW TECHNIQUE: Single contrast examination was performed using water soluble contrast. The study was performed in the LPO semi-upright position. FLUOROSCOPY TIME:  Fluoroscopy Time:  1 minutes 18 seconds Radiation Exposure Index (if provided by the fluoroscopic device): 15.3 mGy COMPARISON:  CT scans dated 10/18/2017 and 10/05/2017 FINDINGS: The oropharyngeal swallowing mechanisms appear  normal. There is a 2.5 cm area of circumferential narrowing of the distal esophagus just proximal to the  gastric fundus. This is in the region of the mediastinal fluid collections seen on the prior CT scan. Water-soluble contrast flows freely through this area. Ms. Woolverton tolerated the procedure well. IMPRESSION: 2.5 cm circumferential narrowing of the distal esophagus just above the gastric fundus. I suspect this is is due to a compression of the esophagus by the mediastinal fluid demonstrated on the prior CT scan. No obstruction. The patient was quite reluctant to drink the contrast. Electronically Signed   By: Lorriane Shire M.D.   On: 10/18/2017 09:31   US Thoracentesis Asp Pleural Space W/img Guide  Result Date: 10/18/2017 INDICATION: Patient with history of mediastinal abscess, Crohn's disease, left pleural effusion. Request made for diagnostic and therapeutic left thoracentesis. EXAM: ULTRASOUND GUIDED DIAGNOSTIC AND THERAPEUTIC LEFT THORACENTESIS MEDICATIONS: None. COMPLICATIONS: None immediate. PROCEDURE: An ultrasound guided thoracentesis was thoroughly discussed with the patient and questions answered. The benefits, risks, alternatives and complications were also discussed. The patient understands and wishes to proceed with the procedure. Written consent was obtained. Ultrasound was performed to localize and mark an adequate pocket of fluid in the left chest. The area was then prepped and draped in the normal sterile fashion. 1% Lidocaine was used for local anesthesia. Under ultrasound guidance a Safe-T-Centesis catheter was introduced. Thoracentesis was performed. The catheter was removed and a dressing applied. FINDINGS: A total of approximately 780 cc of slightly hazy, blood-tinged fluid was removed. Samples were sent to the laboratory as requested by the clinical team. IMPRESSION: Successful ultrasound guided diagnostic and therapeutic left thoracentesis yielding 780 cc of pleural fluid. Read by: Rowe Robert, PA-C Electronically Signed   By: Markus Daft M.D.   On: 10/18/2017 16:35     Labs:  CBC: Recent Labs    10/26/17 0323 10/27/17 0500 10/28/17 0410 10/29/17 0315  WBC 23.9* 19.5* 18.3* 20.6*  HGB 9.2* 8.9* 8.6* 8.9*  HCT 28.4* 28.1* 27.4* 28.0*  PLT 253 266 280 286    COAGS: Recent Labs    05/06/17 2133 05/07/17 0244 09/17/17 1021 10/12/2017 1405 10/15/17 0400  INR 1.18  --  1.24 1.69 1.52  APTT  --  21*  --   --   --     BMP: Recent Labs    10/26/17 0323 10/27/17 0500 10/28/17 0500 10/29/17 0315  NA 135 134* 135 135  K 3.8 4.5 4.5 4.6  CL 106 106 107 106  CO2 21* 23 22 22   GLUCOSE 114* 116* 110* 109*  BUN 16 18 18 18   CALCIUM 7.9* 7.9* 7.8* 8.2*  CREATININE 0.86 0.85 0.87 0.88  GFRNONAA >60 >60 >60 >60  GFRAA >60 >60 >60 >60    LIVER FUNCTION TESTS: Recent Labs    11/01/2017 0321 10/23/17 0513 10/25/17 0005 10/28/17 0500  BILITOT 0.8 0.4 0.3 0.3  AST 60* 28 32 21  ALT 37 27 28 16   ALKPHOS 112 111 145* 120  PROT 6.0* 6.9 6.0* 6.0*  ALBUMIN 1.5* 1.6* 1.5* 1.3*    TUMOR MARKERS: No results for input(s): AFPTM, CEA, CA199, CHROMGRNA in the last 8760 hours.  Assessment and Plan:  Abdominal abscesses Perihepatic abscess amenable to aspiration/drain Now scheduled for same  Risks and benefits discussed with the patient including bleeding, infection, damage to adjacent structures, bowel perforation/fistula connection, and sepsis. All of the patient's questions were answered, patient is agreeable to proceed. Consent signed and in chart.  Thank you for this interesting consult.  I greatly enjoyed meeting NORRIS BODLEY and look forward to participating in their care.  A copy of this report was sent to the requesting provider on this date.  Electronically Signed: Lavonia Drafts, PA-C 10/29/2017, 9:00 AM   I spent a total of 40 Minutes    in face to face in clinical consultation, greater than 50% of which was counseling/coordinating care for perihepatic abscess aspiration/drain

## 2017-10-30 LAB — BASIC METABOLIC PANEL
ANION GAP: 7 (ref 5–15)
Anion gap: 8 (ref 5–15)
BUN: 21 mg/dL — ABNORMAL HIGH (ref 6–20)
BUN: 18 mg/dL (ref 6–20)
CHLORIDE: 107 mmol/L (ref 101–111)
CO2: 19 mmol/L — ABNORMAL LOW (ref 22–32)
CO2: 17 mmol/L — AB (ref 22–32)
CREATININE: 0.95 mg/dL (ref 0.44–1.00)
Calcium: 8.4 mg/dL — ABNORMAL LOW (ref 8.9–10.3)
Calcium: 8.5 mg/dL — ABNORMAL LOW (ref 8.9–10.3)
Chloride: 105 mmol/L (ref 101–111)
Creatinine, Ser: 0.86 mg/dL (ref 0.44–1.00)
GFR calc Af Amer: >60 mL/min (ref >60)
GFR calc non Af Amer: >60 mL/min (ref >60)
GFR calc non Af Amer: 60 mL/min — ABNORMAL LOW (ref >60)
Glucose, Bld: 83 mg/dL (ref 65–99)
Glucose, Bld: 129 mg/dL — ABNORMAL HIGH (ref 65–99)
Potassium: 5.5 mmol/L — ABNORMAL HIGH (ref 3.5–5.1)
Potassium: 5.1 mmol/L (ref 3.5–5.1)
SODIUM: 131 mmol/L — AB (ref 135–145)
Sodium: 132 mmol/L — ABNORMAL LOW (ref 135–145)

## 2017-10-30 LAB — GLUCOSE, CAPILLARY
GLUCOSE-CAPILLARY: 62 mg/dL — AB (ref 65–99)
Glucose-Capillary: 68 mg/dL (ref 65–99)
Glucose-Capillary: 72 mg/dL (ref 65–99)

## 2017-10-30 LAB — CBC
HEMATOCRIT: 31.2 % — AB (ref 36.0–46.0)
Hemoglobin: 10.1 g/dL — ABNORMAL LOW (ref 12.0–15.0)
MCH: 27.4 pg (ref 26.0–34.0)
MCHC: 32.4 g/dL (ref 30.0–36.0)
MCV: 84.6 fL (ref 78.0–100.0)
PLATELETS: 361 10*3/uL (ref 150–400)
RBC: 3.69 MIL/uL — ABNORMAL LOW (ref 3.87–5.11)
RDW: 19.2 % — AB (ref 11.5–15.5)
WBC: 24.1 10*3/uL — AB (ref 4.0–10.5)

## 2017-10-30 LAB — LACTIC ACID, PLASMA: LACTIC ACID, VENOUS: 1 mmol/L (ref 0.5–1.9)

## 2017-10-30 LAB — MAGNESIUM: Magnesium: 1.9 mg/dL (ref 1.7–2.4)

## 2017-10-30 IMAGING — CR DG CHEST 2V
2 series · 2 of 2 positions shown · non-contrast
Comparison: 10/06/2016

CLINICAL DATA: Chest wall pain

EXAM:
CHEST  2 VIEW

[w chest pa]
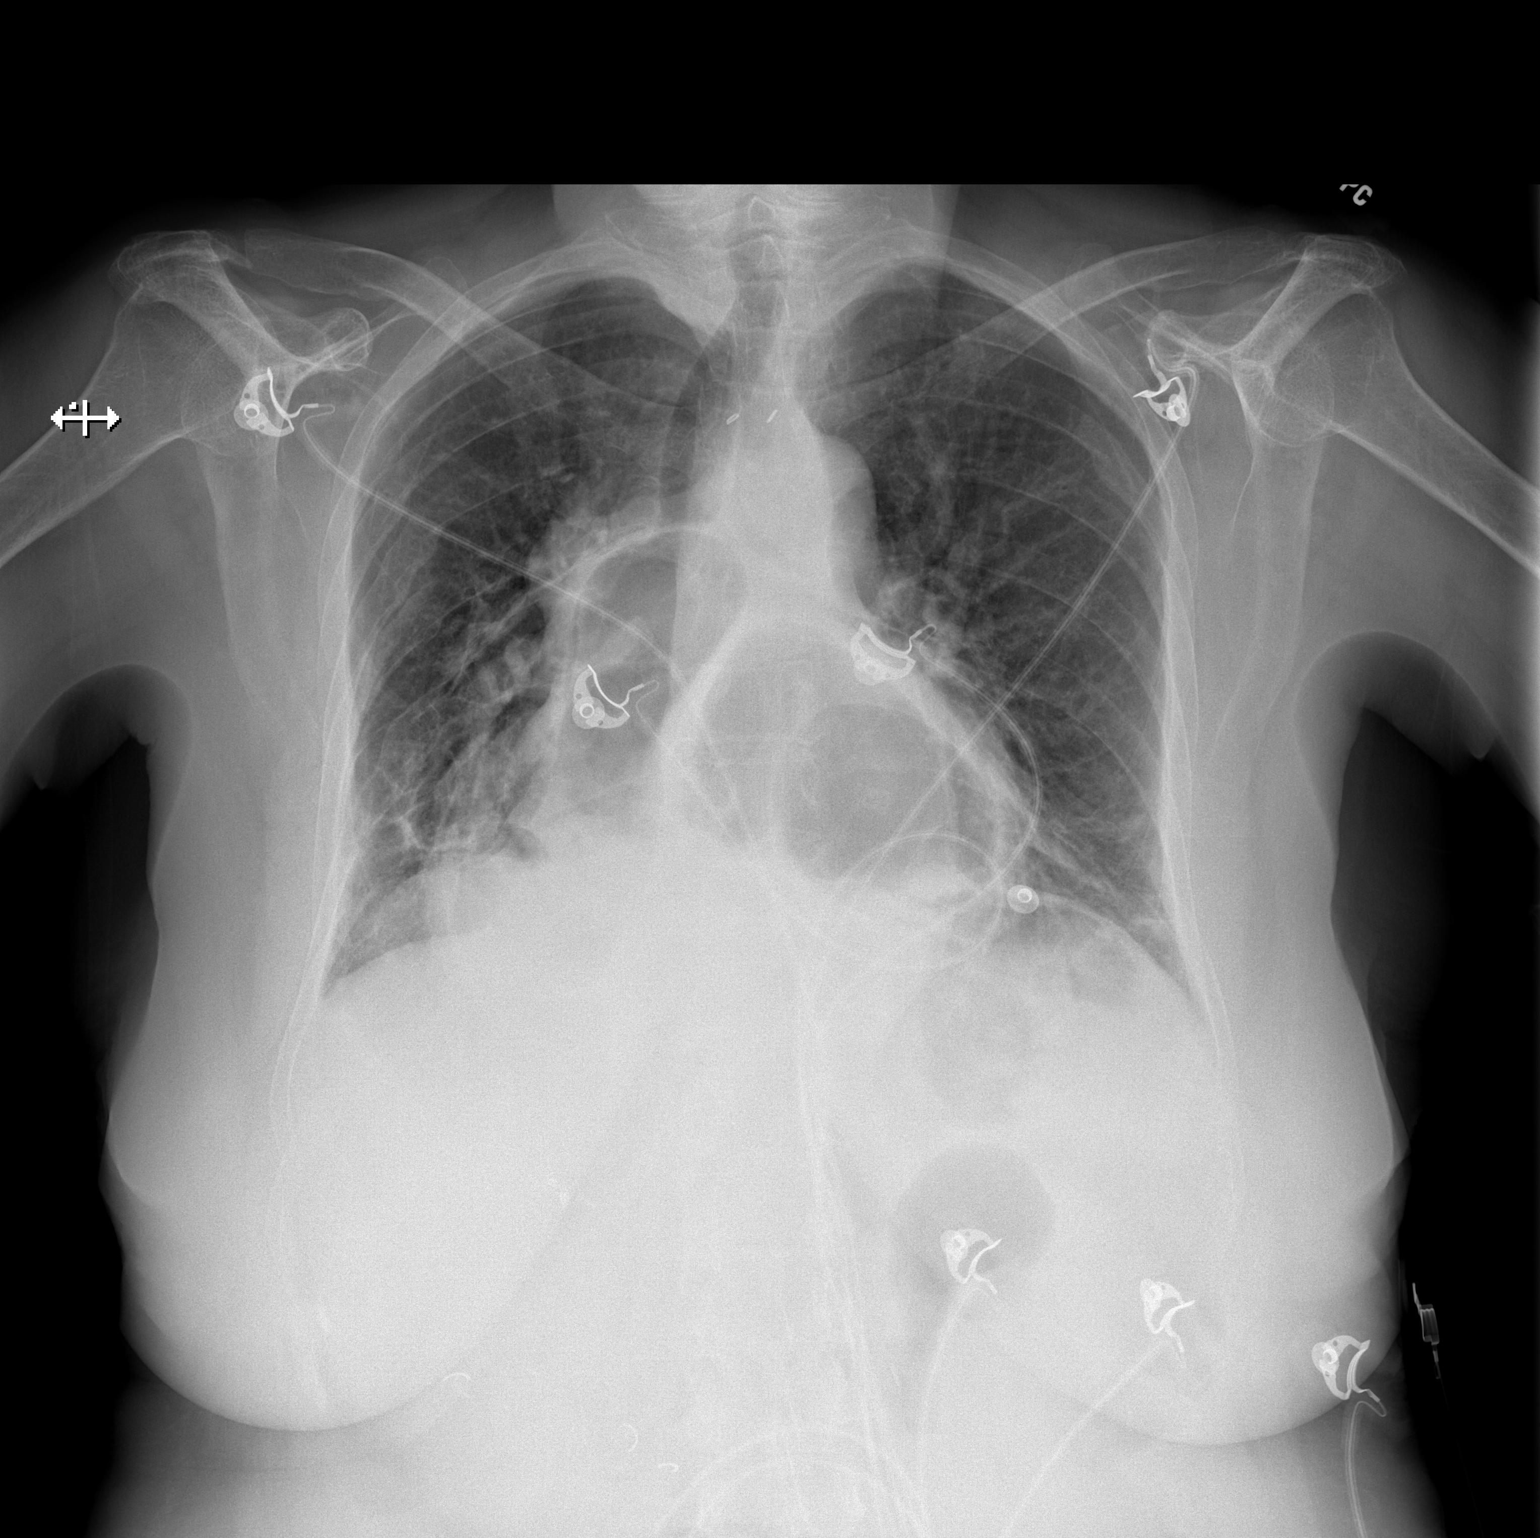

[w chest lat]
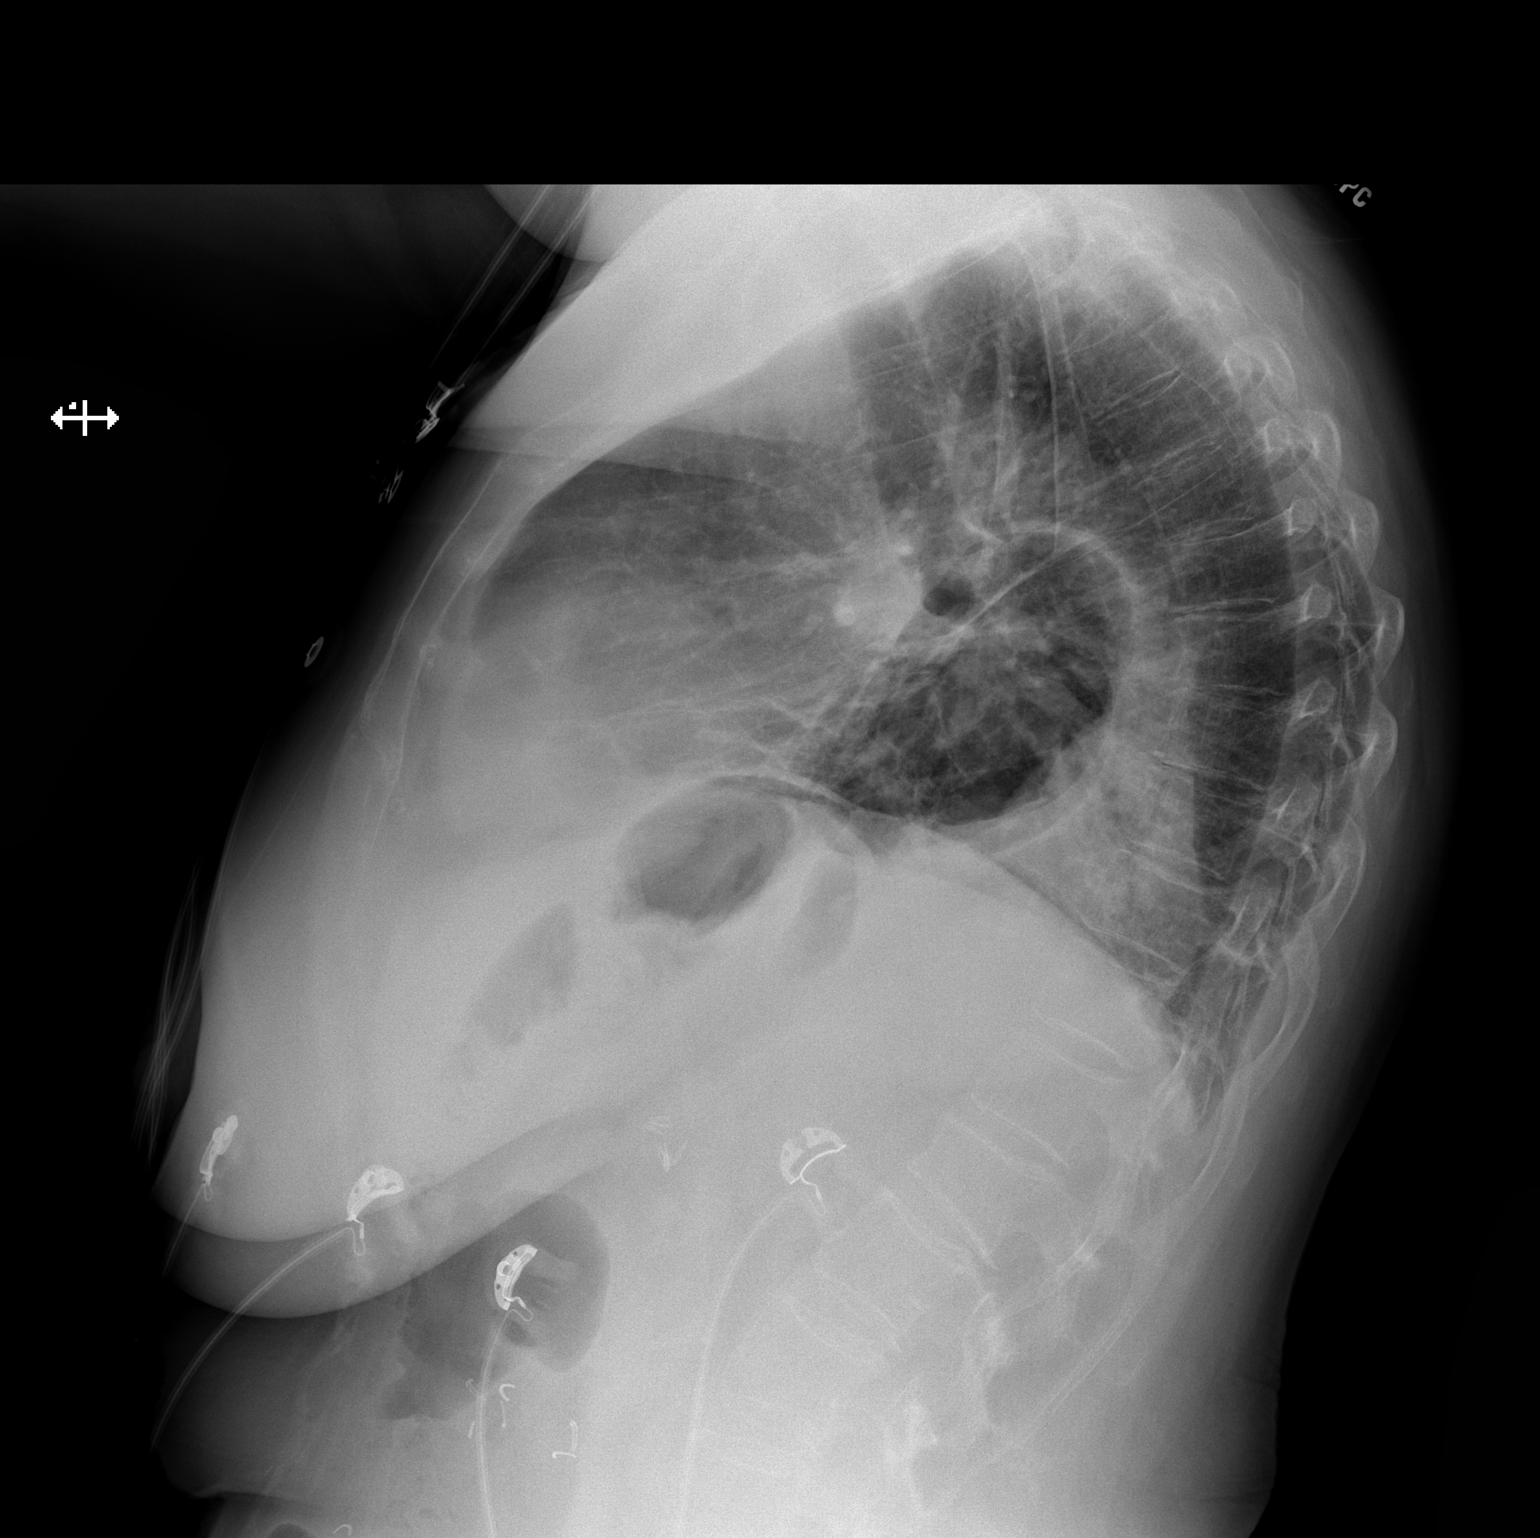

[2 of 2 positions shown; findings below may reference images not displayed]

FINDINGS: Very large hiatal hernia, not quite as large as on the prior study.

Bibasilar atelectasis has progressed in the interval. No pleural
effusion. Negative for heart failure.
IMPRESSION: Very large hiatal hernia, partially decompressed since the prior
chest x-ray

Progression of bibasilar atelectasis/ infiltrate.  No effusion.

## 2017-10-30 MED ORDER — SODIUM CHLORIDE 0.9 % IV BOLUS (SEPSIS)
1000.0000 mL | Freq: Once | INTRAVENOUS | Status: AC
  Administered 2017-10-30: 1000 mL via INTRAVENOUS

## 2017-10-30 MED ORDER — ACETAMINOPHEN 10 MG/ML IV SOLN
1000.0000 mg | Freq: Four times a day (QID) | INTRAVENOUS | Status: AC | PRN
  Administered 2017-10-30 – 2017-10-31 (×4): 1000 mg via INTRAVENOUS
  Filled 2017-10-30 (×4): qty 100

## 2017-10-30 MED ORDER — DEXTROSE 50 % IV SOLN
INTRAVENOUS | Status: AC
  Administered 2017-10-30: 25 mL
  Filled 2017-10-30: qty 50

## 2017-10-30 MED ORDER — DEXTROSE 5 % IV SOLN
INTRAVENOUS | Status: DC
  Administered 2017-10-30: 17:00:00 via INTRAVENOUS

## 2017-10-30 MED ORDER — DEXTROSE 50 % IV SOLN
INTRAVENOUS | Status: AC
  Administered 2017-10-30: 50 mL via INTRAVENOUS
  Filled 2017-10-30: qty 50

## 2017-10-30 MED ORDER — BLISTEX MEDICATED EX OINT
TOPICAL_OINTMENT | CUTANEOUS | Status: DC | PRN
  Filled 2017-10-30: qty 6.3

## 2017-10-30 MED ORDER — DEXTROSE 50 % IV SOLN
1.0000 | Freq: Once | INTRAVENOUS | Status: AC
  Administered 2017-10-30: 50 mL via INTRAVENOUS

## 2017-10-30 MED ORDER — TRAVASOL 10 % IV SOLN
INTRAVENOUS | Status: AC
  Administered 2017-10-30: 19:00:00 via INTRAVENOUS
  Filled 2017-10-30 (×2): qty 840

## 2017-10-30 NOTE — Progress Notes (Addendum)
INFECTIOUS DISEASE PROGRESS NOTE  ID: Danielle [name] is a 71 y.o. female with  Principal Problem:   Septic shock (Danielle [name]) Active Problems:   Leukocytosis   Perianal Crohn's disease, with fistula (Danielle [name])   Acute renal failure superimposed on stage 3 chronic kidney disease (Danielle [name])   Hiatal hernia   Immunosuppressed status (Danielle [name])   Chest tube in place   Hypotension   Vomiting in adult patient   S/P thoracentesis   Anasarca   DVT (deep venous thrombosis) (Danielle [name])   Protein-calorie malnutrition, moderate (Danielle [name])   Mediastinal abscess (Danielle [name])   Mediastinal post-op seromas   Pleural effusion, left   Patient's noncompliance with intervention / medical advice    Dehydration   Acute embolism and thrombosis of left axillary vein (Danielle [name])   Goals of care, counseling/discussion   Palliative care encounter   Fever   Intra-abdominal infection   Hemoptysis   Abscess   Intractable vomiting with nausea  Subjective: Scared to eat due to n/v Wants anti-emetic prior to eating.   Abtx:  Anti-infectives (From admission, onward)   Start     Dose/Rate Route Frequency Ordered Stop   10/27/17 1400  meropenem (MERREM) 1 g in sodium chloride 0.9 % 100 mL IVPB     1 g 200 mL/hr over 30 Minutes Intravenous Every 8 hours 10/27/17 1245     10/27/17 1100  levofloxacin (LEVAQUIN) IVPB 750 mg  Status:  Discontinued     750 mg 100 mL/hr over 90 Minutes Intravenous Every 24 hours 10/27/17 1041 10/27/17 1218   10/27/17 0400  piperacillin-tazobactam (ZOSYN) IVPB 3.375 g  Status:  Discontinued     3.375 g 12.5 mL/hr over 240 Minutes Intravenous Every 8 hours 10/26/17 2201 10/27/17 1222   10/22/17 0600  vancomycin (VANCOCIN) IVPB 1000 mg/200 mL premix  Status:  Discontinued     1,000 mg 200 mL/hr over 60 Minutes Intravenous To Short Stay 10/20/2017 1631 10/12/2017 1637   11/03/2017 2100  vancomycin (VANCOCIN) IVPB 1000 mg/200 mL premix     1,000 mg 200 mL/hr over 60 Minutes Intravenous Every 12 hours 10/17/2017 1631 10/23/2017  2246   11/04/2017 0900  vancomycin (VANCOCIN) IVPB 1000 mg/200 mL premix     1,000 mg 200 mL/hr over 60 Minutes Intravenous To Surgery 11/04/2017 0857 10/23/2017 0957   10/17/17 0800  vancomycin (VANCOCIN) IVPB 750 mg/150 ml premix  Status:  Discontinued     750 mg 150 mL/hr over 60 Minutes Intravenous Every 24 hours 10/16/17 1220 10/17/17 1023   10/16/17 1800  anidulafungin (ERAXIS) 100 mg in sodium chloride 0.9 % 100 mL IVPB     100 mg 78 mL/hr over 100 Minutes Intravenous Every 24 hours 10/15/17 1657     10/16/17 0600  vancomycin (VANCOCIN) IVPB 1000 mg/200 mL premix  Status:  Discontinued     1,000 mg 200 mL/hr over 60 Minutes Intravenous Every 36 hours 10/13/2017 2308 10/16/17 1220   10/15/17 1800  anidulafungin (ERAXIS) 200 mg in sodium chloride 0.9 % 200 mL IVPB     200 mg 78 mL/hr over 200 Minutes Intravenous  Once 10/15/17 1657 10/15/17 2148   10/15/2017 2330  piperacillin-tazobactam (ZOSYN) IVPB 3.375 g  Status:  Discontinued     3.375 g 100 mL/hr over 30 Minutes Intravenous Every 8 hours 10/24/2017 2257 11/02/2017 2259   10/18/2017 2315  piperacillin-tazobactam (ZOSYN) IVPB 3.375 g  Status:  Discontinued     3.375 g 12.5 mL/hr over 240 Minutes Intravenous Every 8 hours 10/13/2017 2300  10/26/17 2201   10/13/2017 1930  vancomycin (VANCOCIN) IVPB 1000 mg/200 mL premix     1,000 mg 200 mL/hr over 60 Minutes Intravenous  Once 10/11/2017 1915 10/16/2017 2044   10/07/2017 1530  piperacillin-tazobactam (ZOSYN) IVPB 3.375 g     3.375 g 100 mL/hr over 30 Minutes Intravenous  Once 10/16/2017 1529 10/10/2017 1604      Medications:  Scheduled: . acetaminophen  1,000 mg Oral Q6H   Or  . acetaminophen (TYLENOL) oral liquid 160 mg/5 mL  1,000 mg Oral Q6H  . feeding supplement (PRO-STAT SUGAR FREE 64)  30 mL Oral BID  . fentaNYL   Intravenous Q4H  . pantoprazole  40 mg Intravenous Q12H  . promethazine  6.25 mg Intravenous TID AC  . sodium chloride flush  5 mL Intravenous Q8H    Objective: Vital signs in last  24 hours: Temp:  [98.4 F (36.9 C)-103.1 F (39.5 C)] 99.9 F (37.7 C) (11/24 1100) Pulse Rate:  [92-117] 109 (11/24 0802) Resp:  [18-43] 22 (11/24 0820) BP: (104-170)/(56-88) 158/88 (11/24 0802) SpO2:  [93 %-100 %] 99 % (11/24 1100)   General appearance: alert, cooperative, cachectic and no distress Resp: diminished breath sounds anterior - bilateral and rhonchi anterior - right Chest wall: chest tube site on L chest, lateral. tender. purulent d/c in tube.  Cardio: tachycardia GI: normal findings: bowel sounds normal, soft, non-tender and xyphoid/subxyphoid drain in place.  Extremities: edema R elbow. warmth. no distinct cordis felt.  and PIC line LUE- clean, non-tender, no d/c.   Lab Results Recent Labs    10/29/17 0315 10/30/17 0845  WBC 20.6* 24.1*  HGB 8.9* 10.1*  HCT 28.0* 31.2*  NA 135 132*  K 4.6 5.5*  CL 106 105  CO2 22 19*  BUN 18 21*  CREATININE 0.88 0.86   Liver Panel Recent Labs    10/28/17 0500  PROT 6.0*  ALBUMIN 1.3*  AST 21  ALT 16  ALKPHOS 120  BILITOT 0.3   Sedimentation Rate No results for input(s): ESRSEDRATE in the last 72 hours. C-Reactive Protein No results for input(s): CRP in the last 72 hours.  Microbiology: Recent Results (from the past 240 hour(s))  Culture, Urine     Status: None   Collection Time: 10/20/17  5:06 PM  Result Value Ref Range Status   Specimen Description URINE, CATHETERIZED  Final   Special Requests NONE  Final   Culture   Final    NO GROWTH Performed at [facility], [address] [place], [place] [address]    Report Status 10/22/2017 FINAL  Final  Surgical PCR screen     Status: None   Collection Time: 10/19/2017  3:21 AM  Result Value Ref Range Status   MRSA, PCR NEGATIVE NEGATIVE Final   Staphylococcus aureus NEGATIVE NEGATIVE Final    Comment: (NOTE) The Xpert SA Assay (FDA approved for NASAL specimens in patients 35 years of age and older), is one component of a comprehensive surveillance  program. It is not intended to diagnose infection nor to guide or monitor treatment.   Body fluid culture     Status: None   Collection Time: 10/23/2017  9:02 AM  Result Value Ref Range Status   Specimen Description FLUID  Final   Special Requests MEDISTINAL PATIENT ON FOLLOWING ZOSYN AND VANC  Final   Gram Stain   Final    MODERATE WBC PRESENT,BOTH PMN AND MONONUCLEAR NO ORGANISMS SEEN    Culture NO GROWTH 3  DAYS  Final   Report Status 10/24/2017 FINAL  Final  Body fluid culture     Status: None   Collection Time: 10/07/2017  9:02 AM  Result Value Ref Range Status   Specimen Description FLUID PLEURAL LEFT  Final   Special Requests PATIENT ON FOLLOWING ZOSYN AND VANC  Final   Gram Stain   Final    MODERATE WBC PRESENT, PREDOMINANTLY MONONUCLEAR NO ORGANISMS SEEN    Culture NO GROWTH 3 DAYS  Final   Report Status 10/24/2017 FINAL  Final  Fungus Culture With Stain     Status: None (Preliminary result)   Collection Time: 10/31/2017  9:02 AM  Result Value Ref Range Status   Fungus Stain Final report  Final    Comment: (NOTE) Performed At: [facility] [place], [place] [id number] Rush Farmer MD [id number]    Fungus (Mycology) Culture PENDING  Incomplete   Fungal Source FLUID  Final    Comment: PLEURAL LEFT PATIENT ON FOLLOWING ZOSYN AND VANC   Acid Fast Smear (AFB)     Status: None   Collection Time: 10/30/2017  9:02 AM  Result Value Ref Range Status   AFB Specimen Processing Concentration  Final   Acid Fast Smear Negative  Final    Comment: (NOTE) Performed At: [facility] [place], [place] [id number] Rush Farmer MD [id number]    Source (AFB) FLUID  Final    Comment: PLEURAL LEFT PATIENT ON FOLLOWING ZOSYN AND VANC   Fungus Culture Result     Status: None   Collection Time: 10/28/2017  9:02 AM  Result Value Ref Range Status   Result 1 Comment  Final    Comment: (NOTE) KOH/Calcofluor preparation:  no fungus  observed. Performed At: [facility] [place], [place] [id number] Rush Farmer MD [id number]   Fungus Culture With Stain     Status: None (Preliminary result)   Collection Time: 11/04/2017  9:42 AM  Result Value Ref Range Status   Fungus Stain Final report  Final    Comment: (NOTE) Performed At: [facility] [place], [place] [id number] Rush Farmer MD [id number]    Fungus (Mycology) Culture PENDING  Incomplete   Fungal Source FLUID  Final    Comment: MEDISTINAL PATIENT ON FOLLOWING ZOSYN AND VANC    Acid Fast Smear (AFB)     Status: None   Collection Time: 10/10/2017  9:42 AM  Result Value Ref Range Status   AFB Specimen Processing Concentration  Final   Acid Fast Smear Negative  Final    Comment: (NOTE) Performed At: [facility] [place], [place] [id number] Rush Farmer MD [id number]    Source (AFB) FLUID  Final    Comment: MEDISTINAL PATIENT ON FOLLOWING ZOSYN AND VANC   Fungus Culture Result     Status: None   Collection Time: 10/11/2017  9:42 AM  Result Value Ref Range Status   Result 1 Comment  Final    Comment: (NOTE) KOH/Calcofluor preparation:  no fungus observed. Performed At: [facility] [place], [place] [id number] Rush Farmer MD PY:[id number]   Culture, blood (routine x 2)     Status: None   Collection Time: 10/24/17 12:06 AM  Result Value Ref Range Status   Specimen Description BLOOD LEFT FOOT  Final   Special Requests   Final    BOTTLES DRAWN AEROBIC AND ANAEROBIC Blood Culture adequate volume   Culture NO GROWTH 5 DAYS  Final   Report Status 10/29/2017 FINAL  Final  Culture, blood (routine x 2)     Status: None   Collection Time: 10/24/17 12:16 AM  Result Value Ref Range Status   Specimen Description BLOOD RIGHT FOOT  Final   Special Requests   Final    BOTTLES DRAWN AEROBIC ONLY Blood Culture results may not be optimal due to an inadequate  volume of blood received in culture bottles   Culture NO GROWTH 5 DAYS  Final   Report Status 10/29/2017 FINAL  Final  Fungus culture, blood     Status: None (Preliminary result)   Collection Time: 10/24/17 12:16 AM  Result Value Ref Range Status   Specimen Description BLOOD LEFT FOOT  Final   Special Requests   Final    BOTTLES DRAWN AEROBIC AND ANAEROBIC Blood Culture adequate volume   Culture NO GROWTH 5 DAYS  Final   Report Status PENDING  Incomplete  Culture, blood (routine x 2)     Status: None   Collection Time: 10/24/17  3:50 PM  Result Value Ref Range Status   Specimen Description BLOOD RIGHT FOOT  Final   Special Requests IN PEDIATRIC BOTTLE Blood Culture adequate volume  Final   Culture NO GROWTH 5 DAYS  Final   Report Status 10/29/2017 FINAL  Final  Culture, blood (routine x 2)     Status: None (Preliminary result)   Collection Time: 10/27/17  1:19 PM  Result Value Ref Range Status   Specimen Description BLOOD RIGHT HAND  Final   Special Requests IN PEDIATRIC BOTTLE Blood Culture adequate volume  Final   Culture NO GROWTH 2 DAYS  Final   Report Status PENDING  Incomplete  Culture, blood (routine x 2)     Status: None (Preliminary result)   Collection Time: 10/27/17  1:24 PM  Result Value Ref Range Status   Specimen Description BLOOD RIGHT HAND  Final   Special Requests IN PEDIATRIC BOTTLE Blood Culture adequate volume  Final   Culture NO GROWTH 2 DAYS  Final   Report Status PENDING  Incomplete  Culture, Urine     Status: None   Collection Time: 10/27/17  4:44 PM  Result Value Ref Range Status   Specimen Description URINE, RANDOM  Final   Special Requests NONE  Final   Culture NO GROWTH  Final   Report Status 10/28/2017 FINAL  Final  Aerobic/Anaerobic Culture (surgical/deep wound)     Status: None (Preliminary result)   Collection Time: 10/29/17  4:17 PM  Result Value Ref Range Status   Specimen Description ABSCESS  Final   Special Requests ABDOMEN  Final   Gram  Stain   Final    ABUNDANT WBC PRESENT, PREDOMINANTLY PMN RARE GRAM POSITIVE COCCI IN PAIRS RARE BUDDING YEAST SEEN    Culture PENDING  Incomplete   Report Status PENDING  Incomplete  C difficile quick scan w PCR reflex     Status: None   Collection Time: 10/29/17  4:25 PM  Result Value Ref Range Status   C Diff antigen NEGATIVE NEGATIVE Final   C Diff toxin NEGATIVE NEGATIVE Final   C Diff interpretation No C. difficile detected.  Final    Studies/Results: US Renal  Result Date: 10/28/2017 CLINICAL DATA:  Urinary retention EXAM: LIMITED RENAL ULTRASOUND COMPARISON:  None. FINDINGS: Right Kidney: Length: 11.1 cm. Echogenicity and renal cortical thickness are within normal limits. No mass, perinephric fluid, or hydronephrosis visualized. No sonographically demonstrable calculus or ureterectasis. Left Kidney:  Patient refused to allow imaging of left flank. Bladder: Decompressed with Foley catheter. IMPRESSION: Limited study as patient refused to allow imaging of the left flank. Right kidney appears unremarkable. Urinary bladder decompressed with Foley catheter and cannot be assessed. Electronically Signed   By: Lowella Grip III M.D.   On: 10/28/2017 13:32   Ct Image Guided Drainage By Percutaneous Catheter  Result Date: 10/29/2017 INDICATION: 71 year old with a persistent anterior abdominal fluid collection near the falciform ligament of the liver. This fluid collection has been present for quite a while but the patient continues to have fevers. Complex medical history including Crohn's disease and abdominal abscess collections. EXAM: CT GUIDED DRAINAGE OF ANTERIOR ABDOMINAL ABSCESS MEDICATIONS: None ANESTHESIA/SEDATION: 4 mg IV Versed 20 mcg IV Fentanyl Moderate Sedation Time:  23 minutes The patient was continuously monitored during the procedure by the interventional radiology nurse under my direct supervision. COMPLICATIONS: None immediate. TECHNIQUE: Informed written consent was  obtained from the patient after a thorough discussion of the procedural risks, benefits and alternatives. All questions were addressed. Maximal Sterile Barrier Technique was utilized including caps, mask, sterile gowns, sterile gloves, sterile drape, hand hygiene and skin antiseptic. A timeout was performed prior to the initiation of the procedure. PROCEDURE: Patient was placed supine in the CT scanner. CT images through the anterior abdomen were obtained. Anterior abdomen was prepped with chlorhexidine. Sterile field was created. Skin was anesthetized with 1% lidocaine. Using CT guidance, 18 gauge trocar needle was directed into the small fluid collection along the inferior left hepatic lobe. Thick brown fluid was aspirated. A stiff Amplatz wire was advanced into the collection. The tract was dilated to accommodate a 10.2 [place] multipurpose drain. Catheter was attached to a suction bulb. Catheter was sutured to skin. FINDINGS: Small fluid collection along the inferior aspect of the left hepatic lobe. Thick brown fluid was aspirated from the collection. 10 French drain was placed. IMPRESSION: CT-guided placement of a drainage catheter within the small fluid collection along the inferior aspect of the left hepatic lobe. Fluid was sent for culture. Electronically Signed   By: Markus Daft M.D.   On: 10/29/2017 17:36     Assessment/Plan: Hiatal hernia repair 10-3 Intra-abd abscess  IR drain 11-23 Mediastinal abscess (VATS 11-15) Fungemia (C glabrata) Protein calorie malnutrition, severe.   TPN Skin breakdown sacrum RUE DVT Crohn's disease Palliative Care following  Total days of antibiotics: 16  11/8 Vancomycin  >> 11/11; 11/15 >> 11/16 (surg ppx) 11/8 Zosyn  >> 11/21 11/21 Levaquin>> 11/21  11/9 Anidulafungin >> 11/21 Meropenem >>  Her fever has improved somewhat over last 24h (down from 103 to 101). She is on a cooling blanket.  She has worsened leukocytosis, over last 4 days (however was as  high as 31.8 on 11-19).  Her abscess Cx from 11-23 aspirate continues to show yeast and GPC.  I would continue her eraxis and merrem.  She has not shown staph on her Cx so would not add vanco. Her line could certainly be a culprit (although it looks clean).  Give her nutrition as able, since she has cleared her BCx, TPN ok.  Suspect she will continue to have fever while she has abscesses.  D/i Family, Dr Tyrell Antonio         Bobby Rumpf MD, FACP Infectious Diseases (pager) [phone] [email or web address] 10/30/2017, 12:01 PM  LOS: 16 days

## 2017-10-30 NOTE — Progress Notes (Signed)
[facility] Progress Note  Subjective: No specific complaints today. No abdominal pain.2    Objective: Vital signs in last 24 hours: Temp:  [98.4 F (36.9 C)-103.1 F (39.5 C)] 101 F (38.3 C) (11/24 0802) Pulse Rate:  [92-117] 109 (11/24 0802) Resp:  [18-43] 22 (11/24 0820) BP: (90-170)/(51-88) 158/88 (11/24 0802) SpO2:  [93 %-100 %] 97 % (11/24 0820) Weight change:    PE:  No distress  Abdomen soft and nontender  Lab Results: Results for orders placed or performed during the hospital encounter of 10/18/2017 (from the past 24 hour(s))  Protime-INR     Status: Abnormal   Collection Time: 10/29/17 11:15 AM  Result Value Ref Range   Prothrombin Time 15.5 (H) 11.4 - 15.2 seconds   INR 1.24   Aerobic/Anaerobic Culture (surgical/deep wound)     Status: None (Preliminary result)   Collection Time: 10/29/17  4:17 PM  Result Value Ref Range   Specimen Description ABSCESS    Special Requests ABDOMEN    Gram Stain      ABUNDANT WBC PRESENT, PREDOMINANTLY PMN RARE GRAM POSITIVE COCCI IN PAIRS RARE BUDDING YEAST SEEN    Culture PENDING    Report Status PENDING   C difficile quick scan w PCR reflex     Status: None   Collection Time: 10/29/17  4:25 PM  Result Value Ref Range   C Diff antigen NEGATIVE NEGATIVE   C Diff toxin NEGATIVE NEGATIVE   C Diff interpretation No C. difficile detected.   Glucose, capillary     Status: Abnormal   Collection Time: 10/29/17  8:19 PM  Result Value Ref Range   Glucose-Capillary 103 (H) 65 - 99 mg/dL   Comment 1 Notify RN   CBC     Status: Abnormal   Collection Time: 10/30/17  8:45 AM  Result Value Ref Range   WBC 24.1 (H) 4.0 - 10.5 K/uL   RBC 3.69 (L) 3.87 - 5.11 MIL/uL   Hemoglobin 10.1 (L) 12.0 - 15.0 g/dL   HCT 31.2 (L) 36.0 - 46.0 %   MCV 84.6 78.0 - 100.0 fL   MCH 27.4 26.0 - 34.0 pg   MCHC 32.4 30.0 - 36.0 g/dL   RDW 19.2 (H) 11.5 - 15.5 %   Platelets 361 150 - 400 K/uL  Basic metabolic panel     Status: Abnormal    Collection Time: 10/30/17  8:45 AM  Result Value Ref Range   Sodium 132 (L) 135 - 145 mmol/L   Potassium 5.5 (H) 3.5 - 5.1 mmol/L   Chloride 105 101 - 111 mmol/L   CO2 19 (L) 22 - 32 mmol/L   Glucose, Bld 83 65 - 99 mg/dL   BUN 21 (H) 6 - 20 mg/dL   Creatinine, Ser 0.86 0.44 - 1.00 mg/dL   Calcium 8.4 (L) 8.9 - 10.3 mg/dL   GFR calc non Af Amer >60 >60 mL/min   GFR calc Af Amer >60 >60 mL/min   Anion gap 8 5 - 15    Studies/Results: Ct Image Guided Drainage By Percutaneous Catheter  Result Date: 10/29/2017 INDICATION: 71 year old with a persistent anterior abdominal fluid collection near the falciform ligament of the liver. This fluid collection has been present for quite a while but the patient continues to have fevers. Complex medical history including Crohn's disease and abdominal abscess collections. EXAM: CT GUIDED DRAINAGE OF ANTERIOR ABDOMINAL ABSCESS MEDICATIONS: None ANESTHESIA/SEDATION: 4 mg IV Versed 20 mcg IV Fentanyl Moderate Sedation Time:  23 minutes  The patient was continuously monitored during the procedure by the interventional radiology nurse under my direct supervision. COMPLICATIONS: None immediate. TECHNIQUE: Informed written consent was obtained from the patient after a thorough discussion of the procedural risks, benefits and alternatives. All questions were addressed. Maximal Sterile Barrier Technique was utilized including caps, mask, sterile gowns, sterile gloves, sterile drape, hand hygiene and skin antiseptic. A timeout was performed prior to the initiation of the procedure. PROCEDURE: Patient was placed supine in the CT scanner. CT images through the anterior abdomen were obtained. Anterior abdomen was prepped with chlorhexidine. Sterile field was created. Skin was anesthetized with 1% lidocaine. Using CT guidance, 18 gauge trocar needle was directed into the small fluid collection along the inferior left hepatic lobe. Thick brown fluid was aspirated. A stiff  Amplatz wire was advanced into the collection. The tract was dilated to accommodate a 10.2 [place] multipurpose drain. Catheter was attached to a suction bulb. Catheter was sutured to skin. FINDINGS: Small fluid collection along the inferior aspect of the left hepatic lobe. Thick brown fluid was aspirated from the collection. 10 French drain was placed. IMPRESSION: CT-guided placement of a drainage catheter within the small fluid collection along the inferior aspect of the left hepatic lobe. Fluid was sent for culture. Electronically Signed   By: Markus Daft M.D.   On: 10/29/2017 17:36      Assessment: #1. Fluid collections around splenic and pelvis suspicious for abscesses  #2. Crohn's disease with thickening of distal ileum, dilatation of several small bowel loops, stricture formation, left-sided trans-sphincteric perianal abscess  3 mediastinal fluid collections being drained  Plan:   At this time from a GI standpoint as per previous note she is not a candidate for steroids or Biologics due to current medical status and ongoing infection and abscesses.    Cassell Clement 10/30/2017, 9:44 AM  Pager: [phone] If no answer or after 5 PM call [phone]

## 2017-10-30 NOTE — Plan of Care (Signed)
Continue current care plan 

## 2017-10-30 NOTE — Progress Notes (Addendum)
[address]       [employer] [address]             [phone]      9 Days Post-Op Procedure(s) (LRB): LEFT VIDEO ASSISTED THORACOSCOPY (VATS) for MEDISTINAL DRAINAGE (Left) Subjective: Feels somewhat better, remains febrile  Objective: Vital signs in last 24 hours: Temp:  [98.4 F (36.9 C)-103.1 F (39.5 C)] 101 F (38.3 C) (11/24 0802) Pulse Rate:  [92-117] 109 (11/24 0802) Cardiac Rhythm: Sinus tachycardia (11/24 0800) Resp:  [18-43] 22 (11/24 0820) BP: (90-170)/(51-86) 109/56 (11/24 0406) SpO2:  [93 %-100 %] 97 % (11/24 0820)  Hemodynamic parameters for last 24 hours:    Intake/Output from previous day: 11/23 0701 - 11/24 0700 In: 1241.1 [I.V.:711.1; IV Piggyback:530] Out: 7209 [Urine:4200; Drains:10; Stool:600; Chest Tube:80] Intake/Output this shift: Total I/O In: -  Out: 1000 [Urine:1000]  General appearance: alert, cooperative and fatigued Heart: regular rate and rhythm Lungs: mostly clear anteriorly Abdomen: soft, nontender Extremities: no edema or calf tenderness Wound: unable to observe as she cant turn over easily  Lab Results: Recent Labs    10/28/17 0410 10/29/17 0315  WBC 18.3* 20.6*  HGB 8.6* 8.9*  HCT 27.4* 28.0*  PLT 280 286   BMET:  Recent Labs    10/28/17 0500 10/29/17 0315  NA 135 135  K 4.5 4.6  CL 107 106  CO2 22 22  GLUCOSE 110* 109*  BUN 18 18  CREATININE 0.87 0.88  CALCIUM 7.8* 8.2*    PT/INR:  Recent Labs    10/29/17 1115  LABPROT 15.5*  INR 1.24   ABG    Component Value Date/Time   PHART 7.317 (L) 10/22/2017 0420   HCO3 17.9 (L) 10/22/2017 0420   TCO2 15 (L) 09/10/2017 1214   ACIDBASEDEF 7.1 (H) 10/22/2017 0420   O2SAT 96.1 10/22/2017 0420   CBG (last 3)  Recent Labs    10/29/17 0108 10/29/17 0645 10/29/17 2019  GLUCAP 115* 96 103*    Meds Scheduled Meds: . acetaminophen  1,000 mg Oral Q6H   Or  . acetaminophen (TYLENOL) oral liquid 160 mg/5 mL  1,000 mg Oral Q6H  .  feeding supplement (PRO-STAT SUGAR FREE 64)  30 mL Oral BID  . fentaNYL   Intravenous Q4H  . lip balm  1 application Topical BID  . pantoprazole  40 mg Intravenous Q12H  . promethazine  6.25 mg Intravenous TID AC  . sodium chloride flush  5 mL Intravenous Q8H   Continuous Infusions: . acetaminophen    . anidulafungin Stopped (10/29/17 1923)  . meropenem (MERREM) IV Stopped (10/30/17 0531)  . potassium chloride Stopped (10/22/17 1145)  . TPN CYCLIC-ADULT (ION) 99 mL/hr at 10/29/17 1913   PRN Meds:.acetaminophen, diphenhydrAMINE **OR** diphenhydrAMINE, fentaNYL (SUBLIMAZE) injection, hydrocortisone, liver oil-zinc oxide, naloxone **AND** sodium chloride flush, nystatin, oxyCODONE, phenol, potassium chloride, prochlorperazine, promethazine, sodium chloride flush, traMADol  Xrays Ct Abdomen Pelvis W Contrast  Result Date: 10/28/2017 CLINICAL DATA:  Abdominal pain, nausea, and fever.  Crohn's disease. EXAM: CT ABDOMEN AND PELVIS WITH CONTRAST TECHNIQUE: Multidetector CT imaging of the abdomen and pelvis was performed using the standard protocol following bolus administration of intravenous contrast. CONTRAST:  166mL ISOVUE-300 IOPAMIDOL (ISOVUE-300) INJECTION 61% COMPARISON:  10/18/2017 FINDINGS: Lower Chest: Increased size of moderate right pleural effusion. Small left pleural effusion is decreased in size. Left chest tube is now seen, with distal tip seen in the inferior middle mediastinum adjacent to hiatal hernia. Previously seen  inferior mediastinal fluid collection surrounding the distal esophagus and small hiatal hernia is nearly completely resolved since previous study. Persistent inflammatory changes seen in the inferior middle mediastinum adjacent to the hiatal hernia. Hepatobiliary: No hepatic masses identified. Prior cholecystectomy. No evidence of biliary obstruction. Pancreas:  No mass or inflammatory changes. Spleen: No evidence of splenomegaly. Small complex fluid collection seen along  the posterosuperior margin the spleen measures 3.6 x 1.4 cm on image 20/3, without significant change compared to previous study. Adrenals/Urinary Tract: No masses identified. No evidence of hydronephrosis. Foley catheter seen within urinary bladder which is decompressed. Stomach/Bowel: Rectal tube seen in place. Postop changes from previous distal small bowel and right colon resection seen. Mild increase in moderate wall thickening is seen involving the distal ileum extending to the ileocolic anastomosis, with mild adjacent mesenteric inflammatory changes. Mild dilatation of small bowel loops proximal to this site noted within the pelvis. This is consistent with Crohn's disease with stricture and partial distal small bowel obstruction. No evidence of abscess. 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 Vascular/Lymphatic: No pathologically enlarged lymph nodes. No abdominal aortic aneurysm. Reproductive: Persistent rim enhancing fluid collection in pelvic cul-de-sac measuring 4.5 x 2.7 cm, without significant change compared to prior exam. This is suspicious for abscess. Other:  None. Musculoskeletal:  No suspicious bone lesions identified. IMPRESSION: Near complete resolution of inferior mediastinal fluid collection/abscess surrounding the distal esophagus and small hiatal hernia and decreased small left pleural effusion, following chest tube placement. Increased moderate right pleural effusion and right basilar atelectasis. Mild increase in wall thickening and adjacent inflammatory changes involving the distal ileum, with mild dilatation of several small bowel loops proximal to this site. This is consistent with Crohn's disease and stricture formation. Stable small rim enhancing fluid collections along posterosuperior margin of the spleen and pelvic cul-de-sac, suspicious for abscesses. Left-sided transsphincteric perianal fistula. CONTRAST EXTRAVASATION CONSULTATION: Type of contrast:   Isovue 300 Site of extravasation: Upper right forearm Estimated volume of extravasation: 50 ml Area of extravasation scanned with CT? no PATIENT'S SIGNS AND SYMPTOMS Skin blistering/ulceration: no Decrease capillary refill: no Change in skin color: no Decreased motor function or severe tightness: no Decreased pulses distal to site of extravasation: no Altered sensation: no Increasing pain or signs of increased swelling during observation: no TREATMENT Observation period at site: 20 minutes Limb elevation: yes Ice packs applied: yes Heat pads applied: No Plastic surgery consulted? no DOCUMENTATION AND FOLLOW-UP Site contrast extravasation forms submitted? yes Post extravasation orders completed? yes Was additional follow up assigned to PA's? Yes Patient's questions answered? yes Patient instructed to call [phone] or seek immediate medical care if symptoms progress. Electronically Signed   By: Earle Gell M.D.   On: 10/28/2017 12:56   US Renal  Result Date: 10/28/2017 CLINICAL DATA:  Urinary retention EXAM: LIMITED RENAL ULTRASOUND COMPARISON:  None. FINDINGS: Right Kidney: Length: 11.1 cm. Echogenicity and renal cortical thickness are within normal limits. No mass, perinephric fluid, or hydronephrosis visualized. No sonographically demonstrable calculus or ureterectasis. Left Kidney: Patient refused to allow imaging of left flank. Bladder: Decompressed with Foley catheter. IMPRESSION: Limited study as patient refused to allow imaging of the left flank. Right kidney appears unremarkable. Urinary bladder decompressed with Foley catheter and cannot be assessed. Electronically Signed   By: Lowella Grip III M.D.   On: 10/28/2017 13:32   Ct Image Guided Drainage By Percutaneous Catheter  Result Date: 10/29/2017 INDICATION: 71 year old with a persistent anterior abdominal fluid collection near the falciform ligament  of the liver. This fluid collection has been present for quite a while but the patient  continues to have fevers. Complex medical history including Crohn's disease and abdominal abscess collections. EXAM: CT GUIDED DRAINAGE OF ANTERIOR ABDOMINAL ABSCESS MEDICATIONS: None ANESTHESIA/SEDATION: 4 mg IV Versed 20 mcg IV Fentanyl Moderate Sedation Time:  23 minutes The patient was continuously monitored during the procedure by the interventional radiology nurse under my direct supervision. COMPLICATIONS: None immediate. TECHNIQUE: Informed written consent was obtained from the patient after a thorough discussion of the procedural risks, benefits and alternatives. All questions were addressed. Maximal Sterile Barrier Technique was utilized including caps, mask, sterile gowns, sterile gloves, sterile drape, hand hygiene and skin antiseptic. A timeout was performed prior to the initiation of the procedure. PROCEDURE: Patient was placed supine in the CT scanner. CT images through the anterior abdomen were obtained. Anterior abdomen was prepped with chlorhexidine. Sterile field was created. Skin was anesthetized with 1% lidocaine. Using CT guidance, 18 gauge trocar needle was directed into the small fluid collection along the inferior left hepatic lobe. Thick brown fluid was aspirated. A stiff Amplatz wire was advanced into the collection. The tract was dilated to accommodate a 10.2 [place] multipurpose drain. Catheter was attached to a suction bulb. Catheter was sutured to skin. FINDINGS: Small fluid collection along the inferior aspect of the left hepatic lobe. Thick brown fluid was aspirated from the collection. 10 French drain was placed. IMPRESSION: CT-guided placement of a drainage catheter within the small fluid collection along the inferior aspect of the left hepatic lobe. Fluid was sent for culture. Electronically Signed   By: Markus Daft M.D.   On: 10/29/2017 17:36    Assessment/Plan: S/P Procedure(s) (LRB): LEFT VIDEO ASSISTED THORACOSCOPY (VATS) for MEDISTINAL DRAINAGE (Left)   1 no change in  management from CT surgery perspective. 2 remains critically ill  LOS: 16 days    [name] 10/30/2017   I have seen and examined the patient and agree with the assessment and plan as outlined.  Rexene Alberts, MD 10/30/2017 9:59 AM

## 2017-10-30 NOTE — Progress Notes (Signed)
Paged dr Ethelle Lyon regarding pt's cbg 16 and temp 103 rectal, cooling blanket remains on, tylenol iv given. New orders rec'd.

## 2017-10-30 NOTE — Progress Notes (Signed)
[facility] NOTE   Pharmacy Consult for TPN Indication: Poor PO intake / unable to place repeat G-tube   Patient Measurements: Height: 5' 4.5" (163.8 cm) Weight: 123 lb 0.3 oz (55.8 kg) IBW/kg (Calculated) : 55.85 TPN AdjBW (KG): 59 Body mass index is 20.79 kg/m.  Assessment:  56 yoF re-admit 11/8 with N/V, loose stools (baseline w/Crohn's). Was hospitalized for a month in October and developed fungemia and treated with Invanz and anidulafungin. Re-admitted a week later with intermittent fevers and difficultly swallowing. CT chest showed recurrent mediastinal fluid abscess.  PMH: Crohns, HH, hx SBO  Significant events:  11/15 Left VATs to drain mediastinum  11/23 IR guided perihepatic abscess drain placement   GI: Mediastinal abscess complex, had been advanced to full liquid diet but has nausea and vomiting with almost anything PO. Had 4 episodes of emesis earlier this week but volume not charted. Clear liquids for now. Patient has poor appetite and still experiencing nausea with any type of PO intake. Phenergan low dose d/t Qtc TID. Avoiding Zofran. Prn Compazine. Patient refuses feeding tube. Attempting full liquids today. GOC being assessed daily. Albumin low at 1.3. Last BM was 11/24.  Endo: CBGs controlled. 69-73 off TPN and 100-110s on cycle. Sliding scale insulin stopped on 11/21. Insulin requirements in the past 24 hours: 0 unit regular insulin  Lytes: Na low 132. K elevated 5.5. Others wnl. CoCa 10.4. Renal: SCr stable, BUN up 21. UOP 3.32mL/kg/hr. Net positive this admit but I/Os not all charted. I/O - 3.6L documented last 24 hours. Neuro: PCA- pain score 2. Pulm: RA; Bilateral chest tube output at 80 mL / 24 hours, chest tubes to remain in place for several days. R-effusion larger 11/23 - considering thoracentesis. Cards: BP elev-wnl, HR 90-110s, 5bts SVT yesterday Hepatobil: AST/ALT and Tbili wnl. Trig 83 ID: Merrem + Anidulafungin for  mediastinal abscess. Tmax of 103, WBC back up at 24.1. Drain in place. Cdiff negative. Abdominal abscess gm stain with rare GPC pairs and rare budding yeast. Culture still pending. 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 Last ID note 11/21 - continue TPN for now.   Best Practices: SCDs, PPI IV TPN Access: PICC 11/11>> TPN start date: 11/11>>  Nutritional Goals (per RD recommendation on 11/19): Kcal:  1500-1700 Protein:  75-90 grams  Fluid:  >/= 1.5 L/day  Current Nutrition:  Clear liquid (appetite poor)>> attempting full liquids 11/24 Custom TPN  Plan:  Continue cyclic TPN at 18 hr cycle. Infuse 1,680 mL over 18 hrs : 49 mL/hr x 1 hr, then 99 mL/hr x 14 hrs, then 49 mL/hr x 1 hr.  TPN provides 84 g of protein, 235 g of dextrose, and 50 g lipids providing 1,640 total kcal, which meets ~100% of patient needs.  Electrolytes in TPN: decrease potassium by half, continue max acetate AddMVI,trace elements in TPN Continue CBG checks for hypoglycemic events Monitor TPN labs, clinical progress  F/U ability to tolerate PO diet, GOC    Sloan Leiter, PharmD, BCPS, BCCCP Clinical Pharmacist Clinical phone 10/30/2017 until 3:30PM - #[phone] After hours, please call #[phone] 10/30/2017 7:56 AM

## 2017-10-30 NOTE — Progress Notes (Signed)
Pharmacy Antibiotic Note  Danielle [name] is a 71 y.o. female admitted on 10/26/2017 with sepsis thought to be 2/2 abdominal source.  Pharmacy has been consulted for meropenem dosing. Abscess culture shows rare budding yeast and GPC in pairs, C. Diff negative, all other recent cultures no growth. WBC remains elevated and pt continues intermittent fever spikes. ID following - recommends continued Eraxis and meropenem for now, will follow-up on Monday.  Plan: -Meropenem 1g IV q8h -Eraxis 100mg  IV q24h -F/U ID recommendations, watch renal funx  Height: 5' 4.5" (163.8 cm) Weight: 123 lb 0.3 oz (55.8 kg) IBW/kg (Calculated) : 55.85  Temp (24hrs), Avg:100.9 F (38.3 C), Min:98.4 F (36.9 C), Max:103.1 F (39.5 C)  Recent Labs  Lab 10/26/17 0323 10/27/17 0500 10/28/17 0410 10/28/17 0500 10/29/17 0315 10/30/17 0845  WBC 23.9* 19.5* 18.3*  --  20.6* 24.1*  CREATININE 0.86 0.85  --  0.87 0.88 0.86  LATICACIDVEN 1.0  --   --   --   --   --     Estimated Creatinine Clearance: 54.4 mL/min (by C-G formula based on SCr of 0.86 mg/dL).    Allergies  Allergen Reactions  . Zithromax [Azithromycin] Swelling and Other (See Comments)    Mouth swelling and blisters   . Aspirin Other (See Comments)    Stomach cramping   . Ibuprofen Other (See Comments)    Pt states that she prefers not to take this medication.   . Iron Nausea And Vomiting and Other (See Comments)    FERROUS SULFATE  . Sulfa Antibiotics Nausea And Vomiting    Antimicrobials this admission: 11/8 Vancomycin  >> 11/11; 11/15 >> 11/16 (surg ppx) 11/8 Zosyn  >> 11/21 11/9 Anidulafungin >> 11/21 Levaquin>> 11/21 11/21 Meropenem >>  Dose adjustments this admission: n/a  Microbiology results: 11/8 BCx: neg 11/8 MRSA PCR: neg 11/10 CDiff: neg/neg 11/10 GI Panel PCR: neg 11/12 left pleural fluid: ngf 11/13 bld x2: neg 11/14 UC: neg 11/15 Mediastinal fluid x 2 - negative 11/15 AFB smear - negative 11/12 L pleural fluid  for fungus - no fungus isolated after 7 days, pending 11/15 fungus - to [facility] - pending 11/18 blood - for fungus - ng x 1 day so far 11/18 blood x 3 - ng x 1 day so far 11/21 UCx - no growth 11/21 BCx - NGTD 11/23 Abscess - rare GPC in pairs, rare yeast 11/23 CDiff - negative  Thank you for allowing pharmacy to be a part of this patient's care.  Arrie Senate, PharmD, BCPS PGY-2 Cardiology Pharmacy Resident Pager: [phone] 10/30/2017

## 2017-10-30 NOTE — Progress Notes (Signed)
Referring Physician(s): Dr Tyrell Antonio  Supervising Physician: Corrie Mckusick  Patient Status:  [facility] - In-pt  Chief Complaint:  11/23: CT guided placement of 10 Fr drain in anterior abdominal abscess.    Subjective:  Draining well OP brown color Pt feels ok Complains of left neck pain  Allergies: Zithromax [azithromycin]; Aspirin; Ibuprofen; Iron; and Sulfa antibiotics  Medications: Prior to Admission medications   Medication Sig Start Date End Date Taking? Authorizing Provider  albuterol (PROVENTIL HFA;VENTOLIN HFA) 108 (90 Base) MCG/ACT inhaler Inhale 2 puffs into the lungs every 6 (six) hours as needed for wheezing. 12/09/15  Yes Rama, Venetia Maxon, MD  apixaban (ELIQUIS) 5 MG TABS tablet Take 10 mg twice a day for 7 days and then start using 5 mg BID. 10/07/17  Yes Barton Dubois, MD  cyanocobalamin (,VITAMIN B-12,) 1000 MCG/ML injection INJECT 1000 MCG IM ONCE MONTHLY 07/09/17  Yes Rehman, Mechele Dawley, MD  dicyclomine (BENTYL) 10 MG/5ML syrup TAKE 10 MLS (20 MG TOTAL) BY MOUTH 3 (THREE) TIMES DAILY AS NEEDED. Patient taking differently: Take 20 mg 3 (three) times daily as needed by mouth (stomach cramps).  07/26/17  Yes Rehman, Mechele Dawley, MD  diphenoxylate-atropine (LOMOTIL) 2.5-0.025 MG tablet TAKE 1 TABLET BY MOUTH FOUR TIMES A DAY AS NEEDED Patient taking differently: Take one tablet twice daily. 06/28/17  Yes Setzer, Terri L, NP  feeding supplement (BOOST / RESOURCE BREEZE) LIQD Take 1 Container by mouth 3 (three) times daily between meals. 10/07/17  Yes Barton Dubois, MD  furosemide (LASIX) 20 MG tablet Take 1 tablet (20 mg total) by mouth daily as needed (Take for weight gain of more than 2 pounds in a day or 5 pounds in 2 days.). 06/16/17  Yes Lavina Hamman, MD  HYDROcodone-acetaminophen (NORCO) 7.5-325 MG tablet Take 1 tablet by mouth every 6 (six) hours as needed for moderate pain or severe pain. 09/26/17  Yes Michael Boston, MD  magnesium oxide (MAG-OX) 400 (241.3 Mg) MG tablet  Take 1 tablet (400 mg total) by mouth daily. 10/07/17  Yes Barton Dubois, MD  nystatin (NYSTATIN) powder Apply 1 g topically 2 (two) times daily as needed (for itching/irritation).   Yes [provider]  pantoprazole (PROTONIX) 40 MG tablet Take 1 tablet (40 mg total) by mouth daily. 06/16/17  Yes Nita Sells, MD  prochlorperazine (COMPAZINE) 10 MG tablet Take 1 tablet (10 mg total) by mouth every 6 (six) hours as needed for refractory nausea / vomiting. 10/07/17  Yes Barton Dubois, MD  sodium bicarbonate 650 MG tablet Take 650 mg by mouth 2 (two) times daily.   Yes [provider]  spironolactone (ALDACTONE) 50 MG tablet Take 50 mg by mouth daily.  06/23/17  Yes [provider]  acetaminophen (TYLENOL) 325 MG tablet Take 2 tablets (650 mg total) by mouth every 6 (six) hours as needed for mild pain or moderate pain. Patient not taking: Reported on 10/27/2017 10/07/17   Barton Dubois, MD  promethazine (PHENERGAN) 25 MG tablet TAKE 1 TABLET (25 MG TOTAL) BY MOUTH 2 (TWO) TIMES DAILY AS NEEDED FOR NAUSEA OR VOMITING. Patient not taking: Reported on 10/09/2017 09/29/17   Rogene Houston, MD     Vital Signs: BP (!) 158/88 (BP Location: Right Leg)   Pulse (!) 109   Temp 99.9 F (37.7 C) (Rectal)   Resp (!) 28   Ht 5' 4.5" (1.638 m)   Wt 123 lb 0.3 oz (55.8 kg)   SpO2 100%   BMI 20.79  kg/m   Physical Exam  Abdominal: Soft.  Musculoskeletal: Normal range of motion.  Skin: Skin is warm and dry.  Mid abd drain is intact Draining brownish fluid 20 cc in JP Site is clean and dry NT No bleeding    Nursing note and vitals reviewed.   Imaging: Ct Abdomen Pelvis W Contrast  Result Date: 10/28/2017 CLINICAL DATA:  Abdominal pain, nausea, and fever.  Crohn's disease. EXAM: CT ABDOMEN AND PELVIS WITH CONTRAST TECHNIQUE: Multidetector CT imaging of the abdomen and pelvis was performed using the standard protocol following bolus administration of intravenous  contrast. CONTRAST:  120mL ISOVUE-300 IOPAMIDOL (ISOVUE-300) INJECTION 61% COMPARISON:  10/13/2017 FINDINGS: Lower Chest: Increased size of moderate right pleural effusion. Small left pleural effusion is decreased in size. Left chest tube is now seen, with distal tip seen in the inferior middle mediastinum adjacent to hiatal hernia. Previously seen inferior mediastinal fluid collection surrounding the distal esophagus and small hiatal hernia is nearly completely resolved since previous study. Persistent inflammatory changes seen in the inferior middle mediastinum adjacent to the hiatal hernia. Hepatobiliary: No hepatic masses identified. Prior cholecystectomy. No evidence of biliary obstruction. Pancreas:  No mass or inflammatory changes. Spleen: No evidence of splenomegaly. Small complex fluid collection seen along the posterosuperior margin the spleen measures 3.6 x 1.4 cm on image 20/3, without significant change compared to previous study. Adrenals/Urinary Tract: No masses identified. No evidence of hydronephrosis. Foley catheter seen within urinary bladder which is decompressed. Stomach/Bowel: Rectal tube seen in place. Postop changes from previous distal small bowel and right colon resection seen. Mild increase in moderate wall thickening is seen involving the distal ileum extending to the ileocolic anastomosis, with mild adjacent mesenteric inflammatory changes. Mild dilatation of small bowel loops proximal to this site noted within the pelvis. This is consistent with Crohn's disease with stricture and partial distal small bowel obstruction. No evidence of abscess. 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 Vascular/Lymphatic: No pathologically enlarged lymph nodes. No abdominal aortic aneurysm. Reproductive: Persistent rim enhancing fluid collection in pelvic cul-de-sac measuring 4.5 x 2.7 cm, without significant change compared to prior exam. This is suspicious for abscess. Other:   None. Musculoskeletal:  No suspicious bone lesions identified. IMPRESSION: Near complete resolution of inferior mediastinal fluid collection/abscess surrounding the distal esophagus and small hiatal hernia and decreased small left pleural effusion, following chest tube placement. Increased moderate right pleural effusion and right basilar atelectasis. Mild increase in wall thickening and adjacent inflammatory changes involving the distal ileum, with mild dilatation of several small bowel loops proximal to this site. This is consistent with Crohn's disease and stricture formation. Stable small rim enhancing fluid collections along posterosuperior margin of the spleen and pelvic cul-de-sac, suspicious for abscesses. Left-sided transsphincteric perianal fistula. CONTRAST EXTRAVASATION CONSULTATION: Type of contrast:  Isovue 300 Site of extravasation: Upper right forearm Estimated volume of extravasation: 50 ml Area of extravasation scanned with CT? no PATIENT'S SIGNS AND SYMPTOMS Skin blistering/ulceration: no Decrease capillary refill: no Change in skin color: no Decreased motor function or severe tightness: no Decreased pulses distal to site of extravasation: no Altered sensation: no Increasing pain or signs of increased swelling during observation: no TREATMENT Observation period at site: 20 minutes Limb elevation: yes Ice packs applied: yes Heat pads applied: No Plastic surgery consulted? no DOCUMENTATION AND FOLLOW-UP Site contrast extravasation forms submitted? yes Post extravasation orders completed? yes Was additional follow up assigned to PA's? Yes Patient's questions answered? yes Patient instructed to call [phone] or seek  immediate medical care if symptoms progress. Electronically Signed   By: Earle Gell M.D.   On: 10/28/2017 12:56   US Renal  Result Date: 10/28/2017 CLINICAL DATA:  Urinary retention EXAM: LIMITED RENAL ULTRASOUND COMPARISON:  None. FINDINGS: Right Kidney: Length: 11.1 cm.  Echogenicity and renal cortical thickness are within normal limits. No mass, perinephric fluid, or hydronephrosis visualized. No sonographically demonstrable calculus or ureterectasis. Left Kidney: Patient refused to allow imaging of left flank. Bladder: Decompressed with Foley catheter. IMPRESSION: Limited study as patient refused to allow imaging of the left flank. Right kidney appears unremarkable. Urinary bladder decompressed with Foley catheter and cannot be assessed. Electronically Signed   By: Lowella Grip III M.D.   On: 10/28/2017 13:32   Dg Chest Port 1 View  Result Date: 10/28/2017 CLINICAL DATA:  Mediastinal abscess EXAM: PORTABLE CHEST 1 VIEW COMPARISON:  10/27/2017 FINDINGS: Mediastinal drain on the left unchanged from the prior study. Bibasilar atelectasis unchanged. Small bilateral effusions unchanged. No pneumothorax Central line tips unchanged. One line in the SVC other in the azygos vein. IMPRESSION: Mediastinal drain unchanged in position. Bibasilar atelectasis/ infiltrate and small effusions unchanged. Electronically Signed   By: Franchot Gallo M.D.   On: 10/28/2017 08:54   Dg Chest Port 1 View  Result Date: 10/27/2017 CLINICAL DATA:  Fever EXAM: PORTABLE CHEST 1 VIEW COMPARISON:  10/24/2017 FINDINGS: Left dialysis catheter and PICC line remain in place, unchanged. PICC line courses superiorly, unchanged. Interval removal of left chest tube. No pneumothorax. Mild cardiomegaly. Bilateral perihilar and lower lobe opacities, similar to prior study. IMPRESSION: Interval removal of left chest tube without pneumothorax. Otherwise no significant change. Electronically Signed   By: Rolm Baptise M.D.   On: 10/27/2017 09:45   Ct Image Guided Drainage By Percutaneous Catheter  Result Date: 10/29/2017 INDICATION: 71 year old with a persistent anterior abdominal fluid collection near the falciform ligament of the liver. This fluid collection has been present for quite a while but the  patient continues to have fevers. Complex medical history including Crohn's disease and abdominal abscess collections. EXAM: CT GUIDED DRAINAGE OF ANTERIOR ABDOMINAL ABSCESS MEDICATIONS: None ANESTHESIA/SEDATION: 4 mg IV Versed 20 mcg IV Fentanyl Moderate Sedation Time:  23 minutes The patient was continuously monitored during the procedure by the interventional radiology nurse under my direct supervision. COMPLICATIONS: None immediate. TECHNIQUE: Informed written consent was obtained from the patient after a thorough discussion of the procedural risks, benefits and alternatives. All questions were addressed. Maximal Sterile Barrier Technique was utilized including caps, mask, sterile gowns, sterile gloves, sterile drape, hand hygiene and skin antiseptic. A timeout was performed prior to the initiation of the procedure. PROCEDURE: Patient was placed supine in the CT scanner. CT images through the anterior abdomen were obtained. Anterior abdomen was prepped with chlorhexidine. Sterile field was created. Skin was anesthetized with 1% lidocaine. Using CT guidance, 18 gauge trocar needle was directed into the small fluid collection along the inferior left hepatic lobe. Thick brown fluid was aspirated. A stiff Amplatz wire was advanced into the collection. The tract was dilated to accommodate a 10.2 [place] multipurpose drain. Catheter was attached to a suction bulb. Catheter was sutured to skin. FINDINGS: Small fluid collection along the inferior aspect of the left hepatic lobe. Thick brown fluid was aspirated from the collection. 10 French drain was placed. IMPRESSION: CT-guided placement of a drainage catheter within the small fluid collection along the inferior aspect of the left hepatic lobe. Fluid was sent for culture. Electronically Signed  By: Markus Daft M.D.   On: 10/29/2017 17:36    Labs:  CBC: Recent Labs    10/27/17 0500 10/28/17 0410 10/29/17 0315 10/30/17 0845  WBC 19.5* 18.3* 20.6* 24.1*  HGB  8.9* 8.6* 8.9* 10.1*  HCT 28.1* 27.4* 28.0* 31.2*  PLT 266 280 286 361    COAGS: Recent Labs    05/07/17 0244 09/17/17 1021 10/13/2017 1405 10/15/17 0400 10/29/17 1115  INR  --  1.24 1.69 1.52 1.24  APTT 21*  --   --   --   --     BMP: Recent Labs    10/27/17 0500 10/28/17 0500 10/29/17 0315 10/30/17 0845  NA 134* 135 135 132*  K 4.5 4.5 4.6 5.5*  CL 106 107 106 105  CO2 23 22 22  19*  GLUCOSE 116* 110* 109* 83  BUN 18 18 18  21*  CALCIUM 7.9* 7.8* 8.2* 8.4*  CREATININE 0.85 0.87 0.88 0.86  GFRNONAA >60 >60 >60 >60  GFRAA >60 >60 >60 >60    LIVER FUNCTION TESTS: Recent Labs    10/27/2017 0321 10/23/17 0513 10/25/17 0005 10/28/17 0500  BILITOT 0.8 0.4 0.3 0.3  AST 60* 28 32 21  ALT 37 27 28 16   ALKPHOS 112 111 145* 120  PROT 6.0* 6.9 6.0* 6.0*  ALBUMIN 1.5* 1.6* 1.5* 1.3*    Assessment and Plan:  Mid abd abscess drain is in place Will follow  Electronically Signed: Jerusalen Mateja A, PA-C 10/30/2017, 1:23 PM   I spent a total of 15 Minutes at the the patient's bedside AND on the patient's hospital floor or unit, greater than 50% of which was counseling/coordinating care for abd abscess drain

## 2017-10-30 NOTE — Progress Notes (Signed)
9 Days Post-Op   Subjective/Chief Complaint: Pt with no acute events overnight Con't to be febrile Tol water Refusing NG feeding tube   Objective: Vital signs in last 24 hours: Temp:  [98.4 F (36.9 C)-103.1 F (39.5 C)] 99.5 F (37.5 C) (11/24 0406) Pulse Rate:  [92-117] 92 (11/24 0406) Resp:  [18-43] 28 (11/24 0449) BP: (90-170)/(51-86) 109/56 (11/24 0406) SpO2:  [95 %-100 %] 99 % (11/24 0449) Last BM Date: 10/29/17  Intake/Output from previous day: 11/23 0701 - 11/24 0700 In: 1241.1 [I.V.:711.1; IV Piggyback:530] Out: 6606 [Urine:4200; Drains:10; Stool:600; Chest Tube:80] Intake/Output this shift: No intake/output data recorded.  General appearance: alert and cooperative GI: soft, non-tender; bowel sounds normal; no masses,  no organomegaly  Lab Results:  Recent Labs    10/28/17 0410 10/29/17 0315  WBC 18.3* 20.6*  HGB 8.6* 8.9*  HCT 27.4* 28.0*  PLT 280 286   BMET Recent Labs    10/28/17 0500 10/29/17 0315  NA 135 135  K 4.5 4.6  CL 107 106  CO2 22 22  GLUCOSE 110* 109*  BUN 18 18  CREATININE 0.87 0.88  CALCIUM 7.8* 8.2*   PT/INR Recent Labs    10/29/17 1115  LABPROT 15.5*  INR 1.24   Studies/Results: Ct Abdomen Pelvis W Contrast  Result Date: 10/28/2017 CLINICAL DATA:  Abdominal pain, nausea, and fever.  Crohn's disease. EXAM: CT ABDOMEN AND PELVIS WITH CONTRAST TECHNIQUE: Multidetector CT imaging of the abdomen and pelvis was performed using the standard protocol following bolus administration of intravenous contrast. CONTRAST:  159mL ISOVUE-300 IOPAMIDOL (ISOVUE-300) INJECTION 61% COMPARISON:  10/15/2017 FINDINGS: Lower Chest: Increased size of moderate right pleural effusion. Small left pleural effusion is decreased in size. Left chest tube is now seen, with distal tip seen in the inferior middle mediastinum adjacent to hiatal hernia. Previously seen inferior mediastinal fluid collection surrounding the distal esophagus and small hiatal hernia  is nearly completely resolved since previous study. Persistent inflammatory changes seen in the inferior middle mediastinum adjacent to the hiatal hernia. Hepatobiliary: No hepatic masses identified. Prior cholecystectomy. No evidence of biliary obstruction. Pancreas:  No mass or inflammatory changes. Spleen: No evidence of splenomegaly. Small complex fluid collection seen along the posterosuperior margin the spleen measures 3.6 x 1.4 cm on image 20/3, without significant change compared to previous study. Adrenals/Urinary Tract: No masses identified. No evidence of hydronephrosis. Foley catheter seen within urinary bladder which is decompressed. Stomach/Bowel: Rectal tube seen in place. Postop changes from previous distal small bowel and right colon resection seen. Mild increase in moderate wall thickening is seen involving the distal ileum extending to the ileocolic anastomosis, with mild adjacent mesenteric inflammatory changes. Mild dilatation of small bowel loops proximal to this site noted within the pelvis. This is consistent with Crohn's disease with stricture and partial distal small bowel obstruction. No evidence of abscess. 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 Vascular/Lymphatic: No pathologically enlarged lymph nodes. No abdominal aortic aneurysm. Reproductive: Persistent rim enhancing fluid collection in pelvic cul-de-sac measuring 4.5 x 2.7 cm, without significant change compared to prior exam. This is suspicious for abscess. Other:  None. Musculoskeletal:  No suspicious bone lesions identified. IMPRESSION: Near complete resolution of inferior mediastinal fluid collection/abscess surrounding the distal esophagus and small hiatal hernia and decreased small left pleural effusion, following chest tube placement. Increased moderate right pleural effusion and right basilar atelectasis. Mild increase in wall thickening and adjacent inflammatory changes involving the distal  ileum, with mild dilatation of several small bowel  loops proximal to this site. This is consistent with Crohn's disease and stricture formation. Stable small rim enhancing fluid collections along posterosuperior margin of the spleen and pelvic cul-de-sac, suspicious for abscesses. Left-sided transsphincteric perianal fistula. CONTRAST EXTRAVASATION CONSULTATION: Type of contrast:  Isovue 300 Site of extravasation: Upper right forearm Estimated volume of extravasation: 50 ml Area of extravasation scanned with CT? no PATIENT'S SIGNS AND SYMPTOMS Skin blistering/ulceration: no Decrease capillary refill: no Change in skin color: no Decreased motor function or severe tightness: no Decreased pulses distal to site of extravasation: no Altered sensation: no Increasing pain or signs of increased swelling during observation: no TREATMENT Observation period at site: 20 minutes Limb elevation: yes Ice packs applied: yes Heat pads applied: No Plastic surgery consulted? no DOCUMENTATION AND FOLLOW-UP Site contrast extravasation forms submitted? yes Post extravasation orders completed? yes Was additional follow up assigned to PA's? Yes Patient's questions answered? yes Patient instructed to call [phone] or seek immediate medical care if symptoms progress. Electronically Signed   By: Earle Gell M.D.   On: 10/28/2017 12:56   US Renal  Result Date: 10/28/2017 CLINICAL DATA:  Urinary retention EXAM: LIMITED RENAL ULTRASOUND COMPARISON:  None. FINDINGS: Right Kidney: Length: 11.1 cm. Echogenicity and renal cortical thickness are within normal limits. No mass, perinephric fluid, or hydronephrosis visualized. No sonographically demonstrable calculus or ureterectasis. Left Kidney: Patient refused to allow imaging of left flank. Bladder: Decompressed with Foley catheter. IMPRESSION: Limited study as patient refused to allow imaging of the left flank. Right kidney appears unremarkable. Urinary bladder decompressed with Foley  catheter and cannot be assessed. Electronically Signed   By: Lowella Grip III M.D.   On: 10/28/2017 13:32   Ct Image Guided Drainage By Percutaneous Catheter  Result Date: 10/29/2017 INDICATION: 71 year old with a persistent anterior abdominal fluid collection near the falciform ligament of the liver. This fluid collection has been present for quite a while but the patient continues to have fevers. Complex medical history including Crohn's disease and abdominal abscess collections. EXAM: CT GUIDED DRAINAGE OF ANTERIOR ABDOMINAL ABSCESS MEDICATIONS: None ANESTHESIA/SEDATION: 4 mg IV Versed 20 mcg IV Fentanyl Moderate Sedation Time:  23 minutes The patient was continuously monitored during the procedure by the interventional radiology nurse under my direct supervision. COMPLICATIONS: None immediate. TECHNIQUE: Informed written consent was obtained from the patient after a thorough discussion of the procedural risks, benefits and alternatives. All questions were addressed. Maximal Sterile Barrier Technique was utilized including caps, mask, sterile gowns, sterile gloves, sterile drape, hand hygiene and skin antiseptic. A timeout was performed prior to the initiation of the procedure. PROCEDURE: Patient was placed supine in the CT scanner. CT images through the anterior abdomen were obtained. Anterior abdomen was prepped with chlorhexidine. Sterile field was created. Skin was anesthetized with 1% lidocaine. Using CT guidance, 18 gauge trocar needle was directed into the small fluid collection along the inferior left hepatic lobe. Thick brown fluid was aspirated. A stiff Amplatz wire was advanced into the collection. The tract was dilated to accommodate a 10.2 [place] multipurpose drain. Catheter was attached to a suction bulb. Catheter was sutured to skin. FINDINGS: Small fluid collection along the inferior aspect of the left hepatic lobe. Thick brown fluid was aspirated from the collection. 10 French drain was  placed. IMPRESSION: CT-guided placement of a drainage catheter within the small fluid collection along the inferior aspect of the left hepatic lobe. Fluid was sent for culture. Electronically Signed   By: Markus Daft M.D.   On:  10/29/2017 17:36    Anti-infectives: Anti-infectives (From admission, onward)   Start     Dose/Rate Route Frequency Ordered Stop   10/27/17 1400  meropenem (MERREM) 1 g in sodium chloride 0.9 % 100 mL IVPB     1 g 200 mL/hr over 30 Minutes Intravenous Every 8 hours 10/27/17 1245     10/27/17 1100  levofloxacin (LEVAQUIN) IVPB 750 mg  Status:  Discontinued     750 mg 100 mL/hr over 90 Minutes Intravenous Every 24 hours 10/27/17 1041 10/27/17 1218   10/27/17 0400  piperacillin-tazobactam (ZOSYN) IVPB 3.375 g  Status:  Discontinued     3.375 g 12.5 mL/hr over 240 Minutes Intravenous Every 8 hours 10/26/17 2201 10/27/17 1222   10/22/17 0600  vancomycin (VANCOCIN) IVPB 1000 mg/200 mL premix  Status:  Discontinued     1,000 mg 200 mL/hr over 60 Minutes Intravenous To Short Stay 11/01/2017 1631 11/02/2017 1637   10/16/2017 2100  vancomycin (VANCOCIN) IVPB 1000 mg/200 mL premix     1,000 mg 200 mL/hr over 60 Minutes Intravenous Every 12 hours 10/26/2017 1631 10/16/2017 2246   10/27/2017 0900  vancomycin (VANCOCIN) IVPB 1000 mg/200 mL premix     1,000 mg 200 mL/hr over 60 Minutes Intravenous To Surgery 10/10/2017 0857 10/26/2017 0957   10/17/17 0800  vancomycin (VANCOCIN) IVPB 750 mg/150 ml premix  Status:  Discontinued     750 mg 150 mL/hr over 60 Minutes Intravenous Every 24 hours 10/16/17 1220 10/17/17 1023   10/16/17 1800  anidulafungin (ERAXIS) 100 mg in sodium chloride 0.9 % 100 mL IVPB     100 mg 78 mL/hr over 100 Minutes Intravenous Every 24 hours 10/15/17 1657     10/16/17 0600  vancomycin (VANCOCIN) IVPB 1000 mg/200 mL premix  Status:  Discontinued     1,000 mg 200 mL/hr over 60 Minutes Intravenous Every 36 hours 10/24/2017 2308 10/16/17 1220   10/15/17 1800  anidulafungin  (ERAXIS) 200 mg in sodium chloride 0.9 % 200 mL IVPB     200 mg 78 mL/hr over 200 Minutes Intravenous  Once 10/15/17 1657 10/15/17 2148   10/20/2017 2330  piperacillin-tazobactam (ZOSYN) IVPB 3.375 g  Status:  Discontinued     3.375 g 100 mL/hr over 30 Minutes Intravenous Every 8 hours 11/02/2017 2257 10/08/2017 2259   10/12/2017 2315  piperacillin-tazobactam (ZOSYN) IVPB 3.375 g  Status:  Discontinued     3.375 g 12.5 mL/hr over 240 Minutes Intravenous Every 8 hours 10/17/2017 2300 10/26/17 2201   10/11/2017 1930  vancomycin (VANCOCIN) IVPB 1000 mg/200 mL premix     1,000 mg 200 mL/hr over 60 Minutes Intravenous  Once 10/26/2017 1915 10/30/2017 2044   10/28/2017 1530  piperacillin-tazobactam (ZOSYN) IVPB 3.375 g     3.375 g 100 mL/hr over 30 Minutes Intravenous  Once 11/02/2017 1529 10/08/2017 1604      Assessment/Plan: s/p Procedure(s): LEFT VIDEO ASSISTED THORACOSCOPY (VATS) for MEDISTINAL DRAINAGE (Left) She is tol some clear liquids.  Pt refusing NG feeding tube at this time.  Will try her on Full Liq diet and see if she can tolerate that. Con't TNA Abx per ID Cdiff neg      LOS: 16 days    [name]., [facility] 10/30/2017

## 2017-10-30 NOTE — Progress Notes (Signed)
PROGRESS NOTE    Danielle [name]  WKM:[id number] DOB: 11/16/1946 DOA: 10/07/2017 PCP: Iona Beard, MD    Brief Narrative: 71 y.o.F w/ a Hxof Crohn's disease, hiatal hernia,psoriasis, and SBO who underwent a type IV hiatal hernia repair in October 2018.Postoperatively the patient required endotracheal intubation, mechanical ventilation and the use of pressors. She was extubated on postop day #2. She improved clinically and was on parenteral nutrition, however she developed a pelvic fluid collection which required a drain. On 09/11/2017 she had blood cultures positive for Candida glabrata, and the pelvic abscess culture was positive for C. Albicans and C. Glabrata. She was given treatment withparenteral antifungals. She subsequently improved, was able to ambulate with physical therapy, weaned off of parenteral nutrition and was tolerating oral diet. ID recommended to continue Invanz andErasisfor 2 weeks. A PICC line was placed in her right arm and she was ultimately discharged home.  She returned on 10/01/2017 due to intermittent fevers. She was treated empirically and clinically improved to the point that she was able to be d/c home again on 11/1.  She returned to the ED 11/8 with complaints of persistent nausea associated with emesis. CT abdomen pelvis showed re accumulation of fluid collection in the lower mediastinum, bibasilar pleural effusion, another small complex collection along the posterior margin of the spleen. Patient underwent left video assisted thoracoscopy for mediastinal drainage of mediastinal abscess. ID has been helping with management of IV antibiotics.   Patient continue to spike fevers, persistent nausea and vomiting. She has been on TPN, which was discontinue due to persistent fevers.   Palliative care team met with patient and her family. Patients wants to continue with aggressive care.    Assessment & Plan:   Principal Problem:   Septic shock  ([place]) Active Problems:   Leukocytosis   Perianal Crohn's disease, with fistula ([place])   Acute renal failure superimposed on stage 3 chronic kidney disease (HCC)   Hiatal hernia   Immunosuppressed status (HCC)   Chest tube in place   Hypotension   Vomiting in adult patient   S/P thoracentesis   Anasarca   DVT (deep venous thrombosis) (HCC)   Protein-calorie malnutrition, moderate (HCC)   Mediastinal abscess (HCC)   Mediastinal post-op seromas   Pleural effusion, left   Patient's noncompliance with intervention / medical advice    Dehydration   Acute embolism and thrombosis of left axillary vein (HCC)   Goals of care, counseling/discussion   Palliative care encounter   Fever   Intra-abdominal infection   Hemoptysis   Abscess   Intractable vomiting with nausea   SevereSepsis/Mediastinal abscess; Patient underwent esophagogram showing 2.5 cm area of extrinsic compression upon esophagus, underwent also thoracentesis. Case was reviewed by IR and gastroenterologist, recommendation was for CVTS consult.  -Patient underwent VAST drainage 11-15. -S/P VATS drainage on 11/15. Patient continue to spike fever, leukocytosis. .  Repeated CT scan 11-19; Ill-defined multiloculated rim enhancing fluid collection and extensive edema in posterior mediastinum inferior to mediastinal drain and anterior to aorta, probably representing the residual of drained abscess. Discrete 14 mm abscess present in the right posterior mediastinum. Multiloculated rim enhancing fluid collection surrounding the spleen and extending into left pericolic gutter, likely abscess. Small bilateral pleural effusions with left pleural drain. Stable dependent lower lobe consolidations may represent atelectasis or pneumonia. -Per CVTS mediastinal drain in good position, still has phlegmon in the area.  -CT abdomen pelvis; near complete resolution of mediastinal abscess. Stable small rim enhancing fluid collections along  posterosuperior margin of the spleen and pelvic cul-de-sac, suspicious for abscesses. -IV meropenem and Eraxis.  -underwent CT guide placement of 10 Fr drain in anterior abdominal abscess. Fluid growing yeast and Gram positive cocci in pairs. Will follow ID recommendation for antibiotics.   Right side pleural effusion;  Patient agree to proceed with thoracentesis but tomorrow.  Thoracentesis ordered.   Persistent fevers, leukocytosis;  Multifactorial, abdominal abscess, mediastinal abscess.  Will proceed with right side thoracentesis/.   Type IV hiatal hernia repair in October 2018. Post operative pelvic abscess;  CT chest 11-19. For Multiloculated rim enhancing fluid collection surrounding the spleen and extending into left pericolic gutter, likely abscess. -spiking fever, leukocytosis.  -CT abdomen and pelvis;  near complete resolution of mediastinal abscess. Stable small rim enhancing fluid collections along posterosuperior margin of the spleen and pelvic cul-de-sac, suspicious for abscesses. Plan for IR abscess aspiration, possible drain placement -underwent CT guide placement of 10 Fr drain in anterior abdominal abscess. Fluid growing yeast and Gram positive cocci in pairs. Will follow ID recommendation for antibiotics.   Known candida galbatra infection; culture 10-12, pelvis abscess; on Eraxis. ID following   History of Crohns/ chronic diarrhea  C diff 11-10 was negative.  CT abdomen with Mild increase in wall thickening and adjacent inflammatory changes involving the distal ileum, with mild dilatation of several small bowel loops proximal to this site. This is consistent with Crohn's disease and stricture formation. -C diff negative.  -No candidate for treatment for Crohn's  per GI.   Urinary Retention; continue with foley catheter; will need voiding trial.   Nutrition;  Nutrition started on TPN.  Patient with a recent fungemia with candida glabrata and albicans.  Per Dr  Fredderick Erb ok to continue with TPN for now.  Per Dr Rosendo Gros; oral trial. He doubt G tube will be stable.  Follow Sx recommendation regarding Jejunostomy tube  Anemia; no further hematemesis.  Hb slowly trending down. Monitor daily   Chronic diastolic CHF Compensated.   Recent Hematemesis/hemoptysis -stopped IV heparin  -lateral upper extremity DVT 09/2017.  -positive DVT in the left radial vein at this time -no recurrent hematemesis or hemoptysis. IV Protonix drip -CT scan of the chest shows evidence of esophageal wall thickening consistent with esophagitis -GI following.   RUE DVT; holding heparin due to hematemesis. When hb stable plan to resume heparin if clear by GI.       DVT prophylaxis: SCD.  Code Status: full code.  Family Communication: care discussed with patient.  Disposition Plan: remain in patient for IV antibiotics.    Consultants:   General surgery  CVTS  GI   Procedures: events; Significant Events: 11/8 readmit 11/11 PICC placed 11/12 thoracentesis 11/5 to [facility] at [facility] for L VATS for mediastinal drainage     Antimicrobials; antifungal  Erasix 10-16-2017  Meropenem 10-27-2017     Subjective: Still vomiting.  Having fevers.  Denies abdominal pain    Objective: Vitals:   10/30/17 0406 10/30/17 0449 10/30/17 0802 10/30/17 0820  BP: (!) 109/56     Pulse: 92  (!) 109   Resp: 19 (!) 28  (!) 22  Temp: 99.5 F (37.5 C)  (!) 101 F (38.3 C)   TempSrc: Rectal  Rectal   SpO2: 95% 99% 93% 97%  Weight:      Height:        Intake/Output Summary (Last 24 hours) at 10/30/2017 0846 Last data filed at 10/30/2017 0803 Gross per 24 hour  Intake 978.79 ml  Output 4490 ml  Net -3511.21 ml   Filed Weights   10/15/17 1449 10/20/17 1600 10/15/2017 0500  Weight: 55.9 kg (123 lb 3.8 oz) 55.3 kg (121 lb 14.6 oz) 55.8 kg (123 lb 0.3 oz)    Examination:  General exam: Tremors, NAD Respiratory system; Tachypnea, Crackles bases.  Cardiovascular  system: S 1, S 2 RRR, tachycardia Gastrointestinal system: BS present, soft, nt , abdominal wound cover.  Central nervous system: non focal.  Extremities: Symmetric 5 x 5 power.   Data Reviewed: I have personally reviewed following labs and imaging studies  CBC: Recent Labs  Lab 10/25/17 0005 10/25/17 2045 10/26/17 0323 10/27/17 0500 10/28/17 0410 10/29/17 0315  WBC 24.9* 31.8* 23.9* 19.5* 18.3* 20.6*  NEUTROABS 18.2*  --  17.0* 13.1*  --   --   HGB 9.6* 10.5* 9.2* 8.9* 8.6* 8.9*  HCT 29.5* 32.0* 28.4* 28.1* 27.4* 28.0*  MCV 84.0 83.3 84.3 85.4 93.5 84.6  PLT 274 291 253 266 280 563   Basic Metabolic Panel: Recent Labs  Lab 10/24/17 0006 10/25/17 0005 10/26/17 0323 10/27/17 0500 10/28/17 0500 10/29/17 0315  NA 135 135 135 134* 135 135  K 4.2 3.5 3.8 4.5 4.5 4.6  CL 109 109 106 106 107 106  CO2 18* 20* 21* _0 GLUCOSE 91 124* 114* 116* 110* 109*  BUN _1 CREATININE 0.88 0.84 0.86 0.85 0.87 0.88  CALCIUM 7.9* 7.7* 7.9* 7.9* 7.8* 8.2*  MG 2.0 1.9  --   --  1.9  --   PHOS  --  3.4  --   --  4.2  --    GFR: Estimated Creatinine Clearance: 53.1 mL/min (by C-G formula based on SCr of 0.88 mg/dL). Liver Function Tests: Recent Labs  Lab 10/25/17 0005 10/28/17 0500  AST 32 21  ALT 28 16  ALKPHOS 145* 120  BILITOT 0.3 0.3  PROT 6.0* 6.0*  ALBUMIN 1.5* 1.3*   No results for input(s): LIPASE, AMYLASE in the last 168 hours. No results for input(s): AMMONIA in the last 168 hours. Coagulation Profile: Recent Labs  Lab 10/29/17 1115  INR 1.24   Cardiac Enzymes: No results for input(s): CKTOTAL, CKMB, CKMBINDEX, TROPONINI in the last 168 hours. BNP (last 3 results) No results for input(s): PROBNP in the last 8760 hours. HbA1C: No results for input(s): HGBA1C in the last 72 hours. CBG: Recent Labs  Lab 10/28/17 0350 10/28/17 1451 10/29/17 0108 10/29/17 0645 10/29/17 2019  GLUCAP 109* 69 115* 96 103*   Lipid Profile: No results for  input(s): CHOL, HDL, LDLCALC, TRIG, CHOLHDL, LDLDIRECT in the last 72 hours. Thyroid Function Tests: No results for input(s): TSH, T4TOTAL, FREET4, T3FREE, THYROIDAB in the last 72 hours. Anemia Panel: No results for input(s): VITAMINB12, FOLATE, FERRITIN, TIBC, IRON, RETICCTPCT in the last 72 hours. Sepsis Labs: Recent Labs  Lab 10/26/17 0323  LATICACIDVEN 1.0    Recent Results (from the past 240 hour(s))  Culture, Urine     Status: None   Collection Time: 10/20/17  5:06 PM  Result Value Ref Range Status   Specimen Description URINE, CATHETERIZED  Final   Special Requests NONE  Final   Culture   Final    NO GROWTH Performed at [facility], [address], [place], [place] [address]    Report Status 10/22/2017 FINAL  Final  Surgical PCR screen     Status: None   Collection Time: 10/17/2017  3:21 AM  Result Value Ref Range Status   MRSA, PCR NEGATIVE NEGATIVE Final   Staphylococcus aureus NEGATIVE NEGATIVE Final    Comment: (NOTE) The Xpert SA Assay (FDA approved for NASAL specimens in patients 49 years of age and older), is one component of a comprehensive surveillance program. It is not intended to diagnose infection nor to guide or monitor treatment.   Body fluid culture     Status: None   Collection Time: 10/22/2017  9:02 AM  Result Value Ref Range Status   Specimen Description FLUID  Final   Special Requests MEDISTINAL PATIENT ON FOLLOWING ZOSYN AND VANC  Final   Gram Stain   Final    MODERATE WBC PRESENT,BOTH PMN AND MONONUCLEAR NO ORGANISMS SEEN    Culture NO GROWTH 3 DAYS  Final   Report Status 10/24/2017 FINAL  Final  Body fluid culture     Status: None   Collection Time: 10/25/2017  9:02 AM  Result Value Ref Range Status   Specimen Description FLUID PLEURAL LEFT  Final   Special Requests PATIENT ON FOLLOWING ZOSYN AND VANC  Final   Gram Stain   Final    MODERATE WBC PRESENT, PREDOMINANTLY MONONUCLEAR NO ORGANISMS SEEN    Culture NO GROWTH 3 DAYS  Final    Report Status 10/24/2017 FINAL  Final  Fungus Culture With Stain     Status: None (Preliminary result)   Collection Time: 10/29/2017  9:02 AM  Result Value Ref Range Status   Fungus Stain Final report  Final    Comment: (NOTE) Performed At: [facility] [place], [place] [id number] Rush Farmer MD RP:[id number]    Fungus (Mycology) Culture PENDING  Incomplete   Fungal Source FLUID  Final    Comment: PLEURAL LEFT PATIENT ON FOLLOWING ZOSYN AND VANC   Acid Fast Smear (AFB)     Status: None   Collection Time: 10/17/2017  9:02 AM  Result Value Ref Range Status   AFB Specimen Processing Concentration  Final   Acid Fast Smear Negative  Final    Comment: (NOTE) Performed At: [facility] [place], [place] [id number] Rush Farmer MD RR:[id number]    Source (AFB) FLUID  Final    Comment: PLEURAL LEFT PATIENT ON FOLLOWING ZOSYN AND VANC   Fungus Culture Result     Status: None   Collection Time: 10/08/2017  9:02 AM  Result Value Ref Range Status   Result 1 Comment  Final    Comment: (NOTE) KOH/Calcofluor preparation:  no fungus observed. Performed At: [facility] [place], [place] [id number] Rush Farmer MD YO:[id number]   Fungus Culture With Stain     Status: None (Preliminary result)   Collection Time: 10/19/2017  9:42 AM  Result Value Ref Range Status   Fungus Stain Final report  Final    Comment: (NOTE) Performed At: [facility] [place], [place] [id number] Rush Farmer MD YE:[id number]    Fungus (Mycology) Culture PENDING  Incomplete   Fungal Source FLUID  Final    Comment: MEDISTINAL PATIENT ON FOLLOWING ZOSYN AND VANC    Acid Fast Smear (AFB)     Status: None   Collection Time: 10/20/2017  9:42 AM  Result Value Ref Range Status   AFB Specimen Processing Concentration  Final   Acid Fast Smear Negative  Final    Comment: (NOTE) Performed At: [facility] [address] [place], [place] [id number] Rush Farmer MD DB:[id number]    Source (  AFB) FLUID  Final    Comment: MEDISTINAL PATIENT ON FOLLOWING ZOSYN AND VANC   Fungus Culture Result     Status: None   Collection Time: 10/16/2017  9:42 AM  Result Value Ref Range Status   Result 1 Comment  Final    Comment: (NOTE) KOH/Calcofluor preparation:  no fungus observed. Performed At: [facility] [place], [place] [id number] Rush Farmer MD [id number]   Culture, blood (routine x 2)     Status: None   Collection Time: 10/24/17 12:06 AM  Result Value Ref Range Status   Specimen Description BLOOD LEFT FOOT  Final   Special Requests   Final    BOTTLES DRAWN AEROBIC AND ANAEROBIC Blood Culture adequate volume   Culture NO GROWTH 5 DAYS  Final   Report Status 10/29/2017 FINAL  Final  Culture, blood (routine x 2)     Status: None   Collection Time: 10/24/17 12:16 AM  Result Value Ref Range Status   Specimen Description BLOOD RIGHT FOOT  Final   Special Requests   Final    BOTTLES DRAWN AEROBIC ONLY Blood Culture results may not be optimal due to an inadequate volume of blood received in culture bottles   Culture NO GROWTH 5 DAYS  Final   Report Status 10/29/2017 FINAL  Final  Fungus culture, blood     Status: None (Preliminary result)   Collection Time: 10/24/17 12:16 AM  Result Value Ref Range Status   Specimen Description BLOOD LEFT FOOT  Final   Special Requests   Final    BOTTLES DRAWN AEROBIC AND ANAEROBIC Blood Culture adequate volume   Culture NO GROWTH 5 DAYS  Final   Report Status PENDING  Incomplete  Culture, blood (routine x 2)     Status: None   Collection Time: 10/24/17  3:50 PM  Result Value Ref Range Status   Specimen Description BLOOD RIGHT FOOT  Final   Special Requests IN PEDIATRIC BOTTLE Blood Culture adequate volume  Final   Culture NO GROWTH 5 DAYS  Final   Report Status 10/29/2017 FINAL  Final  Culture, blood (routine x 2)     Status: None  (Preliminary result)   Collection Time: 10/27/17  1:19 PM  Result Value Ref Range Status   Specimen Description BLOOD RIGHT HAND  Final   Special Requests IN PEDIATRIC BOTTLE Blood Culture adequate volume  Final   Culture NO GROWTH 2 DAYS  Final   Report Status PENDING  Incomplete  Culture, blood (routine x 2)     Status: None (Preliminary result)   Collection Time: 10/27/17  1:24 PM  Result Value Ref Range Status   Specimen Description BLOOD RIGHT HAND  Final   Special Requests IN PEDIATRIC BOTTLE Blood Culture adequate volume  Final   Culture NO GROWTH 2 DAYS  Final   Report Status PENDING  Incomplete  Culture, Urine     Status: None   Collection Time: 10/27/17  4:44 PM  Result Value Ref Range Status   Specimen Description URINE, RANDOM  Final   Special Requests NONE  Final   Culture NO GROWTH  Final   Report Status 10/28/2017 FINAL  Final  Aerobic/Anaerobic Culture (surgical/deep wound)     Status: None (Preliminary result)   Collection Time: 10/29/17  4:17 PM  Result Value Ref Range Status   Specimen Description ABSCESS  Final   Special Requests ABDOMEN  Final   Gram Stain   Final    ABUNDANT WBC  PRESENT, PREDOMINANTLY PMN RARE GRAM POSITIVE COCCI IN PAIRS RARE BUDDING YEAST SEEN    Culture PENDING  Incomplete   Report Status PENDING  Incomplete  C difficile quick scan w PCR reflex     Status: None   Collection Time: 10/29/17  4:25 PM  Result Value Ref Range Status   C Diff antigen NEGATIVE NEGATIVE Final   C Diff toxin NEGATIVE NEGATIVE Final   C Diff interpretation No C. difficile detected.  Final         Radiology Studies: Ct Abdomen Pelvis W Contrast  Result Date: 10/28/2017 CLINICAL DATA:  Abdominal pain, nausea, and fever.  Crohn's disease. EXAM: CT ABDOMEN AND PELVIS WITH CONTRAST TECHNIQUE: Multidetector CT imaging of the abdomen and pelvis was performed using the standard protocol following bolus administration of intravenous contrast. CONTRAST:  167m  ISOVUE-300 IOPAMIDOL (ISOVUE-300) INJECTION 61% COMPARISON:  10/22/2017 FINDINGS: Lower Chest: Increased size of moderate right pleural effusion. Small left pleural effusion is decreased in size. Left chest tube is now seen, with distal tip seen in the inferior middle mediastinum adjacent to hiatal hernia. Previously seen inferior mediastinal fluid collection surrounding the distal esophagus and small hiatal hernia is nearly completely resolved since previous study. Persistent inflammatory changes seen in the inferior middle mediastinum adjacent to the hiatal hernia. Hepatobiliary: No hepatic masses identified. Prior cholecystectomy. No evidence of biliary obstruction. Pancreas:  No mass or inflammatory changes. Spleen: No evidence of splenomegaly. Small complex fluid collection seen along the posterosuperior margin the spleen measures 3.6 x 1.4 cm on image 20/3, without significant change compared to previous study. Adrenals/Urinary Tract: No masses identified. No evidence of hydronephrosis. Foley catheter seen within urinary bladder which is decompressed. Stomach/Bowel: Rectal tube seen in place. Postop changes from previous distal small bowel and right colon resection seen. Mild increase in moderate wall thickening is seen involving the distal ileum extending to the ileocolic anastomosis, with mild adjacent mesenteric inflammatory changes. Mild dilatation of small bowel loops proximal to this site noted within the pelvis. This is consistent with Crohn's disease with stricture and partial distal small bowel obstruction. No evidence of abscess. 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 Vascular/Lymphatic: No pathologically enlarged lymph nodes. No abdominal aortic aneurysm. Reproductive: Persistent rim enhancing fluid collection in pelvic cul-de-sac measuring 4.5 x 2.7 cm, without significant change compared to prior exam. This is suspicious for abscess. Other:  None. Musculoskeletal:  No  suspicious bone lesions identified. IMPRESSION: Near complete resolution of inferior mediastinal fluid collection/abscess surrounding the distal esophagus and small hiatal hernia and decreased small left pleural effusion, following chest tube placement. Increased moderate right pleural effusion and right basilar atelectasis. Mild increase in wall thickening and adjacent inflammatory changes involving the distal ileum, with mild dilatation of several small bowel loops proximal to this site. This is consistent with Crohn's disease and stricture formation. Stable small rim enhancing fluid collections along posterosuperior margin of the spleen and pelvic cul-de-sac, suspicious for abscesses. Left-sided transsphincteric perianal fistula. CONTRAST EXTRAVASATION CONSULTATION: Type of contrast:  Isovue 300 Site of extravasation: Upper right forearm Estimated volume of extravasation: 50 ml Area of extravasation scanned with CT? no PATIENT'S SIGNS AND SYMPTOMS Skin blistering/ulceration: no Decrease capillary refill: no Change in skin color: no Decreased motor function or severe tightness: no Decreased pulses distal to site of extravasation: no Altered sensation: no Increasing pain or signs of increased swelling during observation: no TREATMENT Observation period at site: 20 minutes Limb elevation: yes Ice packs applied: yes Heat pads  applied: No Plastic surgery consulted? no DOCUMENTATION AND FOLLOW-UP Site contrast extravasation forms submitted? yes Post extravasation orders completed? yes Was additional follow up assigned to PA's? Yes Patient's questions answered? yes Patient instructed to call [phone] or seek immediate medical care if symptoms progress. Electronically Signed   By: Earle Gell M.D.   On: 10/28/2017 12:56   US Renal  Result Date: 10/28/2017 CLINICAL DATA:  Urinary retention EXAM: LIMITED RENAL ULTRASOUND COMPARISON:  None. FINDINGS: Right Kidney: Length: 11.1 cm. Echogenicity and renal cortical  thickness are within normal limits. No mass, perinephric fluid, or hydronephrosis visualized. No sonographically demonstrable calculus or ureterectasis. Left Kidney: Patient refused to allow imaging of left flank. Bladder: Decompressed with Foley catheter. IMPRESSION: Limited study as patient refused to allow imaging of the left flank. Right kidney appears unremarkable. Urinary bladder decompressed with Foley catheter and cannot be assessed. Electronically Signed   By: Lowella Grip III M.D.   On: 10/28/2017 13:32   Ct Image Guided Drainage By Percutaneous Catheter  Result Date: 10/29/2017 INDICATION: 71 year old with a persistent anterior abdominal fluid collection near the falciform ligament of the liver. This fluid collection has been present for quite a while but the patient continues to have fevers. Complex medical history including Crohn's disease and abdominal abscess collections. EXAM: CT GUIDED DRAINAGE OF ANTERIOR ABDOMINAL ABSCESS MEDICATIONS: None ANESTHESIA/SEDATION: 4 mg IV Versed 20 mcg IV Fentanyl Moderate Sedation Time:  23 minutes The patient was continuously monitored during the procedure by the interventional radiology nurse under my direct supervision. COMPLICATIONS: None immediate. TECHNIQUE: Informed written consent was obtained from the patient after a thorough discussion of the procedural risks, benefits and alternatives. All questions were addressed. Maximal Sterile Barrier Technique was utilized including caps, mask, sterile gowns, sterile gloves, sterile drape, hand hygiene and skin antiseptic. A timeout was performed prior to the initiation of the procedure. PROCEDURE: Patient was placed supine in the CT scanner. CT images through the anterior abdomen were obtained. Anterior abdomen was prepped with chlorhexidine. Sterile field was created. Skin was anesthetized with 1% lidocaine. Using CT guidance, 18 gauge trocar needle was directed into the small fluid collection along the  inferior left hepatic lobe. Thick brown fluid was aspirated. A stiff Amplatz wire was advanced into the collection. The tract was dilated to accommodate a 10.2 [place] multipurpose drain. Catheter was attached to a suction bulb. Catheter was sutured to skin. FINDINGS: Small fluid collection along the inferior aspect of the left hepatic lobe. Thick brown fluid was aspirated from the collection. 10 French drain was placed. IMPRESSION: CT-guided placement of a drainage catheter within the small fluid collection along the inferior aspect of the left hepatic lobe. Fluid was sent for culture. Electronically Signed   By: Markus Daft M.D.   On: 10/29/2017 17:36        Scheduled Meds: . acetaminophen  1,000 mg Oral Q6H   Or  . acetaminophen (TYLENOL) oral liquid 160 mg/5 mL  1,000 mg Oral Q6H  . feeding supplement (PRO-STAT SUGAR FREE 64)  30 mL Oral BID  . fentaNYL   Intravenous Q4H  . lip balm  1 application Topical BID  . pantoprazole  40 mg Intravenous Q12H  . promethazine  6.25 mg Intravenous TID AC  . sodium chloride flush  5 mL Intravenous Q8H   Continuous Infusions: . acetaminophen    . anidulafungin Stopped (10/29/17 1923)  . meropenem (MERREM) IV Stopped (10/30/17 0531)  . potassium chloride Stopped (10/22/17 1145)  . TPN CYCLIC-ADULT (  ION) 99 mL/hr at 10/29/17 1913     LOS: 16 days    Time spent: 35 minutes.     Elmarie Shiley, MD [facility] Pager [phone]  If 7PM-7AM, please contact night-coverage [email or web address] Password [facility] 10/30/2017, 8:46 AM

## 2017-10-31 ENCOUNTER — Inpatient Hospital Stay (HOSPITAL_COMMUNITY): Payer: Medicare HMO

## 2017-10-31 LAB — CBC
HEMATOCRIT: 30.7 % — AB (ref 36.0–46.0)
Hemoglobin: 9.8 g/dL — ABNORMAL LOW (ref 12.0–15.0)
MCH: 26.8 pg (ref 26.0–34.0)
MCHC: 31.9 g/dL (ref 30.0–36.0)
MCV: 84.1 fL (ref 78.0–100.0)
PLATELETS: 364 10*3/uL (ref 150–400)
RBC: 3.65 MIL/uL — ABNORMAL LOW (ref 3.87–5.11)
RDW: 18.8 % — AB (ref 11.5–15.5)
WBC: 34.8 10*3/uL — ABNORMAL HIGH (ref 4.0–10.5)

## 2017-10-31 LAB — FUNGUS CULTURE, BLOOD
Culture: NO GROWTH
SPECIAL REQUESTS: ADEQUATE

## 2017-10-31 LAB — LACTIC ACID, PLASMA: Lactic Acid, Venous: 1.1 mmol/L (ref 0.5–1.9)

## 2017-10-31 LAB — PHOSPHORUS: PHOSPHORUS: 3.9 mg/dL (ref 2.5–4.6)

## 2017-10-31 LAB — GLUCOSE, CAPILLARY
Glucose-Capillary: 113 mg/dL — ABNORMAL HIGH (ref 65–99)
Glucose-Capillary: 112 mg/dL — ABNORMAL HIGH (ref 65–99)

## 2017-10-31 MED ORDER — NAPHAZOLINE-GLYCERIN 0.012-0.2 % OP SOLN
1.0000 [drp] | Freq: Four times a day (QID) | OPHTHALMIC | Status: DC | PRN
  Administered 2017-10-31: 2 [drp] via OPHTHALMIC
  Administered 2017-11-01: 1 [drp] via OPHTHALMIC
  Filled 2017-10-31: qty 15

## 2017-10-31 MED ORDER — CHLORHEXIDINE GLUCONATE 0.12 % MT SOLN
15.0000 mL | Freq: Two times a day (BID) | OROMUCOSAL | Status: DC
  Administered 2017-11-01 – 2017-11-03 (×5): 15 mL via OROMUCOSAL
  Filled 2017-10-31 (×5): qty 15

## 2017-10-31 MED ORDER — TRAVASOL 10 % IV SOLN
INTRAVENOUS | Status: AC
  Administered 2017-10-31: 18:00:00 via INTRAVENOUS
  Filled 2017-10-31: qty 840

## 2017-10-31 MED ORDER — LINEZOLID 600 MG/300ML IV SOLN
600.0000 mg | Freq: Two times a day (BID) | INTRAVENOUS | Status: DC
  Administered 2017-10-31 – 2017-11-04 (×8): 600 mg via INTRAVENOUS
  Filled 2017-10-31 (×9): qty 300

## 2017-10-31 MED ORDER — ORAL CARE MOUTH RINSE
15.0000 mL | Freq: Two times a day (BID) | OROMUCOSAL | Status: DC
  Administered 2017-11-02 – 2017-11-04 (×5): 15 mL via OROMUCOSAL

## 2017-10-31 MED ORDER — ACETAMINOPHEN 10 MG/ML IV SOLN
1000.0000 mg | Freq: Four times a day (QID) | INTRAVENOUS | Status: AC | PRN
  Administered 2017-10-31 – 2017-11-01 (×3): 1000 mg via INTRAVENOUS
  Filled 2017-10-31 (×2): qty 100

## 2017-10-31 MED ORDER — ACETAMINOPHEN 10 MG/ML IV SOLN
1000.0000 mg | Freq: Four times a day (QID) | INTRAVENOUS | Status: DC
  Administered 2017-10-31: 1000 mg via INTRAVENOUS
  Filled 2017-10-31 (×3): qty 100

## 2017-10-31 MED ORDER — LIDOCAINE HCL (PF) 1 % IJ SOLN
INTRAMUSCULAR | Status: AC
  Filled 2017-10-31: qty 30

## 2017-10-31 NOTE — Progress Notes (Signed)
Verified with central tele that Qtc is 0.42 for phenergan administration

## 2017-10-31 NOTE — Progress Notes (Signed)
INFECTIOUS DISEASE PROGRESS NOTE  ID: Danielle [name] is a 71 y.o. female with  Principal Problem:   Septic shock ([place]) Active Problems:   Leukocytosis   Perianal Crohn's disease, with fistula ([place])   Acute renal failure superimposed on stage 3 chronic kidney disease ([place])   Hiatal hernia   Immunosuppressed status ([place])   Chest tube in place   Hypotension   Vomiting in adult patient   S/P thoracentesis   Anasarca   DVT (deep venous thrombosis) (HCC)   Protein-calorie malnutrition, moderate (HCC)   Mediastinal abscess (HCC)   Mediastinal post-op seromas   Pleural effusion, left   Patient's noncompliance with intervention / medical advice    Dehydration   Acute embolism and thrombosis of left axillary vein ([place])   Goals of care, counseling/discussion   Palliative care encounter   Fever   Intra-abdominal infection   Hemoptysis   Abscess   Intractable vomiting with nausea  Subjective: Awake but does not vocalize to me (just spoke with RN)  Abtx:  Anti-infectives (From admission, onward)   Start     Dose/Rate Route Frequency Ordered Stop   10/27/17 1400  meropenem (MERREM) 1 g in sodium chloride 0.9 % 100 mL IVPB     1 g 200 mL/hr over 30 Minutes Intravenous Every 8 hours 10/27/17 1245     10/27/17 1100  levofloxacin (LEVAQUIN) IVPB 750 mg  Status:  Discontinued     750 mg 100 mL/hr over 90 Minutes Intravenous Every 24 hours 10/27/17 1041 10/27/17 1218   10/27/17 0400  piperacillin-tazobactam (ZOSYN) IVPB 3.375 g  Status:  Discontinued     3.375 g 12.5 mL/hr over 240 Minutes Intravenous Every 8 hours 10/26/17 2201 10/27/17 1222   10/22/17 0600  vancomycin (VANCOCIN) IVPB 1000 mg/200 mL premix  Status:  Discontinued     1,000 mg 200 mL/hr over 60 Minutes Intravenous To Short Stay 10/19/2017 1631 10/17/2017 1637   10/25/2017 2100  vancomycin (VANCOCIN) IVPB 1000 mg/200 mL premix     1,000 mg 200 mL/hr over 60 Minutes Intravenous Every 12 hours 10/13/2017 1631 10/19/2017 2246   10/20/2017 0900  vancomycin (VANCOCIN) IVPB 1000 mg/200 mL premix     1,000 mg 200 mL/hr over 60 Minutes Intravenous To Surgery 10/11/2017 0857 10/09/2017 0957   10/17/17 0800  vancomycin (VANCOCIN) IVPB 750 mg/150 ml premix  Status:  Discontinued     750 mg 150 mL/hr over 60 Minutes Intravenous Every 24 hours 10/16/17 1220 10/17/17 1023   10/16/17 1800  anidulafungin (ERAXIS) 100 mg in sodium chloride 0.9 % 100 mL IVPB     100 mg 78 mL/hr over 100 Minutes Intravenous Every 24 hours 10/15/17 1657     10/16/17 0600  vancomycin (VANCOCIN) IVPB 1000 mg/200 mL premix  Status:  Discontinued     1,000 mg 200 mL/hr over 60 Minutes Intravenous Every 36 hours 10/25/2017 2308 10/16/17 1220   10/15/17 1800  anidulafungin (ERAXIS) 200 mg in sodium chloride 0.9 % 200 mL IVPB     200 mg 78 mL/hr over 200 Minutes Intravenous  Once 10/15/17 1657 10/15/17 2148   10/25/2017 2330  piperacillin-tazobactam (ZOSYN) IVPB 3.375 g  Status:  Discontinued     3.375 g 100 mL/hr over 30 Minutes Intravenous Every 8 hours 10/22/2017 2257 10/09/2017 2259   10/12/2017 2315  piperacillin-tazobactam (ZOSYN) IVPB 3.375 g  Status:  Discontinued     3.375 g 12.5 mL/hr over 240 Minutes Intravenous Every 8 hours 10/10/2017 2300 10/26/17 2201  10/10/2017 1930  vancomycin (VANCOCIN) IVPB 1000 mg/200 mL premix     1,000 mg 200 mL/hr over 60 Minutes Intravenous  Once 10/17/2017 1915 10/27/2017 2044   10/13/2017 1530  piperacillin-tazobactam (ZOSYN) IVPB 3.375 g     3.375 g 100 mL/hr over 30 Minutes Intravenous  Once 10/22/2017 1529 11/03/2017 1604      Medications:  Scheduled: . acetaminophen  1,000 mg Oral Q6H   Or  . acetaminophen (TYLENOL) oral liquid 160 mg/5 mL  1,000 mg Oral Q6H  . feeding supplement (PRO-STAT SUGAR FREE 64)  30 mL Oral BID  . fentaNYL   Intravenous Q4H  . pantoprazole  40 mg Intravenous Q12H  . promethazine  6.25 mg Intravenous TID AC  . sodium chloride flush  5 mL Intravenous Q8H    Objective: Vital signs in last 24  hours: Temp:  [99.3 F (37.4 C)-103.3 F (39.6 C)] 100.1 F (37.8 C) (11/25 0714) Pulse Rate:  [59-123] 110 (11/25 0724) Resp:  [20-47] 20 (11/25 0903) BP: (86-134)/(48-82) 91/48 (11/25 0724) SpO2:  [92 %-100 %] 100 % (11/25 0903)   General appearance: alert and moderate distress Resp: clear to auscultation bilaterally Chest wall: no tenderness, L upper chest central line is clean, no erythema Cardio: tachycardia GI: abnormal findings:  hypoactive bowel sounds and soft, non-tender.   Lab Results Recent Labs    10/30/17 0845 10/30/17 2156 10/31/17 0429  WBC 24.1*  --  34.8*  HGB 10.1*  --  9.8*  HCT 31.2*  --  30.7*  NA 132* 131*  --   K 5.5* 5.1  --   CL 105 107  --   CO2 19* 17*  --   BUN 21* 18  --   CREATININE 0.86 0.95  --    Liver Panel No results for input(s): PROT, ALBUMIN, AST, ALT, ALKPHOS, BILITOT, BILIDIR, IBILI in the last 72 hours. Sedimentation Rate No results for input(s): ESRSEDRATE in the last 72 hours. C-Reactive Protein No results for input(s): CRP in the last 72 hours.  Microbiology: Recent Results (from the past 240 hour(s))  Culture, blood (routine x 2)     Status: None   Collection Time: 10/24/17 12:06 AM  Result Value Ref Range Status   Specimen Description BLOOD LEFT FOOT  Final   Special Requests   Final    BOTTLES DRAWN AEROBIC AND ANAEROBIC Blood Culture adequate volume   Culture NO GROWTH 5 DAYS  Final   Report Status 10/29/2017 FINAL  Final  Culture, blood (routine x 2)     Status: None   Collection Time: 10/24/17 12:16 AM  Result Value Ref Range Status   Specimen Description BLOOD RIGHT FOOT  Final   Special Requests   Final    BOTTLES DRAWN AEROBIC ONLY Blood Culture results may not be optimal due to an inadequate volume of blood received in culture bottles   Culture NO GROWTH 5 DAYS  Final   Report Status 10/29/2017 FINAL  Final  Fungus culture, blood     Status: None (Preliminary result)   Collection Time: 10/24/17 12:16 AM    Result Value Ref Range Status   Specimen Description BLOOD LEFT FOOT  Final   Special Requests   Final    BOTTLES DRAWN AEROBIC AND ANAEROBIC Blood Culture adequate volume   Culture NO GROWTH 6 DAYS  Final   Report Status PENDING  Incomplete  Culture, blood (routine x 2)     Status: None   Collection Time: 10/24/17  3:50 PM  Result Value Ref Range Status   Specimen Description BLOOD RIGHT FOOT  Final   Special Requests IN PEDIATRIC BOTTLE Blood Culture adequate volume  Final   Culture NO GROWTH 5 DAYS  Final   Report Status 10/29/2017 FINAL  Final  Culture, blood (routine x 2)     Status: None (Preliminary result)   Collection Time: 10/27/17  1:19 PM  Result Value Ref Range Status   Specimen Description BLOOD RIGHT HAND  Final   Special Requests IN PEDIATRIC BOTTLE Blood Culture adequate volume  Final   Culture NO GROWTH 3 DAYS  Final   Report Status PENDING  Incomplete  Culture, blood (routine x 2)     Status: None (Preliminary result)   Collection Time: 10/27/17  1:24 PM  Result Value Ref Range Status   Specimen Description BLOOD RIGHT HAND  Final   Special Requests IN PEDIATRIC BOTTLE Blood Culture adequate volume  Final   Culture NO GROWTH 3 DAYS  Final   Report Status PENDING  Incomplete  Culture, Urine     Status: None   Collection Time: 10/27/17  4:44 PM  Result Value Ref Range Status   Specimen Description URINE, RANDOM  Final   Special Requests NONE  Final   Culture NO GROWTH  Final   Report Status 10/28/2017 FINAL  Final  Aerobic/Anaerobic Culture (surgical/deep wound)     Status: None (Preliminary result)   Collection Time: 10/29/17  4:17 PM  Result Value Ref Range Status   Specimen Description ABSCESS  Final   Special Requests ABDOMEN  Final   Gram Stain   Final    ABUNDANT WBC PRESENT, PREDOMINANTLY PMN RARE GRAM POSITIVE COCCI IN PAIRS RARE BUDDING YEAST SEEN    Culture PENDING  Incomplete   Report Status PENDING  Incomplete  C difficile quick scan w PCR  reflex     Status: None   Collection Time: 10/29/17  4:25 PM  Result Value Ref Range Status   C Diff antigen NEGATIVE NEGATIVE Final   C Diff toxin NEGATIVE NEGATIVE Final   C Diff interpretation No C. difficile detected.  Final    Studies/Results: [place] Chest (pleural Effusion)  Result Date: 10/31/2017 CLINICAL DATA:  71 year old female with referral for thoracentesis. EXAM: CHEST ULTRASOUND COMPARISON:  10/28/2017 FINDINGS: Ultrasound survey demonstrates small fluid at the right lung base. Thoracentesis deferred at this time. IMPRESSION: Ultrasound survey demonstrates small fluid at the right lung base. Electronically Signed   By: Corrie Mckusick D.O.   On: 10/31/2017 10:51   Ct Image Guided Drainage By Percutaneous Catheter  Result Date: 10/29/2017 INDICATION: 71 year old with a persistent anterior abdominal fluid collection near the falciform ligament of the liver. This fluid collection has been present for quite a while but the patient continues to have fevers. Complex medical history including Crohn's disease and abdominal abscess collections. EXAM: CT GUIDED DRAINAGE OF ANTERIOR ABDOMINAL ABSCESS MEDICATIONS: None ANESTHESIA/SEDATION: 4 mg IV Versed 20 mcg IV Fentanyl Moderate Sedation Time:  23 minutes The patient was continuously monitored during the procedure by the interventional radiology nurse under my direct supervision. COMPLICATIONS: None immediate. TECHNIQUE: Informed written consent was obtained from the patient after a thorough discussion of the procedural risks, benefits and alternatives. All questions were addressed. Maximal Sterile Barrier Technique was utilized including caps, mask, sterile gowns, sterile gloves, sterile drape, hand hygiene and skin antiseptic. A timeout was performed prior to the initiation of the procedure. PROCEDURE: Patient was placed supine in the CT  scanner. CT images through the anterior abdomen were obtained. Anterior abdomen was prepped with chlorhexidine.  Sterile field was created. Skin was anesthetized with 1% lidocaine. Using CT guidance, 18 gauge trocar needle was directed into the small fluid collection along the inferior left hepatic lobe. Thick brown fluid was aspirated. A stiff Amplatz wire was advanced into the collection. The tract was dilated to accommodate a 10.2 [place] multipurpose drain. Catheter was attached to a suction bulb. Catheter was sutured to skin. FINDINGS: Small fluid collection along the inferior aspect of the left hepatic lobe. Thick brown fluid was aspirated from the collection. 10 French drain was placed. IMPRESSION: CT-guided placement of a drainage catheter within the small fluid collection along the inferior aspect of the left hepatic lobe. Fluid was sent for culture. Electronically Signed   By: Markus Daft M.D.   On: 10/29/2017 17:36     Assessment/Plan: Hiatal hernia repair 10-3 Intra-abd abscess             IR drain 11-23 Mediastinal abscess (VATS 11-15) Fungemia (C glabrata) Protein calorie malnutrition, severe.              TPN Skin breakdown sacrum RUE DVT Crohn's disease Palliative Care following  Total days of antibiotics: 16  11/8 Vancomycin >>11/11; 11/15 >>11/16 (surg ppx) 11/8 Zosyn >>11/21 11/21 Levaquin>> 11/21  11/9 Anidulafungin >> 11/21 Meropenem >>  She continues to have high fever and worsening WBC Will add zyvox (for concern for VRE given GPC in her 11-23 stain) Await Cx from 11-23 (yeast GPC) Repeat BCx sent today Suspect she will continue to have fever while she has abscesses.           Bobby Rumpf MD, FACP Infectious Diseases (pager) [phone] [email or web address] 10/31/2017, 11:00 AM  LOS: 17 days

## 2017-10-31 NOTE — Progress Notes (Signed)
[facility] NOTE   Pharmacy Consult for TPN Indication: Poor PO intake / unable to place repeat G-tube   Patient Measurements: Height: 5' 4.5" (163.8 cm) Weight: 123 lb 0.3 oz (55.8 kg) IBW/kg (Calculated) : 55.85 TPN AdjBW (KG): 59 Body mass index is 20.79 kg/m.  Assessment:  26 yoF re-admit 11/8 with N/V, loose stools (baseline w/Crohn's). Was hospitalized for a month in October and developed fungemia and treated with Invanz and anidulafungin. Re-admitted a week later with intermittent fevers and difficultly swallowing. CT chest showed recurrent mediastinal fluid abscess.  PMH: Crohns, HH, hx SBO  Overnight events: RN this AM informed me that TPN rate was never increased from 49 mL/hr to 99 mL/hr until 7:30 AM. Patient received 49 mL/hr rate for 13 hours. Requested RN to continue rate of 99 mL/hr past the stop time of 12 until 1700 PM tonight then decrease to 49 mL/hr until new bag is hung at 1800. Pharmacy to Nursing order in place.   Significant events:  11/15 Left VATs to drain mediastinum  11/23 IR guided perihepatic abscess drain placement   GI: Mediastinal abscess complex, had been advanced to full liquid diet but has nausea and vomiting with almost anything PO. Had 4 episodes of emesis earlier this week but volume not charted. Clear liquids for now. Patient has poor appetite and still experiencing nausea with any type of PO intake. Phenergan low dose d/t Qtc TID and monitoring for each dose. Avoiding Zofran. Prn Compazine. Patient refuses feeding tube. Attempting full liquids today. GOC being assessed daily. Albumin low at 1.3. Last BM was 11/24.  Endo: CBGs low at 62-83 off TPN and 72-129 on cycle. Sliding scale insulin stopped on 11/21. Insulin requirements in the past 24 hours: 0 unit regular insulin  Lytes: Na down 131, Cl wnl. K down to 5.1 after decreasing potassium in TPN. Others wnl. CoCa 10.7. Renal: SCr up 0.95, BUN down 18. UOP  2.31mL/kg/hr. Net positive +1.8L. I/O - 1L documented last 24 hours. Neuro: PCA- pain score 4. Pulm: RA; Bilateral chest tube output at 80 mL / 24 hours, chest tubes to remain in place for several days. R-effusion larger 11/23 - considering thoracentesis. Cards: BP soft, ST 100-115, 5bts SVT on Friday.  Hepatobil: AST/ALT and Tbili wnl. Trig 83 ID: Merrem + Anidulafungin for mediastinal abscess. Tmax of 104.4, WBC further up 34.8. LA 1.1. Drain in place. Cdiff negative. Abdominal abscess gm stain with rare GPC pairs and rare budding yeast. Culture still pending. 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 Last ID note 11/21 - continue TPN for now. Repeat blood cultures sent 11/25.  Best Practices: SCDs, PPI IV TPN Access: PICC 11/11>> TPN start date: 11/11>>  Nutritional Goals (per RD recommendation on 11/19): Kcal:  1500-1700 Protein:  75-90 grams  Fluid:  >/= 1.5 L/day  Current Nutrition:  Clear liquid (appetite poor)>> attempting full liquids 11/24 Custom TPN   Plan:  Change to contiuous TPN at 70 mL/hr over 24 hr due to hypoglycemia when off cycle and worsening clinical status.  TPN provides 84 g of protein, 235 g of dextrose, and 50 g lipids providing 1,640 total kcal, which meets ~100% of patient needs.  Electrolytes in TPN: decrease potassium, increase sodium, continue max acetate AddMVI,trace elements in TPN Continue CBG checks for hypoglycemic events Monitor TPN labs, clinical progress  F/U ability to tolerate PO diet, GOC    Sloan Leiter, PharmD, BCPS, BCCCP Clinical Pharmacist Clinical  phone 10/31/2017 until 3:30PM - #[phone] After hours, please call #[phone] 10/31/2017 7:01 AM

## 2017-10-31 NOTE — Progress Notes (Addendum)
[address]       [place] [address]             [phone]      10 Days Post-Op Procedure(s) (LRB): LEFT VIDEO ASSISTED THORACOSCOPY (VATS) for MEDISTINAL DRAINAGE (Left) Subjective: Says she feels a little better today, conts to spike impressive fevers(104.4 tmax per notes)  Objective: Vital signs in last 24 hours: Temp:  [99.3 F (37.4 C)-103.3 F (39.6 C)] 100.1 F (37.8 C) (11/25 0714) Pulse Rate:  [59-123] 110 (11/25 0724) Cardiac Rhythm: Sinus tachycardia (11/25 0724) Resp:  [20-47] 20 (11/25 0903) BP: (86-134)/(48-82) 91/48 (11/25 0724) SpO2:  [92 %-100 %] 100 % (11/25 0903)  Hemodynamic parameters for last 24 hours:    Intake/Output from previous day: 11/24 0701 - 11/25 0700 In: 2634.2 [I.V.:1989.2; IV Piggyback:630] Out: 9735 [Urine:3625; Drains:12] Intake/Output this shift: Total I/O In: -  Out: 455 [Urine:450; Drains:5]  General appearance: alert, cooperative, fatigued and no distress Heart: regular rate and rhythm and tachy Lungs: clear anteriorly Abdomen: soft, nontender Extremities: no calf tenderness or edema  Lab Results: Recent Labs    10/30/17 0845 10/31/17 0429  WBC 24.1* 34.8*  HGB 10.1* 9.8*  HCT 31.2* 30.7*  PLT 361 364   BMET:  Recent Labs    10/30/17 0845 10/30/17 2156  NA 132* 131*  K 5.5* 5.1  CL 105 107  CO2 19* 17*  GLUCOSE 83 129*  BUN 21* 18  CREATININE 0.86 0.95  CALCIUM 8.4* 8.5*    PT/INR:  Recent Labs    10/29/17 1115  LABPROT 15.5*  INR 1.24   ABG    Component Value Date/Time   PHART 7.317 (L) 10/22/2017 0420   HCO3 17.9 (L) 10/22/2017 0420   TCO2 15 (L) 09/10/2017 1214   ACIDBASEDEF 7.1 (H) 10/22/2017 0420   O2SAT 96.1 10/22/2017 0420   CBG (last 3)  Recent Labs    10/30/17 1958 10/30/17 2049 10/31/17 0758  GLUCAP 68 72 113*    Meds Scheduled Meds: . acetaminophen  1,000 mg Oral Q6H   Or  . acetaminophen (TYLENOL) oral liquid 160 mg/5 mL  1,000 mg Oral Q6H  .  feeding supplement (PRO-STAT SUGAR FREE 64)  30 mL Oral BID  . fentaNYL   Intravenous Q4H  . pantoprazole  40 mg Intravenous Q12H  . promethazine  6.25 mg Intravenous TID AC  . sodium chloride flush  5 mL Intravenous Q8H   Continuous Infusions: . anidulafungin Stopped (10/30/17 1848)  . dextrose 10 mL/hr at 10/31/17 0700  . meropenem (MERREM) IV Stopped (10/31/17 0612)  . potassium chloride Stopped (10/22/17 1145)  . TPN CYCLIC-ADULT (ION) 49 mL/hr at 10/30/17 2000   PRN Meds:.diphenhydrAMINE **OR** diphenhydrAMINE, fentaNYL (SUBLIMAZE) injection, hydrocortisone, lip balm, liver oil-zinc oxide, naloxone **AND** sodium chloride flush, nystatin, oxyCODONE, phenol, potassium chloride, prochlorperazine, promethazine, sodium chloride flush, traMADol  Xrays Ct Image Guided Drainage By Percutaneous Catheter  Result Date: 10/29/2017 INDICATION: 71 year old with a persistent anterior abdominal fluid collection near the falciform ligament of the liver. This fluid collection has been present for quite a while but the patient continues to have fevers. Complex medical history including Crohn's disease and abdominal abscess collections. EXAM: CT GUIDED DRAINAGE OF ANTERIOR ABDOMINAL ABSCESS MEDICATIONS: None ANESTHESIA/SEDATION: 4 mg IV Versed 20 mcg IV Fentanyl Moderate Sedation Time:  23 minutes The patient was continuously monitored during the procedure by the interventional radiology nurse under my direct supervision. COMPLICATIONS: None immediate. TECHNIQUE: Informed written  consent was obtained from the patient after a thorough discussion of the procedural risks, benefits and alternatives. All questions were addressed. Maximal Sterile Barrier Technique was utilized including caps, mask, sterile gowns, sterile gloves, sterile drape, hand hygiene and skin antiseptic. A timeout was performed prior to the initiation of the procedure. PROCEDURE: Patient was placed supine in the CT scanner. CT images through  the anterior abdomen were obtained. Anterior abdomen was prepped with chlorhexidine. Sterile field was created. Skin was anesthetized with 1% lidocaine. Using CT guidance, 18 gauge trocar needle was directed into the small fluid collection along the inferior left hepatic lobe. Thick brown fluid was aspirated. A stiff Amplatz wire was advanced into the collection. The tract was dilated to accommodate a 10.2 [place] multipurpose drain. Catheter was attached to a suction bulb. Catheter was sutured to skin. FINDINGS: Small fluid collection along the inferior aspect of the left hepatic lobe. Thick brown fluid was aspirated from the collection. 10 French drain was placed. IMPRESSION: CT-guided placement of a drainage catheter within the small fluid collection along the inferior aspect of the left hepatic lobe. Fluid was sent for culture. Electronically Signed   By: Markus Daft M.D.   On: 10/29/2017 17:36    Assessment/Plan: S/P Procedure(s) (LRB): LEFT VIDEO ASSISTED THORACOSCOPY (VATS) for MEDISTINAL DRAINAGE (Left)   1 clinically appears stable but critically ill- conts management by primary and current consultants- no changes from CT surgery perspective. Keep tube in place   LOS: 17 days    Danielle [name] 10/31/2017  I have seen and examined the patient and agree with the assessment and plan as outlined.  Plan per medical teams.  Keep chest tube in place.  Rexene Alberts, MD 10/31/2017 10:16 AM

## 2017-10-31 NOTE — Progress Notes (Signed)
Received verbal order to renew tylenol IV prn, pt temp up to 104.4 and heart rate 135 ST. Cooling blanket on pt and notified for tylenol from pharmacy

## 2017-10-31 NOTE — Progress Notes (Signed)
Patient ID: Danielle [name], female   DOB: 1946/07/01, 71 y.o.   MRN: [id number]  Pt scheduled for thoracentesis today  Limited Chest US performed Very small right pleural effusion No real safe window Pt agitated and difficult to hold position  No thoracentesis performed

## 2017-10-31 NOTE — Progress Notes (Signed)
Spoke with Janett Billow , pharmacist, reguarding rate of TPN. She instructed me to keep rate at 69ml/hr until 1700 then decrease to 43ml/hr for one hour then turn off TPN. Verify plan and instructions in her progress note.

## 2017-10-31 NOTE — Progress Notes (Signed)
PROGRESS NOTE    Danielle [name]  WKM:[id number] DOB: 11/16/1946 DOA: 10/07/2017 PCP: Iona Beard, MD    Brief Narrative: 71 y.o.F w/ a Hxof Crohn's disease, hiatal hernia,psoriasis, and SBO who underwent a type IV hiatal hernia repair in October 2018.Postoperatively the patient required endotracheal intubation, mechanical ventilation and the use of pressors. She was extubated on postop day #2. She improved clinically and was on parenteral nutrition, however she developed a pelvic fluid collection which required a drain. On 09/11/2017 she had blood cultures positive for Candida glabrata, and the pelvic abscess culture was positive for C. Albicans and C. Glabrata. She was given treatment withparenteral antifungals. She subsequently improved, was able to ambulate with physical therapy, weaned off of parenteral nutrition and was tolerating oral diet. ID recommended to continue Invanz andErasisfor 2 weeks. A PICC line was placed in her right arm and she was ultimately discharged home.  She returned on 10/01/2017 due to intermittent fevers. She was treated empirically and clinically improved to the point that she was able to be d/c home again on 11/1.  She returned to the ED 11/8 with complaints of persistent nausea associated with emesis. CT abdomen pelvis showed re accumulation of fluid collection in the lower mediastinum, bibasilar pleural effusion, another small complex collection along the posterior margin of the spleen. Patient underwent left video assisted thoracoscopy for mediastinal drainage of mediastinal abscess. ID has been helping with management of IV antibiotics.   Patient continue to spike fevers, persistent nausea and vomiting. She has been on TPN, which was discontinue due to persistent fevers.   Palliative care team met with patient and her family. Patients wants to continue with aggressive care.    Assessment & Plan:   Principal Problem:   Septic shock  ([place]) Active Problems:   Leukocytosis   Perianal Crohn's disease, with fistula ([place])   Acute renal failure superimposed on stage 3 chronic kidney disease (HCC)   Hiatal hernia   Immunosuppressed status (HCC)   Chest tube in place   Hypotension   Vomiting in adult patient   S/P thoracentesis   Anasarca   DVT (deep venous thrombosis) (HCC)   Protein-calorie malnutrition, moderate (HCC)   Mediastinal abscess (HCC)   Mediastinal post-op seromas   Pleural effusion, left   Patient's noncompliance with intervention / medical advice    Dehydration   Acute embolism and thrombosis of left axillary vein (HCC)   Goals of care, counseling/discussion   Palliative care encounter   Fever   Intra-abdominal infection   Hemoptysis   Abscess   Intractable vomiting with nausea   SevereSepsis/Mediastinal abscess; Patient underwent esophagogram showing 2.5 cm area of extrinsic compression upon esophagus, underwent also thoracentesis. Case was reviewed by IR and gastroenterologist, recommendation was for CVTS consult.  -Patient underwent VAST drainage 11-15. -S/P VATS drainage on 11/15. Patient continue to spike fever, leukocytosis. .  Repeated CT scan 11-19; Ill-defined multiloculated rim enhancing fluid collection and extensive edema in posterior mediastinum inferior to mediastinal drain and anterior to aorta, probably representing the residual of drained abscess. Discrete 14 mm abscess present in the right posterior mediastinum. Multiloculated rim enhancing fluid collection surrounding the spleen and extending into left pericolic gutter, likely abscess. Small bilateral pleural effusions with left pleural drain. Stable dependent lower lobe consolidations may represent atelectasis or pneumonia. -Per CVTS mediastinal drain in good position, still has phlegmon in the area.  -CT abdomen pelvis; near complete resolution of mediastinal abscess. Stable small rim enhancing fluid collections along  posterosuperior margin of the spleen and pelvic cul-de-sac, suspicious for abscesses. -IV meropenem and Eraxis.  -underwent CT guide placement of 10 Fr drain in anterior abdominal abscess. Fluid growing yeast and Gram positive cocci in pairs. Will follow ID recommendation for antibiotics.  Continue to spike fever, WBC increased. Started on Zyvox 11-25.  Right side pleural effusion;  Patient agree to proceed with thoracentesis but tomorrow.  [place] perform not significant fluid to be removed.   Persistent fevers, leukocytosis;  Multifactorial, abdominal abscess, mediastinal abscess.   Type IV hiatal hernia repair in October 2018. Post operative pelvic abscess;  CT chest 11-19. For Multiloculated rim enhancing fluid collection surrounding the spleen and extending into left pericolic gutter, likely abscess. -spiking fever, leukocytosis.  -CT abdomen and pelvis;  near complete resolution of mediastinal abscess. Stable small rim enhancing fluid collections along posterosuperior margin of the spleen and pelvic cul-de-sac, suspicious for abscesses. Plan for IR abscess aspiration, possible drain placement -underwent CT guide placement of 10 Fr drain in anterior abdominal abscess. Fluid growing yeast and Gram positive cocci in pairs. Culture still pending.  Started Zyvox.   Known candida galbatra infection; culture 10-12, pelvis abscess; on Eraxis. ID following   History of Crohns/ chronic diarrhea  C diff 11-10 was negative.  CT abdomen with Mild increase in wall thickening and adjacent inflammatory changes involving the distal ileum, with mild dilatation of several small bowel loops proximal to this site. This is consistent with Crohn's disease and stricture formation. -C diff negative.  -No candidate for treatment for Crohn's  per GI.   Urinary Retention; continue with foley catheter; will need voiding trial.   Nutrition;  Nutrition started on TPN.  Patient with a recent fungemia with candida  glabrata and albicans.  Per Dr Fredderick Erb ok to continue with TPN for now.  Per Dr Rosendo Gros; oral trial. He doubt G tube will be stable.  Follow Sx recommendation regarding Jejunostomy tube  Anemia; no further hematemesis.  Hb slowly trending down. Monitor daily   Chronic diastolic CHF Compensated.   Recent Hematemesis/hemoptysis -stopped IV heparin  -lateral upper extremity DVT 09/2017.  -positive DVT in the left radial vein at this time -no recurrent hematemesis or hemoptysis. IV Protonix drip -CT scan of the chest shows evidence of esophageal wall thickening consistent with esophagitis -GI following.   RUE DVT; holding heparin due to hematemesis. When hb stable plan to resume heparin if clear by GI.       DVT prophylaxis: SCD.  Code Status: full code.  Family Communication: care discussed with patient.  Disposition Plan: remain in patient for IV antibiotics.    Consultants:   General surgery  CVTS  GI   Procedures: events; Significant Events: 11/8 readmit 11/11 PICC placed 11/12 thoracentesis 11/5 to OR at [facility] for L VATS for mediastinal drainage     Antimicrobials; antifungal  Erasix 10-16-2017  Meropenem 10-27-2017     Subjective: Having fevers, still with chest pain at tube site.    Objective: Vitals:   10/31/17 0724 10/31/17 0903 10/31/17 1154 10/31/17 1250  BP: (!) 91/48  (!) 152/74   Pulse: (!) 110  85   Resp: (!) 30 20 (!) 31 (!) 35  Temp:   (!) 104.3 F (40.2 C)   TempSrc:   Rectal   SpO2: 100% 100% 97% 100%  Weight:      Height:        Intake/Output Summary (Last 24 hours) at 10/31/2017 1326 Last data filed at 10/31/2017 1210  Gross per 24 hour  Intake 1736.67 ml  Output 3637 ml  Net -1900.33 ml   Filed Weights   10/15/17 1449 10/20/17 1600 10/07/2017 0500  Weight: 55.9 kg (123 lb 3.8 oz) 55.3 kg (121 lb 14.6 oz) 55.8 kg (123 lb 0.3 oz)    Examination:  General exam: Chronic ill appearing, shivering.  Respiratory  system;Tachypnea. Bilateral crackles.  Cardiovascular system: S 1. S 2 RRR Gastrointestinal system:BS present, soft, nt, abdominal wound cover.  Central nervous system: non focal.  Extremities: no edema   Data Reviewed: I have personally reviewed following labs and imaging studies  CBC: Recent Labs  Lab 10/25/17 0005  10/26/17 0323 10/27/17 0500 10/28/17 0410 10/29/17 0315 10/30/17 0845 10/31/17 0429  WBC 24.9*   < > 23.9* 19.5* 18.3* 20.6* 24.1* 34.8*  NEUTROABS 18.2*  --  17.0* 13.1*  --   --   --   --   HGB 9.6*   < > 9.2* 8.9* 8.6* 8.9* 10.1* 9.8*  HCT 29.5*   < > 28.4* 28.1* 27.4* 28.0* 31.2* 30.7*  MCV 84.0   < > 84.3 85.4 93.5 84.6 84.6 84.1  PLT 274   < > 253 266 280 286 361 364   < > = values in this interval not displayed.   Basic Metabolic Panel: Recent Labs  Lab 10/25/17 0005  10/27/17 0500 10/28/17 0500 10/29/17 0315 10/30/17 0845 10/30/17 2156 10/31/17 0429  NA 135   < > 134* 135 135 132* 131*  --   K 3.5   < > 4.5 4.5 4.6 5.5* 5.1  --   CL 109   < > 106 107 106 105 107  --   CO2 20*   < > '23 22 22 '$ 19* 17*  --   GLUCOSE 124*   < > 116* 110* 109* 83 129*  --   BUN 15   < > '18 18 18 '$ 21* 18  --   CREATININE 0.84   < > 0.85 0.87 0.88 0.86 0.95  --   CALCIUM 7.7*   < > 7.9* 7.8* 8.2* 8.4* 8.5*  --   MG 1.9  --   --  1.9  --   --  1.9  --   PHOS 3.4  --   --  4.2  --   --   --  3.9   < > = values in this interval not displayed.   GFR: Estimated Creatinine Clearance: 49.2 mL/min (by C-G formula based on SCr of 0.95 mg/dL). Liver Function Tests: Recent Labs  Lab 10/25/17 0005 10/28/17 0500  AST 32 21  ALT 28 16  ALKPHOS 145* 120  BILITOT 0.3 0.3  PROT 6.0* 6.0*  ALBUMIN 1.5* 1.3*   No results for input(s): LIPASE, AMYLASE in the last 168 hours. No results for input(s): AMMONIA in the last 168 hours. Coagulation Profile: Recent Labs  Lab 10/29/17 1115  INR 1.24   Cardiac Enzymes: No results for input(s): CKTOTAL, CKMB, CKMBINDEX, TROPONINI  in the last 168 hours. BNP (last 3 results) No results for input(s): PROBNP in the last 8760 hours. HbA1C: No results for input(s): HGBA1C in the last 72 hours. CBG: Recent Labs  Lab 10/29/17 2019 10/30/17 1617 10/30/17 1958 10/30/17 2049 10/31/17 0758  GLUCAP 103* 62* 68 72 113*   Lipid Profile: No results for input(s): CHOL, HDL, LDLCALC, TRIG, CHOLHDL, LDLDIRECT in the last 72 hours. Thyroid Function Tests: No results for input(s): TSH, T4TOTAL, FREET4, T3FREE, THYROIDAB in the  last 72 hours. Anemia Panel: No results for input(s): VITAMINB12, FOLATE, FERRITIN, TIBC, IRON, RETICCTPCT in the last 72 hours. Sepsis Labs: Recent Labs  Lab 10/26/17 0323 10/30/17 2320 10/31/17 0145  LATICACIDVEN 1.0 1.0 1.1    Recent Results (from the past 240 hour(s))  Culture, blood (routine x 2)     Status: None   Collection Time: 10/24/17 12:06 AM  Result Value Ref Range Status   Specimen Description BLOOD LEFT FOOT  Final   Special Requests   Final    BOTTLES DRAWN AEROBIC AND ANAEROBIC Blood Culture adequate volume   Culture NO GROWTH 5 DAYS  Final   Report Status 10/29/2017 FINAL  Final  Culture, blood (routine x 2)     Status: None   Collection Time: 10/24/17 12:16 AM  Result Value Ref Range Status   Specimen Description BLOOD RIGHT FOOT  Final   Special Requests   Final    BOTTLES DRAWN AEROBIC ONLY Blood Culture results may not be optimal due to an inadequate volume of blood received in culture bottles   Culture NO GROWTH 5 DAYS  Final   Report Status 10/29/2017 FINAL  Final  Fungus culture, blood     Status: None (Preliminary result)   Collection Time: 10/24/17 12:16 AM  Result Value Ref Range Status   Specimen Description BLOOD LEFT FOOT  Final   Special Requests   Final    BOTTLES DRAWN AEROBIC AND ANAEROBIC Blood Culture adequate volume   Culture NO GROWTH 6 DAYS  Final   Report Status PENDING  Incomplete  Culture, blood (routine x 2)     Status: None   Collection  Time: 10/24/17  3:50 PM  Result Value Ref Range Status   Specimen Description BLOOD RIGHT FOOT  Final   Special Requests IN PEDIATRIC BOTTLE Blood Culture adequate volume  Final   Culture NO GROWTH 5 DAYS  Final   Report Status 10/29/2017 FINAL  Final  Culture, blood (routine x 2)     Status: None (Preliminary result)   Collection Time: 10/27/17  1:19 PM  Result Value Ref Range Status   Specimen Description BLOOD RIGHT HAND  Final   Special Requests IN PEDIATRIC BOTTLE Blood Culture adequate volume  Final   Culture NO GROWTH 3 DAYS  Final   Report Status PENDING  Incomplete  Culture, blood (routine x 2)     Status: None (Preliminary result)   Collection Time: 10/27/17  1:24 PM  Result Value Ref Range Status   Specimen Description BLOOD RIGHT HAND  Final   Special Requests IN PEDIATRIC BOTTLE Blood Culture adequate volume  Final   Culture NO GROWTH 3 DAYS  Final   Report Status PENDING  Incomplete  Culture, Urine     Status: None   Collection Time: 10/27/17  4:44 PM  Result Value Ref Range Status   Specimen Description URINE, RANDOM  Final   Special Requests NONE  Final   Culture NO GROWTH  Final   Report Status 10/28/2017 FINAL  Final  Aerobic/Anaerobic Culture (surgical/deep wound)     Status: None (Preliminary result)   Collection Time: 10/29/17  4:17 PM  Result Value Ref Range Status   Specimen Description ABSCESS  Final   Special Requests ABDOMEN  Final   Gram Stain   Final    ABUNDANT WBC PRESENT, PREDOMINANTLY PMN RARE GRAM POSITIVE COCCI IN PAIRS RARE BUDDING YEAST SEEN    Culture NO GROWTH 1 DAY  Final   Report Status  PENDING  Incomplete  C difficile quick scan w PCR reflex     Status: None   Collection Time: 10/29/17  4:25 PM  Result Value Ref Range Status   C Diff antigen NEGATIVE NEGATIVE Final   C Diff toxin NEGATIVE NEGATIVE Final   C Diff interpretation No C. difficile detected.  Final         Radiology Studies: [place] Chest (pleural Effusion)  Result  Date: 10/31/2017 CLINICAL DATA:  71 year old female with referral for thoracentesis. EXAM: CHEST ULTRASOUND COMPARISON:  10/28/2017 FINDINGS: Ultrasound survey demonstrates small fluid at the right lung base. Thoracentesis deferred at this time. IMPRESSION: Ultrasound survey demonstrates small fluid at the right lung base. Electronically Signed   By: Corrie Mckusick D.O.   On: 10/31/2017 10:51   Ct Image Guided Drainage By Percutaneous Catheter  Result Date: 10/29/2017 INDICATION: 71 year old with a persistent anterior abdominal fluid collection near the falciform ligament of the liver. This fluid collection has been present for quite a while but the patient continues to have fevers. Complex medical history including Crohn's disease and abdominal abscess collections. EXAM: CT GUIDED DRAINAGE OF ANTERIOR ABDOMINAL ABSCESS MEDICATIONS: None ANESTHESIA/SEDATION: 4 mg IV Versed 20 mcg IV Fentanyl Moderate Sedation Time:  23 minutes The patient was continuously monitored during the procedure by the interventional radiology nurse under my direct supervision. COMPLICATIONS: None immediate. TECHNIQUE: Informed written consent was obtained from the patient after a thorough discussion of the procedural risks, benefits and alternatives. All questions were addressed. Maximal Sterile Barrier Technique was utilized including caps, mask, sterile gowns, sterile gloves, sterile drape, hand hygiene and skin antiseptic. A timeout was performed prior to the initiation of the procedure. PROCEDURE: Patient was placed supine in the CT scanner. CT images through the anterior abdomen were obtained. Anterior abdomen was prepped with chlorhexidine. Sterile field was created. Skin was anesthetized with 1% lidocaine. Using CT guidance, 18 gauge trocar needle was directed into the small fluid collection along the inferior left hepatic lobe. Thick brown fluid was aspirated. A stiff Amplatz wire was advanced into the collection. The tract was  dilated to accommodate a 10.2 [place] multipurpose drain. Catheter was attached to a suction bulb. Catheter was sutured to skin. FINDINGS: Small fluid collection along the inferior aspect of the left hepatic lobe. Thick brown fluid was aspirated from the collection. 10 French drain was placed. IMPRESSION: CT-guided placement of a drainage catheter within the small fluid collection along the inferior aspect of the left hepatic lobe. Fluid was sent for culture. Electronically Signed   By: Markus Daft M.D.   On: 10/29/2017 17:36        Scheduled Meds: . acetaminophen  1,000 mg Oral Q6H   Or  . acetaminophen (TYLENOL) oral liquid 160 mg/5 mL  1,000 mg Oral Q6H  . feeding supplement (PRO-STAT SUGAR FREE 64)  30 mL Oral BID  . fentaNYL   Intravenous Q4H  . pantoprazole  40 mg Intravenous Q12H  . promethazine  6.25 mg Intravenous TID AC  . sodium chloride flush  5 mL Intravenous Q8H   Continuous Infusions: . acetaminophen 1,000 mg (10/31/17 1301)  . anidulafungin Stopped (10/30/17 1848)  . dextrose 10 mL/hr at 10/31/17 0700  . linezolid (ZYVOX) IV 600 mg (10/31/17 1243)  . meropenem (MERREM) IV 1 g (10/31/17 1302)  . potassium chloride Stopped (10/22/17 1145)  . TPN ADULT (ION)       LOS: 17 days    Time spent: 35 minutes.  Elmarie Shiley, MD [facility] Pager [phone]  If 7PM-7AM, please contact night-coverage [email or web address] Password [email or web address] 10/31/2017, 1:26 PM

## 2017-10-31 NOTE — Progress Notes (Signed)
Dr. Myna Hidalgo paged about pt's temp of 104.4. New orders received. Will continue to monitor.

## 2017-10-31 NOTE — Progress Notes (Signed)
Patient seen. As mentioned in previous GI Notes, at this time from a GI standpoint she is not a candidate for steroids or biologic for her Crohn's disease due to current medical status and ongoing infection and abscesses. We will sign off. Call us if needed.

## 2017-11-01 DIAGNOSIS — L0291 Cutaneous abscess, unspecified: Secondary | ICD-10-CM

## 2017-11-01 LAB — GLUCOSE, CAPILLARY
GLUCOSE-CAPILLARY: 115 mg/dL — AB (ref 65–99)
Glucose-Capillary: 84 mg/dL (ref 65–99)
Glucose-Capillary: 94 mg/dL (ref 65–99)
Glucose-Capillary: 101 mg/dL — ABNORMAL HIGH (ref 65–99)

## 2017-11-01 LAB — CULTURE, BLOOD (ROUTINE X 2)
CULTURE: NO GROWTH
CULTURE: NO GROWTH
SPECIAL REQUESTS: ADEQUATE
Special Requests: ADEQUATE

## 2017-11-01 LAB — DIFFERENTIAL
BASOS ABS: 0.1 10*3/uL (ref 0.0–0.1)
Basophils Relative: 0 %
EOS ABS: 0.3 10*3/uL (ref 0.0–0.7)
EOS PCT: 1 %
Lymphocytes Relative: 17 %
Lymphs Abs: 3.6 10*3/uL (ref 0.7–4.0)
Monocytes Absolute: 2.3 10*3/uL — ABNORMAL HIGH (ref 0.1–1.0)
Monocytes Relative: 11 %
NEUTROS PCT: 71 %
Neutro Abs: 14.5 10*3/uL — ABNORMAL HIGH (ref 1.7–7.7)

## 2017-11-01 LAB — COMPREHENSIVE METABOLIC PANEL
ALT: 37 U/L (ref 14–54)
AST: 52 U/L — AB (ref 15–41)
Albumin: 1.5 g/dL — ABNORMAL LOW (ref 3.5–5.0)
Alkaline Phosphatase: 149 U/L — ABNORMAL HIGH (ref 38–126)
Anion gap: 6 (ref 5–15)
BILIRUBIN TOTAL: 0.5 mg/dL (ref 0.3–1.2)
BUN: 22 mg/dL — AB (ref 6–20)
CHLORIDE: 108 mmol/L (ref 101–111)
CO2: 19 mmol/L — ABNORMAL LOW (ref 22–32)
CREATININE: 0.87 mg/dL (ref 0.44–1.00)
Calcium: 8.2 mg/dL — ABNORMAL LOW (ref 8.9–10.3)
Glucose, Bld: 109 mg/dL — ABNORMAL HIGH (ref 65–99)
POTASSIUM: 3.7 mmol/L (ref 3.5–5.1)
Sodium: 133 mmol/L — ABNORMAL LOW (ref 135–145)
TOTAL PROTEIN: 7.2 g/dL (ref 6.5–8.1)

## 2017-11-01 LAB — PREALBUMIN: PREALBUMIN: 8 mg/dL — AB (ref 18–38)

## 2017-11-01 LAB — CBC
HCT: 27.5 % — ABNORMAL LOW (ref 36.0–46.0)
Hemoglobin: 9.1 g/dL — ABNORMAL LOW (ref 12.0–15.0)
MCH: 28 pg (ref 26.0–34.0)
MCHC: 33.1 g/dL (ref 30.0–36.0)
MCV: 84.6 fL (ref 78.0–100.0)
PLATELETS: 331 10*3/uL (ref 150–400)
RBC: 3.25 MIL/uL — AB (ref 3.87–5.11)
RDW: 18.9 % — ABNORMAL HIGH (ref 11.5–15.5)
WBC: 20.7 10*3/uL — ABNORMAL HIGH (ref 4.0–10.5)

## 2017-11-01 LAB — PHOSPHORUS: PHOSPHORUS: 4.6 mg/dL (ref 2.5–4.6)

## 2017-11-01 LAB — TRIGLYCERIDES: TRIGLYCERIDES: 117 mg/dL (ref <150)

## 2017-11-01 LAB — MAGNESIUM: MAGNESIUM: 2 mg/dL (ref 1.7–2.4)

## 2017-11-01 MED ORDER — METOCLOPRAMIDE HCL 5 MG/ML IJ SOLN
5.0000 mg | Freq: Four times a day (QID) | INTRAMUSCULAR | Status: DC
  Administered 2017-11-01 – 2017-11-04 (×13): 5 mg via INTRAVENOUS
  Filled 2017-11-01 (×13): qty 2

## 2017-11-01 MED ORDER — TRAVASOL 10 % IV SOLN
INTRAVENOUS | Status: AC
  Administered 2017-11-01: 18:00:00 via INTRAVENOUS
  Filled 2017-11-01: qty 840

## 2017-11-01 MED ORDER — ALTEPLASE 2 MG IJ SOLR
2.0000 mg | Freq: Once | INTRAMUSCULAR | Status: AC
  Administered 2017-11-01: 2 mg

## 2017-11-01 MED ORDER — BOOST / RESOURCE BREEZE PO LIQD
1.0000 | Freq: Two times a day (BID) | ORAL | Status: DC
  Administered 2017-11-01 – 2017-11-03 (×5): 1 via ORAL

## 2017-11-01 MED ORDER — MEGESTROL ACETATE 400 MG/10ML PO SUSP
400.0000 mg | Freq: Two times a day (BID) | ORAL | Status: DC
  Administered 2017-11-01 – 2017-11-02 (×4): 400 mg via ORAL
  Filled 2017-11-01 (×5): qty 10

## 2017-11-01 NOTE — Progress Notes (Signed)
11 Days Post-Op   Subjective/Chief Complaint: Pt with some nausea still this AM Fevers cont   Objective: Vital signs in last 24 hours: Temp:  [98.9 F (37.2 C)-104.3 F (40.2 C)] 101.9 F (38.8 C) (11/26 0739) Pulse Rate:  [85-124] 103 (11/26 0329) Resp:  [15-35] 33 (11/26 0329) BP: (107-152)/(66-74) 116/67 (11/26 0329) SpO2:  [95 %-100 %] 96 % (11/26 0739) Last BM Date: 10/31/17  Intake/Output from previous day: 11/25 0701 - 11/26 0700 In: 2442.8 [I.V.:1307.8; IV Piggyback:1130] Out: 2839 [Urine:2800; Drains:15; Chest Tube:24] Intake/Output this shift: Total I/O In: -  Out: 500 [Urine:500]  General appearance: alert and cooperative Resp: clear to auscultation bilaterally Cardio: regular rate and rhythm, S1, S2 normal, no murmur, click, rub or gallop GI: soft, non-tender; bowel sounds normal; no masses,  no organomegaly and JP min, Ss  Lab Results:  Recent Labs    10/31/17 0429 11/01/17 0230  WBC 34.8* 20.7*  HGB 9.8* 9.1*  HCT 30.7* 27.5*  PLT 364 331   BMET Recent Labs    10/30/17 2156 11/01/17 0230  NA 131* 133*  K 5.1 3.7  CL 107 108  CO2 17* 19*  GLUCOSE 129* 109*  BUN 18 22*  CREATININE 0.95 0.87  CALCIUM 8.5* 8.2*   PT/INR Recent Labs    10/29/17 1115  LABPROT 15.5*  INR 1.24   ABG No results for input(s): PHART, HCO3 in the last 72 hours.  Invalid input(s): PCO2, PO2  Studies/Results: [place] Chest (pleural Effusion)  Result Date: 10/31/2017 CLINICAL DATA:  71 year old female with referral for thoracentesis. EXAM: CHEST ULTRASOUND COMPARISON:  10/28/2017 FINDINGS: Ultrasound survey demonstrates small fluid at the right lung base. Thoracentesis deferred at this time. IMPRESSION: Ultrasound survey demonstrates small fluid at the right lung base. Electronically Signed   By: Corrie Mckusick D.O.   On: 10/31/2017 10:51    Anti-infectives: Anti-infectives (From admission, onward)   Start     Dose/Rate Route Frequency Ordered Stop   10/31/17  1215  linezolid (ZYVOX) IVPB 600 mg     600 mg 300 mL/hr over 60 Minutes Intravenous Every 12 hours 10/31/17 1202     10/27/17 1400  meropenem (MERREM) 1 g in sodium chloride 0.9 % 100 mL IVPB     1 g 200 mL/hr over 30 Minutes Intravenous Every 8 hours 10/27/17 1245     10/27/17 1100  levofloxacin (LEVAQUIN) IVPB 750 mg  Status:  Discontinued     750 mg 100 mL/hr over 90 Minutes Intravenous Every 24 hours 10/27/17 1041 10/27/17 1218   10/27/17 0400  piperacillin-tazobactam (ZOSYN) IVPB 3.375 g  Status:  Discontinued     3.375 g 12.5 mL/hr over 240 Minutes Intravenous Every 8 hours 10/26/17 2201 10/27/17 1222   10/22/17 0600  vancomycin (VANCOCIN) IVPB 1000 mg/200 mL premix  Status:  Discontinued     1,000 mg 200 mL/hr over 60 Minutes Intravenous To Short Stay 10/28/2017 1631 11/01/2017 1637   10/25/2017 2100  vancomycin (VANCOCIN) IVPB 1000 mg/200 mL premix     1,000 mg 200 mL/hr over 60 Minutes Intravenous Every 12 hours 10/13/2017 1631 10/13/2017 2246   10/29/2017 0900  vancomycin (VANCOCIN) IVPB 1000 mg/200 mL premix     1,000 mg 200 mL/hr over 60 Minutes Intravenous To Surgery 10/27/2017 0857 11/04/2017 0957   10/17/17 0800  vancomycin (VANCOCIN) IVPB 750 mg/150 ml premix  Status:  Discontinued     750 mg 150 mL/hr over 60 Minutes Intravenous Every 24 hours 10/16/17 1220 10/17/17 1023  10/16/17 1800  anidulafungin (ERAXIS) 100 mg in sodium chloride 0.9 % 100 mL IVPB     100 mg 78 mL/hr over 100 Minutes Intravenous Every 24 hours 10/15/17 1657     10/16/17 0600  vancomycin (VANCOCIN) IVPB 1000 mg/200 mL premix  Status:  Discontinued     1,000 mg 200 mL/hr over 60 Minutes Intravenous Every 36 hours 10/22/2017 2308 10/16/17 1220   10/15/17 1800  anidulafungin (ERAXIS) 200 mg in sodium chloride 0.9 % 200 mL IVPB     200 mg 78 mL/hr over 200 Minutes Intravenous  Once 10/15/17 1657 10/15/17 2148   10/26/2017 2330  piperacillin-tazobactam (ZOSYN) IVPB 3.375 g  Status:  Discontinued     3.375 g 100 mL/hr  over 30 Minutes Intravenous Every 8 hours 10/25/2017 2257 10/22/2017 2259   11/01/2017 2315  piperacillin-tazobactam (ZOSYN) IVPB 3.375 g  Status:  Discontinued     3.375 g 12.5 mL/hr over 240 Minutes Intravenous Every 8 hours 11/04/2017 2300 10/26/17 2201   10/25/2017 1930  vancomycin (VANCOCIN) IVPB 1000 mg/200 mL premix     1,000 mg 200 mL/hr over 60 Minutes Intravenous  Once 10/15/2017 1915 10/10/2017 2044   10/10/2017 1530  piperacillin-tazobactam (ZOSYN) IVPB 3.375 g     3.375 g 100 mL/hr over 30 Minutes Intravenous  Once 10/07/2017 1529 10/23/2017 1604      Assessment/Plan: s/p Procedure(s): LEFT VIDEO ASSISTED THORACOSCOPY (VATS) for MEDISTINAL DRAINAGE (Left) Will add megace and reglan to regimen  Encourage PO Mobilize Cont' abx per ID  LOS: 18 days    [name]., Anne Hahn 11/01/2017

## 2017-11-01 NOTE — Progress Notes (Signed)
[place] NOTE   Pharmacy Consult for TPN Indication: Poor PO intake / unable to place repeat G-tube   Patient Measurements: Height: 5' 4.5" (163.8 cm) Weight: 123 lb 0.3 oz (55.8 kg) IBW/kg (Calculated) : 55.85 TPN AdjBW (KG): 59 Body mass index is 20.79 kg/m.  Assessment:  68 yoF re-admit 11/8 with N/V, loose stools (baseline w/Crohn's). Was hospitalized for a month in October and developed fungemia and treated with Invanz and anidulafungin. Re-admitted a week later with intermittent fevers and difficultly swallowing. CT chest showed recurrent mediastinal fluid abscess.  PMH: Crohns, HH, hx SBO  Significant events:  11/15 Left VATs to drain mediastinum  11/23 IR guided perihepatic abscess drain placement   GI: Mediastinal abscess complex, had been advanced to full liquid diet but N/V with almost anything PO - nausea continuing. Clear liquids for now. Phenergan low dose d/t Qtc TID and monitoring for each dose. Avoiding Zofran. Prn Compazine. Patient refuses feeding tube. Attempting full liquids again 11/25 (poor appetite reported). Adding megace/reglan to regimen 11/26. GOC being assessed daily. Albumin low at 1.5, Pre-albumin low at 8. Last BM 11/25  Endo: CBGs remain on lower end on continuous TPN, 84-115. Sliding scale insulin stopped on 11/21. D5 at 30m/hr Insulin requirements past 24 hours: 0 units regular insulin  Lytes: Na up 133, Cl wnl, CO2 up 19. K down to 3.7 after decreasing potassium in TPN. Phos up 4.6, CoCa 10.2. Mag wnl Renal: SCr 0.87 stable, BUN up 22. UOP 2.191mkg/hr. Net positive +1.5L since admit Neuro: PCA - pain score 10 Pulm: RA; Bilateral chest tube output at 20 mL / 24 hours, chest tubes to remain in place for several days. R-effusion larger 11/23 - not significant fluid to remove Cards: BP soft-wnl, ST 120s Anticoag: RUE DVT - holding heparin due to hematemesis Hepatobil: AST mildly elevated, ALT and Tbili wnl.  Alk phos up 149. Trig 83>117 ID: Merrem + Anidulafungin for mediastinal abscess. 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 Rare budding yeast, GPC in pairs on Gstain of 11/23 abd abscess cx. Tmax/24h 104.3, WBC down to 20.7. Drain in place. Cdiff negative. 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 Repeat blood cultures sent 11/25 11/9 Anidulafungin >> 11/21 Meropenem >> 11/25 Zyvox >>  Best Practices: SCDs, PPI IV TPN Access: PICC 11/11>> TPN start date: 11/11>>  Nutritional Goals (per RD recommendation on 11/19): Kcal:  1500-1700 Protein:  75-90 grams  Fluid:  >/= 1.5 L/day  Current Nutrition:  Clear liquid (appetite poor)>> attempting full liquids 11/24 Custom TPN   Plan:  Continue continuous TPN at 70 mL/hr over 24 hr due to hypoglycemia and worsening clinical status.  TPN provides 84 g of protein, 235 g of dextrose, and 50 g lipids providing 1,640 total kcal, which meets ~100% of patient needs.  Electrolytes in TPN:  decrease phosphate, continue max acetate AddMVI,trace elements in TPN Continue CBG checks for hypoglycemic events Monitor TPN labs, clinical progress, Qtc F/U ability to tolerate PO diet, GOC    HaElicia LampPharmD, BCPS Clinical Pharmacist Rx Phone # for today: #[phone] 3:30PM, please call Main Rx: #[phone]/26/2018 7:25 AM

## 2017-11-01 NOTE — Consult Note (Signed)
[place] Nurse wound consult note Reason for Consult: Sacrum and buttocks Follow up today, [place] nurse was not able to see patient on Friday due to patient be off the floor. Today I have discussed with bedside nurse Arbie Cookey.  Patient has MASD (moisture associated skin damage)  Buttocks are red but blanchable.  Continue routine skin care with barrier cream and prophylatic foam dressing.  Patient on air mattress as well.  Skin care order set implemented appropriately. Will not follow.  Re consult if needed, will not follow at this time. Thanks  Dezmon Conover [employer], RN,CWOCN, CNS, [place] [phone])

## 2017-11-01 NOTE — Progress Notes (Signed)
Referring Physician(s): [place] Physician: Markus Daft  Patient Status:  [facility] - In-pt  Chief Complaint:  Abdominal abscess  Subjective:  S/P type IV hiatal hernia repair on 81/4 complicated by fungemia, abdominal abscess, and mediastinal abscess.  She is s/p VATS on 11/15. She is S/P drain placement by Dr. Anselm Pancoast 10/29/2017.  She has no complaints today.  Allergies: Zithromax [azithromycin]; Aspirin; Ibuprofen; Iron; and Sulfa antibiotics  Medications: Prior to Admission medications   Medication Sig Start Date End Date Taking? Authorizing Provider  albuterol (PROVENTIL HFA;VENTOLIN HFA) 108 (90 Base) MCG/ACT inhaler Inhale 2 puffs into the lungs every 6 (six) hours as needed for wheezing. 12/09/15  Yes Rama, Venetia Maxon, MD  apixaban (ELIQUIS) 5 MG TABS tablet Take 10 mg twice a day for 7 days and then start using 5 mg BID. 10/07/17  Yes Barton Dubois, MD  cyanocobalamin (,VITAMIN B-12,) 1000 MCG/ML injection INJECT 1000 MCG IM ONCE MONTHLY 07/09/17  Yes Rehman, Mechele Dawley, MD  dicyclomine (BENTYL) 10 MG/5ML syrup TAKE 10 MLS (20 MG TOTAL) BY MOUTH 3 (THREE) TIMES DAILY AS NEEDED. Patient taking differently: Take 20 mg 3 (three) times daily as needed by mouth (stomach cramps).  07/26/17  Yes Rehman, Mechele Dawley, MD  diphenoxylate-atropine (LOMOTIL) 2.5-0.025 MG tablet TAKE 1 TABLET BY MOUTH FOUR TIMES A DAY AS NEEDED Patient taking differently: Take one tablet twice daily. 06/28/17  Yes Setzer, Terri L, NP  feeding supplement (BOOST / RESOURCE BREEZE) LIQD Take 1 Container by mouth 3 (three) times daily between meals. 10/07/17  Yes Barton Dubois, MD  furosemide (LASIX) 20 MG tablet Take 1 tablet (20 mg total) by mouth daily as needed (Take for weight gain of more than 2 pounds in a day or 5 pounds in 2 days.). 06/16/17  Yes Lavina Hamman, MD  HYDROcodone-acetaminophen (NORCO) 7.5-325 MG tablet Take 1 tablet by mouth every 6 (six) hours as needed for moderate pain or severe  pain. 09/26/17  Yes Michael Boston, MD  magnesium oxide (MAG-OX) 400 (241.3 Mg) MG tablet Take 1 tablet (400 mg total) by mouth daily. 10/07/17  Yes Barton Dubois, MD  nystatin (NYSTATIN) powder Apply 1 g topically 2 (two) times daily as needed (for itching/irritation).   Yes [provider]  pantoprazole (PROTONIX) 40 MG tablet Take 1 tablet (40 mg total) by mouth daily. 06/16/17  Yes Nita Sells, MD  prochlorperazine (COMPAZINE) 10 MG tablet Take 1 tablet (10 mg total) by mouth every 6 (six) hours as needed for refractory nausea / vomiting. 10/07/17  Yes Barton Dubois, MD  sodium bicarbonate 650 MG tablet Take 650 mg by mouth 2 (two) times daily.   Yes [provider]  spironolactone (ALDACTONE) 50 MG tablet Take 50 mg by mouth daily.  06/23/17  Yes [provider]  acetaminophen (TYLENOL) 325 MG tablet Take 2 tablets (650 mg total) by mouth every 6 (six) hours as needed for mild pain or moderate pain. Patient not taking: Reported on 10/13/2017 10/07/17   Barton Dubois, MD  promethazine (PHENERGAN) 25 MG tablet TAKE 1 TABLET (25 MG TOTAL) BY MOUTH 2 (TWO) TIMES DAILY AS NEEDED FOR NAUSEA OR VOMITING. Patient not taking: Reported on 10/08/2017 09/29/17   Rogene Houston, MD     Vital Signs: BP 111/75 (BP Location: Right Leg)   Pulse (!) 103   Temp 97.9 F (36.6 C) (Rectal)   Resp (!) 28   Ht 5' 4.5" (1.638 m)   Wt 123 lb 0.3  oz (55.8 kg)   SpO2 100%   BMI 20.79 kg/m   Physical Exam Sleeping but awakens to voice. Abdomen soft, non-tender Drain in place. ~15 mL blood tinged output.  Imaging: [place] Chest (pleural Effusion)  Result Date: 10/31/2017 CLINICAL DATA:  71 year old female with referral for thoracentesis. EXAM: CHEST ULTRASOUND COMPARISON:  10/28/2017 FINDINGS: Ultrasound survey demonstrates small fluid at the right lung base. Thoracentesis deferred at this time. IMPRESSION: Ultrasound survey demonstrates small fluid at the right lung base.  Electronically Signed   By: Corrie Mckusick D.O.   On: 10/31/2017 10:51   Ct Image Guided Drainage By Percutaneous Catheter  Result Date: 10/29/2017 INDICATION: 71 year old with a persistent anterior abdominal fluid collection near the falciform ligament of the liver. This fluid collection has been present for quite a while but the patient continues to have fevers. Complex medical history including Crohn's disease and abdominal abscess collections. EXAM: CT GUIDED DRAINAGE OF ANTERIOR ABDOMINAL ABSCESS MEDICATIONS: None ANESTHESIA/SEDATION: 4 mg IV Versed 20 mcg IV Fentanyl Moderate Sedation Time:  23 minutes The patient was continuously monitored during the procedure by the interventional radiology nurse under my direct supervision. COMPLICATIONS: None immediate. TECHNIQUE: Informed written consent was obtained from the patient after a thorough discussion of the procedural risks, benefits and alternatives. All questions were addressed. Maximal Sterile Barrier Technique was utilized including caps, mask, sterile gowns, sterile gloves, sterile drape, hand hygiene and skin antiseptic. A timeout was performed prior to the initiation of the procedure. PROCEDURE: Patient was placed supine in the CT scanner. CT images through the anterior abdomen were obtained. Anterior abdomen was prepped with chlorhexidine. Sterile field was created. Skin was anesthetized with 1% lidocaine. Using CT guidance, 18 gauge trocar needle was directed into the small fluid collection along the inferior left hepatic lobe. Thick brown fluid was aspirated. A stiff Amplatz wire was advanced into the collection. The tract was dilated to accommodate a 10.2 [place] multipurpose drain. Catheter was attached to a suction bulb. Catheter was sutured to skin. FINDINGS: Small fluid collection along the inferior aspect of the left hepatic lobe. Thick brown fluid was aspirated from the collection. 10 French drain was placed. IMPRESSION: CT-guided placement  of a drainage catheter within the small fluid collection along the inferior aspect of the left hepatic lobe. Fluid was sent for culture. Electronically Signed   By: Markus Daft M.D.   On: 10/29/2017 17:36    Labs:  CBC: Recent Labs    10/29/17 0315 10/30/17 0845 10/31/17 0429 11/01/17 0230  WBC 20.6* 24.1* 34.8* 20.7*  HGB 8.9* 10.1* 9.8* 9.1*  HCT 28.0* 31.2* 30.7* 27.5*  PLT 286 361 364 331    COAGS: Recent Labs    05/07/17 0244 09/17/17 1021 10/28/2017 1405 10/15/17 0400 10/29/17 1115  INR  --  1.24 1.69 1.52 1.24  APTT 21*  --   --   --   --     BMP: Recent Labs    10/29/17 0315 10/30/17 0845 10/30/17 2156 11/01/17 0230  NA 135 132* 131* 133*  K 4.6 5.5* 5.1 3.7  CL 106 105 107 108  CO2 22 19* 17* 19*  GLUCOSE 109* 83 129* 109*  BUN 18 21* 18 22*  CALCIUM 8.2* 8.4* 8.5* 8.2*  CREATININE 0.88 0.86 0.95 0.87  GFRNONAA >60 >60 60* >60  GFRAA >60 >60 >60 >60    LIVER FUNCTION TESTS: Recent Labs    10/23/17 0513 10/25/17 0005 10/28/17 0500 11/01/17 0230  BILITOT 0.4 0.3 0.3 0.5  AST 28 32 21 52*  ALT 27 28 16  37  ALKPHOS 111 145* 120 149*  PROT 6.9 6.0* 6.0* 7.2  ALBUMIN 1.6* 1.5* 1.3* 1.5*    Assessment and Plan:  Anterior abdominal abscess S/P Drain placement by Dr. Anselm Pancoast 10/29/2017.  Continue routine drain care.  Output seems to be diminishing and is no longer brown/purulent in appearance.  Will discuss with Dr. Anselm Pancoast regarding obtaining CT scan to evaluate abscess.  Electronically Signed: Murrell Redden, PA-C 11/01/2017, 4:05 PM   I spent a total of 15 Minutes at the the patient's bedside AND on the patient's hospital floor or unit, greater than 50% of which was counseling/coordinating care for f/u abdominal drain.

## 2017-11-01 NOTE — Progress Notes (Signed)
[address]       [place] [address]             [phone]      11 Days Post-Op Procedure(s) (LRB): LEFT VIDEO ASSISTED THORACOSCOPY (VATS) for MEDISTINAL DRAINAGE (Left) Subjective: Looks a lot more spry today, says she's feeling better  Objective: Vital signs in last 24 hours: Temp:  [97.9 F (36.6 C)-103.3 F (39.6 C)] 97.9 F (36.6 C) (11/26 1213) Pulse Rate:  [103-124] 103 (11/26 1112) Cardiac Rhythm: Sinus tachycardia (11/26 0848) Resp:  [15-39] 28 (11/26 1200) BP: (107-153)/(66-80) 111/75 (11/26 1112) SpO2:  [95 %-100 %] 100 % (11/26 1200)  Hemodynamic parameters for last 24 hours:    Intake/Output from previous day: 11/25 0701 - 11/26 0700 In: 2442.8 [I.V.:1307.8; IV Piggyback:1130] Out: 2839 [Urine:2800; Drains:15; Chest Tube:24] Intake/Output this shift: Total I/O In: 930 [I.V.:420; Other:10; IV Piggyback:500] Out: 1203 [Urine:700; Drains:3; Stool:500]  General appearance: alert, cooperative, fatigued and no distress Heart: regular rate and rhythm and tachy Lungs: dim in bases Abdomen: soft, non-tender Extremities: no edema or calf tenderness Wound: incis healing well   Lab Results: Recent Labs    10/31/17 0429 11/01/17 0230  WBC 34.8* 20.7*  HGB 9.8* 9.1*  HCT 30.7* 27.5*  PLT 364 331   BMET:  Recent Labs    10/30/17 2156 11/01/17 0230  NA 131* 133*  K 5.1 3.7  CL 107 108  CO2 17* 19*  GLUCOSE 129* 109*  BUN 18 22*  CREATININE 0.95 0.87  CALCIUM 8.5* 8.2*    PT/INR: No results for input(s): LABPROT, INR in the last 72 hours. ABG    Component Value Date/Time   PHART 7.317 (L) 10/22/2017 0420   HCO3 17.9 (L) 10/22/2017 0420   TCO2 15 (L) 09/10/2017 1214   ACIDBASEDEF 7.1 (H) 10/22/2017 0420   O2SAT 96.1 10/22/2017 0420   CBG (last 3)  Recent Labs    10/31/17 1351 11/01/17 0010 11/01/17 0739  GLUCAP 112* 115* 84    Meds Scheduled Meds: . chlorhexidine  15 mL Mouth Rinse BID  . feeding  supplement  1 Container Oral BID BM  . fentaNYL   Intravenous Q4H  . mouth rinse  15 mL Mouth Rinse q12n4p  . megestrol  400 mg Oral BID  . metoCLOPramide (REGLAN) injection  5 mg Intravenous Q6H  . pantoprazole  40 mg Intravenous Q12H  . promethazine  6.25 mg Intravenous TID AC  . sodium chloride flush  5 mL Intravenous Q8H   Continuous Infusions: . acetaminophen Stopped (11/01/17 0823)  . anidulafungin 100 mg (10/31/17 1715)  . linezolid (ZYVOX) IV Stopped (11/01/17 0930)  . meropenem (MERREM) IV Stopped (11/01/17 0602)  . potassium chloride Stopped (10/22/17 1145)  . TPN ADULT (ION) 70 mL/hr at 11/01/17 0400  . TPN ADULT (ION)     PRN Meds:.acetaminophen, diphenhydrAMINE **OR** diphenhydrAMINE, fentaNYL (SUBLIMAZE) injection, hydrocortisone, lip balm, liver oil-zinc oxide, naloxone **AND** sodium chloride flush, naphazoline-glycerin, nystatin, oxyCODONE, phenol, potassium chloride, prochlorperazine, promethazine, sodium chloride flush, traMADol  Xrays [place] Chest (pleural Effusion)  Result Date: 10/31/2017 CLINICAL DATA:  71 year old female with referral for thoracentesis. EXAM: CHEST ULTRASOUND COMPARISON:  10/28/2017 FINDINGS: Ultrasound survey demonstrates small fluid at the right lung base. Thoracentesis deferred at this time. IMPRESSION: Ultrasound survey demonstrates small fluid at the right lung base. Electronically Signed   By: Corrie Mckusick D.O.   On: 10/31/2017 10:51    Assessment/Plan: S/P Procedure(s) (LRB): LEFT VIDEO ASSISTED THORACOSCOPY (  VATS) for MEDISTINAL DRAINAGE (Left)  1 conts with fevers but leukocytosis is improving 2 cont chest tube for now but may be able to remove in next couple days 3 plan per medical/general surgery teams  LOS: 18 days    [name] 11/01/2017

## 2017-11-01 NOTE — Progress Notes (Signed)
[name] for Infectious Disease    Date of Admission:  11/02/2017   Total days of antibiotics 17        Day 17 anidula        Day 6 meropenem        Day 2 linezolid                      Danielle [name] is a 71 y.o. female with type IV hiatal hernia repair on 17/4 complicated by fungemia, pelvic abscess as well as mediastinal abscess s/p VATS on 11/15, currently maintained on TPN Principal Problem:   Septic shock ([place]) Active Problems:   Leukocytosis   Perianal Crohn's disease, with fistula ([place])   Acute renal failure superimposed on stage 3 chronic kidney disease ([place])   Hiatal hernia   Immunosuppressed status ([place])   Chest tube in place   Hypotension   Vomiting in adult patient   S/P thoracentesis   Anasarca   DVT (deep venous thrombosis) ([place])   Protein-calorie malnutrition, moderate (H[facility])   Mediastinal abscess (H[facility])   Mediastinal post-op seromas   Pleural effusion, left   Patient's noncompliance with intervention / medical advice    Dehydration   Acute embolism and thrombosis of left axillary vein (H[facility])   Goals of care, counseling/discussion   Palliative care encounter   Fever   Intra-abdominal infection   Hemoptysis   Abscess   Intractable vomiting with nausea    Subjective: Remains to have ongoing fevers as high as 104.3 yesterday. She is feeling slightly better, less nausea. She is tired of being under cooling blanket. Did nto receive as much anti pyretic yesterday  - started on linezolid yesterday, WBC trending down     Medications:  . chlorhexidine  15 mL Mouth Rinse BID  . feeding supplement  1 Container Oral BID BM  . fentaNYL   Intravenous Q4H  . mouth rinse  15 mL Mouth Rinse q12n4p  . megestrol  400 mg Oral BID  . metoCLOPramide (REGLAN) injection  5 mg Intravenous Q6H  . pantoprazole  40 mg Intravenous Q12H  . promethazine  6.25 mg Intravenous TID AC  . sodium chloride flush  5 mL Intravenous Q8H    Objective: Vital signs in  last 24 hours: Temp:  [97.9 F (36.6 C)-103.3 F (39.6 C)] 97.9 F (36.6 C) (11/26 1213) Pulse Rate:  [103-124] 103 (11/26 1112) Resp:  [15-39] 28 (11/26 1200) BP: (107-153)/(66-80) 111/75 (11/26 1112) SpO2:  [95 %-100 %] 100 % (11/26 1200) Constitutional:  oriented to person, place, and time. Appears chronically ill. No distress.  HENT: [place]/AT, PERRLA, no scleral icterus Mouth/Throat: Oropharynx is clear and moist. No oropharyngeal exudate.  Cardiovascular: tachy, regular rhythm and normal heart sounds. Exam reveals no gallop and no friction rub.  Pulmonary/Chest: Effort normal and breath sounds normal, except decreased on the left. has no wheezes. chest tubes from VATS are in place, serosanginous drainage- decreased output- very little Abdominal: Soft. Bowel sounds are decrease.  exhibits no distension. There is mild epigastric tenderness. Dressing in epigastric region, rectal tube in place Neurological: alert and oriented to person, place, and time.  Skin: Skin is warm and dry. No rash noted. No erythema.  Psychiatric: a normal mood and affect.  behavior is normal.    Lab Results Recent Labs    10/30/17 2156 10/31/17 0429 11/01/17 0230  WBC  --  34.8* 20.7*  HGB  --  9.8* 9.1*  HCT  --  30.7* 27.5*  NA 131*  --  133*  K 5.1  --  3.7  CL 107  --  108  CO2 17*  --  19*  BUN 18  --  22*  CREATININE 0.95  --  0.87   Liver Panel Recent Labs    11/01/17 0230  PROT 7.2  ALBUMIN 1.5*  AST 52*  ALT 37  ALKPHOS 149*  BILITOT 0.5   Sedimentation Rate No results for input(s): ESRSEDRATE in the last 72 hours.  Microbiology: 11/15 pleural fluid NGTD 11/18 blood cx NGTD Studies/Results: [place] Chest (pleural Effusion)  Result Date: 10/31/2017 CLINICAL DATA:  71 year old female with referral for thoracentesis. EXAM: CHEST ULTRASOUND COMPARISON:  10/28/2017 FINDINGS: Ultrasound survey demonstrates small fluid at the right lung base. Thoracentesis deferred at this time.  IMPRESSION: Ultrasound survey demonstrates small fluid at the right lung base. Electronically Signed   By: Corrie Mckusick D.O.   On: 10/31/2017 10:51     Assessment/Plan: Polymicrobial intra-thoracic and intra-abdominal infection = continue with boadspectrum abtx including antifungal coverage for known c.glabrata infection. Peri splenic Phlegmon in areas will take time to treat with abtx alone,   - Recommend to continue with iv abtx. Currently added VRE coverage with linezolid, though we have not isolated any new patthogens  Difficult situation given extent of infection/burden of disease/deconditioning  Drain output from chest tube is small possibly can discontinue mid week per CT surgery team which would be relief for patient.  Persistent fever/leukocytosis  = thought to be due to underlying infection. Leukocytosis is improved. Will continue to monitor fever curve and anti pyretic needs  Baxter Flattery [facility] Cell: [phone] Pager: [phone]  11/01/2017, 3:53 PM

## 2017-11-01 NOTE — Progress Notes (Signed)
Nutrition Follow-up  DOCUMENTATION CODES:   Not applicable  INTERVENTION:   -D/c 30 ml Prostat BID, due to poor acceptance -Boost Breeze po BID, each supplement provides 250 kcal and 9 grams of protein -TPN management per pharmacy  NUTRITION DIAGNOSIS:   Inadequate oral intake related to altered GI function as evidenced by (TPN dependent).  Ongoing  GOAL:   Patient will meet greater than or equal to 90% of their needs  Met via TPN  MONITOR:   PO intake, Diet advancement, Labs, Weight trends, Skin, I & O's  REASON FOR ASSESSMENT:   Consult New TPN/TNA  ASSESSMENT:   71 y.o. female with medical history significant of Crohn's disease, hiatal hernia, left buttock abscess, psoriasis, small bowel obstruction who has been admitted twice in the past 5 weeks, first from 10/03-10/21 after undergoing type IV hiatal hernia repair. She improved clinically and was on TPN, however she developed a pelvic fluid collection which required a drain placement. She subsequently improved and was weaned off of TPN and was tolerating an oral diet. ID recommended to continue Invanz and Erasis for 2 weeks. A PICC line was placed in her right upper arm and she was subsequently discharged home. She returned on 10/26 due to intermittent fevers. She presented on 11/8 with complaints of persistent nausea associated with about 3 episodes of emesis daily. She has also had loose stools, but stated that this is her baseline since she suffers from Crohn's disease.   11/12- lt thoracentesis (780 ml removed) 11/15- s/p VATS 11/21- PCT c/s; pt refusing TF (including PEG and cortrak) 11/22- per surgery, mediastinal fluid collection nearly resolved per CT 11/23- anterior abdominal abscess drain placed per IR 11/26- Megace started   Spoke with pt at bedside, who was more upbeat and copnversant than previous visit. She shares that she feels better today. Intake and appetite continue to be poor secondary to nausea and  vomiting. Pt states "there's nothing I want to eat". Pt reports that she can sip on one Boost Breeze supplement throughout the day and that is what most of her oral intake I comprised of. She does not feel like she would tolerate foods beyond clear liquids; did not offer other supplements at this time due to pt likely not being able to tolerate per her report.  Reviewed palliative care notes; pt refusing both PEG and cortrak tube. Suspect it would be extremely difficult for pt to meet nutritional needs via PO's alone.   Per pharmacy note, plan to continue continuous TPN at 70 mL/hr over 24 hr due to hypoglycemia and worsening clinical status. TPN provides 1640 kcals and 84 grams of protein, which meets 100% of pt needs.   Labs reviewed: Na: 133, CBGS: 84-115.   Diet Order:  Diet full liquid Room service appropriate? Yes; Fluid consistency: Thin TPN ADULT (ION) TPN ADULT (ION)  EDUCATION NEEDS:   Not appropriate for education at this time  Skin:  Skin Assessment: Skin Integrity Issues: Skin Integrity Issues:: Other (Comment) Incisions: abdomen Other: chest tubes  Last BM:  11/01/17 (500 ml output via rectal tube)  Height:   Ht Readings from Last 1 Encounters:  10/07/2017 5' 4.5" (1.638 m)    Weight:   Wt Readings from Last 1 Encounters:  10/13/2017 123 lb 0.3 oz (55.8 kg)    Ideal Body Weight:  55.68 kg  BMI:  Body mass index is 20.79 kg/m.  Estimated Nutritional Needs:   Kcal:  1500-1700  Protein:  75-90 grams  Fluid:  >  1.5 L    Merlina Marchena A. Jimmye Norman, RD, LDN, CDE Pager: [phone] After hours Pager: [phone]

## 2017-11-01 NOTE — Progress Notes (Signed)
Daily Progress Note   Patient Name: Danielle [name]       Date: 11/01/2017 DOB: 1946/06/26  Age: 71 y.o. MRN#: [id number] Attending Physician: Elmarie Shiley, MD Primary Care Physician: Iona Beard, MD Admit Date: 10/18/2017  Reason for Consultation/Follow-up: Establishing goals of care  Subjective: Danielle [name] is sleepy this morning. Continues to be tired and frustrated with lack of progress. Tired of nausea and cooling blanket.   Length of Stay: 18  Current Medications: Scheduled Meds:  . chlorhexidine  15 mL Mouth Rinse BID  . feeding supplement (PRO-STAT SUGAR FREE 64)  30 mL Oral BID  . fentaNYL   Intravenous Q4H  . mouth rinse  15 mL Mouth Rinse q12n4p  . megestrol  400 mg Oral BID  . metoCLOPramide (REGLAN) injection  5 mg Intravenous Q6H  . pantoprazole  40 mg Intravenous Q12H  . promethazine  6.25 mg Intravenous TID AC  . sodium chloride flush  5 mL Intravenous Q8H    Continuous Infusions: . acetaminophen 1,000 mg (11/01/17 0808)  . anidulafungin 100 mg (10/31/17 1715)  . linezolid (ZYVOX) IV 600 mg (11/01/17 0830)  . meropenem (MERREM) IV Stopped (11/01/17 0602)  . potassium chloride Stopped (10/22/17 1145)  . TPN ADULT (ION) 70 mL/hr at 11/01/17 0400  . TPN ADULT (ION)      PRN Meds: acetaminophen, diphenhydrAMINE **OR** diphenhydrAMINE, fentaNYL (SUBLIMAZE) injection, hydrocortisone, lip balm, liver oil-zinc oxide, naloxone **AND** sodium chloride flush, naphazoline-glycerin, nystatin, oxyCODONE, phenol, potassium chloride, prochlorperazine, promethazine, sodium chloride flush, traMADol  Physical Exam     Constitutional: She is oriented to person, place, and time. She has a sickly appearance.  Frail, malnourished  HENT:  Head: Normocephalic and  atraumatic.  Cardiovascular: Regular rhythm. Tachycardia present.  Pulmonary/Chest: Effort normal. No accessory muscle usage. No tachypnea. No respiratory distress. She has decreased breath sounds in the left lower field.  Lt CT in place  Abdominal: Soft. Normal appearance.  Neurological: She is alert and oriented to person, place, and time.  Psychiatric:  Tired, frustrated  Nursing note and vitals reviewed.    Vital Signs: BP (!) 153/80 (BP Location: Right Leg)   Pulse (!) 106   Temp 99.6 F (37.6 C) (Rectal)   Resp (!) 30   Ht 5' 4.5" (1.638 m)   Wt 55.8 kg (  123 lb 0.3 oz)   SpO2 96%   BMI 20.79 kg/m  SpO2: SpO2: 96 % O2 Device: O2 Device: Not Delivered O2 Flow Rate: O2 Flow Rate (L/min): 2 L/min  Intake/output summary:   Intake/Output Summary (Last 24 hours) at 11/01/2017 1023 Last data filed at 11/01/2017 1000 Gross per 24 hour  Intake 3078.84 ml  Output 3587 ml  Net -508.16 ml   LBM: Last BM Date: 10/31/17 Baseline Weight: Weight: 59 kg (130 lb) Most recent weight: Weight: 55.8 kg (123 lb 0.3 oz)       Palliative Assessment/Data: 20%    Flowsheet Rows     Most Recent Value  Intake Tab  Referral Department  Hospitalist  Unit at Time of Referral  Med/Surg Unit  Palliative Care Primary Diagnosis  Nephrology  Date Notified  10/26/17  Palliative Care Type  New Palliative care  Reason for referral  Clarify Goals of Care  Date of Admission  10/10/2017  Date first seen by Palliative Care  10/27/17  # of days Palliative referral response time  1 Day(s)  # of days IP prior to Palliative referral  12  Clinical Assessment  Psychosocial & Spiritual Assessment  Palliative Care Outcomes      Patient Active Problem List   Diagnosis Date Noted  . Abscess   . Intractable vomiting with nausea   . Goals of care, counseling/discussion   . Palliative care encounter   . Fever   . Intra-abdominal infection   . Hemoptysis   . Dehydration   . Acute embolism and  thrombosis of left axillary vein ([place])   . Septic shock ([place]) 10/17/2017  . Patient's noncompliance with intervention / medical advice  10/17/2017  . 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 Pleural effusion, left 10/16/2017  . Mediastinal abscess ([place]) 10/26/2017  . DVT (deep venous thrombosis) ([place])   . Metabolic acidosis   . Thrombocytosis ([place])   . Protein-calorie malnutrition, moderate ([place])   . Normochromic normocytic anemia   . SIRS (systemic inflammatory response syndrome) ([place]) 10/01/2017  . Pressure injury of skin 09/25/2017  . Hypoglycemia 09/25/2017  . Electrolyte imbalance 09/15/2017  . Anasarca 09/13/2017  . S/P thoracentesis 09/11/2017  . Peritonitis ([place]) 09/11/2017  . Fungemia 09/11/2017  . Gastrostomy tube in place ([place]) 09/08/2017  . Protein-calorie malnutrition, severe 06/10/2017  . Vomiting in adult patient 06/08/2017  . 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 Hypotension 05/07/2017  . UTI (urinary tract infection) 05/07/2017  . Chest tube in place   . Crohn disease ([place]) 02/19/2017  . Immunosuppressed status ([place]) 12/09/2015  . Hiatal hernia 12/07/2015  . Acute renal failure superimposed on stage 3 chronic kidney disease ([place]) 12/06/2015  . Perianal Crohn's disease, with fistula ([place])   . Hypomagnesemia   . Leukocytosis 11/06/2011    Palliative Care Assessment & Plan   HPI: 71 y.o. female  with past medical history of Crohn's disease, incarcerated hiatal hernia, CKD stage 3, diastolic heart failure, SBO, left buttock abscess, psoriasis, colostomy with take down ~1 month 2009. Admitted on 11/03/2017 with nausea and vomiting. Has had complicated history with hernia operation 90/2 and stay complicated by abd pain and G tube leaking requiring reoperation, purulent peritonitis, requiring intubation and vasopressors, ileus, fungemia, pelvic fluid collection drained 10/3-10/21 but discharged to home being able to walk and tolerated diet. Readmitted 10/26-11/1 with  fever felt to be related to dehydration and post infection process followed by ID and with RUE DVT. Currently ID following for treatment of infection  and monitoring blood cultures and fluid collections. Had thoracentesis 11/12 and VATs for mediastinum abscess 11/15. She is severely malnourished (on TPN) and deconditioned with poorly controlled infection with continued high fevers and nausea. Recommendation to stop TPN to better control infection but intake is very poor. IR consulted and request CT abd to assess for feeding tube placement. Anterior abd abscess drain placed 10/29/17. Palliative requested to assist with GOC.    Assessment: I continue to meet with Ms. Brosious - no family at bedside but sister has already been in this morning she tells me. I continued to reinforce to Ms. Agostino why she is so sick. She often tells me "I was okay at home" and "the phenergan was helping at home." I continue to explain that a lot has happened since she has been back in the hospital. We discussed that her body and immune system are so very weak and has not been able to fight off all these infections even with all these antibiotics and antifungals. She continues to say that she feels "if I can get just a little stronger and able to eat a little, I think I'll get better." We discussed how this has been our hope all along but unfortunately this has not happened. Emotional support provided.   Ms. Casseus continues to desire full aggressive care. Does not seem as optimistic today though. I will continue to follow.   Recommendations/Plan:  Nausea/vomiting: Zofran gives her headaches -try and avoid, I d/c'd as she says this does not help at all anyways. Phenergan scheduled TID with meals 6.25 mg IV. Phenergan 6.25 mg IV every 6 hours prn. Phenergan was increased and QTc is rising which is why I had ordered such a small dose. Decreased dose again and RN monitoring closely QTc and efficacy. Will also add prn Compazine which  may have less effect on QTc but also monitor closely. Monitor QTc and call/hold antiemetics for QTc >0.42. Could consider low dose Ativan for nausea if QTc restricting other antiemetics BUT I worry about confusion/delirium and sedation with this. Megace and Reglan added by surgery today.   Malnutrition: Would benefit from infectious standpoint to d/c TPN. Feeding tube a poor option and she also refuses anyway (even temporary Cortrak/NGT) - discussed again today. Continuing TPN for now.   Sacrum/buttocks breakdown: Desitin ordered as family says this worked best in the past. [place] consulted.    Goals of Care and Additional Recommendations:  Limitations on Scope of Treatment: Full Scope Treatment, refuses feeding tube  Code Status:  Full code  Prognosis:   Very poor with severe deconditioning and extensive infection burden. Unfortunately not really making any progress.   Discharge Planning:  To Be Determined   Thank you for allowing the Palliative Medicine Team to assist in the care of this patient.   Total Time 25 min Prolonged Time Billed  no       Greater than 50%  of this time was spent counseling and coordinating care related to the above assessment and plan.  Vinie Sill, NP Palliative Medicine Team Pager # [phone] (M-F 8a-5p) Team Phone # [phone] (Nights/Weekends)

## 2017-11-01 NOTE — Plan of Care (Signed)
Continue current care plan 

## 2017-11-01 NOTE — Progress Notes (Signed)
PROGRESS NOTE    Danielle [name]  WKM:[id number] DOB: 11/16/1946 DOA: 10/07/2017 PCP: Iona Beard, MD    Brief Narrative: 71 y.o.F w/ a Hxof Crohn's disease, hiatal hernia,psoriasis, and SBO who underwent a type IV hiatal hernia repair in October 2018.Postoperatively the patient required endotracheal intubation, mechanical ventilation and the use of pressors. She was extubated on postop day #2. She improved clinically and was on parenteral nutrition, however she developed a pelvic fluid collection which required a drain. On 09/11/2017 she had blood cultures positive for Candida glabrata, and the pelvic abscess culture was positive for C. Albicans and C. Glabrata. She was given treatment withparenteral antifungals. She subsequently improved, was able to ambulate with physical therapy, weaned off of parenteral nutrition and was tolerating oral diet. ID recommended to continue Invanz andErasisfor 2 weeks. A PICC line was placed in her right arm and she was ultimately discharged home.  She returned on 10/01/2017 due to intermittent fevers. She was treated empirically and clinically improved to the point that she was able to be d/c home again on 11/1.  She returned to the ED 11/8 with complaints of persistent nausea associated with emesis. CT abdomen pelvis showed re accumulation of fluid collection in the lower mediastinum, bibasilar pleural effusion, another small complex collection along the posterior margin of the spleen. Patient underwent left video assisted thoracoscopy for mediastinal drainage of mediastinal abscess. ID has been helping with management of IV antibiotics.   Patient continue to spike fevers, persistent nausea and vomiting. She has been on TPN, which was discontinue due to persistent fevers.   Palliative care team met with patient and her family. Patients wants to continue with aggressive care.    Assessment & Plan:   Principal Problem:   Septic shock  ([place]) Active Problems:   Leukocytosis   Perianal Crohn's disease, with fistula ([place])   Acute renal failure superimposed on stage 3 chronic kidney disease ([facility])   Hiatal hernia   Immunosuppressed status ([facility])   Chest tube in place   Hypotension   Vomiting in adult patient   S/P thoracentesis   Anasarca   DVT (deep venous thrombosis) ([facility])   Protein-calorie malnutrition, moderate ([facility])   Mediastinal abscess ([facility])   Mediastinal post-op seromas   Pleural effusion, left   Patient's noncompliance with intervention / medical advice    Dehydration   Acute embolism and thrombosis of left axillary vein ([facility])   Goals of care, counseling/discussion   Palliative care encounter   Fever   Intra-abdominal infection   Hemoptysis   Abscess   Intractable vomiting with nausea   SevereSepsis/Mediastinal abscess; Patient underwent esophagogram showing 2.5 cm area of extrinsic compression upon esophagus, underwent also thoracentesis. Case was reviewed by IR and gastroenterologist, recommendation was for CVTS consult.  -Patient underwent VAST drainage 11-15. -S/P VATS drainage on 11/15. Patient continue to spike fever, leukocytosis. .  Repeated CT scan 11-19; Ill-defined multiloculated rim enhancing fluid collection and extensive edema in posterior mediastinum inferior to mediastinal drain and anterior to aorta, probably representing the residual of drained abscess. Discrete 14 mm abscess present in the right posterior mediastinum. Multiloculated rim enhancing fluid collection surrounding the spleen and extending into left pericolic gutter, likely abscess. Small bilateral pleural effusions with left pleural drain. Stable dependent lower lobe consolidations may represent atelectasis or pneumonia. -Per CVTS mediastinal drain in good position, still has phlegmon in the area.  -CT abdomen pelvis; near complete resolution of mediastinal abscess. Stable small rim enhancing fluid collections along  posterosuperior margin of the spleen and pelvic cul-de-sac, suspicious for abscesses. -IV meropenem and Eraxis.  -underwent CT guide placement of 10 Fr drain in anterior abdominal abscess. Fluid growing yeast and Gram positive cocci in pairs.  Started on Zyvox 11-25. WBC decrease from 30---to 20/ continue to spike fever. Culture still pending.   Right side pleural effusion;  Patient agree to proceed with thoracentesis but tomorrow.  [place] perform not significant fluid to be removed.   Persistent fevers, leukocytosis;  Multifactorial, abdominal abscess, mediastinal abscess.   Type IV hiatal hernia repair in October 2018. Post operative pelvic abscess;  -CT chest 11-19. For Multiloculated rim enhancing fluid collection surrounding the spleen and extending into left pericolic gutter, likely abscess. -CT abdomen and pelvis;  near complete resolution of mediastinal abscess. Stable small rim enhancing fluid collections along posterosuperior margin of the spleen and pelvic cul-de-sac, suspicious for abscesses. -underwent CT guide placement of 10 Fr drain in anterior abdominal abscess. Fluid growing yeast and Gram positive cocci in pairs. Culture still pending.  Started Zyvox.   Known candida galbatra infection; culture 10-12, pelvis abscess; on Eraxis. ID following  Repeated blood culture 11-25 pending/.   History of Crohns/ chronic diarrhea  C diff 11-10 was negative.  CT abdomen with Mild increase in wall thickening and adjacent inflammatory changes involving the distal ileum, with mild dilatation of several small bowel loops proximal to this site. This is consistent with Crohn's disease and stricture formation. -C diff negative.  -No candidate for treatment for Crohn's  per GI.   Urinary Retention; continue with foley catheter; will need voiding trial.   Nutrition;  Nutrition started on TPN.  Patient with a recent fungemia with candida glabrata and albicans.  Per Dr Fredderick Erb ok to continue  with TPN for now.  Per Dr Rosendo Gros; oral trial. He doubt G tube will be stable.  Follow Sx recommendation regarding Jejunostomy tube  Anemia; no further hematemesis.  Hb slowly trending down. Monitor daily   Chronic diastolic CHF Compensated.   Recent Hematemesis/hemoptysis -stopped IV heparin  -lateral upper extremity DVT 09/2017.  -positive DVT in the left radial vein at this time -no recurrent hematemesis or hemoptysis. IV Protonix drip -CT scan of the chest shows evidence of esophageal wall thickening consistent with esophagitis -GI following.   RUE DVT; holding heparin due to hematemesis.  When hb stable plan to resume heparin if clear by GI.       DVT prophylaxis: SCD.  Code Status: full code.  Family Communication: care discussed with patient.  Disposition Plan: remain in patient for IV antibiotics.    Consultants:   General surgery  CVTS  GI   Procedures: events; Significant Events: 11/8 readmit 11/11 PICC placed 11/12 thoracentesis 11/5 to OR at [facility] for L VATS for mediastinal drainage     Antimicrobials; antifungal  Erasix 10-16-2017  Meropenem 10-27-2017     Subjective: She is sleepy, just received compazine. Vomited this am. No blood.   Objective: Vitals:   11/01/17 0329 11/01/17 0739 11/01/17 0800 11/01/17 0848  BP: 116/67     Pulse: (!) 103   (!) 117  Resp: (!) 33  (!) 33 (!) 39  Temp: 99.3 F (37.4 C) (!) 101.9 F (38.8 C)  (!) 102.5 F (39.2 C)  TempSrc: Rectal Rectal  Rectal  SpO2: 98% 96% 100%   Weight:      Height:        Intake/Output Summary (Last 24 hours) at 11/01/2017 0901 Last data filed  at 11/01/2017 0830 Gross per 24 hour  Intake 2952.84 ml  Output 3587 ml  Net -634.16 ml   Filed Weights   10/15/17 1449 10/20/17 1600 10/08/2017 0500  Weight: 55.9 kg (123 lb 3.8 oz) 55.3 kg (121 lb 14.6 oz) 55.8 kg (123 lb 0.3 oz)    Examination:  General exam: Chronic ill, sleepy Respiratory system;Normal  respiratory effort, few ronchus, drain left side chest.  Cardiovascular system: S 1,S 2 Tachycardic Gastrointestinal system: BS present, soft, midline abdominal wound healing. Drain mid  quadrant.  Central nervous system: sleepy Extremities: no edema   Data Reviewed: I have personally reviewed following labs and imaging studies  CBC: Recent Labs  Lab 10/26/17 0323 10/27/17 0500 10/28/17 0410 10/29/17 0315 10/30/17 0845 10/31/17 0429 11/01/17 0230  WBC 23.9* 19.5* 18.3* 20.6* 24.1* 34.8* 20.7*  NEUTROABS 17.0* 13.1*  --   --   --   --  14.5*  HGB 9.2* 8.9* 8.6* 8.9* 10.1* 9.8* 9.1*  HCT 28.4* 28.1* 27.4* 28.0* 31.2* 30.7* 27.5*  MCV 84.3 85.4 93.5 84.6 84.6 84.1 84.6  PLT 253 266 280 286 361 364 096   Basic Metabolic Panel: Recent Labs  Lab 10/28/17 0500 10/29/17 0315 10/30/17 0845 10/30/17 2156 10/31/17 0429 11/01/17 0230  NA 135 135 132* 131*  --  133*  K 4.5 4.6 5.5* 5.1  --  3.7  CL 107 106 105 107  --  108  CO2 22 22 19* 17*  --  19*  GLUCOSE 110* 109* 83 129*  --  109*  BUN 18 18 21* 18  --  22*  CREATININE 0.87 0.88 0.86 0.95  --  0.87  CALCIUM 7.8* 8.2* 8.4* 8.5*  --  8.2*  MG 1.9  --   --  1.9  --  2.0  PHOS 4.2  --   --   --  3.9 4.6   GFR: Estimated Creatinine Clearance: 53.8 mL/min (by C-G formula based on SCr of 0.87 mg/dL). Liver Function Tests: Recent Labs  Lab 10/28/17 0500 11/01/17 0230  AST 21 52*  ALT 16 37  ALKPHOS 120 149*  BILITOT 0.3 0.5  PROT 6.0* 7.2  ALBUMIN 1.3* 1.5*   No results for input(s): LIPASE, AMYLASE in the last 168 hours. No results for input(s): AMMONIA in the last 168 hours. Coagulation Profile: Recent Labs  Lab 10/29/17 1115  INR 1.24   Cardiac Enzymes: No results for input(s): CKTOTAL, CKMB, CKMBINDEX, TROPONINI in the last 168 hours. BNP (last 3 results) No results for input(s): PROBNP in the last 8760 hours. HbA1C: No results for input(s): HGBA1C in the last 72 hours. CBG: Recent Labs  Lab  10/30/17 2049 10/31/17 0758 10/31/17 1351 11/01/17 0010 11/01/17 0739  GLUCAP 72 113* 112* 115* 84   Lipid Profile: Recent Labs    11/01/17 0230  TRIG 117   Thyroid Function Tests: No results for input(s): TSH, T4TOTAL, FREET4, T3FREE, THYROIDAB in the last 72 hours. Anemia Panel: No results for input(s): VITAMINB12, FOLATE, FERRITIN, TIBC, IRON, RETICCTPCT in the last 72 hours. Sepsis Labs: Recent Labs  Lab 10/26/17 0323 10/30/17 2320 10/31/17 0145  LATICACIDVEN 1.0 1.0 1.1    Recent Results (from the past 240 hour(s))  Culture, blood (routine x 2)     Status: None   Collection Time: 10/24/17 12:06 AM  Result Value Ref Range Status   Specimen Description BLOOD LEFT FOOT  Final   Special Requests   Final    BOTTLES DRAWN AEROBIC AND  ANAEROBIC Blood Culture adequate volume   Culture NO GROWTH 5 DAYS  Final   Report Status 10/29/2017 FINAL  Final  Culture, blood (routine x 2)     Status: None   Collection Time: 10/24/17 12:16 AM  Result Value Ref Range Status   Specimen Description BLOOD RIGHT FOOT  Final   Special Requests   Final    BOTTLES DRAWN AEROBIC ONLY Blood Culture results may not be optimal due to an inadequate volume of blood received in culture bottles   Culture NO GROWTH 5 DAYS  Final   Report Status 10/29/2017 FINAL  Final  Fungus culture, blood     Status: None   Collection Time: 10/24/17 12:16 AM  Result Value Ref Range Status   Specimen Description BLOOD LEFT FOOT  Final   Special Requests   Final    BOTTLES DRAWN AEROBIC AND ANAEROBIC Blood Culture adequate volume   Culture NO GROWTH 7 DAYS NO FUNGUS ISOLATED  Final   Report Status 10/31/2017 FINAL  Final  Culture, blood (routine x 2)     Status: None   Collection Time: 10/24/17  3:50 PM  Result Value Ref Range Status   Specimen Description BLOOD RIGHT FOOT  Final   Special Requests IN PEDIATRIC BOTTLE Blood Culture adequate volume  Final   Culture NO GROWTH 5 DAYS  Final   Report Status  10/29/2017 FINAL  Final  Culture, blood (routine x 2)     Status: None (Preliminary result)   Collection Time: 10/27/17  1:19 PM  Result Value Ref Range Status   Specimen Description BLOOD RIGHT HAND  Final   Special Requests IN PEDIATRIC BOTTLE Blood Culture adequate volume  Final   Culture NO GROWTH 4 DAYS  Final   Report Status PENDING  Incomplete  Culture, blood (routine x 2)     Status: None (Preliminary result)   Collection Time: 10/27/17  1:24 PM  Result Value Ref Range Status   Specimen Description BLOOD RIGHT HAND  Final   Special Requests IN PEDIATRIC BOTTLE Blood Culture adequate volume  Final   Culture NO GROWTH 4 DAYS  Final   Report Status PENDING  Incomplete  Culture, Urine     Status: None   Collection Time: 10/27/17  4:44 PM  Result Value Ref Range Status   Specimen Description URINE, RANDOM  Final   Special Requests NONE  Final   Culture NO GROWTH  Final   Report Status 10/28/2017 FINAL  Final  Aerobic/Anaerobic Culture (surgical/deep wound)     Status: None (Preliminary result)   Collection Time: 10/29/17  4:17 PM  Result Value Ref Range Status   Specimen Description ABSCESS  Final   Special Requests ABDOMEN  Final   Gram Stain   Final    ABUNDANT WBC PRESENT, PREDOMINANTLY PMN RARE GRAM POSITIVE COCCI IN PAIRS RARE BUDDING YEAST SEEN    Culture NO GROWTH 1 DAY  Final   Report Status PENDING  Incomplete  C difficile quick scan w PCR reflex     Status: None   Collection Time: 10/29/17  4:25 PM  Result Value Ref Range Status   C Diff antigen NEGATIVE NEGATIVE Final   C Diff toxin NEGATIVE NEGATIVE Final   C Diff interpretation No C. difficile detected.  Final         Radiology Studies: [place] Chest (pleural Effusion)  Result Date: 10/31/2017 CLINICAL DATA:  71 year old female with referral for thoracentesis. EXAM: CHEST ULTRASOUND COMPARISON:  10/28/2017 FINDINGS: Ultrasound  survey demonstrates small fluid at the right lung base. Thoracentesis deferred  at this time. IMPRESSION: Ultrasound survey demonstrates small fluid at the right lung base. Electronically Signed   By: Corrie Mckusick D.O.   On: 10/31/2017 10:51        Scheduled Meds: . chlorhexidine  15 mL Mouth Rinse BID  . feeding supplement (PRO-STAT SUGAR FREE 64)  30 mL Oral BID  . fentaNYL   Intravenous Q4H  . mouth rinse  15 mL Mouth Rinse q12n4p  . megestrol  400 mg Oral BID  . metoCLOPramide (REGLAN) injection  5 mg Intravenous Q6H  . pantoprazole  40 mg Intravenous Q12H  . promethazine  6.25 mg Intravenous TID AC  . sodium chloride flush  5 mL Intravenous Q8H   Continuous Infusions: . acetaminophen 1,000 mg (11/01/17 0808)  . anidulafungin 100 mg (10/31/17 1715)  . linezolid (ZYVOX) IV 600 mg (11/01/17 0830)  . meropenem (MERREM) IV Stopped (11/01/17 0602)  . potassium chloride Stopped (10/22/17 1145)  . TPN ADULT (ION) 70 mL/hr at 11/01/17 0400     LOS: 18 days    Time spent: 35 minutes.     Elmarie Shiley, MD [facility] Pager [phone]  If 7PM-7AM, please contact night-coverage [email or web address] Password [email or web address] 11/01/2017, 9:01 AM

## 2017-11-02 LAB — CBC
HEMATOCRIT: 27.4 % — AB (ref 36.0–46.0)
HEMOGLOBIN: 8.7 g/dL — AB (ref 12.0–15.0)
MCH: 26.9 pg (ref 26.0–34.0)
MCHC: 31.8 g/dL (ref 30.0–36.0)
MCV: 84.6 fL (ref 78.0–100.0)
Platelets: 329 10*3/uL (ref 150–400)
RBC: 3.24 MIL/uL — AB (ref 3.87–5.11)
RDW: 19 % — ABNORMAL HIGH (ref 11.5–15.5)
WBC: 19.5 10*3/uL — AB (ref 4.0–10.5)

## 2017-11-02 LAB — BASIC METABOLIC PANEL
ANION GAP: 6 (ref 5–15)
BUN: 20 mg/dL (ref 6–20)
CHLORIDE: 107 mmol/L (ref 101–111)
CO2: 20 mmol/L — AB (ref 22–32)
Calcium: 8.2 mg/dL — ABNORMAL LOW (ref 8.9–10.3)
Creatinine, Ser: 0.77 mg/dL (ref 0.44–1.00)
GFR calc non Af Amer: >60 mL/min (ref >60)
Glucose, Bld: 101 mg/dL — ABNORMAL HIGH (ref 65–99)
Potassium: 3.2 mmol/L — ABNORMAL LOW (ref 3.5–5.1)
Sodium: 133 mmol/L — ABNORMAL LOW (ref 135–145)

## 2017-11-02 LAB — GLUCOSE, CAPILLARY
GLUCOSE-CAPILLARY: 110 mg/dL — AB (ref 65–99)
GLUCOSE-CAPILLARY: 110 mg/dL — AB (ref 65–99)
Glucose-Capillary: 99 mg/dL (ref 65–99)

## 2017-11-02 LAB — PHOSPHORUS: PHOSPHORUS: 3.6 mg/dL (ref 2.5–4.6)

## 2017-11-02 LAB — MAGNESIUM: MAGNESIUM: 2 mg/dL (ref 1.7–2.4)

## 2017-11-02 MED ORDER — SODIUM CHLORIDE 0.9 % IV BOLUS (SEPSIS)
1000.0000 mL | Freq: Once | INTRAVENOUS | Status: AC
  Administered 2017-11-03: 1000 mL via INTRAVENOUS

## 2017-11-02 MED ORDER — AZTREONAM 1 G IJ SOLR
1.0000 g | Freq: Three times a day (TID) | INTRAMUSCULAR | Status: DC
  Administered 2017-11-02 – 2017-11-04 (×8): 1 g via INTRAVENOUS
  Filled 2017-11-02 (×11): qty 1

## 2017-11-02 MED ORDER — AZTREONAM 2 G IJ SOLR
1.0000 g | Freq: Three times a day (TID) | INTRAMUSCULAR | Status: DC
  Filled 2017-11-02: qty 2

## 2017-11-02 MED ORDER — SODIUM CHLORIDE 0.9 % IV BOLUS (SEPSIS)
500.0000 mL | Freq: Once | INTRAVENOUS | Status: AC
  Administered 2017-11-02: 500 mL via INTRAVENOUS

## 2017-11-02 MED ORDER — METRONIDAZOLE IN NACL 5-0.79 MG/ML-% IV SOLN
500.0000 mg | Freq: Three times a day (TID) | INTRAVENOUS | Status: DC
  Administered 2017-11-02 – 2017-11-04 (×8): 500 mg via INTRAVENOUS
  Filled 2017-11-02 (×10): qty 100

## 2017-11-02 MED ORDER — TRAVASOL 10 % IV SOLN
INTRAVENOUS | Status: AC
  Administered 2017-11-02: 18:00:00 via INTRAVENOUS
  Filled 2017-11-02: qty 600

## 2017-11-02 MED ORDER — ACETAMINOPHEN 10 MG/ML IV SOLN
1000.0000 mg | Freq: Once | INTRAVENOUS | Status: AC
  Administered 2017-11-02: 1000 mg via INTRAVENOUS
  Filled 2017-11-02: qty 100

## 2017-11-02 MED ORDER — METOPROLOL TARTRATE 5 MG/5ML IV SOLN
5.0000 mg | Freq: Four times a day (QID) | INTRAVENOUS | Status: DC | PRN
  Administered 2017-11-02: 5 mg via INTRAVENOUS
  Filled 2017-11-02: qty 5

## 2017-11-02 MED ORDER — SODIUM CHLORIDE 0.9 % IV SOLN
600.0000 mg | Freq: Once | INTRAVENOUS | Status: AC
  Administered 2017-11-03: 600 mg via INTRAVENOUS
  Filled 2017-11-02: qty 6

## 2017-11-02 MED ORDER — ACETAMINOPHEN 10 MG/ML IV SOLN
1000.0000 mg | Freq: Four times a day (QID) | INTRAVENOUS | Status: AC
  Administered 2017-11-02 – 2017-11-03 (×4): 1000 mg via INTRAVENOUS
  Filled 2017-11-02 (×7): qty 100

## 2017-11-02 NOTE — Progress Notes (Signed)
Notified Dr. Tyrell Antonio regarding change in vital signs, see below. Scheduled Tylenol given early. No new orders at this time  Will continue to monitor closely    11/02/17 1600  Vitals  Temp (!) 104.1 F (40.1 C) (notified MD)  Temp Source Rectal  BP (!) 205/88  MAP (mmHg) 115  BP Location Left Leg  BP Method Automatic  Patient Position (if appropriate) Lying  Pulse Rate Source Monitor  ECG Heart Rate (!) 141  Ectopy Frequency Occasional  Resp (!) 56  Oxygen Therapy  SpO2 96 %  O2 Device Room Air  Pulse Oximetry Type Continuous

## 2017-11-02 NOTE — Progress Notes (Signed)
      [address]       [place] [address]             [phone]      "I feel pretty good" PO intake poor  BP 118/67 (BP Location: Left Leg)   Pulse (!) 117   Temp 99.7 F (37.6 C) (Rectal)   Resp (!) 28   Ht 5' 4.5" (1.638 m)   Wt 123 lb 0.3 oz (55.8 kg)   SpO2 97%   BMI 20.79 kg/m    Intake/Output Summary (Last 24 hours) at 11/02/2017 3754 Last data filed at 11/02/2017 0556 Gross per 24 hour  Intake 2470 ml  Output 2391 ml  Net 79 ml   Lungs clear Minimal output from mediastinal drain  Fevers persist Minimal output from mediastinal drain Agree with Dr. Anselm Pancoast re: repeat CT before consideration to removing drains  Remo Lipps C. Roxan Hockey, MD [facility] [phone]

## 2017-11-02 NOTE — Progress Notes (Signed)
[facility] NOTE   Pharmacy Consult for TPN Indication: Poor PO intake / unable to place repeat G-tube   Patient Measurements: Height: 5' 4.5" (163.8 cm) Weight: 123 lb 0.3 oz (55.8 kg) IBW/kg (Calculated) : 55.85 TPN AdjBW (KG): 59 Body mass index is 20.79 kg/m.  Assessment:  82 yoF re-admit 11/8 with N/V, loose stools (baseline w/Crohn's). Was hospitalized for a month in October and developed fungemia and treated with Invanz and anidulafungin. Re-admitted a week later with intermittent fevers and difficultly swallowing. CT chest showed recurrent mediastinal fluid abscess.  PMH: Crohns, HH, hx SBO  Significant events:  11/15 Left VATs to drain mediastinum  11/23 IR guided perihepatic abscess drain placement   GI: Mediastinal abscess complex, had been advanced to full liquid diet but N/V with almost anything PO - nausea continuing. Clear liquids for now. Phenergan low dose d/t Qtc TID and monitoring for each dose. Avoiding Zofran. Prn Compazine. Patient refuses feeding tube. Attempting full liquids again 11/25 (poor appetite reported). Abd abscess drain output diminishing. Adding megace/reglan to regimen 11/26. PPI IV. GOC being assessed daily. Albumin low at 1.5, Pre-albumin low at 8. Last BM 11/26 Endo: CBGs remain on lower end on continuous TPN, 94-110. Sliding scale insulin stopped on 11/21. D5 d/c'd 11/26 Insulin requirements past 24 hours: 0 units regular insulin  Lytes: Na low stable133, Cl wnl, CO2 up 20. K down to 3.2. Phos down to 3.6 after decreasing phos in TPN, CoCa 10.2. Mag wnl Renal: SCr 0.77 stable, BUN 20. UOP 1.8 mL/kg/hr Neuro: Fent PCA, APAP prn - pain score 2-10 Pulm: Bilateral chest tube output at 20 mL / 24 hours, chest tubes to remain in place for several days. R-effusion larger 11/23 - not significant fluid to remove Cards: BP soft-wnl, NSR 100s Anticoag: RUE DVT - holding heparin due to hematemesis Hepatobil: AST  mildly elevated, ALT and Tbili wnl. Alk phos up 149. Trig [id number] ID: ID changed to Aztreonam + Flagyl + Anidulafungin for mediastinal abscess due to concern for possible drug fever. 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 Rare budding yeast, GPC in pairs on Gstain of 11/23 abd abscess cx. Tmax/24h 102.8, WBC down to 19.5. Drain in place. Cdiff negative. 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 Repeat blood cultures sent 11/25  Best Practices: SCDs TPN Access: PICC 11/11>> TPN start date: 11/11>>  Nutritional Goals (per RD recommendation on 11/19): Kcal:  1500-1700 Protein:  75-90 grams  Fluid:  > 1.5 L  Current Nutrition:  Clear liquid (appetite poor) Boost Breeze PO BID added 11/26 - provides 500 kcal + 18g protein total daily (all doses charted) Custom TPN   Plan:  Decrease continuous TPN to 50 mL/hr over 24 hr to account for addition of Boost Breeze, attempt to stimulate appetite TPN provides 60 g of protein, 168 g of dextrose, and 60 g lipids providing 1171 total kcal, which meets ~100% of patient needs in combination with Boost Breeze supplements. Electrolytes in TPN: increase K again, increase phos, continue max acetate AddMVI,trace elements in TPN Continue CBG checks for hypoglycemic events Monitor TPN labs, clinical progress, Qtc F/U ability to tolerate PO diet, GOC  Already given K runs x 3 this AM (PRN order)   Elicia Lamp, PharmD, BCPS Clinical Pharmacist Rx Phone # for today: [phone] After 3:30PM, please call Main Rx: [phone] 11/02/2017 7:53 AM

## 2017-11-02 NOTE — Progress Notes (Signed)
12 Days Post-Op   Subjective/Chief Complaint: " I feel better today than yesterday." Pt with no acute changes Min PO intake some nausea  Objective: Vital signs in last 24 hours: Temp:  [97.9 F (36.6 C)-102.8 F (39.3 C)] 99.5 F (37.5 C) (11/27 0843) Pulse Rate:  [100-125] 112 (11/27 0843) Resp:  [21-41] 26 (11/27 0843) BP: (95-162)/(54-78) 151/78 (11/27 0843) SpO2:  [90 %-100 %] 100 % (11/27 0843) Last BM Date: 10/29/17  Intake/Output from previous day: 11/26 0701 - 11/27 0700 In: 2670 [P.O.:480; I.V.:840; IV Piggyback:1330] Out: 2891 [Urine:2375; Drains:16; Stool:500] Intake/Output this shift: No intake/output data recorded.  General appearance: alert and cooperative GI: soft, non-tender; bowel sounds normal; no masses,  no organomegaly  Lab Results:  Recent Labs    11/01/17 0230 11/02/17 0417  WBC 20.7* 19.5*  HGB 9.1* 8.7*  HCT 27.5* 27.4*  PLT 331 329   BMET Recent Labs    11/01/17 0230 11/02/17 0417  NA 133* 133*  K 3.7 3.2*  CL 108 107  CO2 19* 20*  GLUCOSE 109* 101*  BUN 22* 20  CREATININE 0.87 0.77  CALCIUM 8.2* 8.2*   PT/INR No results for input(s): LABPROT, INR in the last 72 hours. ABG No results for input(s): PHART, HCO3 in the last 72 hours.  Invalid input(s): PCO2, PO2  Studies/Results: [place] Chest (pleural Effusion)  Result Date: 10/31/2017 CLINICAL DATA:  71 year old female with referral for thoracentesis. EXAM: CHEST ULTRASOUND COMPARISON:  10/28/2017 FINDINGS: Ultrasound survey demonstrates small fluid at the right lung base. Thoracentesis deferred at this time. IMPRESSION: Ultrasound survey demonstrates small fluid at the right lung base. Electronically Signed   By: Corrie Mckusick D.O.   On: 10/31/2017 10:51    Anti-infectives: Anti-infectives (From admission, onward)   Start     Dose/Rate Route Frequency Ordered Stop   11/02/17 1200  aztreonam (AZACTAM) injection 1 g     1 g Intravenous Every 8 hours 11/02/17 0923     11/02/17  1000  metroNIDAZOLE (FLAGYL) IVPB 500 mg     500 mg 100 mL/hr over 60 Minutes Intravenous Every 8 hours 11/02/17 0923     10/31/17 1215  linezolid (ZYVOX) IVPB 600 mg     600 mg 300 mL/hr over 60 Minutes Intravenous Every 12 hours 10/31/17 1202     10/27/17 1400  meropenem (MERREM) 1 g in sodium chloride 0.9 % 100 mL IVPB  Status:  Discontinued     1 g 200 mL/hr over 30 Minutes Intravenous Every 8 hours 10/27/17 1245 11/02/17 0921   10/27/17 1100  levofloxacin (LEVAQUIN) IVPB 750 mg  Status:  Discontinued     750 mg 100 mL/hr over 90 Minutes Intravenous Every 24 hours 10/27/17 1041 10/27/17 1218   10/27/17 0400  piperacillin-tazobactam (ZOSYN) IVPB 3.375 g  Status:  Discontinued     3.375 g 12.5 mL/hr over 240 Minutes Intravenous Every 8 hours 10/26/17 2201 10/27/17 1222   10/22/17 0600  vancomycin (VANCOCIN) IVPB 1000 mg/200 mL premix  Status:  Discontinued     1,000 mg 200 mL/hr over 60 Minutes Intravenous To Short Stay 10/23/2017 1631 10/07/2017 1637   11/03/2017 2100  vancomycin (VANCOCIN) IVPB 1000 mg/200 mL premix     1,000 mg 200 mL/hr over 60 Minutes Intravenous Every 12 hours 10/20/2017 1631 11/04/2017 2246   10/11/2017 0900  vancomycin (VANCOCIN) IVPB 1000 mg/200 mL premix     1,000 mg 200 mL/hr over 60 Minutes Intravenous To Surgery 10/29/2017 0857 10/11/2017 0957  10/17/17 0800  vancomycin (VANCOCIN) IVPB 750 mg/150 ml premix  Status:  Discontinued     750 mg 150 mL/hr over 60 Minutes Intravenous Every 24 hours 10/16/17 1220 10/17/17 1023   10/16/17 1800  anidulafungin (ERAXIS) 100 mg in sodium chloride 0.9 % 100 mL IVPB     100 mg 78 mL/hr over 100 Minutes Intravenous Every 24 hours 10/15/17 1657     10/16/17 0600  vancomycin (VANCOCIN) IVPB 1000 mg/200 mL premix  Status:  Discontinued     1,000 mg 200 mL/hr over 60 Minutes Intravenous Every 36 hours 10/24/2017 2308 10/16/17 1220   10/15/17 1800  anidulafungin (ERAXIS) 200 mg in sodium chloride 0.9 % 200 mL IVPB     200 mg 78 mL/hr  over 200 Minutes Intravenous  Once 10/15/17 1657 10/15/17 2148   10/25/2017 2330  piperacillin-tazobactam (ZOSYN) IVPB 3.375 g  Status:  Discontinued     3.375 g 100 mL/hr over 30 Minutes Intravenous Every 8 hours 10/17/2017 2257 10/17/2017 2259   10/28/2017 2315  piperacillin-tazobactam (ZOSYN) IVPB 3.375 g  Status:  Discontinued     3.375 g 12.5 mL/hr over 240 Minutes Intravenous Every 8 hours 11/04/2017 2300 10/26/17 2201   10/13/2017 1930  vancomycin (VANCOCIN) IVPB 1000 mg/200 mL premix     1,000 mg 200 mL/hr over 60 Minutes Intravenous  Once 10/13/2017 1915 10/30/2017 2044   10/13/2017 1530  piperacillin-tazobactam (ZOSYN) IVPB 3.375 g     3.375 g 100 mL/hr over 30 Minutes Intravenous  Once 10/12/2017 1529 10/26/2017 1604      Assessment/Plan: s/p Procedure(s): LEFT VIDEO ASSISTED THORACOSCOPY (VATS) for MEDISTINAL DRAINAGE (Left) Cont  Megace & reglan Mobilize   LOS: 19 days    [name]., Anne Hahn 11/02/2017

## 2017-11-02 NOTE — Progress Notes (Addendum)
PROGRESS NOTE    Danielle [name]  JSH:[id number] DOB: 08-Mar-1946 DOA: 11/01/2017 PCP: Iona Beard, MD    Brief Narrative: 71 y.o.F w/ a Hxof Crohn's disease, hiatal hernia,psoriasis, and SBO who underwent a type IV hiatal hernia repair in October 2018.Postoperatively the patient required endotracheal intubation, mechanical ventilation and the use of pressors. She was extubated on postop day #2. She improved clinically and was on parenteral nutrition, however she developed a pelvic fluid collection which required a drain. On 09/11/2017 she had blood cultures positive for Candida glabrata, and the pelvic abscess culture was positive for C. Albicans and C. Glabrata. She was given treatment withparenteral antifungals. She subsequently improved, was able to ambulate with physical therapy, weaned off of parenteral nutrition and was tolerating oral diet. ID recommended to continue Invanz andErasisfor 2 weeks. A PICC line was placed in her right arm and she was ultimately discharged home.  She returned on 10/01/2017 due to intermittent fevers. She was treated empirically and clinically improved to the point that she was able to be d/c home again on 11/1.  She returned to the ED 11/8 with complaints of persistent nausea associated with emesis. CT abdomen pelvis showed re accumulation of fluid collection in the lower mediastinum, bibasilar pleural effusion, another small complex collection along the posterior margin of the spleen. Patient underwent left video assisted thoracoscopy for mediastinal drainage of mediastinal abscess. ID has been helping with management of IV antibiotics.   Patient continue to spike fevers, persistent nausea and vomiting. CT abdomen pelvis; near complete resolution of mediastinal abscess. Stable small rim enhancing fluid collections along posterosuperior margin of the spleen and pelvic cul-de-sac, suspicious for abscesses. She underwent  CT guide placement of 10 Fr  drain in anterior abdominal abscess on 11-23. [name] Fluid growing yeast and Gram positive cocci in pairs. Started on Zyvox. WBC now started to decreased, still febrile.   Palliative care team met with patient and her family. Patients wants to continue with aggressive care.    Assessment & Plan:   Principal Problem:   Septic shock ([place]) Active Problems:   Leukocytosis   Perianal Crohn's disease, with fistula ([place])   Acute renal failure superimposed on stage 3 chronic kidney disease (HCC)   Hiatal hernia   Immunosuppressed status (HCC)   Chest tube in place   Hypotension   Vomiting in adult patient   S/P thoracentesis   Anasarca   DVT (deep venous thrombosis) (HCC)   Protein-calorie malnutrition, moderate (HCC)   Mediastinal abscess (HCC)   Mediastinal post-op seromas   Pleural effusion, left   Patient's noncompliance with intervention / medical advice    Dehydration   Acute embolism and thrombosis of left axillary vein (HCC)   Goals of care, counseling/discussion   Palliative care encounter   Fever   Intra-abdominal infection   Hemoptysis   Abscess   Intractable vomiting with nausea   SevereSepsis/Mediastinal abscess; Patient underwent esophagogram showing 2.5 cm area of extrinsic compression upon esophagus, underwent also thoracentesis. Case was reviewed by IR and gastroenterologist, recommendation was for CVTS consult.  -Patient underwent VAST drainage 11-15. -S/P VATS drainage on 11/15. Patient continue to spike fever, leukocytosis. .  Repeated CT scan 11-19; Ill-defined multiloculated rim enhancing fluid collection and extensive edema in posterior mediastinum inferior to mediastinal drain and anterior to aorta, probably representing the residual of drained abscess. Discrete 14 mm abscess present in the right posterior mediastinum. Multiloculated rim enhancing fluid collection surrounding the spleen and extending into left pericolic gutter,  likely abscess. Small bilateral  pleural effusions with left pleural drain. Stable dependent lower lobe consolidations may represent atelectasis or pneumonia. -Per CVTS mediastinal drain in good position, still has phlegmon in the area.  -CT abdomen pelvis; near complete resolution of mediastinal abscess. Stable small rim enhancing fluid collections along posterosuperior margin of the spleen and pelvic cul-de-sac, suspicious for abscesses. -IV meropenem and Eraxis.  -underwent CT guide placement of 10 Fr drain in anterior abdominal abscess. Fluid growing yeast and Gram positive cocci in pairs.  Started on Zyvox 11-25. WBC decrease from 30---to 20--19 continue to spike fever. Culture still pending.   Right side pleural effusion;  Patient agree to proceed with thoracentesis but tomorrow.  [place] perform not significant fluid to be removed.   Persistent fevers, leukocytosis;  Multifactorial, abdominal abscess, mediastinal abscess.   Type IV hiatal hernia repair in October 2018. Post operative pelvic abscess;  -CT chest 11-19. For Multiloculated rim enhancing fluid collection surrounding the spleen and extending into left pericolic gutter, likely abscess. -CT abdomen and pelvis;  near complete resolution of mediastinal abscess. Stable small rim enhancing fluid collections along posterosuperior margin of the spleen and pelvic cul-de-sac, suspicious for abscesses. -underwent CT guide placement of 10 Fr drain in anterior abdominal abscess. Fluid growing yeast and Gram positive cocci in pairs. Culture still pending.  Started Zyvox. continue  Known candida galbatra infection; culture 10-12, pelvis abscess; on Eraxis. ID following  Repeated blood culture 11-25 no growth to date  History of Crohns/ chronic diarrhea  C diff 11-10 was negative.  CT abdomen with Mild increase in wall thickening and adjacent inflammatory changes involving the distal ileum, with mild dilatation of several small bowel loops proximal to this site. This is  consistent with Crohn's disease and stricture formation. -C diff negative.  -No candidate for treatment for Crohn's  per GI.   Urinary Retention; continue with foley catheter; will need voiding trial.   Nutrition;  Nutrition started on TPN.  Patient with a recent fungemia with candida glabrata and albicans.  Per Dr Fredderick Erb ok to continue with TPN for now.  Per Dr Rosendo Gros; oral trial. He doubt G tube will be stable.  Follow Sx recommendation regarding Jejunostomy tube  Anemia; no further hematemesis.  Hb slowly trending down. Monitor daily   Chronic diastolic CHF Compensated.   Recent Hematemesis/hemoptysis -stopped IV heparin  -lateral upper extremity DVT 09/2017.  -positive DVT in the left radial vein at this time -no recurrent hematemesis or hemoptysis. IV Protonix drip -CT scan of the chest shows evidence of esophageal wall thickening consistent with esophagitis -GI following.   RUE DVT; holding heparin due to hematemesis.  When hb stable plan to resume heparin if clear by GI.       DVT prophylaxis: SCD.  Code Status: full code.  Family Communication: care discussed with patient.  Disposition Plan: remain in patient for IV antibiotics.    Consultants:   General surgery  CVTS  GI   Procedures: events; Significant Events: 11/8 readmit 11/11 PICC placed 11/12 thoracentesis 11/5 to OR at [facility] for L VATS for mediastinal drainage     Antimicrobials; antifungal  Erasix 10-16-2017  Meropenem 10-27-2017     Subjective: Vomited this am.  Mild abdominal pain   Objective: Vitals:   11/02/17 0028 11/02/17 0332 11/02/17 0419 11/02/17 0613  BP: (!) 162/66 118/67    Pulse: (!) 125 (!) 117    Resp: (!) 37 (!) 33 (!) 28   Temp: (!) 102.8 F (  39.3 C) 98.2 F (36.8 C)  99.7 F (37.6 C)  TempSrc: Rectal Oral  Rectal  SpO2: 99% 97% 97%   Weight:      Height:        Intake/Output Summary (Last 24 hours) at 11/02/2017 0821 Last data filed at  11/02/2017 0556 Gross per 24 hour  Intake 2470 ml  Output 2391 ml  Net 79 ml   Filed Weights   10/15/17 1449 10/20/17 1600 10/20/2017 0500  Weight: 55.9 kg (123 lb 3.8 oz) 55.3 kg (121 lb 14.6 oz) 55.8 kg (123 lb 0.3 oz)    Examination:  General exam: Chronic ill appearing.  Respiratory system;bilateral ronchus,  drain left side chest.  Cardiovascular system: S 1, S 2 RRR,  Gastrointestinal system: BS present, soft. midline abdominal wound healing. Drain mid  quadrant.  Central nervous system: Alert, Extremities: no edema.    Data Reviewed: I have personally reviewed following labs and imaging studies  CBC: Recent Labs  Lab 10/27/17 0500  10/29/17 0315 10/30/17 0845 10/31/17 0429 11/01/17 0230 11/02/17 0417  WBC 19.5*   < > 20.6* 24.1* 34.8* 20.7* 19.5*  NEUTROABS 13.1*  --   --   --   --  14.5*  --   HGB 8.9*   < > 8.9* 10.1* 9.8* 9.1* 8.7*  HCT 28.1*   < > 28.0* 31.2* 30.7* 27.5* 27.4*  MCV 85.4   < > 84.6 84.6 84.1 84.6 84.6  PLT 266   < > 286 361 364 331 329   < > = values in this interval not displayed.   Basic Metabolic Panel: Recent Labs  Lab 10/28/17 0500 10/29/17 0315 10/30/17 0845 10/30/17 2156 10/31/17 0429 11/01/17 0230 11/02/17 0417  NA 135 135 132* 131*  --  133* 133*  K 4.5 4.6 5.5* 5.1  --  3.7 3.2*  CL 107 106 105 107  --  108 107  CO2 22 22 19* 17*  --  19* 20*  GLUCOSE 110* 109* 83 129*  --  109* 101*  BUN 18 18 21* 18  --  22* 20  CREATININE 0.87 0.88 0.86 0.95  --  0.87 0.77  CALCIUM 7.8* 8.2* 8.4* 8.5*  --  8.2* 8.2*  MG 1.9  --   --  1.9  --  2.0 2.0  PHOS 4.2  --   --   --  3.9 4.6 3.6   GFR: Estimated Creatinine Clearance: 58.5 mL/min (by C-G formula based on SCr of 0.77 mg/dL). Liver Function Tests: Recent Labs  Lab 10/28/17 0500 11/01/17 0230  AST 21 52*  ALT 16 37  ALKPHOS 120 149*  BILITOT 0.3 0.5  PROT 6.0* 7.2  ALBUMIN 1.3* 1.5*   No results for input(s): LIPASE, AMYLASE in the last 168 hours. No results for  input(s): AMMONIA in the last 168 hours. Coagulation Profile: Recent Labs  Lab 10/29/17 1115  INR 1.24   Cardiac Enzymes: No results for input(s): CKTOTAL, CKMB, CKMBINDEX, TROPONINI in the last 168 hours. BNP (last 3 results) No results for input(s): PROBNP in the last 8760 hours. HbA1C: No results for input(s): HGBA1C in the last 72 hours. CBG: Recent Labs  Lab 11/01/17 0739 11/01/17 1615 11/01/17 2000 11/02/17 0150 11/02/17 0743  GLUCAP 84 94 101* 110* 99   Lipid Profile: Recent Labs    11/01/17 0230  TRIG 117   Thyroid Function Tests: No results for input(s): TSH, T4TOTAL, FREET4, T3FREE, THYROIDAB in the last 72 hours. Anemia Panel:  No results for input(s): VITAMINB12, FOLATE, FERRITIN, TIBC, IRON, RETICCTPCT in the last 72 hours. Sepsis Labs: Recent Labs  Lab 10/30/17 2320 10/31/17 0145  LATICACIDVEN 1.0 1.1    Recent Results (from the past 240 hour(s))  Culture, blood (routine x 2)     Status: None   Collection Time: 10/24/17 12:06 AM  Result Value Ref Range Status   Specimen Description BLOOD LEFT FOOT  Final   Special Requests   Final    BOTTLES DRAWN AEROBIC AND ANAEROBIC Blood Culture adequate volume   Culture NO GROWTH 5 DAYS  Final   Report Status 10/29/2017 FINAL  Final  Culture, blood (routine x 2)     Status: None   Collection Time: 10/24/17 12:16 AM  Result Value Ref Range Status   Specimen Description BLOOD RIGHT FOOT  Final   Special Requests   Final    BOTTLES DRAWN AEROBIC ONLY Blood Culture results may not be optimal due to an inadequate volume of blood received in culture bottles   Culture NO GROWTH 5 DAYS  Final   Report Status 10/29/2017 FINAL  Final  Fungus culture, blood     Status: None   Collection Time: 10/24/17 12:16 AM  Result Value Ref Range Status   Specimen Description BLOOD LEFT FOOT  Final   Special Requests   Final    BOTTLES DRAWN AEROBIC AND ANAEROBIC Blood Culture adequate volume   Culture NO GROWTH 7 DAYS NO  FUNGUS ISOLATED  Final   Report Status 10/31/2017 FINAL  Final  Culture, blood (routine x 2)     Status: None   Collection Time: 10/24/17  3:50 PM  Result Value Ref Range Status   Specimen Description BLOOD RIGHT FOOT  Final   Special Requests IN PEDIATRIC BOTTLE Blood Culture adequate volume  Final   Culture NO GROWTH 5 DAYS  Final   Report Status 10/29/2017 FINAL  Final  Culture, blood (routine x 2)     Status: None   Collection Time: 10/27/17  1:19 PM  Result Value Ref Range Status   Specimen Description BLOOD RIGHT HAND  Final   Special Requests IN PEDIATRIC BOTTLE Blood Culture adequate volume  Final   Culture NO GROWTH 5 DAYS  Final   Report Status 11/01/2017 FINAL  Final  Culture, blood (routine x 2)     Status: None   Collection Time: 10/27/17  1:24 PM  Result Value Ref Range Status   Specimen Description BLOOD RIGHT HAND  Final   Special Requests IN PEDIATRIC BOTTLE Blood Culture adequate volume  Final   Culture NO GROWTH 5 DAYS  Final   Report Status 11/01/2017 FINAL  Final  Culture, Urine     Status: None   Collection Time: 10/27/17  4:44 PM  Result Value Ref Range Status   Specimen Description URINE, RANDOM  Final   Special Requests NONE  Final   Culture NO GROWTH  Final   Report Status 10/28/2017 FINAL  Final  Aerobic/Anaerobic Culture (surgical/deep wound)     Status: None (Preliminary result)   Collection Time: 10/29/17  4:17 PM  Result Value Ref Range Status   Specimen Description ABSCESS  Final   Special Requests ABDOMEN  Final   Gram Stain   Final    ABUNDANT WBC PRESENT, PREDOMINANTLY PMN RARE GRAM POSITIVE COCCI IN PAIRS RARE BUDDING YEAST SEEN    Culture   Final    NO GROWTH 2 DAYS NO ANAEROBES ISOLATED; CULTURE IN PROGRESS FOR 5  DAYS   Report Status PENDING  Incomplete  C difficile quick scan w PCR reflex     Status: None   Collection Time: 10/29/17  4:25 PM  Result Value Ref Range Status   C Diff antigen NEGATIVE NEGATIVE Final   C Diff toxin  NEGATIVE NEGATIVE Final   C Diff interpretation No C. difficile detected.  Final  Culture, blood (x 2)     Status: None (Preliminary result)   Collection Time: 10/31/17  2:06 AM  Result Value Ref Range Status   Specimen Description BLOOD LEFT FOOT  Final   Special Requests IN PEDIATRIC BOTTLE Blood Culture adequate volume  Final   Culture NO GROWTH 1 DAY  Final   Report Status PENDING  Incomplete  Culture, blood (x 2)     Status: None (Preliminary result)   Collection Time: 10/31/17  2:12 AM  Result Value Ref Range Status   Specimen Description BLOOD RIGHT FOOT  Final   Special Requests IN PEDIATRIC BOTTLE Blood Culture adequate volume  Final   Culture NO GROWTH 1 DAY  Final   Report Status PENDING  Incomplete         Radiology Studies: [place] Chest (pleural Effusion)  Result Date: 10/31/2017 CLINICAL DATA:  71 year old female with referral for thoracentesis. EXAM: CHEST ULTRASOUND COMPARISON:  10/28/2017 FINDINGS: Ultrasound survey demonstrates small fluid at the right lung base. Thoracentesis deferred at this time. IMPRESSION: Ultrasound survey demonstrates small fluid at the right lung base. Electronically Signed   By: Corrie Mckusick D.O.   On: 10/31/2017 10:51        Scheduled Meds: . chlorhexidine  15 mL Mouth Rinse BID  . feeding supplement  1 Container Oral BID BM  . fentaNYL   Intravenous Q4H  . mouth rinse  15 mL Mouth Rinse q12n4p  . megestrol  400 mg Oral BID  . metoCLOPramide (REGLAN) injection  5 mg Intravenous Q6H  . pantoprazole  40 mg Intravenous Q12H  . promethazine  6.25 mg Intravenous TID AC  . sodium chloride flush  5 mL Intravenous Q8H   Continuous Infusions: . anidulafungin Stopped (11/01/17 1945)  . linezolid (ZYVOX) IV Stopped (11/01/17 2321)  . meropenem (MERREM) IV Stopped (11/02/17 0556)  . potassium chloride 10 mEq (11/02/17 0807)  . TPN ADULT (ION) 70 mL/hr at 11/01/17 1742     LOS: 19 days    Time spent: 35 minutes.     Elmarie Shiley, MD [facility] Pager [phone]  If 7PM-7AM, please contact night-coverage [email or web address] Password [email or web address] 11/02/2017, 8:21 AM

## 2017-11-02 NOTE — Progress Notes (Signed)
Referring Physician(s): Dr. Donne Hazel  Supervising Physician: Marybelle Killings  Patient Status:  [facility] - In-pt  Chief Complaint: Abdominal abscess  Subjective: Shivering, feverish at time of visit.   Allergies: Zithromax [azithromycin]; Aspirin; Ibuprofen; Iron; and Sulfa antibiotics  Medications: Prior to Admission medications   Medication Sig Start Date End Date Taking? Authorizing Provider  albuterol (PROVENTIL HFA;VENTOLIN HFA) 108 (90 Base) MCG/ACT inhaler Inhale 2 puffs into the lungs every 6 (six) hours as needed for wheezing. 12/09/15  Yes Rama, Venetia Maxon, MD  apixaban (ELIQUIS) 5 MG TABS tablet Take 10 mg twice a day for 7 days and then start using 5 mg BID. 10/07/17  Yes Barton Dubois, MD  cyanocobalamin (,VITAMIN B-12,) 1000 MCG/ML injection INJECT 1000 MCG IM ONCE MONTHLY 07/09/17  Yes Rehman, Mechele Dawley, MD  dicyclomine (BENTYL) 10 MG/5ML syrup TAKE 10 MLS (20 MG TOTAL) BY MOUTH 3 (THREE) TIMES DAILY AS NEEDED. Patient taking differently: Take 20 mg 3 (three) times daily as needed by mouth (stomach cramps).  07/26/17  Yes Rehman, Mechele Dawley, MD  diphenoxylate-atropine (LOMOTIL) 2.5-0.025 MG tablet TAKE 1 TABLET BY MOUTH FOUR TIMES A DAY AS NEEDED Patient taking differently: Take one tablet twice daily. 06/28/17  Yes Setzer, Terri L, NP  feeding supplement (BOOST / RESOURCE BREEZE) LIQD Take 1 Container by mouth 3 (three) times daily between meals. 10/07/17  Yes Barton Dubois, MD  furosemide (LASIX) 20 MG tablet Take 1 tablet (20 mg total) by mouth daily as needed (Take for weight gain of more than 2 pounds in a day or 5 pounds in 2 days.). 06/16/17  Yes Lavina Hamman, MD  HYDROcodone-acetaminophen (NORCO) 7.5-325 MG tablet Take 1 tablet by mouth every 6 (six) hours as needed for moderate pain or severe pain. 09/26/17  Yes Michael Boston, MD  magnesium oxide (MAG-OX) 400 (241.3 Mg) MG tablet Take 1 tablet (400 mg total) by mouth daily. 10/07/17  Yes Barton Dubois, MD  nystatin  (NYSTATIN) powder Apply 1 g topically 2 (two) times daily as needed (for itching/irritation).   Yes [provider]  pantoprazole (PROTONIX) 40 MG tablet Take 1 tablet (40 mg total) by mouth daily. 06/16/17  Yes Nita Sells, MD  prochlorperazine (COMPAZINE) 10 MG tablet Take 1 tablet (10 mg total) by mouth every 6 (six) hours as needed for refractory nausea / vomiting. 10/07/17  Yes Barton Dubois, MD  sodium bicarbonate 650 MG tablet Take 650 mg by mouth 2 (two) times daily.   Yes [provider]  spironolactone (ALDACTONE) 50 MG tablet Take 50 mg by mouth daily.  06/23/17  Yes [provider]  acetaminophen (TYLENOL) 325 MG tablet Take 2 tablets (650 mg total) by mouth every 6 (six) hours as needed for mild pain or moderate pain. Patient not taking: Reported on 11/04/2017 10/07/17   Barton Dubois, MD  promethazine (PHENERGAN) 25 MG tablet TAKE 1 TABLET (25 MG TOTAL) BY MOUTH 2 (TWO) TIMES DAILY AS NEEDED FOR NAUSEA OR VOMITING. Patient not taking: Reported on 10/16/2017 09/29/17   Rogene Houston, MD     Vital Signs: BP (!) 151/78 (BP Location: Left Leg)   Pulse (!) 112   Temp 99.5 F (37.5 C) (Oral)   Resp (!) 26   Ht 5' 4.5" (1.638 m)   Wt 123 lb 0.3 oz (55.8 kg)   SpO2 100%   BMI 20.79 kg/m   Physical Exam  Abd:  Soft, tender, unable to assess insertion site at time of visit, bloody  output in collection bulb  Imaging: [place] Chest (pleural Effusion)  Result Date: 10/31/2017 CLINICAL DATA:  71 year old female with referral for thoracentesis. EXAM: CHEST ULTRASOUND COMPARISON:  10/28/2017 FINDINGS: Ultrasound survey demonstrates small fluid at the right lung base. Thoracentesis deferred at this time. IMPRESSION: Ultrasound survey demonstrates small fluid at the right lung base. Electronically Signed   By: Corrie Mckusick D.O.   On: 10/31/2017 10:51   Ct Image Guided Drainage By Percutaneous Catheter  Result Date: 10/29/2017 INDICATION: 72 year old with  a persistent anterior abdominal fluid collection near the falciform ligament of the liver. This fluid collection has been present for quite a while but the patient continues to have fevers. Complex medical history including Crohn's disease and abdominal abscess collections. EXAM: CT GUIDED DRAINAGE OF ANTERIOR ABDOMINAL ABSCESS MEDICATIONS: None ANESTHESIA/SEDATION: 4 mg IV Versed 20 mcg IV Fentanyl Moderate Sedation Time:  23 minutes The patient was continuously monitored during the procedure by the interventional radiology nurse under my direct supervision. COMPLICATIONS: None immediate. TECHNIQUE: Informed written consent was obtained from the patient after a thorough discussion of the procedural risks, benefits and alternatives. All questions were addressed. Maximal Sterile Barrier Technique was utilized including caps, mask, sterile gowns, sterile gloves, sterile drape, hand hygiene and skin antiseptic. A timeout was performed prior to the initiation of the procedure. PROCEDURE: Patient was placed supine in the CT scanner. CT images through the anterior abdomen were obtained. Anterior abdomen was prepped with chlorhexidine. Sterile field was created. Skin was anesthetized with 1% lidocaine. Using CT guidance, 18 gauge trocar needle was directed into the small fluid collection along the inferior left hepatic lobe. Thick brown fluid was aspirated. A stiff Amplatz wire was advanced into the collection. The tract was dilated to accommodate a 10.2 [place] multipurpose drain. Catheter was attached to a suction bulb. Catheter was sutured to skin. FINDINGS: Small fluid collection along the inferior aspect of the left hepatic lobe. Thick brown fluid was aspirated from the collection. 10 French drain was placed. IMPRESSION: CT-guided placement of a drainage catheter within the small fluid collection along the inferior aspect of the left hepatic lobe. Fluid was sent for culture. Electronically Signed   By: Markus Daft M.D.    On: 10/29/2017 17:36    Labs:  CBC: Recent Labs    10/30/17 0845 10/31/17 0429 11/01/17 0230 11/02/17 0417  WBC 24.1* 34.8* 20.7* 19.5*  HGB 10.1* 9.8* 9.1* 8.7*  HCT 31.2* 30.7* 27.5* 27.4*  PLT 361 364 331 329    COAGS: Recent Labs    05/07/17 0244 09/17/17 1021 11/04/2017 1405 10/15/17 0400 10/29/17 1115  INR  --  1.24 1.69 1.52 1.24  APTT 21*  --   --   --   --     BMP: Recent Labs    10/30/17 0845 10/30/17 2156 11/01/17 0230 11/02/17 0417  NA 132* 131* 133* 133*  K 5.5* 5.1 3.7 3.2*  CL 105 107 108 107  CO2 19* 17* 19* 20*  GLUCOSE 83 129* 109* 101*  BUN 21* 18 22* 20  CALCIUM 8.4* 8.5* 8.2* 8.2*  CREATININE 0.86 0.95 0.87 0.77  GFRNONAA >60 60* >60 >60  GFRAA >60 >60 >60 >60    LIVER FUNCTION TESTS: Recent Labs    10/23/17 0513 10/25/17 0005 10/28/17 0500 11/01/17 0230  BILITOT 0.4 0.3 0.3 0.5  AST 28 32 21 52*  ALT 27 28 16  37  ALKPHOS 111 145* 120 149*  PROT 6.9 6.0* 6.0* 7.2  ALBUMIN 1.6* 1.5*  1.3* 1.5*    Assessment and Plan: Abdominal abscesses, s/p hepatic abscess drain placement 11/23 WBC improved, but remains elevated.  Continues with fevers.  Output was serous yesterday, but is now bloody with continued fevers.  Will discuss with Dr. Barbie Banner, but would favor leaving drain in. CT imaging as needed or monitoring or new symptoms.  IR to follow.   Electronically Signed: Docia Barrier, PA 11/02/2017, 11:23 AM   I spent a total of 15 Minutes at the the patient's bedside AND on the patient's hospital floor or unit, greater than 50% of which was counseling/coordinating care for intra-abdominal abscess.

## 2017-11-02 NOTE — Care Management Note (Signed)
Case Management Note  Patient Details  Name: Danielle [name] MRN: [id number] Date of Birth: 1946/11/04  Subjective/Objective:     Pt s/p VATS, mediastinal mass requiring perc drain                Action/Plan:  PTA from home.  CTs remain in and WBC climbing.  Palliative involved - pt continues to request aggressive care.  TPN discontinued due to ongoing fevers.  Pt will need PT eval when stable.  CM will continue to follow for discharge needs   Expected Discharge Date:  (unknown)               Expected Discharge Plan:     In-House Referral:     Discharge planning Services  CM Consult  Post Acute Care Choice:    Choice offered to:     DME Arranged:    DME Agency:     HH Arranged:    HH Agency:     Status of Service:  In process, will continue to follow  If discussed at Long Length of Stay Meetings, dates discussed:    Additional Comments:  Maryclare Labrador, RN 11/02/2017, 3:07 PM

## 2017-11-02 NOTE — Progress Notes (Addendum)
[facility] for Infectious Disease    Date of Admission:  11/04/2017   Total days of antibiotics 18        Day 18 anidula        Day 7/3 meropenem/anidula   ID: Danielle [name] is a 71 y.o. female with  type IV hiatal hernia repair on 64/3 complicated by fungemia, pelvic abscess as well as mediastinal abscess s/p VATS on 11/15, currently maintained on TPN   Principal Problem:   Septic shock ([place]) Active Problems:   Leukocytosis   Perianal Crohn's disease, with fistula ([place])   Acute renal failure superimposed on stage 3 chronic kidney disease ([place])   Hiatal hernia   Immunosuppressed status ([place])   Chest tube in place   Hypotension   Vomiting in adult patient   S/P thoracentesis   Anasarca   DVT (deep venous thrombosis) ([place])   Protein-calorie malnutrition, moderate (HCC)   Mediastinal abscess (HCC)   Mediastinal post-op seromas   Pleural effusion, left   Patient's noncompliance with intervention / medical advice    Dehydration   Acute embolism and thrombosis of left axillary vein ([place])   Goals of care, counseling/discussion   Palliative care encounter   Fever   Intra-abdominal infection   Hemoptysis   Abscess   Intractable vomiting with nausea    Subjective: Having rigors with associated temp of 103F feeling poorly  Medications:  . chlorhexidine  15 mL Mouth Rinse BID  . feeding supplement  1 Container Oral BID BM  . fentaNYL   Intravenous Q4H  . mouth rinse  15 mL Mouth Rinse q12n4p  . megestrol  400 mg Oral BID  . metoCLOPramide (REGLAN) injection  5 mg Intravenous Q6H  . pantoprazole  40 mg Intravenous Q12H  . promethazine  6.25 mg Intravenous TID AC  . sodium chloride flush  5 mL Intravenous Q8H    Objective: Vital signs in last 24 hours: Temp:  [98.2 F (36.8 C)-102.8 F (39.3 C)] 99.7 F (37.6 C) (11/27 1315) Pulse Rate:  [102-125] 108 (11/27 1315) Resp:  [24-41] 33 (11/27 1315) BP: (95-162)/(54-78) 132/78 (11/27 1315) SpO2:  [90 %-100 %]  100 % (11/27 1315) Physical Exam  Constitutional:  oriented to person, place, and time. appears weak, chronically ill. Shaking from rigors.  HENT: [place]/AT, PERRLA, no scleral icterus Mouth/Throat: Oropharynx is clear and moist. No oropharyngeal exudate.  Cardiovascular: tachycardia, regular rhythm and normal heart sounds. Chest tube in place Pulmonary/Chest: Effort normal and breath sounds normal. No respiratory distress.  has no wheezes.  Abdominal: Soft. Bowel sounds are normal.  exhibits no distension. There is no tenderness. abd drain in place with serosanginous drainage Skin: Skin is warm and dry. No rash noted. No erythema.  Psychiatric: a normal mood and affect.  behavior is normal.    Lab Results Recent Labs    11/01/17 0230 11/02/17 0417  WBC 20.7* 19.5*  HGB 9.1* 8.7*  HCT 27.5* 27.4*  NA 133* 133*  K 3.7 3.2*  CL 108 107  CO2 19* 20*  BUN 22* 20  CREATININE 0.87 0.77   Liver Panel Recent Labs    11/01/17 0230  PROT 7.2  ALBUMIN 1.5*  AST 52*  ALT 37  ALKPHOS 149*  BILITOT 0.5    Microbiology: 11/25 blood cx ngtd 11/23 abd wound cx budding yeast and GPC in prs on gram stain Studies/Results: No results found.   Assessment/Plan: Mediastinal and intra-abdominal infection 2/2 hiatal hernia repair = continue on  broad spectrum abtx including anidulafungin and linezolid. Concern that she is having fever due to beta-lactams and carbapenems vs source control  FUO = concern that this may be drug fever. We will stop meropenem and change to aztreonam plus metronidazole.  Will add differential on her.  Baxter Flattery [facility] for Infectious Diseases Cell: [phone] Pager: [phone]  11/02/2017, 3:20 PM

## 2017-11-03 LAB — BASIC METABOLIC PANEL
ANION GAP: 6 (ref 5–15)
Anion gap: 9 (ref 5–15)
BUN: 17 mg/dL (ref 6–20)
BUN: 19 mg/dL (ref 6–20)
CHLORIDE: 113 mmol/L — AB (ref 101–111)
CO2: 15 mmol/L — ABNORMAL LOW (ref 22–32)
CO2: 18 mmol/L — AB (ref 22–32)
Calcium: 7.9 mg/dL — ABNORMAL LOW (ref 8.9–10.3)
Calcium: 8 mg/dL — ABNORMAL LOW (ref 8.9–10.3)
Chloride: 109 mmol/L (ref 101–111)
Creatinine, Ser: 0.83 mg/dL (ref 0.44–1.00)
Creatinine, Ser: 0.84 mg/dL (ref 0.44–1.00)
GFR calc Af Amer: >60 mL/min (ref >60)
GFR calc non Af Amer: >60 mL/min (ref >60)
GFR calc non Af Amer: >60 mL/min (ref >60)
Glucose, Bld: 102 mg/dL — ABNORMAL HIGH (ref 65–99)
Glucose, Bld: 88 mg/dL (ref 65–99)
POTASSIUM: 3.3 mmol/L — AB (ref 3.5–5.1)
Potassium: 4.4 mmol/L (ref 3.5–5.1)
Sodium: 133 mmol/L — ABNORMAL LOW (ref 135–145)
Sodium: 137 mmol/L (ref 135–145)

## 2017-11-03 LAB — CBC
HCT: 29.4 % — ABNORMAL LOW (ref 36.0–46.0)
Hemoglobin: 9.5 g/dL — ABNORMAL LOW (ref 12.0–15.0)
MCH: 27.3 pg (ref 26.0–34.0)
MCHC: 32.3 g/dL (ref 30.0–36.0)
MCV: 84.5 fL (ref 78.0–100.0)
Platelets: 345 10*3/uL (ref 150–400)
RBC: 3.48 MIL/uL — ABNORMAL LOW (ref 3.87–5.11)
RDW: 19 % — ABNORMAL HIGH (ref 11.5–15.5)
WBC: 29.3 10*3/uL — ABNORMAL HIGH (ref 4.0–10.5)

## 2017-11-03 LAB — CBC WITH DIFFERENTIAL/PLATELET
BASOS PCT: 0 %
Basophils Absolute: 0 10*3/uL (ref 0.0–0.1)
Eosinophils Absolute: 0.1 10*3/uL (ref 0.0–0.7)
Eosinophils Relative: 0 %
HEMATOCRIT: 27.9 % — AB (ref 36.0–46.0)
HEMOGLOBIN: 8.9 g/dL — AB (ref 12.0–15.0)
LYMPHS ABS: 1.4 10*3/uL (ref 0.7–4.0)
LYMPHS PCT: 6 %
MCH: 26.6 pg (ref 26.0–34.0)
MCHC: 31.9 g/dL (ref 30.0–36.0)
MCV: 83.5 fL (ref 78.0–100.0)
Monocytes Absolute: 1.4 10*3/uL — ABNORMAL HIGH (ref 0.1–1.0)
Monocytes Relative: 6 %
NEUTROS ABS: 21.4 10*3/uL — AB (ref 1.7–7.7)
NEUTROS PCT: 88 %
Platelets: 295 10*3/uL (ref 150–400)
RBC: 3.34 MIL/uL — ABNORMAL LOW (ref 3.87–5.11)
RDW: 18.6 % — ABNORMAL HIGH (ref 11.5–15.5)
WBC: 24.3 10*3/uL — ABNORMAL HIGH (ref 4.0–10.5)

## 2017-11-03 LAB — PHOSPHORUS: PHOSPHORUS: 3.9 mg/dL (ref 2.5–4.6)

## 2017-11-03 LAB — LACTIC ACID, PLASMA
Lactic Acid, Venous: 1.7 mmol/L (ref 0.5–1.9)
Lactic Acid, Venous: 2.2 mmol/L (ref 0.5–1.9)

## 2017-11-03 LAB — GLUCOSE, CAPILLARY: Glucose-Capillary: 71 mg/dL (ref 65–99)

## 2017-11-03 LAB — MAGNESIUM: Magnesium: 1.7 mg/dL (ref 1.7–2.4)

## 2017-11-03 MED ORDER — TRAVASOL 10 % IV SOLN
INTRAVENOUS | Status: AC
  Administered 2017-11-03: 17:00:00 via INTRAVENOUS
  Filled 2017-11-03: qty 600

## 2017-11-03 MED ORDER — POTASSIUM CHLORIDE 10 MEQ/50ML IV SOLN
10.0000 meq | INTRAVENOUS | Status: AC
  Administered 2017-11-03 (×5): 10 meq via INTRAVENOUS
  Filled 2017-11-03: qty 50

## 2017-11-03 MED ORDER — MEGESTROL ACETATE 400 MG/10ML PO SUSP
400.0000 mg | Freq: Three times a day (TID) | ORAL | Status: DC
  Administered 2017-11-03 (×2): 400 mg via ORAL
  Filled 2017-11-03 (×5): qty 10

## 2017-11-03 MED ORDER — ACETAMINOPHEN 10 MG/ML IV SOLN
1000.0000 mg | Freq: Four times a day (QID) | INTRAVENOUS | Status: DC | PRN
  Administered 2017-11-03 – 2017-11-04 (×3): 1000 mg via INTRAVENOUS
  Filled 2017-11-03 (×3): qty 100

## 2017-11-03 NOTE — Progress Notes (Signed)
Patient had sitter call her sister and she told her sister on the phone that she wants to kill herself. Notified Dr Hilbert Bible.  Sister called concerned about the patient. Discussed with her suicide  Precautions. Sister verb understanding. Sister is coming to the hospital to talk with patient.

## 2017-11-03 NOTE — Progress Notes (Signed)
[facility] for Infectious Disease    Date of Admission:  10/17/2017   Total days of antibiotics 19        Day 19 anidula        Day 2 aztreo/metronidazole        Day 4 linezolid   ID: Danielle [name] is a 71 y.o. female with  type IV hiatal hernia repair on 78/2 complicated by fungemia, pelvic abscess as well as mediastinal abscess s/p VATS on 11/15, currently maintained on TPN   Principal Problem:   Septic shock ([place]) Active Problems:   Leukocytosis   Perianal Crohn's disease, with fistula ([place])   Acute renal failure superimposed on stage 3 chronic kidney disease ([place])   Hiatal hernia   Immunosuppressed status ([place])   Chest tube in place   Hypotension   Vomiting in adult patient   S/P thoracentesis   Anasarca   DVT (deep venous thrombosis) (HCC)   Protein-calorie malnutrition, moderate (HCC)   Mediastinal abscess (HCC)   Mediastinal post-op seromas   Pleural effusion, left   Patient's noncompliance with intervention / medical advice    Dehydration   Acute embolism and thrombosis of left axillary vein (HCC)   Goals of care, counseling/discussion   Palliative care encounter   Fever   Intra-abdominal infection   Hemoptysis   Abscess   Intractable vomiting with nausea    Subjective:having high fevers of 10.4.33F last night but has had afebrile this morning, after receiving antipyretics. Feeling nauseated from iv abtx and iv potassium  Medications:  . chlorhexidine  15 mL Mouth Rinse BID  . feeding supplement  1 Container Oral BID BM  . fentaNYL   Intravenous Q4H  . mouth rinse  15 mL Mouth Rinse q12n4p  . megestrol  400 mg Oral TID  . metoCLOPramide (REGLAN) injection  5 mg Intravenous Q6H  . pantoprazole  40 mg Intravenous Q12H  . promethazine  6.25 mg Intravenous TID AC  . sodium chloride flush  5 mL Intravenous Q8H    Objective: Vital signs in last 24 hours: Temp:  [98.1 F (36.7 C)-104.6 F (40.3 C)] 100.7 F (38.2 C) (11/28 1529) Pulse Rate:   [96-150] 96 (11/28 0726) Resp:  [26-55] 26 (11/28 1200) BP: (119-195)/(69-79) 123/70 (11/28 0726) SpO2:  [96 %-97 %] 96 % (11/28 1200) Physical Exam  Constitutional:  oriented to person, place, and time. appears weak, chronically ill.easily awakened HENT: /AT, PERRLA, no scleral icterus Mouth/Throat: Oropharynx is clear and moist. No oropharyngeal exudate.  Cardiovascular: tachycardia, regular rhythm and normal heart sounds. Chest tube in place Pulmonary/Chest: Effort normal and breath sounds normal. No respiratory distress.  has no wheezes.  Abdominal: Soft. Bowel sounds are normal.  exhibits no distension. There is no tenderness. abd drain in place with serosanginous drainage   Lab Results Recent Labs    11/03/17 0002 11/03/17 0500  WBC 29.3* 24.3*  HGB 9.5* 8.9*  HCT 29.4* 27.9*  NA 133* 137  K 4.4 3.3*  CL 109 113*  CO2 15* 18*  BUN 17 19  CREATININE 0.84 0.83   Liver Panel Recent Labs    11/01/17 0230  PROT 7.2  ALBUMIN 1.5*  AST 52*  ALT 37  ALKPHOS 149*  BILITOT 0.5    Microbiology: 11/25 blood cx ngtd 11/23 abd wound cx budding yeast and GPC in prs on gram stain Studies/Results: No results found.   Assessment/Plan: Mediastinal and intra-abdominal infection 2/2 hiatal hernia repair = continue on broad spectrum  abtx including anidulafungin and linezolid. Concern that she is having fever due to beta-lactams and carbapenems vs source control. She was switched to aztreonam and metronidazole  Gram positive on gram stain are not growing. If anaerobic culture is negative tomorrow, we will d/c linezolid.  FUO = concern that this may be drug fever though it probably is due to source control. Will do a trial run of changing to aztreo and metronidazole.   Will continue to follow fever curve  Nausea = possibly also worsened by SE of metronidazole. Treat with anti-emetics at the moment  [facility] for Infectious Diseases Cell:  [phone] Pager: [phone]  11/03/2017, 4:17 PM

## 2017-11-03 NOTE — Progress Notes (Signed)
Pt refused ABG, Lactic acid 2.2.  C. Bodenheimer NP made aware via text page.

## 2017-11-03 NOTE — Progress Notes (Signed)
Patient's sister is in the room talking and comforting patient. Spoke with patient sister and she is understanding suicide precautions and she expressed appreciation of staff for the support the patient is receiving.

## 2017-11-03 NOTE — Progress Notes (Signed)
Nutrition Follow-up  DOCUMENTATION CODES:   Not applicable  INTERVENTION:   -TPN management per pharmacy -Continue Boost Breeze po BID, each supplement provides 250 kcal and 9 grams of protein  NUTRITION DIAGNOSIS:   Inadequate oral intake related to altered GI function as evidenced by (TPN dependent).  Ongoing  GOAL:   Patient will meet greater than or equal to 90% of their needs  Progressing  MONITOR:   PO intake, Diet advancement, Labs, Weight trends, Skin, I & O's  REASON FOR ASSESSMENT:   Consult New TPN/TNA  ASSESSMENT:   71 y.o. female with medical history significant of Crohn's disease, hiatal hernia, left buttock abscess, psoriasis, small bowel obstruction who has been admitted twice in the past 5 weeks, first from 10/03-10/21 after undergoing type IV hiatal hernia repair. She improved clinically and was on TPN, however she developed a pelvic fluid collection which required a drain placement. She subsequently improved and was weaned off of TPN and was tolerating an oral diet. ID recommended to continue Invanz and Erasis for 2 weeks. A PICC line was placed in her right upper arm and she was subsequently discharged home. She returned on 10/26 due to intermittent fevers. She presented on 11/8 with complaints of persistent nausea associated with about 3 episodes of emesis daily. She has also had loose stools, but stated that this is her baseline since she suffers from Crohn's disease.   11/12- lt thoracentesis (780 ml removed) 11/15- s/p VATS 11/21- PCT c/s; pt refusing TF (including PEG and cortrak) 11/22- per surgery, mediastinal fluid collection nearly resolved per CT 11/23- anterior abdominal abscess drain placed per IR 11/26- Megace started  11/27- TPN decreased in attempt to stimulate appetite  Reviewed CWOCN note from 11/01/17; pt with MASD to buttocks.   Pt in with MD at time of visit.   Case discussed with Vinie Sill, NP with Palliative Care Team. She  reports pt has made small improvements over the past few days. Pt is taking minimal intake off meal trays, but is now asking for Boost Breeze supplements. Pt remains on TPN and refusing PEG or cortrak tube. Per NP, pt likely not a candidate for PEG. Pt still desires aggressive care at this time.   Pt receiving reduced TPN rate of 50 mL/hr over 24 hr to account for addition of Boost Breeze, attempt to stimulate appetite (hesitant to cycle TPN again at this time given continued low CBGs). TPN provides 1171 kcals and 60g of protein, meeting 78% of estimated kcal needs and 80% of estimated protein needs.   Labs reviewed: K: 3.3 (on IV supplementation), CBGS: 99-110.   Diet Order:  Diet full liquid Room service appropriate? Yes; Fluid consistency: Thin TPN ADULT (ION) TPN ADULT (ION)  EDUCATION NEEDS:   Not appropriate for education at this time  Skin:  Skin Assessment: Skin Integrity Issues: Skin Integrity Issues:: Other (Comment) Incisions: closed abdomen Other: chest tubes, MASD to buttocks  Last BM:  11/03/17 (100 ml output via rectal tube)  Height:   Ht Readings from Last 1 Encounters:  10/19/2017 5' 4.5" (1.638 m)    Weight:   Wt Readings from Last 1 Encounters:  10/13/2017 123 lb 0.3 oz (55.8 kg)    Ideal Body Weight:  55.68 kg  BMI:  Body mass index is 20.79 kg/m.  Estimated Nutritional Needs:   Kcal:  1500-1700  Protein:  75-90 grams  Fluid:  > 1.5 L    Adonys Wildes A. Jimmye Norman, RD, LDN, CDE Pager: [phone] After  hours Pager: [phone]

## 2017-11-03 NOTE — Progress Notes (Signed)
13 Days Post-Op   Subjective/Chief Complaint: " I feel a lot better this morning." Took some PO states there is no emesis   Objective: Vital signs in last 24 hours: Temp:  [98.1 F (36.7 C)-104.6 F (40.3 C)] 98.6 F (37 C) (11/28 0726) Pulse Rate:  [96-150] 96 (11/28 0726) Resp:  [26-56] 32 (11/28 0726) BP: (119-205)/(69-88) 123/70 (11/28 0726) SpO2:  [96 %-100 %] 97 % (11/28 0726) Last BM Date: 11/02/17  Intake/Output from previous day: 11/27 0701 - 11/28 0700 In: 2361.7 [I.V.:626.7; IV Piggyback:1730] Out: 1850 [Urine:1750; Stool:100] Intake/Output this shift: No intake/output data recorded.  General appearance: alert and cooperative GI: soft, non-tender; bowel sounds normal; no masses,  no organomegaly  Lab Results:  Recent Labs    11/03/17 0002 11/03/17 0500  WBC 29.3* 24.3*  HGB 9.5* 8.9*  HCT 29.4* 27.9*  PLT 345 295   BMET Recent Labs    11/03/17 0002 11/03/17 0500  NA 133* 137  K 4.4 3.3*  CL 109 113*  CO2 15* 18*  GLUCOSE 88 102*  BUN 17 19  CREATININE 0.84 0.83  CALCIUM 7.9* 8.0*   PT/INR No results for input(s): LABPROT, INR in the last 72 hours. ABG No results for input(s): PHART, HCO3 in the last 72 hours.  Invalid input(s): PCO2, PO2  Studies/Results: No results found.  Anti-infectives: Anti-infectives (From admission, onward)   Start     Dose/Rate Route Frequency Ordered Stop   11/02/17 1200  aztreonam (AZACTAM) injection 1 g  Status:  Discontinued     1 g Intravenous Every 8 hours 11/02/17 0923 11/02/17 0936   11/02/17 1200  aztreonam (AZACTAM) 1 g in dextrose 5 % 50 mL IVPB     1 g 100 mL/hr over 30 Minutes Intravenous Every 8 hours 11/02/17 0936     11/02/17 1000  metroNIDAZOLE (FLAGYL) IVPB 500 mg     500 mg 100 mL/hr over 60 Minutes Intravenous Every 8 hours 11/02/17 0923     10/31/17 1215  linezolid (ZYVOX) IVPB 600 mg     600 mg 300 mL/hr over 60 Minutes Intravenous Every 12 hours 10/31/17 1202     10/27/17 1400   meropenem (MERREM) 1 g in sodium chloride 0.9 % 100 mL IVPB  Status:  Discontinued     1 g 200 mL/hr over 30 Minutes Intravenous Every 8 hours 10/27/17 1245 11/02/17 0921   10/27/17 1100  levofloxacin (LEVAQUIN) IVPB 750 mg  Status:  Discontinued     750 mg 100 mL/hr over 90 Minutes Intravenous Every 24 hours 10/27/17 1041 10/27/17 1218   10/27/17 0400  piperacillin-tazobactam (ZOSYN) IVPB 3.375 g  Status:  Discontinued     3.375 g 12.5 mL/hr over 240 Minutes Intravenous Every 8 hours 10/26/17 2201 10/27/17 1222   10/22/17 0600  vancomycin (VANCOCIN) IVPB 1000 mg/200 mL premix  Status:  Discontinued     1,000 mg 200 mL/hr over 60 Minutes Intravenous To Short Stay 10/20/2017 1631 10/13/2017 1637   10/29/2017 2100  vancomycin (VANCOCIN) IVPB 1000 mg/200 mL premix     1,000 mg 200 mL/hr over 60 Minutes Intravenous Every 12 hours 10/28/2017 1631 11/01/2017 2246   10/24/2017 0900  vancomycin (VANCOCIN) IVPB 1000 mg/200 mL premix     1,000 mg 200 mL/hr over 60 Minutes Intravenous To Surgery 11/02/2017 0857 10/19/2017 0957   10/17/17 0800  vancomycin (VANCOCIN) IVPB 750 mg/150 ml premix  Status:  Discontinued     750 mg 150 mL/hr over 60 Minutes Intravenous  Every 24 hours 10/16/17 1220 10/17/17 1023   10/16/17 1800  anidulafungin (ERAXIS) 100 mg in sodium chloride 0.9 % 100 mL IVPB     100 mg 78 mL/hr over 100 Minutes Intravenous Every 24 hours 10/15/17 1657     10/16/17 0600  vancomycin (VANCOCIN) IVPB 1000 mg/200 mL premix  Status:  Discontinued     1,000 mg 200 mL/hr over 60 Minutes Intravenous Every 36 hours 10/20/2017 2308 10/16/17 1220   10/15/17 1800  anidulafungin (ERAXIS) 200 mg in sodium chloride 0.9 % 200 mL IVPB     200 mg 78 mL/hr over 200 Minutes Intravenous  Once 10/15/17 1657 10/15/17 2148   10/20/2017 2330  piperacillin-tazobactam (ZOSYN) IVPB 3.375 g  Status:  Discontinued     3.375 g 100 mL/hr over 30 Minutes Intravenous Every 8 hours 10/07/2017 2257 10/18/2017 2259   11/02/2017 2315   piperacillin-tazobactam (ZOSYN) IVPB 3.375 g  Status:  Discontinued     3.375 g 12.5 mL/hr over 240 Minutes Intravenous Every 8 hours 10/08/2017 2300 10/26/17 2201   10/13/2017 1930  vancomycin (VANCOCIN) IVPB 1000 mg/200 mL premix     1,000 mg 200 mL/hr over 60 Minutes Intravenous  Once 11/02/2017 1915 10/08/2017 2044   10/07/2017 1530  piperacillin-tazobactam (ZOSYN) IVPB 3.375 g     3.375 g 100 mL/hr over 30 Minutes Intravenous  Once 10/26/2017 1529 10/12/2017 1604      Assessment/Plan: s/p Procedure(s): LEFT VIDEO ASSISTED THORACOSCOPY (VATS) for MEDISTINAL DRAINAGE (Left) Con't encourage PO Mobilize   LOS: 20 days    [name]., Anne Hahn 11/03/2017

## 2017-11-03 NOTE — Progress Notes (Signed)
[facility] NOTE   Pharmacy Consult for TPN Indication: Poor PO intake / unable to place repeat G-tube   Patient Measurements: Height: 5' 4.5" (163.8 cm) Weight: 123 lb 0.3 oz (55.8 kg) IBW/kg (Calculated) : 55.85 TPN AdjBW (KG): 59 Body mass index is 20.79 kg/m.  Assessment:  15 yoF re-admit 11/8 with N/V, loose stools (baseline w/Crohn's). Was hospitalized for a month in October and developed fungemia and treated with Invanz and anidulafungin. Re-admitted a week later with intermittent fevers and difficultly swallowing. CT chest showed recurrent mediastinal fluid abscess.  PMH: Crohns, HH, hx SBO  Significant events:  11/15 Left VATs to drain mediastinum  11/23 IR guided perihepatic abscess drain placement   GI: Mediastinal abscess complex, advanced to full liquid diet again but N/V with almost anything PO - continuing nausea, poor appetite but taking in some PO this AM with no emesis. Patient refuses feeding tube. Phenergan low dose d/t Qtc TID and monitoring for each dose. Avoiding Zofran. Prn Compazine. Abd abscess drain output low. Adding megace/reglan to regimen 11/26. PPI IV. Albumin low at 1.5, Pre-albumin low at 8. Last BM 11/27 Endo: CBGs remain on lower end on continuous TPN, 88-110. Sliding scale insulin stopped on 11/21. D5 d/c'd 11/26 Insulin requirements past 24 hours: 0 units Lytes: Na up to 137, Cl up to 113, CO2 up to 18. K back down to 3.3 (S/p K runs x3 + increased K in TPN). Phos up to 3.9 (after phos increased in TPN), CoCa 10. Mag wnl Renal: SCr 0.84 stable, BUN 19. UOP 1.3 mL/kg/hr Neuro: Fent PCA, APAP and fent prn - pain score 0 Pulm: Bilateral chest tube output at 20 mL / 24 hours, chest tubes to remain in place for several days. R-effusion larger 11/23 - not significant fluid to remove Cards: BP soft-wnl, HR NSR 90-100s. Lopressor prn Anticoag: RUE DVT - holding heparin due to hematemesis Hepatobil: AST mildly  elevated, ALT and Tbili wnl. Alk phos up 149. Trig [id number] ID: ID changed to Aztreonam + Flagyl + Anidulafungin for mediastinal abscess due to concern for possible drug fever. 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 Rare budding yeast, GPC in pairs on Gstain of 11/23 abd abscess cx. Tmax/24h 104.6, WBC up to 24.3. Drain in place. Cdiff negative. 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 Repeat blood cultures sent 11/25  Best Practices: SCDs TPN Access: PICC 11/11>> TPN start date: 11/11>>  Nutritional Goals (per RD recommendation on 11/19): Kcal:  1500-1700 Protein:  75-90 grams  Fluid:  > 1.5 L  Current Nutrition:  Full liquid (appetite poor) Boost Breeze PO BID added 11/26 - provides 500 kcal + 18g protein total daily (all doses charted) Custom TPN   Plan:  Continue reduced TPN rate of 50 mL/hr over 24 hr to account for addition of Boost Breeze, attempt to stimulate appetite - hesitant to cycle TPN again at this time given continued low CBGs TPN provides 60 g of protein, 168 g of dextrose, and 36 g lipids providing 1171 total kcal, which meets ~100% of patient needs in combination with Boost Breeze supplements. Electrolytes in TPN: increase K, decrease Na slightly, continue max acetate AddMVI,trace elements in TPN Continue CBG checks for hypoglycemic events Monitor TPN labs, clinical progress, Qtc F/U ability to tolerate PO diet, GOC  Give K runs x 5   Elicia Lamp, PharmD, BCPS Clinical Pharmacist Rx Phone # for today: [phone] After 3:30PM, please call Main Rx: [phone]  11/03/2017 7:42 AM

## 2017-11-03 NOTE — Progress Notes (Signed)
PROGRESS NOTE    Danielle [name]  FHL:[id number] DOB: 09-Aug-1946 DOA: 10/31/2017 PCP: Iona Beard, MD    Brief Narrative: 71 y.o.F w/ a Hxof Crohn's disease, hiatal hernia,psoriasis, and SBO who underwent a type IV hiatal hernia repair in October 2018.Postoperatively the patient required endotracheal intubation, mechanical ventilation and the use of pressors. She was extubated on postop day #2. She improved clinically and was on parenteral nutrition, however she developed a pelvic fluid collection which required a drain. On 09/11/2017 she had blood cultures positive for Candida glabrata, and the pelvic abscess culture was positive for C. Albicans and C. Glabrata. She was given treatment withparenteral antifungals. She subsequently improved, was able to ambulate with physical therapy, weaned off of parenteral nutrition and was tolerating oral diet. ID recommended to continue Invanz andErasisfor 2 weeks. A PICC line was placed in her right arm and she was ultimately discharged home.  She returned on 10/01/2017 due to intermittent fevers. She was treated empirically and clinically improved to the point that she was able to be d/c home again on 11/1.  She returned to the ED 11/8 with complaints of persistent nausea associated with emesis. CT abdomen pelvis showed re accumulation of fluid collection in the lower mediastinum, bibasilar pleural effusion, another small complex collection along the posterior margin of the spleen. Patient underwent left video assisted thoracoscopy for mediastinal drainage of mediastinal abscess. ID has been helping with management of IV antibiotics.   Patient continue to spike fevers, persistent nausea and vomiting. CT abdomen pelvis; near complete resolution of mediastinal abscess. Stable small rim enhancing fluid collections along posterosuperior margin of the spleen and pelvic cul-de-sac, suspicious for abscesses. She underwent  CT guide placement of 10 Fr  drain in anterior abdominal abscess on 11-23. [name] Fluid growing yeast and Gram positive cocci in pairs. Started on Zyvox. WBC now started to decreased, still febrile.   Palliative care team met with patient and her family. Patients wants to continue with aggressive care.    Assessment & Plan:   Principal Problem:   Septic shock ([place]) Active Problems:   Leukocytosis   Perianal Crohn's disease, with fistula ([place])   Acute renal failure superimposed on stage 3 chronic kidney disease (HCC)   Hiatal hernia   Immunosuppressed status (HCC)   Chest tube in place   Hypotension   Vomiting in adult patient   S/P thoracentesis   Anasarca   DVT (deep venous thrombosis) (HCC)   Protein-calorie malnutrition, moderate (HCC)   Mediastinal abscess (HCC)   Mediastinal post-op seromas   Pleural effusion, left   Patient's noncompliance with intervention / medical advice    Dehydration   Acute embolism and thrombosis of left axillary vein (HCC)   Goals of care, counseling/discussion   Palliative care encounter   Fever   Intra-abdominal infection   Hemoptysis   Abscess   Intractable vomiting with nausea   SevereSepsis/Mediastinal abscess/intra-abdominal abscess; Patient underwent esophagogram showing 2.5 cm area of extrinsic compression upon esophagus, underwent also thoracentesis. Case was reviewed by IR and gastroenterologist, recommendation was for CVTS consult.  -S/P VATS drainage on 11/15. Patient continue to spike fever, leukocytosis. .  Repeated CT scan 11-19; Ill-defined multiloculated rim enhancing fluid collection and extensive edema in posterior mediastinum inferior to mediastinal drain and anterior to aorta, probably representing the residual of drained abscess. Discrete 14 mm abscess present in the right posterior mediastinum. Multiloculated rim enhancing fluid collection surrounding the spleen and extending into left pericolic gutter, likely abscess. Small bilateral  pleural effusions  with left pleural drain. Stable dependent lower lobe consolidations may represent atelectasis or pneumonia. -Per CVTS mediastinal drain in good position, still has phlegmon in the area.  -CT abdomen pelvis; near complete resolution of mediastinal abscess. Stable small rim enhancing fluid collections along posterosuperior margin of the spleen and pelvic cul-de-sac, suspicious for abscesses. -underwent CT guide placement of 10 Fr drain in anterior abdominal abscess. Fluid growing yeast and Gram positive cocci in pairs.  - ID following. Continue Anidulafungin and linezolid. Due to concern that her fevers are from beta lactams & Carbepenams, ID stopped meropenem and changed to aztreonam + metronidazole on 11/27. - She did spike fever of 104.6 last night but too soon to see effect of change of antimicrobials. Continue to monitor. - As per thoracic surgery, will likely need chest CT scan before mediastinal drain is removed. Minimal drainage from drain recorded.  Right side pleural effusion;  [place] perform not significant fluid to be removed.   Persistent fevers, leukocytosis;  Multifactorial, abdominal abscess, mediastinal abscess versus drug fever. Please see discussion above.  Type IV hiatal hernia repair in October 2018. Post operative pelvic abscess;  -CT chest 11-19. For Multiloculated rim enhancing fluid collection surrounding the spleen and extending into left pericolic gutter, likely abscess. -CT abdomen and pelvis;  near complete resolution of mediastinal abscess. Stable small rim enhancing fluid collections along posterosuperior margin of the spleen and pelvic cul-de-sac, suspicious for abscesses. -underwent CT guide placement of 10 Fr drain in anterior abdominal abscess. Fluid growing yeast and Gram positive cocci in pairs. Blood cultures 2 from 11/25 and abdominal abscess culture from 11/23: Negative to date. Started Zyvox. continue aztreonam + metronidazole.  Known candida galbatra  infection; culture 10-12, pelvis abscess; on Eraxis. ID following  Repeated blood culture 11-25 no growth to date  History of Crohns/ chronic diarrhea  C diff 11-10 was negative.  CT abdomen with Mild increase in wall thickening and adjacent inflammatory changes involving the distal ileum, with mild dilatation of several small bowel loops proximal to this site. This is consistent with Crohn's disease and stricture formation. -C diff negative.  -No candidate for treatment for Crohn's  per GI.  - Still has rectal tube with ongoing diarrhea.? Imodium trial.  Urinary Retention; continue with foley catheter; will need voiding trial when overall clinically improved..   Nutrition;  Nutrition started on TPN.  Patient with a recent fungemia with candida glabrata and albicans.  Per Dr Baxter Flattery ok to continue with TPN for now.  Per Dr Rosendo Gros; oral trial. He doubt G tube will be stable.  Follow Sx recommendation regarding Jejunostomy tube  Anemia; no further hematemesis.  Hb slowly trending down. Monitor daily. Stable.  Chronic diastolic CHF Compensated.   Recent Hematemesis/hemoptysis -stopped IV heparin  -lateral upper extremity DVT 09/2017.  -positive DVT in the left radial vein at this time -no recurrent hematemesis or hemoptysis. IV Protonix drip -CT scan of the chest shows evidence of esophageal wall thickening consistent with esophagitis -GI following.   RUE DVT; holding heparin due to hematemesis.  When hb stable plan to resume heparin if clear by GI. GI signed off on 11/25. We'll have to discuss with them regarding initiation of IV heparin.      DVT prophylaxis: SCD.  Code Status: full code.  Family Communication: None at bedside. Disposition Plan: Not medically stable for discharge.   Consultants:   General surgery  CVTS  GI   Procedures: events; Significant Events: 11/8 readmit 11/11  PICC placed 11/12 thoracentesis 11/5 to OR at [facility] for L VATS for  mediastinal drainage  Patient has left IJ, LUE decline, Foley catheter, rectal tube, left chest tube and percutaneous abdominal drain.     Antimicrobials; antifungal  Erasix 10-16-2017  Meropenem 10-27-2017-discontinued.  Aztreonam + metronidazole 11/27 >.   Subjective: [place] states that she feels better. Denies dyspnea or pain. Has multiple lines. Overnight events noted, high fever. Responded to ibuprofen.  Objective: Vitals:   11/03/17 1200 11/03/17 1300 11/03/17 1500 11/03/17 1529  BP:      Pulse:      Resp: (!) 26     Temp:  100.2 F (37.9 C)  (!) 100.7 F (38.2 C)  TempSrc:  Rectal Rectal Rectal  SpO2: 96%     Weight:      Height:        Intake/Output Summary (Last 24 hours) at 11/03/2017 1548 Last data filed at 11/03/2017 1300 Gross per 24 hour  Intake 3530.01 ml  Output 1850 ml  Net 1680.01 ml   Filed Weights   10/15/17 1449 10/20/17 1600 10/22/2017 0500  Weight: 55.9 kg (123 lb 3.8 oz) 55.3 kg (121 lb 14.6 oz) 55.8 kg (123 lb 0.3 oz)    Examination:  General exam: Chronic ill appearing. Middle-aged female small built, frail and cachectic. Respiratory system; reduced breath sounds in the bases with few basal crackles. Rest of lung fields clear to auscultation. No increased work of breathing. Cardiovascular system: S1 and S2 heard, regular tachycardic. No JVD, murmurs or pedal edema. Telemetry: Sinus rhythm. Was in sinus tachycardia overnight in the 140s. Gastrointestinal system: BS present, soft. midline abdominal wound healing. Drain mid  quadrant.  Central nervous system: Alert and oriented 3. No focal neurological deficits. Extremities: no edema.    Data Reviewed: I have personally reviewed following labs and imaging studies  CBC: Recent Labs  Lab 10/31/17 0429 11/01/17 0230 11/02/17 0417 11/03/17 0002 11/03/17 0500  WBC 34.8* 20.7* 19.5* 29.3* 24.3*  NEUTROABS  --  14.5*  --   --  21.4*  HGB 9.8* 9.1* 8.7* 9.5* 8.9*  HCT 30.7* 27.5*  27.4* 29.4* 27.9*  MCV 84.1 84.6 84.6 84.5 83.5  PLT 364 331 329 345 115   Basic Metabolic Panel: Recent Labs  Lab 10/28/17 0500  10/30/17 2156 10/31/17 0429 11/01/17 0230 11/02/17 0417 11/03/17 0002 11/03/17 0500  NA 135   < > 131*  --  133* 133* 133* 137  K 4.5   < > 5.1  --  3.7 3.2* 4.4 3.3*  CL 107   < > 107  --  108 107 109 113*  CO2 22   < > 17*  --  19* 20* 15* 18*  GLUCOSE 110*   < > 129*  --  109* 101* 88 102*  BUN 18   < > 18  --  22* _0 CREATININE 0.87   < > 0.95  --  0.87 0.77 0.84 0.83  CALCIUM 7.8*   < > 8.5*  --  8.2* 8.2* 7.9* 8.0*  MG 1.9  --  1.9  --  2.0 2.0  --  1.7  PHOS 4.2  --   --  3.9 4.6 3.6  --  3.9   < > = values in this interval not displayed.   GFR: Estimated Creatinine Clearance: 56.4 mL/min (by C-G formula based on SCr of 0.83 mg/dL). Liver Function Tests: Recent Labs  Lab 10/28/17 0500 11/01/17 0230  AST  21 52*  ALT 16 37  ALKPHOS 120 149*  BILITOT 0.3 0.5  PROT 6.0* 7.2  ALBUMIN 1.3* 1.5*   No results for input(s): LIPASE, AMYLASE in the last 168 hours. No results for input(s): AMMONIA in the last 168 hours. Coagulation Profile: Recent Labs  Lab 10/29/17 1115  INR 1.24   Cardiac Enzymes: No results for input(s): CKTOTAL, CKMB, CKMBINDEX, TROPONINI in the last 168 hours. BNP (last 3 results) No results for input(s): PROBNP in the last 8760 hours. HbA1C: No results for input(s): HGBA1C in the last 72 hours. CBG: Recent Labs  Lab 11/01/17 1615 11/01/17 2000 11/02/17 0150 11/02/17 0743 11/02/17 1208  GLUCAP 94 101* 110* 99 110*   Lipid Profile: Recent Labs    11/01/17 0230  TRIG 117   Thyroid Function Tests: No results for input(s): TSH, T4TOTAL, FREET4, T3FREE, THYROIDAB in the last 72 hours. Anemia Panel: No results for input(s): VITAMINB12, FOLATE, FERRITIN, TIBC, IRON, RETICCTPCT in the last 72 hours. Sepsis Labs: Recent Labs  Lab 10/30/17 2320 10/31/17 0145 11/03/17 0002 11/03/17 0500    LATICACIDVEN 1.0 1.1 2.2* 1.7    Recent Results (from the past 240 hour(s))  Culture, blood (routine x 2)     Status: None   Collection Time: 10/24/17  3:50 PM  Result Value Ref Range Status   Specimen Description BLOOD RIGHT FOOT  Final   Special Requests IN PEDIATRIC BOTTLE Blood Culture adequate volume  Final   Culture NO GROWTH 5 DAYS  Final   Report Status 10/29/2017 FINAL  Final  Culture, blood (routine x 2)     Status: None   Collection Time: 10/27/17  1:19 PM  Result Value Ref Range Status   Specimen Description BLOOD RIGHT HAND  Final   Special Requests IN PEDIATRIC BOTTLE Blood Culture adequate volume  Final   Culture NO GROWTH 5 DAYS  Final   Report Status 11/01/2017 FINAL  Final  Culture, blood (routine x 2)     Status: None   Collection Time: 10/27/17  1:24 PM  Result Value Ref Range Status   Specimen Description BLOOD RIGHT HAND  Final   Special Requests IN PEDIATRIC BOTTLE Blood Culture adequate volume  Final   Culture NO GROWTH 5 DAYS  Final   Report Status 11/01/2017 FINAL  Final  Culture, Urine     Status: None   Collection Time: 10/27/17  4:44 PM  Result Value Ref Range Status   Specimen Description URINE, RANDOM  Final   Special Requests NONE  Final   Culture NO GROWTH  Final   Report Status 10/28/2017 FINAL  Final  Aerobic/Anaerobic Culture (surgical/deep wound)     Status: None (Preliminary result)   Collection Time: 10/29/17  4:17 PM  Result Value Ref Range Status   Specimen Description ABSCESS  Final   Special Requests ABDOMEN  Final   Gram Stain   Final    ABUNDANT WBC PRESENT, PREDOMINANTLY PMN RARE GRAM POSITIVE COCCI IN PAIRS RARE BUDDING YEAST SEEN    Culture   Final    NO GROWTH 4 DAYS NO ANAEROBES ISOLATED; CULTURE IN PROGRESS FOR 5 DAYS   Report Status PENDING  Incomplete  C difficile quick scan w PCR reflex     Status: None   Collection Time: 10/29/17  4:25 PM  Result Value Ref Range Status   C Diff antigen NEGATIVE NEGATIVE Final    C Diff toxin NEGATIVE NEGATIVE Final   C Diff interpretation No C. difficile detected.  Final  Culture, blood (x 2)     Status: None (Preliminary result)   Collection Time: 10/31/17  2:06 AM  Result Value Ref Range Status   Specimen Description BLOOD LEFT FOOT  Final   Special Requests IN PEDIATRIC BOTTLE Blood Culture adequate volume  Final   Culture NO GROWTH 3 DAYS  Final   Report Status PENDING  Incomplete  Culture, blood (x 2)     Status: None (Preliminary result)   Collection Time: 10/31/17  2:12 AM  Result Value Ref Range Status   Specimen Description BLOOD RIGHT FOOT  Final   Special Requests IN PEDIATRIC BOTTLE Blood Culture adequate volume  Final   Culture NO GROWTH 3 DAYS  Final   Report Status PENDING  Incomplete         Radiology Studies: No results found.      Scheduled Meds: . chlorhexidine  15 mL Mouth Rinse BID  . feeding supplement  1 Container Oral BID BM  . fentaNYL   Intravenous Q4H  . mouth rinse  15 mL Mouth Rinse q12n4p  . megestrol  400 mg Oral TID  . metoCLOPramide (REGLAN) injection  5 mg Intravenous Q6H  . pantoprazole  40 mg Intravenous Q12H  . promethazine  6.25 mg Intravenous TID AC  . sodium chloride flush  5 mL Intravenous Q8H   Continuous Infusions: . acetaminophen 1,000 mg (11/03/17 1300)  . anidulafungin Stopped (11/02/17 2158)  . aztreonam 1 g (11/03/17 1158)  . linezolid (ZYVOX) IV Stopped (11/03/17 1200)  . metronidazole Stopped (11/03/17 1200)  . potassium chloride Stopped (11/02/17 1019)  . TPN ADULT (ION) 50 mL/hr at 11/03/17 1200  . TPN ADULT (ION)       LOS: 20 days    Vernell Leep, MD, FACP, [facility]. [facility] Pager [phone]  If 7PM-7AM, please contact night-coverage [email or web address] Password [email or web address] 11/03/2017, 4:02 PM

## 2017-11-03 NOTE — Progress Notes (Signed)
Pt anxious, calling out frequently, wants staff to stay in room.  Requested sitter to sit with pt.

## 2017-11-03 NOTE — Progress Notes (Signed)
Pt with persistent fever despite IV tylenol and cooling blanket. Pt tachycardic into the 140's and tachypneic into the 40's as well but O2 sats 96% on RA. Bedside RN concerned regarding pts persistent temp which was elevated at 105.4  , tachycardia, and tachypnea. Went to the bedside to check on  pt. Pt in NAD on RA. A 1L NS bolus and IV Ibuprofen was ordered, a CBC, BMET, and Lactate were also drawn. Spoke with Dr. Jimmy Footman of [facility] who was updated on the pts status and recommended that an ABG be ordered. Will continue to monitor closely on stepdown.  Arby Barrette NP-C [facility] Pager [phone]

## 2017-11-03 NOTE — Progress Notes (Addendum)
      [address]       [place] [address]             [phone]      13 Days Post-Op Procedure(s) (LRB): LEFT VIDEO ASSISTED THORACOSCOPY (VATS) for MEDISTINAL DRAINAGE (Left) Subjective: Feeling okay at the moment. Does have some pain around the chest tube site. Still using the PCA.  Objective: Vital signs in last 24 hours: Temp:  [98.1 F (36.7 C)-104.6 F (40.3 C)] 98.6 F (37 C) (11/28 0726) Pulse Rate:  [96-150] 96 (11/28 0726) Cardiac Rhythm: Normal sinus rhythm (11/28 0700) Resp:  [32-56] 32 (11/28 0726) BP: (119-205)/(69-88) 123/70 (11/28 0726) SpO2:  [96 %-100 %] 97 % (11/28 0726)     Intake/Output from previous day: 11/27 0701 - 11/28 0700 In: 2361.7 [I.V.:626.7; IV Piggyback:1730] Out: 1850 [Urine:1750; Stool:100] Intake/Output this shift: No intake/output data recorded.  General appearance: alert, cooperative and no distress Heart: regular rate and rhythm, S1, S2 normal, no murmur, click, rub or gallop Lungs: clear to auscultation bilaterally Wound: c/d/i no drainage staples in place. Tube in good position and secure.   Lab Results: Recent Labs    11/03/17 0002 11/03/17 0500  WBC 29.3* 24.3*  HGB 9.5* 8.9*  HCT 29.4* 27.9*  PLT 345 295   BMET:  Recent Labs    11/03/17 0002 11/03/17 0500  NA 133* 137  K 4.4 3.3*  CL 109 113*  CO2 15* 18*  GLUCOSE 88 102*  BUN 17 19  CREATININE 0.84 0.83  CALCIUM 7.9* 8.0*    PT/INR: No results for input(s): LABPROT, INR in the last 72 hours. ABG    Component Value Date/Time   PHART 7.317 (L) 10/22/2017 0420   HCO3 17.9 (L) 10/22/2017 0420   TCO2 15 (L) 09/10/2017 1214   ACIDBASEDEF 7.1 (H) 10/22/2017 0420   O2SAT 96.1 10/22/2017 0420   CBG (last 3)  Recent Labs    11/02/17 0150 11/02/17 0743 11/02/17 1208  GLUCAP 110* 99 110*    Assessment/Plan: S/P Procedure(s) (LRB): LEFT VIDEO ASSISTED THORACOSCOPY (VATS) for MEDISTINAL DRAINAGE (Left)  1. S/p VATs for mediastinal  drainage with tube in place. No new CXR or CT scans to review. Will likely need chest CT scan before mediastinal drain is removed. Minimum drainage from the drain recorded.  2. Continues to have persistent fevers. tmax 104.6. Leukocytosis WBC 24.3k. Continue ABX per primary. Currently afebrile on IV tylenol.  3. N/V seems to be controlled for the moment but usually worse at night per the patient.   Plan: plan of care per primary/surgery. Continue mediastinal tube.     LOS: 20 days    Elgie Collard 11/03/2017 Patient seen and examined, agree with above She has a low grade temp this PM. Spiked to 104 last night. It has been about a week since her last CT. If fevers persist would repeat CT with PO and IV contrast

## 2017-11-03 NOTE — Progress Notes (Signed)
Daily Progress Note   Patient Name: Danielle [name]       Date: 11/03/2017 DOB: May 30, 1946  Age: 71 y.o. MRN#: [id number] Attending Physician: Modena Jansky, MD Primary Care Physician: Iona Beard, MD Admit Date: 10/13/2017  Reason for Consultation/Follow-up: Establishing goals of care  Subjective: Ms. Shaffer seems in good spirits today although seems a little anxious.    Length of Stay: 20  Current Medications: Scheduled Meds:  . chlorhexidine  15 mL Mouth Rinse BID  . feeding supplement  1 Container Oral BID BM  . fentaNYL   Intravenous Q4H  . mouth rinse  15 mL Mouth Rinse q12n4p  . megestrol  400 mg Oral TID  . metoCLOPramide (REGLAN) injection  5 mg Intravenous Q6H  . pantoprazole  40 mg Intravenous Q12H  . promethazine  6.25 mg Intravenous TID AC  . sodium chloride flush  5 mL Intravenous Q8H    Continuous Infusions: . anidulafungin Stopped (11/02/17 2158)  . aztreonam Stopped (11/03/17 0523)  . linezolid (ZYVOX) IV 600 mg (11/03/17 0831)  . metronidazole 500 mg (11/03/17 0831)  . potassium chloride Stopped (11/02/17 1019)  . potassium chloride 10 mEq (11/03/17 0833)  . TPN ADULT (ION) 50 mL/hr at 11/02/17 1753  . TPN ADULT (ION)      PRN Meds: diphenhydrAMINE **OR** diphenhydrAMINE, fentaNYL (SUBLIMAZE) injection, hydrocortisone, lip balm, liver oil-zinc oxide, metoprolol tartrate, naloxone **AND** sodium chloride flush, naphazoline-glycerin, nystatin, oxyCODONE, phenol, potassium chloride, prochlorperazine, promethazine, sodium chloride flush, traMADol  Physical Exam     Constitutional: She is oriented to person, place, and time. She has a sickly appearance.  Frail, malnourished  HENT:  Head: Normocephalic and atraumatic.  Cardiovascular: Regular rhythm.  Tachycardia present.  Pulmonary/Chest: Effort normal. No accessory muscle usage. Tachypnea. No respiratory distress. She has decreased breath sounds in the left lower field.  Lt CT in place  Abdominal: Soft. Normal appearance.  Neurological: She is alert and oriented to person, place, and time.  Psychiatric:  Tired, frustrated  Nursing note and vitals reviewed.    Vital Signs: BP 123/70 (BP Location: Right Leg)   Pulse 96   Temp 98.6 F (37 C) (Rectal)   Resp (!) 32   Ht 5' 4.5" (1.638 m)   Wt 55.8 kg (123 lb 0.3 oz)   SpO2 97%  BMI 20.79 kg/m  SpO2: SpO2: 97 % O2 Device: O2 Device: Not Delivered O2 Flow Rate: O2 Flow Rate (L/min): 2 L/min  Intake/output summary:   Intake/Output Summary (Last 24 hours) at 11/03/2017 1141 Last data filed at 11/03/2017 1610 Gross per 24 hour  Intake 2361.67 ml  Output 1850 ml  Net 511.67 ml   LBM: Last BM Date: 11/02/17 Baseline Weight: Weight: 59 kg (130 lb) Most recent weight: Weight: 55.8 kg (123 lb 0.3 oz)       Palliative Assessment/Data: 20%    Flowsheet Rows     Most Recent Value  Intake Tab  Referral Department  Hospitalist  Unit at Time of Referral  Med/Surg Unit  Palliative Care Primary Diagnosis  Nephrology  Date Notified  10/26/17  Palliative Care Type  New Palliative care  Reason for referral  Clarify Goals of Care  Date of Admission  10/15/2017  Date first seen by Palliative Care  10/27/17  # of days Palliative referral response time  1 Day(s)  # of days IP prior to Palliative referral  12  Clinical Assessment  Psychosocial & Spiritual Assessment  Palliative Care Outcomes      Patient Active Problem List   Diagnosis Date Noted  . Abscess   . Intractable vomiting with nausea   . Goals of care, counseling/discussion   . Palliative care encounter   . Fever   . Intra-abdominal infection   . Hemoptysis   . Dehydration   . Acute embolism and thrombosis of left axillary vein ([place])   . Septic shock ([place])  10/17/2017  . Patient's noncompliance with intervention / medical advice  10/17/2017  . 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 Pleural effusion, left 10/16/2017  . 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 DVT (deep venous thrombosis) ([place])   . Metabolic acidosis   . Thrombocytosis ([place])   . Protein-calorie malnutrition, moderate ([place])   . Normochromic normocytic anemia   . SIRS (systemic inflammatory response syndrome) ([place]) 10/01/2017  . Pressure injury of skin 09/25/2017  . Hypoglycemia 09/25/2017  . Electrolyte imbalance 09/15/2017  . Anasarca 09/13/2017  . S/P thoracentesis 09/11/2017  . Peritonitis ([place]) 09/11/2017  . Fungemia 09/11/2017  . Gastrostomy tube in place ([place]) 09/08/2017  . Protein-calorie malnutrition, severe 06/10/2017  . Vomiting in adult patient 06/08/2017  . 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 Hypotension 05/07/2017  . UTI (urinary tract infection) 05/07/2017  . Chest tube in place   . Crohn disease ([place]) 02/19/2017  . Immunosuppressed status ([place]) 12/09/2015  . Hiatal hernia 12/07/2015  . Acute renal failure superimposed on stage 3 chronic kidney disease ([place]) 12/06/2015  . Perianal Crohn's disease, with fistula ([place])   . Hypomagnesemia   . Leukocytosis 11/06/2011    Palliative Care Assessment & Plan   HPI: 71 y.o. female  with past medical history of Crohn's disease, incarcerated hiatal hernia, CKD stage 3, diastolic heart failure, SBO, left buttock abscess, psoriasis, colostomy with take down ~1 month 2009. Admitted on 10/20/2017 with nausea and vomiting. Has had complicated history with hernia operation 96/0 and stay complicated by abd pain and G tube leaking requiring reoperation, purulent peritonitis, requiring intubation and vasopressors, ileus, fungemia, pelvic fluid collection drained 10/3-10/21 but discharged to home being able to walk and tolerated diet. Readmitted 10/26-11/1 with fever felt to be related to dehydration and post infection process  followed by ID and with RUE DVT. Currently ID following for treatment of infection and monitoring blood cultures and fluid collections. Had thoracentesis 11/12  and VATs for mediastinum abscess 11/15. She is severely malnourished (on TPN) and deconditioned with poorly controlled infection with continued high fevers and nausea. Recommendation to stop TPN to better control infection but intake is very poor. IR consulted and request CT abd to assess for feeding tube placement. Anterior abd abscess drain placed 10/29/17. Palliative requested to assist with GOC.    Assessment: Ms. Conry tells me that she still has nausea although appears to be a little improved. She does ask me for Breeze supplement to drink (would not previously entertain idea of intake). Still struggles with fevers especially at night time. She does say she feels a little better. Talks about having chest tube removed today. She continues to hope for improvement and desire aggressive care. She is tachypneic today - discussed taking slow, deep breathes. Educated on use of IS at bedside - encouraged her to use while awake during commercial breaks on TV. Emotional support provided.   Discussed with RN regarding fever increasing (ordered IV Tylenol) and RN has noted that Ms. Nasca is requesting pain medication via PCA twice what is being delivered. Recommendations below.   Recommendations/Plan:  Nausea/vomiting: Zofran gives her headaches -try and avoid, I d/c'd as she says this does not help at all anyways. Phenergan scheduled TID with meals 6.25 mg IV. Phenergan 6.25 mg IV every 6 hours prn. Will also add prn Compazine which may have less effect on QTc but also monitor closely. Monitor QTc and call/hold antiemetics for QTc >0.42. Could consider low dose Ativan for nausea if QTc restricting other antiemetics BUT I worry about confusion/delirium and sedation with this. Megace and Reglan added by surgery today.   Could consider IV Zyprexa 5 mg  qhs and d/c Megace (could help sleep, anxiety, and nausea)  Malnutrition: Would benefit from infectious standpoint to d/c TPN. Feeding tube a poor option and she also refuses anyway (even temporary Cortrak/NGT) - discussed again today. Continuing TPN for now.   Sacrum/buttocks breakdown: Desitin ordered as family says this worked best in the past. [place] consulted.    Goals of Care and Additional Recommendations:  Limitations on Scope of Treatment: Full Scope Treatment, refuses feeding tube  Code Status:  Full code  Prognosis:   Very poor with severe deconditioning and extensive infection burden. Unfortunately not really making any progress.   Discharge Planning:  To Be Determined   Thank you for allowing the Palliative Medicine Team to assist in the care of this patient.   Total Time 25 min Prolonged Time Billed  no       Greater than 50%  of this time was spent counseling and coordinating care related to the above assessment and plan.  Vinie Sill, NP Palliative Medicine Team Pager # [phone] (M-F 8a-5p) Team Phone # [phone] (Nights/Weekends)

## 2017-11-04 ENCOUNTER — Inpatient Hospital Stay (HOSPITAL_COMMUNITY): Payer: Medicare HMO

## 2017-11-04 ENCOUNTER — Encounter (HOSPITAL_COMMUNITY): Payer: Self-pay | Admitting: Radiology

## 2017-11-04 DIAGNOSIS — J80 Acute respiratory distress syndrome: Secondary | ICD-10-CM

## 2017-11-04 DIAGNOSIS — J9601 Acute respiratory failure with hypoxia: Secondary | ICD-10-CM

## 2017-11-04 DIAGNOSIS — K651 Peritoneal abscess: Secondary | ICD-10-CM

## 2017-11-04 DIAGNOSIS — R502 Drug induced fever: Secondary | ICD-10-CM

## 2017-11-04 DIAGNOSIS — J690 Pneumonitis due to inhalation of food and vomit: Secondary | ICD-10-CM

## 2017-11-04 LAB — POCT I-STAT 3, ART BLOOD GAS (G3+)
ACID-BASE DEFICIT: 17 mmol/L — AB (ref 0.0–2.0)
Acid-base deficit: 13 mmol/L — ABNORMAL HIGH (ref 0.0–2.0)
Acid-base deficit: 23 mmol/L — ABNORMAL HIGH (ref 0.0–2.0)
BICARBONATE: 7.7 mmol/L — AB (ref 20.0–28.0)
Bicarbonate: 15.5 mmol/L — ABNORMAL LOW (ref 20.0–28.0)
Bicarbonate: 10.7 mmol/L — ABNORMAL LOW (ref 20.0–28.0)
O2 SAT: 99 %
O2 SAT: 88 %
O2 Saturation: 81 %
PCO2 ART: 45.5 mmHg (ref 32.0–48.0)
PCO2 ART: 31.3 mmHg — AB (ref 32.0–48.0)
PH ART: 7.143 — AB (ref 7.350–7.450)
PO2 ART: 208 mmHg — AB (ref 83.0–108.0)
Patient temperature: 99
TCO2: 17 mmol/L — ABNORMAL LOW (ref 22–32)
TCO2: 12 mmol/L — ABNORMAL LOW (ref 22–32)
TCO2: 9 mmol/L — AB (ref 22–32)
pCO2 arterial: 33.9 mmHg (ref 32.0–48.0)
pH, Arterial: 7.142 — CL (ref 7.350–7.450)
pH, Arterial: 6.965 — CL (ref 7.350–7.450)
pO2, Arterial: 71 mmHg — ABNORMAL LOW (ref 83.0–108.0)
pO2, Arterial: 70 mmHg — ABNORMAL LOW (ref 83.0–108.0)

## 2017-11-04 LAB — COMPREHENSIVE METABOLIC PANEL
ALK PHOS: 135 U/L — AB (ref 38–126)
ALT: 30 U/L (ref 14–54)
ALT: 22 U/L (ref 14–54)
ANION GAP: 13 (ref 5–15)
AST: 45 U/L — AB (ref 15–41)
AST: 43 U/L — ABNORMAL HIGH (ref 15–41)
Albumin: 1.4 g/dL — ABNORMAL LOW (ref 3.5–5.0)
Albumin: 1.1 g/dL — ABNORMAL LOW (ref 3.5–5.0)
Alkaline Phosphatase: 146 U/L — ABNORMAL HIGH (ref 38–126)
Anion gap: 9 (ref 5–15)
BILIRUBIN TOTAL: 0.5 mg/dL (ref 0.3–1.2)
BUN: 16 mg/dL (ref 6–20)
BUN: 16 mg/dL (ref 6–20)
CALCIUM: 7.5 mg/dL — AB (ref 8.9–10.3)
CHLORIDE: 114 mmol/L — AB (ref 101–111)
CO2: 14 mmol/L — ABNORMAL LOW (ref 22–32)
CO2: 10 mmol/L — ABNORMAL LOW (ref 22–32)
CREATININE: 0.94 mg/dL (ref 0.44–1.00)
Calcium: 8.4 mg/dL — ABNORMAL LOW (ref 8.9–10.3)
Chloride: 110 mmol/L (ref 101–111)
Creatinine, Ser: 1.29 mg/dL — ABNORMAL HIGH (ref 0.44–1.00)
GFR calc Af Amer: >60 mL/min (ref >60)
GFR calc non Af Amer: 41 mL/min — ABNORMAL LOW (ref >60)
GFR, EST AFRICAN AMERICAN: 48 mL/min — AB (ref >60)
Glucose, Bld: 98 mg/dL (ref 65–99)
Glucose, Bld: 151 mg/dL — ABNORMAL HIGH (ref 65–99)
POTASSIUM: 3.5 mmol/L (ref 3.5–5.1)
Potassium: 4.1 mmol/L (ref 3.5–5.1)
Sodium: 137 mmol/L (ref 135–145)
Sodium: 133 mmol/L — ABNORMAL LOW (ref 135–145)
TOTAL PROTEIN: 5.3 g/dL — AB (ref 6.5–8.1)
Total Protein: 6.6 g/dL (ref 6.5–8.1)

## 2017-11-04 LAB — BLOOD GAS, ARTERIAL
ACID-BASE DEFICIT: 20.3 mmol/L — AB (ref 0.0–2.0)
Acid-base deficit: 12.2 mmol/L — ABNORMAL HIGH (ref 0.0–2.0)
Bicarbonate: 12.7 mmol/L — ABNORMAL LOW (ref 20.0–28.0)
Bicarbonate: 8.7 mmol/L — ABNORMAL LOW (ref 20.0–28.0)
DRAWN BY: 227661
DRAWN BY: 51147
Delivery systems: POSITIVE
Expiratory PAP: 6
FIO2: 60
FIO2: 100
INSPIRATORY PAP: 12
MECHVT: 480 mL
O2 SAT: 96.4 %
O2 Saturation: 83.5 %
PEEP/CPAP: 8 cmH2O
PH ART: 7.003 — AB (ref 7.350–7.450)
PO2 ART: 62.2 mmHg — AB (ref 83.0–108.0)
Patient temperature: 101.1
Patient temperature: 98
RATE: 26 resp/min
pCO2 arterial: 27 mmHg — ABNORMAL LOW (ref 32.0–48.0)
pCO2 arterial: 36.4 mmHg (ref 32.0–48.0)
pH, Arterial: 7.302 — ABNORMAL LOW (ref 7.350–7.450)
pO2, Arterial: 105 mmHg (ref 83.0–108.0)

## 2017-11-04 LAB — AEROBIC/ANAEROBIC CULTURE (SURGICAL/DEEP WOUND): CULTURE: NO GROWTH

## 2017-11-04 LAB — CBC WITH DIFFERENTIAL/PLATELET
BASOS PCT: 0 %
Basophils Absolute: 0 10*3/uL (ref 0.0–0.1)
EOS ABS: 0 10*3/uL (ref 0.0–0.7)
EOS PCT: 0 %
HCT: 27.1 % — ABNORMAL LOW (ref 36.0–46.0)
HEMOGLOBIN: 8.8 g/dL — AB (ref 12.0–15.0)
LYMPHS PCT: 6 %
Lymphs Abs: 2.9 10*3/uL (ref 0.7–4.0)
MCH: 27.3 pg (ref 26.0–34.0)
MCHC: 32.5 g/dL (ref 30.0–36.0)
MCV: 84.2 fL (ref 78.0–100.0)
Monocytes Absolute: 1.4 10*3/uL — ABNORMAL HIGH (ref 0.1–1.0)
Monocytes Relative: 3 %
NEUTROS ABS: 43.2 10*3/uL — AB (ref 1.7–7.7)
Neutrophils Relative %: 91 %
Platelets: 309 10*3/uL (ref 150–400)
RBC: 3.22 MIL/uL — ABNORMAL LOW (ref 3.87–5.11)
RDW: 18.8 % — ABNORMAL HIGH (ref 11.5–15.5)
WBC: 47.5 10*3/uL — ABNORMAL HIGH (ref 4.0–10.5)

## 2017-11-04 LAB — GLUCOSE, CAPILLARY
GLUCOSE-CAPILLARY: 87 mg/dL (ref 65–99)
GLUCOSE-CAPILLARY: 79 mg/dL (ref 65–99)
Glucose-Capillary: 153 mg/dL — ABNORMAL HIGH (ref 65–99)
Glucose-Capillary: 74 mg/dL (ref 65–99)

## 2017-11-04 LAB — CBC
HCT: 28.3 % — ABNORMAL LOW (ref 36.0–46.0)
HEMOGLOBIN: 8.6 g/dL — AB (ref 12.0–15.0)
MCH: 27 pg (ref 26.0–34.0)
MCHC: 30.4 g/dL (ref 30.0–36.0)
MCV: 89 fL (ref 78.0–100.0)
Platelets: 254 10*3/uL (ref 150–400)
RBC: 3.18 MIL/uL — AB (ref 3.87–5.11)
RDW: 19.9 % — ABNORMAL HIGH (ref 11.5–15.5)
WBC: 55.1 10*3/uL — AB (ref 4.0–10.5)

## 2017-11-04 LAB — C DIFFICILE QUICK SCREEN W PCR REFLEX
C DIFFICLE (CDIFF) ANTIGEN: NEGATIVE
C Diff interpretation: NOT DETECTED
C Diff toxin: NEGATIVE

## 2017-11-04 LAB — AEROBIC/ANAEROBIC CULTURE W GRAM STAIN (SURGICAL/DEEP WOUND)

## 2017-11-04 LAB — MAGNESIUM
MAGNESIUM: 1.7 mg/dL (ref 1.7–2.4)
MAGNESIUM: 1.5 mg/dL — AB (ref 1.7–2.4)

## 2017-11-04 LAB — PROCALCITONIN: Procalcitonin: 6.72 ng/mL

## 2017-11-04 LAB — PHOSPHORUS: Phosphorus: 4.3 mg/dL (ref 2.5–4.6)

## 2017-11-04 LAB — LACTIC ACID, PLASMA: LACTIC ACID, VENOUS: 9.2 mmol/L — AB (ref 0.5–1.9)

## 2017-11-04 MED ORDER — SODIUM CHLORIDE 0.9 % IV SOLN
1.2500 ng/kg/min | INTRAVENOUS | Status: DC
  Administered 2017-11-04: 5 ng/kg/min via INTRAVENOUS
  Filled 2017-11-04: qty 1

## 2017-11-04 MED ORDER — ORAL CARE MOUTH RINSE
15.0000 mL | Freq: Four times a day (QID) | OROMUCOSAL | Status: DC
  Administered 2017-11-05 (×2): 15 mL via OROMUCOSAL

## 2017-11-04 MED ORDER — MIDAZOLAM HCL 2 MG/2ML IJ SOLN
INTRAMUSCULAR | Status: AC
  Administered 2017-11-04: 2 mg via INTRAVENOUS
  Filled 2017-11-04: qty 2

## 2017-11-04 MED ORDER — INSULIN ASPART 100 UNIT/ML ~~LOC~~ SOLN
2.0000 [IU] | SUBCUTANEOUS | Status: DC
  Administered 2017-11-05: 4 [IU] via SUBCUTANEOUS

## 2017-11-04 MED ORDER — ROCURONIUM BROMIDE 50 MG/5ML IV SOLN
50.0000 mg | Freq: Once | INTRAVENOUS | Status: AC
  Administered 2017-11-04: 50 mg via INTRAVENOUS
  Filled 2017-11-04: qty 5

## 2017-11-04 MED ORDER — IOPAMIDOL (ISOVUE-300) INJECTION 61%
100.0000 mL | Freq: Once | INTRAVENOUS | Status: AC | PRN
  Administered 2017-11-04: 100 mL via INTRAVENOUS

## 2017-11-04 MED ORDER — VANCOMYCIN HCL 500 MG IV SOLR
500.0000 mg | Freq: Two times a day (BID) | INTRAVENOUS | Status: DC
  Administered 2017-11-04 – 2017-11-05 (×2): 500 mg via INTRAVENOUS
  Filled 2017-11-04 (×3): qty 500

## 2017-11-04 MED ORDER — SODIUM CHLORIDE 0.9 % IV SOLN
3.0000 ug/kg/min | INTRAVENOUS | Status: DC
  Administered 2017-11-04: 3 ug/kg/min via INTRAVENOUS
  Filled 2017-11-04: qty 20

## 2017-11-04 MED ORDER — MIDAZOLAM HCL 2 MG/2ML IJ SOLN
2.0000 mg | Freq: Once | INTRAMUSCULAR | Status: AC
  Administered 2017-11-04: 2 mg via INTRAVENOUS

## 2017-11-04 MED ORDER — SODIUM CHLORIDE 0.9 % IV SOLN
INTRAVENOUS | Status: DC
  Administered 2017-11-04: 14:00:00 via INTRAVENOUS

## 2017-11-04 MED ORDER — IOPAMIDOL (ISOVUE-300) INJECTION 61%
INTRAVENOUS | Status: AC
  Administered 2017-11-04: 30 mL
  Filled 2017-11-04: qty 30

## 2017-11-04 MED ORDER — MIDAZOLAM HCL 2 MG/2ML IJ SOLN: 1.0000 mg | INTRAMUSCULAR | Status: DC | PRN

## 2017-11-04 MED ORDER — TRAVASOL 10 % IV SOLN
INTRAVENOUS | Status: DC
  Administered 2017-11-04: 19:00:00 via INTRAVENOUS
  Filled 2017-11-04: qty 600

## 2017-11-04 MED ORDER — MIDAZOLAM HCL 2 MG/2ML IJ SOLN: 2.0000 mg | Freq: Once | INTRAMUSCULAR | Status: DC

## 2017-11-04 MED ORDER — FENTANYL 2500MCG IN NS 250ML (10MCG/ML) PREMIX INFUSION
25.0000 ug/h | INTRAVENOUS | Status: DC
  Administered 2017-11-04 – 2017-11-05 (×2): 100 ug/h via INTRAVENOUS
  Filled 2017-11-04: qty 250

## 2017-11-04 MED ORDER — HEPARIN SODIUM (PORCINE) 5000 UNIT/ML IJ SOLN
5000.0000 [IU] | Freq: Three times a day (TID) | INTRAMUSCULAR | Status: DC
  Administered 2017-11-04: 5000 [IU] via SUBCUTANEOUS
  Filled 2017-11-04 (×2): qty 1

## 2017-11-04 MED ORDER — FENTANYL CITRATE (PF) 100 MCG/2ML IJ SOLN
50.0000 ug | Freq: Once | INTRAMUSCULAR | Status: AC
  Administered 2017-11-04: 50 ug via INTRAVENOUS

## 2017-11-04 MED ORDER — ALBUTEROL SULFATE (2.5 MG/3ML) 0.083% IN NEBU
2.5000 mg | INHALATION_SOLUTION | Freq: Four times a day (QID) | RESPIRATORY_TRACT | Status: DC | PRN
  Administered 2017-11-04: 2.5 mg via RESPIRATORY_TRACT
  Filled 2017-11-04: qty 3

## 2017-11-04 MED ORDER — FENTANYL BOLUS VIA INFUSION
25.0000 ug | INTRAVENOUS | Status: DC | PRN
  Filled 2017-11-04: qty 25

## 2017-11-04 MED ORDER — SODIUM CHLORIDE 0.9 % IV SOLN
2.0000 mg/h | INTRAVENOUS | Status: DC
  Administered 2017-11-04: 2 mg/h via INTRAVENOUS
  Filled 2017-11-04: qty 10

## 2017-11-04 MED ORDER — ETOMIDATE 2 MG/ML IV SOLN
20.0000 mg | Freq: Once | INTRAVENOUS | Status: AC
  Administered 2017-11-04: 20 mg via INTRAVENOUS

## 2017-11-04 MED ORDER — HYDROCORTISONE NA SUCCINATE PF 100 MG IJ SOLR
100.0000 mg | Freq: Three times a day (TID) | INTRAMUSCULAR | Status: DC
  Administered 2017-11-04 (×2): 100 mg via INTRAVENOUS
  Filled 2017-11-04 (×3): qty 2

## 2017-11-04 MED ORDER — FENTANYL CITRATE (PF) 100 MCG/2ML IJ SOLN: 50.0000 ug | Freq: Once | INTRAMUSCULAR | Status: DC

## 2017-11-04 MED ORDER — IOPAMIDOL (ISOVUE-300) INJECTION 61%
INTRAVENOUS | Status: AC
  Administered 2017-11-04: 30 mL
  Filled 2017-11-04: qty 100

## 2017-11-04 MED ORDER — FENTANYL CITRATE (PF) 100 MCG/2ML IJ SOLN: 100.0000 ug | Freq: Once | INTRAMUSCULAR | Status: DC

## 2017-11-04 MED ORDER — MIDAZOLAM BOLUS VIA INFUSION
2.0000 mg | INTRAVENOUS | Status: DC | PRN
  Filled 2017-11-04: qty 2

## 2017-11-04 MED ORDER — SODIUM BICARBONATE 8.4 % IV SOLN
100.0000 meq | Freq: Once | INTRAVENOUS | Status: AC
  Administered 2017-11-04: 100 meq via INTRAVENOUS

## 2017-11-04 MED ORDER — NOREPINEPHRINE BITARTRATE 1 MG/ML IV SOLN
0.0000 ug/min | INTRAVENOUS | Status: DC
  Administered 2017-11-05: 50 ug/min via INTRAVENOUS
  Filled 2017-11-04: qty 16

## 2017-11-04 MED ORDER — MIDAZOLAM HCL 2 MG/2ML IJ SOLN: 2.0000 mg | Freq: Once | INTRAMUSCULAR | Status: DC | PRN

## 2017-11-04 MED ORDER — CHLORHEXIDINE GLUCONATE 0.12% ORAL RINSE (MEDLINE KIT)
15.0000 mL | Freq: Two times a day (BID) | OROMUCOSAL | Status: DC
  Administered 2017-11-04: 15 mL via OROMUCOSAL

## 2017-11-04 MED ORDER — ARTIFICIAL TEARS OPHTHALMIC OINT
1.0000 "application " | TOPICAL_OINTMENT | Freq: Three times a day (TID) | OPHTHALMIC | Status: DC
  Administered 2017-11-04: 1 via OPHTHALMIC
  Filled 2017-11-04: qty 3.5

## 2017-11-04 MED ORDER — SODIUM CHLORIDE 0.9 % IV BOLUS (SEPSIS)
1000.0000 mL | Freq: Once | INTRAVENOUS | Status: AC
  Administered 2017-11-04: 1000 mL via INTRAVENOUS

## 2017-11-04 MED ORDER — CISATRACURIUM BOLUS VIA INFUSION
0.0500 mg/kg | Freq: Once | INTRAVENOUS | Status: AC
  Administered 2017-11-04: 2.8 mg via INTRAVENOUS
  Filled 2017-11-04: qty 3

## 2017-11-04 MED ORDER — SODIUM BICARBONATE 8.4 % IV SOLN
INTRAVENOUS | Status: AC
  Administered 2017-11-04: 50 meq
  Filled 2017-11-04: qty 100

## 2017-11-04 MED ORDER — FENTANYL 2500MCG IN NS 250ML (10MCG/ML) PREMIX INFUSION: 100.0000 ug/h | INTRAVENOUS | Status: DC

## 2017-11-04 MED ORDER — STERILE WATER FOR INJECTION IV SOLN
INTRAVENOUS | Status: DC
  Filled 2017-11-04: qty 600

## 2017-11-04 MED ORDER — SODIUM BICARBONATE 8.4 % IV SOLN
INTRAVENOUS | Status: DC
  Administered 2017-11-04: 19:00:00 via INTRAVENOUS
  Filled 2017-11-04 (×4): qty 150

## 2017-11-04 MED ORDER — NOREPINEPHRINE BITARTRATE 1 MG/ML IV SOLN
0.0000 ug/min | INTRAVENOUS | Status: DC
  Administered 2017-11-04: 50 ug/min via INTRAVENOUS
  Administered 2017-11-04: 40 ug/min via INTRAVENOUS
  Administered 2017-11-04: 10 ug/min via INTRAVENOUS
  Filled 2017-11-04 (×4): qty 4

## 2017-11-04 MED ORDER — FENTANYL BOLUS VIA INFUSION
50.0000 ug | INTRAVENOUS | Status: DC | PRN
  Filled 2017-11-04: qty 50

## 2017-11-04 MED ORDER — SODIUM CHLORIDE 0.9 % IV SOLN: INTRAVENOUS | Status: DC | PRN

## 2017-11-04 MED ORDER — ORAL CARE MOUTH RINSE: 15.0000 mL | Freq: Four times a day (QID) | OROMUCOSAL | Status: DC

## 2017-11-04 MED ORDER — FENTANYL CITRATE (PF) 100 MCG/2ML IJ SOLN: 100.0000 ug | Freq: Once | INTRAMUSCULAR | Status: DC | PRN

## 2017-11-04 MED ORDER — VASOPRESSIN 20 UNIT/ML IV SOLN
0.0300 [IU]/min | INTRAVENOUS | Status: DC
  Administered 2017-11-04: 0.03 [IU]/min via INTRAVENOUS
  Filled 2017-11-04: qty 2

## 2017-11-04 NOTE — Progress Notes (Signed)
Critical WBC 55 and lactic acid 9.2 reported to [facility].  Family at bedside. NP at bedside speaking with family.

## 2017-11-04 NOTE — Significant Event (Signed)
Rapid Response Event Note  Overview: Time Called: 8657 Arrival Time: 8469 Event Type: Respiratory  Initial Focused Assessment: Patient in acute respiratory distress.  Alert and oriented  RT placing patient on Bipap. Per RN patient has recently been up to the chair then returned to bed and began to have acute distress. BP 161/94  HR 147  RR 45  O2 sat 92% on 2L [place],  100% on Bipap Lung sounds coarse and rhonchi Increased WOB  Dr Algis Liming notified and came to bedside  Interventions: Bipap  ABG done PCXR PCCM  Consulted:  Pete NP and Dr Vaughan Browner at bedside Urgently intubated Fentanyl, Versed, Etomidate and Rocuronium given IV (see MAR) OG tube placed PCXR for tube placement.  BP dropped to 107/68  Post medication and intubation.  Then rebounded to 150s/70s  HR 140s  RR18 via ventilator.  O2 sats 100% on 100% Fio2  1320  Patient transported to 81m11 Via bed, with ventilator and heart monitor.  Plan of Care (if not transferred):  Event Summary: Name of Physician Notified: [place] at 1045  Name of Consulting Physician Notified: CCM,  Dr Vaughan Browner and Laurey Arrow NP at 1100  Outcome: Transferred (Comment)  Event End Time: [place]  Raliegh Ip

## 2017-11-04 NOTE — Care Management Note (Signed)
Case Management Note  Patient Details  Name: Danielle [name] MRN: [id number] Date of Birth: December 24, 1945  Subjective/Objective:     Pt s/p VATS, mediastinal mass requiring perc drain                Action/Plan:  PTA from home.  CTs remain in and WBC climbing.  Palliative involved - pt continues to request aggressive care.  TPN discontinued due to ongoing fevers.  Pt will need PT eval when stable.  CM will continue to follow for discharge needs   Expected Discharge Date:  (unknown)               Expected Discharge Plan:     In-House Referral:     Discharge planning Services  CM Consult  Post Acute Care Choice:    Choice offered to:     DME Arranged:    DME Agency:     HH Arranged:    HH Agency:     Status of Service:  In process, will continue to follow  If discussed at Long Length of Stay Meetings, dates discussed:    Additional Comments: 11/04/2017  Pt experienced resp distress requiring intubation - transferred to ICU.  Maryclare Labrador, RN 11/04/2017, 5:54 PM

## 2017-11-04 NOTE — Progress Notes (Signed)
PULMONARY / CRITICAL CARE MEDICINE   Name: Danielle [name] MRN: [id number] DOB: Jun 21, 1946    ADMISSION DATE:  10/25/2017 CONSULTATION DATE:    REFERRING MD:  [email or web address]   CHIEF COMPLAINT:  Acute respiratory failure   HISTORY OF PRESENT ILLNESS:   This is a 71 year old female with a very complicated medical history who is readmitted to [facility] on 11/8 with chief complaint of nausea and vomiting.  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  She was treated with parenteral antifungals.  She eventually was able to ambulate improved to the point where she was discharged she was to be discharged on IV Invanz as well his Eraxis for 2 more weeks via PICC line. She was again readmitted on 11 8 with chief complaint of nausea and vomiting CT of abdomen and pelvis was obtained and this showed reaccumulation of fluid collection in the lower mediastinum as well as bibasilar pleural effusions and another fluid collection along the posterior margin of the spleen.  She ultimately underwent video-assisted thoracotomy with chest tube placement on 11/15 as well as new percutaneous drainage placed in the anterior abdominal abscess on the 11/23.  She has been getting IV nutrition via T and A.  Infectious disease has been following and helping with antibiotics.  On 11/28 she was spiking fever as high as 104.6.  Because of this she had been on [employer], anidulafungin, and meropenem.  We switched to meropenem to aztreonam and Flagyl on 11/27 with  concern for drug fever.  There is also control about concern about adequate source control still.  She apparently had a vomiting episode the evening of 11/28 had been getting progressively tachypneic and developed worsening respiratory failure.  Critical  care was asked to see her on 11/29 when she developed worsening respiratory failure requiring 100%  FiO2 on noninvasive ventilation and still had marked paroxysmal breathing with respiratory rate in the 40s.  A stat chest x-ray demonstrated a new infiltrate involving the right upper middle lobe.  PAST MEDICAL HISTORY :  She  has a past medical history of Crohn disease ([place]), Hiatal hernia (12/07/2015), Left buttock abscess (04/21/2017), Psoriasis, and SBO (small bowel obstruction) ([place]) (07/27/2017).  PAST SURGICAL HISTORY: She  has a past surgical history that includes Bowel resection (2009); Colostomy (2009); ANAL STENOSIS (~ 2010); Colonoscopy (2013); Cholecystectomy (1990's); Appendectomy (1980's); goiter (1999); Esophagogastroduodenoscopy (11/11/2011); Flexible sigmoidoscopy (11/11/2011); Esophagogastroduodenoscopy (05/18/2012); Colostomy takedown (2009); left heart catheterization with coronary angiogram (N/A, 07/09/2014); Proctoscopy (N/A, 01/31/2015); Rectal exam under anesthesia (N/A, 01/31/2015); Colonoscopy with propofol (N/A, 03/12/2017); Esophagogastroduodenoscopy (egd) with propofol (N/A, 03/12/2017); Esophageal dilation (N/A, 03/12/2017); Incision and drainage perirectal abscess (N/A, 04/21/2017); Hiatal hernia repair (N/A, 09/08/2017); Laparoscopic lysis of adhesions (N/A, 09/08/2017); laparotomy (N/A, 09/10/2017); and Video assisted thoracoscopy (vats)/decortication (Left, 11/01/2017).  Allergies  Allergen Reactions  . Zithromax [Azithromycin] Swelling and Other (See Comments)    Mouth swelling and blisters   . Aspirin Other (See Comments)    Stomach cramping   . Ibuprofen Other (See Comments)    Pt states that she prefers not to take this medication.   . Iron Nausea And Vomiting and Other (See Comments)    FERROUS SULFATE  . Sulfa Antibiotics Nausea And Vomiting    No current facility-administered medications on file prior to encounter.  Current Outpatient Medications on File Prior to  Encounter  Medication Sig  . albuterol (PROVENTIL HFA;VENTOLIN HFA) 108 (90 Base) MCG/ACT inhaler Inhale 2 puffs into the lungs every 6 (six) hours as needed for wheezing.  [name] apixaban (ELIQUIS) 5 MG TABS tablet Take 10 mg twice a day for 7 days and then start using 5 mg BID.  [name] cyanocobalamin (,VITAMIN B-12,) 1000 MCG/ML injection INJECT 1000 MCG IM ONCE MONTHLY  . dicyclomine (BENTYL) 10 MG/5ML syrup TAKE 10 MLS (20 MG TOTAL) BY MOUTH 3 (THREE) TIMES DAILY AS NEEDED. (Patient taking differently: Take 20 mg 3 (three) times daily as needed by mouth (stomach cramps). )  . diphenoxylate-atropine (LOMOTIL) 2.5-0.025 MG tablet TAKE 1 TABLET BY MOUTH FOUR TIMES A DAY AS NEEDED (Patient taking differently: Take one tablet twice daily.)  . feeding supplement (BOOST / RESOURCE BREEZE) LIQD Take 1 Container by mouth 3 (three) times daily between meals.  . furosemide (LASIX) 20 MG tablet Take 1 tablet (20 mg total) by mouth daily as needed (Take for weight gain of more than 2 pounds in a day or 5 pounds in 2 days.).  [name] HYDROcodone-acetaminophen (NORCO) 7.5-325 MG tablet Take 1 tablet by mouth every 6 (six) hours as needed for moderate pain or severe pain.  . magnesium oxide (MAG-OX) 400 (241.3 Mg) MG tablet Take 1 tablet (400 mg total) by mouth daily.  [name] nystatin (NYSTATIN) powder Apply 1 g topically 2 (two) times daily as needed (for itching/irritation).  . pantoprazole (PROTONIX) 40 MG tablet Take 1 tablet (40 mg total) by mouth daily.  . prochlorperazine (COMPAZINE) 10 MG tablet Take 1 tablet (10 mg total) by mouth every 6 (six) hours as needed for refractory nausea / vomiting.  . sodium bicarbonate 650 MG tablet Take 650 mg by mouth 2 (two) times daily.  [name] spironolactone (ALDACTONE) 50 MG tablet Take 50 mg by mouth daily.   [name] acetaminophen (TYLENOL) 325 MG tablet Take 2 tablets (650 mg total) by mouth every 6 (six) hours as needed for mild pain or moderate pain. (Patient not taking: Reported on 10/13/2017)   . promethazine (PHENERGAN) 25 MG tablet TAKE 1 TABLET (25 MG TOTAL) BY MOUTH 2 (TWO) TIMES DAILY AS NEEDED FOR NAUSEA OR VOMITING. (Patient not taking: Reported on 11/01/2017)    FAMILY HISTORY:  Her indicated that her mother is deceased. She indicated that her father is deceased. She indicated that her sister is alive. She indicated that only one of her two brothers is alive. She indicated that the status of her paternal aunt is unknown.   SOCIAL HISTORY: She  reports that she quit smoking about 21 years ago. Her smoking use included cigarettes. She has a 0.48 pack-year smoking history. she has never used smokeless tobacco. She reports that she does not drink alcohol or use drugs.  REVIEW OF SYSTEMS:   Unable d/t distress  SUBJECTIVE:  Acutely ill and pre-arrest   VITAL SIGNS: BP 138/74 (BP Location: Right Leg)   Pulse (!) 125   Temp 99.3 F (37.4 C) (Rectal)   Resp (!) 38   Ht 5' 4.5" (1.638 m)   Wt 123 lb 0.3 oz (55.8 kg)   SpO2 100%   BMI 20.79 kg/m    HEMODYNAMICS:    VENTILATOR SETTINGS:    INTAKE / OUTPUT: I/O last 3 completed shifts: In: 5009 [P.O.:120; I.V.:1830; Other:10; IV Piggyback:3460] Out: 2583 [Urine:2450; Drains:10; Stool:100; Chest Tube:23]  PHYSICAL EXAMINATION: General: 71 year old chronically ill appearing female currently in acute  distress. HEENT: Mucous membranes are dry, has a BiPAP mask in place.  No jugular venous distention. pulmonary: Diffuse scattered rhonchi, crackles right greater than left, marked accessory muscle use.  Left chest tube in place, no drainage, no air leak.  Cardiac: Tachycardic, no murmur rub or gallop. Abdomen: Soft, slightly tender, hypoactive bowel sounds.  Percutaneous  Abdominal drain intact extremity/muscular skeletal:Warm, dry, brisk cap refill Neuro/musculoskeletal: Awake, oriented, moves all extremities, appropriate affect. LABS:  BMET Recent Labs  Lab 11/03/17 0002 11/03/17 0500 11/04/17 0500  NA 133*  137 137  K 4.4 3.3* 4.1  CL 109 113* 114*  CO2 15* 18* 14*  BUN 17 19 16   CREATININE 0.84 0.83 0.94  GLUCOSE 88 102* 98    Electrolytes Recent Labs  Lab 11/02/17 0417 11/03/17 0002 11/03/17 0500 11/04/17 0500  CALCIUM 8.2* 7.9* 8.0* 8.4*  MG 2.0  --  1.7 1.7  PHOS 3.6  --  3.9 4.3    CBC Recent Labs  Lab 11/03/17 0002 11/03/17 0500 11/04/17 0500  WBC 29.3* 24.3* 47.5*  HGB 9.5* 8.9* 8.8*  HCT 29.4* 27.9* 27.1*  PLT 345 295 309    Coag's Recent Labs  Lab 10/29/17 1115  INR 1.24    Sepsis Markers Recent Labs  Lab 10/31/17 0145 11/03/17 0002 11/03/17 0500  LATICACIDVEN 1.1 2.2* 1.7    ABG Recent Labs  Lab 11/04/17 1051  PHART 7.302*  PCO2ART 27.0*  PO2ART 105    Liver Enzymes Recent Labs  Lab 11/01/17 0230 11/04/17 0500  AST 52* 45*  ALT 37 30  ALKPHOS 149* 146*  BILITOT 0.5 0.5  ALBUMIN 1.5* 1.4*    Cardiac Enzymes No results for input(s): TROPONINI, PROBNP in the last 168 hours.  Glucose Recent Labs  Lab 11/01/17 2000 11/02/17 0150 11/02/17 0743 11/02/17 1208 11/03/17 2140 11/04/17 0748  GLUCAP 101* 110* 99 110* 71 87    Imaging Dg Chest Port 1 View  Result Date: 11/04/2017 CLINICAL DATA:  Shortness of breath.  Abnormal breath sounds. EXAM: PORTABLE CHEST 1 VIEW COMPARISON:  10/28/2017. FINDINGS: Left subclavian lines are in stable position with 1 line again projecting into the SVC in the other into the azygos vein. Mediastinal drainage catheter in stable position. Stable cardiomegaly. Increase infiltrate/ edema right lung. Mild improvement of left upper lobe infiltrate/edema. Small bilateral pleural effusions. IMPRESSION: 1. Lines and tubes in stable position. 2. Increase infiltrates/edema right lung. Mild improvement of left upper lobe infiltrate/edema. Small bilateral pleural effusions. 3. Stable cardiomegaly . Electronically Signed   By: Marcello Moores  Register   On: 11/04/2017 11:07     STUDIES:  11/22 US renal: Limited study  as patient refused to allow imaging of the left flank. Right kidney appears unremarkable. Urinary bladder decompressed with Foley catheter and cannot be assessed. 11/22 CT abd/pelvis: Near complete resolution of inferior mediastinal fluid collection/abscess surrounding the distal esophagus and small hiatal hernia and decreased small left pleural effusion, following chest tube placement. Increased moderate right pleural effusion and right basilar atelectasis. Mild increase in wall thickening and adjacent inflammatory changes involving the distal ileum, with mild dilatation of several small bowel loops proximal to this site. This is consistent with Crohn's disease and stricture formation. Stable small rim enhancing fluid collections along posterosuperior margin of the spleen and pelvic cul-de-sac, suspicious for Abscesses. Left-sided transsphincteric perianal fistula.  CULTURES: 11/12 gi panel: neg 11/8 BCX2: neg Pleural fluid 11.12: neg Blood culture 11/12: neg UC 11/14: neg  UC 11/14: neg Pleural afb 11/15:  neg Left pleural 1/15: neg bc x2 11/13: neg Fungal stain 11/15: >>> BCX2 11/18: neg UC 11/21: neg UC 11/21: neg Cdiff 11/23: neg bcx2 11/21: neg Surgical abd abscess 11/23: rare budding yeast. Rare gpc  bc x 2 11/25>>  ANTIBIOTICS: Anidulafungin 11/9>>> Zosyn 11/9>>>11/21 vanc 11/9>>>11/25 zyvox 11/25>>> Meropenem 11/21>>> Flagyl 11/27>> azactam 11/27>>  SIGNIFICANT EVENTS: Admitted 11/8 Video-assisted thoracotomy 11/15  LINES/TUBES: oett 11/29>>> Left chest tube 11/15>>> Perc abd drain 11/23  DISCUSSION: Admitted w/ post-op abscess. Now w/ new fever and aspiration on top of on-going sepsis. Intubated 11/29 for aspiration event. S/p VATS 11/15 and perc drain abd to abd abscess 11/23. Will scan chest abd and pelvis to r/o new possible source other than aspiration given fever and clinical decline onset was before the acute aspiration event. Defer abx to ID. Surgery  following.   ASSESSMENT / PLAN:  PULMONARY A: Acute hypoxic respiratory failure in setting of acute aspiration event/HCAP S/p recent VATS for mediastinal abscess w/ Left CT placement 11/15 Pcxr: ett good position. Left chest tube in satisfactory position  New right sided airspace disease. Failed NIPPV  P:   Intubate/ventilate PAD protocol  VAP protocol F/u abg and cxr See ID section  Chest tube per thoracic surgery   CARDIOVASCULAR A:  Sinus tachycardia On-going sepsis  P:  Maintain euvolemia rx fever  Cont tele   RENAL A:   Acute NAG metabolic acidosis  Hyperchloremia  P:   Avoid hypotension Renal dose meds  Cont I&O monitoring  F/u renal fxn in am   GASTROINTESTINAL A:   Post-op ileus Nausea and vomiting  Severe protein calorie malnutrition  P:   NPO NGT to LIWS Cont NPO and TPN per pharmacy   HEMATOLOGIC A:   Anemia of critical illness w/out evidence of bleeding  P:  Trend cbc Transfuse per protocol   INFECTIOUS A:   Fungemia  Recurrent post -op abd abscess s/p perc drain 11/23 Mediastinal abscess s/p VATS w/ left CT 11/15 New fever w/ acute aspiration event 11/29 On-going sepsis  P:   Cont abx per ID service CT chest/abd/pelvis once stable. Looking for new potential infectious source   ENDOCRINE A:   No acute  P:   Trend cbg  NEUROLOGIC A:   No acute  P:   RASS goal: -2 PAD protocol on vent    FAMILY  - Updates: sister via phone 11/29  - Inter-disciplinary family meet or Palliative Care meeting due by:  Dec 5  My cct 40 min   Erick Colace ACNP-BC [place] Pager # [phone] OR # [phone] if no answer   11/04/2017, 11:20 AM

## 2017-11-04 NOTE — Progress Notes (Signed)
Suicide  precautions initiated. Sitter at the bedside. Precautions are discussed with patient. Patient is teary eyed and reassurance is provided. Patient sister is on her way to hospital to talk with patient.

## 2017-11-04 NOTE — Procedures (Signed)
Arterial Catheter Insertion Procedure Note DANAYAH SMYRE [id number] [birth year]  Procedure: Insertion of Arterial Catheter  Indications: Blood pressure monitoring and Frequent blood sampling  Procedure Details Consent: Unable to obtain consent because of emergent medical necessity. Time Out: Verified patient identification, verified procedure, site/side was marked, verified correct patient position, special equipment/implants available, medications/allergies/relevent history reviewed, required imaging and test results available.  Performed  Maximum sterile technique was used including antiseptics, cap, gloves, gown, hand hygiene, mask and sheet. Skin prep: Chlorhexidine; local anesthetic administered 20 gauge catheter was inserted into right femoral artery using the Seldinger technique.  Evaluation Blood flow good; BP tracing good. Complications: No apparent complications.   Georgann Housekeeper, AGACNP-BC [facility] Pulmonology/Critical Care Pager [phone] or [phone]  11/04/2017 11:15 PM

## 2017-11-04 NOTE — Progress Notes (Signed)
PROGRESS NOTE    Danielle [name]  OIZ:[id number] DOB: 15-Feb-1946 DOA: 10/12/2017 PCP: Iona Beard, MD    Brief Narrative: 71 y.o.F w/ a Hxof Crohn's disease, hiatal hernia,psoriasis, and SBO who underwent a type IV hiatal hernia repair in October 2018.Postoperatively the patient required endotracheal intubation, mechanical ventilation and the use of pressors. She was extubated on postop day #2. She improved clinically and was on parenteral nutrition, however she developed a pelvic fluid collection which required a drain. On 09/11/2017 she had blood cultures positive for Candida glabrata, and the pelvic abscess culture was positive for C. Albicans and C. Glabrata. She was given treatment withparenteral antifungals. She subsequently improved, was able to ambulate with physical therapy, weaned off of parenteral nutrition and was tolerating oral diet. ID recommended to continue Invanz andErasisfor 2 weeks. A PICC line was placed in her right arm and she was ultimately discharged home.  She returned on 10/01/2017 due to intermittent fevers. She was treated empirically and clinically improved to the point that she was able to be d/c home again on 11/1.  She returned to the ED 11/8 with complaints of persistent nausea associated with emesis. CT abdomen pelvis showed re accumulation of fluid collection in the lower mediastinum, bibasilar pleural effusion, another small complex collection along the posterior margin of the spleen. Patient underwent left video assisted thoracoscopy for mediastinal drainage of mediastinal abscess. ID has been helping with management of IV antibiotics.   Patient continue to spike fevers, persistent nausea and vomiting. CT abdomen pelvis; near complete resolution of mediastinal abscess. Stable small rim enhancing fluid collections along posterosuperior margin of the spleen and pelvic cul-de-sac, suspicious for abscesses. She underwent  CT guide placement of 10 Fr  drain in anterior abdominal abscess on 11-23. [name] Fluid growing yeast and Gram positive cocci in pairs. Started on Zyvox. WBC now started to decreased, still febrile.   Palliative care team met with patient and her family. Patients wants to continue with aggressive care.    Assessment & Plan:   Principal Problem:   Septic shock ([place]) Active Problems:   Leukocytosis   Perianal Crohn's disease, with fistula ([place])   Acute renal failure superimposed on stage 3 chronic kidney disease (HCC)   Hiatal hernia   Immunosuppressed status (HCC)   Chest tube in place   Hypotension   Vomiting in adult patient   S/P thoracentesis   Anasarca   DVT (deep venous thrombosis) (HCC)   Protein-calorie malnutrition, moderate (HCC)   Mediastinal abscess (HCC)   Mediastinal post-op seromas   Pleural effusion, left   Patient's noncompliance with intervention / medical advice    Dehydration   Acute embolism and thrombosis of left axillary vein (HCC)   Goals of care, counseling/discussion   Palliative care encounter   Fever   Intra-abdominal infection   Hemoptysis   Abscess   Intractable vomiting with nausea  Acute respiratory failure with hypoxia Noted on morning of 11/04/17. Reports of nonbloody emesis overnight. Suspicious for aspiration pneumonia. Rapid response at bedside. I was paged and arrived immediately at bedside and evaluated patient. She had been placed on BiPAP. Discussed with RN and rapid response RN. ABG was being drawn. Patient tachypnea. Alert and oriented. Sinus tachycardia in the 150s. I consulted CCM who arrived at bedside. I discussed with Mr. Kary Kos. Patient transferred to ICU and subsequently intubated. Care taken over by CCM service on 11/29. TRH will sign off at this time and will be available to re asssume care when  patient stabilized.   SevereSepsis/Mediastinal abscess/intra-abdominal abscess; Patient underwent esophagogram showing 2.5 cm area of extrinsic compression upon  esophagus, underwent also thoracentesis. Case was reviewed by IR and gastroenterologist, recommendation was for CVTS consult.  -S/P VATS drainage on 11/15. Patient continue to spike fever, leukocytosis. .  Repeated CT scan 11-19; Ill-defined multiloculated rim enhancing fluid collection and extensive edema in posterior mediastinum inferior to mediastinal drain and anterior to aorta, probably representing the residual of drained abscess. Discrete 14 mm abscess present in the right posterior mediastinum. Multiloculated rim enhancing fluid collection surrounding the spleen and extending into left pericolic gutter, likely abscess. Small bilateral pleural effusions with left pleural drain. Stable dependent lower lobe consolidations may represent atelectasis or pneumonia. -Per CVTS mediastinal drain in good position, still has phlegmon in the area.  -CT abdomen pelvis; near complete resolution of mediastinal abscess. Stable small rim enhancing fluid collections along posterosuperior margin of the spleen and pelvic cul-de-sac, suspicious for abscesses. -underwent CT guide placement of 10 Fr drain in anterior abdominal abscess. Fluid growing yeast and Gram positive cocci in pairs.  - ID following. Continue Anidulafungin and linezolid. Due to concern that her fevers are from beta lactams & Carbepenams, ID stopped meropenem and changed to aztreonam + metronidazole on 11/27. - As per thoracic surgery, will likely need chest CT scan before mediastinal drain is removed. Minimal drainage from drain recorded. - After above change in antibiotics, fevers better but marked jump in leukocytosis. Discussed with ID, checking C. difficile.  Right side pleural effusion;  [place] perform not significant fluid to be removed.   Persistent fevers, leukocytosis;  Multifactorial, abdominal abscess, mediastinal abscess versus drug fever. Please see discussion above.  Type IV hiatal hernia repair in October 2018. Post operative pelvic  abscess;  -CT chest 11-19. For Multiloculated rim enhancing fluid collection surrounding the spleen and extending into left pericolic gutter, likely abscess. -CT abdomen and pelvis;  near complete resolution of mediastinal abscess. Stable small rim enhancing fluid collections along posterosuperior margin of the spleen and pelvic cul-de-sac, suspicious for abscesses. -underwent CT guide placement of 10 Fr drain in anterior abdominal abscess. Fluid growing yeast and Gram positive cocci in pairs. Blood cultures 2 from 11/25 and abdominal abscess culture from 11/23: Negative to date. Started Zyvox. continue aztreonam + metronidazole.  Known candida galbatra infection; culture 10-12, pelvis abscess; on Eraxis. ID following  Repeated blood culture 11-25 no growth to date  History of Crohns/ chronic diarrhea  C diff 11-10 was negative.  CT abdomen with Mild increase in wall thickening and adjacent inflammatory changes involving the distal ileum, with mild dilatation of several small bowel loops proximal to this site. This is consistent with Crohn's disease and stricture formation. -C diff negative.  -No candidate for treatment for Crohn's  per GI.  - Still has rectal tube with ongoing diarrhea. Marked leukocytosis. Checking repeat C. difficile.  Urinary Retention; continue with foley catheter; will need voiding trial when overall clinically improved..   Nutrition;  Nutrition started on TPN.  Patient with a recent fungemia with candida glabrata and albicans.  Per Dr Baxter Flattery ok to continue with TPN for now.  Per Dr Rosendo Gros; oral trial. He doubt G tube will be stable.  Follow Sx recommendation regarding Jejunostomy tube  Anemia; no further hematemesis.  Hb slowly trending down. Monitor daily. Stable.  Chronic diastolic CHF Compensated.   Recent Hematemesis/hemoptysis -stopped IV heparin  -lateral upper extremity DVT 09/2017.  -positive DVT in the left radial vein at this  time -no recurrent  hematemesis or hemoptysis. IV Protonix drip -CT scan of the chest shows evidence of esophageal wall thickening consistent with esophagitis -GI signed off several days ago.   RUE DVT; holding heparin due to hematemesis.  When hb stable plan to resume heparin if clear by GI. GI signed off on 11/25. I communicated with CCM team that decision regarding anticoagulation is pending and has to be determined.  Suicidal ideations - Reported on 11/28 night. Safety sitter at bedside. Psychiatric consulted.  Non-anion gap metabolic acidosis - Bicarbonate drip. Likely related to diarrhea and compensation from respiratory issue.    DVT prophylaxis: SCD.  Code Status: full code.  Family Communication: None at bedside. Disposition Plan: Not medically stable for discharge. Transferred to ICU and intubated on 11/29.   Consultants:   General surgery  CVTS  GI   Procedures: events; Significant Events: 11/8 readmit 11/11 PICC placed 11/12 thoracentesis 11/5 to OR at [facility] for L VATS for mediastinal drainage  Patient has left IJ, LUE decline, Foley catheter, rectal tube, left chest tube and percutaneous abdominal drain.     Antimicrobials; antifungal  Erasix 10-16-2017  Meropenem 10-27-2017-discontinued.  Aztreonam + metronidazole 11/27 >.   Subjective: Overnight events noted, suicidal. Suicide sitter initiated. When I first saw her early this morning, she was sitting on a chair. Reported some nausea and and nonbloody vomiting overnight but said that she was "fair". She did report dyspnea on minimal exertion from moving from bed to chair. She actually denied pain anywhere.   I was called approximately 2 hours later due to worsening dyspnea, tachycardia. When I arrived to bedside, she was on BiPAP.  Objective: Vitals:   11/04/17 1645 11/04/17 1700 11/04/17 1715 11/04/17 1730  BP: (!) 57/47 (!) 65/42 (!) 80/46 (!) 80/55  Pulse:   (!) 108   Resp: (!) 33 (!) 21 (!) 27 (!) 28    Temp:      TempSrc:      SpO2:   100%   Weight:      Height:        Intake/Output Summary (Last 24 hours) at 11/04/2017 1825 Last data filed at 11/04/2017 1732 Gross per 24 hour  Intake 3400.83 ml  Output 2648 ml  Net 752.83 ml   Filed Weights   10/15/17 1449 10/20/17 1600 10/31/2017 0500  Weight: 55.9 kg (123 lb 3.8 oz) 55.3 kg (121 lb 14.6 oz) 55.8 kg (123 lb 0.3 oz)    Examination:  General exam: Chronic ill appearing. Middle-aged female small built, frail and cachecticLying propped up in bed with tachypnea and BiPAP in place . Respiratory system; reduced breath sounds right >left with scattered crackles mostly in right lung fields but no wheezing or rhonchi. Tachypnea. On BiPAP. Increased work of breathing.  Cardiovascular system: S1 and S2 heard, regular tachycardic. No JVD, murmurs or pedal edema. Telemetry: sinus tachycardia in the 150s noted during rapid response. Gastrointestinal system: BS present, soft. midline abdominal wound healing. Drain mid  quadrant.  Central nervous system: Alert and oriented 3. No focal neurological deficits. Extremities: no edema.    Data Reviewed: I have personally reviewed following labs and imaging studies  CBC: Recent Labs  Lab 11/01/17 0230 11/02/17 0417 11/03/17 0002 11/03/17 0500 11/04/17 0500  WBC 20.7* 19.5* 29.3* 24.3* 47.5*  NEUTROABS 14.5*  --   --  21.4* 43.2*  HGB 9.1* 8.7* 9.5* 8.9* 8.8*  HCT 27.5* 27.4* 29.4* 27.9* 27.1*  MCV 84.6 84.6 84.5 83.5 84.2  PLT  331 329 345 295 314   Basic Metabolic Panel: Recent Labs  Lab 10/30/17 2156 10/31/17 0429 11/01/17 0230 11/02/17 0417 11/03/17 0002 11/03/17 0500 11/04/17 0500  NA 131*  --  133* 133* 133* 137 137  K 5.1  --  3.7 3.2* 4.4 3.3* 4.1  CL 107  --  108 107 109 113* 114*  CO2 17*  --  19* 20* 15* 18* 14*  GLUCOSE 129*  --  109* 101* 88 102* 98  BUN 18  --  22* _0 CREATININE 0.95  --  0.87 0.77 0.84 0.83 0.94  CALCIUM 8.5*  --  8.2* 8.2* 7.9* 8.0*  8.4*  MG 1.9  --  2.0 2.0  --  1.7 1.7  PHOS  --  3.9 4.6 3.6  --  3.9 4.3   GFR: Estimated Creatinine Clearance: 49.8 mL/min (by C-G formula based on SCr of 0.94 mg/dL). Liver Function Tests: Recent Labs  Lab 11/01/17 0230 11/04/17 0500  AST 52* 45*  ALT 37 30  ALKPHOS 149* 146*  BILITOT 0.5 0.5  PROT 7.2 6.6  ALBUMIN 1.5* 1.4*   No results for input(s): LIPASE, AMYLASE in the last 168 hours. No results for input(s): AMMONIA in the last 168 hours. Coagulation Profile: Recent Labs  Lab 10/29/17 1115  INR 1.24   Cardiac Enzymes: No results for input(s): CKTOTAL, CKMB, CKMBINDEX, TROPONINI in the last 168 hours. BNP (last 3 results) No results for input(s): PROBNP in the last 8760 hours. HbA1C: No results for input(s): HGBA1C in the last 72 hours. CBG: Recent Labs  Lab 11/02/17 0743 11/02/17 1208 11/03/17 2140 11/04/17 0748 11/04/17 1604  GLUCAP 99 110* 71 87 74   Lipid Profile: No results for input(s): CHOL, HDL, LDLCALC, TRIG, CHOLHDL, LDLDIRECT in the last 72 hours. Thyroid Function Tests: No results for input(s): TSH, T4TOTAL, FREET4, T3FREE, THYROIDAB in the last 72 hours. Anemia Panel: No results for input(s): VITAMINB12, FOLATE, FERRITIN, TIBC, IRON, RETICCTPCT in the last 72 hours. Sepsis Labs: Recent Labs  Lab 10/30/17 2320 10/31/17 0145 11/03/17 0002 11/03/17 0500 11/04/17 1532  PROCALCITON  --   --   --   --  6.72  LATICACIDVEN 1.0 1.1 2.2* 1.7  --     Recent Results (from the past 240 hour(s))  Culture, blood (routine x 2)     Status: None   Collection Time: 10/27/17  1:19 PM  Result Value Ref Range Status   Specimen Description BLOOD RIGHT HAND  Final   Special Requests IN PEDIATRIC BOTTLE Blood Culture adequate volume  Final   Culture NO GROWTH 5 DAYS  Final   Report Status 11/01/2017 FINAL  Final  Culture, blood (routine x 2)     Status: None   Collection Time: 10/27/17  1:24 PM  Result Value Ref Range Status   Specimen Description  BLOOD RIGHT HAND  Final   Special Requests IN PEDIATRIC BOTTLE Blood Culture adequate volume  Final   Culture NO GROWTH 5 DAYS  Final   Report Status 11/01/2017 FINAL  Final  Culture, Urine     Status: None   Collection Time: 10/27/17  4:44 PM  Result Value Ref Range Status   Specimen Description URINE, RANDOM  Final   Special Requests NONE  Final   Culture NO GROWTH  Final   Report Status 10/28/2017 FINAL  Final  Aerobic/Anaerobic Culture (surgical/deep wound)     Status: None   Collection Time: 10/29/17  4:17 PM  Result Value Ref Range Status   Specimen Description ABSCESS  Final   Special Requests ABDOMEN  Final   Gram Stain   Final    ABUNDANT WBC PRESENT, PREDOMINANTLY PMN RARE GRAM POSITIVE COCCI IN PAIRS RARE BUDDING YEAST SEEN    Culture No growth aerobically or anaerobically.  Final   Report Status 11/04/2017 FINAL  Final  C difficile quick scan w PCR reflex     Status: None   Collection Time: 10/29/17  4:25 PM  Result Value Ref Range Status   C Diff antigen NEGATIVE NEGATIVE Final   C Diff toxin NEGATIVE NEGATIVE Final   C Diff interpretation No C. difficile detected.  Final  Culture, blood (x 2)     Status: None (Preliminary result)   Collection Time: 10/31/17  2:06 AM  Result Value Ref Range Status   Specimen Description BLOOD LEFT FOOT  Final   Special Requests IN PEDIATRIC BOTTLE Blood Culture adequate volume  Final   Culture NO GROWTH 4 DAYS  Final   Report Status PENDING  Incomplete  Culture, blood (x 2)     Status: None (Preliminary result)   Collection Time: 10/31/17  2:12 AM  Result Value Ref Range Status   Specimen Description BLOOD RIGHT FOOT  Final   Special Requests IN PEDIATRIC BOTTLE Blood Culture adequate volume  Final   Culture NO GROWTH 4 DAYS  Final   Report Status PENDING  Incomplete         Radiology Studies: Dg Chest Port 1 View  Result Date: 11/04/2017 CLINICAL DATA:  Repositioning of OGT EXAM: PORTABLE CHEST 1 VIEW COMPARISON:   Portable exam 1207 hours compared 1131 hours FINDINGS: Tip of endotracheal tube projects 1.8 cm above carina. Nasogastric tube now extends into stomach. LEFT thoracostomy tube again seen. LEFT arm PICC line tip projects deflects into azygos vein. LEFT jugular line tip projects over SVC at azygos confluence. Pigtail drainage catheter RIGHT upper quadrant. Normal heart size mediastinal contours. BILATERAL perihilar infiltrates greater on RIGHT. Dense atelectasis versus consolidation LEFT lower lobe. No definite pleural effusion or pneumothorax. Bones appear to be diffusely demineralized. IMPRESSION: Nasogastric tube now extends into stomach. Remaining line and tube positions as above. Persistent BILATERAL perihilar infiltrates greater on RIGHT with persistent atelectasis versus consolidation in LEFT lower lobe. Electronically Signed   By: Lavonia Dana M.D.   On: 11/04/2017 12:45   Dg Chest Port 1 View  Result Date: 11/04/2017 CLINICAL DATA:  71 year old female new intubation. OG tube partially advanced at the time of this image. EXAM: PORTABLE CHEST 1 VIEW COMPARISON:  1045 hours today and earlier. FINDINGS: Portable AP semi upright view at 1131 hours. Endotracheal tube tip in good position between the level the clavicles and carina. Stable left subclavian region approach and superimposed left upper extremity vascular catheters. An enteric tube has been placed, the tip projects about 1 vertebral body below the carina at this time. Below that a mediastinal drain is stable. Larger lung volumes. Stable cardiac size and mediastinal contours. Dense retrocardiac and left lung base opacity persists. No pneumothorax. Patchy and confluent right perihilar opacity persists. Paucity of bowel gas in the upper abdomen. IMPRESSION: 1. Endotracheal tube tip in good position. 2. Enteric tube tip at the midthoracic esophagus level. The clinical team knows to advance this tube and is going to obtain a follow-up image. 3.  Otherwise  stable lines and tubes. 4. Stable ventilation. Confluent right perihilar opacity persists, suspicious for aspiration versus pneumonia. Electronically Signed  By: Genevie Ann M.D.   On: 11/04/2017 11:59   Dg Chest Port 1 View  Result Date: 11/04/2017 CLINICAL DATA:  Shortness of breath.  Abnormal breath sounds. EXAM: PORTABLE CHEST 1 VIEW COMPARISON:  10/28/2017. FINDINGS: Left subclavian lines are in stable position with 1 line again projecting into the SVC in the other into the azygos vein. Mediastinal drainage catheter in stable position. Stable cardiomegaly. Increase infiltrate/ edema right lung. Mild improvement of left upper lobe infiltrate/edema. Small bilateral pleural effusions. IMPRESSION: 1. Lines and tubes in stable position. 2. Increase infiltrates/edema right lung. Mild improvement of left upper lobe infiltrate/edema. Small bilateral pleural effusions. 3. Stable cardiomegaly . Electronically Signed   By: Marcello Moores  Register   On: 11/04/2017 11:07        Scheduled Meds: . chlorhexidine  15 mL Mouth Rinse BID  . chlorhexidine gluconate (MEDLINE KIT)  15 mL Mouth Rinse BID  . fentaNYL (SUBLIMAZE) injection  50 mcg Intravenous Once  . hydrocortisone sod succinate (SOLU-CORTEF) inj  100 mg Intravenous Q8H  . mouth rinse  15 mL Mouth Rinse q12n4p  . pantoprazole  40 mg Intravenous Q12H  . sodium chloride flush  5 mL Intravenous Q8H   Continuous Infusions: . sodium chloride 10 mL/hr at 11/04/17 1400  . anidulafungin 100 mg (11/04/17 1732)  . aztreonam Stopped (11/04/17 1353)  . fentaNYL infusion INTRAVENOUS 50 mcg/hr (11/04/17 1700)  . metronidazole Stopped (11/04/17 1731)  . norepinephrine (LEVOPHED) Adult infusion 10 mcg/min (11/04/17 1712)  . potassium chloride Stopped (11/02/17 1019)  .  sodium bicarbonate  infusion 1000 mL    . TPN ADULT (ION)    . vancomycin Stopped (11/04/17 1356)     LOS: 21 days    Vernell Leep, MD, FACP, [facility]. [facility] Pager [phone]  If  7PM-7AM, please contact night-coverage [email or web address] Password [facility] 11/04/2017, 6:25 PM

## 2017-11-04 NOTE — Progress Notes (Addendum)
[facility] for Infectious Disease    Date of Admission:  10/23/2017   Total days of antibiotics 21          ID: Danielle [name] is a 71 y.o. female with hiatal hernia IV repair c/b leak, mediastinal abscess/pelvic and peri-spleen abscess s/p VATS Principal Problem:   Septic shock ([place]) Active Problems:   Leukocytosis   Perianal Crohn's disease, with fistula (HCC)   Acute renal failure superimposed on stage 3 chronic kidney disease ([place])   Hiatal hernia   Immunosuppressed status ([place])   Chest tube in place   Hypotension   Vomiting in adult patient   S/P thoracentesis   Anasarca   DVT (deep venous thrombosis) (HCC)   Protein-calorie malnutrition, moderate (HCC)   Mediastinal abscess (HCC)   Mediastinal post-op seromas   Pleural effusion, left   Patient's noncompliance with intervention / medical advice    Dehydration   Acute embolism and thrombosis of left axillary vein (HCC)   Goals of care, counseling/discussion   Palliative care encounter   Fever   Intra-abdominal infection   Hemoptysis   Abscess   Intractable vomiting with nausea    Subjective: Had episodes of vomiting yesterday and this morning is short of breath, with RR 35 though she is afebrile.  No other positive cultures Medications:  . chlorhexidine  15 mL Mouth Rinse BID  . chlorhexidine gluconate (MEDLINE KIT)  15 mL Mouth Rinse BID  . fentaNYL (SUBLIMAZE) injection  50 mcg Intravenous Once  . mouth rinse  15 mL Mouth Rinse q12n4p  . pantoprazole  40 mg Intravenous Q12H  . sodium chloride flush  5 mL Intravenous Q8H    Objective: Vital signs in last 24 hours: Temp:  [98.6 F (37 C)-100.7 F (38.2 C)] 99.3 F (37.4 C) (11/29 0746) Pulse Rate:  [110-145] 137 (11/29 1400) Resp:  [16-47] 17 (11/29 1400) BP: (93-165)/(53-94) 130/67 (11/29 1400) SpO2:  [66 %-100 %] 100 % (11/29 1400) FiO2 (%):  [45 %-100 %] 60 % (11/29 1423) Physical Exam  Constitutional:  oriented to person, place, and time.  appears well-developed and well-nourished. In mild distress.  HENT: [place]/AT, PERRLA, no scleral icterus Neck: accessory muscle use, left IJ Mouth/Throat: Oropharynx is clear and moist. No oropharyngeal exudate.  Cardiovascular: tachycardic, regular rhythm and normal heart sounds. Exam reveals no gallop and no friction rub.  No murmur heard.  Pulmonary/Chest: course rhonchi throughout with occ wheezing Abdominal: Soft. Bowel sounds are normal.  exhibits no distension. There is no tenderness.  Ext: left arm picc line in place   Lab Results Recent Labs    11/03/17 0500 11/04/17 0500  WBC 24.3* 47.5*  HGB 8.9* 8.8*  HCT 27.9* 27.1*  NA 137 137  K 3.3* 4.1  CL 113* 114*  CO2 18* 14*  BUN 19 16  CREATININE 0.83 0.94   Liver Panel Recent Labs    11/04/17 0500  PROT 6.6  ALBUMIN 1.4*  AST 45*  ALT 30  ALKPHOS 146*  BILITOT 0.5    Microbiology: Blood cx ngtd to date from 11/25 Studies/Results: Dg Chest Port 1 View  Result Date: 11/04/2017 CLINICAL DATA:  Repositioning of OGT EXAM: PORTABLE CHEST 1 VIEW COMPARISON:  Portable exam 1207 hours compared 1131 hours FINDINGS: Tip of endotracheal tube projects 1.8 cm above carina. Nasogastric tube now extends into stomach. LEFT thoracostomy tube again seen. LEFT arm PICC line tip projects deflects into azygos vein. LEFT jugular line tip projects over SVC at  azygos confluence. Pigtail drainage catheter RIGHT upper quadrant. Normal heart size mediastinal contours. BILATERAL perihilar infiltrates greater on RIGHT. Dense atelectasis versus consolidation LEFT lower lobe. No definite pleural effusion or pneumothorax. Bones appear to be diffusely demineralized. IMPRESSION: Nasogastric tube now extends into stomach. Remaining line and tube positions as above. Persistent BILATERAL perihilar infiltrates greater on RIGHT with persistent atelectasis versus consolidation in LEFT lower lobe. Electronically Signed   By: Lavonia Dana M.D.   On: 11/04/2017  12:45   Dg Chest Port 1 View  Result Date: 11/04/2017 CLINICAL DATA:  71 year old female new intubation. OG tube partially advanced at the time of this image. EXAM: PORTABLE CHEST 1 VIEW COMPARISON:  1045 hours today and earlier. FINDINGS: Portable AP semi upright view at 1131 hours. Endotracheal tube tip in good position between the level the clavicles and carina. Stable left subclavian region approach and superimposed left upper extremity vascular catheters. An enteric tube has been placed, the tip projects about 1 vertebral body below the carina at this time. Below that a mediastinal drain is stable. Larger lung volumes. Stable cardiac size and mediastinal contours. Dense retrocardiac and left lung base opacity persists. No pneumothorax. Patchy and confluent right perihilar opacity persists. Paucity of bowel gas in the upper abdomen. IMPRESSION: 1. Endotracheal tube tip in good position. 2. Enteric tube tip at the midthoracic esophagus level. The clinical team knows to advance this tube and is going to obtain a follow-up image. 3.  Otherwise stable lines and tubes. 4. Stable ventilation. Confluent right perihilar opacity persists, suspicious for aspiration versus pneumonia. Electronically Signed   By: Genevie Ann M.D.   On: 11/04/2017 11:59   Dg Chest Port 1 View  Result Date: 11/04/2017 CLINICAL DATA:  Shortness of breath.  Abnormal breath sounds. EXAM: PORTABLE CHEST 1 VIEW COMPARISON:  10/28/2017. FINDINGS: Left subclavian lines are in stable position with 1 line again projecting into the SVC in the other into the azygos vein. Mediastinal drainage catheter in stable position. Stable cardiomegaly. Increase infiltrate/ edema right lung. Mild improvement of left upper lobe infiltrate/edema. Small bilateral pleural effusions. IMPRESSION: 1. Lines and tubes in stable position. 2. Increase infiltrates/edema right lung. Mild improvement of left upper lobe infiltrate/edema. Small bilateral pleural effusions. 3.  Stable cardiomegaly . Electronically Signed   By: Marcello Moores  Register   On: 11/04/2017 11:07     Assessment/Plan: Acute respiratory distress = ordered stat cxr and RT to evaluate for either needing supp O2/bipap vs nebulizer treatment. Has emesis bag at bedside, concerning for aspiration pneumonia. Currently on broad spectrum abtx. Agree with pccm assessing for intubation  Leukocytosis = marked increase in WBC from yesterday, possibly from pulmonary status vs. cdifficile since still has rectal tube in place, though on TPN. Would check cdiff  FUO = she is afebrile for the first 24hr since switching to aztreo and metronidazole  Polymicrobial mediastinal/intra-abdominal infection 2/2 hiatal hernia surgery = prior cx grew c.glabrata and c.albicans for which she is on anidulafungin (S to anidula DD with MIC of 32 to fluc) And is also empiric gram-neg/gram pos/anaerobic coverage with linezolid, aztreo and metronidazole  -still has chest tubes in place. Defer to CT surgery to when to remove. Output is low  Will stop linezolid since no VRE and switch back to vancomycin  Malnutrition/uanble to tolerate oral intake = continues to be on [place], [facility] for Infectious Diseases Cell: [phone] Pager: [phone]  11/04/2017, 2:35 PM

## 2017-11-04 NOTE — Progress Notes (Signed)
Recruitment maneuver preformed at 1139 for 2 minutes. Patient remained hemodynamically stable throughout the 2 minutes.

## 2017-11-04 NOTE — Progress Notes (Addendum)
Re round in ICU  Blood pressure (!) 87/56, pulse (!) 134, temperature 99 F (37.2 C), temperature source Core, resp. rate (!) 34, height 5\' 6"  (1.676 m), weight 123 lb 0.3 oz (55.8 kg), SpO2 (!) 89 %. Gen:      No acute distress HEENT:  EOMI, sclera anicteric, ETT in place Neck:     No masses; no thyromegaly Lungs:    Clear to auscultation bilaterally; normal respiratory effort CV:         Regular rate and rhythm; no murmurs Abd:      + bowel sounds; soft, non-tender; no palpable masses, no distension Ext:    No edema; adequate peripheral perfusion Skin:      Warm and dry; no rash Neuro: Sedated, unresponsive  ABG reviewed 7.14/31/71/88% Consistent with metabolic acidosis  Starting bicarb drip, levaphed Recheck LA, CMP, CBC, troponin Bolus 1 lt NS Stress dose steroids Continue broad abx coverage. Check C diff CT chest, abd, pelvis  Additional critical care time- 35 mins.  Marshell Garfinkel MD [facility] Pager [phone] If no answer or after 3pm call: [phone] 11/04/2017, 4:47 PM

## 2017-11-04 NOTE — Progress Notes (Signed)
RT note: RT called for a stat breathing treatment. Patient was in respiratory distress with rhonchi through out. RT called rapid response and place patient on BIPAP per RCP protocol.

## 2017-11-04 NOTE — Progress Notes (Signed)
Responded to Spiritual care consult. To support patient.  Patient is intubated and unable to communicate. Chaplain available as needed. Jaclynn Major, [place], [facility], Pager [phone]

## 2017-11-04 NOTE — Progress Notes (Signed)
Late entry. RN was called to patient's room by RT at approximately 1035 due to patient status. Patient's HR was in the 140s and RR was in the 40s-50s. Lung sounds coarse. RRT was called to the bedside and RT called to place patient on BiPAP.

## 2017-11-04 NOTE — Progress Notes (Signed)
Patient transported from 2M11 to CT and back. No complications noted.

## 2017-11-04 NOTE — Progress Notes (Signed)
RT note: RT transported patient from [facility] to 2M11. Vital signs stable at this time.

## 2017-11-04 NOTE — Progress Notes (Signed)
Patient's MAR is showing that it is time for CVC dressing to be changed. Per current dressing, it was changed on 10/31/17. Dressing not due to be changed per protocol until 11/07/17. Dressing is clean, dry, and intact.

## 2017-11-04 NOTE — Progress Notes (Signed)
7 cc of fentanyl PCA wasted in sink with Raliegh Ip, RN.

## 2017-11-04 NOTE — Progress Notes (Signed)
14 Days Post-Op   Subjective/Chief Complaint: Pt feel a little more tired today Still with some n/v with PO    Objective: Vital signs in last 24 hours: Temp:  [98.6 F (37 C)-100.7 F (38.2 C)] 100.1 F (37.8 C) (11/29 0403) Pulse Rate:  [110-136] 130 (11/29 0403) Resp:  [24-47] 28 (11/29 0525) BP: (127-163)/(66-81) 163/81 (11/29 0403) SpO2:  [66 %-100 %] 96 % (11/29 0525) Last BM Date: 11/02/17  Intake/Output from previous day: 11/28 0701 - 11/29 0700 In: 3058.3 [P.O.:120; I.V.:1203.3; IV Piggyback:1730] Out: 2706 [Urine:1400; Drains:10; Chest Tube:23] Intake/Output this shift: No intake/output data recorded.  General appearance: alert and cooperative GI: soft, non-tender; bowel sounds normal; no masses,  no organomegaly  Lab Results:  Recent Labs    11/03/17 0002 11/03/17 0500  WBC 29.3* 24.3*  HGB 9.5* 8.9*  HCT 29.4* 27.9*  PLT 345 295   BMET Recent Labs    11/03/17 0002 11/03/17 0500  NA 133* 137  K 4.4 3.3*  CL 109 113*  CO2 15* 18*  GLUCOSE 88 102*  BUN 17 19  CREATININE 0.84 0.83  CALCIUM 7.9* 8.0*   PT/INR No results for input(s): LABPROT, INR in the last 72 hours. ABG No results for input(s): PHART, HCO3 in the last 72 hours.  Invalid input(s): PCO2, PO2  Studies/Results: No results found.  Anti-infectives: Anti-infectives (From admission, onward)   Start     Dose/Rate Route Frequency Ordered Stop   11/02/17 1200  aztreonam (AZACTAM) injection 1 g  Status:  Discontinued     1 g Intravenous Every 8 hours 11/02/17 0923 11/02/17 0936   11/02/17 1200  aztreonam (AZACTAM) 1 g in dextrose 5 % 50 mL IVPB     1 g 100 mL/hr over 30 Minutes Intravenous Every 8 hours 11/02/17 0936     11/02/17 1000  metroNIDAZOLE (FLAGYL) IVPB 500 mg     500 mg 100 mL/hr over 60 Minutes Intravenous Every 8 hours 11/02/17 0923     10/31/17 1215  linezolid (ZYVOX) IVPB 600 mg     600 mg 300 mL/hr over 60 Minutes Intravenous Every 12 hours 10/31/17 1202     10/27/17 1400  meropenem (MERREM) 1 g in sodium chloride 0.9 % 100 mL IVPB  Status:  Discontinued     1 g 200 mL/hr over 30 Minutes Intravenous Every 8 hours 10/27/17 1245 11/02/17 0921   10/27/17 1100  levofloxacin (LEVAQUIN) IVPB 750 mg  Status:  Discontinued     750 mg 100 mL/hr over 90 Minutes Intravenous Every 24 hours 10/27/17 1041 10/27/17 1218   10/27/17 0400  piperacillin-tazobactam (ZOSYN) IVPB 3.375 g  Status:  Discontinued     3.375 g 12.5 mL/hr over 240 Minutes Intravenous Every 8 hours 10/26/17 2201 10/27/17 1222   10/22/17 0600  vancomycin (VANCOCIN) IVPB 1000 mg/200 mL premix  Status:  Discontinued     1,000 mg 200 mL/hr over 60 Minutes Intravenous To Short Stay 10/12/2017 1631 10/20/2017 1637   10/08/2017 2100  vancomycin (VANCOCIN) IVPB 1000 mg/200 mL premix     1,000 mg 200 mL/hr over 60 Minutes Intravenous Every 12 hours 10/12/2017 1631 10/29/2017 2246   10/20/2017 0900  vancomycin (VANCOCIN) IVPB 1000 mg/200 mL premix     1,000 mg 200 mL/hr over 60 Minutes Intravenous To Surgery 11/03/2017 0857 11/04/2017 0957   10/17/17 0800  vancomycin (VANCOCIN) IVPB 750 mg/150 ml premix  Status:  Discontinued     750 mg 150 mL/hr over 60 Minutes Intravenous  Every 24 hours 10/16/17 1220 10/17/17 1023   10/16/17 1800  anidulafungin (ERAXIS) 100 mg in sodium chloride 0.9 % 100 mL IVPB     100 mg 78 mL/hr over 100 Minutes Intravenous Every 24 hours 10/15/17 1657     10/16/17 0600  vancomycin (VANCOCIN) IVPB 1000 mg/200 mL premix  Status:  Discontinued     1,000 mg 200 mL/hr over 60 Minutes Intravenous Every 36 hours 10/12/2017 2308 10/16/17 1220   10/15/17 1800  anidulafungin (ERAXIS) 200 mg in sodium chloride 0.9 % 200 mL IVPB     200 mg 78 mL/hr over 200 Minutes Intravenous  Once 10/15/17 1657 10/15/17 2148   10/20/2017 2330  piperacillin-tazobactam (ZOSYN) IVPB 3.375 g  Status:  Discontinued     3.375 g 100 mL/hr over 30 Minutes Intravenous Every 8 hours 10/18/2017 2257 10/18/2017 2259   11/04/2017  2315  piperacillin-tazobactam (ZOSYN) IVPB 3.375 g  Status:  Discontinued     3.375 g 12.5 mL/hr over 240 Minutes Intravenous Every 8 hours 10/13/2017 2300 10/26/17 2201   11/03/2017 1930  vancomycin (VANCOCIN) IVPB 1000 mg/200 mL premix     1,000 mg 200 mL/hr over 60 Minutes Intravenous  Once 10/17/2017 1915 11/04/2017 2044   10/28/2017 1530  piperacillin-tazobactam (ZOSYN) IVPB 3.375 g     3.375 g 100 mL/hr over 30 Minutes Intravenous  Once 10/28/2017 1529 10/24/2017 1604      Assessment/Plan: s/p Procedure(s): LEFT VIDEO ASSISTED THORACOSCOPY (VATS) for MEDISTINAL DRAINAGE (Left) MUST mobilize today con't reglan/megace  LOS: 21 days    [name]., Anne Hahn 11/04/2017

## 2017-11-04 NOTE — Progress Notes (Signed)
Palliative:  Danielle [name] is currently on BiPAP and PCCM at bedside preparing for intubation. [name] clearly reiterated her desire for aggressive care and intubation if needed - this is consistent with our conversations as well. Palliative will shadow and follow up Sunday/Monday but please call [phone] for acute decline and palliative needs in the meantime.   No charge  Vinie Sill, NP Palliative Medicine Team Pager # [phone] (M-F 8a-5p) Team Phone # [phone] (Nights/Weekends)

## 2017-11-04 NOTE — Progress Notes (Signed)
eLink Physician-Brief Progress Note Patient Name: Danielle [name] DOB: [birth year] MRN: [id number]   Date of Service  11/04/2017  HPI/Events of Note  ARDS, septic shock, acidosis.  eICU Interventions  Change to ARDS protocol, increase range of levophed, add vasopressin, give fluid bolus, place a line, check CVP, start nimbex, f/u labs.         Allon Costlow 11/04/2017, 9:17 PM

## 2017-11-04 NOTE — Progress Notes (Signed)
[facility] NOTE   Pharmacy Consult for TPN Indication: Poor PO intake / unable to place repeat G-tube   Patient Measurements: Height: 5' 4.5" (163.8 cm) Weight: 123 lb 0.3 oz (55.8 kg) IBW/kg (Calculated) : 55.85 TPN AdjBW (KG): 59 Body mass index is 20.79 kg/m.  Assessment:  72 yoF re-admit 11/8 with N/V, loose stools (baseline w/Crohn's). Was hospitalized for a month in October and developed fungemia and treated with Invanz and anidulafungin. Re-admitted a week later with intermittent fevers and difficultly swallowing. CT chest showed recurrent mediastinal fluid abscess.  PMH: Crohns, HH, hx SBO  Significant events:  11/15 Left VATs to drain mediastinum  11/23 IR guided perihepatic abscess drain placement   GI: Mediastinal abscess complex, advanced to full liquid diet again but N/V with almost anything PO - continuing nausea, poor-fair appetite. Patient refuses feeding tube. Phenergan low dose d/t Qtc TID and monitoring for each dose. Avoiding Zofran. Prn Compazine. Abd abscess drain output low. Adding megace/reglan to regimen 11/26. PPI IV. Albumin low at 1.5, Pre-albumin low at 8. Last BM 11/27 Endo: CBGs remain on lower end on continuous TPN, 71-110 Sliding scale insulin stopped on 11/21. D5 d/c'd 11/26 Insulin requirements past 24 hours: 0 units Lytes: Cl up to 114, CO2 down to 14. K up to 4.1 (S/p K runs x 5 + increased K in TPN). CoCa trending high 10.5. Others WNL Renal: SCr 0.94 stable, BUN WNL. UOP 1 mL/kg/hr Neuro: Fent PCA, APAP and fent prn - pain score 6 Pulm: Bilateral chest tube output at 20 mL / 24 hours, chest tubes to remain in place for several days. R-effusion larger 11/23 - not significant fluid to remove Cards: SBP 130-160s, HR ST 120-130s. Lopressor prn Anticoag: RUE DVT - holding heparin due to hematemesis Hepatobil: AST mildly elevated, ALT and Tbili wnl. Alk phos high at 146. Trig [id number] ID: ID changed to Aztreonam  + Flagyl + Anidulafungin for mediastinal abscess due to concern for possible drug fever. 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 Rare budding yeast, GPC in pairs on Gstain of 11/23 abd abscess cx. Tmax/24h 100.7, WBC up to 24.3. Drain in place. Cdiff negative. 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 Repeat blood cultures sent 11/25  Best Practices: SCDs TPN Access: PICC 11/11>> TPN start date: 11/11>>  Nutritional Goals (per RD recommendation on 11/19): Kcal:  1500-1700 Protein:  75-90 grams  Fluid:  > 1.5 L  Current Nutrition:  Full liquid (appetite poor) Boost Breeze PO BID added 11/26 - provides 500 kcal + 18g protein total daily (all doses charted except 1 since initiation) Custom TPN   Plan:  Continue TPN rate of 50 mL/hr over 24 hr to account for addition of Boost Breeze, attempt to stimulate appetite - May need to increase again if unable to tolerate PO, hesitant to cycle TPN again at this time given continued low CBGs TPN provides 60 g of protein, 168 g of dextrose, and 36 g lipids providing 1171 total kcal, which meets ~100% of patient needs in combination with Boost Breeze supplements. Electrolytes in TPN: decrease Ca, continue max acetate AddMVI,trace elements in TPN Continue CBG checks for hypoglycemic events Monitor TPN labs, clinical progress, Qtc F/U ability to tolerate PO diet, GOC   Elicia Lamp, PharmD, BCPS Clinical Pharmacist Rx Phone # for today: [phone] After 3:30PM, please call Main Rx: [phone] 11/04/2017 7:42 AM

## 2017-11-04 NOTE — Progress Notes (Signed)
MD pelase call the patients POA  And sister Louretta Parma [phone] during rounds 11-11-17

## 2017-11-04 NOTE — Progress Notes (Signed)
RT note: RT gave abg panic value to patient RN Timmothy Sours at [phone]. RN in process of calling CCM.

## 2017-11-04 NOTE — Progress Notes (Addendum)
PULMONARY / CRITICAL CARE MEDICINE PM Re-ROUND   Name: Danielle [name] MRN: [id number] DOB: August 31, 1946    ADMISSION DATE:  10/27/2017 CONSULTATION DATE:    REFERRING MD:  [email or web address]   CHIEF COMPLAINT:  Acute respiratory failure   HISTORY OF PRESENT ILLNESS:   This is a 71 year old female with a very complicated medical history who is readmitted to [facility] on 11/8 with chief complaint of nausea and vomiting.  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  She was treated with parenteral antifungals.  She eventually was able to ambulate improved to the point where she was discharged she was to be discharged on IV Invanz as well his Eraxis for 2 more weeks via PICC line. She was again readmitted on 11 8 with chief complaint of nausea and vomiting CT of abdomen and pelvis was obtained and this showed reaccumulation of fluid collection in the lower mediastinum as well as bibasilar pleural effusions and another fluid collection along the posterior margin of the spleen.  She ultimately underwent video-assisted thoracotomy with chest tube placement on 11/15 as well as new percutaneous drainage placed in the anterior abdominal abscess on the 11/23.  She has been getting IV nutrition via T and A.  Infectious disease has been following and helping with antibiotics.  On 11/28 she was spiking fever as high as 104.6.  Because of this she had been on [employer], anidulafungin, and meropenem.  We switched to meropenem to aztreonam and Flagyl on 11/27 with  concern for drug fever.  There is also control about concern about adequate source control still.  She apparently had a vomiting episode the evening of 11/28 had been getting progressively tachypneic and developed worsening respiratory failure.   Critical care was asked to see her on 11/29 when she developed worsening respiratory failure requiring 100%  FiO2 on noninvasive ventilation and still had marked paroxysmal breathing with respiratory rate in the 40s.  A stat chest x-ray demonstrated a new infiltrate involving the right upper middle lobe.  SUBJECTIVE:  Unable as patient is intubated and encephalopathic  VITAL SIGNS: BP (!) 89/59   Pulse (!) 168   Temp 98 F (36.7 C) (Oral)   Resp (!) 32   Ht 5\' 6"  (1.676 m)   Wt 55.8 kg (123 lb 0.3 oz)   SpO2 (!) 81%   BMI 19.86 kg/m   HEMODYNAMICS:    VENTILATOR SETTINGS: Vent Mode: PRVC FiO2 (%):  [45 %-100 %] 100 % Set Rate:  [16 bmp-26 bmp] 26 bmp Vt Set:  [450 mL-480 mL] 480 mL PEEP:  [8 cmH20] 8 cmH20 Plateau Pressure:  [27 cmH20-46 cmH20] 36 cmH20  INTAKE / OUTPUT: I/O last 3 completed shifts: In: 6289.4 [P.O.:120; I.V.:3074.4; Other:1005; IV IRCVELFYB:0175] Out: 1025 [ENIDP:8242; Drains:10; Stool:1400; Chest Tube:23]  PHYSICAL EXAMINATION: General: frail elderly female on vent HEENT: ETT in place, [place]/AT, PERRL pulmonary: Coarse rhonchi throughout. Vent assisted breaths. Rate 35.  Cardiac: Tachycardic, no murmur rub or gallop. Abdomen: Soft, non-distended, hypoactive extremity/muscular skeletal: Cool extremities Neuro/musculoskeletal: sedated, RASS -5 LABS:  BMET Recent Labs  Lab 11/03/17 0002 11/03/17 0500 11/04/17 0500  NA 133* 137 137  K 4.4 3.3* 4.1  CL 109 113* 114*  CO2 15* 18* 14*  BUN 17 19 16   CREATININE 0.84 0.83  0.94  GLUCOSE 88 102* 98    Electrolytes Recent Labs  Lab 11/02/17 0417 11/03/17 0002 11/03/17 0500 11/04/17 0500  CALCIUM 8.2* 7.9* 8.0* 8.4*  MG 2.0  --  1.7 1.7  PHOS 3.6  --  3.9 4.3    CBC Recent Labs  Lab 11/03/17 0002 11/03/17 0500 11/04/17 0500  WBC 29.3* 24.3* 47.5*  HGB 9.5* 8.9* 8.8*  HCT 29.4* 27.9* 27.1*  PLT 345 295 309    Coag's Recent Labs  Lab 10/29/17 1115  INR 1.24    Sepsis  Markers Recent Labs  Lab 10/31/17 0145 11/03/17 0002 11/03/17 0500 11/04/17 1532  LATICACIDVEN 1.1 2.2* 1.7  --   PROCALCITON  --   --   --  6.72    ABG Recent Labs  Lab 11/04/17 1601 11/04/17 2130 11/04/17 2237  PHART 7.143* 7.003* 6.965*  PCO2ART 31.3* 36.4 33.9  PO2ART 71.0* 62.2* 70.0*    Liver Enzymes Recent Labs  Lab 11/01/17 0230 11/04/17 0500  AST 52* 45*  ALT 37 30  ALKPHOS 149* 146*  BILITOT 0.5 0.5  ALBUMIN 1.5* 1.4*    Cardiac Enzymes No results for input(s): TROPONINI, PROBNP in the last 168 hours.  Glucose Recent Labs  Lab 11/02/17 0743 11/02/17 1208 11/03/17 2140 11/04/17 0748 11/04/17 1604 11/04/17 2015  GLUCAP 99 110* 71 87 74 79    Imaging Ct Chest W Contrast  Result Date: 11/04/2017 CLINICAL DATA:  Complicated pneumonia, history of abscess EXAM: CT CHEST, ABDOMEN, AND PELVIS WITH CONTRAST TECHNIQUE: Multidetector CT imaging of the chest, abdomen and pelvis was performed following the standard protocol during bolus administration of intravenous contrast. CONTRAST:  151mL ISOVUE-300 IOPAMIDOL (ISOVUE-300) INJECTION 61% COMPARISON:  10/29/2017, 10/28/2017, 10/25/2017, 11/04/2017, 10/01/2017, 09/24/2017, multiple prior studies dating back to 07/27/2017 FINDINGS: CT CHEST FINDINGS Cardiovascular: Left-sided central venous catheters are present. The left subclavian central venous catheter tip appears to be positioned at the SVC origin. The left arm PICC catheter tip appears to be positioned within the azygos vein, series 3, image number 22. Mild atherosclerotic calcification. No aneurysmal dilatation. Heart size is within normal limits. No large pericardial effusion. Mediastinum/Nodes: Re- demonstrated heterogenous thyroid with multiple hypodense nodules in the right lobe. Endotracheal tube tip terminates approximately 1.5 cm above the carina. Esophageal tube tip terminates in the mid stomach. Heterogenous thickening of hiatal hernia without re-  accumulation of fluid collection. A left-sided chest tube tip is again positions along the inferior left mediastinum near the hernia. Mild mediastinal lymph nodes, measuring up to 9 mm at the AP window. Lungs/Pleura: Small slightly loculated bilateral pleural effusions. Significant worsening of extensive dense consolidation involving the upper lobes, right middle lobe and bilateral lower lobes. A linear density is re- demonstrated at the left lung base and may relate to operative change. No pneumothorax. Musculoskeletal: No acute or suspicious bone lesion. Probable false appearance of sternal fracture due to motion artifact. CT ABDOMEN PELVIS FINDINGS Hepatobiliary: Surgical clips at the gallbladder fossa. New percutaneous drain at the falciform ligament with decreased loculated fluid collection. No focal hepatic abnormality Pancreas: Unremarkable. No pancreatic ductal dilatation or surrounding inflammatory changes. Spleen: Normal in size without focal abnormality. Re- demonstrated complex slightly rim enhancing fluid collection posterior to the spleen measuring 4 x 11 mm, compared with 3.6 x 14 mm previously. Adrenals/Urinary Tract: Adrenal glands are within normal limits. Kidneys show no hydronephrosis. The bladder has a Foley catheter Stomach/Bowel: Esophageal tube in the stomach. Thickened appearance of distal small bowel with evidence of  prior bowel resection. Mild wall thickening diffusely of the residual colon. Rectal tube is in place. Re- demonstrated left perianal fistula. Vascular/Lymphatic: Nonaneurysmal aorta.  No significant adenopathy Reproductive: Uterus and bilateral adnexa are unremarkable. Other: Negative for free air. Rim enhancing pelvic fluid collection measures 11 x 56 mm and is grossly stable allowing for differences in measurement technique. It is contiguous with a mild rim enhancing linear fluid tract with a 14 mm small fluid collection in the right gluteal region, likely related to the  prior drainage catheter placement. Musculoskeletal: Stable mild compression L1. Re- demonstrated sacral Tarlov cysts. IMPRESSION: 1. Small bilateral pleural effusions with extensive dense consolidations involving the bilateral lungs, significantly worsened compared to prior chest CT. 2. Left PICC tip is positioned within the azygos vein 3. Heterogenous mediastinal density adjacent to the hiatal hernia but without reaccumulation of fluid. Stable positioning of a chest tube adjacent to the left aspect of the hernia. 4. Interval placement of percutaneous drainage catheter along the inferior aspect of the left hepatic lobe with resolution of fluid collection. 5. Stable rim enhancing fluid collection adjacent to the spleen and within the pelvis 6. Post- operative changes of the small and large bowel. Wall thickening of residual colon presumably related to history of inflammatory bowel disease. Electronically Signed   By: Donavan Foil M.D.   On: 11/04/2017 21:18   Ct Abdomen Pelvis W Contrast  Result Date: 11/04/2017 CLINICAL DATA:  Complicated pneumonia, history of abscess EXAM: CT CHEST, ABDOMEN, AND PELVIS WITH CONTRAST TECHNIQUE: Multidetector CT imaging of the chest, abdomen and pelvis was performed following the standard protocol during bolus administration of intravenous contrast. CONTRAST:  171mL ISOVUE-300 IOPAMIDOL (ISOVUE-300) INJECTION 61% COMPARISON:  10/29/2017, 10/28/2017, 10/25/2017, 10/12/2017, 10/01/2017, 09/24/2017, multiple prior studies dating back to 07/27/2017 FINDINGS: CT CHEST FINDINGS Cardiovascular: Left-sided central venous catheters are present. The left subclavian central venous catheter tip appears to be positioned at the SVC origin. The left arm PICC catheter tip appears to be positioned within the azygos vein, series 3, image number 22. Mild atherosclerotic calcification. No aneurysmal dilatation. Heart size is within normal limits. No large pericardial effusion. Mediastinum/Nodes:  Re- demonstrated heterogenous thyroid with multiple hypodense nodules in the right lobe. Endotracheal tube tip terminates approximately 1.5 cm above the carina. Esophageal tube tip terminates in the mid stomach. Heterogenous thickening of hiatal hernia without re- accumulation of fluid collection. A left-sided chest tube tip is again positions along the inferior left mediastinum near the hernia. Mild mediastinal lymph nodes, measuring up to 9 mm at the AP window. Lungs/Pleura: Small slightly loculated bilateral pleural effusions. Significant worsening of extensive dense consolidation involving the upper lobes, right middle lobe and bilateral lower lobes. A linear density is re- demonstrated at the left lung base and may relate to operative change. No pneumothorax. Musculoskeletal: No acute or suspicious bone lesion. Probable false appearance of sternal fracture due to motion artifact. CT ABDOMEN PELVIS FINDINGS Hepatobiliary: Surgical clips at the gallbladder fossa. New percutaneous drain at the falciform ligament with decreased loculated fluid collection. No focal hepatic abnormality Pancreas: Unremarkable. No pancreatic ductal dilatation or surrounding inflammatory changes. Spleen: Normal in size without focal abnormality. Re- demonstrated complex slightly rim enhancing fluid collection posterior to the spleen measuring 4 x 11 mm, compared with 3.6 x 14 mm previously. Adrenals/Urinary Tract: Adrenal glands are within normal limits. Kidneys show no hydronephrosis. The bladder has a Foley catheter Stomach/Bowel: Esophageal tube in the stomach. Thickened appearance of distal small bowel with evidence of prior  bowel resection. Mild wall thickening diffusely of the residual colon. Rectal tube is in place. Re- demonstrated left perianal fistula. Vascular/Lymphatic: Nonaneurysmal aorta.  No significant adenopathy Reproductive: Uterus and bilateral adnexa are unremarkable. Other: Negative for free air. Rim enhancing  pelvic fluid collection measures 11 x 56 mm and is grossly stable allowing for differences in measurement technique. It is contiguous with a mild rim enhancing linear fluid tract with a 14 mm small fluid collection in the right gluteal region, likely related to the prior drainage catheter placement. Musculoskeletal: Stable mild compression L1. Re- demonstrated sacral Tarlov cysts. IMPRESSION: 1. Small bilateral pleural effusions with extensive dense consolidations involving the bilateral lungs, significantly worsened compared to prior chest CT. 2. Left PICC tip is positioned within the azygos vein 3. Heterogenous mediastinal density adjacent to the hiatal hernia but without reaccumulation of fluid. Stable positioning of a chest tube adjacent to the left aspect of the hernia. 4. Interval placement of percutaneous drainage catheter along the inferior aspect of the left hepatic lobe with resolution of fluid collection. 5. Stable rim enhancing fluid collection adjacent to the spleen and within the pelvis 6. Post- operative changes of the small and large bowel. Wall thickening of residual colon presumably related to history of inflammatory bowel disease. Electronically Signed   By: Donavan Foil M.D.   On: 11/04/2017 21:18   Dg Chest Port 1 View  Result Date: 11/04/2017 CLINICAL DATA:  Repositioning of OGT EXAM: PORTABLE CHEST 1 VIEW COMPARISON:  Portable exam 1207 hours compared 1131 hours FINDINGS: Tip of endotracheal tube projects 1.8 cm above carina. Nasogastric tube now extends into stomach. LEFT thoracostomy tube again seen. LEFT arm PICC line tip projects deflects into azygos vein. LEFT jugular line tip projects over SVC at azygos confluence. Pigtail drainage catheter RIGHT upper quadrant. Normal heart size mediastinal contours. BILATERAL perihilar infiltrates greater on RIGHT. Dense atelectasis versus consolidation LEFT lower lobe. No definite pleural effusion or pneumothorax. Bones appear to be diffusely  demineralized. IMPRESSION: Nasogastric tube now extends into stomach. Remaining line and tube positions as above. Persistent BILATERAL perihilar infiltrates greater on RIGHT with persistent atelectasis versus consolidation in LEFT lower lobe. Electronically Signed   By: Lavonia Dana M.D.   On: 11/04/2017 12:45   Dg Chest Port 1 View  Result Date: 11/04/2017 CLINICAL DATA:  71 year old female new intubation. OG tube partially advanced at the time of this image. EXAM: PORTABLE CHEST 1 VIEW COMPARISON:  1045 hours today and earlier. FINDINGS: Portable AP semi upright view at 1131 hours. Endotracheal tube tip in good position between the level the clavicles and carina. Stable left subclavian region approach and superimposed left upper extremity vascular catheters. An enteric tube has been placed, the tip projects about 1 vertebral body below the carina at this time. Below that a mediastinal drain is stable. Larger lung volumes. Stable cardiac size and mediastinal contours. Dense retrocardiac and left lung base opacity persists. No pneumothorax. Patchy and confluent right perihilar opacity persists. Paucity of bowel gas in the upper abdomen. IMPRESSION: 1. Endotracheal tube tip in good position. 2. Enteric tube tip at the midthoracic esophagus level. The clinical team knows to advance this tube and is going to obtain a follow-up image. 3.  Otherwise stable lines and tubes. 4. Stable ventilation. Confluent right perihilar opacity persists, suspicious for aspiration versus pneumonia. Electronically Signed   By: Genevie Ann M.D.   On: 11/04/2017 11:59   Dg Chest Port 1 View  Result Date: 11/04/2017 CLINICAL DATA:  Shortness of breath.  Abnormal breath sounds. EXAM: PORTABLE CHEST 1 VIEW COMPARISON:  10/28/2017. FINDINGS: Left subclavian lines are in stable position with 1 line again projecting into the SVC in the other into the azygos vein. Mediastinal drainage catheter in stable position. Stable cardiomegaly. Increase  infiltrate/ edema right lung. Mild improvement of left upper lobe infiltrate/edema. Small bilateral pleural effusions. IMPRESSION: 1. Lines and tubes in stable position. 2. Increase infiltrates/edema right lung. Mild improvement of left upper lobe infiltrate/edema. Small bilateral pleural effusions. 3. Stable cardiomegaly . Electronically Signed   By: Marcello Moores  Register   On: 11/04/2017 11:07     STUDIES:  11/22 US renal: Limited study as patient refused to allow imaging of the left flank. Right kidney appears unremarkable. Urinary bladder decompressed with Foley catheter and cannot be assessed. 11/22 CT abd/pelvis: Near complete resolution of inferior mediastinal fluid collection/abscess surrounding the distal esophagus and small hiatal hernia and decreased small left pleural effusion, following chest tube placement. Increased moderate right pleural effusion and right basilar atelectasis. Mild increase in wall thickening and adjacent inflammatory changes involving the distal ileum, with mild dilatation of several small bowel loops proximal to this site. This is consistent with Crohn's disease and stricture formation. Stable small rim enhancing fluid collections along posterosuperior margin of the spleen and pelvic cul-de-sac, suspicious for Abscesses. Left-sided transsphincteric perianal fistula.  CULTURES: 11/12 gi panel: neg 11/8 BCX2: neg Pleural fluid 11.12: neg Blood culture 11/12: neg UC 11/14: neg  UC 11/14: neg Pleural afb 11/15: neg Left pleural 1/15: neg bc x2 11/13: neg Fungal stain 11/15: >>> BCX2 11/18: neg UC 11/21: neg UC 11/21: neg Cdiff 11/23: neg bcx2 11/21: neg Surgical abd abscess 11/23: rare budding yeast. Rare gpc  bc x 2 11/25>>  ANTIBIOTICS: Anidulafungin 11/9>>> Zosyn 11/9>>>11/21  vanc 11/9>>>11/25  zyvox 11/25>>> Meropenem 11/21>>> Flagyl 11/27>> azactam 11/27>>  SIGNIFICANT EVENTS: Admitted 11/8 Video-assisted thoracotomy  11/15  LINES/TUBES: oett 11/29>>> Left chest tube 11/15>>> Perc abd drain 11/23  DISCUSSION: Admitted w/ post-op abscess. Now w/ new fever and aspiration on top of on-going sepsis. Intubated 11/29 for aspiration event. S/p VATS 11/15 and perc drain abd to abd abscess 11/23. Will scan chest abd and pelvis to r/o new possible source other than aspiration given fever and clinical decline onset was before the acute aspiration event. Defer abx to ID. Surgery following.   ASSESSMENT / PLAN:  PULMONARY A: ARDS Acute hypoxic respiratory failure in setting of acute aspiration event/HCAP S/p recent VATS for mediastinal abscess w/ Left CT placement 11/15 P:   Full vent support -PRVC 8cc/Kg IBW  Unable to deescalate due to acidosis. 100% 8 PEEP, shock limits escalation, rate 35 due to refractory acidosis.  VAP protocol Neuromuscualr blockade F/u abg and cxr See ID section  Chest tube per thoracic surgery   CARDIOVASCULAR A:  Septic shock  P:  Cont tele  Caution with fluid challenge given ARDS Failed levo/vaso, added Giapreza Flowtrac in place, no additional access for fluid challenge stroke volume assessment.   RENAL A:   Refractory acidosis Hyperchloremia  P:   Renal dose meds  Bicarb infusion 169mL/hr Cont I&O monitoring  Follow renal function  GASTROINTESTINAL A:   Post-op ileus Nausea and vomiting  Severe protein calorie malnutrition  P:   NPO NGT to LIWS Cont NPO and TPN per pharmacy  Hold TPN as access is needed for pressors/sedation/bicarb  HEMATOLOGIC A:   Anemia of critical illness w/out evidence of bleeding  P:  Trend cbc  Transfuse per protocol   INFECTIOUS A:   Fungemia  Recurrent post -op abd abscess s/p perc drain 11/23 Mediastinal abscess s/p VATS w/ left CT 11/15 New fever w/ acute aspiration event 11/29 On-going sepsis  P:   Cont abx per ID service CT chest/abd/pelvis once stable. Looking for new potential infectious source   ENDOCRINE A:    No acute  P:   Trend CBG  NEUROLOGIC A:   Acute metabolic encephalopathy r/t acidosis, shock, sepsis P:   RASS goal: -5 TO4 and BIS Neuromuscular blockade per protocol Versed and fentanyl infusions  FAMILY  - Updates: updated sister via phone, informed of poor prognosis. Will come in tonight  - Inter-disciplinary family meet or Palliative Care meeting due by:  Dec 5  Additional critical care time excluding procedures 45 mins  Georgann Housekeeper, AGACNP-BC [facility] Pulmonology/Critical Care Pager [phone] or [phone]  11/04/2017 11:14 PM

## 2017-11-04 NOTE — Procedures (Signed)
Intubation Procedure Note Danielle [name] [id number] 06-26-1946  Procedure: Intubation Indications: Airway protection and maintenance  Procedure Details Consent: Risks of procedure as well as the alternatives and risks of each were explained to the (patient/caregiver).  Consent for procedure obtained. Time Out: Verified patient identification, verified procedure, site/side was marked, verified correct patient position, special equipment/implants available, medications/allergies/relevent history reviewed, required imaging and test results available.  Performed  Maximum sterile technique was used including gloves, gown and mask.  Glidescope 3. Intubated with 7.5 tube without difficulty Pt has emesis during the intubation procedure.   Evaluation Hemodynamic Status: BP stable throughout; O2 sats: stable throughout Patient's Current Condition: stable Complications: No apparent complications Patient did tolerate procedure well. Chest X-ray ordered to verify placement.  CXR: pending.  Danielle [name] 11/04/2017

## 2017-11-04 NOTE — Progress Notes (Signed)
Referring Physician(s): Dr. Donne Hazel  Supervising Physician: Dr. Laurence Ferrari  Patient Status:  [facility] - In-pt  Chief Complaint: Abdominal abscess  Subjective: Shivering, feverish at time of visit.   Allergies: Zithromax [azithromycin]; Aspirin; Ibuprofen; Iron; and Sulfa antibiotics  Medications:  Current Facility-Administered Medications:  .  acetaminophen (OFIRMEV) IV 1,000 mg, 1,000 mg, Intravenous, N6E PRN, Pershing Proud, NP, Stopped at 11/04/17 0341 .  albuterol (PROVENTIL) (2.5 MG/3ML) 0.083% nebulizer solution 2.5 mg, 2.5 mg, Nebulization, Q6H PRN, Carlyle Basques, MD, 2.5 mg at 11/04/17 1031 .  anidulafungin (ERAXIS) 100 mg in sodium chloride 0.9 % 100 mL IVPB, 100 mg, Intravenous, Q24H, Michel Bickers, MD, Stopped at 11/03/17 2045 .  aztreonam (AZACTAM) 1 g in dextrose 5 % 50 mL IVPB, 1 g, Intravenous, Q8H, Regalado, Belkys A, MD, Last Rate: 100 mL/hr at 11/04/17 0523, 1 g at 11/04/17 0523 .  chlorhexidine (PERIDEX) 0.12 % solution 15 mL, 15 mL, Mouth Rinse, BID, Regalado, Belkys A, MD, 15 mL at 11/03/17 2341 .  diphenhydrAMINE (BENADRYL) injection 12.5 mg, 12.5 mg, Intravenous, Q6H PRN **OR** diphenhydrAMINE (BENADRYL) 12.5 MG/5ML elixir 12.5 mg, 12.5 mg, Oral, Q6H PRN, Gold, Wayne E, PA-C .  feeding supplement (BOOST / RESOURCE BREEZE) liquid 1 Container, 1 Container, Oral, BID BM, Regalado, Belkys A, MD, 1 Container at 11/03/17 1848 .  fentaNYL (SUBLIMAZE) injection 12.5 mcg, 12.5 mcg, Intravenous, Q2H PRN, Elgie Collard, PA-C, 12.5 mcg at 11/03/17 2340 .  fentaNYL 40 mcg/mL PCA injection, , Intravenous, Q4H, Gold, Wayne E, PA-C .  hydrocortisone (ANUSOL-HC) 2.5 % rectal cream 1 application, 1 application, Topical, QID PRN, Michael Boston, MD .  lip balm (BLISTEX) ointment, , Topical, PRN, Regalado, Belkys A, MD .  liver oil-zinc oxide (DESITIN) 40 % ointment, , Topical, PRN, Pershing Proud, NP, 1 application at 95/28/41 [phone]  MEDLINE mouth rinse, 15 mL, Mouth  Rinse, q12n4p, Regalado, Belkys A, MD, 15 mL at 11/03/17 1600 .  megestrol (MEGACE) 400 MG/10ML suspension 400 mg, 400 mg, Oral, TID, Ralene Ok, MD, 400 mg at 11/03/17 1751 .  metoCLOPramide (REGLAN) injection 5 mg, 5 mg, Intravenous, Q6H, Ralene Ok, MD, 5 mg at 11/04/17 0523 .  metoprolol tartrate (LOPRESSOR) injection 5 mg, 5 mg, Intravenous, Q6H PRN, Bodenheimer, Charles A, NP, 5 mg at 11/02/17 2050 .  metroNIDAZOLE (FLAGYL) IVPB 500 mg, 500 mg, Intravenous, Q8H, Carlyle Basques, MD, Stopped at 11/04/17 0427 .  naloxone [facility]) injection 0.4 mg, 0.4 mg, Intravenous, PRN **AND** sodium chloride flush (NS) 0.9 % injection 9 mL, 9 mL, Intravenous, PRN, Gold, Wayne E, PA-C .  naphazoline-glycerin (CLEAR EYES) ophth solution 1-2 drop, 1-2 drop, Both Eyes, QID PRN, Regalado, Belkys A, MD, 1 drop at 11/01/17 0820 .  nystatin (MYCOSTATIN/NYSTOP) topical powder 1 g, 1 g, Topical, BID PRN, Reubin Milan, MD .  oxyCODONE (ROXICODONE) 5 MG/5ML solution 5-10 mg, 5-10 mg, Oral, Q4H PRN, Bodenheimer, Charles A, NP, 10 mg at 10/26/17 2138 .  pantoprazole (PROTONIX) injection 40 mg, 40 mg, Intravenous, Q12H, Georgette Shell, MD, 40 mg at 11/03/17 2341 .  phenol (CHLORASEPTIC) mouth spray 1-2 spray, 1-2 spray, Mouth/Throat, PRN, Michael Boston, MD .  potassium chloride 10 mEq in 50 mL *CENTRAL LINE* IVPB, 10 mEq, Intravenous, Daily PRN, Jadene Pierini E, PA-C, Stopped at 11/02/17 1019 .  prochlorperazine (COMPAZINE) injection 5 mg, 5 mg, Intravenous, W1U PRN, Pershing Proud, NP, 5 mg at 11/04/17 0545 .  promethazine (PHENERGAN) injection 6.25 mg, 6.25 mg, Intravenous, TID  Clyde Lundborg, NP, 0.73 mg at 11/03/17 1749 .  promethazine (PHENERGAN) injection 6.25 mg, 6.25 mg, Intravenous, X1G PRN, Vinie Sill C, NP, 6.26 mg at 11/04/17 0326 .  sodium chloride flush (NS) 0.9 % injection 10-40 mL, 10-40 mL, Intracatheter, PRN, Ronnette Juniper, MD .  sodium chloride flush (NS) 0.9 % injection 5  mL, 5 mL, Intravenous, Q8H, Henn, Adam, MD, 5 mL at 11/03/17 0200 .  TPN ADULT (ION), , Intravenous, Continuous TPN, Romona Curls, [facility], Last Rate: 50 mL/hr at 11/03/17 1706 .  TPN ADULT (ION), , Intravenous, Continuous TPN, Romona Curls, RPH .  traMADol Veatrice Bourbon) tablet 50-100 mg, 50-100 mg, Oral, Q6H PRN, Gold, Wayne E, PA-C, 100 mg at 10/22/17 1248 .  vancomycin (VANCOCIN) 500 mg in sodium chloride 0.9 % 100 mL IVPB, 500 mg, Intravenous, Q12H, Carlyle Basques, MD    Vital Signs: BP 138/74 (BP Location: Right Leg)   Pulse (!) 125   Temp 99.3 F (37.4 C) (Rectal)   Resp (!) 38   Ht 5' 4.5" (1.638 m)   Wt 123 lb 0.3 oz (55.8 kg)   SpO2 92%   BMI 20.79 kg/m   Physical Exam  Drain intact, site clean, NT. Scant bloody output in collection bulb  Imaging: No results found.  Labs:  CBC: Recent Labs    11/02/17 0417 11/03/17 0002 11/03/17 0500 11/04/17 0500  WBC 19.5* 29.3* 24.3* 47.5*  HGB 8.7* 9.5* 8.9* 8.8*  HCT 27.4* 29.4* 27.9* 27.1*  PLT 329 345 295 309    COAGS: Recent Labs    05/07/17 0244 09/17/17 1021 10/16/2017 1405 10/15/17 0400 10/29/17 1115  INR  --  1.24 1.69 1.52 1.24  APTT 21*  --   --   --   --     BMP: Recent Labs    11/02/17 0417 11/03/17 0002 11/03/17 0500 11/04/17 0500  NA 133* 133* 137 137  K 3.2* 4.4 3.3* 4.1  CL 107 109 113* 114*  CO2 20* 15* 18* 14*  GLUCOSE 101* 88 102* 98  BUN 20 17 19 16   CALCIUM 8.2* 7.9* 8.0* 8.4*  CREATININE 0.77 0.84 0.83 0.94  GFRNONAA >60 >60 >60 >60  GFRAA >60 >60 >60 >60    LIVER FUNCTION TESTS: Recent Labs    10/25/17 0005 10/28/17 0500 11/01/17 0230 11/04/17 0500  BILITOT 0.3 0.3 0.5 0.5  AST 32 21 52* 45*  ALT 28 16 37 30  ALKPHOS 145* 120 149* 146*  PROT 6.0* 6.0* 7.2 6.6  ALBUMIN 1.5* 1.3* 1.5* 1.4*    Assessment and Plan: Abdominal abscesses, s/p hepatic abscess drain placement 11/23 WBC sharply back up to 47k.  Output scant blood tinged. IR to follow.   Electronically  Signed: Ascencion Dike, PA-C 11/04/2017, 10:39 AM   I spent a total of 15 Minutes at the the patient's bedside AND on the patient's hospital floor or unit, greater than 50% of which was counseling/coordinating care for intra-abdominal abscess.

## 2017-11-04 NOTE — Progress Notes (Signed)
[facility] PCCM PROGRESS NOTE  S:Called to bedside to evaluate patient with refractory shock and hypoxemia related to septic shock HCAP ARDS. Recently started on vaso with little benefit. Vent settings 100% 8 PEEP rate increased to 35 after last gas with pH 6.9 already on bicarb gtt.   O: BP (!) 83/53   Pulse (!) 123   Temp 98 F (36.7 C) (Oral)   Resp (!) 28   Ht 5\' 6"  (1.676 m)   Wt 55.8 kg (123 lb 0.3 oz)   SpO2 (!) 88%   BMI 19.86 kg/m    Plan - Start giapreza  - Will place art line.  - Attach hemodynamic monitoring device to assess if more volume can be given.  - Repeat ABG 30 mins after changes.  - poor prognosis  Georgann Housekeeper, ACNP [facility] Pulmonology/Critical Care Pager [phone] or [phone]

## 2017-11-04 NOTE — Progress Notes (Addendum)
[address]       [place] [address]             [phone]      14 Days Post-Op Procedure(s) (LRB): LEFT VIDEO ASSISTED THORACOSCOPY (VATS) for MEDISTINAL DRAINAGE (Left) Subjective: Remains very weak but looks better today, appears to be defervescing with low grade temp, Tmax 100.7.  Objective: Vital signs in last 24 hours: Temp:  [98.6 F (37 C)-100.7 F (38.2 C)] 99.3 F (37.4 C) (11/29 0746) Pulse Rate:  [110-136] 125 (11/29 0746) Cardiac Rhythm: Sinus tachycardia (11/28 2005) Resp:  [24-47] 41 (11/29 0746) BP: (127-163)/(66-81) 138/74 (11/29 0746) SpO2:  [66 %-100 %] 95 % (11/29 0746)  Hemodynamic parameters for last 24 hours:    Intake/Output from previous day: 11/28 0701 - 11/29 0700 In: 3058.3 [P.O.:120; I.V.:1203.3; IV Piggyback:1730] Out: 8338 [Urine:1400; Drains:10; Chest Tube:23] Intake/Output this shift: No intake/output data recorded.  General appearance: alert, cooperative and no distress Heart: regular rate and rhythm and tachy Lungs: scattered crackles Abdomen: soft, nontender or distended Extremities: no edema Wound: incis ok  Lab Results: Recent Labs    11/03/17 0002 11/03/17 0500  WBC 29.3* 24.3*  HGB 9.5* 8.9*  HCT 29.4* 27.9*  PLT 345 295   BMET:  Recent Labs    11/03/17 0500 11/04/17 0500  NA 137 137  K 3.3* 4.1  CL 113* 114*  CO2 18* 14*  GLUCOSE 102* 98  BUN 19 16  CREATININE 0.83 0.94  CALCIUM 8.0* 8.4*    PT/INR: No results for input(s): LABPROT, INR in the last 72 hours. ABG    Component Value Date/Time   PHART 7.317 (L) 10/22/2017 0420   HCO3 17.9 (L) 10/22/2017 0420   TCO2 15 (L) 09/10/2017 1214   ACIDBASEDEF 7.1 (H) 10/22/2017 0420   O2SAT 96.1 10/22/2017 0420   CBG (last 3)  Recent Labs    11/02/17 1208 11/03/17 2140 11/04/17 0748  GLUCAP 110* 71 87    Meds Scheduled Meds: . chlorhexidine  15 mL Mouth Rinse BID  . feeding supplement  1 Container Oral BID BM  . fentaNYL    Intravenous Q4H  . mouth rinse  15 mL Mouth Rinse q12n4p  . megestrol  400 mg Oral TID  . metoCLOPramide (REGLAN) injection  5 mg Intravenous Q6H  . pantoprazole  40 mg Intravenous Q12H  . promethazine  6.25 mg Intravenous TID AC  . sodium chloride flush  5 mL Intravenous Q8H   Continuous Infusions: . acetaminophen Stopped (11/04/17 0341)  . anidulafungin Stopped (11/03/17 2045)  . aztreonam 1 g (11/04/17 0523)  . linezolid (ZYVOX) IV Stopped (11/04/17 0102)  . metronidazole Stopped (11/04/17 0427)  . potassium chloride Stopped (11/02/17 1019)  . TPN ADULT (ION) 50 mL/hr at 11/03/17 1706   PRN Meds:.acetaminophen, diphenhydrAMINE **OR** diphenhydrAMINE, fentaNYL (SUBLIMAZE) injection, hydrocortisone, lip balm, liver oil-zinc oxide, metoprolol tartrate, naloxone **AND** sodium chloride flush, naphazoline-glycerin, nystatin, oxyCODONE, phenol, potassium chloride, prochlorperazine, promethazine, sodium chloride flush, traMADol  Xrays No results found.  Assessment/Plan: S/P Procedure(s) (LRB): LEFT VIDEO ASSISTED THORACOSCOPY (VATS) for MEDISTINAL DRAINAGE (Left)   1 improving fever curve  2 Poss d/c tube- no recent drainage 3 not tolerant of po nutrition 4 management per primary and other consultants  LOS: 21 days    Danielle [name] 11/04/2017 Patient seen and examined Developed respiratory distress this morning and was intubated Currently intubated and sedated Coarse rhonchi bilaterally R>L Probable aspiration pneumonia  Danielle Lipps C. Roxan Hockey,  MD [facility] [phone]

## 2017-11-04 NOTE — Progress Notes (Signed)
Pharmacy Antibiotic Note  Danielle [name] is a 71 y.o. female admitted on 10/11/2017 with intra-abdominal infection s/p hernia repair.  Pharmacy has been consulted for vancomycin dosing.  [name] has had a prolonged hospital stay with extensive antibiotic therapy, persistent fevers and leukocytosis. Her regimen was changed from meropenem to aztreonam recently due to concern for drug fevers. Now with profoundly increased leukocytosis  Plan: Vancomycin 500 IV every 12 hours.  Goal trough 15-20 mcg/mL.  Obtain vancomycin trough prior to 4th or 5th dose Continue aztreonam, metronidazole Monitor renal function, clinical status, and length of therapy    Height: 5' 4.5" (163.8 cm) Weight: 123 lb 0.3 oz (55.8 kg) IBW/kg (Calculated) : 55.85  Temp (24hrs), Avg:99.6 F (37.6 C), Min:98.6 F (37 C), Max:100.7 F (38.2 C)  Recent Labs  Lab 10/30/17 2320 10/31/17 0145  11/01/17 0230 11/02/17 0417 11/03/17 0002 11/03/17 0500 11/04/17 0500  WBC  --   --    < > 20.7* 19.5* 29.3* 24.3* 47.5*  CREATININE  --   --   --  0.87 0.77 0.84 0.83 0.94  LATICACIDVEN 1.0 1.1  --   --   --  2.2* 1.7  --    < > = values in this interval not displayed.    Estimated Creatinine Clearance: 49.8 mL/min (by C-G formula based on SCr of 0.94 mg/dL).    Allergies  Allergen Reactions  . Zithromax [Azithromycin] Swelling and Other (See Comments)    Mouth swelling and blisters   . Aspirin Other (See Comments)    Stomach cramping   . Ibuprofen Other (See Comments)    Pt states that she prefers not to take this medication.   . Iron Nausea And Vomiting and Other (See Comments)    FERROUS SULFATE  . Sulfa Antibiotics Nausea And Vomiting    Antimicrobials this admission: Vancomycin 11/8 >> 11/11; 11/15 >> 11/16 (surg ppx); 11/29>> Zosyn 11/8 >> 11/21 Anidulafungin 11/9 >> Levaquin 11/21 >> 11/21 Meropenem 11/21 >>11/27 Linezolid 11/25>>11/28 Aztreonam 11/27>> Flagyl 11/27>>   Microbiology  results: 11/8 BCx: neg 11/8 MRSA PCR: neg 11/10 CDiff: neg/neg 11/10 GI Panel PCR: neg 11/12 left pleural fluid: neg 11/12 L pleural fluid (fungus): neg (held for 7 days) 11/13 BCx: neg 11/14 UC: neg 11/15 Mediastinal fluid x 2: neg 11/15 AFB smear: neg 11/15 L pleural fluid (fungus): no fungus observed on KOH/Calcofluor prep per note 11/5 L mediastinal fluid (fungus): no fungus observed on KOH/Calcofluor prep per note 11/18 BCx (fungus): neg 11/18 BCx: neg 11/21 UCx: neg 11/21 BCx: neg 11/23 Abdominal Abscess: rare GPC in pairs, rare budding yeast 11/23 CDiff: neg for antigen and toxin 11/25 BCx: ngtd  Thank you for allowing pharmacy to be a part of this patient's care.   Charlton Haws, PharmD PGY1 Pharmacy Resident ID pharmacist phone # [phone] Main pharmacy: [phone] 11/04/17 9:49 AM

## 2017-11-04 NOTE — Progress Notes (Signed)
Critical ABG shown to MD Mannam. RR increased from 18 to 26.

## 2017-11-05 ENCOUNTER — Inpatient Hospital Stay (HOSPITAL_COMMUNITY): Payer: Medicare HMO

## 2017-11-05 LAB — CULTURE, BLOOD (ROUTINE X 2)
CULTURE: NO GROWTH
CULTURE: NO GROWTH
SPECIAL REQUESTS: ADEQUATE
SPECIAL REQUESTS: ADEQUATE

## 2017-11-06 NOTE — Progress Notes (Signed)
PCCM INTERVAL PROGRESS NOTE  At this point I have had several conversations with family. RN Jabier Gauss present for these discussions. They understand Mrs. Wegman has a poor and worsening prognosis and is currently requiring life support by means of ventilator and pressors with escalating demands. I discussed code status with them fearing that cardiac arrest was likely over the course of the night. They made it clear that she would not want to be on life support to begin with and they above all do not want her to suffer. They have elected to pursue palliative care.  Plan: Withdrawal orderset placed DC nimbex Once TO4 is back to baseline will DC giapreza, then levophed If she is able to survive the discontinuation of pressors, which is unlikely, will plan for extubation.  Chaplain offered to family.   Georgann Housekeeper, AGACNP-BC [facility] Pulmonology/Critical Care Pager [phone] or [phone]  Nov 22, 2017 1:08 AM

## 2017-11-06 NOTE — Progress Notes (Signed)
Patients need for levo increasing and pt with labored, irregular respirations. Dr. Halford Chessman notified. New orders placed. Will continue to closely monitor.

## 2017-11-06 NOTE — Progress Notes (Signed)
Patient extubated as per withdrawal of life sustaining treatment orders.

## 2017-11-06 NOTE — Progress Notes (Signed)
Family at bedside. Will plan to shut pressors off when they are ready. Emotional support given.

## 2017-11-06 NOTE — Progress Notes (Signed)
Pt family request removal of ETT tube for comfort. Respiratory called and extubated at 06:30. No breath or heart sounds ausciltated.  Patient time of death 06:33. Family at bedside, tearful but supportive, emotional support given to family.

## 2017-11-06 NOTE — Progress Notes (Signed)
Nimbex turned off at 1:16. At this time there are no twitches with the train of four and patient is breathing with the ventilator. No evidence of paralytic wearing off. Family at bedside and updated. Pressors still currently running. Will continue to monitor closely.

## 2017-11-06 NOTE — Progress Notes (Signed)
TO4 back to baseline. Family notified. Will continue pressors until family arrives.

## 2017-11-06 DEATH — deceased

## 2017-11-08 LAB — CULTURE, FUNGUS WITHOUT SMEAR

## 2017-11-09 ENCOUNTER — Ambulatory Visit: Payer: Medicare HMO | Admitting: Thoracic Surgery (Cardiothoracic Vascular Surgery)

## 2017-11-17 ENCOUNTER — Telehealth: Payer: Self-pay

## 2017-11-17 NOTE — Telephone Encounter (Signed)
On 11/17/17 I received a d/c from [facility] (original). The d/c is for cremation. The patient is a patient of [employer] international [employer]. The d/c will be taken to [facility] for signature.  On 11/22/17 I received the d/c back from [facility] for [facility]. I got the death certificate ready and called the funeral home to let them know the d/c is ready for pickup. I also faxed a copy to the funeral home per the funeral home request.

## 2017-11-20 LAB — FUNGUS CULTURE RESULT

## 2017-11-20 LAB — FUNGAL ORGANISM REFLEX

## 2017-11-20 LAB — FUNGUS CULTURE WITH STAIN

## 2017-11-21 LAB — FUNGUS CULTURE WITH STAIN

## 2017-11-21 LAB — FUNGAL ORGANISM REFLEX

## 2017-11-21 LAB — FUNGUS CULTURE RESULT

## 2017-12-05 LAB — ACID FAST CULTURE WITH REFLEXED SENSITIVITIES: ACID FAST CULTURE - AFSCU3: NEGATIVE

## 2017-12-05 LAB — ACID FAST CULTURE WITH REFLEXED SENSITIVITIES (MYCOBACTERIA): Acid Fast Culture: NEGATIVE

## 2017-12-07 NOTE — Discharge Summary (Addendum)
Physician Discharge Summary  Patient ID: Danielle [name] MRN: [id number] DOB/AGE: 1946-06-28 72 y.o.  Admit date: 10/17/2017 Discharge date: 11-24-17  Admission Diagnoses: Mediastinal abscess   Discharge Diagnoses:  Principal Problem:   Septic shock ([place]) Active Problems:   Leukocytosis   Perianal Crohn's disease, with fistula ([place])   Acute renal failure superimposed on stage 3 chronic kidney disease (HCC)   Hiatal hernia   Immunosuppressed status (HCC)   Chest tube in place   Acute respiratory failure (HCC)   Hypotension   Vomiting in adult patient   S/P thoracentesis   Anasarca   DVT (deep venous thrombosis) (HCC)   Protein-calorie malnutrition, moderate (HCC)   Mediastinal abscess (HCC)   Mediastinal post-op seromas   Pleural effusion, left   Patient's noncompliance with intervention / medical advice    Dehydration   Acute embolism and thrombosis of left axillary vein (HCC)   Goals of care, counseling/discussion   Palliative care encounter   Fever   Intra-abdominal infection   Hemoptysis   Abscess   Intractable vomiting with nausea   ARDS (adult respiratory distress syndrome) [facility])  Discharged Condition: Deceased  Hospital Course:  This is a 72 year old female with a very complicated medical history who is readmitted to [facility] on 11/8 with chief complaint of nausea and vomiting.  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  She was treated with parenteral antifungals.  She eventually was able to ambulate improved to the point where she was discharged she was to be discharged on IV Invanz as well his Eraxis for 2 more weeks via PICC line. She was again readmitted on 11 8 with chief complaint of nausea and vomiting CT of abdomen and pelvis  was obtained and this showed reaccumulation of fluid collection in the lower mediastinum as well as bibasilar pleural effusions and another fluid collection along the posterior margin of the spleen.  She ultimately underwent video-assisted thoracotomy with chest tube placement on 11/15 as well as new percutaneous drainage placed in the anterior abdominal abscess on the 11/23.  She has been getting IV nutrition via T and A.  Infectious disease has been following and helping with antibiotics.  On 11/28 she was spiking fever as high as 104.6.  Because of this she had been on [employer], anidulafungin, and meropenem.  We switched to meropenem to aztreonam and Flagyl on 11/27 with  concern for drug fever.  There is also control about concern about adequate source control still.  She apparently had a vomiting episode the evening of 11/28 had been getting progressively tachypneic and developed worsening respiratory failure.  Critical care was asked to see her on 11/29 when she developed worsening respiratory failure requiring 100%  FiO2 on noninvasive ventilation and still had marked paroxysmal breathing with respiratory rate in the 40s.  A stat chest x-ray demonstrated a new infiltrate involving the right upper middle lobe.  She was intubated emergently and brought to the ICU.  Through the course of the night she went into severe ARDS with septic shock and refractory acidosis.  Placed on oxygen at ventilation, bicarb drip and multiple pressors including giapressa.  ICU team had several conversations with the family who understood her dismal prognosis with escalating ventilator and pressor demands.  The family made it clear  that no want her to suffer further and decided to withdraw care.  She was made comfort care with terminal extubation and passed away shortly thereafter.  Consults: Palliative care, Gastroenterology, Cardiothoracic surgery, Infectious diseases,   Disposition: 20-Expired   Allergies as of  Nov 16, 2017      Reactions   Zithromax [azithromycin] Swelling, Other (See Comments)   Mouth swelling and blisters    Aspirin Other (See Comments)   Stomach cramping    Ibuprofen Other (See Comments)   Pt states that she prefers not to take this medication.    Iron Nausea And Vomiting, Other (See Comments)   FERROUS SULFATE   Sulfa Antibiotics Nausea And Vomiting      Medication List    ASK your doctor about these medications   acetaminophen 325 MG tablet Commonly known as:  TYLENOL Take 2 tablets (650 mg total) by mouth every 6 (six) hours as needed for mild pain or moderate pain.   albuterol 108 (90 Base) MCG/ACT inhaler Commonly known as:  PROVENTIL HFA;VENTOLIN HFA Inhale 2 puffs into the lungs every 6 (six) hours as needed for wheezing.   apixaban 5 MG Tabs tablet Commonly known as:  ELIQUIS Take 10 mg twice a day for 7 days and then start using 5 mg BID.   cyanocobalamin 1000 MCG/ML injection Commonly known as:  (VITAMIN B-12) INJECT 1000 MCG IM ONCE MONTHLY   dicyclomine 10 MG/5ML syrup Commonly known as:  BENTYL TAKE 10 MLS (20 MG TOTAL) BY MOUTH 3 (THREE) TIMES DAILY AS NEEDED.   diphenoxylate-atropine 2.5-0.025 MG tablet Commonly known as:  LOMOTIL TAKE 1 TABLET BY MOUTH FOUR TIMES A DAY AS NEEDED   feeding supplement Liqd Take 1 Container by mouth 3 (three) times daily between meals.   furosemide 20 MG tablet Commonly known as:  LASIX Take 1 tablet (20 mg total) by mouth daily as needed (Take for weight gain of more than 2 pounds in a day or 5 pounds in 2 days.).   HYDROcodone-acetaminophen 7.5-325 MG tablet Commonly known as:  NORCO Take 1 tablet by mouth every 6 (six) hours as needed for moderate pain or severe pain.   magnesium oxide 400 (241.3 Mg) MG tablet Commonly known as:  MAG-OX Take 1 tablet (400 mg total) by mouth daily.   nystatin powder Generic drug:  nystatin Apply 1 g topically 2 (two) times daily as needed (for itching/irritation).    pantoprazole 40 MG tablet Commonly known as:  PROTONIX Take 1 tablet (40 mg total) by mouth daily.   prochlorperazine 10 MG tablet Commonly known as:  COMPAZINE Take 1 tablet (10 mg total) by mouth every 6 (six) hours as needed for refractory nausea / vomiting.   promethazine 25 MG tablet Commonly known as:  PHENERGAN TAKE 1 TABLET (25 MG TOTAL) BY MOUTH 2 (TWO) TIMES DAILY AS NEEDED FOR NAUSEA OR VOMITING.   sodium bicarbonate 650 MG tablet Take 650 mg by mouth 2 (two) times daily.   spironolactone 50 MG tablet Commonly known as:  ALDACTONE Take 50 mg by mouth daily.      Signed: Bassem Bernasconi 11/19/2017, 1:47 PM

## 2017-12-13 NOTE — Progress Notes (Signed)
Patient is deceased.

## 2017-12-28 ENCOUNTER — Ambulatory Visit: Payer: Medicare HMO | Admitting: Internal Medicine

## 2018-02-17 IMAGING — CT CT ABD-PELV W/ CM
2 of 8 series · 14 of 46 positions shown, 16 images · IV contrast (isovue)
Comparison: CT of the abdomen and pelvis from 07/30/2015

CLINICAL DATA: Acute onset of nausea, vomiting and diarrhea. Upper
abdominal pain. Current history of Crohn's disease. Leukocytosis.
Red blood cells and white blood cells in the urine. Initial
encounter.

EXAM:
CT ABDOMEN AND PELVIS WITH CONTRAST
TECHNIQUE: Multidetector CT imaging of the abdomen and pelvis was performed
using the standard protocol following bolus administration of
intravenous contrast.
CONTRAST:  100 mL of Isovue 300 IV contrast

[Series 2: abd/pel with · axial · 0.71mm/px · z∈[+1256,+1606]mm · 11 of 82 slices shown, 13 images]
[im 6/82  soft-tissue]
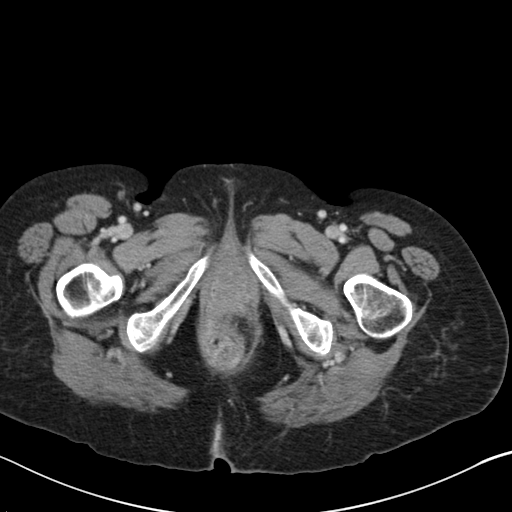
[im 6/82  bone]
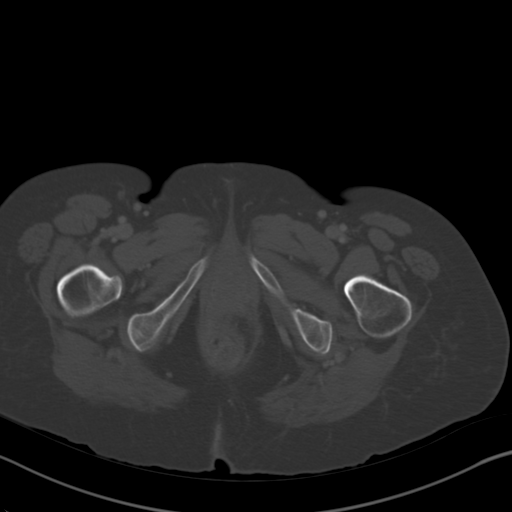
[im 16/82  soft-tissue]
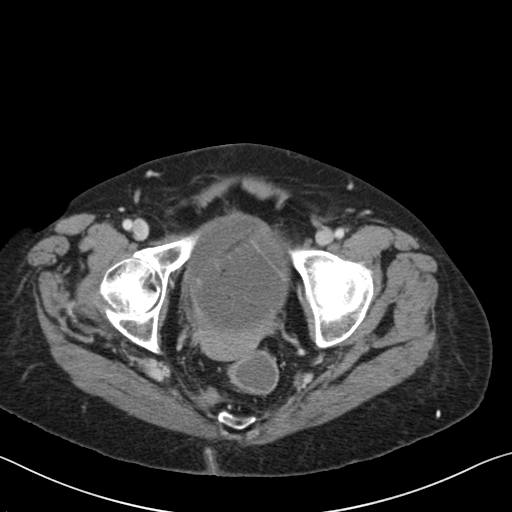
[im 21/82  soft-tissue]
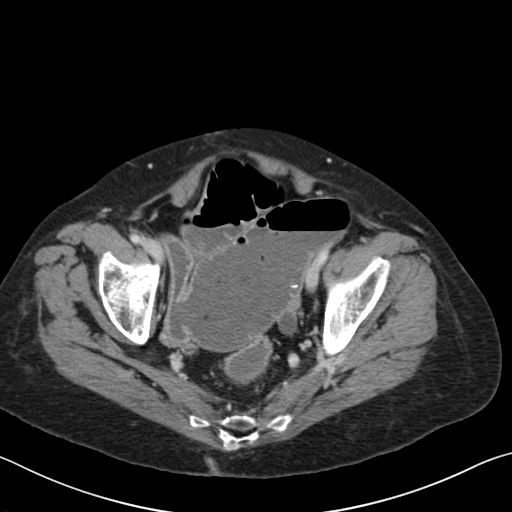
[im 26/82  soft-tissue]
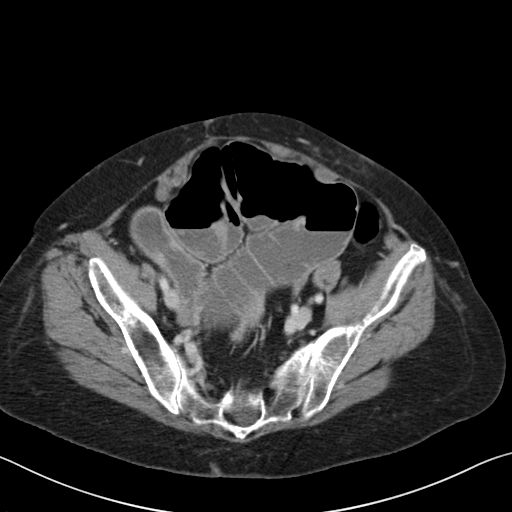
[im 36/82  soft-tissue]
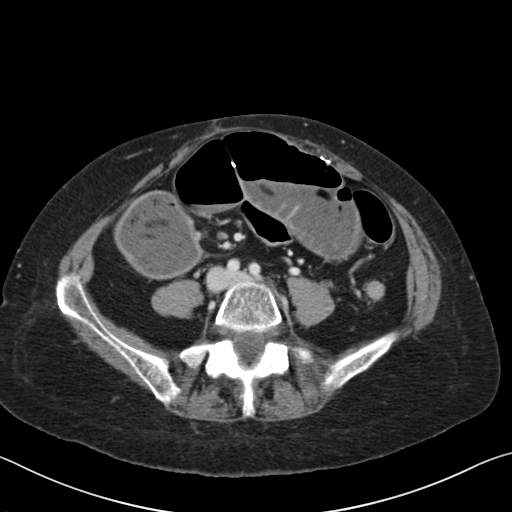
[im 41/82  soft-tissue]
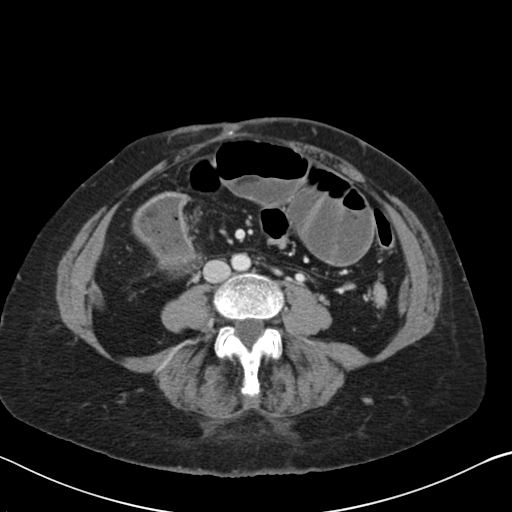
[im 46/82  soft-tissue]
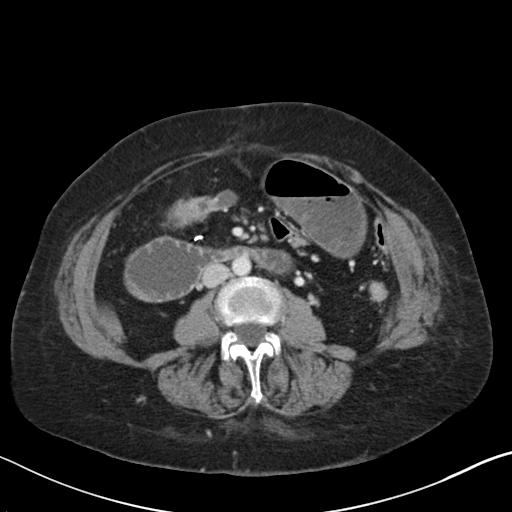
[im 56/82  soft-tissue]
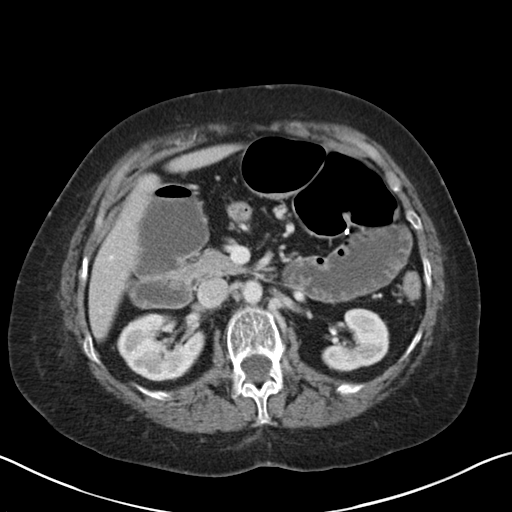
[im 61/82  soft-tissue]
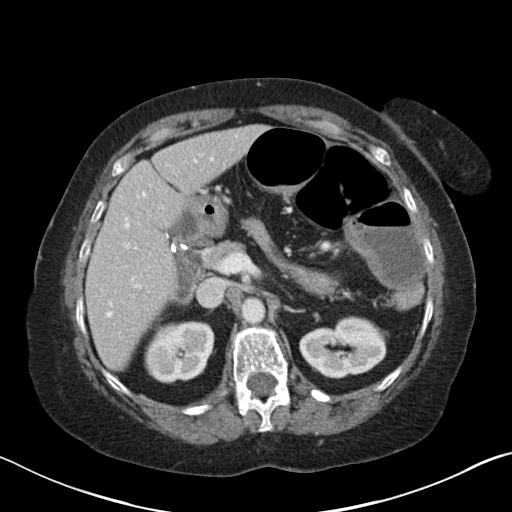
[im 61/82  bone]
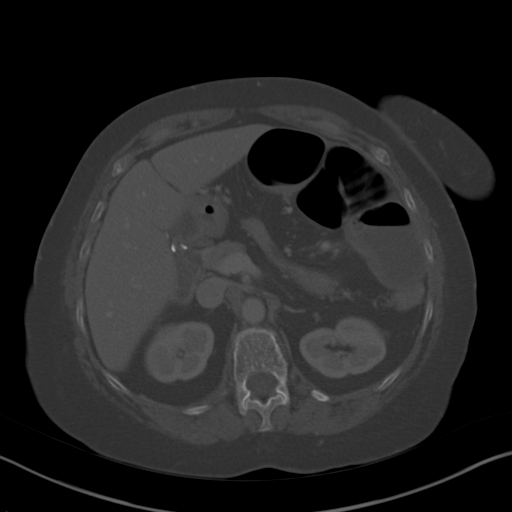
[im 66/82  soft-tissue]
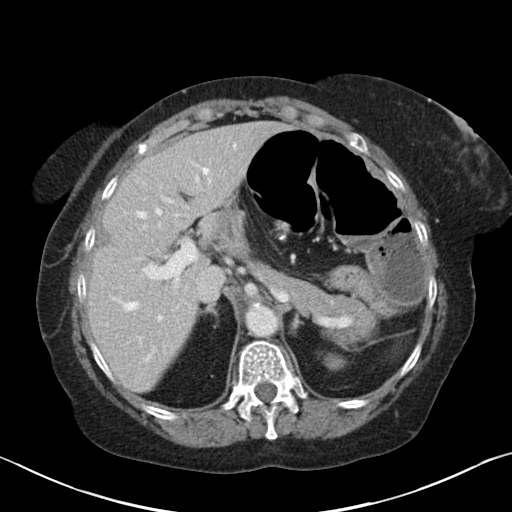
[im 76/82  soft-tissue]
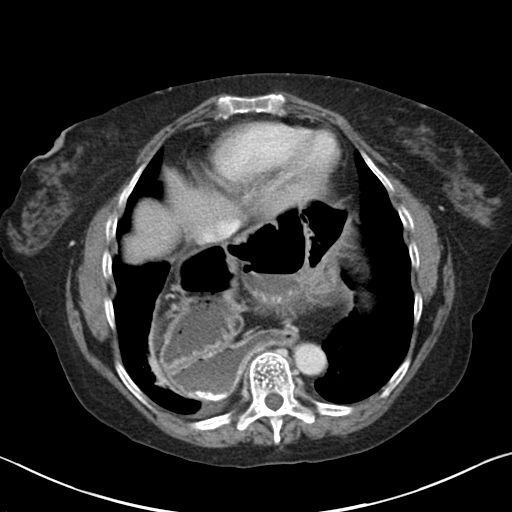

[Series 5: coronal a/|p · coronal · 0.60mm/px · 3 of 137 slices shown]
[im 28/137  soft-tissue]
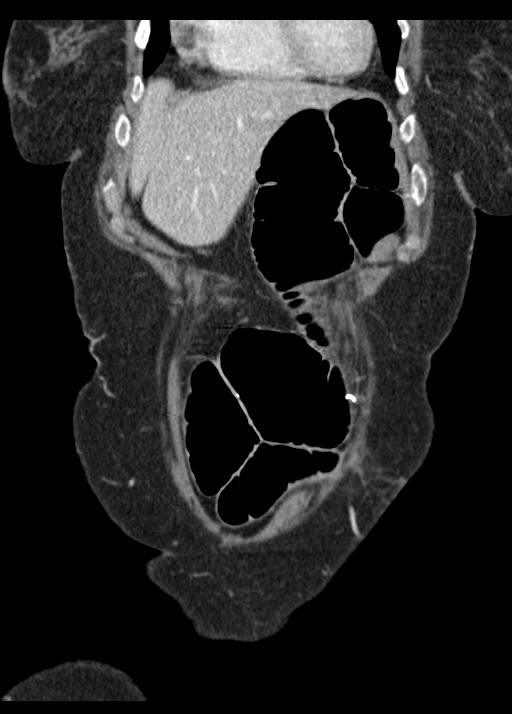
[im 55/137  soft-tissue]
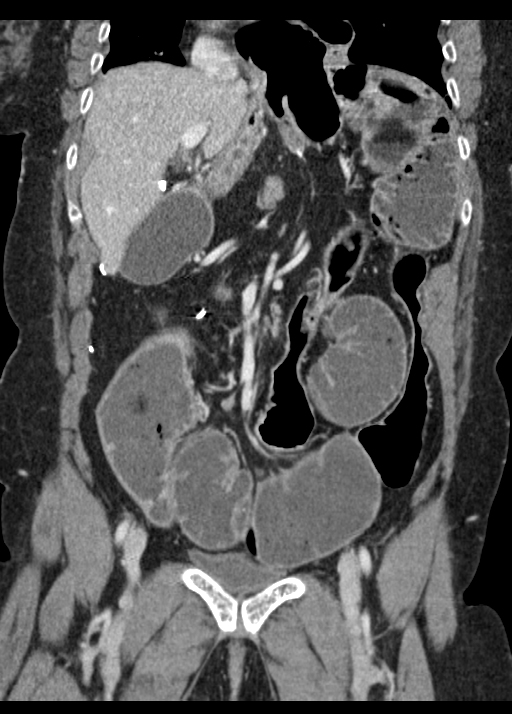
[im 82/137  soft-tissue]
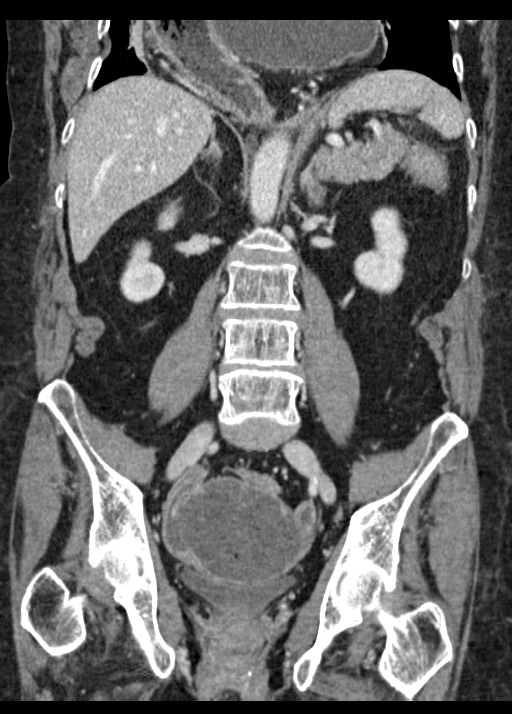

[14 of 46 positions shown; findings below may reference images not displayed]

FINDINGS: Lower chest: Atelectasis is noted about the patient's very large
hiatal hernia. The visualized portions of the mediastinum
unremarkable.

The hernia contains a distended loop of small bowel, as well as much
of the stomach.

Hepatobiliary: The liver is unremarkable in appearance. The patient
is status post cholecystectomy, with clips noted at the gallbladder
fossa. The common bile duct remains normal in caliber.

Pancreas: The pancreas is within normal limits.

Spleen: The spleen is unremarkable in appearance.

Adrenals/Urinary Tract: The adrenal glands are unremarkable in
appearance. The kidneys are within normal limits. There is no
evidence of hydronephrosis. No renal or ureteral stones are
identified. No perinephric stranding is seen.

Stomach/Bowel: The patient is status post resection of portions of
the small bowel and colon. Remaining visualized small bowel loops
are diffusely dilated, measuring up to 6.9 cm in diameter. The colon
is largely decompressed, with mild wall thickening along the
ascending colon. This may correspond to the patient's Crohn's
disease.

The stomach is otherwise unremarkable.

Vascular/Lymphatic: The abdominal aorta is unremarkable in
appearance. The inferior vena cava is grossly unremarkable. No
retroperitoneal lymphadenopathy is seen. No pelvic sidewall
lymphadenopathy is identified.

Reproductive: The bladder is mildly distended and grossly
unremarkable. A small uterine fibroid is noted. The uterus is
otherwise unremarkable. No suspicious adnexal masses are seen.

Other: A small 2.4 x 0.6 cm collection of fluid and air along the
right side of the anorectal canal raises concern for trace abscess.
Diffuse soft tissue inflammation tracks near the gluteal cleft, with
minimal fluid, concerning for perianal fistulas as previously noted.

Musculoskeletal: No acute osseous abnormalities are identified.
There is chronic compression deformity of vertebral body L1. The
visualized musculature is unremarkable in appearance.
IMPRESSION: 1. Diffuse dilatation of remaining small bowel loops, measuring up
to 6.9 cm in diameter. The colon is largely decompressed, with mild
irregular wall thickening along the ascending colon. Mild
surrounding soft tissue inflammation seen. This may reflect
exacerbation of the patient's Crohn's disease. Mass is considered
less likely.
2. Very large hiatal hernia is again noted, containing much of the
stomach and a distended segment of small bowel.
3. Perianal fistulas again noted, with a small 2.4 x 0.6 cm
collection of fluid and air along the right side of the anorectal
canal, concerning for trace abscess. Diffuse surrounding soft tissue
inflammation tracking near the gluteal cleft.
4. Chronic compression deformity of vertebral body L1.
5. Small uterine fibroids seen.
6. Atelectasis about the very large hiatal hernia.

## 2018-05-07 IMAGING — CR DG CHEST 2V
2 series · 2 of 2 positions shown · non-contrast
Comparison: 02/03/2017, 10/16/2016, 10/06/2016

CLINICAL DATA: Chest pain

EXAM:
CHEST  2 VIEW

[chest ap]
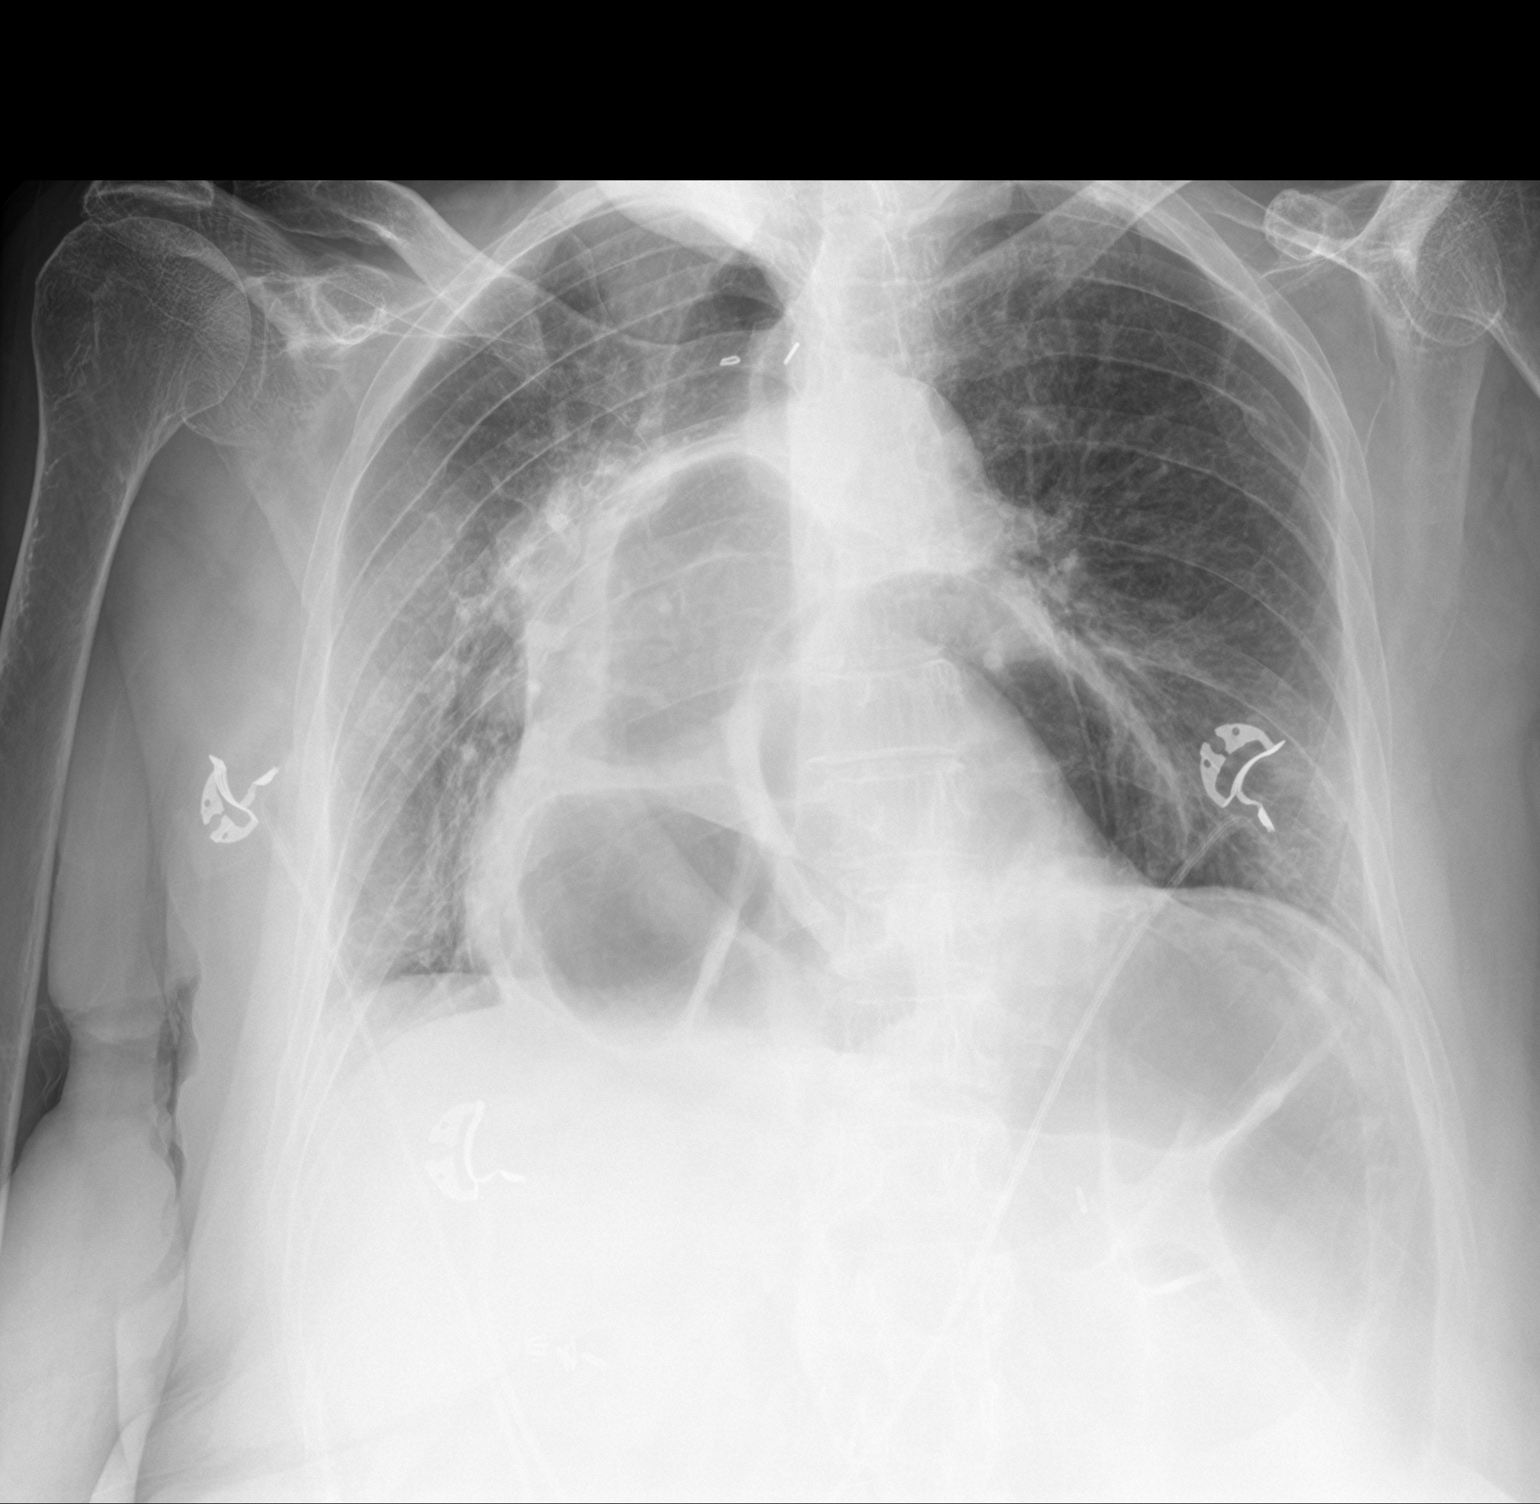

[chest lat]
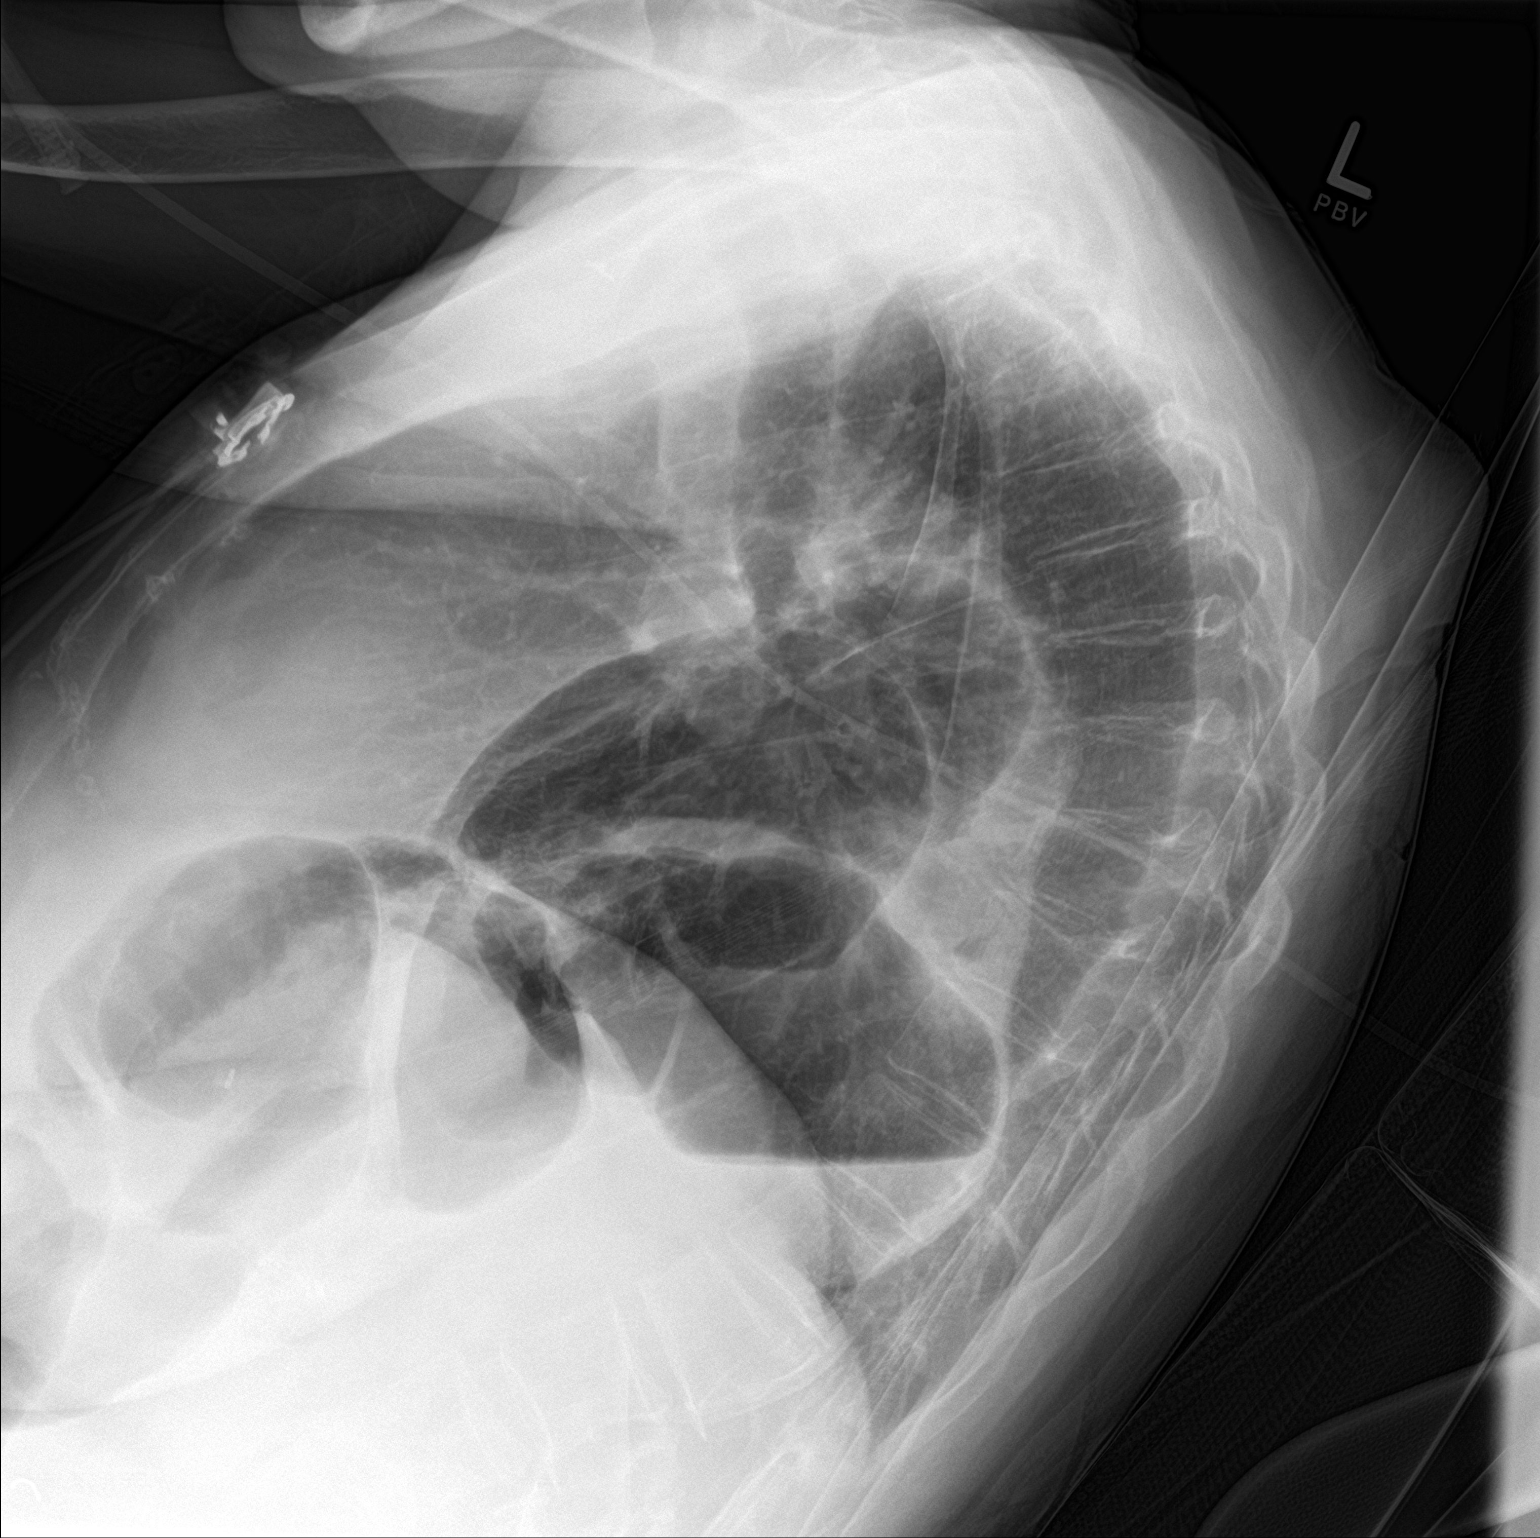

[2 of 2 positions shown; findings below may reference images not displayed]

FINDINGS: Large hiatal hernia containing dilated bowel, increased compared to
10/16/2016, similar compared with 09/18/2016. No pleural effusion or
focal consolidation. Stable cardiomediastinal silhouette. No
pneumothorax. Surgical clips in the right upper quadrant.
IMPRESSION: No focal consolidation or effusion. Large hiatal hernia containing
air filled dilated bowel.

## 2018-05-09 IMAGING — MR MR MRA HEAD W/O CM
7 series · 36 of 48 positions shown · non-contrast
Comparison: CT head without contrast from the same day.

CLINICAL DATA: Patient hospitalized 4 days ago for I & D of
perirectal abscess. New onset of right-sided facial numbness and
tingling beginning at [DATE] a.m. today. Possible right facial droop.

The examination had to be discontinued prior to completion due to
patient refusal for further imaging. Diffusion-weighted imaging,
sagittal T1 weighted sequences, axial gradient sequences, and MRA
time-of-flight sequences were performed.
EXAM:
MRI HEAD WITHOUT CONTRAST
MRA HEAD WITHOUT CONTRAST
TECHNIQUE: Multiplanar, multiecho pulse sequences of the brain and surrounding
structures were obtained without intravenous contrast. Angiographic
images of the head were obtained using MRA technique without
contrast.

[Series 4: DWI · axial · 3.0mm · 1.09mm/px · z∈[-32,+121]mm · 8 of 104 slices shown (1 of 4)]
[im 1/104]
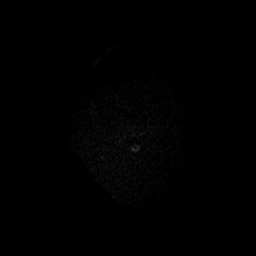
[im 12/104]
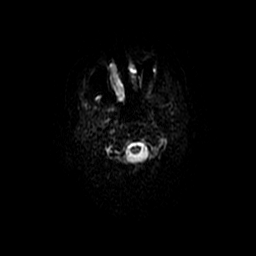
[im 35/104]
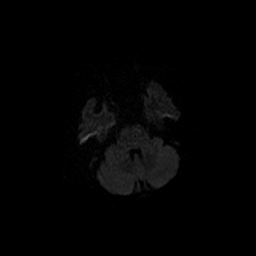
[im 46/104]
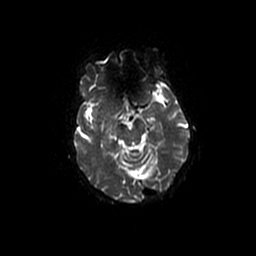
[im 58/104]
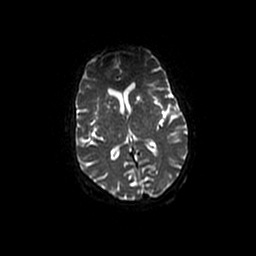
[im 69/104]
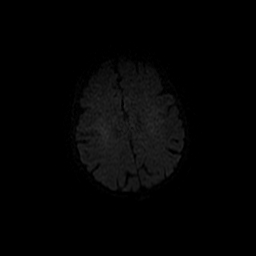
[im 92/104]
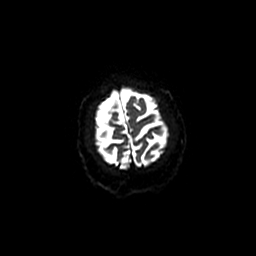
[im 104/104]
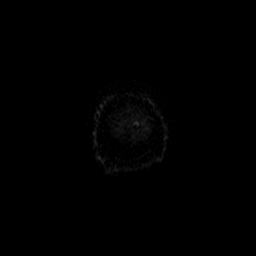

[Series 5: DWI · coronal · 5.0mm · 1.09mm/px · 8 of 76 slices shown (2 of 4)]
[im 1/76]
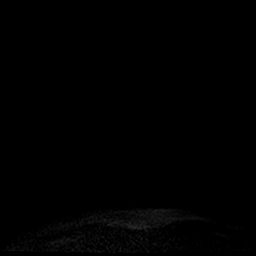
[im 11/76]
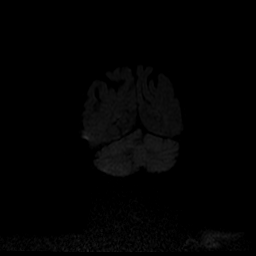
[im 22/76]
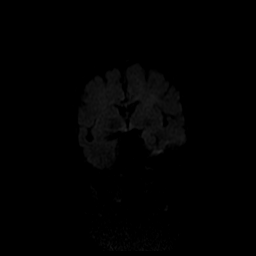
[im 33/76]
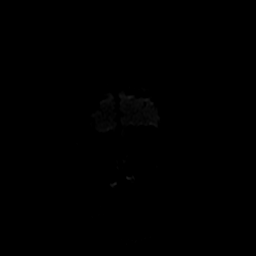
[im 43/76]
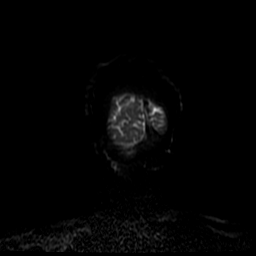
[im 54/76]
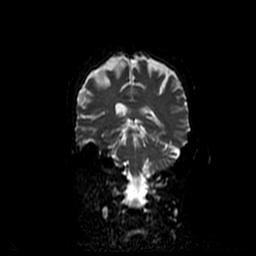
[im 65/76]
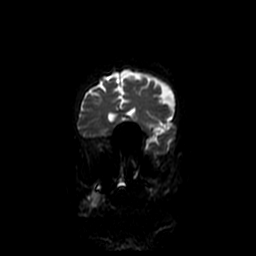
[im 76/76]
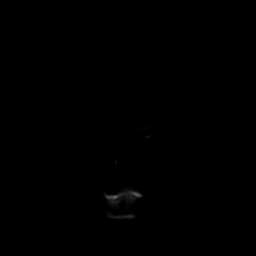

[Series 6: ax mpgr · axial · 5.0mm · 0.43mm/px · z∈[-25,+125]mm · 3 of 26 slices shown]
[im 1/26]
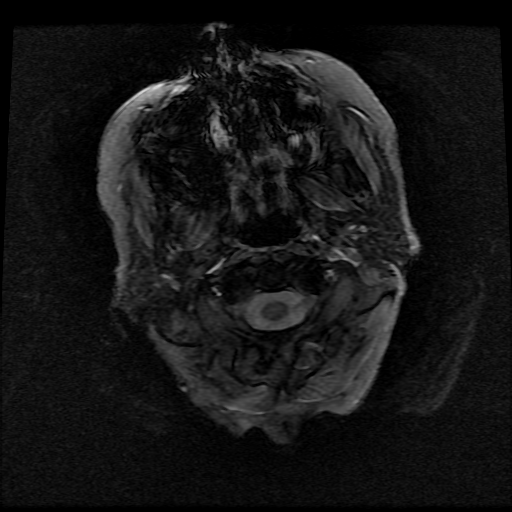
[im 13/26]
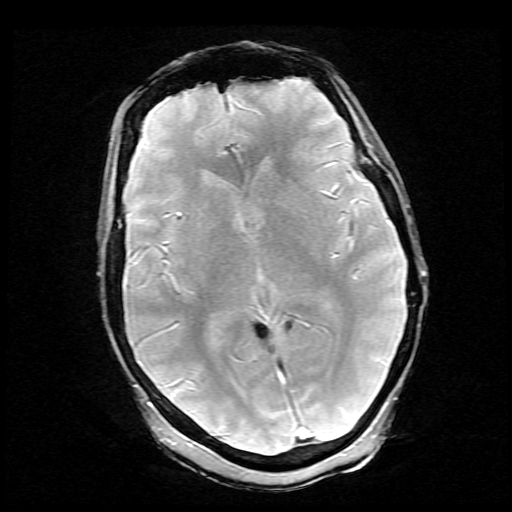
[im 26/26]
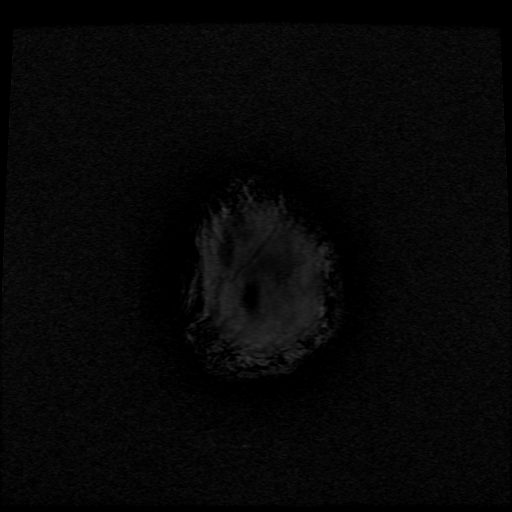

[Series 7: (id) mt fs · axial · 1.4mm · 0.43mm/px · z∈[-21,+51]mm · 6 of 154 slices shown]
[im 11/154]
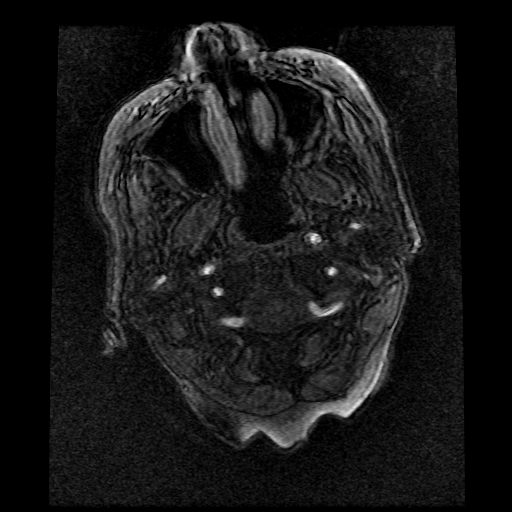
[im 31/154]
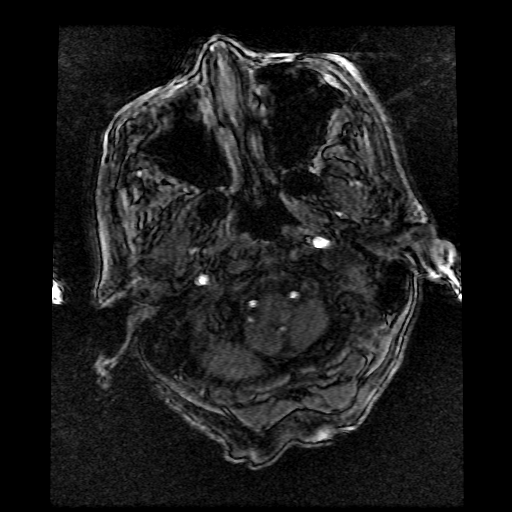
[im 52/154]
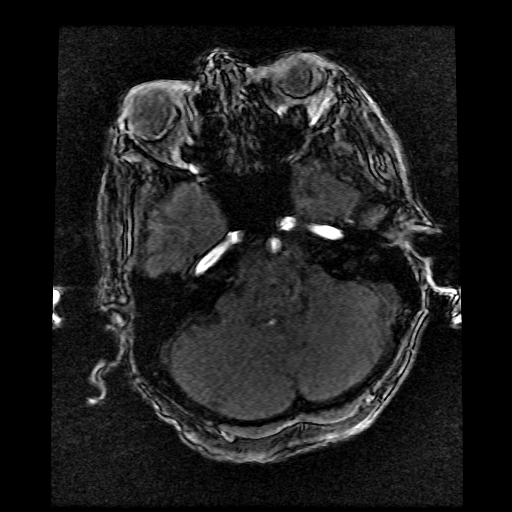
[im 72/154]
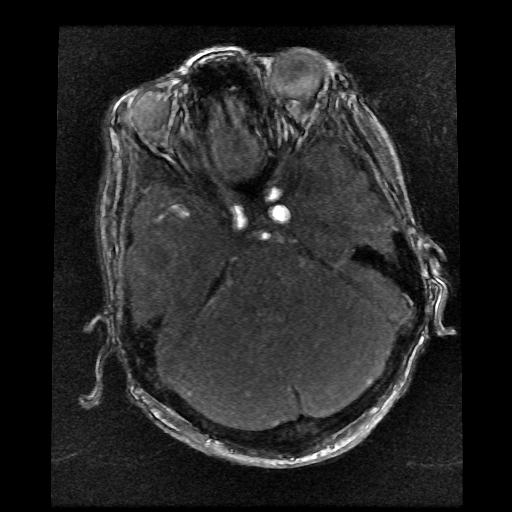
[im 92/154]
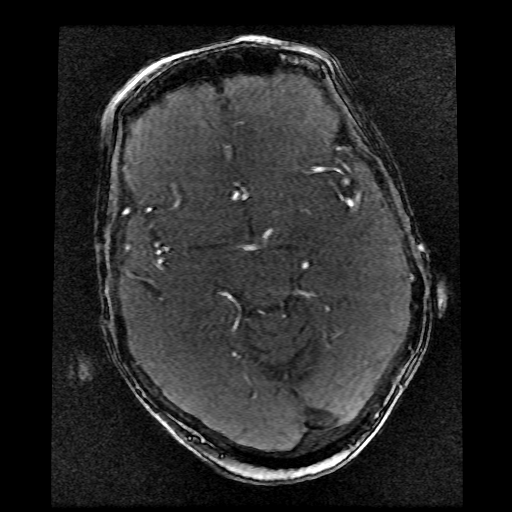
[im 113/154]
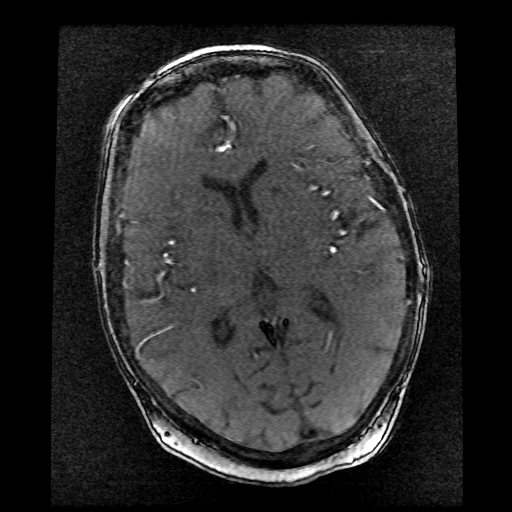

[Series 8: T1 · sagittal · 5.0mm · 0.47mm/px · 2 of 24 slices shown]
[im 1/24]
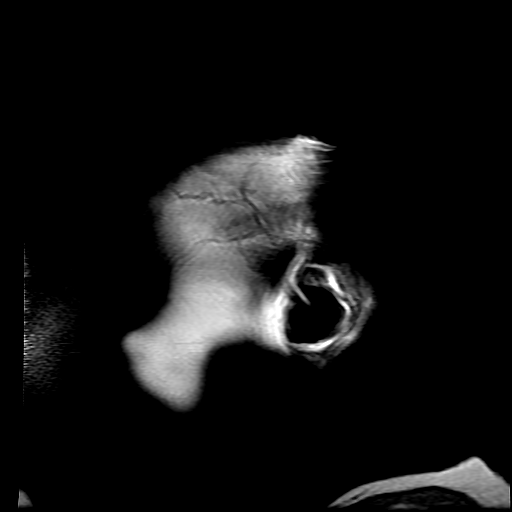
[im 24/24]
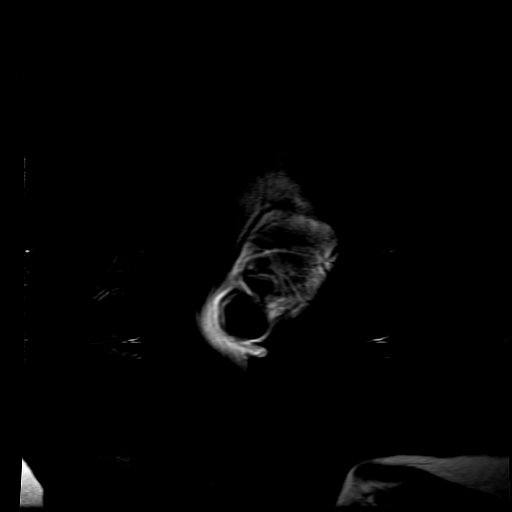

[Series 400: DWI · axial · 3.0mm · 1.09mm/px · z∈[-32,+121]mm · 5 of 52 slices shown (3 of 4)]
[im 1/52]
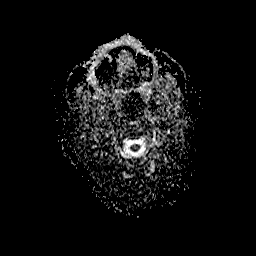
[im 13/52]
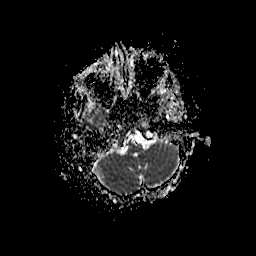
[im 26/52]
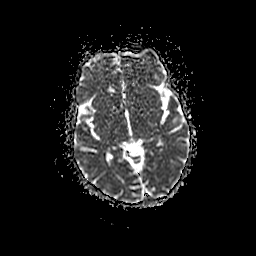
[im 39/52]
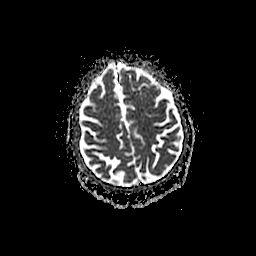
[im 52/52]
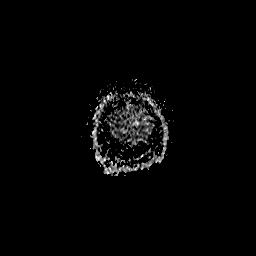

[Series 500: DWI · coronal · 5.0mm · 1.09mm/px · 4 of 38 slices shown (4 of 4)]
[im 1/38]
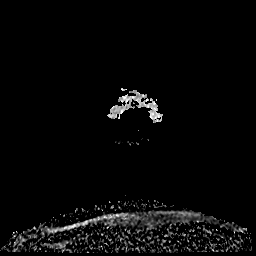
[im 13/38]
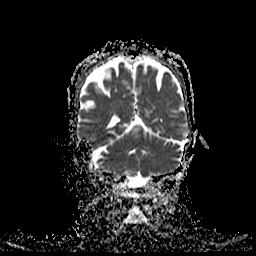
[im 25/38]
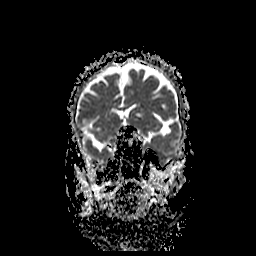
[im 38/38]
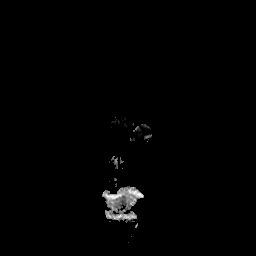

[36 of 48 positions shown; findings below may reference images not displayed]

FINDINGS: MRI HEAD FINDINGS

Brain: A remote lacunar infarct is again noted in the anterior limb
of the left internal capsule. The diffusion-weighted images
demonstrate no acute or subacute infarction. Additional sequences
were not performed.

Vascular: Flow is present in the major intracranial arteries.

Skull and upper cervical spine: The craniocervical junction is
normal. Midline sagittal structures are unremarkable.

Sinuses/Orbits: The paranasal sinuses and mastoid air cells are
clear. The globes and orbits are unremarkable.

MRA HEAD FINDINGS

The internal carotid artery is are within normal limits the high
cervical segments through the ICA termini bilaterally. The A1 and M1
segments are normal. Anterior communicating artery is patent. ACA
can't MCA branch vessels are within normal limits.

The vertebral arteries are codominant. The right PICA origin is
visualized and normal. The left PICA is not visualized. The left
AICA is dominant. Basilar artery is normal. Both posterior cerebral
arteries originate basilar tip. PCA branch vessels are within normal
limits.
IMPRESSION: 1. No acute intracranial abnormality. The study was discontinued
early as the patient refused to complete the exam.
2. Remote lacunar infarct of the anterior left internal capsule.
3. Normal variant MRA circle of Willis without significant proximal
stenosis, aneurysm, or branch vessel occlusion.

## 2018-05-09 IMAGING — CT CT HEAD W/O CM
3 series · 16 of 47 positions shown, 19 images · non-contrast
Comparison: Head CT 08/31/2011

CLINICAL DATA: Right facial numbness

EXAM:
CT HEAD WITHOUT CONTRAST
TECHNIQUE: Contiguous axial images were obtained from the base of the skull
through the vertex without intravenous contrast.

[Series 3: head 5.0 h30s · axial · 0.42mm/px · z∈[-114,+21]mm · 10 of 33 slices shown, 13 images]
[im 3/33  brain]
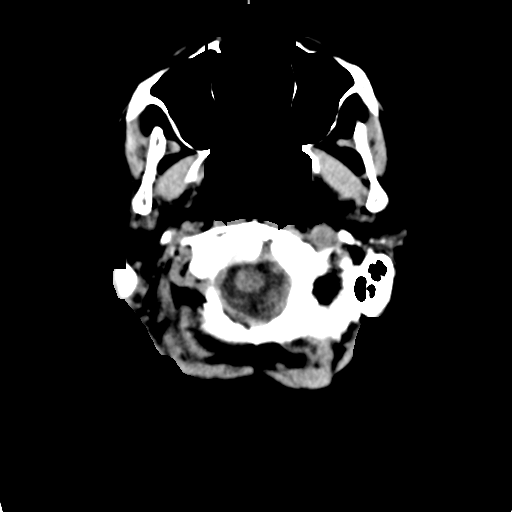
[im 3/33  bone]
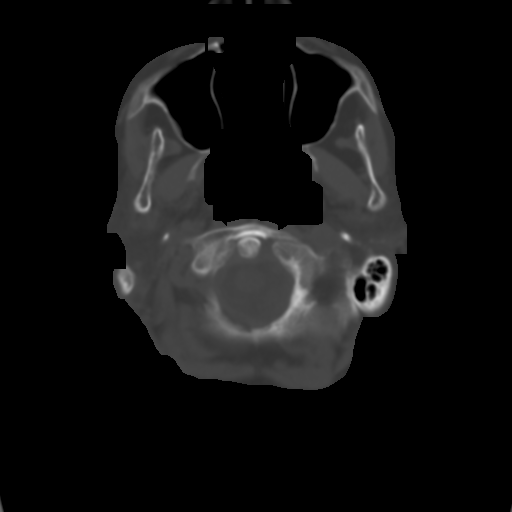
[im 6/33  brain]
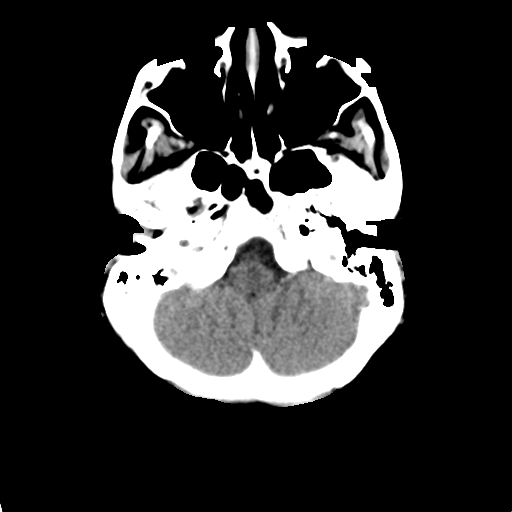
[im 9/33  brain]
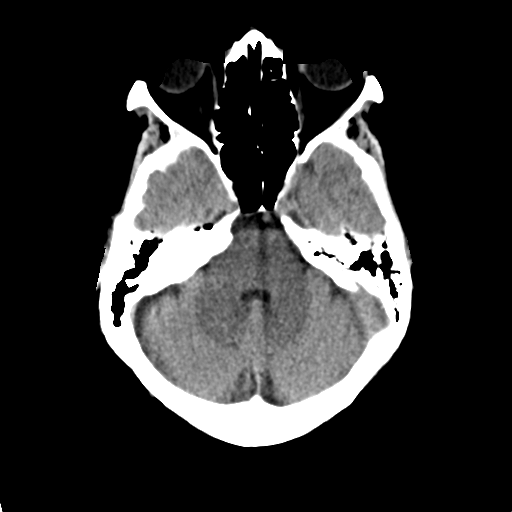
[im 12/33  brain]
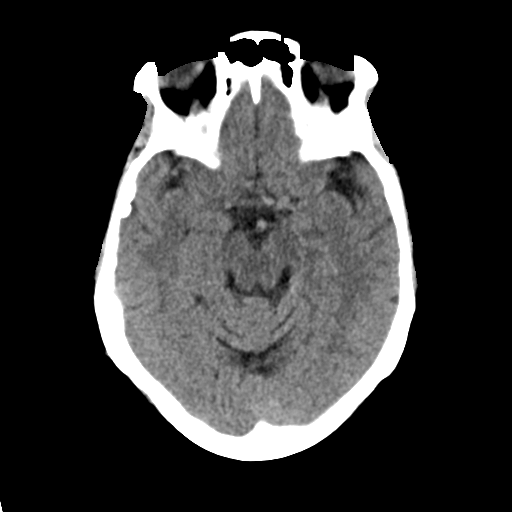
[im 15/33  brain]
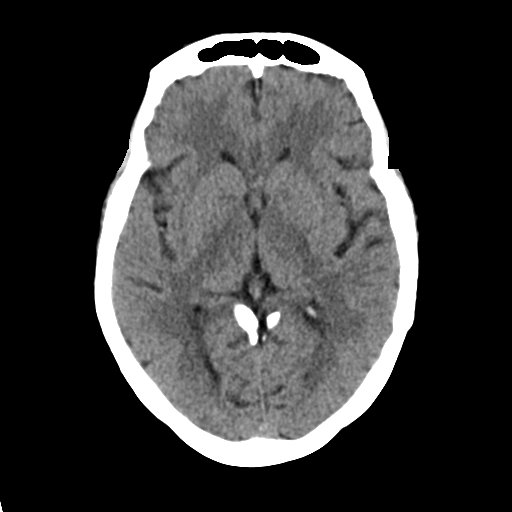
[im 15/33  bone]
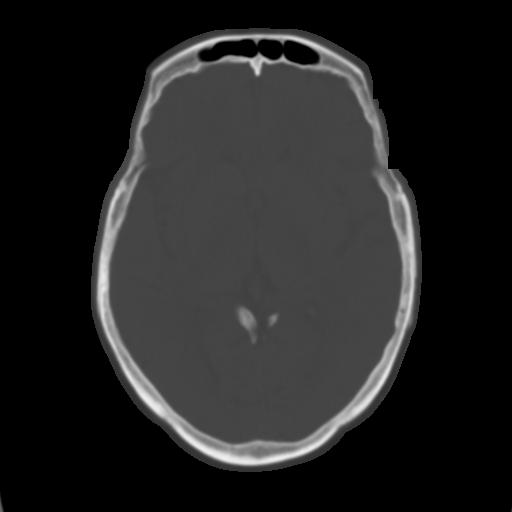
[im 18/33  brain]
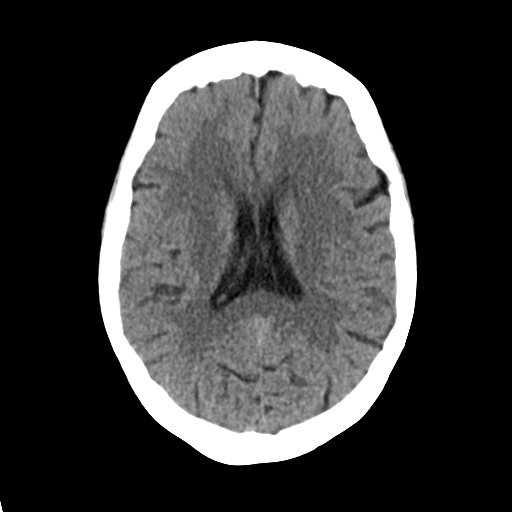
[im 21/33  brain]
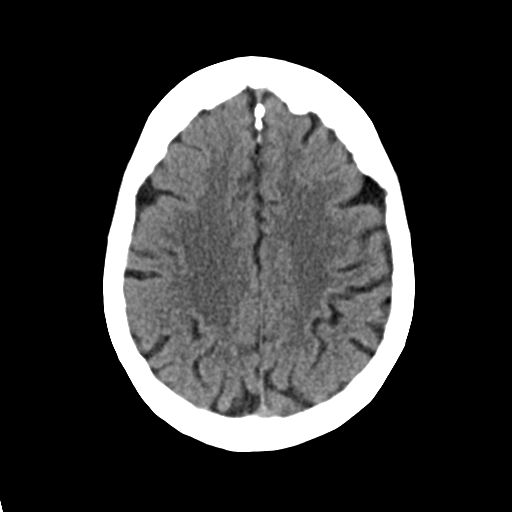
[im 25/33  brain]
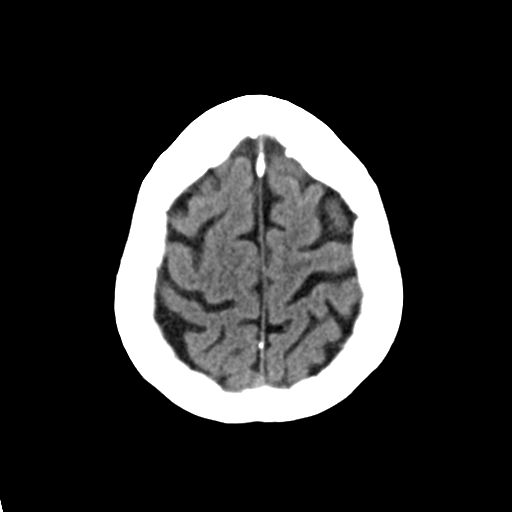
[im 27/33  brain]
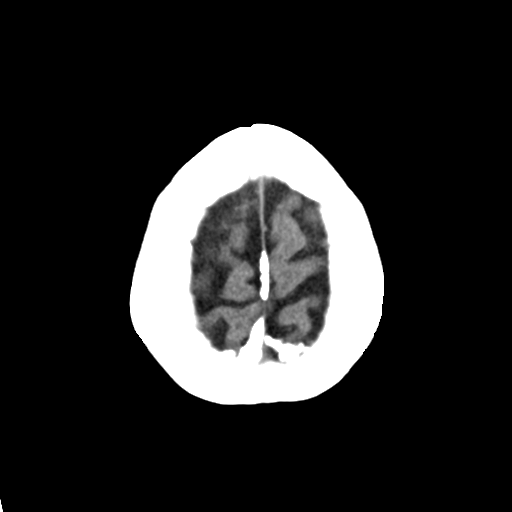
[im 27/33  bone]
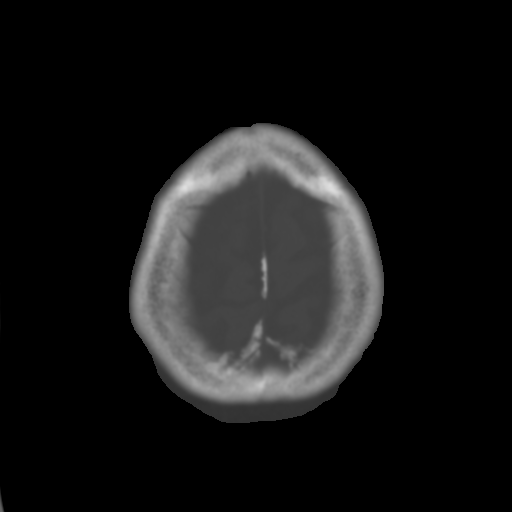
[im 30/33  brain]
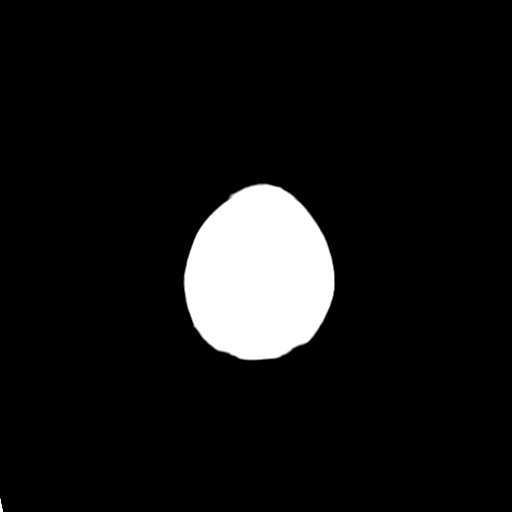

[Series 5: head 3.0 mpr cor · coronal · 0.31mm/px · 3 of 67 slices shown]
[im 23/67  brain]
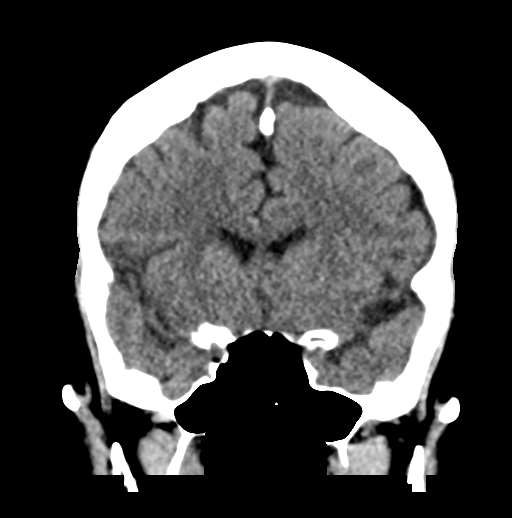
[im 30/67  brain]
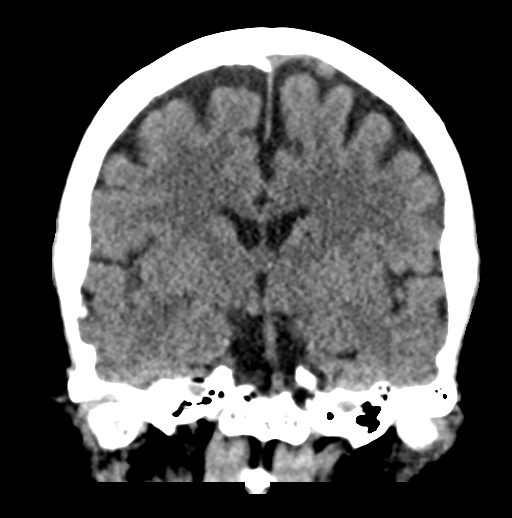
[im 37/67  brain]
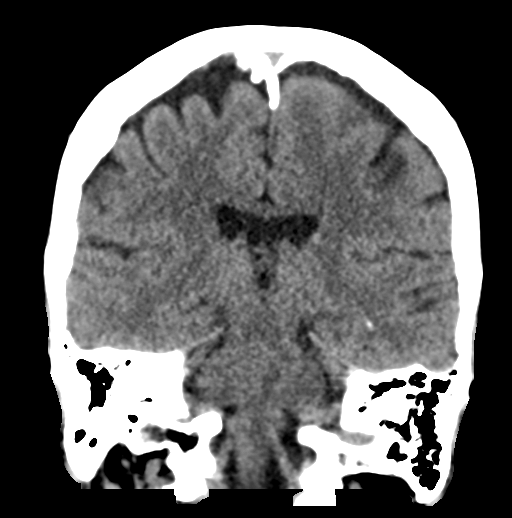

[Series 6: head 3.0 mpr sag · sagittal · 0.33mm/px · 3 of 53 slices shown]
[im 18/53  brain]
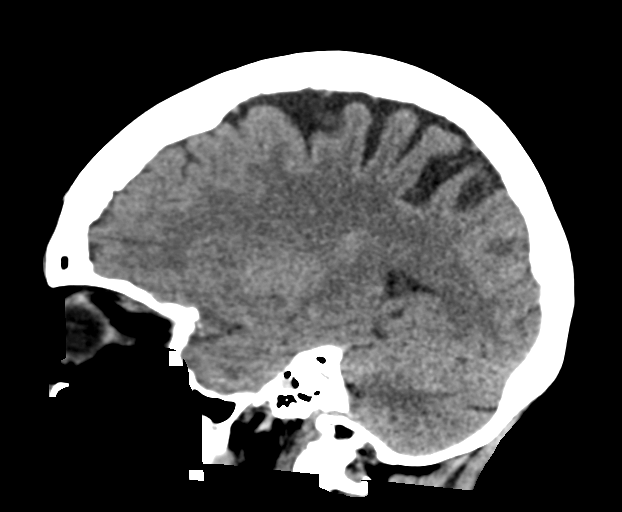
[im 27/53  brain]
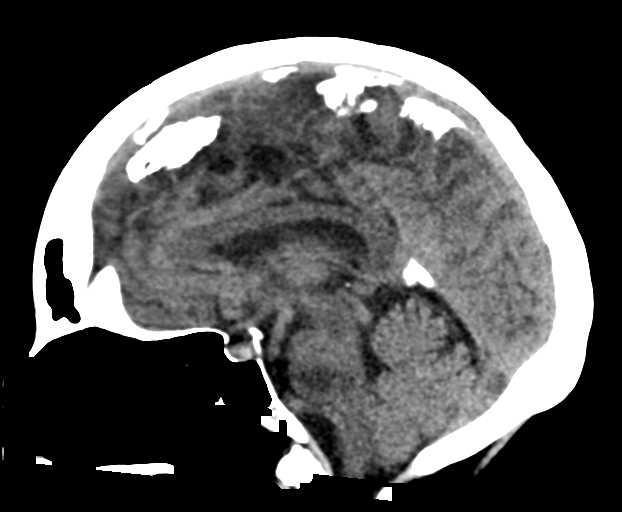
[im 35/53  brain]
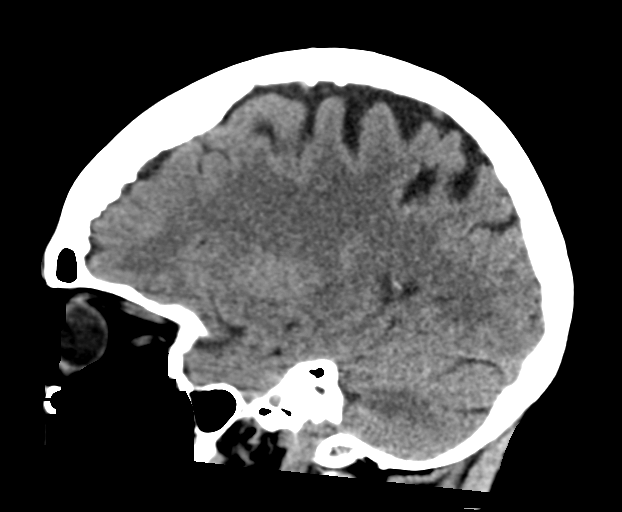

[16 of 47 positions shown; findings below may reference images not displayed]

FINDINGS: Brain: No mass lesion, intraparenchymal hemorrhage or extra-axial
collection. No evidence of acute cortical infarct. Left internal
capsule lacunar infarct is unchanged from 1191. Brain parenchyma is
otherwise normal for age.

Vascular: No hyperdense vessel or unexpected calcification.

Skull: Normal visualized skull base, calvarium and extracranial soft
tissues.

Sinuses/Orbits: No sinus fluid levels or advanced mucosal
thickening. No mastoid effusion. Normal orbits.
IMPRESSION: Unchanged left internal capsule lacunar infarct compared to
08/31/2011. No acute intracranial abnormality.

## 2018-05-09 IMAGING — CT CT MAXILLOFACIAL W/ CM
3 series · 16 of 47 positions shown, 19 images · IV contrast (agent unspecified)
Comparison: MRI brain 04/25/2017.

CLINICAL DATA: Sudden facial swelling.

EXAM:
CT MAXILLOFACIAL WITH CONTRAST
TECHNIQUE: Multidetector CT imaging of the maxillofacial structures was
performed. Multiplanar CT image reconstructions were also generated.
A small metallic BB was placed on the right temple in order to
reliably differentiate right from left.

[Series 3: facial/orbits w 2.0 st · axial · 0.35mm/px · z∈[+884,+1018]mm · 10 of 79 slices shown, 13 images]
[im 6/79  brain]
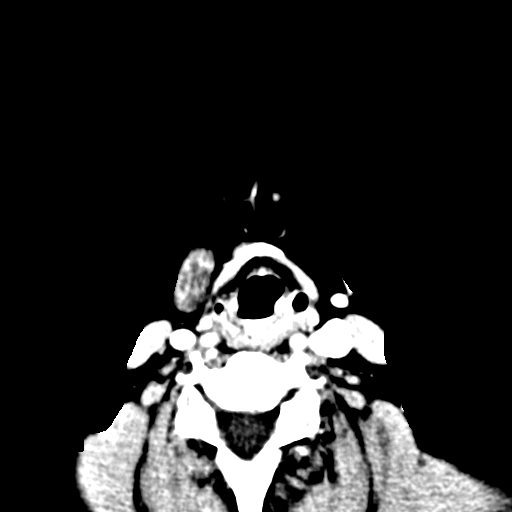
[im 6/79  bone]
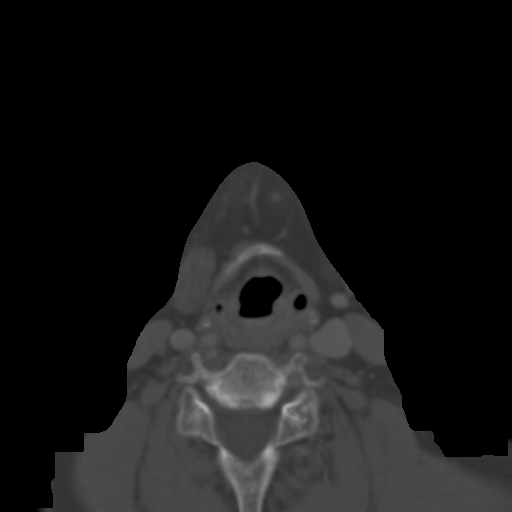
[im 14/79  bone]
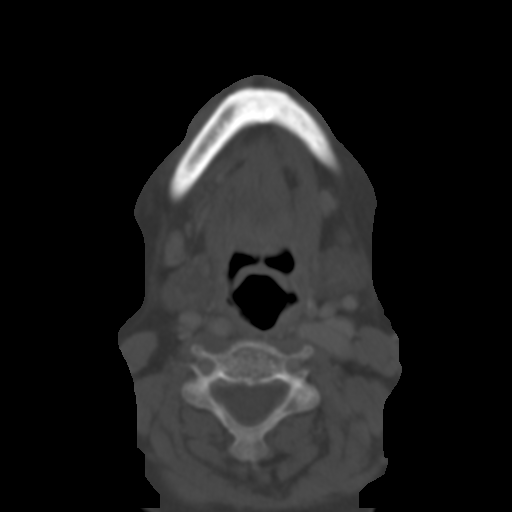
[im 22/79  bone]
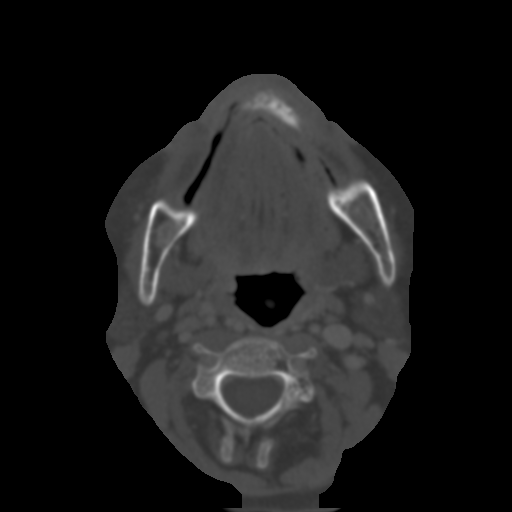
[im 27/79  bone]
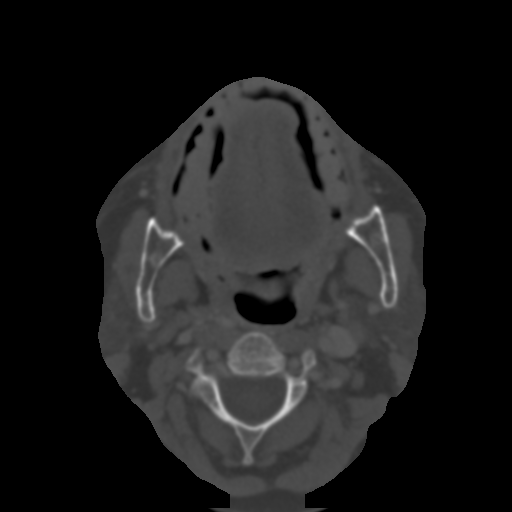
[im 35/79  brain]
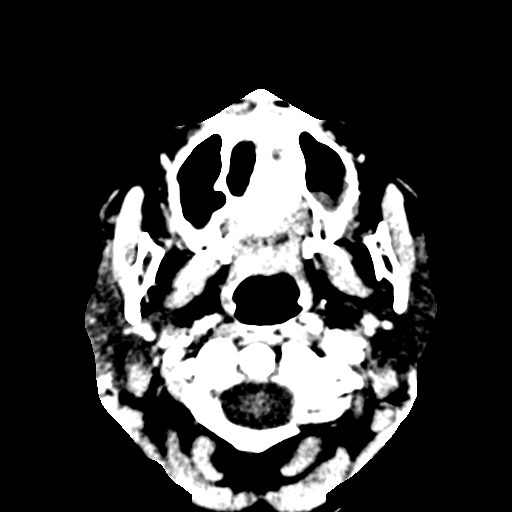
[im 35/79  bone]
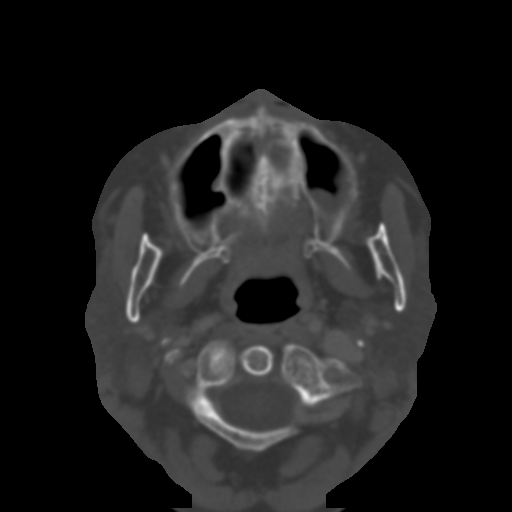
[im 44/79  bone]
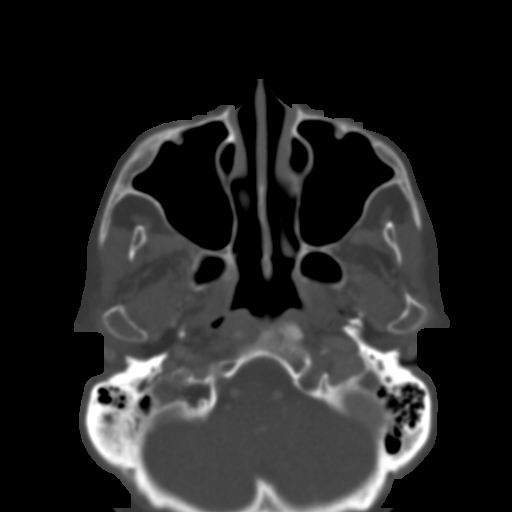
[im 52/79  bone]
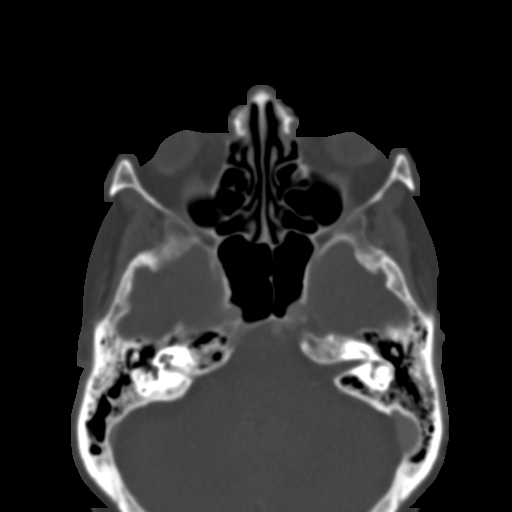
[im 60/79  bone]
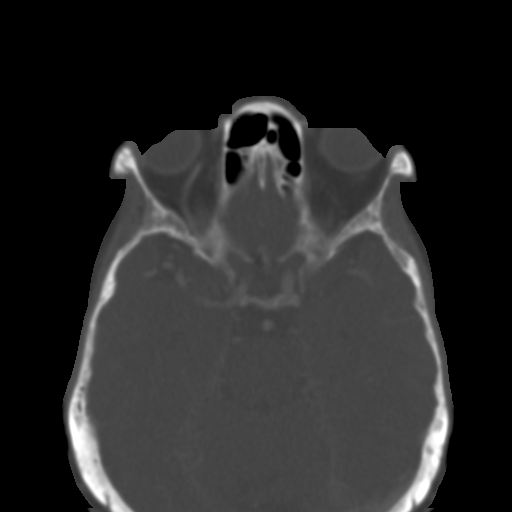
[im 65/79  brain]
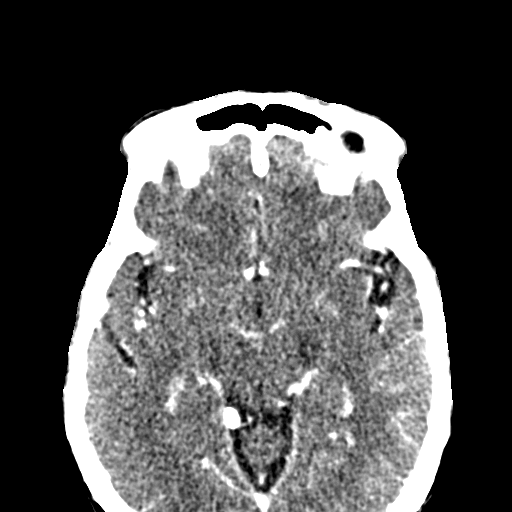
[im 65/79  bone]
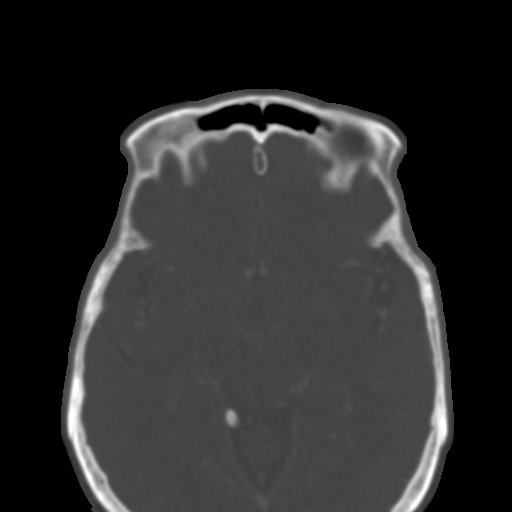
[im 73/79  bone]
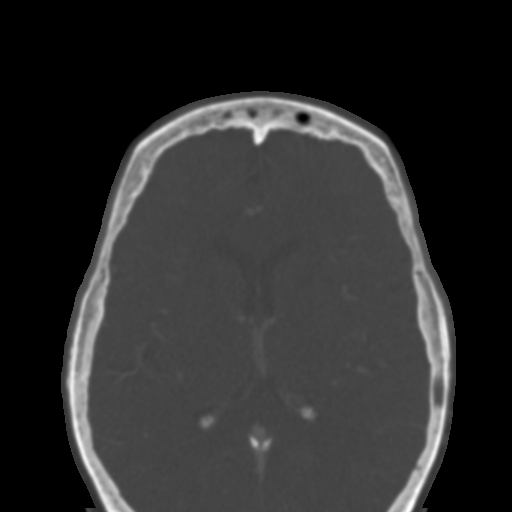

[Series 7: coronal soft tissue · coronal · 0.31mm/px · 3 of 81 slices shown]
[im 27/81  bone]
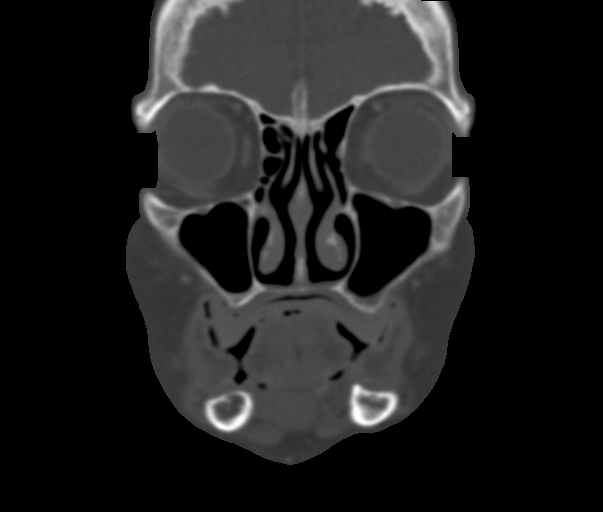
[im 36/81  bone]
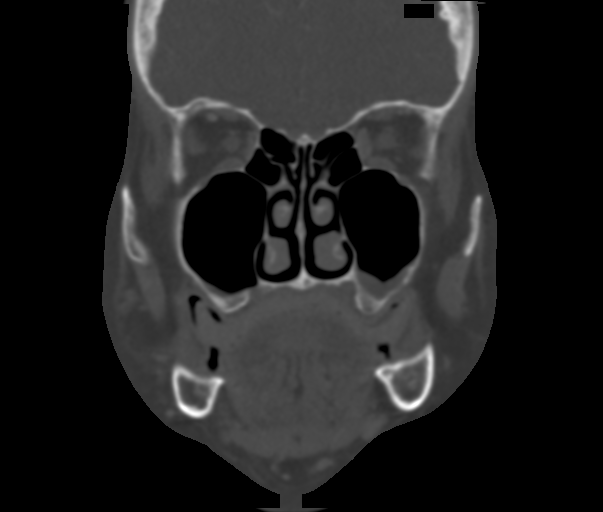
[im 45/81  bone]
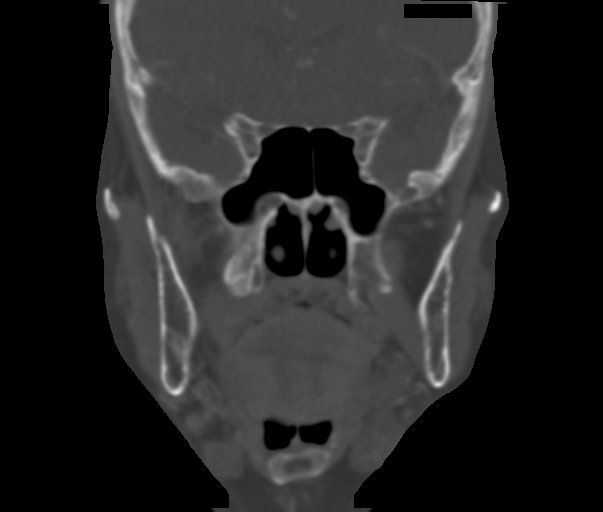

[Series 8: sagittal soft tissue · sagittal · 0.31mm/px · 3 of 81 slices shown]
[im 27/81  bone]
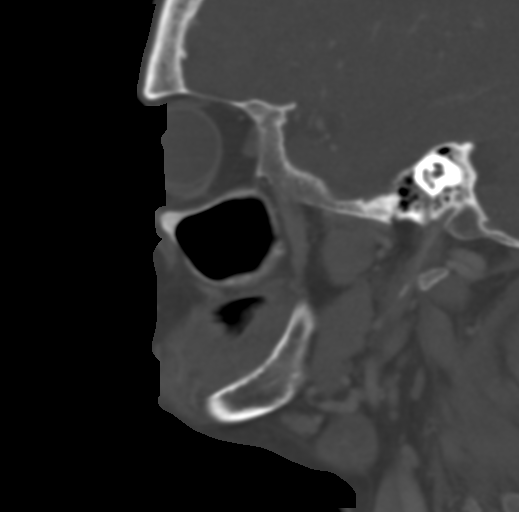
[im 41/81  bone]
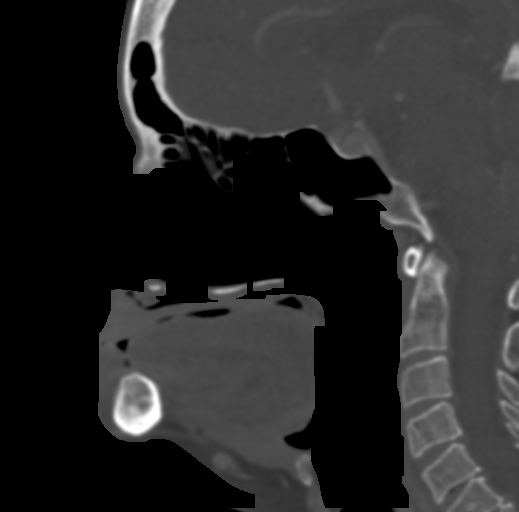
[im 54/81  bone]
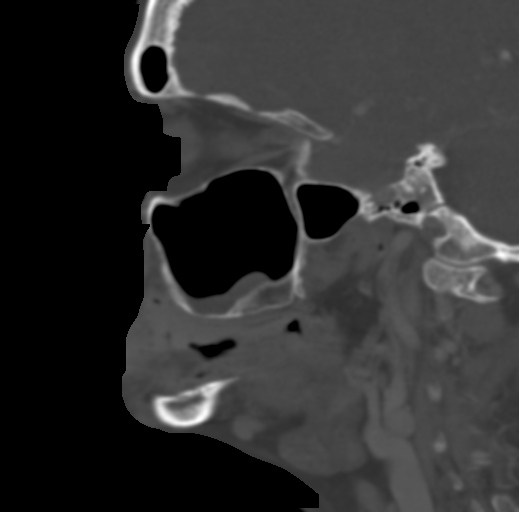

[16 of 47 positions shown; findings below may reference images not displayed]

FINDINGS: Osseous: No fracture or mandibular dislocation. No destructive
process.

Orbits: Negative. No traumatic or inflammatory finding.

Sinuses: Mild mucosal thickening in the maxillary and ethmoid
sinuses. No significant opacity or layering fluid.

Soft tissues: No evidence for significant cellulitis or subcutaneous
edema or air.

Limited intracranial: Unremarkable.
IMPRESSION: No evidence for significant facial cellulitis, subcutaneous edema,
tear, or other significant process. No underlying osseous
abnormality.

## 2018-05-13 IMAGING — CR DG CHEST 2V
2 series · 2 of 2 positions shown · non-contrast
Comparison: Chest radiograph 04/23/2017

CLINICAL DATA: Shortness of breath and headache

EXAM:
CHEST  2 VIEW

[chest lat]
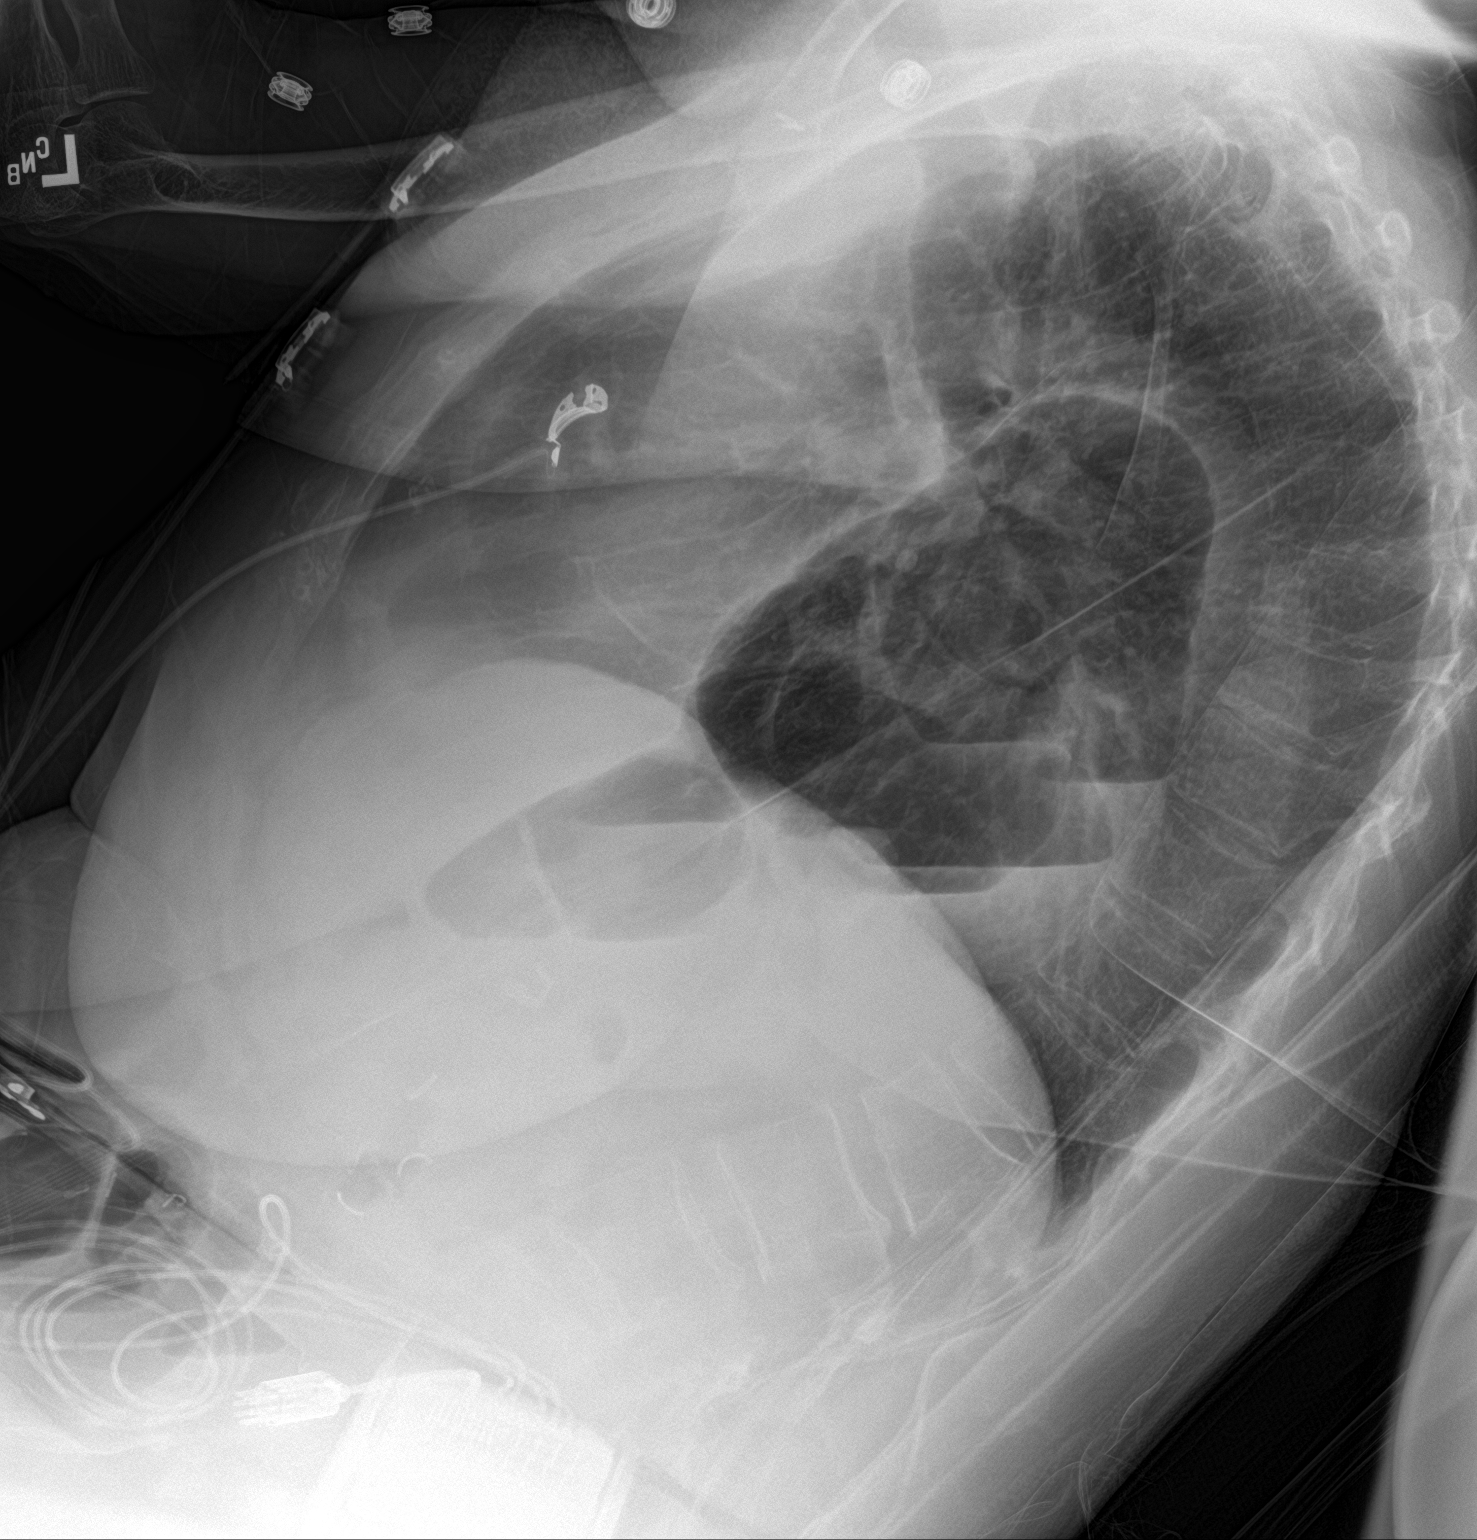

[chest ap]
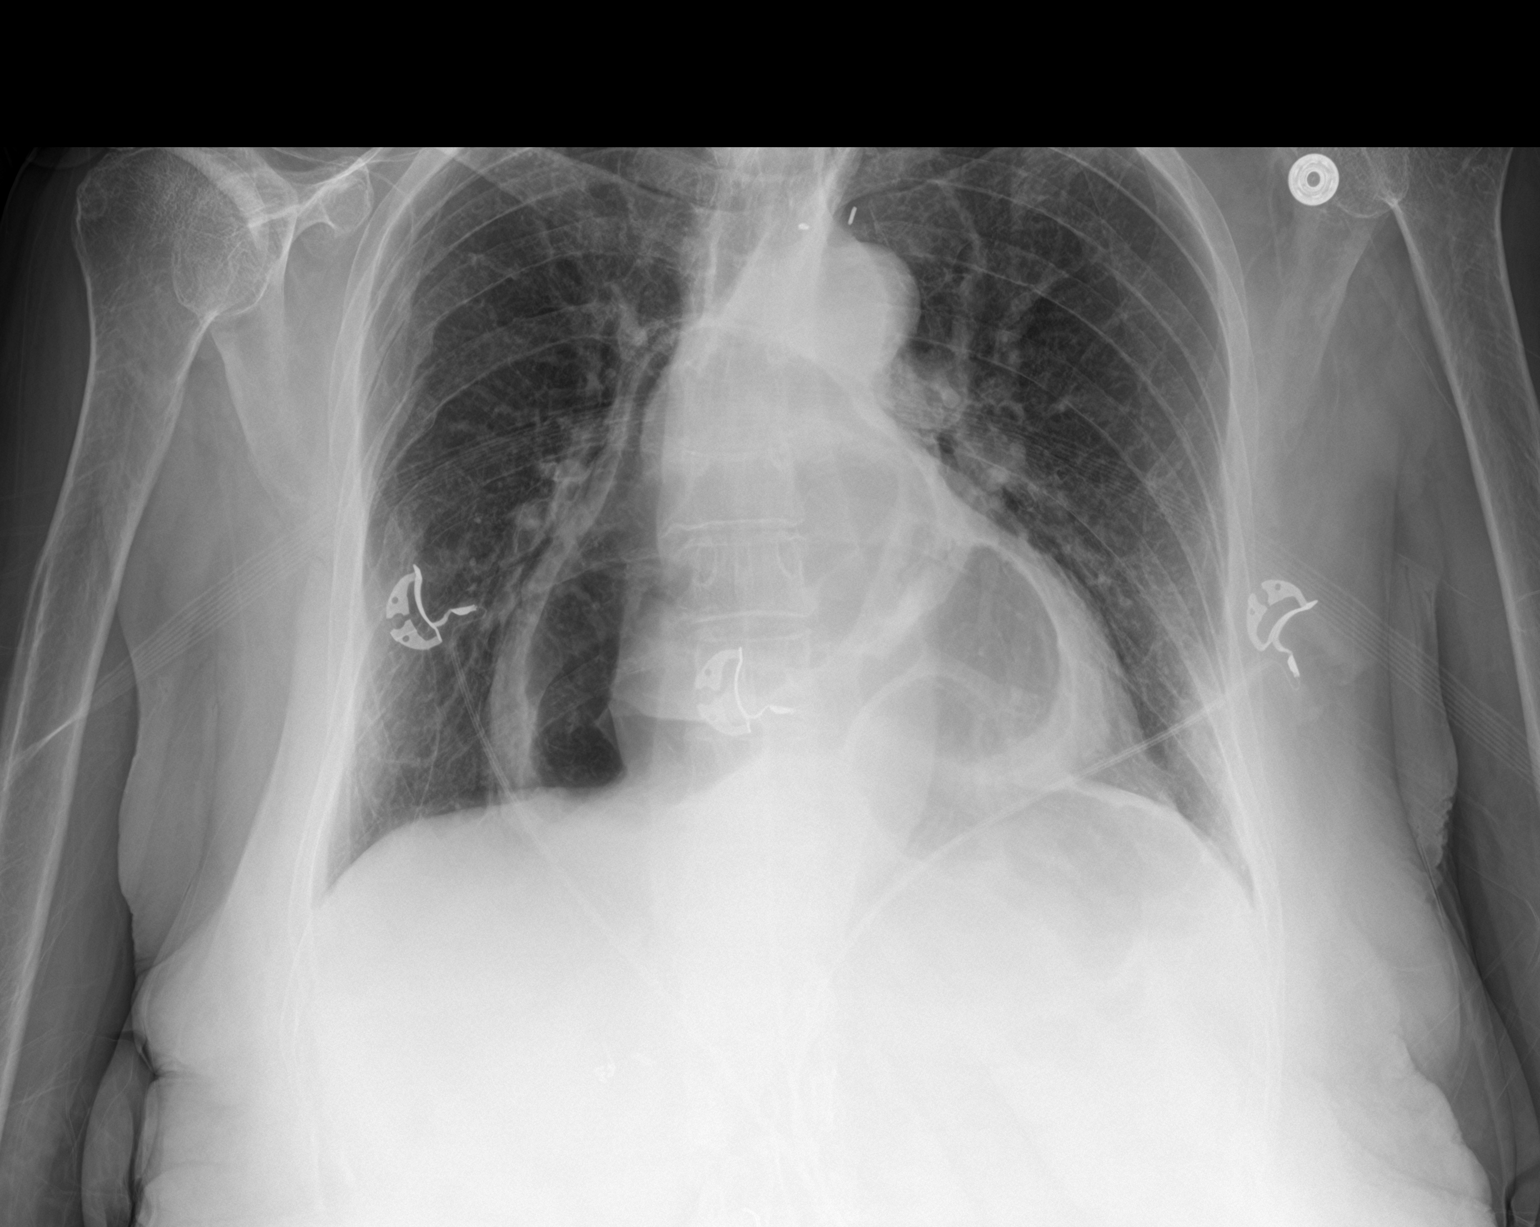

[2 of 2 positions shown; findings below may reference images not displayed]

FINDINGS: There is a massive hiatal hernia, unchanged. The cardiac silhouette
is unchanged and within normal limits for size. No focal
consolidation or pulmonary edema. No pneumothorax or pleural
effusion.
IMPRESSION: Unchanged massive hiatal hernia.  No focal airspace disease.

## 2018-05-20 IMAGING — CR DG CHEST 2V
2 series · 2 of 2 positions shown · non-contrast
Comparison: 04/29/2017 and earlier.

CLINICAL DATA: 70-year-old female with generalized weakness for 5
days. Cough.

EXAM:
CHEST  2 VIEW

[w chest lat]
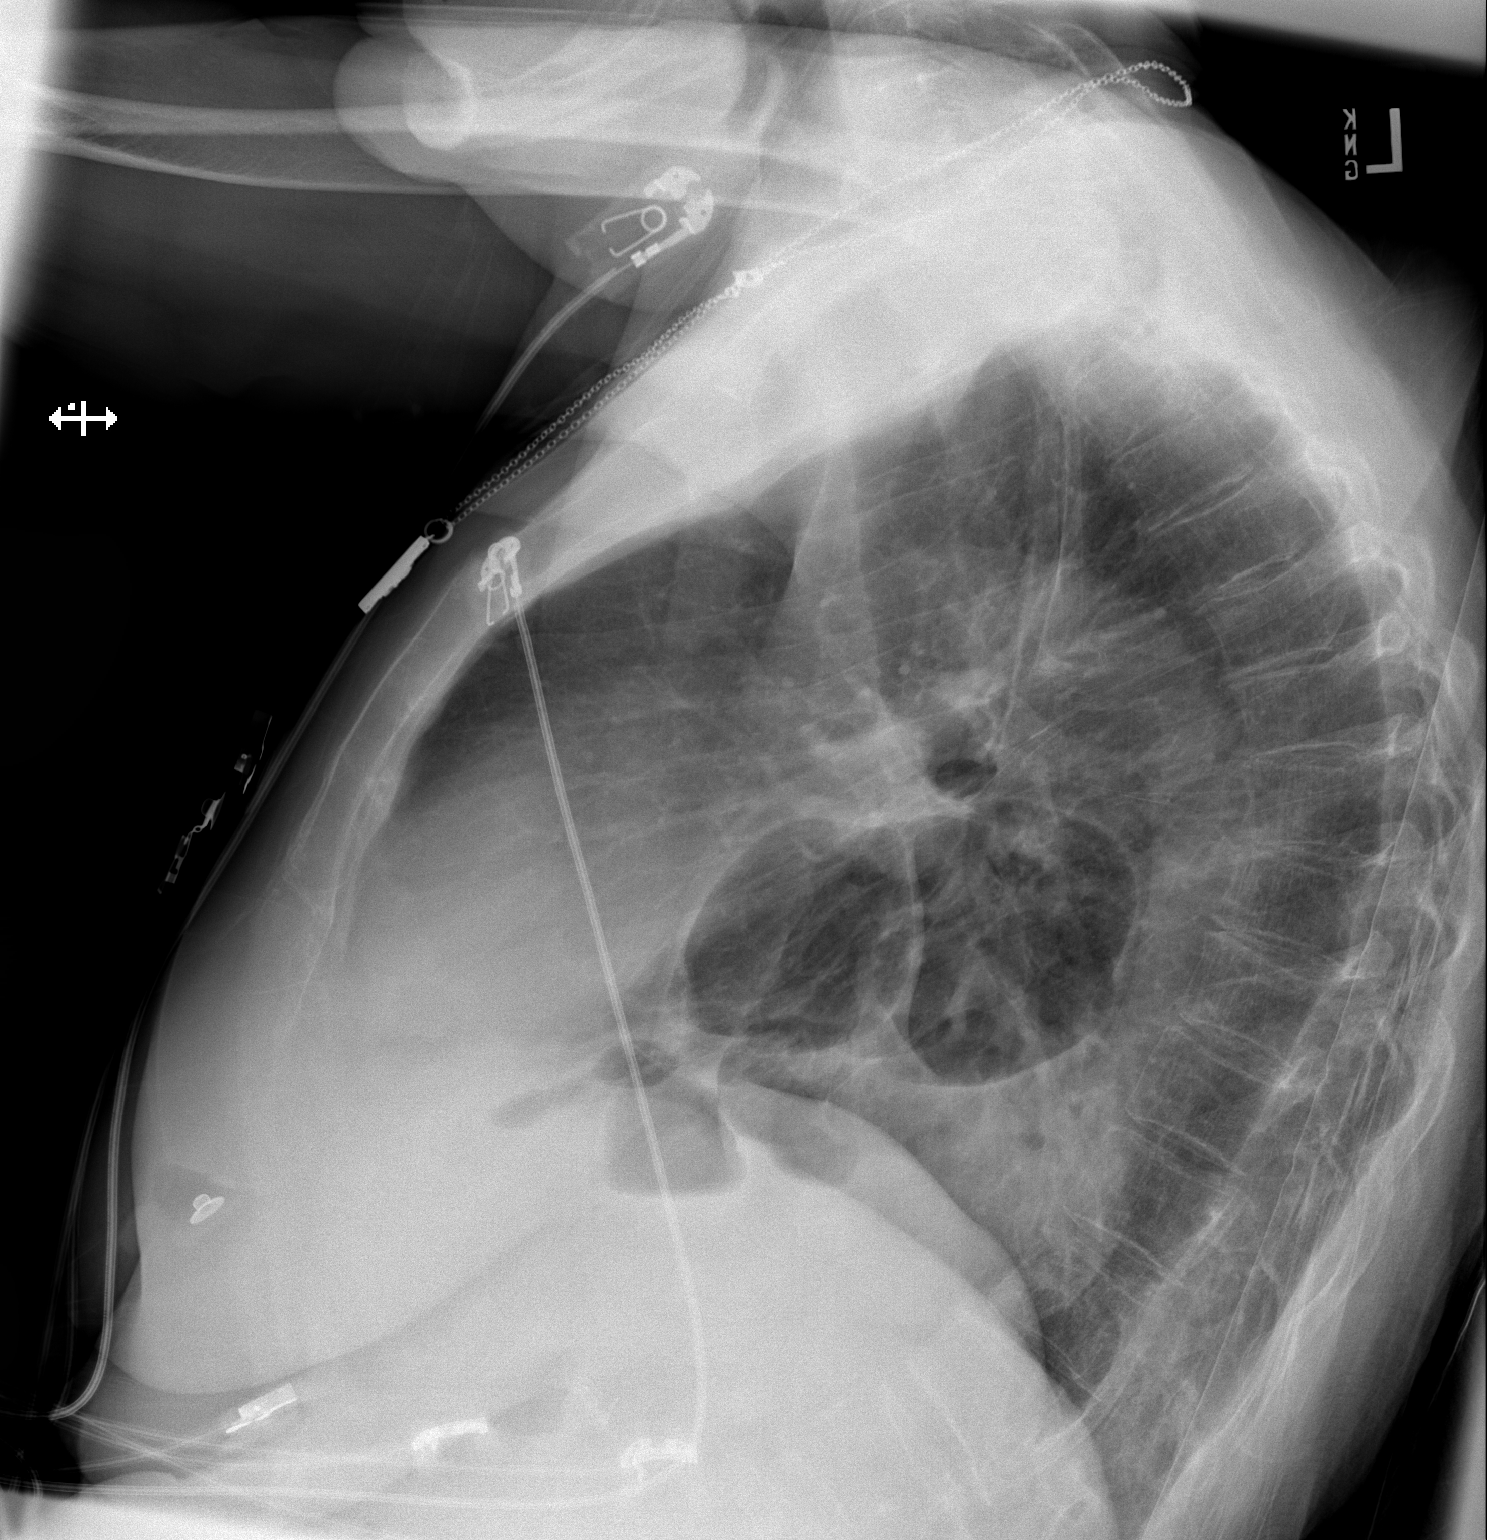

[x chest ap]
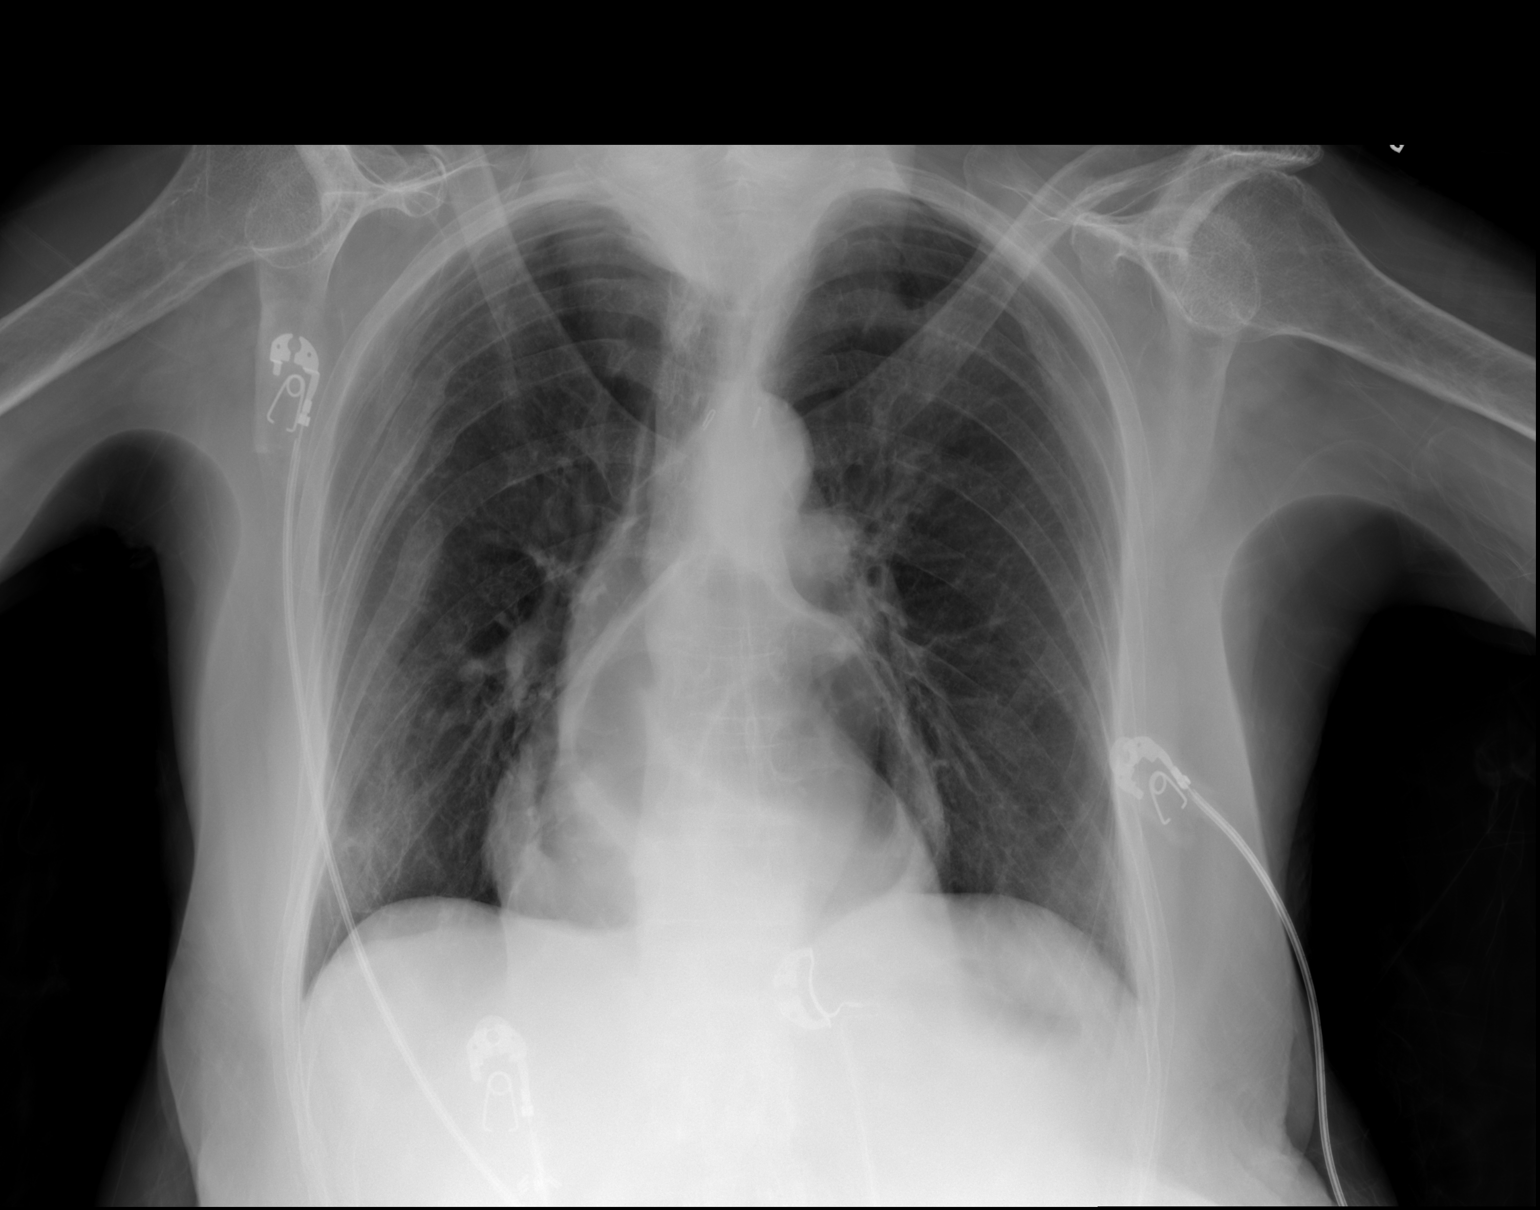

[2 of 2 positions shown; findings below may reference images not displayed]

FINDINGS: Semi upright AP and lateral views of the chest. Large chronic
gastric containing hiatal hernia. Stable cardiac size and
mediastinal contours. Stable lung volumes. No pneumothorax,
pulmonary edema, pleural effusion or confluent pulmonary opacity.
Chronic right lateral rib fractures. Osteopenia. No acute osseous
abnormality identified. Negative visible bowel gas pattern in the
abdomen.
IMPRESSION: 1.  No acute cardiopulmonary abnormality.
2. Chronic large hiatal hernia with intrathoracic stomach.

## 2018-05-21 IMAGING — DX DG CHEST 1V PORT
1 series · 1 of 1 positions shown · non-contrast
Comparison: Yesterday

CLINICAL DATA: Shortness of breath.

EXAM:
PORTABLE CHEST 1 VIEW

[chest ap]
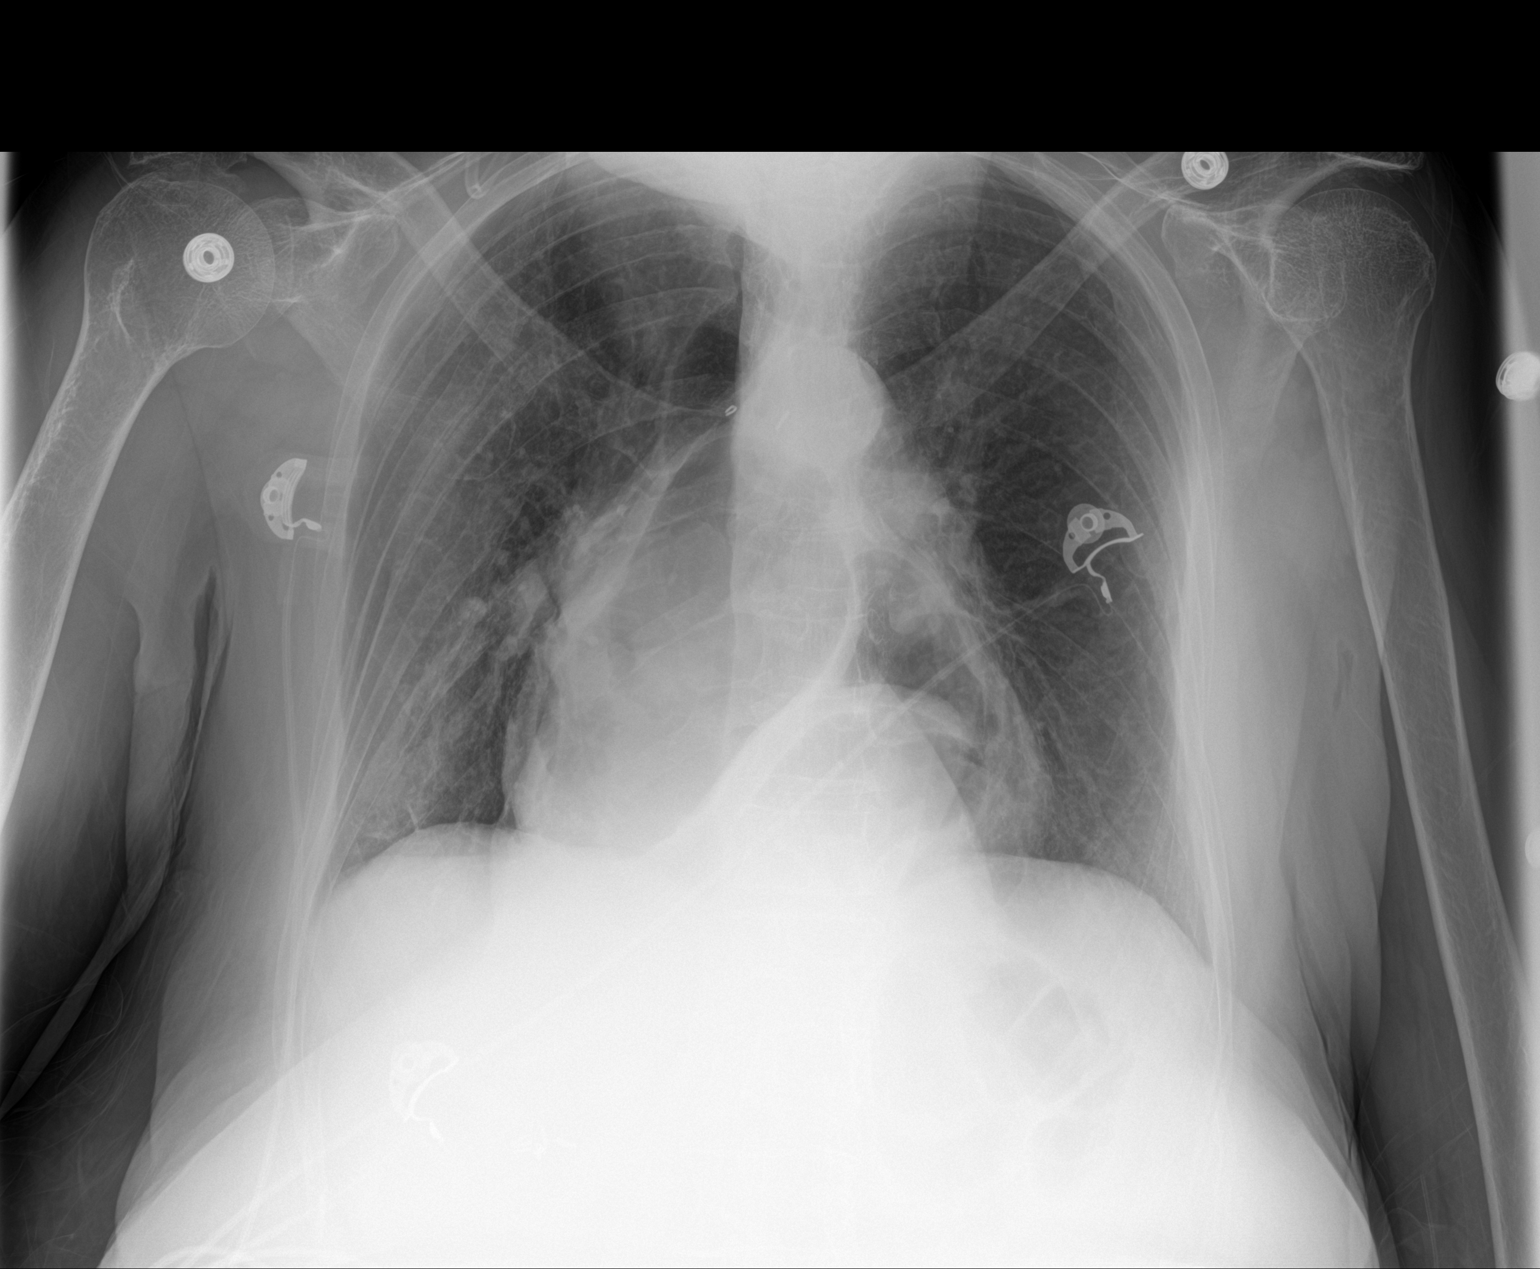

[1 of 1 positions shown; findings below may reference images not displayed]

FINDINGS: Chronic cardiomegaly and gas distended intrathoracic stomach and
esophagus. There is no edema, consolidation, effusion, or
pneumothorax. No acute osseous finding.
IMPRESSION: 1. No acute finding.
2. Chronic gaseous distention of intrathoracic stomach.

## 2018-05-22 IMAGING — CT CT CHEST W/O CM
2 of 3 series · 15 of 36 positions shown, 18 images · non-contrast
Comparison: Abdominal CT 02/03/2017

CLINICAL DATA: Dyspnea.  Suspected sepsis.

EXAM:
CT CHEST WITHOUT CONTRAST
TECHNIQUE: Multidetector CT imaging of the chest was performed following the
standard protocol without IV contrast.

[Series 3: thorax · axial · 0.56mm/px · z∈[-180,+48]mm · 12 of 134 slices shown, 15 images]
[im 10/134  mediastinal]
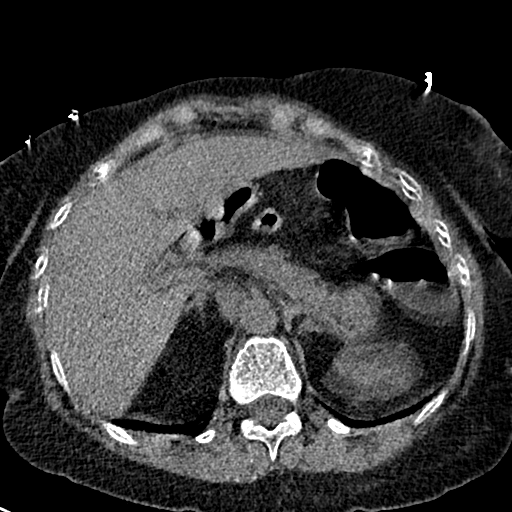
[im 10/134  lung]
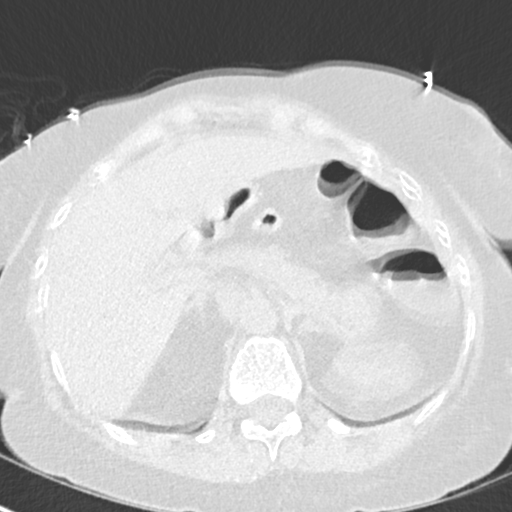
[im 20/134  lung]
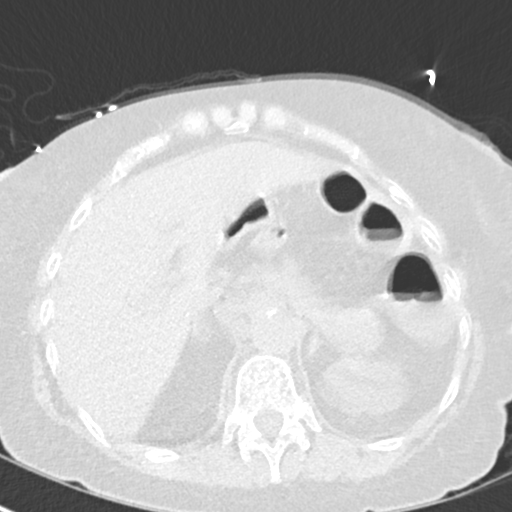
[im 30/134  lung]
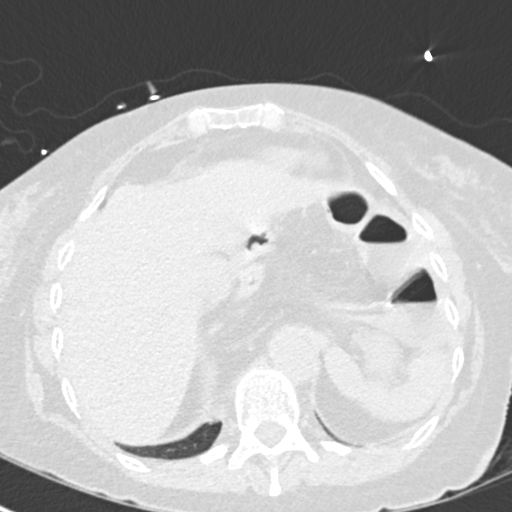
[im 40/134  lung]
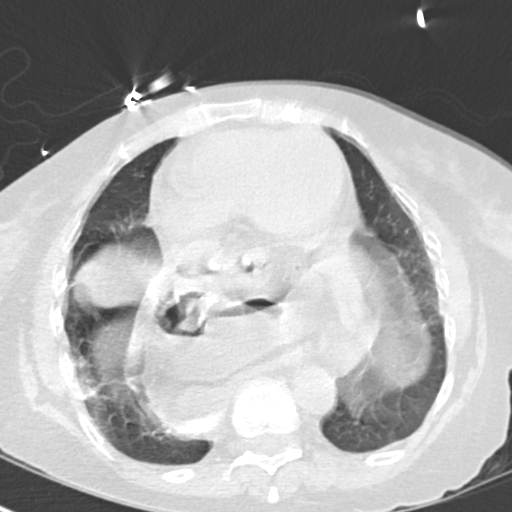
[im 50/134  mediastinal]
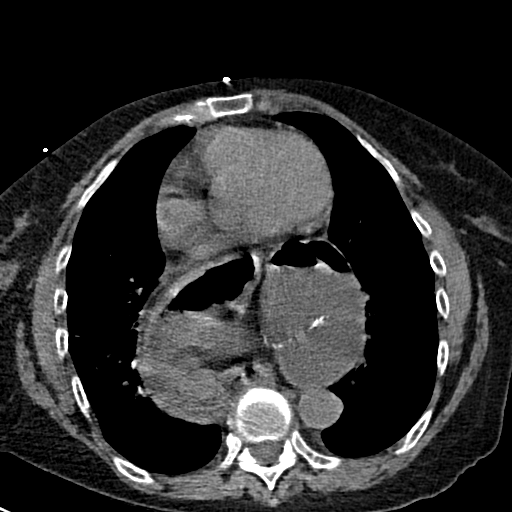
[im 50/134  lung]
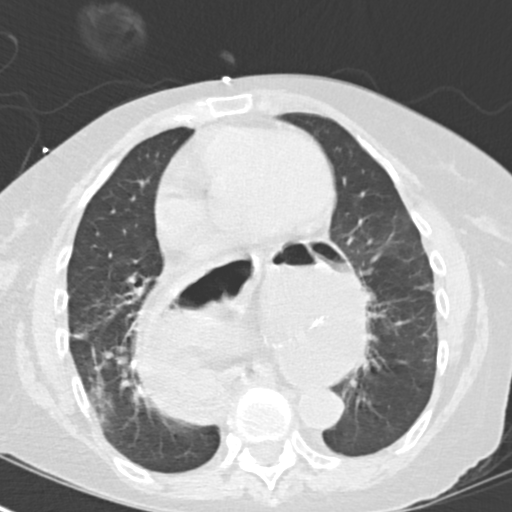
[im 60/134  lung]
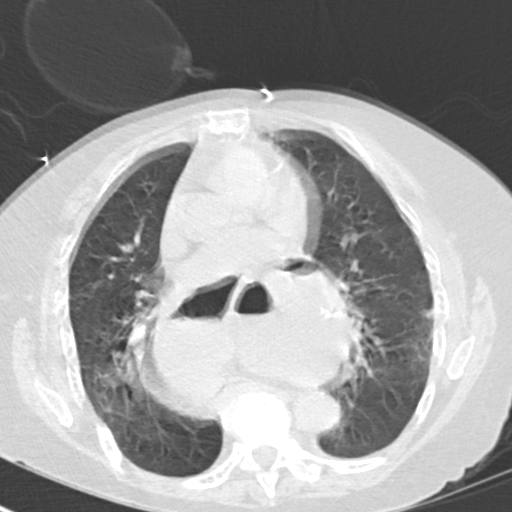
[im 74/134  lung]
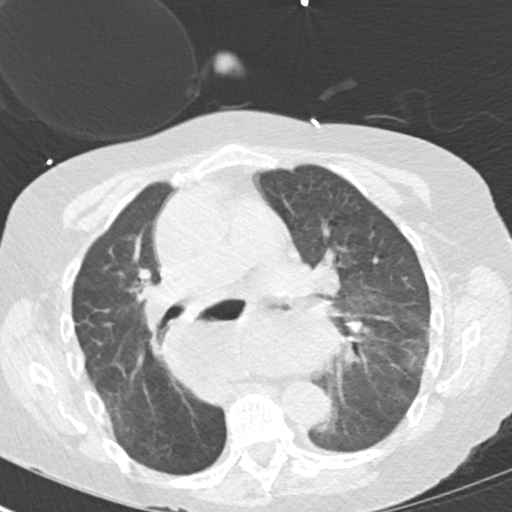
[im 84/134  lung]
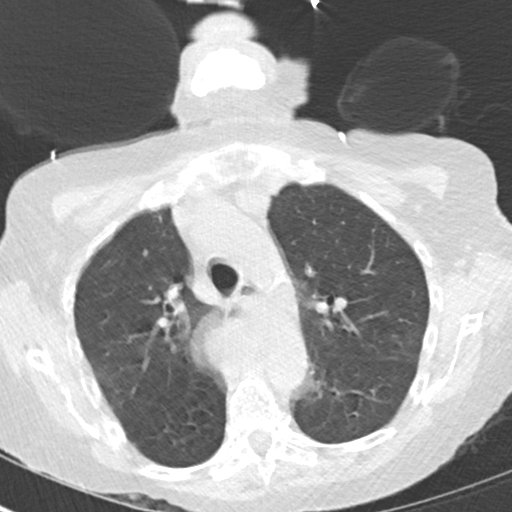
[im 94/134  mediastinal]
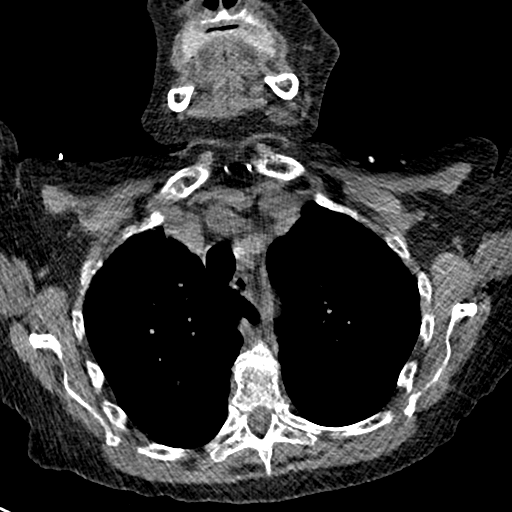
[im 94/134  lung]
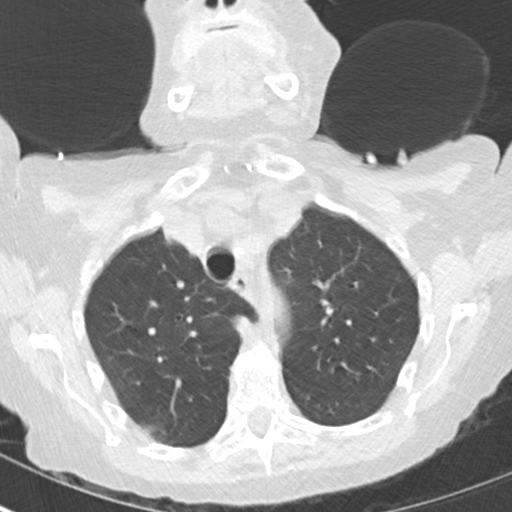
[im 104/134  lung]
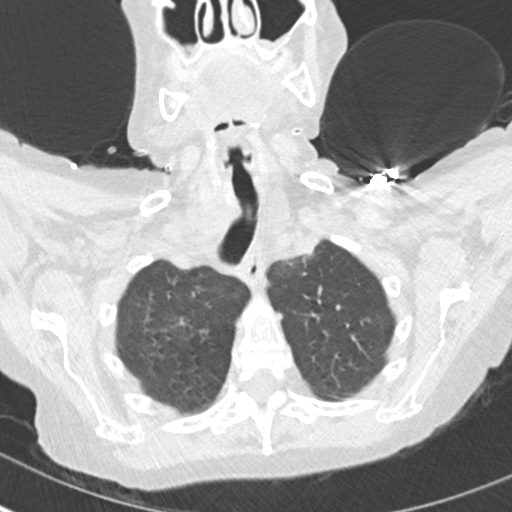
[im 114/134  lung]
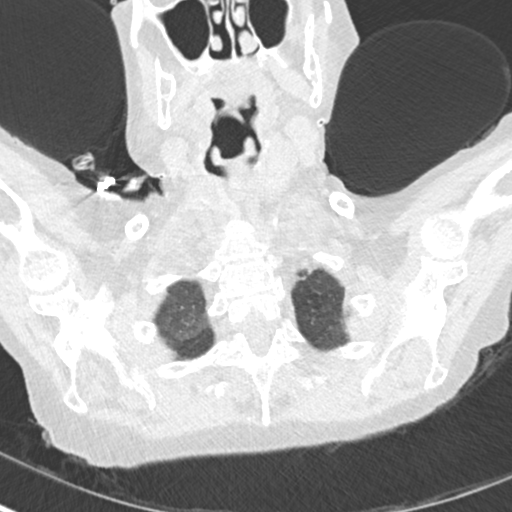
[im 124/134  lung]
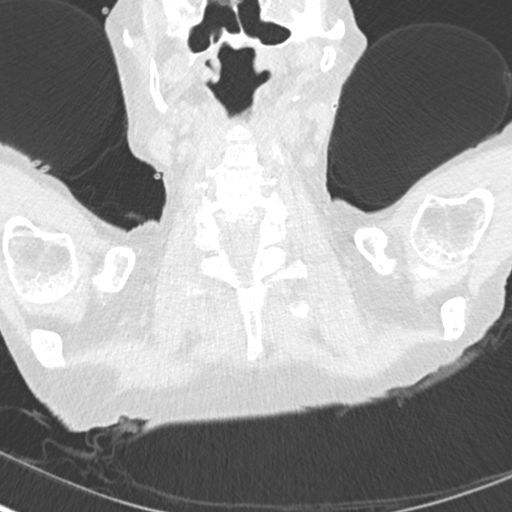

[Series 7: coronal · coronal · 0.54mm/px · 3 of 118 slices shown]
[im 24/118  lung]
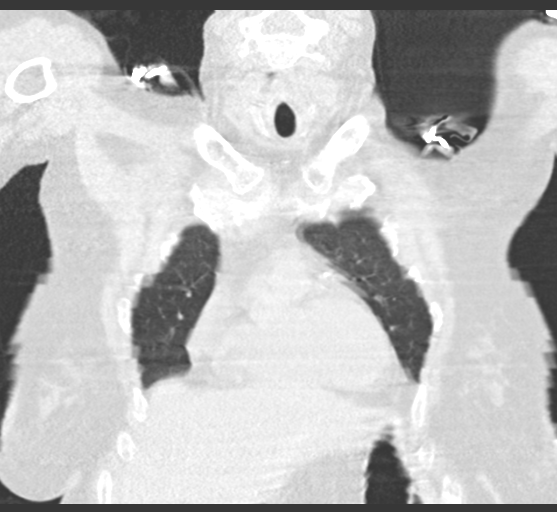
[im 47/118  lung]
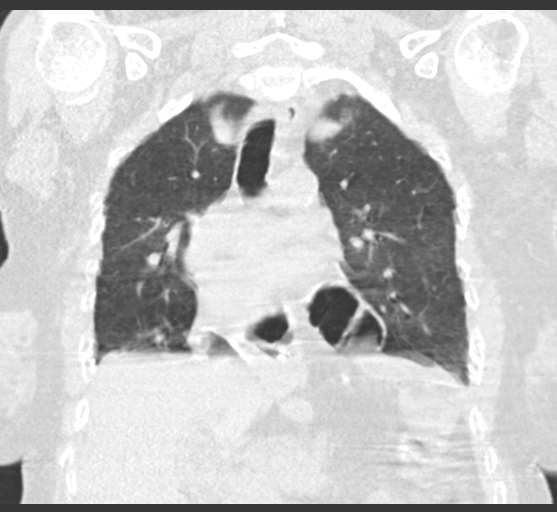
[im 71/118  lung]
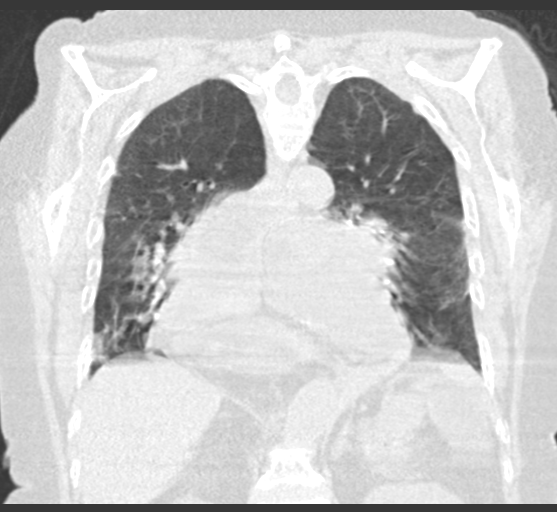

[15 of 36 positions shown; findings below may reference images not displayed]

FINDINGS: Cardiovascular: The heart is displaced anteriorly. No cardiomegaly
or pericardial effusion. LAD atherosclerotic calcification. No acute
finding in the great vessels.

Mediastinum/Nodes: Large hiatal hernia with intrathoracic stomach
and herniation of small bowel loops at an aneurysmal
enteroenterostomy. This degree of herniation has been seen since at
least 8650 chest CT. No pneumomediastinum. No adenopathy or mass.

Lungs/Pleura: Motion degraded evaluation. Minimal subpleural
atelectasis or scarring. There is no edema, consolidation, effusion,
or pneumothorax.

Upper Abdomen: No acute finding.  Cholecystectomy clips.

Musculoskeletal: No acute finding. Exaggerated thoracic kyphosis.
Remote L1 inferior endplate deformity.
IMPRESSION: 1. No acute finding.  Negative for pneumonia.
2. Chronic large hiatal hernia containing stomach and small bowel.

## 2018-06-22 IMAGING — CR DG ABDOMEN ACUTE W/ 1V CHEST
3 series · 3 of 3 positions shown · non-contrast
Comparison: Chest CT from 05/08/2017

CLINICAL DATA: Nausea, vomiting and diarrhea

EXAM:
DG ABDOMEN ACUTE W/ 1V CHEST

[x chest ap]
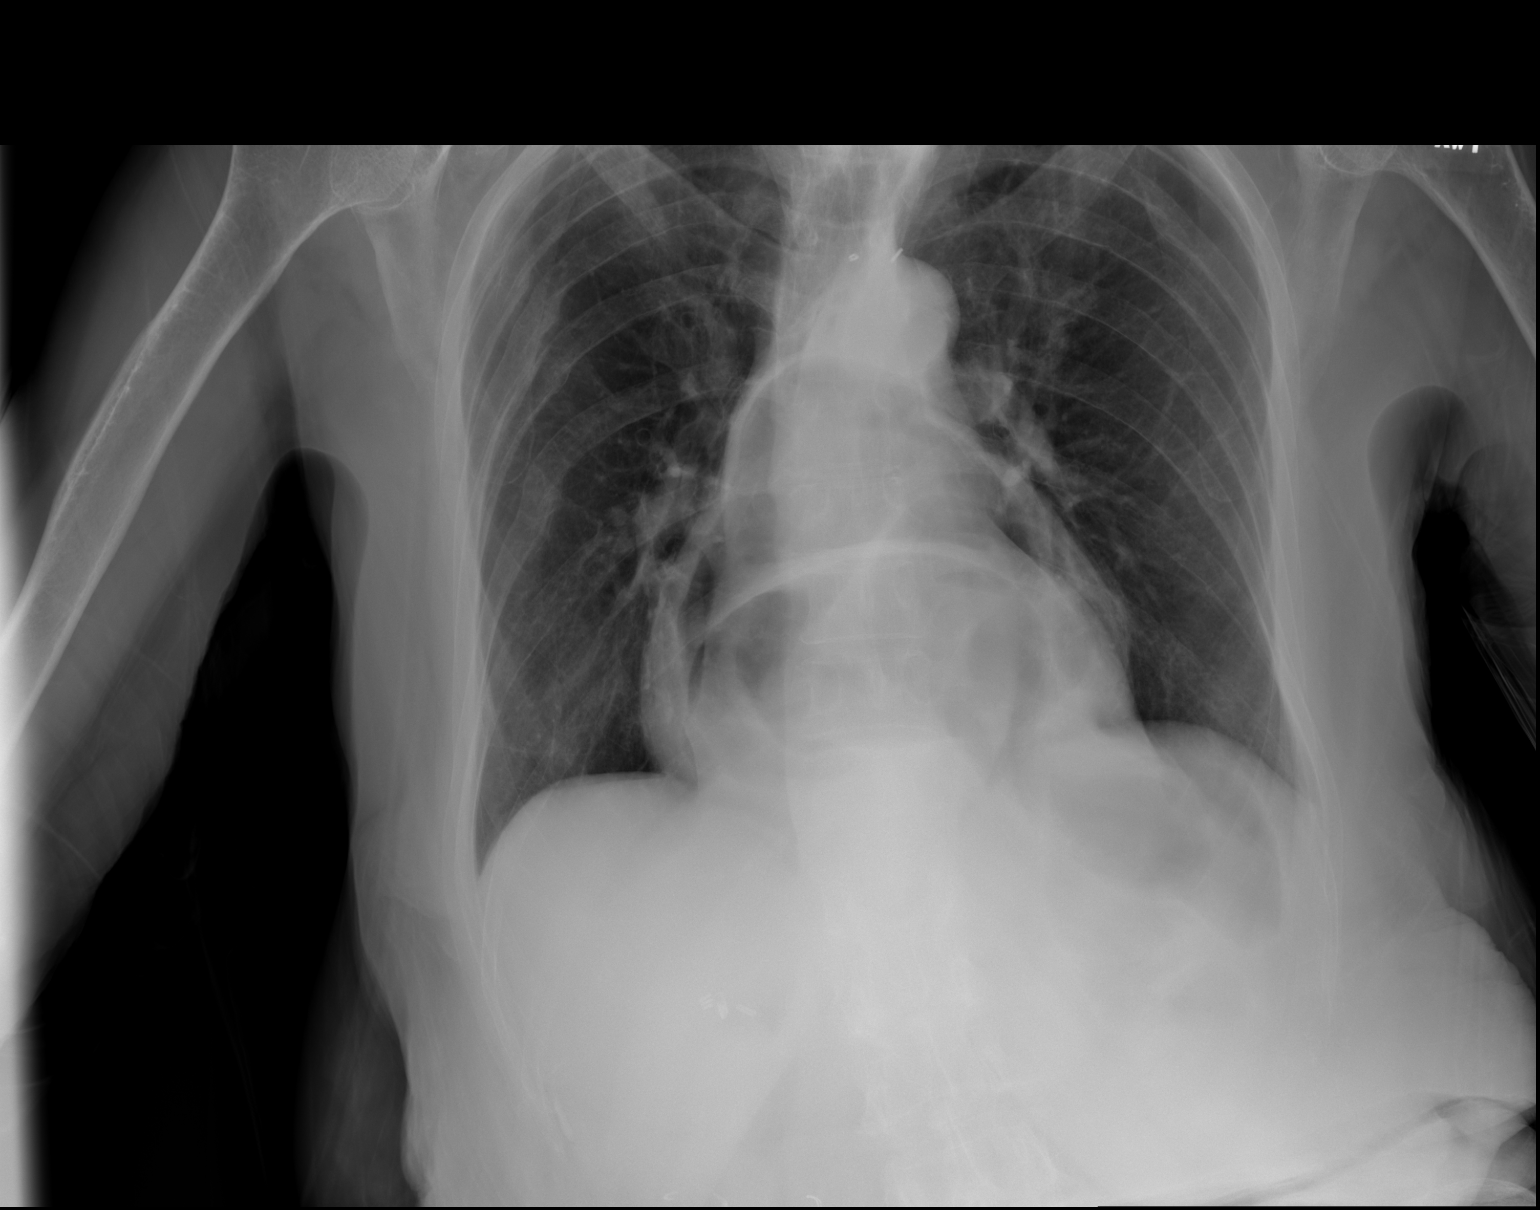

[w abdomen upright]
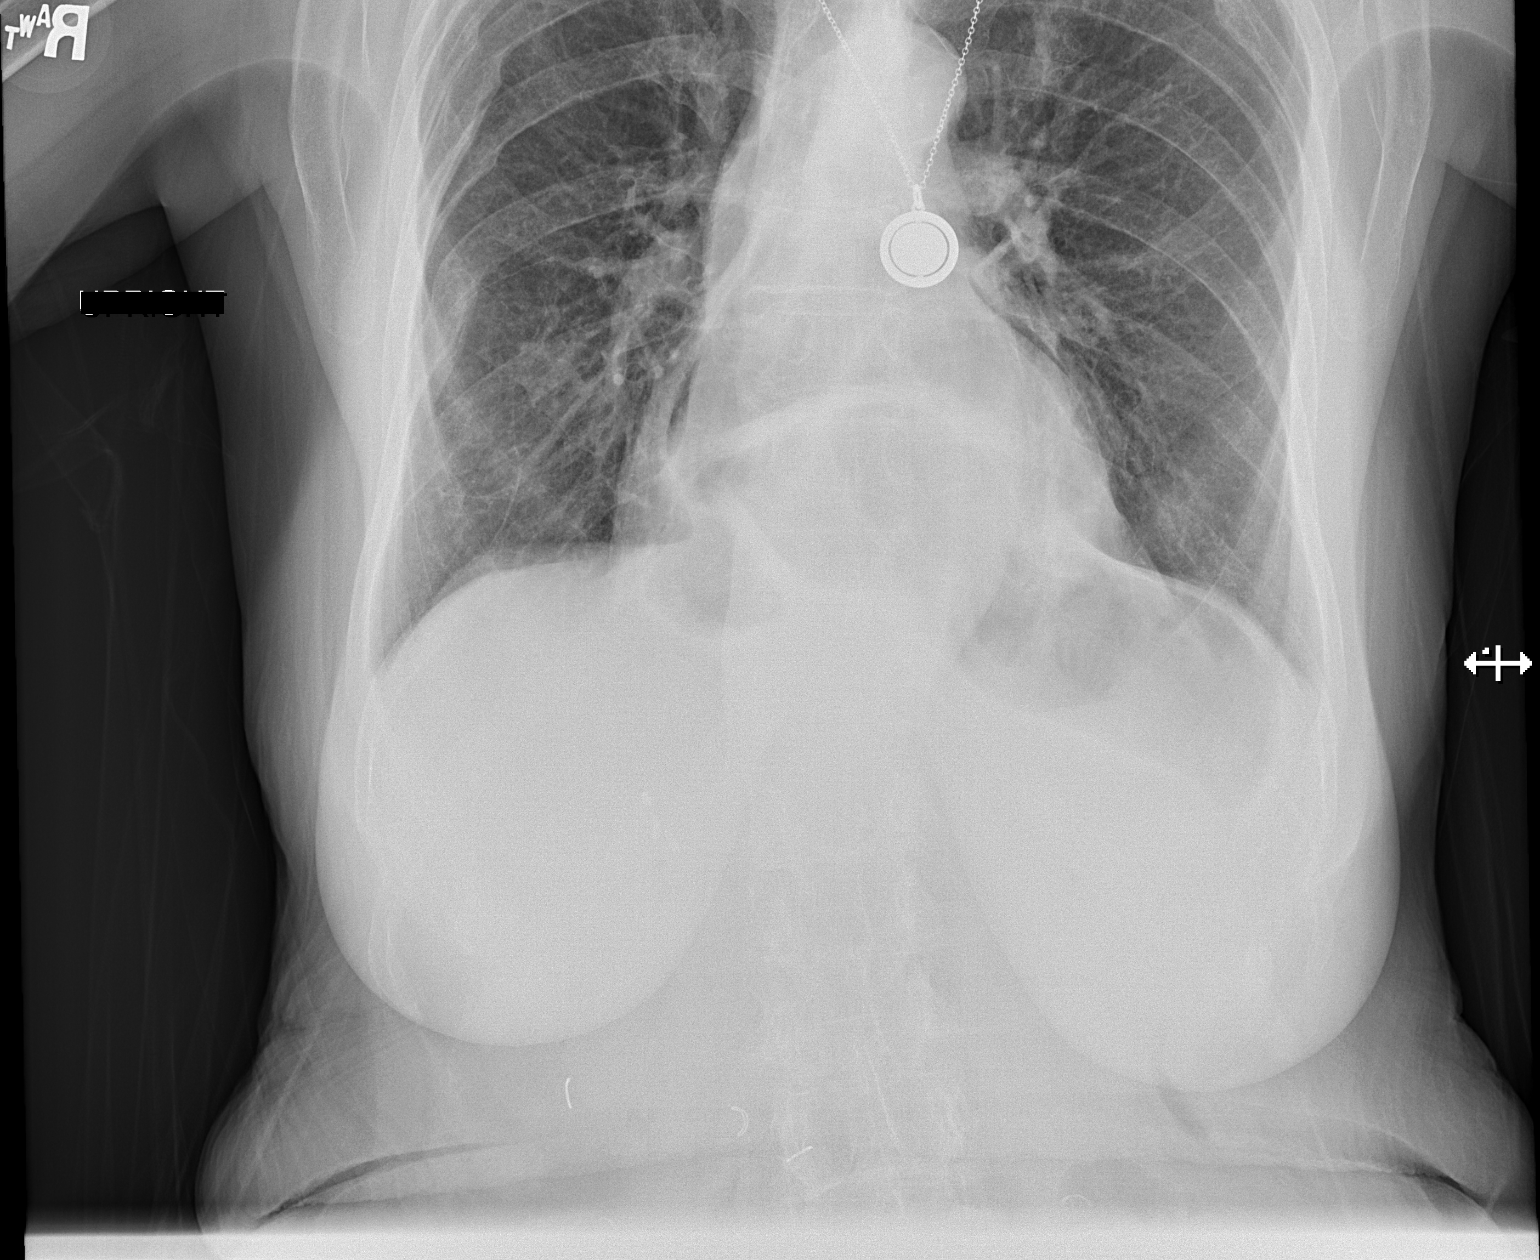

[x abdomen supine]
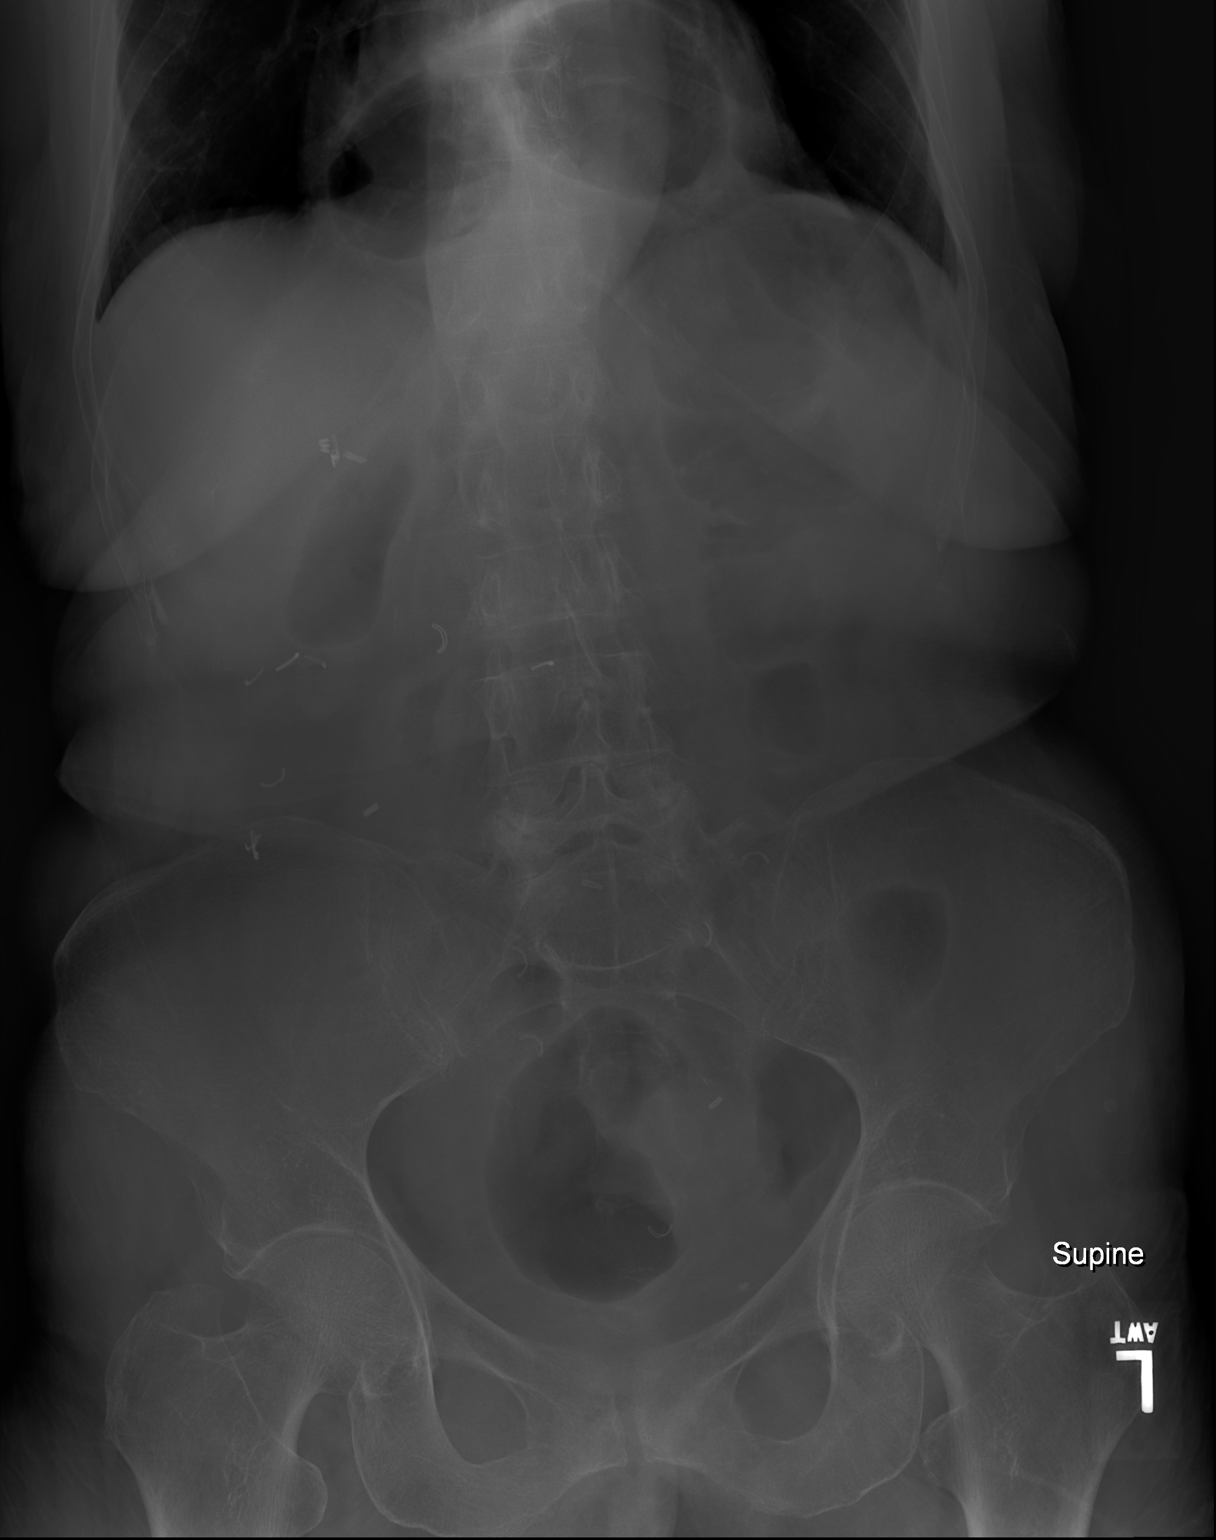

[3 of 3 positions shown; findings below may reference images not displayed]

FINDINGS: Previously noted herniation of stomach and portions of small bowel
are again identified projecting over the top normal size cardiac
silhouette. The lungs are clear. Old right-sided rib fractures
involving the right fourth through eighth ribs are noted. The
patient is status post cholecystectomy. The bowel gas pattern is
unremarkable. Several sutures are also seen in the right mid
abdomen. No free air or bowel obstruction is identified.
IMPRESSION: 1. Large hiatal hernia again noted projecting over the cardiac and
mediastinal contours.
2. No bowel obstruction.
3. No acute cardiopulmonary disease.

## 2018-06-24 IMAGING — RF DG ESOPHAGUS
2 series · 8 of 8 positions shown · non-contrast
Comparison: CT chest 05/08/2017

CLINICAL DATA: Dysphagia

EXAM:
ESOPHOGRAM/BARIUM SWALLOW
TECHNIQUE: Single contrast examination was performed using  thin barium.
FLUOROSCOPY TIME:  Fluoroscopy Time:  1 minutes 6 seconds
Radiation Exposure Index (if provided by the fluoroscopic device):
2.9 mGy
Number of Acquired Spot Images: 0

[Series 1: cp_standard · 0.36mm/px · 4 of 73 frames shown (1 of 2)]
[frame 11/73]
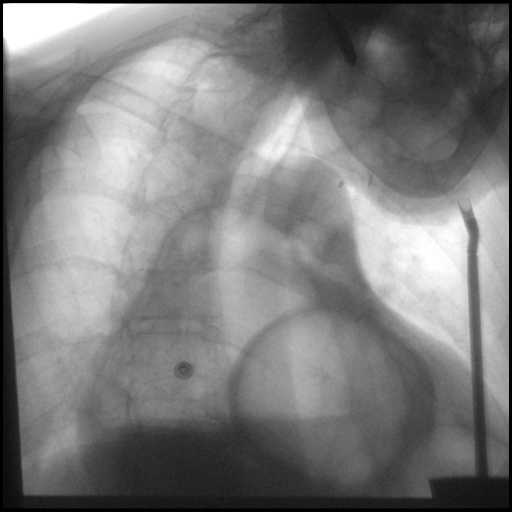
[frame 37/73]
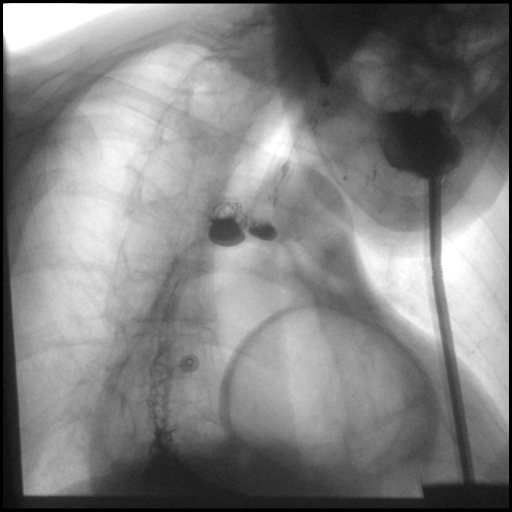
[frame 63/73]
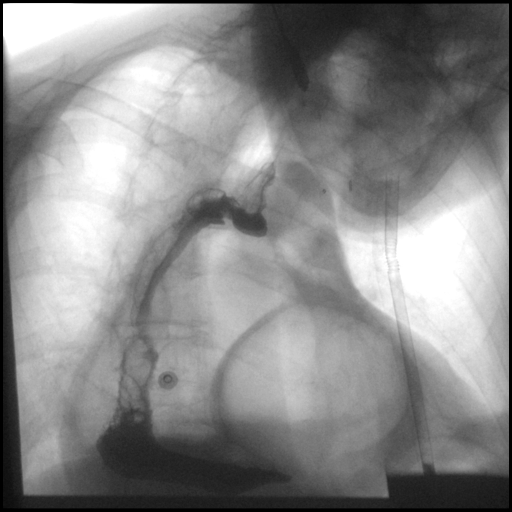
[frame 71/73]
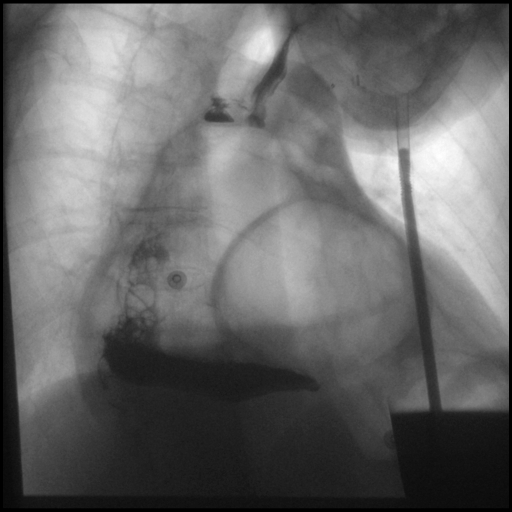

[Series 2: cp_standard · 0.36mm/px · 4 of 64 frames shown (2 of 2)]
[frame 10/64]
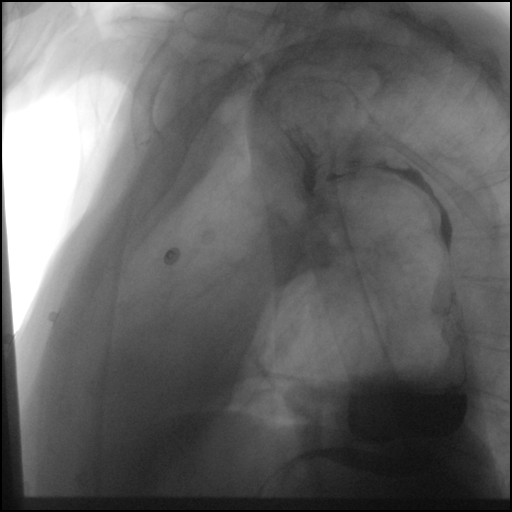
[frame 33/64]
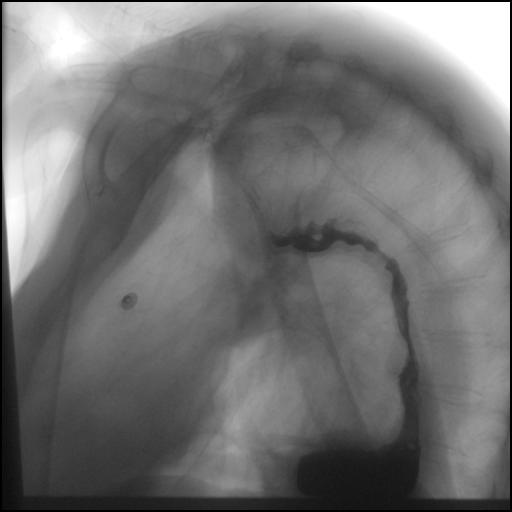
[frame 40/64]
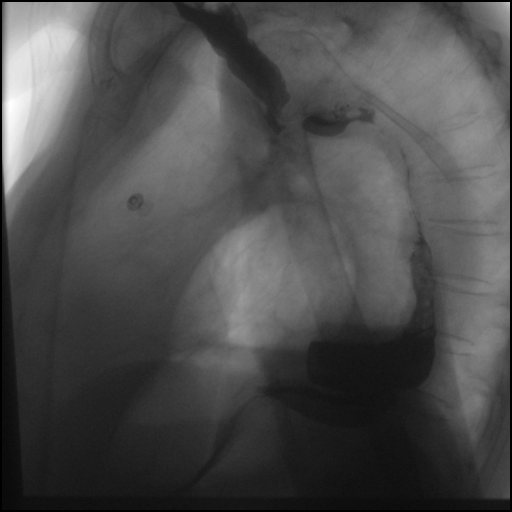
[frame 55/64]
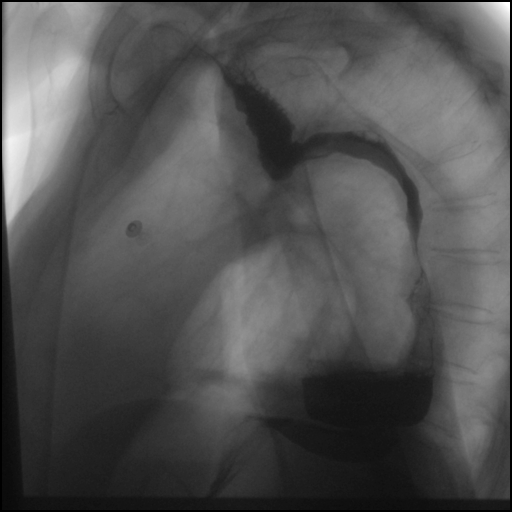

[8 of 8 positions shown; findings below may reference images not displayed]

FINDINGS: Limited study as the patient would only take small sips, min refused
to drink more prior to completing the study. There is a large hiatal
hernia. The esophagus deviates to the right around the large hiatal
hernia. There are no fixed esophageal strictures or visible masses.
IMPRESSION: Limited study as above. Deviation of the mid to distal esophagus
around a large hiatal hernia. No fixed strictures.

## 2018-06-26 IMAGING — DX DG CHEST 1V PORT
1 series · 1 of 1 positions shown · non-contrast
Comparison: 06/08/2017

CLINICAL DATA: Shortness of breath today

EXAM:
PORTABLE CHEST 1 VIEW

[chest ap]
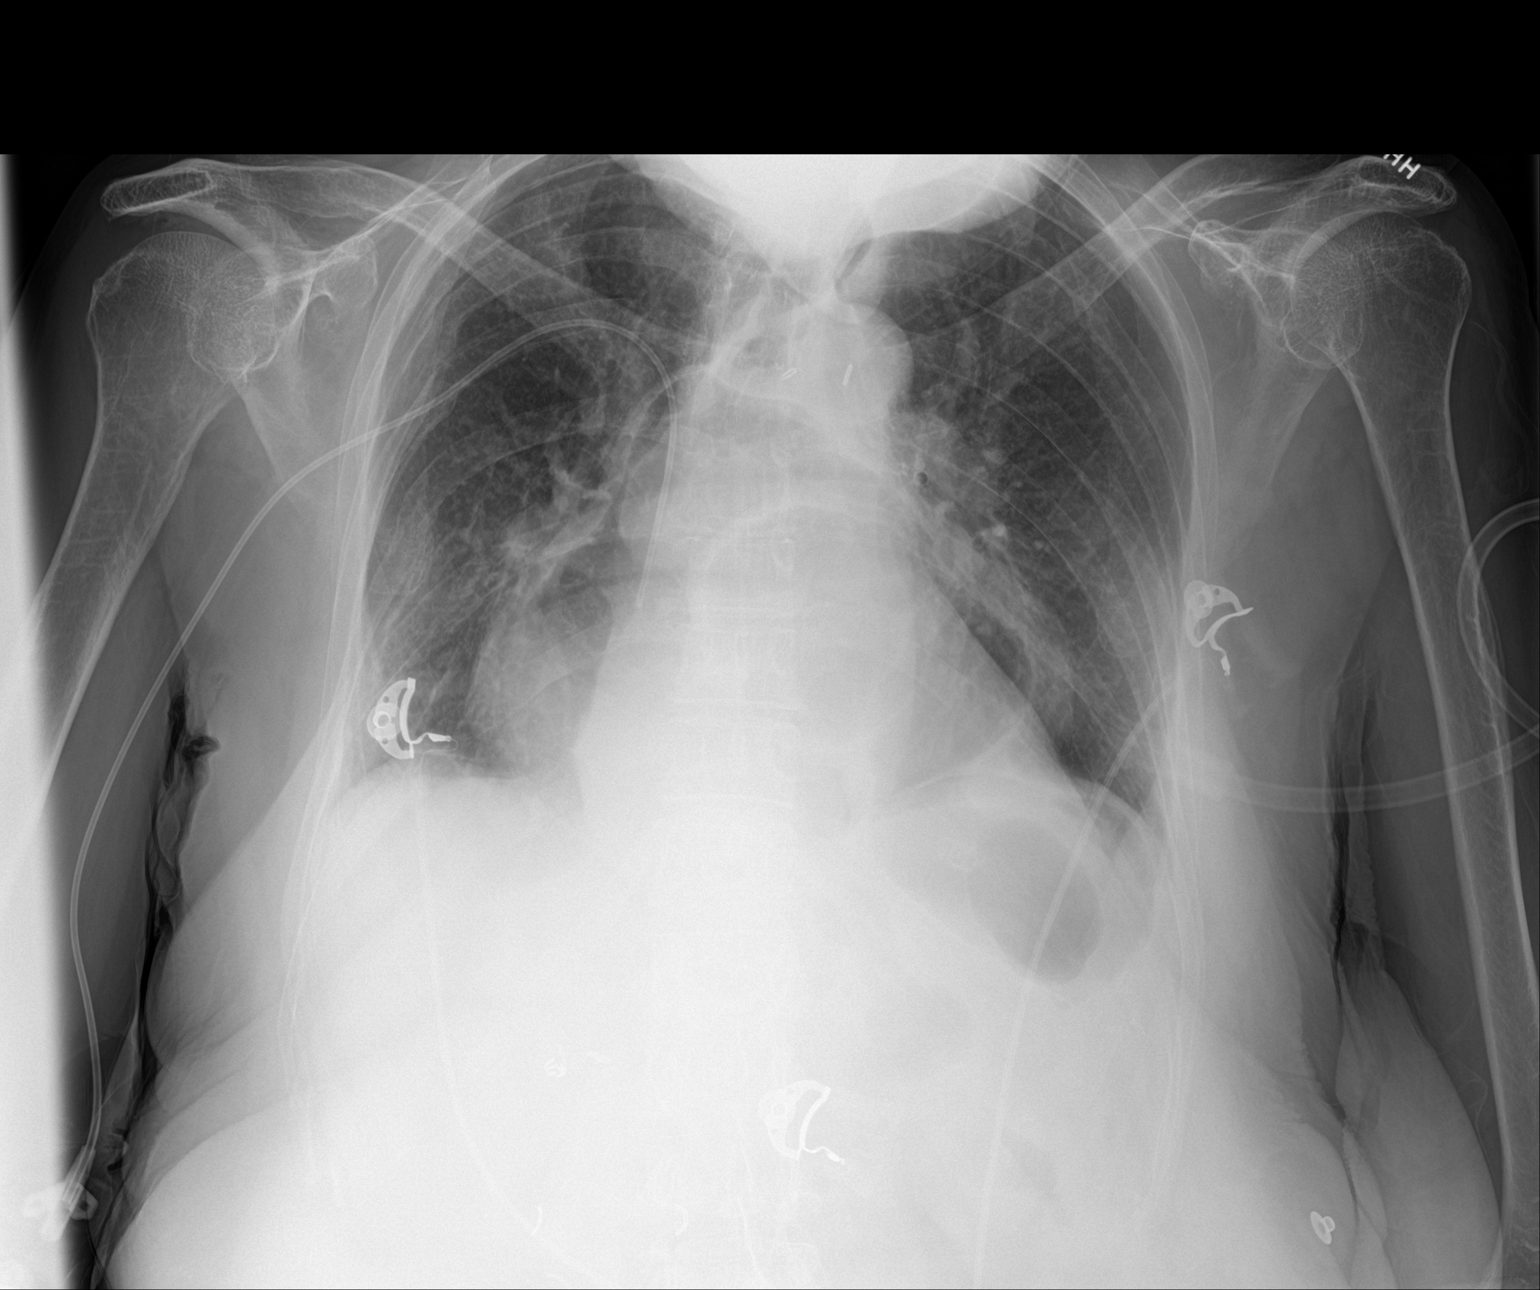

[1 of 1 positions shown; findings below may reference images not displayed]

FINDINGS: Right PICC line tip:  SVC.

Very large hiatal hernia with mixed density and lucency projecting
over the cardiac shadow.

Moderate enlargement of the cardiopericardial silhouette without
edema.

Old bilateral rib deformities from old fractures. Bony
demineralization.

Suspected thoracic kyphosis. The patient's chin obscures the medial
lung apices bilaterally.

No blunting of the costophrenic angles. Aside from presume passive
atelectasis related to the hiatal hernia, the lungs appear clear.
IMPRESSION: 1. Very large hiatal hernia, likely containing most of the stomach.
2. Moderate enlargement of the cardiopericardial silhouette, without
edema.
3. PICC line tip: SVC.
4. Bony demineralization with old bilateral healed rib fractures.

## 2018-06-28 IMAGING — CT CT ABD-PELV W/ CM
2 of 5 series · 15 of 46 positions shown, 17 images · IV contrast (APPLIED)
Comparison: 02/03/2017

CLINICAL DATA: Crohn disease and rectovaginal fistula.

EXAM:
CT ABDOMEN AND PELVIS WITH CONTRAST
TECHNIQUE: Multidetector CT imaging of the abdomen and pelvis was performed
using the standard protocol following bolus administration of
intravenous contrast.
CONTRAST:  15mL [id number] IOPAMIDOL (G915OY-VBB) INJECTION 61%,
100mL G915OY-VBB IOPAMIDOL (G915OY-VBB) INJECTION 61%

[Series 2: axial st · axial · 0.74mm/px · z∈[-304,+26]mm · 12 of 78 slices shown, 14 images]
[im 6/78  soft-tissue]
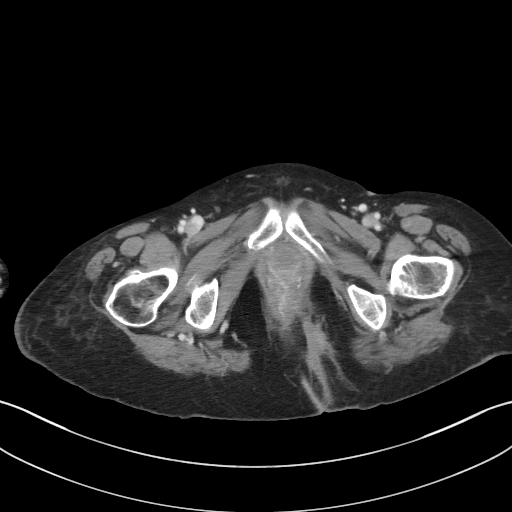
[im 6/78  bone]
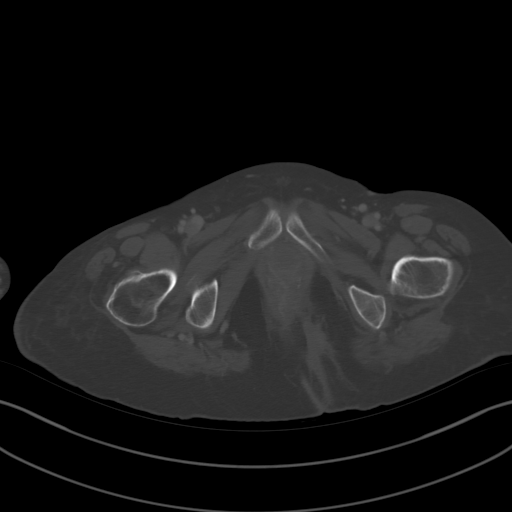
[im 12/78  soft-tissue]
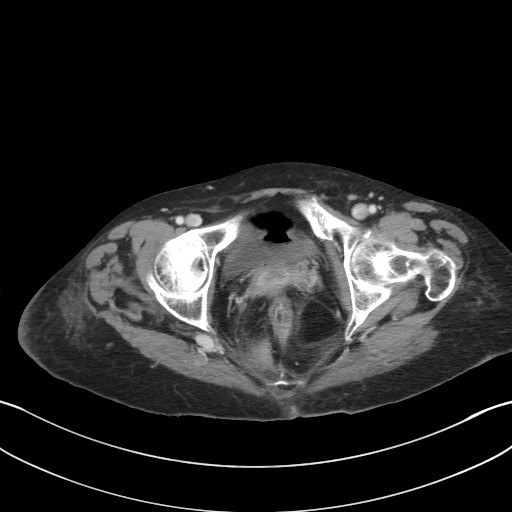
[im 17/78  soft-tissue]
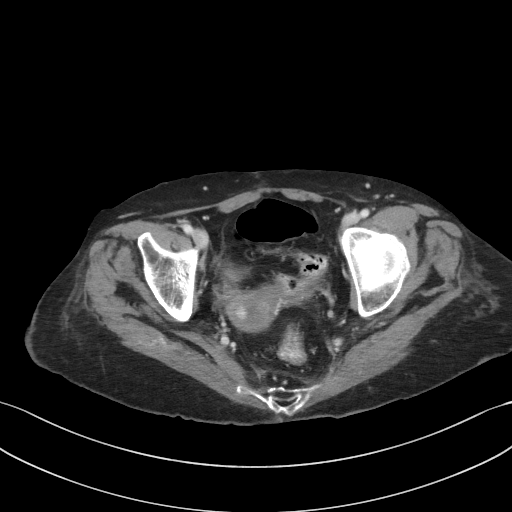
[im 23/78  soft-tissue]
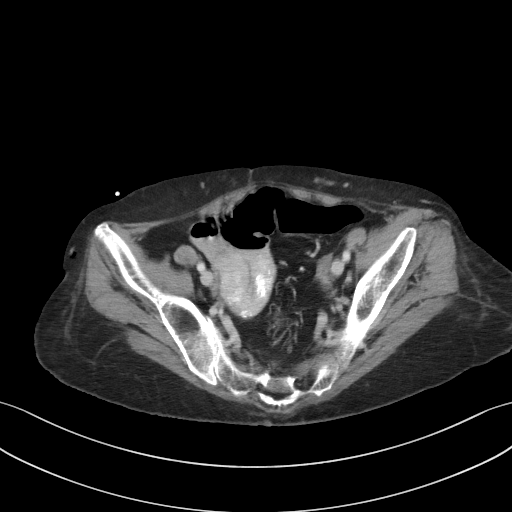
[im 28/78  soft-tissue]
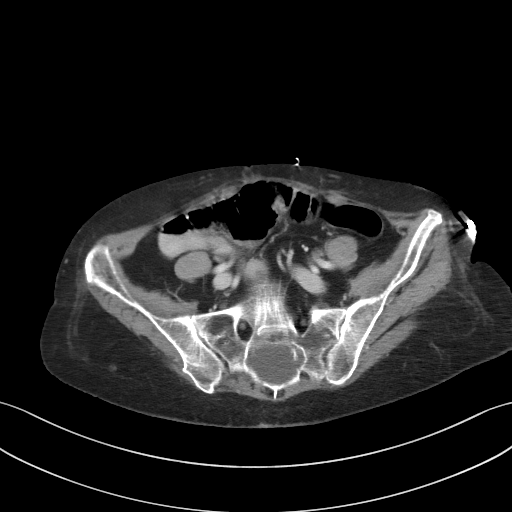
[im 34/78  soft-tissue]
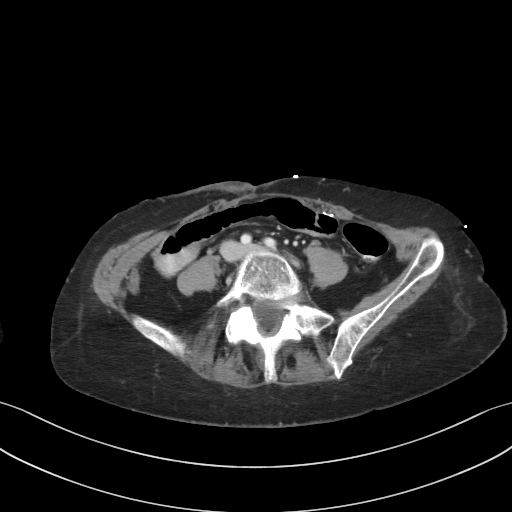
[im 45/78  soft-tissue]
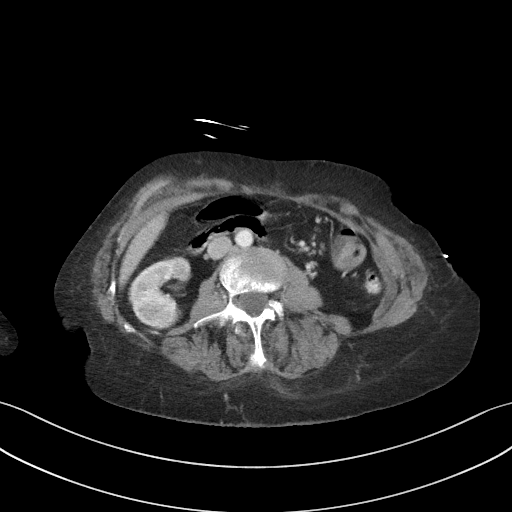
[im 50/78  soft-tissue]
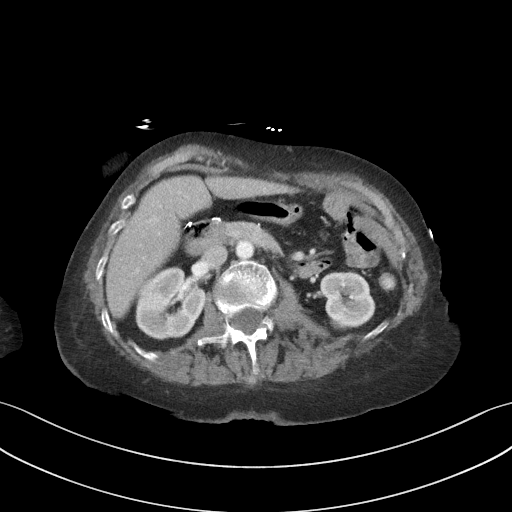
[im 56/78  soft-tissue]
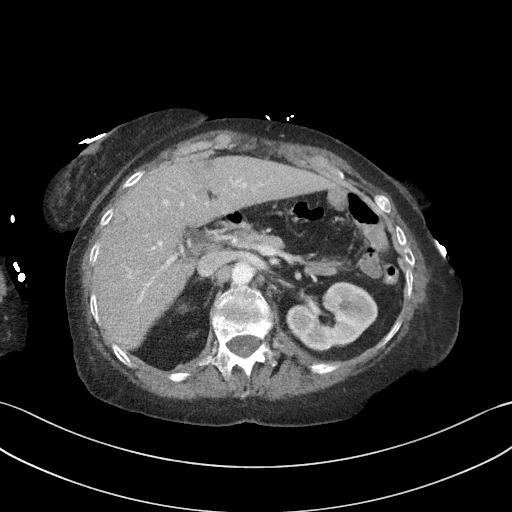
[im 56/78  bone]
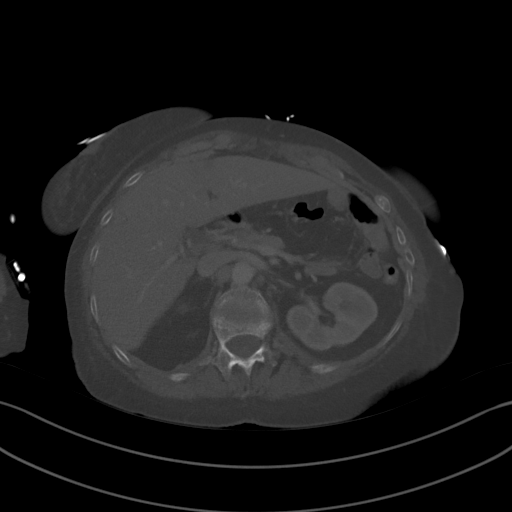
[im 61/78  soft-tissue]
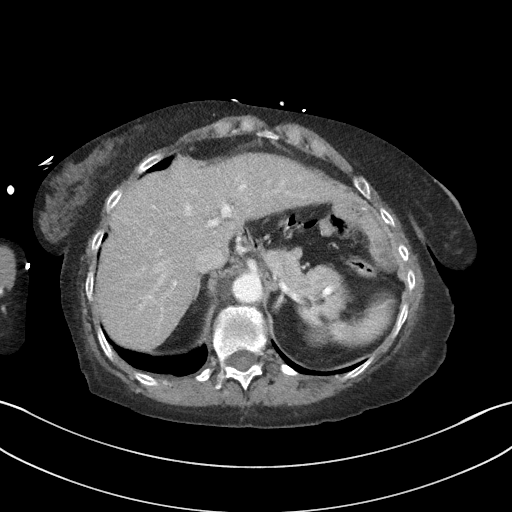
[im 67/78  soft-tissue]
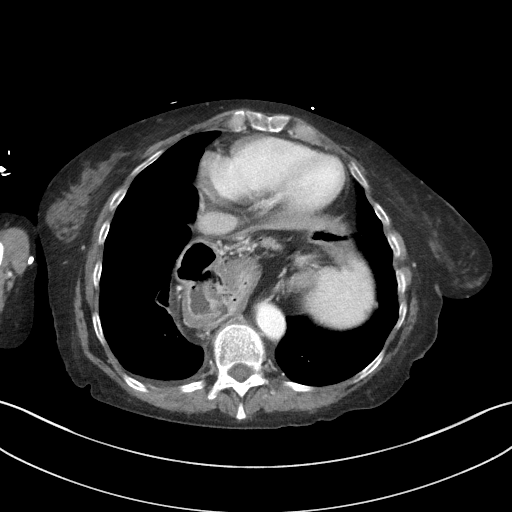
[im 72/78  soft-tissue]
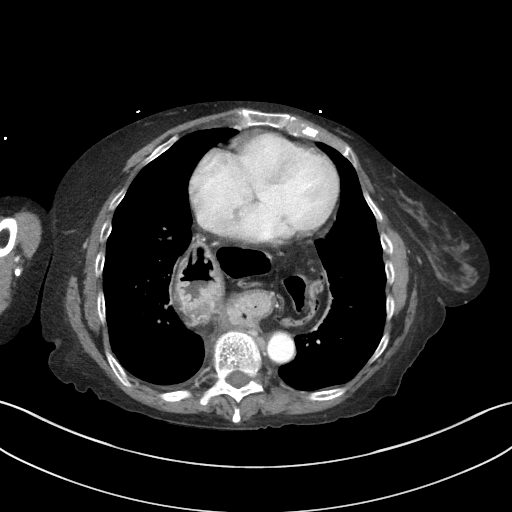

[Series 5: coronal st · coronal · 0.59mm/px · 3 of 74 slices shown]
[im 25/74  soft-tissue]
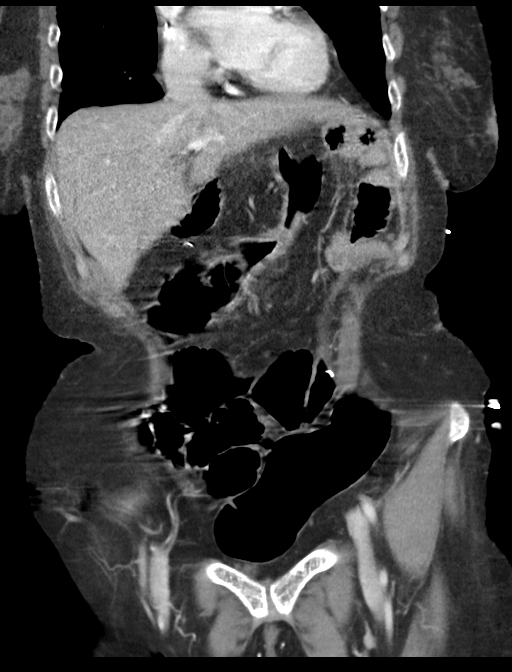
[im 33/74  soft-tissue]
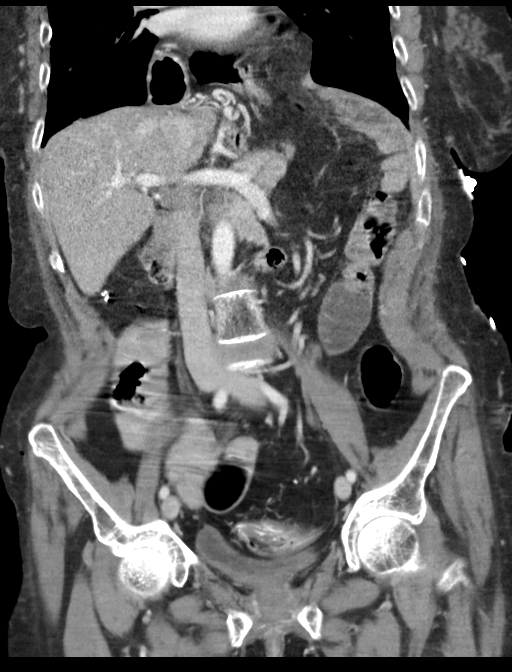
[im 41/74  soft-tissue]
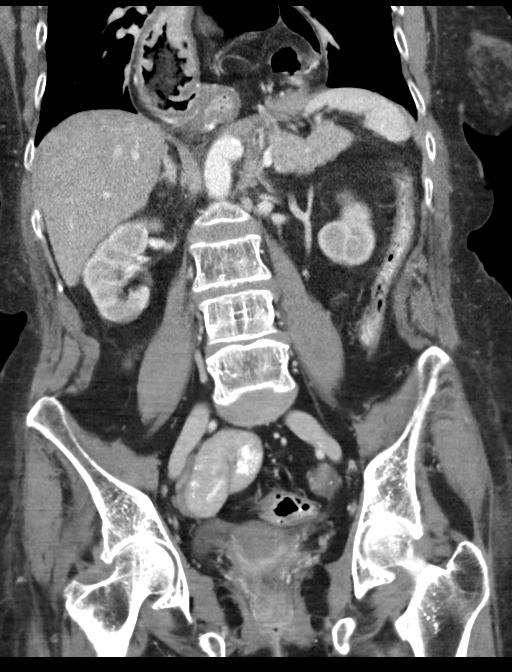

[15 of 46 positions shown; findings below may reference images not displayed]

FINDINGS: Lower chest: Very large hiatal hernia with nearly the entire stomach
in the chest. Segment of transverse colon also extends up into the
hernia.

Hepatobiliary: Small area of low attenuation in the anterior liver,
adjacent to the falciform ligament, is in a characteristic location
for focal fatty deposition. Gallbladder surgically absent. Stable
prominence extrahepatic common duct with no dilatation of the common
bile duct in the head of the pancreas.

Pancreas: No focal mass lesion. No dilatation of the main duct. No
intraparenchymal cyst. No peripancreatic edema.

Spleen: No splenomegaly. No focal mass lesion.

Adrenals/Urinary Tract: No adrenal nodule or mass. No enhancing
lesion in either kidney. Kidneys are not excreting contrast by the 2
minutes renal delayed sequence raising the question of renal
dysfunction. No evidence for hydroureter. Bladder is decompressed.

Stomach/Bowel: As above, nearly the entire stomach is contained
within a hiatal hernia. Duodenum is normally positioned as is the
ligament of Treitz. No small bowel wall thickening. No small bowel
dilatation. Small bowel appears foreshortened suggesting prior
resection. Patient is status post right hemicolectomy. The actual
ileocolic anastomosis may be in the hiatal hernia, but the bowel in
the hernia so collapsed, this is difficult to assess. Sigmoid colon
is draped across what appears to be the uterus (see below). The
inflammation in the perianal region is similar to prior.

Vascular/Lymphatic: No abdominal aortic aneurysm. There is no
gastrohepatic or hepatoduodenal ligament lymphadenopathy. No
intraperitoneal or retroperitoneal lymphadenopathy. No pelvic
sidewall lymphadenopathy.

Reproductive: There is more soft tissue cranial to the vagina than
expected in the setting of hysterectomy. As such, and given that no
surgical history of hysterectomy is identified in [REDACTED], this soft
tissue is presumably the body and fundus of the uterus. The sigmoid
colon is draped across the left aspect of the presumed fundus but
there is no wall thickening in this segment of the colon. No direct
contact between colon or small bowel and the vagina is evident.

Other: No intraperitoneal free fluid.

Musculoskeletal: Bone windows reveal no worrisome lytic or sclerotic
osseous lesions. Mild compression deformity noted at the level of
L1.
IMPRESSION: 1. Similar perianal stranding and inflammation with potential tiny
fistulous tract between the anus and the skin. This
edema/inflammation also tracks centrally towards the vaginal
introitus.
2. Very large hiatal hernia containing most of the stomach, the
ileocolic anastomosis, and proximal transverse colon. No evidence
for obstruction.
3. Stable appearance compression deformity at L1.

## 2018-08-10 IMAGING — CT CT ABD-PELV W/ CM
2 of 5 series · 15 of 46 positions shown, 17 images · IV contrast (ISOVUE)
Comparison: June 14, 2017

CLINICAL DATA: Patient is complaining of left upper and lower
quadrant pain. Question Crohn's exacerbation.

EXAM:
CT ABDOMEN AND PELVIS WITH CONTRAST
TECHNIQUE: Multidetector CT imaging of the abdomen and pelvis was performed
using the standard protocol following bolus administration of
intravenous contrast.
CONTRAST:  80mL [id number] IOPAMIDOL ([id number]) INJECTION 61%

[Series 3: abd/pel with · axial · 0.74mm/px · z∈[+1279,+1599]mm · 12 of 76 slices shown, 14 images]
[im 6/76  soft-tissue]
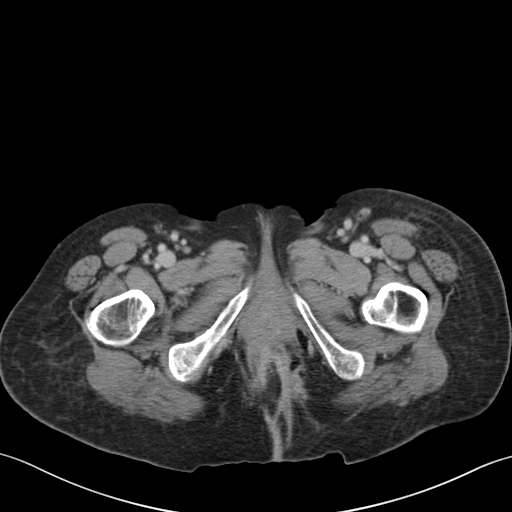
[im 6/76  bone]
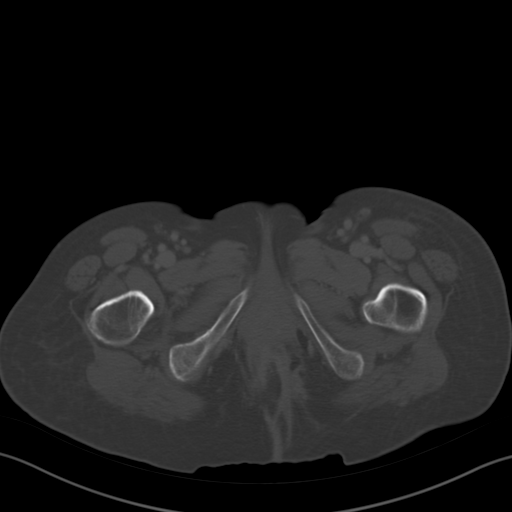
[im 11/76  soft-tissue]
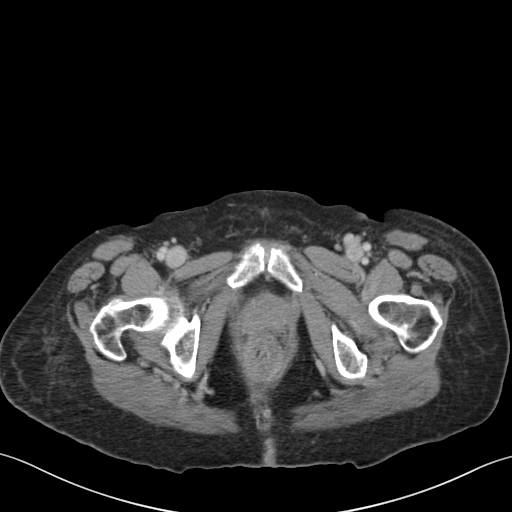
[im 17/76  soft-tissue]
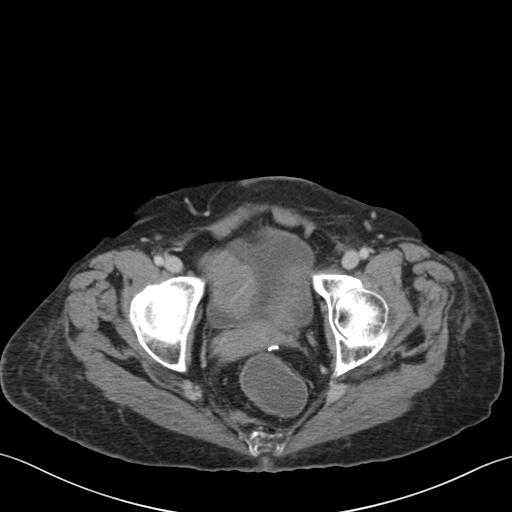
[im 22/76  soft-tissue]
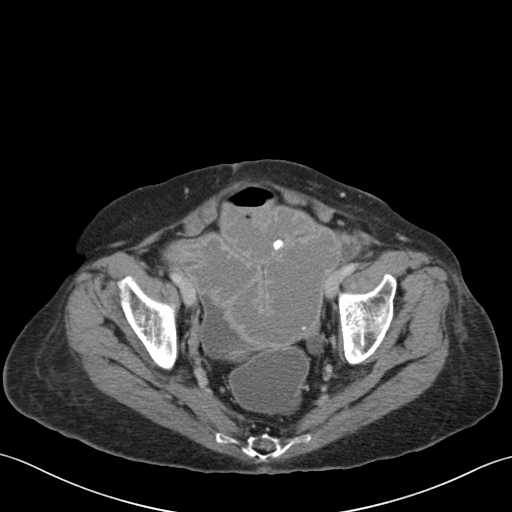
[im 27/76  soft-tissue]
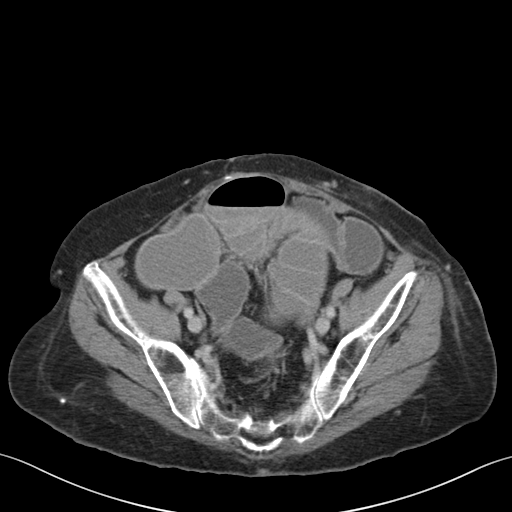
[im 33/76  soft-tissue]
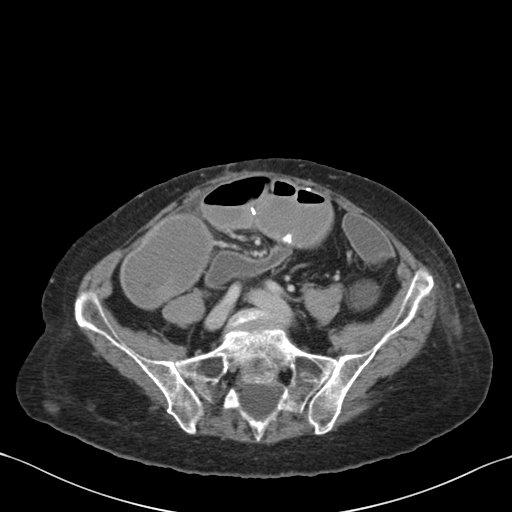
[im 43/76  soft-tissue]
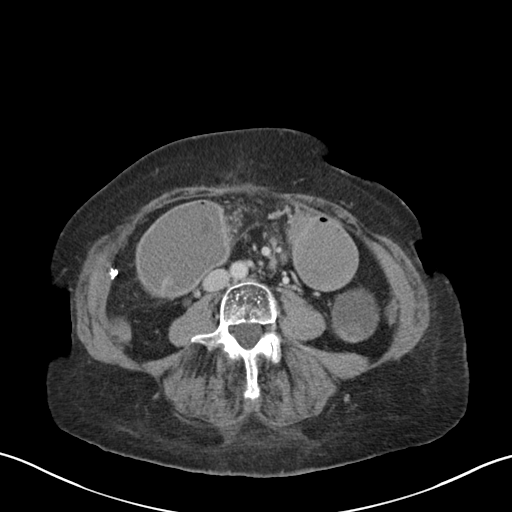
[im 49/76  soft-tissue]
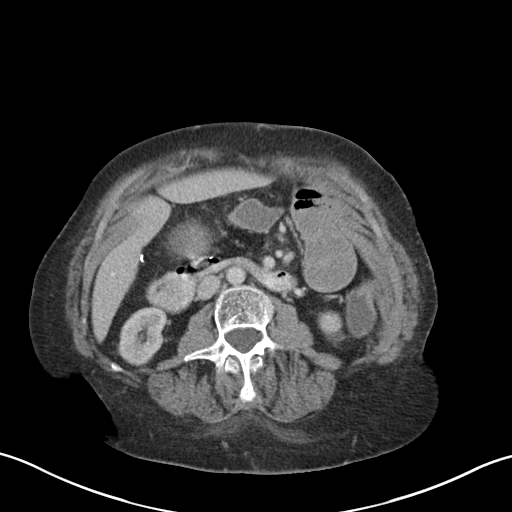
[im 54/76  soft-tissue]
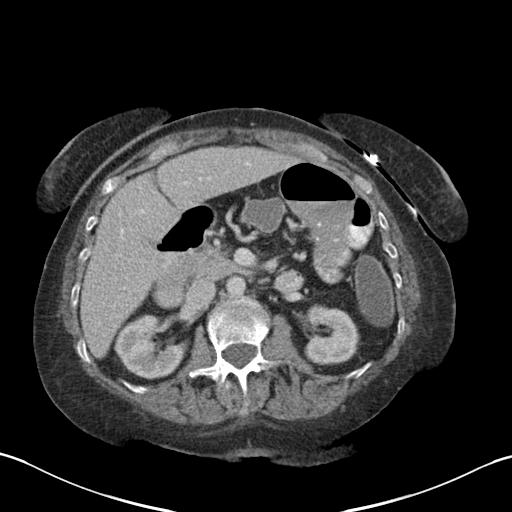
[im 54/76  bone]
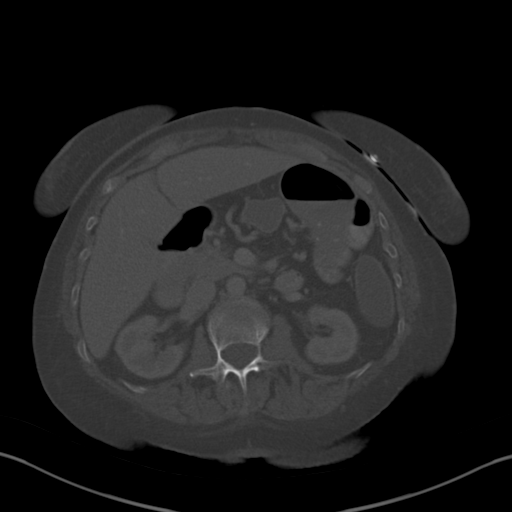
[im 59/76  soft-tissue]
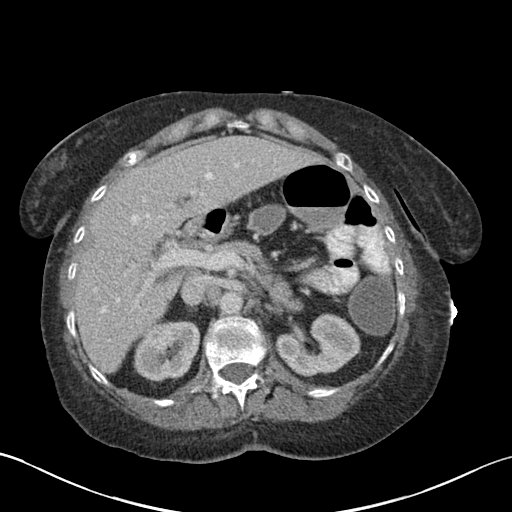
[im 65/76  soft-tissue]
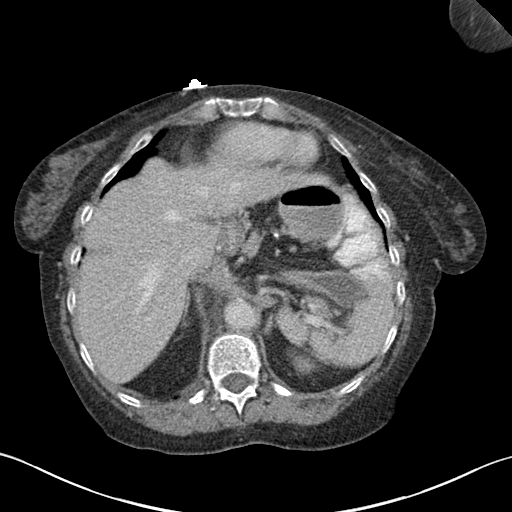
[im 70/76  soft-tissue]
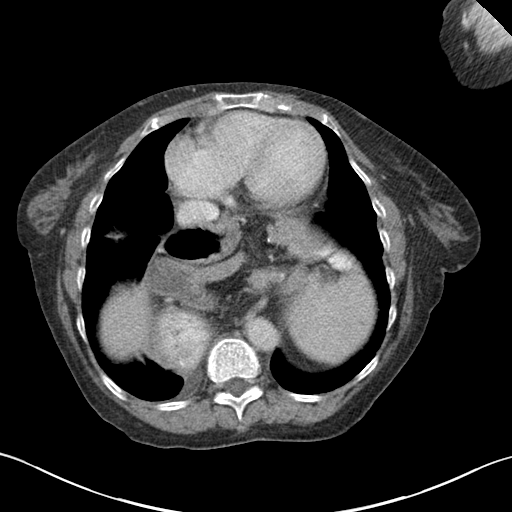

[Series 4: coronal a/|p · coronal · 0.77mm/px · 3 of 125 slices shown]
[im 42/125  soft-tissue]
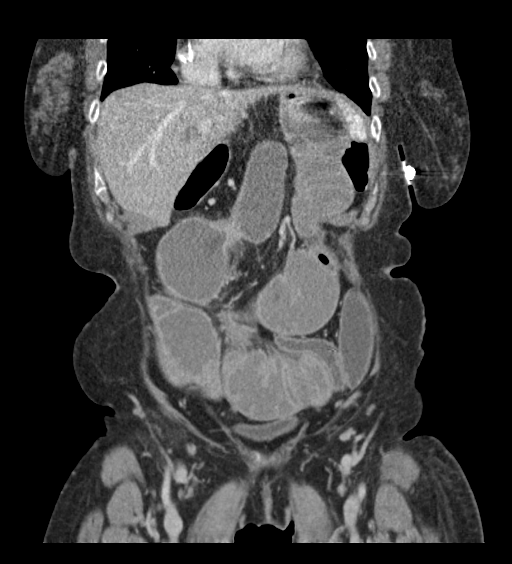
[im 56/125  soft-tissue]
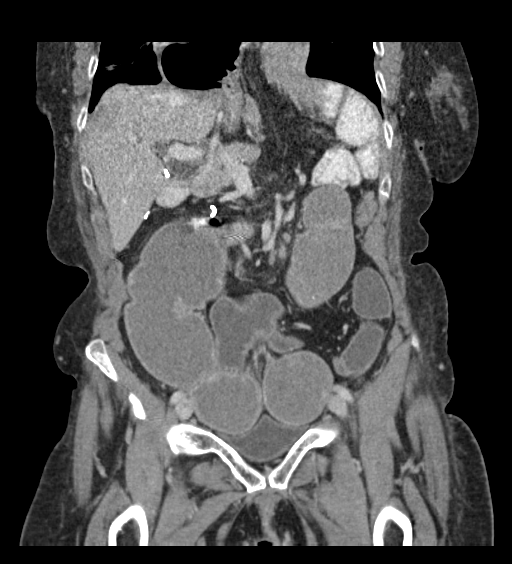
[im 69/125  soft-tissue]
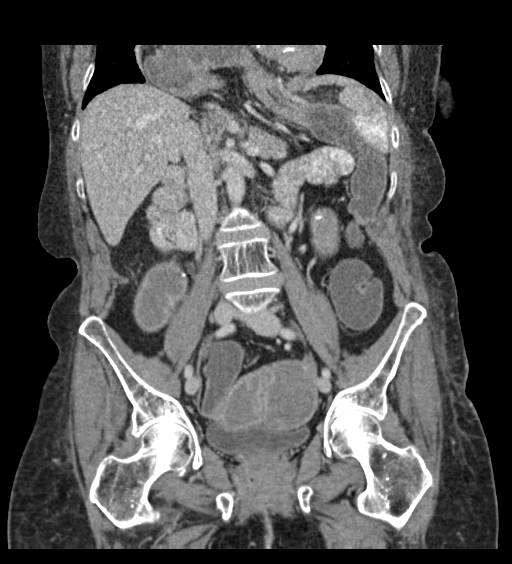

[15 of 46 positions shown; findings below may reference images not displayed]

FINDINGS: Lower chest: There is a very large hiatal hernia which will be
described below. The hiatal hernia displaces the heart anteriorly.
Lung bases are normal.

Hepatobiliary: The patient is status post cholecystectomy. No liver
lesions identified. The portal vein is patent.

Pancreas: Unremarkable. No pancreatic ductal dilatation or
surrounding inflammatory changes.

Spleen: Normal in size without focal abnormality.

Adrenals/Urinary Tract: Adrenal glands are normal. The kidneys,
ureters, and bladder are normal.

Stomach/Bowel: The patient has a very large hiatal hernia. The
visualized portion of the stomach is otherwise normal. The duodenum
and proximal jejunum are normal as well. The mid jejunum then
extends superiorly into the hiatal hernia. The entire extend of
jejunum within the hernia is not included on today's study. However,
there are dilated loops of bowel within the hernia. The dilatation
extends to a region of thickening and inflammation in the bowel of
the right lower mid abdomen on coronal image 44 on axial image 30.
Distal to this transition point, fluid-filled bowel extends back
into the hiatal hernia before exiting the hernia and connecting to
the descending colon. The descending colon is fluid-filled along its
length extending to the level of the rectum. Another apparent
transition point is associated with a loop of dilated bowel in the
hernia as seen on coronal image 83. It is difficult to follow the
decompressed bowel beyond this level.

Vascular/Lymphatic: No significant vascular findings are present. No
enlarged abdominal or pelvic lymph nodes.

Reproductive: Uterus and bilateral adnexa are unremarkable.

Other: Increased attenuation in the fat of the perineum is stable.

Musculoskeletal: No acute or significant osseous findings.
IMPRESSION: 1. There is a complicated bowel obstruction. There is a transition
point in the lower abdomen, just to the right of midline as seen on
axial image 30. There appears to be focal inflammation in this
region and this could represent a site of Crohn's exacerbation
resulting in the obstruction. Distal to this site, the colon is
fluid-filled and smaller but not completely decompressed in caliber.
The dilated loops of bowel within the hiatal hernia are not
completely imaged today. There is suggestion of other transition
points which may be contributing to the small bowel obstruction
within the hernia as above.
2. No other interval changes or acute abnormalities.

## 2018-08-11 IMAGING — DX DG ABDOMEN 1V
1 series · 1 of 1 positions shown · non-contrast
Comparison: Abdominal and pelvic CT scan July 27, 2017

CLINICAL DATA: Follow-up of small bowel obstruction.

EXAM:
ABDOMEN - 1 VIEW

[abdomen kub]
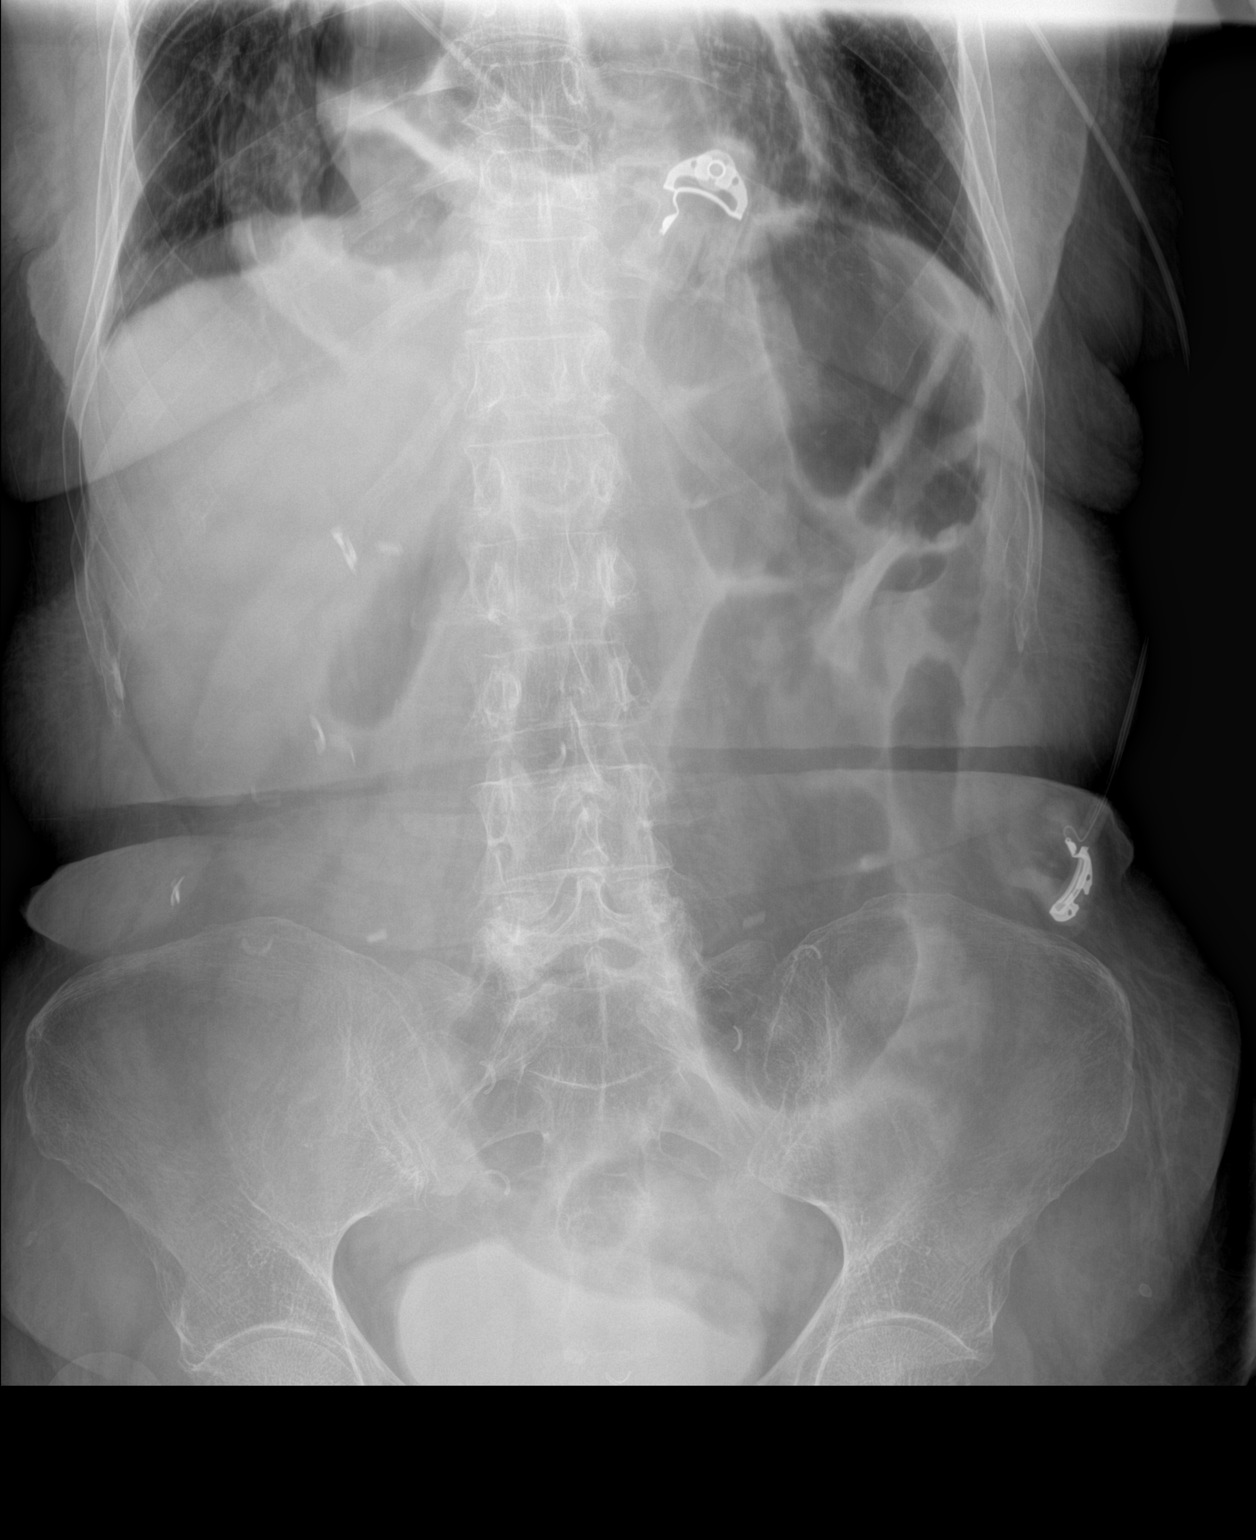

[1 of 1 positions shown; findings below may reference images not displayed]

FINDINGS: There is a moderate amount of gas within what appears to be the left
colon. No significant small bowel distention is observed today. No
free extraluminal gas collections are observed. There is a moderate
amount of contrast within the urinary bladder.
IMPRESSION: Moderate amount of gas within the left colon without objective
evidence of small bowel ileus or obstruction.

## 2018-09-23 IMAGING — DX DG CHEST 1V PORT
2 series · 2 of 2 positions shown · non-contrast
Comparison: Chest x-ray of June 12, 2017

CLINICAL DATA: Follow-up shortness of breath history of a hiatal
hernia.

EXAM:
PORTABLE CHEST 1 VIEW

[chest ap (1 of 2)]
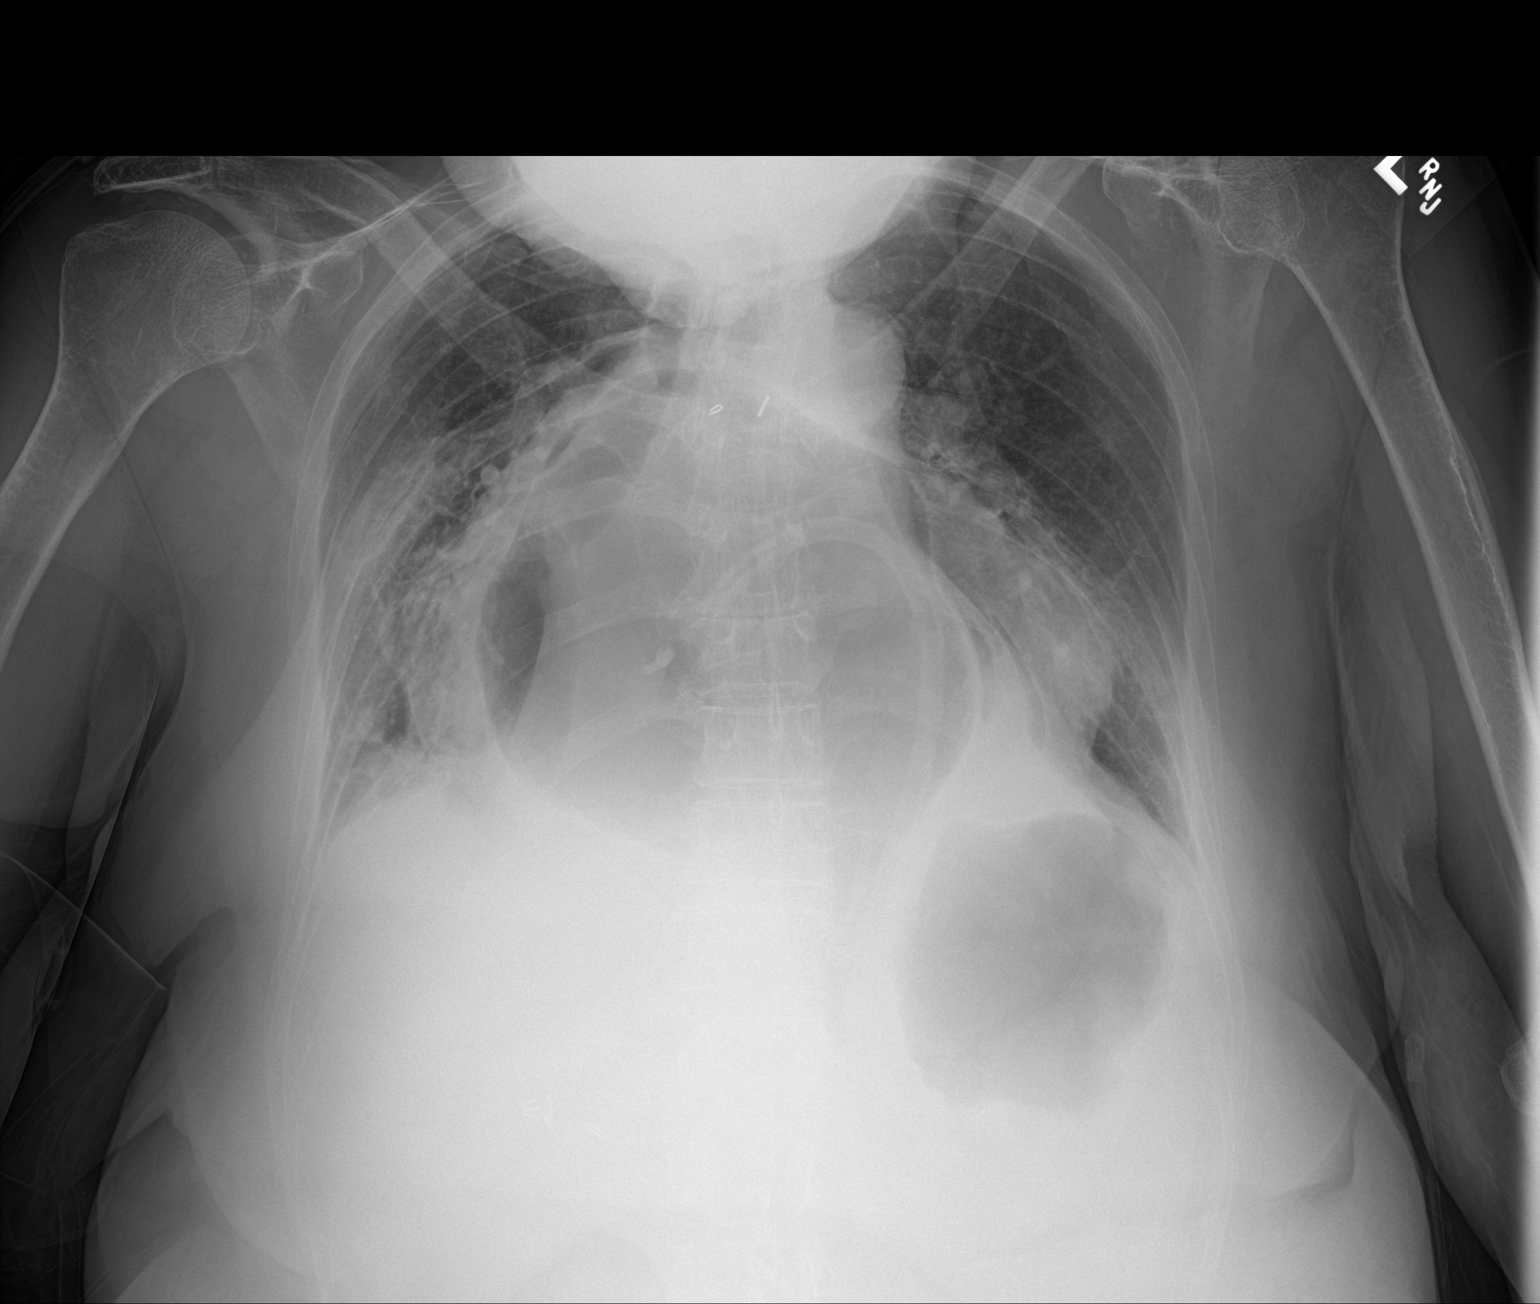

[chest ap (2 of 2)]
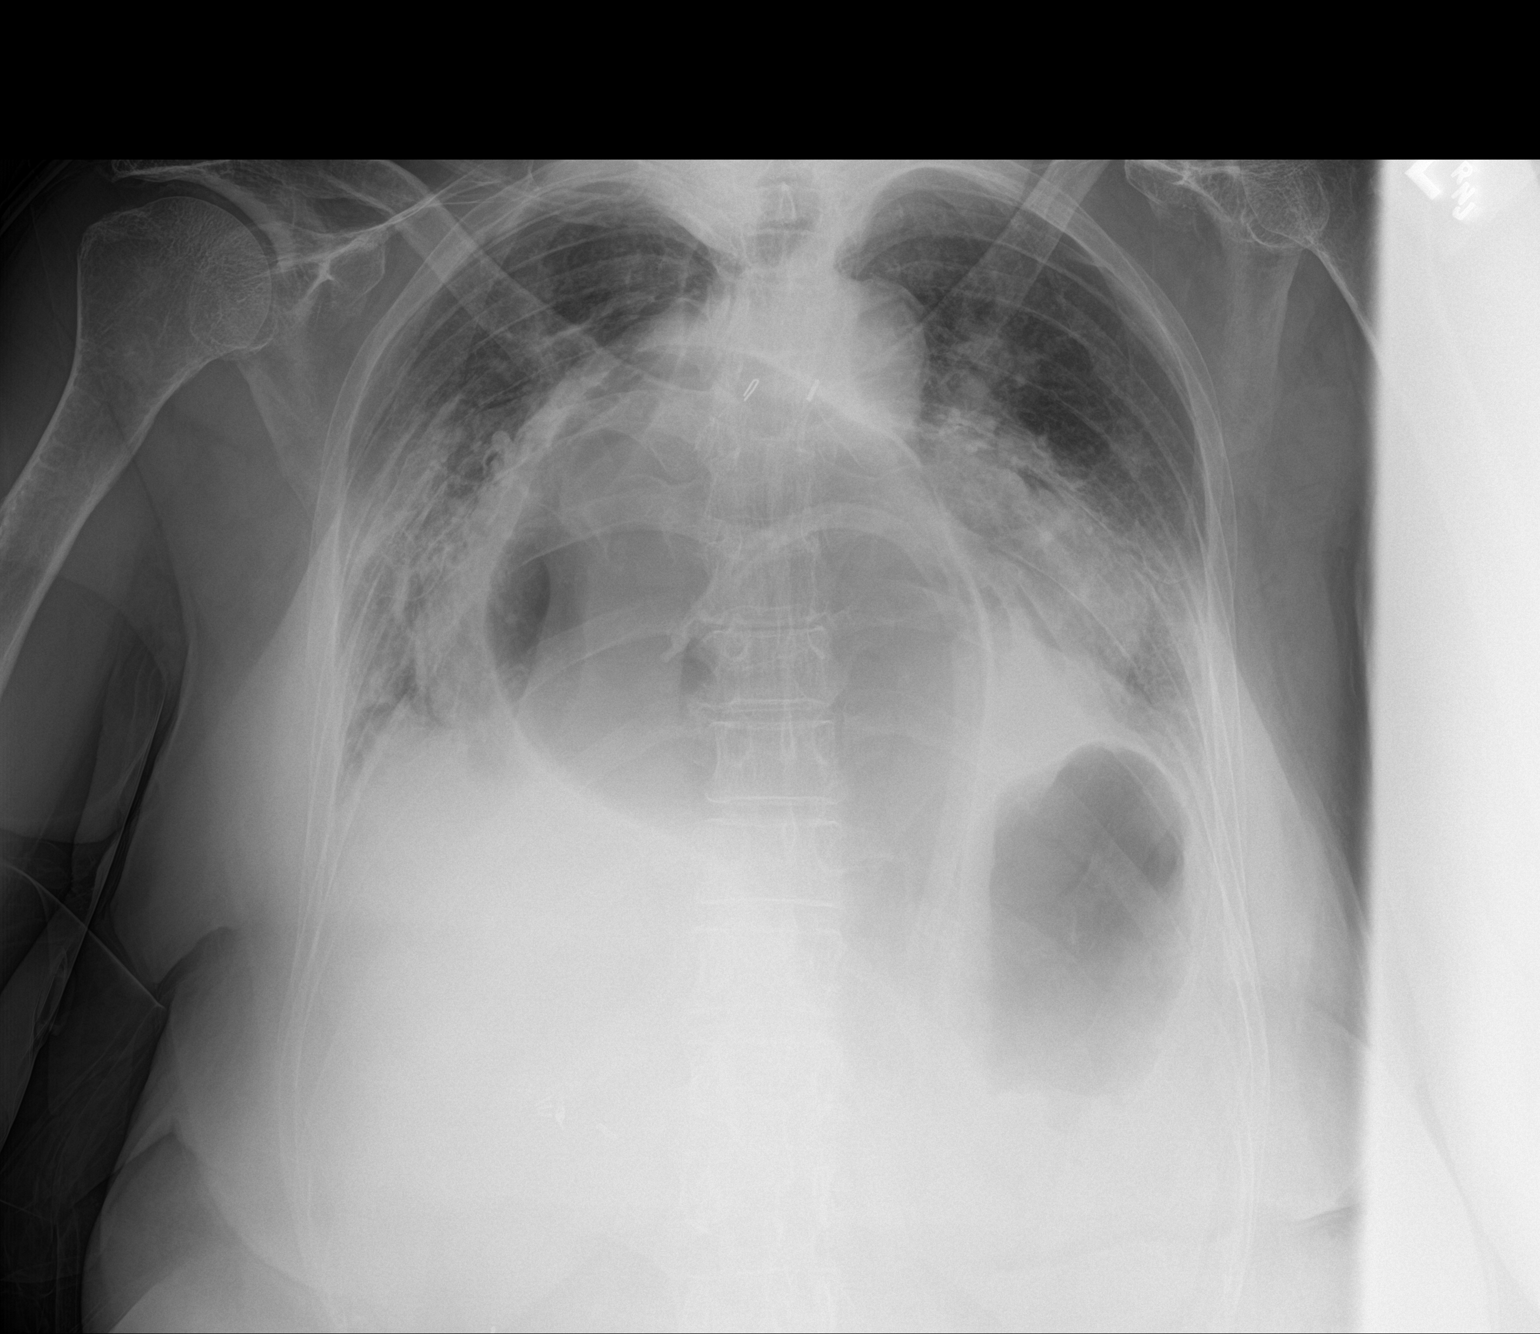

[2 of 2 positions shown; findings below may reference images not displayed]

FINDINGS: There is marked dilation of a large diaphragmatic hernia containing
gas-filled stomach and possibly bowel. A tubular structure overlies
this region which could reflect a G-tube in the appropriate setting.
There is bibasilar atelectasis adjacent to the hiatal hernia-
intrathoracic stomach. There is no pleural effusion. The cardiac
silhouette is enlarged. The pulmonary vascularity is not engorged.
IMPRESSION: Subsegmental atelectasis adjacent to the large diaphragmatic hernia.
There is gaseous distention of the stomach. There may be bowel loops
above the hemidiaphragm as well. No discrete pneumonia. No CHF.

## 2018-09-23 IMAGING — CT CT ANGIO CHEST
4 of 9 series · 17 of 46 positions shown · IV contrast (isovue)
Comparison: CT chest 05/08/2017 and CT abdomen/ pelvis 06/14/2017

CLINICAL DATA: Shortness of breath with severe abdominal pain.
Recent paraesophageal hernia repair.

EXAM:
CT ANGIOGRAPHY CHEST
CT ABDOMEN AND PELVIS WITH CONTRAST
TECHNIQUE: Multidetector CT imaging of the chest was performed using the
standard protocol during bolus administration of intravenous
contrast. Multiplanar CT image reconstructions and MIPs were
obtained to evaluate the vascular anatomy. Multidetector CT imaging
of the abdomen and pelvis was performed using the standard protocol
during bolus administration of intravenous contrast.
CONTRAST:  80 mL Isovue 370 IV.

[Series 5: axial st · axial · 0.62mm/px · z∈[+1394,+1460]mm · 3 of 67 slices shown (1 of 2)]
[im 12/67  lung]
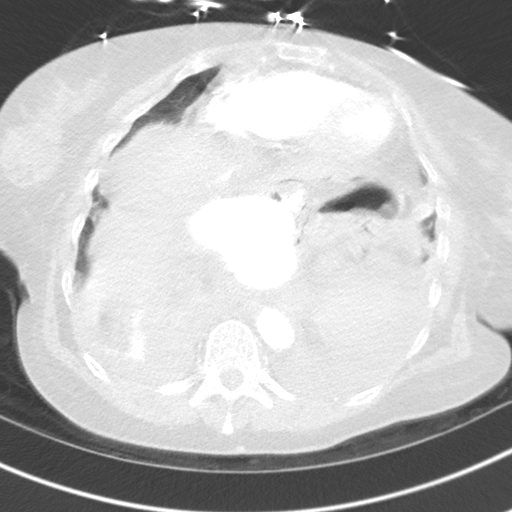
[im 23/67  lung]
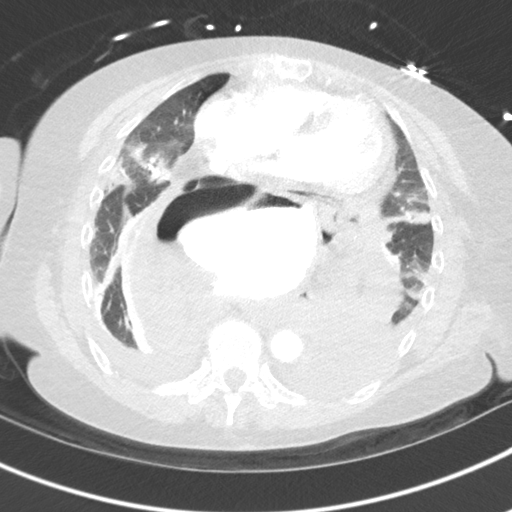
[im 34/67  lung]
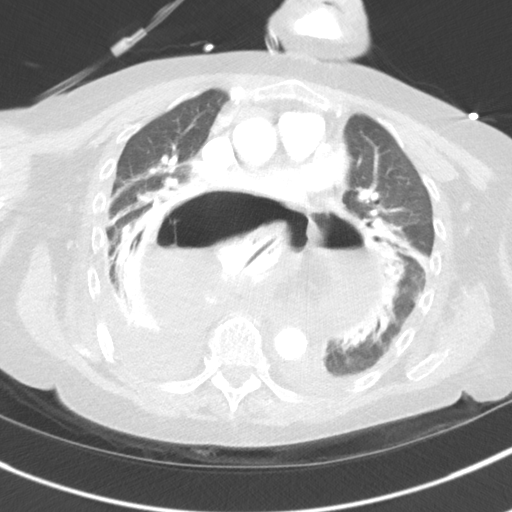

[Series 6: thins · axial · 0.62mm/px · z∈[+1384,+1546]mm · 8 of 199 slices shown]
[im 24/199  lung]
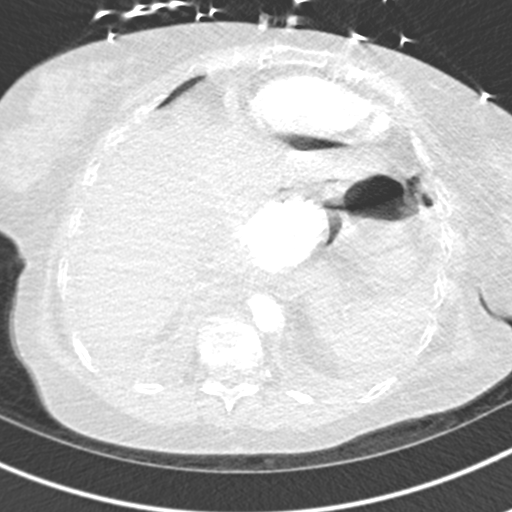
[im 47/199  lung]
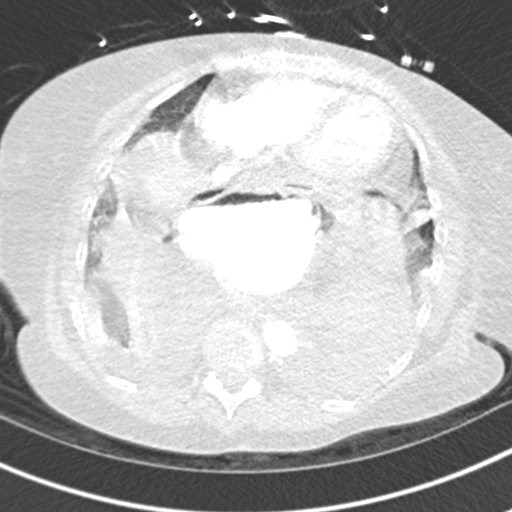
[im 70/199  lung]
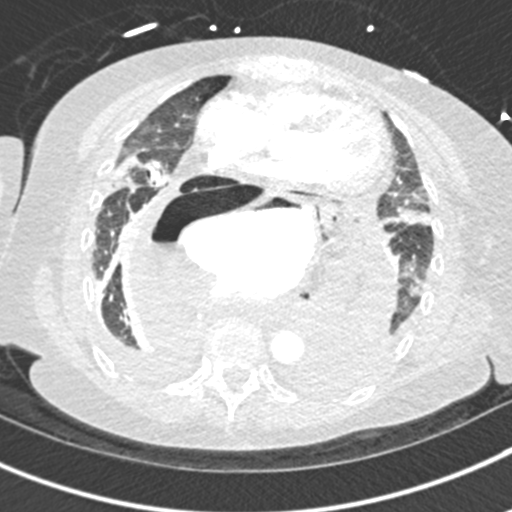
[im 94/199  lung]
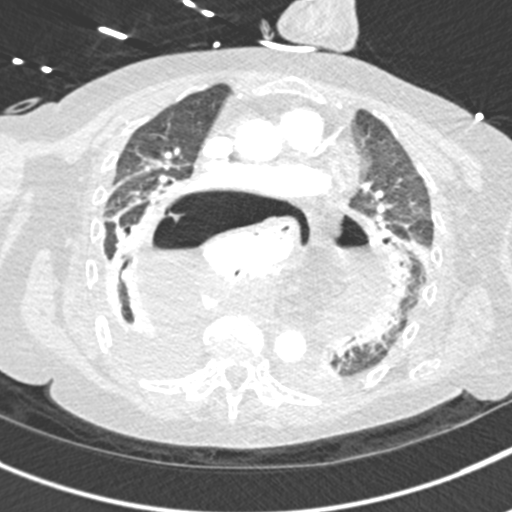
[im 117/199  lung]
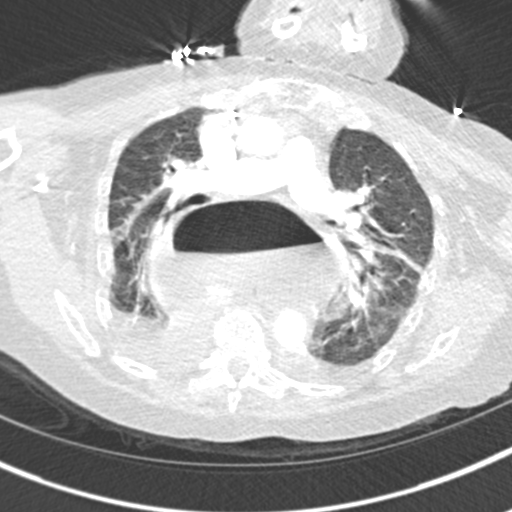
[im 140/199  lung]
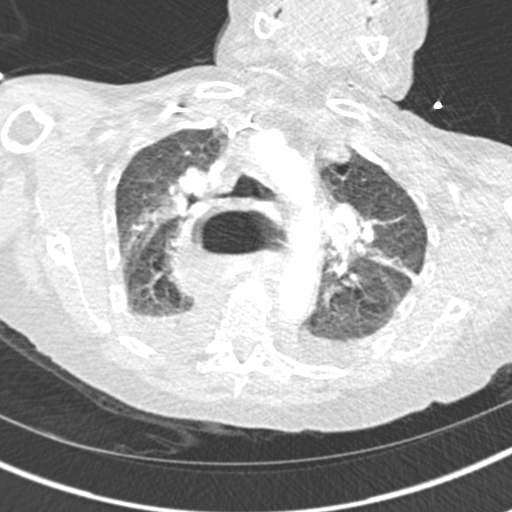
[im 164/199  lung]
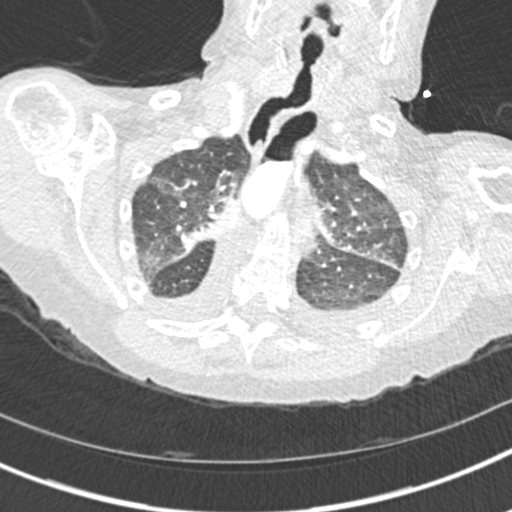
[im 187/199  lung]
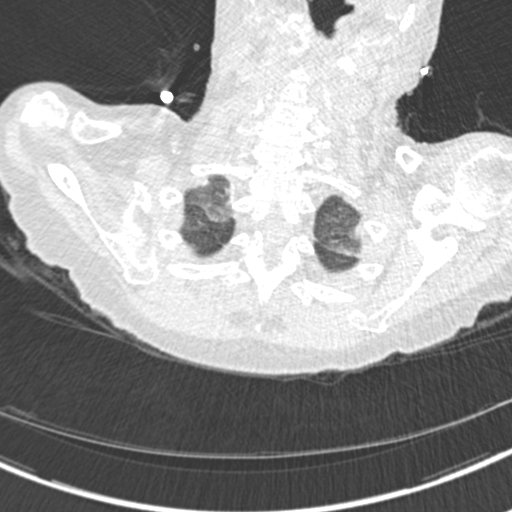

[Series 7: coronal mpr · coronal · 0.59mm/px · 1 of 150 slices shown]
[im 75/150  soft-tissue]
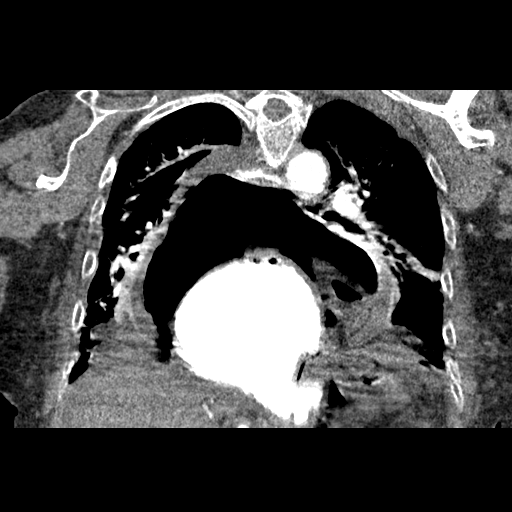

[Series 12: axial st · axial · 0.87mm/px · z∈[+1098,+1363]mm · 5 of 81 slices shown (2 of 2)]
[im 14/81  lung]
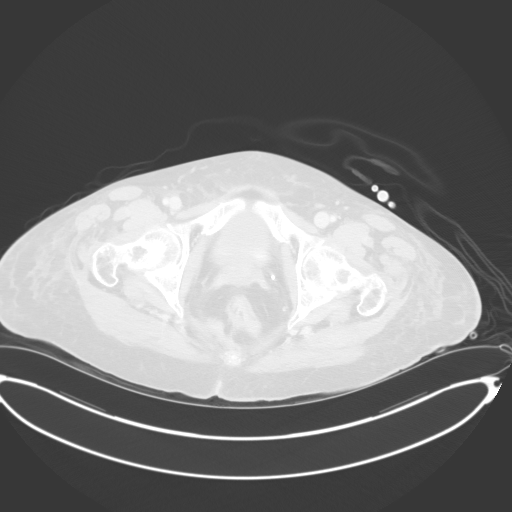
[im 27/81  soft-tissue]
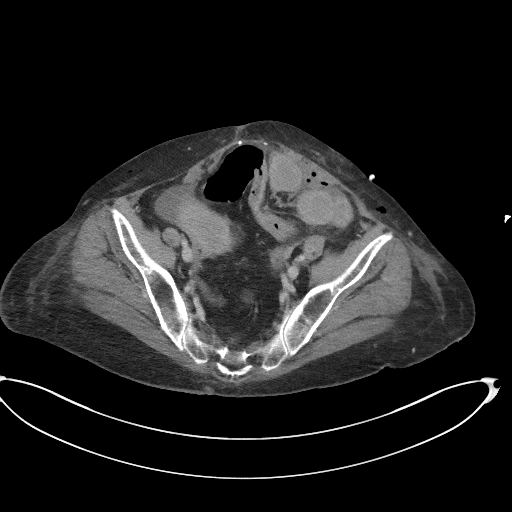
[im 41/81  lung]
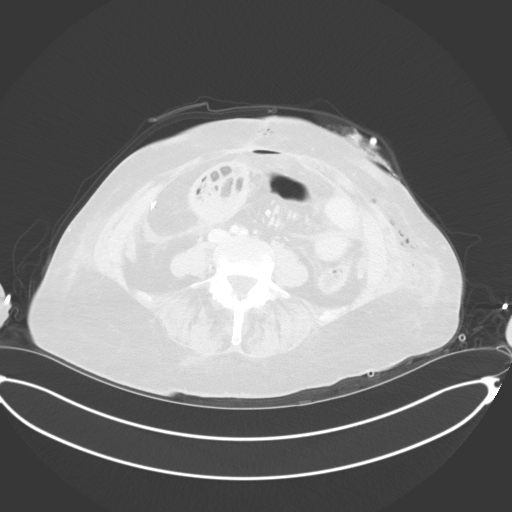
[im 54/81  soft-tissue]
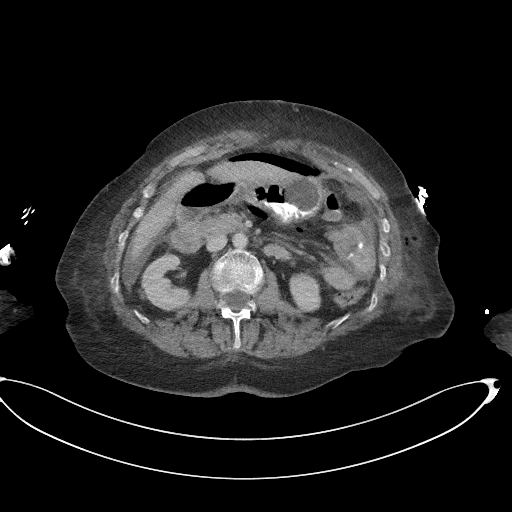
[im 67/81  lung]
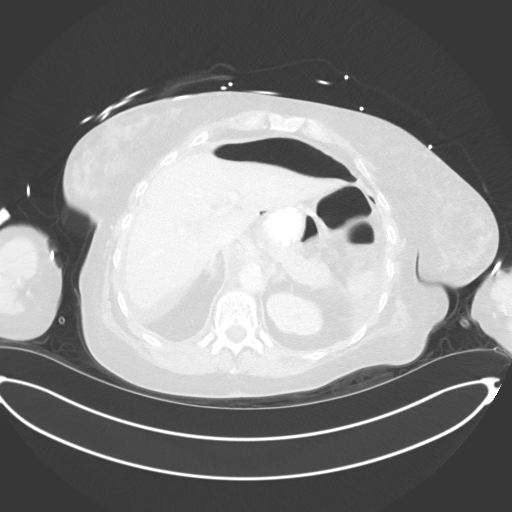

[17 of 46 positions shown; findings below may reference images not displayed]

FINDINGS: CTA CHEST FINDINGS

Cardiovascular: Heart is normal size an compressed anteriorly by the
large hiatal hernia and postoperative air and fluid over the
mediastinum adjacent the distal esophagus and hiatal hernia. Minimal
calcified plaque over the left anterior descending coronary artery.
Thoracic aorta is within normal. No definite pulmonary emboli.

Mediastinum/Nodes: No mediastinal or hilar adenopathy. There is a
large collection over the mid to lower mediastinum including
patient's large hiatal hernia containing contrast as well as a
segment of catheter tubing likely extending up from below from
patient's percutaneous gastrostomy tube. There is moderate fluid and
air around the hiatal hernia and esophagus over the mid to lower
mediastinum as the prior exam demonstrated multiple additional bowel
loops in this region. It is difficult to determine how much of this
air-fluid is free air and fluid versus within bowel loops. No
evidence of contrast extravasation.

Lungs/Pleura: Examination demonstrates mild bilateral atelectatic
change and small effusions.

Review of the MIP images confirms the above findings.

CT ABDOMEN and PELVIS FINDINGS

Hepatobiliary: Previous cholecystectomy.  Liver is unremarkable.

Pancreas: Pancreas is within normal.

Spleen: Spleen is unremarkable.

Adrenals/Urinary Tract: Adrenal glands and kidneys are within
normal. Ureters and bladder are unremarkable.

Stomach/Bowel: As described above as there is a persistent large
hiatal hernia likely with additional bowel loops within the mid to
lower mediastinum. Cannot exclude free fluid and free air in the mid
to lower mediastinum. No contrast extravasation. Percutaneous
gastrostomy tube over the left upper quadrant appears in adequate
position. Moderate free peritoneal air over the upper abdomen
compatible recent surgery and gastrostomy tube placement.

Patient had a previous partial colectomy. There are surgical clips
over several small bowel loops. There are no definite dilated small
bowel loops. Visualize colon is unremarkable. There is mild ascites
likely from patient's recent surgery.

Vascular/Lymphatic: Within normal.

Reproductive: Within normal.

Other: Postsurgical change over the anterior abdominal wall.

Musculoskeletal: Degenerative change of the spine and hips. Stable
L1 mild compression fracture. Tarlov cyst.

Review of the MIP images confirms the above findings.
IMPRESSION: No evidence of pulmonary embolism. Small bilateral pleural effusions
with bilateral atelectasis.

Persistent large hiatal hernia with large fluid and air collection
within the mid to lower mediastinum some of which may be within
bowel loops, although it is difficult to assess how much of this air
and fluid is free and related to patient's surgery as well as
gastrostomy tube placement. There is contrast within the esophagus
and hiatal hernia without contrast extravasation. Gastrostomy tube
in adequate position.

Postsurgical changes over the upper abdomen with free air and
ascites. No evidence of bowel obstruction.

Evidence of previous partial colectomy.

Stable L1 compression fracture.

## 2018-09-23 IMAGING — DX DG ABD PORTABLE 1V
1 series · 1 of 1 positions shown · non-contrast
Comparison: 07/28/2017.

CLINICAL DATA: Abdominal pain.  Hiatal hernia repair.

EXAM:
PORTABLE ABDOMEN - 1 VIEW

[abdomen kub]
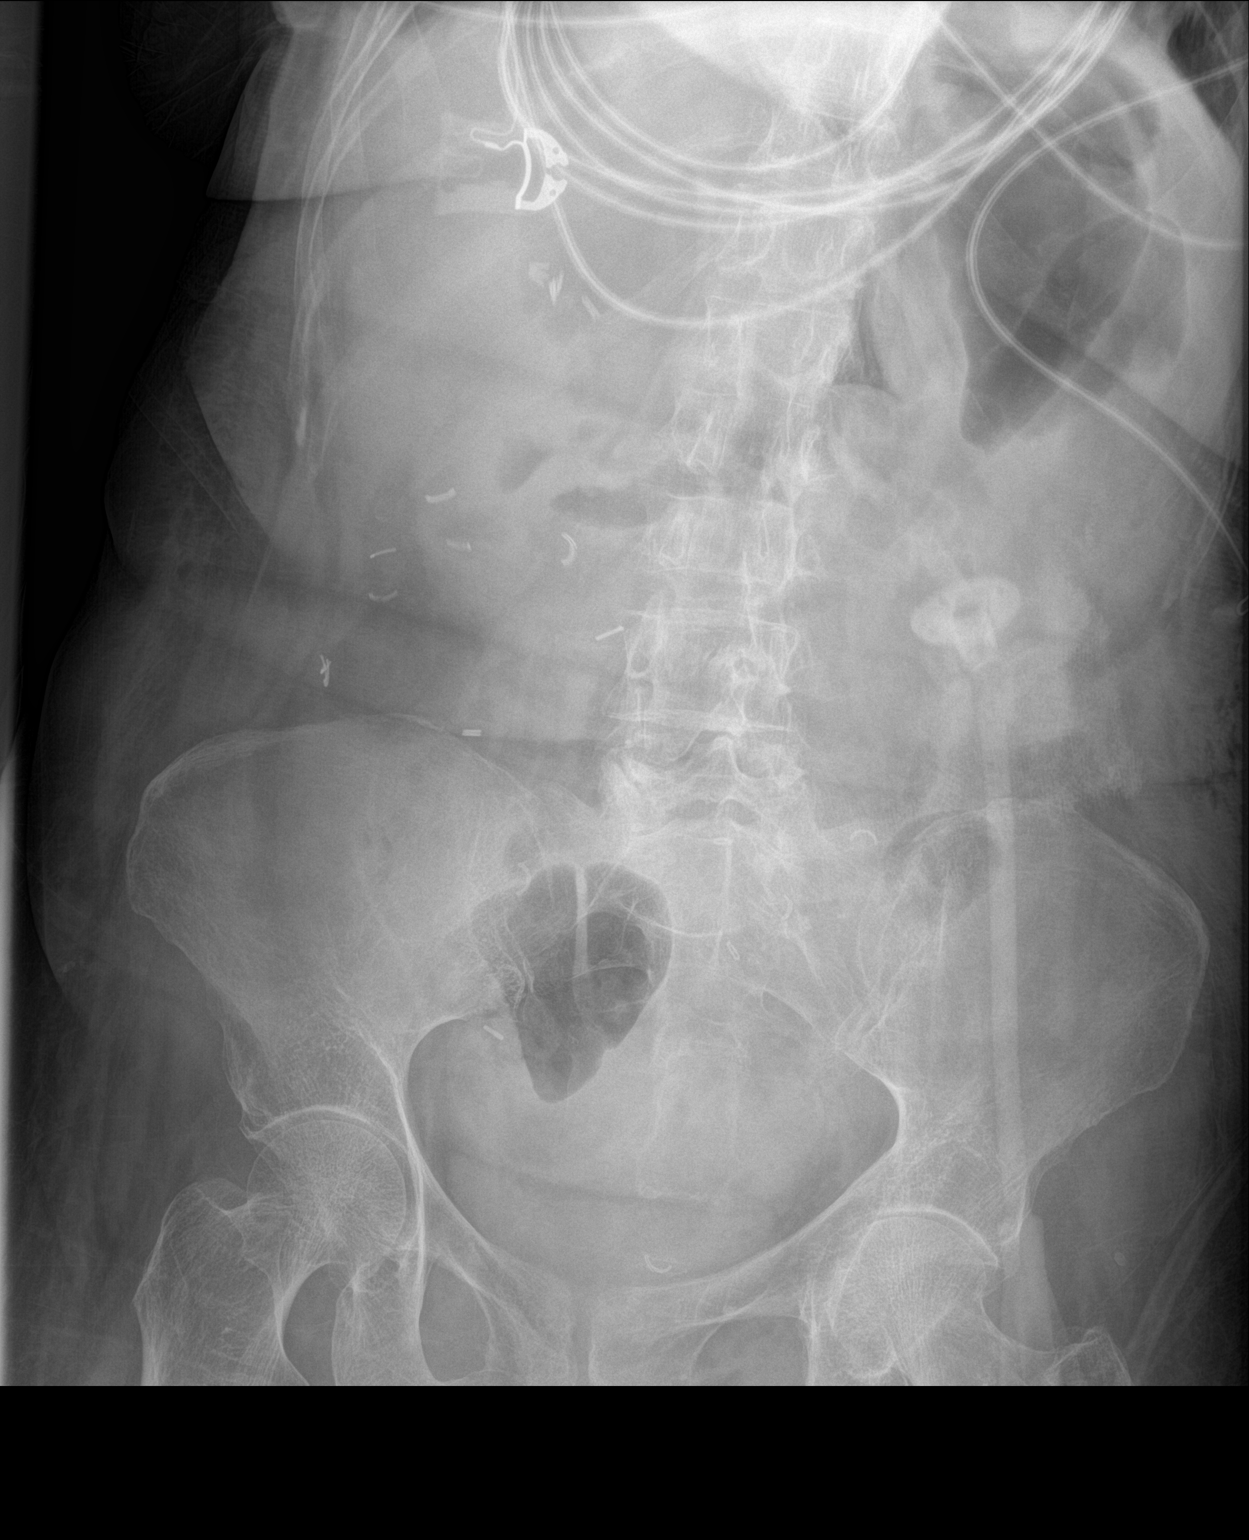

[1 of 1 positions shown; findings below may reference images not displayed]

FINDINGS: Nonobstructive gas pattern. Gastrostomy tube LEFT upper quadrant. No
worrisome osseous findings.
IMPRESSION: As above.

## 2018-09-23 IMAGING — RF DG ESOPHAGUS
4 series · 16 of 16 positions shown · non-contrast
Comparison: 06/10/2017 and chest CT from 05/08/2017

CLINICAL DATA: Hiatal hernia. Recent gastrostomy tube placement.
"Rule out leak."

EXAM:
ESOPHOGRAM/BARIUM SWALLOW
TECHNIQUE: Single contrast examination was performed using water-soluble
contrast.
FLUOROSCOPY TIME:  Fluoroscopy Time:  1.0 minutes
Radiation Exposure Index (if provided by the fluoroscopic device):
10.4
Number of Acquired Spot Images: 0

[Series 1: cp_standard · 0.34mm/px · 4 of 103 frames shown (1 of 4)]
[frame 16/103]
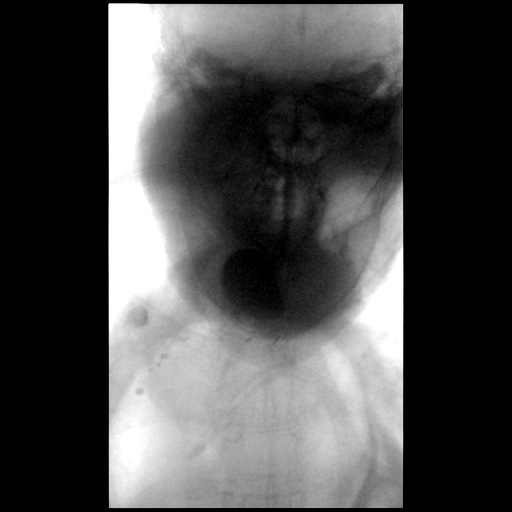
[frame 32/103]
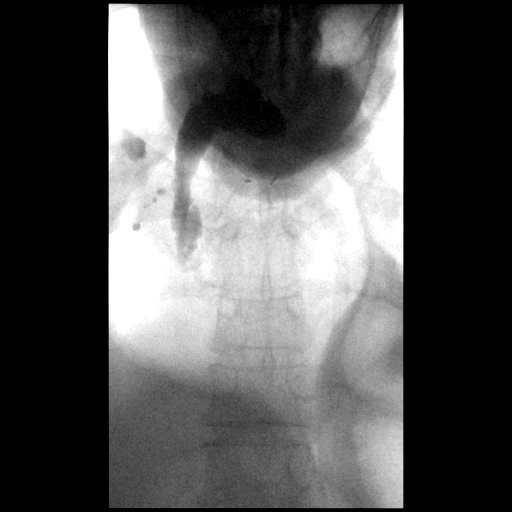
[frame 52/103]
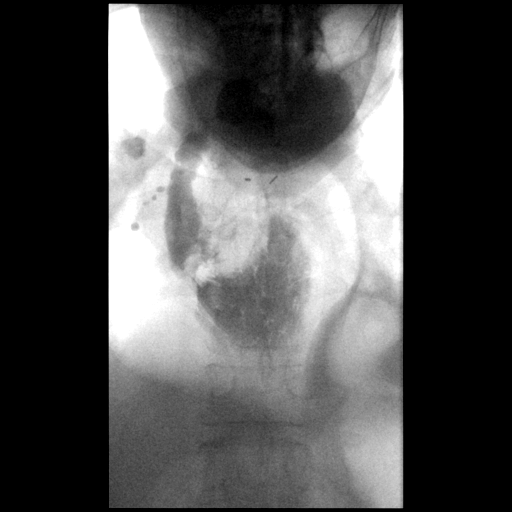
[frame 88/103]
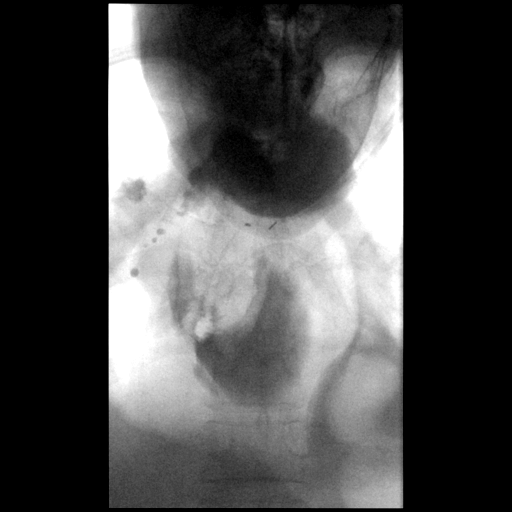

[Series 2: cp_standard · 0.35mm/px · 4 of 73 frames shown (2 of 4)]
[frame 11/73]
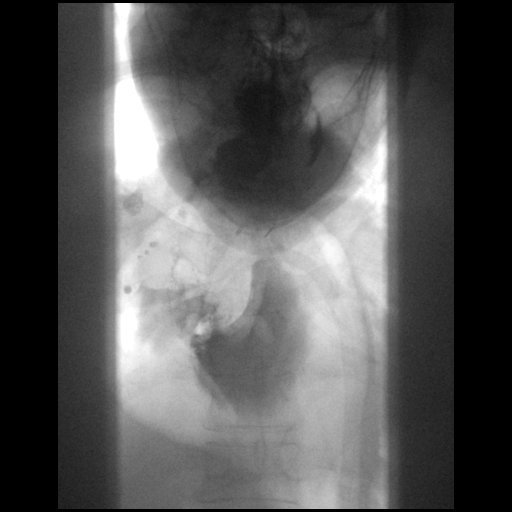
[frame 19/73]
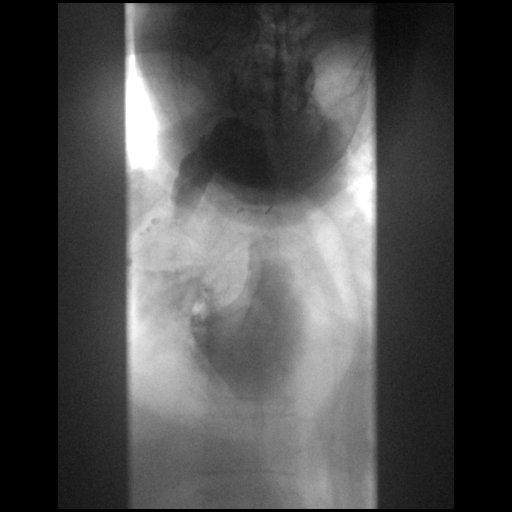
[frame 37/73]
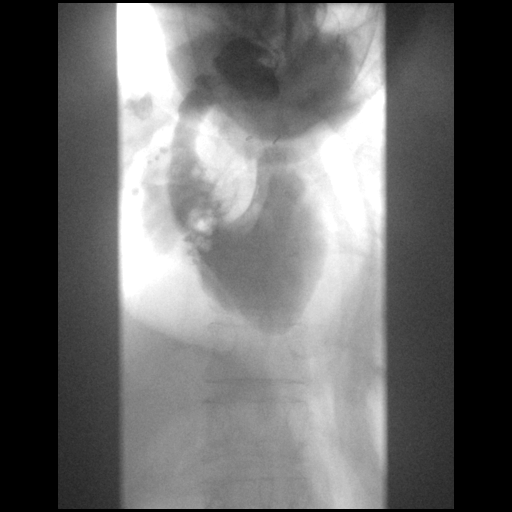
[frame 63/73]
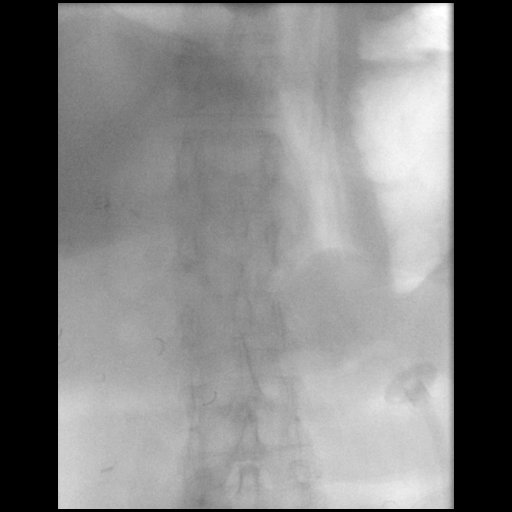

[Series 3: cp_standard · 0.34mm/px · 4 of 31 frames shown (3 of 4)]
[frame 1/31]
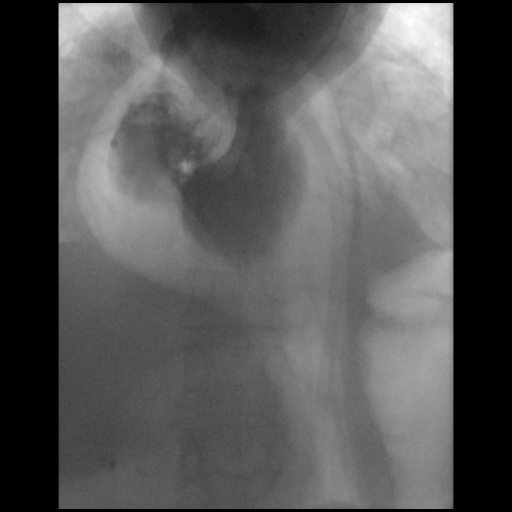
[frame 5/31]
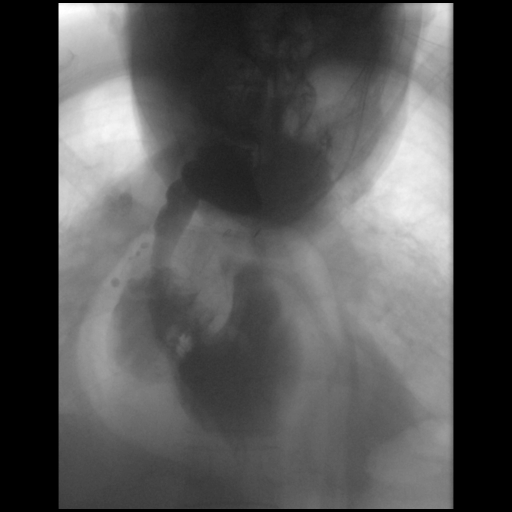
[frame 16/31]
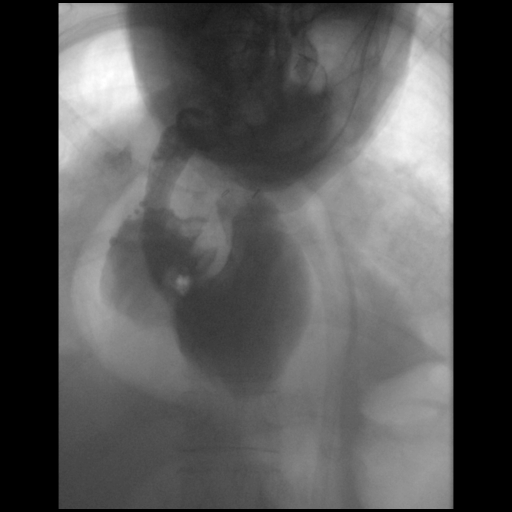
[frame 27/31]
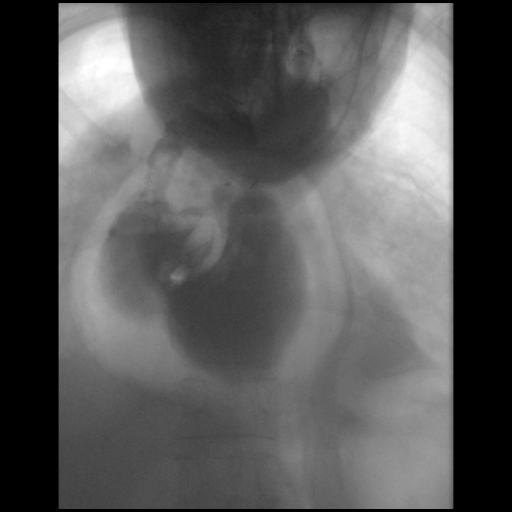

[Series 4: cp_standard · 0.36mm/px · 4 of 29 frames shown (4 of 4)]
[frame 1/29]
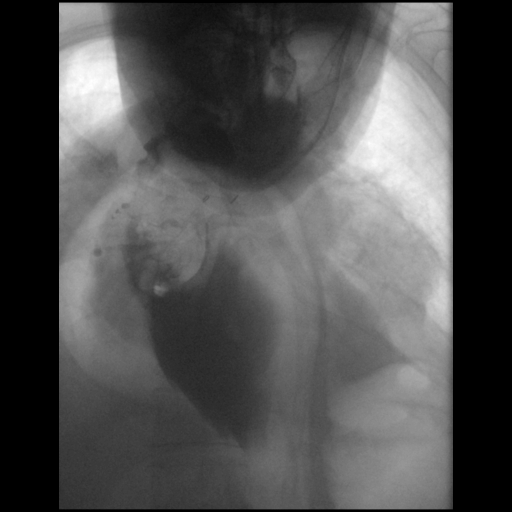
[frame 5/29]
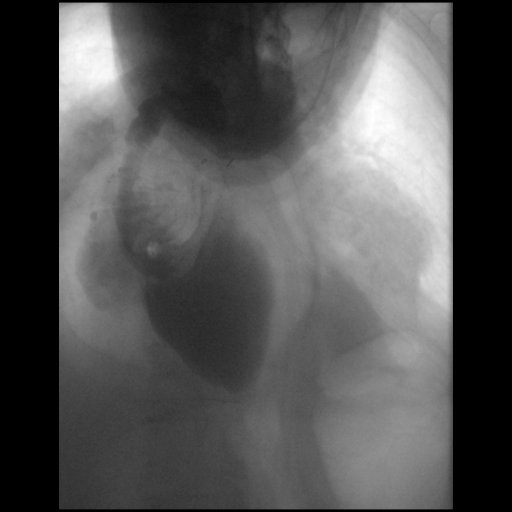
[frame 15/29]
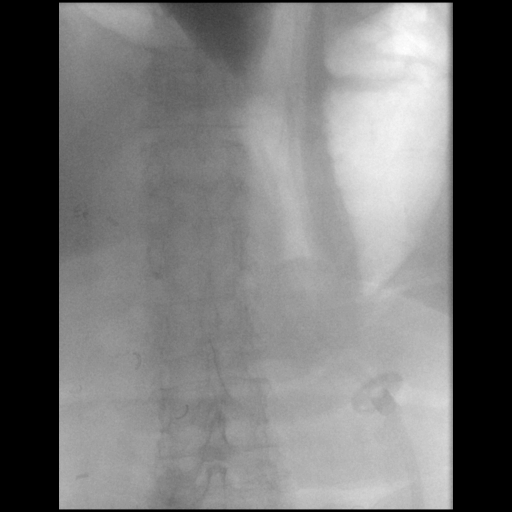
[frame 25/29]
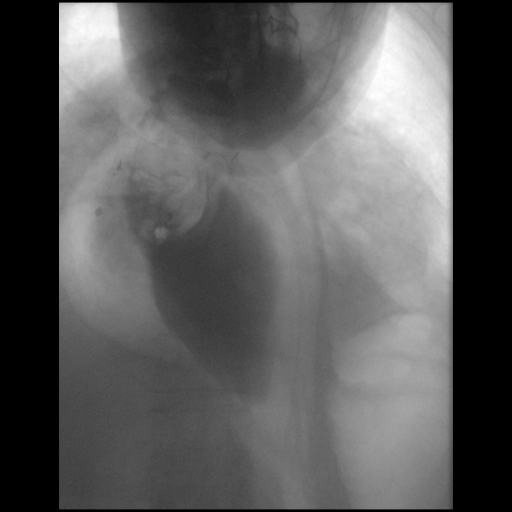

[16 of 16 positions shown; findings below may reference images not displayed]

FINDINGS: The patient tolerated taking 3 small sips of water soluble contrast,
a total of about 20 cc. After that she refused all further imaging.
She would only allow imaging in the supine position hunched forward
about 40 degrees by wedge pillows.

On the swallows, the esophagus is noted to deviate laterally to the
right before emptying into the very large intrathoracic hiatal
hernia at projects over the central chest partially due to the
patient positioning. There is some degree of esophageal dysmotility,
as well as distal esophageal irregularity. Contrast surrounds the
distal esophagus but is probably still within the very large hiatal
hernia, which is distended with gas.

I tilted the table up about 6 or 7 degrees, which is the most that
the patient could tolerate without starting to slide. I was not able
to get contrast to empty down into the intra- abdominal portion of
the stomach. I am able to see a filling defect in the gastric bubble
intra-abdominal portion compatible with the G-tube retention
mechanism.
IMPRESSION: 1. Today's exam does not exclude leak from the esophagus or stomach.
The exam is simply too limited and the patient is not a suitable
candidate for fluoroscopic imaging of this type. Esophagram requires
the patient's ability to turn a different angles and swallow more
than a sip or two of contrast, and this patient's required
positioning today, sitting up about 40 degrees with respect to the
plane of imaging, is particularly unsuitable to fluoroscopic
diagnosis.
2. From what I can tell on the limited assessment achievable, the
esophagus is bunched over to the right and empties into a very large
hiatal hernia containing at least most of the stomach. The contrast
fills along the posterior wall of the hiatal hernia. The esophagus
is irregular distally, and ulceration is not excluded. Chest CT
might be an alternative although I am not certain that the patient
will tolerate laying down for chest CT.

## 2018-09-24 IMAGING — DX DG CHEST 1V PORT
1 series · 1 of 1 positions shown · non-contrast
Comparison: CT chest 09/09/2017.  One-view chest x-ray 09/09/2017.

CLINICAL DATA: Central line placement.

EXAM:
PORTABLE CHEST 1 VIEW

[chest ap]
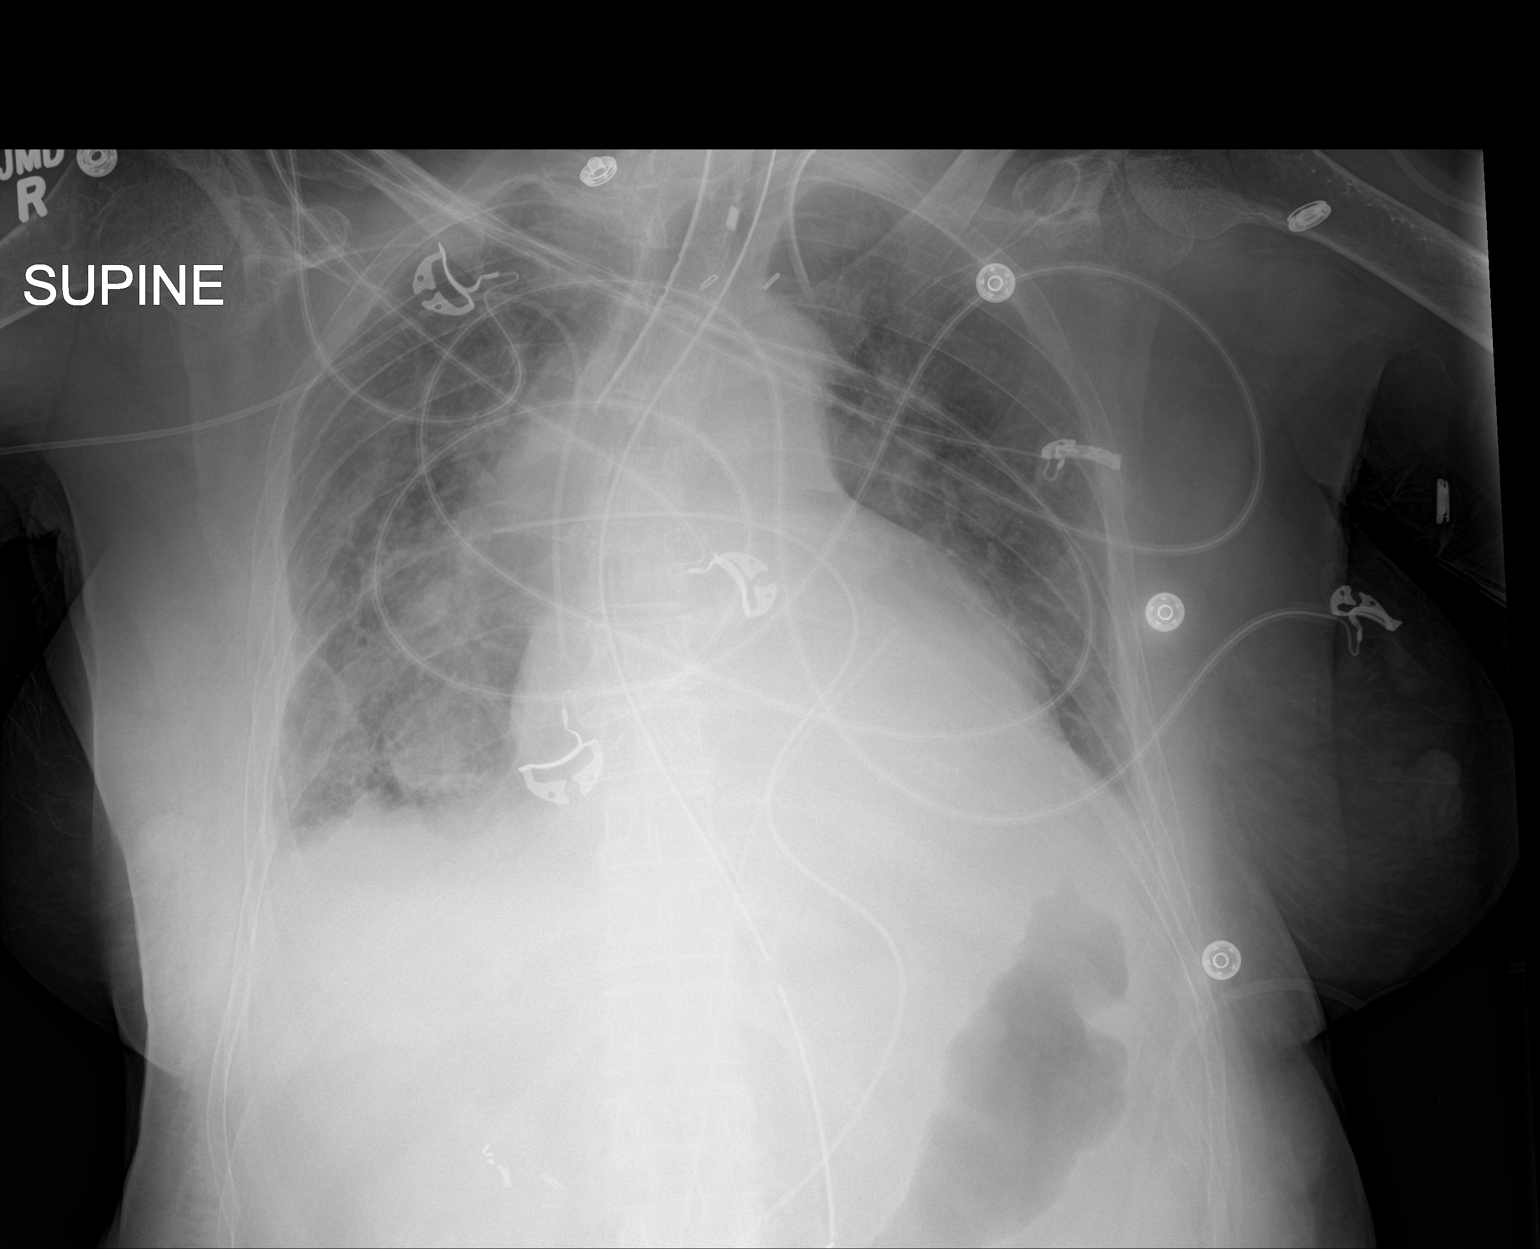

[1 of 1 positions shown; findings below may reference images not displayed]

FINDINGS: Heart is enlarged. Large hiatal hernia is again noted. The patient
has been intubated. The endotracheal tube terminates less than 1 cm
from the carina and could be pulled back to 3 cm for more optimal
positioning. A left IJ line terminates above the aorta. Location is
indeterminate. There is no pneumothorax. An NG tube is in place with
decompression of the stomach and large hiatal hernia. Bilateral
pleural effusions and basilar airspace disease are again noted.
Pulmonary vascular congestion has increased.
IMPRESSION: 1. Vascular catheter within the left-sided neck terminates above the
aorta. Position is indeterminate.
2. Endotracheal tube terminates less than 1 cm from the carina.
3. Interval decompression of stomach enlarged hiatal hernia
following placement of NG tube.
4. Bilateral pleural effusions and associated airspace disease,
likely atelectasis.
These results were called by telephone at the time of interpretation
on 09/10/2017 at [DATE] to Dr. JULLIAN SCHALLER , who verbally
acknowledged these results.

## 2018-09-25 IMAGING — DX DG CHEST 1V PORT
1 series · 1 of 1 positions shown · non-contrast
Comparison: 09/10/2017.  09/09/2017.

CLINICAL DATA: Acute respiratory failure.  Ventilator support.

EXAM:
PORTABLE CHEST 1 VIEW

[chest ap]
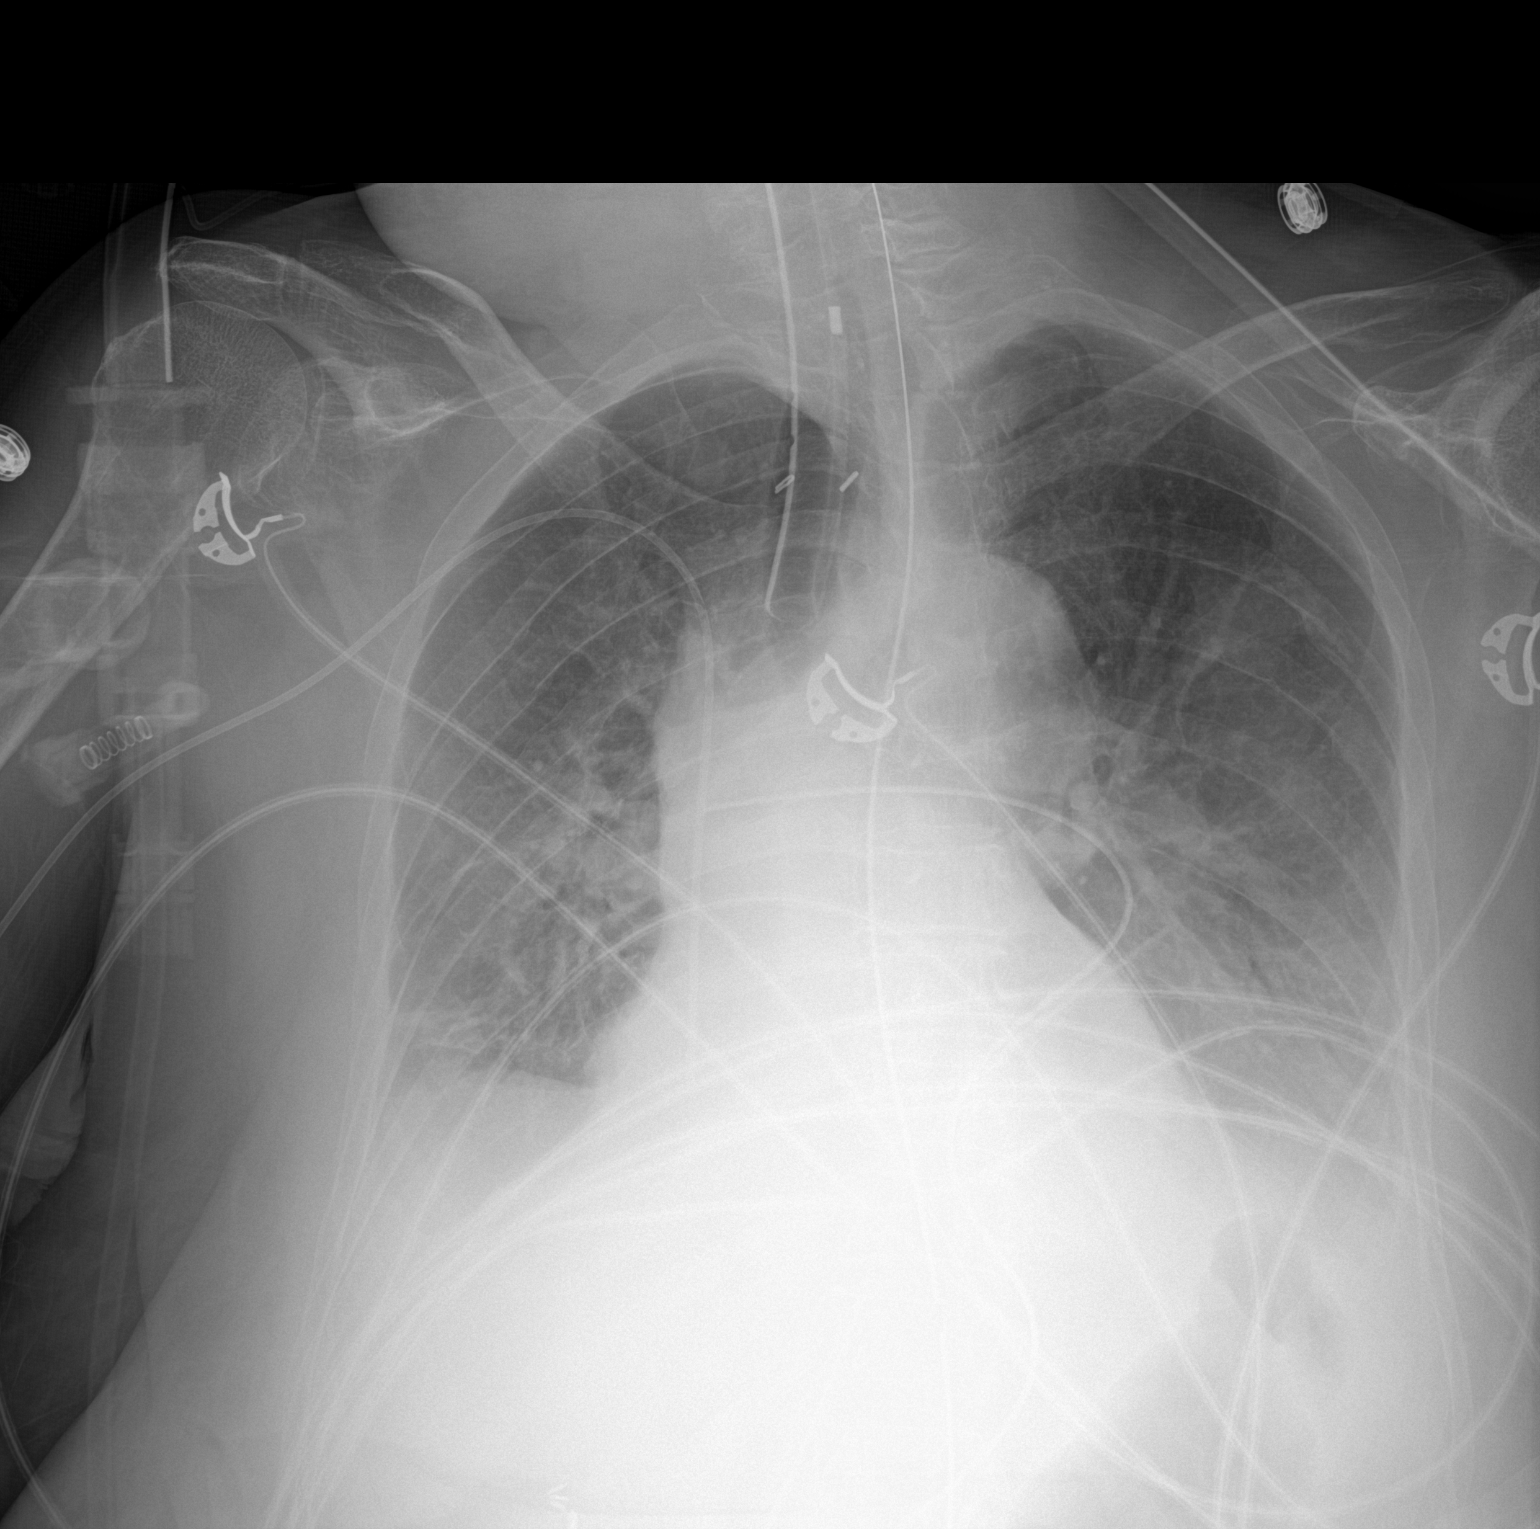

[1 of 1 positions shown; findings below may reference images not displayed]

FINDINGS: Endotracheal tube is 1 cm above the carina. Nasogastric tube enters
the abdomen. Right arm PICC tip is in the right atrium. Surgical
drain tubes overlie the chest and abdomen. Persistent
atelectasis/infiltrate in both lower lobes.
IMPRESSION: Lines and tubes well positioned. Mild lower lobe
atelectasis/infiltrate following thoracic and abdominal surgery.

## 2018-09-28 IMAGING — DX DG CHEST 1V PORT
1 series · 1 of 1 positions shown · non-contrast
Comparison: 09/11/2017.

CLINICAL DATA: Respiratory failure.

EXAM:
PORTABLE CHEST 1 VIEW

[chest ap]
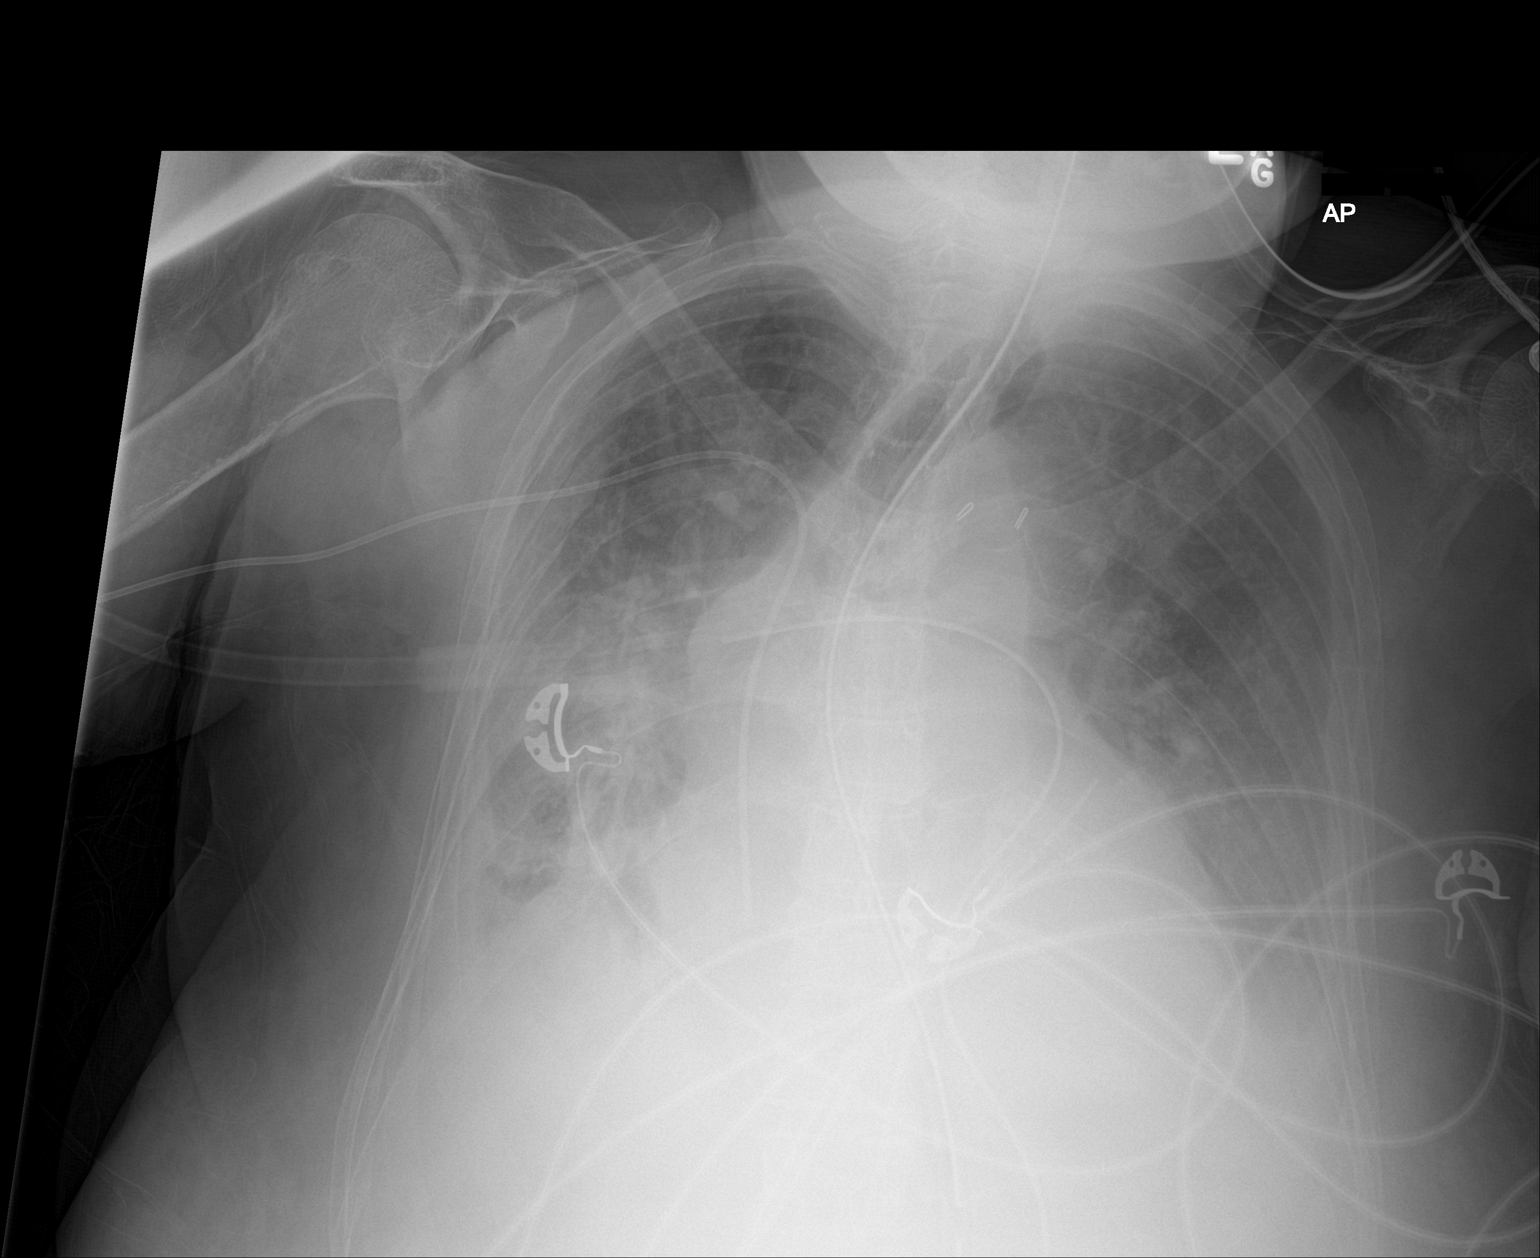

[1 of 1 positions shown; findings below may reference images not displayed]

FINDINGS: Interim extubation. NG tube tip in the upper portion of the stomach.
Right PICC line in unchanged position with tip in right atrium.
Cardiomegaly. Diffuse bilateral pulmonary infiltrates consistent
pulmonary edema noted on today's exam. Bilateral pleural effusions.
Findings consistent CHF. No pneumothorax.
IMPRESSION: 1. Interim extubation. NG tube tip noted in the upper portion
stomach. Right PICC line tip noted in unchanged position in the
right atrium. 2. Cardiomegaly with diffuse pulmonary infiltrates
consistent with pulmonary edema noted on today's exam. Bilateral
pleural effusions.

## 2018-09-28 IMAGING — DX DG ABD PORTABLE 1V
1 series · 1 of 1 positions shown · non-contrast
Comparison: 09/09/2017

CLINICAL DATA: Ileus

EXAM:
PORTABLE ABDOMEN - 1 VIEW

[abdomen kub]
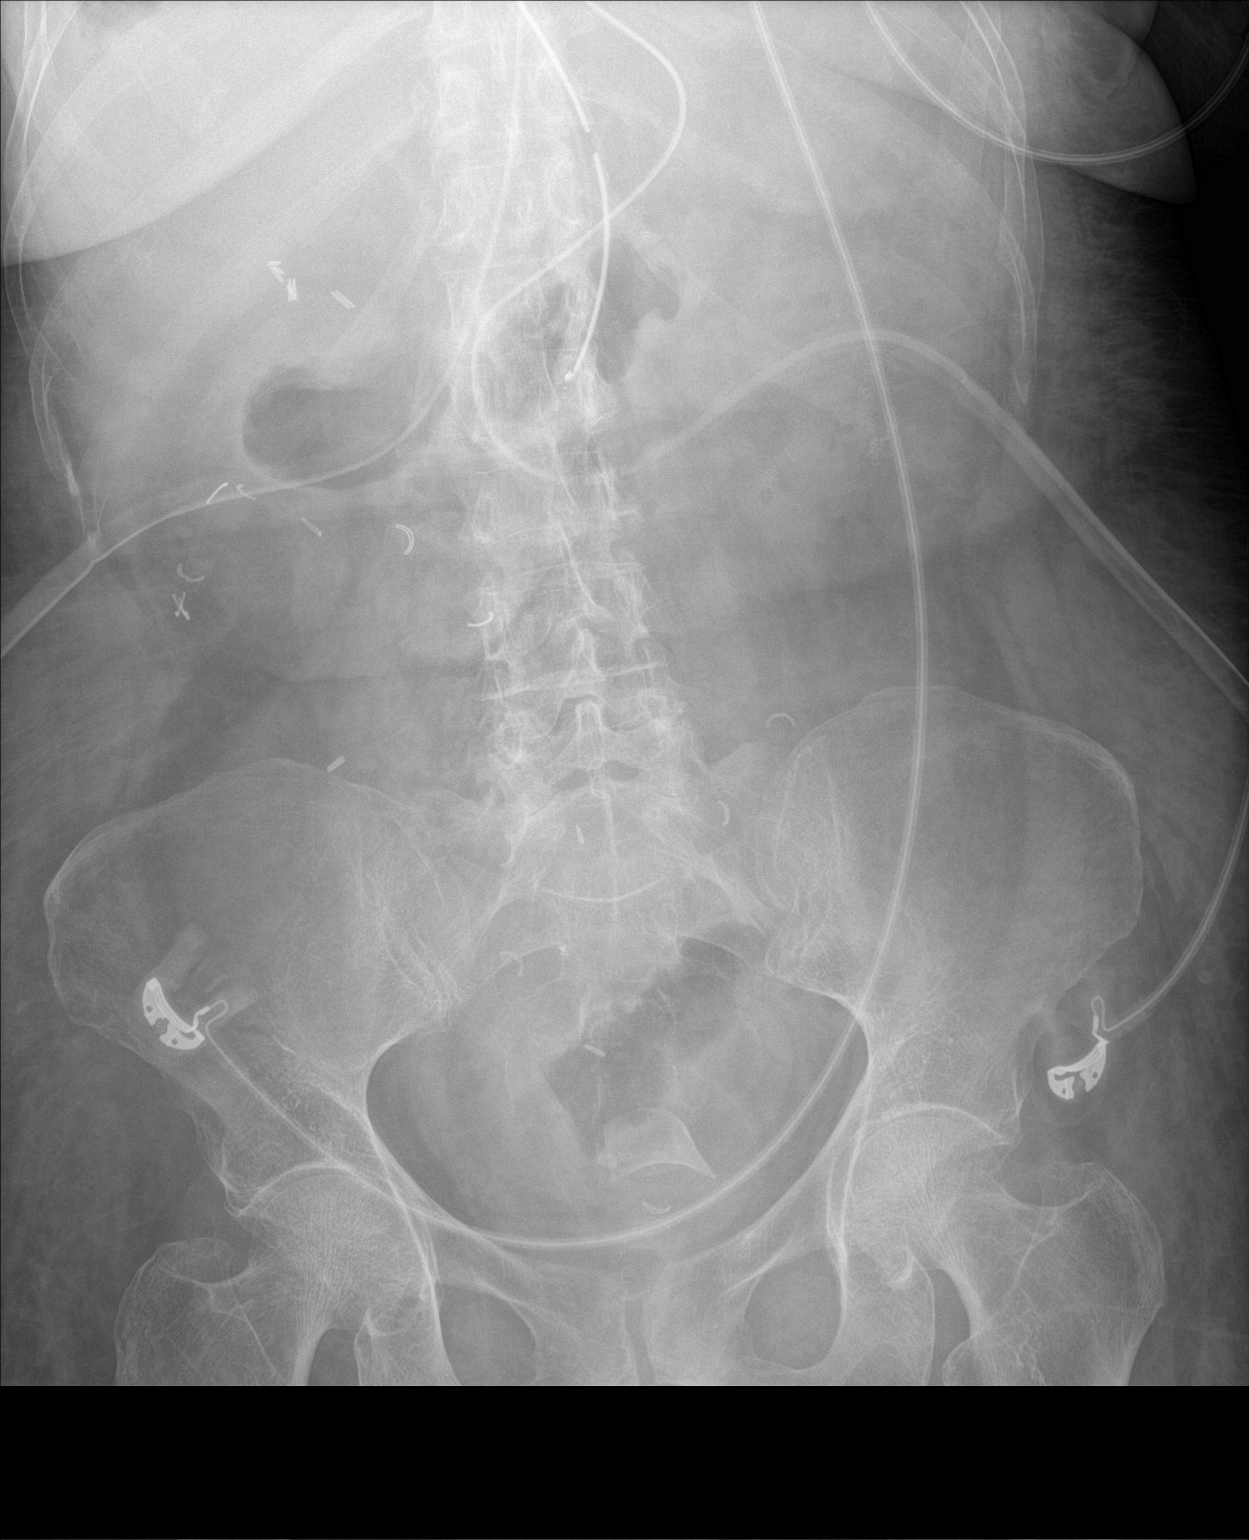

[1 of 1 positions shown; findings below may reference images not displayed]

FINDINGS: NG tube tip projects over the mid stomach. Nonobstructive bowel gas
pattern. No free air organomegaly.
IMPRESSION: No evidence of bowel obstruction or ileus.

## 2018-09-30 IMAGING — CT CT ABD-PELV W/ CM
2 of 5 series · 15 of 46 positions shown, 17 images · IV contrast (APPLIED)
Comparison: 09/09/2017

CLINICAL DATA: admitted for surgical intervention of hiatal hernia.
She had laparoscopic gastropexy, lysis of adhesions, and G tube
placement. Developed septic shock and VDRF post op from
pneumoperitoneum with purulent peritonitis with concern for G tube
leak.HX crohns, hiatal hernia, bowel resection, appendectomy,
cholecystectomy

EXAM:
CT ABDOMEN AND PELVIS WITH CONTRAST
TECHNIQUE: Multidetector CT imaging of the abdomen and pelvis was performed
using the standard protocol following bolus administration of
intravenous contrast.
CONTRAST:  80mL [id number] IOPAMIDOL ([id number]) INJECTION 61%

[Series 2: axial st · axial · 0.80mm/px · z∈[-446,-51]mm · 12 of 95 slices shown, 14 images]
[im 8/95  soft-tissue]
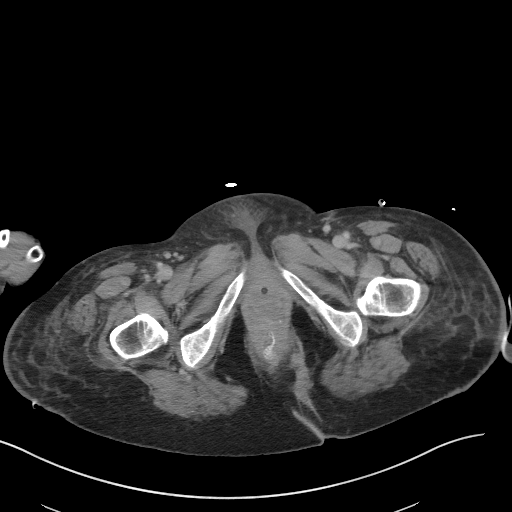
[im 8/95  bone]
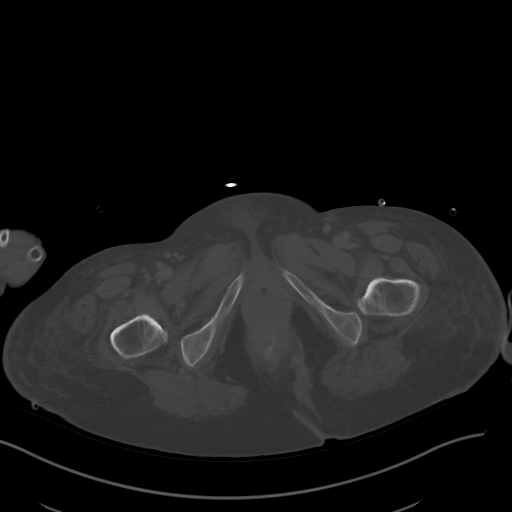
[im 15/95  soft-tissue]
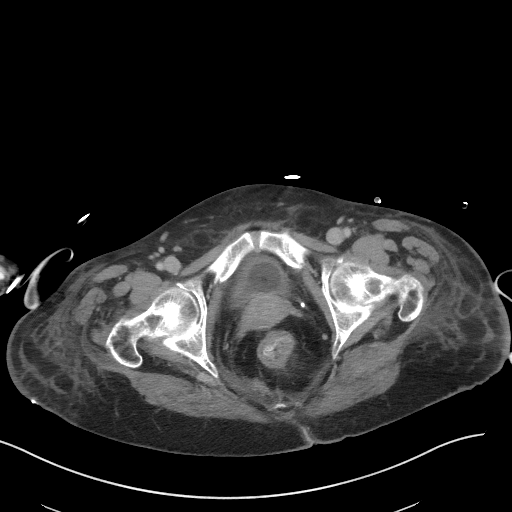
[im 22/95  soft-tissue]
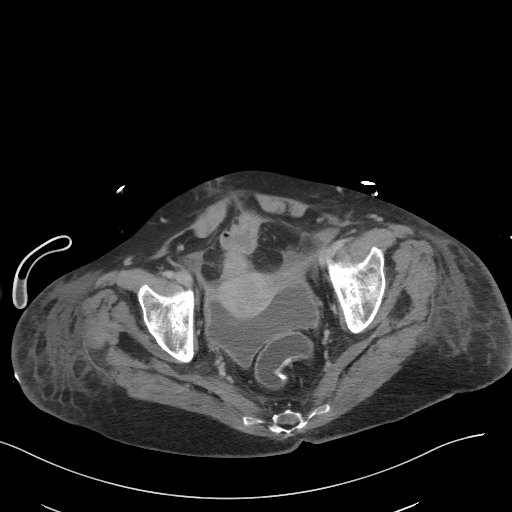
[im 29/95  soft-tissue]
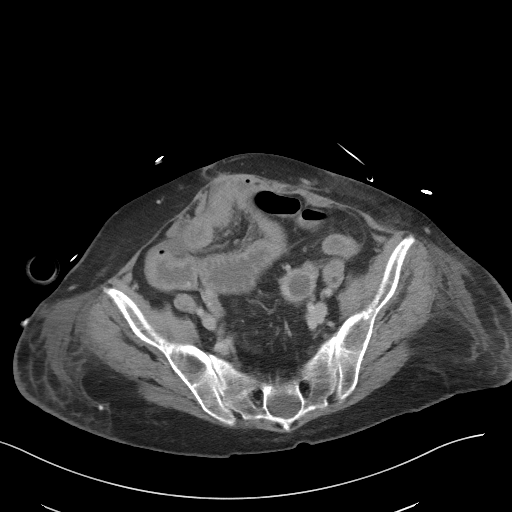
[im 37/95  soft-tissue]
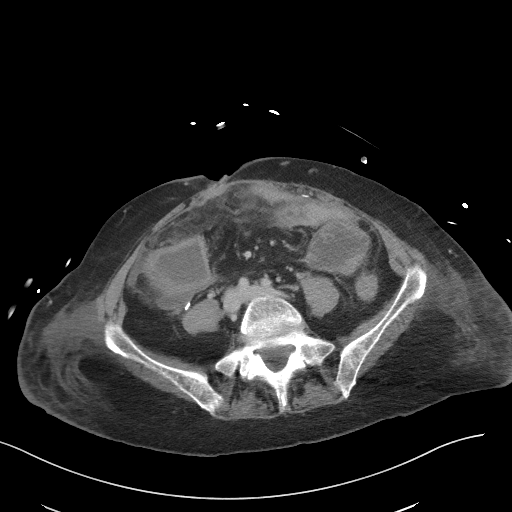
[im 44/95  soft-tissue]
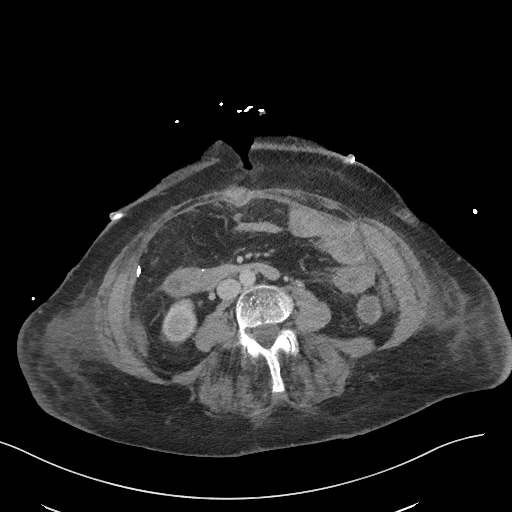
[im 51/95  soft-tissue]
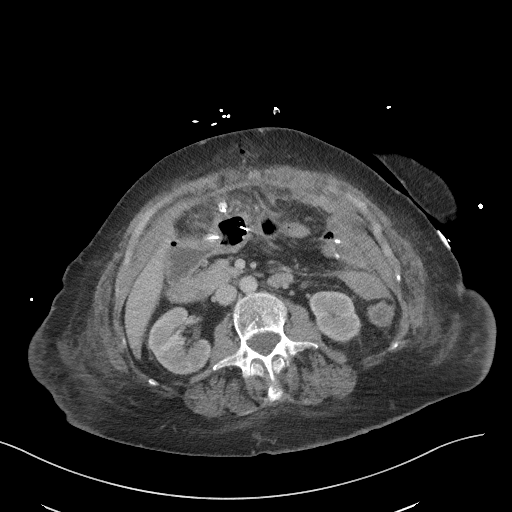
[im 58/95  soft-tissue]
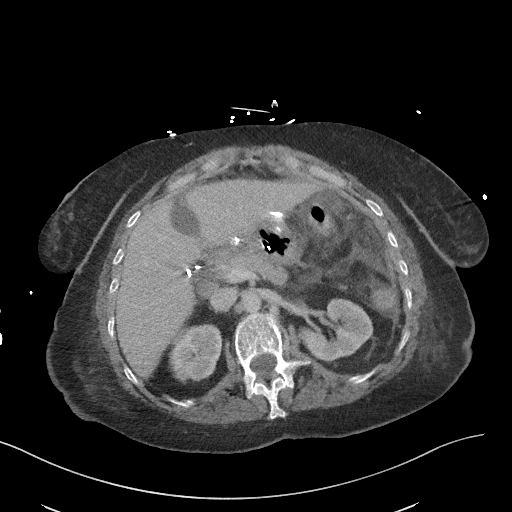
[im 66/95  soft-tissue]
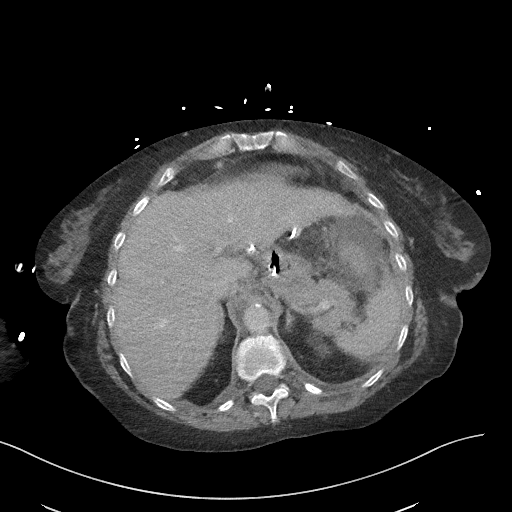
[im 66/95  bone]
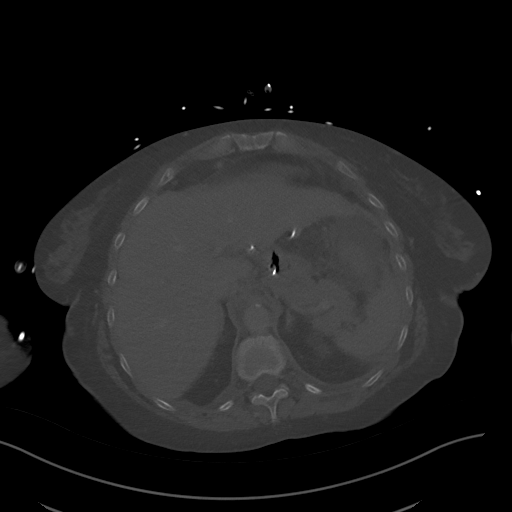
[im 73/95  soft-tissue]
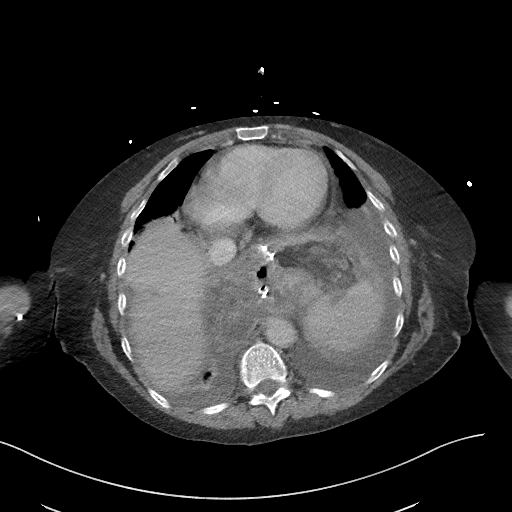
[im 80/95  soft-tissue]
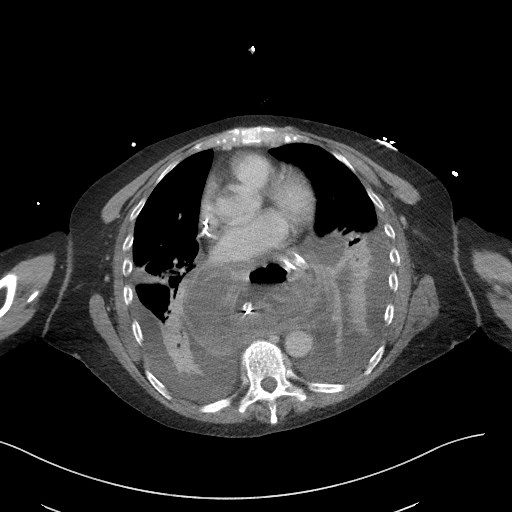
[im 87/95  soft-tissue]
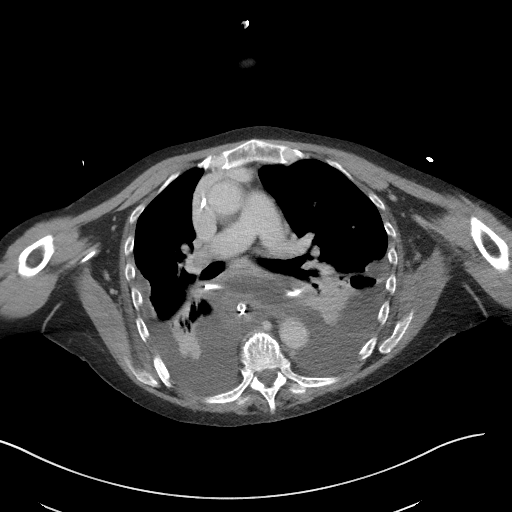

[Series 5: coronal st · coronal · 0.70mm/px · 3 of 100 slices shown]
[im 34/100  soft-tissue]
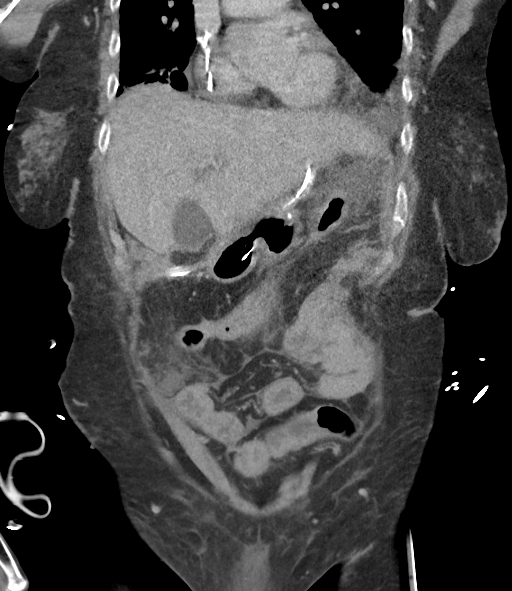
[im 45/100  soft-tissue]
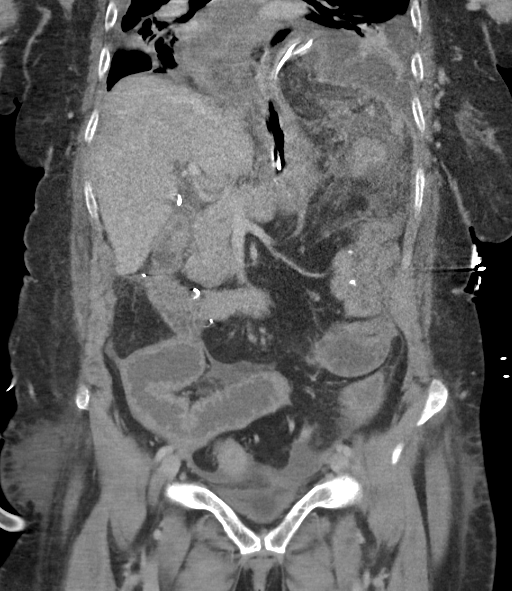
[im 56/100  soft-tissue]
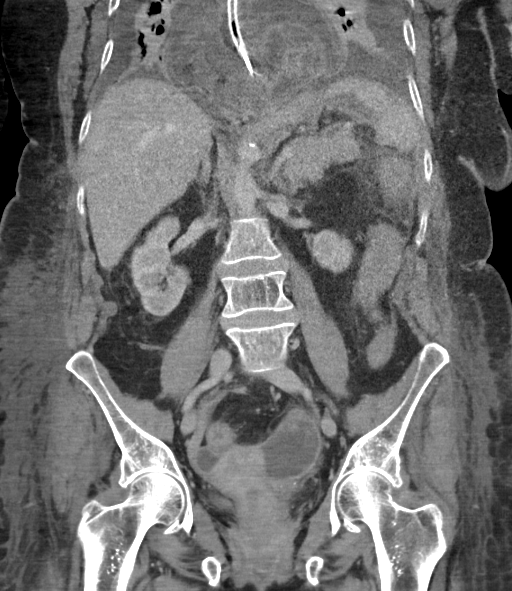

[15 of 46 positions shown; findings below may reference images not displayed]

FINDINGS: Lower chest: Moderate bilateral pleural effusions. Extensive
atelectasis/ consolidation in the dependent aspect of both lower
lobes. Right subclavian central line to the cavoatrial junction.
Large hiatal hernia containing gastric fundus and a large amount of
mesenteric fat, with surrounding fluid which may be ascites or
hemorrhage. Nasogastric tube in place. Two surgical drains from the
abdomen extend into the hiatal hernia region.

Hepatobiliary: Liver unremarkable. Cholecystectomy clips. 4.4 x2.5
cm fluid collection at inferior aspect of the falciform ligament,
without surrounding enhancement. No intraparenchymal liver lesion.
No biliary ductal dilatation.

Pancreas: Unremarkable. No pancreatic ductal dilatation or
surrounding inflammatory changes.

Spleen: Normal in size without focal abnormality.

Adrenals/Urinary Tract: Normal adrenals. Kidneys unremarkable. Foley
catheter decompresses the urinary bladder.

Stomach/Bowel: Interval removal of gastrostomy tube. Stomach is
decompressed, the fundus extending into the hiatal hernia as above.
Proximal small bowel decompressed. There is some distended
fluid-filled loops of distal small bowel long-term including
terminal ileum showing circumferential mild wall thickening. There
is contiguous circumferential wall thickening in the proximal
ascending colon, also segmentally in the splenic flexure of the
colon. There is moderate inflammatory/edematous change around the
splenic flexure without abscess. Colon is nondilated. Rectal tube in
place.

Vascular/Lymphatic: No significant vascular findings are present. No
enlarged abdominal or pelvic lymph nodes.

Reproductive: Status post hysterectomy. No adnexal masses.

Other: Loculated peripherally enhancing fluid collection in the
cul-de-sac measured approximately 8.7 x 2.1 cm maximum transverse
dimensions. There is scattered fluid among leaves of the mesentery.
No free air.

Musculoskeletal: Stable mild L1 compression deformity. No acute
fracture or worrisome bone lesion. Tarlov cyst in the sacrum
incidentally noted.
IMPRESSION: 1. Complex postop fluid collection in the large hiatal hernia may
represent ascites and edematous mesentery versus hemorrhage.
2. 8.7 cm peripherally enhancing cul-de-sac fluid collection,
possible abscess.
3. Circumferential wall thickening in distal ileum, proximal
ascending colon, and splenic flexure with surrounding
inflammatory/edematous changes consistent with colitis.
4. Increase in bilateral pleural effusions.

## 2018-10-01 IMAGING — CT CT IMAGE GUIDED DRAINAGE BY PERCUTANEOUS CATHETER
1 of 2 series · 13 of 32 positions shown, 18 images · non-contrast
Comparison: none

CLINICAL DATA: Status post abdominal surgery with development of
peritonitis. Worsening leukocytosis with cul de sac pelvic abscess.

[Series 3: i-spiral 5.0 b31f · axial · 0.98mm/px · z∈[-206,-24]mm · 13 of 60 slices shown, 18 images]
[im 4/60  soft-tissue]
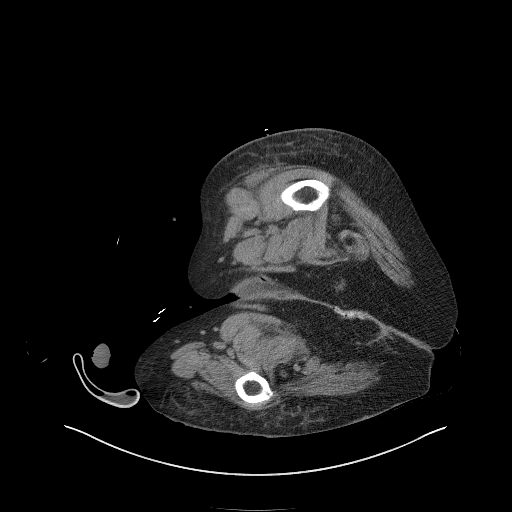
[im 4/60  bone]
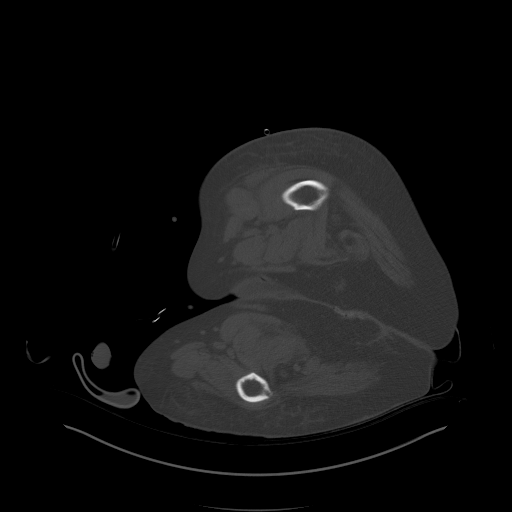
[im 11/60  soft-tissue]
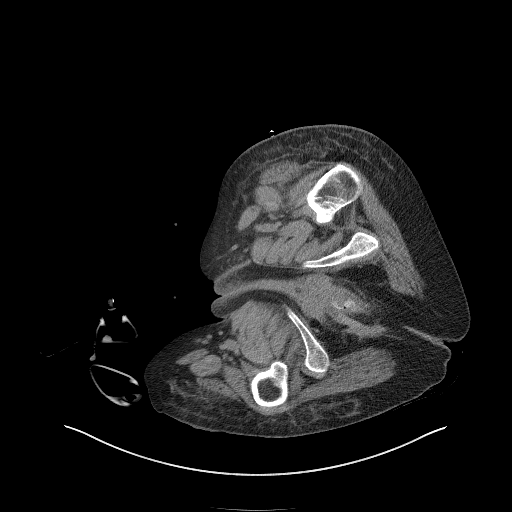
[im 14/60  soft-tissue]
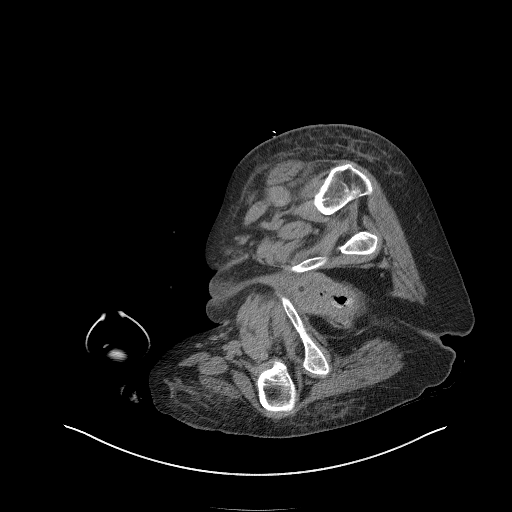
[im 18/60  soft-tissue]
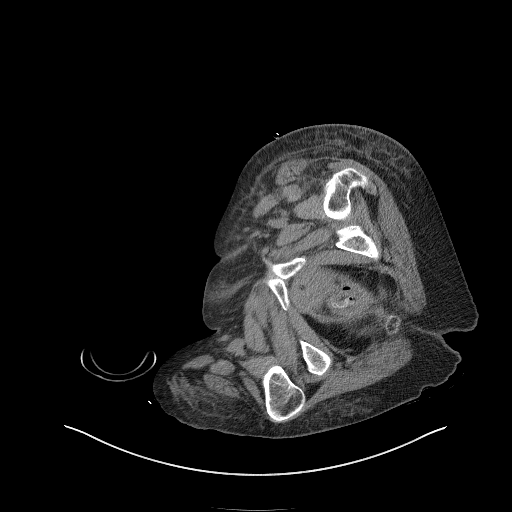
[im 25/60  soft-tissue]
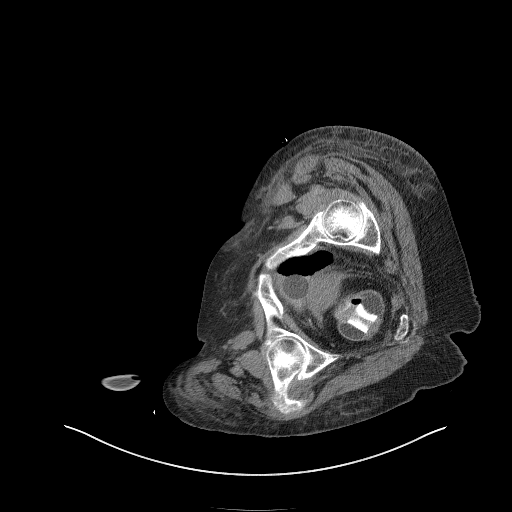
[im 28/60  soft-tissue]
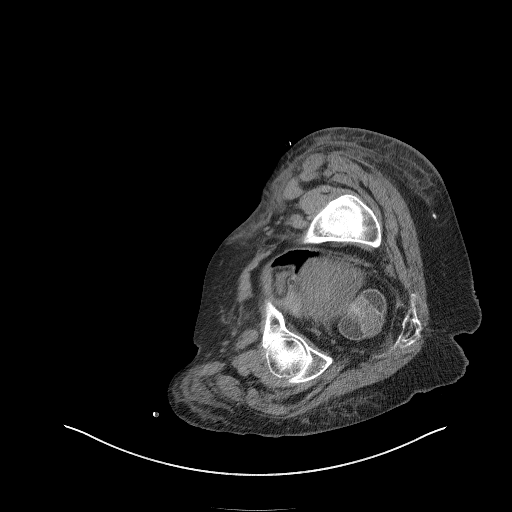
[im 32/60  soft-tissue]
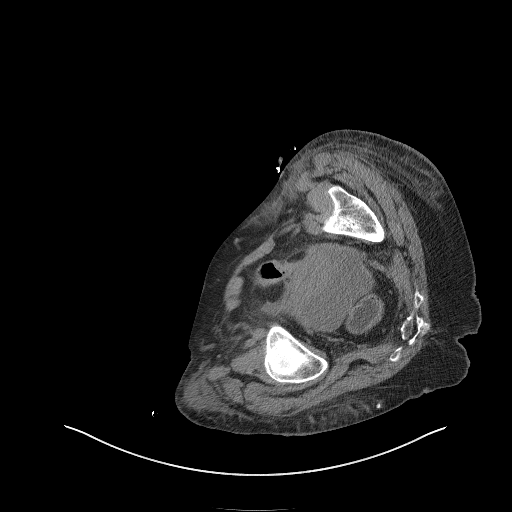
[im 39/60  soft-tissue]
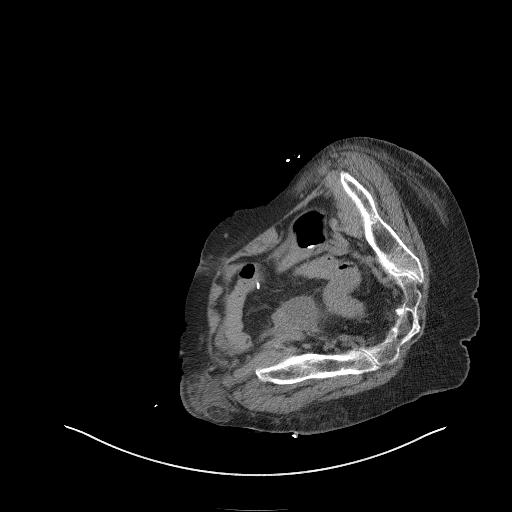
[im 42/60  soft-tissue]
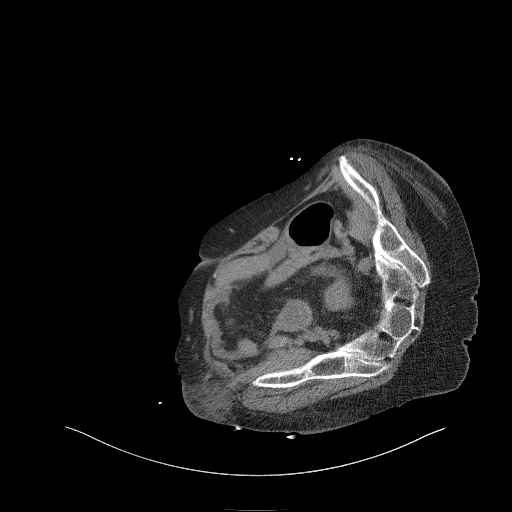
[im 42/60  bone]
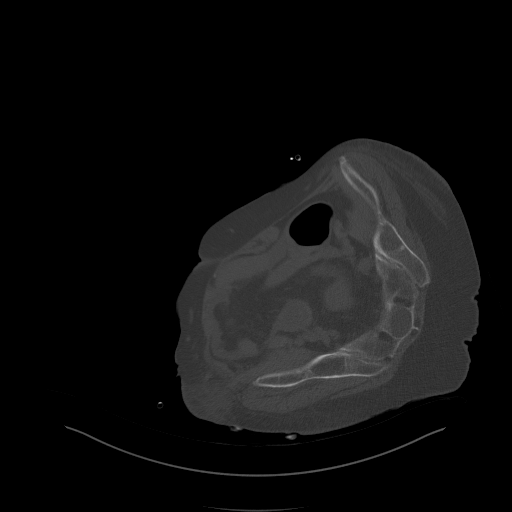
[im 46/60  soft-tissue]
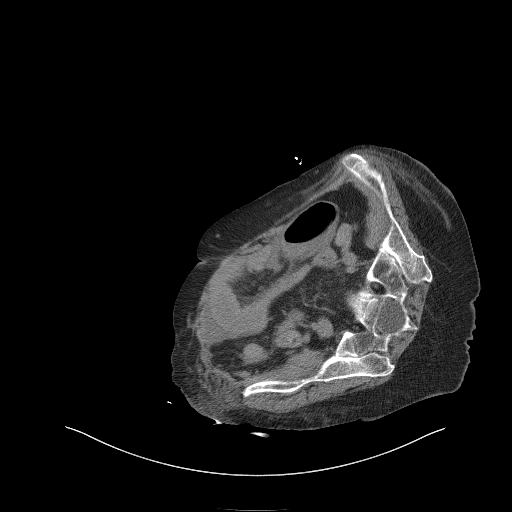
[im 46/60  lung]
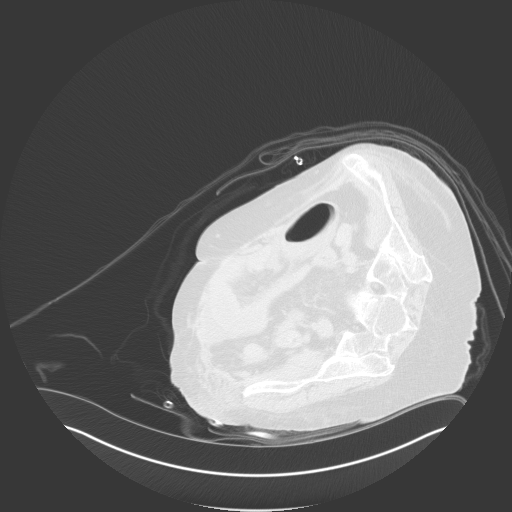
[im 49/60  lung]
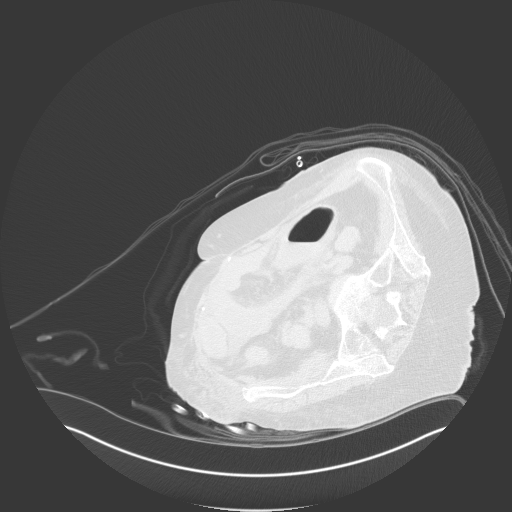
[im 53/60  soft-tissue]
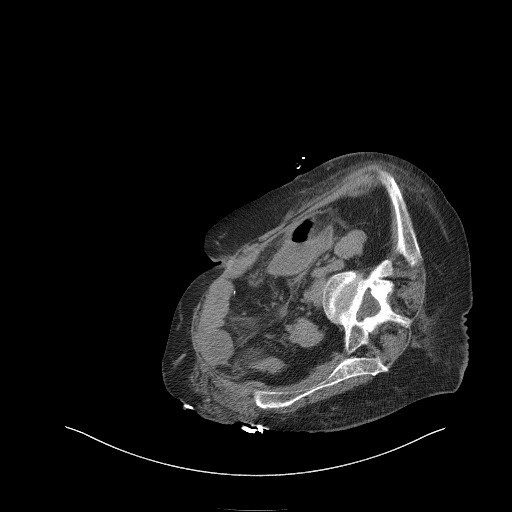
[im 53/60  lung]
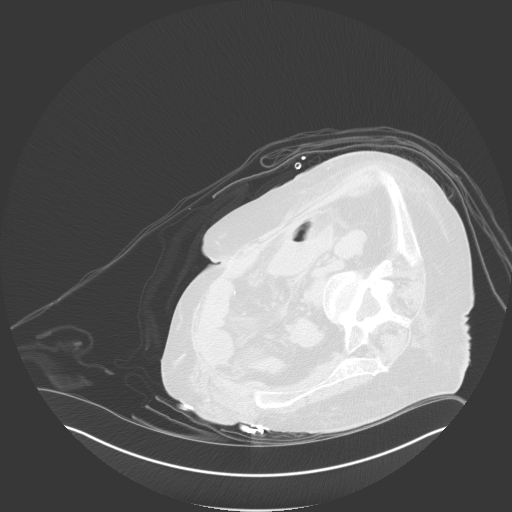
[im 56/60  soft-tissue]
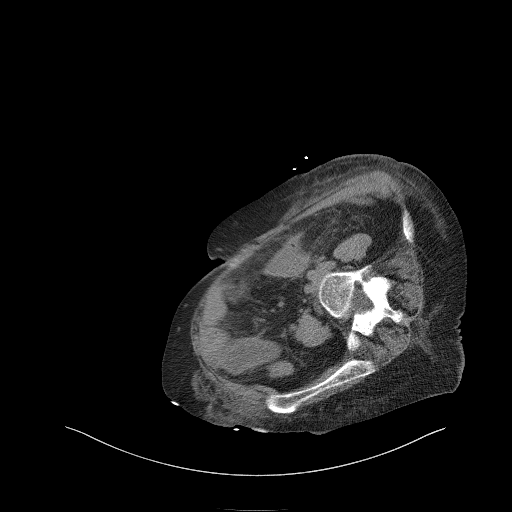
[im 56/60  lung]
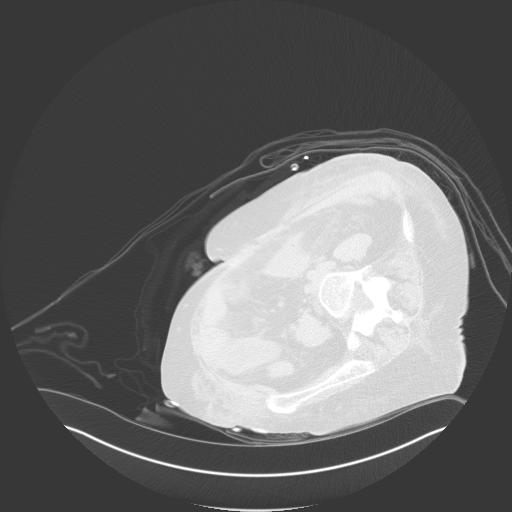

[13 of 32 positions shown; findings below may reference images not displayed]

EXAM:
CT GUIDED CATHETER DRAINAGE OF PELVIC PERITONEAL ABSCESS

ANESTHESIA/SEDATION:
3.0 mg IV Versed 100 mcg IV Fentanyl

Total Moderate Sedation Time:  11 minutes

The patient's level of consciousness and physiologic status were
continuously monitored during the procedure by Radiology nursing.

PROCEDURE:
The procedure, risks, benefits, and alternatives were explained to
the patient. Questions regarding the procedure were encouraged and
answered. The patient understands and consents to the procedure. A
time out was performed prior to initiating the procedure.

The right transgluteal region was prepped with chlorhexidine in a
sterile fashion, and a sterile drape was applied covering the
operative field. A sterile gown and sterile gloves were used for the
procedure. Local anesthesia was provided with 1% Lidocaine.

The patient was placed in a near decubitus position with the right
side up. From a posterior transgluteal approach, an 18 gauge trocar
needle was advanced to the level of a pelvic abscess situated
posterior to the bladder and anterior to the rectum. After needle
entry, aspiration was performed and a fluid sample sent for culture
analysis. A guidewire was advanced into the collection. The tract
was dilated and a 10 French percutaneous drainage catheter placed.
Catheter position was confirmed by CT. The catheter was flushed and
connected to a suction bulb. The catheter was secured at the skin
with a Prolene retention suture and StatLock device.

COMPLICATIONS:
None
FINDINGS: Aspiration at the level of the pelvic fluid collection yielded
bloody and slightly turbid fluid. A sample was sent for culture
analysis. After drain placement, there is excellent return of fluid.
IMPRESSION: CT-guided percutaneous catheter drainage of pelvic abscess from a
posterior right transgluteal approach. There was return of bloody
and turbid fluid with a sample sent for culture analysis. A 10
French drain was placed and attached to suction bulb drainage.

## 2018-10-08 IMAGING — CT CT ABD-PELV W/ CM
2 of 4 series · 15 of 46 positions shown, 17 images · IV contrast (iopamidol)
Comparison: CT scan of September 16, 2017.

CLINICAL DATA: Status post pelvic abscess drainage.

EXAM:
CT ABDOMEN AND PELVIS WITH CONTRAST
TECHNIQUE: Multidetector CT imaging of the abdomen and pelvis was performed
using the standard protocol following bolus administration of
intravenous contrast.
CONTRAST:  100mL 2XQUVO-XRR IOPAMIDOL (2XQUVO-XRR) INJECTION 61%

[Series 3: axial st · axial · 0.78mm/px · z∈[-517,-132]mm · 12 of 89 slices shown, 14 images]
[im 6/89  soft-tissue]
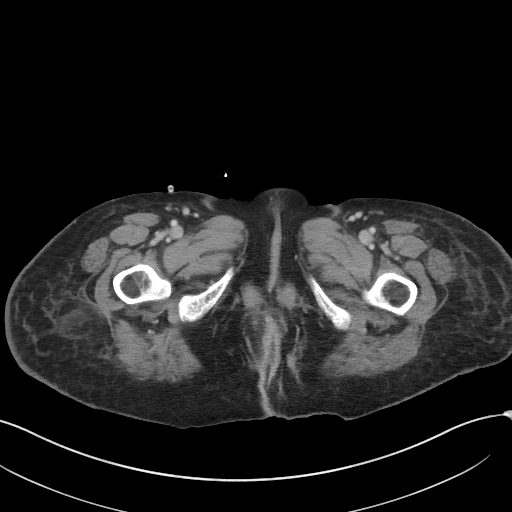
[im 6/89  bone]
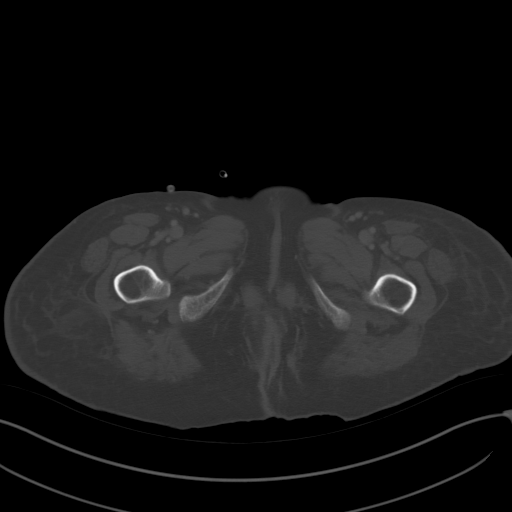
[im 16/89  soft-tissue]
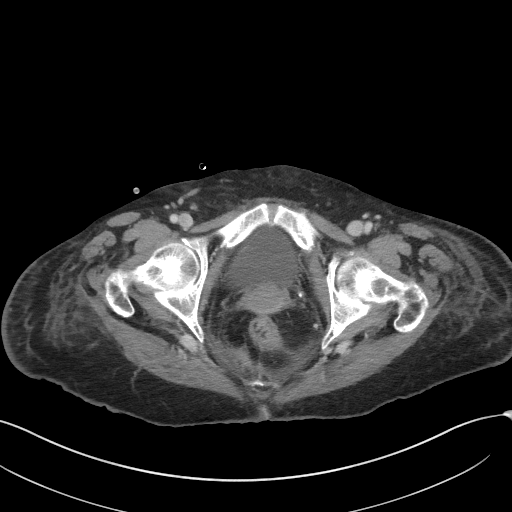
[im 21/89  soft-tissue]
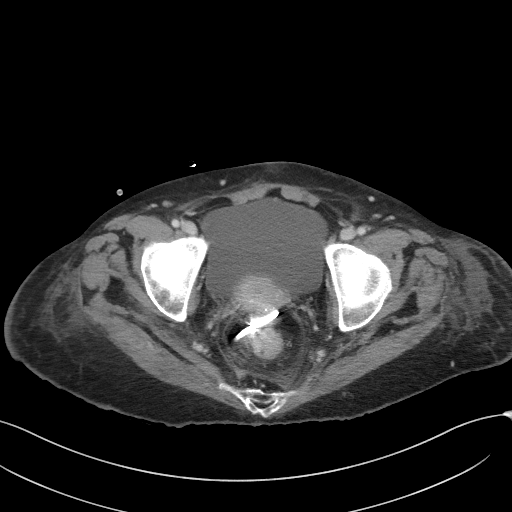
[im 26/89  soft-tissue]
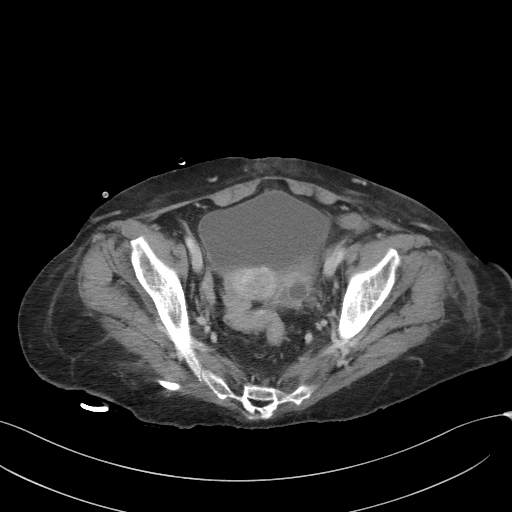
[im 37/89  soft-tissue]
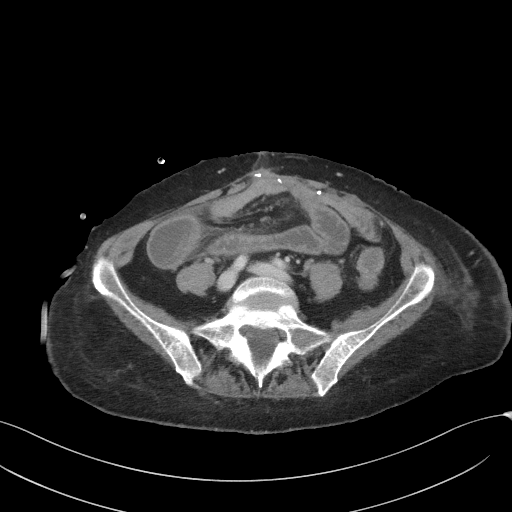
[im 42/89  soft-tissue]
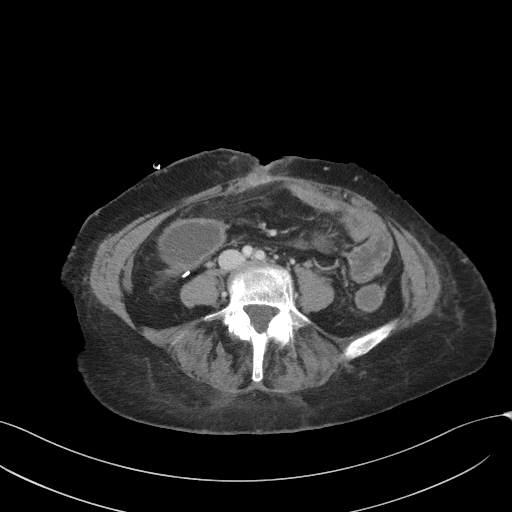
[im 47/89  soft-tissue]
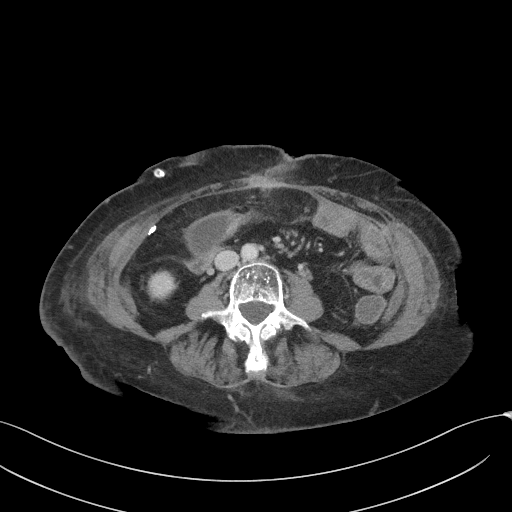
[im 57/89  soft-tissue]
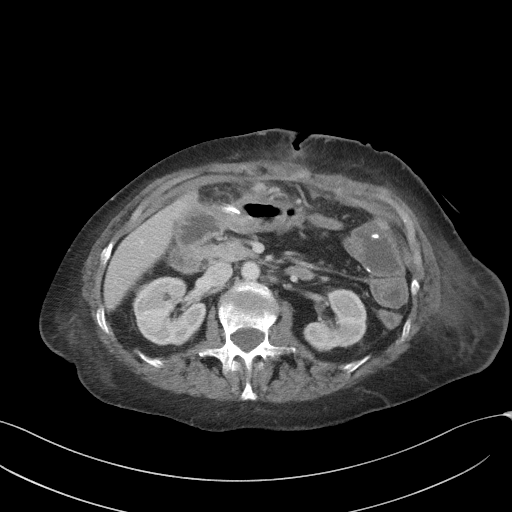
[im 63/89  soft-tissue]
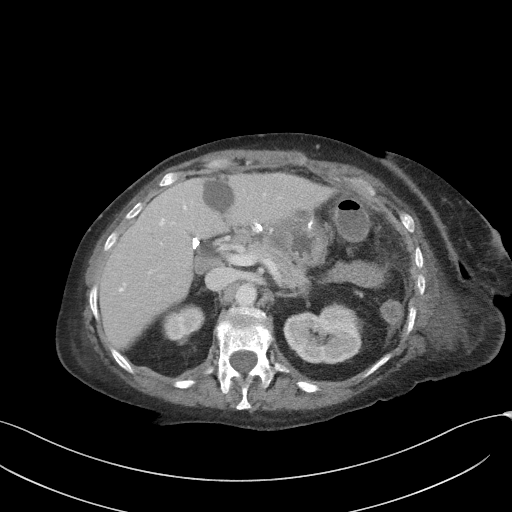
[im 63/89  bone]
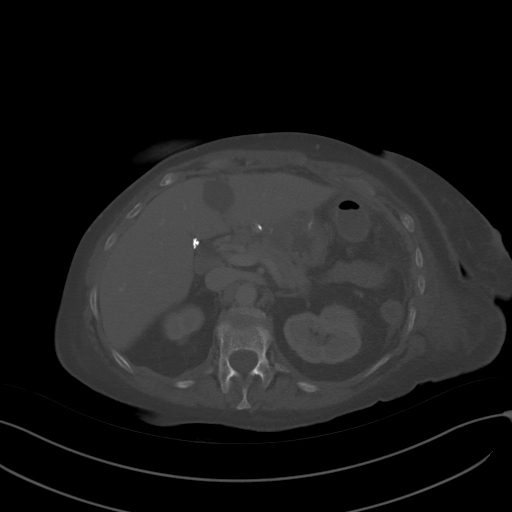
[im 68/89  soft-tissue]
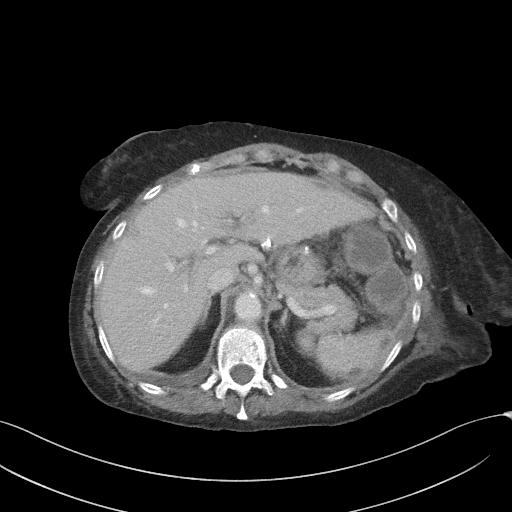
[im 78/89  soft-tissue]
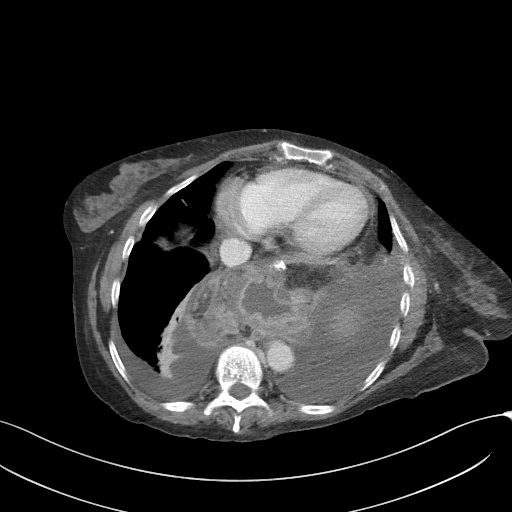
[im 83/89  soft-tissue]
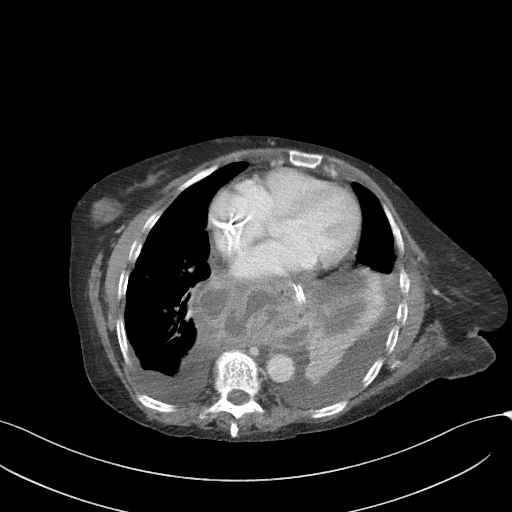

[Series 6: coronal st · coronal · 0.75mm/px · 3 of 87 slices shown]
[im 29/87  soft-tissue]
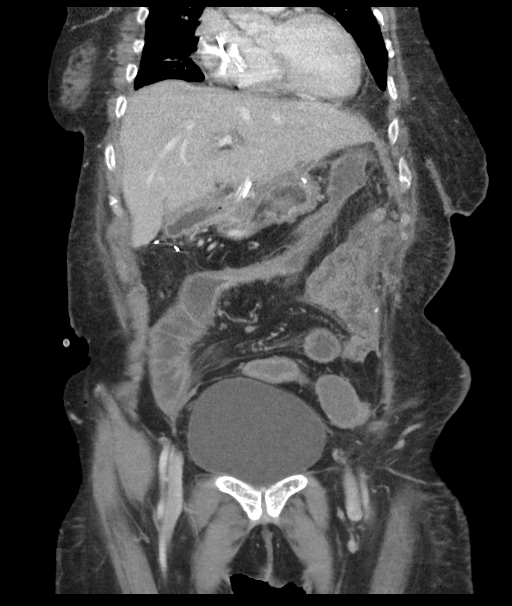
[im 39/87  soft-tissue]
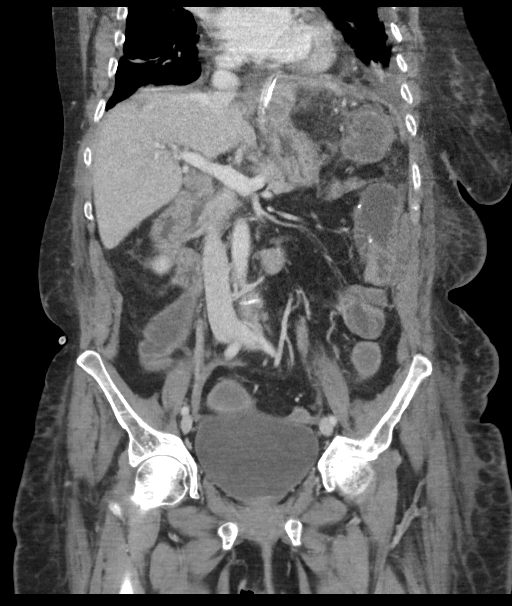
[im 48/87  soft-tissue]
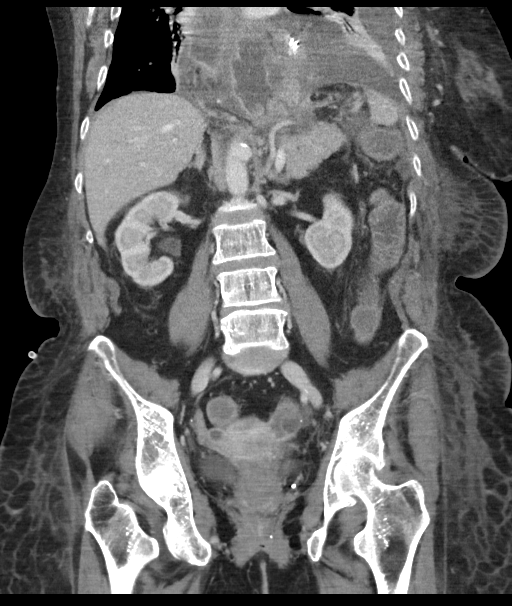

[15 of 46 positions shown; findings below may reference images not displayed]

FINDINGS: Lower chest: Moderate bilateral pleural effusions are noted with
adjacent atelectasis. There is again noted large hiatal hernia.
Crescent shaped fluid collection is again noted around the hernia.
One surgically placed drainage catheter has been removed from this
area. The remaining surgically placed drainage catheter is unchanged
in position, but the fluid is distal to the distal end of the
catheter.

Hepatobiliary: Status post cholecystectomy. 3.1 x 2.9 cm fluid
collection remains inferior to falciform ligament. No focal
abnormality is seen within the liver.

Pancreas: Unremarkable. No pancreatic ductal dilatation or
surrounding inflammatory changes.

Spleen: Normal in size without focal abnormality.

Adrenals/Urinary Tract: Adrenal glands are unremarkable. Kidneys are
normal, without renal calculi, focal lesion, or hydronephrosis.
Bladder is unremarkable.

Stomach/Bowel: There is no evidence of bowel obstruction. Mild wall
thickening of cecum and transverse colon is noted which may
represent mild inflammation. Status post appendectomy.

Vascular/Lymphatic: No significant vascular findings are present. No
enlarged abdominal or pelvic lymph nodes.

Reproductive: Uterus and bilateral adnexa are unremarkable.

Other: There is been interval placement of right trans gluteal
percutaneous drainage catheter. Fluid collection noted in pre rectal
space on prior exam is significantly smaller. Only a small amount of
residual fluid remains, measuring 6.3 x 1.8 cm.

Musculoskeletal: No acute or significant osseous findings.
IMPRESSION: Moderate bilateral pleural effusions are noted with adjacent
atelectasis.

Large hiatal hernia is again noted. Crescent-shaped fluid collection
is again noted around the hernia which is not significantly changed.
One of the surgically placed drains seen within this has been
removed in the interval. The remaining drain does not appear to be
positioned within the fluid collection.

3 cm fluid collection is seen inferior to falciform ligament which
is slightly smaller compared to prior exam.

Interval placement of right trans gluteal percutaneous drainage
catheter into pre rectal abscess or fluid collection. This fluid
collection is significantly smaller compared to prior exam, with
only a small amount of residual fluid remaining.

## 2018-10-15 IMAGING — CT CT ABD-PELV W/ CM
2 of 5 series · 16 of 46 positions shown, 18 images · IV contrast (ISOVUE)
Comparison: 09/24/2017 as well as multiple prior CT studies.

CLINICAL DATA: Fever. Status post multiple recent surgical
procedures including repair of a large paraesophageal hiatal hernia
complicated by peritonitis, takedown of gastrostomy, repair of
gastrotomy, hepatorrhaphy and percutaneous catheter drainage of
pelvic abscess.

EXAM:
CT ABDOMEN AND PELVIS WITH CONTRAST
TECHNIQUE: Multidetector CT imaging of the abdomen and pelvis was performed
using the standard protocol following bolus administration of
intravenous contrast.
CONTRAST:  100mL FXVJQW-BPP IOPAMIDOL (FXVJQW-BPP) INJECTION 61%

[Series 2: abd/pel with · axial · 0.74mm/px · z∈[-295,+45]mm · 13 of 78 slices shown, 15 images]
[im 5/78  soft-tissue]
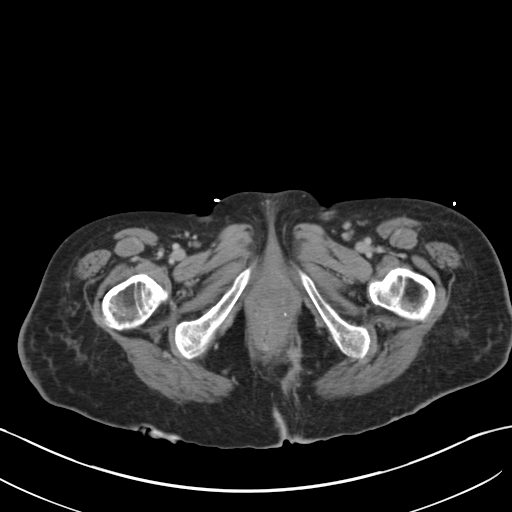
[im 5/78  bone]
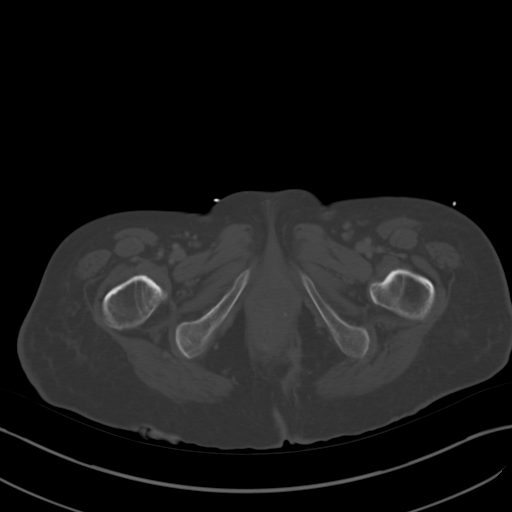
[im 10/78  soft-tissue]
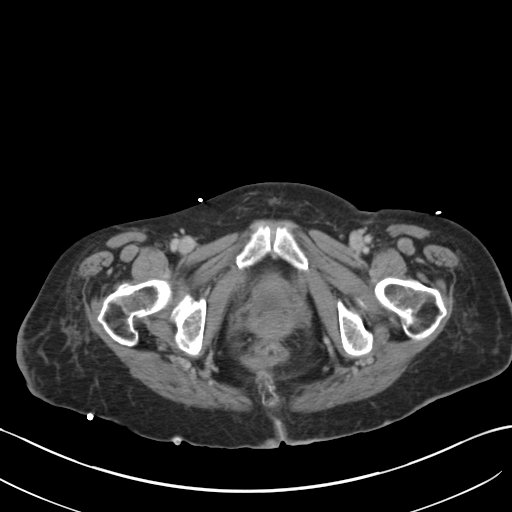
[im 19/78  soft-tissue]
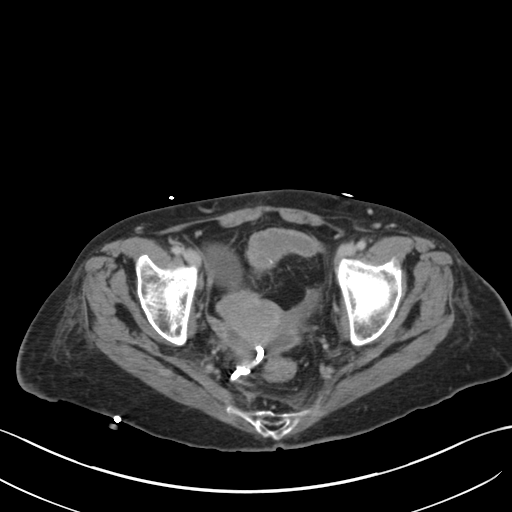
[im 23/78  soft-tissue]
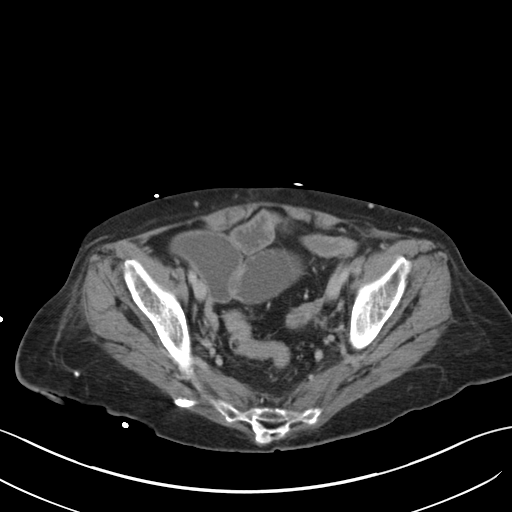
[im 28/78  soft-tissue]
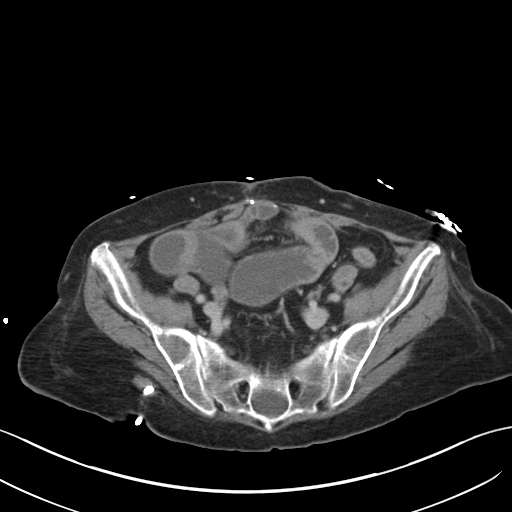
[im 32/78  soft-tissue]
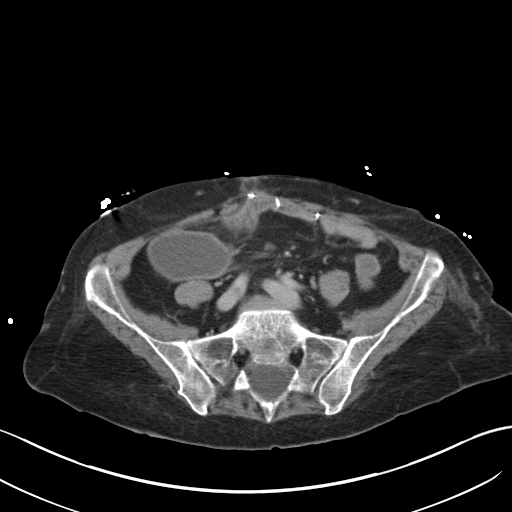
[im 41/78  soft-tissue]
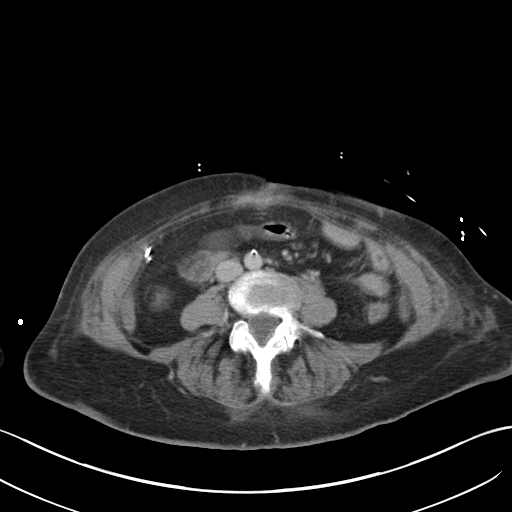
[im 46/78  soft-tissue]
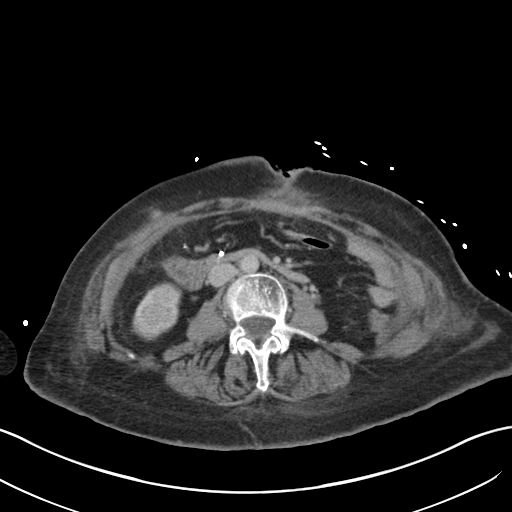
[im 50/78  soft-tissue]
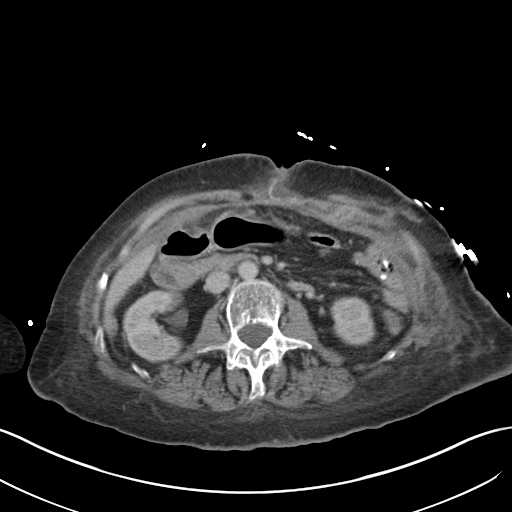
[im 50/78  bone]
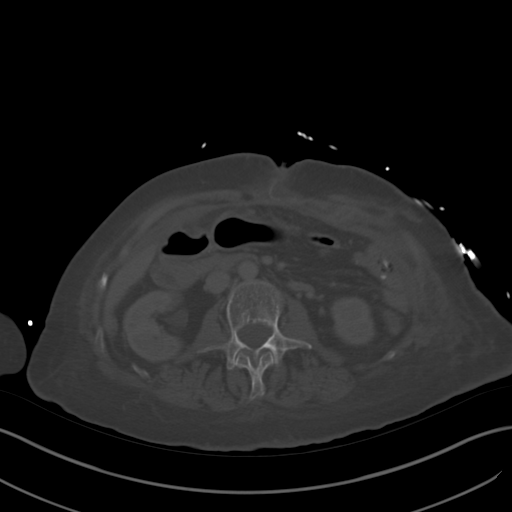
[im 55/78  soft-tissue]
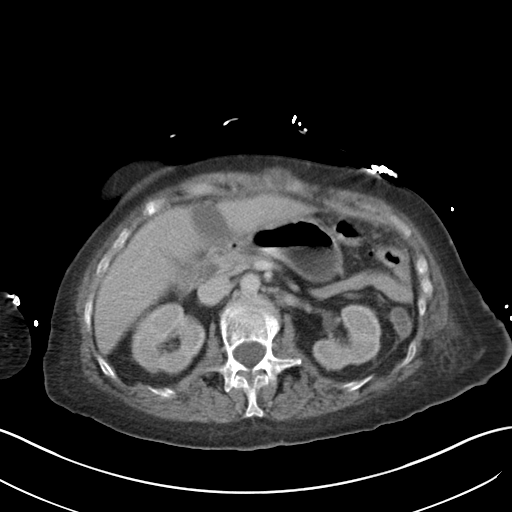
[im 59/78  soft-tissue]
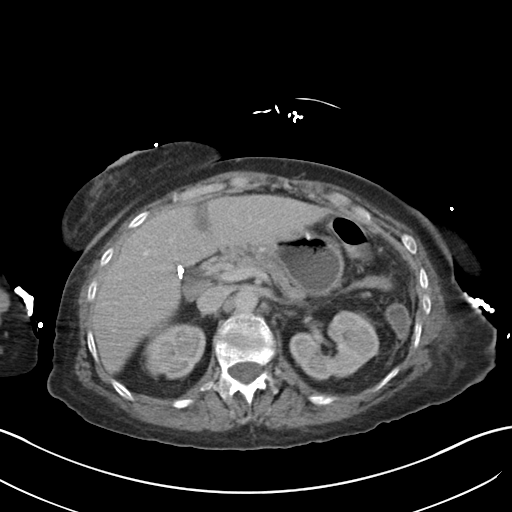
[im 68/78  soft-tissue]
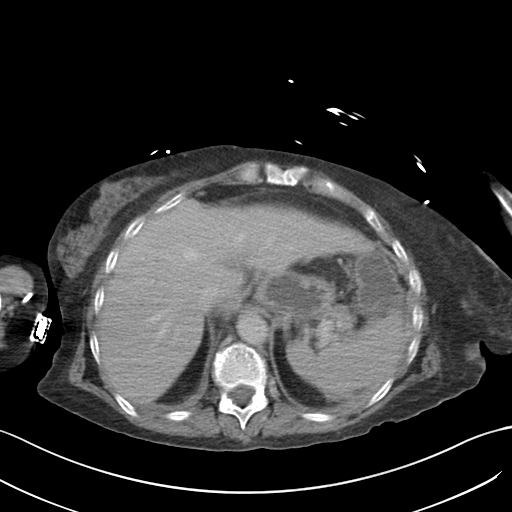
[im 73/78  soft-tissue]
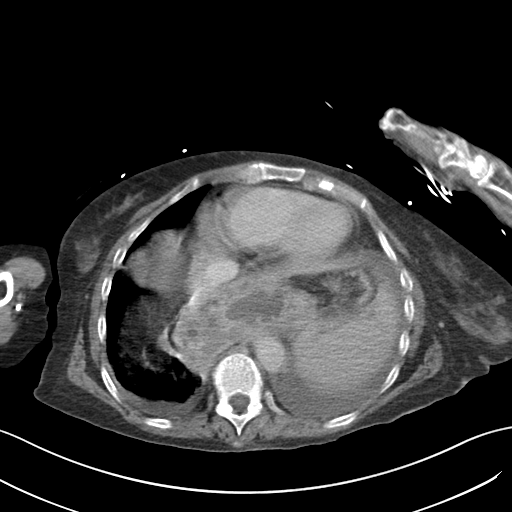

[Series 4: coronal a/|p · coronal · 0.81mm/px · 3 of 114 slices shown]
[im 38/114  soft-tissue]
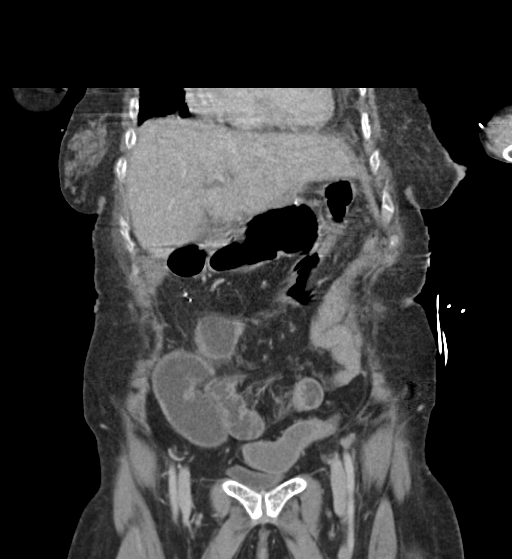
[im 51/114  soft-tissue]
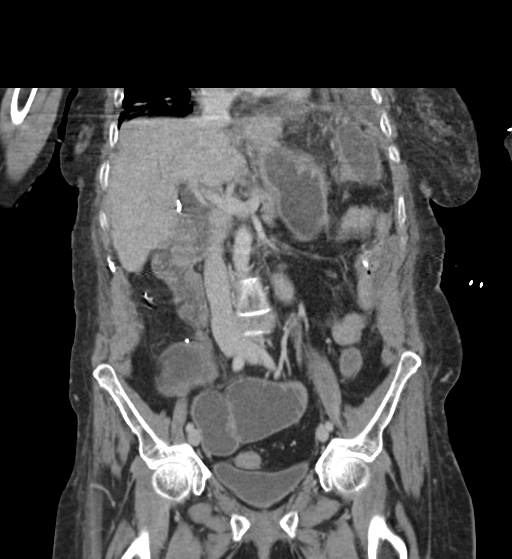
[im 63/114  soft-tissue]
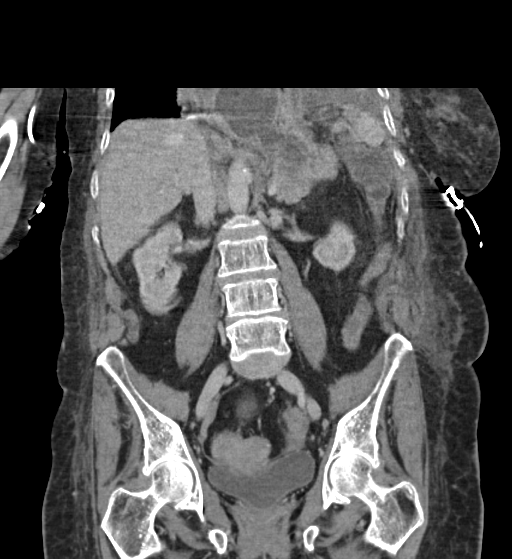

[16 of 46 positions shown; findings below may reference images not displayed]

FINDINGS: Lower chest: In the lower chest, there remains a moderate hiatal
hernia with some residual small amount of complex fluid around the
hernia. This appears improved compared to the prior study but is
incompletely visualized. A mediastinal drain has been removed. There
remains a moderate amount of pleural fluid at the left lung base.
Given fever, there may be benefit to examining the entire chest for
evidence of infection in the chest.

Hepatobiliary: No focal liver abnormality is seen. Status post
cholecystectomy. No biliary dilatation.

Pancreas: Unremarkable. No pancreatic ductal dilatation or
surrounding inflammatory changes.

Spleen: Normal in size without focal abnormality.

Adrenals/Urinary Tract: Adrenal glands are unremarkable. Kidneys are
normal, without renal calculi, focal lesion, or hydronephrosis.
Bladder is unremarkable.

Stomach/Bowel: A few pelvic small bowel loops are moderately dilated
and fluid-filled, likely consistent with ileus. Other small bowel
loops and the colon are decompressed and there is no evidence of a
significant bowel obstruction. No evidence of free intraperitoneal
air or new focal abscess.

Vascular/Lymphatic: No significant vascular findings are present. No
enlarged abdominal or pelvic lymph nodes.

Reproductive: Uterus and bilateral adnexa are unremarkable.

Other: Right transgluteal percutaneous drainage catheter remains in
place. The cul de sac abscess has essentially resolved with small
residual amount of fluid remaining adjacent to the drainage
catheter.

Musculoskeletal: No acute or significant osseous findings.
IMPRESSION: 1. Less prominent complex fluid surrounding residual hiatal hernia
in the lower chest. This is incompletely visualized, however and
there remains a moderate left pleural effusion. Given no significant
new findings in the abdomen or pelvis, there may be benefit in
obtaining a CT of the chest with contrast to examine the entire
chest.
2. Some degree of ileus involving distal small bowel without
evidence of significant bowel obstruction. No evidence of bowel
perforation or new focal abscess.
3. Nearly completely decompressed and resolved pelvic abscess at the
level of the cul de sac. Only a small amount of fluid remains
adjacent to the indwelling pelvic drainage catheter.

## 2018-10-15 IMAGING — DX DG CHEST 1V PORT
2 series · 2 of 2 positions shown · non-contrast
Comparison: 09/14/2017 and 09/11/2017 radiographs.

CLINICAL DATA: Fever today. Recent abdominal surgery. Nausea and
vomiting.

EXAM:
PORTABLE CHEST 1 VIEW

[chest ap (1 of 2)]
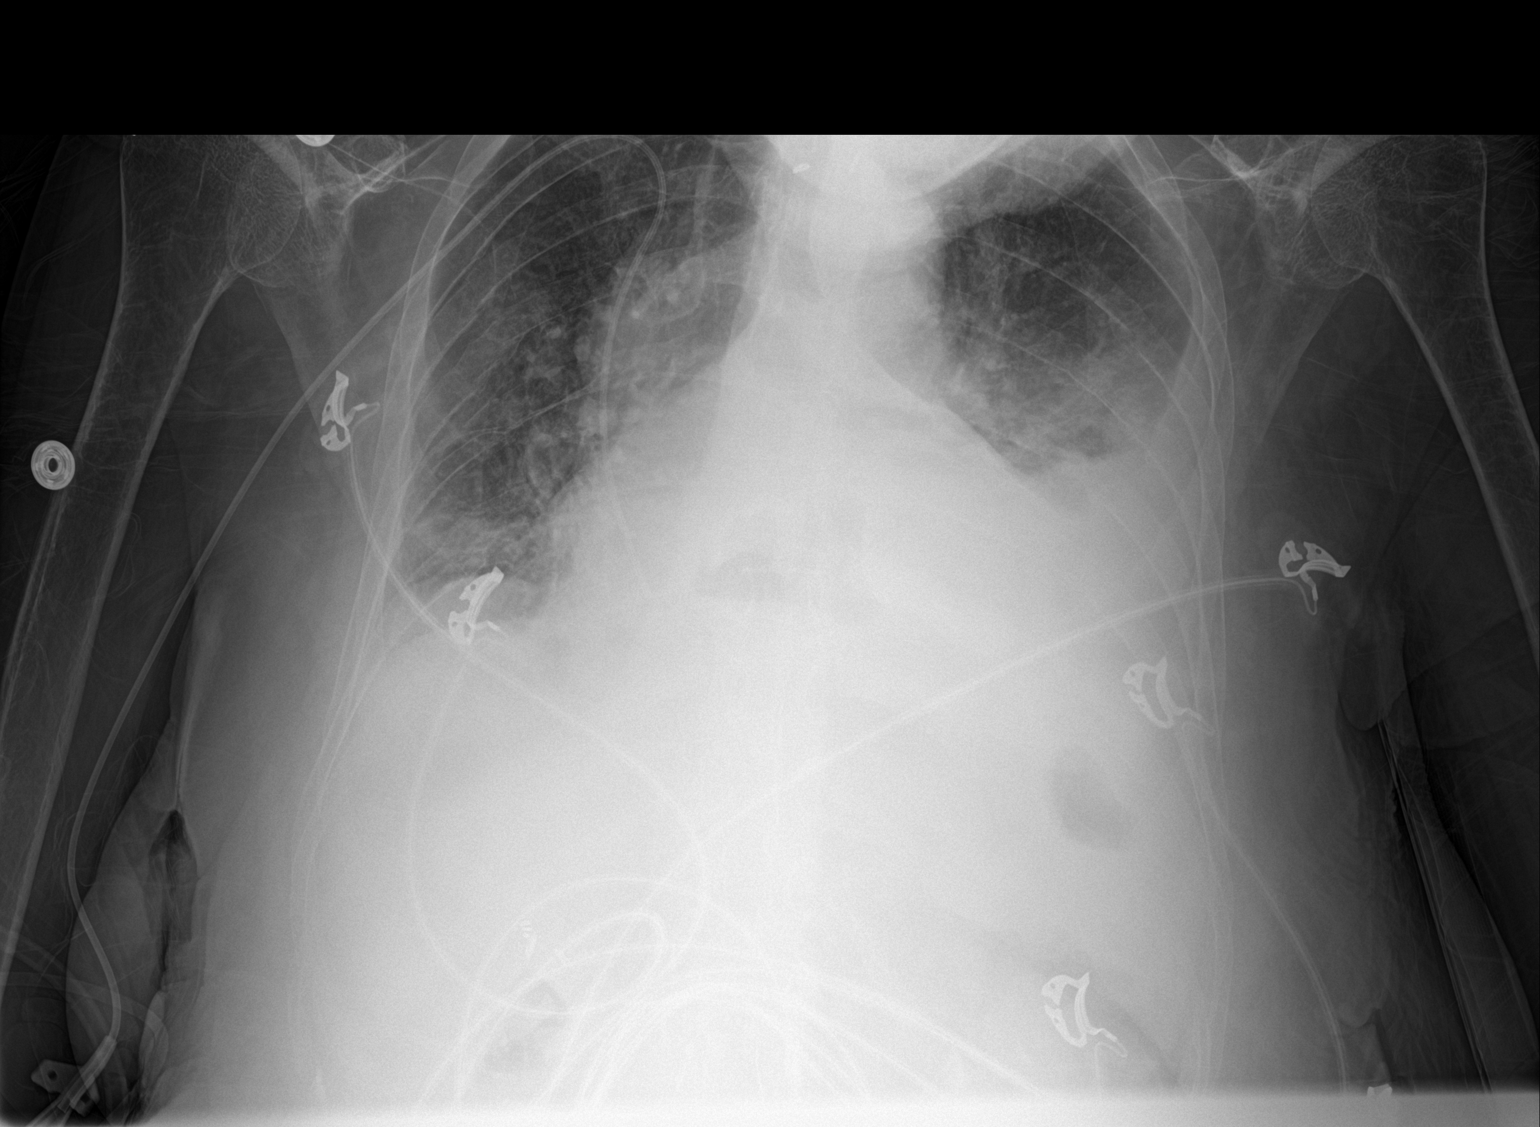

[chest ap (2 of 2)]
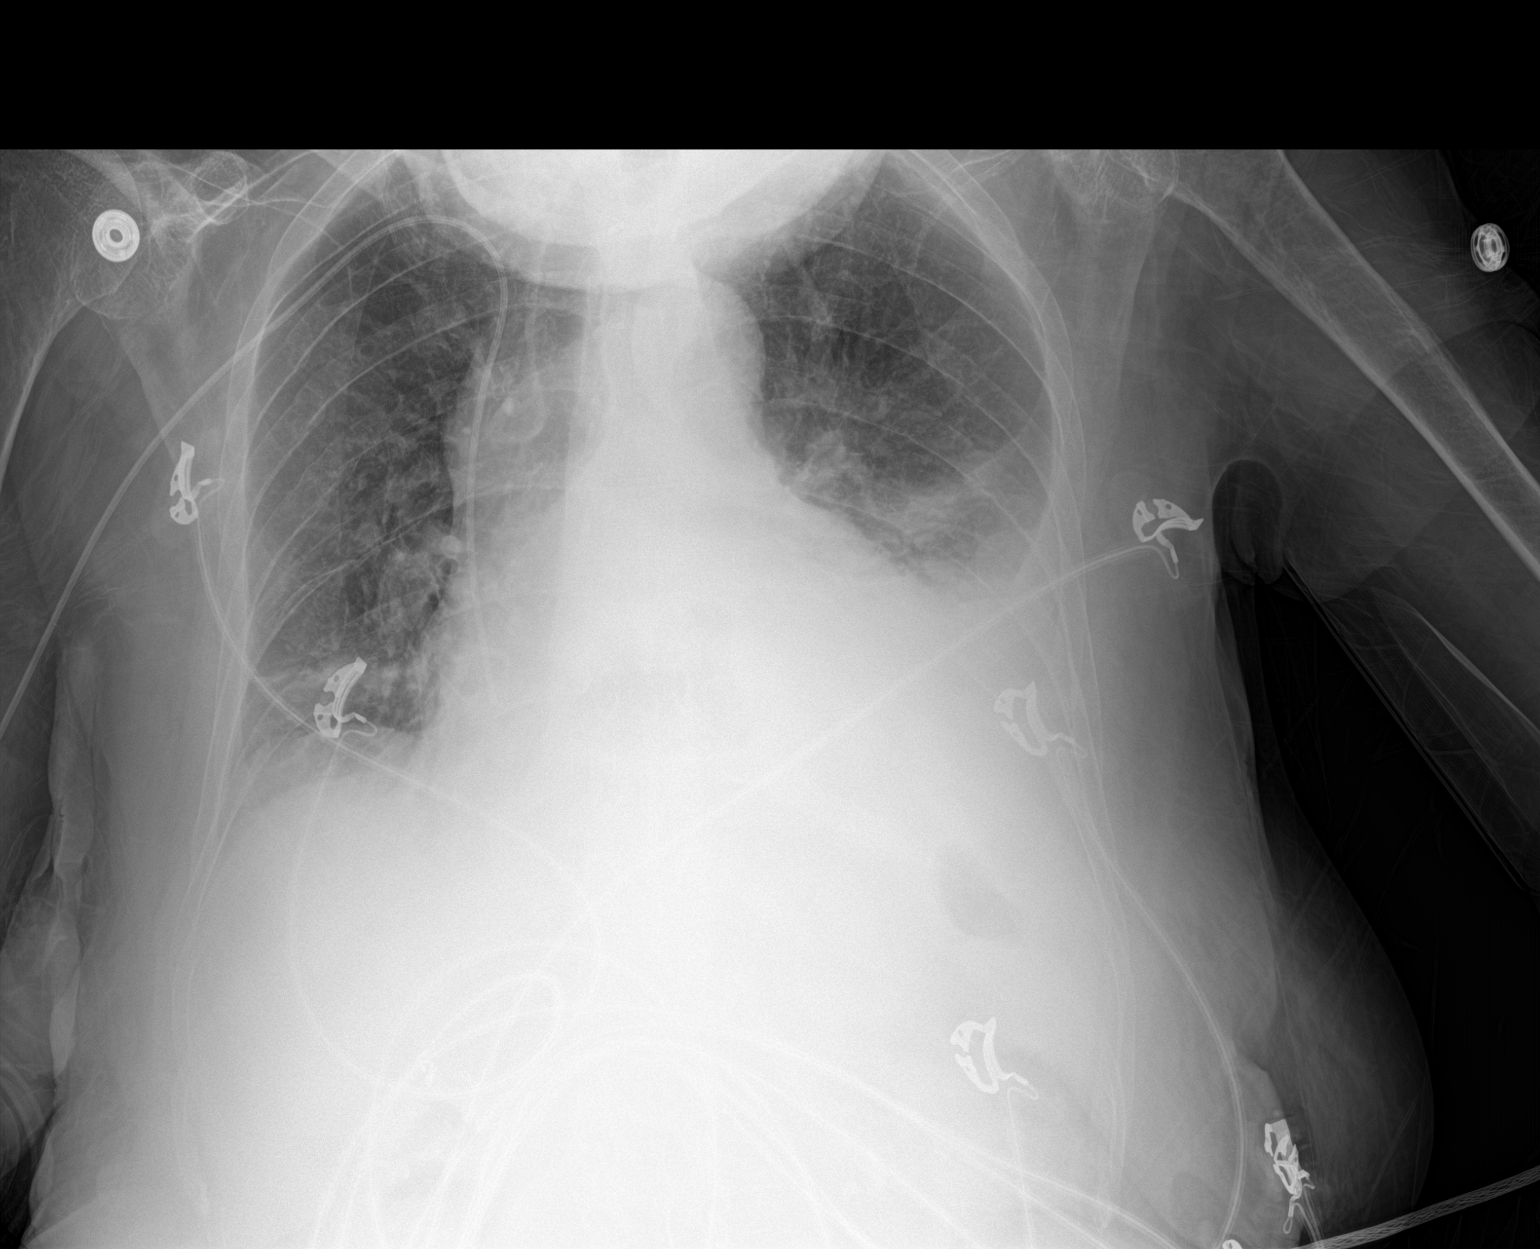

[2 of 2 positions shown; findings below may reference images not displayed]

FINDINGS: 4112 hours. Two views obtained. The nasogastric tube has been
removed. The right arm PICC appears unchanged with the tip at the
mid right atrial level. There is stable cardiomegaly. Patient has a
known hiatal hernia. Left pleural effusion and left basilar opacity
have increased. Otherwise, there is improved aeration of the lungs
without definite residual edema. There is no pneumothorax. The bones
appear unchanged.
IMPRESSION: Interval resolution of pulmonary edema. Worsening left pleural
effusion and left basilar opacity, likely atelectasis.

## 2018-10-18 IMAGING — DX DG CHEST 1V PORT
2 series · 2 of 2 positions shown · non-contrast
Comparison: 10/01/2017, 09/14/2017 and earlier.

CLINICAL DATA: Bedside PICC placement.

EXAM:
PORTABLE CHEST 1 VIEW

[chest ap (1 of 2)]
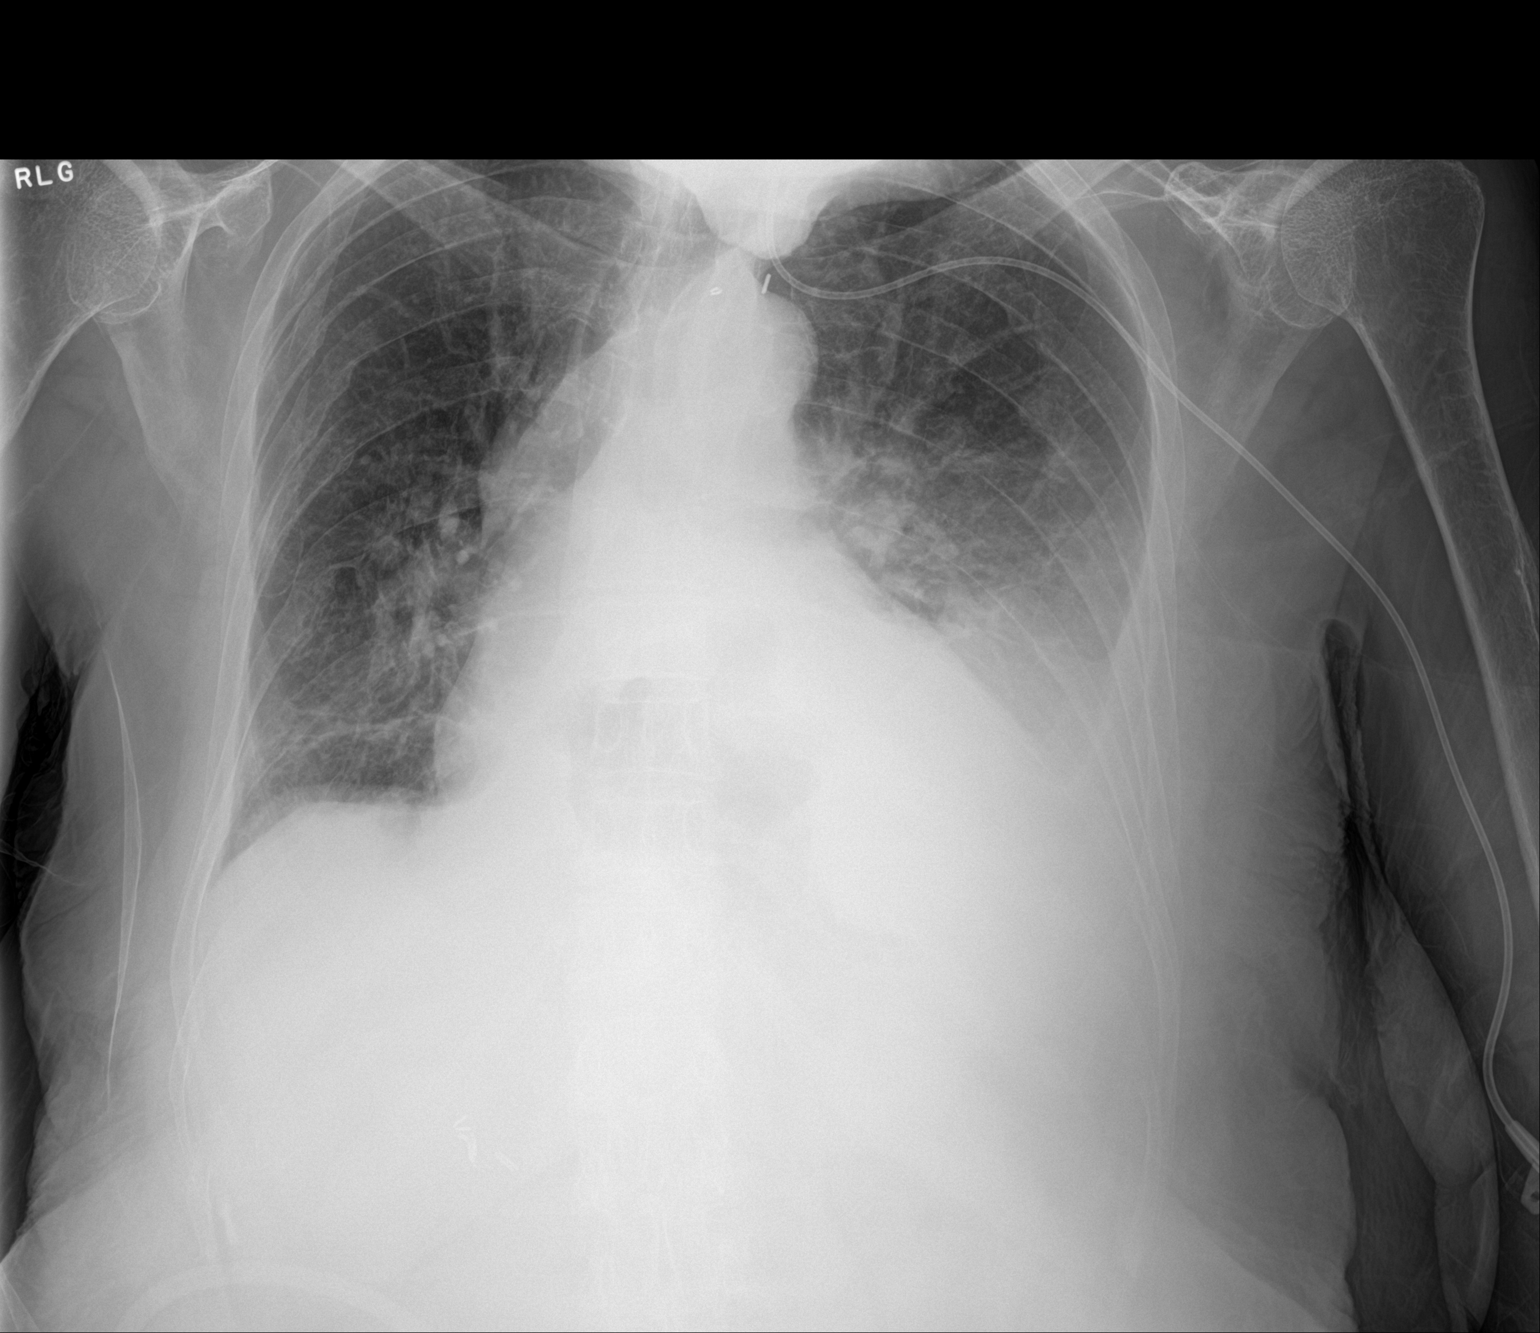

[chest ap (2 of 2)]
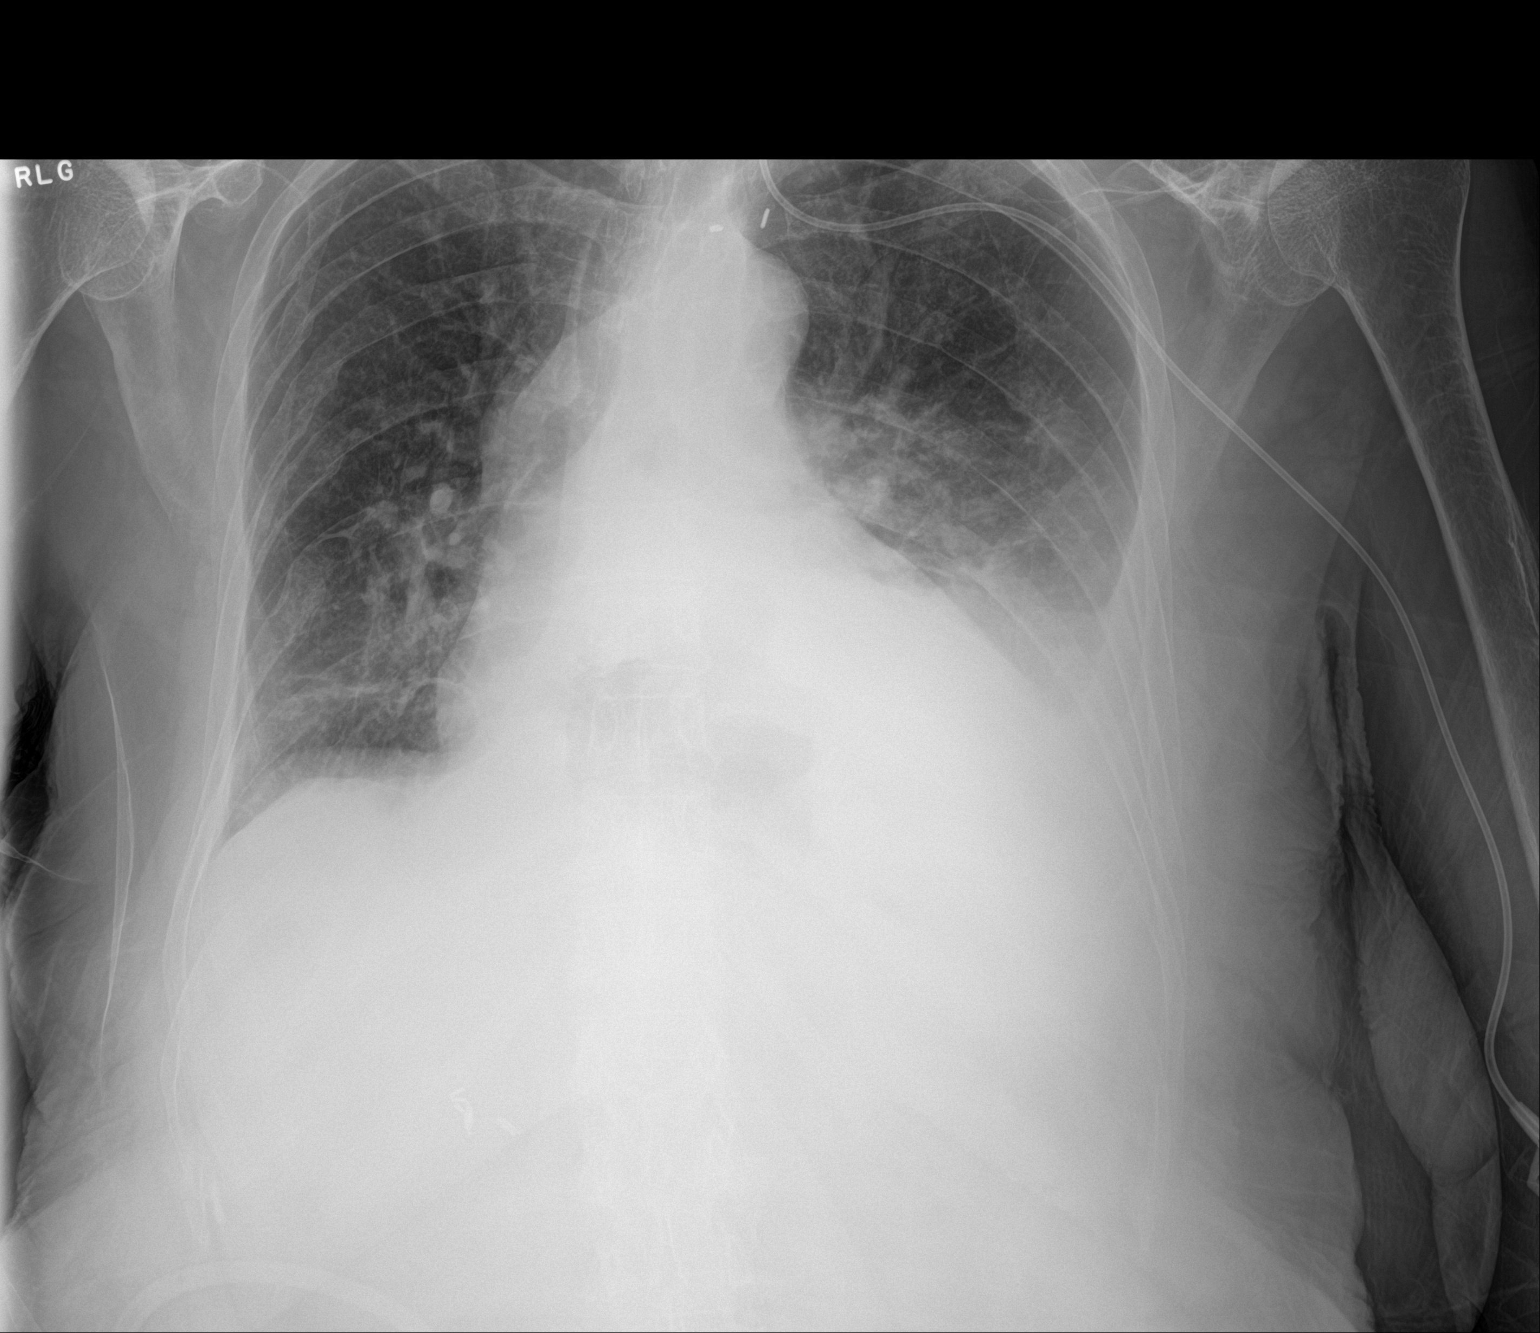

[2 of 2 positions shown; findings below may reference images not displayed]

FINDINGS: New left arm PICC courses up into the left internal jugular vein in
the tip is not included. The right arm PICC has been removed in the
interval. Stable moderate cardiac enlargement. Hiatal hernia as
noted previously.

Interval decrease in size of the left pleural effusion which is
still moderate to large. Stable dense consolidation in the left
lower lobe. Improved aeration in the right lung base with mild
atelectasis persisting. Mild pulmonary venous hypertension without
evidence of pulmonary edema currently. No new pulmonary parenchymal
abnormalities.
IMPRESSION: 1. Left arm PICC tip courses up into the left internal jugular vein.
The catheter should be withdrawn and redirected.
2. Decrease in size of the still moderate to large left pleural
effusion since the examination 3 days ago.
3. Stable dense left lower lobe pneumonia and/or atelectasis.
4. Improved aeration in the right lung base with mild atelectasis
persisting.
5. No new abnormalities in the lungs.
These results will be called to the ordering clinician or
representative by the Radiologist Assistant, and communication
documented in the PACS or zVision Dashboard.

## 2018-10-19 IMAGING — CT CT CHEST W/O CM
2 of 3 series · 15 of 36 positions shown, 18 images · non-contrast
Comparison: 09/09/2017 CT chest.  04/29/2017 chest radiograph.

CLINICAL DATA: 70 y/o F; shortness of breath with history of hiatal
hernia, Crohn's disease, psoriasis, an small bowel obstruction.

EXAM:
CT CHEST WITHOUT CONTRAST
TECHNIQUE: Multidetector CT imaging of the chest was performed following the
standard protocol without IV contrast.

[Series 2: thorax · axial · 0.63mm/px · z∈[+1498,+1722]mm · 12 of 132 slices shown, 15 images]
[im 10/132  mediastinal]
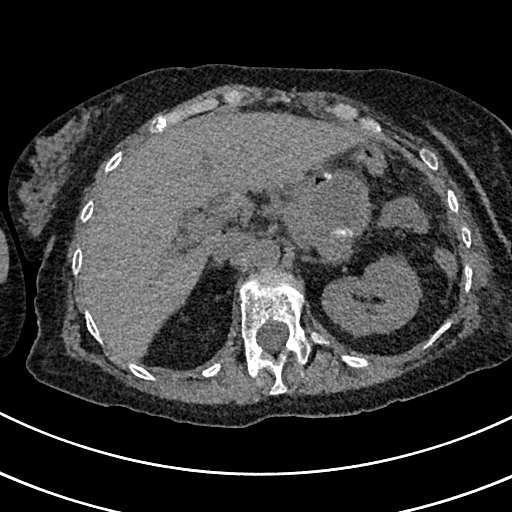
[im 10/132  lung]
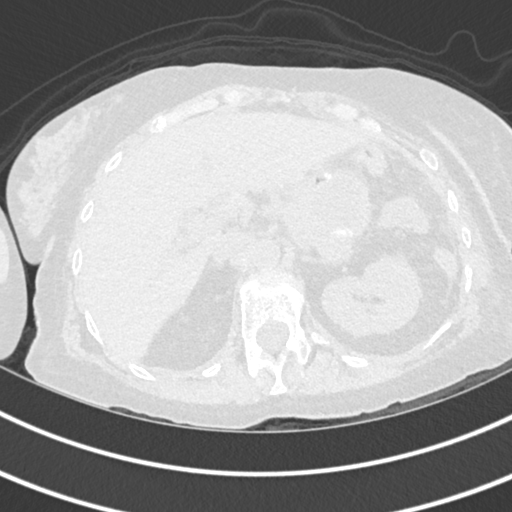
[im 20/132  lung]
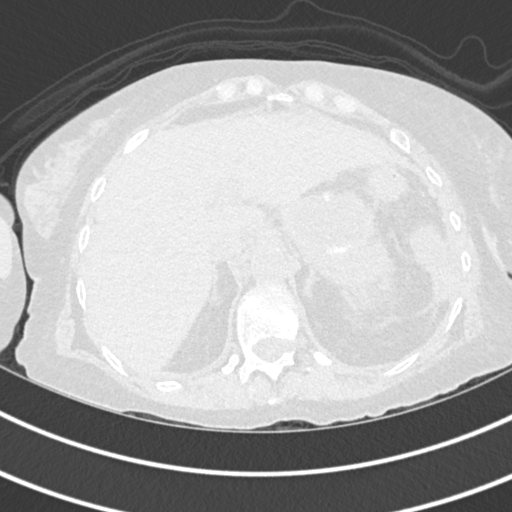
[im 30/132  lung]
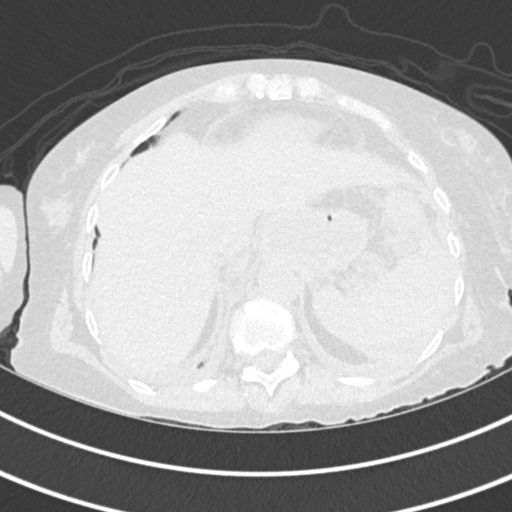
[im 39/132  lung]
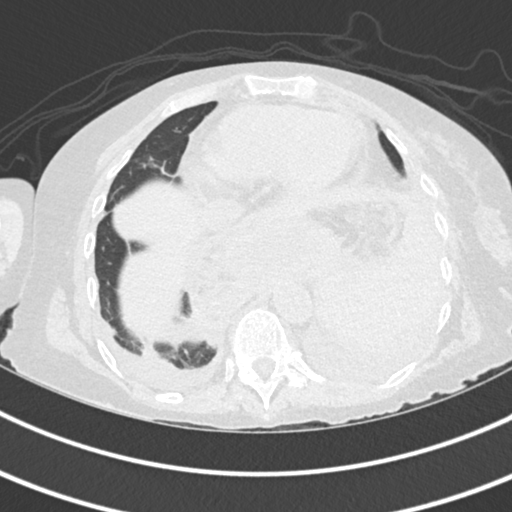
[im 49/132  mediastinal]
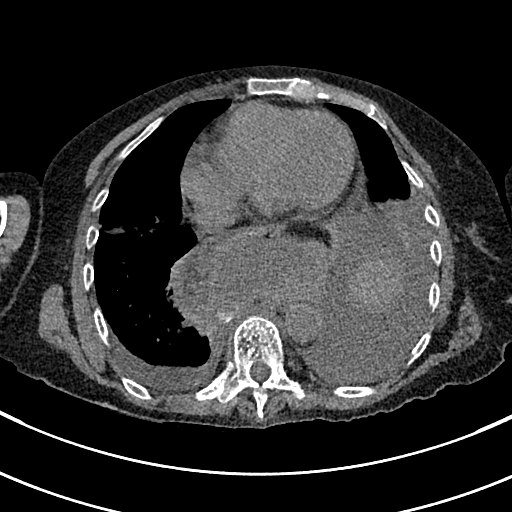
[im 49/132  lung]
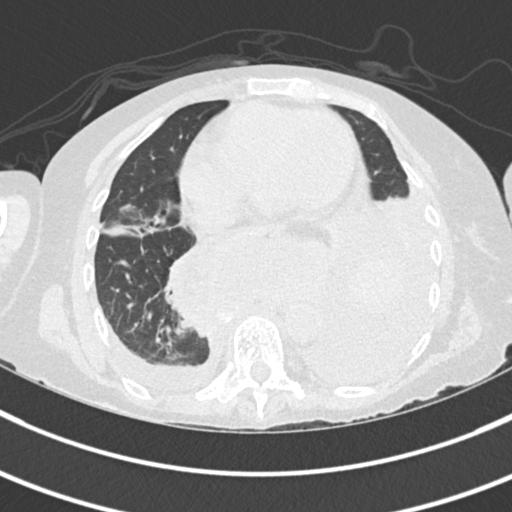
[im 59/132  lung]
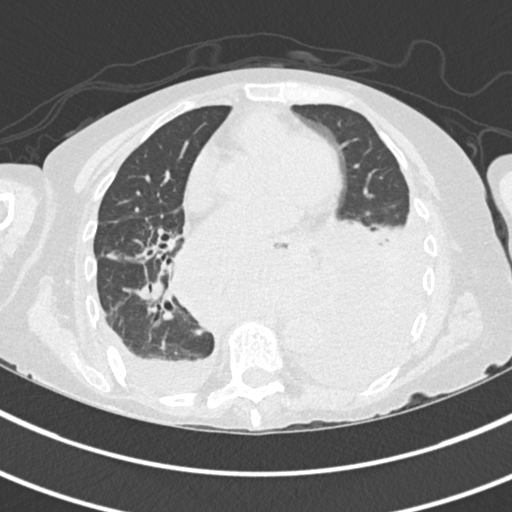
[im 73/132  lung]
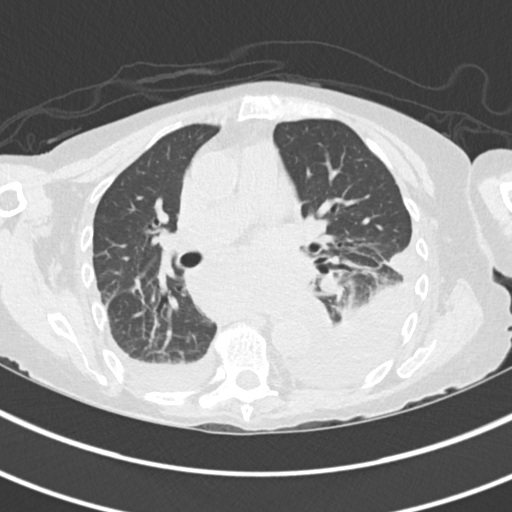
[im 83/132  lung]
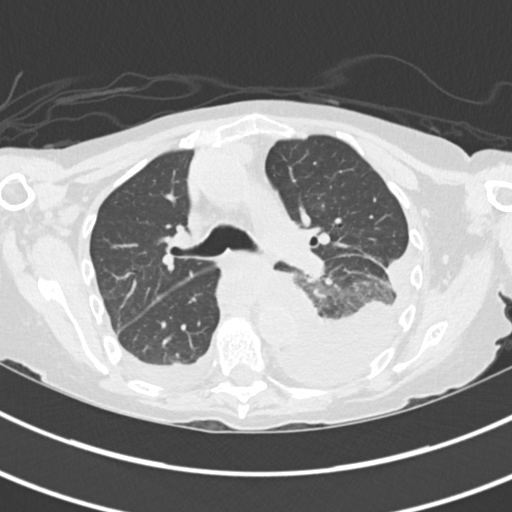
[im 93/132  mediastinal]
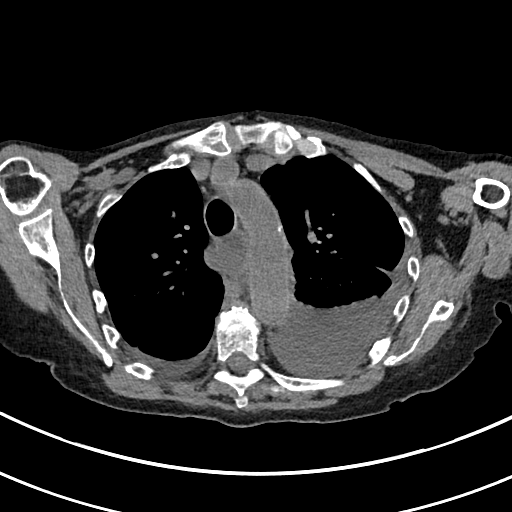
[im 93/132  lung]
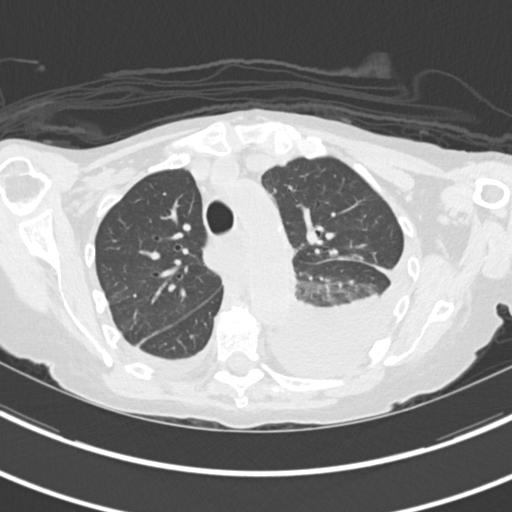
[im 102/132  lung]
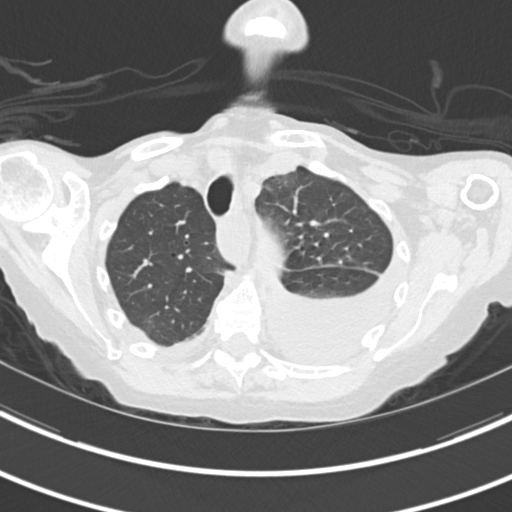
[im 112/132  lung]
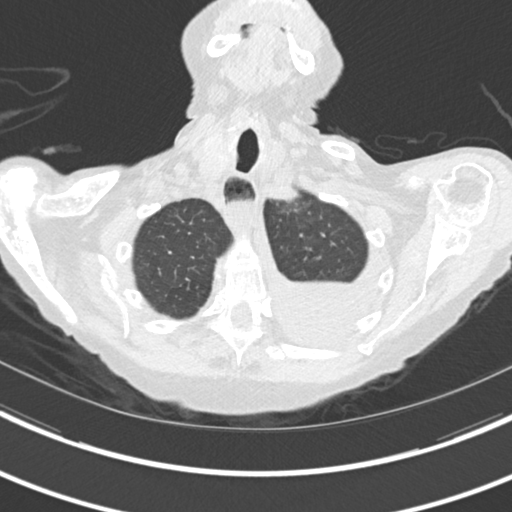
[im 122/132  lung]
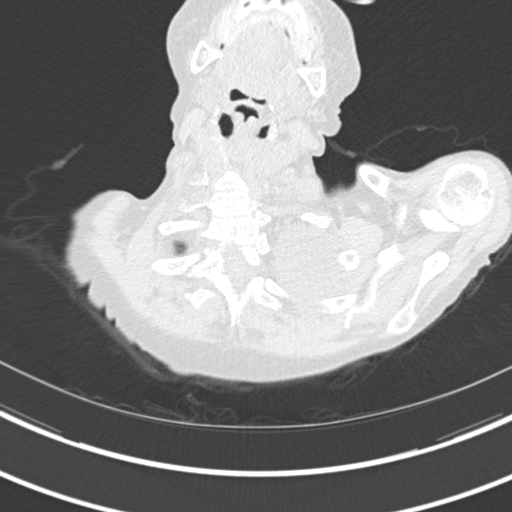

[Series 6: coronal · coronal · 0.53mm/px · 3 of 114 slices shown]
[im 23/114  lung]
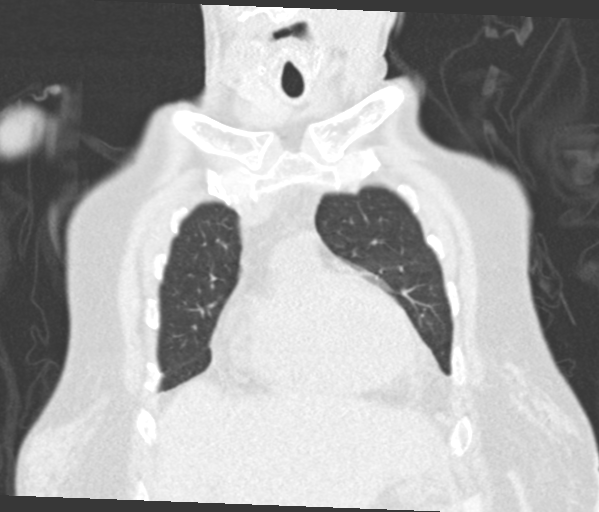
[im 46/114  lung]
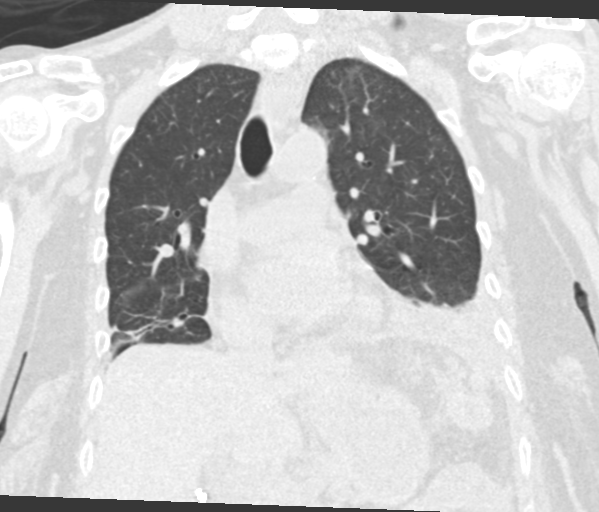
[im 68/114  lung]
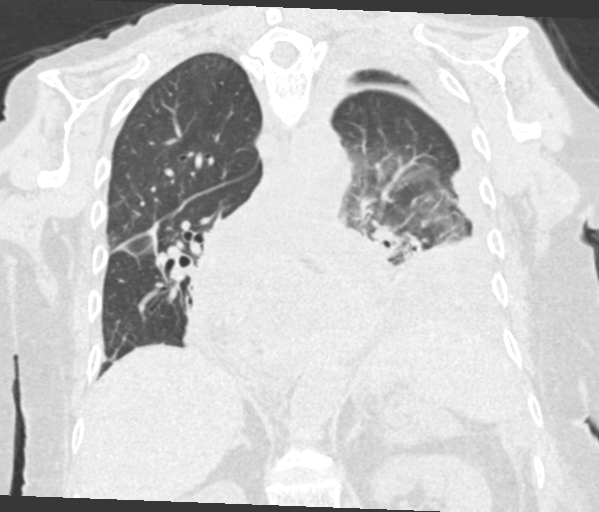

[15 of 36 positions shown; findings below may reference images not displayed]

FINDINGS: Cardiovascular: No significant vascular findings. Normal heart size.
No pericardial effusion. Mild coronary artery calcification.

Mediastinum/Nodes: Large hiatal hernia. Patulous fluid-filled
esophagus. Right lobe of thyroid nodule measuring up to 18 mm
(series 2, image 20).

Lungs/Pleura: Small right and moderate left pleural effusions. Right
lower lobe platelike atelectasis. Partial consolidation of the left
lower lobe. Mild interlobular septal thickening compatible with
interstitial edema.

Upper Abdomen: Cholecystectomy.

Musculoskeletal: Multiple chronic bilateral rib fractures. Stable
mild anterior L1 compression deformity. No acute osseous abnormality
identified.
IMPRESSION: 1. Small right and moderate left pleural effusions. Mild
interstitial pulmonary edema.
2. Partial consolidation of left lower lobe may represent
atelectasis or pneumonia.
3. Large hiatal hernia. Patulous fluid-filled esophagus indicating
reflux.
4. Right lobe of thyroid nodule measuring 18 mm, thyroid ultrasound
recommended on a nonemergent basis.
5. Mild coronary artery calcification.

By: Noriiel Nuboni M.D.

## 2018-10-28 IMAGING — CT CT ABD-PELV W/ CM
2 of 5 series · 15 of 46 positions shown, 17 images · IV contrast (ISOVUE)
Comparison: CT abdomen dated 10/01/2017.

CLINICAL DATA: Nausea, vomiting.  Decreased urine output.

EXAM:
CT ABDOMEN AND PELVIS WITH CONTRAST
TECHNIQUE: Multidetector CT imaging of the abdomen and pelvis was performed
using the standard protocol following bolus administration of
intravenous contrast.
CONTRAST:  100mL [id number] IOPAMIDOL ([id number]) INJECTION 61%

[Series 2: abd/pel with · axial · 0.74mm/px · z∈[-332,+43]mm · 12 of 87 slices shown, 14 images]
[im 6/87  soft-tissue]
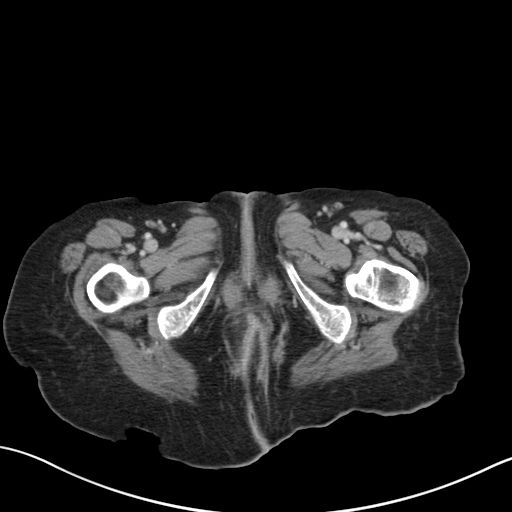
[im 6/87  bone]
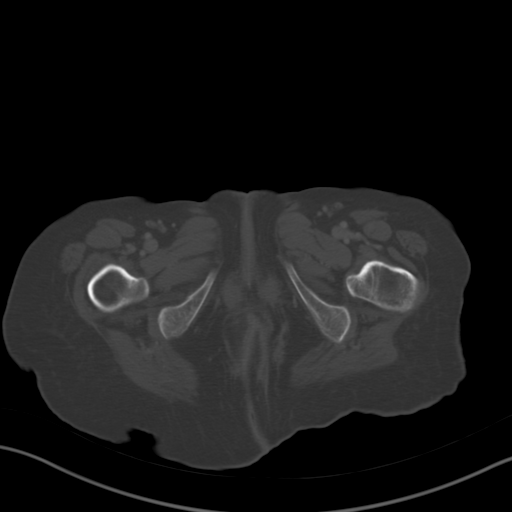
[im 11/87  soft-tissue]
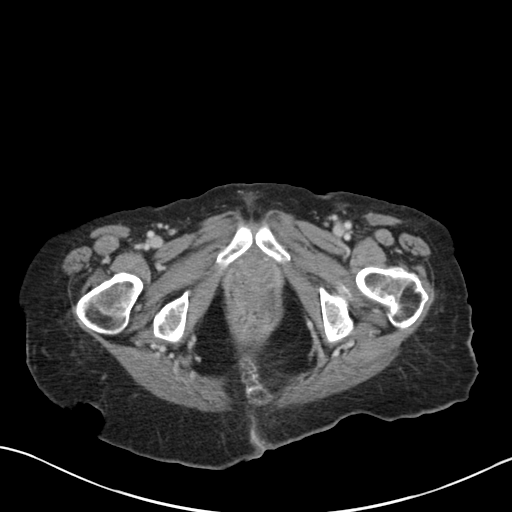
[im 22/87  soft-tissue]
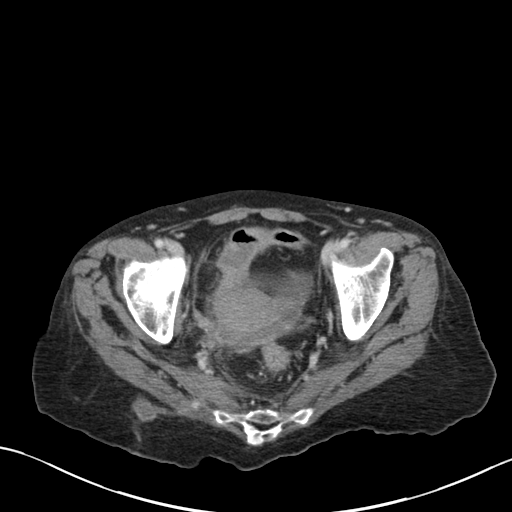
[im 27/87  soft-tissue]
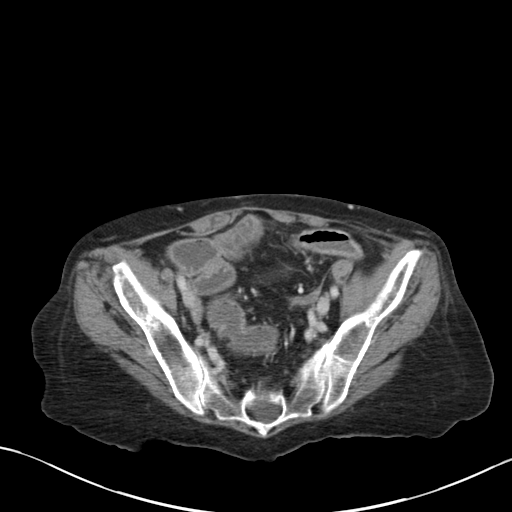
[im 33/87  soft-tissue]
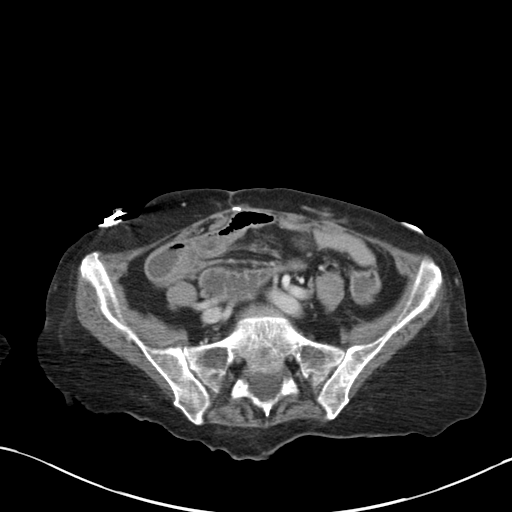
[im 38/87  soft-tissue]
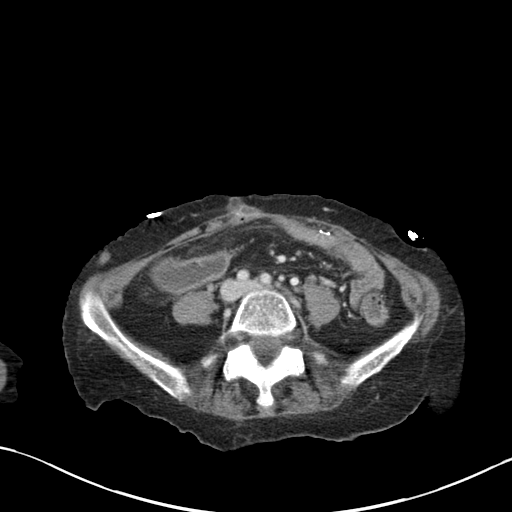
[im 49/87  soft-tissue]
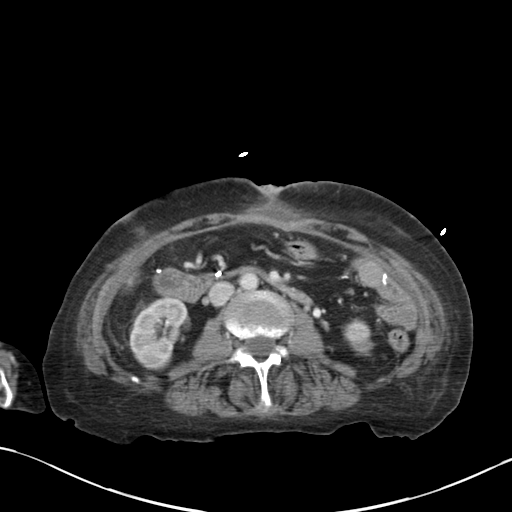
[im 54/87  soft-tissue]
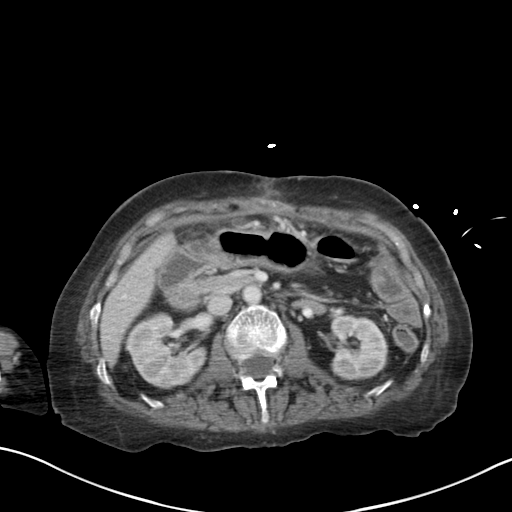
[im 60/87  soft-tissue]
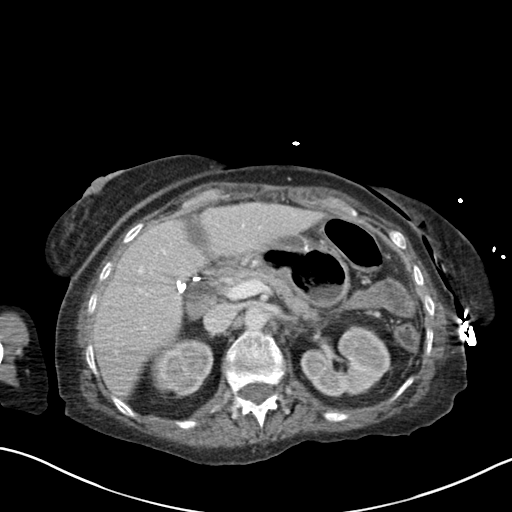
[im 60/87  bone]
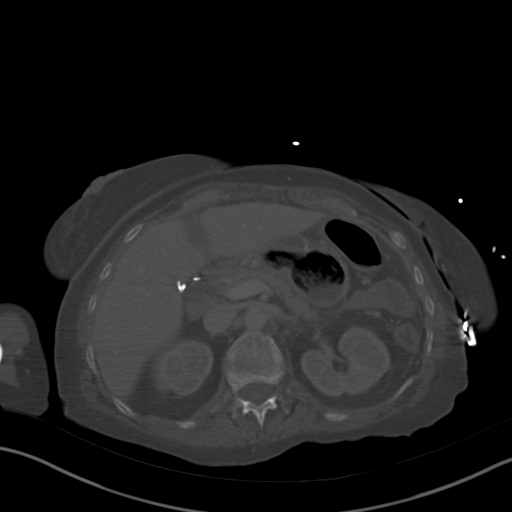
[im 65/87  soft-tissue]
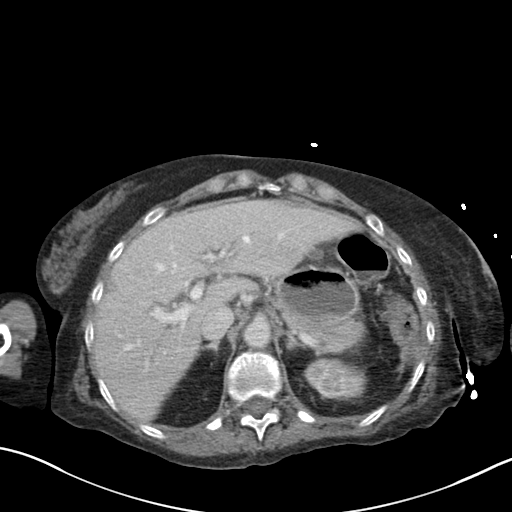
[im 76/87  soft-tissue]
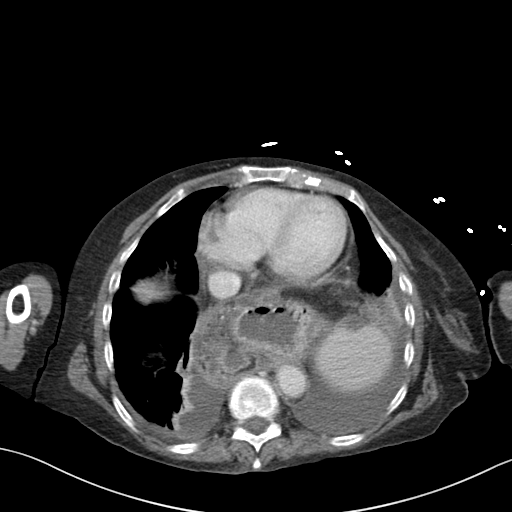
[im 81/87  soft-tissue]
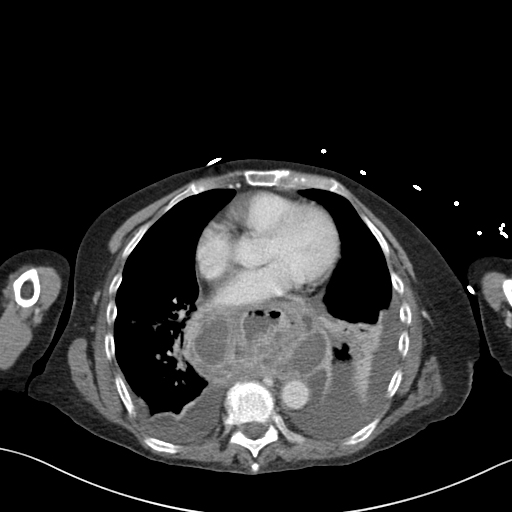

[Series 4: coronal a/|p · coronal · 0.67mm/px · 3 of 110 slices shown]
[im 37/110  soft-tissue]
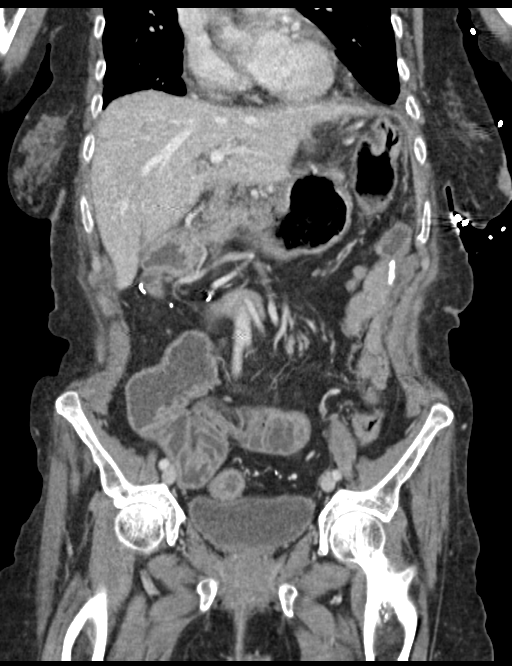
[im 49/110  soft-tissue]
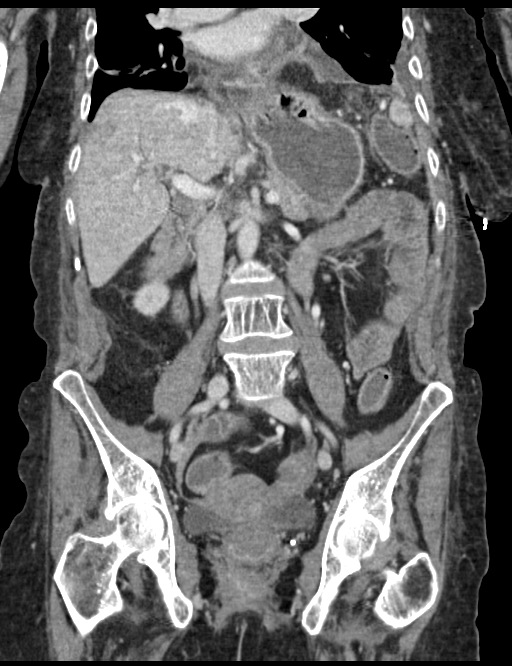
[im 61/110  soft-tissue]
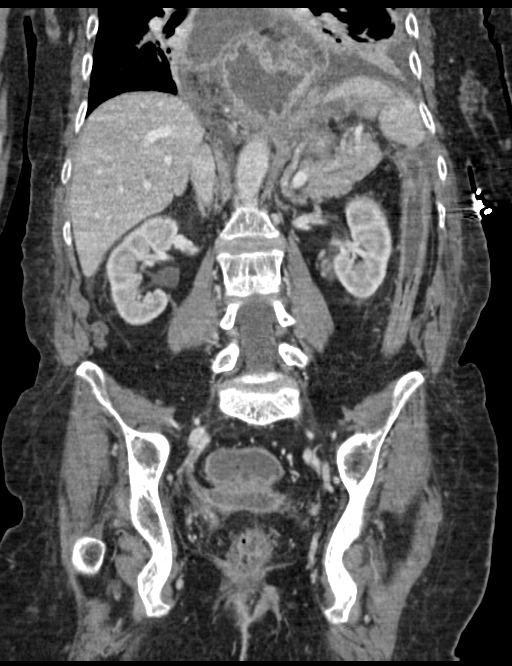

[15 of 46 positions shown; findings below may reference images not displayed]

FINDINGS: Lower chest: Bibasilar pleural effusions, left greater than right,
incompletely imaged, with associated atelectasis. Interval
reaccumulation of the fluid collection in the lower mediastinum,
surrounding the herniated stomach, measuring approximately 8 cm
transverse dimension by 3 cm greatest thickness, craniocaudal
dimension incompletely imaged. This collection is now similar to the
size seen on CT abdomen of 09/16/2017, when a drainage catheter was
in place.

Hepatobiliary: No focal liver abnormality is seen. Status post
cholecystectomy. No biliary dilatation.

Pancreas: Unremarkable. No pancreatic ductal dilatation or
surrounding inflammatory changes.

Spleen: Small complex collection along the posterior margin of the
spleen, possibly abscess, measuring 3.3 x 1.3 cm. Spleen otherwise
unremarkable.

Adrenals/Urinary Tract: Adrenal glands appear normal. Kidneys are
unremarkable without mass, stone or hydronephrosis. No ureteral or
bladder calculi. Bladder is unremarkable.

Stomach/Bowel: No dilated large or small bowel loops. No convincing
evidence of bowel wall thickening or bowel wall inflammation within
the abdomen or pelvis. There is at least some
thickening/inflammation of the walls of the herniated stomach and
lower esophagus, again incompletely imaged, likely reactive to the
adjacent fluid collection.

Vascular/Lymphatic: No significant vascular findings are present. No
enlarged abdominal or pelvic lymph nodes.

Reproductive: No adnexal mass or free fluid.

Other: No free intraperitoneal air.

Musculoskeletal: No acute or suspicious osseous finding.
IMPRESSION: 1. Reaccumulation of the previously demonstrated fluid collection in
the lower mediastinum, presumably abscess, likely multiloculated,
surrounding the herniated portion of the stomach and lower
esophagus, measuring 8 x 3 cm (transverse by AP dimensions),
craniocaudal extent incompletely imaged. The size of the fluid
collection is now similar to the size seen on CT abdomen of
09/16/2017, at which point there were drainage catheters in place.
2. Thickening of the walls of the herniated portion of the stomach
and the overlying esophagus, incompletely imaged, likely reactive to
the adjacent fluid collection/abscess.
3. Bibasilar pleural effusions, left greater than right,
incompletely imaged, with associated atelectasis.
4. Small complex collection along the posterior margin of the
spleen, possibly additional small abscess, measuring 3.3 x 1.3 cm.
5. No evidence of bowel obstruction within the abdomen or pelvis.

Recommend chest CT to determine the entire craniocaudal extent of
the mediastinal fluid collection.

These results were called by telephone at the time of interpretation
on 10/14/2017 at [DATE] to Dr. Suca, who verbally acknowledged
these results.

## 2018-10-28 IMAGING — CR DG CHEST 2V
3 series · 3 of 3 positions shown · non-contrast
Comparison: 10/05/2017 CT chest.  [DATE] CXR.

CLINICAL DATA: Dyspnea and possible sepsis.

EXAM:
CHEST  2 VIEW

[w chest lat]
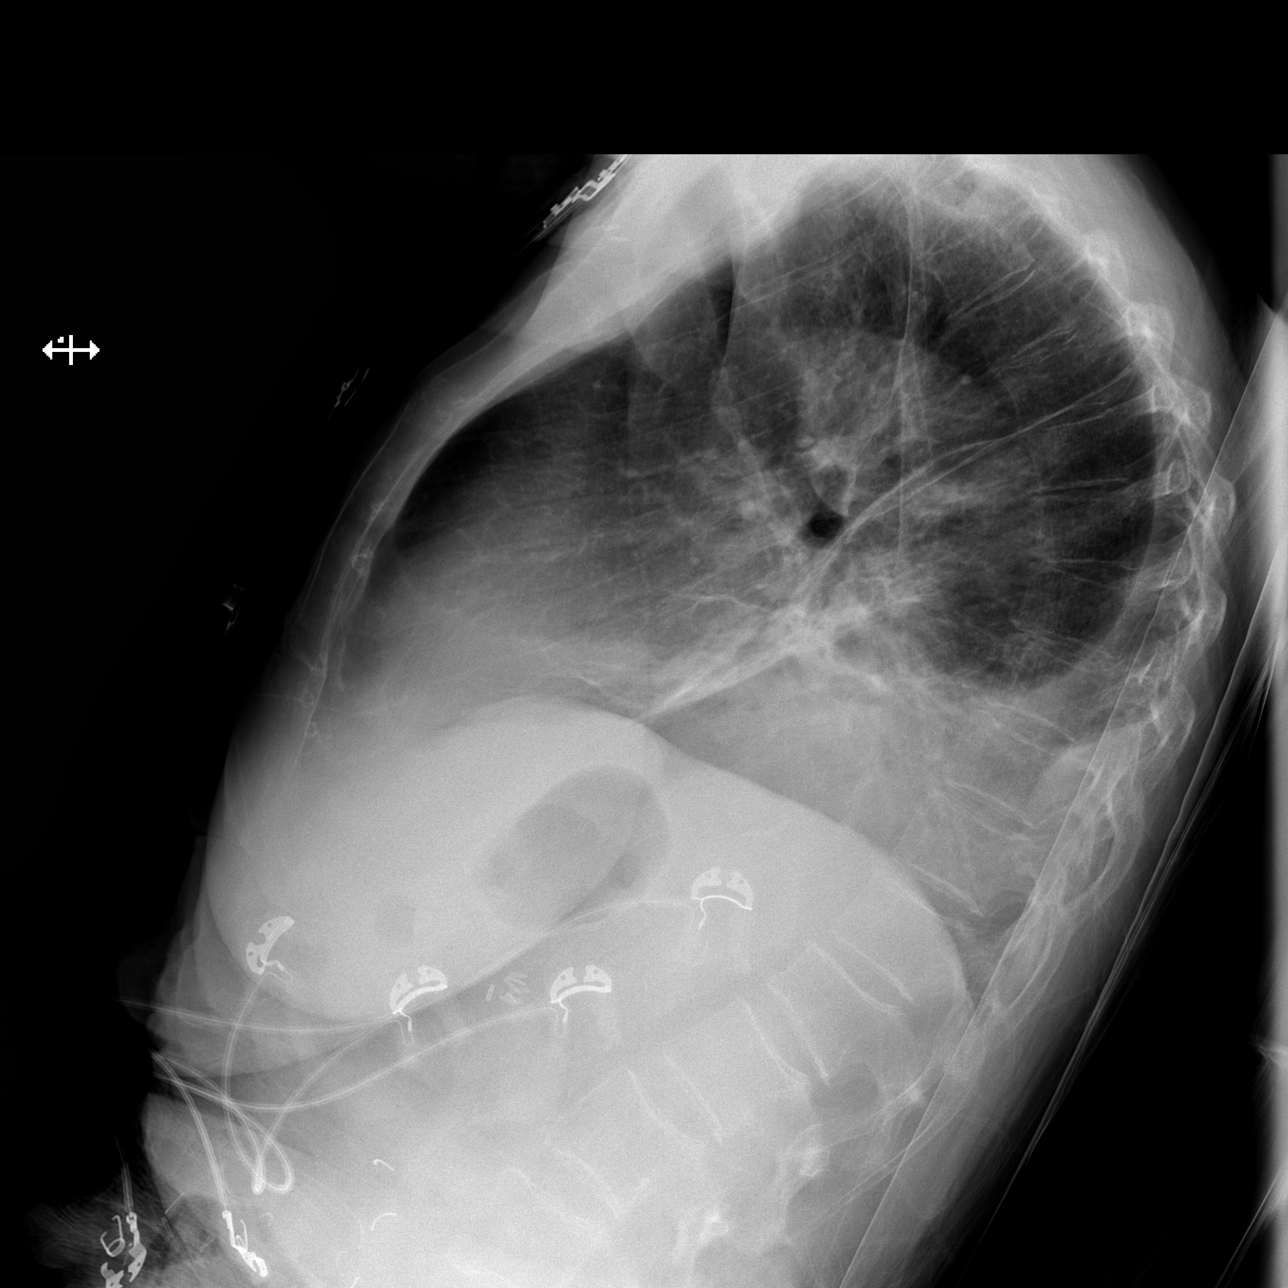

[x chest ap (1 of 2)]
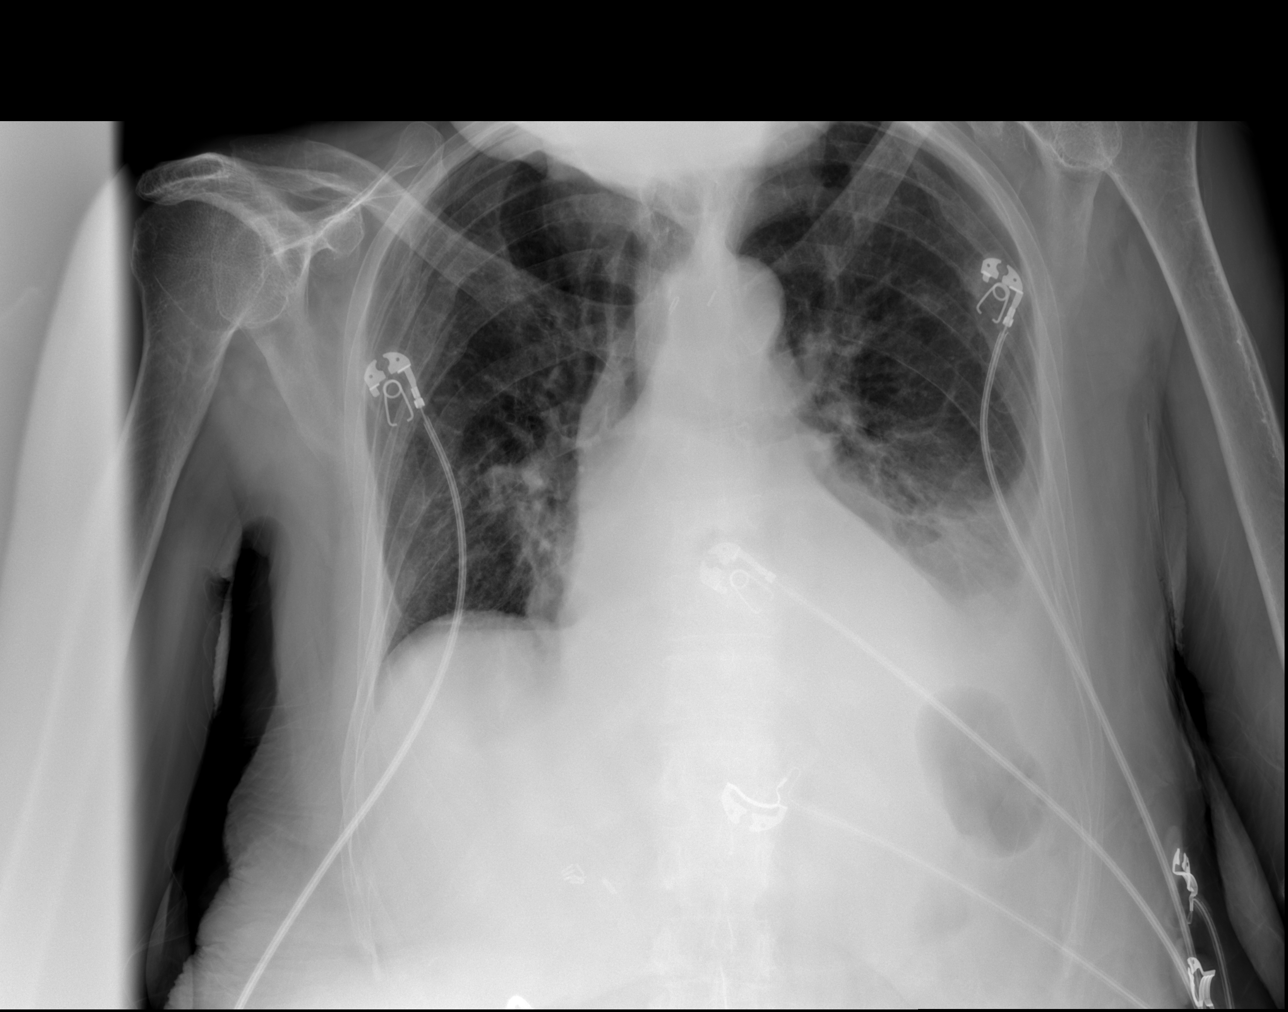

[x chest ap (2 of 2)]
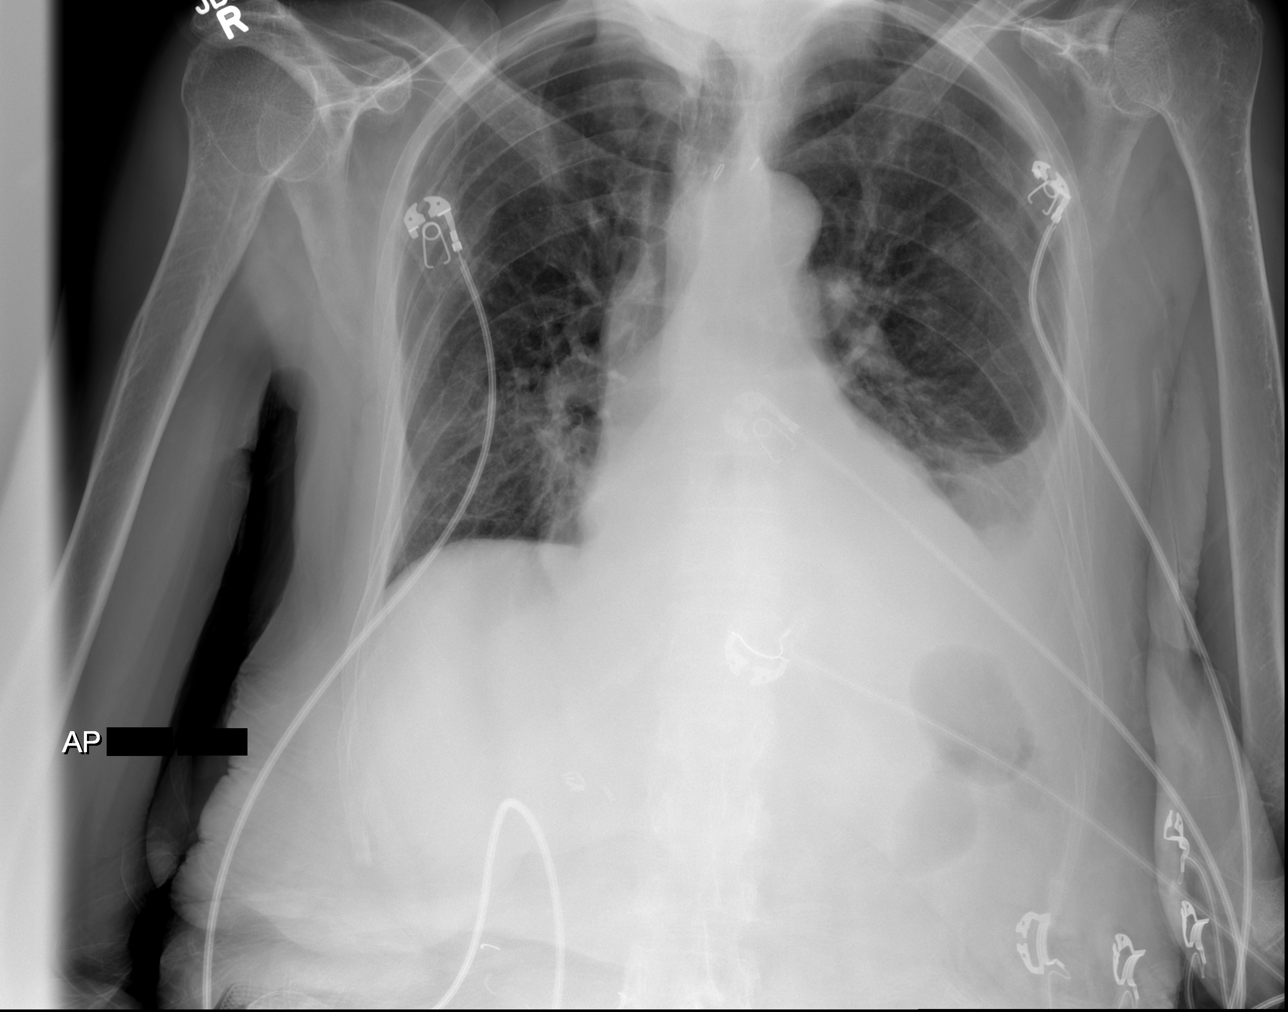

[3 of 3 positions shown; findings below may reference images not displayed]

FINDINGS: Borderline cardiomegaly with minimal aortic atherosclerosis. There
has been interval decrease in left-sided pleural effusion now small
to moderate. Probable compressive atelectasis at the left lung base
accounting for retrocardiac homogeneous opacity that still persists.
Mild central vascular congestion is again re- demonstrated. No acute
osseous abnormality. Chronic right-sided rib fractures. The
patient's chin obscures the lung apices. Left-sided PICC line has
been removed in the interim.
IMPRESSION: 1. Borderline cardiomegaly with interval decrease in left pleural
effusion. Persistent presumed compressive atelectasis at the left
lung base and mild central vascular congestion.
2. Mild aortic atherosclerosis.

## 2018-11-01 IMAGING — DX DG CHEST 1V PORT
1 series · 1 of 1 positions shown · non-contrast
Comparison: Chest radiograph performed earlier today at [DATE] p.m.

CLINICAL DATA: PICC repositioning.

EXAM:
PORTABLE CHEST 1 VIEW

[chest ap]
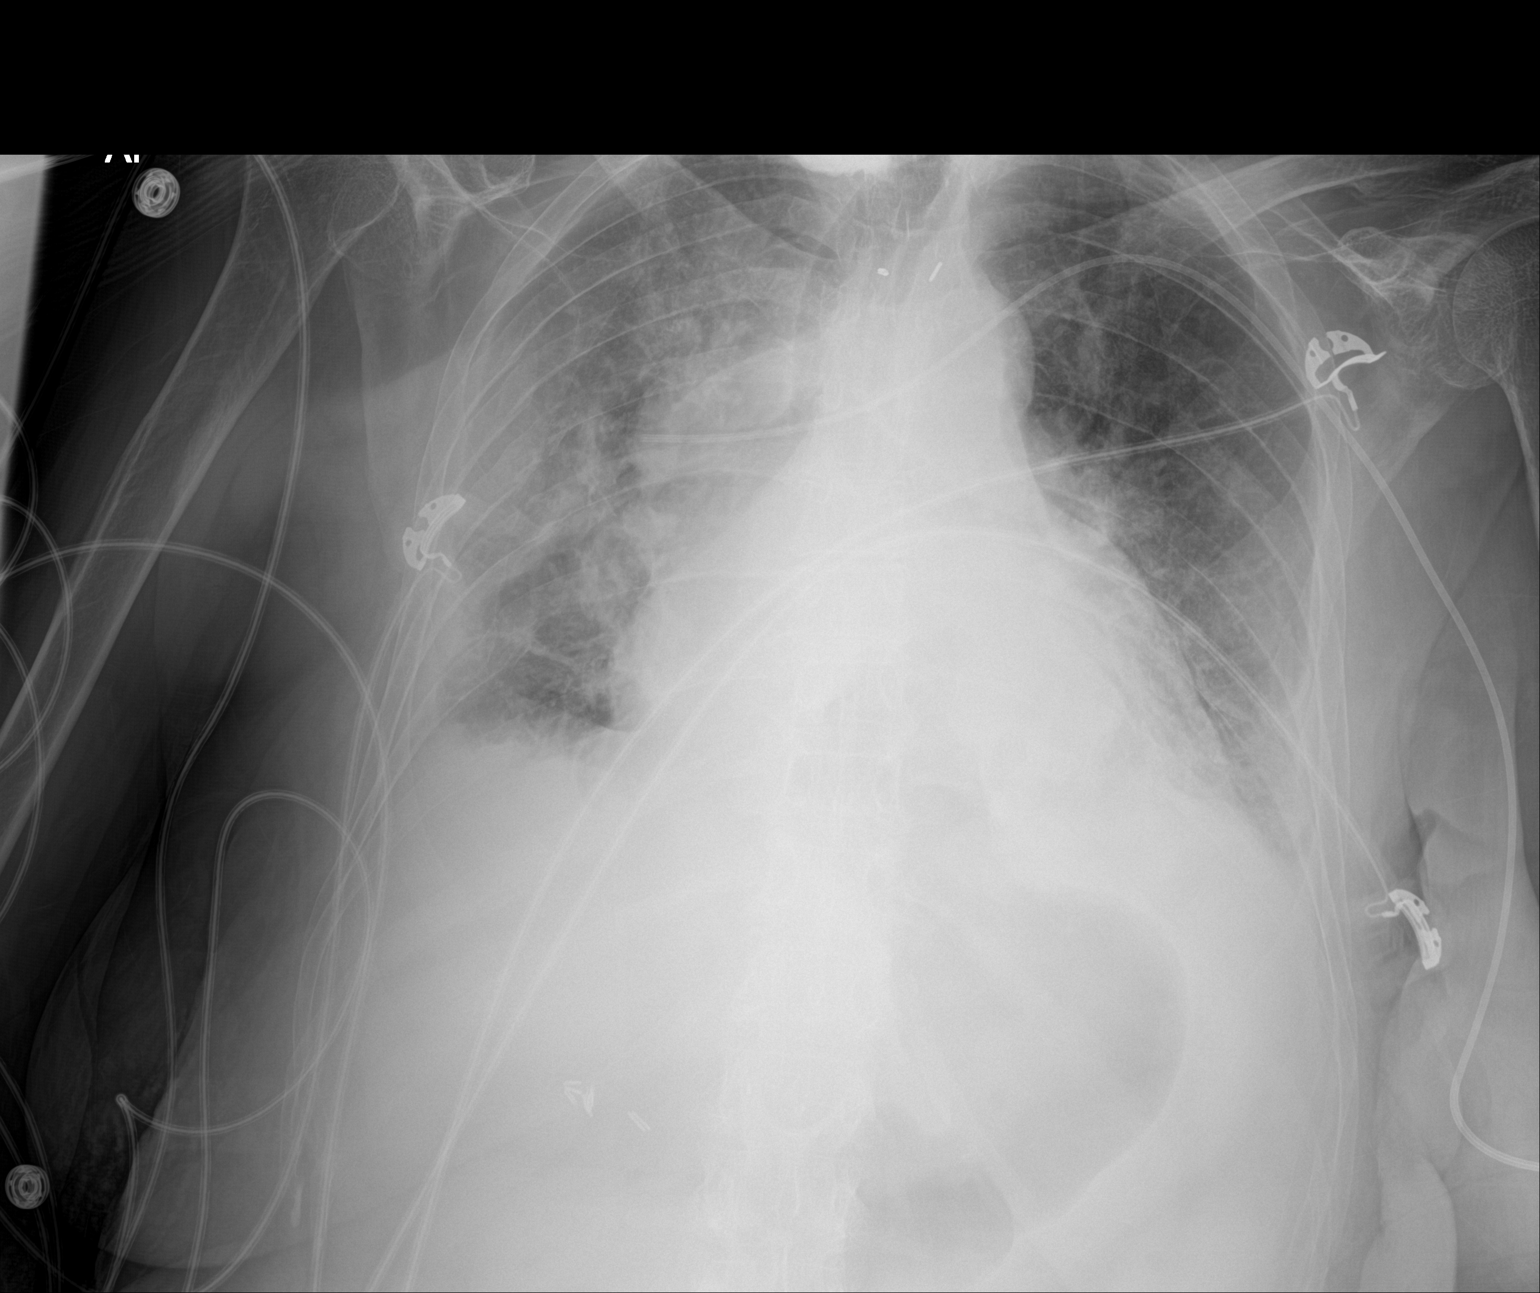

[1 of 1 positions shown; findings below may reference images not displayed]

FINDINGS: The lungs are mildly hypoexpanded. Small bilateral pleural effusions
are suspected. Vascular congestion is noted. Bilateral central
airspace opacities may reflect pulmonary edema or possibly
pneumonia. No pneumothorax is seen.

The cardiomediastinal silhouette is mildly enlarged. No acute
osseous abnormalities are identified.

The patient's left PICC is noted ending overlying the proximal SVC.
Previously noted coiling has resolved.
IMPRESSION: 1. Left PICC noted ending overlying the proximal SVC. Previously
noted coiling has resolved.
2. Lungs mildly hypoexpanded. Vascular congestion and mild
cardiomegaly. Bilateral central airspace opacities may reflect
pulmonary edema or possibly pneumonia.

## 2018-11-01 IMAGING — DX DG CHEST 1V PORT
1 series · 1 of 1 positions shown · non-contrast
Comparison: 10/14/2017

CLINICAL DATA: Status post left thoracentesis.

EXAM:
PORTABLE CHEST 1 VIEW

[chest ap]
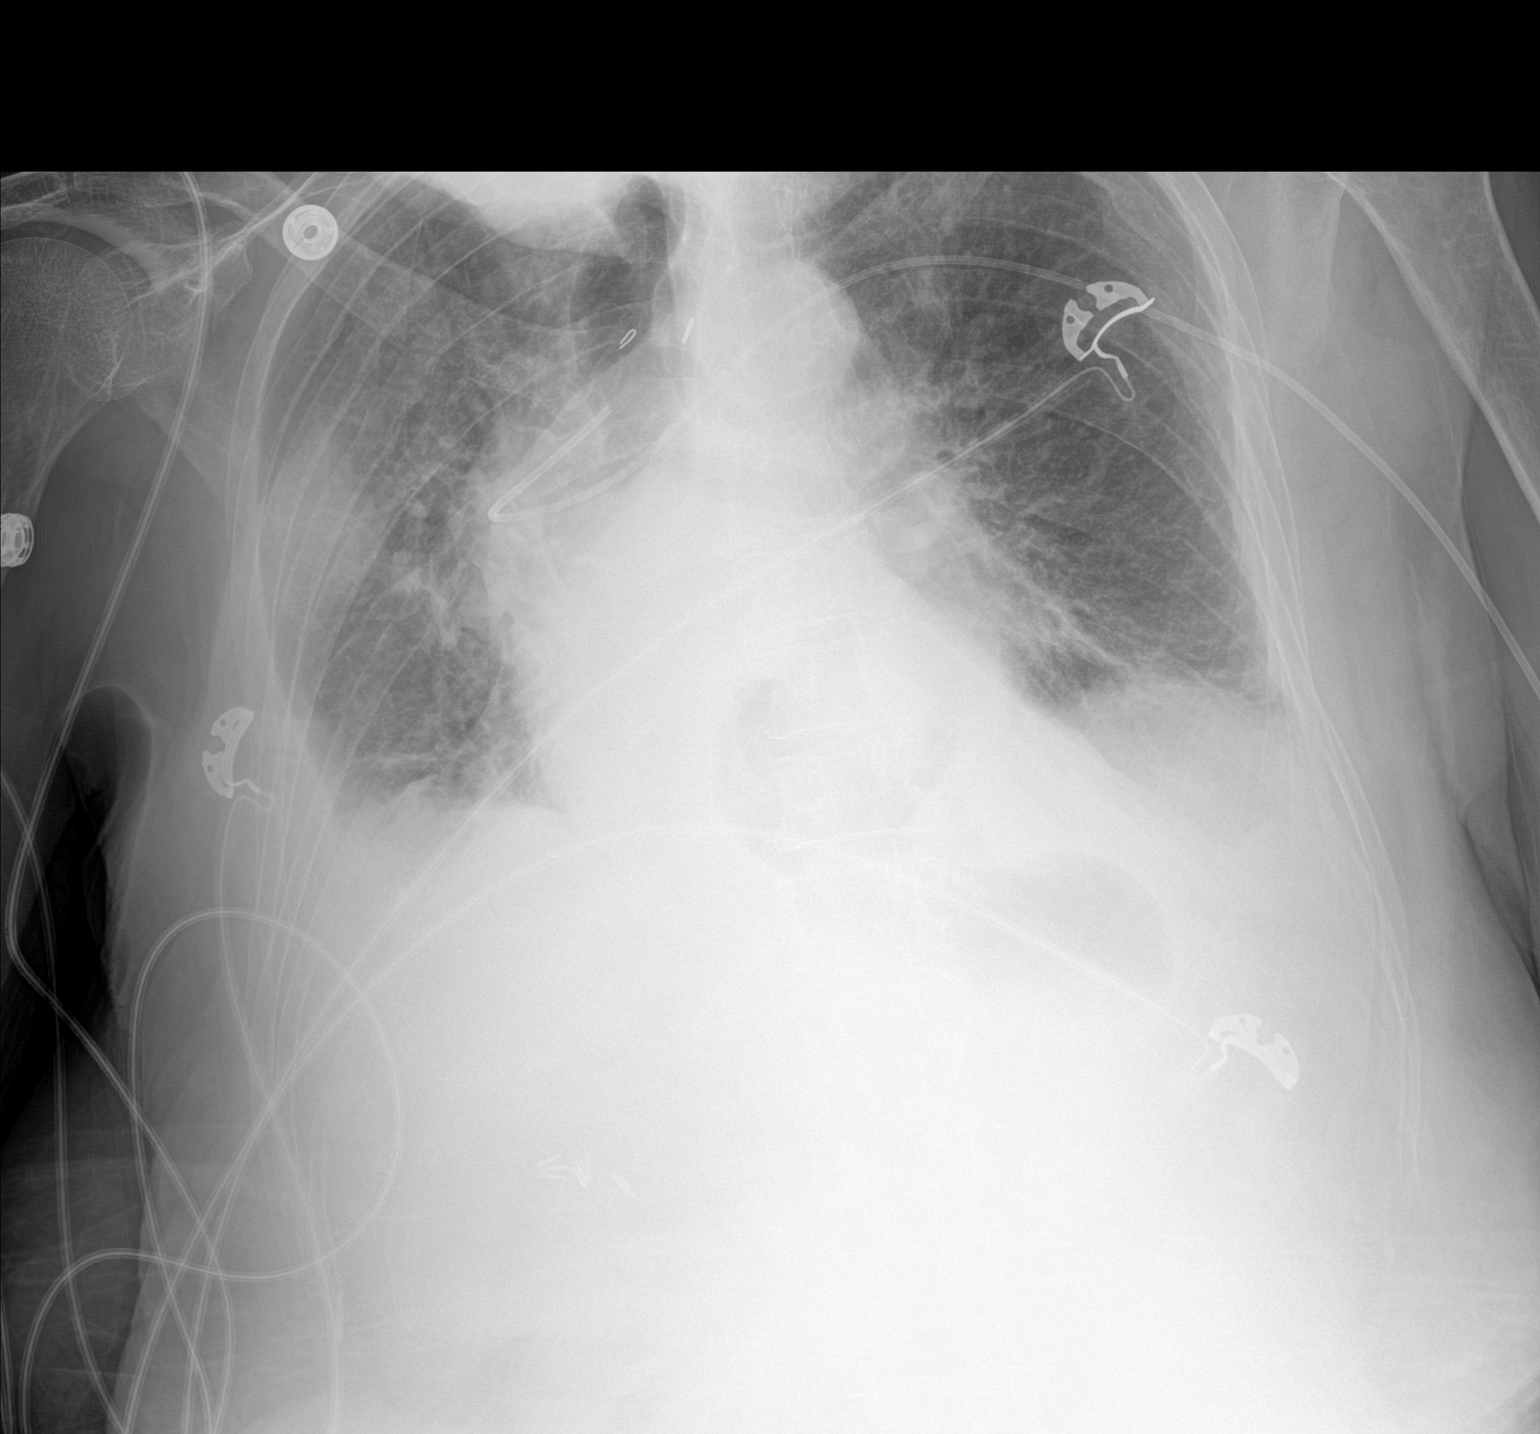

[1 of 1 positions shown; findings below may reference images not displayed]

FINDINGS: There is decreased pleural fluid on the left following
thoracentesis. Residual left lung base opacity is likely
atelectasis.

There is no pneumothorax.

There is opacity at the right lung base mostly obscuring
hemidiaphragm consistent with a small right pleural effusion,
new/increased when compared to the prior exam.

A left PICC has been placed since the prior exam. Tip extends into
the brachiocephalic vein but curls back on itself. It is unclear
based on this single rotated AP view with this remains in the
superior vena cava or extends back into the brachiocephalic vein.
IMPRESSION: 1. Decreased left pleural fluid following thoracentesis. No
pneumothorax or evidence of a procedure complication.
2. Left PICC positioned as detailed above, tip curling back on
itself.
3. Small right pleural effusion.

## 2018-11-01 IMAGING — DX DG CHEST 1V
1 series · 1 of 1 positions shown · non-contrast
Comparison: 10/18/2017

CLINICAL DATA: PICC line placement

EXAM:
CHEST 1 VIEW

[chest ap]
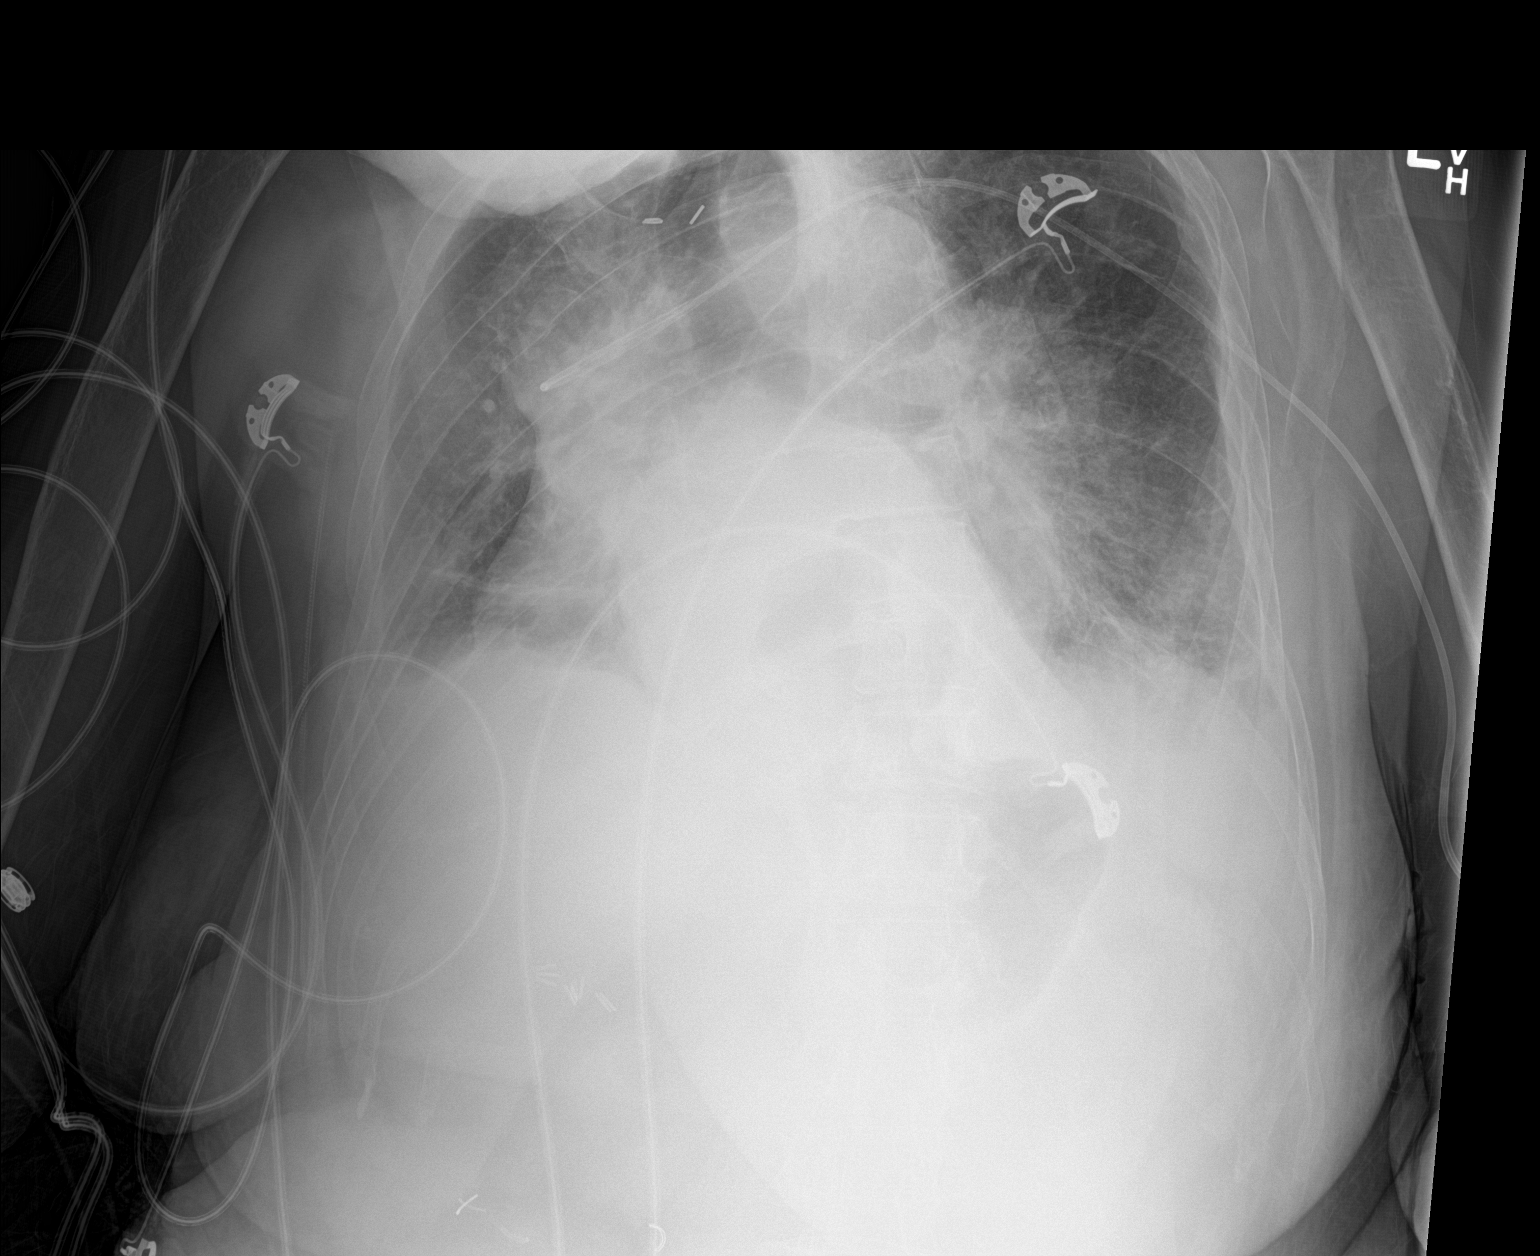

[1 of 1 positions shown; findings below may reference images not displayed]

FINDINGS: Limited by positioning. Left upper extremity catheter tip is looped
back upon itself over the brachiocephalic region. Small pleural
effusions. Stable cardiomegaly with vascular congestion and
bibasilar airspace disease. Hiatal hernia.
IMPRESSION: 1. Left upper extremity catheter tip is folded back upon itself over
the brachiocephalic region
2. Small bilateral pleural effusions with bibasilar atelectasis or
pneumonia.
3. Cardiomegaly with vascular congestion

## 2018-11-01 IMAGING — US US THORACENTESIS ASP PLEURAL SPACE W/IMG GUIDE
1 series · 3 of 3 positions shown · non-contrast
Comparison: none

INDICATION: Patient with history of mediastinal abscess, Crohn's disease, left
pleural effusion. Request made for diagnostic and therapeutic left
thoracentesis.

[Series 1: us thoracentesis asp pleural space w/img guide · 0.26mm/px · 3 of 3 slices shown]
[im 1/3]
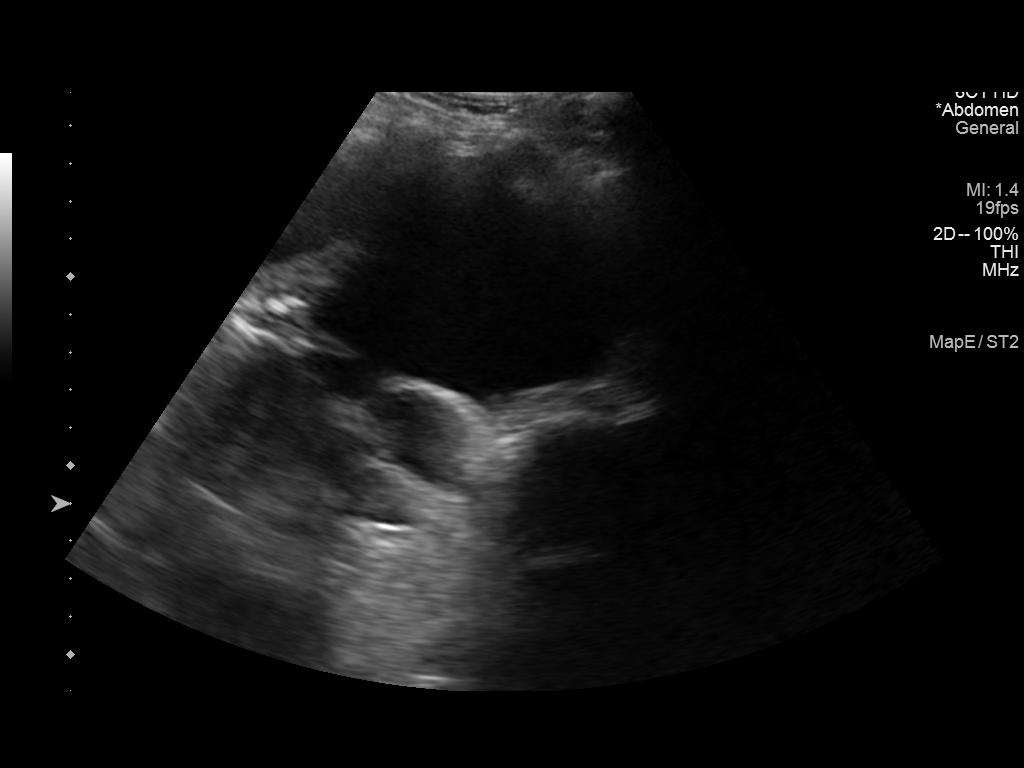
[im 2/3]
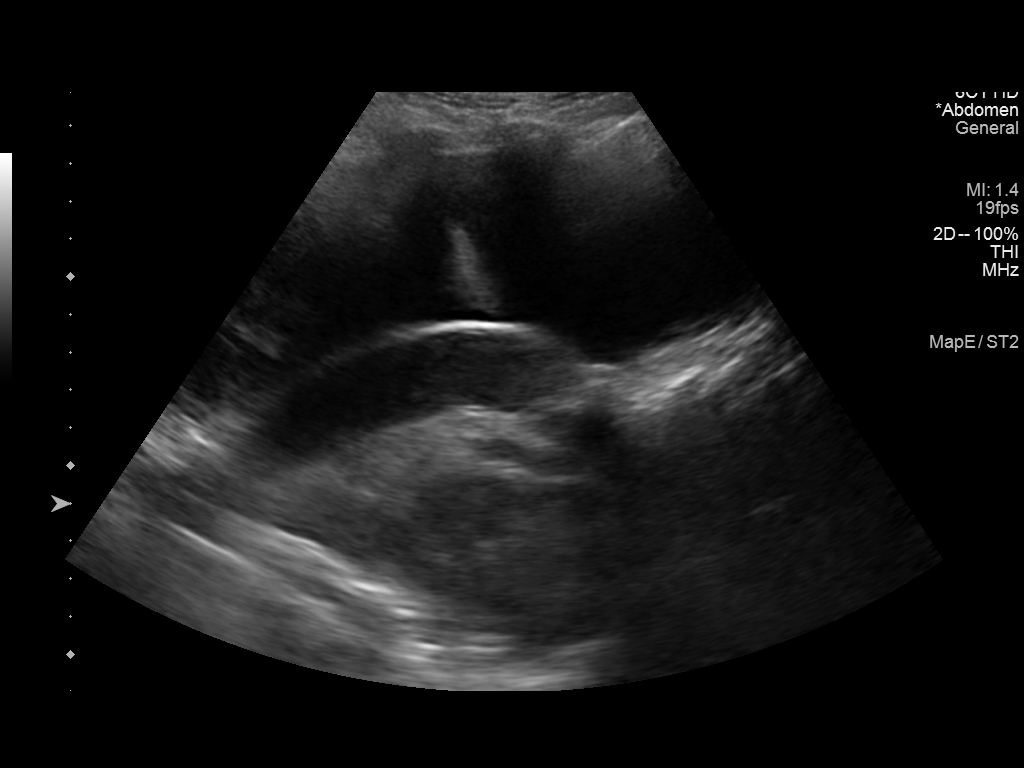
[im 3/3]
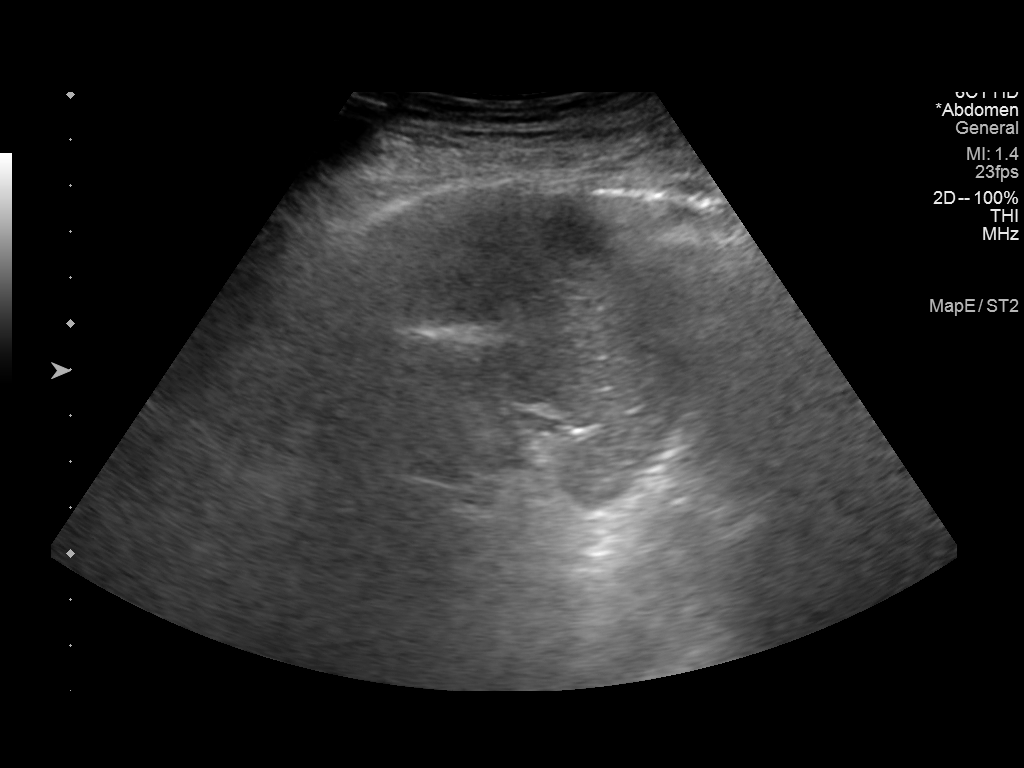

[3 of 3 positions shown; findings below may reference images not displayed]

EXAM:
ULTRASOUND GUIDED DIAGNOSTIC AND THERAPEUTIC LEFT THORACENTESIS

MEDICATIONS:
None.

COMPLICATIONS:
None immediate.

PROCEDURE:
An ultrasound guided thoracentesis was thoroughly discussed with the
patient and questions answered. The benefits, risks, alternatives
and complications were also discussed. The patient understands and
wishes to proceed with the procedure. Written consent was obtained.

Ultrasound was performed to localize and mark an adequate pocket of
fluid in the left chest. The area was then prepped and draped in the
normal sterile fashion. 1% Lidocaine was used for local anesthesia.
Under ultrasound guidance a Safe-T-Centesis catheter was introduced.
Thoracentesis was performed. The catheter was removed and a dressing
applied.
FINDINGS: A total of approximately 780 cc of slightly hazy, blood-tinged fluid
was removed. Samples were sent to the laboratory as requested by the
clinical team.
IMPRESSION: Successful ultrasound guided diagnostic and therapeutic left
thoracentesis yielding 780 cc of pleural fluid.

## 2018-11-04 IMAGING — CR DG CHEST 1V PORT
1 series · 1 of 1 positions shown · non-contrast
Comparison: 3871 hours today and earlier.

CLINICAL DATA: 70-year-old female postop day zero VATS for possible
mediastinal abscess.

Status post ultrasound-guided left thoracentesis 3 days ago.
EXAM:
PORTABLE CHEST 1 VIEW

[AP]
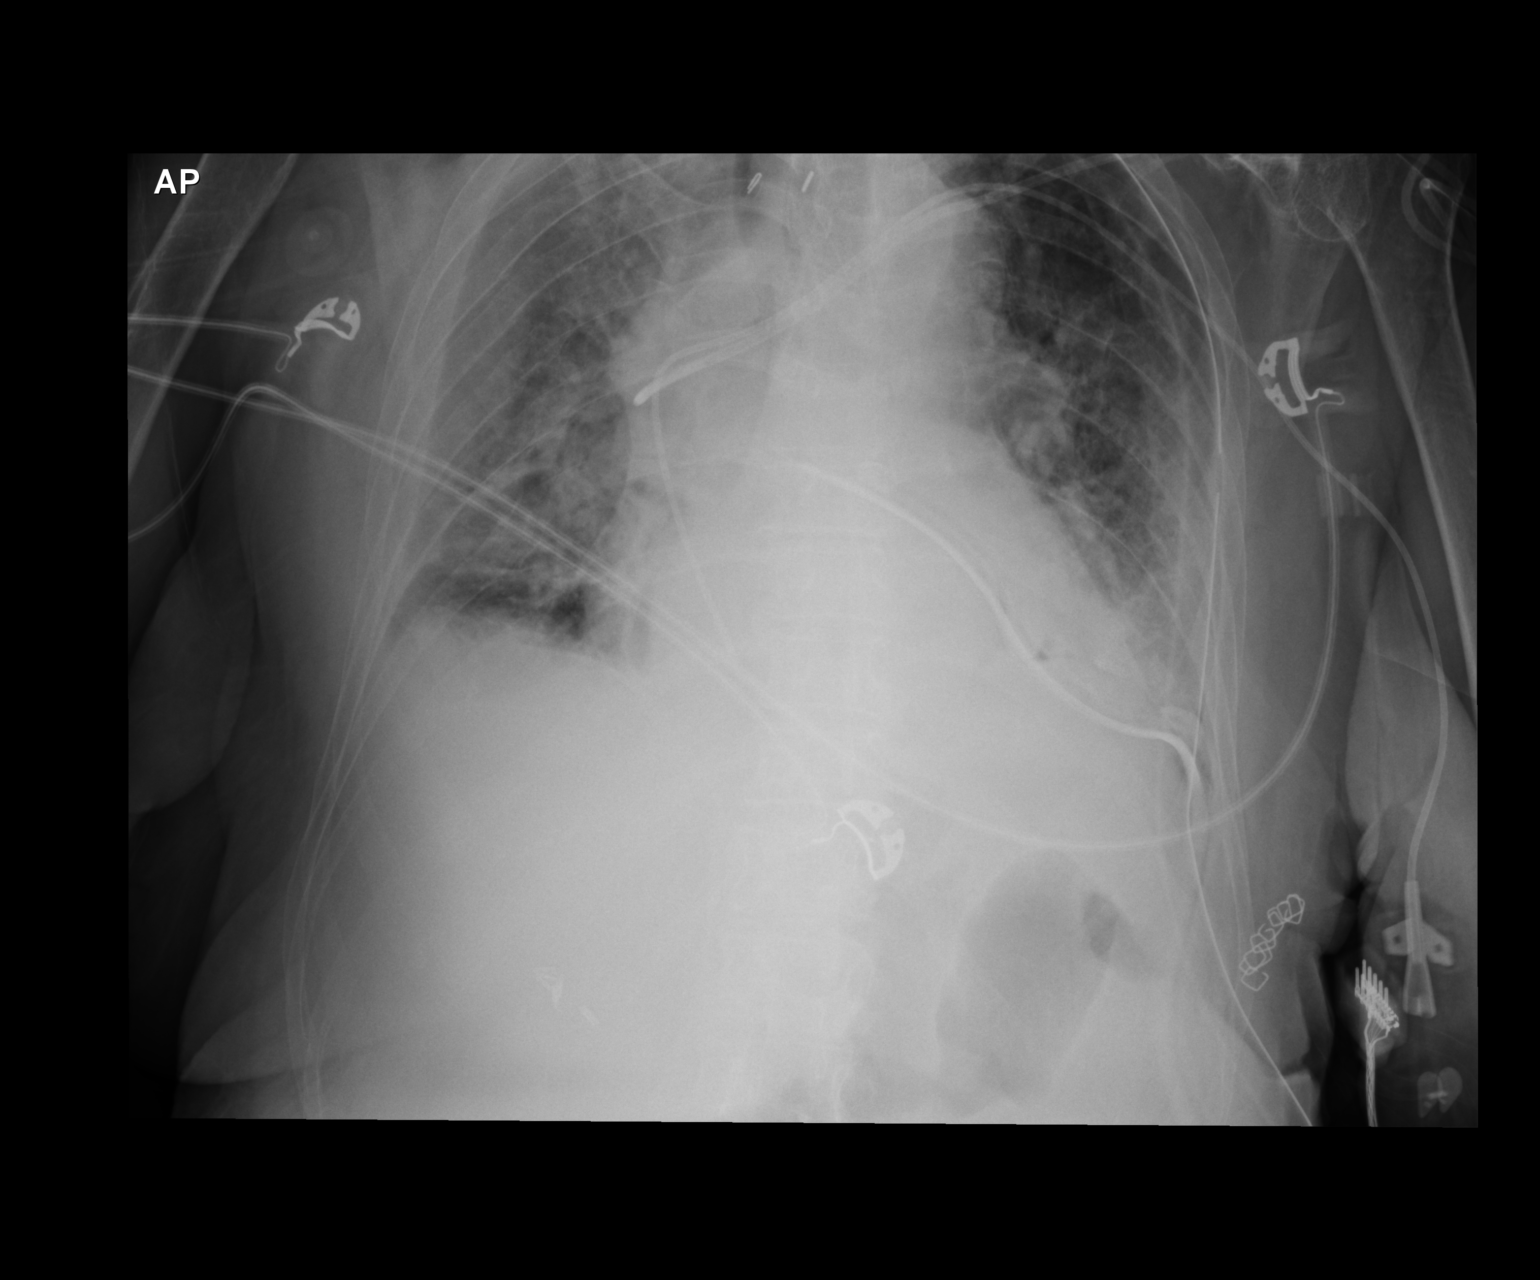

[1 of 1 positions shown; findings below may reference images not displayed]

FINDINGS: Portable AP semi upright view at 8725 hours.

There is a chest tube coursing to the central mediastinal contour. A
second chest tube courses in the left lateral chest up to the left
lung apex. No pneumothorax. Suspected small volume pneumomediastinum
which is likely postoperative. The left upper extremity PICC line
now appears to course to the right atrium level. There is a new left
subclavian approach catheter or tube of unclear significance.

Larger lung volumes. Overall stable mediastinal contour. Patchy
perihilar and lung base opacity has not significantly changed.
Possible small right pleural effusion.

Negative visible bowel gas pattern.  Stable cholecystectomy clips.
IMPRESSION: 1. Mediastinal in left chest tube in place with no pneumothorax.
Small postoperative pneumomediastinum suspected. Stable mediastinal
contour.
2. Patchy perihilar and lung base opacity not significantly changed.
3. Left upper extremity PICC line catheter tip now at the right
atrium level.

## 2018-11-04 IMAGING — DX DG CHEST 1V PORT
1 series · 1 of 1 positions shown · non-contrast
Comparison: 10/18/2017 and multiple previous.

CLINICAL DATA: Followup pleural effusion.

EXAM:
PORTABLE CHEST 1 VIEW

[chest ap]
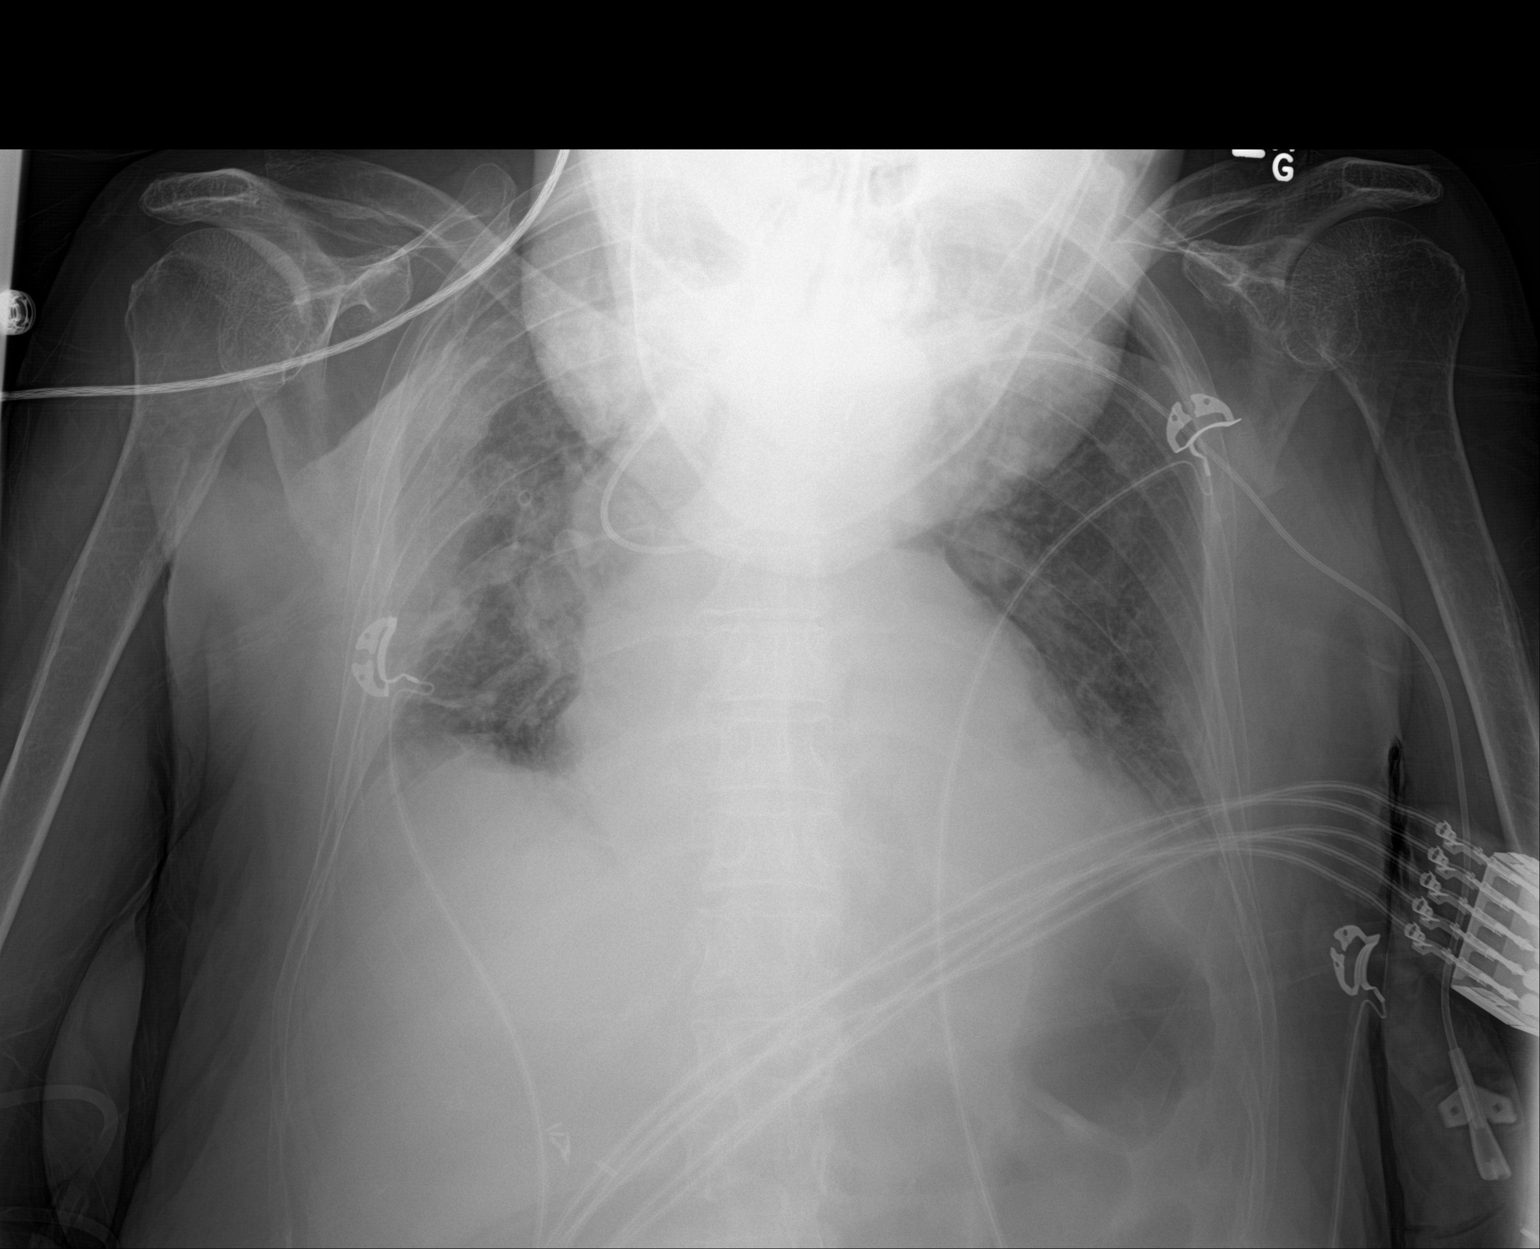

[1 of 1 positions shown; findings below may reference images not displayed]

FINDINGS: Patient would not agree to raise her chin. Left arm PICC tip is
probably unchanged in the SVC at the azygos level. Small amount of
pleural fluid persists on the left and possibly on the right.
Atelectasis in both lower lungs persists, left worse than right.
Volume loss may be slightly worsened in the left lower lobe. Lap for
technical factors, no other change appreciated since the study of 3
days ago.
IMPRESSION: Probable small effusions. Lower lobe atelectasis left worse than
right, probably with some worsening on the left since the study of 3
days ago.

## 2018-11-05 IMAGING — DX DG CHEST 1V PORT
1 series · 1 of 1 positions shown · non-contrast
Comparison: 10/21/2017.

CLINICAL DATA: Mediastinum abscess.  Shortness of breath .

EXAM:
PORTABLE CHEST 1 VIEW

[chest]
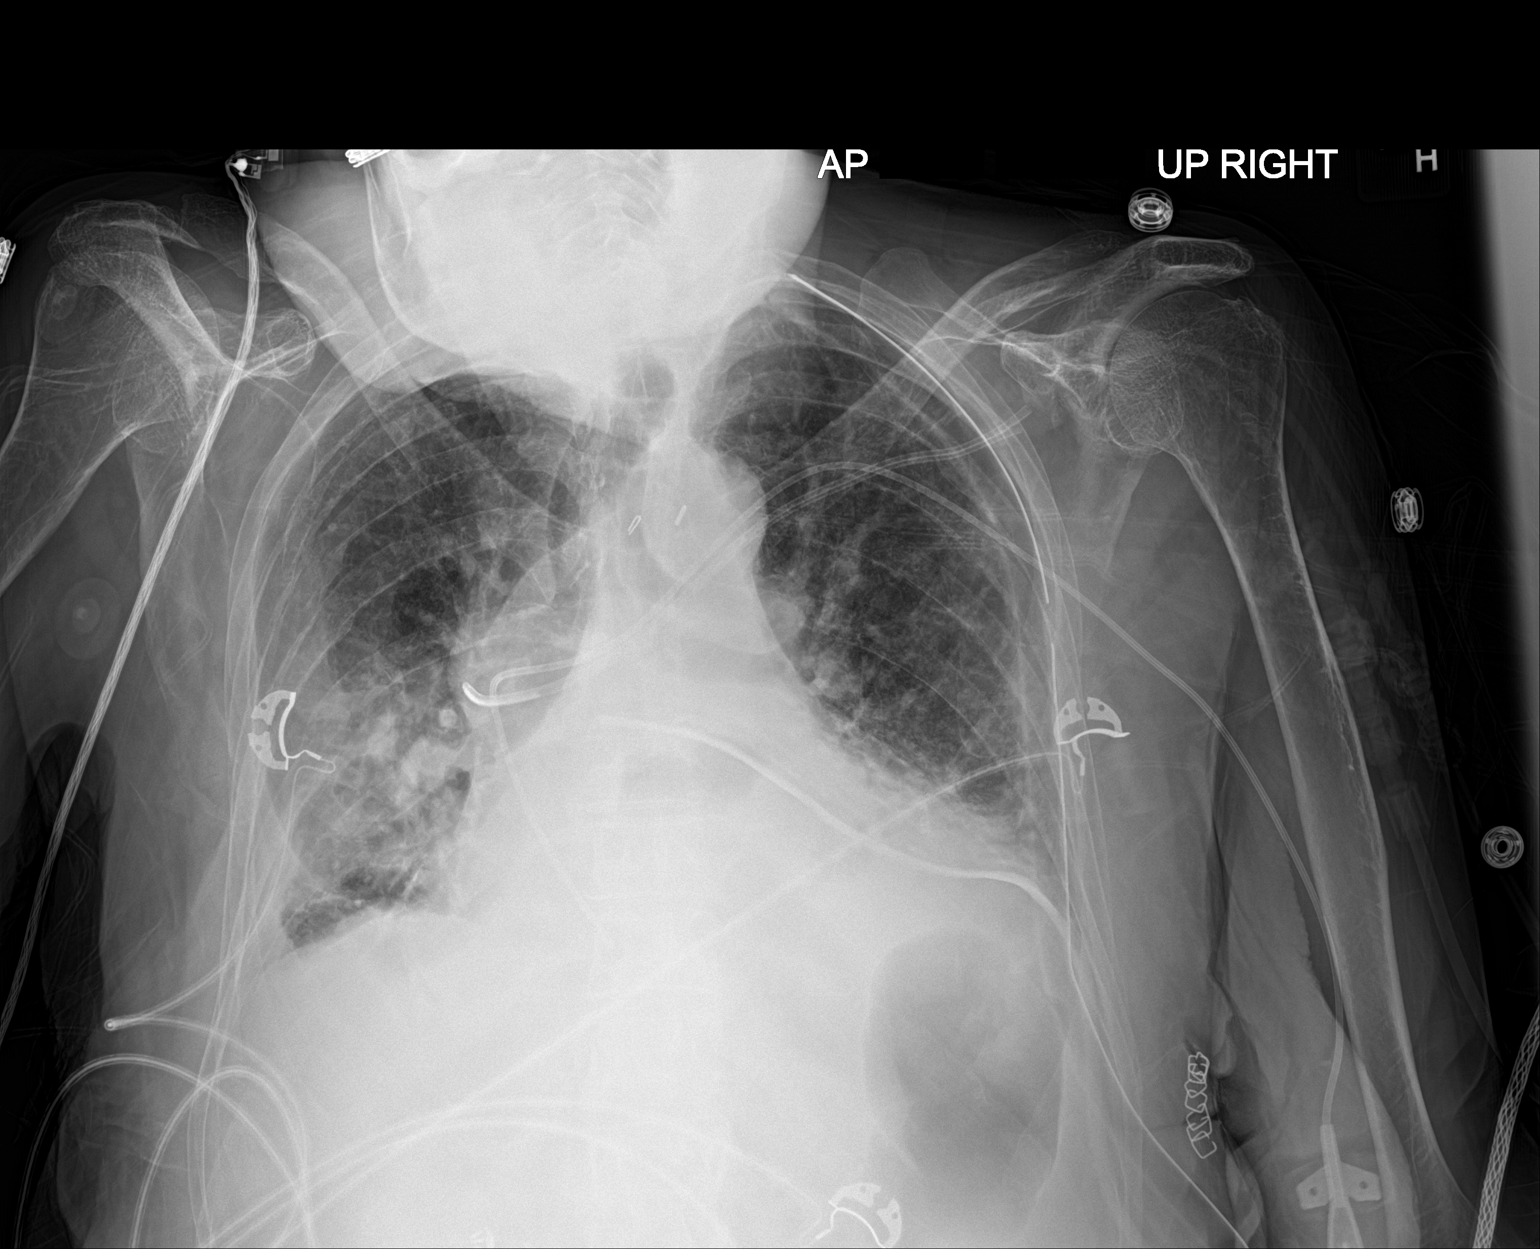

[1 of 1 positions shown; findings below may reference images not displayed]

FINDINGS: Left subclavian line, left PICC line, 2 left chest tubes in stable
position. Stable cardiomegaly. Pulmonary vascular prominence and
bilateral interstitial prominence and bilateral pleural effusions
noted. Findings consistent with CHF. Surgical clips are noted over
the chest. Hiatal hernia.
IMPRESSION: 1. Lines and tubes including 2 left chest tubes in stable position.
No pneumothorax.

2. Cardiomegaly with basilar pulmonary infiltrates and bilateral
pleural effusions consistent CHF. Bibasilar pneumonia cannot be
excluded.

3.  Bibasilar atelectasis.

## 2018-11-07 IMAGING — CR DG CHEST 1V PORT
1 series · 1 of 1 positions shown · non-contrast
Comparison: Chest radiograph performed 10/22/2017

CLINICAL DATA: Acute onset of fever, vomiting and cough. Concern
for aspiration. Patient has left-sided chest tubes.

EXAM:
PORTABLE CHEST 1 VIEW

[AP]
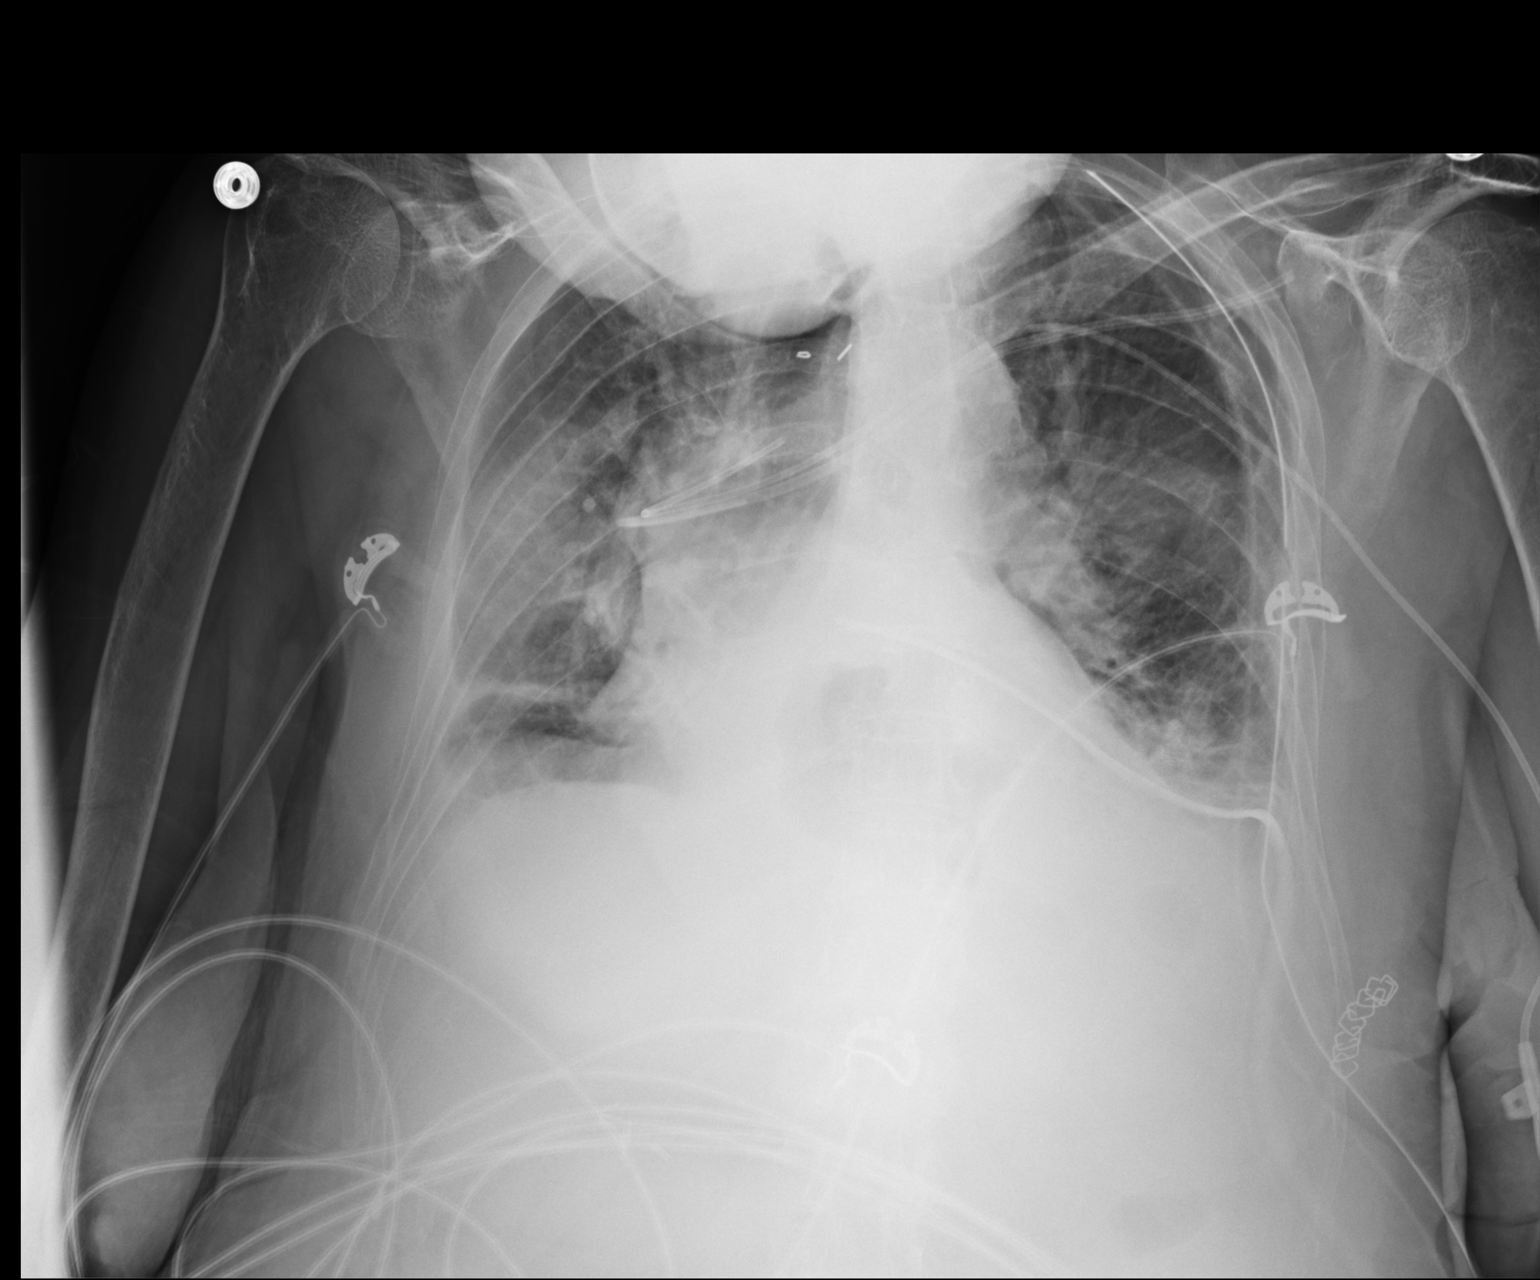

[1 of 1 positions shown; findings below may reference images not displayed]

FINDINGS: Bilateral mid and lower lung zone airspace opacities appear mildly
worsened and could reflect aspiration pneumonia, given clinical
concern. Small bilateral pleural effusions are suspected. No
pneumothorax is seen, though the lung apices are obscured by the
patient's head. Left apical and left basilar chest tubes are
unchanged in appearance.

The cardiomediastinal silhouette is mildly enlarged. No acute
osseous abnormalities are seen. A large hiatal hernia is noted.
IMPRESSION: 1. Bilateral mid and lower lung zone airspace opacities appear
mildly worsened and could reflect aspiration pneumonia, given
clinical concern.
2. Suspect small bilateral pleural effusions.
3. Mild cardiomegaly.
4. Large hiatal hernia.

## 2018-11-07 IMAGING — DX DG CHEST 2V
2 series · 2 of 2 positions shown · non-contrast
Comparison: Chest x-rays dated 10/24/2016 and 10/22/2017.

CLINICAL DATA: Hemoptysis

EXAM:
CHEST  2 VIEW

[chest lat]
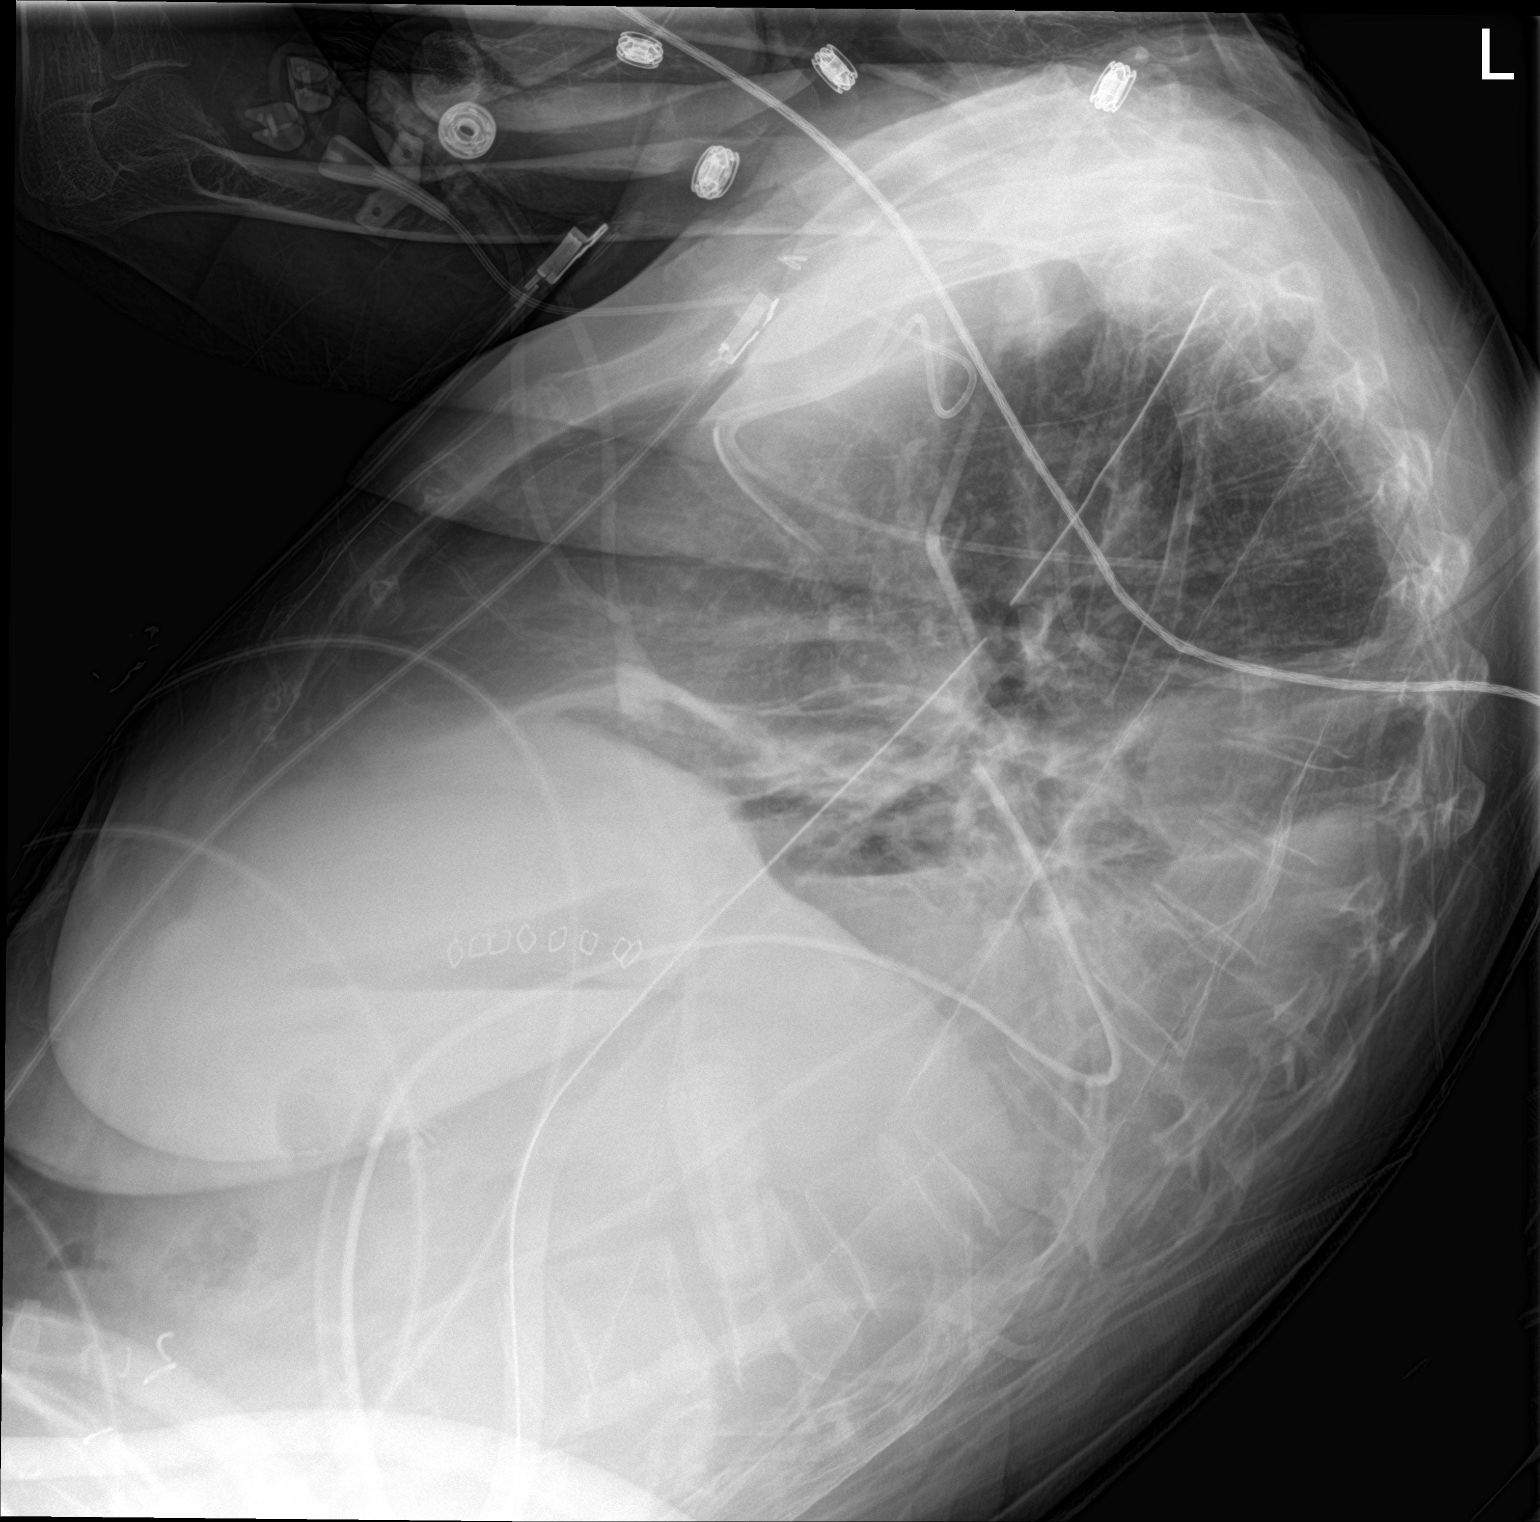

[chest ap]
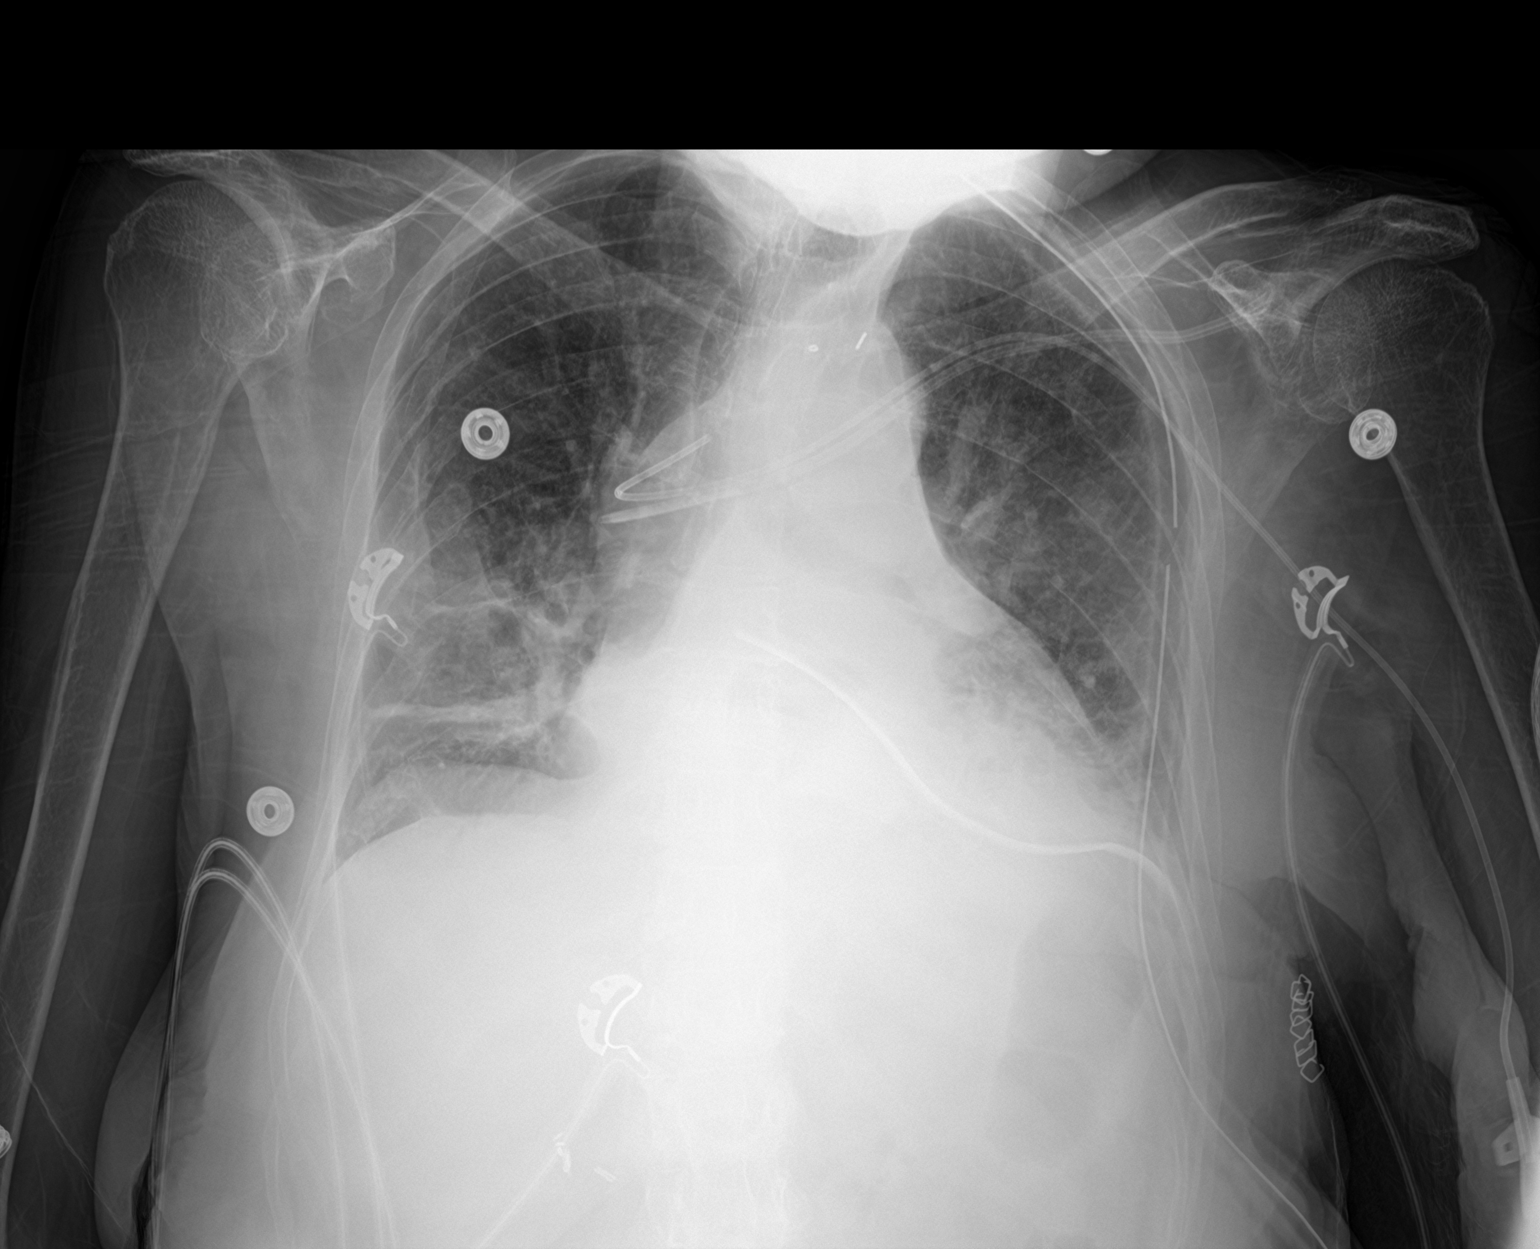

[2 of 2 positions shown; findings below may reference images not displayed]

FINDINGS: Heart size and mediastinal contours are stable. Lines and tubes are
stable in the short-term interval. Bibasilar opacities are stable.
Probable small left pleural effusion. Hiatal hernia better
demonstrated on earlier exams.
IMPRESSION: 1. No interval change. Patchy bibasilar opacities are stable,
atelectasis versus aspiration. Probable small left pleural effusion.
2. Lines and tubes are stable in position.

## 2018-11-07 IMAGING — CR DG ABD PORTABLE 1V
1 series · 1 of 1 positions shown · non-contrast
Comparison: CT of the abdomen and pelvis performed 10/14/2017

CLINICAL DATA: Acute onset of fever, vomiting and cough.

EXAM:
PORTABLE ABDOMEN - 1 VIEW

[AP]
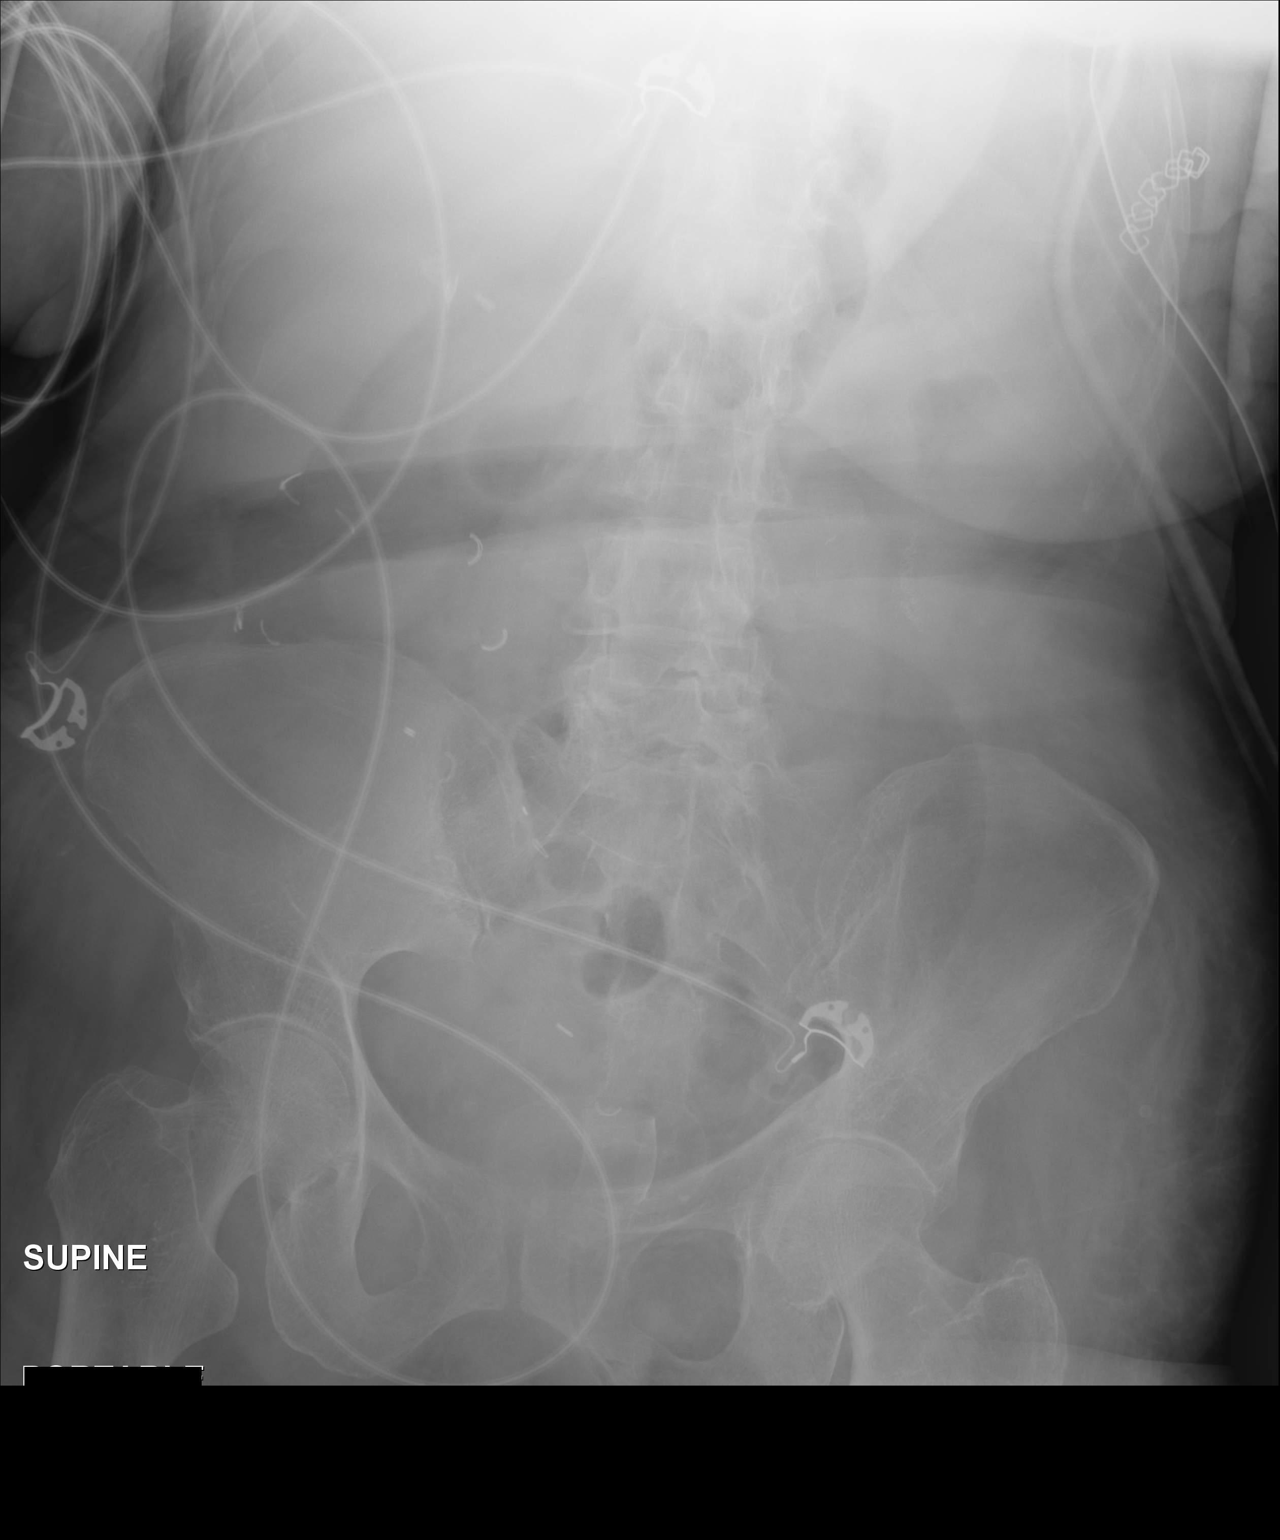

[1 of 1 positions shown; findings below may reference images not displayed]

FINDINGS: The visualized bowel gas pattern is unremarkable. Scattered air and
stool filled loops of colon are seen; no abnormal dilatation of
small bowel loops is seen to suggest small bowel obstruction. No
free intra-abdominal air is identified, though evaluation for free
air is limited on a single supine view. Postoperative change is
noted about the right side of the abdomen and pelvis.

The visualized osseous structures are within normal limits; the
sacroiliac joints are unremarkable in appearance.
IMPRESSION: Unremarkable bowel gas pattern; no free intra-abdominal air seen.
Small amount of stool noted in the colon.

## 2018-11-08 IMAGING — CT CT CHEST W/ CM
2 of 3 series · 15 of 36 positions shown, 18 images · IV contrast (APPLIED)
Comparison: 10/05/2017 CT of the chest.

CLINICAL DATA: 70 y/o F; history of paraesophageal hernia repair
09/08/2017 and mediastinal abscess post vats and drainage
10/21/2017.

EXAM:
CT CHEST WITH CONTRAST
TECHNIQUE: Multidetector CT imaging of the chest was performed during
intravenous contrast administration.
CONTRAST:  72 cc [id number]

[Series 3: thorax 2.0 i31f 2 · axial · 0.65mm/px · z∈[+1402,+1610]mm · 12 of 124 slices shown, 15 images]
[im 10/124  mediastinal]
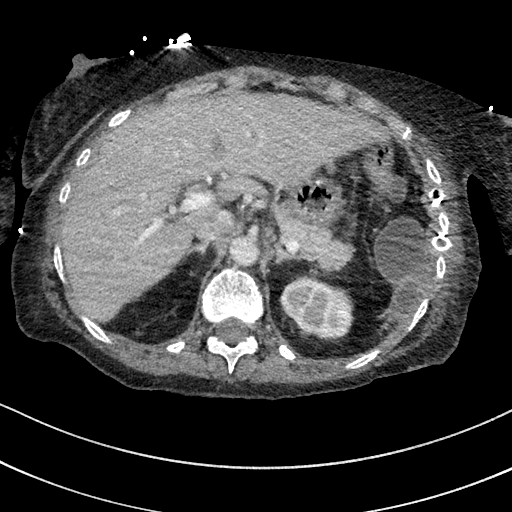
[im 10/124  lung]
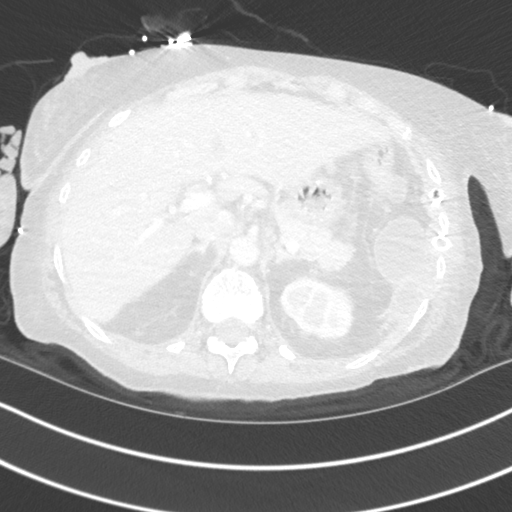
[im 19/124  lung]
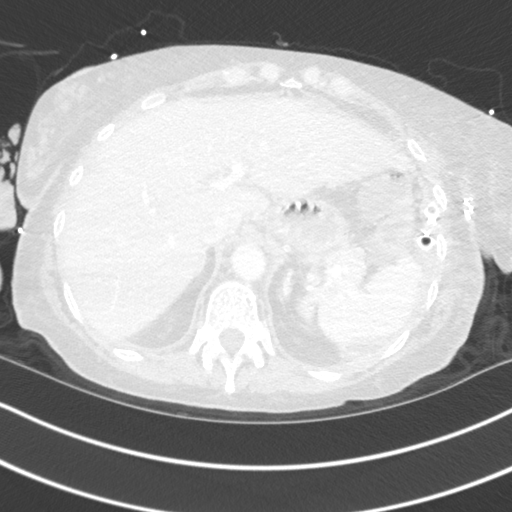
[im 28/124  lung]
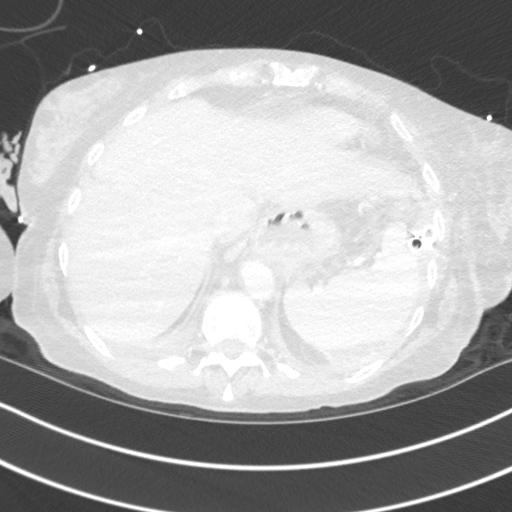
[im 37/124  lung]
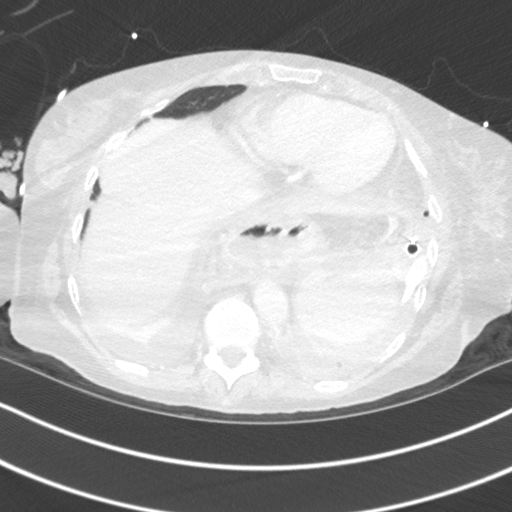
[im 46/124  mediastinal]
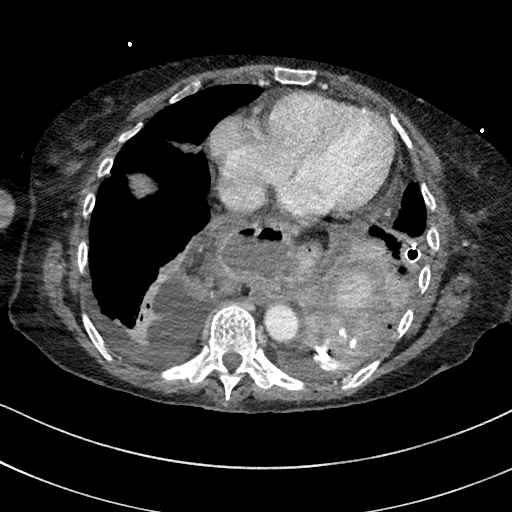
[im 46/124  lung]
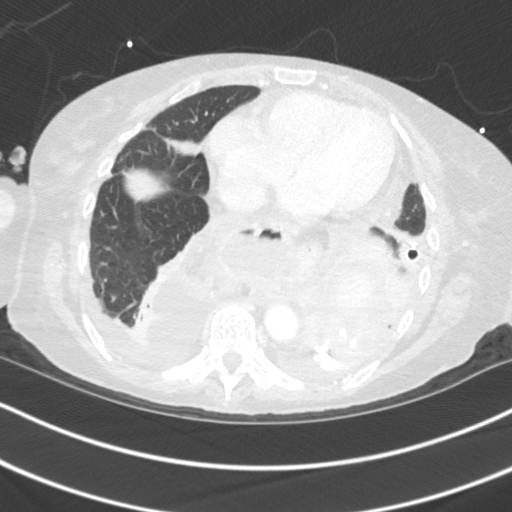
[im 55/124  lung]
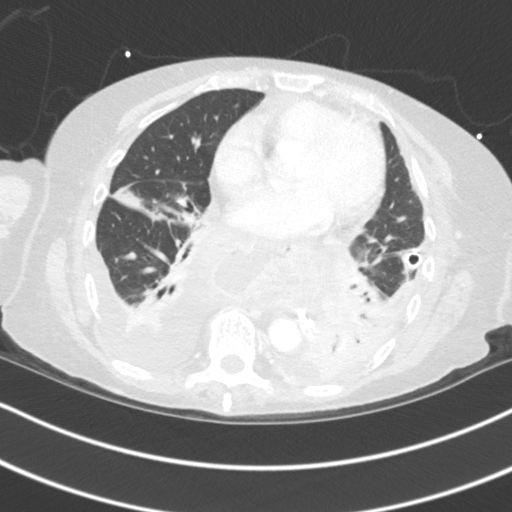
[im 69/124  lung]
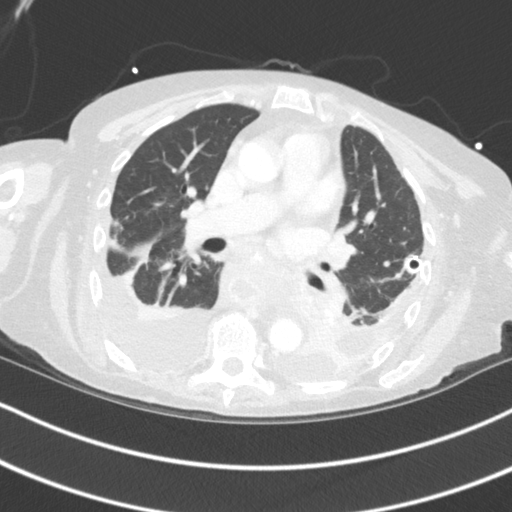
[im 78/124  lung]
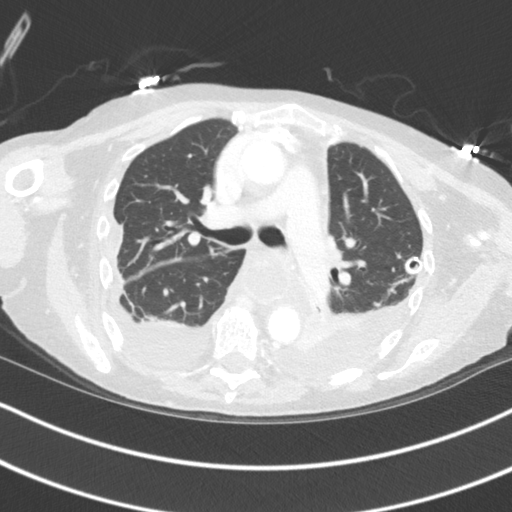
[im 87/124  mediastinal]
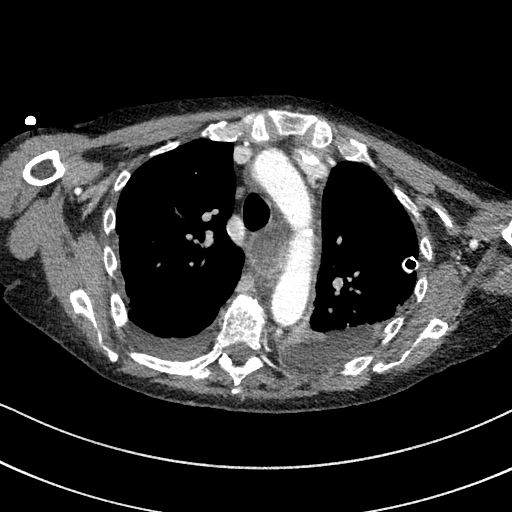
[im 87/124  lung]
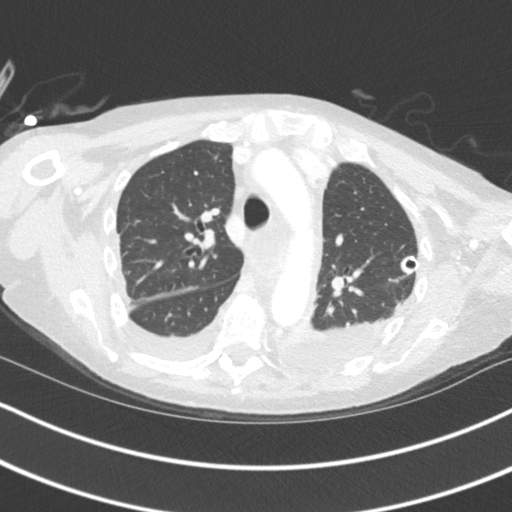
[im 96/124  lung]
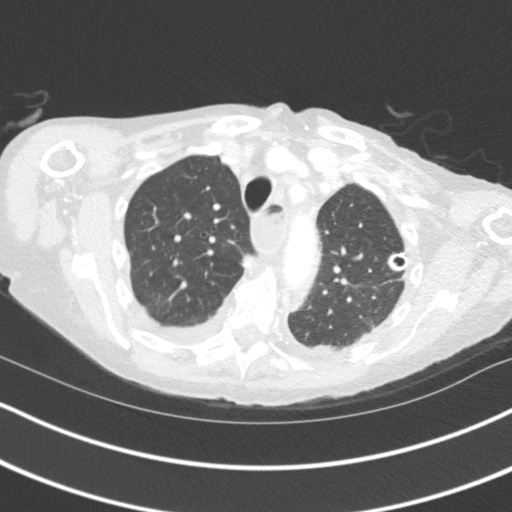
[im 105/124  lung]
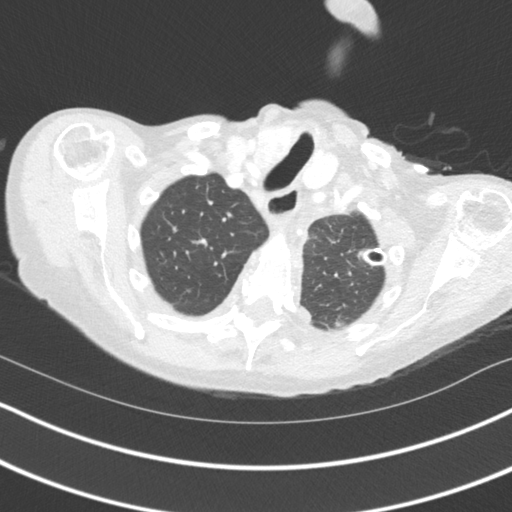
[im 114/124  lung]
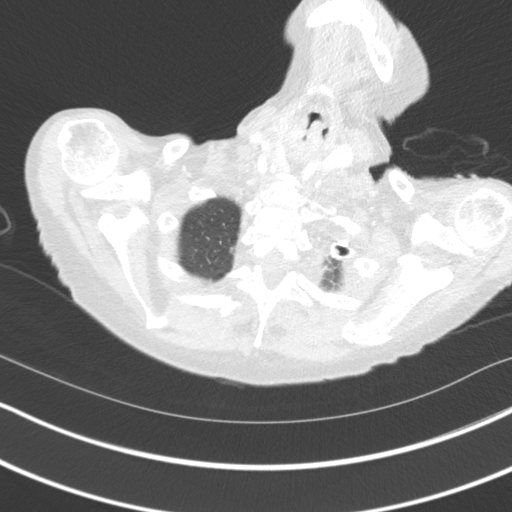

[Series 5: coronal · coronal · 0.58mm/px · 3 of 113 slices shown]
[im 23/113  lung]
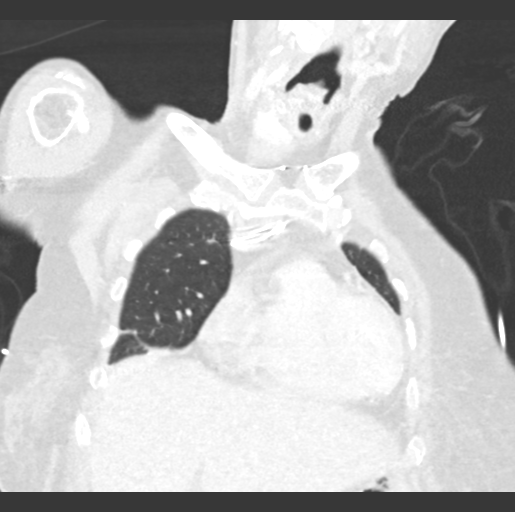
[im 45/113  lung]
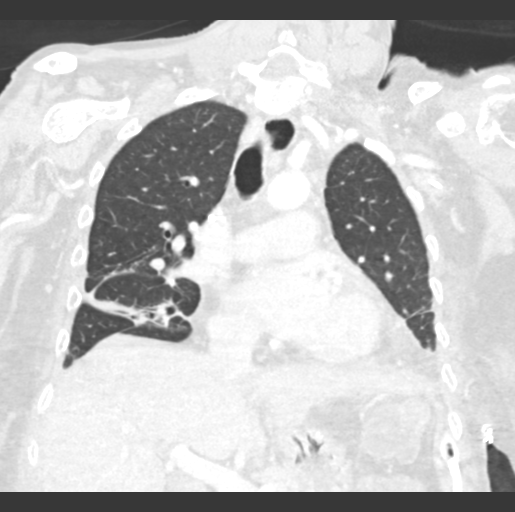
[im 68/113  lung]
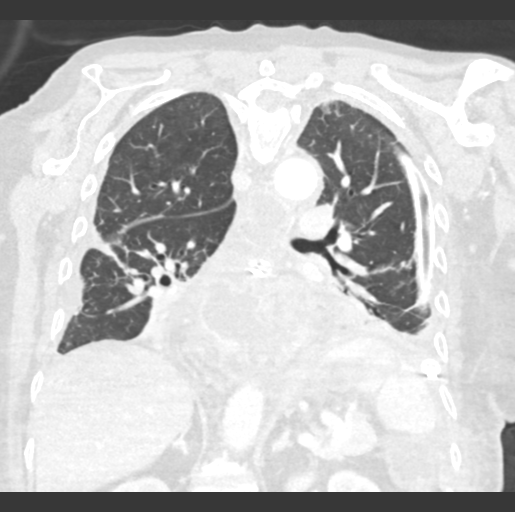

[15 of 36 positions shown; findings below may reference images not displayed]

FINDINGS: Cardiovascular: Normal heart size. No pericardial effusion. Left
subclavian line extends to the upper SVC. The left PICC line tip
extends into the azygos vein. Normal caliber thoracic aorta. Normal
caliber main pulmonary artery.

Mediastinum/Nodes: Patulous and fluid-filled esophagus as well as
large hiatal hernia. The lower esophagus is thick-walled near the
gastroesophageal junction likely representing reactive inflammatory
changes.

Within the posterior inferior mediastinum in the region of the
surgical drain to the left and posterior of the herniated gastric
cardia and anterior to the descending thoracic aorta there is an
ill-defined multiloculated extraluminal fluid collection spanning
approximately in conglomerate 3.8 x 2.7 x 3.9 cm likely representing
the drained abscess(AP x ML x CC series 3, image 72 and series 5,
image 73).

Additionally, to the right and posterior of the herniated gastric
cardia there is a discrete rim enhancing fluid collection measuring
14 mm likely representing a separate abscess loculation (series 3,
image 82).

Multiple stable thyroid nodules are present measuring up to 18 mm in
the right lobe of the thyroid (series 5, image 33).

Lungs/Pleura: Small left and moderate right pleural effusions are
present. There is a left-sided chest tube and second left-sided
drain traversing the left pleural space into the lower posterior
mediastinum. No pneumothorax. Consolidations are present within the
dependent lower lobes bilaterally.

Upper Abdomen: Multiloculated rim enhancing fluid collections are
present under the diaphragm surrounding the spleen and extending
into the left pericolic gutter the representing additional
abscesses.

Musculoskeletal: Multiple chronic bilateral rib fractures are
present. No acute osseous abnormality is identified.
IMPRESSION: 1. Ill-defined multiloculated rim enhancing fluid collection and
extensive edema in posterior mediastinum inferior to mediastinal
drain and anterior to aorta, probably representing the residual of
drained abscess. Discrete 14 mm abscess present in the right
posterior mediastinum.
2. Multiloculated rim enhancing fluid collection surrounding the
spleen and extending into left pericolic gutter, likely abscess.
3. Small bilateral pleural effusions with left pleural drain.
4. Stable dependent lower lobe consolidations may represent
atelectasis or pneumonia.

By: Salomon Owens M.D.

## 2018-11-10 IMAGING — DX DG CHEST 1V PORT
1 series · 1 of 1 positions shown · non-contrast
Comparison: 10/24/2017

CLINICAL DATA: Fever

EXAM:
PORTABLE CHEST 1 VIEW

[chest]
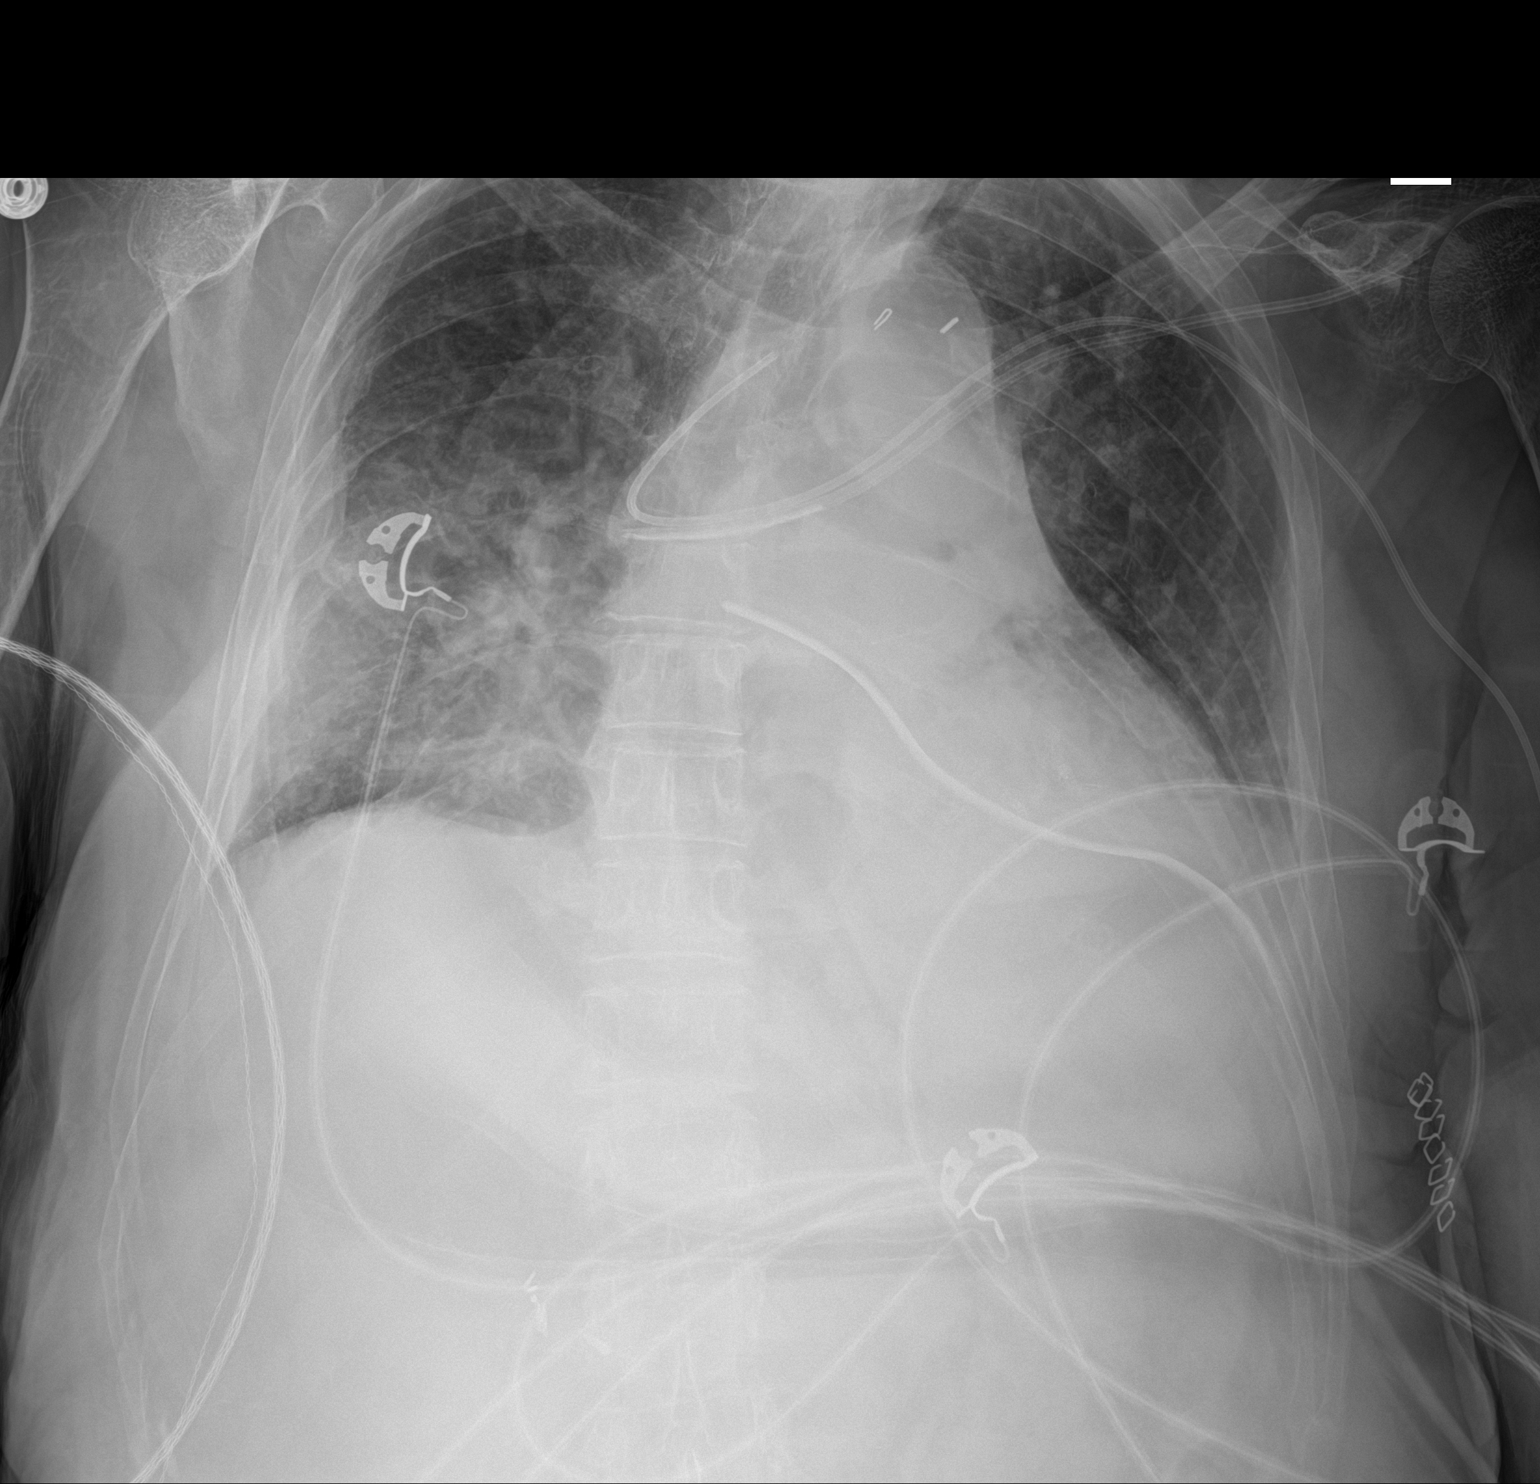

[1 of 1 positions shown; findings below may reference images not displayed]

FINDINGS: Left dialysis catheter and PICC line remain in place, unchanged.
PICC line courses superiorly, unchanged. Interval removal of left
chest tube. No pneumothorax. Mild cardiomegaly. Bilateral perihilar
and lower lobe opacities, similar to prior study.
IMPRESSION: Interval removal of left chest tube without pneumothorax. Otherwise
no significant change.

## 2018-11-11 IMAGING — CT CT ABD-PELV W/ CM
2 of 5 series · 13 of 46 positions shown, 15 images · IV contrast (Omni 300)
Comparison: 10/14/2017

CLINICAL DATA: Abdominal pain, nausea, and fever.  Crohn's disease.

EXAM:
CT ABDOMEN AND PELVIS WITH CONTRAST
TECHNIQUE: Multidetector CT imaging of the abdomen and pelvis was performed
using the standard protocol following bolus administration of
intravenous contrast.
CONTRAST:  100mL [id number] IOPAMIDOL ([id number]) INJECTION 61%

[Series 3: a/p w/ 5mm · axial · 0.80mm/px · z∈[+1046,+1436]mm · 10 of 88 slices shown, 12 images]
[im 5/88  soft-tissue]
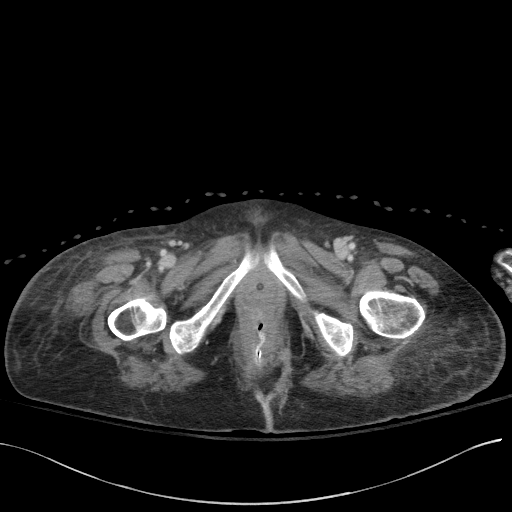
[im 5/88  bone]
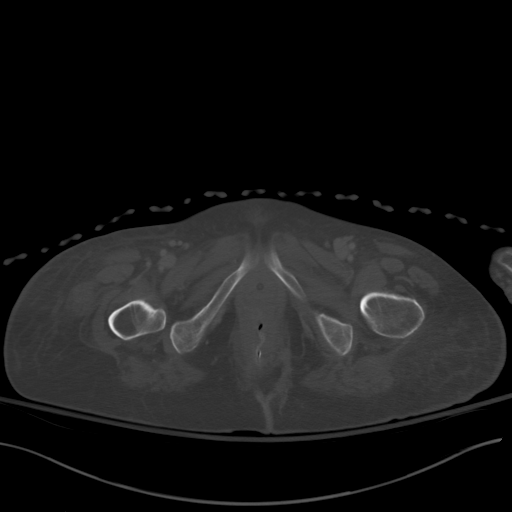
[im 15/88  soft-tissue]
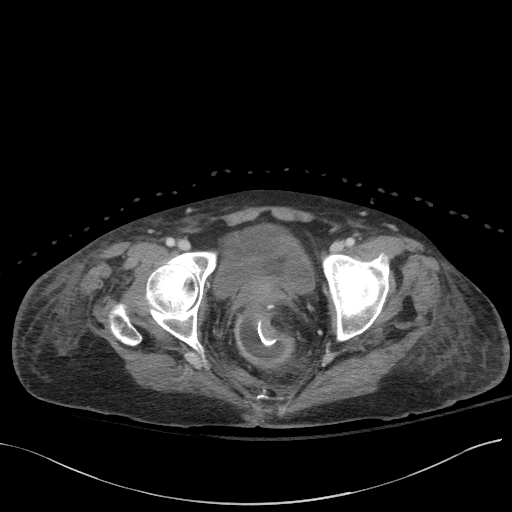
[im 25/88  soft-tissue]
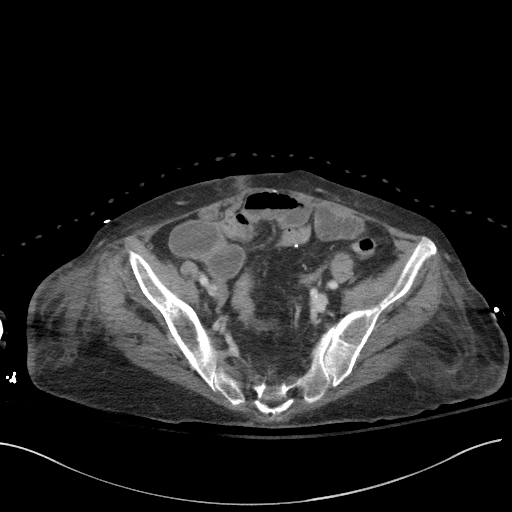
[im 30/88  soft-tissue]
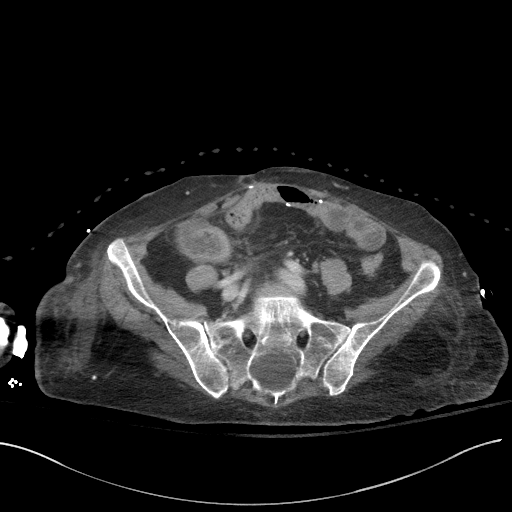
[im 39/88  soft-tissue]
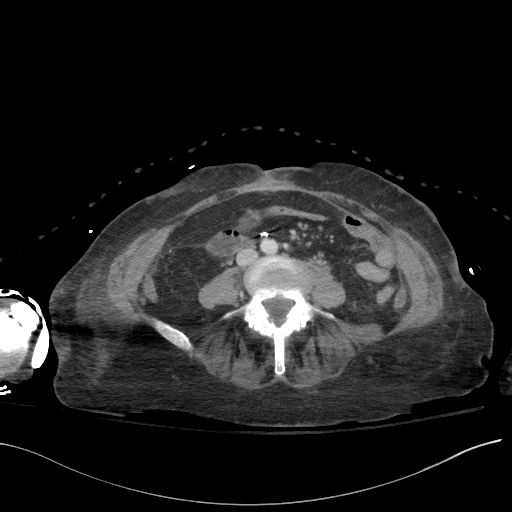
[im 49/88  soft-tissue]
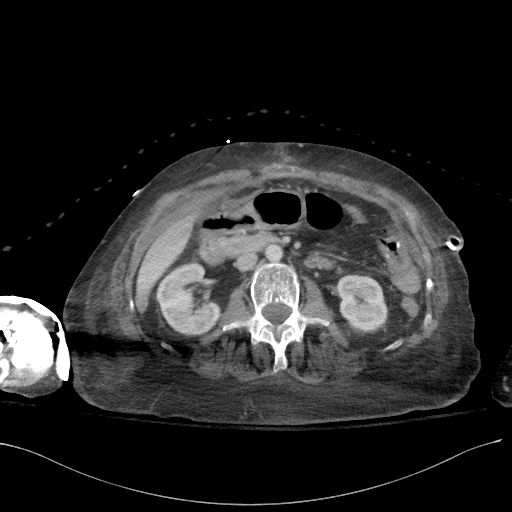
[im 59/88  soft-tissue]
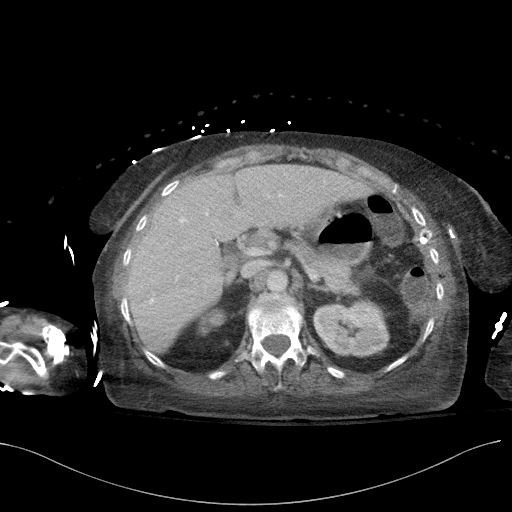
[im 63/88  soft-tissue]
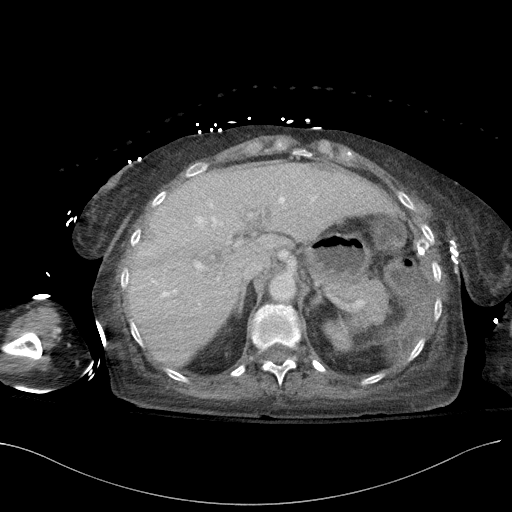
[im 73/88  soft-tissue]
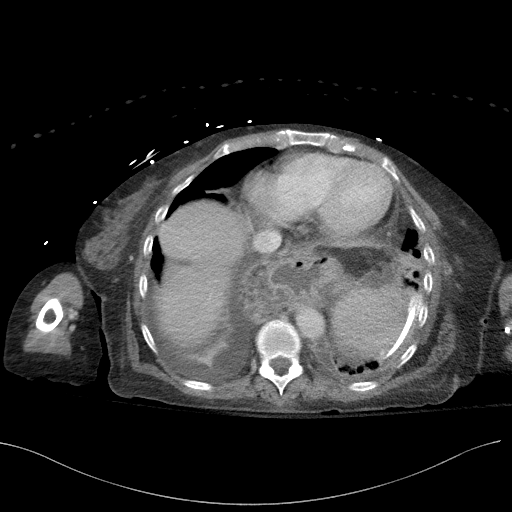
[im 73/88  bone]
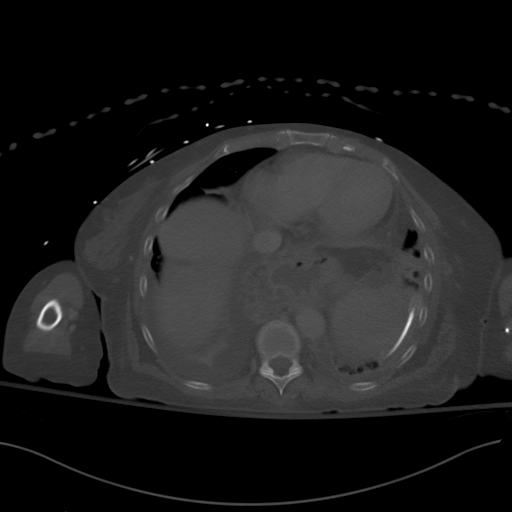
[im 83/88  soft-tissue]
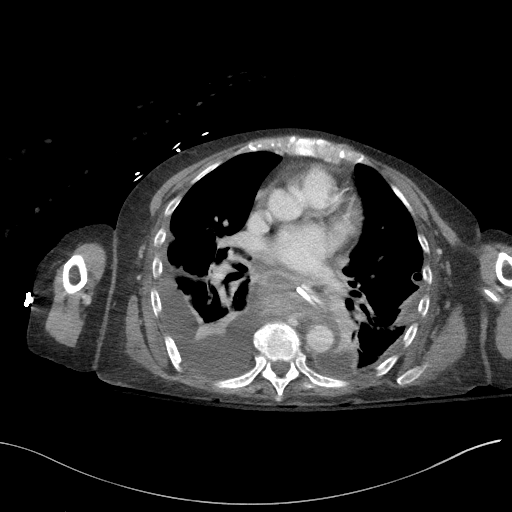

[Series 6: a/p w/ cor · coronal · 0.58mm/px · 3 of 148 slices shown]
[im 50/148  soft-tissue]
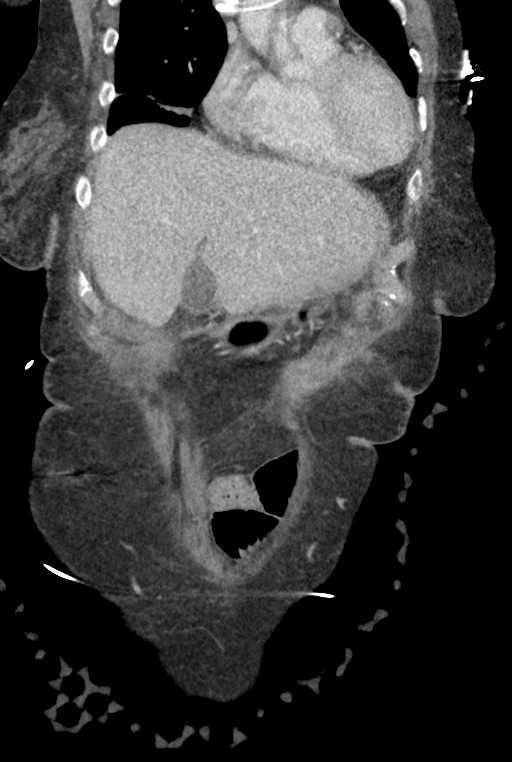
[im 66/148  soft-tissue]
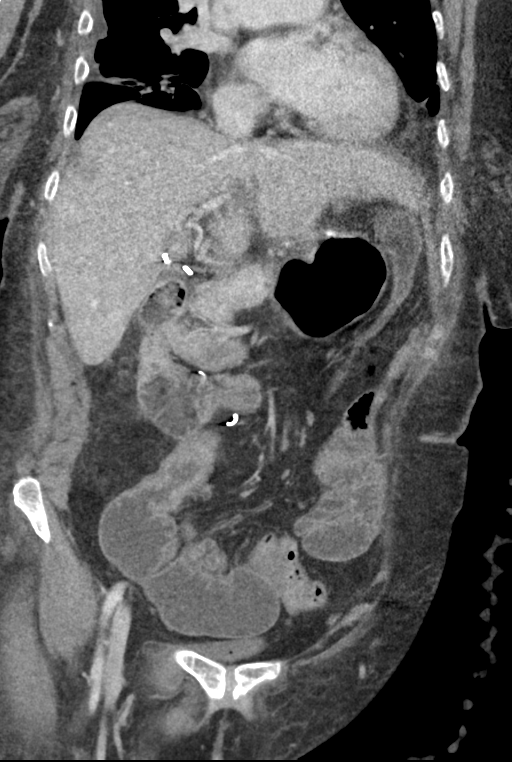
[im 82/148  soft-tissue]
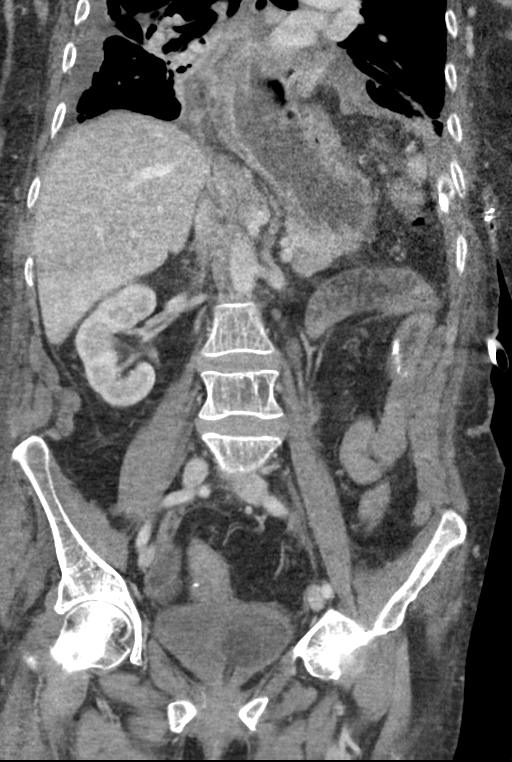

[13 of 46 positions shown; findings below may reference images not displayed]

FINDINGS: Lower Chest: Increased size of moderate right pleural effusion.
Small left pleural effusion is decreased in size. Left chest tube is
now seen, with distal tip seen in the inferior middle mediastinum
adjacent to hiatal hernia. Previously seen inferior mediastinal
fluid collection surrounding the distal esophagus and small hiatal
hernia is nearly completely resolved since previous study.
Persistent inflammatory changes seen in the inferior middle
mediastinum adjacent to the hiatal hernia.

Hepatobiliary: No hepatic masses identified. Prior cholecystectomy.
No evidence of biliary obstruction.

Pancreas:  No mass or inflammatory changes.

Spleen: No evidence of splenomegaly. Small complex fluid collection
seen along the posterosuperior margin the spleen measures 3.6 x
cm on image [DATE], without significant change compared to previous
study.

Adrenals/Urinary Tract: No masses identified. No evidence of
hydronephrosis. Foley catheter seen within urinary bladder which is
decompressed.

Stomach/Bowel: Rectal tube seen in place. Postop changes from
previous distal small bowel and right colon resection seen. Mild
increase in moderate wall thickening is seen involving the distal
ileum extending to the ileocolic anastomosis, with mild adjacent
mesenteric inflammatory changes. Mild dilatation of small bowel
loops proximal to this site noted within the pelvis. This is
consistent with Crohn's disease with stricture and partial distal
small bowel obstruction. No evidence of abscess. Incidentally noted
is left-sided trans sphincteric perianal fistula.

Vascular/Lymphatic: No pathologically enlarged lymph nodes. No
abdominal aortic aneurysm.

Reproductive: Persistent rim enhancing fluid collection in pelvic
cul-de-sac measuring 4.5 x 2.7 cm, without significant change
compared to prior exam. This is suspicious for abscess.

Other:  None.

Musculoskeletal:  No suspicious bone lesions identified.
IMPRESSION: Near complete resolution of inferior mediastinal fluid
collection/abscess surrounding the distal esophagus and small hiatal
hernia and decreased small left pleural effusion, following chest
tube placement.

Increased moderate right pleural effusion and right basilar
atelectasis.

Mild increase in wall thickening and adjacent inflammatory changes
involving the distal ileum, with mild dilatation of several small
bowel loops proximal to this site. This is consistent with Crohn's
disease and stricture formation.

Stable small rim enhancing fluid collections along posterosuperior
margin of the spleen and pelvic cul-de-sac, suspicious for
abscesses.

Left-sided transsphincteric perianal fistula.

CONTRAST EXTRAVASATION CONSULTATION:
Type of contrast:  Isovue 300

Site of extravasation: Upper right forearm

Estimated volume of extravasation: 50 ml

Area of extravasation scanned with CT? no

PATIENT'S SIGNS AND SYMPTOMS

Skin blistering/ulceration: no

Decrease capillary refill: no

Change in skin color: no

Decreased motor function or severe tightness: no

Decreased pulses distal to site of extravasation: no

Altered sensation: no

Increasing pain or signs of increased swelling during observation:
no

TREATMENT

Observation period at [REDACTED]0 minutes

Limb elevation: yes

Ice packs applied: yes

Heat pads applied: No

Plastic surgery consulted? no

DOCUMENTATION AND FOLLOW-UP

Site contrast extravasation forms submitted? yes

Post extravasation orders completed? yes

Was additional follow up assigned to PA's? Yes

Patient's questions answered? yes

Patient instructed to call [phone] or seek immediate medical
care if symptoms progress.

## 2018-11-11 IMAGING — DX DG CHEST 1V PORT
1 series · 1 of 1 positions shown · non-contrast
Comparison: 10/27/2017

CLINICAL DATA: Mediastinal abscess

EXAM:
PORTABLE CHEST 1 VIEW

[chest]
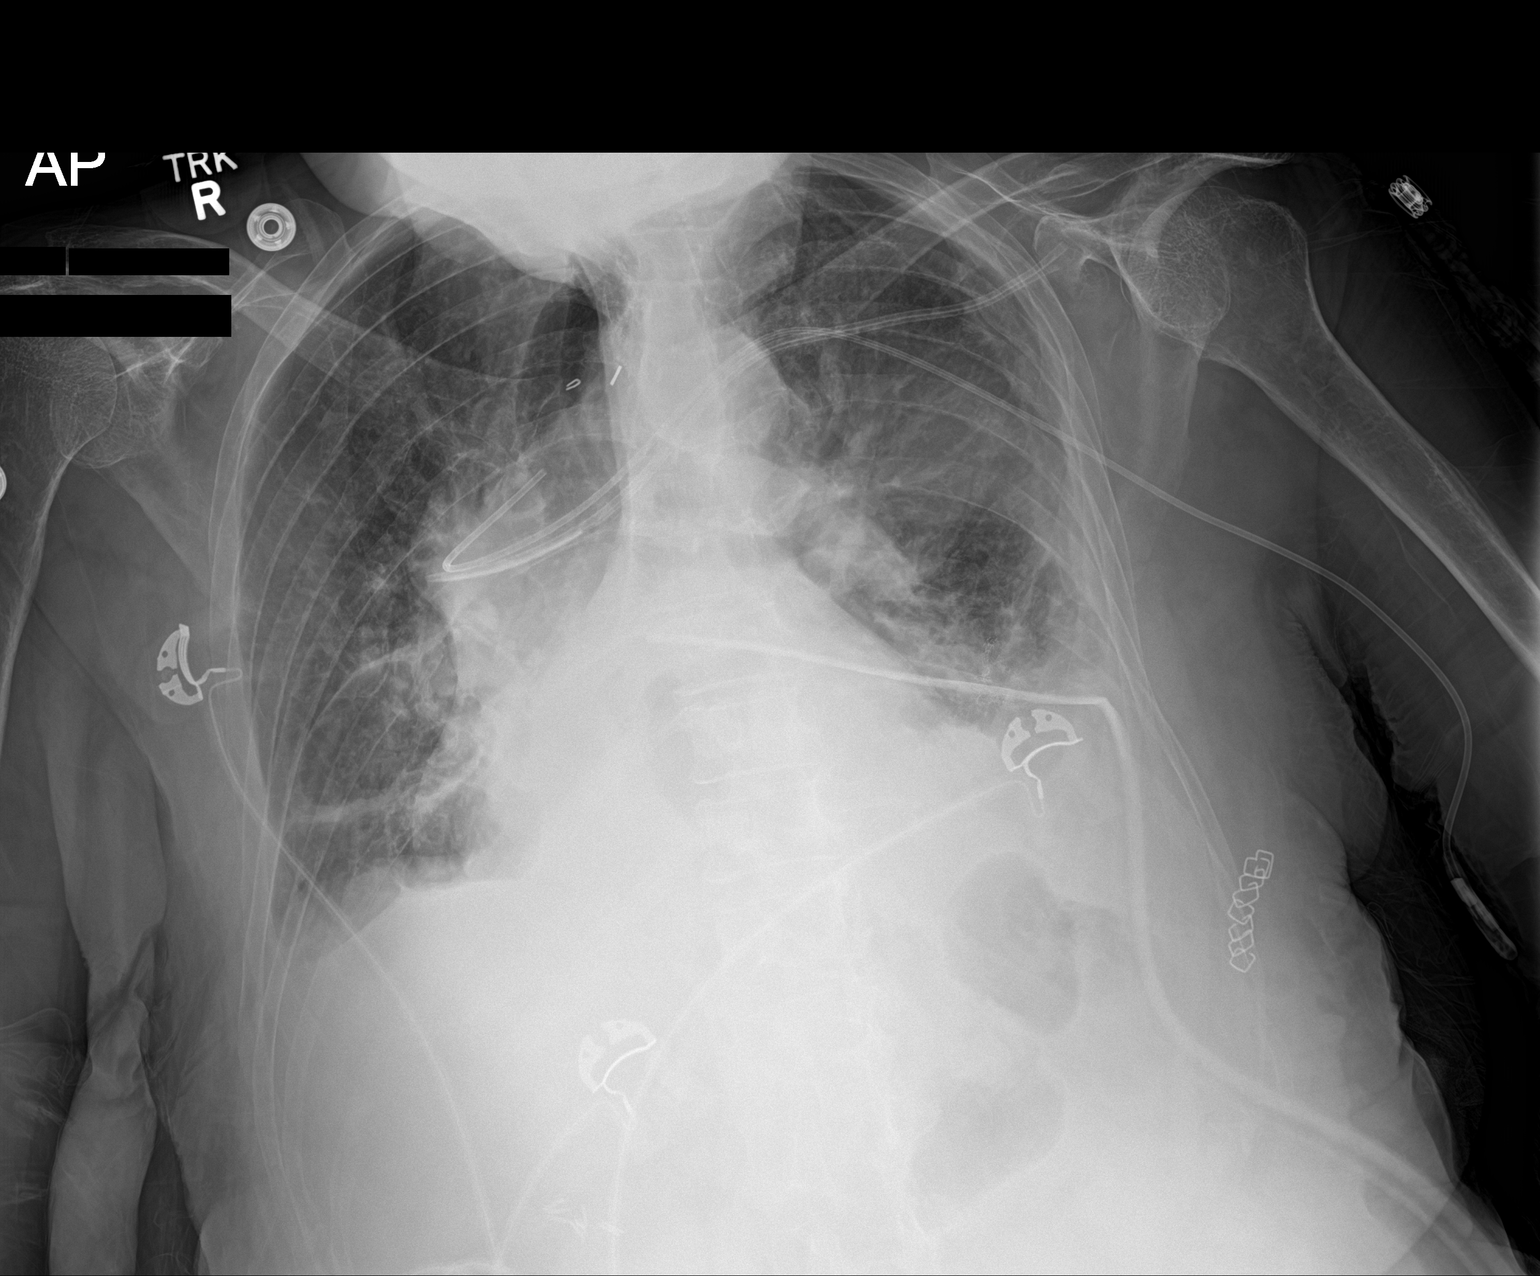

[1 of 1 positions shown; findings below may reference images not displayed]

FINDINGS: Mediastinal drain on the left unchanged from the prior study.
Bibasilar atelectasis unchanged. Small bilateral effusions
unchanged. No pneumothorax

Central line tips unchanged. One line in the SVC other in the azygos
vein.
IMPRESSION: Mediastinal drain unchanged in position. Bibasilar atelectasis/
infiltrate and small effusions unchanged.

## 2018-11-12 IMAGING — CT CT IMAGE GUIDED DRAINAGE BY PERCUTANEOUS CATHETER
1 of 3 series · 14 of 32 positions shown, 19 images · non-contrast
Comparison: none

INDICATION: 70-year-old with a persistent anterior abdominal fluid collection
near the falciform ligament of the liver. This fluid collection has
been present for quite a while but the patient continues to have
fevers. Complex medical history including Crohn's disease and
abdominal abscess collections.
TECHNIQUE: Informed written consent was obtained from the patient after a
thorough discussion of the procedural risks, benefits and
alternatives. All questions were addressed. Maximal Sterile Barrier
Technique was utilized including caps, mask, sterile gowns, sterile
gloves, sterile drape, hand hygiene and skin antiseptic. A timeout
was performed prior to the initiation of the procedure.

[Series 2: i-spiral 5.0 b40f · axial · 0.68mm/px · z∈[+1254,+1454]mm · 14 of 67 slices shown, 19 images]
[im 5/67  soft-tissue]
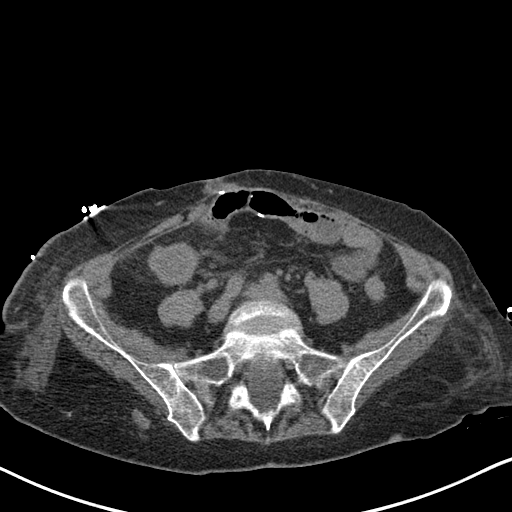
[im 5/67  bone]
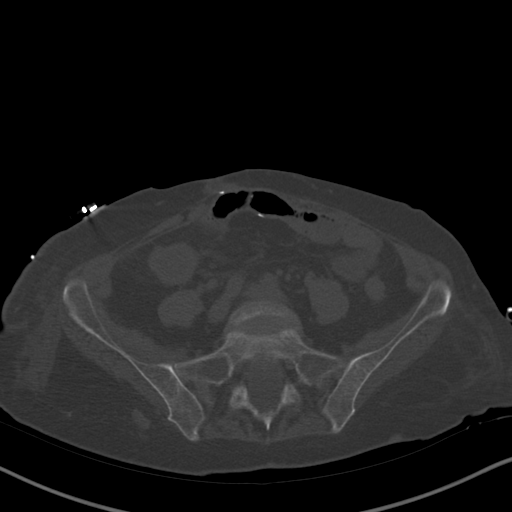
[im 9/67  soft-tissue]
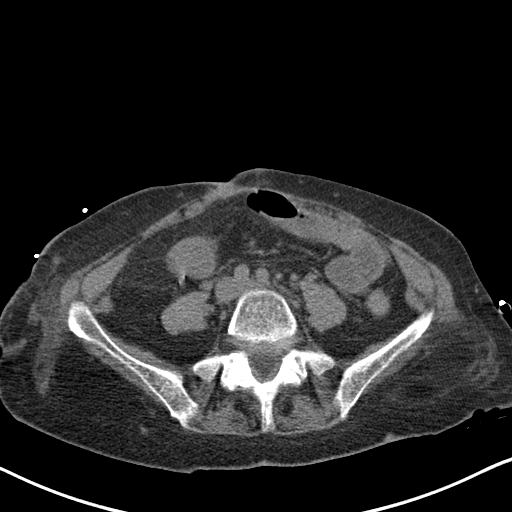
[im 13/67  soft-tissue]
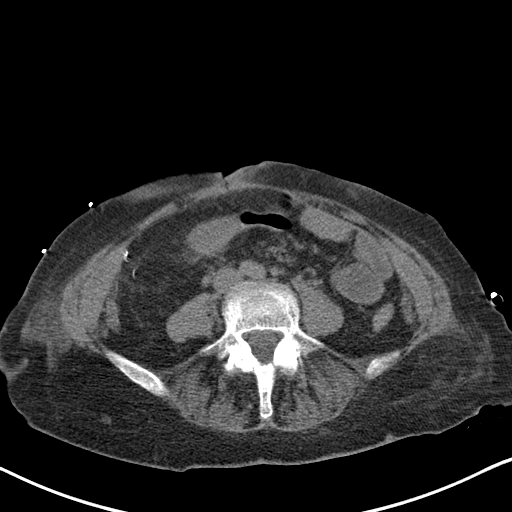
[im 21/67  soft-tissue]
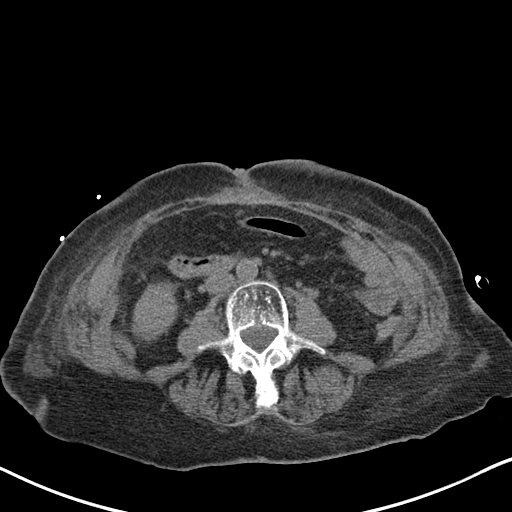
[im 25/67  soft-tissue]
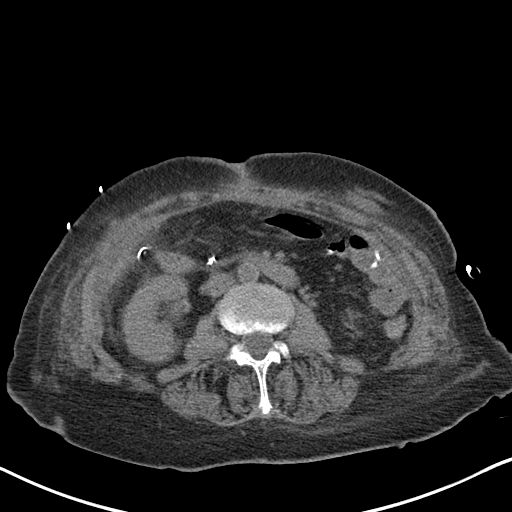
[im 29/67  soft-tissue]
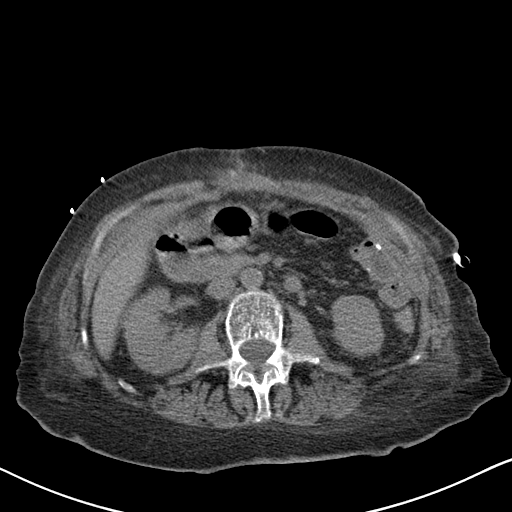
[im 34/67  soft-tissue]
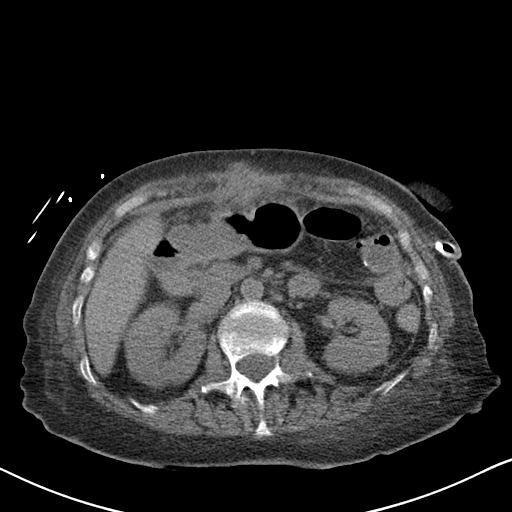
[im 38/67  soft-tissue]
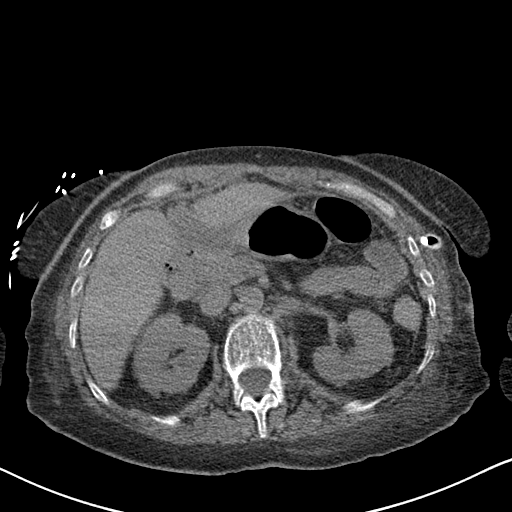
[im 42/67  soft-tissue]
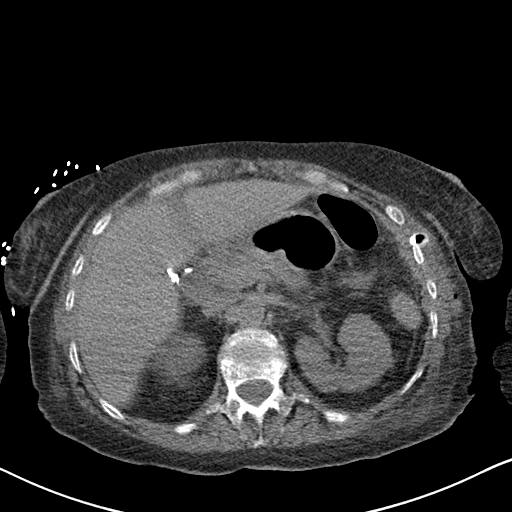
[im 42/67  bone]
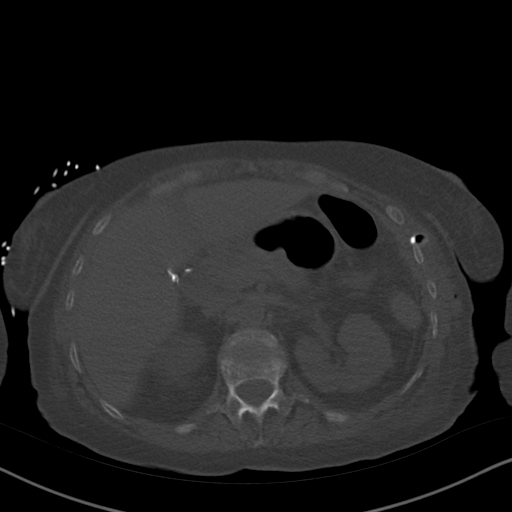
[im 46/67  soft-tissue]
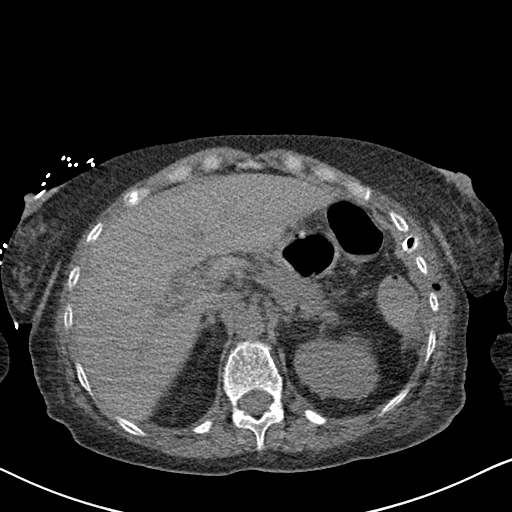
[im 50/67  lung]
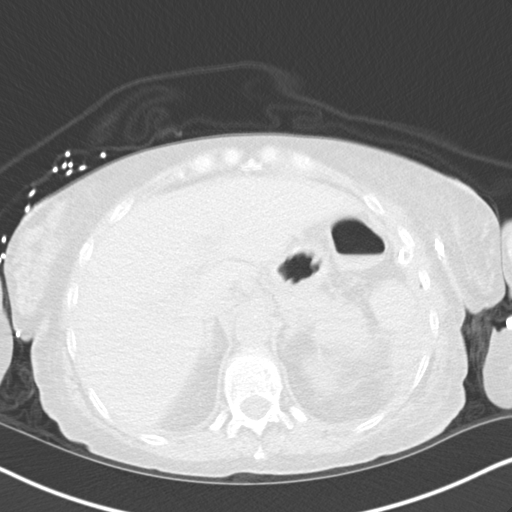
[im 54/67  soft-tissue]
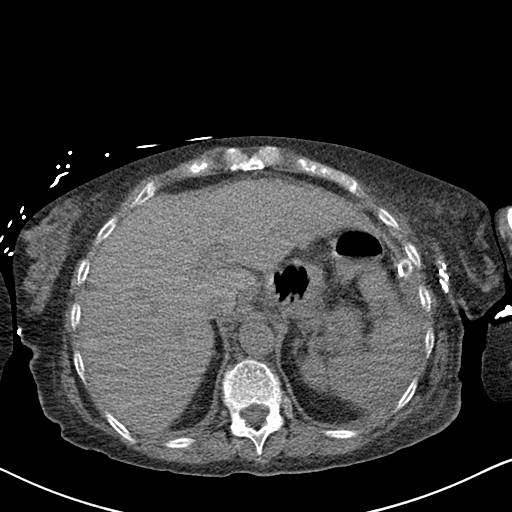
[im 54/67  lung]
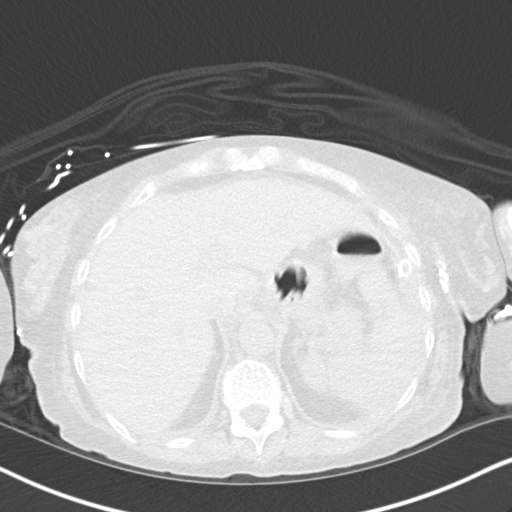
[im 58/67  soft-tissue]
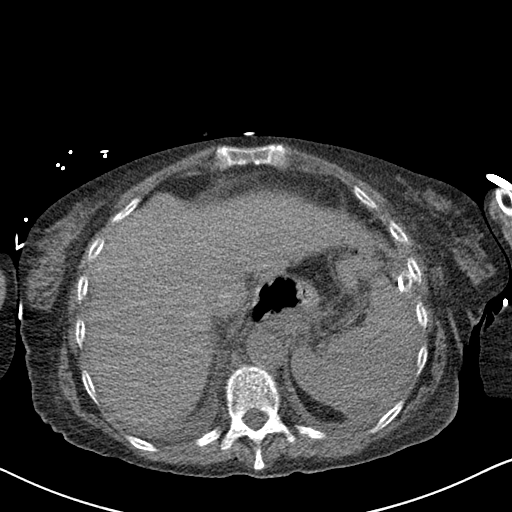
[im 58/67  lung]
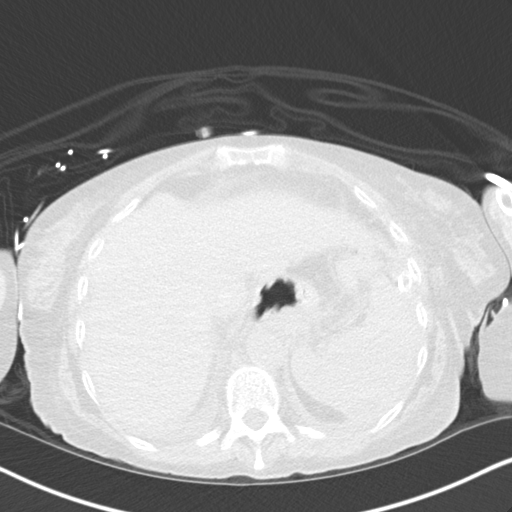
[im 62/67  soft-tissue]
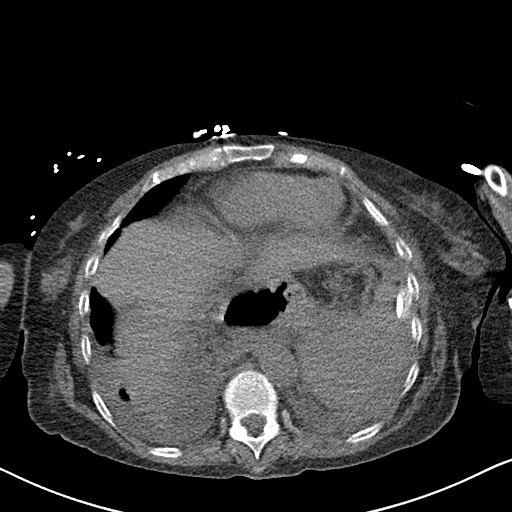
[im 62/67  lung]
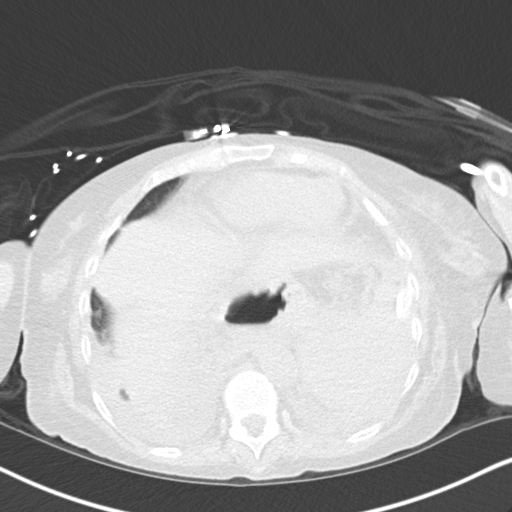

[14 of 32 positions shown; findings below may reference images not displayed]

EXAM:
CT GUIDED DRAINAGE OF ANTERIOR ABDOMINAL ABSCESS

MEDICATIONS:
None

ANESTHESIA/SEDATION:
4 mg IV Versed 20 mcg IV Fentanyl

Moderate Sedation Time:  23 minutes

The patient was continuously monitored during the procedure by the
interventional radiology nurse under my direct supervision.

COMPLICATIONS:
None immediate.
PROCEDURE:
Patient was placed supine in the CT scanner. CT images through the
anterior abdomen were obtained. Anterior abdomen was prepped with
chlorhexidine. Sterile field was created. Skin was anesthetized with
1% lidocaine. Using CT guidance, 18 gauge trocar needle was directed
into the small fluid collection along the inferior left hepatic
lobe. Thick brown fluid was aspirated. A stiff Amplatz wire was
advanced into the collection. The tract was dilated to accommodate a
10.2 French multipurpose drain. Catheter was attached to a suction
bulb. Catheter was sutured to skin.
FINDINGS: Small fluid collection along the inferior aspect of the left hepatic
lobe. Thick brown fluid was aspirated from the collection. 10 French
drain was placed.
IMPRESSION: CT-guided placement of a drainage catheter within the small fluid
collection along the inferior aspect of the left hepatic lobe.

Fluid was sent for culture.

## 2018-11-18 IMAGING — DX DG CHEST 1V PORT
1 series · 1 of 1 positions shown · non-contrast
Comparison: 4456 hours today and earlier.

CLINICAL DATA: 70-year-old female new intubation. OG tube partially
advanced at the time of this image.

EXAM:
PORTABLE CHEST 1 VIEW

[chest ap]
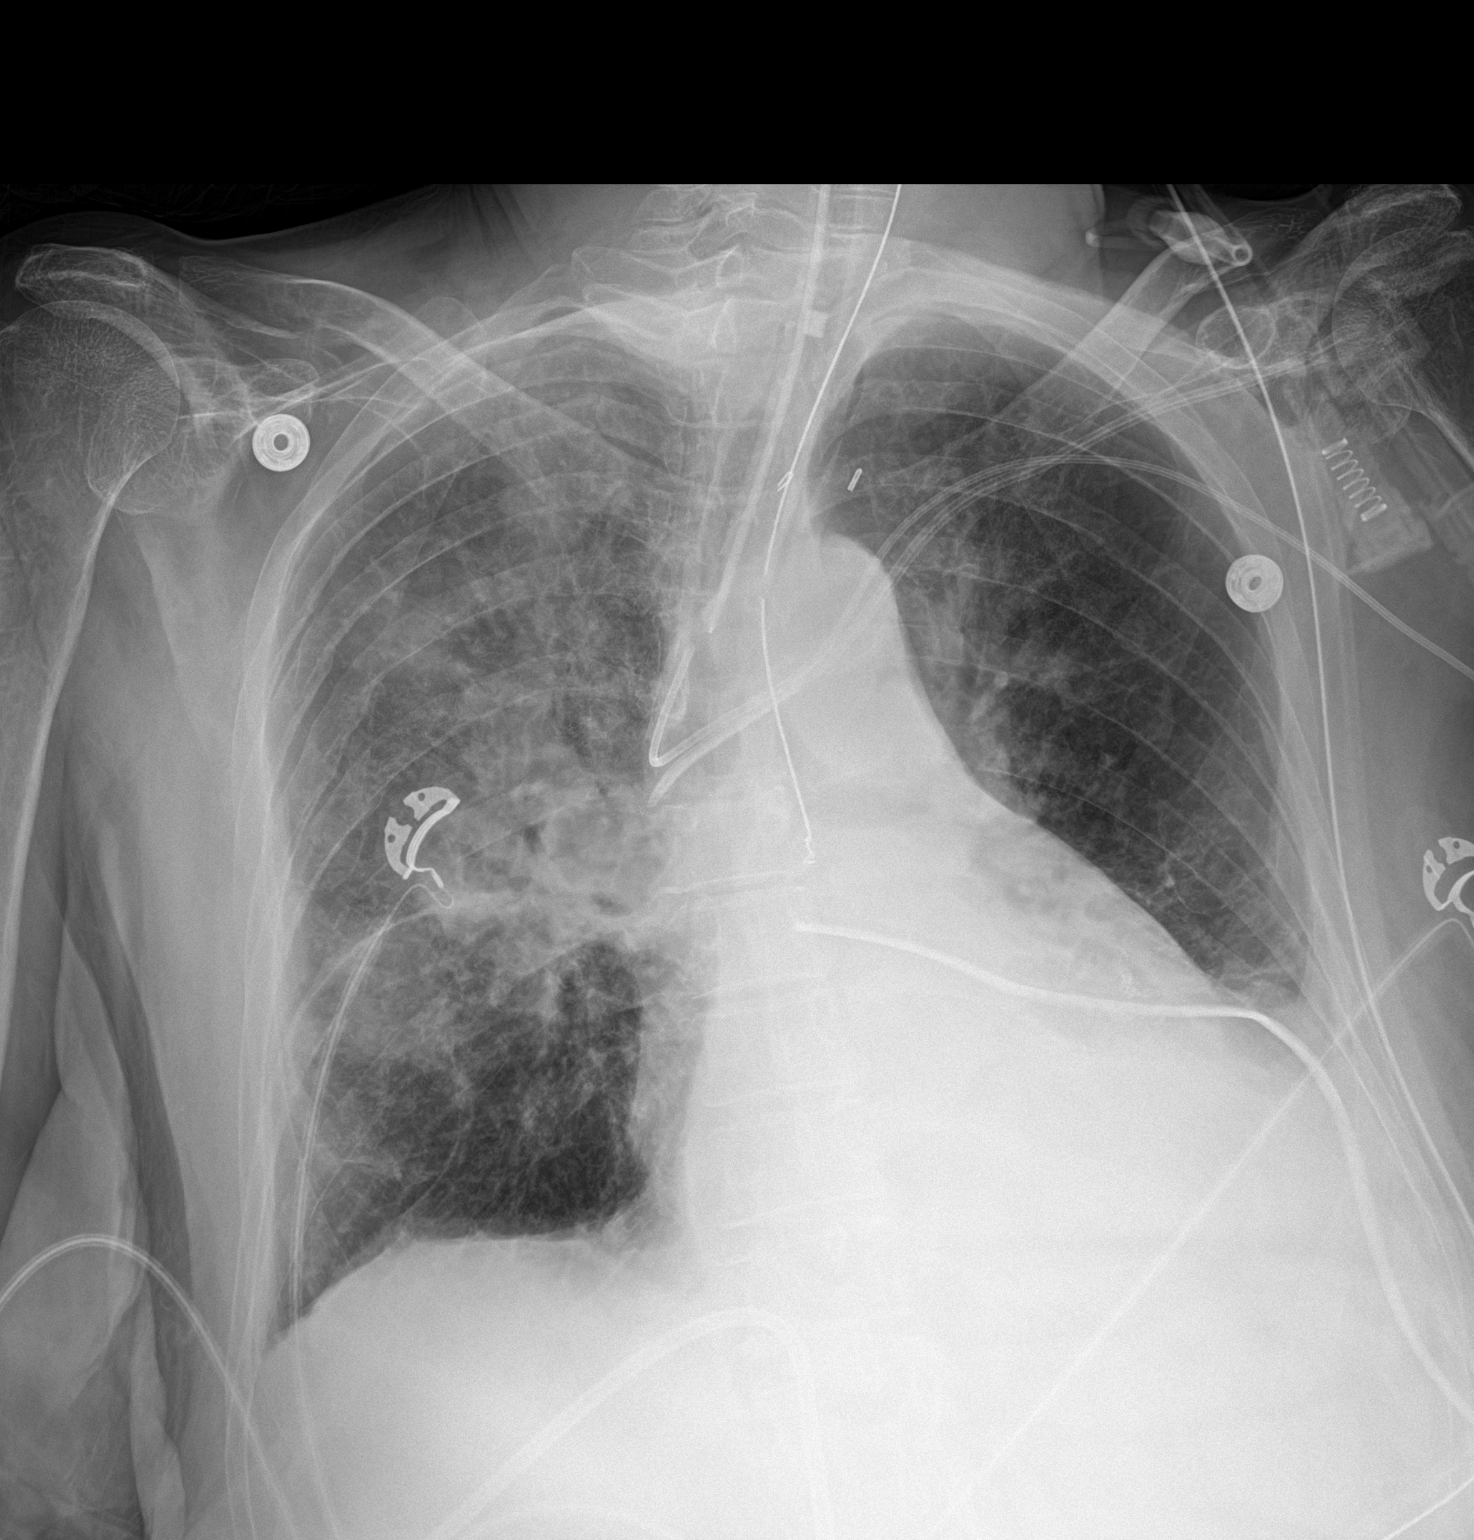

[1 of 1 positions shown; findings below may reference images not displayed]

FINDINGS: Portable AP semi upright view at 2242 hours. Endotracheal tube tip
in good position between the level the clavicles and carina. Stable
left subclavian region approach and superimposed left upper
extremity vascular catheters. An enteric tube has been placed, the
tip projects about 1 vertebral body below the carina at this time.
Below that a mediastinal drain is stable.

Larger lung volumes. Stable cardiac size and mediastinal contours.
Dense retrocardiac and left lung base opacity persists. No
pneumothorax. Patchy and confluent right perihilar opacity persists.
Paucity of bowel gas in the upper abdomen.
IMPRESSION: 1. Endotracheal tube tip in good position.
2. Enteric tube tip at the midthoracic esophagus level. The clinical
team knows to advance this tube and is going to obtain a follow-up
image.
3.  Otherwise stable lines and tubes.
4. Stable ventilation. Confluent right perihilar opacity persists,
suspicious for aspiration versus pneumonia.

## 2018-11-18 IMAGING — CT CT CHEST W/ CM
2 of 5 series · 12 of 36 positions shown, 15 images · IV contrast (APPLIED)
Comparison: 10/29/2017, 10/28/2017, 10/25/2017, 10/14/2017,
10/01/2017, 09/24/2017, multiple prior studies dating back to
07/27/2017

CLINICAL DATA: Complicated pneumonia, history of abscess

EXAM:
CT CHEST, ABDOMEN, AND PELVIS WITH CONTRAST
TECHNIQUE: Multidetector CT imaging of the chest, abdomen and pelvis was
performed following the standard protocol during bolus
administration of intravenous contrast.
CONTRAST:  100mL [id number] IOPAMIDOL ([id number]) INJECTION 61%

[Series 3: cap 5.0 i31f 2 · axial · 0.73mm/px · z∈[-461,-6]mm · 9 of 115 slices shown, 12 images]
[im 12/115  mediastinal]
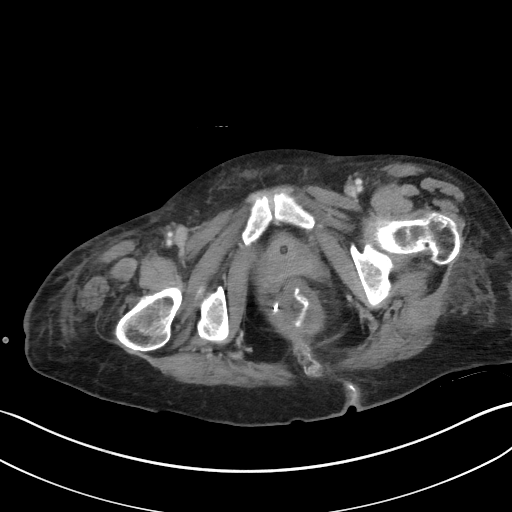
[im 12/115  lung]
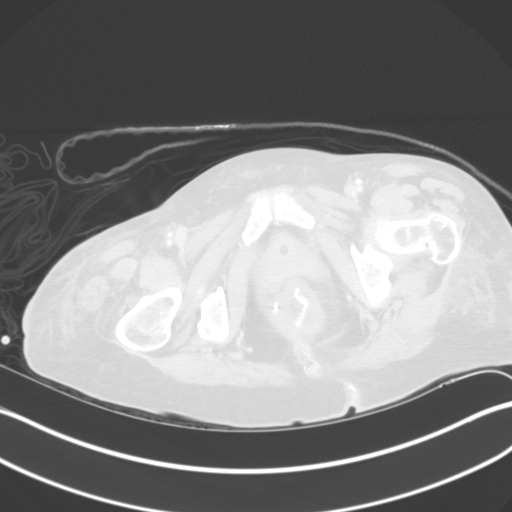
[im 23/115  lung]
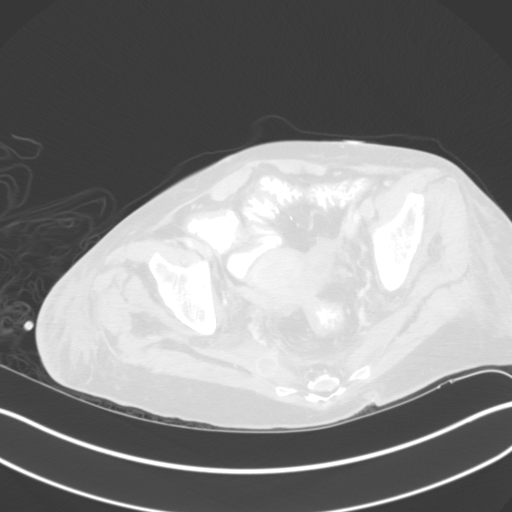
[im 35/115  lung]
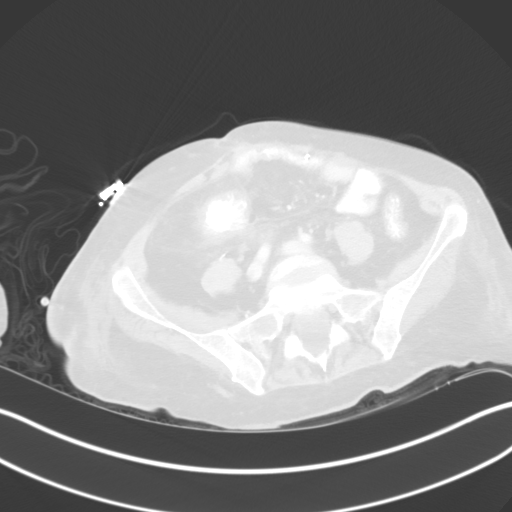
[im 46/115  lung]
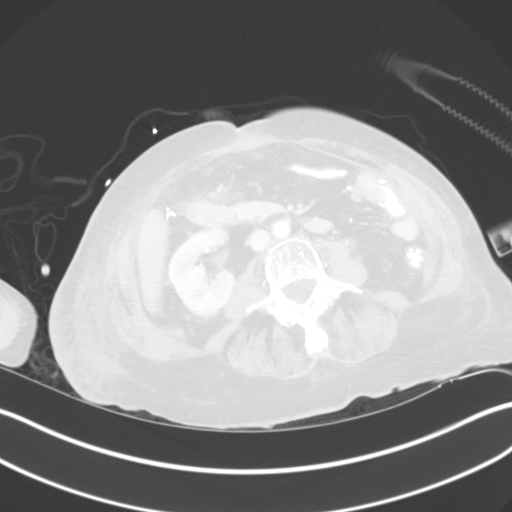
[im 58/115  mediastinal]
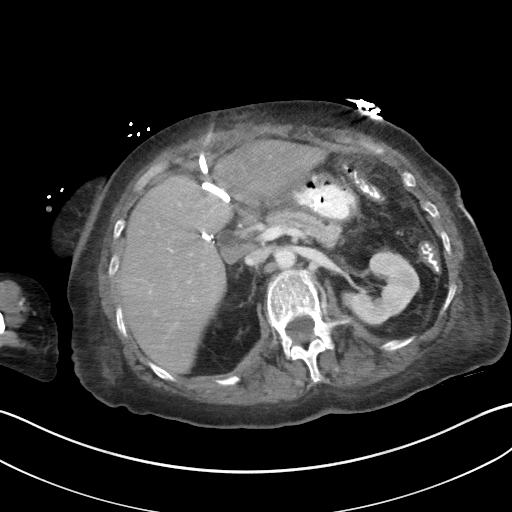
[im 58/115  lung]
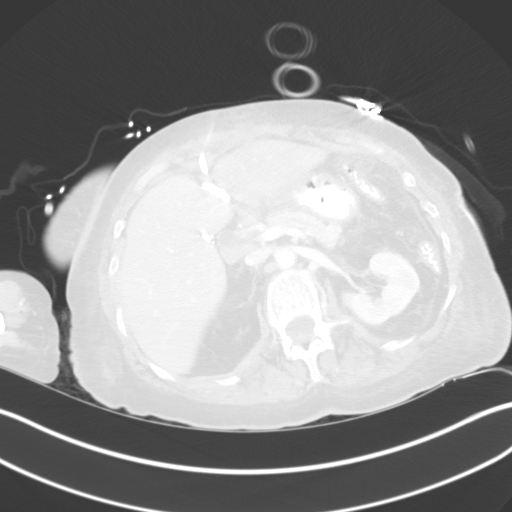
[im 69/115  lung]
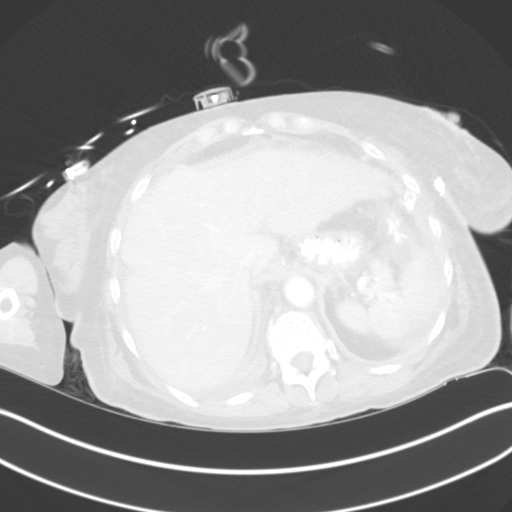
[im 80/115  lung]
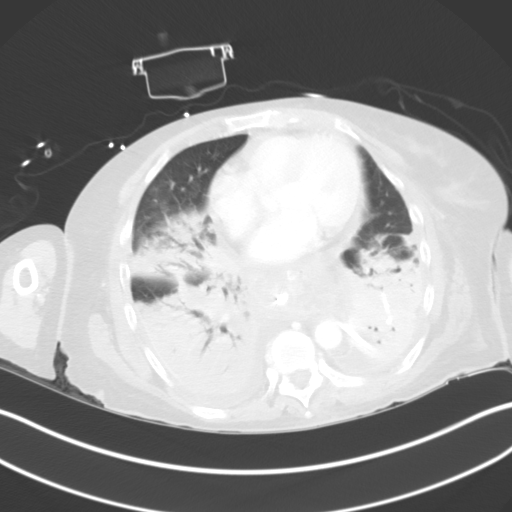
[im 92/115  lung]
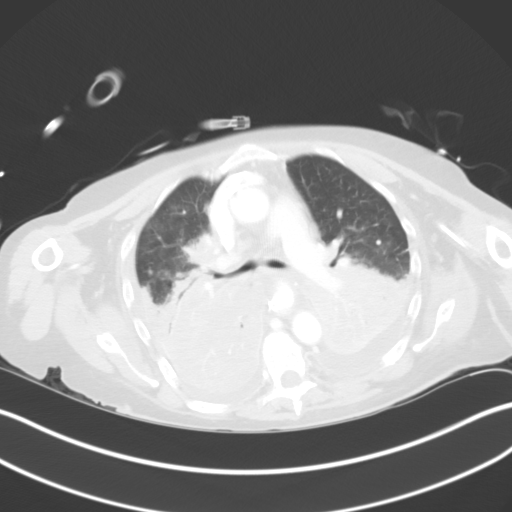
[im 103/115  mediastinal]
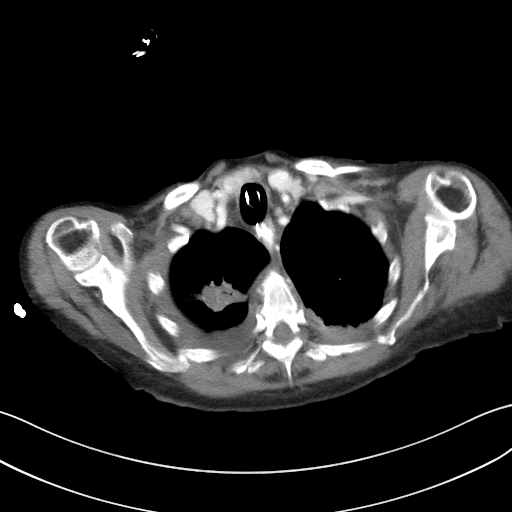
[im 103/115  lung]
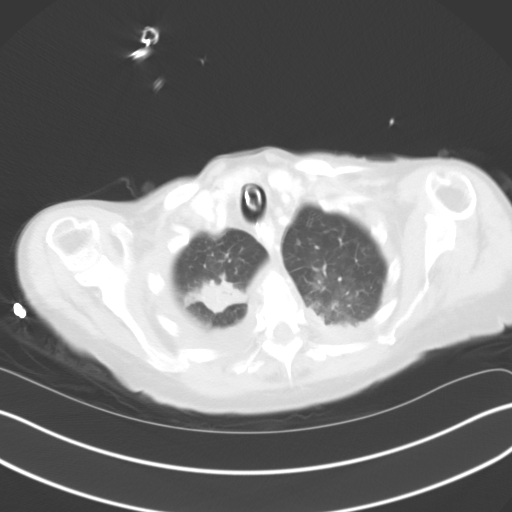

[Series 6: coronal · coronal · 0.65mm/px · 3 of 120 slices shown]
[im 24/120  lung]
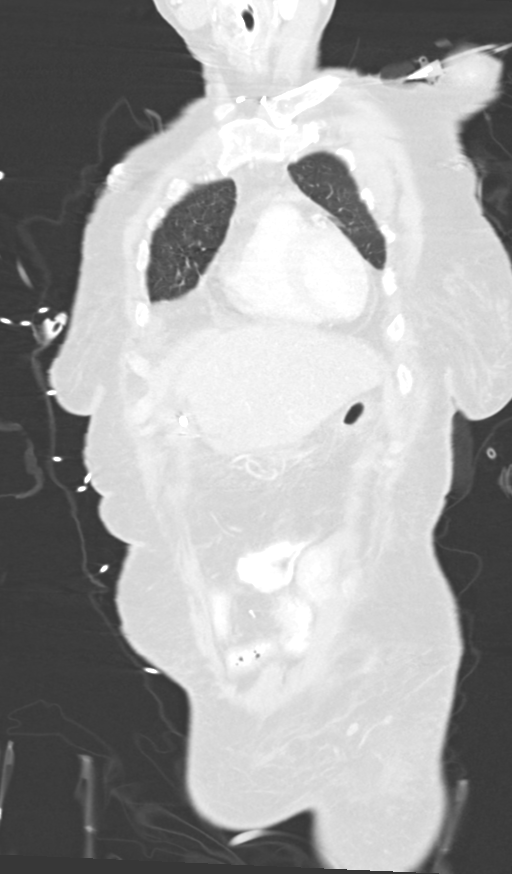
[im 48/120  lung]
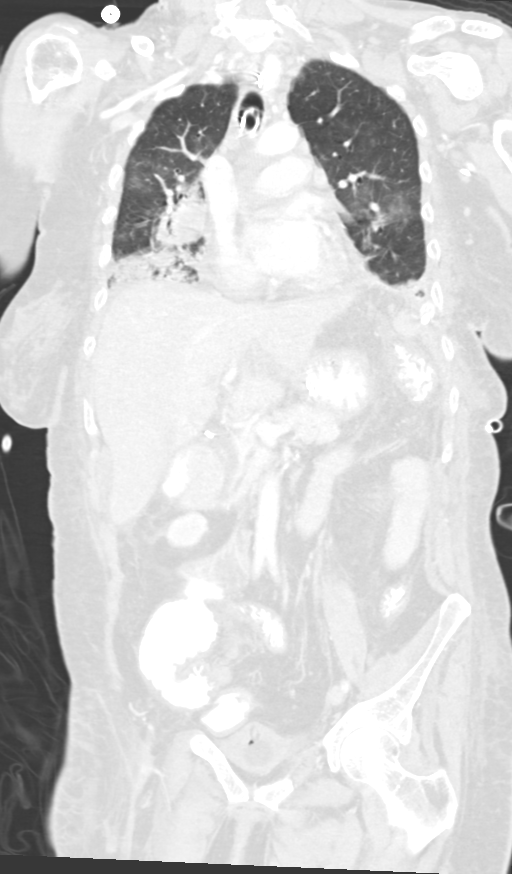
[im 72/120  lung]
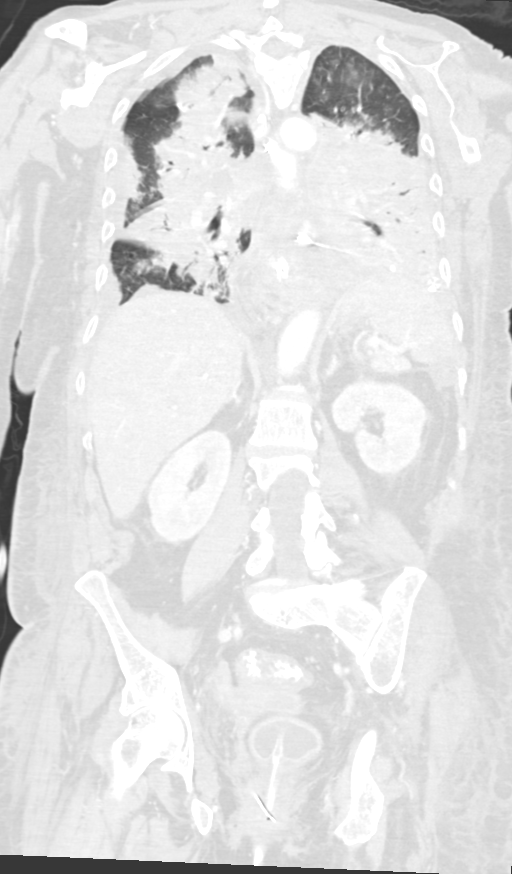

[12 of 36 positions shown; findings below may reference images not displayed]

FINDINGS: CT CHEST FINDINGS

Cardiovascular: Left-sided central venous catheters are present. The
left subclavian central venous catheter tip appears to be positioned
at the SVC origin. The left arm PICC catheter tip appears to be
positioned within the azygos vein, series 3, image number 22. Mild
atherosclerotic calcification. No aneurysmal dilatation. Heart size
is within normal limits. No large pericardial effusion.

Mediastinum/Nodes: Re- demonstrated heterogenous thyroid with
multiple hypodense nodules in the right lobe. Endotracheal tube tip
terminates approximately 1.5 cm above the carina. Esophageal tube
tip terminates in the mid stomach. Heterogenous thickening of hiatal
hernia without re- accumulation of fluid collection. A left-sided
chest tube tip is again positions along the inferior left
mediastinum near the hernia. Mild mediastinal lymph nodes, measuring
up to 9 mm at the AP window.

Lungs/Pleura: Small slightly loculated bilateral pleural effusions.
Significant worsening of extensive dense consolidation involving the
upper lobes, right middle lobe and bilateral lower lobes. A linear
density is re- demonstrated at the left lung base and may relate to
operative change. No pneumothorax.

Musculoskeletal: No acute or suspicious bone lesion. Probable false
appearance of sternal fracture due to motion artifact.

CT ABDOMEN PELVIS FINDINGS

Hepatobiliary: Surgical clips at the gallbladder fossa. New
percutaneous drain at the falciform ligament with decreased
loculated fluid collection. No focal hepatic abnormality

Pancreas: Unremarkable. No pancreatic ductal dilatation or
surrounding inflammatory changes.

Spleen: Normal in size without focal abnormality. Re- demonstrated
complex slightly rim enhancing fluid collection posterior to the
spleen measuring 4 x 11 mm, compared with 3.6 x 14 mm previously.

Adrenals/Urinary Tract: Adrenal glands are within normal limits.
Kidneys show no hydronephrosis. The bladder has a Foley catheter

Stomach/Bowel: Esophageal tube in the stomach. Thickened appearance
of distal small bowel with evidence of prior bowel resection. Mild
wall thickening diffusely of the residual colon. Rectal tube is in
place. Re- demonstrated left perianal fistula.

Vascular/Lymphatic: Nonaneurysmal aorta.  No significant adenopathy

Reproductive: Uterus and bilateral adnexa are unremarkable.

Other: Negative for free air. Rim enhancing pelvic fluid collection
measures 11 x 56 mm and is grossly stable allowing for differences
in measurement technique. It is contiguous with a mild rim enhancing
linear fluid tract with a 14 mm small fluid collection in the right
gluteal region, likely related to the prior drainage catheter
placement.

Musculoskeletal: Stable mild compression L1. Re- demonstrated sacral
Tarlov cysts.
IMPRESSION: 1. Small bilateral pleural effusions with extensive dense
consolidations involving the bilateral lungs, significantly worsened
compared to prior chest CT.
2. Left PICC tip is positioned within the azygos vein
3. Heterogenous mediastinal density adjacent to the hiatal hernia
but without reaccumulation of fluid. Stable positioning of a chest
tube adjacent to the left aspect of the hernia.
4. Interval placement of percutaneous drainage catheter along the
inferior aspect of the left hepatic lobe with resolution of fluid
collection.
5. Stable rim enhancing fluid collection adjacent to the spleen and
within the pelvis
6. Post- operative changes of the small and large bowel. Wall
thickening of residual colon presumably related to history of
inflammatory bowel disease.

## 2018-12-04 IMAGING — US US RENAL
1 series · 14 of 18 positions shown · non-contrast
Comparison: None.

CLINICAL DATA: Urinary retention

EXAM:
LIMITED RENAL ULTRASOUND

[Series 1: us renal · 0.23mm/px · 14 of 18 slices shown]
[im 1/18]
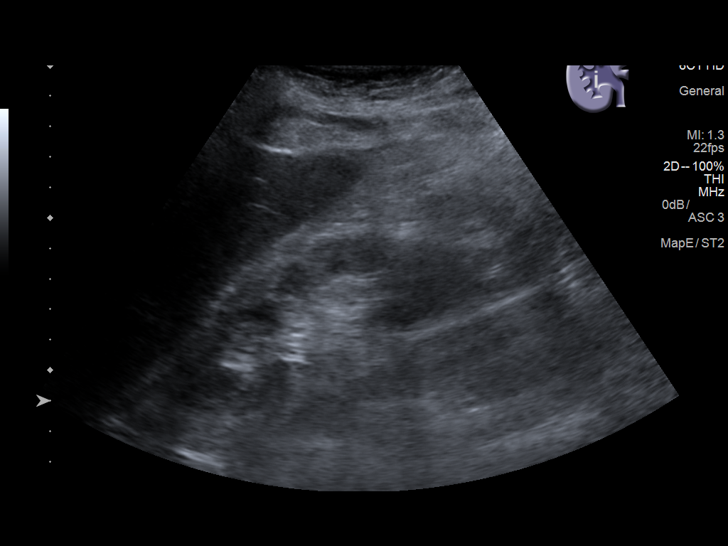
[im 2/18]
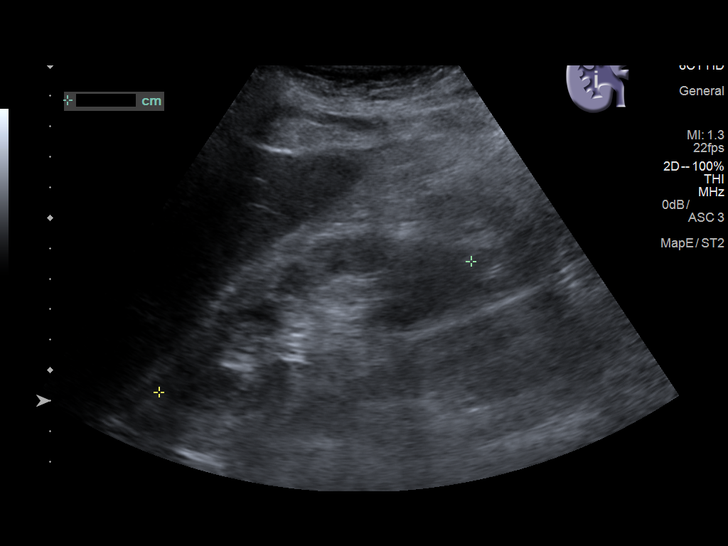
[im 4/18]
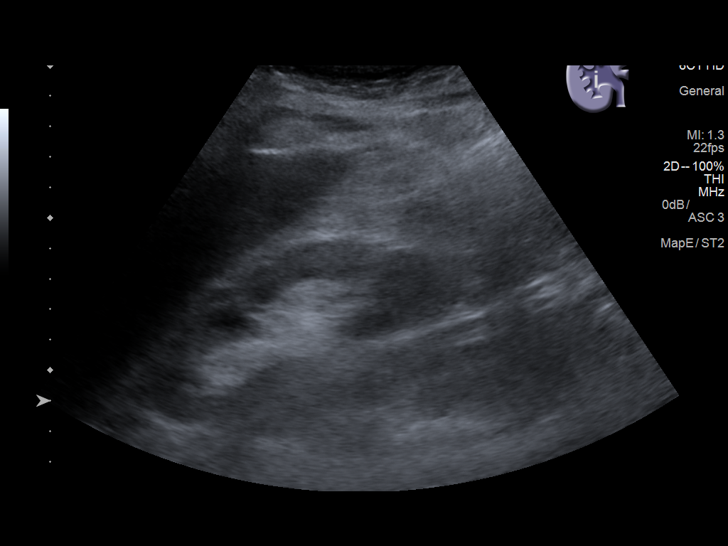
[im 5/18]
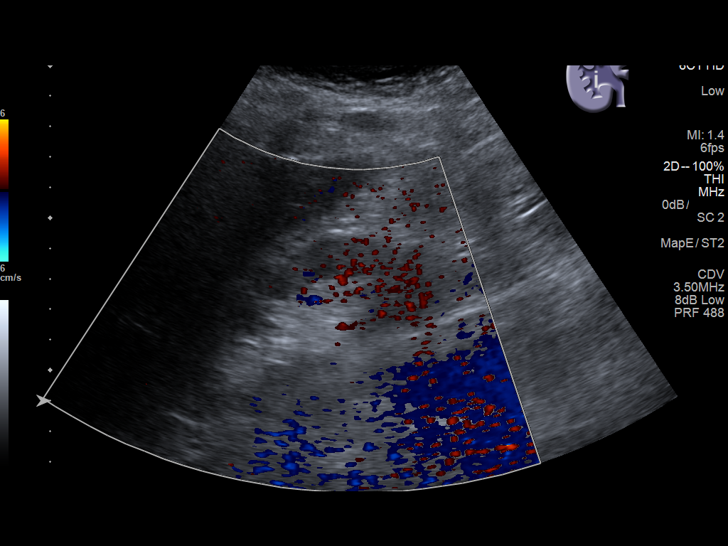
[im 6/18]
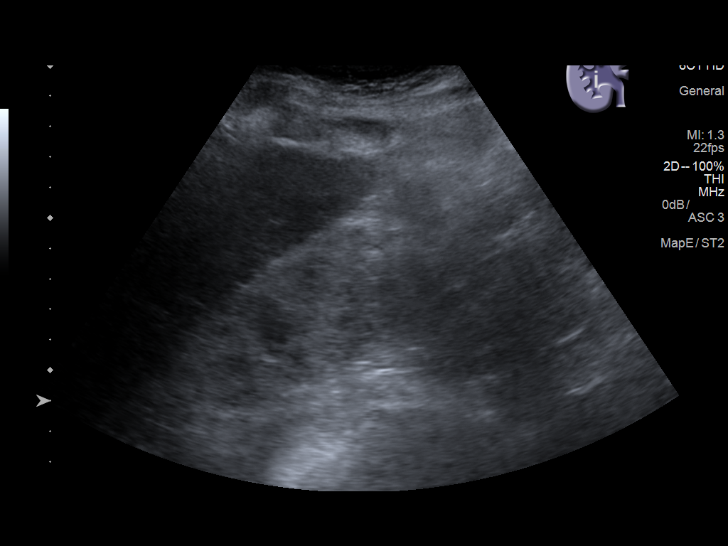
[im 8/18]
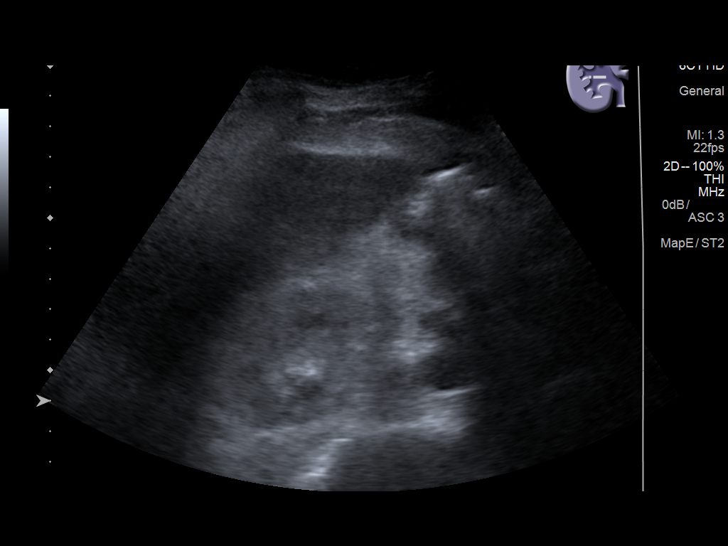
[im 9/18]
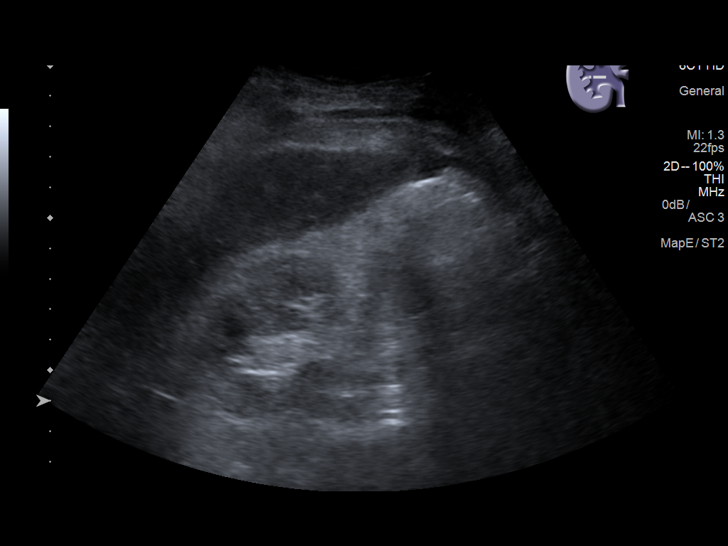
[im 10/18]
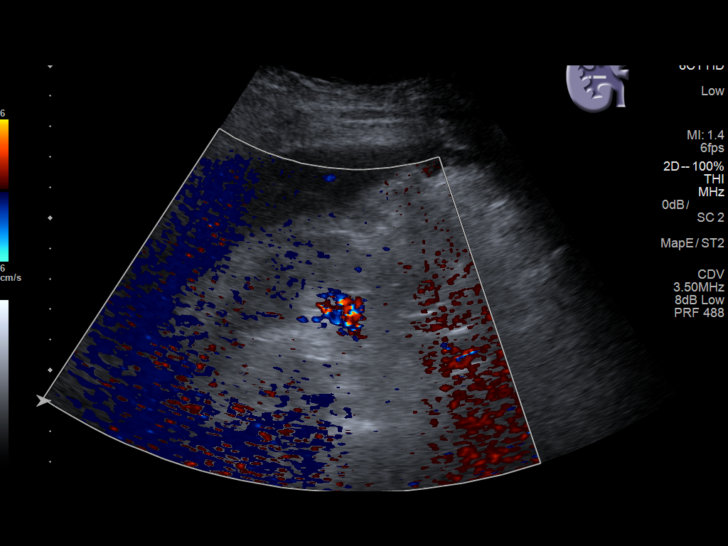
[im 11/18]
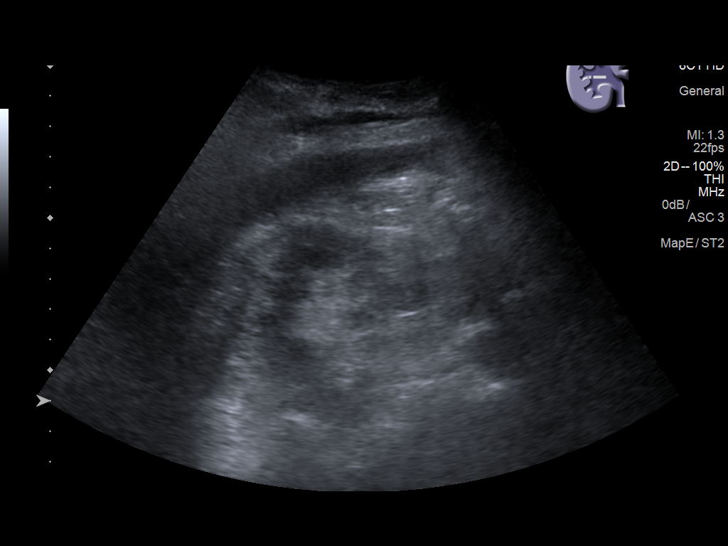
[im 13/18]
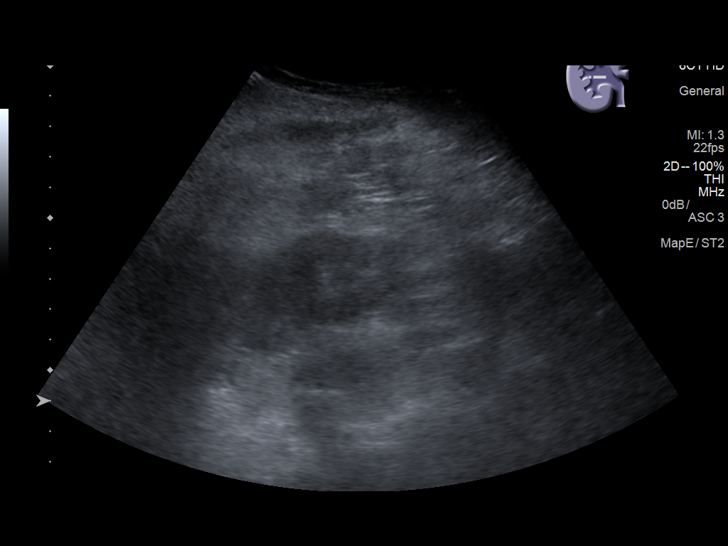
[im 14/18]
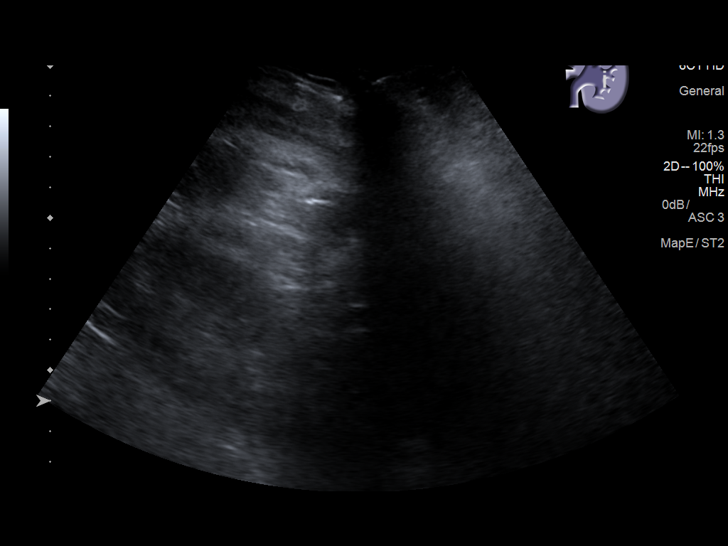
[im 15/18]
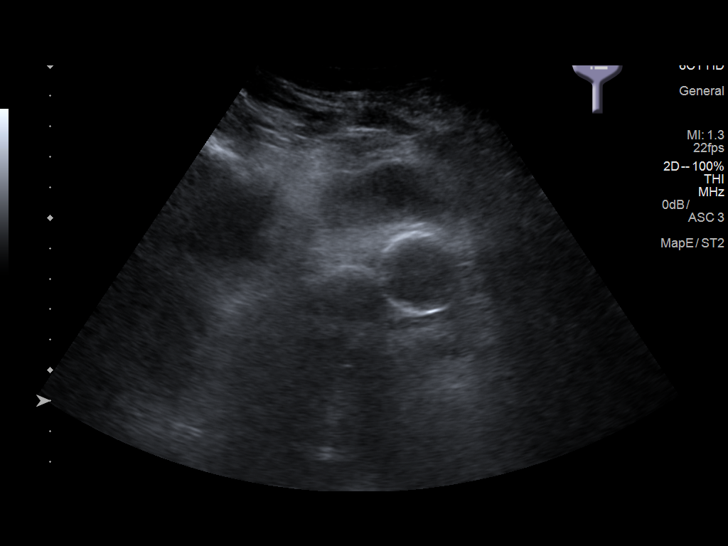
[im 17/18]
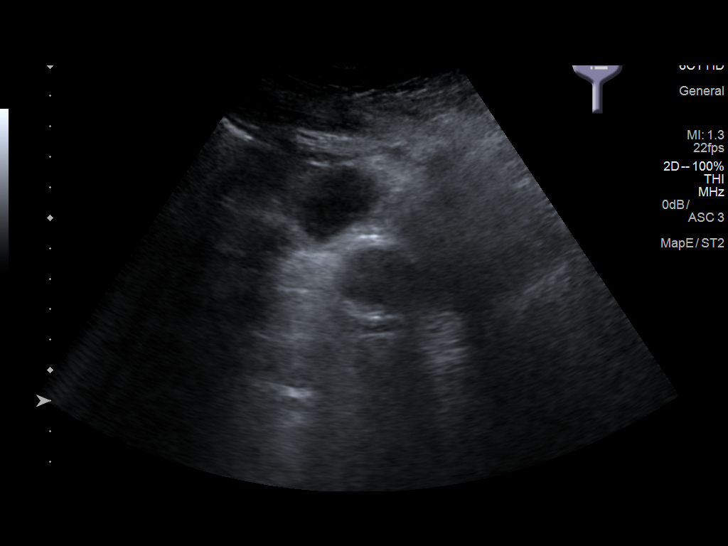
[im 18/18]
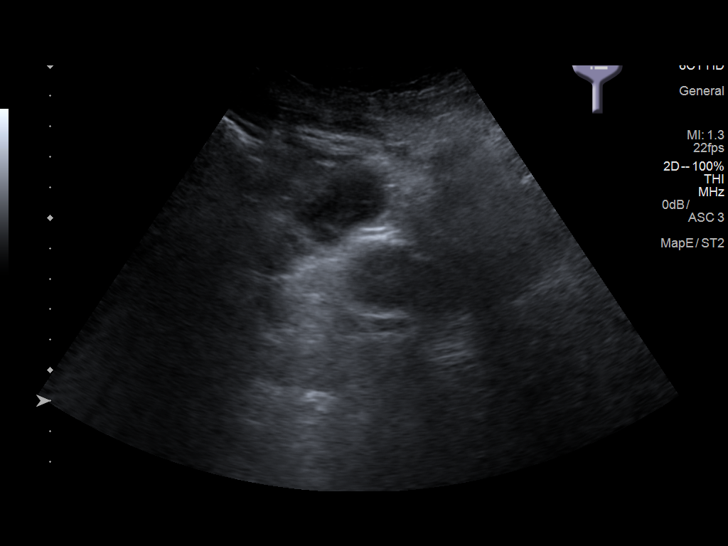

[14 of 18 positions shown; findings below may reference images not displayed]

FINDINGS: Right Kidney:

Length: 11.1 cm. Echogenicity and renal cortical thickness are
within normal limits. No mass, perinephric fluid, or hydronephrosis
visualized. No sonographically demonstrable calculus or
ureterectasis.

Left Kidney:

Patient refused to allow imaging of left flank.

Bladder:

Decompressed with Foley catheter.
IMPRESSION: Limited study as patient refused to allow imaging of the left flank.
Right kidney appears unremarkable. Urinary bladder decompressed with
Foley catheter and cannot be assessed.

## 2018-12-07 IMAGING — US US CHEST/MEDIASTINUM
1 series · 4 of 4 positions shown · non-contrast
Comparison: 10/28/2017

CLINICAL DATA: 70-year-old female with referral for thoracentesis.

EXAM:
CHEST ULTRASOUND

[Series 1: us chest/mediastinum · 0.25mm/px · 4 of 4 slices shown]
[im 1/4]
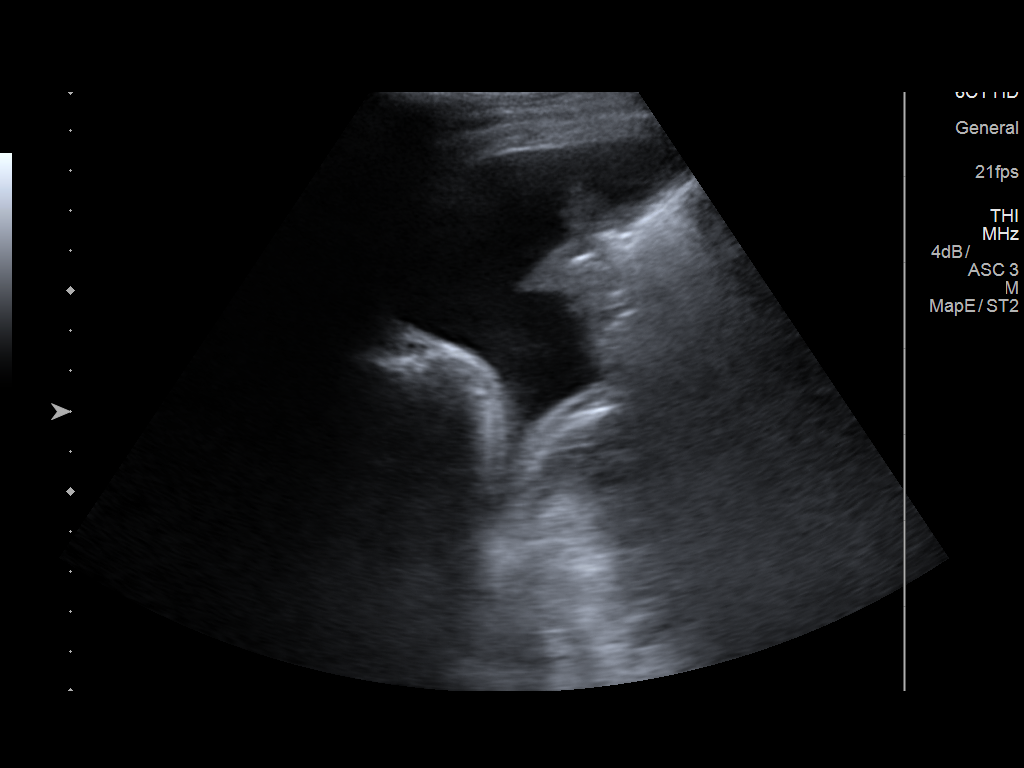
[im 2/4]
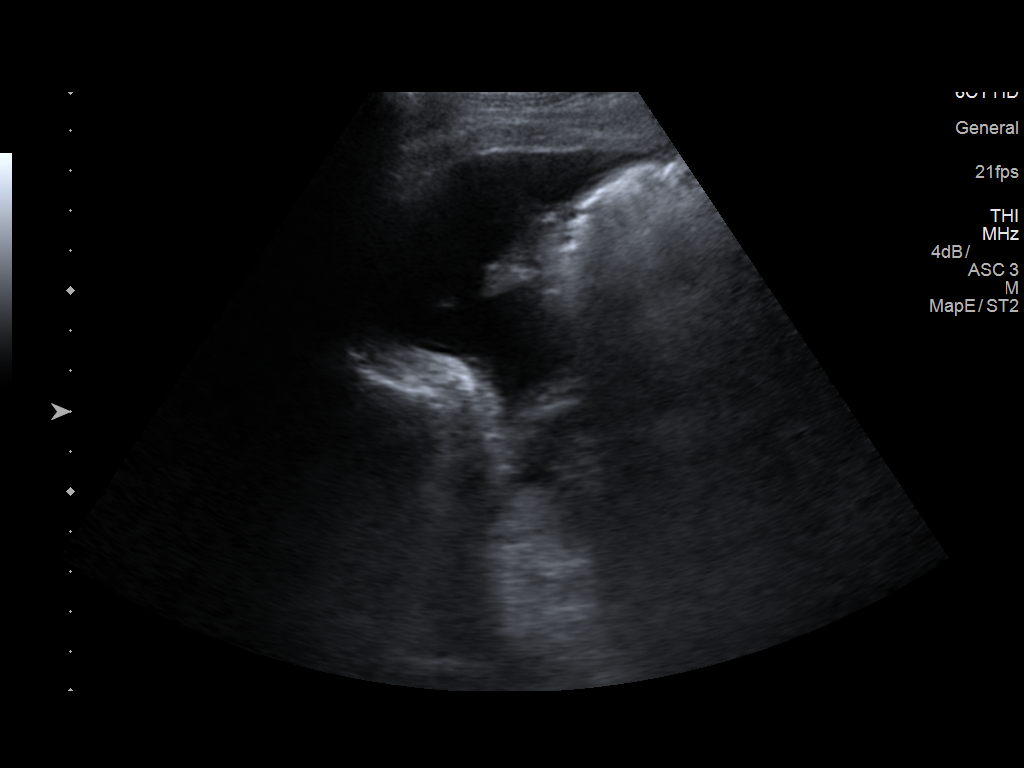
[im 3/4]
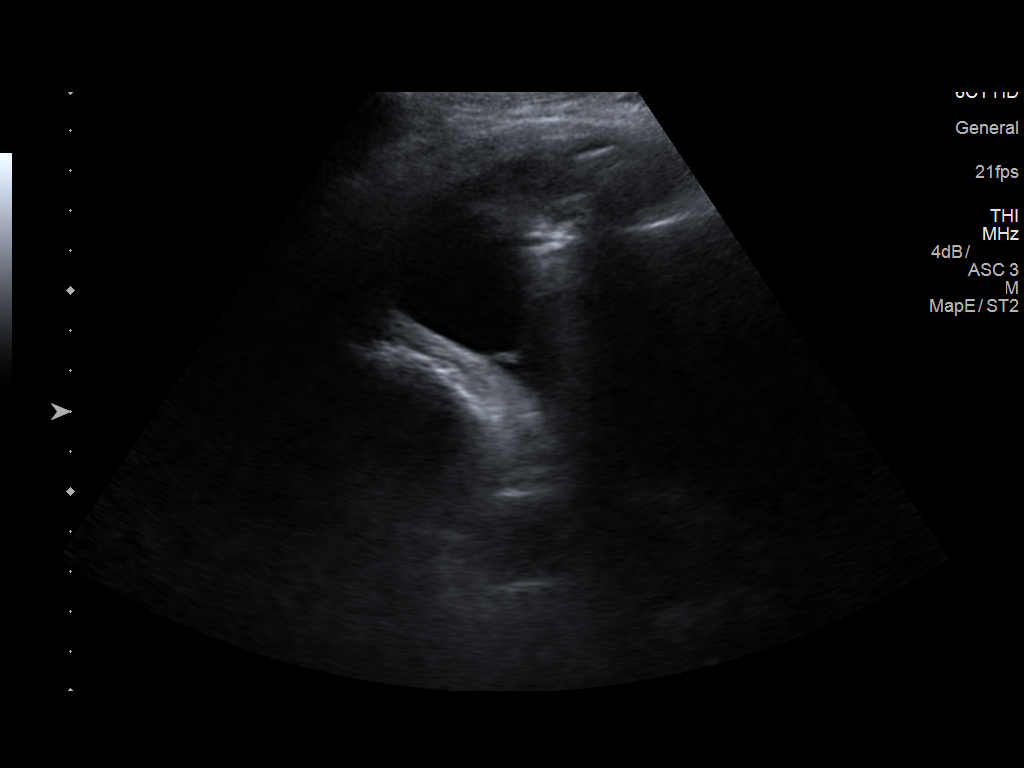
[im 4/4]
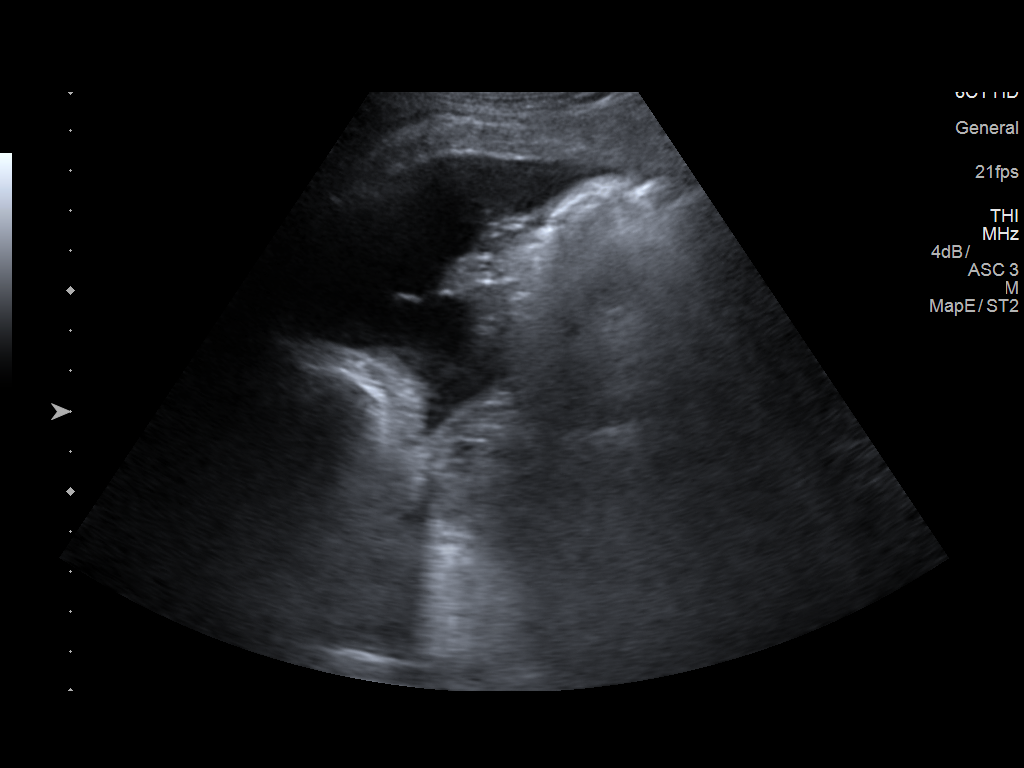

[4 of 4 positions shown; findings below may reference images not displayed]

FINDINGS: Ultrasound survey demonstrates small fluid at the right lung base.
Thoracentesis deferred at this time.
IMPRESSION: Ultrasound survey demonstrates small fluid at the right lung base.
